# Patient Record
Sex: Female | Born: 1965 | Race: Black or African American | Hispanic: No | Marital: Married | Smoking: Never smoker
Health system: Southern US, Community
[De-identification: ages and names within clinical notes are randomized; demographics above are authoritative.]

## PROBLEM LIST (undated history)

## (undated) ENCOUNTER — Emergency Department: Admission: RE | Discharge status: Absent without leave | Source: Home / Self Care

## (undated) DIAGNOSIS — E119 Type 2 diabetes mellitus without complications: Secondary | ICD-10-CM

## (undated) DIAGNOSIS — C801 Malignant (primary) neoplasm, unspecified: Secondary | ICD-10-CM

## (undated) DIAGNOSIS — R42 Dizziness and giddiness: Secondary | ICD-10-CM

## (undated) DIAGNOSIS — F32A Depression, unspecified: Secondary | ICD-10-CM

## (undated) DIAGNOSIS — N289 Disorder of kidney and ureter, unspecified: Secondary | ICD-10-CM

## (undated) DIAGNOSIS — A6000 Herpesviral infection of urogenital system, unspecified: Secondary | ICD-10-CM

## (undated) DIAGNOSIS — G8929 Other chronic pain: Secondary | ICD-10-CM

## (undated) DIAGNOSIS — K649 Unspecified hemorrhoids: Secondary | ICD-10-CM

## (undated) DIAGNOSIS — M1990 Unspecified osteoarthritis, unspecified site: Secondary | ICD-10-CM

## (undated) DIAGNOSIS — I10 Essential (primary) hypertension: Secondary | ICD-10-CM

## (undated) DIAGNOSIS — J45909 Unspecified asthma, uncomplicated: Secondary | ICD-10-CM

## (undated) DIAGNOSIS — F419 Anxiety disorder, unspecified: Secondary | ICD-10-CM

## (undated) DIAGNOSIS — E039 Hypothyroidism, unspecified: Secondary | ICD-10-CM

## (undated) DIAGNOSIS — C9110 Chronic lymphocytic leukemia of B-cell type not having achieved remission: Secondary | ICD-10-CM

## (undated) DIAGNOSIS — F329 Major depressive disorder, single episode, unspecified: Secondary | ICD-10-CM

## (undated) DIAGNOSIS — M549 Dorsalgia, unspecified: Secondary | ICD-10-CM

## (undated) HISTORY — DX: Other chronic pain: G89.29

## (undated) HISTORY — DX: Dizziness and giddiness: R42

## (undated) HISTORY — PX: ABDOMINAL HYSTERECTOMY: SHX81

## (undated) HISTORY — DX: Dorsalgia, unspecified: M54.9

## (undated) HISTORY — DX: Herpesviral infection of urogenital system, unspecified: A60.00

## (undated) HISTORY — DX: Unspecified asthma, uncomplicated: J45.909

## (undated) HISTORY — DX: Unspecified hemorrhoids: K64.9

## (undated) HISTORY — PX: OOPHORECTOMY: SHX86

---

## 2007-04-27 HISTORY — PX: LAPAROSCOPIC SUPRACERVICAL HYSTERECTOMY: SUR797

## 2007-04-27 HISTORY — PX: BILATERAL SALPINGOOPHORECTOMY: SHX1223

## 2014-12-26 DIAGNOSIS — M1990 Unspecified osteoarthritis, unspecified site: Secondary | ICD-10-CM | POA: Insufficient documentation

## 2014-12-26 DIAGNOSIS — G5603 Carpal tunnel syndrome, bilateral upper limbs: Secondary | ICD-10-CM | POA: Insufficient documentation

## 2014-12-26 DIAGNOSIS — E119 Type 2 diabetes mellitus without complications: Secondary | ICD-10-CM | POA: Insufficient documentation

## 2014-12-26 DIAGNOSIS — E039 Hypothyroidism, unspecified: Secondary | ICD-10-CM | POA: Insufficient documentation

## 2014-12-26 DIAGNOSIS — E782 Mixed hyperlipidemia: Secondary | ICD-10-CM | POA: Insufficient documentation

## 2014-12-26 DIAGNOSIS — I10 Essential (primary) hypertension: Secondary | ICD-10-CM | POA: Insufficient documentation

## 2014-12-27 ENCOUNTER — Other Ambulatory Visit: Payer: Self-pay | Admitting: Adult Health

## 2014-12-27 DIAGNOSIS — Z1231 Encounter for screening mammogram for malignant neoplasm of breast: Secondary | ICD-10-CM

## 2014-12-31 ENCOUNTER — Inpatient Hospital Stay: Payer: Medicare Other

## 2014-12-31 ENCOUNTER — Inpatient Hospital Stay: Payer: Medicare Other | Attending: Oncology | Admitting: Oncology

## 2014-12-31 ENCOUNTER — Encounter: Payer: Self-pay | Admitting: Oncology

## 2014-12-31 VITALS — BP 155/97 | HR 80 | Temp 97.2°F | Wt 264.1 lb

## 2014-12-31 DIAGNOSIS — Z809 Family history of malignant neoplasm, unspecified: Secondary | ICD-10-CM | POA: Insufficient documentation

## 2014-12-31 DIAGNOSIS — R599 Enlarged lymph nodes, unspecified: Secondary | ICD-10-CM

## 2014-12-31 DIAGNOSIS — E1165 Type 2 diabetes mellitus with hyperglycemia: Secondary | ICD-10-CM | POA: Insufficient documentation

## 2014-12-31 DIAGNOSIS — R161 Splenomegaly, not elsewhere classified: Secondary | ICD-10-CM | POA: Insufficient documentation

## 2014-12-31 DIAGNOSIS — F419 Anxiety disorder, unspecified: Secondary | ICD-10-CM | POA: Diagnosis not present

## 2014-12-31 DIAGNOSIS — Z5111 Encounter for antineoplastic chemotherapy: Secondary | ICD-10-CM | POA: Diagnosis not present

## 2014-12-31 DIAGNOSIS — J45909 Unspecified asthma, uncomplicated: Secondary | ICD-10-CM | POA: Diagnosis not present

## 2014-12-31 DIAGNOSIS — D72829 Elevated white blood cell count, unspecified: Secondary | ICD-10-CM | POA: Diagnosis not present

## 2014-12-31 DIAGNOSIS — K429 Umbilical hernia without obstruction or gangrene: Secondary | ICD-10-CM | POA: Insufficient documentation

## 2014-12-31 DIAGNOSIS — G8929 Other chronic pain: Secondary | ICD-10-CM | POA: Diagnosis not present

## 2014-12-31 DIAGNOSIS — Z803 Family history of malignant neoplasm of breast: Secondary | ICD-10-CM | POA: Insufficient documentation

## 2014-12-31 DIAGNOSIS — J352 Hypertrophy of adenoids: Secondary | ICD-10-CM | POA: Diagnosis not present

## 2014-12-31 DIAGNOSIS — I10 Essential (primary) hypertension: Secondary | ICD-10-CM | POA: Diagnosis not present

## 2014-12-31 DIAGNOSIS — M549 Dorsalgia, unspecified: Secondary | ICD-10-CM | POA: Insufficient documentation

## 2014-12-31 DIAGNOSIS — C9110 Chronic lymphocytic leukemia of B-cell type not having achieved remission: Secondary | ICD-10-CM | POA: Insufficient documentation

## 2014-12-31 DIAGNOSIS — R918 Other nonspecific abnormal finding of lung field: Secondary | ICD-10-CM | POA: Insufficient documentation

## 2014-12-31 DIAGNOSIS — M5030 Other cervical disc degeneration, unspecified cervical region: Secondary | ICD-10-CM | POA: Diagnosis not present

## 2014-12-31 DIAGNOSIS — R42 Dizziness and giddiness: Secondary | ICD-10-CM | POA: Diagnosis not present

## 2014-12-31 DIAGNOSIS — Z79899 Other long term (current) drug therapy: Secondary | ICD-10-CM | POA: Diagnosis not present

## 2014-12-31 DIAGNOSIS — D649 Anemia, unspecified: Secondary | ICD-10-CM

## 2014-12-31 LAB — CBC WITH DIFFERENTIAL/PLATELET
Basophils Absolute: 0.1 10*3/uL (ref 0–0.1)
Basophils Relative: 0 %
EOS ABS: 0.3 10*3/uL (ref 0–0.7)
HCT: 35.1 % (ref 35.0–47.0)
Hemoglobin: 11.1 g/dL — ABNORMAL LOW (ref 12.0–16.0)
LYMPHS ABS: 59.8 10*3/uL — AB (ref 1.0–3.6)
MCH: 26.7 pg (ref 26.0–34.0)
MCHC: 31.7 g/dL — AB (ref 32.0–36.0)
MCV: 84.4 fL (ref 80.0–100.0)
MONO ABS: 1.5 10*3/uL — AB (ref 0.2–0.9)
Neutro Abs: 7.5 10*3/uL — ABNORMAL HIGH (ref 1.4–6.5)
Neutrophils Relative %: 11 %
PLATELETS: 267 10*3/uL (ref 150–440)
RBC: 4.16 MIL/uL (ref 3.80–5.20)
RDW: 14.7 % — AB (ref 11.5–14.5)
WBC: 69.1 10*3/uL — AB (ref 3.6–11.0)

## 2014-12-31 LAB — COMPREHENSIVE METABOLIC PANEL
ALT: 30 U/L (ref 14–54)
ANION GAP: 6 (ref 5–15)
AST: 32 U/L (ref 15–41)
Albumin: 4.1 g/dL (ref 3.5–5.0)
Alkaline Phosphatase: 76 U/L (ref 38–126)
BUN: 10 mg/dL (ref 6–20)
CHLORIDE: 104 mmol/L (ref 101–111)
CO2: 24 mmol/L (ref 22–32)
CREATININE: 0.72 mg/dL (ref 0.44–1.00)
Calcium: 9 mg/dL (ref 8.9–10.3)
Glucose, Bld: 249 mg/dL — ABNORMAL HIGH (ref 65–99)
POTASSIUM: 4 mmol/L (ref 3.5–5.1)
SODIUM: 134 mmol/L — AB (ref 135–145)
Total Bilirubin: 0.6 mg/dL (ref 0.3–1.2)
Total Protein: 7.3 g/dL (ref 6.5–8.1)

## 2014-12-31 MED ORDER — LORAZEPAM 1 MG PO TABS: 1.0000 mg | ORAL_TABLET | Freq: Once | ORAL | Status: DC

## 2015-01-01 NOTE — Progress Notes (Signed)
[facility]  [phone] [phone]  ID: Kandy Garrison OB: 1965-06-07  MR#: [id number]  [id number]  Patient Care Team: Minette Headland, NP as PCP - General (Hematology and Oncology)  CHIEF COMPLAINT:  Chief Complaint  Patient presents with  . New Evaluation  . Lymphadenopathy    INTERVAL HISTORY: Patient is a 49 year old female who recently moved to [place] from [place] and had baseline blood work while establishing care with primary care physician. She is found to have a significantly elevated white blood cell count. Patient states she remembers her white blood cell count being elevated several years ago. She also has new axillary and neck lymphadenopathy. She is anxious, but otherwise feels well. She has no neurologic complaints. She denies any fevers, night sweats, or weight loss. She has no chest pain or shortness of breath. She denies any nausea, vomiting, constipation, or diarrhea. She has no melanoma or hematochezia. She has no urinary complaints. Patient otherwise feels well and offers no further specific complaints.  REVIEW OF SYSTEMS:   Review of Systems  Constitutional: Negative for fever, weight loss and malaise/fatigue.  Respiratory: Negative.   Cardiovascular: Negative.   Gastrointestinal: Negative.   Musculoskeletal: Negative.   Neurological: Negative for weakness.  Psychiatric/Behavioral: The patient is nervous/anxious.     As per HPI. Otherwise, a complete review of systems is negatve.  PAST MEDICAL HISTORY: Past Medical History  Diagnosis Date  . Asthma   . Vertigo   . Chronic back pain     PAST SURGICAL HISTORY: Past Surgical History  Procedure Laterality Date  . Abdominal hysterectomy      FAMILY HISTORY Family History  Problem Relation Age of Onset  . Cancer Paternal Aunt        ADVANCED DIRECTIVES:    HEALTH MAINTENANCE: Social History  Substance Use Topics  . Smoking status: Never  Smoker   . Smokeless tobacco: Never Used  . Alcohol Use: 0.6 oz/week    1 Cans of beer per week     Colonoscopy:  PAP:  Bone density:  Lipid panel:  No Known Allergies  Current Outpatient Prescriptions  Medication Sig Dispense Refill  . cyclobenzaprine (FLEXERIL) 5 MG tablet Take by mouth.    . Diclofenac Sodium CR 100 MG 24 hr tablet Take by mouth.    . diltiazem (TIAZAC) 180 MG 24 hr capsule Take 180 mg by mouth daily.    Marland Kitchen ezetimibe (ZETIA) 10 MG tablet Take by mouth.    . fluticasone (FLONASE) 50 MCG/ACT nasal spray Place into the nose.    . furosemide (LASIX) 20 MG tablet Take by mouth.    . gabapentin (NEURONTIN) 600 MG tablet Take 600 mg by mouth at bedtime.    . irbesartan (AVAPRO) 300 MG tablet Take by mouth.    . levothyroxine (SYNTHROID, LEVOTHROID) 150 MCG tablet Take by mouth.    . lidocaine (LIDODERM) 5 % Place onto the skin.    . metFORMIN (GLUCOPHAGE) 1000 MG tablet Take by mouth.    . rosuvastatin (CRESTOR) 10 MG tablet Take by mouth.    . sitaGLIPtin (JANUVIA) 100 MG tablet Take by mouth.    Marland Kitchen LORazepam (ATIVAN) 1 MG tablet Take 1 tablet (1 mg total) by mouth once. Repeat x1 if necessary 30-60 mins prior to CT scan 2 tablet 0   No current facility-administered medications for this visit.    OBJECTIVE: Filed Vitals:   12/31/14 1100  BP: 155/97  Pulse: 80  Temp: 97.2  F (36.2 C)     There is no height on file to calculate BMI.    ECOG FS:0 - Asymptomatic  General: Well-developed, well-nourished, no acute distress. Eyes: Pink conjunctiva, anicteric sclera. HEENT: Normocephalic, moist mucous membranes, clear oropharnyx. Lungs: Clear to auscultation bilaterally. Heart: Regular rate and rhythm. No rubs, murmurs, or gallops. Abdomen: Soft, nontender, nondistended. No organomegaly noted, normoactive bowel sounds. Musculoskeletal: No edema, cyanosis, or clubbing. Neuro: Alert, answering all questions appropriately. Cranial nerves grossly intact. Skin: No  rashes or petechiae noted. Psych: Normal affect. Lymphatics: Minimally palpable cervical, calvicular, and axillary LAD.   LAB RESULTS:  Lab Results  Component Value Date   NA 134* 12/31/2014   K 4.0 12/31/2014   CL 104 12/31/2014   CO2 24 12/31/2014   GLUCOSE 249* 12/31/2014   BUN 10 12/31/2014   CREATININE 0.72 12/31/2014   CALCIUM 9.0 12/31/2014   PROT 7.3 12/31/2014   ALBUMIN 4.1 12/31/2014   AST 32 12/31/2014   ALT 30 12/31/2014   ALKPHOS 76 12/31/2014   BILITOT 0.6 12/31/2014   GFRNONAA >60 12/31/2014   GFRAA >60 12/31/2014    Lab Results  Component Value Date   WBC 69.1* 12/31/2014   NEUTROABS 7.5* 12/31/2014   HGB 11.1* 12/31/2014   HCT 35.1 12/31/2014   MCV 84.4 12/31/2014   PLT 267 12/31/2014     STUDIES: No results found.  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  PLAN:    1. CLL: Patient has a significantly elevated white blood cell count with lymphocyte predominance. Patient states less than one year ago in [place] she remembers her white count being approximately 20. She also has new lymphadenopathy. Will send peripheral blood flow cytometry to confirm the diagnosis. We will also get CT scans of the neck, chest, abdomen, and pelvis to further evaluate her lymphadenopathy. Patient will return to clinic in 1 week to discuss her results and any treatment plan if necessary. 2. Anxiety: Patient was given a prescription for Ativan to take prior to her CT scan. 3. 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  Patient expressed understanding and was in agreement with this plan. She also understands that She can call clinic at any time with any questions, concerns, or complaints.   No matching staging information was found for the patient.  Lloyd Huger, MD   01/01/2015 9:14 AM

## 2015-01-02 LAB — COMP PANEL: LEUKEMIA/LYMPHOMA: Immunophenotypic Profile: 81

## 2015-01-03 ENCOUNTER — Ambulatory Visit
Admission: RE | Admit: 2015-01-03 | Discharge: 2015-01-03 | Disposition: A | Discharge status: Home / Self Care | Payer: Medicare Other | Source: Ambulatory Visit | Attending: Oncology | Admitting: Oncology

## 2015-01-03 ENCOUNTER — Ambulatory Visit
Admission: RE | Admit: 2015-01-03 | Discharge: 2015-01-03 | Disposition: A | Discharge status: Home / Self Care | Payer: Medicare Other | Source: Ambulatory Visit | Attending: Family Medicine | Admitting: Family Medicine

## 2015-01-03 DIAGNOSIS — C9110 Chronic lymphocytic leukemia of B-cell type not having achieved remission: Secondary | ICD-10-CM

## 2015-01-03 DIAGNOSIS — M4802 Spinal stenosis, cervical region: Secondary | ICD-10-CM | POA: Insufficient documentation

## 2015-01-03 DIAGNOSIS — M2578 Osteophyte, vertebrae: Secondary | ICD-10-CM | POA: Insufficient documentation

## 2015-01-03 DIAGNOSIS — J352 Hypertrophy of adenoids: Secondary | ICD-10-CM | POA: Diagnosis not present

## 2015-01-03 DIAGNOSIS — R911 Solitary pulmonary nodule: Secondary | ICD-10-CM | POA: Insufficient documentation

## 2015-01-03 DIAGNOSIS — Z1231 Encounter for screening mammogram for malignant neoplasm of breast: Secondary | ICD-10-CM | POA: Insufficient documentation

## 2015-01-03 HISTORY — DX: Type 2 diabetes mellitus without complications: E11.9

## 2015-01-03 HISTORY — DX: Essential (primary) hypertension: I10

## 2015-01-03 IMAGING — CT CT NECK W/ CM
4 of 5 series · 14 of 33 positions shown, 16 images · IV contrast (omnipaque)
Comparison: No prior

CLINICAL DATA: 49-year-old female with suspected leukemia.
Bilateral neck masses for 3 months. Initial encounter.

EXAM:
CT NECK WITH CONTRAST
TECHNIQUE: Multidetector CT imaging of the neck was performed using the
standard protocol following the bolus administration of intravenous
contrast.
CONTRAST:  125mL OMNIPAQUE IOHEXOL 300 MG/ML SOLN in conjunction
with contrast enhanced imaging of the chest, abdomen, and pelvis
reported separately.

[Series 9: axial · axial · 0.61mm/px · z∈[-754,-590]mm · 4 of 138 slices shown, 5 images]
[im 28/138  soft-tissue]
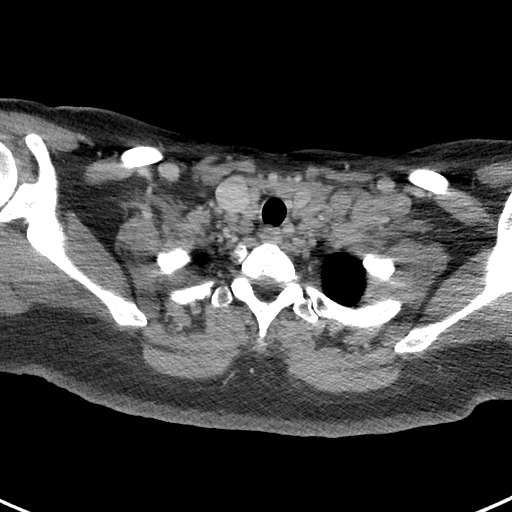
[im 28/138  bone]
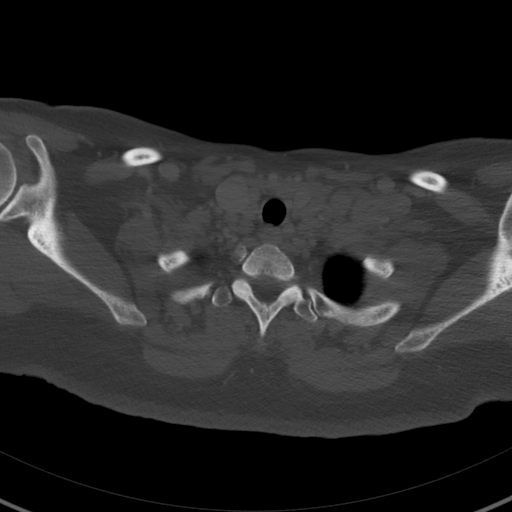
[im 55/138  bone]
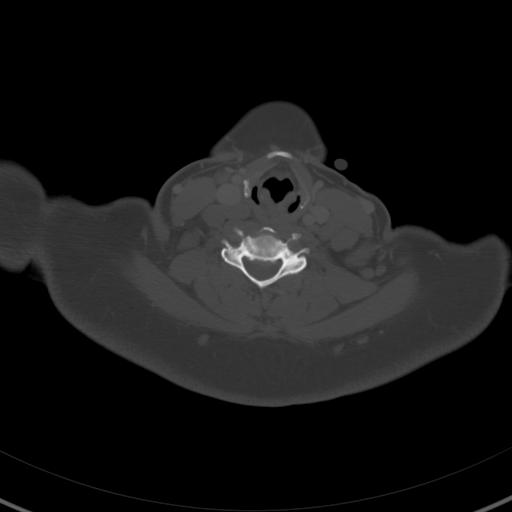
[im 83/138  bone]
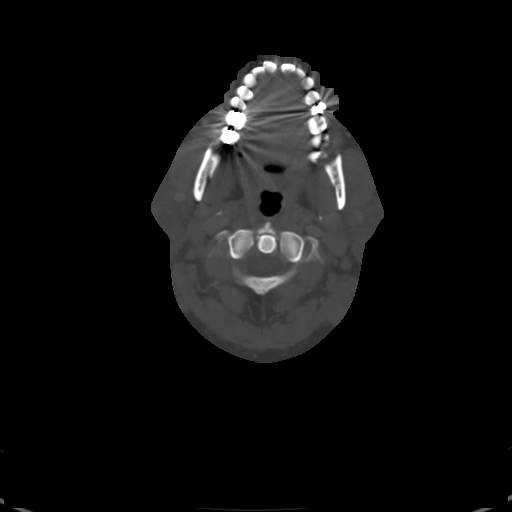
[im 110/138  bone]
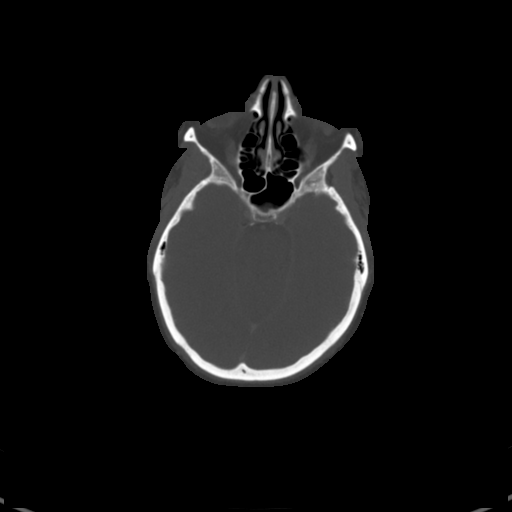

[Series 11: sag neck · sagittal · 0.46mm/px · 5 of 159 slices shown, 6 images]
[im 53/159  bone]
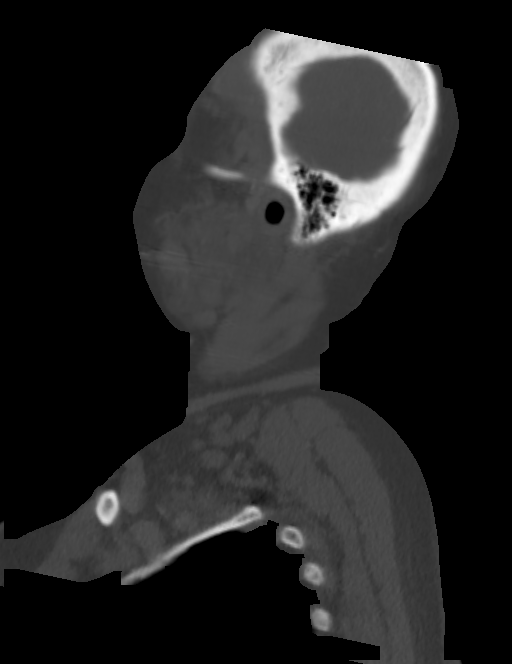
[im 66/159  bone]
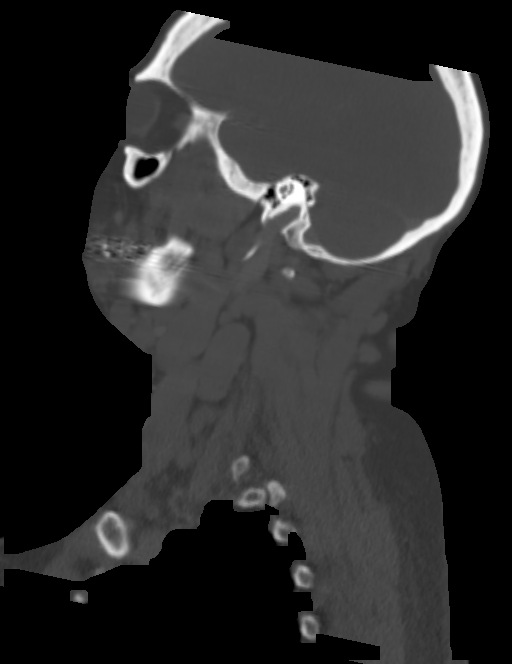
[im 80/159  soft-tissue]
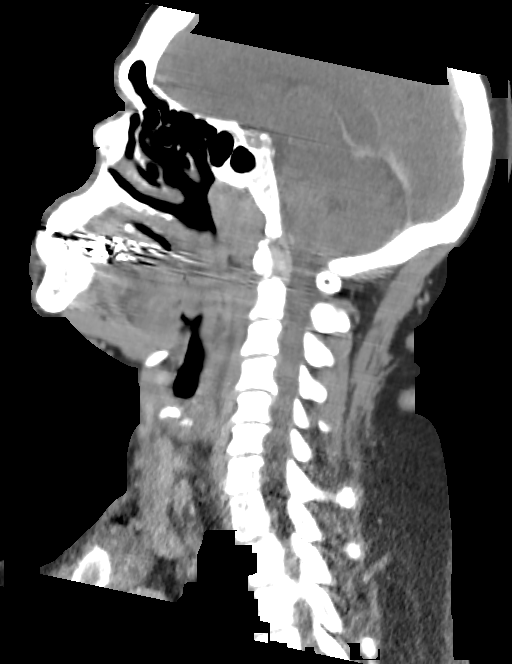
[im 80/159  bone]
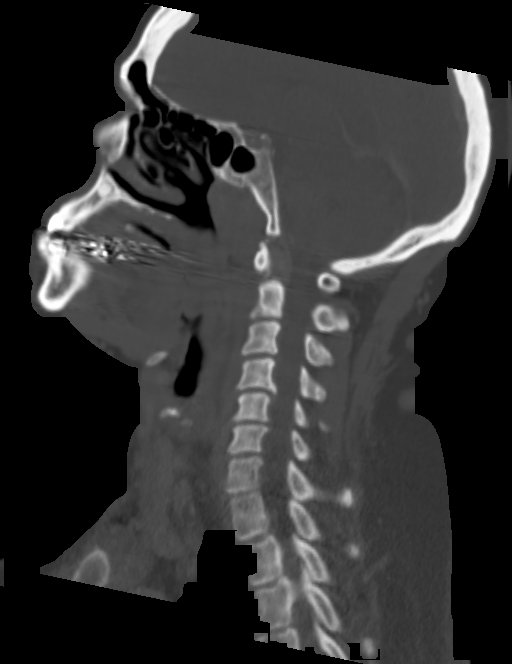
[im 93/159  bone]
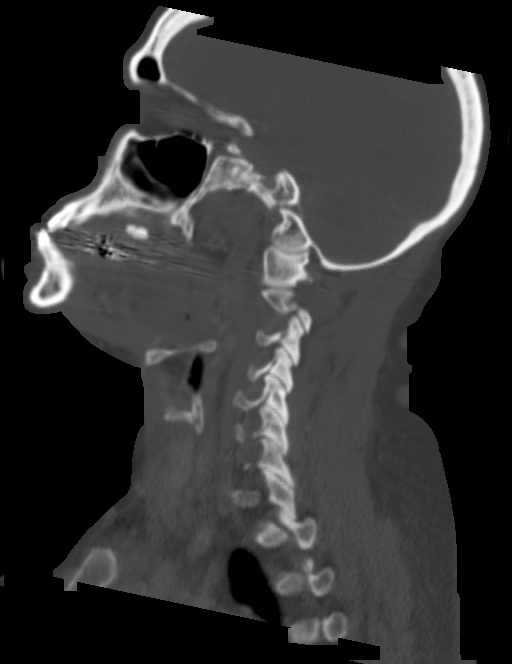
[im 106/159  bone]
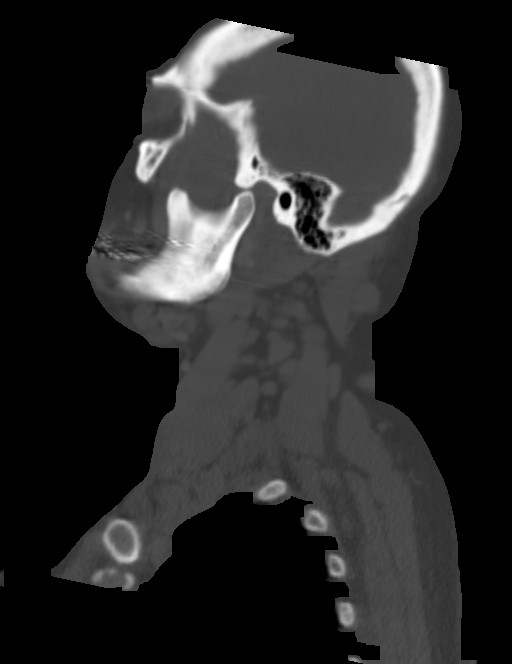

[Series 12: cor neck · coronal · 0.57mm/px · 3 of 116 slices shown]
[im 24/116  bone]
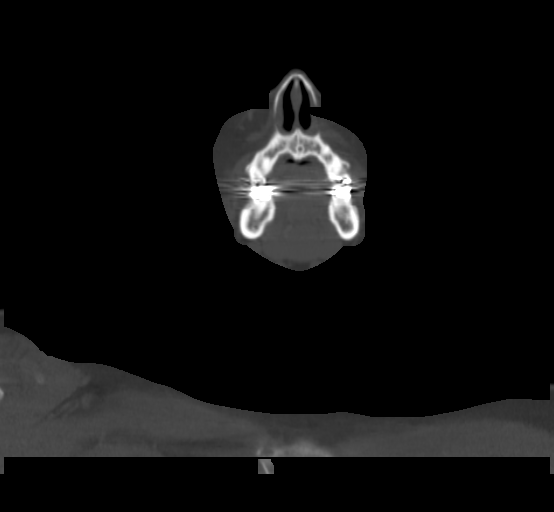
[im 47/116  bone]
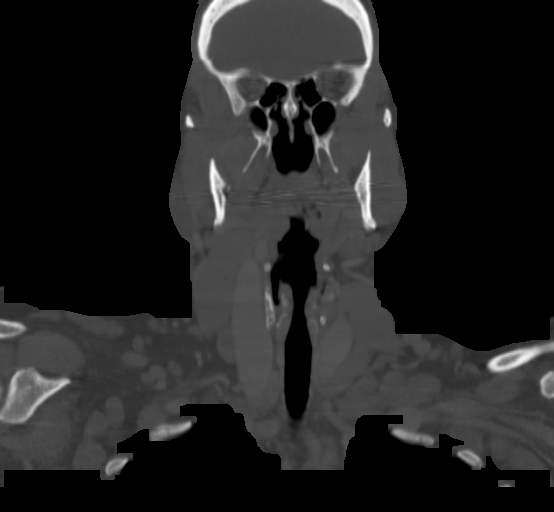
[im 70/116  bone]
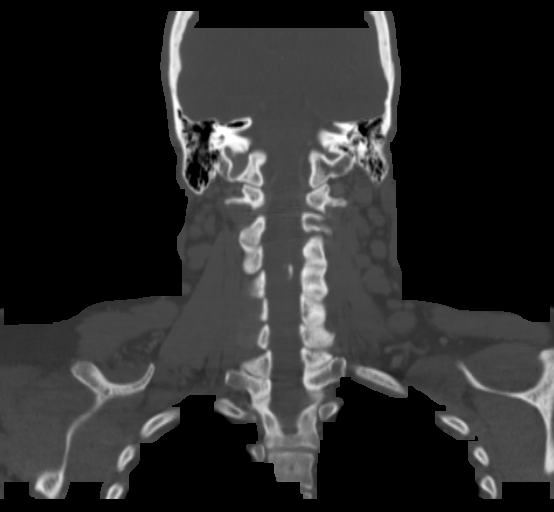

[Series 13: ax oropharynx · axial · 0.43mm/px · z∈[-785,-733]mm · 2 of 130 slices shown]
[im 26/130  bone]
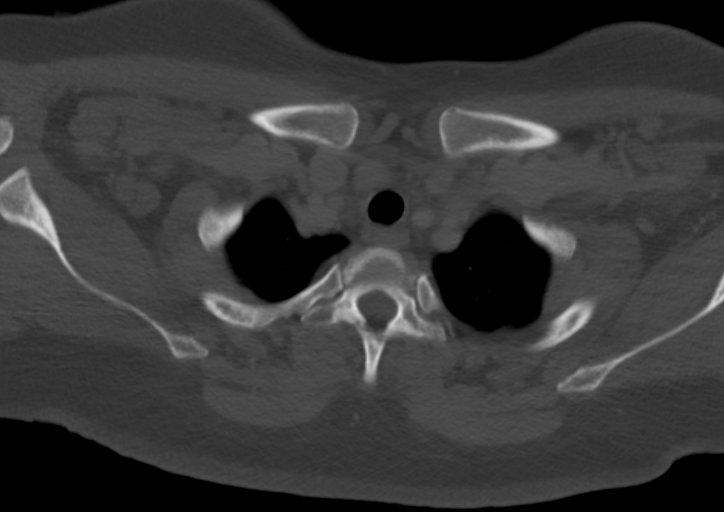
[im 52/130  bone]
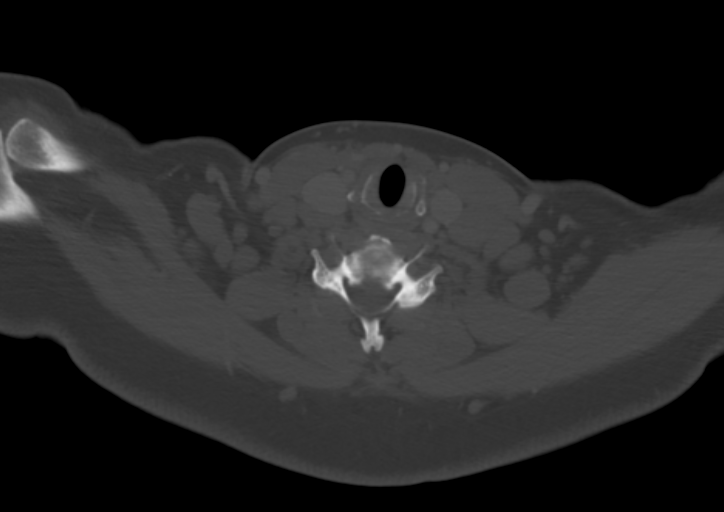

[14 of 33 positions shown; findings below may reference images not displayed]

FINDINGS: Pharynx and larynx: Negative larynx. Pharyngeal contours appear
symmetric. There is adenoid hypertrophy which seems advanced for
age. Palatine and lingual tonsils have a more normal appearance.
Negative parapharyngeal spaces. Negative retropharyngeal space.

Salivary glands: Submandibular glands, sublingual space, and parotid
glands are within normal limits. There are increased bilateral
parotid space lymph nodes, see details below.

Thyroid: Negative.

Lymph nodes: Diffuse bilateral lymphadenopathy. All nodal levels are
affected. Enlarged nodes in the right neck individually measure up
to 15 x 24 by 40 mm (AP by transverse by CC), while enlarged nodes
on the left individually measure up to 28 x 25 x 36 mm. Extensive
bilateral axillary and left greater than right thoracic inlet
lymphadenopathy also noted.

Vascular: Major vascular structures in the neck and at the skullbase
are patent.

Limited intracranial: Negative.

Visualized orbits: Negative.

Mastoids and visualized paranasal sinuses: Trace right maxillary
sinus mucosal thickening. Other paranasal sinuses are clear.

Skeleton: Degenerative changes in the cervical spine with bulky disc
osteophyte complex versus posterior longitudinal ligament
ossification (e.g. C3-C4 series 9, image 76). Subsequent multilevel
cervical spinal stenosis. No acute or suspicious osseous lesion
identified. Incidental torus palatini S.

Upper chest: Reported separately.
IMPRESSION: 1. Diffuse bilateral cervical lymphadenopathy in keeping with
lymphoma/leukemia. Adenoid hypertrophy greater than expected for age
might also reflect involvement by lymphoproliferative disease.
2. CT Chest, Abdomen and Pelvis from today reported separately.
3. Cervical spine degeneration with bulky disc osteophyte
degeneration and/or ossification of the posterior longitudinal
ligament (OPLL) resulting in multilevel cervical spinal stenosis.

## 2015-01-03 IMAGING — CT CT CHEST W/ CM
2 of 5 series · 14 of 46 positions shown, 16 images · IV contrast (omnipaque)
Comparison: Neck CT dictated same date, separate report. No prior
CT chest abdomen, or pelvis for comparison.

CLINICAL DATA: Weakness, bilateral neck masses for 2-3 months.
Patient had difficulty holding still for the examination.

EXAM:
CT CHEST, ABDOMEN, AND PELVIS WITH CONTRAST
TECHNIQUE: Multidetector CT imaging of the chest, abdomen and pelvis was
performed following the standard protocol during bolus
administration of intravenous contrast.
CONTRAST:  125mL OMNIPAQUE IOHEXOL 300 MG/ML  SOLN

[Series 3: cap with · axial · 0.81mm/px · z∈[-1312,-697]mm · 11 of 139 slices shown, 13 images]
[im 8/139  soft-tissue]
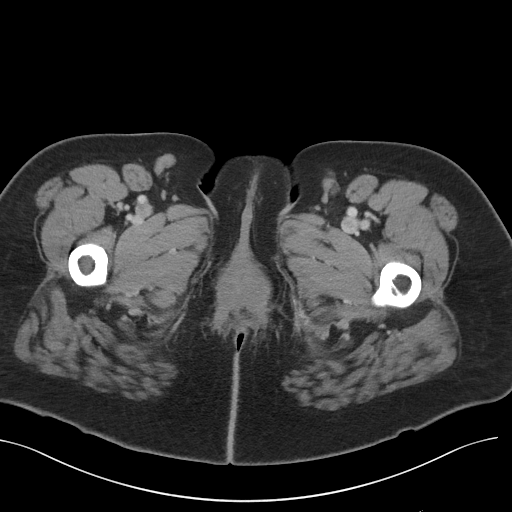
[im 8/139  bone]
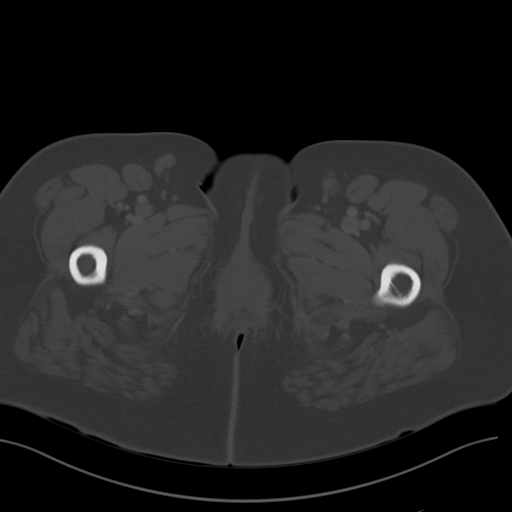
[im 24/139  soft-tissue]
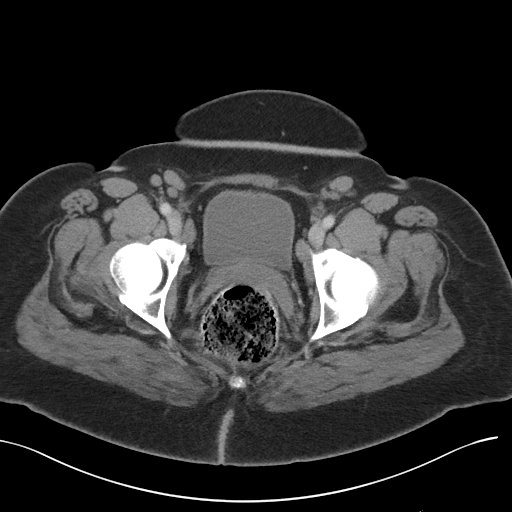
[im 31/139  soft-tissue]
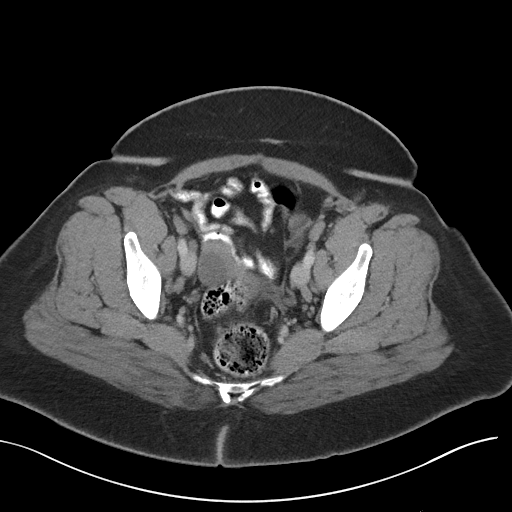
[im 47/139  soft-tissue]
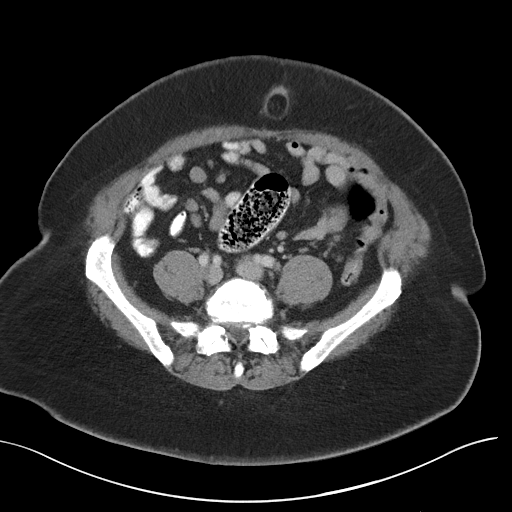
[im 54/139  soft-tissue]
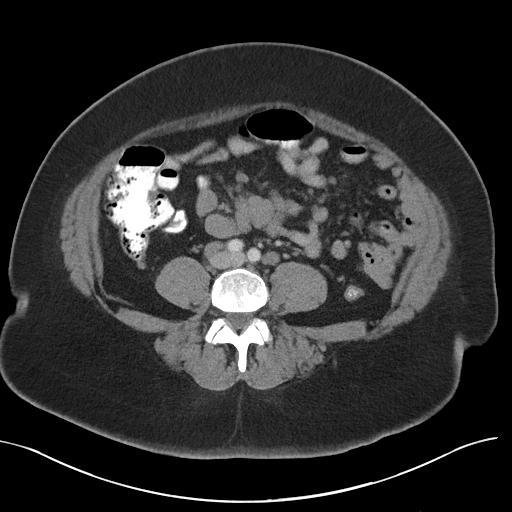
[im 70/139  soft-tissue]
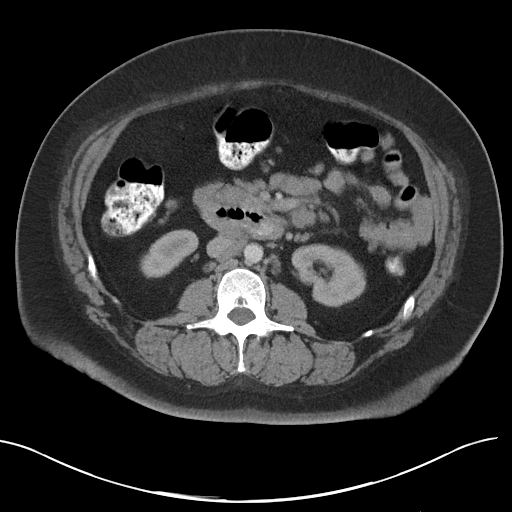
[im 85/139  soft-tissue]
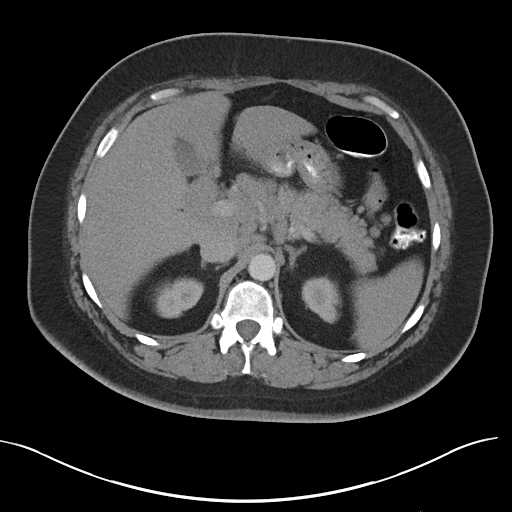
[im 93/139  soft-tissue]
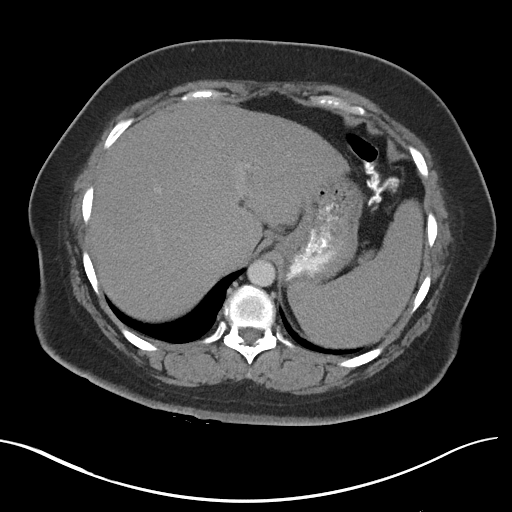
[im 108/139  soft-tissue]
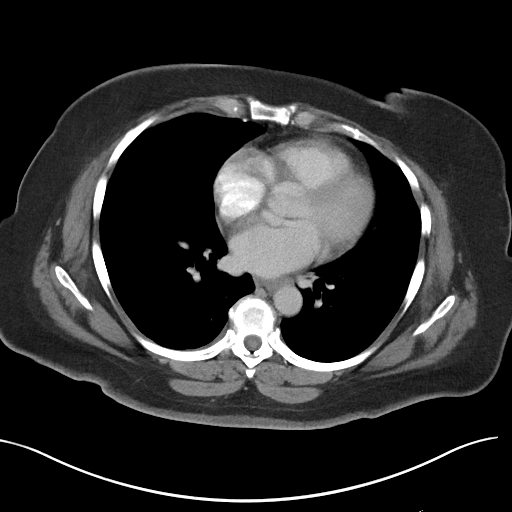
[im 108/139  bone]
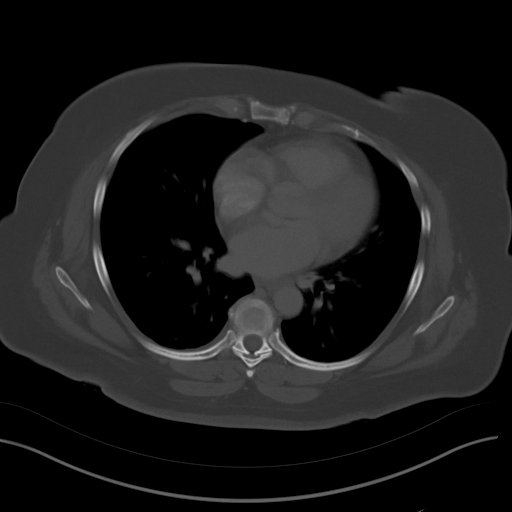
[im 116/139  soft-tissue]
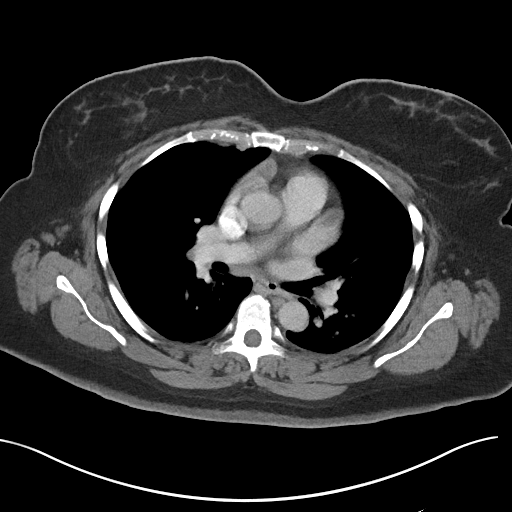
[im 131/139  soft-tissue]
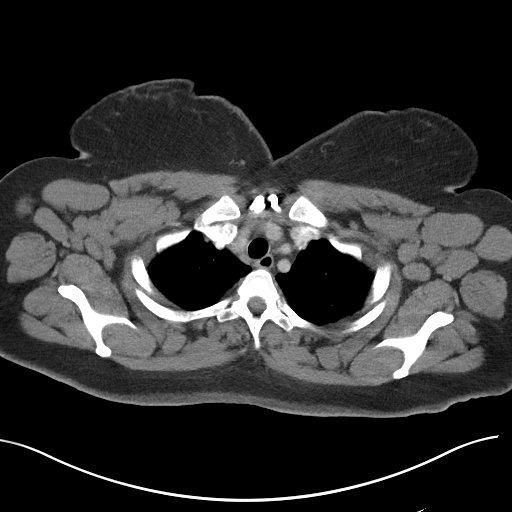

[Series 7: cor cap with · coronal · 0.85mm/px · 3 of 190 slices shown]
[im 64/190  soft-tissue]
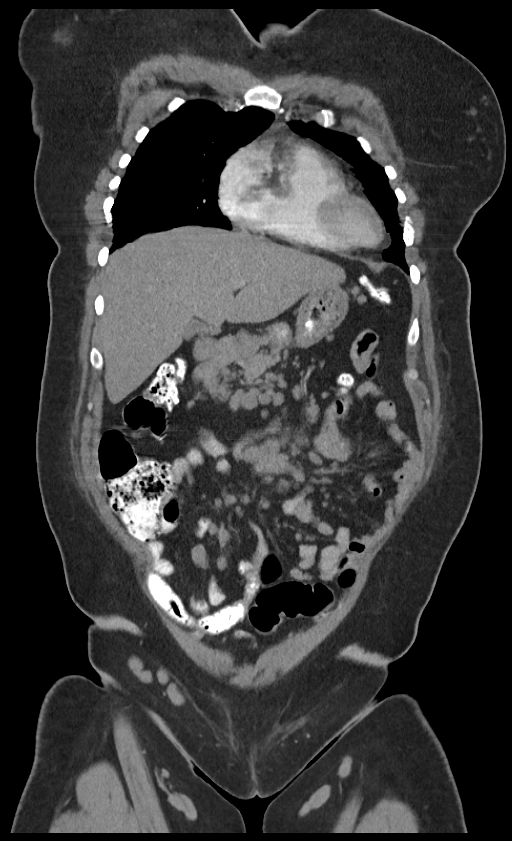
[im 85/190  soft-tissue]
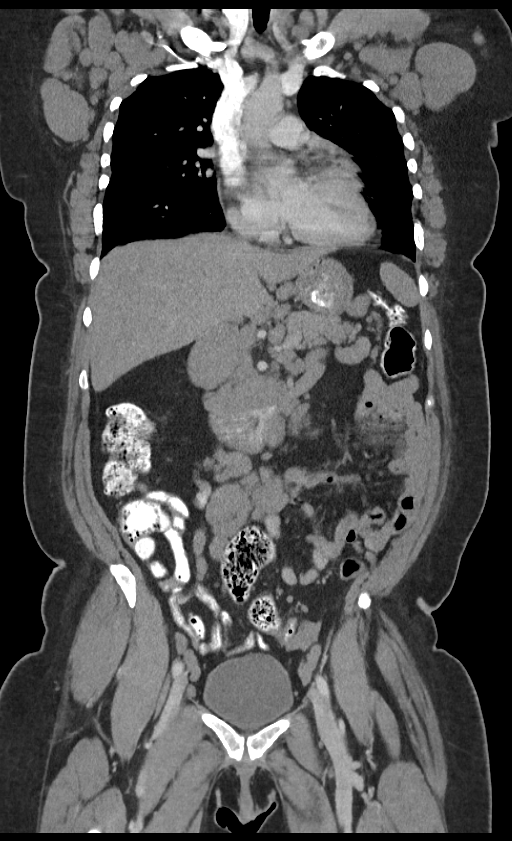
[im 106/190  soft-tissue]
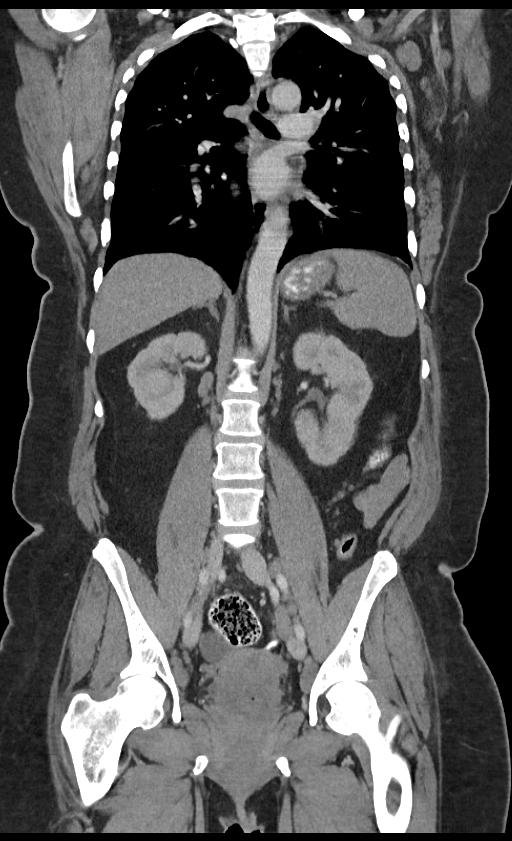

[14 of 46 positions shown; findings below may reference images not displayed]

FINDINGS: CT CHEST FINDINGS

Mediastinum/Nodes: Mild cardiac motion artifact. Heart size is
normal. Trace pericardial fluid.

Bulky bilateral axillary lymphadenopathy, representative right
axillary lymph node measuring 3.5 cm short axis diameter image 19
and representative left axillary lymph node short axis measuring
cm image 11.

Small AP window lymph nodes measure 4 mm and smaller. No hilar,
pretracheal, or subcarinal lymphadenopathy.

Great vessels are normal in caliber.

Lungs/Pleura: 6 mm pulmonary nodule in the right upper lobe image
20. No other measurable nodule, mass, or consolidation. Central
airways are patent.

Upper abdomen: Please see detailed report below.

Musculoskeletal: No acute osseous abnormality.

CT ABDOMEN AND PELVIS FINDINGS

Lower chest:  Please see dedicated report above.

Hepatobiliary: Liver and gallbladder appear unremarkable.

Pancreas: Normal

Spleen: Mild splenomegaly is identified, 13.4 cm image 48, without
focal abnormality.

Adrenals/Urinary Tract: Adrenal glands and kidneys are unremarkable.
No hydroureteronephrosis. Pelvic phleboliths are noted but no
radiopaque renal or ureteral calculus is identified. Bladder is
normal.

Stomach/Bowel: Normal appendix. No bowel wall thickening or focal
segmental dilatation is identified. A few colonic diverticuli are
identified without evidence for diverticulitis.

Vascular/Lymphatic: Multi station lymphadenopathy is noted as
follows:

Gastrohepatic node, 3.6 cm image 61.

Aortocaval node, 1.1 cm image 67.

Diffuse mesenteric lymphadenopathy, largest right lower quadrant
cm image 86.

Pelvic sidewall lymphadenopathy, largest 1.2 cm on the left image
113.

Inguinal lymphadenopathy, largest 2.1 cm on the left image 121.

No aortic aneurysm.

Other: Fat containing umbilical hernia.  No free air or fluid.

Evidence of hysterectomy.  Ovaries appear normal.

Musculoskeletal: No acute osseous abnormality or lytic or sclerotic
osseous lesion. L5-S1 disc degenerative change noted.
IMPRESSION: Multi station axillary, gastrohepatic, retroperitoneal, mesenteric,
pelvic, and inguinal lymphadenopathy as above. Findings are highly
suspicious for lymphoproliferative disorder such as lymphoma.

6 mm right upper lobe pulmonary parenchymal nodule. If the patient
is at high risk for bronchogenic carcinoma, follow-up chest CT at
6-12 months is recommended. If the patient is at low risk for
bronchogenic carcinoma, follow-up chest CT at 12 months is
recommended. This recommendation follows the consensus statement:
Guidelines for Management of Small Pulmonary Nodules Detected on CT
Scans: A Statement from the [HOSPITAL] as published in

## 2015-01-03 IMAGING — MG MM DIGITAL SCREENING BILAT W/ CAD
6 series · 6 of 6 positions shown · non-contrast
Comparison: No prior mammograms available for comparison.

CLINICAL DATA: Screening. Patient with known CLL.

EXAM:
DIGITAL SCREENING BILATERAL MAMMOGRAM WITH CAD

[R MLO]
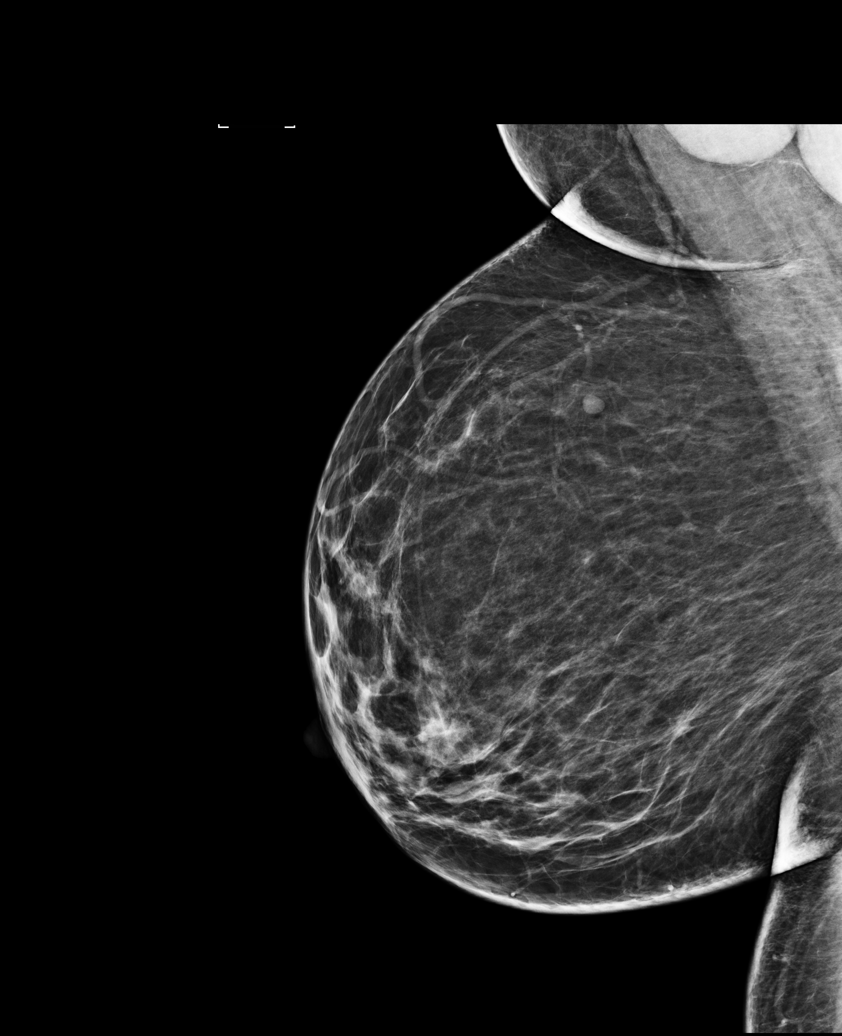

[R CC]
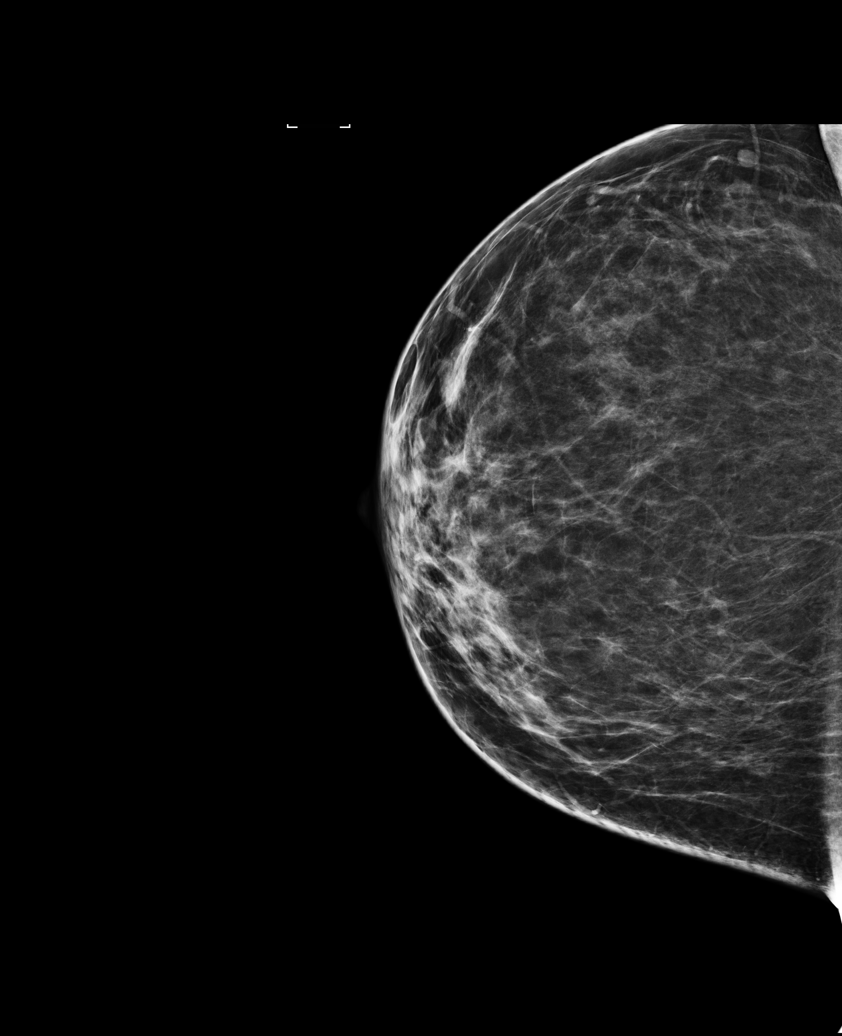

[L XCCL]
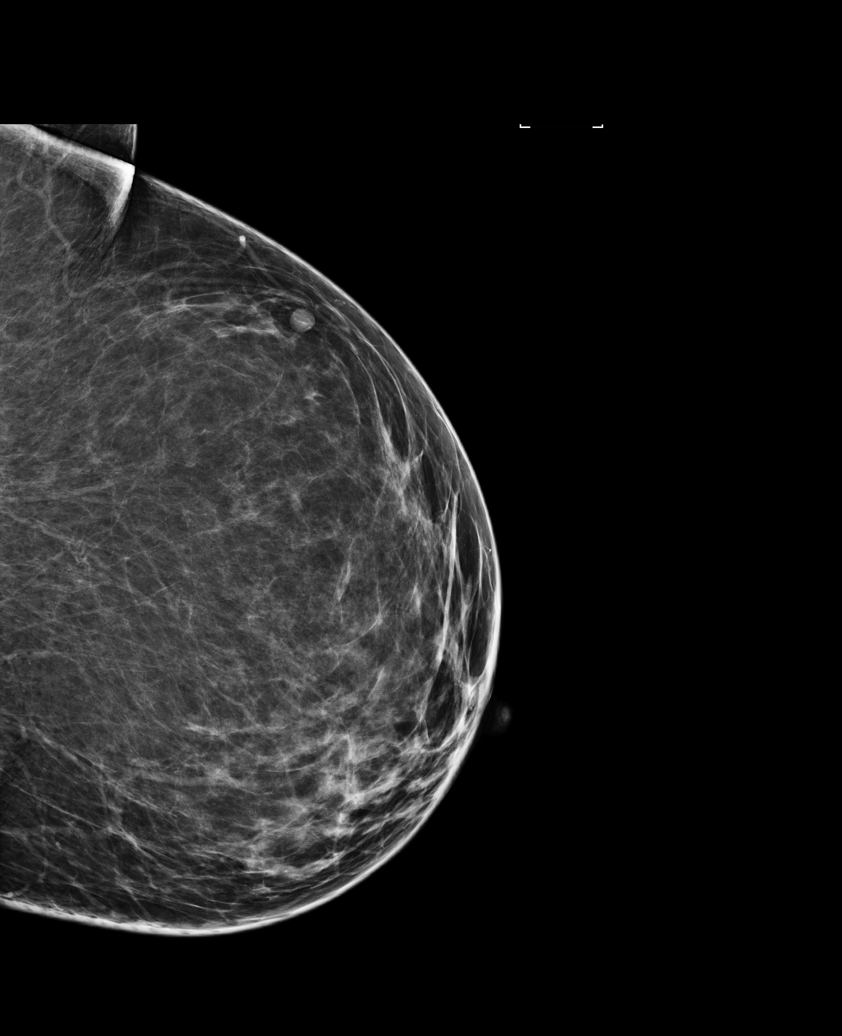

[L MLO]
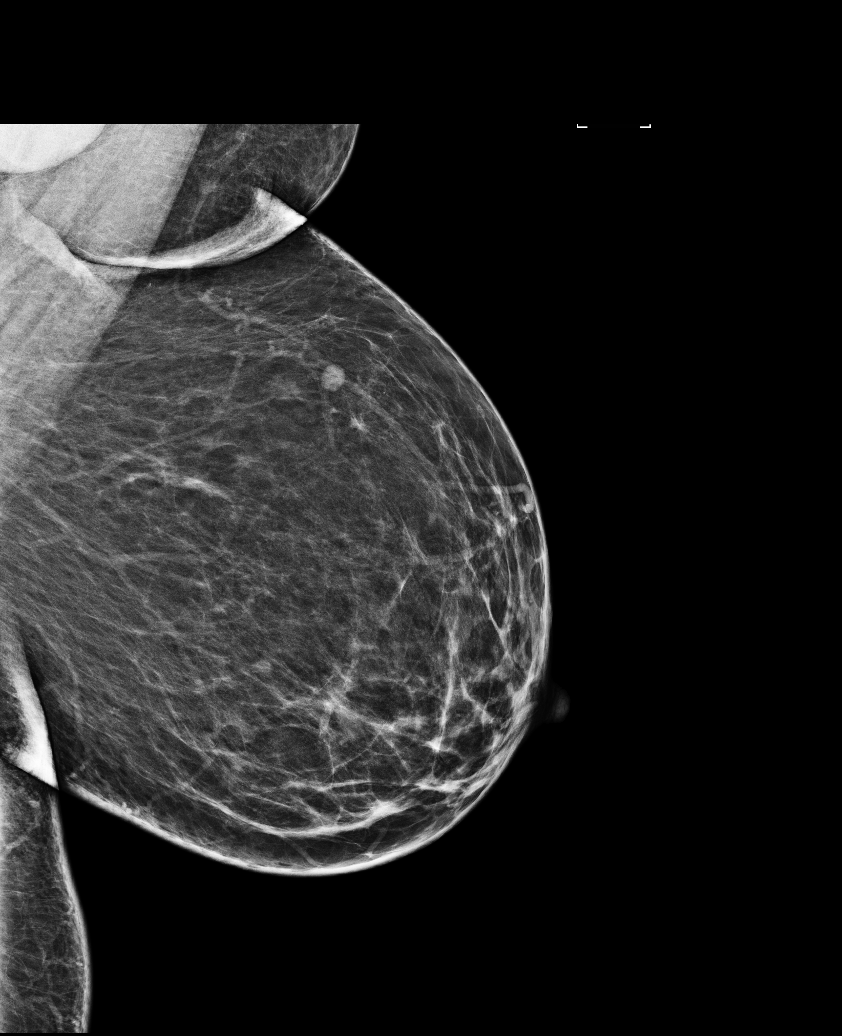

[R XCCL]
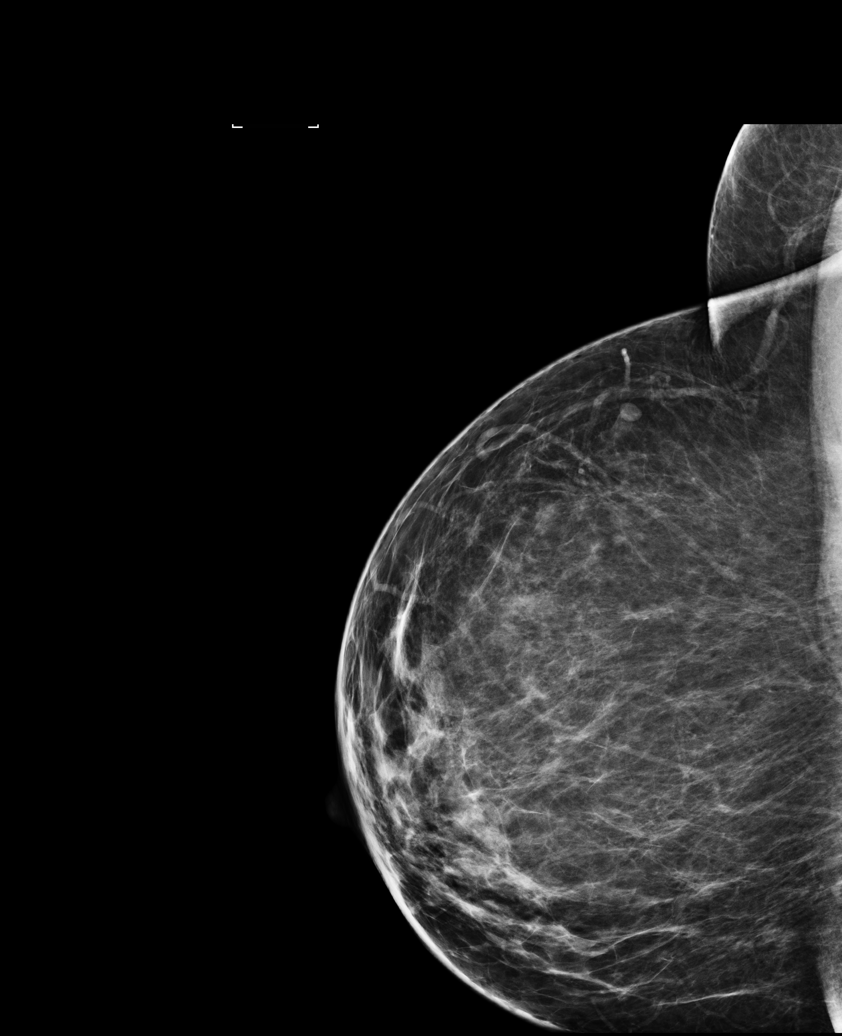

[L CC]
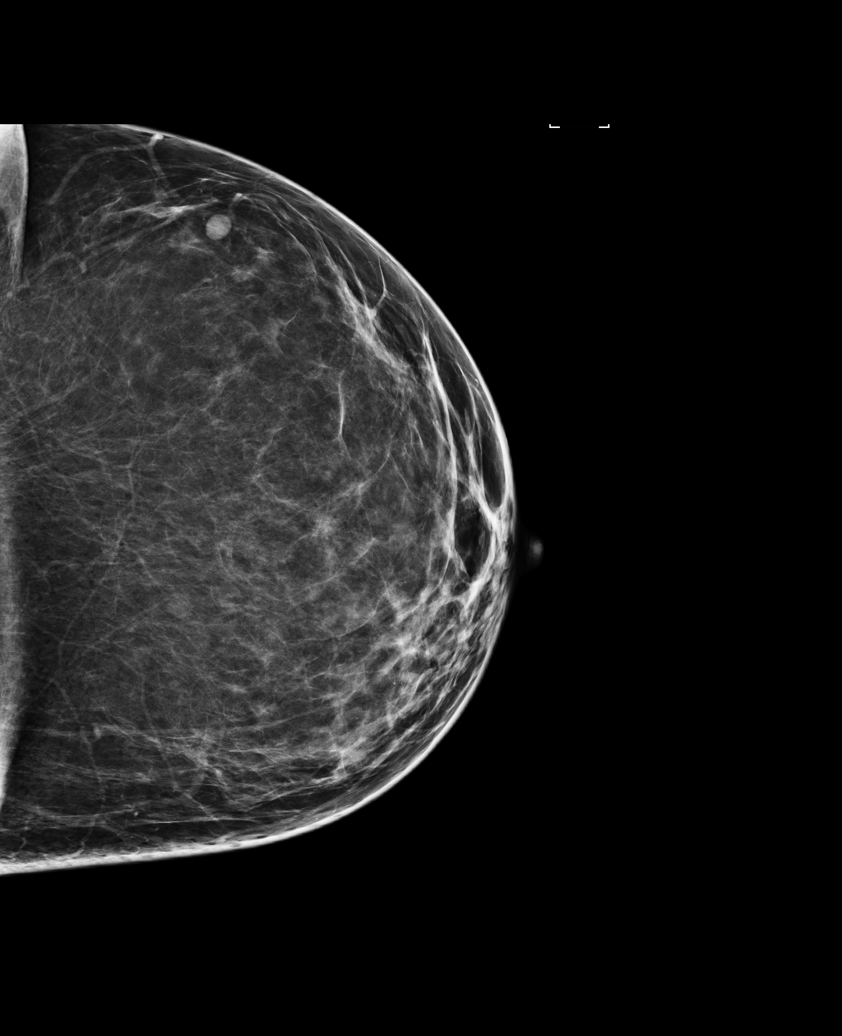

[6 of 6 positions shown; findings below may reference images not displayed]

[DATE] chest CT.

ACR Breast Density Category b: There are scattered areas of
fibroglandular density.
FINDINGS: Bulky bilateral axillary adenopathy again identified compatible with
this patient's known CLL.

There are no suspicious findings within either breast.

Images were processed with CAD.
IMPRESSION: No mammographic evidence of breast malignancy.

Bulky bilateral axillary lymphadenopathy again noted, compatible
with patient's known CLL.

A result letter of this screening mammogram will be mailed directly
to the patient.

RECOMMENDATION:
Screening mammogram in one year. (Code:[O1])

BI-RADS CATEGORY  6: Known malignancy.

## 2015-01-03 MED ORDER — IOHEXOL 300 MG/ML  SOLN
125.0000 mL | Freq: Once | INTRAMUSCULAR | Status: AC | PRN
  Administered 2015-01-03: 125 mL via INTRAVENOUS

## 2015-01-07 ENCOUNTER — Inpatient Hospital Stay (HOSPITAL_BASED_OUTPATIENT_CLINIC_OR_DEPARTMENT_OTHER): Payer: Medicare Other | Admitting: Oncology

## 2015-01-07 ENCOUNTER — Inpatient Hospital Stay: Payer: Medicare Other

## 2015-01-07 VITALS — BP 170/90 | HR 71 | Temp 98.0°F | Resp 20 | Wt 263.7 lb

## 2015-01-07 DIAGNOSIS — J352 Hypertrophy of adenoids: Secondary | ICD-10-CM

## 2015-01-07 DIAGNOSIS — R599 Enlarged lymph nodes, unspecified: Secondary | ICD-10-CM | POA: Diagnosis not present

## 2015-01-07 DIAGNOSIS — D72829 Elevated white blood cell count, unspecified: Secondary | ICD-10-CM

## 2015-01-07 DIAGNOSIS — E1165 Type 2 diabetes mellitus with hyperglycemia: Secondary | ICD-10-CM | POA: Diagnosis not present

## 2015-01-07 DIAGNOSIS — F419 Anxiety disorder, unspecified: Secondary | ICD-10-CM

## 2015-01-07 DIAGNOSIS — R42 Dizziness and giddiness: Secondary | ICD-10-CM

## 2015-01-07 DIAGNOSIS — Z79899 Other long term (current) drug therapy: Secondary | ICD-10-CM

## 2015-01-07 DIAGNOSIS — D649 Anemia, unspecified: Secondary | ICD-10-CM

## 2015-01-07 DIAGNOSIS — C9110 Chronic lymphocytic leukemia of B-cell type not having achieved remission: Secondary | ICD-10-CM

## 2015-01-07 DIAGNOSIS — Z803 Family history of malignant neoplasm of breast: Secondary | ICD-10-CM

## 2015-01-07 DIAGNOSIS — Z5111 Encounter for antineoplastic chemotherapy: Secondary | ICD-10-CM | POA: Diagnosis not present

## 2015-01-07 DIAGNOSIS — M549 Dorsalgia, unspecified: Secondary | ICD-10-CM

## 2015-01-07 DIAGNOSIS — Z809 Family history of malignant neoplasm, unspecified: Secondary | ICD-10-CM

## 2015-01-07 DIAGNOSIS — G8929 Other chronic pain: Secondary | ICD-10-CM

## 2015-01-07 DIAGNOSIS — R161 Splenomegaly, not elsewhere classified: Secondary | ICD-10-CM

## 2015-01-07 DIAGNOSIS — K429 Umbilical hernia without obstruction or gangrene: Secondary | ICD-10-CM

## 2015-01-07 DIAGNOSIS — M5030 Other cervical disc degeneration, unspecified cervical region: Secondary | ICD-10-CM

## 2015-01-07 DIAGNOSIS — J45909 Unspecified asthma, uncomplicated: Secondary | ICD-10-CM

## 2015-01-07 DIAGNOSIS — I10 Essential (primary) hypertension: Secondary | ICD-10-CM

## 2015-01-07 DIAGNOSIS — R918 Other nonspecific abnormal finding of lung field: Secondary | ICD-10-CM

## 2015-01-07 NOTE — Patient Instructions (Signed)
Rituximab injection What is this medicine? RITUXIMAB (ri TUX i mab) is a monoclonal antibody. This medicine changes the way the body's immune system works. It is used commonly to treat non-Hodgkin's lymphoma and other conditions. In cancer cells, this drug targets a specific protein within cancer cells and stops the cancer cells from growing. It is also used to treat rhuematoid arthritis (RA). In RA, this medicine slow the inflammatory process and help reduce joint pain and swelling. This medicine is often used with other cancer or arthritis medications. This medicine may be used for other purposes; ask your health care provider or pharmacist if you have questions. COMMON BRAND NAME(S): Rituxan What should I tell my health care provider before I take this medicine? They need to know if you have any of these conditions: -blood disorders -heart disease -history of hepatitis B -infection (especially a virus infection such as chickenpox, cold sores, or herpes) -irregular heartbeat -kidney disease -lung or breathing disease, like asthma -lupus -an unusual or allergic reaction to rituximab, mouse proteins, other medicines, foods, dyes, or preservatives -pregnant or trying to get pregnant -breast-feeding How should I use this medicine? This medicine is for infusion into a vein. It is administered in a hospital or clinic by a specially trained health care professional. A special MedGuide will be given to you by the pharmacist with each prescription and refill. Be sure to read this information carefully each time. Talk to your pediatrician regarding the use of this medicine in children. This medicine is not approved for use in children. Overdosage: If you think you have taken too much of this medicine contact a poison control center or emergency room at once. NOTE: This medicine is only for you. Do not share this medicine with others. What if I miss a dose? It is important not to miss a dose. Call  your doctor or health care professional if you are unable to keep an appointment. What may interact with this medicine? -cisplatin -medicines for blood pressure -some other medicines for arthritis -vaccines This list may not describe all possible interactions. Give your health care provider a list of all the medicines, herbs, non-prescription drugs, or dietary supplements you use. Also tell them if you smoke, drink alcohol, or use illegal drugs. Some items may interact with your medicine. What should I watch for while using this medicine? Report any side effects that you notice during your treatment right away, such as changes in your breathing, fever, chills, dizziness or lightheadedness. These effects are more common with the first dose. Visit your prescriber or health care professional for checks on your progress. You will need to have regular blood work. Report any other side effects. The side effects of this medicine can continue after you finish your treatment. Continue your course of treatment even though you feel ill unless your doctor tells you to stop. Call your doctor or health care professional for advice if you get a fever, chills or sore throat, or other symptoms of a cold or flu. Do not treat yourself. This drug decreases your body's ability to fight infections. Try to avoid being around people who are sick. This medicine may increase your risk to bruise or bleed. Call your doctor or health care professional if you notice any unusual bleeding. Be careful brushing and flossing your teeth or using a toothpick because you may get an infection or bleed more easily. If you have any dental work done, tell your dentist you are receiving this medicine. Avoid taking products   that contain aspirin, acetaminophen, ibuprofen, naproxen, or ketoprofen unless instructed by your doctor. These medicines may hide a fever. Do not become pregnant while taking this medicine. Women should inform their doctor  if they wish to become pregnant or think they might be pregnant. There is a potential for serious side effects to an unborn child. Talk to your health care professional or pharmacist for more information. Do not breast-feed an infant while taking this medicine. What side effects may I notice from receiving this medicine? Side effects that you should report to your doctor or health care professional as soon as possible: -allergic reactions like skin rash, itching or hives, swelling of the face, lips, or tongue -low blood counts - this medicine may decrease the number of white blood cells, red blood cells and platelets. You may be at increased risk for infections and bleeding. -signs of infection - fever or chills, cough, sore throat, pain or difficulty passing urine -signs of decreased platelets or bleeding - bruising, pinpoint red spots on the skin, black, tarry stools, blood in the urine -signs of decreased red blood cells - unusually weak or tired, fainting spells, lightheadedness -breathing problems -confused, not responsive -chest pain -fast, irregular heartbeat -feeling faint or lightheaded, falls -mouth sores -redness, blistering, peeling or loosening of the skin, including inside the mouth -stomach pain -swelling of the ankles, feet, or hands -trouble passing urine or change in the amount of urine Side effects that usually do not require medical attention (report to your doctor or other health care professional if they continue or are bothersome): -anxiety -headache -loss of appetite -muscle aches -nausea -night sweats This list may not describe all possible side effects. Call your doctor for medical advice about side effects. You may report side effects to FDA at 1-800-FDA-1088. Where should I keep my medicine? This drug is given in a hospital or clinic and will not be stored at home. NOTE: This sheet is a summary. It may not cover all possible information. If you have questions  about this medicine, talk to your doctor, pharmacist, or health care provider.  2015, [employer]. (2007-12-11 14:04:59) Bendamustine Injection What is this medicine? BENDAMUSTINE (BEN da MUS teen) is a chemotherapy drug. It is used to treat chronic lymphocytic leukemia and non-Hodgkin lymphoma. This medicine may be used for other purposes; ask your health care provider or pharmacist if you have questions. COMMON BRAND NAME(S): Treanda What should I tell my health care provider before I take this medicine? They need to know if you have any of these conditions: -kidney disease -liver disease -an unusual or allergic reaction to bendamustine, mannitol, other medicines, foods, dyes, or preservatives -pregnant or trying to get pregnant -breast-feeding How should I use this medicine? This medicine is for infusion into a vein. It is given by a health care professional in a hospital or clinic setting. Talk to your pediatrician regarding the use of this medicine in children. Special care may be needed. Overdosage: If you think you have taken too much of this medicine contact a poison control center or emergency room at once. NOTE: This medicine is only for you. Do not share this medicine with others. What if I miss a dose? It is important not to miss your dose. Call your doctor or health care professional if you are unable to keep an appointment. What may interact with this medicine? Do not take this medicine with any of the following medications: -clozapine This medicine may also interact with the following medications: -  atazanavir -cimetidine -ciprofloxacin -enoxacin -fluvoxamine -medicines for seizures like carbamazepine and phenobarbital -mexiletine -rifampin -tacrine -thiabendazole -zileuton This list may not describe all possible interactions. Give your health care provider a list of all the medicines, herbs, non-prescription drugs, or dietary supplements you use. Also  tell them if you smoke, drink alcohol, or use illegal drugs. Some items may interact with your medicine. What should I watch for while using this medicine? Your condition will be monitored carefully while you are receiving this medicine. This drug may make you feel generally unwell. This is not uncommon, as chemotherapy can affect healthy cells as well as cancer cells. Report any side effects. Continue your course of treatment even though you feel ill unless your doctor tells you to stop. Call your doctor or health care professional for advice if you get a fever, chills or sore throat, or other symptoms of a cold or flu. Do not treat yourself. This drug decreases your body's ability to fight infections. Try to avoid being around people who are sick. This medicine may increase your risk to bruise or bleed. Call your doctor or health care professional if you notice any unusual bleeding. Be careful brushing and flossing your teeth or using a toothpick because you may get an infection or bleed more easily. If you have any dental work done, tell your dentist you are receiving this medicine. Avoid taking products that contain aspirin, acetaminophen, ibuprofen, naproxen, or ketoprofen unless instructed by your doctor. These medicines may hide a fever. Do not become pregnant while taking this medicine. Women should inform their doctor if they wish to become pregnant or think they might be pregnant. There is a potential for serious side effects to an unborn child. Men should inform their doctors if they wish to father a child. This medicine may lower sperm counts. Talk to your health care professional or pharmacist for more information. Do not breast-feed an infant while taking this medicine. What side effects may I notice from receiving this medicine? Side effects that you should report to your doctor or health care professional as soon as possible: -allergic reactions like skin rash, itching or hives, swelling  of the face, lips, or tongue -low blood counts - this medicine may decrease the number of white blood cells, red blood cells and platelets. You may be at increased risk for infections and bleeding. -signs of infection - fever or chills, cough, sore throat, pain or difficulty passing urine -signs of decreased platelets or bleeding - bruising, pinpoint red spots on the skin, black, tarry stools, blood in the urine -signs of decreased red blood cells - unusually weak or tired, fainting spells, lightheadedness -trouble passing urine or change in the amount of urine Side effects that usually do not require medical attention (report to your doctor or health care professional if they continue or are bothersome): -diarrhea This list may not describe all possible side effects. Call your doctor for medical advice about side effects. You may report side effects to FDA at 1-800-FDA-1088. Where should I keep my medicine? This drug is given in a hospital or clinic and will not be stored at home. NOTE: This sheet is a summary. It may not cover all possible information. If you have questions about this medicine, talk to your doctor, pharmacist, or health care provider.  2015, [employer]. (2011-07-09 14:15:47)

## 2015-01-08 LAB — HEPATITIS B SURFACE ANTIGEN: Hepatitis B Surface Ag: NEGATIVE

## 2015-01-08 LAB — HEPATITIS B SURFACE ANTIBODY, QUANTITATIVE

## 2015-01-08 LAB — HEPATITIS B CORE ANTIBODY, TOTAL: Hep B Core Total Ab: NEGATIVE

## 2015-01-09 ENCOUNTER — Ambulatory Visit: Payer: Self-pay

## 2015-01-15 ENCOUNTER — Inpatient Hospital Stay: Payer: Medicare Other

## 2015-01-15 ENCOUNTER — Inpatient Hospital Stay (HOSPITAL_BASED_OUTPATIENT_CLINIC_OR_DEPARTMENT_OTHER): Payer: Medicare Other | Admitting: Oncology

## 2015-01-15 VITALS — BP 120/78 | HR 88 | Temp 97.8°F | Ht 72.01 in | Wt 259.5 lb

## 2015-01-15 VITALS — BP 122/78 | HR 70 | Temp 98.3°F | Resp 18

## 2015-01-15 DIAGNOSIS — R161 Splenomegaly, not elsewhere classified: Secondary | ICD-10-CM

## 2015-01-15 DIAGNOSIS — G8929 Other chronic pain: Secondary | ICD-10-CM

## 2015-01-15 DIAGNOSIS — D72829 Elevated white blood cell count, unspecified: Secondary | ICD-10-CM

## 2015-01-15 DIAGNOSIS — M549 Dorsalgia, unspecified: Secondary | ICD-10-CM

## 2015-01-15 DIAGNOSIS — Z809 Family history of malignant neoplasm, unspecified: Secondary | ICD-10-CM

## 2015-01-15 DIAGNOSIS — F419 Anxiety disorder, unspecified: Secondary | ICD-10-CM | POA: Diagnosis not present

## 2015-01-15 DIAGNOSIS — R918 Other nonspecific abnormal finding of lung field: Secondary | ICD-10-CM

## 2015-01-15 DIAGNOSIS — Z79899 Other long term (current) drug therapy: Secondary | ICD-10-CM

## 2015-01-15 DIAGNOSIS — E1165 Type 2 diabetes mellitus with hyperglycemia: Secondary | ICD-10-CM

## 2015-01-15 DIAGNOSIS — D649 Anemia, unspecified: Secondary | ICD-10-CM

## 2015-01-15 DIAGNOSIS — K429 Umbilical hernia without obstruction or gangrene: Secondary | ICD-10-CM

## 2015-01-15 DIAGNOSIS — C9110 Chronic lymphocytic leukemia of B-cell type not having achieved remission: Secondary | ICD-10-CM

## 2015-01-15 DIAGNOSIS — R42 Dizziness and giddiness: Secondary | ICD-10-CM

## 2015-01-15 DIAGNOSIS — J352 Hypertrophy of adenoids: Secondary | ICD-10-CM

## 2015-01-15 DIAGNOSIS — R599 Enlarged lymph nodes, unspecified: Secondary | ICD-10-CM

## 2015-01-15 DIAGNOSIS — Z803 Family history of malignant neoplasm of breast: Secondary | ICD-10-CM

## 2015-01-15 DIAGNOSIS — Z856 Personal history of leukemia: Secondary | ICD-10-CM | POA: Insufficient documentation

## 2015-01-15 DIAGNOSIS — Z5111 Encounter for antineoplastic chemotherapy: Secondary | ICD-10-CM | POA: Diagnosis not present

## 2015-01-15 DIAGNOSIS — J45909 Unspecified asthma, uncomplicated: Secondary | ICD-10-CM

## 2015-01-15 DIAGNOSIS — M5030 Other cervical disc degeneration, unspecified cervical region: Secondary | ICD-10-CM

## 2015-01-15 DIAGNOSIS — I10 Essential (primary) hypertension: Secondary | ICD-10-CM

## 2015-01-15 LAB — CBC WITH DIFFERENTIAL/PLATELET
BASOS ABS: 0.1 10*3/uL (ref 0–0.1)
Basophils Relative: 0 %
Eosinophils Absolute: 0.2 10*3/uL (ref 0–0.7)
HEMATOCRIT: 37.4 % (ref 35.0–47.0)
Hemoglobin: 11.7 g/dL — ABNORMAL LOW (ref 12.0–16.0)
LYMPHS ABS: 56.7 10*3/uL — AB (ref 1.0–3.6)
MCH: 26.4 pg (ref 26.0–34.0)
MCHC: 31.2 g/dL — ABNORMAL LOW (ref 32.0–36.0)
MCV: 84.6 fL (ref 80.0–100.0)
MONO ABS: 1.5 10*3/uL — AB (ref 0.2–0.9)
Monocytes Relative: 2 %
NEUTROS ABS: 7.7 10*3/uL — AB (ref 1.4–6.5)
Neutrophils Relative %: 12 %
Platelets: 262 10*3/uL (ref 150–440)
RBC: 4.41 MIL/uL (ref 3.80–5.20)
RDW: 14.8 % — AB (ref 11.5–14.5)
WBC: 66.2 10*3/uL (ref 3.6–11.0)

## 2015-01-15 LAB — COMPREHENSIVE METABOLIC PANEL
ALT: 30 U/L (ref 14–54)
AST: 35 U/L (ref 15–41)
Albumin: 4.5 g/dL (ref 3.5–5.0)
Alkaline Phosphatase: 90 U/L (ref 38–126)
Anion gap: 10 (ref 5–15)
BILIRUBIN TOTAL: 0.7 mg/dL (ref 0.3–1.2)
BUN: 13 mg/dL (ref 6–20)
CO2: 23 mmol/L (ref 22–32)
CREATININE: 0.86 mg/dL (ref 0.44–1.00)
Calcium: 9.2 mg/dL (ref 8.9–10.3)
Chloride: 103 mmol/L (ref 101–111)
Glucose, Bld: 266 mg/dL — ABNORMAL HIGH (ref 65–99)
POTASSIUM: 4.3 mmol/L (ref 3.5–5.1)
Sodium: 136 mmol/L (ref 135–145)
TOTAL PROTEIN: 7.8 g/dL (ref 6.5–8.1)

## 2015-01-15 MED ORDER — SODIUM CHLORIDE 0.9 % IV SOLN
375.0000 mg/m2 | Freq: Once | INTRAVENOUS | Status: AC
  Administered 2015-01-15: 900 mg via INTRAVENOUS
  Filled 2015-01-15: qty 20

## 2015-01-15 MED ORDER — SODIUM CHLORIDE 0.9 % IV SOLN
100.0000 mg/m2 | Freq: Once | INTRAVENOUS | Status: AC
  Administered 2015-01-15: 250 mg via INTRAVENOUS
  Filled 2015-01-15: qty 10

## 2015-01-15 MED ORDER — HYDROCORTISONE NA SUCCINATE PF 100 MG IJ SOLR
200.0000 mg | Freq: Once | INTRAMUSCULAR | Status: AC
Start: 2015-01-15 — End: 2015-01-15
  Administered 2015-01-15: 200 mg via INTRAVENOUS

## 2015-01-15 MED ORDER — DIPHENHYDRAMINE HCL 50 MG/ML IJ SOLN
25.0000 mg | Freq: Once | INTRAMUSCULAR | Status: AC
Start: 2015-01-15 — End: 2015-01-15
  Administered 2015-01-15: 25 mg via INTRAVENOUS

## 2015-01-15 MED ORDER — SODIUM CHLORIDE 0.9 % IV SOLN
Freq: Once | INTRAVENOUS | Status: AC
  Administered 2015-01-15: 12:00:00 via INTRAVENOUS
  Filled 2015-01-15: qty 1000

## 2015-01-15 MED ORDER — DIPHENHYDRAMINE HCL 25 MG PO CAPS
25.0000 mg | ORAL_CAPSULE | Freq: Once | ORAL | Status: AC
  Administered 2015-01-15: 25 mg via ORAL
  Filled 2015-01-15: qty 1

## 2015-01-15 MED ORDER — ACETAMINOPHEN 325 MG PO TABS
650.0000 mg | ORAL_TABLET | Freq: Once | ORAL | Status: AC
  Administered 2015-01-15: 650 mg via ORAL
  Filled 2015-01-15: qty 2

## 2015-01-15 MED ORDER — SODIUM CHLORIDE 0.9 % IV SOLN
Freq: Once | INTRAVENOUS | Status: AC
  Administered 2015-01-15: 12:00:00 via INTRAVENOUS
  Filled 2015-01-15: qty 4

## 2015-01-15 NOTE — Progress Notes (Signed)
[place]  [phone] [phone]  ID: Tonya Myers OB: Sep 24, 1965  MR#: [id number]  [id number]  Patient Care Team: Minette Headland, NP as PCP - General (Hematology and Oncology)  CHIEF COMPLAINT:  Chief Complaint  Patient presents with  . OTHER    CLL    INTERVAL HISTORY: Patient returns to clinic today to further discuss her diagnosis and treatment planning. She continues to be anxious, but otherwise feels well. She has no neurologic complaints. She denies any fevers, night sweats, or weight loss. She has no chest pain or shortness of breath. She denies any nausea, vomiting, constipation, or diarrhea. She has no melanoma or hematochezia. She has no urinary complaints. Patient offers no further specific complaints.  REVIEW OF SYSTEMS:   Review of Systems  Constitutional: Negative for fever, weight loss and malaise/fatigue.  Respiratory: Negative.   Cardiovascular: Negative.   Gastrointestinal: Negative.   Musculoskeletal: Negative.   Neurological: Negative for weakness.  Psychiatric/Behavioral: The patient is nervous/anxious.     As per HPI. Otherwise, a complete review of systems is negatve.  PAST MEDICAL HISTORY: Past Medical History  Diagnosis Date  . Asthma   . Vertigo   . Chronic back pain   . Hypertension   . Diabetes mellitus without complication     PAST SURGICAL HISTORY: Past Surgical History  Procedure Laterality Date  . Abdominal hysterectomy      FAMILY HISTORY Family History  Problem Relation Age of Onset  . Cancer Paternal Aunt   . Breast cancer Maternal Aunt 22       ADVANCED DIRECTIVES:    HEALTH MAINTENANCE: Social History  Substance Use Topics  . Smoking status: Never Smoker   . Smokeless tobacco: Never Used  . Alcohol Use: 0.6 oz/week    1 Cans of beer per week     Colonoscopy:  PAP:  Bone density:  Lipid panel:  No Known Allergies  Current Outpatient Prescriptions    Medication Sig Dispense Refill  . cyclobenzaprine (FLEXERIL) 5 MG tablet Take by mouth.    . Diclofenac Sodium CR 100 MG 24 hr tablet Take by mouth.    . diltiazem (TIAZAC) 180 MG 24 hr capsule Take 180 mg by mouth daily.    Marland Kitchen ezetimibe (ZETIA) 10 MG tablet Take by mouth.    . fluticasone (FLONASE) 50 MCG/ACT nasal spray Place into the nose.    . furosemide (LASIX) 20 MG tablet Take by mouth.    . gabapentin (NEURONTIN) 600 MG tablet Take 600 mg by mouth at bedtime.    . irbesartan (AVAPRO) 300 MG tablet Take by mouth.    . levothyroxine (SYNTHROID, LEVOTHROID) 150 MCG tablet Take by mouth.    . lidocaine (LIDODERM) 5 % Place onto the skin.    Marland Kitchen LORazepam (ATIVAN) 1 MG tablet Take 1 tablet (1 mg total) by mouth once. Repeat x1 if necessary 30-60 mins prior to CT scan 2 tablet 0  . metFORMIN (GLUCOPHAGE) 1000 MG tablet Take by mouth.    . rosuvastatin (CRESTOR) 10 MG tablet Take by mouth.    . sitaGLIPtin (JANUVIA) 100 MG tablet Take by mouth.     No current facility-administered medications for this visit.    OBJECTIVE: Filed Vitals:   01/07/15 1110  BP: 170/90  Pulse: 71  Temp: 98 F (36.7 C)  Resp: 20     There is no height on file to calculate BMI.    ECOG FS:0 - Asymptomatic  General: Well-developed, well-nourished,  no acute distress. Eyes: Pink conjunctiva, anicteric sclera. Lungs: Clear to auscultation bilaterally. Heart: Regular rate and rhythm. No rubs, murmurs, or gallops. Abdomen: Soft, nontender, nondistended. No organomegaly noted, normoactive bowel sounds. Musculoskeletal: No edema, cyanosis, or clubbing. Neuro: Alert, answering all questions appropriately. Cranial nerves grossly intact. Skin: No rashes or petechiae noted. Psych: Normal affect. Lymphatics: Minimally palpable cervical, calvicular, and axillary LAD.   LAB RESULTS:  Lab Results  Component Value Date   NA 134* 12/31/2014   K 4.0 12/31/2014   CL 104 12/31/2014   CO2 24 12/31/2014   GLUCOSE  249* 12/31/2014   BUN 10 12/31/2014   CREATININE 0.72 12/31/2014   CALCIUM 9.0 12/31/2014   PROT 7.3 12/31/2014   ALBUMIN 4.1 12/31/2014   AST 32 12/31/2014   ALT 30 12/31/2014   ALKPHOS 76 12/31/2014   BILITOT 0.6 12/31/2014   GFRNONAA >60 12/31/2014   GFRAA >60 12/31/2014    Lab Results  Component Value Date   WBC 69.1* 12/31/2014   NEUTROABS 7.5* 12/31/2014   HGB 11.1* 12/31/2014   HCT 35.1 12/31/2014   MCV 84.4 12/31/2014   PLT 267 12/31/2014     STUDIES: Ct Soft Tissue Neck W Contrast  01/03/2015   CLINICAL DATA:  49 year old female with suspected leukemia. Bilateral neck masses for 3 months. Initial encounter.  EXAM: CT NECK WITH CONTRAST  TECHNIQUE: Multidetector CT imaging of the neck was performed using the standard protocol following the bolus administration of intravenous contrast.  CONTRAST:  166mL OMNIPAQUE IOHEXOL 300 MG/ML SOLN in conjunction with contrast enhanced imaging of the chest, abdomen, and pelvis reported separately.  COMPARISON:  No prior  FINDINGS: Pharynx and larynx: Negative larynx. Pharyngeal contours appear symmetric. There is adenoid hypertrophy which seems advanced for age. Palatine and lingual tonsils have a more normal appearance. Negative parapharyngeal spaces. Negative retropharyngeal space.  Salivary glands: Submandibular glands, sublingual space, and parotid glands are within normal limits. There are increased bilateral parotid space lymph nodes, see details below.  Thyroid: Negative.  Lymph nodes: Diffuse bilateral lymphadenopathy. All nodal levels are affected. Enlarged nodes in the right neck individually measure up to 15 x 24 by 40 mm (AP by transverse by CC), while enlarged nodes on the left individually measure up to 28 x 25 x 36 mm. Extensive bilateral axillary and left greater than right thoracic inlet lymphadenopathy also noted.  Vascular: Major vascular structures in the neck and at the skullbase are patent.  Limited intracranial: Negative.   Visualized orbits: Negative.  Mastoids and visualized paranasal sinuses: Trace right maxillary sinus mucosal thickening. Other paranasal sinuses are clear.  Skeleton: Degenerative changes in the cervical spine with bulky disc osteophyte complex versus posterior longitudinal ligament ossification (e.g. C3-C4 series 9, image 76). Subsequent multilevel cervical spinal stenosis. No acute or suspicious osseous lesion identified. Incidental torus palatini S.  Upper chest: Reported separately.  IMPRESSION: 1. Diffuse bilateral cervical lymphadenopathy in keeping with lymphoma/leukemia. Adenoid hypertrophy greater than expected for age might also reflect involvement by lymphoproliferative disease. 2. CT Chest, Abdomen and Pelvis from today reported separately. 3. 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   Electronically Signed   By: Genevie Ann M.D.   On: 01/03/2015 15:23   Ct Chest W Contrast  01/03/2015   CLINICAL DATA:  Weakness, bilateral neck masses for 2-3 months. Patient had difficulty holding still for the examination.  EXAM: CT CHEST, ABDOMEN, AND  PELVIS WITH CONTRAST  TECHNIQUE: Multidetector CT imaging of the chest, abdomen and pelvis was performed following the standard protocol during bolus administration of intravenous contrast.  CONTRAST:  17mL OMNIPAQUE IOHEXOL 300 MG/ML  SOLN  COMPARISON:  Neck CT dictated same date, separate report. No prior CT chest abdomen, or pelvis for comparison.  FINDINGS: CT CHEST FINDINGS  Mediastinum/Nodes: Mild cardiac motion artifact. Heart size is normal. Trace pericardial fluid.  Bulky bilateral axillary lymphadenopathy, representative right axillary lymph node measuring 3.5 cm short axis diameter image 19 and representative left axillary lymph node short axis measuring 4.9 cm image 11.  Small AP window lymph nodes measure 4 mm and  smaller. No hilar, pretracheal, or subcarinal lymphadenopathy.  Great vessels are normal in caliber.  Lungs/Pleura: 6 mm pulmonary nodule in the right upper lobe image 20. No other measurable nodule, mass, or consolidation. Central airways are patent.  Upper abdomen: Please see detailed report below.  Musculoskeletal: No acute osseous abnormality.  CT ABDOMEN AND PELVIS FINDINGS  Lower chest:  Please see dedicated report above.  Hepatobiliary: Liver and gallbladder appear unremarkable.  Pancreas: Normal  Spleen: Mild splenomegaly is identified, 13.4 cm image 48, without focal abnormality.  Adrenals/Urinary Tract: Adrenal glands and kidneys are unremarkable. No hydroureteronephrosis. Pelvic phleboliths are noted but no radiopaque renal or ureteral calculus is identified. Bladder is normal.  Stomach/Bowel: Normal appendix. No bowel wall thickening or focal segmental dilatation is identified. A few colonic diverticuli are identified without evidence for diverticulitis.  Vascular/Lymphatic: Multi station lymphadenopathy is noted as follows:  Gastrohepatic node, 3.6 cm image 61.  Aortocaval node, 1.1 cm image 67.  Diffuse mesenteric lymphadenopathy, largest right lower quadrant 1.4 cm image 86.  Pelvic sidewall lymphadenopathy, largest 1.2 cm on the left image 113.  Inguinal lymphadenopathy, largest 2.1 cm on the left image 121.  No aortic aneurysm.  Other: Fat containing umbilical hernia.  No free air or fluid.  Evidence of hysterectomy.  Ovaries appear normal.  Musculoskeletal: No acute osseous abnormality or lytic or sclerotic osseous lesion. L5-S1 disc degenerative change noted.  IMPRESSION: Multi station axillary, gastrohepatic, retroperitoneal, mesenteric, pelvic, and inguinal lymphadenopathy as above. Findings are highly suspicious for lymphoproliferative disorder such as lymphoma.  6 mm right upper lobe pulmonary parenchymal nodule. If the patient is at high risk for bronchogenic carcinoma, follow-up chest CT at  6-12 months is recommended. If the patient is at low risk for bronchogenic carcinoma, follow-up chest CT at 12 months is recommended. This recommendation follows the consensus statement: Guidelines for Management of Small Pulmonary Nodules Detected on CT Scans: A Statement from the [place] as published in Radiology 2005;237:395-400.   Electronically Signed   By: Conchita Paris M.D.   On: 01/03/2015 16:55   Ct Abdomen Pelvis W Contrast  01/03/2015   CLINICAL DATA:  Weakness, bilateral neck masses for 2-3 months. Patient had difficulty holding still for the examination.  EXAM: CT CHEST, ABDOMEN, AND PELVIS WITH CONTRAST  TECHNIQUE: Multidetector CT imaging of the chest, abdomen and pelvis was performed following the standard protocol during bolus administration of intravenous contrast.  CONTRAST:  145mL OMNIPAQUE IOHEXOL 300 MG/ML  SOLN  COMPARISON:  Neck CT dictated same date, separate report. No prior CT chest abdomen, or pelvis for comparison.  FINDINGS: CT CHEST FINDINGS  Mediastinum/Nodes: Mild cardiac motion artifact. Heart size is normal. Trace pericardial fluid.  Bulky bilateral axillary lymphadenopathy, representative right axillary lymph node measuring 3.5 cm short axis diameter image 19 and representative left axillary lymph  node short axis measuring 4.9 cm image 11.  Small AP window lymph nodes measure 4 mm and smaller. No hilar, pretracheal, or subcarinal lymphadenopathy.  Great vessels are normal in caliber.  Lungs/Pleura: 6 mm pulmonary nodule in the right upper lobe image 20. No other measurable nodule, mass, or consolidation. Central airways are patent.  Upper abdomen: Please see detailed report below.  Musculoskeletal: No acute osseous abnormality.  CT ABDOMEN AND PELVIS FINDINGS  Lower chest:  Please see dedicated report above.  Hepatobiliary: Liver and gallbladder appear unremarkable.  Pancreas: Normal  Spleen: Mild splenomegaly is identified, 13.4 cm image 48, without focal  abnormality.  Adrenals/Urinary Tract: Adrenal glands and kidneys are unremarkable. No hydroureteronephrosis. Pelvic phleboliths are noted but no radiopaque renal or ureteral calculus is identified. Bladder is normal.  Stomach/Bowel: Normal appendix. No bowel wall thickening or focal segmental dilatation is identified. A few colonic diverticuli are identified without evidence for diverticulitis.  Vascular/Lymphatic: Multi station lymphadenopathy is noted as follows:  Gastrohepatic node, 3.6 cm image 61.  Aortocaval node, 1.1 cm image 67.  Diffuse mesenteric lymphadenopathy, largest right lower quadrant 1.4 cm image 86.  Pelvic sidewall lymphadenopathy, largest 1.2 cm on the left image 113.  Inguinal lymphadenopathy, largest 2.1 cm on the left image 121.  No aortic aneurysm.  Other: Fat containing umbilical hernia.  No free air or fluid.  Evidence of hysterectomy.  Ovaries appear normal.  Musculoskeletal: No acute osseous abnormality or lytic or sclerotic osseous lesion. L5-S1 disc degenerative change noted.  IMPRESSION: Multi station axillary, gastrohepatic, retroperitoneal, mesenteric, pelvic, and inguinal lymphadenopathy as above. Findings are highly suspicious for lymphoproliferative disorder such as lymphoma.  6 mm right upper lobe pulmonary parenchymal nodule. If the patient is at high risk for bronchogenic carcinoma, follow-up chest CT at 6-12 months is recommended. If the patient is at low risk for bronchogenic carcinoma, follow-up chest CT at 12 months is recommended. This recommendation follows the consensus statement: Guidelines for Management of Small Pulmonary Nodules Detected on CT Scans: A Statement from the [place] as published in Radiology 2005;237:395-400.   Electronically Signed   By: Conchita Paris M.D.   On: 01/03/2015 16:55    ASSESSMENT: CLL  PLAN:    1. CLL: CT scan results reviewed independently and reported as above with widespread lymphadenopathy and mild splenomegaly. CLL  confirmed by peripheral blood flow cytometry. Given her lymphadenopathy, patient will benefit from chemotherapy and will return to clinic in 1 week to initiate cycle 1 of 4 of Rituxan and Treanda. Patient will receive Rituxan and Treanda on day 1 and then Treanda only on day 2. This will be a 28 day cycle. Will reimage after 4 cycles.  2. Anxiety: Monitor, patient will require Ativan prior to her next CT scan. 3. Hyperglycemia: Continue current diabetic medications. Monitor closely with dexamethasone as premed.  Patient expressed understanding and was in agreement with this plan. She also understands that She can call clinic at any time with any questions, concerns, or complaints.   No matching staging information was found for the patient.  Lloyd Huger, MD   01/15/2015 9:15 AM

## 2015-01-15 NOTE — Progress Notes (Unsigned)
1248- Patient C/O SOB.  Rituxan stopped.    Pulse ox. 98% on RA.  Meds given. 1250- Pt vomited about 200 ml.  1255- Pt states feeling much better.  Denies any SOB.  Color good. Skin warm and dry.  Dr. Grayland Ormond at bedside.

## 2015-01-16 ENCOUNTER — Inpatient Hospital Stay: Payer: Medicare Other

## 2015-01-16 VITALS — BP 120/74 | HR 85 | Temp 98.0°F | Resp 18

## 2015-01-16 DIAGNOSIS — Z5111 Encounter for antineoplastic chemotherapy: Secondary | ICD-10-CM | POA: Diagnosis not present

## 2015-01-16 DIAGNOSIS — C9110 Chronic lymphocytic leukemia of B-cell type not having achieved remission: Secondary | ICD-10-CM

## 2015-01-16 MED ORDER — SODIUM CHLORIDE 0.9 % IV SOLN
Freq: Once | INTRAVENOUS | Status: AC
  Administered 2015-01-16: 09:00:00 via INTRAVENOUS
  Filled 2015-01-16: qty 1000

## 2015-01-16 MED ORDER — SODIUM CHLORIDE 0.9 % IV SOLN
Freq: Once | INTRAVENOUS | Status: AC
  Administered 2015-01-16: 10:00:00 via INTRAVENOUS
  Filled 2015-01-16: qty 4

## 2015-01-16 MED ORDER — SODIUM CHLORIDE 0.9 % IV SOLN
100.0000 mg/m2 | Freq: Once | INTRAVENOUS | Status: AC
  Administered 2015-01-16: 250 mg via INTRAVENOUS
  Filled 2015-01-16: qty 10

## 2015-01-20 LAB — MISC LABCORP TEST (SEND OUT): Labcorp test code: 489000

## 2015-01-22 ENCOUNTER — Inpatient Hospital Stay (HOSPITAL_BASED_OUTPATIENT_CLINIC_OR_DEPARTMENT_OTHER): Payer: Medicare Other | Admitting: Oncology

## 2015-01-22 ENCOUNTER — Inpatient Hospital Stay: Payer: Medicare Other

## 2015-01-22 DIAGNOSIS — G8929 Other chronic pain: Secondary | ICD-10-CM

## 2015-01-22 DIAGNOSIS — E1165 Type 2 diabetes mellitus with hyperglycemia: Secondary | ICD-10-CM | POA: Diagnosis not present

## 2015-01-22 DIAGNOSIS — Z79899 Other long term (current) drug therapy: Secondary | ICD-10-CM

## 2015-01-22 DIAGNOSIS — R161 Splenomegaly, not elsewhere classified: Secondary | ICD-10-CM

## 2015-01-22 DIAGNOSIS — I10 Essential (primary) hypertension: Secondary | ICD-10-CM

## 2015-01-22 DIAGNOSIS — D649 Anemia, unspecified: Secondary | ICD-10-CM

## 2015-01-22 DIAGNOSIS — R599 Enlarged lymph nodes, unspecified: Secondary | ICD-10-CM | POA: Diagnosis not present

## 2015-01-22 DIAGNOSIS — C9110 Chronic lymphocytic leukemia of B-cell type not having achieved remission: Secondary | ICD-10-CM | POA: Diagnosis not present

## 2015-01-22 DIAGNOSIS — M5030 Other cervical disc degeneration, unspecified cervical region: Secondary | ICD-10-CM

## 2015-01-22 DIAGNOSIS — D72829 Elevated white blood cell count, unspecified: Secondary | ICD-10-CM

## 2015-01-22 DIAGNOSIS — J352 Hypertrophy of adenoids: Secondary | ICD-10-CM

## 2015-01-22 DIAGNOSIS — J45909 Unspecified asthma, uncomplicated: Secondary | ICD-10-CM

## 2015-01-22 DIAGNOSIS — F419 Anxiety disorder, unspecified: Secondary | ICD-10-CM

## 2015-01-22 DIAGNOSIS — Z809 Family history of malignant neoplasm, unspecified: Secondary | ICD-10-CM

## 2015-01-22 DIAGNOSIS — K429 Umbilical hernia without obstruction or gangrene: Secondary | ICD-10-CM

## 2015-01-22 DIAGNOSIS — Z803 Family history of malignant neoplasm of breast: Secondary | ICD-10-CM

## 2015-01-22 DIAGNOSIS — Z5111 Encounter for antineoplastic chemotherapy: Secondary | ICD-10-CM | POA: Diagnosis not present

## 2015-01-22 DIAGNOSIS — R42 Dizziness and giddiness: Secondary | ICD-10-CM

## 2015-01-22 DIAGNOSIS — R918 Other nonspecific abnormal finding of lung field: Secondary | ICD-10-CM

## 2015-01-22 DIAGNOSIS — M549 Dorsalgia, unspecified: Secondary | ICD-10-CM

## 2015-01-22 LAB — CBC WITH DIFFERENTIAL/PLATELET
Basophils Absolute: 0 10*3/uL (ref 0–0.1)
Basophils Relative: 0 %
EOS ABS: 0.1 10*3/uL (ref 0–0.7)
EOS PCT: 1 %
HCT: 36.4 % (ref 35.0–47.0)
Hemoglobin: 12.1 g/dL (ref 12.0–16.0)
LYMPHS ABS: 4.5 10*3/uL — AB (ref 1.0–3.6)
LYMPHS PCT: 56 %
MCH: 27 pg (ref 26.0–34.0)
MCHC: 33.3 g/dL (ref 32.0–36.0)
MCV: 81.3 fL (ref 80.0–100.0)
MONO ABS: 0.6 10*3/uL (ref 0.2–0.9)
Monocytes Relative: 8 %
Neutro Abs: 2.7 10*3/uL (ref 1.4–6.5)
Neutrophils Relative %: 35 %
PLATELETS: 286 10*3/uL (ref 150–440)
RBC: 4.48 MIL/uL (ref 3.80–5.20)
RDW: 14.4 % (ref 11.5–14.5)
WBC: 8 10*3/uL (ref 3.6–11.0)

## 2015-01-22 LAB — COMPREHENSIVE METABOLIC PANEL
ALBUMIN: 3.9 g/dL (ref 3.5–5.0)
ALT: 32 U/L (ref 14–54)
AST: 26 U/L (ref 15–41)
Alkaline Phosphatase: 77 U/L (ref 38–126)
Anion gap: 8 (ref 5–15)
BILIRUBIN TOTAL: 0.8 mg/dL (ref 0.3–1.2)
BUN: 12 mg/dL (ref 6–20)
CHLORIDE: 102 mmol/L (ref 101–111)
CO2: 23 mmol/L (ref 22–32)
CREATININE: 0.86 mg/dL (ref 0.44–1.00)
Calcium: 8.5 mg/dL — ABNORMAL LOW (ref 8.9–10.3)
GFR calc Af Amer: >60 mL/min (ref >60)
GFR calc non Af Amer: >60 mL/min (ref >60)
GLUCOSE: 296 mg/dL — AB (ref 65–99)
POTASSIUM: 4 mmol/L (ref 3.5–5.1)
Sodium: 133 mmol/L — ABNORMAL LOW (ref 135–145)
Total Protein: 7 g/dL (ref 6.5–8.1)

## 2015-01-22 NOTE — Progress Notes (Signed)
Patient here for f/u after first treatment.  She felt great for the first 2 days after treatment but on the 3rd day she was weak to the point she did not feel like getting out of bed, no appetite, and excessive thirst that lasted 3 days.  Today is the first day she feels like she has energy.  Reports that her left side tooth pain is better and the hearing in her left ear has improved also.

## 2015-01-23 NOTE — Progress Notes (Signed)
[place]  Telephone:([phone]) [phone] Fax:(336) [phone]  ID: Tonya Myers OB: [birth year]  MR#: [id number]  [id number]  Patient Care Team: Minette Headland, NP as PCP - General (Hematology and Oncology)  CHIEF COMPLAINT:  Chief Complaint  Patient presents with  . OTHER    CLL    INTERVAL HISTORY: Patient returns to clinic today for further evaluation and initiation of cycle 1 of Rituxan plus Treanda. She continues to be anxious, but otherwise feels well. She has no neurologic complaints. She denies any fevers, night sweats, or weight loss. She has no chest pain or shortness of breath. She denies any nausea, vomiting, constipation, or diarrhea. She has no melanoma or hematochezia. She has no urinary complaints. Patient offers no specific complaints todayy.  REVIEW OF SYSTEMS:   Review of Systems  Constitutional: Negative for fever, weight loss and malaise/fatigue.  Respiratory: Negative.   Cardiovascular: Negative.   Gastrointestinal: Negative.   Musculoskeletal: Negative.   Neurological: Negative for weakness.  Psychiatric/Behavioral: The patient is nervous/anxious.     As per HPI. Otherwise, a complete review of systems is negatve.  PAST MEDICAL HISTORY: Past Medical History  Diagnosis Date  . Asthma   . Vertigo   . Chronic back pain   . Hypertension   . Diabetes mellitus without complication     PAST SURGICAL HISTORY: Past Surgical History  Procedure Laterality Date  . Abdominal hysterectomy      FAMILY HISTORY Family History  Problem Relation Age of Onset  . Cancer Paternal Aunt   . Breast cancer Maternal Aunt 47       ADVANCED DIRECTIVES:    HEALTH MAINTENANCE: Social History  Substance Use Topics  . Smoking status: Never Smoker   . Smokeless tobacco: Never Used  . Alcohol Use: 0.6 oz/week    1 Cans of beer per week     Colonoscopy:  PAP:  Bone density:  Lipid panel:  No Known Allergies  Current Outpatient  Prescriptions  Medication Sig Dispense Refill  . cyclobenzaprine (FLEXERIL) 5 MG tablet Take by mouth.    . Diclofenac Sodium CR 100 MG 24 hr tablet Take by mouth.    . diltiazem (TIAZAC) 180 MG 24 hr capsule Take 180 mg by mouth daily.    Marland Kitchen ezetimibe (ZETIA) 10 MG tablet Take by mouth.    . fluticasone (FLONASE) 50 MCG/ACT nasal spray Place into the nose.    . furosemide (LASIX) 20 MG tablet Take by mouth.    . gabapentin (NEURONTIN) 600 MG tablet Take 600 mg by mouth at bedtime.    . irbesartan (AVAPRO) 300 MG tablet Take by mouth.    . levothyroxine (SYNTHROID, LEVOTHROID) 150 MCG tablet Take by mouth.    . lidocaine (LIDODERM) 5 % Place onto the skin.    Marland Kitchen LORazepam (ATIVAN) 1 MG tablet Take 1 tablet (1 mg total) by mouth once. Repeat x1 if necessary 30-60 mins prior to CT scan 2 tablet 0  . metFORMIN (GLUCOPHAGE) 1000 MG tablet Take by mouth.    . rosuvastatin (CRESTOR) 10 MG tablet Take by mouth.    . sitaGLIPtin (JANUVIA) 100 MG tablet Take by mouth.    . dapagliflozin propanediol (FARXIGA) 5 MG TABS tablet Take by mouth.     No current facility-administered medications for this visit.    OBJECTIVE: Filed Vitals:   01/15/15 0936  BP: 120/78  Pulse: 88  Temp: 97.8 F (36.6 C)     Body mass index is 35.18 kg/(m^2).  ECOG FS:0 - Asymptomatic  General: Well-developed, well-nourished, no acute distress. Eyes: Pink conjunctiva, anicteric sclera. Lungs: Clear to auscultation bilaterally. Heart: Regular rate and rhythm. No rubs, murmurs, or gallops. Abdomen: Soft, nontender, nondistended. No organomegaly noted, normoactive bowel sounds. Musculoskeletal: No edema, cyanosis, or clubbing. Neuro: Alert, answering all questions appropriately. Cranial nerves grossly intact. Skin: No rashes or petechiae noted. Psych: Normal affect. Lymphatics: Palpable cervical, calvicular, and axillary LAD.   LAB RESULTS:  Lab Results  Component Value Date   NA 133* 01/22/2015   K 4.0  01/22/2015   CL 102 01/22/2015   CO2 23 01/22/2015   GLUCOSE 296* 01/22/2015   BUN 12 01/22/2015   CREATININE 0.86 01/22/2015   CALCIUM 8.5* 01/22/2015   PROT 7.0 01/22/2015   ALBUMIN 3.9 01/22/2015   AST 26 01/22/2015   ALT 32 01/22/2015   ALKPHOS 77 01/22/2015   BILITOT 0.8 01/22/2015   GFRNONAA >60 01/22/2015   GFRAA >60 01/22/2015    Lab Results  Component Value Date   WBC 8.0 01/22/2015   NEUTROABS 2.7 01/22/2015   HGB 12.1 01/22/2015   HCT 36.4 01/22/2015   MCV 81.3 01/22/2015   PLT 286 01/22/2015     STUDIES: Ct Soft Tissue Neck W Contrast  01/03/2015   CLINICAL DATA:  49 year old female with suspected leukemia. Bilateral neck masses for 3 months. Initial encounter.  EXAM: CT NECK WITH CONTRAST  TECHNIQUE: Multidetector CT imaging of the neck was performed using the standard protocol following the bolus administration of intravenous contrast.  CONTRAST:  11mL OMNIPAQUE IOHEXOL 300 MG/ML SOLN in conjunction with contrast enhanced imaging of the chest, abdomen, and pelvis reported separately.  COMPARISON:  No prior  FINDINGS: Pharynx and larynx: Negative larynx. Pharyngeal contours appear symmetric. There is adenoid hypertrophy which seems advanced for age. Palatine and lingual tonsils have a more normal appearance. Negative parapharyngeal spaces. Negative retropharyngeal space.  Salivary glands: Submandibular glands, sublingual space, and parotid glands are within normal limits. There are increased bilateral parotid space lymph nodes, see details below.  Thyroid: Negative.  Lymph nodes: Diffuse bilateral lymphadenopathy. All nodal levels are affected. Enlarged nodes in the right neck individually measure up to 15 x 24 by 40 mm (AP by transverse by CC), while enlarged nodes on the left individually measure up to 28 x 25 x 36 mm. Extensive bilateral axillary and left greater than right thoracic inlet lymphadenopathy also noted.  Vascular: Major vascular structures in the neck and at  the skullbase are patent.  Limited intracranial: Negative.  Visualized orbits: Negative.  Mastoids and visualized paranasal sinuses: Trace right maxillary sinus mucosal thickening. Other paranasal sinuses are clear.  Skeleton: Degenerative changes in the cervical spine with bulky disc osteophyte complex versus posterior longitudinal ligament ossification (e.g. C3-C4 series 9, image 76). Subsequent multilevel cervical spinal stenosis. No acute or suspicious osseous lesion identified. Incidental torus palatini S.  Upper chest: Reported separately.  IMPRESSION: 1. Diffuse bilateral cervical lymphadenopathy in keeping with lymphoma/leukemia. Adenoid hypertrophy greater than expected for age might also reflect involvement by lymphoproliferative disease. 2. CT Chest, Abdomen and Pelvis from today reported separately. 3. 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   Electronically Signed   By: Genevie Ann M.D.   On: 01/03/2015 15:23   Ct Chest W Contrast  01/03/2015   CLINICAL DATA:  Weakness, bilateral neck masses for 2-3 months. Patient had difficulty holding still for the  examination.  EXAM: CT CHEST, ABDOMEN, AND PELVIS WITH CONTRAST  TECHNIQUE: Multidetector CT imaging of the chest, abdomen and pelvis was performed following the standard protocol during bolus administration of intravenous contrast.  CONTRAST:  152mL OMNIPAQUE IOHEXOL 300 MG/ML  SOLN  COMPARISON:  Neck CT dictated same date, separate report. No prior CT chest abdomen, or pelvis for comparison.  FINDINGS: CT CHEST FINDINGS  Mediastinum/Nodes: Mild cardiac motion artifact. Heart size is normal. Trace pericardial fluid.  Bulky bilateral axillary lymphadenopathy, representative right axillary lymph node measuring 3.5 cm short axis diameter image 19 and representative left axillary lymph node short axis measuring 4.9 cm  image 11.  Small AP window lymph nodes measure 4 mm and smaller. No hilar, pretracheal, or subcarinal lymphadenopathy.  Great vessels are normal in caliber.  Lungs/Pleura: 6 mm pulmonary nodule in the right upper lobe image 20. No other measurable nodule, mass, or consolidation. Central airways are patent.  Upper abdomen: Please see detailed report below.  Musculoskeletal: No acute osseous abnormality.  CT ABDOMEN AND PELVIS FINDINGS  Lower chest:  Please see dedicated report above.  Hepatobiliary: Liver and gallbladder appear unremarkable.  Pancreas: Normal  Spleen: Mild splenomegaly is identified, 13.4 cm image 48, without focal abnormality.  Adrenals/Urinary Tract: Adrenal glands and kidneys are unremarkable. No hydroureteronephrosis. Pelvic phleboliths are noted but no radiopaque renal or ureteral calculus is identified. Bladder is normal.  Stomach/Bowel: Normal appendix. No bowel wall thickening or focal segmental dilatation is identified. A few colonic diverticuli are identified without evidence for diverticulitis.  Vascular/Lymphatic: Multi station lymphadenopathy is noted as follows:  Gastrohepatic node, 3.6 cm image 61.  Aortocaval node, 1.1 cm image 67.  Diffuse mesenteric lymphadenopathy, largest right lower quadrant 1.4 cm image 86.  Pelvic sidewall lymphadenopathy, largest 1.2 cm on the left image 113.  Inguinal lymphadenopathy, largest 2.1 cm on the left image 121.  No aortic aneurysm.  Other: Fat containing umbilical hernia.  No free air or fluid.  Evidence of hysterectomy.  Ovaries appear normal.  Musculoskeletal: No acute osseous abnormality or lytic or sclerotic osseous lesion. L5-S1 disc degenerative change noted.  IMPRESSION: Multi station axillary, gastrohepatic, retroperitoneal, mesenteric, pelvic, and inguinal lymphadenopathy as above. Findings are highly suspicious for lymphoproliferative disorder such as lymphoma.  6 mm right upper lobe pulmonary parenchymal nodule. If the patient is at high  risk for bronchogenic carcinoma, follow-up chest CT at 6-12 months is recommended. If the patient is at low risk for bronchogenic carcinoma, follow-up chest CT at 12 months is recommended. This recommendation follows the consensus statement: Guidelines for Management of Small Pulmonary Nodules Detected on CT Scans: A Statement from the [place] as published in Radiology 2005;237:395-400.   Electronically Signed   By: Conchita Paris M.D.   On: 01/03/2015 16:55   Ct Abdomen Pelvis W Contrast  01/03/2015   CLINICAL DATA:  Weakness, bilateral neck masses for 2-3 months. Patient had difficulty holding still for the examination.  EXAM: CT CHEST, ABDOMEN, AND PELVIS WITH CONTRAST  TECHNIQUE: Multidetector CT imaging of the chest, abdomen and pelvis was performed following the standard protocol during bolus administration of intravenous contrast.  CONTRAST:  169mL OMNIPAQUE IOHEXOL 300 MG/ML  SOLN  COMPARISON:  Neck CT dictated same date, separate report. No prior CT chest abdomen, or pelvis for comparison.  FINDINGS: CT CHEST FINDINGS  Mediastinum/Nodes: Mild cardiac motion artifact. Heart size is normal. Trace pericardial fluid.  Bulky bilateral axillary lymphadenopathy, representative right axillary lymph node measuring 3.5 cm short axis diameter  image 19 and representative left axillary lymph node short axis measuring 4.9 cm image 11.  Small AP window lymph nodes measure 4 mm and smaller. No hilar, pretracheal, or subcarinal lymphadenopathy.  Great vessels are normal in caliber.  Lungs/Pleura: 6 mm pulmonary nodule in the right upper lobe image 20. No other measurable nodule, mass, or consolidation. Central airways are patent.  Upper abdomen: Please see detailed report below.  Musculoskeletal: No acute osseous abnormality.  CT ABDOMEN AND PELVIS FINDINGS  Lower chest:  Please see dedicated report above.  Hepatobiliary: Liver and gallbladder appear unremarkable.  Pancreas: Normal  Spleen: Mild splenomegaly  is identified, 13.4 cm image 48, without focal abnormality.  Adrenals/Urinary Tract: Adrenal glands and kidneys are unremarkable. No hydroureteronephrosis. Pelvic phleboliths are noted but no radiopaque renal or ureteral calculus is identified. Bladder is normal.  Stomach/Bowel: Normal appendix. No bowel wall thickening or focal segmental dilatation is identified. A few colonic diverticuli are identified without evidence for diverticulitis.  Vascular/Lymphatic: Multi station lymphadenopathy is noted as follows:  Gastrohepatic node, 3.6 cm image 61.  Aortocaval node, 1.1 cm image 67.  Diffuse mesenteric lymphadenopathy, largest right lower quadrant 1.4 cm image 86.  Pelvic sidewall lymphadenopathy, largest 1.2 cm on the left image 113.  Inguinal lymphadenopathy, largest 2.1 cm on the left image 121.  No aortic aneurysm.  Other: Fat containing umbilical hernia.  No free air or fluid.  Evidence of hysterectomy.  Ovaries appear normal.  Musculoskeletal: No acute osseous abnormality or lytic or sclerotic osseous lesion. L5-S1 disc degenerative change noted.  IMPRESSION: Multi station axillary, gastrohepatic, retroperitoneal, mesenteric, pelvic, and inguinal lymphadenopathy as above. Findings are highly suspicious for lymphoproliferative disorder such as lymphoma.  6 mm right upper lobe pulmonary parenchymal nodule. If the patient is at high risk for bronchogenic carcinoma, follow-up chest CT at 6-12 months is recommended. If the patient is at low risk for bronchogenic carcinoma, follow-up chest CT at 12 months is recommended. This recommendation follows the consensus statement: Guidelines for Management of Small Pulmonary Nodules Detected on CT Scans: A Statement from the [place] as published in Radiology 2005;237:395-400.   Electronically Signed   By: Conchita Paris M.D.   On: 01/03/2015 16:55   Mm Digital Screening Bilateral  01/21/2015   CLINICAL DATA:  Screening. Patient with known CLL.  EXAM: DIGITAL  SCREENING BILATERAL MAMMOGRAM WITH CAD  COMPARISON:  No prior mammograms available for comparison. 01/03/2015 chest CT.  ACR Breast Density Category b: There are scattered areas of fibroglandular density.  FINDINGS: Bulky bilateral axillary adenopathy again identified compatible with this patient's known CLL.  There are no suspicious findings within either breast.  Images were processed with CAD.  IMPRESSION: No mammographic evidence of breast malignancy.  Bulky bilateral axillary lymphadenopathy again noted, compatible with patient's known CLL.  A result letter of this screening mammogram will be mailed directly to the patient.  RECOMMENDATION: Screening mammogram in one year. (Code:[id number]-B-01Y)  BI-RADS CATEGORY  6: Known malignancy.   Electronically Signed   By: Margarette Canada M.D.   On: 01/21/2015 14:42    ASSESSMENT: CLL  PLAN:    1. CLL: CT scan results reviewed independently and reported as above with widespread lymphadenopathy and mild splenomegaly. 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 Given her lymphadenopathy, patient will benefit from chemotherapy. Proceed with cycle 1 of 4 of Rituxan and Treanda today. Patient will receive Rituxan and Treanda on day 1 and then Treanda only on day 2. This will be a 28  day cycle. Return to clinic tomorrow for [place], in 1 week for laboratory work and further evaluation, and then in 4 weeks for consideration of cycle 2. Will reimage after 4 cycles.  2. Anxiety: Monitor, patient will require Ativan prior to her next CT scan. 3. Hyperglycemia: Continue current diabetic medications. Monitor closely with dexamethasone as premed.  Patient expressed understanding and was in agreement with this plan. She also understands that She can call clinic at any time with any questions, concerns, or complaints.    Lloyd Huger, MD   01/23/2015 1:21 PM

## 2015-01-23 NOTE — Progress Notes (Signed)
[place]  Telephone:(336) [phone] Fax:(336) [phone]  ID: Tonya Myers OB: 02/08/1966  MR#: [id number]  [id number]  Patient Care Team: Minette Headland, NP as PCP - General (Hematology and Oncology)  CHIEF COMPLAINT:  Chief Complaint  Patient presents with  . CLL    INTERVAL HISTORY: Patient returns to clinic today for further evaluation and to assess her toleration of cycle 1 of Rituxan plus Treanda. She had a reaction to Rituxan within the first 10-15 seconds of her infusion. Patient states she felt "great for approximately 2 days after her treatment but then felt very fatigued for days 3 through 5. She currently feels well and is back to her baseline. She has no neurologic complaints. She denies any fevers, night sweats, or weight loss. She has no chest pain or shortness of breath. She denies any nausea, vomiting, constipation, or diarrhea. She has no melanoma or hematochezia. She has no urinary complaints. Patient offers no further specific complaints todayy.  REVIEW OF SYSTEMS:   Review of Systems  Constitutional: Negative for fever, weight loss and malaise/fatigue.  Respiratory: Negative.   Cardiovascular: Negative.   Gastrointestinal: Negative.   Musculoskeletal: Negative.   Neurological: Negative for weakness.  Psychiatric/Behavioral: The patient is nervous/anxious.     As per HPI. Otherwise, a complete review of systems is negatve.  PAST MEDICAL HISTORY: Past Medical History  Diagnosis Date  . Asthma   . Vertigo   . Chronic back pain   . Hypertension   . Diabetes mellitus without complication     PAST SURGICAL HISTORY: Past Surgical History  Procedure Laterality Date  . Abdominal hysterectomy      FAMILY HISTORY Family History  Problem Relation Age of Onset  . Cancer Paternal Aunt   . Breast cancer Maternal Aunt 50       ADVANCED DIRECTIVES:    HEALTH MAINTENANCE: Social History  Substance Use Topics  . Smoking  status: Never Smoker   . Smokeless tobacco: Never Used  . Alcohol Use: 0.6 oz/week    1 Cans of beer per week     Colonoscopy:  PAP:  Bone density:  Lipid panel:  No Known Allergies  Current Outpatient Prescriptions  Medication Sig Dispense Refill  . cyclobenzaprine (FLEXERIL) 5 MG tablet Take by mouth.    . dapagliflozin propanediol (FARXIGA) 5 MG TABS tablet Take by mouth.    . Diclofenac Sodium CR 100 MG 24 hr tablet Take by mouth.    . diltiazem (TIAZAC) 180 MG 24 hr capsule Take 180 mg by mouth daily.    Marland Kitchen ezetimibe (ZETIA) 10 MG tablet Take by mouth.    . fluticasone (FLONASE) 50 MCG/ACT nasal spray Place into the nose.    . furosemide (LASIX) 20 MG tablet Take by mouth.    . gabapentin (NEURONTIN) 600 MG tablet Take 600 mg by mouth at bedtime.    . irbesartan (AVAPRO) 300 MG tablet Take by mouth.    . levothyroxine (SYNTHROID, LEVOTHROID) 150 MCG tablet Take by mouth.    . lidocaine (LIDODERM) 5 % Place onto the skin.    Marland Kitchen LORazepam (ATIVAN) 1 MG tablet Take 1 tablet (1 mg total) by mouth once. Repeat x1 if necessary 30-60 mins prior to CT scan 2 tablet 0  . metFORMIN (GLUCOPHAGE) 1000 MG tablet Take by mouth.    . rosuvastatin (CRESTOR) 10 MG tablet Take by mouth.    . sitaGLIPtin (JANUVIA) 100 MG tablet Take by mouth.     No current  facility-administered medications for this visit.    OBJECTIVE: There were no vitals filed for this visit.   There is no weight on file to calculate BMI.    ECOG FS:0 - Asymptomatic  General: Well-developed, well-nourished, no acute distress. Eyes: Pink conjunctiva, anicteric sclera. Lungs: Clear to auscultation bilaterally. Heart: Regular rate and rhythm. No rubs, murmurs, or gallops. Abdomen: Soft, nontender, nondistended. No organomegaly noted, normoactive bowel sounds. Musculoskeletal: No edema, cyanosis, or clubbing. Neuro: Alert, answering all questions appropriately. Cranial nerves grossly intact. Skin: No rashes or petechiae  noted. Psych: Normal affect. Lymphatics: No palpable cervical, calvicular, and axillary LAD.   LAB RESULTS:  Lab Results  Component Value Date   NA 133* 01/22/2015   K 4.0 01/22/2015   CL 102 01/22/2015   CO2 23 01/22/2015   GLUCOSE 296* 01/22/2015   BUN 12 01/22/2015   CREATININE 0.86 01/22/2015   CALCIUM 8.5* 01/22/2015   PROT 7.0 01/22/2015   ALBUMIN 3.9 01/22/2015   AST 26 01/22/2015   ALT 32 01/22/2015   ALKPHOS 77 01/22/2015   BILITOT 0.8 01/22/2015   GFRNONAA >60 01/22/2015   GFRAA >60 01/22/2015    Lab Results  Component Value Date   WBC 8.0 01/22/2015   NEUTROABS 2.7 01/22/2015   HGB 12.1 01/22/2015   HCT 36.4 01/22/2015   MCV 81.3 01/22/2015   PLT 286 01/22/2015     STUDIES: Ct Soft Tissue Neck W Contrast  01/03/2015   CLINICAL DATA:  49 year old female with suspected leukemia. Bilateral neck masses for 3 months. Initial encounter.  EXAM: CT NECK WITH CONTRAST  TECHNIQUE: Multidetector CT imaging of the neck was performed using the standard protocol following the bolus administration of intravenous contrast.  CONTRAST:  12mL OMNIPAQUE IOHEXOL 300 MG/ML SOLN in conjunction with contrast enhanced imaging of the chest, abdomen, and pelvis reported separately.  COMPARISON:  No prior  FINDINGS: Pharynx and larynx: Negative larynx. Pharyngeal contours appear symmetric. There is adenoid hypertrophy which seems advanced for age. Palatine and lingual tonsils have a more normal appearance. Negative parapharyngeal spaces. Negative retropharyngeal space.  Salivary glands: Submandibular glands, sublingual space, and parotid glands are within normal limits. There are increased bilateral parotid space lymph nodes, see details below.  Thyroid: Negative.  Lymph nodes: Diffuse bilateral lymphadenopathy. All nodal levels are affected. Enlarged nodes in the right neck individually measure up to 15 x 24 by 40 mm (AP by transverse by CC), while enlarged nodes on the left individually  measure up to 28 x 25 x 36 mm. Extensive bilateral axillary and left greater than right thoracic inlet lymphadenopathy also noted.  Vascular: Major vascular structures in the neck and at the skullbase are patent.  Limited intracranial: Negative.  Visualized orbits: Negative.  Mastoids and visualized paranasal sinuses: Trace right maxillary sinus mucosal thickening. Other paranasal sinuses are clear.  Skeleton: Degenerative changes in the cervical spine with bulky disc osteophyte complex versus posterior longitudinal ligament ossification (e.g. C3-C4 series 9, image 76). Subsequent multilevel cervical spinal stenosis. No acute or suspicious osseous lesion identified. Incidental torus palatini S.  Upper chest: Reported separately.  IMPRESSION: 1. Diffuse bilateral cervical lymphadenopathy in keeping with lymphoma/leukemia. Adenoid hypertrophy greater than expected for age might also reflect involvement by lymphoproliferative disease. 2. CT Chest, Abdomen and Pelvis from today reported separately. 3. 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   Electronically Signed   By: Herminio Heads.D.  On: 01/03/2015 15:23   Ct Chest W Contrast  01/03/2015   CLINICAL DATA:  Weakness, bilateral neck masses for 2-3 months. Patient had difficulty holding still for the examination.  EXAM: CT CHEST, ABDOMEN, AND PELVIS WITH CONTRAST  TECHNIQUE: Multidetector CT imaging of the chest, abdomen and pelvis was performed following the standard protocol during bolus administration of intravenous contrast.  CONTRAST:  175mL OMNIPAQUE IOHEXOL 300 MG/ML  SOLN  COMPARISON:  Neck CT dictated same date, separate report. No prior CT chest abdomen, or pelvis for comparison.  FINDINGS: CT CHEST FINDINGS  Mediastinum/Nodes: Mild cardiac motion artifact. Heart size is normal. Trace pericardial fluid.  Bulky bilateral  axillary lymphadenopathy, representative right axillary lymph node measuring 3.5 cm short axis diameter image 19 and representative left axillary lymph node short axis measuring 4.9 cm image 11.  Small AP window lymph nodes measure 4 mm and smaller. No hilar, pretracheal, or subcarinal lymphadenopathy.  Great vessels are normal in caliber.  Lungs/Pleura: 6 mm pulmonary nodule in the right upper lobe image 20. No other measurable nodule, mass, or consolidation. Central airways are patent.  Upper abdomen: Please see detailed report below.  Musculoskeletal: No acute osseous abnormality.  CT ABDOMEN AND PELVIS FINDINGS  Lower chest:  Please see dedicated report above.  Hepatobiliary: Liver and gallbladder appear unremarkable.  Pancreas: Normal  Spleen: Mild splenomegaly is identified, 13.4 cm image 48, without focal abnormality.  Adrenals/Urinary Tract: Adrenal glands and kidneys are unremarkable. No hydroureteronephrosis. Pelvic phleboliths are noted but no radiopaque renal or ureteral calculus is identified. Bladder is normal.  Stomach/Bowel: Normal appendix. No bowel wall thickening or focal segmental dilatation is identified. A few colonic diverticuli are identified without evidence for diverticulitis.  Vascular/Lymphatic: Multi station lymphadenopathy is noted as follows:  Gastrohepatic node, 3.6 cm image 61.  Aortocaval node, 1.1 cm image 67.  Diffuse mesenteric lymphadenopathy, largest right lower quadrant 1.4 cm image 86.  Pelvic sidewall lymphadenopathy, largest 1.2 cm on the left image 113.  Inguinal lymphadenopathy, largest 2.1 cm on the left image 121.  No aortic aneurysm.  Other: Fat containing umbilical hernia.  No free air or fluid.  Evidence of hysterectomy.  Ovaries appear normal.  Musculoskeletal: No acute osseous abnormality or lytic or sclerotic osseous lesion. L5-S1 disc degenerative change noted.  IMPRESSION: Multi station axillary, gastrohepatic, retroperitoneal, mesenteric, pelvic, and inguinal  lymphadenopathy as above. Findings are highly suspicious for lymphoproliferative disorder such as lymphoma.  6 mm right upper lobe pulmonary parenchymal nodule. If the patient is at high risk for bronchogenic carcinoma, follow-up chest CT at 6-12 months is recommended. If the patient is at low risk for bronchogenic carcinoma, follow-up chest CT at 12 months is recommended. This recommendation follows the consensus statement: Guidelines for Management of Small Pulmonary Nodules Detected on CT Scans: A Statement from the [place] as published in Radiology 2005;237:395-400.   Electronically Signed   By: Conchita Paris M.D.   On: 01/03/2015 16:55   Ct Abdomen Pelvis W Contrast  01/03/2015   CLINICAL DATA:  Weakness, bilateral neck masses for 2-3 months. Patient had difficulty holding still for the examination.  EXAM: CT CHEST, ABDOMEN, AND PELVIS WITH CONTRAST  TECHNIQUE: Multidetector CT imaging of the chest, abdomen and pelvis was performed following the standard protocol during bolus administration of intravenous contrast.  CONTRAST:  112mL OMNIPAQUE IOHEXOL 300 MG/ML  SOLN  COMPARISON:  Neck CT dictated same date, separate report. No prior CT chest abdomen, or pelvis for comparison.  FINDINGS: CT CHEST  FINDINGS  Mediastinum/Nodes: Mild cardiac motion artifact. Heart size is normal. Trace pericardial fluid.  Bulky bilateral axillary lymphadenopathy, representative right axillary lymph node measuring 3.5 cm short axis diameter image 19 and representative left axillary lymph node short axis measuring 4.9 cm image 11.  Small AP window lymph nodes measure 4 mm and smaller. No hilar, pretracheal, or subcarinal lymphadenopathy.  Great vessels are normal in caliber.  Lungs/Pleura: 6 mm pulmonary nodule in the right upper lobe image 20. No other measurable nodule, mass, or consolidation. Central airways are patent.  Upper abdomen: Please see detailed report below.  Musculoskeletal: No acute osseous abnormality.   CT ABDOMEN AND PELVIS FINDINGS  Lower chest:  Please see dedicated report above.  Hepatobiliary: Liver and gallbladder appear unremarkable.  Pancreas: Normal  Spleen: Mild splenomegaly is identified, 13.4 cm image 48, without focal abnormality.  Adrenals/Urinary Tract: Adrenal glands and kidneys are unremarkable. No hydroureteronephrosis. Pelvic phleboliths are noted but no radiopaque renal or ureteral calculus is identified. Bladder is normal.  Stomach/Bowel: Normal appendix. No bowel wall thickening or focal segmental dilatation is identified. A few colonic diverticuli are identified without evidence for diverticulitis.  Vascular/Lymphatic: Multi station lymphadenopathy is noted as follows:  Gastrohepatic node, 3.6 cm image 61.  Aortocaval node, 1.1 cm image 67.  Diffuse mesenteric lymphadenopathy, largest right lower quadrant 1.4 cm image 86.  Pelvic sidewall lymphadenopathy, largest 1.2 cm on the left image 113.  Inguinal lymphadenopathy, largest 2.1 cm on the left image 121.  No aortic aneurysm.  Other: Fat containing umbilical hernia.  No free air or fluid.  Evidence of hysterectomy.  Ovaries appear normal.  Musculoskeletal: No acute osseous abnormality or lytic or sclerotic osseous lesion. L5-S1 disc degenerative change noted.  IMPRESSION: Multi station axillary, gastrohepatic, retroperitoneal, mesenteric, pelvic, and inguinal lymphadenopathy as above. Findings are highly suspicious for lymphoproliferative disorder such as lymphoma.  6 mm right upper lobe pulmonary parenchymal nodule. If the patient is at high risk for bronchogenic carcinoma, follow-up chest CT at 6-12 months is recommended. If the patient is at low risk for bronchogenic carcinoma, follow-up chest CT at 12 months is recommended. This recommendation follows the consensus statement: Guidelines for Management of Small Pulmonary Nodules Detected on CT Scans: A Statement from the [place] as published in Radiology 2005;237:395-400.    Electronically Signed   By: Conchita Paris M.D.   On: 01/03/2015 16:55   Mm Digital Screening Bilateral  01/21/2015   CLINICAL DATA:  Screening. Patient with known CLL.  EXAM: DIGITAL SCREENING BILATERAL MAMMOGRAM WITH CAD  COMPARISON:  No prior mammograms available for comparison. 01/03/2015 chest CT.  ACR Breast Density Category b: There are scattered areas of fibroglandular density.  FINDINGS: Bulky bilateral axillary adenopathy again identified compatible with this patient's known CLL.  There are no suspicious findings within either breast.  Images were processed with CAD.  IMPRESSION: No mammographic evidence of breast malignancy.  Bulky bilateral axillary lymphadenopathy again noted, compatible with patient's known CLL.  A result letter of this screening mammogram will be mailed directly to the patient.  RECOMMENDATION: Screening mammogram in one year. (Code:[id number]-B-01Y)  BI-RADS CATEGORY  6: Known malignancy.   Electronically Signed   By: Margarette Canada M.D.   On: 01/21/2015 14:42    ASSESSMENT: CLL  PLAN:    1. CLL: CT scan results reviewed independently and reported as above with widespread lymphadenopathy and mild splenomegaly. 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 Given her lymphadenopathy, patient will benefit from chemotherapy. Patient tolerated  cycle 1 of 4 of Rituxan and Treanda relatively well last week with only a reaction to Rituxan. Therefore she only received a day 1 and 2 of Treanda cycle 1. We will attempt to give Rituxan again for cycle 2, but plan on giving patient increased premedications in the hopes of avoiding a reaction. Will likely give a test dose prior to giving a full treatment. Patient will receive Rituxan and Treanda on day 1 and then Treanda only on day 2. This will be a 28 day cycle. Return to clinic tomorrow in 3 weeks for consideration of cycle 2. Will reimage after 4 cycles.  2. Anxiety: Monitor, patient will require Ativan prior to her next CT scan. 3.  Hyperglycemia: Continue current diabetic medications. Monitor closely with dexamethasone as premed.  Approximately 30 minutes was spent in discussion and consultation.  Patient expressed understanding and was in agreement with this plan. She also understands that She can call clinic at any time with any questions, concerns, or complaints.    Lloyd Huger, MD   01/23/2015 1:28 PM

## 2015-01-28 LAB — MISC LABCORP TEST (SEND OUT)
LABCORP TEST NAME: 252489
Labcorp test code: 252489

## 2015-02-10 ENCOUNTER — Encounter (INDEPENDENT_AMBULATORY_CARE_PROVIDER_SITE_OTHER): Payer: Self-pay

## 2015-02-10 ENCOUNTER — Inpatient Hospital Stay: Payer: Medicare Other

## 2015-02-10 ENCOUNTER — Inpatient Hospital Stay: Payer: Medicare Other | Attending: Oncology

## 2015-02-10 ENCOUNTER — Inpatient Hospital Stay (HOSPITAL_BASED_OUTPATIENT_CLINIC_OR_DEPARTMENT_OTHER): Payer: Medicare Other | Admitting: Oncology

## 2015-02-10 VITALS — BP 118/76 | HR 82 | Temp 97.2°F | Resp 18 | Wt 252.9 lb

## 2015-02-10 VITALS — BP 125/75 | HR 79 | Resp 18

## 2015-02-10 DIAGNOSIS — R42 Dizziness and giddiness: Secondary | ICD-10-CM | POA: Insufficient documentation

## 2015-02-10 DIAGNOSIS — C9110 Chronic lymphocytic leukemia of B-cell type not having achieved remission: Secondary | ICD-10-CM

## 2015-02-10 DIAGNOSIS — E1165 Type 2 diabetes mellitus with hyperglycemia: Secondary | ICD-10-CM

## 2015-02-10 DIAGNOSIS — M549 Dorsalgia, unspecified: Secondary | ICD-10-CM | POA: Insufficient documentation

## 2015-02-10 DIAGNOSIS — D649 Anemia, unspecified: Secondary | ICD-10-CM | POA: Insufficient documentation

## 2015-02-10 DIAGNOSIS — G8929 Other chronic pain: Secondary | ICD-10-CM | POA: Insufficient documentation

## 2015-02-10 DIAGNOSIS — Z5111 Encounter for antineoplastic chemotherapy: Secondary | ICD-10-CM | POA: Diagnosis not present

## 2015-02-10 DIAGNOSIS — F419 Anxiety disorder, unspecified: Secondary | ICD-10-CM

## 2015-02-10 DIAGNOSIS — Z5112 Encounter for antineoplastic immunotherapy: Secondary | ICD-10-CM | POA: Diagnosis not present

## 2015-02-10 DIAGNOSIS — J45909 Unspecified asthma, uncomplicated: Secondary | ICD-10-CM | POA: Diagnosis not present

## 2015-02-10 DIAGNOSIS — Z803 Family history of malignant neoplasm of breast: Secondary | ICD-10-CM | POA: Diagnosis not present

## 2015-02-10 DIAGNOSIS — Z79899 Other long term (current) drug therapy: Secondary | ICD-10-CM | POA: Diagnosis not present

## 2015-02-10 LAB — CBC WITH DIFFERENTIAL/PLATELET
BASOS ABS: 0 10*3/uL (ref 0–0.1)
BASOS PCT: 1 %
EOS PCT: 3 %
Eosinophils Absolute: 0.1 10*3/uL (ref 0–0.7)
HCT: 33.1 % — ABNORMAL LOW (ref 35.0–47.0)
Hemoglobin: 11 g/dL — ABNORMAL LOW (ref 12.0–16.0)
LYMPHS PCT: 24 %
Lymphs Abs: 1 10*3/uL (ref 1.0–3.6)
MCH: 27.3 pg (ref 26.0–34.0)
MCHC: 33.3 g/dL (ref 32.0–36.0)
MCV: 82 fL (ref 80.0–100.0)
MONO ABS: 0.5 10*3/uL (ref 0.2–0.9)
Monocytes Relative: 12 %
Neutro Abs: 2.4 10*3/uL (ref 1.4–6.5)
Neutrophils Relative %: 60 %
PLATELETS: 208 10*3/uL (ref 150–440)
RBC: 4.04 MIL/uL (ref 3.80–5.20)
RDW: 15 % — AB (ref 11.5–14.5)
WBC: 4 10*3/uL (ref 3.6–11.0)

## 2015-02-10 LAB — COMPREHENSIVE METABOLIC PANEL
ALBUMIN: 4.1 g/dL (ref 3.5–5.0)
ALT: 33 U/L (ref 14–54)
AST: 34 U/L (ref 15–41)
Alkaline Phosphatase: 73 U/L (ref 38–126)
Anion gap: 6 (ref 5–15)
BUN: 11 mg/dL (ref 6–20)
CHLORIDE: 105 mmol/L (ref 101–111)
CO2: 25 mmol/L (ref 22–32)
CREATININE: 0.75 mg/dL (ref 0.44–1.00)
Calcium: 8.2 mg/dL — ABNORMAL LOW (ref 8.9–10.3)
GFR calc Af Amer: >60 mL/min (ref >60)
GFR calc non Af Amer: >60 mL/min (ref >60)
GLUCOSE: 233 mg/dL — AB (ref 65–99)
POTASSIUM: 3.7 mmol/L (ref 3.5–5.1)
SODIUM: 136 mmol/L (ref 135–145)
Total Bilirubin: 0.8 mg/dL (ref 0.3–1.2)
Total Protein: 6.6 g/dL (ref 6.5–8.1)

## 2015-02-10 MED ORDER — DIPHENHYDRAMINE HCL 50 MG/ML IJ SOLN
50.0000 mg | Freq: Once | INTRAMUSCULAR | Status: AC
  Administered 2015-02-10: 50 mg via INTRAVENOUS
  Filled 2015-02-10: qty 1

## 2015-02-10 MED ORDER — METHYLPREDNISOLONE SODIUM SUCC 125 MG IJ SOLR
125.0000 mg | Freq: Once | INTRAMUSCULAR | Status: AC
  Administered 2015-02-10: 125 mg via INTRAVENOUS
  Filled 2015-02-10: qty 2

## 2015-02-10 MED ORDER — SODIUM CHLORIDE 0.9 % IV SOLN
Freq: Once | INTRAVENOUS | Status: AC
  Administered 2015-02-10: 12:00:00 via INTRAVENOUS
  Filled 2015-02-10: qty 4

## 2015-02-10 MED ORDER — FAMOTIDINE IN NACL 20-0.9 MG/50ML-% IV SOLN
20.0000 mg | Freq: Two times a day (BID) | INTRAVENOUS | Status: DC
Start: 2015-02-10 — End: 2015-02-10
  Administered 2015-02-10: 20 mg via INTRAVENOUS
  Filled 2015-02-10: qty 50

## 2015-02-10 MED ORDER — ACETAMINOPHEN 325 MG PO TABS
650.0000 mg | ORAL_TABLET | Freq: Once | ORAL | Status: AC
  Administered 2015-02-10: 650 mg via ORAL
  Filled 2015-02-10: qty 2

## 2015-02-10 MED ORDER — SODIUM CHLORIDE 0.9 % IV SOLN
375.0000 mg/m2 | Freq: Once | INTRAVENOUS | Status: AC
  Administered 2015-02-10: 900 mg via INTRAVENOUS
  Filled 2015-02-10: qty 80

## 2015-02-10 MED ORDER — SODIUM CHLORIDE 0.9 % IV SOLN
Freq: Once | INTRAVENOUS | Status: AC
  Administered 2015-02-10: 11:00:00 via INTRAVENOUS
  Filled 2015-02-10: qty 1000

## 2015-02-10 MED ORDER — SODIUM CHLORIDE 0.9 % IV SOLN
100.0000 mg/m2 | Freq: Once | INTRAVENOUS | Status: AC
  Administered 2015-02-10: 250 mg via INTRAVENOUS
  Filled 2015-02-10: qty 10

## 2015-02-10 NOTE — Progress Notes (Signed)
About 1 week after last treatment patient started feeling an increase in fatigue with dizziness that lasted 2 weeks.  She did increase her water intake which increased her urine output and she believes the frequent urination has caused vaginal irritation.  The diarrhea is not new but has increased with treatments and is taking Immodium.

## 2015-02-11 ENCOUNTER — Inpatient Hospital Stay: Payer: Medicare Other

## 2015-02-11 VITALS — BP 116/77 | HR 76 | Temp 97.0°F | Resp 18

## 2015-02-11 DIAGNOSIS — C9110 Chronic lymphocytic leukemia of B-cell type not having achieved remission: Secondary | ICD-10-CM

## 2015-02-11 DIAGNOSIS — Z5112 Encounter for antineoplastic immunotherapy: Secondary | ICD-10-CM | POA: Diagnosis not present

## 2015-02-11 MED ORDER — SODIUM CHLORIDE 0.9 % IV SOLN
Freq: Once | INTRAVENOUS | Status: AC
  Administered 2015-02-11: 09:00:00 via INTRAVENOUS
  Filled 2015-02-11: qty 1000

## 2015-02-11 MED ORDER — SODIUM CHLORIDE 0.9 % IV SOLN
Freq: Once | INTRAVENOUS | Status: AC
  Administered 2015-02-11: 09:00:00 via INTRAVENOUS
  Filled 2015-02-11: qty 4

## 2015-02-11 MED ORDER — SODIUM CHLORIDE 0.9 % IV SOLN
100.0000 mg/m2 | Freq: Once | INTRAVENOUS | Status: AC
  Administered 2015-02-11: 250 mg via INTRAVENOUS
  Filled 2015-02-11: qty 10

## 2015-02-12 ENCOUNTER — Ambulatory Visit: Payer: Self-pay

## 2015-02-12 ENCOUNTER — Other Ambulatory Visit: Payer: Self-pay

## 2015-02-12 ENCOUNTER — Ambulatory Visit: Payer: Self-pay | Admitting: Oncology

## 2015-02-13 ENCOUNTER — Ambulatory Visit: Payer: Self-pay

## 2015-02-20 ENCOUNTER — Encounter: Payer: Self-pay | Admitting: Oncology

## 2015-02-22 NOTE — Progress Notes (Signed)
[place]  Telephone:(336) [phone] Fax:(336) [phone]  ID: Tonya Myers OB: [birth year]  MR#: [id number]  [id number]  Patient Care Team: Minette Headland, NP as PCP - General (Hematology and Oncology)  CHIEF COMPLAINT:  Chief Complaint  Patient presents with  . CLL    INTERVAL HISTORY: Patient returns to clinic today for further evaluation and consideration of cycle 2 of Rituxan plus Treanda. She had a reaction to Rituxan within the first 10-15 seconds of her infusion. She currently feels well and is asymptomatic. She has no neurologic complaints. She denies any fevers, night sweats, or weight loss. She has no chest pain or shortness of breath. She denies any nausea, vomiting, constipation, or diarrhea. She has no melena or hematochezia. She has no urinary complaints. Patient offers no specific complaints todayy.  REVIEW OF SYSTEMS:   Review of Systems  Constitutional: Negative for fever, weight loss and malaise/fatigue.  Respiratory: Negative.   Cardiovascular: Negative.   Gastrointestinal: Negative.   Musculoskeletal: Negative.   Neurological: Negative for weakness.  Psychiatric/Behavioral: The patient is nervous/anxious.     As per HPI. Otherwise, a complete review of systems is negatve.  PAST MEDICAL HISTORY: Past Medical History  Diagnosis Date  . Asthma   . Vertigo   . Chronic back pain   . Hypertension   . Diabetes mellitus without complication     PAST SURGICAL HISTORY: Past Surgical History  Procedure Laterality Date  . Abdominal hysterectomy      FAMILY HISTORY Family History  Problem Relation Age of Onset  . Cancer Paternal Aunt   . Breast cancer Maternal Aunt 40       ADVANCED DIRECTIVES:    HEALTH MAINTENANCE: Social History  Substance Use Topics  . Smoking status: Never Smoker   . Smokeless tobacco: Never Used  . Alcohol Use: 0.6 oz/week    1 Cans of beer per week     Colonoscopy:  PAP:  Bone  density:  Lipid panel:  No Known Allergies  Current Outpatient Prescriptions  Medication Sig Dispense Refill  . cyclobenzaprine (FLEXERIL) 5 MG tablet Take by mouth.    . dapagliflozin propanediol (FARXIGA) 5 MG TABS tablet Take by mouth.    . Diclofenac Sodium CR 100 MG 24 hr tablet Take by mouth.    . diltiazem (TIAZAC) 180 MG 24 hr capsule Take 180 mg by mouth daily.    Marland Kitchen ezetimibe (ZETIA) 10 MG tablet Take by mouth.    . fluticasone (FLONASE) 50 MCG/ACT nasal spray Place into the nose.    . furosemide (LASIX) 20 MG tablet Take by mouth.    . gabapentin (NEURONTIN) 600 MG tablet Take 600 mg by mouth at bedtime.    . irbesartan (AVAPRO) 300 MG tablet Take by mouth.    . levothyroxine (SYNTHROID, LEVOTHROID) 150 MCG tablet Take by mouth.    . lidocaine (LIDODERM) 5 % Place onto the skin.    Marland Kitchen LORazepam (ATIVAN) 1 MG tablet Take 1 tablet (1 mg total) by mouth once. Repeat x1 if necessary 30-60 mins prior to CT scan 2 tablet 0  . metFORMIN (GLUCOPHAGE) 1000 MG tablet Take by mouth.    . rosuvastatin (CRESTOR) 10 MG tablet Take by mouth.    . sitaGLIPtin (JANUVIA) 100 MG tablet Take by mouth.     No current facility-administered medications for this visit.    OBJECTIVE: Filed Vitals:   02/10/15 1248  BP: 118/76  Pulse: 82  Temp: 97.2 F (36.2 C)  Resp:  18     Body mass index is 34.29 kg/(m^2).    ECOG FS:0 - Asymptomatic  General: Well-developed, well-nourished, no acute distress. Eyes: Pink conjunctiva, anicteric sclera. Lungs: Clear to auscultation bilaterally. Heart: Regular rate and rhythm. No rubs, murmurs, or gallops. Abdomen: Soft, nontender, nondistended. No organomegaly noted, normoactive bowel sounds. Musculoskeletal: No edema, cyanosis, or clubbing. Neuro: Alert, answering all questions appropriately. Cranial nerves grossly intact. Skin: No rashes or petechiae noted. Psych: Normal affect. Lymphatics: No palpable cervical, calvicular, and axillary LAD.   LAB  RESULTS:  Lab Results  Component Value Date   NA 136 02/10/2015   K 3.7 02/10/2015   CL 105 02/10/2015   CO2 25 02/10/2015   GLUCOSE 233* 02/10/2015   BUN 11 02/10/2015   CREATININE 0.75 02/10/2015   CALCIUM 8.2* 02/10/2015   PROT 6.6 02/10/2015   ALBUMIN 4.1 02/10/2015   AST 34 02/10/2015   ALT 33 02/10/2015   ALKPHOS 73 02/10/2015   BILITOT 0.8 02/10/2015   GFRNONAA >60 02/10/2015   GFRAA >60 02/10/2015    Lab Results  Component Value Date   WBC 4.0 02/10/2015   NEUTROABS 2.4 02/10/2015   HGB 11.0* 02/10/2015   HCT 33.1* 02/10/2015   MCV 82.0 02/10/2015   PLT 208 02/10/2015     STUDIES: No results found.  ASSESSMENT: CLL  PLAN:    1. CLL: CT scan results reviewed independently with widespread lymphadenopathy and mild splenomegaly. 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 Proceed with cycle 2 of 4 of Rituxan and Treanda today. Because of patient's reaction to Rituxan during her first cycle, will premedicate her heavily prior to her infusion. Return to clinic tomorrow for [place] only and then in 4 weeks for consideration of cycle 3. Will reimage after 4 cycles.  2. Anxiety: Monitor, patient will require Ativan prior to her next CT scan. 3. Hyperglycemia: Continue current diabetic medications. Monitor closely with dexamethasone as premed. 4. Anemia: Mild, monitor.  Patient expressed understanding and was in agreement with this plan. She also understands that She can call clinic at any time with any questions, concerns, or complaints.    Lloyd Huger, MD   02/22/2015 7:51 PM

## 2015-03-10 ENCOUNTER — Ambulatory Visit: Payer: Self-pay | Admitting: Oncology

## 2015-03-10 ENCOUNTER — Ambulatory Visit: Payer: Self-pay

## 2015-03-10 ENCOUNTER — Other Ambulatory Visit: Payer: Self-pay

## 2015-03-11 ENCOUNTER — Inpatient Hospital Stay: Payer: Medicare Other

## 2015-03-11 ENCOUNTER — Encounter: Payer: Self-pay | Admitting: Family Medicine

## 2015-03-11 ENCOUNTER — Inpatient Hospital Stay: Payer: Medicare Other | Attending: Oncology | Admitting: Family Medicine

## 2015-03-11 ENCOUNTER — Ambulatory Visit: Payer: Self-pay

## 2015-03-11 VITALS — BP 120/76 | HR 68 | Resp 20

## 2015-03-11 VITALS — BP 115/75 | HR 80 | Temp 97.0°F | Resp 18 | Ht 72.0 in | Wt 247.4 lb

## 2015-03-11 DIAGNOSIS — C9110 Chronic lymphocytic leukemia of B-cell type not having achieved remission: Secondary | ICD-10-CM

## 2015-03-11 DIAGNOSIS — D649 Anemia, unspecified: Secondary | ICD-10-CM

## 2015-03-11 DIAGNOSIS — R630 Anorexia: Secondary | ICD-10-CM

## 2015-03-11 DIAGNOSIS — Z79899 Other long term (current) drug therapy: Secondary | ICD-10-CM

## 2015-03-11 DIAGNOSIS — F419 Anxiety disorder, unspecified: Secondary | ICD-10-CM | POA: Diagnosis not present

## 2015-03-11 DIAGNOSIS — I10 Essential (primary) hypertension: Secondary | ICD-10-CM | POA: Diagnosis not present

## 2015-03-11 DIAGNOSIS — E1165 Type 2 diabetes mellitus with hyperglycemia: Secondary | ICD-10-CM | POA: Insufficient documentation

## 2015-03-11 DIAGNOSIS — M549 Dorsalgia, unspecified: Secondary | ICD-10-CM | POA: Diagnosis not present

## 2015-03-11 DIAGNOSIS — K5900 Constipation, unspecified: Secondary | ICD-10-CM | POA: Insufficient documentation

## 2015-03-11 DIAGNOSIS — K649 Unspecified hemorrhoids: Secondary | ICD-10-CM | POA: Diagnosis not present

## 2015-03-11 DIAGNOSIS — J45909 Unspecified asthma, uncomplicated: Secondary | ICD-10-CM | POA: Diagnosis not present

## 2015-03-11 DIAGNOSIS — Z5111 Encounter for antineoplastic chemotherapy: Secondary | ICD-10-CM | POA: Insufficient documentation

## 2015-03-11 DIAGNOSIS — R42 Dizziness and giddiness: Secondary | ICD-10-CM | POA: Diagnosis not present

## 2015-03-11 HISTORY — DX: Unspecified hemorrhoids: K64.9

## 2015-03-11 LAB — COMPREHENSIVE METABOLIC PANEL
ALBUMIN: 3.8 g/dL (ref 3.5–5.0)
ALK PHOS: 86 U/L (ref 38–126)
ALT: 55 U/L — AB (ref 14–54)
ANION GAP: 8 (ref 5–15)
AST: 37 U/L (ref 15–41)
BUN: 7 mg/dL (ref 6–20)
CALCIUM: 9 mg/dL (ref 8.9–10.3)
CHLORIDE: 105 mmol/L (ref 101–111)
CO2: 23 mmol/L (ref 22–32)
Creatinine, Ser: 0.63 mg/dL (ref 0.44–1.00)
GFR calc non Af Amer: >60 mL/min (ref >60)
GLUCOSE: 228 mg/dL — AB (ref 65–99)
Potassium: 3.6 mmol/L (ref 3.5–5.1)
SODIUM: 136 mmol/L (ref 135–145)
Total Bilirubin: 0.8 mg/dL (ref 0.3–1.2)
Total Protein: 6.6 g/dL (ref 6.5–8.1)

## 2015-03-11 LAB — CBC WITH DIFFERENTIAL/PLATELET
BASOS PCT: 1 %
Basophils Absolute: 0 10*3/uL (ref 0–0.1)
EOS ABS: 0.4 10*3/uL (ref 0–0.7)
EOS PCT: 10 %
HCT: 32.2 % — ABNORMAL LOW (ref 35.0–47.0)
HEMOGLOBIN: 10.8 g/dL — AB (ref 12.0–16.0)
Lymphocytes Relative: 32 %
Lymphs Abs: 1.4 10*3/uL (ref 1.0–3.6)
MCH: 27 pg (ref 26.0–34.0)
MCHC: 33.4 g/dL (ref 32.0–36.0)
MCV: 80.8 fL (ref 80.0–100.0)
Monocytes Absolute: 0.4 10*3/uL (ref 0.2–0.9)
Monocytes Relative: 10 %
NEUTROS PCT: 47 %
Neutro Abs: 2.1 10*3/uL (ref 1.4–6.5)
PLATELETS: 310 10*3/uL (ref 150–440)
RBC: 3.99 MIL/uL (ref 3.80–5.20)
RDW: 16.3 % — ABNORMAL HIGH (ref 11.5–14.5)
WBC: 4.4 10*3/uL (ref 3.6–11.0)

## 2015-03-11 MED ORDER — DIPHENHYDRAMINE HCL 50 MG/ML IJ SOLN
50.0000 mg | Freq: Once | INTRAMUSCULAR | Status: AC
Start: 2015-03-11 — End: 2015-03-11
  Administered 2015-03-11: 50 mg via INTRAVENOUS
  Filled 2015-03-11: qty 1

## 2015-03-11 MED ORDER — FAMOTIDINE IN NACL 20-0.9 MG/50ML-% IV SOLN
20.0000 mg | Freq: Two times a day (BID) | INTRAVENOUS | Status: DC
  Administered 2015-03-11: 20 mg via INTRAVENOUS
  Filled 2015-03-11: qty 50

## 2015-03-11 MED ORDER — SODIUM CHLORIDE 0.9 % IV SOLN
Freq: Once | INTRAVENOUS | Status: AC
  Administered 2015-03-11: 11:00:00 via INTRAVENOUS
  Filled 2015-03-11: qty 4

## 2015-03-11 MED ORDER — SODIUM CHLORIDE 0.9 % IV SOLN
100.0000 mg/m2 | Freq: Once | INTRAVENOUS | Status: DC
  Filled 2015-03-11: qty 10

## 2015-03-11 MED ORDER — ACETAMINOPHEN 325 MG PO TABS
650.0000 mg | ORAL_TABLET | Freq: Once | ORAL | Status: AC
Start: 2015-03-11 — End: 2015-03-11
  Administered 2015-03-11: 650 mg via ORAL
  Filled 2015-03-11: qty 2

## 2015-03-11 MED ORDER — SODIUM CHLORIDE 0.9 % IV SOLN: 375.0000 mg/m2 | Freq: Once | INTRAVENOUS | Status: DC

## 2015-03-11 MED ORDER — SODIUM CHLORIDE 0.9 % IV SOLN
Freq: Once | INTRAVENOUS | Status: AC
  Administered 2015-03-11: 11:00:00 via INTRAVENOUS
  Filled 2015-03-11: qty 1000

## 2015-03-11 MED ORDER — HYDROCORTISONE ACETATE 25 MG RE SUPP: 25.0000 mg | Freq: Two times a day (BID) | RECTAL | Status: DC

## 2015-03-11 MED ORDER — METHYLPREDNISOLONE SODIUM SUCC 125 MG IJ SOLR
125.0000 mg | Freq: Once | INTRAMUSCULAR | Status: AC
Start: 2015-03-11 — End: 2015-03-11
  Administered 2015-03-11: 125 mg via INTRAVENOUS
  Filled 2015-03-11: qty 2

## 2015-03-11 MED ORDER — RITUXIMAB CHEMO INJECTION 500 MG/50ML
375.0000 mg/m2 | Freq: Once | INTRAVENOUS | Status: AC
  Administered 2015-03-11: 900 mg via INTRAVENOUS
  Filled 2015-03-11: qty 80

## 2015-03-11 NOTE — Progress Notes (Signed)
Patient is here for follow-up of CLL and rituxan, trenda treatment. She states that she has been having a lot of pain from hemorrhoids. She states that she has been dealing with them for about three weeks now. She rates her pain a 10+/10 from them. She has been trying to keep herself from having a bowel movement because it is so painful. Her appetite has been poor. She states that she takes a few bites of food and then she is done.

## 2015-03-11 NOTE — Progress Notes (Signed)
[place]  [phone]) [phone]  Fax:(336) [phone]     Tonya Myers DOB: 11/08/1965  MR#: [id number]  WLS#:[id number]  Patient Care Team: Minette Headland, NP as PCP - General (Hematology and Oncology)  CHIEF COMPLAINT:  Chief Complaint  Patient presents with  . CLL  . Chemotherapy    Rituxan, Gean Quint    INTERVAL HISTORY:  Patient returns to clinic today for further evaluation and consideration of cycle 3 of Rituxan plus Treanda. She had a reaction to Rituxan within the first 10-15 seconds of her cycle 1 infusion. She currently feels fairly well, some fatigue. She has no neurologic complaints. She reports having intermittent night sweats. She has significant complaint of severe hemorrhoids. She states pain is a 10+ especially with bowel movement. This has also effected her appetite as she is scared to eat. Constipation has began to be an issue. She has castor oil to use as needed. She denies any fevers or weight loss. She has no chest pain or shortness of breath. She denies any nausea, vomiting, or diarrhea. She has no urinary complaints.   REVIEW OF SYSTEMS:   Review of Systems  Constitutional: Positive for malaise/fatigue. Negative for fever, chills, weight loss and diaphoresis.       Night sweats  HENT: Negative for congestion, ear discharge, ear pain, hearing loss, nosebleeds, sore throat and tinnitus.   Eyes: Negative for blurred vision, double vision, photophobia, pain, discharge and redness.  Respiratory: Negative for cough, hemoptysis, sputum production, shortness of breath, wheezing and stridor.   Cardiovascular: Negative for chest pain, palpitations, orthopnea, claudication, leg swelling and PND.  Gastrointestinal: Positive for constipation and blood in stool. Negative for heartburn, nausea, vomiting, abdominal pain, diarrhea and melena.       Hemorrhoids  Genitourinary: Negative.   Musculoskeletal: Negative.   Skin: Negative.   Neurological:  Positive for weakness. Negative for dizziness, tingling, focal weakness, seizures and headaches.  Endo/Heme/Allergies: Does not bruise/bleed easily.  Psychiatric/Behavioral: Negative for depression. The patient is not nervous/anxious and does not have insomnia.     As per HPI. Otherwise, a complete review of systems is negatve.   PAST MEDICAL HISTORY: Past Medical History  Diagnosis Date  . Asthma   . Vertigo   . Chronic back pain   . Hypertension   . Diabetes mellitus without complication     PAST SURGICAL HISTORY: Past Surgical History  Procedure Laterality Date  . Abdominal hysterectomy      FAMILY HISTORY Family History  Problem Relation Age of Onset  . Cancer Paternal Aunt   . Breast cancer Maternal Aunt 36    GYNECOLOGIC HISTORY:  No LMP recorded. Patient has had a hysterectomy.     ADVANCED DIRECTIVES:    HEALTH MAINTENANCE: Social History  Substance Use Topics  . Smoking status: Never Smoker   . Smokeless tobacco: Never Used  . Alcohol Use: 0.6 oz/week    1 Cans of beer per week     Colonoscopy:  PAP:  Bone density:  Lipid panel:  No Known Allergies  Current Outpatient Prescriptions  Medication Sig Dispense Refill  . cyclobenzaprine (FLEXERIL) 5 MG tablet Take by mouth.    . dapagliflozin propanediol (FARXIGA) 5 MG TABS tablet Take by mouth.    . Diclofenac Sodium CR 100 MG 24 hr tablet Take by mouth.    . diltiazem (TIAZAC) 180 MG 24 hr capsule Take 180 mg by mouth daily.    Marland Kitchen ezetimibe (ZETIA) 10 MG tablet Take by mouth.    Marland Kitchen  fluticasone (FLONASE) 50 MCG/ACT nasal spray Place into the nose.    . furosemide (LASIX) 20 MG tablet Take by mouth.    . gabapentin (NEURONTIN) 600 MG tablet Take 600 mg by mouth at bedtime.    . irbesartan (AVAPRO) 300 MG tablet Take by mouth.    . levothyroxine (SYNTHROID, LEVOTHROID) 150 MCG tablet Take by mouth.    . lidocaine (LIDODERM) 5 % Place onto the skin.    Marland Kitchen LORazepam (ATIVAN) 1 MG tablet Take 1 tablet (1  mg total) by mouth once. Repeat x1 if necessary 30-60 mins prior to CT scan 2 tablet 0  . metFORMIN (GLUCOPHAGE) 1000 MG tablet Take by mouth.    . rosuvastatin (CRESTOR) 10 MG tablet Take by mouth.    . sitaGLIPtin (JANUVIA) 100 MG tablet Take by mouth.     No current facility-administered medications for this visit.    OBJECTIVE: BP 115/75 mmHg  Pulse 80  Temp(Src) 97 F (36.1 C) (Tympanic)  Resp 18  Ht 6' (1.829 m)  Wt 247 lb 5.7 oz (112.2 kg)  BMI 33.54 kg/m2   Body mass index is 33.54 kg/(m^2).    ECOG FS:1 - Symptomatic but completely ambulatory  General: Well-developed, well-nourished, no acute distress. Refused examination of hemorrhoids. Eyes: Pink conjunctiva, anicteric sclera. HEENT: Normocephalic, moist mucous membranes, clear oropharnyx. Lungs: Clear to auscultation bilaterally. Heart: Regular rate and rhythm. No rubs, murmurs, or gallops. Abdomen: Soft, nontender, nondistended. No organomegaly noted, normoactive bowel sounds. Musculoskeletal: No edema, cyanosis, or clubbing. Neuro: Alert, answering all questions appropriately. Cranial nerves grossly intact. Skin: No rashes or petechiae noted. Psych: Normal affect. Lymphatics: No cervical, clavicular, axillary LAD.   LAB RESULTS:  Appointment on 03/11/2015  Component Date Value Ref Range Status  . WBC 03/11/2015 4.4  3.6 - 11.0 K/uL Final  . RBC 03/11/2015 3.99  3.80 - 5.20 MIL/uL Final  . Hemoglobin 03/11/2015 10.8* 12.0 - 16.0 g/dL Final  . HCT 03/11/2015 32.2* 35.0 - 47.0 % Final  . MCV 03/11/2015 80.8  80.0 - 100.0 fL Final  . MCH 03/11/2015 27.0  26.0 - 34.0 pg Final  . MCHC 03/11/2015 33.4  32.0 - 36.0 g/dL Final  . RDW 03/11/2015 16.3* 11.5 - 14.5 % Final  . Platelets 03/11/2015 310  150 - 440 K/uL Final  . Neutrophils Relative % 03/11/2015 47   Final  . Neutro Abs 03/11/2015 2.1  1.4 - 6.5 K/uL Final  . Lymphocytes Relative 03/11/2015 32   Final  . Lymphs Abs 03/11/2015 1.4  1.0 - 3.6 K/uL Final  .  Monocytes Relative 03/11/2015 10   Final  . Monocytes Absolute 03/11/2015 0.4  0.2 - 0.9 K/uL Final  . Eosinophils Relative 03/11/2015 10   Final  . Eosinophils Absolute 03/11/2015 0.4  0 - 0.7 K/uL Final  . Basophils Relative 03/11/2015 1   Final  . Basophils Absolute 03/11/2015 0.0  0 - 0.1 K/uL Final  . Sodium 03/11/2015 136  135 - 145 mmol/L Final  . Potassium 03/11/2015 3.6  3.5 - 5.1 mmol/L Final  . Chloride 03/11/2015 105  101 - 111 mmol/L Final  . CO2 03/11/2015 23  22 - 32 mmol/L Final  . Glucose, Bld 03/11/2015 228* 65 - 99 mg/dL Final  . BUN 03/11/2015 7  6 - 20 mg/dL Final  . Creatinine, Ser 03/11/2015 0.63  0.44 - 1.00 mg/dL Final  . Calcium 03/11/2015 9.0  8.9 - 10.3 mg/dL Final  . Total Protein 03/11/2015 6.6  6.5 - 8.1 g/dL Final  . Albumin 03/11/2015 3.8  3.5 - 5.0 g/dL Final  . AST 03/11/2015 37  15 - 41 U/L Final  . ALT 03/11/2015 55* 14 - 54 U/L Final  . Alkaline Phosphatase 03/11/2015 86  38 - 126 U/L Final  . Total Bilirubin 03/11/2015 0.8  0.3 - 1.2 mg/dL Final  . GFR calc non Af Amer 03/11/2015 >60  >60 mL/min Final  . GFR calc Af Amer 03/11/2015 >60  >60 mL/min Final   Comment: (NOTE) The eGFR has been calculated using the CKD EPI equation. This calculation has not been validated in all clinical situations. eGFR's persistently <60 mL/min signify possible Chronic Kidney Disease.   . Anion gap 03/11/2015 8  5 - 15 Final    STUDIES: No results found.  ASSESSMENT:  CLL.  PLAN:  1. CLL: CT scan results with widespread lymphadenopathy and mild splenomegaly. 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 Proceed with cycle 3 of 4 of Rituxan and Treanda today. Because of patient's reaction to Rituxan during her first cycle, will premedicate her heavily prior to her infusion. Return to clinic tomorrow for [place] only and then in 4 weeks for consideration of cycle 4. Will reimage after 4 cycles.  2. Anxiety: Monitor, patient will require Ativan prior to her  next CT scan. 3. Hyperglycemia: Continue current diabetic medications. Monitor closely with dexamethasone as premed. 4. Anemia: Mild, monitor. 5. Hemorrhoids. Will send Anusol suppositories to pharmacy. Advised patient that if not improved after 1 week she will need referral to either surgeon or GI for further evaluation.  Patient expressed understanding and was in agreement with this plan. She also understands that She can call clinic at any time with any questions, concerns, or complaints.   Dr. Grayland Ormond was available for consultation and review of plan of care for this patient.  Tonya Kanner, NP   03/11/2015 10:12 AM

## 2015-03-12 ENCOUNTER — Inpatient Hospital Stay: Payer: Medicare Other

## 2015-03-12 ENCOUNTER — Telehealth: Payer: Self-pay

## 2015-03-12 ENCOUNTER — Other Ambulatory Visit: Payer: Self-pay | Admitting: Family Medicine

## 2015-03-12 ENCOUNTER — Ambulatory Visit: Payer: Self-pay

## 2015-03-12 DIAGNOSIS — C9110 Chronic lymphocytic leukemia of B-cell type not having achieved remission: Secondary | ICD-10-CM

## 2015-03-12 DIAGNOSIS — Z5111 Encounter for antineoplastic chemotherapy: Secondary | ICD-10-CM | POA: Diagnosis not present

## 2015-03-12 MED ORDER — SODIUM CHLORIDE 0.9 % IV SOLN
Freq: Once | INTRAVENOUS | Status: AC
  Administered 2015-03-12: 09:00:00 via INTRAVENOUS
  Filled 2015-03-12: qty 1000

## 2015-03-12 MED ORDER — DEXAMETHASONE SODIUM PHOSPHATE 100 MG/10ML IJ SOLN
Freq: Once | INTRAMUSCULAR | Status: AC
  Administered 2015-03-12: 09:00:00 via INTRAVENOUS
  Filled 2015-03-12: qty 4

## 2015-03-12 MED ORDER — SODIUM CHLORIDE 0.9 % IV SOLN
100.0000 mg/m2 | Freq: Once | INTRAVENOUS | Status: AC
  Administered 2015-03-12: 250 mg via INTRAVENOUS
  Filled 2015-03-12: qty 10

## 2015-04-02 NOTE — Telephone Encounter (Signed)
Erroneous encounter

## 2015-04-08 ENCOUNTER — Inpatient Hospital Stay: Payer: Medicare Other | Attending: Oncology | Admitting: Oncology

## 2015-04-08 ENCOUNTER — Inpatient Hospital Stay: Payer: Medicare Other

## 2015-04-08 ENCOUNTER — Encounter: Payer: Self-pay | Admitting: *Deleted

## 2015-04-08 VITALS — BP 123/85 | HR 76 | Temp 98.5°F | Resp 18 | Wt 238.3 lb

## 2015-04-08 VITALS — BP 125/80 | HR 70 | Resp 20

## 2015-04-08 DIAGNOSIS — Z5111 Encounter for antineoplastic chemotherapy: Secondary | ICD-10-CM | POA: Diagnosis not present

## 2015-04-08 DIAGNOSIS — D649 Anemia, unspecified: Secondary | ICD-10-CM | POA: Diagnosis not present

## 2015-04-08 DIAGNOSIS — F419 Anxiety disorder, unspecified: Secondary | ICD-10-CM | POA: Diagnosis not present

## 2015-04-08 DIAGNOSIS — C9110 Chronic lymphocytic leukemia of B-cell type not having achieved remission: Secondary | ICD-10-CM

## 2015-04-08 DIAGNOSIS — J45909 Unspecified asthma, uncomplicated: Secondary | ICD-10-CM | POA: Diagnosis not present

## 2015-04-08 DIAGNOSIS — E1165 Type 2 diabetes mellitus with hyperglycemia: Secondary | ICD-10-CM

## 2015-04-08 DIAGNOSIS — G549 Nerve root and plexus disorder, unspecified: Secondary | ICD-10-CM

## 2015-04-08 DIAGNOSIS — R531 Weakness: Secondary | ICD-10-CM | POA: Diagnosis not present

## 2015-04-08 DIAGNOSIS — R42 Dizziness and giddiness: Secondary | ICD-10-CM

## 2015-04-08 DIAGNOSIS — Z79899 Other long term (current) drug therapy: Secondary | ICD-10-CM | POA: Diagnosis not present

## 2015-04-08 DIAGNOSIS — Z803 Family history of malignant neoplasm of breast: Secondary | ICD-10-CM | POA: Diagnosis not present

## 2015-04-08 DIAGNOSIS — G8929 Other chronic pain: Secondary | ICD-10-CM

## 2015-04-08 DIAGNOSIS — Z7984 Long term (current) use of oral hypoglycemic drugs: Secondary | ICD-10-CM | POA: Insufficient documentation

## 2015-04-08 DIAGNOSIS — I10 Essential (primary) hypertension: Secondary | ICD-10-CM | POA: Diagnosis not present

## 2015-04-08 DIAGNOSIS — K649 Unspecified hemorrhoids: Secondary | ICD-10-CM | POA: Diagnosis not present

## 2015-04-08 DIAGNOSIS — R5383 Other fatigue: Secondary | ICD-10-CM | POA: Diagnosis not present

## 2015-04-08 DIAGNOSIS — Z5112 Encounter for antineoplastic immunotherapy: Secondary | ICD-10-CM | POA: Insufficient documentation

## 2015-04-08 LAB — CBC WITH DIFFERENTIAL/PLATELET
Basophils Absolute: 0.1 10*3/uL (ref 0–0.1)
Basophils Relative: 1 %
EOS ABS: 0.4 10*3/uL (ref 0–0.7)
EOS PCT: 7 %
HCT: 35.3 % (ref 35.0–47.0)
Hemoglobin: 11.7 g/dL — ABNORMAL LOW (ref 12.0–16.0)
LYMPHS ABS: 2.1 10*3/uL (ref 1.0–3.6)
Lymphocytes Relative: 40 %
MCH: 26.4 pg (ref 26.0–34.0)
MCHC: 33.3 g/dL (ref 32.0–36.0)
MCV: 79.3 fL — ABNORMAL LOW (ref 80.0–100.0)
MONO ABS: 0.4 10*3/uL (ref 0.2–0.9)
Monocytes Relative: 8 %
Neutro Abs: 2.3 10*3/uL (ref 1.4–6.5)
Neutrophils Relative %: 44 %
PLATELETS: 215 10*3/uL (ref 150–440)
RBC: 4.44 MIL/uL (ref 3.80–5.20)
RDW: 16.9 % — AB (ref 11.5–14.5)
WBC: 5.3 10*3/uL (ref 3.6–11.0)

## 2015-04-08 LAB — COMPREHENSIVE METABOLIC PANEL
ALT: 54 U/L (ref 14–54)
ANION GAP: 9 (ref 5–15)
AST: 50 U/L — ABNORMAL HIGH (ref 15–41)
Albumin: 4.1 g/dL (ref 3.5–5.0)
Alkaline Phosphatase: 71 U/L (ref 38–126)
BUN: 11 mg/dL (ref 6–20)
CHLORIDE: 105 mmol/L (ref 101–111)
CO2: 23 mmol/L (ref 22–32)
CREATININE: 0.69 mg/dL (ref 0.44–1.00)
Calcium: 9.6 mg/dL (ref 8.9–10.3)
GFR calc non Af Amer: >60 mL/min (ref >60)
Glucose, Bld: 236 mg/dL — ABNORMAL HIGH (ref 65–99)
POTASSIUM: 3.7 mmol/L (ref 3.5–5.1)
SODIUM: 137 mmol/L (ref 135–145)
Total Bilirubin: 0.7 mg/dL (ref 0.3–1.2)
Total Protein: 7 g/dL (ref 6.5–8.1)

## 2015-04-08 MED ORDER — HYDROCORTISONE ACETATE 25 MG RE SUPP: 25.0000 mg | Freq: Two times a day (BID) | RECTAL | Status: DC

## 2015-04-08 MED ORDER — SODIUM CHLORIDE 0.9 % IV SOLN
Freq: Once | INTRAVENOUS | Status: AC
  Administered 2015-04-08: 11:00:00 via INTRAVENOUS
  Filled 2015-04-08: qty 1000

## 2015-04-08 MED ORDER — SODIUM CHLORIDE 0.9 % IV SOLN
100.0000 mg/m2 | Freq: Once | INTRAVENOUS | Status: AC
  Administered 2015-04-08: 250 mg via INTRAVENOUS
  Filled 2015-04-08: qty 10

## 2015-04-08 MED ORDER — DIPHENHYDRAMINE HCL 50 MG/ML IJ SOLN
50.0000 mg | Freq: Once | INTRAMUSCULAR | Status: AC
  Administered 2015-04-08: 50 mg via INTRAVENOUS
  Filled 2015-04-08: qty 1

## 2015-04-08 MED ORDER — METHYLPREDNISOLONE SODIUM SUCC 125 MG IJ SOLR
125.0000 mg | Freq: Once | INTRAMUSCULAR | Status: AC
  Administered 2015-04-08: 125 mg via INTRAVENOUS
  Filled 2015-04-08: qty 2

## 2015-04-08 MED ORDER — SODIUM CHLORIDE 0.9 % IV SOLN
Freq: Once | INTRAVENOUS | Status: AC
  Administered 2015-04-08: 12:00:00 via INTRAVENOUS
  Filled 2015-04-08: qty 4

## 2015-04-08 MED ORDER — ACETAMINOPHEN 325 MG PO TABS
650.0000 mg | ORAL_TABLET | Freq: Once | ORAL | Status: AC
  Administered 2015-04-08: 650 mg via ORAL
  Filled 2015-04-08: qty 2

## 2015-04-08 MED ORDER — SODIUM CHLORIDE 0.9 % IV SOLN
375.0000 mg/m2 | Freq: Once | INTRAVENOUS | Status: AC
  Administered 2015-04-08: 900 mg via INTRAVENOUS
  Filled 2015-04-08: qty 80

## 2015-04-08 MED ORDER — FAMOTIDINE IN NACL 20-0.9 MG/50ML-% IV SOLN
20.0000 mg | Freq: Two times a day (BID) | INTRAVENOUS | Status: DC
  Administered 2015-04-08: 20 mg via INTRAVENOUS
  Filled 2015-04-08: qty 50

## 2015-04-08 MED ORDER — SODIUM CHLORIDE 0.9 % IV SOLN: 375.0000 mg/m2 | Freq: Once | INTRAVENOUS | Status: DC

## 2015-04-08 NOTE — Progress Notes (Signed)
Patient's hemorrhoids improved but they returned and she started using the Anusol cream that did help.  After her treatments she does not feel well so she plans on going to [place] and spend the Holidays with her family a few days after this treatment.

## 2015-04-09 ENCOUNTER — Inpatient Hospital Stay: Payer: Medicare Other

## 2015-04-09 VITALS — BP 156/93 | HR 100 | Temp 97.8°F | Resp 20

## 2015-04-09 DIAGNOSIS — Z5112 Encounter for antineoplastic immunotherapy: Secondary | ICD-10-CM | POA: Diagnosis not present

## 2015-04-09 DIAGNOSIS — C9110 Chronic lymphocytic leukemia of B-cell type not having achieved remission: Secondary | ICD-10-CM

## 2015-04-09 MED ORDER — SODIUM CHLORIDE 0.9 % IV SOLN
Freq: Once | INTRAVENOUS | Status: AC
  Administered 2015-04-09: 10:00:00 via INTRAVENOUS
  Filled 2015-04-09: qty 4

## 2015-04-09 MED ORDER — SODIUM CHLORIDE 0.9 % IV SOLN
Freq: Once | INTRAVENOUS | Status: AC
  Administered 2015-04-09: 10:00:00 via INTRAVENOUS
  Filled 2015-04-09: qty 1000

## 2015-04-09 MED ORDER — SODIUM CHLORIDE 0.9 % IV SOLN
100.0000 mg/m2 | Freq: Once | INTRAVENOUS | Status: AC
  Administered 2015-04-09: 250 mg via INTRAVENOUS
  Filled 2015-04-09: qty 10

## 2015-04-18 ENCOUNTER — Telehealth: Payer: Self-pay | Admitting: *Deleted

## 2015-04-18 MED ORDER — HYDROCORTISONE 2.5 % RE CREA: 1.0000 "application " | TOPICAL_CREAM | Freq: Two times a day (BID) | RECTAL | Status: DC

## 2015-04-18 NOTE — Telephone Encounter (Signed)
After discussing with Dr Grayland Ormond, Anusol [facility] Cream 2.5% e scribed to pharmacy in [place], Informed pt that she will have to contact the MD who ordered her Acyclovir for that refill.

## 2015-04-26 NOTE — Progress Notes (Signed)
[name]  Telephone:([phone]) [phone]  Fax:(336) [phone]     Tonya Myers DOB: [birth year]  MR#: [id number]  JSE#:[id number]  Patient Care Team: Minette Headland, NP as PCP - General (Hematology and Oncology)  CHIEF COMPLAINT:  Chief Complaint  Patient presents with  . CLL    INTERVAL HISTORY:  Patient returns to clinic today for further evaluation and consideration of cycle 4 of 4 of Rituxan plus Treanda. She had a reaction to Rituxan within the first 10-15 seconds of her cycle 1 infusion since then has been tolerating her treatments well.. She continues to have increased fatigue. She is also complaining of hemorrhoids, but admits they are improved as well. Her constipation has improved. She has no neurologic complaints.  She denies any fevers or weight loss. She has no chest pain or shortness of breath. She denies any nausea, vomiting, or diarrhea. She has no urinary complaints. Patient offers no further specific complaints today.   REVIEW OF SYSTEMS:   Review of Systems  Constitutional: Positive for malaise/fatigue. Negative for fever and weight loss.  Respiratory: Negative.   Cardiovascular: Negative.   Gastrointestinal: Positive for constipation. Negative for nausea and abdominal pain.  Genitourinary: Negative.   Musculoskeletal: Negative.   Neurological: Positive for weakness.    As per HPI. Otherwise, a complete review of systems is negatve.   PAST MEDICAL HISTORY: Past Medical History  Diagnosis Date  . Asthma   . Vertigo   . Chronic back pain   . Hypertension   . Diabetes mellitus without complication ([place])   . Acute hemorrhoid 03/11/2015    PAST SURGICAL HISTORY: Past Surgical History  Procedure Laterality Date  . Abdominal hysterectomy      FAMILY HISTORY Family History  Problem Relation Age of Onset  . Cancer Paternal Aunt   . Breast cancer Maternal Aunt 35    GYNECOLOGIC HISTORY:  No LMP recorded. Patient has had a hysterectomy.       ADVANCED DIRECTIVES:    HEALTH MAINTENANCE: Social History  Substance Use Topics  . Smoking status: Never Smoker   . Smokeless tobacco: Never Used  . Alcohol Use: 0.6 oz/week    1 Cans of beer per week     Colonoscopy:  PAP:  Bone density:  Lipid panel:  No Known Allergies  Current Outpatient Prescriptions  Medication Sig Dispense Refill  . acyclovir (ZOVIRAX) 400 MG tablet   0  . cyclobenzaprine (FLEXERIL) 5 MG tablet Take by mouth.    . dapagliflozin propanediol (FARXIGA) 5 MG TABS tablet Take by mouth.    . Diclofenac Sodium CR 100 MG 24 hr tablet Take by mouth.    . diltiazem (TIAZAC) 180 MG 24 hr capsule Take 180 mg by mouth daily.    Marland Kitchen ezetimibe (ZETIA) 10 MG tablet Take by mouth.    . fluticasone (FLONASE) 50 MCG/ACT nasal spray Place into the nose.    . furosemide (LASIX) 20 MG tablet Take by mouth.    . gabapentin (NEURONTIN) 600 MG tablet Take 600 mg by mouth at bedtime.    . irbesartan (AVAPRO) 300 MG tablet Take by mouth.    . levothyroxine (SYNTHROID, LEVOTHROID) 150 MCG tablet Take by mouth.    . lidocaine (LIDODERM) 5 % Place onto the skin.    Marland Kitchen LORazepam (ATIVAN) 1 MG tablet Take 1 tablet (1 mg total) by mouth once. Repeat x1 if necessary 30-60 mins prior to CT scan 2 tablet 0  . metFORMIN (GLUCOPHAGE) 1000 MG tablet  Take by mouth.    . rosuvastatin (CRESTOR) 10 MG tablet Take by mouth.    . sitaGLIPtin (JANUVIA) 100 MG tablet Take by mouth.    . hydrocortisone (ANUSOL-HC) 2.5 % rectal cream Place 1 application rectally 2 (two) times daily. 30 g 0   No current facility-administered medications for this visit.    OBJECTIVE: BP 123/85 mmHg  Pulse 76  Temp(Src) 98.5 F (36.9 C) (Tympanic)  Resp 18  Wt 238 lb 5.1 oz (108.1 kg)   Body mass index is 32.31 kg/(m^2).    ECOG FS:1 - Symptomatic but completely ambulatory  General: Well-developed, well-nourished, no acute distress. Refused examination of hemorrhoids. Eyes: Pink conjunctiva, anicteric  sclera. Lungs: Clear to auscultation bilaterally. Heart: Regular rate and rhythm. No rubs, murmurs, or gallops. Abdomen: Soft, nontender, nondistended. No organomegaly noted, normoactive bowel sounds. Musculoskeletal: No edema, cyanosis, or clubbing. Neuro: Alert, answering all questions appropriately. Cranial nerves grossly intact. Skin: No rashes or petechiae noted. Psych: Normal affect. Lymphatics: No cervical, clavicular, axillary LAD.   LAB RESULTS:  Appointment on 04/08/2015  Component Date Value Ref Range Status  . WBC 04/08/2015 5.3  3.6 - 11.0 K/uL Final  . RBC 04/08/2015 4.44  3.80 - 5.20 MIL/uL Final  . Hemoglobin 04/08/2015 11.7* 12.0 - 16.0 g/dL Final  . HCT 04/08/2015 35.3  35.0 - 47.0 % Final  . MCV 04/08/2015 79.3* 80.0 - 100.0 fL Final  . MCH 04/08/2015 26.4  26.0 - 34.0 pg Final  . MCHC 04/08/2015 33.3  32.0 - 36.0 g/dL Final  . RDW 04/08/2015 16.9* 11.5 - 14.5 % Final  . Platelets 04/08/2015 215  150 - 440 K/uL Final  . Neutrophils Relative % 04/08/2015 44   Final  . Neutro Abs 04/08/2015 2.3  1.4 - 6.5 K/uL Final  . Lymphocytes Relative 04/08/2015 40   Final  . Lymphs Abs 04/08/2015 2.1  1.0 - 3.6 K/uL Final  . Monocytes Relative 04/08/2015 8   Final  . Monocytes Absolute 04/08/2015 0.4  0.2 - 0.9 K/uL Final  . Eosinophils Relative 04/08/2015 7   Final  . Eosinophils Absolute 04/08/2015 0.4  0 - 0.7 K/uL Final  . Basophils Relative 04/08/2015 1   Final  . Basophils Absolute 04/08/2015 0.1  0 - 0.1 K/uL Final  . Sodium 04/08/2015 137  135 - 145 mmol/L Final  . Potassium 04/08/2015 3.7  3.5 - 5.1 mmol/L Final  . Chloride 04/08/2015 105  101 - 111 mmol/L Final  . CO2 04/08/2015 23  22 - 32 mmol/L Final  . Glucose, Bld 04/08/2015 236* 65 - 99 mg/dL Final  . BUN 04/08/2015 11  6 - 20 mg/dL Final  . Creatinine, Ser 04/08/2015 0.69  0.44 - 1.00 mg/dL Final  . Calcium 04/08/2015 9.6  8.9 - 10.3 mg/dL Final  . Total Protein 04/08/2015 7.0  6.5 - 8.1 g/dL Final  .  Albumin 04/08/2015 4.1  3.5 - 5.0 g/dL Final  . AST 04/08/2015 50* 15 - 41 U/L Final  . ALT 04/08/2015 54  14 - 54 U/L Final  . Alkaline Phosphatase 04/08/2015 71  38 - 126 U/L Final  . Total Bilirubin 04/08/2015 0.7  0.3 - 1.2 mg/dL Final  . GFR calc non Af Amer 04/08/2015 >60  >60 mL/min Final  . GFR calc Af Amer 04/08/2015 >60  >60 mL/min Final   Comment: (NOTE) The eGFR has been calculated using the CKD EPI equation. This calculation has not been validated in all clinical situations.  eGFR's persistently <60 mL/min signify possible Chronic Kidney Disease.   . Anion gap 04/08/2015 9  5 - 15 Final    STUDIES: No results found.  ASSESSMENT:  CLL.  PLAN:  1. CLL: CT scan results with widespread lymphadenopathy and mild splenomegaly. 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 Proceed with cycle 4 of 4 of Rituxan and Treanda today. Because of patient's reaction to Rituxan during her first cycle, will premedicate her heavily prior to her infusion. Return to clinic tomorrow for [place] only. Patient is going to [place] for an extended stay over the holidays. Therefore she will return to clinic in 6 weeks at which time we will reimage with CT scan.  2. Anxiety: Monitor, patient will require Ativan prior to her next CT scan. 3. Hyperglycemia: Continue current diabetic medications. Monitor closely with dexamethasone as premed. 4. Anemia: Mild, monitor. 5. Hemorrhoids. Monitor. Consider referral to either surgeon or GI for further evaluation.  Patient expressed understanding and was in agreement with this plan. She also understands that She can call clinic at any time with any questions, concerns, or complaints.   Lloyd Huger, MD   04/26/2015 8:18 AM

## 2015-05-19 ENCOUNTER — Other Ambulatory Visit: Payer: Self-pay

## 2015-05-20 ENCOUNTER — Ambulatory Visit: Payer: Medicare Other

## 2015-05-20 ENCOUNTER — Inpatient Hospital Stay: Payer: Medicare Other

## 2015-05-22 ENCOUNTER — Inpatient Hospital Stay: Payer: Medicare Other | Admitting: Oncology

## 2015-05-26 ENCOUNTER — Ambulatory Visit: Payer: Self-pay

## 2015-05-26 ENCOUNTER — Ambulatory Visit
Admission: RE | Admit: 2015-05-26 | Discharge: 2015-05-26 | Disposition: A | Discharge status: Home / Self Care | Payer: Medicare Other | Source: Ambulatory Visit | Attending: Oncology | Admitting: Oncology

## 2015-05-26 ENCOUNTER — Inpatient Hospital Stay: Payer: Medicare Other | Attending: Oncology

## 2015-05-26 DIAGNOSIS — C9110 Chronic lymphocytic leukemia of B-cell type not having achieved remission: Secondary | ICD-10-CM

## 2015-05-26 DIAGNOSIS — K760 Fatty (change of) liver, not elsewhere classified: Secondary | ICD-10-CM | POA: Diagnosis not present

## 2015-05-26 LAB — POCT I-STAT CREATININE: Creatinine, Ser: 0.7 mg/dL (ref 0.44–1.00)

## 2015-05-26 IMAGING — CT CT NECK W/ CM
2 of 3 series · 7 of 14 positions shown, 8 images · IV contrast (omnipaque)
Comparison: CT of the neck, chest, abdomen and pelvis on
[DATE].

CLINICAL DATA: 49-year-old female with history of chronic
lymphocytic leukemia (CLL) currently undergoing chemotherapy.

EXAM:
CT OF THE NECK WITH CONTRAST
CT OF THE CHEST WITH CONTRAST
CT OF THE ABDOMEN AND PELVIS WITH CONTRAST
TECHNIQUE: Multidetector CT imaging of the neck was performed with intravenous
contrast.; Multidetector CT imaging of the abdomen and pelvis was
performed following the standard protocol during bolus
administration of intravenous contrast.; Multidetector CT imaging of
the chest was performed following the standard protocol during bolus
administration of intravenous contrast.
CONTRAST:  100 mL of Omnipaque 350.

[Series 3: axial neck · axial · 0.61mm/px · z∈[-198,-82]mm · 3 of 117 slices shown]
[im 30/117  bone]
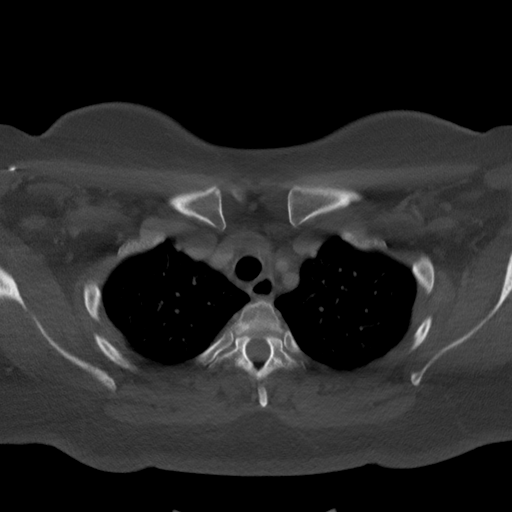
[im 59/117  bone]
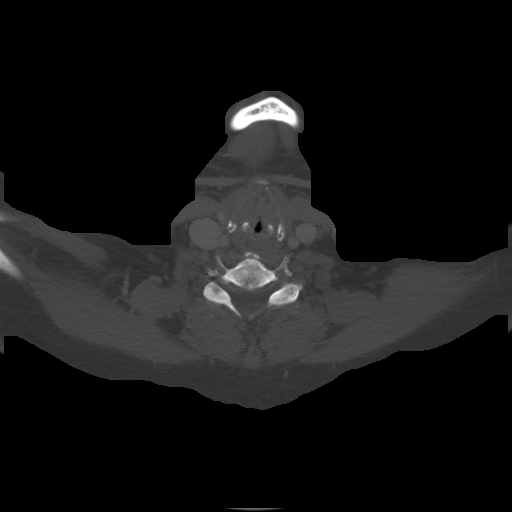
[im 88/117  bone]
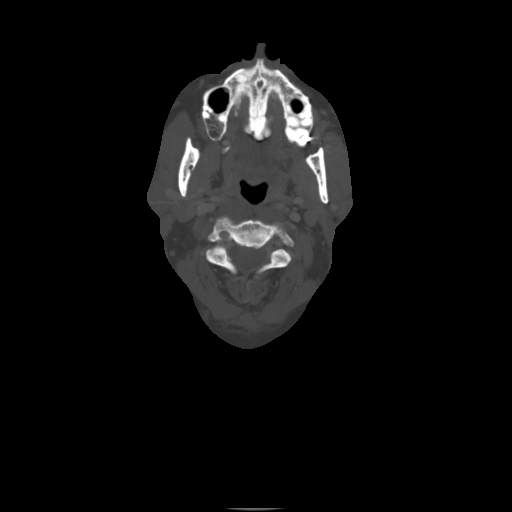

[Series 9: orthogonal ax · axial · 0.54mm/px · z∈[-265,-101]mm · 4 of 144 slices shown, 5 images]
[im 29/144  soft-tissue]
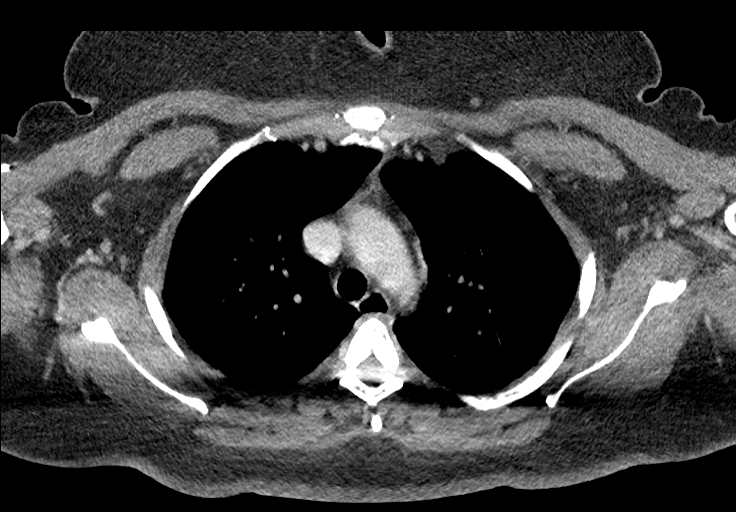
[im 29/144  bone]
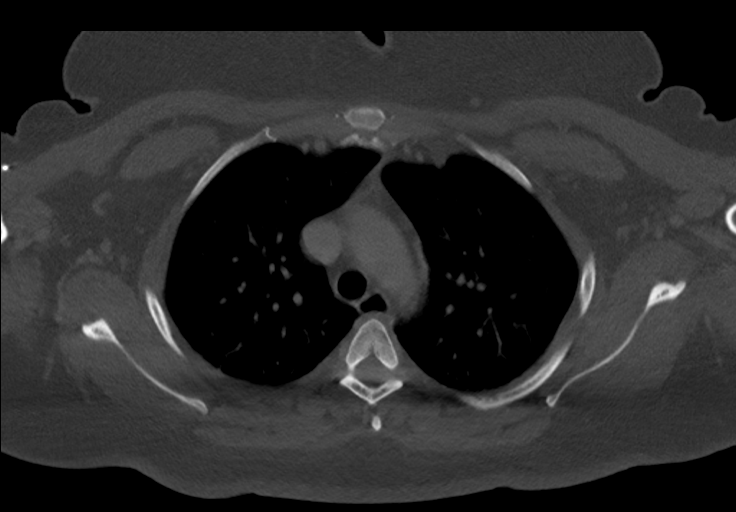
[im 58/144  bone]
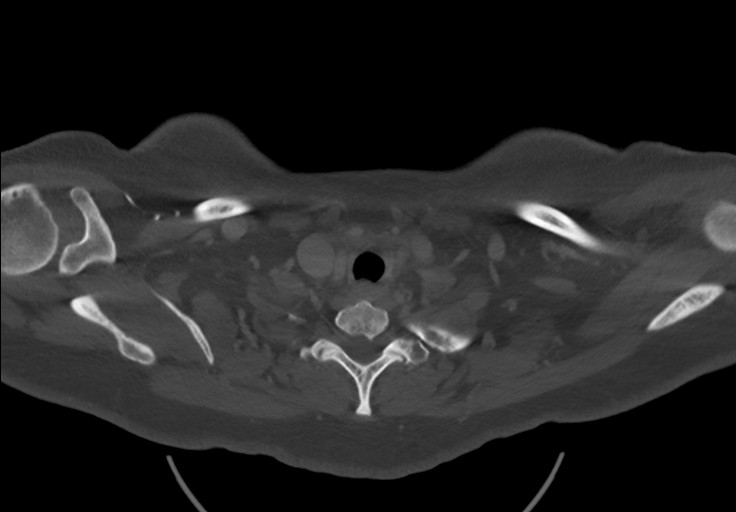
[im 86/144  bone]
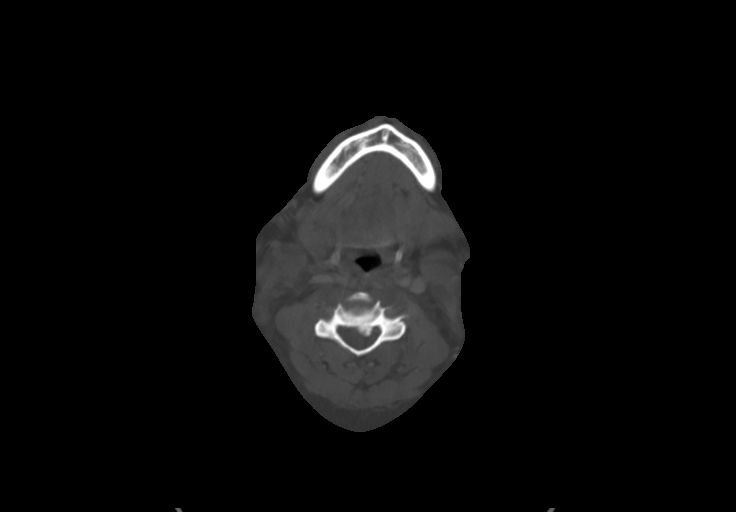
[im 115/144  bone]
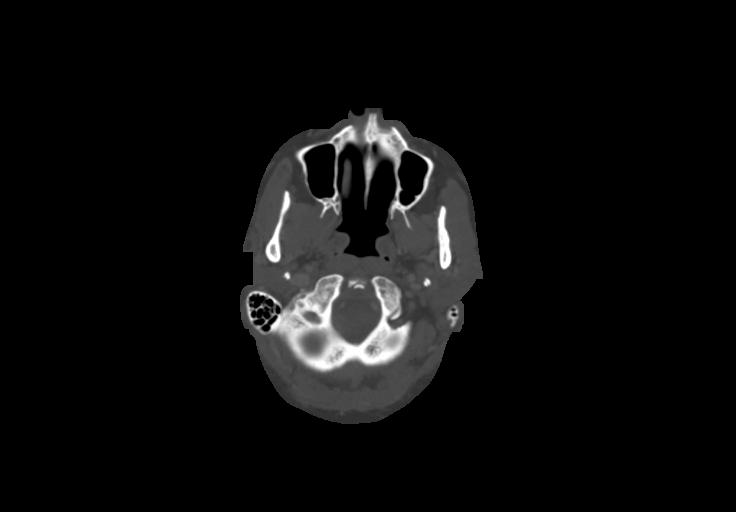

[7 of 14 positions shown; findings below may reference images not displayed]

FINDINGS: CT NECK FINDINGS

Previously noted cervical lymphadenopathy has completely resolved.
No new lymphadenopathy is noted. No abnormal soft tissue mass in the
neck. The pharynx, larynx, salivary glands and visualized
intracranial contents are all grossly normal in appearance.

CT CHEST FINDINGS

Mediastinum/Lymph Nodes: Heart size is normal. There is no
significant pericardial fluid, thickening or pericardial
calcification. No pathologically enlarged mediastinal in an or hilar
lymph nodes. Esophagus is unremarkable in appearance. Previously
noted bilateral axillary lymphadenopathy has nearly completely
resolved, with only some residual borderline enlarged and mildly
enlarged bilateral axillary lymph nodes measuring up to 11 mm in the
right axilla.

Lungs/Pleura: 6 mm right upper lobe pulmonary nodule (image 23 of
series 5) is unchanged. No other new suspicious appearing pulmonary
nodules or masses are otherwise noted. No acute consolidative
airspace disease. No pleural effusions.

Musculoskeletal/Soft Tissues: There are no aggressive appearing
lytic or blastic lesions noted in the visualized portions of the
skeleton.

CT ABDOMEN AND PELVIS FINDINGS

Hepatobiliary: Mild diffuse decreased attenuation throughout the
hepatic parenchyma, compatible with hepatic steatosis. No suspicious
cystic or solid hepatic lesions. No intra or extrahepatic biliary
ductal dilatation. Gallbladder is normal in appearance.

Pancreas: No pancreatic mass. No pancreatic ductal dilatation. No
pancreatic or peripancreatic fluid or inflammatory changes.

Spleen: Unremarkable. Specifically, spleen is normal in size
measuring up to 9.1 cm in length.

Adrenals/Urinary Tract: Bilateral kidneys and bilateral adrenal
glands are normal in appearance. No hydroureteronephrosis. Urinary
bladder is normal in appearance.

Stomach/Bowel: Normal appearance of the stomach. No pathologic
dilatation of small bowel or colon. Normal appendix.

Vascular/Lymphatic: Minimal atherosclerosis in the abdominal and
pelvic vasculature, without evidence of aneurysm or dissection.
Previously noted mesenteric lymphadenopathy has completely resolved,
as has the previously noted retroperitoneal lymphadenopathy.
Lymphadenopathy throughout the inguinal regions and along the pelvic
sidewall bilaterally has also resolved compared to the prior
examination.

Reproductive: Status post hysterectomy. Ovaries are unremarkable in
appearance.

Other: No significant volume of ascites.  No pneumoperitoneum.

Musculoskeletal: There are no aggressive appearing lytic or blastic
lesions noted in the visualized portions of the skeleton.
IMPRESSION: 1. Positive response to therapy demonstrated on today's examination
with near complete resolution of lymphadenopathy throughout the
neck, chest, abdomen and pelvis, with exception of some residual
borderline and mildly enlarged bilateral axillary lymph nodes
measuring up to 11 mm in the right axilla.
2. No new sites of lymphadenopathy noted. Spleen is also normal in
size.
3. Hepatic steatosis.
4. Additional incidental findings, as above.

## 2015-05-26 IMAGING — CT CT CHEST W/ CM
1 of 3 series · 13 of 31 positions shown, 17 images · IV contrast (APPLIED)
Comparison: CT of the neck, chest, abdomen and pelvis on
[DATE].

CLINICAL DATA: 49-year-old female with history of chronic
lymphocytic leukemia (CLL) currently undergoing chemotherapy.

EXAM:
CT OF THE NECK WITH CONTRAST
CT OF THE CHEST WITH CONTRAST
CT OF THE ABDOMEN AND PELVIS WITH CONTRAST
TECHNIQUE: Multidetector CT imaging of the neck was performed with intravenous
contrast.; Multidetector CT imaging of the abdomen and pelvis was
performed following the standard protocol during bolus
administration of intravenous contrast.; Multidetector CT imaging of
the chest was performed following the standard protocol during bolus
administration of intravenous contrast.
CONTRAST:  100 mL of Omnipaque 350.

[Series 3: cap with 2 · axial · 0.76mm/px · z∈[-764,-184]mm · 13 of 134 slices shown, 17 images]
[im 9/134  mediastinal]
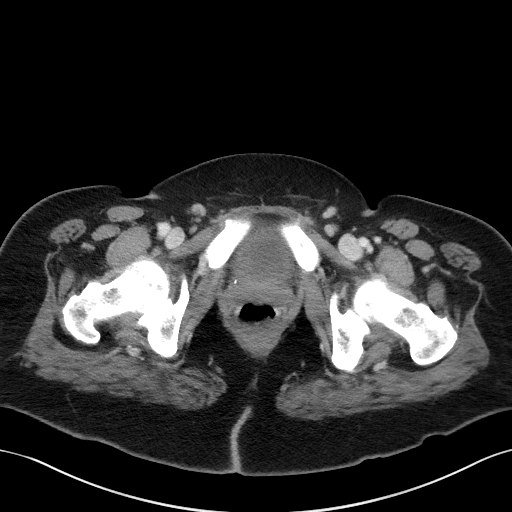
[im 9/134  lung]
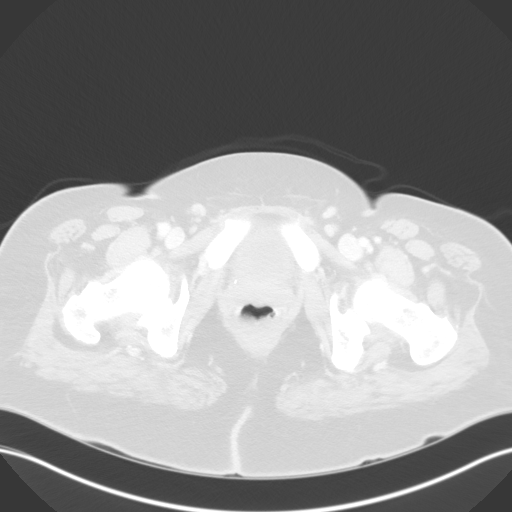
[im 18/134  lung]
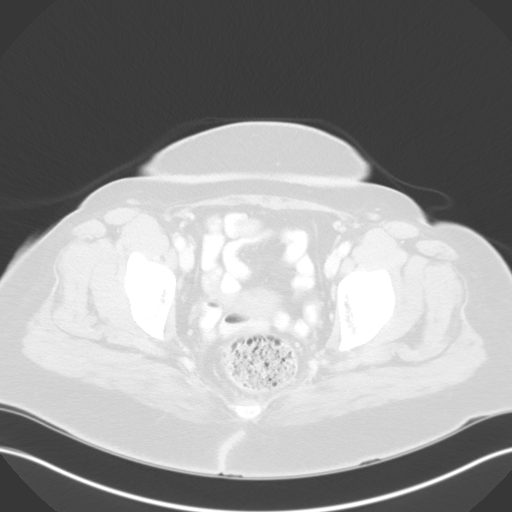
[im 36/134  lung]
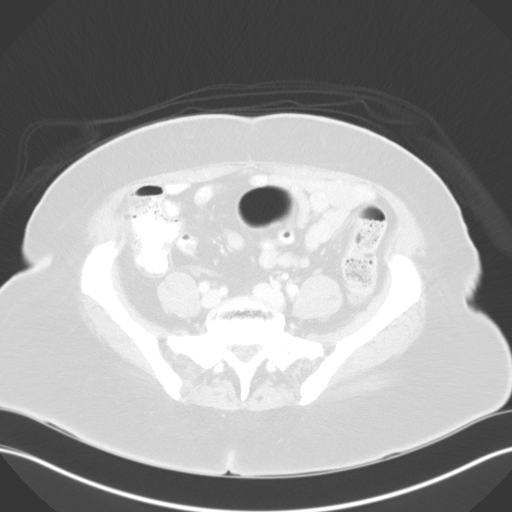
[im 45/134  lung]
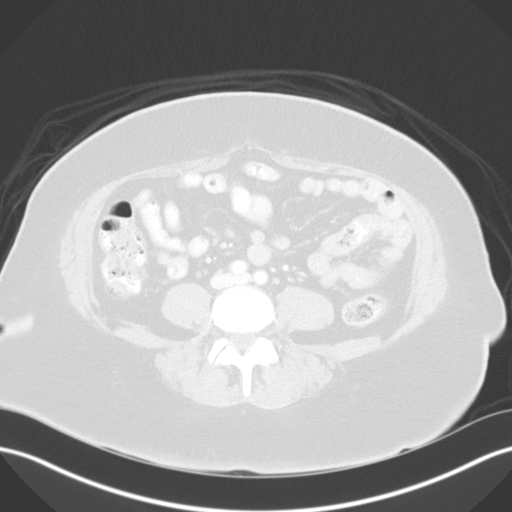
[im 54/134  mediastinal]
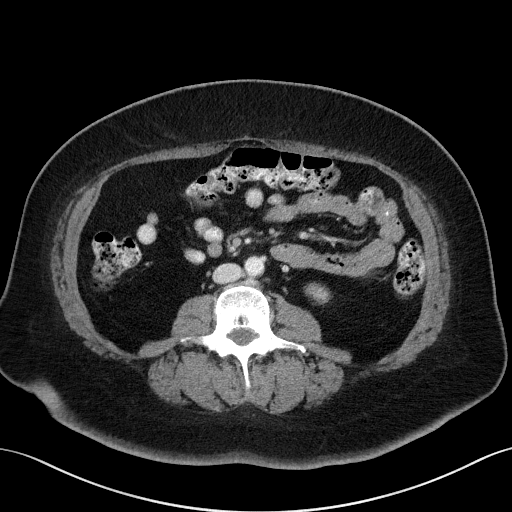
[im 54/134  lung]
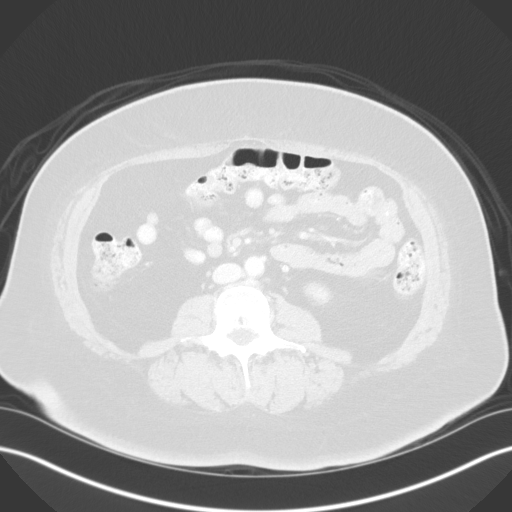
[im 63/134  lung]
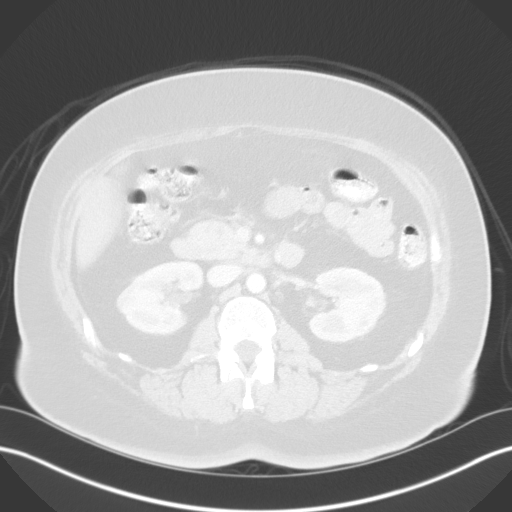
[im 67/134  lung]
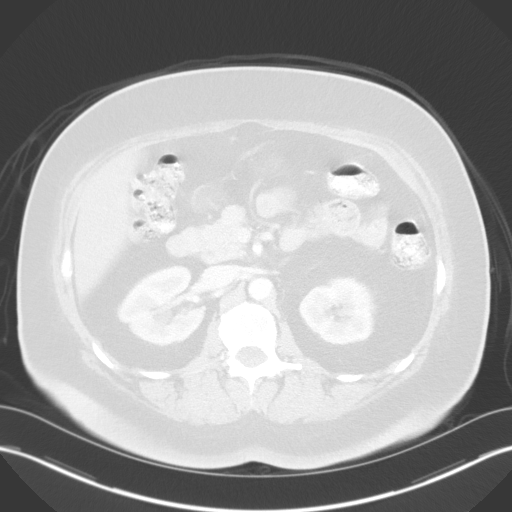
[im 71/134  lung]
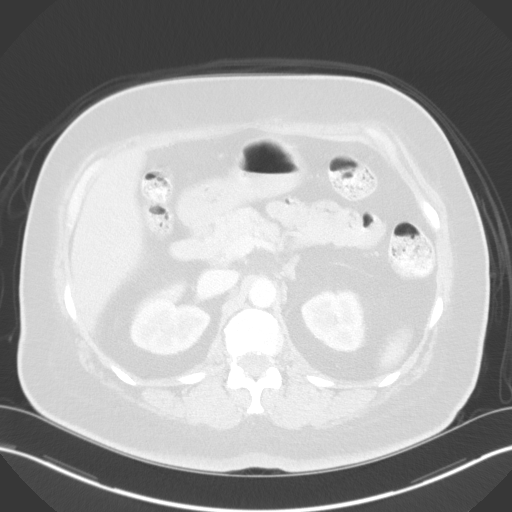
[im 80/134  mediastinal]
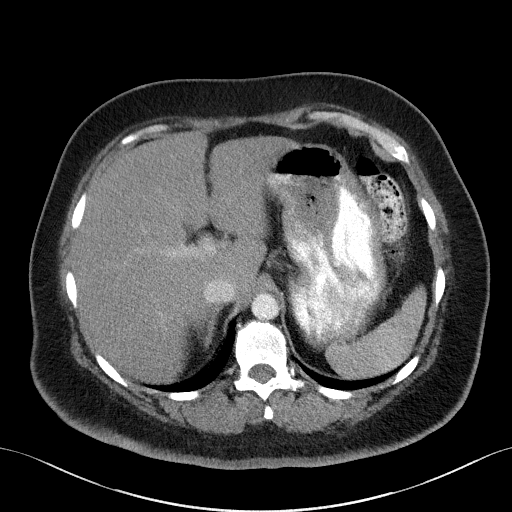
[im 80/134  lung]
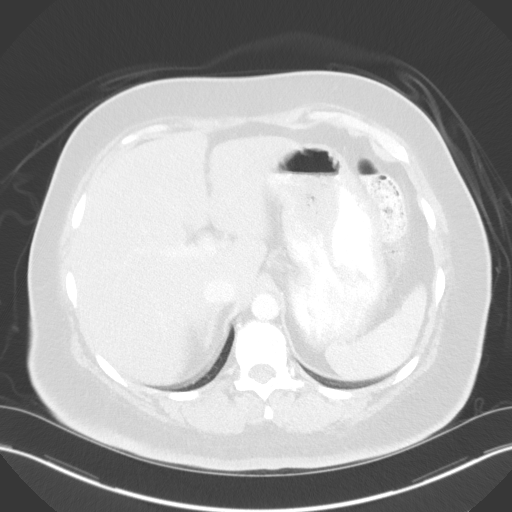
[im 89/134  lung]
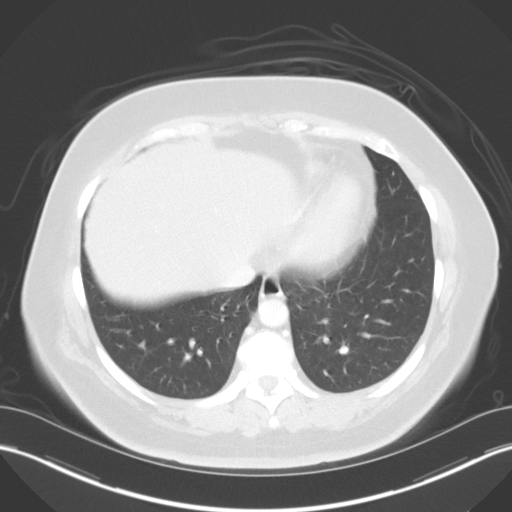
[im 98/134  lung]
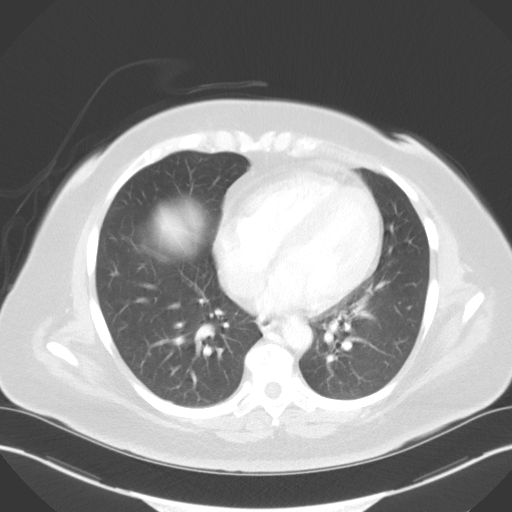
[im 116/134  lung]
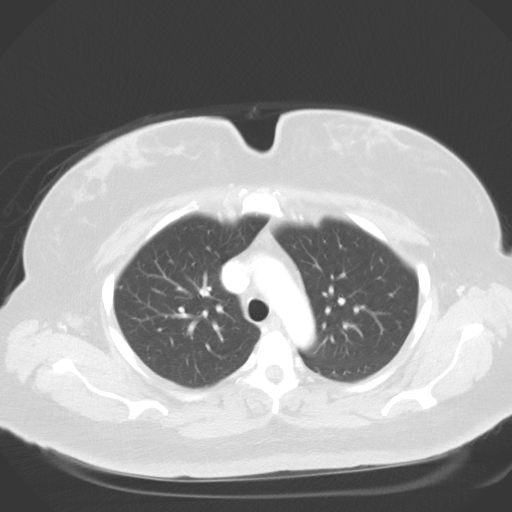
[im 125/134  mediastinal]
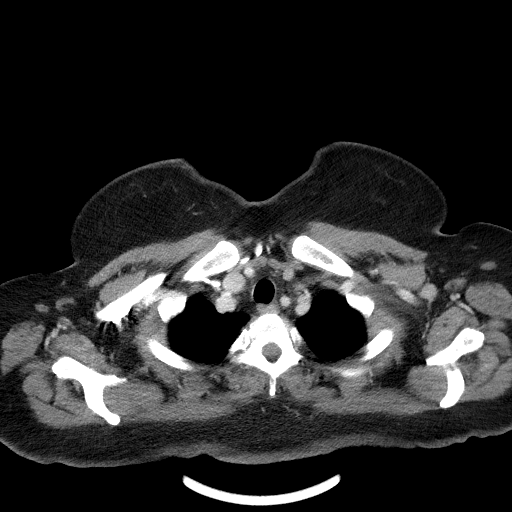
[im 125/134  lung]
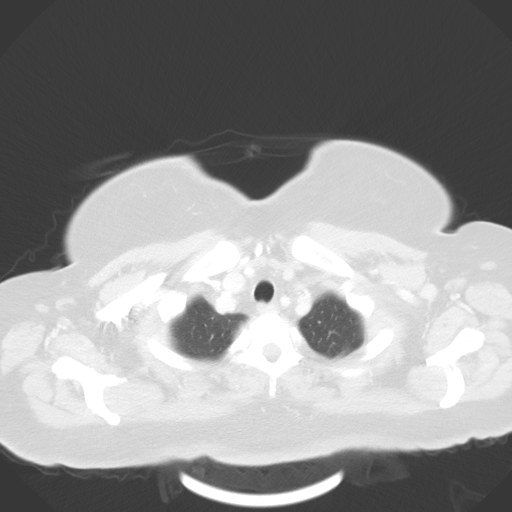

[13 of 31 positions shown; findings below may reference images not displayed]

FINDINGS: CT NECK FINDINGS

Previously noted cervical lymphadenopathy has completely resolved.
No new lymphadenopathy is noted. No abnormal soft tissue mass in the
neck. The pharynx, larynx, salivary glands and visualized
intracranial contents are all grossly normal in appearance.

CT CHEST FINDINGS

Mediastinum/Lymph Nodes: Heart size is normal. There is no
significant pericardial fluid, thickening or pericardial
calcification. No pathologically enlarged mediastinal in an or hilar
lymph nodes. Esophagus is unremarkable in appearance. Previously
noted bilateral axillary lymphadenopathy has nearly completely
resolved, with only some residual borderline enlarged and mildly
enlarged bilateral axillary lymph nodes measuring up to 11 mm in the
right axilla.

Lungs/Pleura: 6 mm right upper lobe pulmonary nodule (image 23 of
series 5) is unchanged. No other new suspicious appearing pulmonary
nodules or masses are otherwise noted. No acute consolidative
airspace disease. No pleural effusions.

Musculoskeletal/Soft Tissues: There are no aggressive appearing
lytic or blastic lesions noted in the visualized portions of the
skeleton.

CT ABDOMEN AND PELVIS FINDINGS

Hepatobiliary: Mild diffuse decreased attenuation throughout the
hepatic parenchyma, compatible with hepatic steatosis. No suspicious
cystic or solid hepatic lesions. No intra or extrahepatic biliary
ductal dilatation. Gallbladder is normal in appearance.

Pancreas: No pancreatic mass. No pancreatic ductal dilatation. No
pancreatic or peripancreatic fluid or inflammatory changes.

Spleen: Unremarkable. Specifically, spleen is normal in size
measuring up to 9.1 cm in length.

Adrenals/Urinary Tract: Bilateral kidneys and bilateral adrenal
glands are normal in appearance. No hydroureteronephrosis. Urinary
bladder is normal in appearance.

Stomach/Bowel: Normal appearance of the stomach. No pathologic
dilatation of small bowel or colon. Normal appendix.

Vascular/Lymphatic: Minimal atherosclerosis in the abdominal and
pelvic vasculature, without evidence of aneurysm or dissection.
Previously noted mesenteric lymphadenopathy has completely resolved,
as has the previously noted retroperitoneal lymphadenopathy.
Lymphadenopathy throughout the inguinal regions and along the pelvic
sidewall bilaterally has also resolved compared to the prior
examination.

Reproductive: Status post hysterectomy. Ovaries are unremarkable in
appearance.

Other: No significant volume of ascites.  No pneumoperitoneum.

Musculoskeletal: There are no aggressive appearing lytic or blastic
lesions noted in the visualized portions of the skeleton.
IMPRESSION: 1. Positive response to therapy demonstrated on today's examination
with near complete resolution of lymphadenopathy throughout the
neck, chest, abdomen and pelvis, with exception of some residual
borderline and mildly enlarged bilateral axillary lymph nodes
measuring up to 11 mm in the right axilla.
2. No new sites of lymphadenopathy noted. Spleen is also normal in
size.
3. Hepatic steatosis.
4. Additional incidental findings, as above.

## 2015-05-26 MED ORDER — IOHEXOL 350 MG/ML SOLN
100.0000 mL | Freq: Once | INTRAVENOUS | Status: AC | PRN
Start: 2015-05-26 — End: 2015-05-26
  Administered 2015-05-26: 100 mL via INTRAVENOUS

## 2015-05-28 ENCOUNTER — Inpatient Hospital Stay: Payer: Medicare Other | Attending: Oncology | Admitting: Oncology

## 2015-05-28 VITALS — BP 129/85 | HR 83 | Temp 99.1°F | Wt 229.9 lb

## 2015-05-28 DIAGNOSIS — F419 Anxiety disorder, unspecified: Secondary | ICD-10-CM | POA: Diagnosis not present

## 2015-05-28 DIAGNOSIS — E1165 Type 2 diabetes mellitus with hyperglycemia: Secondary | ICD-10-CM

## 2015-05-28 DIAGNOSIS — G8929 Other chronic pain: Secondary | ICD-10-CM

## 2015-05-28 DIAGNOSIS — R5383 Other fatigue: Secondary | ICD-10-CM | POA: Diagnosis not present

## 2015-05-28 DIAGNOSIS — K649 Unspecified hemorrhoids: Secondary | ICD-10-CM | POA: Diagnosis not present

## 2015-05-28 DIAGNOSIS — Z79899 Other long term (current) drug therapy: Secondary | ICD-10-CM | POA: Diagnosis not present

## 2015-05-28 DIAGNOSIS — M549 Dorsalgia, unspecified: Secondary | ICD-10-CM | POA: Insufficient documentation

## 2015-05-28 DIAGNOSIS — C9110 Chronic lymphocytic leukemia of B-cell type not having achieved remission: Secondary | ICD-10-CM | POA: Diagnosis present

## 2015-05-28 DIAGNOSIS — I10 Essential (primary) hypertension: Secondary | ICD-10-CM

## 2015-05-28 DIAGNOSIS — D649 Anemia, unspecified: Secondary | ICD-10-CM | POA: Diagnosis not present

## 2015-05-28 DIAGNOSIS — J45909 Unspecified asthma, uncomplicated: Secondary | ICD-10-CM | POA: Diagnosis not present

## 2015-05-28 DIAGNOSIS — K5900 Constipation, unspecified: Secondary | ICD-10-CM | POA: Diagnosis not present

## 2015-05-28 DIAGNOSIS — Z7984 Long term (current) use of oral hypoglycemic drugs: Secondary | ICD-10-CM

## 2015-05-28 DIAGNOSIS — R531 Weakness: Secondary | ICD-10-CM | POA: Diagnosis not present

## 2015-05-28 NOTE — Progress Notes (Signed)
Patient is still feeling weak, off balance, and no appetite.  Not feeling better since finishing treatment.

## 2015-05-29 DIAGNOSIS — A6004 Herpesviral vulvovaginitis: Secondary | ICD-10-CM | POA: Insufficient documentation

## 2015-06-01 NOTE — Progress Notes (Signed)
[facility]  [phone]  [phone]     Tonya Myers DOB: 1965-11-03  MR#: [id number]  [id number]  Patient Care Team: Minette Headland, NP as PCP - General (Hematology and Oncology)  CHIEF COMPLAINT:  Chief Complaint  Patient presents with  . CLL    INTERVAL HISTORY:  Patient returns to clinic today for further evaluation and discussion of her imaging results. She continues to have increased weakness and fatigue. She feels occasionally off balance. She continues to have a poor appetite. Her hemorrhoids seem to be getting worse and are unchanged. Patient states she does not feel any different since discontinuing treatment over 6 weeks ago.  She has no neurologic complaints.  She denies any fevers or night sweats. She has no chest pain or shortness of breath. She denies any nausea, vomiting, or diarrhea. She has no urinary complaints. Patient offers no further specific complaints today.   REVIEW OF SYSTEMS:   Review of Systems  Constitutional: Positive for malaise/fatigue. Negative for fever and weight loss.  Respiratory: Negative.   Cardiovascular: Negative.   Gastrointestinal: Positive for constipation. Negative for nausea and abdominal pain.  Genitourinary: Negative.   Musculoskeletal: Negative.   Neurological: Positive for weakness.    As per HPI. Otherwise, a complete review of systems is negatve.   PAST MEDICAL HISTORY: Past Medical History  Diagnosis Date  . Asthma   . Vertigo   . Chronic back pain   . Hypertension   . Diabetes mellitus without complication ([place])   . Acute hemorrhoid 03/11/2015    PAST SURGICAL HISTORY: Past Surgical History  Procedure Laterality Date  . Abdominal hysterectomy      FAMILY HISTORY Family History  Problem Relation Age of Onset  . Cancer Paternal Aunt   . Breast cancer Maternal Aunt 6    GYNECOLOGIC HISTORY:  No LMP recorded. Patient has had a hysterectomy.     ADVANCED  DIRECTIVES:    HEALTH MAINTENANCE: Social History  Substance Use Topics  . Smoking status: Never Smoker   . Smokeless tobacco: Never Used  . Alcohol Use: 0.6 oz/week    1 Cans of beer per week     Colonoscopy:  PAP:  Bone density:  Lipid panel:  No Known Allergies  Current Outpatient Prescriptions  Medication Sig Dispense Refill  . acyclovir (ZOVIRAX) 400 MG tablet   0  . cyclobenzaprine (FLEXERIL) 5 MG tablet Take by mouth.    . dapagliflozin propanediol (FARXIGA) 5 MG TABS tablet Take by mouth.    . Diclofenac Sodium CR 100 MG 24 hr tablet Take by mouth.    . diltiazem (TIAZAC) 180 MG 24 hr capsule Take 180 mg by mouth daily.    Marland Kitchen ezetimibe (ZETIA) 10 MG tablet Take by mouth.    . fluticasone (FLONASE) 50 MCG/ACT nasal spray Place into the nose.    . furosemide (LASIX) 20 MG tablet Take by mouth.    . gabapentin (NEURONTIN) 600 MG tablet Take 600 mg by mouth at bedtime.    . hydrocortisone (ANUSOL-HC) 2.5 % rectal cream Place 1 application rectally 2 (two) times daily. 30 g 0  . irbesartan (AVAPRO) 300 MG tablet Take by mouth.    . levothyroxine (SYNTHROID, LEVOTHROID) 150 MCG tablet Take by mouth.    . lidocaine (LIDODERM) 5 % Place onto the skin.    Marland Kitchen LORazepam (ATIVAN) 1 MG tablet Take 1 tablet (1 mg total) by mouth once. Repeat x1 if necessary 30-60 mins prior to  CT scan 2 tablet 0  . metFORMIN (GLUCOPHAGE) 1000 MG tablet Take by mouth.    . rosuvastatin (CRESTOR) 10 MG tablet Take by mouth.    . sitaGLIPtin (JANUVIA) 100 MG tablet Take by mouth.     No current facility-administered medications for this visit.    OBJECTIVE: BP 129/85 mmHg  Pulse 83  Temp(Src) 99.1 F (37.3 C) (Tympanic)  Wt 229 lb 15 oz (104.3 kg)   Body mass index is 31.18 kg/(m^2).    ECOG FS:1 - Symptomatic but completely ambulatory  General: Well-developed, well-nourished, no acute distress. Refused examination of hemorrhoids. Eyes: Pink conjunctiva, anicteric sclera. Lungs: Clear to  auscultation bilaterally. Heart: Regular rate and rhythm. No rubs, murmurs, or gallops. Abdomen: Soft, nontender, nondistended. No organomegaly noted, normoactive bowel sounds. Musculoskeletal: No edema, cyanosis, or clubbing. Neuro: Alert, answering all questions appropriately. Cranial nerves grossly intact. Skin: No rashes or petechiae noted. Psych: Normal affect. Lymphatics: No cervical, clavicular, axillary LAD.   LAB RESULTS:  Hospital Outpatient Visit on 05/26/2015  Component Date Value Ref Range Status  . Creatinine, Ser 05/26/2015 0.70  0.44 - 1.00 mg/dL Final    STUDIES: No results found.  ASSESSMENT:  CLL.  PLAN:  1. CLL: CT scan results from May 26, 2015 were reviewed independently with significant improvement of disease. Patient does not require additional treatment at this time. Patient has also requested no further CTs be done unless there is consideration of reinitiation of treatment. If patient in fact does need to be retreated, her she will need to be heavily pretreated since patient had a reaction to Rituxan during cycle 1. The remainder of her infusions proceeded without problem. Return to clinic in 3 months with repeat laboratory work and further evaluation. 2. Anxiety: Monitor, patient will require Ativan with any future CT scan. 3. Hyperglycemia: Continue current diabetic medications. Treatment per PCP. 4. Anemia: Mild, monitor. 5. Hemorrhoids. Monitor. Patient states she will discuss further with her primary care physician tomorrow. She likely will need a referral to either surgeon or GI for further evaluation.  Approximately 30 minutes was spent in discussion of which greater than 50% was consultation.  Patient expressed understanding and was in agreement with this plan. She also understands that She can call clinic at any time with any questions, concerns, or complaints.   Lloyd Huger, MD   06/01/2015 8:44 AM

## 2015-06-16 ENCOUNTER — Ambulatory Visit: Payer: Medicare Other | Admitting: Anesthesiology

## 2015-06-16 ENCOUNTER — Encounter
Admission: RE | Disposition: A | Discharge status: Home / Self Care | Payer: Self-pay | Source: Ambulatory Visit | Attending: Gastroenterology

## 2015-06-16 ENCOUNTER — Ambulatory Visit
Admission: RE | Admit: 2015-06-16 | Discharge: 2015-06-16 | Disposition: A | Discharge status: Home / Self Care | Payer: Medicare Other | Source: Ambulatory Visit | Attending: Gastroenterology | Admitting: Gastroenterology

## 2015-06-16 ENCOUNTER — Encounter: Payer: Self-pay | Admitting: *Deleted

## 2015-06-16 DIAGNOSIS — G8929 Other chronic pain: Secondary | ICD-10-CM | POA: Diagnosis not present

## 2015-06-16 DIAGNOSIS — E782 Mixed hyperlipidemia: Secondary | ICD-10-CM | POA: Diagnosis not present

## 2015-06-16 DIAGNOSIS — M1990 Unspecified osteoarthritis, unspecified site: Secondary | ICD-10-CM | POA: Insufficient documentation

## 2015-06-16 DIAGNOSIS — M549 Dorsalgia, unspecified: Secondary | ICD-10-CM | POA: Diagnosis not present

## 2015-06-16 DIAGNOSIS — E039 Hypothyroidism, unspecified: Secondary | ICD-10-CM | POA: Diagnosis not present

## 2015-06-16 DIAGNOSIS — F419 Anxiety disorder, unspecified: Secondary | ICD-10-CM | POA: Insufficient documentation

## 2015-06-16 DIAGNOSIS — Z794 Long term (current) use of insulin: Secondary | ICD-10-CM | POA: Diagnosis not present

## 2015-06-16 DIAGNOSIS — Z856 Personal history of leukemia: Secondary | ICD-10-CM | POA: Insufficient documentation

## 2015-06-16 DIAGNOSIS — K625 Hemorrhage of anus and rectum: Secondary | ICD-10-CM | POA: Diagnosis not present

## 2015-06-16 DIAGNOSIS — Z90710 Acquired absence of both cervix and uterus: Secondary | ICD-10-CM | POA: Insufficient documentation

## 2015-06-16 DIAGNOSIS — F329 Major depressive disorder, single episode, unspecified: Secondary | ICD-10-CM | POA: Insufficient documentation

## 2015-06-16 DIAGNOSIS — K640 First degree hemorrhoids: Secondary | ICD-10-CM | POA: Diagnosis not present

## 2015-06-16 DIAGNOSIS — K219 Gastro-esophageal reflux disease without esophagitis: Secondary | ICD-10-CM | POA: Diagnosis not present

## 2015-06-16 DIAGNOSIS — E7800 Pure hypercholesterolemia, unspecified: Secondary | ICD-10-CM | POA: Diagnosis not present

## 2015-06-16 DIAGNOSIS — K529 Noninfective gastroenteritis and colitis, unspecified: Secondary | ICD-10-CM | POA: Insufficient documentation

## 2015-06-16 DIAGNOSIS — I10 Essential (primary) hypertension: Secondary | ICD-10-CM | POA: Insufficient documentation

## 2015-06-16 DIAGNOSIS — J45909 Unspecified asthma, uncomplicated: Secondary | ICD-10-CM | POA: Insufficient documentation

## 2015-06-16 DIAGNOSIS — E1165 Type 2 diabetes mellitus with hyperglycemia: Secondary | ICD-10-CM | POA: Diagnosis not present

## 2015-06-16 DIAGNOSIS — Z79899 Other long term (current) drug therapy: Secondary | ICD-10-CM | POA: Diagnosis not present

## 2015-06-16 HISTORY — DX: Chronic lymphocytic leukemia of B-cell type not having achieved remission: C91.10

## 2015-06-16 HISTORY — DX: Depression, unspecified: F32.A

## 2015-06-16 HISTORY — PX: COLONOSCOPY WITH PROPOFOL: SHX5780

## 2015-06-16 HISTORY — DX: Hypothyroidism, unspecified: E03.9

## 2015-06-16 HISTORY — DX: Major depressive disorder, single episode, unspecified: F32.9

## 2015-06-16 HISTORY — DX: Anxiety disorder, unspecified: F41.9

## 2015-06-16 HISTORY — DX: Malignant (primary) neoplasm, unspecified: C80.1

## 2015-06-16 HISTORY — DX: Unspecified osteoarthritis, unspecified site: M19.90

## 2015-06-16 LAB — GLUCOSE, CAPILLARY: Glucose-Capillary: 245 mg/dL — ABNORMAL HIGH (ref 65–99)

## 2015-06-16 SURGERY — COLONOSCOPY WITH PROPOFOL
Anesthesia: General

## 2015-06-16 MED ORDER — FENTANYL CITRATE (PF) 100 MCG/2ML IJ SOLN
INTRAMUSCULAR | Status: DC | PRN
  Administered 2015-06-16: 50 ug via INTRAVENOUS

## 2015-06-16 MED ORDER — MIDAZOLAM HCL 2 MG/2ML IJ SOLN
INTRAMUSCULAR | Status: DC | PRN
  Administered 2015-06-16: 1 mg via INTRAVENOUS

## 2015-06-16 MED ORDER — SODIUM CHLORIDE 0.9 % IV SOLN
INTRAVENOUS | Status: DC
  Administered 2015-06-16: 1000 mL via INTRAVENOUS

## 2015-06-16 MED ORDER — LACTATED RINGERS IV SOLN
INTRAVENOUS | Status: DC | PRN
  Administered 2015-06-16: 11:00:00 via INTRAVENOUS

## 2015-06-16 MED ORDER — PROPOFOL 500 MG/50ML IV EMUL
INTRAVENOUS | Status: DC | PRN
  Administered 2015-06-16: 75 ug/kg/min via INTRAVENOUS

## 2015-06-16 NOTE — Op Note (Signed)
[facility] Patient Name: Tonya Myers Procedure Date: 06/16/2015 10:42 AM MRN: [id number] Account #: [id number] Date of Birth: May 22, 1965 Admit Type: Outpatient Age: 50 Room: [facility] ENDO ROOM 2 Gender: Female Note Status: Finalized Procedure:            Colonoscopy Indications:          Chronic diarrhea, Rectal bleeding Patient Profile:      This is a 50 year old female. Providers:            Gerrit Heck. Rayann Heman, MD Referring MD:         Minette Headland (Referring MD) Medicines:            Propofol per Anesthesia Complications:        No immediate complications. Procedure:            Pre-Anesthesia Assessment:                       - Prior to the procedure, a History and Physical was                        performed, and patient medications, allergies and                        sensitivities were reviewed. The patient's tolerance of                        previous anesthesia was reviewed.                       After obtaining informed consent, the colonoscope was                        passed under direct vision. Throughout the procedure,                        the patient's blood pressure, pulse, and oxygen                        saturations were monitored continuously. The Olympus                        PCF-H180AL colonoscope ( S#: [id number] ) was introduced                        through the anus and advanced to the the cecum,                        identified by appendiceal orifice and ileocecal valve.                        The colonoscopy was somewhat difficult due to                        significant looping. Successful completion of the                        procedure was aided by using manual pressure. The                        patient tolerated the procedure well. The quality of  the bowel preparation was excellent. Findings:      Internal hemorrhoids were found during retroflexion. The hemorrhoids       were  Grade I (internal hemorrhoids that do not prolapse).      The exam was otherwise without abnormality.      The perianal exam findings include non-thrombosed external hemorrhoids.      - Unable to intubate TI due to looping.      Biopsies for histology were taken with a cold forceps from the right       colon, left colon and rectum for evaluation of microscopic colitis. Impression:           - Internal hemorrhoids.                       - Non-thrombosed external hemorrhoids found on perianal                        exam.                       - Biopsies were taken with a cold forceps from the                        right colon, left colon and rectum for evaluation of                        microscopic colitis.                       - Bleeding likely hemorrhoidal. Recommendation:       - Observe patient in GI recovery unit.                       - High fiber diet.                       - Continue present medications.                       - Await pathology results.                       - Repeat colonoscopy in 10 years for screening purposes.                       - Return to GI clinic.                       - Try low fodmap diet, probiotic, as needed imodium.                       - The findings and recommendations were discussed with                        the patient.                       - The findings and recommendations were discussed with                        the patient's family. Procedure Code(s):    --- Professional ---  45380, Colonoscopy, flexible; with biopsy, single or                        multiple Diagnosis Code(s):    --- Professional ---                       K64.0, First degree hemorrhoids                       K64.4, Residual hemorrhoidal skin tags                       K52.9, Noninfective gastroenteritis and colitis,                        unspecified                       K62.5, Hemorrhage of anus and rectum CPT copyright 2016 American  Medical Association. All rights reserved. The codes documented in this report are preliminary and upon coder review may  be revised to meet current compliance requirements. Mellody Life, MD 06/16/2015 11:13:46 AM This report has been signed electronically. Number of Addenda: 0 Note Initiated On: 06/16/2015 10:42 AM Scope Withdrawal Time: 0 hours 8 minutes 55 seconds  Total Procedure Duration: 0 hours 20 minutes 1 second       [facility]

## 2015-06-16 NOTE — Anesthesia Postprocedure Evaluation (Signed)
Anesthesia Post Note  Patient: Tonya Myers  Procedure(s) Performed: Procedure(s) (LRB): COLONOSCOPY WITH PROPOFOL (N/A)  Patient location during evaluation: PACU Anesthesia Type: General Level of consciousness: awake and alert Pain management: pain level controlled Vital Signs Assessment: post-procedure vital signs reviewed and stable Respiratory status: spontaneous breathing, nonlabored ventilation, respiratory function stable and patient connected to nasal cannula oxygen Cardiovascular status: blood pressure returned to baseline and stable Postop Assessment: no signs of nausea or vomiting Anesthetic complications: no    Last Vitals:  Filed Vitals:   06/16/15 1135 06/16/15 1145  BP: 138/99 158/88  Pulse: 64 64  Temp:    Resp: 16 18    Last Pain: There were no vitals filed for this visit.               Martha Clan

## 2015-06-16 NOTE — Anesthesia Preprocedure Evaluation (Signed)
Anesthesia Evaluation  Patient identified by MRN, date of birth, ID band Patient awake    Reviewed: Allergy & Precautions, H&P , NPO status , Patient's Chart, lab work & pertinent test results, reviewed documented beta blocker date and time   Airway Mallampati: I  TM Distance: >3 FB Neck ROM: full    Dental no notable dental hx. (+) Teeth Intact   Pulmonary neg shortness of breath, asthma , neg sleep apnea, neg COPD, Recent URI , Residual Cough,    Pulmonary exam normal breath sounds clear to auscultation       Cardiovascular Exercise Tolerance: Good hypertension, (-) angina(-) CAD, (-) Past MI, (-) Cardiac Stents and (-) CABG Normal cardiovascular exam(-) dysrhythmias (-) Valvular Problems/Murmurs Rhythm:regular Rate:Normal     Neuro/Psych PSYCHIATRIC DISORDERS (Depression) negative neurological ROS     GI/Hepatic Neg liver ROS, GERD  ,  Endo/Other  diabetes, Poorly ControlledHypothyroidism   Renal/GU negative Renal ROS  negative genitourinary   Musculoskeletal   Abdominal   Peds  Hematology negative hematology ROS (+)   Anesthesia Other Findings Past Medical History:   Asthma                                                       Vertigo                                                      Chronic back pain                                            Hypertension                                                 Diabetes mellitus without complication ([facility])                 Acute hemorrhoid                                03/11/2015   Arthritis                                                    Hypothyroidism                                               Depression  Anxiety                                                      Cancer ([place])                                                 CLL (chronic lymphocytic leukemia) (H[facility])                      Reproductive/Obstetrics negative OB ROS                             Anesthesia Physical Anesthesia Plan  ASA: III  Anesthesia Plan: General   Post-op Pain Management:    Induction:   Airway Management Planned:   Additional Equipment:   Intra-op Plan:   Post-operative Plan:   Informed Consent: I have reviewed the patients History and Physical, chart, labs and discussed the procedure including the risks, benefits and alternatives for the proposed anesthesia with the patient or authorized representative who has indicated his/her understanding and acceptance.   Dental Advisory Given  Plan Discussed with: Anesthesiologist, CRNA and Surgeon  Anesthesia Plan Comments:         Anesthesia Quick Evaluation

## 2015-06-16 NOTE — Discharge Instructions (Signed)

## 2015-06-16 NOTE — H&P (Signed)
Primary Care Physician:  Minette Headland, NP  Pre-Procedure History & Physical: HPI:  Tonya Myers is a 50 y.o. female is here for an colonoscopy.   Past Medical History  Diagnosis Date  . Asthma   . Vertigo   . Chronic back pain   . Hypertension   . Diabetes mellitus without complication ([place])   . Acute hemorrhoid 03/11/2015  . Arthritis   . Hypothyroidism   . Depression   . Anxiety   . Cancer ([place])   . CLL (chronic lymphocytic leukemia) ([place])     Past Surgical History  Procedure Laterality Date  . Abdominal hysterectomy      Prior to Admission medications   Medication Sig Start Date End Date Taking? Authorizing Provider  acyclovir (ZOVIRAX) 400 MG tablet  04/03/15   Historical Provider, MD  cyclobenzaprine (FLEXERIL) 5 MG tablet Take by mouth.    Historical Provider, MD  dapagliflozin propanediol (FARXIGA) 5 MG TABS tablet Take by mouth. 01/14/15 01/14/16  Historical Provider, MD  Diclofenac Sodium CR 100 MG 24 hr tablet Take by mouth.    Historical Provider, MD  diltiazem (TIAZAC) 180 MG 24 hr capsule Take 180 mg by mouth daily.    Historical Provider, MD  ezetimibe (ZETIA) 10 MG tablet Take by mouth.    Historical Provider, MD  fluticasone (FLONASE) 50 MCG/ACT nasal spray Place into the nose. 12/26/14 12/26/15  Historical Provider, MD  furosemide (LASIX) 20 MG tablet Take by mouth.    Historical Provider, MD  hydrocortisone (ANUSOL-HC) 2.5 % rectal cream Place 1 application rectally 2 (two) times daily. 04/18/15   Lloyd Huger, MD  irbesartan (AVAPRO) 300 MG tablet Take by mouth.    Historical Provider, MD  levothyroxine (SYNTHROID, LEVOTHROID) 150 MCG tablet Take by mouth.    Historical Provider, MD  lidocaine (LIDODERM) 5 % Place onto the skin.    Historical Provider, MD  metFORMIN (GLUCOPHAGE) 1000 MG tablet Take by mouth.    Historical Provider, MD  rosuvastatin (CRESTOR) 10 MG tablet Take by mouth.    Historical Provider, MD  sitaGLIPtin (JANUVIA) 100 MG  tablet Take by mouth.    Historical Provider, MD    Allergies as of 06/11/2015  . (No Known Allergies)    Family History  Problem Relation Age of Onset  . Cancer Paternal Aunt   . Breast cancer Maternal Aunt 70    Social History   Social History  . Marital Status: Single    Spouse Name: N/A  . Number of Children: N/A  . Years of Education: N/A   Occupational History  . Not on file.   Social History Main Topics  . Smoking status: Never Smoker   . Smokeless tobacco: Never Used  . Alcohol Use: 0.6 oz/week    1 Cans of beer per week  . Drug Use: No  . Sexual Activity: Not on file   Other Topics Concern  . Not on file   Social History Narrative     Physical Exam: BP 157/100 mmHg  Pulse 77  Temp(Src) 98.5 F (36.9 C) (Oral)  Resp 20  Ht 6' (1.829 m)  SpO2 100% General:   Alert,  pleasant and cooperative in NAD Head:  Normocephalic and atraumatic. Neck:  Supple; no masses or thyromegaly. Lungs:  Clear throughout to auscultation.    Heart:  Regular rate and rhythm. Abdomen:  Soft, nontender and nondistended. Normal bowel sounds, without guarding, and without rebound.   Neurologic:  Alert and  oriented  x4;  grossly normal neurologically.  Impression/Plan: Tonya Myers is here for an colonoscopy to be performed for diarrhea, rectal bleeding  Risks, benefits, limitations, and alternatives regarding  colonoscopy have been reviewed with the patient.  Questions have been answered.  All parties agreeable.   [name], MD  06/16/2015, 10:35 AM

## 2015-06-16 NOTE — Transfer of Care (Signed)
Immediate Anesthesia Transfer of Care Note  Patient: Tonya Myers  Procedure(s) Performed: Procedure(s): COLONOSCOPY WITH PROPOFOL (N/A)  Patient Location: PACU  Anesthesia Type:General  Level of Consciousness: awake and alert   Airway & Oxygen Therapy: Patient Spontanous Breathing  Post-op Assessment: Report given to RN  Post vital signs: Reviewed and stable  Last Vitals:  Filed Vitals:   06/16/15 0957 06/16/15 1113  BP: 157/100 147/74  Pulse: 77 82  Temp: 36.9 C 37 C  Resp: 20 16    Complications: No apparent anesthesia complications

## 2015-06-17 ENCOUNTER — Encounter: Payer: Self-pay | Admitting: Gastroenterology

## 2015-06-17 LAB — SURGICAL PATHOLOGY

## 2015-07-29 ENCOUNTER — Ambulatory Visit: Payer: Medicare Other | Attending: Pain Medicine | Admitting: Pain Medicine

## 2015-07-29 ENCOUNTER — Encounter: Payer: Self-pay | Admitting: Pain Medicine

## 2015-07-29 VITALS — BP 153/98 | HR 66 | Temp 98.3°F | Resp 16 | Ht 72.0 in | Wt 222.0 lb

## 2015-07-29 DIAGNOSIS — K589 Irritable bowel syndrome without diarrhea: Secondary | ICD-10-CM | POA: Insufficient documentation

## 2015-07-29 DIAGNOSIS — M5136 Other intervertebral disc degeneration, lumbar region: Secondary | ICD-10-CM | POA: Insufficient documentation

## 2015-07-29 DIAGNOSIS — M5481 Occipital neuralgia: Secondary | ICD-10-CM

## 2015-07-29 DIAGNOSIS — M19011 Primary osteoarthritis, right shoulder: Secondary | ICD-10-CM

## 2015-07-29 DIAGNOSIS — M19012 Primary osteoarthritis, left shoulder: Secondary | ICD-10-CM | POA: Diagnosis not present

## 2015-07-29 DIAGNOSIS — M5030 Other cervical disc degeneration, unspecified cervical region: Secondary | ICD-10-CM | POA: Insufficient documentation

## 2015-07-29 DIAGNOSIS — C9110 Chronic lymphocytic leukemia of B-cell type not having achieved remission: Secondary | ICD-10-CM | POA: Diagnosis not present

## 2015-07-29 DIAGNOSIS — M5010 Cervical disc disorder with radiculopathy, unspecified cervical region: Secondary | ICD-10-CM

## 2015-07-29 DIAGNOSIS — M47812 Spondylosis without myelopathy or radiculopathy, cervical region: Secondary | ICD-10-CM | POA: Insufficient documentation

## 2015-07-29 DIAGNOSIS — R634 Abnormal weight loss: Secondary | ICD-10-CM | POA: Insufficient documentation

## 2015-07-29 DIAGNOSIS — M47816 Spondylosis without myelopathy or radiculopathy, lumbar region: Secondary | ICD-10-CM

## 2015-07-29 DIAGNOSIS — M79601 Pain in right arm: Secondary | ICD-10-CM | POA: Diagnosis present

## 2015-07-29 DIAGNOSIS — M51369 Other intervertebral disc degeneration, lumbar region without mention of lumbar back pain or lower extremity pain: Secondary | ICD-10-CM

## 2015-07-29 DIAGNOSIS — M533 Sacrococcygeal disorders, not elsewhere classified: Secondary | ICD-10-CM | POA: Diagnosis not present

## 2015-07-29 DIAGNOSIS — J45909 Unspecified asthma, uncomplicated: Secondary | ICD-10-CM | POA: Insufficient documentation

## 2015-07-29 DIAGNOSIS — M19019 Primary osteoarthritis, unspecified shoulder: Secondary | ICD-10-CM | POA: Insufficient documentation

## 2015-07-29 DIAGNOSIS — M542 Cervicalgia: Secondary | ICD-10-CM | POA: Diagnosis present

## 2015-07-29 NOTE — Patient Instructions (Addendum)
Continue present medication  F/U PCP Dr.Comett  for evaliation of  BP and general medical  condition  F/U surgical evaluation. May consider pending follow-up evaluations as discussed. We will obtain MRIs and nerve studies/electromyographic studies at this time  Ask the nurses and secretary the date of your cervical MRI and lumbar MRI and MRI of the right shoulder  F/U oncology as discussed  F/U neurological evaluation. Ask the nurses and secretary the date of nerve studies of the upper extremities and lower extremities  May consider radiofrequency rhizolysis or intraspinal procedures pending response to present treatment and F/U evaluation   Patient to call [facility] should patient have concerns prior to scheduled return appointment.

## 2015-07-29 NOTE — Progress Notes (Signed)
Safety precautions to be maintained throughout the outpatient stay will include: orient to surroundings, keep bed in low position, maintain call bell within reach at all times, provide assistance with transfer out of bed and ambulation.  

## 2015-07-29 NOTE — Progress Notes (Signed)
Subjective:    Patient ID: Tonya Myers, female    DOB: 1966-02-22, 50 y.o.   MRN: [id number]  HPI  The patient is a 50 year old female who comes to pain management for further evaluation and treatment at the request of Dr. Charlestine Massed. The patient states that her pain involves the neck and upper extremity especially the right upper extremity. The patient states that she has fallen twice and that she has also been involved in physical therapy which cause the pain to occur in the neck and right upper extremity. Patient stated that rehabilitation made her condition worse. The patient stated that she fell in November and that she was in physical therapy by the cancer center. The patient states that she has a diagnosis of chronic lymphocytic leukemia and is under the care of of Dr. Grayland Ormond. The patient stated that there was weakness of the right upper extremity as well as pain involving the right upper extremity and the region of the neck. The patient also admitted to pain of the lower back lower extremity region with weakness of the lower extremity as well. The patient described her pain as annoying intermittent nagging sharp shooting tingling sensation that was aggravated by any type of motion. The patient stated that the pain decreased with cold packs and hot packs and medications. We discussed the patient on today's visit and informed patient that we wish to proceed with further evaluation of patient and obtain cervical and lumbar MRIs as well as MRI of the right shoulder. We also informed patient we will obtain peripheral nerve conduction velocity/electromyographic studies of the upper extremities and lower extremity is to evaluate pain paresthesias and weakness of the upper extremities and lower extremities. The patient stated that she was without prior MRIs of these regions. The patient previously lived in [place] and is without evaluation and treatment by other physicians in the area. We will  obtain results of studies as mentioned and will consider patient for further evaluation and treatment pending results of studies. We may consider referring patient to tertiary center for further evaluation and treatment pending results of studies and follow-up evaluation of patient. All agreed with suggested treatment plan.    Review of Systems     Cardiovascular: Unremarkable   Pulmonary: Asthma  Neurological: Unremarkable  Psychological: Unremarkable  Gastrointestinal: Irritable bowel syndrome  Genitourinary: Unremarkable  Hematologic: Unremarkable  Endocrine: Unremarkable  Rheumatological: Unremarkable  Musculoskeletal: Unremarkable  Other significant: Weight loss Chronic lymphocytic leukemia      Objective:   Physical Exam  There was tenderness to palpation of the splenius capitis and occipitalis musculature regions palpation which reproduces moderate discomfort. Palpation over the cervical facet cervical paraspinal musculature region was attends to palpation of mild to moderate degree There was moderate tenderness of the trapezius levator scapula and rhomboid musculature regions. Palpation of the acromioclavicular and glenohumeral joint regions were attends to palpation right greater than the left with decreased range of motion of the right shoulder compared to the left shoulder with difficulty performed the drop test on the right compared to the left. No definite sensory deficit or dermatomal distribution was noted of the right upper extremity are of the left upper extremity Tinel and Phalen's maneuver were without increased pain of severe degree and there appeared to be slightly decreased grip strength right compared to the left. Palpation over the thoracic facet thoracic paraspinal musculature region was attends to palpation of moderate degree with no crepitus of the thoracic region noted. Palpation over  the lumbar paraspinal must reason lumbar facet region was  with tenderness to palpation of moderate degree with lateral bending rotation extension and palpation of the lumbar facets reproducing moderate discomfort. There was tenderness to palpation over the PSIS and PII S region of moderate degree with mild tenderness over the greater trochanteric region and iliotibial band region. Straight leg raise was tolerated to possibly 30 without increased pain with dorsiflexion noted. The knees were attends to palpation with negative anterior and posterior drawer signs without ballottement of the patella.. There was no increased warmth and erythema in the region of the knees noted. Patient was with mild difficulty attempted to stand on tiptoes and heels. No sensory deficit or dermatomal distribution detected. DTRs were difficult to elicit. There was negative clonus negative Homans. Abdomen was nontender with no costovertebral tenderness noted    Assessment & Plan:   Degenerative disc disease cervical spine  Cervical facet syndrome  Degenerative joint disease of shoulders  Bilateral occipital neuralgia  Degenerative disc disease lumbar spine  Lumbar facet syndrome  Sacroiliac joint dysfunction  Chronic lymphocytic leukemia      PLAN  Continue present medication  F/U PCP Dr.Cometto  for evaliation of  BP and general medical  condition  F/U surgical evaluation. May consider pending follow-up evaluations as discussed. We will obtain MRIs and nerve studies/electromyographic studies at this time  Ask the nurses and secretary the date of your cervical MRI and lumbar MRI and MRI of the right shoulder  F/U oncology as discussed  F/U neurological evaluation. Ask the nurses and secretary the date of nerve studies of the upper extremities and lower extremities  May consider radiofrequency rhizolysis or intraspinal procedures pending response to present treatment and F/U evaluation   Patient to call [facility] Center should patient have concerns  prior to scheduled return appointment.

## 2015-08-07 ENCOUNTER — Other Ambulatory Visit: Payer: Self-pay | Admitting: Pain Medicine

## 2015-08-27 ENCOUNTER — Inpatient Hospital Stay: Payer: Medicare Other

## 2015-08-27 ENCOUNTER — Inpatient Hospital Stay: Payer: Medicare Other | Admitting: Oncology

## 2015-08-28 ENCOUNTER — Ambulatory Visit: Payer: Medicare Other | Admitting: Pain Medicine

## 2015-09-19 ENCOUNTER — Ambulatory Visit: Payer: Medicare Other

## 2015-09-19 ENCOUNTER — Inpatient Hospital Stay: Admission: RE | Admit: 2015-09-19 | Payer: Medicare Other | Source: Ambulatory Visit

## 2015-09-24 ENCOUNTER — Inpatient Hospital Stay: Payer: Medicare Other

## 2015-09-24 ENCOUNTER — Inpatient Hospital Stay: Payer: Medicare Other | Attending: Oncology | Admitting: Oncology

## 2015-09-24 VITALS — BP 131/81 | HR 76 | Temp 98.2°F | Resp 20 | Wt 228.0 lb

## 2015-09-24 DIAGNOSIS — D649 Anemia, unspecified: Secondary | ICD-10-CM

## 2015-09-24 DIAGNOSIS — Z79899 Other long term (current) drug therapy: Secondary | ICD-10-CM

## 2015-09-24 DIAGNOSIS — R42 Dizziness and giddiness: Secondary | ICD-10-CM | POA: Diagnosis not present

## 2015-09-24 DIAGNOSIS — G8929 Other chronic pain: Secondary | ICD-10-CM

## 2015-09-24 DIAGNOSIS — E1165 Type 2 diabetes mellitus with hyperglycemia: Secondary | ICD-10-CM | POA: Diagnosis not present

## 2015-09-24 DIAGNOSIS — M129 Arthropathy, unspecified: Secondary | ICD-10-CM | POA: Diagnosis not present

## 2015-09-24 DIAGNOSIS — Z7984 Long term (current) use of oral hypoglycemic drugs: Secondary | ICD-10-CM | POA: Diagnosis not present

## 2015-09-24 DIAGNOSIS — R531 Weakness: Secondary | ICD-10-CM

## 2015-09-24 DIAGNOSIS — I10 Essential (primary) hypertension: Secondary | ICD-10-CM

## 2015-09-24 DIAGNOSIS — Z8719 Personal history of other diseases of the digestive system: Secondary | ICD-10-CM

## 2015-09-24 DIAGNOSIS — E039 Hypothyroidism, unspecified: Secondary | ICD-10-CM

## 2015-09-24 DIAGNOSIS — R5383 Other fatigue: Secondary | ICD-10-CM | POA: Diagnosis not present

## 2015-09-24 DIAGNOSIS — F418 Other specified anxiety disorders: Secondary | ICD-10-CM | POA: Diagnosis not present

## 2015-09-24 DIAGNOSIS — J45909 Unspecified asthma, uncomplicated: Secondary | ICD-10-CM | POA: Diagnosis not present

## 2015-09-24 DIAGNOSIS — M549 Dorsalgia, unspecified: Secondary | ICD-10-CM | POA: Diagnosis not present

## 2015-09-24 DIAGNOSIS — M25511 Pain in right shoulder: Secondary | ICD-10-CM | POA: Insufficient documentation

## 2015-09-24 DIAGNOSIS — Z809 Family history of malignant neoplasm, unspecified: Secondary | ICD-10-CM | POA: Diagnosis not present

## 2015-09-24 DIAGNOSIS — C9110 Chronic lymphocytic leukemia of B-cell type not having achieved remission: Secondary | ICD-10-CM

## 2015-09-24 DIAGNOSIS — Z803 Family history of malignant neoplasm of breast: Secondary | ICD-10-CM | POA: Diagnosis not present

## 2015-09-24 LAB — COMPREHENSIVE METABOLIC PANEL
ALBUMIN: 4.3 g/dL (ref 3.5–5.0)
ALT: 37 U/L (ref 14–54)
ANION GAP: 7 (ref 5–15)
AST: 35 U/L (ref 15–41)
Alkaline Phosphatase: 87 U/L (ref 38–126)
BILIRUBIN TOTAL: 0.6 mg/dL (ref 0.3–1.2)
BUN: 15 mg/dL (ref 6–20)
CALCIUM: 9.6 mg/dL (ref 8.9–10.3)
CO2: 26 mmol/L (ref 22–32)
Chloride: 105 mmol/L (ref 101–111)
Creatinine, Ser: 0.79 mg/dL (ref 0.44–1.00)
GLUCOSE: 256 mg/dL — AB (ref 65–99)
POTASSIUM: 3.8 mmol/L (ref 3.5–5.1)
Sodium: 138 mmol/L (ref 135–145)
TOTAL PROTEIN: 7.2 g/dL (ref 6.5–8.1)

## 2015-09-24 LAB — CBC WITH DIFFERENTIAL/PLATELET
BASOS PCT: 1 %
Basophils Absolute: 0 10*3/uL (ref 0–0.1)
Eosinophils Absolute: 0.2 10*3/uL (ref 0–0.7)
Eosinophils Relative: 3 %
HEMATOCRIT: 35.2 % (ref 35.0–47.0)
Hemoglobin: 11.9 g/dL — ABNORMAL LOW (ref 12.0–16.0)
LYMPHS ABS: 1.5 10*3/uL (ref 1.0–3.6)
Lymphocytes Relative: 21 %
MCH: 26.5 pg (ref 26.0–34.0)
MCHC: 33.8 g/dL (ref 32.0–36.0)
MCV: 78.5 fL — ABNORMAL LOW (ref 80.0–100.0)
MONO ABS: 0.5 10*3/uL (ref 0.2–0.9)
MONOS PCT: 7 %
NEUTROS ABS: 4.9 10*3/uL (ref 1.4–6.5)
Neutrophils Relative %: 68 %
Platelets: 335 10*3/uL (ref 150–440)
RBC: 4.48 MIL/uL (ref 3.80–5.20)
RDW: 15.7 % — AB (ref 11.5–14.5)
WBC: 7.2 10*3/uL (ref 3.6–11.0)

## 2015-09-24 LAB — LACTATE DEHYDROGENASE: LDH: 217 U/L — AB (ref 98–192)

## 2015-09-24 NOTE — Progress Notes (Signed)
[place]  [phone]  Fax:([phone]) [phone]     Tonya Myers DOB: 07-Sep-1965  MR#: [id number]  XEN#:[id number]  Patient Care Team: Minette Headland, NP as PCP - General (Hematology and Oncology)  CHIEF COMPLAINT:  Chief Complaint  Patient presents with  . CLL    Follow up    INTERVAL HISTORY:  Patient returns to clinic today for evaluation of lab work and further evaluation. She continues to have weakness and fatigue. She feels occasionally off balance. Her appetite has improved. Over the past 3 weeks she has been feeling better. She would like a referral to the CARE program. She has some right shoulder pain and 'a bladder problem' both unrelated to CLL and being followed by other physicians. She has no neurologic complaints.  She denies any fevers or night sweats. She has no chest pain or shortness of breath. She denies any nausea, vomiting, or diarrhea. Patient offers no further specific complaints today.   REVIEW OF SYSTEMS:   Review of Systems  Constitutional: Positive for malaise/fatigue. Negative for fever and weight loss.  Respiratory: Negative.   Cardiovascular: Negative.   Gastrointestinal: Negative for nausea, abdominal pain and constipation.  Genitourinary: Negative.   Musculoskeletal: Positive for joint pain.  Neurological: Positive for weakness.    As per HPI. Otherwise, a complete review of systems is negatve.   PAST MEDICAL HISTORY: Past Medical History  Diagnosis Date  . Asthma   . Vertigo   . Chronic back pain   . Hypertension   . Diabetes mellitus without complication ([place])   . Acute hemorrhoid 03/11/2015  . Arthritis   . Hypothyroidism   . Depression   . Anxiety   . Cancer ([place])   . CLL (chronic lymphocytic leukemia) ([place])     PAST SURGICAL HISTORY: Past Surgical History  Procedure Laterality Date  . Abdominal hysterectomy    . Colonoscopy with propofol N/A 06/16/2015    Procedure: COLONOSCOPY WITH PROPOFOL;   Surgeon: [name], MD;  Location: [facility] ENDOSCOPY;  Service: Endoscopy;  Laterality: N/A;    FAMILY HISTORY Family History  Problem Relation Age of Onset  . Cancer Paternal Aunt   . Breast cancer Maternal Aunt 73    GYNECOLOGIC HISTORY:  No LMP recorded. Patient has had a hysterectomy.     ADVANCED DIRECTIVES:    HEALTH MAINTENANCE: Social History  Substance Use Topics  . Smoking status: Never Smoker   . Smokeless tobacco: Never Used  . Alcohol Use: 0.6 oz/week    1 Cans of beer per week     No Known Allergies  Current Outpatient Prescriptions  Medication Sig Dispense Refill  . acyclovir (ZOVIRAX) 400 MG tablet Reported on 07/29/2015  0  . cyclobenzaprine (FLEXERIL) 5 MG tablet Take by mouth.    . dapagliflozin propanediol (FARXIGA) 5 MG TABS tablet Take 10 mg by mouth.     . Diclofenac Sodium CR 100 MG 24 hr tablet Take by mouth.    . diltiazem (TIAZAC) 180 MG 24 hr capsule Take 180 mg by mouth daily.    Marland Kitchen ezetimibe (ZETIA) 10 MG tablet Take by mouth.    . fluticasone (FLONASE) 50 MCG/ACT nasal spray Place into the nose. Reported on 07/29/2015    . furosemide (LASIX) 20 MG tablet Take by mouth. Reported on 07/29/2015    . hydrocortisone (ANUSOL-HC) 2.5 % rectal cream Place 1 application rectally 2 (two) times daily. 30 g 0  . irbesartan (AVAPRO) 300 MG tablet Take by  mouth.    . levothyroxine (SYNTHROID, LEVOTHROID) 150 MCG tablet Take by mouth.    . lidocaine (LIDODERM) 5 % Place onto the skin. Reported on 07/29/2015    . metFORMIN (GLUCOPHAGE) 1000 MG tablet Take by mouth 2 (two) times daily with a meal.     . rosuvastatin (CRESTOR) 10 MG tablet Take by mouth.    . sitaGLIPtin (JANUVIA) 100 MG tablet Take by mouth.     No current facility-administered medications for this visit.    OBJECTIVE: BP 131/81 mmHg  Pulse 76  Temp(Src) 98.2 F (36.8 C) (Tympanic)  Resp 20  Wt 227 lb 15.3 oz (103.4 kg)   Body mass index is 30.91 kg/(m^2).    ECOG FS:1 - Symptomatic  but completely ambulatory  General: Well-developed, well-nourished, no acute distress.  Eyes: Pink conjunctiva, anicteric sclera. Lungs: Clear to auscultation bilaterally. Heart: Regular rate and rhythm. No rubs, murmurs, or gallops. Abdomen: Soft, nontender, nondistended. No organomegaly noted, normoactive bowel sounds. Musculoskeletal: No edema, cyanosis, or clubbing. Neuro: Alert, answering all questions appropriately. Cranial nerves grossly intact. Skin: No rashes or petechiae noted. Psych: Normal affect. Lymphatics: No cervical, clavicular, axillary LAD.    LAB RESULTS:  Appointment on 09/24/2015  Component Date Value Ref Range Status  . WBC 09/24/2015 7.2  3.6 - 11.0 K/uL Final  . RBC 09/24/2015 4.48  3.80 - 5.20 MIL/uL Final  . Hemoglobin 09/24/2015 11.9* 12.0 - 16.0 g/dL Final  . HCT 09/24/2015 35.2  35.0 - 47.0 % Final  . MCV 09/24/2015 78.5* 80.0 - 100.0 fL Final  . MCH 09/24/2015 26.5  26.0 - 34.0 pg Final  . MCHC 09/24/2015 33.8  32.0 - 36.0 g/dL Final  . RDW 09/24/2015 15.7* 11.5 - 14.5 % Final  . Platelets 09/24/2015 335  150 - 440 K/uL Final  . Neutrophils Relative % 09/24/2015 68   Final  . Neutro Abs 09/24/2015 4.9  1.4 - 6.5 K/uL Final  . Lymphocytes Relative 09/24/2015 21   Final  . Lymphs Abs 09/24/2015 1.5  1.0 - 3.6 K/uL Final  . Monocytes Relative 09/24/2015 7   Final  . Monocytes Absolute 09/24/2015 0.5  0.2 - 0.9 K/uL Final  . Eosinophils Relative 09/24/2015 3   Final  . Eosinophils Absolute 09/24/2015 0.2  0 - 0.7 K/uL Final  . Basophils Relative 09/24/2015 1   Final  . Basophils Absolute 09/24/2015 0.0  0 - 0.1 K/uL Final  . Sodium 09/24/2015 138  135 - 145 mmol/L Final  . Potassium 09/24/2015 3.8  3.5 - 5.1 mmol/L Final  . Chloride 09/24/2015 105  101 - 111 mmol/L Final  . CO2 09/24/2015 26  22 - 32 mmol/L Final  . Glucose, Bld 09/24/2015 256* 65 - 99 mg/dL Final  . BUN 09/24/2015 15  6 - 20 mg/dL Final  . Creatinine, Ser 09/24/2015 0.79  0.44 -  1.00 mg/dL Final  . Calcium 09/24/2015 9.6  8.9 - 10.3 mg/dL Final  . Total Protein 09/24/2015 7.2  6.5 - 8.1 g/dL Final  . Albumin 09/24/2015 4.3  3.5 - 5.0 g/dL Final  . AST 09/24/2015 35  15 - 41 U/L Final  . ALT 09/24/2015 37  14 - 54 U/L Final  . Alkaline Phosphatase 09/24/2015 87  38 - 126 U/L Final  . Total Bilirubin 09/24/2015 0.6  0.3 - 1.2 mg/dL Final  . GFR calc non Af Amer 09/24/2015 >60  >60 mL/min Final  . GFR calc Af Amer 09/24/2015 >60  >60  mL/min Final   Comment: (NOTE) The eGFR has been calculated using the CKD EPI equation. This calculation has not been validated in all clinical situations. eGFR's persistently <60 mL/min signify possible Chronic Kidney Disease.   . Anion gap 09/24/2015 7  5 - 15 Final  . LDH 09/24/2015 217* 98 - 192 U/L Final    STUDIES: No results found.  ASSESSMENT:  CLL.  PLAN:  1. CLL: CT scan results from May 26, 2015 were reviewed independently with significant improvement of disease. Patient does not require additional treatment at this time. Patient has also requested no further CTs be done unless there is consideration of reinitiation of treatment. If patient in fact does need to be retreated, her she will need to be heavily pretreated since patient had a reaction to Rituxan during cycle 1. The remainder of her infusions proceeded without problem. Return to clinic in 3 months with repeat laboratory work and further evaluation. 2. Anxiety: Monitor, patient will require Ativan with any future CT scan. 3. Hyperglycemia: Continue current diabetic medications. Treatment per PCP. 4. Anemia: Mild, monitor.   Patient expressed understanding and was in agreement with this plan. She also understands that She can call clinic at any time with any questions, concerns, or complaints.   Mayra Reel, NP   09/24/2015 12:43 PM  Patient was seen and evaluated independently and I agree with the assessment and plan as dictated above.  Lloyd Huger, MD 09/29/2015 10:48 PM

## 2015-09-24 NOTE — Progress Notes (Signed)
Patient here today for follow up regarding CLL. Patient reports she is feeling better.

## 2015-10-27 DIAGNOSIS — E1165 Type 2 diabetes mellitus with hyperglycemia: Secondary | ICD-10-CM | POA: Insufficient documentation

## 2015-12-22 NOTE — Progress Notes (Signed)
[place]  [phone]) [phone]  Fax:(336) [phone]     Tonya Myers DOB: [birth year]  MR#: [id number]  JOI#:[id number]  Patient Care Team: Minette Headland, NP as PCP - General (Hematology and Oncology)  CHIEF COMPLAINT:  CLL  INTERVAL HISTORY:   Patient returns to clinic today for repeat laboratory work and further evaluation. She currently feels well and is asymptomatic. She does not complain of weakness or fatigue. She denies any fevers, night sweats, or weight loss. She continues to have shoulder pain which is unchanged.  She has no neurologic complaints. She has no chest pain or shortness of breath. She denies any nausea, vomiting, or diarrhea. She has no urinary complaints.  Patient offers no specific complaints today.   REVIEW OF SYSTEMS:   Review of Systems  Constitutional: Negative for fever, malaise/fatigue and weight loss.  Respiratory: Negative.  Negative for cough and shortness of breath.   Cardiovascular: Negative.  Negative for chest pain.  Gastrointestinal: Negative for abdominal pain, constipation and nausea.  Genitourinary: Negative.   Musculoskeletal: Positive for joint pain.  Neurological: Negative.  Negative for weakness.  Psychiatric/Behavioral: Negative.  Negative for depression. The patient is not nervous/anxious.     As per HPI. Otherwise, a complete review of systems is negative.   PAST MEDICAL HISTORY: Past Medical History:  Diagnosis Date  . Acute hemorrhoid 03/11/2015  . Anxiety   . Arthritis   . Asthma   . Cancer ([place])   . Chronic back pain   . CLL (chronic lymphocytic leukemia) ([place])   . Depression   . Diabetes mellitus without complication ([place])   . Hypertension   . Hypothyroidism   . Vertigo     PAST SURGICAL HISTORY: Past Surgical History:  Procedure Laterality Date  . ABDOMINAL HYSTERECTOMY    . COLONOSCOPY WITH PROPOFOL N/A 06/16/2015   Procedure: COLONOSCOPY WITH PROPOFOL;  Surgeon: [name],  MD;  Location: [facility] ENDOSCOPY;  Service: Endoscopy;  Laterality: N/A;    FAMILY HISTORY Family History  Problem Relation Age of Onset  . Cancer Paternal Aunt   . Breast cancer Maternal Aunt 67    GYNECOLOGIC HISTORY:  No LMP recorded. Patient has had a hysterectomy.     ADVANCED DIRECTIVES:    HEALTH MAINTENANCE: Social History  Substance Use Topics  . Smoking status: Never Smoker  . Smokeless tobacco: Never Used  . Alcohol use 0.6 oz/week    1 Cans of beer per week     No Known Allergies  Current Outpatient Prescriptions  Medication Sig Dispense Refill  . acyclovir (ZOVIRAX) 400 MG tablet Reported on 07/29/2015  0  . cyclobenzaprine (FLEXERIL) 5 MG tablet Take by mouth.    . dapagliflozin propanediol (FARXIGA) 5 MG TABS tablet Take 10 mg by mouth.     . Diclofenac Sodium CR 100 MG 24 hr tablet Take by mouth.    . diltiazem (TIAZAC) 180 MG 24 hr capsule Take 180 mg by mouth daily.    . Dulaglutide (TRULICITY [place]) Inject 1 Dose into the skin once a week.    . ezetimibe (ZETIA) 10 MG tablet Take by mouth.    . fluticasone (FLONASE) 50 MCG/ACT nasal spray Place into the nose. Reported on 07/29/2015    . furosemide (LASIX) 20 MG tablet Take by mouth. Reported on 07/29/2015    . hydrocortisone (ANUSOL-HC) 2.5 % rectal cream Place 1 application rectally 2 (two) times daily. 30 g 0  . irbesartan (AVAPRO) 300 MG tablet Take by  mouth.    . levothyroxine (SYNTHROID, LEVOTHROID) 150 MCG tablet Take by mouth.    . lidocaine (LIDODERM) 5 % Place onto the skin. Reported on 07/29/2015    . metFORMIN (GLUCOPHAGE) 1000 MG tablet Take by mouth 2 (two) times daily with a meal.     . rosuvastatin (CRESTOR) 10 MG tablet Take by mouth.     No current facility-administered medications for this visit.     OBJECTIVE: BP (!) 143/88 (BP Location: Left Arm, Patient Position: Sitting)   Pulse 80   Temp 97.2 F (36.2 C) (Tympanic)   Resp 18   Wt 226 lb 10.1 oz (102.8 kg)   BMI 30.74 kg/m    Body  mass index is 30.74 kg/m.    ECOG FS:1 - Symptomatic but completely ambulatory  General: Well-developed, well-nourished, no acute distress.  Eyes: Pink conjunctiva, anicteric sclera. Lungs: Clear to auscultation bilaterally. Heart: Regular rate and rhythm. No rubs, murmurs, or gallops. Abdomen: Soft, nontender, nondistended. No organomegaly noted, normoactive bowel sounds. Musculoskeletal: No edema, cyanosis, or clubbing. Neuro: Alert, answering all questions appropriately. Cranial nerves grossly intact. Skin: No rashes or petechiae noted. Psych: Normal affect. Lymphatics: No cervical, clavicular, axillary LAD.    LAB RESULTS:  Appointment on 12/24/2015  Component Date Value Ref Range Status  . WBC 12/24/2015 4.4  3.6 - 11.0 K/uL Final  . RBC 12/24/2015 4.42  3.80 - 5.20 MIL/uL Final  . Hemoglobin 12/24/2015 12.2  12.0 - 16.0 g/dL Final  . HCT 12/24/2015 35.5  35.0 - 47.0 % Final  . MCV 12/24/2015 80.3  80.0 - 100.0 fL Final  . MCH 12/24/2015 27.7  26.0 - 34.0 pg Final  . MCHC 12/24/2015 34.4  32.0 - 36.0 g/dL Final  . RDW 12/24/2015 15.5* 11.5 - 14.5 % Final  . Platelets 12/24/2015 306  150 - 440 K/uL Final  . Neutrophils Relative % 12/24/2015 65  % Final  . Neutro Abs 12/24/2015 2.9  1.4 - 6.5 K/uL Final  . Lymphocytes Relative 12/24/2015 31  % Final  . Lymphs Abs 12/24/2015 1.4  1.0 - 3.6 K/uL Final  . Monocytes Relative 12/24/2015 2  % Final  . Monocytes Absolute 12/24/2015 0.1* 0.2 - 0.9 K/uL Final  . Eosinophils Relative 12/24/2015 1  % Final  . Eosinophils Absolute 12/24/2015 0.0  0 - 0.7 K/uL Final  . Basophils Relative 12/24/2015 1  % Final  . Basophils Absolute 12/24/2015 0.1  0 - 0.1 K/uL Final  . Sodium 12/24/2015 138  135 - 145 mmol/L Final  . Potassium 12/24/2015 3.6  3.5 - 5.1 mmol/L Final  . Chloride 12/24/2015 109  101 - 111 mmol/L Final  . CO2 12/24/2015 23  22 - 32 mmol/L Final  . Glucose, Bld 12/24/2015 186* 65 - 99 mg/dL Final  . BUN 12/24/2015 16  6 -  20 mg/dL Final  . Creatinine, Ser 12/24/2015 0.70  0.44 - 1.00 mg/dL Final  . Calcium 12/24/2015 9.4  8.9 - 10.3 mg/dL Final  . Total Protein 12/24/2015 7.2  6.5 - 8.1 g/dL Final  . Albumin 12/24/2015 4.4  3.5 - 5.0 g/dL Final  . AST 12/24/2015 21  15 - 41 U/L Final  . ALT 12/24/2015 29  14 - 54 U/L Final  . Alkaline Phosphatase 12/24/2015 83  38 - 126 U/L Final  . Total Bilirubin 12/24/2015 0.6  0.3 - 1.2 mg/dL Final  . GFR calc non Af Amer 12/24/2015 >60  >60 mL/min Final  . GFR  calc Af Amer 12/24/2015 >60  >60 mL/min Final   Comment: (NOTE) The eGFR has been calculated using the CKD EPI equation. This calculation has not been validated in all clinical situations. eGFR's persistently <60 mL/min signify possible Chronic Kidney Disease.   . Anion gap 12/24/2015 6  5 - 15 Final    STUDIES: No results found.  ASSESSMENT: CLL  PLAN:   1. CLL: No evidence of disease. Patient's white blood cell count continues to be within normal limits.  CT scan results from May 26, 2015 were reviewed independently with significant improvement of disease. Patient does not require additional treatment at this time. Patient has also requested no further CTs be done unless there is consideration of reinitiation of treatment. If patient in fact does need to be retreated, her she will need to be heavily pretreated since patient had a reaction to Rituxan during cycle 1. The remainder of her infusions proceeded without problem. Return to clinic in 3 months with repeat laboratory work and then in 6 months for laboratory work and further evaluation. 2. Anxiety: Improved. Patient will require Ativan with any future CT scan. 3. Hyperglycemia: Continue current diabetic medications. Treatment per PCP. 4. Anemia: Resolved. Patient had a colonoscopy in February 2017 that was reported as normal.  Patient expressed understanding and was in agreement with this plan. She also understands that She can call clinic at any  time with any questions, concerns, or complaints.   Lloyd Huger, MD   12/27/2015 8:39 AM

## 2015-12-24 ENCOUNTER — Inpatient Hospital Stay: Payer: Medicare Other

## 2015-12-24 ENCOUNTER — Encounter (INDEPENDENT_AMBULATORY_CARE_PROVIDER_SITE_OTHER): Payer: Self-pay

## 2015-12-24 ENCOUNTER — Inpatient Hospital Stay: Payer: Medicare Other | Attending: Oncology | Admitting: Oncology

## 2015-12-24 VITALS — BP 143/88 | HR 80 | Temp 97.2°F | Resp 18 | Wt 226.6 lb

## 2015-12-24 DIAGNOSIS — F419 Anxiety disorder, unspecified: Secondary | ICD-10-CM | POA: Insufficient documentation

## 2015-12-24 DIAGNOSIS — C9111 Chronic lymphocytic leukemia of B-cell type in remission: Secondary | ICD-10-CM | POA: Diagnosis not present

## 2015-12-24 DIAGNOSIS — R42 Dizziness and giddiness: Secondary | ICD-10-CM

## 2015-12-24 DIAGNOSIS — Z7984 Long term (current) use of oral hypoglycemic drugs: Secondary | ICD-10-CM

## 2015-12-24 DIAGNOSIS — Z8719 Personal history of other diseases of the digestive system: Secondary | ICD-10-CM

## 2015-12-24 DIAGNOSIS — E1165 Type 2 diabetes mellitus with hyperglycemia: Secondary | ICD-10-CM | POA: Diagnosis not present

## 2015-12-24 DIAGNOSIS — E039 Hypothyroidism, unspecified: Secondary | ICD-10-CM | POA: Diagnosis not present

## 2015-12-24 DIAGNOSIS — Z79899 Other long term (current) drug therapy: Secondary | ICD-10-CM | POA: Insufficient documentation

## 2015-12-24 DIAGNOSIS — M2550 Pain in unspecified joint: Secondary | ICD-10-CM

## 2015-12-24 DIAGNOSIS — M549 Dorsalgia, unspecified: Secondary | ICD-10-CM | POA: Insufficient documentation

## 2015-12-24 DIAGNOSIS — J45909 Unspecified asthma, uncomplicated: Secondary | ICD-10-CM | POA: Diagnosis not present

## 2015-12-24 DIAGNOSIS — C9110 Chronic lymphocytic leukemia of B-cell type not having achieved remission: Secondary | ICD-10-CM

## 2015-12-24 DIAGNOSIS — I10 Essential (primary) hypertension: Secondary | ICD-10-CM | POA: Insufficient documentation

## 2015-12-24 DIAGNOSIS — F329 Major depressive disorder, single episode, unspecified: Secondary | ICD-10-CM | POA: Diagnosis not present

## 2015-12-24 LAB — CBC WITH DIFFERENTIAL/PLATELET
Basophils Absolute: 0.1 10*3/uL (ref 0–0.1)
Basophils Relative: 1 %
EOS ABS: 0 10*3/uL (ref 0–0.7)
EOS PCT: 1 %
HCT: 35.5 % (ref 35.0–47.0)
Hemoglobin: 12.2 g/dL (ref 12.0–16.0)
LYMPHS ABS: 1.4 10*3/uL (ref 1.0–3.6)
LYMPHS PCT: 31 %
MCH: 27.7 pg (ref 26.0–34.0)
MCHC: 34.4 g/dL (ref 32.0–36.0)
MCV: 80.3 fL (ref 80.0–100.0)
MONOS PCT: 2 %
Monocytes Absolute: 0.1 10*3/uL — ABNORMAL LOW (ref 0.2–0.9)
Neutro Abs: 2.9 10*3/uL (ref 1.4–6.5)
Neutrophils Relative %: 65 %
PLATELETS: 306 10*3/uL (ref 150–440)
RBC: 4.42 MIL/uL (ref 3.80–5.20)
RDW: 15.5 % — ABNORMAL HIGH (ref 11.5–14.5)
WBC: 4.4 10*3/uL (ref 3.6–11.0)

## 2015-12-24 LAB — COMPREHENSIVE METABOLIC PANEL
ALBUMIN: 4.4 g/dL (ref 3.5–5.0)
ALK PHOS: 83 U/L (ref 38–126)
ALT: 29 U/L (ref 14–54)
ANION GAP: 6 (ref 5–15)
AST: 21 U/L (ref 15–41)
BILIRUBIN TOTAL: 0.6 mg/dL (ref 0.3–1.2)
BUN: 16 mg/dL (ref 6–20)
CALCIUM: 9.4 mg/dL (ref 8.9–10.3)
CO2: 23 mmol/L (ref 22–32)
CREATININE: 0.7 mg/dL (ref 0.44–1.00)
Chloride: 109 mmol/L (ref 101–111)
GFR calc non Af Amer: >60 mL/min (ref >60)
GLUCOSE: 186 mg/dL — AB (ref 65–99)
Potassium: 3.6 mmol/L (ref 3.5–5.1)
SODIUM: 138 mmol/L (ref 135–145)
TOTAL PROTEIN: 7.2 g/dL (ref 6.5–8.1)

## 2015-12-24 NOTE — Patient Instructions (Signed)
Survivorship Care Plan visit completed.  Treatment summary reviewed and given to patient.  ASCO answers booklet reviewed and given to patient.  CARE program and Cancer Transitions discussed with patient along with other resources cancer center offers to patients and caregivers.  Patient verbalized understanding.    

## 2015-12-24 NOTE — Progress Notes (Signed)
States is feeling well. Offers no complaints. 

## 2016-01-22 ENCOUNTER — Ambulatory Visit: Payer: Medicare Other | Attending: Adult Health

## 2016-01-22 DIAGNOSIS — M542 Cervicalgia: Secondary | ICD-10-CM

## 2016-01-22 NOTE — Therapy (Signed)
[place] MAIN [employer] SERVICES [address] [place], [place], [address] Phone: [phone]   Fax:  [phone]  Physical Therapy Treatment  Patient Details  Name: Tonya Myers MRN: [id number] Date of Birth: Sep 26, 1965 No Data Recorded  Encounter Date: 01/22/2016    Past Medical History:  Diagnosis Date  . Acute hemorrhoid 03/11/2015  . Anxiety   . Arthritis   . Asthma   . Cancer ([place])   . Chronic back pain   . CLL (chronic lymphocytic leukemia) ([place])   . Depression   . Diabetes mellitus without complication ([place])   . Hypertension   . Hypothyroidism   . Vertigo     Past Surgical History:  Procedure Laterality Date  . ABDOMINAL HYSTERECTOMY    . COLONOSCOPY WITH PROPOFOL N/A 06/16/2015   Procedure: COLONOSCOPY WITH PROPOFOL;  Surgeon: [name], MD;  Location: [facility] ENDOSCOPY;  Service: Endoscopy;  Laterality: N/A;    There were no vitals filed for this visit.    PT/OT/SLP Screening Form   Time: in 1320    Time out 1343   Complaint Right sided neck and UE pain.  Past Medical Hx:  Diabetes, HTN, High cholesterol, hypothyroidism, chronic lymphatic leukemia Injury Date:01/15/16  Pain Scale: 10/10 Patient's phone number:   Hx (this occurrence):  During use of the UE/LE ergometer and lifting a box from the floor, patient experiences increased pain along lateral  R neck, UE and upper trap pain. Numbness, tingling and pain radiating into the lateral aspect of the R UE along the forearm into the 4th and 5th digit.    Assessment: Cervical radiculopathy and with possible brachial plexus involvement secondary to dermatomal pain patterns.   Recommendations:   Full PT evaluation  Comments:    []  Patient would benefit from an MD referral [x]  Patient would benefit from a full PT/OT/ SLP evaluation and treatment. []  No intervention recommended at this  time.                                       Patient will benefit from skilled therapeutic intervention in order to improve the following deficits and impairments:     Visit Diagnosis: No diagnosis found.     Problem List Patient Active Problem List   Diagnosis Date Noted  . DDD (degenerative disc disease), cervical 07/29/2015  . Cervical disc disorder with radiculopathy of cervical region 07/29/2015  . Cervical facet syndrome 07/29/2015  . DDD (degenerative disc disease), lumbar 07/29/2015  . Facet syndrome, lumbar 07/29/2015  . Bilateral occipital neuralgia 07/29/2015  . Sacroiliac joint dysfunction 07/29/2015  . DJD of shoulder 07/29/2015  . Acute hemorrhoid 03/11/2015  . CLL (chronic lymphocytic leukemia) ([place]) 01/15/2015    Blythe Stanford 01/22/2016, 1:19 PM  [place] MAIN [facility] SERVICES [address] [place], [place], [address] Phone: [phone]   Fax:  [phone]  Name: Tonya Myers MRN: [id number] Date of Birth: February 26, 1966

## 2016-02-02 ENCOUNTER — Ambulatory Visit: Payer: Medicare Other | Admitting: Physical Therapy

## 2016-02-03 ENCOUNTER — Ambulatory Visit: Payer: Medicare Other | Attending: Adult Health | Admitting: Physical Therapy

## 2016-02-03 ENCOUNTER — Ambulatory Visit: Payer: Medicare Other | Admitting: Physical Therapy

## 2016-02-03 ENCOUNTER — Encounter: Payer: Self-pay | Admitting: Physical Therapy

## 2016-02-03 DIAGNOSIS — G8929 Other chronic pain: Secondary | ICD-10-CM | POA: Diagnosis present

## 2016-02-03 DIAGNOSIS — M25511 Pain in right shoulder: Secondary | ICD-10-CM | POA: Insufficient documentation

## 2016-02-03 DIAGNOSIS — M542 Cervicalgia: Secondary | ICD-10-CM | POA: Diagnosis present

## 2016-02-03 NOTE — Patient Instructions (Signed)
[email or web address]  Supine R shoulder AAROM, 3x10, 1x/day

## 2016-02-03 NOTE — Therapy (Signed)
[place] [facility] [address], [place], [address] Phone: [phone]   Fax:  [phone]  Physical Therapy Evaluation  Patient Details  Name: Tonya Myers MRN: [id number] Date of Birth: 17-May-1965 Referring Provider: Charlestine Massed, NP  Encounter Date: 02/03/2016      PT End of Session - 02/03/16 1504    Visit Number 1   Number of Visits 13   Date for PT Re-Evaluation 03/02/16   PT Start Time 1350   PT Stop Time 1450   PT Time Calculation (min) 60 min   Activity Tolerance Patient limited by pain   Behavior During Therapy [facility] for tasks assessed/performed      Past Medical History:  Diagnosis Date  . Acute hemorrhoid 03/11/2015  . Anxiety   . Arthritis   . Asthma   . Cancer ([place])   . Chronic back pain   . CLL (chronic lymphocytic leukemia) ([place])   . Depression   . Diabetes mellitus without complication ([place])   . Hypertension   . Hypothyroidism   . Vertigo     Past Surgical History:  Procedure Laterality Date  . ABDOMINAL HYSTERECTOMY    . COLONOSCOPY WITH PROPOFOL N/A 06/16/2015   Procedure: COLONOSCOPY WITH PROPOFOL;  Surgeon: [name], MD;  Location: [facility] ENDOSCOPY;  Service: Endoscopy;  Laterality: N/A;    There were no vitals filed for this visit.       Subjective Assessment - 02/03/16 1446    Subjective R sided neck pain and RUE radicular pain   Pertinent History Pt reports having neck and RUE pain which has been going on for 5 weeks. She reports she was exercising on a bike and she "heard a snap" in her R shoulder and has had neck and shoulder pain ever since. She states she has pain down her entire RUE with tingling in thumb and index finger. 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 Her sleep has been affected by this, only sleeping about 2 hours/night due to the pain. She denies any medications that have helped her pain. She states that he pain is worse as the day goes on. She reports heat did help her pain and helps her fall asleep. She states she can pinpoint the pain in her neck and shoulder. She states any kind of movement, writing, resting arm down by her side, lifting garbage, cooking etc. all aggravate her pain. 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 She enjoys fishing and cooking and wants to get back to that. She reports that the pain is a constant 10 and it is never better than that. Pt is right handed and she reports that her cancer is "under control."   Limitations Lifting;Writing;House hold activities   Patient Stated Goals get back to fishing and cooking    Currently in Pain? Yes   Pain Score 10-Worst pain ever   Pain Location Neck   Pain Orientation Right   Pain Descriptors / Indicators Constant   Pain Type Chronic pain   Pain Onset More than a month ago   Pain Frequency Constant   Aggravating Factors  Any movement of UEs aggravate neck pain   Multiple Pain Sites Yes   Pain  Score 10   Pain Location Shoulder   Pain Orientation Right   Pain Descriptors / Indicators Constant   Pain Type Chronic pain   Pain Onset More than a month ago   Pain Frequency Constant   Aggravating Factors  movement of neck or RUE         SENSATION C2 diminished on R C3 diminished on R C8 increased tingling on R  WNL on L throughout  AROM  Left Right  Shoulder flexion  73, painful and felt weak  Shoulder extension    Shoulder abduction  49, painful and felt weak  Shoulder IR/ER    Elbow flexion    Elbow extension    Wrist flexion    Wrist extension            Cervical flexion 23, pain in shoulder  Cervical extension 21, pain in neck   Cervical rotation 50, pain in R neck 32, pain in R shoulder  Cervical lateral flexion 50% loss,  pain in hand 25% loss, pain in neck   WNL throughout rest of RUE and LUE  PROM: Cervical with OP: painful in all directions R shoulder flexion approximately 100 deg before pt had increased pain   STRENGTH (on scale of 0-5/5)  Left Right  Shoulder flexion 5 Not tested  Shoulder extension    Shoulder abduction 5 Not tested  Shoulder IR/ER 5/5 4/4  Elbow flexion 5 5  Elbow extension 5 5  Wrist flexion 5 5  Wrist extension 5 5  Grip 24# 1#  Pinch grip  8.5# 0.5#   Cervical isometrics: R rotation: pain in hand L rotation: pain on R neck Extension and flexion: pain in neck   PALPATION Increased soft tissue restrictions and TTP of cervical and thoracic erector spinae, periscapular musculature,  Attempted Grade 1 CPA mobs of thoracic spine but pt reported increased R hand pain with light pressure over T8-T6, with the reported symptoms increasing at proximal segments Did not assess cervical CPAs as pt had increased pain from light pressure in thoracic spine  POSTURE Increased forward head posture, thoracic kyphosis, decreased lumbar lordosis in sitting       PT Education - 02/03/16 1504    Education provided Yes   Education Details HEP, POC   Person(s) Educated Patient   Methods Explanation   Comprehension Verbalized understanding             PT Long Term Goals - 02/04/16 1012      PT LONG TERM GOAL #1   Title Pt will have improved QuickDASH score by >10% to demonstrate decreased pain and improved overall function.   Time 6   Period Weeks   Status New     PT LONG TERM GOAL #2   Title Pt will demonstrate improved R shoulder AROM to 120 deg flexion and abduction with 2/10 pain or less to improve function at home and demonstrate improved overall function.   Baseline 73 deg flexion, 49 deg abd   Time 6   Period Weeks   Status New     PT LONG TERM GOAL #3   Title Pt will be independent with HEP to promote recovery and decrease risk of reinjury.   Time 6    Period Weeks   Status New               Plan - 02/03/16 1505    Clinical Impression Statement Pt is 50 YO F who presents to therapy with c/o R  sided neck pain with RUE radicular symptoms. She has deficits in strength, cervical and shoulder ROM, cervical and thoracic soft tissue restrictions, and increased pain with all shoulder and neck movement actively and passively. Pt demonstrated hyperalgesia on R side of neck and R shoulder as light touch was causing significant pain to pt. Pt's presentation inconsistent with observed ROM and pain with palpation. Pt had increased pain following evaluation and required moist heat to help decrease pain. Pt needs continued skilled PT intervention to decrease pain and maximize overall function.   Rehab Potential Fair   Clinical Impairments Affecting Rehab Potential recurrence of injury; co-morbidities; hyperalgesia   PT Frequency 2x / week   PT Duration 6 weeks   PT Treatment/Interventions ADLs/Self Care Home Management;Electrical Stimulation;Moist Heat;Functional mobility training;Therapeutic activities;Therapeutic exercise;Neuromuscular re-education;Patient/family education;Manual techniques;Passive range of motion;Dry needling;Taping   PT Next Visit Plan manual, PROM, AAROM   PT Home Exercise Plan supine AAROM R shoulder flexion    Consulted and Agree with Plan of Care Patient      Patient will benefit from skilled therapeutic intervention in order to improve the following deficits and impairments:  Decreased activity tolerance, Decreased range of motion, Decreased strength, Hypomobility, Increased muscle spasms, Impaired sensation, Impaired UE functional use, Improper body mechanics, Postural dysfunction, Pain  Visit Diagnosis: Cervicalgia  Chronic right shoulder pain     Problem List Patient Active Problem List   Diagnosis Date Noted  . DDD (degenerative disc disease), cervical 07/29/2015  . Cervical disc disorder with radiculopathy of  cervical region 07/29/2015  . Cervical facet syndrome 07/29/2015  . DDD (degenerative disc disease), lumbar 07/29/2015  . Facet syndrome, lumbar 07/29/2015  . Bilateral occipital neuralgia 07/29/2015  . Sacroiliac joint dysfunction 07/29/2015  . DJD of shoulder 07/29/2015  . Acute hemorrhoid 03/11/2015  . CLL (chronic lymphocytic leukemia) ([place]) 01/15/2015   Geraldine Solar, SPT  Geraldine Solar 02/04/2016, 10:16 AM  [place] [facility] [address], [place], [address] Phone: [phone]   Fax:  [phone]  Name: Josette Shimabukuro MRN: [id number] Date of Birth: 04/28/1965

## 2016-02-05 ENCOUNTER — Encounter: Payer: Self-pay | Admitting: Physical Therapy

## 2016-02-05 ENCOUNTER — Ambulatory Visit: Payer: Medicare Other | Admitting: Physical Therapy

## 2016-02-05 DIAGNOSIS — M542 Cervicalgia: Secondary | ICD-10-CM | POA: Diagnosis not present

## 2016-02-05 DIAGNOSIS — G8929 Other chronic pain: Secondary | ICD-10-CM

## 2016-02-05 DIAGNOSIS — M25511 Pain in right shoulder: Secondary | ICD-10-CM

## 2016-02-05 NOTE — Therapy (Signed)
[place] [facility] [address] [place], [address] Phone: [phone]   Fax:  [phone]  Physical Therapy Treatment  Patient Details  Name: Tonya Myers MRN: [id number] Date of Birth: 12/12/1965 Referring Provider: Charlestine Massed, NP  Encounter Date: 02/05/2016      PT End of Session - 02/05/16 0944    Visit Number 2   Number of Visits 13   Date for PT Re-Evaluation 03/02/16   PT Start Time 0940   PT Stop Time 1025   PT Time Calculation (min) 45 min   Activity Tolerance Patient limited by pain   Behavior During Therapy [facility] for tasks assessed/performed      Past Medical History:  Diagnosis Date  . Acute hemorrhoid 03/11/2015  . Anxiety   . Arthritis   . Asthma   . Cancer ([place])   . Chronic back pain   . CLL (chronic lymphocytic leukemia) ([place])   . Depression   . Diabetes mellitus without complication ([place])   . Hypertension   . Hypothyroidism   . Vertigo     Past Surgical History:  Procedure Laterality Date  . ABDOMINAL HYSTERECTOMY    . COLONOSCOPY WITH PROPOFOL N/A 06/16/2015   Procedure: COLONOSCOPY WITH PROPOFOL;  Surgeon: [name], MD;  Location: [facility] ENDOSCOPY;  Service: Endoscopy;  Laterality: N/A;    There were no vitals filed for this visit.      Subjective Assessment - 02/05/16 0941    Subjective Pt reports she feels better this morning with less pain as she states she took medicine. She states she tried her HEP which hurt while she was doing it but did not exacerbate her pain afterwards.   Pertinent History Pt reports having neck and RUE pain which has been going on for 5 weeks. She reports she was exercising on a bike and she "heard a snap" in her R shoulder and has had neck and shoulder pain ever since. She states she has pain down her entire RUE with tingling in thumb and index finger. 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 Her sleep has been affected by this, only sleeping about 2 hours/night due to the pain. She denies any medications that have helped her pain. She states that he pain is worse as the day goes on. She reports heat did help her pain and helps her fall asleep. She states she can pinpoint the pain in her neck and shoulder. She states any kind of movement, writing, resting arm down by her side, lifting garbage, cooking etc. all aggravate her pain. 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 She enjoys fishing and cooking and wants to get back to that. She reports that the pain is a constant 10 and it is never better than that. Pt is right handed and she reports that her cancer is "under control."   Limitations Lifting;Writing;House hold activities   Patient Stated Goals get back to fishing and cooking    Currently in Pain? Yes   Pain Score --  moderate in neck and shoulder   Pain Onset More than a month ago   Pain Onset More than a month ago     MANUAL: Very gentle soft tissue  mobilization to R neck and R shoulder region x 23 mins total  R shoulder flexion PROM x 10 mins total; able to get pt further into shoulder flexion with decreased pain   PROM cervical rotation and lateral flexion in pain-free ROM x 7 mins  Repeated cervical retraction added to HEP, pt performed performed 2 reps and had slight increased pain with retraction so SPT educated to perform through pain-free ROM  Cervical AROM 52 deg R rotation 62 deg L rotation      PT Education - 02/05/16 1124    Education provided Yes   Education Details HEP, try to walk more as walking releases endorphins and can help decrease pain   Person(s) Educated Patient   Methods Explanation   Comprehension Verbalized understanding             PT Long Term Goals - 02/25/16  1012      PT LONG TERM GOAL #1   Title Pt will have improved QuickDASH score by >10% to demonstrate decreased pain and improved overall function.   Time 6   Period Weeks   Status New     PT LONG TERM GOAL #2   Title Pt will demonstrate improved R shoulder AROM to 120 deg flexion and abduction with 2/10 pain or less to improve function at home and demonstrate improved overall function.   Baseline 73 deg flexion, 49 deg abd   Time 6   Period Weeks   Status New     PT LONG TERM GOAL #3   Title Pt will be independent with HEP to promote recovery and decrease risk of reinjury.   Time 6   Period Weeks   Status New               Plan - 02/05/16 1123    Clinical Impression Statement Pt tolerated gentle soft tissue mobilization to R neck and R UT this date. She reported slight increased pain during but following she had decreased pain and had improved cervical rotation. Pt given repeated retractions as part of HEP and educated on benefits of walking program. Pt needs continued skilled PT intervention to maximize overall funtion.   Rehab Potential Fair   Clinical Impairments Affecting Rehab Potential recurrence of injury; co-morbidities; hyperalgesia   PT Frequency 2x / week   PT Duration 6 weeks   PT Treatment/Interventions ADLs/Self Care Home Management;Electrical Stimulation;Moist Heat;Functional mobility training;Therapeutic activities;Therapeutic exercise;Neuromuscular re-education;Patient/family education;Manual techniques;Passive range of motion;Dry needling;Taping   PT Next Visit Plan manual, PROM, AAROM   PT Home Exercise Plan supine AAROM R shoulder flexion    Consulted and Agree with Plan of Care Patient      Patient will benefit from skilled therapeutic intervention in order to improve the following deficits and impairments:  Decreased activity tolerance, Decreased range of motion, Decreased strength, Hypomobility, Increased muscle spasms, Impaired sensation, Impaired UE  functional use, Improper body mechanics, Postural dysfunction, Pain  Visit Diagnosis: Cervicalgia  Chronic right shoulder pain       G-Codes - 02/25/16 1121    Functional Assessment Tool Used Quick Dash, AROM, Grip Strength    Functional Limitation Carrying, moving and handling objects   Carrying, Moving and Handling Objects Current Status (V2536) At least 60 percent but less than 80 percent impaired, limited or restricted   Carrying, Moving and Handling Objects Goal Status (U4403) At least 20 percent but less than 40 percent impaired, limited or restricted      Problem List Patient Active Problem List  Diagnosis Date Noted  . DDD (degenerative disc disease), cervical 07/29/2015  . Cervical disc disorder with radiculopathy of cervical region 07/29/2015  . Cervical facet syndrome 07/29/2015  . DDD (degenerative disc disease), lumbar 07/29/2015  . Facet syndrome, lumbar 07/29/2015  . Bilateral occipital neuralgia 07/29/2015  . Sacroiliac joint dysfunction 07/29/2015  . DJD of shoulder 07/29/2015  . Acute hemorrhoid 03/11/2015  . CLL (chronic lymphocytic leukemia) ([place]) 01/15/2015   Geraldine Solar, SPT  Geraldine Solar 02/05/2016, 11:25 AM  [place] [facility] [address] [place], [address] Phone: [phone]   Fax:  [phone]  Name: Sequoyah Ramone MRN: [id number] Date of Birth: Jul 18, 1965

## 2016-02-10 ENCOUNTER — Ambulatory Visit: Payer: Medicare Other | Admitting: Physical Therapy

## 2016-02-17 ENCOUNTER — Encounter: Payer: Medicare Other | Admitting: Physical Therapy

## 2016-02-25 ENCOUNTER — Ambulatory Visit: Payer: Medicare Other | Attending: Adult Health | Admitting: Physical Therapy

## 2016-03-01 ENCOUNTER — Telehealth: Payer: Self-pay | Admitting: Physical Therapy

## 2016-03-01 ENCOUNTER — Ambulatory Visit: Payer: Medicare Other | Admitting: Physical Therapy

## 2016-03-01 NOTE — Telephone Encounter (Signed)
Lead PT called patient after not seeing her for morning appointment. Patient has been out of town and called to reschedule appointments initially upon leaving town, reports she did not realize she had more scheduled. She states she will call back next week when she returns home to [facility] for follow up appointments.   Kerman Passey, PT, DPT

## 2016-03-04 ENCOUNTER — Encounter: Payer: Medicare Other | Admitting: Physical Therapy

## 2016-03-08 ENCOUNTER — Encounter: Payer: Medicare Other | Admitting: Physical Therapy

## 2016-03-11 ENCOUNTER — Ambulatory Visit: Payer: Medicare Other | Admitting: Physical Therapy

## 2016-03-15 ENCOUNTER — Ambulatory Visit: Payer: Medicare Other | Admitting: Physical Therapy

## 2016-03-17 ENCOUNTER — Encounter: Payer: Medicare Other | Admitting: Physical Therapy

## 2016-03-23 DIAGNOSIS — R200 Anesthesia of skin: Secondary | ICD-10-CM | POA: Insufficient documentation

## 2016-03-23 DIAGNOSIS — R202 Paresthesia of skin: Secondary | ICD-10-CM | POA: Insufficient documentation

## 2016-03-23 DIAGNOSIS — M25511 Pain in right shoulder: Secondary | ICD-10-CM | POA: Insufficient documentation

## 2016-03-23 DIAGNOSIS — G8929 Other chronic pain: Secondary | ICD-10-CM | POA: Insufficient documentation

## 2016-03-24 ENCOUNTER — Other Ambulatory Visit: Payer: Self-pay | Admitting: Obstetrics and Gynecology

## 2016-03-24 DIAGNOSIS — N63 Unspecified lump in unspecified breast: Secondary | ICD-10-CM

## 2016-03-24 DIAGNOSIS — N644 Mastodynia: Secondary | ICD-10-CM

## 2016-03-25 ENCOUNTER — Inpatient Hospital Stay: Payer: Medicare Other | Attending: Oncology

## 2016-03-25 DIAGNOSIS — C9110 Chronic lymphocytic leukemia of B-cell type not having achieved remission: Secondary | ICD-10-CM | POA: Insufficient documentation

## 2016-03-25 LAB — CBC WITH DIFFERENTIAL/PLATELET
BASOS PCT: 1 %
Basophils Absolute: 0.1 10*3/uL (ref 0–0.1)
EOS ABS: 0.1 10*3/uL (ref 0–0.7)
Eosinophils Relative: 2 %
HCT: 38.5 % (ref 35.0–47.0)
HEMOGLOBIN: 12.7 g/dL (ref 12.0–16.0)
Lymphocytes Relative: 38 %
Lymphs Abs: 2.7 10*3/uL (ref 1.0–3.6)
MCH: 26.7 pg (ref 26.0–34.0)
MCHC: 33.1 g/dL (ref 32.0–36.0)
MCV: 80.6 fL (ref 80.0–100.0)
Monocytes Absolute: 0.4 10*3/uL (ref 0.2–0.9)
Monocytes Relative: 6 %
NEUTROS PCT: 53 %
Neutro Abs: 3.8 10*3/uL (ref 1.4–6.5)
Platelets: 325 10*3/uL (ref 150–440)
RBC: 4.77 MIL/uL (ref 3.80–5.20)
RDW: 14.7 % — ABNORMAL HIGH (ref 11.5–14.5)
WBC: 7.1 10*3/uL (ref 3.6–11.0)

## 2016-03-29 ENCOUNTER — Other Ambulatory Visit: Payer: Self-pay | Admitting: Oncology

## 2016-03-29 ENCOUNTER — Telehealth: Payer: Self-pay | Admitting: *Deleted

## 2016-03-29 DIAGNOSIS — C9110 Chronic lymphocytic leukemia of B-cell type not having achieved remission: Secondary | ICD-10-CM

## 2016-03-29 NOTE — Telephone Encounter (Signed)
Patient is in agreement with having CT Scan. Please enter orders

## 2016-03-29 NOTE — Telephone Encounter (Signed)
Unlikely related to underlying CLL. Given normal lab work, if patient wishes to repeat imaging with CT scans we can schedule that in the next several weeks. Previously, patient did not wish to undergo imaging unless absolutely necessary.

## 2016-03-29 NOTE — Telephone Encounter (Signed)
-----   Message from [name] sent at 03/29/2016 10:08 AM EST ----- Regarding: Weakness Contact: [phone] Patient states that she is concerned of ongoing weakness and feeling faint. Patient has several episodes yesterday while out shopping and at home in the shower. Patient was seen in our clinic on 11/30 for labs, per patient labs were normal. Patient would like for Dr. Grayland Ormond to be aware of these symptoms and to advise.  Thanks, [name]

## 2016-03-29 NOTE — Telephone Encounter (Signed)
CT orders placed

## 2016-04-08 ENCOUNTER — Ambulatory Visit
Admission: RE | Admit: 2016-04-08 | Discharge: 2016-04-08 | Disposition: A | Discharge status: Home / Self Care | Payer: Medicare Other | Source: Ambulatory Visit | Attending: Obstetrics and Gynecology | Admitting: Obstetrics and Gynecology

## 2016-04-08 DIAGNOSIS — N63 Unspecified lump in unspecified breast: Secondary | ICD-10-CM

## 2016-04-08 DIAGNOSIS — R921 Mammographic calcification found on diagnostic imaging of breast: Secondary | ICD-10-CM | POA: Insufficient documentation

## 2016-04-08 DIAGNOSIS — N644 Mastodynia: Secondary | ICD-10-CM | POA: Diagnosis present

## 2016-04-08 DIAGNOSIS — Z856 Personal history of leukemia: Secondary | ICD-10-CM | POA: Insufficient documentation

## 2016-04-08 IMAGING — MG MM DIGITAL DIAGNOSTIC BILAT W/ TOMO W/ CAD
8 of 18 series · 8 of 34 positions shown · non-contrast
Comparison: Previous exam(s).

CLINICAL DATA: 50-year-old female presenting for evaluation of a
palpable left breast mass which was identified about 2 or 3 weeks
ago. For the last few weeks the patient no longer feels the mass.Of
note the patient has been previously treated for CLL.

EXAM:
2D DIGITAL DIAGNOSTIC BILATERAL MAMMOGRAM WITH CAD AND ADJUNCT TOMO

[R CC (1 of 2)]
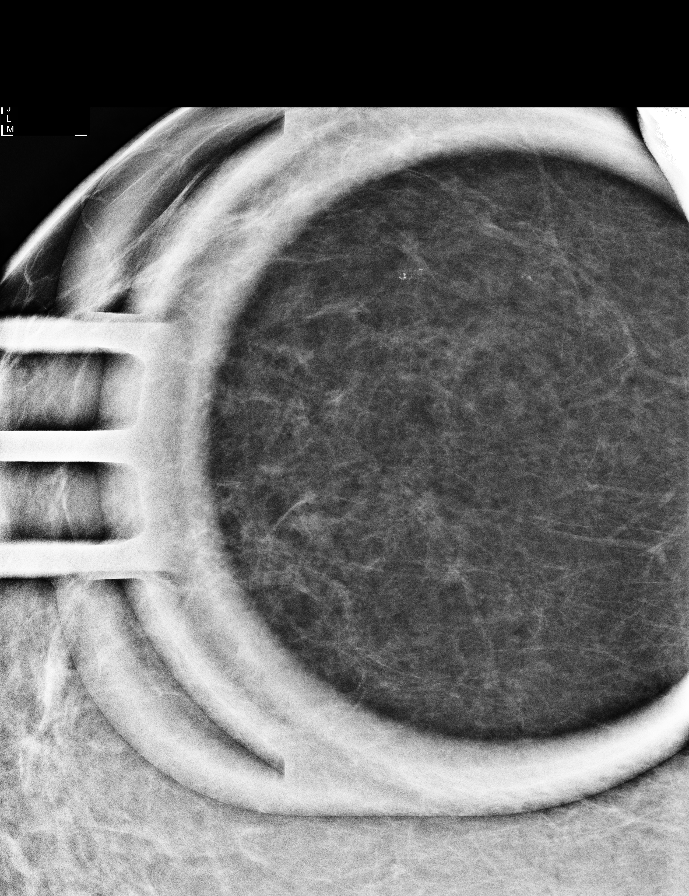

[R ML]
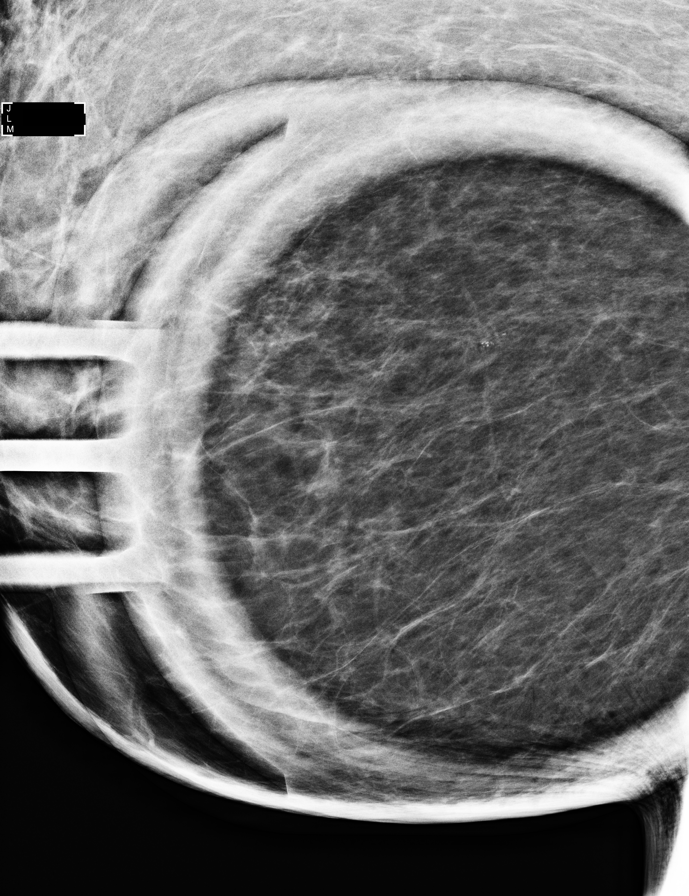

[L CC synth-2D]
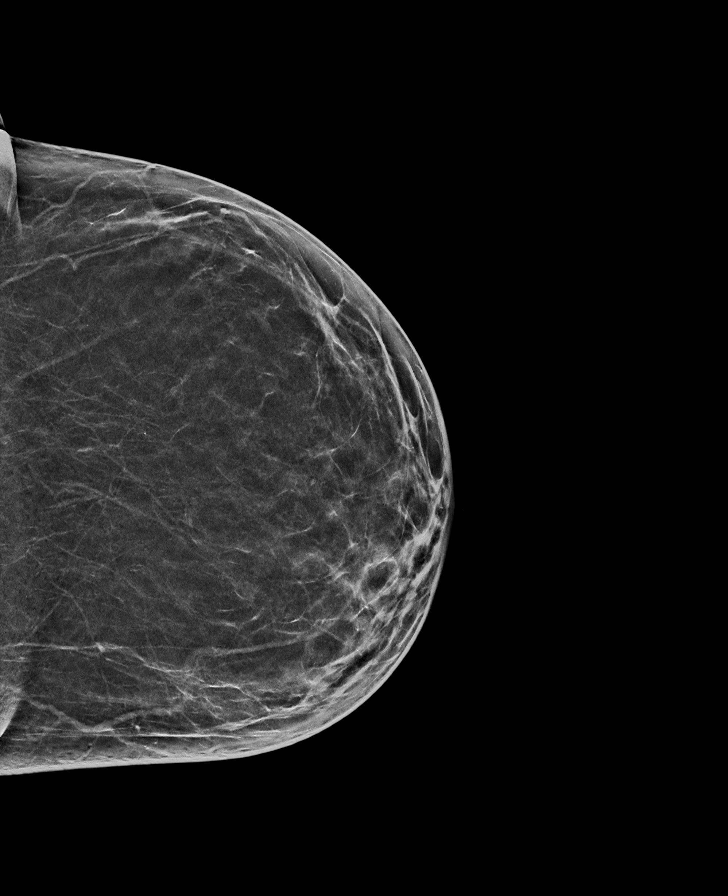

[R MLO]
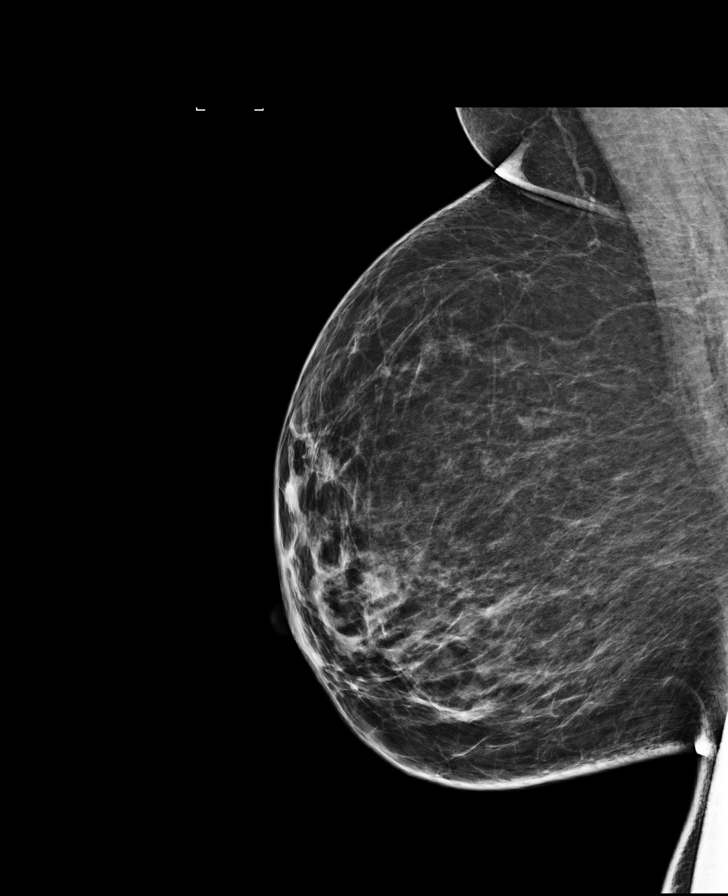

[L CC]
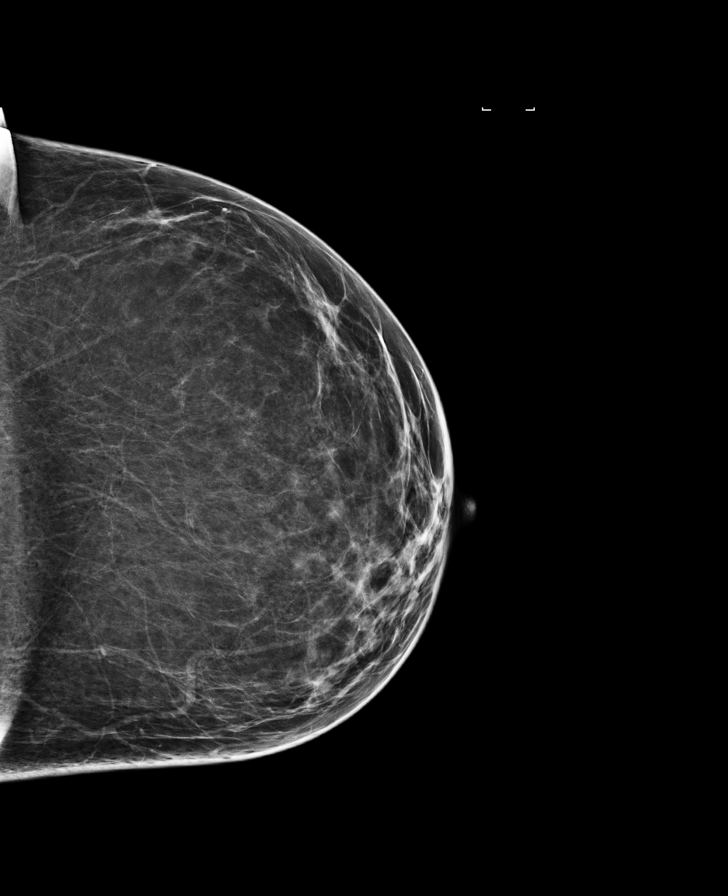

[L MLO synth-2D]
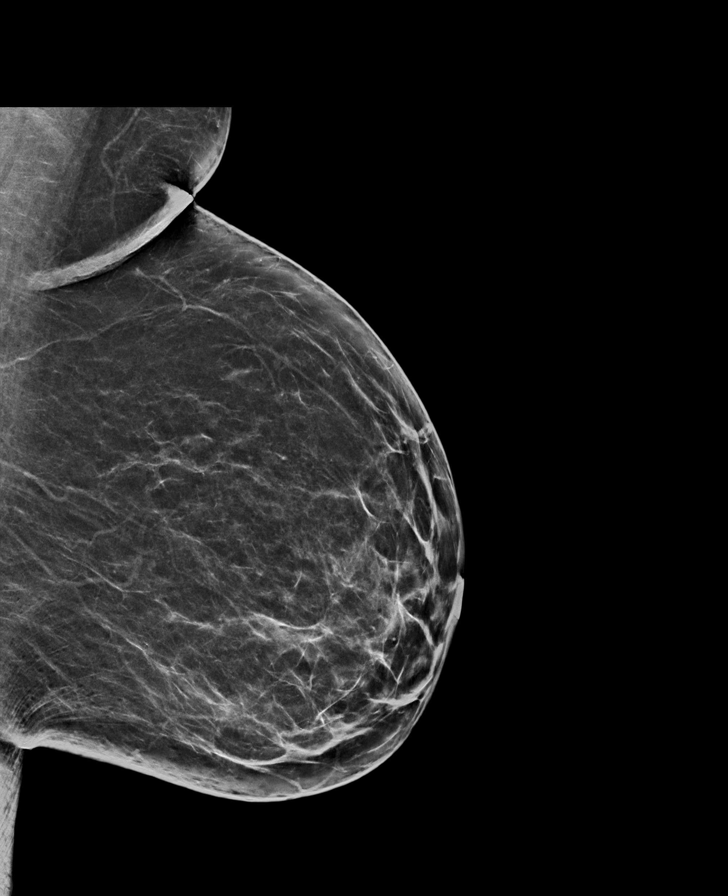

[R CC synth-2D]
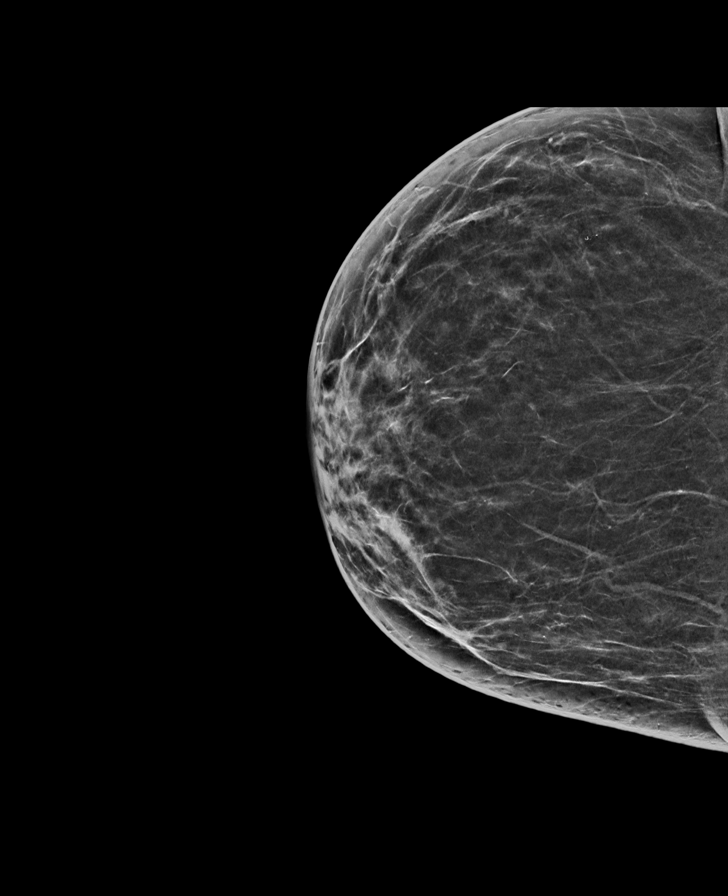

[R CC (2 of 2)]
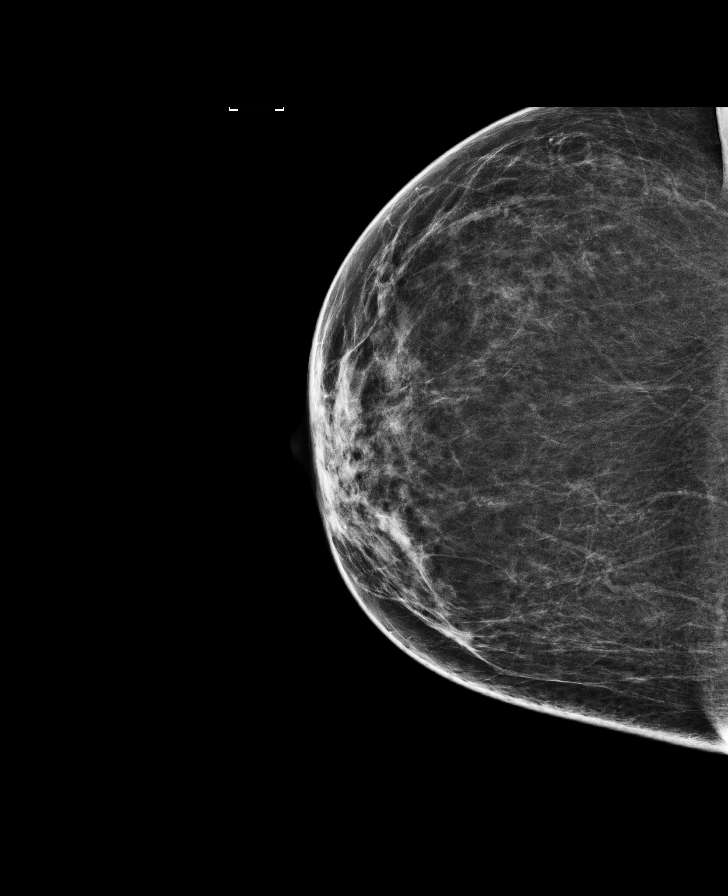

[8 of 34 positions shown; findings below may reference images not displayed]

ACR Breast Density Category b: There are scattered areas of
fibroglandular density.
FINDINGS: The markedly enlarged bilateral axillary lymph nodes seen on the
prior mammogram have resolved. There are no suspicious masses in the
left breast to account for the previously palpable mass of concern.
In the lateral aspect of the right breast, middle to posterior depth
there is a 4 mm group of pleomorphic calcifications. No other
suspicious calcifications, masses or areas of distortion are seen in
the bilateral breasts.

Mammographic images were processed with CAD.
IMPRESSION: 1. There is an indeterminate 4 mm group of calcifications in the
lateral right breast.

2. No mammographic abnormalities in the left breast to account for
the patient's previously palpable lump.

3. Resolution of the bilateral axillary lymphadenopathy consistent
with history of treated CLL.

RECOMMENDATION:
1. Stereotactic biopsy is recommended for the right breast
calcifications.

2. I did advise the patient that if she again identifies the
palpable site of concern, she may return for a targeted ultrasound.

I have discussed the findings and recommendations with the patient.
Results were also provided in writing at the conclusion of the
visit. If applicable, a reminder letter will be sent to the patient
regarding the next appointment.

BI-RADS CATEGORY  4: Suspicious.

## 2016-04-09 LAB — GLUCOSE, CAPILLARY: GLUCOSE-CAPILLARY: 206 mg/dL — AB (ref 65–99)

## 2016-04-12 ENCOUNTER — Other Ambulatory Visit: Payer: Self-pay | Admitting: Obstetrics and Gynecology

## 2016-04-12 DIAGNOSIS — R921 Mammographic calcification found on diagnostic imaging of breast: Secondary | ICD-10-CM

## 2016-04-13 ENCOUNTER — Ambulatory Visit
Admission: RE | Admit: 2016-04-13 | Discharge: 2016-04-13 | Disposition: A | Discharge status: Home / Self Care | Payer: Medicare Other | Source: Ambulatory Visit | Attending: Oncology | Admitting: Oncology

## 2016-04-13 ENCOUNTER — Other Ambulatory Visit: Payer: Medicare Other

## 2016-04-13 DIAGNOSIS — C9110 Chronic lymphocytic leukemia of B-cell type not having achieved remission: Secondary | ICD-10-CM

## 2016-04-13 DIAGNOSIS — R918 Other nonspecific abnormal finding of lung field: Secondary | ICD-10-CM | POA: Insufficient documentation

## 2016-04-13 IMAGING — CT CT NECK W/ CM
2 of 3 series · 7 of 14 positions shown, 8 images · IV contrast (iopamidol)
Comparison: [DATE]

CLINICAL DATA: Restaging CLL.

EXAM:
CT NECK WITH CONTRAST
TECHNIQUE: Multidetector CT imaging of the neck was performed using the
standard protocol following the bolus administration of intravenous
contrast.
CONTRAST:  125mL [X8] IOPAMIDOL ([X8]) INJECTION 61%

[Series 3: axial neck · axial · 0.62mm/px · z∈[-415,-295]mm · 3 of 121 slices shown]
[im 31/121  bone]
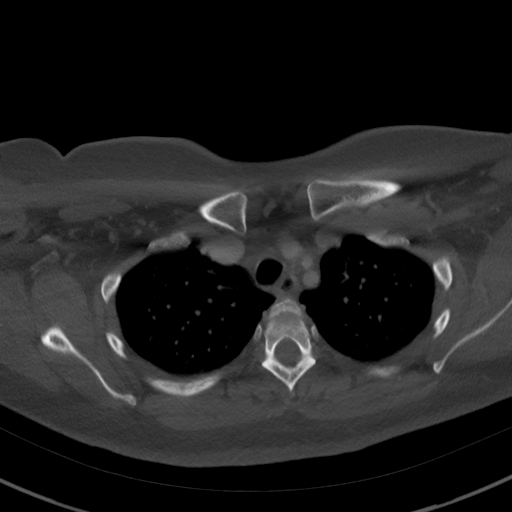
[im 61/121  bone]
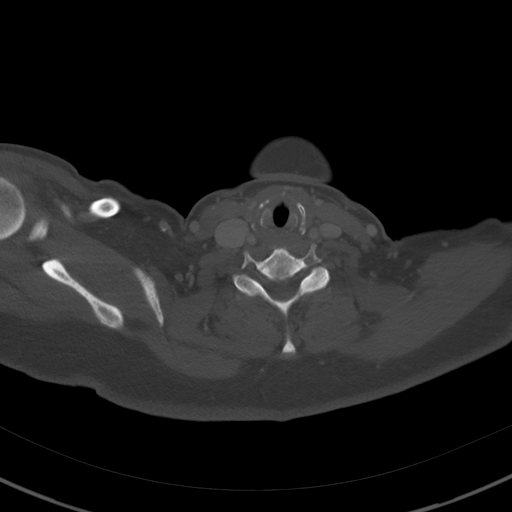
[im 91/121  bone]
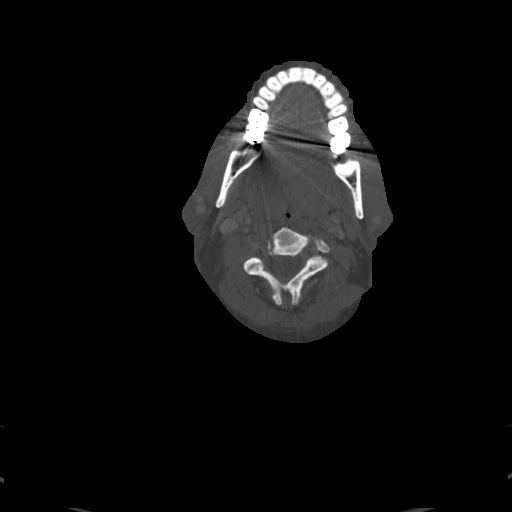

[Series 8: orthogonal ax · axial · 0.39mm/px · z∈[-429,-283]mm · 4 of 123 slices shown, 5 images]
[im 25/123  soft-tissue]
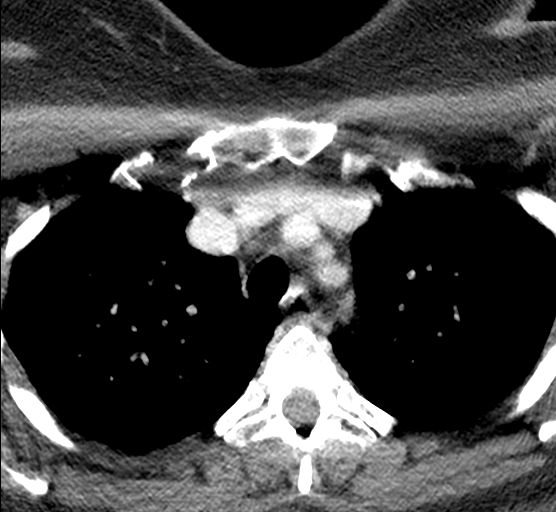
[im 25/123  bone]
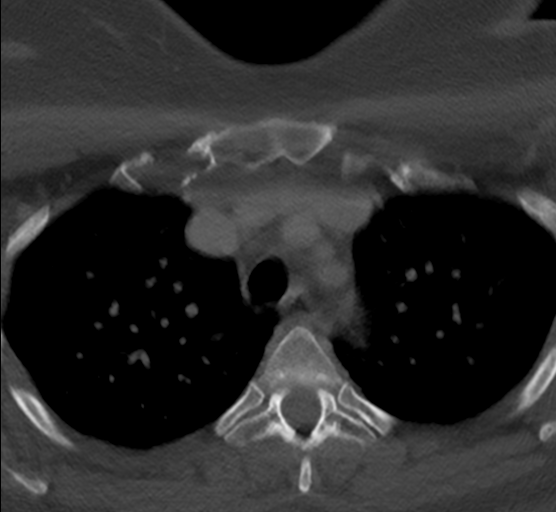
[im 49/123  bone]
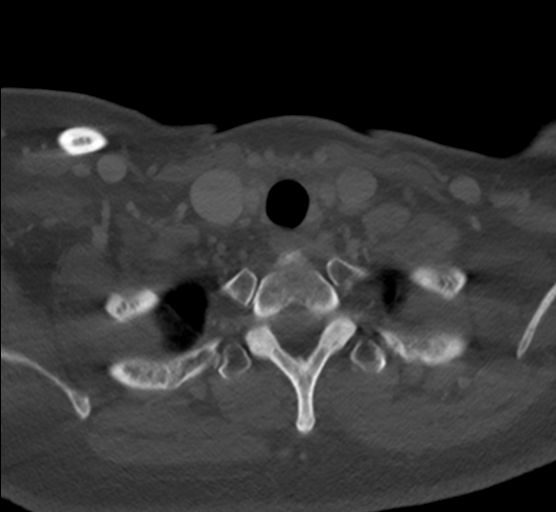
[im 74/123  bone]
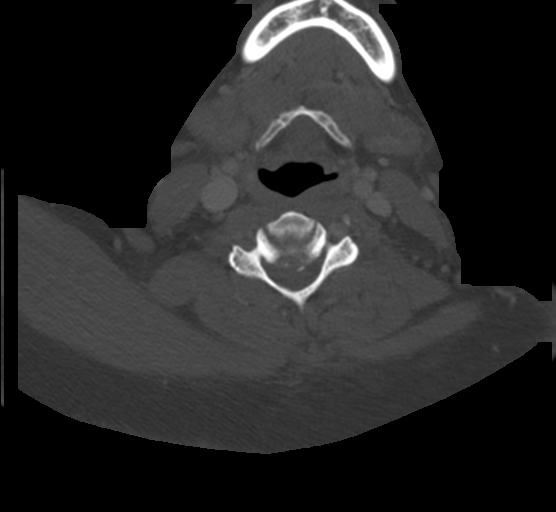
[im 98/123  bone]
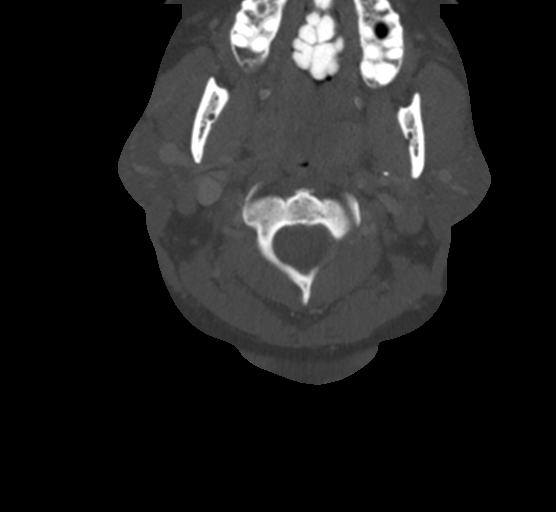

[7 of 14 positions shown; findings below may reference images not displayed]

FINDINGS: Pharynx and larynx: Waldeyer's ring is difficult to compare to prior
due to partial collapse of the pharynx. No definite masslike
thickening of the lymphatic tissue.

Salivary glands: No inflammation, mass, or stone.

Thyroid: Atrophic.

Lymph nodes: None enlarged or abnormal density.

Vascular: Negative.

Limited intracranial: Negative.

Visualized orbits: Negative.

Mastoids and visualized paranasal sinuses: Clear.

Skeleton: No acute or aggressive finding. Torus palatini and
maxillaris. Degenerative disc disease with diffuse bulging and
ridging. Mild to moderate canal stenosis from C2-3 to C5-6.

Upper chest: Reported separately.
IMPRESSION: 1. No recurrent lymphadenopathy in the neck.
2. Chest CT reported separately.

## 2016-04-13 IMAGING — CT CT ABD-PELV W/ CM
2 of 3 series · 16 of 30 positions shown, 19 images · IV contrast (iopamidol)
Comparison: [DATE]

CLINICAL DATA: Restaging of chronic lymphocytic leukemia.

EXAM:
CT CHEST, ABDOMEN, AND PELVIS WITH CONTRAST
TECHNIQUE: Multidetector CT imaging of the chest, abdomen and pelvis was
performed following the standard protocol during bolus
administration of intravenous contrast.
CONTRAST:  125mL [10] IOPAMIDOL ([10]) INJECTION 61%

[Series 3: cap with · axial · 0.76mm/px · z∈[-987,-422]mm · 11 of 139 slices shown, 14 images]
[im 13/139  mediastinal]
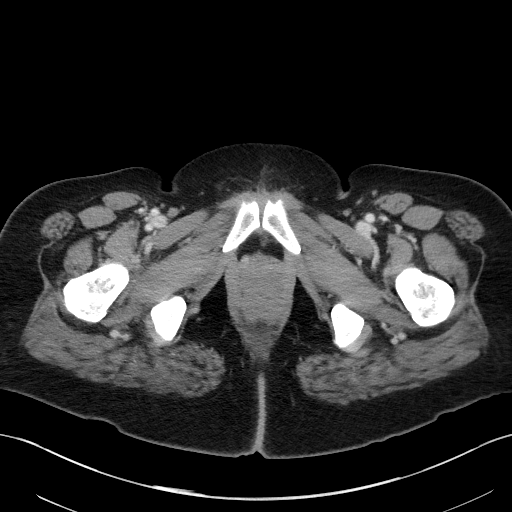
[im 13/139  lung]
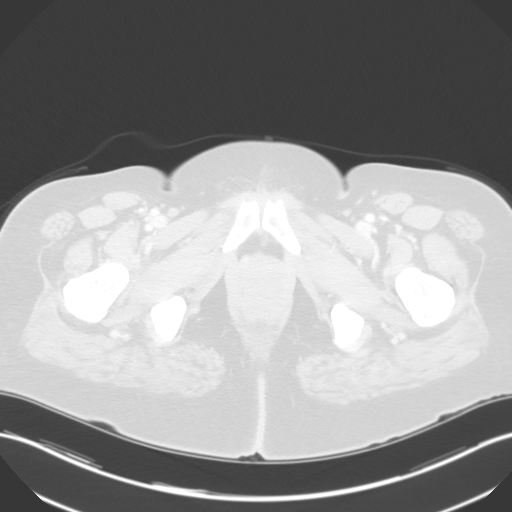
[im 26/139  lung]
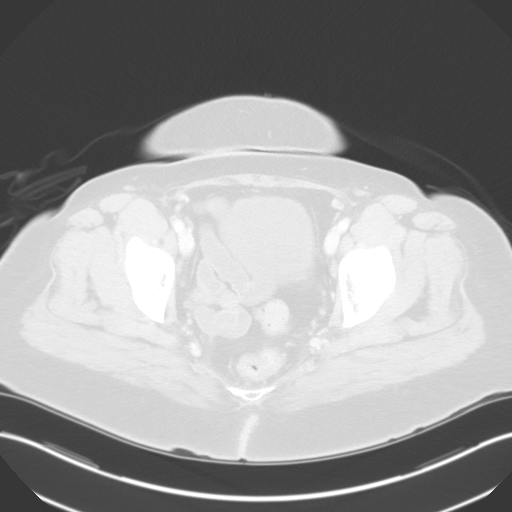
[im 38/139  lung]
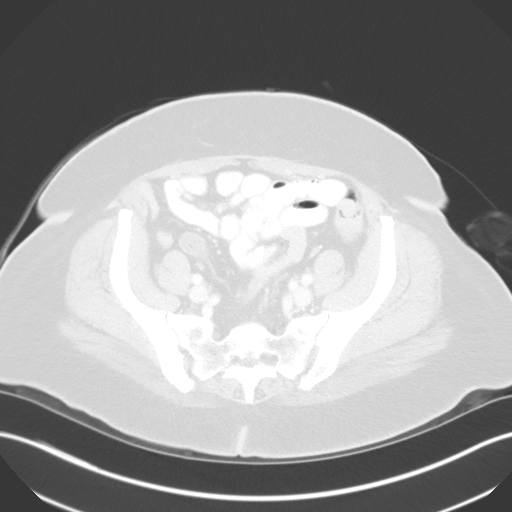
[im 51/139  lung]
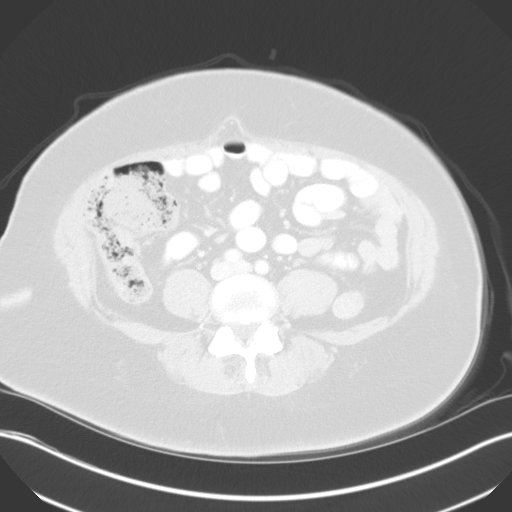
[im 63/139  mediastinal]
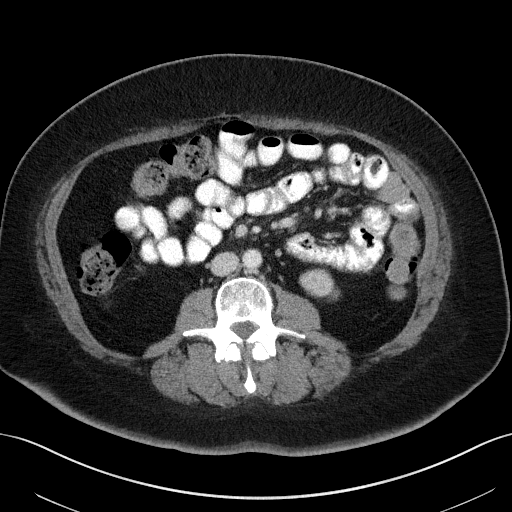
[im 63/139  lung]
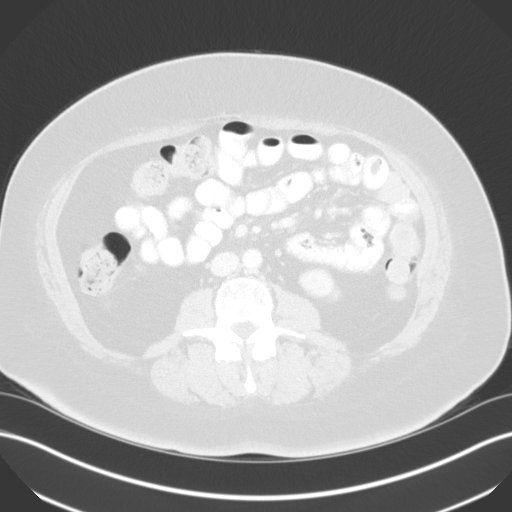
[im 68/139  lung]
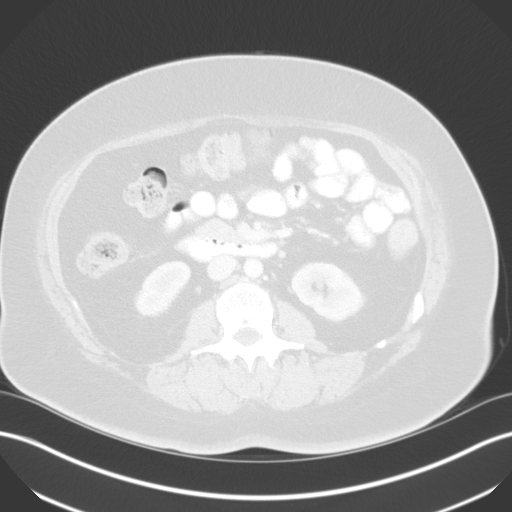
[im 76/139  lung]
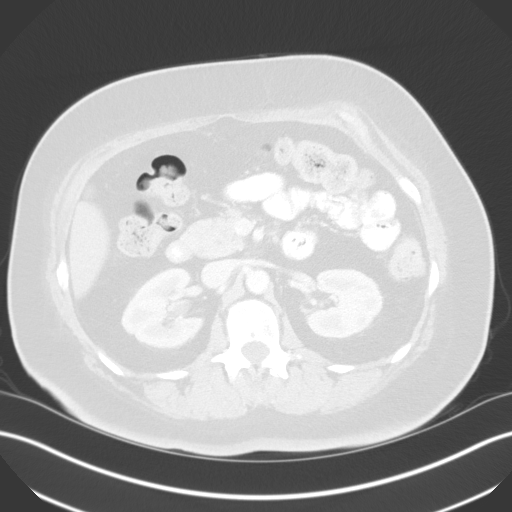
[im 88/139  lung]
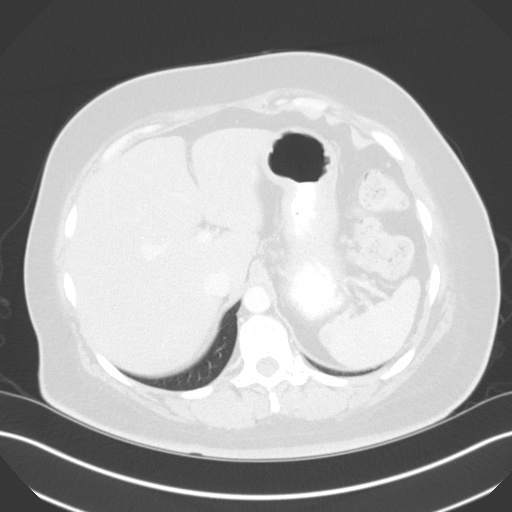
[im 101/139  mediastinal]
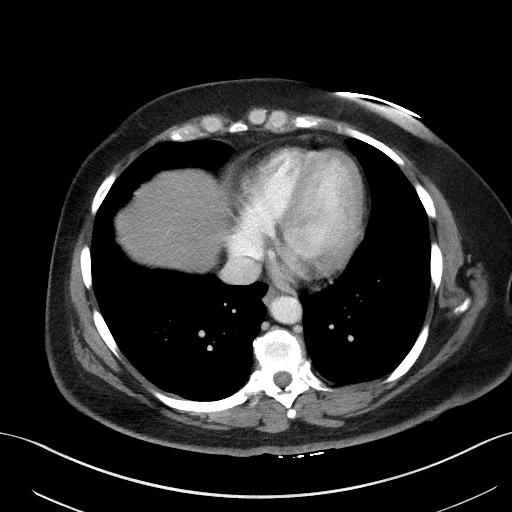
[im 101/139  lung]
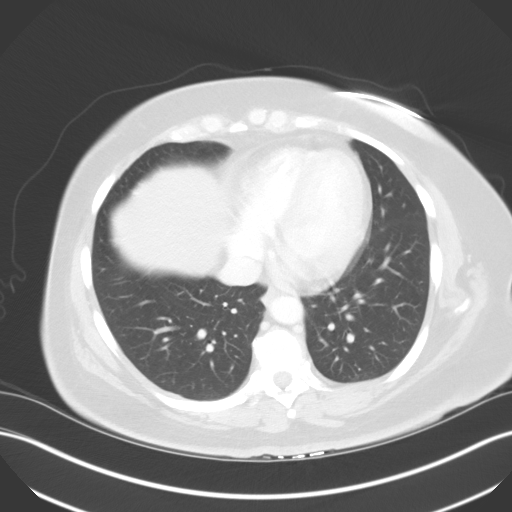
[im 113/139  lung]
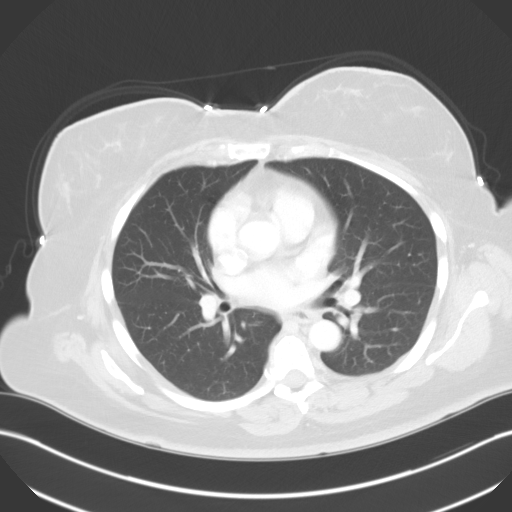
[im 126/139  lung]
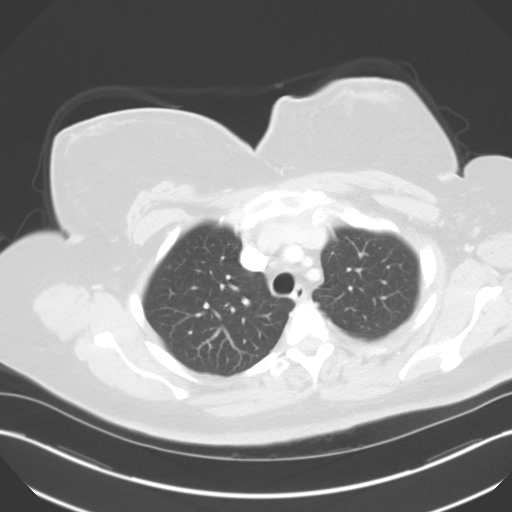

[Series 6: lung · axial · 0.76mm/px · z∈[-643,-487]mm · 5 of 156 slices shown]
[im 13/156  lung]
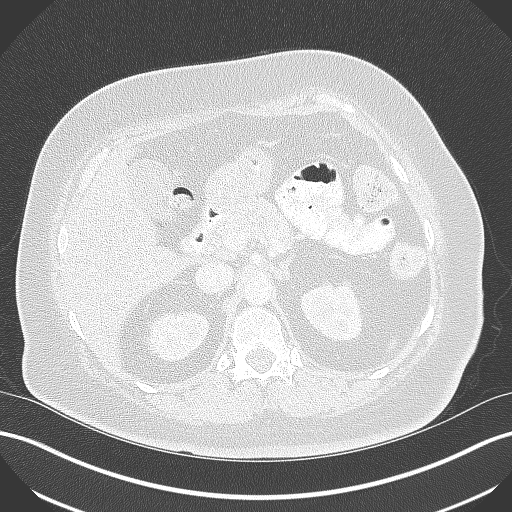
[im 39/156  lung]
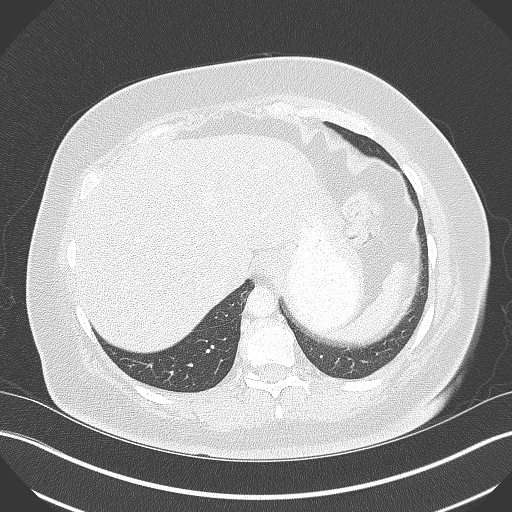
[im 52/156  lung]
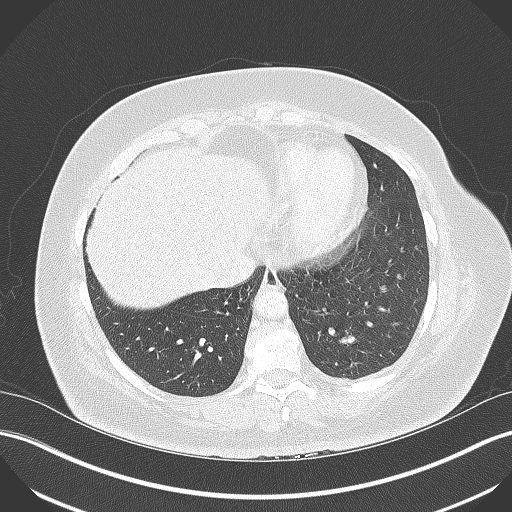
[im 65/156  lung]
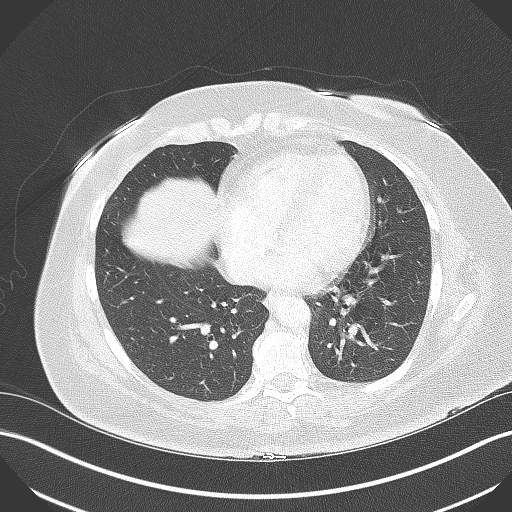
[im 91/156  lung]
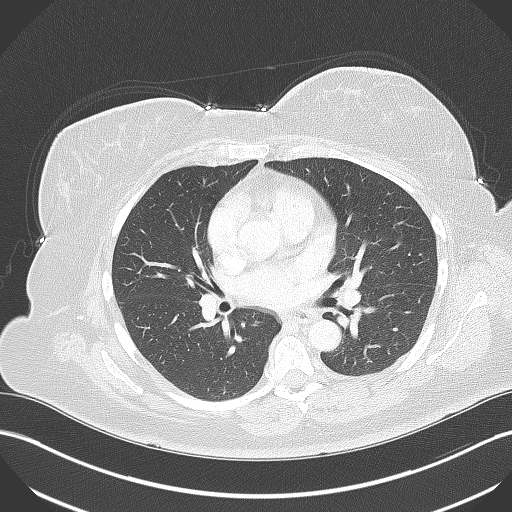

[16 of 30 positions shown; findings below may reference images not displayed]

FINDINGS: CT CHEST FINDINGS

Cardiovascular: Unremarkable

Mediastinum/Nodes: No recurrent thoracic adenopathy.

Lungs/Pleura: 2 by 3 mm right upper lobe nodule on image 35/6, and 7
by 5 mm right upper lobe sub solid nodule on image 45/6, not
appreciably changed.

Musculoskeletal: Unremarkable

CT ABDOMEN PELVIS FINDINGS

Hepatobiliary: Unremarkable

Pancreas: Unremarkable

Spleen: Unremarkable

Adrenals/Urinary Tract: Unremarkable

Stomach/Bowel: Unremarkable

Vascular/Lymphatic: No recurrent pathologic adenopathy.

Reproductive: Uterus absent.  Adnexa unremarkable.

Other: No supplemental non-categorized findings.

Musculoskeletal: Lumbar spondylosis and degenerative disc disease at
L5-S1 causing mild left foraminal stenosis due to facet spurring.

Small umbilical hernia contains adipose tissue.
IMPRESSION: 1. No recurrent adenopathy or active malignancy identified.
2. Small right upper lobe nodules are stable and include a 6 mm
average size sub solid nodule. This may merit surveillance.

## 2016-04-13 MED ORDER — IOPAMIDOL (ISOVUE-300) INJECTION 61%
125.0000 mL | Freq: Once | INTRAVENOUS | Status: AC | PRN
  Administered 2016-04-13: 125 mL via INTRAVENOUS

## 2016-04-23 ENCOUNTER — Other Ambulatory Visit: Payer: Self-pay | Admitting: *Deleted

## 2016-04-23 ENCOUNTER — Inpatient Hospital Stay
Admission: RE | Admit: 2016-04-23 | Discharge: 2016-04-23 | Disposition: A | Discharge status: Home / Self Care | Payer: Self-pay | Source: Ambulatory Visit | Attending: *Deleted | Admitting: *Deleted

## 2016-04-23 DIAGNOSIS — Z9289 Personal history of other medical treatment: Secondary | ICD-10-CM

## 2016-05-03 ENCOUNTER — Other Ambulatory Visit: Payer: Self-pay | Admitting: Nurse Practitioner

## 2016-05-17 ENCOUNTER — Ambulatory Visit
Admission: RE | Admit: 2016-05-17 | Discharge: 2016-05-17 | Disposition: A | Discharge status: Home / Self Care | Payer: Medicare Other | Source: Ambulatory Visit | Attending: Obstetrics and Gynecology | Admitting: Obstetrics and Gynecology

## 2016-05-17 DIAGNOSIS — R921 Mammographic calcification found on diagnostic imaging of breast: Secondary | ICD-10-CM

## 2016-05-17 DIAGNOSIS — N6021 Fibroadenosis of right breast: Secondary | ICD-10-CM | POA: Diagnosis not present

## 2016-05-17 HISTORY — PX: BREAST BIOPSY: SHX20

## 2016-05-17 IMAGING — MG MM BREAST LOCALIZATION CLIP
2 series · 2 of 2 positions shown · non-contrast
Comparison: Previous exam(s).

CLINICAL DATA: Post stereotactic core needle biopsy of right breast
lower outer quadrant calcifications.

EXAM:
DIAGNOSTIC RIGHT MAMMOGRAM POST STEREOTACTIC BIOPSY

[R CC]
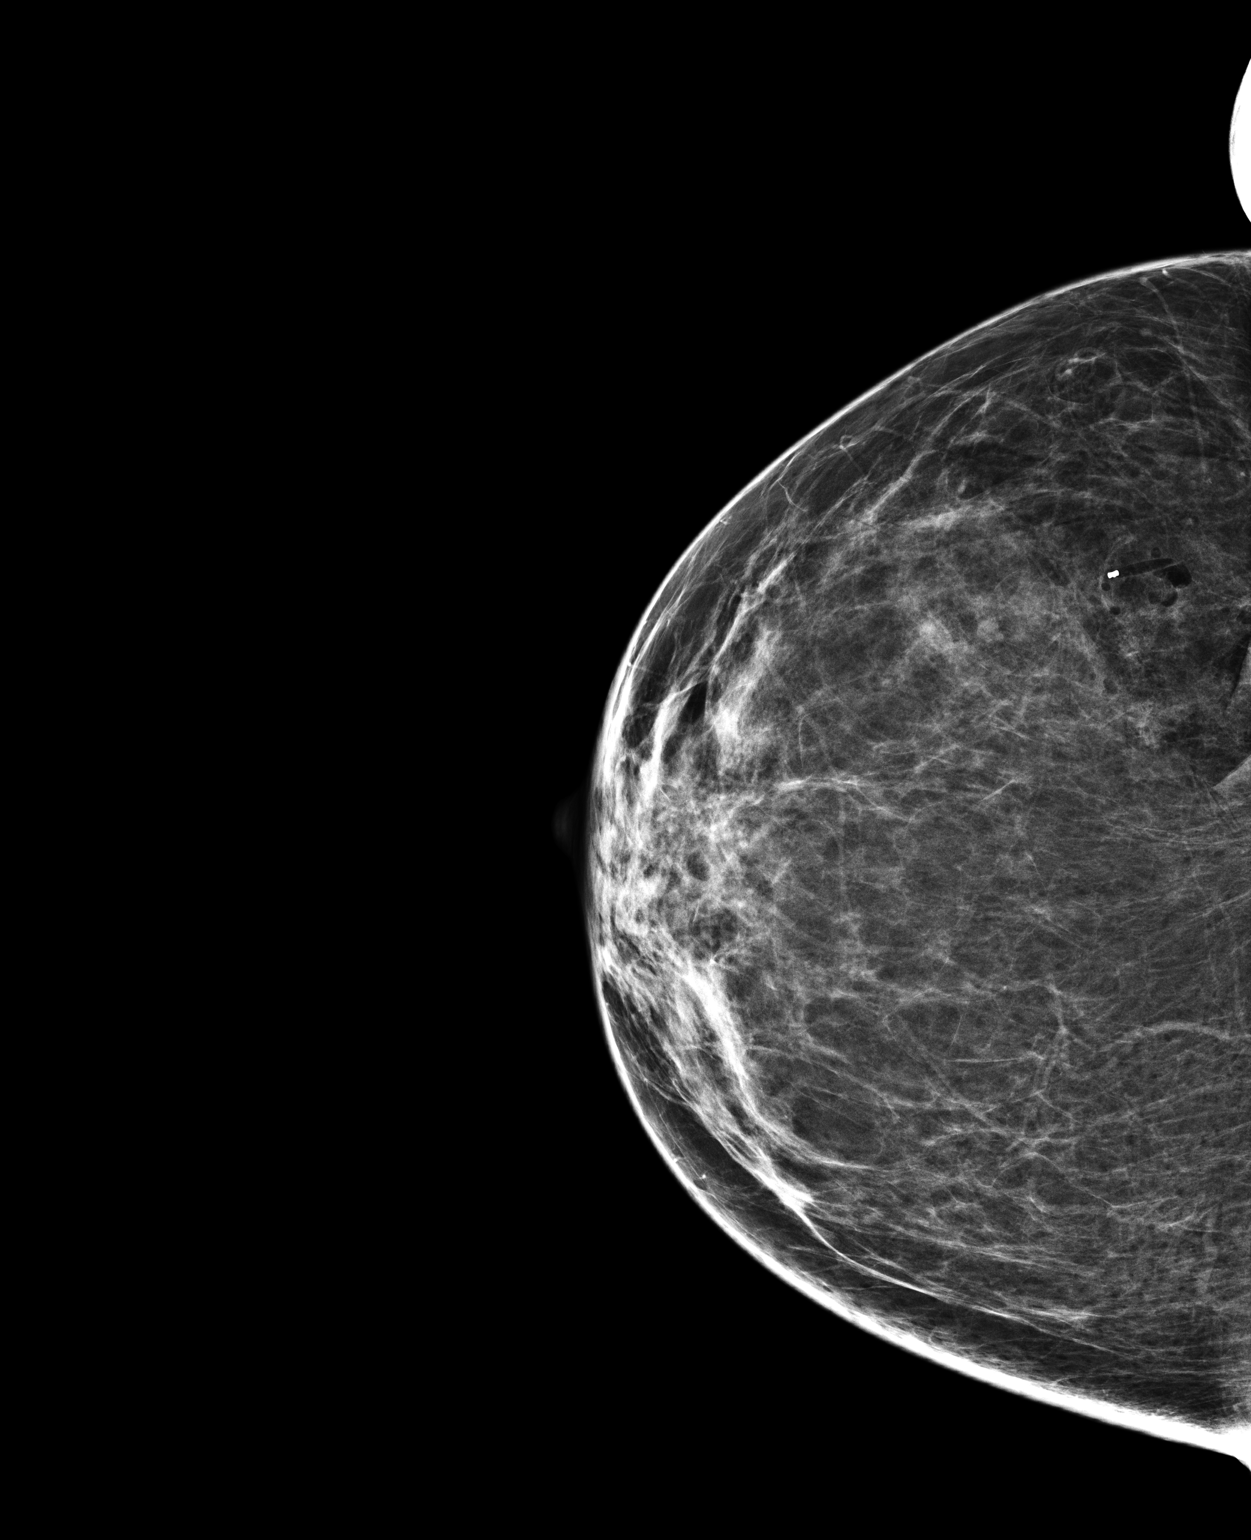

[R LM]
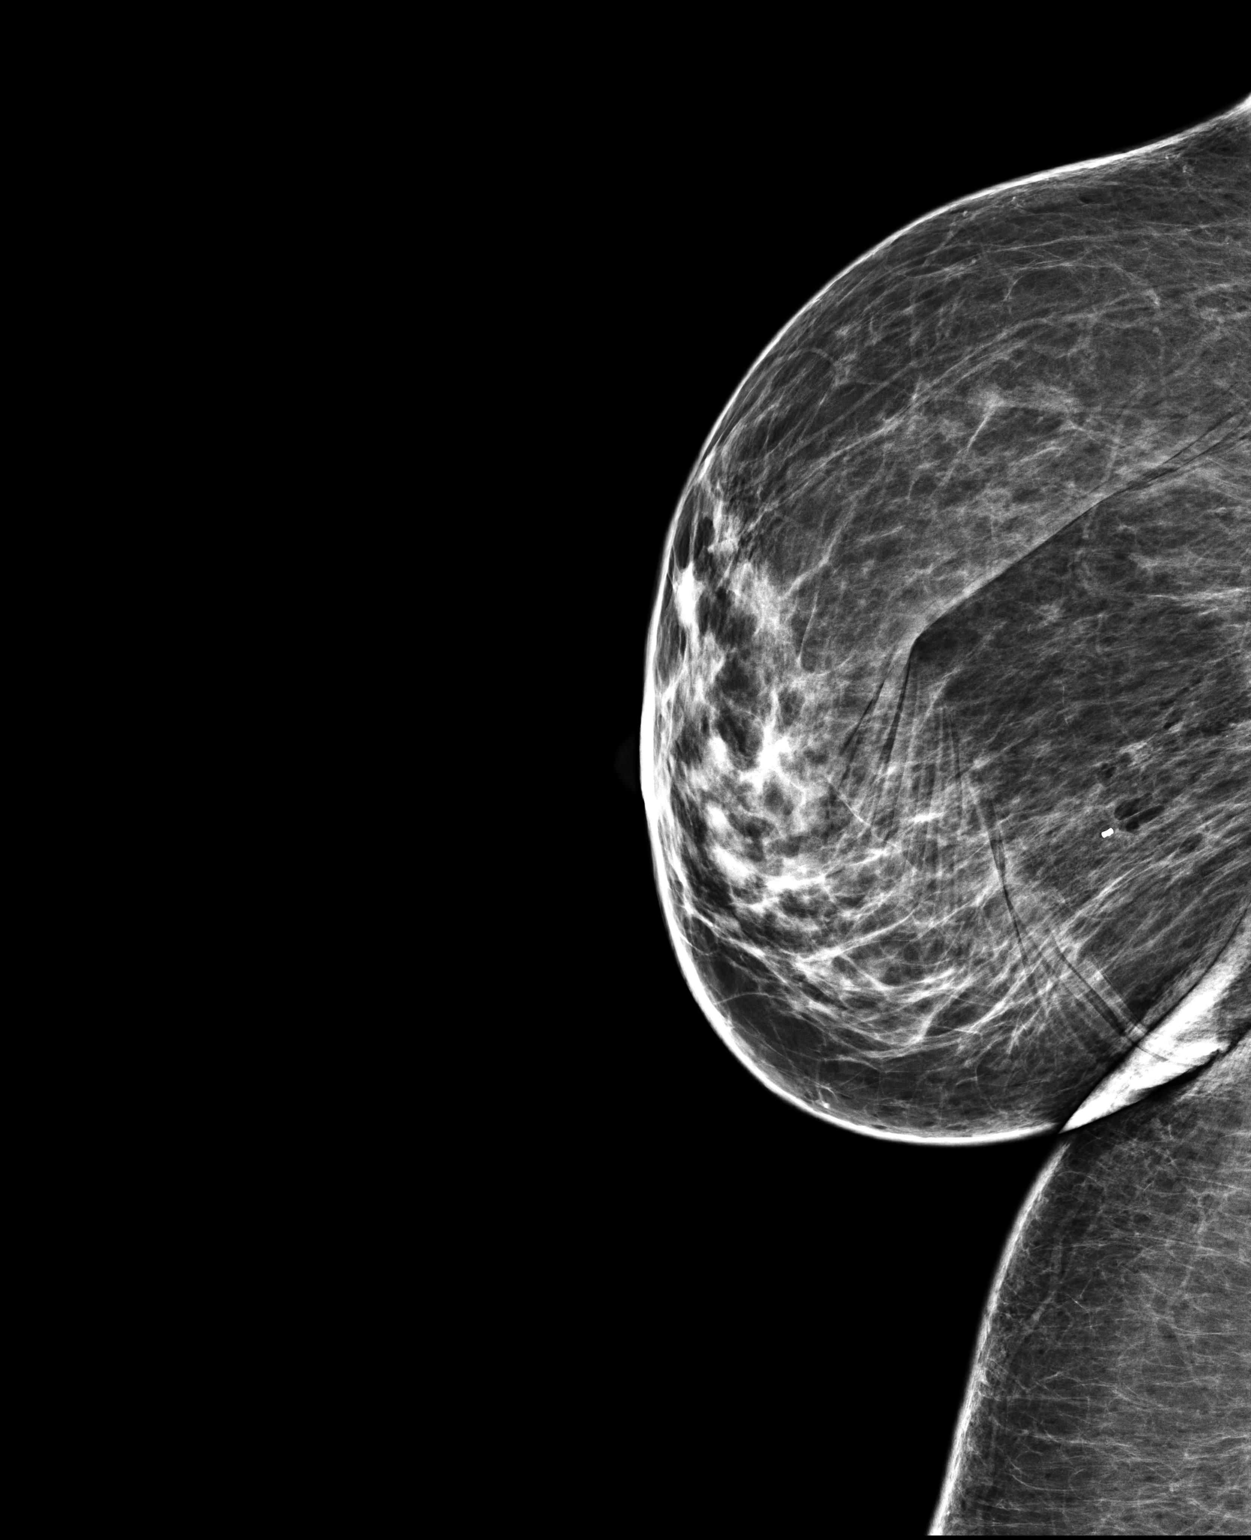

[2 of 2 positions shown; findings below may reference images not displayed]

FINDINGS: Mammographic images were obtained following stereotactic guided
biopsy of right breast lower outer quadrant calcifications. Two-view
mammography demonstrates presence of dumbbell-shaped tissue marker
within the biopsy site, in appropriate radiographic position.
Expected post biopsy changes are seen.
IMPRESSION: Successful placement of dumbbell-shaped tissue marker within the
right breast, post stereotactic core needle biopsy of lower outer
quadrant calcifications.

Final Assessment: Post Procedure Mammograms for Marker Placement

## 2016-05-17 IMAGING — MG MM BREAST BX W/ LOC DEV 1ST LESION IMAGE BX SPEC STEREO GUIDE*R*
8 of 10 series · 8 of 14 positions shown · non-contrast
Comparison: Previous exams.

ADDENDUM:
Pathology of the right breast biopsy revealed BREAST CALCIFICATIONS,
RIGHT LOWER OUTER QUADRANT; STEREOTACTIC

BIOPSY: FIBROADENOMATOUS CHANGE AND SCLEROSING ADENOSIS WITH
ASSOCIATED CALCIFICATIONS. NEGATIVE FOR ATYPIA AND MALIGNANCY. This
was found to be concordant by Dr. ADRAIN.
Recommendation:  Return to routine screening mammography.
At the patient's request, pathology and recommendations were relayed
to the patient by phone. She stated she has done well following the
biopsy with no bleeding, bruising, or hematoma. Post biopsy
instructions were reviewed with the patient. All of her questions
were answered. She was encouraged to call the [HOSPITAL]
with any further questions or concerns.
Results and recommendations relayed by ADRAIN on [DATE]
at [DATE].
CLINICAL DATA: Right breast slightly lower outer quadrant
calcifications.
EXAM:
RIGHT BREAST STEREOTACTIC CORE NEEDLE BIOPSY

[R LM (1 of 7)]
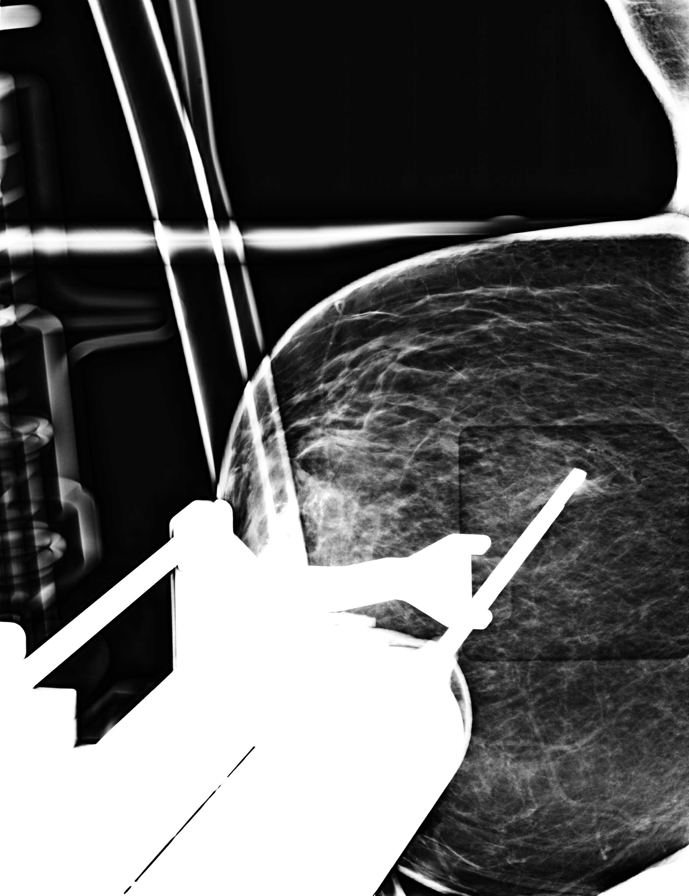

[R LM (2 of 7)]
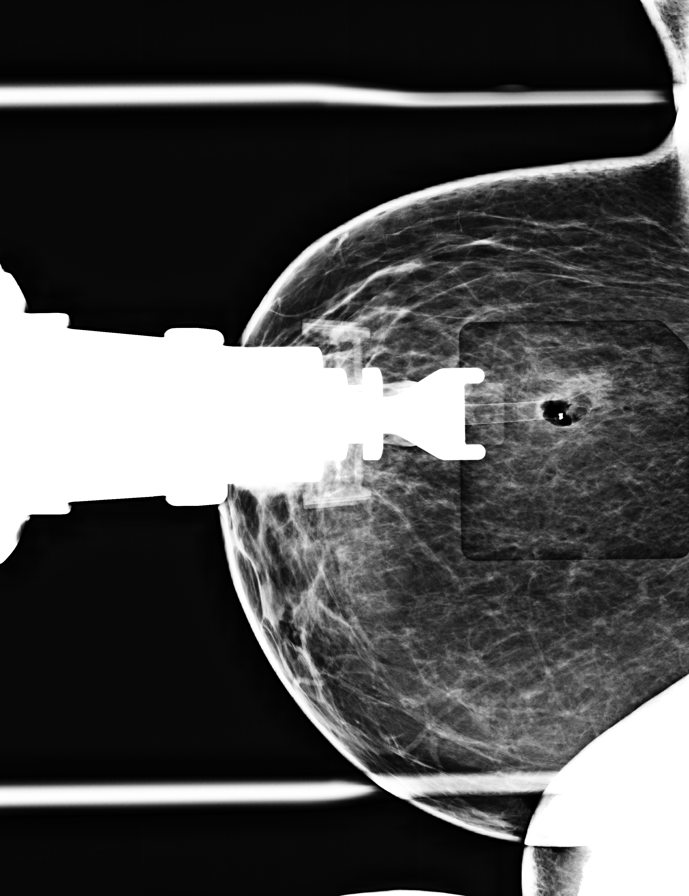

[R LM (3 of 7)]
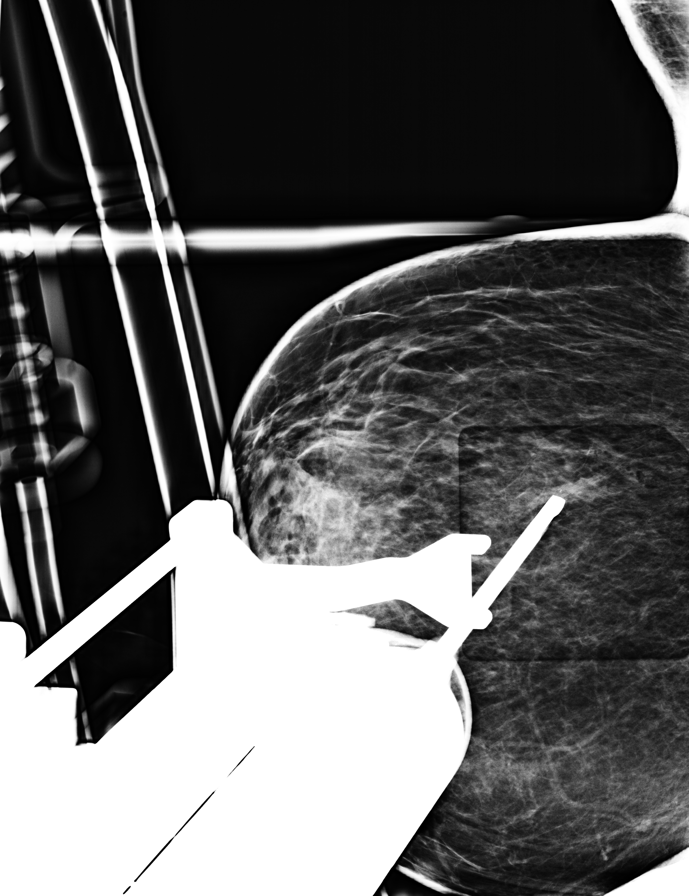

[R LM (4 of 7)]
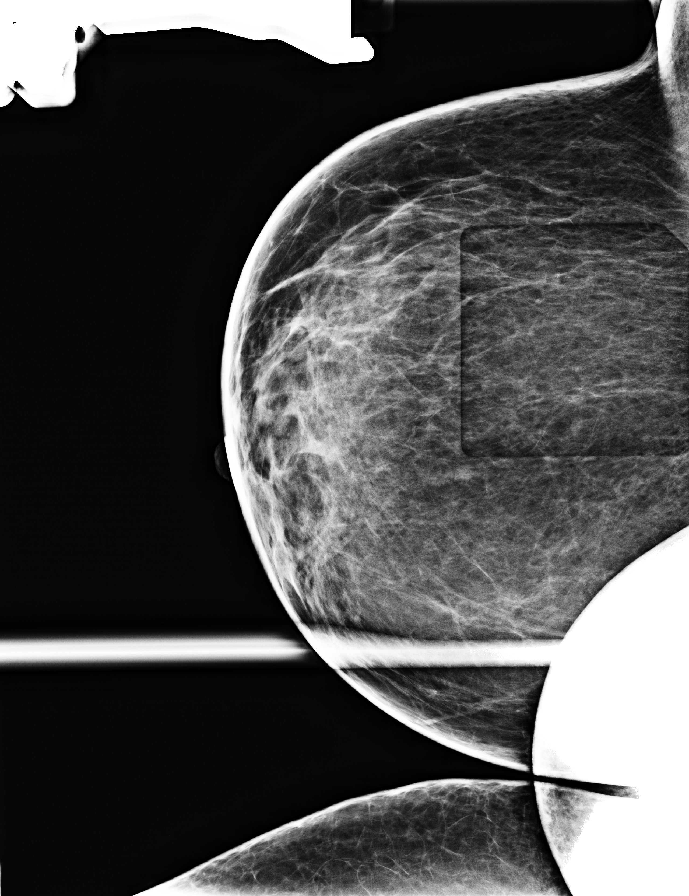

[R LM (5 of 7)]
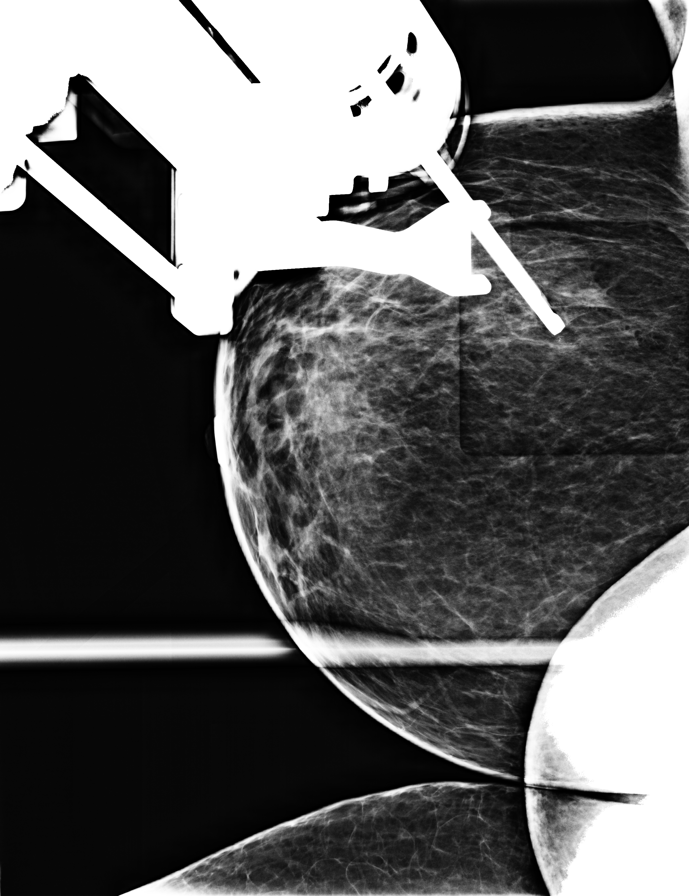

[R SPECIMEN]
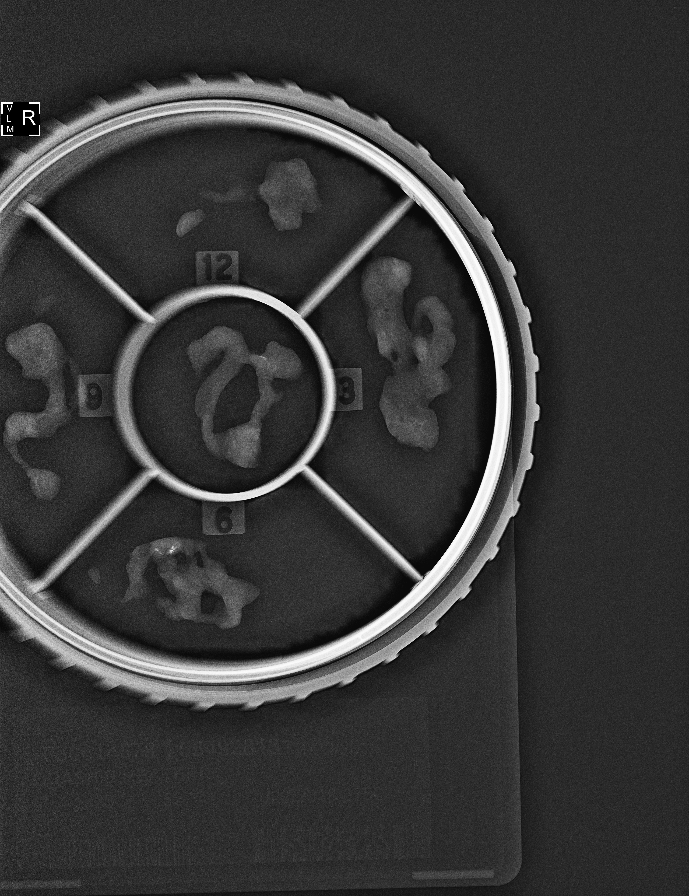

[R LM (6 of 7)]
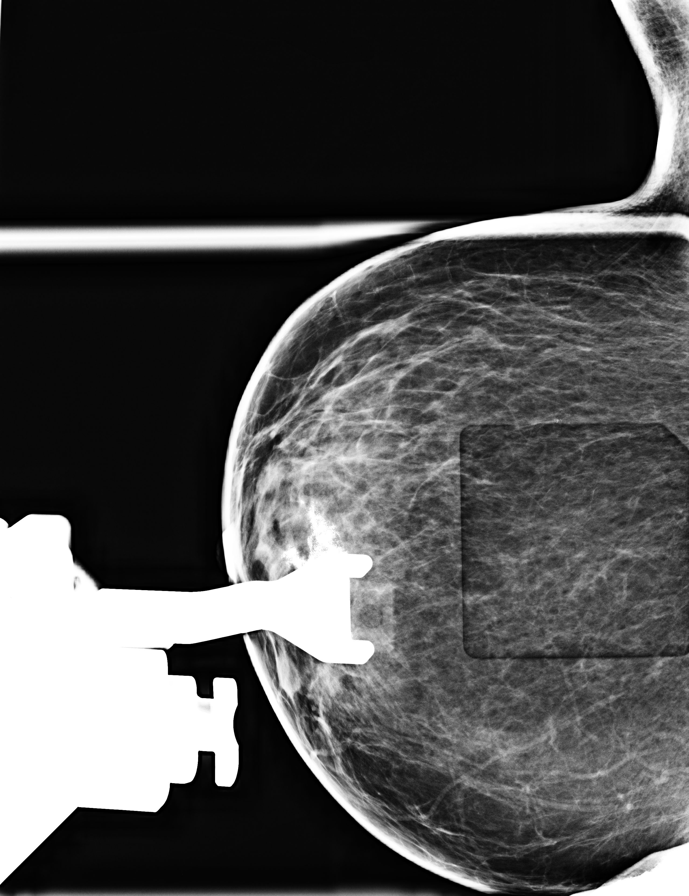

[R LM (7 of 7)]
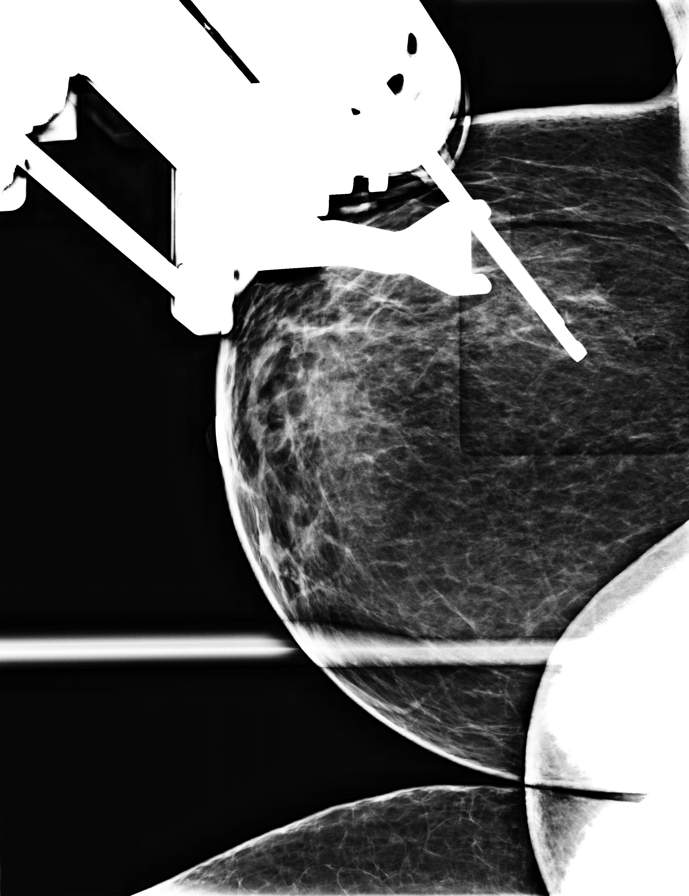

[8 of 14 positions shown; findings below may reference images not displayed]



Using sterile technique and 1% Lidocaine as local anesthetic, under
stereotactic guidance, a 9 gauge vacuum assisted device was used to
perform core needle biopsy of calcifications in the lower outer
quadrant of the right breast using a lateral approach. Specimen
radiograph was performed showing presence of calcifications.
Specimens with calcifications are identified for pathology.

At the conclusion of the procedure, a dumbbell-shaped tissue marker
clip was deployed into the biopsy cavity. Follow-up 2-view mammogram
was performed and dictated separately.
IMPRESSION: Stereotactic-guided biopsy of right breast. No apparent
complications.

## 2016-05-18 LAB — SURGICAL PATHOLOGY

## 2016-05-21 ENCOUNTER — Telehealth: Payer: Self-pay | Admitting: *Deleted

## 2016-05-21 NOTE — Telephone Encounter (Signed)
-----   Message from Lloyd Huger, MD sent at 05/21/2016  1:33 PM EST ----- Contact: [phone] i'll sign it on the condition she gets a PCP asap.  ----- Message ----- From: Telford Nab, RN Sent: 05/21/2016   1:26 PM To: Lloyd Huger, MD, [place] pt back to inform her to get handicap sticker from PCP. Pt states is in transition between providers and does not have PCP at this time. Please advise.  ----- Message ----- From: Lloyd Huger, MD Sent: 05/21/2016  11:04 AM To: Steward Ros, RN  She should try to obtain this from her PCP.   ----- Message ----- From: Cephus Richer Sent: 05/20/2016   3:00 PM To: Lloyd Huger, MD, [facility], RN  Pt would like to know if we could fill out a handicap sticker form for her.

## 2016-05-21 NOTE — Telephone Encounter (Signed)
Parking placard form signed and mailed to patient. Pt aware.

## 2016-06-22 NOTE — Progress Notes (Signed)
[place]  [phone]  [phone]     Tonya Myers DOB: 1965-10-11  MR#: [id number]  HWE#:[id number]  No care team member to display  CHIEF COMPLAINT:  CLL  INTERVAL HISTORY:   Patient returns to clinic today for repeat laboratory work and further evaluation. She continues to be highly anxious, but otherwise feels well and is asymptomatic. She does not complain of weakness or fatigue. She denies any fevers, night sweats, or weight loss. She continues to have shoulder pain which is unchanged.  She has no neurologic complaints. She has no chest pain or shortness of breath. She denies any nausea, vomiting, or diarrhea. She has no urinary complaints.  Patient offers no further specific complaints today.   REVIEW OF SYSTEMS:   Review of Systems  Constitutional: Negative for fever, malaise/fatigue and weight loss.  Respiratory: Negative.  Negative for cough and shortness of breath.   Cardiovascular: Negative.  Negative for chest pain.  Gastrointestinal: Negative for abdominal pain, constipation and nausea.  Genitourinary: Negative.   Musculoskeletal: Positive for joint pain.  Neurological: Negative.  Negative for weakness.  Psychiatric/Behavioral: Negative for depression. The patient is nervous/anxious.     As per HPI. Otherwise, a complete review of systems is negative.   PAST MEDICAL HISTORY: Past Medical History:  Diagnosis Date  . Acute hemorrhoid 03/11/2015  . Anxiety   . Arthritis   . Asthma   . Cancer ([place])   . Chronic back pain   . CLL (chronic lymphocytic leukemia) ([place])   . Depression   . Diabetes mellitus without complication ([place])   . Hypertension   . Hypothyroidism   . Vertigo     PAST SURGICAL HISTORY: Past Surgical History:  Procedure Laterality Date  . ABDOMINAL HYSTERECTOMY    . BREAST BIOPSY Right 05/17/2016   path pending  . COLONOSCOPY WITH PROPOFOL N/A 06/16/2015   Procedure: COLONOSCOPY WITH PROPOFOL;   Surgeon: [name], MD;  Location: [facility] ENDOSCOPY;  Service: Endoscopy;  Laterality: N/A;    FAMILY HISTORY Family History  Problem Relation Age of Onset  . Cancer Paternal Aunt   . Breast cancer Maternal Aunt 19    GYNECOLOGIC HISTORY:  No LMP recorded. Patient has had a hysterectomy.     ADVANCED DIRECTIVES:    HEALTH MAINTENANCE: Social History  Substance Use Topics  . Smoking status: Never Smoker  . Smokeless tobacco: Never Used  . Alcohol use 0.6 oz/week    1 Cans of beer per week     No Known Allergies  Current Outpatient Prescriptions  Medication Sig Dispense Refill  . Blood Glucose Monitoring Suppl (FIFTY50 GLUCOSE METER 2.0) w/Device KIT     . cyclobenzaprine (FLEXERIL) 5 MG tablet Take by mouth.    . Dapagliflozin-Metformin HCl ER 08-998 MG TB24 Take by mouth.    . Diclofenac Sodium CR 100 MG 24 hr tablet Take by mouth.    . diltiazem (TIAZAC) 180 MG 24 hr capsule Take 180 mg by mouth daily.    . Dulaglutide (TRULICITY [place]) Inject 1 Dose into the skin once a week.    . ezetimibe (ZETIA) 10 MG tablet Take by mouth.    Marland Kitchen FARXIGA 10 MG TABS tablet     . furosemide (LASIX) 20 MG tablet Take by mouth. Reported on 07/29/2015    . gabapentin (NEURONTIN) 100 MG capsule Take 100 mg twice a day for one week, then increase to 200 mg(2 tablets) twice a day and continue    .  glucose blood (ONE TOUCH ULTRA TEST) test strip USE THREE TIMES A DAY. USE AS INSTRUCTED    . Insulin Glargine (LANTUS) 100 UNIT/ML Solostar Pen Inject 20 Units into the skin at bedtime.    . Insulin Pen Needle (FIFTY50 PEN NEEDLES) 31G X 5 MM MISC Use once daily.    . irbesartan (AVAPRO) 300 MG tablet Take by mouth.    Elmore Guise Devices ([employer] LANCING DEVICE) MISC Use 1 each 3 (three) times daily. Use as instructed.    Marland Kitchen levothyroxine (SYNTHROID, LEVOTHROID) 150 MCG tablet Take by mouth.    . lidocaine (LIDODERM) 5 % Place onto the skin. Reported on 07/29/2015    . ONETOUCH DELICA LANCETS 81E MISC  USE THREE TIMES A DAY. USE AS INSTRUCTED    . oxybutynin (DITROPAN) 5 MG tablet Take by mouth.    . rosuvastatin (CRESTOR) 10 MG tablet Take by mouth.    . valGANciclovir (VALCYTE) 450 MG tablet Take by mouth.     No current facility-administered medications for this visit.     OBJECTIVE: BP 135/86 (BP Location: Right Arm, Patient Position: Sitting)   Pulse 81   Temp 98.9 F (37.2 C) (Tympanic)   Wt 229 lb 4.5 oz (104 kg)   BMI 31.10 kg/m    Body mass index is 31.1 kg/m.    ECOG FS:1 - Symptomatic but completely ambulatory  General: Well-developed, well-nourished, no acute distress.  Eyes: Pink conjunctiva, anicteric sclera. Lungs: Clear to auscultation bilaterally. Heart: Regular rate and rhythm. No rubs, murmurs, or gallops. Abdomen: Soft, nontender, nondistended. No organomegaly noted, normoactive bowel sounds. Musculoskeletal: No edema, cyanosis, or clubbing. Neuro: Alert, answering all questions appropriately. Cranial nerves grossly intact. Skin: No rashes or petechiae noted. Psych: Normal affect. Lymphatics: No cervical, clavicular, axillary LAD.    LAB RESULTS:  Appointment on 06/23/2016  Component Date Value Ref Range Status  . WBC 06/23/2016 6.0  3.6 - 11.0 K/uL Final  . RBC 06/23/2016 4.91  3.80 - 5.20 MIL/uL Final  . Hemoglobin 06/23/2016 12.9  12.0 - 16.0 g/dL Final  . HCT 06/23/2016 38.9  35.0 - 47.0 % Final  . MCV 06/23/2016 79.2* 80.0 - 100.0 fL Final  . MCH 06/23/2016 26.3  26.0 - 34.0 pg Final  . MCHC 06/23/2016 33.2  32.0 - 36.0 g/dL Final  . RDW 06/23/2016 14.4  11.5 - 14.5 % Final  . Platelets 06/23/2016 344  150 - 440 K/uL Final  . Neutrophils Relative % 06/23/2016 50  % Final  . Neutro Abs 06/23/2016 3.0  1.4 - 6.5 K/uL Final  . Lymphocytes Relative 06/23/2016 39  % Final  . Lymphs Abs 06/23/2016 2.3  1.0 - 3.6 K/uL Final  . Monocytes Relative 06/23/2016 7  % Final  . Monocytes Absolute 06/23/2016 0.4  0.2 - 0.9 K/uL Final  . Eosinophils Relative  06/23/2016 3  % Final  . Eosinophils Absolute 06/23/2016 0.2  0 - 0.7 K/uL Final  . Basophils Relative 06/23/2016 1  % Final  . Basophils Absolute 06/23/2016 0.1  0 - 0.1 K/uL Final  . Sodium 06/23/2016 138  135 - 145 mmol/L Final  . Potassium 06/23/2016 3.8  3.5 - 5.1 mmol/L Final  . Chloride 06/23/2016 103  101 - 111 mmol/L Final  . CO2 06/23/2016 26  22 - 32 mmol/L Final  . Glucose, Bld 06/23/2016 210* 65 - 99 mg/dL Final  . BUN 06/23/2016 13  6 - 20 mg/dL Final  . Creatinine, Ser 06/23/2016 0.77  0.44 - 1.00 mg/dL Final  . Calcium 06/23/2016 9.6  8.9 - 10.3 mg/dL Final  . Total Protein 06/23/2016 7.4  6.5 - 8.1 g/dL Final  . Albumin 06/23/2016 4.4  3.5 - 5.0 g/dL Final  . AST 06/23/2016 32  15 - 41 U/L Final  . ALT 06/23/2016 35  14 - 54 U/L Final  . Alkaline Phosphatase 06/23/2016 133* 38 - 126 U/L Final  . Total Bilirubin 06/23/2016 0.6  0.3 - 1.2 mg/dL Final  . GFR calc non Af Amer 06/23/2016 >60  >60 mL/min Final  . GFR calc Af Amer 06/23/2016 >60  >60 mL/min Final   Comment: (NOTE) The eGFR has been calculated using the CKD EPI equation. This calculation has not been validated in all clinical situations. eGFR's persistently <60 mL/min signify possible Chronic Kidney Disease.   . Anion gap 06/23/2016 9  5 - 15 Final  . LDH 06/23/2016 124  98 - 192 U/L Final    STUDIES: No results found.  ASSESSMENT: CLL  PLAN:   1. CLL: No evidence of disease. Patient's white blood cell count continues to be within normal limits.  CT scan results from April 13, 2016 were reviewed independently with no evidence of disease. Patient does not require additional treatment at this time. Patient has also requested no further CTs be done unless there is consideration of reinitiation of treatment, but she may agree to yearly scans. If patient in fact does need to be retreated, her she will need to be heavily pretreated since patient had a reaction to Rituxan during cycle 1. The remainder of her  infusions proceeded without problem. Return to clinic in 6 months with repeat laboratory work and further evaluation. 2. Anxiety: Improved. Patient will require Ativan with any future CT scan. 3. Hyperglycemia: Continue current diabetic medications. Treatment per PCP. 4. Anemia: Resolved. Patient had a colonoscopy in February 2017 that was reported as normal. 5. Abnormal mammogram: Biopsy negative for malignancy.  Patient expressed understanding and was in agreement with this plan. She also understands that She can call clinic at any time with any questions, concerns, or complaints.   Lloyd Huger, MD   06/26/2016 6:09 PM

## 2016-06-23 ENCOUNTER — Other Ambulatory Visit: Payer: Self-pay

## 2016-06-23 ENCOUNTER — Encounter: Payer: Self-pay | Admitting: Oncology

## 2016-06-23 ENCOUNTER — Inpatient Hospital Stay: Payer: Medicare Other | Attending: Oncology | Admitting: Oncology

## 2016-06-23 ENCOUNTER — Inpatient Hospital Stay: Payer: Medicare Other

## 2016-06-23 VITALS — BP 135/86 | HR 81 | Temp 98.9°F | Wt 229.3 lb

## 2016-06-23 DIAGNOSIS — E039 Hypothyroidism, unspecified: Secondary | ICD-10-CM | POA: Insufficient documentation

## 2016-06-23 DIAGNOSIS — Z803 Family history of malignant neoplasm of breast: Secondary | ICD-10-CM | POA: Diagnosis not present

## 2016-06-23 DIAGNOSIS — R42 Dizziness and giddiness: Secondary | ICD-10-CM | POA: Insufficient documentation

## 2016-06-23 DIAGNOSIS — G8929 Other chronic pain: Secondary | ICD-10-CM | POA: Diagnosis not present

## 2016-06-23 DIAGNOSIS — F329 Major depressive disorder, single episode, unspecified: Secondary | ICD-10-CM | POA: Diagnosis not present

## 2016-06-23 DIAGNOSIS — M25519 Pain in unspecified shoulder: Secondary | ICD-10-CM | POA: Insufficient documentation

## 2016-06-23 DIAGNOSIS — Z794 Long term (current) use of insulin: Secondary | ICD-10-CM | POA: Insufficient documentation

## 2016-06-23 DIAGNOSIS — K649 Unspecified hemorrhoids: Secondary | ICD-10-CM | POA: Diagnosis not present

## 2016-06-23 DIAGNOSIS — J45909 Unspecified asthma, uncomplicated: Secondary | ICD-10-CM | POA: Diagnosis not present

## 2016-06-23 DIAGNOSIS — M549 Dorsalgia, unspecified: Secondary | ICD-10-CM | POA: Diagnosis not present

## 2016-06-23 DIAGNOSIS — C9110 Chronic lymphocytic leukemia of B-cell type not having achieved remission: Secondary | ICD-10-CM

## 2016-06-23 DIAGNOSIS — Z800 Family history of malignant neoplasm of digestive organs: Secondary | ICD-10-CM | POA: Insufficient documentation

## 2016-06-23 DIAGNOSIS — I10 Essential (primary) hypertension: Secondary | ICD-10-CM | POA: Insufficient documentation

## 2016-06-23 DIAGNOSIS — Z79899 Other long term (current) drug therapy: Secondary | ICD-10-CM | POA: Diagnosis not present

## 2016-06-23 DIAGNOSIS — F419 Anxiety disorder, unspecified: Secondary | ICD-10-CM

## 2016-06-23 DIAGNOSIS — C9111 Chronic lymphocytic leukemia of B-cell type in remission: Secondary | ICD-10-CM

## 2016-06-23 DIAGNOSIS — M129 Arthropathy, unspecified: Secondary | ICD-10-CM | POA: Diagnosis not present

## 2016-06-23 DIAGNOSIS — E1165 Type 2 diabetes mellitus with hyperglycemia: Secondary | ICD-10-CM

## 2016-06-23 LAB — COMPREHENSIVE METABOLIC PANEL
ALBUMIN: 4.4 g/dL (ref 3.5–5.0)
ALT: 35 U/L (ref 14–54)
AST: 32 U/L (ref 15–41)
Alkaline Phosphatase: 133 U/L — ABNORMAL HIGH (ref 38–126)
Anion gap: 9 (ref 5–15)
BILIRUBIN TOTAL: 0.6 mg/dL (ref 0.3–1.2)
BUN: 13 mg/dL (ref 6–20)
CHLORIDE: 103 mmol/L (ref 101–111)
CO2: 26 mmol/L (ref 22–32)
CREATININE: 0.77 mg/dL (ref 0.44–1.00)
Calcium: 9.6 mg/dL (ref 8.9–10.3)
GFR calc Af Amer: >60 mL/min (ref >60)
GLUCOSE: 210 mg/dL — AB (ref 65–99)
Potassium: 3.8 mmol/L (ref 3.5–5.1)
Sodium: 138 mmol/L (ref 135–145)
Total Protein: 7.4 g/dL (ref 6.5–8.1)

## 2016-06-23 LAB — CBC WITH DIFFERENTIAL/PLATELET
BASOS ABS: 0.1 10*3/uL (ref 0–0.1)
Basophils Relative: 1 %
Eosinophils Absolute: 0.2 10*3/uL (ref 0–0.7)
Eosinophils Relative: 3 %
HEMATOCRIT: 38.9 % (ref 35.0–47.0)
Hemoglobin: 12.9 g/dL (ref 12.0–16.0)
LYMPHS PCT: 39 %
Lymphs Abs: 2.3 10*3/uL (ref 1.0–3.6)
MCH: 26.3 pg (ref 26.0–34.0)
MCHC: 33.2 g/dL (ref 32.0–36.0)
MCV: 79.2 fL — AB (ref 80.0–100.0)
Monocytes Absolute: 0.4 10*3/uL (ref 0.2–0.9)
Monocytes Relative: 7 %
NEUTROS ABS: 3 10*3/uL (ref 1.4–6.5)
Neutrophils Relative %: 50 %
PLATELETS: 344 10*3/uL (ref 150–440)
RBC: 4.91 MIL/uL (ref 3.80–5.20)
RDW: 14.4 % (ref 11.5–14.5)
WBC: 6 10*3/uL (ref 3.6–11.0)

## 2016-06-23 LAB — LACTATE DEHYDROGENASE: LDH: 124 U/L (ref 98–192)

## 2016-11-01 ENCOUNTER — Encounter: Payer: Self-pay | Admitting: Obstetrics and Gynecology

## 2016-11-01 ENCOUNTER — Ambulatory Visit (INDEPENDENT_AMBULATORY_CARE_PROVIDER_SITE_OTHER): Payer: Medicare Other | Admitting: Obstetrics and Gynecology

## 2016-11-01 VITALS — BP 122/80 | HR 82 | Ht 72.0 in | Wt 235.0 lb

## 2016-11-01 DIAGNOSIS — N898 Other specified noninflammatory disorders of vagina: Secondary | ICD-10-CM

## 2016-11-01 DIAGNOSIS — N760 Acute vaginitis: Secondary | ICD-10-CM | POA: Diagnosis not present

## 2016-11-01 DIAGNOSIS — B9689 Other specified bacterial agents as the cause of diseases classified elsewhere: Secondary | ICD-10-CM | POA: Insufficient documentation

## 2016-11-01 LAB — POCT WET PREP WITH KOH
CLUE CELLS WET PREP PER HPF POC: NEGATIVE
KOH Prep POC: NEGATIVE
Trichomonas, UA: NEGATIVE
YEAST WET PREP PER HPF POC: NEGATIVE

## 2016-11-01 MED ORDER — METRONIDAZOLE 500 MG PO TABS: ORAL_TABLET | ORAL | 0 refills | Status: DC

## 2016-11-01 NOTE — Progress Notes (Signed)
Chief Complaint  Patient presents with  . Vaginitis    Grennish/brownish discharge/sm. odor x 1.5 weeks    HPI:      [name] is a 51 y.o. No obstetric history on file. who LMP was No LMP recorded. Patient has had a hysterectomy. (supracx hyst with BSO), presents today for increased vag d/c with mild odor, no irritation for the past 1 1/2 wks. She has a hx of BV in the past. No urin sx, LBP, fevers. She has not been sex active in over 4 yrs and has had neg STD testing since then.  She is in remission from CLL.    Patient Active Problem List   Diagnosis Date Noted  . Bacterial vaginosis 11/01/2016  . Chronic pain in right shoulder 03/23/2016  . Numbness and tingling 03/23/2016  . Uncontrolled type 2 diabetes mellitus with hyperglycemia, without long-term current use of insulin ([place]) 10/27/2015  . DDD (degenerative disc disease), cervical 07/29/2015  . Cervical disc disorder with radiculopathy of cervical region 07/29/2015  . Cervical facet syndrome ([place]) 07/29/2015  . DDD (degenerative disc disease), lumbar 07/29/2015  . Facet syndrome, lumbar ([place]) 07/29/2015  . Bilateral occipital neuralgia 07/29/2015  . Sacroiliac joint dysfunction 07/29/2015  . DJD of shoulder 07/29/2015  . Herpes simplex vulvovaginitis 05/29/2015  . Acute hemorrhoid 03/11/2015  . CLL (chronic lymphocytic leukemia) ([place]) 01/15/2015  . Acquired hypothyroidism 12/26/2014  . Arthritis 12/26/2014  . Carpal tunnel syndrome, bilateral 12/26/2014  . Controlled type 2 diabetes mellitus without complication, without long-term current use of insulin ([place]) 12/26/2014  . Essential hypertension 12/26/2014  . Hyperlipidemia, mixed 12/26/2014    Family History  Problem Relation Age of Onset  . Cancer Paternal Aunt   . Breast cancer Maternal Aunt 70    Social History   Social History  . Marital status: Single    Spouse name: N/A  . Number of children: N/A  . Years of education: N/A   Occupational  History  . Not on file.   Social History Main Topics  . Smoking status: Never Smoker  . Smokeless tobacco: Never Used  . Alcohol use 0.6 oz/week    1 Cans of beer per week  . Drug use: No  . Sexual activity: Not Currently   Other Topics Concern  . Not on file   Social History Narrative  . No narrative on file     Current Outpatient Prescriptions:  .  Blood Glucose Monitoring Suppl (FIFTY50 GLUCOSE METER 2.0) w/Device KIT, , Disp: , Rfl:  .  cyclobenzaprine (FLEXERIL) 5 MG tablet, Take by mouth., Disp: , Rfl:  .  Dapagliflozin-Metformin HCl ER 08-998 MG TB24, Take by mouth., Disp: , Rfl:  .  Diclofenac Sodium CR 100 MG 24 hr tablet, Take by mouth., Disp: , Rfl:  .  diltiazem (TIAZAC) 180 MG 24 hr capsule, Take 180 mg by mouth daily., Disp: , Rfl:  .  Dulaglutide (TRULICITY [place]), Inject 1 Dose into the skin once a week., Disp: , Rfl:  .  ezetimibe (ZETIA) 10 MG tablet, Take by mouth., Disp: , Rfl:  .  FARXIGA 10 MG TABS tablet, , Disp: , Rfl:  .  furosemide (LASIX) 20 MG tablet, Take by mouth. Reported on 07/29/2015, Disp: , Rfl:  .  gabapentin (NEURONTIN) 100 MG capsule, Take 100 mg twice a day for one week, then increase to 200 mg(2 tablets) twice a day and continue, Disp: , Rfl:  .  glucose blood (ONE TOUCH ULTRA  TEST) test strip, USE THREE TIMES A DAY. USE AS INSTRUCTED, Disp: , Rfl:  .  Insulin Glargine (LANTUS) 100 UNIT/ML Solostar Pen, Inject 20 Units into the skin at bedtime., Disp: , Rfl:  .  Insulin Pen Needle (FIFTY50 PEN NEEDLES) 31G X 5 MM MISC, Use once daily., Disp: , Rfl:  .  irbesartan (AVAPRO) 300 MG tablet, Take by mouth., Disp: , Rfl:  .  levothyroxine (SYNTHROID, LEVOTHROID) 150 MCG tablet, Take by mouth., Disp: , Rfl:  .  lidocaine (LIDODERM) 5 %, Place onto the skin. Reported on 07/29/2015, Disp: , Rfl:  .  ONETOUCH DELICA LANCETS 16X MISC, USE THREE TIMES A DAY. USE AS INSTRUCTED, Disp: , Rfl:  .  rosuvastatin (CRESTOR) 10 MG tablet, Take by mouth., Disp: , Rfl:    .  valGANciclovir (VALCYTE) 450 MG tablet, Take by mouth., Disp: , Rfl:  .  metroNIDAZOLE (FLAGYL) 500 MG tablet, Take 2 tabs BID for 1 day, Disp: 4 tablet, Rfl: 0  Review of Systems  Constitutional: Negative for fever.  Gastrointestinal: Negative for blood in stool, constipation, diarrhea, nausea and vomiting.  Genitourinary: Positive for vaginal discharge. Negative for dyspareunia, dysuria, flank pain, frequency, hematuria, urgency, vaginal bleeding and vaginal pain.  Musculoskeletal: Negative for back pain.  Skin: Negative for rash.     OBJECTIVE:   Vitals:  BP 122/80 (BP Location: Left Arm, Patient Position: Sitting, Cuff Size: Large)   Pulse 82   Ht 6' (1.829 m)   Wt 235 lb (106.6 kg)   BMI 31.87 kg/m   Physical Exam  Constitutional: She is oriented to person, place, and time and well-developed, well-nourished, and in no distress. Vital signs are normal.  Genitourinary: Uterus normal, cervix normal, right adnexa normal, left adnexa normal and vulva normal. Uterus is not enlarged. Cervix exhibits no motion tenderness and no tenderness. Right adnexum displays no mass and no tenderness. Left adnexum displays no mass and no tenderness. Vulva exhibits no erythema, no exudate, no lesion, no rash and no tenderness. Vagina exhibits no lesion. Thin  odorless  clear and vaginal discharge found.  Neurological: She is oriented to person, place, and time.  Vitals reviewed.   Results: Results for orders placed or performed in visit on 11/01/16 (from the past 24 hour(s))  POCT Wet Prep with KOH     Status: Normal   Collection Time: 11/01/16  4:46 PM  Result Value Ref Range   Trichomonas, UA Negative    Clue Cells Wet Prep HPF POC neg    Epithelial Wet Prep HPF POC  Few, Moderate, Many, Too numerous to count   Yeast Wet Prep HPF POC neg    Bacteria Wet Prep HPF POC  Few   RBC Wet Prep HPF POC     WBC Wet Prep HPF POC     KOH Prep POC Negative Negative      Assessment/Plan: Bacterial vaginosis - Neg wet prep/pos sx hx. Treat empirically with flagyl. F/u prn. No EtOH. - Plan: POCT Wet Prep with KOH, metroNIDAZOLE (FLAGYL) 500 MG tablet  Vaginal discharge     Meds ordered this encounter  Medications  . metroNIDAZOLE (FLAGYL) 500 MG tablet    Sig: Take 2 tabs BID for 1 day    Dispense:  4 tablet    Refill:  0      Return if symptoms worsen or fail to improve.  Iantha Titsworth B. Yeraldin Litzenberger, PA-C 11/01/2016 4:46 PM

## 2016-11-02 ENCOUNTER — Emergency Department
Admission: EM | Admit: 2016-11-02 | Discharge: 2016-11-02 | Disposition: A | Discharge status: Home / Self Care | Payer: Medicare Other | Attending: Emergency Medicine | Admitting: Emergency Medicine

## 2016-11-02 ENCOUNTER — Emergency Department: Payer: Medicare Other

## 2016-11-02 ENCOUNTER — Encounter: Payer: Self-pay | Admitting: *Deleted

## 2016-11-02 DIAGNOSIS — Y999 Unspecified external cause status: Secondary | ICD-10-CM | POA: Insufficient documentation

## 2016-11-02 DIAGNOSIS — Z794 Long term (current) use of insulin: Secondary | ICD-10-CM | POA: Diagnosis not present

## 2016-11-02 DIAGNOSIS — J45909 Unspecified asthma, uncomplicated: Secondary | ICD-10-CM | POA: Diagnosis not present

## 2016-11-02 DIAGNOSIS — Z859 Personal history of malignant neoplasm, unspecified: Secondary | ICD-10-CM | POA: Insufficient documentation

## 2016-11-02 DIAGNOSIS — Y939 Activity, unspecified: Secondary | ICD-10-CM | POA: Insufficient documentation

## 2016-11-02 DIAGNOSIS — W010XXA Fall on same level from slipping, tripping and stumbling without subsequent striking against object, initial encounter: Secondary | ICD-10-CM | POA: Insufficient documentation

## 2016-11-02 DIAGNOSIS — Z79899 Other long term (current) drug therapy: Secondary | ICD-10-CM | POA: Diagnosis not present

## 2016-11-02 DIAGNOSIS — S8992XA Unspecified injury of left lower leg, initial encounter: Secondary | ICD-10-CM | POA: Diagnosis present

## 2016-11-02 DIAGNOSIS — E119 Type 2 diabetes mellitus without complications: Secondary | ICD-10-CM | POA: Insufficient documentation

## 2016-11-02 DIAGNOSIS — S8002XA Contusion of left knee, initial encounter: Secondary | ICD-10-CM | POA: Diagnosis not present

## 2016-11-02 DIAGNOSIS — Y929 Unspecified place or not applicable: Secondary | ICD-10-CM | POA: Diagnosis not present

## 2016-11-02 DIAGNOSIS — E039 Hypothyroidism, unspecified: Secondary | ICD-10-CM | POA: Insufficient documentation

## 2016-11-02 DIAGNOSIS — I10 Essential (primary) hypertension: Secondary | ICD-10-CM | POA: Diagnosis not present

## 2016-11-02 IMAGING — DX DG KNEE COMPLETE 4+V*L*
4 series · 4 of 4 positions shown · non-contrast
Comparison: None.

CLINICAL DATA: Fell down steps today. Patellar region and medial
knee pain.

EXAM:
LEFT KNEE - COMPLETE 4+ VIEW

[knee ap]
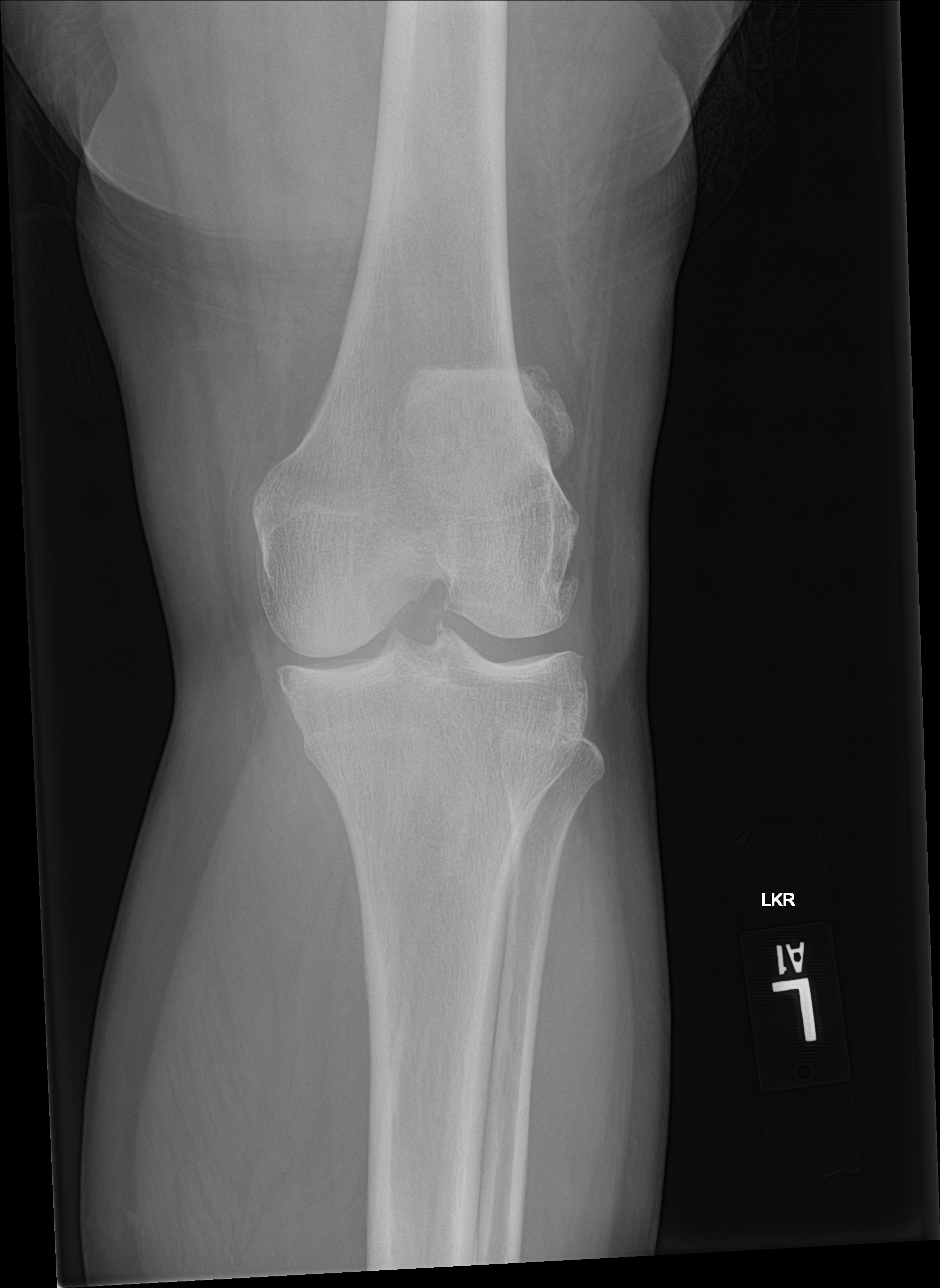

[knee lat]
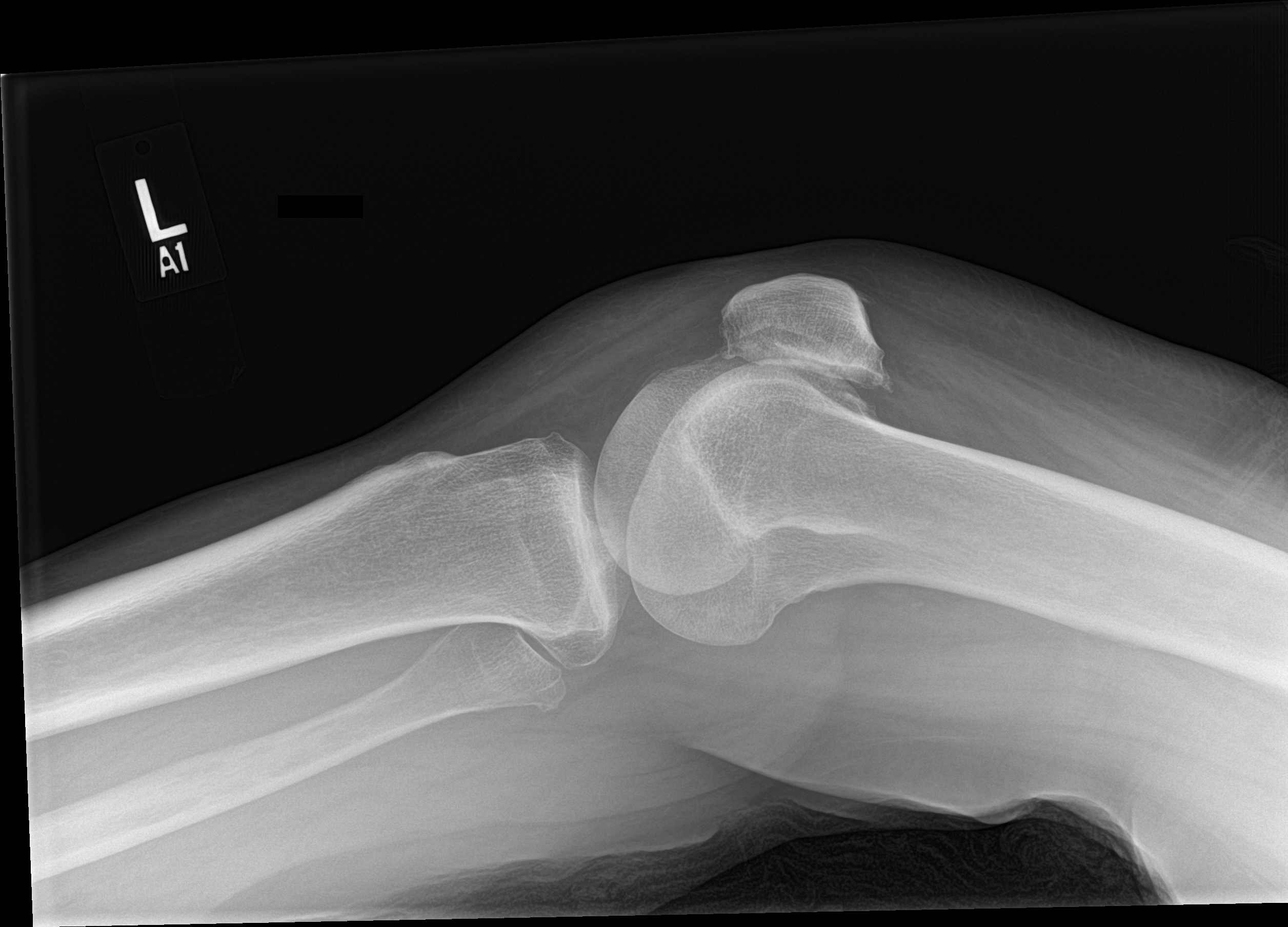

[knee obl (1 of 2)]
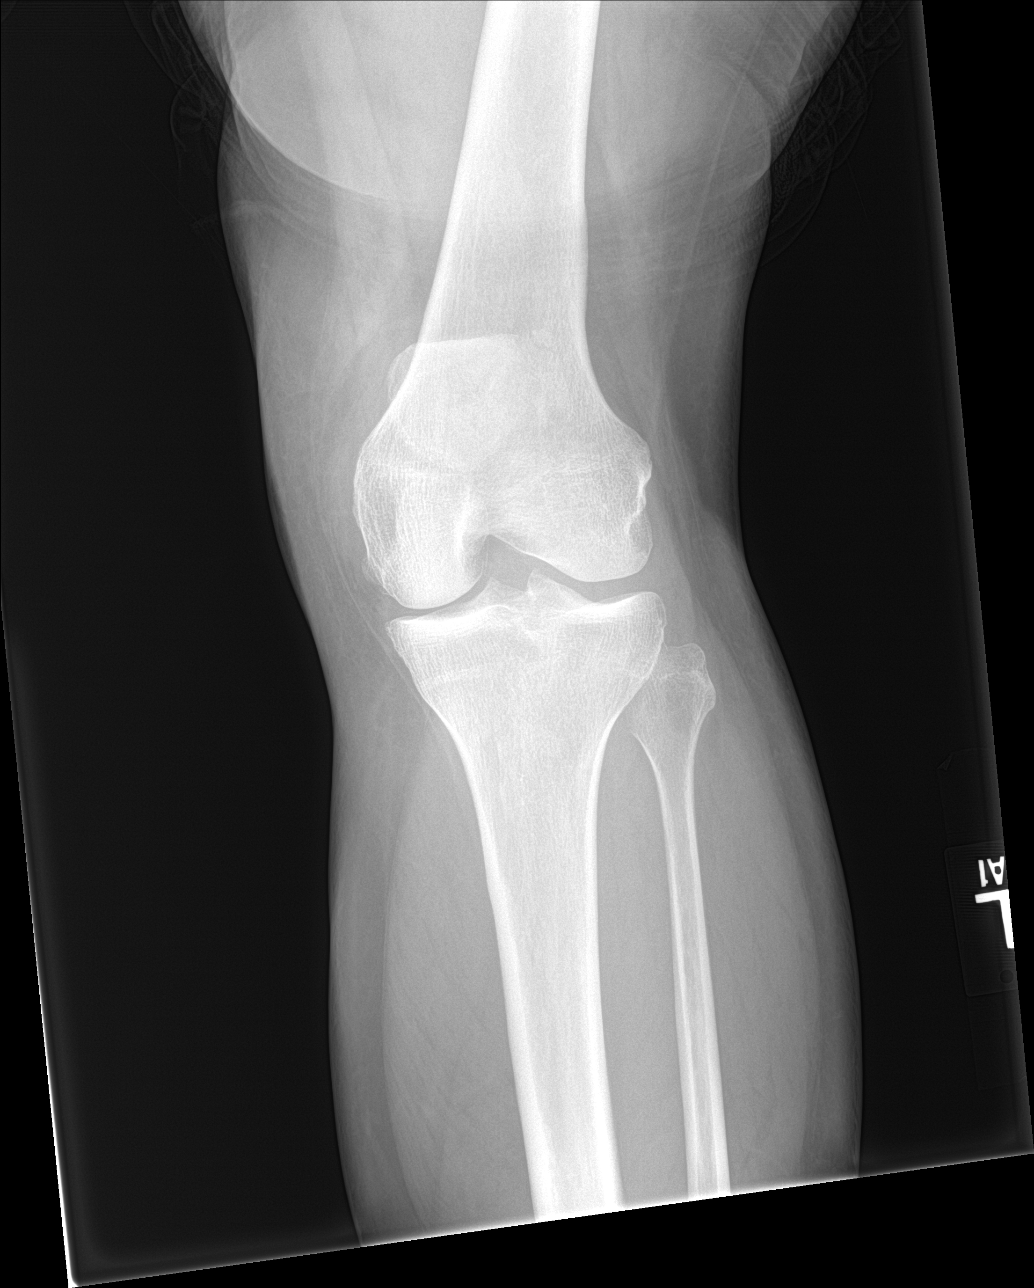

[knee obl (2 of 2)]
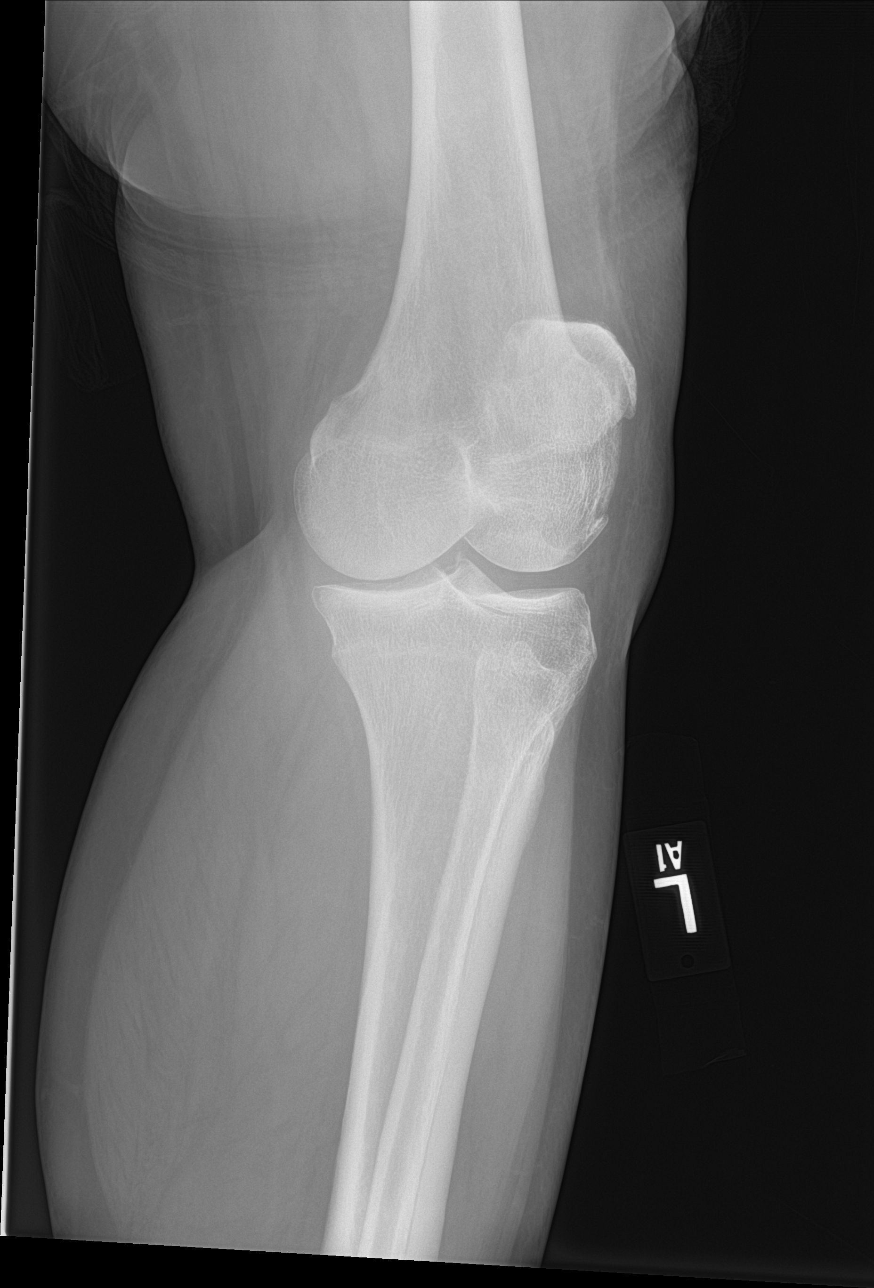

[4 of 4 positions shown; findings below may reference images not displayed]

FINDINGS: Mild joint space narrowing in the medial compartment. Small marginal
osteophytes in the lateral compartment. Patellofemoral joint space
narrowing and osteophytes. No evidence of fracture or joint
effusion.
IMPRESSION: No acute or traumatic finding.  Osteoarthritis as above.

## 2016-11-02 MED ORDER — HYDROCODONE-IBUPROFEN 7.5-200 MG PO TABS: 1.0000 | ORAL_TABLET | Freq: Four times a day (QID) | ORAL | 0 refills | Status: AC | PRN

## 2016-11-02 MED ORDER — KETOROLAC TROMETHAMINE 60 MG/2ML IM SOLN
60.0000 mg | Freq: Once | INTRAMUSCULAR | Status: AC
  Administered 2016-11-02: 60 mg via INTRAMUSCULAR
  Filled 2016-11-02: qty 2

## 2016-11-02 NOTE — ED Notes (Signed)
See triage note  States she fell down several steps  Got her left leg bent back behind her  Having pain to left knee ,back and hip

## 2016-11-02 NOTE — ED Triage Notes (Signed)
States left knee pain after she fell down multiple steps this AM, arrives with ACE wrap on knee, pt eating a burger in lobby

## 2016-11-02 NOTE — ED Provider Notes (Signed)
[facility] Emergency Department Provider Note   ____________________________________________   None    (approximate)  I have reviewed the triage vital signs and the nursing notes.   HISTORY  Chief Complaint Knee Pain    HPI Tonya Myers is a 51 y.o. female patient complaining of left knee pain secondary to a fall. Patient states she fell down multiple steps this morning.Patient rates the pain as a 10 over 10. Patient described a pain as "achy". No palliative measures to surrounding the triage which time she was given ice pack.   Past Medical History:  Diagnosis Date  . Acute hemorrhoid 03/11/2015  . Anxiety   . Arthritis   . Asthma   . Cancer ([place])   . Chronic back pain   . CLL (chronic lymphocytic leukemia) ([place])   . Depression   . Diabetes mellitus without complication ([place])   . Hypertension   . Hypothyroidism   . Vertigo     Patient Active Problem List   Diagnosis Date Noted  . Bacterial vaginosis 11/01/2016  . Chronic pain in right shoulder 03/23/2016  . Numbness and tingling 03/23/2016  . Uncontrolled type 2 diabetes mellitus with hyperglycemia, without long-term current use of insulin ([place]) 10/27/2015  . DDD (degenerative disc disease), cervical 07/29/2015  . Cervical disc disorder with radiculopathy of cervical region 07/29/2015  . Cervical facet syndrome ([place]) 07/29/2015  . DDD (degenerative disc disease), lumbar 07/29/2015  . Facet syndrome, lumbar ([place]) 07/29/2015  . Bilateral occipital neuralgia 07/29/2015  . Sacroiliac joint dysfunction 07/29/2015  . DJD of shoulder 07/29/2015  . Herpes simplex vulvovaginitis 05/29/2015  . Acute hemorrhoid 03/11/2015  . CLL (chronic lymphocytic leukemia) ([place]) 01/15/2015  . Acquired hypothyroidism 12/26/2014  . Arthritis 12/26/2014  . Carpal tunnel syndrome, bilateral 12/26/2014  . Controlled type 2 diabetes mellitus without complication, without long-term current use of insulin  ([place]) 12/26/2014  . Essential hypertension 12/26/2014  . Hyperlipidemia, mixed 12/26/2014    Past Surgical History:  Procedure Laterality Date  . ABDOMINAL HYSTERECTOMY    . BREAST BIOPSY Right 05/17/2016   path pending  . COLONOSCOPY WITH PROPOFOL N/A 06/16/2015   Procedure: COLONOSCOPY WITH PROPOFOL;  Surgeon: [name], MD;  Location: [facility] ENDOSCOPY;  Service: Endoscopy;  Laterality: N/A;    Prior to Admission medications   Medication Sig Start Date End Date Taking? Authorizing Provider  Blood Glucose Monitoring Suppl (FIFTY50 GLUCOSE METER 2.0) w/Device KIT  02/25/15   [provider]  cyclobenzaprine (FLEXERIL) 5 MG tablet Take by mouth.    [provider]  Dapagliflozin-Metformin HCl ER 08-998 MG TB24 Take by mouth. 06/23/16   [provider]  Diclofenac Sodium CR 100 MG 24 hr tablet Take by mouth.    [provider]  diltiazem (TIAZAC) 180 MG 24 hr capsule Take 180 mg by mouth daily.    [provider]  Dulaglutide (TRULICITY [place]) Inject 1 Dose into the skin once a week.    [provider]  ezetimibe (ZETIA) 10 MG tablet Take by mouth.    [provider]  FARXIGA 10 MG TABS tablet  04/21/16   [provider]  furosemide (LASIX) 20 MG tablet Take by mouth. Reported on 07/29/2015    [provider]  gabapentin (NEURONTIN) 100 MG capsule Take 100 mg twice a day for one week, then increase to 200 mg(2 tablets) twice a day and continue 05/18/16   [provider]  glucose blood (ONE TOUCH ULTRA TEST) test  strip USE THREE TIMES A DAY. USE AS INSTRUCTED 04/06/16   [provider]  HYDROcodone-ibuprofen (VICOPROFEN) 7.5-200 MG tablet Take 1 tablet by mouth every 6 (six) hours as needed for moderate pain. 11/02/16 11/02/17  Sable Feil, PA-C  Insulin Glargine (LANTUS) 100 UNIT/ML Solostar Pen Inject 20 Units into the skin at bedtime. 06/23/16   [provider]  Insulin Pen Needle  (FIFTY50 PEN NEEDLES) 31G X 5 MM MISC Use once daily. 04/06/16 04/06/17  [provider]  irbesartan (AVAPRO) 300 MG tablet Take by mouth.    [provider]  levothyroxine (SYNTHROID, LEVOTHROID) 150 MCG tablet Take by mouth.    [provider]  lidocaine (LIDODERM) 5 % Place onto the skin. Reported on 07/29/2015    [provider]  metroNIDAZOLE (FLAGYL) 500 MG tablet Take 2 tabs BID for 1 day 06/04/50   Copland, Alicia B, PA-C  ONETOUCH DELICA LANCETS 84X MISC USE THREE TIMES A DAY. USE AS INSTRUCTED 04/06/16   [provider]  rosuvastatin (CRESTOR) 10 MG tablet Take by mouth.    [provider]  valGANciclovir (VALCYTE) 450 MG tablet Take by mouth. 04/06/16   [provider]    Allergies Patient has no known allergies.  Family History  Problem Relation Age of Onset  . Cancer Paternal Aunt   . Breast cancer Maternal Aunt 70    Social History Social History  Substance Use Topics  . Smoking status: Never Smoker  . Smokeless tobacco: Never Used  . Alcohol use 0.6 oz/week    1 Cans of beer per week    Review of Systems  Constitutional: No fever/chills Eyes: No visual changes. ENT: No sore throat. Cardiovascular: Denies chest pain. Respiratory: Denies shortness of breath. Gastrointestinal: No abdominal pain.  No nausea, no vomiting.  No diarrhea.  No constipation. Genitourinary: Negative for dysuria. Musculoskeletal: Positive for chronic back pain. Skin: Negative for rash. Neurological: Negative for headaches, focal weakness or numbness. Psychiatric:Anxiety/depression Endocrine:Diabetes, hypertension, hypothyroidism.  ____________________________________________   PHYSICAL EXAM:  VITAL SIGNS: ED Triage Vitals  Enc Vitals Group     BP 11/02/16 1140 121/80     Pulse Rate 11/02/16 1140 85     Resp 11/02/16 1140 20     Temp 11/02/16 1140 99.8 F (37.7 C)     Temp Source 11/02/16 1140 Oral     SpO2  11/02/16 1140 98 %     Weight 11/02/16 1140 230 lb (104.3 kg)     Height 11/02/16 1140 6' (1.829 m)     Head Circumference --      Peak Flow --      Pain Score 11/02/16 1136 10     Pain Loc --      Pain Edu? --      Excl. in [place]? --     Constitutional: Alert and oriented. Well appearing and in no acute distress. Cardiovascular: Normal rate, regular rhythm. Grossly normal heart sounds.  Good peripheral circulation. Respiratory: Normal respiratory effort.  No retractions. Lungs CTAB. Musculoskeletal: No obvious deformity to the left knee. No obvious joint effusion. Patient's tender palpation anterior patella. Patient decreased range of motion with flexion of the knee.  Neurologic:  Normal speech and language. No gross focal neurologic deficits are appreciated. No gait instability. Skin:  Skin is warm, dry and intact. No rash noted. Psychiatric: Mood and affect are normal. Speech and behavior are normal.  ____________________________________________   LABS (all labs ordered are listed, but only abnormal results  are displayed)  Labs Reviewed - No data to display ____________________________________________  EKG   ____________________________________________  RADIOLOGY  Dg Knee Complete 4 Views Left  Result Date: 11/02/2016 CLINICAL DATA:  [place] down steps today. Patellar region and medial knee pain. EXAM: LEFT KNEE - COMPLETE 4+ VIEW COMPARISON:  None. FINDINGS: Mild joint space narrowing in the medial compartment. Small marginal osteophytes in the lateral compartment. Patellofemoral joint space narrowing and osteophytes. No evidence of fracture or joint effusion. IMPRESSION: No acute or traumatic finding.  Osteoarthritis as above. Electronically Signed   By: Nelson Chimes M.D.   On: 11/02/2016 12:53    ___No acute findings on x-ray of the left knee _________________________________________   PROCEDURES  Procedure(s) performed: None  Procedures  Critical Care performed:  No  ____________________________________________   INITIAL IMPRESSION / ASSESSMENT AND PLAN / ED COURSE  Pertinent labs & imaging results that were available during my care of the patient were reviewed by me and considered in my medical decision making (see chart for details).  Pain secondary to left knee contusion. Discussed x-ray findings with patient. Patient placed in a knee immobilizer given crutches to help ambulate. Patient advised to follow-up with PCP if no improvement in 3-5 days.      ____________________________________________   FINAL CLINICAL IMPRESSION(S) / ED DIAGNOSES  Final diagnoses:  Contusion of left knee, initial encounter      NEW MEDICATIONS STARTED DURING THIS VISIT:  New Prescriptions   HYDROCODONE-IBUPROFEN (VICOPROFEN) 7.5-200 MG TABLET    Take 1 tablet by mouth every 6 (six) hours as needed for moderate pain.     Note:  This document was prepared using Dragon voice recognition software and may include unintentional dictation errors.    Sable Feil, PA-C 11/02/16 1311    Lavonia Drafts, MD 11/02/16 1425

## 2016-11-02 NOTE — Discharge Instructions (Signed)
Knee immobilizer for 3-5 days as needed. Ambulate with crutches as directed.

## 2016-12-19 NOTE — Progress Notes (Deleted)
[place]  Telephone:(336) [phone]  Fax:(336) [phone]     Tonya Myers DOB: Sep 04, 1965  MR#: [id number]  VZC#:[id number]  Patient Care Team: Katheren Shams as PCP - General  CHIEF COMPLAINT:  CLL  INTERVAL HISTORY:   Patient returns to clinic today for repeat laboratory work and further evaluation. She continues to be highly anxious, but otherwise feels well and is asymptomatic. She does not complain of weakness or fatigue. She denies any fevers, night sweats, or weight loss. She continues to have shoulder pain which is unchanged.  She has no neurologic complaints. She has no chest pain or shortness of breath. She denies any nausea, vomiting, or diarrhea. She has no urinary complaints.  Patient offers no further specific complaints today.   REVIEW OF SYSTEMS:   Review of Systems  Constitutional: Negative for fever, malaise/fatigue and weight loss.  Respiratory: Negative.  Negative for cough and shortness of breath.   Cardiovascular: Negative.  Negative for chest pain.  Gastrointestinal: Negative for abdominal pain, constipation and nausea.  Genitourinary: Negative.   Musculoskeletal: Positive for joint pain.  Neurological: Negative.  Negative for weakness.  Psychiatric/Behavioral: Negative for depression. The patient is nervous/anxious.     As per HPI. Otherwise, a complete review of systems is negative.   PAST MEDICAL HISTORY: Past Medical History:  Diagnosis Date  . Acute hemorrhoid 03/11/2015  . Anxiety   . Arthritis   . Asthma   . Cancer ([place])   . Chronic back pain   . CLL (chronic lymphocytic leukemia) ([place])   . Depression   . Diabetes mellitus without complication ([place])   . Hypertension   . Hypothyroidism   . Vertigo     PAST SURGICAL HISTORY: Past Surgical History:  Procedure Laterality Date  . ABDOMINAL HYSTERECTOMY    . BREAST BIOPSY Right 05/17/2016   path pending  . COLONOSCOPY WITH PROPOFOL N/A 06/16/2015   Procedure:  COLONOSCOPY WITH PROPOFOL;  Surgeon: [name], MD;  Location: [facility] ENDOSCOPY;  Service: Endoscopy;  Laterality: N/A;    FAMILY HISTORY Family History  Problem Relation Age of Onset  . Cancer Paternal Aunt   . Breast cancer Maternal Aunt 60    GYNECOLOGIC HISTORY:  No LMP recorded. Patient has had a hysterectomy.     ADVANCED DIRECTIVES:    HEALTH MAINTENANCE: Social History  Substance Use Topics  . Smoking status: Never Smoker  . Smokeless tobacco: Never Used  . Alcohol use 0.6 oz/week    1 Cans of beer per week     No Known Allergies  Current Outpatient Prescriptions  Medication Sig Dispense Refill  . Blood Glucose Monitoring Suppl (FIFTY50 GLUCOSE METER 2.0) w/Device KIT     . cyclobenzaprine (FLEXERIL) 5 MG tablet Take by mouth.    . Dapagliflozin-Metformin HCl ER 08-998 MG TB24 Take by mouth.    . Diclofenac Sodium CR 100 MG 24 hr tablet Take by mouth.    . diltiazem (TIAZAC) 180 MG 24 hr capsule Take 180 mg by mouth daily.    . Dulaglutide (TRULICITY [place]) Inject 1 Dose into the skin once a week.    . ezetimibe (ZETIA) 10 MG tablet Take by mouth.    Marland Kitchen FARXIGA 10 MG TABS tablet     . furosemide (LASIX) 20 MG tablet Take by mouth. Reported on 07/29/2015    . gabapentin (NEURONTIN) 100 MG capsule Take 100 mg twice a day for one week, then increase to 200 mg(2 tablets) twice a day and continue    .  glucose blood (ONE TOUCH ULTRA TEST) test strip USE THREE TIMES A DAY. USE AS INSTRUCTED    . HYDROcodone-ibuprofen (VICOPROFEN) 7.5-200 MG tablet Take 1 tablet by mouth every 6 (six) hours as needed for moderate pain. 20 tablet 0  . Insulin Glargine (LANTUS) 100 UNIT/ML Solostar Pen Inject 20 Units into the skin at bedtime.    . Insulin Pen Needle (FIFTY50 PEN NEEDLES) 31G X 5 MM MISC Use once daily.    . irbesartan (AVAPRO) 300 MG tablet Take by mouth.    . levothyroxine (SYNTHROID, LEVOTHROID) 150 MCG tablet Take by mouth.    . lidocaine (LIDODERM) 5 % Place onto  the skin. Reported on 07/29/2015    . metroNIDAZOLE (FLAGYL) 500 MG tablet Take 2 tabs BID for 1 day 4 tablet 0  . ONETOUCH DELICA LANCETS 03O MISC USE THREE TIMES A DAY. USE AS INSTRUCTED    . rosuvastatin (CRESTOR) 10 MG tablet Take by mouth.    . valGANciclovir (VALCYTE) 450 MG tablet Take by mouth.     No current facility-administered medications for this visit.     OBJECTIVE: There were no vitals taken for this visit.   There is no height or weight on file to calculate BMI.    ECOG FS:1 - Symptomatic but completely ambulatory  General: Well-developed, well-nourished, no acute distress.  Eyes: Pink conjunctiva, anicteric sclera. Lungs: Clear to auscultation bilaterally. Heart: Regular rate and rhythm. No rubs, murmurs, or gallops. Abdomen: Soft, nontender, nondistended. No organomegaly noted, normoactive bowel sounds. Musculoskeletal: No edema, cyanosis, or clubbing. Neuro: Alert, answering all questions appropriately. Cranial nerves grossly intact. Skin: No rashes or petechiae noted. Psych: Normal affect. Lymphatics: No cervical, clavicular, axillary LAD.    LAB RESULTS:  No visits with results within 3 Day(s) from this visit.  Latest known visit with results is:  Office Visit on 11/01/2016  Component Date Value Ref Range Status  . Trichomonas, UA 11/01/2016 Negative   Final  . Clue Cells Wet Prep HPF POC 11/01/2016 neg   Final  . Yeast Wet Prep HPF POC 11/01/2016 neg   Final  . KOH Prep POC 11/01/2016 Negative  Negative Final    STUDIES: No results found.  ASSESSMENT: CLL  PLAN:   1. CLL: No evidence of disease. Patient's white blood cell count continues to be within normal limits.  CT scan results from April 13, 2016 were reviewed independently with no evidence of disease. Patient does not require additional treatment at this time. Patient has also requested no further CTs be done unless there is consideration of reinitiation of treatment, but she may agree to  yearly scans. If patient in fact does need to be retreated, her she will need to be heavily pretreated since patient had a reaction to Rituxan during cycle 1. The remainder of her infusions proceeded without problem. Return to clinic in 6 months with repeat laboratory work and further evaluation. 2. Anxiety: Improved. Patient will require Ativan with any future CT scan. 3. Hyperglycemia: Continue current diabetic medications. Treatment per PCP. 4. Anemia: Resolved. Patient had a colonoscopy in February 2017 that was reported as normal. 5. Abnormal mammogram: Biopsy negative for malignancy.  Patient expressed understanding and was in agreement with this plan. She also understands that She can call clinic at any time with any questions, concerns, or complaints.   Lloyd Huger, MD   12/19/2016 8:58 AM

## 2016-12-22 ENCOUNTER — Inpatient Hospital Stay: Payer: Medicare Other

## 2016-12-22 ENCOUNTER — Inpatient Hospital Stay: Payer: Medicare Other | Admitting: Oncology

## 2017-01-12 ENCOUNTER — Inpatient Hospital Stay: Payer: Medicare Other

## 2017-01-12 ENCOUNTER — Inpatient Hospital Stay: Payer: Medicare Other | Admitting: Oncology

## 2017-01-19 ENCOUNTER — Other Ambulatory Visit: Payer: Self-pay | Admitting: *Deleted

## 2017-01-19 DIAGNOSIS — C9110 Chronic lymphocytic leukemia of B-cell type not having achieved remission: Secondary | ICD-10-CM

## 2017-01-19 NOTE — Progress Notes (Signed)
[place]  Telephone:(336) [phone]  Fax:(336) [phone]     Tonya Myers DOB: [birth year]  MR#: [id number]  DJT#:[id number]  Patient Care Team: Katheren Shams as PCP - General  CHIEF COMPLAINT:  CLL  INTERVAL HISTORY:   Patient returns to clinic today for repeat laboratory work and further evaluation. She continues to be highly anxious, but otherwise feels well and is asymptomatic. She does not complain of weakness or fatigue. She denies any fevers, night sweats, or weight loss. She continues to have shoulder pain which is unchanged.  She has no neurologic complaints. She has no chest pain or shortness of breath. She denies any nausea, vomiting, or diarrhea. She has no urinary complaints.  Patient offers no further specific complaints today.   REVIEW OF SYSTEMS:   Review of Systems  Constitutional: Negative for fever, malaise/fatigue and weight loss.  Respiratory: Negative.  Negative for cough and shortness of breath.   Cardiovascular: Negative.  Negative for chest pain and leg swelling.  Gastrointestinal: Negative for abdominal pain, constipation and nausea.  Genitourinary: Negative.   Musculoskeletal: Positive for joint pain.  Skin: Negative.  Negative for rash.  Neurological: Negative.  Negative for sensory change and weakness.  Psychiatric/Behavioral: Negative for depression. The patient is nervous/anxious.     As per HPI. Otherwise, a complete review of systems is negative.   PAST MEDICAL HISTORY: Past Medical History:  Diagnosis Date  . Acute hemorrhoid 03/11/2015  . Anxiety   . Arthritis   . Asthma   . Cancer ([place])   . Chronic back pain   . CLL (chronic lymphocytic leukemia) ([place])   . Depression   . Diabetes mellitus without complication ([place])   . Hypertension   . Hypothyroidism   . Vertigo     PAST SURGICAL HISTORY: Past Surgical History:  Procedure Laterality Date  . ABDOMINAL HYSTERECTOMY    . BREAST BIOPSY Right 05/17/2016   path  pending  . COLONOSCOPY WITH PROPOFOL N/A 06/16/2015   Procedure: COLONOSCOPY WITH PROPOFOL;  Surgeon: [name], MD;  Location: [facility] ENDOSCOPY;  Service: Endoscopy;  Laterality: N/A;    FAMILY HISTORY Family History  Problem Relation Age of Onset  . Cancer Paternal Aunt   . Breast cancer Maternal Aunt 49    GYNECOLOGIC HISTORY:  No LMP recorded. Patient has had a hysterectomy.     ADVANCED DIRECTIVES:    HEALTH MAINTENANCE: Social History  Substance Use Topics  . Smoking status: Never Smoker  . Smokeless tobacco: Never Used  . Alcohol use 0.6 oz/week    1 Cans of beer per week     No Known Allergies  Current Outpatient Prescriptions  Medication Sig Dispense Refill  . albuterol (PROVENTIL HFA;VENTOLIN HFA) 108 (90 Base) MCG/ACT inhaler Inhale into the lungs.    . Blood Glucose Monitoring Suppl (FIFTY50 GLUCOSE METER 2.0) w/Device KIT     . cyclobenzaprine (FLEXERIL) 5 MG tablet Take by mouth.    . Dapagliflozin-Metformin HCl ER 08-998 MG TB24 Take by mouth.    . Diclofenac Sodium CR 100 MG 24 hr tablet Take by mouth.    . diltiazem (TIAZAC) 180 MG 24 hr capsule Take 180 mg by mouth daily.    . Dulaglutide (TRULICITY [place]) Inject 1 Dose into the skin once a week.    . ezetimibe (ZETIA) 10 MG tablet Take by mouth.    Marland Kitchen FARXIGA 10 MG TABS tablet     . furosemide (LASIX) 20 MG tablet Take by mouth. Reported on  07/29/2015    . gabapentin (NEURONTIN) 100 MG capsule Take 100 mg twice a day for one week, then increase to 200 mg(2 tablets) twice a day and continue    . glucose blood (ONE TOUCH ULTRA TEST) test strip USE THREE TIMES A DAY. USE AS INSTRUCTED    . HYDROcodone-ibuprofen (VICOPROFEN) 7.5-200 MG tablet Take 1 tablet by mouth every 6 (six) hours as needed for moderate pain. 20 tablet 0  . Insulin Glargine (LANTUS) 100 UNIT/ML Solostar Pen Inject 20 Units into the skin at bedtime.    . Insulin Pen Needle (FIFTY50 PEN NEEDLES) 31G X 5 MM MISC Use once daily.    .  irbesartan (AVAPRO) 300 MG tablet Take by mouth.    . levothyroxine (SYNTHROID, LEVOTHROID) 150 MCG tablet Take by mouth.    . lidocaine (LIDODERM) 5 % Place onto the skin. Reported on 07/29/2015    . meloxicam (MOBIC) 15 MG tablet Take by mouth.    . metroNIDAZOLE (FLAGYL) 500 MG tablet Take 2 tabs BID for 1 day 4 tablet 0  . ONETOUCH DELICA LANCETS 63K MISC USE THREE TIMES A DAY. USE AS INSTRUCTED    . rosuvastatin (CRESTOR) 10 MG tablet Take by mouth.    . valGANciclovir (VALCYTE) 450 MG tablet Take by mouth.     No current facility-administered medications for this visit.     OBJECTIVE: BP 113/74 (Patient Position: Sitting)   Pulse (!) 54   Temp 98 F (36.7 C) (Tympanic)   Wt 228 lb 11.2 oz (103.7 kg)   BMI 31.02 kg/m    Body mass index is 31.02 kg/m.    ECOG FS:1 - Symptomatic but completely ambulatory  General: Well-developed, well-nourished, no acute distress.  Eyes: Pink conjunctiva, anicteric sclera. Lungs: Clear to auscultation bilaterally. Heart: Regular rate and rhythm. No rubs, murmurs, or gallops. Abdomen: Soft, nontender, nondistended. No organomegaly noted, normoactive bowel sounds. Musculoskeletal: No edema, cyanosis, or clubbing. Neuro: Alert, answering all questions appropriately. Cranial nerves grossly intact. Skin: No rashes or petechiae noted. Psych: Normal affect. Lymphatics: No cervical, clavicular, axillary LAD.    LAB RESULTS:  Appointment on 01/20/2017  Component Date Value Ref Range Status  . LDH 01/20/2017 136  98 - 192 U/L Final  . Sodium 01/20/2017 140  135 - 145 mmol/L Final  . Potassium 01/20/2017 3.8  3.5 - 5.1 mmol/L Final  . Chloride 01/20/2017 104  101 - 111 mmol/L Final  . CO2 01/20/2017 27  22 - 32 mmol/L Final  . Glucose, Bld 01/20/2017 111* 65 - 99 mg/dL Final  . BUN 01/20/2017 16  6 - 20 mg/dL Final  . Creatinine, Ser 01/20/2017 0.84  0.44 - 1.00 mg/dL Final  . Calcium 01/20/2017 9.6  8.9 - 10.3 mg/dL Final  . Total Protein  01/20/2017 7.3  6.5 - 8.1 g/dL Final  . Albumin 01/20/2017 4.4  3.5 - 5.0 g/dL Final  . AST 01/20/2017 19  15 - 41 U/L Final  . ALT 01/20/2017 29  14 - 54 U/L Final  . Alkaline Phosphatase 01/20/2017 98  38 - 126 U/L Final  . Total Bilirubin 01/20/2017 0.6  0.3 - 1.2 mg/dL Final  . GFR calc non Af Amer 01/20/2017 >60  >60 mL/min Final  . GFR calc Af Amer 01/20/2017 >60  >60 mL/min Final   Comment: (NOTE) The eGFR has been calculated using the CKD EPI equation. This calculation has not been validated in all clinical situations. eGFR's persistently <60 mL/min signify possible Chronic  Kidney Disease.   . Anion gap 01/20/2017 9  5 - 15 Final  . WBC 01/20/2017 7.2  3.6 - 11.0 K/uL Final  . RBC 01/20/2017 5.19  3.80 - 5.20 MIL/uL Final  . Hemoglobin 01/20/2017 13.6  12.0 - 16.0 g/dL Final  . HCT 01/20/2017 41.0  35.0 - 47.0 % Final  . MCV 01/20/2017 78.9* 80.0 - 100.0 fL Final  . MCH 01/20/2017 26.2  26.0 - 34.0 pg Final  . MCHC 01/20/2017 33.2  32.0 - 36.0 g/dL Final  . RDW 01/20/2017 15.0* 11.5 - 14.5 % Final  . Platelets 01/20/2017 353  150 - 440 K/uL Final  . Neutrophils Relative % 01/20/2017 42  % Final  . Neutro Abs 01/20/2017 3.0  1.4 - 6.5 K/uL Final  . Lymphocytes Relative 01/20/2017 49  % Final  . Lymphs Abs 01/20/2017 3.6  1.0 - 3.6 K/uL Final  . Monocytes Relative 01/20/2017 6  % Final  . Monocytes Absolute 01/20/2017 0.4  0.2 - 0.9 K/uL Final  . Eosinophils Relative 01/20/2017 2  % Final  . Eosinophils Absolute 01/20/2017 0.1  0 - 0.7 K/uL Final  . Basophils Relative 01/20/2017 1  % Final  . Basophils Absolute 01/20/2017 0.0  0 - 0.1 K/uL Final    STUDIES: No results found.  ASSESSMENT: CLL  PLAN:   1. CLL: No evidence of disease. Patient's white blood cell count continues to be within normal limits.  CT scan results from April 13, 2016 were reviewed independently with no evidence of disease. Patient does not require additional treatment at this time. Patient has  also requested no further CTs be done unless there is consideration of reinitiation of treatment, but she may agree to yearly scans. If patient in fact does need to be retreated, her she will need to be heavily pretreated since patient had a previous reaction to Rituxan. Return to clinic in 6 months with repeat laboratory work and further evaluation. 2. Anxiety: Improved. Patient will require Ativan with any future CT scan. 3. Hyperglycemia: Continue current diabetic medications. Treatment per PCP. 4. Anemia: Resolved. Patient had a colonoscopy in February 2017 that was reported as normal. 5. Abnormal mammogram: Biopsy negative for malignancy.  Patient expressed understanding and was in agreement with this plan. She also understands that She can call clinic at any time with any questions, concerns, or complaints.   Lloyd Huger, MD   01/21/2017 4:07 PM

## 2017-01-20 ENCOUNTER — Inpatient Hospital Stay (HOSPITAL_BASED_OUTPATIENT_CLINIC_OR_DEPARTMENT_OTHER): Payer: Medicare Other | Admitting: Oncology

## 2017-01-20 ENCOUNTER — Inpatient Hospital Stay: Payer: Medicare Other | Attending: Oncology

## 2017-01-20 ENCOUNTER — Encounter: Payer: Self-pay | Admitting: Oncology

## 2017-01-20 VITALS — BP 113/74 | HR 54 | Temp 98.0°F | Wt 228.7 lb

## 2017-01-20 DIAGNOSIS — J45909 Unspecified asthma, uncomplicated: Secondary | ICD-10-CM | POA: Diagnosis not present

## 2017-01-20 DIAGNOSIS — F419 Anxiety disorder, unspecified: Secondary | ICD-10-CM | POA: Diagnosis not present

## 2017-01-20 DIAGNOSIS — G8929 Other chronic pain: Secondary | ICD-10-CM | POA: Diagnosis not present

## 2017-01-20 DIAGNOSIS — M129 Arthropathy, unspecified: Secondary | ICD-10-CM | POA: Diagnosis not present

## 2017-01-20 DIAGNOSIS — E039 Hypothyroidism, unspecified: Secondary | ICD-10-CM

## 2017-01-20 DIAGNOSIS — C9111 Chronic lymphocytic leukemia of B-cell type in remission: Secondary | ICD-10-CM | POA: Diagnosis not present

## 2017-01-20 DIAGNOSIS — Z794 Long term (current) use of insulin: Secondary | ICD-10-CM | POA: Diagnosis not present

## 2017-01-20 DIAGNOSIS — M549 Dorsalgia, unspecified: Secondary | ICD-10-CM | POA: Insufficient documentation

## 2017-01-20 DIAGNOSIS — I10 Essential (primary) hypertension: Secondary | ICD-10-CM | POA: Diagnosis not present

## 2017-01-20 DIAGNOSIS — E1165 Type 2 diabetes mellitus with hyperglycemia: Secondary | ICD-10-CM | POA: Insufficient documentation

## 2017-01-20 DIAGNOSIS — Z803 Family history of malignant neoplasm of breast: Secondary | ICD-10-CM | POA: Insufficient documentation

## 2017-01-20 DIAGNOSIS — F329 Major depressive disorder, single episode, unspecified: Secondary | ICD-10-CM

## 2017-01-20 DIAGNOSIS — Z79899 Other long term (current) drug therapy: Secondary | ICD-10-CM | POA: Diagnosis not present

## 2017-01-20 DIAGNOSIS — C9110 Chronic lymphocytic leukemia of B-cell type not having achieved remission: Secondary | ICD-10-CM

## 2017-01-20 LAB — CBC WITH DIFFERENTIAL/PLATELET
BASOS PCT: 1 %
Basophils Absolute: 0 10*3/uL (ref 0–0.1)
EOS ABS: 0.1 10*3/uL (ref 0–0.7)
Eosinophils Relative: 2 %
HEMATOCRIT: 41 % (ref 35.0–47.0)
HEMOGLOBIN: 13.6 g/dL (ref 12.0–16.0)
LYMPHS ABS: 3.6 10*3/uL (ref 1.0–3.6)
Lymphocytes Relative: 49 %
MCH: 26.2 pg (ref 26.0–34.0)
MCHC: 33.2 g/dL (ref 32.0–36.0)
MCV: 78.9 fL — ABNORMAL LOW (ref 80.0–100.0)
Monocytes Absolute: 0.4 10*3/uL (ref 0.2–0.9)
Monocytes Relative: 6 %
NEUTROS PCT: 42 %
Neutro Abs: 3 10*3/uL (ref 1.4–6.5)
Platelets: 353 10*3/uL (ref 150–440)
RBC: 5.19 MIL/uL (ref 3.80–5.20)
RDW: 15 % — ABNORMAL HIGH (ref 11.5–14.5)
WBC: 7.2 10*3/uL (ref 3.6–11.0)

## 2017-01-20 LAB — COMPREHENSIVE METABOLIC PANEL
ALT: 29 U/L (ref 14–54)
AST: 19 U/L (ref 15–41)
Albumin: 4.4 g/dL (ref 3.5–5.0)
Alkaline Phosphatase: 98 U/L (ref 38–126)
Anion gap: 9 (ref 5–15)
BUN: 16 mg/dL (ref 6–20)
CHLORIDE: 104 mmol/L (ref 101–111)
CO2: 27 mmol/L (ref 22–32)
CREATININE: 0.84 mg/dL (ref 0.44–1.00)
Calcium: 9.6 mg/dL (ref 8.9–10.3)
Glucose, Bld: 111 mg/dL — ABNORMAL HIGH (ref 65–99)
Potassium: 3.8 mmol/L (ref 3.5–5.1)
Sodium: 140 mmol/L (ref 135–145)
Total Bilirubin: 0.6 mg/dL (ref 0.3–1.2)
Total Protein: 7.3 g/dL (ref 6.5–8.1)

## 2017-01-20 LAB — LACTATE DEHYDROGENASE: LDH: 136 U/L (ref 98–192)

## 2017-03-02 ENCOUNTER — Ambulatory Visit: Payer: Self-pay

## 2017-03-02 ENCOUNTER — Other Ambulatory Visit: Payer: Self-pay | Admitting: Occupational Medicine

## 2017-03-02 DIAGNOSIS — Z0000 Encounter for general adult medical examination without abnormal findings: Secondary | ICD-10-CM

## 2017-03-02 IMAGING — DX DG CHEST 2V
2 series · 2 of 2 positions shown · non-contrast
Comparison: CT chest [DATE]

CLINICAL DATA: Physical exam.  1st responder WTC [DATE]

EXAM:
CHEST  2 VIEW

[chest pa]
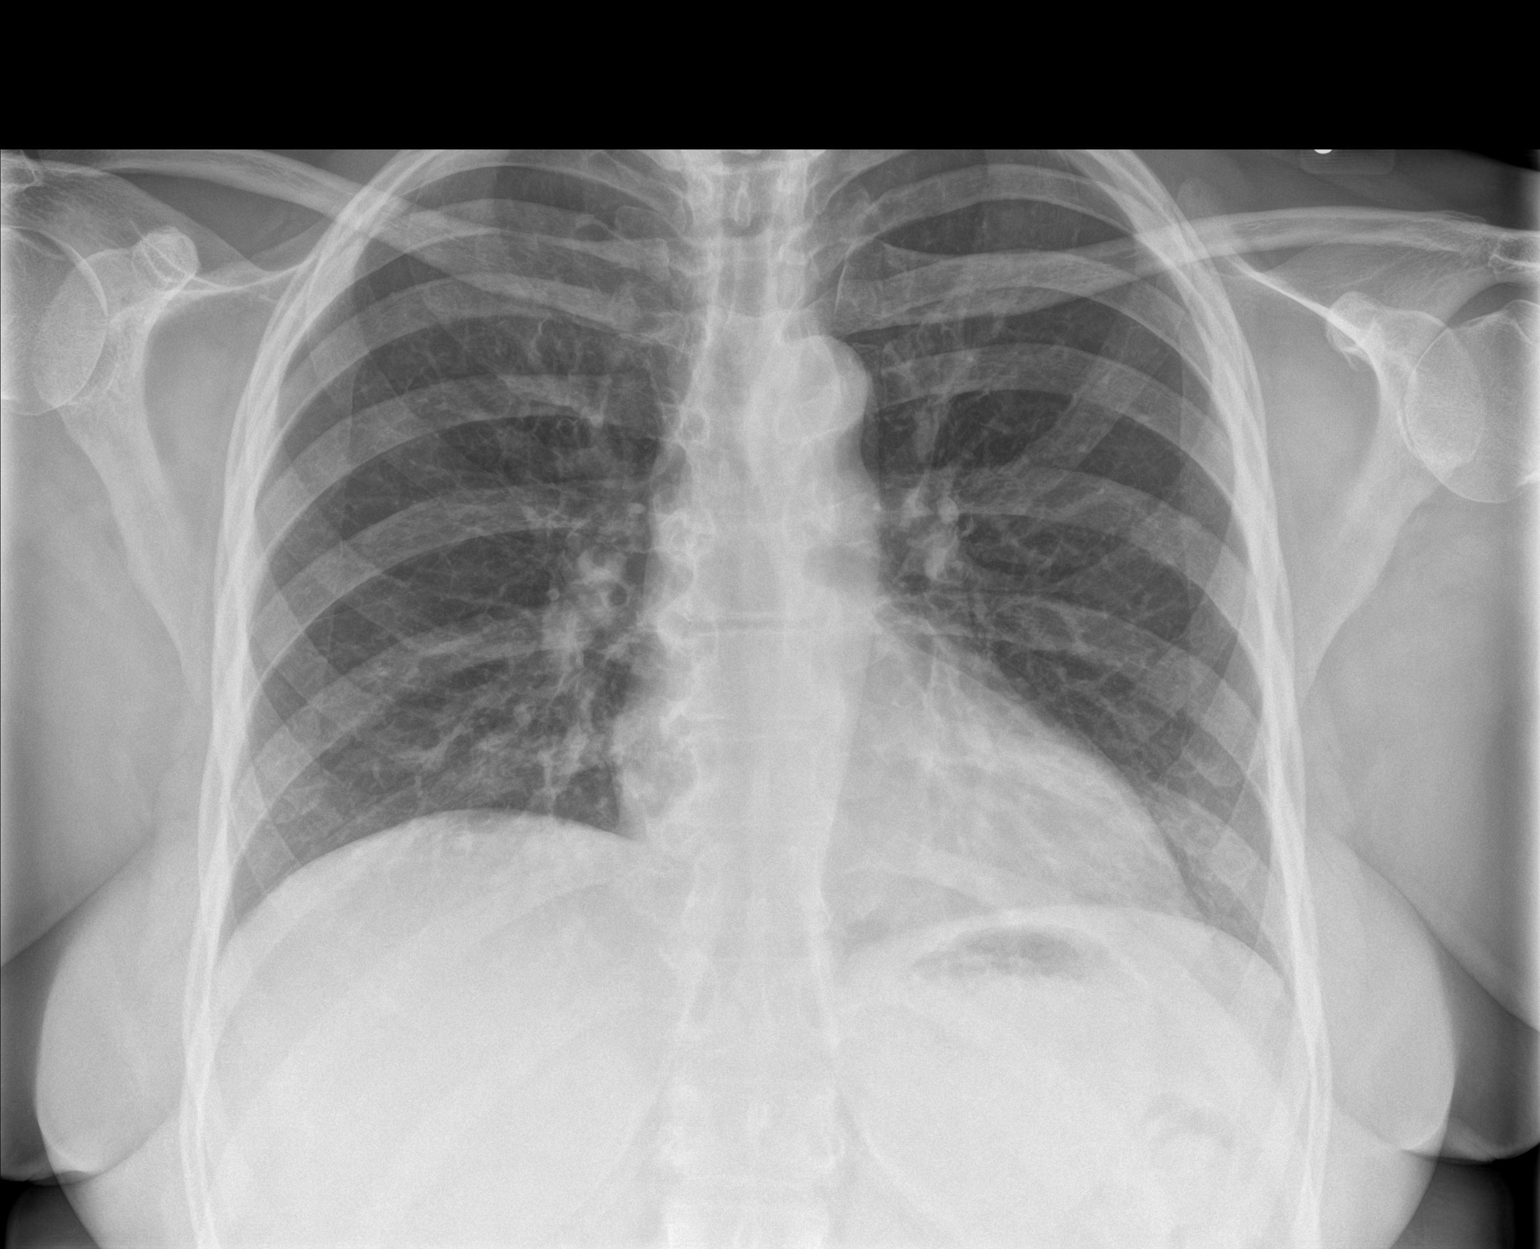

[chest lat]
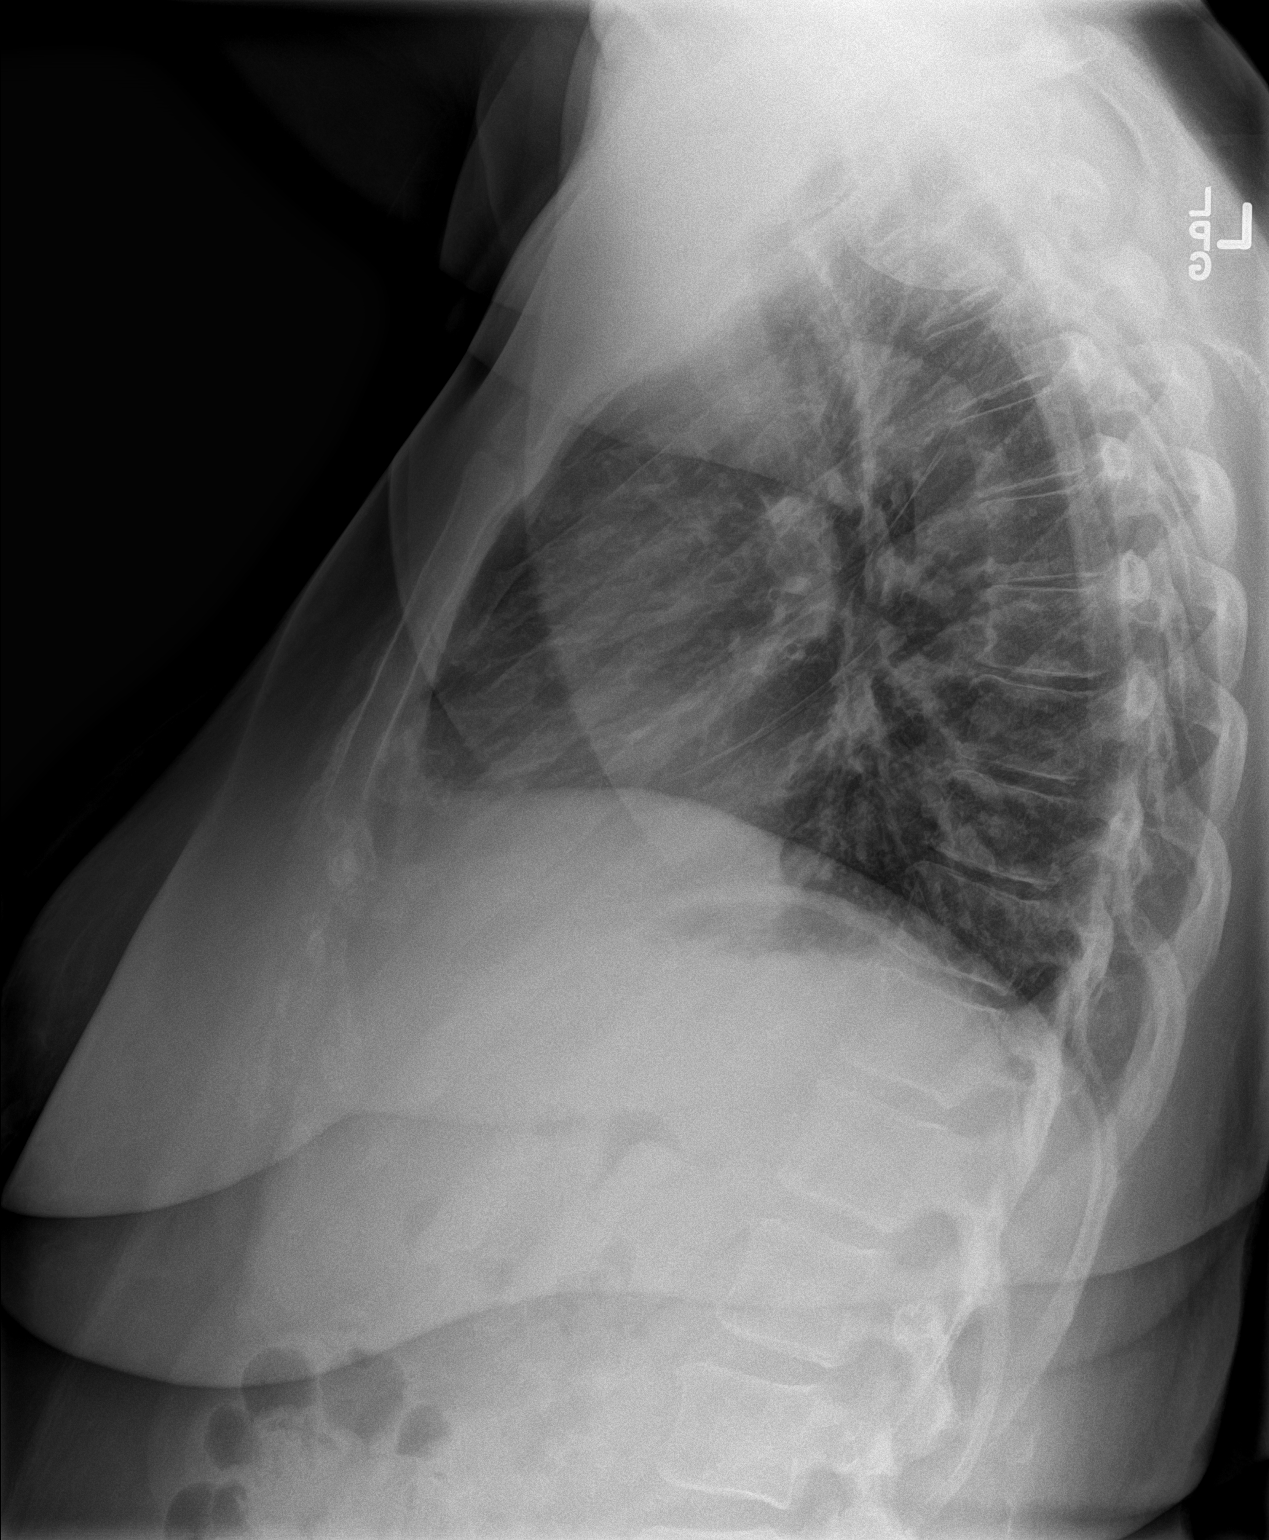

[2 of 2 positions shown; findings below may reference images not displayed]

FINDINGS: The heart size and mediastinal contours are within normal limits.
Both lungs are clear. The visualized skeletal structures are
unremarkable.
IMPRESSION: No active cardiopulmonary disease.

## 2017-06-02 ENCOUNTER — Other Ambulatory Visit: Payer: Self-pay | Admitting: Orthopedic Surgery

## 2017-06-02 DIAGNOSIS — M25619 Stiffness of unspecified shoulder, not elsewhere classified: Secondary | ICD-10-CM

## 2017-06-02 DIAGNOSIS — M1712 Unilateral primary osteoarthritis, left knee: Secondary | ICD-10-CM

## 2017-06-02 DIAGNOSIS — M25512 Pain in left shoulder: Secondary | ICD-10-CM

## 2017-07-17 NOTE — Progress Notes (Signed)
Tonya Myers  [phone]  [phone]     Tonya Myers DOB: May 08, 1965  MR#: [id number]  XKG#:[id number]  Patient Care Team: Katheren Shams as PCP - General  CHIEF COMPLAINT:  CLL  INTERVAL HISTORY:   Patient returns to clinic today for routine 71-monthevaluation. She continues to be highly anxious, but otherwise feels well and is asymptomatic. She does not complain of weakness or fatigue. She denies any fevers, night sweats, or weight loss. She has no neurologic complaints. She has no chest pain or shortness of breath. She denies any nausea, vomiting, or diarrhea. She has no urinary complaints.  Patient offers no specific complaints today.   REVIEW OF SYSTEMS:   Review of Systems  Constitutional: Negative for fever, malaise/fatigue and weight loss.  Respiratory: Negative.  Negative for cough and shortness of breath.   Cardiovascular: Negative.  Negative for chest pain and leg swelling.  Gastrointestinal: Negative for abdominal pain, constipation and nausea.  Genitourinary: Negative.   Musculoskeletal: Positive for joint pain.  Skin: Negative.  Negative for rash.  Neurological: Negative.  Negative for sensory change and weakness.  Psychiatric/Behavioral: Negative for depression. The patient is nervous/anxious.     As per HPI. Otherwise, a complete review of systems is negative.   PAST MEDICAL HISTORY: Past Medical History:  Diagnosis Date  . Acute hemorrhoid 03/11/2015  . Anxiety   . Arthritis   . Asthma   . Cancer ([place]   . Chronic back pain   . CLL (ch[place] lymphocytic leukemia) ([place]   . Depression   . Diabetes mellitus without complication ([place]   . Hypertension   . Hypothyroidism   . Vertigo     PAST SURGICAL HISTORY: Past Surgical History:  Procedure Laterality Date  . ABDOMINAL HYSTERECTOMY    . BREAST BIOPSY Right 05/17/2016   path pending  . COLONOSCOPY WITH PROPOFOL N/A 06/16/2015   Procedure: COLONOSCOPY WITH  PROPOFOL;  Surgeon: MJosefine Class MD;  [facility];  Service: Endoscopy;  Laterality: N/A;    FAMILY HISTORY Family History  Problem Relation Age of Onset  . Cancer Paternal Aunt   . Breast cancer Maternal Aunt 756   GYNECOLOGIC HISTORY:  No LMP recorded. Patient has had a hysterectomy.     ADVANCED DIRECTIVES:    HEALTH MAINTENANCE: Social History   Tobacco Use  . Smoking status: Never Smoker  . Smokeless tobacco: Never Used  Substance Use Topics  . Alcohol use: Yes    Alcohol/week: 0.6 oz    Types: 1 Cans of beer per week  . Drug use: No     No Known Allergies  Current Outpatient Medications  Medication Sig Dispense Refill  . albuterol (PROVENTIL HFA;VENTOLIN HFA) 108 (90 Base) MCG/ACT inhaler Inhale into the lungs.    . Blood Glucose Monitoring Suppl (FIFTY50 GLUCOSE METER 2.0) w/Device KIT     . cyclobenzaprine (FLEXERIL) 5 MG tablet Take by mouth.    . Dapagliflozin-Metformin HCl ER 08-998 MG TB24 Take by mouth.    . Diclofenac Sodium CR 100 MG 24 hr tablet Take by mouth.    . diltiazem (TIAZAC) 180 MG 24 hr capsule Take 180 mg by mouth daily.    . Dulaglutide (TRULICITY [place]) Inject 1 Dose into the skin once a week.    . ezetimibe (ZETIA) 10 MG tablet Take by mouth.    .Marland KitchenFARXIGA 10 MG TABS tablet     . furosemide (LASIX) 20 MG tablet Take by mouth. Reported  on 07/29/2015    . glucose blood (ONE TOUCH ULTRA TEST) test strip USE THREE TIMES A DAY. USE AS INSTRUCTED    . Insulin Glargine (LANTUS) 100 UNIT/ML Solostar Pen Inject 20 Units into the skin at bedtime.    Marland Kitchen levothyroxine (SYNTHROID, LEVOTHROID) 150 MCG tablet Take by mouth.    . montelukast (SINGULAIR) 10 MG tablet Take 10 mg by mouth at bedtime.    [name] DELICA LANCETS 07H MISC USE THREE TIMES A DAY. USE AS INSTRUCTED    . pregabalin (LYRICA) 75 MG capsule Take 75 mg by mouth 2 (two) times daily.    Marland Kitchen telmisartan (MICARDIS) 80 MG tablet Take 80 mg by mouth daily.    Marland Kitchen  HYDROcodone-ibuprofen (VICOPROFEN) 7.5-200 MG tablet Take 1 tablet by mouth every 6 (six) hours as needed for moderate pain. (Patient not taking: Reported on 07/21/2017) 20 tablet 0   No current facility-administered medications for this visit.     OBJECTIVE: BP 131/78   Pulse 70   Temp 97.6 F (36.4 C) (Tympanic)   Resp 18   Wt 236 lb 14.4 oz (107.5 kg)   BMI 32.13 kg/m    Body mass index is 32.13 kg/m.    ECOG FS:1 - Symptomatic but completely ambulatory  General: Well-developed, well-nourished, no acute distress.  Eyes: Pink conjunctiva, anicteric sclera. Lungs: Clear to auscultation bilaterally. Heart: Regular rate and rhythm. No rubs, murmurs, or gallops. Abdomen: Soft, nontender, nondistended. No organomegaly noted, normoactive bowel sounds. Musculoskeletal: No edema, cyanosis, or clubbing. Neuro: Alert, answering all questions appropriately. Cranial nerves grossly intact. Skin: No rashes or petechiae noted. Psych: Normal affect. Lymphatics: No cervical, clavicular, axillary LAD.    LAB RESULTS:  Appointment on 07/21/2017  Component Date Value Ref Range Status  . WBC 07/21/2017 6.6  3.6 - 11.0 K/uL Final  . RBC 07/21/2017 4.96  3.80 - 5.20 MIL/uL Final  . Hemoglobin 07/21/2017 13.0  12.0 - 16.0 g/dL Final  . HCT 07/21/2017 39.1  35.0 - 47.0 % Final  . MCV 07/21/2017 78.8* 80.0 - 100.0 fL Final  . MCH 07/21/2017 26.3  26.0 - 34.0 pg Final  . MCHC 07/21/2017 33.3  32.0 - 36.0 g/dL Final  . RDW 07/21/2017 16.0* 11.5 - 14.5 % Final  . Platelets 07/21/2017 290  150 - 440 K/uL Final  . Neutrophils Relative % 07/21/2017 45  % Final  . Neutro Abs 07/21/2017 3.0  1.4 - 6.5 K/uL Final  . Lymphocytes Relative 07/21/2017 47  % Final  . Lymphs Abs 07/21/2017 3.2  1.0 - 3.6 K/uL Final  . Monocytes Relative 07/21/2017 5  % Final  . Monocytes Absolute 07/21/2017 0.4  0.2 - 0.9 K/uL Final  . Eosinophils Relative 07/21/2017 2  % Final  . Eosinophils Absolute 07/21/2017 0.1  0 - 0.7  K/uL Final  . Basophils Relative 07/21/2017 1  % Final  . Basophils Absolute 07/21/2017 0.0  0 - 0.1 K/uL Final   Performed at [facility], [address], [place], [place] [address]  . LDH 07/21/2017 139  98 - 192 U/L Final   Performed at [facility], [address] [place], [place] [address]    STUDIES: No results found.  ASSESSMENT: CLL  PLAN:   1. CLL: No evidence of disease. Patient's white blood cell count continues to be within normal limits.  CT scan results from April 13, 2016 were reviewed independently with no evidence of disease. Patient does not require additional treatment at  this time. Patient has also requested no further CTs be done unless there is consideration of reinitiation of treatment. If patient in fact does need to be retreated, she will need to be heavily pretreated since patient had a previous reaction to Rituxan. Return to clinic in 6 months with repeat laboratory work and further evaluation. 2. Anxiety: Improved. Patient will require Ativan with any future CT scans. 3. Hyperglycemia: Continue current diabetic medications. Treatment per PCP. 4. Anemia: Resolved. Patient had a colonoscopy in February 2017 that was reported as normal. 5. Abnormal mammogram: Biopsy negative for malignancy.  Patient expressed understanding and was in agreement with this plan. She also understands that She can call clinic at any time with any questions, concerns, or complaints.   Lloyd Huger, MD   07/25/2017 10:31 AM

## 2017-07-21 ENCOUNTER — Inpatient Hospital Stay: Payer: Medicare Other | Attending: Oncology

## 2017-07-21 ENCOUNTER — Inpatient Hospital Stay (HOSPITAL_BASED_OUTPATIENT_CLINIC_OR_DEPARTMENT_OTHER): Payer: Medicare Other | Admitting: Oncology

## 2017-07-21 VITALS — BP 131/78 | HR 70 | Temp 97.6°F | Resp 18 | Wt 236.9 lb

## 2017-07-21 DIAGNOSIS — Z794 Long term (current) use of insulin: Secondary | ICD-10-CM | POA: Insufficient documentation

## 2017-07-21 DIAGNOSIS — Z7901 Long term (current) use of anticoagulants: Secondary | ICD-10-CM

## 2017-07-21 DIAGNOSIS — M549 Dorsalgia, unspecified: Secondary | ICD-10-CM | POA: Diagnosis not present

## 2017-07-21 DIAGNOSIS — F329 Major depressive disorder, single episode, unspecified: Secondary | ICD-10-CM | POA: Diagnosis not present

## 2017-07-21 DIAGNOSIS — Z79899 Other long term (current) drug therapy: Secondary | ICD-10-CM

## 2017-07-21 DIAGNOSIS — Z803 Family history of malignant neoplasm of breast: Secondary | ICD-10-CM | POA: Diagnosis not present

## 2017-07-21 DIAGNOSIS — E039 Hypothyroidism, unspecified: Secondary | ICD-10-CM | POA: Diagnosis not present

## 2017-07-21 DIAGNOSIS — M129 Arthropathy, unspecified: Secondary | ICD-10-CM | POA: Insufficient documentation

## 2017-07-21 DIAGNOSIS — C9190 Lymphoid leukemia, unspecified not having achieved remission: Secondary | ICD-10-CM

## 2017-07-21 DIAGNOSIS — J45909 Unspecified asthma, uncomplicated: Secondary | ICD-10-CM

## 2017-07-21 DIAGNOSIS — E1165 Type 2 diabetes mellitus with hyperglycemia: Secondary | ICD-10-CM

## 2017-07-21 DIAGNOSIS — F419 Anxiety disorder, unspecified: Secondary | ICD-10-CM

## 2017-07-21 DIAGNOSIS — I10 Essential (primary) hypertension: Secondary | ICD-10-CM | POA: Insufficient documentation

## 2017-07-21 DIAGNOSIS — C9110 Chronic lymphocytic leukemia of B-cell type not having achieved remission: Secondary | ICD-10-CM

## 2017-07-21 LAB — CBC WITH DIFFERENTIAL/PLATELET
BASOS ABS: 0 10*3/uL (ref 0–0.1)
Basophils Relative: 1 %
Eosinophils Absolute: 0.1 10*3/uL (ref 0–0.7)
Eosinophils Relative: 2 %
HEMATOCRIT: 39.1 % (ref 35.0–47.0)
HEMOGLOBIN: 13 g/dL (ref 12.0–16.0)
LYMPHS ABS: 3.2 10*3/uL (ref 1.0–3.6)
LYMPHS PCT: 47 %
MCH: 26.3 pg (ref 26.0–34.0)
MCHC: 33.3 g/dL (ref 32.0–36.0)
MCV: 78.8 fL — AB (ref 80.0–100.0)
Monocytes Absolute: 0.4 10*3/uL (ref 0.2–0.9)
Monocytes Relative: 5 %
NEUTROS ABS: 3 10*3/uL (ref 1.4–6.5)
NEUTROS PCT: 45 %
Platelets: 290 10*3/uL (ref 150–440)
RBC: 4.96 MIL/uL (ref 3.80–5.20)
RDW: 16 % — ABNORMAL HIGH (ref 11.5–14.5)
WBC: 6.6 10*3/uL (ref 3.6–11.0)

## 2017-07-21 LAB — LACTATE DEHYDROGENASE: LDH: 139 U/L (ref 98–192)

## 2017-07-21 NOTE — Progress Notes (Signed)
Patient denies any concerns today.  

## 2017-10-03 ENCOUNTER — Ambulatory Visit (INDEPENDENT_AMBULATORY_CARE_PROVIDER_SITE_OTHER): Payer: Medicare Other | Admitting: Obstetrics and Gynecology

## 2017-10-03 ENCOUNTER — Encounter: Payer: Self-pay | Admitting: Obstetrics and Gynecology

## 2017-10-03 VITALS — BP 144/88 | HR 84 | Ht 72.0 in | Wt 233.0 lb

## 2017-10-03 DIAGNOSIS — N898 Other specified noninflammatory disorders of vagina: Secondary | ICD-10-CM

## 2017-10-03 LAB — POCT WET PREP WITH KOH
Clue Cells Wet Prep HPF POC: NEGATIVE
KOH PREP POC: NEGATIVE
Trichomonas, UA: NEGATIVE
YEAST WET PREP PER HPF POC: NEGATIVE

## 2017-10-03 NOTE — Patient Instructions (Signed)
I value your feedback and entrusting us with your care. If you get a [facility] patient survey, I would appreciate you taking the time to let us know about your experience today. Thank you! 

## 2017-10-03 NOTE — Progress Notes (Signed)
[facility]  Patient presents with  . Vaginitis    Poss yeast, itching,burning, some blood on saturday x3 days. Previously took antibotic     HPI:      [name] is a 52 y.o. No obstetric history on file. who LMP was No LMP recorded. Patient has had a hysterectomy., presents today for vaginal itching/burning for the past 3 days. Vag tissue felt dry after using Dial soap while out of town. Pt wiped after urination and then started to have itching/burning. Noticed a small amt of blood with wiping. No increased d/c, odor. 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 Pt had pneumonia last wk and is on abx. Hx of BV last yr.    Past Medical History:  Diagnosis Date  . Acute hemorrhoid 03/11/2015  . Anxiety   . Arthritis   . Asthma   . Cancer ([place])   . Chronic back pain   . CLL (chronic lymphocytic leukemia) ([place])   . Depression   . Diabetes mellitus without complication ([place])   . Hypertension   . Hypothyroidism   . Vertigo     Past Surgical History:  Procedure Laterality Date  . ABDOMINAL HYSTERECTOMY    . BREAST BIOPSY Right 05/17/2016   path pending  . COLONOSCOPY WITH PROPOFOL N/A 06/16/2015   Procedure: COLONOSCOPY WITH PROPOFOL;  Surgeon: [name], MD;  [facility];  Service: Endoscopy;  Laterality: N/A;    Family History  Problem Relation Age of Onset  . Cancer Paternal Aunt   . Breast cancer Maternal Aunt 70    Social History   Socioeconomic History  . Marital status: Single    Spouse name: Not on file  . Number of children: Not on file  . Years of education: Not on file  . Highest education level: Not on file  Occupational History  . Not on file  Social Needs  . Financial resource strain: Not on file  . Food insecurity:    Worry: Not on file    Inability: Not on file  . Transportation needs:    Medical: Not on file    Non-medical: Not on file  Tobacco Use  .  Smoking status: Never Smoker  . Smokeless tobacco: Never Used  Substance and Sexual Activity  . Alcohol use: Yes    Alcohol/week: 0.6 oz    Types: 1 Cans of beer per week  . Drug use: No  . Sexual activity: Not Currently    Birth control/protection: None  Lifestyle  . Physical activity:    Days per week: Not on file    Minutes per session: Not on file  . Stress: Not on file  Relationships  . Social connections:    Talks on phone: Not on file    Gets together: Not on file    Attends religious service: Not on file    Active member of club or organization: Not on file    Attends meetings of clubs or organizations: Not on file    Relationship status: Not on file  . Intimate partner violence:    Fear of current or ex partner: Not on file    Emotionally abused: Not on file    Physically abused: Not on file    Forced sexual activity: Not on file  Other Topics Concern  . Not on file  Social History Narrative  . Not on file  Outpatient Medications Prior to Visit  Medication Sig Dispense Refill  . Blood Glucose Monitoring Suppl (FIFTY50 GLUCOSE METER 2.0) w/Device KIT     . Dapagliflozin-Metformin HCl ER 08-998 MG TB24 Take by mouth.    . Diclofenac Sodium CR 100 MG 24 hr tablet Take by mouth.    . diltiazem (DILACOR XR) 120 MG 24 hr capsule Take by mouth.    . diltiazem (TIAZAC) 180 MG 24 hr capsule Take 180 mg by mouth daily.    . Dulaglutide (TRULICITY [place]) Inject 1 Dose into the skin once a week.    . ezetimibe (ZETIA) 10 MG tablet Take by mouth.    . furosemide (LASIX) 20 MG tablet Take by mouth. Reported on 07/29/2015    . glucose blood (ONE TOUCH ULTRA TEST) test strip USE THREE TIMES A DAY. USE AS INSTRUCTED    . Insulin Glargine (LANTUS) 100 UNIT/ML Solostar Pen Inject 20 Units into the skin at bedtime.    . Lancets Misc. (UNISTIK 2 NORMAL) MISC 1 each.    . levothyroxine (SYNTHROID, LEVOTHROID) 150 MCG tablet Take by mouth.    . metFORMIN (GLUCOPHAGE) 1000 MG tablet Take  by mouth.    . montelukast (SINGULAIR) 10 MG tablet Take 10 mg by mouth at bedtime.    Tonya Myers DELICA LANCETS 09O MISC USE THREE TIMES A DAY. USE AS INSTRUCTED    . oxybutynin (DITROPAN) 5 MG tablet Take by mouth.    . pregabalin (LYRICA) 75 MG capsule Take 75 mg by mouth 2 (two) times daily.    . rosuvastatin (CRESTOR) 10 MG tablet TAKE 1 TABLET DAILY    . telmisartan (MICARDIS) 80 MG tablet Take 80 mg by mouth daily.    . valGANciclovir (VALCYTE) 450 MG tablet   0  . albuterol (PROVENTIL HFA;VENTOLIN HFA) 108 (90 Base) MCG/ACT inhaler Inhale into the lungs.    . cyclobenzaprine (FLEXERIL) 5 MG tablet Take by mouth.    Marland Kitchen FARXIGA 10 MG TABS tablet     . HYDROcodone-ibuprofen (VICOPROFEN) 7.5-200 MG tablet Take 1 tablet by mouth every 6 (six) hours as needed for moderate pain. (Patient not taking: Reported on 07/21/2017) 20 tablet 0   No facility-administered medications prior to visit.     ROS:  Review of Systems  Constitutional: Negative for fever.  Gastrointestinal: Negative for blood in stool, constipation, diarrhea, nausea and vomiting.  Genitourinary: Positive for vaginal pain. Negative for dyspareunia, dysuria, flank pain, frequency, hematuria, urgency, vaginal bleeding and vaginal discharge.  Musculoskeletal: Negative for back pain.  Skin: Negative for rash.   BREAST: No symptoms   OBJECTIVE:   Vitals:  BP (!) 144/88   Pulse 84   Ht 6' (1.829 m)   Wt 233 lb (105.7 kg)   BMI 31.60 kg/m   Physical Exam  Constitutional: She is oriented to person, place, and time. Vital signs are normal. She appears well-developed.  Pulmonary/Chest: Effort normal.  Genitourinary: Vagina normal and uterus normal.    There is tenderness and lesion on the right labia. There is no rash on the right labia. There is no rash, tenderness or lesion on the left labia. Uterus is not enlarged and not tender. Cervix exhibits no motion tenderness. Right adnexum displays no mass and no tenderness.  Left adnexum displays no mass and no tenderness. No erythema or tenderness in the vagina. No vaginal discharge found.  Genitourinary Comments: SMALL TEAR BETWEEN LABIA MAJORA/MINORA; NO ULCERATIVE LESIONS; FAINT SCAB PRESENT  Musculoskeletal: Normal range of motion.  Neurological: She is alert and oriented to person, place, and time.  Psychiatric: She has a normal mood and affect. Her behavior is normal. Thought content normal.  Vitals reviewed.   Results: Results for orders placed or performed in visit on 10/03/17 (from the past 24 hour(s))  POCT Wet Prep with KOH     Status: Normal   Collection Time: 10/03/17  2:34 PM  Result Value Ref Range   Trichomonas, UA Negative    Clue Cells Wet Prep HPF POC neg    Epithelial Wet Prep HPF POC  Few, Moderate, Many, Too numerous to count   Yeast Wet Prep HPF POC neg    Bacteria Wet Prep HPF POC  Few   RBC Wet Prep HPF POC     WBC Wet Prep HPF POC     KOH Prep POC Negative Negative     Assessment/Plan: Vaginal lesion - RT side. Looks like tear, probably from wiping too vigorously. Suggested she "blot" not wipe. Resume hypoallergenic soap/coconut oil/neosporin prn lesion.  Vaginal itching - Neg wet prep. Reassurance.  - Plan: POCT Wet Prep with KOH    Return if symptoms worsen or fail to improve.  Linda Biehn B. Shronda Boeh, PA-C 10/03/2017 2:36 PM

## 2017-10-11 ENCOUNTER — Telehealth: Payer: Self-pay

## 2017-10-11 DIAGNOSIS — N898 Other specified noninflammatory disorders of vagina: Secondary | ICD-10-CM

## 2017-10-11 NOTE — Telephone Encounter (Signed)
Pt would like to speak to [facility] regarding visit last week. [phone]

## 2017-10-11 NOTE — Telephone Encounter (Signed)
Pt wants to know if you can send in a diflucan, 3 days ago she used some otc yeast medication and she can not see any improvement, please advise

## 2017-10-11 NOTE — Telephone Encounter (Signed)
Area should have healed by now. Check labs for HSV since still persisting. Pt to RTO for labs. Try OTC hydrocortisone crm ext BID in meantime.

## 2017-10-12 NOTE — Telephone Encounter (Signed)
Pt aware.

## 2017-10-13 ENCOUNTER — Other Ambulatory Visit: Payer: Medicare Other

## 2017-10-13 ENCOUNTER — Encounter: Payer: Self-pay | Admitting: Obstetrics and Gynecology

## 2017-10-13 MED ORDER — FLUCONAZOLE 150 MG PO TABS: 150.0000 mg | ORAL_TABLET | Freq: Once | ORAL | 0 refills | Status: AC

## 2017-10-13 NOTE — Addendum Note (Signed)
Addended by: Ardeth Perfect B on: 9/84/7308 09:02 AM   Modules accepted: Orders

## 2017-10-13 NOTE — Telephone Encounter (Signed)
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 Pt states vag lesion resolved. She developed severe itching/increased d/c a few days after I saw her last wk. She had neg wet prep 10/03/17. Infectious disease MD told her it was yeast. Pt treated with OTC yeast meds but still sx. Pt wants diflucan for sx. Pt is s/p recent abx use. Rx diflucan eRxd. F/u prn.

## 2017-11-17 ENCOUNTER — Other Ambulatory Visit: Payer: Self-pay | Admitting: Family Medicine

## 2017-11-17 DIAGNOSIS — Z1231 Encounter for screening mammogram for malignant neoplasm of breast: Secondary | ICD-10-CM

## 2017-12-06 ENCOUNTER — Ambulatory Visit
Admission: RE | Admit: 2017-12-06 | Discharge: 2017-12-06 | Disposition: A | Discharge status: Home / Self Care | Payer: Medicare Other | Source: Ambulatory Visit | Attending: Family Medicine | Admitting: Family Medicine

## 2017-12-06 ENCOUNTER — Encounter: Payer: Self-pay | Admitting: Obstetrics and Gynecology

## 2017-12-06 DIAGNOSIS — Z1231 Encounter for screening mammogram for malignant neoplasm of breast: Secondary | ICD-10-CM | POA: Diagnosis not present

## 2017-12-06 IMAGING — MG MM DIGITAL SCREENING BILAT W/ TOMO W/ CAD
6 of 10 series · 6 of 30 positions shown · non-contrast
Comparison: Previous exam(s).

CLINICAL DATA: Screening.

EXAM:
DIGITAL SCREENING BILATERAL MAMMOGRAM WITH TOMO AND CAD

[L CC synth-2D]
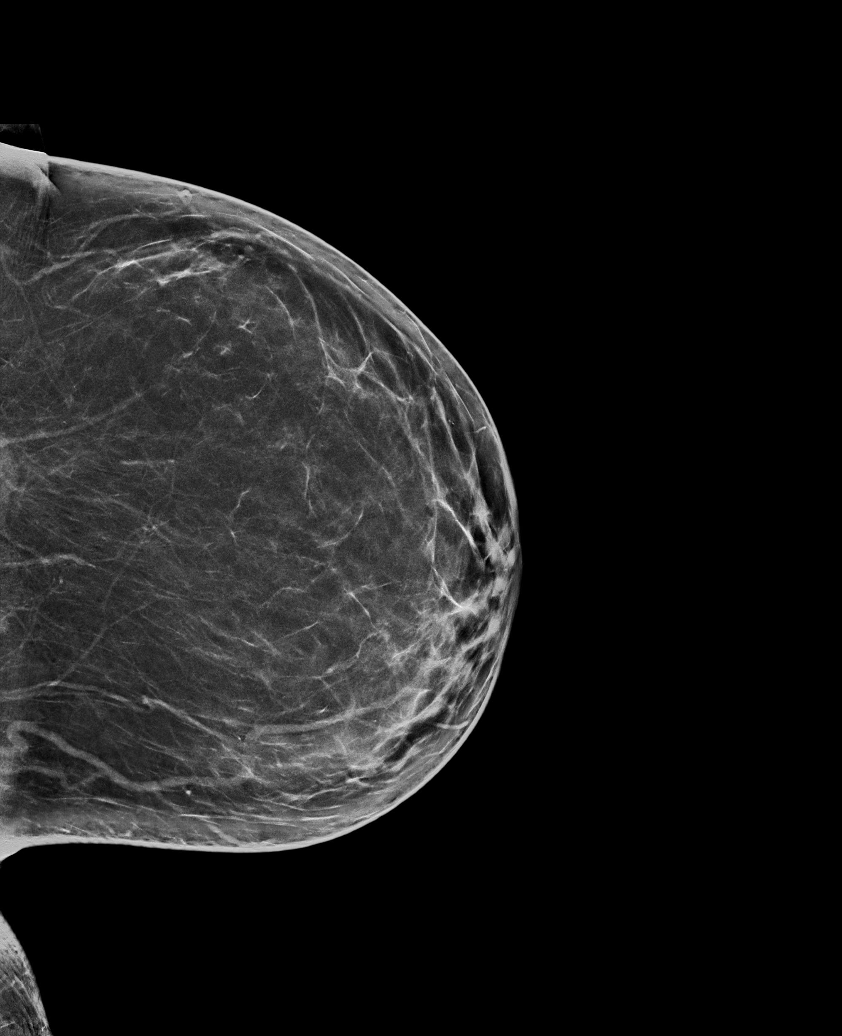

[R CC synth-2D]
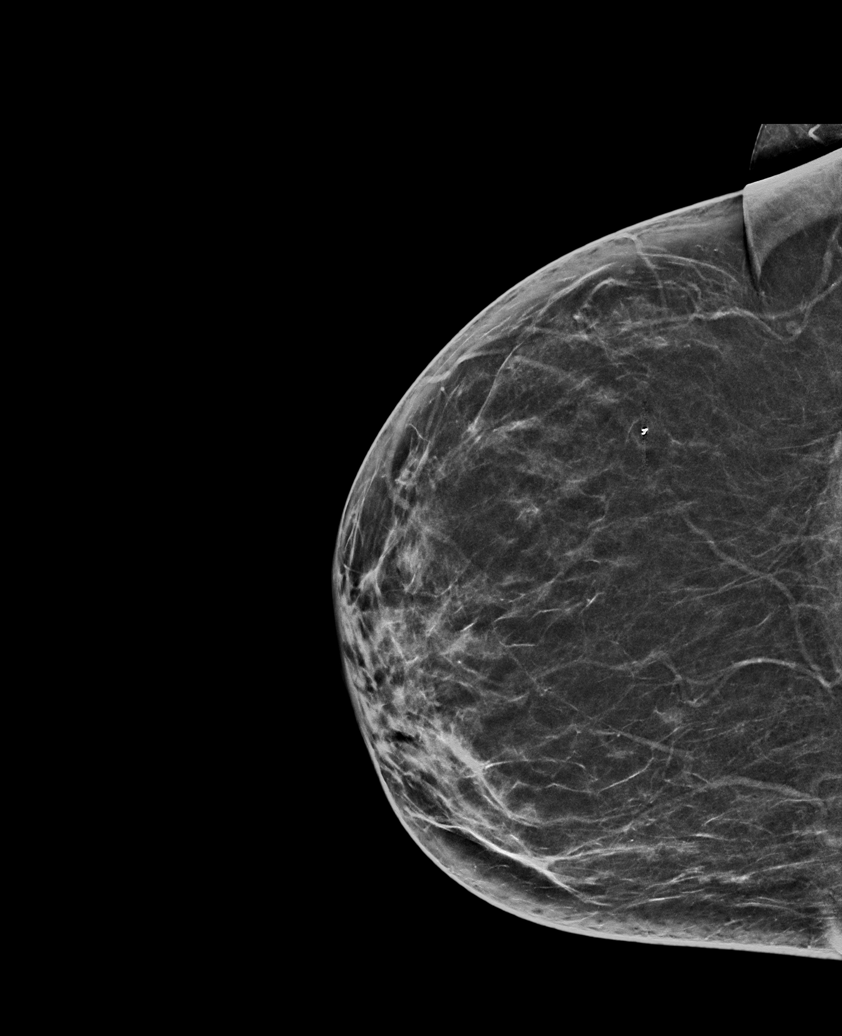

[L MLO synth-2D]
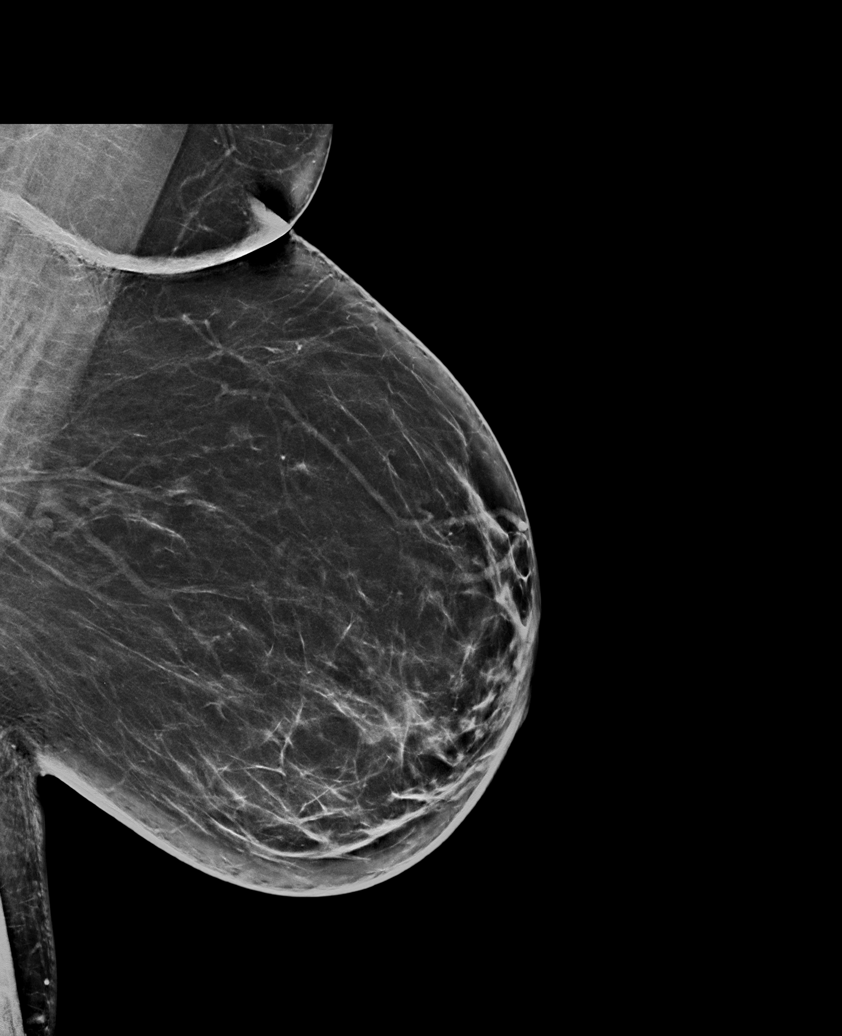

[R MLO synth-2D (1 of 2)]
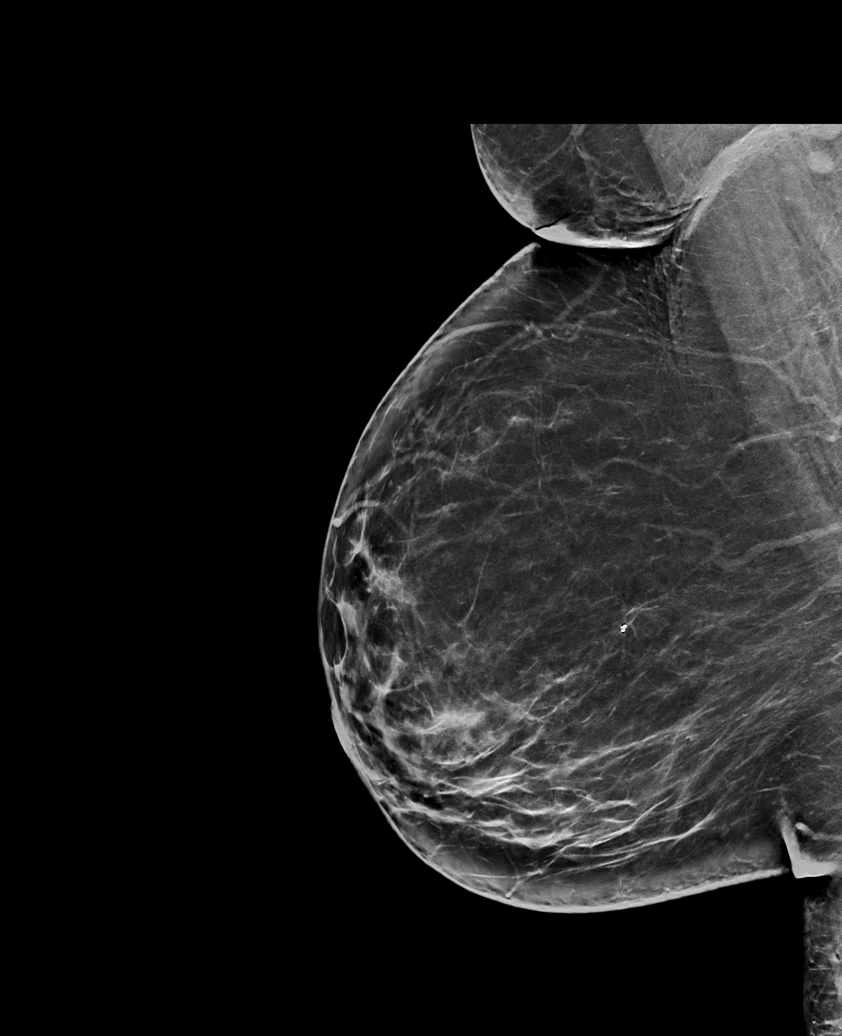

[R MLO synth-2D (2 of 2)]
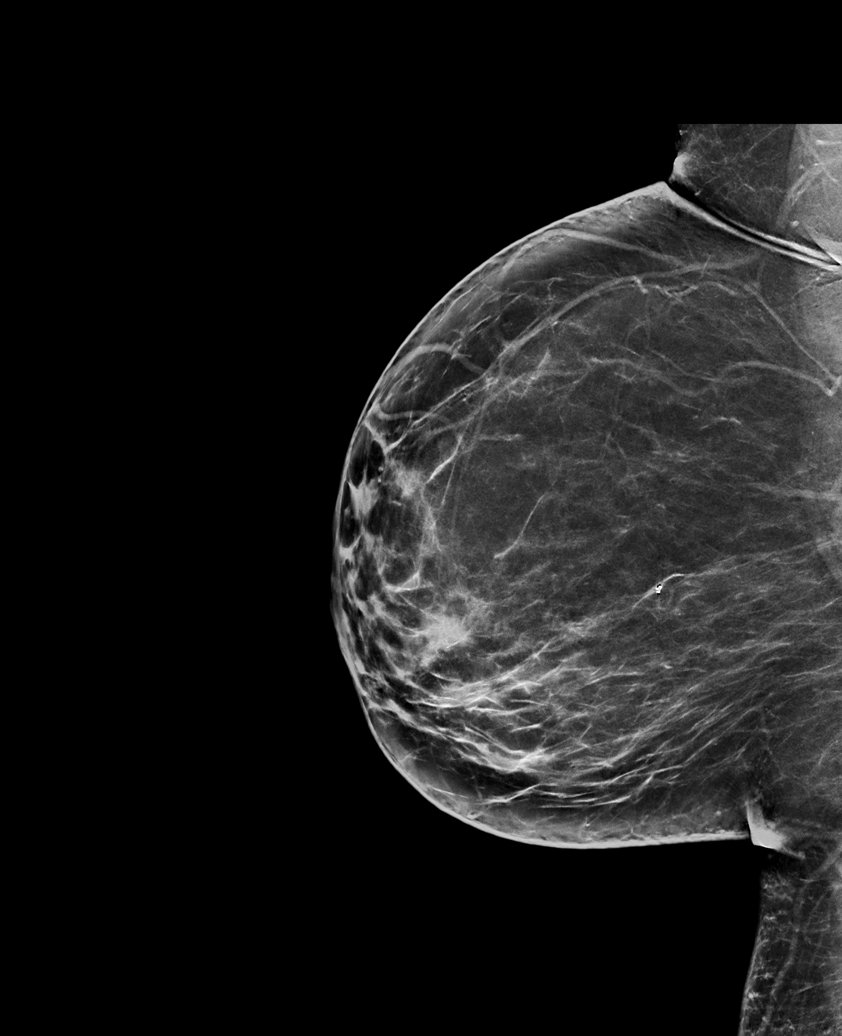

[R MLO tomo · tomo slice 40/79.0]
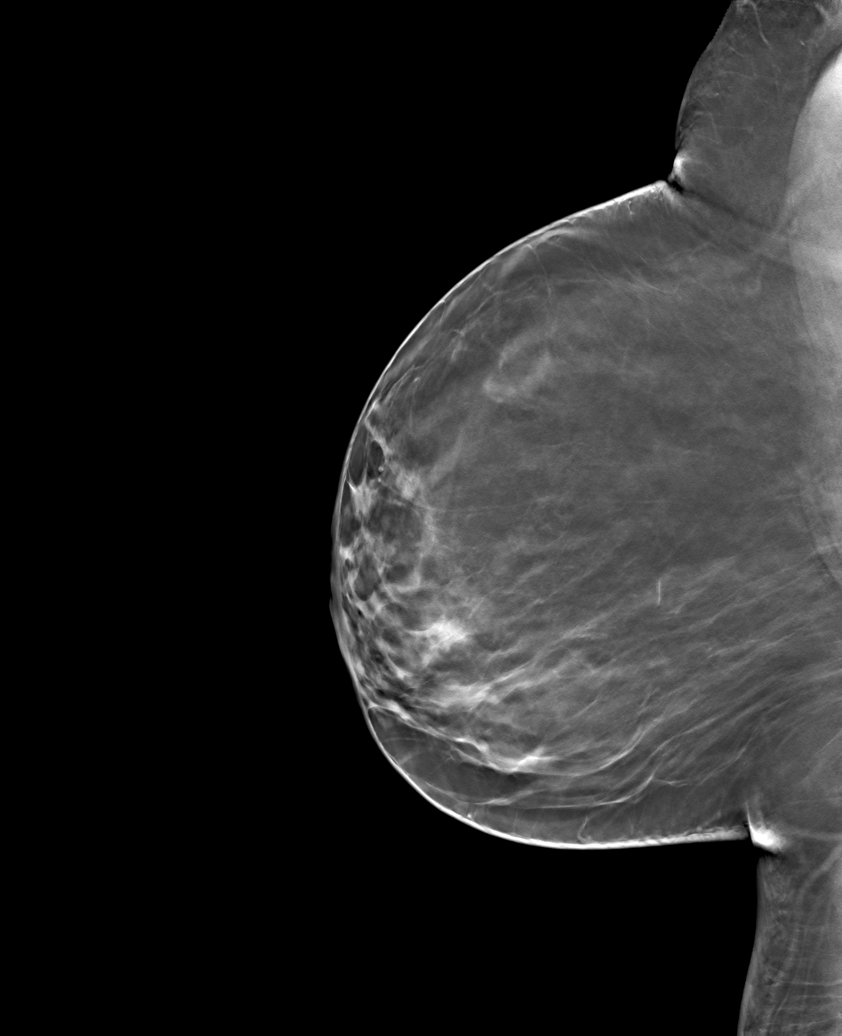

[6 of 30 positions shown; findings below may reference images not displayed]

ACR Breast Density Category b: There are scattered areas of
fibroglandular density.
FINDINGS: There are no findings suspicious for malignancy. Images were
processed with CAD.
IMPRESSION: No mammographic evidence of malignancy. A result letter of this
screening mammogram will be mailed directly to the patient.

RECOMMENDATION:
Screening mammogram in one year. (Code:[TQ])

BI-RADS CATEGORY  1: Negative.

## 2018-01-30 NOTE — Progress Notes (Signed)
Tonya Myers  [phone]  [phone]     Tonya Myers DOB: 04-Sep-1965  MR#: [id number]  YQM#:[id number]  Patient Care Team: Amalia Greenhouse as PCP - General (Obstetrics and Gynecology)  CHIEF COMPLAINT:  CLL  INTERVAL HISTORY:   Patient returns to clinic today for routine six-month evaluation.  She continues to be anxious, but otherwise feels well.  She does not complain of weakness or fatigue. She denies any fevers, night sweats, or weight loss. She has no neurologic complaints. She has no chest pain or shortness of breath. She denies any nausea, vomiting, or diarrhea. She has no urinary complaints.  Patient feels at her baseline offers no specific complaints today.  REVIEW OF SYSTEMS:   Review of Systems  Constitutional: Negative for fever, malaise/fatigue and weight loss.  Respiratory: Negative.  Negative for cough and shortness of breath.   Cardiovascular: Negative.  Negative for chest pain and leg swelling.  Gastrointestinal: Negative.  Negative for abdominal pain, constipation and nausea.  Genitourinary: Negative.  Negative for dysuria.  Musculoskeletal: Positive for joint pain.  Skin: Negative.  Negative for rash.  Neurological: Negative.  Negative for dizziness, sensory change, weakness and headaches.  Psychiatric/Behavioral: Negative for depression. The patient is nervous/anxious.     As per HPI. Otherwise, a complete review of systems is negative.   PAST MEDICAL HISTORY: Past Medical History:  Diagnosis Date  . Acute hemorrhoid 03/11/2015  . Anxiety   . Arthritis   . Asthma   . Cancer ([place])   . Chronic back pain   . CLL (chronic lymphocytic leukemia) ([place])   . Depression   . Diabetes mellitus without complication ([place])   . Genital herpes    type 2  . Hypertension   . Hypothyroidism   . Vertigo     PAST SURGICAL HISTORY: Past Surgical History:  Procedure Laterality Date  . ABDOMINAL HYSTERECTOMY    . BREAST  BIOPSY Right 05/17/2016   FIBROADENOMATOUS CHANGE AND SCLEROSING ADENOSIS WITH  . COLONOSCOPY WITH PROPOFOL N/A 06/16/2015   Procedure: COLONOSCOPY WITH PROPOFOL;  Surgeon: [name], MD;  Location[facility] ENDOSCOPY;  Service: Endoscopy;  Laterality: N/A;    FAMILY HISTORY Family History  Problem Relation Age of Onset  . Cancer Paternal Aunt   . Breast cancer Maternal Aunt 31    GYNECOLOGIC HISTORY:  No LMP recorded. Patient has had a hysterectomy.     ADVANCED DIRECTIVES:    HEALTH MAINTENANCE: Social History   Tobacco Use  . Smoking status: Never Smoker  . Smokeless tobacco: Never Used  Substance Use Topics  . Alcohol use: Yes    Alcohol/week: 1.0 standard drinks    Types: 1 Cans of beer per week  . Drug use: No     No Known Allergies  Current Outpatient Medications  Medication Sig Dispense Refill  . Dapagliflozin-Metformin HCl ER 08-998 MG TB24 Take by mouth.     . Diclofenac Sodium CR 100 MG 24 hr tablet Take by mouth.    . diltiazem (DILACOR XR) 120 MG 24 hr capsule Take by mouth.    . Dulaglutide (TRULICITY [place]) Inject 1 Dose into the skin once a week.    . ezetimibe (ZETIA) 10 MG tablet Take 10 mg by mouth daily.     . Fluticasone-Salmeterol (ADVAIR) 250-50 MCG/DOSE AEPB Inhale 1 puff into the lungs.     . furosemide (LASIX) 20 MG tablet Take by mouth. Reported on 07/29/2015    . glucose blood (  ONE TOUCH ULTRA TEST) test strip USE THREE TIMES A DAY. USE AS INSTRUCTED    . Insulin Glargine (LANTUS) 100 UNIT/ML Solostar Pen Inject 30 Units into the skin at bedtime.     . Lancets Misc. (UNISTIK 2 NORMAL) MISC 1 each.    . levothyroxine (SYNTHROID, LEVOTHROID) 150 MCG tablet Take 150 mcg by mouth daily before breakfast.     . montelukast (SINGULAIR) 10 MG tablet Take 10 mg by mouth at bedtime.    [name] DELICA LANCETS 19J MISC USE THREE TIMES A DAY. USE AS INSTRUCTED    . pregabalin (LYRICA) 75 MG capsule Take 75 mg by mouth 2 (two) times daily.    .  rosuvastatin (CRESTOR) 10 MG tablet TAKE 1 TABLET DAILY    . telmisartan (MICARDIS) 80 MG tablet Take 80 mg by mouth daily.    . valGANciclovir (VALCYTE) 450 MG tablet Take 450 mg by mouth daily.   0  . albuterol (PROVENTIL HFA;VENTOLIN HFA) 108 (90 Base) MCG/ACT inhaler Inhale into the lungs.    . Blood Glucose Monitoring Suppl (FIFTY50 GLUCOSE METER 2.0) w/Device KIT     . cyclobenzaprine (FLEXERIL) 5 MG tablet Take by mouth.    . diltiazem (TIAZAC) 180 MG 24 hr capsule Take 180 mg by mouth daily.     No current facility-administered medications for this visit.     OBJECTIVE: BP 129/84 (BP Location: Left Arm, Patient Position: Sitting)   Pulse 78   Temp 98.3 F (36.8 C) (Tympanic)   Resp 18   Wt 231 lb 9.6 oz (105.1 kg)   BMI 31.41 kg/m    Body mass index is 31.41 kg/m.    ECOG FS:1 - Symptomatic but completely ambulatory  General: Well-developed, well-nourished, no acute distress. Eyes: Pink conjunctiva, anicteric sclera. HEENT: Normocephalic, moist mucous membranes, clear oropharnyx. Lungs: Clear to auscultation bilaterally. Heart: Regular rate and rhythm. No rubs, murmurs, or gallops. Abdomen: Soft, nontender, nondistended. No organomegaly noted, normoactive bowel sounds. Musculoskeletal: No edema, cyanosis, or clubbing. Neuro: Alert, answering all questions appropriately. Cranial nerves grossly intact. Skin: No rashes or petechiae noted. Psych: Normal affect. Lymphatics: No cervical, calvicular, axillary or inguinal LAD.  LAB RESULTS:  Appointment on 02/02/2018  Component Date Value Ref Range Status  . LDH 02/02/2018 117  98 - 192 U/L Final   Performed at [facility], [address] [place], [place] [address]  . WBC 02/02/2018 6.7  4.0 - 10.5 K/uL Final  . RBC 02/02/2018 5.14* 3.87 - 5.11 MIL/uL Final  . Hemoglobin 02/02/2018 12.5  12.0 - 15.0 g/dL Final  . HCT 02/02/2018 40.5  36.0 - 46.0 % Final  . MCV 02/02/2018 78.8* 80.0 - 100.0 fL Final  . MCH  02/02/2018 24.3* 26.0 - 34.0 pg Final  . MCHC 02/02/2018 30.9  30.0 - 36.0 g/dL Final  . RDW 02/02/2018 14.2  11.5 - 15.5 % Final  . Platelets 02/02/2018 323  150 - 400 K/uL Final  . nRBC 02/02/2018 0.0  0.0 - 0.2 % Final  . Neutrophils Relative % 02/02/2018 57  % Final  . Neutro Abs 02/02/2018 3.8  1.7 - 7.7 K/uL Final  . Lymphocytes Relative 02/02/2018 33  % Final  . Lymphs Abs 02/02/2018 2.2  0.7 - 4.0 K/uL Final  . Monocytes Relative 02/02/2018 7  % Final  . Monocytes Absolute 02/02/2018 0.4  0.1 - 1.0 K/uL Final  . Eosinophils Relative 02/02/2018 2  % Final  . Eosinophils Absolute 02/02/2018 0.1  0.0 - 0.5 K/uL Final  . Basophils Relative 02/02/2018 1  % Final  . Basophils Absolute 02/02/2018 0.0  0.0 - 0.1 K/uL Final  . Immature Granulocytes 02/02/2018 0  % Final  . Abs Immature Granulocytes 02/02/2018 0.03  0.00 - 0.07 K/uL Final   Performed at [facility], [address], [place], [place] [address]  . Sodium 02/02/2018 141  135 - 145 mmol/L Final  . Potassium 02/02/2018 3.9  3.5 - 5.1 mmol/L Final  . Chloride 02/02/2018 105  98 - 111 mmol/L Final  . CO2 02/02/2018 25  22 - 32 mmol/L Final  . Glucose, Bld 02/02/2018 177* 70 - 99 mg/dL Final  . BUN 02/02/2018 12  6 - 20 mg/dL Final  . Creatinine, Ser 02/02/2018 0.84  0.44 - 1.00 mg/dL Final  . Calcium 02/02/2018 9.6  8.9 - 10.3 mg/dL Final  . Total Protein 02/02/2018 6.9  6.5 - 8.1 g/dL Final  . Albumin 02/02/2018 4.3  3.5 - 5.0 g/dL Final  . AST 02/02/2018 26  15 - 41 U/L Final  . ALT 02/02/2018 36  0 - 44 U/L Final  . Alkaline Phosphatase 02/02/2018 85  38 - 126 U/L Final  . Total Bilirubin 02/02/2018 0.6  0.3 - 1.2 mg/dL Final  . GFR calc non Af Amer 02/02/2018 >60  >60 mL/min Final  . GFR calc Af Amer 02/02/2018 >60  >60 mL/min Final   Comment: (NOTE) The eGFR has been calculated using the CKD EPI equation. This calculation has not been validated in all clinical situations. eGFR's persistently <60 mL/min signify  possible Chronic Kidney Disease.   Georgiann Hahn gap 02/02/2018 11  5 - 15 Final   Performed at [facility], [address] [place], [place] [address]    STUDIES: No results found.  ASSESSMENT: CLL  PLAN:   1. CLL: No evidence of disease. Patient's white blood cell count continues to be within normal limits.  CT scan results from April 13, 2016 were reviewed independently with no evidence of disease. Patient does not require additional treatment at this time. Patient has also requested no further CTs be done unless there is consideration of reinitiation of treatment. If patient in fact does need to be retreated, she will need to be heavily pretreated since patient had a previous reaction to Rituxan.  Return to clinic in 6 months with laboratory work only and then in 1 year for laboratory work and further evaluation.   2. Anxiety: Improved. Patient will require Ativan with any future CT scans. 3. Hyperglycemia: Chronic and unchanged.  Continue current diabetic medications. Treatment per PCP. 4. Anemia: Resolved. Patient had a colonoscopy in February 2017 that was reported as normal.    Patient expressed understanding and was in agreement with this plan. She also understands that She can call clinic at any time with any questions, concerns, or complaints.   Lloyd Huger, MD   02/04/2018 8:03 AM

## 2018-02-02 ENCOUNTER — Encounter (INDEPENDENT_AMBULATORY_CARE_PROVIDER_SITE_OTHER): Payer: Self-pay

## 2018-02-02 ENCOUNTER — Inpatient Hospital Stay: Payer: Medicare Other | Attending: Oncology

## 2018-02-02 ENCOUNTER — Inpatient Hospital Stay (HOSPITAL_BASED_OUTPATIENT_CLINIC_OR_DEPARTMENT_OTHER): Payer: Medicare Other | Admitting: Oncology

## 2018-02-02 ENCOUNTER — Other Ambulatory Visit: Payer: Self-pay

## 2018-02-02 VITALS — BP 129/84 | HR 78 | Temp 98.3°F | Resp 18 | Wt 231.6 lb

## 2018-02-02 DIAGNOSIS — F329 Major depressive disorder, single episode, unspecified: Secondary | ICD-10-CM | POA: Insufficient documentation

## 2018-02-02 DIAGNOSIS — E119 Type 2 diabetes mellitus without complications: Secondary | ICD-10-CM | POA: Insufficient documentation

## 2018-02-02 DIAGNOSIS — C9111 Chronic lymphocytic leukemia of B-cell type in remission: Secondary | ICD-10-CM

## 2018-02-02 DIAGNOSIS — Z803 Family history of malignant neoplasm of breast: Secondary | ICD-10-CM

## 2018-02-02 DIAGNOSIS — J45909 Unspecified asthma, uncomplicated: Secondary | ICD-10-CM | POA: Insufficient documentation

## 2018-02-02 DIAGNOSIS — M129 Arthropathy, unspecified: Secondary | ICD-10-CM

## 2018-02-02 DIAGNOSIS — I10 Essential (primary) hypertension: Secondary | ICD-10-CM | POA: Insufficient documentation

## 2018-02-02 DIAGNOSIS — E039 Hypothyroidism, unspecified: Secondary | ICD-10-CM | POA: Insufficient documentation

## 2018-02-02 DIAGNOSIS — E1165 Type 2 diabetes mellitus with hyperglycemia: Secondary | ICD-10-CM

## 2018-02-02 DIAGNOSIS — Z809 Family history of malignant neoplasm, unspecified: Secondary | ICD-10-CM | POA: Diagnosis not present

## 2018-02-02 DIAGNOSIS — C9110 Chronic lymphocytic leukemia of B-cell type not having achieved remission: Secondary | ICD-10-CM

## 2018-02-02 DIAGNOSIS — Z90710 Acquired absence of both cervix and uterus: Secondary | ICD-10-CM | POA: Insufficient documentation

## 2018-02-02 DIAGNOSIS — F419 Anxiety disorder, unspecified: Secondary | ICD-10-CM | POA: Insufficient documentation

## 2018-02-02 DIAGNOSIS — R42 Dizziness and giddiness: Secondary | ICD-10-CM

## 2018-02-02 DIAGNOSIS — Z79899 Other long term (current) drug therapy: Secondary | ICD-10-CM | POA: Insufficient documentation

## 2018-02-02 DIAGNOSIS — Z794 Long term (current) use of insulin: Secondary | ICD-10-CM

## 2018-02-02 LAB — CBC WITH DIFFERENTIAL/PLATELET
Abs Immature Granulocytes: 0.03 10*3/uL (ref 0.00–0.07)
BASOS PCT: 1 %
Basophils Absolute: 0 10*3/uL (ref 0.0–0.1)
EOS ABS: 0.1 10*3/uL (ref 0.0–0.5)
EOS PCT: 2 %
HEMATOCRIT: 40.5 % (ref 36.0–46.0)
Hemoglobin: 12.5 g/dL (ref 12.0–15.0)
IMMATURE GRANULOCYTES: 0 %
LYMPHS ABS: 2.2 10*3/uL (ref 0.7–4.0)
Lymphocytes Relative: 33 %
MCH: 24.3 pg — ABNORMAL LOW (ref 26.0–34.0)
MCHC: 30.9 g/dL (ref 30.0–36.0)
MCV: 78.8 fL — AB (ref 80.0–100.0)
Monocytes Absolute: 0.4 10*3/uL (ref 0.1–1.0)
Monocytes Relative: 7 %
NEUTROS PCT: 57 %
Neutro Abs: 3.8 10*3/uL (ref 1.7–7.7)
Platelets: 323 10*3/uL (ref 150–400)
RBC: 5.14 MIL/uL — ABNORMAL HIGH (ref 3.87–5.11)
RDW: 14.2 % (ref 11.5–15.5)
WBC: 6.7 10*3/uL (ref 4.0–10.5)
nRBC: 0 % (ref 0.0–0.2)

## 2018-02-02 LAB — COMPREHENSIVE METABOLIC PANEL
ALT: 36 U/L (ref 0–44)
AST: 26 U/L (ref 15–41)
Albumin: 4.3 g/dL (ref 3.5–5.0)
Alkaline Phosphatase: 85 U/L (ref 38–126)
Anion gap: 11 (ref 5–15)
BILIRUBIN TOTAL: 0.6 mg/dL (ref 0.3–1.2)
BUN: 12 mg/dL (ref 6–20)
CO2: 25 mmol/L (ref 22–32)
CREATININE: 0.84 mg/dL (ref 0.44–1.00)
Calcium: 9.6 mg/dL (ref 8.9–10.3)
Chloride: 105 mmol/L (ref 98–111)
Glucose, Bld: 177 mg/dL — ABNORMAL HIGH (ref 70–99)
POTASSIUM: 3.9 mmol/L (ref 3.5–5.1)
Sodium: 141 mmol/L (ref 135–145)
TOTAL PROTEIN: 6.9 g/dL (ref 6.5–8.1)

## 2018-02-02 LAB — LACTATE DEHYDROGENASE: LDH: 117 U/L (ref 98–192)

## 2018-02-02 NOTE — Progress Notes (Signed)
Here for follow up. Per pt has had headache,nausea and vomiting ( 2 x in 2 w) and diarrhea -last 2 d last week and 1 d this week. Has not seen PCP

## 2018-02-09 ENCOUNTER — Ambulatory Visit: Payer: Medicare Other | Attending: Neurology

## 2018-02-09 DIAGNOSIS — G4700 Insomnia, unspecified: Secondary | ICD-10-CM | POA: Diagnosis not present

## 2018-02-09 DIAGNOSIS — R0683 Snoring: Secondary | ICD-10-CM | POA: Diagnosis present

## 2018-02-09 DIAGNOSIS — R5383 Other fatigue: Secondary | ICD-10-CM | POA: Diagnosis not present

## 2018-02-16 ENCOUNTER — Encounter: Payer: Self-pay | Admitting: Infectious Diseases

## 2018-02-16 ENCOUNTER — Ambulatory Visit: Payer: Medicare Other | Attending: Infectious Diseases | Admitting: Infectious Diseases

## 2018-02-16 ENCOUNTER — Other Ambulatory Visit
Admission: RE | Admit: 2018-02-16 | Discharge: 2018-02-16 | Disposition: A | Discharge status: Home / Self Care | Payer: Medicare Other | Source: Ambulatory Visit | Attending: Infectious Diseases | Admitting: Infectious Diseases

## 2018-02-16 VITALS — BP 153/91 | HR 80 | Temp 97.7°F | Wt 233.8 lb

## 2018-02-16 DIAGNOSIS — Z9889 Other specified postprocedural states: Secondary | ICD-10-CM | POA: Diagnosis not present

## 2018-02-16 DIAGNOSIS — E7800 Pure hypercholesterolemia, unspecified: Secondary | ICD-10-CM | POA: Diagnosis not present

## 2018-02-16 DIAGNOSIS — R238 Other skin changes: Secondary | ICD-10-CM | POA: Diagnosis not present

## 2018-02-16 DIAGNOSIS — A6004 Herpesviral vulvovaginitis: Secondary | ICD-10-CM | POA: Insufficient documentation

## 2018-02-16 DIAGNOSIS — E039 Hypothyroidism, unspecified: Secondary | ICD-10-CM | POA: Diagnosis not present

## 2018-02-16 DIAGNOSIS — M1990 Unspecified osteoarthritis, unspecified site: Secondary | ICD-10-CM | POA: Insufficient documentation

## 2018-02-16 DIAGNOSIS — B009 Herpesviral infection, unspecified: Secondary | ICD-10-CM

## 2018-02-16 DIAGNOSIS — Z7984 Long term (current) use of oral hypoglycemic drugs: Secondary | ICD-10-CM

## 2018-02-16 DIAGNOSIS — I10 Essential (primary) hypertension: Secondary | ICD-10-CM | POA: Insufficient documentation

## 2018-02-16 DIAGNOSIS — Z9221 Personal history of antineoplastic chemotherapy: Secondary | ICD-10-CM

## 2018-02-16 DIAGNOSIS — M170 Bilateral primary osteoarthritis of knee: Secondary | ICD-10-CM

## 2018-02-16 DIAGNOSIS — Z79899 Other long term (current) drug therapy: Secondary | ICD-10-CM | POA: Insufficient documentation

## 2018-02-16 DIAGNOSIS — Z794 Long term (current) use of insulin: Secondary | ICD-10-CM | POA: Diagnosis not present

## 2018-02-16 DIAGNOSIS — C9111 Chronic lymphocytic leukemia of B-cell type in remission: Secondary | ICD-10-CM | POA: Diagnosis not present

## 2018-02-16 DIAGNOSIS — E119 Type 2 diabetes mellitus without complications: Secondary | ICD-10-CM | POA: Diagnosis not present

## 2018-02-16 DIAGNOSIS — Z90710 Acquired absence of both cervix and uterus: Secondary | ICD-10-CM | POA: Diagnosis not present

## 2018-02-16 DIAGNOSIS — Z79890 Hormone replacement therapy (postmenopausal): Secondary | ICD-10-CM | POA: Diagnosis not present

## 2018-02-16 DIAGNOSIS — A6000 Herpesviral infection of urogenital system, unspecified: Secondary | ICD-10-CM | POA: Insufficient documentation

## 2018-02-16 MED ORDER — VALACYCLOVIR HCL 1 G PO TABS: 1000.0000 mg | ORAL_TABLET | Freq: Two times a day (BID) | ORAL | 0 refills | Status: DC

## 2018-02-16 NOTE — Patient Instructions (Addendum)
You are here for recurrent and persistent herpes infection- you were followed by Dr.Fitzgerald before. You have a few reasons for the persistence  A)Steroid use ( injections you get in your knees every 3-4 months) b)CLL C) One of your DM medication cDapagliflozin causes glycosuria and puts you at risk for candida and you itch and then you could get a herpes breakout D)will  check to see whether you have reisstance to medication Meanwhile we can  change your prescription from valganciclovir to valcyclovir as today your lesions are very minimal and already healing

## 2018-02-16 NOTE — Progress Notes (Signed)
NAME: Tonya Myers  DOB: 09-07-1965  MRN: [id number]  Date/Time: 02/16/2018 10:47 AM Subjective:  REASON FOR CONSULT: Herpes genitalis infection Patient has a history of herpes simplex vulvovaginitis for the past 2 to 3 years ? Tonya Myers is a 52 y.o. 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 Ola Spurr for recurrent vulvovaginal herpes.  As he is now living in [place] patient is here to establish care.  History from patient and and chart reviewed. Patient was diagnosed with CLL in September 2016 and started Rituxan and Bendamustine for 4 cycles related to December 2016 . Patient started her first outbreak in October 2016 after she got chemo for CLL.  She was treated with acyclovir and then Valtrex as well as topical treatment but as the lesion continue to recur saw Dr. Ola Spurr in June 2017 because of persistent lesions.  On October 07, 2015 he swabbed 1 of the lesions and send it for PCR and told her to increase the Valtrex to 2 g 3 times daily for 1 week.  HSV-1 DNA negative but HSV-2 DNA was positive and she was started on valganciclovir because The regular antivirals were not working.  HIV was negative.  Been seeing fistula on a regular basis and last saw him on October 04, 2017.  That visit she was off valganciclovir for nearly 6 weeks as she had not had any vaginal lesions.  Meanwhile the patient has also been getting corticosteroid injection into her knee since 2018 on a regular basis.  She now has a new outbreak for the past few days in her vaginal area and hence she is here to see me. She states she noted 1 or 2 bumps and started taking the valganciclovir again.  She also had itching and burning before the bumps happened.  She gets recurrent vaginal yeast infection f  She is on SGTP 1 inhibitor for diabetes .  She got a cortisone shot to her knee on 02/15/2018   Medical History Arthritis  . Asthma without status asthmaticus, unspecified   . CLL (chronic lymphocytic leukemia) (CMS-HCC)  . Diabetes mellitus type 2, uncomplicated (CMS-HCC)  . High cholesterol  . Hypertension  . Hypothyroid, unspecified  . Internal hemorrhoids 06/16/2015  Past surgical history Hysterectomy 2009 Colonoscopy 06/16/2015 Breast biopsy right   Social history Patient lives by herself She is retired now She used to be a bus Insurance claims handler Occasional alcohol Has 2 dogs at home Family History  Problem Relation Age of Onset  . Cancer Paternal Aunt   . Breast cancer Maternal Aunt 70   No Known Allergies ? Current Outpatient Medications  Medication Sig Dispense Refill  . Blood Glucose Monitoring Suppl (FIFTY50 GLUCOSE METER 2.0) w/Device KIT     . cyclobenzaprine (FLEXERIL) 5 MG tablet Take by mouth.    . Dapagliflozin-Metformin HCl ER 08-998 MG TB24 Take by mouth.     . Diclofenac Sodium CR 100 MG 24 hr tablet Take by mouth.    . diltiazem (DILACOR XR) 120 MG 24 hr capsule Take by mouth.    . diltiazem (TIAZAC) 180 MG 24 hr capsule Take 180 mg by mouth daily.    . Dulaglutide (TRULICITY [place]) Inject 1 Dose into the skin once a week.    . ezetimibe (ZETIA) 10 MG tablet Take 10 mg by mouth daily.     . Fluticasone-Salmeterol (ADVAIR) 250-50 MCG/DOSE AEPB Inhale 1 puff into the lungs.     . furosemide (  LASIX) 20 MG tablet Take by mouth. Reported on 07/29/2015    . glucose blood (ONE TOUCH ULTRA TEST) test strip USE THREE TIMES A DAY. USE AS INSTRUCTED    . Insulin Glargine (LANTUS) 100 UNIT/ML Solostar Pen Inject 30 Units into the skin at bedtime.     . Lancets Misc. (UNISTIK 2 NORMAL) MISC 1 each.    . levothyroxine (SYNTHROID, LEVOTHROID) 150 MCG tablet Take 150 mcg by mouth daily before breakfast.     . montelukast (SINGULAIR) 10 MG tablet Take 10 mg by mouth at bedtime.    [name] DELICA LANCETS 81X MISC USE THREE TIMES A DAY. USE AS INSTRUCTED    . pregabalin (LYRICA) 75 MG capsule Take 75 mg by mouth 2 (two) times daily.    .  rosuvastatin (CRESTOR) 10 MG tablet TAKE 1 TABLET DAILY    . telmisartan (MICARDIS) 80 MG tablet Take 80 mg by mouth daily.    . valGANciclovir (VALCYTE) 450 MG tablet Take 450 mg by mouth daily.   0  . albuterol (PROVENTIL HFA;VENTOLIN HFA) 108 (90 Base) MCG/ACT inhaler Inhale into the lungs.     No current facility-administered medications for this visit.      Abtx:  Anti-infectives (From admission, onward)   None      REVIEW OF SYSTEMS:  Const: negative fever, negative chills, negative weight loss Eyes: negative diplopia or visual changes, negative eye pain ENT: negative coryza, negative sore throat Resp: negative cough, hemoptysis, dyspnea Cards: negative for chest pain, palpitations, lower extremity edema GU: negative for frequency, dysuria and hematuria GI: Negative for abdominal pain, diarrhea, bleeding, constipation Skin: negative for rash and pruritus Heme: negative for easy bruising and gum/nose bleeding MS: arthralgias, back pain and muscle weakness Neurolo:negative for headaches, dizziness, vertigo, memory problems  Psych: anxiety, depression  Endocrine: Polyuria, polydipsia Allergy/Immunology- negative for any medication or food allergies  Objective:  VITALS:  BP (!) 153/91 (BP Location: Left Arm, Patient Position: Sitting)   Pulse 80   Temp 97.7 F (36.5 C) (Oral)   Wt 233 lb 12.8 oz (106.1 kg)   BMI 31.71 kg/m  PHYSICAL EXAM:  General: Alert, cooperative, no distress, appears stated age.  Head: Normocephalic, without obvious abnormality, atraumatic. Eyes: Conjunctivae clear, anicteric sclerae. Pupils are equal ENT Nares normal. No drainage or sinus tenderness. Lips, mucosa, and tongue normal. No Thrush Neck: Supple, symmetrical, no adenopathy, thyroid: non tender no carotid bruit and no JVD. Back: No CVA tenderness. Lungs: Clear to auscultation bilaterally. No Wheezing or Rhonchi. No rales. Heart: Regular rate and rhythm, no murmur, rub or  gallop. Abdomen: Soft, non-tender,not distended. Bowel sounds normal. No masses Extremities: atraumatic, no cyanosis. No edema. No clubbing Skin: No rashes or lesions. Or bruising Lymph: Cervical, supraclavicular normal. Neurologic: Grossly non-focal  there was one small papule  at the fourchette skin Pertinent Labs Creatinine 0.84 from 02/02/2018 Hb 12.5 WBC 6.7 02/08/2018 HbA1c 7.6  ? Impression/Recommendation ?52 y.o. 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 Ola Spurr for recurrent vulvovaginal herpes.  As he is now living in [place] patient is here to establish care.  History from patient and and chart reviewed. Patient was diagnosed with CLL in September 2016 and started Rituxan and Bendamustine for 4 cycles related to December 2016 . Patient started her first outbreak in October 2016 after she got chemo for CLL.  She was treated with acyclovir and then Valtrex as well as topical treatment  but as the lesion continue to recur saw Dr. Ola Spurr in June 2017 because of persistent lesions.  On October 07, 2015 he swabbed 1 of the lesions and send it for PCR and told her to increase the Valtrex to 2 g 3 times daily for 1 week.  HSV-1 DNA negative but HSV-2 DNA was positive and she was started on valganciclovir because the regular antivirals were not working.  HIV was negative.  ? ?HSV infection of the vulval area.  She has today a small papule at the fourchette.   I swabbed that for culture and testing for acyclovir assistance. She is at risk for getting recurrent herpes infections for the following reason.   a) She gets frequent cortisone injections so immunecompromised b)She also has CLL which is currently under remission but the herpes outbreak started when she started chemotherapy for CLL in 2016. C) She also is on an SGTP 1 inhibitor which causes glycosuria putting her at risk for fungal infection and she scratches the skin  may be  triggering an herpes outbreak  as well. D) need to rule out acyclovir resistance Sent culture /resistance testing today  DC valganciclovir and start  valcyclovir  HTN on telmisartan DM on metformin, SGTP! Inhibitor, trulicity  OA knees - gets steroid intraarticular injections- to limit/avoid  CLL treated in remission   ___________________________________________________ Discussed with patient, Will follow up after the results

## 2018-02-27 LAB — MISC LABCORP TEST (SEND OUT): Labcorp test code: 138370

## 2018-03-20 ENCOUNTER — Ambulatory Visit (INDEPENDENT_AMBULATORY_CARE_PROVIDER_SITE_OTHER): Payer: Medicare Other | Admitting: Obstetrics and Gynecology

## 2018-03-20 ENCOUNTER — Encounter: Payer: Self-pay | Admitting: Obstetrics and Gynecology

## 2018-03-20 VITALS — BP 136/80 | Ht 72.0 in | Wt 235.0 lb

## 2018-03-20 DIAGNOSIS — N898 Other specified noninflammatory disorders of vagina: Secondary | ICD-10-CM

## 2018-03-20 NOTE — Progress Notes (Addendum)
Patient ID: Tonya Myers, female   DOB: 1966-02-03, 52 y.o.   MRN: [id number]  Reason for Consult: Vaginal Lump (Pea size lump on inside of vaginal wall noticed in shower this morning)   Referred by Copland, Deirdre Evener, PA-C  Subjective:     HPI:  Tonya Myers is a 52 y.o. female  She reports that this morning in the shower she felt a small lpea sized lump in her left labia. She is currently being treated for a yeast infection.  Past Medical History:  Diagnosis Date  . Acute hemorrhoid 03/11/2015  . Anxiety   . Arthritis   . Asthma   . Cancer ([place])   . Chronic back pain   . CLL (chronic lymphocytic leukemia) ([place])   . Depression   . Diabetes mellitus without complication ([place])   . Genital herpes    type 2  . Hypertension   . Hypothyroidism   . Vertigo    Family History  Problem Relation Age of Onset  . Cancer Paternal Aunt   . Breast cancer Maternal Aunt 70   Past Surgical History:  Procedure Laterality Date  . ABDOMINAL HYSTERECTOMY    . BREAST BIOPSY Right 05/17/2016   FIBROADENOMATOUS CHANGE AND SCLEROSING ADENOSIS WITH  . COLONOSCOPY WITH PROPOFOL N/A 06/16/2015   Procedure: COLONOSCOPY WITH PROPOFOL;  Surgeon: [name], MD;  Location: [facility] ENDOSCOPY;  Service: Endoscopy;  Laterality: N/A;    Short Social History:  Social History   Tobacco Use  . Smoking status: Never Smoker  . Smokeless tobacco: Never Used  Substance Use Topics  . Alcohol use: Yes    Alcohol/week: 1.0 standard drinks    Types: 1 Cans of beer per week    No Known Allergies  Current Outpatient Medications  Medication Sig Dispense Refill  . Blood Glucose Monitoring Suppl (FIFTY50 GLUCOSE METER 2.0) w/Device KIT     . cyclobenzaprine (FLEXERIL) 5 MG tablet Take by mouth.    . diclofenac (VOLTAREN) 75 MG EC tablet   1  . dicyclomine (BENTYL) 20 MG tablet Take by mouth.    . diltiazem (DILACOR XR) 120 MG 24 hr capsule Take by mouth.    . diltiazem (TIAZAC) 180 MG 24 hr  capsule Take 180 mg by mouth daily.    . Dulaglutide (TRULICITY [place]) Inject 1 Dose into the skin once a week.    . estradiol (ESTRACE) 0.5 MG tablet   5  . ezetimibe (ZETIA) 10 MG tablet Take 10 mg by mouth daily.     . fluconazole (DIFLUCAN) 150 MG tablet   0  . Fluticasone-Salmeterol (ADVAIR) 250-50 MCG/DOSE AEPB Inhale 1 puff into the lungs.     . furosemide (LASIX) 20 MG tablet Take by mouth. Reported on 07/29/2015    . glucose blood (ONE TOUCH ULTRA TEST) test strip USE THREE TIMES A DAY. USE AS INSTRUCTED    . Insulin Glargine (LANTUS) 100 UNIT/ML Solostar Pen Inject 30 Units into the skin at bedtime.     Marland Kitchen ketoconazole (NIZORAL) 2 % cream Wash scalp and face 2-3 times weekly. Let sit for 5 minutes then wash out.    . Lancets Misc. (UNISTIK 2 NORMAL) MISC 1 each.    Marland Kitchen LEVEMIR FLEXTOUCH 100 UNIT/ML Pen ADM 36 UNI [place] Q NIGHT  5  . levothyroxine (SYNTHROID, LEVOTHROID) 150 MCG tablet Take 150 mcg by mouth daily before breakfast.     . montelukast (SINGULAIR) 10 MG tablet Take 10 mg by mouth at bedtime.    Marland Kitchen  ONETOUCH DELICA LANCETS 09X MISC USE THREE TIMES A DAY. USE AS INSTRUCTED    . pregabalin (LYRICA) 75 MG capsule Take 75 mg by mouth 2 (two) times daily.    . rosuvastatin (CRESTOR) 10 MG tablet TAKE 1 TABLET DAILY    . telmisartan (MICARDIS) 80 MG tablet Take 80 mg by mouth daily.    . valACYclovir (VALTREX) 1000 MG tablet Take 1 tablet (1,000 mg total) by mouth 2 (two) times daily. 14 tablet 0  . valGANciclovir (VALCYTE) 450 MG tablet Take 450 mg by mouth daily.   0  . albuterol (PROVENTIL HFA;VENTOLIN HFA) 108 (90 Base) MCG/ACT inhaler Inhale into the lungs.     No current facility-administered medications for this visit.     Review of Systems  Constitutional: Negative for chills, fatigue, fever and unexpected weight change.  HENT: Negative for trouble swallowing.  Eyes: Negative for loss of vision.  Respiratory: Negative for cough, shortness of breath and wheezing.    Cardiovascular: Negative for chest pain, leg swelling, palpitations and syncope.  GI: Negative for abdominal pain, blood in stool, diarrhea, nausea and vomiting.  GU: Negative for difficulty urinating, dysuria, frequency and hematuria.  Musculoskeletal: Negative for back pain, leg pain and joint pain.  Skin: Negative for rash.  Neurological: Negative for dizziness, headaches, light-headedness, numbness and seizures.  Psychiatric: Negative for behavioral problem, confusion, depressed mood and sleep disturbance.        Objective:  Objective   Vitals:   03/20/18 1436  BP: 136/80  Weight: 235 lb (106.6 kg)  Height: 6' (1.829 m)   Body mass index is 31.87 kg/m.  Physical Exam  Constitutional: She is oriented to person, place, and time. She appears well-developed and well-nourished.  HENT:  Head: Normocephalic and atraumatic.  Eyes: Pupils are equal, round, and reactive to light. EOM are normal.  Cardiovascular: Normal rate and regular rhythm.  Pulmonary/Chest: Effort normal. No respiratory distress.  Genitourinary: Vagina normal.    There is no rash, tenderness, lesion or injury on the right labia. There is lesion on the left labia. There is no rash, tenderness or injury on the left labia. Cervix exhibits no motion tenderness. Right adnexum displays no mass, no tenderness and no fullness. Left adnexum displays no mass, no tenderness and no fullness. No bleeding in the vagina. No foreign body in the vagina.  Genitourinary Comments: Cervix visually normal Uterus and ovaries surgically absent per patient.  Neurological: She is alert and oriented to person, place, and time.  Skin: Skin is warm and dry.  Psychiatric: She has a normal mood and affect. Her behavior is normal. Judgment and thought content normal.  Nursing note and vitals reviewed.        Assessment/Plan:     52 yo with a vaginal lump  Small round lentil sized cyst palpated in labia- Discussed twice a day sitz  baths to help resolve, inclusion cyst, vs clogged duct, vs lymph node. Round and mobile. Asked her to follow up in 1 month to monitor the size.   Patient has chronic yeast infections. Was just treated for one on Friday- would be a good candidate for MDL yeast typing in the future.   More than 15 minutes were spent face to face with the patient in the room with more than 50% of the time spent providing counseling and discussing the plan of management.    Adrian Prows MD [facility], [facility] 03/20/18 3:01 PM

## 2018-05-30 ENCOUNTER — Encounter: Payer: Self-pay | Admitting: Infectious Diseases

## 2018-05-30 ENCOUNTER — Ambulatory Visit: Payer: Medicare Other | Attending: Infectious Diseases | Admitting: Infectious Diseases

## 2018-05-30 VITALS — BP 135/82 | HR 104 | Ht 72.0 in | Wt 235.1 lb

## 2018-05-30 DIAGNOSIS — I10 Essential (primary) hypertension: Secondary | ICD-10-CM

## 2018-05-30 DIAGNOSIS — C9111 Chronic lymphocytic leukemia of B-cell type in remission: Secondary | ICD-10-CM | POA: Diagnosis not present

## 2018-05-30 DIAGNOSIS — E119 Type 2 diabetes mellitus without complications: Secondary | ICD-10-CM

## 2018-05-30 DIAGNOSIS — M170 Bilateral primary osteoarthritis of knee: Secondary | ICD-10-CM

## 2018-05-30 DIAGNOSIS — Z9221 Personal history of antineoplastic chemotherapy: Secondary | ICD-10-CM

## 2018-05-30 DIAGNOSIS — Z79899 Other long term (current) drug therapy: Secondary | ICD-10-CM

## 2018-05-30 DIAGNOSIS — A6004 Herpesviral vulvovaginitis: Secondary | ICD-10-CM

## 2018-05-30 DIAGNOSIS — Z7984 Long term (current) use of oral hypoglycemic drugs: Secondary | ICD-10-CM

## 2018-05-30 DIAGNOSIS — A609 Anogenital herpesviral infection, unspecified: Secondary | ICD-10-CM

## 2018-05-30 MED ORDER — VALACYCLOVIR HCL 1 G PO TABS: 500.0000 mg | ORAL_TABLET | Freq: Two times a day (BID) | ORAL | 3 refills | Status: DC

## 2018-05-30 NOTE — Progress Notes (Signed)
Patient here to get medications for her herpes infection. Patient did not stay for the entire visit as she had to leave early and there was a delay in seeing her as well. 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 Tonya Myers is a 53 y.o. 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 Ola Spurr for recurrent vulvovaginal herpes.  As he is now living in [place] patient is here to establish care.  History from patient and and chart reviewed. Patient was diagnosed with CLL in September 2016 and started Rituxan and Bendamustine for 4 cycles related to December 2016 . Patient started her first outbreak in October 2016 after she got chemo for CLL.  She was treated with acyclovir and then Valtrex as well as topical treatment but as the lesion continue to recur saw Dr. Ola Spurr in June 2017 because of persistent lesions.  On October 07, 2015 he swabbed 1 of the lesions and send it for PCR and told her to increase the Valtrex to 2 g 3 times daily for 1 week.  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  I had seen her couple of months ago and sent her culture of the blister lesion to see whether we could do a culture and then sent for resistance testing.  But it was a negative culture and hence resistance to thymidine kinase could not be done.  Patient today wanted valganciclovir to be prescribed to her. Explained to her that valganciclovir is not needed unless there is improvement thymidine kinase resistance.  So gave a prescription for valacyclovir. If she has a new lesion she will come to the clinic then we can send cultures and also resistance testing.   a) She gets frequent cortisone injections so immunecompromised b)She also has CLL which is currently under remission but the herpes outbreak started when she started  chemotherapy for CLL in 2016. C) She also is on an SGTP 1 inhibitor which causes glycosuria putting her at risk for fungal infection and she scratches the skin  may be triggering an herpes outbreak  as well.  HTN on telmisartan DM on metformin, SGTP! Inhibitor, trulicity  OA knees - gets steroid intraarticular injections- to limit/avoid  CLL treated in remission   ___________________________________________________ Discussed with patient, Follow-up as needed

## 2018-07-18 ENCOUNTER — Ambulatory Visit: Payer: Self-pay | Admitting: Infectious Diseases

## 2018-07-19 ENCOUNTER — Telehealth: Payer: Self-pay | Admitting: Licensed Clinical Social Worker

## 2018-07-19 NOTE — Telephone Encounter (Signed)
Patient called stating that she has a feeling of burning and small bumps around vaginal and anal area. She states that the Valtrex she is on is not helping like her previous regimen. Patient states that she is currently taking 1 gram of Valtrex in the am, and 1 gram in the pm.

## 2018-07-19 NOTE — Telephone Encounter (Signed)
Please make an appt for her to see me on next week

## 2018-07-20 ENCOUNTER — Other Ambulatory Visit: Payer: Self-pay | Admitting: Licensed Clinical Social Worker

## 2018-07-20 DIAGNOSIS — A609 Anogenital herpesviral infection, unspecified: Secondary | ICD-10-CM

## 2018-07-20 MED ORDER — VALACYCLOVIR HCL 1 G PO TABS: 1000.0000 mg | ORAL_TABLET | Freq: Two times a day (BID) | ORAL | 1 refills | Status: DC

## 2018-07-20 NOTE — Telephone Encounter (Signed)
Yes I will send it in

## 2018-07-20 NOTE — Telephone Encounter (Signed)
Due to her compromised immune system with the leukemia she can't come in right now, but she wants to know can she keep taking 1 gram in the am and 1 gram in the pm? She said this is helping her right now and will be ok with this until she can come in.

## 2018-07-20 NOTE — Telephone Encounter (Signed)
She also states she will run out soon and wanted to know if she could get a refill with the increased dose?

## 2018-07-20 NOTE — Telephone Encounter (Signed)
She can continue to take 1 gram BID- can you call in for a month with 1 refill-thx

## 2018-08-03 ENCOUNTER — Inpatient Hospital Stay: Payer: Medicare Other

## 2018-10-10 ENCOUNTER — Other Ambulatory Visit: Payer: Self-pay | Admitting: Gastroenterology

## 2018-10-10 ENCOUNTER — Other Ambulatory Visit (HOSPITAL_COMMUNITY): Payer: Self-pay | Admitting: Gastroenterology

## 2018-10-10 DIAGNOSIS — R1013 Epigastric pain: Secondary | ICD-10-CM

## 2018-10-16 ENCOUNTER — Encounter
Admission: RE | Admit: 2018-10-16 | Discharge: 2018-10-16 | Disposition: A | Discharge status: Home / Self Care | Payer: Medicare Other | Source: Ambulatory Visit | Attending: Gastroenterology | Admitting: Gastroenterology

## 2018-10-16 ENCOUNTER — Other Ambulatory Visit: Payer: Self-pay | Admitting: Gastroenterology

## 2018-10-16 ENCOUNTER — Other Ambulatory Visit: Payer: Self-pay

## 2018-10-16 DIAGNOSIS — R1013 Epigastric pain: Secondary | ICD-10-CM | POA: Diagnosis not present

## 2018-10-16 IMAGING — RF UPPER GI SERIES W/HIGH DENSITY WITHOUT KUB
11 series · 11 of 11 positions shown · non-contrast
Comparison: CT [DATE].

CLINICAL DATA: Difficulty digesting food.

EXAM:
UPPER GI SERIES WITHOUT KUB
TECHNIQUE: Routine upper GI series was performed with high density and thin
barium.
FLUOROSCOPY TIME:  Fluoroscopy Time:  1 minutes 0 seconds
Radiation Exposure Index (if provided by the fluoroscopic device):
39.5 mGy
Number of Acquired Spot Images: 11

[Series 1: fluoro_barium 2fps_bw · 0.17mm/px · 1 of 1 slices shown (1 of 11)]
[im 1/1]
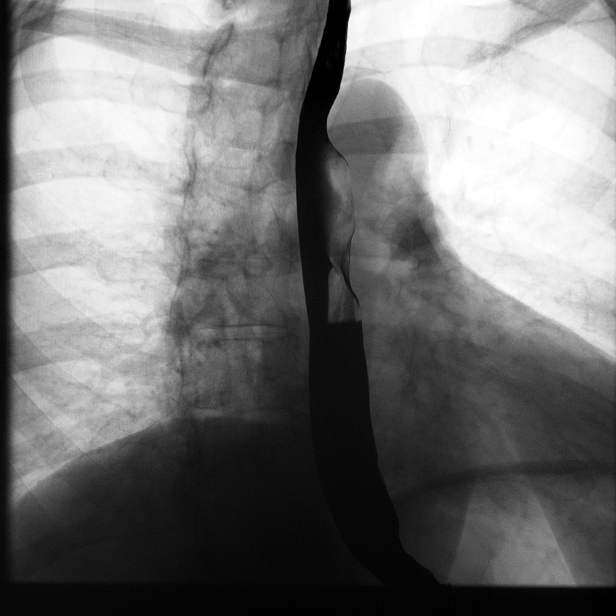

[Series 2: fluoro_barium 2fps_bw · 0.17mm/px · 1 of 1 slices shown (2 of 11)]
[im 1/1]
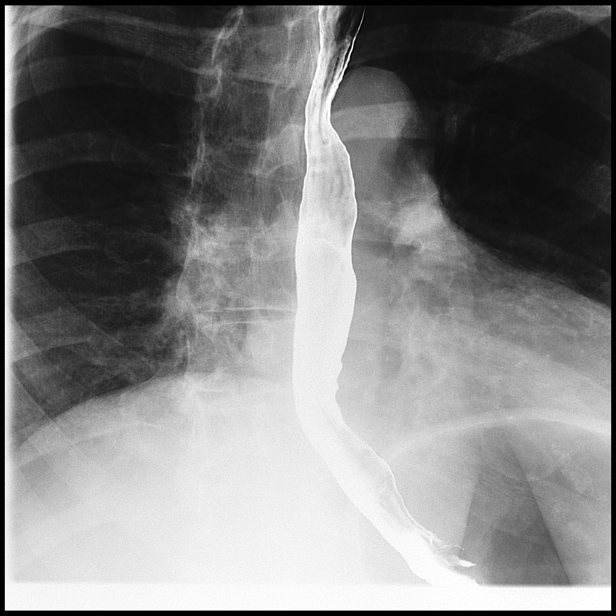

[Series 3: fluoro_barium 2fps_bw · 0.17mm/px · 1 of 1 slices shown (3 of 11)]
[im 1/1]
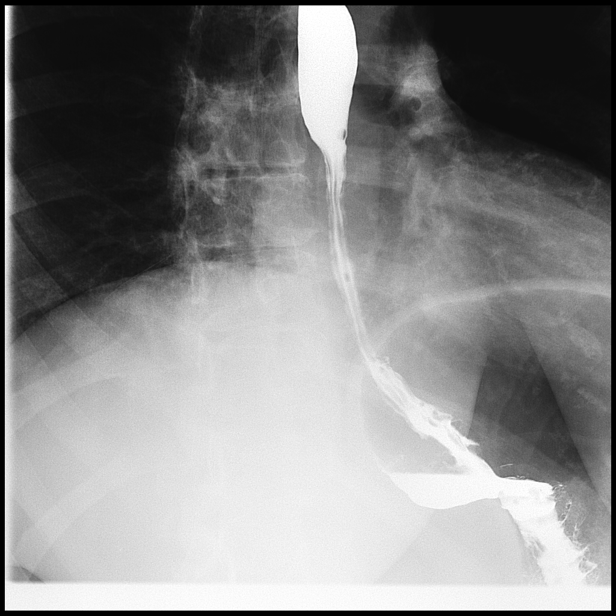

[Series 4: fluoro_barium 2fps_bw · 0.17mm/px · 1 of 1 slices shown (4 of 11)]
[im 1/1]
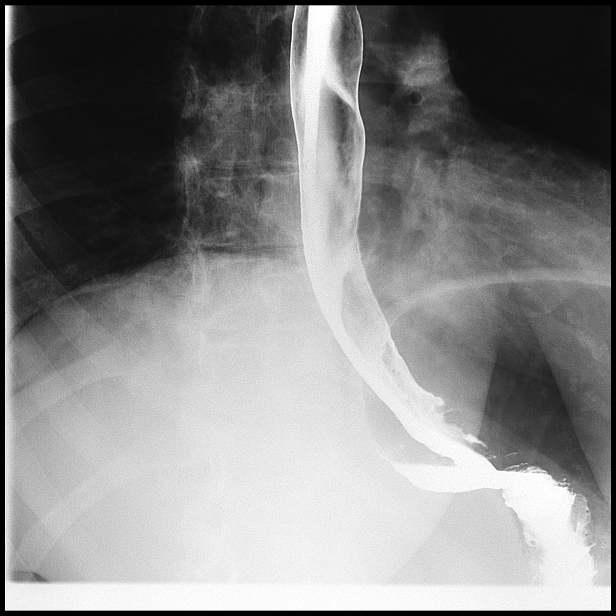

[Series 5: fluoro_barium 2fps_bw · 0.18mm/px · 1 of 1 slices shown (5 of 11)]
[im 1/1]
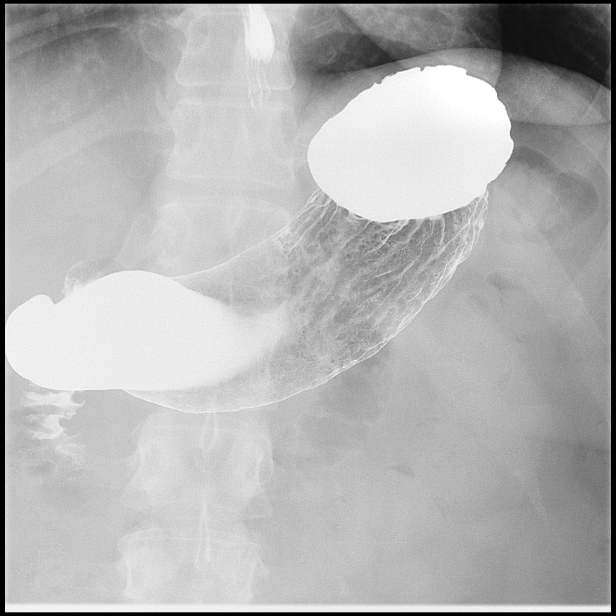

[Series 6: fluoro_barium 2fps_bw · 0.18mm/px · 1 of 1 slices shown (6 of 11)]
[im 1/1]
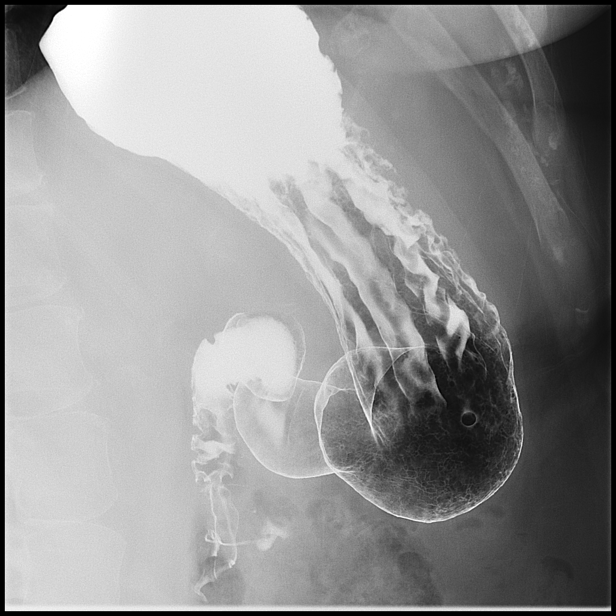

[Series 7: fluoro_barium 2fps_bw · 0.18mm/px · 1 of 1 slices shown (7 of 11)]
[im 1/1]
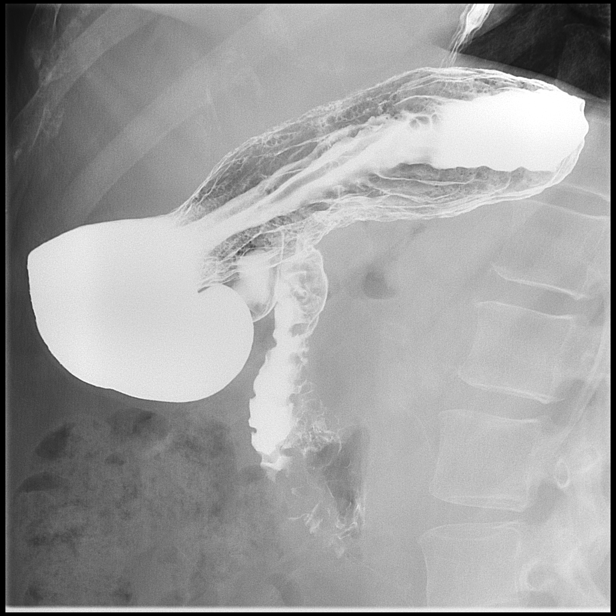

[Series 8: fluoro_barium 2fps_bw · 0.18mm/px · 1 of 1 slices shown (8 of 11)]
[im 1/1]
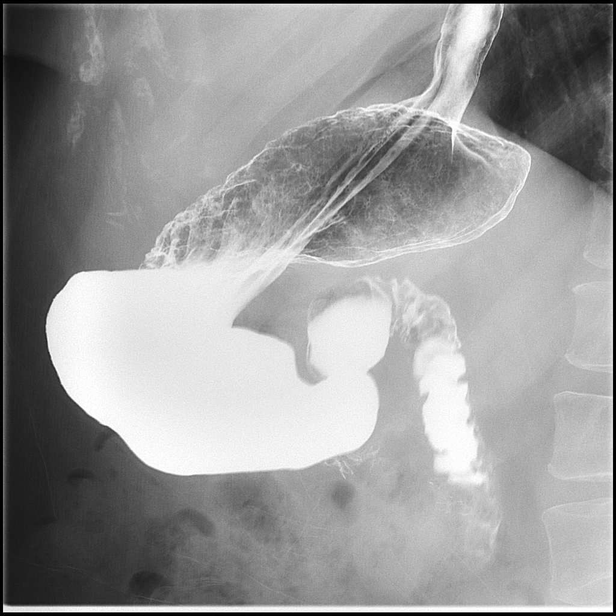

[Series 9: fluoro_barium 2fps_bw · 0.18mm/px · 1 of 1 slices shown (9 of 11)]
[im 1/1]
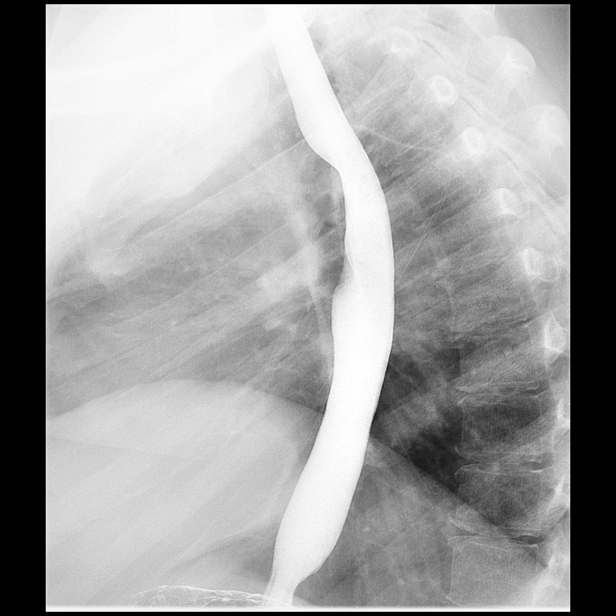

[Series 10: fluoro_barium 2fps_bw · 0.18mm/px · 1 of 1 slices shown (10 of 11)]
[im 1/1]
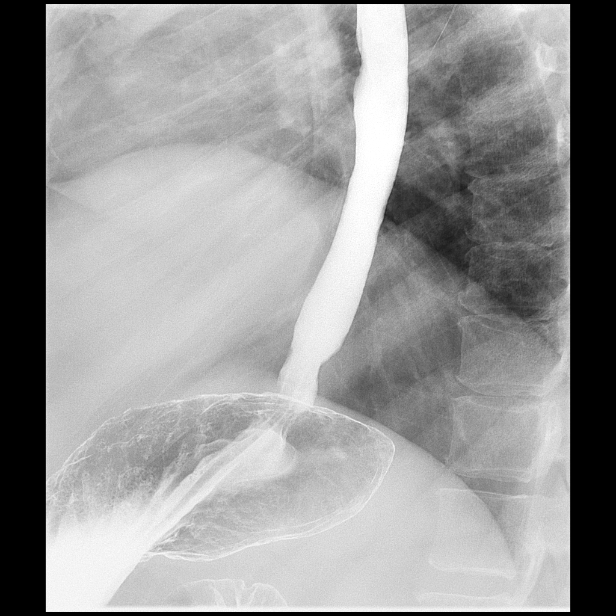

[Series 11: fluoro_barium 2fps_bw · 0.18mm/px · 1 of 1 slices shown (11 of 11)]
[im 1/1]
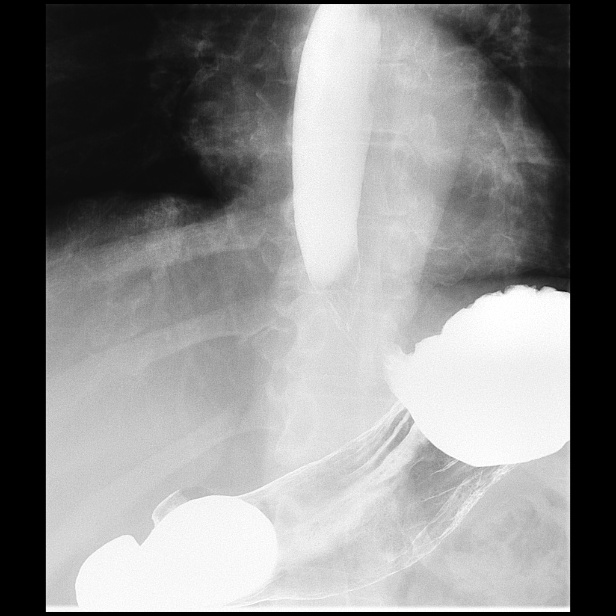

[11 of 11 positions shown; findings below may reference images not displayed]

FINDINGS: Esophagus is widely patent. No obstructing abnormality identified.
No significant reflux. Stomach is normal. Duodenum is normal. No
evidence of ulceration.
IMPRESSION: Negative exam.

## 2018-10-19 ENCOUNTER — Other Ambulatory Visit: Payer: Self-pay

## 2018-10-19 ENCOUNTER — Encounter
Admission: RE | Admit: 2018-10-19 | Discharge: 2018-10-19 | Disposition: A | Discharge status: Home / Self Care | Payer: Medicare Other | Source: Ambulatory Visit | Attending: Gastroenterology | Admitting: Gastroenterology

## 2018-10-19 DIAGNOSIS — R1013 Epigastric pain: Secondary | ICD-10-CM | POA: Diagnosis not present

## 2018-10-19 IMAGING — NM NUCLEAR MEDICINE GASTRIC EMPTYING STUDY
6 series · 20 of 20 positions shown · non-contrast
Comparison: None.

CLINICAL DATA: Nausea and vomiting

EXAM:
NUCLEAR MEDICINE GASTRIC EMPTYING SCAN
TECHNIQUE: After oral ingestion of radiolabeled meal, sequential abdominal
images were obtained for 4 hours. Percentage of activity emptying
the stomach was calculated at 1 hour, 2 hour, 3 hour, and 4 hours.
RADIOPHARMACEUTICALS:  2.67 mCi [FY] sulfur colloid in
standardized meal including egg

[Series 1000: gatric statics (results) · 3.90mm/px · 5 acquisitions, 10 frames shown]
[im 1/5]
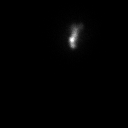
[im 1/5]
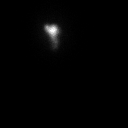
[im 2/5]
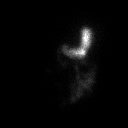
[im 2/5]
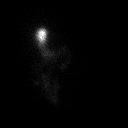
[im 3/5]
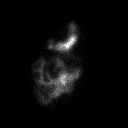
[im 3/5]
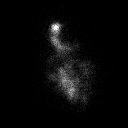
[im 4/5]
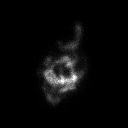
[im 4/5]
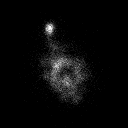
[im 5/5]
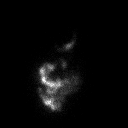
[im 5/5]
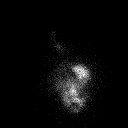

[Series 1000: gatric statics · 3.90mm/px · 2 of 2 frames shown (1 of 2)]
[frame 1/2]
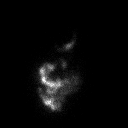
[frame 2/2]
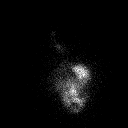

[Series 1000: gatric statics · 3.90mm/px · 2 of 2 frames shown (2 of 2)]
[frame 1/2]
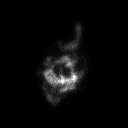
[frame 2/2]
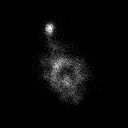

[Series 1000: gastric statics · 3.90mm/px · 2 of 2 frames shown (1 of 3)]
[frame 1/2]
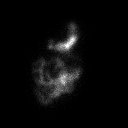
[frame 2/2]
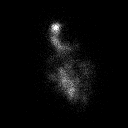

[Series 1000: gastric statics · 3.90mm/px · 2 of 2 frames shown (2 of 3)]
[frame 1/2]
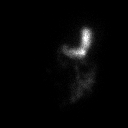
[frame 2/2]
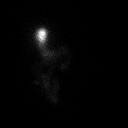

[Series 1000: gastric statics · 3.90mm/px · 2 of 2 frames shown (3 of 3)]
[frame 1/2]
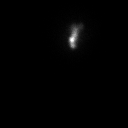
[frame 2/2]
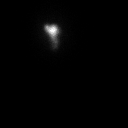

[20 of 20 positions shown; findings below may reference images not displayed]

FINDINGS: Expected location of the stomach in the left upper quadrant.
Ingested meal empties the stomach gradually over the course of the
study.

15% emptied at 1 hr ( normal >= 10%)

69% emptied at 2 hr ( normal >= 40%)

86% emptied at 3 hr ( normal >= 70%)

95% emptied at 4 hr ( normal >= 90%)
IMPRESSION: Normal gastric emptying study.

## 2018-10-19 MED ORDER — TECHNETIUM TC 99M SULFUR COLLOID
2.0000 | Freq: Once | INTRAVENOUS | Status: AC | PRN
  Administered 2018-10-19: 2.67 via ORAL

## 2019-01-12 NOTE — Progress Notes (Signed)
[place]  [phone]  [phone]     Tonya Myers DOB: October 24, 1965  MR#: [id number]  VVO#:[id number]  Patient Care Team: System, Provider Not In as PCP - General  CHIEF COMPLAINT:  CLL  INTERVAL HISTORY:   Patient returns to clinic today for routine yearly evaluation.  She currently feels well and is asymptomatic. She does not complain of weakness or fatigue. She denies any fevers, night sweats, or weight loss. She has no neurologic complaints.  She denies any chest pain, shortness of breath, cough, or hemoptysis.  She denies any nausea, vomiting, constipation, or diarrhea.  She has no urinary complaints.  Patient feels at her baseline offers no specific complaints today.   REVIEW OF SYSTEMS:   Review of Systems  Constitutional: Negative.  Negative for fever, malaise/fatigue and weight loss.  Respiratory: Negative.  Negative for cough and shortness of breath.   Cardiovascular: Negative.  Negative for chest pain and leg swelling.  Gastrointestinal: Negative.  Negative for abdominal pain, constipation and nausea.  Genitourinary: Negative.  Negative for dysuria.  Musculoskeletal: Negative.  Negative for joint pain.  Skin: Negative.  Negative for rash.  Neurological: Negative.  Negative for dizziness, sensory change, weakness and headaches.  Psychiatric/Behavioral: Negative.  Negative for depression. The patient is not nervous/anxious.     As per HPI. Otherwise, a complete review of systems is negative.   PAST MEDICAL HISTORY: Past Medical History:  Diagnosis Date  . Acute hemorrhoid 03/11/2015  . Anxiety   . Arthritis   . Asthma   . Cancer ([place])   . Chronic back pain   . CLL (chronic lymphocytic leukemia) ([place])   . Depression   . Diabetes mellitus without complication ([place])   . Genital herpes    type 2  . Hypertension   . Hypothyroidism   . Vertigo     PAST SURGICAL HISTORY: Past Surgical History:  Procedure Laterality Date  .  ABDOMINAL HYSTERECTOMY    . BREAST BIOPSY Right 05/17/2016   FIBROADENOMATOUS CHANGE AND SCLEROSING ADENOSIS WITH  . COLONOSCOPY WITH PROPOFOL N/A 06/16/2015   Procedure: COLONOSCOPY WITH PROPOFOL;  Surgeon: [name], MD;  Location: [facility] ENDOSCOPY;  Service: Endoscopy;  Laterality: N/A;    FAMILY HISTORY Family History  Problem Relation Age of Onset  . Cancer Paternal Aunt   . Breast cancer Maternal Aunt 19    GYNECOLOGIC HISTORY:  No LMP recorded. Patient has had a hysterectomy.     ADVANCED DIRECTIVES:    HEALTH MAINTENANCE: Social History   Tobacco Use  . Smoking status: Never Smoker  . Smokeless tobacco: Never Used  Substance Use Topics  . Alcohol use: Yes    Alcohol/week: 1.0 standard drinks    Types: 1 Cans of beer per week  . Drug use: No     No Known Allergies  Current Outpatient Medications  Medication Sig Dispense Refill  . Blood Glucose Monitoring Suppl (FIFTY50 GLUCOSE METER 2.0) w/Device KIT     . budesonide-formoterol (SYMBICORT) 160-4.5 MCG/ACT inhaler Inhale into the lungs.    . cetirizine (ZYRTEC) 10 MG tablet     . cyclobenzaprine (FLEXERIL) 5 MG tablet Take by mouth.    . diclofenac (VOLTAREN) 75 MG EC tablet   1  . dicyclomine (BENTYL) 20 MG tablet Take by mouth.    . diltiazem (DILACOR XR) 120 MG 24 hr capsule Take by mouth.    . estradiol (ESTRACE) 0.5 MG tablet   5  . ezetimibe (ZETIA) 10  MG tablet Take 10 mg by mouth daily.     . furosemide (LASIX) 20 MG tablet Take by mouth. Reported on 07/29/2015    . gabapentin (NEURONTIN) 400 MG capsule 400 mg.    . glucose blood (ONE TOUCH ULTRA TEST) test strip USE THREE TIMES A DAY. USE AS INSTRUCTED    . Lancets Misc. (UNISTIK 2 NORMAL) MISC 1 each.    Marland Kitchen LEVEMIR FLEXTOUCH 100 UNIT/ML Pen ADM 36 UNI [place] Q NIGHT  5  . levothyroxine (SYNTHROID, LEVOTHROID) 150 MCG tablet Take 150 mcg by mouth daily before breakfast.     . montelukast (SINGULAIR) 10 MG tablet Take 10 mg by mouth at bedtime.    Marland Kitchen  omeprazole (PRILOSEC) 20 MG capsule Take by mouth.    [name] DELICA LANCETS 01U MISC USE THREE TIMES A DAY. USE AS INSTRUCTED    . pregabalin (LYRICA) 75 MG capsule Take 75 mg by mouth 2 (two) times daily.    . rosuvastatin (CRESTOR) 10 MG tablet TAKE 1 TABLET DAILY    . Semaglutide, 1 MG/DOSE, 2 MG/1.5ML SOPN Inject into the skin.    Marland Kitchen telmisartan (MICARDIS) 80 MG tablet Take 80 mg by mouth daily.    . valACYclovir (VALTREX) 1000 MG tablet Take 1 tablet (1,000 mg total) by mouth 2 (two) times daily. 60 tablet 1  . albuterol (PROVENTIL HFA;VENTOLIN HFA) 108 (90 Base) MCG/ACT inhaler Inhale into the lungs.    . Fluticasone-Salmeterol (ADVAIR) 250-50 MCG/DOSE AEPB Inhale 1 puff into the lungs.      No current facility-administered medications for this visit.     OBJECTIVE: BP (!) 144/86 (BP Location: Left Arm, Patient Position: Sitting)   Pulse 86   Temp 97.8 F (36.6 C) (Tympanic)   Ht 6' (1.829 m)   Wt 228 lb (103.4 kg)   BMI 30.92 kg/m    Body mass index is 30.92 kg/m.    ECOG FS:1 - Symptomatic but completely ambulatory  General: Well-developed, well-nourished, no acute distress. Eyes: Pink conjunctiva, anicteric sclera. HEENT: Normocephalic, moist mucous membranes. Lungs: Clear to auscultation bilaterally. Heart: Regular rate and rhythm. No rubs, murmurs, or gallops. Abdomen: Soft, nontender, nondistended. No organomegaly noted, normoactive bowel sounds. Musculoskeletal: No edema, cyanosis, or clubbing. Neuro: Alert, answering all questions appropriately. Cranial nerves grossly intact. Skin: No rashes or petechiae noted. Psych: Normal affect. Lymphatics: No cervical, calvicular, axillary or inguinal LAD. LAB RESULTS:  Appointment on 01/19/2019  Component Date Value Ref Range Status  . WBC 01/19/2019 6.4  4.0 - 10.5 K/uL Final  . RBC 01/19/2019 4.48  3.87 - 5.11 MIL/uL Final  . Hemoglobin 01/19/2019 11.5* 12.0 - 15.0 g/dL Final  . HCT 01/19/2019 36.6  36.0 - 46.0 %  Final  . MCV 01/19/2019 81.7  80.0 - 100.0 fL Final  . MCH 01/19/2019 25.7* 26.0 - 34.0 pg Final  . MCHC 01/19/2019 31.4  30.0 - 36.0 g/dL Final  . RDW 01/19/2019 16.6* 11.5 - 15.5 % Final  . Platelets 01/19/2019 293  150 - 400 K/uL Final  . nRBC 01/19/2019 0.0  0.0 - 0.2 % Final  . Neutrophils Relative % 01/19/2019 63  % Final  . Neutro Abs 01/19/2019 4.1  1.7 - 7.7 K/uL Final  . Lymphocytes Relative 01/19/2019 27  % Final  . Lymphs Abs 01/19/2019 1.7  0.7 - 4.0 K/uL Final  . Monocytes Relative 01/19/2019 7  % Final  . Monocytes Absolute 01/19/2019 0.4  0.1 - 1.0 K/uL Final  . Eosinophils  Relative 01/19/2019 2  % Final  . Eosinophils Absolute 01/19/2019 0.1  0.0 - 0.5 K/uL Final  . Basophils Relative 01/19/2019 1  % Final  . Basophils Absolute 01/19/2019 0.1  0.0 - 0.1 K/uL Final  . Immature Granulocytes 01/19/2019 0  % Final  . Abs Immature Granulocytes 01/19/2019 0.02  0.00 - 0.07 K/uL Final   Performed at [facility], [address] [place], [place] [address]    STUDIES: No results found.  ASSESSMENT: CLL  PLAN:   1. CLL: No evidence of disease.  Patient's white blood cell count continues to be within normal limits with a normal differential. CT scan results from April 13, 2016 were reviewed independently with no evidence of disease. Patient does not require additional treatment at this time. Patient has also requested no further CTs be done unless there is consideration of reinitiation of treatment.  Patient last received treatment with Rituxan and Treanda on April 09, 2015.  If patient needs retreatment, would likely use the same regimen, but patient would need to be heavily pretreated secondary to her previous reaction to Rituxan.  Return to clinic in 6 months for laboratory work only and then in 1 year for laboratory work and routine evaluation. 2. Anxiety: Improved. Patient will require Ativan with any future CT scans. 3. Anemia: Resolved. Patient had a  colonoscopy in February 2017 that was reported as normal.  I spent a total of 20 minutes face-to-face with the patient of which greater than 50% of the visit was spent in counseling and coordination of care as detailed above.   Patient expressed understanding and was in agreement with this plan. She also understands that She can call clinic at any time with any questions, concerns, or complaints.   Lloyd Huger, MD   01/20/2019 8:24 AM

## 2019-01-18 ENCOUNTER — Encounter: Payer: Self-pay | Admitting: Oncology

## 2019-01-18 NOTE — Progress Notes (Signed)
Called patient for tomorrows  follow up.  Patient denies any nausea, vomiting, diarrhea, constipation, decrease in appetite or pain. No new concerns for visit

## 2019-01-19 ENCOUNTER — Inpatient Hospital Stay: Payer: Medicare Other | Attending: Oncology

## 2019-01-19 ENCOUNTER — Inpatient Hospital Stay (HOSPITAL_BASED_OUTPATIENT_CLINIC_OR_DEPARTMENT_OTHER): Payer: Medicare Other | Admitting: Oncology

## 2019-01-19 ENCOUNTER — Other Ambulatory Visit: Payer: Self-pay

## 2019-01-19 VITALS — BP 144/86 | HR 86 | Temp 97.8°F | Ht 72.0 in | Wt 228.0 lb

## 2019-01-19 DIAGNOSIS — Z79899 Other long term (current) drug therapy: Secondary | ICD-10-CM | POA: Insufficient documentation

## 2019-01-19 DIAGNOSIS — I10 Essential (primary) hypertension: Secondary | ICD-10-CM | POA: Diagnosis not present

## 2019-01-19 DIAGNOSIS — Z8719 Personal history of other diseases of the digestive system: Secondary | ICD-10-CM | POA: Diagnosis not present

## 2019-01-19 DIAGNOSIS — C9111 Chronic lymphocytic leukemia of B-cell type in remission: Secondary | ICD-10-CM | POA: Insufficient documentation

## 2019-01-19 DIAGNOSIS — M129 Arthropathy, unspecified: Secondary | ICD-10-CM | POA: Diagnosis not present

## 2019-01-19 DIAGNOSIS — C9110 Chronic lymphocytic leukemia of B-cell type not having achieved remission: Secondary | ICD-10-CM

## 2019-01-19 DIAGNOSIS — Z803 Family history of malignant neoplasm of breast: Secondary | ICD-10-CM | POA: Diagnosis not present

## 2019-01-19 DIAGNOSIS — F418 Other specified anxiety disorders: Secondary | ICD-10-CM | POA: Insufficient documentation

## 2019-01-19 DIAGNOSIS — E119 Type 2 diabetes mellitus without complications: Secondary | ICD-10-CM | POA: Insufficient documentation

## 2019-01-19 DIAGNOSIS — J45909 Unspecified asthma, uncomplicated: Secondary | ICD-10-CM | POA: Diagnosis not present

## 2019-01-19 DIAGNOSIS — E039 Hypothyroidism, unspecified: Secondary | ICD-10-CM | POA: Diagnosis not present

## 2019-01-19 LAB — CBC WITH DIFFERENTIAL/PLATELET
Abs Immature Granulocytes: 0.02 10*3/uL (ref 0.00–0.07)
Basophils Absolute: 0.1 10*3/uL (ref 0.0–0.1)
Basophils Relative: 1 %
Eosinophils Absolute: 0.1 10*3/uL (ref 0.0–0.5)
Eosinophils Relative: 2 %
HCT: 36.6 % (ref 36.0–46.0)
Hemoglobin: 11.5 g/dL — ABNORMAL LOW (ref 12.0–15.0)
Immature Granulocytes: 0 %
Lymphocytes Relative: 27 %
Lymphs Abs: 1.7 10*3/uL (ref 0.7–4.0)
MCH: 25.7 pg — ABNORMAL LOW (ref 26.0–34.0)
MCHC: 31.4 g/dL (ref 30.0–36.0)
MCV: 81.7 fL (ref 80.0–100.0)
Monocytes Absolute: 0.4 10*3/uL (ref 0.1–1.0)
Monocytes Relative: 7 %
Neutro Abs: 4.1 10*3/uL (ref 1.7–7.7)
Neutrophils Relative %: 63 %
Platelets: 293 10*3/uL (ref 150–400)
RBC: 4.48 MIL/uL (ref 3.87–5.11)
RDW: 16.6 % — ABNORMAL HIGH (ref 11.5–15.5)
WBC: 6.4 10*3/uL (ref 4.0–10.5)
nRBC: 0 % (ref 0.0–0.2)

## 2019-01-30 ENCOUNTER — Ambulatory Visit: Payer: Self-pay | Admitting: Oncology

## 2019-01-30 ENCOUNTER — Other Ambulatory Visit: Payer: Self-pay

## 2019-02-12 NOTE — Progress Notes (Signed)
PCP: System, Provider Not In   Chief Complaint  Patient presents with  . Gynecologic Exam  . Vaginal Burning    no discharge/odor x 3 days   MEDICARE PAP/BREAST EXAM  HPI:      [name] is a 53 y.o. No obstetric history on file. who LMP was No LMP recorded. Patient has had a hysterectomy., presents today for her annual examination.  Her menses are absent due to supracx hyst with BSO due to [place] in 2009. She does not have postmenopausal bleeding. She does have vasomotor sx, improved with estradiol, managed by endocrine.  Sex activity: not sexually active. She does not have vaginal dryness. Hx of BV in past. Has had vaginal burning for 3 days without d/c or odor. Sx started last wk with d/c and irritation and treated with diflucan with sx relief, except that's when burning started. No fishy odor. On DM meds that causes yeast vag sx for pt. No prior abx use.   Last Pap: January 03, 2015  Results were: no abnormalities   Last mammogram: December 06, 2017  Results were: normal--routine follow-up in 12 months There is no FH of breast cancer. There is no FH of ovarian cancer. The patient does do self-breast exams.  Colonoscopy: 2017  Repeat due after 5 or 10 years (pt unsure).   Tobacco use: The patient denies current or previous tobacco use. Alcohol use: none  No drug use. Exercise: moderately active  She does get adequate calcium and Vitamin D in her diet.  Labs with PCP.   Patient Active Problem List   Diagnosis Date Noted  . Bacterial vaginosis 11/01/2016  . Chronic pain in right shoulder 03/23/2016  . Numbness and tingling 03/23/2016  . Uncontrolled type 2 diabetes mellitus with hyperglycemia, without long-term current use of insulin ([place]) 10/27/2015  . DDD (degenerative disc disease), cervical 07/29/2015  . Cervical disc disorder with radiculopathy of cervical region 07/29/2015  . Cervical facet syndrome 07/29/2015  . DDD (degenerative disc disease), lumbar  07/29/2015  . Facet syndrome, lumbar 07/29/2015  . Bilateral occipital neuralgia 07/29/2015  . Sacroiliac joint dysfunction 07/29/2015  . DJD of shoulder 07/29/2015  . Herpes simplex vulvovaginitis 05/29/2015  . Acute hemorrhoid 03/11/2015  . CLL (chronic lymphocytic leukemia) ([place]) 01/15/2015  . Acquired hypothyroidism 12/26/2014  . Arthritis 12/26/2014  . Carpal tunnel syndrome, bilateral 12/26/2014  . Controlled type 2 diabetes mellitus without complication, without long-term current use of insulin ([place]) 12/26/2014  . Essential hypertension 12/26/2014  . Hyperlipidemia, mixed 12/26/2014    Past Surgical History:  Procedure Laterality Date  . BILATERAL SALPINGOOPHORECTOMY  2009  . BREAST BIOPSY Right 05/17/2016   FIBROADENOMATOUS CHANGE AND SCLEROSING ADENOSIS WITH  . COLONOSCOPY WITH PROPOFOL N/A 06/16/2015   Procedure: COLONOSCOPY WITH PROPOFOL;  Surgeon: [name], MD;  Location: [facility] ENDOSCOPY;  Service: Endoscopy;  Laterality: N/A;  . LAPAROSCOPIC SUPRACERVICAL HYSTERECTOMY  2009   due to [place]    Family History  Problem Relation Age of Onset  . Cancer Paternal Aunt   . Breast cancer Maternal Aunt 70    Social History   Socioeconomic History  . Marital status: Single    Spouse name: Not on file  . Number of children: Not on file  . Years of education: Not on file  . Highest education level: Not on file  Occupational History  . Not on file  Social Needs  . Financial resource strain: Not on file  . Food insecurity  Worry: Not on file    Inability: Not on file  . Transportation needs    Medical: Not on file    Non-medical: Not on file  Tobacco Use  . Smoking status: Never Smoker  . Smokeless tobacco: Never Used  Substance and Sexual Activity  . Alcohol use: Yes    Alcohol/week: 1.0 standard drinks    Types: 1 Cans of beer per week  . Drug use: No  . Sexual activity: Not Currently    Birth control/protection: None  Lifestyle  . Physical  activity    Days per week: Not on file    Minutes per session: Not on file  . Stress: Not on file  Relationships  . Social Herbalist on phone: Not on file    Gets together: Not on file    Attends religious service: Not on file    Active member of club or organization: Not on file    Attends meetings of clubs or organizations: Not on file    Relationship status: Not on file  . Intimate partner violence    Fear of current or ex partner: Not on file    Emotionally abused: Not on file    Physically abused: Not on file    Forced sexual activity: Not on file  Other Topics Concern  . Not on file  Social History Narrative  . Not on file     Current Outpatient Medications:  .  Blood Glucose Monitoring Suppl (FIFTY50 GLUCOSE METER 2.0) w/Device KIT, , Disp: , Rfl:  .  budesonide-formoterol (SYMBICORT) 160-4.5 MCG/ACT inhaler, Inhale into the lungs., Disp: , Rfl:  .  cetirizine (ZYRTEC) 10 MG tablet, , Disp: , Rfl:  .  diltiazem (DILACOR XR) 120 MG 24 hr capsule, Take by mouth., Disp: , Rfl:  .  estradiol (ESTRACE) 0.5 MG tablet, , Disp: , Rfl: 5 .  ezetimibe (ZETIA) 10 MG tablet, Take 10 mg by mouth daily. , Disp: , Rfl:  .  furosemide (LASIX) 20 MG tablet, Take by mouth. Reported on 07/29/2015, Disp: , Rfl:  .  gabapentin (NEURONTIN) 400 MG capsule, 400 mg., Disp: , Rfl:  .  glucose blood (ONE TOUCH ULTRA TEST) test strip, USE THREE TIMES A DAY. USE AS INSTRUCTED, Disp: , Rfl:  .  insulin aspart (NOVOLOG FLEXPEN) 100 UNIT/ML FlexPen, Inject 10 units before supper. Aim to take 10-15 minutes before eating., Disp: , Rfl:  .  Insulin Detemir (LEVEMIR FLEXTOUCH) 100 UNIT/ML Pen, Inject 42 units nightly. Take between 8 - 10 PM., Disp: , Rfl:  .  Insulin Pen Needle (FIFTY50 PEN NEEDLES) 32G X 4 MM MISC, Use two daily as directed, Disp: , Rfl:  .  Lancets Misc. (UNISTIK 2 NORMAL) MISC, 1 each., Disp: , Rfl:  .  levothyroxine (SYNTHROID, LEVOTHROID) 150 MCG tablet, Take 150 mcg by mouth  daily before breakfast. , Disp: , Rfl:  .  montelukast (SINGULAIR) 10 MG tablet, Take 10 mg by mouth at bedtime., Disp: , Rfl:  .  omeprazole (PRILOSEC) 20 MG capsule, Take by mouth., Disp: , Rfl:  .  ONETOUCH DELICA LANCETS 75O MISC, USE THREE TIMES A DAY. USE AS INSTRUCTED, Disp: , Rfl:  .  pregabalin (LYRICA) 75 MG capsule, Take 75 mg by mouth 2 (two) times daily., Disp: , Rfl:  .  rosuvastatin (CRESTOR) 10 MG tablet, TAKE 1 TABLET DAILY, Disp: , Rfl:  .  Semaglutide, 1 MG/DOSE, (OZEMPIC, 1 MG/DOSE,) 2 MG/1.5ML SOPN, INJECT 1MG SUBCUTANEOUS  EVERY 7 DAYS, Disp: , Rfl:  .  telmisartan (MICARDIS) 80 MG tablet, Take 80 mg by mouth daily., Disp: , Rfl:  .  valACYclovir (VALTREX) 1000 MG tablet, Take 1 tablet (1,000 mg total) by mouth 2 (two) times daily., Disp: 60 tablet, Rfl: 1 .  albuterol (PROVENTIL HFA;VENTOLIN HFA) 108 (90 Base) MCG/ACT inhaler, Inhale into the lungs., Disp: , Rfl:  .  fluconazole (DIFLUCAN) 150 MG tablet, Take 1 tablet (150 mg total) by mouth once for 1 dose., Disp: 1 tablet, Rfl: 0 .  Fluticasone-Salmeterol (ADVAIR) 250-50 MCG/DOSE AEPB, Inhale 1 puff into the lungs. , Disp: , Rfl:      ROS:  Review of Systems  Constitutional: Negative for fatigue, fever and unexpected weight change.  Respiratory: Negative for cough, shortness of breath and wheezing.   Cardiovascular: Negative for chest pain, palpitations and leg swelling.  Gastrointestinal: Negative for blood in stool, constipation, diarrhea, nausea and vomiting.  Endocrine: Negative for cold intolerance, heat intolerance and polyuria.  Genitourinary: Positive for vaginal pain. Negative for dyspareunia, dysuria, flank pain, frequency, genital sores, hematuria, menstrual problem, pelvic pain, urgency, vaginal bleeding and vaginal discharge.  Musculoskeletal: Negative for back pain, joint swelling and myalgias.  Skin: Negative for rash.  Neurological: Negative for dizziness, syncope, light-headedness, numbness and  headaches.  Hematological: Negative for adenopathy.  Psychiatric/Behavioral: Negative for agitation, confusion, sleep disturbance and suicidal ideas. The patient is not nervous/anxious.    BREAST: No symptoms    Objective: BP 100/70   Ht 6' (1.829 m)   Wt 227 lb (103 kg)   BMI 30.79 kg/m    Physical Exam Constitutional:      Appearance: She is well-developed.  Genitourinary:     Vulva and cervix normal.     Vaginal discharge present.     No vaginal erythema or tenderness.     Uterus is absent.     No right or left adnexal mass present.     Right adnexa not tender.     Left adnexa not tender.     Genitourinary Comments: UTERUS SURG REM  Neck:     Musculoskeletal: Normal range of motion.     Thyroid: No thyromegaly.  Cardiovascular:     Rate and Rhythm: Normal rate and regular rhythm.     Heart sounds: Normal heart sounds. No murmur.  Pulmonary:     Effort: Pulmonary effort is normal.     Breath sounds: Normal breath sounds.  Chest:     Breasts:        Right: No mass, nipple discharge, skin change or tenderness.        Left: No mass, nipple discharge, skin change or tenderness.  Abdominal:     Palpations: Abdomen is soft.     Tenderness: There is no abdominal tenderness. There is no guarding.  Musculoskeletal: Normal range of motion.  Neurological:     General: No focal deficit present.     Mental Status: She is alert and oriented to person, place, and time.     Cranial Nerves: No cranial nerve deficit.  Skin:    General: Skin is warm and dry.  Psychiatric:        Mood and Affect: Mood normal.        Behavior: Behavior normal.        Thought Content: Thought content normal.        Judgment: Judgment normal.  Vitals signs reviewed.     Results: Results for orders placed or performed in  visit on 02/13/19 (from the past 24 hour(s))  POCT Wet Prep with KOH     Status: Normal   Collection Time: 02/13/19  2:53 PM  Result Value Ref Range   Trichomonas, UA  Negative    Clue Cells Wet Prep HPF POC neg    Epithelial Wet Prep HPF POC     Yeast Wet Prep HPF POC neg    Bacteria Wet Prep HPF POC     RBC Wet Prep HPF POC     WBC Wet Prep HPF POC     KOH Prep POC Negative Negative    Assessment/Plan:  Encounter for annual routine gynecological examination  Cervical cancer screening - Plan: Cytology - PAP  Encounter for screening mammogram for malignant neoplasm of breast - Plan: MM 3D SCREEN BREAST BILATERAL; pt to sched mammo  Vaginal discharge - Plan: POCT Wet Prep with KOH, fluconazole (DIFLUCAN) 150 MG tablet; Neg wet prep/pos d/c on exam. Treat again with diflucan given yeast vag hx due to DM meds. F/u if sx persist.    Meds ordered this encounter  Medications  . fluconazole (DIFLUCAN) 150 MG tablet    Sig: Take 1 tablet (150 mg total) by mouth once for 1 dose.    Dispense:  1 tablet    Refill:  0    Order Specific Question:   Supervising Provider    Answer:   Gae Dry [[id number]]            GYN counsel breast self exam, mammography screening, menopause, adequate intake of calcium and vitamin D, diet and exercise    F/U  Return in about 2 years (around 60/16/5800) for annual.  Tasmia Blumer B. Shunna Mikaelian, PA-C 02/13/2019 2:53 PM

## 2019-02-13 ENCOUNTER — Other Ambulatory Visit: Payer: Self-pay

## 2019-02-13 ENCOUNTER — Other Ambulatory Visit (HOSPITAL_COMMUNITY)
Admission: RE | Admit: 2019-02-13 | Discharge: 2019-02-13 | Disposition: A | Discharge status: Home / Self Care | Payer: Medicare Other | Source: Ambulatory Visit | Attending: Obstetrics and Gynecology | Admitting: Obstetrics and Gynecology

## 2019-02-13 ENCOUNTER — Encounter: Payer: Self-pay | Admitting: Obstetrics and Gynecology

## 2019-02-13 ENCOUNTER — Ambulatory Visit (INDEPENDENT_AMBULATORY_CARE_PROVIDER_SITE_OTHER): Payer: Medicare Other | Admitting: Obstetrics and Gynecology

## 2019-02-13 VITALS — BP 100/70 | Ht 72.0 in | Wt 227.0 lb

## 2019-02-13 DIAGNOSIS — N898 Other specified noninflammatory disorders of vagina: Secondary | ICD-10-CM

## 2019-02-13 DIAGNOSIS — Z124 Encounter for screening for malignant neoplasm of cervix: Secondary | ICD-10-CM | POA: Insufficient documentation

## 2019-02-13 DIAGNOSIS — Z01419 Encounter for gynecological examination (general) (routine) without abnormal findings: Secondary | ICD-10-CM

## 2019-02-13 DIAGNOSIS — Z1231 Encounter for screening mammogram for malignant neoplasm of breast: Secondary | ICD-10-CM

## 2019-02-13 LAB — POCT WET PREP WITH KOH
Clue Cells Wet Prep HPF POC: NEGATIVE
KOH Prep POC: NEGATIVE
Trichomonas, UA: NEGATIVE
Yeast Wet Prep HPF POC: NEGATIVE

## 2019-02-13 MED ORDER — FLUCONAZOLE 150 MG PO TABS: 150.0000 mg | ORAL_TABLET | Freq: Once | ORAL | 0 refills | Status: AC

## 2019-02-13 NOTE — Patient Instructions (Signed)
I value your feedback and entrusting us with your care. If you get a [facility] patient survey, I would appreciate you taking the time to let us know about your experience today. Thank you! 

## 2019-02-15 LAB — CYTOLOGY - PAP: Diagnosis: NEGATIVE

## 2019-02-16 ENCOUNTER — Encounter: Payer: Self-pay | Admitting: Obstetrics and Gynecology

## 2019-02-16 ENCOUNTER — Other Ambulatory Visit: Payer: Self-pay

## 2019-02-16 ENCOUNTER — Ambulatory Visit
Admission: RE | Admit: 2019-02-16 | Discharge: 2019-02-16 | Disposition: A | Discharge status: Home / Self Care | Payer: Medicare Other | Source: Ambulatory Visit | Attending: Obstetrics and Gynecology | Admitting: Obstetrics and Gynecology

## 2019-02-16 DIAGNOSIS — Z20822 Contact with and (suspected) exposure to covid-19: Secondary | ICD-10-CM

## 2019-02-16 DIAGNOSIS — Z1231 Encounter for screening mammogram for malignant neoplasm of breast: Secondary | ICD-10-CM | POA: Diagnosis not present

## 2019-02-16 IMAGING — MG DIGITAL SCREENING BILAT W/ TOMO W/ CAD
8 series · 8 of 24 positions shown · non-contrast
Comparison: Previous exam(s).

CLINICAL DATA: Screening.

EXAM:
DIGITAL SCREENING BILATERAL MAMMOGRAM WITH TOMO AND CAD

[L MLO synth-2D]
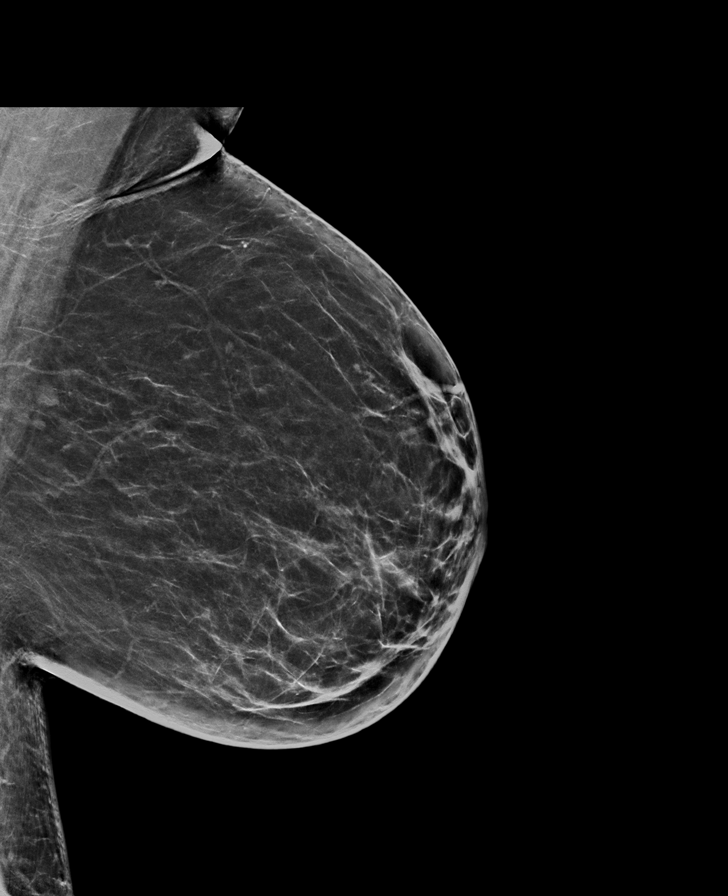

[R MLO synth-2D]
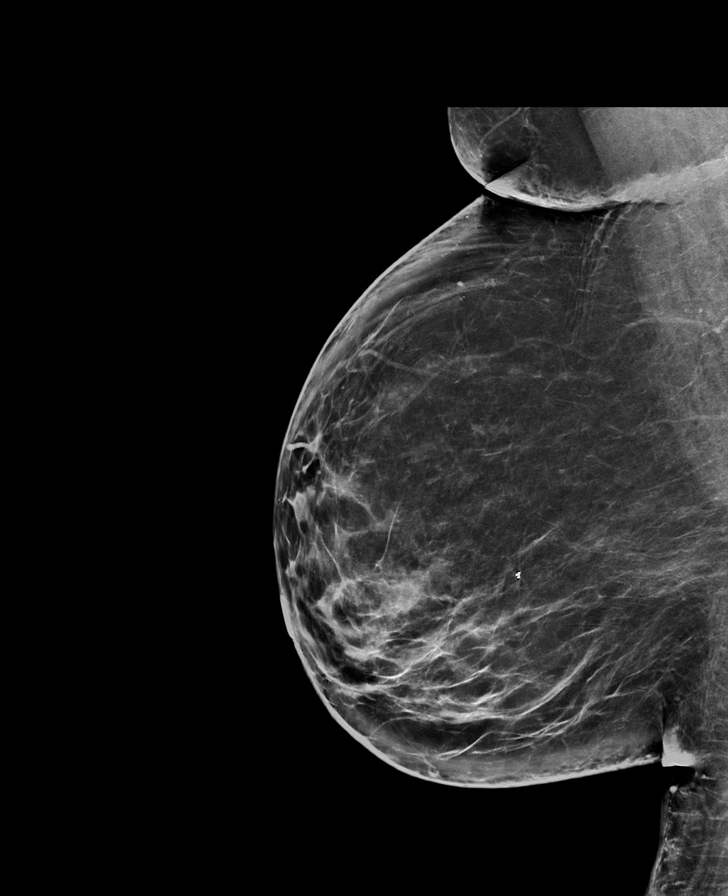

[L CC synth-2D]
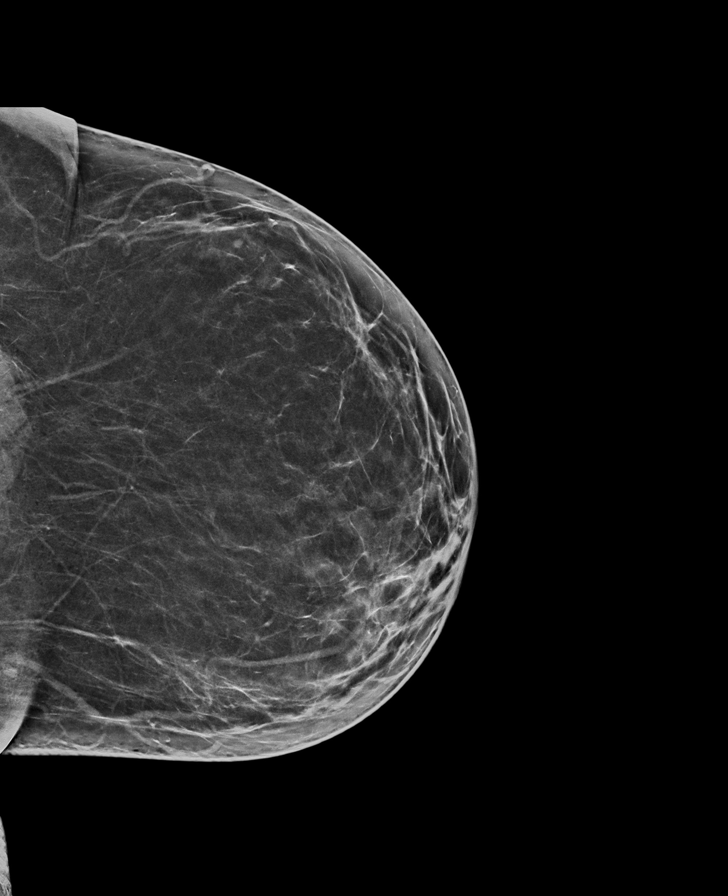

[R CC synth-2D]
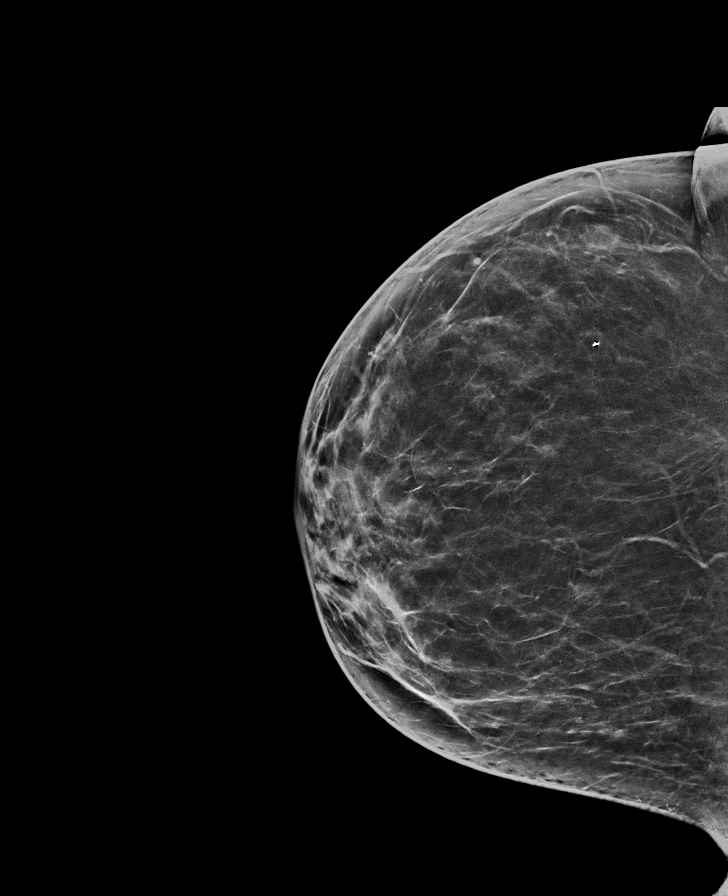

[R CC tomo · tomo slice 37/73.0]
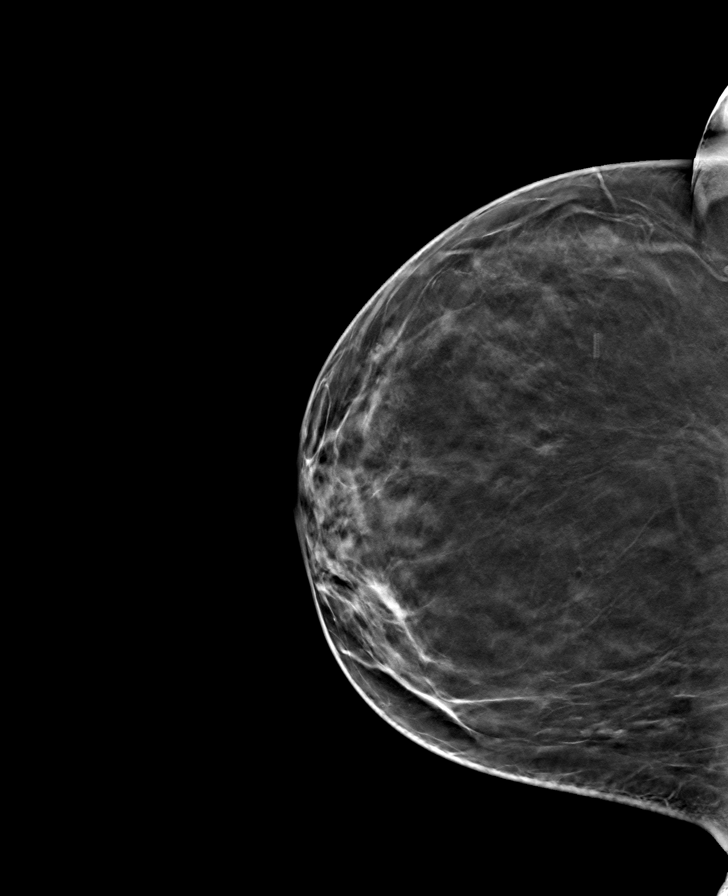

[R MLO tomo · tomo slice 43/86.0]
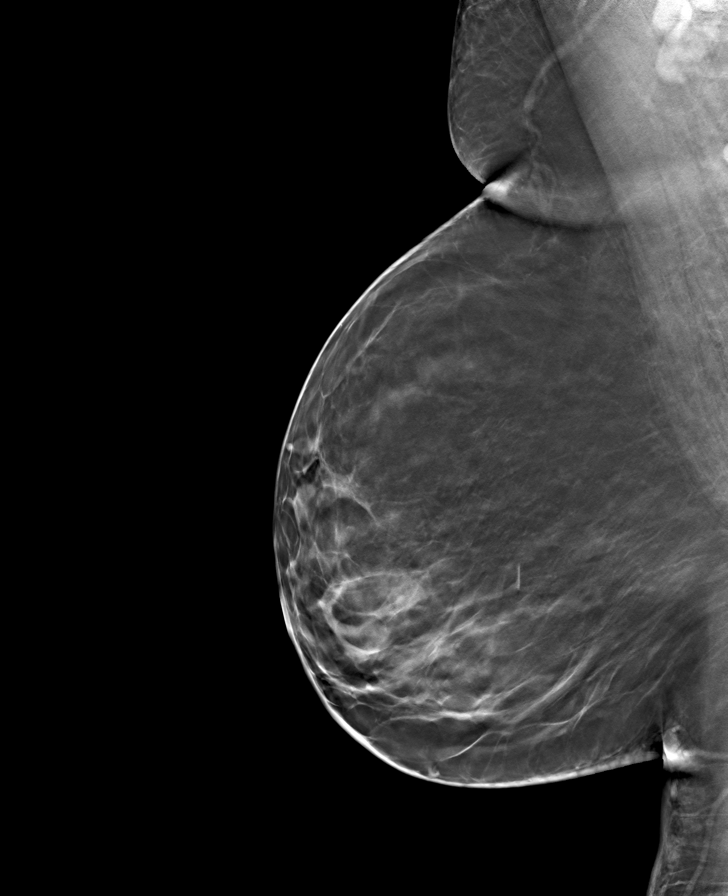

[L CC tomo · tomo slice 37/72.0]
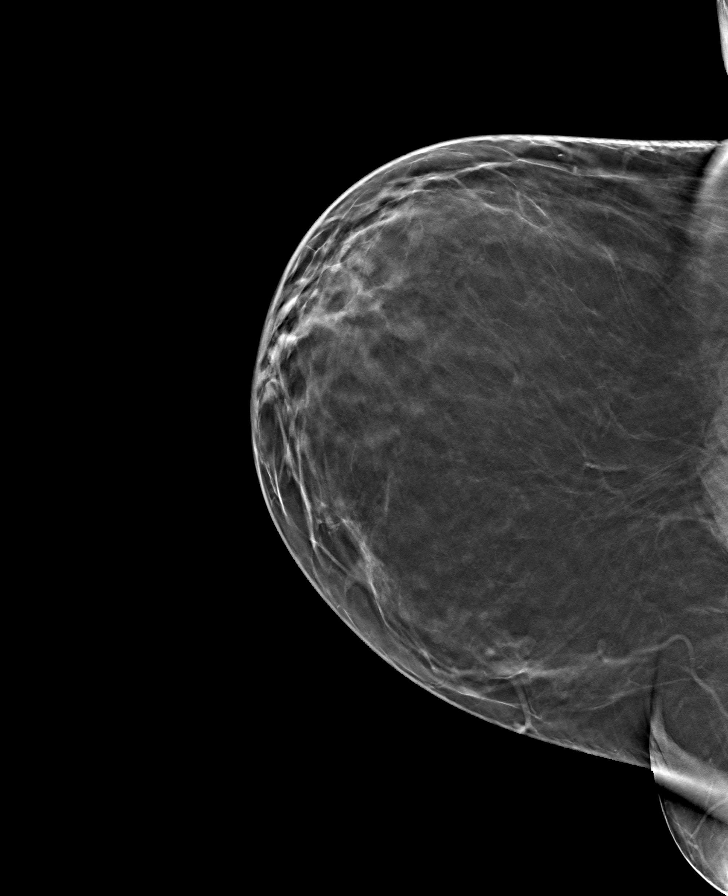

[L MLO tomo · tomo slice 39/76.0]
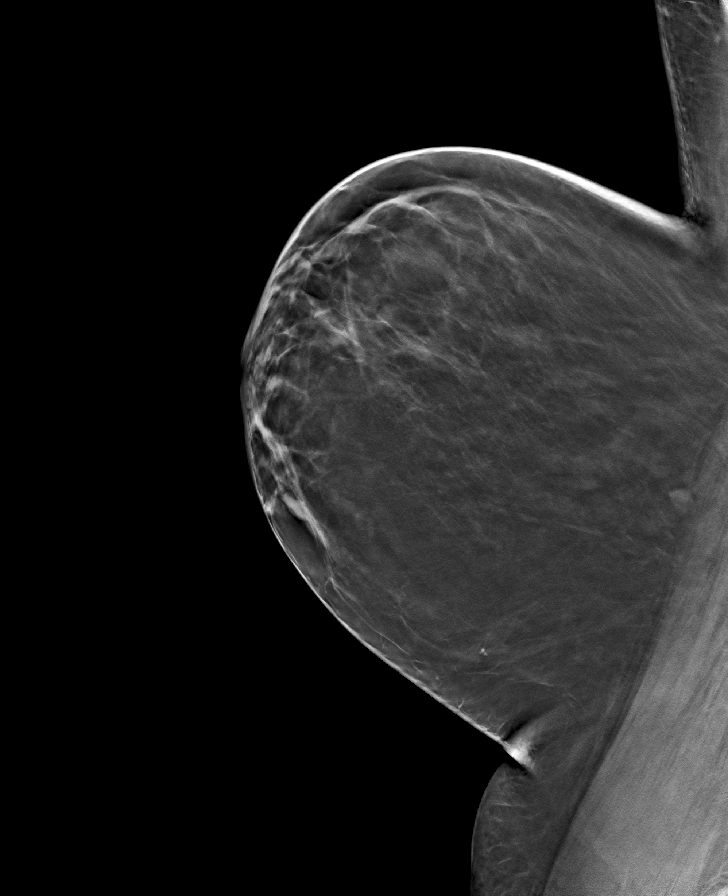

[8 of 24 positions shown; findings below may reference images not displayed]

ACR Breast Density Category b: There are scattered areas of
fibroglandular density.
FINDINGS: There are no findings suspicious for malignancy. Images were
processed with CAD.
IMPRESSION: No mammographic evidence of malignancy. A result letter of this
screening mammogram will be mailed directly to the patient.

RECOMMENDATION:
Screening mammogram in one year. (Code:[TQ])

BI-RADS CATEGORY  1: Negative.

## 2019-02-18 LAB — NOVEL CORONAVIRUS, NAA: SARS-CoV-2, NAA: NOT DETECTED

## 2019-03-15 ENCOUNTER — Ambulatory Visit: Payer: Medicare Other

## 2019-03-15 ENCOUNTER — Telehealth: Payer: Self-pay | Admitting: *Deleted

## 2019-03-15 ENCOUNTER — Ambulatory Visit
Admission: EM | Admit: 2019-03-15 | Discharge: 2019-03-15 | Disposition: A | Discharge status: Home / Self Care | Payer: Medicare Other | Attending: Family Medicine | Admitting: Family Medicine

## 2019-03-15 ENCOUNTER — Other Ambulatory Visit: Payer: Self-pay

## 2019-03-15 ENCOUNTER — Encounter: Payer: Self-pay | Admitting: Emergency Medicine

## 2019-03-15 DIAGNOSIS — R05 Cough: Secondary | ICD-10-CM | POA: Diagnosis not present

## 2019-03-15 DIAGNOSIS — R0602 Shortness of breath: Secondary | ICD-10-CM | POA: Diagnosis not present

## 2019-03-15 DIAGNOSIS — C9110 Chronic lymphocytic leukemia of B-cell type not having achieved remission: Secondary | ICD-10-CM | POA: Diagnosis not present

## 2019-03-15 DIAGNOSIS — R059 Cough, unspecified: Secondary | ICD-10-CM

## 2019-03-15 DIAGNOSIS — Z20828 Contact with and (suspected) exposure to other viral communicable diseases: Secondary | ICD-10-CM | POA: Diagnosis not present

## 2019-03-15 DIAGNOSIS — Z20822 Contact with and (suspected) exposure to covid-19: Secondary | ICD-10-CM

## 2019-03-15 LAB — SARS CORONAVIRUS 2 AG (30 MIN TAT): SARS Coronavirus 2 Ag: NEGATIVE

## 2019-03-15 IMAGING — CR DG CHEST 2V
2 series · 2 of 2 positions shown · non-contrast
Comparison: [DATE]

CLINICAL DATA: Cough and shortness of breath

EXAM:
CHEST - 2 VIEW

[chest pa]
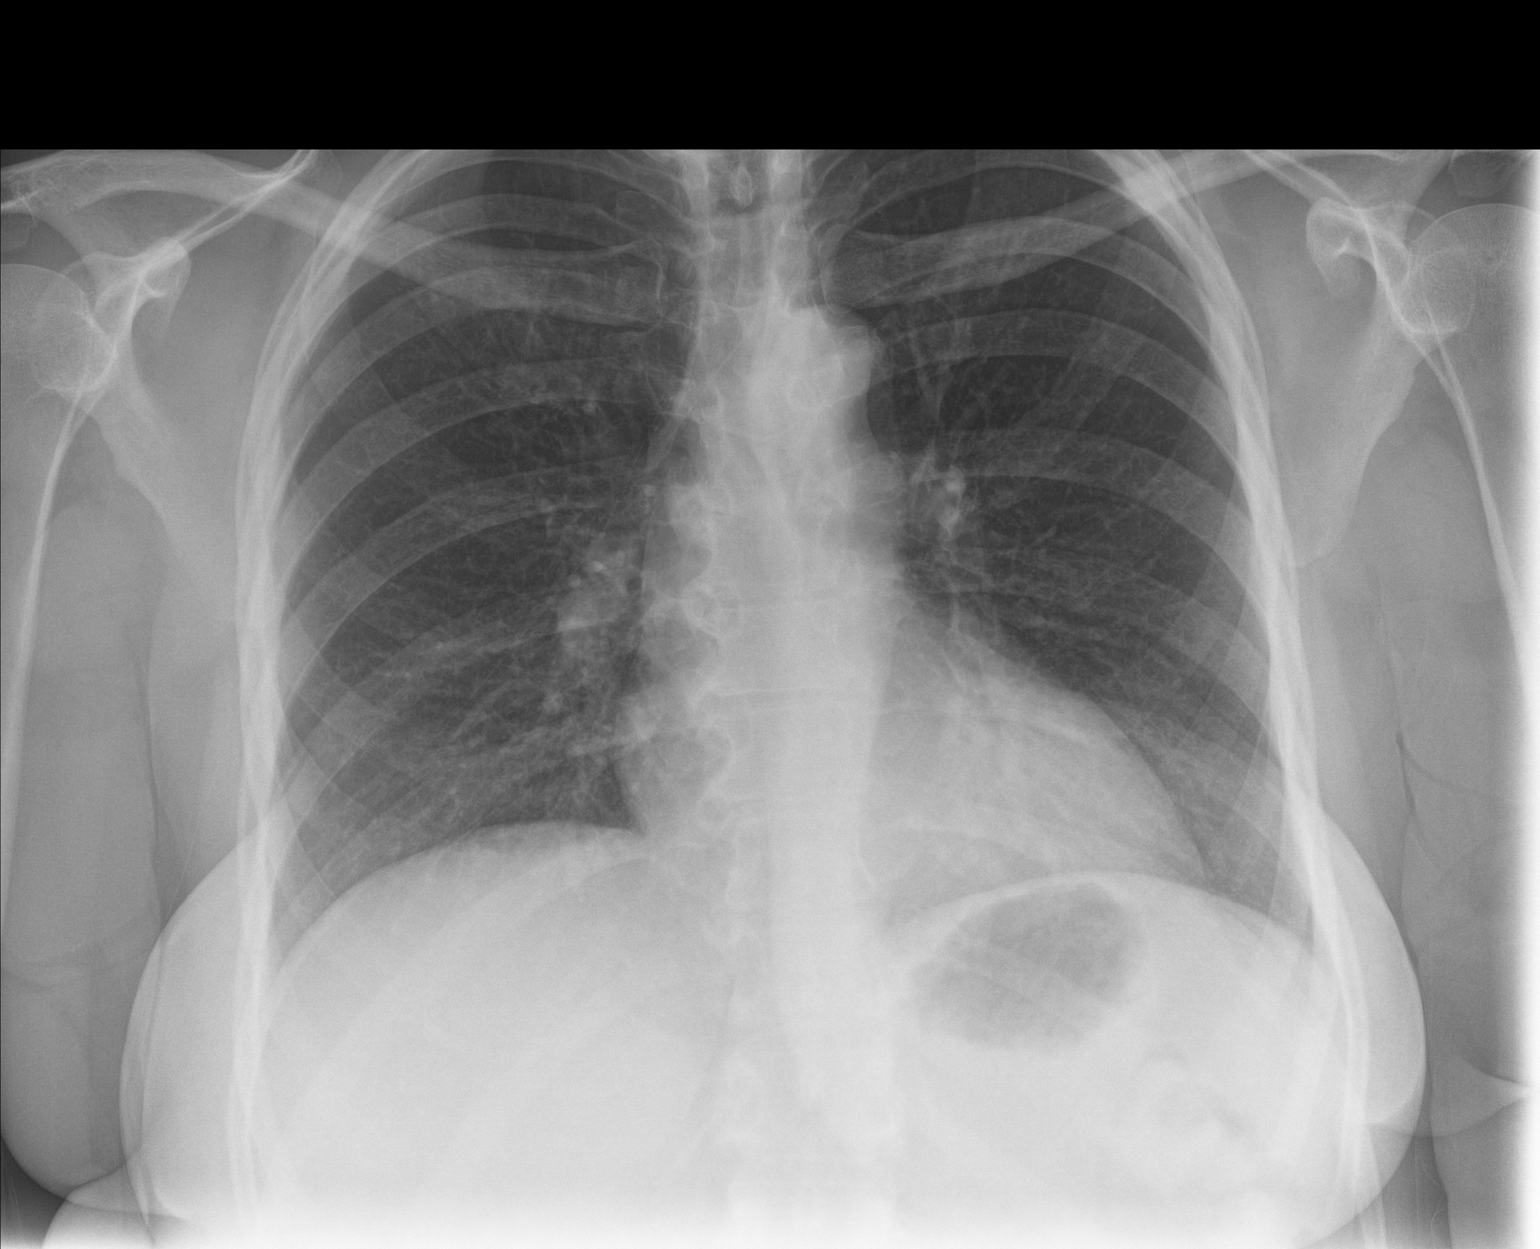

[chest lat]
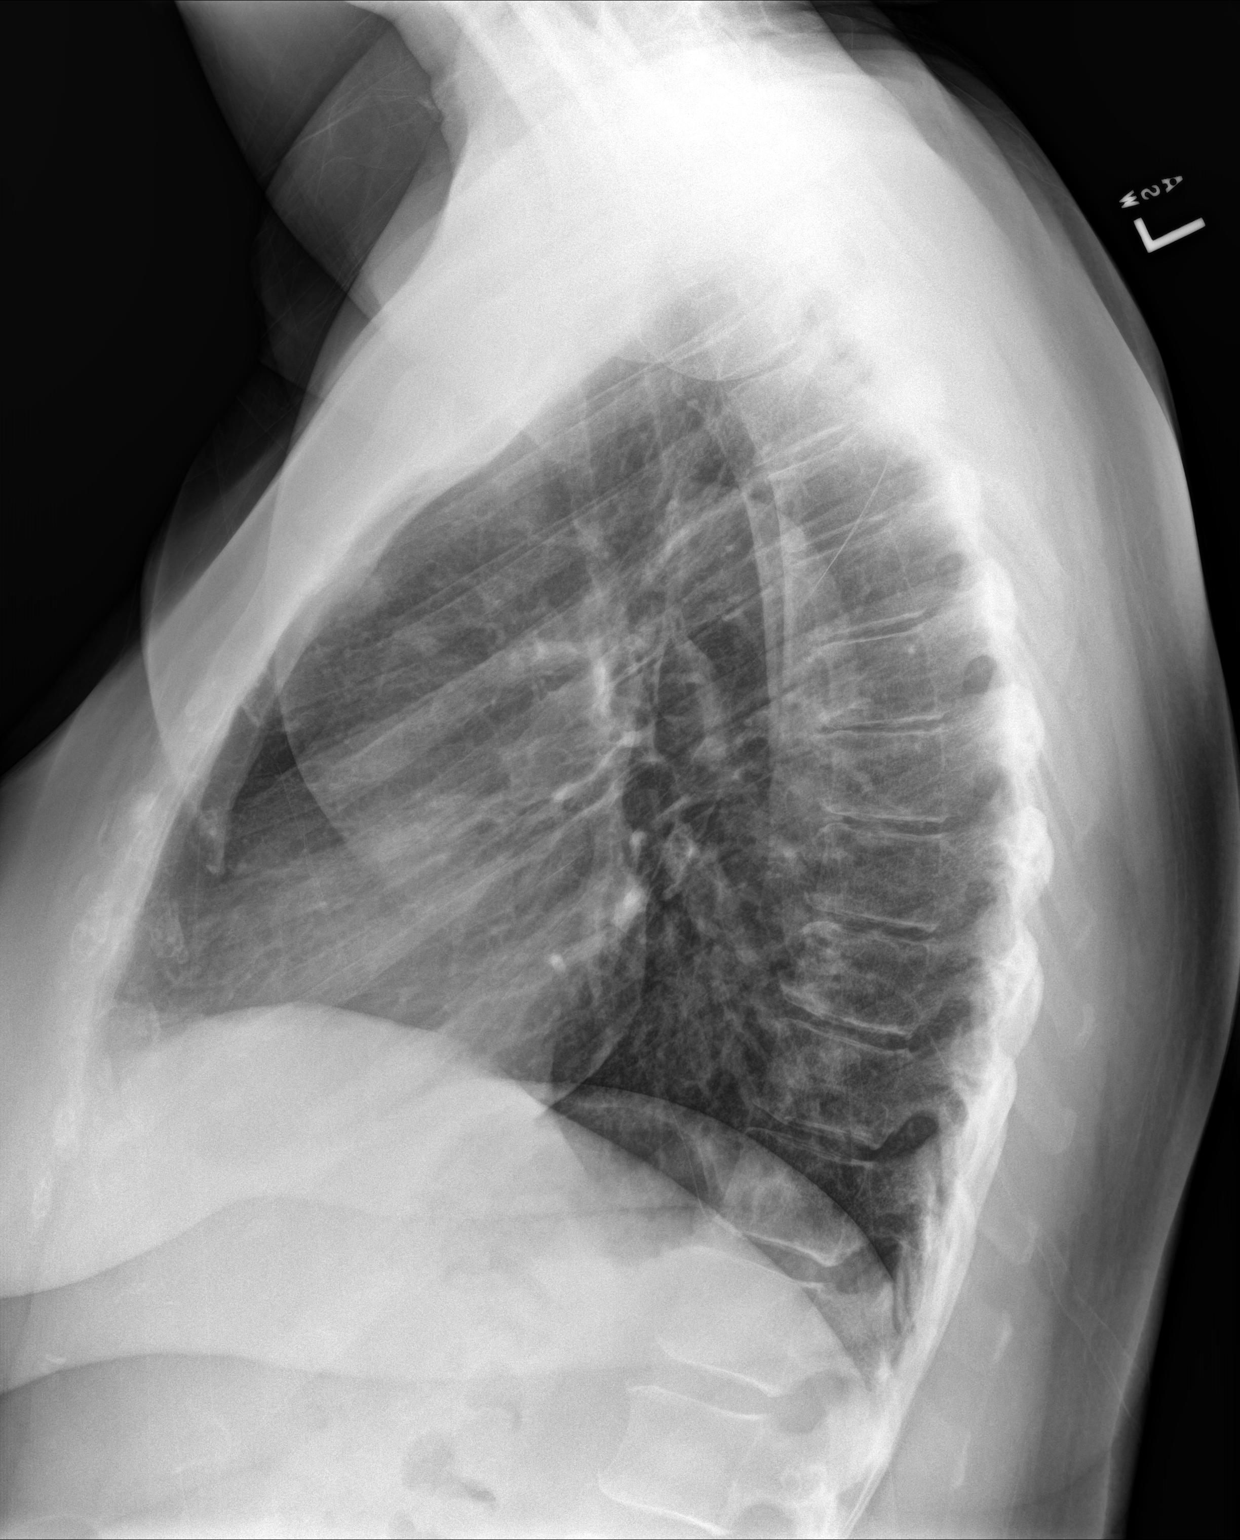

[2 of 2 positions shown; findings below may reference images not displayed]

FINDINGS: Lungs are clear. Heart size and pulmonary vascularity are normal. No
adenopathy. No pneumothorax. No bone lesions.
IMPRESSION: No edema or consolidation.

## 2019-03-15 MED ORDER — ALBUTEROL SULFATE HFA 108 (90 BASE) MCG/ACT IN AERS: 1.0000 | INHALATION_SPRAY | Freq: Four times a day (QID) | RESPIRATORY_TRACT | 1 refills | Status: DC | PRN

## 2019-03-15 MED ORDER — BENZONATATE 100 MG PO CAPS: 100.0000 mg | ORAL_CAPSULE | Freq: Three times a day (TID) | ORAL | 0 refills | Status: DC | PRN

## 2019-03-15 NOTE — Telephone Encounter (Signed)
Patient called asking for location of rapid testing site for COVID, States she is not feeling well and wants to be sure she doe snot have COVID. Please return her call [phone]

## 2019-03-15 NOTE — Telephone Encounter (Signed)
Patient is in ER and is being tested there

## 2019-03-15 NOTE — ED Provider Notes (Signed)
[facility]    CSN: [id number] Arrival date & time: 03/15/19  1256  History   Chief Complaint Chief Complaint  Patient presents with  . Cough   HPI  53 year old female presents with cough.  Patient reports that she has had congestion for the past 2 weeks.  She feels like there is mucus dripping down her throat.  Yesterday she developed cough and shortness of breath.  She got quite anxious as well.  Denies fever.  She is concerned about the possibility of COVID-19.  She has taken over-the-counter medication without resolution.  No known exacerbating factors.  No other associated symptoms.  No other complaints.  PMH, Surgical Hx, Family Hx, Social History reviewed and updated as below.  Past Medical History:  Diagnosis Date  . Acute hemorrhoid 03/11/2015  . Anxiety   . Arthritis   . Asthma   . Cancer ([place])   . Chronic back pain   . CLL (chronic lymphocytic leukemia) ([place])   . Depression   . Diabetes mellitus without complication ([place])   . Genital herpes    type 2  . Hypertension   . Hypothyroidism   . Vertigo     Patient Active Problem List   Diagnosis Date Noted  . Bacterial vaginosis 11/01/2016  . Chronic pain in right shoulder 03/23/2016  . Numbness and tingling 03/23/2016  . Uncontrolled type 2 diabetes mellitus with hyperglycemia, without long-term current use of insulin ([place]) 10/27/2015  . DDD (degenerative disc disease), cervical 07/29/2015  . Cervical disc disorder with radiculopathy of cervical region 07/29/2015  . Cervical facet syndrome 07/29/2015  . DDD (degenerative disc disease), lumbar 07/29/2015  . Facet syndrome, lumbar 07/29/2015  . Bilateral occipital neuralgia 07/29/2015  . Sacroiliac joint dysfunction 07/29/2015  . DJD of shoulder 07/29/2015  . Herpes simplex vulvovaginitis 05/29/2015  . Acute hemorrhoid 03/11/2015  . CLL (chronic lymphocytic leukemia) ([place]) 01/15/2015  . Acquired hypothyroidism 12/26/2014  . Arthritis 12/26/2014   . Carpal tunnel syndrome, bilateral 12/26/2014  . Controlled type 2 diabetes mellitus without complication, without long-term current use of insulin ([place]) 12/26/2014  . Essential hypertension 12/26/2014  . Hyperlipidemia, mixed 12/26/2014    Past Surgical History:  Procedure Laterality Date  . ABDOMINAL HYSTERECTOMY    . BILATERAL SALPINGOOPHORECTOMY  2009  . BREAST BIOPSY Right 05/17/2016   FIBROADENOMATOUS CHANGE AND SCLEROSING ADENOSIS WITH  . COLONOSCOPY WITH PROPOFOL N/A 06/16/2015   Procedure: COLONOSCOPY WITH PROPOFOL;  Surgeon: [name], MD;  Location: [facility] ENDOSCOPY;  Service: Endoscopy;  Laterality: N/A;  . LAPAROSCOPIC SUPRACERVICAL HYSTERECTOMY  2009   due to [place]  . OOPHORECTOMY      OB History   No obstetric history on file.      Home Medications    Prior to Admission medications   Medication Sig Start Date End Date Taking? Authorizing Provider  cetirizine (ZYRTEC) 10 MG tablet  09/27/18  Yes [provider]  diltiazem (DILACOR XR) 120 MG 24 hr capsule Take by mouth. 12/26/14  Yes [provider]  estradiol (ESTRACE) 0.5 MG tablet  03/14/18  Yes [provider]  ezetimibe (ZETIA) 10 MG tablet Take 10 mg by mouth daily.    Yes [provider]  furosemide (LASIX) 20 MG tablet Take by mouth. Reported on 07/29/2015   Yes [provider]  gabapentin (NEURONTIN) 400 MG capsule 400 mg. 08/28/18  Yes [provider]  glucose blood (ONE TOUCH ULTRA TEST) test strip USE THREE TIMES A DAY. USE AS  INSTRUCTED 04/06/16  Yes [provider]  insulin aspart (NOVOLOG FLEXPEN) 100 UNIT/ML FlexPen Inject 10 units before supper. Aim to take 10-15 minutes before eating. 01/26/19  Yes [provider]  Insulin Detemir (LEVEMIR FLEXTOUCH) 100 UNIT/ML Pen Inject 42 units nightly. Take between 8 - 10 PM. 01/26/19  Yes [provider]  levothyroxine (SYNTHROID, LEVOTHROID) 150 MCG tablet Take 150 mcg by mouth  daily before breakfast.    Yes [provider]  montelukast (SINGULAIR) 10 MG tablet Take 10 mg by mouth at bedtime.   Yes [provider]  omeprazole (PRILOSEC) 20 MG capsule Take by mouth. 08/17/18 08/17/19 Yes [provider]  pregabalin (LYRICA) 75 MG capsule Take 75 mg by mouth 2 (two) times daily.   Yes [provider]  rosuvastatin (CRESTOR) 10 MG tablet TAKE 1 TABLET DAILY 09/16/17  Yes [provider]  Semaglutide, 1 MG/DOSE, (OZEMPIC, 1 MG/DOSE,) 2 MG/1.5ML SOPN INJECT 1MG SUBCUTANEOUS EVERY 7 DAYS 01/16/19  Yes [provider]  telmisartan (MICARDIS) 80 MG tablet Take 80 mg by mouth daily.   Yes [provider]  valACYclovir (VALTREX) 1000 MG tablet Take 1 tablet (1,000 mg total) by mouth 2 (two) times daily. 07/20/18  Yes [name], MD  budesonide-formoterol (SYMBICORT) 160-4.5 MCG/ACT inhaler Inhale into the lungs. 07/14/18 03/15/19 Yes [provider]  albuterol (VENTOLIN HFA) 108 (90 Base) MCG/ACT inhaler Inhale 1 puff into the lungs every 6 (six) hours as needed for wheezing or shortness of breath. 03/15/19 03/14/20  [name], DO  benzonatate (TESSALON) 100 MG capsule Take 1 capsule (100 mg total) by mouth 3 (three) times daily as needed. 03/15/19   [name], DO  Blood Glucose Monitoring Suppl (FIFTY50 GLUCOSE METER 2.0) w/Device KIT  02/25/15   [provider]  Fluticasone-Salmeterol (ADVAIR) 250-50 MCG/DOSE AEPB Inhale 1 puff into the lungs.  11/03/17 11/03/18  [provider]  Insulin Pen Needle (FIFTY50 PEN NEEDLES) 32G X 4 MM MISC Use two daily as directed 01/26/19   [provider]  Lancets Misc. (UNISTIK 2 NORMAL) MISC 1 each. 02/25/15   [provider]  Jonetta Speak LANCETS 48G MISC USE THREE TIMES A DAY. USE AS INSTRUCTED 04/06/16   [provider]    Family History Family History  Problem Relation Age of Onset  . Cancer Paternal Aunt   .  Breast cancer Maternal Aunt 70    Social History Social History   Tobacco Use  . Smoking status: Never Smoker  . Smokeless tobacco: Never Used  Substance Use Topics  . Alcohol use: Yes    Alcohol/week: 1.0 standard drinks    Types: 1 Cans of beer per week  . Drug use: No     Allergies   Patient has no known allergies.   Review of Systems Review of Systems  Constitutional: Negative for fever.  HENT: Positive for congestion.   Respiratory: Positive for cough and shortness of breath.    Physical Exam Triage Vital Signs ED Triage Vitals  Enc Vitals Group     BP 03/15/19 1344 (!) 139/91     Pulse Rate 03/15/19 1344 85     Resp 03/15/19 1344 18     Temp 03/15/19 1344 99.1 F (37.3 C)     Temp Source 03/15/19 1344 Oral     SpO2 03/15/19 1344 98 %     Weight 03/15/19 1345 228 lb (103.4 kg)     Height 03/15/19 1345 6' (1.829 m)  Head Circumference --      Peak Flow --      Pain Score 03/15/19 1345 7     Pain Loc --      Pain Edu? --      Excl. in [place]? --    No data found.  Updated Vital Signs BP (!) 139/91 (BP Location: Right Arm)   Pulse 85   Temp 99.1 F (37.3 C) (Oral)   Resp 18   Ht 6' (1.829 m)   Wt 103.4 kg   SpO2 98%   BMI 30.92 kg/m   Visual Acuity Right Eye Distance:   Left Eye Distance:   Bilateral Distance:    Right Eye Near:   Left Eye Near:    Bilateral Near:     Physical Exam Vitals signs and nursing note reviewed.  Constitutional:      General: She is not in acute distress.    Appearance: Normal appearance. She is not ill-appearing.  HENT:     Head: Normocephalic and atraumatic.     Mouth/Throat:     Pharynx: Oropharynx is clear. No posterior oropharyngeal erythema.      Comments: Abnormal appearing mass noted at labelled location. Patient states that she has had this for years. Eyes:     General:        Right eye: No discharge.        Left eye: No discharge.     Conjunctiva/sclera: Conjunctivae normal.  Cardiovascular:      Rate and Rhythm: Normal rate and regular rhythm.     Heart sounds: No murmur.  Pulmonary:     Effort: Pulmonary effort is normal.     Breath sounds: Normal breath sounds. No wheezing, rhonchi or rales.  Neurological:     Mental Status: She is alert.  Psychiatric:        Mood and Affect: Mood normal.        Behavior: Behavior normal.    UC Treatments / Results  Labs (all labs ordered are listed, but only abnormal results are displayed) Labs Reviewed  SARS CORONAVIRUS 2 AG (30 MIN TAT)    EKG   Radiology Dg Chest 2 View  Result Date: 03/15/2019 CLINICAL DATA:  Cough and shortness of breath EXAM: CHEST - 2 VIEW COMPARISON:  March 02, 2017 FINDINGS: Lungs are clear. Heart size and pulmonary vascularity are normal. No adenopathy. No pneumothorax. No bone lesions. IMPRESSION: No edema or consolidation. Electronically Signed   By: Lowella Grip III M.D.   On: 03/15/2019 14:26    Procedures Procedures (including critical care time)  Medications Ordered in UC Medications - No data to display  Initial Impression / Assessment and Plan / UC Course  I have reviewed the triage vital signs and the nursing notes.  Pertinent labs & imaging results that were available during my care of the patient were reviewed by me and considered in my medical decision making (see chart for details).    53 year old female presents with cough and shortness of breath.  Rapid Covid test negative today.  Patient is well-appearing.  Advised albuterol and Tessalon Perles.  Supportive care.  Final Clinical Impressions(s) / UC Diagnoses   Final diagnoses:  Encounter for laboratory testing for COVID-19 virus  Cough     Discharge Instructions     Albuterol as needed.  Continue antihistamine.  Chest xray and COVID negative.  Take care  Dr. Lacinda Axon    ED Prescriptions    Medication Sig Dispense Auth. Provider  albuterol (VENTOLIN HFA) 108 (90 Base) MCG/ACT inhaler Inhale 1 puff into the  lungs every 6 (six) hours as needed for wheezing or shortness of breath. 18 g Luverta Korte G, DO   benzonatate (TESSALON) 100 MG capsule Take 1 capsule (100 mg total) by mouth 3 (three) times daily as needed. 30 capsule [name], DO     PDMP not reviewed this encounter.   [name], [place] 03/15/19 1614

## 2019-03-15 NOTE — ED Triage Notes (Signed)
Patient c/o mucous in the back of her throat x 2 weeks. She states yesterday the mucous was so bad that she had a panic attack. She reports cough that started 2 days ago. Denies fever.

## 2019-03-15 NOTE — Telephone Encounter (Signed)
If she has COVID like symptoms we can send her to the community testing site.

## 2019-03-15 NOTE — Discharge Instructions (Signed)
Albuterol as needed.  Continue antihistamine.  Chest xray and COVID negative.  Take care  Dr. Lacinda Axon

## 2019-03-15 NOTE — Telephone Encounter (Signed)
Could you please arrange this and order for her

## 2019-03-29 ENCOUNTER — Other Ambulatory Visit: Payer: Self-pay | Admitting: Specialist

## 2019-03-29 DIAGNOSIS — R053 Chronic cough: Secondary | ICD-10-CM

## 2019-03-29 DIAGNOSIS — R05 Cough: Secondary | ICD-10-CM

## 2019-03-29 DIAGNOSIS — K219 Gastro-esophageal reflux disease without esophagitis: Secondary | ICD-10-CM

## 2019-04-03 ENCOUNTER — Ambulatory Visit
Admission: RE | Admit: 2019-04-03 | Discharge: 2019-04-03 | Disposition: A | Discharge status: Home / Self Care | Payer: Medicare Other | Source: Ambulatory Visit | Attending: Specialist | Admitting: Specialist

## 2019-04-03 ENCOUNTER — Other Ambulatory Visit: Payer: Self-pay

## 2019-04-03 DIAGNOSIS — K219 Gastro-esophageal reflux disease without esophagitis: Secondary | ICD-10-CM

## 2019-04-03 DIAGNOSIS — R05 Cough: Secondary | ICD-10-CM | POA: Insufficient documentation

## 2019-04-03 DIAGNOSIS — R053 Chronic cough: Secondary | ICD-10-CM

## 2019-04-03 IMAGING — RF DG ESOPHAGUS
10 series · 15 of 24 positions shown · non-contrast
Comparison: None.

CLINICAL DATA: Difficulty swelling

EXAM:
ESOPHOGRAM / BARIUM SWALLOW / BARIUM TABLET STUDY
TECHNIQUE: Combined double contrast and single contrast examination performed
using effervescent crystals, thick barium liquid, and thin barium
liquid. The patient was observed with fluoroscopy swallowing a 13 mm
barium sulphate tablet.
FLUOROSCOPY TIME:  Fluoroscopy Time:  1 minutes 48 seconds
Radiation Exposure Index (if provided by the fluoroscopic device):
23.20 mGy
Number of Acquired Spot Images: 0

[Series 1: cp_standard · 0.25mm/px · 2 of 41 frames shown (1 of 10)]
[frame 7/41]
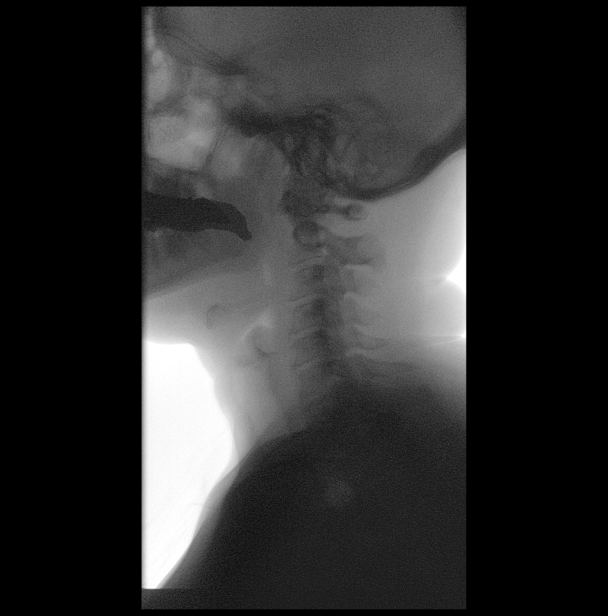
[frame 35/41]
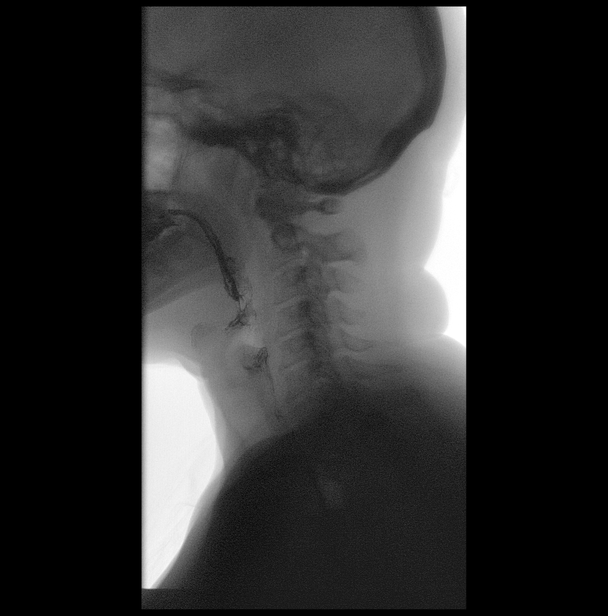

[Series 2: cp_standard · 0.25mm/px · 1 of 35 frames shown (2 of 10)]
[frame 30/35]
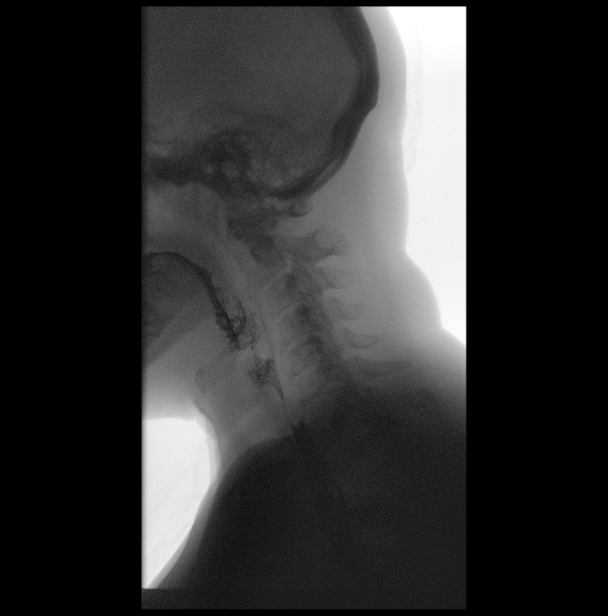

[Series 3: cp_standard · 0.25mm/px · 1 of 49 frames shown (3 of 10)]
[frame 7/49]
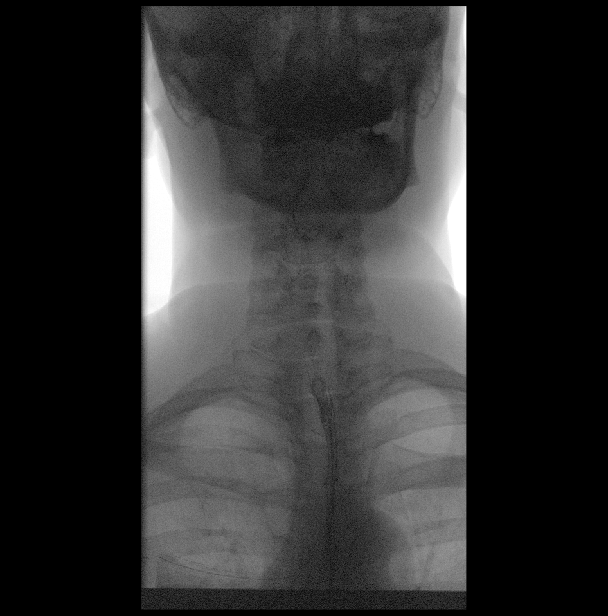

[Series 4: cp_standard · 0.26mm/px · 2 of 88 frames shown (4 of 10)]
[frame 14/88]
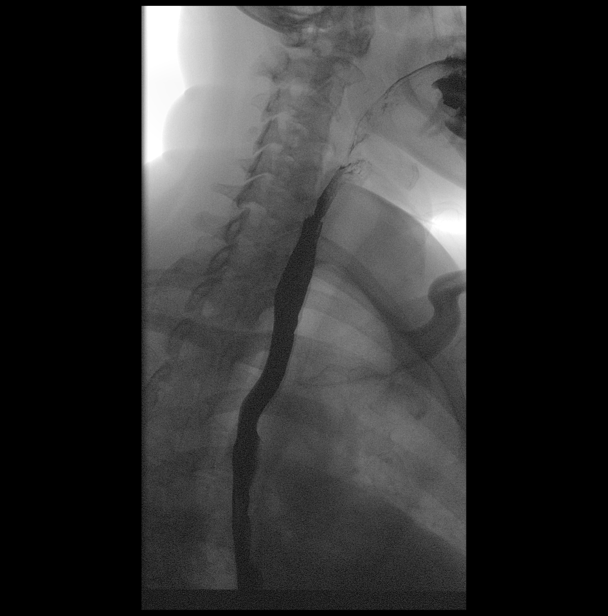
[frame 49/88]
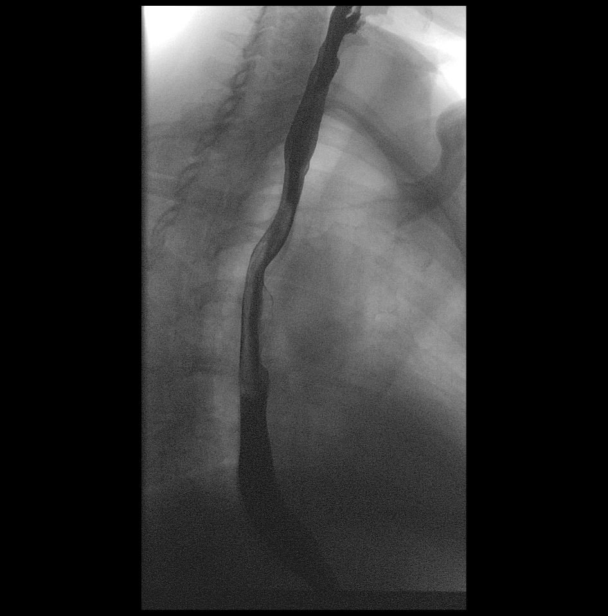

[Series 5: cp_standard · 0.26mm/px · 1 of 125 frames shown (5 of 10)]
[frame 19/125]
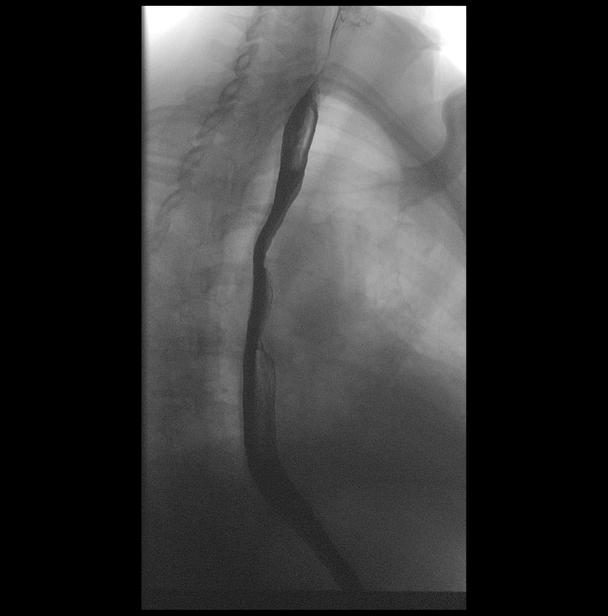

[Series 6: cp_standard · 0.26mm/px · 2 of 83 frames shown (6 of 10)]
[frame 7/83]
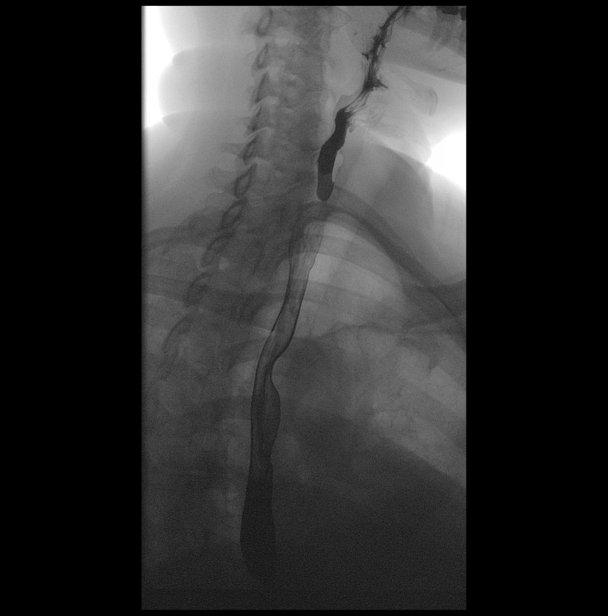
[frame 42/83]
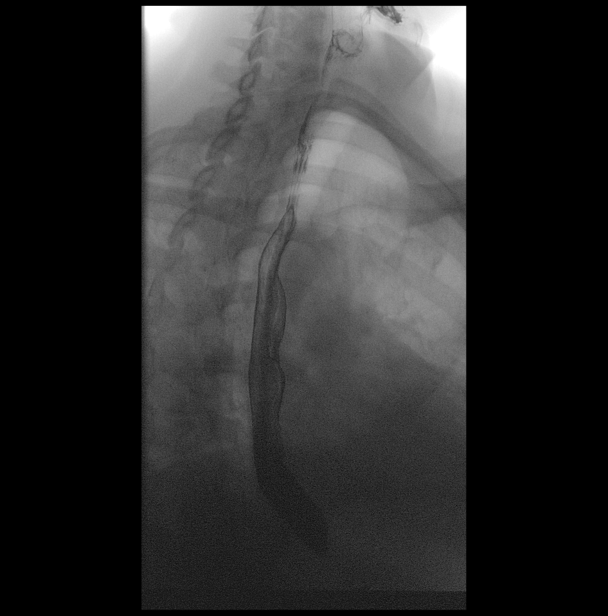

[Series 7: cp_standard · 0.26mm/px · 2 of 140 frames shown (7 of 10)]
[frame 43/140]
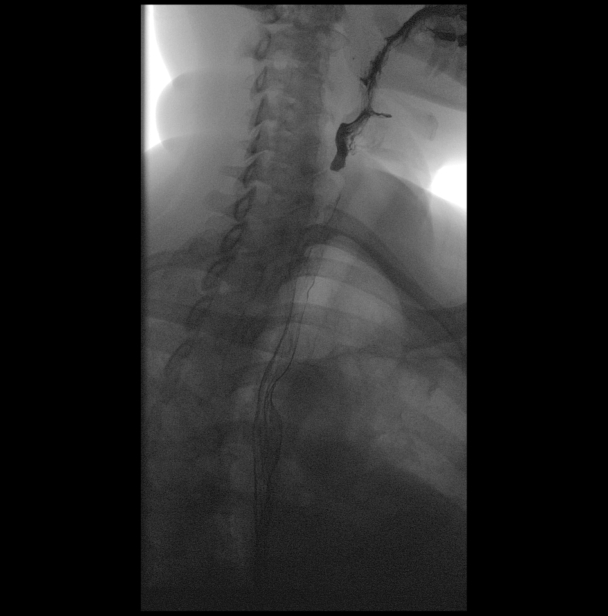
[frame 120/140]
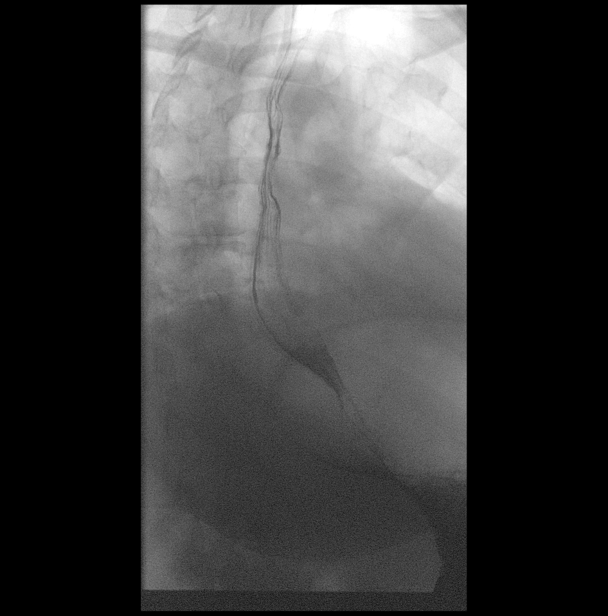

[Series 9: cp_standard · 0.26mm/px · 1 of 111 frames shown (8 of 10)]
[frame 95/111]
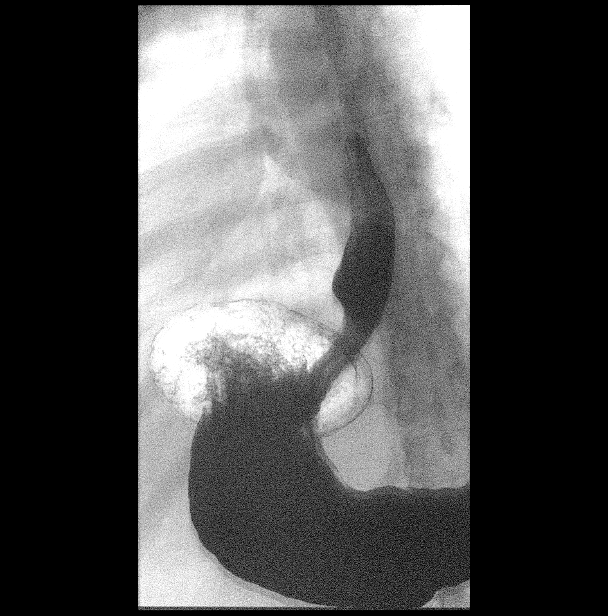

[Series 10: cp_standard · 0.26mm/px · 1 of 193 frames shown (9 of 10)]
[frame 165/193]
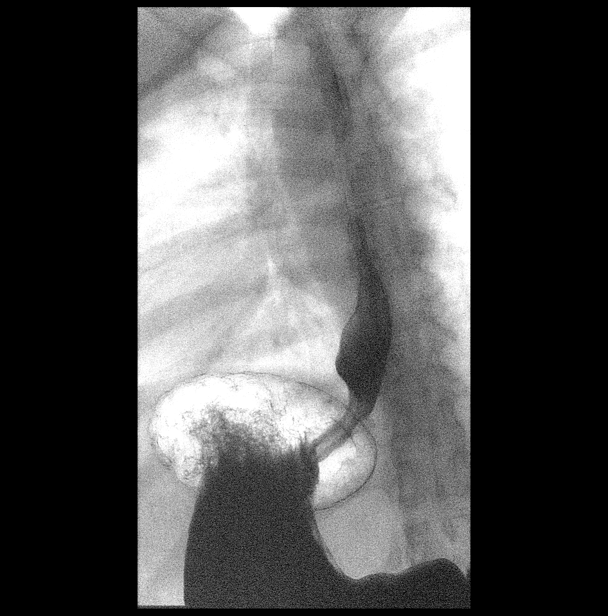

[Series 11: cp_standard · 0.26mm/px · 2 of 200 frames shown (10 of 10)]
[frame 17/200]
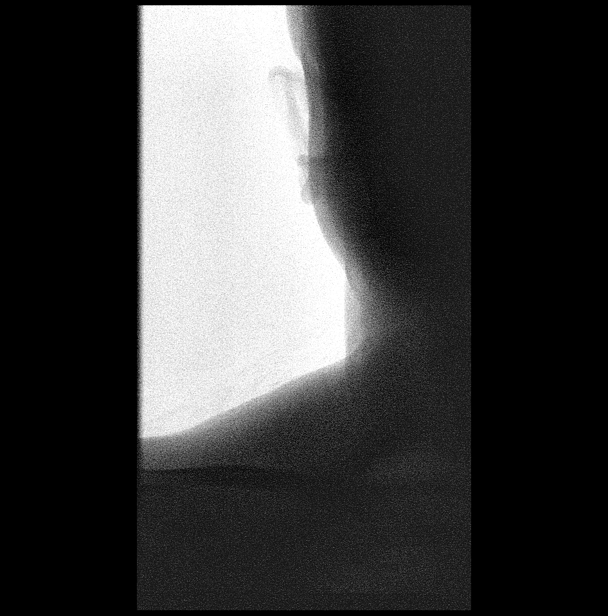
[frame 171/200]
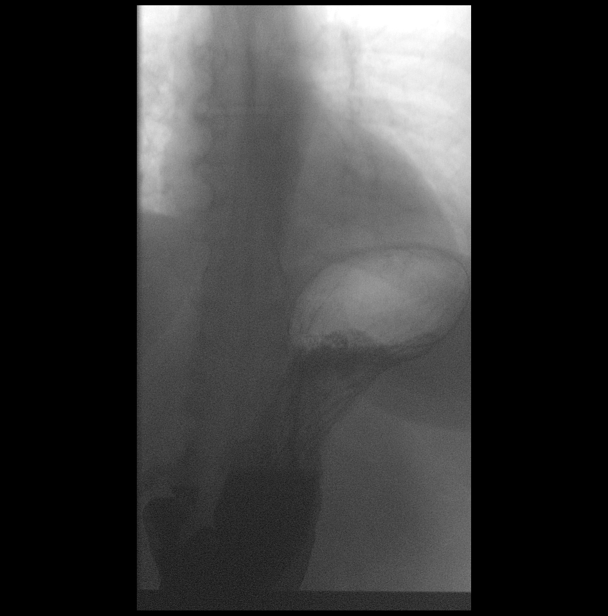

[15 of 24 positions shown; findings below may reference images not displayed]

FINDINGS: The pharynx, esophagus, and stomach are normal in this study. No
strictures or masses. No mucosal abnormalities. Normal peristalsis.
The barium tablet passed normally.
IMPRESSION: Normal study.  No cause for the patient's symptoms identified.

## 2019-04-05 ENCOUNTER — Other Ambulatory Visit: Payer: Self-pay

## 2019-04-05 ENCOUNTER — Encounter: Payer: Self-pay | Admitting: Intensive Care

## 2019-04-05 ENCOUNTER — Emergency Department
Admission: EM | Admit: 2019-04-05 | Discharge: 2019-04-05 | Disposition: A | Discharge status: Home / Self Care | Payer: Medicare Other | Attending: Emergency Medicine | Admitting: Emergency Medicine

## 2019-04-05 DIAGNOSIS — E119 Type 2 diabetes mellitus without complications: Secondary | ICD-10-CM | POA: Insufficient documentation

## 2019-04-05 DIAGNOSIS — R220 Localized swelling, mass and lump, head: Secondary | ICD-10-CM | POA: Diagnosis present

## 2019-04-05 DIAGNOSIS — I10 Essential (primary) hypertension: Secondary | ICD-10-CM | POA: Insufficient documentation

## 2019-04-05 DIAGNOSIS — J45909 Unspecified asthma, uncomplicated: Secondary | ICD-10-CM | POA: Insufficient documentation

## 2019-04-05 DIAGNOSIS — Z794 Long term (current) use of insulin: Secondary | ICD-10-CM | POA: Insufficient documentation

## 2019-04-05 DIAGNOSIS — E039 Hypothyroidism, unspecified: Secondary | ICD-10-CM | POA: Diagnosis not present

## 2019-04-05 DIAGNOSIS — C9110 Chronic lymphocytic leukemia of B-cell type not having achieved remission: Secondary | ICD-10-CM | POA: Diagnosis not present

## 2019-04-05 DIAGNOSIS — Z79899 Other long term (current) drug therapy: Secondary | ICD-10-CM | POA: Diagnosis not present

## 2019-04-05 DIAGNOSIS — M270 Developmental disorders of jaws: Secondary | ICD-10-CM | POA: Insufficient documentation

## 2019-04-05 LAB — COMPREHENSIVE METABOLIC PANEL
ALT: 73 U/L — ABNORMAL HIGH (ref 0–44)
AST: 63 U/L — ABNORMAL HIGH (ref 15–41)
Albumin: 3.7 g/dL (ref 3.5–5.0)
Alkaline Phosphatase: 94 U/L (ref 38–126)
Anion gap: 11 (ref 5–15)
BUN: 9 mg/dL (ref 6–20)
CO2: 23 mmol/L (ref 22–32)
Calcium: 8.9 mg/dL (ref 8.9–10.3)
Chloride: 107 mmol/L (ref 98–111)
Creatinine, Ser: 0.58 mg/dL (ref 0.44–1.00)
GFR calc Af Amer: >60 mL/min (ref >60)
GFR calc non Af Amer: >60 mL/min (ref >60)
Glucose, Bld: 184 mg/dL — ABNORMAL HIGH (ref 70–99)
Potassium: 3.5 mmol/L (ref 3.5–5.1)
Sodium: 141 mmol/L (ref 135–145)
Total Bilirubin: 0.9 mg/dL (ref 0.3–1.2)
Total Protein: 7 g/dL (ref 6.5–8.1)

## 2019-04-05 LAB — CBC WITH DIFFERENTIAL/PLATELET
Abs Immature Granulocytes: 0.02 10*3/uL (ref 0.00–0.07)
Basophils Absolute: 0 10*3/uL (ref 0.0–0.1)
Basophils Relative: 0 %
Eosinophils Absolute: 0.1 10*3/uL (ref 0.0–0.5)
Eosinophils Relative: 3 %
HCT: 37.6 % (ref 36.0–46.0)
Hemoglobin: 12.3 g/dL (ref 12.0–15.0)
Immature Granulocytes: 0 %
Lymphocytes Relative: 37 %
Lymphs Abs: 1.9 10*3/uL (ref 0.7–4.0)
MCH: 26.1 pg (ref 26.0–34.0)
MCHC: 32.7 g/dL (ref 30.0–36.0)
MCV: 79.7 fL — ABNORMAL LOW (ref 80.0–100.0)
Monocytes Absolute: 0.4 10*3/uL (ref 0.1–1.0)
Monocytes Relative: 7 %
Neutro Abs: 2.7 10*3/uL (ref 1.7–7.7)
Neutrophils Relative %: 53 %
Platelets: 284 10*3/uL (ref 150–400)
RBC: 4.72 MIL/uL (ref 3.87–5.11)
RDW: 14.5 % (ref 11.5–15.5)
WBC: 5.2 10*3/uL (ref 4.0–10.5)
nRBC: 0 % (ref 0.0–0.2)

## 2019-04-05 NOTE — Discharge Instructions (Addendum)
Please follow up with Dr. Pryor Ochoa to evaluate this further and discuss whether surgery may be an option

## 2019-04-05 NOTE — ED Triage Notes (Addendum)
Patient has growth present in back of mouth with discoloration to it that has been there for years. Growth has gotten bigger over the last three month and now causing pain in throat and head. Last saw a dentist 2 weeks ago and he told patient he could not do anything about the abscess. Patient has cough present but reports it is due to abscess

## 2019-04-05 NOTE — ED Notes (Signed)
Says growth in top of  mouth for a long time, getting worse.  It makes it hard to breath.  Says she has had tests for swollowing, but she feels it is the growth.

## 2019-04-06 ENCOUNTER — Other Ambulatory Visit: Payer: Self-pay | Admitting: Otolaryngology

## 2019-04-06 DIAGNOSIS — E041 Nontoxic single thyroid nodule: Secondary | ICD-10-CM

## 2019-04-06 NOTE — ED Provider Notes (Signed)
[facility] Emergency Department Provider Note   ____________________________________________    I have reviewed the triage vital signs and the nursing notes.   HISTORY  Chief Complaint Abscess     HPI Tonya Myers is a 53 y.o. female who presents for evaluation of a mass in the top of her mouth.  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  She notes that sometimes when she becomes anxious it feels like it is harder to breathe.  Denies difficulty swallowing.  No other complaints.  Has seen dentist who said that they cannot do anything about it   Past Medical History:  Diagnosis Date  . Acute hemorrhoid 03/11/2015  . Anxiety   . Arthritis   . Asthma   . Cancer ([place])   . Chronic back pain   . CLL (chronic lymphocytic leukemia) (Tonya Myers)   . Depression   . Diabetes mellitus without complication (Tonya Myers)   . Genital herpes    type 2  . Hypertension   . Hypothyroidism   . Vertigo     Patient Active Problem List   Diagnosis Date Noted  . Bacterial vaginosis 11/01/2016  . Chronic pain in right shoulder 03/23/2016  . Numbness and tingling 03/23/2016  . Uncontrolled type 2 diabetes mellitus with hyperglycemia, without long-term current use of insulin (Tonya Myers) 10/27/2015  . DDD (degenerative disc disease), cervical 07/29/2015  . Cervical disc disorder with radiculopathy of cervical region 07/29/2015  . Cervical facet syndrome 07/29/2015  . DDD (degenerative disc disease), lumbar 07/29/2015  . Facet syndrome, lumbar 07/29/2015  . Bilateral occipital neuralgia 07/29/2015  . Sacroiliac joint dysfunction 07/29/2015  . DJD of shoulder 07/29/2015  . Herpes simplex vulvovaginitis 05/29/2015  . Acute hemorrhoid 03/11/2015  . CLL (chronic lymphocytic leukemia) (Tonya Myers) 01/15/2015  . Acquired hypothyroidism 12/26/2014  . Arthritis 12/26/2014  . Carpal tunnel syndrome, bilateral 12/26/2014   . Controlled type 2 diabetes mellitus without complication, without long-term current use of insulin (Tonya Myers) 12/26/2014  . Essential hypertension 12/26/2014  . Hyperlipidemia, mixed 12/26/2014    Past Surgical History:  Procedure Laterality Date  . ABDOMINAL HYSTERECTOMY    . BILATERAL SALPINGOOPHORECTOMY  2009  . BREAST BIOPSY Right 05/17/2016   FIBROADENOMATOUS CHANGE AND SCLEROSING ADENOSIS WITH  . COLONOSCOPY WITH PROPOFOL N/A 06/16/2015   Procedure: COLONOSCOPY WITH PROPOFOL;  Surgeon: [name], MD;  Location: [facility] ENDOSCOPY;  Service: Endoscopy;  Laterality: N/A;  . LAPAROSCOPIC SUPRACERVICAL HYSTERECTOMY  2009   due to Tonya Myers  . OOPHORECTOMY      Prior to Admission medications   Medication Sig Start Date End Date Taking? Authorizing Provider  albuterol (VENTOLIN HFA) 108 (90 Base) MCG/ACT inhaler Inhale 1 puff into the lungs every 6 (six) hours as needed for wheezing or shortness of breath. 03/15/19 03/14/20  [name], DO  benzonatate (TESSALON) 100 MG capsule Take 1 capsule (100 mg total) by mouth 3 (three) times daily as needed. 03/15/19   [name], DO  Blood Glucose Monitoring Suppl (FIFTY50 GLUCOSE METER 2.0) w/Device KIT  02/25/15   [provider]  cetirizine (ZYRTEC) 10 MG tablet  09/27/18   [provider]  diltiazem (DILACOR XR) 120 MG 24 hr capsule Take by mouth. 12/26/14   [provider]  estradiol (ESTRACE) 0.5 MG tablet  03/14/18   [provider]  ezetimibe (ZETIA) 10 MG tablet Take 10 mg by mouth daily.  [provider]  Fluticasone-Salmeterol (ADVAIR) 250-50 MCG/DOSE AEPB Inhale 1 puff into the lungs.  11/03/17 11/03/18  [provider]  furosemide (LASIX) 20 MG tablet Take by mouth. Reported on 07/29/2015    [provider]  gabapentin (NEURONTIN) 400 MG capsule 400 mg. 08/28/18   [provider]  glucose blood (ONE TOUCH ULTRA TEST) test strip USE THREE TIMES A DAY. USE AS  INSTRUCTED 04/06/16   [provider]  insulin aspart (NOVOLOG FLEXPEN) 100 UNIT/ML FlexPen Inject 10 units before supper. Aim to take 10-15 minutes before eating. 01/26/19   [provider]  Insulin Detemir (LEVEMIR FLEXTOUCH) 100 UNIT/ML Pen Inject 42 units nightly. Take between 8 - 10 PM. 01/26/19   [provider]  Insulin Pen Needle (FIFTY50 PEN NEEDLES) 32G X 4 MM MISC Use two daily as directed 01/26/19   [provider]  Lancets Misc. (UNISTIK 2 NORMAL) MISC 1 each. 02/25/15   [provider]  levothyroxine (SYNTHROID, LEVOTHROID) 150 MCG tablet Take 150 mcg by mouth daily before breakfast.     [provider]  montelukast (SINGULAIR) 10 MG tablet Take 10 mg by mouth at bedtime.    [provider]  omeprazole (PRILOSEC) 20 MG capsule Take by mouth. 08/17/18 08/17/19  [provider]  Jonetta Speak LANCETS 43O MISC USE THREE TIMES A DAY. USE AS INSTRUCTED 04/06/16   [provider]  pregabalin (LYRICA) 75 MG capsule Take 75 mg by mouth 2 (two) times daily.    [provider]  rosuvastatin (CRESTOR) 10 MG tablet TAKE 1 TABLET DAILY 09/16/17   [provider]  Semaglutide, 1 MG/DOSE, (OZEMPIC, 1 MG/DOSE,) 2 MG/1.5ML SOPN INJECT 1MG SUBCUTANEOUS EVERY 7 DAYS 01/16/19   [provider]  telmisartan (MICARDIS) 80 MG tablet Take 80 mg by mouth daily.    [provider]  valACYclovir (VALTREX) 1000 MG tablet Take 1 tablet (1,000 mg total) by mouth 2 (two) times daily. 07/20/18   [name], MD  budesonide-formoterol (SYMBICORT) 160-4.5 MCG/ACT inhaler Inhale into the lungs. 07/14/18 03/15/19  [provider]     Allergies Patient has no known allergies.  Family History  Problem Relation Age of Onset  . Cancer Paternal Aunt   . Breast cancer Maternal Aunt 70    Social History Social History   Tobacco Use  . Smoking status: Never Smoker  . Smokeless tobacco:  Never Used  Substance Use Topics  . Alcohol use: Yes    Alcohol/week: 1.0 standard drinks    Types: 1 Cans of beer per week  . Drug use: No    Review of Systems  Constitutional: No fever/chills  ENT: As above     Skin: Negative for rash. Neurological: Negative for headaches     ____________________________________________   PHYSICAL EXAM:  VITAL SIGNS: ED Triage Vitals  Enc Vitals Group     BP 04/05/19 0836 (!) 158/108     Pulse Rate 04/05/19 0836 80     Resp 04/05/19 0836 18     Temp 04/05/19 0836 98.7 F (37.1 C)     Temp Source 04/05/19 0836 Oral     SpO2 04/05/19 0836 98 %     Weight 04/05/19 0837 99.8 kg (220 lb)     Height 04/05/19 0837 1.829 m (6')     Head Circumference --      Peak Flow --      Pain Score 04/05/19 0836 9     Pain Loc --  Pain Edu? --      Excl. in [place]? --      Constitutional: Alert and oriented.   Nose: No congestion/rhinnorhea. Mouth/Throat: Mucous membranes are moist.  Mass noted to the hard palate centrally located, no bleeding no interference with airflow, pharynx normal Cardiovascular: Normal rate, regular rhythm.  Respiratory: Normal respiratory effort.  No retractions. Genitourinary: deferred Musculoskeletal: No lower extremity tenderness nor edema.   Neurologic:  Normal speech and language. No gross focal neurologic deficits are appreciated.   Skin:  Skin is warm, dry and intact. No rash noted.   ____________________________________________   LABS (all labs ordered are listed, but only abnormal results are displayed)  Labs Reviewed  COMPREHENSIVE METABOLIC PANEL - Abnormal; Notable for the following components:      Result Value   Glucose, Bld 184 (*)    AST 63 (*)    ALT 73 (*)    All other components within normal limits  CBC WITH DIFFERENTIAL/PLATELET - Abnormal; Notable for the following components:   MCV 79.7 (*)    All other components within normal limits    ____________________________________________  EKG   ____________________________________________  RADIOLOGY  None ____________________________________________   PROCEDURES  Procedure(s) performed: No  Procedures   Critical Care performed: No ____________________________________________   INITIAL IMPRESSION / ASSESSMENT AND PLAN / ED COURSE  Pertinent labs & imaging results that were available during my care of the patient were reviewed by me and considered in my medical decision making (see chart for details).  Discussed with Dr. Pryor Ochoa of ENT, he notes patient can follow-up with him in his office to determine whether surgery may be necessary   ____________________________________________   FINAL CLINICAL IMPRESSION(S) / ED DIAGNOSES  Final diagnoses:  Torus palatinus      NEW MEDICATIONS STARTED DURING THIS VISIT:  Discharge Medication List as of 04/05/2019 10:48 AM       Note:  This document was prepared using Dragon voice recognition software and may include unintentional dictation errors.   Lavonia Drafts, MD 04/06/19 2146

## 2019-04-13 ENCOUNTER — Ambulatory Visit
Admission: RE | Admit: 2019-04-13 | Discharge: 2019-04-13 | Disposition: A | Discharge status: Home / Self Care | Payer: Medicare Other | Source: Ambulatory Visit | Attending: Otolaryngology | Admitting: Otolaryngology

## 2019-04-13 ENCOUNTER — Other Ambulatory Visit: Payer: Self-pay

## 2019-04-13 DIAGNOSIS — E041 Nontoxic single thyroid nodule: Secondary | ICD-10-CM

## 2019-06-18 ENCOUNTER — Other Ambulatory Visit: Payer: Self-pay | Admitting: Family Medicine

## 2019-06-18 DIAGNOSIS — C9110 Chronic lymphocytic leukemia of B-cell type not having achieved remission: Secondary | ICD-10-CM

## 2019-07-13 ENCOUNTER — Ambulatory Visit: Payer: Medicare Other | Attending: Internal Medicine

## 2019-07-13 DIAGNOSIS — Z23 Encounter for immunization: Secondary | ICD-10-CM

## 2019-07-13 NOTE — Progress Notes (Signed)
   Covid-19 Vaccination Clinic  Name:  Kellis Topete    MRN: [id number] DOB: [birth year]  07/13/2019  Ms. Hammes was observed post Covid-19 immunization for 30 minutes based on pre-vaccination screening without incident. She was provided with Vaccine Information Sheet and instruction to access the V-Safe system.   Ms. Michelle was instructed to call 911 with any severe reactions post vaccine: Marland Kitchen Difficulty breathing  . Swelling of face and throat  . A fast heartbeat  . A bad rash all over body  . Dizziness and weakness   Immunizations Administered    Name Date Dose VIS Date Route   Pfizer COVID-19 Vaccine 07/13/2019  1:42 PM 0.3 mL 04/06/2019 Intramuscular   Manufacturer: [place]   Lot: [id number]   [place]: [id number]

## 2019-07-18 ENCOUNTER — Other Ambulatory Visit: Payer: Self-pay | Admitting: Emergency Medicine

## 2019-07-18 DIAGNOSIS — C9110 Chronic lymphocytic leukemia of B-cell type not having achieved remission: Secondary | ICD-10-CM

## 2019-07-19 ENCOUNTER — Other Ambulatory Visit: Payer: Self-pay

## 2019-07-19 ENCOUNTER — Inpatient Hospital Stay: Payer: Medicare Other | Attending: Oncology

## 2019-07-19 DIAGNOSIS — C9190 Lymphoid leukemia, unspecified not having achieved remission: Secondary | ICD-10-CM | POA: Insufficient documentation

## 2019-07-19 DIAGNOSIS — C9110 Chronic lymphocytic leukemia of B-cell type not having achieved remission: Secondary | ICD-10-CM

## 2019-07-19 LAB — COMPREHENSIVE METABOLIC PANEL
ALT: 41 U/L (ref 0–44)
AST: 30 U/L (ref 15–41)
Albumin: 3.9 g/dL (ref 3.5–5.0)
Alkaline Phosphatase: 83 U/L (ref 38–126)
Anion gap: 7 (ref 5–15)
BUN: 19 mg/dL (ref 6–20)
CO2: 27 mmol/L (ref 22–32)
Calcium: 9 mg/dL (ref 8.9–10.3)
Chloride: 108 mmol/L (ref 98–111)
Creatinine, Ser: 0.91 mg/dL (ref 0.44–1.00)
GFR calc Af Amer: >60 mL/min (ref >60)
GFR calc non Af Amer: >60 mL/min (ref >60)
Glucose, Bld: 81 mg/dL (ref 70–99)
Potassium: 4 mmol/L (ref 3.5–5.1)
Sodium: 142 mmol/L (ref 135–145)
Total Bilirubin: 0.7 mg/dL (ref 0.3–1.2)
Total Protein: 6.7 g/dL (ref 6.5–8.1)

## 2019-07-19 LAB — CBC WITH DIFFERENTIAL/PLATELET
Abs Immature Granulocytes: 0.02 10*3/uL (ref 0.00–0.07)
Basophils Absolute: 0.1 10*3/uL (ref 0.0–0.1)
Basophils Relative: 1 %
Eosinophils Absolute: 0.3 10*3/uL (ref 0.0–0.5)
Eosinophils Relative: 4 %
HCT: 38.8 % (ref 36.0–46.0)
Hemoglobin: 12.3 g/dL (ref 12.0–15.0)
Immature Granulocytes: 0 %
Lymphocytes Relative: 47 %
Lymphs Abs: 3.5 10*3/uL (ref 0.7–4.0)
MCH: 26.2 pg (ref 26.0–34.0)
MCHC: 31.7 g/dL (ref 30.0–36.0)
MCV: 82.6 fL (ref 80.0–100.0)
Monocytes Absolute: 0.6 10*3/uL (ref 0.1–1.0)
Monocytes Relative: 8 %
Neutro Abs: 3 10*3/uL (ref 1.7–7.7)
Neutrophils Relative %: 40 %
Platelets: 296 10*3/uL (ref 150–400)
RBC: 4.7 MIL/uL (ref 3.87–5.11)
RDW: 14.9 % (ref 11.5–15.5)
WBC: 7.5 10*3/uL (ref 4.0–10.5)
nRBC: 0 % (ref 0.0–0.2)

## 2019-07-19 LAB — LACTATE DEHYDROGENASE: LDH: 140 U/L (ref 98–192)

## 2019-08-07 ENCOUNTER — Ambulatory Visit: Payer: Medicare Other | Attending: Internal Medicine

## 2019-08-07 DIAGNOSIS — Z23 Encounter for immunization: Secondary | ICD-10-CM

## 2019-08-07 NOTE — Progress Notes (Signed)
   Covid-19 Vaccination Clinic  Name:  Tonya Myers    MRN: [id number] DOB: 1966/02/20  08/07/2019  [name] was observed post Covid-19 immunization for 15 minutes without incident. She was provided with Vaccine Information Sheet and instruction to access the V-Safe system.   [name] was instructed to call 911 with any severe reactions post vaccine: Marland Kitchen Difficulty breathing  . Swelling of face and throat  . A fast heartbeat  . A bad rash all over body  . Dizziness and weakness   Immunizations Administered    Name Date Dose VIS Date Route   Pfizer COVID-19 Vaccine 08/07/2019  1:56 PM 0.3 mL 04/06/2019 Intramuscular   Manufacturer: [employer]   Lot: [id number]   [place]: [address]-7

## 2020-01-07 ENCOUNTER — Other Ambulatory Visit: Payer: Self-pay | Admitting: Internal Medicine

## 2020-01-07 DIAGNOSIS — R748 Abnormal levels of other serum enzymes: Secondary | ICD-10-CM

## 2020-01-13 NOTE — Progress Notes (Signed)
[place]  [phone]  [phone]     Tonya Myers DOB: 05/03/1965  MR#: [id number]  GYB#:[id number]  Patient Care Team: [name], MD as PCP - General (Family Medicine)  CHIEF COMPLAINT:  CLL  INTERVAL HISTORY: Patient returns to clinic today for routine yearly evaluation.  She continues to feel well and remains asymptomatic. She does not complain of weakness or fatigue. She denies any fevers, night sweats, or weight loss. She has no neurologic complaints.  She denies any chest pain, shortness of breath, cough, or hemoptysis.  She denies any nausea, vomiting, constipation, or diarrhea.  She has no urinary complaints.  Patient offers no specific complaints today.  REVIEW OF SYSTEMS:   Review of Systems  Constitutional: Negative.  Negative for fever, malaise/fatigue and weight loss.  Respiratory: Negative.  Negative for cough and shortness of breath.   Cardiovascular: Negative.  Negative for chest pain and leg swelling.  Gastrointestinal: Negative.  Negative for abdominal pain, constipation and nausea.  Genitourinary: Negative.  Negative for dysuria.  Musculoskeletal: Negative.  Negative for joint pain.  Skin: Negative.  Negative for rash.  Neurological: Negative.  Negative for dizziness, sensory change, weakness and headaches.  Psychiatric/Behavioral: Negative.  Negative for depression. The patient is not nervous/anxious.     As per HPI. Otherwise, a complete review of systems is negative.   PAST MEDICAL HISTORY: Past Medical History:  Diagnosis Date   Acute hemorrhoid 03/11/2015   Anxiety    Arthritis    Asthma    Cancer ([place])    Chronic back pain    CLL (chronic lymphocytic leukemia) ([place])    Depression    Diabetes mellitus without complication ([place])    Genital herpes    type 2   Hypertension    Hypothyroidism    Vertigo     PAST SURGICAL HISTORY: Past Surgical History:  Procedure Laterality Date    ABDOMINAL HYSTERECTOMY     BILATERAL SALPINGOOPHORECTOMY  2009   BREAST BIOPSY Right 05/17/2016   FIBROADENOMATOUS CHANGE AND SCLEROSING ADENOSIS WITH   COLONOSCOPY WITH PROPOFOL N/A 06/16/2015   Procedure: COLONOSCOPY WITH PROPOFOL;  Surgeon: [name], MD;  Location: [facility] ENDOSCOPY;  Service: Endoscopy;  Laterality: N/A;   LAPAROSCOPIC SUPRACERVICAL HYSTERECTOMY  2009   due to [place] HISTORY Family History  Problem Relation Age of Onset   Cancer Paternal Aunt    Breast cancer Maternal Aunt 7    GYNECOLOGIC HISTORY:  No LMP recorded. Patient has had a hysterectomy.     ADVANCED DIRECTIVES:    HEALTH MAINTENANCE: Social History   Tobacco Use   Smoking status: Never Smoker   Smokeless tobacco: Never Used  Vaping Use   Vaping Use: Never used  Substance Use Topics   Alcohol use: Yes    Alcohol/week: 1.0 standard drink    Types: 1 Cans of beer per week   Drug use: No     No Known Allergies  Current Outpatient Medications  Medication Sig Dispense Refill   albuterol (VENTOLIN HFA) 108 (90 Base) MCG/ACT inhaler Inhale 1 puff into the lungs every 6 (six) hours as needed for wheezing or shortness of breath. (Patient taking differently: Inhale 1 puff into the lungs in the morning and at bedtime. ) 18 g 1   Blood Glucose Monitoring Suppl (FIFTY50 GLUCOSE METER 2.0) w/Device KIT      diltiazem (DILACOR XR) 120 MG 24 hr capsule Take 120 mg by  mouth daily.      estradiol (ESTRACE) 0.5 MG tablet   5   ezetimibe (ZETIA) 10 MG tablet Take 10 mg by mouth daily.      fluticasone (FLONASE) 50 MCG/ACT nasal spray Place 1 spray into both nostrils in the morning and at bedtime.     furosemide (LASIX) 20 MG tablet Take 10 mg by mouth daily. Reported on 07/29/2015     gabapentin (NEURONTIN) 400 MG capsule 400 mg.     Insulin Glargine-Lixisenatide 100-33 UNT-MCG/ML SOPN Inject 24 Units into the skin daily before breakfast.     insulin  lispro (HUMALOG) 100 UNIT/ML injection Inject into the skin 3 (three) times daily before meals. Sliding scale average of 10 units     Lancets Misc. (UNISTIK 2 NORMAL) MISC 1 each.     levothyroxine (SYNTHROID, LEVOTHROID) 150 MCG tablet Take 150 mcg by mouth daily before breakfast.      montelukast (SINGULAIR) 10 MG tablet Take 10 mg by mouth at bedtime.     ONETOUCH DELICA LANCETS 28U MISC USE THREE TIMES A DAY. USE AS INSTRUCTED     telmisartan (MICARDIS) 80 MG tablet Take 80 mg by mouth daily.     valACYclovir (VALTREX) 1000 MG tablet Take 1 tablet (1,000 mg total) by mouth 2 (two) times daily. 60 tablet 1   No current facility-administered medications for this visit.    OBJECTIVE: BP 137/74 (BP Location: Left Arm, Patient Position: Sitting, Cuff Size: Large)    Pulse 78    Temp 98.2 F (36.8 C) (Tympanic)    Resp 20    Ht 6' (1.829 m)    Wt 246 lb (111.6 kg)    SpO2 100%    BMI 33.36 kg/m    Body mass index is 33.36 kg/m.    ECOG FS:1 - Symptomatic but completely ambulatory  General: Well-developed, well-nourished, no acute distress. Eyes: Pink conjunctiva, anicteric sclera. HEENT: Normocephalic, moist mucous membranes. Lungs: No audible wheezing or coughing. Heart: Regular rate and rhythm. Abdomen: Soft, nontender, no obvious distention. Musculoskeletal: No edema, cyanosis, or clubbing. Neuro: Alert, answering all questions appropriately. Cranial nerves grossly intact. Skin: No rashes or petechiae noted. Psych: Normal affect. Lymphatics: No obvious lymphadenopathy.   LAB RESULTS:  Appointment on 01/17/2020  Component Date Value Ref Range Status   LDH 01/17/2020 176  98 - 192 U/L Final   Performed at [facility], [address] [place], [place] [address]   Sodium 01/17/2020 140  135 - 145 mmol/L Final   Potassium 01/17/2020 3.3* 3.5 - 5.1 mmol/L Final   Chloride 01/17/2020 103  98 - 111 mmol/L Final   CO2 01/17/2020 27  22 - 32 mmol/L Final   Glucose,  Bld 01/17/2020 312* 70 - 99 mg/dL Final   Glucose reference range applies only to samples taken after fasting for at least 8 hours.   BUN 01/17/2020 10  6 - 20 mg/dL Final   Creatinine, Ser 01/17/2020 0.93  0.44 - 1.00 mg/dL Final   Calcium 01/17/2020 8.1* 8.9 - 10.3 mg/dL Final   Total Protein 01/17/2020 6.1* 6.5 - 8.1 g/dL Final   Albumin 01/17/2020 3.6  3.5 - 5.0 g/dL Final   AST 01/17/2020 47* 15 - 41 U/L Final   ALT 01/17/2020 69* 0 - 44 U/L Final   Alkaline Phosphatase 01/17/2020 82  38 - 126 U/L Final   Total Bilirubin 01/17/2020 0.7  0.3 - 1.2 mg/dL Final   GFR calc non Af Amer 01/17/2020 >60  >  60 mL/min Final   GFR calc Af Amer 01/17/2020 >60  >60 mL/min Final   Anion gap 01/17/2020 10  5 - 15 Final   Performed at [facility], [place], [place] [address]   WBC 01/17/2020 4.3  4.0 - 10.5 K/uL Final   RBC 01/17/2020 4.11  3.87 - 5.11 MIL/uL Final   Hemoglobin 01/17/2020 10.9* 12.0 - 15.0 g/dL Final   HCT 01/17/2020 32.9* 36 - 46 % Final   MCV 01/17/2020 80.0  80.0 - 100.0 fL Final   MCH 01/17/2020 26.5  26.0 - 34.0 pg Final   MCHC 01/17/2020 33.1  30.0 - 36.0 g/dL Final   RDW 01/17/2020 14.4  11.5 - 15.5 % Final   Platelets 01/17/2020 213  150 - 400 K/uL Final   nRBC 01/17/2020 0.0  0.0 - 0.2 % Final   Neutrophils Relative % 01/17/2020 50  % Final   Neutro Abs 01/17/2020 2.2  1.7 - 7.7 K/uL Final   Lymphocytes Relative 01/17/2020 37  % Final   Lymphs Abs 01/17/2020 1.6  0.7 - 4.0 K/uL Final   Monocytes Relative 01/17/2020 7  % Final   Monocytes Absolute 01/17/2020 0.3  0 - 1 K/uL Final   Eosinophils Relative 01/17/2020 4  % Final   Eosinophils Absolute 01/17/2020 0.2  0 - 0 K/uL Final   Basophils Relative 01/17/2020 1  % Final   Basophils Absolute 01/17/2020 0.0  0 - 0 K/uL Final   Immature Granulocytes 01/17/2020 1  % Final   Abs Immature Granulocytes 01/17/2020 0.02  0.00 - 0.07 K/uL Final   Performed at [facility], [address] [place], [place] [address]    STUDIES: US Abdomen Limited RUQ  Result Date: 01/15/2020 CLINICAL DATA:  Elevated LFTs, history of CLL EXAM: ULTRASOUND ABDOMEN LIMITED RIGHT UPPER QUADRANT COMPARISON:  04/13/2016 FINDINGS: Gallbladder: No gallstones or wall thickening visualized. No sonographic Murphy sign noted by sonographer. Common bile duct: Diameter: 3.8 mm Liver: Increased hepatic echogenicity compatible with hepatic steatosis. This limits evaluation of the liver. Within the limits of the study, there is no large focal abnormality or intrahepatic biliary dilatation. Portal vein is patent on color Doppler imaging with normal direction of blood flow towards the liver. Other: No free fluid or ascites IMPRESSION: Hepatic steatosis.  See above comment. No other acute finding by ultrasound. Electronically Signed   By: Jerilynn Mages.  Shick M.D.   On: 01/15/2020 09:42    ASSESSMENT: CLL  PLAN:   1. CLL:  Patient last received treatment with Rituxan and Treanda on April 09, 2015.  Her white blood cell continues to be within normal limits with a normal differential.  Her most recent imaging with CT scan on April 13, 2016 were reviewed independently with no evidence of disease. Patient does not require additional treatment at this time. Patient has also requested no further CTs be done unless there is consideration of reinitiation of treatment.  If patient needs retreatment, would likely use the same regimen, but patient would need to be heavily pretreated secondary to her previous reaction to Rituxan.  We also discussed the possibility of using Imbruvica if needed.  Return to clinic in 1 year for repeat laboratory work and routine evaluation.  2. Anxiety: Improved. Patient will require Ativan with any future CT scans. 3. Anemia: Resolved. Patient had a colonoscopy in February 2017 that was reported as normal.  I spent a total of 20 minutes reviewing chart data, face-to-face  evaluation  with the patient, counseling and coordination of care as detailed above.   Patient expressed understanding and was in agreement with this plan. She also understands that She can call clinic at any time with any questions, concerns, or complaints.   Lloyd Huger, MD   01/18/2020 10:33 AM

## 2020-01-15 ENCOUNTER — Other Ambulatory Visit: Payer: Self-pay

## 2020-01-15 ENCOUNTER — Ambulatory Visit
Admission: RE | Admit: 2020-01-15 | Discharge: 2020-01-15 | Disposition: A | Discharge status: Home / Self Care | Payer: Medicare Other | Source: Ambulatory Visit | Attending: Internal Medicine | Admitting: Internal Medicine

## 2020-01-15 DIAGNOSIS — R748 Abnormal levels of other serum enzymes: Secondary | ICD-10-CM

## 2020-01-15 IMAGING — US US ABDOMEN LIMITED
1 series · 14 of 25 positions shown · non-contrast
Comparison: [DATE]

CLINICAL DATA: Elevated LFTs, history of CLL

EXAM:
ULTRASOUND ABDOMEN LIMITED RIGHT UPPER QUADRANT

[Series 1: us abdomen limited · 0.23mm/px · 14 of 47 slices shown]
[im 1/47]
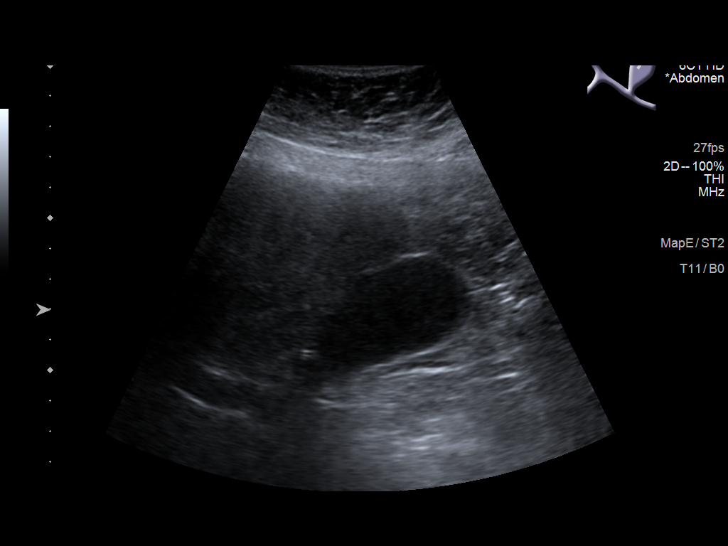
[im 4/47]
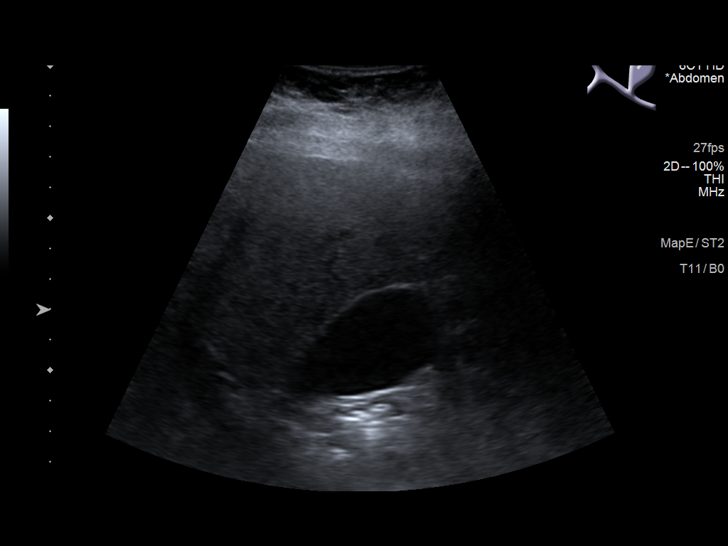
[im 8/47]
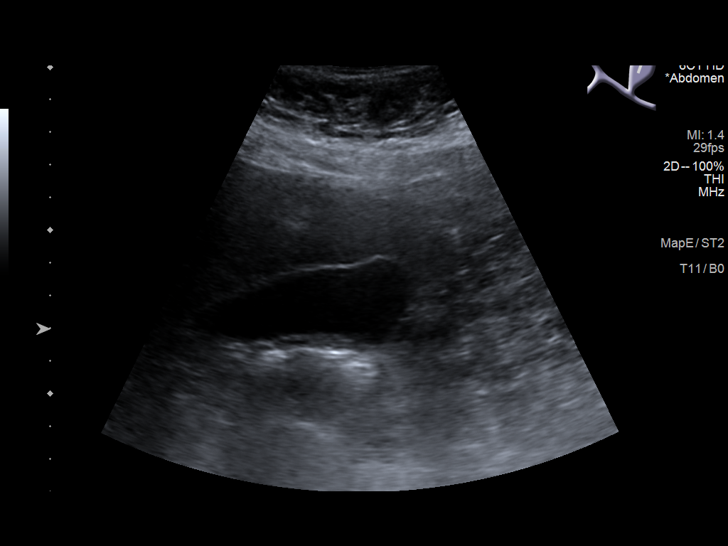
[im 12/47]
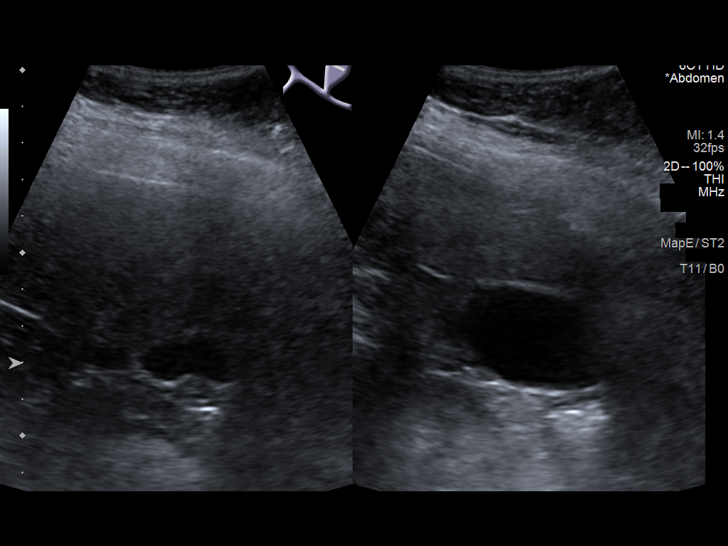
[im 16/47]
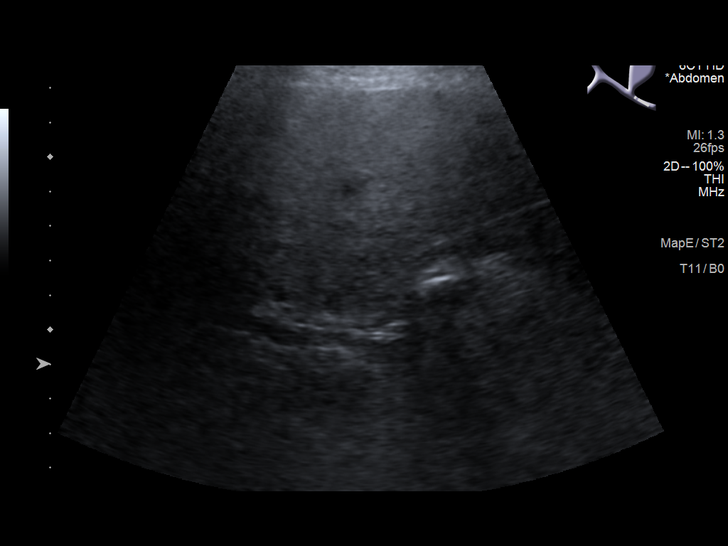
[im 18/47]
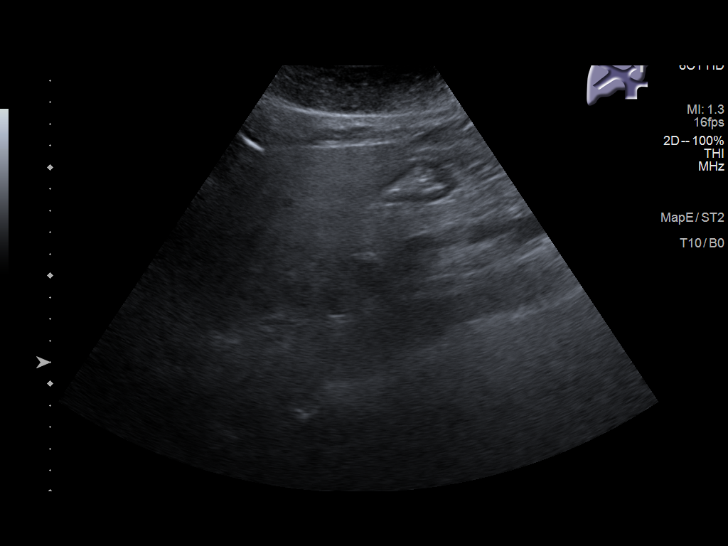
[im 22/47]
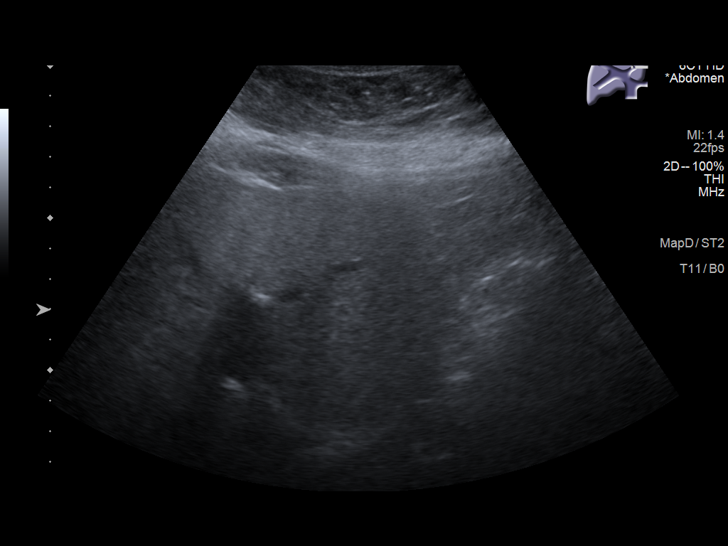
[im 25/47]
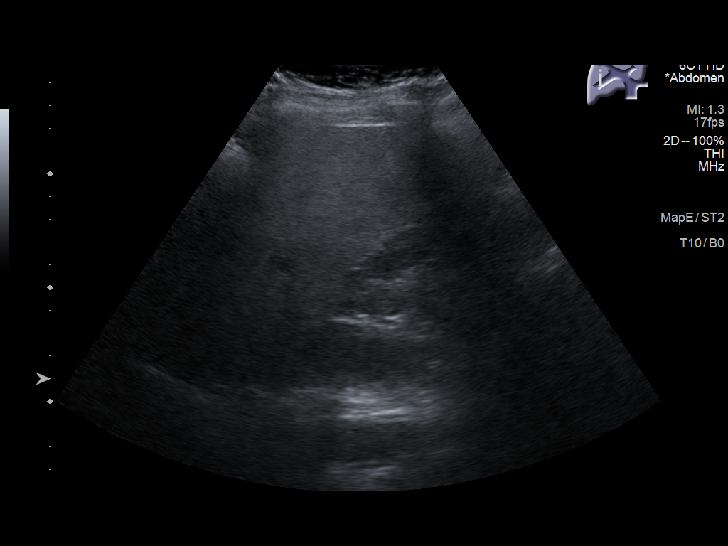
[im 29/47]
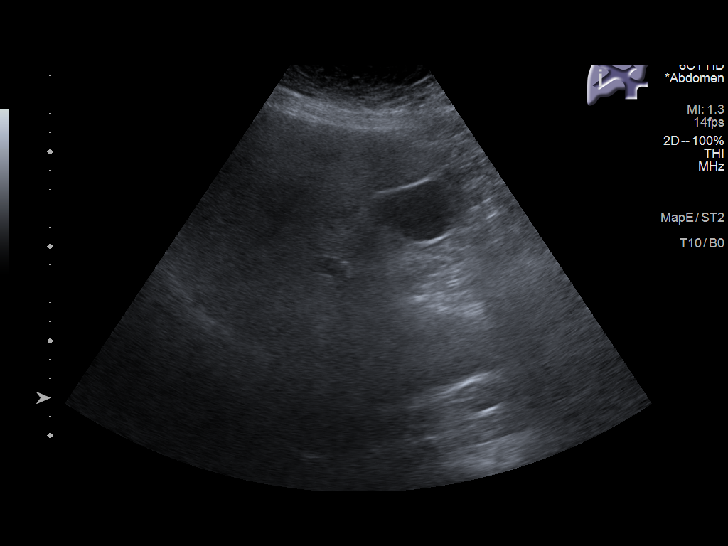
[im 31/47]
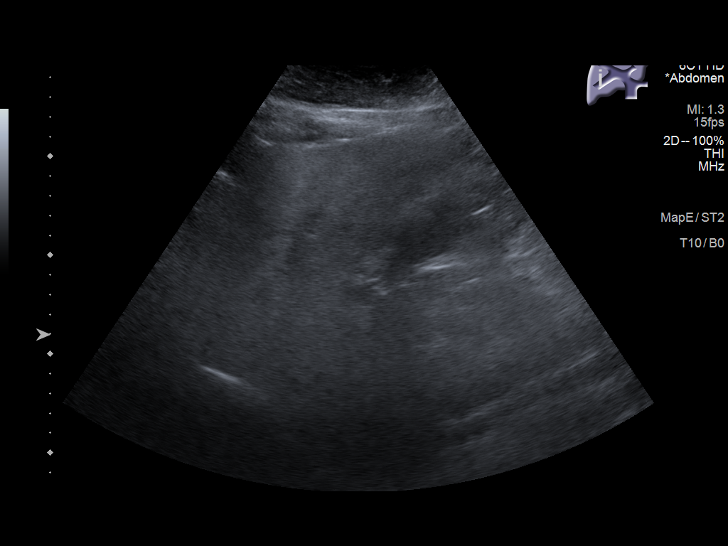
[im 35/47]
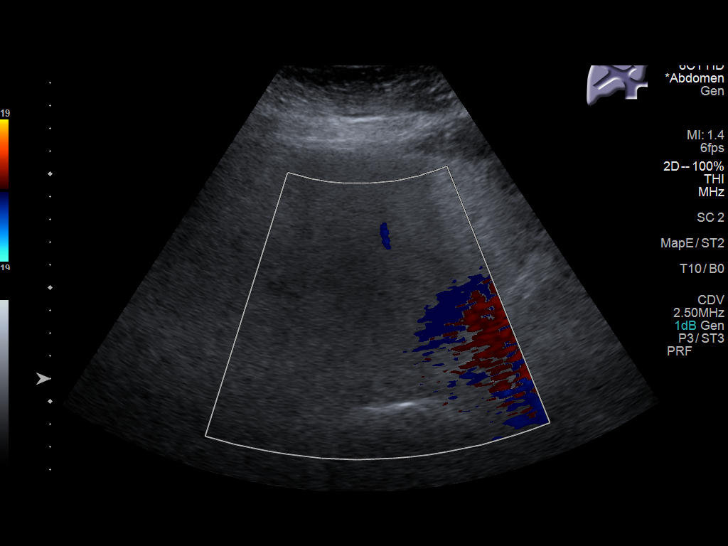
[im 39/47]
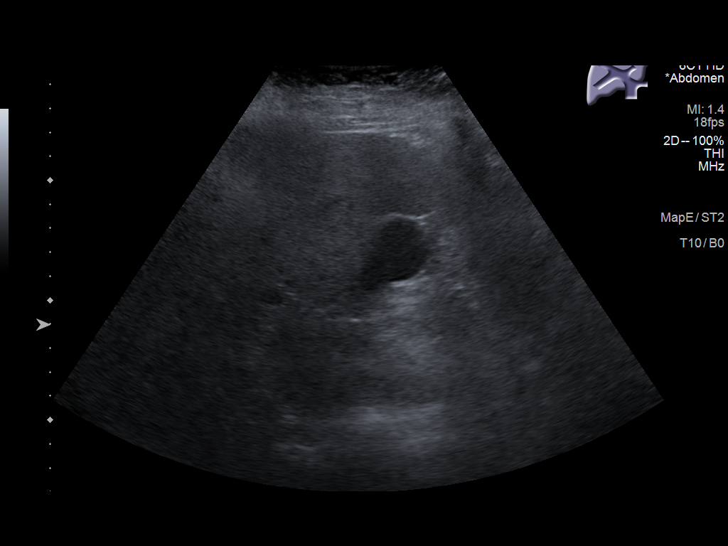
[im 43/47]
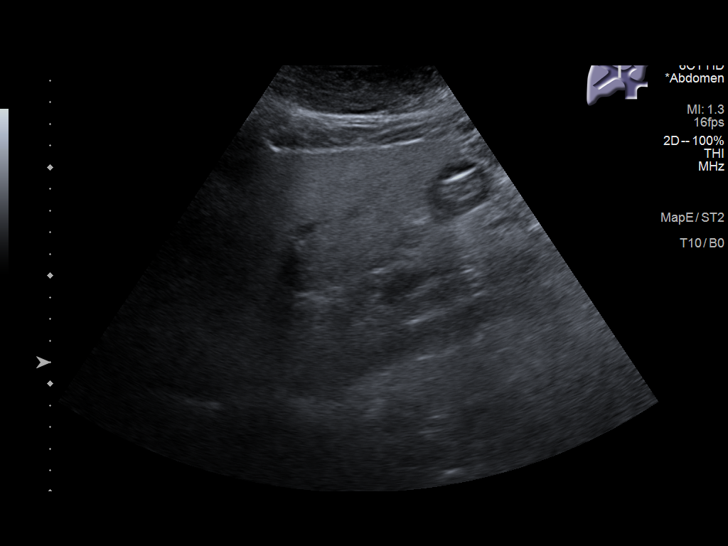
[im 47/47]
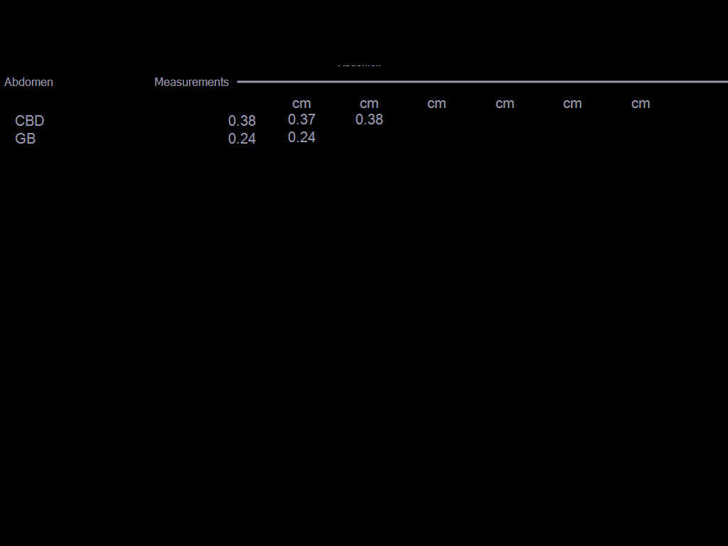

[14 of 25 positions shown; findings below may reference images not displayed]

FINDINGS: Gallbladder:

No gallstones or wall thickening visualized. No sonographic Murphy
sign noted by sonographer.

Common bile duct:

Diameter: 3.8 mm

Liver:

Increased hepatic echogenicity compatible with hepatic steatosis.
This limits evaluation of the liver. Within the limits of the study,
there is no large focal abnormality or intrahepatic biliary
dilatation. Portal vein is patent on color Doppler imaging with
normal direction of blood flow towards the liver.

Other: No free fluid or ascites
IMPRESSION: Hepatic steatosis.  See above comment.

No other acute finding by ultrasound.

## 2020-01-16 ENCOUNTER — Encounter: Payer: Self-pay | Admitting: Oncology

## 2020-01-17 ENCOUNTER — Inpatient Hospital Stay: Payer: Medicare Other | Attending: Oncology

## 2020-01-17 ENCOUNTER — Inpatient Hospital Stay (HOSPITAL_BASED_OUTPATIENT_CLINIC_OR_DEPARTMENT_OTHER): Payer: Medicare Other | Admitting: Oncology

## 2020-01-17 ENCOUNTER — Other Ambulatory Visit: Payer: Self-pay

## 2020-01-17 ENCOUNTER — Encounter: Payer: Self-pay | Admitting: Oncology

## 2020-01-17 VITALS — BP 137/74 | HR 78 | Temp 98.2°F | Resp 20 | Ht 72.0 in | Wt 246.0 lb

## 2020-01-17 DIAGNOSIS — C9110 Chronic lymphocytic leukemia of B-cell type not having achieved remission: Secondary | ICD-10-CM | POA: Insufficient documentation

## 2020-01-17 DIAGNOSIS — F418 Other specified anxiety disorders: Secondary | ICD-10-CM | POA: Diagnosis not present

## 2020-01-17 DIAGNOSIS — E039 Hypothyroidism, unspecified: Secondary | ICD-10-CM | POA: Diagnosis not present

## 2020-01-17 DIAGNOSIS — E119 Type 2 diabetes mellitus without complications: Secondary | ICD-10-CM | POA: Diagnosis not present

## 2020-01-17 DIAGNOSIS — Z803 Family history of malignant neoplasm of breast: Secondary | ICD-10-CM | POA: Insufficient documentation

## 2020-01-17 DIAGNOSIS — Z90722 Acquired absence of ovaries, bilateral: Secondary | ICD-10-CM | POA: Diagnosis not present

## 2020-01-17 DIAGNOSIS — I10 Essential (primary) hypertension: Secondary | ICD-10-CM | POA: Insufficient documentation

## 2020-01-17 DIAGNOSIS — Z90710 Acquired absence of both cervix and uterus: Secondary | ICD-10-CM | POA: Diagnosis not present

## 2020-01-17 DIAGNOSIS — J45909 Unspecified asthma, uncomplicated: Secondary | ICD-10-CM | POA: Insufficient documentation

## 2020-01-17 DIAGNOSIS — Z79899 Other long term (current) drug therapy: Secondary | ICD-10-CM | POA: Insufficient documentation

## 2020-01-17 DIAGNOSIS — Z794 Long term (current) use of insulin: Secondary | ICD-10-CM | POA: Diagnosis not present

## 2020-01-17 LAB — CBC WITH DIFFERENTIAL/PLATELET
Abs Immature Granulocytes: 0.02 10*3/uL (ref 0.00–0.07)
Basophils Absolute: 0 10*3/uL (ref 0.0–0.1)
Basophils Relative: 1 %
Eosinophils Absolute: 0.2 10*3/uL (ref 0.0–0.5)
Eosinophils Relative: 4 %
HCT: 32.9 % — ABNORMAL LOW (ref 36.0–46.0)
Hemoglobin: 10.9 g/dL — ABNORMAL LOW (ref 12.0–15.0)
Immature Granulocytes: 1 %
Lymphocytes Relative: 37 %
Lymphs Abs: 1.6 10*3/uL (ref 0.7–4.0)
MCH: 26.5 pg (ref 26.0–34.0)
MCHC: 33.1 g/dL (ref 30.0–36.0)
MCV: 80 fL (ref 80.0–100.0)
Monocytes Absolute: 0.3 10*3/uL (ref 0.1–1.0)
Monocytes Relative: 7 %
Neutro Abs: 2.2 10*3/uL (ref 1.7–7.7)
Neutrophils Relative %: 50 %
Platelets: 213 10*3/uL (ref 150–400)
RBC: 4.11 MIL/uL (ref 3.87–5.11)
RDW: 14.4 % (ref 11.5–15.5)
WBC: 4.3 10*3/uL (ref 4.0–10.5)
nRBC: 0 % (ref 0.0–0.2)

## 2020-01-17 LAB — COMPREHENSIVE METABOLIC PANEL
ALT: 69 U/L — ABNORMAL HIGH (ref 0–44)
AST: 47 U/L — ABNORMAL HIGH (ref 15–41)
Albumin: 3.6 g/dL (ref 3.5–5.0)
Alkaline Phosphatase: 82 U/L (ref 38–126)
Anion gap: 10 (ref 5–15)
BUN: 10 mg/dL (ref 6–20)
CO2: 27 mmol/L (ref 22–32)
Calcium: 8.1 mg/dL — ABNORMAL LOW (ref 8.9–10.3)
Chloride: 103 mmol/L (ref 98–111)
Creatinine, Ser: 0.93 mg/dL (ref 0.44–1.00)
GFR calc Af Amer: >60 mL/min (ref >60)
GFR calc non Af Amer: >60 mL/min (ref >60)
Glucose, Bld: 312 mg/dL — ABNORMAL HIGH (ref 70–99)
Potassium: 3.3 mmol/L — ABNORMAL LOW (ref 3.5–5.1)
Sodium: 140 mmol/L (ref 135–145)
Total Bilirubin: 0.7 mg/dL (ref 0.3–1.2)
Total Protein: 6.1 g/dL — ABNORMAL LOW (ref 6.5–8.1)

## 2020-01-17 LAB — LACTATE DEHYDROGENASE: LDH: 176 U/L (ref 98–192)

## 2020-01-18 ENCOUNTER — Other Ambulatory Visit: Payer: Self-pay | Admitting: Family Medicine

## 2020-01-18 DIAGNOSIS — Z1231 Encounter for screening mammogram for malignant neoplasm of breast: Secondary | ICD-10-CM

## 2020-02-27 ENCOUNTER — Other Ambulatory Visit: Payer: Self-pay

## 2020-02-27 ENCOUNTER — Ambulatory Visit
Admission: RE | Admit: 2020-02-27 | Discharge: 2020-02-27 | Disposition: A | Discharge status: Home / Self Care | Payer: Medicare Other | Source: Ambulatory Visit | Attending: Family Medicine | Admitting: Family Medicine

## 2020-02-27 DIAGNOSIS — Z1231 Encounter for screening mammogram for malignant neoplasm of breast: Secondary | ICD-10-CM | POA: Diagnosis not present

## 2020-02-27 IMAGING — MG DIGITAL SCREENING BILAT W/ TOMO W/ CAD
8 series · 8 of 24 positions shown · non-contrast
Comparison: Previous exam(s).

CLINICAL DATA: Screening.

EXAM:
DIGITAL SCREENING BILATERAL MAMMOGRAM WITH TOMO AND CAD

[L MLO synth-2D]
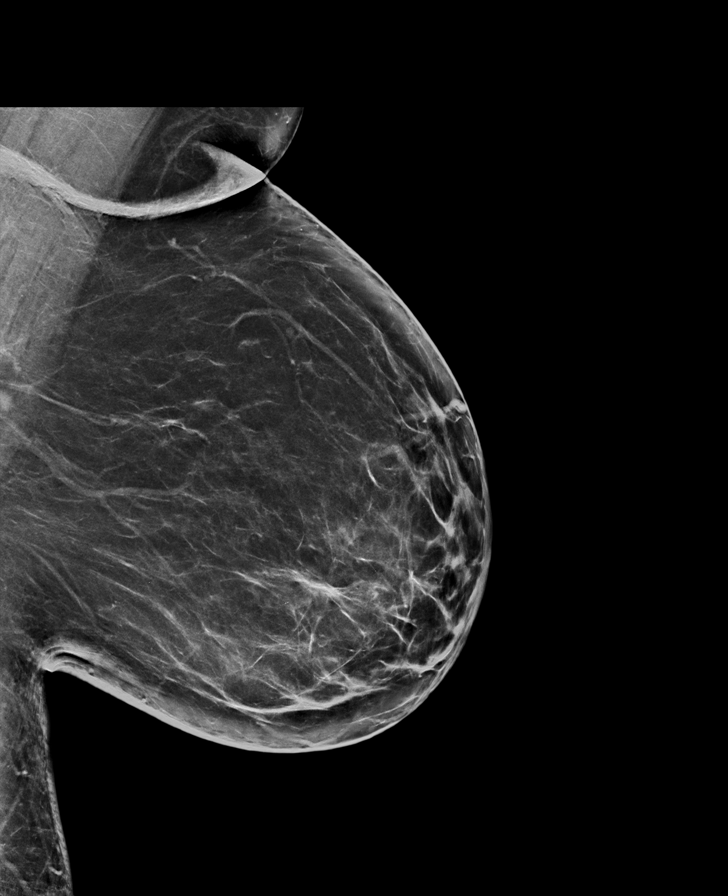

[L CC synth-2D]
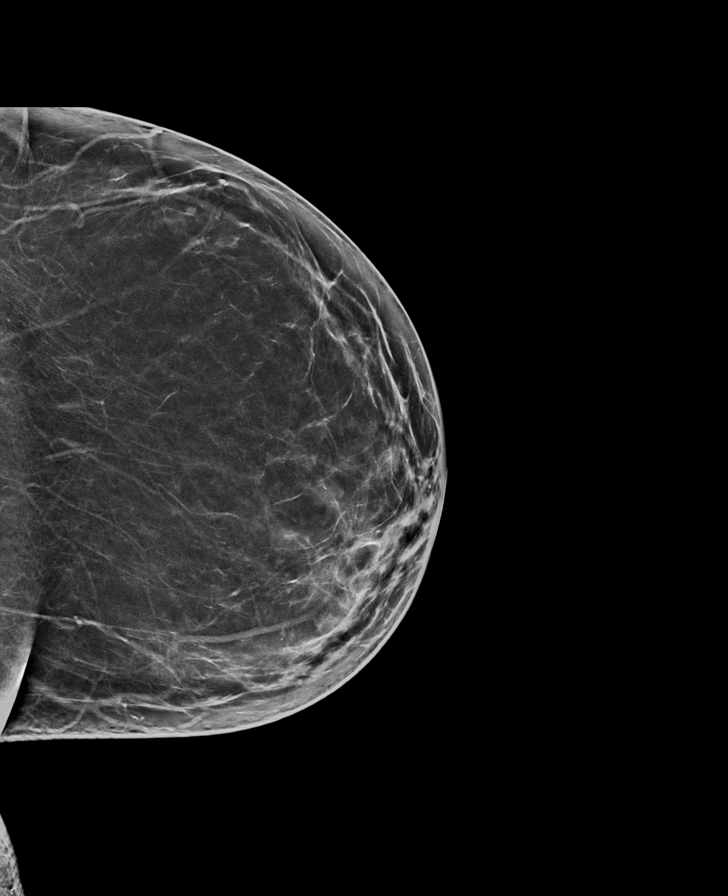

[R CC synth-2D]
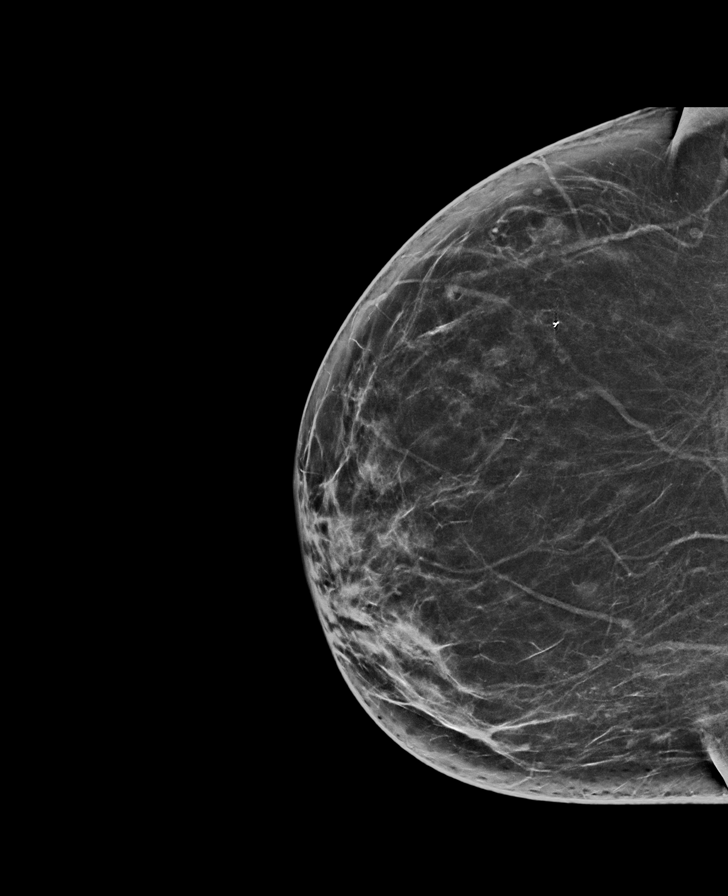

[R MLO synth-2D]
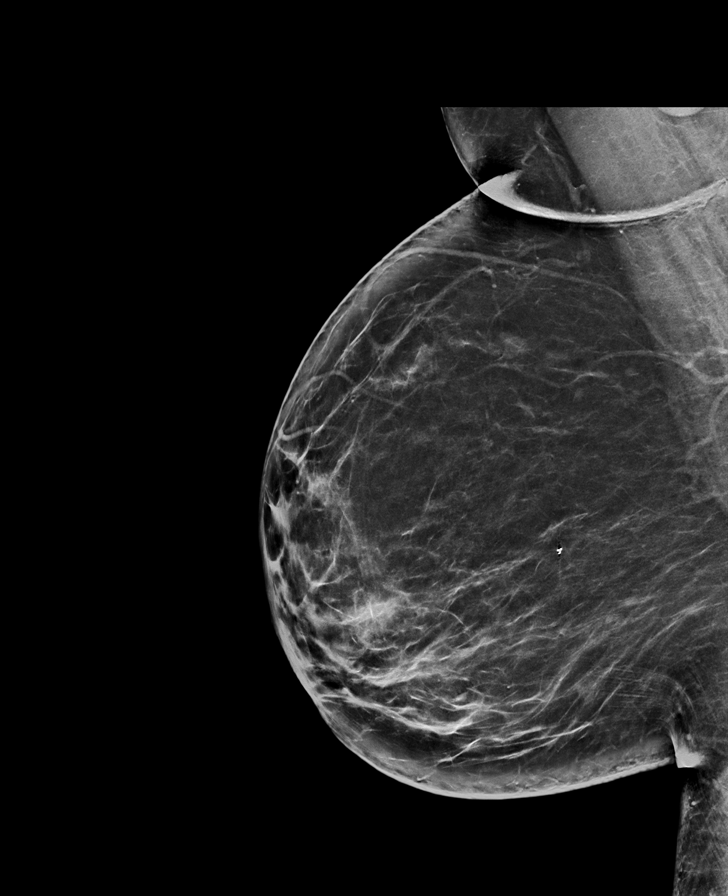

[L MLO tomo · tomo slice 43/84.0]
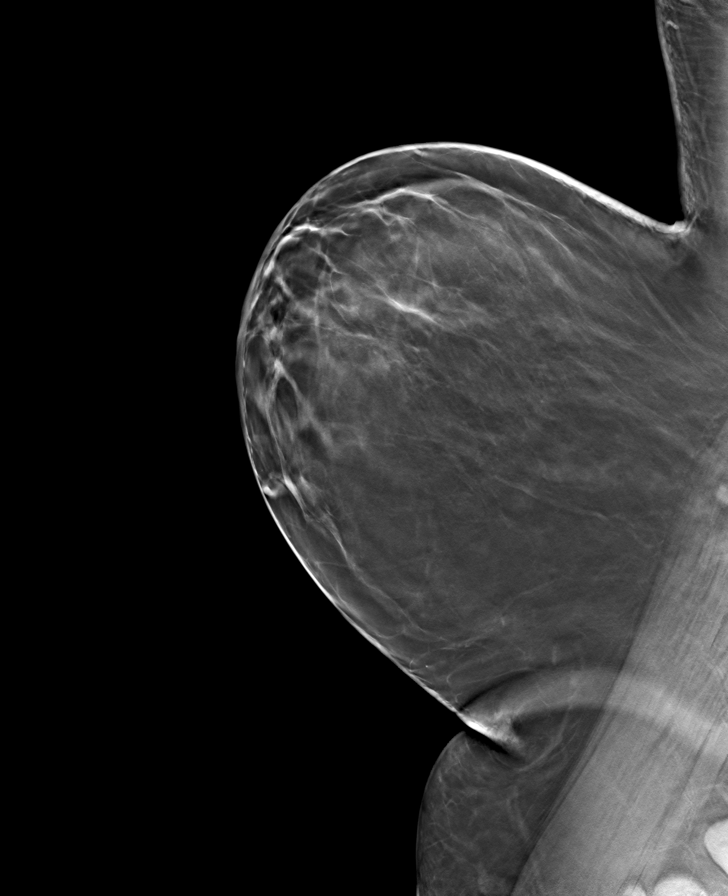

[L CC tomo · tomo slice 37/72.0]
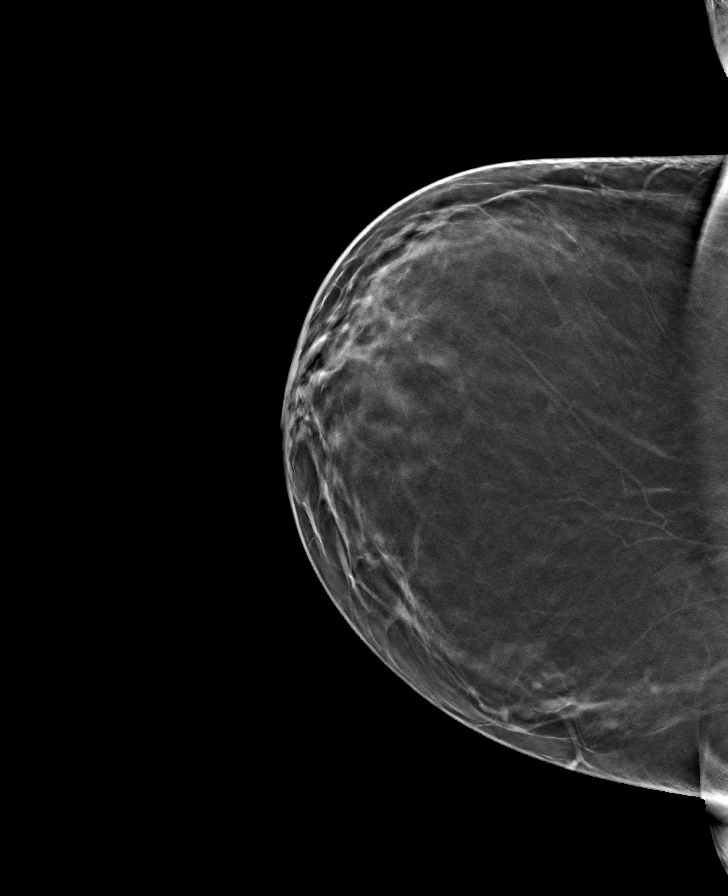

[R CC tomo · tomo slice 37/73.0]
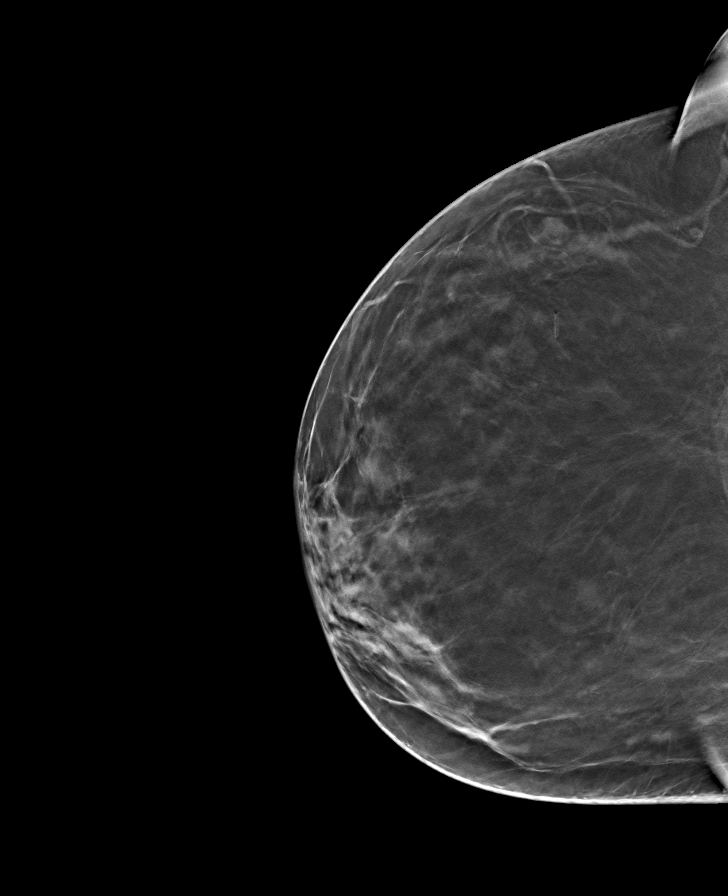

[R MLO tomo · tomo slice 41/82.0]
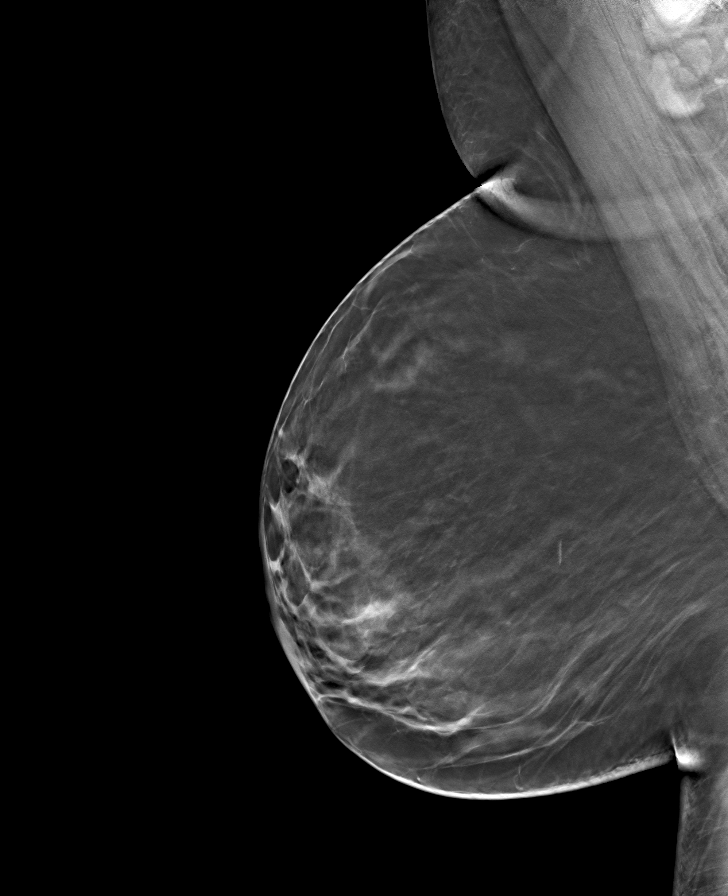

[8 of 24 positions shown; findings below may reference images not displayed]

ACR Breast Density Category b: There are scattered areas of
fibroglandular density.
FINDINGS: There are no findings suspicious for malignancy. Images were
processed with CAD.
IMPRESSION: No mammographic evidence of malignancy. A result letter of this
screening mammogram will be mailed directly to the patient.

RECOMMENDATION:
Screening mammogram in one year. (Code:[TQ])

BI-RADS CATEGORY  1: Negative.

## 2020-05-23 ENCOUNTER — Emergency Department
Admission: EM | Admit: 2020-05-23 | Discharge: 2020-05-23 | Disposition: A | Discharge status: Home / Self Care | Payer: Medicare Other | Attending: Emergency Medicine | Admitting: Emergency Medicine

## 2020-05-23 ENCOUNTER — Emergency Department: Payer: Medicare Other

## 2020-05-23 ENCOUNTER — Other Ambulatory Visit: Payer: Self-pay

## 2020-05-23 DIAGNOSIS — E1165 Type 2 diabetes mellitus with hyperglycemia: Secondary | ICD-10-CM | POA: Insufficient documentation

## 2020-05-23 DIAGNOSIS — I10 Essential (primary) hypertension: Secondary | ICD-10-CM | POA: Diagnosis not present

## 2020-05-23 DIAGNOSIS — Z20822 Contact with and (suspected) exposure to covid-19: Secondary | ICD-10-CM | POA: Diagnosis not present

## 2020-05-23 DIAGNOSIS — Z794 Long term (current) use of insulin: Secondary | ICD-10-CM | POA: Insufficient documentation

## 2020-05-23 DIAGNOSIS — R109 Unspecified abdominal pain: Secondary | ICD-10-CM | POA: Diagnosis not present

## 2020-05-23 DIAGNOSIS — Z7951 Long term (current) use of inhaled steroids: Secondary | ICD-10-CM | POA: Diagnosis not present

## 2020-05-23 DIAGNOSIS — Z79899 Other long term (current) drug therapy: Secondary | ICD-10-CM | POA: Diagnosis not present

## 2020-05-23 DIAGNOSIS — M5440 Lumbago with sciatica, unspecified side: Secondary | ICD-10-CM | POA: Diagnosis not present

## 2020-05-23 DIAGNOSIS — G8929 Other chronic pain: Secondary | ICD-10-CM | POA: Diagnosis not present

## 2020-05-23 DIAGNOSIS — J45909 Unspecified asthma, uncomplicated: Secondary | ICD-10-CM | POA: Diagnosis not present

## 2020-05-23 DIAGNOSIS — J168 Pneumonia due to other specified infectious organisms: Secondary | ICD-10-CM | POA: Diagnosis not present

## 2020-05-23 DIAGNOSIS — M545 Low back pain, unspecified: Secondary | ICD-10-CM | POA: Diagnosis present

## 2020-05-23 DIAGNOSIS — Z859 Personal history of malignant neoplasm, unspecified: Secondary | ICD-10-CM | POA: Diagnosis not present

## 2020-05-23 DIAGNOSIS — E039 Hypothyroidism, unspecified: Secondary | ICD-10-CM | POA: Diagnosis not present

## 2020-05-23 DIAGNOSIS — J189 Pneumonia, unspecified organism: Secondary | ICD-10-CM

## 2020-05-23 LAB — URINALYSIS, COMPLETE (UACMP) WITH MICROSCOPIC
Bilirubin Urine: NEGATIVE
Glucose, UA: NEGATIVE mg/dL
Hgb urine dipstick: NEGATIVE
Ketones, ur: NEGATIVE mg/dL
Leukocytes,Ua: NEGATIVE
Nitrite: NEGATIVE
Protein, ur: 30 mg/dL — AB
Specific Gravity, Urine: 1.018 (ref 1.005–1.030)
pH: 5 (ref 5.0–8.0)

## 2020-05-23 LAB — BASIC METABOLIC PANEL
Anion gap: 13 (ref 5–15)
BUN: 16 mg/dL (ref 6–20)
CO2: 23 mmol/L (ref 22–32)
Calcium: 9.3 mg/dL (ref 8.9–10.3)
Chloride: 102 mmol/L (ref 98–111)
Creatinine, Ser: 0.93 mg/dL (ref 0.44–1.00)
GFR, Estimated: >60 mL/min (ref >60)
Glucose, Bld: 186 mg/dL — ABNORMAL HIGH (ref 70–99)
Potassium: 3.7 mmol/L (ref 3.5–5.1)
Sodium: 138 mmol/L (ref 135–145)

## 2020-05-23 LAB — CBC
HCT: 34.5 % — ABNORMAL LOW (ref 36.0–46.0)
Hemoglobin: 11 g/dL — ABNORMAL LOW (ref 12.0–15.0)
MCH: 26.1 pg (ref 26.0–34.0)
MCHC: 31.9 g/dL (ref 30.0–36.0)
MCV: 81.8 fL (ref 80.0–100.0)
Platelets: 285 10*3/uL (ref 150–400)
RBC: 4.22 MIL/uL (ref 3.87–5.11)
RDW: 15.4 % (ref 11.5–15.5)
WBC: 10.3 10*3/uL (ref 4.0–10.5)
nRBC: 0 % (ref 0.0–0.2)

## 2020-05-23 LAB — SARS CORONAVIRUS 2 BY RT PCR (HOSPITAL ORDER, PERFORMED IN ~~LOC~~ HOSPITAL LAB): SARS Coronavirus 2: NEGATIVE

## 2020-05-23 IMAGING — CT CT ABD-PELV W/ CM
2 of 5 series · 15 of 46 positions shown, 17 images · IV contrast (APPLIED)
Comparison: Abdominal ultrasound [DATE] and CT abdomen from
[DATE]

CLINICAL DATA: Left flank pain radiating down both legs. Dizziness.

EXAM:
CT ABDOMEN AND PELVIS WITH CONTRAST
TECHNIQUE: Multidetector CT imaging of the abdomen and pelvis was performed
using the standard protocol following bolus administration of
intravenous contrast.
CONTRAST:  100mL OMNIPAQUE IOHEXOL 300 MG/ML  SOLN

[Series 2: routine abd/pel with · axial · 0.92mm/px · z∈[+200,+660]mm · 12 of 104 slices shown, 14 images]
[im 6/104  soft-tissue]
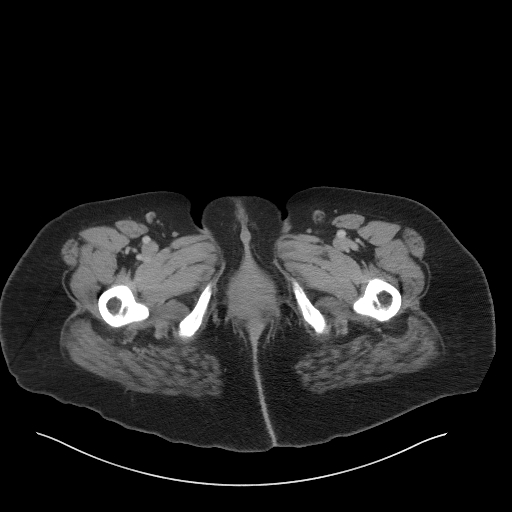
[im 6/104  bone]
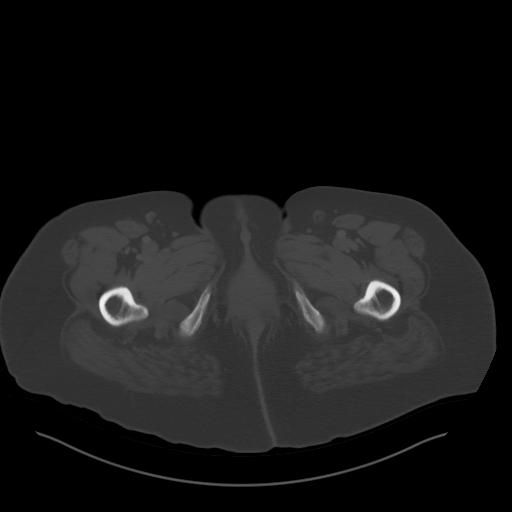
[im 17/104  soft-tissue]
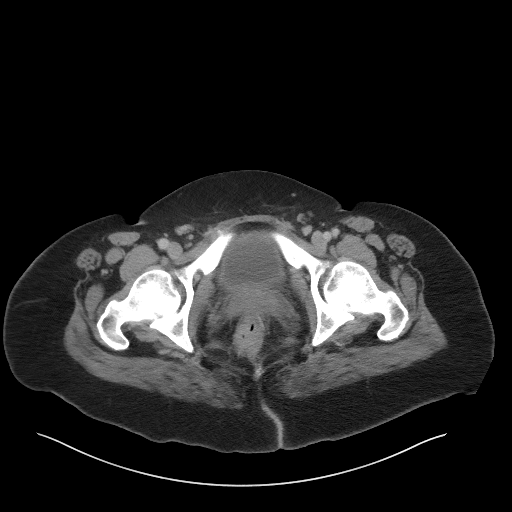
[im 22/104  soft-tissue]
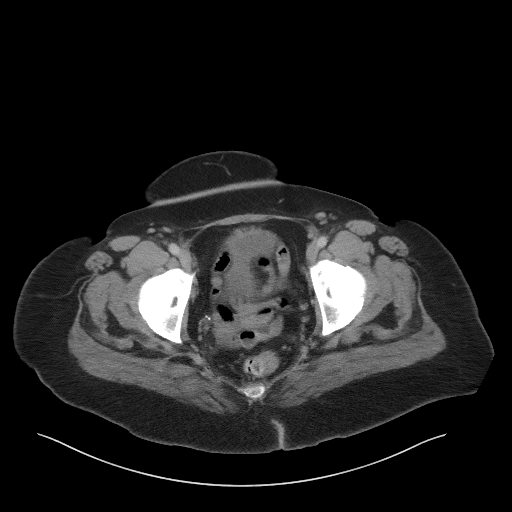
[im 33/104  soft-tissue]
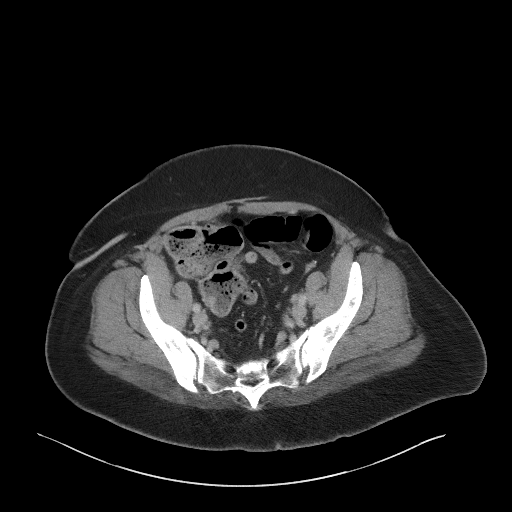
[im 38/104  soft-tissue]
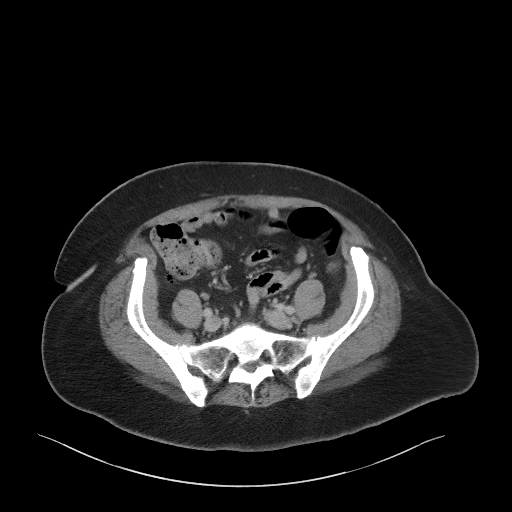
[im 49/104  soft-tissue]
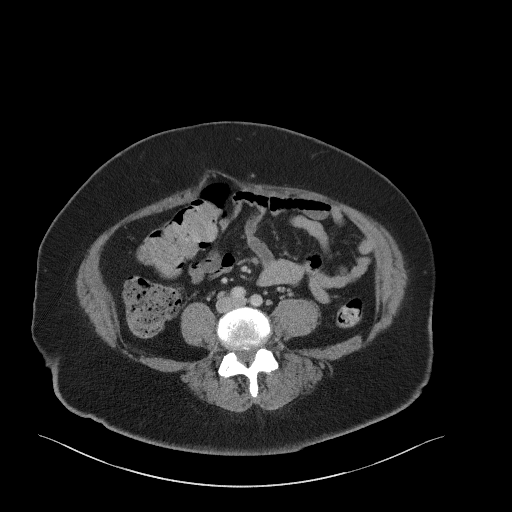
[im 55/104  soft-tissue]
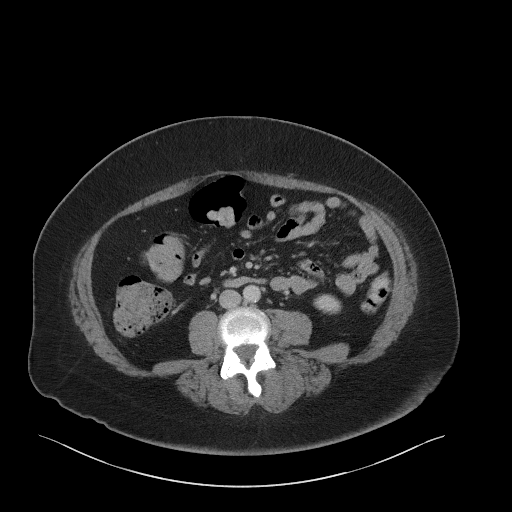
[im 66/104  soft-tissue]
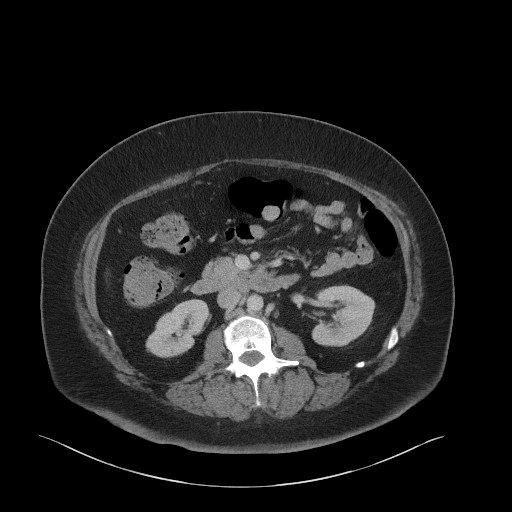
[im 71/104  soft-tissue]
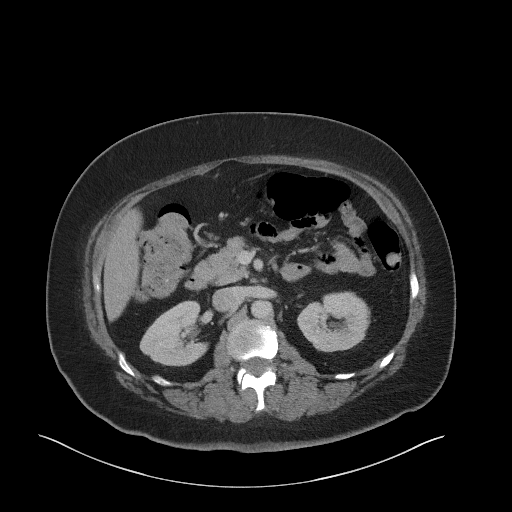
[im 71/104  bone]
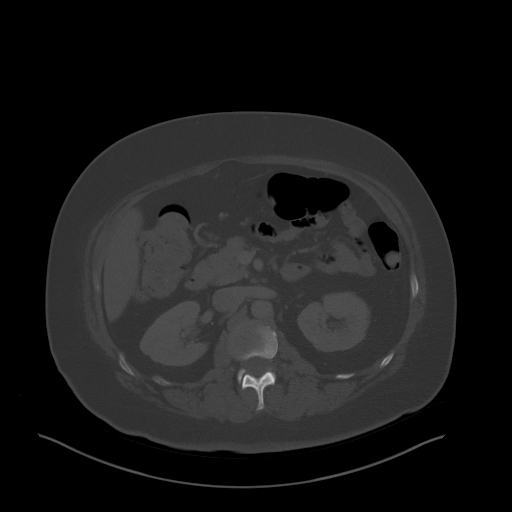
[im 82/104  soft-tissue]
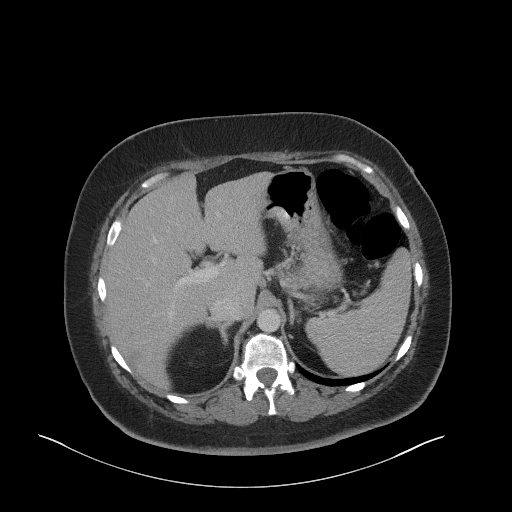
[im 87/104  soft-tissue]
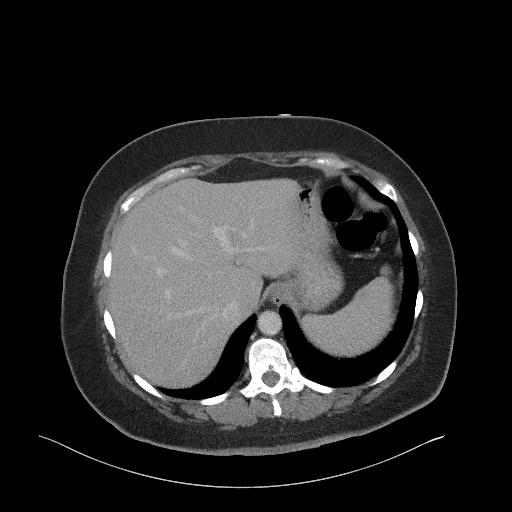
[im 98/104  soft-tissue]
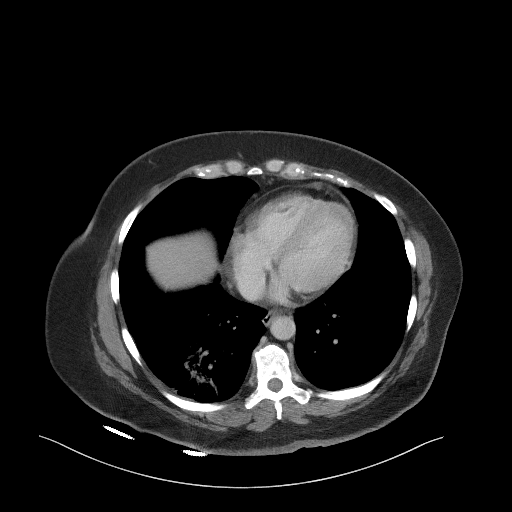

[Series 5: coronal st · coronal · 0.90mm/px · 3 of 101 slices shown]
[im 34/101  soft-tissue]
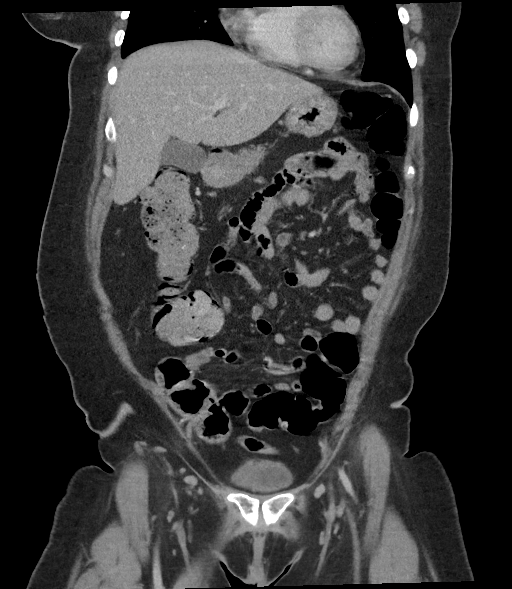
[im 45/101  soft-tissue]
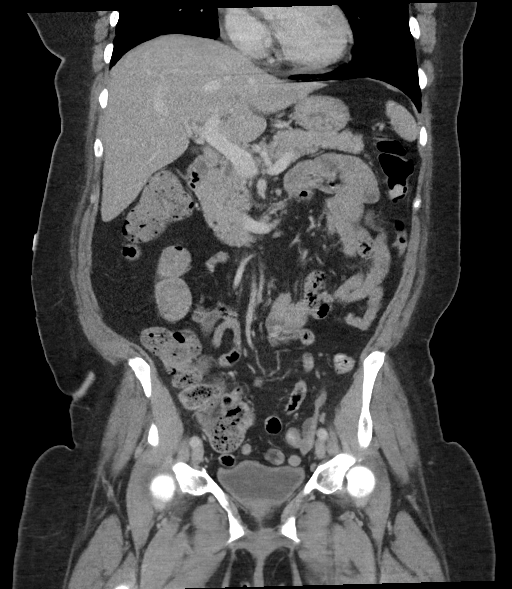
[im 56/101  soft-tissue]
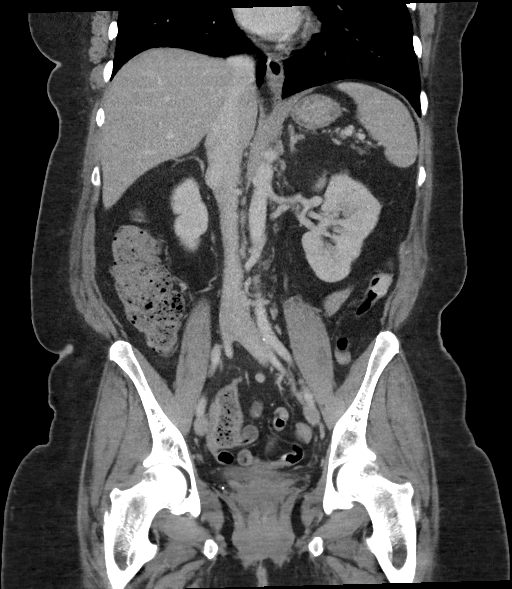

[15 of 46 positions shown; findings below may reference images not displayed]

FINDINGS: Lower chest: Irregular volume loss and some consolidation in an
approximately 6.5 by 3.4 by 5.8 cm region of the posterior basal
segment right lower lobe. Given the volume loss some of this is
probably due to atelectasis but a component of broncho pneumonia is
not excluded.

Hepatobiliary: A mildly hypodense lesion in the right hepatic lobe
measuring 1.7 by 1.6 cm is present on image 22 of series 2 was not
readily apparent on the prior exam of [DATE]. This lesion is not
readily apparent on the delayed images.

An adjacent mildly hypodense right hepatic lobe lesion measuring
by 1.1 cm is present on image 24 series 2. This lesion appears
mildly hyperdense to the liver on the delayed images such image 8 of
series 7, making it a little bit more likely to represent a
hemangioma. Overall, however, these lesions are technically
nonspecific.

The gallbladder appears unremarkable.  No biliary dilatation.

Pancreas: Unremarkable

Spleen: Questionable 2.4 cm hypodense lesion inferiorly in the
spleen on image 31 of series 2, admittedly this finding is very
subtle.

Adrenals/Urinary Tract: Nonspecific 1.0 by 0.7 cm hypodense lesion
in the left mid kidney on image 20 of series 7, previously 0.7 by
0.5 cm back on [DATE]. The kidneys appear otherwise
unremarkable. Adrenal glands normal.

Stomach/Bowel: Unremarkable

Vascular/Lymphatic: Mild aortoiliac atherosclerotic vascular
disease. Small retroperitoneal lymph nodes are not pathologically
enlarged by size criteria. Similarly, small pelvic lymph nodes are
not pathologically enlarged.

Reproductive: Prior supracervical hysterectomy. Adnexa unremarkable.

Other: No supplemental non-categorized findings.

Musculoskeletal: Lower lumbar spondylosis and degenerative disc
disease. Small umbilical hernia contains adipose tissue.
IMPRESSION: 1. Irregular volume loss and some consolidation in an approximately
6.5 by 3.4 by 5.8 cm region of the posterior basal segment right
lower lobe. Given the volume loss some of this is probably due to
atelectasis but a component of broncho pneumonia is not excluded.
2. Very subtle hypodense lesion in the inferior spleen. Given the
history of chronic lymphocytic leukemia, and also given the
indeterminate hypodense hepatic lesions which may be new compared to
the prior exam, I would recommend nonurgent hepatic protocol MRI
with and without contrast further investigate the hepatic and
splenic lesions.
3. Nonspecific 1.0 by 0.7 cm hypodense lesion in the left mid kidney
has mildly enlarged compared to [DATE]. This is probably a cyst
but could also be further assessed at MRI.
4. Lower lumbar spondylosis and degenerative disc disease.
5. Small umbilical hernia contains adipose tissue.
6. Aortic atherosclerosis.

Aortic Atherosclerosis ([MC]-[MC]).

## 2020-05-23 IMAGING — DX DG CHEST 1V PORT
1 series · 1 of 1 positions shown · non-contrast
Comparison: [DATE]

CLINICAL DATA: Cough

EXAM:
PORTABLE CHEST 1 VIEW

[chest ap]
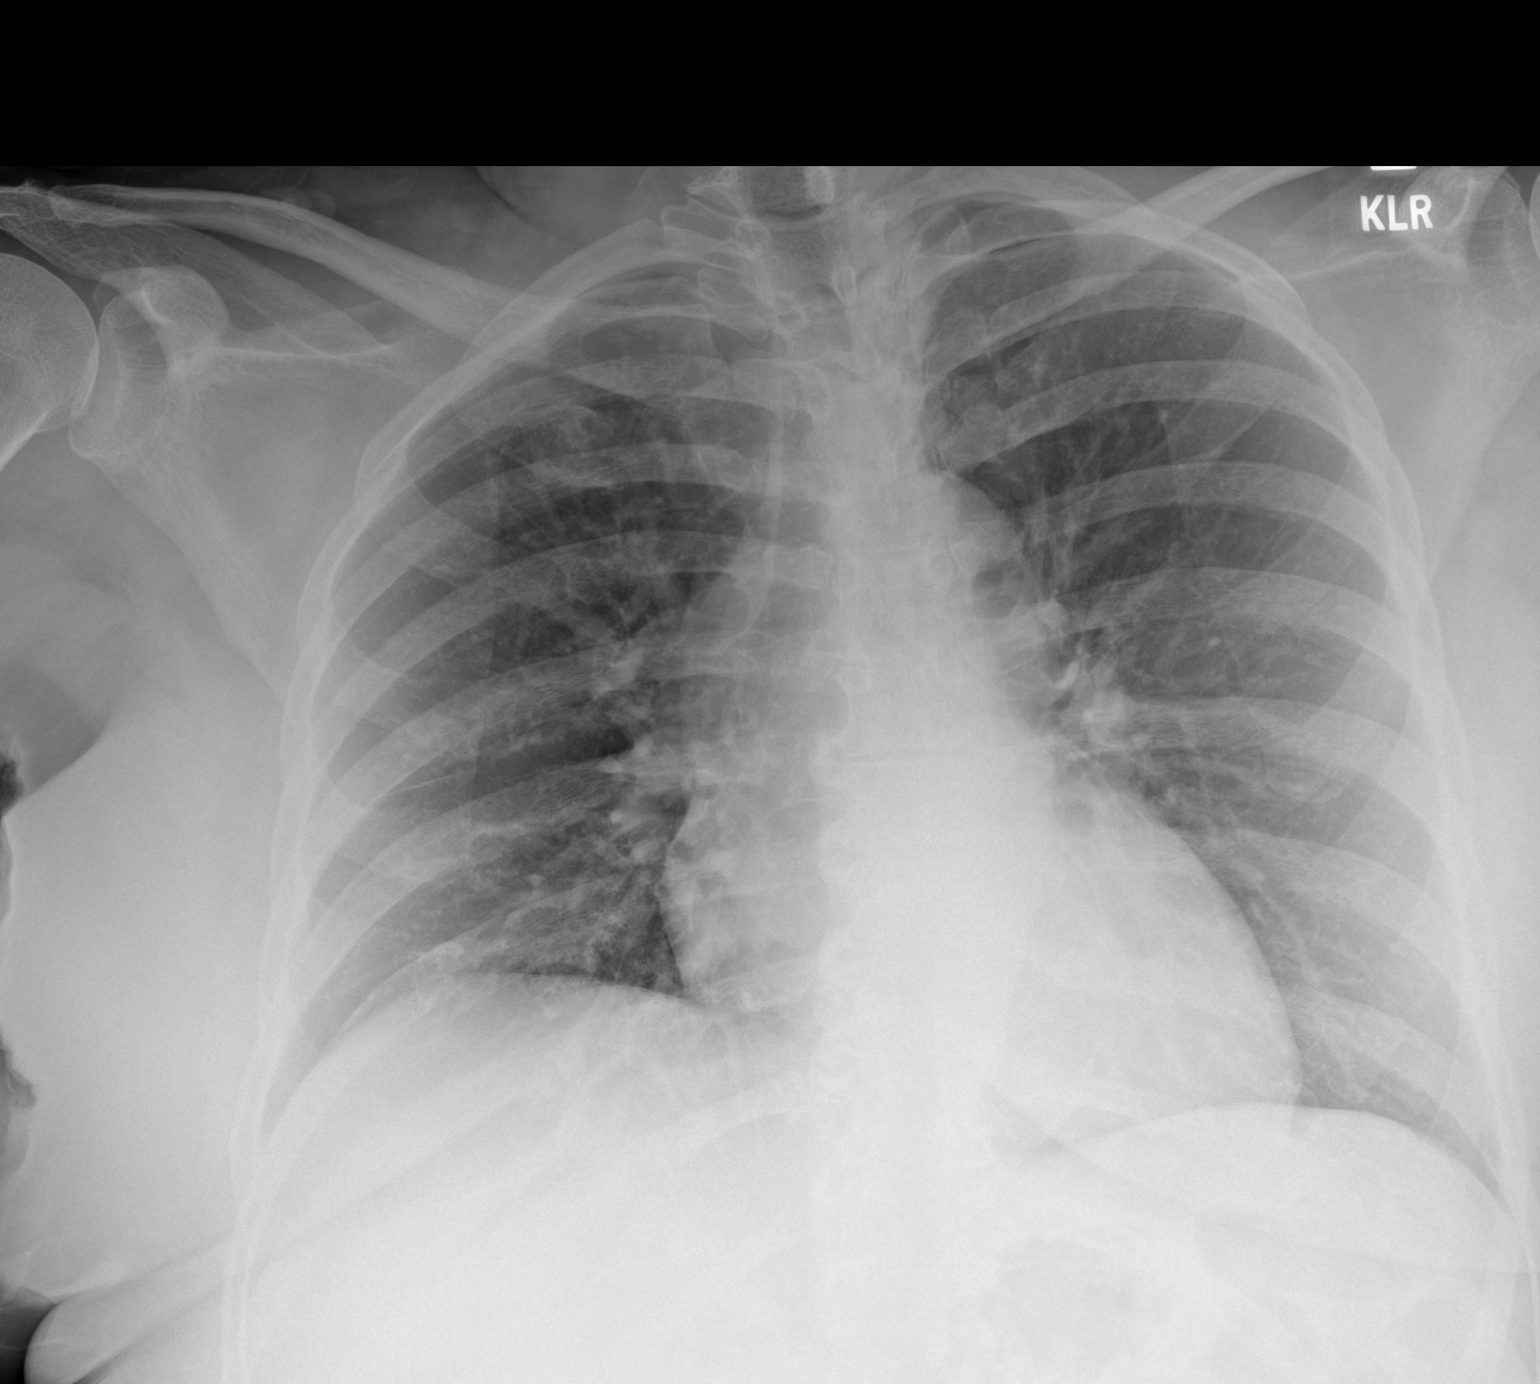

[1 of 1 positions shown; findings below may reference images not displayed]

FINDINGS: The heart size and mediastinal contours are within normal limits.
Both lungs are clear. The visualized skeletal structures are
unremarkable.
IMPRESSION: No active disease.

## 2020-05-23 MED ORDER — ACETAMINOPHEN 325 MG PO TABS
ORAL_TABLET | ORAL | Status: AC
  Filled 2020-05-23: qty 2

## 2020-05-23 MED ORDER — METHYLPREDNISOLONE SODIUM SUCC 125 MG IJ SOLR
125.0000 mg | Freq: Once | INTRAMUSCULAR | Status: AC
  Administered 2020-05-23: 125 mg via INTRAVENOUS
  Filled 2020-05-23: qty 2

## 2020-05-23 MED ORDER — IOHEXOL 300 MG/ML  SOLN
100.0000 mL | Freq: Once | INTRAMUSCULAR | Status: AC | PRN
  Administered 2020-05-23: 100 mL via INTRAVENOUS

## 2020-05-23 MED ORDER — AZITHROMYCIN 500 MG PO TABS
500.0000 mg | ORAL_TABLET | Freq: Once | ORAL | Status: AC
  Administered 2020-05-23: 500 mg via ORAL
  Filled 2020-05-23: qty 1

## 2020-05-23 MED ORDER — ACETAMINOPHEN 325 MG PO TABS
650.0000 mg | ORAL_TABLET | Freq: Once | ORAL | Status: AC | PRN
  Administered 2020-05-23: 650 mg via ORAL

## 2020-05-23 MED ORDER — GABAPENTIN 300 MG PO CAPS
300.0000 mg | ORAL_CAPSULE | Freq: Once | ORAL | Status: AC
  Administered 2020-05-23: 300 mg via ORAL
  Filled 2020-05-23: qty 1

## 2020-05-23 MED ORDER — LIDOCAINE 5 % EX PTCH
1.0000 | MEDICATED_PATCH | CUTANEOUS | Status: DC
  Administered 2020-05-23: 1 via TRANSDERMAL
  Filled 2020-05-23: qty 1

## 2020-05-23 MED ORDER — PREDNISONE 10 MG (21) PO TBPK: ORAL_TABLET | ORAL | 0 refills | Status: DC

## 2020-05-23 MED ORDER — HALOPERIDOL LACTATE 5 MG/ML IJ SOLN
5.0000 mg | Freq: Once | INTRAMUSCULAR | Status: AC
  Administered 2020-05-23: 5 mg via INTRAVENOUS
  Filled 2020-05-23: qty 1

## 2020-05-23 MED ORDER — AZITHROMYCIN 250 MG PO TABS: ORAL_TABLET | ORAL | 0 refills | Status: DC

## 2020-05-23 NOTE — ED Notes (Signed)
Pt provided applesauce and graham crackers with peanut butter and sched meds administered.  Denies any additional questions concerns needs at this time.

## 2020-05-23 NOTE — ED Notes (Signed)
Pt placed on 2L O2 via [place] with improvement to 100% due to  O2 sats on RA 88-91% while pt resting - when aroused pt reports Haldol provided mild relief for back pain however still rates pain 10/10.  Pt requesting to eat at this time as well; pt denies nausea - per Dr Archie Balboa pt clear to eat

## 2020-05-23 NOTE — Discharge Instructions (Addendum)
As we discussed it is very important that you check your blood sugars frequently when you are on steroids, they can raise your blood sugar significantly. Please talk to your doctor about obtaining an MRI of your spleen and kidney. Please return to the emergency department for any issues with urinary retention, inability to sense or control bowel movements, numbness to your legs or any other new or concerning symptoms.

## 2020-05-23 NOTE — ED Provider Notes (Signed)
[name] Emergency Department Provider Note   ____________________________________________   I have reviewed the triage vital signs and the nursing notes.   HISTORY  Chief Complaint Dizziness and Back Pain   History limited by: Not Limited   HPI Tonya Myers is a 55 y.o. female who presents to the emergency department today because of concern for left lower back pain. Patient states that she was instructed to come to the hospital by her primary care provider because of the pain but is unsure what their concern was for. She does state that this pain comes and goes and that she has had injection in the past and was scheduled to get another one. She says the pain is severe and makes ambulation difficult and that she has pain radiating down her left leg. In addition the patient has had some cough recently as well as nausea and decreased oral intake. When she discussed this with her doctor 2 days ago it was recommended she obtain a covid test, however this did not happen.   Records reviewed. Per medical record review patient has a history of chronic back pain.  Past Medical History:  Diagnosis Date  . Acute hemorrhoid 03/11/2015  . Anxiety   . Arthritis   . Asthma   . Cancer ([place])   . Chronic back pain   . CLL (chronic lymphocytic leukemia) ([place])   . Depression   . Diabetes mellitus without complication ([place])   . Genital herpes    type 2  . Hypertension   . Hypothyroidism   . Vertigo     Patient Active Problem List   Diagnosis Date Noted  . Bacterial vaginosis 11/01/2016  . Chronic pain in right shoulder 03/23/2016  . Numbness and tingling 03/23/2016  . Uncontrolled type 2 diabetes mellitus with hyperglycemia, without long-term current use of insulin ([place]) 10/27/2015  . DDD (degenerative disc disease), cervical 07/29/2015  . Cervical disc disorder with radiculopathy of cervical region 07/29/2015  . Cervical facet syndrome 07/29/2015  . DDD  (degenerative disc disease), lumbar 07/29/2015  . Facet syndrome, lumbar 07/29/2015  . Bilateral occipital neuralgia 07/29/2015  . Sacroiliac joint dysfunction 07/29/2015  . DJD of shoulder 07/29/2015  . Herpes simplex vulvovaginitis 05/29/2015  . Acute hemorrhoid 03/11/2015  . CLL (chronic lymphocytic leukemia) ([place]) 01/15/2015  . Acquired hypothyroidism 12/26/2014  . Arthritis 12/26/2014  . Carpal tunnel syndrome, bilateral 12/26/2014  . Controlled type 2 diabetes mellitus without complication, without long-term current use of insulin ([place]) 12/26/2014  . Essential hypertension 12/26/2014  . Hyperlipidemia, mixed 12/26/2014    Past Surgical History:  Procedure Laterality Date  . ABDOMINAL HYSTERECTOMY    . BILATERAL SALPINGOOPHORECTOMY  2009  . BREAST BIOPSY Right 05/17/2016   FIBROADENOMATOUS CHANGE AND SCLEROSING ADENOSIS WITH  . COLONOSCOPY WITH PROPOFOL N/A 06/16/2015   Procedure: COLONOSCOPY WITH PROPOFOL;  Surgeon: [name], MD;  Location: [facility] ENDOSCOPY;  Service: Endoscopy;  Laterality: N/A;  . LAPAROSCOPIC SUPRACERVICAL HYSTERECTOMY  2009   due to [place]  . OOPHORECTOMY      Prior to Admission medications   Medication Sig Start Date End Date Taking? Authorizing Provider  albuterol (VENTOLIN HFA) 108 (90 Base) MCG/ACT inhaler Inhale 1 puff into the lungs every 6 (six) hours as needed for wheezing or shortness of breath. Patient taking differently: Inhale 1 puff into the lungs in the morning and at bedtime.  03/15/19 03/14/20  [name], DO  Blood Glucose Monitoring Suppl (FIFTY50 GLUCOSE METER 2.0) w/Device KIT  02/25/15   [provider]  diltiazem (DILACOR XR) 120 MG 24 hr capsule Take 120 mg by mouth daily.  12/26/14   [provider]  estradiol (ESTRACE) 0.5 MG tablet  03/14/18   [provider]  ezetimibe (ZETIA) 10 MG tablet Take 10 mg by mouth daily.     [provider]  fluticasone (FLONASE) 50 MCG/ACT nasal spray  Place 1 spray into both nostrils in the morning and at bedtime.    [provider]  furosemide (LASIX) 20 MG tablet Take 10 mg by mouth daily. Reported on 07/29/2015    [provider]  gabapentin (NEURONTIN) 400 MG capsule 400 mg. 08/28/18   [provider]  Insulin Glargine-Lixisenatide 100-33 UNT-MCG/ML SOPN Inject 24 Units into the skin daily before breakfast. 01/07/20   [provider]  insulin lispro (HUMALOG) 100 UNIT/ML injection Inject into the skin 3 (three) times daily before meals. Sliding scale average of 10 units    [provider]  Lancets Misc. (UNISTIK 2 NORMAL) MISC 1 each. 02/25/15   [provider]  levothyroxine (SYNTHROID, LEVOTHROID) 150 MCG tablet Take 150 mcg by mouth daily before breakfast.     [provider]  montelukast (SINGULAIR) 10 MG tablet Take 10 mg by mouth at bedtime.    [provider]  Jonetta Speak LANCETS 01U MISC USE THREE TIMES A DAY. USE AS INSTRUCTED 04/06/16   [provider]  telmisartan (MICARDIS) 80 MG tablet Take 80 mg by mouth daily.    [provider]  valACYclovir (VALTREX) 1000 MG tablet Take 1 tablet (1,000 mg total) by mouth 2 (two) times daily. 07/20/18   [name], MD  budesonide-formoterol (SYMBICORT) 160-4.5 MCG/ACT inhaler Inhale into the lungs. 07/14/18 03/15/19  [provider]    Allergies Patient has no known allergies.  Family History  Problem Relation Age of Onset  . Cancer Paternal Aunt   . Breast cancer Maternal Aunt 70    Social History Social History   Tobacco Use  . Smoking status: Never Smoker  . Smokeless tobacco: Never Used  Vaping Use  . Vaping Use: Never used  Substance Use Topics  . Alcohol use: Yes    Alcohol/week: 1.0 standard drink    Types: 1 Cans of beer per week  . Drug use: No    Review of Systems Constitutional: Positive for fever. Eyes: No visual changes. ENT: No sore  throat. Cardiovascular: Denies chest pain. Respiratory: Denies shortness of breath. Gastrointestinal: No abdominal pain.  Positive for nausea.    Genitourinary: Negative for dysuria. Musculoskeletal: Positive for left lower back pain. Skin: Negative for rash. Neurological: Negative for headaches, focal weakness or numbness.  ____________________________________________   PHYSICAL EXAM:  VITAL SIGNS: ED Triage Vitals  Enc Vitals Group     BP 05/23/20 1521 107/69     Pulse Rate 05/23/20 1521 (!) 101     Resp 05/23/20 1521 18     Temp 05/23/20 1521 (!) 103 F (39.4 C)     Temp Source 05/23/20 1521 Oral     SpO2 05/23/20 1525 95 %     Weight 05/23/20 1522 240 lb (108.9 kg)     Height 05/23/20 1522 6' (1.829 m)     Head Circumference --      Peak Flow --      Pain Score 05/23/20 1522 10   Constitutional: Alert and oriented.  Eyes: Conjunctivae are normal.  ENT      Head: Normocephalic and  atraumatic.      Nose: No congestion/rhinnorhea.      Mouth/Throat: Mucous membranes are moist.      Neck: No stridor. Hematological/Lymphatic/Immunilogical: No cervical lymphadenopathy. Cardiovascular: Normal rate, regular rhythm.  No murmurs, rubs, or gallops.  Respiratory: Normal respiratory effort without tachypnea nor retractions. Breath sounds are clear and equal bilaterally. No wheezes/rales/rhonchi. Gastrointestinal: Soft and non tender. No rebound. No guarding.  Genitourinary: Deferred Musculoskeletal: Normal range of motion in all extremities. No lower extremity edema. Neurologic:  Normal speech and language. No gross focal neurologic deficits are appreciated.  Skin:  Skin is warm, dry and intact. No rash noted. Psychiatric: Mood and affect are normal. Speech and behavior are normal. Patient exhibits appropriate insight and judgment.  ____________________________________________    LABS (pertinent positives/negatives)  CBC wbc 10.3, hgb 11.0, plt 285 COVID negative UA hazy,  0-5 wbc, negative nitrite and leukocytes BMP wnl except glu 186  ____________________________________________   EKG  I, Nance Pear, attending physician, personally viewed and interpreted this EKG  EKG Time: 1522 Rate: 105 Rhythm: sinus tachycardia Axis: left axis deviation Intervals: qtc 483 QRS: narrow ST changes: no st elevation Impression: abnormal ekg  ____________________________________________    RADIOLOGY  CT abd/pel Right lower lobe consolidation and volume loss. Hypodense lesion in spleen. Lesion in left kidney. Degenerative disc disease  ____________________________________________   PROCEDURES  Procedures  ____________________________________________   INITIAL IMPRESSION / ASSESSMENT AND PLAN / ED COURSE  Pertinent labs & imaging results that were available during my care of the patient were reviewed by me and considered in my medical decision making (see chart for details).   Patient presented to the emergency department today because of concerns for left lower back pain.  She also had some complaints of cough.  Per chart review patient has been seen provider for her left lower back pain and had injection in the past.  Sounds like she is also having an injection scheduled for the near future.  Terms of the cough the patient discussed this with her primary care doctor and was recommended to get a Covid swab which she did not yet received.  Work-up here in the emergency department did show an initial fever however patient without leukocytosis.  Urine without concerning findings.  Patient did get a CT abdomen pelvis to make sure that we did not see kidney stone or possible diverticulitis or other intra-abdominal infection on the left side.  While this did not show any intra-abdominal infection I did show concerns for possible pneumonia.  This could explain the patient's fever and cough.  I discussed this with the patient.  In terms of the low back pain I do  think this is likely related to her disc disease.  At this time I have low suspicion for cauda equina.  We did discuss potentially obtaining an MRI however patient stated that she would defer at this time to follow-up with outside provider.  I do think this is reasonable again because my concern for cauda equina is quite low.  We did discuss treatment with steroids.  I do think this could help with inflammation from degenerative disc disease.  We did discuss concern given her diabetes that it could elevate her blood sugar and that she will have to take extremely good care with blood sugar control.  ____________________________________________   FINAL CLINICAL IMPRESSION(S) / ED DIAGNOSES  Final diagnoses:  Acute left-sided low back pain, unspecified whether sciatica present  Pneumonia due to infectious organism, unspecified laterality, unspecified  part of lung     Note: This dictation was prepared with Dragon dictation. Any transcriptional errors that result from this process are unintentional     Nance Pear, MD 05/23/20 2320

## 2020-05-23 NOTE — ED Notes (Signed)
Pt now returned from [place] via stretcher- facial grimacing and tearfulness noted; per CT tech "pt in a lot of pain" but was able to complete CT

## 2020-05-23 NOTE — ED Notes (Signed)
Pt agreeable with d/c plan as discussed by provider- this nurse has verbally reinforced d/c instructions and provided pt with written copy - pt acknowledges verbal understanding and denies any additional questions, concerns, needs.  Escorted to waiting room via w/c where pt will standby awaiting her rid e home-- triage staff nurse notified of pt status/location

## 2020-05-23 NOTE — ED Notes (Signed)
Pt awake, alert and oriented x4.  RR even and unlabored on RA with symmetrical rise and fall of chest.  Abdomen soft and round.  Pt continues to report ongoing back pain  -- states no relief with meds at home (Ibuprofen, Flexeril and Gabapentin) -- pain is 10/10 and worse with movement; described as spasmodic in nature.  Pt reports ongoing radiation to BLE with tingling to LLE however pt denies loss of sensation and moves BLE purposefully.  Pt also states intermittent urinary incontinence.  Pt now transported to CT via stretcher escorted by CT tech.

## 2020-05-23 NOTE — ED Triage Notes (Addendum)
Pt here from home via [facility].  Pt reports back pain that started suddenly and without injury three days ago. Pain radiates down both legs. Reports she has also been dizzy and light headed, most significantly with movement. Denies urinary symptoms. Denies any known covid contacts. No cough present or SHOB.  Ems VS- o2 sats 97% RA, bp 123/69, HR 97.

## 2020-05-25 ENCOUNTER — Telehealth: Payer: Self-pay | Admitting: Emergency Medicine

## 2020-05-25 MED ORDER — AZITHROMYCIN 250 MG PO TABS: ORAL_TABLET | ORAL | 0 refills | Status: AC

## 2020-05-25 MED ORDER — PREDNISONE 10 MG (21) PO TBPK
ORAL_TABLET | ORAL | 0 refills | Status: DC
Start: 2020-05-25 — End: 2020-06-10

## 2020-05-25 NOTE — Telephone Encounter (Signed)
Pt called stating that she was seen on Friday night and that the pharmacy that her prescriptions were sent to is not opened on the weekends. This RN spoke with Dr. Tamala Julian who gave [facility] to call RXs in to [facility] on [address] by [employer].   RX for Prednisone 10 mg taper with no Refills and Azithromycin (Z-pak) # 1 with no refill was called in by this RN and left on voice mail

## 2020-06-09 ENCOUNTER — Encounter: Payer: Self-pay | Admitting: Intensive Care

## 2020-06-09 ENCOUNTER — Emergency Department: Payer: Medicare Other

## 2020-06-09 ENCOUNTER — Inpatient Hospital Stay: Payer: Medicare Other

## 2020-06-09 ENCOUNTER — Other Ambulatory Visit: Payer: Self-pay

## 2020-06-09 ENCOUNTER — Inpatient Hospital Stay
Admission: EM | Admit: 2020-06-09 | Discharge: 2020-06-27 | DRG: 853 | Disposition: A | Discharge status: Rehabilitation Facility | Payer: Medicare Other | Attending: Internal Medicine | Admitting: Internal Medicine

## 2020-06-09 ENCOUNTER — Emergency Department
Admission: EM | Admit: 2020-06-09 | Discharge: 2020-06-09 | Disposition: A | Discharge status: Home / Self Care | Payer: Medicare Other | Source: Home / Self Care

## 2020-06-09 DIAGNOSIS — R52 Pain, unspecified: Secondary | ICD-10-CM | POA: Diagnosis not present

## 2020-06-09 DIAGNOSIS — Z5321 Procedure and treatment not carried out due to patient leaving prior to being seen by health care provider: Secondary | ICD-10-CM | POA: Insufficient documentation

## 2020-06-09 DIAGNOSIS — A419 Sepsis, unspecified organism: Secondary | ICD-10-CM | POA: Diagnosis not present

## 2020-06-09 DIAGNOSIS — G039 Meningitis, unspecified: Secondary | ICD-10-CM | POA: Diagnosis present

## 2020-06-09 DIAGNOSIS — E669 Obesity, unspecified: Secondary | ICD-10-CM | POA: Diagnosis present

## 2020-06-09 DIAGNOSIS — F419 Anxiety disorder, unspecified: Secondary | ICD-10-CM | POA: Diagnosis present

## 2020-06-09 DIAGNOSIS — Z0189 Encounter for other specified special examinations: Secondary | ICD-10-CM

## 2020-06-09 DIAGNOSIS — Z794 Long term (current) use of insulin: Secondary | ICD-10-CM

## 2020-06-09 DIAGNOSIS — M4620 Osteomyelitis of vertebra, site unspecified: Secondary | ICD-10-CM

## 2020-06-09 DIAGNOSIS — R6520 Severe sepsis without septic shock: Secondary | ICD-10-CM

## 2020-06-09 DIAGNOSIS — F329 Major depressive disorder, single episode, unspecified: Secondary | ICD-10-CM | POA: Diagnosis present

## 2020-06-09 DIAGNOSIS — G9340 Encephalopathy, unspecified: Secondary | ICD-10-CM

## 2020-06-09 DIAGNOSIS — A491 Streptococcal infection, unspecified site: Secondary | ICD-10-CM | POA: Diagnosis not present

## 2020-06-09 DIAGNOSIS — Z20822 Contact with and (suspected) exposure to covid-19: Secondary | ICD-10-CM | POA: Diagnosis present

## 2020-06-09 DIAGNOSIS — R7401 Elevation of levels of liver transaminase levels: Secondary | ICD-10-CM | POA: Diagnosis not present

## 2020-06-09 DIAGNOSIS — K5901 Slow transit constipation: Secondary | ICD-10-CM | POA: Diagnosis not present

## 2020-06-09 DIAGNOSIS — Z8616 Personal history of COVID-19: Secondary | ICD-10-CM

## 2020-06-09 DIAGNOSIS — I959 Hypotension, unspecified: Secondary | ICD-10-CM | POA: Diagnosis not present

## 2020-06-09 DIAGNOSIS — G062 Extradural and subdural abscess, unspecified: Secondary | ICD-10-CM

## 2020-06-09 DIAGNOSIS — D649 Anemia, unspecified: Secondary | ICD-10-CM | POA: Diagnosis not present

## 2020-06-09 DIAGNOSIS — M545 Low back pain, unspecified: Secondary | ICD-10-CM | POA: Insufficient documentation

## 2020-06-09 DIAGNOSIS — R441 Visual hallucinations: Secondary | ICD-10-CM | POA: Diagnosis not present

## 2020-06-09 DIAGNOSIS — M6008 Infective myositis, other site: Secondary | ICD-10-CM | POA: Diagnosis not present

## 2020-06-09 DIAGNOSIS — J45909 Unspecified asthma, uncomplicated: Secondary | ICD-10-CM | POA: Diagnosis present

## 2020-06-09 DIAGNOSIS — G061 Intraspinal abscess and granuloma: Secondary | ICD-10-CM | POA: Diagnosis not present

## 2020-06-09 DIAGNOSIS — E870 Hyperosmolality and hypernatremia: Secondary | ICD-10-CM | POA: Diagnosis not present

## 2020-06-09 DIAGNOSIS — M5030 Other cervical disc degeneration, unspecified cervical region: Secondary | ICD-10-CM | POA: Diagnosis present

## 2020-06-09 DIAGNOSIS — E039 Hypothyroidism, unspecified: Secondary | ICD-10-CM

## 2020-06-09 DIAGNOSIS — E876 Hypokalemia: Secondary | ICD-10-CM | POA: Diagnosis not present

## 2020-06-09 DIAGNOSIS — G9341 Metabolic encephalopathy: Secondary | ICD-10-CM | POA: Diagnosis present

## 2020-06-09 DIAGNOSIS — G959 Disease of spinal cord, unspecified: Secondary | ICD-10-CM | POA: Diagnosis not present

## 2020-06-09 DIAGNOSIS — R4781 Slurred speech: Secondary | ICD-10-CM | POA: Insufficient documentation

## 2020-06-09 DIAGNOSIS — E782 Mixed hyperlipidemia: Secondary | ICD-10-CM | POA: Diagnosis present

## 2020-06-09 DIAGNOSIS — E1165 Type 2 diabetes mellitus with hyperglycemia: Secondary | ICD-10-CM | POA: Diagnosis present

## 2020-06-09 DIAGNOSIS — M009 Pyogenic arthritis, unspecified: Secondary | ICD-10-CM | POA: Diagnosis present

## 2020-06-09 DIAGNOSIS — Z7951 Long term (current) use of inhaled steroids: Secondary | ICD-10-CM

## 2020-06-09 DIAGNOSIS — R262 Difficulty in walking, not elsewhere classified: Secondary | ICD-10-CM | POA: Insufficient documentation

## 2020-06-09 DIAGNOSIS — B9689 Other specified bacterial agents as the cause of diseases classified elsewhere: Secondary | ICD-10-CM | POA: Diagnosis not present

## 2020-06-09 DIAGNOSIS — R339 Retention of urine, unspecified: Secondary | ICD-10-CM | POA: Diagnosis not present

## 2020-06-09 DIAGNOSIS — G001 Pneumococcal meningitis: Secondary | ICD-10-CM

## 2020-06-09 DIAGNOSIS — M25561 Pain in right knee: Secondary | ICD-10-CM | POA: Diagnosis not present

## 2020-06-09 DIAGNOSIS — R7309 Other abnormal glucose: Secondary | ICD-10-CM | POA: Diagnosis not present

## 2020-06-09 DIAGNOSIS — E1110 Type 2 diabetes mellitus with ketoacidosis without coma: Secondary | ICD-10-CM | POA: Diagnosis present

## 2020-06-09 DIAGNOSIS — M5136 Other intervertebral disc degeneration, lumbar region: Secondary | ICD-10-CM | POA: Diagnosis present

## 2020-06-09 DIAGNOSIS — A6004 Herpesviral vulvovaginitis: Secondary | ICD-10-CM | POA: Diagnosis present

## 2020-06-09 DIAGNOSIS — C9110 Chronic lymphocytic leukemia of B-cell type not having achieved remission: Secondary | ICD-10-CM | POA: Diagnosis present

## 2020-06-09 DIAGNOSIS — R509 Fever, unspecified: Secondary | ICD-10-CM | POA: Diagnosis not present

## 2020-06-09 DIAGNOSIS — E1100 Type 2 diabetes mellitus with hyperosmolarity without nonketotic hyperglycemic-hyperosmolar coma (NKHHC): Secondary | ICD-10-CM | POA: Diagnosis not present

## 2020-06-09 DIAGNOSIS — E66811 Obesity, class 1: Secondary | ICD-10-CM

## 2020-06-09 DIAGNOSIS — M549 Dorsalgia, unspecified: Secondary | ICD-10-CM | POA: Diagnosis present

## 2020-06-09 DIAGNOSIS — M5430 Sciatica, unspecified side: Secondary | ICD-10-CM | POA: Diagnosis not present

## 2020-06-09 DIAGNOSIS — N179 Acute kidney failure, unspecified: Secondary | ICD-10-CM | POA: Diagnosis present

## 2020-06-09 DIAGNOSIS — G8929 Other chronic pain: Secondary | ICD-10-CM | POA: Diagnosis present

## 2020-06-09 DIAGNOSIS — D638 Anemia in other chronic diseases classified elsewhere: Secondary | ICD-10-CM | POA: Diagnosis not present

## 2020-06-09 DIAGNOSIS — B953 Streptococcus pneumoniae as the cause of diseases classified elsewhere: Secondary | ICD-10-CM | POA: Diagnosis not present

## 2020-06-09 DIAGNOSIS — G8220 Paraplegia, unspecified: Secondary | ICD-10-CM | POA: Diagnosis not present

## 2020-06-09 DIAGNOSIS — M5010 Cervical disc disorder with radiculopathy, unspecified cervical region: Secondary | ICD-10-CM | POA: Diagnosis not present

## 2020-06-09 DIAGNOSIS — G009 Bacterial meningitis, unspecified: Secondary | ICD-10-CM | POA: Diagnosis not present

## 2020-06-09 DIAGNOSIS — G479 Sleep disorder, unspecified: Secondary | ICD-10-CM | POA: Diagnosis not present

## 2020-06-09 DIAGNOSIS — E871 Hypo-osmolality and hyponatremia: Secondary | ICD-10-CM | POA: Diagnosis not present

## 2020-06-09 DIAGNOSIS — R4701 Aphasia: Secondary | ICD-10-CM

## 2020-06-09 DIAGNOSIS — Z856 Personal history of leukemia: Secondary | ICD-10-CM | POA: Diagnosis present

## 2020-06-09 DIAGNOSIS — D72829 Elevated white blood cell count, unspecified: Secondary | ICD-10-CM | POA: Diagnosis not present

## 2020-06-09 DIAGNOSIS — Z79899 Other long term (current) drug therapy: Secondary | ICD-10-CM

## 2020-06-09 DIAGNOSIS — A403 Sepsis due to Streptococcus pneumoniae: Principal | ICD-10-CM | POA: Diagnosis present

## 2020-06-09 DIAGNOSIS — I10 Essential (primary) hypertension: Secondary | ICD-10-CM | POA: Diagnosis present

## 2020-06-09 DIAGNOSIS — R7989 Other specified abnormal findings of blood chemistry: Secondary | ICD-10-CM | POA: Diagnosis not present

## 2020-06-09 DIAGNOSIS — E119 Type 2 diabetes mellitus without complications: Secondary | ICD-10-CM | POA: Diagnosis not present

## 2020-06-09 DIAGNOSIS — Z6833 Body mass index (BMI) 33.0-33.9, adult: Secondary | ICD-10-CM

## 2020-06-09 DIAGNOSIS — M1990 Unspecified osteoarthritis, unspecified site: Secondary | ICD-10-CM | POA: Diagnosis not present

## 2020-06-09 DIAGNOSIS — M62838 Other muscle spasm: Secondary | ICD-10-CM | POA: Diagnosis not present

## 2020-06-09 DIAGNOSIS — R7881 Bacteremia: Secondary | ICD-10-CM | POA: Diagnosis not present

## 2020-06-09 DIAGNOSIS — Z79890 Hormone replacement therapy (postmenopausal): Secondary | ICD-10-CM

## 2020-06-09 DIAGNOSIS — M47816 Spondylosis without myelopathy or radiculopathy, lumbar region: Secondary | ICD-10-CM | POA: Diagnosis not present

## 2020-06-09 DIAGNOSIS — Z419 Encounter for procedure for purposes other than remedying health state, unspecified: Secondary | ICD-10-CM

## 2020-06-09 DIAGNOSIS — Z1389 Encounter for screening for other disorder: Secondary | ICD-10-CM

## 2020-06-09 DIAGNOSIS — R1012 Left upper quadrant pain: Secondary | ICD-10-CM | POA: Diagnosis not present

## 2020-06-09 DIAGNOSIS — Z90710 Acquired absence of both cervix and uterus: Secondary | ICD-10-CM

## 2020-06-09 LAB — HEPATIC FUNCTION PANEL
ALT: 36 U/L (ref 0–44)
AST: 50 U/L — ABNORMAL HIGH (ref 15–41)
Albumin: 2.8 g/dL — ABNORMAL LOW (ref 3.5–5.0)
Alkaline Phosphatase: 200 U/L — ABNORMAL HIGH (ref 38–126)
Bilirubin, Direct: 1.2 mg/dL — ABNORMAL HIGH (ref 0.0–0.2)
Indirect Bilirubin: 0.9 mg/dL (ref 0.3–0.9)
Total Bilirubin: 2.1 mg/dL — ABNORMAL HIGH (ref 0.3–1.2)
Total Protein: 7.3 g/dL (ref 6.5–8.1)

## 2020-06-09 LAB — BASIC METABOLIC PANEL
Anion gap: 16 — ABNORMAL HIGH (ref 5–15)
BUN: 20 mg/dL (ref 6–20)
CO2: 17 mmol/L — ABNORMAL LOW (ref 22–32)
Calcium: 9.3 mg/dL (ref 8.9–10.3)
Chloride: 104 mmol/L (ref 98–111)
Creatinine, Ser: 1.35 mg/dL — ABNORMAL HIGH (ref 0.44–1.00)
GFR, Estimated: 46 mL/min — ABNORMAL LOW (ref >60)
Glucose, Bld: 274 mg/dL — ABNORMAL HIGH (ref 70–99)
Potassium: 4.1 mmol/L (ref 3.5–5.1)
Sodium: 137 mmol/L (ref 135–145)

## 2020-06-09 LAB — CBC
HCT: 31.4 % — ABNORMAL LOW (ref 36.0–46.0)
HCT: 31 % — ABNORMAL LOW (ref 36.0–46.0)
Hemoglobin: 10.1 g/dL — ABNORMAL LOW (ref 12.0–15.0)
Hemoglobin: 10.1 g/dL — ABNORMAL LOW (ref 12.0–15.0)
MCH: 25.8 pg — ABNORMAL LOW (ref 26.0–34.0)
MCH: 25.8 pg — ABNORMAL LOW (ref 26.0–34.0)
MCHC: 32.2 g/dL (ref 30.0–36.0)
MCHC: 32.6 g/dL (ref 30.0–36.0)
MCV: 80.1 fL (ref 80.0–100.0)
MCV: 79.3 fL — ABNORMAL LOW (ref 80.0–100.0)
Platelets: 330 10*3/uL (ref 150–400)
Platelets: 369 10*3/uL (ref 150–400)
RBC: 3.92 MIL/uL (ref 3.87–5.11)
RBC: 3.91 MIL/uL (ref 3.87–5.11)
RDW: 15.8 % — ABNORMAL HIGH (ref 11.5–15.5)
RDW: 15.9 % — ABNORMAL HIGH (ref 11.5–15.5)
WBC: 15.5 10*3/uL — ABNORMAL HIGH (ref 4.0–10.5)
WBC: 16 10*3/uL — ABNORMAL HIGH (ref 4.0–10.5)
nRBC: 0 % (ref 0.0–0.2)
nRBC: 0 % (ref 0.0–0.2)

## 2020-06-09 LAB — URINALYSIS, COMPLETE (UACMP) WITH MICROSCOPIC
Bacteria, UA: NONE SEEN
Bilirubin Urine: NEGATIVE
Glucose, UA: 150 mg/dL — AB
Hgb urine dipstick: NEGATIVE
Ketones, ur: NEGATIVE mg/dL
Leukocytes,Ua: NEGATIVE
Nitrite: NEGATIVE
Protein, ur: 30 mg/dL — AB
Specific Gravity, Urine: 1.023 (ref 1.005–1.030)
pH: 5 (ref 5.0–8.0)

## 2020-06-09 LAB — COMPREHENSIVE METABOLIC PANEL
ALT: 31 U/L (ref 0–44)
AST: 44 U/L — ABNORMAL HIGH (ref 15–41)
Albumin: 2.8 g/dL — ABNORMAL LOW (ref 3.5–5.0)
Alkaline Phosphatase: 211 U/L — ABNORMAL HIGH (ref 38–126)
Anion gap: 15 (ref 5–15)
BUN: 30 mg/dL — ABNORMAL HIGH (ref 6–20)
CO2: 18 mmol/L — ABNORMAL LOW (ref 22–32)
Calcium: 9.2 mg/dL (ref 8.9–10.3)
Chloride: 100 mmol/L (ref 98–111)
Creatinine, Ser: 1.56 mg/dL — ABNORMAL HIGH (ref 0.44–1.00)
GFR, Estimated: 39 mL/min — ABNORMAL LOW (ref >60)
Glucose, Bld: 455 mg/dL — ABNORMAL HIGH (ref 70–99)
Potassium: 4.7 mmol/L (ref 3.5–5.1)
Sodium: 133 mmol/L — ABNORMAL LOW (ref 135–145)
Total Bilirubin: 2.2 mg/dL — ABNORMAL HIGH (ref 0.3–1.2)
Total Protein: 7.6 g/dL (ref 6.5–8.1)

## 2020-06-09 LAB — TROPONIN I (HIGH SENSITIVITY)
Troponin I (High Sensitivity): 6 ng/L (ref <18)
Troponin I (High Sensitivity): 6 ng/L (ref <18)

## 2020-06-09 LAB — MAGNESIUM: Magnesium: 1.8 mg/dL (ref 1.7–2.4)

## 2020-06-09 LAB — URINE DRUG SCREEN, QUALITATIVE (ARMC ONLY)
Amphetamines, Ur Screen: NOT DETECTED
Barbiturates, Ur Screen: NOT DETECTED
Benzodiazepine, Ur Scrn: NOT DETECTED
Cannabinoid 50 Ng, Ur ~~LOC~~: NOT DETECTED
Cocaine Metabolite,Ur ~~LOC~~: NOT DETECTED
MDMA (Ecstasy)Ur Screen: NOT DETECTED
Methadone Scn, Ur: NOT DETECTED
Opiate, Ur Screen: NOT DETECTED
Phencyclidine (PCP) Ur S: NOT DETECTED
Tricyclic, Ur Screen: POSITIVE — AB

## 2020-06-09 LAB — RESP PANEL BY RT-PCR (FLU A&B, COVID) ARPGX2
Influenza A by PCR: NEGATIVE
Influenza B by PCR: NEGATIVE
SARS Coronavirus 2 by RT PCR: NEGATIVE

## 2020-06-09 LAB — BETA-HYDROXYBUTYRIC ACID: Beta-Hydroxybutyric Acid: 0.42 mmol/L — ABNORMAL HIGH (ref 0.05–0.27)

## 2020-06-09 LAB — PHOSPHORUS: Phosphorus: 3.8 mg/dL (ref 2.5–4.6)

## 2020-06-09 LAB — LIPASE, BLOOD: Lipase: 29 U/L (ref 11–51)

## 2020-06-09 LAB — CBG MONITORING, ED: Glucose-Capillary: 398 mg/dL — ABNORMAL HIGH (ref 70–99)

## 2020-06-09 LAB — TSH: TSH: 5.742 u[IU]/mL — ABNORMAL HIGH (ref 0.350–4.500)

## 2020-06-09 LAB — ETHANOL: Alcohol, Ethyl (B): <10 mg/dL (ref <10)

## 2020-06-09 LAB — AMMONIA: Ammonia: 14 umol/L (ref 9–35)

## 2020-06-09 IMAGING — CR DG CHEST 2V
1 series · 2 of 2 positions shown · non-contrast
Comparison: [DATE]

CLINICAL DATA: Shortness of breath

EXAM:
CHEST - 2 VIEW

[Series 1: dg chest 2 view · 0.14mm/px · 2 of 2 slices shown]
[im 1/2]
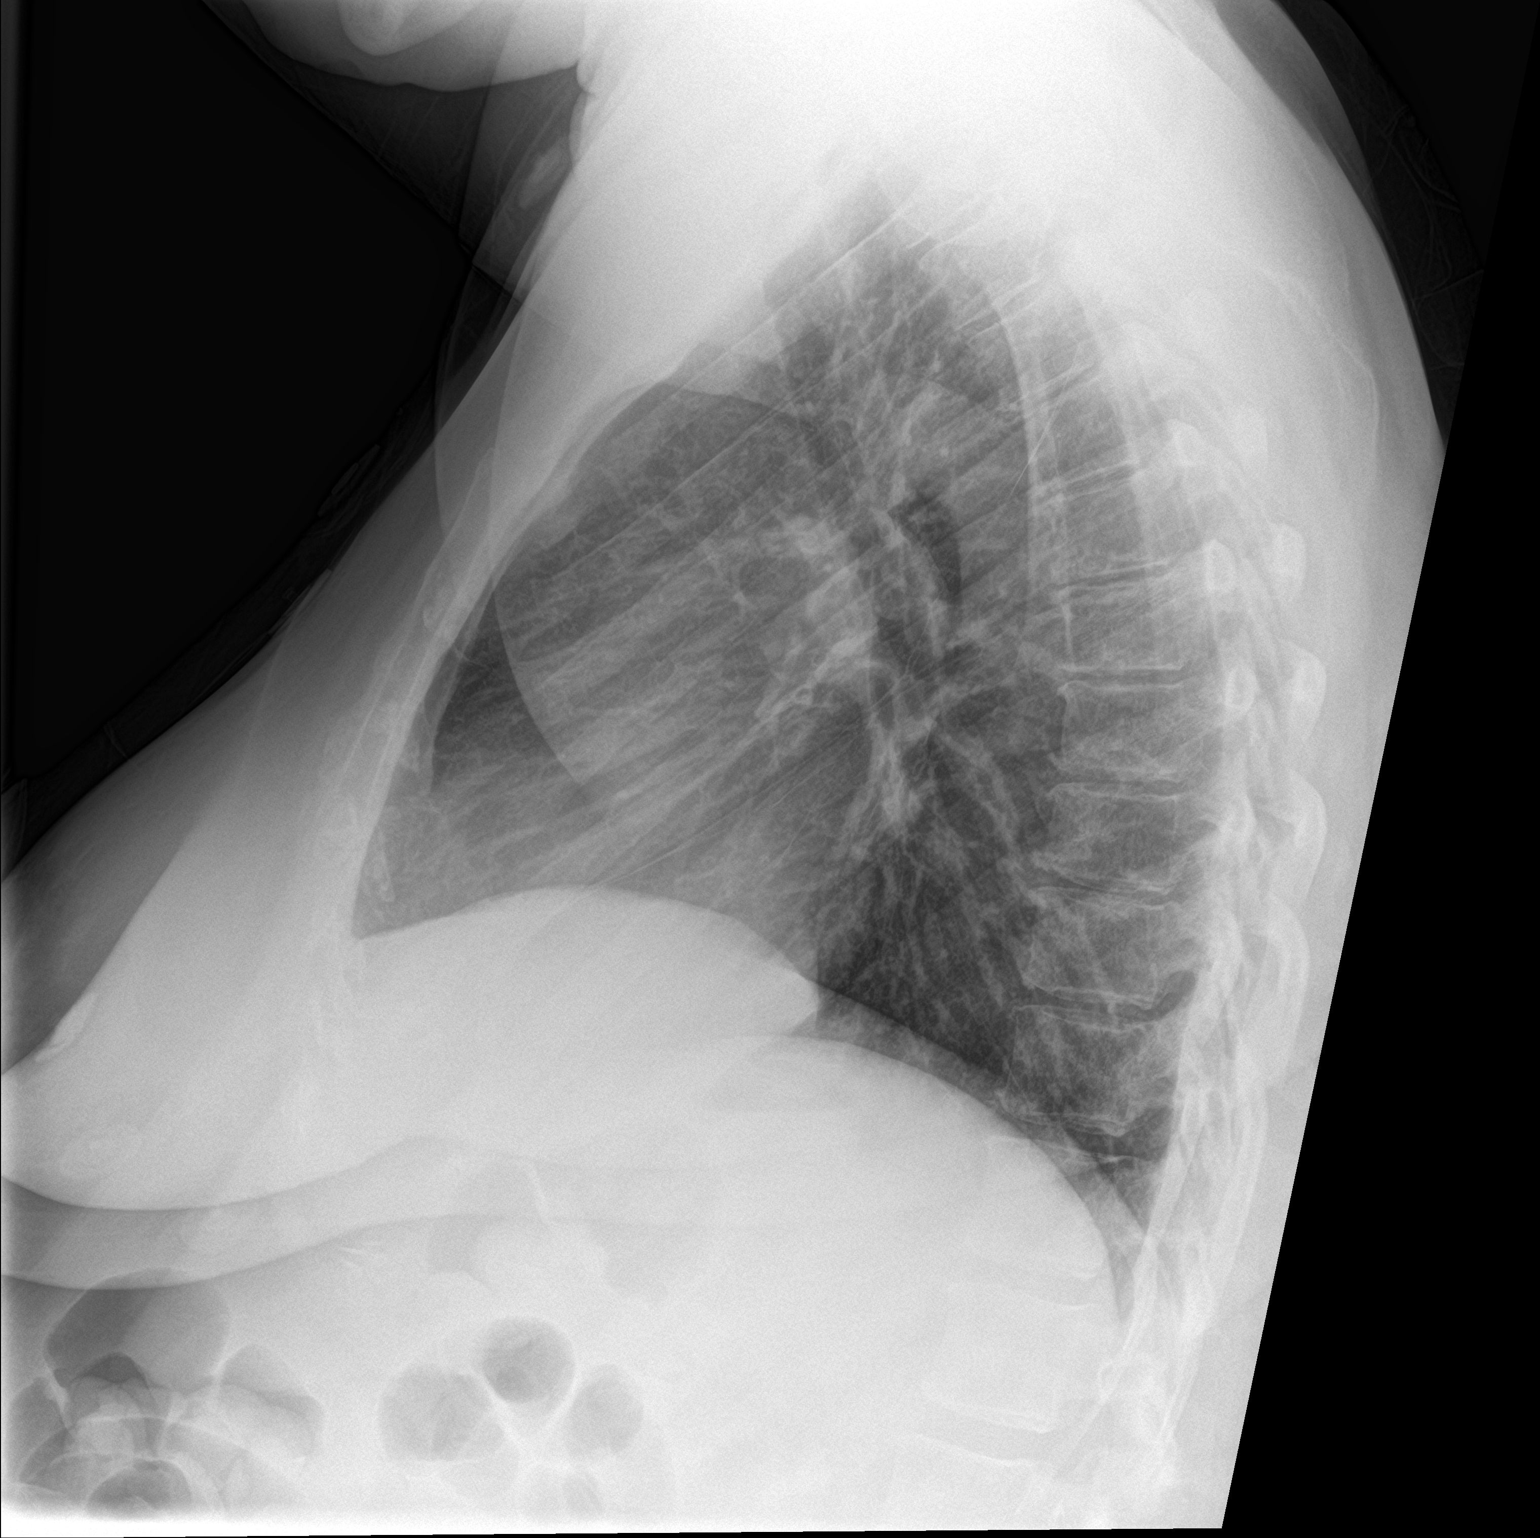
[im 2/2]
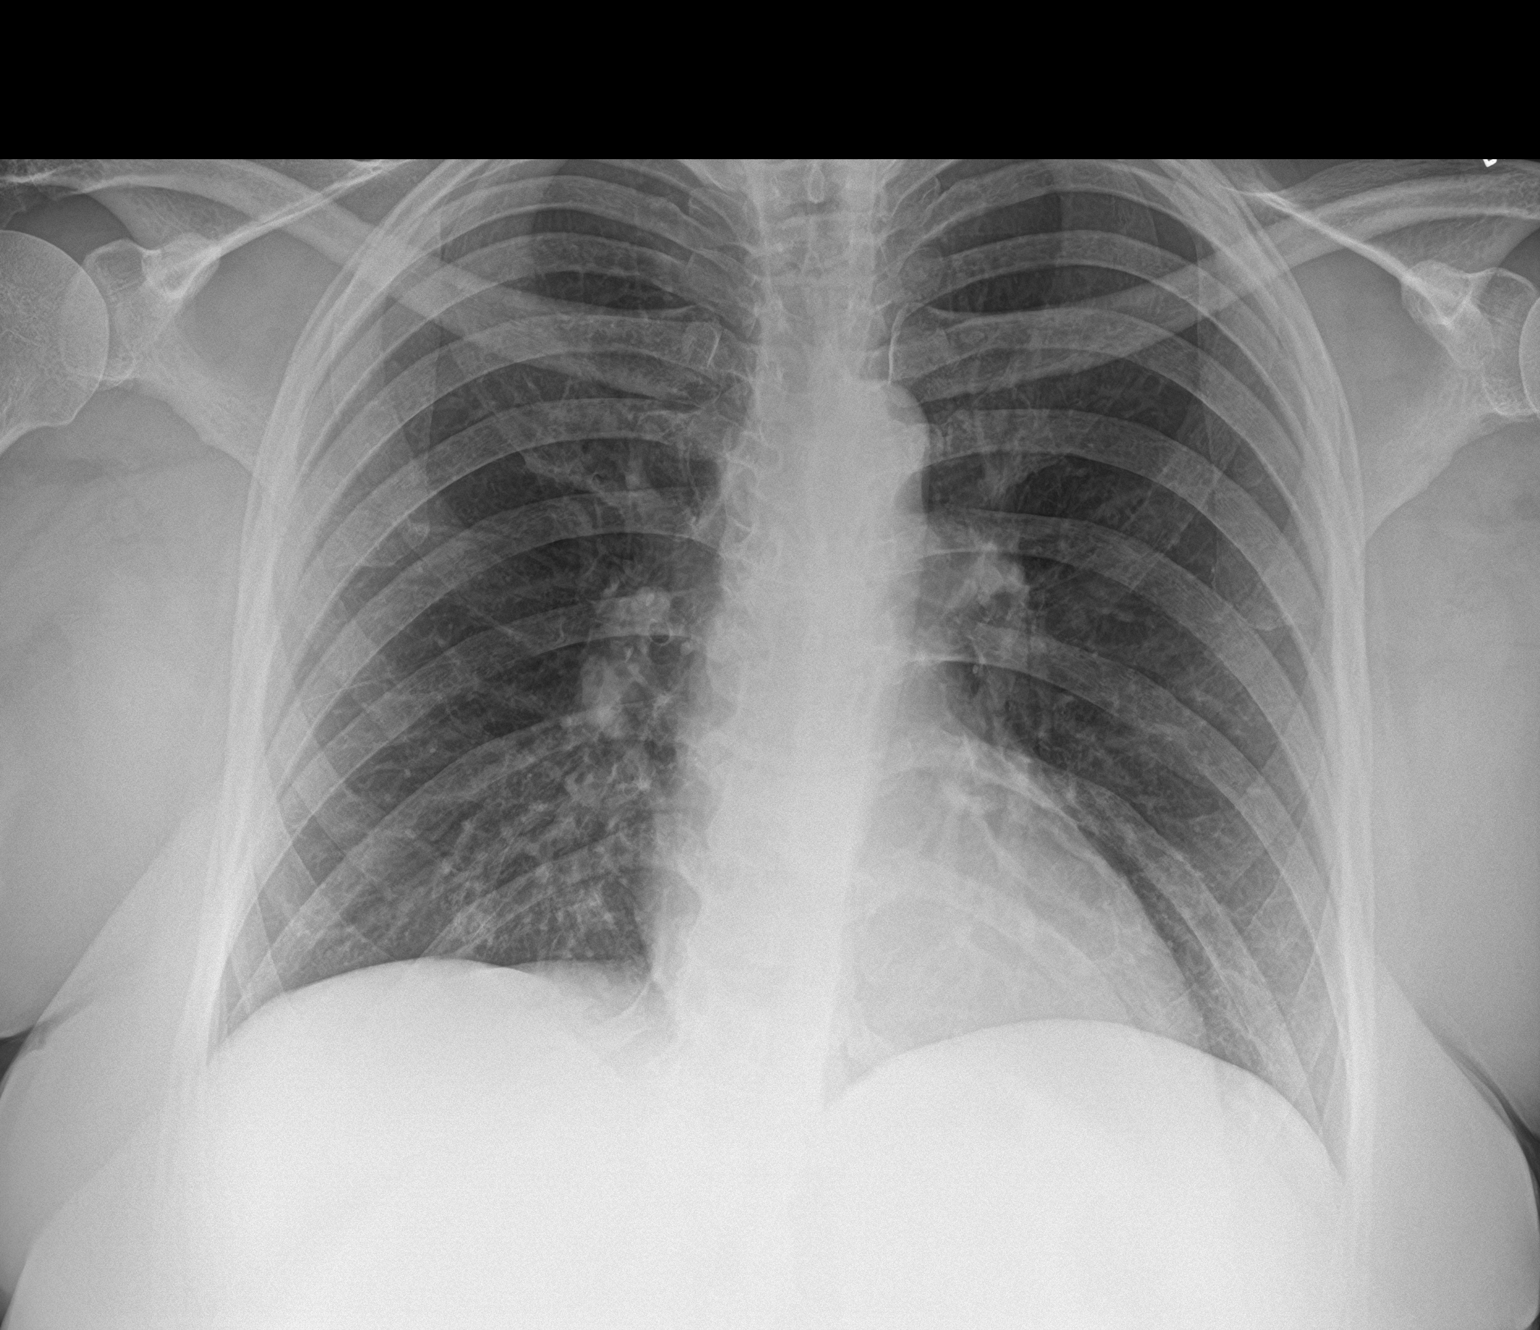

[2 of 2 positions shown; findings below may reference images not displayed]

FINDINGS: Heart and mediastinal contours are within normal limits. No focal
opacities or effusions. No acute bony abnormality.
IMPRESSION: No active cardiopulmonary disease.

## 2020-06-09 IMAGING — CT CT HEAD W/O CM
3 series · 16 of 47 positions shown, 19 images · non-contrast
Comparison: None.

CLINICAL DATA: Delirium

EXAM:
CT HEAD WITHOUT CONTRAST
TECHNIQUE: Contiguous axial images were obtained from the base of the skull
through the vertex without intravenous contrast.

[Series 2: head wo · axial · 0.44mm/px · z∈[-233,-98]mm · 10 of 33 slices shown, 13 images]
[im 3/33  brain]
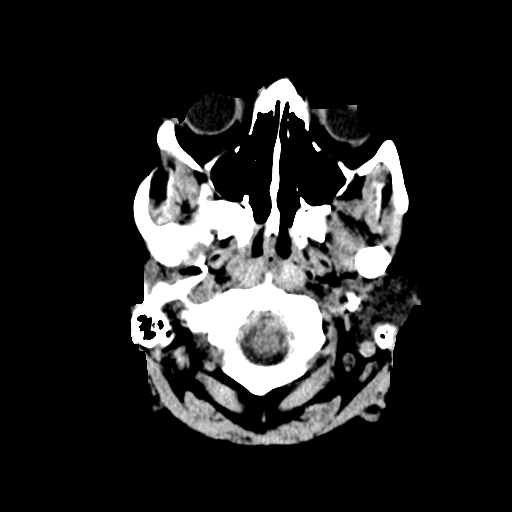
[im 3/33  bone]
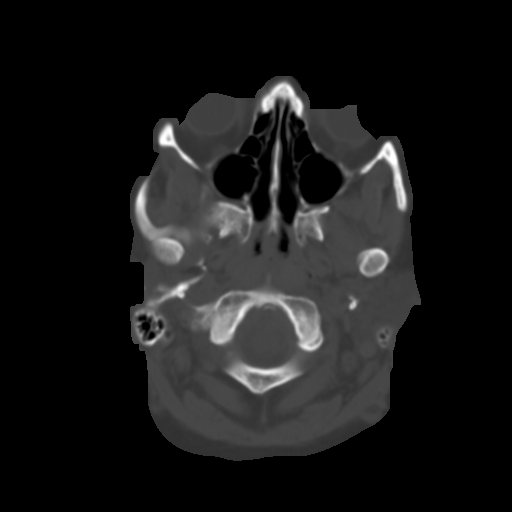
[im 6/33  brain]
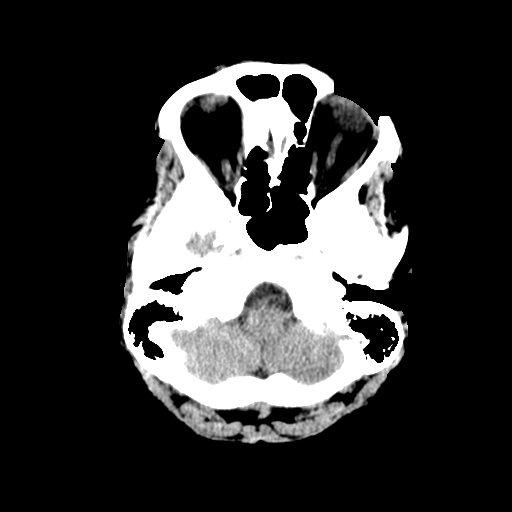
[im 9/33  brain]
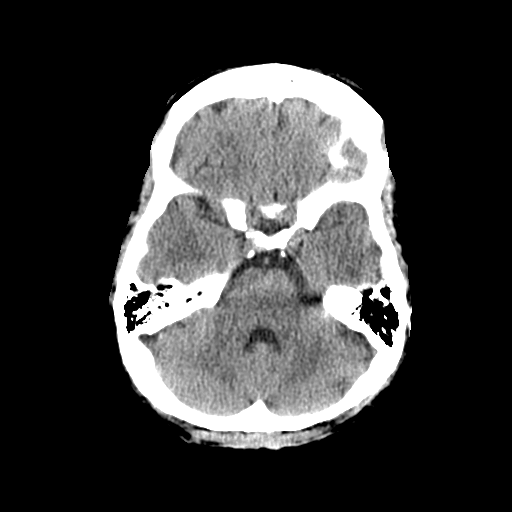
[im 12/33  brain]
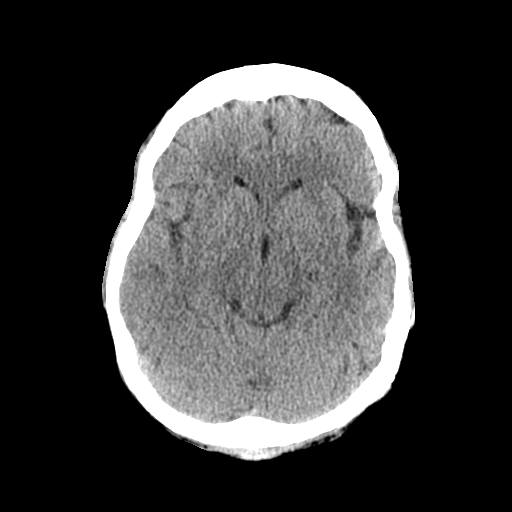
[im 15/33  brain]
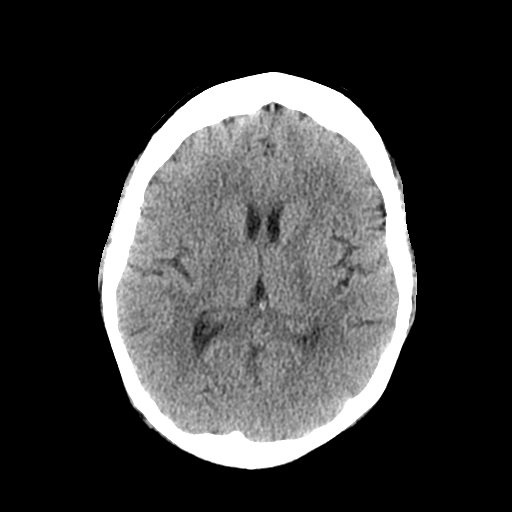
[im 15/33  bone]
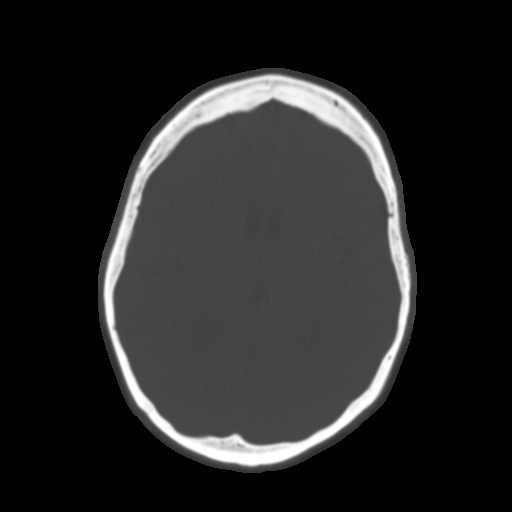
[im 18/33  brain]
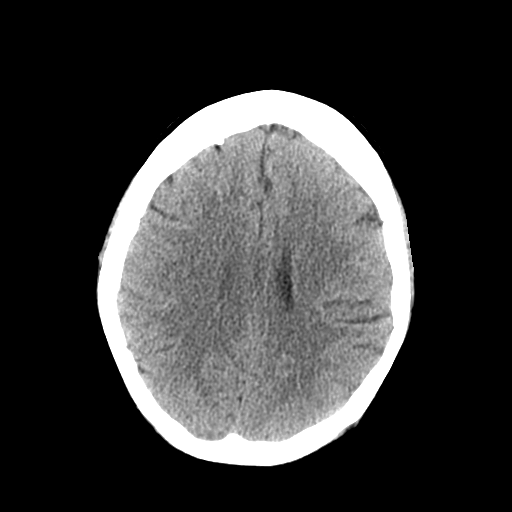
[im 21/33  brain]
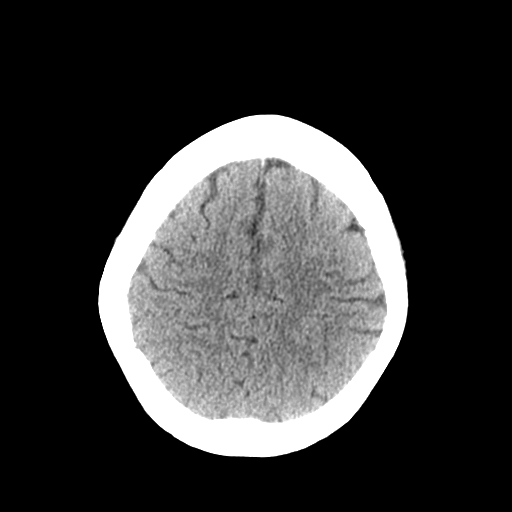
[im 25/33  brain]
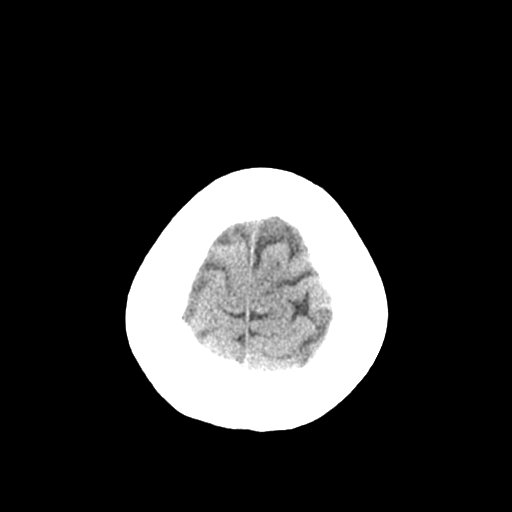
[im 27/33  brain]
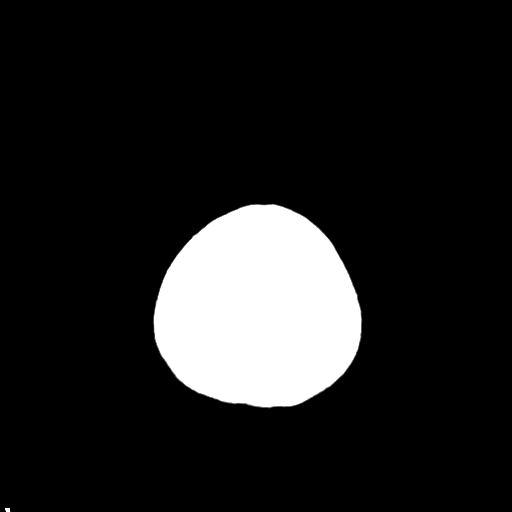
[im 27/33  bone]
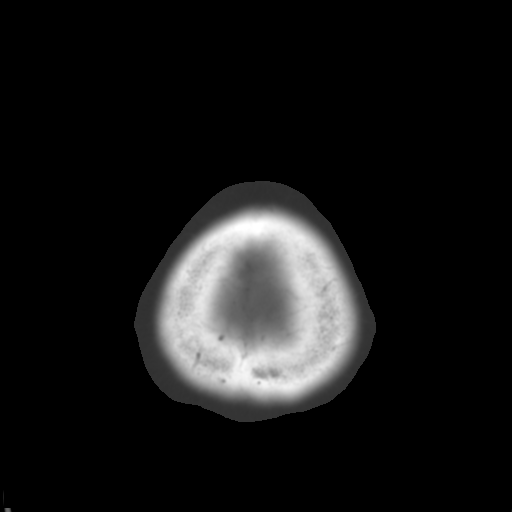
[im 30/33  brain]
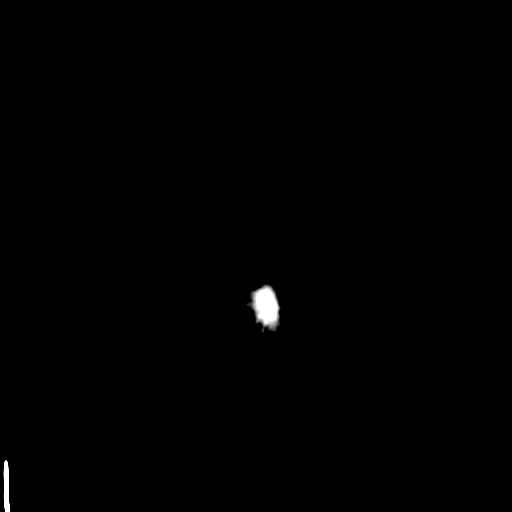

[Series 4: coronal soft tissue · coronal · 0.33mm/px · 3 of 71 slices shown]
[im 24/71  brain]
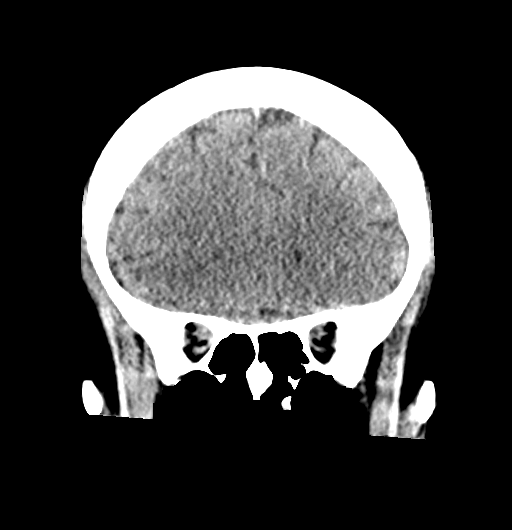
[im 32/71  brain]
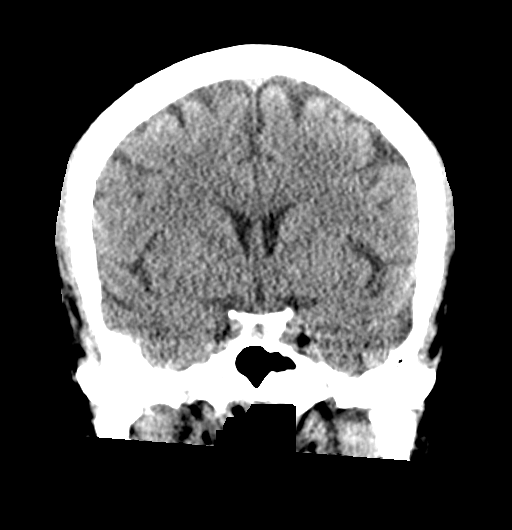
[im 39/71  brain]
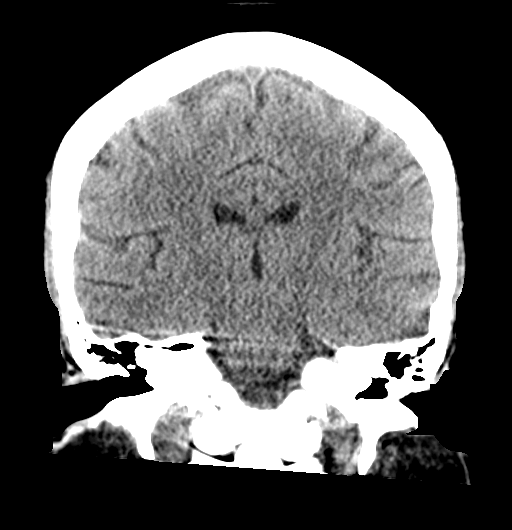

[Series 5: sagittal soft tissue · sagittal · 0.35mm/px · 3 of 55 slices shown]
[im 19/55  brain]
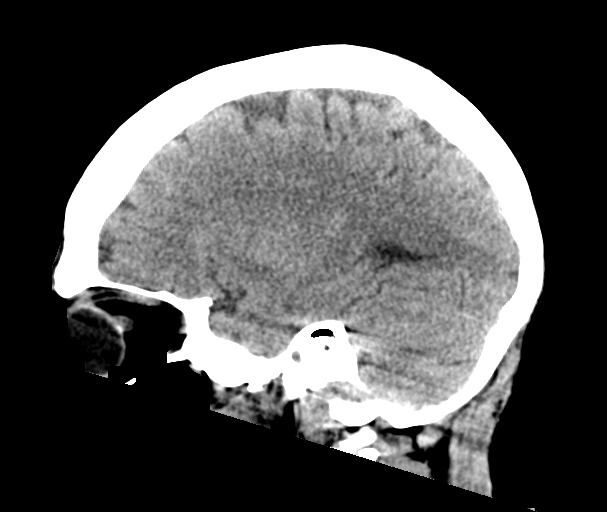
[im 28/55  brain]
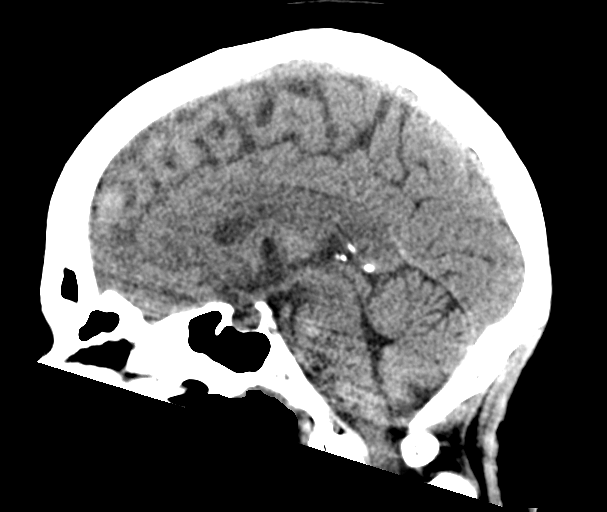
[im 37/55  brain]
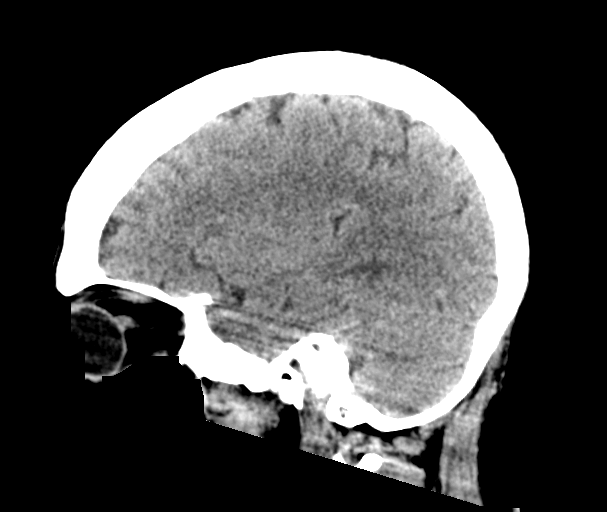

[16 of 47 positions shown; findings below may reference images not displayed]

FINDINGS: Brain:

No evidence of large-territorial acute infarction. No parenchymal
hemorrhage. No mass lesion. No extra-axial collection.

No mass effect or midline shift. No hydrocephalus. Basilar cisterns
are patent.

Vascular: No hyperdense vessel.

Skull: No acute fracture or focal lesion.

Sinuses/Orbits: Paranasal sinuses and mastoid air cells are clear.
The orbits are unremarkable.

Other: None.
IMPRESSION: No acute intracranial abnormality.

## 2020-06-09 IMAGING — DX DG PELVIS 1-2V
1 series · 1 of 1 positions shown · non-contrast
Comparison: None.

CLINICAL DATA: MRI clearance

EXAM:
PELVIS - 1-2 VIEW

[abdomen supine]
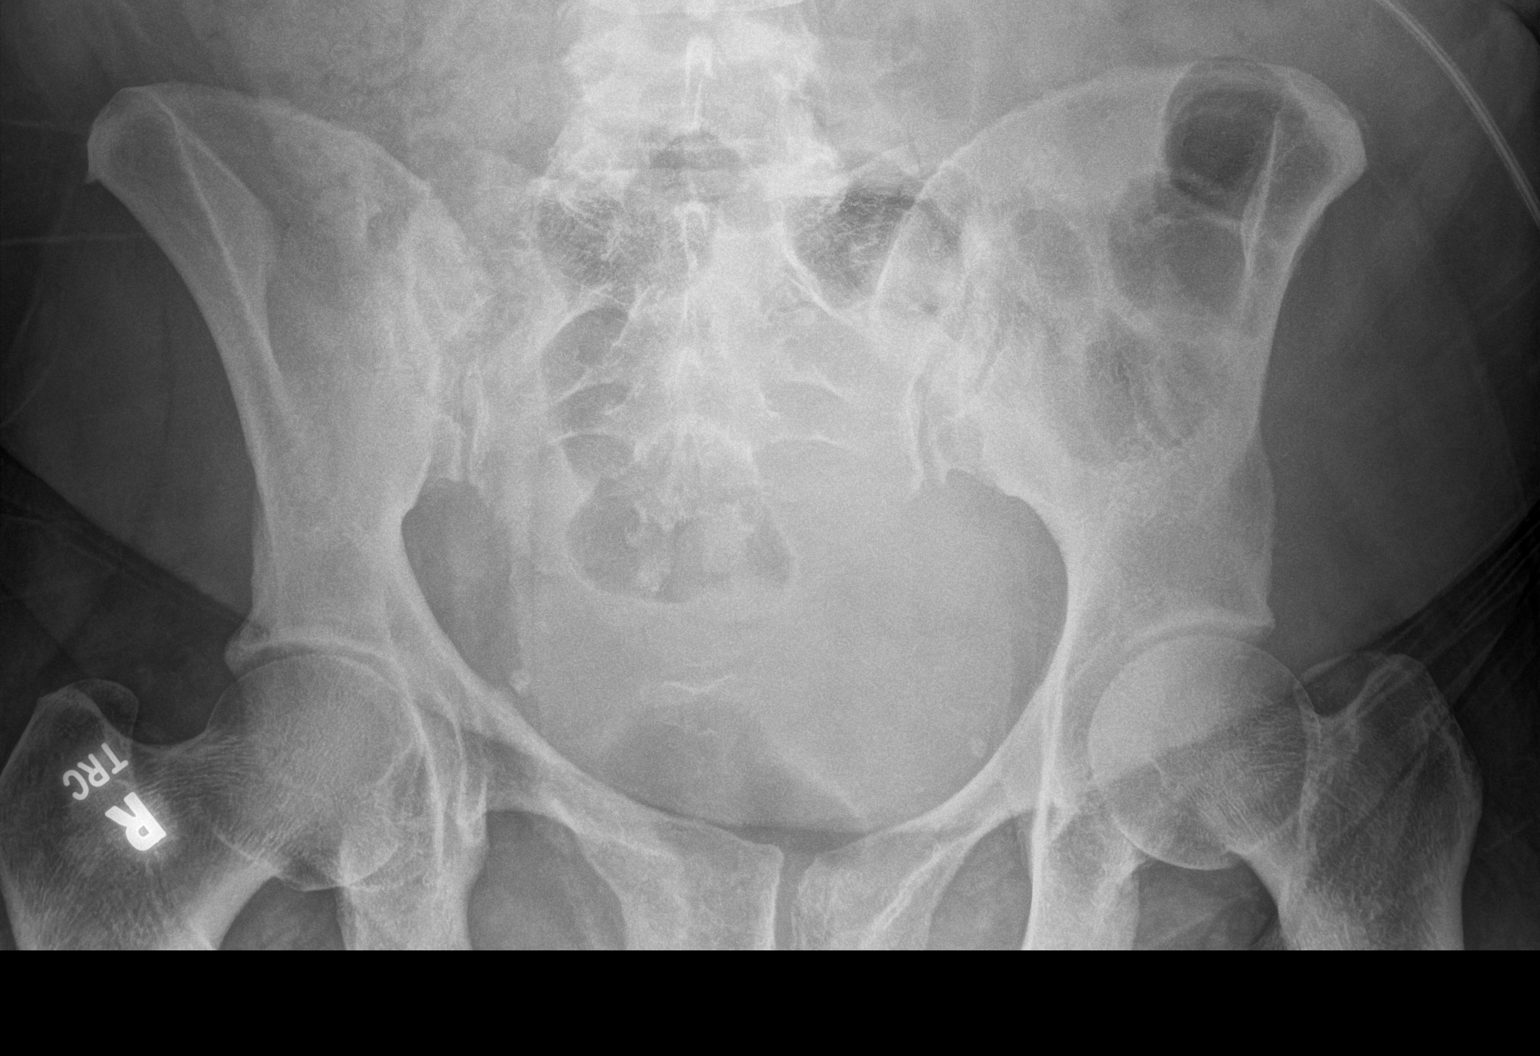

[1 of 1 positions shown; findings below may reference images not displayed]

FINDINGS: There is no evidence of pelvic fracture or diastasis. No pelvic bone
lesions are seen.
IMPRESSION: No metallic foreign body.

## 2020-06-09 IMAGING — DX DG ABDOMEN 1V
1 series · 1 of 1 positions shown · non-contrast
Comparison: None.

CLINICAL DATA: MRI clearance

EXAM:
ABDOMEN - 1 VIEW

[abdomen supine]
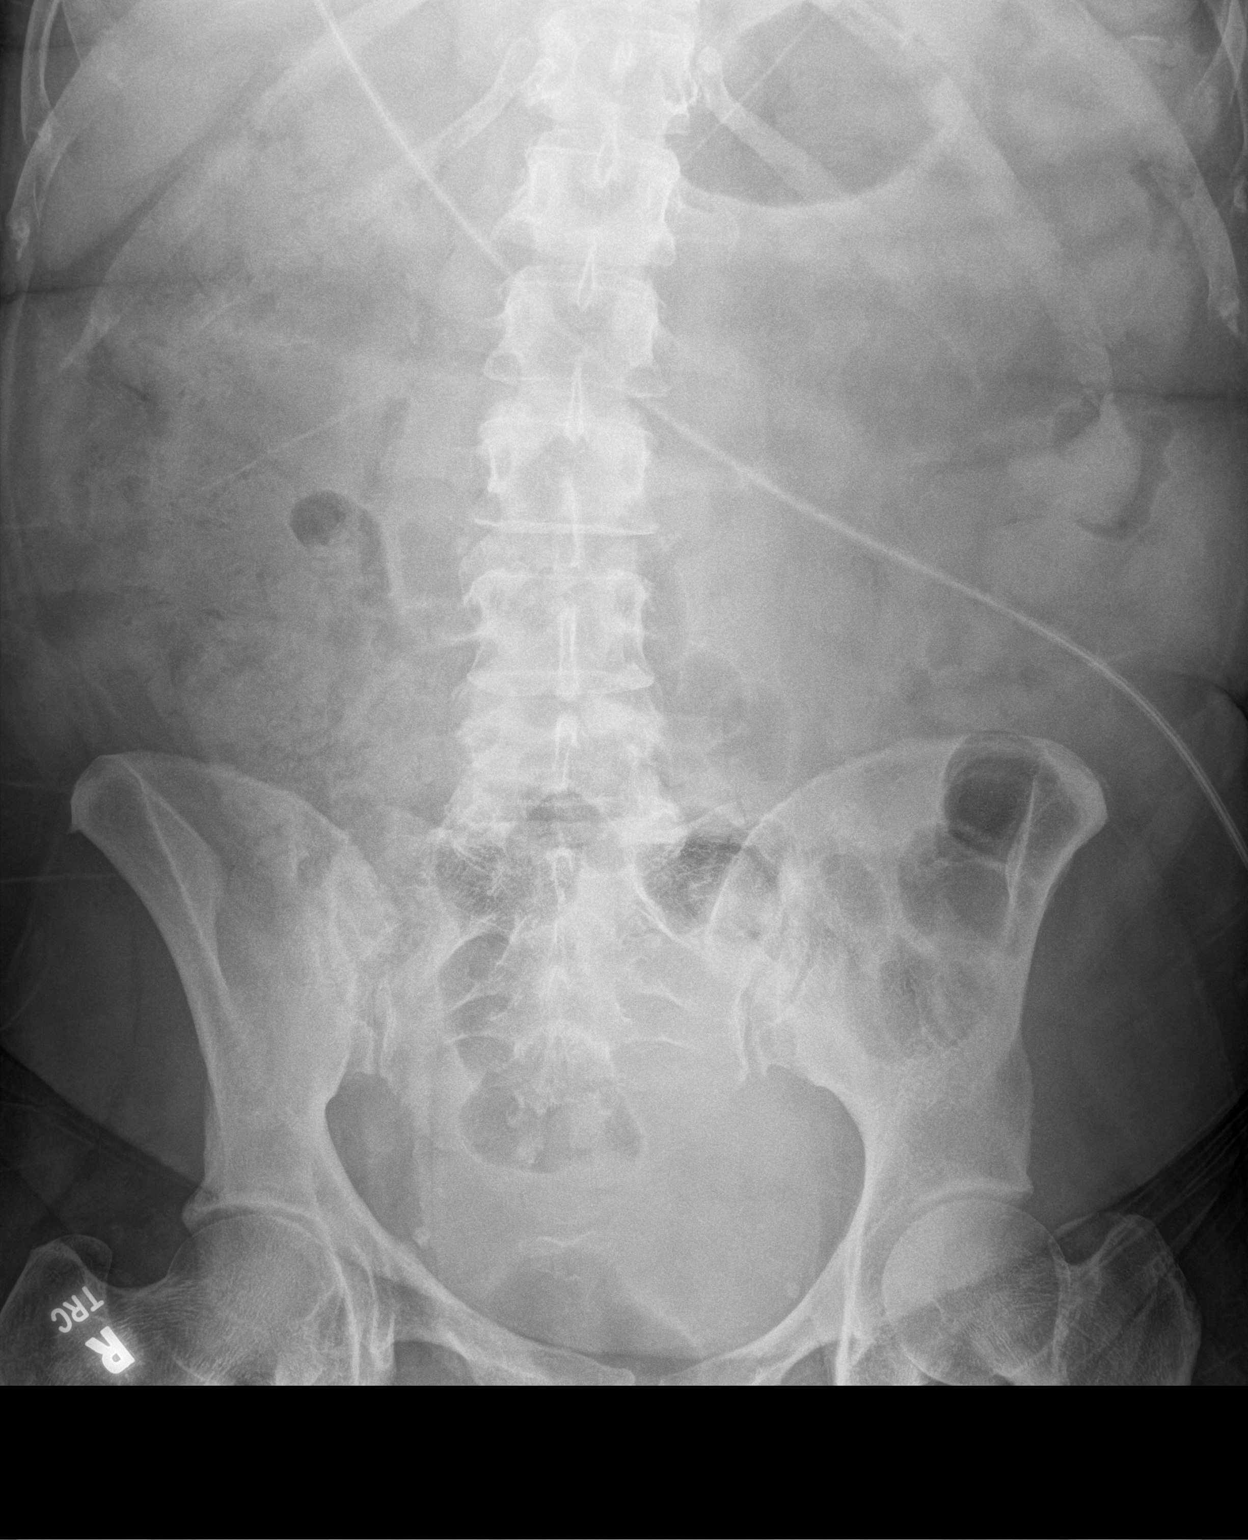

[1 of 1 positions shown; findings below may reference images not displayed]

FINDINGS: The bowel gas pattern is normal. No radio-opaque calculi or other
significant radiographic abnormality are seen.
IMPRESSION: No metallic foreign body.

## 2020-06-09 IMAGING — DX DG ORBITS FOR FOREIGN BODY
2 series · 2 of 2 positions shown · non-contrast
Comparison: None.

CLINICAL DATA: Metal working/exposure; clearance prior to MRI

EXAM:
ORBITS FOR FOREIGN BODY - 2 VIEW

[skull waters (1 of 2)]
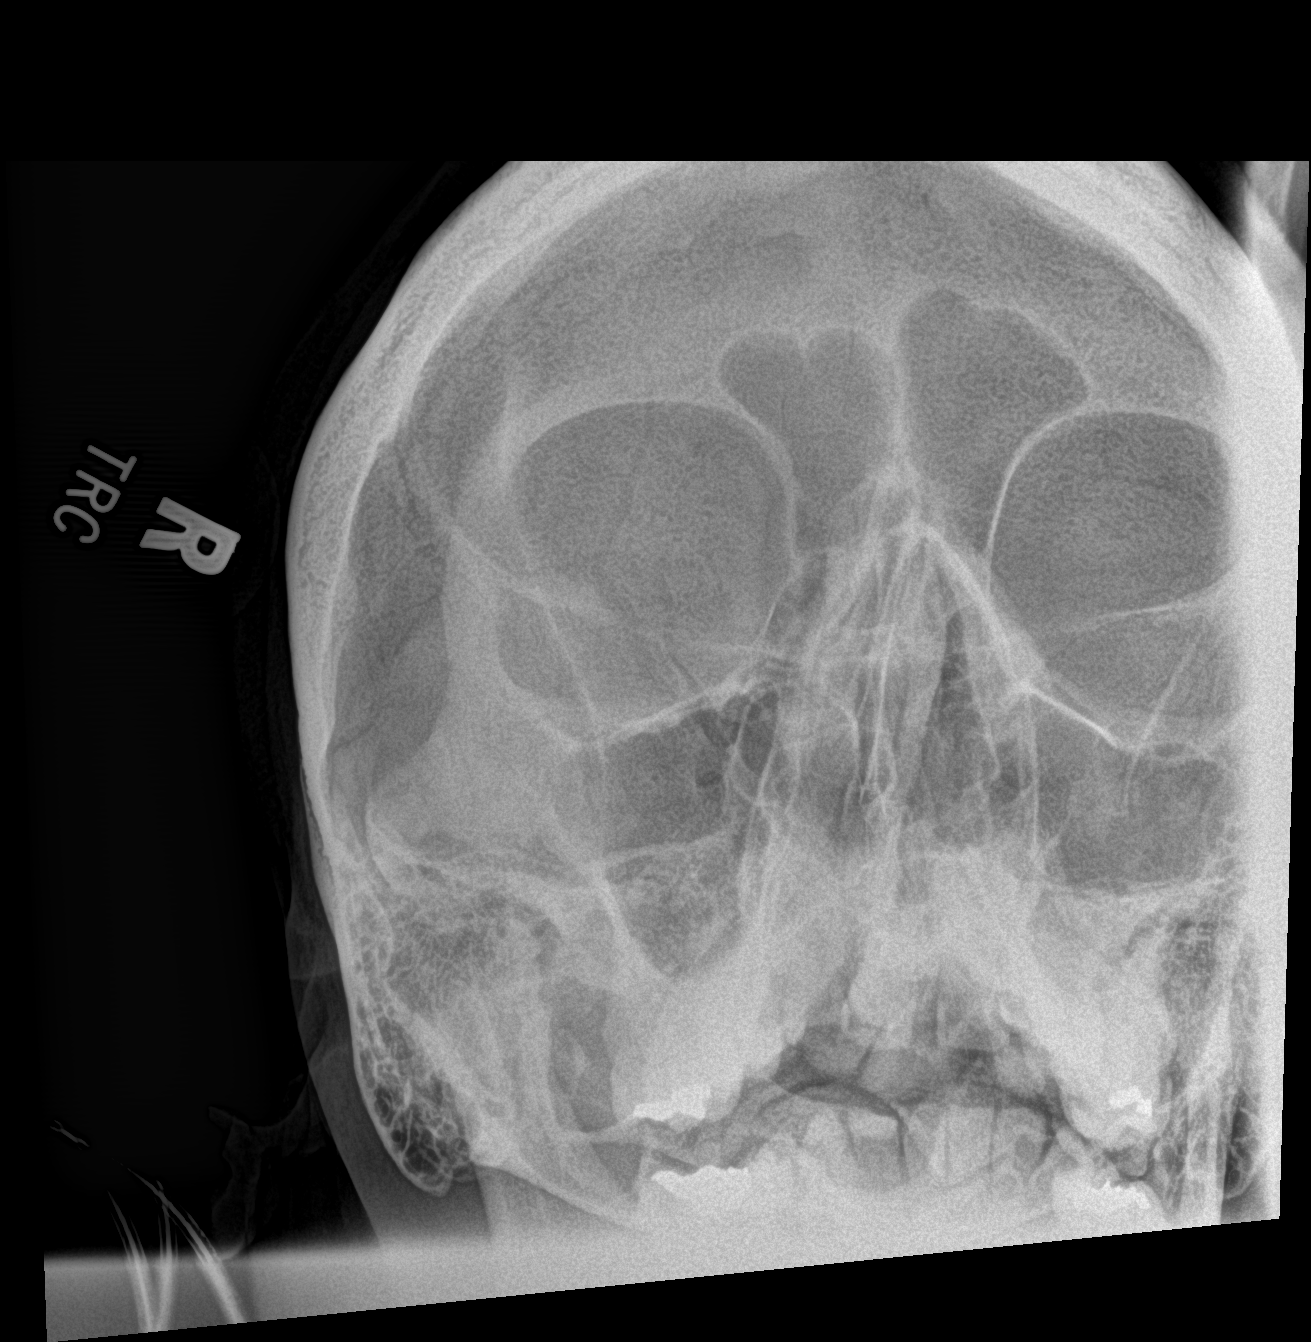

[skull waters (2 of 2)]
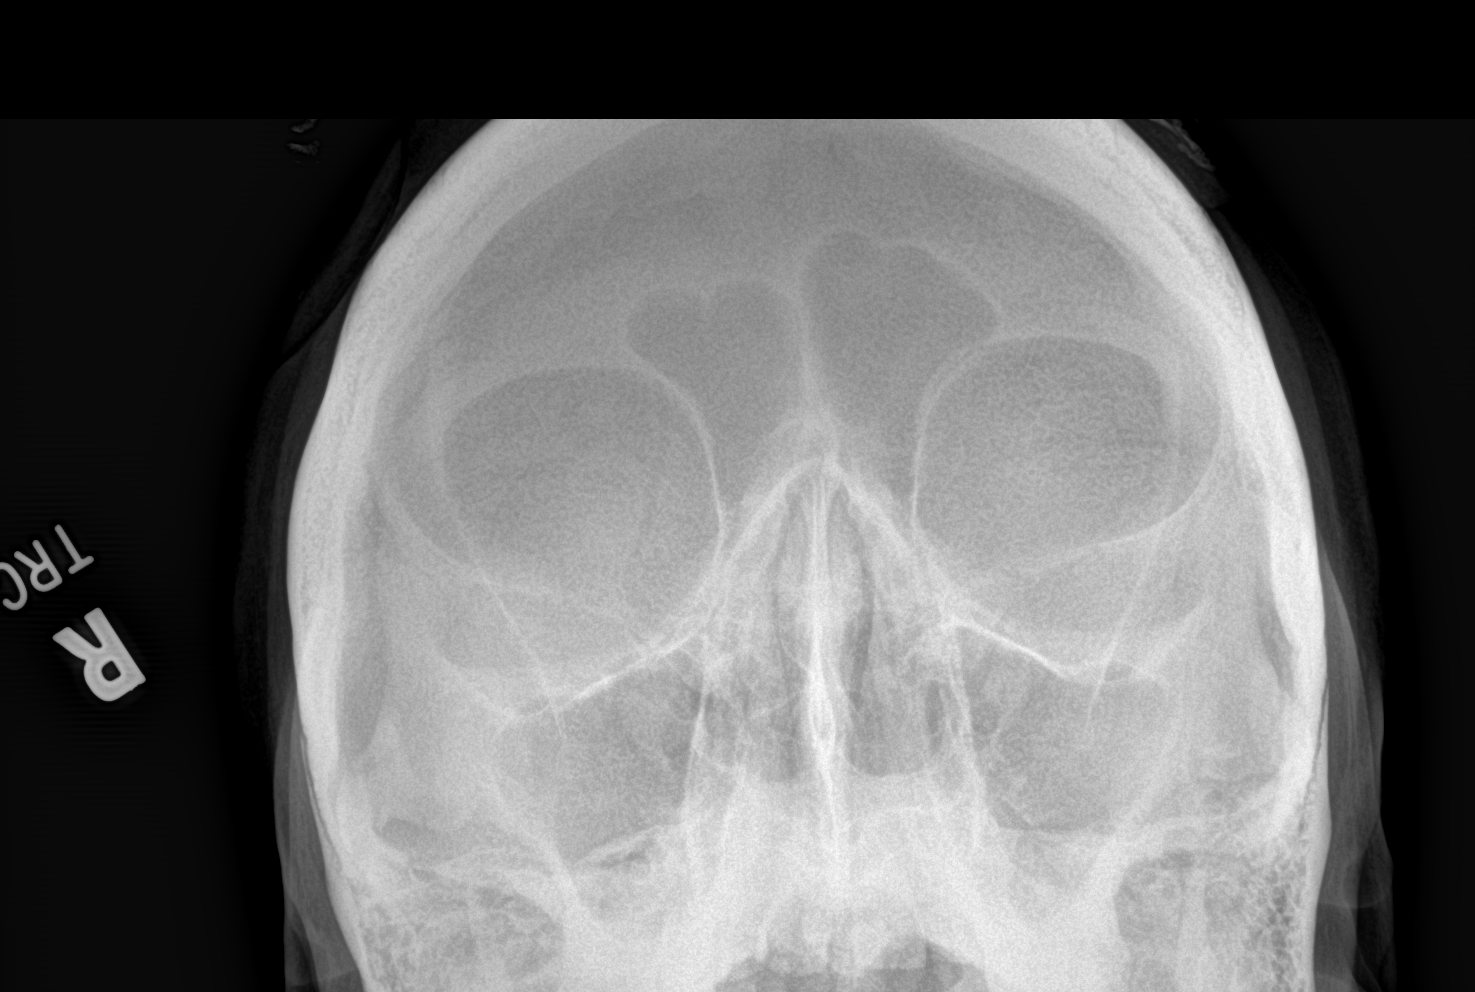

[2 of 2 positions shown; findings below may reference images not displayed]

FINDINGS: There is no evidence of metallic foreign body within the orbits. No
significant bone abnormality identified.
IMPRESSION: No evidence of metallic foreign body within the orbits.

## 2020-06-09 MED ORDER — LEVOTHYROXINE SODIUM 50 MCG PO TABS
150.0000 ug | ORAL_TABLET | Freq: Every day | ORAL | Status: DC
  Administered 2020-06-15 – 2020-06-27 (×12): 150 ug via ORAL
  Filled 2020-06-09 (×13): qty 1

## 2020-06-09 MED ORDER — DEXTROSE IN LACTATED RINGERS 5 % IV SOLN: INTRAVENOUS | Status: DC

## 2020-06-09 MED ORDER — POLYETHYLENE GLYCOL 3350 17 G PO PACK
17.0000 g | PACK | Freq: Every day | ORAL | Status: DC | PRN
  Administered 2020-06-18 – 2020-06-27 (×4): 17 g via ORAL
  Filled 2020-06-09 (×3): qty 1

## 2020-06-09 MED ORDER — ACETAMINOPHEN 650 MG RE SUPP
650.0000 mg | Freq: Four times a day (QID) | RECTAL | Status: DC | PRN
  Administered 2020-06-10 – 2020-06-14 (×4): 650 mg via RECTAL
  Filled 2020-06-09 (×4): qty 1

## 2020-06-09 MED ORDER — DEXTROSE 50 % IV SOLN: 0.0000 mL | INTRAVENOUS | Status: DC | PRN

## 2020-06-09 MED ORDER — ACETAMINOPHEN 325 MG PO TABS
650.0000 mg | ORAL_TABLET | Freq: Four times a day (QID) | ORAL | Status: DC | PRN
  Administered 2020-06-14 – 2020-06-21 (×4): 650 mg via ORAL
  Filled 2020-06-09 (×4): qty 2

## 2020-06-09 MED ORDER — INSULIN REGULAR(HUMAN) IN NACL 100-0.9 UT/100ML-% IV SOLN
INTRAVENOUS | Status: DC
  Administered 2020-06-10: 13 [IU]/h via INTRAVENOUS
  Filled 2020-06-09 (×2): qty 100

## 2020-06-09 MED ORDER — ALBUTEROL SULFATE HFA 108 (90 BASE) MCG/ACT IN AERS
1.0000 | INHALATION_SPRAY | Freq: Four times a day (QID) | RESPIRATORY_TRACT | Status: DC | PRN
  Filled 2020-06-09: qty 6.7

## 2020-06-09 MED ORDER — HALOPERIDOL LACTATE 5 MG/ML IJ SOLN
2.0000 mg | Freq: Once | INTRAMUSCULAR | Status: AC
  Administered 2020-06-09: 2 mg via INTRAVENOUS
  Filled 2020-06-09: qty 1

## 2020-06-09 MED ORDER — LACTATED RINGERS IV SOLN: INTRAVENOUS | Status: DC

## 2020-06-09 MED ORDER — SODIUM CHLORIDE 0.9 % IV BOLUS
1000.0000 mL | Freq: Once | INTRAVENOUS | Status: AC
  Administered 2020-06-09: 1000 mL via INTRAVENOUS

## 2020-06-09 MED ORDER — OXYCODONE-ACETAMINOPHEN 5-325 MG PO TABS
1.0000 | ORAL_TABLET | ORAL | Status: DC | PRN
  Administered 2020-06-09: 1 via ORAL
  Filled 2020-06-09: qty 1

## 2020-06-09 MED ORDER — SODIUM CHLORIDE 0.9% FLUSH
3.0000 mL | Freq: Two times a day (BID) | INTRAVENOUS | Status: DC
  Administered 2020-06-10 – 2020-06-15 (×11): 3 mL via INTRAVENOUS

## 2020-06-09 MED ORDER — HALOPERIDOL LACTATE 5 MG/ML IJ SOLN
2.0000 mg | Freq: Four times a day (QID) | INTRAMUSCULAR | Status: DC | PRN
  Administered 2020-06-10 – 2020-06-14 (×8): 2 mg via INTRAVENOUS
  Filled 2020-06-09 (×10): qty 1

## 2020-06-09 MED ORDER — LORAZEPAM 2 MG/ML IJ SOLN
1.0000 mg | Freq: Once | INTRAMUSCULAR | Status: AC
  Administered 2020-06-09: 1 mg via INTRAVENOUS
  Filled 2020-06-09: qty 1

## 2020-06-09 MED ORDER — LACTATED RINGERS IV BOLUS
1000.0000 mL | Freq: Once | INTRAVENOUS | Status: AC
  Administered 2020-06-10: 1000 mL via INTRAVENOUS

## 2020-06-09 MED ORDER — DROPERIDOL 2.5 MG/ML IJ SOLN
2.5000 mg | Freq: Once | INTRAMUSCULAR | Status: AC
  Administered 2020-06-09: 2.5 mg via INTRAVENOUS
  Filled 2020-06-09: qty 2

## 2020-06-09 MED ORDER — ENOXAPARIN SODIUM 40 MG/0.4ML ~~LOC~~ SOLN: 40.0000 mg | SUBCUTANEOUS | Status: DC

## 2020-06-09 MED ORDER — INSULIN ASPART 100 UNIT/ML ~~LOC~~ SOLN
6.0000 [IU] | Freq: Once | SUBCUTANEOUS | Status: DC
  Administered 2020-06-09: 6 [IU] via INTRAVENOUS
  Filled 2020-06-09: qty 1

## 2020-06-09 NOTE — ED Notes (Signed)
Patient transported to CT 

## 2020-06-09 NOTE — ED Triage Notes (Signed)
Patient triaged earlier at 11:32am and left being being put in room. Reports "I thought I was done." c/o all over back pain and difficulty speaking (mumbled off and on). Also c/o difficulty ambulating X1 month. Patient unable to sit still in wheelchair.

## 2020-06-09 NOTE — H&P (Addendum)
History and Physical   Tonya Myers WJX:[id number] DOB: 21-Oct-1965 DOA: 06/09/2020  PCP: [name], MD   Patient coming from: Home  Chief Complaint: Altered mental status  HPI: Tonya Myers is a 55 y.o. female with medical history significant of hypothyroidism, degenerative disc disease, cervical radiculopathy, CLL, diabetes, hypertension, hyperlipidemia presents with altered no status.  History obtained with assistance of chart review due to patient's altered mental status and likely expressive aphasia.  Patient had evidently presented earlier in the day and left without being seen after having labs done.  It was later discovered based on report from her significant other that she had thought she had seen a doctor when she had not.  When she returned to the ED she was unable to fully express while she was there and she was able to be understood at times however at other times she seemed nonsensical and had issues finding the right words to say.  On review of symptoms she did report that she has been feeling weak recently.  Has had some pain potentially in her neck recently per EDP based on his conversation with her significant other.   There was no one present with her for me to get collateral.  She does not have a contact number in her contact sheet.  She continues to have expressive aphasia.  She does seem to understand what I am saying to her and will at times nod in the affirmative.  She did follow commands as well.  ED Course: Vital signs in ED significant for respiratory rate in the teens to 20s, heart rate in the 100s to 110s.  Lab work-up showed BMP with creatinine of 1.3 and 1.5 on repeat from a baseline of 0.9.  Glucose initially in the 200s and then up to the 400s on repeat.  Bicarb 17, gap 16.  LFTs with protein 2.8, AST 50 which is stable, ALP 200, T bili 2.1.  CBC showed leukocytosis to 15, hemoglobin 10.1 which is stable from 10.9 recently.  Troponin negative x2.  Lipase  negative.  Respiratory panel for flu and Covid negative.  Ethanol negative.  Ammonia negative.  UDS and UA pending.  Initially on first evaluation but hydroxybutyric acid was elevated.  Chest x-ray was without acute abnormality, CT head was without acute abnormality.  MR brain ordered and is pending.  Patient given insulin and fluids in ED.  Review of Systems: Unable to be reliably performed due to altered mental status and aphasia.  Past Medical History:  Diagnosis Date  . Acute hemorrhoid 03/11/2015  . Anxiety   . Arthritis   . Asthma   . Cancer ([place])   . Chronic back pain   . CLL (chronic lymphocytic leukemia) ([place])   . Depression   . Diabetes mellitus without complication ([place])   . Genital herpes    type 2  . Hypertension   . Hypothyroidism   . Vertigo     Past Surgical History:  Procedure Laterality Date  . ABDOMINAL HYSTERECTOMY    . BILATERAL SALPINGOOPHORECTOMY  2009  . BREAST BIOPSY Right 05/17/2016   FIBROADENOMATOUS CHANGE AND SCLEROSING ADENOSIS WITH  . COLONOSCOPY WITH PROPOFOL N/A 06/16/2015   Procedure: COLONOSCOPY WITH PROPOFOL;  Surgeon: [name], MD;  Location: [facility] ENDOSCOPY;  Service: Endoscopy;  Laterality: N/A;  . LAPAROSCOPIC SUPRACERVICAL HYSTERECTOMY  2009   due to [place]  . OOPHORECTOMY      Social History  reports that she has never smoked. She has never used  smokeless tobacco. She reports current alcohol use of about 1.0 standard drink of alcohol per week. She reports that she does not use drugs.  No Known Allergies  Family History  Problem Relation Age of Onset  . Cancer Paternal Aunt   . Breast cancer Maternal Aunt 70  Reviewed on admission  Prior to Admission medications   Medication Sig Start Date End Date Taking? Authorizing Provider  albuterol (VENTOLIN HFA) 108 (90 Base) MCG/ACT inhaler Inhale 1 puff into the lungs every 6 (six) hours as needed for wheezing or shortness of breath. Patient taking differently: Inhale 1 puff  into the lungs in the morning and at bedtime.  03/15/19 03/14/20  [name], DO  Blood Glucose Monitoring Suppl (FIFTY50 GLUCOSE METER 2.0) w/Device KIT  02/25/15   [provider]  diltiazem (DILACOR XR) 120 MG 24 hr capsule Take 120 mg by mouth daily.  12/26/14   [provider]  estradiol (ESTRACE) 0.5 MG tablet  03/14/18   [provider]  ezetimibe (ZETIA) 10 MG tablet Take 10 mg by mouth daily.     [provider]  fluticasone (FLONASE) 50 MCG/ACT nasal spray Place 1 spray into both nostrils in the morning and at bedtime.    [provider]  furosemide (LASIX) 20 MG tablet Take 10 mg by mouth daily. Reported on 07/29/2015    [provider]  gabapentin (NEURONTIN) 400 MG capsule 400 mg. 08/28/18   [provider]  Insulin Glargine-Lixisenatide 100-33 UNT-MCG/ML SOPN Inject 24 Units into the skin daily before breakfast. 01/07/20   [provider]  insulin lispro (HUMALOG) 100 UNIT/ML injection Inject into the skin 3 (three) times daily before meals. Sliding scale average of 10 units    [provider]  Lancets Misc. (UNISTIK 2 NORMAL) MISC 1 each. 02/25/15   [provider]  levothyroxine (SYNTHROID, LEVOTHROID) 150 MCG tablet Take 150 mcg by mouth daily before breakfast.     [provider]  montelukast (SINGULAIR) 10 MG tablet Take 10 mg by mouth at bedtime.    [provider]  Jonetta Speak LANCETS 51W MISC USE THREE TIMES A DAY. USE AS INSTRUCTED 04/06/16   [provider]  predniSONE (STERAPRED UNI-PAK 21 TAB) 10 MG (21) TBPK tablet Per packaging instructions 05/25/20   Triplett, Cari B, FNP  telmisartan (MICARDIS) 80 MG tablet Take 80 mg by mouth daily.    [provider]  valACYclovir (VALTREX) 1000 MG tablet Take 1 tablet (1,000 mg total) by mouth 2 (two) times daily. 07/20/18   [name], MD  budesonide-formoterol (SYMBICORT) 160-4.5 MCG/ACT inhaler  Inhale into the lungs. 07/14/18 03/15/19  [provider]    Physical Exam: Vitals:   06/09/20 2100 06/09/20 2230 06/09/20 2245 06/09/20 2345  BP:      Pulse: (!) 110 (!) 104 99 93  Resp: (!) 28 (!) 21 (!) 30 (!) 23  Temp:      TempSrc:      SpO2: 100% 99% (!) 88% 100%  Weight:      Height:       Physical Exam Constitutional:      General: She is in acute distress.     Comments: Confused aphasic female who is alert and in mild distress  HENT:     Head: Normocephalic and atraumatic.     Mouth/Throat:     Mouth: Mucous membranes are moist.     Pharynx: Oropharynx is clear.  Eyes:     Extraocular  Movements: Extraocular movements intact.     Pupils: Pupils are equal, round, and reactive to light.  Cardiovascular:     Rate and Rhythm: Regular rhythm. Tachycardia present.     Pulses: Normal pulses.     Heart sounds: Normal heart sounds.  Pulmonary:     Effort: Pulmonary effort is normal. No respiratory distress.     Breath sounds: Normal breath sounds.  Abdominal:     General: Bowel sounds are normal. There is no distension.     Palpations: Abdomen is soft.     Tenderness: There is no abdominal tenderness.  Musculoskeletal:        General: No swelling or deformity.     Right lower leg: No edema.     Left lower leg: No edema.  Skin:    General: Skin is warm and dry.  Neurological:     General: No focal deficit present.     Mental Status: Mental status is at baseline.     Comments: Patient with expressive aphasia.  Does seem to understand me at times.  And will nod in the affirmative at times.  She does follow commands moving each extremity individually when asked.  Strength and sensation grossly intact.    Labs on Admission: I have personally reviewed following labs and imaging studies  CBC: Recent Labs  Lab 06/09/20 1157 06/09/20 2037  WBC 15.5* 16.0*  HGB 10.1* 10.1*  HCT 31.4* 31.0*  MCV 80.1 79.3*  PLT 330 544    Basic Metabolic Panel: Recent Labs   Lab 06/09/20 1157 06/09/20 2037  NA 137 133*  K 4.1 4.7  CL 104 100  CO2 17* 18*  GLUCOSE 274* 455*  BUN 20 30*  CREATININE 1.35* 1.56*  CALCIUM 9.3 9.2  MG 1.8  --   PHOS 3.8  --     GFR: Estimated Creatinine Clearance: 56.1 mL/min (A) (by C-G formula based on SCr of 1.56 mg/dL (H)).  Liver Function Tests: Recent Labs  Lab 06/09/20 1157 06/09/20 2037  AST 50* 44*  ALT 36 31  ALKPHOS 200* 211*  BILITOT 2.1* 2.2*  PROT 7.3 7.6  ALBUMIN 2.8* 2.8*    Urine analysis:    Component Value Date/Time   COLORURINE AMBER (A) 06/09/2020 2145   APPEARANCEUR CLOUDY (A) 06/09/2020 2145   LABSPEC 1.023 06/09/2020 2145   PHURINE 5.0 06/09/2020 2145   GLUCOSEU 150 (A) 06/09/2020 2145   HGBUR NEGATIVE 06/09/2020 2145   [place] NEGATIVE 06/09/2020 2145   [place] NEGATIVE 06/09/2020 2145   PROTEINUR 30 (A) 06/09/2020 2145   NITRITE NEGATIVE 06/09/2020 2145   LEUKOCYTESUR NEGATIVE 06/09/2020 2145    Radiological Exams on Admission: DG Chest 2 View  Result Date: 06/09/2020 CLINICAL DATA:  Shortness of breath EXAM: CHEST - 2 VIEW COMPARISON:  05/23/2020 FINDINGS: Heart and mediastinal contours are within normal limits. No focal opacities or effusions. No acute bony abnormality. IMPRESSION: No active cardiopulmonary disease. Electronically Signed   By: Rolm Baptise M.D.   On: 06/09/2020 12:25   CT Head Wo Contrast  Result Date: 06/09/2020 CLINICAL DATA:  [place] EXAM: CT HEAD WITHOUT CONTRAST TECHNIQUE: Contiguous axial images were obtained from the base of the skull through the vertex without intravenous contrast. COMPARISON:  None. FINDINGS: Brain: No evidence of large-territorial acute infarction. No parenchymal hemorrhage. No mass lesion. No extra-axial collection. No mass effect or midline shift. No hydrocephalus. Basilar cisterns are patent. Vascular: No hyperdense vessel. Skull: No acute fracture or focal lesion. Sinuses/Orbits: Paranasal sinuses  and mastoid air cells are  clear. The orbits are unremarkable. Other: None. IMPRESSION: No acute intracranial abnormality. Electronically Signed   By: Iven Finn M.D.   On: 06/09/2020 22:19   EKG: Independently reviewed.  Sinus tachycardia at 108 bpm.  Baseline artifact.  QTc 452.  Assessment/Plan Principal Problem:   Acute encephalopathy Active Problems:   CLL (chronic lymphocytic leukemia) ([facility])   Acquired hypothyroidism   Essential hypertension   Uncontrolled type 2 diabetes mellitus with hyperglycemia, without long-term current use of insulin ([facility])   Aphasia   Hyperglycemic crisis in diabetes mellitus ([place])  Acute encephalopathy > Patient presents with acute encephalopathy and has had some worsening symptoms last couple days per reports, though I have not been able to confirm this. > In the ED etiology remains unclear.  Does have leukocytosis but did also have a recent steroid taper.  Unclear if infectious etiology is contributing.  Chest x-ray is negative, urinalysis is pending, no apparent nuchal rigidity on my exam.  No fever. > I am suspicious for stroke given her apparent expressive aphasia.  She does seem to be able to understand me and is able to follow commands including all 4 extremities individually when asked. MRI is pending.  This may be difficult to obtain with her intermittent shaking/agitation.  CT head was without acute abnormality. > Possibility of steroid-induced psychosis remains however this is less likely given her steroids were given 12 days ago. > We will also check TSH given her history of hypothyroidism on Synthroid. > Has required Ativan and Haldol for agitation in the ED. Also requiring mittens due to pulling at lines. --- Will benefit from neurology consult in the morning regardless of MRI results - Monitor in progressive unit - Obtain MRI brain if able - Follow-up TSH and VBG - Follow-up urinalysis and UDS - Holding off on antibiotics for now, if patient spikes a fever will need  to start antibiotics and consider antivirals.  During history of HSV and this new onset multiple status.  Especially if we do not have MRI back. - Neurochecks - Allow for permissive hypertension to 488 systolic in the event that she is having a stroke - EEG  Diabetes Hyperglycemic crisis > Initially seen this morning she did have an anion gap and mild beta hydroxybutyric acid elevation. > On repeat her glucose is gone up but her gap has closed. > We will treat with insulin drip for hyperglycemic crisis and plan to transition to subcu insulin when glucose is consistently less than 250. > Home insulin dose per chart is 24 units daily and up to 10 units 3 times daily - Plan to transition to 15 units long-acting and then let drip run for additional hour - Start on insulin drip - LR at 125 mL/hr until CBG less than 250 - Switch to D5-LR when 1 CBG less than 250 - Nothing by mouth  - CBG Q1H or per Endo tool protocol  AKI > Creatinine elevated to 1.5 from a baseline of 0.9 in the setting of likely borderline DKA as above. > Received 1 L bolus in ED - Giving additional liter bolus and continuing on rate per hyperglycemia crisis protocol - Avoid nephrotoxic agents - Monitor renal function electrolytes  Hypothyroidism - Checking TSH as above - Continue home Synthroid   Hypertension - Permissive hypertension as above  History of CLL  DVT prophylaxis: SCDs for now, holding off as we await MRI for possible stroke depending on size of stroke may  not want to give anticoagulants. Code Status:   Will  Family Communication:  None on admission.  Known was present with her when I evaluated the patient and she does not have any contact numbers in her chart other than her cell phone. Disposition Plan:   Patient is from:  Home  Anticipated DC to:  Pending clinical course  Anticipated DC date:  Pending clinical course  Anticipated DC barriers: None  Consults called:  None, will benefit from  neurology consult in the morning. Admission status:  Inpatient, progressive  Severity of Illness: The appropriate patient status for this patient is INPATIENT. Inpatient status is judged to be reasonable and necessary in order to provide the required intensity of service to ensure the patient's safety. The patient's presenting symptoms, physical exam findings, and initial radiographic and laboratory data in the context of their chronic comorbidities is felt to place them at high risk for further clinical deterioration. Furthermore, it is not anticipated that the patient will be medically stable for discharge from the hospital within 2 midnights of admission. The following factors support the patient status of inpatient.   " The patient's presenting symptoms include altered mental status, neck pain. " The worrisome physical exam findings include altered mental status, confusion aphasia, tachycardia. " The initial radiographic and laboratory data are worrisome because of creatinine 1.5 from baseline 0.9, glucose 400s, hemoglobin 10.1, leukocytosis to. " The chronic co-morbidities include diabetes, hypertension, hyperlipidemia, CLL, cervical radiculopathy, degenerative disc disease, hypothyroidism.   * I certify that at the point of admission it is my clinical judgment that the patient will require inpatient hospital care spanning beyond 2 midnights from the point of admission due to high intensity of service, high risk for further deterioration and high frequency of surveillance required.Marcelyn Bruins MD [facility]  How to contact the [facility] Attending or Consulting provider [place] or covering provider during after hours [place], for this patient?   1. Check the care team in [facility] and look for a) attending/consulting [facility] provider listed and b) the [facility] team listed 2. Log into [email or web address] and use [place]'s universal password to access. If you do not have the password, please contact  the hospital operator. 3. Locate the [facility] provider you are looking for under [facility] and page to a number that you can be directly reached. 4. If you still have difficulty reaching the provider, please page the [facility] (Director on Call) for the Hospitalists listed on amion for assistance.  06/10/2020, 12:07 AM

## 2020-06-09 NOTE — ED Notes (Signed)
Monitoring and mittens replaced. Attempted to reorient patient without success. Pt alert, mumbling.

## 2020-06-09 NOTE — ED Notes (Signed)
Mittens applied due to patient attempting to pull off pulse ox with teeth. Pt repositioned for comfort. Bed alarm on and positioned under patient.

## 2020-06-09 NOTE — ED Triage Notes (Signed)
First Nurse Note:  Arrives from [facility] neurology for ED evaluation of "jumbled speech" x days, back pain, and difficulty walking.  Patient is AAOx3.  Skin warm and dry.  Speech clear, but words are jumbled intermittently.

## 2020-06-09 NOTE — ED Provider Notes (Signed)
[facility] Emergency Department Provider Note  Time seen: 9:10 PM  I have reviewed the triage vital signs and the nursing notes.   HISTORY  Chief Complaint Back Pain   HPI Tonya Myers is a 55 y.o. female with a past medical history of anxiety, arthritis, CLL, diabetes, depression, hypertension, presents to the emergency department found to be altered.  It appears that the patient was here earlier today and had lab work performed and left without being seen.  Patient is back but is not clear what complaint she has however she does appear to be quite confused.  She is having difficulty speaking will at times speak correctly and other times nonsensically.  Is having trouble answering simple questions such as the year.  Patient states she is weak but this is all on review of systems questioning.  Patient does not offer any information and appears to be confused and altered.  Patient does deny alcohol or drug use.  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  Past Medical History:  Diagnosis Date  . Acute hemorrhoid 03/11/2015  . Anxiety   . Arthritis   . Asthma   . Cancer ([place])   . Chronic back pain   . CLL (chronic lymphocytic leukemia) ([place])   . Depression   . Diabetes mellitus without complication ([place])   . Genital herpes    type 2  . Hypertension   . Hypothyroidism   . Vertigo     Patient Active Problem List   Diagnosis Date Noted  . Bacterial vaginosis 11/01/2016  . Chronic pain in right shoulder 03/23/2016  . Numbness and tingling 03/23/2016  . Uncontrolled type 2 diabetes mellitus with hyperglycemia, without long-term current use of insulin ([place]) 10/27/2015  . DDD (degenerative disc disease), cervical 07/29/2015  . Cervical disc disorder with radiculopathy of cervical region 07/29/2015  . Cervical facet syndrome 07/29/2015  . DDD (degenerative disc disease), lumbar 07/29/2015  . Facet syndrome, lumbar 07/29/2015  . Bilateral occipital neuralgia  07/29/2015  . Sacroiliac joint dysfunction 07/29/2015  . DJD of shoulder 07/29/2015  . Herpes simplex vulvovaginitis 05/29/2015  . Acute hemorrhoid 03/11/2015  . CLL (chronic lymphocytic leukemia) ([place]) 01/15/2015  . Acquired hypothyroidism 12/26/2014  . Arthritis 12/26/2014  . Carpal tunnel syndrome, bilateral 12/26/2014  . Controlled type 2 diabetes mellitus without complication, without long-term current use of insulin ([place]) 12/26/2014  . Essential hypertension 12/26/2014  . Hyperlipidemia, mixed 12/26/2014    Past Surgical History:  Procedure Laterality Date  . ABDOMINAL HYSTERECTOMY    . BILATERAL SALPINGOOPHORECTOMY  2009  . BREAST BIOPSY Right 05/17/2016   FIBROADENOMATOUS CHANGE AND SCLEROSING ADENOSIS WITH  . COLONOSCOPY WITH PROPOFOL N/A 06/16/2015   Procedure: COLONOSCOPY WITH PROPOFOL;  Surgeon: [name], MD;  Location: [facility] ENDOSCOPY;  Service: Endoscopy;  Laterality: N/A;  . LAPAROSCOPIC SUPRACERVICAL HYSTERECTOMY  2009   due to [place]  . OOPHORECTOMY      Prior to Admission medications   Medication Sig Start Date End Date Taking? Authorizing Provider  albuterol (VENTOLIN HFA) 108 (90 Base) MCG/ACT inhaler Inhale 1 puff into the lungs every 6 (six) hours as needed for wheezing or shortness of breath. Patient taking differently: Inhale 1 puff into the lungs in the morning and at bedtime.  03/15/19 03/14/20  [name], DO  Blood Glucose Monitoring Suppl (FIFTY50 GLUCOSE METER 2.0) w/Device KIT  02/25/15   [provider]  diltiazem (DILACOR XR) 120 MG 24 hr capsule Take 120 mg by  mouth daily.  12/26/14   [provider]  estradiol (ESTRACE) 0.5 MG tablet  03/14/18   [provider]  ezetimibe (ZETIA) 10 MG tablet Take 10 mg by mouth daily.     [provider]  fluticasone (FLONASE) 50 MCG/ACT nasal spray Place 1 spray into both nostrils in the morning and at bedtime.    [provider]  furosemide (LASIX) 20 MG  tablet Take 10 mg by mouth daily. Reported on 07/29/2015    [provider]  gabapentin (NEURONTIN) 400 MG capsule 400 mg. 08/28/18   [provider]  Insulin Glargine-Lixisenatide 100-33 UNT-MCG/ML SOPN Inject 24 Units into the skin daily before breakfast. 01/07/20   [provider]  insulin lispro (HUMALOG) 100 UNIT/ML injection Inject into the skin 3 (three) times daily before meals. Sliding scale average of 10 units    [provider]  Lancets Misc. (UNISTIK 2 NORMAL) MISC 1 each. 02/25/15   [provider]  levothyroxine (SYNTHROID, LEVOTHROID) 150 MCG tablet Take 150 mcg by mouth daily before breakfast.     [provider]  montelukast (SINGULAIR) 10 MG tablet Take 10 mg by mouth at bedtime.    [provider]  Jonetta Speak LANCETS 27O MISC USE THREE TIMES A DAY. USE AS INSTRUCTED 04/06/16   [provider]  predniSONE (STERAPRED UNI-PAK 21 TAB) 10 MG (21) TBPK tablet Per packaging instructions 05/25/20   Triplett, Cari B, FNP  telmisartan (MICARDIS) 80 MG tablet Take 80 mg by mouth daily.    [provider]  valACYclovir (VALTREX) 1000 MG tablet Take 1 tablet (1,000 mg total) by mouth 2 (two) times daily. 07/20/18   [name], MD  budesonide-formoterol (SYMBICORT) 160-4.5 MCG/ACT inhaler Inhale into the lungs. 07/14/18 03/15/19  [provider]    No Known Allergies  Family History  Problem Relation Age of Onset  . Cancer Paternal Aunt   . Breast cancer Maternal Aunt 70    Social History Social History   Tobacco Use  . Smoking status: Never Smoker  . Smokeless tobacco: Never Used  Vaping Use  . Vaping Use: Never used  Substance Use Topics  . Alcohol use: Yes    Alcohol/week: 1.0 standard drink    Types: 1 Cans of beer per week  . Drug use: No    Review of Systems Constitutional: Negative for fever. Cardiovascular: Negative for chest pain. Respiratory: Negative for shortness  of breath. Gastrointestinal: Negative for abdominal pain, vomiting Musculoskeletal: Negative for musculoskeletal complaints Neurological: Negative for headache All other ROS negative ___________________________________________   PHYSICAL EXAM:  VITAL SIGNS: ED Triage Vitals [06/09/20 1905]  Enc Vitals Group     BP 133/78     Pulse Rate (!) 114     Resp 20     Temp 98.3 F (36.8 C)     Temp Source Oral     SpO2 100 %     Weight 245 lb (111.1 kg)     Height 6' (1.829 m)     Head Circumference      Peak Flow      Pain Score 10     Pain Loc      Pain Edu?      Excl. in [place]?     Constitutional: Patient is awake and alert but appears quite confused has difficulty answering questions and often times will attempt answer question with nonsensical words. Eyes: Normal exam ENT      Head: Normocephalic and atraumatic.  Mouth/Throat: Dry mucous membranes. Cardiovascular: Normal rate, regular rhythm around 100 bpm..  Respiratory: Mild tachypnea but clear lung sounds bilaterally. Gastrointestinal: Soft and nontender. No distention.   Musculoskeletal: Nontender and atraumatic appearing extremities. Neurologic: Patient has trouble answering questions appears altered, occasionally answers with nonsensical words that the patient does not appear to realize.  Per triage note was complaining of difficulty ambulating x1 month.  On my examination patient has great grip strength bilaterally but has significant difficulty lifting her left leg off the bed with mild difficulty lifting her right leg.  Is not clear if this is due to confusion versus true neurologic deficit. Skin:  Skin is warm, dry and intact.  Psychiatric: Mood and affect are normal.  ____________________________________________  EKG viewed and interpreted by myself shows sinus tachycardia at 108 bpm with a narrow QRS, normal axis, normal intervals, no concerning ST changes.   RADIOLOGY  Chest x-ray performed earlier today is  negative. CT scan head is negative for acute abnormality.  ____________________________________________   INITIAL IMPRESSION / ASSESSMENT AND PLAN / ED COURSE  Pertinent labs & imaging results that were available during my care of the patient were reviewed by me and considered in my medical decision making (see chart for details).   Patient presents emergency department for altered mental status.  Given the patient's examination very high concern for possible CVA.  Unknown last normal.  We will check labs and proceed with CT imaging to further evaluate.  I did review the patient's lab work from earlier today, appear to be dehydrated with an anion gap of 16 and a mild glucose elevation.  Also had a white count of 15.  Patient's labs show dehydration, elevated blood glucose.  We will dose IV insulin, will dose IV fluids.  White blood cell count slightly elevated at 16,000.  Ammonia is negative.  Alcohol is negative.  CT scan shows no acute abnormality.  At this time is not entirely clear the patient's cause of altered mental status however she remains significantly altered.  Continues to pull off her cardiac monitoring leads and attempted to pull out her IV but cannot tell us why.  Patient acutely confused.  Patient given Ativan and Haldol via IV to help her relax while we perform a work-up.  Patient will need to be admitted to the hospitalist service for further work-up.  Per family member this is an acute and significant change from baseline.  Reviewing the patient's chart she was recently seen 2 weeks ago in the emergency department for back pain at that time the patient was prescribed a prednisone taper.  This could possibly be steroid-induced psychosis as well.  Is not entirely clear when the patient started the medication but it was called into a different pharmacy on 05/25/2020.  Daje Stark was evaluated in Emergency Department on 06/09/2020 for the symptoms described in the history of  present illness. She was evaluated in the context of the global COVID-19 pandemic, which necessitated consideration that the patient might be at risk for infection with the SARS-CoV-2 virus that causes COVID-19. Institutional protocols and algorithms that pertain to the evaluation of patients at risk for COVID-19 are in a state of rapid change based on information released by regulatory bodies including the CDC and federal and state organizations. These policies and algorithms were followed during the patient's care in the ED.  ____________________________________________   FINAL CLINICAL IMPRESSION(S) / ED DIAGNOSES  Altered mental status   Harvest Dark, MD 06/09/20 2243

## 2020-06-09 NOTE — ED Notes (Signed)
Called lab to verify tests are being run off sent tubes. RN to collect additional lab tubes at this time.

## 2020-06-09 NOTE — ED Notes (Signed)
cbg [id number]  

## 2020-06-09 NOTE — ED Notes (Signed)
Pt continues to be restless in bed, removed mittens, tele monitor, bp, and pulse ox. Pt speech is more nonsensical. Dr Kerman Passey at bedside and aware. Medication orders received.

## 2020-06-09 NOTE — ED Notes (Signed)
Dr. Kerman Passey notified of patient attempting to get out of bed, pulling off monitor, and pulling at IV. Pt requires frequent redirection and is progressively more restless. Pt is non-violent.  Medication orders received.

## 2020-06-09 NOTE — ED Notes (Addendum)
Pt c/o R flank pain x unknown amount of time. Pt appears disoriented on presentation. Able to state name, DOB, year, and some details of situation. Disoriented to month and location. Pt answers some questions inappropriately, e.g. saying "I don't have my paper" when asked about HPI. Per triage RN, pt's boyfriend stated that pt "wasn't acting right" tonight and was seen in the [facility] earlier today but left prior to d/c. Pt has mild-moderate tremor upon assessment. Pt also notes nausea. Restless in bed. Denies ETOH or drug use.

## 2020-06-09 NOTE — H&P (Incomplete)
History and Physical   Jeffie Widdowson WJX:[id number] DOB: 21-Oct-1965 DOA: 06/09/2020  PCP: [name], MD   Patient coming from: Home  Chief Complaint: Altered mental status  HPI: Tonya Myers is a 55 y.o. female with medical history significant of hypothyroidism, degenerative disc disease, cervical radiculopathy, CLL, diabetes, hypertension, hyperlipidemia presents with altered no status.  History obtained with assistance of chart review due to patient's altered mental status and likely expressive aphasia.  Patient had evidently presented earlier in the day and left without being seen after having labs done.  It was later discovered based on report from her significant other that she had thought she had seen a doctor when she had not.  When she returned to the ED she was unable to fully express while she was there and she was able to be understood at times however at other times she seemed nonsensical and had issues finding the right words to say.  On review of symptoms she did report that she has been feeling weak recently.  Has had some pain potentially in her neck recently per EDP based on his conversation with her significant other.   There was no one present with her for me to get collateral.  She does not have a contact number in her contact sheet.  She continues to have expressive aphasia.  She does seem to understand what I am saying to her and will at times nod in the affirmative.  She did follow commands as well.  ED Course: Vital signs in ED significant for respiratory rate in the teens to 20s, heart rate in the 100s to 110s.  Lab work-up showed BMP with creatinine of 1.3 and 1.5 on repeat from a baseline of 0.9.  Glucose initially in the 200s and then up to the 400s on repeat.  Bicarb 17, gap 16.  LFTs with protein 2.8, AST 50 which is stable, ALP 200, T bili 2.1.  CBC showed leukocytosis to 15, hemoglobin 10.1 which is stable from 10.9 recently.  Troponin negative x2.  Lipase  negative.  Respiratory panel for flu and Covid negative.  Ethanol negative.  Ammonia negative.  UDS and UA pending.  Initially on first evaluation but hydroxybutyric acid was elevated.  Chest x-ray was without acute abnormality, CT head was without acute abnormality.  MR brain ordered and is pending.  Patient given insulin and fluids in ED.  Review of Systems: Unable to be reliably performed due to altered mental status and aphasia.  Past Medical History:  Diagnosis Date  . Acute hemorrhoid 03/11/2015  . Anxiety   . Arthritis   . Asthma   . Cancer ([place])   . Chronic back pain   . CLL (chronic lymphocytic leukemia) ([place])   . Depression   . Diabetes mellitus without complication ([place])   . Genital herpes    type 2  . Hypertension   . Hypothyroidism   . Vertigo     Past Surgical History:  Procedure Laterality Date  . ABDOMINAL HYSTERECTOMY    . BILATERAL SALPINGOOPHORECTOMY  2009  . BREAST BIOPSY Right 05/17/2016   FIBROADENOMATOUS CHANGE AND SCLEROSING ADENOSIS WITH  . COLONOSCOPY WITH PROPOFOL N/A 06/16/2015   Procedure: COLONOSCOPY WITH PROPOFOL;  Surgeon: [name], MD;  Location: [facility] ENDOSCOPY;  Service: Endoscopy;  Laterality: N/A;  . LAPAROSCOPIC SUPRACERVICAL HYSTERECTOMY  2009   due to [place]  . OOPHORECTOMY      Social History  reports that she has never smoked. She has never used  smokeless tobacco. She reports current alcohol use of about 1.0 standard drink of alcohol per week. She reports that she does not use drugs.  No Known Allergies  Family History  Problem Relation Age of Onset  . Cancer Paternal Aunt   . Breast cancer Maternal Aunt 70  Reviewed on admission  Prior to Admission medications   Medication Sig Start Date End Date Taking? Authorizing Provider  albuterol (VENTOLIN HFA) 108 (90 Base) MCG/ACT inhaler Inhale 1 puff into the lungs every 6 (six) hours as needed for wheezing or shortness of breath. Patient taking differently: Inhale 1 puff  into the lungs in the morning and at bedtime.  03/15/19 03/14/20  [name], DO  Blood Glucose Monitoring Suppl (FIFTY50 GLUCOSE METER 2.0) w/Device KIT  02/25/15   [provider]  diltiazem (DILACOR XR) 120 MG 24 hr capsule Take 120 mg by mouth daily.  12/26/14   [provider]  estradiol (ESTRACE) 0.5 MG tablet  03/14/18   [provider]  ezetimibe (ZETIA) 10 MG tablet Take 10 mg by mouth daily.     [provider]  fluticasone (FLONASE) 50 MCG/ACT nasal spray Place 1 spray into both nostrils in the morning and at bedtime.    [provider]  furosemide (LASIX) 20 MG tablet Take 10 mg by mouth daily. Reported on 07/29/2015    [provider]  gabapentin (NEURONTIN) 400 MG capsule 400 mg. 08/28/18   [provider]  Insulin Glargine-Lixisenatide 100-33 UNT-MCG/ML SOPN Inject 24 Units into the skin daily before breakfast. 01/07/20   [provider]  insulin lispro (HUMALOG) 100 UNIT/ML injection Inject into the skin 3 (three) times daily before meals. Sliding scale average of 10 units    [provider]  Lancets Misc. (UNISTIK 2 NORMAL) MISC 1 each. 02/25/15   [provider]  levothyroxine (SYNTHROID, LEVOTHROID) 150 MCG tablet Take 150 mcg by mouth daily before breakfast.     [provider]  montelukast (SINGULAIR) 10 MG tablet Take 10 mg by mouth at bedtime.    [provider]  Jonetta Speak LANCETS 76L MISC USE THREE TIMES A DAY. USE AS INSTRUCTED 04/06/16   [provider]  predniSONE (STERAPRED UNI-PAK 21 TAB) 10 MG (21) TBPK tablet Per packaging instructions 05/25/20   Triplett, Cari B, FNP  telmisartan (MICARDIS) 80 MG tablet Take 80 mg by mouth daily.    [provider]  valACYclovir (VALTREX) 1000 MG tablet Take 1 tablet (1,000 mg total) by mouth 2 (two) times daily. 07/20/18   [name], MD  budesonide-formoterol (SYMBICORT) 160-4.5 MCG/ACT inhaler  Inhale into the lungs. 07/14/18 03/15/19  [provider]    Physical Exam: Vitals:   06/09/20 2019 06/09/20 2030 06/09/20 2100 06/09/20 2230  BP:  137/84    Pulse: (!) 113 (!) 108 (!) 110 (!) 104  Resp: (!) 24 (!) 26 (!) 28 (!) 21  Temp:      TempSrc:      SpO2:  100% 100% 99%  Weight:      Height:       Physical Exam Constitutional:      General: She is in acute distress.     Comments: Confused aphasic female who is alert and in mild distress  HENT:     Head: Normocephalic and atraumatic.     Mouth/Throat:     Mouth: Mucous membranes are moist.     Pharynx: Oropharynx is clear.  Eyes:  Extraocular Movements: Extraocular movements intact.     Pupils: Pupils are equal, round, and reactive to light.  Cardiovascular:     Rate and Rhythm: Regular rhythm. Tachycardia present.     Pulses: Normal pulses.     Heart sounds: Normal heart sounds.  Pulmonary:     Effort: Pulmonary effort is normal. No respiratory distress.     Breath sounds: Normal breath sounds.  Abdominal:     General: Bowel sounds are normal. There is no distension.     Palpations: Abdomen is soft.     Tenderness: There is no abdominal tenderness.  Musculoskeletal:        General: No swelling or deformity.     Right lower leg: No edema.     Left lower leg: No edema.  Skin:    General: Skin is warm and dry.  Neurological:     General: No focal deficit present.     Mental Status: Mental status is at baseline.     Comments: Patient with expressive aphasia.  Does seem to understand me at times.  And will nod in the affirmative at times.  She does follow commands moving each extremity individually when asked.  Strength and sensation grossly intact.    Labs on Admission: I have personally reviewed following labs and imaging studies  CBC: Recent Labs  Lab 06/09/20 1157 06/09/20 2037  WBC 15.5* 16.0*  HGB 10.1* 10.1*  HCT 31.4* 31.0*  MCV 80.1 79.3*  PLT 330 369    Basic Metabolic  Panel: Recent Labs  Lab 06/09/20 1157 06/09/20 2037  NA 137 133*  K 4.1 4.7  CL 104 100  CO2 17* 18*  GLUCOSE 274* 455*  BUN 20 30*  CREATININE 1.35* 1.56*  CALCIUM 9.3 9.2  MG 1.8  --   PHOS 3.8  --     GFR: Estimated Creatinine Clearance: 56.1 mL/min (A) (by C-G formula based on SCr of 1.56 mg/dL (H)).  Liver Function Tests: Recent Labs  Lab 06/09/20 1157 06/09/20 2037  AST 50* 44*  ALT 36 31  ALKPHOS 200* 211*  BILITOT 2.1* 2.2*  PROT 7.3 7.6  ALBUMIN 2.8* 2.8*    Urine analysis:    Component Value Date/Time   COLORURINE AMBER (A) 06/09/2020 2145   APPEARANCEUR CLOUDY (A) 06/09/2020 2145   LABSPEC 1.023 06/09/2020 2145   PHURINE 5.0 06/09/2020 2145   GLUCOSEU 150 (A) 06/09/2020 2145   HGBUR NEGATIVE 06/09/2020 2145   BILIRUBINUR NEGATIVE 06/09/2020 2145   KETONESUR NEGATIVE 06/09/2020 2145   PROTEINUR 30 (A) 06/09/2020 2145   NITRITE NEGATIVE 06/09/2020 2145   LEUKOCYTESUR NEGATIVE 06/09/2020 2145    Radiological Exams on Admission: DG Chest 2 View  Result Date: 06/09/2020 CLINICAL DATA:  Shortness of breath EXAM: CHEST - 2 VIEW COMPARISON:  05/23/2020 FINDINGS: Heart and mediastinal contours are within normal limits. No focal opacities or effusions. No acute bony abnormality. IMPRESSION: No active cardiopulmonary disease. Electronically Signed   By: Charlett Nose M.D.   On: 06/09/2020 12:25   CT Head Wo Contrast  Result Date: 06/09/2020 CLINICAL DATA:  Delirium EXAM: CT HEAD WITHOUT CONTRAST TECHNIQUE: Contiguous axial images were obtained from the base of the skull through the vertex without intravenous contrast. COMPARISON:  None. FINDINGS: Brain: No evidence of large-territorial acute infarction. No parenchymal hemorrhage. No mass lesion. No extra-axial collection. No mass effect or midline shift. No hydrocephalus. Basilar cisterns are patent. Vascular: No hyperdense vessel. Skull: No acute fracture or focal lesion. Sinuses/Orbits: Paranasal  sinuses and  mastoid air cells are clear. The orbits are unremarkable. Other: None. IMPRESSION: No acute intracranial abnormality. Electronically Signed   By: Tish Frederickson M.D.   On: 06/09/2020 22:19   EKG: Independently reviewed.  Sinus tachycardia at 108 bpm.  Baseline artifact.  QTc 452.  Assessment/Plan Principal Problem:   Acute encephalopathy Active Problems:   CLL (chronic lymphocytic leukemia) ([facility])   Acquired hypothyroidism   Essential hypertension   Uncontrolled type 2 diabetes mellitus with hyperglycemia, without long-term current use of insulin ([facility])  Acute encephalopathy > Patient presents with acute encephalopathy and has had some worsening symptoms last couple days per reports, though I have not been able to confirm this. > In the ED etiology remains unclear.  Does have leukocytosis but did also have a recent steroid taper.  Unclear if infectious etiology is contributing.  Chest x-ray is negative, urinalysis is pending, no apparent nuchal rigidity on my exam.  No fever. > I am suspicious for stroke given her apparent expressive aphasia.  She does seem to be able to understand me and is able to follow commands including all 4 extremities individually when asked. MRI is pending.  This may be difficult to obtain with her intermittent shaking/agitation.  CT head was without acute abnormality. > Possibility of steroid-induced psychosis remains however this is less likely given her steroids were given 12 days ago. > We will also check TSH given her history of hypothyroidism on Synthroid. > Has required Ativan and Haldol for agitation in the ED. Also requiring mittens due to pulling at lines. - Monitor in progressive unit - Obtain MRI brain if able - Follow-up TSH and VBG - Follow-up urinalysis and UDS - Holding off on antibiotics for now, if patient spikes a fever will need to start antibiotics and consider antivirals.  During history of HSV and this new onset multiple status.  Especially if  we do not have MRI back. - Neurochecks - Allow for permissive hypertension to 220 systolic in the event that she is having a stroke - EEG  Diabetes Hyperglycemic crisis > Initially seen this morning she did have an anion gap and mild beta hydroxybutyric acid elevation. > On repeat her glucose is gone up but her gap has closed. > We will treat with insulin drip for hyperglycemic crisis and plan to transition to subcu insulin when glucose is consistently less than 250. > Home insulin dose per chart is 24 units daily and up to 10 units 3 times daily - Plan to transition to 15 units long-acting and then let drip run for additional hour - Start on insulin drip - LR at 125 mL/hr until CBG less than 250 - Switch to D5-LR when 1 CBG less than 250 - Nothing by mouth  - CBG Q1H  AKI > Creatinine elevated to 1.5 from a baseline of 0.9 in the setting of likely borderline DKA as above. > Received 1 L bolus in ED - Giving additional liter bolus and continuing on rate per hyperglycemia crisis protocol - Avoid nephrotoxic agents - Monitor renal function electrolytes  Hypothyroidism - Checking TSH as above - Continue home Synthroid   Hypertension - Permissive hypertension as above  History of CLL  DVT prophylaxis: SCDs for now, holding off as we await MRI for possible stroke depending on size of stroke may not want to give anticoagulants. Code Status:   Will  Family Communication:  None on admission.  Known was present with her when I evaluated the patient  and she does not have any contact numbers in her chart other than her cell phone. Disposition Plan:   Patient is from:  Home  Anticipated DC to:  Pending clinical course  Anticipated DC date:  Pending clinical course  Anticipated DC barriers: None  Consults called:  None  Admission status:  Inpatient, progressive  Severity of Illness: The appropriate patient status for this patient is INPATIENT. Inpatient status is judged to be  reasonable and necessary in order to provide the required intensity of service to ensure the patient's safety. The patient's presenting symptoms, physical exam findings, and initial radiographic and laboratory data in the context of their chronic comorbidities is felt to place them at high risk for further clinical deterioration. Furthermore, it is not anticipated that the patient will be medically stable for discharge from the hospital within 2 midnights of admission. The following factors support the patient status of inpatient.   " The patient's presenting symptoms include altered mental status, neck pain. " The worrisome physical exam findings include altered mental status, confusion aphasia, tachycardia. " The initial radiographic and laboratory data are worrisome because of creatinine 1.5 from baseline 0.9, glucose 400s, hemoglobin 10.1, leukocytosis to. " The chronic co-morbidities include diabetes, hypertension, hyperlipidemia, CLL, cervical radiculopathy, degenerative disc disease, hypothyroidism.   * I certify that at the point of admission it is my clinical judgment that the patient will require inpatient hospital care spanning beyond 2 midnights from the point of admission due to high intensity of service, high risk for further deterioration and high frequency of surveillance required.Marcelyn Bruins MD [facility]  How to contact the [facility] Attending or Consulting provider [place] or covering provider during after hours [place], for this patient?   1. Check the care team in [facility] and look for a) attending/consulting [facility] provider listed and b) the [facility] team listed 2. Log into [email or web address] and use [place]'s universal password to access. If you do not have the password, please contact the hospital operator. 3. Locate the [facility] provider you are looking for under [facility] and page to a number that you can be directly reached. 4. If you still have difficulty reaching the  provider, please page the [facility] (Director on Call) for the Hospitalists listed on amion for assistance.  06/09/2020, 11:58 PM

## 2020-06-09 NOTE — ED Notes (Addendum)
This RN to triage to assess pt. Pt with tremors noted, states she is diabetic and takes insulin at home, CBG 398. Pt oriented to person , place, time and situation.  Appears shob, 97% on RA.   On the way tranferring pt to [facility], pt began having intermittent confusion with difficulty finding words. [facility] RN aware.  Verbal order for CT head Dr Kerman Passey placed

## 2020-06-09 NOTE — ED Triage Notes (Signed)
Patient c/o upper and lower back pain, difficulty speaking and difficulty ambulating X1 month. Reports it has worsened in the last couple of days.

## 2020-06-09 NOTE — ED Notes (Signed)
Full workup earlier reviewed by MD Paduchowski. MD reports no further tests at this time

## 2020-06-10 ENCOUNTER — Inpatient Hospital Stay: Payer: Self-pay

## 2020-06-10 ENCOUNTER — Other Ambulatory Visit: Payer: Self-pay

## 2020-06-10 ENCOUNTER — Inpatient Hospital Stay: Payer: Medicare Other

## 2020-06-10 DIAGNOSIS — A419 Sepsis, unspecified organism: Secondary | ICD-10-CM

## 2020-06-10 DIAGNOSIS — G9340 Encephalopathy, unspecified: Secondary | ICD-10-CM

## 2020-06-10 DIAGNOSIS — G009 Bacterial meningitis, unspecified: Secondary | ICD-10-CM

## 2020-06-10 DIAGNOSIS — R7881 Bacteremia: Secondary | ICD-10-CM

## 2020-06-10 DIAGNOSIS — E1165 Type 2 diabetes mellitus with hyperglycemia: Secondary | ICD-10-CM

## 2020-06-10 DIAGNOSIS — C9110 Chronic lymphocytic leukemia of B-cell type not having achieved remission: Secondary | ICD-10-CM

## 2020-06-10 DIAGNOSIS — R6520 Severe sepsis without septic shock: Secondary | ICD-10-CM

## 2020-06-10 DIAGNOSIS — B953 Streptococcus pneumoniae as the cause of diseases classified elsewhere: Secondary | ICD-10-CM | POA: Diagnosis not present

## 2020-06-10 DIAGNOSIS — R7989 Other specified abnormal findings of blood chemistry: Secondary | ICD-10-CM | POA: Insufficient documentation

## 2020-06-10 DIAGNOSIS — R4701 Aphasia: Secondary | ICD-10-CM

## 2020-06-10 DIAGNOSIS — E1110 Type 2 diabetes mellitus with ketoacidosis without coma: Secondary | ICD-10-CM | POA: Diagnosis not present

## 2020-06-10 DIAGNOSIS — M5430 Sciatica, unspecified side: Secondary | ICD-10-CM | POA: Diagnosis not present

## 2020-06-10 DIAGNOSIS — E119 Type 2 diabetes mellitus without complications: Secondary | ICD-10-CM

## 2020-06-10 DIAGNOSIS — G9341 Metabolic encephalopathy: Secondary | ICD-10-CM | POA: Diagnosis present

## 2020-06-10 DIAGNOSIS — N179 Acute kidney failure, unspecified: Secondary | ICD-10-CM | POA: Insufficient documentation

## 2020-06-10 LAB — CBG MONITORING, ED
Glucose-Capillary: 168 mg/dL — ABNORMAL HIGH (ref 70–99)
Glucose-Capillary: 168 mg/dL — ABNORMAL HIGH (ref 70–99)
Glucose-Capillary: 170 mg/dL — ABNORMAL HIGH (ref 70–99)
Glucose-Capillary: 179 mg/dL — ABNORMAL HIGH (ref 70–99)
Glucose-Capillary: 190 mg/dL — ABNORMAL HIGH (ref 70–99)
Glucose-Capillary: 200 mg/dL — ABNORMAL HIGH (ref 70–99)
Glucose-Capillary: 206 mg/dL — ABNORMAL HIGH (ref 70–99)
Glucose-Capillary: 250 mg/dL — ABNORMAL HIGH (ref 70–99)
Glucose-Capillary: 275 mg/dL — ABNORMAL HIGH (ref 70–99)
Glucose-Capillary: 304 mg/dL — ABNORMAL HIGH (ref 70–99)
Glucose-Capillary: 329 mg/dL — ABNORMAL HIGH (ref 70–99)
Glucose-Capillary: 350 mg/dL — ABNORMAL HIGH (ref 70–99)

## 2020-06-10 LAB — CSF CELL COUNT WITH DIFFERENTIAL
Eosinophils, CSF: 0 %
Lymphs, CSF: 0 %
Monocyte-Macrophage-Spinal Fluid: 1 %
Other Cells, CSF: 0
RBC Count, CSF: 20290 /mm3 — ABNORMAL HIGH (ref 0–3)
Segmented Neutrophils-CSF: 99 %
Tube #: 1
WBC, CSF: 63442 /mm3 (ref 0–5)

## 2020-06-10 LAB — CBC
HCT: 37.1 % (ref 36.0–46.0)
Hemoglobin: 12.3 g/dL (ref 12.0–15.0)
MCH: 25.9 pg — ABNORMAL LOW (ref 26.0–34.0)
MCHC: 33.2 g/dL (ref 30.0–36.0)
MCV: 78.1 fL — ABNORMAL LOW (ref 80.0–100.0)
Platelets: 286 10*3/uL (ref 150–400)
RBC: 4.75 MIL/uL (ref 3.87–5.11)
RDW: 15.9 % — ABNORMAL HIGH (ref 11.5–15.5)
WBC: 12.2 10*3/uL — ABNORMAL HIGH (ref 4.0–10.5)
nRBC: 0 % (ref 0.0–0.2)

## 2020-06-10 LAB — HSV DNA BY PCR (REFERENCE LAB)

## 2020-06-10 LAB — COMPREHENSIVE METABOLIC PANEL
ALT: 27 U/L (ref 0–44)
AST: 39 U/L (ref 15–41)
Albumin: 2.4 g/dL — ABNORMAL LOW (ref 3.5–5.0)
Alkaline Phosphatase: 204 U/L — ABNORMAL HIGH (ref 38–126)
Anion gap: 12 (ref 5–15)
BUN: 24 mg/dL — ABNORMAL HIGH (ref 6–20)
CO2: 21 mmol/L — ABNORMAL LOW (ref 22–32)
Calcium: 9.1 mg/dL (ref 8.9–10.3)
Chloride: 106 mmol/L (ref 98–111)
Creatinine, Ser: 1.17 mg/dL — ABNORMAL HIGH (ref 0.44–1.00)
GFR, Estimated: 55 mL/min — ABNORMAL LOW (ref >60)
Glucose, Bld: 191 mg/dL — ABNORMAL HIGH (ref 70–99)
Potassium: 3.7 mmol/L (ref 3.5–5.1)
Sodium: 139 mmol/L (ref 135–145)
Total Bilirubin: 2.4 mg/dL — ABNORMAL HIGH (ref 0.3–1.2)
Total Protein: 7.2 g/dL (ref 6.5–8.1)

## 2020-06-10 LAB — BLOOD CULTURE ID PANEL (REFLEXED) - BCID2

## 2020-06-10 LAB — BLOOD GAS, VENOUS
Acid-base deficit: 2.9 mmol/L — ABNORMAL HIGH (ref 0.0–2.0)
Bicarbonate: 21.1 mmol/L (ref 20.0–28.0)
O2 Saturation: 42.7 %
Patient temperature: 37
pCO2, Ven: 34 mmHg — ABNORMAL LOW (ref 44.0–60.0)
pH, Ven: 7.4 (ref 7.250–7.430)
pO2, Ven: <31 mmHg — CL (ref 32.0–45.0)

## 2020-06-10 LAB — PROTEIN AND GLUCOSE, CSF
Glucose, CSF: <20 mg/dL — CL (ref 40–70)
Total  Protein, CSF: >600 mg/dL — ABNORMAL HIGH (ref 15–45)

## 2020-06-10 LAB — HEMOGLOBIN A1C
Hgb A1c MFr Bld: 8 % — ABNORMAL HIGH (ref 4.8–5.6)
Mean Plasma Glucose: 183 mg/dL

## 2020-06-10 LAB — PROTIME-INR
INR: 1.3 — ABNORMAL HIGH (ref 0.8–1.2)
Prothrombin Time: 15.2 seconds (ref 11.4–15.2)

## 2020-06-10 LAB — PATHOLOGIST SMEAR REVIEW

## 2020-06-10 LAB — STREP PNEUMONIAE URINARY ANTIGEN: Strep Pneumo Urinary Antigen: POSITIVE — AB

## 2020-06-10 LAB — APTT: aPTT: 28 seconds (ref 24–36)

## 2020-06-10 IMAGING — MR MR HEAD W/O CM
12 of 14 series · 40 of 48 positions shown · non-contrast
Comparison: None.

CLINICAL DATA: Altered mental status. Possible drug overdose.
Seroquel.

EXAM:
MRI HEAD WITHOUT CONTRAST
TECHNIQUE: Multiplanar, multiecho pulse sequences of the brain and surrounding
structures were obtained without intravenous contrast.

[Series 5: ax dwi_tracew · axial · 3.0mm · 0.60mm/px · z∈[-109,+45]mm · 5 of 48 slices shown]
[im 1/48]
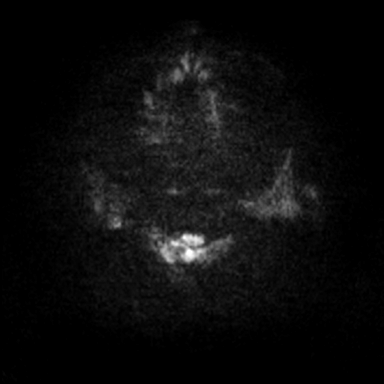
[im 12/48]
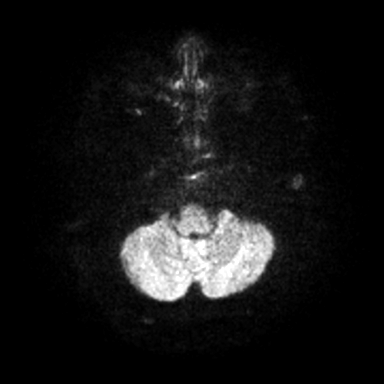
[im 24/48]
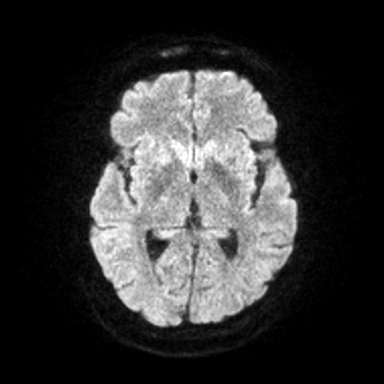
[im 36/48]
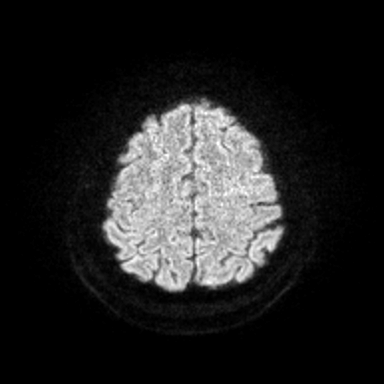
[im 48/48]
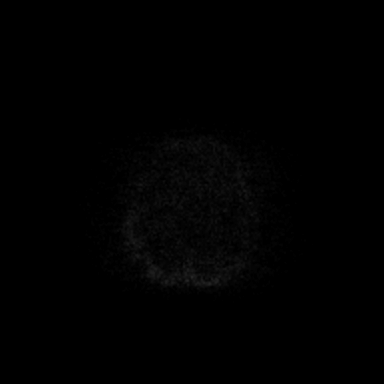

[Series 6: ax dwi_adc · axial · 3.0mm · 0.60mm/px · z∈[-109,+41]mm · 5 of 47 slices shown]
[im 1/47]
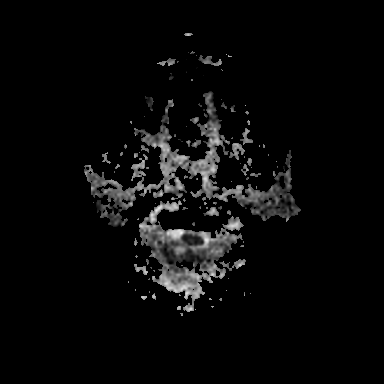
[im 12/47]
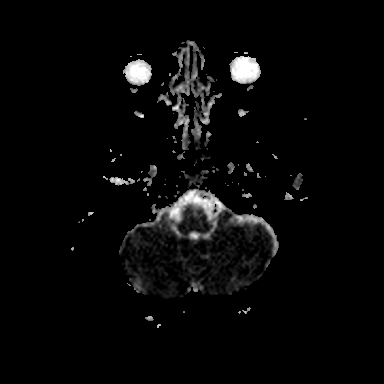
[im 24/47]
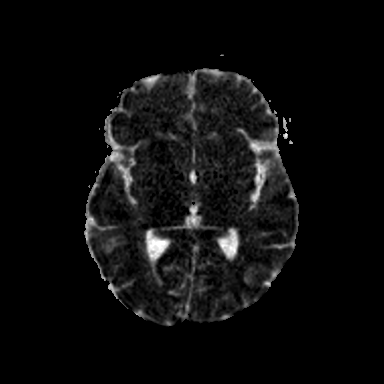
[im 35/47]
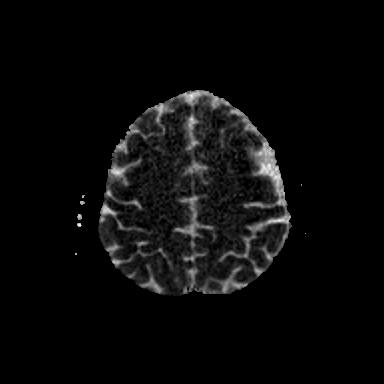
[im 47/47]
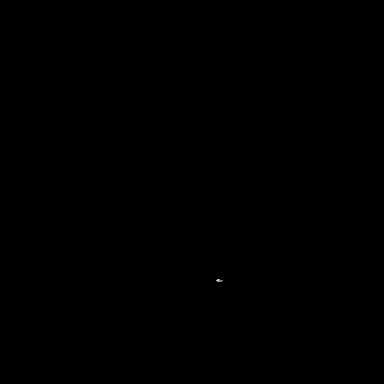

[Series 7: cor dwi_tracew · coronal · 5.0mm · 0.60mm/px · 5 of 40 slices shown]
[im 1/40]
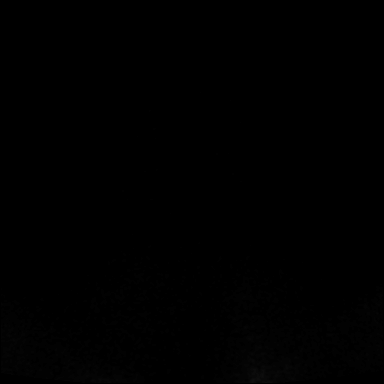
[im 10/40]
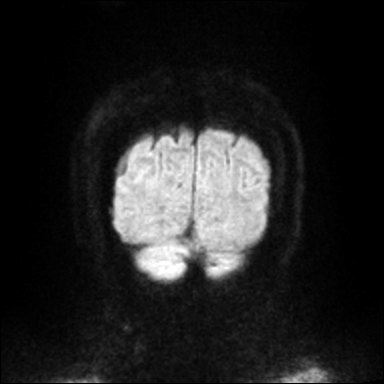
[im 20/40]
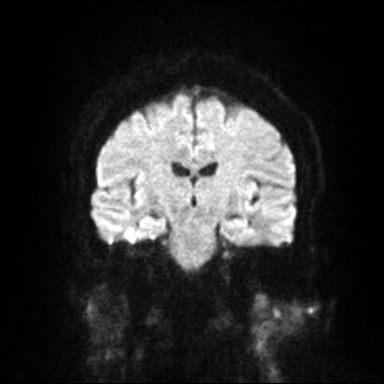
[im 30/40]
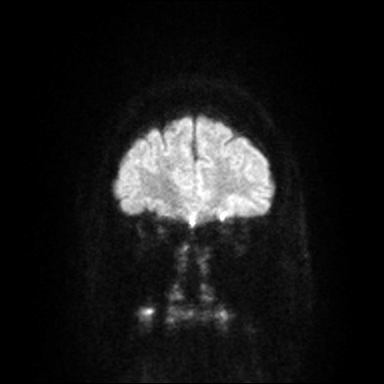
[im 40/40]
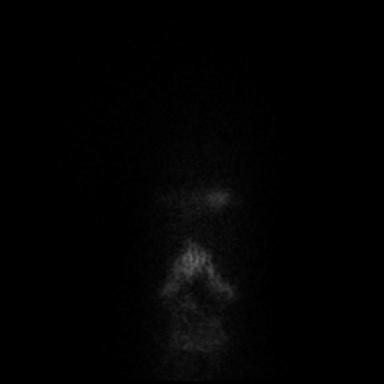

[Series 8: cor dwi_adc · coronal · 5.0mm · 0.60mm/px · 3 of 34 slices shown]
[im 1/34]
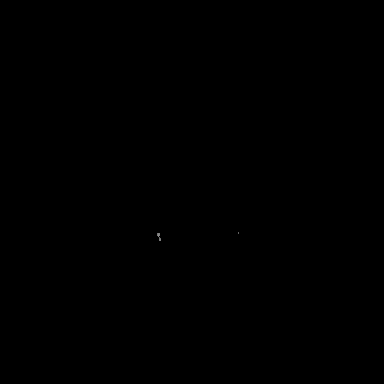
[im 17/34]
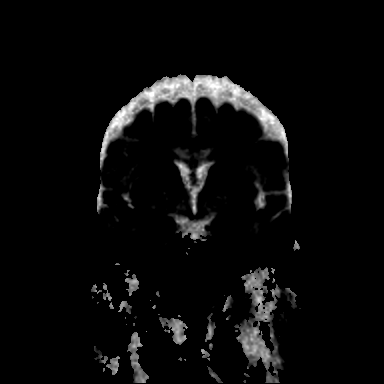
[im 34/34]
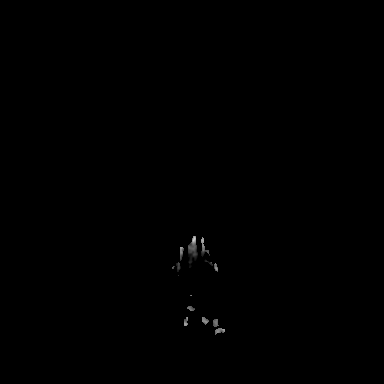

[Series 9: T1 · sagittal · 5.0mm · 0.94mm/px · 2 of 23 slices shown (1 of 2)]
[im 1/23]
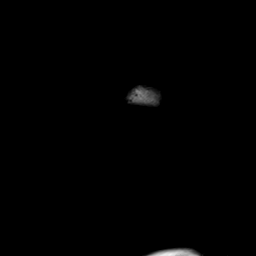
[im 23/23]
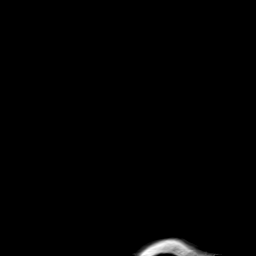

[Series 10: T2 · axial · 5.0mm · 0.53mm/px · z∈[-104,+38]mm · 2 of 25 slices shown (1 of 2)]
[im 1/25]
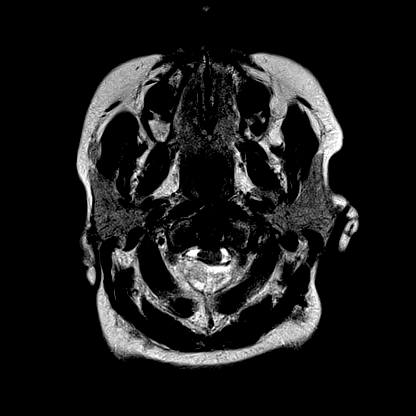
[im 25/25]
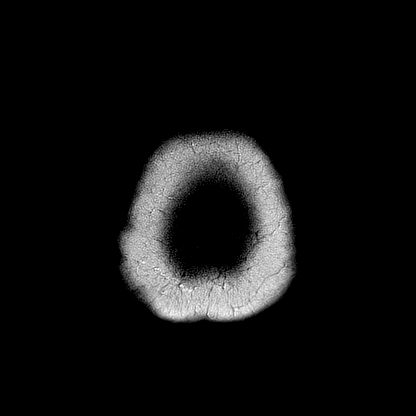

[Series 11: T2-star · axial · 5.0mm · 0.45mm/px · z∈[-109,+45]mm · 2 of 27 slices shown]
[im 1/27]
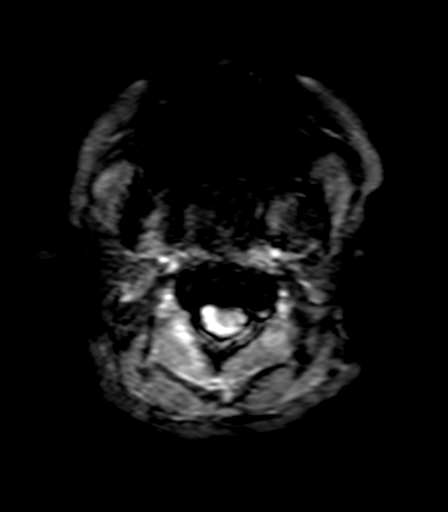
[im 27/27]
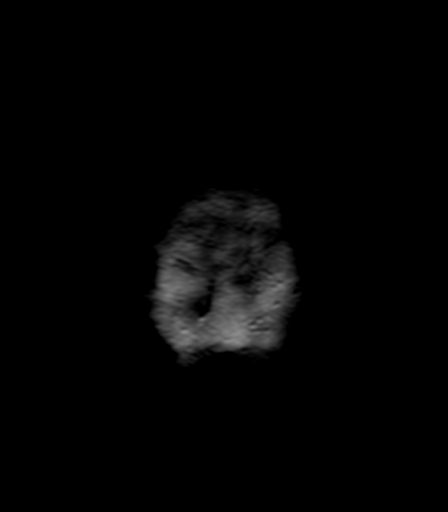

[Series 13: pha_images · axial · 3.0mm · 0.90mm/px · z∈[-113,+52]mm · 5 of 57 slices shown]
[im 1/57]
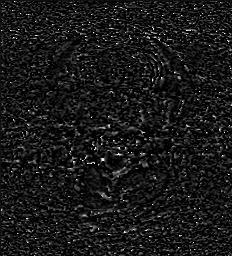
[im 15/57]
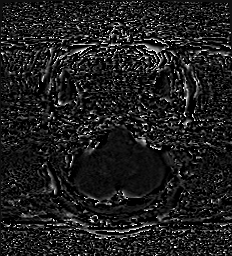
[im 29/57]
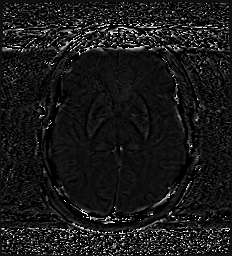
[im 43/57]
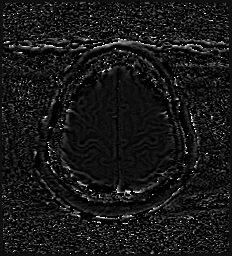
[im 57/57]
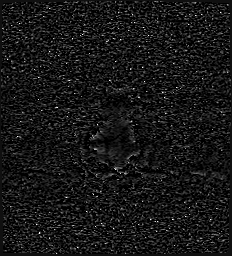

[Series 14: swi_images · axial · 3.0mm · 0.90mm/px · z∈[-119,-16]mm · 4 of 60 slices shown]
[im 1/60]
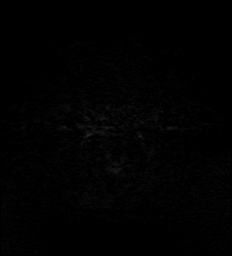
[im 12/60]
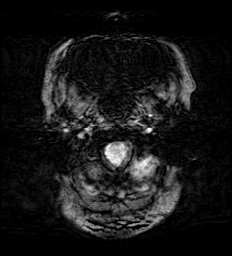
[im 24/60]
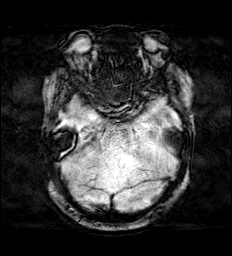
[im 36/60]
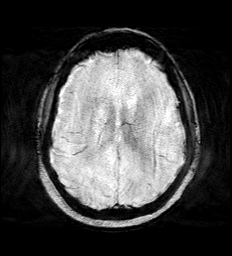

[Series 16: FLAIR · axial · 5.0mm · 1.20mm/px · z∈[-109,+45]mm · 2 of 27 slices shown]
[im 1/27]
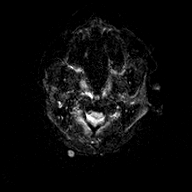
[im 27/27]
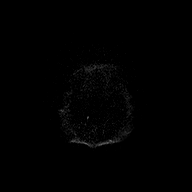

[Series 17: T1 · axial · 5.0mm · 0.90mm/px · z∈[-109,+45]mm · 2 of 27 slices shown (2 of 2)]
[im 1/27]
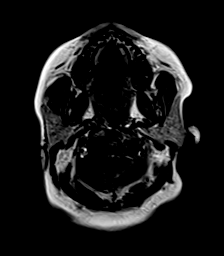
[im 27/27]
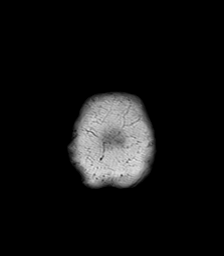

[Series 18: T2 · coronal · 5.0mm · 0.45mm/px · 3 of 31 slices shown (2 of 2)]
[im 1/31]
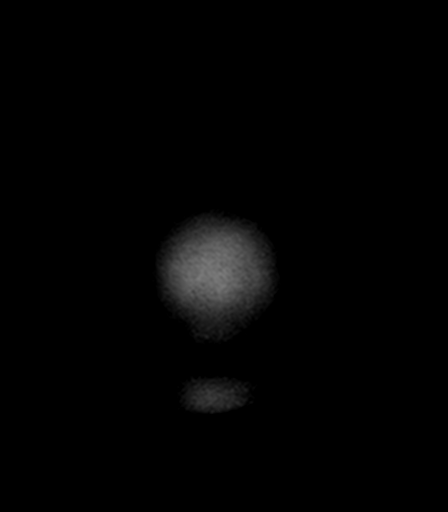
[im 16/31]
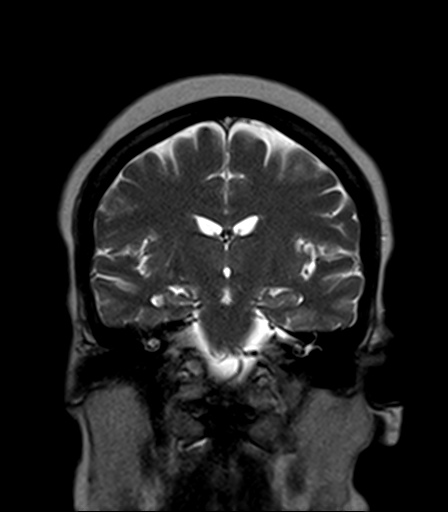
[im 31/31]
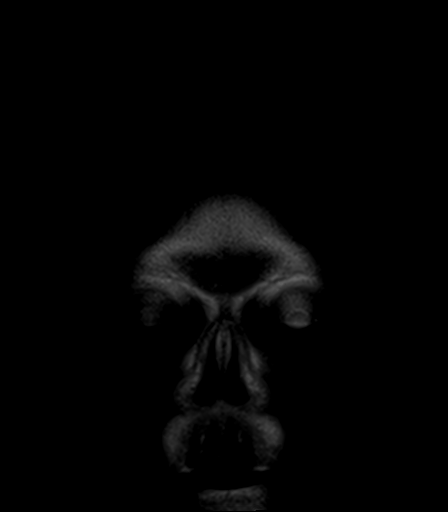

[40 of 48 positions shown; findings below may reference images not displayed]

FINDINGS: Brain: No acute infarct, mass effect or extra-axial collection. No
acute or chronic hemorrhage. Normal white matter signal, parenchymal
volume and CSF spaces. The midline structures are normal.

Vascular: Major flow voids are preserved.

Skull and upper cervical spine: Normal calvarium and skull base.
Visualized upper cervical spine and soft tissues are normal.

Sinuses/Orbits:No paranasal sinus fluid levels or advanced mucosal
thickening. No mastoid or middle ear effusion. Normal orbits.
IMPRESSION: Normal brain MRI.

## 2020-06-10 IMAGING — RF DG SPINAL PUNCT LUMBAR DIAG WITH FL CT GUIDANCE
1 series · 1 of 1 positions shown · non-contrast
Comparison: none

CLINICAL DATA: Concern for meningitis

[Series 1: cp_standard · 0.17mm/px · 1 of 1 slices shown]
[im 1/1]
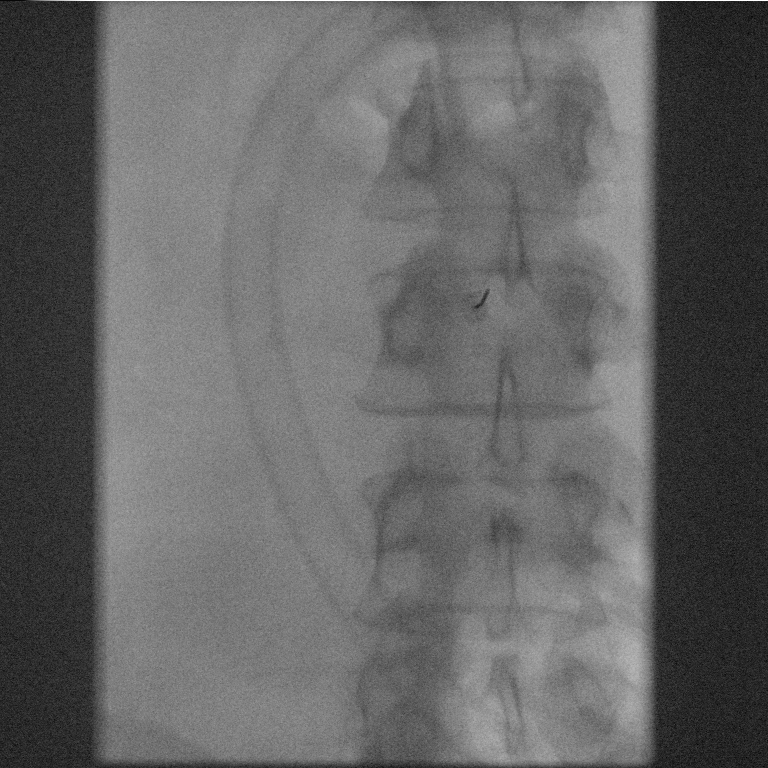

[1 of 1 positions shown; findings below may reference images not displayed]

EXAM:
DIAGNOSTIC LUMBAR PUNCTURE UNDER FLUOROSCOPIC GUIDANCE

FLUOROSCOPY TIME:  Fluoroscopy Time:  18 seconds

Radiation Exposure Index (if provided by the fluoroscopic device):
4.2 mGy

PROCEDURE:
Informed consent was obtained from the patient prior to the
procedure, including potential complications of headache, allergy,
and pain. With the patient prone, the lower back was prepped with
Betadine. 1% Lidocaine was used for local anesthesia. Lumbar
puncture was performed at the L2-L3 level using a 22 gauge needle
with return of turbid CSF. 1 ml of CSF obtained for laboratory
studies. The patient tolerated the procedure well and there were no
apparent complications.
IMPRESSION: Technically successful fluoroscopic guided lumbar puncture.

## 2020-06-10 MED ORDER — INSULIN GLARGINE 100 UNIT/ML ~~LOC~~ SOLN
25.0000 [IU] | Freq: Every day | SUBCUTANEOUS | Status: DC
  Administered 2020-06-10: 25 [IU] via SUBCUTANEOUS
  Filled 2020-06-10 (×2): qty 0.25

## 2020-06-10 MED ORDER — VANCOMYCIN HCL IN DEXTROSE 1-5 GM/200ML-% IV SOLN
1000.0000 mg | Freq: Two times a day (BID) | INTRAVENOUS | Status: DC
  Administered 2020-06-10 – 2020-06-12 (×4): 1000 mg via INTRAVENOUS
  Filled 2020-06-10 (×6): qty 200

## 2020-06-10 MED ORDER — DEXTROSE 5 % IV SOLN
10.0000 mg/kg | Freq: Three times a day (TID) | INTRAVENOUS | Status: DC
  Filled 2020-06-10 (×4): qty 17.7

## 2020-06-10 MED ORDER — DEXAMETHASONE SODIUM PHOSPHATE 10 MG/ML IJ SOLN: 6.0000 mg | Freq: Three times a day (TID) | INTRAMUSCULAR | Status: DC

## 2020-06-10 MED ORDER — VANCOMYCIN HCL 1500 MG/300ML IV SOLN
1500.0000 mg | INTRAVENOUS | Status: DC
  Filled 2020-06-10: qty 300

## 2020-06-10 MED ORDER — SODIUM CHLORIDE 0.9 % IV SOLN: 1.0000 g | Freq: Four times a day (QID) | INTRAVENOUS | Status: DC

## 2020-06-10 MED ORDER — LORAZEPAM 2 MG/ML IJ SOLN
0.5000 mg | Freq: Once | INTRAMUSCULAR | Status: AC
Start: 2020-06-10 — End: 2020-06-10
  Administered 2020-06-10: 0.5 mg via INTRAVENOUS

## 2020-06-10 MED ORDER — METOPROLOL TARTRATE 5 MG/5ML IV SOLN: 5.0000 mg | Freq: Once | INTRAVENOUS | Status: DC

## 2020-06-10 MED ORDER — SODIUM CHLORIDE 0.9 % IV SOLN
2.0000 g | Freq: Two times a day (BID) | INTRAVENOUS | Status: DC
  Administered 2020-06-10 – 2020-06-27 (×33): 2 g via INTRAVENOUS
  Filled 2020-06-10 (×3): qty 2
  Filled 2020-06-10 (×5): qty 20
  Filled 2020-06-10 (×4): qty 2
  Filled 2020-06-10 (×3): qty 20
  Filled 2020-06-10: qty 2
  Filled 2020-06-10 (×2): qty 20
  Filled 2020-06-10: qty 2
  Filled 2020-06-10: qty 20
  Filled 2020-06-10 (×4): qty 2
  Filled 2020-06-10: qty 20
  Filled 2020-06-10 (×3): qty 2
  Filled 2020-06-10 (×6): qty 20

## 2020-06-10 MED ORDER — DEXAMETHASONE SODIUM PHOSPHATE 10 MG/ML IJ SOLN: 10.0000 mg | Freq: Once | INTRAMUSCULAR | Status: DC

## 2020-06-10 MED ORDER — INSULIN ASPART 100 UNIT/ML ~~LOC~~ SOLN
0.0000 [IU] | Freq: Every day | SUBCUTANEOUS | Status: DC
  Administered 2020-06-10: 2 [IU] via SUBCUTANEOUS
  Filled 2020-06-10: qty 1

## 2020-06-10 MED ORDER — INSULIN ASPART 100 UNIT/ML ~~LOC~~ SOLN
0.0000 [IU] | Freq: Three times a day (TID) | SUBCUTANEOUS | Status: DC
  Administered 2020-06-10: 2 [IU] via SUBCUTANEOUS
  Filled 2020-06-10: qty 1

## 2020-06-10 MED ORDER — SODIUM CHLORIDE 0.9 % IV SOLN
2.0000 g | INTRAVENOUS | Status: DC
  Filled 2020-06-10 (×7): qty 2000

## 2020-06-10 MED ORDER — MIDAZOLAM HCL 2 MG/2ML IJ SOLN
1.0000 mg | Freq: Once | INTRAMUSCULAR | Status: AC
  Administered 2020-06-10: 1 mg via INTRAVENOUS
  Filled 2020-06-10: qty 2

## 2020-06-10 MED ORDER — SODIUM CHLORIDE 0.9 % IV SOLN
2.0000 g | Freq: Two times a day (BID) | INTRAVENOUS | Status: DC
  Administered 2020-06-10: 2 g via INTRAVENOUS
  Filled 2020-06-10: qty 20

## 2020-06-10 MED ORDER — LORAZEPAM 2 MG/ML IJ SOLN
INTRAMUSCULAR | Status: AC
  Filled 2020-06-10: qty 1

## 2020-06-10 MED ORDER — SODIUM CHLORIDE 0.9 % IV SOLN
2.0000 g | Freq: Three times a day (TID) | INTRAVENOUS | Status: DC
  Administered 2020-06-10: 2 g via INTRAVENOUS
  Filled 2020-06-10: qty 2

## 2020-06-10 MED ORDER — VANCOMYCIN HCL 2000 MG/400ML IV SOLN
2000.0000 mg | Freq: Once | INTRAVENOUS | Status: AC
  Administered 2020-06-10: 2000 mg via INTRAVENOUS
  Filled 2020-06-10: qty 400

## 2020-06-10 MED ORDER — MIDAZOLAM HCL 2 MG/2ML IJ SOLN
2.0000 mg | Freq: Once | INTRAMUSCULAR | Status: AC
  Administered 2020-06-10: 2 mg via INTRAVENOUS
  Filled 2020-06-10: qty 2

## 2020-06-10 NOTE — ED Notes (Signed)
Pt back from LP. Pt in flat position at this time. Family at bedside and informed of pt position and reasoning.

## 2020-06-10 NOTE — ED Notes (Addendum)
Pablo Ledger, NP notified of tachycardia per standing orders. No new orders given at this time.

## 2020-06-10 NOTE — Procedures (Signed)
Lumbar Puncture Procedure Note  Tonya Myers  [id number]  1965/12/06  Date:06/10/20  Time:4:05 PM   Provider Performing:Joban Colledge Posey Pronto   Procedure: Fluoroscopic Guided Lumbar Puncture    Indication(s) AMS  Consent Risks of the procedure as well as the alternatives and risks of each were explained to the patient and/or caregiver.  Consent for the procedure was obtained and is signed in the bedside chart  Anesthesia Topical only with 1% lidocaine    Time Out Verified patient identification, verified procedure, site/side was marked, verified correct patient position, special equipment/implants available, medications/allergies/relevant history reviewed, required imaging and test results available.   Sterile Technique Maximal sterile technique including sterile barrier drape, hand hygiene, sterile gown, sterile gloves, mask, hair covering.    Procedure Description Lidocaine used to anesthetize skin and subcutaneous tissue overlying this area.  A 22g spinal needle was then used to access the subarachnoid space at the L2-L3 level. 1 mL turbid CSF obtained. Very slow flow despite attempts at repositioning. See PACS for full dictation.   Complications/Tolerance None; patient tolerated the procedure well.   EBL Minimal   Specimen(s) CSF

## 2020-06-10 NOTE — ED Notes (Signed)
Pt transported to xray for LP 

## 2020-06-10 NOTE — ED Notes (Signed)
Unable to obtain serial Bps due to patient frequently removing BP cuff when staff not in room.

## 2020-06-10 NOTE — Progress Notes (Signed)
PHARMACY - PHYSICIAN COMMUNICATION CRITICAL VALUE ALERT - BLOOD CULTURE IDENTIFICATION (BCID)  Tonya Myers is an 55 y.o. female who presented to [facility] on 06/09/2020 with a chief complaint of acute encephalopathy  Assessment:  Initial assessment was questionable for meningitis.  Blood culture + for Strep pneumo in 1  (aerobic) of 3 bottles.  No resistance noted.  Name of physician (or Provider) Contacted: Dr Leslye Peer  Current antibiotics: Rocephin 2gms IV BID Vancomycin 1000mg  IV BID  Changes to prescribed antibiotics recommended:  Patient is on recommended antibiotics - No changes needed.  No changes until further evaluation.  Results for orders placed or performed during the hospital encounter of 06/09/20  Blood Culture ID Panel (Reflexed) (Collected: 06/10/2020  9:40 AM)  Result Value Ref Range   Enterococcus faecalis NOT DETECTED NOT DETECTED   Enterococcus Faecium NOT DETECTED NOT DETECTED   Listeria monocytogenes NOT DETECTED NOT DETECTED   Staphylococcus species NOT DETECTED NOT DETECTED   Staphylococcus aureus (BCID) NOT DETECTED NOT DETECTED   Staphylococcus epidermidis NOT DETECTED NOT DETECTED   Staphylococcus lugdunensis NOT DETECTED NOT DETECTED   Streptococcus species DETECTED (A) NOT DETECTED   Streptococcus agalactiae NOT DETECTED NOT DETECTED   Streptococcus pneumoniae DETECTED (A) NOT DETECTED   Streptococcus pyogenes NOT DETECTED NOT DETECTED   A.calcoaceticus-baumannii NOT DETECTED NOT DETECTED   Bacteroides fragilis NOT DETECTED NOT DETECTED   Enterobacterales NOT DETECTED NOT DETECTED   Enterobacter cloacae complex NOT DETECTED NOT DETECTED   Escherichia coli NOT DETECTED NOT DETECTED   Klebsiella aerogenes NOT DETECTED NOT DETECTED   Klebsiella oxytoca NOT DETECTED NOT DETECTED   Klebsiella pneumoniae NOT DETECTED NOT DETECTED   Proteus species NOT DETECTED NOT DETECTED   Salmonella species NOT DETECTED NOT DETECTED   Serratia marcescens NOT  DETECTED NOT DETECTED   Haemophilus influenzae NOT DETECTED NOT DETECTED   Neisseria meningitidis NOT DETECTED NOT DETECTED   Pseudomonas aeruginosa NOT DETECTED NOT DETECTED   Stenotrophomonas maltophilia NOT DETECTED NOT DETECTED   Candida albicans NOT DETECTED NOT DETECTED   Candida auris NOT DETECTED NOT DETECTED   Candida glabrata NOT DETECTED NOT DETECTED   Candida krusei NOT DETECTED NOT DETECTED   Candida parapsilosis NOT DETECTED NOT DETECTED   Candida tropicalis NOT DETECTED NOT DETECTED   Cryptococcus neoformans/gattii NOT DETECTED NOT DETECTED    Tonya Myers 06/10/2020  7:42 PM

## 2020-06-10 NOTE — Progress Notes (Signed)
Pharmacy - vancomycin addendum  Rule out meningitis  Adjust vancomycin to 1gm IV q12h for a goal trough of 15-20 mcg/mL - monitor renal function - Await CSF results  Doreene Eland, PharmD, BCPS.   Work Cell: [phone] 06/10/2020 3:34 PM

## 2020-06-10 NOTE — Procedures (Signed)
Attempted EEG. Pt was moving and raising up off bed.  Pt's partner agreed to try again tomorrow.

## 2020-06-10 NOTE — ED Notes (Signed)
Per MD DC insulin infusion

## 2020-06-10 NOTE — ED Notes (Signed)
X-ray called in regards to LP. Orders placed for APTT and Protime-INR per request

## 2020-06-10 NOTE — ED Notes (Signed)
Pt more calm at this time, although remains restless.

## 2020-06-10 NOTE — Consult Note (Signed)
NAME: Tonya Myers  DOB: 29-Mar-1966  MRN: [id number]  Date/Time: 06/10/2020 8:07 PM  REQUESTING PROVIDER: Dr. Leslye Peer Subjective:  REASON FOR CONSULT: bacterial menigitis No hisotry available from patient- chart reviewed- spoke to husband at bed side? Tonya Myers is a 55 y.o. female with a history of DM, CLL HTN Presented to the ED being sent from [facility] neurology for altered mental status on 06/09/20 Pt had gone to see neurology for trouble speaking and was referred to ED  On 04/22/20 she saw pain med for back pain  and got trigger point steroid injections .  05/21/20- cough and had televisit with her  PCP  On 05/23/20 she went to [facility] ED for the same and got CT abdomen  Which showed some rt lower lobe volume loss MRI was recommended. COVID test then was negative  On 05/26/20  She got left hip joint steroid injection.  On 06/03/20 went to PCP with left side sciatica- referred to ortho, given steroids On 2/10 she had trouble speaking and was referred to neurology who sent her to the ED She came to the ED and left after labs , not being seen but came back later in the evening confused Vitals in the ED 137/84, 98.3, HR 93, sats 100% Glucose was high, WBC 15 CT head was ordered as the diagnosis was thought to be stroke VS sterpid induced psychosis neuroconsult was requested- seen by neurology today and LP ordered and started on antibiotics for possible mennigits Csf examination showed gram positive diplococci And I am asked to see the patient    Past Medical History:  Diagnosis Date  . Acute hemorrhoid 03/11/2015  . Anxiety   . Arthritis   . Asthma   . Cancer ([place])   . Chronic back pain   . CLL (chronic lymphocytic leukemia) ([place])   . Depression   . Diabetes mellitus without complication ([place])   . Genital herpes    type 2  . Hypertension   . Hypothyroidism   . Vertigo     Past Surgical History:  Procedure Laterality Date  . ABDOMINAL HYSTERECTOMY    . BILATERAL  SALPINGOOPHORECTOMY  2009  . BREAST BIOPSY Right 05/17/2016   FIBROADENOMATOUS CHANGE AND SCLEROSING ADENOSIS WITH  . COLONOSCOPY WITH PROPOFOL N/A 06/16/2015   Procedure: COLONOSCOPY WITH PROPOFOL;  Surgeon: [name], MD;  Location: [facility] ENDOSCOPY;  Service: Endoscopy;  Laterality: N/A;  . LAPAROSCOPIC SUPRACERVICAL HYSTERECTOMY  2009   due to [place]  . OOPHORECTOMY      Social History   Socioeconomic History  . Marital status: Single    Spouse name: Not on file  . Number of children: Not on file  . Years of education: Not on file  . Highest education level: Not on file  Occupational History  . Not on file  Tobacco Use  . Smoking status: Never Smoker  . Smokeless tobacco: Never Used  Vaping Use  . Vaping Use: Never used  Substance and Sexual Activity  . Alcohol use: Yes    Alcohol/week: 1.0 standard drink    Types: 1 Cans of beer per week  . Drug use: No  . Sexual activity: Not Currently    Birth control/protection: None  Other Topics Concern  . Not on file  Social History Narrative  . Not on file   Social Determinants of Health   Financial Resource Strain: Not on file  Food Insecurity: Not on file  Transportation Needs: Not on file  Physical Activity: Not  on file  Stress: Not on file  Social Connections: Not on file  Intimate Partner Violence: Not on file    Family History  Problem Relation Age of Onset  . Cancer Paternal Aunt   . Breast cancer Maternal Aunt 70   No Known Allergies   ? Current Facility-Administered Medications  Medication Dose Route Frequency Provider Last Rate Last Admin  . acetaminophen (TYLENOL) tablet 650 mg  650 mg Oral Q6H PRN Marcelyn Bruins, MD       Or  . acetaminophen (TYLENOL) suppository 650 mg  650 mg Rectal Q6H PRN Marcelyn Bruins, MD   650 mg at 06/10/20 1355  . albuterol (VENTOLIN HFA) 108 (90 Base) MCG/ACT inhaler 1 puff  1 puff Inhalation Q6H PRN Marcelyn Bruins, MD      . cefTRIAXone (ROCEPHIN) 2 g  in sodium chloride 0.9 % 100 mL IVPB  2 g Intravenous Q12H Candace Begue, MD      . dextrose 50 % solution 0-50 mL  0-50 mL Intravenous PRN Marcelyn Bruins, MD      . haloperidol lactate (HALDOL) injection 2 mg  2 mg Intravenous Q6H PRN Marcelyn Bruins, MD   2 mg at 06/10/20 1803  . insulin aspart (novoLOG) injection 0-5 Units  0-5 Units Subcutaneous QHS Wieting, Richard, MD      . insulin aspart (novoLOG) injection 0-9 Units  0-9 Units Subcutaneous TID WC Loletha Grayer, MD   2 Units at 06/10/20 1723  . insulin glargine (LANTUS) injection 25 Units  25 Units Subcutaneous Daily Loletha Grayer, MD   25 Units at 06/10/20 1354  . lactated ringers infusion   Intravenous Continuous Loletha Grayer, MD   Stopped at 06/10/20 0459  . levothyroxine (SYNTHROID) tablet 150 mcg  150 mcg Oral Q0600 Marcelyn Bruins, MD      . polyethylene glycol (MIRALAX / GLYCOLAX) packet 17 g  17 g Oral Daily PRN Marcelyn Bruins, MD      . sodium chloride flush (NS) 0.9 % injection 3 mL  3 mL Intravenous Q12H Marcelyn Bruins, MD   3 mL at 06/10/20 0911  . vancomycin (VANCOCIN) IVPB 1000 mg/200 mL premix  1,000 mg Intravenous Q12H Berton [place], [facility]       Current Outpatient Medications  Medication Sig Dispense Refill  . cyclobenzaprine (FLEXERIL) 10 MG tablet Take 10 mg by mouth 3 (three) times daily.    Marland Kitchen diltiazem (DILACOR XR) 120 MG 24 hr capsule Take 120 mg by mouth daily.     Marland Kitchen estradiol (ESTRACE) 0.5 MG tablet Take 0.5 mg by mouth daily.  5  . ezetimibe (ZETIA) 10 MG tablet Take 10 mg by mouth daily.     . furosemide (LASIX) 20 MG tablet Take 20 mg by mouth daily.    . Insulin Glargine-Lixisenatide 100-33 UNT-MCG/ML SOPN Inject 48 Units into the skin daily before breakfast.    . insulin lispro (HUMALOG) 200 UNIT/ML KwikPen Inject 100 Units into the skin daily. Sliding scale average of 10 units    . levocetirizine (XYZAL) 5 MG tablet Take 5 mg by mouth at bedtime.    Marland Kitchen levothyroxine  (SYNTHROID, LEVOTHROID) 150 MCG tablet Take 150 mcg by mouth daily before breakfast.     . metFORMIN (GLUCOPHAGE-XR) 500 MG 24 hr tablet Take 500 mg by mouth 2 (two) times daily.    . montelukast (SINGULAIR) 10 MG tablet Take 10 mg by mouth at bedtime.    . naproxen (NAPROSYN)  500 MG tablet Take 500 mg by mouth 2 (two) times daily.    . pregabalin (LYRICA) 75 MG capsule Take 75 mg by mouth 2 (two) times daily.    . rosuvastatin (CRESTOR) 10 MG tablet Take 10 mg by mouth at bedtime.    Marland Kitchen telmisartan-hydrochlorothiazide (MICARDIS HCT) 80-25 MG tablet Take 1 tablet by mouth daily.    . valACYclovir (VALTREX) 1000 MG tablet Take 1 tablet (1,000 mg total) by mouth 2 (two) times daily. 60 tablet 1     Abtx:  Anti-infectives (From admission, onward)   Start     Dose/Rate Route Frequency Ordered Stop   06/11/20 1000  vancomycin (VANCOREADY) IVPB 1500 mg/300 mL  Status:  Discontinued        1,500 mg 150 mL/hr over 120 Minutes Intravenous Every 24 hours 06/10/20 0931 06/10/20 1503   06/10/20 2200  vancomycin (VANCOCIN) IVPB 1000 mg/200 mL premix        1,000 mg 200 mL/hr over 60 Minutes Intravenous Every 12 hours 06/10/20 1503     06/10/20 2200  cefTRIAXone (ROCEPHIN) 2 g in sodium chloride 0.9 % 100 mL IVPB        2 g 200 mL/hr over 30 Minutes Intravenous Every 12 hours 06/10/20 1649     06/10/20 1600  ampicillin (OMNIPEN) 2 g in sodium chloride 0.9 % 100 mL IVPB  Status:  Discontinued        2 g 300 mL/hr over 20 Minutes Intravenous Every 4 hours 06/10/20 1441 06/10/20 1648   06/10/20 1515  ceFEPIme (MAXIPIME) 2 g in sodium chloride 0.9 % 100 mL IVPB  Status:  Discontinued        2 g 200 mL/hr over 30 Minutes Intravenous Every 8 hours 06/10/20 1502 06/10/20 1648   06/10/20 1445  ampicillin (OMNIPEN) 1 g in sodium chloride 0.9 % 100 mL IVPB  Status:  Discontinued        1 g 300 mL/hr over 20 Minutes Intravenous Every 6 hours 06/10/20 1435 06/10/20 1441   06/10/20 1000  cefTRIAXone (ROCEPHIN) 2  g in sodium chloride 0.9 % 100 mL IVPB  Status:  Discontinued        2 g 200 mL/hr over 30 Minutes Intravenous Every 12 hours 06/10/20 0837 06/10/20 1435   06/10/20 1000  acyclovir (ZOVIRAX) 885 mg in dextrose 5 % 150 mL IVPB  Status:  Discontinued        10 mg/kg  88.3 kg (Adjusted) 167.7 mL/hr over 60 Minutes Intravenous Every 8 hours 06/10/20 0927 06/10/20 1648   06/10/20 0930  vancomycin (VANCOREADY) IVPB 2000 mg/400 mL        2,000 mg 200 mL/hr over 120 Minutes Intravenous  Once 06/10/20 0921 06/10/20 1322      REVIEW OF SYSTEMS:  NA Objective:  VITALS:  BP (!) 163/100   Pulse (!) 127   Temp 99 F (37.2 C) (Rectal)   Resp (!) 26   Ht 6' (1.829 m)   Wt 111.1 kg   SpO2 98%   BMI 33.23 kg/m  PHYSICAL EXAM:  General: agitated and confused Non verbal Does not follow commands Tossing in bed, lying in bed in prone position Head: Normocephalic, without obvious abnormality, atraumatic. Eyes: cannot examine ENT cannot examine Back: No rash Lungs: b/l air entry Heart: Tachycardia Abdomen: Soft, non-tender,not distended. Bowel sounds normal. No masses Extremities: atraumatic, no cyanosis. No edema. No clubbing Skin: No rashes or lesions. Or bruising Lymph: Cervical, supraclavicular normal. Neurologic: cannot assess Pertinent  Labs Lab Results CBC    Component Value Date/Time   WBC 12.2 (H) 06/10/2020 0600   RBC 4.75 06/10/2020 0600   HGB 12.3 06/10/2020 0600   HCT 37.1 06/10/2020 0600   PLT 286 06/10/2020 0600   MCV 78.1 (L) 06/10/2020 0600   MCH 25.9 (L) 06/10/2020 0600   MCHC 33.2 06/10/2020 0600   RDW 15.9 (H) 06/10/2020 0600   LYMPHSABS 1.6 01/17/2020 1053   MONOABS 0.3 01/17/2020 1053   EOSABS 0.2 01/17/2020 1053   BASOSABS 0.0 01/17/2020 1053    CMP Latest Ref Rng & Units 06/10/2020 06/09/2020 06/09/2020  Glucose 70 - 99 mg/dL 191(H) 455(H) 274(H)  BUN 6 - 20 mg/dL 24(H) 30(H) 20  Creatinine 0.44 - 1.00 mg/dL 1.17(H) 1.56(H) 1.35(H)  Sodium 135 - 145  mmol/L 139 133(L) 137  Potassium 3.5 - 5.1 mmol/L 3.7 4.7 4.1  Chloride 98 - 111 mmol/L 106 100 104  CO2 22 - 32 mmol/L 21(L) 18(L) 17(L)  Calcium 8.9 - 10.3 mg/dL 9.1 9.2 9.3  Total Protein 6.5 - 8.1 g/dL 7.2 7.6 7.3  Total Bilirubin 0.3 - 1.2 mg/dL 2.4(H) 2.2(H) 2.1(H)  Alkaline Phos 38 - 126 U/L 204(H) 211(H) 200(H)  AST 15 - 41 U/L 39 44(H) 50(H)  ALT 0 - 44 U/L 27 31 36   Csf RBC 20,290 WBC 63, 442 ( 99%) protein > 6000 Glucose < 20 Gram stain: gram positive diplococci      Microbiology: Recent Results (from the past 240 hour(s))  Resp Panel by RT-PCR (Flu A&B, Covid) Nasopharyngeal Swab     Status: None   Collection Time: 06/09/20  9:45 PM   Specimen: Nasopharyngeal Swab; Nasopharyngeal(NP) swabs in vial transport medium  Result Value Ref Range Status   SARS Coronavirus 2 by RT PCR NEGATIVE NEGATIVE Final    Comment: (NOTE) SARS-CoV-2 target nucleic acids are NOT DETECTED.  The SARS-CoV-2 RNA is generally detectable in upper respiratory specimens during the acute phase of infection. The lowest concentration of SARS-CoV-2 viral copies this assay can detect is 138 copies/mL. A negative result does not preclude SARS-Cov-2 infection and should not be used as the sole basis for treatment or other patient management decisions. A negative result may occur with  improper specimen collection/handling, submission of specimen other than nasopharyngeal swab, presence of viral mutation(s) within the areas targeted by this assay, and inadequate number of viral copies(<138 copies/mL). A negative result must be combined with clinical observations, patient history, and epidemiological information. The expected result is Negative.  Fact Sheet for Patients:  EntrepreneurPulse.com.au  Fact Sheet for Healthcare Providers:  IncredibleEmployment.be  This test is no t yet approved or cleared by the [place] FDA and  has been authorized for  detection and/or diagnosis of SARS-CoV-2 by FDA under an Emergency Use Authorization (EUA). This EUA will remain  in effect (meaning this test can be used) for the duration of the COVID-19 declaration under Section 564(b)(1) of the Act, 21 U.S.C.section 360bbb-3(b)(1), unless the authorization is terminated  or revoked sooner.       Influenza A by PCR NEGATIVE NEGATIVE Final   Influenza B by PCR NEGATIVE NEGATIVE Final    Comment: (NOTE) The Xpert Xpress SARS-CoV-2/FLU/RSV plus assay is intended as an aid in the diagnosis of influenza from Nasopharyngeal swab specimens and should not be used as a sole basis for treatment. Nasal washings and aspirates are unacceptable for Xpert Xpress SARS-CoV-2/FLU/RSV testing.  Fact Sheet for Patients: EntrepreneurPulse.com.au  Fact Sheet for Healthcare  Providers: IncredibleEmployment.be  This test is not yet approved or cleared by the [place] and has been authorized for detection and/or diagnosis of SARS-CoV-2 by FDA under an Emergency Use Authorization (EUA). This EUA will remain in effect (meaning this test can be used) for the duration of the COVID-19 declaration under Section 564(b)(1) of the Act, 21 U.S.C. section 360bbb-3(b)(1), unless the authorization is terminated or revoked.  Performed at [facility], [address] [place], [place] [address]   CULTURE, BLOOD (ROUTINE X 2) w Reflex to ID Panel     Status: None (Preliminary result)   Collection Time: 06/10/20  9:40 AM   Specimen: BLOOD  Result Value Ref Range Status   Specimen Description BLOOD BLOOD LEFT FOREARM  Final   Special Requests   Final    BOTTLES DRAWN AEROBIC AND ANAEROBIC Blood Culture adequate volume   Culture  Setup Time   Final    Organism ID to follow IN BOTH AEROBIC AND ANAEROBIC BOTTLES GRAM POSITIVE COCCI CRITICAL RESULT CALLED TO, READ BACK BY AND VERIFIED WITH: SUSAN WATSON @1941  ON 06/10/20  SKL Performed at [facility], [address], [place], [place] [address]    Culture GRAM POSITIVE COCCI  Final   Report Status PENDING  Incomplete  Blood Culture ID Panel (Reflexed)     Status: Abnormal   Collection Time: 06/10/20  9:40 AM  Result Value Ref Range Status   Enterococcus faecalis NOT DETECTED NOT DETECTED Final   Enterococcus Faecium NOT DETECTED NOT DETECTED Final   Listeria monocytogenes NOT DETECTED NOT DETECTED Final   Staphylococcus species NOT DETECTED NOT DETECTED Final   Staphylococcus aureus (BCID) NOT DETECTED NOT DETECTED Final   Staphylococcus epidermidis NOT DETECTED NOT DETECTED Final   Staphylococcus lugdunensis NOT DETECTED NOT DETECTED Final   Streptococcus species DETECTED (A) NOT DETECTED Final    Comment: CRITICAL RESULT CALLED TO, READ BACK BY AND VERIFIED WITH: SUSAN WATSON @1941  ON 06/10/20 SKL    Streptococcus agalactiae NOT DETECTED NOT DETECTED Final   Streptococcus pneumoniae DETECTED (A) NOT DETECTED Final    Comment: CRITICAL RESULT CALLED TO, READ BACK BY AND VERIFIED WITH: SUSAN WATSON @1941  ON 06/10/20 SKL    Streptococcus pyogenes NOT DETECTED NOT DETECTED Final   A.calcoaceticus-baumannii NOT DETECTED NOT DETECTED Final   Bacteroides fragilis NOT DETECTED NOT DETECTED Final   Enterobacterales NOT DETECTED NOT DETECTED Final   Enterobacter cloacae complex NOT DETECTED NOT DETECTED Final   Escherichia coli NOT DETECTED NOT DETECTED Final   Klebsiella aerogenes NOT DETECTED NOT DETECTED Final   Klebsiella oxytoca NOT DETECTED NOT DETECTED Final   Klebsiella pneumoniae NOT DETECTED NOT DETECTED Final   Proteus species NOT DETECTED NOT DETECTED Final   Salmonella species NOT DETECTED NOT DETECTED Final   Serratia marcescens NOT DETECTED NOT DETECTED Final   Haemophilus influenzae NOT DETECTED NOT DETECTED Final   Neisseria meningitidis NOT DETECTED NOT DETECTED Final   Pseudomonas aeruginosa NOT DETECTED NOT DETECTED Final    Stenotrophomonas maltophilia NOT DETECTED NOT DETECTED Final   Candida albicans NOT DETECTED NOT DETECTED Final   Candida auris NOT DETECTED NOT DETECTED Final   Candida glabrata NOT DETECTED NOT DETECTED Final   Candida krusei NOT DETECTED NOT DETECTED Final   Candida parapsilosis NOT DETECTED NOT DETECTED Final   Candida tropicalis NOT DETECTED NOT DETECTED Final   Cryptococcus neoformans/gattii NOT DETECTED NOT DETECTED Final    Comment: Performed at [facility], [address] [place],  [place] [address]  CSF culture w Stat Gram Stain     Status: None (Preliminary result)   Collection Time: 06/10/20  2:19 PM   Specimen: CSF; Cerebrospinal Fluid  Result Value Ref Range Status   Specimen Description CSF  Final   Special Requests NONE  Final   Gram Stain   Final    GRAM POSITIVE DIPLOCOCCI CRITICAL RESULT CALLED TO, READ BACK BY AND VERIFIED WITH: KORY ANDREWS @1627  06/10/20 MJU WBC SEEN RBC SEEN Performed at [facility], [address] [place], [place] [address]    Culture PENDING  Incomplete   Report Status PENDING  Incomplete    IMAGING RESULTS:  I have personally reviewed the films ?CXR no infiltrate  CT head- no abnormality  Impression/Recommendation ? Pneumococcal meningitis with pneumococcal bacteremia- currently on vanco and ceftriaxone DC acyclovir and ampicillin Will repeat blood culture Will get 2 d echo Too late to give Dexamethasone as she has received antibiotics 6 hrs ago. Steroids have to be given before or with 1 st dose of antibiotic for meninigitis with pneumococcus  Back pain with sciatica as per history- need MRI of the spine to look for infection. Recent steroid injection and oral steroid use puts her at risk for infection  DM- was in D[facility] Management as per primary team  CLL   ? ? ___________________________________________________ Discussed with her husband and requesting provider Note:  This document was prepared using  Dragon voice recognition software and may include unintentional dictation errors.

## 2020-06-10 NOTE — Progress Notes (Signed)
Pharmacy Antibiotic Note  Tonya Myers is a 55 y.o. female admitted on 06/09/2020 with sepsis and acute encephalopathy.  Pharmacy has been consulted for Vancomycin and Acylovir dosing.  Plan:  Vancomycin 2000mg  IV loading dose followed by 1500mg  IV q24h.       Goal AUC 400-550.       Expected AUC: 483.7       SCr used: 1.17       Expected Cmin: 10.4   Acyclovir 885 mg (10mg /kg using ABW) IV q8h   Maintain adequate hydration while on Acyclovir  Monitor SCr daily    Height: 6' (182.9 cm) Weight: 111.1 kg (245 lb) IBW/kg (Calculated) : 73.1  Temp (24hrs), Avg:99.1 F (37.3 C), Min:98 F (36.7 C), Max:100.9 F (38.3 C)  Recent Labs  Lab 06/09/20 1157 06/09/20 2037 06/10/20 0600  WBC 15.5* 16.0* 12.2*  CREATININE 1.35* 1.56* 1.17*    Estimated Creatinine Clearance: 74.8 mL/min (A) (by C-G formula based on SCr of 1.17 mg/dL (H)).    No Known Allergies  Antimicrobials this admission: Vancomycin 2/15 >>  Acyclovir 2/15 >>   Dose adjustments this admission:  Microbiology results:   Thank you for allowing pharmacy to be a part of this patient's care.  Paulina Fusi, PharmD, BCPS 06/10/2020 9:38 AM

## 2020-06-10 NOTE — Consult Note (Addendum)
NEUROLOGY CONSULTATION NOTE   Date of service: June 10, 2020 Patient Name: Tonya Myers MRN:  [id number] DOB:  [birth year] Reason for consult: "Fever and AMS" _ _ _   _ __   _ __ _ _  __ __   _ __   __ _  History of Present Illness  Tonya Myers is a 55 y.o. female with PMH significant for HTN, DM2, Hypothyroidism, hx of CLL, hx of Genital Herpes who presented on 06/09/2020 with altered mental status and concern for aphasia, tachycardia.  Work-up with CBC demonstrating leukocytosis, labs concerning for hyperglycemic crisis, AKI with creatinine of 1.5 from a baseline of 0.9.  She had MRI brain without contrast which was negative for a stroke.  Today in the morning, patient was noted to be persistently encephalopathic and she also spiked a fever with a T-max of 100.9.  We were asked to evaluate this patient to evaluate for a neurological cause of her altered mental status.  Vitals with persistent tachycardia, elevated blood pressure and satting in mid 90s on room air.  Her leukocytosis and her creatinine is trended down.  Glucose which was elevated to 400s is now down to 179.  Rest of her labs with elevated total bilirubin to 2.4 along with elevated alkaline phosphatase to 204.  On my evaluation patient is unable to provide any meaningful history.  She appears encephalopathic, she does seem to follow basic commands.  She did not produce any speech during my interaction with her.  Patient's significant other is at bedside and is able to provide history.  He reports the patient has been reporting back pain over the last few weeks.  She was seen here in the ED in late January for the same.  Work-up with CT abdomen pelvis was largely nonrevealing except for pneumonia and findings consistent with her history of CLL.  She was treated with a course of steroid taper and discharged.  She continues to report low back pain going down into her legs.  Over the last couple days, she was more confused and did  not seem to be talking right and has went downhill since.  He reports the patient was able to walk fine and do pretty much everything by herself until last night.  She had no bowel or bladder incontinence and she did not report any numbness around her groin.  ROS  Unable to obtain a detailed review of system due to encephalopathy. Past History   Past Medical History:  Diagnosis Date  . Acute hemorrhoid 03/11/2015  . Anxiety   . Arthritis   . Asthma   . Cancer ([place])   . Chronic back pain   . CLL (chronic lymphocytic leukemia) ([place])   . Depression   . Diabetes mellitus without complication ([place])   . Genital herpes    type 2  . Hypertension   . Hypothyroidism   . Vertigo    Past Surgical History:  Procedure Laterality Date  . ABDOMINAL HYSTERECTOMY    . BILATERAL SALPINGOOPHORECTOMY  2009  . BREAST BIOPSY Right 05/17/2016   FIBROADENOMATOUS CHANGE AND SCLEROSING ADENOSIS WITH  . COLONOSCOPY WITH PROPOFOL N/A 06/16/2015   Procedure: COLONOSCOPY WITH PROPOFOL;  Surgeon: [name], MD;  Location: [facility] ENDOSCOPY;  Service: Endoscopy;  Laterality: N/A;  . LAPAROSCOPIC SUPRACERVICAL HYSTERECTOMY  2009   due to [place]  . OOPHORECTOMY     Family History  Problem Relation Age of Onset  . Cancer Paternal Aunt   . Breast cancer  Maternal Aunt 70   Social History   Socioeconomic History  . Marital status: Single    Spouse name: Not on file  . Number of children: Not on file  . Years of education: Not on file  . Highest education level: Not on file  Occupational History  . Not on file  Tobacco Use  . Smoking status: Never Smoker  . Smokeless tobacco: Never Used  Vaping Use  . Vaping Use: Never used  Substance and Sexual Activity  . Alcohol use: Yes    Alcohol/week: 1.0 standard drink    Types: 1 Cans of beer per week  . Drug use: No  . Sexual activity: Not Currently    Birth control/protection: None  Other Topics Concern  . Not on file  Social History Narrative   . Not on file   Social Determinants of Health   Financial Resource Strain: Not on file  Food Insecurity: Not on file  Transportation Needs: Not on file  Physical Activity: Not on file  Stress: Not on file  Social Connections: Not on file   No Known Allergies  Medications  (Not in a hospital admission)    Vitals   Vitals:   06/10/20 0420 06/10/20 0500 06/10/20 0505 06/10/20 0546  BP: (!) 146/85 (!) 174/89  (!) 160/97  Pulse: (!) 121 (!) 129 (!) 108 (!) 133  Resp: (!) 33 (!) 26 (!) 23 (!) 22  Temp:    (!) 100.9 F (38.3 C)  TempSrc:    Rectal  SpO2: 96%  100% 95%  Weight:      Height:         Body mass index is 33.23 kg/m.  Physical Exam   General: Laying in bed, sweating, somewhat tachypneic and tachycardic. HENT: Normal oropharynx and mucosa. Normal external appearance of ears and nose. Neck: Supple, no pain or tenderness CV: No JVD. No peripheral edema. Pulmonary: Symmetric Chest rise. Tachypneic. Abdomen: Soft to touch, non-tender. Ext: No cyanosis, edema, or deformity Skin: No rash. Normal palpation of skin.  Musculoskeletal: Normal digits and nails by inspection. No clubbing.  Neurologic Examination  Mental status/Cognition: Somnolent but eyes open to voice,Briefly looks at my face. Follows basic commands with some encouragement. Did not talk at all for me. Unable to assess orientation. Speech/language: mute. Difficult to assess if this is just encephalopathy but some concern for expressive aphasia out of proportion to encephalopathy. Does seem to follow basic commands thou. Cranial nerves:   CN II Pupils equal and reactive to light, blinks to threat BL.   CN III,IV,VI EOM intact to dolls eyes, no gaze preference or deviation, no nystagmus   CN V normal sensation in V1, V2, and V3 segments bilaterally   CN VII no asymmetry, no nasolabial fold flattening   CN VIII normal hearing to speech   CN IX & X    CN XI    CN XII    Motor:  Muscle bulk: normal,  tone normal, tremor none.  Unable to do detailed strength testing but she moves all extremities spontaneously and does follow basic commands.  Reflexes:  Right Left Comments  Pectoralis      Biceps (C5/6) 2 2   Brachioradialis (C5/6) 2 2    Triceps (C6/7) 1 1    Patellar (L3/4) 2 2    Achilles (S1)      Hoffman      Plantar     Jaw jerk    Sensation:  Light touch Withdraws to  nailbed pressure in all extremities.   Pin prick    Temperature    Vibration   Proprioception    Coordination/Complex Motor:  Unable to assess but she does not have any obvious ataxia or tremors.  Labs   CBC:  Recent Labs  Lab 06/09/20 2037 06/10/20 0600  WBC 16.0* 12.2*  HGB 10.1* 12.3  HCT 31.0* 37.1  MCV 79.3* 78.1*  PLT 369 659    Basic Metabolic Panel:  Lab Results  Component Value Date   NA 139 06/10/2020   K 3.7 06/10/2020   CO2 21 (L) 06/10/2020   GLUCOSE 191 (H) 06/10/2020   BUN 24 (H) 06/10/2020   CREATININE 1.17 (H) 06/10/2020   CALCIUM 9.1 06/10/2020   GFRNONAA 55 (L) 06/10/2020   GFRAA >60 01/17/2020   Lipid Panel: No results found for: LDLCALC HgbA1c: No results found for: HGBA1C Urine Drug Screen:     Component Value Date/Time   LABOPIA NONE DETECTED 06/09/2020 2145   COCAINSCRNUR NONE DETECTED 06/09/2020 2145   LABBENZ NONE DETECTED 06/09/2020 2145   AMPHETMU NONE DETECTED 06/09/2020 2145   THCU NONE DETECTED 06/09/2020 2145   LABBARB NONE DETECTED 06/09/2020 2145    Alcohol Level     Component Value Date/Time   ETH <10 06/09/2020 2037    CT Head without contrast: CTH was negative for a large hypodensity concerning for a large territory infarct or hyperdensity concerning for an ICH  MRI Brain  Personally reviewed and negative for an acute infarct.  rEEG: pending  Impression   Tracie Dore is a 55 y.o. female with PMH significant for HTN, DM2, Hypothyroidism, hx of CLL, hx of Genital Herpes who presented on 06/09/2020 with altered mental status and  concern for aphasia, tachycardia and fever today with a Tmax of 100.9. Her neurologic examination is notable for somnolence, some concern for expressive aphasia out of proportion to encephalopathy along with nuchal rigidity. MRI Brain without contrast is negative. Even with the nexgative MRI, exam is highly concerning for a potential CNS infection. Unclear what to make of her back pain, she does not seem to have any clear findings based on limited history from her significant other and no clear upper motor neuron findings on exam currently to suggest cord compression from a potential abscess.   I also reached out to the primary team and will defer to them for workup for LFT derangement and the noted hepatic abnormalities on her CT A/P.  Recommendations  - Agree with broad spectrum coverage with Vanc, Cefepime, Ampicillin and Acyclovir - I ordered LP with CSF cell count, differential x 2 tubes, protein, glucose, gram stain and cultures. HSV PCR, Cryptococcus Ag, Strep Pneumo urinary antigen. - Blood cultures have been collected and pending. - rEEG has been ordered and pending. - Will continue to follow and expand workup based on above. ______________________________________________________________________   Thank you for the opportunity to take part in the care of this patient. If you have any further questions, please contact the neurology consultation attending.  Signed,  [place] Pager Number [phone] _ _ _   _ __   _ __ _ _  __ __   _ __   __ _

## 2020-06-10 NOTE — Progress Notes (Signed)
Inpatient Diabetes Program Recommendations  AACE/ADA: New Consensus Statement on Inpatient Glycemic Control (2015)  Target Ranges:  Prepandial:   less than 140 mg/dL      Peak postprandial:   less than 180 mg/dL (1-2 hours)      Critically ill patients:  140 - 180 mg/dL   Lab Results  Component Value Date   GLUCAP 179 (H) 06/10/2020    Review of Glycemic Control  Diabetes history: DM2 Outpatient Diabetes medications: Lantus/Lixienatide 24 units qd + Humalog 10 units tid meal coverage Current orders for Inpatient glycemic control: IV insulin  Inpatient Diabetes Program Recommendations:   When patient ready to transition: -Give Lantus 22 units (0.2 units/kg x 111.1 kg) 2 hrs prior to IV insulin discontinued then cover CBG @ time of IV insulin discontinued with Novolog correction -Add Novolog meal coverage when eating -Novolog 0-9 units q 4 hrs while NPO  Noted pending A1c and will follow during hospitalization.  Thank you, Nani Gasser. Mamta Rimmer, RN, MSN, CDE  Diabetes Coordinator Inpatient Glycemic Control Team Team Pager [phone] (8am-5pm) 06/10/2020 9:35 AM

## 2020-06-10 NOTE — ED Notes (Addendum)
Pt's boyfriend at bedside at this time, states pt has been having trouble "finding her words" for a few days and c/o excessive thirst.  Sts patient is normally A&Ox4.  Labs drawn by phlebotomist.

## 2020-06-10 NOTE — Progress Notes (Signed)
Patient ID: Tonya Myers, female   DOB: 06/26/1965, 55 y.o.   MRN: [id number]  Neurology wanted to add ampicillin for antibiotic coverage for possible Listeria.  Wanted to change ceftriaxone to cefepime just in case Pseudomonas.  Continue vancomycin and acyclovir.  Dr. Leslye Peer

## 2020-06-10 NOTE — ED Notes (Addendum)
Pt transported to and from MRI on monitor by this RN.

## 2020-06-10 NOTE — ED Notes (Signed)
Pt is pulling on IVs and infusion lines. Pt is also rolling around in bed unable to reorient or consol. MD notified

## 2020-06-10 NOTE — Progress Notes (Addendum)
Pharmacy Antibiotic Note  Tonya Myers is a 55 y.o. female admitted on 06/09/2020 with sepsis and acute encephalopathy (possible meningitis).    Pharmacy has been consulted for Cefepime dosing for possible pseudomonas coverage.  Plan:  Start Cefepime 2g q8h  Continue Vanc/Acyclovir as below   Vancomycin 2000mg  IV loading dose followed by 1500mg  IV q24h.       Goal AUC 400-550.       Expected AUC: 483.7       SCr used: 1.17       Expected Cmin: 10.4   Acyclovir 885 mg (10mg /kg using ABW) IV q8h   Maintain adequate hydration while on Acyclovir  Monitor SCr daily    Height: 6' (182.9 cm) Weight: 111.1 kg (245 lb) IBW/kg (Calculated) : 73.1  Temp (24hrs), Avg:100 F (37.8 C), Min:98.3 F (36.8 C), Max:100.9 F (38.3 C)  Recent Labs  Lab 06/09/20 1157 06/09/20 2037 06/10/20 0600  WBC 15.5* 16.0* 12.2*  CREATININE 1.35* 1.56* 1.17*    Estimated Creatinine Clearance: 74.8 mL/min (A) (by C-G formula based on SCr of 1.17 mg/dL (H)).    No Known Allergies  Antimicrobials this admission: Ceftriaxone 2g IV x 1 Vancomycin 2/15 >>  Acyclovir 2/15 >>  Ampicillin 2/15 >> Cefepime 2/15 >>   Dose adjustments this admission:  Microbiology results: BCx 2/15 pending CSF from LP 2/15 pending COVID/FLU NEG  Thank you for allowing pharmacy to be a part of this patient's care.  Lu Duffel, PharmD, BCPS Clinical Pharmacist 06/10/2020 3:00 PM

## 2020-06-10 NOTE — ED Notes (Signed)
Phlebotomist called for morning labs.

## 2020-06-10 NOTE — ED Notes (Signed)
Pablo Ledger NP at bedside for reeval.. Pt medicated for fever and repositioned in bed for comfort.

## 2020-06-10 NOTE — ED Notes (Signed)
Unsuccessful IV attempt x 2. VBG sent, RRT notified.

## 2020-06-10 NOTE — ED Notes (Signed)
Secure chat with Sharion Settler, NP for bed change to stepdown. Order given for versed for agitation prior to MRI.

## 2020-06-10 NOTE — ED Notes (Signed)
Md messaged to transition pt off of endo tool

## 2020-06-10 NOTE — ED Notes (Signed)
Message to pharmacy to verify medications.

## 2020-06-10 NOTE — Progress Notes (Signed)
Patient ID: Tonya Myers, female   DOB: 1965/08/20, 55 y.o.   MRN: [id number] [facility] PROGRESS NOTE  Tonya Myers FBP:[id number] DOB: [birth year] DOA: 06/09/2020 PCP: [name], MD  HPI/Subjective: Patient seen this morning.  Brought in with altered mental status.  Has not had a fever.  She has been having some low back pain going on for a while.  She was unable to speak very well today.  He was also found to be in diabetic ketoacidosis and was on insulin drip this morning.  Objective: Vitals:   06/10/20 0505 06/10/20 0546  BP:  (!) 160/97  Pulse: (!) 108 (!) 133  Resp: (!) 23 (!) 22  Temp:  (!) 100.9 F (38.3 C)  SpO2: 100% 95%    Intake/Output Summary (Last 24 hours) at 06/10/2020 0838 Last data filed at 06/10/2020 0459 Gross per 24 hour  Intake 1356.25 ml  Output 50 ml  Net 1306.25 ml   Filed Weights   06/09/20 1905  Weight: 111.1 kg    ROS: Review of Systems  Unable to perform ROS: Acuity of condition   Exam: Physical Exam HENT:     Head: Normocephalic.     Mouth/Throat:     Pharynx: No oropharyngeal exudate.  Eyes:     General: Lids are normal.     Conjunctiva/sclera: Conjunctivae normal.     Pupils: Pupils are equal, round, and reactive to light.  Cardiovascular:     Rate and Rhythm: Regular rhythm. Tachycardia present.     Heart sounds: Normal heart sounds, S1 normal and S2 normal.  Pulmonary:     Breath sounds: Examination of the right-lower field reveals decreased breath sounds. Examination of the left-lower field reveals decreased breath sounds. Decreased breath sounds present. No wheezing, rhonchi or rales.  Abdominal:     Palpations: Abdomen is soft.     Tenderness: There is no abdominal tenderness.  Musculoskeletal:     Cervical back: Rigidity present.     Right lower leg: Swelling present.     Left lower leg: Swelling present.  Skin:    General: Skin is warm.     Findings: No rash.  Neurological:     Mental Status: She is  lethargic.     Comments: Patient tries to talk but unable to understand her.  She was moving her arms on her own.  90 days when I lifted her legs with passive range of motion she did not have pain in her lower back.       Data Reviewed: Basic Metabolic Panel: Recent Labs  Lab 06/09/20 1157 06/09/20 2037 06/10/20 0600  NA 137 133* 139  K 4.1 4.7 3.7  CL 104 100 106  CO2 17* 18* 21*  GLUCOSE 274* 455* 191*  BUN 20 30* 24*  CREATININE 1.35* 1.56* 1.17*  CALCIUM 9.3 9.2 9.1  MG 1.8  --   --   PHOS 3.8  --   --    Liver Function Tests: Recent Labs  Lab 06/09/20 1157 06/09/20 2037 06/10/20 0600  AST 50* 44* 39  ALT 36 31 27  ALKPHOS 200* 211* 204*  BILITOT 2.1* 2.2* 2.4*  PROT 7.3 7.6 7.2  ALBUMIN 2.8* 2.8* 2.4*   Recent Labs  Lab 06/09/20 1157  LIPASE 29   Recent Labs  Lab 06/09/20 2145  AMMONIA 14   CBC: Recent Labs  Lab 06/09/20 1157 06/09/20 2037 06/10/20 0600  WBC 15.5* 16.0* 12.2*  HGB 10.1* 10.1* 12.3  HCT 31.4* 31.0* 37.1  MCV 80.1 79.3* 78.1*  PLT 330 369 286    CBG: Recent Labs  Lab 06/10/20 0207 06/10/20 0341 06/10/20 0457 06/10/20 0602 06/10/20 0722  GLUCAP 304* 275* 200* 168* 179*    Recent Results (from the past 240 hour(s))  Resp Panel by RT-PCR (Flu A&B, Covid) Nasopharyngeal Swab     Status: None   Collection Time: 06/09/20  9:45 PM   Specimen: Nasopharyngeal Swab; Nasopharyngeal(NP) swabs in vial transport medium  Result Value Ref Range Status   SARS Coronavirus 2 by RT PCR NEGATIVE NEGATIVE Final    Comment: (NOTE) SARS-CoV-2 target nucleic acids are NOT DETECTED.  The SARS-CoV-2 RNA is generally detectable in upper respiratory specimens during the acute phase of infection. The lowest concentration of SARS-CoV-2 viral copies this assay can detect is 138 copies/mL. A negative result does not preclude SARS-Cov-2 infection and should not be used as the sole basis for treatment or other patient management decisions. A  negative result may occur with  improper specimen collection/handling, submission of specimen other than nasopharyngeal swab, presence of viral mutation(s) within the areas targeted by this assay, and inadequate number of viral copies(<138 copies/mL). A negative result must be combined with clinical observations, patient history, and epidemiological information. The expected result is Negative.  Fact Sheet for Patients:  EntrepreneurPulse.com.au  Fact Sheet for Healthcare Providers:  IncredibleEmployment.be  This test is no t yet approved or cleared by the [place] FDA and  has been authorized for detection and/or diagnosis of SARS-CoV-2 by FDA under an Emergency Use Authorization (EUA). This EUA will remain  in effect (meaning this test can be used) for the duration of the COVID-19 declaration under Section 564(b)(1) of the Act, 21 U.S.C.section 360bbb-3(b)(1), unless the authorization is terminated  or revoked sooner.       Influenza A by PCR NEGATIVE NEGATIVE Final   Influenza B by PCR NEGATIVE NEGATIVE Final    Comment: (NOTE) The Xpert Xpress SARS-CoV-2/FLU/RSV plus assay is intended as an aid in the diagnosis of influenza from Nasopharyngeal swab specimens and should not be used as a sole basis for treatment. Nasal washings and aspirates are unacceptable for Xpert Xpress SARS-CoV-2/FLU/RSV testing.  Fact Sheet for Patients: EntrepreneurPulse.com.au  Fact Sheet for Healthcare Providers: IncredibleEmployment.be  This test is not yet approved or cleared by the [place] FDA and has been authorized for detection and/or diagnosis of SARS-CoV-2 by FDA under an Emergency Use Authorization (EUA). This EUA will remain in effect (meaning this test can be used) for the duration of the COVID-19 declaration under Section 564(b)(1) of the Act, 21 U.S.C. section 360bbb-3(b)(1), unless the authorization  is terminated or revoked.  Performed at [facility], [address] [place], [place] [address]      Studies: DG Eye Foreign Body  Result Date: 06/10/2020 CLINICAL DATA:  Metal working/exposure; clearance prior to MRI EXAM: ORBITS FOR FOREIGN BODY - 2 VIEW COMPARISON:  None. FINDINGS: There is no evidence of metallic foreign body within the orbits. No significant bone abnormality identified. IMPRESSION: No evidence of metallic foreign body within the orbits. Electronically Signed   By: Ulyses Jarred M.D.   On: 06/10/2020 00:26   DG Chest 2 View  Result Date: 06/09/2020 CLINICAL DATA:  Shortness of breath EXAM: CHEST - 2 VIEW COMPARISON:  05/23/2020 FINDINGS: Heart and mediastinal contours are within normal limits. No focal opacities or effusions. No acute bony abnormality. IMPRESSION: No active cardiopulmonary disease. Electronically Signed   By: Rolm Baptise M.D.  On: 06/09/2020 12:25   DG Pelvis 1-2 Views  Result Date: 06/10/2020 CLINICAL DATA:  MRI clearance EXAM: PELVIS - 1-2 VIEW COMPARISON:  None. FINDINGS: There is no evidence of pelvic fracture or diastasis. No pelvic bone lesions are seen. IMPRESSION: No metallic foreign body. Electronically Signed   By: Ulyses Jarred M.D.   On: 06/10/2020 00:26   DG Abd 1 View  Result Date: 06/10/2020 CLINICAL DATA:  MRI clearance EXAM: ABDOMEN - 1 VIEW COMPARISON:  None. FINDINGS: The bowel gas pattern is normal. No radio-opaque calculi or other significant radiographic abnormality are seen. IMPRESSION: No metallic foreign body. Electronically Signed   By: Ulyses Jarred M.D.   On: 06/10/2020 00:27   CT Head Wo Contrast  Result Date: 06/09/2020 CLINICAL DATA:  Delirium EXAM: CT HEAD WITHOUT CONTRAST TECHNIQUE: Contiguous axial images were obtained from the base of the skull through the vertex without intravenous contrast. COMPARISON:  None. FINDINGS: Brain: No evidence of large-territorial acute infarction. No parenchymal hemorrhage.  No mass lesion. No extra-axial collection. No mass effect or midline shift. No hydrocephalus. Basilar cisterns are patent. Vascular: No hyperdense vessel. Skull: No acute fracture or focal lesion. Sinuses/Orbits: Paranasal sinuses and mastoid air cells are clear. The orbits are unremarkable. Other: None. IMPRESSION: No acute intracranial abnormality. Electronically Signed   By: Iven Finn M.D.   On: 06/09/2020 22:19   MR BRAIN WO CONTRAST  Result Date: 06/10/2020 CLINICAL DATA:  Altered mental status. Possible drug overdose. Seroquel. EXAM: MRI HEAD WITHOUT CONTRAST TECHNIQUE: Multiplanar, multiecho pulse sequences of the brain and surrounding structures were obtained without intravenous contrast. COMPARISON:  None. FINDINGS: Brain: No acute infarct, mass effect or extra-axial collection. No acute or chronic hemorrhage. Normal white matter signal, parenchymal volume and CSF spaces. The midline structures are normal. Vascular: Major flow voids are preserved. Skull and upper cervical spine: Normal calvarium and skull base. Visualized upper cervical spine and soft tissues are normal. Sinuses/Orbits:No paranasal sinus fluid levels or advanced mucosal thickening. No mastoid or middle ear effusion. Normal orbits. IMPRESSION: Normal brain MRI. Electronically Signed   By: Ulyses Jarred M.D.   On: 06/10/2020 03:42    Scheduled Meds: . levothyroxine  150 mcg Oral Q0600  . sodium chloride flush  3 mL Intravenous Q12H   Continuous Infusions: . cefTRIAXone (ROCEPHIN)  IV    . dextrose 5% lactated ringers 125 mL/hr at 06/10/20 0500  . insulin 6 Units/hr (06/10/20 0724)  . lactated ringers Stopped (06/10/20 0459)    Assessment/Plan:  1. Suspected severe sepsis, present on admission, suspected meningitis with acute metabolic encephalopathy, unable to speak and fever of 100.9, tachycardia, tachypnea and leukocytosis.  Patient also has acute kidney injury.  Patient has neck rigidity.  Case discussed with  neurology and interventional radiology and the plan is to get a lumbar puncture.  Start empiric antibiotics with Rocephin 2 g IV every 12, vancomycin and acyclovir.  Put on airborne precautions for now.  Trying to hold off on steroids secondary to DKA. 2. Diabetic ketoacidosis was on insulin drip this morning we will try to convert over to long-acting insulin and short acting insulin. 3. Acute kidney injury with creatinine up at 1.56 and with IV fluids down to 1.17. 4. Elevated liver function tests with total bilirubin and alkaline phosphatase.  We will get lumbar puncture first then likely imaging of the right upper quadrant later on today or tomorrow. 5. Hypothyroidism unspecified on levothyroxine 6. Obesity with a BMI of 33.23 7. History of  CLL        Code Status:     Code Status Orders  (From admission, onward)         Start     Ordered   06/09/20 2313  Full code  Continuous        06/09/20 2314        Code Status History    This patient has a current code status but no historical code status.   Advance Care Planning Activity     Family Communication: Significant other at the bedside Disposition Plan: Status is: Inpatient  Dispo: The patient is from: Home              Anticipated d/c is to: Home              Anticipated d/c date is: Likely will need for 5 days here in the hospital              Patient currently being treated for suspected severe sepsis and suspected meningitis   Difficult to place patient.  Hopefully not.  Consultants:  Neurology  Antibiotics:  Rocephin  Vancomycin  Acyclovir  Time spent: 35 minutes  [place]

## 2020-06-10 NOTE — Progress Notes (Signed)
Patient ID: Tonya Myers, female   DOB: 1966/03/26, 55 y.o.   MRN: [id number]  Gram stain showing gm positive diplococci.  Case discussed with ID and switch abx back to rocephin and keep vancomycin, get rid of other abx.  Hold off on steroids at this point, as per ID benefit only if given prior to abx given.  Since Patient admitted with DKA and was on insulin drip this am hesitant on steroids.  Dr Leslye Peer

## 2020-06-10 NOTE — ED Notes (Signed)
This RN and Anda Kraft, RN at bedside. Pt cleaned and new chux, brief and linens in place. Rectal temp rechecked

## 2020-06-11 ENCOUNTER — Inpatient Hospital Stay: Payer: Medicare Other

## 2020-06-11 ENCOUNTER — Inpatient Hospital Stay (HOSPITAL_COMMUNITY)
Admit: 2020-06-11 | Discharge: 2020-06-11 | Disposition: A | Discharge status: Home / Self Care | Payer: Medicare Other | Attending: Infectious Diseases | Admitting: Infectious Diseases

## 2020-06-11 DIAGNOSIS — N179 Acute kidney failure, unspecified: Secondary | ICD-10-CM | POA: Diagnosis not present

## 2020-06-11 DIAGNOSIS — M4620 Osteomyelitis of vertebra, site unspecified: Secondary | ICD-10-CM | POA: Diagnosis not present

## 2020-06-11 DIAGNOSIS — B953 Streptococcus pneumoniae as the cause of diseases classified elsewhere: Secondary | ICD-10-CM | POA: Diagnosis not present

## 2020-06-11 DIAGNOSIS — G9341 Metabolic encephalopathy: Secondary | ICD-10-CM | POA: Diagnosis not present

## 2020-06-11 DIAGNOSIS — G001 Pneumococcal meningitis: Secondary | ICD-10-CM | POA: Diagnosis not present

## 2020-06-11 DIAGNOSIS — G009 Bacterial meningitis, unspecified: Secondary | ICD-10-CM | POA: Diagnosis not present

## 2020-06-11 DIAGNOSIS — A419 Sepsis, unspecified organism: Secondary | ICD-10-CM | POA: Diagnosis not present

## 2020-06-11 DIAGNOSIS — R7881 Bacteremia: Secondary | ICD-10-CM

## 2020-06-11 DIAGNOSIS — G9340 Encephalopathy, unspecified: Secondary | ICD-10-CM | POA: Diagnosis not present

## 2020-06-11 LAB — BASIC METABOLIC PANEL
Anion gap: 11 (ref 5–15)
BUN: 20 mg/dL (ref 6–20)
CO2: 21 mmol/L — ABNORMAL LOW (ref 22–32)
Calcium: 8.9 mg/dL (ref 8.9–10.3)
Chloride: 113 mmol/L — ABNORMAL HIGH (ref 98–111)
Creatinine, Ser: 1.01 mg/dL — ABNORMAL HIGH (ref 0.44–1.00)
GFR, Estimated: >60 mL/min (ref >60)
Glucose, Bld: 308 mg/dL — ABNORMAL HIGH (ref 70–99)
Potassium: 3.7 mmol/L (ref 3.5–5.1)
Sodium: 145 mmol/L (ref 135–145)

## 2020-06-11 LAB — CBC
HCT: 31.9 % — ABNORMAL LOW (ref 36.0–46.0)
Hemoglobin: 10.8 g/dL — ABNORMAL LOW (ref 12.0–15.0)
MCH: 26.1 pg (ref 26.0–34.0)
MCHC: 33.9 g/dL (ref 30.0–36.0)
MCV: 77.1 fL — ABNORMAL LOW (ref 80.0–100.0)
Platelets: 272 10*3/uL (ref 150–400)
RBC: 4.14 MIL/uL (ref 3.87–5.11)
RDW: 15.9 % — ABNORMAL HIGH (ref 11.5–15.5)
WBC: 12.5 10*3/uL — ABNORMAL HIGH (ref 4.0–10.5)
nRBC: 0 % (ref 0.0–0.2)

## 2020-06-11 LAB — GLUCOSE, CAPILLARY
Glucose-Capillary: 178 mg/dL — ABNORMAL HIGH (ref 70–99)
Glucose-Capillary: 186 mg/dL — ABNORMAL HIGH (ref 70–99)
Glucose-Capillary: 209 mg/dL — ABNORMAL HIGH (ref 70–99)
Glucose-Capillary: 241 mg/dL — ABNORMAL HIGH (ref 70–99)
Glucose-Capillary: 263 mg/dL — ABNORMAL HIGH (ref 70–99)
Glucose-Capillary: 267 mg/dL — ABNORMAL HIGH (ref 70–99)
Glucose-Capillary: 279 mg/dL — ABNORMAL HIGH (ref 70–99)

## 2020-06-11 LAB — LACTIC ACID, PLASMA
Lactic Acid, Venous: 2.4 mmol/L (ref 0.5–1.9)
Lactic Acid, Venous: 1.5 mmol/L (ref 0.5–1.9)

## 2020-06-11 LAB — STREP PNEUMONIAE URINARY ANTIGEN: Strep Pneumo Urinary Antigen: POSITIVE — AB

## 2020-06-11 LAB — BLOOD GAS, VENOUS
Acid-base deficit: 2 mmol/L (ref 0.0–2.0)
Bicarbonate: 22.2 mmol/L (ref 20.0–28.0)
O2 Saturation: 64.3 %
Patient temperature: 37
pCO2, Ven: 35 mmHg — ABNORMAL LOW (ref 44.0–60.0)
pH, Ven: 7.41 (ref 7.250–7.430)
pO2, Ven: 33 mmHg (ref 32.0–45.0)

## 2020-06-11 LAB — CBG MONITORING, ED: Glucose-Capillary: 271 mg/dL — ABNORMAL HIGH (ref 70–99)

## 2020-06-11 LAB — MRSA PCR SCREENING: MRSA by PCR: NEGATIVE

## 2020-06-11 LAB — RPR: RPR Ser Ql: NONREACTIVE

## 2020-06-11 LAB — HIV ANTIBODY (ROUTINE TESTING W REFLEX): HIV Screen 4th Generation wRfx: NONREACTIVE

## 2020-06-11 IMAGING — MR MR CERVICAL SPINE W/O CM
5 series · 34 of 48 positions shown · non-contrast
Comparison: None

CLINICAL DATA: Pneumococcal meningitis. Neck and back pain.
Evaluate for epidural abscess.

EXAM:
MRI CERVICAL, THORACIC AND LUMBAR SPINE WITHOUT CONTRAST
TECHNIQUE: Multiplanar and multiecho pulse sequences of the cervical spine, to
include the craniocervical junction and cervicothoracic junction,
and thoracic and lumbar spine, were obtained without intravenous
contrast.

[Series 1: T2 · sagittal · 3.0mm · 0.62mm/px · 6 of 15 slices shown (1 of 2)]
[im 1/15]
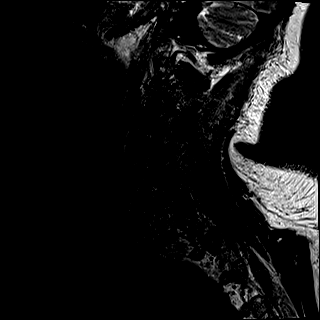
[im 3/15]
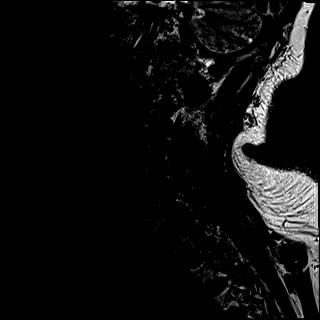
[im 6/15]
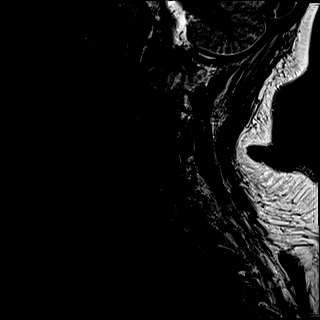
[im 9/15]
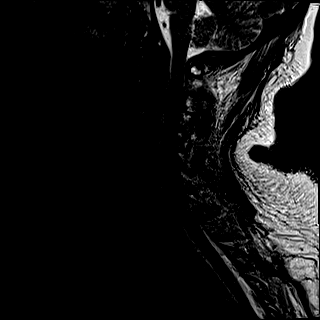
[im 12/15]
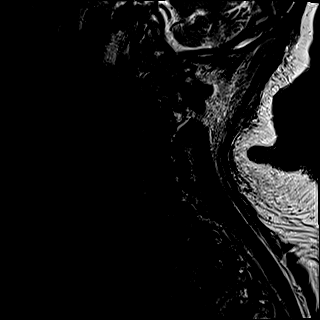
[im 15/15]
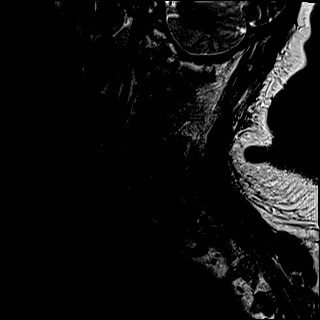

[Series 2: STIR · sagittal · 3.0mm · 0.62mm/px · 7 of 15 slices shown]
[im 1/15]
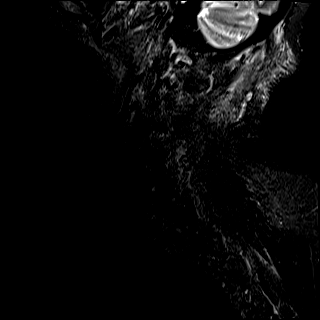
[im 3/15]
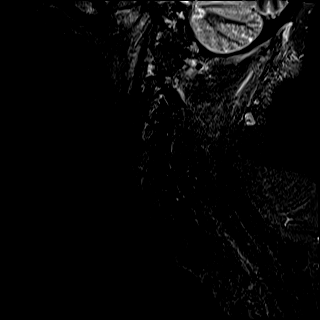
[im 5/15]
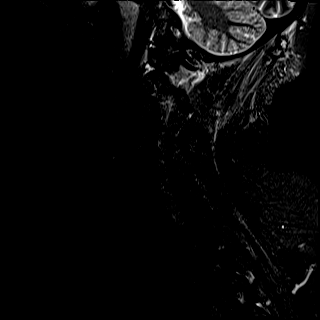
[im 8/15]
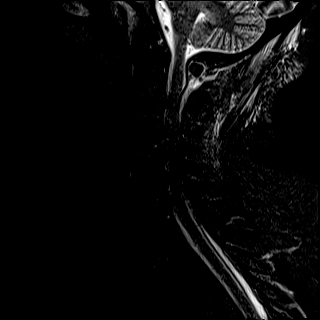
[im 10/15]
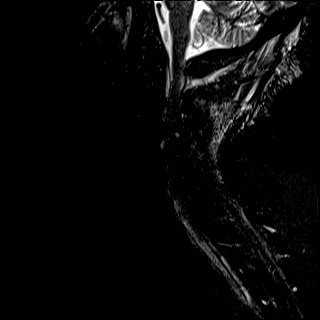
[im 12/15]
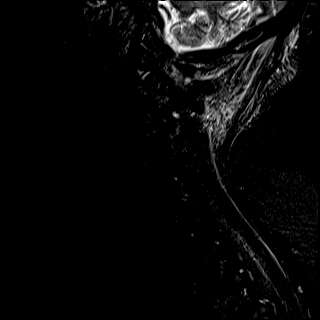
[im 15/15]
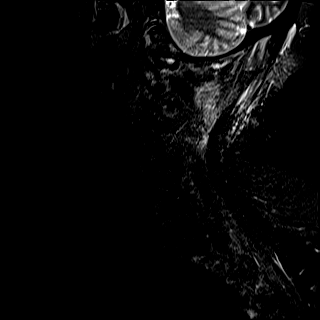

[Series 3: FLAIR · sagittal · 3.0mm · 0.78mm/px · 7 of 15 slices shown]
[im 1/15]
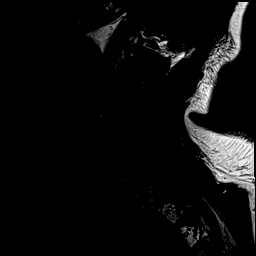
[im 3/15]
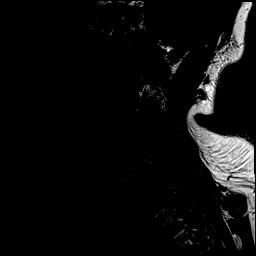
[im 5/15]
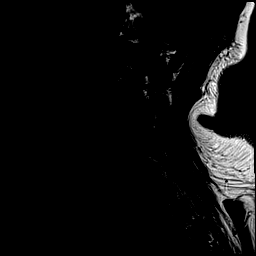
[im 8/15]
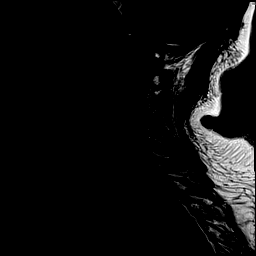
[im 10/15]
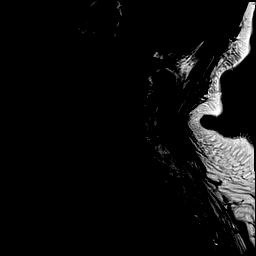
[im 12/15]
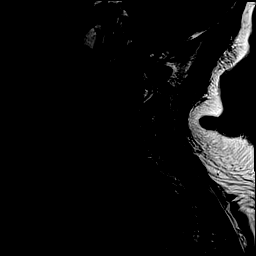
[im 15/15]
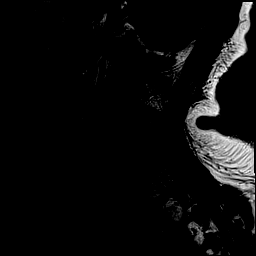

[Series 4: T2 · axial · 3.0mm · 0.70mm/px · z∈[-44,+53]mm · 8 of 29 slices shown (2 of 2)]
[im 1/29]
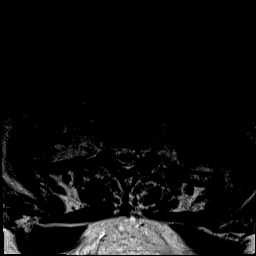
[im 5/29]
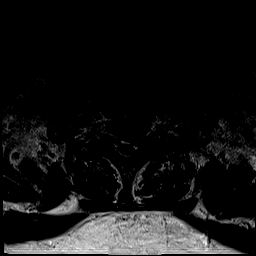
[im 9/29]
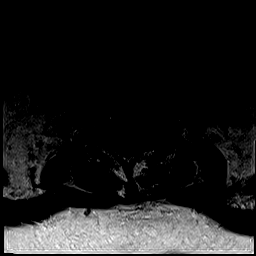
[im 13/29]
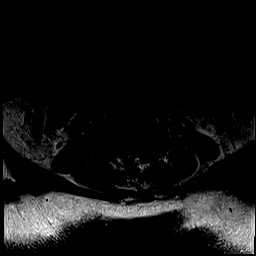
[im 16/29]
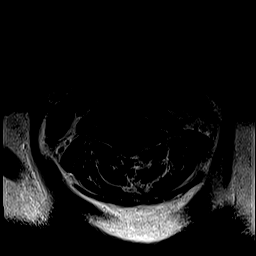
[im 20/29]
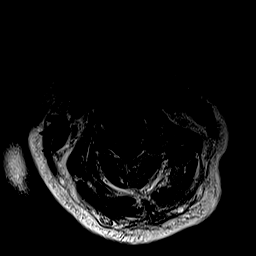
[im 24/29]
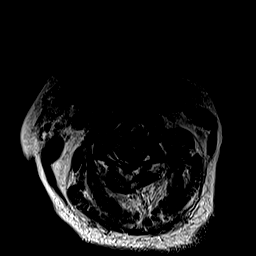
[im 29/29]
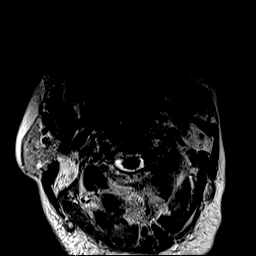

[Series 5: csp ax mpgr · axial · 3.0mm · 0.35mm/px · z∈[-44,+22]mm · 6 of 29 slices shown]
[im 1/29]
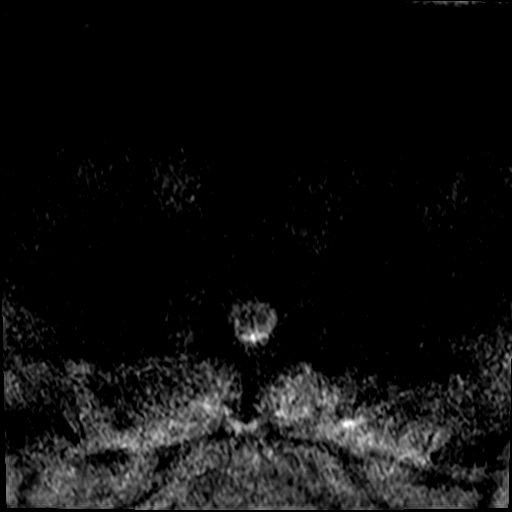
[im 5/29]
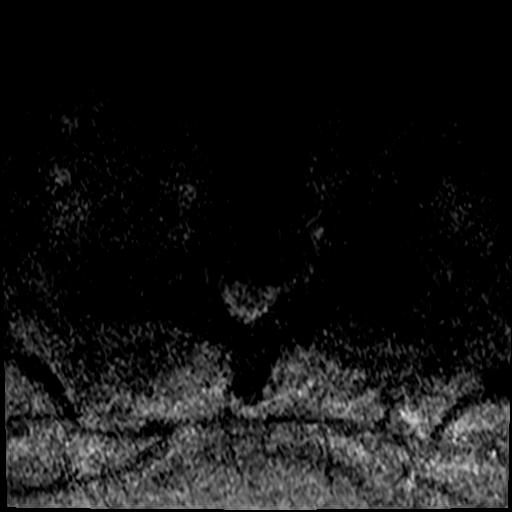
[im 9/29]
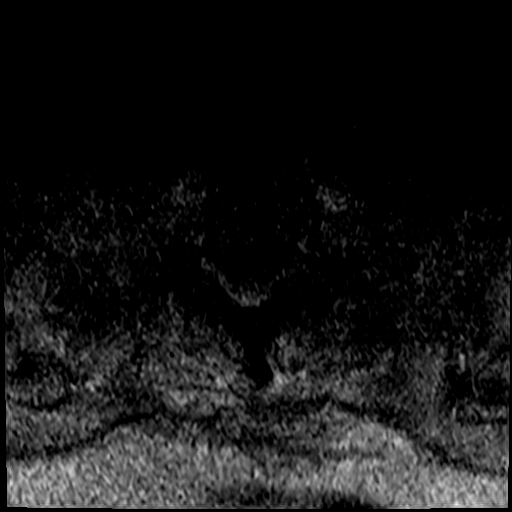
[im 13/29]
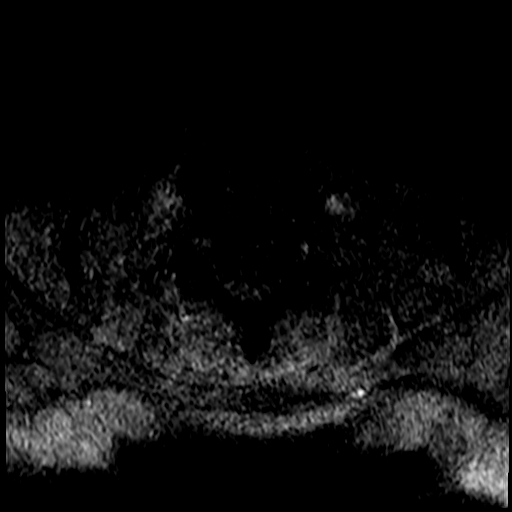
[im 16/29]
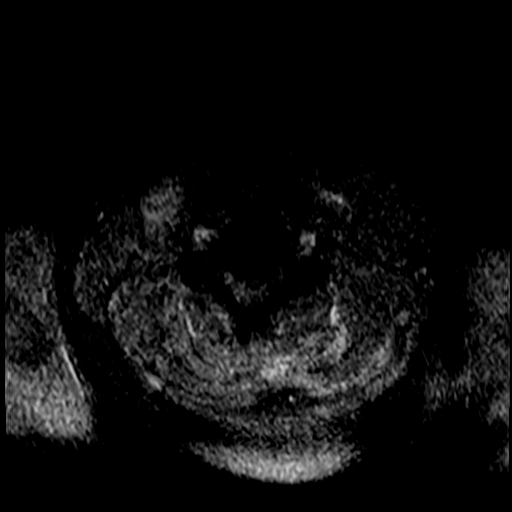
[im 20/29]
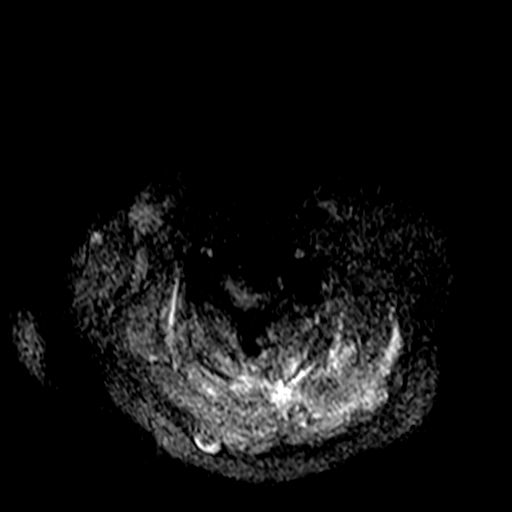

[34 of 48 positions shown; findings below may reference images not displayed]

FINDINGS: MRI CERVICAL SPINE FINDINGS

Limited examination due to significant patient motion.

Alignment: Normal alignment.

Vertebrae: Grossly normal marrow signal. No bone lesions or
fractures. No findings suspicious for discitis or osteomyelitis.

Cord: Grossly normal cord signal intensity. No cord lesion or
syrinx.

Posterior Fossa, vertebral arteries, paraspinal tissues: No
significant findings.

Disc levels:

C2-3: Shallow broad-based disc protrusion with mass effect on the
ventral thecal sac. No significant foraminal stenosis.

C3-4: Large central and left paracentral disc protrusion with mass
effect on the thecal sac and flattening of the cervical cord. Mild
foraminal stenosis bilaterally also.

C4-5: Bulging degenerated annulus, osteophytic ridging and left
paracentral disc protrusion with mass effect on the thecal sac.
There is also mild bilateral foraminal stenosis.

C5-6: Degenerated bulging annulus, central disc protrusion and facet
disease with mild bilateral foraminal stenosis.

C6-7: Bulging degenerated annulus and osteophytic ridging with
flattening of the ventral thecal sac asymmetric left. Mild left
foraminal stenosis.

C7-T1: Bulging degenerated annulus and left paracentral disc
protrusion with mild mass effect on the left side of thecal sac. No
significant foraminal stenosis.

MRI THORACIC SPINE FINDINGS

Alignment:  Normal alignment of the thoracic vertebral bodies.

Vertebrae: Normal marrow signal. Small scattered hemangiomas but no
worrisome bone lesions or fractures. No findings suspicious for
discitis or osteomyelitis. The facets are normally aligned. No facet
or laminar fractures. No evidence of facet septic arthritis.

Cord: Grossly normal thoracic spinal cord. No obvious cord lesions
or syrinx.

Paraspinal and other soft tissues: No significant paraspinal
findings.

Disc levels:

Small central disc protrusion at T6-7.

MRI LUMBAR SPINE FINDINGS

Segmentation: There are five lumbar type vertebral bodies. The last
full intervertebral disc space is labeled L5-S1.

Alignment:  Normal

Vertebrae: No bone lesions or fractures. No findings suspicious for
septic arthritis or osteomyelitis. Endplate reactive changes at
L5-S1.

Significant facet arthropathy noted on the left side at L2-3 and
L3-4 with findings consistent with septic arthritis. Associated
abscesses in the paraspinal muscles associated with both these
levels. There is also significant surrounding myositis.

Findings suspicious for a small epidural abscess on the left side at
C2-3 associated with the infected left facet joint. No significant
mass effect on the thecal sac.

Conus medullaris and cauda equina: Conus extends to the L1-2 level.
Conus and cauda equina appear normal.

Paraspinal and other soft tissues: Abscesses in the left paraspinal
muscles at L2, L3 and L4.

Disc levels:

L1-2: No significant findings.

L2-3: No findings for discitis or osteomyelitis. Small posterior
epidural abscess on the left side associated with the septic
arthritis in the left L2-3 facet joint. No disc protrusions, spinal
or foraminal stenosis.

L3-4: No disc protrusions, spinal or foraminal stenosis. Septic
arthritis involving the left L3-4 facet joint with an adjacent soft
tissue abscess measuring approximately 18 x 15 mm. Smaller adjacent
abscesses are also noted in the left paraspinal muscle.

L4-5: No evidence of discitis or osteomyelitis. There is a shallow
central disc protrusion with mass effect on the ventral thecal sac
and mild bilateral lateral recess stenosis. No foraminal stenosis.
The facet joints are intact at this level. There are some small
abscesses in the left paraspinal muscle extending down just below
the L4-5 disc space.

L5-S1: Degenerative disc disease with endplate reactive changes.
There is a shallow broad-based disc protrusion asymmetric left with
mass effect on the thecal sac and on the left S1 nerve root. No
foraminal stenosis. No septic arthritis involving the facet joints.
IMPRESSION: 1. MR findings consistent with septic arthritis involving the left
L2-3 and L3-4 facet joints with associated abscess is in the left
paraspinal muscles.
2. Suspect small epidural abscess posteriorly at L2-3 on the left
side.
3. No definite MR findings for discitis or osteomyelitis in the
cervical, thoracic or lumbar spines.
4. Degenerative cervical spondylosis with multilevel disc disease
and facet disease. Multilevel disc protrusions most significant at
C3-4.

## 2020-06-11 IMAGING — MR MR LUMBAR SPINE W/O CM
5 series · 31 of 48 positions shown · non-contrast
Comparison: None

CLINICAL DATA: Pneumococcal meningitis. Neck and back pain.
Evaluate for epidural abscess.

EXAM:
MRI CERVICAL, THORACIC AND LUMBAR SPINE WITHOUT CONTRAST
TECHNIQUE: Multiplanar and multiecho pulse sequences of the cervical spine, to
include the craniocervical junction and cervicothoracic junction,
and thoracic and lumbar spine, were obtained without intravenous
contrast.

[Series 1: T2 · sagittal · 4.0mm · 1.02mm/px · 6 of 16 slices shown (1 of 2)]
[im 1/16]
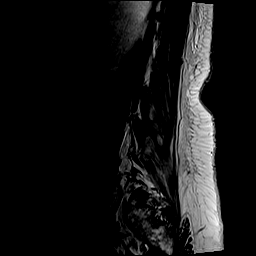
[im 4/16]
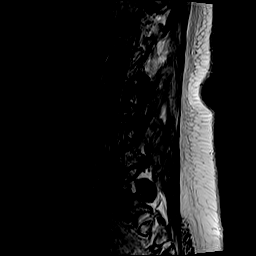
[im 7/16]
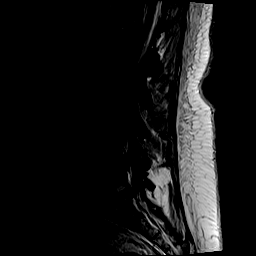
[im 10/16]
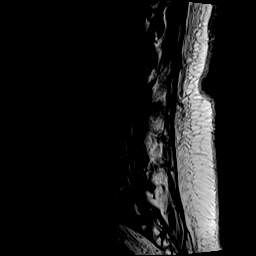
[im 13/16]
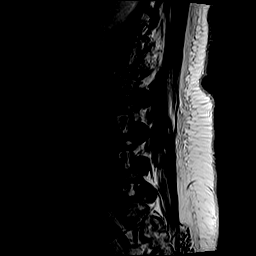
[im 16/16]
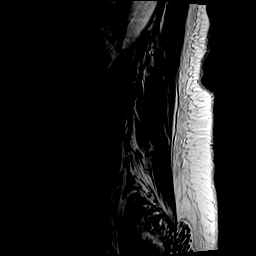

[Series 2: STIR · sagittal · 4.0mm · 0.51mm/px · 2 of 16 slices shown]
[im 1/16]
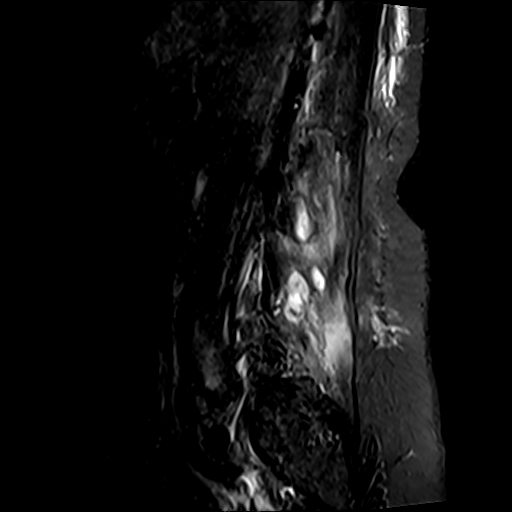
[im 3/16]
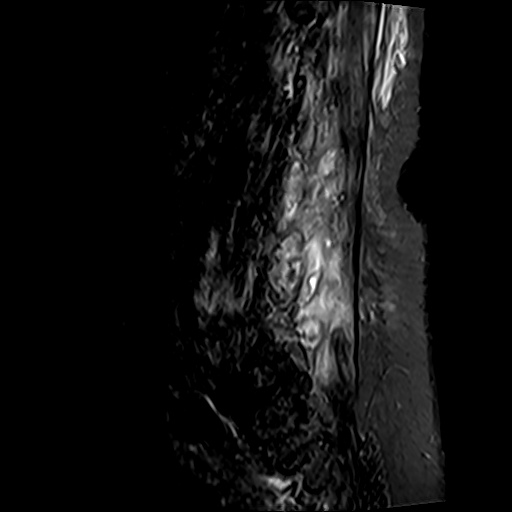

[Series 3: T1 · sagittal · 4.0mm · 1.02mm/px · 7 of 16 slices shown (1 of 2)]
[im 1/16]
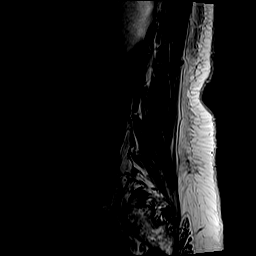
[im 3/16]
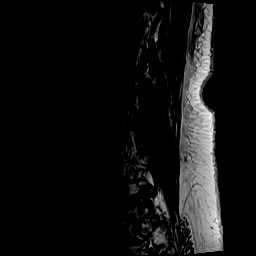
[im 6/16]
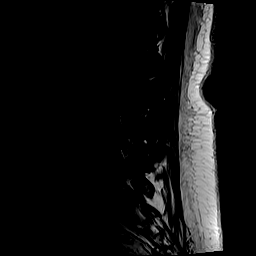
[im 8/16]
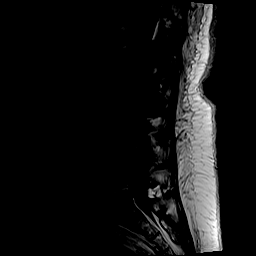
[im 11/16]
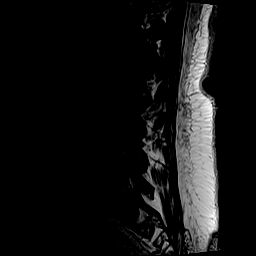
[im 13/16]
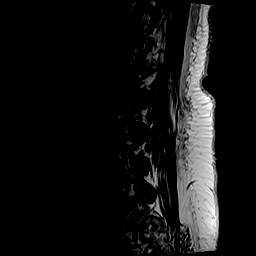
[im 16/16]
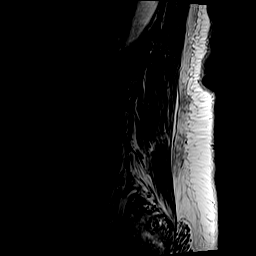

[Series 4: T2 · axial · 4.0mm · 0.78mm/px · z∈[-512,-336]mm · 8 of 32 slices shown (2 of 2)]
[im 1/32]
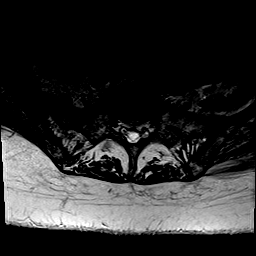
[im 5/32]
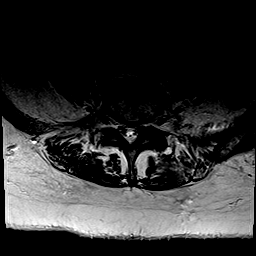
[im 10/32]
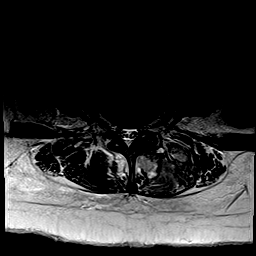
[im 15/32]
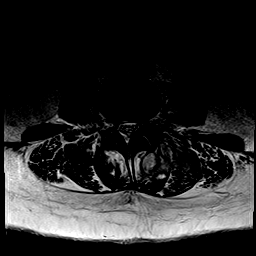
[im 17/32]
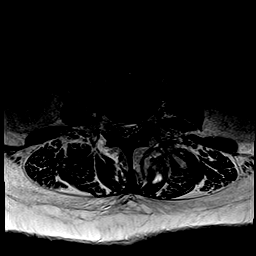
[im 22/32]
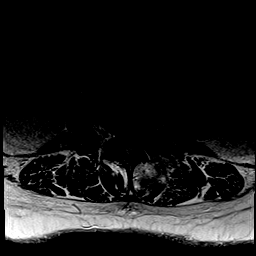
[im 27/32]
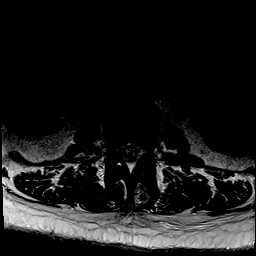
[im 32/32]
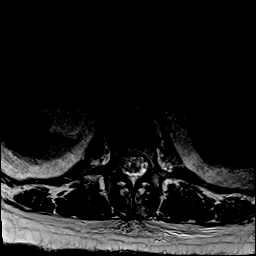

[Series 5: T1 · axial · 4.0mm · 0.39mm/px · z∈[-512,-336]mm · 8 of 32 slices shown (2 of 2)]
[im 1/32]
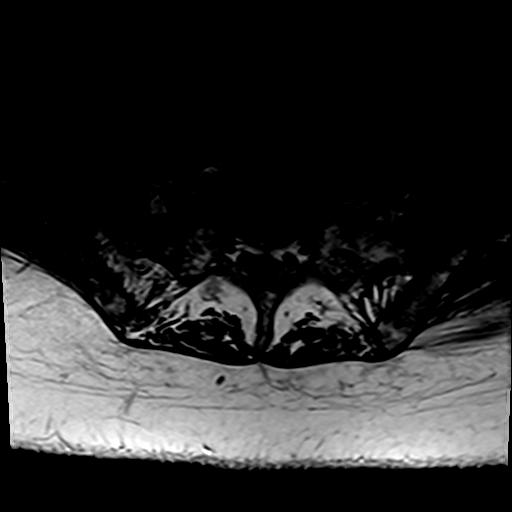
[im 5/32]
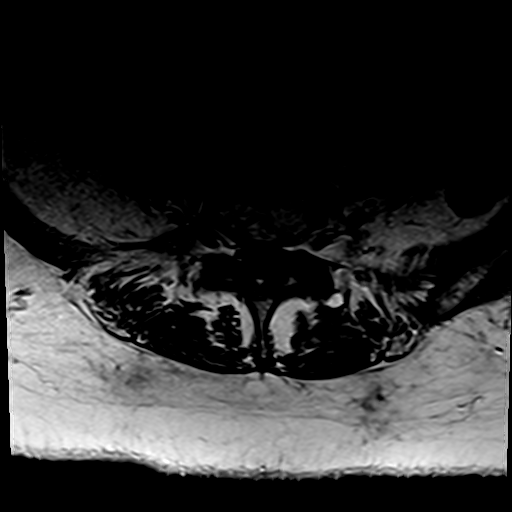
[im 10/32]
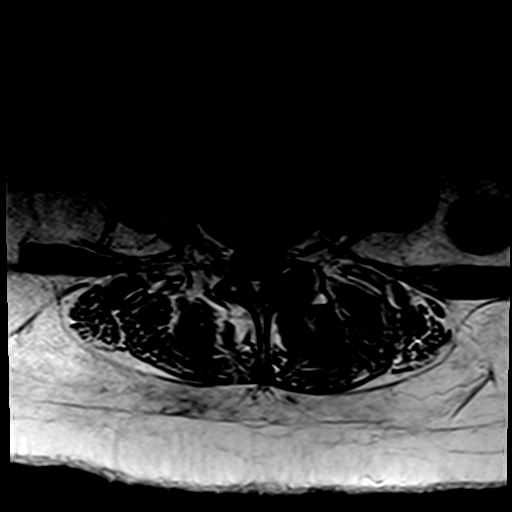
[im 15/32]
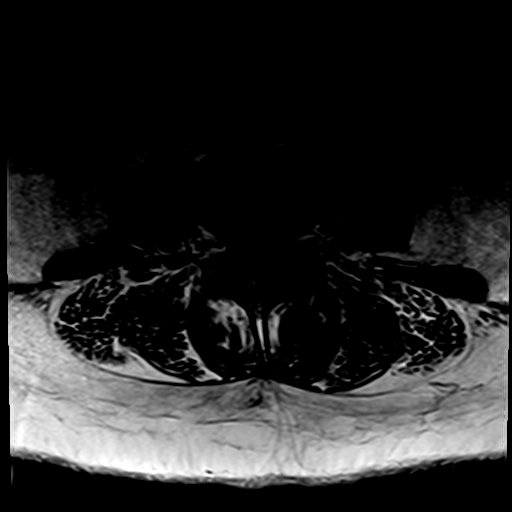
[im 17/32]
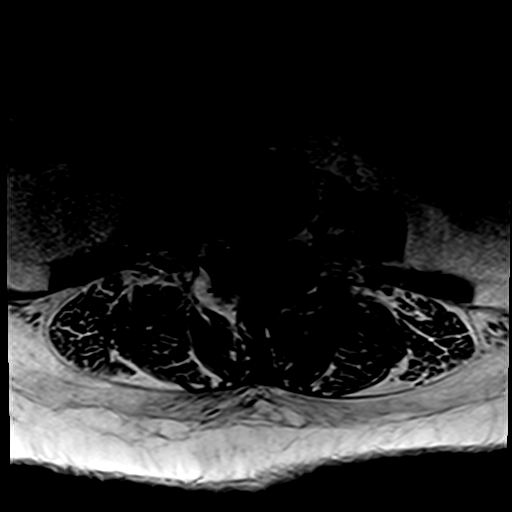
[im 22/32]
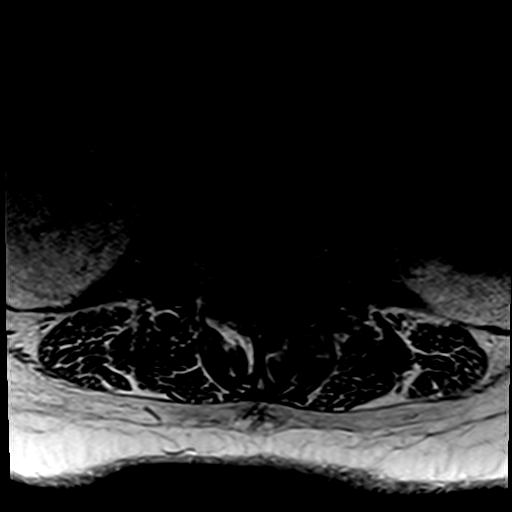
[im 27/32]
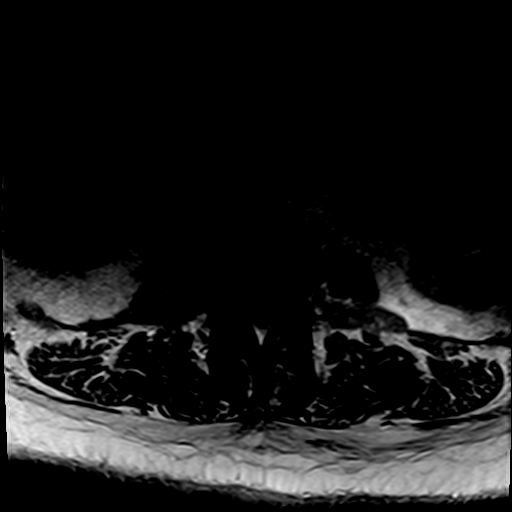
[im 32/32]
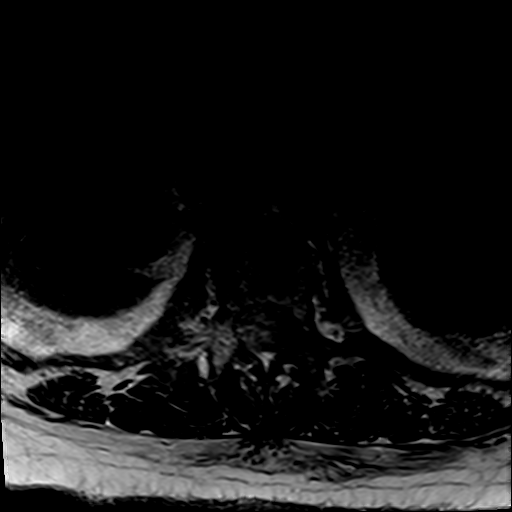

[31 of 48 positions shown; findings below may reference images not displayed]

FINDINGS: MRI CERVICAL SPINE FINDINGS

Limited examination due to significant patient motion.

Alignment: Normal alignment.

Vertebrae: Grossly normal marrow signal. No bone lesions or
fractures. No findings suspicious for discitis or osteomyelitis.

Cord: Grossly normal cord signal intensity. No cord lesion or
syrinx.

Posterior Fossa, vertebral arteries, paraspinal tissues: No
significant findings.

Disc levels:

C2-3: Shallow broad-based disc protrusion with mass effect on the
ventral thecal sac. No significant foraminal stenosis.

C3-4: Large central and left paracentral disc protrusion with mass
effect on the thecal sac and flattening of the cervical cord. Mild
foraminal stenosis bilaterally also.

C4-5: Bulging degenerated annulus, osteophytic ridging and left
paracentral disc protrusion with mass effect on the thecal sac.
There is also mild bilateral foraminal stenosis.

C5-6: Degenerated bulging annulus, central disc protrusion and facet
disease with mild bilateral foraminal stenosis.

C6-7: Bulging degenerated annulus and osteophytic ridging with
flattening of the ventral thecal sac asymmetric left. Mild left
foraminal stenosis.

C7-T1: Bulging degenerated annulus and left paracentral disc
protrusion with mild mass effect on the left side of thecal sac. No
significant foraminal stenosis.

MRI THORACIC SPINE FINDINGS

Alignment:  Normal alignment of the thoracic vertebral bodies.

Vertebrae: Normal marrow signal. Small scattered hemangiomas but no
worrisome bone lesions or fractures. No findings suspicious for
discitis or osteomyelitis. The facets are normally aligned. No facet
or laminar fractures. No evidence of facet septic arthritis.

Cord: Grossly normal thoracic spinal cord. No obvious cord lesions
or syrinx.

Paraspinal and other soft tissues: No significant paraspinal
findings.

Disc levels:

Small central disc protrusion at T6-7.

MRI LUMBAR SPINE FINDINGS

Segmentation: There are five lumbar type vertebral bodies. The last
full intervertebral disc space is labeled L5-S1.

Alignment:  Normal

Vertebrae: No bone lesions or fractures. No findings suspicious for
septic arthritis or osteomyelitis. Endplate reactive changes at
L5-S1.

Significant facet arthropathy noted on the left side at L2-3 and
L3-4 with findings consistent with septic arthritis. Associated
abscesses in the paraspinal muscles associated with both these
levels. There is also significant surrounding myositis.

Findings suspicious for a small epidural abscess on the left side at
C2-3 associated with the infected left facet joint. No significant
mass effect on the thecal sac.

Conus medullaris and cauda equina: Conus extends to the L1-2 level.
Conus and cauda equina appear normal.

Paraspinal and other soft tissues: Abscesses in the left paraspinal
muscles at L2, L3 and L4.

Disc levels:

L1-2: No significant findings.

L2-3: No findings for discitis or osteomyelitis. Small posterior
epidural abscess on the left side associated with the septic
arthritis in the left L2-3 facet joint. No disc protrusions, spinal
or foraminal stenosis.

L3-4: No disc protrusions, spinal or foraminal stenosis. Septic
arthritis involving the left L3-4 facet joint with an adjacent soft
tissue abscess measuring approximately 18 x 15 mm. Smaller adjacent
abscesses are also noted in the left paraspinal muscle.

L4-5: No evidence of discitis or osteomyelitis. There is a shallow
central disc protrusion with mass effect on the ventral thecal sac
and mild bilateral lateral recess stenosis. No foraminal stenosis.
The facet joints are intact at this level. There are some small
abscesses in the left paraspinal muscle extending down just below
the L4-5 disc space.

L5-S1: Degenerative disc disease with endplate reactive changes.
There is a shallow broad-based disc protrusion asymmetric left with
mass effect on the thecal sac and on the left S1 nerve root. No
foraminal stenosis. No septic arthritis involving the facet joints.
IMPRESSION: 1. MR findings consistent with septic arthritis involving the left
L2-3 and L3-4 facet joints with associated abscess is in the left
paraspinal muscles.
2. Suspect small epidural abscess posteriorly at L2-3 on the left
side.
3. No definite MR findings for discitis or osteomyelitis in the
cervical, thoracic or lumbar spines.
4. Degenerative cervical spondylosis with multilevel disc disease
and facet disease. Multilevel disc protrusions most significant at
C3-4.

## 2020-06-11 IMAGING — CT CT HEAD W/O CM
3 series · 16 of 47 positions shown, 19 images · non-contrast
Comparison: Head CT [DATE].  MRI [DATE].

CLINICAL DATA: Sepsis.  Meningitis hydrocephalus.

EXAM:
CT HEAD WITHOUT CONTRAST
TECHNIQUE: Contiguous axial images were obtained from the base of the skull
through the vertex without intravenous contrast.

[Series 3: head wo · axial · 0.41mm/px · z∈[-72,+53]mm · 10 of 31 slices shown, 13 images]
[im 3/31  brain]
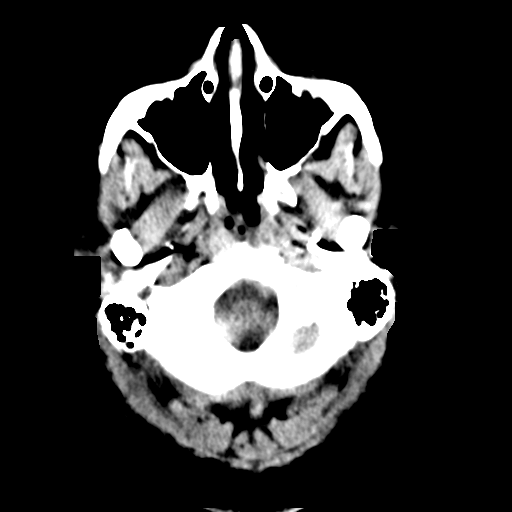
[im 3/31  bone]
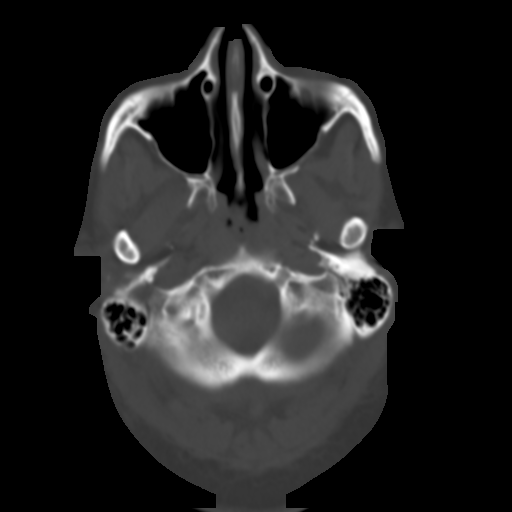
[im 6/31  brain]
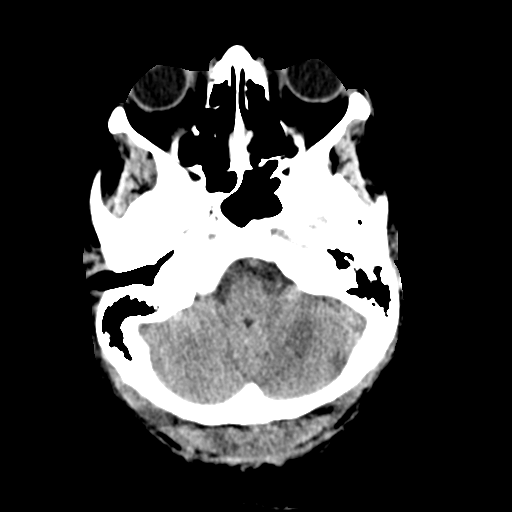
[im 9/31  brain]
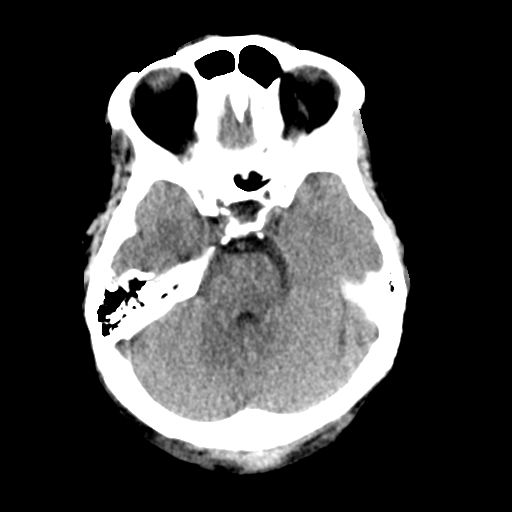
[im 11/31  brain]
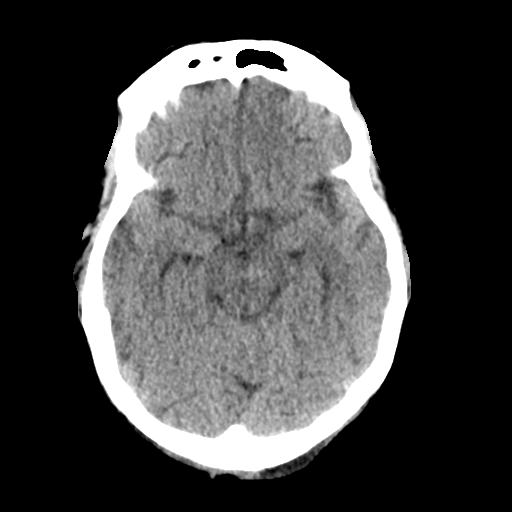
[im 14/31  brain]
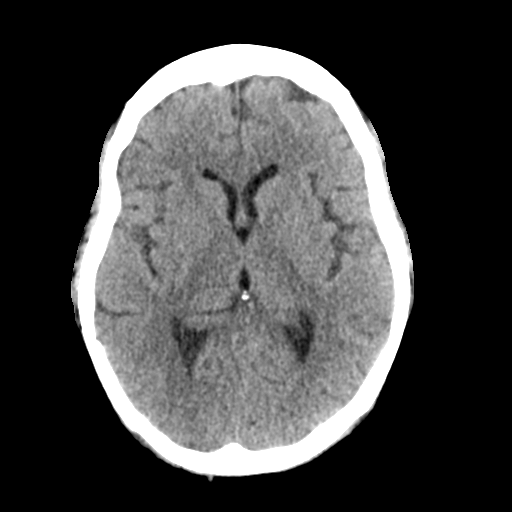
[im 14/31  bone]
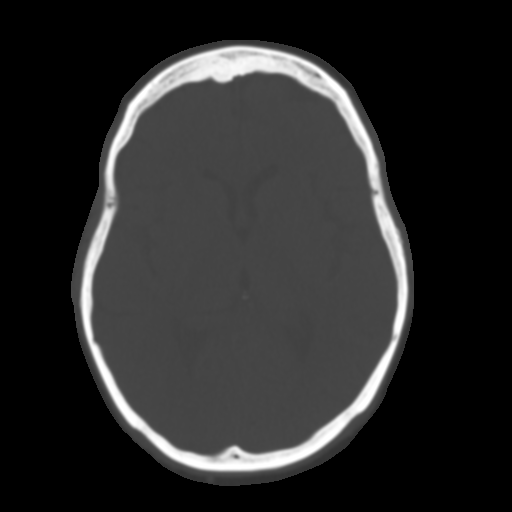
[im 17/31  brain]
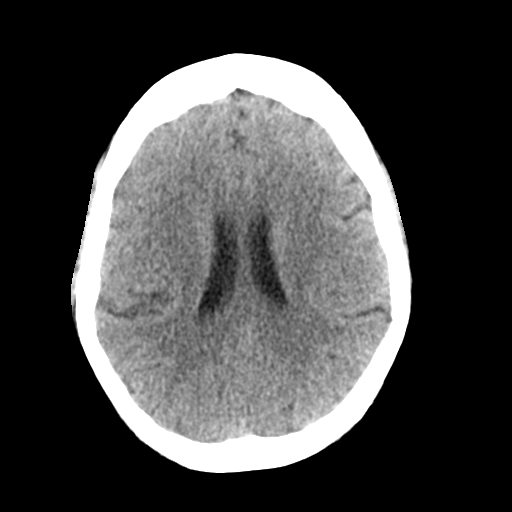
[im 20/31  brain]
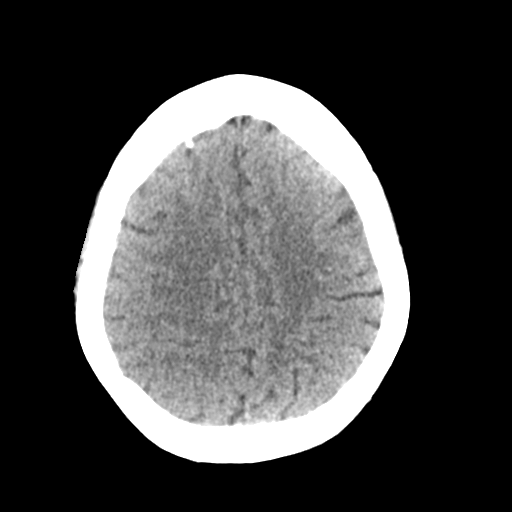
[im 23/31  brain]
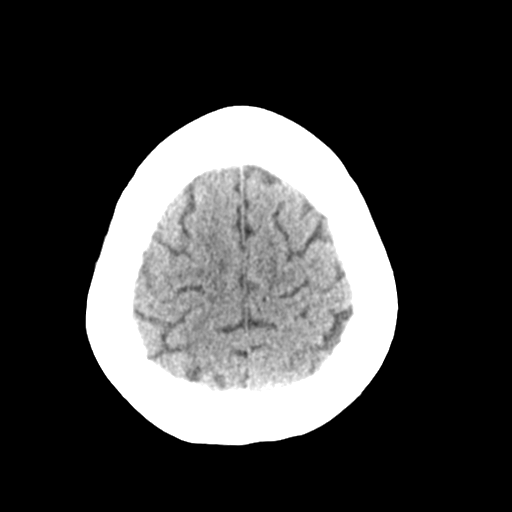
[im 25/31  brain]
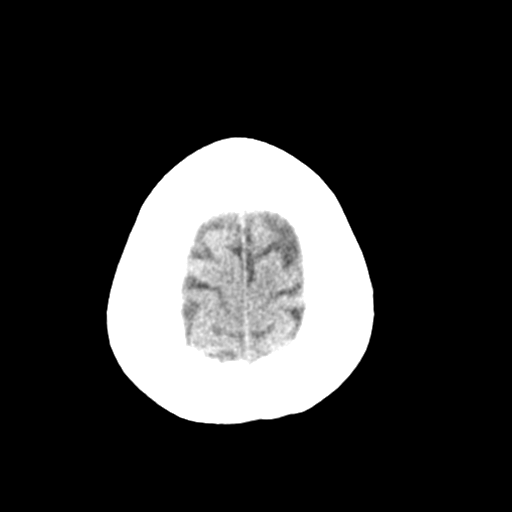
[im 25/31  bone]
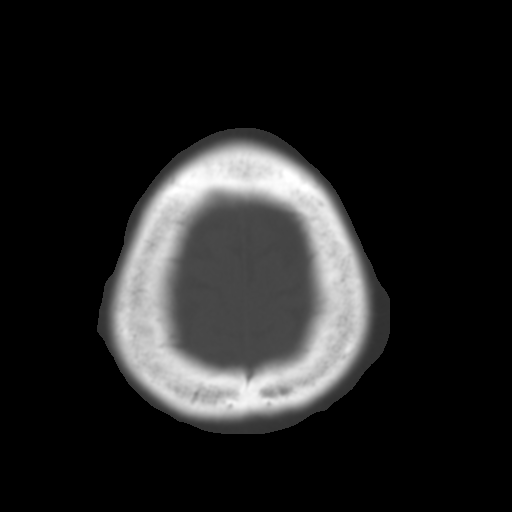
[im 28/31  brain]
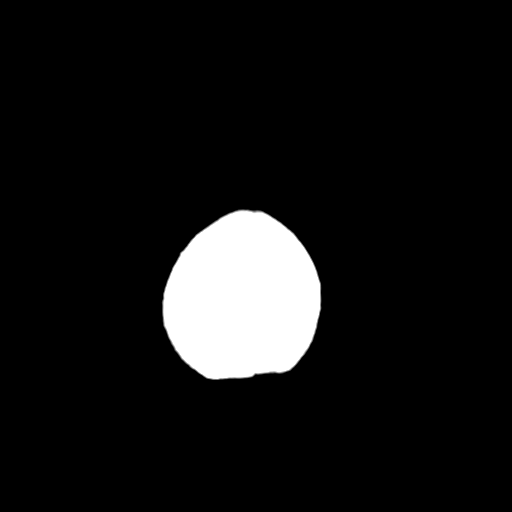

[Series 4: coronal soft tissue · coronal · 0.31mm/px · 3 of 69 slices shown]
[im 23/69  brain]
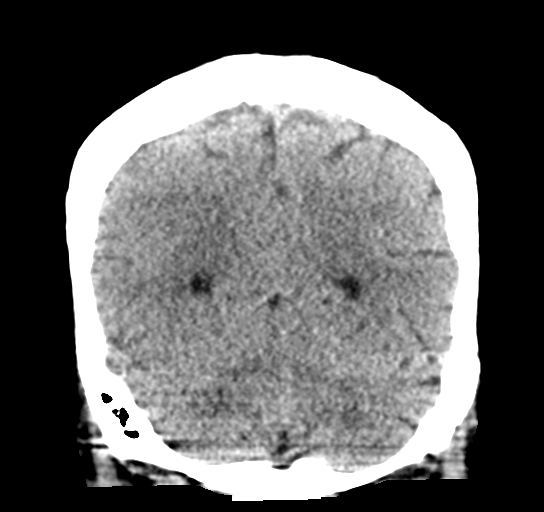
[im 31/69  brain]
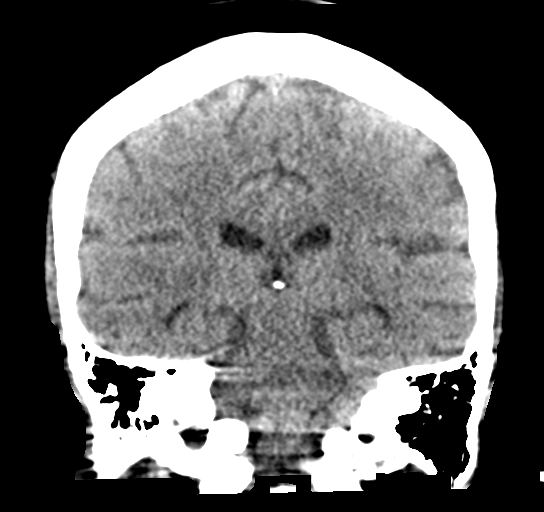
[im 38/69  brain]
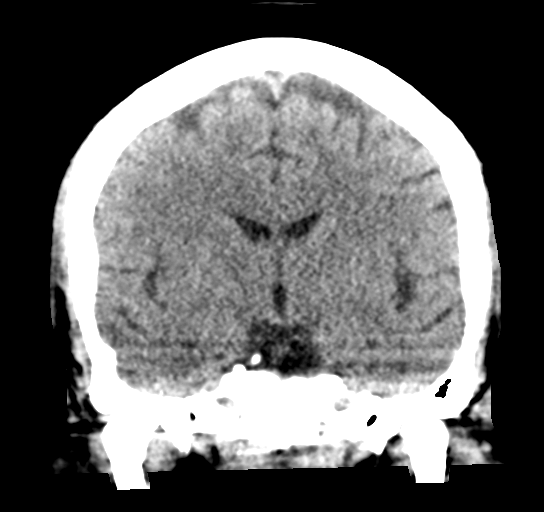

[Series 5: sagittal soft tissue · sagittal · 0.31mm/px · 3 of 56 slices shown]
[im 19/56  brain]
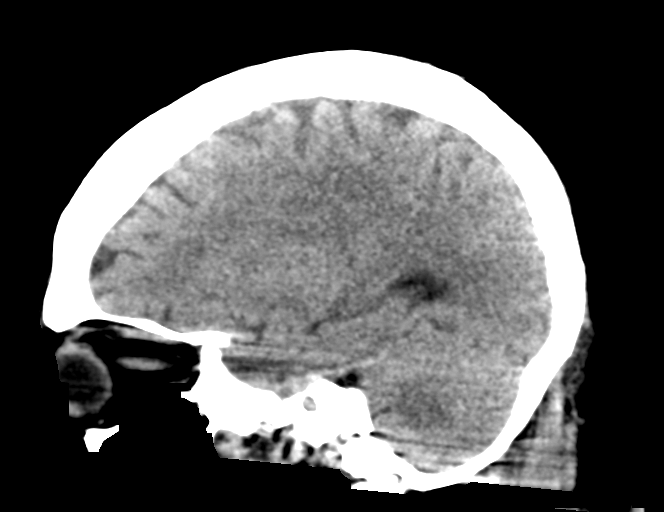
[im 28/56  brain]
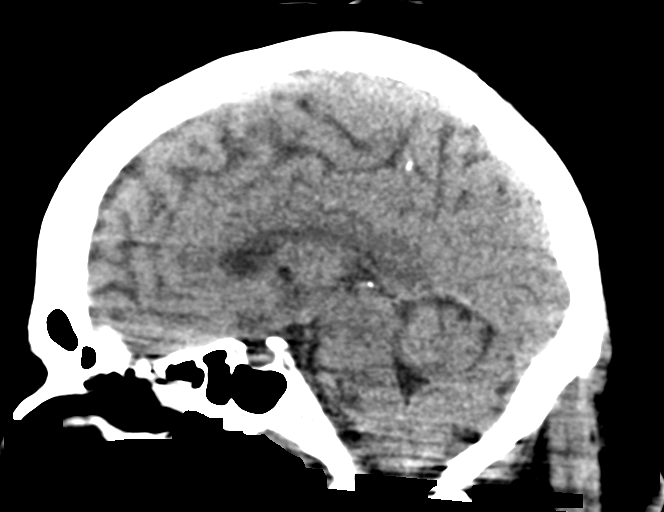
[im 37/56  brain]
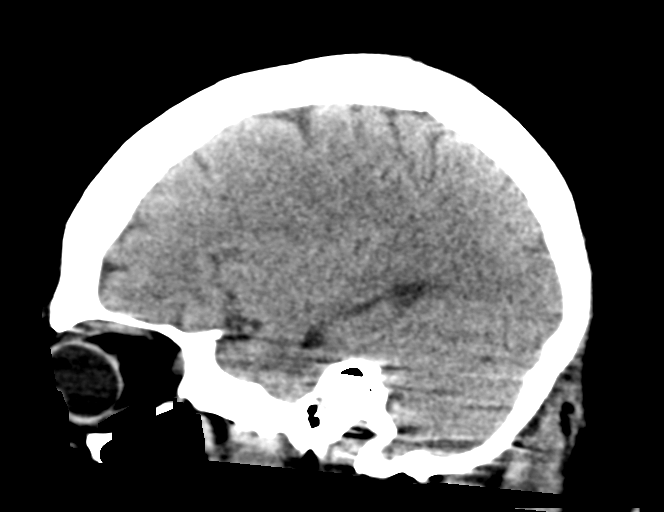

[16 of 47 positions shown; findings below may reference images not displayed]

FINDINGS: Brain: The brain shows a normal appearance without evidence of
malformation, atrophy, old or acute small or large vessel
infarction, mass lesion, hemorrhage, hydrocephalus or extra-axial
collection.

Vascular: No hyperdense vessel. No evidence of atherosclerotic
calcification.

Skull: Normal.  No traumatic finding.  No focal bone lesion.

Sinuses/Orbits: Sinuses are clear. Orbits appear normal. Mastoids
are clear.

Other: None significant
IMPRESSION: Normal head CT.

## 2020-06-11 IMAGING — MR MR THORACIC SPINE W/O CM
6 series · 32 of 48 positions shown · non-contrast
Comparison: None

CLINICAL DATA: Pneumococcal meningitis. Neck and back pain.
Evaluate for epidural abscess.

EXAM:
MRI CERVICAL, THORACIC AND LUMBAR SPINE WITHOUT CONTRAST
TECHNIQUE: Multiplanar and multiecho pulse sequences of the cervical spine, to
include the craniocervical junction and cervicothoracic junction,
and thoracic and lumbar spine, were obtained without intravenous
contrast.

[Series 18: T1 · sagittal · 6.0mm · 1.88mm/px · 4 of 9 slices shown (1 of 2)]
[im 1/9]
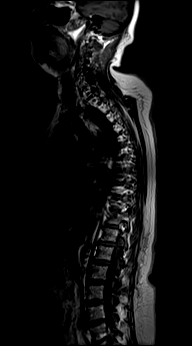
[im 3/9]
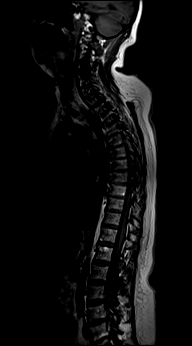
[im 6/9]
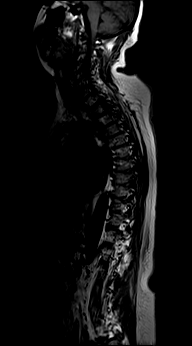
[im 9/9]
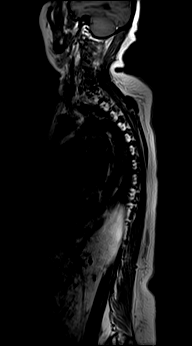

[Series 19: T2 · sagittal · 3.0mm · 1.33mm/px · 6 of 17 slices shown (1 of 2)]
[im 1/17]
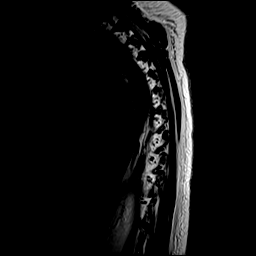
[im 4/17]
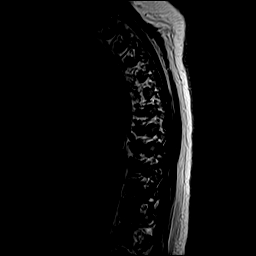
[im 7/17]
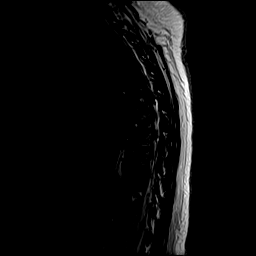
[im 10/17]
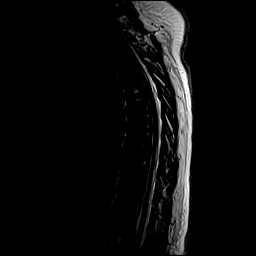
[im 13/17]
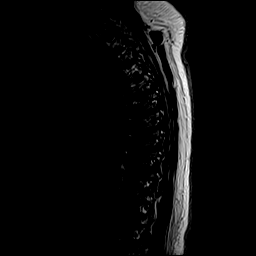
[im 17/17]
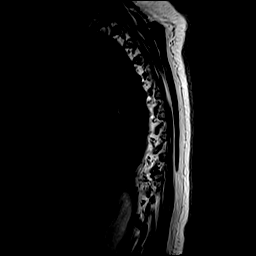

[Series 20: T1 · sagittal · 3.0mm · 1.33mm/px · 6 of 17 slices shown (2 of 2)]
[im 1/17]
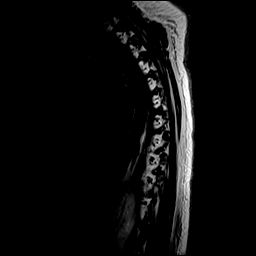
[im 4/17]
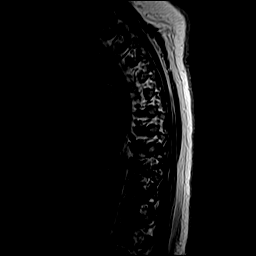
[im 7/17]
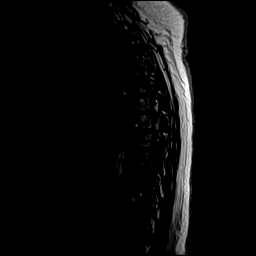
[im 10/17]
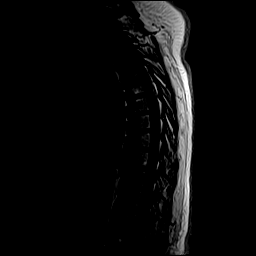
[im 13/17]
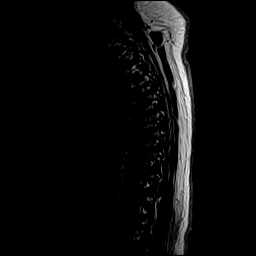
[im 17/17]
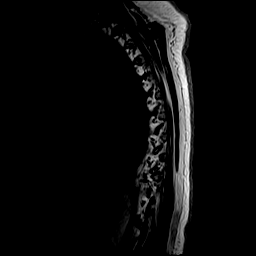

[Series 21: STIR · sagittal · 3.0mm · 0.66mm/px · 6 of 17 slices shown]
[im 1/17]
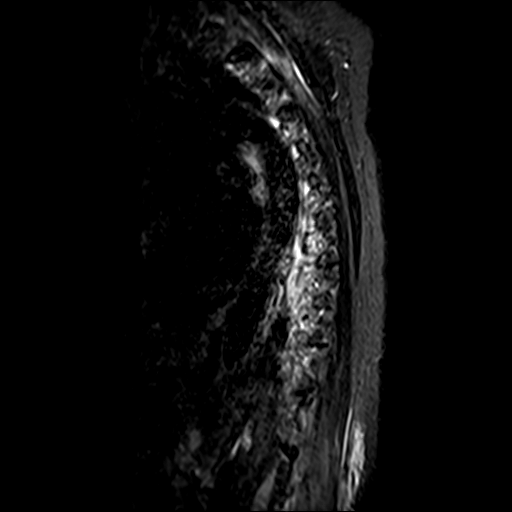
[im 4/17]
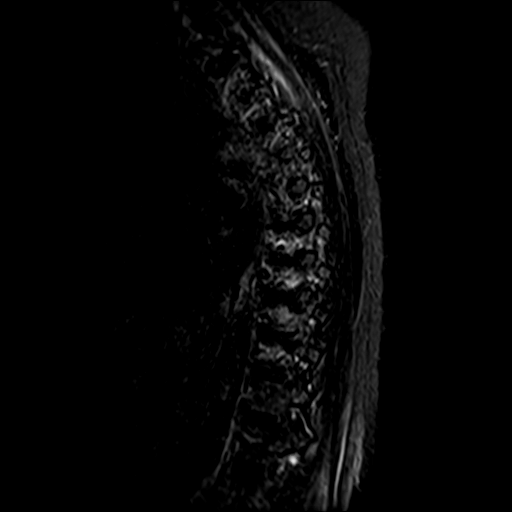
[im 7/17]
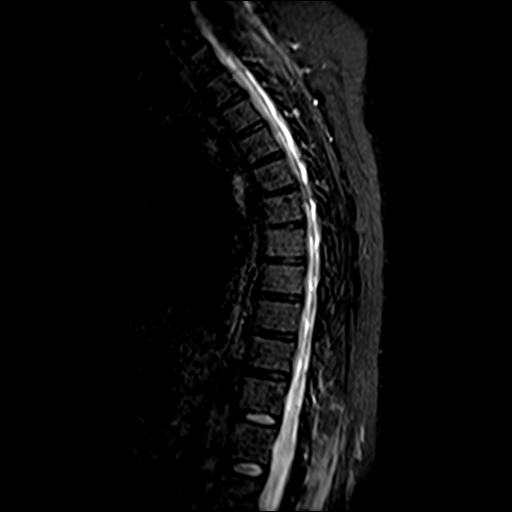
[im 10/17]
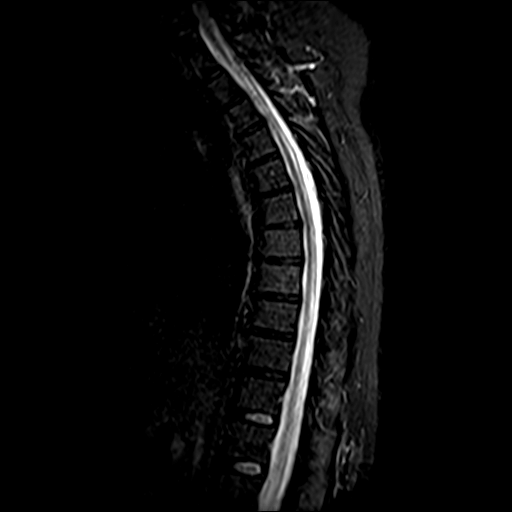
[im 13/17]
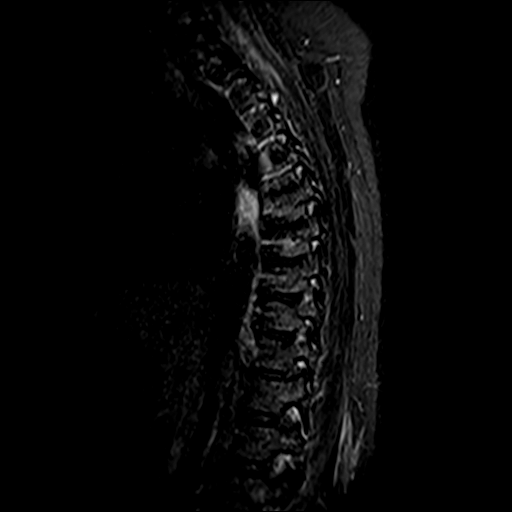
[im 17/17]
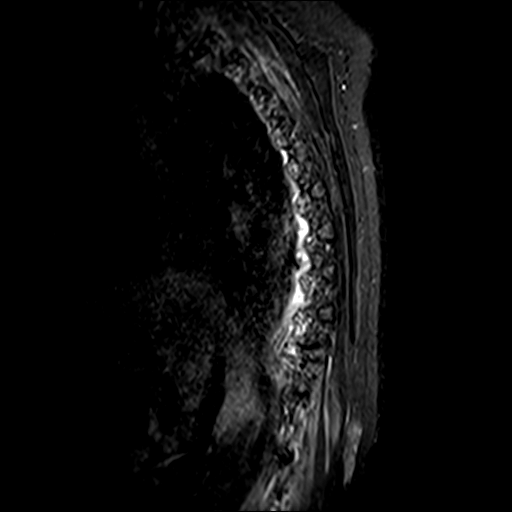

[Series 22: T2 · axial · 4.0mm · 0.59mm/px · z∈[-288,-80]mm · 9 of 36 slices shown (2 of 2)]
[im 1/36]
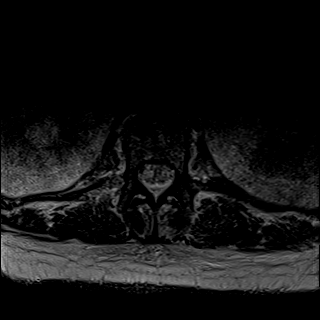
[im 6/36]
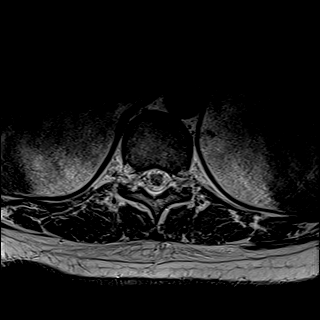
[im 12/36]
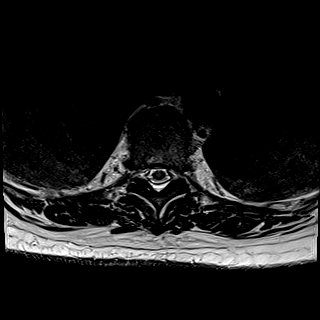
[im 15/36]
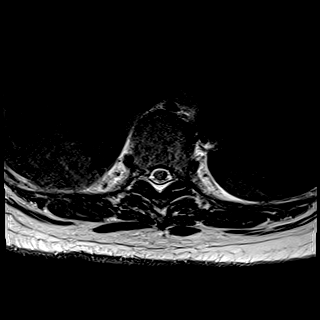
[im 18/36]
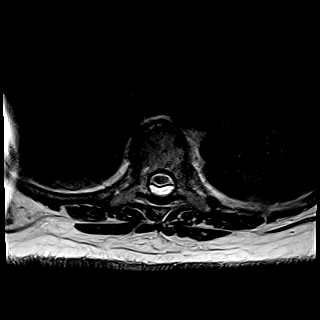
[im 21/36]
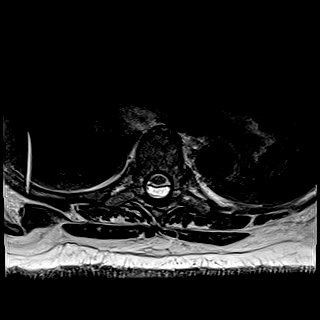
[im 24/36]
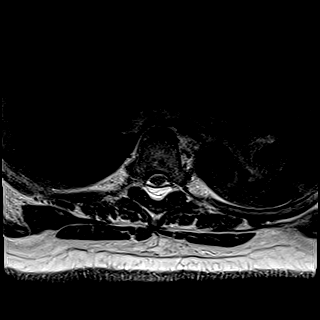
[im 30/36]
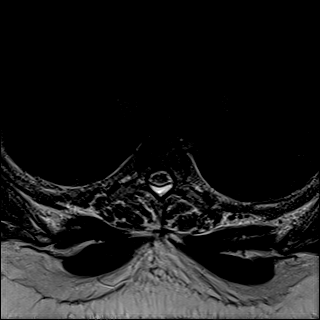
[im 36/36]
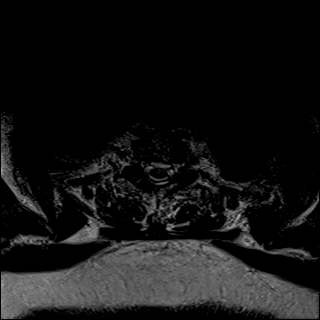

[Series 23: GRE · axial · 4.0mm · 0.37mm/px · 1 of 36 slices shown]
[im 1/36]
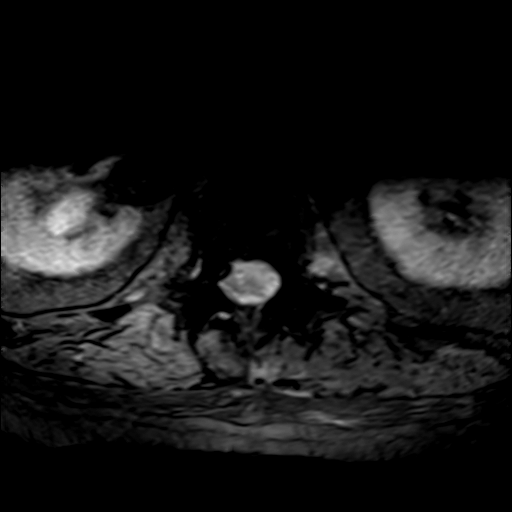

[32 of 48 positions shown; findings below may reference images not displayed]

FINDINGS: MRI CERVICAL SPINE FINDINGS

Limited examination due to significant patient motion.

Alignment: Normal alignment.

Vertebrae: Grossly normal marrow signal. No bone lesions or
fractures. No findings suspicious for discitis or osteomyelitis.

Cord: Grossly normal cord signal intensity. No cord lesion or
syrinx.

Posterior Fossa, vertebral arteries, paraspinal tissues: No
significant findings.

Disc levels:

C2-3: Shallow broad-based disc protrusion with mass effect on the
ventral thecal sac. No significant foraminal stenosis.

C3-4: Large central and left paracentral disc protrusion with mass
effect on the thecal sac and flattening of the cervical cord. Mild
foraminal stenosis bilaterally also.

C4-5: Bulging degenerated annulus, osteophytic ridging and left
paracentral disc protrusion with mass effect on the thecal sac.
There is also mild bilateral foraminal stenosis.

C5-6: Degenerated bulging annulus, central disc protrusion and facet
disease with mild bilateral foraminal stenosis.

C6-7: Bulging degenerated annulus and osteophytic ridging with
flattening of the ventral thecal sac asymmetric left. Mild left
foraminal stenosis.

C7-T1: Bulging degenerated annulus and left paracentral disc
protrusion with mild mass effect on the left side of thecal sac. No
significant foraminal stenosis.

MRI THORACIC SPINE FINDINGS

Alignment:  Normal alignment of the thoracic vertebral bodies.

Vertebrae: Normal marrow signal. Small scattered hemangiomas but no
worrisome bone lesions or fractures. No findings suspicious for
discitis or osteomyelitis. The facets are normally aligned. No facet
or laminar fractures. No evidence of facet septic arthritis.

Cord: Grossly normal thoracic spinal cord. No obvious cord lesions
or syrinx.

Paraspinal and other soft tissues: No significant paraspinal
findings.

Disc levels:

Small central disc protrusion at T6-7.

MRI LUMBAR SPINE FINDINGS

Segmentation: There are five lumbar type vertebral bodies. The last
full intervertebral disc space is labeled L5-S1.

Alignment:  Normal

Vertebrae: No bone lesions or fractures. No findings suspicious for
septic arthritis or osteomyelitis. Endplate reactive changes at
L5-S1.

Significant facet arthropathy noted on the left side at L2-3 and
L3-4 with findings consistent with septic arthritis. Associated
abscesses in the paraspinal muscles associated with both these
levels. There is also significant surrounding myositis.

Findings suspicious for a small epidural abscess on the left side at
C2-3 associated with the infected left facet joint. No significant
mass effect on the thecal sac.

Conus medullaris and cauda equina: Conus extends to the L1-2 level.
Conus and cauda equina appear normal.

Paraspinal and other soft tissues: Abscesses in the left paraspinal
muscles at L2, L3 and L4.

Disc levels:

L1-2: No significant findings.

L2-3: No findings for discitis or osteomyelitis. Small posterior
epidural abscess on the left side associated with the septic
arthritis in the left L2-3 facet joint. No disc protrusions, spinal
or foraminal stenosis.

L3-4: No disc protrusions, spinal or foraminal stenosis. Septic
arthritis involving the left L3-4 facet joint with an adjacent soft
tissue abscess measuring approximately 18 x 15 mm. Smaller adjacent
abscesses are also noted in the left paraspinal muscle.

L4-5: No evidence of discitis or osteomyelitis. There is a shallow
central disc protrusion with mass effect on the ventral thecal sac
and mild bilateral lateral recess stenosis. No foraminal stenosis.
The facet joints are intact at this level. There are some small
abscesses in the left paraspinal muscle extending down just below
the L4-5 disc space.

L5-S1: Degenerative disc disease with endplate reactive changes.
There is a shallow broad-based disc protrusion asymmetric left with
mass effect on the thecal sac and on the left S1 nerve root. No
foraminal stenosis. No septic arthritis involving the facet joints.
IMPRESSION: 1. MR findings consistent with septic arthritis involving the left
L2-3 and L3-4 facet joints with associated abscess is in the left
paraspinal muscles.
2. Suspect small epidural abscess posteriorly at L2-3 on the left
side.
3. No definite MR findings for discitis or osteomyelitis in the
cervical, thoracic or lumbar spines.
4. Degenerative cervical spondylosis with multilevel disc disease
and facet disease. Multilevel disc protrusions most significant at
C3-4.

## 2020-06-11 MED ORDER — KETOROLAC TROMETHAMINE 30 MG/ML IJ SOLN
30.0000 mg | Freq: Once | INTRAMUSCULAR | Status: AC
  Administered 2020-06-11: 30 mg via INTRAVENOUS
  Filled 2020-06-11: qty 1

## 2020-06-11 MED ORDER — CHLORHEXIDINE GLUCONATE CLOTH 2 % EX PADS
6.0000 | MEDICATED_PAD | Freq: Every day | CUTANEOUS | Status: DC
  Administered 2020-06-11 – 2020-06-27 (×17): 6 via TOPICAL

## 2020-06-11 MED ORDER — LACTATED RINGERS IV SOLN
INTRAVENOUS | Status: DC
  Administered 2020-06-12: 50 mL/h via INTRAVENOUS

## 2020-06-11 MED ORDER — FENTANYL CITRATE (PF) 100 MCG/2ML IJ SOLN
12.5000 ug | INTRAMUSCULAR | Status: DC | PRN
  Administered 2020-06-11 – 2020-06-13 (×11): 12.5 ug via INTRAVENOUS
  Filled 2020-06-11 (×12): qty 2

## 2020-06-11 MED ORDER — INSULIN ASPART 100 UNIT/ML ~~LOC~~ SOLN: 0.0000 [IU] | SUBCUTANEOUS | Status: DC

## 2020-06-11 MED ORDER — INSULIN ASPART 100 UNIT/ML ~~LOC~~ SOLN
0.0000 [IU] | SUBCUTANEOUS | Status: DC
  Administered 2020-06-11: 3 [IU] via SUBCUTANEOUS
  Administered 2020-06-11 (×2): 5 [IU] via SUBCUTANEOUS
  Administered 2020-06-11: 8 [IU] via SUBCUTANEOUS
  Administered 2020-06-11 – 2020-06-12 (×2): 3 [IU] via SUBCUTANEOUS
  Administered 2020-06-12: 2 [IU] via SUBCUTANEOUS
  Administered 2020-06-12 – 2020-06-13 (×3): 3 [IU] via SUBCUTANEOUS
  Administered 2020-06-13: 5 [IU] via SUBCUTANEOUS
  Administered 2020-06-13 (×3): 3 [IU] via SUBCUTANEOUS
  Administered 2020-06-14: 8 [IU] via SUBCUTANEOUS
  Filled 2020-06-11 (×15): qty 1

## 2020-06-11 MED ORDER — INSULIN ASPART 100 UNIT/ML ~~LOC~~ SOLN
0.0000 [IU] | SUBCUTANEOUS | Status: DC
  Administered 2020-06-11: 5 [IU] via SUBCUTANEOUS
  Filled 2020-06-11: qty 1

## 2020-06-11 MED ORDER — PANTOPRAZOLE SODIUM 40 MG IV SOLR
40.0000 mg | Freq: Once | INTRAVENOUS | Status: AC
  Administered 2020-06-11: 40 mg via INTRAVENOUS
  Filled 2020-06-11: qty 40

## 2020-06-11 MED ORDER — NICARDIPINE HCL IN NACL 20-0.86 MG/200ML-% IV SOLN: 3.0000 mg/h | INTRAVENOUS | Status: DC

## 2020-06-11 MED ORDER — CHLORHEXIDINE GLUCONATE 0.12 % MT SOLN
15.0000 mL | Freq: Two times a day (BID) | OROMUCOSAL | Status: DC
  Administered 2020-06-11 – 2020-06-27 (×24): 15 mL via OROMUCOSAL
  Filled 2020-06-11 (×25): qty 15

## 2020-06-11 MED ORDER — ENOXAPARIN SODIUM 40 MG/0.4ML ~~LOC~~ SOLN
40.0000 mg | SUBCUTANEOUS | Status: DC
  Administered 2020-06-11: 40 mg via SUBCUTANEOUS
  Filled 2020-06-11: qty 0.4

## 2020-06-11 MED ORDER — FENTANYL CITRATE (PF) 100 MCG/2ML IJ SOLN
12.5000 ug | Freq: Once | INTRAMUSCULAR | Status: AC
  Administered 2020-06-11: 12.5 ug via INTRAVENOUS

## 2020-06-11 MED ORDER — DEXMEDETOMIDINE HCL IN NACL 400 MCG/100ML IV SOLN
0.4000 ug/kg/h | INTRAVENOUS | Status: DC
  Administered 2020-06-11: 1 ug/kg/h via INTRAVENOUS
  Administered 2020-06-11: 1.2 ug/kg/h via INTRAVENOUS
  Administered 2020-06-11: 0.4 ug/kg/h via INTRAVENOUS
  Administered 2020-06-12: 0.6 ug/kg/h via INTRAVENOUS
  Administered 2020-06-12: 0.8 ug/kg/h via INTRAVENOUS
  Administered 2020-06-12: 0.4 ug/kg/h via INTRAVENOUS
  Administered 2020-06-12: 0.5 ug/kg/h via INTRAVENOUS
  Administered 2020-06-12 – 2020-06-13 (×2): 0.8 ug/kg/h via INTRAVENOUS
  Administered 2020-06-13 (×3): 1 ug/kg/h via INTRAVENOUS
  Administered 2020-06-14 (×2): 0.8 ug/kg/h via INTRAVENOUS
  Administered 2020-06-14: 1 ug/kg/h via INTRAVENOUS
  Filled 2020-06-11 (×17): qty 100

## 2020-06-11 MED ORDER — LABETALOL HCL 5 MG/ML IV SOLN
10.0000 mg | INTRAVENOUS | Status: DC | PRN
  Administered 2020-06-11 – 2020-06-16 (×4): 10 mg via INTRAVENOUS
  Filled 2020-06-11 (×4): qty 4

## 2020-06-11 MED ORDER — ORAL CARE MOUTH RINSE
15.0000 mL | Freq: Two times a day (BID) | OROMUCOSAL | Status: DC
  Administered 2020-06-11 – 2020-06-26 (×22): 15 mL via OROMUCOSAL

## 2020-06-11 MED ORDER — INSULIN GLARGINE 100 UNIT/ML ~~LOC~~ SOLN
25.0000 [IU] | Freq: Every day | SUBCUTANEOUS | Status: DC
  Administered 2020-06-11 – 2020-06-13 (×3): 25 [IU] via SUBCUTANEOUS
  Filled 2020-06-11 (×4): qty 0.25

## 2020-06-11 MED ORDER — INSULIN ASPART 100 UNIT/ML ~~LOC~~ SOLN
5.0000 [IU] | Freq: Once | SUBCUTANEOUS | Status: AC
  Administered 2020-06-11: 5 [IU] via SUBCUTANEOUS
  Filled 2020-06-11: qty 1

## 2020-06-11 NOTE — Progress Notes (Signed)
eeg done °

## 2020-06-11 NOTE — Progress Notes (Signed)
Per ID [name] MD to wait negative blood  culture result before inserting PICC. RN aware.

## 2020-06-11 NOTE — Progress Notes (Signed)
ID Pt sedated Gets agitated when stimulated Patient Vitals for the past 24 hrs:  BP Temp Temp src Pulse Resp SpO2  06/11/20 1000 96/83 97.88 F (36.6 C) -- 85 (!) 28 100 %  06/11/20 0930 139/90 98.06 F (36.7 C) -- 97 (!) 30 99 %  06/11/20 0900 (!) 141/103 98.24 F (36.8 C) -- (!) 107 19 100 %  06/11/20 0845 116/85 98.24 F (36.8 C) -- (!) 113 (!) 28 97 %  06/11/20 0830 (!) 129/94 98.24 F (36.8 C) -- (!) 111 (!) 26 100 %  06/11/20 0815 129/85 98.24 F (36.8 C) -- (!) 112 (!) 35 100 %  06/11/20 0800 -- 98.06 F (36.7 C) -- (!) 112 (!) 21 100 %  06/11/20 0745 (!) 107/50 98.24 F (36.8 C) -- (!) 104 (!) 28 100 %  06/11/20 0730 (!) 123/99 98.24 F (36.8 C) -- (!) 109 (!) 21 100 %  06/11/20 0715 103/65 98.42 F (36.9 C) -- (!) 109 (!) 24 96 %  06/11/20 0700 -- 98.42 F (36.9 C) -- (!) 117 (!) 37 96 %  06/11/20 0500 (!) 149/116 -- -- (!) 115 (!) 33 99 %  06/11/20 0447 135/83 -- -- (!) 111 (!) 33 100 %  06/11/20 0428 (!) 130/98 -- -- (!) 113 (!) 30 99 %  06/11/20 0302 140/90 99 F (37.2 C) Rectal (!) 102 (!) 28 100 %  06/11/20 0300 (!) 149/90 -- -- (!) 103 -- 100 %  06/11/20 0158 (!) 217/189 -- -- (!) 123 (!) 28 99 %  06/11/20 0127 (!) 171/101 -- -- (!) 133 (!) 26 100 %  06/10/20 2332 (!) 168/78 -- -- (!) 117 (!) 28 94 %  06/10/20 2115 (!) 158/63 -- -- (!) 122 (!) 28 94 %  06/10/20 1937 (!) 163/100 -- -- (!) 127 (!) 26 98 %  06/10/20 1816 (!) 148/107 -- -- (!) 128 (!) 28 94 %  06/10/20 1751 -- 99 F (37.2 C) Rectal -- -- --  06/10/20 1620 131/67 -- -- (!) 139 20 97 %  O/e calm Sedated Chest b/l air entry HS-tahcycardia Abd soft CNS cannot be assessed   CBC Latest Ref Rng & Units 06/11/2020 06/10/2020 06/09/2020  WBC 4.0 - 10.5 K/uL 12.5(H) 12.2(H) 16.0(H)  Hemoglobin 12.0 - 15.0 g/dL 10.8(L) 12.3 10.1(L)  Hematocrit 36.0 - 46.0 % 31.9(L) 37.1 31.0(L)  Platelets 150 - 400 K/uL 272 286 369    CMP Latest Ref Rng & Units 06/11/2020 06/10/2020 06/09/2020  Glucose 70 - 99  mg/dL 308(H) 191(H) 455(H)  BUN 6 - 20 mg/dL 20 24(H) 30(H)  Creatinine 0.44 - 1.00 mg/dL 1.01(H) 1.17(H) 1.56(H)  Sodium 135 - 145 mmol/L 145 139 133(L)  Potassium 3.5 - 5.1 mmol/L 3.7 3.7 4.7  Chloride 98 - 111 mmol/L 113(H) 106 100  CO2 22 - 32 mmol/L 21(L) 21(L) 18(L)  Calcium 8.9 - 10.3 mg/dL 8.9 9.1 9.2  Total Protein 6.5 - 8.1 g/dL - 7.2 7.6  Total Bilirubin 0.3 - 1.2 mg/dL - 2.4(H) 2.2(H)  Alkaline Phos 38 - 126 U/L - 204(H) 211(H)  AST 15 - 41 U/L - 39 44(H)  ALT 0 - 44 U/L - 27 31   Micro BC- strep pneumo CSF culture strep pneumo on gram stain  Impression/recommendation 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

## 2020-06-11 NOTE — Progress Notes (Signed)
Patient ID: Tonya Myers, female   DOB: [birth year], 55 y.o.   MRN: [id number] [facility] PROGRESS NOTE  Tonya Myers HER:[id number] DOB: 10-Mar-1966 DOA: 06/09/2020 PCP: [name], MD  HPI/Subjective: Patient seen this morning prior to Precedex drip.  She was still confused and unable to talk.  She was moving all of her extremities.  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  Objective: Vitals:   06/11/20 0447 06/11/20 0500  BP: 135/83 (!) 149/116  Pulse: (!) 111 (!) 115  Resp: (!) 33 (!) 33  Temp:    SpO2: 100% 99%    Intake/Output Summary (Last 24 hours) at 06/11/2020 0818 Last data filed at 06/11/2020 0600 Gross per 24 hour  Intake 381.92 ml  Output 740 ml  Net -358.08 ml   Filed Weights   06/09/20 1905  Weight: 111.1 kg    ROS: Review of Systems  Unable to perform ROS: Acuity of condition   Exam: Physical Exam HENT:     Head: Normocephalic.     Mouth/Throat:     Comments: Unable to look into mouth today. Eyes:     General: Lids are normal.     Conjunctiva/sclera: Conjunctivae normal.  Cardiovascular:     Rate and Rhythm: Normal rate and regular rhythm.     Heart sounds: Normal heart sounds, S1 normal and S2 normal.  Pulmonary:     Breath sounds: Normal breath sounds. No decreased breath sounds, wheezing, rhonchi or rales.  Abdominal:     Palpations: Abdomen is soft.     Tenderness: There is no abdominal tenderness.  Musculoskeletal:     Right ankle: No swelling.     Left ankle: No swelling.  Skin:    General: Skin is warm.     Findings: No rash.  Neurological:     Mental Status: She is disoriented.  Psychiatric:        Behavior: Behavior is agitated.       Data Reviewed: Basic Metabolic Panel: Recent Labs  Lab 06/09/20 1157 06/09/20 2037 06/10/20 0600 06/11/20 0316  NA 137 133* 139 145  K 4.1 4.7 3.7 3.7  CL 104 100 106 113*  CO2 17* 18* 21* 21*  GLUCOSE 274* 455* 191* 308*  BUN 20  30* 24* 20  CREATININE 1.35* 1.56* 1.17* 1.01*  CALCIUM 9.3 9.2 9.1 8.9  MG 1.8  --   --   --   PHOS 3.8  --   --   --    Liver Function Tests: Recent Labs  Lab 06/09/20 1157 06/09/20 2037 06/10/20 0600  AST 50* 44* 39  ALT 36 31 27  ALKPHOS 200* 211* 204*  BILITOT 2.1* 2.2* 2.4*  PROT 7.3 7.6 7.2  ALBUMIN 2.8* 2.8* 2.4*   Recent Labs  Lab 06/09/20 1157  LIPASE 29   Recent Labs  Lab 06/09/20 2145  AMMONIA 14   CBC: Recent Labs  Lab 06/09/20 1157 06/09/20 2037 06/10/20 0600 06/11/20 0316  WBC 15.5* 16.0* 12.2* 12.5*  HGB 10.1* 10.1* 12.3 10.8*  HCT 31.4* 31.0* 37.1 31.9*  MCV 80.1 79.3* 78.1* 77.1*  PLT 330 369 286 272     CBG: Recent Labs  Lab 06/10/20 1815 06/10/20 2250 06/11/20 0230 06/11/20 0409 06/11/20 0726  GLUCAP 190* 250* 271* 279* 241*    Recent Results (from the past 240 hour(s))  Resp Panel by RT-PCR (Flu A&B, Covid) Nasopharyngeal Swab     Status: None   Collection Time: 06/09/20  9:45 PM  Specimen: Nasopharyngeal Swab; Nasopharyngeal(NP) swabs in vial transport medium  Result Value Ref Range Status   SARS Coronavirus 2 by RT PCR NEGATIVE NEGATIVE Final    Comment: (NOTE) SARS-CoV-2 target nucleic acids are NOT DETECTED.  The SARS-CoV-2 RNA is generally detectable in upper respiratory specimens during the acute phase of infection. The lowest concentration of SARS-CoV-2 viral copies this assay can detect is 138 copies/mL. A negative result does not preclude SARS-Cov-2 infection and should not be used as the sole basis for treatment or other patient management decisions. A negative result may occur with  improper specimen collection/handling, submission of specimen other than nasopharyngeal swab, presence of viral mutation(s) within the areas targeted by this assay, and inadequate number of viral copies(<138 copies/mL). A negative result must be combined with clinical observations, patient history, and epidemiological information.  The expected result is Negative.  Fact Sheet for Patients:  EntrepreneurPulse.com.au  Fact Sheet for Healthcare Providers:  IncredibleEmployment.be  This test is no t yet approved or cleared by the [place] FDA and  has been authorized for detection and/or diagnosis of SARS-CoV-2 by FDA under an Emergency Use Authorization (EUA). This EUA will remain  in effect (meaning this test can be used) for the duration of the COVID-19 declaration under Section 564(b)(1) of the Act, 21 U.S.C.section 360bbb-3(b)(1), unless the authorization is terminated  or revoked sooner.       Influenza A by PCR NEGATIVE NEGATIVE Final   Influenza B by PCR NEGATIVE NEGATIVE Final    Comment: (NOTE) The Xpert Xpress SARS-CoV-2/FLU/RSV plus assay is intended as an aid in the diagnosis of influenza from Nasopharyngeal swab specimens and should not be used as a sole basis for treatment. Nasal washings and aspirates are unacceptable for Xpert Xpress SARS-CoV-2/FLU/RSV testing.  Fact Sheet for Patients: EntrepreneurPulse.com.au  Fact Sheet for Healthcare Providers: IncredibleEmployment.be  This test is not yet approved or cleared by the [place] FDA and has been authorized for detection and/or diagnosis of SARS-CoV-2 by FDA under an Emergency Use Authorization (EUA). This EUA will remain in effect (meaning this test can be used) for the duration of the COVID-19 declaration under Section 564(b)(1) of the Act, 21 U.S.C. section 360bbb-3(b)(1), unless the authorization is terminated or revoked.  Performed at [facility], [address] [place], [place] [address]   CULTURE, BLOOD (ROUTINE X 2) w Reflex to ID Panel     Status: None (Preliminary result)   Collection Time: 06/10/20  9:40 AM   Specimen: BLOOD  Result Value Ref Range Status   Specimen Description BLOOD BLOOD LEFT FOREARM  Final   Special Requests    Final    BOTTLES DRAWN AEROBIC AND ANAEROBIC Blood Culture adequate volume   Culture  Setup Time   Final    Organism ID to follow IN BOTH AEROBIC AND ANAEROBIC BOTTLES GRAM POSITIVE COCCI CRITICAL RESULT CALLED TO, READ BACK BY AND VERIFIED WITH: SUSAN WATSON @1941  ON 06/10/20 SKL Performed at [facility], [address], [place], [place] [address]    Culture GRAM POSITIVE COCCI  Final   Report Status PENDING  Incomplete  Blood Culture ID Panel (Reflexed)     Status: Abnormal   Collection Time: 06/10/20  9:40 AM  Result Value Ref Range Status   Enterococcus faecalis NOT DETECTED NOT DETECTED Final   Enterococcus Faecium NOT DETECTED NOT DETECTED Final   Listeria monocytogenes NOT DETECTED NOT DETECTED Final   Staphylococcus species NOT DETECTED NOT DETECTED Final   Staphylococcus  aureus (BCID) NOT DETECTED NOT DETECTED Final   Staphylococcus epidermidis NOT DETECTED NOT DETECTED Final   Staphylococcus lugdunensis NOT DETECTED NOT DETECTED Final   Streptococcus species DETECTED (A) NOT DETECTED Final    Comment: CRITICAL RESULT CALLED TO, READ BACK BY AND VERIFIED WITH: SUSAN WATSON @1941  ON 06/10/20 SKL    Streptococcus agalactiae NOT DETECTED NOT DETECTED Final   Streptococcus pneumoniae DETECTED (A) NOT DETECTED Final    Comment: CRITICAL RESULT CALLED TO, READ BACK BY AND VERIFIED WITH: SUSAN WATSON @1941  ON 06/10/20 SKL    Streptococcus pyogenes NOT DETECTED NOT DETECTED Final   A.calcoaceticus-baumannii NOT DETECTED NOT DETECTED Final   Bacteroides fragilis NOT DETECTED NOT DETECTED Final   Enterobacterales NOT DETECTED NOT DETECTED Final   Enterobacter cloacae complex NOT DETECTED NOT DETECTED Final   Escherichia coli NOT DETECTED NOT DETECTED Final   Klebsiella aerogenes NOT DETECTED NOT DETECTED Final   Klebsiella oxytoca NOT DETECTED NOT DETECTED Final   Klebsiella pneumoniae NOT DETECTED NOT DETECTED Final   Proteus species NOT DETECTED NOT DETECTED Final    Salmonella species NOT DETECTED NOT DETECTED Final   Serratia marcescens NOT DETECTED NOT DETECTED Final   Haemophilus influenzae NOT DETECTED NOT DETECTED Final   Neisseria meningitidis NOT DETECTED NOT DETECTED Final   Pseudomonas aeruginosa NOT DETECTED NOT DETECTED Final   Stenotrophomonas maltophilia NOT DETECTED NOT DETECTED Final   Candida albicans NOT DETECTED NOT DETECTED Final   Candida auris NOT DETECTED NOT DETECTED Final   Candida glabrata NOT DETECTED NOT DETECTED Final   Candida krusei NOT DETECTED NOT DETECTED Final   Candida parapsilosis NOT DETECTED NOT DETECTED Final   Candida tropicalis NOT DETECTED NOT DETECTED Final   Cryptococcus neoformans/gattii NOT DETECTED NOT DETECTED Final    Comment: Performed at [facility], [address] [place], [place] [address]  CULTURE, BLOOD (ROUTINE X 2) w Reflex to ID Panel     Status: None (Preliminary result)   Collection Time: 06/10/20 11:26 AM   Specimen: BLOOD  Result Value Ref Range Status   Specimen Description BLOOD LEFT ARM  Final   Special Requests   Final    BOTTLES DRAWN AEROBIC ONLY Blood Culture adequate volume   Culture   Final    NO GROWTH < 24 HOURS Performed at [facility], [address] [place], [name] [address]    Report Status PENDING  Incomplete  CSF culture w Stat Gram Stain     Status: None (Preliminary result)   Collection Time: 06/10/20  2:19 PM   Specimen: Cerebrospinal Fluid  Result Value Ref Range Status   Specimen Description CSF  Final   Special Requests NONE  Final   Gram Stain   Final    GRAM POSITIVE DIPLOCOCCI CRITICAL RESULT CALLED TO, READ BACK BY AND VERIFIED WITH: KORY ANDREWS @1627  06/10/20 MJU WBC SEEN RBC SEEN Performed at [facility], [address] [place], [place] [address]    Culture PENDING  Incomplete   Report Status PENDING  Incomplete  MRSA PCR Screening     Status: None   Collection Time: 06/11/20  4:56 AM   Specimen: Nasopharyngeal   Result Value Ref Range Status   MRSA by PCR NEGATIVE NEGATIVE Final    Comment:        The GeneXpert MRSA Assay (FDA approved for NASAL specimens only), is one component of a comprehensive MRSA colonization surveillance program. It is not intended to diagnose MRSA infection nor to guide or  monitor treatment for MRSA infections. Performed at [facility], [address] [place], [place] [address]      Studies: DG Eye Foreign Body  Result Date: 06/10/2020 CLINICAL DATA:  Metal working/exposure; clearance prior to MRI EXAM: ORBITS FOR FOREIGN BODY - 2 VIEW COMPARISON:  None. FINDINGS: There is no evidence of metallic foreign body within the orbits. No significant bone abnormality identified. IMPRESSION: No evidence of metallic foreign body within the orbits. Electronically Signed   By: Ulyses Jarred M.D.   On: 06/10/2020 00:26   DG Chest 2 View  Result Date: 06/09/2020 CLINICAL DATA:  Shortness of breath EXAM: CHEST - 2 VIEW COMPARISON:  05/23/2020 FINDINGS: Heart and mediastinal contours are within normal limits. No focal opacities or effusions. No acute bony abnormality. IMPRESSION: No active cardiopulmonary disease. Electronically Signed   By: Rolm Baptise M.D.   On: 06/09/2020 12:25   DG Pelvis 1-2 Views  Result Date: 06/10/2020 CLINICAL DATA:  MRI clearance EXAM: PELVIS - 1-2 VIEW COMPARISON:  None. FINDINGS: There is no evidence of pelvic fracture or diastasis. No pelvic bone lesions are seen. IMPRESSION: No metallic foreign body. Electronically Signed   By: Ulyses Jarred M.D.   On: 06/10/2020 00:26   DG Abd 1 View  Result Date: 06/10/2020 CLINICAL DATA:  MRI clearance EXAM: ABDOMEN - 1 VIEW COMPARISON:  None. FINDINGS: The bowel gas pattern is normal. No radio-opaque calculi or other significant radiographic abnormality are seen. IMPRESSION: No metallic foreign body. Electronically Signed   By: Ulyses Jarred M.D.   On: 06/10/2020 00:27   CT Head Wo Contrast  Result  Date: 06/09/2020 CLINICAL DATA:  Delirium EXAM: CT HEAD WITHOUT CONTRAST TECHNIQUE: Contiguous axial images were obtained from the base of the skull through the vertex without intravenous contrast. COMPARISON:  None. FINDINGS: Brain: No evidence of large-territorial acute infarction. No parenchymal hemorrhage. No mass lesion. No extra-axial collection. No mass effect or midline shift. No hydrocephalus. Basilar cisterns are patent. Vascular: No hyperdense vessel. Skull: No acute fracture or focal lesion. Sinuses/Orbits: Paranasal sinuses and mastoid air cells are clear. The orbits are unremarkable. Other: None. IMPRESSION: No acute intracranial abnormality. Electronically Signed   By: Iven Finn M.D.   On: 06/09/2020 22:19   MR BRAIN WO CONTRAST  Result Date: 06/10/2020 CLINICAL DATA:  Altered mental status. Possible drug overdose. Seroquel. EXAM: MRI HEAD WITHOUT CONTRAST TECHNIQUE: Multiplanar, multiecho pulse sequences of the brain and surrounding structures were obtained without intravenous contrast. COMPARISON:  None. FINDINGS: Brain: No acute infarct, mass effect or extra-axial collection. No acute or chronic hemorrhage. Normal white matter signal, parenchymal volume and CSF spaces. The midline structures are normal. Vascular: Major flow voids are preserved. Skull and upper cervical spine: Normal calvarium and skull base. Visualized upper cervical spine and soft tissues are normal. Sinuses/Orbits:No paranasal sinus fluid levels or advanced mucosal thickening. No mastoid or middle ear effusion. Normal orbits. IMPRESSION: Normal brain MRI. Electronically Signed   By: Ulyses Jarred M.D.   On: 06/10/2020 03:42   [place] EKG SITE RITE  Result Date: 06/10/2020 If Site Rite image not attached, placement could not be confirmed due to current cardiac rhythm.  DG FL GUIDED LUMBAR PUNCTURE  Result Date: 06/10/2020 CLINICAL DATA:  Concern for meningitis EXAM: DIAGNOSTIC LUMBAR PUNCTURE UNDER FLUOROSCOPIC  GUIDANCE FLUOROSCOPY TIME:  Fluoroscopy Time:  18 seconds Radiation Exposure Index (if provided by the fluoroscopic device): 4.2 mGy PROCEDURE: Informed consent was obtained from the patient prior to the procedure, including  potential complications of headache, allergy, and pain. With the patient prone, the lower back was prepped with Betadine. 1% Lidocaine was used for local anesthesia. Lumbar puncture was performed at the L2-L3 level using a 22 gauge needle with return of turbid CSF. 1 ml of CSF obtained for laboratory studies. The patient tolerated the procedure well and there were no apparent complications. IMPRESSION: Technically successful fluoroscopic guided lumbar puncture. Electronically Signed   By: Macy Mis M.D.   On: 06/10/2020 15:59    Scheduled Meds: . chlorhexidine  15 mL Mouth Rinse BID  . Chlorhexidine Gluconate Cloth  6 each Topical Q0600  . insulin aspart  0-15 Units Subcutaneous Q4H  . levothyroxine  150 mcg Oral Q0600  . mouth rinse  15 mL Mouth Rinse q12n4p  . sodium chloride flush  3 mL Intravenous Q12H   Continuous Infusions: . cefTRIAXone (ROCEPHIN)  IV Stopped (06/10/20 2343)  . dexmedetomidine (PRECEDEX) IV infusion    . lactated ringers Stopped (06/10/20 0459)  . vancomycin Stopped (06/11/20 0056)    Assessment/Plan:  1. 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  Acute metabolic encephalopathy and acute kidney injury..  Patient with blood cultures and also CSF positive.  Patient on Rocephin and vancomycin.  CSF drawn yesterday was brownish in color.  Neurology ordered MRI of the spine since the patient was having back pain and had steroid injections. 2. Acute metabolic encephalopathy.  Patient was agitated this morning and last night and needed to be started on Precedex drip.  With reevaluation late morning patient was sleeping comfortably on Precedex drip. 3. Diabetic ketoacidosis.  This has resolved.  On long-acting insulin  plus sliding scale. 4. Acute kidney injury.  Creatinine elevated at 1.56 and has come down to 1.01. 5. Elevated liver function test likely secondary to sepsis.  Recheck liver function test again tomorrow. 6. Hypothyroidism unspecified.  On levothyroxine but likely will not be able to take orally at this point. 7. Obesity with a BMI of 33.23 8. History of CLL     Code Status:     Code Status Orders  (From admission, onward)         Start     Ordered   06/09/20 2313  Full code  Continuous        06/09/20 2314        Code Status History    This patient has a current code status but no historical code status.   Advance Care Planning Activity     Family Communication: Spoke with significant other at the bedside Disposition Plan: Status is: Inpatient  Dispo: The patient is from: Home              Anticipated d/c is to: Home versus rehab              Anticipated d/c date is: Likely will need over a week here in the hospital              Patient currently being treated for severe sepsis and pneumococcal meningitis   Difficult to place patient.  Hopefully not  Consultants:  Neurology  Infectious disease  Procedures:  Lumbar puncture 04/09/2021 by interventional radiology  Antibiotics:  Rocephin  Vancomycin  Time spent: 29 minutes  [place]

## 2020-06-11 NOTE — Progress Notes (Signed)
NEUROLOGY CONSULTATION PROGRESS NOTE   Date of service: June 11, 2020 Patient Name: Tonya Myers MRN:  [id number] DOB:  1965/07/04  Brief HPI  Tonya Myers is a 55 y.o. female admitted with aphasia out of proportion to encephalopathy, fever and tachycardia x 2 days. 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   Interval Hx   Perhaps very minor improvement in mentation. No seizure activity noted by staff or by her significant other. Started on precedex for agitation and now in ICU.  Vitals   Vitals:   06/11/20 0845 06/11/20 0900 06/11/20 0930 06/11/20 1000  BP: 116/85 (!) 141/103 139/90 96/83  Pulse: (!) 113 (!) 107 97 85  Resp: (!) 28 19 (!) 30 (!) 28  Temp: 98.24 F (36.8 C) 98.24 F (36.8 C) 98.06 F (36.7 C) 97.88 F (36.6 C)  TempSrc:      SpO2: 97% 100% 99% 100%  Weight:      Height:         Body mass index is 33.23 kg/m.  Physical Exam   General: Laying in bed; in no acute distress. HENT: Normal oropharynx and mucosa. Normal external appearance of ears and nose. Neck: Supple, + pain and tenderness CV: No JVD. No peripheral edema. Pulmonary: Symmetric Chest rise. Tachypneic. Abdomen: Soft to touch, non-tender. Ext: No cyanosis, edema, or deformity Skin: No rash. Normal palpation of skin. Musculoskeletal: Normal digits and nails by inspection. No clubbing.  Neurologic Examination  Mental status/Cognition: Somnolent, opens eyes briefly, briefly looks at my face and at her significant other in the room. Does not maintain eye contact. Speech/language: She did say "yes" when I asked her name but no other speech. Cranial nerves:   CN II Pupils equal and reactive to light, blinks to threat BL.   CN III,IV,VI EOM intact to dolls eyes.   CN V    CN VII no asymmetry, no nasolabial fold flattening   CN VIII Turns head towards speech.   CN IX & X    CN XI    CN XII    Motor:  Muscle bulk: normal, tone normal  Spontaneous  movement in all extremities. Does wiggles toes to commands.  Reflexes:  Right Left Comments  Pectoralis      Biceps (C5/6) 2 2   Brachioradialis (C5/6) 2 2    Triceps (C6/7) 1 1    Patellar (L3/4) 2 2    Achilles (S1)      Hoffman      Plantar     Jaw jerk    Sensation:  Light touch Withdraws to nailbed pressure in all extremities.   Pin prick    Temperature    Vibration   Proprioception    Coordination/Complex Motor:  Unable to assess.  Labs   Basic Metabolic Panel:  Lab Results  Component Value Date   NA 145 06/11/2020   K 3.7 06/11/2020   CO2 21 (L) 06/11/2020   GLUCOSE 308 (H) 06/11/2020   BUN 20 06/11/2020   CREATININE 1.01 (H) 06/11/2020   CALCIUM 8.9 06/11/2020   GFRNONAA >60 06/11/2020   GFRAA >60 01/17/2020   HbA1c:  Lab Results  Component Value Date   HGBA1C 8.0 (H) 06/09/2020   LDL: No results found for: [facility] Urine Drug Screen:     Component Value Date/Time   LABOPIA NONE DETECTED 06/09/2020 2145   COCAINSCRNUR NONE DETECTED 06/09/2020 2145   LABBENZ NONE DETECTED 06/09/2020 2145   AMPHETMU NONE DETECTED 06/09/2020 2145  THCU NONE DETECTED 06/09/2020 2145   LABBARB NONE DETECTED 06/09/2020 2145    Alcohol Level     Component Value Date/Time   ETH <10 06/09/2020 2037   No results found for: PHENYTOIN, ZONISAMIDE, LAMOTRIGINE, LEVETIRACETA No results found for: PHENYTOIN, PHENOBARB, VALPROATE, CBMZ  Imaging and Diagnostic studies  CSF Studies: Turbid CSF WBC: 63,442. RBCs: 20,290 CSF Glucose: < 20 CSF Protein: > 600  MR BRAIN WO CONTRAST Normal brain MRI.  MRI C, T, L spine w/o contrast: Pending  Routine EEG: Pending.   Impression   Tonya Myers is a 55 y.o. female admitted with Pneumococcal Meningitis and Sepsis. Neuro exam is essentially unchanged compared to yesterday with persistent encephalopathy and nuchal rigidity. No focal deficit.  Recommendations  - I ordered MRI C, T, L spine without contrast to assess for  any abscess. No signs of cord compression on exam. - rEEG is pending, was attempted yesterday but could not be done as patient was moving her head. No clinical seizures noted by her significant other or staff. - I ordered [place] without contrast to assess for any developing Hydrocephalus. With the degree of pleocytosis and elevated protein, some concern that her CSF flow/drainage may not be adequate. - Antimicrobials per ID. _______________________________________________________________   Thank you for the opportunity to take part in the care of this patient. If you have any further questions, please contact the neurology consultation attending.  Signed,  [place] Pager Number [phone]

## 2020-06-12 DIAGNOSIS — M4620 Osteomyelitis of vertebra, site unspecified: Secondary | ICD-10-CM | POA: Diagnosis not present

## 2020-06-12 DIAGNOSIS — B953 Streptococcus pneumoniae as the cause of diseases classified elsewhere: Secondary | ICD-10-CM | POA: Diagnosis not present

## 2020-06-12 DIAGNOSIS — G062 Extradural and subdural abscess, unspecified: Secondary | ICD-10-CM

## 2020-06-12 DIAGNOSIS — A419 Sepsis, unspecified organism: Secondary | ICD-10-CM | POA: Diagnosis not present

## 2020-06-12 DIAGNOSIS — G9340 Encephalopathy, unspecified: Secondary | ICD-10-CM | POA: Diagnosis not present

## 2020-06-12 DIAGNOSIS — D72829 Elevated white blood cell count, unspecified: Secondary | ICD-10-CM

## 2020-06-12 DIAGNOSIS — G061 Intraspinal abscess and granuloma: Secondary | ICD-10-CM

## 2020-06-12 DIAGNOSIS — R7881 Bacteremia: Secondary | ICD-10-CM | POA: Diagnosis not present

## 2020-06-12 DIAGNOSIS — G009 Bacterial meningitis, unspecified: Secondary | ICD-10-CM | POA: Diagnosis not present

## 2020-06-12 DIAGNOSIS — G001 Pneumococcal meningitis: Secondary | ICD-10-CM | POA: Diagnosis not present

## 2020-06-12 LAB — ECHOCARDIOGRAM COMPLETE
Area-P 1/2: 4.21 cm2
Height: 72 in
S' Lateral: 3.09 cm
Weight: 3919.99 oz

## 2020-06-12 LAB — CSF CULTURE W GRAM STAIN

## 2020-06-12 LAB — COMPREHENSIVE METABOLIC PANEL
ALT: 14 U/L (ref 0–44)
AST: 29 U/L (ref 15–41)
Albumin: 1.7 g/dL — ABNORMAL LOW (ref 3.5–5.0)
Alkaline Phosphatase: 133 U/L — ABNORMAL HIGH (ref 38–126)
Anion gap: 12 (ref 5–15)
BUN: 39 mg/dL — ABNORMAL HIGH (ref 6–20)
CO2: 20 mmol/L — ABNORMAL LOW (ref 22–32)
Calcium: 9 mg/dL (ref 8.9–10.3)
Chloride: 117 mmol/L — ABNORMAL HIGH (ref 98–111)
Creatinine, Ser: 1.13 mg/dL — ABNORMAL HIGH (ref 0.44–1.00)
GFR, Estimated: 57 mL/min — ABNORMAL LOW (ref >60)
Glucose, Bld: 199 mg/dL — ABNORMAL HIGH (ref 70–99)
Potassium: 4.5 mmol/L (ref 3.5–5.1)
Sodium: 149 mmol/L — ABNORMAL HIGH (ref 135–145)
Total Bilirubin: 1.4 mg/dL — ABNORMAL HIGH (ref 0.3–1.2)
Total Protein: 5.9 g/dL — ABNORMAL LOW (ref 6.5–8.1)

## 2020-06-12 LAB — TSH: TSH: 2.232 u[IU]/mL (ref 0.350–4.500)

## 2020-06-12 LAB — BLOOD GAS, ARTERIAL
Acid-base deficit: 2.7 mmol/L — ABNORMAL HIGH (ref 0.0–2.0)
Allens test (pass/fail): POSITIVE — AB
Bicarbonate: 20.1 mmol/L (ref 20.0–28.0)
FIO2: 0.21
O2 Saturation: 97.8 %
Patient temperature: 37
pCO2 arterial: 27 mmHg — ABNORMAL LOW (ref 32.0–48.0)
pH, Arterial: 7.48 — ABNORMAL HIGH (ref 7.350–7.450)
pO2, Arterial: 94 mmHg (ref 83.0–108.0)

## 2020-06-12 LAB — GLUCOSE, CAPILLARY
Glucose-Capillary: 164 mg/dL — ABNORMAL HIGH (ref 70–99)
Glucose-Capillary: 168 mg/dL — ABNORMAL HIGH (ref 70–99)
Glucose-Capillary: 189 mg/dL — ABNORMAL HIGH (ref 70–99)
Glucose-Capillary: 191 mg/dL — ABNORMAL HIGH (ref 70–99)
Glucose-Capillary: 143 mg/dL — ABNORMAL HIGH (ref 70–99)
Glucose-Capillary: 98 mg/dL (ref 70–99)

## 2020-06-12 LAB — CBC
HCT: 26.3 % — ABNORMAL LOW (ref 36.0–46.0)
Hemoglobin: 8.6 g/dL — ABNORMAL LOW (ref 12.0–15.0)
MCH: 25.4 pg — ABNORMAL LOW (ref 26.0–34.0)
MCHC: 32.7 g/dL (ref 30.0–36.0)
MCV: 77.6 fL — ABNORMAL LOW (ref 80.0–100.0)
Platelets: 232 10*3/uL (ref 150–400)
RBC: 3.39 MIL/uL — ABNORMAL LOW (ref 3.87–5.11)
RDW: 16.2 % — ABNORMAL HIGH (ref 11.5–15.5)
WBC: 17 10*3/uL — ABNORMAL HIGH (ref 4.0–10.5)
nRBC: 0 % (ref 0.0–0.2)

## 2020-06-12 LAB — VANCOMYCIN, TROUGH: Vancomycin Tr: 16 ug/mL (ref 15–20)

## 2020-06-12 MED ORDER — PANTOPRAZOLE SODIUM 40 MG IV SOLR
40.0000 mg | INTRAVENOUS | Status: DC
  Administered 2020-06-12 – 2020-06-17 (×6): 40 mg via INTRAVENOUS
  Filled 2020-06-12 (×6): qty 40

## 2020-06-12 MED ORDER — KETOROLAC TROMETHAMINE 15 MG/ML IJ SOLN
15.0000 mg | Freq: Four times a day (QID) | INTRAMUSCULAR | Status: DC | PRN
  Administered 2020-06-12 – 2020-06-17 (×11): 15 mg via INTRAVENOUS
  Filled 2020-06-12 (×13): qty 1

## 2020-06-12 NOTE — Consult Note (Signed)
Neurosurgery-New Consultation Evaluation 06/12/2020 Tonya Myers [id number]  Identifying Statement: Tonya Myers is a 55 y.o. female from [place] [id number] with meningitis  Physician Requesting Consultation:  Dr Loletha Grayer  History of Present Illness: Tonya Myers is admitted with AMS and was diagnosed with sepsis and meningitis with pneumococcus. She has a history of steroid injections and imaging was obtained of spine with concern for epidural and muscle abscess. She is on antibiotics. She was reportedly moving all extremities spontaneously.   On my exam, she awakens but does not cooperate fully with exam. She will not respond to questions regarding symptoms.   Past Medical History:  Past Medical History:  Diagnosis Date  . Acute hemorrhoid 03/11/2015  . Anxiety   . Arthritis   . Asthma   . Cancer ([place])   . Chronic back pain   . CLL (chronic lymphocytic leukemia) ([place])   . Depression   . Diabetes mellitus without complication ([place])   . Genital herpes    type 2  . Hypertension   . Hypothyroidism   . Vertigo     Social History: Social History   Socioeconomic History  . Marital status: Single    Spouse name: Not on file  . Number of children: Not on file  . Years of education: Not on file  . Highest education level: Not on file  Occupational History  . Not on file  Tobacco Use  . Smoking status: Never Smoker  . Smokeless tobacco: Never Used  Vaping Use  . Vaping Use: Never used  Substance and Sexual Activity  . Alcohol use: Yes    Alcohol/week: 1.0 standard drink    Types: 1 Cans of beer per week  . Drug use: No  . Sexual activity: Not Currently    Birth control/protection: None  Other Topics Concern  . Not on file  Social History Narrative  . Not on file   Social Determinants of Health   Financial Resource Strain: Not on file  Food Insecurity: Not on file  Transportation Needs: Not on file  Physical Activity: Not on file  Stress: Not on  file  Social Connections: Not on file  Intimate Partner Violence: Not on file    Family History: Family History  Problem Relation Age of Onset  . Cancer Paternal Aunt   . Breast cancer Maternal Aunt 70    Review of Systems:  Review of Systems - General ROS: Negative Psychological ROS: Negative Ophthalmic ROS: Negative ENT ROS: Negative Hematological and Lymphatic ROS: Negative  Endocrine ROS: Negative Respiratory ROS: Negative Cardiovascular ROS: Negative Gastrointestinal ROS: Negative Genito-Urinary ROS: Negative Musculoskeletal ROS: Negative Neurological ROS: Positive for AMS Dermatological ROS: Negative  Physical Exam: BP (!) 102/42   Pulse 65   Temp 98.1 F (36.7 C) (Axillary)   Resp (!) 26   Ht 6' (1.829 m)   Wt 111.1 kg   SpO2 100%   BMI 33.23 kg/m  Body mass index is 33.23 kg/m. Body surface area is 2.38 meters squared. General appearance: Alert, not cooperative Head: Normocephalic, atraumatic Eyes: Normal Ext: No edema in LE bilaterally  Neurologic exam:  Mental status: confused, mumbles Motor:moves all extremities spontaneously, wiggles feet bilaterally to command Sensory: intact to painful stimuli in legs   Imaging: MRI Total Spine: MR findings consistent with septic arthritis involving the left L2-3 and L3-4 facet joints with associated abscess is in the left paraspinal muscles. 2. Suspect small epidural abscess posteriorly at L2-3 on the left Side. 3. No definite  MR findings for discitis or osteomyelitis in the cervical, thoracic or lumbar spines.4. Degenerative cervical spondylosis with multilevel disc disease and facet disease. Multilevel disc protrusions most significant at C3-4.   Impression/Plan:  Tonya Myers is here with sepsis and meningitis. Imaging of spine does show some areas of infection but no focal neurological deficits. Given diffuse nature, I do not see utility of draining the facet infection and the muscle is not focal either.  Recommend continued treatment with antibiotics and follow up MRI on 1-2 weeks for re-evaluation   1.  Diagnosis: Sepsis , facet infection  2.  Plan - No surgery at this time - Please re-consult if neurological changes or worsened infection on imaging despite antibiotics

## 2020-06-12 NOTE — Progress Notes (Addendum)
0800 Patient approached for assessment. Patient tolerated head to toe assessment without issues. Attempted to give mouth care. Patient tried to get out of bed. Screamed " You are trying to [facility] me!!" Held in bed by nurse tech. Wide eyed and very irrational behavior. Only knew her name. Given prn Fentanyl for pain and Haldol to calm patient. Patient calmed after 15 minutes of quiet observation and comfort from nurse tech. MAE without difficulties but would not follow commands even after she calmed down. Speech very delayed and difficult. 0830 Resting more. Noted to have events of tremors or slight jerking- not rhythmic like seizures. Breathing with mouth open-100% on room air.

## 2020-06-12 NOTE — Progress Notes (Signed)
Patient ID: Tonya Myers, female   DOB: 10-Mar-1966, 55 y.o.   MRN: [id number] [facility] PROGRESS NOTE  Tonya Myers TML:[id number] DOB: [birth year] DOA: 06/09/2020 PCP: [name], MD  HPI/Subjective: Patient seen this morning was yelling out and moving around in the bed.  She was moving all of her extremities.  She was yelling out help me.  We were right there and trying to help out.  Patient still agitated.  Objective: Vitals:   06/12/20 0500 06/12/20 0600  BP: 114/73 140/67  Pulse:    Resp: (!) 23 (!) 24  Temp:    SpO2:      Intake/Output Summary (Last 24 hours) at 06/12/2020 0823 Last data filed at 06/12/2020 0600 Gross per 24 hour  Intake 2086.83 ml  Output 895 ml  Net 1191.83 ml   Filed Weights   06/09/20 1905  Weight: 111.1 kg    ROS: Review of Systems  Unable to perform ROS: Acuity of condition   Exam: Physical Exam HENT:     Head: Normocephalic.  Eyes:     General: Lids are normal.     Conjunctiva/sclera: Conjunctivae normal.  Cardiovascular:     Rate and Rhythm: Normal rate and regular rhythm.     Heart sounds: Normal heart sounds, S1 normal and S2 normal.  Pulmonary:     Breath sounds: No decreased breath sounds, wheezing, rhonchi or rales.  Abdominal:     Palpations: Abdomen is soft.     Tenderness: There is no abdominal tenderness.  Musculoskeletal:     Right lower leg: Swelling present.     Left lower leg: Swelling present.  Skin:    General: Skin is warm.     Findings: No rash.  Neurological:     Mental Status: She is disoriented.     Comments: Patient was moving the lower extremities this morning and was very strong and grabbing my hands and pulling.       Data Reviewed: Basic Metabolic Panel: Recent Labs  Lab 06/09/20 1157 06/09/20 2037 06/10/20 0600 06/11/20 0316  NA 137 133* 139 145  K 4.1 4.7 3.7 3.7  CL 104 100 106 113*  CO2 17* 18* 21* 21*  GLUCOSE 274* 455* 191* 308*  BUN 20 30* 24* 20  CREATININE 1.35*  1.56* 1.17* 1.01*  CALCIUM 9.3 9.2 9.1 8.9  MG 1.8  --   --   --   PHOS 3.8  --   --   --    Liver Function Tests: Recent Labs  Lab 06/09/20 1157 06/09/20 2037 06/10/20 0600  AST 50* 44* 39  ALT 36 31 27  ALKPHOS 200* 211* 204*  BILITOT 2.1* 2.2* 2.4*  PROT 7.3 7.6 7.2  ALBUMIN 2.8* 2.8* 2.4*   Recent Labs  Lab 06/09/20 1157  LIPASE 29   Recent Labs  Lab 06/09/20 2145  AMMONIA 14   CBC: Recent Labs  Lab 06/09/20 1157 06/09/20 2037 06/10/20 0600 06/11/20 0316  WBC 15.5* 16.0* 12.2* 12.5*  HGB 10.1* 10.1* 12.3 10.8*  HCT 31.4* 31.0* 37.1 31.9*  MCV 80.1 79.3* 78.1* 77.1*  PLT 330 369 286 272    CBG: Recent Labs  Lab 06/11/20 1215 06/11/20 1541 06/11/20 2017 06/11/20 2336 06/12/20 0401  GLUCAP 263* 209* 186* 178* 164*    Recent Results (from the past 240 hour(s))  Resp Panel by RT-PCR (Flu A&B, Covid) Nasopharyngeal Swab     Status: None   Collection Time: 06/09/20  9:45 PM   Specimen: Nasopharyngeal  Swab; Nasopharyngeal(NP) swabs in vial transport medium  Result Value Ref Range Status   SARS Coronavirus 2 by RT PCR NEGATIVE NEGATIVE Final    Comment: (NOTE) SARS-CoV-2 target nucleic acids are NOT DETECTED.  The SARS-CoV-2 RNA is generally detectable in upper respiratory specimens during the acute phase of infection. The lowest concentration of SARS-CoV-2 viral copies this assay can detect is 138 copies/mL. A negative result does not preclude SARS-Cov-2 infection and should not be used as the sole basis for treatment or other patient management decisions. A negative result may occur with  improper specimen collection/handling, submission of specimen other than nasopharyngeal swab, presence of viral mutation(s) within the areas targeted by this assay, and inadequate number of viral copies(<138 copies/mL). A negative result must be combined with clinical observations, patient history, and epidemiological information. The expected result is  Negative.  Fact Sheet for Patients:  EntrepreneurPulse.com.au  Fact Sheet for Healthcare Providers:  IncredibleEmployment.be  This test is no t yet approved or cleared by the [place] FDA and  has been authorized for detection and/or diagnosis of SARS-CoV-2 by FDA under an Emergency Use Authorization (EUA). This EUA will remain  in effect (meaning this test can be used) for the duration of the COVID-19 declaration under Section 564(b)(1) of the Act, 21 U.S.C.section 360bbb-3(b)(1), unless the authorization is terminated  or revoked sooner.       Influenza A by PCR NEGATIVE NEGATIVE Final   Influenza B by PCR NEGATIVE NEGATIVE Final    Comment: (NOTE) The Xpert Xpress SARS-CoV-2/FLU/RSV plus assay is intended as an aid in the diagnosis of influenza from Nasopharyngeal swab specimens and should not be used as a sole basis for treatment. Nasal washings and aspirates are unacceptable for Xpert Xpress SARS-CoV-2/FLU/RSV testing.  Fact Sheet for Patients: EntrepreneurPulse.com.au  Fact Sheet for Healthcare Providers: IncredibleEmployment.be  This test is not yet approved or cleared by the [place] FDA and has been authorized for detection and/or diagnosis of SARS-CoV-2 by FDA under an Emergency Use Authorization (EUA). This EUA will remain in effect (meaning this test can be used) for the duration of the COVID-19 declaration under Section 564(b)(1) of the Act, 21 U.S.C. section 360bbb-3(b)(1), unless the authorization is terminated or revoked.  Performed at [facility], [address] [place], [place] [address]   CULTURE, BLOOD (ROUTINE X 2) w Reflex to ID Panel     Status: None (Preliminary result)   Collection Time: 06/10/20  9:40 AM   Specimen: BLOOD  Result Value Ref Range Status   Specimen Description BLOOD BLOOD LEFT FOREARM  Final   Special Requests   Final    BOTTLES DRAWN  AEROBIC AND ANAEROBIC Blood Culture adequate volume   Culture  Setup Time   Final    Organism ID to follow IN BOTH AEROBIC AND ANAEROBIC BOTTLES GRAM POSITIVE COCCI CRITICAL RESULT CALLED TO, READ BACK BY AND VERIFIED WITH: [place] @1941  ON 06/10/20 SKL Performed at [facility], [address], [place], [place] [address]    Culture GRAM POSITIVE COCCI  Final   Report Status PENDING  Incomplete  Blood Culture ID Panel (Reflexed)     Status: Abnormal   Collection Time: 06/10/20  9:40 AM  Result Value Ref Range Status   Enterococcus faecalis NOT DETECTED NOT DETECTED Final   Enterococcus Faecium NOT DETECTED NOT DETECTED Final   Listeria monocytogenes NOT DETECTED NOT DETECTED Final   Staphylococcus species NOT DETECTED NOT DETECTED Final   Staphylococcus aureus (BCID)  NOT DETECTED NOT DETECTED Final   Staphylococcus epidermidis NOT DETECTED NOT DETECTED Final   Staphylococcus lugdunensis NOT DETECTED NOT DETECTED Final   Streptococcus species DETECTED (A) NOT DETECTED Final    Comment: CRITICAL RESULT CALLED TO, READ BACK BY AND VERIFIED WITH: SUSAN WATSON @1941  ON 06/10/20 SKL    Streptococcus agalactiae NOT DETECTED NOT DETECTED Final   Streptococcus pneumoniae DETECTED (A) NOT DETECTED Final    Comment: CRITICAL RESULT CALLED TO, READ BACK BY AND VERIFIED WITH: SUSAN WATSON @1941  ON 06/10/20 SKL    Streptococcus pyogenes NOT DETECTED NOT DETECTED Final   A.calcoaceticus-baumannii NOT DETECTED NOT DETECTED Final   Bacteroides fragilis NOT DETECTED NOT DETECTED Final   Enterobacterales NOT DETECTED NOT DETECTED Final   Enterobacter cloacae complex NOT DETECTED NOT DETECTED Final   Escherichia coli NOT DETECTED NOT DETECTED Final   Klebsiella aerogenes NOT DETECTED NOT DETECTED Final   Klebsiella oxytoca NOT DETECTED NOT DETECTED Final   Klebsiella pneumoniae NOT DETECTED NOT DETECTED Final   Proteus species NOT DETECTED NOT DETECTED Final   Salmonella species NOT  DETECTED NOT DETECTED Final   Serratia marcescens NOT DETECTED NOT DETECTED Final   Haemophilus influenzae NOT DETECTED NOT DETECTED Final   Neisseria meningitidis NOT DETECTED NOT DETECTED Final   Pseudomonas aeruginosa NOT DETECTED NOT DETECTED Final   Stenotrophomonas maltophilia NOT DETECTED NOT DETECTED Final   Candida albicans NOT DETECTED NOT DETECTED Final   Candida auris NOT DETECTED NOT DETECTED Final   Candida glabrata NOT DETECTED NOT DETECTED Final   Candida krusei NOT DETECTED NOT DETECTED Final   Candida parapsilosis NOT DETECTED NOT DETECTED Final   Candida tropicalis NOT DETECTED NOT DETECTED Final   Cryptococcus neoformans/gattii NOT DETECTED NOT DETECTED Final    Comment: Performed at [facility], [address] [place], [place] [address]  CULTURE, BLOOD (ROUTINE X 2) w Reflex to ID Panel     Status: None (Preliminary result)   Collection Time: 06/10/20 11:26 AM   Specimen: BLOOD  Result Value Ref Range Status   Specimen Description BLOOD LEFT ARM  Final   Special Requests   Final    BOTTLES DRAWN AEROBIC ONLY Blood Culture adequate volume   Culture   Final    NO GROWTH 2 DAYS Performed at [facility], [address] [place], [place] [address]    Report Status PENDING  Incomplete  CSF culture w Stat Gram Stain     Status: None (Preliminary result)   Collection Time: 06/10/20  2:19 PM   Specimen: CSF; Cerebrospinal Fluid  Result Value Ref Range Status   Specimen Description   Final    CSF Performed at [facility], [address], [place], [place] [address]    Special Requests   Final    NONE Performed at [facility], [address] [place], [place] [address]    Gram Stain   Final    GRAM POSITIVE DIPLOCOCCI CRITICAL RESULT CALLED TO, READ BACK BY AND VERIFIED WITH: KORY ANDREWS @1627  06/10/20 MJU WBC SEEN RBC SEEN Performed at [facility], [address] [place], [place] [address]    Culture  MODERATE STREPTOCOCCUS PNEUMONIAE  Final   Report Status PENDING  Incomplete  MRSA PCR Screening     Status: None   Collection Time: 06/11/20  4:56 AM   Specimen: Nasopharyngeal  Result Value Ref Range Status   MRSA by PCR NEGATIVE NEGATIVE Final    Comment:  The GeneXpert MRSA Assay (FDA approved for NASAL specimens only), is one component of a comprehensive MRSA colonization surveillance program. It is not intended to diagnose MRSA infection nor to guide or monitor treatment for MRSA infections. Performed at [facility], [address] [place], [place] [address]   Culture, blood (Routine X 2) w Reflex to ID Panel     Status: None (Preliminary result)   Collection Time: 06/12/20 12:41 AM   Specimen: BLOOD  Result Value Ref Range Status   Specimen Description BLOOD BLOOD RIGHT HAND  Final   Special Requests   Final    BOTTLES DRAWN AEROBIC AND ANAEROBIC Blood Culture adequate volume   Culture   Final    NO GROWTH < 12 HOURS Performed at [facility], [address], [place], [place] [address]    Report Status PENDING  Incomplete  Culture, blood (Routine X 2) w Reflex to ID Panel     Status: None (Preliminary result)   Collection Time: 06/12/20 12:41 AM   Specimen: BLOOD  Result Value Ref Range Status   Specimen Description BLOOD BLOOD LEFT HAND  Final   Special Requests IN PEDIATRIC BOTTLE Blood Culture adequate volume  Final   Culture   Final    NO GROWTH < 12 HOURS Performed at [facility], [address], [place], [place] [address]    Report Status PENDING  Incomplete     Studies: CT HEAD WO CONTRAST  Result Date: 06/11/2020 CLINICAL DATA:  Sepsis.  Meningitis hydrocephalus. EXAM: CT HEAD WITHOUT CONTRAST TECHNIQUE: Contiguous axial images were obtained from the base of the skull through the vertex without intravenous contrast. COMPARISON:  Head CT 06/09/2020.  MRI 06/10/2020. FINDINGS: Brain: The brain shows a normal appearance  without evidence of malformation, atrophy, old or acute small or large vessel infarction, mass lesion, hemorrhage, hydrocephalus or extra-axial collection. Vascular: No hyperdense vessel. No evidence of atherosclerotic calcification. Skull: Normal.  No traumatic finding.  No focal bone lesion. Sinuses/Orbits: Sinuses are clear. Orbits appear normal. Mastoids are clear. Other: None significant IMPRESSION: Normal head CT. Electronically Signed   By: Nelson Chimes M.D.   On: 06/11/2020 15:47   MR CERVICAL SPINE WO CONTRAST  Result Date: 06/11/2020 CLINICAL DATA:  Pneumococcal meningitis. Neck and back pain. Evaluate for epidural abscess. EXAM: MRI CERVICAL, THORACIC AND LUMBAR SPINE WITHOUT CONTRAST TECHNIQUE: Multiplanar and multiecho pulse sequences of the cervical spine, to include the craniocervical junction and cervicothoracic junction, and thoracic and lumbar spine, were obtained without intravenous contrast. COMPARISON:  None FINDINGS: MRI CERVICAL SPINE FINDINGS Limited examination due to significant patient motion. Alignment: Normal alignment. Vertebrae: Grossly normal marrow signal. No bone lesions or fractures. No findings suspicious for discitis or osteomyelitis. Cord: Grossly normal cord signal intensity. No cord lesion or syrinx. Posterior Fossa, vertebral arteries, paraspinal tissues: No significant findings. Disc levels: C2-3: Shallow broad-based disc protrusion with mass effect on the ventral thecal sac. No significant foraminal stenosis. C3-4: Large central and left paracentral disc protrusion with mass effect on the thecal sac and flattening of the cervical cord. Mild foraminal stenosis bilaterally also. C4-5: Bulging degenerated annulus, osteophytic ridging and left paracentral disc protrusion with mass effect on the thecal sac. There is also mild bilateral foraminal stenosis. C5-6: Degenerated bulging annulus, central disc protrusion and facet disease with mild bilateral foraminal stenosis.  C6-7: Bulging degenerated annulus and osteophytic ridging with flattening of the ventral thecal sac asymmetric left. Mild left foraminal stenosis. C7-T1: Bulging degenerated annulus and  left paracentral disc protrusion with mild mass effect on the left side of thecal sac. No significant foraminal stenosis. MRI THORACIC SPINE FINDINGS Alignment:  Normal alignment of the thoracic vertebral bodies. Vertebrae: Normal marrow signal. Small scattered hemangiomas but no worrisome bone lesions or fractures. No findings suspicious for discitis or osteomyelitis. The facets are normally aligned. No facet or laminar fractures. No evidence of facet septic arthritis. Cord: Grossly normal thoracic spinal cord. No obvious cord lesions or syrinx. Paraspinal and other soft tissues: No significant paraspinal findings. Disc levels: Small central disc protrusion at T6-7. MRI LUMBAR SPINE FINDINGS Segmentation: There are five lumbar type vertebral bodies. The last full intervertebral disc space is labeled L5-S1. Alignment:  Normal Vertebrae: No bone lesions or fractures. No findings suspicious for septic arthritis or osteomyelitis. Endplate reactive changes at L5-S1. Significant facet arthropathy noted on the left side at L2-3 and L3-4 with findings consistent with septic arthritis. Associated abscesses in the paraspinal muscles associated with both these levels. There is also significant surrounding myositis. Findings suspicious for a small epidural abscess on the left side at C2-3 associated with the infected left facet joint. No significant mass effect on the thecal sac. Conus medullaris and cauda equina: Conus extends to the L1-2 level. Conus and cauda equina appear normal. Paraspinal and other soft tissues: Abscesses in the left paraspinal muscles at L2, L3 and L4. Disc levels: L1-2: No significant findings. L2-3: No findings for discitis or osteomyelitis. Small posterior epidural abscess on the left side associated with the septic  arthritis in the left L2-3 facet joint. No disc protrusions, spinal or foraminal stenosis. L3-4: No disc protrusions, spinal or foraminal stenosis. Septic arthritis involving the left L3-4 facet joint with an adjacent soft tissue abscess measuring approximately 18 x 15 mm. Smaller adjacent abscesses are also noted in the left paraspinal muscle. L4-5: No evidence of discitis or osteomyelitis. There is a shallow central disc protrusion with mass effect on the ventral thecal sac and mild bilateral lateral recess stenosis. No foraminal stenosis. The facet joints are intact at this level. There are some small abscesses in the left paraspinal muscle extending down just below the L4-5 disc space. L5-S1: Degenerative disc disease with endplate reactive changes. There is a shallow broad-based disc protrusion asymmetric left with mass effect on the thecal sac and on the left S1 nerve root. No foraminal stenosis. No septic arthritis involving the facet joints. IMPRESSION: 1. MR findings consistent with septic arthritis involving the left L2-3 and L3-4 facet joints with associated abscess is in the left paraspinal muscles. 2. Suspect small epidural abscess posteriorly at L2-3 on the left side. 3. No definite MR findings for discitis or osteomyelitis in the cervical, thoracic or lumbar spines. 4. Degenerative cervical spondylosis with multilevel disc disease and facet disease. Multilevel disc protrusions most significant at C3-4. Electronically Signed   By: Marijo Sanes M.D.   On: 06/11/2020 15:06   MR THORACIC SPINE WO CONTRAST  Result Date: 06/11/2020 CLINICAL DATA:  Pneumococcal meningitis. Neck and back pain. Evaluate for epidural abscess. EXAM: MRI CERVICAL, THORACIC AND LUMBAR SPINE WITHOUT CONTRAST TECHNIQUE: Multiplanar and multiecho pulse sequences of the cervical spine, to include the craniocervical junction and cervicothoracic junction, and thoracic and lumbar spine, were obtained without intravenous contrast.  COMPARISON:  None FINDINGS: MRI CERVICAL SPINE FINDINGS Limited examination due to significant patient motion. Alignment: Normal alignment. Vertebrae: Grossly normal marrow signal. No bone lesions or fractures. No findings suspicious for discitis or osteomyelitis. Cord: Grossly normal cord  signal intensity. No cord lesion or syrinx. Posterior Fossa, vertebral arteries, paraspinal tissues: No significant findings. Disc levels: C2-3: Shallow broad-based disc protrusion with mass effect on the ventral thecal sac. No significant foraminal stenosis. C3-4: Large central and left paracentral disc protrusion with mass effect on the thecal sac and flattening of the cervical cord. Mild foraminal stenosis bilaterally also. C4-5: Bulging degenerated annulus, osteophytic ridging and left paracentral disc protrusion with mass effect on the thecal sac. There is also mild bilateral foraminal stenosis. C5-6: Degenerated bulging annulus, central disc protrusion and facet disease with mild bilateral foraminal stenosis. C6-7: Bulging degenerated annulus and osteophytic ridging with flattening of the ventral thecal sac asymmetric left. Mild left foraminal stenosis. C7-T1: Bulging degenerated annulus and left paracentral disc protrusion with mild mass effect on the left side of thecal sac. No significant foraminal stenosis. MRI THORACIC SPINE FINDINGS Alignment:  Normal alignment of the thoracic vertebral bodies. Vertebrae: Normal marrow signal. Small scattered hemangiomas but no worrisome bone lesions or fractures. No findings suspicious for discitis or osteomyelitis. The facets are normally aligned. No facet or laminar fractures. No evidence of facet septic arthritis. Cord: Grossly normal thoracic spinal cord. No obvious cord lesions or syrinx. Paraspinal and other soft tissues: No significant paraspinal findings. Disc levels: Small central disc protrusion at T6-7. MRI LUMBAR SPINE FINDINGS Segmentation: There are five lumbar type  vertebral bodies. The last full intervertebral disc space is labeled L5-S1. Alignment:  Normal Vertebrae: No bone lesions or fractures. No findings suspicious for septic arthritis or osteomyelitis. Endplate reactive changes at L5-S1. Significant facet arthropathy noted on the left side at L2-3 and L3-4 with findings consistent with septic arthritis. Associated abscesses in the paraspinal muscles associated with both these levels. There is also significant surrounding myositis. Findings suspicious for a small epidural abscess on the left side at C2-3 associated with the infected left facet joint. No significant mass effect on the thecal sac. Conus medullaris and cauda equina: Conus extends to the L1-2 level. Conus and cauda equina appear normal. Paraspinal and other soft tissues: Abscesses in the left paraspinal muscles at L2, L3 and L4. Disc levels: L1-2: No significant findings. L2-3: No findings for discitis or osteomyelitis. Small posterior epidural abscess on the left side associated with the septic arthritis in the left L2-3 facet joint. No disc protrusions, spinal or foraminal stenosis. L3-4: No disc protrusions, spinal or foraminal stenosis. Septic arthritis involving the left L3-4 facet joint with an adjacent soft tissue abscess measuring approximately 18 x 15 mm. Smaller adjacent abscesses are also noted in the left paraspinal muscle. L4-5: No evidence of discitis or osteomyelitis. There is a shallow central disc protrusion with mass effect on the ventral thecal sac and mild bilateral lateral recess stenosis. No foraminal stenosis. The facet joints are intact at this level. There are some small abscesses in the left paraspinal muscle extending down just below the L4-5 disc space. L5-S1: Degenerative disc disease with endplate reactive changes. There is a shallow broad-based disc protrusion asymmetric left with mass effect on the thecal sac and on the left S1 nerve root. No foraminal stenosis. No septic  arthritis involving the facet joints. IMPRESSION: 1. MR findings consistent with septic arthritis involving the left L2-3 and L3-4 facet joints with associated abscess is in the left paraspinal muscles. 2. Suspect small epidural abscess posteriorly at L2-3 on the left side. 3. No definite MR findings for discitis or osteomyelitis in the cervical, thoracic or lumbar spines. 4. Degenerative cervical spondylosis with multilevel  disc disease and facet disease. Multilevel disc protrusions most significant at C3-4. Electronically Signed   By: Marijo Sanes M.D.   On: 06/11/2020 15:06   MR LUMBAR SPINE WO CONTRAST  Result Date: 06/11/2020 CLINICAL DATA:  Pneumococcal meningitis. Neck and back pain. Evaluate for epidural abscess. EXAM: MRI CERVICAL, THORACIC AND LUMBAR SPINE WITHOUT CONTRAST TECHNIQUE: Multiplanar and multiecho pulse sequences of the cervical spine, to include the craniocervical junction and cervicothoracic junction, and thoracic and lumbar spine, were obtained without intravenous contrast. COMPARISON:  None FINDINGS: MRI CERVICAL SPINE FINDINGS Limited examination due to significant patient motion. Alignment: Normal alignment. Vertebrae: Grossly normal marrow signal. No bone lesions or fractures. No findings suspicious for discitis or osteomyelitis. Cord: Grossly normal cord signal intensity. No cord lesion or syrinx. Posterior Fossa, vertebral arteries, paraspinal tissues: No significant findings. Disc levels: C2-3: Shallow broad-based disc protrusion with mass effect on the ventral thecal sac. No significant foraminal stenosis. C3-4: Large central and left paracentral disc protrusion with mass effect on the thecal sac and flattening of the cervical cord. Mild foraminal stenosis bilaterally also. C4-5: Bulging degenerated annulus, osteophytic ridging and left paracentral disc protrusion with mass effect on the thecal sac. There is also mild bilateral foraminal stenosis. C5-6: Degenerated bulging  annulus, central disc protrusion and facet disease with mild bilateral foraminal stenosis. C6-7: Bulging degenerated annulus and osteophytic ridging with flattening of the ventral thecal sac asymmetric left. Mild left foraminal stenosis. C7-T1: Bulging degenerated annulus and left paracentral disc protrusion with mild mass effect on the left side of thecal sac. No significant foraminal stenosis. MRI THORACIC SPINE FINDINGS Alignment:  Normal alignment of the thoracic vertebral bodies. Vertebrae: Normal marrow signal. Small scattered hemangiomas but no worrisome bone lesions or fractures. No findings suspicious for discitis or osteomyelitis. The facets are normally aligned. No facet or laminar fractures. No evidence of facet septic arthritis. Cord: Grossly normal thoracic spinal cord. No obvious cord lesions or syrinx. Paraspinal and other soft tissues: No significant paraspinal findings. Disc levels: Small central disc protrusion at T6-7. MRI LUMBAR SPINE FINDINGS Segmentation: There are five lumbar type vertebral bodies. The last full intervertebral disc space is labeled L5-S1. Alignment:  Normal Vertebrae: No bone lesions or fractures. No findings suspicious for septic arthritis or osteomyelitis. Endplate reactive changes at L5-S1. Significant facet arthropathy noted on the left side at L2-3 and L3-4 with findings consistent with septic arthritis. Associated abscesses in the paraspinal muscles associated with both these levels. There is also significant surrounding myositis. Findings suspicious for a small epidural abscess on the left side at C2-3 associated with the infected left facet joint. No significant mass effect on the thecal sac. Conus medullaris and cauda equina: Conus extends to the L1-2 level. Conus and cauda equina appear normal. Paraspinal and other soft tissues: Abscesses in the left paraspinal muscles at L2, L3 and L4. Disc levels: L1-2: No significant findings. L2-3: No findings for discitis or  osteomyelitis. Small posterior epidural abscess on the left side associated with the septic arthritis in the left L2-3 facet joint. No disc protrusions, spinal or foraminal stenosis. L3-4: No disc protrusions, spinal or foraminal stenosis. Septic arthritis involving the left L3-4 facet joint with an adjacent soft tissue abscess measuring approximately 18 x 15 mm. Smaller adjacent abscesses are also noted in the left paraspinal muscle. L4-5: No evidence of discitis or osteomyelitis. There is a shallow central disc protrusion with mass effect on the ventral thecal sac and mild bilateral lateral recess stenosis. No foraminal stenosis.  The facet joints are intact at this level. There are some small abscesses in the left paraspinal muscle extending down just below the L4-5 disc space. L5-S1: Degenerative disc disease with endplate reactive changes. There is a shallow broad-based disc protrusion asymmetric left with mass effect on the thecal sac and on the left S1 nerve root. No foraminal stenosis. No septic arthritis involving the facet joints. IMPRESSION: 1. MR findings consistent with septic arthritis involving the left L2-3 and L3-4 facet joints with associated abscess is in the left paraspinal muscles. 2. Suspect small epidural abscess posteriorly at L2-3 on the left side. 3. No definite MR findings for discitis or osteomyelitis in the cervical, thoracic or lumbar spines. 4. Degenerative cervical spondylosis with multilevel disc disease and facet disease. Multilevel disc protrusions most significant at C3-4. Electronically Signed   By: Marijo Sanes M.D.   On: 06/11/2020 15:06   ECHOCARDIOGRAM COMPLETE  Result Date: 06/12/2020    ECHOCARDIOGRAM REPORT   Patient Name:   Tonya Myers Date of Exam: 06/11/2020 Medical Rec #:  [id number]       Height:       72.0 in Accession #:    [id number]      Weight:       245.0 lb Date of Birth:  [birth year]       BSA:          2.322 m Patient Age:    33 years        BP:            126/73 mmHg Patient Gender: F               HR:           75 bpm. Exam Location:  [facility] Procedure: 2D Echo, Cardiac Doppler and Color Doppler Indications:     R78.81 Bacteremia  History:         Patient has no prior history of Echocardiogram examinations.                  Risk Factors:Hypertension and Diabetes. Hypothyroidism.  Sonographer:     Wilford Sports Rodgers-Jones Referring Phys:  [id number] [name] Diagnosing Phys: Ida Rogue MD IMPRESSIONS  1. Left ventricular ejection fraction, by estimation, is 50 to 55%. The left ventricle has low normal function. The left ventricle has no regional wall motion abnormalities. There is mild left ventricular hypertrophy. Left ventricular diastolic parameters were normal.  2. Right ventricular systolic function is normal. The right ventricular size is normal. There is normal pulmonary artery systolic pressure. The estimated right ventricular systolic pressure is 30.8 mmHg.  3. The mitral valve is normal in structure. Mild mitral valve regurgitation. No evidence of mitral stenosis.  4. The aortic valve is grossly normal in structure, though difficult to fully visualize. Aortic valve regurgitation is not visualized. No aortic stenosis is present.  5. Grossly, no valve vegetation noted. FINDINGS  Left Ventricle: Left ventricular ejection fraction, by estimation, is 50 to 55%. The left ventricle has low normal function. The left ventricle has no regional wall motion abnormalities. The left ventricular internal cavity size was normal in size. There is mild left ventricular hypertrophy. Left ventricular diastolic parameters were normal. Right Ventricle: The right ventricular size is normal. No increase in right ventricular wall thickness. Right ventricular systolic function is normal. There is normal pulmonary artery systolic pressure. The tricuspid regurgitant velocity is 2.13 m/s, and  with an assumed right atrial pressure of 5 mmHg, the estimated right ventricular  systolic pressure is 76.7 mmHg. Left Atrium: Left atrial size was normal in size. Right Atrium: Right atrial size was normal in size. Pericardium: There is no evidence of pericardial effusion. Mitral Valve: The mitral valve is normal in structure. Mild mitral valve regurgitation. No evidence of mitral valve stenosis. Tricuspid Valve: The tricuspid valve is normal in structure. Tricuspid valve regurgitation is mild . No evidence of tricuspid stenosis. Aortic Valve: The aortic valve is normal in structure. Aortic valve regurgitation is not visualized. No aortic stenosis is present. Pulmonic Valve: The pulmonic valve was normal in structure. Pulmonic valve regurgitation is not visualized. No evidence of pulmonic stenosis. Aorta: The aortic root is normal in size and structure. Venous: The inferior vena cava is normal in size with greater than 50% respiratory variability, suggesting right atrial pressure of 3 mmHg. IAS/Shunts: No atrial level shunt detected by color flow Doppler.  LEFT VENTRICLE PLAX 2D LVIDd:         4.39 cm  Diastology LVIDs:         3.09 cm  LV e' medial:    7.40 cm/s LV PW:         1.03 cm  LV E/e' medial:  11.7 LV IVS:        1.10 cm  LV e' lateral:   9.36 cm/s LVOT diam:     1.80 cm  LV E/e' lateral: 9.3 LV SV:         56 LV SV Index:   24 LVOT Area:     2.54 cm  RIGHT VENTRICLE             IVC RV Basal diam:  3.51 cm     IVC diam: 2.46 cm RV S prime:     10.10 cm/s LEFT ATRIUM             Index       RIGHT ATRIUM           Index LA diam:        4.10 cm 1.77 cm/m  RA Area:     12.50 cm LA Vol (A2C):   63.4 ml 27.30 ml/m RA Volume:   30.60 ml  13.18 ml/m LA Vol (A4C):   39.6 ml 17.05 ml/m LA Biplane Vol: 53.7 ml 23.12 ml/m  AORTIC VALVE LVOT Vmax:   108.00 cm/s LVOT Vmean:  71.100 cm/s LVOT VTI:    0.219 m  AORTA Ao Root diam: 3.10 cm MITRAL VALVE               TRICUSPID VALVE MV Area (PHT): 4.21 cm    TR Peak grad:   18.1 mmHg MV Decel Time: 180 msec    TR Vmax:        213.00 cm/s MV E  velocity: 86.80 cm/s MV A velocity: 57.70 cm/s  SHUNTS MV E/A ratio:  1.50        Systemic VTI:  0.22 m                            Systemic Diam: 1.80 cm Ida Rogue MD Electronically signed by Ida Rogue MD Signature Date/Time: 06/12/2020/7:33:57 AM    Final    [place] EKG SITE RITE  Result Date: 06/10/2020 If Site Rite image not attached, placement could not be confirmed due to current cardiac rhythm.  DG FL GUIDED LUMBAR PUNCTURE  Result Date: 06/10/2020 CLINICAL DATA:  Concern for meningitis EXAM: DIAGNOSTIC LUMBAR PUNCTURE UNDER  FLUOROSCOPIC GUIDANCE FLUOROSCOPY TIME:  Fluoroscopy Time:  18 seconds Radiation Exposure Index (if provided by the fluoroscopic device): 4.2 mGy PROCEDURE: Informed consent was obtained from the patient prior to the procedure, including potential complications of headache, allergy, and pain. With the patient prone, the lower back was prepped with Betadine. 1% Lidocaine was used for local anesthesia. Lumbar puncture was performed at the L2-L3 level using a 22 gauge needle with return of turbid CSF. 1 ml of CSF obtained for laboratory studies. The patient tolerated the procedure well and there were no apparent complications. IMPRESSION: Technically successful fluoroscopic guided lumbar puncture. Electronically Signed   By: Macy Mis M.D.   On: 06/10/2020 15:59    Scheduled Meds: . chlorhexidine  15 mL Mouth Rinse BID  . Chlorhexidine Gluconate Cloth  6 each Topical Q0600  . insulin aspart  0-15 Units Subcutaneous Q4H  . insulin glargine  25 Units Subcutaneous Daily  . levothyroxine  150 mcg Oral Q0600  . mouth rinse  15 mL Mouth Rinse q12n4p  . sodium chloride flush  3 mL Intravenous Q12H   Continuous Infusions: . cefTRIAXone (ROCEPHIN)  IV 200 mL/hr at 06/11/20 2329  . dexmedetomidine (PRECEDEX) IV infusion 0.6 mcg/kg/hr (06/12/20 0725)  . lactated ringers 50 mL/hr (06/12/20 0727)  . vancomycin Stopped (06/12/20 0034)    Assessment/Plan:   1. Severe  pneumococcal sepsis, present on admission.  Pneumococcal meningitis.  Patient also had acute metabolic encephalopathy and acute kidney injury.  Patient's blood cultures and CSF cultures positive for pneumococcus.  Continue Rocephin and vancomycin. 2. Paraspinal abscess and small epidural abscess.  Case discussed with neurosurgeon Dr. Lacinda Axon last night and he will evaluate today but recommended treating infection and serial imaging exams. 3. Acute metabolic encephalopathy.  Patient now speaking this morning and was agitated and had to be sedated with Precedex drip. 4. DKA.  Initially was on insulin drip this is not resolved on Lantus insulin and sliding scale. 5. Acute kidney injury.  Creatinine 1.56 on 04/08/2021, creatinine up a little bit on 1.13 today. 6. Elevated liver function test secondary to sepsis.  Liver function tests are trending in the right direction. 7. Hypothyroidism unspecified. 8. Obesity with BMI of 33.23 9. History of CLL. 10. Drop in hemoglobin.  Hemoglobin 8.6 today.  Teds and SCDs for DVT prophylaxis.  Empiric Protonix.  Recheck hemoglobin tomorrow.  Hopefully just dehydration dropping the hemoglobin with IV fluids     Code Status:     Code Status Orders  (From admission, onward)         Start     Ordered   06/09/20 2313  Full code  Continuous        06/09/20 2314        Code Status History    This patient has a current code status but no historical code status.   Advance Care Planning Activity     Family Communication: Spoke with significant other on the phone Disposition Plan: Status is: Inpatient  Dispo: The patient is from: Home              Anticipated d/c is to: Hopefully home              Anticipated d/c date is: Likely will need another week in the hospital              Patient currently being treated for severe sepsis with pneumococcal meningitis with IV antibiotics.   Difficult to place patient.  Hopefully not  Time spent: 28 minutes, discussed  care with nursing staff and critical care specialist and neurology.  Last night discussed case with Dr. Lacinda Axon neurosurgery.  [place]  [facility] [employer]

## 2020-06-12 NOTE — Progress Notes (Signed)
NEUROLOGY CONSULTATION PROGRESS NOTE   Date of service: June 12, 2020 Patient Name: Tonya Myers MRN:  [id number] DOB:  [birth year]  Brief HPI  Tonya Myers is a 55 y.o. female admitted with aphasia out of proportion to encephalopathy, fever and tachycardia x 2 days. 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   Interval Hx   Very agitated, needed fetanyl and haldol. Following a lot more commands since yesterday but mentation still not close to baseline. Imaging of spine completed. See details below.  Vitals   Vitals:   06/12/20 0300 06/12/20 0400 06/12/20 0500 06/12/20 0600  BP: 107/65 127/69 114/73 140/67  Pulse:      Resp: (!) 25 15 (!) 23 (!) 24  Temp:  98.2 F (36.8 C)    TempSrc:  Axillary    SpO2:      Weight:      Height:         Body mass index is 33.23 kg/m.  Physical Exam   General: Laying in bed; in no acute distress. HENT: Normal oropharynx and mucosa. Normal external appearance of ears and nose. Dry MM Neck: No gross stiffness but has pain and tenderness on moving head passively. CV: No JVD. No peripheral edema. Pulmonary: Symmetric Chest rise. Tachypneic. Abdomen: Soft to touch, non-tender. Ext: No cyanosis, edema, or deformity Skin: No rash. Normal palpation of skin. Musculoskeletal: Normal digits and nails by inspection. No clubbing.  Neurologic Examination  Mental status/Cognition: Somnolent, opens eyes briefly, follows simple commands after long coaching - was able to look at me on command on both sides, was able to give a thumbs up. Poor attention concentration and difficult to keep engaged in conversation-needs a lot of coaching Speech/language: Minimal speech output and very dysarthric and incomprehensible. Cranial nerves:   CN II Pupils equal and reactive to light, does not consistently blink to threat BL but is able to tend to both sides consistently   CN III,IV,VI EOM intact -followed commands to look both  sides   CN V    CN VII no asymmetry, no nasolabial fold flattening   CN VIII Turns head towards speech.            Motor:  Muscle bulk: normal, tone normal Spontaneous movement in all extremities. Does not   Reflexes:2+ all over  Sensation: intact - withdraws both UE and both LE symmetrically to nox stim Coordination/Complex Motor:  Unable to assess.  Labs   Basic Metabolic Panel:  Lab Results  Component Value Date   NA 145 06/11/2020   K 3.7 06/11/2020   CO2 21 (L) 06/11/2020   GLUCOSE 308 (H) 06/11/2020   BUN 20 06/11/2020   CREATININE 1.01 (H) 06/11/2020   CALCIUM 8.9 06/11/2020   GFRNONAA >60 06/11/2020   GFRAA >60 01/17/2020   HbA1c:  Lab Results  Component Value Date   HGBA1C 8.0 (H) 06/09/2020   Imaging and Diagnostic studies  CSF Studies: Turbid CSF WBC: 63,442. RBCs: 20,290 CSF Glucose: < 20 CSF Protein: > 600  MR BRAIN WO CONTRAST Normal brain MRI.  MRI C, T, L spine w/o contrast:  IMPRESSION: 1. MR findings consistent with septic arthritis involving the left L2-3 and L3-4 facet joints with associated abscess is in the left paraspinal muscles. 2. Suspect small epidural abscess posteriorly at L2-3 on the left side. 3. No definite MR findings for discitis or osteomyelitis in the cervical, thoracic or lumbar spines. 4. Degenerative cervical spondylosis with multilevel disc disease and facet  disease. Multilevel disc protrusions most significant at C3-4.  No cord compression seen on C, T or L-spine.  Repeat CTH - normal - no evidence of hydrocephalus  Routine EEG completed last night: IMPRESSION: This study is suggestive of moderate diffuse encephalopathy, nonspecific etiology. No seizures or epileptiform discharges were seen throughout the recording.  MEDICATIONS  Current Facility-Administered Medications:  .  acetaminophen (TYLENOL) tablet 650 mg, 650 mg, Oral, Q6H PRN **OR** acetaminophen (TYLENOL) suppository 650 mg, 650 mg, Rectal,  Q6H PRN, Tonya Bruins, MD, 650 mg at 06/11/20 0422 .  albuterol (VENTOLIN HFA) 108 (90 Base) MCG/ACT inhaler 1 puff, 1 puff, Inhalation, Q6H PRN, Tonya Bruins, MD .  cefTRIAXone (ROCEPHIN) 2 g in sodium chloride 0.9 % 100 mL IVPB, 2 g, Intravenous, Q12H, Ravishankar, Jayashree, MD, Last Rate: 200 mL/hr at 06/11/20 2329, Infusion Verify at 06/11/20 2329 .  chlorhexidine (PERIDEX) 0.12 % solution 15 mL, 15 mL, Mouth Rinse, BID, Tonya Settler, NP, 15 mL at 06/11/20 2254 .  Chlorhexidine Gluconate Cloth 2 % PADS 6 each, 6 each, Topical, Q0600, Tonya Settler, NP, 6 each at 06/12/20 [phone] .  dexmedetomidine (PRECEDEX) 400 MCG/100ML (4 mcg/mL) infusion, 0.4-1.2 mcg/kg/hr, Intravenous, Titrated, Tonya Settler, NP, Last Rate: 16.67 mL/hr at 06/12/20 0725, 0.6 mcg/kg/hr at 06/12/20 0725 .  dextrose 50 % solution 0-50 mL, 0-50 mL, Intravenous, PRN, Tonya Bruins, MD .  fentaNYL (SUBLIMAZE) injection 12.5 mcg, 12.5 mcg, Intravenous, Q2H PRN, Tonya Settler, NP, 12.5 mcg at 06/12/20 0749 .  haloperidol lactate (HALDOL) injection 2 mg, 2 mg, Intravenous, Q6H PRN, Tonya Bruins, MD, 2 mg at 06/12/20 0751 .  insulin aspart (novoLOG) injection 0-15 Units, 0-15 Units, Subcutaneous, Q4H, Tonya Settler, NP, 3 Units at 06/12/20 0418 .  insulin glargine (LANTUS) injection 25 Units, 25 Units, Subcutaneous, Daily, Tonya Grayer, MD, 25 Units at 06/11/20 1543 .  labetalol (NORMODYNE) injection 10 mg, 10 mg, Intravenous, Q2H PRN, Tonya Settler, NP, 10 mg at 06/11/20 0236 .  lactated ringers infusion, , Intravenous, Continuous, Wieting, Richard, MD, Last Rate: 50 mL/hr at 06/12/20 0727, 50 mL/hr at 06/12/20 0727 .  levothyroxine (SYNTHROID) tablet 150 mcg, 150 mcg, Oral, Q0600, Tonya Bruins, MD .  MEDLINE mouth rinse, 15 mL, Mouth Rinse, q12n4p, Tonya Settler, NP, 15 mL at 06/11/20 1544 .  polyethylene glycol (MIRALAX / GLYCOLAX) packet 17 g, 17 g, Oral, Daily PRN, Tonya Seat B, MD .  sodium chloride flush (NS) 0.9 % injection 3 mL, 3 mL, Intravenous, Q12H, Tonya Bruins, MD, 3 mL at 06/11/20 2254 .  vancomycin (VANCOCIN) IVPB 1000 mg/200 mL premix, 1,000 mg, Intravenous, Q12H, Tonya Myers, RPH, Stopped at 06/12/20 0034   Impression   Tonya Myers is a 55 y.o. female admitted with Pneumococcal Meningitis and Sepsis. Neuro exam with moderate to severe encephalopathy but somewhat improved from previous documented exams - follows some commands although continues to have poor attention/concentration and level of wakefulness. MR whole spine c/w septic arthritis of lumbar spine with associated paraspinal muscle abscess. Repeat head imaging not suggestive of hydrocephalus of increased ICP.  Overall picture consistent with meningitis and septic arthritis/paraspinal muscle abscess of the lumbar spine. Not floridly myelopathic on exam and no imaging evidence of cord compression.  No evidence of seizures on EEG  Recommendations  -Mainstay of treatment will be abx - ID on board. Duration TBD by ID in light of the spine findings. -NSGY consulted - no emergent intervention likely. Appreciate consultation. -Stat CTH if mentation  worsens or is more somnolent or has CN abnormalities pop up on exam -No need for AEDs  D/W Drs. Weiting and Aleskerov in the ICU.  Neurology will follow. _______________________________________________________________  -- Amie Portland, MD Neurologist [facility] Pager: [phone]  CRITICAL CARE ATTESTATION Performed by: Amie Portland, MD Total critical care time: 30 minutes Critical care time was exclusive of separately billable procedures and treating other patients and/or supervising APPs/Residents/Students Critical care was necessary to treat or prevent imminent or life-threatening deterioration due to meningitis, septic arthritis lumbar spine, paraspinal abscess lumbar spine. This patient is  critically ill and at significant risk for neurological worsening and/or death and care requires constant monitoring. Critical care was time spent personally by me on the following activities: development of treatment plan with patient and/or surrogate as well as nursing, discussions with consultants, evaluation of patient's response to treatment, examination of patient, obtaining history from patient or surrogate, ordering and performing treatments and interventions, ordering and review of laboratory studies, ordering and review of radiographic studies, pulse oximetry, re-evaluation of patient's condition, participation in multidisciplinary rounds and medical decision making of high complexity in the care of this patient.

## 2020-06-12 NOTE — Progress Notes (Signed)
Date of Admission:  06/09/2020     ID: Tonya Myers is a 55 y.o. female  Principal Problem:   Acute encephalopathy Active Problems:   CLL (chronic lymphocytic leukemia) (HCC)   Hypothyroidism   Essential hypertension   Uncontrolled type 2 diabetes mellitus with hyperglycemia, without long-term current use of insulin (HCC)   Aphasia   Hyperglycemic crisis in diabetes mellitus ([place])   Acute metabolic encephalopathy   Severe sepsis (HCC)   Pneumococcal meningitis   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HCC)   Epidural abscess    Subjective: Patient remains sedated As per the nurse she was thrashing around and was agitated and hence had to be sedated Medications:  . chlorhexidine  15 mL Mouth Rinse BID  . Chlorhexidine Gluconate Cloth  6 each Topical Q0600  . insulin aspart  0-15 Units Subcutaneous Q4H  . insulin glargine  25 Units Subcutaneous Daily  . levothyroxine  150 mcg Oral Q0600  . mouth rinse  15 mL Mouth Rinse q12n4p  . pantoprazole (PROTONIX) IV  40 mg Intravenous Q24H  . sodium chloride flush  3 mL Intravenous Q12H    Objective: Vital signs in last 24 hours: Temp:  [96.7 F (35.9 C)-98.2 F (36.8 C)] 96.7 F (35.9 C) (02/17 1600) Resp:  [15-34] 27 (02/17 1500) BP: (107-150)/(56-102) 130/56 (02/17 1500) SpO2:  [97 %-100 %] 100 % (02/17 1600)  PHYSICAL EXAM:  General: Sedated, tachypneic breathing through her mouth Lungs: Bilateral air entry Heart: Tachycardia abdomen: Soft, non-tender,not distended. Bowel sounds normal. No masses Extremities: atraumatic, no cyanosis. No edema. No clubbing Foley Skin: No rashes or lesions. Or bruising Lymph: Cervical, supraclavicular normal. Neurologic: Cannot be assessed l  Lab Results Recent Labs    06/11/20 0316 06/12/20 1027  WBC 12.5* 17.0*  HGB 10.8* 8.6*  HCT 31.9* 26.3*  NA 145 149*  K 3.7 4.5  CL 113* 117*  CO2 21* 20*  BUN 20 39*  CREATININE 1.01* 1.13*   Liver Panel Recent Labs    06/10/20 0600  06/12/20 1027  PROT 7.2 5.9*  ALBUMIN 2.4* 1.7*  AST 39 29  ALT 27 14  ALKPHOS 204* 133*  BILITOT 2.4* 1.4*   Sedimentation Rate No results for input(s): ESRSEDRATE in the last 72 hours. C-Reactive Protein No results for input(s): CRP in the last 72 hours.  Microbiology:  Studies/Results: EEG  Result Date: 06/12/2020 Lora Havens, MD     06/12/2020  8:56 AM Patient Name: Tonya Myers MRN: [id number] Epilepsy Attending: Lora Havens Referring Physician/Provider: Dr. Neva Seat Date: 06/11/2020 Duration: 29.31 mins Patient history: 55 year old female with encephalopathy and pneumococcal meningitis.  EEG to eval for seizures. Level of alertness: Awake, asleep AEDs during EEG study: None Technical aspects: This EEG study was done with scalp electrodes positioned according to the 10-20 International system of electrode placement. Electrical activity was acquired at a sampling rate of 500Hz  and reviewed with a high frequency filter of 70Hz  and a low frequency filter of 1Hz . EEG data were recorded continuously and digitally stored. Description: No clear posterior dominant rhythm was seen. Sleep was characterized by vertex waves, sleep spindles (12 to 14 Hz), maximal frontocentral region.  EEG showed continuous generalized 3 to 6 Hz theta-delta slowing. Hyperventilation and photic stimulation were not performed.   ABNORMALITY -Continuous slow, generalized IMPRESSION: This study is suggestive of moderate diffuse encephalopathy, nonspecific etiology. No seizures or epileptiform discharges were seen throughout the recording. Priyanka [name]   CT HEAD WO CONTRAST  Result Date:  06/11/2020 CLINICAL DATA:  Sepsis.  Meningitis hydrocephalus. EXAM: CT HEAD WITHOUT CONTRAST TECHNIQUE: Contiguous axial images were obtained from the base of the skull through the vertex without intravenous contrast. COMPARISON:  Head CT 06/09/2020.  MRI 06/10/2020. FINDINGS: Brain: The brain shows a normal  appearance without evidence of malformation, atrophy, old or acute small or large vessel infarction, mass lesion, hemorrhage, hydrocephalus or extra-axial collection. Vascular: No hyperdense vessel. No evidence of atherosclerotic calcification. Skull: Normal.  No traumatic finding.  No focal bone lesion. Sinuses/Orbits: Sinuses are clear. Orbits appear normal. Mastoids are clear. Other: None significant IMPRESSION: Normal head CT. Electronically Signed   By: Nelson Chimes M.D.   On: 06/11/2020 15:47   MR CERVICAL SPINE WO CONTRAST  Result Date: 06/11/2020 CLINICAL DATA:  Pneumococcal meningitis. Neck and back pain. Evaluate for epidural abscess. EXAM: MRI CERVICAL, THORACIC AND LUMBAR SPINE WITHOUT CONTRAST TECHNIQUE: Multiplanar and multiecho pulse sequences of the cervical spine, to include the craniocervical junction and cervicothoracic junction, and thoracic and lumbar spine, were obtained without intravenous contrast. COMPARISON:  None FINDINGS: MRI CERVICAL SPINE FINDINGS Limited examination due to significant patient motion. Alignment: Normal alignment. Vertebrae: Grossly normal marrow signal. No bone lesions or fractures. No findings suspicious for discitis or osteomyelitis. Cord: Grossly normal cord signal intensity. No cord lesion or syrinx. Posterior Fossa, vertebral arteries, paraspinal tissues: No significant findings. Disc levels: C2-3: Shallow broad-based disc protrusion with mass effect on the ventral thecal sac. No significant foraminal stenosis. C3-4: Large central and left paracentral disc protrusion with mass effect on the thecal sac and flattening of the cervical cord. Mild foraminal stenosis bilaterally also. C4-5: Bulging degenerated annulus, osteophytic ridging and left paracentral disc protrusion with mass effect on the thecal sac. There is also mild bilateral foraminal stenosis. C5-6: Degenerated bulging annulus, central disc protrusion and facet disease with mild bilateral foraminal  stenosis. C6-7: Bulging degenerated annulus and osteophytic ridging with flattening of the ventral thecal sac asymmetric left. Mild left foraminal stenosis. C7-T1: Bulging degenerated annulus and left paracentral disc protrusion with mild mass effect on the left side of thecal sac. No significant foraminal stenosis. MRI THORACIC SPINE FINDINGS Alignment:  Normal alignment of the thoracic vertebral bodies. Vertebrae: Normal marrow signal. Small scattered hemangiomas but no worrisome bone lesions or fractures. No findings suspicious for discitis or osteomyelitis. The facets are normally aligned. No facet or laminar fractures. No evidence of facet septic arthritis. Cord: Grossly normal thoracic spinal cord. No obvious cord lesions or syrinx. Paraspinal and other soft tissues: No significant paraspinal findings. Disc levels: Small central disc protrusion at T6-7. MRI LUMBAR SPINE FINDINGS Segmentation: There are five lumbar type vertebral bodies. The last full intervertebral disc space is labeled L5-S1. Alignment:  Normal Vertebrae: No bone lesions or fractures. No findings suspicious for septic arthritis or osteomyelitis. Endplate reactive changes at L5-S1. Significant facet arthropathy noted on the left side at L2-3 and L3-4 with findings consistent with septic arthritis. Associated abscesses in the paraspinal muscles associated with both these levels. There is also significant surrounding myositis. Findings suspicious for a small epidural abscess on the left side at C2-3 associated with the infected left facet joint. No significant mass effect on the thecal sac. Conus medullaris and cauda equina: Conus extends to the L1-2 level. Conus and cauda equina appear normal. Paraspinal and other soft tissues: Abscesses in the left paraspinal muscles at L2, L3 and L4. Disc levels: L1-2: No significant findings. L2-3: No findings for discitis or osteomyelitis. Small posterior epidural abscess  on the left side associated with  the septic arthritis in the left L2-3 facet joint. No disc protrusions, spinal or foraminal stenosis. L3-4: No disc protrusions, spinal or foraminal stenosis. Septic arthritis involving the left L3-4 facet joint with an adjacent soft tissue abscess measuring approximately 18 x 15 mm. Smaller adjacent abscesses are also noted in the left paraspinal muscle. L4-5: No evidence of discitis or osteomyelitis. There is a shallow central disc protrusion with mass effect on the ventral thecal sac and mild bilateral lateral recess stenosis. No foraminal stenosis. The facet joints are intact at this level. There are some small abscesses in the left paraspinal muscle extending down just below the L4-5 disc space. L5-S1: Degenerative disc disease with endplate reactive changes. There is a shallow broad-based disc protrusion asymmetric left with mass effect on the thecal sac and on the left S1 nerve root. No foraminal stenosis. No septic arthritis involving the facet joints. IMPRESSION: 1. MR findings consistent with septic arthritis involving the left L2-3 and L3-4 facet joints with associated abscess is in the left paraspinal muscles. 2. Suspect small epidural abscess posteriorly at L2-3 on the left side. 3. No definite MR findings for discitis or osteomyelitis in the cervical, thoracic or lumbar spines. 4. Degenerative cervical spondylosis with multilevel disc disease and facet disease. Multilevel disc protrusions most significant at C3-4. Electronically Signed   By: Marijo Sanes M.D.   On: 06/11/2020 15:06   MR THORACIC SPINE WO CONTRAST  Result Date: 06/11/2020 CLINICAL DATA:  Pneumococcal meningitis. Neck and back pain. Evaluate for epidural abscess. EXAM: MRI CERVICAL, THORACIC AND LUMBAR SPINE WITHOUT CONTRAST TECHNIQUE: Multiplanar and multiecho pulse sequences of the cervical spine, to include the craniocervical junction and cervicothoracic junction, and thoracic and lumbar spine, were obtained without intravenous  contrast. COMPARISON:  None FINDINGS: MRI CERVICAL SPINE FINDINGS Limited examination due to significant patient motion. Alignment: Normal alignment. Vertebrae: Grossly normal marrow signal. No bone lesions or fractures. No findings suspicious for discitis or osteomyelitis. Cord: Grossly normal cord signal intensity. No cord lesion or syrinx. Posterior Fossa, vertebral arteries, paraspinal tissues: No significant findings. Disc levels: C2-3: Shallow broad-based disc protrusion with mass effect on the ventral thecal sac. No significant foraminal stenosis. C3-4: Large central and left paracentral disc protrusion with mass effect on the thecal sac and flattening of the cervical cord. Mild foraminal stenosis bilaterally also. C4-5: Bulging degenerated annulus, osteophytic ridging and left paracentral disc protrusion with mass effect on the thecal sac. There is also mild bilateral foraminal stenosis. C5-6: Degenerated bulging annulus, central disc protrusion and facet disease with mild bilateral foraminal stenosis. C6-7: Bulging degenerated annulus and osteophytic ridging with flattening of the ventral thecal sac asymmetric left. Mild left foraminal stenosis. C7-T1: Bulging degenerated annulus and left paracentral disc protrusion with mild mass effect on the left side of thecal sac. No significant foraminal stenosis. MRI THORACIC SPINE FINDINGS Alignment:  Normal alignment of the thoracic vertebral bodies. Vertebrae: Normal marrow signal. Small scattered hemangiomas but no worrisome bone lesions or fractures. No findings suspicious for discitis or osteomyelitis. The facets are normally aligned. No facet or laminar fractures. No evidence of facet septic arthritis. Cord: Grossly normal thoracic spinal cord. No obvious cord lesions or syrinx. Paraspinal and other soft tissues: No significant paraspinal findings. Disc levels: Small central disc protrusion at T6-7. MRI LUMBAR SPINE FINDINGS Segmentation: There are five lumbar  type vertebral bodies. The last full intervertebral disc space is labeled L5-S1. Alignment:  Normal Vertebrae: No bone lesions or  fractures. No findings suspicious for septic arthritis or osteomyelitis. Endplate reactive changes at L5-S1. Significant facet arthropathy noted on the left side at L2-3 and L3-4 with findings consistent with septic arthritis. Associated abscesses in the paraspinal muscles associated with both these levels. There is also significant surrounding myositis. Findings suspicious for a small epidural abscess on the left side at C2-3 associated with the infected left facet joint. No significant mass effect on the thecal sac. Conus medullaris and cauda equina: Conus extends to the L1-2 level. Conus and cauda equina appear normal. Paraspinal and other soft tissues: Abscesses in the left paraspinal muscles at L2, L3 and L4. Disc levels: L1-2: No significant findings. L2-3: No findings for discitis or osteomyelitis. Small posterior epidural abscess on the left side associated with the septic arthritis in the left L2-3 facet joint. No disc protrusions, spinal or foraminal stenosis. L3-4: No disc protrusions, spinal or foraminal stenosis. Septic arthritis involving the left L3-4 facet joint with an adjacent soft tissue abscess measuring approximately 18 x 15 mm. Smaller adjacent abscesses are also noted in the left paraspinal muscle. L4-5: No evidence of discitis or osteomyelitis. There is a shallow central disc protrusion with mass effect on the ventral thecal sac and mild bilateral lateral recess stenosis. No foraminal stenosis. The facet joints are intact at this level. There are some small abscesses in the left paraspinal muscle extending down just below the L4-5 disc space. L5-S1: Degenerative disc disease with endplate reactive changes. There is a shallow broad-based disc protrusion asymmetric left with mass effect on the thecal sac and on the left S1 nerve root. No foraminal stenosis. No septic  arthritis involving the facet joints. IMPRESSION: 1. MR findings consistent with septic arthritis involving the left L2-3 and L3-4 facet joints with associated abscess is in the left paraspinal muscles. 2. Suspect small epidural abscess posteriorly at L2-3 on the left side. 3. No definite MR findings for discitis or osteomyelitis in the cervical, thoracic or lumbar spines. 4. Degenerative cervical spondylosis with multilevel disc disease and facet disease. Multilevel disc protrusions most significant at C3-4. Electronically Signed   By: Marijo Sanes M.D.   On: 06/11/2020 15:06   MR LUMBAR SPINE WO CONTRAST  Result Date: 06/11/2020 CLINICAL DATA:  Pneumococcal meningitis. Neck and back pain. Evaluate for epidural abscess. EXAM: MRI CERVICAL, THORACIC AND LUMBAR SPINE WITHOUT CONTRAST TECHNIQUE: Multiplanar and multiecho pulse sequences of the cervical spine, to include the craniocervical junction and cervicothoracic junction, and thoracic and lumbar spine, were obtained without intravenous contrast. COMPARISON:  None FINDINGS: MRI CERVICAL SPINE FINDINGS Limited examination due to significant patient motion. Alignment: Normal alignment. Vertebrae: Grossly normal marrow signal. No bone lesions or fractures. No findings suspicious for discitis or osteomyelitis. Cord: Grossly normal cord signal intensity. No cord lesion or syrinx. Posterior Fossa, vertebral arteries, paraspinal tissues: No significant findings. Disc levels: C2-3: Shallow broad-based disc protrusion with mass effect on the ventral thecal sac. No significant foraminal stenosis. C3-4: Large central and left paracentral disc protrusion with mass effect on the thecal sac and flattening of the cervical cord. Mild foraminal stenosis bilaterally also. C4-5: Bulging degenerated annulus, osteophytic ridging and left paracentral disc protrusion with mass effect on the thecal sac. There is also mild bilateral foraminal stenosis. C5-6: Degenerated bulging  annulus, central disc protrusion and facet disease with mild bilateral foraminal stenosis. C6-7: Bulging degenerated annulus and osteophytic ridging with flattening of the ventral thecal sac asymmetric left. Mild left foraminal stenosis. C7-T1: Bulging degenerated annulus and left  paracentral disc protrusion with mild mass effect on the left side of thecal sac. No significant foraminal stenosis. MRI THORACIC SPINE FINDINGS Alignment:  Normal alignment of the thoracic vertebral bodies. Vertebrae: Normal marrow signal. Small scattered hemangiomas but no worrisome bone lesions or fractures. No findings suspicious for discitis or osteomyelitis. The facets are normally aligned. No facet or laminar fractures. No evidence of facet septic arthritis. Cord: Grossly normal thoracic spinal cord. No obvious cord lesions or syrinx. Paraspinal and other soft tissues: No significant paraspinal findings. Disc levels: Small central disc protrusion at T6-7. MRI LUMBAR SPINE FINDINGS Segmentation: There are five lumbar type vertebral bodies. The last full intervertebral disc space is labeled L5-S1. Alignment:  Normal Vertebrae: No bone lesions or fractures. No findings suspicious for septic arthritis or osteomyelitis. Endplate reactive changes at L5-S1. Significant facet arthropathy noted on the left side at L2-3 and L3-4 with findings consistent with septic arthritis. Associated abscesses in the paraspinal muscles associated with both these levels. There is also significant surrounding myositis. Findings suspicious for a small epidural abscess on the left side at C2-3 associated with the infected left facet joint. No significant mass effect on the thecal sac. Conus medullaris and cauda equina: Conus extends to the L1-2 level. Conus and cauda equina appear normal. Paraspinal and other soft tissues: Abscesses in the left paraspinal muscles at L2, L3 and L4. Disc levels: L1-2: No significant findings. L2-3: No findings for discitis or  osteomyelitis. Small posterior epidural abscess on the left side associated with the septic arthritis in the left L2-3 facet joint. No disc protrusions, spinal or foraminal stenosis. L3-4: No disc protrusions, spinal or foraminal stenosis. Septic arthritis involving the left L3-4 facet joint with an adjacent soft tissue abscess measuring approximately 18 x 15 mm. Smaller adjacent abscesses are also noted in the left paraspinal muscle. L4-5: No evidence of discitis or osteomyelitis. There is a shallow central disc protrusion with mass effect on the ventral thecal sac and mild bilateral lateral recess stenosis. No foraminal stenosis. The facet joints are intact at this level. There are some small abscesses in the left paraspinal muscle extending down just below the L4-5 disc space. L5-S1: Degenerative disc disease with endplate reactive changes. There is a shallow broad-based disc protrusion asymmetric left with mass effect on the thecal sac and on the left S1 nerve root. No foraminal stenosis. No septic arthritis involving the facet joints. IMPRESSION: 1. MR findings consistent with septic arthritis involving the left L2-3 and L3-4 facet joints with associated abscess is in the left paraspinal muscles. 2. Suspect small epidural abscess posteriorly at L2-3 on the left side. 3. No definite MR findings for discitis or osteomyelitis in the cervical, thoracic or lumbar spines. 4. Degenerative cervical spondylosis with multilevel disc disease and facet disease. Multilevel disc protrusions most significant at C3-4. Electronically Signed   By: Marijo Sanes M.D.   On: 06/11/2020 15:06   ECHOCARDIOGRAM COMPLETE  Result Date: 06/12/2020    ECHOCARDIOGRAM REPORT   Patient Name:   ATAYA MURDY Date of Exam: 06/11/2020 Medical Rec #:  [id number]       Height:       72.0 in Accession #:    [id number]      Weight:       245.0 lb Date of Birth:  July 31, 1965       BSA:          2.322 m Patient Age:    28 years  BP:            126/73 mmHg Patient Gender: F               HR:           75 bpm. Exam Location:  [facility] Procedure: 2D Echo, Cardiac Doppler and Color Doppler Indications:     R78.81 Bacteremia  History:         Patient has no prior history of Echocardiogram examinations.                  Risk Factors:Hypertension and Diabetes. Hypothyroidism.  Sonographer:     Wilford Sports Rodgers-Jones Referring Phys:  [id number] [name] Diagnosing Phys: Ida Rogue MD IMPRESSIONS  1. Left ventricular ejection fraction, by estimation, is 50 to 55%. The left ventricle has low normal function. The left ventricle has no regional wall motion abnormalities. There is mild left ventricular hypertrophy. Left ventricular diastolic parameters were normal.  2. Right ventricular systolic function is normal. The right ventricular size is normal. There is normal pulmonary artery systolic pressure. The estimated right ventricular systolic pressure is 63.8 mmHg.  3. The mitral valve is normal in structure. Mild mitral valve regurgitation. No evidence of mitral stenosis.  4. The aortic valve is grossly normal in structure, though difficult to fully visualize. Aortic valve regurgitation is not visualized. No aortic stenosis is present.  5. Grossly, no valve vegetation noted. FINDINGS  Left Ventricle: Left ventricular ejection fraction, by estimation, is 50 to 55%. The left ventricle has low normal function. The left ventricle has no regional wall motion abnormalities. The left ventricular internal cavity size was normal in size. There is mild left ventricular hypertrophy. Left ventricular diastolic parameters were normal. Right Ventricle: The right ventricular size is normal. No increase in right ventricular wall thickness. Right ventricular systolic function is normal. There is normal pulmonary artery systolic pressure. The tricuspid regurgitant velocity is 2.13 m/s, and  with an assumed right atrial pressure of 5 mmHg, the estimated right ventricular  systolic pressure is 46.6 mmHg. Left Atrium: Left atrial size was normal in size. Right Atrium: Right atrial size was normal in size. Pericardium: There is no evidence of pericardial effusion. Mitral Valve: The mitral valve is normal in structure. Mild mitral valve regurgitation. No evidence of mitral valve stenosis. Tricuspid Valve: The tricuspid valve is normal in structure. Tricuspid valve regurgitation is mild . No evidence of tricuspid stenosis. Aortic Valve: The aortic valve is normal in structure. Aortic valve regurgitation is not visualized. No aortic stenosis is present. Pulmonic Valve: The pulmonic valve was normal in structure. Pulmonic valve regurgitation is not visualized. No evidence of pulmonic stenosis. Aorta: The aortic root is normal in size and structure. Venous: The inferior vena cava is normal in size with greater than 50% respiratory variability, suggesting right atrial pressure of 3 mmHg. IAS/Shunts: No atrial level shunt detected by color flow Doppler.  LEFT VENTRICLE PLAX 2D LVIDd:         4.39 cm  Diastology LVIDs:         3.09 cm  LV e' medial:    7.40 cm/s LV PW:         1.03 cm  LV E/e' medial:  11.7 LV IVS:        1.10 cm  LV e' lateral:   9.36 cm/s LVOT diam:     1.80 cm  LV E/e' lateral: 9.3 LV SV:         56 LV SV  Index:   24 LVOT Area:     2.54 cm  RIGHT VENTRICLE             IVC RV Basal diam:  3.51 cm     IVC diam: 2.46 cm RV S prime:     10.10 cm/s LEFT ATRIUM             Index       RIGHT ATRIUM           Index LA diam:        4.10 cm 1.77 cm/m  RA Area:     12.50 cm LA Vol (A2C):   63.4 ml 27.30 ml/m RA Volume:   30.60 ml  13.18 ml/m LA Vol (A4C):   39.6 ml 17.05 ml/m LA Biplane Vol: 53.7 ml 23.12 ml/m  AORTIC VALVE LVOT Vmax:   108.00 cm/s LVOT Vmean:  71.100 cm/s LVOT VTI:    0.219 m  AORTA Ao Root diam: 3.10 cm MITRAL VALVE               TRICUSPID VALVE MV Area (PHT): 4.21 cm    TR Peak grad:   18.1 mmHg MV Decel Time: 180 msec    TR Vmax:        213.00 cm/s MV E  velocity: 86.80 cm/s MV A velocity: 57.70 cm/s  SHUNTS MV E/A ratio:  1.50        Systemic VTI:  0.22 m                            Systemic Diam: 1.80 cm Ida Rogue MD Electronically signed by Ida Rogue MD Signature Date/Time: 06/12/2020/7:33:57 AM    Final    [place] EKG SITE RITE  Result Date: 06/10/2020 If Site Rite image not attached, placement could not be confirmed due to current cardiac rhythm.    Assessment/Plan:  Strep pneumo bacteremia  Strep pneumo meningitis.  Continue ceftriaxone 2 g IV every 12.  As is highly susceptible can stop vancomycin.  Epidural abscess in the left side of L2-L3 along with infected left facet joint  L2-L3 and L3-L4 septic arthritis of the facet joint Associated paraspinal abscess  Worsening leukocytosis: Keep a close eye   neurosurgery evaluated her and did not think patient needed any surgery I discussed with interventional radiologist and he said the paraspinal abscesses were too small and would not yield much on aspiration.  If the patient does not recover as expected then we may have to repeat the imaging and consider drainage if needed.

## 2020-06-12 NOTE — Consult Note (Signed)
NAME:  Tonya Myers, MRN:  [id number], DOB:  [birth year], LOS: 3 ADMISSION DATE:  06/09/2020, CONSULTATION DATE:  06/12/2020 REFERRING MD:  Dr. Leslye Peer, CHIEF COMPLAINT:  Altered Mental Status  Brief History:  55 y.o. 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  Requiring Precedex.  History of Present Illness:  Tonya Myers is a 55 y.o. Female with a past medical history as listed below who presented to [facility] ED on 06/09/2020 due to Altered Mental Status and expressive Aphasia.  She also reported weakness and back pain.     Workup revealed DKA, AKI, and elevated LFT's. CT Head and MR Head were both negative.  She exhibited nuchal rigidity concerning for potential CNS infection.  She was placed on broad spectrum coverage with Vancomycin, Cefepime, Ampicillin, and Acyclovir.  Neurology and Infectious Diseases were consulted. Lumbar Puncture was performed on 06/10/20.  Both blood cultures and CSF culture are positive for Pneumococcus.  EEG is negative for seizures.  MR of the Cervical/thoracic/lumbar spine is concerning for septic arthritis/paraspinal  Muscle abscess of the lumbar spine.  Neurosurgery surgery has been consulted.    While in the ICU she has required Precedex infusion due to severe agitation and altered mental status. 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   Past Medical History:  Hypothyroidism Hypertension Diabetes mellitus Depression Chronic for pleural ascitic leukemia Chronic back pain Asthma Anxiety General herpes  Significant Hospital Events:  2/14: To be admitted to [facility], however remain in ED due to bed availability issues 2/15: Lumbar puncture performed 2/16: Blood cultures and CSF cultures + for  Pneumococcus 2/17: PCCM consulted due to Precedex  Consults:  Hospitalist (primary service) Neurology Infectious Diseases Neurosurgery PCCM  Procedures:  2/15: Lumbar Puncture  Significant Diagnostic Tests:  2/14: CT Head w/o contrast>>No acute intracranial abnormality. 2/15: X-ray Eye>>No evidence of metallic foreign body within the orbits. 2/15: X-ray Pelvis>>There is no evidence of pelvic fracture or diastasis. No pelvic bone lesions are seen. 2/15: MR Brain w/o contrast>>Normal brain MRI. 2/16: MR Cervical/Thoracic/Lumbar Spine>>1. MR findings consistent with septic arthritis involving the left L2-3 and L3-4 facet joints with associated abscess is in the left paraspinal muscles. 2. Suspect small epidural abscess posteriorly at L2-3 on the left side. 3. No definite MR findings for discitis or osteomyelitis in the cervical, thoracic or lumbar spines. 4. Degenerative cervical spondylosis with multilevel disc disease and facet disease. Multilevel disc protrusions most significant at C3-4. 2/16: CT Head w/o contrast>>Normal head CT. 2/16: Echocardiogram>>1. Left ventricular ejection fraction, by estimation, is 50 to 55%. The  left ventricle has low normal function. The left ventricle has no regional  wall motion abnormalities. There is mild left ventricular hypertrophy.  Left ventricular diastolic  parameters were normal.  2. Right ventricular systolic function is normal. The right ventricular  size is normal. There is normal pulmonary artery systolic pressure. The  estimated right ventricular systolic pressure is 01.7 mmHg.  3. The mitral valve is normal in structure. Mild mitral valve  regurgitation. No evidence of mitral stenosis.  4. The aortic valve is grossly normal in structure, though difficult to  fully visualize. Aortic valve regurgitation is not visualized. No aortic  stenosis is present.  5. Grossly, no valve vegetation noted.  2/16: EEG>>This study is  suggestive of moderate diffuse encephalopathy, nonspecific etiology. No seizures  or epileptiform discharges were seen throughout the recording.  Micro Data:  2/14: SARS-CoV-2 PCR>>negative 2/14: Influenza A&B PCR>>negative 2/15: RPR>>non reactive 2/15: Blood culture>>Streptococcus pneumoniae 2/15: CSF culture>>STREPTOCOCCUS PNEUMONIAE  2/16: MRSA PCR>>negative 2/16: HIV screen>>non reactive 2/16: Strep Pneumo urinary antigen>> POSITIVE 2/17: Blood culture x2>>  Antimicrobials:  Cefepime 2/15 x1 dose Ceftriaxone 2/15>> Vancomycin 2/15>>  Interim History / Subjective:  -Pt currently calm on Precedex (has purposeful movements of all extremities but doesn't follow commands) -Hemodynamically stable, Afebrile, protecting her airway   Objective   Blood pressure 140/67, pulse 65, temperature 98.2 F (36.8 C), temperature source Axillary, resp. rate (!) 24, height 6' (1.829 m), weight 111.1 kg, SpO2 100 %.        Intake/Output Summary (Last 24 hours) at 06/12/2020 6222 Last data filed at 06/12/2020 0600 Gross per 24 hour  Intake 2086.83 ml  Output 895 ml  Net 1191.83 ml   Filed Weights   06/09/20 1905  Weight: 111.1 kg    Examination: General: Acutely ill-appearing female, laying in bed, sedated on Precedex, no acute distress HENT: Atraumatic, normocephalic, neck supple, no JVD Lungs: Clear to auscultation bilaterally, even, nonlabored, normal effort Cardiovascular: Regular rate and rhythm, S1-S2, no murmurs, rubs, gallops Abdomen: Obese, soft, nontender, nondistended, no guarding or rebound tenderness, bowel sounds positive x4 Extremities: Normal bulk and tone, no deformities, no clubbing, no edema Neuro: Lethargic, intermittently opens eyes and groans, purposeful movement all extremities, does not follow commands, pupils PERRLA GU: Deferred Skin: Warm and dry.  No obvious rashes, lesions, ulcerations  Resolved Hospital Problem list   DKA  Assessment & Plan:   Sepsis &  Pneumococcal BACTEREMIA in the setting of Pneumococcal Meningitis Septic Arthritis/Paraspinal Muscle Abscess of the Lumbar Spine (L2-L3 & L3-L4) -Monitor fever curve -Trend WBC's -Follow cultures as above -ID following, appreciate input -Antibiotics as per ID ~ currently on Ceftriaxone and Vancomycin -Neurology following, appreciate input -Neurosurgery consulted, appreciate input   Acute Metabolic Encephalopathy in the setting of Pneumococcal Meningitis -Provide supportive care -Precedex as needed for severe agitation -Currently protecting her airway -Avoid sedating meds as able -Continue Antibiotics as per ID -CT Head and MR Head negative -EEG negative for seizures -Urine drug screen positive for Tricyclics   AKI>>improved -Monitor I&O's / urinary output -Follow BMP -Ensure adequate renal perfusion -Avoid nephrotoxic agents as able -Replace electrolytes as indicated -IV Fluids   DKA>>resolved Diabetes Mellitus -CBG's -SSI and Lantus -Follow ICU Hypo/hyperglycemia protocol   Hypothyroidism -Continue home synthroid  Best practice (evaluated daily)  Diet: NPO Pain/Anxiety/Delirium protocol (if indicated): Precedex, prn Fentanyl VAP protocol (if indicated): N/A DVT prophylaxis: SCD's GI prophylaxis: N/A Glucose control: SSI, Lantus Mobility: Bedrest Disposition:ICU  Goals of Care:  Last date of multidisciplinary goals of care discussion: 06/12/20 Family and staff present: Rn at bedside; no family available for update during NP rounds Summary of discussion: Continue ABX as per ID, Neurosurgery consulted, wean precedex as able Follow up goals of care discussion due: 06/13/20 Code Status: Full Code  Labs   CBC: Recent Labs  Lab 06/09/20 1157 06/09/20 2037 06/10/20 0600 06/11/20 0316  WBC 15.5* 16.0* 12.2* 12.5*  HGB 10.1* 10.1* 12.3 10.8*  HCT 31.4* 31.0* 37.1 31.9*  MCV 80.1 79.3* 78.1* 77.1*  PLT 330 369 286 979    Basic Metabolic Panel: Recent Labs   Lab 06/09/20 1157 06/09/20 2037 06/10/20 0600 06/11/20 0316  NA 137 133* 139 145  K 4.1 4.7 3.7 3.7  CL 104 100 106 113*  CO2 17*  18* 21* 21*  GLUCOSE 274* 455* 191* 308*  BUN 20 30* 24* 20  CREATININE 1.35* 1.56* 1.17* 1.01*  CALCIUM 9.3 9.2 9.1 8.9  MG 1.8  --   --   --   PHOS 3.8  --   --   --    GFR: Estimated Creatinine Clearance: 86.7 mL/min (A) (by C-G formula based on SCr of 1.01 mg/dL (H)). Recent Labs  Lab 06/09/20 1157 06/09/20 2037 06/10/20 0600 06/11/20 0316 06/11/20 1007  WBC 15.5* 16.0* 12.2* 12.5*  --   LATICACIDVEN  --   --   --  2.4* 1.5    Liver Function Tests: Recent Labs  Lab 06/09/20 1157 06/09/20 2037 06/10/20 0600  AST 50* 44* 39  ALT 36 31 27  ALKPHOS 200* 211* 204*  BILITOT 2.1* 2.2* 2.4*  PROT 7.3 7.6 7.2  ALBUMIN 2.8* 2.8* 2.4*   Recent Labs  Lab 06/09/20 1157  LIPASE 29   Recent Labs  Lab 06/09/20 2145  AMMONIA 14    ABG    Component Value Date/Time   HCO3 22.2 06/11/2020 0316   ACIDBASEDEF 2.0 06/11/2020 0316   O2SAT 64.3 06/11/2020 0316     Coagulation Profile: Recent Labs  Lab 06/10/20 1126  INR 1.3*    Cardiac Enzymes: No results for input(s): CKTOTAL, CKMB, CKMBINDEX, TROPONINI in the last 168 hours.  HbA1C: Hgb A1c MFr Bld  Date/Time Value Ref Range Status  06/09/2020 08:37 PM 8.0 (H) 4.8 - 5.6 % Final    Comment:    (NOTE)         Prediabetes: 5.7 - 6.4         Diabetes: >6.4         Glycemic control for adults with diabetes: <7.0     CBG: Recent Labs  Lab 06/11/20 1215 06/11/20 1541 06/11/20 2017 06/11/20 2336 06/12/20 0401  GLUCAP 263* 209* 186* 178* 164*    Review of Systems:   Unable to assess due to AMS  Past Medical History:  She,  has a past medical history of Acute hemorrhoid (03/11/2015), Anxiety, Arthritis, Asthma, Cancer ([place]), Chronic back pain, CLL (chronic lymphocytic leukemia) ([place]), Depression, Diabetes mellitus without complication ([place]), Genital herpes,  Hypertension, Hypothyroidism, and Vertigo.   Surgical History:   Past Surgical History:  Procedure Laterality Date  . ABDOMINAL HYSTERECTOMY    . BILATERAL SALPINGOOPHORECTOMY  2009  . BREAST BIOPSY Right 05/17/2016   FIBROADENOMATOUS CHANGE AND SCLEROSING ADENOSIS WITH  . COLONOSCOPY WITH PROPOFOL N/A 06/16/2015   Procedure: COLONOSCOPY WITH PROPOFOL;  Surgeon: [name], MD;  Location: [facility] ENDOSCOPY;  Service: Endoscopy;  Laterality: N/A;  . LAPAROSCOPIC SUPRACERVICAL HYSTERECTOMY  2009   due to [place]  . OOPHORECTOMY       Social History:   reports that she has never smoked. She has never used smokeless tobacco. She reports current alcohol use of about 1.0 standard drink of alcohol per week. She reports that she does not use drugs.   Family History:  Her family history includes Breast cancer (age of onset: 79) in her maternal aunt; Cancer in her paternal aunt.   Allergies No Known Allergies   Home Medications  Prior to Admission medications   Medication Sig Start Date End Date Taking? Authorizing Provider  cyclobenzaprine (FLEXERIL) 10 MG tablet Take 10 mg by mouth 3 (three) times daily. 06/04/20  Yes [provider]  diltiazem (DILACOR XR) 120 MG 24 hr capsule Take 120 mg by mouth daily.  12/26/14  Yes [provider]  estradiol (ESTRACE) 0.5 MG tablet Take 0.5 mg by mouth daily. 03/14/18  Yes [provider]  ezetimibe (ZETIA) 10 MG tablet Take 10 mg by mouth daily.    Yes [provider]  furosemide (LASIX) 20 MG tablet Take 20 mg by mouth daily.   Yes [provider]  Insulin Glargine-Lixisenatide 100-33 UNT-MCG/ML SOPN Inject 48 Units into the skin daily before breakfast. 01/07/20  Yes [provider]  insulin lispro (HUMALOG) 200 UNIT/ML KwikPen Inject 100 Units into the skin daily. Sliding scale average of 10 units   Yes [provider]  levocetirizine (XYZAL) 5 MG tablet Take 5 mg by mouth at bedtime.  04/02/20  Yes [provider]  levothyroxine (SYNTHROID, LEVOTHROID) 150 MCG tablet Take 150 mcg by mouth daily before breakfast.    Yes [provider]  metFORMIN (GLUCOPHAGE-XR) 500 MG 24 hr tablet Take 500 mg by mouth 2 (two) times daily. 04/02/20  Yes [provider]  montelukast (SINGULAIR) 10 MG tablet Take 10 mg by mouth at bedtime.   Yes [provider]  naproxen (NAPROSYN) 500 MG tablet Take 500 mg by mouth 2 (two) times daily. 06/03/20  Yes [provider]  pregabalin (LYRICA) 75 MG capsule Take 75 mg by mouth 2 (two) times daily. 06/05/20  Yes [provider]  rosuvastatin (CRESTOR) 10 MG tablet Take 10 mg by mouth at bedtime. 04/01/20  Yes [provider]  telmisartan-hydrochlorothiazide (MICARDIS HCT) 80-25 MG tablet Take 1 tablet by mouth daily. 06/09/20  Yes [provider]  valACYclovir (VALTREX) 1000 MG tablet Take 1 tablet (1,000 mg total) by mouth 2 (two) times daily. 07/20/18  Yes [name], MD  budesonide-formoterol (SYMBICORT) 160-4.5 MCG/ACT inhaler Inhale into the lungs. 07/14/18 03/15/19  [provider]     Critical care time: 50 minutes    Darel Hong, AGACNP-BC [facility] Pager: [phone]

## 2020-06-12 NOTE — Progress Notes (Addendum)
Medicated for pain at [phone] Medicated for agitation per Precedex drip& given Haldol at [phone]&            Patient moaned and thrashed and constantly moved all day. She mouth breathed until her mouth was extremely dry,refused mouth care and attempted out of bed at least every 1 hour. Bed rails and floors padded with mats to prevent patient injury.Patients arms and legs placed back in bed constantly.

## 2020-06-12 NOTE — Progress Notes (Signed)
Per secure chat with ID ,Dr. Delaine Lame, PICC can be placed 06/13/20 as long as blood cultures remain negative.

## 2020-06-12 NOTE — Progress Notes (Signed)
Breathing pattern of  Almost alneic respirations to tachypnea rate of 44 continues. Some words are very clear others are mumbling. Mouth breathing all day with extreme dryness and paleness of of mucous membranes.  Precedex increased to .8 mcg. ABG drawn per RT. No values are drastically abnormal. Will discuss with Dr. Lanney Gins.  Agitation increases and decreases and does not correlate with sedation.

## 2020-06-12 NOTE — Procedures (Signed)
Patient Name: Shira Bobst  MRN: [id number]  Epilepsy Attending: Lora Havens  Referring Physician/Provider: Dr. Neva Seat Date: 06/11/2020  Duration: 29.31 mins  Patient history: 55 year old female with encephalopathy and pneumococcal meningitis.  EEG to eval for seizures.  Level of alertness: Awake, asleep  AEDs during EEG study: None  Technical aspects: This EEG study was done with scalp electrodes positioned according to the 10-20 International system of electrode placement. Electrical activity was acquired at a sampling rate of 500Hz  and reviewed with a high frequency filter of 70Hz  and a low frequency filter of 1Hz . EEG data were recorded continuously and digitally stored.   Description: No clear posterior dominant rhythm was seen. Sleep was characterized by vertex waves, sleep spindles (12 to 14 Hz), maximal frontocentral region.  EEG showed continuous generalized 3 to 6 Hz theta-delta slowing. Hyperventilation and photic stimulation were not performed.     ABNORMALITY -Continuous slow, generalized  IMPRESSION: This study is suggestive of moderate diffuse encephalopathy, nonspecific etiology. No seizures or epileptiform discharges were seen throughout the recording.  Darivs Lunden [name]

## 2020-06-12 NOTE — Progress Notes (Signed)
Neuro: intermittently stable on precedex, required titrating for bradycardia, Pain control was an issue, required multiple Fentanyl doses as well as a toradol dose which seemed to help greatly; toward the end of the night she began having moments of clarity, asking what was going on, where she was, and what happened to her; beginning to follow simple commands Resp: stable on room air, becomes tachypneic with shallow breaths when in pain CV: afebrile, warm and well perfused, generalized edema GIGU: very difficult to perform oral care, patient bites yaunker; smear BM, foley in place Skin: cleadn dry and intact Social: no contact with family for update  Events: no significant events

## 2020-06-13 ENCOUNTER — Inpatient Hospital Stay: Payer: Self-pay

## 2020-06-13 DIAGNOSIS — B953 Streptococcus pneumoniae as the cause of diseases classified elsewhere: Secondary | ICD-10-CM | POA: Diagnosis not present

## 2020-06-13 DIAGNOSIS — G9341 Metabolic encephalopathy: Secondary | ICD-10-CM | POA: Diagnosis not present

## 2020-06-13 DIAGNOSIS — G9340 Encephalopathy, unspecified: Secondary | ICD-10-CM | POA: Diagnosis not present

## 2020-06-13 DIAGNOSIS — E870 Hyperosmolality and hypernatremia: Secondary | ICD-10-CM

## 2020-06-13 DIAGNOSIS — R7881 Bacteremia: Secondary | ICD-10-CM | POA: Diagnosis not present

## 2020-06-13 DIAGNOSIS — G039 Meningitis, unspecified: Secondary | ICD-10-CM

## 2020-06-13 DIAGNOSIS — G009 Bacterial meningitis, unspecified: Secondary | ICD-10-CM | POA: Diagnosis not present

## 2020-06-13 DIAGNOSIS — G001 Pneumococcal meningitis: Secondary | ICD-10-CM | POA: Diagnosis not present

## 2020-06-13 DIAGNOSIS — G062 Extradural and subdural abscess, unspecified: Secondary | ICD-10-CM | POA: Diagnosis not present

## 2020-06-13 DIAGNOSIS — M4620 Osteomyelitis of vertebra, site unspecified: Secondary | ICD-10-CM | POA: Diagnosis not present

## 2020-06-13 DIAGNOSIS — A419 Sepsis, unspecified organism: Secondary | ICD-10-CM | POA: Diagnosis not present

## 2020-06-13 LAB — CBC WITH DIFFERENTIAL/PLATELET
Abs Immature Granulocytes: 0.57 10*3/uL — ABNORMAL HIGH (ref 0.00–0.07)
Basophils Absolute: 0 10*3/uL (ref 0.0–0.1)
Basophils Relative: 0 %
Eosinophils Absolute: 0.1 10*3/uL (ref 0.0–0.5)
Eosinophils Relative: 1 %
HCT: 21.7 % — ABNORMAL LOW (ref 36.0–46.0)
Hemoglobin: 7.1 g/dL — ABNORMAL LOW (ref 12.0–15.0)
Immature Granulocytes: 5 %
Lymphocytes Relative: 16 %
Lymphs Abs: 1.8 10*3/uL (ref 0.7–4.0)
MCH: 26.3 pg (ref 26.0–34.0)
MCHC: 32.7 g/dL (ref 30.0–36.0)
MCV: 80.4 fL (ref 80.0–100.0)
Monocytes Absolute: 0.8 10*3/uL (ref 0.1–1.0)
Monocytes Relative: 7 %
Neutro Abs: 8.2 10*3/uL — ABNORMAL HIGH (ref 1.7–7.7)
Neutrophils Relative %: 71 %
Platelets: 181 10*3/uL (ref 150–400)
RBC: 2.7 MIL/uL — ABNORMAL LOW (ref 3.87–5.11)
RDW: 16.6 % — ABNORMAL HIGH (ref 11.5–15.5)
WBC: 11.4 10*3/uL — ABNORMAL HIGH (ref 4.0–10.5)
nRBC: 0 % (ref 0.0–0.2)

## 2020-06-13 LAB — BASIC METABOLIC PANEL
Anion gap: 14 (ref 5–15)
BUN: 44 mg/dL — ABNORMAL HIGH (ref 6–20)
CO2: 18 mmol/L — ABNORMAL LOW (ref 22–32)
Calcium: 8.8 mg/dL — ABNORMAL LOW (ref 8.9–10.3)
Chloride: 119 mmol/L — ABNORMAL HIGH (ref 98–111)
Creatinine, Ser: 1.21 mg/dL — ABNORMAL HIGH (ref 0.44–1.00)
GFR, Estimated: 53 mL/min — ABNORMAL LOW (ref >60)
Glucose, Bld: 174 mg/dL — ABNORMAL HIGH (ref 70–99)
Potassium: 4.6 mmol/L (ref 3.5–5.1)
Sodium: 151 mmol/L — ABNORMAL HIGH (ref 135–145)

## 2020-06-13 LAB — CULTURE, BLOOD (ROUTINE X 2): Special Requests: ADEQUATE

## 2020-06-13 LAB — GLUCOSE, CAPILLARY
Glucose-Capillary: 164 mg/dL — ABNORMAL HIGH (ref 70–99)
Glucose-Capillary: 162 mg/dL — ABNORMAL HIGH (ref 70–99)
Glucose-Capillary: 160 mg/dL — ABNORMAL HIGH (ref 70–99)
Glucose-Capillary: 179 mg/dL — ABNORMAL HIGH (ref 70–99)
Glucose-Capillary: 234 mg/dL — ABNORMAL HIGH (ref 70–99)
Glucose-Capillary: 256 mg/dL — ABNORMAL HIGH (ref 70–99)

## 2020-06-13 LAB — PHOSPHORUS: Phosphorus: 5.5 mg/dL — ABNORMAL HIGH (ref 2.5–4.6)

## 2020-06-13 LAB — MAGNESIUM: Magnesium: 2.4 mg/dL (ref 1.7–2.4)

## 2020-06-13 MED ORDER — THIAMINE HCL 100 MG/ML IJ SOLN: 100.0000 mg | Freq: Every day | INTRAMUSCULAR | Status: DC

## 2020-06-13 MED ORDER — FENTANYL CITRATE (PF) 100 MCG/2ML IJ SOLN
25.0000 ug | Freq: Once | INTRAMUSCULAR | Status: AC
  Administered 2020-06-13: 25 ug via INTRAVENOUS

## 2020-06-13 MED ORDER — FENTANYL CITRATE (PF) 100 MCG/2ML IJ SOLN
25.0000 ug | INTRAMUSCULAR | Status: DC | PRN
  Administered 2020-06-13 – 2020-06-14 (×7): 25 ug via INTRAVENOUS
  Filled 2020-06-13 (×8): qty 2

## 2020-06-13 MED ORDER — FENTANYL CITRATE (PF) 100 MCG/2ML IJ SOLN
25.0000 ug | Freq: Once | INTRAMUSCULAR | Status: AC
  Administered 2020-06-13: 25 ug via INTRAVENOUS
  Filled 2020-06-13: qty 2

## 2020-06-13 MED ORDER — SODIUM CHLORIDE 0.9% FLUSH
10.0000 mL | Freq: Two times a day (BID) | INTRAVENOUS | Status: DC
  Administered 2020-06-13 – 2020-06-18 (×9): 10 mL
  Administered 2020-06-18: 09:00:00 20 mL
  Administered 2020-06-19: 10 mL
  Administered 2020-06-19: 09:00:00 20 mL
  Administered 2020-06-20 – 2020-06-22 (×6): 10 mL
  Administered 2020-06-23: 20 mL
  Administered 2020-06-23: 10 mL
  Administered 2020-06-24: 20 mL
  Administered 2020-06-24: 23:00:00 10 mL
  Administered 2020-06-25: 30 mL
  Administered 2020-06-25: 10 mL
  Administered 2020-06-26: 30 mL
  Administered 2020-06-26: 21:00:00 10 mL
  Administered 2020-06-27: 09:00:00 40 mL

## 2020-06-13 MED ORDER — ATROPINE SULFATE 1 MG/10ML IJ SOSY
PREFILLED_SYRINGE | INTRAMUSCULAR | Status: AC
  Filled 2020-06-13: qty 10

## 2020-06-13 MED ORDER — THIAMINE HCL 100 MG/ML IJ SOLN
Freq: Three times a day (TID) | INTRAVENOUS | Status: DC
  Filled 2020-06-13 (×3): qty 50

## 2020-06-13 MED ORDER — THIAMINE HCL 100 MG/ML IJ SOLN
100.0000 mg | Freq: Every day | INTRAMUSCULAR | Status: DC
  Administered 2020-06-19: 13:00:00 100 mg via INTRAVENOUS
  Filled 2020-06-13 (×2): qty 2

## 2020-06-13 MED ORDER — THIAMINE HCL 100 MG/ML IJ SOLN
Freq: Every day | INTRAVENOUS | Status: AC
  Filled 2020-06-13 (×3): qty 25

## 2020-06-13 MED ORDER — DEXTROSE IN LACTATED RINGERS 5 % IV SOLN
INTRAVENOUS | Status: DC
  Administered 2020-06-13: 75 mL/h via INTRAVENOUS

## 2020-06-13 MED ORDER — ENOXAPARIN SODIUM 40 MG/0.4ML ~~LOC~~ SOLN
40.0000 mg | SUBCUTANEOUS | Status: DC
  Administered 2020-06-13 – 2020-06-16 (×4): 40 mg via SUBCUTANEOUS
  Filled 2020-06-13 (×4): qty 0.4

## 2020-06-13 MED ORDER — THIAMINE HCL 100 MG/ML IJ SOLN
Freq: Three times a day (TID) | INTRAVENOUS | Status: AC
  Filled 2020-06-13 (×6): qty 50

## 2020-06-13 MED ORDER — SODIUM CHLORIDE 0.9% FLUSH
10.0000 mL | INTRAVENOUS | Status: DC | PRN
  Administered 2020-06-13: 10 mL

## 2020-06-13 MED ORDER — THIAMINE HCL 100 MG/ML IJ SOLN: INTRAVENOUS | Status: DC

## 2020-06-13 MED ORDER — SODIUM CHLORIDE 0.45 % IV SOLN: INTRAVENOUS | Status: DC

## 2020-06-13 NOTE — Progress Notes (Signed)
Peripherally Inserted Central Catheter Placement  The IV Nurse has discussed with the patient and/or persons authorized to consent for the patient, the purpose of this procedure and the potential benefits and risks involved with this procedure.  The benefits include less needle sticks, lab draws from the catheter, and the patient may be discharged home with the catheter. Risks include, but not limited to, infection, bleeding, blood clot (thrombus formation), and puncture of an artery; nerve damage and irregular heartbeat and possibility to perform a PICC exchange if needed/ordered by physician.  Alternatives to this procedure were also discussed.  Bard Power PICC patient education guide, fact sheet on infection prevention and patient information card has been provided to patient /or left at bedside.    PICC Placement Documentation  PICC Triple Lumen 06/13/20 PICC Right Brachial 40 cm 0 cm (Active)  Exposed Catheter (cm) 0 cm 06/13/20 1629  Site Assessment Clean;Dry;Intact 06/13/20 1629  Lumen #1 Status Flushed;Blood return noted;Saline locked 06/13/20 1629  Lumen #2 Status Flushed;Blood return noted;Saline locked 06/13/20 1629  Lumen #3 Status Flushed;Blood return noted;Saline locked 06/13/20 1629  Dressing Type Transparent;Securing device 06/13/20 1629  Dressing Status Clean;Dry 06/13/20 1629  Antimicrobial disc in place? Yes 06/13/20 1629  Safety Lock Not Applicable 96/29/52 8413  Dressing Change Due 06/20/20 06/13/20 1629       Frances Maywood 06/13/2020, 4:31 PM

## 2020-06-13 NOTE — Plan of Care (Signed)
Pt is progressing slowly

## 2020-06-13 NOTE — Progress Notes (Signed)
Rough day. Lost all IV sites. Waited all day for PICC line insertion. Needed comfort and another RN to insert IV line. Patient verbally stated"Get this off of me!!" when sterile drape put over her face. Patient is still restless and difficult to calm afterm procedure.

## 2020-06-13 NOTE — Progress Notes (Signed)
Date of Admission:  06/09/2020      ID: Tonya Myers is a 55 y.o. female  Principal Problem:   Acute encephalopathy Active Problems:   CLL (chronic lymphocytic leukemia) (HCC)   Hypothyroidism   Essential hypertension   Uncontrolled type 2 diabetes mellitus with hyperglycemia, without long-term current use of insulin (HCC)   Aphasia   Hyperglycemic crisis in diabetes mellitus ([place])   Acute metabolic encephalopathy   Severe sepsis (HCC)   Pneumococcal meningitis   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HCC)   Epidural abscess    Subjective: As per the nurse she was more alert today.  Now she remains sedated because of agitation She had a PICC line placement   Medications:  . chlorhexidine  15 mL Mouth Rinse BID  . Chlorhexidine Gluconate Cloth  6 each Topical Q0600  . enoxaparin (LOVENOX) injection  40 mg Subcutaneous Q24H  . insulin aspart  0-15 Units Subcutaneous Q4H  . insulin glargine  25 Units Subcutaneous Daily  . levothyroxine  150 mcg Oral Q0600  . mouth rinse  15 mL Mouth Rinse q12n4p  . pantoprazole (PROTONIX) IV  40 mg Intravenous Q24H  . sodium chloride flush  3 mL Intravenous Q12H    Objective: Vital signs in last 24 hours: Temp:  [96.7 F (35.9 C)-98.9 F (37.2 C)] 98.9 F (37.2 C) (02/18 0748) Resp:  [19-27] 19 (02/18 0800) BP: (102-143)/(42-90) 134/63 (02/18 0800) SpO2:  [100 %] 100 % (02/17 1600) Weight:  [111.1 kg] 111.1 kg (02/18 0442)  PHYSICAL EXAM:  General: Sedated and sleeping Lungs: Bilateral air entry. Heart: Tachycardia Abdomen: Soft Foley Extremities: Right arm triple-lumen PICC atraumatic, no cyanosis. No edema. No clubbing Skin: No rashes or lesions. Or bruising Lymph: Cervical, supraclavicular normal. Neurologic: Cannot be assessed  Lab Results Recent Labs    06/11/20 0316 06/12/20 1027 06/13/20 0708  WBC 12.5* 17.0*  --   HGB 10.8* 8.6*  --   HCT 31.9* 26.3*  --   NA 145 149* 151*  K 3.7 4.5 4.6  CL 113* 117* 119*  CO2 21* 20*  18*  BUN 20 39* 44*  CREATININE 1.01* 1.13* 1.21*   Liver Panel Recent Labs    06/12/20 1027  PROT 5.9*  ALBUMIN 1.7*  AST 29  ALT 14  ALKPHOS 133*  BILITOT 1.4*   Sedimentation Rate No results for input(s): ESRSEDRATE in the last 72 hours. C-Reactive Protein No results for input(s): CRP in the last 72 hours.  Microbiology: 06/10/2020 blood culture strep pneumo 06/10/2020 CSF culture strep pneumo 06/12/2020 blood culture no growth Studies/Results: EEG  Result Date: 06/12/2020 Lora Havens, MD     06/12/2020  8:56 AM Patient Name: Tonya Myers MRN: [id number] Epilepsy Attending: Lora Havens Referring Physician/Provider: Dr. Neva Seat Date: 06/11/2020 Duration: 29.31 mins Patient history: 55 year old female with encephalopathy and pneumococcal meningitis.  EEG to eval for seizures. Level of alertness: Awake, asleep AEDs during EEG study: None Technical aspects: This EEG study was done with scalp electrodes positioned according to the 10-20 International system of electrode placement. Electrical activity was acquired at a sampling rate of 500Hz  and reviewed with a high frequency filter of 70Hz  and a low frequency filter of 1Hz . EEG data were recorded continuously and digitally stored. Description: No clear posterior dominant rhythm was seen. Sleep was characterized by vertex waves, sleep spindles (12 to 14 Hz), maximal frontocentral region.  EEG showed continuous generalized 3 to 6 Hz theta-delta slowing. Hyperventilation and photic stimulation were not performed.  ABNORMALITY -Continuous slow, generalized IMPRESSION: This study is suggestive of moderate diffuse encephalopathy, nonspecific etiology. No seizures or epileptiform discharges were seen throughout the recording. Lora Havens   CT HEAD WO CONTRAST  Result Date: 06/11/2020 CLINICAL DATA:  Sepsis.  Meningitis hydrocephalus. EXAM: CT HEAD WITHOUT CONTRAST TECHNIQUE: Contiguous axial images were obtained from the  base of the skull through the vertex without intravenous contrast. COMPARISON:  Head CT 06/09/2020.  MRI 06/10/2020. FINDINGS: Brain: The brain shows a normal appearance without evidence of malformation, atrophy, old or acute small or large vessel infarction, mass lesion, hemorrhage, hydrocephalus or extra-axial collection. Vascular: No hyperdense vessel. No evidence of atherosclerotic calcification. Skull: Normal.  No traumatic finding.  No focal bone lesion. Sinuses/Orbits: Sinuses are clear. Orbits appear normal. Mastoids are clear. Other: None significant IMPRESSION: Normal head CT. Electronically Signed   By: Nelson Chimes M.D.   On: 06/11/2020 15:47   MR CERVICAL SPINE WO CONTRAST  Result Date: 06/11/2020 CLINICAL DATA:  Pneumococcal meningitis. Neck and back pain. Evaluate for epidural abscess. EXAM: MRI CERVICAL, THORACIC AND LUMBAR SPINE WITHOUT CONTRAST TECHNIQUE: Multiplanar and multiecho pulse sequences of the cervical spine, to include the craniocervical junction and cervicothoracic junction, and thoracic and lumbar spine, were obtained without intravenous contrast. COMPARISON:  None FINDINGS: MRI CERVICAL SPINE FINDINGS Limited examination due to significant patient motion. Alignment: Normal alignment. Vertebrae: Grossly normal marrow signal. No bone lesions or fractures. No findings suspicious for discitis or osteomyelitis. Cord: Grossly normal cord signal intensity. No cord lesion or syrinx. Posterior Fossa, vertebral arteries, paraspinal tissues: No significant findings. Disc levels: C2-3: Shallow broad-based disc protrusion with mass effect on the ventral thecal sac. No significant foraminal stenosis. C3-4: Large central and left paracentral disc protrusion with mass effect on the thecal sac and flattening of the cervical cord. Mild foraminal stenosis bilaterally also. C4-5: Bulging degenerated annulus, osteophytic ridging and left paracentral disc protrusion with mass effect on the thecal sac.  There is also mild bilateral foraminal stenosis. C5-6: Degenerated bulging annulus, central disc protrusion and facet disease with mild bilateral foraminal stenosis. C6-7: Bulging degenerated annulus and osteophytic ridging with flattening of the ventral thecal sac asymmetric left. Mild left foraminal stenosis. C7-T1: Bulging degenerated annulus and left paracentral disc protrusion with mild mass effect on the left side of thecal sac. No significant foraminal stenosis. MRI THORACIC SPINE FINDINGS Alignment:  Normal alignment of the thoracic vertebral bodies. Vertebrae: Normal marrow signal. Small scattered hemangiomas but no worrisome bone lesions or fractures. No findings suspicious for discitis or osteomyelitis. The facets are normally aligned. No facet or laminar fractures. No evidence of facet septic arthritis. Cord: Grossly normal thoracic spinal cord. No obvious cord lesions or syrinx. Paraspinal and other soft tissues: No significant paraspinal findings. Disc levels: Small central disc protrusion at T6-7. MRI LUMBAR SPINE FINDINGS Segmentation: There are five lumbar type vertebral bodies. The last full intervertebral disc space is labeled L5-S1. Alignment:  Normal Vertebrae: No bone lesions or fractures. No findings suspicious for septic arthritis or osteomyelitis. Endplate reactive changes at L5-S1. Significant facet arthropathy noted on the left side at L2-3 and L3-4 with findings consistent with septic arthritis. Associated abscesses in the paraspinal muscles associated with both these levels. There is also significant surrounding myositis. Findings suspicious for a small epidural abscess on the left side at C2-3 associated with the infected left facet joint. No significant mass effect on the thecal sac. Conus medullaris and cauda equina: Conus extends to the L1-2 level. Conus and  cauda equina appear normal. Paraspinal and other soft tissues: Abscesses in the left paraspinal muscles at L2, L3 and L4. Disc  levels: L1-2: No significant findings. L2-3: No findings for discitis or osteomyelitis. Small posterior epidural abscess on the left side associated with the septic arthritis in the left L2-3 facet joint. No disc protrusions, spinal or foraminal stenosis. L3-4: No disc protrusions, spinal or foraminal stenosis. Septic arthritis involving the left L3-4 facet joint with an adjacent soft tissue abscess measuring approximately 18 x 15 mm. Smaller adjacent abscesses are also noted in the left paraspinal muscle. L4-5: No evidence of discitis or osteomyelitis. There is a shallow central disc protrusion with mass effect on the ventral thecal sac and mild bilateral lateral recess stenosis. No foraminal stenosis. The facet joints are intact at this level. There are some small abscesses in the left paraspinal muscle extending down just below the L4-5 disc space. L5-S1: Degenerative disc disease with endplate reactive changes. There is a shallow broad-based disc protrusion asymmetric left with mass effect on the thecal sac and on the left S1 nerve root. No foraminal stenosis. No septic arthritis involving the facet joints. IMPRESSION: 1. MR findings consistent with septic arthritis involving the left L2-3 and L3-4 facet joints with associated abscess is in the left paraspinal muscles. 2. Suspect small epidural abscess posteriorly at L2-3 on the left side. 3. No definite MR findings for discitis or osteomyelitis in the cervical, thoracic or lumbar spines. 4. Degenerative cervical spondylosis with multilevel disc disease and facet disease. Multilevel disc protrusions most significant at C3-4. Electronically Signed   By: Marijo Sanes M.D.   On: 06/11/2020 15:06   MR THORACIC SPINE WO CONTRAST  Result Date: 06/11/2020 CLINICAL DATA:  Pneumococcal meningitis. Neck and back pain. Evaluate for epidural abscess. EXAM: MRI CERVICAL, THORACIC AND LUMBAR SPINE WITHOUT CONTRAST TECHNIQUE: Multiplanar and multiecho pulse sequences of  the cervical spine, to include the craniocervical junction and cervicothoracic junction, and thoracic and lumbar spine, were obtained without intravenous contrast. COMPARISON:  None FINDINGS: MRI CERVICAL SPINE FINDINGS Limited examination due to significant patient motion. Alignment: Normal alignment. Vertebrae: Grossly normal marrow signal. No bone lesions or fractures. No findings suspicious for discitis or osteomyelitis. Cord: Grossly normal cord signal intensity. No cord lesion or syrinx. Posterior Fossa, vertebral arteries, paraspinal tissues: No significant findings. Disc levels: C2-3: Shallow broad-based disc protrusion with mass effect on the ventral thecal sac. No significant foraminal stenosis. C3-4: Large central and left paracentral disc protrusion with mass effect on the thecal sac and flattening of the cervical cord. Mild foraminal stenosis bilaterally also. C4-5: Bulging degenerated annulus, osteophytic ridging and left paracentral disc protrusion with mass effect on the thecal sac. There is also mild bilateral foraminal stenosis. C5-6: Degenerated bulging annulus, central disc protrusion and facet disease with mild bilateral foraminal stenosis. C6-7: Bulging degenerated annulus and osteophytic ridging with flattening of the ventral thecal sac asymmetric left. Mild left foraminal stenosis. C7-T1: Bulging degenerated annulus and left paracentral disc protrusion with mild mass effect on the left side of thecal sac. No significant foraminal stenosis. MRI THORACIC SPINE FINDINGS Alignment:  Normal alignment of the thoracic vertebral bodies. Vertebrae: Normal marrow signal. Small scattered hemangiomas but no worrisome bone lesions or fractures. No findings suspicious for discitis or osteomyelitis. The facets are normally aligned. No facet or laminar fractures. No evidence of facet septic arthritis. Cord: Grossly normal thoracic spinal cord. No obvious cord lesions or syrinx. Paraspinal and other soft  tissues: No significant paraspinal findings.  Disc levels: Small central disc protrusion at T6-7. MRI LUMBAR SPINE FINDINGS Segmentation: There are five lumbar type vertebral bodies. The last full intervertebral disc space is labeled L5-S1. Alignment:  Normal Vertebrae: No bone lesions or fractures. No findings suspicious for septic arthritis or osteomyelitis. Endplate reactive changes at L5-S1. Significant facet arthropathy noted on the left side at L2-3 and L3-4 with findings consistent with septic arthritis. Associated abscesses in the paraspinal muscles associated with both these levels. There is also significant surrounding myositis. Findings suspicious for a small epidural abscess on the left side at C2-3 associated with the infected left facet joint. No significant mass effect on the thecal sac. Conus medullaris and cauda equina: Conus extends to the L1-2 level. Conus and cauda equina appear normal. Paraspinal and other soft tissues: Abscesses in the left paraspinal muscles at L2, L3 and L4. Disc levels: L1-2: No significant findings. L2-3: No findings for discitis or osteomyelitis. Small posterior epidural abscess on the left side associated with the septic arthritis in the left L2-3 facet joint. No disc protrusions, spinal or foraminal stenosis. L3-4: No disc protrusions, spinal or foraminal stenosis. Septic arthritis involving the left L3-4 facet joint with an adjacent soft tissue abscess measuring approximately 18 x 15 mm. Smaller adjacent abscesses are also noted in the left paraspinal muscle. L4-5: No evidence of discitis or osteomyelitis. There is a shallow central disc protrusion with mass effect on the ventral thecal sac and mild bilateral lateral recess stenosis. No foraminal stenosis. The facet joints are intact at this level. There are some small abscesses in the left paraspinal muscle extending down just below the L4-5 disc space. L5-S1: Degenerative disc disease with endplate reactive changes.  There is a shallow broad-based disc protrusion asymmetric left with mass effect on the thecal sac and on the left S1 nerve root. No foraminal stenosis. No septic arthritis involving the facet joints. IMPRESSION: 1. MR findings consistent with septic arthritis involving the left L2-3 and L3-4 facet joints with associated abscess is in the left paraspinal muscles. 2. Suspect small epidural abscess posteriorly at L2-3 on the left side. 3. No definite MR findings for discitis or osteomyelitis in the cervical, thoracic or lumbar spines. 4. Degenerative cervical spondylosis with multilevel disc disease and facet disease. Multilevel disc protrusions most significant at C3-4. Electronically Signed   By: Marijo Sanes M.D.   On: 06/11/2020 15:06   MR LUMBAR SPINE WO CONTRAST  Result Date: 06/11/2020 CLINICAL DATA:  Pneumococcal meningitis. Neck and back pain. Evaluate for epidural abscess. EXAM: MRI CERVICAL, THORACIC AND LUMBAR SPINE WITHOUT CONTRAST TECHNIQUE: Multiplanar and multiecho pulse sequences of the cervical spine, to include the craniocervical junction and cervicothoracic junction, and thoracic and lumbar spine, were obtained without intravenous contrast. COMPARISON:  None FINDINGS: MRI CERVICAL SPINE FINDINGS Limited examination due to significant patient motion. Alignment: Normal alignment. Vertebrae: Grossly normal marrow signal. No bone lesions or fractures. No findings suspicious for discitis or osteomyelitis. Cord: Grossly normal cord signal intensity. No cord lesion or syrinx. Posterior Fossa, vertebral arteries, paraspinal tissues: No significant findings. Disc levels: C2-3: Shallow broad-based disc protrusion with mass effect on the ventral thecal sac. No significant foraminal stenosis. C3-4: Large central and left paracentral disc protrusion with mass effect on the thecal sac and flattening of the cervical cord. Mild foraminal stenosis bilaterally also. C4-5: Bulging degenerated annulus, osteophytic  ridging and left paracentral disc protrusion with mass effect on the thecal sac. There is also mild bilateral foraminal stenosis. C5-6: Degenerated bulging annulus,  central disc protrusion and facet disease with mild bilateral foraminal stenosis. C6-7: Bulging degenerated annulus and osteophytic ridging with flattening of the ventral thecal sac asymmetric left. Mild left foraminal stenosis. C7-T1: Bulging degenerated annulus and left paracentral disc protrusion with mild mass effect on the left side of thecal sac. No significant foraminal stenosis. MRI THORACIC SPINE FINDINGS Alignment:  Normal alignment of the thoracic vertebral bodies. Vertebrae: Normal marrow signal. Small scattered hemangiomas but no worrisome bone lesions or fractures. No findings suspicious for discitis or osteomyelitis. The facets are normally aligned. No facet or laminar fractures. No evidence of facet septic arthritis. Cord: Grossly normal thoracic spinal cord. No obvious cord lesions or syrinx. Paraspinal and other soft tissues: No significant paraspinal findings. Disc levels: Small central disc protrusion at T6-7. MRI LUMBAR SPINE FINDINGS Segmentation: There are five lumbar type vertebral bodies. The last full intervertebral disc space is labeled L5-S1. Alignment:  Normal Vertebrae: No bone lesions or fractures. No findings suspicious for septic arthritis or osteomyelitis. Endplate reactive changes at L5-S1. Significant facet arthropathy noted on the left side at L2-3 and L3-4 with findings consistent with septic arthritis. Associated abscesses in the paraspinal muscles associated with both these levels. There is also significant surrounding myositis. Findings suspicious for a small epidural abscess on the left side at C2-3 associated with the infected left facet joint. No significant mass effect on the thecal sac. Conus medullaris and cauda equina: Conus extends to the L1-2 level. Conus and cauda equina appear normal. Paraspinal and  other soft tissues: Abscesses in the left paraspinal muscles at L2, L3 and L4. Disc levels: L1-2: No significant findings. L2-3: No findings for discitis or osteomyelitis. Small posterior epidural abscess on the left side associated with the septic arthritis in the left L2-3 facet joint. No disc protrusions, spinal or foraminal stenosis. L3-4: No disc protrusions, spinal or foraminal stenosis. Septic arthritis involving the left L3-4 facet joint with an adjacent soft tissue abscess measuring approximately 18 x 15 mm. Smaller adjacent abscesses are also noted in the left paraspinal muscle. L4-5: No evidence of discitis or osteomyelitis. There is a shallow central disc protrusion with mass effect on the ventral thecal sac and mild bilateral lateral recess stenosis. No foraminal stenosis. The facet joints are intact at this level. There are some small abscesses in the left paraspinal muscle extending down just below the L4-5 disc space. L5-S1: Degenerative disc disease with endplate reactive changes. There is a shallow broad-based disc protrusion asymmetric left with mass effect on the thecal sac and on the left S1 nerve root. No foraminal stenosis. No septic arthritis involving the facet joints. IMPRESSION: 1. MR findings consistent with septic arthritis involving the left L2-3 and L3-4 facet joints with associated abscess is in the left paraspinal muscles. 2. Suspect small epidural abscess posteriorly at L2-3 on the left side. 3. No definite MR findings for discitis or osteomyelitis in the cervical, thoracic or lumbar spines. 4. Degenerative cervical spondylosis with multilevel disc disease and facet disease. Multilevel disc protrusions most significant at C3-4. Electronically Signed   By: Marijo Sanes M.D.   On: 06/11/2020 15:06   ECHOCARDIOGRAM COMPLETE  Result Date: 06/12/2020    ECHOCARDIOGRAM REPORT   Patient Name:   CHELSA STOUT Date of Exam: 06/11/2020 Medical Rec #:  [id number]       Height:       72.0  in Accession #:    [id number]      Weight:       245.0  lb Date of Birth:  July 12, 1965       BSA:          2.322 m Patient Age:    61 years        BP:           126/73 mmHg Patient Gender: F               HR:           75 bpm. Exam Location:  [facility] Procedure: 2D Echo, Cardiac Doppler and Color Doppler Indications:     R78.81 Bacteremia  History:         Patient has no prior history of Echocardiogram examinations.                  Risk Factors:Hypertension and Diabetes. Hypothyroidism.  Sonographer:     Wilford Sports Rodgers-Jones Referring Phys:  [id number] [name] Diagnosing Phys: Ida Rogue MD IMPRESSIONS  1. Left ventricular ejection fraction, by estimation, is 50 to 55%. The left ventricle has low normal function. The left ventricle has no regional wall motion abnormalities. There is mild left ventricular hypertrophy. Left ventricular diastolic parameters were normal.  2. Right ventricular systolic function is normal. The right ventricular size is normal. There is normal pulmonary artery systolic pressure. The estimated right ventricular systolic pressure is 21.1 mmHg.  3. The mitral valve is normal in structure. Mild mitral valve regurgitation. No evidence of mitral stenosis.  4. The aortic valve is grossly normal in structure, though difficult to fully visualize. Aortic valve regurgitation is not visualized. No aortic stenosis is present.  5. Grossly, no valve vegetation noted. FINDINGS  Left Ventricle: Left ventricular ejection fraction, by estimation, is 50 to 55%. The left ventricle has low normal function. The left ventricle has no regional wall motion abnormalities. The left ventricular internal cavity size was normal in size. There is mild left ventricular hypertrophy. Left ventricular diastolic parameters were normal. Right Ventricle: The right ventricular size is normal. No increase in right ventricular wall thickness. Right ventricular systolic function is normal. There is normal pulmonary  artery systolic pressure. The tricuspid regurgitant velocity is 2.13 m/s, and  with an assumed right atrial pressure of 5 mmHg, the estimated right ventricular systolic pressure is 94.1 mmHg. Left Atrium: Left atrial size was normal in size. Right Atrium: Right atrial size was normal in size. Pericardium: There is no evidence of pericardial effusion. Mitral Valve: The mitral valve is normal in structure. Mild mitral valve regurgitation. No evidence of mitral valve stenosis. Tricuspid Valve: The tricuspid valve is normal in structure. Tricuspid valve regurgitation is mild . No evidence of tricuspid stenosis. Aortic Valve: The aortic valve is normal in structure. Aortic valve regurgitation is not visualized. No aortic stenosis is present. Pulmonic Valve: The pulmonic valve was normal in structure. Pulmonic valve regurgitation is not visualized. No evidence of pulmonic stenosis. Aorta: The aortic root is normal in size and structure. Venous: The inferior vena cava is normal in size with greater than 50% respiratory variability, suggesting right atrial pressure of 3 mmHg. IAS/Shunts: No atrial level shunt detected by color flow Doppler.  LEFT VENTRICLE PLAX 2D LVIDd:         4.39 cm  Diastology LVIDs:         3.09 cm  LV e' medial:    7.40 cm/s LV PW:         1.03 cm  LV E/e' medial:  11.7 LV IVS:  1.10 cm  LV e' lateral:   9.36 cm/s LVOT diam:     1.80 cm  LV E/e' lateral: 9.3 LV SV:         56 LV SV Index:   24 LVOT Area:     2.54 cm  RIGHT VENTRICLE             IVC RV Basal diam:  3.51 cm     IVC diam: 2.46 cm RV S prime:     10.10 cm/s LEFT ATRIUM             Index       RIGHT ATRIUM           Index LA diam:        4.10 cm 1.77 cm/m  RA Area:     12.50 cm LA Vol (A2C):   63.4 ml 27.30 ml/m RA Volume:   30.60 ml  13.18 ml/m LA Vol (A4C):   39.6 ml 17.05 ml/m LA Biplane Vol: 53.7 ml 23.12 ml/m  AORTIC VALVE LVOT Vmax:   108.00 cm/s LVOT Vmean:  71.100 cm/s LVOT VTI:    0.219 m  AORTA Ao Root diam: 3.10 cm  MITRAL VALVE               TRICUSPID VALVE MV Area (PHT): 4.21 cm    TR Peak grad:   18.1 mmHg MV Decel Time: 180 msec    TR Vmax:        213.00 cm/s MV E velocity: 86.80 cm/s MV A velocity: 57.70 cm/s  SHUNTS MV E/A ratio:  1.50        Systemic VTI:  0.22 m                            Systemic Diam: 1.80 cm Ida Rogue MD Electronically signed by Ida Rogue MD Signature Date/Time: 06/12/2020/7:33:57 AM    Final    [place] EKG SITE RITE  Result Date: 06/13/2020 If Site Rite image not attached, placement could not be confirmed due to current cardiac rhythm.  [place] EKG SITE RITE  Result Date: 06/13/2020 If Site Rite image not attached, placement could not be confirmed due to current cardiac rhythm.    Assessment/Plan:  Streptococcus pneumonia bacteremia Streptococcus pneumonia meningitis On ceftriaxone 2 g IV every 12.  May switch to IV penicillin continuous infusion in 48 hours.  Epidural abscess L2-L3 along with infected left facet joint  L2-L3 and L3-L4 septic arthritis of the facet joint.  Associated paraspinal abscess Patient is going to need minimum of 6 weeks of IV antibiotics  Neurosurgery evaluated the patient and did not think she needed any surgery. Discussed with interventional radiologist.  As the paraspinal abscesses are very small it will not yield much on aspiration.  With the patient conditions does not improve then we may repeat imaging and consider drainage if needed.  AKI secondary to the infection  Hypernatremia needs free fluid.  Leukocytosis improving  Discussed the management with her nurse. ID will follow her peripherally this weekend.  Call if needed.

## 2020-06-13 NOTE — Progress Notes (Signed)
Patient ID: Tonya Myers, female   DOB: [birth year], 55 y.o.   MRN: [id number] [facility] PROGRESS NOTE  Tonya Myers LOV:[id number] DOB: [birth year] DOA: 06/09/2020 PCP: [name], MD  HPI/Subjective: Patient this morning was able to answer some questions and follow some simple commands.  She was able to squeeze my hands and straight leg raise.  She was able to talk.  Still on sedative medications.  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  Objective: Vitals:   06/13/20 0748 06/13/20 0800  BP:  134/63  Pulse:    Resp:  19  Temp: 98.9 F (37.2 C)   SpO2:      Intake/Output Summary (Last 24 hours) at 06/13/2020 1440 Last data filed at 06/13/2020 0500 Gross per 24 hour  Intake --  Output 995 ml  Net -995 ml   Filed Weights   06/09/20 1905 06/13/20 0442  Weight: 111.1 kg 111.1 kg    ROS: Review of Systems  Respiratory: Negative for shortness of breath.   Cardiovascular: Negative for chest pain.  Gastrointestinal: Negative for abdominal pain.  Musculoskeletal: Negative for joint pain.   Exam: Physical Exam HENT:     Head: Normocephalic.     Mouth/Throat:     Pharynx: No oropharyngeal exudate.  Eyes:     General: Lids are normal.     Conjunctiva/sclera: Conjunctivae normal.  Cardiovascular:     Rate and Rhythm: Normal rate and regular rhythm.     Heart sounds: Normal heart sounds, S1 normal and S2 normal.  Pulmonary:     Breath sounds: No decreased breath sounds, wheezing, rhonchi or rales.  Abdominal:     Palpations: Abdomen is soft.     Tenderness: There is no abdominal tenderness.  Musculoskeletal:     Right lower leg: No swelling.     Left lower leg: No swelling.  Skin:    General: Skin is warm.     Findings: No rash.  Neurological:     Mental Status: She is lethargic.     Comments: Able to answer few questions and follow some simple commands today.       Data Reviewed: Basic Metabolic Panel: Recent Labs   Lab 06/09/20 1157 06/09/20 2037 06/10/20 0600 06/11/20 0316 06/12/20 1027 06/13/20 0708  NA 137 133* 139 145 149* 151*  K 4.1 4.7 3.7 3.7 4.5 4.6  CL 104 100 106 113* 117* 119*  CO2 17* 18* 21* 21* 20* 18*  GLUCOSE 274* 455* 191* 308* 199* 174*  BUN 20 30* 24* 20 39* 44*  CREATININE 1.35* 1.56* 1.17* 1.01* 1.13* 1.21*  CALCIUM 9.3 9.2 9.1 8.9 9.0 8.8*  MG 1.8  --   --   --   --  2.4  PHOS 3.8  --   --   --   --  5.5*   Liver Function Tests: Recent Labs  Lab 06/09/20 1157 06/09/20 2037 06/10/20 0600 06/12/20 1027  AST 50* 44* 39 29  ALT 36 31 27 14   ALKPHOS 200* 211* 204* 133*  BILITOT 2.1* 2.2* 2.4* 1.4*  PROT 7.3 7.6 7.2 5.9*  ALBUMIN 2.8* 2.8* 2.4* 1.7*   Recent Labs  Lab 06/09/20 1157  LIPASE 29   Recent Labs  Lab 06/09/20 2145  AMMONIA 14   CBC: Recent Labs  Lab 06/09/20 1157 06/09/20 2037 06/10/20 0600 06/11/20 0316 06/12/20 1027  WBC 15.5* 16.0* 12.2* 12.5* 17.0*  HGB 10.1* 10.1* 12.3 10.8* 8.6*  HCT 31.4* 31.0* 37.1 31.9*  26.3*  MCV 80.1 79.3* 78.1* 77.1* 77.6*  PLT 330 369 286 272 232    CBG: Recent Labs  Lab 06/12/20 1921 06/12/20 2306 06/13/20 0337 06/13/20 0733 06/13/20 1116  GLUCAP 143* 98 164* 162* 160*    Recent Results (from the past 240 hour(s))  Resp Panel by RT-PCR (Flu A&B, Covid) Nasopharyngeal Swab     Status: None   Collection Time: 06/09/20  9:45 PM   Specimen: Nasopharyngeal Swab; Nasopharyngeal(NP) swabs in vial transport medium  Result Value Ref Range Status   SARS Coronavirus 2 by RT PCR NEGATIVE NEGATIVE Final    Comment: (NOTE) SARS-CoV-2 target nucleic acids are NOT DETECTED.  The SARS-CoV-2 RNA is generally detectable in upper respiratory specimens during the acute phase of infection. The lowest concentration of SARS-CoV-2 viral copies this assay can detect is 138 copies/mL. A negative result does not preclude SARS-Cov-2 infection and should not be used as the sole basis for treatment or other patient  management decisions. A negative result may occur with  improper specimen collection/handling, submission of specimen other than nasopharyngeal swab, presence of viral mutation(s) within the areas targeted by this assay, and inadequate number of viral copies(<138 copies/mL). A negative result must be combined with clinical observations, patient history, and epidemiological information. The expected result is Negative.  Fact Sheet for Patients:  EntrepreneurPulse.com.au  Fact Sheet for Healthcare Providers:  IncredibleEmployment.be  This test is no t yet approved or cleared by the [place] FDA and  has been authorized for detection and/or diagnosis of SARS-CoV-2 by FDA under an Emergency Use Authorization (EUA). This EUA will remain  in effect (meaning this test can be used) for the duration of the COVID-19 declaration under Section 564(b)(1) of the Act, 21 U.S.C.section 360bbb-3(b)(1), unless the authorization is terminated  or revoked sooner.       Influenza A by PCR NEGATIVE NEGATIVE Final   Influenza B by PCR NEGATIVE NEGATIVE Final    Comment: (NOTE) The Xpert Xpress SARS-CoV-2/FLU/RSV plus assay is intended as an aid in the diagnosis of influenza from Nasopharyngeal swab specimens and should not be used as a sole basis for treatment. Nasal washings and aspirates are unacceptable for Xpert Xpress SARS-CoV-2/FLU/RSV testing.  Fact Sheet for Patients: EntrepreneurPulse.com.au  Fact Sheet for Healthcare Providers: IncredibleEmployment.be  This test is not yet approved or cleared by the [place] FDA and has been authorized for detection and/or diagnosis of SARS-CoV-2 by FDA under an Emergency Use Authorization (EUA). This EUA will remain in effect (meaning this test can be used) for the duration of the COVID-19 declaration under Section 564(b)(1) of the Act, 21 U.S.C. section 360bbb-3(b)(1),  unless the authorization is terminated or revoked.  Performed at [facility], [address] [place], [place] [address]   CULTURE, BLOOD (ROUTINE X 2) w Reflex to ID Panel     Status: Abnormal   Collection Time: 06/10/20  9:40 AM   Specimen: BLOOD  Result Value Ref Range Status   Specimen Description   Final    BLOOD BLOOD LEFT FOREARM Performed at [facility], [address], [place], [place] [address]    Special Requests   Final    BOTTLES DRAWN AEROBIC AND ANAEROBIC Blood Culture adequate volume Performed at [facility], [address] [place],  [address]    Culture  Setup Time   Final    Organism ID to follow IN BOTH AEROBIC AND ANAEROBIC BOTTLES GRAM POSITIVE COCCI CRITICAL RESULT CALLED TO, READ  BACK BY AND VERIFIED WITH: SUSAN WATSON @1941  ON 06/10/20 SKL Performed at [facility], [address] [place], [place] [address]    Culture STREPTOCOCCUS PNEUMONIAE (A)  Final   Report Status 06/13/2020 FINAL  Final   Organism ID, Bacteria STREPTOCOCCUS PNEUMONIAE  Final      Susceptibility   Streptococcus pneumoniae - MIC*    ERYTHROMYCIN >=8 RESISTANT Resistant     LEVOFLOXACIN 0.5 SENSITIVE Sensitive     VANCOMYCIN 0.5 SENSITIVE Sensitive     PENICILLIN (meningitis) <=0.06 SENSITIVE Sensitive     PENO - penicillin <=0.06      PENICILLIN (non-meningitis) <=0.06 SENSITIVE Sensitive     PENICILLIN (oral) <=0.06 SENSITIVE Sensitive     CEFTRIAXONE (non-meningitis) <=0.12 SENSITIVE Sensitive     CEFTRIAXONE (meningitis) <=0.12 SENSITIVE Sensitive     * STREPTOCOCCUS PNEUMONIAE  Blood Culture ID Panel (Reflexed)     Status: Abnormal   Collection Time: 06/10/20  9:40 AM  Result Value Ref Range Status   Enterococcus faecalis NOT DETECTED NOT DETECTED Final   Enterococcus Faecium NOT DETECTED NOT DETECTED Final   Listeria monocytogenes NOT DETECTED NOT DETECTED Final   Staphylococcus species NOT DETECTED NOT DETECTED Final    Staphylococcus aureus (BCID) NOT DETECTED NOT DETECTED Final   Staphylococcus epidermidis NOT DETECTED NOT DETECTED Final   Staphylococcus lugdunensis NOT DETECTED NOT DETECTED Final   Streptococcus species DETECTED (A) NOT DETECTED Final    Comment: CRITICAL RESULT CALLED TO, READ BACK BY AND VERIFIED WITH: SUSAN WATSON @1941  ON 06/10/20 SKL    Streptococcus agalactiae NOT DETECTED NOT DETECTED Final   Streptococcus pneumoniae DETECTED (A) NOT DETECTED Final    Comment: CRITICAL RESULT CALLED TO, READ BACK BY AND VERIFIED WITH: SUSAN WATSON @1941  ON 06/10/20 SKL    Streptococcus pyogenes NOT DETECTED NOT DETECTED Final   A.calcoaceticus-baumannii NOT DETECTED NOT DETECTED Final   Bacteroides fragilis NOT DETECTED NOT DETECTED Final   Enterobacterales NOT DETECTED NOT DETECTED Final   Enterobacter cloacae complex NOT DETECTED NOT DETECTED Final   Escherichia coli NOT DETECTED NOT DETECTED Final   Klebsiella aerogenes NOT DETECTED NOT DETECTED Final   Klebsiella oxytoca NOT DETECTED NOT DETECTED Final   Klebsiella pneumoniae NOT DETECTED NOT DETECTED Final   Proteus species NOT DETECTED NOT DETECTED Final   Salmonella species NOT DETECTED NOT DETECTED Final   Serratia marcescens NOT DETECTED NOT DETECTED Final   Haemophilus influenzae NOT DETECTED NOT DETECTED Final   Neisseria meningitidis NOT DETECTED NOT DETECTED Final   Pseudomonas aeruginosa NOT DETECTED NOT DETECTED Final   Stenotrophomonas maltophilia NOT DETECTED NOT DETECTED Final   Candida albicans NOT DETECTED NOT DETECTED Final   Candida auris NOT DETECTED NOT DETECTED Final   Candida glabrata NOT DETECTED NOT DETECTED Final   Candida krusei NOT DETECTED NOT DETECTED Final   Candida parapsilosis NOT DETECTED NOT DETECTED Final   Candida tropicalis NOT DETECTED NOT DETECTED Final   Cryptococcus neoformans/gattii NOT DETECTED NOT DETECTED Final    Comment: Performed at [facility], [address]  [place], [place] [address]  CULTURE, BLOOD (ROUTINE X 2) w Reflex to ID Panel     Status: None (Preliminary result)   Collection Time: 06/10/20 11:26 AM   Specimen: BLOOD  Result Value Ref Range Status   Specimen Description BLOOD LEFT ARM  Final   Special Requests   Final    BOTTLES DRAWN AEROBIC ONLY Blood Culture adequate volume   Culture   Final  NO GROWTH 3 DAYS Performed at [facility], [address] [place], [place] [address]    Report Status PENDING  Incomplete  CSF culture w Stat Gram Stain     Status: None   Collection Time: 06/10/20  2:19 PM   Specimen: CSF; Cerebrospinal Fluid  Result Value Ref Range Status   Specimen Description   Final    CSF Performed at [facility], [address], [place], [place] [address]    Special Requests   Final    NONE Performed at [facility], [place], [place] [address]    Gram Stain   Final    GRAM POSITIVE DIPLOCOCCI CRITICAL RESULT CALLED TO, READ BACK BY AND VERIFIED WITH: KORY ANDREWS @1627  06/10/20 MJU WBC SEEN RBC SEEN Performed at [facility], [address] [place], [place] [address]    Culture MODERATE STREPTOCOCCUS PNEUMONIAE  Final   Report Status 06/12/2020 FINAL  Final   Organism ID, Bacteria STREPTOCOCCUS PNEUMONIAE  Final      Susceptibility   Streptococcus pneumoniae - MIC*    ERYTHROMYCIN >=8 RESISTANT Resistant     LEVOFLOXACIN 1 SENSITIVE Sensitive     VANCOMYCIN 0.25 SENSITIVE Sensitive     PENICILLIN (meningitis) <=0.06 SENSITIVE Sensitive     PENO - penicillin <=0.06      PENICILLIN (non-meningitis) <=0.06 SENSITIVE Sensitive     PENICILLIN (oral) <=0.06 SENSITIVE Sensitive     CEFTRIAXONE (non-meningitis) <=0.12 SENSITIVE Sensitive     CEFTRIAXONE (meningitis) <=0.12 SENSITIVE Sensitive     * MODERATE STREPTOCOCCUS PNEUMONIAE  MRSA PCR Screening     Status: None   Collection Time: 06/11/20  4:56 AM   Specimen: Nasopharyngeal  Result Value Ref  Range Status   MRSA by PCR NEGATIVE NEGATIVE Final    Comment:        The GeneXpert MRSA Assay (FDA approved for NASAL specimens only), is one component of a comprehensive MRSA colonization surveillance program. It is not intended to diagnose MRSA infection nor to guide or monitor treatment for MRSA infections. Performed at [facility], [address] [place], [place] [address]   Culture, blood (Routine X 2) w Reflex to ID Panel     Status: None (Preliminary result)   Collection Time: 06/12/20 12:41 AM   Specimen: BLOOD  Result Value Ref Range Status   Specimen Description BLOOD BLOOD RIGHT HAND  Final   Special Requests   Final    BOTTLES DRAWN AEROBIC AND ANAEROBIC Blood Culture adequate volume   Culture   Final    NO GROWTH 1 DAY Performed at [facility], [address], [place], [place] [address]    Report Status PENDING  Incomplete  Culture, blood (Routine X 2) w Reflex to ID Panel     Status: None (Preliminary result)   Collection Time: 06/12/20 12:41 AM   Specimen: BLOOD  Result Value Ref Range Status   Specimen Description BLOOD BLOOD LEFT HAND  Final   Special Requests IN PEDIATRIC BOTTLE Blood Culture adequate volume  Final   Culture   Final    NO GROWTH 1 DAY Performed at [facility], [address] [place],  [address]    Report Status PENDING  Incomplete     Studies: EEG  Result Date: 06/12/2020 Lora Havens, MD     06/12/2020  8:56 AM Patient Name: Lavelle Berland MRN: [id number] Epilepsy Attending: Lora Havens Referring Physician/Provider: Dr. Neva Seat Date: 06/11/2020  Duration: 29.31 mins Patient history: 55 year old female with encephalopathy and pneumococcal meningitis.  EEG to eval for seizures. Level of alertness: Awake, asleep AEDs during EEG study: None Technical aspects: This EEG study was done with scalp electrodes positioned according to the 10-20 International system of electrode placement.  Electrical activity was acquired at a sampling rate of 500Hz  and reviewed with a high frequency filter of 70Hz  and a low frequency filter of 1Hz . EEG data were recorded continuously and digitally stored. Description: No clear posterior dominant rhythm was seen. Sleep was characterized by vertex waves, sleep spindles (12 to 14 Hz), maximal frontocentral region.  EEG showed continuous generalized 3 to 6 Hz theta-delta slowing. Hyperventilation and photic stimulation were not performed.   ABNORMALITY -Continuous slow, generalized IMPRESSION: This study is suggestive of moderate diffuse encephalopathy, nonspecific etiology. No seizures or epileptiform discharges were seen throughout the recording. Lora Havens   CT HEAD WO CONTRAST  Result Date: 06/11/2020 CLINICAL DATA:  Sepsis.  Meningitis hydrocephalus. EXAM: CT HEAD WITHOUT CONTRAST TECHNIQUE: Contiguous axial images were obtained from the base of the skull through the vertex without intravenous contrast. COMPARISON:  Head CT 06/09/2020.  MRI 06/10/2020. FINDINGS: Brain: The brain shows a normal appearance without evidence of malformation, atrophy, old or acute small or large vessel infarction, mass lesion, hemorrhage, hydrocephalus or extra-axial collection. Vascular: No hyperdense vessel. No evidence of atherosclerotic calcification. Skull: Normal.  No traumatic finding.  No focal bone lesion. Sinuses/Orbits: Sinuses are clear. Orbits appear normal. Mastoids are clear. Other: None significant IMPRESSION: Normal head CT. Electronically Signed   By: Nelson Chimes M.D.   On: 06/11/2020 15:47   ECHOCARDIOGRAM COMPLETE  Result Date: 06/12/2020    ECHOCARDIOGRAM REPORT   Patient Name:   DONAE KUEKER Date of Exam: 06/11/2020 Medical Rec #:  [id number]       Height:       72.0 in Accession #:    [id number]      Weight:       245.0 lb Date of Birth:  07/04/1965       BSA:          2.322 m Patient Age:    37 years        BP:           126/73 mmHg Patient Gender:  F               HR:           75 bpm. Exam Location:  [facility] Procedure: 2D Echo, Cardiac Doppler and Color Doppler Indications:     R78.81 Bacteremia  History:         Patient has no prior history of Echocardiogram examinations.                  Risk Factors:Hypertension and Diabetes. Hypothyroidism.  Sonographer:     Wilford Sports Rodgers-Jones Referring Phys:  [id number] [name] Diagnosing Phys: Ida Rogue MD IMPRESSIONS  1. Left ventricular ejection fraction, by estimation, is 50 to 55%. The left ventricle has low normal function. The left ventricle has no regional wall motion abnormalities. There is mild left ventricular hypertrophy. Left ventricular diastolic parameters were normal.  2. Right ventricular systolic function is normal. The right ventricular size is normal. There is normal pulmonary artery systolic pressure. The estimated right ventricular systolic pressure is 02.5 mmHg.  3. The mitral valve is normal in structure. Mild mitral valve regurgitation. No evidence of mitral stenosis.  4. The aortic valve is  grossly normal in structure, though difficult to fully visualize. Aortic valve regurgitation is not visualized. No aortic stenosis is present.  5. Grossly, no valve vegetation noted. FINDINGS  Left Ventricle: Left ventricular ejection fraction, by estimation, is 50 to 55%. The left ventricle has low normal function. The left ventricle has no regional wall motion abnormalities. The left ventricular internal cavity size was normal in size. There is mild left ventricular hypertrophy. Left ventricular diastolic parameters were normal. Right Ventricle: The right ventricular size is normal. No increase in right ventricular wall thickness. Right ventricular systolic function is normal. There is normal pulmonary artery systolic pressure. The tricuspid regurgitant velocity is 2.13 m/s, and  with an assumed right atrial pressure of 5 mmHg, the estimated right ventricular systolic pressure is 98.3 mmHg.  Left Atrium: Left atrial size was normal in size. Right Atrium: Right atrial size was normal in size. Pericardium: There is no evidence of pericardial effusion. Mitral Valve: The mitral valve is normal in structure. Mild mitral valve regurgitation. No evidence of mitral valve stenosis. Tricuspid Valve: The tricuspid valve is normal in structure. Tricuspid valve regurgitation is mild . No evidence of tricuspid stenosis. Aortic Valve: The aortic valve is normal in structure. Aortic valve regurgitation is not visualized. No aortic stenosis is present. Pulmonic Valve: The pulmonic valve was normal in structure. Pulmonic valve regurgitation is not visualized. No evidence of pulmonic stenosis. Aorta: The aortic root is normal in size and structure. Venous: The inferior vena cava is normal in size with greater than 50% respiratory variability, suggesting right atrial pressure of 3 mmHg. IAS/Shunts: No atrial level shunt detected by color flow Doppler.  LEFT VENTRICLE PLAX 2D LVIDd:         4.39 cm  Diastology LVIDs:         3.09 cm  LV e' medial:    7.40 cm/s LV PW:         1.03 cm  LV E/e' medial:  11.7 LV IVS:        1.10 cm  LV e' lateral:   9.36 cm/s LVOT diam:     1.80 cm  LV E/e' lateral: 9.3 LV SV:         56 LV SV Index:   24 LVOT Area:     2.54 cm  RIGHT VENTRICLE             IVC RV Basal diam:  3.51 cm     IVC diam: 2.46 cm RV S prime:     10.10 cm/s LEFT ATRIUM             Index       RIGHT ATRIUM           Index LA diam:        4.10 cm 1.77 cm/m  RA Area:     12.50 cm LA Vol (A2C):   63.4 ml 27.30 ml/m RA Volume:   30.60 ml  13.18 ml/m LA Vol (A4C):   39.6 ml 17.05 ml/m LA Biplane Vol: 53.7 ml 23.12 ml/m  AORTIC VALVE LVOT Vmax:   108.00 cm/s LVOT Vmean:  71.100 cm/s LVOT VTI:    0.219 m  AORTA Ao Root diam: 3.10 cm MITRAL VALVE               TRICUSPID VALVE MV Area (PHT): 4.21 cm    TR Peak grad:   18.1 mmHg MV Decel Time: 180 msec    TR Vmax:  213.00 cm/s MV E velocity: 86.80 cm/s MV A velocity:  57.70 cm/s  SHUNTS MV E/A ratio:  1.50        Systemic VTI:  0.22 m                            Systemic Diam: 1.80 cm Ida Rogue MD Electronically signed by Ida Rogue MD Signature Date/Time: 06/12/2020/7:33:57 AM    Final    [place] EKG SITE RITE  Result Date: 06/13/2020 If Site Rite image not attached, placement could not be confirmed due to current cardiac rhythm.  [place] EKG SITE RITE  Result Date: 06/13/2020 If Site Rite image not attached, placement could not be confirmed due to current cardiac rhythm.   Scheduled Meds: . chlorhexidine  15 mL Mouth Rinse BID  . Chlorhexidine Gluconate Cloth  6 each Topical Q0600  . enoxaparin (LOVENOX) injection  40 mg Subcutaneous Q24H  . insulin aspart  0-15 Units Subcutaneous Q4H  . insulin glargine  25 Units Subcutaneous Daily  . levothyroxine  150 mcg Oral Q0600  . mouth rinse  15 mL Mouth Rinse q12n4p  . pantoprazole (PROTONIX) IV  40 mg Intravenous Q24H  . sodium chloride flush  3 mL Intravenous Q12H   Continuous Infusions: . sodium chloride    . cefTRIAXone (ROCEPHIN)  IV 2 g (06/12/20 2240)  . dexmedetomidine (PRECEDEX) IV infusion 0.8 mcg/kg/hr (06/13/20 1114)    Assessment/Plan:  1. 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  Patient has acute metabolic encephalopathy and acute kidney injury.  Patient's blood cultures and CSF cultures were positive for pneumococcus.  Continue high-dose Rocephin as per ID.  Repeat blood cultures so far negative.  PICC line ordered for today. 2. Paraspinal abscess and small epidural abscess, septic arthritis L2-L3 and L3-L4.  Neurosurgery and IR recommends serial imaging.  Patient on Rocephin. 3. Acute metabolic encephalopathy.  The patient this morning was able to follow some simple commands and answer few questions.  She was still on sedation with low-dose Precedex and as needed fentanyl. 4. DKA on presentation.  Off insulin drip and now on Lantus insulin sliding  scale 5. Acute kidney injury.  Creatinine 1.56 on 03/29/2021 and went as low as 1.01 on 04/10/2021.  Today's creatinine 1.21. 6. Hypernatremia switch IV fluids over to half-normal saline with sodium of 151 today. 7. Drop in hemoglobin.  Still awaiting today's hemoglobin.  Yesterday's was 8.6.  Teds and SCDs for DVT prophylaxis, Lovenox for DVT prophylaxis and watch hemoglobin.  Empiric Protonix. 8. Obesity with a BMI of 33.22 9. History of hypothyroidism on levothyroxine if able to take 10. Elevated liver function test secondary to sepsis 11. History of CLL     Code Status:     Code Status Orders  (From admission, onward)         Start     Ordered   06/09/20 2313  Full code  Continuous        06/09/20 2314        Code Status History    This patient has a current code status but no historical code status.   Advance Care Planning Activity     Family Communication: Spoke with [name] on the phone Disposition Plan: Status is: Inpatient  Dispo: The patient is from: Home              Anticipated d/c is to: Potentially rehab  Anticipated d/c date is: May end up needing another week here in the hospital              Patient currently not ready for discharge yet with impaired mental status and receiving IV antibiotics.   Difficult to place patient.  Hopefully not.  Consultants:  Infectious disease  Neurology  Seen by neurosurgery  Procedures:  Lumbar puncture on 04/09/2021  Antibiotics:  IV Rocephin  Time spent: 27 minutes  [place]

## 2020-06-13 NOTE — Progress Notes (Signed)
NEUROLOGY CONSULTATION PROGRESS NOTE   Date of service: June 13, 2020 Patient Name: Tonya Myers MRN:  [id number] DOB:  [birth year]  Brief HPI  Tonya Myers is a 55 y.o. female admitted with aphasia out of proportion to encephalopathy, fever and tachycardia x 2 days. 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   Interval Hx   More awake, more interactive but slowed. Appears to have neck pain on passive movment of her head.  Vitals   Vitals:   06/13/20 0442 06/13/20 0500 06/13/20 0748 06/13/20 0800  BP:  (!) 143/90  134/63  Pulse:      Resp:  (!) 23  19  Temp:   98.9 F (37.2 C)   TempSrc:   Axillary   SpO2:      Weight: 111.1 kg     Height:         Body mass index is 33.22 kg/m.  Physical Exam   General: Laying in bed; in no acute distress. HENT: Normal oropharynx and mucosa. Normal external appearance of ears and nose. Dry MM Neck: No gross stiffness but has pain and tenderness on moving head passively. CV: No JVD. No peripheral edema. Pulmonary: Symmetric Chest rise. Tachypneic. Abdomen: Soft to touch, non-tender. Ext: No cyanosis, edema, or deformity Skin: No rash. Normal palpation of skin. Musculoskeletal: Normal digits and nails by inspection. No clubbing.  Neurologic Examination  Mental status/Cognition: Awake, eyes open, slow responses,follows simple commands with encouragement- was able to give a thumbs up and wiggles toes on command. Poor attention concentration and difficult to keep engaged in conversation-needs a lot of coaching Speech/language: Can name thumb. Significantly dysarthric speech and difficult to comprehend. Cranial nerves:   CN II Pupils equal and reactive to light, blinks to threat BL.   CN III,IV,VI EOM intact -followed commands to look both sides   CN V    CN VII no asymmetry, no nasolabial fold flattening   CN VIII Turns head towards speech.            Motor:  Muscle bulk: normal, tone  normal Moves all extremities spontaneously and gives a thumbs up on commands wiggles toes bilaterally to command. Her  Reflexes:2+ all over  Sensation: intact - responds with a yes to touch in all extremities.  Coordination/Complex Motor:  Does appear somewhat incoordinated.  Labs   Basic Metabolic Panel:  Lab Results  Component Value Date   NA 151 (H) 06/13/2020   K 4.6 06/13/2020   CO2 18 (L) 06/13/2020   GLUCOSE 174 (H) 06/13/2020   BUN 44 (H) 06/13/2020   CREATININE 1.21 (H) 06/13/2020   CALCIUM 8.8 (L) 06/13/2020   GFRNONAA 53 (L) 06/13/2020   GFRAA >60 01/17/2020   HbA1c:  Lab Results  Component Value Date   HGBA1C 8.0 (H) 06/09/2020   Imaging and Diagnostic studies  CSF Studies: Turbid CSF WBC: 63,442. RBCs: 20,290 CSF Glucose: < 20 CSF Protein: > 600  MR BRAIN WO CONTRAST Normal brain MRI.  MRI C, T, L spine w/o contrast:  IMPRESSION: 1. MR findings consistent with septic arthritis involving the left L2-3 and L3-4 facet joints with associated abscess is in the left paraspinal muscles. 2. Suspect small epidural abscess posteriorly at L2-3 on the left side. 3. No definite MR findings for discitis or osteomyelitis in the cervical, thoracic or lumbar spines. 4. Degenerative cervical spondylosis with multilevel disc disease and facet disease. Multilevel disc protrusions most significant at C3-4.  No cord compression seen on  C, T or L-spine.  Repeat CTH - normal - no evidence of hydrocephalus  Routine EEG completed last night: IMPRESSION: This study is suggestive of moderate diffuse encephalopathy, nonspecific etiology. No seizures or epileptiform discharges were seen throughout the recording.   Impression   Tonya Myers is a 55 y.o. female admitted with Pneumococcal Meningitis and Sepsis. Neuro exam with moderate to severe encephalopathy but somewhat improved from previous documented exams - follows some commands with improvement in wakefullness  but continues to have poor attention/concentration. MR whole spine c/w septic arthritis of lumbar spine with associated paraspinal muscle abscess. Repeat head imaging not suggestive of hydrocephalus of increased ICP.  Overall picture consistent with meningitis and septic arthritis/paraspinal muscle abscess of the lumbar spine. Not floridly myelopathic on exam and no imaging evidence of cord compression.  No evidence of seizures on EEG  Recommendations  -Mainstay of treatment will be abx - ID on board. Duration TBD by ID in light of the spine findings. -NSGY consulted - no emergent intervention. -Stat CTH if mentation worsens or is more somnolent or has CN abnormalities pop up on exam -No need for AEDs  Neurology will follow. _______________________________________________________________  Donnetta Simpers [facility] Pager Number [phone]

## 2020-06-13 NOTE — Progress Notes (Signed)
SLP Cancellation Note  Patient Details Name: Tonya Myers MRN: [id number] DOB: 06/20/1965   Cancelled treatment:       Reason Eval/Treat Not Completed: Medical issues which prohibited therapy.  Chart reviewed and pt's nurse stated pt is unable to follow any directions and her status is slow to improve. She advised against PO trials and said it would take "days" before she would be ready. As such, please re-consult ST when pt becomes appropriate for PO trials.    Aelyn Stanaland B. Rutherford Nail M.S., CCC-SLP, [place] Office [phone]  Stormy Fabian 06/13/2020, 1:49 PM

## 2020-06-13 NOTE — Progress Notes (Signed)
NAME:  Tonya Myers, MRN:  [id number], DOB:  02-Feb-1966, LOS: 4 ADMISSION DATE:  06/09/2020, INITIAL CONSULTATION DATE:  06/12/2020 REFERRING MD:  Dr. Leslye Peer, CHIEF COMPLAINT:  Altered Mental Status  Brief History:  55 y.o. 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  Requiring Precedex.  History of Present Illness:  Tonya Myers is a 55 y.o. Female with a past medical history as listed below who presented to [facility] ED on 06/09/2020 due to Altered Mental Status and expressive Aphasia.  She also reported weakness and back pain.     Workup revealed DKA, AKI, and elevated LFT's. CT Head and MR Head were both negative.  She exhibited nuchal rigidity concerning for potential CNS infection.  She was placed on broad spectrum coverage with Vancomycin, Cefepime, Ampicillin, and Acyclovir.  Neurology and Infectious Diseases were consulted. Lumbar Puncture was performed on 06/10/20.  Both blood cultures and CSF culture are positive for Pneumococcus.  EEG is negative for seizures.  MR of the Cervical/thoracic/lumbar spine is concerning for septic arthritis/paraspinal  Muscle abscess of the lumbar spine.  Neurosurgery has been consulted.    While in the ICU she has required Precedex infusion due to severe agitation and altered mental status. 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   Past Medical History:  Hypothyroidism Hypertension Diabetes mellitus Depression Chronic lymphocytic leukemia Chronic back pain Asthma Anxiety General herpes COVID-19 03/2020  Significant Hospital Events:  2/14: To be admitted to [facility], however remain in ED due to bed availability issues 2/15: Lumbar puncture performed 2/16: Blood cultures and CSF cultures +  for Pneumococcus 2/17: PCCM consulted due to Precedex 2/18: Mental status greatly improved, Precedex almost off  Consults:  Hospitalist (primary service) Neurology Infectious Diseases Neurosurgery PCCM  Procedures:  2/15: Lumbar Puncture  Significant Diagnostic Tests:  2/14: CT Head w/o contrast>>No acute intracranial abnormality. 2/15: X-ray Eye>>No evidence of metallic foreign body within the orbits. 2/15: X-ray Pelvis>>There is no evidence of pelvic fracture or diastasis. No pelvic bone lesions are seen. 2/15: MR Brain w/o contrast>>Normal brain MRI. 2/16: MR Cervical/Thoracic/Lumbar Spine>>1. MR findings consistent with septic arthritis involving the left L2-3 and L3-4 facet joints with associated abscess is in the left paraspinal muscles. 2. Suspect small epidural abscess posteriorly at L2-3 on the left side. 3. No definite MR findings for discitis or osteomyelitis in the cervical, thoracic or lumbar spines. 4. Degenerative cervical spondylosis with multilevel disc disease and facet disease. Multilevel disc protrusions most significant at C3-4. 2/16: CT Head w/o contrast>>Normal head CT. 2/16: Echocardiogram>>1. Left ventricular ejection fraction, by estimation, is 50 to 55%. The  left ventricle has low normal function. The left ventricle has no regional  wall motion abnormalities. There is mild left ventricular hypertrophy.  Left ventricular diastolic  parameters were normal.  2. Right ventricular systolic function is normal. The right ventricular  size is normal. There is normal pulmonary artery systolic pressure. The  estimated right ventricular systolic pressure is 06.3 mmHg.  3. The mitral valve is normal in structure. Mild mitral valve  regurgitation. No evidence of mitral stenosis.  4. The aortic valve is grossly normal in structure, though difficult to  fully visualize. Aortic valve regurgitation is not visualized. No aortic  stenosis is present.  5.  Grossly, no valve vegetation noted.  2/16: EEG>>This study is suggestive  of moderate diffuse encephalopathy, nonspecific etiology. No seizures or epileptiform discharges were seen throughout the recording.  Micro Data:  2/14: SARS-CoV-2 PCR>>negative 2/14: Influenza A&B PCR>>negative 2/15: RPR>>non reactive 2/15: Blood culture>>Streptococcus pneumoniae 2/15: CSF culture>>STREPTOCOCCUS PNEUMONIAE  2/16: MRSA PCR>>negative 2/16: HIV screen>>non reactive 2/16: Strep Pneumo urinary antigen>> POSITIVE 2/17: Blood culture x2>>  Antimicrobials:  Cefepime 2/15 x1 dose Ceftriaxone 2/15>> Vancomycin 2/15>>  Interim History / Subjective:  -Pt currently calm on Precedex, following commands, interactive, Precedex being weaned off -Hemodynamically stable, Afebrile, protecting her airway   Objective   Blood pressure 134/63, pulse 65, temperature 98.9 F (37.2 C), temperature source Axillary, resp. rate 19, height 6' (1.829 m), weight 111.1 kg, SpO2 100 %.        Intake/Output Summary (Last 24 hours) at 06/13/2020 0945 Last data filed at 06/13/2020 0500 Gross per 24 hour  Intake 3 ml  Output 1095 ml  Net -1092 ml   Filed Weights   06/09/20 1905 06/13/20 0442  Weight: 111.1 kg 111.1 kg    Examination: General: Acutely ill-appearing female, laying in bed, calm on Precedex, interacts HENT: Atraumatic, normocephalic, neck supple, no JVD Lungs: Clear to auscultation bilaterally, even, nonlabored, normal effort Cardiovascular: Regular rate and rhythm, S1-S2, no murmurs, rubs, gallops Abdomen: Obese, soft, nontender, nondistended, no guarding or rebound tenderness, bowel sounds positive x4 Extremities: Normal bulk and tone, no deformities, no clubbing, no edema Neuro: Lethargic, intermittently opens eyes and groans, purposeful movement all extremities, does not follow commands, pupils PERRLA GU: Deferred Skin: Warm and dry.  No obvious rashes, lesions, ulcerations  Resolved Hospital  Problem list   DKA  Assessment & Plan:   Sepsis & Pneumococcal BACTEREMIA in the setting of Pneumococcal Meningitis Septic Arthritis/Paraspinal Muscle Abscess of the Lumbar Spine (L2-L3 & L3-L4) -Monitor fever curve -Trend WBC's -Follow cultures as above -ID following, appreciate input -Antibiotics as per ID ~ currently on Ceftriaxone and Vancomycin -Neurology following, appreciate input -Neurosurgery consulted, appreciate input -Facet infection, continue antibiotics, no surgery -Follow-up MRI in 1 to 2 weeks per neurosurgery recommendations   Acute Metabolic Encephalopathy in the setting of Pneumococcal Meningitis -Provide supportive care -Precedex being weaned off -Markedly improved -Currently protecting her airway -Avoid sedating meds as able -Continue Antibiotics as per ID -CT Head and MR Head negative -EEG negative for seizures -Urine drug screen positive for Tricyclics   AKI>>improved -Monitor I&O's / urinary output -Follow BMP -Ensure adequate renal perfusion -Avoid nephrotoxic agents as able -Replace electrolytes as indicated -IV Fluids   DKA>>resolved Diabetes Mellitus -CBG's -SSI and Lantus -Follow ICU Hypo/hyperglycemia protocol   Hypothyroidism -Continue home synthroid  Best practice (evaluated daily)  Diet: NPO Pain/Anxiety/Delirium protocol (if indicated): Precedex, prn Fentanyl VAP protocol (if indicated): N/A DVT prophylaxis: SCD's GI prophylaxis: N/A Glucose control: SSI, Lantus Mobility: Bedrest Disposition:SDU  Goals of Care:  Last date of multidisciplinary goals of care discussion: 06/12/20 Family and staff present: Rn at bedside; no family available for update during NP rounds Summary of discussion: Continue ABX as per ID, Neurosurgery consulted, wean precedex as able Follow up goals of care discussion due: 06/13/20 Code Status: Full Code  Labs   CBC: Recent Labs  Lab 06/09/20 1157 06/09/20 2037 06/10/20 0600 06/11/20 0316  06/12/20 1027  WBC 15.5* 16.0* 12.2* 12.5* 17.0*  HGB 10.1* 10.1* 12.3 10.8* 8.6*  HCT 31.4* 31.0* 37.1 31.9* 26.3*  MCV 80.1 79.3* 78.1* 77.1* 77.6*  PLT 330 369 286 272 419    Basic Metabolic Panel: Recent Labs  Lab  06/09/20 1157 06/09/20 2037 06/10/20 0600 06/11/20 0316 06/12/20 1027 06/13/20 0708  NA 137 133* 139 145 149* 151*  K 4.1 4.7 3.7 3.7 4.5 4.6  CL 104 100 106 113* 117* 119*  CO2 17* 18* 21* 21* 20* 18*  GLUCOSE 274* 455* 191* 308* 199* 174*  BUN 20 30* 24* 20 39* 44*  CREATININE 1.35* 1.56* 1.17* 1.01* 1.13* 1.21*  CALCIUM 9.3 9.2 9.1 8.9 9.0 8.8*  MG 1.8  --   --   --   --  2.4  PHOS 3.8  --   --   --   --  5.5*   GFR: Estimated Creatinine Clearance: 72.4 mL/min (A) (by C-G formula based on SCr of 1.21 mg/dL (H)). Recent Labs  Lab 06/09/20 2037 06/10/20 0600 06/11/20 0316 06/11/20 1007 06/12/20 1027  WBC 16.0* 12.2* 12.5*  --  17.0*  LATICACIDVEN  --   --  2.4* 1.5  --     Liver Function Tests: Recent Labs  Lab 06/09/20 1157 06/09/20 2037 06/10/20 0600 06/12/20 1027  AST 50* 44* 39 29  ALT 36 31 27 14   ALKPHOS 200* 211* 204* 133*  BILITOT 2.1* 2.2* 2.4* 1.4*  PROT 7.3 7.6 7.2 5.9*  ALBUMIN 2.8* 2.8* 2.4* 1.7*   Recent Labs  Lab 06/09/20 1157  LIPASE 29   Recent Labs  Lab 06/09/20 2145  AMMONIA 14    ABG    Component Value Date/Time   PHART 7.48 (H) 06/12/2020 1451   PCO2ART 27 (L) 06/12/2020 1451   PO2ART 94 06/12/2020 1451   HCO3 20.1 06/12/2020 1451   ACIDBASEDEF 2.7 (H) 06/12/2020 1451   O2SAT 97.8 06/12/2020 1451     Coagulation Profile: Recent Labs  Lab 06/10/20 1126  INR 1.3*    Cardiac Enzymes: No results for input(s): CKTOTAL, CKMB, CKMBINDEX, TROPONINI in the last 168 hours.  HbA1C: Hgb A1c MFr Bld  Date/Time Value Ref Range Status  06/09/2020 08:37 PM 8.0 (H) 4.8 - 5.6 % Final    Comment:    (NOTE)         Prediabetes: 5.7 - 6.4         Diabetes: >6.4         Glycemic control for adults with  diabetes: <7.0     CBG: Recent Labs  Lab 06/12/20 1518 06/12/20 1921 06/12/20 2306 06/13/20 0337 06/13/20 0733  GLUCAP 191* 143* 98 164* 162*    Review of Systems:   Unable to assess due to AMS  Past Medical History:  She,  has a past medical history of Acute hemorrhoid (03/11/2015), Anxiety, Arthritis, Asthma, Cancer ([place]), Chronic back pain, CLL (chronic lymphocytic leukemia) ([place]), Depression, Diabetes mellitus without complication ([place]), Genital herpes, Hypertension, Hypothyroidism, and Vertigo.   Surgical History:   Past Surgical History:  Procedure Laterality Date  . ABDOMINAL HYSTERECTOMY    . BILATERAL SALPINGOOPHORECTOMY  2009  . BREAST BIOPSY Right 05/17/2016   FIBROADENOMATOUS CHANGE AND SCLEROSING ADENOSIS WITH  . COLONOSCOPY WITH PROPOFOL N/A 06/16/2015   Procedure: COLONOSCOPY WITH PROPOFOL;  Surgeon: [name], MD;  Location: [facility] ENDOSCOPY;  Service: Endoscopy;  Laterality: N/A;  . LAPAROSCOPIC SUPRACERVICAL HYSTERECTOMY  2009   due to [place]  . OOPHORECTOMY       Social History:   reports that she has never smoked. She has never used smokeless tobacco. She reports current alcohol use of about 1.0 standard drink of alcohol per week. She reports that she does not use drugs.  Family History:  Her family history includes Breast cancer (age of onset: 20) in her maternal aunt; Cancer in her paternal aunt.   Allergies No Known Allergies   Home Medications  Prior to Admission medications   Medication Sig Start Date End Date Taking? Authorizing Provider  cyclobenzaprine (FLEXERIL) 10 MG tablet Take 10 mg by mouth 3 (three) times daily. 06/04/20  Yes [provider]  diltiazem (DILACOR XR) 120 MG 24 hr capsule Take 120 mg by mouth daily.  12/26/14  Yes [provider]  estradiol (ESTRACE) 0.5 MG tablet Take 0.5 mg by mouth daily. 03/14/18  Yes [provider]  ezetimibe (ZETIA) 10 MG tablet Take 10 mg by mouth daily.    Yes  [provider]  furosemide (LASIX) 20 MG tablet Take 20 mg by mouth daily.   Yes [provider]  Insulin Glargine-Lixisenatide 100-33 UNT-MCG/ML SOPN Inject 48 Units into the skin daily before breakfast. 01/07/20  Yes [provider]  insulin lispro (HUMALOG) 200 UNIT/ML KwikPen Inject 100 Units into the skin daily. Sliding scale average of 10 units   Yes [provider]  levocetirizine (XYZAL) 5 MG tablet Take 5 mg by mouth at bedtime. 04/02/20  Yes [provider]  levothyroxine (SYNTHROID, LEVOTHROID) 150 MCG tablet Take 150 mcg by mouth daily before breakfast.    Yes [provider]  metFORMIN (GLUCOPHAGE-XR) 500 MG 24 hr tablet Take 500 mg by mouth 2 (two) times daily. 04/02/20  Yes [provider]  montelukast (SINGULAIR) 10 MG tablet Take 10 mg by mouth at bedtime.   Yes [provider]  naproxen (NAPROSYN) 500 MG tablet Take 500 mg by mouth 2 (two) times daily. 06/03/20  Yes [provider]  pregabalin (LYRICA) 75 MG capsule Take 75 mg by mouth 2 (two) times daily. 06/05/20  Yes [provider]  rosuvastatin (CRESTOR) 10 MG tablet Take 10 mg by mouth at bedtime. 04/01/20  Yes [provider]  telmisartan-hydrochlorothiazide (MICARDIS HCT) 80-25 MG tablet Take 1 tablet by mouth daily. 06/09/20  Yes [provider]  valACYclovir (VALTREX) 1000 MG tablet Take 1 tablet (1,000 mg total) by mouth 2 (two) times daily. 07/20/18  Yes [name], MD  budesonide-formoterol (SYMBICORT) 160-4.5 MCG/ACT inhaler Inhale into the lungs. 07/14/18 03/15/19  [provider]     Level 3 follow-up    Multidisciplinary rounds were performed with ICU team.  Care coordination was discussed with the bedside nurse.   Patient is markedly improved.  Encephalopathy clearing.  Continue antibiotics.  Precedex should be off by end of day.  PCCM sign off please reconsult as needed.    Renold Don, MD [facility]   *This note was dictated using voice recognition software/Dragon.  Despite best efforts to proofread, errors can occur which can change the meaning.  Any change was purely unintentional.

## 2020-06-14 DIAGNOSIS — G001 Pneumococcal meningitis: Secondary | ICD-10-CM | POA: Diagnosis not present

## 2020-06-14 DIAGNOSIS — M4620 Osteomyelitis of vertebra, site unspecified: Secondary | ICD-10-CM | POA: Diagnosis not present

## 2020-06-14 DIAGNOSIS — A419 Sepsis, unspecified organism: Secondary | ICD-10-CM | POA: Diagnosis not present

## 2020-06-14 DIAGNOSIS — G062 Extradural and subdural abscess, unspecified: Secondary | ICD-10-CM | POA: Diagnosis not present

## 2020-06-14 LAB — FERRITIN: Ferritin: 824 ng/mL — ABNORMAL HIGH (ref 11–307)

## 2020-06-14 LAB — CBC
HCT: 25.4 % — ABNORMAL LOW (ref 36.0–46.0)
Hemoglobin: 8.2 g/dL — ABNORMAL LOW (ref 12.0–15.0)
MCH: 26 pg (ref 26.0–34.0)
MCHC: 32.3 g/dL (ref 30.0–36.0)
MCV: 80.6 fL (ref 80.0–100.0)
Platelets: 230 10*3/uL (ref 150–400)
RBC: 3.15 MIL/uL — ABNORMAL LOW (ref 3.87–5.11)
RDW: 16.8 % — ABNORMAL HIGH (ref 11.5–15.5)
WBC: 12.5 10*3/uL — ABNORMAL HIGH (ref 4.0–10.5)
nRBC: 0 % (ref 0.0–0.2)

## 2020-06-14 LAB — BASIC METABOLIC PANEL
Anion gap: 8 (ref 5–15)
BUN: 41 mg/dL — ABNORMAL HIGH (ref 6–20)
CO2: 23 mmol/L (ref 22–32)
Calcium: 8.6 mg/dL — ABNORMAL LOW (ref 8.9–10.3)
Chloride: 122 mmol/L — ABNORMAL HIGH (ref 98–111)
Creatinine, Ser: 1.12 mg/dL — ABNORMAL HIGH (ref 0.44–1.00)
GFR, Estimated: 58 mL/min — ABNORMAL LOW (ref >60)
Glucose, Bld: 303 mg/dL — ABNORMAL HIGH (ref 70–99)
Potassium: 4 mmol/L (ref 3.5–5.1)
Sodium: 153 mmol/L — ABNORMAL HIGH (ref 135–145)

## 2020-06-14 LAB — IRON AND TIBC
Iron: 46 ug/dL (ref 28–170)
Saturation Ratios: 36 % — ABNORMAL HIGH (ref 10.4–31.8)
TIBC: 127 ug/dL — ABNORMAL LOW (ref 250–450)
UIBC: 81 ug/dL

## 2020-06-14 LAB — VITAMIN B12: Vitamin B-12: 1901 pg/mL — ABNORMAL HIGH (ref 180–914)

## 2020-06-14 LAB — GLUCOSE, CAPILLARY
Glucose-Capillary: 265 mg/dL — ABNORMAL HIGH (ref 70–99)
Glucose-Capillary: 225 mg/dL — ABNORMAL HIGH (ref 70–99)
Glucose-Capillary: 180 mg/dL — ABNORMAL HIGH (ref 70–99)
Glucose-Capillary: 229 mg/dL — ABNORMAL HIGH (ref 70–99)
Glucose-Capillary: 335 mg/dL — ABNORMAL HIGH (ref 70–99)
Glucose-Capillary: 303 mg/dL — ABNORMAL HIGH (ref 70–99)

## 2020-06-14 LAB — PHOSPHORUS: Phosphorus: 3.7 mg/dL (ref 2.5–4.6)

## 2020-06-14 LAB — MAGNESIUM: Magnesium: 2.9 mg/dL — ABNORMAL HIGH (ref 1.7–2.4)

## 2020-06-14 LAB — LACTATE DEHYDROGENASE: LDH: 126 U/L (ref 98–192)

## 2020-06-14 MED ORDER — OXYCODONE HCL 5 MG PO TABS
5.0000 mg | ORAL_TABLET | ORAL | Status: DC | PRN
  Administered 2020-06-14 – 2020-06-27 (×30): 5 mg via ORAL
  Filled 2020-06-14 (×32): qty 1

## 2020-06-14 MED ORDER — OXYCODONE HCL 5 MG PO TABS: 5.0000 mg | ORAL_TABLET | ORAL | Status: DC | PRN

## 2020-06-14 MED ORDER — INSULIN GLARGINE 100 UNIT/ML ~~LOC~~ SOLN
15.0000 [IU] | Freq: Two times a day (BID) | SUBCUTANEOUS | Status: DC
  Administered 2020-06-14 (×2): 15 [IU] via SUBCUTANEOUS
  Filled 2020-06-14 (×4): qty 0.15

## 2020-06-14 MED ORDER — INSULIN ASPART 100 UNIT/ML ~~LOC~~ SOLN
0.0000 [IU] | SUBCUTANEOUS | Status: DC
  Administered 2020-06-14 (×2): 7 [IU] via SUBCUTANEOUS
  Administered 2020-06-14: 15 [IU] via SUBCUTANEOUS
  Administered 2020-06-14: 11 [IU] via SUBCUTANEOUS
  Administered 2020-06-14: 4 [IU] via SUBCUTANEOUS
  Administered 2020-06-15 (×2): 11 [IU] via SUBCUTANEOUS
  Filled 2020-06-14 (×7): qty 1

## 2020-06-14 NOTE — Progress Notes (Signed)
Patient ID: Tonya Myers, female   DOB: 07-23-1965, 55 y.o.   MRN: [id number] [facility] PROGRESS NOTE  Raha Tennison NLZ:[id number] DOB: 1965/12/12 DOA: 06/09/2020 PCP: [name], MD  HPI/Subjective: Patient is a little more responsive today.  Answering questions and following some simple commands.  Speech therapy was able to put on a dysphagia diet.  Slow improvement with severe sepsis and pneumococcal meningitis.  Objective: Vitals:   06/14/20 1500 06/14/20 1600  BP: (!) 145/75 (!) 151/83  Pulse:    Resp: (!) 22 (!) 26  Temp:  98.6 F (37 C)  SpO2: 100% 100%    Intake/Output Summary (Last 24 hours) at 06/14/2020 1705 Last data filed at 06/14/2020 1631 Gross per 24 hour  Intake 4599.59 ml  Output 1400 ml  Net 3199.59 ml   Filed Weights   06/09/20 1905 06/13/20 0442 06/14/20 0447  Weight: 111.1 kg 111.1 kg 111.1 kg    ROS: Review of Systems  Respiratory: Negative for cough and shortness of breath.   Cardiovascular: Negative for chest pain.  Gastrointestinal: Negative for abdominal pain, nausea and vomiting.   Exam: Physical Exam HENT:     Head: Normocephalic.     Mouth/Throat:     Pharynx: No oropharyngeal exudate.  Eyes:     General: Lids are normal.     Extraocular Movements: Extraocular movements intact.     Conjunctiva/sclera: Conjunctivae normal.     Pupils: Pupils are equal, round, and reactive to light.  Cardiovascular:     Rate and Rhythm: Normal rate and regular rhythm.     Heart sounds: Normal heart sounds, S1 normal and S2 normal.  Pulmonary:     Breath sounds: No decreased breath sounds, wheezing, rhonchi or rales.  Abdominal:     Palpations: Abdomen is soft.     Tenderness: There is no abdominal tenderness.  Musculoskeletal:     Right lower leg: Swelling present.     Left lower leg: Swelling present.  Skin:    General: Skin is warm.     Findings: No rash.  Neurological:     Mental Status: She is confused.     Comments: Answers  some simple yes or no questions.  Able to move all of her extremities.  Still very weak.       Data Reviewed: Basic Metabolic Panel: Recent Labs  Lab 06/09/20 1157 06/09/20 2037 06/10/20 0600 06/11/20 0316 06/12/20 1027 06/13/20 0708 06/14/20 0446  NA 137   < > 139 145 149* 151* 153*  K 4.1   < > 3.7 3.7 4.5 4.6 4.0  CL 104   < > 106 113* 117* 119* 122*  CO2 17*   < > 21* 21* 20* 18* 23  GLUCOSE 274*   < > 191* 308* 199* 174* 303*  BUN 20   < > 24* 20 39* 44* 41*  CREATININE 1.35*   < > 1.17* 1.01* 1.13* 1.21* 1.12*  CALCIUM 9.3   < > 9.1 8.9 9.0 8.8* 8.6*  MG 1.8  --   --   --   --  2.4 2.9*  PHOS 3.8  --   --   --   --  5.5* 3.7   < > = values in this interval not displayed.   Liver Function Tests: Recent Labs  Lab 06/09/20 1157 06/09/20 2037 06/10/20 0600 06/12/20 1027  AST 50* 44* 39 29  ALT 36 31 27 14   ALKPHOS 200* 211* 204* 133*  BILITOT 2.1* 2.2* 2.4* 1.4*  PROT 7.3 7.6 7.2 5.9*  ALBUMIN 2.8* 2.8* 2.4* 1.7*   Recent Labs  Lab 06/09/20 1157  LIPASE 29   Recent Labs  Lab 06/09/20 2145  AMMONIA 14   CBC: Recent Labs  Lab 06/10/20 0600 06/11/20 0316 06/12/20 1027 06/13/20 1702 06/14/20 0446  WBC 12.2* 12.5* 17.0* 11.4* 12.5*  NEUTROABS  --   --   --  8.2*  --   HGB 12.3 10.8* 8.6* 7.1* 8.2*  HCT 37.1 31.9* 26.3* 21.7* 25.4*  MCV 78.1* 77.1* 77.6* 80.4 80.6  PLT 286 272 232 181 230    CBG: Recent Labs  Lab 06/13/20 2313 06/14/20 0314 06/14/20 0715 06/14/20 1121 06/14/20 1615  GLUCAP 256* 265* 225* 180* 229*    Recent Results (from the past 240 hour(s))  Resp Panel by RT-PCR (Flu A&B, Covid) Nasopharyngeal Swab     Status: None   Collection Time: 06/09/20  9:45 PM   Specimen: Nasopharyngeal Swab; Nasopharyngeal(NP) swabs in vial transport medium  Result Value Ref Range Status   SARS Coronavirus 2 by RT PCR NEGATIVE NEGATIVE Final    Comment: (NOTE) SARS-CoV-2 target nucleic acids are NOT DETECTED.  The SARS-CoV-2 RNA is  generally detectable in upper respiratory specimens during the acute phase of infection. The lowest concentration of SARS-CoV-2 viral copies this assay can detect is 138 copies/mL. A negative result does not preclude SARS-Cov-2 infection and should not be used as the sole basis for treatment or other patient management decisions. A negative result may occur with  improper specimen collection/handling, submission of specimen other than nasopharyngeal swab, presence of viral mutation(s) within the areas targeted by this assay, and inadequate number of viral copies(<138 copies/mL). A negative result must be combined with clinical observations, patient history, and epidemiological information. The expected result is Negative.  Fact Sheet for Patients:  EntrepreneurPulse.com.au  Fact Sheet for Healthcare Providers:  IncredibleEmployment.be  This test is no t yet approved or cleared by the [place] FDA and  has been authorized for detection and/or diagnosis of SARS-CoV-2 by FDA under an Emergency Use Authorization (EUA). This EUA will remain  in effect (meaning this test can be used) for the duration of the COVID-19 declaration under Section 564(b)(1) of the Act, 21 U.S.C.section 360bbb-3(b)(1), unless the authorization is terminated  or revoked sooner.       Influenza A by PCR NEGATIVE NEGATIVE Final   Influenza B by PCR NEGATIVE NEGATIVE Final    Comment: (NOTE) The Xpert Xpress SARS-CoV-2/FLU/RSV plus assay is intended as an aid in the diagnosis of influenza from Nasopharyngeal swab specimens and should not be used as a sole basis for treatment. Nasal washings and aspirates are unacceptable for Xpert Xpress SARS-CoV-2/FLU/RSV testing.  Fact Sheet for Patients: EntrepreneurPulse.com.au  Fact Sheet for Healthcare Providers: IncredibleEmployment.be  This test is not yet approved or cleared by the [place] FDA and has been authorized for detection and/or diagnosis of SARS-CoV-2 by FDA under an Emergency Use Authorization (EUA). This EUA will remain in effect (meaning this test can be used) for the duration of the COVID-19 declaration under Section 564(b)(1) of the Act, 21 U.S.C. section 360bbb-3(b)(1), unless the authorization is terminated or revoked.  Performed at [facility], [address] [place], [place] [address]   CULTURE, BLOOD (ROUTINE X 2) w Reflex to ID Panel     Status: Abnormal   Collection Time: 06/10/20  9:40 AM   Specimen: BLOOD  Result Value Ref Range Status   Specimen Description  Final    BLOOD BLOOD LEFT FOREARM Performed at [facility], [address] [place], [place] [address]    Special Requests   Final    BOTTLES DRAWN AEROBIC AND ANAEROBIC Blood Culture adequate volume Performed at [facility], [address] [place], [place] [address]    Culture  Setup Time   Final    Organism ID to follow IN BOTH AEROBIC AND ANAEROBIC BOTTLES GRAM POSITIVE COCCI CRITICAL RESULT CALLED TO, READ BACK BY AND VERIFIED WITH: SUSAN WATSON @1941  ON 06/10/20 SKL Performed at [facility], [address] [place], [place] [address]    Culture STREPTOCOCCUS PNEUMONIAE (A)  Final   Report Status 06/13/2020 FINAL  Final   Organism ID, Bacteria STREPTOCOCCUS PNEUMONIAE  Final      Susceptibility   Streptococcus pneumoniae - MIC*    ERYTHROMYCIN >=8 RESISTANT Resistant     LEVOFLOXACIN 0.5 SENSITIVE Sensitive     VANCOMYCIN 0.5 SENSITIVE Sensitive     PENICILLIN (meningitis) <=0.06 SENSITIVE Sensitive     PENO - penicillin <=0.06      PENICILLIN (non-meningitis) <=0.06 SENSITIVE Sensitive     PENICILLIN (oral) <=0.06 SENSITIVE Sensitive     CEFTRIAXONE (non-meningitis) <=0.12 SENSITIVE Sensitive     CEFTRIAXONE (meningitis) <=0.12 SENSITIVE Sensitive     * STREPTOCOCCUS PNEUMONIAE  Blood Culture ID Panel (Reflexed)      Status: Abnormal   Collection Time: 06/10/20  9:40 AM  Result Value Ref Range Status   Enterococcus faecalis NOT DETECTED NOT DETECTED Final   Enterococcus Faecium NOT DETECTED NOT DETECTED Final   Listeria monocytogenes NOT DETECTED NOT DETECTED Final   Staphylococcus species NOT DETECTED NOT DETECTED Final   Staphylococcus aureus (BCID) NOT DETECTED NOT DETECTED Final   Staphylococcus epidermidis NOT DETECTED NOT DETECTED Final   Staphylococcus lugdunensis NOT DETECTED NOT DETECTED Final   Streptococcus species DETECTED (A) NOT DETECTED Final    Comment: CRITICAL RESULT CALLED TO, READ BACK BY AND VERIFIED WITH: SUSAN WATSON @1941  ON 06/10/20 SKL    Streptococcus agalactiae NOT DETECTED NOT DETECTED Final   Streptococcus pneumoniae DETECTED (A) NOT DETECTED Final    Comment: CRITICAL RESULT CALLED TO, READ BACK BY AND VERIFIED WITH: SUSAN WATSON @1941  ON 06/10/20 SKL    Streptococcus pyogenes NOT DETECTED NOT DETECTED Final   A.calcoaceticus-baumannii NOT DETECTED NOT DETECTED Final   Bacteroides fragilis NOT DETECTED NOT DETECTED Final   Enterobacterales NOT DETECTED NOT DETECTED Final   Enterobacter cloacae complex NOT DETECTED NOT DETECTED Final   Escherichia coli NOT DETECTED NOT DETECTED Final   Klebsiella aerogenes NOT DETECTED NOT DETECTED Final   Klebsiella oxytoca NOT DETECTED NOT DETECTED Final   Klebsiella pneumoniae NOT DETECTED NOT DETECTED Final   Proteus species NOT DETECTED NOT DETECTED Final   Salmonella species NOT DETECTED NOT DETECTED Final   Serratia marcescens NOT DETECTED NOT DETECTED Final   Haemophilus influenzae NOT DETECTED NOT DETECTED Final   Neisseria meningitidis NOT DETECTED NOT DETECTED Final   Pseudomonas aeruginosa NOT DETECTED NOT DETECTED Final   Stenotrophomonas maltophilia NOT DETECTED NOT DETECTED Final   Candida albicans NOT DETECTED NOT DETECTED Final   Candida auris NOT DETECTED NOT DETECTED Final   Candida glabrata NOT DETECTED NOT  DETECTED Final   Candida krusei NOT DETECTED NOT DETECTED Final   Candida parapsilosis NOT DETECTED NOT DETECTED Final   Candida tropicalis NOT DETECTED NOT DETECTED Final   Cryptococcus neoformans/gattii NOT DETECTED NOT DETECTED Final    Comment: Performed  at [facility], [address], [place], [place] [address]  CULTURE, BLOOD (ROUTINE X 2) w Reflex to ID Panel     Status: None (Preliminary result)   Collection Time: 06/10/20 11:26 AM   Specimen: BLOOD  Result Value Ref Range Status   Specimen Description BLOOD LEFT ARM  Final   Special Requests   Final    BOTTLES DRAWN AEROBIC ONLY Blood Culture adequate volume   Culture   Final    NO GROWTH 4 DAYS Performed at [facility], [address], [place], [place] [address]    Report Status PENDING  Incomplete  CSF culture w Stat Gram Stain     Status: None   Collection Time: 06/10/20  2:19 PM   Specimen: CSF; Cerebrospinal Fluid  Result Value Ref Range Status   Specimen Description   Final    CSF Performed at [facility], [address] [place], [place] [address]    Special Requests   Final    NONE Performed at [facility], [address] [place], [place] [address]    Gram Stain   Final    GRAM POSITIVE DIPLOCOCCI CRITICAL RESULT CALLED TO, READ BACK BY AND VERIFIED WITH: KORY ANDREWS @1627  06/10/20 MJU WBC SEEN RBC SEEN Performed at [facility], [address] [place], [place] [address]    Culture MODERATE STREPTOCOCCUS PNEUMONIAE  Final   Report Status 06/12/2020 FINAL  Final   Organism ID, Bacteria STREPTOCOCCUS PNEUMONIAE  Final      Susceptibility   Streptococcus pneumoniae - MIC*    ERYTHROMYCIN >=8 RESISTANT Resistant     LEVOFLOXACIN 1 SENSITIVE Sensitive     VANCOMYCIN 0.25 SENSITIVE Sensitive     PENICILLIN (meningitis) <=0.06 SENSITIVE Sensitive     PENO - penicillin <=0.06      PENICILLIN (non-meningitis) <=0.06 SENSITIVE Sensitive     PENICILLIN (oral)  <=0.06 SENSITIVE Sensitive     CEFTRIAXONE (non-meningitis) <=0.12 SENSITIVE Sensitive     CEFTRIAXONE (meningitis) <=0.12 SENSITIVE Sensitive     * MODERATE STREPTOCOCCUS PNEUMONIAE  MRSA PCR Screening     Status: None   Collection Time: 06/11/20  4:56 AM   Specimen: Nasopharyngeal  Result Value Ref Range Status   MRSA by PCR NEGATIVE NEGATIVE Final    Comment:        The GeneXpert MRSA Assay (FDA approved for NASAL specimens only), is one component of a comprehensive MRSA colonization surveillance program. It is not intended to diagnose MRSA infection nor to guide or monitor treatment for MRSA infections. Performed at [facility], [address] [place], [place] [address]   Culture, blood (Routine X 2) w Reflex to ID Panel     Status: None (Preliminary result)   Collection Time: 06/12/20 12:41 AM   Specimen: BLOOD  Result Value Ref Range Status   Specimen Description BLOOD BLOOD RIGHT HAND  Final   Special Requests   Final    BOTTLES DRAWN AEROBIC AND ANAEROBIC Blood Culture adequate volume   Culture   Final    NO GROWTH 2 DAYS Performed at [facility], [address], [place],  [address]    Report Status PENDING  Incomplete  Culture, blood (Routine X 2) w Reflex to ID Panel     Status: None (Preliminary result)   Collection Time: 06/12/20 12:41 AM   Specimen: BLOOD  Result Value Ref Range Status   Specimen Description BLOOD BLOOD LEFT HAND  Final   Special  Requests IN PEDIATRIC BOTTLE Blood Culture adequate volume  Final   Culture   Final    NO GROWTH 2 DAYS Performed at [facility], [address] [place], [place] [address]    Report Status PENDING  Incomplete     Studies: [place] EKG SITE RITE  Result Date: 06/13/2020 If Site Rite image not attached, placement could not be confirmed due to current cardiac rhythm.  [place] EKG SITE RITE  Result Date: 06/13/2020 If Site Rite image not attached, placement could not be confirmed  due to current cardiac rhythm.   Scheduled Meds: . chlorhexidine  15 mL Mouth Rinse BID  . Chlorhexidine Gluconate Cloth  6 each Topical Q0600  . enoxaparin (LOVENOX) injection  40 mg Subcutaneous Q24H  . insulin aspart  0-20 Units Subcutaneous Q4H  . insulin glargine  15 Units Subcutaneous BID  . levothyroxine  150 mcg Oral Q0600  . mouth rinse  15 mL Mouth Rinse q12n4p  . pantoprazole (PROTONIX) IV  40 mg Intravenous Q24H  . sodium chloride flush  10-40 mL Intracatheter Q12H  . sodium chloride flush  3 mL Intravenous Q12H  . [START ON 06/19/2020] thiamine injection  100 mg Intravenous Daily   Continuous Infusions: . cefTRIAXone (ROCEPHIN)  IV 2 g (06/14/20 1631)  . dextrose 5% lactated ringers 75 mL/hr at 06/14/20 1631  . [START ON 06/16/2020] sodium chloride 0.9 % 25 mL with thiamine (B-1) 250 mg infusion     Followed by  . sodium chloride 0.9 % 50 mL with thiamine (B-1) 500 mg infusion Stopped (06/14/20 1516)    Assessment/Plan:  1. 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  Patient had acute metabolic encephalopathy and acute kidney injury.  Initial blood cultures and CFS cultures positive for pneumococcus.  Continue high-dose Rocephin.  Repeat blood cultures so far negative.  Patient has PICC line. 2. Paraspinal abscess, small epidural abscess, septic arthritis L2-L3 and L3-L4.  Neurosurgery and IR recommended serial imaging based on clinical exam.  Continue Rocephin. 3. Acute metabolic encephalopathy.  Every day the patient's mental status has improved a little bit.  This morning able to follow some more commands and was able to be put on dysphagia diet by speech therapy.  Nursing staff states the patient is off Precedex drip at this time. 4. DKA on presentation.  On Lantus twice daily with short acting insulin. 5. Acute kidney injury.  Creatinine 1.56 on 03/29/2021.  Today's creatinine 1.12. 6. Anemia of chronic disease.  Hemoglobin  8.2 7. Elevated liver function test secondary to sepsis 8. Hypothyroidism unspecified on levothyroxine 9. Obesity with a BMI of 33.22 10. History of CLL 11. Hypernatremia.Today sodium is 153.  Hopefully will get better as she starts eating more.        Code Status:     Code Status Orders  (From admission, onward)         Start     Ordered   06/09/20 2313  Full code  Continuous        06/09/20 2314        Code Status History    This patient has a current code status but no historical code status.   Advance Care Planning Activity     Family Communication: Spoke with aunt at the bedside Disposition Plan: Status is: Inpatient  Dispo: The patient is from: Home              Anticipated d/c is to: Home  Anticipated d/c date is: Likely will need another week in the hospital              Patient currently currently on IV Rocephin   Difficult to place patient.  Hopefully not  Antibiotics:  Rocephin  Time spent: 28 minutes, case discussed with neurology  Loletha Grayer  [facility]

## 2020-06-14 NOTE — Progress Notes (Signed)
Pt more alert as shift went on. Able to make short sentences this evening. Pain medication required frequently. Pt repositioned for pain as well. Tolerating diet. HR dipped to 30s at 1215 and precedex stopped. HR now NSR.

## 2020-06-14 NOTE — Progress Notes (Signed)
Clinical/Bedside Swallow Evaluation Patient Details  Name: Tonya Myers MRN: [id number] Date of Birth: 1965-11-20  Today's Date: 06/14/2020 Time: SLP Start Time (ACUTE ONLY): 1250 SLP Stop Time (ACUTE ONLY): 1320 SLP Time Calculation (min) (ACUTE ONLY): 30 min  Past Medical History:  Past Medical History:  Diagnosis Date   Acute hemorrhoid 03/11/2015   Anxiety    Arthritis    Asthma    Cancer ([place])    Chronic back pain    CLL (chronic lymphocytic leukemia) ([place])    Depression    Diabetes mellitus without complication ([place])    Genital herpes    type 2   Hypertension    Hypothyroidism    Vertigo    Past Surgical History:  Past Surgical History:  Procedure Laterality Date   ABDOMINAL HYSTERECTOMY     BILATERAL SALPINGOOPHORECTOMY  2009   BREAST BIOPSY Right 05/17/2016   FIBROADENOMATOUS CHANGE AND SCLEROSING ADENOSIS WITH   COLONOSCOPY WITH PROPOFOL N/A 06/16/2015   Procedure: COLONOSCOPY WITH PROPOFOL;  Surgeon: [name], MD;  [facility] ENDOSCOPY;  Service: Endoscopy;  Laterality: N/A;   LAPAROSCOPIC SUPRACERVICAL HYSTERECTOMY  2009   due to leio   OOPHORECTOMY     HPI:  Tonya Myers is a 55 y.o. female admitted with aphasia out of proportion to encephalopathy, fever and tachycardia x 2 days. 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   Assessment / Plan / Recommendation Clinical Impression   Pt seen for ST evaluation for dysphagia; per RN improvements in mentation with pt alert and able to follow commands this morning. Pt's aunt present for assessment. Patient stated name and birthday, allows and even assists in oral care, although fatigues easily. Removed thick white secretions from tongue, teeth, palate. She is requesting water. Consumed ice chips, thin water via spoon with appearance of good oral control, timely swallow initiation and adequate airway protection. Needs assistance to hold cup to lips, remains somewhat  confused and has difficulty retrieving bolus from cup, with cough 1/3 trials. With straw, pt able to follow commands for single sips, but did not exhibit overt signs of aspiration even with multiple sips. Pt requested aunt's assistance for feeding; consumed 4oz puree with good oral clearance and no overt signs of aspiration. Educated on aspiration precautions such as feeding only when alert, and when positioned upright, after oral care. Recommend initiating dysphagia 1, thin liquids, straws OK, meds crushed, with full supervision/assistance. SLP to follow for tolerance and advancement as mentation improves.      SLP Visit Diagnosis: Dysphagia, oropharyngeal phase (R13.12)    Aspiration Risk  Mild aspiration risk    Diet Recommendation Dysphagia 1 (Puree);Thin liquid   Liquid Administration via: Straw;Cup Medication Administration: Crushed with puree Supervision: Full supervision/cueing for compensatory strategies;Staff to assist with self feeding Compensations: Minimize environmental distractions;Slow rate;Small sips/bites Postural Changes: Seated upright at 90 degrees    Other  Recommendations Oral Care Recommendations: Oral care QID;Oral care prior to ice chip/H20   Follow up Recommendations None      Frequency and Duration min 2x/week  2 weeks       Prognosis Prognosis for Safe Diet Advancement: Good      Swallow Study   General Date of Onset: 06/09/20 HPI: Tonya Myers is a 55 y.o. female admitted with aphasia out of proportion to encephalopathy, fever and tachycardia x 2 days. 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 Type of Study: Bedside Swallow Evaluation Previous Swallow Assessment: none on file  Diet Prior to this Study: NPO Temperature Spikes Noted: No Respiratory Status: Room air History of Recent Intubation: No Behavior/Cognition: Alert;Cooperative;Confused Oral Cavity Assessment: Dry;Dried secretions Oral Care Completed by SLP:  Yes Oral Cavity - Dentition: Adequate natural dentition Vision: Functional for self-feeding Self-Feeding Abilities: Needs assist Patient Positioning: Upright in bed Baseline Vocal Quality: Normal Volitional Cough: Strong Volitional Swallow: Unable to elicit (too dry)    Oral/Motor/Sensory Function Overall Oral Motor/Sensory Function: Within functional limits   Ice Chips Ice chips: Within functional limits Presentation: Spoon   Thin Liquid Thin Liquid: Within functional limits Presentation: Spoon;Cup;Straw;Self Fed Pharyngeal  Phase Impairments: Other (comments) (cough x1 when assisted with cup; better awareness when retrieving from spoon or straw)    Nectar Thick Nectar Thick Liquid: Not tested   Honey Thick Honey Thick Liquid: Not tested   Puree Puree: Within functional limits Presentation: [place], [place], CCC-SLP Speech-Language Pathologist  Solid: Not tested      Aliene Altes 06/14/2020,1:24 PM

## 2020-06-15 DIAGNOSIS — G001 Pneumococcal meningitis: Secondary | ICD-10-CM | POA: Diagnosis not present

## 2020-06-15 DIAGNOSIS — M4620 Osteomyelitis of vertebra, site unspecified: Secondary | ICD-10-CM | POA: Diagnosis not present

## 2020-06-15 DIAGNOSIS — G062 Extradural and subdural abscess, unspecified: Secondary | ICD-10-CM | POA: Diagnosis not present

## 2020-06-15 DIAGNOSIS — G9340 Encephalopathy, unspecified: Secondary | ICD-10-CM | POA: Diagnosis not present

## 2020-06-15 DIAGNOSIS — A419 Sepsis, unspecified organism: Secondary | ICD-10-CM | POA: Diagnosis not present

## 2020-06-15 LAB — GLUCOSE, CAPILLARY
Glucose-Capillary: 294 mg/dL — ABNORMAL HIGH (ref 70–99)
Glucose-Capillary: 281 mg/dL — ABNORMAL HIGH (ref 70–99)
Glucose-Capillary: 188 mg/dL — ABNORMAL HIGH (ref 70–99)
Glucose-Capillary: 232 mg/dL — ABNORMAL HIGH (ref 70–99)
Glucose-Capillary: 256 mg/dL — ABNORMAL HIGH (ref 70–99)
Glucose-Capillary: 300 mg/dL — ABNORMAL HIGH (ref 70–99)

## 2020-06-15 LAB — CULTURE, BLOOD (ROUTINE X 2)
Culture: NO GROWTH
Special Requests: ADEQUATE

## 2020-06-15 LAB — MAGNESIUM: Magnesium: 1.6 mg/dL — ABNORMAL LOW (ref 1.7–2.4)

## 2020-06-15 LAB — SODIUM: Sodium: 152 mmol/L — ABNORMAL HIGH (ref 135–145)

## 2020-06-15 LAB — PHOSPHORUS: Phosphorus: 2.3 mg/dL — ABNORMAL LOW (ref 2.5–4.6)

## 2020-06-15 MED ORDER — MAGNESIUM SULFATE 2 GM/50ML IV SOLN
2.0000 g | Freq: Once | INTRAVENOUS | Status: AC
  Administered 2020-06-15: 2 g via INTRAVENOUS
  Filled 2020-06-15: qty 50

## 2020-06-15 MED ORDER — POTASSIUM & SODIUM PHOSPHATES 280-160-250 MG PO PACK
1.0000 | PACK | Freq: Three times a day (TID) | ORAL | Status: AC
  Administered 2020-06-15 (×2): 1 via ORAL
  Filled 2020-06-15 (×3): qty 1

## 2020-06-15 MED ORDER — DEXTROSE IN LACTATED RINGERS 5 % IV SOLN: INTRAVENOUS | Status: DC

## 2020-06-15 MED ORDER — INSULIN GLARGINE 100 UNIT/ML ~~LOC~~ SOLN
15.0000 [IU] | Freq: Every day | SUBCUTANEOUS | Status: DC
  Administered 2020-06-15 – 2020-06-16 (×2): 15 [IU] via SUBCUTANEOUS
  Filled 2020-06-15 (×2): qty 0.15

## 2020-06-15 MED ORDER — INSULIN ASPART 100 UNIT/ML ~~LOC~~ SOLN
0.0000 [IU] | Freq: Three times a day (TID) | SUBCUTANEOUS | Status: DC
  Administered 2020-06-15 – 2020-06-16 (×2): 5 [IU] via SUBCUTANEOUS
  Administered 2020-06-16: 9 [IU] via SUBCUTANEOUS
  Administered 2020-06-16 – 2020-06-17 (×2): 5 [IU] via SUBCUTANEOUS
  Administered 2020-06-17 – 2020-06-18 (×2): 3 [IU] via SUBCUTANEOUS
  Filled 2020-06-15 (×8): qty 1

## 2020-06-15 MED ORDER — TRAZODONE HCL 50 MG PO TABS
50.0000 mg | ORAL_TABLET | Freq: Every day | ORAL | Status: DC
  Administered 2020-06-15 – 2020-06-20 (×5): 50 mg via ORAL
  Filled 2020-06-15 (×6): qty 1

## 2020-06-15 MED ORDER — INSULIN ASPART 100 UNIT/ML ~~LOC~~ SOLN
0.0000 [IU] | Freq: Every day | SUBCUTANEOUS | Status: DC
  Administered 2020-06-15 – 2020-06-16 (×2): 3 [IU] via SUBCUTANEOUS
  Administered 2020-06-17: 2 [IU] via SUBCUTANEOUS
  Filled 2020-06-15 (×3): qty 1

## 2020-06-15 MED ORDER — LOSARTAN POTASSIUM 25 MG PO TABS
25.0000 mg | ORAL_TABLET | Freq: Every day | ORAL | Status: DC
  Administered 2020-06-15: 25 mg via ORAL
  Filled 2020-06-15: qty 1

## 2020-06-15 MED ORDER — GABAPENTIN 400 MG PO CAPS
400.0000 mg | ORAL_CAPSULE | Freq: Two times a day (BID) | ORAL | Status: DC
  Administered 2020-06-15 – 2020-06-16 (×3): 400 mg via ORAL
  Filled 2020-06-15: qty 4
  Filled 2020-06-15 (×3): qty 1
  Filled 2020-06-15: qty 4
  Filled 2020-06-15: qty 1

## 2020-06-15 NOTE — Progress Notes (Signed)
NEUROLOGY CONSULTATION PROGRESS NOTE   Date of service: June 15, 2020 Patient Name: Tonya Myers MRN:  [id number] DOB:  [birth year]  Brief HPI  Tonya Myers is a 55 y.o. female admitted with aphasia out of proportion to encephalopathy, fever and tachycardia x 2 days. 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 She has shown consistent improvement on Ceftriaxone and is now off Precedex. Wake, alert and engages in a conversation.   Interval Hx   She is up in the bed today. Feels weak but reports that this is getting better. Able to engage in a conversation. Knows why she is in the hospital and answers all questions.  Vitals   Vitals:   06/15/20 0800 06/15/20 0900 06/15/20 1000 06/15/20 1100  BP: (!) 175/75 (!) 172/87    Pulse:      Resp: 16 (!) 21 (!) 23 (!) 25  Temp: 99.1 F (37.3 C)     TempSrc: Axillary     SpO2: 98%     Weight:      Height:         Body mass index is 33.04 kg/m.  Physical Exam   General: Laying in bed; in no acute distress. HENT: Normal oropharynx and mucosa. Normal external appearance of ears and nose. Dry MM Neck: No gross stiffness but has pain and tenderness on moving head passively. CV: No JVD. No peripheral edema. Pulmonary: Symmetric Chest rise. Tachypneic. Abdomen: Soft to touch, non-tender. Ext: No cyanosis, edema, or deformity Skin: No rash. Normal palpation of skin. Musculoskeletal: Normal digits and nails by inspection. No clubbing.  Neurologic Examination  Mental status/Cognition: Alert, oriented to self, place, month and year, good attention. Speech/language: Fluent, comprehension intact, object naming intact, repetition intact. Cranial nerves:   CN II Pupils equal and reactive to light, no VF deficits   CN III,IV,VI EOM intact, no gaze preference or deviation, no nystagmus   CN V normal sensation in V1, V2, and V3 segments bilaterally   CN VII no asymmetry, no nasolabial fold flattening   CN VIII  normal hearing to speech   CN IX & X normal palatal elevation, no uvular deviation   CN XI 5/5 head turn and 5/5 shoulder shrug bilaterally   CN XII midline tongue protrusion   Motor:  Muscle bulk: normal, tone normal, tremulous movements. Mvmt Root Nerve  Muscle Right Left Comments  SA C5/6 Ax Deltoid     EF C5/6 Mc Biceps 4+ 4+   EE C6/7/8 Rad Triceps 4+ 4+   WF C6/7 Med FCR     WE C7/8 PIN ECU     F Ab C8/T1 U ADM/FDI 4+ 4+   HF L1/2/3 Fem Illopsoas 4 4   KE L2/3/4 Fem Quad     DF L4/5 D Peron Tib Ant 5 5   PF S1/2 Tibial Grc/Sol 5 5    Reflexes:  Right Left Comments  Pectoralis      Biceps (C5/6) 2 2   Brachioradialis (C5/6) 2 2    Triceps (C6/7) 2 2    Patellar (L3/4) 1 1    Achilles (S1) 2 2    Hoffman neg neg    Plantar withdraws withdraws   Jaw jerk    Sensation:  Light touch Intact throughout   Pin prick    Temperature    Vibration   Proprioception    Coordination/Complex Motor:  - Finger to Nose with ataxia BL - Heel to shin with ataxia BL - Rapid alternating  movement are slowed and incoordinated. - Gait: Deferred.  Labs   Basic Metabolic Panel:  Lab Results  Component Value Date   NA 152 (H) 06/15/2020   K 4.0 06/14/2020   CO2 23 06/14/2020   GLUCOSE 303 (H) 06/14/2020   BUN 41 (H) 06/14/2020   CREATININE 1.12 (H) 06/14/2020   CALCIUM 8.6 (L) 06/14/2020   GFRNONAA 58 (L) 06/14/2020   GFRAA >60 01/17/2020   HbA1c:  Lab Results  Component Value Date   HGBA1C 8.0 (H) 06/09/2020   Imaging and Diagnostic studies  CSF Studies: Turbid CSF WBC: 63,442. RBCs: 20,290 CSF Glucose: < 20 CSF Protein: > 600  MR BRAIN WO CONTRAST  Normal brain MRI.   MRI C, T, L spine w/o contrast:  IMPRESSION: 1. MR findings consistent with septic arthritis involving the left L2-3 and L3-4 facet joints with associated abscess is in the left paraspinal muscles. 2. Suspect small epidural abscess posteriorly at L2-3 on the left side. 3. No definite MR  findings for discitis or osteomyelitis in the cervical, thoracic or lumbar spines. 4. Degenerative cervical spondylosis with multilevel disc disease and facet disease. Multilevel disc protrusions most significant at C3-4.  No cord compression seen on C, T or L-spine.  Repeat CTH - normal - no evidence of hydrocephalus  Routine EEG completed last night: IMPRESSION: This study is suggestive of moderate diffuse encephalopathy, nonspecific etiology. No seizures or epileptiform discharges were seen throughout the recording.   Impression   Tonya Myers is a 55 y.o. female admitted with Pneumococcal Meningitis, paraspinal abscesses and Pneumococcal Sepsis. Neuro exam with significant improvement in encephalopathy. Still reports difficulty concentrating but able to engage in a conversation and follow commands. MR Brain with no acute abnormality. MR whole spine c/w septic arthritis of lumbar spine with associated paraspinal muscle abscess, albeit small. Repeat head imaging not suggestive of hydrocephalus of increased ICP.  Overall picture consistent with meningitis and septic arthritis/paraspinal muscle abscess of the lumbar spine. Not floridly myelopathic on exam and no imaging evidence of cord compression.  No evidence of seizures on EEG  Recommendations  -Mainstay of treatment will be abx - ID on board. Duration TBD by ID in light of the spine findings. -Stat CTH if mentation worsens or is more somnolent or has CN abnormalities pop up on exam -No need for AEDs -Neurology inpatient team will signoff. Please feel free to contact us with any questions or concerns. _______________________________________________________________  Donnetta Simpers [facility] Pager Number [phone]

## 2020-06-15 NOTE — Progress Notes (Signed)
Report called to [facility] on 1C.

## 2020-06-15 NOTE — Progress Notes (Signed)
Inpatient Rehab Admissions Coordinator Note:   Per therapy recommendations, pt was screened for [name] candidacy by Clemens Catholic, [place] CCC-SLP. At this time, Pt. Is only attempting only bed level exercises, so I have concern she may not be able to tolerate [name] level therapies. If Pt. Progresses beyond bed-level activities, she will likely be a good candidate for [name]. I will follow and monitor for progress and place consult order if Pt. Appears to be an appropriate candidate.    Clemens Catholic, [place], [place] Admissions Coordinator  [phone] ([place]) [phone] (office)

## 2020-06-15 NOTE — Evaluation (Signed)
Occupational Therapy Evaluation Patient Details Name: Tonya Myers MRN: [id number] DOB: Oct 24, 1965 Today's Date: 06/15/2020    History of Present Illness 55 y/o F admitted from home on 06/09/20 after presenting with AMS. Pt being treated for acute encephalopathy. 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 Pt also found to have paraspinal abscess, small epidural abscess, septic arthritis L2-L3 and L3-L4 with Neurosurgery & IR recommended serial imaging. PMH: hypothyroidism, DDD, cervical radiculopathy, CLL, DM, HTN, HLD, anxiety, arthritis, CA, chronic back pain, asthma, depression, vertigo   Clinical Impression   Pt seen for OT evaluation this date. Upon arrival to room, pt awake and seated in bed via chair-position. Pt was agreeable to OT evaluation and to participating in functional mobility. Prior to admission, pt was independent with ADLs/IADLs and functional mobility, living in a 2-story home with her significant other. Pt is looking forward to returning home, and resuming her hobbies (i.e., fishing and caring for her 4 dogs). Pt presents with decreased strength, balance, and activity tolerance. This date, pt was able to perform bed-level grooming with SET-UP assistance, requiring increase time/effort in setting of decreased strength. Pt was motivated to sit EOB, and with HOB elevated and MIN A for trunk support, pt was able to perform supine>sit transfer. Pt was able to sit EOB for ~20 sec with intermittent MOD A for static sitting balance. Pt would benefit from additional skilled OT services to improve strength, balance, and activity tolerance, and maximize return to PLOF. Given pt's motivation and high level of functioning prior to admission, anticipate pt able to tolerate higher frequency of OT following continued medical management. Upon discharge, recommend [name] services.      Follow Up Recommendations  [name]    Equipment Recommendations  Other (comment) (defer to next  venue of care)       Precautions / Restrictions Precautions Precautions: Fall Restrictions Weight Bearing Restrictions: No      Mobility Bed Mobility Overal bed mobility: Needs Assistance Bed Mobility: Supine to Sit     Supine to sit: Min assist     General bed mobility comments: With HOB elevated, pt able to bring b/l LE off bed. Required MIN A for elevating trunk    Transfers                 General transfer comment: Unsafe to perform OOB mobility this session d/t poor static sitting balance    Balance Overall balance assessment: Needs assistance Sitting-balance support: No upper extremity supported;Feet supported Sitting balance-Leahy Scale: Poor Sitting balance - Comments: Pt able to sit EOB for ~20 sec with intermittent MOD A in setting of right and left lateral lean                                   ADL either performed or assessed with clinical judgement   ADL Overall ADL's : Needs assistance/impaired     Grooming: Wash/dry face;Set up;Bed level Grooming Details (indicate cue type and reason): Following set-up assistance, pt able to bring washcloth to face with increased time and effort             Lower Body Dressing: Maximal assistance;Bed level Lower Body Dressing Details (indicate cue type and reason): to don socks  Pertinent Vitals/Pain Pain Assessment: 0-10 Pain Score: 6  Pain Location: Neck Pain Intervention(s): Limited activity within patient's tolerance;Monitored during session;Repositioned        Extremity/Trunk Assessment Upper Extremity Assessment Upper Extremity Assessment: Generalized weakness (grossly 3/5 for should flexion and elbow extension)   Lower Extremity Assessment Lower Extremity Assessment: Defer to PT evaluation       Communication Communication Communication: No difficulties   Cognition Arousal/Alertness: Awake/alert Behavior During Therapy: WFL  for tasks assessed/performed Overall Cognitive Status: Impaired/Different from baseline                                 General Comments: Pt oriented to self and situation. Required increased processing time and MIN cues for re-direction during PLOF questions   General Comments               Home Living Family/patient expects to be discharged to:: Private residence Living Arrangements: Spouse/significant other Available Help at Discharge: Family;Available 24 hours/day;Other (Comment) (Pt's significant other is home 24/7. Daughter is visiting from [place]) Type of Home: [place]: Two level;Able to live on main level with bedroom/bathroom Alternate Level Stairs-Number of Steps: 16 Alternate Level Stairs-Rails:  (Pt unable to recall) Bathroom Shower/Tub: Occupational psychologist: Standard     Home Equipment: Shower seat;Grab bars - toilet          Prior Functioning/Environment Level of Independence: Independent        Comments: Prior to admission, pt was independent with ADLS/IADLs and functional mobility. D/t chronic back pain, pt retired from her work as a Recruitment consultant. Pt enjoys fishing and taking care of her 4 dogs. Pt's significant other cooks for her.        OT Problem List: Decreased strength;Decreased activity tolerance;Impaired balance (sitting and/or standing);Decreased safety awareness;Decreased knowledge of precautions      OT Treatment/Interventions: Self-care/ADL training;Therapeutic exercise;Energy conservation;DME and/or AE instruction;Therapeutic activities;Patient/family education;Balance training    OT Goals(Current goals can be found in the care plan section) Acute Rehab OT Goals Patient Stated Goal: to go home and take care of dogs OT Goal Formulation: With patient Time For Goal Achievement: 06/29/20 Potential to Achieve Goals: Fair ADL Goals Pt Will Perform Grooming: with min assist;standing Pt Will Transfer to  Toilet: with min guard assist;ambulating;bedside commode Pt Will Perform Toileting - Clothing Manipulation and hygiene: with min guard assist;sit to/from stand  OT Frequency: Min 3X/week    AM-PAC OT "6 Clicks" Daily Activity     Outcome Measure Help from another person eating meals?: A Little Help from another person taking care of personal grooming?: A Lot Help from another person toileting, which includes using toliet, bedpan, or urinal?: Total Help from another person bathing (including washing, rinsing, drying)?: A Lot Help from another person to put on and taking off regular upper body clothing?: A Lot Help from another person to put on and taking off regular lower body clothing?: A Lot 6 Click Score: 12   End of Session Nurse Communication: Mobility status  Activity Tolerance: Patient tolerated treatment well Patient left: in bed;with call bell/phone within reach;with bed alarm set  OT Visit Diagnosis: Unsteadiness on feet (R26.81);Muscle weakness (generalized) (M62.81)                Time: 9702-6378 OT Time Calculation (min): 33 min Charges:  OT General Charges $OT Visit: 1 Visit OT Evaluation $OT  Eval Moderate Complexity: 1 Mod OT Treatments $Therapeutic Activity: 8-22 mins  Fredirick Maudlin, OTR/L [place]

## 2020-06-15 NOTE — Progress Notes (Signed)
Patient ID: Tonya Myers, female   DOB: [birth year], 55 y.o.   MRN: [id number] [facility] PROGRESS NOTE  Tonya Myers YIF:[id number] DOB: 10-07-1965 DOA: 06/09/2020 PCP: Tonya Pink, MD  HPI/Subjective: Today the patient was talking better than previously.  She verbalized that she wanted to go home.  She states that she does have a little neck discomfort.  She told me that she takes gabapentin at home but does not take any pain medications.  Placed on a diet yesterday.  Admitted with severe sepsis with pneumococcus and also has pneumococcal meningitis.  Objective: Vitals:   06/15/20 1000 06/15/20 1100  BP:    Pulse:    Resp: (!) 23 (!) 25  Temp:    SpO2:      Intake/Output Summary (Last 24 hours) at 06/15/2020 1243 Last data filed at 06/15/2020 1200 Gross per 24 hour  Intake 3732.69 ml  Output 3240 ml  Net 492.69 ml   Filed Weights   06/13/20 0442 06/14/20 0447 06/15/20 0420  Weight: 111.1 kg 111.1 kg 110.5 kg    ROS: Review of Systems  Respiratory: Negative for shortness of breath.   Cardiovascular: Negative for chest pain.  Gastrointestinal: Negative for abdominal pain, nausea and vomiting.   Exam: Physical Exam HENT:     Head: Normocephalic.     Mouth/Throat:     Pharynx: No oropharyngeal exudate.  Eyes:     General: Lids are normal.     Conjunctiva/sclera: Conjunctivae normal.     Pupils: Pupils are equal, round, and reactive to light.  Cardiovascular:     Rate and Rhythm: Normal rate and regular rhythm.     Heart sounds: Normal heart sounds, S1 normal and S2 normal.  Pulmonary:     Breath sounds: Normal breath sounds. No decreased breath sounds, wheezing, rhonchi or rales.  Abdominal:     Palpations: Abdomen is soft.     Tenderness: There is no abdominal tenderness.  Musculoskeletal:     Right lower leg: No swelling.     Left lower leg: No swelling.  Skin:    General: Skin is warm.     Findings: No rash.  Neurological:     Mental Status: She  is alert.     Comments: Able to verbalize better today and previously.  Asking more questions.       Data Reviewed: Basic Metabolic Panel: Recent Labs  Lab 06/09/20 1157 06/09/20 2037 06/10/20 0600 06/11/20 0316 06/12/20 1027 06/13/20 0708 06/14/20 0446 06/15/20 0425  NA 137   < > 139 145 149* 151* 153* 152*  K 4.1   < > 3.7 3.7 4.5 4.6 4.0  --   CL 104   < > 106 113* 117* 119* 122*  --   CO2 17*   < > 21* 21* 20* 18* 23  --   GLUCOSE 274*   < > 191* 308* 199* 174* 303*  --   BUN 20   < > 24* 20 39* 44* 41*  --   CREATININE 1.35*   < > 1.17* 1.01* 1.13* 1.21* 1.12*  --   CALCIUM 9.3   < > 9.1 8.9 9.0 8.8* 8.6*  --   MG 1.8  --   --   --   --  2.4 2.9* 1.6*  PHOS 3.8  --   --   --   --  5.5* 3.7 2.3*   < > = values in this interval not displayed.   Liver Function Tests: Recent Labs  Lab 06/09/20 1157 06/09/20 2037 06/10/20 0600 06/12/20 1027  AST 50* 44* 39 29  ALT 36 31 27 14   ALKPHOS 200* 211* 204* 133*  BILITOT 2.1* 2.2* 2.4* 1.4*  PROT 7.3 7.6 7.2 5.9*  ALBUMIN 2.8* 2.8* 2.4* 1.7*   Recent Labs  Lab 06/09/20 1157  LIPASE 29   Recent Labs  Lab 06/09/20 2145  AMMONIA 14   CBC: Recent Labs  Lab 06/10/20 0600 06/11/20 0316 06/12/20 1027 06/13/20 1702 06/14/20 0446  WBC 12.2* 12.5* 17.0* 11.4* 12.5*  NEUTROABS  --   --   --  8.2*  --   HGB 12.3 10.8* 8.6* 7.1* 8.2*  HCT 37.1 31.9* 26.3* 21.7* 25.4*  MCV 78.1* 77.1* 77.6* 80.4 80.6  PLT 286 272 232 181 230    CBG: Recent Labs  Lab 06/14/20 2308 06/15/20 0102 06/15/20 0315 06/15/20 0755 06/15/20 1128  GLUCAP 303* 294* 281* 188* 232*    Recent Results (from the past 240 hour(s))  Resp Panel by RT-PCR (Flu A&B, Covid) Nasopharyngeal Swab     Status: None   Collection Time: 06/09/20  9:45 PM   Specimen: Nasopharyngeal Swab; Nasopharyngeal(NP) swabs in vial transport medium  Result Value Ref Range Status   SARS Coronavirus 2 by RT PCR NEGATIVE NEGATIVE Final    Comment:  (NOTE) SARS-CoV-2 target nucleic acids are NOT DETECTED.  The SARS-CoV-2 RNA is generally detectable in upper respiratory specimens during the acute phase of infection. The lowest concentration of SARS-CoV-2 viral copies this assay can detect is 138 copies/mL. A negative result does not preclude SARS-Cov-2 infection and should not be used as the sole basis for treatment or other patient management decisions. A negative result may occur with  improper specimen collection/handling, submission of specimen other than nasopharyngeal swab, presence of viral mutation(s) within the areas targeted by this assay, and inadequate number of viral copies(<138 copies/mL). A negative result must be combined with clinical observations, patient history, and epidemiological information. The expected result is Negative.  Fact Sheet for Patients:  EntrepreneurPulse.com.au  Fact Sheet for Healthcare Providers:  IncredibleEmployment.be  This test is no t yet approved or cleared by the [place] FDA and  has been authorized for detection and/or diagnosis of SARS-CoV-2 by FDA under an Emergency Use Authorization (EUA). This EUA will remain  in effect (meaning this test can be used) for the duration of the COVID-19 declaration under Section 564(b)(1) of the Act, 21 U.S.C.section 360bbb-3(b)(1), unless the authorization is terminated  or revoked sooner.       Influenza A by PCR NEGATIVE NEGATIVE Final   Influenza B by PCR NEGATIVE NEGATIVE Final    Comment: (NOTE) The Xpert Xpress SARS-CoV-2/FLU/RSV plus assay is intended as an aid in the diagnosis of influenza from Nasopharyngeal swab specimens and should not be used as a sole basis for treatment. Nasal washings and aspirates are unacceptable for Xpert Xpress SARS-CoV-2/FLU/RSV testing.  Fact Sheet for Patients: EntrepreneurPulse.com.au  Fact Sheet for Healthcare  Providers: IncredibleEmployment.be  This test is not yet approved or cleared by the [place] FDA and has been authorized for detection and/or diagnosis of SARS-CoV-2 by FDA under an Emergency Use Authorization (EUA). This EUA will remain in effect (meaning this test can be used) for the duration of the COVID-19 declaration under Section 564(b)(1) of the Act, 21 U.S.C. section 360bbb-3(b)(1), unless the authorization is terminated or revoked.  Performed at [facility], [address], [place], [place] [address]   CULTURE, BLOOD (ROUTINE X  2) w Reflex to ID Panel     Status: Abnormal   Collection Time: 06/10/20  9:40 AM   Specimen: BLOOD  Result Value Ref Range Status   Specimen Description   Final    BLOOD BLOOD LEFT FOREARM Performed at [facility], [address] [place], [place] [address]    Special Requests   Final    BOTTLES DRAWN AEROBIC AND ANAEROBIC Blood Culture adequate volume Performed at [facility], [address] [place], [place] [address]    Culture  Setup Time   Final    Organism ID to follow IN BOTH AEROBIC AND ANAEROBIC BOTTLES GRAM POSITIVE COCCI CRITICAL RESULT CALLED TO, READ BACK BY AND VERIFIED WITH: SUSAN WATSON @1941  ON 06/10/20 SKL Performed at [facility], [address] [place], [place] [address]    Culture STREPTOCOCCUS PNEUMONIAE (A)  Final   Report Status 06/13/2020 FINAL  Final   Organism ID, Bacteria STREPTOCOCCUS PNEUMONIAE  Final      Susceptibility   Streptococcus pneumoniae - MIC*    ERYTHROMYCIN >=8 RESISTANT Resistant     LEVOFLOXACIN 0.5 SENSITIVE Sensitive     VANCOMYCIN 0.5 SENSITIVE Sensitive     PENICILLIN (meningitis) <=0.06 SENSITIVE Sensitive     PENO - penicillin <=0.06      PENICILLIN (non-meningitis) <=0.06 SENSITIVE Sensitive     PENICILLIN (oral) <=0.06 SENSITIVE Sensitive     CEFTRIAXONE (non-meningitis) <=0.12 SENSITIVE Sensitive     CEFTRIAXONE  (meningitis) <=0.12 SENSITIVE Sensitive     * STREPTOCOCCUS PNEUMONIAE  Blood Culture ID Panel (Reflexed)     Status: Abnormal   Collection Time: 06/10/20  9:40 AM  Result Value Ref Range Status   Enterococcus faecalis NOT DETECTED NOT DETECTED Final   Enterococcus Faecium NOT DETECTED NOT DETECTED Final   Listeria monocytogenes NOT DETECTED NOT DETECTED Final   Staphylococcus species NOT DETECTED NOT DETECTED Final   Staphylococcus aureus (BCID) NOT DETECTED NOT DETECTED Final   Staphylococcus epidermidis NOT DETECTED NOT DETECTED Final   Staphylococcus lugdunensis NOT DETECTED NOT DETECTED Final   Streptococcus species DETECTED (A) NOT DETECTED Final    Comment: CRITICAL RESULT CALLED TO, READ BACK BY AND VERIFIED WITH: SUSAN WATSON @1941  ON 06/10/20 SKL    Streptococcus agalactiae NOT DETECTED NOT DETECTED Final   Streptococcus pneumoniae DETECTED (A) NOT DETECTED Final    Comment: CRITICAL RESULT CALLED TO, READ BACK BY AND VERIFIED WITH: SUSAN WATSON @1941  ON 06/10/20 SKL    Streptococcus pyogenes NOT DETECTED NOT DETECTED Final   A.calcoaceticus-baumannii NOT DETECTED NOT DETECTED Final   Bacteroides fragilis NOT DETECTED NOT DETECTED Final   Enterobacterales NOT DETECTED NOT DETECTED Final   Enterobacter cloacae complex NOT DETECTED NOT DETECTED Final   Escherichia coli NOT DETECTED NOT DETECTED Final   Klebsiella aerogenes NOT DETECTED NOT DETECTED Final   Klebsiella oxytoca NOT DETECTED NOT DETECTED Final   Klebsiella pneumoniae NOT DETECTED NOT DETECTED Final   Proteus species NOT DETECTED NOT DETECTED Final   Salmonella species NOT DETECTED NOT DETECTED Final   Serratia marcescens NOT DETECTED NOT DETECTED Final   Haemophilus influenzae NOT DETECTED NOT DETECTED Final   Neisseria meningitidis NOT DETECTED NOT DETECTED Final   Pseudomonas aeruginosa NOT DETECTED NOT DETECTED Final   Stenotrophomonas maltophilia NOT DETECTED NOT DETECTED Final   Candida albicans NOT  DETECTED NOT DETECTED Final   Candida auris NOT DETECTED NOT DETECTED Final   Candida glabrata NOT DETECTED NOT DETECTED Final   Candida krusei NOT  DETECTED NOT DETECTED Final   Candida parapsilosis NOT DETECTED NOT DETECTED Final   Candida tropicalis NOT DETECTED NOT DETECTED Final   Cryptococcus neoformans/gattii NOT DETECTED NOT DETECTED Final    Comment: Performed at [facility], [address] [place], [place] [address]  CULTURE, BLOOD (ROUTINE X 2) w Reflex to ID Panel     Status: None   Collection Time: 06/10/20 11:26 AM   Specimen: BLOOD  Result Value Ref Range Status   Specimen Description BLOOD LEFT ARM  Final   Special Requests   Final    BOTTLES DRAWN AEROBIC ONLY Blood Culture adequate volume   Culture   Final    NO GROWTH 5 DAYS Performed at [facility], [address] [place], [place] [address]    Report Status 06/15/2020 FINAL  Final  CSF culture w Stat Gram Stain     Status: None   Collection Time: 06/10/20  2:19 PM   Specimen: CSF; Cerebrospinal Fluid  Result Value Ref Range Status   Specimen Description   Final    CSF Performed at [facility], [address], [place], [place] [address]    Special Requests   Final    NONE Performed at [facility], [address] [place], [place] [address]    Gram Stain   Final    GRAM POSITIVE DIPLOCOCCI CRITICAL RESULT CALLED TO, READ BACK BY AND VERIFIED WITH: KORY ANDREWS @1627  06/10/20 MJU WBC SEEN RBC SEEN Performed at [facility], [address] [place],  [address]    Culture MODERATE STREPTOCOCCUS PNEUMONIAE  Final   Report Status 06/12/2020 FINAL  Final   Organism ID, Bacteria STREPTOCOCCUS PNEUMONIAE  Final      Susceptibility   Streptococcus pneumoniae - MIC*    ERYTHROMYCIN >=8 RESISTANT Resistant     LEVOFLOXACIN 1 SENSITIVE Sensitive     VANCOMYCIN 0.25 SENSITIVE Sensitive     PENICILLIN (meningitis) <=0.06 SENSITIVE Sensitive     PENO -  penicillin <=0.06      PENICILLIN (non-meningitis) <=0.06 SENSITIVE Sensitive     PENICILLIN (oral) <=0.06 SENSITIVE Sensitive     CEFTRIAXONE (non-meningitis) <=0.12 SENSITIVE Sensitive     CEFTRIAXONE (meningitis) <=0.12 SENSITIVE Sensitive     * MODERATE STREPTOCOCCUS PNEUMONIAE  MRSA PCR Screening     Status: None   Collection Time: 06/11/20  4:56 AM   Specimen: Nasopharyngeal  Result Value Ref Range Status   MRSA by PCR NEGATIVE NEGATIVE Final    Comment:        The GeneXpert MRSA Assay (FDA approved for NASAL specimens only), is one component of a comprehensive MRSA colonization surveillance program. It is not intended to diagnose MRSA infection nor to guide or monitor treatment for MRSA infections. Performed at [facility], [address] [place],  [address]   Culture, blood (Routine X 2) w Reflex to ID Panel     Status: None (Preliminary result)   Collection Time: 06/12/20 12:41 AM   Specimen: BLOOD  Result Value Ref Range Status   Specimen Description BLOOD BLOOD RIGHT HAND  Final   Special Requests   Final    BOTTLES DRAWN AEROBIC AND ANAEROBIC Blood Culture adequate volume   Culture   Final    NO GROWTH 3 DAYS Performed at [facility], [address], [place],  [address]    Report Status PENDING  Incomplete  Culture, blood (Routine X 2) w Reflex to ID Panel  Status: None (Preliminary result)   Collection Time: 06/12/20 12:41 AM   Specimen: BLOOD  Result Value Ref Range Status   Specimen Description BLOOD BLOOD LEFT HAND  Final   Special Requests IN PEDIATRIC BOTTLE Blood Culture adequate volume  Final   Culture   Final    NO GROWTH 3 DAYS Performed at [facility], [address] [place], [place] [address]    Report Status PENDING  Incomplete     Scheduled Meds: . chlorhexidine  15 mL Mouth Rinse BID  . Chlorhexidine Gluconate Cloth  6 each Topical Q0600  . enoxaparin (LOVENOX) injection  40 mg  Subcutaneous Q24H  . gabapentin  400 mg Oral BID  . insulin aspart  0-5 Units Subcutaneous QHS  . insulin aspart  0-9 Units Subcutaneous TID WC  . insulin glargine  15 Units Subcutaneous Daily  . levothyroxine  150 mcg Oral Q0600  . losartan  25 mg Oral Daily  . mouth rinse  15 mL Mouth Rinse q12n4p  . pantoprazole (PROTONIX) IV  40 mg Intravenous Q24H  . potassium & sodium phosphates  1 packet Oral TID WC & HS  . sodium chloride flush  10-40 mL Intracatheter Q12H  . sodium chloride flush  3 mL Intravenous Q12H  . [START ON 06/19/2020] thiamine injection  100 mg Intravenous Daily  . traZODone  50 mg Oral QHS   Continuous Infusions: . cefTRIAXone (ROCEPHIN)  IV Stopped (06/15/20 0443)  . [START ON 06/16/2020] sodium chloride 0.9 % 25 mL with thiamine (B-1) 250 mg infusion     Followed by  . sodium chloride 0.9 % 50 mL with thiamine (B-1) 500 mg infusion 50 mL/hr at 06/15/20 0538    Assessment/Plan:  1. Severe pneumococcal sepsis, pneumococcal meningitis, present on admission.  Also had acute metabolic encephalopathy and acute kidney injury.  Initial blood cultures and CFS cultures positive for pneumococcus.  Continue high-dose Rocephin.  Repeat blood cultures so far negative.  Has PICC line. 2. Paraspinal abscess, small epidural abscess and septic arthritis L2-L3 and L3-L4.  Continue Rocephin.  Likely will need 6 weeks of IV antibiotics. 3. Acute metabolic encephalopathy.  Every day some slow improvement.  Today was able to verbalize that she wanted to go home.  She was able to verbalize that she takes gabapentin at home.  Patient was able to be put on dysphagia diet yesterday. 4. DKA on presentation.  And discontinuing IV fluids will change Lantus back to once a day with short acting insulin. 5. Acute kidney injury on presentation.  Creatinine 1.56 on 03/29/2021.  Yesterday's creatinine 1.12.  Recheck again tomorrow. 6. Anemia of chronic disease.  Last hemoglobin 8.2.  Recheck again  tomorrow. 7. Elevated liver function test secondary to sepsis 8. Hypothyroidism unspecified on levothyroxine 9. Obesity with a BMI of 33.04 10. History of CLL 11. Hypernatremia.  Today sodium 152.  Patient on diet hopefully can hydrate well enough to get sodium better if not we will have to restart fluids. 12. Hypomagnesemia replace magnesium IV today on magnesium level 1.6 13. Hypophosphatemia phosphorus 2.3 will replace phosphorus today     Code Status:     Code Status Orders  (From admission, onward)         Start     Ordered   06/09/20 2313  Full code  Continuous        06/09/20 2314        Code Status History    This patient has a current  code status but no historical code status.   Advance Care Planning Activity     Family Communication: Spoke with [name] on the phone Disposition Plan: Status is: Inpatient  Dispo: The patient is from: Home              Anticipated d/c is to: Likely will need rehab              Anticipated d/c date is: May need another 3 or 4 days here in the hospital              Patient currently on IV Rocephin for severe pneumococcal sepsis and meningitis.   Difficult to place patient.  Hopefully not.  Time spent: 27 minutes, case discussed with nursing staff.    [facility] [employer]

## 2020-06-15 NOTE — Evaluation (Signed)
Physical Therapy Evaluation Patient Details Name: Tonya Myers MRN: [id number] DOB: 06/02/1965 Today's Date: 06/15/2020   History of Present Illness  Pt is a 55 y/o female admitted from home on 06/09/20 after presenting with AMS. Pt being treated for acute encephalopathy. 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 Pt also found to have paraspinal abscess, small epidural abscess, septic arthritis L2-L3 and L3-L4 with Neurosurgery & IR recommended serial imaging. PMH: hypothyroidism, DDD, cervical radiculopathy, CLL, DM, HTN, HLD, anxiety, arthritis, CA, chronic back pain, asthma, depression, vertigo.    Clinical Impression  Pt was pleasant and motivated to participate during the session but ultimately presented with significant functional weakness.  Pt put forth very good effort with below therex with min extra time and cuing for proper technique.  Pt presented with significant BLE weakness however and was unable to perform an independent SLR on either LE. Pt required min-mod assist for BLE and trunk control with bed mobility tasks and presented with poor static sitting balance at the EOB.  Pt required frequent min to mod A to maintain neutral sitting position at the EOB with transfer attempt deferred for pt safety.  Based on patient's age, prior level of function, and motivation pt is expected to make good progress towards goals.  That being said pt would not be safe to return to her prior living situation at this time secondary to significant deficits in functional mobility and high fall risk.  Pt will benefit from PT services in an IR setting upon discharge to safely address deficits listed in patient problem list for decreased caregiver assistance and eventual return to PLOF.      Follow Up Recommendations [name]    Equipment Recommendations  Rolling walker with 5" wheels;3in1 (PT)    Recommendations for Other Services       Precautions / Restrictions  Precautions Precautions: Fall Restrictions Weight Bearing Restrictions: No      Mobility  Bed Mobility Overal bed mobility: Needs Assistance Bed Mobility: Supine to Sit;Sit to Supine     Supine to sit: Min assist;Mod assist;HOB elevated Sit to supine: Min assist;Mod assist   General bed mobility comments: Assist for BLE and trunk control    Transfers                 General transfer comment: Unable/unsafe to attempt secondary to poor static sitting balance  Ambulation/Gait                Stairs            Wheelchair Mobility    Modified Rankin (Stroke Patients Only)       Balance Overall balance assessment: Needs assistance Sitting-balance support: Feet supported;Bilateral upper extremity supported Sitting balance-Leahy Scale: Poor Sitting balance - Comments: Pt required min to mod A for static sitting stability with LOB in random directions                                     Pertinent Vitals/Pain Pain Assessment: 0-10 Pain Score: 7  Pain Location: Neck Pain Descriptors / Indicators: Sore Pain Intervention(s): Premedicated before session;Monitored during session    Home Living Family/patient expects to be discharged to:: Private residence Living Arrangements: Spouse/significant other Available Help at Discharge: Family;Available 24 hours/day Type of Home: House Home Access: Level entry     Home Layout: Two level;Able to live on main level with bedroom/bathroom Home Equipment: Shower seat;Grab  bars - toilet      Prior Function Level of Independence: Independent         Comments: Ind amb community distances without an AD, Ind with ADLs, two falls in the last year both from falling out of a poorly designed office chair that tipped when she would lean forward     Hand Dominance        Extremity/Trunk Assessment   Upper Extremity Assessment Upper Extremity Assessment: Generalized weakness    Lower Extremity  Assessment Lower Extremity Assessment: Generalized weakness       Communication   Communication: No difficulties  Cognition Arousal/Alertness: Awake/alert Behavior During Therapy: WFL for tasks assessed/performed Overall Cognitive Status: Impaired/Different from baseline                                 General Comments: Pt oriented to self and situation. Required increased processing time and cues for commands and with history      General Comments      Exercises Total Joint Exercises Ankle Circles/Pumps: Strengthening;Both;5 reps;10 reps (with resistance) Quad Sets: Strengthening;Both;5 reps;10 reps Gluteal Sets: Strengthening;Both;5 reps;10 reps Heel Slides: AAROM;Both;10 reps;Strengthening Hip ABduction/ADduction: AAROM;Strengthening;Both;10 reps Straight Leg Raises: AAROM;Strengthening;Both;5 reps Other Exercises Other Exercises: HEP education with pt and partner for BLE APs, QS, and GS every 1-2 hours daily   Assessment/Plan    PT Assessment Patient needs continued PT services  PT Problem List Decreased strength;Decreased activity tolerance;Decreased balance;Decreased mobility;Decreased coordination;Decreased knowledge of use of DME       PT Treatment Interventions DME instruction;Gait training;Functional mobility training;Therapeutic activities;Therapeutic exercise;Balance training;Patient/family education    PT Goals (Current goals can be found in the Care Plan section)  Acute Rehab PT Goals Patient Stated Goal: To get stronger and return home PT Goal Formulation: With patient Time For Goal Achievement: 06/27/20 Potential to Achieve Goals: Good    Frequency Min 2X/week   Barriers to discharge Inaccessible home environment      Co-evaluation               AM-PAC PT "6 Clicks" Mobility  Outcome Measure Help needed turning from your back to your side while in a flat bed without using bedrails?: A Lot Help needed moving from lying on your  back to sitting on the side of a flat bed without using bedrails?: A Lot Help needed moving to and from a bed to a chair (including a wheelchair)?: Total Help needed standing up from a chair using your arms (e.g., wheelchair or bedside chair)?: Total Help needed to walk in hospital room?: Total Help needed climbing 3-5 steps with a railing? : Total 6 Click Score: 8    End of Session   Activity Tolerance: Patient tolerated treatment well Patient left: in bed;with call bell/phone within reach;with nursing/sitter in room;with family/visitor present;with bed alarm set Nurse Communication: Mobility status PT Visit Diagnosis: Unsteadiness on feet (R26.81);Difficulty in walking, not elsewhere classified (R26.2);Muscle weakness (generalized) (M62.81)    Time: 1245-8099 PT Time Calculation (min) (ACUTE ONLY): 31 min   Charges:   PT Evaluation $PT Eval Moderate Complexity: 1 Mod PT Treatments $Therapeutic Exercise: 8-22 mins        D. Scott Cayci Mcnabb PT, DPT 06/15/20, 1:11 PM

## 2020-06-15 NOTE — Progress Notes (Signed)
Patient awake most of the night. Awake and alert able to answer orientation question accurately and following commands. Tolerating po mediations, and requesting plenty of water. Reports pain is more controlled but still there. Patient is asking for her family members. HOB elevated to patient's comfort. Will endorse to oncoming RN.

## 2020-06-15 NOTE — Progress Notes (Addendum)
Encouraged to try to eat breakfast. Patient states she is lactose intolerant and refused her milk. Would not eat eggs even though she is not alert to eggs. Poor effort when ask to try to feed herself.  Tremors noted in hands and she spills her thin liquids.  Speech still delayed and I question her cognitive abilities. She has to be reminded not to neglect her left side even though  she has better left sided strength

## 2020-06-16 ENCOUNTER — Ambulatory Visit: Payer: Medicare Other | Admitting: Student in an Organized Health Care Education/Training Program

## 2020-06-16 ENCOUNTER — Inpatient Hospital Stay: Payer: Medicare Other

## 2020-06-16 DIAGNOSIS — G001 Pneumococcal meningitis: Secondary | ICD-10-CM | POA: Diagnosis not present

## 2020-06-16 DIAGNOSIS — E669 Obesity, unspecified: Secondary | ICD-10-CM

## 2020-06-16 DIAGNOSIS — G9340 Encephalopathy, unspecified: Secondary | ICD-10-CM | POA: Diagnosis not present

## 2020-06-16 DIAGNOSIS — B953 Streptococcus pneumoniae as the cause of diseases classified elsewhere: Secondary | ICD-10-CM | POA: Diagnosis not present

## 2020-06-16 DIAGNOSIS — A419 Sepsis, unspecified organism: Secondary | ICD-10-CM | POA: Diagnosis not present

## 2020-06-16 DIAGNOSIS — G959 Disease of spinal cord, unspecified: Secondary | ICD-10-CM | POA: Diagnosis not present

## 2020-06-16 DIAGNOSIS — R509 Fever, unspecified: Secondary | ICD-10-CM

## 2020-06-16 DIAGNOSIS — R7881 Bacteremia: Secondary | ICD-10-CM | POA: Diagnosis not present

## 2020-06-16 DIAGNOSIS — G062 Extradural and subdural abscess, unspecified: Secondary | ICD-10-CM | POA: Diagnosis not present

## 2020-06-16 DIAGNOSIS — N179 Acute kidney failure, unspecified: Secondary | ICD-10-CM

## 2020-06-16 DIAGNOSIS — E66811 Obesity, class 1: Secondary | ICD-10-CM

## 2020-06-16 DIAGNOSIS — M4620 Osteomyelitis of vertebra, site unspecified: Secondary | ICD-10-CM | POA: Diagnosis not present

## 2020-06-16 LAB — BASIC METABOLIC PANEL
Anion gap: 7 (ref 5–15)
BUN: 19 mg/dL (ref 6–20)
CO2: 23 mmol/L (ref 22–32)
Calcium: 8.3 mg/dL — ABNORMAL LOW (ref 8.9–10.3)
Chloride: 109 mmol/L (ref 98–111)
Creatinine, Ser: 0.86 mg/dL (ref 0.44–1.00)
GFR, Estimated: >60 mL/min (ref >60)
Glucose, Bld: 305 mg/dL — ABNORMAL HIGH (ref 70–99)
Potassium: 3.6 mmol/L (ref 3.5–5.1)
Sodium: 139 mmol/L (ref 135–145)

## 2020-06-16 LAB — CBC
HCT: 28.1 % — ABNORMAL LOW (ref 36.0–46.0)
Hemoglobin: 8.7 g/dL — ABNORMAL LOW (ref 12.0–15.0)
MCH: 25.4 pg — ABNORMAL LOW (ref 26.0–34.0)
MCHC: 31 g/dL (ref 30.0–36.0)
MCV: 81.9 fL (ref 80.0–100.0)
Platelets: 255 10*3/uL (ref 150–400)
RBC: 3.43 MIL/uL — ABNORMAL LOW (ref 3.87–5.11)
RDW: 16.9 % — ABNORMAL HIGH (ref 11.5–15.5)
WBC: 11.7 10*3/uL — ABNORMAL HIGH (ref 4.0–10.5)
nRBC: 0 % (ref 0.0–0.2)

## 2020-06-16 LAB — GLUCOSE, CAPILLARY
Glucose-Capillary: 258 mg/dL — ABNORMAL HIGH (ref 70–99)
Glucose-Capillary: 287 mg/dL — ABNORMAL HIGH (ref 70–99)
Glucose-Capillary: 350 mg/dL — ABNORMAL HIGH (ref 70–99)
Glucose-Capillary: 279 mg/dL — ABNORMAL HIGH (ref 70–99)

## 2020-06-16 LAB — MAGNESIUM: Magnesium: 1.8 mg/dL (ref 1.7–2.4)

## 2020-06-16 LAB — PHOSPHORUS: Phosphorus: 2.8 mg/dL (ref 2.5–4.6)

## 2020-06-16 IMAGING — DX DG CHEST 1V PORT
1 series · 1 of 1 positions shown · non-contrast
Comparison: [DATE]

CLINICAL DATA: Fever.

EXAM:
PORTABLE CHEST 1 VIEW

[chest ap]
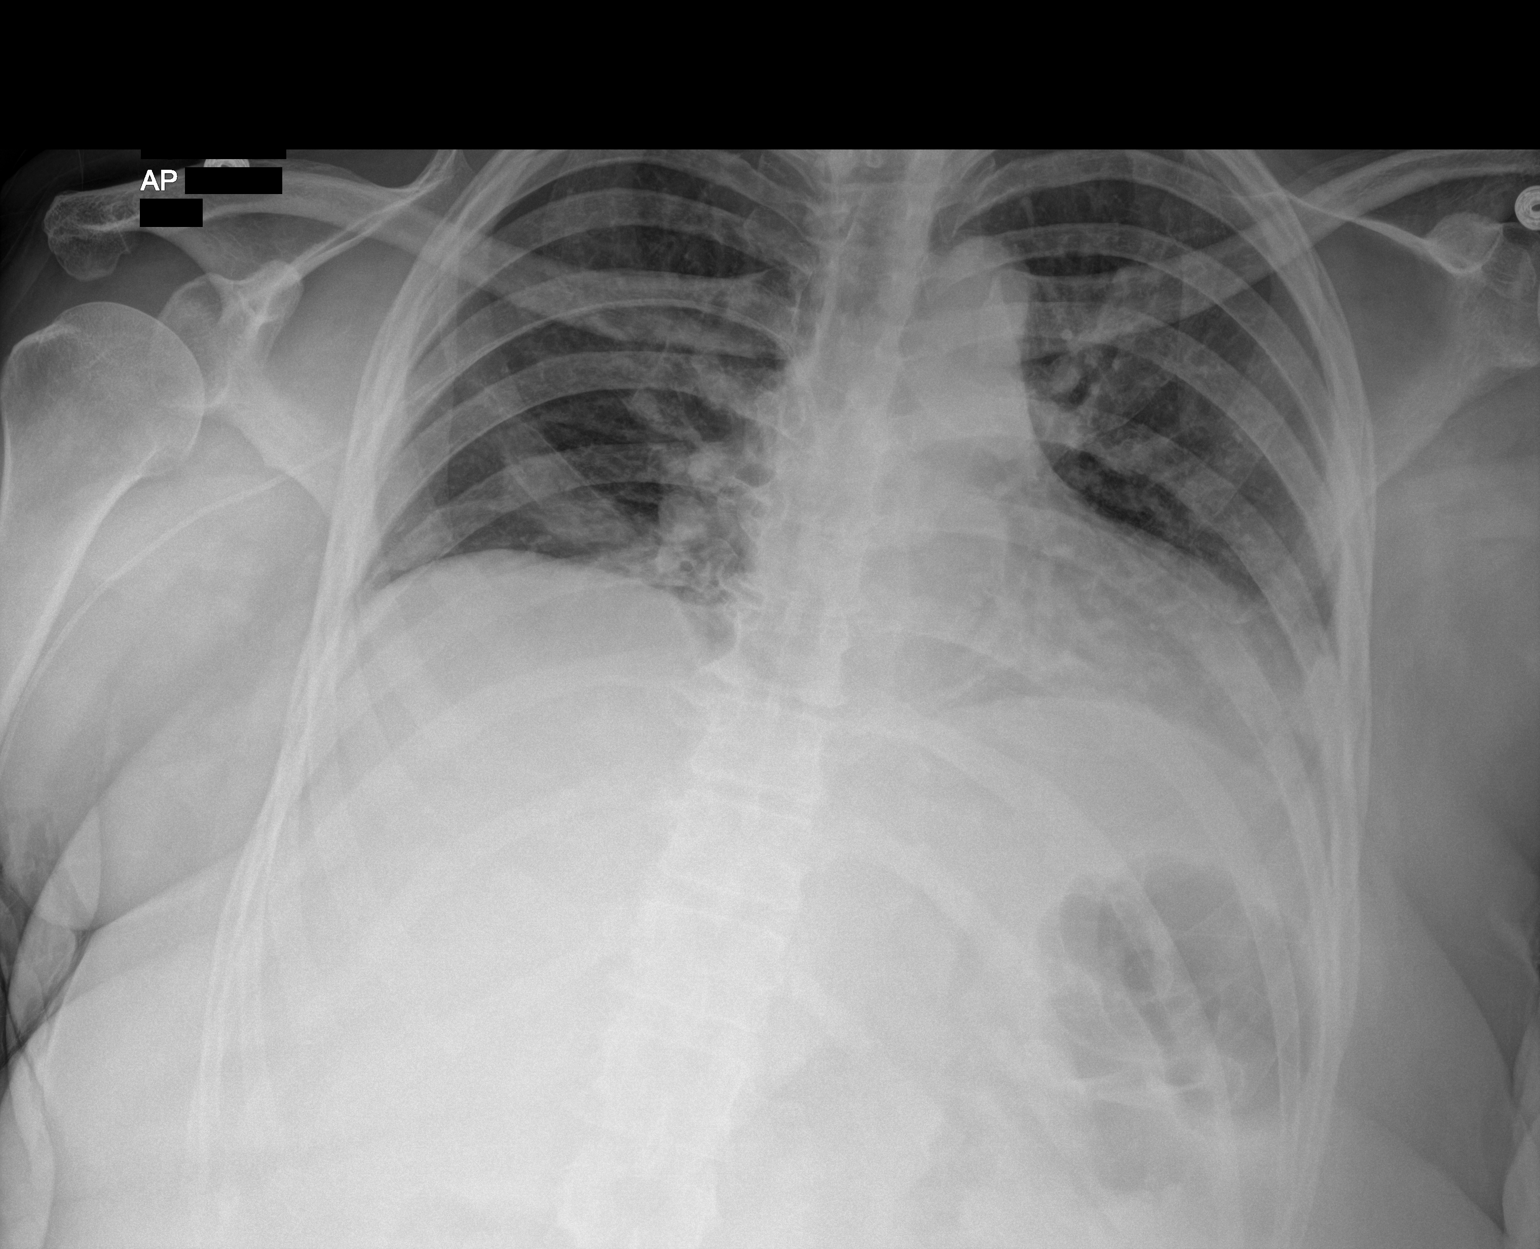

[1 of 1 positions shown; findings below may reference images not displayed]

FINDINGS: The cardiomediastinal silhouette is within normal limits for
portable AP technique. Lung volumes are low, and there are new mild
opacities in both lung bases. No sizable pleural effusion or
pneumothorax is identified. A new right PICC terminates over the
SVC.
IMPRESSION: Low lung volumes with mild bibasilar opacities favoring atelectasis
although pneumonia is not excluded.

## 2020-06-16 IMAGING — MR MR LUMBAR SPINE W/O CM
5 series · 34 of 48 positions shown · non-contrast
Comparison: Lumbar MRI [DATE]

CLINICAL DATA: Pneumococcal meningitis. Follow-up lumbar spine
infection. New fever today.

EXAM:
MRI LUMBAR SPINE WITHOUT CONTRAST
TECHNIQUE: Multiplanar, multisequence MR imaging of the lumbar spine was
performed. No intravenous contrast was administered.

[Series 5: T2 · sagittal · 4.0mm · 1.02mm/px · 7 of 16 slices shown (1 of 2)]
[im 1/16]
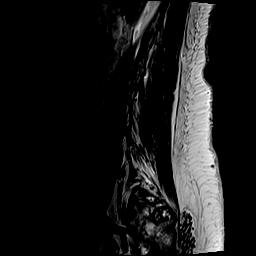
[im 3/16]
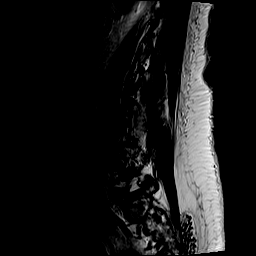
[im 6/16]
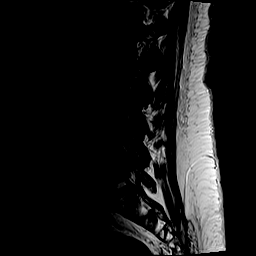
[im 8/16]
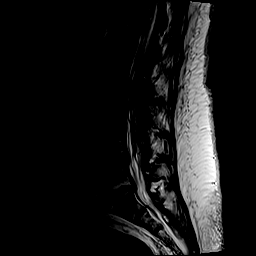
[im 11/16]
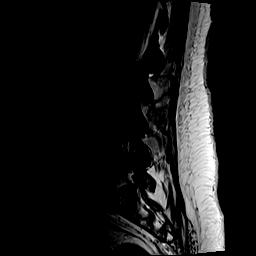
[im 13/16]
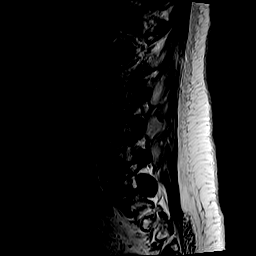
[im 16/16]
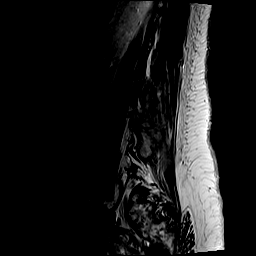

[Series 6: T1 · sagittal · 4.0mm · 1.02mm/px · 7 of 16 slices shown (1 of 2)]
[im 1/16]
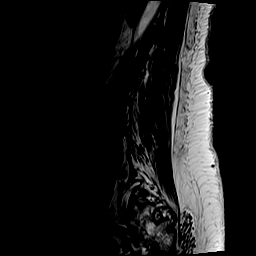
[im 3/16]
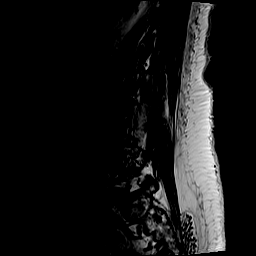
[im 6/16]
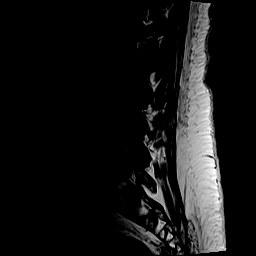
[im 8/16]
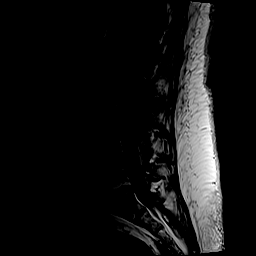
[im 11/16]
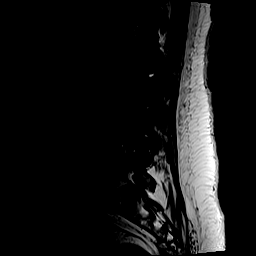
[im 13/16]
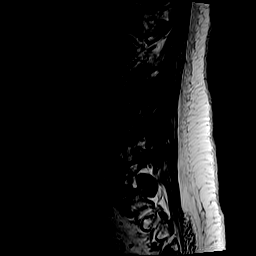
[im 16/16]
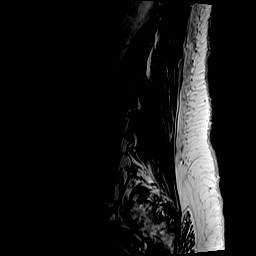

[Series 7: STIR · sagittal · 4.0mm · 0.51mm/px · 4 of 16 slices shown]
[im 1/16]
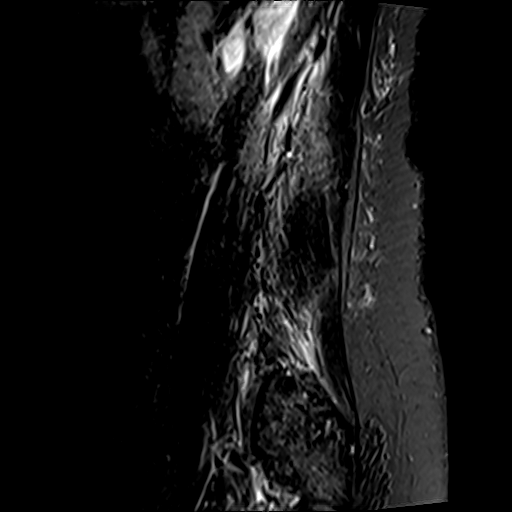
[im 4/16]
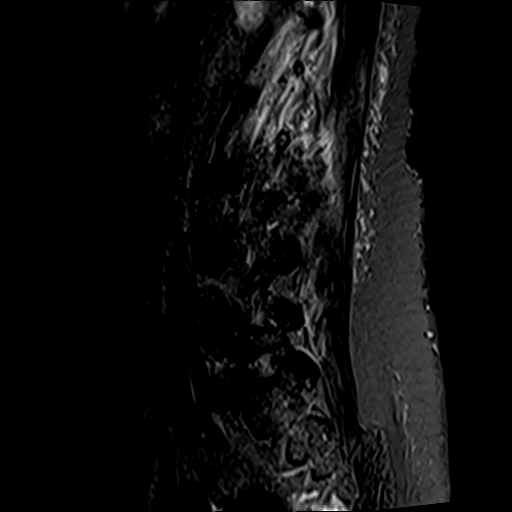
[im 7/16]
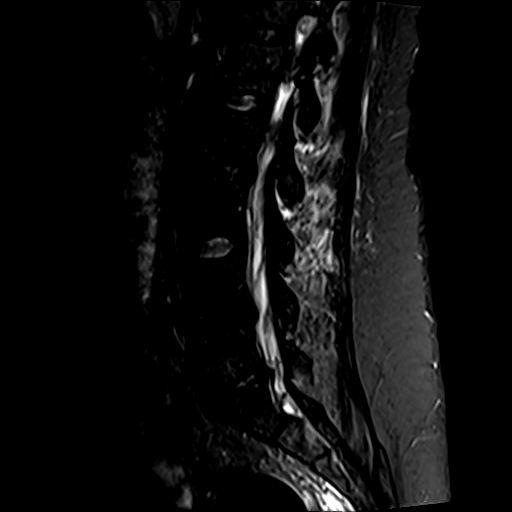
[im 10/16]
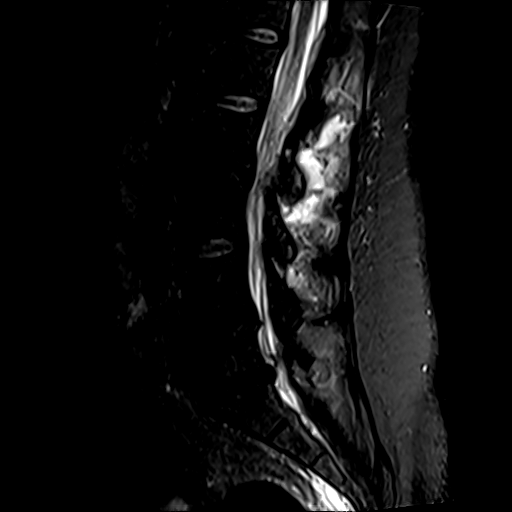

[Series 8: T2 · axial · 4.0mm · 0.78mm/px · z∈[+6,+197]mm · 8 of 36 slices shown (2 of 2)]
[im 1/36]
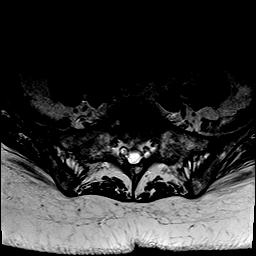
[im 6/36]
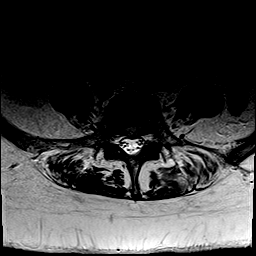
[im 11/36]
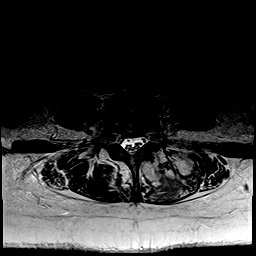
[im 17/36]
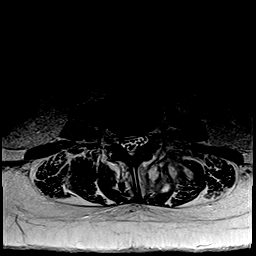
[im 19/36]
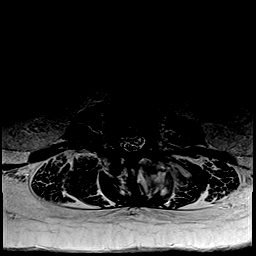
[im 25/36]
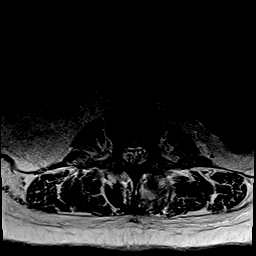
[im 30/36]
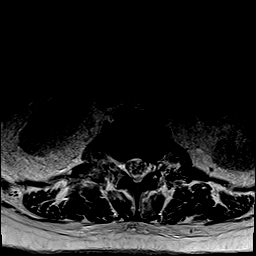
[im 36/36]
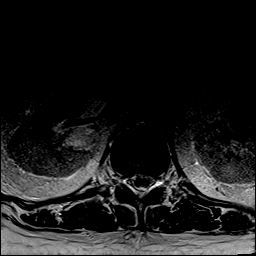

[Series 9: T1 · axial · 4.0mm · 0.39mm/px · z∈[+6,+197]mm · 8 of 36 slices shown (2 of 2)]
[im 1/36]
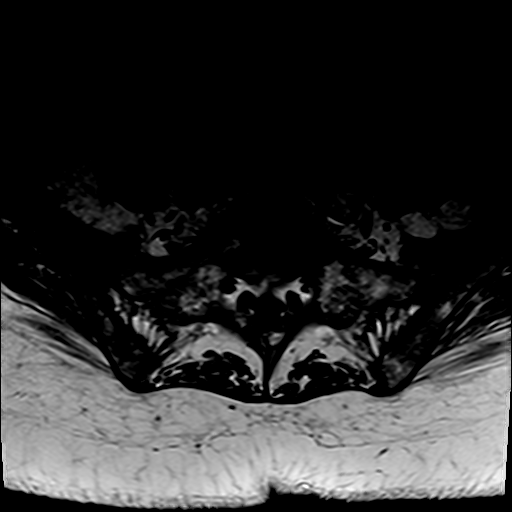
[im 6/36]
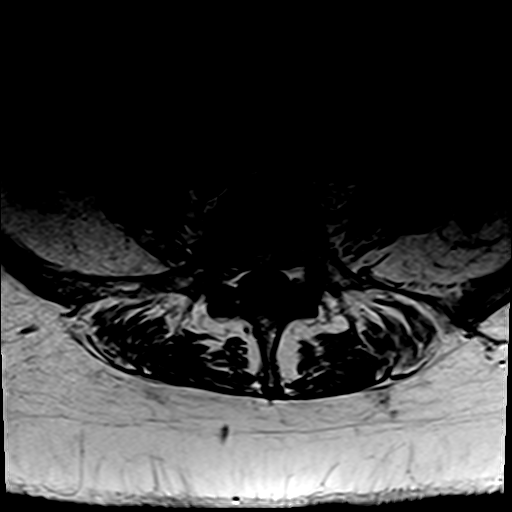
[im 11/36]
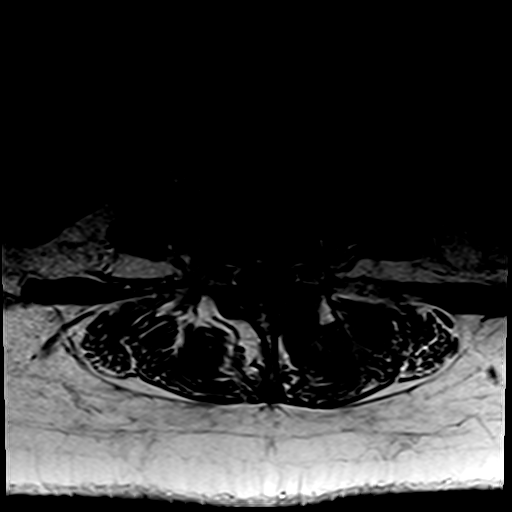
[im 17/36]
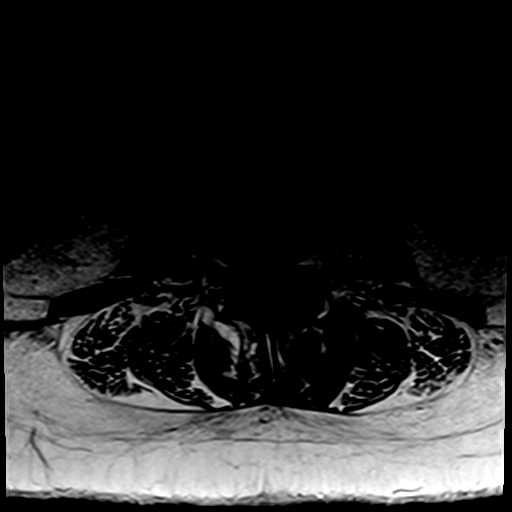
[im 19/36]
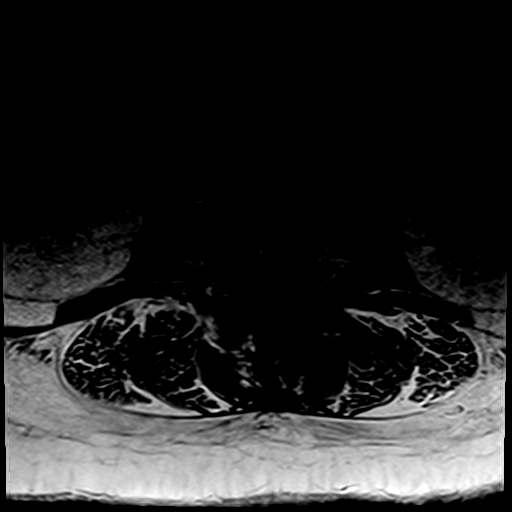
[im 25/36]
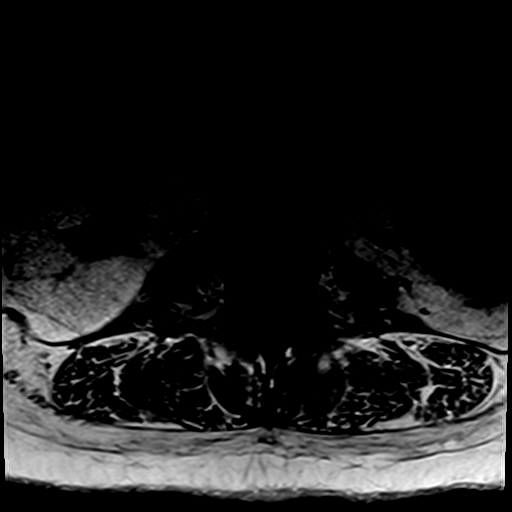
[im 30/36]
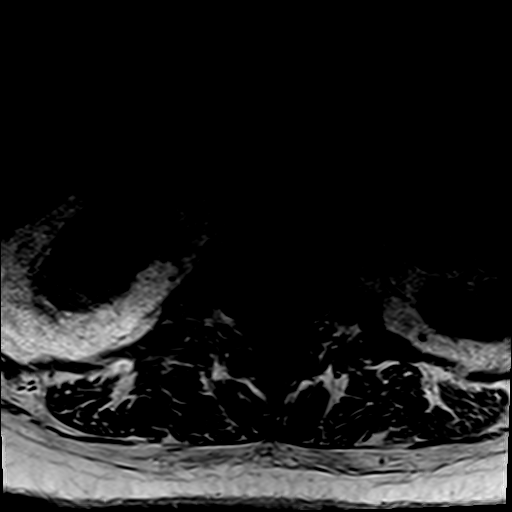
[im 36/36]
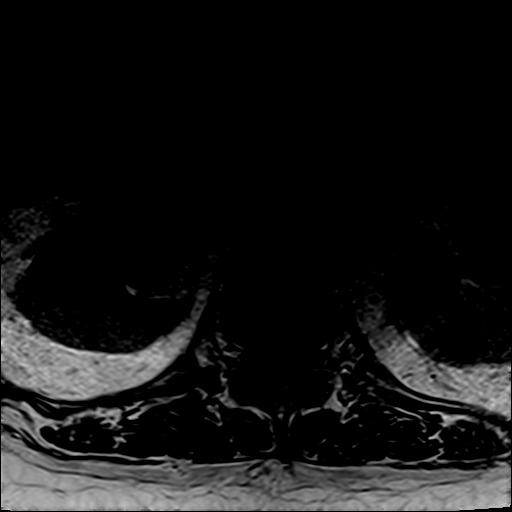

[34 of 48 positions shown; findings below may reference images not displayed]

FINDINGS: Segmentation:  Normal

Alignment:  Normal

Vertebrae: No evidence of discitis. No fracture or bone marrow
edema.

Conus medullaris and cauda equina: Conus extends to the L1-2 level.
Conus and cauda equina appear normal.

Paraspinal and other soft tissues: Posterior paraspinous muscle
edema and multiple abscesses have progressed in the interval. Fluid
collections are present in the muscles which appear to be associated
with the facet joints on the left at L2-3 and L3-4. Abscesses
appears slightly larger with increased surrounding edema. No
right-sided abscess identified. Small epidural abscess posterior on
the left at L2-3 is unchanged.

Disc levels:

L1-2: Negative

L2-3: Shallow central disc protrusion with mild spinal stenosis.
Fluid in the left facet joint which is contiguous with a posterior
paraspinous muscle abscess. Small posterior epidural abscess on the
left unchanged.

L3-4: Shallow ventral epidural fluid collection has progressed
compatible with abscess. Bilateral facet degeneration. Fluid in the
left facet joint appears contiguous with the posterior paraspinous
muscle abscess. No significant spinal stenosis

L4-5: Shallow central disc protrusion. Bilateral facet degeneration.
Mild subarticular stenosis bilaterally

L5-S1: Moderate disc degeneration with disc space narrowing. Shallow
central and left-sided disc protrusion unchanged. Possible left S1
nerve root impingement. Bilateral mild facet degeneration.
IMPRESSION: Findings compatible with lumbar spinal infection. There appears to
be septic arthritis in the left L2-3 and L3-4 facets with adjacent
paraspinous abscesses. Paraspinous abscesses and muscle edema appear
mildly progressive.

Ventral epidural abscess at L3-4 is small but appears mildly
progressive. Small posterior epidural abscess on the left at L2-3 is
unchanged.

## 2020-06-16 MED ORDER — LOSARTAN POTASSIUM 50 MG PO TABS
100.0000 mg | ORAL_TABLET | Freq: Every day | ORAL | Status: DC
  Administered 2020-06-16 – 2020-06-21 (×6): 100 mg via ORAL
  Filled 2020-06-16 (×6): qty 2

## 2020-06-16 MED ORDER — GABAPENTIN 100 MG PO CAPS
200.0000 mg | ORAL_CAPSULE | Freq: Two times a day (BID) | ORAL | Status: DC
Start: 2020-06-16 — End: 2020-06-21
  Administered 2020-06-16 – 2020-06-21 (×9): 200 mg via ORAL
  Filled 2020-06-16 (×10): qty 2

## 2020-06-16 MED ORDER — HYDROCHLOROTHIAZIDE 12.5 MG PO CAPS
12.5000 mg | ORAL_CAPSULE | Freq: Every day | ORAL | Status: DC
  Administered 2020-06-16 – 2020-06-19 (×4): 12.5 mg via ORAL
  Filled 2020-06-16 (×4): qty 1

## 2020-06-16 MED ORDER — ALTEPLASE 2 MG IJ SOLR
2.0000 mg | Freq: Once | INTRAMUSCULAR | Status: AC
  Administered 2020-06-16: 12:00:00 2 mg
  Filled 2020-06-16: qty 2

## 2020-06-16 MED ORDER — LORAZEPAM 2 MG/ML IJ SOLN
0.5000 mg | Freq: Once | INTRAMUSCULAR | Status: AC | PRN
  Administered 2020-06-16: 0.5 mg via INTRAVENOUS
  Filled 2020-06-16: qty 1

## 2020-06-16 MED ORDER — MAGNESIUM OXIDE 400 (241.3 MG) MG PO TABS
400.0000 mg | ORAL_TABLET | Freq: Every day | ORAL | Status: DC
  Administered 2020-06-16 – 2020-06-21 (×6): 400 mg via ORAL
  Filled 2020-06-16 (×6): qty 1

## 2020-06-16 MED ORDER — POTASSIUM CHLORIDE 20 MEQ PO PACK
20.0000 meq | PACK | Freq: Every day | ORAL | Status: DC
  Administered 2020-06-16 – 2020-06-21 (×6): 20 meq via ORAL
  Filled 2020-06-16 (×6): qty 1

## 2020-06-16 MED ORDER — INSULIN GLARGINE 100 UNIT/ML ~~LOC~~ SOLN
24.0000 [IU] | Freq: Every day | SUBCUTANEOUS | Status: DC
  Administered 2020-06-17 – 2020-06-18 (×2): 24 [IU] via SUBCUTANEOUS
  Filled 2020-06-16 (×2): qty 0.24

## 2020-06-16 MED ORDER — AMLODIPINE BESYLATE 5 MG PO TABS
5.0000 mg | ORAL_TABLET | Freq: Every day | ORAL | Status: DC
  Administered 2020-06-16 – 2020-06-17 (×2): 5 mg via ORAL
  Filled 2020-06-16 (×2): qty 1

## 2020-06-16 NOTE — Progress Notes (Signed)
Inpatient Diabetes Program Recommendations  AACE/ADA: New Consensus Statement on Inpatient Glycemic Control (2015)  Target Ranges:  Prepandial:   less than 140 mg/dL      Peak postprandial:   less than 180 mg/dL (1-2 hours)      Critically ill patients:  140 - 180 mg/dL   Results for KEYLY, BALDONADO (MRN [id number]) as of 06/16/2020 07:40  Ref. Range 06/15/2020 01:02 06/15/2020 03:15 06/15/2020 07:55 06/15/2020 11:28 06/15/2020 17:17 06/15/2020 21:22  Glucose-Capillary Latest Ref Range: 70 - 99 mg/dL 294 (H)  11 units NOVOLOG  281 (H)  11 units NOVOLOG  188 (H) 232 (H)   15 units LANTUS 256 (H)  5 units NOVOLOG  300 (H)  3 units NOVOLOG    Results for LUSINE, CORLETT (MRN [id number]) as of 06/16/2020 09:26  Ref. Range 06/16/2020 07:51  Glucose-Capillary Latest Ref Range: 70 - 99 mg/dL 258 (H)     Home DM Meds: Lantus/Lixienatide 48 units Daily       Humalog 10 units tid meal coverage       Metformin 500 mg BID  Current Orders: Lantus 15 units Daily      Novolog Sensitive Correction Scale/ SSI (0-9 units) TID AC + HS    MD- Note CBG 258 this AM.  Afternoon CBGs elevated yesterday as well.  Please consider:  1. Increase Lantus to 20 units Daily (since 15 unit dose already given this AM please consider ordering Lantus 5 units X 1 dose to be given this AM as well)  2. Increase Novolog SSi to the Moderate 0-15 unit scale    --Will follow patient during hospitalization--  Wyn Quaker RN, MSN, CDE Diabetes Coordinator Inpatient Glycemic Control Team Team Pager: [phone] (8a-5p)

## 2020-06-16 NOTE — Progress Notes (Signed)
  Speech Language Pathology Treatment: Dysphagia  Patient Details Name: Tonya Myers MRN: [id number] DOB: 20-Dec-1965 Today's Date: 06/16/2020 Time: 0076-2263 SLP Time Calculation (min) (ACUTE ONLY): 39.8 min  Assessment / Plan / Recommendation Clinical Impression  Tx today at lunch to further assess swallowing abilities and consider diet upgrade. Pt has reported to staff that she doesn't like the diet. Pt seated upright and fed Dysphagia 1 diet. Took about 20 bites and sips. Reported the food was "OK". Did not like the warm apple juice but liked cold apple juice. No overt s/s of aspiration with any tested consistency. Also tolerated trials of thin by straw, single sips. Pt was able to chew and swallow graham cracker pieces but needed extended time and fatigued easily. Discussed with daughter. Pt and daughter agreed to continue Dys 1 diet for now with hopes of upgrading to Dys 2 in 1-3 days. ST to provide trials of Dys 2 as part of treatment session. Vocal quality remained clear throughout assessment.St to follow up 1-2 days.   HPI HPI: Tonya Myers is a 55 y.o. female admitted with aphasia out of proportion to encephalopathy, fever and tachycardia x 2 days. 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      SLP Plan  Continue with current plan of care   St to follow    Recommendations  Diet recommendations: Dysphagia 1 (puree);Thin liquid Liquids provided via: Cup;Straw (SINGLE sips by cup ok) Medication Administration: Crushed with puree Supervision: Staff to assist with self feeding Compensations: Minimize environmental distractions;Slow rate;Small sips/bites Postural Changes and/or Swallow Maneuvers: Seated upright 90 degrees;Upright 30-60 min after meal                Oral Care Recommendations: Oral care before and after PO Follow up Recommendations: Inpatient Rehab;Skilled Nursing facility SLP Visit Diagnosis: Dysphagia, oropharyngeal phase  (R13.12) Plan: Continue with current plan of care       GO                Lucila Maine 06/16/2020, 2:20 PM

## 2020-06-16 NOTE — TOC Initial Note (Signed)
Transition of Care [facility]) - Initial/Assessment Note    Patient Details  Name: Tonya Myers MRN: [id number] Date of Birth: 26-Feb-1966  Transition of Care [facility] [name] Contact:    Shelbie Hutching, RN Phone Number: 06/16/2020, 1:56 PM  Clinical Narrative:                 Patient admitted to the hospital with pneumococcal meningitis and spinal abscess.  ID is following and patient is on IV antibiotics through PICC line. RNCM met with patient and patient's daughter, Basilia Jumbo, at the bedside.  Patient is very lethargic and just sleeps through encounter.  Basilia Jumbo just arrived from [place].  Daughter reports that patient was independent at home before getting sick.  Daughter is tearful.  RNCM discussed discharge planning that inpatient rehab is recommended or the other option maybe SNF.   Currently [name] wants patient to participate in more out of bed therapy before considering for rehab.  RNCM did explain that there are other IP rehab facilities that we could try if [name] does not work out.   TOC will cont to follow and assist with discharge planning.    Expected Discharge Plan: IP Rehab Facility Barriers to Discharge: Continued Medical Work up   Patient Goals and CMS Choice Patient states their goals for this hospitalization and ongoing recovery are:: Patient is unable to voice goals- daughter is at the bedside and just wants the patient to get better CMS Medicare.gov Compare Post Acute Care list provided to:: Patient Represenative (must comment) Choice offered to / list presented to : Adult Children  Expected Discharge Plan and Services Expected Discharge Plan: [place]   Discharge Planning Services: CM Consult Post Acute Care Choice: IP Rehab                                        Prior Living Arrangements/Services   Lives with:: Significant Other Patient language and need for interpreter reviewed:: Yes Do you feel safe going back to the place where you live?: Yes       Need for Family Participation in Patient Care: Yes (Comment) (meningitis) Care giver support system in place?: Yes (comment) (family supports)   Criminal Activity/Legal Involvement Pertinent to Current Situation/Hospitalization: No - Comment as needed  Activities of Daily Living      Permission Sought/Granted Permission sought to share information with : Case Manager,Family Chief Financial Officer Permission granted to share information with : Yes, Verbal Permission Granted  Share Information with NAME: Mittie Bodo, [place]  Permission granted to share info w AGENCY: rehab or SNF  Permission granted to share info w Relationship: boyfriend, sister,daughter     Emotional Assessment Appearance:: Appears stated age Attitude/Demeanor/Rapport: Lethargic   Orientation: : Oriented to Self,Oriented to Place Alcohol / Substance Use: Not Applicable Psych Involvement: No (comment)  Admission diagnosis:  Meningitis [G03.9] Acute encephalopathy [G93.40] Encounter for imaging to screen for metal prior to magnetic resonance imaging (MRI) [B76.28] Acute metabolic encephalopathy [B15.17] Patient Active Problem List   Diagnosis Date Noted  . Obesity (BMI 30.0-34.9)   . Hypomagnesemia   . Hypernatremia   . 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 Epidural abscess   . Pneumococcal meningitis   . Aphasia 06/10/2020  . Hyperglycemic crisis in diabetes mellitus ([place]) 06/10/2020  . Acute metabolic encephalopathy 61/60/7371  . Severe sepsis ([place])   . Diabetic ketoacidosis without coma associated with type 2  diabetes mellitus ([place])   . AKI (acute kidney injury) ([place])   . Elevated LFTs   . Acute encephalopathy 06/09/2020  . Bacterial vaginosis 11/01/2016  . Chronic pain in right shoulder 03/23/2016  . Numbness and tingling 03/23/2016  . Uncontrolled type 2 diabetes mellitus with hyperglycemia, without long-term current use of insulin ([place]) 10/27/2015  . DDD (degenerative disc  disease), cervical 07/29/2015  . Cervical disc disorder with radiculopathy of cervical region 07/29/2015  . Cervical facet syndrome 07/29/2015  . DDD (degenerative disc disease), lumbar 07/29/2015  . Facet syndrome, lumbar 07/29/2015  . Bilateral occipital neuralgia 07/29/2015  . Sacroiliac joint dysfunction 07/29/2015  . DJD of shoulder 07/29/2015  . Herpes simplex vulvovaginitis 05/29/2015  . Acute hemorrhoid 03/11/2015  . CLL (chronic lymphocytic leukemia) ([place]) 01/15/2015  . Hypothyroidism 12/26/2014  . Arthritis 12/26/2014  . Carpal tunnel syndrome, bilateral 12/26/2014  . Controlled type 2 diabetes mellitus without complication, without long-term current use of insulin ([place]) 12/26/2014  . Essential hypertension 12/26/2014  . Hyperlipidemia, mixed 12/26/2014   PCP:  [name], MD Pharmacy:   [facility] [address], [place] - [place] AT [facility] [address] [place] [address] Phone: [phone] Fax: [phone]  [place] [address] Phone: [phone] Fax: [phone]  [id number] [place], [place] - [place] AT [place] [place] [address] Phone: [phone] Fax: [phone]     Social Determinants of Health (SDOH) Interventions    Readmission Risk Interventions No flowsheet data found.

## 2020-06-16 NOTE — Progress Notes (Signed)
Patient ID: Tonya Myers, female   DOB: 1965/07/11, 55 y.o.   MRN: [id number] [facility] PROGRESS NOTE  Anea Fodera WIO:[id number] DOB: 25-Nov-1965 DOA: 06/09/2020 PCP: [name], MD  HPI/Subjective: Patient's blood pressure more elevated today than previous.  Nursing staff states that she is not happy with the dysphagia diet.  Patient able to answer some questions and elaborate today.  Still mental status waxes and wanes a little bit.  Objective: Vitals:   06/16/20 1054 06/16/20 1240  BP: (!) 189/95 (!) 174/96  Pulse: 75 81  Resp: 16 16  Temp: 99 F (37.2 C) (!) 101.2 F (38.4 C)  SpO2: 97% 95%    Intake/Output Summary (Last 24 hours) at 06/16/2020 1255 Last data filed at 06/16/2020 1011 Gross per 24 hour  Intake 320 ml  Output 1900 ml  Net -1580 ml   Filed Weights   06/14/20 0447 06/15/20 0420 06/16/20 0425  Weight: 111.1 kg 110.5 kg (P) 110.1 kg    ROS: Review of Systems  Unable to perform ROS: Acuity of condition  Respiratory: Negative for shortness of breath.   Cardiovascular: Negative for chest pain.  Gastrointestinal: Negative for abdominal pain.   Exam: Physical Exam HENT:     Head: Normocephalic.     Mouth/Throat:     Pharynx: No oropharyngeal exudate.  Eyes:     General: Lids are normal.     Conjunctiva/sclera: Conjunctivae normal.     Pupils: Pupils are equal, round, and reactive to light.  Cardiovascular:     Rate and Rhythm: Normal rate and regular rhythm.     Heart sounds: Normal heart sounds, S1 normal and S2 normal.  Pulmonary:     Breath sounds: Normal breath sounds. No decreased breath sounds, wheezing, rhonchi or rales.  Abdominal:     Palpations: Abdomen is soft.     Tenderness: There is no abdominal tenderness.  Musculoskeletal:     Right lower leg: Swelling present.     Left lower leg: Swelling present.  Skin:    General: Skin is warm.     Findings: No rash.  Neurological:     Mental Status: She is confused.      Comments: Patient answers a few simple yes or no questions.  Unable to straight leg raise today but able to flex and extend the toes.  Left hand grip strength good but weak on the left arm.  Right grip strength good in right arm strength better than the left.       Data Reviewed: Basic Metabolic Panel: Recent Labs  Lab 06/11/20 0316 06/12/20 1027 06/13/20 0708 06/14/20 0446 06/15/20 0425 06/16/20 0502  NA 145 149* 151* 153* 152* 139  K 3.7 4.5 4.6 4.0  --  3.6  CL 113* 117* 119* 122*  --  109  CO2 21* 20* 18* 23  --  23  GLUCOSE 308* 199* 174* 303*  --  305*  BUN 20 39* 44* 41*  --  19  CREATININE 1.01* 1.13* 1.21* 1.12*  --  0.86  CALCIUM 8.9 9.0 8.8* 8.6*  --  8.3*  MG  --   --  2.4 2.9* 1.6* 1.8  PHOS  --   --  5.5* 3.7 2.3* 2.8   Liver Function Tests: Recent Labs  Lab 06/09/20 2037 06/10/20 0600 06/12/20 1027  AST 44* 39 29  ALT 31 27 14   ALKPHOS 211* 204* 133*  BILITOT 2.2* 2.4* 1.4*  PROT 7.6 7.2 5.9*  ALBUMIN 2.8* 2.4* 1.7*  Recent Labs  Lab 06/09/20 2145  AMMONIA 14   CBC: Recent Labs  Lab 06/11/20 0316 06/12/20 1027 06/13/20 1702 06/14/20 0446 06/16/20 0502  WBC 12.5* 17.0* 11.4* 12.5* 11.7*  NEUTROABS  --   --  8.2*  --   --   HGB 10.8* 8.6* 7.1* 8.2* 8.7*  HCT 31.9* 26.3* 21.7* 25.4* 28.1*  MCV 77.1* 77.6* 80.4 80.6 81.9  PLT 272 232 181 230 255    CBG: Recent Labs  Lab 06/15/20 1128 06/15/20 1717 06/15/20 2122 06/16/20 0751 06/16/20 1234  GLUCAP 232* 256* 300* 258* 287*    Recent Results (from the past 240 hour(s))  Resp Panel by RT-PCR (Flu A&B, Covid) Nasopharyngeal Swab     Status: None   Collection Time: 06/09/20  9:45 PM   Specimen: Nasopharyngeal Swab; Nasopharyngeal(NP) swabs in vial transport medium  Result Value Ref Range Status   SARS Coronavirus 2 by RT PCR NEGATIVE NEGATIVE Final    Comment: (NOTE) SARS-CoV-2 target nucleic acids are NOT DETECTED.  The SARS-CoV-2 RNA is generally detectable in upper  respiratory specimens during the acute phase of infection. The lowest concentration of SARS-CoV-2 viral copies this assay can detect is 138 copies/mL. A negative result does not preclude SARS-Cov-2 infection and should not be used as the sole basis for treatment or other patient management decisions. A negative result may occur with  improper specimen collection/handling, submission of specimen other than nasopharyngeal swab, presence of viral mutation(s) within the areas targeted by this assay, and inadequate number of viral copies(<138 copies/mL). A negative result must be combined with clinical observations, patient history, and epidemiological information. The expected result is Negative.  Fact Sheet for Patients:  EntrepreneurPulse.com.au  Fact Sheet for Healthcare Providers:  IncredibleEmployment.be  This test is no t yet approved or cleared by the [place] FDA and  has been authorized for detection and/or diagnosis of SARS-CoV-2 by FDA under an Emergency Use Authorization (EUA). This EUA will remain  in effect (meaning this test can be used) for the duration of the COVID-19 declaration under Section 564(b)(1) of the Act, 21 U.S.C.section 360bbb-3(b)(1), unless the authorization is terminated  or revoked sooner.       Influenza A by PCR NEGATIVE NEGATIVE Final   Influenza B by PCR NEGATIVE NEGATIVE Final    Comment: (NOTE) The Xpert Xpress SARS-CoV-2/FLU/RSV plus assay is intended as an aid in the diagnosis of influenza from Nasopharyngeal swab specimens and should not be used as a sole basis for treatment. Nasal washings and aspirates are unacceptable for Xpert Xpress SARS-CoV-2/FLU/RSV testing.  Fact Sheet for Patients: EntrepreneurPulse.com.au  Fact Sheet for Healthcare Providers: IncredibleEmployment.be  This test is not yet approved or cleared by the [place] FDA and has been  authorized for detection and/or diagnosis of SARS-CoV-2 by FDA under an Emergency Use Authorization (EUA). This EUA will remain in effect (meaning this test can be used) for the duration of the COVID-19 declaration under Section 564(b)(1) of the Act, 21 U.S.C. section 360bbb-3(b)(1), unless the authorization is terminated or revoked.  Performed at [facility], [address] [place], [place] [address]   CULTURE, BLOOD (ROUTINE X 2) w Reflex to ID Panel     Status: Abnormal   Collection Time: 06/10/20  9:40 AM   Specimen: BLOOD  Result Value Ref Range Status   Specimen Description   Final    BLOOD BLOOD LEFT FOREARM Performed at [facility], [address], [place], [place] [address]  Special Requests   Final    BOTTLES DRAWN AEROBIC AND ANAEROBIC Blood Culture adequate volume Performed at [facility], [address] [place], [place] [address]    Culture  Setup Time   Final    Organism ID to follow IN BOTH AEROBIC AND ANAEROBIC BOTTLES GRAM POSITIVE COCCI CRITICAL RESULT CALLED TO, READ BACK BY AND VERIFIED WITH: SUSAN WATSON @1941  ON 06/10/20 SKL Performed at [facility], [address] [place], [place] [address]    Culture STREPTOCOCCUS PNEUMONIAE (A)  Final   Report Status 06/13/2020 FINAL  Final   Organism ID, Bacteria STREPTOCOCCUS PNEUMONIAE  Final      Susceptibility   Streptococcus pneumoniae - MIC*    ERYTHROMYCIN >=8 RESISTANT Resistant     LEVOFLOXACIN 0.5 SENSITIVE Sensitive     VANCOMYCIN 0.5 SENSITIVE Sensitive     PENICILLIN (meningitis) <=0.06 SENSITIVE Sensitive     PENO - penicillin <=0.06      PENICILLIN (non-meningitis) <=0.06 SENSITIVE Sensitive     PENICILLIN (oral) <=0.06 SENSITIVE Sensitive     CEFTRIAXONE (non-meningitis) <=0.12 SENSITIVE Sensitive     CEFTRIAXONE (meningitis) <=0.12 SENSITIVE Sensitive     * STREPTOCOCCUS PNEUMONIAE  Blood Culture ID Panel (Reflexed)     Status: Abnormal   Collection  Time: 06/10/20  9:40 AM  Result Value Ref Range Status   Enterococcus faecalis NOT DETECTED NOT DETECTED Final   Enterococcus Faecium NOT DETECTED NOT DETECTED Final   Listeria monocytogenes NOT DETECTED NOT DETECTED Final   Staphylococcus species NOT DETECTED NOT DETECTED Final   Staphylococcus aureus (BCID) NOT DETECTED NOT DETECTED Final   Staphylococcus epidermidis NOT DETECTED NOT DETECTED Final   Staphylococcus lugdunensis NOT DETECTED NOT DETECTED Final   Streptococcus species DETECTED (A) NOT DETECTED Final    Comment: CRITICAL RESULT CALLED TO, READ BACK BY AND VERIFIED WITH: SUSAN WATSON @1941  ON 06/10/20 SKL    Streptococcus agalactiae NOT DETECTED NOT DETECTED Final   Streptococcus pneumoniae DETECTED (A) NOT DETECTED Final    Comment: CRITICAL RESULT CALLED TO, READ BACK BY AND VERIFIED WITH: SUSAN WATSON @1941  ON 06/10/20 SKL    Streptococcus pyogenes NOT DETECTED NOT DETECTED Final   A.calcoaceticus-baumannii NOT DETECTED NOT DETECTED Final   Bacteroides fragilis NOT DETECTED NOT DETECTED Final   Enterobacterales NOT DETECTED NOT DETECTED Final   Enterobacter cloacae complex NOT DETECTED NOT DETECTED Final   Escherichia coli NOT DETECTED NOT DETECTED Final   Klebsiella aerogenes NOT DETECTED NOT DETECTED Final   Klebsiella oxytoca NOT DETECTED NOT DETECTED Final   Klebsiella pneumoniae NOT DETECTED NOT DETECTED Final   Proteus species NOT DETECTED NOT DETECTED Final   Salmonella species NOT DETECTED NOT DETECTED Final   Serratia marcescens NOT DETECTED NOT DETECTED Final   Haemophilus influenzae NOT DETECTED NOT DETECTED Final   Neisseria meningitidis NOT DETECTED NOT DETECTED Final   Pseudomonas aeruginosa NOT DETECTED NOT DETECTED Final   Stenotrophomonas maltophilia NOT DETECTED NOT DETECTED Final   Candida albicans NOT DETECTED NOT DETECTED Final   Candida auris NOT DETECTED NOT DETECTED Final   Candida glabrata NOT DETECTED NOT DETECTED Final   Candida krusei  NOT DETECTED NOT DETECTED Final   Candida parapsilosis NOT DETECTED NOT DETECTED Final   Candida tropicalis NOT DETECTED NOT DETECTED Final   Cryptococcus neoformans/gattii NOT DETECTED NOT DETECTED Final    Comment: Performed at [facility], [address] [place], [place] [address]  CULTURE, BLOOD (ROUTINE X 2) w Reflex to ID Panel  Status: None   Collection Time: 06/10/20 11:26 AM   Specimen: BLOOD  Result Value Ref Range Status   Specimen Description BLOOD LEFT ARM  Final   Special Requests   Final    BOTTLES DRAWN AEROBIC ONLY Blood Culture adequate volume   Culture   Final    NO GROWTH 5 DAYS Performed at [facility], [address] [place], [place] [address]    Report Status 06/15/2020 FINAL  Final  CSF culture w Stat Gram Stain     Status: None   Collection Time: 06/10/20  2:19 PM   Specimen: CSF; Cerebrospinal Fluid  Result Value Ref Range Status   Specimen Description   Final    CSF Performed at [facility], [address], [place], [place] [address]    Special Requests   Final    NONE Performed at [facility], [address] [place], [place] [address]    Gram Stain   Final    GRAM POSITIVE DIPLOCOCCI CRITICAL RESULT CALLED TO, READ BACK BY AND VERIFIED WITH: KORY ANDREWS @1627  06/10/20 MJU WBC SEEN RBC SEEN Performed at [facility], [address] [place], [place] [address]    Culture MODERATE STREPTOCOCCUS PNEUMONIAE  Final   Report Status 06/12/2020 FINAL  Final   Organism ID, Bacteria STREPTOCOCCUS PNEUMONIAE  Final      Susceptibility   Streptococcus pneumoniae - MIC*    ERYTHROMYCIN >=8 RESISTANT Resistant     LEVOFLOXACIN 1 SENSITIVE Sensitive     VANCOMYCIN 0.25 SENSITIVE Sensitive     PENICILLIN (meningitis) <=0.06 SENSITIVE Sensitive     PENO - penicillin <=0.06      PENICILLIN (non-meningitis) <=0.06 SENSITIVE Sensitive     PENICILLIN (oral) <=0.06 SENSITIVE Sensitive     CEFTRIAXONE  (non-meningitis) <=0.12 SENSITIVE Sensitive     CEFTRIAXONE (meningitis) <=0.12 SENSITIVE Sensitive     * MODERATE STREPTOCOCCUS PNEUMONIAE  MRSA PCR Screening     Status: None   Collection Time: 06/11/20  4:56 AM   Specimen: Nasopharyngeal  Result Value Ref Range Status   MRSA by PCR NEGATIVE NEGATIVE Final    Comment:        The GeneXpert MRSA Assay (FDA approved for NASAL specimens only), is one component of a comprehensive MRSA colonization surveillance program. It is not intended to diagnose MRSA infection nor to guide or monitor treatment for MRSA infections. Performed at [facility], [address] [place], [place] [address]   Culture, blood (Routine X 2) w Reflex to ID Panel     Status: None (Preliminary result)   Collection Time: 06/12/20 12:41 AM   Specimen: BLOOD  Result Value Ref Range Status   Specimen Description BLOOD BLOOD RIGHT HAND  Final   Special Requests   Final    BOTTLES DRAWN AEROBIC AND ANAEROBIC Blood Culture adequate volume   Culture   Final    NO GROWTH 4 DAYS Performed at [facility], [address], [place], [place] [address]    Report Status PENDING  Incomplete  Culture, blood (Routine X 2) w Reflex to ID Panel     Status: None (Preliminary result)   Collection Time: 06/12/20 12:41 AM   Specimen: BLOOD  Result Value Ref Range Status   Specimen Description BLOOD BLOOD LEFT HAND  Final   Special Requests IN PEDIATRIC BOTTLE Blood Culture adequate volume  Final   Culture   Final    NO GROWTH 4 DAYS Performed at [facility], [place], [place] [address]    Report Status PENDING  Incomplete      Scheduled Meds: . amLODipine  5 mg Oral Daily  . chlorhexidine  15 mL Mouth Rinse BID  . Chlorhexidine Gluconate Cloth  6 each Topical Q0600  . enoxaparin (LOVENOX) injection  40 mg Subcutaneous Q24H  . gabapentin  200 mg Oral BID  . hydrochlorothiazide  12.5 mg Oral Daily  . insulin aspart  0-5  Units Subcutaneous QHS  . insulin aspart  0-9 Units Subcutaneous TID WC  . [START ON 06/17/2020] insulin glargine  24 Units Subcutaneous Daily  . levothyroxine  150 mcg Oral Q0600  . losartan  100 mg Oral Daily  . magnesium oxide  400 mg Oral Daily  . mouth rinse  15 mL Mouth Rinse q12n4p  . pantoprazole (PROTONIX) IV  40 mg Intravenous Q24H  . potassium chloride  20 mEq Oral Daily  . sodium chloride flush  10-40 mL Intracatheter Q12H  . [START ON 06/19/2020] thiamine injection  100 mg Intravenous Daily  . traZODone  50 mg Oral QHS   Continuous Infusions: . cefTRIAXone (ROCEPHIN)  IV Stopped (06/16/20 0606)  . sodium chloride 0.9 % 25 mL with thiamine (B-1) 250 mg infusion 50 mL/hr at 06/16/20 1035    Assessment/Plan:  1. 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  Patient also had acute metabolic encephalopathy and acute kidney injury.  Initial blood cultures and CSF cultures positive for pneumococcus.  Continue high-dose Rocephin.  Patient has PICC line.  Patient had a new fever today of 101.  Infectious disease recommended repeat MRI of the spine 2. Paraspinal abscess, small epidural abscess and septic arthritis L2-L3 and L3-L4.  Continue IV Rocephin.  Likely will need 6 weeks of IV antibiotics.  With new fever today we will repeat MRI of the lumbar spine.  Reconsulted neurology 3. Acute metabolic encephalopathy currently on dysphagia diet 4. DKA on presentation.  Increase Lantus insulin to 24 units daily with sliding scale 5. Anemia of chronic disease.  Hemoglobin today 8.7 6. Acute kidney injury on presentation with creatinine of 1.56.  Today's and creatinine improved to 0.86. 7. Essential hypertension needed to add Norvasc today and already gave hydrochlorothiazide and losartan.  Continue to monitor blood pressure. 8. Obesity with a BMI of 32.92 9. Elevated liver function test secondary to sepsis 10. History of CLL 11. Hypernatremia has  resolved 12. Hypomagnesemia and hypophosphatemia.  Replaced yesterday        Code Status:     Code Status Orders  (From admission, onward)         Start     Ordered   06/09/20 2313  Full code  Continuous        06/09/20 2314        Code Status History    This patient has a current code status but no historical code status.   Advance Care Planning Activity     Family Communication: Daughter at the bedside Disposition Plan: Status is: Inpatient  Dispo: The patient is from: Home              Anticipated d/c is to: [name] versus subacute rehab              Anticipated d/c date is: With fever today likely will need a few more days here in the hospital              Patient currently with fever  today we will have to repeat MRI of the lumbar spine   Difficult to place patient.  Hopefully not.  Time spent: 28 minutes  [place]

## 2020-06-16 NOTE — Progress Notes (Signed)
   06/16/20 0758  Assess: MEWS Score  Temp 97.8 F (36.6 C)  BP (!) 217/86  Pulse Rate 72  Resp 16  SpO2 94 %  O2 Device Room Air  Assess: MEWS Score  MEWS Temp 0  MEWS Systolic 2  MEWS Pulse 0  MEWS RR 0  MEWS LOC 0  MEWS Score 2  MEWS Score Color Yellow  Assess: if the MEWS score is Yellow or Red  Were vital signs taken at a resting state? Yes  Focused Assessment No change from prior assessment  Early Detection of Sepsis Score *See Row Information* Low  MEWS guidelines implemented *See Row Information* Yes  Treat  MEWS Interventions Administered prn meds/treatments  Take Vital Signs  Increase Vital Sign Frequency  Yellow: Q 2hr X 2 then Q 4hr X 2, if remains yellow, continue Q 4hrs  Notify: Charge Nurse/RN  Name of Charge Nurse/RN Notified Caryl Pina  Date Charge Nurse/RN Notified 06/16/20  Time Charge Nurse/RN Notified 1100  Notify: Provider  Provider Name/Title Loletha Grayer  Date Provider Notified 06/16/20  Time Provider Notified 1055  Notification Type Page  Notification Reason Other (Comment) (elevated BP/ HR)  Provider response See new orders  Date of Provider Response 06/16/20  Time of Provider Response 1055

## 2020-06-16 NOTE — Progress Notes (Signed)
Subjective: Brief review of HPI: Tonya Myers is a 55 y.o. female admitted with aphasia out of proportion to encephalopathy, fever and tachycardia x 2 days. 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 She had shown consistent improvement on Ceftriaxone. She was awake, alert and able to engage in a conversation on Sunday's Neurology follow up visit.   Subjective for this AM: Per primary team, was unable to perform straight-leg raise this AM, which she was able to do yesterday. Was able to flex and extend her toes. RUE strength was found to be weak, but LUE was weaker, except for grip strength. Her mentation was still with waxing and wanign this AM. Received sedating medication for MRI. Now with decreased level of alertness and decreased effort on exam.   Objective: Current vital signs: BP (!) 177/93 (BP Location: Left Arm)   Pulse 98   Temp 98.5 F (36.9 C)   Resp 20   Ht 6' (1.829 m)   Wt (P) 110.1 kg   SpO2 96%   BMI (P) 32.92 kg/m  Vital signs in last 24 hours: Temp:  [97.8 F (36.6 C)-101.2 F (38.4 C)] 98.5 F (36.9 C) (02/21 2213) Pulse Rate:  [64-114] 98 (02/21 2213) Resp:  [16-22] 20 (02/21 2213) BP: (139-217)/(73-100) 177/93 (02/21 2213) SpO2:  [94 %-99 %] 96 % (02/21 2213) Weight:  [110.1 kg] (P) 110.1 kg (02/21 0425)  Intake/Output from previous day: 02/20 0701 - 02/21 0700 In: 7858 [P.O.:1050; I.V.:43; IV Piggyback:400] Out: 2825 [Urine:2825] Intake/Output this shift: No intake/output data recorded. Nutritional status:  Diet Order            DIET - DYS 1 Room service appropriate? Yes; Fluid consistency: Thin  Diet effective now                HEENT: [place]/AT Lungs: Respirations unlabored Ext: Edema noted.   Neurologic Exam: Ment: Sedated affect. Increased latencies of verbal and motor responses. Decreased effort with exam. Poor eye contact. Able to count fingers, follow commands and answer simple questions.  CN: PERRL. EOMI. Temp  sensation equal. Face symmetric. Tongue protrudes midline.  Motor: Poor effort which is worse when testing BLE. Inconsistent responses to motor commands, but max strength in BUE is 4-/5 proximally and distally with rapid fatiguability.  BLE with max strength of 4/5 to ADF and APF. Responses to commands for knee extension, knee flexion and hip flexion are inconsistent but overall trend is for LLE with greater weakness than on the right. RLE max strength elicited is 8-5/0 to these modalities and on the left it is 2/5.  Sensory: Subjectively intact to FT BUE, bilateral feet, legs below her knees and thighs. Temp sensation testing with variable/inconsistent responses Reflexes: 12 bilateral brachioradialis. 3+ bilateral patellae. 2+ right achilles, 1+ left achilles.  Cerebellar: Did not participate.   Lab Results: Results for orders placed or performed during the hospital encounter of 06/09/20 (from the past 48 hour(s))  Glucose, capillary     Status: Abnormal   Collection Time: 06/14/20 11:08 PM  Result Value Ref Range   Glucose-Capillary 303 (H) 70 - 99 mg/dL    Comment: Glucose reference range applies only to samples taken after fasting for at least 8 hours.  Glucose, capillary     Status: Abnormal   Collection Time: 06/15/20  1:02 AM  Result Value Ref Range   Glucose-Capillary 294 (H) 70 - 99 mg/dL    Comment: Glucose reference range applies only to samples taken after fasting  for at least 8 hours.  Glucose, capillary     Status: Abnormal   Collection Time: 06/15/20  3:15 AM  Result Value Ref Range   Glucose-Capillary 281 (H) 70 - 99 mg/dL    Comment: Glucose reference range applies only to samples taken after fasting for at least 8 hours.  Magnesium     Status: Abnormal   Collection Time: 06/15/20  4:25 AM  Result Value Ref Range   Magnesium 1.6 (L) 1.7 - 2.4 mg/dL    Comment: Performed at [facility], [address] [place], [place] [address]  Phosphorus     Status: Abnormal    Collection Time: 06/15/20  4:25 AM  Result Value Ref Range   Phosphorus 2.3 (L) 2.5 - 4.6 mg/dL    Comment: Performed at [facility], [address] [place], [place] [address]  Sodium     Status: Abnormal   Collection Time: 06/15/20  4:25 AM  Result Value Ref Range   Sodium 152 (H) 135 - 145 mmol/L    Comment: Performed at [facility], [address] [place], [place] [address]  Glucose, capillary     Status: Abnormal   Collection Time: 06/15/20  7:55 AM  Result Value Ref Range   Glucose-Capillary 188 (H) 70 - 99 mg/dL    Comment: Glucose reference range applies only to samples taken after fasting for at least 8 hours.  Glucose, capillary     Status: Abnormal   Collection Time: 06/15/20 11:28 AM  Result Value Ref Range   Glucose-Capillary 232 (H) 70 - 99 mg/dL    Comment: Glucose reference range applies only to samples taken after fasting for at least 8 hours.  Glucose, capillary     Status: Abnormal   Collection Time: 06/15/20  5:17 PM  Result Value Ref Range   Glucose-Capillary 256 (H) 70 - 99 mg/dL    Comment: Glucose reference range applies only to samples taken after fasting for at least 8 hours.   Comment 1 Notify RN   Glucose, capillary     Status: Abnormal   Collection Time: 06/15/20  9:22 PM  Result Value Ref Range   Glucose-Capillary 300 (H) 70 - 99 mg/dL    Comment: Glucose reference range applies only to samples taken after fasting for at least 8 hours.  Magnesium     Status: None   Collection Time: 06/16/20  5:02 AM  Result Value Ref Range   Magnesium 1.8 1.7 - 2.4 mg/dL    Comment: Performed at [facility], [address] [place], [place] [address]  Phosphorus     Status: None   Collection Time: 06/16/20  5:02 AM  Result Value Ref Range   Phosphorus 2.8 2.5 - 4.6 mg/dL    Comment: Performed at [facility], [address] [place], [place] [address]  CBC     Status: Abnormal   Collection Time: 06/16/20  5:02 AM   Result Value Ref Range   WBC 11.7 (H) 4.0 - 10.5 K/uL   RBC 3.43 (L) 3.87 - 5.11 MIL/uL   Hemoglobin 8.7 (L) 12.0 - 15.0 g/dL   HCT 28.1 (L) 36.0 - 46.0 %   MCV 81.9 80.0 - 100.0 fL   MCH 25.4 (L) 26.0 - 34.0 pg   MCHC 31.0 30.0 - 36.0 g/dL   RDW 16.9 (H) 11.5 - 15.5 %   Platelets 255 150 - 400 K/uL   nRBC 0.0 0.0 - 0.2 %  Comment: Performed at [facility], [address] [place], [place] [address]  Basic metabolic panel     Status: Abnormal   Collection Time: 06/16/20  5:02 AM  Result Value Ref Range   Sodium 139 135 - 145 mmol/L   Potassium 3.6 3.5 - 5.1 mmol/L   Chloride 109 98 - 111 mmol/L   CO2 23 22 - 32 mmol/L   Glucose, Bld 305 (H) 70 - 99 mg/dL    Comment: Glucose reference range applies only to samples taken after fasting for at least 8 hours.   BUN 19 6 - 20 mg/dL   Creatinine, Ser 0.86 0.44 - 1.00 mg/dL   Calcium 8.3 (L) 8.9 - 10.3 mg/dL   GFR, Estimated >60 >60 mL/min    Comment: (NOTE) Calculated using the CKD-EPI Creatinine Equation (2021)    Anion gap 7 5 - 15    Comment: Performed at [facility], [address] [place], [place] [address]  Glucose, capillary     Status: Abnormal   Collection Time: 06/16/20  7:51 AM  Result Value Ref Range   Glucose-Capillary 258 (H) 70 - 99 mg/dL    Comment: Glucose reference range applies only to samples taken after fasting for at least 8 hours.  Glucose, capillary     Status: Abnormal   Collection Time: 06/16/20 12:34 PM  Result Value Ref Range   Glucose-Capillary 287 (H) 70 - 99 mg/dL    Comment: Glucose reference range applies only to samples taken after fasting for at least 8 hours.  Glucose, capillary     Status: Abnormal   Collection Time: 06/16/20  4:20 PM  Result Value Ref Range   Glucose-Capillary 350 (H) 70 - 99 mg/dL    Comment: Glucose reference range applies only to samples taken after fasting for at least 8 hours.  Glucose, capillary     Status: Abnormal   Collection Time:  06/16/20  8:35 PM  Result Value Ref Range   Glucose-Capillary 279 (H) 70 - 99 mg/dL    Comment: Glucose reference range applies only to samples taken after fasting for at least 8 hours.    Recent Results (from the past 240 hour(s))  Resp Panel by RT-PCR (Flu A&B, Covid) Nasopharyngeal Swab     Status: None   Collection Time: 06/09/20  9:45 PM   Specimen: Nasopharyngeal Swab; Nasopharyngeal(NP) swabs in vial transport medium  Result Value Ref Range Status   SARS Coronavirus 2 by RT PCR NEGATIVE NEGATIVE Final    Comment: (NOTE) SARS-CoV-2 target nucleic acids are NOT DETECTED.  The SARS-CoV-2 RNA is generally detectable in upper respiratory specimens during the acute phase of infection. The lowest concentration of SARS-CoV-2 viral copies this assay can detect is 138 copies/mL. A negative result does not preclude SARS-Cov-2 infection and should not be used as the sole basis for treatment or other patient management decisions. A negative result may occur with  improper specimen collection/handling, submission of specimen other than nasopharyngeal swab, presence of viral mutation(s) within the areas targeted by this assay, and inadequate number of viral copies(<138 copies/mL). A negative result must be combined with clinical observations, patient history, and epidemiological information. The expected result is Negative.  Fact Sheet for Patients:  EntrepreneurPulse.com.au  Fact Sheet for Healthcare Providers:  IncredibleEmployment.be  This test is no t yet approved or cleared by the [place] FDA and  has been authorized for detection and/or diagnosis of SARS-CoV-2 by FDA under an Emergency Use Authorization (EUA). This  EUA will remain  in effect (meaning this test can be used) for the duration of the COVID-19 declaration under Section 564(b)(1) of the Act, 21 U.S.C.section 360bbb-3(b)(1), unless the authorization is terminated  or revoked  sooner.       Influenza A by PCR NEGATIVE NEGATIVE Final   Influenza B by PCR NEGATIVE NEGATIVE Final    Comment: (NOTE) The Xpert Xpress SARS-CoV-2/FLU/RSV plus assay is intended as an aid in the diagnosis of influenza from Nasopharyngeal swab specimens and should not be used as a sole basis for treatment. Nasal washings and aspirates are unacceptable for Xpert Xpress SARS-CoV-2/FLU/RSV testing.  Fact Sheet for Patients: EntrepreneurPulse.com.au  Fact Sheet for Healthcare Providers: IncredibleEmployment.be  This test is not yet approved or cleared by the [place] FDA and has been authorized for detection and/or diagnosis of SARS-CoV-2 by FDA under an Emergency Use Authorization (EUA). This EUA will remain in effect (meaning this test can be used) for the duration of the COVID-19 declaration under Section 564(b)(1) of the Act, 21 U.S.C. section 360bbb-3(b)(1), unless the authorization is terminated or revoked.  Performed at [facility], [address] [place], [place] [address]   CULTURE, BLOOD (ROUTINE X 2) w Reflex to ID Panel     Status: Abnormal   Collection Time: 06/10/20  9:40 AM   Specimen: BLOOD  Result Value Ref Range Status   Specimen Description   Final    BLOOD BLOOD LEFT FOREARM Performed at [facility], [address] [place], [place] [address]    Special Requests   Final    BOTTLES DRAWN AEROBIC AND ANAEROBIC Blood Culture adequate volume Performed at [facility], [address] [place], [place] [address]    Culture  Setup Time   Final    Organism ID to follow IN BOTH AEROBIC AND ANAEROBIC BOTTLES GRAM POSITIVE COCCI CRITICAL RESULT CALLED TO, READ BACK BY AND VERIFIED WITH: Chimayo @1941  ON 06/10/20 SKL Performed at [facility], [address] [place], [place] [address]    Culture STREPTOCOCCUS PNEUMONIAE (A)  Final   Report Status 06/13/2020 FINAL  Final    Organism ID, Bacteria STREPTOCOCCUS PNEUMONIAE  Final      Susceptibility   Streptococcus pneumoniae - MIC*    ERYTHROMYCIN >=8 RESISTANT Resistant     LEVOFLOXACIN 0.5 SENSITIVE Sensitive     VANCOMYCIN 0.5 SENSITIVE Sensitive     PENICILLIN (meningitis) <=0.06 SENSITIVE Sensitive     PENO - penicillin <=0.06      PENICILLIN (non-meningitis) <=0.06 SENSITIVE Sensitive     PENICILLIN (oral) <=0.06 SENSITIVE Sensitive     CEFTRIAXONE (non-meningitis) <=0.12 SENSITIVE Sensitive     CEFTRIAXONE (meningitis) <=0.12 SENSITIVE Sensitive     * STREPTOCOCCUS PNEUMONIAE  Blood Culture ID Panel (Reflexed)     Status: Abnormal   Collection Time: 06/10/20  9:40 AM  Result Value Ref Range Status   Enterococcus faecalis NOT DETECTED NOT DETECTED Final   Enterococcus Faecium NOT DETECTED NOT DETECTED Final   Listeria monocytogenes NOT DETECTED NOT DETECTED Final   Staphylococcus species NOT DETECTED NOT DETECTED Final   Staphylococcus aureus (BCID) NOT DETECTED NOT DETECTED Final   Staphylococcus epidermidis NOT DETECTED NOT DETECTED Final   Staphylococcus lugdunensis NOT DETECTED NOT DETECTED Final   Streptococcus species DETECTED (A) NOT DETECTED Final    Comment: CRITICAL RESULT CALLED TO, READ BACK BY AND VERIFIED WITH: SUSAN WATSON @1941  ON 06/10/20 SKL    Streptococcus agalactiae NOT DETECTED NOT DETECTED  Final   Streptococcus pneumoniae DETECTED (A) NOT DETECTED Final    Comment: CRITICAL RESULT CALLED TO, READ BACK BY AND VERIFIED WITH: SUSAN WATSON @1941  ON 06/10/20 SKL    Streptococcus pyogenes NOT DETECTED NOT DETECTED Final   A.calcoaceticus-baumannii NOT DETECTED NOT DETECTED Final   Bacteroides fragilis NOT DETECTED NOT DETECTED Final   Enterobacterales NOT DETECTED NOT DETECTED Final   Enterobacter cloacae complex NOT DETECTED NOT DETECTED Final   Escherichia coli NOT DETECTED NOT DETECTED Final   Klebsiella aerogenes NOT DETECTED NOT DETECTED Final   Klebsiella oxytoca NOT  DETECTED NOT DETECTED Final   Klebsiella pneumoniae NOT DETECTED NOT DETECTED Final   Proteus species NOT DETECTED NOT DETECTED Final   Salmonella species NOT DETECTED NOT DETECTED Final   Serratia marcescens NOT DETECTED NOT DETECTED Final   Haemophilus influenzae NOT DETECTED NOT DETECTED Final   Neisseria meningitidis NOT DETECTED NOT DETECTED Final   Pseudomonas aeruginosa NOT DETECTED NOT DETECTED Final   Stenotrophomonas maltophilia NOT DETECTED NOT DETECTED Final   Candida albicans NOT DETECTED NOT DETECTED Final   Candida auris NOT DETECTED NOT DETECTED Final   Candida glabrata NOT DETECTED NOT DETECTED Final   Candida krusei NOT DETECTED NOT DETECTED Final   Candida parapsilosis NOT DETECTED NOT DETECTED Final   Candida tropicalis NOT DETECTED NOT DETECTED Final   Cryptococcus neoformans/gattii NOT DETECTED NOT DETECTED Final    Comment: Performed at [facility], [address] [place], [place] [address]  CULTURE, BLOOD (ROUTINE X 2) w Reflex to ID Panel     Status: None   Collection Time: 06/10/20 11:26 AM   Specimen: BLOOD  Result Value Ref Range Status   Specimen Description BLOOD LEFT ARM  Final   Special Requests   Final    BOTTLES DRAWN AEROBIC ONLY Blood Culture adequate volume   Culture   Final    NO GROWTH 5 DAYS Performed at [facility], [address] [place], [place] [address]    Report Status 06/15/2020 FINAL  Final  CSF culture w Stat Gram Stain     Status: None   Collection Time: 06/10/20  2:19 PM   Specimen: CSF; Cerebrospinal Fluid  Result Value Ref Range Status   Specimen Description   Final    CSF Performed at [facility], [address] [place], [place] [address]    Special Requests   Final    NONE Performed at [facility], [address] [place], [place] [address]    Gram Stain   Final    GRAM POSITIVE DIPLOCOCCI CRITICAL RESULT CALLED TO, READ BACK BY AND VERIFIED WITH: KORY ANDREWS @1627  06/10/20  MJU WBC SEEN RBC SEEN Performed at [facility], [address] [place],  [address]    Culture MODERATE STREPTOCOCCUS PNEUMONIAE  Final   Report Status 06/12/2020 FINAL  Final   Organism ID, Bacteria STREPTOCOCCUS PNEUMONIAE  Final      Susceptibility   Streptococcus pneumoniae - MIC*    ERYTHROMYCIN >=8 RESISTANT Resistant     LEVOFLOXACIN 1 SENSITIVE Sensitive     VANCOMYCIN 0.25 SENSITIVE Sensitive     PENICILLIN (meningitis) <=0.06 SENSITIVE Sensitive     PENO - penicillin <=0.06      PENICILLIN (non-meningitis) <=0.06 SENSITIVE Sensitive     PENICILLIN (oral) <=0.06 SENSITIVE Sensitive     CEFTRIAXONE (non-meningitis) <=0.12 SENSITIVE Sensitive     CEFTRIAXONE (meningitis) <=0.12 SENSITIVE Sensitive     * MODERATE STREPTOCOCCUS PNEUMONIAE  MRSA  PCR Screening     Status: None   Collection Time: 06/11/20  4:56 AM   Specimen: Nasopharyngeal  Result Value Ref Range Status   MRSA by PCR NEGATIVE NEGATIVE Final    Comment:        The GeneXpert MRSA Assay (FDA approved for NASAL specimens only), is one component of a comprehensive MRSA colonization surveillance program. It is not intended to diagnose MRSA infection nor to guide or monitor treatment for MRSA infections. Performed at [facility], [address] [place], [place] [address]   Culture, blood (Routine X 2) w Reflex to ID Panel     Status: None (Preliminary result)   Collection Time: 06/12/20 12:41 AM   Specimen: BLOOD  Result Value Ref Range Status   Specimen Description BLOOD BLOOD RIGHT HAND  Final   Special Requests   Final    BOTTLES DRAWN AEROBIC AND ANAEROBIC Blood Culture adequate volume   Culture   Final    NO GROWTH 4 DAYS Performed at [facility], [address], [place], [place] [address]    Report Status PENDING  Incomplete  Culture, blood (Routine X 2) w Reflex to ID Panel     Status: None (Preliminary result)   Collection Time: 06/12/20 12:41 AM    Specimen: BLOOD  Result Value Ref Range Status   Specimen Description BLOOD BLOOD LEFT HAND  Final   Special Requests IN PEDIATRIC BOTTLE Blood Culture adequate volume  Final   Culture   Final    NO GROWTH 4 DAYS Performed at [facility], [address], [place], [place] [address]    Report Status PENDING  Incomplete    Lipid Panel No results for input(s): CHOL, TRIG, HDL, CHOLHDL, VLDL, LDLCALC in the last 72 hours.  Studies/Results: MR LUMBAR SPINE WO CONTRAST  Result Date: 06/16/2020 CLINICAL DATA:  Pneumococcal meningitis. Follow-up lumbar spine infection. New fever today. EXAM: MRI LUMBAR SPINE WITHOUT CONTRAST TECHNIQUE: Multiplanar, multisequence MR imaging of the lumbar spine was performed. No intravenous contrast was administered. COMPARISON:  Lumbar MRI 06/11/2020 FINDINGS: Segmentation:  Normal Alignment:  Normal Vertebrae: No evidence of discitis. No fracture or bone marrow edema. Conus medullaris and cauda equina: Conus extends to the L1-2 level. Conus and cauda equina appear normal. Paraspinal and other soft tissues: Posterior paraspinous muscle edema and multiple abscesses have progressed in the interval. Fluid collections are present in the muscles which appear to be associated with the facet joints on the left at L2-3 and L3-4. Abscesses appears slightly larger with increased surrounding edema. No right-sided abscess identified. Small epidural abscess posterior on the left at L2-3 is unchanged. Disc levels: L1-2: Negative L2-3: Shallow central disc protrusion with mild spinal stenosis. Fluid in the left facet joint which is contiguous with a posterior paraspinous muscle abscess. Small posterior epidural abscess on the left unchanged. L3-4: Shallow ventral epidural fluid collection has progressed compatible with abscess. Bilateral facet degeneration. Fluid in the left facet joint appears contiguous with the posterior paraspinous muscle abscess. No significant spinal  stenosis L4-5: Shallow central disc protrusion. Bilateral facet degeneration. Mild subarticular stenosis bilaterally L5-S1: Moderate disc degeneration with disc space narrowing. Shallow central and left-sided disc protrusion unchanged. Possible left S1 nerve root impingement. Bilateral mild facet degeneration. IMPRESSION: Findings compatible with lumbar spinal infection. There appears to be septic arthritis in the left L2-3 and L3-4 facets with adjacent paraspinous abscesses. Paraspinous abscesses and muscle edema appear mildly progressive. Ventral epidural abscess at L3-4 is small but  appears mildly progressive. Small posterior epidural abscess on the left at L2-3 is unchanged. Electronically Signed   By: Franchot Gallo M.D.   On: 06/16/2020 16:06   DG Chest Port 1 View  Result Date: 06/16/2020 CLINICAL DATA:  Fever. EXAM: PORTABLE CHEST 1 VIEW COMPARISON:  06/09/2020 FINDINGS: The cardiomediastinal silhouette is within normal limits for portable AP technique. Lung volumes are low, and there are new mild opacities in both lung bases. No sizable pleural effusion or pneumothorax is identified. A new right PICC terminates over the SVC. IMPRESSION: Low lung volumes with mild bibasilar opacities favoring atelectasis although pneumonia is not excluded. Electronically Signed   By: Logan Bores M.D.   On: 06/16/2020 13:43    Medications:  Scheduled: . amLODipine  5 mg Oral Daily  . chlorhexidine  15 mL Mouth Rinse BID  . Chlorhexidine Gluconate Cloth  6 each Topical Q0600  . enoxaparin (LOVENOX) injection  40 mg Subcutaneous Q24H  . gabapentin  200 mg Oral BID  . hydrochlorothiazide  12.5 mg Oral Daily  . insulin aspart  0-5 Units Subcutaneous QHS  . insulin aspart  0-9 Units Subcutaneous TID WC  . [START ON 06/17/2020] insulin glargine  24 Units Subcutaneous Daily  . levothyroxine  150 mcg Oral Q0600  . losartan  100 mg Oral Daily  . magnesium oxide  400 mg Oral Daily  . mouth rinse  15 mL Mouth Rinse  [id number]  . pantoprazole (PROTONIX) IV  40 mg Intravenous Q24H  . potassium chloride  20 mEq Oral Daily  . sodium chloride flush  10-40 mL Intracatheter Q12H  . [START ON 06/19/2020] thiamine injection  100 mg Intravenous Daily  . traZODone  50 mg Oral QHS   Continuous: . cefTRIAXone (ROCEPHIN)  IV 2 g (06/16/20 1802)  . sodium chloride 0.9 % 25 mL with thiamine (B-1) 250 mg infusion 50 mL/hr at 06/16/20 1035   Assessment: Maanasa Aderhold is a 55 y.o. 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 LP performed on 2/15 confirmed meningitis. EEG on 2/16 showed findings suggestive of moderate diffuse encephalopathy, with no seizures or epileptiform discharges seen. On Sunday (yesterday), Neuro exam was with significant improvement in her encephalopathy; at that time she still reported difficulty concentrating but was able to engage in a conversation and follow commands. AM exam per Hospitalist today (Monday) revealed new inability to straight-leg raise. Follow up Neurological exam this evening is worsened but received sedation for repeat MRI.  - MR Brain from 2/16 with no acute abnormality.  - MRI of C-T-L spine was consistent with septic arthritis of lumbar spine with associated paraspinal muscle abscess, albeit small.  - Repeat MRI of lumbar spine performed today (2/21) reveals findings compatible with lumbar spinal infection. There appears to be septic arthritis in the left L2-3 and L3-4 facets with adjacent paraspinous abscesses. Paraspinous abscesses and muscle edema appear mildly progressive. Ventral epidural abscess at L3-4 is small but also appears mildly progressive. Small posterior epidural abscess on the left at L2-3 is unchanged. - Overall picture consistent with meningitis and septic arthritis/paraspinal muscle abscess of the lumbar spine. Not floridly myelopathic on exam and there continues to be no  imaging evidence of cord compression.  - CSF culture (sample obtained 2/15): STREPTOCOCCUS PNEUMONIAE  - Her apparent worsened BLE weakness on AM exam today may be effort dependent as no compressive lesion is seen on spine MRI, but also could be due  to a process more proximally in her cervical or thoracic spine. She does endorse pain in her lower extremities as being a limiting factor, but is unable to further elaborate.  - Exam shows retained sensation and only mild weakness of ADF and APF, therefore a compressive cord lesion is unlikely. A cauda equina syndrome is not apparent on repeat imaging. Difficult to localize findings given inconsistencies on exam, therefore further imaging will be necessary.  - Per Neurosurgery, there was no utility of draining the facet infection. Recommendation was to continue ABX at that time (2/17)  Recommendations: - Given repeat L-spine MRI with evidence for mild progression of paraspinous abscesses and muscle edema will need ID to provide further recommendations regarding ABX.  - Also given that the L-spine MRI does not show any new compressive lesion of the conus medullaris or cauda equina, will need to obtain repeat imaging of cervical and thoracic spine (ordered) - Frequent neuro checks.  - Limit sedating meds - Neurology will continue to follow.    LOS: 7 days   @Electronically  signed: Dr. Kerney Elbe 06/16/2020  10:31 PM

## 2020-06-16 NOTE — Progress Notes (Signed)
Date of Admission:  06/09/2020       ID: Tonya Myers is a 55 y.o. female  Principal Problem:   Acute encephalopathy Active Problems:   CLL (chronic lymphocytic leukemia) (HCC)   Hypothyroidism   Essential hypertension   Uncontrolled type 2 diabetes mellitus with hyperglycemia, without long-term current use of insulin (HCC)   Aphasia   Hyperglycemic crisis in diabetes mellitus ([place])   Acute metabolic encephalopathy   Severe sepsis (HCC)   Pneumococcal meningitis   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HCC)   Epidural abscess   Hypernatremia   Hypomagnesemia    Subjective: Patient had fever today Went for MRI Sedated with Ativan As per daughter at bedside she was talking to her this morning.  Medications:  . amLODipine  5 mg Oral Daily  . chlorhexidine  15 mL Mouth Rinse BID  . Chlorhexidine Gluconate Cloth  6 each Topical Q0600  . enoxaparin (LOVENOX) injection  40 mg Subcutaneous Q24H  . gabapentin  200 mg Oral BID  . hydrochlorothiazide  12.5 mg Oral Daily  . insulin aspart  0-5 Units Subcutaneous QHS  . insulin aspart  0-9 Units Subcutaneous TID WC  . [START ON 06/17/2020] insulin glargine  24 Units Subcutaneous Daily  . levothyroxine  150 mcg Oral Q0600  . losartan  100 mg Oral Daily  . magnesium oxide  400 mg Oral Daily  . mouth rinse  15 mL Mouth Rinse q12n4p  . pantoprazole (PROTONIX) IV  40 mg Intravenous Q24H  . potassium chloride  20 mEq Oral Daily  . sodium chloride flush  10-40 mL Intracatheter Q12H  . [START ON 06/19/2020] thiamine injection  100 mg Intravenous Daily  . traZODone  50 mg Oral QHS    Objective: Vital signs in last 24 hours: Patient Vitals for the past 24 hrs:  BP Temp Temp src Pulse Resp SpO2 Weight  06/16/20 1240 (!) 174/96 (!) 101.2 F (38.4 C) -- 81 16 95 % --  06/16/20 1054 (!) 189/95 99 F (37.2 C) -- 75 16 97 % --  06/16/20 0949 (!) 189/100 97.8 F (36.6 C) -- (!) 106 20 -- --  06/16/20 0758 (!) 217/86 97.8 F (36.6 C) -- 72 16 94 % --   06/16/20 0642 (!) 185/87 -- -- 67 -- -- --  06/16/20 0425 (!) (P) 174/96 98.3 F (36.8 C) -- 64 18 94 % (P) 110.1 kg  06/16/20 0003 (!) 171/85 98.1 F (36.7 C) -- 80 18 99 % --  06/15/20 1925 (!) 176/101 98.2 F (36.8 C) Oral 99 20 92 % --  06/15/20 1540 (!) 173/105 99 F (37.2 C) Oral 96 15 100 % --   Temp:  [97.8 F (36.6 C)-101.2 F (38.4 C)] 101.2 F (38.4 C) (02/21 1240) Pulse Rate:  [64-106] 81 (02/21 1240) Resp:  [15-20] 16 (02/21 1240) BP: (171-217)/(85-105) 174/96 (02/21 1240) SpO2:  [92 %-100 %] 95 % (02/21 1240) Weight:  [110.1 kg] (P) 110.1 kg (02/21 0425)  PHYSICAL EXAM:  General: Sedated and sleeping  Lungs: Bilateral air entry Heart: S1-S2 abdomen: Soft, non-tender,not distended. Bowel sounds normal. No masses Foley catheter Extremities: Edematous Right PICC Skin: No rashes or lesions. Or bruising Lymph: Cervical, supraclavicular normal. Neurologic: Cannot be assessed Lab Results Recent Labs    06/14/20 0446 06/15/20 0425 06/16/20 0502  WBC 12.5*  --  11.7*  HGB 8.2*  --  8.7*  HCT 25.4*  --  28.1*  NA 153* 152* 139  K 4.0  --  3.6  CL 122*  --  109  CO2 23  --  23  BUN 41*  --  19  CREATININE 1.12*  --  0.86    Microbiology: 06/10/2020 CSF culture Streptococcus pneumonia 05/29/2020 blood culture Streptococcus pneumoniae 06/12/2020 blood culture no growth Studies/Results: MRI Assessment/Plan:  Streptococcus pneumonia bacteremia Streptococcus pneumonia meningitis On ceftriaxone 2 g IV every 12  Small epidural abscess L2-L3 and L3-L4 septic arthritis of the facet joint associated with paraspinal abscess. Patient has a new fever today.  We will repeat the MRI.  To see whether the paraspinal abscess has increased in size.  Will need drainage.  AKI secondary to the infection better  Diabetes mellitus as per management of the primary team  Chronic lymphocytic leukemia  Discussed the management with the care team and the daughter.

## 2020-06-17 ENCOUNTER — Inpatient Hospital Stay: Payer: Medicare Other | Admitting: Certified Registered Nurse Anesthetist

## 2020-06-17 ENCOUNTER — Inpatient Hospital Stay: Payer: Medicare Other

## 2020-06-17 ENCOUNTER — Encounter: Payer: Self-pay | Admitting: Internal Medicine

## 2020-06-17 ENCOUNTER — Encounter
Admission: EM | Disposition: A | Discharge status: Rehabilitation Facility | Payer: Self-pay | Source: Home / Self Care | Attending: Internal Medicine

## 2020-06-17 DIAGNOSIS — G8220 Paraplegia, unspecified: Secondary | ICD-10-CM | POA: Diagnosis not present

## 2020-06-17 DIAGNOSIS — A419 Sepsis, unspecified organism: Secondary | ICD-10-CM | POA: Diagnosis not present

## 2020-06-17 DIAGNOSIS — G062 Extradural and subdural abscess, unspecified: Secondary | ICD-10-CM | POA: Diagnosis not present

## 2020-06-17 DIAGNOSIS — G9340 Encephalopathy, unspecified: Secondary | ICD-10-CM | POA: Diagnosis not present

## 2020-06-17 DIAGNOSIS — G001 Pneumococcal meningitis: Secondary | ICD-10-CM | POA: Diagnosis not present

## 2020-06-17 DIAGNOSIS — M4620 Osteomyelitis of vertebra, site unspecified: Secondary | ICD-10-CM | POA: Diagnosis not present

## 2020-06-17 HISTORY — PX: THORACIC LAMINECTOMY FOR EPIDURAL ABSCESS: SHX6115

## 2020-06-17 LAB — BASIC METABOLIC PANEL
Anion gap: 7 (ref 5–15)
BUN: 15 mg/dL (ref 6–20)
CO2: 26 mmol/L (ref 22–32)
Calcium: 8.3 mg/dL — ABNORMAL LOW (ref 8.9–10.3)
Chloride: 104 mmol/L (ref 98–111)
Creatinine, Ser: 0.93 mg/dL (ref 0.44–1.00)
GFR, Estimated: >60 mL/min (ref >60)
Glucose, Bld: 276 mg/dL — ABNORMAL HIGH (ref 70–99)
Potassium: 3.4 mmol/L — ABNORMAL LOW (ref 3.5–5.1)
Sodium: 137 mmol/L (ref 135–145)

## 2020-06-17 LAB — CBC
HCT: 27.1 % — ABNORMAL LOW (ref 36.0–46.0)
Hemoglobin: 8.6 g/dL — ABNORMAL LOW (ref 12.0–15.0)
MCH: 25.7 pg — ABNORMAL LOW (ref 26.0–34.0)
MCHC: 31.7 g/dL (ref 30.0–36.0)
MCV: 81.1 fL (ref 80.0–100.0)
Platelets: 257 10*3/uL (ref 150–400)
RBC: 3.34 MIL/uL — ABNORMAL LOW (ref 3.87–5.11)
RDW: 17.2 % — ABNORMAL HIGH (ref 11.5–15.5)
WBC: 11 10*3/uL — ABNORMAL HIGH (ref 4.0–10.5)
nRBC: 0 % (ref 0.0–0.2)

## 2020-06-17 LAB — PHOSPHORUS: Phosphorus: 2.7 mg/dL (ref 2.5–4.6)

## 2020-06-17 LAB — CULTURE, BLOOD (ROUTINE X 2)
Culture: NO GROWTH
Culture: NO GROWTH
Special Requests: ADEQUATE
Special Requests: ADEQUATE

## 2020-06-17 LAB — GLUCOSE, CAPILLARY
Glucose-Capillary: 240 mg/dL — ABNORMAL HIGH (ref 70–99)
Glucose-Capillary: 293 mg/dL — ABNORMAL HIGH (ref 70–99)
Glucose-Capillary: 269 mg/dL — ABNORMAL HIGH (ref 70–99)
Glucose-Capillary: 247 mg/dL — ABNORMAL HIGH (ref 70–99)

## 2020-06-17 LAB — MAGNESIUM: Magnesium: 1.7 mg/dL (ref 1.7–2.4)

## 2020-06-17 IMAGING — MR MR THORACIC SPINE W/O CM
6 of 7 series · 27 of 48 positions shown · non-contrast
Comparison: MRI [DATE].

CLINICAL DATA: Septic arthritis

EXAM:
MRI CERVICAL AND THORACIC SPINE WITHOUT CONTRAST
TECHNIQUE: Multiplanar and multiecho pulse sequences of the cervical spine, to
include the craniocervical junction and cervicothoracic junction,
and the thoracic spine, were obtained without intravenous contrast.

[Series 16: T1 · sagittal · 5.0mm · 1.88mm/px · 3 of 9 slices shown (1 of 2)]
[im 1/9]
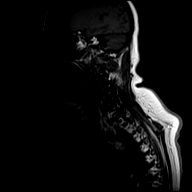
[im 5/9]
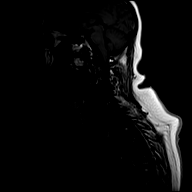
[im 9/9]
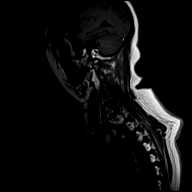

[Series 18: T2 · sagittal · 3.0mm · 1.06mm/px · 5 of 19 slices shown (1 of 2)]
[im 1/19]
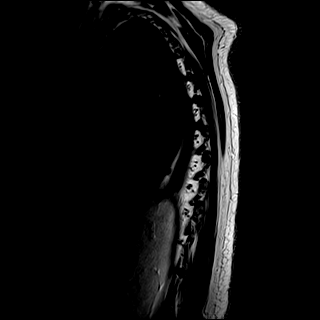
[im 5/19]
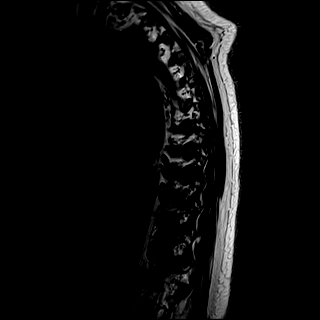
[im 10/19]
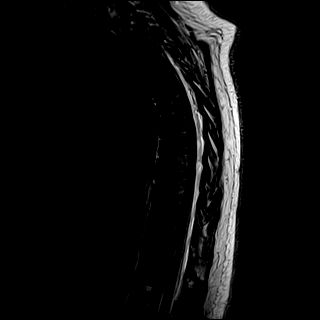
[im 14/19]
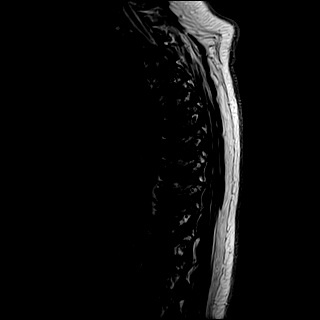
[im 19/19]
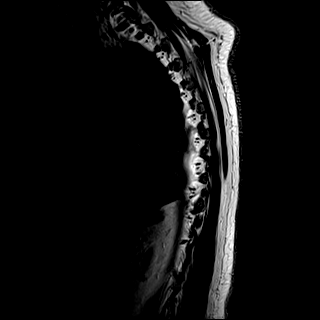

[Series 19: T1 · sagittal · 3.0mm · 1.06mm/px · 5 of 19 slices shown (2 of 2)]
[im 1/19]
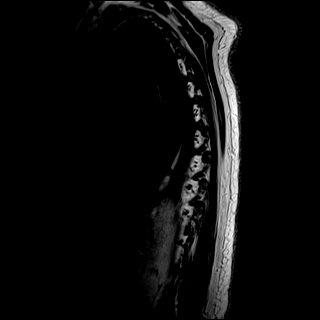
[im 5/19]
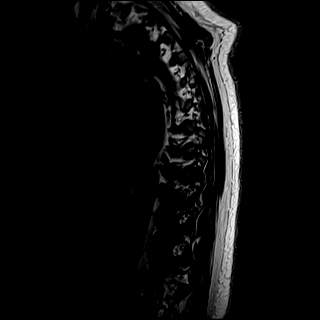
[im 10/19]
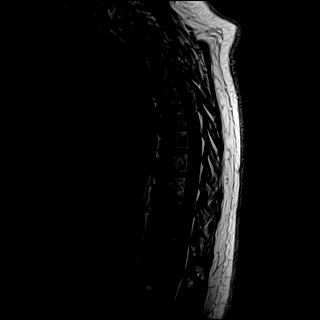
[im 14/19]
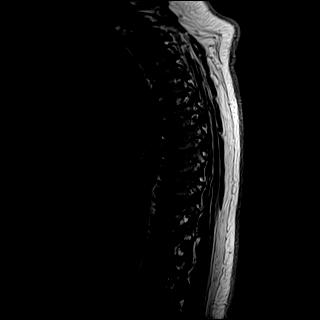
[im 19/19]
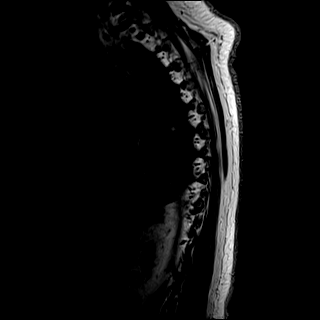

[Series 20: STIR · sagittal · 3.0mm · 0.53mm/px · 5 of 19 slices shown]
[im 1/19]
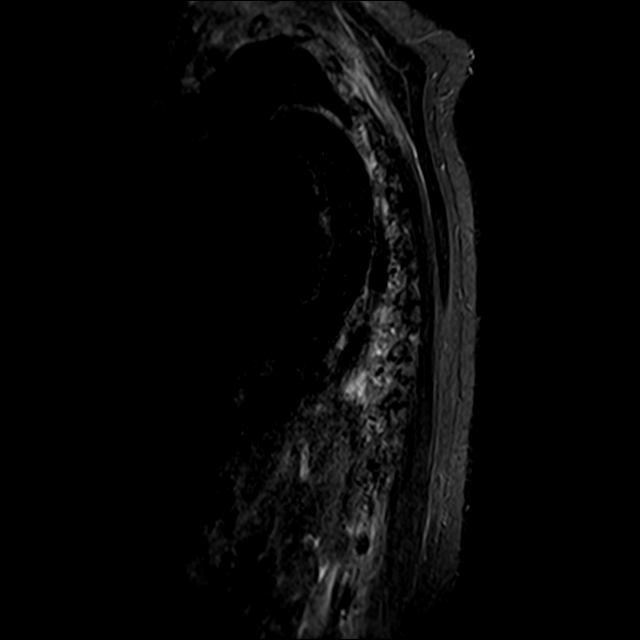
[im 5/19]
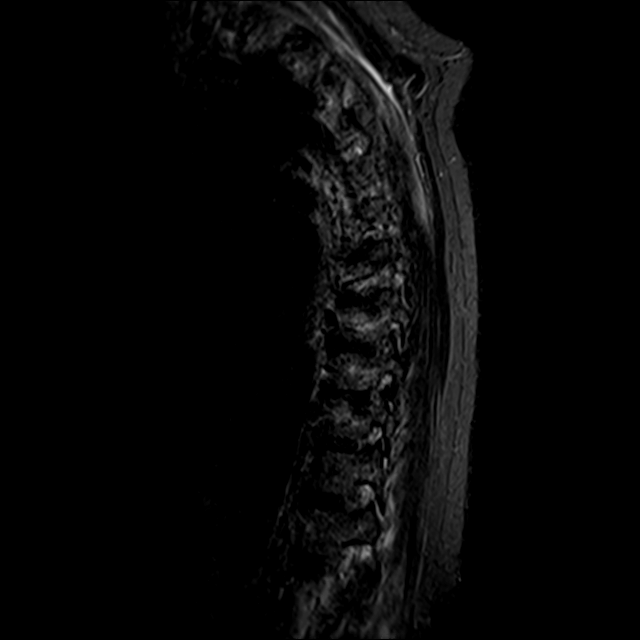
[im 10/19]
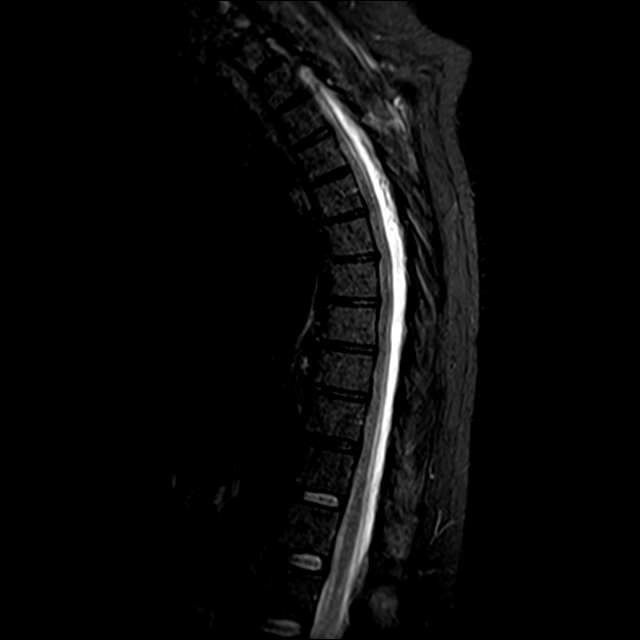
[im 14/19]
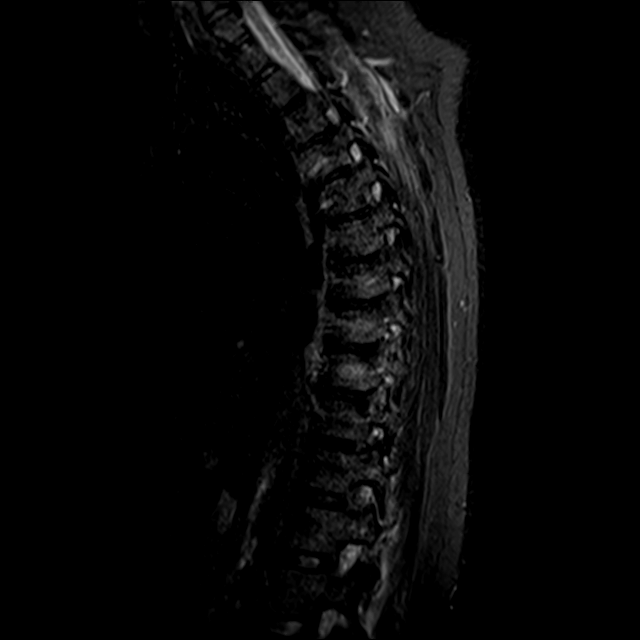
[im 19/19]
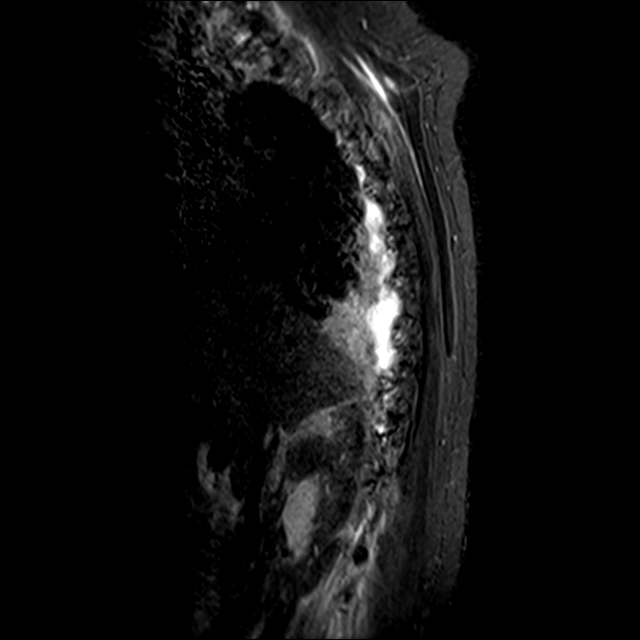

[Series 21: T2 · axial · 4.0mm · 0.59mm/px · z∈[-317,-101]mm · 8 of 39 slices shown (2 of 2)]
[im 1/39]
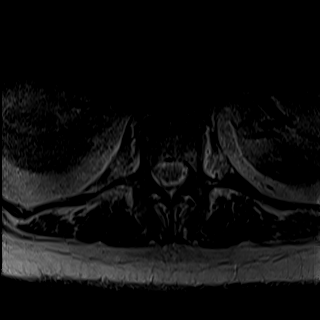
[im 5/39]
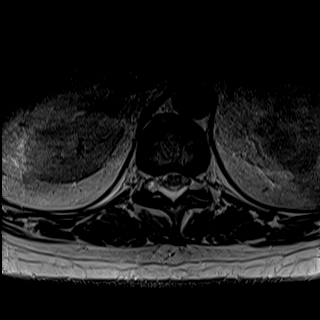
[im 13/39]
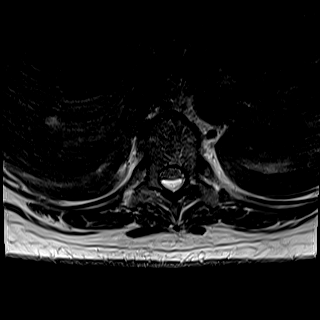
[im 17/39]
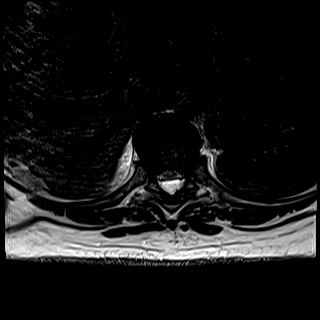
[im 22/39]
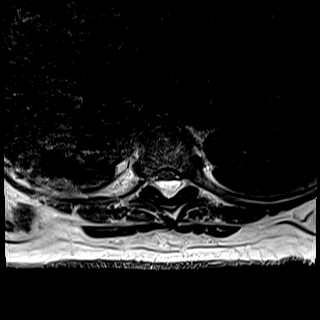
[im 26/39]
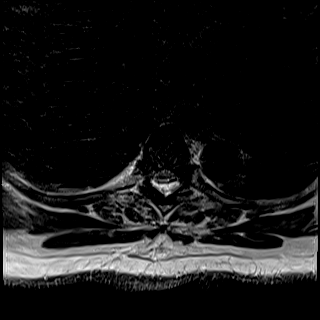
[im 34/39]
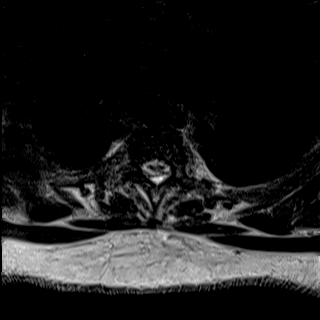
[im 39/39]
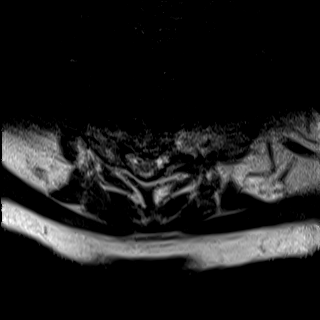

[Series 23: T1 post-contrast · axial · non-contrast · 4.0mm · 0.37mm/px · 1 of 39 slices shown]
[im 1/39]
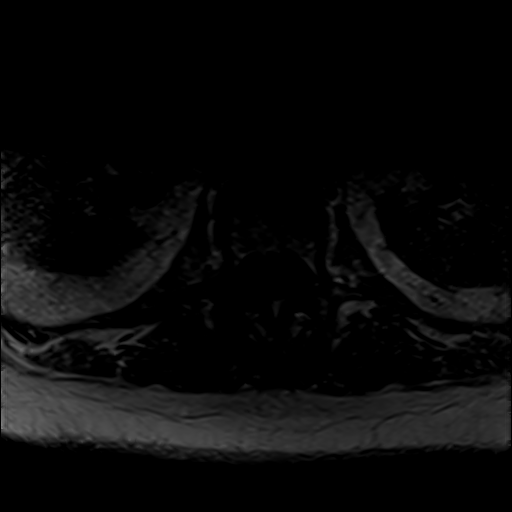

[27 of 48 positions shown; findings below may reference images not displayed]

FINDINGS: MRI CERVICAL SPINE FINDINGS

Motion limited examination.

Alignment: Normal.

Vertebrae: Vertebral body heights are maintained. No focal marrow
signal or disc signal abnormality to suggest discitis/osteomyelitis.

Canal/Cord: Significantly limited evaluation given motion without
definite cord signal abnormality or canal fluid collection.

Posterior Fossa, vertebral arteries, paraspinal tissues: There is
edema in the posterior paraspinal soft tissues in the upper cervical
spine. No evidence of discrete fluid collection.

Disc levels:

C2-C3: Posterior disc osteophyte complex with mass effect on the
ventral thecal sac. No significant foraminal stenosis.

C3-C4: Left eccentric posterior disc osteophyte complex with mass
effect on the thecal sac and deformity of the ventral cord with at
least moderate canal stenosis. Bilateral facet and uncovertebral
hypertrophy. Evaluation of the foramina is limited with possibly
moderate bilateral foraminal stenosis

C4-C5: Posterior disc osteophyte complex with mass effect on the
thecal sac and suspected moderate canal stenosis. Bilateral facet
and uncovertebral hypertrophy. Evaluation of foramina is limited
with suspected moderate bilateral foraminal stenosis.

C5-C6: Posterior disc osteophyte complex with disc contacting the
ventral cord and suspected moderate canal stenosis. Evaluation of
the foramina is limited with possibly severe bilateral foraminal
stenosis

C6-C7: Posterior disc osteophyte complex with bilateral facet and
uncovertebral hypertrophy.

C7-T1: No significant disc protrusion, foraminal stenosis, or canal
stenosis.

MRI THORACIC SPINE FINDINGS

Alignment:  Normal.

Vertebrae: Vertebral body heights are maintained. No focal marrow
edema to suggest osteomyelitis.

Canal/Cord: There is a large fluid collection within the dorsal
epidural space spanning the entire thoracic spine (C7-T1 to T12-L1),
measuring up to 1.0 x 1.5 cm at T7-T8. The collection appears
increased in size to the prior with increased effacement of CSF
around the cord. The epidural fluid collection along with a
posterior disc osteophyte at T6-T7 results in severe canal stenosis
at this level with near complete effacement of CSF. There is mild
canal stenosis at T2-T3, T3-T4, T10-T11 and T11-T12 and moderate
canal stenosis at T4-T5, T5-T6, T7-T8, T8-T9, T9-T10. No evidence of
abnormal cord signal.

Paraspinal and other soft tissues: Edema in the posterior paraspinal
soft tissues in the upper thoracic spine and lower thoracic spine.
Nonspecific T2 hyperintense hepatic lesion, not well characterized
on this study. Bibasilar lung opacities, better characterized on
recent chest radiograph.

Disc levels:

Canal stenosis is detailed above. Suspected moderate left foraminal
stenosis at T8-T9.
IMPRESSION: Motion limited study.

1. Large dorsal epidural fluid collection spanning the entirety of
the thoracic spine, increased in size relative to [DATE] MRI.
The collection along with degenerative change results in severe
canal stenosis at T6-T7 with near complete effacement of CSF and
multilevel moderate canal stenosis in the thoracic spine, progressed
since the prior and detailed above. While nonspecific by imaging,
findings are concerning for epidural abscess given the patient's
clinical history. Postcontrast imaging could provide more complete
evaluation if clinically indicated.
2. Edema within the paraspinal soft tissues in the upper cervical
and thoracic spine and lower thoracic spine, concerning for
infection/cellulitis given the clinical history. No discrete
drainable fluid collection identified in the paraspinal soft tissues
of the cervical or thoracic spine identified, although evaluation is
limited by motion and absence of IV contrast. Please see separately
dictated lumbar spine for infectious findings in the lumbar spine.
3. Evaluation of degenerative change in the cervical spine is
significantly limited given motion. Multilevel suspected at least
moderate canal stenosis, worst on the left at C3-C4 where posterior
disc osteophyte contacts and deforms the left cord with effacement
of the left root entry zone. Evaluation of the foramina is
particularly limited, but there is suspected at least moderate
multilevel foraminal stenosis in the cervical spine and on the left
at T8-T9. Repeat MRI (possibly with sedation) could further
characterize if clinically indicated.

Critical findings discussed with Dr. ILLIMANI via telephone at [DATE].

## 2020-06-17 IMAGING — RF DG C-ARM 1-60 MIN-NO REPORT
1 series · 3 of 3 positions shown · non-contrast
Comparison: MR [DATE]

CLINICAL DATA: Bilateral thoracic laminectomy for epidural abscess

EXAM:
THORACIC SPINE 2 VIEWS; DG C-ARM 1-60 MIN-NO REPORT

[Series 1: dg no report - auto finalize · 0.20mm/px · 3 of 3 slices shown]
[im 1/3]
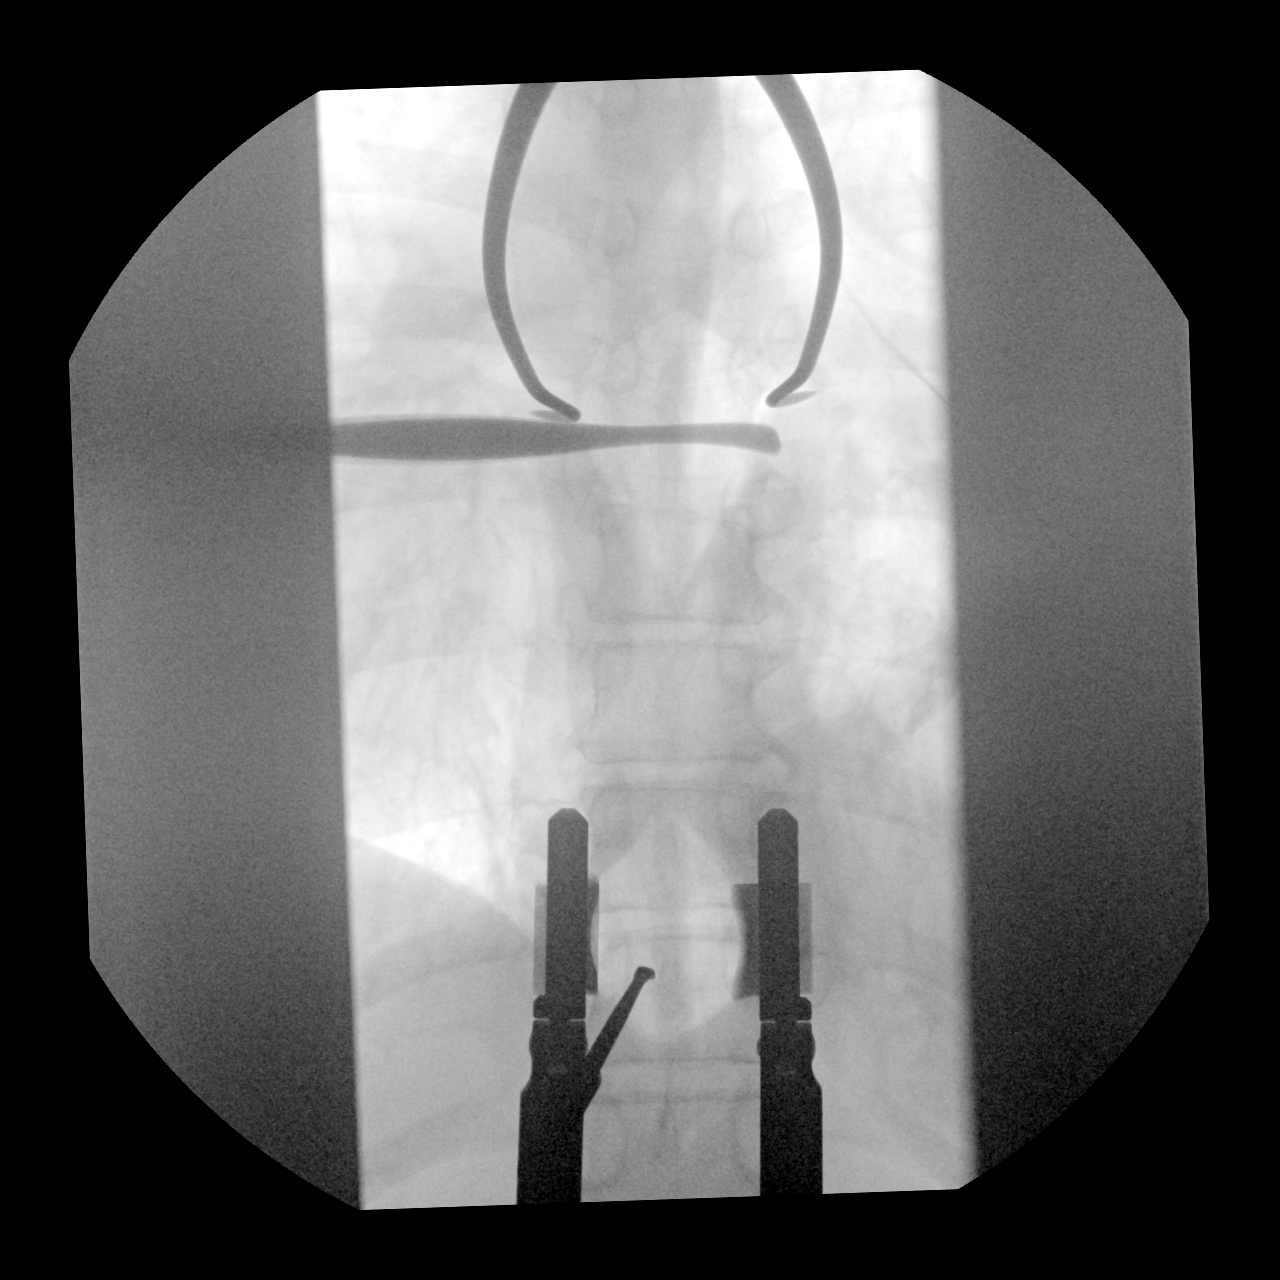
[im 2/3]
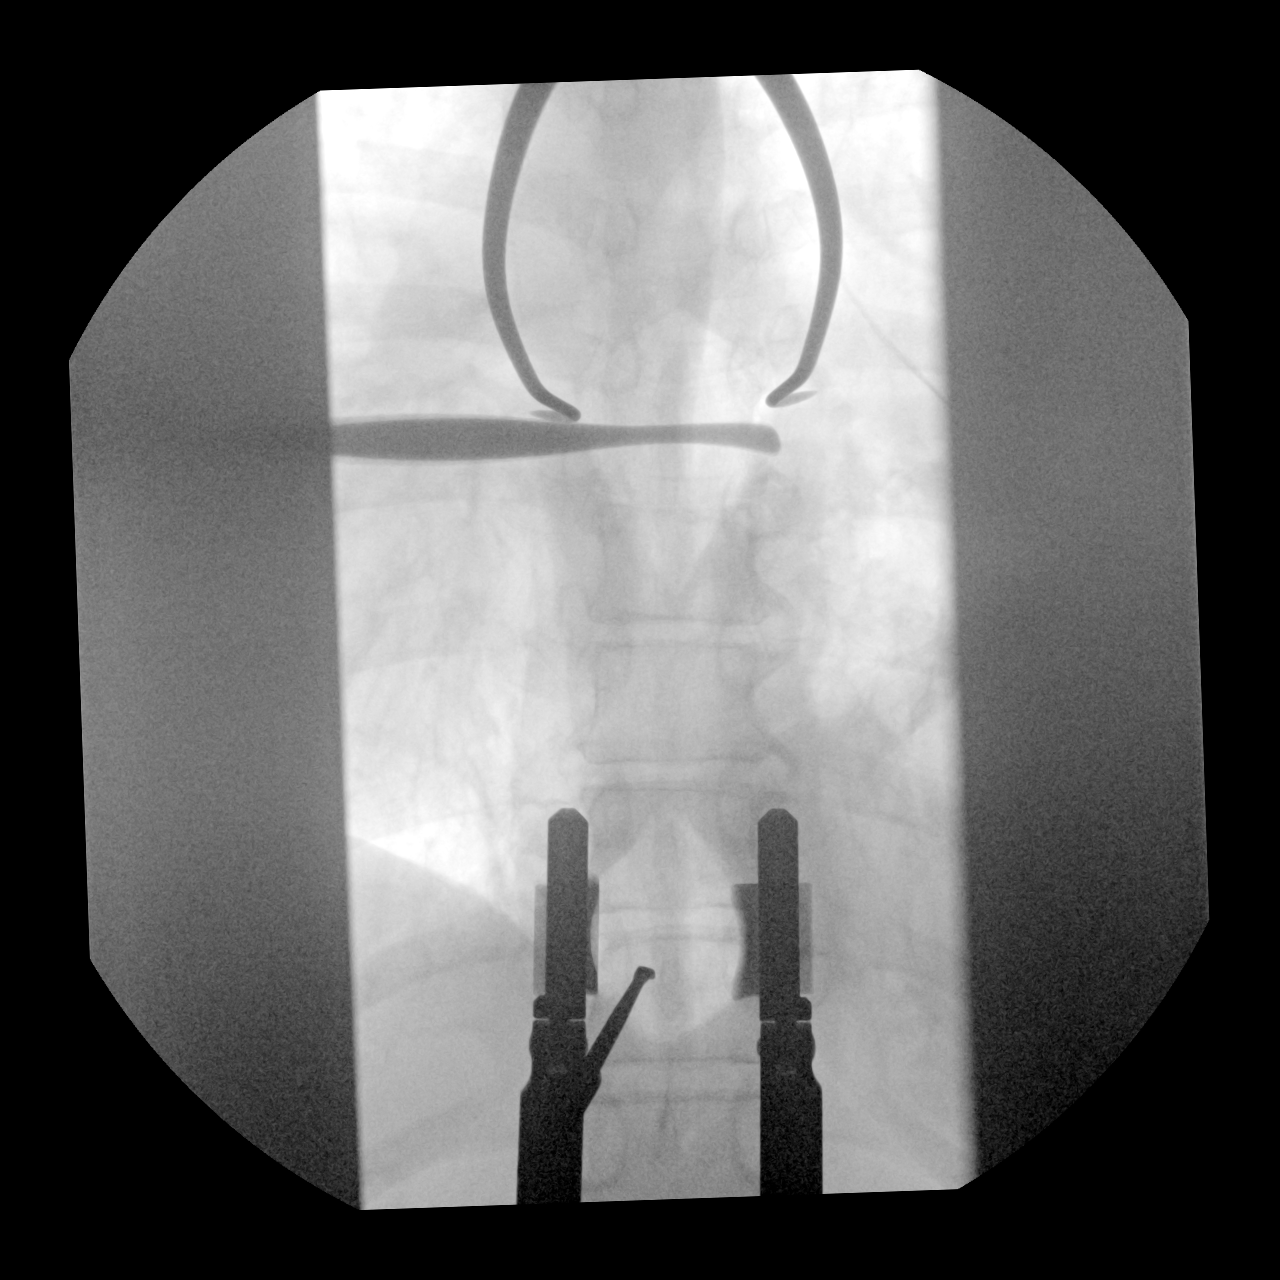
[im 3/3]
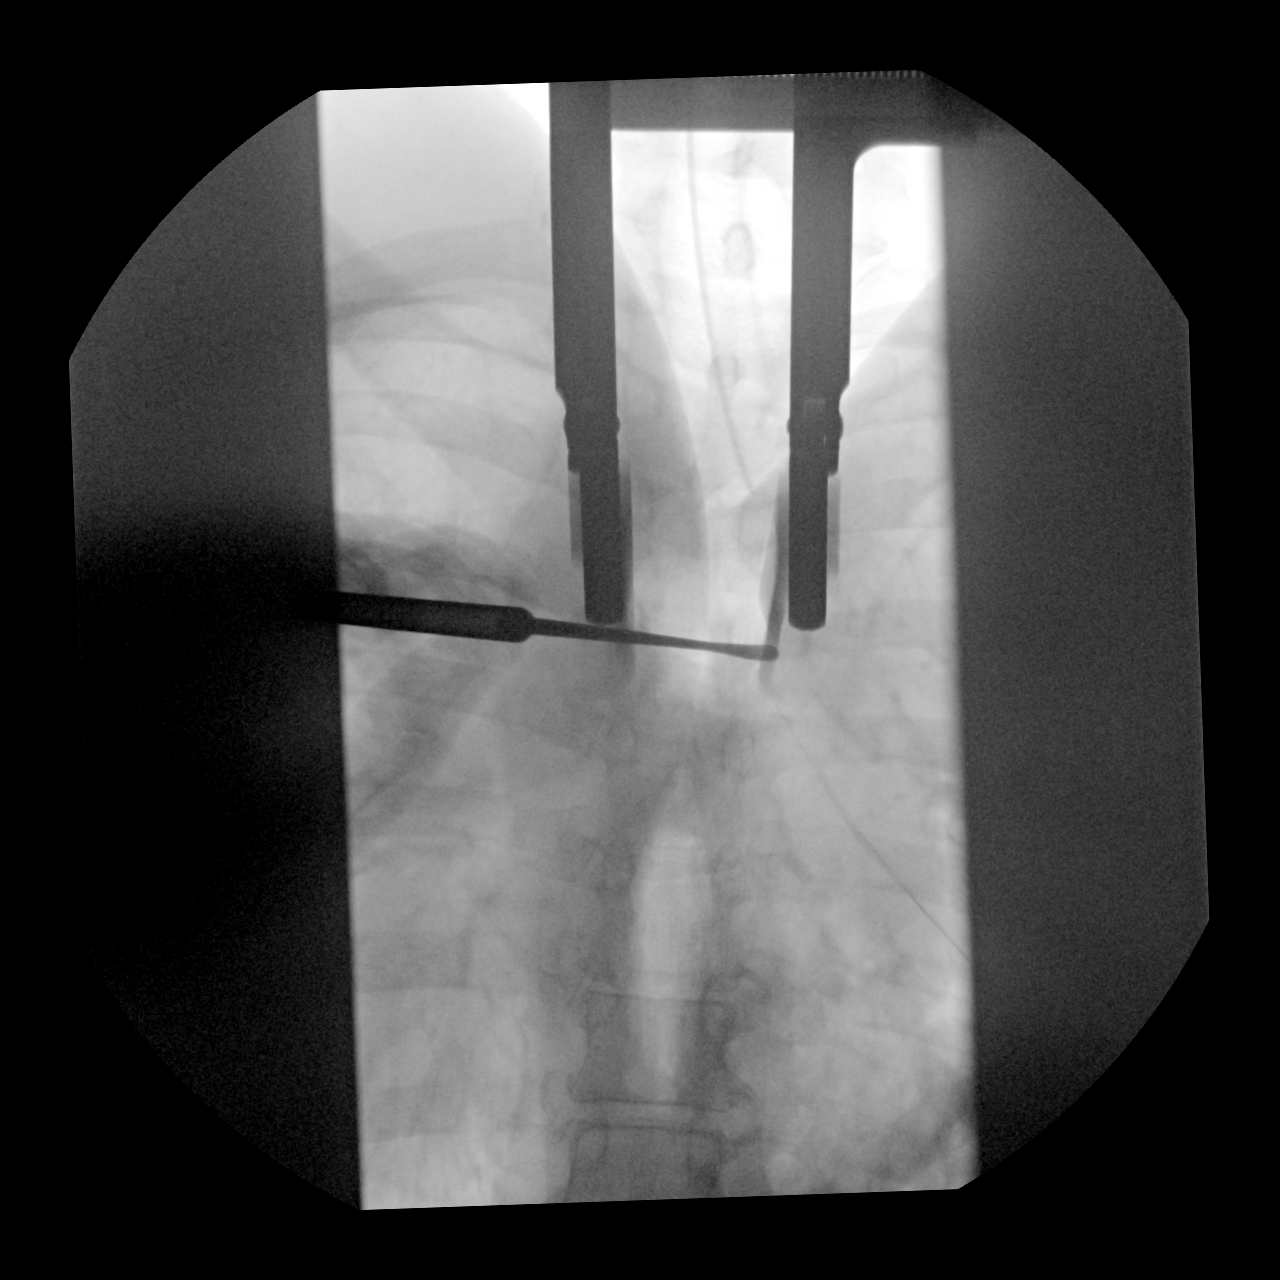

[3 of 3 positions shown; findings below may reference images not displayed]

FINDINGS: Intraoperative fluoroscopy was obtained during the process of a
thoracic laminectomy for evacuation of an epidural abscess. Image #5
demonstrates a blunt surgical implement projects over the T4
vertebral level on the frontal radiograph. Surgical retractors seen
posteriorly.

Images labeled as #3,4 are more difficult to localize given the
absence of localizing landmarks. Recommend correlation with
operative report.
IMPRESSION: Intraoperative fluoroscopy obtained during the process of a thoracic
laminectomy for evacuation of an epidural abscess.

## 2020-06-17 IMAGING — RF DG THORACIC SPINE 2V
1 series · 3 of 3 positions shown · non-contrast
Comparison: MR [DATE]

CLINICAL DATA: Bilateral thoracic laminectomy for epidural abscess

EXAM:
THORACIC SPINE 2 VIEWS; DG C-ARM 1-60 MIN-NO REPORT

[Series 1: dg no report - auto finalize · 0.20mm/px · 3 of 3 slices shown]
[im 1/3]
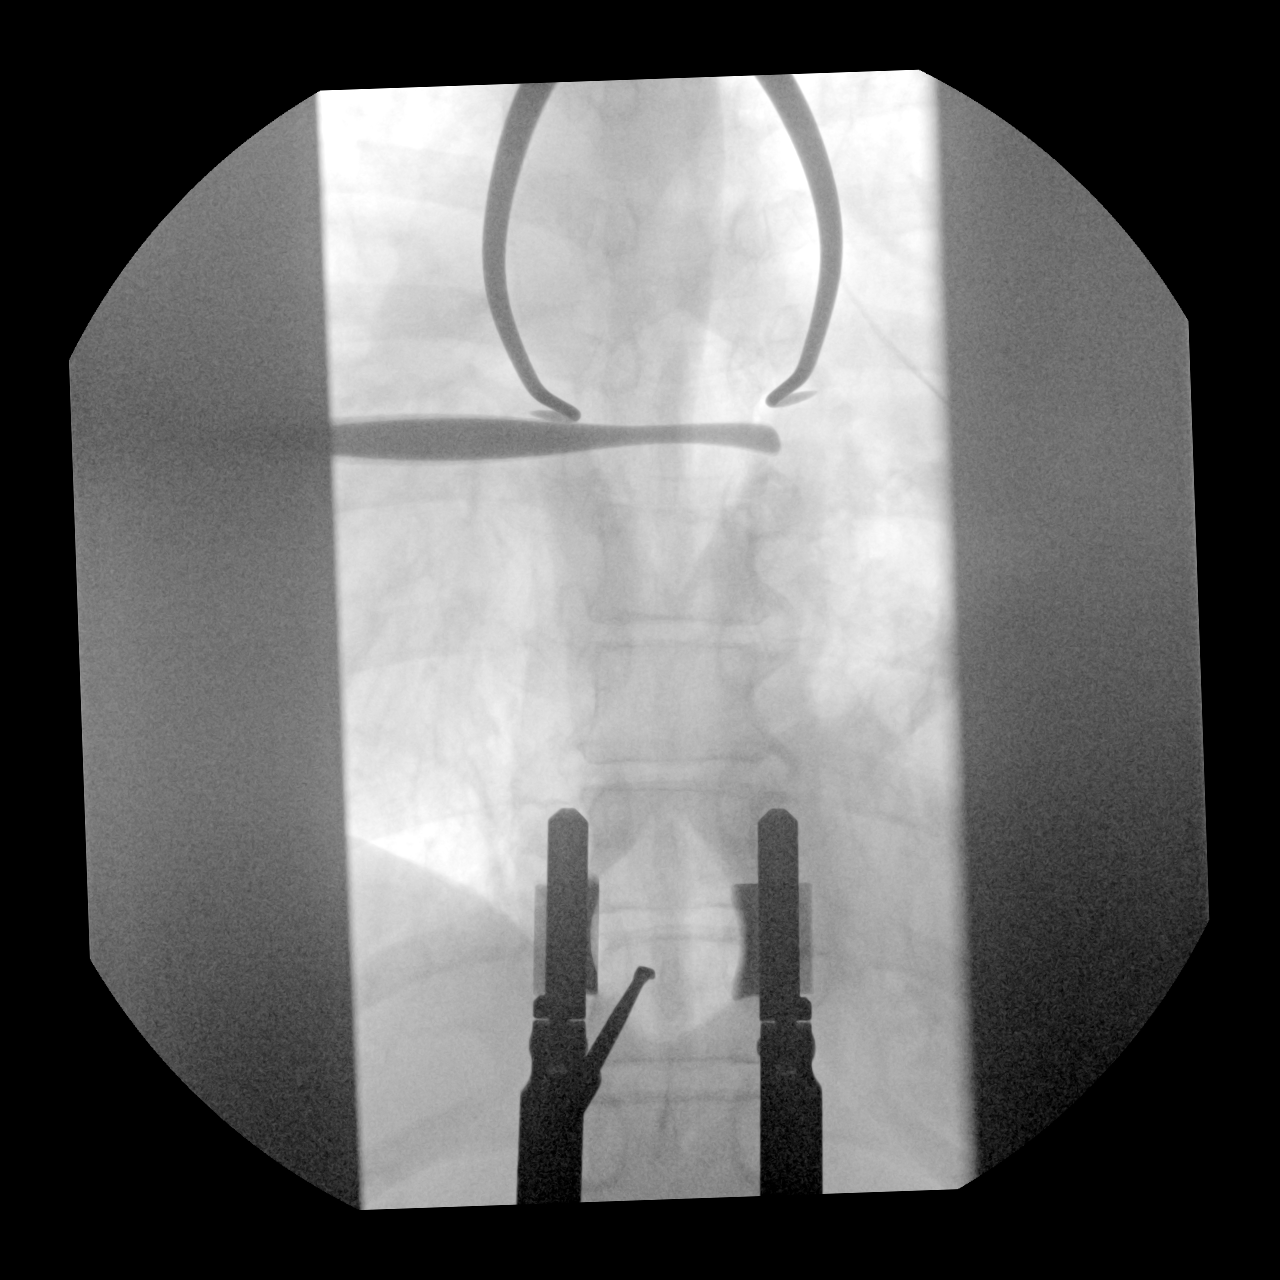
[im 2/3]
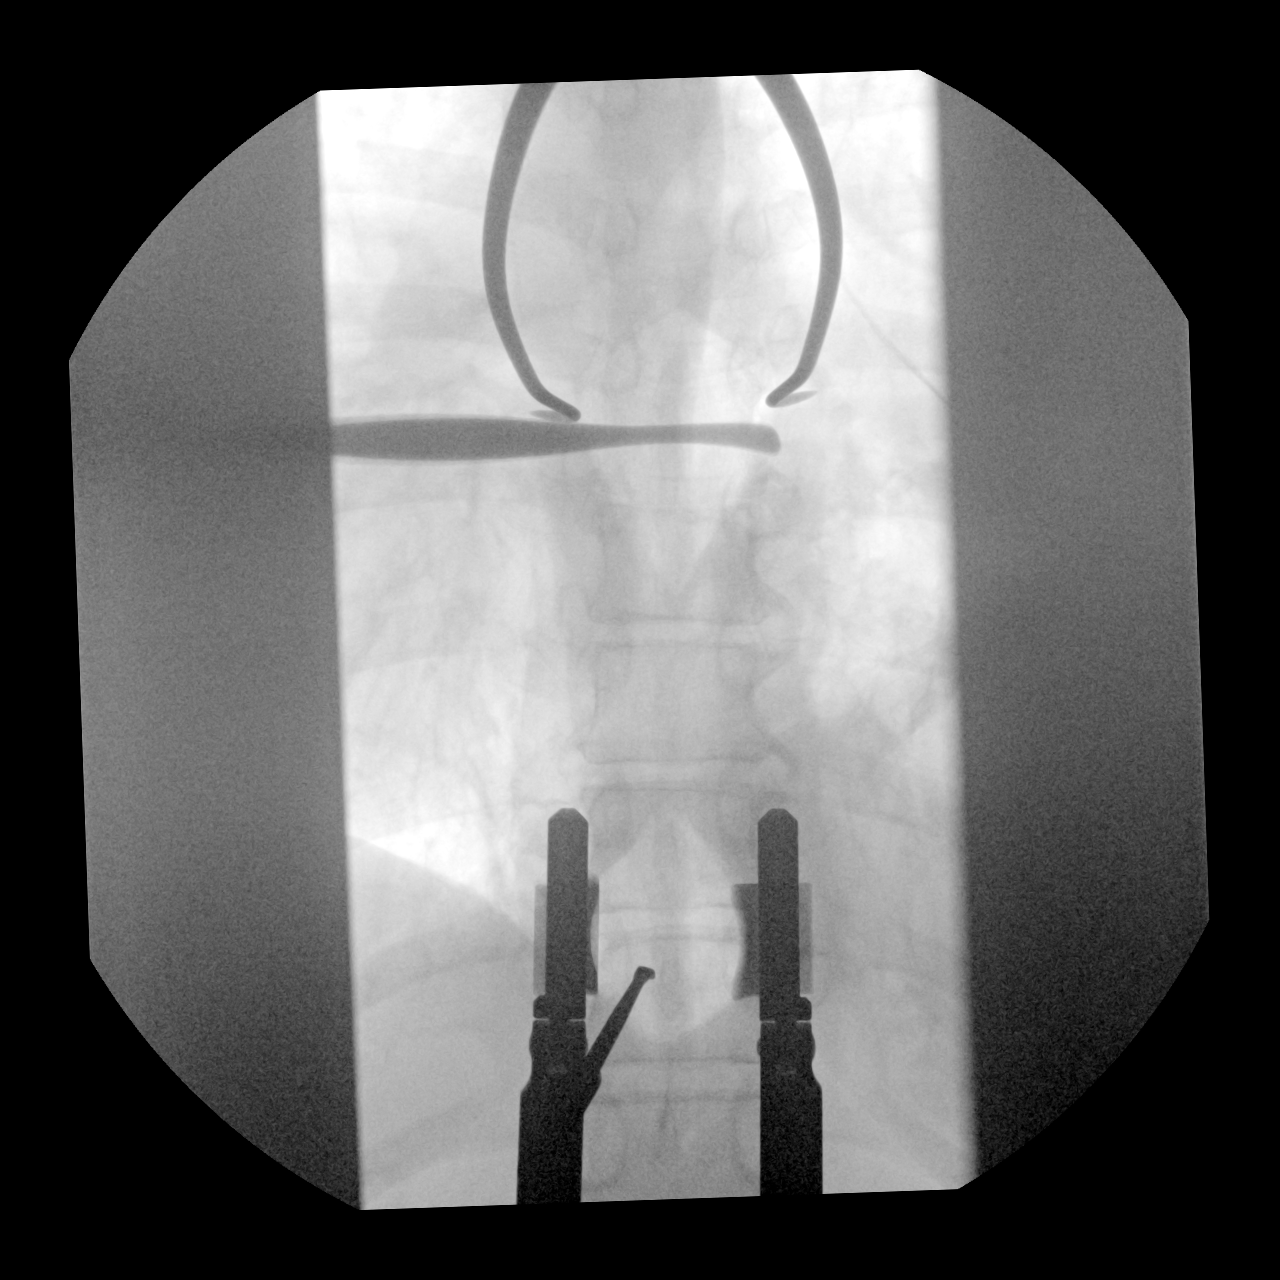
[im 3/3]
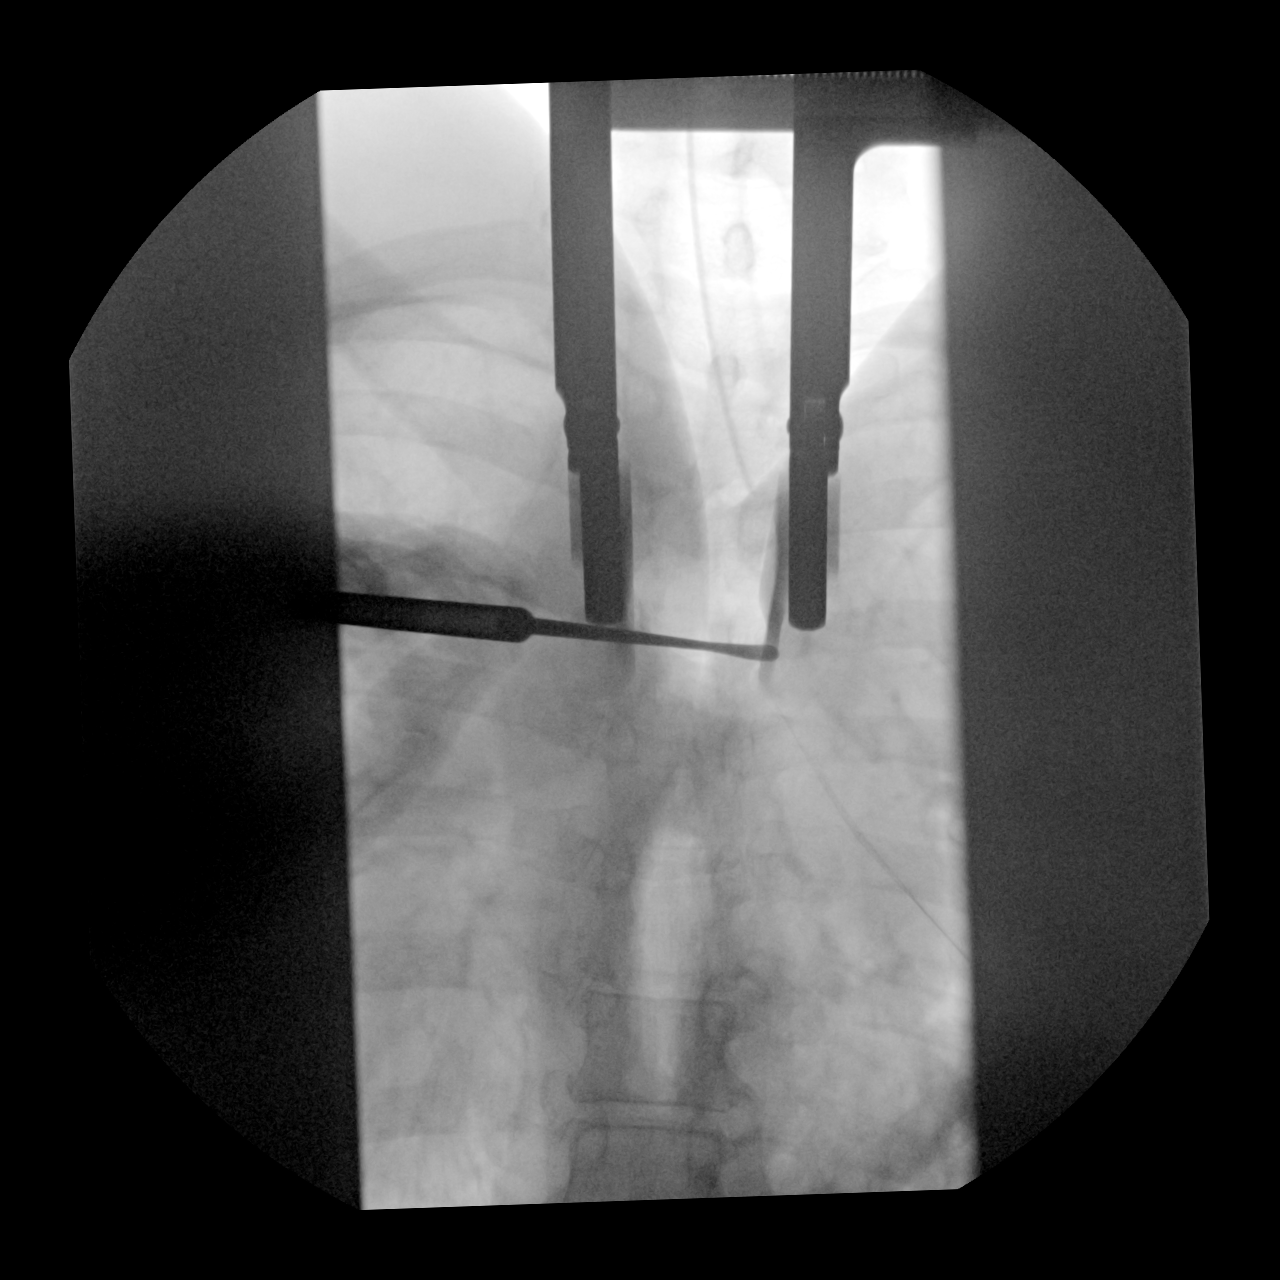

[3 of 3 positions shown; findings below may reference images not displayed]

FINDINGS: Intraoperative fluoroscopy was obtained during the process of a
thoracic laminectomy for evacuation of an epidural abscess. Image #5
demonstrates a blunt surgical implement projects over the T4
vertebral level on the frontal radiograph. Surgical retractors seen
posteriorly.

Images labeled as #3,4 are more difficult to localize given the
absence of localizing landmarks. Recommend correlation with
operative report.
IMPRESSION: Intraoperative fluoroscopy obtained during the process of a thoracic
laminectomy for evacuation of an epidural abscess.

## 2020-06-17 IMAGING — MR MR CERVICAL SPINE W/O CM
4 of 6 series · 28 of 48 positions shown · non-contrast
Comparison: MRI [DATE].

CLINICAL DATA: Septic arthritis

EXAM:
MRI CERVICAL AND THORACIC SPINE WITHOUT CONTRAST
TECHNIQUE: Multiplanar and multiecho pulse sequences of the cervical spine, to
include the craniocervical junction and cervicothoracic junction,
and the thoracic spine, were obtained without intravenous contrast.

[Series 5: T2 · sagittal · 3.0mm · 0.62mm/px · 5 of 15 slices shown (1 of 2)]
[im 1/15]
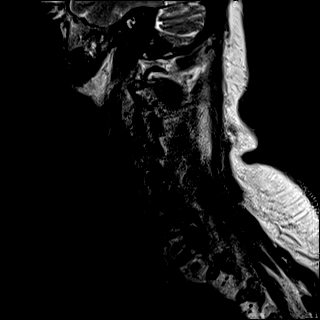
[im 4/15]
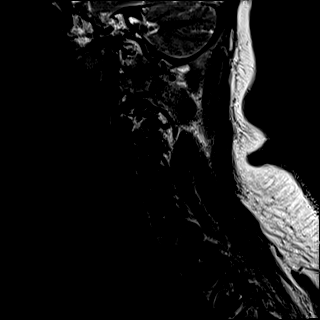
[im 8/15]
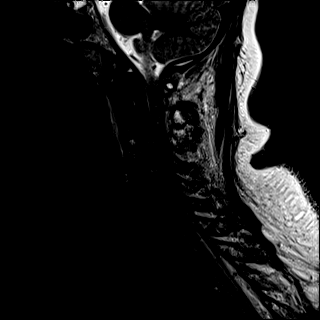
[im 11/15]
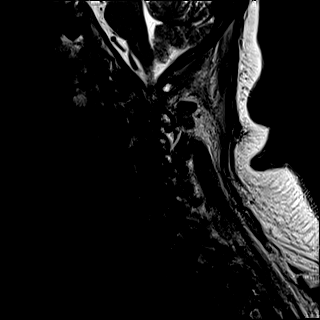
[im 15/15]
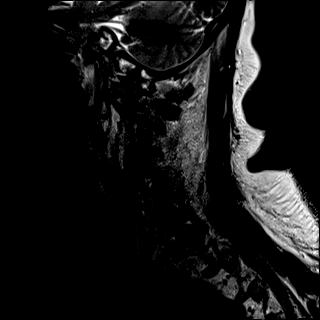

[Series 7: STIR · sagittal · 3.0mm · 0.62mm/px · 3 of 15 slices shown]
[im 1/15]
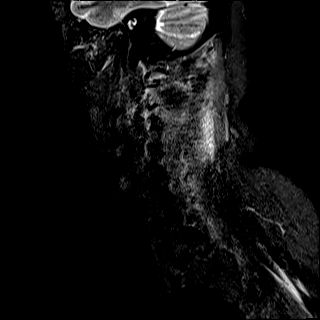
[im 8/15]
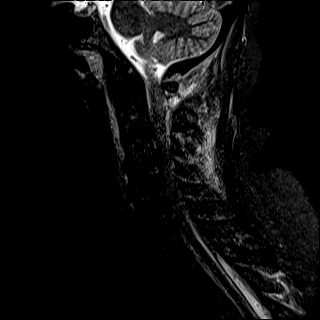
[im 15/15]
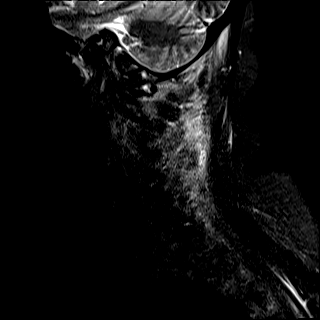

[Series 8: T2 · axial · 3.0mm · 0.70mm/px · z∈[-88,+5]mm · 11 of 29 slices shown (2 of 2)]
[im 1/29]
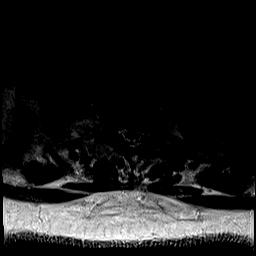
[im 3/29]
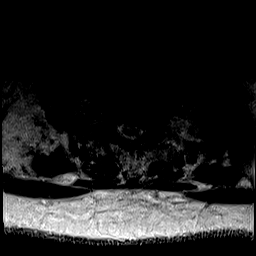
[im 6/29]
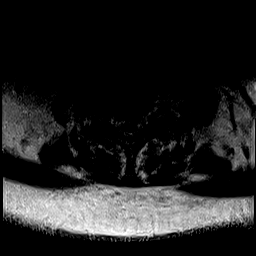
[im 9/29]
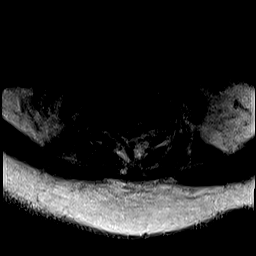
[im 12/29]
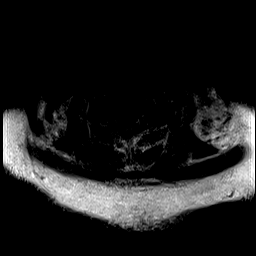
[im 15/29]
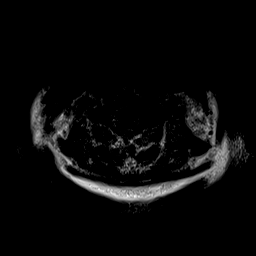
[im 17/29]
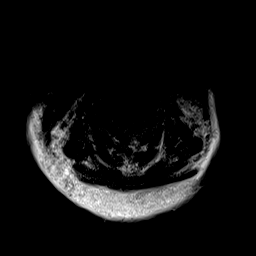
[im 20/29]
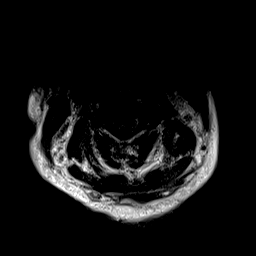
[im 23/29]
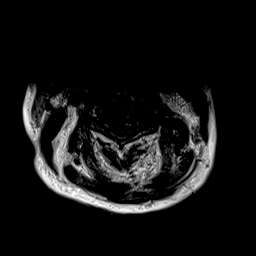
[im 26/29]
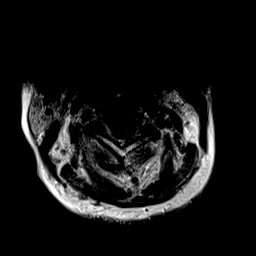
[im 29/29]
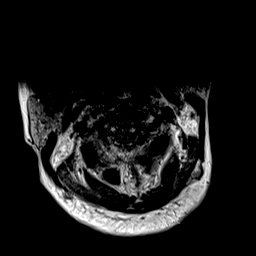

[Series 10: T1 · axial · non-contrast · 3.0mm · 0.35mm/px · z∈[-88,-5]mm · 9 of 29 slices shown]
[im 1/29]
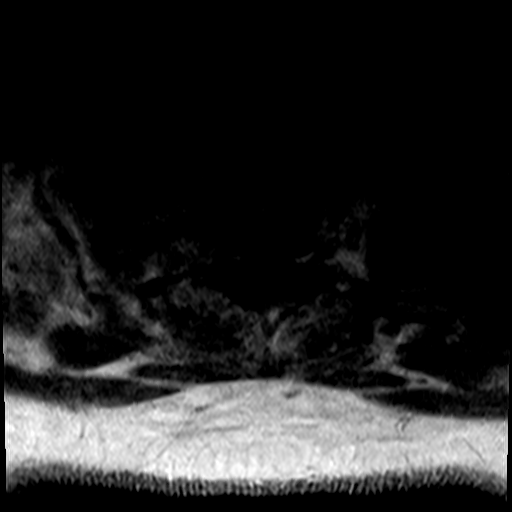
[im 3/29]
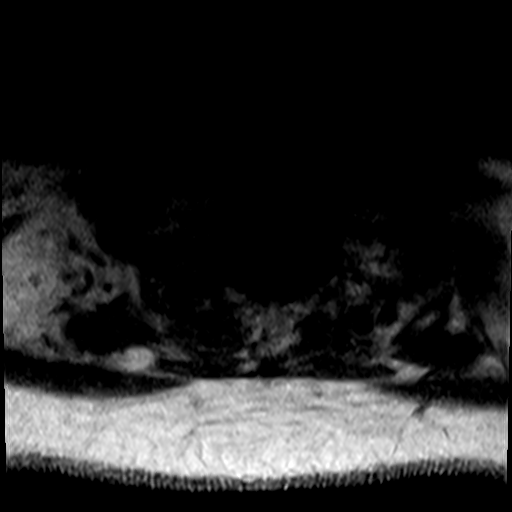
[im 6/29]
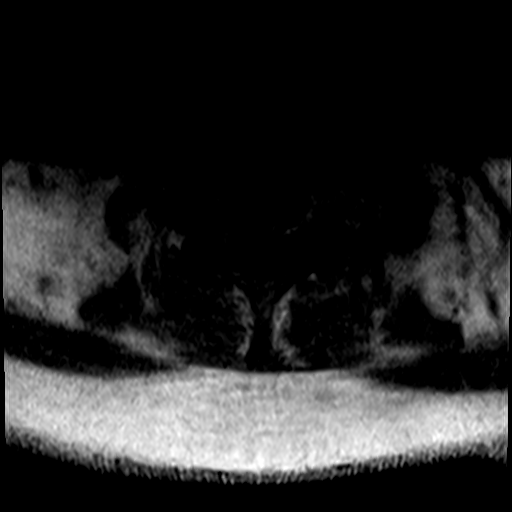
[im 9/29]
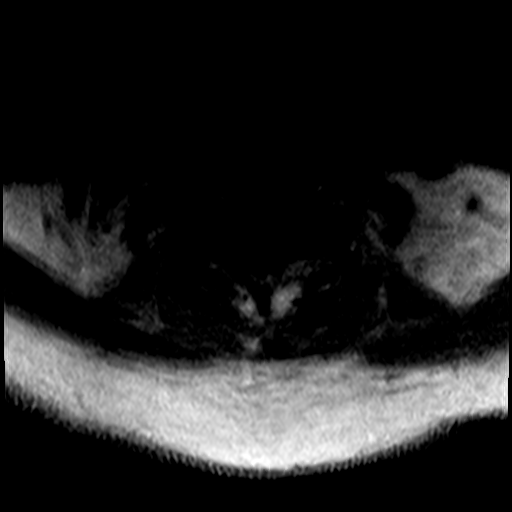
[im 12/29]
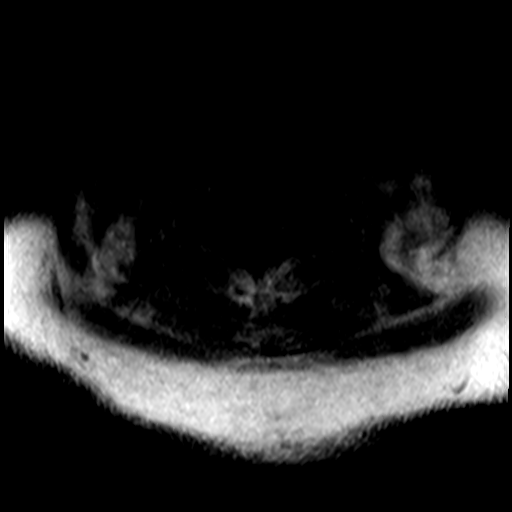
[im 15/29]
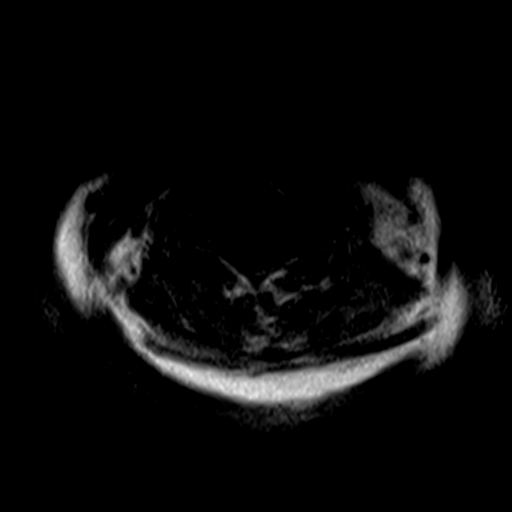
[im 17/29]
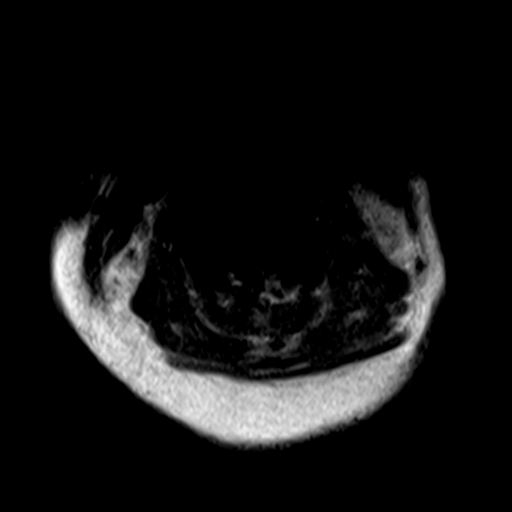
[im 20/29]
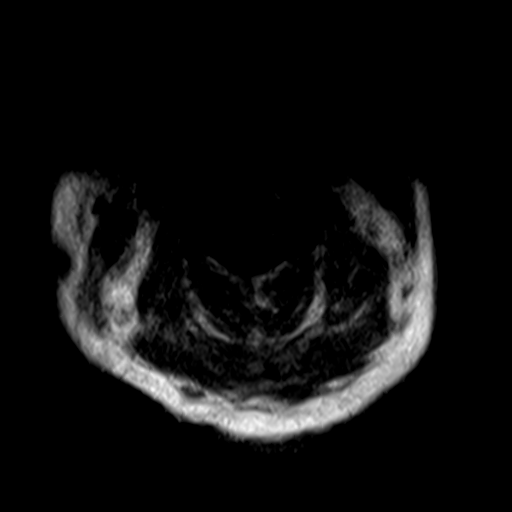
[im 26/29]
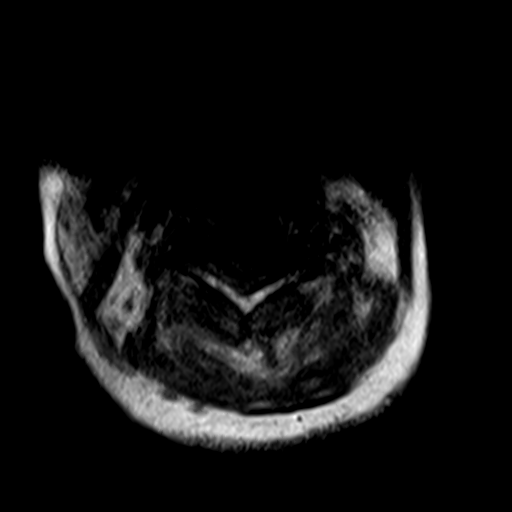

[28 of 48 positions shown; findings below may reference images not displayed]

FINDINGS: MRI CERVICAL SPINE FINDINGS

Motion limited examination.

Alignment: Normal.

Vertebrae: Vertebral body heights are maintained. No focal marrow
signal or disc signal abnormality to suggest discitis/osteomyelitis.

Canal/Cord: Significantly limited evaluation given motion without
definite cord signal abnormality or canal fluid collection.

Posterior Fossa, vertebral arteries, paraspinal tissues: There is
edema in the posterior paraspinal soft tissues in the upper cervical
spine. No evidence of discrete fluid collection.

Disc levels:

C2-C3: Posterior disc osteophyte complex with mass effect on the
ventral thecal sac. No significant foraminal stenosis.

C3-C4: Left eccentric posterior disc osteophyte complex with mass
effect on the thecal sac and deformity of the ventral cord with at
least moderate canal stenosis. Bilateral facet and uncovertebral
hypertrophy. Evaluation of the foramina is limited with possibly
moderate bilateral foraminal stenosis

C4-C5: Posterior disc osteophyte complex with mass effect on the
thecal sac and suspected moderate canal stenosis. Bilateral facet
and uncovertebral hypertrophy. Evaluation of foramina is limited
with suspected moderate bilateral foraminal stenosis.

C5-C6: Posterior disc osteophyte complex with disc contacting the
ventral cord and suspected moderate canal stenosis. Evaluation of
the foramina is limited with possibly severe bilateral foraminal
stenosis

C6-C7: Posterior disc osteophyte complex with bilateral facet and
uncovertebral hypertrophy.

C7-T1: No significant disc protrusion, foraminal stenosis, or canal
stenosis.

MRI THORACIC SPINE FINDINGS

Alignment:  Normal.

Vertebrae: Vertebral body heights are maintained. No focal marrow
edema to suggest osteomyelitis.

Canal/Cord: There is a large fluid collection within the dorsal
epidural space spanning the entire thoracic spine (C7-T1 to T12-L1),
measuring up to 1.0 x 1.5 cm at T7-T8. The collection appears
increased in size to the prior with increased effacement of CSF
around the cord. The epidural fluid collection along with a
posterior disc osteophyte at T6-T7 results in severe canal stenosis
at this level with near complete effacement of CSF. There is mild
canal stenosis at T2-T3, T3-T4, T10-T11 and T11-T12 and moderate
canal stenosis at T4-T5, T5-T6, T7-T8, T8-T9, T9-T10. No evidence of
abnormal cord signal.

Paraspinal and other soft tissues: Edema in the posterior paraspinal
soft tissues in the upper thoracic spine and lower thoracic spine.
Nonspecific T2 hyperintense hepatic lesion, not well characterized
on this study. Bibasilar lung opacities, better characterized on
recent chest radiograph.

Disc levels:

Canal stenosis is detailed above. Suspected moderate left foraminal
stenosis at T8-T9.
IMPRESSION: Motion limited study.

1. Large dorsal epidural fluid collection spanning the entirety of
the thoracic spine, increased in size relative to [DATE] MRI.
The collection along with degenerative change results in severe
canal stenosis at T6-T7 with near complete effacement of CSF and
multilevel moderate canal stenosis in the thoracic spine, progressed
since the prior and detailed above. While nonspecific by imaging,
findings are concerning for epidural abscess given the patient's
clinical history. Postcontrast imaging could provide more complete
evaluation if clinically indicated.
2. Edema within the paraspinal soft tissues in the upper cervical
and thoracic spine and lower thoracic spine, concerning for
infection/cellulitis given the clinical history. No discrete
drainable fluid collection identified in the paraspinal soft tissues
of the cervical or thoracic spine identified, although evaluation is
limited by motion and absence of IV contrast. Please see separately
dictated lumbar spine for infectious findings in the lumbar spine.
3. Evaluation of degenerative change in the cervical spine is
significantly limited given motion. Multilevel suspected at least
moderate canal stenosis, worst on the left at C3-C4 where posterior
disc osteophyte contacts and deforms the left cord with effacement
of the left root entry zone. Evaluation of the foramina is
particularly limited, but there is suspected at least moderate
multilevel foraminal stenosis in the cervical spine and on the left
at T8-T9. Repeat MRI (possibly with sedation) could further
characterize if clinically indicated.

Critical findings discussed with Dr. ILLIMANI via telephone at [DATE].

## 2020-06-17 SURGERY — THORACIC LAMINECTOMY FOR EPIDURAL ABSCESS
Anesthesia: General | Laterality: Bilateral

## 2020-06-17 MED ORDER — AMLODIPINE BESYLATE 10 MG PO TABS
10.0000 mg | ORAL_TABLET | Freq: Every day | ORAL | Status: DC
  Administered 2020-06-18: 09:00:00 10 mg via ORAL
  Filled 2020-06-17: qty 1

## 2020-06-17 MED ORDER — REMIFENTANIL HCL 1 MG IV SOLR
INTRAVENOUS | Status: DC | PRN
  Administered 2020-06-17: .1 ug/kg/min via INTRAVENOUS

## 2020-06-17 MED ORDER — SEVOFLURANE IN SOLN
RESPIRATORY_TRACT | Status: AC
  Filled 2020-06-17: qty 250

## 2020-06-17 MED ORDER — PROPOFOL 10 MG/ML IV BOLUS
INTRAVENOUS | Status: DC | PRN
  Administered 2020-06-17: 120 mg via INTRAVENOUS

## 2020-06-17 MED ORDER — ONDANSETRON HCL 4 MG/2ML IJ SOLN
INTRAMUSCULAR | Status: DC | PRN
  Administered 2020-06-17: 4 mg via INTRAVENOUS

## 2020-06-17 MED ORDER — DEXAMETHASONE SODIUM PHOSPHATE 10 MG/ML IJ SOLN
INTRAMUSCULAR | Status: DC | PRN
  Administered 2020-06-17: 5 mg via INTRAVENOUS

## 2020-06-17 MED ORDER — THROMBIN (RECOMBINANT) 5000 UNITS EX SOLR
CUTANEOUS | Status: AC
  Filled 2020-06-17: qty 5000

## 2020-06-17 MED ORDER — OXYCODONE HCL 5 MG PO TABS: 5.0000 mg | ORAL_TABLET | Freq: Once | ORAL | Status: DC | PRN

## 2020-06-17 MED ORDER — FENTANYL CITRATE (PF) 100 MCG/2ML IJ SOLN
INTRAMUSCULAR | Status: DC | PRN
  Administered 2020-06-17 (×2): 50 ug via INTRAVENOUS

## 2020-06-17 MED ORDER — SODIUM CHLORIDE FLUSH 0.9 % IV SOLN
INTRAVENOUS | Status: AC
  Filled 2020-06-17: qty 10

## 2020-06-17 MED ORDER — BUPIVACAINE-EPINEPHRINE (PF) 0.5% -1:200000 IJ SOLN
INTRAMUSCULAR | Status: DC | PRN
  Administered 2020-06-17: 9 mL via PERINEURAL

## 2020-06-17 MED ORDER — PHENYLEPHRINE HCL (PRESSORS) 10 MG/ML IV SOLN
INTRAVENOUS | Status: DC | PRN
  Administered 2020-06-17 (×3): 100 ug via INTRAVENOUS
  Administered 2020-06-17: 200 ug via INTRAVENOUS
  Administered 2020-06-17 (×2): 100 ug via INTRAVENOUS
  Administered 2020-06-17: 200 ug via INTRAVENOUS

## 2020-06-17 MED ORDER — LIDOCAINE HCL (CARDIAC) PF 100 MG/5ML IV SOSY
PREFILLED_SYRINGE | INTRAVENOUS | Status: DC | PRN
  Administered 2020-06-17: 100 mg via INTRAVENOUS

## 2020-06-17 MED ORDER — MIDAZOLAM HCL 2 MG/2ML IJ SOLN
INTRAMUSCULAR | Status: DC | PRN
  Administered 2020-06-17: 2 mg via INTRAVENOUS

## 2020-06-17 MED ORDER — PROPOFOL 10 MG/ML IV BOLUS
INTRAVENOUS | Status: AC
  Filled 2020-06-17: qty 20

## 2020-06-17 MED ORDER — LIDOCAINE HCL (PF) 2 % IJ SOLN
INTRAMUSCULAR | Status: AC
  Filled 2020-06-17: qty 5

## 2020-06-17 MED ORDER — FENTANYL CITRATE (PF) 100 MCG/2ML IJ SOLN
INTRAMUSCULAR | Status: AC
  Filled 2020-06-17: qty 2

## 2020-06-17 MED ORDER — AMLODIPINE BESYLATE 5 MG PO TABS
5.0000 mg | ORAL_TABLET | Freq: Once | ORAL | Status: AC
  Administered 2020-06-17: 16:00:00 5 mg via ORAL
  Filled 2020-06-17: qty 1

## 2020-06-17 MED ORDER — ACETAMINOPHEN 10 MG/ML IV SOLN
INTRAVENOUS | Status: DC | PRN
  Administered 2020-06-17: 1000 mg via INTRAVENOUS

## 2020-06-17 MED ORDER — MIDAZOLAM HCL 2 MG/2ML IJ SOLN
INTRAMUSCULAR | Status: AC
  Filled 2020-06-17: qty 2

## 2020-06-17 MED ORDER — VASOPRESSIN 20 UNIT/ML IV SOLN
INTRAVENOUS | Status: DC | PRN
  Administered 2020-06-17: 1 [IU] via INTRAVENOUS
  Administered 2020-06-17 (×2): .5 [IU] via INTRAVENOUS
  Administered 2020-06-17: 1 [IU] via INTRAVENOUS

## 2020-06-17 MED ORDER — SUCCINYLCHOLINE CHLORIDE 20 MG/ML IJ SOLN
INTRAMUSCULAR | Status: DC | PRN
  Administered 2020-06-17: 140 mg via INTRAVENOUS

## 2020-06-17 MED ORDER — ACETAMINOPHEN 10 MG/ML IV SOLN
INTRAVENOUS | Status: AC
  Filled 2020-06-17: qty 100

## 2020-06-17 MED ORDER — FENTANYL CITRATE (PF) 100 MCG/2ML IJ SOLN: 25.0000 ug | INTRAMUSCULAR | Status: DC | PRN

## 2020-06-17 MED ORDER — SODIUM CHLORIDE 0.9 % IV SOLN
INTRAVENOUS | Status: DC | PRN
  Administered 2020-06-17: 60 ug/min via INTRAVENOUS
  Administered 2020-06-17: 30 ug/min via INTRAVENOUS

## 2020-06-17 MED ORDER — GADOBUTROL 1 MMOL/ML IV SOLN: 10.0000 mL | Freq: Once | INTRAVENOUS | Status: DC | PRN

## 2020-06-17 MED ORDER — SODIUM CHLORIDE 0.9 % IV SOLN: INTRAVENOUS | Status: DC | PRN

## 2020-06-17 MED ORDER — REMIFENTANIL HCL 1 MG IV SOLR
INTRAVENOUS | Status: AC
  Filled 2020-06-17: qty 2000

## 2020-06-17 MED ORDER — SUCCINYLCHOLINE CHLORIDE 200 MG/10ML IV SOSY
PREFILLED_SYRINGE | INTRAVENOUS | Status: AC
  Filled 2020-06-17: qty 10

## 2020-06-17 MED ORDER — OXYCODONE HCL 5 MG/5ML PO SOLN: 5.0000 mg | Freq: Once | ORAL | Status: DC | PRN

## 2020-06-17 MED ORDER — LORAZEPAM 1 MG PO TABS
1.0000 mg | ORAL_TABLET | Freq: Once | ORAL | Status: AC | PRN
  Administered 2020-06-17: 02:00:00 1 mg via ORAL
  Filled 2020-06-17: qty 1

## 2020-06-17 MED ORDER — THROMBIN 5000 UNITS EX SOLR
CUTANEOUS | Status: DC | PRN
  Administered 2020-06-17 (×2): 5000 [IU] via TOPICAL

## 2020-06-17 SURGICAL SUPPLY — 69 items
ADH SKN CLS APL DERMABOND .7 (GAUZE/BANDAGES/DRESSINGS)
AGENT HMST MTR 8 SURGIFLO (HEMOSTASIS)
APL PRP STRL LF DISP 70% ISPRP (MISCELLANEOUS) ×1
APL SRG 60D 8 XTD TIP BNDBL (TIP)
BLADE BOVIE TIP EXT 4 (BLADE) ×2 IMPLANT
BUR NEURO DRILL SOFT 3.0X3.8M (BURR) IMPLANT
CATH ROBINSON RED A/P 8FR (CATHETERS) ×2 IMPLANT
CHLORAPREP W/TINT 26 (MISCELLANEOUS) ×2 IMPLANT
CNTNR SPEC 2.5X3XGRAD LEK (MISCELLANEOUS) ×1
CONT SPEC 4OZ STER OR WHT (MISCELLANEOUS) ×1
CONT SPEC 4OZ STRL OR WHT (MISCELLANEOUS) ×1
CONTAINER SPEC 2.5X3XGRAD LEK (MISCELLANEOUS) ×1 IMPLANT
COUNTER NEEDLE 20/40 LG (NEEDLE) ×4 IMPLANT
COVER LIGHT HANDLE STERIS (MISCELLANEOUS) ×4 IMPLANT
COVER WAND RF STERILE (DRAPES) ×2 IMPLANT
CUP MEDICINE 2OZ PLAST GRAD ST (MISCELLANEOUS) ×2 IMPLANT
DERMABOND ADVANCED (GAUZE/BANDAGES/DRESSINGS)
DERMABOND ADVANCED .7 DNX12 (GAUZE/BANDAGES/DRESSINGS) IMPLANT
DRAPE C-ARM 42X72 X-RAY (DRAPES) ×2 IMPLANT
DRAPE C-ARMOR (DRAPES) IMPLANT
DRAPE LAPAROTOMY 100X77 ABD (DRAPES) ×2 IMPLANT
DRAPE SURG 17X11 SM STRL (DRAPES) ×2 IMPLANT
DRSG TEGADERM 2-3/8X2-3/4 SM (GAUZE/BANDAGES/DRESSINGS) ×6 IMPLANT
DURASEAL APPLICATOR TIP (TIP) IMPLANT
DURASEAL SPINE SEALANT 3ML (MISCELLANEOUS) IMPLANT
ELECT CAUTERY BLADE TIP 2.5 (TIP) ×2
ELECT EZSTD 165MM 6.5IN (MISCELLANEOUS) ×2
ELECT REM PT RETURN 9FT ADLT (ELECTROSURGICAL) ×2
ELECTRODE CAUTERY BLDE TIP 2.5 (TIP) ×1 IMPLANT
ELECTRODE EZSTD 165MM 6.5IN (MISCELLANEOUS) ×1 IMPLANT
ELECTRODE REM PT RTRN 9FT ADLT (ELECTROSURGICAL) ×1 IMPLANT
GAUZE SPONGE 4X4 12PLY STRL (GAUZE/BANDAGES/DRESSINGS) IMPLANT
GLOVE SRG 8 PF TXTR STRL LF DI (GLOVE) ×1 IMPLANT
GLOVE SURG SYN 7.0 (GLOVE) ×4 IMPLANT
GLOVE SURG SYN 8.0 (GLOVE) ×2 IMPLANT
GLOVE SURG UNDER POLY LF SZ7 (GLOVE) ×2 IMPLANT
GLOVE SURG UNDER POLY LF SZ8 (GLOVE) ×2
GOWN STRL REUS W/ TWL LRG LVL3 (GOWN DISPOSABLE) ×1 IMPLANT
GOWN STRL REUS W/ TWL XL LVL3 (GOWN DISPOSABLE) ×2 IMPLANT
GOWN STRL REUS W/TWL LRG LVL3 (GOWN DISPOSABLE) ×2
GOWN STRL REUS W/TWL XL LVL3 (GOWN DISPOSABLE) ×4
GRADUATE 1200CC STRL 31836 (MISCELLANEOUS) ×2 IMPLANT
KIT TURNOVER KIT A (KITS) ×2 IMPLANT
MANIFOLD NEPTUNE II (INSTRUMENTS) ×2 IMPLANT
MARKER SKIN DUAL TIP RULER LAB (MISCELLANEOUS) IMPLANT
NDL SAFETY ECLIPSE 18X1.5 (NEEDLE) IMPLANT
NEEDLE HYPO 18GX1.5 SHARP (NEEDLE)
NEEDLE HYPO 22GX1.5 SAFETY (NEEDLE) IMPLANT
NS IRRIG 1000ML POUR BTL (IV SOLUTION) ×2 IMPLANT
PACK LAMINECTOMY NEURO (CUSTOM PROCEDURE TRAY) ×2 IMPLANT
PAD ARMBOARD 7.5X6 YLW CONV (MISCELLANEOUS) ×4 IMPLANT
SPOGE SURGIFLO 8M (HEMOSTASIS)
SPONGE GAUZE 2X2 8PLY STRL LF (GAUZE/BANDAGES/DRESSINGS) ×6 IMPLANT
SPONGE SURGIFLO 8M (HEMOSTASIS) IMPLANT
STAPLER SKIN PROX 35W (STAPLE) IMPLANT
SUT ETHILON 3-0 FS-10 30 BLK (SUTURE) ×12
SUT POLYSORB 2-0 5X18 GS-10 (SUTURE) ×16 IMPLANT
SUT SILK 2 0 SH (SUTURE) ×4 IMPLANT
SUT VIC AB 0 CT1 18XCR BRD 8 (SUTURE) ×4 IMPLANT
SUT VIC AB 0 CT1 27 (SUTURE) ×2
SUT VIC AB 0 CT1 27XCR 8 STRN (SUTURE) ×1 IMPLANT
SUT VIC AB 0 CT1 8-18 (SUTURE) ×8
SUTURE EHLN 3-0 FS-10 30 BLK (SUTURE) ×6 IMPLANT
SYR 10ML LL (SYRINGE) ×2 IMPLANT
SYR 20ML LL LF (SYRINGE) ×2 IMPLANT
SYR 30ML LL (SYRINGE) ×2 IMPLANT
SYR 3ML LL SCALE MARK (SYRINGE) ×2 IMPLANT
TOWEL OR 17X26 4PK STRL BLUE (TOWEL DISPOSABLE) ×4 IMPLANT
TUBING CONNECTING 10 (TUBING) ×2 IMPLANT

## 2020-06-17 NOTE — Anesthesia Preprocedure Evaluation (Signed)
Anesthesia Evaluation  Patient identified by MRN, date of birth, ID band Patient awake and Patient confused    Reviewed: Allergy & Precautions, H&P , NPO status , Patient's Chart, lab work & pertinent test results  History of Anesthesia Complications Negative for: history of anesthetic complications  Airway Mallampati: III  TM Distance: >3 FB Neck ROM: full    Dental  (+) Chipped, Poor Dentition, Missing   Pulmonary asthma ,    Pulmonary exam normal        Cardiovascular hypertension, Normal cardiovascular exam+ dysrhythmias      Neuro/Psych PSYCHIATRIC DISORDERS  Neuromuscular disease negative psych ROS   GI/Hepatic negative GI ROS, Neg liver ROS,   Endo/Other  diabetes, Type 2, Insulin DependentHypothyroidism   Renal/GU Renal disease     Musculoskeletal   Abdominal   Peds  Hematology negative hematology ROS (+)   Anesthesia Other Findings Septic  Past Medical History: 03/11/2015: Acute hemorrhoid No date: Anxiety No date: Arthritis No date: Asthma No date: Cancer ([place]) No date: Chronic back pain No date: CLL (chronic lymphocytic leukemia) (HCC) No date: Depression No date: Diabetes mellitus without complication (HCC) No date: Genital herpes     Comment:  type 2 No date: Hypertension No date: Hypothyroidism No date: Vertigo  Past Surgical History: No date: ABDOMINAL HYSTERECTOMY 2009: BILATERAL SALPINGOOPHORECTOMY 05/17/2016: BREAST BIOPSY; Right     Comment:  FIBROADENOMATOUS CHANGE AND SCLEROSING ADENOSIS WITH 06/16/2015: COLONOSCOPY WITH PROPOFOL; N/A     Comment:  Procedure: COLONOSCOPY WITH PROPOFOL;  Surgeon: [name], MD;  Location: [facility] ENDOSCOPY;  Service:               Endoscopy;  Laterality: N/A; 2009: LAPAROSCOPIC SUPRACERVICAL HYSTERECTOMY     Comment:  due to leio No date: OOPHORECTOMY  BMI    Body Mass Index: 32.62 kg/m       Reproductive/Obstetrics negative OB ROS                             Anesthesia Physical Anesthesia Plan  ASA: IV and emergent  Anesthesia Plan: General ETT   Post-op Pain Management:    Induction: Intravenous  PONV Risk Score and Plan: Ondansetron, Dexamethasone, Midazolam and Treatment may vary due to age or medical condition  Airway Management Planned: Oral ETT  Additional Equipment:   Intra-op Plan:   Post-operative Plan: Extubation in OR and Possible Post-op intubation/ventilation  Informed Consent: I have reviewed the patients History and Physical, chart, labs and discussed the procedure including the risks, benefits and alternatives for the proposed anesthesia with the patient or authorized representative who has indicated his/her understanding and acceptance.     Dental Advisory Given  Plan Discussed with: Anesthesiologist, CRNA and Surgeon  Anesthesia Plan Comments: (Daughter consented for risks of anesthesia including but not limited to:  - adverse reactions to medications - damage to eyes, teeth, lips or other oral mucosa - nerve damage due to positioning  - sore throat or hoarseness - Damage to heart, brain, nerves, lungs, other parts of body or loss of life  She voiced understanding.)        Anesthesia Quick Evaluation

## 2020-06-17 NOTE — Progress Notes (Signed)
Physical Therapy Treatment Patient Details Name: Cherlyn Syring MRN: [id number] DOB: [birth year] Today's Date: 06/17/2020    History of Present Illness Pt is a 55 y/o female admitted from home on 06/09/20 after presenting with AMS. Pt being treated for acute encephalopathy. 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 Pt also found to have paraspinal abscess, small epidural abscess, septic arthritis L2-L3 and L3-L4 with Neurosurgery & IR recommended serial imaging. PMH: hypothyroidism, DDD, cervical radiculopathy, CLL, DM, HTN, HLD, anxiety, arthritis, CA, chronic back pain, asthma, depression, vertigo.    PT Comments    Patient is lethargic today and has difficulty following single step commands. Minimal active participation with bed level exercise despite facilitation and cues for technique. Total assistance required for rolling in bed with activity tolerance limited by pain and patient reports her heart is racing when turning to left side (heart rate 118bpm, Sp02 99% on room air). Attempted to assist patient to edge of bed, however she did not tolerate the head of bed being past 45 degrees due to reported back pain. No progress made this session, limited by pain, difficulty following commands, limited active participation. Recommend to continue PT to maximize independence and address functional limitations listed below. Hopefully patient will have continued improvements and participation with physical therapy with ongoing management of acute medical issues. [name] vs SNF depending on progress.    Follow Up Recommendations  [name] (depending on progress and particiation with therapy)     Equipment Recommendations  Rolling walker with 5" wheels;3in1 (PT)    Recommendations for Other Services       Precautions / Restrictions Precautions Precautions: Fall Restrictions Weight Bearing Restrictions: No    Mobility  Bed Mobility Overal bed mobility: Needs Assistance Bed Mobility:  Rolling;Supine to Sit Rolling: Total assist   Supine to sit: Max assist;+2 for physical assistance Sit to supine: Max assist;+2 for safety/equipment   General bed mobility comments: attempted to assist patient to edge of bed. patient tolerated LE movement towards edge of bed with total assistance from therapist. patient does not tolerate head of bed being past 45 degrees. patient requested to stop further mobility due to pain reported in back (unable to state specifically where pain is). total assistance for rolling to left side with minimal to no participation with bed mobility efforts. patient does state she feels that her heart is racing with turning on her side. heart rate 118bpm and Sp02 99% on room air    Transfers                 General transfer comment: Unable/unsafe to attempt secondary d/t dizziness while sitting EOB  Ambulation/Gait                 Stairs             Wheelchair Mobility    Modified Rankin (Stroke Patients Only)       Balance Overall balance assessment: Needs assistance Sitting-balance support: Feet supported;Bilateral upper extremity supported Sitting balance-Leahy Scale: Poor                                      Cognition Arousal/Alertness: Lethargic Behavior During Therapy: Flat affect Overall Cognitive Status: Impaired/Different from baseline  General Comments: patient is slow to respond verbally and with activity. minimal verbalizations this session. patient has difficulty following single step commands evern with repetition and extra time      Exercises General Exercises - Lower Extremity Ankle Circles/Pumps: AAROM;PROM;Strengthening;Both;10 reps;Supine Heel Slides: AAROM;PROM;Strengthening;Both;10 reps;Supine Hip ABduction/ADduction: PROM;AAROM;Strengthening;Both;10 reps;Supine Other Exercises Other Exercises: AAROM-PROM with verbal cues for task initiation  and particiaption with LE strenghtening exercises    General Comments General comments (skin integrity, edema, etc.): Pt c/o of dizziness while sitting EOB and was returned to supine. However pt motivated to attempt again 2x trials, c/o dizziness with each trial; SpO2 81% HR 102 via pulse ox reading on finger, however questionable reading d/t pt's gel nail polish on fingers; pt returned to supine with SpO2 86% on finger, but 93% on toe. Recommend taking orthostatics next session      Pertinent Vitals/Pain Pain Assessment: Faces Faces Pain Scale: Hurts even more Pain Location: "back" Pain Descriptors / Indicators: Moaning Pain Intervention(s): Monitored during session    Home Living                      Prior Function            PT Goals (current goals can now be found in the care plan section) Acute Rehab PT Goals Patient Stated Goal: none stated PT Goal Formulation: With patient Time For Goal Achievement: 06/27/20 Potential to Achieve Goals: Fair Progress towards PT goals: Not progressing toward goals - comment    Frequency    Min 2X/week      PT Plan Current plan remains appropriate    Co-evaluation              AM-PAC PT "6 Clicks" Mobility   Outcome Measure  Help needed turning from your back to your side while in a flat bed without using bedrails?: Total Help needed moving from lying on your back to sitting on the side of a flat bed without using bedrails?: Total Help needed moving to and from a bed to a chair (including a wheelchair)?: Total Help needed standing up from a chair using your arms (e.g., wheelchair or bedside chair)?: Total Help needed to walk in hospital room?: Total Help needed climbing 3-5 steps with a railing? : Total 6 Click Score: 6    End of Session   Activity Tolerance: Patient limited by fatigue;Patient limited by lethargy Patient left: in bed;with call bell/phone within reach;with family/visitor present   PT Visit  Diagnosis: Unsteadiness on feet (R26.81);Difficulty in walking, not elsewhere classified (R26.2);Muscle weakness (generalized) (M62.81)     Time: 1740-8144 PT Time Calculation (min) (ACUTE ONLY): 29 min  Charges:  $Therapeutic Exercise: 8-22 mins $Therapeutic Activity: 8-22 mins                     Minna Merritts, PT, MPT   Percell Locus 06/17/2020, 3:16 PM

## 2020-06-17 NOTE — Progress Notes (Signed)
ID Pt has extensive thoracic space infection and just underwent laminectomy of T3/T4, T5/T7, T9/T10 and evacuation of abscess Continue ceftriaxone 2 grams IV q12 Will need draining of the paraspinal abscess by IR in a day or so Discussed the management with care team

## 2020-06-17 NOTE — Anesthesia Procedure Notes (Signed)
Procedure Name: Intubation Date/Time: 06/17/2020 4:37 PM Performed by: Lily Peer, [place], CRNA Pre-anesthesia Checklist: Patient identified, Emergency Drugs available, Suction available and Patient being monitored Patient Re-evaluated:Patient Re-evaluated prior to induction Oxygen Delivery Method: Circle system utilized Preoxygenation: Pre-oxygenation with 100% oxygen Induction Type: IV induction Laryngoscope Size: McGraph and 3 Grade View: Grade I Tube type: Oral Tube size: 7.0 mm Number of attempts: 1 Airway Equipment and Method: Stylet Placement Confirmation: ETT inserted through vocal cords under direct vision,  positive ETCO2 and breath sounds checked- equal and bilateral Secured at: 20 cm Tube secured with: Tape Dental Injury: Teeth and Oropharynx as per pre-operative assessment

## 2020-06-17 NOTE — Op Note (Signed)
Operative Note  SURGERY DATE:06/17/2020  PRE-OP DIAGNOSIS: Epidural abscess  POST-OP DIAGNOSIS:Post-Op Diagnosis Codes: Epidural abscess  Procedure(s) with comments: T3/4 Laminectomy, T5-7 laminectomy, T9/10 Laminectomy and evacuation of abscess   SURGEON:  * Malen Gauze, MD   ANESTHESIA:General  OPERATIVE FINDINGS:Abscess at T3-T12  OPERATION Indication: Ms Duet was admitted to the hospital and found to have sepsis and meningitis with pneumococcus. She ws seen to have small epidural collections on MRI. She was being treated with antibiotics and found on repeat imaging to have enlarging epidural infection. Given this, surgery for decompression and evacuation was discussed. The daughter agreed to proceed.   Procedure The patient was brought to the OR after informed consent was obtained. She was given general anesthesia and intubated by the anesthesia service. Vascular access lines and foley were placed.The patient was placed prone on Wilson frame ensuring all pressure points were padded. A time-out was performed per protocol.   The patient was sterilely prepped and draped. Fluoroscopy was used to confirm placement of the thoracic incision over the T3/4 lamina, the T5-7 lamina and T9/10 lamina. The skin was opened sharply after installation of anesthetic. The cautery was used to expose the subperiosteal plane of the spinous process and lamina of at each level. Fluoroscopy again confirmed the levels. Next, the drill was used to removed the entire lamina at each incision. Beneath this, the ligament was removed and underneath this a thick abscess was encountered. There was minimal purulent fluid which was cultured. The abscess rind was resected until the dura was seen to be free at each laminectomy defect. Next, suction and irrigation was used to remove infection between each incision, using a small diameter catheter to assist. The thecal sac appeared decompressed  at that time. The lateral gutters were also inspected to remove further abscess. The area was thoroughly irrigated and hemostasis was obtained. A drain was placed through the muscle to exit the skin inferiorly at each incision. Next, 0 Vicryls were used to close the fascia followed by 2-0 Vicryls on the subcutaneous layer. A 3-0 nylon suture was used on the skin.   Dressings were applied. The patient was returned to supine position and extubated.     ESTIMATED BLOOD LOSS: 400cc  SPECIMENS Cultures taken in thoracic wounds  I performed the case in its entirety  Deetta Perla, [place]

## 2020-06-17 NOTE — Transfer of Care (Signed)
Immediate Anesthesia Transfer of Care Note  Patient: Tonya Myers  Procedure(s) Performed: THORACIC LAMINECTOMY FOR EPIDURAL ABSCESS (Bilateral )  Patient Location: PACU  Anesthesia Type:General  Level of Consciousness: drowsy  Airway & Oxygen Therapy: Patient Spontanous Breathing and Patient connected to face mask oxygen  Post-op Assessment: Report given to RN and Post -op Vital signs reviewed and stable  Post vital signs: Reviewed and stable  Last Vitals:  Vitals Value Taken Time  BP 151/89 06/17/20 2018  Temp    Pulse 90 06/17/20 2019  Resp 18 06/17/20 2019  SpO2 99 % 06/17/20 2019  Vitals shown include unvalidated device data.  Last Pain:  Vitals:   06/17/20 1146  TempSrc: Oral  PainSc:       Patients Stated Pain Goal: 2 (16/55/37 4827)  Complications: No complications documented.

## 2020-06-17 NOTE — Progress Notes (Signed)
Inpatient Diabetes Program Recommendations  AACE/ADA: New Consensus Statement on Inpatient Glycemic Control (2015)  Target Ranges:  Prepandial:   less than 140 mg/dL      Peak postprandial:   less than 180 mg/dL (1-2 hours)      Critically ill patients:  140 - 180 mg/dL   Results for JAINE, ESTABROOKS (MRN [id number]) as of 06/17/2020 13:48  Ref. Range 06/16/2020 07:51 06/16/2020 12:34 06/16/2020 16:20 06/16/2020 20:35  Glucose-Capillary Latest Ref Range: 70 - 99 mg/dL 258 (H) 287 (H) 350 (H) 279 (H)   Results for AHLIA, LEMANSKI (MRN [id number]) as of 06/17/2020 13:48  Ref. Range 06/17/2020 08:10 06/17/2020 11:32  Glucose-Capillary Latest Ref Range: 70 - 99 mg/dL 240 (H) 293 (H)    Home DM Meds: Lantus/Lixienatide 48 units Daily                             Humalog 10 units tid meal coverage                             Metformin 500 mg BID  Current Orders: Lantus 24 units Daily                            Novolog Sensitive Correction Scale/ SSI (0-9 units) TID AC + HS     MD- Note CBG 240 this AM.  Afternoon CBGs elevated yesterday as well.  Please consider:  1. Increase Lantus to 30 units Daily  2. Increase Novolog SSi to the Moderate 0-15 unit scale    --Will follow patient during hospitalization--  Wyn Quaker RN, MSN, CDE Diabetes Coordinator Inpatient Glycemic Control Team Team Pager: [phone] (8a-5p)

## 2020-06-17 NOTE — Progress Notes (Signed)
Chief Complaint: Patient was seen in consultation today for paraspinous abscess  Referring Physician(s): Dr. Earleen Newport  Supervising Physician: Mir, Sharen Heck  Patient Status: [facility] - In-pt  History of Present Illness: Tonya Myers is a 55 y.o. 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 She also continues to have back pain and has had progressive development of lumbar paraspinous abscess. IR is asked to perform image guided aspiration/drainage. PMHx, meds, labs, imaging, allergies reviewed. Had small amount of breakfast but has been NPO since about 0900. Family at bedside.   Past Medical History:  Diagnosis Date  . Acute hemorrhoid 03/11/2015  . Anxiety   . Arthritis   . Asthma   . Cancer ([place])   . Chronic back pain   . CLL (chronic lymphocytic leukemia) ([place])   . Depression   . Diabetes mellitus without complication ([place])   . Genital herpes    type 2  . Hypertension   . Hypothyroidism   . Vertigo     Past Surgical History:  Procedure Laterality Date  . ABDOMINAL HYSTERECTOMY    . BILATERAL SALPINGOOPHORECTOMY  2009  . BREAST BIOPSY Right 05/17/2016   FIBROADENOMATOUS CHANGE AND SCLEROSING ADENOSIS WITH  . COLONOSCOPY WITH PROPOFOL N/A 06/16/2015   Procedure: COLONOSCOPY WITH PROPOFOL;  Surgeon: [name], MD;  Location: [facility] ENDOSCOPY;  Service: Endoscopy;  Laterality: N/A;  . LAPAROSCOPIC SUPRACERVICAL HYSTERECTOMY  2009   due to [place]  . OOPHORECTOMY      Allergies: Patient has no known allergies.  Medications:  Current Facility-Administered Medications:  .  acetaminophen (TYLENOL) tablet 650 mg, 650 mg, Oral, Q6H PRN, 650 mg at 06/16/20 1255 **OR** acetaminophen (TYLENOL) suppository 650 mg, 650 mg, Rectal, Q6H PRN, Marcelyn Bruins, MD, 650 mg at 06/14/20 0931 .  albuterol (VENTOLIN HFA) 108 (90 Base) MCG/ACT inhaler 1 puff, 1 puff, Inhalation, Q6H PRN, Marcelyn Bruins, MD .  amLODipine (NORVASC) tablet 5 mg, 5  mg, Oral, Daily, Leslye Peer, Richard, MD, 5 mg at 06/17/20 0836 .  cefTRIAXone (ROCEPHIN) 2 g in sodium chloride 0.9 % 100 mL IVPB, 2 g, Intravenous, Q12H, Ravishankar, Jayashree, MD, Last Rate: 200 mL/hr at 06/17/20 0647, 2 g at 06/17/20 0647 .  chlorhexidine (PERIDEX) 0.12 % solution 15 mL, 15 mL, Mouth Rinse, BID, Sharion Settler, NP, 15 mL at 06/17/20 0837 .  Chlorhexidine Gluconate Cloth 2 % PADS 6 each, 6 each, Topical, Q0600, Sharion Settler, NP, 6 each at 06/17/20 [phone] .  dextrose 50 % solution 0-50 mL, 0-50 mL, Intravenous, PRN, Marcelyn Bruins, MD .  gabapentin (NEURONTIN) capsule 200 mg, 200 mg, Oral, BID, Loletha Grayer, MD, 200 mg at 06/17/20 0836 .  gadobutrol (GADAVIST) 1 MMOL/ML injection 10 mL, 10 mL, Intravenous, Once PRN, Kerney Elbe, MD .  hydrochlorothiazide (MICROZIDE) capsule 12.5 mg, 12.5 mg, Oral, Daily, Leslye Peer, Richard, MD, 12.5 mg at 06/17/20 0836 .  insulin aspart (novoLOG) injection 0-5 Units, 0-5 Units, Subcutaneous, QHS, Loletha Grayer, MD, 3 Units at 06/16/20 2242 .  insulin aspart (novoLOG) injection 0-9 Units, 0-9 Units, Subcutaneous, TID WC, Loletha Grayer, MD, 3 Units at 06/17/20 [phone] .  insulin glargine (LANTUS) injection 24 Units, 24 Units, Subcutaneous, Daily, Loletha Grayer, MD, 24 Units at 06/17/20 [phone] .  ketorolac (TORADOL) 15 MG/ML injection 15 mg, 15 mg, Intravenous, Q6H PRN, Ottie Glazier, MD, 15 mg at 06/17/20 0140 .  labetalol (NORMODYNE) injection 10 mg, 10 mg, Intravenous, Q2H PRN, Sharion Settler, NP, 10 mg at 06/16/20 0954 .  levothyroxine (SYNTHROID) tablet  150 mcg, 150 mcg, Oral, Q0600, Marcelyn Bruins, MD, 150 mcg at 06/17/20 [phone] .  losartan (COZAAR) tablet 100 mg, 100 mg, Oral, Daily, Leslye Peer, Richard, MD, 100 mg at 06/17/20 0836 .  magnesium oxide (MAG-OX) tablet 400 mg, 400 mg, Oral, Daily, Leslye Peer, Richard, MD, 400 mg at 06/17/20 0837 .  MEDLINE mouth rinse, 15 mL, Mouth Rinse, q12n4p, Sharion Settler, NP, 15 mL at  06/16/20 1713 .  oxyCODONE (Oxy IR/ROXICODONE) immediate release tablet 5 mg, 5 mg, Oral, Q4H PRN, Loletha Grayer, MD, 5 mg at 06/16/20 1123 .  pantoprazole (PROTONIX) injection 40 mg, 40 mg, Intravenous, Q24H, Wieting, Richard, MD, 40 mg at 06/16/20 1303 .  polyethylene glycol (MIRALAX / GLYCOLAX) packet 17 g, 17 g, Oral, Daily PRN, Marcelyn Bruins, MD .  potassium chloride (KLOR-CON) packet 20 mEq, 20 mEq, Oral, Daily, Loletha Grayer, MD, 20 mEq at 06/17/20 0844 .  [COMPLETED] sodium chloride 0.9 % 50 mL with thiamine (B-1) 500 mg infusion, , Intravenous, Q8H, Stopped at 06/16/20 1803 **FOLLOWED BY** sodium chloride 0.9 % 25 mL with thiamine (B-1) 250 mg infusion, , Intravenous, Daily, Last Rate: 50 mL/hr at 06/17/20 0852, New Bag at 06/17/20 0852 **FOLLOWED BY** [START ON 06/19/2020] thiamine (B-1) injection 100 mg, 100 mg, Intravenous, Daily, Chappell, Alex B, RPH .  sodium chloride flush (NS) 0.9 % injection 10-40 mL, 10-40 mL, Intracatheter, Q12H, Wieting, Richard, MD, 10 mL at 06/17/20 0843 .  sodium chloride flush (NS) 0.9 % injection 10-40 mL, 10-40 mL, Intracatheter, PRN, Loletha Grayer, MD, 10 mL at 06/13/20 2210 .  traZODone (DESYREL) tablet 50 mg, 50 mg, Oral, QHS, Wieting, Richard, MD, 50 mg at 06/16/20 2242    Family History  Problem Relation Age of Onset  . Cancer Paternal Aunt   . Breast cancer Maternal Aunt 70    Social History   Socioeconomic History  . Marital status: Single    Spouse name: Not on file  . Number of children: Not on file  . Years of education: Not on file  . Highest education level: Not on file  Occupational History  . Not on file  Tobacco Use  . Smoking status: Never Smoker  . Smokeless tobacco: Never Used  Vaping Use  . Vaping Use: Never used  Substance and Sexual Activity  . Alcohol use: Yes    Alcohol/week: 1.0 standard drink    Types: 1 Cans of beer per week  . Drug use: No  . Sexual activity: Not Currently    Birth  control/protection: None  Other Topics Concern  . Not on file  Social History Narrative  . Not on file   Social Determinants of Health   Financial Resource Strain: Not on file  Food Insecurity: Not on file  Transportation Needs: Not on file  Physical Activity: Not on file  Stress: Not on file  Social Connections: Not on file    Review of Systems: A 12 point ROS discussed and pertinent positives are indicated in the HPI above.  All other systems are negative.  Review of Systems  Vital Signs: BP (!) 190/96 (BP Location: Left Wrist)   Pulse 94   Temp 98.7 F (37.1 C) (Oral)   Resp 17   Ht 6' (1.829 m)   Wt 109.1 kg   SpO2 96%   BMI 32.62 kg/m   Physical Exam Constitutional:      Appearance: She is obese. She is not ill-appearing or toxic-appearing.  HENT:     Mouth/Throat:  Mouth: Mucous membranes are moist.     Pharynx: Oropharynx is clear.  Cardiovascular:     Rate and Rhythm: Normal rate and regular rhythm.     Heart sounds: Normal heart sounds.  Pulmonary:     Effort: Pulmonary effort is normal. No respiratory distress.     Breath sounds: Normal breath sounds.  Skin:    General: Skin is warm and dry.  Neurological:     Mental Status: She is disoriented.     Comments: Awake and conversant but not fully engaged       Imaging: EEG  Result Date: 06/12/2020 Lora Havens, MD     06/12/2020  8:56 AM Patient Name: Lakitha Gordy MRN: [id number] Epilepsy Attending: Lora Havens Referring Physician/Provider: Dr. Neva Seat Date: 06/11/2020 Duration: 29.31 mins Patient history: 55 year old female with encephalopathy and pneumococcal meningitis.  EEG to eval for seizures. Level of alertness: Awake, asleep AEDs during EEG study: None Technical aspects: This EEG study was done with scalp electrodes positioned according to the 10-20 International system of electrode placement. Electrical activity was acquired at a sampling rate of 500Hz  and reviewed with a  high frequency filter of 70Hz  and a low frequency filter of 1Hz . EEG data were recorded continuously and digitally stored. Description: No clear posterior dominant rhythm was seen. Sleep was characterized by vertex waves, sleep spindles (12 to 14 Hz), maximal frontocentral region.  EEG showed continuous generalized 3 to 6 Hz theta-delta slowing. Hyperventilation and photic stimulation were not performed.   ABNORMALITY -Continuous slow, generalized IMPRESSION: This study is suggestive of moderate diffuse encephalopathy, nonspecific etiology. No seizures or epileptiform discharges were seen throughout the recording. [place] Foreign Body  Result Date: 06/10/2020 CLINICAL DATA:  Metal working/exposure; clearance prior to MRI EXAM: ORBITS FOR FOREIGN BODY - 2 VIEW COMPARISON:  None. FINDINGS: There is no evidence of metallic foreign body within the orbits. No significant bone abnormality identified. IMPRESSION: No evidence of metallic foreign body within the orbits. Electronically Signed   By: Ulyses Jarred M.D.   On: 06/10/2020 00:26   DG Chest 2 View  Result Date: 06/09/2020 CLINICAL DATA:  Shortness of breath EXAM: CHEST - 2 VIEW COMPARISON:  05/23/2020 FINDINGS: Heart and mediastinal contours are within normal limits. No focal opacities or effusions. No acute bony abnormality. IMPRESSION: No active cardiopulmonary disease. Electronically Signed   By: Rolm Baptise M.D.   On: 06/09/2020 12:25   DG Pelvis 1-2 Views  Result Date: 06/10/2020 CLINICAL DATA:  MRI clearance EXAM: PELVIS - 1-2 VIEW COMPARISON:  None. FINDINGS: There is no evidence of pelvic fracture or diastasis. No pelvic bone lesions are seen. IMPRESSION: No metallic foreign body. Electronically Signed   By: Ulyses Jarred M.D.   On: 06/10/2020 00:26   DG Abd 1 View  Result Date: 06/10/2020 CLINICAL DATA:  MRI clearance EXAM: ABDOMEN - 1 VIEW COMPARISON:  None. FINDINGS: The bowel gas pattern is normal. No radio-opaque  calculi or other significant radiographic abnormality are seen. IMPRESSION: No metallic foreign body. Electronically Signed   By: Ulyses Jarred M.D.   On: 06/10/2020 00:27   CT HEAD WO CONTRAST  Result Date: 06/11/2020 CLINICAL DATA:  Sepsis.  Meningitis hydrocephalus. EXAM: CT HEAD WITHOUT CONTRAST TECHNIQUE: Contiguous axial images were obtained from the base of the skull through the vertex without intravenous contrast. COMPARISON:  Head CT 06/09/2020.  MRI 06/10/2020. FINDINGS: Brain: The brain shows a normal appearance without evidence of malformation, atrophy,  old or acute small or large vessel infarction, mass lesion, hemorrhage, hydrocephalus or extra-axial collection. Vascular: No hyperdense vessel. No evidence of atherosclerotic calcification. Skull: Normal.  No traumatic finding.  No focal bone lesion. Sinuses/Orbits: Sinuses are clear. Orbits appear normal. Mastoids are clear. Other: None significant IMPRESSION: Normal head CT. Electronically Signed   By: Nelson Chimes M.D.   On: 06/11/2020 15:47   CT Head Wo Contrast  Result Date: 06/09/2020 CLINICAL DATA:  Delirium EXAM: CT HEAD WITHOUT CONTRAST TECHNIQUE: Contiguous axial images were obtained from the base of the skull through the vertex without intravenous contrast. COMPARISON:  None. FINDINGS: Brain: No evidence of large-territorial acute infarction. No parenchymal hemorrhage. No mass lesion. No extra-axial collection. No mass effect or midline shift. No hydrocephalus. Basilar cisterns are patent. Vascular: No hyperdense vessel. Skull: No acute fracture or focal lesion. Sinuses/Orbits: Paranasal sinuses and mastoid air cells are clear. The orbits are unremarkable. Other: None. IMPRESSION: No acute intracranial abnormality. Electronically Signed   By: Iven Finn M.D.   On: 06/09/2020 22:19   MR BRAIN WO CONTRAST  Result Date: 06/10/2020 CLINICAL DATA:  Altered mental status. Possible drug overdose. Seroquel. EXAM: MRI HEAD WITHOUT  CONTRAST TECHNIQUE: Multiplanar, multiecho pulse sequences of the brain and surrounding structures were obtained without intravenous contrast. COMPARISON:  None. FINDINGS: Brain: No acute infarct, mass effect or extra-axial collection. No acute or chronic hemorrhage. Normal white matter signal, parenchymal volume and CSF spaces. The midline structures are normal. Vascular: Major flow voids are preserved. Skull and upper cervical spine: Normal calvarium and skull base. Visualized upper cervical spine and soft tissues are normal. Sinuses/Orbits:No paranasal sinus fluid levels or advanced mucosal thickening. No mastoid or middle ear effusion. Normal orbits. IMPRESSION: Normal brain MRI. Electronically Signed   By: Ulyses Jarred M.D.   On: 06/10/2020 03:42   MR CERVICAL SPINE WO CONTRAST  Result Date: 06/11/2020 CLINICAL DATA:  Pneumococcal meningitis. Neck and back pain. Evaluate for epidural abscess. EXAM: MRI CERVICAL, THORACIC AND LUMBAR SPINE WITHOUT CONTRAST TECHNIQUE: Multiplanar and multiecho pulse sequences of the cervical spine, to include the craniocervical junction and cervicothoracic junction, and thoracic and lumbar spine, were obtained without intravenous contrast. COMPARISON:  None FINDINGS: MRI CERVICAL SPINE FINDINGS Limited examination due to significant patient motion. Alignment: Normal alignment. Vertebrae: Grossly normal marrow signal. No bone lesions or fractures. No findings suspicious for discitis or osteomyelitis. Cord: Grossly normal cord signal intensity. No cord lesion or syrinx. Posterior Fossa, vertebral arteries, paraspinal tissues: No significant findings. Disc levels: C2-3: Shallow broad-based disc protrusion with mass effect on the ventral thecal sac. No significant foraminal stenosis. C3-4: Large central and left paracentral disc protrusion with mass effect on the thecal sac and flattening of the cervical cord. Mild foraminal stenosis bilaterally also. C4-5: Bulging degenerated  annulus, osteophytic ridging and left paracentral disc protrusion with mass effect on the thecal sac. There is also mild bilateral foraminal stenosis. C5-6: Degenerated bulging annulus, central disc protrusion and facet disease with mild bilateral foraminal stenosis. C6-7: Bulging degenerated annulus and osteophytic ridging with flattening of the ventral thecal sac asymmetric left. Mild left foraminal stenosis. C7-T1: Bulging degenerated annulus and left paracentral disc protrusion with mild mass effect on the left side of thecal sac. No significant foraminal stenosis. MRI THORACIC SPINE FINDINGS Alignment:  Normal alignment of the thoracic vertebral bodies. Vertebrae: Normal marrow signal. Small scattered hemangiomas but no worrisome bone lesions or fractures. No findings suspicious for discitis or osteomyelitis. The facets are normally aligned. No  facet or laminar fractures. No evidence of facet septic arthritis. Cord: Grossly normal thoracic spinal cord. No obvious cord lesions or syrinx. Paraspinal and other soft tissues: No significant paraspinal findings. Disc levels: Small central disc protrusion at T6-7. MRI LUMBAR SPINE FINDINGS Segmentation: There are five lumbar type vertebral bodies. The last full intervertebral disc space is labeled L5-S1. Alignment:  Normal Vertebrae: No bone lesions or fractures. No findings suspicious for septic arthritis or osteomyelitis. Endplate reactive changes at L5-S1. Significant facet arthropathy noted on the left side at L2-3 and L3-4 with findings consistent with septic arthritis. Associated abscesses in the paraspinal muscles associated with both these levels. There is also significant surrounding myositis. Findings suspicious for a small epidural abscess on the left side at C2-3 associated with the infected left facet joint. No significant mass effect on the thecal sac. Conus medullaris and cauda equina: Conus extends to the L1-2 level. Conus and cauda equina appear  normal. Paraspinal and other soft tissues: Abscesses in the left paraspinal muscles at L2, L3 and L4. Disc levels: L1-2: No significant findings. L2-3: No findings for discitis or osteomyelitis. Small posterior epidural abscess on the left side associated with the septic arthritis in the left L2-3 facet joint. No disc protrusions, spinal or foraminal stenosis. L3-4: No disc protrusions, spinal or foraminal stenosis. Septic arthritis involving the left L3-4 facet joint with an adjacent soft tissue abscess measuring approximately 18 x 15 mm. Smaller adjacent abscesses are also noted in the left paraspinal muscle. L4-5: No evidence of discitis or osteomyelitis. There is a shallow central disc protrusion with mass effect on the ventral thecal sac and mild bilateral lateral recess stenosis. No foraminal stenosis. The facet joints are intact at this level. There are some small abscesses in the left paraspinal muscle extending down just below the L4-5 disc space. L5-S1: Degenerative disc disease with endplate reactive changes. There is a shallow broad-based disc protrusion asymmetric left with mass effect on the thecal sac and on the left S1 nerve root. No foraminal stenosis. No septic arthritis involving the facet joints. IMPRESSION: 1. MR findings consistent with septic arthritis involving the left L2-3 and L3-4 facet joints with associated abscess is in the left paraspinal muscles. 2. Suspect small epidural abscess posteriorly at L2-3 on the left side. 3. No definite MR findings for discitis or osteomyelitis in the cervical, thoracic or lumbar spines. 4. Degenerative cervical spondylosis with multilevel disc disease and facet disease. Multilevel disc protrusions most significant at C3-4. Electronically Signed   By: Marijo Sanes M.D.   On: 06/11/2020 15:06   MR THORACIC SPINE WO CONTRAST  Result Date: 06/11/2020 CLINICAL DATA:  Pneumococcal meningitis. Neck and back pain. Evaluate for epidural abscess. EXAM: MRI  CERVICAL, THORACIC AND LUMBAR SPINE WITHOUT CONTRAST TECHNIQUE: Multiplanar and multiecho pulse sequences of the cervical spine, to include the craniocervical junction and cervicothoracic junction, and thoracic and lumbar spine, were obtained without intravenous contrast. COMPARISON:  None FINDINGS: MRI CERVICAL SPINE FINDINGS Limited examination due to significant patient motion. Alignment: Normal alignment. Vertebrae: Grossly normal marrow signal. No bone lesions or fractures. No findings suspicious for discitis or osteomyelitis. Cord: Grossly normal cord signal intensity. No cord lesion or syrinx. Posterior Fossa, vertebral arteries, paraspinal tissues: No significant findings. Disc levels: C2-3: Shallow broad-based disc protrusion with mass effect on the ventral thecal sac. No significant foraminal stenosis. C3-4: Large central and left paracentral disc protrusion with mass effect on the thecal sac and flattening of the cervical cord. Mild foraminal stenosis  bilaterally also. C4-5: Bulging degenerated annulus, osteophytic ridging and left paracentral disc protrusion with mass effect on the thecal sac. There is also mild bilateral foraminal stenosis. C5-6: Degenerated bulging annulus, central disc protrusion and facet disease with mild bilateral foraminal stenosis. C6-7: Bulging degenerated annulus and osteophytic ridging with flattening of the ventral thecal sac asymmetric left. Mild left foraminal stenosis. C7-T1: Bulging degenerated annulus and left paracentral disc protrusion with mild mass effect on the left side of thecal sac. No significant foraminal stenosis. MRI THORACIC SPINE FINDINGS Alignment:  Normal alignment of the thoracic vertebral bodies. Vertebrae: Normal marrow signal. Small scattered hemangiomas but no worrisome bone lesions or fractures. No findings suspicious for discitis or osteomyelitis. The facets are normally aligned. No facet or laminar fractures. No evidence of facet septic arthritis.  Cord: Grossly normal thoracic spinal cord. No obvious cord lesions or syrinx. Paraspinal and other soft tissues: No significant paraspinal findings. Disc levels: Small central disc protrusion at T6-7. MRI LUMBAR SPINE FINDINGS Segmentation: There are five lumbar type vertebral bodies. The last full intervertebral disc space is labeled L5-S1. Alignment:  Normal Vertebrae: No bone lesions or fractures. No findings suspicious for septic arthritis or osteomyelitis. Endplate reactive changes at L5-S1. Significant facet arthropathy noted on the left side at L2-3 and L3-4 with findings consistent with septic arthritis. Associated abscesses in the paraspinal muscles associated with both these levels. There is also significant surrounding myositis. Findings suspicious for a small epidural abscess on the left side at C2-3 associated with the infected left facet joint. No significant mass effect on the thecal sac. Conus medullaris and cauda equina: Conus extends to the L1-2 level. Conus and cauda equina appear normal. Paraspinal and other soft tissues: Abscesses in the left paraspinal muscles at L2, L3 and L4. Disc levels: L1-2: No significant findings. L2-3: No findings for discitis or osteomyelitis. Small posterior epidural abscess on the left side associated with the septic arthritis in the left L2-3 facet joint. No disc protrusions, spinal or foraminal stenosis. L3-4: No disc protrusions, spinal or foraminal stenosis. Septic arthritis involving the left L3-4 facet joint with an adjacent soft tissue abscess measuring approximately 18 x 15 mm. Smaller adjacent abscesses are also noted in the left paraspinal muscle. L4-5: No evidence of discitis or osteomyelitis. There is a shallow central disc protrusion with mass effect on the ventral thecal sac and mild bilateral lateral recess stenosis. No foraminal stenosis. The facet joints are intact at this level. There are some small abscesses in the left paraspinal muscle extending  down just below the L4-5 disc space. L5-S1: Degenerative disc disease with endplate reactive changes. There is a shallow broad-based disc protrusion asymmetric left with mass effect on the thecal sac and on the left S1 nerve root. No foraminal stenosis. No septic arthritis involving the facet joints. IMPRESSION: 1. MR findings consistent with septic arthritis involving the left L2-3 and L3-4 facet joints with associated abscess is in the left paraspinal muscles. 2. Suspect small epidural abscess posteriorly at L2-3 on the left side. 3. No definite MR findings for discitis or osteomyelitis in the cervical, thoracic or lumbar spines. 4. Degenerative cervical spondylosis with multilevel disc disease and facet disease. Multilevel disc protrusions most significant at C3-4. Electronically Signed   By: Marijo Sanes M.D.   On: 06/11/2020 15:06   MR LUMBAR SPINE WO CONTRAST  Result Date: 06/16/2020 CLINICAL DATA:  Pneumococcal meningitis. Follow-up lumbar spine infection. New fever today. EXAM: MRI LUMBAR SPINE WITHOUT CONTRAST TECHNIQUE: Multiplanar, multisequence MR  imaging of the lumbar spine was performed. No intravenous contrast was administered. COMPARISON:  Lumbar MRI 06/11/2020 FINDINGS: Segmentation:  Normal Alignment:  Normal Vertebrae: No evidence of discitis. No fracture or bone marrow edema. Conus medullaris and cauda equina: Conus extends to the L1-2 level. Conus and cauda equina appear normal. Paraspinal and other soft tissues: Posterior paraspinous muscle edema and multiple abscesses have progressed in the interval. Fluid collections are present in the muscles which appear to be associated with the facet joints on the left at L2-3 and L3-4. Abscesses appears slightly larger with increased surrounding edema. No right-sided abscess identified. Small epidural abscess posterior on the left at L2-3 is unchanged. Disc levels: L1-2: Negative L2-3: Shallow central disc protrusion with mild spinal stenosis. Fluid  in the left facet joint which is contiguous with a posterior paraspinous muscle abscess. Small posterior epidural abscess on the left unchanged. L3-4: Shallow ventral epidural fluid collection has progressed compatible with abscess. Bilateral facet degeneration. Fluid in the left facet joint appears contiguous with the posterior paraspinous muscle abscess. No significant spinal stenosis L4-5: Shallow central disc protrusion. Bilateral facet degeneration. Mild subarticular stenosis bilaterally L5-S1: Moderate disc degeneration with disc space narrowing. Shallow central and left-sided disc protrusion unchanged. Possible left S1 nerve root impingement. Bilateral mild facet degeneration. IMPRESSION: Findings compatible with lumbar spinal infection. There appears to be septic arthritis in the left L2-3 and L3-4 facets with adjacent paraspinous abscesses. Paraspinous abscesses and muscle edema appear mildly progressive. Ventral epidural abscess at L3-4 is small but appears mildly progressive. Small posterior epidural abscess on the left at L2-3 is unchanged. Electronically Signed   By: Franchot Gallo M.D.   On: 06/16/2020 16:06   MR LUMBAR SPINE WO CONTRAST  Result Date: 06/11/2020 CLINICAL DATA:  Pneumococcal meningitis. Neck and back pain. Evaluate for epidural abscess. EXAM: MRI CERVICAL, THORACIC AND LUMBAR SPINE WITHOUT CONTRAST TECHNIQUE: Multiplanar and multiecho pulse sequences of the cervical spine, to include the craniocervical junction and cervicothoracic junction, and thoracic and lumbar spine, were obtained without intravenous contrast. COMPARISON:  None FINDINGS: MRI CERVICAL SPINE FINDINGS Limited examination due to significant patient motion. Alignment: Normal alignment. Vertebrae: Grossly normal marrow signal. No bone lesions or fractures. No findings suspicious for discitis or osteomyelitis. Cord: Grossly normal cord signal intensity. No cord lesion or syrinx. Posterior Fossa, vertebral arteries,  paraspinal tissues: No significant findings. Disc levels: C2-3: Shallow broad-based disc protrusion with mass effect on the ventral thecal sac. No significant foraminal stenosis. C3-4: Large central and left paracentral disc protrusion with mass effect on the thecal sac and flattening of the cervical cord. Mild foraminal stenosis bilaterally also. C4-5: Bulging degenerated annulus, osteophytic ridging and left paracentral disc protrusion with mass effect on the thecal sac. There is also mild bilateral foraminal stenosis. C5-6: Degenerated bulging annulus, central disc protrusion and facet disease with mild bilateral foraminal stenosis. C6-7: Bulging degenerated annulus and osteophytic ridging with flattening of the ventral thecal sac asymmetric left. Mild left foraminal stenosis. C7-T1: Bulging degenerated annulus and left paracentral disc protrusion with mild mass effect on the left side of thecal sac. No significant foraminal stenosis. MRI THORACIC SPINE FINDINGS Alignment:  Normal alignment of the thoracic vertebral bodies. Vertebrae: Normal marrow signal. Small scattered hemangiomas but no worrisome bone lesions or fractures. No findings suspicious for discitis or osteomyelitis. The facets are normally aligned. No facet or laminar fractures. No evidence of facet septic arthritis. Cord: Grossly normal thoracic spinal cord. No obvious cord lesions or syrinx. Paraspinal and other  soft tissues: No significant paraspinal findings. Disc levels: Small central disc protrusion at T6-7. MRI LUMBAR SPINE FINDINGS Segmentation: There are five lumbar type vertebral bodies. The last full intervertebral disc space is labeled L5-S1. Alignment:  Normal Vertebrae: No bone lesions or fractures. No findings suspicious for septic arthritis or osteomyelitis. Endplate reactive changes at L5-S1. Significant facet arthropathy noted on the left side at L2-3 and L3-4 with findings consistent with septic arthritis. Associated abscesses in  the paraspinal muscles associated with both these levels. There is also significant surrounding myositis. Findings suspicious for a small epidural abscess on the left side at C2-3 associated with the infected left facet joint. No significant mass effect on the thecal sac. Conus medullaris and cauda equina: Conus extends to the L1-2 level. Conus and cauda equina appear normal. Paraspinal and other soft tissues: Abscesses in the left paraspinal muscles at L2, L3 and L4. Disc levels: L1-2: No significant findings. L2-3: No findings for discitis or osteomyelitis. Small posterior epidural abscess on the left side associated with the septic arthritis in the left L2-3 facet joint. No disc protrusions, spinal or foraminal stenosis. L3-4: No disc protrusions, spinal or foraminal stenosis. Septic arthritis involving the left L3-4 facet joint with an adjacent soft tissue abscess measuring approximately 18 x 15 mm. Smaller adjacent abscesses are also noted in the left paraspinal muscle. L4-5: No evidence of discitis or osteomyelitis. There is a shallow central disc protrusion with mass effect on the ventral thecal sac and mild bilateral lateral recess stenosis. No foraminal stenosis. The facet joints are intact at this level. There are some small abscesses in the left paraspinal muscle extending down just below the L4-5 disc space. L5-S1: Degenerative disc disease with endplate reactive changes. There is a shallow broad-based disc protrusion asymmetric left with mass effect on the thecal sac and on the left S1 nerve root. No foraminal stenosis. No septic arthritis involving the facet joints. IMPRESSION: 1. MR findings consistent with septic arthritis involving the left L2-3 and L3-4 facet joints with associated abscess is in the left paraspinal muscles. 2. Suspect small epidural abscess posteriorly at L2-3 on the left side. 3. No definite MR findings for discitis or osteomyelitis in the cervical, thoracic or lumbar spines. 4.  Degenerative cervical spondylosis with multilevel disc disease and facet disease. Multilevel disc protrusions most significant at C3-4. Electronically Signed   By: Marijo Sanes M.D.   On: 06/11/2020 15:06   CT ABDOMEN PELVIS W CONTRAST  Result Date: 05/23/2020 CLINICAL DATA:  Left flank pain radiating down both legs. Dizziness. EXAM: CT ABDOMEN AND PELVIS WITH CONTRAST TECHNIQUE: Multidetector CT imaging of the abdomen and pelvis was performed using the standard protocol following bolus administration of intravenous contrast. CONTRAST:  175mL OMNIPAQUE IOHEXOL 300 MG/ML  SOLN COMPARISON:  Abdominal ultrasound 01/15/2020 and CT abdomen from 04/13/2016 FINDINGS: Lower chest: Irregular volume loss and some consolidation in an approximately 6.5 by 3.4 by 5.8 cm region of the posterior basal segment right lower lobe. Given the volume loss some of this is probably due to atelectasis but a component of broncho pneumonia is not excluded. Hepatobiliary: A mildly hypodense lesion in the right hepatic lobe measuring 1.7 by 1.6 cm is present on image 22 of series 2 was not readily apparent on the prior exam of 04/13/2016. This lesion is not readily apparent on the delayed images. An adjacent mildly hypodense right hepatic lobe lesion measuring 1.5 by 1.1 cm is present on image 24 series 2. This lesion appears mildly  hyperdense to the liver on the delayed images such image 8 of series 7, making it a little bit more likely to represent a hemangioma. Overall, however, these lesions are technically nonspecific. The gallbladder appears unremarkable.  No biliary dilatation. Pancreas: Unremarkable Spleen: Questionable 2.4 cm hypodense lesion inferiorly in the spleen on image 31 of series 2, admittedly this finding is very subtle. Adrenals/Urinary Tract: Nonspecific 1.0 by 0.7 cm hypodense lesion in the left mid kidney on image 20 of series 7, previously 0.7 by 0.5 cm back on 04/13/2016. The kidneys appear otherwise unremarkable.  Adrenal glands normal. Stomach/Bowel: Unremarkable Vascular/Lymphatic: Mild aortoiliac atherosclerotic vascular disease. Small retroperitoneal lymph nodes are not pathologically enlarged by size criteria. Similarly, small pelvic lymph nodes are not pathologically enlarged. Reproductive: Prior supracervical hysterectomy. Adnexa unremarkable. Other: No supplemental non-categorized findings. Musculoskeletal: Lower lumbar spondylosis and degenerative disc disease. Small umbilical hernia contains adipose tissue. IMPRESSION: 1. Irregular volume loss and some consolidation in an approximately 6.5 by 3.4 by 5.8 cm region of the posterior basal segment right lower lobe. Given the volume loss some of this is probably due to atelectasis but a component of broncho pneumonia is not excluded. 2. Very subtle hypodense lesion in the inferior spleen. Given the history of chronic lymphocytic leukemia, and also given the indeterminate hypodense hepatic lesions which may be new compared to the prior exam, I would recommend nonurgent hepatic protocol MRI with and without contrast further investigate the hepatic and splenic lesions. 3. Nonspecific 1.0 by 0.7 cm hypodense lesion in the left mid kidney has mildly enlarged compared to 04/13/2016. This is probably a cyst but could also be further assessed at MRI. 4. Lower lumbar spondylosis and degenerative disc disease. 5. Small umbilical hernia contains adipose tissue. 6. Aortic atherosclerosis. Aortic Atherosclerosis (ICD10-I70.0). Electronically Signed   By: Van Clines M.D.   On: 05/23/2020 20:18   DG Chest Port 1 View  Result Date: 06/16/2020 CLINICAL DATA:  Fever. EXAM: PORTABLE CHEST 1 VIEW COMPARISON:  06/09/2020 FINDINGS: The cardiomediastinal silhouette is within normal limits for portable AP technique. Lung volumes are low, and there are new mild opacities in both lung bases. No sizable pleural effusion or pneumothorax is identified. A new right PICC terminates over  the SVC. IMPRESSION: Low lung volumes with mild bibasilar opacities favoring atelectasis although pneumonia is not excluded. Electronically Signed   By: Logan Bores M.D.   On: 06/16/2020 13:43   DG Chest Portable 1 View  Result Date: 05/23/2020 CLINICAL DATA:  Cough EXAM: PORTABLE CHEST 1 VIEW COMPARISON:  03/15/2019 FINDINGS: The heart size and mediastinal contours are within normal limits. Both lungs are clear. The visualized skeletal structures are unremarkable. IMPRESSION: No active disease. Electronically Signed   By: Donavan Foil M.D.   On: 05/23/2020 17:31   ECHOCARDIOGRAM COMPLETE  Result Date: 06/12/2020    ECHOCARDIOGRAM REPORT   Patient Name:   KEHLANI VANCAMP Date of Exam: 06/11/2020 Medical Rec #:  [id number]       Height:       72.0 in Accession #:    [id number]      Weight:       245.0 lb Date of Birth:  August 21, 1965       BSA:          2.322 m Patient Age:    73 years        BP:           126/73 mmHg Patient Gender: F  HR:           75 bpm. Exam Location:  [facility] Procedure: 2D Echo, Cardiac Doppler and Color Doppler Indications:     R78.81 Bacteremia  History:         Patient has no prior history of Echocardiogram examinations.                  Risk Factors:Hypertension and Diabetes. Hypothyroidism.  Sonographer:     Wilford Sports Rodgers-Jones Referring Phys:  [id number] [name] Diagnosing Phys: Ida Rogue MD IMPRESSIONS  1. Left ventricular ejection fraction, by estimation, is 50 to 55%. The left ventricle has low normal function. The left ventricle has no regional wall motion abnormalities. There is mild left ventricular hypertrophy. Left ventricular diastolic parameters were normal.  2. Right ventricular systolic function is normal. The right ventricular size is normal. There is normal pulmonary artery systolic pressure. The estimated right ventricular systolic pressure is 37.1 mmHg.  3. The mitral valve is normal in structure. Mild mitral valve regurgitation. No  evidence of mitral stenosis.  4. The aortic valve is grossly normal in structure, though difficult to fully visualize. Aortic valve regurgitation is not visualized. No aortic stenosis is present.  5. Grossly, no valve vegetation noted. FINDINGS  Left Ventricle: Left ventricular ejection fraction, by estimation, is 50 to 55%. The left ventricle has low normal function. The left ventricle has no regional wall motion abnormalities. The left ventricular internal cavity size was normal in size. There is mild left ventricular hypertrophy. Left ventricular diastolic parameters were normal. Right Ventricle: The right ventricular size is normal. No increase in right ventricular wall thickness. Right ventricular systolic function is normal. There is normal pulmonary artery systolic pressure. The tricuspid regurgitant velocity is 2.13 m/s, and  with an assumed right atrial pressure of 5 mmHg, the estimated right ventricular systolic pressure is 69.6 mmHg. Left Atrium: Left atrial size was normal in size. Right Atrium: Right atrial size was normal in size. Pericardium: There is no evidence of pericardial effusion. Mitral Valve: The mitral valve is normal in structure. Mild mitral valve regurgitation. No evidence of mitral valve stenosis. Tricuspid Valve: The tricuspid valve is normal in structure. Tricuspid valve regurgitation is mild . No evidence of tricuspid stenosis. Aortic Valve: The aortic valve is normal in structure. Aortic valve regurgitation is not visualized. No aortic stenosis is present. Pulmonic Valve: The pulmonic valve was normal in structure. Pulmonic valve regurgitation is not visualized. No evidence of pulmonic stenosis. Aorta: The aortic root is normal in size and structure. Venous: The inferior vena cava is normal in size with greater than 50% respiratory variability, suggesting right atrial pressure of 3 mmHg. IAS/Shunts: No atrial level shunt detected by color flow Doppler.  LEFT VENTRICLE PLAX 2D LVIDd:          4.39 cm  Diastology LVIDs:         3.09 cm  LV e' medial:    7.40 cm/s LV PW:         1.03 cm  LV E/e' medial:  11.7 LV IVS:        1.10 cm  LV e' lateral:   9.36 cm/s LVOT diam:     1.80 cm  LV E/e' lateral: 9.3 LV SV:         56 LV SV Index:   24 LVOT Area:     2.54 cm  RIGHT VENTRICLE             IVC RV Basal  diam:  3.51 cm     IVC diam: 2.46 cm RV S prime:     10.10 cm/s LEFT ATRIUM             Index       RIGHT ATRIUM           Index LA diam:        4.10 cm 1.77 cm/m  RA Area:     12.50 cm LA Vol (A2C):   63.4 ml 27.30 ml/m RA Volume:   30.60 ml  13.18 ml/m LA Vol (A4C):   39.6 ml 17.05 ml/m LA Biplane Vol: 53.7 ml 23.12 ml/m  AORTIC VALVE LVOT Vmax:   108.00 cm/s LVOT Vmean:  71.100 cm/s LVOT VTI:    0.219 m  AORTA Ao Root diam: 3.10 cm MITRAL VALVE               TRICUSPID VALVE MV Area (PHT): 4.21 cm    TR Peak grad:   18.1 mmHg MV Decel Time: 180 msec    TR Vmax:        213.00 cm/s MV E velocity: 86.80 cm/s MV A velocity: 57.70 cm/s  SHUNTS MV E/A ratio:  1.50        Systemic VTI:  0.22 m                            Systemic Diam: 1.80 cm Ida Rogue MD Electronically signed by Ida Rogue MD Signature Date/Time: 06/12/2020/7:33:57 AM    Final    [place] EKG SITE RITE  Result Date: 06/13/2020 If Site Rite image not attached, placement could not be confirmed due to current cardiac rhythm.  [place] EKG SITE RITE  Result Date: 06/13/2020 If Site Rite image not attached, placement could not be confirmed due to current cardiac rhythm.  [place] EKG SITE RITE  Result Date: 06/10/2020 If Site Rite image not attached, placement could not be confirmed due to current cardiac rhythm.  DG FL GUIDED LUMBAR PUNCTURE  Result Date: 06/10/2020 CLINICAL DATA:  Concern for meningitis EXAM: DIAGNOSTIC LUMBAR PUNCTURE UNDER FLUOROSCOPIC GUIDANCE FLUOROSCOPY TIME:  Fluoroscopy Time:  18 seconds Radiation Exposure Index (if provided by the fluoroscopic device): 4.2 mGy PROCEDURE: Informed consent was obtained  from the patient prior to the procedure, including potential complications of headache, allergy, and pain. With the patient prone, the lower back was prepped with Betadine. 1% Lidocaine was used for local anesthesia. Lumbar puncture was performed at the L2-L3 level using a 22 gauge needle with return of turbid CSF. 1 ml of CSF obtained for laboratory studies. The patient tolerated the procedure well and there were no apparent complications. IMPRESSION: Technically successful fluoroscopic guided lumbar puncture. Electronically Signed   By: Macy Mis M.D.   On: 06/10/2020 15:59    Labs:  CBC: Recent Labs    06/13/20 1702 06/14/20 0446 06/16/20 0502 06/17/20 0640  WBC 11.4* 12.5* 11.7* 11.0*  HGB 7.1* 8.2* 8.7* 8.6*  HCT 21.7* 25.4* 28.1* 27.1*  PLT 181 230 255 257    COAGS: Recent Labs    06/10/20 1126  INR 1.3*  APTT 28    BMP: Recent Labs    07/19/19 1432 01/17/20 1053 05/23/20 1526 06/13/20 0708 06/14/20 0446 06/15/20 0425 06/16/20 0502 06/17/20 0640  NA 142 140   < > 151* 153* 152* 139 137  K 4.0 3.3*   < > 4.6 4.0  --  3.6 3.4*  CL  108 103   < > 119* 122*  --  109 104  CO2 27 27   < > 18* 23  --  23 26  GLUCOSE 81 312*   < > 174* 303*  --  305* 276*  BUN 19 10   < > 44* 41*  --  19 15  CALCIUM 9.0 8.1*   < > 8.8* 8.6*  --  8.3* 8.3*  CREATININE 0.91 0.93   < > 1.21* 1.12*  --  0.86 0.93  GFRNONAA >60 >60   < > 53* 58*  --  >60 >60  GFRAA >60 >60  --   --   --   --   --   --    < > = values in this interval not displayed.    LIVER FUNCTION TESTS: Recent Labs    06/09/20 1157 06/09/20 2037 06/10/20 0600 06/12/20 1027  BILITOT 2.1* 2.2* 2.4* 1.4*  AST 50* 44* 39 29  ALT 36 31 27 14   ALKPHOS 200* 211* 204* 133*  PROT 7.3 7.6 7.2 5.9*  ALBUMIN 2.8* 2.8* 2.4* 1.7*    TUMOR MARKERS: No results for input(s): AFPTM, CEA, CA199, CHROMGRNA in the last 8760 hours.  Assessment and Plan: Encephalopathy from meningitis Progressive lumbar paraspinous  abscess Imaging reviewed, plan for image guided aspiration. Risks and benefits discussed with the patient and her daughter including bleeding, infection, damage to adjacent structures, and sepsis.  All questions were answered, patient and daughter are agreeable to proceed. Consent signed and in chart.    Thank you for this interesting consult.  I greatly enjoyed meeting Tonya Myers and look forward to participating in their care.  A copy of this report was sent to the requesting provider on this date.  Electronically Signed: Ascencion Dike, PA-C 06/17/2020, 9:51 AM   I spent a total of 20 minutes in face to face in clinical consultation, greater than 50% of which was counseling/coordinating care for paraspinal abscess

## 2020-06-17 NOTE — Progress Notes (Signed)
Patient ID: Tonya Myers, female   DOB: Apr 15, 1966, 55 y.o.   MRN: [id number] [facility] PROGRESS NOTE  Takisha Pelle SLH:[id number] DOB: 1966/02/03 DOA: 06/09/2020 PCP: [name], MD  HPI/Subjective: Patient seen this morning.  She still had difficulty moving her arms.  She was able to straight leg raise a little bit today but still has some incoordination.  She was initially admitted with DKA and on 06/11/2019 was diagnosed with pneumococcal sepsis and meningitis.  Objective: Vitals:   06/17/20 0743 06/17/20 1146  BP: (!) 190/96 (!) 183/109  Pulse: 94 (!) 109  Resp: 17 15  Temp: 98.7 F (37.1 C) 98.3 F (36.8 C)  SpO2:  93%    Intake/Output Summary (Last 24 hours) at 06/17/2020 1407 Last data filed at 06/17/2020 0630 Gross per 24 hour  Intake --  Output 1250 ml  Net -1250 ml   Filed Weights   06/15/20 0420 06/16/20 0425 06/17/20 0351  Weight: 110.5 kg 110.1 kg 109.1 kg    ROS: Review of Systems  Respiratory: Negative for shortness of breath.   Cardiovascular: Negative for chest pain.  Gastrointestinal: Negative for abdominal pain.   Exam: Physical Exam HENT:     Head: Normocephalic.     Mouth/Throat:     Pharynx: No oropharyngeal exudate.  Eyes:     General: Lids are normal.     Conjunctiva/sclera: Conjunctivae normal.  Cardiovascular:     Rate and Rhythm: Normal rate and regular rhythm.     Heart sounds: Normal heart sounds, S1 normal and S2 normal.  Pulmonary:     Breath sounds: Normal breath sounds. No decreased breath sounds, wheezing, rhonchi or rales.  Abdominal:     Palpations: Abdomen is soft.     Tenderness: There is no abdominal tenderness.  Musculoskeletal:     Right ankle: Swelling present.     Left ankle: Swelling present.  Skin:    General: Skin is warm.     Findings: No rash.  Neurological:     Mental Status: She is alert.     Comments: Slightly able to straight leg raise today.  Able to flex and extend at the toes.  Unable to  lift that left arm very well       Data Reviewed: Basic Metabolic Panel: Recent Labs  Lab 06/12/20 1027 06/13/20 0708 06/14/20 0446 06/15/20 0425 06/16/20 0502 06/17/20 0640  NA 149* 151* 153* 152* 139 137  K 4.5 4.6 4.0  --  3.6 3.4*  CL 117* 119* 122*  --  109 104  CO2 20* 18* 23  --  23 26  GLUCOSE 199* 174* 303*  --  305* 276*  BUN 39* 44* 41*  --  19 15  CREATININE 1.13* 1.21* 1.12*  --  0.86 0.93  CALCIUM 9.0 8.8* 8.6*  --  8.3* 8.3*  MG  --  2.4 2.9* 1.6* 1.8 1.7  PHOS  --  5.5* 3.7 2.3* 2.8 2.7   Liver Function Tests: Recent Labs  Lab 06/12/20 1027  AST 29  ALT 14  ALKPHOS 133*  BILITOT 1.4*  PROT 5.9*  ALBUMIN 1.7*   CBC: Recent Labs  Lab 06/12/20 1027 06/13/20 1702 06/14/20 0446 06/16/20 0502 06/17/20 0640  WBC 17.0* 11.4* 12.5* 11.7* 11.0*  NEUTROABS  --  8.2*  --   --   --   HGB 8.6* 7.1* 8.2* 8.7* 8.6*  HCT 26.3* 21.7* 25.4* 28.1* 27.1*  MCV 77.6* 80.4 80.6 81.9 81.1  PLT 232 181 230  255 257    CBG: Recent Labs  Lab 06/16/20 1234 06/16/20 1620 06/16/20 2035 06/17/20 0810 06/17/20 1132  GLUCAP 287* 350* 279* 240* 293*    Recent Results (from the past 240 hour(s))  Resp Panel by RT-PCR (Flu A&B, Covid) Nasopharyngeal Swab     Status: None   Collection Time: 06/09/20  9:45 PM   Specimen: Nasopharyngeal Swab; Nasopharyngeal(NP) swabs in vial transport medium  Result Value Ref Range Status   SARS Coronavirus 2 by RT PCR NEGATIVE NEGATIVE Final    Comment: (NOTE) SARS-CoV-2 target nucleic acids are NOT DETECTED.  The SARS-CoV-2 RNA is generally detectable in upper respiratory specimens during the acute phase of infection. The lowest concentration of SARS-CoV-2 viral copies this assay can detect is 138 copies/mL. A negative result does not preclude SARS-Cov-2 infection and should not be used as the sole basis for treatment or other patient management decisions. A negative result may occur with  improper specimen  collection/handling, submission of specimen other than nasopharyngeal swab, presence of viral mutation(s) within the areas targeted by this assay, and inadequate number of viral copies(<138 copies/mL). A negative result must be combined with clinical observations, patient history, and epidemiological information. The expected result is Negative.  Fact Sheet for Patients:  EntrepreneurPulse.com.au  Fact Sheet for Healthcare Providers:  IncredibleEmployment.be  This test is no t yet approved or cleared by the [place] FDA and  has been authorized for detection and/or diagnosis of SARS-CoV-2 by FDA under an Emergency Use Authorization (EUA). This EUA will remain  in effect (meaning this test can be used) for the duration of the COVID-19 declaration under Section 564(b)(1) of the Act, 21 U.S.C.section 360bbb-3(b)(1), unless the authorization is terminated  or revoked sooner.       Influenza A by PCR NEGATIVE NEGATIVE Final   Influenza B by PCR NEGATIVE NEGATIVE Final    Comment: (NOTE) The Xpert Xpress SARS-CoV-2/FLU/RSV plus assay is intended as an aid in the diagnosis of influenza from Nasopharyngeal swab specimens and should not be used as a sole basis for treatment. Nasal washings and aspirates are unacceptable for Xpert Xpress SARS-CoV-2/FLU/RSV testing.  Fact Sheet for Patients: EntrepreneurPulse.com.au  Fact Sheet for Healthcare Providers: IncredibleEmployment.be  This test is not yet approved or cleared by the [place] FDA and has been authorized for detection and/or diagnosis of SARS-CoV-2 by FDA under an Emergency Use Authorization (EUA). This EUA will remain in effect (meaning this test can be used) for the duration of the COVID-19 declaration under Section 564(b)(1) of the Act, 21 U.S.C. section 360bbb-3(b)(1), unless the authorization is terminated or revoked.  Performed at [facility], [address] [place], [place] [address]   CULTURE, BLOOD (ROUTINE X 2) w Reflex to ID Panel     Status: Abnormal   Collection Time: 06/10/20  9:40 AM   Specimen: BLOOD  Result Value Ref Range Status   Specimen Description   Final    BLOOD BLOOD LEFT FOREARM Performed at [name], [address], [place], [place] [address]    Special Requests   Final    BOTTLES DRAWN AEROBIC AND ANAEROBIC Blood Culture adequate volume Performed at [facility], [address] [place], [place] [address]    Culture  Setup Time   Final    Organism ID to follow IN BOTH AEROBIC AND ANAEROBIC BOTTLES GRAM POSITIVE COCCI CRITICAL RESULT CALLED TO, READ BACK BY AND VERIFIED WITH: [place] @1941  ON 06/10/20 SKL Performed at  [facility], [address] [place], [place] [address]    Culture STREPTOCOCCUS PNEUMONIAE (A)  Final   Report Status 06/13/2020 FINAL  Final   Organism ID, Bacteria STREPTOCOCCUS PNEUMONIAE  Final      Susceptibility   Streptococcus pneumoniae - MIC*    ERYTHROMYCIN >=8 RESISTANT Resistant     LEVOFLOXACIN 0.5 SENSITIVE Sensitive     VANCOMYCIN 0.5 SENSITIVE Sensitive     PENICILLIN (meningitis) <=0.06 SENSITIVE Sensitive     PENO - penicillin <=0.06      PENICILLIN (non-meningitis) <=0.06 SENSITIVE Sensitive     PENICILLIN (oral) <=0.06 SENSITIVE Sensitive     CEFTRIAXONE (non-meningitis) <=0.12 SENSITIVE Sensitive     CEFTRIAXONE (meningitis) <=0.12 SENSITIVE Sensitive     * STREPTOCOCCUS PNEUMONIAE  Blood Culture ID Panel (Reflexed)     Status: Abnormal   Collection Time: 06/10/20  9:40 AM  Result Value Ref Range Status   Enterococcus faecalis NOT DETECTED NOT DETECTED Final   Enterococcus Faecium NOT DETECTED NOT DETECTED Final   Listeria monocytogenes NOT DETECTED NOT DETECTED Final   Staphylococcus species NOT DETECTED NOT DETECTED Final   Staphylococcus aureus (BCID) NOT DETECTED NOT DETECTED Final    Staphylococcus epidermidis NOT DETECTED NOT DETECTED Final   Staphylococcus lugdunensis NOT DETECTED NOT DETECTED Final   Streptococcus species DETECTED (A) NOT DETECTED Final    Comment: CRITICAL RESULT CALLED TO, READ BACK BY AND VERIFIED WITH: SUSAN WATSON @1941  ON 06/10/20 SKL    Streptococcus agalactiae NOT DETECTED NOT DETECTED Final   Streptococcus pneumoniae DETECTED (A) NOT DETECTED Final    Comment: CRITICAL RESULT CALLED TO, READ BACK BY AND VERIFIED WITH: SUSAN WATSON @1941  ON 06/10/20 SKL    Streptococcus pyogenes NOT DETECTED NOT DETECTED Final   A.calcoaceticus-baumannii NOT DETECTED NOT DETECTED Final   Bacteroides fragilis NOT DETECTED NOT DETECTED Final   Enterobacterales NOT DETECTED NOT DETECTED Final   Enterobacter cloacae complex NOT DETECTED NOT DETECTED Final   Escherichia coli NOT DETECTED NOT DETECTED Final   Klebsiella aerogenes NOT DETECTED NOT DETECTED Final   Klebsiella oxytoca NOT DETECTED NOT DETECTED Final   Klebsiella pneumoniae NOT DETECTED NOT DETECTED Final   Proteus species NOT DETECTED NOT DETECTED Final   Salmonella species NOT DETECTED NOT DETECTED Final   Serratia marcescens NOT DETECTED NOT DETECTED Final   Haemophilus influenzae NOT DETECTED NOT DETECTED Final   Neisseria meningitidis NOT DETECTED NOT DETECTED Final   Pseudomonas aeruginosa NOT DETECTED NOT DETECTED Final   Stenotrophomonas maltophilia NOT DETECTED NOT DETECTED Final   Candida albicans NOT DETECTED NOT DETECTED Final   Candida auris NOT DETECTED NOT DETECTED Final   Candida glabrata NOT DETECTED NOT DETECTED Final   Candida krusei NOT DETECTED NOT DETECTED Final   Candida parapsilosis NOT DETECTED NOT DETECTED Final   Candida tropicalis NOT DETECTED NOT DETECTED Final   Cryptococcus neoformans/gattii NOT DETECTED NOT DETECTED Final    Comment: Performed at [facility], [address] [place], [place] [address]  CULTURE, BLOOD (ROUTINE X 2) w Reflex to ID Panel      Status: None   Collection Time: 06/10/20 11:26 AM   Specimen: BLOOD  Result Value Ref Range Status   Specimen Description BLOOD LEFT ARM  Final   Special Requests   Final    BOTTLES DRAWN AEROBIC ONLY Blood Culture adequate volume   Culture   Final    NO GROWTH 5 DAYS Performed at [facility], [place], [place]  [address]    Report Status 06/15/2020 FINAL  Final  CSF culture w Stat Gram Stain     Status: None   Collection Time: 06/10/20  2:19 PM   Specimen: CSF; Cerebrospinal Fluid  Result Value Ref Range Status   Specimen Description   Final    CSF Performed at [facility], [address], [place], [place] [address]    Special Requests   Final    NONE Performed at [facility], [place], [place] [address]    Gram Stain   Final    GRAM POSITIVE DIPLOCOCCI CRITICAL RESULT CALLED TO, READ BACK BY AND VERIFIED WITH: KORY ANDREWS @1627  06/10/20 MJU WBC SEEN RBC SEEN Performed at [facility], [address] [place], [place] [address]    Culture MODERATE STREPTOCOCCUS PNEUMONIAE  Final   Report Status 06/12/2020 FINAL  Final   Organism ID, Bacteria STREPTOCOCCUS PNEUMONIAE  Final      Susceptibility   Streptococcus pneumoniae - MIC*    ERYTHROMYCIN >=8 RESISTANT Resistant     LEVOFLOXACIN 1 SENSITIVE Sensitive     VANCOMYCIN 0.25 SENSITIVE Sensitive     PENICILLIN (meningitis) <=0.06 SENSITIVE Sensitive     PENO - penicillin <=0.06      PENICILLIN (non-meningitis) <=0.06 SENSITIVE Sensitive     PENICILLIN (oral) <=0.06 SENSITIVE Sensitive     CEFTRIAXONE (non-meningitis) <=0.12 SENSITIVE Sensitive     CEFTRIAXONE (meningitis) <=0.12 SENSITIVE Sensitive     * MODERATE STREPTOCOCCUS PNEUMONIAE  MRSA PCR Screening     Status: None   Collection Time: 06/11/20  4:56 AM   Specimen: Nasopharyngeal  Result Value Ref Range Status   MRSA by PCR NEGATIVE NEGATIVE Final    Comment:        The GeneXpert MRSA  Assay (FDA approved for NASAL specimens only), is one component of a comprehensive MRSA colonization surveillance program. It is not intended to diagnose MRSA infection nor to guide or monitor treatment for MRSA infections. Performed at [name], [address] [place], [place] [address]   Culture, blood (Routine X 2) w Reflex to ID Panel     Status: None   Collection Time: 06/12/20 12:41 AM   Specimen: BLOOD  Result Value Ref Range Status   Specimen Description BLOOD BLOOD RIGHT HAND  Final   Special Requests   Final    BOTTLES DRAWN AEROBIC AND ANAEROBIC Blood Culture adequate volume   Culture   Final    NO GROWTH 5 DAYS Performed at [facility], [address], [place], [place] [address]    Report Status 06/17/2020 FINAL  Final  Culture, blood (Routine X 2) w Reflex to ID Panel     Status: None   Collection Time: 06/12/20 12:41 AM   Specimen: BLOOD  Result Value Ref Range Status   Specimen Description BLOOD BLOOD LEFT HAND  Final   Special Requests IN PEDIATRIC BOTTLE Blood Culture adequate volume  Final   Culture   Final    NO GROWTH 5 DAYS Performed at [facility], [address] [place], [place] [address]    Report Status 06/17/2020 FINAL  Final     Studies: MR CERVICAL SPINE WO CONTRAST  Result Date: 06/17/2020 CLINICAL DATA:  Septic arthritis EXAM: MRI CERVICAL AND THORACIC SPINE WITHOUT CONTRAST TECHNIQUE: Multiplanar and multiecho pulse sequences of the cervical spine, to include the craniocervical junction and cervicothoracic junction, and the thoracic spine, were obtained without intravenous contrast. COMPARISON:  MRI June 11, 2020. FINDINGS: MRI CERVICAL SPINE FINDINGS Motion limited examination. Alignment: Normal. Vertebrae: Vertebral body heights are maintained. No focal marrow signal or disc signal abnormality to suggest discitis/osteomyelitis. Canal/Cord: Significantly limited evaluation given motion without definite  cord signal abnormality or canal fluid collection. Posterior Fossa, vertebral arteries, paraspinal tissues: There is edema in the posterior paraspinal soft tissues in the upper cervical spine. No evidence of discrete fluid collection. Disc levels: C2-C3: Posterior disc osteophyte complex with mass effect on the ventral thecal sac. No significant foraminal stenosis. C3-C4: Left eccentric posterior disc osteophyte complex with mass effect on the thecal sac and deformity of the ventral cord with at least moderate canal stenosis. Bilateral facet and uncovertebral hypertrophy. Evaluation of the foramina is limited with possibly moderate bilateral foraminal stenosis C4-C5: Posterior disc osteophyte complex with mass effect on the thecal sac and suspected moderate canal stenosis. Bilateral facet and uncovertebral hypertrophy. Evaluation of foramina is limited with suspected moderate bilateral foraminal stenosis. C5-C6: Posterior disc osteophyte complex with disc contacting the ventral cord and suspected moderate canal stenosis. Evaluation of the foramina is limited with possibly severe bilateral foraminal stenosis C6-C7: Posterior disc osteophyte complex with bilateral facet and uncovertebral hypertrophy. C7-T1: No significant disc protrusion, foraminal stenosis, or canal stenosis. MRI THORACIC SPINE FINDINGS Alignment:  Normal. Vertebrae: Vertebral body heights are maintained. No focal marrow edema to suggest osteomyelitis. Canal/Cord: There is a large fluid collection within the dorsal epidural space spanning the entire thoracic spine (C7-T1 to T12-L1), measuring up to 1.0 x 1.5 cm at T7-T8. The collection appears increased in size to the prior with increased effacement of CSF around the cord. The epidural fluid collection along with a posterior disc osteophyte at T6-T7 results in severe canal stenosis at this level with near complete effacement of CSF. There is mild canal stenosis at T2-T3, T3-T4, T10-T11 and T11-T12  and moderate canal stenosis at T4-T5, T5-T6, T7-T8, T8-T9, T9-T10. No evidence of abnormal cord signal. Paraspinal and other soft tissues: Edema in the posterior paraspinal soft tissues in the upper thoracic spine and lower thoracic spine. Nonspecific T2 hyperintense hepatic lesion, not well characterized on this study. Bibasilar lung opacities, better characterized on recent chest radiograph. Disc levels: Canal stenosis is detailed above. Suspected moderate left foraminal stenosis at T8-T9. IMPRESSION: Motion limited study. 1. Large dorsal epidural fluid collection spanning the entirety of the thoracic spine, increased in size relative to February 16 MRI. The collection along with degenerative change results in severe canal stenosis at T6-T7 with near complete effacement of CSF and multilevel moderate canal stenosis in the thoracic spine, progressed since the prior and detailed above. While nonspecific by imaging, findings are concerning for epidural abscess given the patient's clinical history. Postcontrast imaging could provide more complete evaluation if clinically indicated. 2. Edema within the paraspinal soft tissues in the upper cervical and thoracic spine and lower thoracic spine, concerning for infection/cellulitis given the clinical history. No discrete drainable fluid collection identified in the paraspinal soft tissues of the cervical or thoracic spine identified, although evaluation is limited by motion and absence of IV contrast. Please see separately dictated lumbar spine for infectious findings in the lumbar spine. 3. Evaluation of degenerative change in the cervical spine is significantly limited given motion. Multilevel suspected at least moderate canal stenosis, worst on the left at C3-C4 where posterior disc osteophyte contacts and deforms the left cord with effacement of the left root entry zone. Evaluation of the foramina is particularly limited, but there is suspected  at least moderate  multilevel foraminal stenosis in the cervical spine and on the left at T8-T9. Repeat MRI (possibly with sedation) could further characterize if clinically indicated. Critical findings discussed with Dr. Cheral Marker via telephone at 1:09 PM. Electronically Signed   By: Margaretha Sheffield MD   On: 06/17/2020 13:22   MR THORACIC SPINE WO CONTRAST  Result Date: 06/17/2020 CLINICAL DATA:  Septic arthritis EXAM: MRI CERVICAL AND THORACIC SPINE WITHOUT CONTRAST TECHNIQUE: Multiplanar and multiecho pulse sequences of the cervical spine, to include the craniocervical junction and cervicothoracic junction, and the thoracic spine, were obtained without intravenous contrast. COMPARISON:  MRI June 11, 2020. FINDINGS: MRI CERVICAL SPINE FINDINGS Motion limited examination. Alignment: Normal. Vertebrae: Vertebral body heights are maintained. No focal marrow signal or disc signal abnormality to suggest discitis/osteomyelitis. Canal/Cord: Significantly limited evaluation given motion without definite cord signal abnormality or canal fluid collection. Posterior Fossa, vertebral arteries, paraspinal tissues: There is edema in the posterior paraspinal soft tissues in the upper cervical spine. No evidence of discrete fluid collection. Disc levels: C2-C3: Posterior disc osteophyte complex with mass effect on the ventral thecal sac. No significant foraminal stenosis. C3-C4: Left eccentric posterior disc osteophyte complex with mass effect on the thecal sac and deformity of the ventral cord with at least moderate canal stenosis. Bilateral facet and uncovertebral hypertrophy. Evaluation of the foramina is limited with possibly moderate bilateral foraminal stenosis C4-C5: Posterior disc osteophyte complex with mass effect on the thecal sac and suspected moderate canal stenosis. Bilateral facet and uncovertebral hypertrophy. Evaluation of foramina is limited with suspected moderate bilateral foraminal stenosis. C5-C6: Posterior disc  osteophyte complex with disc contacting the ventral cord and suspected moderate canal stenosis. Evaluation of the foramina is limited with possibly severe bilateral foraminal stenosis C6-C7: Posterior disc osteophyte complex with bilateral facet and uncovertebral hypertrophy. C7-T1: No significant disc protrusion, foraminal stenosis, or canal stenosis. MRI THORACIC SPINE FINDINGS Alignment:  Normal. Vertebrae: Vertebral body heights are maintained. No focal marrow edema to suggest osteomyelitis. Canal/Cord: There is a large fluid collection within the dorsal epidural space spanning the entire thoracic spine (C7-T1 to T12-L1), measuring up to 1.0 x 1.5 cm at T7-T8. The collection appears increased in size to the prior with increased effacement of CSF around the cord. The epidural fluid collection along with a posterior disc osteophyte at T6-T7 results in severe canal stenosis at this level with near complete effacement of CSF. There is mild canal stenosis at T2-T3, T3-T4, T10-T11 and T11-T12 and moderate canal stenosis at T4-T5, T5-T6, T7-T8, T8-T9, T9-T10. No evidence of abnormal cord signal. Paraspinal and other soft tissues: Edema in the posterior paraspinal soft tissues in the upper thoracic spine and lower thoracic spine. Nonspecific T2 hyperintense hepatic lesion, not well characterized on this study. Bibasilar lung opacities, better characterized on recent chest radiograph. Disc levels: Canal stenosis is detailed above. Suspected moderate left foraminal stenosis at T8-T9. IMPRESSION: Motion limited study. 1. Large dorsal epidural fluid collection spanning the entirety of the thoracic spine, increased in size relative to February 16 MRI. The collection along with degenerative change results in severe canal stenosis at T6-T7 with near complete effacement of CSF and multilevel moderate canal stenosis in the thoracic spine, progressed since the prior and detailed above. While nonspecific by imaging, findings are  concerning for epidural abscess given the patient's clinical history. Postcontrast imaging could provide more complete evaluation if clinically indicated. 2. Edema within the paraspinal soft tissues in the upper cervical and thoracic spine and lower thoracic  spine, concerning for infection/cellulitis given the clinical history. No discrete drainable fluid collection identified in the paraspinal soft tissues of the cervical or thoracic spine identified, although evaluation is limited by motion and absence of IV contrast. Please see separately dictated lumbar spine for infectious findings in the lumbar spine. 3. Evaluation of degenerative change in the cervical spine is significantly limited given motion. Multilevel suspected at least moderate canal stenosis, worst on the left at C3-C4 where posterior disc osteophyte contacts and deforms the left cord with effacement of the left root entry zone. Evaluation of the foramina is particularly limited, but there is suspected at least moderate multilevel foraminal stenosis in the cervical spine and on the left at T8-T9. Repeat MRI (possibly with sedation) could further characterize if clinically indicated. Critical findings discussed with Dr. Cheral Marker via telephone at 1:09 PM. Electronically Signed   By: Margaretha Sheffield MD   On: 06/17/2020 13:22   MR LUMBAR SPINE WO CONTRAST  Result Date: 06/16/2020 CLINICAL DATA:  Pneumococcal meningitis. Follow-up lumbar spine infection. New fever today. EXAM: MRI LUMBAR SPINE WITHOUT CONTRAST TECHNIQUE: Multiplanar, multisequence MR imaging of the lumbar spine was performed. No intravenous contrast was administered. COMPARISON:  Lumbar MRI 06/11/2020 FINDINGS: Segmentation:  Normal Alignment:  Normal Vertebrae: No evidence of discitis. No fracture or bone marrow edema. Conus medullaris and cauda equina: Conus extends to the L1-2 level. Conus and cauda equina appear normal. Paraspinal and other soft tissues: Posterior paraspinous  muscle edema and multiple abscesses have progressed in the interval. Fluid collections are present in the muscles which appear to be associated with the facet joints on the left at L2-3 and L3-4. Abscesses appears slightly larger with increased surrounding edema. No right-sided abscess identified. Small epidural abscess posterior on the left at L2-3 is unchanged. Disc levels: L1-2: Negative L2-3: Shallow central disc protrusion with mild spinal stenosis. Fluid in the left facet joint which is contiguous with a posterior paraspinous muscle abscess. Small posterior epidural abscess on the left unchanged. L3-4: Shallow ventral epidural fluid collection has progressed compatible with abscess. Bilateral facet degeneration. Fluid in the left facet joint appears contiguous with the posterior paraspinous muscle abscess. No significant spinal stenosis L4-5: Shallow central disc protrusion. Bilateral facet degeneration. Mild subarticular stenosis bilaterally L5-S1: Moderate disc degeneration with disc space narrowing. Shallow central and left-sided disc protrusion unchanged. Possible left S1 nerve root impingement. Bilateral mild facet degeneration. IMPRESSION: Findings compatible with lumbar spinal infection. There appears to be septic arthritis in the left L2-3 and L3-4 facets with adjacent paraspinous abscesses. Paraspinous abscesses and muscle edema appear mildly progressive. Ventral epidural abscess at L3-4 is small but appears mildly progressive. Small posterior epidural abscess on the left at L2-3 is unchanged. Electronically Signed   By: Franchot Gallo M.D.   On: 06/16/2020 16:06   DG Chest Port 1 View  Result Date: 06/16/2020 CLINICAL DATA:  Fever. EXAM: PORTABLE CHEST 1 VIEW COMPARISON:  06/09/2020 FINDINGS: The cardiomediastinal silhouette is within normal limits for portable AP technique. Lung volumes are low, and there are new mild opacities in both lung bases. No sizable pleural effusion or pneumothorax is  identified. A new right PICC terminates over the SVC. IMPRESSION: Low lung volumes with mild bibasilar opacities favoring atelectasis although pneumonia is not excluded. Electronically Signed   By: Logan Bores M.D.   On: 06/16/2020 13:43    Scheduled Meds: . amLODipine  5 mg Oral Daily  . chlorhexidine  15 mL Mouth Rinse BID  . Chlorhexidine Gluconate  Cloth  6 each Topical Q0600  . gabapentin  200 mg Oral BID  . hydrochlorothiazide  12.5 mg Oral Daily  . insulin aspart  0-5 Units Subcutaneous QHS  . insulin aspart  0-9 Units Subcutaneous TID WC  . insulin glargine  24 Units Subcutaneous Daily  . levothyroxine  150 mcg Oral Q0600  . losartan  100 mg Oral Daily  . magnesium oxide  400 mg Oral Daily  . mouth rinse  15 mL Mouth Rinse q12n4p  . pantoprazole (PROTONIX) IV  40 mg Intravenous Q24H  . potassium chloride  20 mEq Oral Daily  . sodium chloride flush  10-40 mL Intracatheter Q12H  . [START ON 06/19/2020] thiamine injection  100 mg Intravenous Daily  . traZODone  50 mg Oral QHS   Continuous Infusions: . cefTRIAXone (ROCEPHIN)  IV 2 g (06/17/20 0647)  . sodium chloride 0.9 % 25 mL with thiamine (B-1) 250 mg infusion 50 mL/hr at 06/17/20 2202   Brief history.    Patient history of diabetes, CLL and chronic back pain presented to the hospital with altered mental status and inability to talk.  MRI of the brain was negative for stroke.  She was started on insulin drip for DKA.  She also had acute kidney injury.  She spiked a fever and still had altered mental status and the patient was diagnosed with severe pneumococcal sepsis and pneumococcal meningitis by lumbar puncture and blood cultures being positive.  The patient was started on empirically on antibiotics and now just on IV Rocephin.  The patient had an MRI of the entire spine that showed a paraspinal abscess and small epidural abscess.  Seen initially by neurosurgery and IR and at that time did not have anything that they could  drain.  The patient spiked a temperature of 101 on 04/15/2021.  I reconsulted neurology because the patient had more difficulty with straight leg raise.  MRI of the lumbar spine showed increasing paraspinal abscess.  04/16/2021 MRI of the cervical and thoracic spine shows a large dorsal epidural fluid collection spanning the entirety of the thoracic spine.  Edema in the paraspinal soft tissues of her cervical and thoracic spine and lower thoracic spine confirming for infection.  Dr. Lacinda Axon will take to the operating room on 06/17/2020.  Assessment/Plan:  1. 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  Reconsulted Dr. Lacinda Axon neurosurgery who will plan on taking to the operating room on 06/17/2020.  I held the patient's Lovenox today. 2. Severe pneumococcal sepsis and pneumococcal meningitis, present on admission.  Patient had acute metabolic encephalopathy and acute kidney injury.  Initial blood cultures and CFS cultures positive for pneumococcus.  Patient on high-dose Rocephin.  Patient has a PICC line. 3. Acute metabolic encephalopathy.  Patient is on dysphagia diet.  Mental status does wax and wane during the hospital course but overall better than when she first came in.  For the first 2 days she could not even talk.  For the next 2 days she was sedated on Precedex drip secondary to agitation.  Now she is able to talk and move her extremities. 4. Acute kidney injury on presentation with a creatinine of 1.56.  Today's creatinine good at 0.93 5. Diabetic ketoacidosis on presentation.  Patient on Lantus 24 units daily with sliding scale.  Since the patient is going to be n.p.o. for surgery today I will hold off on increasing Lantus further today. 6. Anemia of chronic disease.  Hemoglobin 8.6 today. 7. Essential  hypertension.  Blood pressure very elevated last 2 days.  Norvasc added to hydrochlorothiazide and losartan. 8. Obesity with a BMI of 32.62 9. Elevated liver  function test secondary to sepsis 10. History of CLL 11. Hypernatremia secondary to not eating well initially.  This has improved with IV fluids. 12. Hypomagnesemia and hypophosphatemia.  These have been replaced during the hospital course. 13. Weakness.  Physical therapy recommending rehab  Code Status:     Code Status Orders  (From admission, onward)         Start     Ordered   06/09/20 2313  Full code  Continuous        06/09/20 2314        Code Status History    This patient has a current code status but no historical code status.   Advance Care Planning Activity     Family Communication: Spoke with daughter at the bedside this morning Disposition Plan: Status is: Inpatient  Dispo: The patient is from: Home              Anticipated d/c is to: Rehab              Anticipated d/c date is: Likely will need another week in the hospital at least              Patient currently will potentially go to the operating room this evening for increasing paraspinal abscess and epidural abscess   Difficult to place patient.  Hopefully not.  Consultants:  Infectious disease  Neurology  Neurosurgery  Procedures:  Lumbar puncture  Antibiotics:  Rocephin  Time spent: 32 minutes, case discussed with Dr. Lacinda Axon neurosurgery.  Case also discussed with Dr. Dwaine Gale interventional radiology earlier in the day before I had thoracic and cervical MRI results.  Canceled IR procedure since neurosurgery taken to the operating room tonight.  [place]  [employer]

## 2020-06-17 NOTE — Progress Notes (Signed)
Occupational Therapy Treatment Patient Details Name: Tonya Myers MRN: [id number] DOB: [birth year] Today's Date: 06/17/2020    History of present illness Pt is a 55 y/o female admitted from home on 06/09/20 after presenting with AMS. Pt being treated for acute encephalopathy. 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 Pt also found to have paraspinal abscess, small epidural abscess, septic arthritis L2-L3 and L3-L4 with Neurosurgery & IR recommended serial imaging. PMH: hypothyroidism, DDD, cervical radiculopathy, CLL, DM, HTN, HLD, anxiety, arthritis, CA, chronic back pain, asthma, depression, vertigo.   OT comments  Pt seen for OT treatment on this date. Upon arrival to room awake, seated upright in bed on room air, with daughter present and extremely encouraging throughout. Pt motivated to participate in session by "getting washed up". Pt participated in bed-level toileting with MAX A and supine<>sit transfer with MAX A+2, however pt c/o of dizziness and "seeing blue dots" while sitting EOB following transfer and was returned to supine. Pt very motivated to attempt again 2x trials, however dizziness continuing with each trial; SpO2 81% HR 102 via pulse ox reading on finger, however questionable reading d/t pt's gel nail polish on fingers; pt returned to supine with SpO2 86% on finger, but 93% on toe. Plan to take orthostatics next session. Discharge plan remains appropriate, however therapy at this time remains limited by ongoing medical needs and back pain; lumbar paraspinous abscess to be drained this PM. Anticipate improved pain and participation next session following lumbar paraspinous abscess drain. Pt continues to benefit from skilled OT services to maximize return to PLOF and minimize risk of future falls, injury, caregiver burden, and readmission. Will continue to follow POC.   Follow Up Recommendations  [name]    Equipment Recommendations  Other (comment) (defer to next  venue of care)       Precautions / Restrictions Precautions Precautions: Fall Restrictions Weight Bearing Restrictions: No       Mobility Bed Mobility Overal bed mobility: Needs Assistance Bed Mobility: Supine to Sit;Sit to Supine     Supine to sit: Max assist;+2 for physical assistance Sit to supine: Max assist;+2 for safety/equipment   General bed mobility comments: Assist for BLE and trunk control    Transfers                 General transfer comment: Unable/unsafe to attempt secondary d/t dizziness while sitting EOB    Balance Overall balance assessment: Needs assistance Sitting-balance support: Feet supported;Bilateral upper extremity supported Sitting balance-Leahy Scale: Poor                                     ADL either performed or assessed with clinical judgement   ADL Overall ADL's : Needs assistance/impaired                             Toileting- Clothing Manipulation and Hygiene: Maximal assistance;Bed level       Functional mobility during ADLs: Maximal assistance;+2 for physical assistance                 Cognition Arousal/Alertness: Awake/alert Behavior During Therapy: WFL for tasks assessed/performed Overall Cognitive Status: Impaired/Different from baseline                                 General Comments: Pt  oriented to self and situation. Motivated to participate in therapy this date. Required increased processing time        Exercises General Exercises - Lower Extremity Heel Slides: Both;5 reps;Supine      General Comments Pt c/o of dizziness while sitting EOB and was returned to supine. However pt motivated to attempt again 2x trials, c/o dizziness with each trial; SpO2 81% HR 102 via pulse ox reading on finger, however questionable reading d/t pt's gel nail polish on fingers; pt returned to supine with SpO2 86% on finger, but 93% on toe. Recommend taking orthostatics next session     Pertinent Vitals/ Pain       Pain Assessment: Faces Faces Pain Scale: Hurts even more Pain Location: Neck Pain Descriptors / Indicators: Sore Pain Intervention(s): Monitored during session;Limited activity within patient's tolerance;Repositioned         Frequency  Min 3X/week        Progress Toward Goals  OT Goals(current goals can now be found in the care plan section)  Progress towards OT goals: Progressing toward goals  Acute Rehab OT Goals Patient Stated Goal: To travel OT Goal Formulation: With patient Time For Goal Achievement: 06/29/20 Potential to Achieve Goals: [place] Discharge plan remains appropriate;Frequency remains appropriate       AM-PAC OT "6 Clicks" Daily Activity     Outcome Measure   Help from another person eating meals?: A Little Help from another person taking care of personal grooming?: A Lot Help from another person toileting, which includes using toliet, bedpan, or urinal?: A Lot Help from another person bathing (including washing, rinsing, drying)?: A Lot Help from another person to put on and taking off regular upper body clothing?: A Lot Help from another person to put on and taking off regular lower body clothing?: A Lot 6 Click Score: 13       Activity Tolerance Treatment limited secondary to medical complications (Comment)   Patient Left in bed;with call bell/phone within reach;with bed alarm set   Nurse Communication Mobility status;Other (comment) (SpO2 readings)        Time: 3154-0086 OT Time Calculation (min): 52 min  Charges: OT General Charges $OT Visit: 1 Visit OT Treatments $Therapeutic Activity: 38-52 mins  Fredirick Maudlin, OTR/L [place]

## 2020-06-17 NOTE — Progress Notes (Signed)
I was contacted by neurology today regarding the patient. She had updated imaging of total spine and there is concern for enlarging epidural fluid collection throughout the thoracic spine. She has stable collection in the lumbar spine. She has been on antibiotics and has had clearance of blood cultures from pneumococcus. She has been transferred out of the ICU. Daughter is with her in the room.  Exam: Weak voice with dysarthria She gives 4-/5 grip bilaterally and in bicep but has difficulty with commands She has 5/5 in dorsiflexion and plantarflexion and 3/5 in hip flexion but with difficulty of commands   Plan: I discussed with patient and daughter that given the increased fluid, drainage for culture and decompression is recommended. They understand they will need continued antibiotics and that she is at risk of further infection, spinal cord injury, bleeding, and need for further surgery. They would like to proceed and we will start operative planning

## 2020-06-17 NOTE — Progress Notes (Signed)
Subjective: Brief review of HPI: Tonya Myers a 55 y.o.femaleadmitted with aphasia out of proportion to encephalopathy, fever and tachycardia x 2 days. 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 She had shown consistent improvement on Ceftriaxone. She was awake, alert and able to engage in a conversation on Sunday's Neurology follow up visit. On Monday morning, per primary team, the patient was unable to perform straight-leg raise, which she had been able to do Sunday. She was only able to flex and extend her toes. RUE strength was found to be weak, but LUE was weaker, except for grip strength, also a change from her Sunday exam. Her mentation was still with waxing and waning Monday, which worsened after receiving sedating medication for MRI.   Overnight, MRI C-spine and T-spine was obtained, revealing enlargement of large epidural fluid collection posterior to the thoracic spinal cord, running longitudinally essentially along all thoracic segments. Signal characteristics of the epidural fluid collection are most consistent with an abscess. The MRI reveals worsening relative to the 2/16 scan, the fluid collection having expanded and now effacing the CSF signal anterior and posterior to the cord at multiple contiguous thoracic levels; there is flattening of the cord consistent with compression by the epidural abscess.   Objective: Current vital signs: BP (!) 183/109 (BP Location: Left Wrist)   Pulse (!) 109   Temp 98.3 F (36.8 C) (Oral)   Resp 15   Ht 6' (1.829 m)   Wt 109.1 kg   SpO2 93%   BMI 32.62 kg/m  Vital signs in last 24 hours: Temp:  [98.1 F (36.7 C)-101.1 F (38.4 C)] 98.3 F (36.8 C) (02/22 1146) Pulse Rate:  [94-114] 109 (02/22 1146) Resp:  [15-20] 15 (02/22 1146) BP: (139-190)/(73-109) 183/109 (02/22 1146) SpO2:  [93 %-96 %] 93 % (02/22 1146) Weight:  [109.1 kg] 109.1 kg (02/22 0351)  Intake/Output from previous day: 02/21 0701 - 02/22  0700 In: 120 [P.O.:120] Out: 2350 [Urine:2350] Intake/Output this shift: No intake/output data recorded. Nutritional status:  Diet Order            Diet NPO time specified Except for: Sips with Meds  Diet effective now                 HEENT: [place]/AT. Head is rotated to the right. When attempting to passively rotate to the left, the patient experiences pain.  Lungs: Respirations unlabored Ext: Edema noted.   Neurologic Exam: Ment: Confused and disoriented with increased latencies of verbal and motor responses. Able to follow simple commands. Replies to some questions are short and with decreased information content. She is not able to answer other questions in the context of her confusion.   CN: PERRL. EOMI. Face symmetric. Tremulous movements of face and jaw are noted. Speech is hypophonic.  Motor: Decreased effort but overall exam on several trials is consistent with BLE worse than BUE weakness.  Sensory: Intact to FT bilateral upper and lower extremities proximally and distally.    Lab Results: Results for orders placed or performed during the hospital encounter of 06/09/20 (from the past 48 hour(s))  Glucose, capillary     Status: Abnormal   Collection Time: 06/15/20  5:17 PM  Result Value Ref Range   Glucose-Capillary 256 (H) 70 - 99 mg/dL    Comment: Glucose reference range applies only to samples taken after fasting for at least 8 hours.   Comment 1 Notify RN   Glucose, capillary     Status: Abnormal  Collection Time: 06/15/20  9:22 PM  Result Value Ref Range   Glucose-Capillary 300 (H) 70 - 99 mg/dL    Comment: Glucose reference range applies only to samples taken after fasting for at least 8 hours.  Magnesium     Status: None   Collection Time: 06/16/20  5:02 AM  Result Value Ref Range   Magnesium 1.8 1.7 - 2.4 mg/dL    Comment: Performed at [facility], [address] [place], [place] [address]  Phosphorus     Status: None   Collection Time: 06/16/20   5:02 AM  Result Value Ref Range   Phosphorus 2.8 2.5 - 4.6 mg/dL    Comment: Performed at [facility], [address] [place], [place] [address]  CBC     Status: Abnormal   Collection Time: 06/16/20  5:02 AM  Result Value Ref Range   WBC 11.7 (H) 4.0 - 10.5 K/uL   RBC 3.43 (L) 3.87 - 5.11 MIL/uL   Hemoglobin 8.7 (L) 12.0 - 15.0 g/dL   HCT 28.1 (L) 36.0 - 46.0 %   MCV 81.9 80.0 - 100.0 fL   MCH 25.4 (L) 26.0 - 34.0 pg   MCHC 31.0 30.0 - 36.0 g/dL   RDW 16.9 (H) 11.5 - 15.5 %   Platelets 255 150 - 400 K/uL   nRBC 0.0 0.0 - 0.2 %    Comment: Performed at [facility], [address], [place], [place] [address]  Basic metabolic panel     Status: Abnormal   Collection Time: 06/16/20  5:02 AM  Result Value Ref Range   Sodium 139 135 - 145 mmol/L   Potassium 3.6 3.5 - 5.1 mmol/L   Chloride 109 98 - 111 mmol/L   CO2 23 22 - 32 mmol/L   Glucose, Bld 305 (H) 70 - 99 mg/dL    Comment: Glucose reference range applies only to samples taken after fasting for at least 8 hours.   BUN 19 6 - 20 mg/dL   Creatinine, Ser 0.86 0.44 - 1.00 mg/dL   Calcium 8.3 (L) 8.9 - 10.3 mg/dL   GFR, Estimated >60 >60 mL/min    Comment: (NOTE) Calculated using the CKD-EPI Creatinine Equation (2021)    Anion gap 7 5 - 15    Comment: Performed at [facility], [address] [place], [place] [address]  Glucose, capillary     Status: Abnormal   Collection Time: 06/16/20  7:51 AM  Result Value Ref Range   Glucose-Capillary 258 (H) 70 - 99 mg/dL    Comment: Glucose reference range applies only to samples taken after fasting for at least 8 hours.  Glucose, capillary     Status: Abnormal   Collection Time: 06/16/20 12:34 PM  Result Value Ref Range   Glucose-Capillary 287 (H) 70 - 99 mg/dL    Comment: Glucose reference range applies only to samples taken after fasting for at least 8 hours.  Glucose, capillary     Status: Abnormal   Collection Time: 06/16/20  4:20 PM  Result Value  Ref Range   Glucose-Capillary 350 (H) 70 - 99 mg/dL    Comment: Glucose reference range applies only to samples taken after fasting for at least 8 hours.  Glucose, capillary     Status: Abnormal   Collection Time: 06/16/20  8:35 PM  Result Value Ref Range   Glucose-Capillary 279 (H) 70 - 99 mg/dL    Comment: Glucose reference range applies only to samples taken  after fasting for at least 8 hours.  Magnesium     Status: None   Collection Time: 06/17/20  6:40 AM  Result Value Ref Range   Magnesium 1.7 1.7 - 2.4 mg/dL    Comment: Performed at [facility], [address] [place], [place] [address]  Phosphorus     Status: None   Collection Time: 06/17/20  6:40 AM  Result Value Ref Range   Phosphorus 2.7 2.5 - 4.6 mg/dL    Comment: Performed at [facility], [address] [place], [place] [address]  CBC     Status: Abnormal   Collection Time: 06/17/20  6:40 AM  Result Value Ref Range   WBC 11.0 (H) 4.0 - 10.5 K/uL   RBC 3.34 (L) 3.87 - 5.11 MIL/uL   Hemoglobin 8.6 (L) 12.0 - 15.0 g/dL   HCT 27.1 (L) 36.0 - 46.0 %   MCV 81.1 80.0 - 100.0 fL   MCH 25.7 (L) 26.0 - 34.0 pg   MCHC 31.7 30.0 - 36.0 g/dL   RDW 17.2 (H) 11.5 - 15.5 %   Platelets 257 150 - 400 K/uL   nRBC 0.0 0.0 - 0.2 %    Comment: Performed at [facility], [address], [place], [place] [address]  Basic metabolic panel     Status: Abnormal   Collection Time: 06/17/20  6:40 AM  Result Value Ref Range   Sodium 137 135 - 145 mmol/L   Potassium 3.4 (L) 3.5 - 5.1 mmol/L   Chloride 104 98 - 111 mmol/L   CO2 26 22 - 32 mmol/L   Glucose, Bld 276 (H) 70 - 99 mg/dL    Comment: Glucose reference range applies only to samples taken after fasting for at least 8 hours.   BUN 15 6 - 20 mg/dL   Creatinine, Ser 0.93 0.44 - 1.00 mg/dL   Calcium 8.3 (L) 8.9 - 10.3 mg/dL   GFR, Estimated >60 >60 mL/min    Comment: (NOTE) Calculated using the CKD-EPI Creatinine Equation (2021)    Anion gap 7 5 - 15     Comment: Performed at [facility], [address] [place], [place] [address]  Glucose, capillary     Status: Abnormal   Collection Time: 06/17/20  8:10 AM  Result Value Ref Range   Glucose-Capillary 240 (H) 70 - 99 mg/dL    Comment: Glucose reference range applies only to samples taken after fasting for at least 8 hours.  Glucose, capillary     Status: Abnormal   Collection Time: 06/17/20 11:32 AM  Result Value Ref Range   Glucose-Capillary 293 (H) 70 - 99 mg/dL    Comment: Glucose reference range applies only to samples taken after fasting for at least 8 hours.    Recent Results (from the past 240 hour(s))  Resp Panel by RT-PCR (Flu A&B, Covid) Nasopharyngeal Swab     Status: None   Collection Time: 06/09/20  9:45 PM   Specimen: Nasopharyngeal Swab; Nasopharyngeal(NP) swabs in vial transport medium  Result Value Ref Range Status   SARS Coronavirus 2 by RT PCR NEGATIVE NEGATIVE Final    Comment: (NOTE) SARS-CoV-2 target nucleic acids are NOT DETECTED.  The SARS-CoV-2 RNA is generally detectable in upper respiratory specimens during the acute phase of infection. The lowest concentration of SARS-CoV-2 viral copies this assay can detect is 138 copies/mL. A negative result does not preclude SARS-Cov-2 infection and should not be used as the sole basis for treatment or  other patient management decisions. A negative result may occur with  improper specimen collection/handling, submission of specimen other than nasopharyngeal swab, presence of viral mutation(s) within the areas targeted by this assay, and inadequate number of viral copies(<138 copies/mL). A negative result must be combined with clinical observations, patient history, and epidemiological information. The expected result is Negative.  Fact Sheet for Patients:  EntrepreneurPulse.com.au  Fact Sheet for Healthcare Providers:  IncredibleEmployment.be  This test is no t  yet approved or cleared by the [place] FDA and  has been authorized for detection and/or diagnosis of SARS-CoV-2 by FDA under an Emergency Use Authorization (EUA). This EUA will remain  in effect (meaning this test can be used) for the duration of the COVID-19 declaration under Section 564(b)(1) of the Act, 21 U.S.C.section 360bbb-3(b)(1), unless the authorization is terminated  or revoked sooner.       Influenza A by PCR NEGATIVE NEGATIVE Final   Influenza B by PCR NEGATIVE NEGATIVE Final    Comment: (NOTE) The Xpert Xpress SARS-CoV-2/FLU/RSV plus assay is intended as an aid in the diagnosis of influenza from Nasopharyngeal swab specimens and should not be used as a sole basis for treatment. Nasal washings and aspirates are unacceptable for Xpert Xpress SARS-CoV-2/FLU/RSV testing.  Fact Sheet for Patients: EntrepreneurPulse.com.au  Fact Sheet for Healthcare Providers: IncredibleEmployment.be  This test is not yet approved or cleared by the [place] FDA and has been authorized for detection and/or diagnosis of SARS-CoV-2 by FDA under an Emergency Use Authorization (EUA). This EUA will remain in effect (meaning this test can be used) for the duration of the COVID-19 declaration under Section 564(b)(1) of the Act, 21 U.S.C. section 360bbb-3(b)(1), unless the authorization is terminated or revoked.  Performed at [facility], [address] [place], [place] [address]   CULTURE, BLOOD (ROUTINE X 2) w Reflex to ID Panel     Status: Abnormal   Collection Time: 06/10/20  9:40 AM   Specimen: BLOOD  Result Value Ref Range Status   Specimen Description   Final    BLOOD BLOOD LEFT FOREARM Performed at [facility], [address] [place], [place] [address]    Special Requests   Final    BOTTLES DRAWN AEROBIC AND ANAEROBIC Blood Culture adequate volume Performed at [facility], [address],  [place], [place] [address]    Culture  Setup Time   Final    Organism ID to follow IN BOTH AEROBIC AND ANAEROBIC BOTTLES GRAM POSITIVE COCCI CRITICAL RESULT CALLED TO, READ BACK BY AND VERIFIED WITH: [place] @1941  ON 06/10/20 SKL Performed at [facility], [address] [place], [place] [address]    Culture STREPTOCOCCUS PNEUMONIAE (A)  Final   Report Status 06/13/2020 FINAL  Final   Organism ID, Bacteria STREPTOCOCCUS PNEUMONIAE  Final      Susceptibility   Streptococcus pneumoniae - MIC*    ERYTHROMYCIN >=8 RESISTANT Resistant     LEVOFLOXACIN 0.5 SENSITIVE Sensitive     VANCOMYCIN 0.5 SENSITIVE Sensitive     PENICILLIN (meningitis) <=0.06 SENSITIVE Sensitive     PENO - penicillin <=0.06      PENICILLIN (non-meningitis) <=0.06 SENSITIVE Sensitive     PENICILLIN (oral) <=0.06 SENSITIVE Sensitive     CEFTRIAXONE (non-meningitis) <=0.12 SENSITIVE Sensitive     CEFTRIAXONE (meningitis) <=0.12 SENSITIVE Sensitive     * STREPTOCOCCUS PNEUMONIAE  Blood Culture ID Panel (Reflexed)     Status: Abnormal   Collection Time: 06/10/20  9:40 AM  Result Value Ref Range Status   Enterococcus faecalis NOT DETECTED NOT DETECTED Final   Enterococcus Faecium NOT DETECTED NOT DETECTED Final   Listeria monocytogenes NOT DETECTED NOT DETECTED Final   Staphylococcus species NOT DETECTED NOT DETECTED Final   Staphylococcus aureus (BCID) NOT DETECTED NOT DETECTED Final   Staphylococcus epidermidis NOT DETECTED NOT DETECTED Final   Staphylococcus lugdunensis NOT DETECTED NOT DETECTED Final   Streptococcus species DETECTED (A) NOT DETECTED Final    Comment: CRITICAL RESULT CALLED TO, READ BACK BY AND VERIFIED WITH: SUSAN WATSON @1941  ON 06/10/20 SKL    Streptococcus agalactiae NOT DETECTED NOT DETECTED Final   Streptococcus pneumoniae DETECTED (A) NOT DETECTED Final    Comment: CRITICAL RESULT CALLED TO, READ BACK BY AND VERIFIED WITH: SUSAN WATSON @1941  ON 06/10/20 SKL    Streptococcus  pyogenes NOT DETECTED NOT DETECTED Final   A.calcoaceticus-baumannii NOT DETECTED NOT DETECTED Final   Bacteroides fragilis NOT DETECTED NOT DETECTED Final   Enterobacterales NOT DETECTED NOT DETECTED Final   Enterobacter cloacae complex NOT DETECTED NOT DETECTED Final   Escherichia coli NOT DETECTED NOT DETECTED Final   Klebsiella aerogenes NOT DETECTED NOT DETECTED Final   Klebsiella oxytoca NOT DETECTED NOT DETECTED Final   Klebsiella pneumoniae NOT DETECTED NOT DETECTED Final   Proteus species NOT DETECTED NOT DETECTED Final   Salmonella species NOT DETECTED NOT DETECTED Final   Serratia marcescens NOT DETECTED NOT DETECTED Final   Haemophilus influenzae NOT DETECTED NOT DETECTED Final   Neisseria meningitidis NOT DETECTED NOT DETECTED Final   Pseudomonas aeruginosa NOT DETECTED NOT DETECTED Final   Stenotrophomonas maltophilia NOT DETECTED NOT DETECTED Final   Candida albicans NOT DETECTED NOT DETECTED Final   Candida auris NOT DETECTED NOT DETECTED Final   Candida glabrata NOT DETECTED NOT DETECTED Final   Candida krusei NOT DETECTED NOT DETECTED Final   Candida parapsilosis NOT DETECTED NOT DETECTED Final   Candida tropicalis NOT DETECTED NOT DETECTED Final   Cryptococcus neoformans/gattii NOT DETECTED NOT DETECTED Final    Comment: Performed at [facility], [address] [place], [place] [address]  CULTURE, BLOOD (ROUTINE X 2) w Reflex to ID Panel     Status: None   Collection Time: 06/10/20 11:26 AM   Specimen: BLOOD  Result Value Ref Range Status   Specimen Description BLOOD LEFT ARM  Final   Special Requests   Final    BOTTLES DRAWN AEROBIC ONLY Blood Culture adequate volume   Culture   Final    NO GROWTH 5 DAYS Performed at [facility], [address] [place], [place] [address]    Report Status 06/15/2020 FINAL  Final  CSF culture w Stat Gram Stain     Status: None   Collection Time: 06/10/20  2:19 PM   Specimen: CSF; Cerebrospinal Fluid   Result Value Ref Range Status   Specimen Description   Final    CSF Performed at [facility], [address], [place], [place] [address]    Special Requests   Final    NONE Performed at [facility], [address] [place], [place] [address]    Gram Stain   Final    GRAM POSITIVE DIPLOCOCCI CRITICAL RESULT CALLED TO, READ BACK BY AND VERIFIED WITH: KORY ANDREWS @1627  06/10/20 MJU WBC SEEN RBC SEEN Performed at [facility], [address] [place], [place] [address]    Culture MODERATE STREPTOCOCCUS PNEUMONIAE  Final   Report Status 06/12/2020 FINAL  Final  Organism ID, Bacteria STREPTOCOCCUS PNEUMONIAE  Final      Susceptibility   Streptococcus pneumoniae - MIC*    ERYTHROMYCIN >=8 RESISTANT Resistant     LEVOFLOXACIN 1 SENSITIVE Sensitive     VANCOMYCIN 0.25 SENSITIVE Sensitive     PENICILLIN (meningitis) <=0.06 SENSITIVE Sensitive     PENO - penicillin <=0.06      PENICILLIN (non-meningitis) <=0.06 SENSITIVE Sensitive     PENICILLIN (oral) <=0.06 SENSITIVE Sensitive     CEFTRIAXONE (non-meningitis) <=0.12 SENSITIVE Sensitive     CEFTRIAXONE (meningitis) <=0.12 SENSITIVE Sensitive     * MODERATE STREPTOCOCCUS PNEUMONIAE  MRSA PCR Screening     Status: None   Collection Time: 06/11/20  4:56 AM   Specimen: Nasopharyngeal  Result Value Ref Range Status   MRSA by PCR NEGATIVE NEGATIVE Final    Comment:        The GeneXpert MRSA Assay (FDA approved for NASAL specimens only), is one component of a comprehensive MRSA colonization surveillance program. It is not intended to diagnose MRSA infection nor to guide or monitor treatment for MRSA infections. Performed at [facility], [address] [place], [place] [address]   Culture, blood (Routine X 2) w Reflex to ID Panel     Status: None   Collection Time: 06/12/20 12:41 AM   Specimen: BLOOD  Result Value Ref Range Status   Specimen Description BLOOD BLOOD RIGHT HAND  Final    Special Requests   Final    BOTTLES DRAWN AEROBIC AND ANAEROBIC Blood Culture adequate volume   Culture   Final    NO GROWTH 5 DAYS Performed at [facility], [address], [place], [place] [address]    Report Status 06/17/2020 FINAL  Final  Culture, blood (Routine X 2) w Reflex to ID Panel     Status: None   Collection Time: 06/12/20 12:41 AM   Specimen: BLOOD  Result Value Ref Range Status   Specimen Description BLOOD BLOOD LEFT HAND  Final   Special Requests IN PEDIATRIC BOTTLE Blood Culture adequate volume  Final   Culture   Final    NO GROWTH 5 DAYS Performed at [facility], [address] [place], [place] [address]    Report Status 06/17/2020 FINAL  Final    Lipid Panel No results for input(s): CHOL, TRIG, HDL, CHOLHDL, VLDL, LDLCALC in the last 72 hours.  Studies/Results: MR LUMBAR SPINE WO CONTRAST  Result Date: 06/16/2020 CLINICAL DATA:  Pneumococcal meningitis. Follow-up lumbar spine infection. New fever today. EXAM: MRI LUMBAR SPINE WITHOUT CONTRAST TECHNIQUE: Multiplanar, multisequence MR imaging of the lumbar spine was performed. No intravenous contrast was administered. COMPARISON:  Lumbar MRI 06/11/2020 FINDINGS: Segmentation:  Normal Alignment:  Normal Vertebrae: No evidence of discitis. No fracture or bone marrow edema. Conus medullaris and cauda equina: Conus extends to the L1-2 level. Conus and cauda equina appear normal. Paraspinal and other soft tissues: Posterior paraspinous muscle edema and multiple abscesses have progressed in the interval. Fluid collections are present in the muscles which appear to be associated with the facet joints on the left at L2-3 and L3-4. Abscesses appears slightly larger with increased surrounding edema. No right-sided abscess identified. Small epidural abscess posterior on the left at L2-3 is unchanged. Disc levels: L1-2: Negative L2-3: Shallow central disc protrusion with mild spinal stenosis. Fluid in the left  facet joint which is contiguous with a posterior paraspinous muscle abscess. Small posterior epidural abscess on the left unchanged. L3-4: Shallow ventral epidural fluid  collection has progressed compatible with abscess. Bilateral facet degeneration. Fluid in the left facet joint appears contiguous with the posterior paraspinous muscle abscess. No significant spinal stenosis L4-5: Shallow central disc protrusion. Bilateral facet degeneration. Mild subarticular stenosis bilaterally L5-S1: Moderate disc degeneration with disc space narrowing. Shallow central and left-sided disc protrusion unchanged. Possible left S1 nerve root impingement. Bilateral mild facet degeneration. IMPRESSION: Findings compatible with lumbar spinal infection. There appears to be septic arthritis in the left L2-3 and L3-4 facets with adjacent paraspinous abscesses. Paraspinous abscesses and muscle edema appear mildly progressive. Ventral epidural abscess at L3-4 is small but appears mildly progressive. Small posterior epidural abscess on the left at L2-3 is unchanged. Electronically Signed   By: Franchot Gallo M.D.   On: 06/16/2020 16:06   DG Chest Port 1 View  Result Date: 06/16/2020 CLINICAL DATA:  Fever. EXAM: PORTABLE CHEST 1 VIEW COMPARISON:  06/09/2020 FINDINGS: The cardiomediastinal silhouette is within normal limits for portable AP technique. Lung volumes are low, and there are new mild opacities in both lung bases. No sizable pleural effusion or pneumothorax is identified. A new right PICC terminates over the SVC. IMPRESSION: Low lung volumes with mild bibasilar opacities favoring atelectasis although pneumonia is not excluded. Electronically Signed   By: Logan Bores M.D.   On: 06/16/2020 13:43    Medications:  Scheduled: . amLODipine  5 mg Oral Daily  . chlorhexidine  15 mL Mouth Rinse BID  . Chlorhexidine Gluconate Cloth  6 each Topical Q0600  . gabapentin  200 mg Oral BID  . hydrochlorothiazide  12.5 mg Oral Daily  .  insulin aspart  0-5 Units Subcutaneous QHS  . insulin aspart  0-9 Units Subcutaneous TID WC  . insulin glargine  24 Units Subcutaneous Daily  . levothyroxine  150 mcg Oral Q0600  . losartan  100 mg Oral Daily  . magnesium oxide  400 mg Oral Daily  . mouth rinse  15 mL Mouth Rinse q12n4p  . pantoprazole (PROTONIX) IV  40 mg Intravenous Q24H  . potassium chloride  20 mEq Oral Daily  . sodium chloride flush  10-40 mL Intracatheter Q12H  . [START ON 06/19/2020] thiamine injection  100 mg Intravenous Daily  . traZODone  50 mg Oral QHS   Continuous: . cefTRIAXone (ROCEPHIN)  IV 2 g (06/17/20 0647)  . sodium chloride 0.9 % 25 mL with thiamine (B-1) 250 mg infusion 50 mL/hr at 06/17/20 0814   Assessment: 55 y.o.femalewith CLL and uncontrolled DM2 who was admitted with strep pneumoniae bacteremia, Pneumococcal Meningitis and paraspinal abscesses. LP performed on 2/15 confirmed meningitis for which she was started on ABX. Her MRI of C-T-L spine on 2/16 showed paraspinal abscesses and a poorly defined longitudinally-extensive fluid collection posterior to the cord at the thoracic levels. Repeat MRI of thoracic spine overnight reveals the fluid collection to have enlarged, with associated diffuse thoracic spinal cord compression; the signal characteristics of the fluid collection on the current scan are most consistent with pus.   - Findings from MRI T-spine obtained overnight: There is enlargement of the large epidural fluid collection posterior to the thoracic spinal cord, running longitudinally essentially along all thoracic segments. Signal characteristics of the epidural fluid collection are most consistent with an abscess. The MRI reveals worsening relative to the 2/16 scan, the fluid collection having expanded and now effacing the CSF signal anterior and posterior to the cord at multiple contiguous thoracic levels; there is flattening of the cord consistent with compression by the epidural  abscess.  Discussed with Radiology and Neurosurgery.  - Repeat MRI of lumbar spine performed Monday (2/21) revealed findings compatible with lumbar spinal infection. There appears to be septic arthritis in the left L2-3 and L3-4 facets with adjacent paraspinous abscesses. Paraspinous abscesses and muscle edema appear mildly progressive. Ventral epidural abscess at L3-4 is small but also appears mildly progressive. Small posterior epidural abscess on the left at L2-3 is unchanged. The lumbar findings do not explain her worsening.  - CSF culture (sample obtained 2/15): STREPTOCOCCUS - Blood culture on 2/15 was positive for strep pneumoniae. Repeat blood culture 2/17 with no growth.  - Overnight, MRI C-spine and T-spine was obtained, revealing enlargement of large epidural fluid collection posterior to the thoracic spinal cord, running longitudinally essentially along all thoracic segments. Signal characteristics of the epidural fluid collection are most consistent with an abscess. The MRI reveals worsening relative to the 2/16 scan, the fluid collection having expanded and now effacing the CSF signal anterior and posterior to the cord at multiple contiguous thoracic levels; there is flattening of the cord consistent with compression by the epidural abscess.   Recommendations: - Discussed new MRI findings with Neurosurgery, Radiology and ID - Neurosurgery planning operative procedure to drain the epidural abscess.  - ABX per ID - Neurology will continue to follow    LOS: 8 days   @Electronically  signed: Dr. Kerney Elbe 06/17/2020  1:24 PM

## 2020-06-18 ENCOUNTER — Encounter: Payer: Self-pay | Admitting: Neurosurgery

## 2020-06-18 DIAGNOSIS — R7881 Bacteremia: Secondary | ICD-10-CM | POA: Diagnosis not present

## 2020-06-18 DIAGNOSIS — G062 Extradural and subdural abscess, unspecified: Secondary | ICD-10-CM | POA: Diagnosis not present

## 2020-06-18 DIAGNOSIS — M4620 Osteomyelitis of vertebra, site unspecified: Secondary | ICD-10-CM | POA: Diagnosis not present

## 2020-06-18 DIAGNOSIS — D649 Anemia, unspecified: Secondary | ICD-10-CM

## 2020-06-18 DIAGNOSIS — G009 Bacterial meningitis, unspecified: Secondary | ICD-10-CM | POA: Diagnosis not present

## 2020-06-18 LAB — GLUCOSE, CAPILLARY
Glucose-Capillary: 352 mg/dL — ABNORMAL HIGH (ref 70–99)
Glucose-Capillary: 326 mg/dL — ABNORMAL HIGH (ref 70–99)
Glucose-Capillary: 321 mg/dL — ABNORMAL HIGH (ref 70–99)
Glucose-Capillary: 312 mg/dL — ABNORMAL HIGH (ref 70–99)

## 2020-06-18 LAB — PHOSPHORUS: Phosphorus: 3.3 mg/dL (ref 2.5–4.6)

## 2020-06-18 LAB — MAGNESIUM: Magnesium: 1.8 mg/dL (ref 1.7–2.4)

## 2020-06-18 MED ORDER — INSULIN ASPART 100 UNIT/ML ~~LOC~~ SOLN
0.0000 [IU] | Freq: Every day | SUBCUTANEOUS | Status: DC
  Administered 2020-06-18: 22:00:00 4 [IU] via SUBCUTANEOUS
  Administered 2020-06-19 – 2020-06-22 (×2): 2 [IU] via SUBCUTANEOUS
  Administered 2020-06-23 – 2020-06-25 (×3): 3 [IU] via SUBCUTANEOUS
  Filled 2020-06-18 (×6): qty 1

## 2020-06-18 MED ORDER — SODIUM CHLORIDE 0.9 % IV SOLN: INTRAVENOUS | Status: DC | PRN

## 2020-06-18 MED ORDER — INSULIN ASPART 100 UNIT/ML ~~LOC~~ SOLN
0.0000 [IU] | Freq: Three times a day (TID) | SUBCUTANEOUS | Status: DC
  Administered 2020-06-18 – 2020-06-19 (×3): 11 [IU] via SUBCUTANEOUS
  Administered 2020-06-19: 17:00:00 5 [IU] via SUBCUTANEOUS
  Administered 2020-06-19: 8 [IU] via SUBCUTANEOUS
  Administered 2020-06-20: 11 [IU] via SUBCUTANEOUS
  Administered 2020-06-20: 17:00:00 5 [IU] via SUBCUTANEOUS
  Administered 2020-06-20: 10:00:00 2 [IU] via SUBCUTANEOUS
  Administered 2020-06-21: 3 [IU] via SUBCUTANEOUS
  Administered 2020-06-21: 12:00:00 5 [IU] via SUBCUTANEOUS
  Administered 2020-06-21: 09:00:00 3 [IU] via SUBCUTANEOUS
  Administered 2020-06-22: 8 [IU] via SUBCUTANEOUS
  Administered 2020-06-22 (×2): 5 [IU] via SUBCUTANEOUS
  Administered 2020-06-23: 12:00:00 8 [IU] via SUBCUTANEOUS
  Administered 2020-06-23: 16:00:00 3 [IU] via SUBCUTANEOUS
  Administered 2020-06-24 (×3): 5 [IU] via SUBCUTANEOUS
  Administered 2020-06-25: 3 [IU] via SUBCUTANEOUS
  Administered 2020-06-25: 5 [IU] via SUBCUTANEOUS
  Administered 2020-06-25: 13:00:00 3 [IU] via SUBCUTANEOUS
  Administered 2020-06-26: 2 [IU] via SUBCUTANEOUS
  Administered 2020-06-26: 13:00:00 5 [IU] via SUBCUTANEOUS
  Administered 2020-06-26: 3 [IU] via SUBCUTANEOUS
  Administered 2020-06-27: 10 [IU] via SUBCUTANEOUS
  Filled 2020-06-18 (×24): qty 1

## 2020-06-18 MED ORDER — DILTIAZEM HCL ER COATED BEADS 120 MG PO CP24
120.0000 mg | ORAL_CAPSULE | Freq: Every day | ORAL | Status: DC
  Administered 2020-06-18 – 2020-06-21 (×4): 120 mg via ORAL
  Filled 2020-06-18 (×4): qty 1

## 2020-06-18 MED ORDER — CYCLOBENZAPRINE HCL 10 MG PO TABS
10.0000 mg | ORAL_TABLET | Freq: Three times a day (TID) | ORAL | Status: DC | PRN
  Administered 2020-06-19 (×2): 10 mg via ORAL
  Filled 2020-06-18 (×2): qty 1

## 2020-06-18 MED ORDER — PANTOPRAZOLE SODIUM 40 MG PO TBEC
40.0000 mg | DELAYED_RELEASE_TABLET | Freq: Every day | ORAL | Status: DC
  Administered 2020-06-18 – 2020-06-27 (×10): 40 mg via ORAL
  Filled 2020-06-18 (×10): qty 1

## 2020-06-18 MED ORDER — VALACYCLOVIR HCL 500 MG PO TABS
1000.0000 mg | ORAL_TABLET | Freq: Two times a day (BID) | ORAL | Status: DC
  Administered 2020-06-18 – 2020-06-27 (×18): 1000 mg via ORAL
  Filled 2020-06-18 (×20): qty 2

## 2020-06-18 MED ORDER — INSULIN GLARGINE 100 UNIT/ML ~~LOC~~ SOLN
30.0000 [IU] | Freq: Every day | SUBCUTANEOUS | Status: DC
  Filled 2020-06-18: qty 0.3

## 2020-06-18 NOTE — Plan of Care (Signed)
  Problem: Safety: Goal: Ability to remain free from injury will improve Outcome: Progressing   Problem: Education: Goal: Knowledge of General Education information will improve Description: Including pain rating scale, medication(s)/side effects and non-pharmacologic comfort measures Outcome: Not Progressing   Problem: Clinical Measurements: Goal: Will remain free from infection Outcome: Not Progressing   Problem: Activity: Goal: Risk for activity intolerance will decrease Outcome: Not Progressing   Problem: Coping: Goal: Level of anxiety will decrease Outcome: Not Progressing   Problem: Pain Managment: Goal: General experience of comfort will improve Outcome: Not Progressing Note: Refused pain management after

## 2020-06-18 NOTE — Progress Notes (Signed)
ID Pt underwent T3/4 , T5-7 laminectomy, T9/10 Laminectomies and evacuation of epidural abscess Pt is more alert, confused a bit, as per daughter she is having visual hallucinations   Patient Vitals for the past 24 hrs:  BP Temp Temp src Pulse Resp SpO2 Height Weight  06/18/20 1106 (!) 142/87 98.9 F (37.2 C) Oral 99 17 95 % - -  06/18/20 0710 (!) 176/86 98.9 F (37.2 C) Oral 98 18 96 % - -  06/18/20 0500 - - - - - - - 104.6 kg  06/18/20 0405 (!) 162/94 97.9 F (36.6 C) Oral 99 20 99 % - -  06/18/20 0032 (!) 152/97 98 F (36.7 C) - (!) 103 20 90 % - -  06/17/20 2130 (!) 156/88 98 F (36.7 C) - 83 18 95 % - -  06/17/20 2115 (!) 145/88 - - 97 20 95 % - -  06/17/20 2100 (!) 149/79 - - 96 20 95 % - -  06/17/20 2045 (!) 141/78 - - 94 20 99 % - -  06/17/20 2030 (!) 142/77 - - 95 20 100 % - -  06/17/20 2019 (!) 151/89 97.9 F (36.6 C) - 90 18 100 % - -  06/17/20 1622 (!) 180/99 - - - (!) 32 - - -  06/17/20 1621 - (!) 101.7 F (38.7 C) - - - - 6' (1.829 m) 109.1 kg  06/17/20 1540 - - - (!) 109 - - - -  06/17/20 1537 (!) 183/96 99 F (37.2 C) - (!) 113 16 93 % - -    O/E Pt more awake, talking , some confusion Chest b/l air entry [id number] Abd soft Has 3 drains in the back Moves all her extremities  Labs CBC Latest Ref Rng & Units 06/17/2020 06/16/2020 06/14/2020  WBC 4.0 - 10.5 K/uL 11.0(H) 11.7(H) 12.5(H)  Hemoglobin 12.0 - 15.0 g/dL 8.6(L) 8.7(L) 8.2(L)  Hematocrit 36.0 - 46.0 % 27.1(L) 28.1(L) 25.4(L)  Platelets 150 - 400 K/uL 257 255 230    CMP Latest Ref Rng & Units 06/17/2020 06/16/2020 06/15/2020  Glucose 70 - 99 mg/dL 276(H) 305(H) -  BUN 6 - 20 mg/dL 15 19 -  Creatinine 0.44 - 1.00 mg/dL 0.93 0.86 -  Sodium 135 - 145 mmol/L 137 139 152(H)  Potassium 3.5 - 5.1 mmol/L 3.4(L) 3.6 -  Chloride 98 - 111 mmol/L 104 109 -  CO2 22 - 32 mmol/L 26 23 -  Calcium 8.9 - 10.3 mg/dL 8.3(L) 8.3(L) -  Total Protein 6.5 - 8.1 g/dL - - -  Total Bilirubin 0.3 - 1.2 mg/dL - - -  Alkaline  Phos 38 - 126 U/L - - -  AST 15 - 41 U/L - - -  ALT 0 - 44 U/L - - -   Microbiology: 06/10/2020 CSF culture Streptococcus pneumonia 05/29/2020 blood culture Streptococcus pneumoniae 06/12/2020 blood culture no growth 06/17/20 Epidural abscess culture   Assessment/Plan  Strep pneumo meningitis Strep pneumo bacteremia  Extensive thoracic epidural abscess- underwent laminectomies and evacuation of abscess on 06/17/20  Lumbar facet joint septic arthritis and paraspinal abscess- these abscesses needs to be drained by IR as it is slighly larger than last week. Will discuss with them tomorrow  Pt on ceftriaxone 2 grams IV every 12 hours - will need for atleast 6 weeks.may need more  AKI- resolved  DM- was in DKA on presentation  Anemia  Discussed the management with the daughter at bedside

## 2020-06-18 NOTE — Progress Notes (Signed)
Inpatient Diabetes Program Recommendations  AACE/ADA: New Consensus Statement on Inpatient Glycemic Control (2015)  Target Ranges:  Prepandial:   less than 140 mg/dL      Peak postprandial:   less than 180 mg/dL (1-2 hours)      Critically ill patients:  140 - 180 mg/dL   Lab Results  Component Value Date   GLUCAP 352 (H) 06/18/2020   HGBA1C 8.0 (H) 06/09/2020    Review of Glycemic Control Results for CYANA, SHOOK (MRN [id number]) as of 06/18/2020 10:16  Ref. Range 06/17/2020 08:10 06/17/2020 11:32 06/17/2020 16:16 06/17/2020 20:26 06/18/2020 07:39  Glucose-Capillary Latest Ref Range: 70 - 99 mg/dL 240 (H) 293 (H) 269 (H) 247 (H) 352 (H)   Diabetes history: DM 2 Outpatient Diabetes medications:  Glargine-Lixisenatide 100-33--- 48 units before breakfast Metformin 500 mg bid Current orders for Inpatient glycemic control:  Novolog moderate tid with meals and HS Lantus 30 units daily (increased today) Inpatient Diabetes Program Recommendations:   Please add Novolog meal coverage 4 units tid with meals (hold if patient eats less than 50% or NPO).   Thanks,  Adah Perl, RN, BC-ADM Inpatient Diabetes Coordinator Pager [phone] (8a-5p)

## 2020-06-18 NOTE — Progress Notes (Addendum)
Tonya Myers  YNW:[id number] DOB: [birth year] DOA: 06/09/2020 PCP: [name], MD    Brief Narrative:  55 year old with a history of DM, CLL, and chronic back pain who presented to [facility] with acute onset of AMS and the inability to talk.  MRI of the brain was negative for stroke.  She was found to be in DKA and also suffering with acute kidney injury.  During the course of her hospital stay she became febrile.  A full evaluation ultimately revealed the diagnosis of pneumococcal sepsis and pneumococcal meningitis as confirmed with blood cultures and lumbar puncture.  MRI of the spine revealed a paraspinal abscess as well as an epidural abscess.  Neurosurgery and IR have been consulted and are participating in her care.  Worsening of the patient's peripheral neurologic symptoms led to a repeat MRI of the lumbar spine 2/21 which confirmed an enlarging paraspinal abscess.  Repeat MRI of the cervical and thoracic spine 2/22 noted an enlarging dorsal epidural fluid collection.  Given these findings the patient was taken to the OR by neurosurgery 2/22.  Antimicrobials:  Ceftriaxone 2/15 > Cefepime 2/15 Vancomycin 2/15 > 2/17  DVT prophylaxis: Lovenox to begin after lumbar aspiration - SCDs for now  Subjective: Afebrile.  Blood pressure trending up with systolics in the 657 range.  Oxygen saturations stable on 2 L support.  Mental status appears to be slowly improving each day.  Patient reports significant improvement in strength and sensation of bilateral lower extremities postoperatively.  Denies chest pain or shortness of breath.  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 Patient failed conservative antibiotic therapy - Neurosurgery took the patient to the OR 2/22 for a T3/4 Laminectomy, T5-7 laminectomy, T9/10 Laminectomy and evacuation of abscess - still requires drainage of lumbar paraspinal abscess to be accomplished by IR, which is  planned for 8/46  Acute metabolic encephalopathy Due to above - slowly improving   Acute kidney injury Creatinine 1.56 at presentation - resolved w/ supportive care  Recent Labs  Lab 06/12/20 1027 06/13/20 0708 06/14/20 0446 06/16/20 0502 06/17/20 0640  CREATININE 1.13* 1.21* 1.12* 0.86 0.93    Severely uncontrolled DM2 with DKA at presentation CBG poorly controlled - adjust treatment and follow  Anemia Likely due primarily to severe smoldering infection -follow trend  Essential HTN Blood pressure trending upward -adjust treatment and follow  Obesity - Body mass index is 31.28 kg/m.  CLL  Hyponatremia Recheck in a.m.  Hypomagnesemia Recheck in a.m.   Code Status: FULL CODE Family Communication: spoke w/ daughter at bedside  Status is: Inpatient  Remains inpatient appropriate because:Inpatient level of care appropriate due to severity of illness   Dispo: The patient is from: Home              Anticipated d/c is to: unclear              Anticipated d/c date is: > 3 days              Patient currently is not medically stable to d/c.   Difficult to place patient No   Consultants:  Neurosurgery ID Neurology IR  Objective: Blood pressure (!) 176/86, pulse 98, temperature 98.9 F (37.2 C), temperature source Oral, resp. rate 18, height 6' (1.829 m), weight 104.6 kg, SpO2 96 %.  Intake/Output Summary (Last 24 hours) at 06/18/2020 0945 Last data filed at 06/18/2020 9629 Gross per 24 hour  Intake 1341.68 ml  Output 900 ml  Net  441.68 ml   Filed Weights   06/17/20 0351 06/17/20 1621 06/18/20 0500  Weight: 109.1 kg 109.1 kg 104.6 kg    Examination: General: No acute respiratory distress Lungs: Clear to auscultation bilaterally without wheezes or crackles Cardiovascular: Regular rate and rhythm without murmur gallop or rub normal S1 and S2 Abdomen: Nontender, nondistended, soft, bowel sounds positive, no rebound, no ascites, no appreciable  mass Extremities: No significant cyanosis, clubbing, or edema bilateral lower extremities  CBC: Recent Labs  Lab 06/13/20 1702 06/14/20 0446 06/16/20 0502 06/17/20 0640  WBC 11.4* 12.5* 11.7* 11.0*  NEUTROABS 8.2*  --   --   --   HGB 7.1* 8.2* 8.7* 8.6*  HCT 21.7* 25.4* 28.1* 27.1*  MCV 80.4 80.6 81.9 81.1  PLT 181 230 255 992   Basic Metabolic Panel: Recent Labs  Lab 06/14/20 0446 06/15/20 0425 06/16/20 0502 06/17/20 0640 06/18/20 0615  NA 153* 152* 139 137  --   K 4.0  --  3.6 3.4*  --   CL 122*  --  109 104  --   CO2 23  --  23 26  --   GLUCOSE 303*  --  305* 276*  --   BUN 41*  --  19 15  --   CREATININE 1.12*  --  0.86 0.93  --   CALCIUM 8.6*  --  8.3* 8.3*  --   MG 2.9* 1.6* 1.8 1.7 1.8  PHOS 3.7 2.3* 2.8 2.7 3.3   GFR: Estimated Creatinine Clearance: 91.4 mL/min (by C-G formula based on SCr of 0.93 mg/dL).  Liver Function Tests: Recent Labs  Lab 06/12/20 1027  AST 29  ALT 14  ALKPHOS 133*  BILITOT 1.4*  PROT 5.9*  ALBUMIN 1.7*    HbA1C: Hgb A1c MFr Bld  Date/Time Value Ref Range Status  06/09/2020 08:37 PM 8.0 (H) 4.8 - 5.6 % Final    Comment:    (NOTE)         Prediabetes: 5.7 - 6.4         Diabetes: >6.4         Glycemic control for adults with diabetes: <7.0     CBG: Recent Labs  Lab 06/17/20 0810 06/17/20 1132 06/17/20 1616 06/17/20 2026 06/18/20 0739  GLUCAP 240* 293* 269* 247* 352*    Recent Results (from the past 240 hour(s))  Resp Panel by RT-PCR (Flu A&B, Covid) Nasopharyngeal Swab     Status: None   Collection Time: 06/09/20  9:45 PM   Specimen: Nasopharyngeal Swab; Nasopharyngeal(NP) swabs in vial transport medium  Result Value Ref Range Status   SARS Coronavirus 2 by RT PCR NEGATIVE NEGATIVE Final    Comment: (NOTE) SARS-CoV-2 target nucleic acids are NOT DETECTED.  The SARS-CoV-2 RNA is generally detectable in upper respiratory specimens during the acute phase of infection. The lowest concentration of SARS-CoV-2  viral copies this assay can detect is 138 copies/mL. A negative result does not preclude SARS-Cov-2 infection and should not be used as the sole basis for treatment or other patient management decisions. A negative result may occur with  improper specimen collection/handling, submission of specimen other than nasopharyngeal swab, presence of viral mutation(s) within the areas targeted by this assay, and inadequate number of viral copies(<138 copies/mL). A negative result must be combined with clinical observations, patient history, and epidemiological information. The expected result is Negative.  Fact Sheet for Patients:  EntrepreneurPulse.com.au  Fact Sheet for Healthcare Providers:  IncredibleEmployment.be  This test is no t  yet approved or cleared by the [place] and  has been authorized for detection and/or diagnosis of SARS-CoV-2 by FDA under an Emergency Use Authorization (EUA). This EUA will remain  in effect (meaning this test can be used) for the duration of the COVID-19 declaration under Section 564(b)(1) of the Act, 21 U.S.C.section 360bbb-3(b)(1), unless the authorization is terminated  or revoked sooner.       Influenza A by PCR NEGATIVE NEGATIVE Final   Influenza B by PCR NEGATIVE NEGATIVE Final    Comment: (NOTE) The Xpert Xpress SARS-CoV-2/FLU/RSV plus assay is intended as an aid in the diagnosis of influenza from Nasopharyngeal swab specimens and should not be used as a sole basis for treatment. Nasal washings and aspirates are unacceptable for Xpert Xpress SARS-CoV-2/FLU/RSV testing.  Fact Sheet for Patients: EntrepreneurPulse.com.au  Fact Sheet for Healthcare Providers: IncredibleEmployment.be  This test is not yet approved or cleared by the [place] FDA and has been authorized for detection and/or diagnosis of SARS-CoV-2 by FDA under an Emergency Use Authorization  (EUA). This EUA will remain in effect (meaning this test can be used) for the duration of the COVID-19 declaration under Section 564(b)(1) of the Act, 21 U.S.C. section 360bbb-3(b)(1), unless the authorization is terminated or revoked.  Performed at [facility], [address] [place], [place] [address]   CULTURE, BLOOD (ROUTINE X 2) w Reflex to ID Panel     Status: Abnormal   Collection Time: 06/10/20  9:40 AM   Specimen: BLOOD  Result Value Ref Range Status   Specimen Description   Final    BLOOD BLOOD LEFT FOREARM Performed at [facility], [address] [place], [place] [address]    Special Requests   Final    BOTTLES DRAWN AEROBIC AND ANAEROBIC Blood Culture adequate volume Performed at [facility], [address] [place], [place] [address]    Culture  Setup Time   Final    Organism ID to follow IN BOTH AEROBIC AND ANAEROBIC BOTTLES GRAM POSITIVE COCCI CRITICAL RESULT CALLED TO, READ BACK BY AND VERIFIED WITH: Blanchard @1941  ON 06/10/20 SKL Performed at [facility], [address] [place], [place] [address]    Culture STREPTOCOCCUS PNEUMONIAE (A)  Final   Report Status 06/13/2020 FINAL  Final   Organism ID, Bacteria STREPTOCOCCUS PNEUMONIAE  Final      Susceptibility   Streptococcus pneumoniae - MIC*    ERYTHROMYCIN >=8 RESISTANT Resistant     LEVOFLOXACIN 0.5 SENSITIVE Sensitive     VANCOMYCIN 0.5 SENSITIVE Sensitive     PENICILLIN (meningitis) <=0.06 SENSITIVE Sensitive     PENO - penicillin <=0.06      PENICILLIN (non-meningitis) <=0.06 SENSITIVE Sensitive     PENICILLIN (oral) <=0.06 SENSITIVE Sensitive     CEFTRIAXONE (non-meningitis) <=0.12 SENSITIVE Sensitive     CEFTRIAXONE (meningitis) <=0.12 SENSITIVE Sensitive     * STREPTOCOCCUS PNEUMONIAE  Blood Culture ID Panel (Reflexed)     Status: Abnormal   Collection Time: 06/10/20  9:40 AM  Result Value Ref Range Status   Enterococcus faecalis NOT DETECTED NOT  DETECTED Final   Enterococcus Faecium NOT DETECTED NOT DETECTED Final   Listeria monocytogenes NOT DETECTED NOT DETECTED Final   Staphylococcus species NOT DETECTED NOT DETECTED Final   Staphylococcus aureus (BCID) NOT DETECTED NOT DETECTED Final   Staphylococcus epidermidis NOT DETECTED NOT DETECTED Final   Staphylococcus lugdunensis NOT DETECTED NOT DETECTED Final   Streptococcus species DETECTED (A) NOT DETECTED Final  Comment: CRITICAL RESULT CALLED TO, READ BACK BY AND VERIFIED WITH: SUSAN WATSON @1941  ON 06/10/20 SKL    Streptococcus agalactiae NOT DETECTED NOT DETECTED Final   Streptococcus pneumoniae DETECTED (A) NOT DETECTED Final    Comment: CRITICAL RESULT CALLED TO, READ BACK BY AND VERIFIED WITH: SUSAN WATSON @1941  ON 06/10/20 SKL    Streptococcus pyogenes NOT DETECTED NOT DETECTED Final   A.calcoaceticus-baumannii NOT DETECTED NOT DETECTED Final   Bacteroides fragilis NOT DETECTED NOT DETECTED Final   Enterobacterales NOT DETECTED NOT DETECTED Final   Enterobacter cloacae complex NOT DETECTED NOT DETECTED Final   Escherichia coli NOT DETECTED NOT DETECTED Final   Klebsiella aerogenes NOT DETECTED NOT DETECTED Final   Klebsiella oxytoca NOT DETECTED NOT DETECTED Final   Klebsiella pneumoniae NOT DETECTED NOT DETECTED Final   Proteus species NOT DETECTED NOT DETECTED Final   Salmonella species NOT DETECTED NOT DETECTED Final   Serratia marcescens NOT DETECTED NOT DETECTED Final   Haemophilus influenzae NOT DETECTED NOT DETECTED Final   Neisseria meningitidis NOT DETECTED NOT DETECTED Final   Pseudomonas aeruginosa NOT DETECTED NOT DETECTED Final   Stenotrophomonas maltophilia NOT DETECTED NOT DETECTED Final   Candida albicans NOT DETECTED NOT DETECTED Final   Candida auris NOT DETECTED NOT DETECTED Final   Candida glabrata NOT DETECTED NOT DETECTED Final   Candida krusei NOT DETECTED NOT DETECTED Final   Candida parapsilosis NOT DETECTED NOT DETECTED Final   Candida  tropicalis NOT DETECTED NOT DETECTED Final   Cryptococcus neoformans/gattii NOT DETECTED NOT DETECTED Final    Comment: Performed at [facility], [address] [place], [place] [address]  CULTURE, BLOOD (ROUTINE X 2) w Reflex to ID Panel     Status: None   Collection Time: 06/10/20 11:26 AM   Specimen: BLOOD  Result Value Ref Range Status   Specimen Description BLOOD LEFT ARM  Final   Special Requests   Final    BOTTLES DRAWN AEROBIC ONLY Blood Culture adequate volume   Culture   Final    NO GROWTH 5 DAYS Performed at [facility], [address] [place], [place] [address]    Report Status 06/15/2020 FINAL  Final  CSF culture w Stat Gram Stain     Status: None   Collection Time: 06/10/20  2:19 PM   Specimen: CSF; Cerebrospinal Fluid  Result Value Ref Range Status   Specimen Description   Final    CSF Performed at [facility], [address] [place], [place] [address]    Special Requests   Final    NONE Performed at [facility], [address] [place], [place] [address]    Gram Stain   Final    GRAM POSITIVE DIPLOCOCCI CRITICAL RESULT CALLED TO, READ BACK BY AND VERIFIED WITH: KORY ANDREWS @1627  06/10/20 MJU WBC SEEN RBC SEEN Performed at [facility], [address] [place], [place] [address]    Culture MODERATE STREPTOCOCCUS PNEUMONIAE  Final   Report Status 06/12/2020 FINAL  Final   Organism ID, Bacteria STREPTOCOCCUS PNEUMONIAE  Final      Susceptibility   Streptococcus pneumoniae - MIC*    ERYTHROMYCIN >=8 RESISTANT Resistant     LEVOFLOXACIN 1 SENSITIVE Sensitive     VANCOMYCIN 0.25 SENSITIVE Sensitive     PENICILLIN (meningitis) <=0.06 SENSITIVE Sensitive     PENO - penicillin <=0.06      PENICILLIN (non-meningitis) <=0.06 SENSITIVE Sensitive     PENICILLIN (oral) <=0.06 SENSITIVE Sensitive  CEFTRIAXONE (non-meningitis) <=0.12 SENSITIVE Sensitive     CEFTRIAXONE (meningitis) <=0.12 SENSITIVE Sensitive      * MODERATE STREPTOCOCCUS PNEUMONIAE  MRSA PCR Screening     Status: None   Collection Time: 06/11/20  4:56 AM   Specimen: Nasopharyngeal  Result Value Ref Range Status   MRSA by PCR NEGATIVE NEGATIVE Final    Comment:        The GeneXpert MRSA Assay (FDA approved for NASAL specimens only), is one component of a comprehensive MRSA colonization surveillance program. It is not intended to diagnose MRSA infection nor to guide or monitor treatment for MRSA infections. Performed at [facility], [address] [place], [place] [address]   Culture, blood (Routine X 2) w Reflex to ID Panel     Status: None   Collection Time: 06/12/20 12:41 AM   Specimen: BLOOD  Result Value Ref Range Status   Specimen Description BLOOD BLOOD RIGHT HAND  Final   Special Requests   Final    BOTTLES DRAWN AEROBIC AND ANAEROBIC Blood Culture adequate volume   Culture   Final    NO GROWTH 5 DAYS Performed at [facility], [address], [place], [place] [address]    Report Status 06/17/2020 FINAL  Final  Culture, blood (Routine X 2) w Reflex to ID Panel     Status: None   Collection Time: 06/12/20 12:41 AM   Specimen: BLOOD  Result Value Ref Range Status   Specimen Description BLOOD BLOOD LEFT HAND  Final   Special Requests IN PEDIATRIC BOTTLE Blood Culture adequate volume  Final   Culture   Final    NO GROWTH 5 DAYS Performed at [facility], [address] [place], [place] [address]    Report Status 06/17/2020 FINAL  Final  Aerobic/Anaerobic Culture w Gram Stain (surgical/deep wound)     Status: None (Preliminary result)   Collection Time: 06/17/20  5:52 PM   Specimen: PATH Other; Body Fluid  Result Value Ref Range Status   Specimen Description   Final    WOUND Performed at [facility], [address] [place], [place] [address]    Special Requests   Final    EPIDURAL Performed at [facility], [address] [place], [place] [address]     Gram Stain   Final    ABUNDANT WBC PRESENT, PREDOMINANTLY PMN NO ORGANISMS SEEN Performed at [facility], [address], [place], [place] [address]    Culture PENDING  Incomplete   Report Status PENDING  Incomplete  Aerobic/Anaerobic Culture w Gram Stain (surgical/deep wound)     Status: None (Preliminary result)   Collection Time: 06/17/20  5:55 PM   Specimen: PATH Other; Body Fluid  Result Value Ref Range Status   Specimen Description   Final    WOUND Performed at [facility], [address], [place], [place] [address]    Special Requests   Final    THORACIC Performed at [facility], [address] [place], [place] [address]    Gram Stain   Final    RARE WBC PRESENT, PREDOMINANTLY PMN NO ORGANISMS SEEN Performed at [facility], [place] [address] [place], [place] [address]    Culture PENDING  Incomplete   Report Status PENDING  Incomplete     Scheduled Meds: . amLODipine  10 mg Oral Daily  . chlorhexidine  15 mL Mouth Rinse BID  . Chlorhexidine Gluconate Cloth  6 each Topical  [id number]  . gabapentin  200 mg Oral BID  . hydrochlorothiazide  12.5 mg Oral Daily  . insulin aspart  0-5 Units Subcutaneous QHS  . insulin aspart  0-9 Units Subcutaneous TID WC  . insulin glargine  24 Units Subcutaneous Daily  . levothyroxine  150 mcg Oral Q0600  . losartan  100 mg Oral Daily  . magnesium oxide  400 mg Oral Daily  . mouth rinse  15 mL Mouth Rinse q12n4p  . pantoprazole (PROTONIX) IV  40 mg Intravenous Q24H  . potassium chloride  20 mEq Oral Daily  . sodium chloride flush  10-40 mL Intracatheter Q12H  . [START ON 06/19/2020] thiamine injection  100 mg Intravenous Daily  . traZODone  50 mg Oral QHS   Continuous Infusions: . sodium chloride 10 mL/hr at 06/18/20 0607  . cefTRIAXone (ROCEPHIN)  IV Stopped (06/18/20 0558)     LOS: 9 days   Cherene Altes, MD [facility] Office  [phone] Pager - Text Page per Amion  If  7PM-7AM, please contact night-coverage per Amion 06/18/2020, 9:45 AM

## 2020-06-18 NOTE — Progress Notes (Signed)
    Attending Progress Note  History: Shaterrica Territo is here for epidural abscess.  POD1: Ms. Tercero did well overnight.  She has increased movement and feeling in her legs.    Physical Exam: Vitals:   06/18/20 0710 06/18/20 1106  BP: (!) 176/86 (!) 142/87  Pulse: 98 99  Resp: 18 17  Temp: 98.9 F (37.2 C) 98.9 F (37.2 C)  SpO2: 96% 95%    AA Ox3 CNI  Strength:  UE diffusely 4-4+/5 except grip, which is 4- 5/5 throughout BLE except IP which is 4/5 bilaterally  Data:  Recent Labs  Lab 06/14/20 0446 06/15/20 0425 06/16/20 0502 06/17/20 0640  NA 153*   < > 139 137  K 4.0  --  3.6 3.4*  CL 122*  --  109 104  CO2 23  --  23 26  BUN 41*  --  19 15  CREATININE 1.12*  --  0.86 0.93  GLUCOSE 303*  --  305* 276*  CALCIUM 8.6*  --  8.3* 8.3*   < > = values in this interval not displayed.   Recent Labs  Lab 06/12/20 1027  AST 29  ALT 14  ALKPHOS 133*     Recent Labs  Lab 06/14/20 0446 06/16/20 0502 06/17/20 0640  WBC 12.5* 11.7* 11.0*  HGB 8.2* 8.7* 8.6*  HCT 25.4* 28.1* 27.1*  PLT 230 255 257   No results for input(s): APTT, INR in the last 168 hours.       Other tests/results: cultures pending  Drains - not yet recorded  Assessment/Plan:  Modestine Scherzinger is doing well after multilevel laminectomy for washout of epidural abscesses.  She also has cervical stenosis and myelopathy, but this is clinically stable.  - continue drains - Abx per ID - pain control - DVT prophylaxis ok - PTOT   Meade Maw MD, [facility] Department of Neurosurgery

## 2020-06-18 NOTE — Anesthesia Postprocedure Evaluation (Signed)
Anesthesia Post Note  Patient: Tonya Myers  Procedure(s) Performed: THORACIC LAMINECTOMY FOR EPIDURAL ABSCESS (Bilateral )  Patient location during evaluation: PACU Anesthesia Type: General Level of consciousness: awake and alert and confused (preOp baseline) Pain management: pain level controlled Vital Signs Assessment: post-procedure vital signs reviewed and stable Respiratory status: spontaneous breathing, nonlabored ventilation, respiratory function stable and patient connected to nasal cannula oxygen Cardiovascular status: blood pressure returned to baseline and stable Postop Assessment: no apparent nausea or vomiting Anesthetic complications: no   No complications documented.   Last Vitals:  Vitals:   06/17/20 2130 06/18/20 0032  BP: (!) 156/88 (!) 152/97  Pulse: 83 (!) 103  Resp: 18 20  Temp: 36.7 C 36.7 C  SpO2: 95% 90%    Last Pain:  Vitals:   06/18/20 0000  TempSrc:   PainSc: 0-No pain                 Precious Haws [name]

## 2020-06-19 ENCOUNTER — Inpatient Hospital Stay: Payer: Medicare Other

## 2020-06-19 DIAGNOSIS — M4620 Osteomyelitis of vertebra, site unspecified: Secondary | ICD-10-CM | POA: Diagnosis not present

## 2020-06-19 DIAGNOSIS — G062 Extradural and subdural abscess, unspecified: Secondary | ICD-10-CM | POA: Diagnosis not present

## 2020-06-19 DIAGNOSIS — A491 Streptococcal infection, unspecified site: Secondary | ICD-10-CM

## 2020-06-19 DIAGNOSIS — E039 Hypothyroidism, unspecified: Secondary | ICD-10-CM

## 2020-06-19 DIAGNOSIS — G009 Bacterial meningitis, unspecified: Secondary | ICD-10-CM | POA: Diagnosis not present

## 2020-06-19 DIAGNOSIS — R7881 Bacteremia: Secondary | ICD-10-CM | POA: Diagnosis not present

## 2020-06-19 LAB — COMPREHENSIVE METABOLIC PANEL
ALT: 37 U/L (ref 0–44)
AST: 42 U/L — ABNORMAL HIGH (ref 15–41)
Albumin: 1.9 g/dL — ABNORMAL LOW (ref 3.5–5.0)
Alkaline Phosphatase: 207 U/L — ABNORMAL HIGH (ref 38–126)
Anion gap: 10 (ref 5–15)
BUN: 18 mg/dL (ref 6–20)
CO2: 24 mmol/L (ref 22–32)
Calcium: 7.5 mg/dL — ABNORMAL LOW (ref 8.9–10.3)
Chloride: 104 mmol/L (ref 98–111)
Creatinine, Ser: 0.83 mg/dL (ref 0.44–1.00)
GFR, Estimated: >60 mL/min (ref >60)
Glucose, Bld: 343 mg/dL — ABNORMAL HIGH (ref 70–99)
Potassium: 3.6 mmol/L (ref 3.5–5.1)
Sodium: 138 mmol/L (ref 135–145)
Total Bilirubin: 0.6 mg/dL (ref 0.3–1.2)
Total Protein: 5.7 g/dL — ABNORMAL LOW (ref 6.5–8.1)

## 2020-06-19 LAB — CBC
HCT: 25.5 % — ABNORMAL LOW (ref 36.0–46.0)
Hemoglobin: 8 g/dL — ABNORMAL LOW (ref 12.0–15.0)
MCH: 25.7 pg — ABNORMAL LOW (ref 26.0–34.0)
MCHC: 31.4 g/dL (ref 30.0–36.0)
MCV: 82 fL (ref 80.0–100.0)
Platelets: 385 10*3/uL (ref 150–400)
RBC: 3.11 MIL/uL — ABNORMAL LOW (ref 3.87–5.11)
RDW: 17.1 % — ABNORMAL HIGH (ref 11.5–15.5)
WBC: 11.7 10*3/uL — ABNORMAL HIGH (ref 4.0–10.5)
nRBC: 0 % (ref 0.0–0.2)

## 2020-06-19 LAB — GLUCOSE, CAPILLARY
Glucose-Capillary: 345 mg/dL — ABNORMAL HIGH (ref 70–99)
Glucose-Capillary: 293 mg/dL — ABNORMAL HIGH (ref 70–99)
Glucose-Capillary: 248 mg/dL — ABNORMAL HIGH (ref 70–99)
Glucose-Capillary: 222 mg/dL — ABNORMAL HIGH (ref 70–99)

## 2020-06-19 LAB — MAGNESIUM: Magnesium: 1.7 mg/dL (ref 1.7–2.4)

## 2020-06-19 IMAGING — US US FINE NEEDLE ASP 1ST LESION
1 series · 4 of 4 positions shown · non-contrast
Comparison: none

INDICATION: 54-year-old female referred for aspiration of paraspinal fluid
lumbar region

[Series 1: us fine needle asp 1st lesion · 4 acquisitions, 4 frames shown]
[im 1/4]
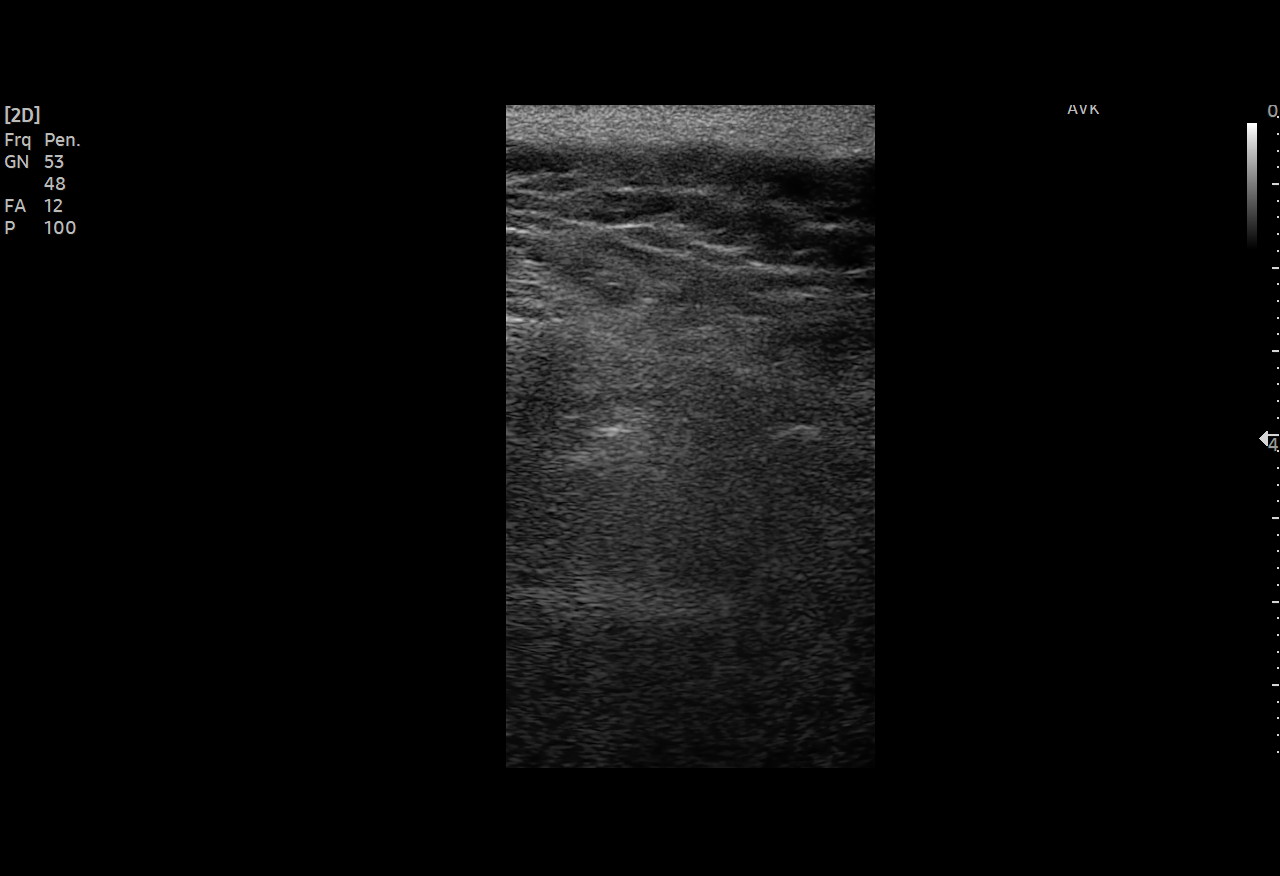
[im 2/4]
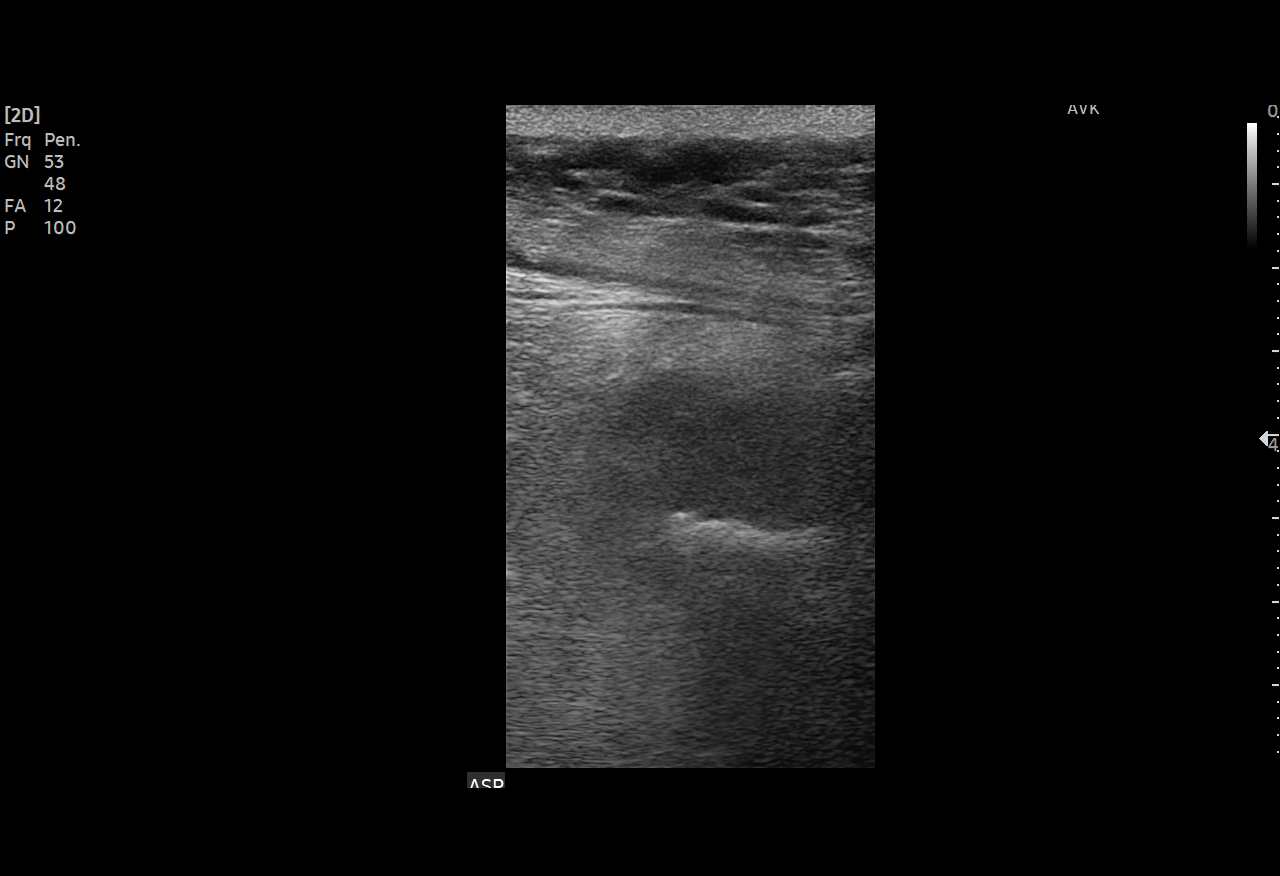
[im 3/4]
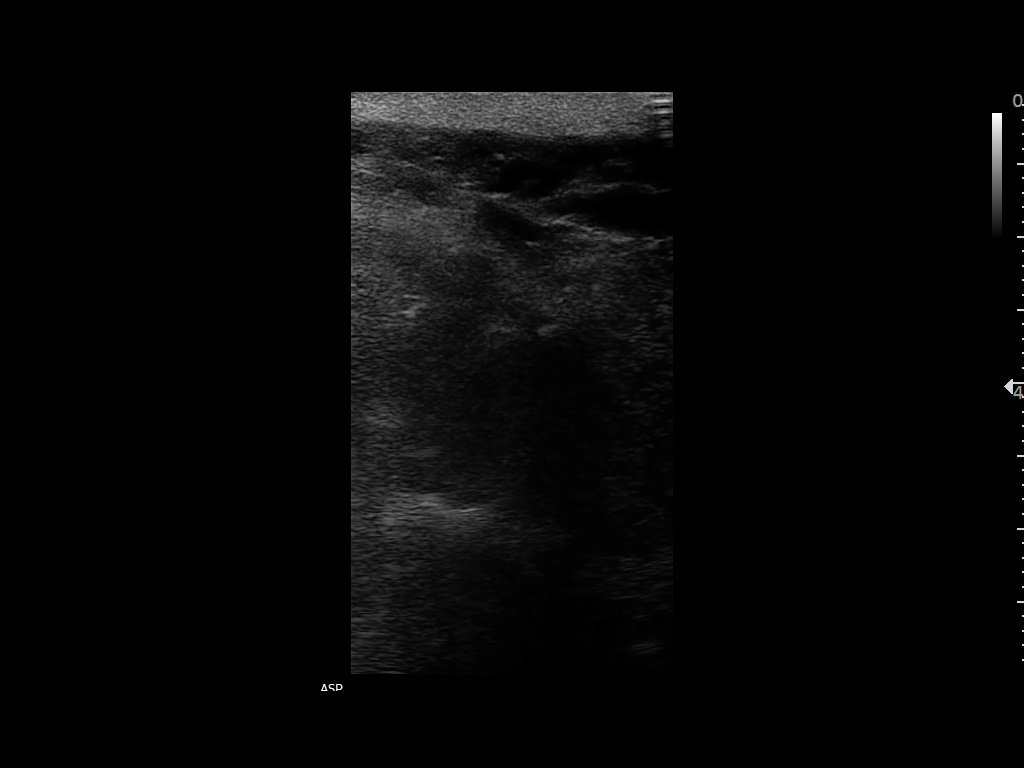
[im 4/4]
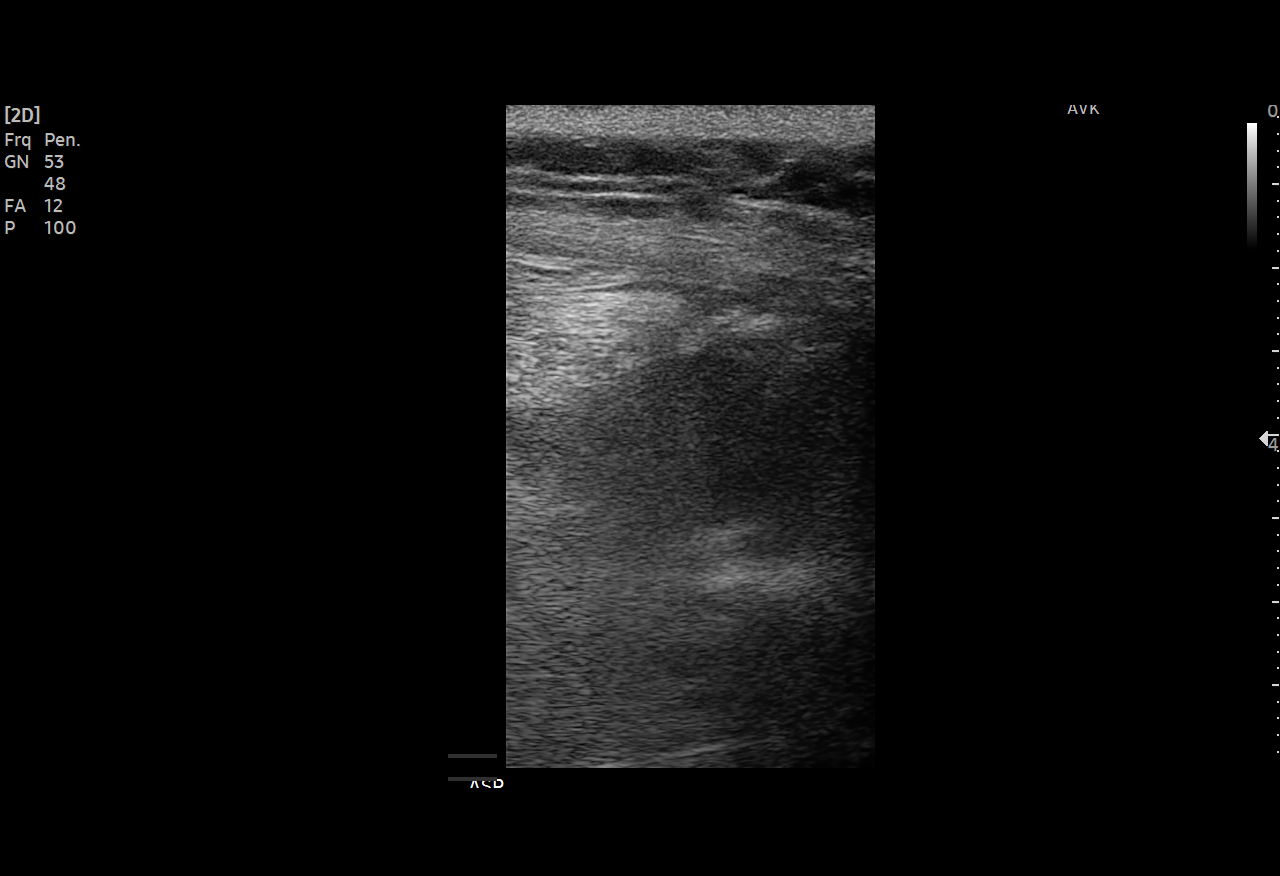

[4 of 4 positions shown; findings below may reference images not displayed]

EXAM:
ULTRASOUND-GUIDED ASPIRATION

MEDICATIONS:
The patient is currently admitted to the hospital and receiving
intravenous antibiotics. The antibiotics were administered within an
appropriate time frame prior to the initiation of the procedure.

ANESTHESIA/SEDATION:
Fentanyl 25 mcg IV; Versed 0.5 mg IV

Moderate Sedation Time:  12 minutes

The patient was continuously monitored during the procedure by the
interventional radiology nurse under my direct supervision.

COMPLICATIONS:
None

PROCEDURE:
Informed written consent was obtained from the patient after a
thorough discussion of the procedural risks, benefits and
alternatives. All questions were addressed. Maximal Sterile Barrier
Technique was utilized including caps, mask, sterile gowns, sterile
gloves, sterile drape, hand hygiene and skin antiseptic. A timeout
was performed prior to the initiation of the procedure.

Patient position prone position with ultrasound images stored sent
to PACs.

Once the patient is prepped and draped in the usual sterile fashion,
1% lidocaine was used for local anesthesia. Small stab incision was
made with 11 blade scalpel. Yueh needle was advanced with ultrasound
guidance into the hypoechoic region within the paraspinal
musculature. Two separate needle position was required for the
initial aspiration of approximately 1 cc-2 cc of purulent fluid from
the left-sided lumbar para muscular GIORGI. Sample was sent for
culture.

A third needle position was achieved with another Yueh needle under
ultrasound guidance, with some scant complex bloody fluid aspirated.

Final image was stored.

Patient tolerated the procedure well and remained hemodynamically
stable throughout.

No complications were encountered and no significant blood loss.
IMPRESSION: Status post ultrasound-guided aspiration of paraspinal muscular
fluid collection, yielding approximately 3 cc of purulent material.
Sample was sent for culture.

Overall ultrasound appearance is compatible with edema without a
focal formed abscess.

## 2020-06-19 MED ORDER — FENTANYL CITRATE (PF) 100 MCG/2ML IJ SOLN
INTRAMUSCULAR | Status: AC
  Filled 2020-06-19: qty 2

## 2020-06-19 MED ORDER — INSULIN GLARGINE 100 UNIT/ML ~~LOC~~ SOLN
36.0000 [IU] | Freq: Every day | SUBCUTANEOUS | Status: DC
  Administered 2020-06-19 – 2020-06-24 (×6): 36 [IU] via SUBCUTANEOUS
  Filled 2020-06-19 (×7): qty 0.36

## 2020-06-19 MED ORDER — INSULIN ASPART 100 UNIT/ML ~~LOC~~ SOLN
6.0000 [IU] | Freq: Three times a day (TID) | SUBCUTANEOUS | Status: DC
  Administered 2020-06-19 – 2020-06-21 (×5): 6 [IU] via SUBCUTANEOUS
  Filled 2020-06-19 (×6): qty 1

## 2020-06-19 MED ORDER — MIDAZOLAM HCL 5 MG/5ML IJ SOLN
INTRAMUSCULAR | Status: AC
  Filled 2020-06-19: qty 5

## 2020-06-19 MED ORDER — FENTANYL CITRATE (PF) 100 MCG/2ML IJ SOLN
INTRAMUSCULAR | Status: AC | PRN
  Administered 2020-06-19: 25 ug via INTRAVENOUS

## 2020-06-19 MED ORDER — MIDAZOLAM HCL 5 MG/5ML IJ SOLN
INTRAMUSCULAR | Status: AC | PRN
  Administered 2020-06-19: 0.5 mg via INTRAVENOUS

## 2020-06-19 NOTE — Progress Notes (Signed)
SLP Cancellation Note  Patient Details Name: Tonya Myers MRN: [id number] DOB: Jun 28, 1965   Cancelled treatment:        Patient NPO for IR procedure, SLP to reattempt this afternoon.  Deneise Lever, [place], CCC-SLP Speech-Language Pathologist                                                                                                 Aliene Altes 06/19/2020, 9:29 AM

## 2020-06-19 NOTE — Progress Notes (Signed)
Tonya Myers  EUM:[id number] DOB: 03-05-1966 DOA: 06/09/2020 PCP: [name], MD    Brief Narrative:  55 year old with a history of DM, CLL, and chronic back pain who presented to [facility] with acute onset of AMS and the inability to talk.  MRI of the brain was negative for stroke.  She was found to be in DKA and also suffering with acute kidney injury.  During the course of her hospital stay she became febrile.  A full evaluation ultimately revealed the diagnosis of pneumococcal sepsis and pneumococcal meningitis as confirmed with blood cultures and lumbar puncture.  MRI of the spine revealed a paraspinal abscess as well as an epidural abscess.  Neurosurgery and IR have been consulted and are participating in her care.  Worsening of the patient's peripheral neurologic symptoms led to a repeat MRI of the lumbar spine 2/21 which confirmed an enlarging paraspinal abscess.  Repeat MRI of the cervical and thoracic spine 2/22 noted an enlarging dorsal epidural fluid collection.  Given these findings the patient was taken to the OR by neurosurgery 2/22.  Antimicrobials:  Ceftriaxone 2/15 > Cefepime 2/15 Vancomycin 2/15 > 2/17  DVT prophylaxis: Lovenox to begin after lumbar aspiration - SCDs for now  Subjective: Afebrile.  Vital signs stable.  Resting comfortably in bed post procedure.  Denies new complaints.  Remains mildly confused.  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 Patient failed conservative antibiotic therapy - Neurosurgery took the patient to the OR 2/22 for a T3/4 laminectomy, T5-7 laminectomy, T9/10 laminectomy and evacuation of abscess - drainage of lumbar paraspinal abscess completed by IR today  Acute metabolic encephalopathy Due to above - slowly improving   Acute kidney injury Creatinine 1.56 at presentation - resolved w/ supportive care  Severely uncontrolled DM2 with DKA at presentation CBG remains poorly  controlled - adjust treatment again today and follow  Anemia Likely due primarily to severe smoldering infection - follow trend -no indication for transfusion at this time  Essential HTN Blood pressure trending upward -adjust treatment and follow  Obesity - Body mass index is 31.19 kg/m.  CLL  Hyponatremia Resolved  Hypomagnesemia Resolved   Code Status: FULL CODE Family Communication:  Status is: Inpatient  Remains inpatient appropriate because:Inpatient level of care appropriate due to severity of illness   Dispo: The patient is from: Home              Anticipated d/c is to: unclear              Anticipated d/c date is: > 3 days              Patient currently is not medically stable to d/c.   Difficult to place patient No   Consultants:  Neurosurgery ID Neurology IR  Objective: Blood pressure 138/81, pulse 98, temperature 99.1 F (37.3 C), temperature source Oral, resp. rate 18, height 6' (1.829 m), weight 104.3 kg, SpO2 93 %.  Intake/Output Summary (Last 24 hours) at 06/19/2020 0855 Last data filed at 06/19/2020 0230 Gross per 24 hour  Intake 150 ml  Output 1650 ml  Net -1500 ml   Filed Weights   06/17/20 1621 06/18/20 0500 06/19/20 0500  Weight: 109.1 kg 104.6 kg 104.3 kg    Examination: General: No acute respiratory distress Lungs: Clear to auscultation bilaterally -no wheezing Cardiovascular: RRR without murmur or rub Abdomen: NT/ND, soft, BS positive, no rebound Extremities: No C/C/E bilateral lower extremities  CBC: Recent Labs  Lab  06/13/20 1702 06/14/20 0446 06/16/20 0502 06/17/20 0640 06/19/20 0605  WBC 11.4*   < > 11.7* 11.0* 11.7*  NEUTROABS 8.2*  --   --   --   --   HGB 7.1*   < > 8.7* 8.6* 8.0*  HCT 21.7*   < > 28.1* 27.1* 25.5*  MCV 80.4   < > 81.9 81.1 82.0  PLT 181   < > 255 257 385   < > = values in this interval not displayed.   Basic Metabolic Panel: Recent Labs  Lab 06/16/20 0502 06/17/20 0640 06/18/20 0615  06/19/20 0605  NA 139 137  --  138  K 3.6 3.4*  --  3.6  CL 109 104  --  104  CO2 23 26  --  24  GLUCOSE 305* 276*  --  343*  BUN 19 15  --  18  CREATININE 0.86 0.93  --  0.83  CALCIUM 8.3* 8.3*  --  7.5*  MG 1.8 1.7 1.8 1.7  PHOS 2.8 2.7 3.3  --    GFR: Estimated Creatinine Clearance: 102.3 mL/min (by C-G formula based on SCr of 0.83 mg/dL).  Liver Function Tests: Recent Labs  Lab 06/12/20 1027 06/19/20 0605  AST 29 42*  ALT 14 37  ALKPHOS 133* 207*  BILITOT 1.4* 0.6  PROT 5.9* 5.7*  ALBUMIN 1.7* 1.9*    HbA1C: Hgb A1c MFr Bld  Date/Time Value Ref Range Status  06/09/2020 08:37 PM 8.0 (H) 4.8 - 5.6 % Final    Comment:    (NOTE)         Prediabetes: 5.7 - 6.4         Diabetes: >6.4         Glycemic control for adults with diabetes: <7.0     CBG: Recent Labs  Lab 06/18/20 0739 06/18/20 1223 06/18/20 1559 06/18/20 2142 06/19/20 0747  GLUCAP 352* 326* 321* 312* 345*    Recent Results (from the past 240 hour(s))  Resp Panel by RT-PCR (Flu A&B, Covid) Nasopharyngeal Swab     Status: None   Collection Time: 06/09/20  9:45 PM   Specimen: Nasopharyngeal Swab; Nasopharyngeal(NP) swabs in vial transport medium  Result Value Ref Range Status   SARS Coronavirus 2 by RT PCR NEGATIVE NEGATIVE Final    Comment: (NOTE) SARS-CoV-2 target nucleic acids are NOT DETECTED.  The SARS-CoV-2 RNA is generally detectable in upper respiratory specimens during the acute phase of infection. The lowest concentration of SARS-CoV-2 viral copies this assay can detect is 138 copies/mL. A negative result does not preclude SARS-Cov-2 infection and should not be used as the sole basis for treatment or other patient management decisions. A negative result may occur with  improper specimen collection/handling, submission of specimen other than nasopharyngeal swab, presence of viral mutation(s) within the areas targeted by this assay, and inadequate number of viral copies(<138  copies/mL). A negative result must be combined with clinical observations, patient history, and epidemiological information. The expected result is Negative.  Fact Sheet for Patients:  EntrepreneurPulse.com.au  Fact Sheet for Healthcare Providers:  IncredibleEmployment.be  This test is no t yet approved or cleared by the [place] FDA and  has been authorized for detection and/or diagnosis of SARS-CoV-2 by FDA under an Emergency Use Authorization (EUA). This EUA will remain  in effect (meaning this test can be used) for the duration of the COVID-19 declaration under Section 564(b)(1) of the Act, 21 U.S.C.section 360bbb-3(b)(1), unless the authorization is terminated  or  revoked sooner.       Influenza A by PCR NEGATIVE NEGATIVE Final   Influenza B by PCR NEGATIVE NEGATIVE Final    Comment: (NOTE) The Xpert Xpress SARS-CoV-2/FLU/RSV plus assay is intended as an aid in the diagnosis of influenza from Nasopharyngeal swab specimens and should not be used as a sole basis for treatment. Nasal washings and aspirates are unacceptable for Xpert Xpress SARS-CoV-2/FLU/RSV testing.  Fact Sheet for Patients: EntrepreneurPulse.com.au  Fact Sheet for Healthcare Providers: IncredibleEmployment.be  This test is not yet approved or cleared by the [place] FDA and has been authorized for detection and/or diagnosis of SARS-CoV-2 by FDA under an Emergency Use Authorization (EUA). This EUA will remain in effect (meaning this test can be used) for the duration of the COVID-19 declaration under Section 564(b)(1) of the Act, 21 U.S.C. section 360bbb-3(b)(1), unless the authorization is terminated or revoked.  Performed at [facility], [address] [place], [place] [address]   CULTURE, BLOOD (ROUTINE X 2) w Reflex to ID Panel     Status: Abnormal   Collection Time: 06/10/20  9:40 AM   Specimen: BLOOD   Result Value Ref Range Status   Specimen Description   Final    BLOOD BLOOD LEFT FOREARM Performed at [facility], [address] [place], [place] [address]    Special Requests   Final    BOTTLES DRAWN AEROBIC AND ANAEROBIC Blood Culture adequate volume Performed at [facility], [address] [place], [place] [address]    Culture  Setup Time   Final    Organism ID to follow IN BOTH AEROBIC AND ANAEROBIC BOTTLES GRAM POSITIVE COCCI CRITICAL RESULT CALLED TO, READ BACK BY AND VERIFIED WITH: Hughes @1941  ON 06/10/20 SKL Performed at [facility], [address] [place], [place] [address]    Culture STREPTOCOCCUS PNEUMONIAE (A)  Final   Report Status 06/13/2020 FINAL  Final   Organism ID, Bacteria STREPTOCOCCUS PNEUMONIAE  Final      Susceptibility   Streptococcus pneumoniae - MIC*    ERYTHROMYCIN >=8 RESISTANT Resistant     LEVOFLOXACIN 0.5 SENSITIVE Sensitive     VANCOMYCIN 0.5 SENSITIVE Sensitive     PENICILLIN (meningitis) <=0.06 SENSITIVE Sensitive     PENO - penicillin <=0.06      PENICILLIN (non-meningitis) <=0.06 SENSITIVE Sensitive     PENICILLIN (oral) <=0.06 SENSITIVE Sensitive     CEFTRIAXONE (non-meningitis) <=0.12 SENSITIVE Sensitive     CEFTRIAXONE (meningitis) <=0.12 SENSITIVE Sensitive     * STREPTOCOCCUS PNEUMONIAE  Blood Culture ID Panel (Reflexed)     Status: Abnormal   Collection Time: 06/10/20  9:40 AM  Result Value Ref Range Status   Enterococcus faecalis NOT DETECTED NOT DETECTED Final   Enterococcus Faecium NOT DETECTED NOT DETECTED Final   Listeria monocytogenes NOT DETECTED NOT DETECTED Final   Staphylococcus species NOT DETECTED NOT DETECTED Final   Staphylococcus aureus (BCID) NOT DETECTED NOT DETECTED Final   Staphylococcus epidermidis NOT DETECTED NOT DETECTED Final   Staphylococcus lugdunensis NOT DETECTED NOT DETECTED Final   Streptococcus species DETECTED (A) NOT DETECTED Final    Comment: CRITICAL  RESULT CALLED TO, READ BACK BY AND VERIFIED WITH: SUSAN WATSON @1941  ON 06/10/20 SKL    Streptococcus agalactiae NOT DETECTED NOT DETECTED Final   Streptococcus pneumoniae DETECTED (A) NOT DETECTED Final    Comment: CRITICAL RESULT CALLED TO, READ BACK BY AND VERIFIED WITH: SUSAN WATSON @1941  ON 06/10/20 SKL    Streptococcus pyogenes  NOT DETECTED NOT DETECTED Final   A.calcoaceticus-baumannii NOT DETECTED NOT DETECTED Final   Bacteroides fragilis NOT DETECTED NOT DETECTED Final   Enterobacterales NOT DETECTED NOT DETECTED Final   Enterobacter cloacae complex NOT DETECTED NOT DETECTED Final   Escherichia coli NOT DETECTED NOT DETECTED Final   Klebsiella aerogenes NOT DETECTED NOT DETECTED Final   Klebsiella oxytoca NOT DETECTED NOT DETECTED Final   Klebsiella pneumoniae NOT DETECTED NOT DETECTED Final   Proteus species NOT DETECTED NOT DETECTED Final   Salmonella species NOT DETECTED NOT DETECTED Final   Serratia marcescens NOT DETECTED NOT DETECTED Final   Haemophilus influenzae NOT DETECTED NOT DETECTED Final   Neisseria meningitidis NOT DETECTED NOT DETECTED Final   Pseudomonas aeruginosa NOT DETECTED NOT DETECTED Final   Stenotrophomonas maltophilia NOT DETECTED NOT DETECTED Final   Candida albicans NOT DETECTED NOT DETECTED Final   Candida auris NOT DETECTED NOT DETECTED Final   Candida glabrata NOT DETECTED NOT DETECTED Final   Candida krusei NOT DETECTED NOT DETECTED Final   Candida parapsilosis NOT DETECTED NOT DETECTED Final   Candida tropicalis NOT DETECTED NOT DETECTED Final   Cryptococcus neoformans/gattii NOT DETECTED NOT DETECTED Final    Comment: Performed at [facility], [address] [place], [place] [address]  CULTURE, BLOOD (ROUTINE X 2) w Reflex to ID Panel     Status: None   Collection Time: 06/10/20 11:26 AM   Specimen: BLOOD  Result Value Ref Range Status   Specimen Description BLOOD LEFT ARM  Final   Special Requests   Final    BOTTLES DRAWN  AEROBIC ONLY Blood Culture adequate volume   Culture   Final    NO GROWTH 5 DAYS Performed at [facility], [address] [place], [place] [address]    Report Status 06/15/2020 FINAL  Final  CSF culture w Stat Gram Stain     Status: None   Collection Time: 06/10/20  2:19 PM   Specimen: CSF; Cerebrospinal Fluid  Result Value Ref Range Status   Specimen Description   Final    CSF Performed at [facility], [address] [place], [place] [address]    Special Requests   Final    NONE Performed at [facility], [address] [place], [place] [address]    Gram Stain   Final    GRAM POSITIVE DIPLOCOCCI CRITICAL RESULT CALLED TO, READ BACK BY AND VERIFIED WITH: KORY ANDREWS @1627  06/10/20 MJU WBC SEEN RBC SEEN Performed at [facility], [address] [place], [place] [address]    Culture MODERATE STREPTOCOCCUS PNEUMONIAE  Final   Report Status 06/12/2020 FINAL  Final   Organism ID, Bacteria STREPTOCOCCUS PNEUMONIAE  Final      Susceptibility   Streptococcus pneumoniae - MIC*    ERYTHROMYCIN >=8 RESISTANT Resistant     LEVOFLOXACIN 1 SENSITIVE Sensitive     VANCOMYCIN 0.25 SENSITIVE Sensitive     PENICILLIN (meningitis) <=0.06 SENSITIVE Sensitive     PENO - penicillin <=0.06      PENICILLIN (non-meningitis) <=0.06 SENSITIVE Sensitive     PENICILLIN (oral) <=0.06 SENSITIVE Sensitive     CEFTRIAXONE (non-meningitis) <=0.12 SENSITIVE Sensitive     CEFTRIAXONE (meningitis) <=0.12 SENSITIVE Sensitive     * MODERATE STREPTOCOCCUS PNEUMONIAE  MRSA PCR Screening     Status: None   Collection Time: 06/11/20  4:56 AM   Specimen: Nasopharyngeal  Result Value Ref Range Status   MRSA by PCR NEGATIVE NEGATIVE Final  Comment:        The GeneXpert MRSA Assay (FDA approved for NASAL specimens only), is one component of a comprehensive MRSA colonization surveillance program. It is not intended to diagnose MRSA infection nor to guide or monitor  treatment for MRSA infections. Performed at [facility], [address] [place], [place] [address]   Culture, blood (Routine X 2) w Reflex to ID Panel     Status: None   Collection Time: 06/12/20 12:41 AM   Specimen: BLOOD  Result Value Ref Range Status   Specimen Description BLOOD BLOOD RIGHT HAND  Final   Special Requests   Final    BOTTLES DRAWN AEROBIC AND ANAEROBIC Blood Culture adequate volume   Culture   Final    NO GROWTH 5 DAYS Performed at [facility], [address] [place], [place] [address]    Report Status 06/17/2020 FINAL  Final  Culture, blood (Routine X 2) w Reflex to ID Panel     Status: None   Collection Time: 06/12/20 12:41 AM   Specimen: BLOOD  Result Value Ref Range Status   Specimen Description BLOOD BLOOD LEFT HAND  Final   Special Requests IN PEDIATRIC BOTTLE Blood Culture adequate volume  Final   Culture   Final    NO GROWTH 5 DAYS Performed at [facility], [address], [place], [place] [address]    Report Status 06/17/2020 FINAL  Final  Aerobic/Anaerobic Culture w Gram Stain (surgical/deep wound)     Status: None (Preliminary result)   Collection Time: 06/17/20  5:52 PM   Specimen: PATH Other; Body Fluid  Result Value Ref Range Status   Specimen Description   Final    WOUND Performed at [facility], [address] [place], [place] [address]    Special Requests   Final    EPIDURAL Performed at [facility], [address] [place], [place] [address]    Gram Stain   Final    ABUNDANT WBC PRESENT, PREDOMINANTLY PMN NO ORGANISMS SEEN    Culture   Final    NO GROWTH < 12 HOURS Performed at [facility] [address], [place], [place] [address]    Report Status PENDING  Incomplete  Aerobic/Anaerobic Culture w Gram Stain (surgical/deep wound)     Status: None (Preliminary result)   Collection Time: 06/17/20  5:55 PM   Specimen: PATH Other; Body Fluid  Result Value Ref Range  Status   Specimen Description   Final    WOUND Performed at [facility], [address] [place], [place] [address]    Special Requests   Final    THORACIC Performed at [facility], [address] [place], [place] [address]    Gram Stain   Final    RARE WBC PRESENT, PREDOMINANTLY PMN NO ORGANISMS SEEN    Culture   Final    NO GROWTH < 12 HOURS Performed at [facility] [address], [place], [place] [address]    Report Status PENDING  Incomplete     Scheduled Meds: . chlorhexidine  15 mL Mouth Rinse BID  . Chlorhexidine Gluconate Cloth  6 each Topical Q0600  . diltiazem  120 mg Oral Daily  . gabapentin  200 mg Oral BID  . hydrochlorothiazide  12.5 mg Oral Daily  . insulin aspart  0-15 Units Subcutaneous TID WC  . insulin aspart  0-5 Units Subcutaneous QHS  . insulin glargine  30 Units  Subcutaneous Daily  . levothyroxine  150 mcg Oral Q0600  . losartan  100 mg Oral Daily  . magnesium oxide  400 mg Oral Daily  . mouth rinse  15 mL Mouth Rinse q12n4p  . pantoprazole  40 mg Oral Daily  . potassium chloride  20 mEq Oral Daily  . sodium chloride flush  10-40 mL Intracatheter Q12H  . thiamine injection  100 mg Intravenous Daily  . traZODone  50 mg Oral QHS  . valACYclovir  1,000 mg Oral BID   Continuous Infusions: . sodium chloride Stopped (06/18/20 1437)  . cefTRIAXone (ROCEPHIN)  IV 2 g (06/19/20 0548)     LOS: 10 days   Cherene Altes, MD [facility] Office  [phone] Pager - Text Page per Amion  If 7PM-7AM, please contact night-coverage per Amion 06/19/2020, 8:55 AM

## 2020-06-19 NOTE — Progress Notes (Signed)
  Speech Language Pathology Treatment: Dysphagia  Patient Details Name: Tonya Myers MRN: [id number] DOB: 11/20/1965 Today's Date: 06/19/2020 Time: 3419-6222 SLP Time Calculation (min) (ACUTE ONLY): 30 min  Assessment / Plan / Recommendation Clinical Impression  Pt seen for ST treatment for dysphagia, for assessment of diet tolerance and readiness for advancement. S/p IR drainage of paraspinal abscess this morning, was placed on regular diet post-procedure. Patient lethargic but awakens to voice; responds to questions although with delayed processing, some confusion/confabulation. Positioned upright and assisted with items from regular tray, including thin liquids, puree, and solids which SLP modified to dysphagia 2 texture. Patient with intermittent loss of attention to bolus with solids, requiring moderate cues/redirection. Prolonged mastication, and mild residue with dysphagia 2 solids. Facilitated oral clearance with liquid wash. No overt signs of aspiration with any consistency, however due to fatigue, attention deficits, recommend dysphagia 1 (puree) with thin liquids, meds crushed, and full supervision/assist for feeding. SLP will continue to follow, with potential for advancement as arousal, attention improve.      HPI HPI: Tonya Myers is a 55 y.o. female admitted with aphasia out of proportion to encephalopathy, fever and tachycardia x 2 days. 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      SLP Plan  Continue with current plan of care       Recommendations  Diet recommendations: Dysphagia 1 (puree);Thin liquid Liquids provided via: Cup;Straw (single sips) Medication Administration: Crushed with puree Supervision: Staff to assist with self feeding Compensations: Minimize environmental distractions;Slow rate;Small sips/bites Postural Changes and/or Swallow Maneuvers: Seated upright 90 degrees;Upright 30-60 min after meal                Oral  Care Recommendations: Oral care before and after PO Follow up Recommendations: Inpatient Rehab;Skilled Nursing facility SLP Visit Diagnosis: Dysphagia, oropharyngeal phase (R13.12) Plan: Continue with current plan of care       [place], [place], CCC-SLP Speech-Language Pathologist  Aliene Altes 06/19/2020, 2:49 PM

## 2020-06-19 NOTE — Progress Notes (Signed)
    Attending Progress Note  History: Tonya Myers is here for epidural abscess.  POD2: No events overnight. Her legs continue to feel better than preop  POD1: Tonya Myers did well overnight.  She has increased movement and feeling in her legs.    Physical Exam: Vitals:   06/19/20 0351 06/19/20 0736  BP: 133/71 138/81  Pulse: 98 98  Resp: 18 18  Temp: 98.2 F (36.8 C) 99.1 F (37.3 C)  SpO2: (!) 89% 93%    AA Ox3 CNI  Strength:  UE diffusely 4-4+/5 except grip, which is 4- 5/5 throughout BLE except IP which is 4/5 bilaterally  Data:  Recent Labs  Lab 06/16/20 0502 06/17/20 0640 06/19/20 0605  NA 139 137 138  K 3.6 3.4* 3.6  CL 109 104 104  CO2 23 26 24   BUN 19 15 18   CREATININE 0.86 0.93 0.83  GLUCOSE 305* 276* 343*  CALCIUM 8.3* 8.3* 7.5*   Recent Labs  Lab 06/19/20 0605  AST 42*  ALT 37  ALKPHOS 207*     Recent Labs  Lab 06/16/20 0502 06/17/20 0640 06/19/20 0605  WBC 11.7* 11.0* 11.7*  HGB 8.7* 8.6* 8.0*  HCT 28.1* 27.1* 25.5*  PLT 255 257 385   No results for input(s): APTT, INR in the last 168 hours.       Other tests/results: cultures pending  Drains - 50 ml  Assessment/Plan:  Tonya Myers is doing well after multilevel laminectomy for washout of epidural abscesses.  She also has cervical stenosis and myelopathy, but this is clinically stable.  - continue drains. Will remove 1 drain later today. - Abx per ID. - pain control - DVT prophylaxis ok - PTOT - getting IR drainage today   Meade Maw MD, [facility] Department of Neurosurgery

## 2020-06-19 NOTE — Sedation Documentation (Signed)
Vital signs stable.Report called to Anda Kraft, RN of rm. 116. Pt. Stable for transport. Lower lumbar sites x2 clean, dry, intact with bandaids. No active drainage. Pt. Tolerated procedure well.

## 2020-06-19 NOTE — Procedures (Signed)
Interventional Radiology Procedure Note  Procedure: Image guided aspiration of left lumbar paraspinal fluid.  ~3-4 cc of material aspirated.  Culture sent.    Findings: Mostly myositis, with no large discrete pocket on [place].  Complications: None  EBL: None Sample: Culture sent  Recommendations: - Routine wound care.  Dry dressing changes as needed - follow up Cx - 4 hours to restart any blood thinners  Signed,  Dulcy Fanny. Earleen Newport, DO

## 2020-06-19 NOTE — Evaluation (Signed)
Physical Therapy Evaluation Patient Details Name: Tonya Myers MRN: [id number] DOB: [birth year] Today's Date: 06/19/2020   History of Present Illness  Pt is a 55 y/o female admitted from home on 06/09/20 after presenting with AMS. Pt being treated for acute encephalopathy. 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 Pt also found to have paraspinal abscess, small epidural abscess, septic arthritis L2-L3 and L3-L4 with Neurosurgery & IR recommended serial imaging. Now s/p T3-4, T5-7, T9-10 laminectomy and evacuation of abscess (06/17/20); pending IR drainage of paraspinal abscess later this date (06/19/20).  PMH: hypothyroidism, DDD, cervical radiculopathy, CLL, DM, HTN, HLD, anxiety, arthritis, CA, chronic back pain, asthma, depression, vertigo.  Clinical Impression  Patient sleeping upon arrival to room; daughter at bedside, very supportive and encouraging to patient.  Easily awakens to voice/light touch for participation with session.  Oriented to self, general situation; follows simple commands, but noted delay in processing, task initiation and intermittently motor perseverative throughout session.  Generalized weakness throughout all extremities; R UE/LE grossly 3 to 3+/5, L UE/LE grossly 2 to 2+/5.  Difficulty with isolated initiation/termination and isometric control of all extremities at times.  Does accurately detect light touch to all extremities and appropriately localizes light touch between R/L (with no extinction noted).  Currently requiring max assist for rolling (hand-over-hand assist to position UE/LE for active assist); max/total assist for supine/sit; min progressing to close sup for unsupported sitting balance once oriented to midline in both M/L, A/P planes.  Maintains forward head, rounded shoulder posturing; does demonstrate ability to withstand mild external resistance and perturbation (though easily distracted and balance deteriorates with divided attention).  Very  minimal/no functional reach outside immediate BOS.  Additional transfer attempts deferred at this time, as transport arriving for transfer to procedure.  Will continue to assess/progress as appropriate in subsequent sessions. Would benefit from skilled PT to address above deficits and promote optimal return to PLOF.; recommend transition to acute inpatient rehab upon discharge for high-intensity, post-acute rehab services.      Follow Up Recommendations [name]    Equipment Recommendations       Recommendations for Other Services       Precautions / Restrictions Precautions Precautions: Fall;Back Precaution Comments: thoracic hemovacs Restrictions Weight Bearing Restrictions: No      Mobility  Bed Mobility Overal bed mobility: Needs Assistance Bed Mobility: Rolling;Supine to Sit;Sit to Supine Rolling: Max assist   Supine to sit: Max assist Sit to supine: Max assist;Total assist   General bed mobility comments: hand-over-hand assist for position of UE/LEs with bed mobility efforts; does actively participate, but requires constant verbal cuing for attention to/redirection to task at hand.  Increased ability to assist with with R UE/LE vs L UE/LE    Transfers                 General transfer comment: deferred due to upcoming procedure, transport on the way  Ambulation/Gait             General Gait Details: unsafe/unable  Stairs            Wheelchair Mobility    Modified Rankin (Stroke Patients Only)       Balance Overall balance assessment: Needs assistance Sitting-balance support: No upper extremity supported;Feet supported Sitting balance-Leahy Scale: Fair Sitting balance - Comments: able to maintain static sitting with close sup once oriented to midline in both M/L, A/P planes; maintains forward head, rounded shoulder posturing; does demonstrate ability to withstand mild external resistance and perturbation (though  easily distracted and balance  deteriorates with divided attention).  Very minimal/no functional reach outside immediate BOS       Standing balance comment: unsafe/unable                             Pertinent Vitals/Pain Pain Assessment: Faces Faces Pain Scale: Hurts little more Pain Location: back Pain Descriptors / Indicators: Grimacing Pain Intervention(s): Limited activity within patient's tolerance;Monitored during session;Repositioned    Home Living Family/patient expects to be discharged to:: Private residence Living Arrangements: Spouse/significant other Available Help at Discharge: Family;Available 24 hours/day Type of Home: House Home Access: Level entry     Home Layout: Two level;Able to live on main level with bedroom/bathroom Home Equipment: Shower seat;Grab bars - toilet      Prior Function Level of Independence: Independent         Comments: Ind amb community distances without an AD, Ind with ADLs, two falls in the last year both from falling out of a poorly designed office chair that tipped when she would lean forward     Hand Dominance   Dominant Hand: Right    Extremity/Trunk Assessment   Upper Extremity Assessment Upper Extremity Assessment: RUE deficits/detail;LUE deficits/detail RUE Deficits / Details: grossly 3-/5 throughout shoulder, elbow, wrist and hand; limited ability to sustain activation; able to detect light touch and localize R/L LUE Deficits / Details: grossly 2+ to 3-/5 throughout shoulder, elbow, wrist and hand; limited ability to sustain activation; able to detect light touch and localize R/L    Lower Extremity Assessment Lower Extremity Assessment: RLE deficits/detail;LLE deficits/detail RLE Deficits / Details: grossly 3- to 3 throughout, able to activate against manual resistance for gross hip/knee extension at times; intermittent difficulty with initiation/termination of isolated movement (slightly motor peseverative at times); able to detect light  touch and accurately localize R/L LLE Deficits / Details: grossly 2+ to 3-/5 throughout, increased difficulty with motor planning and initiation/termination of isolated musculature; able to detect light touch and accurately localize R/L    Cervical / Trunk Assessment Cervical / Trunk Assessment:  (forward head, rounded shoulder posturing; limited ability to initiate extension)  Communication   Communication:  (slightly garbled at times)  Cognition Arousal/Alertness: Lethargic Behavior During Therapy: WFL for tasks assessed/performed Overall Cognitive Status: Impaired/Different from baseline                                 General Comments: Oriented to self, general situation; follows approx 50% simple commands, perseverative in motor planning at times.      General Comments      Exercises Other Exercises Other Exercises: Unsupported sitting balance, worked to facilitate postural extension, core activation/stabilization and balance reactions in M/L, A/P planes.  Close sup/min assist throughout.  Does tolerate mild perturbation/manual resistance with unsupported sitting, but performance deteriorates with divided attention (easily distracted).  Will plan to incorporate dynamic reaching, functional tasks next session and progress towards transfer efforts as appropriate.   Assessment/Plan    PT Assessment Patient needs continued PT services  PT Problem List Decreased strength;Decreased activity tolerance;Decreased balance;Decreased mobility;Decreased coordination;Decreased knowledge of use of DME;Decreased range of motion;Decreased cognition;Decreased safety awareness;Decreased knowledge of precautions;Cardiopulmonary status limiting activity;Pain       PT Treatment Interventions DME instruction;Gait training;Functional mobility training;Therapeutic activities;Therapeutic exercise;Balance training;Patient/family education    PT Goals (Current goals can be found in the Care  Plan section)  Acute Rehab PT Goals Patient Stated Goal: to get better PT Goal Formulation: With patient/family Time For Goal Achievement: 07/03/20 Potential to Achieve Goals: Good Additional Goals Additional Goal #1: Assess and establish goals for standing/gait as appropriate.    Frequency Min 2X/week   Barriers to discharge        Co-evaluation               AM-PAC PT "6 Clicks" Mobility  Outcome Measure Help needed turning from your back to your side while in a flat bed without using bedrails?: A Lot Help needed moving from lying on your back to sitting on the side of a flat bed without using bedrails?: Total Help needed moving to and from a bed to a chair (including a wheelchair)?: Total Help needed standing up from a chair using your arms (e.g., wheelchair or bedside chair)?: Total Help needed to walk in hospital room?: Total Help needed climbing 3-5 steps with a railing? : Total 6 Click Score: 7    End of Session   Activity Tolerance: Patient tolerated treatment well Patient left: in bed;with call bell/phone within reach;with bed alarm set Nurse Communication: Mobility status PT Visit Diagnosis: Unsteadiness on feet (R26.81);Difficulty in walking, not elsewhere classified (R26.2);Muscle weakness (generalized) (M62.81);Other symptoms and signs involving the nervous system (R29.898)    Time: 3912-2583 PT Time Calculation (min) (ACUTE ONLY): 27 min   Charges:   PT Evaluation $PT Re-evaluation: 1 Re-eval PT Treatments $Neuromuscular Re-education: 8-22 mins       Fiana Gladu H. Owens Shark, PT, DPT, NCS 06/19/20, 10:13 AM [phone]

## 2020-06-19 NOTE — Progress Notes (Signed)
OT Cancellation Note  Patient Details Name: Tonya Myers MRN: [id number] DOB: 03/25/1966   Cancelled Treatment:    Reason Eval/Treat Not Completed: Patient at procedure or test/ unavailable. Order received, chart reviewed. Pt currently at Interventional Radiology for procedure. Will see pt at a later time/date, as her availability and condition allows.   Josiah Lobo, PhD, [place], OTR/L ascom [phone] 06/19/20, 11:23 AM

## 2020-06-19 NOTE — Evaluation (Signed)
Occupational Therapy Evaluation Patient Details Name: Tonya Myers MRN: [id number] DOB: 03/11/1966 Today's Date: 06/19/2020    History of Present Illness Pt is a 55 y/o female admitted from home on 06/09/20 after presenting with AMS. Pt being treated for acute encephalopathy. 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 Pt also found to have paraspinal abscess, small epidural abscess, septic arthritis L2-L3 and L3-L4 with Neurosurgery & IR recommended serial imaging. Now s/p T3-4, T5-7, T9-10 laminectomy and evacuation of abscess (06/17/20); pending IR drainage of paraspinal abscess later this date (06/19/20).  PMH: hypothyroidism, DDD, cervical radiculopathy, CLL, DM, HTN, HLD, anxiety, arthritis, CA, chronic back pain, asthma, depression, vertigo.   Clinical Impression   Tonya Myers presents today with extreme fatigue, intermittently able to respond to questions and simple, one-step requests, interspersed with moments during which she closes her eyes and disengages. She exhibits significant weakness in all 4 extremities; inability to track visually; expressive difficulties, with delayed and occasionally incomprehensible speech; and overall delayed processing. Pt underwent interventional radiology procedure (aspiration of lumbar paraspinal fluid) ~ 4 hours prior to OT session; hence, pt's lethargy and low level of responsiveness likely in part secondary to fatigue from this procedure. Pt requires Max A for all bed-level mobility and was unable to safely come into seated position. Pt was able to communicate, however, that she hoped to be able to participate to a greater extent in therapy tomorrow. Given her previous IND status and supportive home environment, Tonya Myers will benefit from frequent OT services in this acute care setting, followed by [name] care post D/C, to support return to PLOF.    Follow Up Recommendations  [name]    Equipment Recommendations       Recommendations for  Other Services       Precautions / Restrictions Precautions Precautions: Fall;Back Precaution Comments: thoracic hemovacs Restrictions Weight Bearing Restrictions: No      Mobility Bed Mobility Overal bed mobility: Needs Assistance Bed Mobility: Rolling Rolling: Max assist         General bed mobility comments: Max A for rolling in bed; pt unable to engage in further bed mobility, 2/2 to fatigue, inattention    Transfers                 General transfer comment: did not attempt at this time    Balance       Sitting balance - Comments: Pt unable to come into sitting posture                                   ADL either performed or assessed with clinical judgement   ADL Overall ADL's : Needs assistance/impaired                                     Functional mobility during ADLs: Maximal assistance       Vision Patient Visual Report:  (unable to track with eyes, particularly pronounced on L side)       Perception     Praxis      Pertinent Vitals/Pain Pain Assessment: Faces Faces Pain Scale: Hurts little more Pain Location: back and neck. pt unable to respond to questioning re: pain scale Pain Descriptors / Indicators: Moaning;Grimacing;Guarding Pain Intervention(s): Limited activity within patient's tolerance     Hand Dominance Right   Extremity/Trunk Assessment Upper  Extremity Assessment Upper Extremity Assessment: Generalized weakness   Lower Extremity Assessment Lower Extremity Assessment: Generalized weakness       Communication Communication Communication: Expressive difficulties   Cognition Arousal/Alertness: Lethargic Behavior During Therapy: Flat affect Overall Cognitive Status: Impaired/Different from baseline                                 General Comments: Oriented to self, place, situation, unable to recall month or year   General Comments  Pt endorses dizziness with bed  mobility    Exercises     Shoulder Instructions      Home Living Family/patient expects to be discharged to:: Private residence Living Arrangements: Spouse/significant other Available Help at Discharge: Family;Available 24 hours/day Type of Home: House Home Access: Level entry     Home Layout: Two level;Able to live on main level with bedroom/bathroom Alternate Level Stairs-Number of Steps: 16   Bathroom Shower/Tub: Occupational psychologist: Standard     Home Equipment: Shower seat;Grab bars - toilet          Prior Functioning/Environment Level of Independence: Independent        Comments: Data on living situation and PLOF taken from chart review. Pt unable at this time to communicate this information.        OT Problem List: Decreased strength;Decreased activity tolerance;Impaired balance (sitting and/or standing);Decreased safety awareness;Decreased knowledge of precautions      OT Treatment/Interventions: Self-care/ADL training;Therapeutic exercise;Energy conservation;DME and/or AE instruction;Therapeutic activities;Patient/family education;Balance training    OT Goals(Current goals can be found in the care plan section) Acute Rehab OT Goals Patient Stated Goal: to get better OT Goal Formulation: With patient Time For Goal Achievement: 06/29/20 Potential to Achieve Goals: Good ADL Goals Pt Will Perform Grooming: with min guard assist;sitting Pt Will Perform Upper Body Bathing: with min guard assist;sitting Pt Will Perform Lower Body Dressing: with min assist;with adaptive equipment;sitting/lateral leans Pt Will Transfer to Toilet: with mod assist;stand pivot transfer (using LRAD) Pt Will Perform Toileting - Clothing Manipulation and hygiene: with min assist;sit to/from stand  OT Frequency: Min 3X/week   Barriers to D/C:            Co-evaluation              AM-PAC OT "6 Clicks" Daily Activity     Outcome Measure Help from another person  eating meals?: A Little Help from another person taking care of personal grooming?: A Lot Help from another person toileting, which includes using toliet, bedpan, or urinal?: A Lot Help from another person bathing (including washing, rinsing, drying)?: A Lot Help from another person to put on and taking off regular upper body clothing?: A Lot Help from another person to put on and taking off regular lower body clothing?: A Lot 6 Click Score: 13   End of Session Nurse Communication: Mobility status  Activity Tolerance: Treatment limited secondary to medical complications (Comment);Other (comment) (fatigue, inattention, deconditioning) Patient left: in bed;with call bell/phone within reach;with bed alarm set  OT Visit Diagnosis: Unsteadiness on feet (R26.81);Muscle weakness (generalized) (M62.81)                Time: 0938-1829 OT Time Calculation (min): 13 min Charges:  OT General Charges $OT Visit: 1 Visit OT Evaluation $OT Eval Moderate Complexity: 1 Mod  Josiah Lobo, PhD, MS, OTR/L ascom [phone] 06/19/20, 4:00 PM

## 2020-06-19 NOTE — Sedation Documentation (Signed)
Dr. Earleen Newport spoke with pt. Daughter extensively re: lumbar abcess extraction; Consent signed by daugter Domingo Sep for pt. Secondary to mild pt. Confusion.

## 2020-06-19 NOTE — Progress Notes (Signed)
Date of Admission:  06/09/2020     ID: Tonya Myers is a 55 y.o. female Principal Problem:   Acute encephalopathy Active Problems:   CLL (chronic lymphocytic leukemia) (HCC)   Hypothyroidism   Essential hypertension   Uncontrolled type 2 diabetes mellitus with hyperglycemia, without long-term current use of insulin (HCC)   Aphasia   Hyperglycemic crisis in diabetes mellitus ([place])   Acute metabolic encephalopathy   Severe sepsis (HCC)   Pneumococcal meningitis   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HCC)   Epidural abscess   Hypernatremia   Hypomagnesemia   Obesity (BMI 30.0-34.9)    Subjective: Patient underwent paraspinal abscess aspiration today about 3 cc of purulent fluid was taken.  She received fentanyl and 0.5 mg of Versed. She is little lethargic.  Medications:  . chlorhexidine  15 mL Mouth Rinse BID  . Chlorhexidine Gluconate Cloth  6 each Topical Q0600  . diltiazem  120 mg Oral Daily  . fentaNYL      . gabapentin  200 mg Oral BID  . hydrochlorothiazide  12.5 mg Oral Daily  . insulin aspart  0-15 Units Subcutaneous TID WC  . insulin aspart  0-5 Units Subcutaneous QHS  . insulin aspart  6 Units Subcutaneous TID WC  . insulin glargine  36 Units Subcutaneous Daily  . levothyroxine  150 mcg Oral Q0600  . losartan  100 mg Oral Daily  . magnesium oxide  400 mg Oral Daily  . mouth rinse  15 mL Mouth Rinse q12n4p  . midazolam      . pantoprazole  40 mg Oral Daily  . potassium chloride  20 mEq Oral Daily  . sodium chloride flush  10-40 mL Intracatheter Q12H  . thiamine injection  100 mg Intravenous Daily  . traZODone  50 mg Oral QHS  . valACYclovir  1,000 mg Oral BID    Objective: Vital signs in last 24 hours: Temp:  [97.6 F (36.4 C)-99.1 F (37.3 C)] 99.1 F (37.3 C) (02/24 0736) Pulse Rate:  [80-107] 80 (02/24 1125) Resp:  [16-20] 20 (02/24 1125) BP: (121-138)/(67-94) 132/86 (02/24 1125) SpO2:  [88 %-100 %] 100 % (02/24 1125) Weight:  [104.3 kg] 104.3 kg (02/24  0500)  PHYSICAL EXAM:  General: Awake alert less alert little lethargic Responds to questions slowly. Speech appears to be little slurred head: Normocephalic, without obvious abnormality, atraumatic. Eyes: Conjunctivae clear, anicteric sclerae. Pupils are equal  Lungs: Bilateral air entry Heart: S1-S2 Abdomen: Soft, non-tender,not distended. Bowel sounds normal. No masses Extremities: Edema of the extremities skin: No rashes or lesions. Or bruising Lymph: Cervical, supraclavicular normal. Neurologic: Moves all limbs spontaneously.  I did not assess the power.   2 drains present. Lab Results Recent Labs    06/17/20 0640 06/19/20 0605  WBC 11.0* 11.7*  HGB 8.6* 8.0*  HCT 27.1* 25.5*  NA 137 138  K 3.4* 3.6  CL 104 104  CO2 26 24  BUN 15 18  CREATININE 0.93 0.83   Liver Panel Recent Labs    06/19/20 0605  PROT 5.7*  ALBUMIN 1.9*  AST 42*  ALT 37  ALKPHOS 207*  BILITOT 0.6   Sedimentation Rate No results for input(s): ESRSEDRATE in the last 72 hours. C-Reactive Protein No results for input(s): CRP in the last 72 hours.  Microbiology:  Studies/Results: DG Thoracic Spine 2 View  Result Date: 06/18/2020 CLINICAL DATA:  Bilateral thoracic laminectomy for epidural abscess EXAM: THORACIC SPINE 2 VIEWS; DG C-ARM 1-60 MIN-NO REPORT COMPARISON:  MR 06/17/2020 FINDINGS:  Intraoperative fluoroscopy was obtained during the process of a thoracic laminectomy for evacuation of an epidural abscess. Image #5 demonstrates a blunt surgical implement projects over the T4 vertebral level on the frontal radiograph. Surgical retractors seen posteriorly. Images labeled as #3,4 are more difficult to localize given the absence of localizing landmarks. Recommend correlation with operative report. IMPRESSION: Intraoperative fluoroscopy obtained during the process of a thoracic laminectomy for evacuation of an epidural abscess. Electronically Signed   By: Lovena Le M.D.   On: 06/18/2020 01:01    MR CERVICAL SPINE WO CONTRAST  Result Date: 06/17/2020 CLINICAL DATA:  Septic arthritis EXAM: MRI CERVICAL AND THORACIC SPINE WITHOUT CONTRAST TECHNIQUE: Multiplanar and multiecho pulse sequences of the cervical spine, to include the craniocervical junction and cervicothoracic junction, and the thoracic spine, were obtained without intravenous contrast. COMPARISON:  MRI June 11, 2020. FINDINGS: MRI CERVICAL SPINE FINDINGS Motion limited examination. Alignment: Normal. Vertebrae: Vertebral body heights are maintained. No focal marrow signal or disc signal abnormality to suggest discitis/osteomyelitis. Canal/Cord: Significantly limited evaluation given motion without definite cord signal abnormality or canal fluid collection. Posterior Fossa, vertebral arteries, paraspinal tissues: There is edema in the posterior paraspinal soft tissues in the upper cervical spine. No evidence of discrete fluid collection. Disc levels: C2-C3: Posterior disc osteophyte complex with mass effect on the ventral thecal sac. No significant foraminal stenosis. C3-C4: Left eccentric posterior disc osteophyte complex with mass effect on the thecal sac and deformity of the ventral cord with at least moderate canal stenosis. Bilateral facet and uncovertebral hypertrophy. Evaluation of the foramina is limited with possibly moderate bilateral foraminal stenosis C4-C5: Posterior disc osteophyte complex with mass effect on the thecal sac and suspected moderate canal stenosis. Bilateral facet and uncovertebral hypertrophy. Evaluation of foramina is limited with suspected moderate bilateral foraminal stenosis. C5-C6: Posterior disc osteophyte complex with disc contacting the ventral cord and suspected moderate canal stenosis. Evaluation of the foramina is limited with possibly severe bilateral foraminal stenosis C6-C7: Posterior disc osteophyte complex with bilateral facet and uncovertebral hypertrophy. C7-T1: No significant disc protrusion,  foraminal stenosis, or canal stenosis. MRI THORACIC SPINE FINDINGS Alignment:  Normal. Vertebrae: Vertebral body heights are maintained. No focal marrow edema to suggest osteomyelitis. Canal/Cord: There is a large fluid collection within the dorsal epidural space spanning the entire thoracic spine (C7-T1 to T12-L1), measuring up to 1.0 x 1.5 cm at T7-T8. The collection appears increased in size to the prior with increased effacement of CSF around the cord. The epidural fluid collection along with a posterior disc osteophyte at T6-T7 results in severe canal stenosis at this level with near complete effacement of CSF. There is mild canal stenosis at T2-T3, T3-T4, T10-T11 and T11-T12 and moderate canal stenosis at T4-T5, T5-T6, T7-T8, T8-T9, T9-T10. No evidence of abnormal cord signal. Paraspinal and other soft tissues: Edema in the posterior paraspinal soft tissues in the upper thoracic spine and lower thoracic spine. Nonspecific T2 hyperintense hepatic lesion, not well characterized on this study. Bibasilar lung opacities, better characterized on recent chest radiograph. Disc levels: Canal stenosis is detailed above. Suspected moderate left foraminal stenosis at T8-T9. IMPRESSION: Motion limited study. 1. Large dorsal epidural fluid collection spanning the entirety of the thoracic spine, increased in size relative to February 16 MRI. The collection along with degenerative change results in severe canal stenosis at T6-T7 with near complete effacement of CSF and multilevel moderate canal stenosis in the thoracic spine, progressed since the prior and detailed above. While nonspecific by imaging, findings are  concerning for epidural abscess given the patient's clinical history. Postcontrast imaging could provide more complete evaluation if clinically indicated. 2. Edema within the paraspinal soft tissues in the upper cervical and thoracic spine and lower thoracic spine, concerning for infection/cellulitis given the  clinical history. No discrete drainable fluid collection identified in the paraspinal soft tissues of the cervical or thoracic spine identified, although evaluation is limited by motion and absence of IV contrast. Please see separately dictated lumbar spine for infectious findings in the lumbar spine. 3. Evaluation of degenerative change in the cervical spine is significantly limited given motion. Multilevel suspected at least moderate canal stenosis, worst on the left at C3-C4 where posterior disc osteophyte contacts and deforms the left cord with effacement of the left root entry zone. Evaluation of the foramina is particularly limited, but there is suspected at least moderate multilevel foraminal stenosis in the cervical spine and on the left at T8-T9. Repeat MRI (possibly with sedation) could further characterize if clinically indicated. Critical findings discussed with Dr. Cheral Marker via telephone at 1:09 PM. Electronically Signed   By: Margaretha Sheffield MD   On: 06/17/2020 13:22   MR THORACIC SPINE WO CONTRAST  Result Date: 06/17/2020 CLINICAL DATA:  Septic arthritis EXAM: MRI CERVICAL AND THORACIC SPINE WITHOUT CONTRAST TECHNIQUE: Multiplanar and multiecho pulse sequences of the cervical spine, to include the craniocervical junction and cervicothoracic junction, and the thoracic spine, were obtained without intravenous contrast. COMPARISON:  MRI June 11, 2020. FINDINGS: MRI CERVICAL SPINE FINDINGS Motion limited examination. Alignment: Normal. Vertebrae: Vertebral body heights are maintained. No focal marrow signal or disc signal abnormality to suggest discitis/osteomyelitis. Canal/Cord: Significantly limited evaluation given motion without definite cord signal abnormality or canal fluid collection. Posterior Fossa, vertebral arteries, paraspinal tissues: There is edema in the posterior paraspinal soft tissues in the upper cervical spine. No evidence of discrete fluid collection. Disc levels: C2-C3:  Posterior disc osteophyte complex with mass effect on the ventral thecal sac. No significant foraminal stenosis. C3-C4: Left eccentric posterior disc osteophyte complex with mass effect on the thecal sac and deformity of the ventral cord with at least moderate canal stenosis. Bilateral facet and uncovertebral hypertrophy. Evaluation of the foramina is limited with possibly moderate bilateral foraminal stenosis C4-C5: Posterior disc osteophyte complex with mass effect on the thecal sac and suspected moderate canal stenosis. Bilateral facet and uncovertebral hypertrophy. Evaluation of foramina is limited with suspected moderate bilateral foraminal stenosis. C5-C6: Posterior disc osteophyte complex with disc contacting the ventral cord and suspected moderate canal stenosis. Evaluation of the foramina is limited with possibly severe bilateral foraminal stenosis C6-C7: Posterior disc osteophyte complex with bilateral facet and uncovertebral hypertrophy. C7-T1: No significant disc protrusion, foraminal stenosis, or canal stenosis. MRI THORACIC SPINE FINDINGS Alignment:  Normal. Vertebrae: Vertebral body heights are maintained. No focal marrow edema to suggest osteomyelitis. Canal/Cord: There is a large fluid collection within the dorsal epidural space spanning the entire thoracic spine (C7-T1 to T12-L1), measuring up to 1.0 x 1.5 cm at T7-T8. The collection appears increased in size to the prior with increased effacement of CSF around the cord. The epidural fluid collection along with a posterior disc osteophyte at T6-T7 results in severe canal stenosis at this level with near complete effacement of CSF. There is mild canal stenosis at T2-T3, T3-T4, T10-T11 and T11-T12 and moderate canal stenosis at T4-T5, T5-T6, T7-T8, T8-T9, T9-T10. No evidence of abnormal cord signal. Paraspinal and other soft tissues: Edema in the posterior paraspinal soft tissues in the upper thoracic  spine and lower thoracic spine. Nonspecific T2  hyperintense hepatic lesion, not well characterized on this study. Bibasilar lung opacities, better characterized on recent chest radiograph. Disc levels: Canal stenosis is detailed above. Suspected moderate left foraminal stenosis at T8-T9. IMPRESSION: Motion limited study. 1. Large dorsal epidural fluid collection spanning the entirety of the thoracic spine, increased in size relative to February 16 MRI. The collection along with degenerative change results in severe canal stenosis at T6-T7 with near complete effacement of CSF and multilevel moderate canal stenosis in the thoracic spine, progressed since the prior and detailed above. While nonspecific by imaging, findings are concerning for epidural abscess given the patient's clinical history. Postcontrast imaging could provide more complete evaluation if clinically indicated. 2. Edema within the paraspinal soft tissues in the upper cervical and thoracic spine and lower thoracic spine, concerning for infection/cellulitis given the clinical history. No discrete drainable fluid collection identified in the paraspinal soft tissues of the cervical or thoracic spine identified, although evaluation is limited by motion and absence of IV contrast. Please see separately dictated lumbar spine for infectious findings in the lumbar spine. 3. Evaluation of degenerative change in the cervical spine is significantly limited given motion. Multilevel suspected at least moderate canal stenosis, worst on the left at C3-C4 where posterior disc osteophyte contacts and deforms the left cord with effacement of the left root entry zone. Evaluation of the foramina is particularly limited, but there is suspected at least moderate multilevel foraminal stenosis in the cervical spine and on the left at T8-T9. Repeat MRI (possibly with sedation) could further characterize if clinically indicated. Critical findings discussed with Dr. Cheral Marker via telephone at 1:09 PM. Electronically Signed    By: Margaretha Sheffield MD   On: 06/17/2020 13:22   DG C-Arm 1-60 Min-No Report  Result Date: 06/18/2020 CLINICAL DATA:  Bilateral thoracic laminectomy for epidural abscess EXAM: THORACIC SPINE 2 VIEWS; DG C-ARM 1-60 MIN-NO REPORT COMPARISON:  MR 06/17/2020 FINDINGS: Intraoperative fluoroscopy was obtained during the process of a thoracic laminectomy for evacuation of an epidural abscess. Image #5 demonstrates a blunt surgical implement projects over the T4 vertebral level on the frontal radiograph. Surgical retractors seen posteriorly. Images labeled as #3,4 are more difficult to localize given the absence of localizing landmarks. Recommend correlation with operative report. IMPRESSION: Intraoperative fluoroscopy obtained during the process of a thoracic laminectomy for evacuation of an epidural abscess. Electronically Signed   By: Lovena Le M.D.   On: 06/18/2020 01:01     Assessment/Plan:  Strep pneumo meningitis Strep pneumo bacteremia Extensive thoracic epidural abscess.  Underwent laminectomies of T3-T4, T5 -T7, T9-T10 and evacuation of epidural abscess.  Had 3 drains placed.  One drain has been removed today  Paraspinal abscess left underwent aspiration by IR 3cc drained today Patient currently on ceftriaxone 2 g IV every 12.  Will need for at least 6 weeks.  AKI resolved  Diabetes mellitus.  Was in DKA on presentation.  Now on Lantus  Hypothyroidism on Synthroid  CLL in remission Discussed the management with the care team.

## 2020-06-19 NOTE — TOC Progression Note (Signed)
Transition of Care [facility]) - Progression Note    Patient Details  Name: Jaycey Gens MRN: [id number] Date of Birth: 08-04-1965  Transition of Care [facility]) [name] Contact  Shelbie Hutching, RN Phone Number: 06/19/2020, 1:42 PM  Clinical Narrative:    Patient went for lumbar perispinal abscess drainage today in IR.  Patient has not been able to work with PT and OT due to procedures and alertness of patient.  TOC will cont to follow.     Expected Discharge Plan: IP Rehab Facility Barriers to Discharge: Continued Medical Work up  Expected Discharge Plan and Services Expected Discharge Plan: [place]   Discharge Planning Services: CM Consult Post Acute Care Choice: IP Rehab                                         Social Determinants of Health (SDOH) Interventions    Readmission Risk Interventions No flowsheet data found.

## 2020-06-20 DIAGNOSIS — R7881 Bacteremia: Secondary | ICD-10-CM | POA: Diagnosis not present

## 2020-06-20 DIAGNOSIS — G062 Extradural and subdural abscess, unspecified: Secondary | ICD-10-CM | POA: Diagnosis not present

## 2020-06-20 DIAGNOSIS — B953 Streptococcus pneumoniae as the cause of diseases classified elsewhere: Secondary | ICD-10-CM | POA: Diagnosis not present

## 2020-06-20 DIAGNOSIS — G009 Bacterial meningitis, unspecified: Secondary | ICD-10-CM | POA: Diagnosis not present

## 2020-06-20 DIAGNOSIS — M4620 Osteomyelitis of vertebra, site unspecified: Secondary | ICD-10-CM | POA: Diagnosis not present

## 2020-06-20 DIAGNOSIS — Z794 Long term (current) use of insulin: Secondary | ICD-10-CM

## 2020-06-20 LAB — IRON AND TIBC
Iron: 15 ug/dL — ABNORMAL LOW (ref 28–170)
Saturation Ratios: 8 % — ABNORMAL LOW (ref 10.4–31.8)
TIBC: 188 ug/dL — ABNORMAL LOW (ref 250–450)
UIBC: 173 ug/dL

## 2020-06-20 LAB — GLUCOSE, CAPILLARY
Glucose-Capillary: 137 mg/dL — ABNORMAL HIGH (ref 70–99)
Glucose-Capillary: 349 mg/dL — ABNORMAL HIGH (ref 70–99)
Glucose-Capillary: 206 mg/dL — ABNORMAL HIGH (ref 70–99)
Glucose-Capillary: 103 mg/dL — ABNORMAL HIGH (ref 70–99)

## 2020-06-20 LAB — COMPREHENSIVE METABOLIC PANEL
ALT: 57 U/L — ABNORMAL HIGH (ref 0–44)
AST: 91 U/L — ABNORMAL HIGH (ref 15–41)
Albumin: 2 g/dL — ABNORMAL LOW (ref 3.5–5.0)
Alkaline Phosphatase: 215 U/L — ABNORMAL HIGH (ref 38–126)
Anion gap: 7 (ref 5–15)
BUN: 18 mg/dL (ref 6–20)
CO2: 27 mmol/L (ref 22–32)
Calcium: 8.2 mg/dL — ABNORMAL LOW (ref 8.9–10.3)
Chloride: 107 mmol/L (ref 98–111)
Creatinine, Ser: 0.91 mg/dL (ref 0.44–1.00)
GFR, Estimated: >60 mL/min (ref >60)
Glucose, Bld: 153 mg/dL — ABNORMAL HIGH (ref 70–99)
Potassium: 3.6 mmol/L (ref 3.5–5.1)
Sodium: 141 mmol/L (ref 135–145)
Total Bilirubin: 0.7 mg/dL (ref 0.3–1.2)
Total Protein: 5.9 g/dL — ABNORMAL LOW (ref 6.5–8.1)

## 2020-06-20 LAB — FOLATE: Folate: 14.3 ng/mL (ref >5.9)

## 2020-06-20 LAB — CBC
HCT: 27.1 % — ABNORMAL LOW (ref 36.0–46.0)
Hemoglobin: 8.3 g/dL — ABNORMAL LOW (ref 12.0–15.0)
MCH: 25.4 pg — ABNORMAL LOW (ref 26.0–34.0)
MCHC: 30.6 g/dL (ref 30.0–36.0)
MCV: 82.9 fL (ref 80.0–100.0)
Platelets: 436 10*3/uL — ABNORMAL HIGH (ref 150–400)
RBC: 3.27 MIL/uL — ABNORMAL LOW (ref 3.87–5.11)
RDW: 17.1 % — ABNORMAL HIGH (ref 11.5–15.5)
WBC: 12.1 10*3/uL — ABNORMAL HIGH (ref 4.0–10.5)
nRBC: 0 % (ref 0.0–0.2)

## 2020-06-20 LAB — AMMONIA: Ammonia: <9 umol/L — ABNORMAL LOW (ref 9–35)

## 2020-06-20 LAB — RETICULOCYTES
Immature Retic Fract: 21.7 % — ABNORMAL HIGH (ref 2.3–15.9)
RBC.: 3.31 MIL/uL — ABNORMAL LOW (ref 3.87–5.11)
Retic Count, Absolute: 66.2 10*3/uL (ref 19.0–186.0)
Retic Ct Pct: 2 % (ref 0.4–3.1)

## 2020-06-20 LAB — FERRITIN: Ferritin: 803 ng/mL — ABNORMAL HIGH (ref 11–307)

## 2020-06-20 LAB — VITAMIN B12: Vitamin B-12: 1396 pg/mL — ABNORMAL HIGH (ref 180–914)

## 2020-06-20 MED ORDER — THIAMINE HCL 100 MG PO TABS
100.0000 mg | ORAL_TABLET | Freq: Every day | ORAL | Status: DC
  Administered 2020-06-20 – 2020-06-27 (×8): 100 mg via ORAL
  Filled 2020-06-20 (×8): qty 1

## 2020-06-20 MED ORDER — ENOXAPARIN SODIUM 60 MG/0.6ML ~~LOC~~ SOLN
0.5000 mg/kg | SUBCUTANEOUS | Status: DC
  Administered 2020-06-20 – 2020-06-27 (×8): 52.5 mg via SUBCUTANEOUS
  Filled 2020-06-20 (×8): qty 0.6

## 2020-06-20 NOTE — Progress Notes (Signed)
Tonya Myers  UTM:[id number] DOB: April 02, 1966 DOA: 06/09/2020 PCP: [name], MD    Brief Narrative:  55 year old with a history of DM, CLL, and chronic back pain who presented to [facility] with acute onset of AMS and the inability to talk.  MRI of the brain was negative for stroke.  She was found to be in DKA and also suffering with acute kidney injury.  During the course of her hospital stay she became febrile.  A full evaluation ultimately revealed the diagnosis of pneumococcal sepsis and pneumococcal meningitis as confirmed with blood cultures and lumbar puncture.  MRI of the spine revealed a paraspinal abscess as well as an epidural abscess.  Neurosurgery and IR have been consulted and are participating in her care.  Worsening of the patient's peripheral neurologic symptoms led to a repeat MRI of the lumbar spine 2/21 which confirmed an enlarging paraspinal abscess.  Repeat MRI of the cervical and thoracic spine 2/22 noted an enlarging dorsal epidural fluid collection.  Given these findings the patient was taken to the OR by neurosurgery 2/22.  Antimicrobials:  Ceftriaxone 2/15 > Cefepime 2/15 Vancomycin 2/15 > 2/17  DVT prophylaxis: Lovenox   Subjective: Afebrile.  Vital signs stable.  Underwent aspiration of her paraspinal lumbar abscess in IR yesterday. Much more sedate today w/ waxing/waning mental status.   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 Patient failed conservative antibiotic therapy - Neurosurgery took the patient to the OR 2/22 for a T3/4 laminectomy, T5-7 laminectomy, T9/10 laminectomy and evacuation of abscess - aspiration of lumbar paraspinal abscess completed by IR 2/24 - abx per ID continue, w/ plan for 6 weeks total tx   Fluctuating mental status - Acute metabolic encephalopathy Neurologically improving otherwise - no other sx to suggest decompensation - follow closely - avoid sedating meds as able    Acute kidney injury Creatinine 1.56 at presentation - resolved w/ supportive care  Severely uncontrolled DM2 with DKA at presentation CBG improved today - follow trend w/o change   Anemia Likely due primarily to severe smoldering infection (anemia panel c/w this) - follow trend - no indication for transfusion at this time  Mild transaminitis Unclear etiology -follow trend  Essential HTN Blood pressure controlled at this time  Obesity - Body mass index is 31.19 kg/m.  CLL  Hyponatremia Resolved  Hypomagnesemia Resolved  Disposition  [name] eval for possible admission    Code Status: FULL CODE Family Communication:  Status is: Inpatient  Remains inpatient appropriate because:Inpatient level of care appropriate due to severity of illness   Dispo: The patient is from: Home              Anticipated d/c is to: unclear              Anticipated d/c date is: > 3 days              Patient currently is not medically stable to d/c.   Difficult to place patient No   Consultants:  Neurosurgery ID Neurology IR  Objective: Blood pressure 128/71, pulse 99, temperature 98.6 F (37 C), temperature source Oral, resp. rate 16, height 6' (1.829 m), weight 104.3 kg, SpO2 (!) 88 %.  Intake/Output Summary (Last 24 hours) at 06/20/2020 0911 Last data filed at 06/19/2020 2329 Gross per 24 hour  Intake --  Output 600 ml  Net -600 ml   Filed Weights   06/17/20 1621 06/18/20 0500 06/19/20 0500  Weight: 109.1 kg 104.6  kg 104.3 kg    Examination: General: No acute respiratory distress - more sedate today  Lungs: Clear to auscultation bilaterally w/o wheeze Cardiovascular: RRR  Abdomen: NT/ND, soft, BS positive, no rebound Extremities: No C/C/E bilateral LE  CBC: Recent Labs  Lab 06/13/20 1702 06/14/20 0446 06/17/20 0640 06/19/20 0605 06/20/20 0600  WBC 11.4*   < > 11.0* 11.7* 12.1*  NEUTROABS 8.2*  --   --   --   --   HGB 7.1*   < > 8.6* 8.0* 8.3*  HCT 21.7*   < >  27.1* 25.5* 27.1*  MCV 80.4   < > 81.1 82.0 82.9  PLT 181   < > 257 385 436*   < > = values in this interval not displayed.   Basic Metabolic Panel: Recent Labs  Lab 06/16/20 0502 06/17/20 0640 06/18/20 0615 06/19/20 0605 06/20/20 0600  NA 139 137  --  138 141  K 3.6 3.4*  --  3.6 3.6  CL 109 104  --  104 107  CO2 23 26  --  24 27  GLUCOSE 305* 276*  --  343* 153*  BUN 19 15  --  18 18  CREATININE 0.86 0.93  --  0.83 0.91  CALCIUM 8.3* 8.3*  --  7.5* 8.2*  MG 1.8 1.7 1.8 1.7  --   PHOS 2.8 2.7 3.3  --   --    GFR: Estimated Creatinine Clearance: 93.3 mL/min (by C-G formula based on SCr of 0.91 mg/dL).  Liver Function Tests: Recent Labs  Lab 06/19/20 0605 06/20/20 0600  AST 42* 91*  ALT 37 57*  ALKPHOS 207* 215*  BILITOT 0.6 0.7  PROT 5.7* 5.9*  ALBUMIN 1.9* 2.0*    HbA1C: Hgb A1c MFr Bld  Date/Time Value Ref Range Status  06/09/2020 08:37 PM 8.0 (H) 4.8 - 5.6 % Final    Comment:    (NOTE)         Prediabetes: 5.7 - 6.4         Diabetes: >6.4         Glycemic control for adults with diabetes: <7.0     CBG: Recent Labs  Lab 06/19/20 0747 06/19/20 1145 06/19/20 1709 06/19/20 2008 06/20/20 0731  GLUCAP 345* 293* 248* 222* 137*    Recent Results (from the past 240 hour(s))  CULTURE, BLOOD (ROUTINE X 2) w Reflex to ID Panel     Status: Abnormal   Collection Time: 06/10/20  9:40 AM   Specimen: BLOOD  Result Value Ref Range Status   Specimen Description   Final    BLOOD BLOOD LEFT FOREARM Performed at [facility], [address], [place], [place] [address]    Special Requests   Final    BOTTLES DRAWN AEROBIC AND ANAEROBIC Blood Culture adequate volume Performed at [facility], [address] [place], [place] [address]    Culture  Setup Time   Final    Organism ID to follow IN [place] TO, READ BACK BY AND VERIFIED WITH: [place] @1941  ON 06/10/20  SKL Performed at [facility], [address] [place], [place] [address]    Culture STREPTOCOCCUS PNEUMONIAE (A)  Final   Report Status 06/13/2020 FINAL  Final   Organism ID, Bacteria STREPTOCOCCUS PNEUMONIAE  Final      Susceptibility   Streptococcus pneumoniae - MIC*    ERYTHROMYCIN >=8 RESISTANT Resistant     LEVOFLOXACIN  0.5 SENSITIVE Sensitive     VANCOMYCIN 0.5 SENSITIVE Sensitive     PENICILLIN (meningitis) <=0.06 SENSITIVE Sensitive     PENO - penicillin <=0.06      PENICILLIN (non-meningitis) <=0.06 SENSITIVE Sensitive     PENICILLIN (oral) <=0.06 SENSITIVE Sensitive     CEFTRIAXONE (non-meningitis) <=0.12 SENSITIVE Sensitive     CEFTRIAXONE (meningitis) <=0.12 SENSITIVE Sensitive     * STREPTOCOCCUS PNEUMONIAE  Blood Culture ID Panel (Reflexed)     Status: Abnormal   Collection Time: 06/10/20  9:40 AM  Result Value Ref Range Status   Enterococcus faecalis NOT DETECTED NOT DETECTED Final   Enterococcus Faecium NOT DETECTED NOT DETECTED Final   Listeria monocytogenes NOT DETECTED NOT DETECTED Final   Staphylococcus species NOT DETECTED NOT DETECTED Final   Staphylococcus aureus (BCID) NOT DETECTED NOT DETECTED Final   Staphylococcus epidermidis NOT DETECTED NOT DETECTED Final   Staphylococcus lugdunensis NOT DETECTED NOT DETECTED Final   Streptococcus species DETECTED (A) NOT DETECTED Final    Comment: CRITICAL RESULT CALLED TO, READ BACK BY AND VERIFIED WITH: SUSAN WATSON @1941  ON 06/10/20 SKL    Streptococcus agalactiae NOT DETECTED NOT DETECTED Final   Streptococcus pneumoniae DETECTED (A) NOT DETECTED Final    Comment: CRITICAL RESULT CALLED TO, READ BACK BY AND VERIFIED WITH: SUSAN WATSON @1941  ON 06/10/20 SKL    Streptococcus pyogenes NOT DETECTED NOT DETECTED Final   A.calcoaceticus-baumannii NOT DETECTED NOT DETECTED Final   Bacteroides fragilis NOT DETECTED NOT DETECTED Final   Enterobacterales NOT DETECTED NOT DETECTED Final   Enterobacter cloacae  complex NOT DETECTED NOT DETECTED Final   Escherichia coli NOT DETECTED NOT DETECTED Final   Klebsiella aerogenes NOT DETECTED NOT DETECTED Final   Klebsiella oxytoca NOT DETECTED NOT DETECTED Final   Klebsiella pneumoniae NOT DETECTED NOT DETECTED Final   Proteus species NOT DETECTED NOT DETECTED Final   Salmonella species NOT DETECTED NOT DETECTED Final   Serratia marcescens NOT DETECTED NOT DETECTED Final   Haemophilus influenzae NOT DETECTED NOT DETECTED Final   Neisseria meningitidis NOT DETECTED NOT DETECTED Final   Pseudomonas aeruginosa NOT DETECTED NOT DETECTED Final   Stenotrophomonas maltophilia NOT DETECTED NOT DETECTED Final   Candida albicans NOT DETECTED NOT DETECTED Final   Candida auris NOT DETECTED NOT DETECTED Final   Candida glabrata NOT DETECTED NOT DETECTED Final   Candida krusei NOT DETECTED NOT DETECTED Final   Candida parapsilosis NOT DETECTED NOT DETECTED Final   Candida tropicalis NOT DETECTED NOT DETECTED Final   Cryptococcus neoformans/gattii NOT DETECTED NOT DETECTED Final    Comment: Performed at [facility], [address] [place], [place] [address]  CULTURE, BLOOD (ROUTINE X 2) w Reflex to ID Panel     Status: None   Collection Time: 06/10/20 11:26 AM   Specimen: BLOOD  Result Value Ref Range Status   Specimen Description BLOOD LEFT ARM  Final   Special Requests   Final    BOTTLES DRAWN AEROBIC ONLY Blood Culture adequate volume   Culture   Final    NO GROWTH 5 DAYS Performed at [facility], [address] [place], [place] [address]    Report Status 06/15/2020 FINAL  Final  CSF culture w Stat Gram Stain     Status: None   Collection Time: 06/10/20  2:19 PM   Specimen: CSF; Cerebrospinal Fluid  Result Value Ref Range Status   Specimen Description   Final    CSF Performed at [facility], [address] [place], [place] [address]    Special Requests   Final    NONE Performed at [facility], [place], [place] [address]    Gram Stain   Final    GRAM POSITIVE DIPLOCOCCI CRITICAL RESULT CALLED TO, READ BACK BY AND VERIFIED WITH: KORY ANDREWS @1627  06/10/20 MJU WBC SEEN RBC SEEN Performed at [facility], [address] [place], [place] [address]    Culture MODERATE STREPTOCOCCUS PNEUMONIAE  Final   Report Status 06/12/2020 FINAL  Final   Organism ID, Bacteria STREPTOCOCCUS PNEUMONIAE  Final      Susceptibility   Streptococcus pneumoniae - MIC*    ERYTHROMYCIN >=8 RESISTANT Resistant     LEVOFLOXACIN 1 SENSITIVE Sensitive     VANCOMYCIN 0.25 SENSITIVE Sensitive     PENICILLIN (meningitis) <=0.06 SENSITIVE Sensitive     PENO - penicillin <=0.06      PENICILLIN (non-meningitis) <=0.06 SENSITIVE Sensitive     PENICILLIN (oral) <=0.06 SENSITIVE Sensitive     CEFTRIAXONE (non-meningitis) <=0.12 SENSITIVE Sensitive     CEFTRIAXONE (meningitis) <=0.12 SENSITIVE Sensitive     * MODERATE STREPTOCOCCUS PNEUMONIAE  MRSA PCR Screening     Status: None   Collection Time: 06/11/20  4:56 AM   Specimen: Nasopharyngeal  Result Value Ref Range Status   MRSA by PCR NEGATIVE NEGATIVE Final    Comment:        The GeneXpert MRSA Assay (FDA approved for NASAL specimens only), is one component of a comprehensive MRSA colonization surveillance program. It is not intended to diagnose MRSA infection nor to guide or monitor treatment for MRSA infections. Performed at [facility], [address] [place], [place] [address]   Culture, blood (Routine X 2) w Reflex to ID Panel     Status: None   Collection Time: 06/12/20 12:41 AM   Specimen: BLOOD  Result Value Ref Range Status   Specimen Description BLOOD BLOOD RIGHT HAND  Final   Special Requests   Final    BOTTLES DRAWN AEROBIC AND ANAEROBIC Blood Culture adequate volume   Culture   Final    NO GROWTH 5 DAYS Performed at [facility], [address], [place], [place] [address]    Report Status  06/17/2020 FINAL  Final  Culture, blood (Routine X 2) w Reflex to ID Panel     Status: None   Collection Time: 06/12/20 12:41 AM   Specimen: BLOOD  Result Value Ref Range Status   Specimen Description BLOOD BLOOD LEFT HAND  Final   Special Requests IN PEDIATRIC BOTTLE Blood Culture adequate volume  Final   Culture   Final    NO GROWTH 5 DAYS Performed at [facility], [address] [place], [place] [address]    Report Status 06/17/2020 FINAL  Final  Aerobic/Anaerobic Culture w Gram Stain (surgical/deep wound)     Status: None (Preliminary result)   Collection Time: 06/17/20  5:52 PM   Specimen: PATH Other; Body Fluid  Result Value Ref Range Status   Specimen Description   Final    WOUND Performed at [facility], [address], [place], [place] [address]    Special Requests   Final    EPIDURAL Performed at [facility], [address] [place], [place] [address]    Gram Stain   Final    ABUNDANT WBC PRESENT, PREDOMINANTLY PMN NO ORGANISMS SEEN    Culture   Final    NO GROWTH 2  DAYS NO ANAEROBES ISOLATED; CULTURE IN PROGRESS FOR 5 DAYS Performed at [facility], [place] [address], [place], [place] [address]    Report Status PENDING  Incomplete  Aerobic/Anaerobic Culture w Gram Stain (surgical/deep wound)     Status: None (Preliminary result)   Collection Time: 06/17/20  5:55 PM   Specimen: PATH Other; Body Fluid  Result Value Ref Range Status   Specimen Description   Final    WOUND Performed at [facility], [address], [place], [place] [address]    Special Requests   Final    THORACIC Performed at [facility], [address] [place], [address] [address]    Gram Stain   Final    RARE WBC PRESENT, PREDOMINANTLY PMN NO ORGANISMS SEEN    Culture   Final    NO GROWTH 2 DAYS NO ANAEROBES ISOLATED; CULTURE IN PROGRESS FOR 5 DAYS Performed at [place] [address], [place], [place] [address]     Report Status PENDING  Incomplete  Aerobic/Anaerobic Culture (surgical/deep wound)     Status: None (Preliminary result)   Collection Time: 06/19/20 11:10 AM   Specimen: Abscess  Result Value Ref Range Status   Specimen Description   Final    ABSCESS LEFT LUMBAR FLUID/MYOSITIS Performed at [facility], [address], [place], [place] [address]    Special Requests   Final    NONE Performed at [facility], [address] [place], [place] [address]    Gram Stain   Final    MODERATE WBC PRESENT,BOTH PMN AND MONONUCLEAR NO ORGANISMS SEEN    Culture   Final    NO GROWTH < 24 HOURS Performed at [facility], [place] [address] [place], [place] [address]    Report Status PENDING  Incomplete     Scheduled Meds: . chlorhexidine  15 mL Mouth Rinse BID  . Chlorhexidine Gluconate Cloth  6 each Topical Q0600  . diltiazem  120 mg Oral Daily  . gabapentin  200 mg Oral BID  . insulin aspart  0-15 Units Subcutaneous TID WC  . insulin aspart  0-5 Units Subcutaneous QHS  . insulin aspart  6 Units Subcutaneous TID WC  . insulin glargine  36 Units Subcutaneous Daily  . levothyroxine  150 mcg Oral Q0600  . losartan  100 mg Oral Daily  . magnesium oxide  400 mg Oral Daily  . mouth rinse  15 mL Mouth Rinse q12n4p  . pantoprazole  40 mg Oral Daily  . potassium chloride  20 mEq Oral Daily  . sodium chloride flush  10-40 mL Intracatheter Q12H  . thiamine injection  100 mg Intravenous Daily  . traZODone  50 mg Oral QHS  . valACYclovir  1,000 mg Oral BID   Continuous Infusions: . sodium chloride Stopped (06/18/20 1437)  . cefTRIAXone (ROCEPHIN)  IV 2 g (06/20/20 0527)     LOS: 11 days   Cherene Altes, MD [facility] Office  [phone] Pager - Text Page per Amion  If 7PM-7AM, please contact night-coverage per Amion 06/20/2020, 9:11 AM

## 2020-06-20 NOTE — Progress Notes (Addendum)
Date of Admission:  06/09/2020 appropriate antibiotics since admission.    ID: Tonya Myers is a 55 y.o. female  Principal Problem:   Acute encephalopathy Active Problems:   CLL (chronic lymphocytic leukemia) (HCC)   Hypothyroidism   Essential hypertension   Uncontrolled type 2 diabetes mellitus with hyperglycemia, without long-term current use of insulin (HCC)   Aphasia   Hyperglycemic crisis in diabetes mellitus (HCC)   Acute metabolic encephalopathy   Severe sepsis (HCC)   Pneumococcal meningitis   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HCC)   Epidural abscess   Hypernatremia   Hypomagnesemia   Obesity (BMI 30.0-34.9)    Subjective: Patient was more interactive this morning when  her daughter was at bedside. Seen by speech.  Assessed for swallow She was eating, trying to feed herself. She got oxycodone for pain and she is sleeping now   Medications:  . chlorhexidine  15 mL Mouth Rinse BID  . Chlorhexidine Gluconate Cloth  6 each Topical Q0600  . diltiazem  120 mg Oral Daily  . enoxaparin (LOVENOX) injection  0.5 mg/kg Subcutaneous Q24H  . gabapentin  200 mg Oral BID  . insulin aspart  0-15 Units Subcutaneous TID WC  . insulin aspart  0-5 Units Subcutaneous QHS  . insulin aspart  6 Units Subcutaneous TID WC  . insulin glargine  36 Units Subcutaneous Daily  . levothyroxine  150 mcg Oral Q0600  . losartan  100 mg Oral Daily  . magnesium oxide  400 mg Oral Daily  . mouth rinse  15 mL Mouth Rinse q12n4p  . pantoprazole  40 mg Oral Daily  . potassium chloride  20 mEq Oral Daily  . sodium chloride flush  10-40 mL Intracatheter Q12H  . thiamine injection  100 mg Intravenous Daily  . traZODone  50 mg Oral QHS  . valACYclovir  1,000 mg Oral BID    Objective: Vital signs in last 24 hours: Temp:  [98.3 F (36.8 C)-99.9 F (37.7 C)] 98.6 F (37 C) (02/25 0719) Pulse Rate:  [80-109] 99 (02/25 0719) Resp:  [16-20] 16 (02/25 0719) BP: (120-138)/(69-94) 128/71 (02/25 0719) SpO2:   [88 %-100 %] 89 % (02/25 0719)  PHYSICAL EXAM:  General: Somnolent, on calling her name she wakes up and says a few words and goes back to sleep   head: Normocephalic, without obvious abnormality, atraumatic. Eyes: Conjunctivae clear, anicteric sclerae. Pupils are equal Back: Did not examine Lungs: Bilateral air entry Heart: Tachycardic Abdomen: Soft, non-tender,not distended. Bowel sounds normal. No masses Extremities: Edema feet and ankles Skin: No rashes or lesions. Or bruising Lymph: Cervical, supraclavicular normal. Neurologic: Cannot be assessed  Lab Results Recent Labs    06/19/20 0605 06/20/20 0600  WBC 11.7* 12.1*  HGB 8.0* 8.3*  HCT 25.5* 27.1*  NA 138 141  K 3.6 3.6  CL 104 107  CO2 24 27  BUN 18 18  CREATININE 0.83 0.91   Liver Panel Recent Labs    06/19/20 0605 06/20/20 0600  PROT 5.7* 5.9*  ALBUMIN 1.9* 2.0*  AST 42* 91*  ALT 37 57*  ALKPHOS 207* 215*  BILITOT 0.6 0.7   Sedimentation Rate No results for input(s): ESRSEDRATE in the last 72 hours. C-Reactive Protein No results for input(s): CRP in the last 72 hours.  Microbiology:  Studies/Results: [place] FINE NEEDLE ASP 1ST LESION  Result Date: 06/19/2020 INDICATION: 55 year old female referred for aspiration of paraspinal fluid lumbar region EXAM: ULTRASOUND-GUIDED ASPIRATION MEDICATIONS: The patient is currently admitted to the hospital and receiving  intravenous antibiotics. The antibiotics were administered within an appropriate time frame prior to the initiation of the procedure. ANESTHESIA/SEDATION: Fentanyl 25 mcg IV; Versed 0.5 mg IV Moderate Sedation Time:  12 minutes The patient was continuously monitored during the procedure by the interventional radiology nurse under my direct supervision. COMPLICATIONS: None PROCEDURE: Informed written consent was obtained from the patient after a thorough discussion of the procedural risks, benefits and alternatives. All questions were addressed. Maximal  Sterile Barrier Technique was utilized including caps, mask, sterile gowns, sterile gloves, sterile drape, hand hygiene and skin antiseptic. A timeout was performed prior to the initiation of the procedure. Patient position prone position with ultrasound images stored sent to PACs. Once the patient is prepped and draped in the usual sterile fashion, 1% lidocaine was used for local anesthesia. Small stab incision was made with 11 blade scalpel. Yueh needle was advanced with ultrasound guidance into the hypoechoic region within the paraspinal musculature. Two separate needle position was required for the initial aspiration of approximately 1 cc-2 cc of purulent fluid from the left-sided lumbar para muscular spina trip. Sample was sent for culture. A third needle position was achieved with another Yueh needle under ultrasound guidance, with some scant complex bloody fluid aspirated. Final image was stored. Patient tolerated the procedure well and remained hemodynamically stable throughout. No complications were encountered and no significant blood loss. IMPRESSION: Status post ultrasound-guided aspiration of paraspinal muscular fluid collection, yielding approximately 3 cc of purulent material. Sample was sent for culture. Overall ultrasound appearance is compatible with edema without a focal formed abscess. Signed, Dulcy Fanny. Dellia Nims, RPVI [facility] Radiology Electronically Signed   By: Corrie Mckusick D.O.   On: 06/19/2020 15:13     Assessment/Plan:  Strep pneumo meningitis Strep pneumo bacteremia Extensive thoracic epidural abscess status post multilevel laminectomies.  Drainage of abscess.  She had 3 drains to have been removed Patient is on ceftriaxone 2 g IV every 12.  Will need at least for 6 weeks  Lumbar paraspinal abscess drained by IR Lumbar facet joint infection on the left side  AKI resolved  Diabetes mellitus: On Lantus Hypothyroidism on  Synthroid  CLL in remission  Anemia  Patient appears sedated with even 5 mg of oxycodone ? NSAID  Discussed the management with her nurse and care team.. ID will follow her peripherally this weekend.  Call if needed.

## 2020-06-20 NOTE — Progress Notes (Addendum)
Speech Language Pathology Treatment: Dysphagia  Patient Details Name: Tonya Myers MRN: [id number] DOB: March 31, 1966 Today's Date: 06/20/2020 Time: [id number] SLP Time Calculation (min) (ACUTE ONLY): 40 min  Assessment / Plan / Recommendation Clinical Impression  Pt seen for ongoing assessment/treatment for dysphagia -- assessment of readiness for advancement diet consistency. Pt is s/p IR drainage and tx of paraspinal abscess; ongoing encephalopathy. Pt is much more awake/alert and responds to questions though with min delayed processing/responses intermittently == much improved State vs yesterday. She was able to state recent procedure and discomfort in neck to abscess/drainage. Noted head turn to her Left but able to bring to midline. Positioned upright and assisted head support, pillows.  Pt exhibited min apraxic-like confusion in using UEs for self-feeding of finger foods provided by SLP. Food prep provided but pt encouraged to self-feed as much as possible. Pt consumed trials of thin liquids via Straw, chopped foods, and solids moistened w/ no overt gross oral phase deficits noted. She demonstrated appropriate attention to boluses for adequate bolus management, mastication, then oral clearing. Timely A-P transfer occurred; No prolonged mastication or residue noted. No overt s/s of aspiration noted w/ trials of solids nor thin liquids via straw.  With pt's improved State and attention to task, recommend Regular diet(to encourage oral intake also w/ options) with thin liquids, Pills Whole in Puree as able vs crushed, monitoring and supervision at meals to assist w/ feeding as needed BUT encourage pt to self-feed using finger foods and Holding Cup. Give po's only when pt is fully awake/alert to safely participate in po tasks. SLP will continue to follow for Education on general precautions and toleration of upgraded diet.   Pt would benefit from f/u w/ formal assessment of Cognitive-linguistic  functioning sec. to encephalopathy once medical status and infection improve to determine if any needs in order to maximize verbal communication in ADLs. Team updated.      HPI HPI: Tonya Myers is a 55 y.o. female admitted with aphasia out of proportion to encephalopathy, fever and tachycardia x 2 days. 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   Pt admitted from home, independent w/ ADLs prior w/ no communication deficits, on 06/09/20 after presenting with AMS. Pt being treated for acute encephalopathy. 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 Pt also found to have paraspinal abscess, small epidural abscess, septic arthritis L2-L3 and L3-L4 with Neurosurgery & IR recommended serial imaging. Now s/p T3-4, T5-7, T9-10 laminectomy and evacuation of abscess (06/17/20); pending IR drainage of paraspinal abscess later this date (06/19/20).  PMH: hypothyroidism, DDD, cervical radiculopathy, CLL, DM, HTN, HLD, anxiety, arthritis, CA, chronic back pain, asthma, depression, vertigo.      SLP Plan  Continue with current plan of care (educatoin on precautions)       Recommendations  Diet recommendations: Regular;Thin liquid Liquids provided via: Cup;Straw (monitor) Medication Administration: Whole meds with puree (vs need to Crush for ease of swallow) Supervision: Staff to assist with self feeding;Full supervision/cueing for compensatory strategies (encourage pt to self-feed finger foods) Compensations: Minimize environmental distractions;Slow rate;Small sips/bites;Lingual sweep for clearance of pocketing;Follow solids with liquid Postural Changes and/or Swallow Maneuvers: Seated upright 90 degrees;Out of bed for meals;Upright 30-60 min after meal                General recommendations:  (Dietician f/u) Oral Care Recommendations: Oral care BID;Oral care before and after PO;Staff/trained caregiver to provide oral care Follow up Recommendations:   ([name] -- Cognitive-linguistic eval/tx)  SLP Visit Diagnosis: Dysphagia, unspecified (R13.10) Plan: Continue with current plan of care (educatoin on precautions)       GO                 Orinda Kenner, [place], CCC-SLP Speech Language Pathologist Rehab Services [phone] [facility] 06/20/2020, 10:19 AM

## 2020-06-20 NOTE — Progress Notes (Signed)
    Attending Progress Note  History: Tonya Myers is here for epidural abscess.  POD3: No events overnight. She is stable and well.  POD2: No events overnight. Her legs continue to feel better than preop  POD1: Tonya Myers did well overnight.  She has increased movement and feeling in her legs.    Physical Exam: Vitals:   06/19/20 2325 06/20/20 0719  BP: 134/69 128/71  Pulse: (!) 104 99  Resp: 16 16  Temp: 98.3 F (36.8 C) 98.6 F (37 C)  SpO2: (!) 88%     AA Ox3 CNI  Strength:  UE diffusely 4-4+/5 except grip, which is 4- 5/5 throughout BLE except IP which is 4/5 bilaterally  Data:  Recent Labs  Lab 06/17/20 0640 06/19/20 0605 06/20/20 0600  NA 137 138 141  K 3.4* 3.6 3.6  CL 104 104 107  CO2 26 24 27   BUN 15 18 18   CREATININE 0.93 0.83 0.91  GLUCOSE 276* 343* 153*  CALCIUM 8.3* 7.5* 8.2*   Recent Labs  Lab 06/20/20 0600  AST 91*  ALT 57*  ALKPHOS 215*     Recent Labs  Lab 06/17/20 0640 06/19/20 0605 06/20/20 0600  WBC 11.0* 11.7* 12.1*  HGB 8.6* 8.0* 8.3*  HCT 27.1* 25.5* 27.1*  PLT 257 385 436*   No results for input(s): APTT, INR in the last 168 hours.       Other tests/results: cultures pending  Drains - slow drainage overnight  Assessment/Plan:  Tonya Myers is doing well after multilevel laminectomy for washout of epidural abscesses.  She also has cervical stenosis and myelopathy, but this is clinically stable.  - continue drains. Will remove 1 drain today. - Abx per ID. - pain control - DVT prophylaxis ok - PTOT    Meade Maw MD, [facility] Department of Neurosurgery

## 2020-06-20 NOTE — Progress Notes (Signed)
   06/20/20   To Whom it may concern,  Tonya Myers was admitted to [facility] on 06/09/2020 and remains under my care as an inpatient. She will require ongoing hospitalization for the foreseeable future. Due to her medical condition she is not able to make decisions for herself or care for herself, and therefore the presence of her family is required to provide for her care properly. This is expected to be the case until such time that she is able to be discharged, or her mental status improves. At this time I can not make a reasonable prediction as to when either may occur.   Sincerely,  Cherene Altes, MD [facility] Office  [phone]

## 2020-06-21 DIAGNOSIS — G9340 Encephalopathy, unspecified: Secondary | ICD-10-CM | POA: Diagnosis not present

## 2020-06-21 DIAGNOSIS — G062 Extradural and subdural abscess, unspecified: Secondary | ICD-10-CM | POA: Diagnosis not present

## 2020-06-21 LAB — COMPREHENSIVE METABOLIC PANEL
ALT: 116 U/L — ABNORMAL HIGH (ref 0–44)
AST: 198 U/L — ABNORMAL HIGH (ref 15–41)
Albumin: 2 g/dL — ABNORMAL LOW (ref 3.5–5.0)
Alkaline Phosphatase: 245 U/L — ABNORMAL HIGH (ref 38–126)
Anion gap: 7 (ref 5–15)
BUN: 18 mg/dL (ref 6–20)
CO2: 26 mmol/L (ref 22–32)
Calcium: 8.1 mg/dL — ABNORMAL LOW (ref 8.9–10.3)
Chloride: 107 mmol/L (ref 98–111)
Creatinine, Ser: 0.89 mg/dL (ref 0.44–1.00)
GFR, Estimated: >60 mL/min (ref >60)
Glucose, Bld: 140 mg/dL — ABNORMAL HIGH (ref 70–99)
Potassium: 4 mmol/L (ref 3.5–5.1)
Sodium: 140 mmol/L (ref 135–145)
Total Bilirubin: 0.9 mg/dL (ref 0.3–1.2)
Total Protein: 6.1 g/dL — ABNORMAL LOW (ref 6.5–8.1)

## 2020-06-21 LAB — AMMONIA: Ammonia: 10 umol/L (ref 9–35)

## 2020-06-21 LAB — CBC
HCT: 26.6 % — ABNORMAL LOW (ref 36.0–46.0)
Hemoglobin: 8.1 g/dL — ABNORMAL LOW (ref 12.0–15.0)
MCH: 25.3 pg — ABNORMAL LOW (ref 26.0–34.0)
MCHC: 30.5 g/dL (ref 30.0–36.0)
MCV: 83.1 fL (ref 80.0–100.0)
Platelets: 468 10*3/uL — ABNORMAL HIGH (ref 150–400)
RBC: 3.2 MIL/uL — ABNORMAL LOW (ref 3.87–5.11)
RDW: 17.3 % — ABNORMAL HIGH (ref 11.5–15.5)
WBC: 10.1 10*3/uL (ref 4.0–10.5)
nRBC: 0 % (ref 0.0–0.2)

## 2020-06-21 LAB — GLUCOSE, CAPILLARY
Glucose-Capillary: 185 mg/dL — ABNORMAL HIGH (ref 70–99)
Glucose-Capillary: 241 mg/dL — ABNORMAL HIGH (ref 70–99)
Glucose-Capillary: 194 mg/dL — ABNORMAL HIGH (ref 70–99)
Glucose-Capillary: 134 mg/dL — ABNORMAL HIGH (ref 70–99)

## 2020-06-21 LAB — MAGNESIUM: Magnesium: 1.7 mg/dL (ref 1.7–2.4)

## 2020-06-21 MED ORDER — ACETAMINOPHEN 325 MG PO TABS
325.0000 mg | ORAL_TABLET | Freq: Four times a day (QID) | ORAL | Status: DC | PRN
  Administered 2020-06-22: 325 mg via ORAL
  Filled 2020-06-21: qty 1

## 2020-06-21 MED ORDER — SODIUM CHLORIDE 0.9 % IV BOLUS
500.0000 mL | Freq: Once | INTRAVENOUS | Status: AC
  Administered 2020-06-21: 500 mL via INTRAVENOUS

## 2020-06-21 MED ORDER — SODIUM CHLORIDE 0.9 % IV SOLN: INTRAVENOUS | Status: DC

## 2020-06-21 MED ORDER — INSULIN ASPART 100 UNIT/ML ~~LOC~~ SOLN
3.0000 [IU] | Freq: Three times a day (TID) | SUBCUTANEOUS | Status: DC
  Administered 2020-06-21 – 2020-06-24 (×9): 3 [IU] via SUBCUTANEOUS
  Filled 2020-06-21 (×7): qty 1

## 2020-06-21 NOTE — Progress Notes (Signed)
  Speech Language Pathology Treatment: Dysphagia  Patient Details Name: Tonya Myers MRN: [id number] DOB: [birth year] Today's Date: 06/21/2020 Time: 7824-2353 SLP Time Calculation (min) (ACUTE ONLY): 25 min  Assessment / Plan / Recommendation Clinical Impression  Pt visited at meal and with med pass. Pt tolerated regular omlet with no s/s of aspiration. Able to feed herself for a few minutes but then said she was too tired. Oral transit delay with solids, mild to moderate oral residue but cleared with cues to alternate with liquids. Pt ate all of applesauce but only a few bites of solid foods. She tolerated thjem well but fatigued quickly. She reports she likes the regualar solids much better. Took all of liquids without difficulty. Rec continue with current diet. Notify ST of any difficulty chewing or swallowing.   HPI HPI: Tonya Myers is a 55 y.o. female admitted with aphasia out of proportion to encephalopathy, fever and tachycardia x 2 days. 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      SLP Plan   F/u through chart 2-3 days or if Nsg reports difficulty swallowing. Needs intermittent assistance and supervision.       Recommendations  Diet recommendations: Regular Liquids provided via: Cup;Straw Medication Administration: Whole meds with puree Supervision: Staff to assist with self feeding;Intermittent supervision to cue for compensatory strategies Compensations: Minimize environmental distractions;Slow rate;Small sips/bites;Lingual sweep for clearance of pocketing;Follow solids with liquid Postural Changes and/or Swallow Maneuvers: Seated upright 90 degrees;Out of bed for meals;Upright 30-60 min after meal                Follow up Recommendations: Inpatient Rehab;Skilled Nursing facility SLP Visit Diagnosis: Dysphagia, unspecified (R13.10)       GO                Lucila Maine 06/21/2020, 10:31 AM

## 2020-06-21 NOTE — Progress Notes (Signed)
Subjective: Subjectively improving in terms of confusion and limb weakness since her multilevel laminectomy for washout of epidural abscesses with drain placements. She is POD #4.  Objective: Current vital signs: BP 133/77 (BP Location: Left Arm)   Pulse 77   Temp 98 F (36.7 C) (Axillary)   Resp 16   Ht 6' (1.829 m)   Wt 104.3 kg   SpO2 98%   BMI 31.19 kg/m  Vital signs in last 24 hours: Temp:  [97.3 F (36.3 C)-100.2 F (37.9 C)] 98 F (36.7 C) (02/26 1629) Pulse Rate:  [77-108] 77 (02/26 1629) Resp:  [16-20] 16 (02/26 1629) BP: (88-133)/(49-77) 133/77 (02/26 1629) SpO2:  [95 %-99 %] 98 % (02/26 1629)  Intake/Output from previous day: 02/25 0701 - 02/26 0700 In: -  Out: 0354 [Urine:1200; Drains:5] Intake/Output this shift: Total I/O In: 725 [I.V.:125; IV Piggyback:600] Out: -  Nutritional status:  Diet Order            Diet regular Room service appropriate? Yes with Assist; Fluid consistency: Thin  Diet effective now                HEENT: Nondiaphoretic. Forehead temperature is normal to touch. [place]/AT. Has right sternocleidomastoid muscle spasm.  Lungs: Respirations unlabored Ext: Warm and well perfused.   Neurologic Exam: Ment: Awake with mildly to moderately decreased level of alertness as well as confusion. No agitation noted. Speech output is limited but fluent, without dysarthria. Bradyphrenic, with slow replies to questions and slowed responses commands. Oriented to self, circumstance and hospital, but does not know the name of the hospital. She is oriented to city and state after a significant delay in responding. Not oriented to the day of the week, but knows the year and month.  CN: EOMI. Pupils equal. Tracks and fixates normally. Face symmetric. Tongue midline. Head is rotated to the left in the context of right SCM spasm.  Motor:  Upper extremities: 4/5 BUE strength proximally and distally.  Lower extremities: 2/5 iliopsoas bilaterally, 3/5 right  quadriceps, 4-/5 left quadriceps and 4/5 APF/ADF bilaterally.  Sensory: Temp and FT sensation is intact to upper and lower extremities proximally and distally. Reflexes: 2+ bilateral brachioradialis, patellar and achilles reflexes. Toes downgoing.  Cerebellar: No ataxia with FNF bilaterally, but with slow movement.  Gait: Unable to assess  Lab Results: Results for orders placed or performed during the hospital encounter of 06/09/20 (from the past 48 hour(s))  Glucose, capillary     Status: Abnormal   Collection Time: 06/19/20  8:08 PM  Result Value Ref Range   Glucose-Capillary 222 (H) 70 - 99 mg/dL    Comment: Glucose reference range applies only to samples taken after fasting for at least 8 hours.  Ammonia     Status: Abnormal   Collection Time: 06/20/20  5:45 AM  Result Value Ref Range   Ammonia <9 (L) 9 - 35 umol/L    Comment: Performed at [facility], [address] [place], [place] [address]  CBC     Status: Abnormal   Collection Time: 06/20/20  6:00 AM  Result Value Ref Range   WBC 12.1 (H) 4.0 - 10.5 K/uL   RBC 3.27 (L) 3.87 - 5.11 MIL/uL   Hemoglobin 8.3 (L) 12.0 - 15.0 g/dL   HCT 27.1 (L) 36.0 - 46.0 %   MCV 82.9 80.0 - 100.0 fL   MCH 25.4 (L) 26.0 - 34.0 pg   MCHC 30.6 30.0 - 36.0 g/dL   RDW 17.1 (  H) 11.5 - 15.5 %   Platelets 436 (H) 150 - 400 K/uL   nRBC 0.0 0.0 - 0.2 %    Comment: Performed at [facility], [address] [place], [place] [address]  Folate     Status: None   Collection Time: 06/20/20  6:00 AM  Result Value Ref Range   Folate 14.3 >5.9 ng/mL    Comment: Performed at [facility], [address] [place], [place] [address]  Iron and TIBC     Status: Abnormal   Collection Time: 06/20/20  6:00 AM  Result Value Ref Range   Iron 15 (L) 28 - 170 ug/dL   TIBC 188 (L) 250 - 450 ug/dL   Saturation Ratios 8 (L) 10.4 - 31.8 %   UIBC 173 ug/dL    Comment: Performed at [facility], [address]  [place], [place] [address]  Ferritin     Status: Abnormal   Collection Time: 06/20/20  6:00 AM  Result Value Ref Range   Ferritin 803 (H) 11 - 307 ng/mL    Comment: Performed at [facility], [address] [place], [place] [address]  Reticulocytes     Status: Abnormal   Collection Time: 06/20/20  6:00 AM  Result Value Ref Range   Retic Ct Pct 2.0 0.4 - 3.1 %   RBC. 3.31 (L) 3.87 - 5.11 MIL/uL   Retic Count, Absolute 66.2 19.0 - 186.0 K/uL   Immature Retic Fract 21.7 (H) 2.3 - 15.9 %    Comment: Performed at [facility], [address] [place], [place] [address]  Comprehensive metabolic panel     Status: Abnormal   Collection Time: 06/20/20  6:00 AM  Result Value Ref Range   Sodium 141 135 - 145 mmol/L   Potassium 3.6 3.5 - 5.1 mmol/L   Chloride 107 98 - 111 mmol/L   CO2 27 22 - 32 mmol/L   Glucose, Bld 153 (H) 70 - 99 mg/dL    Comment: Glucose reference range applies only to samples taken after fasting for at least 8 hours.   BUN 18 6 - 20 mg/dL   Creatinine, Ser 0.91 0.44 - 1.00 mg/dL   Calcium 8.2 (L) 8.9 - 10.3 mg/dL   Total Protein 5.9 (L) 6.5 - 8.1 g/dL   Albumin 2.0 (L) 3.5 - 5.0 g/dL   AST 91 (H) 15 - 41 U/L   ALT 57 (H) 0 - 44 U/L   Alkaline Phosphatase 215 (H) 38 - 126 U/L   Total Bilirubin 0.7 0.3 - 1.2 mg/dL   GFR, Estimated >60 >60 mL/min    Comment: (NOTE) Calculated using the CKD-EPI Creatinine Equation (2021)    Anion gap 7 5 - 15    Comment: Performed at [facility], [address] [place], [place] [address]  Vitamin B12     Status: Abnormal   Collection Time: 06/20/20  6:01 AM  Result Value Ref Range   Vitamin B-12 1,396 (H) 180 - 914 pg/mL    Comment: (NOTE) This assay is not validated for testing neonatal or myeloproliferative syndrome specimens for Vitamin B12 levels. Performed at [facility], [place] [address], [place], [place] [address]   Glucose, capillary     Status: Abnormal   Collection Time: 06/20/20  7:31  AM  Result Value Ref Range   Glucose-Capillary 137 (H) 70 - 99 mg/dL    Comment: Glucose reference range applies only to samples taken after fasting  for at least 8 hours.   Comment 1 Notify RN   Glucose, capillary     Status: Abnormal   Collection Time: 06/20/20 12:56 PM  Result Value Ref Range   Glucose-Capillary 349 (H) 70 - 99 mg/dL    Comment: Glucose reference range applies only to samples taken after fasting for at least 8 hours.  Glucose, capillary     Status: Abnormal   Collection Time: 06/20/20  4:15 PM  Result Value Ref Range   Glucose-Capillary 206 (H) 70 - 99 mg/dL    Comment: Glucose reference range applies only to samples taken after fasting for at least 8 hours.  Glucose, capillary     Status: Abnormal   Collection Time: 06/20/20  8:15 PM  Result Value Ref Range   Glucose-Capillary 103 (H) 70 - 99 mg/dL    Comment: Glucose reference range applies only to samples taken after fasting for at least 8 hours.  Comprehensive metabolic panel     Status: Abnormal   Collection Time: 06/21/20  5:32 AM  Result Value Ref Range   Sodium 140 135 - 145 mmol/L   Potassium 4.0 3.5 - 5.1 mmol/L   Chloride 107 98 - 111 mmol/L   CO2 26 22 - 32 mmol/L   Glucose, Bld 140 (H) 70 - 99 mg/dL    Comment: Glucose reference range applies only to samples taken after fasting for at least 8 hours.   BUN 18 6 - 20 mg/dL   Creatinine, Ser 0.89 0.44 - 1.00 mg/dL   Calcium 8.1 (L) 8.9 - 10.3 mg/dL   Total Protein 6.1 (L) 6.5 - 8.1 g/dL   Albumin 2.0 (L) 3.5 - 5.0 g/dL   AST 198 (H) 15 - 41 U/L   ALT 116 (H) 0 - 44 U/L   Alkaline Phosphatase 245 (H) 38 - 126 U/L   Total Bilirubin 0.9 0.3 - 1.2 mg/dL   GFR, Estimated >60 >60 mL/min    Comment: (NOTE) Calculated using the CKD-EPI Creatinine Equation (2021)    Anion gap 7 5 - 15    Comment: Performed at [facility], [address] [place], [place] [address]  Ammonia     Status: None   Collection Time: 06/21/20  5:32 AM  Result Value  Ref Range   Ammonia 10 9 - 35 umol/L    Comment: Performed at [facility], [address] [place], [place] [address]  CBC     Status: Abnormal   Collection Time: 06/21/20  5:32 AM  Result Value Ref Range   WBC 10.1 4.0 - 10.5 K/uL   RBC 3.20 (L) 3.87 - 5.11 MIL/uL   Hemoglobin 8.1 (L) 12.0 - 15.0 g/dL   HCT 26.6 (L) 36.0 - 46.0 %   MCV 83.1 80.0 - 100.0 fL   MCH 25.3 (L) 26.0 - 34.0 pg   MCHC 30.5 30.0 - 36.0 g/dL   RDW 17.3 (H) 11.5 - 15.5 %   Platelets 468 (H) 150 - 400 K/uL   nRBC 0.0 0.0 - 0.2 %    Comment: Performed at [facility], [address] [place], [place] [address]  Magnesium     Status: None   Collection Time: 06/21/20  5:32 AM  Result Value Ref Range   Magnesium 1.7 1.7 - 2.4 mg/dL    Comment: Performed at [facility], [address] [place], [place] [address]  Glucose, capillary     Status: Abnormal   Collection Time: 06/21/20  8:31 AM  Result Value Ref Range   Glucose-Capillary 185 (H) 70 - 99 mg/dL    Comment: Glucose reference range applies only to samples taken after fasting for at least 8 hours.  Glucose, capillary     Status: Abnormal   Collection Time: 06/21/20 11:50 AM  Result Value Ref Range   Glucose-Capillary 241 (H) 70 - 99 mg/dL    Comment: Glucose reference range applies only to samples taken after fasting for at least 8 hours.  Glucose, capillary     Status: Abnormal   Collection Time: 06/21/20  4:28 PM  Result Value Ref Range   Glucose-Capillary 194 (H) 70 - 99 mg/dL    Comment: Glucose reference range applies only to samples taken after fasting for at least 8 hours.    Recent Results (from the past 240 hour(s))  Culture, blood (Routine X 2) w Reflex to ID Panel     Status: None   Collection Time: 06/12/20 12:41 AM   Specimen: BLOOD  Result Value Ref Range Status   Specimen Description BLOOD BLOOD RIGHT HAND  Final   Special Requests   Final    BOTTLES DRAWN AEROBIC AND ANAEROBIC Blood Culture adequate  volume   Culture   Final    NO GROWTH 5 DAYS Performed at [facility], [address], [place], [place] [address]    Report Status 06/17/2020 FINAL  Final  Culture, blood (Routine X 2) w Reflex to ID Panel     Status: None   Collection Time: 06/12/20 12:41 AM   Specimen: BLOOD  Result Value Ref Range Status   Specimen Description BLOOD BLOOD LEFT HAND  Final   Special Requests IN PEDIATRIC BOTTLE Blood Culture adequate volume  Final   Culture   Final    NO GROWTH 5 DAYS Performed at [facility], [address] [place], [place] [address]    Report Status 06/17/2020 FINAL  Final  Aerobic/Anaerobic Culture w Gram Stain (surgical/deep wound)     Status: None (Preliminary result)   Collection Time: 06/17/20  5:52 PM   Specimen: PATH Other; Body Fluid  Result Value Ref Range Status   Specimen Description   Final    WOUND Performed at [facility], [address], [place], [place] [address]    Special Requests   Final    EPIDURAL Performed at [facility], [address] [place], [place] [address]    Gram Stain   Final    ABUNDANT WBC PRESENT, PREDOMINANTLY PMN NO ORGANISMS SEEN    Culture   Final    NO GROWTH 4 DAYS NO ANAEROBES ISOLATED; CULTURE IN PROGRESS FOR 5 DAYS Performed at [place] [address] [place], [place] [address]    Report Status PENDING  Incomplete  Aerobic/Anaerobic Culture w Gram Stain (surgical/deep wound)     Status: None (Preliminary result)   Collection Time: 06/17/20  5:55 PM   Specimen: PATH Other; Body Fluid  Result Value Ref Range Status   Specimen Description   Final    WOUND Performed at [facility], [address] [place], [place] [address]    Special Requests   Final    THORACIC Performed at [facility], [address] [place], [place] [address]    Gram Stain   Final    RARE WBC PRESENT, PREDOMINANTLY PMN NO ORGANISMS SEEN    Culture   Final    NO GROWTH 4  DAYS NO ANAEROBES  ISOLATED; CULTURE IN PROGRESS FOR 5 DAYS Performed at [facility], [place] [address] [place], [place] [address]    Report Status PENDING  Incomplete  Aerobic/Anaerobic Culture (surgical/deep wound)     Status: None (Preliminary result)   Collection Time: 06/19/20 11:10 AM   Specimen: Abscess  Result Value Ref Range Status   Specimen Description   Final    ABSCESS LEFT LUMBAR FLUID/MYOSITIS Performed at [facility], [address] [place], [place] [address]    Special Requests   Final    NONE Performed at [facility], [address] [place], [place] [address]    Gram Stain   Final    MODERATE WBC PRESENT,BOTH PMN AND MONONUCLEAR NO ORGANISMS SEEN    Culture   Final    NO GROWTH 2 DAYS NO ANAEROBES ISOLATED; CULTURE IN PROGRESS FOR 5 DAYS Performed at [facility], [place] [address] [place],  [address]    Report Status PENDING  Incomplete    Lipid Panel No results for input(s): CHOL, TRIG, HDL, CHOLHDL, VLDL, LDLCALC in the last 72 hours.  Studies/Results: No results found.  Medications:  Scheduled: . chlorhexidine  15 mL Mouth Rinse BID  . Chlorhexidine Gluconate Cloth  6 each Topical Q0600  . enoxaparin (LOVENOX) injection  0.5 mg/kg Subcutaneous Q24H  . insulin aspart  0-15 Units Subcutaneous TID WC  . insulin aspart  0-5 Units Subcutaneous QHS  . insulin aspart  3 Units Subcutaneous TID WC  . insulin glargine  36 Units Subcutaneous Daily  . levothyroxine  150 mcg Oral Q0600  . mouth rinse  15 mL Mouth Rinse q12n4p  . pantoprazole  40 mg Oral Daily  . potassium chloride  20 mEq Oral Daily  . sodium chloride flush  10-40 mL Intracatheter Q12H  . thiamine  100 mg Oral Daily  . valACYclovir  1,000 mg Oral BID   Continuous: . sodium chloride 125 mL/hr at 06/21/20 1317  . cefTRIAXone (ROCEPHIN)  IV 2 g (06/21/20 1701)    Assessment: 55 year old.femalewith CLL (in remission) and uncontrolled DM2 who was  admitted with strep pneumoniae bacteremia, pneumococcal meningitis and paraspinal abscesses. LP performed on 2/15 confirmed meningitis. She was initially improving on ABX, butBLE strength was noted to have worsened on Monday. Repeat MRI of thoracic spine revealed prominent longitudinally extensive epidural abscess posterior to the thoracic spinal cord, with diffuse thoracic spinal cord compression. She was taken to the OR by Neurosurgery on 2/22 for T3/4, T5-7 and T9/10 laminectomies and evacuation of abscess. Subsequently, IR has drained her paraspinal abscesses. She is followed by ID and is on IV ceftriaxone. Since the epidural abscess drainage, she has been improving neurologically both in terms of limb strength and mentation.   - On exam today, she has 4/5 BUE strength, 2/5 iliopsoas bilaterally, 3/5 right quadriceps, 4-/5 left quadriceps and 4/5 APF/ADF bilaterally. Temp and FT sensation is intact to upper and lower extremities proximally and distally. Her confusion is improving but still present in the context of severe fatigue.  - Also noted on exam is continued right sternocleidomastoid muscle spasm.  - CSF culture (sample obtained 2/15): STREPTOCOCCUS PNEUMONIAE  Recommendations: - Continue ceftriaxone per ID recommendations for meningitis, as well as residual paraspinal abscesses and epidural abscess.  - Neurosurgery is following her postoperatively. She is POD #4 - Frequent neuro checks.  - Limit sedating meds - PT/OT - For her continued right sternocleidomastoid muscle spasm. Should have support of pillow  to LEFT side of head to keep head positioned at the midline for relief of spasm. Also should benefit from OT treatment of the spasm and massage of right SCM muscle.    LOS: 12 days   @Electronically  signed: Dr. Kerney Elbe 06/21/2020  6:31 PM

## 2020-06-21 NOTE — Progress Notes (Signed)
    Attending Progress Note  History: Tonya Myers is here for epidural abscess.  POD4: No events overnight. She is stable and well. No significant change.  POD3: No events overnight. She is stable and well.  POD2: No events overnight. Her legs continue to feel better than preop  POD1: Tonya Myers did well overnight.  She has increased movement and feeling in her legs.    Physical Exam: Vitals:   06/21/20 0505 06/21/20 0831  BP: 125/75 117/65  Pulse: (!) 102 90  Resp: 16 20  Temp: 100.2 F (37.9 C) 98.8 F (37.1 C)  SpO2: 98% 99%    AA Ox3 CNI  Strength:  UE diffusely 4-4+/5 except grip, which is 4- 5/5 throughout BLE except IP which is 4+/5 bilaterally  Incisions c/d/i  Data:  Recent Labs  Lab 06/19/20 0605 06/20/20 0600 06/21/20 0532  NA 138 141 140  K 3.6 3.6 4.0  CL 104 107 107  CO2 24 27 26   BUN 18 18 18   CREATININE 0.83 0.91 0.89  GLUCOSE 343* 153* 140*  CALCIUM 7.5* 8.2* 8.1*   Recent Labs  Lab 06/21/20 0532  AST 198*  ALT 116*  ALKPHOS 245*     Recent Labs  Lab 06/19/20 0605 06/20/20 0600 06/21/20 0532  WBC 11.7* 12.1* 10.1  HGB 8.0* 8.3* 8.1*  HCT 25.5* 27.1* 26.6*  PLT 385 436* 468*   No results for input(s): APTT, INR in the last 168 hours.       Other tests/results: cultures pending  Drains - slow drainage overnight  Assessment/Plan:  Tonya Myers is doing well after multilevel laminectomy for washout of epidural abscesses.  She also has cervical stenosis and myelopathy, but this is clinically stable.  - Drain removed today.  Dressing taken down.  - Abx per ID. - pain control - DVT prophylaxis ok - PTOT    Meade Maw MD, [facility] Department of Neurosurgery

## 2020-06-21 NOTE — Progress Notes (Signed)
Tonya Myers  KYH:[id number] DOB: 1965/07/22 DOA: 06/09/2020 PCP: [name], MD    Brief Narrative:  55 year old with a history of DM, CLL, and chronic back pain who presented to [facility] with acute onset of AMS and the inability to talk.  MRI of the brain was negative for stroke.  She was found to be in DKA and also suffering with acute kidney injury.  During the course of her hospital stay she became febrile.  A full evaluation ultimately revealed the diagnosis of pneumococcal sepsis and pneumococcal meningitis as confirmed with blood cultures and lumbar puncture.  MRI of the spine revealed a paraspinal abscess as well as an epidural abscess.  Neurosurgery and IR have been consulted and are participating in her care.  Worsening of the patient's peripheral neurologic symptoms led to a repeat MRI of the lumbar spine 2/21 which confirmed an enlarging paraspinal abscess.  Repeat MRI of the cervical and thoracic spine 2/22 noted an enlarging dorsal epidural fluid collection.  Given these findings the patient was taken to the OR by neurosurgery 2/22.  Antimicrobials:  Ceftriaxone 2/15 > Cefepime 2/15 Vancomycin 2/15 > 2/17  DVT prophylaxis: Lovenox   Subjective: Remains afebrile.  Worsening transaminitis noted. The patient is somnolent at the time of my visit. She is mildly hypotensive. This is being treated with a volume challenge. There are no focal neurologic deficits. Her respiratory status is stable. Her CBG is 241.  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 Patient failed conservative antibiotic therapy - Neurosurgery took the patient to the OR 2/22 for a T3/4 laminectomy, T5-7 laminectomy, T9/10 laminectomy and evacuation of abscess - aspiration of lumbar paraspinal abscess completed by IR 2/24 - abx per ID continue, w/ plan for 6 weeks total tx   Fluctuating mental status - Acute metabolic encephalopathy Neurologically  improving otherwise - no other sx to suggest decompensation - follow closely -discontinue all potentially sedating meds - ammonia level normal  Mild transaminitis Unclear etiology -trending upward -if persists imaging of liver to rule out abscess will be necessary -hydrate and recheck in a.m.  Acute kidney injury Creatinine 1.56 at presentation - resolved w/ supportive care  Severely uncontrolled DM2 with DKA at presentation CBG stable within reasonable range -avoid hypoglycemia in setting of intermittent poor intake  Anemia Likely due primarily to severe smoldering infection (anemia panel c/w this) - follow trend - no indication for transfusion at this time  Essential HTN Blood pressure controlled at this time  Obesity - Body mass index is 31.19 kg/m.  CLL  Hyponatremia Resolved  Hypomagnesemia Resolved  Disposition  [name] eval for possible admission    Code Status: FULL CODE Family Communication:  Status is: Inpatient  Remains inpatient appropriate because:Inpatient level of care appropriate due to severity of illness   Dispo: The patient is from: Home              Anticipated d/c is to: unclear              Anticipated d/c date is: > 3 days              Patient currently is not medically stable to d/c.   Difficult to place patient No   Consultants:  Neurosurgery ID Neurology IR  Objective: Blood pressure 117/65, pulse 90, temperature 98.8 F (37.1 C), temperature source Oral, resp. rate 20, height 6' (1.829 m), weight 104.3 kg, SpO2 99 %.  Intake/Output Summary (Last 24 hours) at  06/21/2020 0937 Last data filed at 06/21/2020 0556 Gross per 24 hour  Intake --  Output 905 ml  Net -905 ml   Filed Weights   06/17/20 1621 06/18/20 0500 06/19/20 0500  Weight: 109.1 kg 104.6 kg 104.3 kg    Examination: General: No acute respiratory distress -somnolent, confused Lungs: Clear to auscultation bilaterally -no wheezing or crackles Cardiovascular: RRR   Abdomen: NT/ND, soft, BS positive, no rebound -no apparent tenderness on deep palpation right upper quadrant Extremities: No C/C/E B LE   CBC: Recent Labs  Lab 06/19/20 0605 06/20/20 0600 06/21/20 0532  WBC 11.7* 12.1* 10.1  HGB 8.0* 8.3* 8.1*  HCT 25.5* 27.1* 26.6*  MCV 82.0 82.9 83.1  PLT 385 436* 650*   Basic Metabolic Panel: Recent Labs  Lab 06/16/20 0502 06/17/20 0640 06/18/20 0615 06/19/20 0605 06/20/20 0600 06/21/20 0532  NA 139 137  --  138 141 140  K 3.6 3.4*  --  3.6 3.6 4.0  CL 109 104  --  104 107 107  CO2 23 26  --  24 27 26   GLUCOSE 305* 276*  --  343* 153* 140*  BUN 19 15  --  18 18 18   CREATININE 0.86 0.93  --  0.83 0.91 0.89  CALCIUM 8.3* 8.3*  --  7.5* 8.2* 8.1*  MG 1.8 1.7 1.8 1.7  --  1.7  PHOS 2.8 2.7 3.3  --   --   --    GFR: Estimated Creatinine Clearance: 95.4 mL/min (by C-G formula based on SCr of 0.89 mg/dL).  Liver Function Tests: Recent Labs  Lab 06/19/20 0605 06/20/20 0600 06/21/20 0532  AST 42* 91* 198*  ALT 37 57* 116*  ALKPHOS 207* 215* 245*  BILITOT 0.6 0.7 0.9  PROT 5.7* 5.9* 6.1*  ALBUMIN 1.9* 2.0* 2.0*    HbA1C: Hgb A1c MFr Bld  Date/Time Value Ref Range Status  06/09/2020 08:37 PM 8.0 (H) 4.8 - 5.6 % Final    Comment:    (NOTE)         Prediabetes: 5.7 - 6.4         Diabetes: >6.4         Glycemic control for adults with diabetes: <7.0     CBG: Recent Labs  Lab 06/20/20 0731 06/20/20 1256 06/20/20 1615 06/20/20 2015 06/21/20 0831  GLUCAP 137* 349* 206* 103* 185*    Recent Results (from the past 240 hour(s))  Culture, blood (Routine X 2) w Reflex to ID Panel     Status: None   Collection Time: 06/12/20 12:41 AM   Specimen: BLOOD  Result Value Ref Range Status   Specimen Description BLOOD BLOOD RIGHT HAND  Final   Special Requests   Final    BOTTLES DRAWN AEROBIC AND ANAEROBIC Blood Culture adequate volume   Culture   Final    NO GROWTH 5 DAYS Performed at [facility], [address] [place], [place] [address]    Report Status 06/17/2020 FINAL  Final  Culture, blood (Routine X 2) w Reflex to ID Panel     Status: None   Collection Time: 06/12/20 12:41 AM   Specimen: BLOOD  Result Value Ref Range Status   Specimen Description BLOOD BLOOD LEFT HAND  Final   Special Requests IN PEDIATRIC BOTTLE Blood Culture adequate volume  Final   Culture   Final    NO GROWTH 5 DAYS Performed at [facility], [address], [place], [place] [address]  Report Status 06/17/2020 FINAL  Final  Aerobic/Anaerobic Culture w Gram Stain (surgical/deep wound)     Status: None (Preliminary result)   Collection Time: 06/17/20  5:52 PM   Specimen: PATH Other; Body Fluid  Result Value Ref Range Status   Specimen Description   Final    WOUND Performed at [facility], [address] [place], [place] [address]    Special Requests   Final    EPIDURAL Performed at [facility], [address] [place], [place] [address]    Gram Stain   Final    ABUNDANT WBC PRESENT, PREDOMINANTLY PMN NO ORGANISMS SEEN    Culture   Final    NO GROWTH 3 DAYS NO ANAEROBES ISOLATED; CULTURE IN PROGRESS FOR 5 DAYS Performed at [facility] [address], [place], [place] [address]    Report Status PENDING  Incomplete  Aerobic/Anaerobic Culture w Gram Stain (surgical/deep wound)     Status: None (Preliminary result)   Collection Time: 06/17/20  5:55 PM   Specimen: PATH Other; Body Fluid  Result Value Ref Range Status   Specimen Description   Final    WOUND Performed at [facility], [address], [place], [place] [address]    Special Requests   Final    THORACIC Performed at [facility], [address] [place], [place] [address]    Gram Stain   Final    RARE WBC PRESENT, PREDOMINANTLY PMN NO ORGANISMS SEEN    Culture   Final    NO GROWTH 3 DAYS NO ANAEROBES ISOLATED; CULTURE IN PROGRESS FOR 5 DAYS Performed at [facility] [address], [place], [place] [address]    Report Status PENDING  Incomplete  Aerobic/Anaerobic Culture (surgical/deep wound)     Status: None (Preliminary result)   Collection Time: 06/19/20 11:10 AM   Specimen: Abscess  Result Value Ref Range Status   Specimen Description   Final    ABSCESS LEFT LUMBAR FLUID/MYOSITIS Performed at [facility], [address], [place], [place] [address]    Special Requests   Final    NONE Performed at [facility], [address] [place], [place] [address]    Gram Stain   Final    MODERATE WBC PRESENT,BOTH PMN AND MONONUCLEAR NO ORGANISMS SEEN    Culture   Final    NO GROWTH < 24 HOURS Performed at [facility], [place] [address], [place], [place] [address]    Report Status PENDING  Incomplete     Scheduled Meds: . chlorhexidine  15 mL Mouth Rinse BID  . Chlorhexidine Gluconate Cloth  6 each Topical Q0600  . diltiazem  120 mg Oral Daily  . enoxaparin (LOVENOX) injection  0.5 mg/kg Subcutaneous Q24H  . gabapentin  200 mg Oral BID  . insulin aspart  0-15 Units Subcutaneous TID WC  . insulin aspart  0-5 Units Subcutaneous QHS  . insulin aspart  6 Units Subcutaneous TID WC  . insulin glargine  36 Units Subcutaneous Daily  . levothyroxine  150 mcg Oral Q0600  . losartan  100 mg Oral Daily  . magnesium oxide  400 mg Oral Daily  . mouth rinse  15 mL Mouth Rinse q12n4p  . pantoprazole  40 mg Oral Daily  . potassium chloride  20 mEq Oral Daily  . sodium chloride flush  10-40 mL Intracatheter Q12H  . thiamine  100 mg Oral Daily  . traZODone  50  mg Oral QHS  . valACYclovir  1,000 mg Oral BID   Continuous Infusions: . sodium chloride Stopped (06/18/20 1437)  . cefTRIAXone (ROCEPHIN)  IV 2 g (06/21/20 0501)     LOS: 12 days   Cherene Altes, MD [facility] Office  [phone] Pager - Text Page per Amion  If 7PM-7AM, please contact night-coverage per Amion 06/21/2020, 9:37 AM

## 2020-06-21 NOTE — Plan of Care (Signed)
  Problem: Education: Goal: Knowledge of General Education information will improve Description: Including pain rating scale, medication(s)/side effects and non-pharmacologic comfort measures 06/21/2020 1622 by Cristela Blue, RN Outcome: Progressing 06/21/2020 1622 by Cristela Blue, RN Outcome: Progressing   Problem: Health Behavior/Discharge Planning: Goal: Ability to manage health-related needs will improve 06/21/2020 1622 by Cristela Blue, RN Outcome: Progressing 06/21/2020 1622 by Cristela Blue, RN Outcome: Progressing   Problem: Clinical Measurements: Goal: Ability to maintain clinical measurements within normal limits will improve 06/21/2020 1622 by Cristela Blue, RN Outcome: Progressing 06/21/2020 1622 by Cristela Blue, RN Outcome: Progressing Goal: Will remain free from infection 06/21/2020 1622 by Cristela Blue, RN Outcome: Progressing 06/21/2020 1622 by Cristela Blue, RN Outcome: Progressing Goal: Diagnostic test results will improve 06/21/2020 1622 by Cristela Blue, RN Outcome: Progressing 06/21/2020 1622 by Cristela Blue, RN Outcome: Progressing Goal: Respiratory complications will improve 06/21/2020 1622 by Cristela Blue, RN Outcome: Progressing 06/21/2020 1622 by Cristela Blue, RN Outcome: Progressing Goal: Cardiovascular complication will be avoided 06/21/2020 1622 by Cristela Blue, RN Outcome: Progressing 06/21/2020 1622 by Cristela Blue, RN Outcome: Progressing   Problem: Activity: Goal: Risk for activity intolerance will decrease 06/21/2020 1622 by Cristela Blue, RN Outcome: Progressing 06/21/2020 1622 by Cristela Blue, RN Outcome: Progressing   Problem: Nutrition: Goal: Adequate nutrition will be maintained 06/21/2020 1622 by Cristela Blue, RN Outcome: Progressing 06/21/2020 1622 by Cristela Blue, RN Outcome: Progressing   Problem: Coping: Goal: Level of anxiety will decrease 06/21/2020 1622 by Cristela Blue, RN Outcome:  Progressing 06/21/2020 1622 by Cristela Blue, RN Outcome: Progressing   Problem: Elimination: Goal: Will not experience complications related to bowel motility 06/21/2020 1622 by Cristela Blue, RN Outcome: Progressing 06/21/2020 1622 by Cristela Blue, RN Outcome: Progressing Goal: Will not experience complications related to urinary retention 06/21/2020 1622 by Cristela Blue, RN Outcome: Progressing 06/21/2020 1622 by Cristela Blue, RN Outcome: Progressing   Problem: Pain Managment: Goal: General experience of comfort will improve 06/21/2020 1622 by Cristela Blue, RN Outcome: Progressing 06/21/2020 1622 by Cristela Blue, RN Outcome: Progressing   Problem: Safety: Goal: Ability to remain free from injury will improve 06/21/2020 1622 by Cristela Blue, RN Outcome: Progressing 06/21/2020 1622 by Cristela Blue, RN Outcome: Progressing   Problem: Skin Integrity: Goal: Risk for impaired skin integrity will decrease 06/21/2020 1622 by Cristela Blue, RN Outcome: Progressing 06/21/2020 1622 by Cristela Blue, RN Outcome: Progressing

## 2020-06-22 DIAGNOSIS — G062 Extradural and subdural abscess, unspecified: Secondary | ICD-10-CM | POA: Diagnosis not present

## 2020-06-22 DIAGNOSIS — G009 Bacterial meningitis, unspecified: Secondary | ICD-10-CM | POA: Diagnosis not present

## 2020-06-22 DIAGNOSIS — M4620 Osteomyelitis of vertebra, site unspecified: Secondary | ICD-10-CM | POA: Diagnosis not present

## 2020-06-22 DIAGNOSIS — R7881 Bacteremia: Secondary | ICD-10-CM | POA: Diagnosis not present

## 2020-06-22 LAB — AEROBIC/ANAEROBIC CULTURE W GRAM STAIN (SURGICAL/DEEP WOUND)
Culture: NO GROWTH
Culture: NO GROWTH

## 2020-06-22 LAB — COMPREHENSIVE METABOLIC PANEL
ALT: 109 U/L — ABNORMAL HIGH (ref 0–44)
AST: 131 U/L — ABNORMAL HIGH (ref 15–41)
Albumin: 1.9 g/dL — ABNORMAL LOW (ref 3.5–5.0)
Alkaline Phosphatase: 237 U/L — ABNORMAL HIGH (ref 38–126)
Anion gap: 7 (ref 5–15)
BUN: 17 mg/dL (ref 6–20)
CO2: 22 mmol/L (ref 22–32)
Calcium: 7.6 mg/dL — ABNORMAL LOW (ref 8.9–10.3)
Chloride: 107 mmol/L (ref 98–111)
Creatinine, Ser: 0.81 mg/dL (ref 0.44–1.00)
GFR, Estimated: >60 mL/min (ref >60)
Glucose, Bld: 215 mg/dL — ABNORMAL HIGH (ref 70–99)
Potassium: 3.4 mmol/L — ABNORMAL LOW (ref 3.5–5.1)
Sodium: 136 mmol/L (ref 135–145)
Total Bilirubin: 0.5 mg/dL (ref 0.3–1.2)
Total Protein: 5.8 g/dL — ABNORMAL LOW (ref 6.5–8.1)

## 2020-06-22 LAB — CBC
HCT: 24.8 % — ABNORMAL LOW (ref 36.0–46.0)
Hemoglobin: 7.7 g/dL — ABNORMAL LOW (ref 12.0–15.0)
MCH: 25.5 pg — ABNORMAL LOW (ref 26.0–34.0)
MCHC: 31 g/dL (ref 30.0–36.0)
MCV: 82.1 fL (ref 80.0–100.0)
Platelets: 466 10*3/uL — ABNORMAL HIGH (ref 150–400)
RBC: 3.02 MIL/uL — ABNORMAL LOW (ref 3.87–5.11)
RDW: 17 % — ABNORMAL HIGH (ref 11.5–15.5)
WBC: 8 10*3/uL (ref 4.0–10.5)
nRBC: 0 % (ref 0.0–0.2)

## 2020-06-22 LAB — GLUCOSE, CAPILLARY
Glucose-Capillary: 212 mg/dL — ABNORMAL HIGH (ref 70–99)
Glucose-Capillary: 215 mg/dL — ABNORMAL HIGH (ref 70–99)
Glucose-Capillary: 257 mg/dL — ABNORMAL HIGH (ref 70–99)
Glucose-Capillary: 249 mg/dL — ABNORMAL HIGH (ref 70–99)

## 2020-06-22 MED ORDER — POTASSIUM CHLORIDE CRYS ER 20 MEQ PO TBCR: 40.0000 meq | EXTENDED_RELEASE_TABLET | Freq: Once | ORAL | Status: DC

## 2020-06-22 MED ORDER — POTASSIUM CHLORIDE 20 MEQ PO PACK
40.0000 meq | PACK | Freq: Every day | ORAL | Status: DC
  Administered 2020-06-22: 10:00:00 40 meq via ORAL
  Filled 2020-06-22: qty 2

## 2020-06-22 NOTE — Progress Notes (Signed)
Tonya Myers  WER:[id number] DOB: 06-22-1965 DOA: 06/09/2020 PCP: [name], MD    Brief Narrative:  55 year old with a history of DM, CLL, and chronic back pain who presented to [facility] with acute onset of AMS and the inability to talk.  MRI of the brain was negative for stroke.  She was found to be in DKA and also suffering with acute kidney injury.  During the course of her hospital stay she became febrile.  A full evaluation ultimately revealed the diagnosis of pneumococcal sepsis and pneumococcal meningitis as confirmed with blood cultures and lumbar puncture.  MRI of the spine revealed a paraspinal abscess as well as an epidural abscess.  Neurosurgery and IR have been consulted and are participating in her care.  Worsening of the patient's peripheral neurologic symptoms led to a repeat MRI of the lumbar spine 2/21 which confirmed an enlarging paraspinal abscess.  Repeat MRI of the cervical and thoracic spine 2/22 noted an enlarging dorsal epidural fluid collection.  Given these findings the patient was taken to the OR by neurosurgery 2/22.  Antimicrobials:  Ceftriaxone 2/15 > Cefepime 2/15 Vancomycin 2/15 > 2/17  DVT prophylaxis: Lovenox   Subjective: Blood pressure has stabilized and at times is even elevated at 195 systolic.  Vitals otherwise stable.  Afebrile.  LFTs improved following volume challenge. Much more alert today. Denies new complaints. Oral intake improved.   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 Patient failed conservative antibiotic therapy - Neurosurgery took the patient to the OR 2/22 for a T3/4 laminectomy, T5-7 laminectomy, T9/10 laminectomy and evacuation of abscess - aspiration of lumbar paraspinal abscess completed by IR 2/24 - abx per ID continue, w/ plan for 6 weeks total tx   Fluctuating mental status - Acute metabolic encephalopathy Neurologically improving otherwise - no other sx to suggest  decompensation - follow closely - discontinued all potentially sedating meds - ammonia level normal  Mild transaminitis Unclear etiology - improved following modest fluid challenge - cont IVF support until intake more consistent   Acute kidney injury Creatinine 1.56 at presentation - resolved w/ supportive care  Severely uncontrolled DM2 with DKA at presentation CBG remains stable within reasonable range - avoid hypoglycemia in setting of intermittent poor intake   Anemia Likely due primarily to severe smoldering infection (anemia panel c/w this) - follow trend - no indication for transfusion at this time  Essential HTN Blood pressure controlled at this time  Obesity - Body mass index is 31.19 kg/m.  CLL  Hyponatremia Resolved  Hypomagnesemia Resolved  Disposition  [name] eval for possible admission    Code Status: FULL CODE Family Communication:  Status is: Inpatient  Remains inpatient appropriate because:Inpatient level of care appropriate due to severity of illness   Dispo: The patient is from: Home              Anticipated d/c is to: unclear              Anticipated d/c date is: > 3 days              Patient currently is not medically stable to d/c.   Difficult to place patient No   Consultants:  Neurosurgery ID Neurology IR  Objective: Blood pressure 128/63, pulse 84, temperature 98.5 F (36.9 C), resp. rate 18, height 6' (1.829 m), weight 104.3 kg, SpO2 90 %.  Intake/Output Summary (Last 24 hours) at 06/22/2020 0904 Last data filed at 06/21/2020 1600 Gross per  24 hour  Intake 725 ml  Output --  Net 725 ml   Filed Weights   06/17/20 1621 06/18/20 0500 06/19/20 0500  Weight: 109.1 kg 104.6 kg 104.3 kg    Examination: General: No acute respiratory distress - much more alert - confused  Lungs: Clear to auscultation bilaterally  Cardiovascular: RRR w/o M  Abdomen: NT/ND, soft, BS positive, no rebound  Extremities: No C/C/E B lower extremities     CBC: Recent Labs  Lab 06/20/20 0600 06/21/20 0532 06/22/20 0601  WBC 12.1* 10.1 8.0  HGB 8.3* 8.1* 7.7*  HCT 27.1* 26.6* 24.8*  MCV 82.9 83.1 82.1  PLT 436* 468* 099*   Basic Metabolic Panel: Recent Labs  Lab 06/16/20 0502 06/17/20 0640 06/18/20 0615 06/19/20 0605 06/20/20 0600 06/21/20 0532 06/22/20 0601  NA 139 137  --  138 141 140 136  K 3.6 3.4*  --  3.6 3.6 4.0 3.4*  CL 109 104  --  104 107 107 107  CO2 23 26  --  24 27 26 22   GLUCOSE 305* 276*  --  343* 153* 140* 215*  BUN 19 15  --  18 18 18 17   CREATININE 0.86 0.93  --  0.83 0.91 0.89 0.81  CALCIUM 8.3* 8.3*  --  7.5* 8.2* 8.1* 7.6*  MG 1.8 1.7 1.8 1.7  --  1.7  --   PHOS 2.8 2.7 3.3  --   --   --   --    GFR: Estimated Creatinine Clearance: 104.8 mL/min (by C-G formula based on SCr of 0.81 mg/dL).  Liver Function Tests: Recent Labs  Lab 06/19/20 0605 06/20/20 0600 06/21/20 0532 06/22/20 0601  AST 42* 91* 198* 131*  ALT 37 57* 116* 109*  ALKPHOS 207* 215* 245* 237*  BILITOT 0.6 0.7 0.9 0.5  PROT 5.7* 5.9* 6.1* 5.8*  ALBUMIN 1.9* 2.0* 2.0* 1.9*    HbA1C: Hgb A1c MFr Bld  Date/Time Value Ref Range Status  06/09/2020 08:37 PM 8.0 (H) 4.8 - 5.6 % Final    Comment:    (NOTE)         Prediabetes: 5.7 - 6.4         Diabetes: >6.4         Glycemic control for adults with diabetes: <7.0     CBG: Recent Labs  Lab 06/20/20 2015 06/21/20 0831 06/21/20 1150 06/21/20 1628 06/21/20 2043  GLUCAP 103* 185* 241* 194* 134*    Recent Results (from the past 240 hour(s))  Aerobic/Anaerobic Culture w Gram Stain (surgical/deep wound)     Status: None (Preliminary result)   Collection Time: 06/17/20  5:52 PM   Specimen: PATH Other; Body Fluid  Result Value Ref Range Status   Specimen Description   Final    WOUND Performed at [facility], [address] [place], [place] [address]    Special Requests   Final    EPIDURAL Performed at [facility], [address]  [place], [place] [address]    Gram Stain   Final    ABUNDANT WBC PRESENT, PREDOMINANTLY PMN NO ORGANISMS SEEN    Culture   Final    NO GROWTH 4 DAYS NO ANAEROBES ISOLATED; CULTURE IN PROGRESS FOR 5 DAYS Performed at [facility] [address] [place], [place] [address]    Report Status PENDING  Incomplete  Aerobic/Anaerobic Culture w Gram Stain (surgical/deep wound)     Status: None (Preliminary result)   Collection Time: 06/17/20  5:55 PM   Specimen: PATH Other; Body Fluid  Result Value Ref Range Status   Specimen Description   Final    WOUND Performed at [facility], [address], [place], [place] [address]    Special Requests   Final    THORACIC Performed at [facility], [address] [place], [place] [address]    Gram Stain   Final    RARE WBC PRESENT, PREDOMINANTLY PMN NO ORGANISMS SEEN    Culture   Final    NO GROWTH 4 DAYS NO ANAEROBES ISOLATED; CULTURE IN PROGRESS FOR 5 DAYS Performed at [facility] [address] [place], [place] [address]    Report Status PENDING  Incomplete  Aerobic/Anaerobic Culture (surgical/deep wound)     Status: None (Preliminary result)   Collection Time: 06/19/20 11:10 AM   Specimen: Abscess  Result Value Ref Range Status   Specimen Description   Final    ABSCESS LEFT LUMBAR FLUID/MYOSITIS Performed at [facility], [address] [place], [place] [address]    Special Requests   Final    NONE Performed at [facility], [address] [place], [place] [address]    Gram Stain   Final    MODERATE WBC PRESENT,BOTH PMN AND MONONUCLEAR NO ORGANISMS SEEN    Culture   Final    NO GROWTH 2 DAYS NO ANAEROBES ISOLATED; CULTURE IN PROGRESS FOR 5 DAYS Performed at [facility], [place] [address], [place], [place] [address]    Report Status PENDING  Incomplete     Scheduled Meds: . chlorhexidine  15 mL Mouth Rinse BID  . Chlorhexidine Gluconate Cloth  6 each Topical Q0600  .  enoxaparin (LOVENOX) injection  0.5 mg/kg Subcutaneous Q24H  . insulin aspart  0-15 Units Subcutaneous TID WC  . insulin aspart  0-5 Units Subcutaneous QHS  . insulin aspart  3 Units Subcutaneous TID WC  . insulin glargine  36 Units Subcutaneous Daily  . levothyroxine  150 mcg Oral Q0600  . mouth rinse  15 mL Mouth Rinse q12n4p  . pantoprazole  40 mg Oral Daily  . potassium chloride  20 mEq Oral Daily  . sodium chloride flush  10-40 mL Intracatheter Q12H  . thiamine  100 mg Oral Daily  . valACYclovir  1,000 mg Oral BID   Continuous Infusions: . sodium chloride 125 mL/hr at 06/21/20 1317  . cefTRIAXone (ROCEPHIN)  IV 2 g (06/22/20 0549)     LOS: 13 days   Cherene Altes, MD [facility] Office  [phone] Pager - Text Page per Amion  If 7PM-7AM, please contact night-coverage per Amion 06/22/2020, 9:04 AM

## 2020-06-22 NOTE — Progress Notes (Signed)
    Attending Progress Note  History: Tara Rud is here for epidural abscess.  POD5: No events overnight. Continues to slowly improve.   POD4: No events overnight. She is stable and well. No significant change.  POD3: No events overnight. She is stable and well.  POD2: No events overnight. Her legs continue to feel better than preop  POD1: Ms. Scronce did well overnight.  She has increased movement and feeling in her legs.    Physical Exam: Vitals:   06/22/20 0616 06/22/20 0855  BP: (!) 162/80 128/63  Pulse: 93 84  Resp: 16 18  Temp: 99.7 F (37.6 C) 98.5 F (36.9 C)  SpO2: 100% 90%    AA Ox3 CNI  Strength:  UE diffusely 4-4+/5 except grip, which is 4- 5/5 throughout BLE except IP which is 4+/5 bilaterally  Incisions c/d/i  Data:  Recent Labs  Lab 06/20/20 0600 06/21/20 0532 06/22/20 0601  NA 141 140 136  K 3.6 4.0 3.4*  CL 107 107 107  CO2 27 26 22   BUN 18 18 17   CREATININE 0.91 0.89 0.81  GLUCOSE 153* 140* 215*  CALCIUM 8.2* 8.1* 7.6*   Recent Labs  Lab 06/22/20 0601  AST 131*  ALT 109*  ALKPHOS 237*     Recent Labs  Lab 06/20/20 0600 06/21/20 0532 06/22/20 0601  WBC 12.1* 10.1 8.0  HGB 8.3* 8.1* 7.7*  HCT 27.1* 26.6* 24.8*  PLT 436* 468* 466*   No results for input(s): APTT, INR in the last 168 hours.       Other tests/results: cultures pending  Drains - dc'd  Assessment/Plan:  Delcia Spitzley is doing well after multilevel laminectomy for washout of epidural abscesses.  She also has cervical stenosis and myelopathy, but this is clinically stable.  - Drains removed.  - Abx per ID. - pain control - DVT prophylaxis ok - PTOT    Meade Maw MD, [facility] Department of Neurosurgery

## 2020-06-22 NOTE — Progress Notes (Signed)
Inpatient Rehab Admissions Coordinator:  Consult received. Would appreciate updated therapy notes s/p IR procedure on 06/19/20.  Will continue to follow.   Gayland Curry, [place], [place] Admissions Coordinator [phone]

## 2020-06-23 DIAGNOSIS — M6008 Infective myositis, other site: Secondary | ICD-10-CM | POA: Diagnosis not present

## 2020-06-23 DIAGNOSIS — G9340 Encephalopathy, unspecified: Secondary | ICD-10-CM | POA: Diagnosis not present

## 2020-06-23 DIAGNOSIS — G062 Extradural and subdural abscess, unspecified: Secondary | ICD-10-CM | POA: Diagnosis not present

## 2020-06-23 DIAGNOSIS — G009 Bacterial meningitis, unspecified: Secondary | ICD-10-CM | POA: Diagnosis not present

## 2020-06-23 DIAGNOSIS — R7881 Bacteremia: Secondary | ICD-10-CM | POA: Diagnosis not present

## 2020-06-23 DIAGNOSIS — M4620 Osteomyelitis of vertebra, site unspecified: Secondary | ICD-10-CM | POA: Diagnosis not present

## 2020-06-23 DIAGNOSIS — G061 Intraspinal abscess and granuloma: Secondary | ICD-10-CM | POA: Diagnosis not present

## 2020-06-23 LAB — COMPREHENSIVE METABOLIC PANEL
ALT: 95 U/L — ABNORMAL HIGH (ref 0–44)
AST: 105 U/L — ABNORMAL HIGH (ref 15–41)
Albumin: 1.6 g/dL — ABNORMAL LOW (ref 3.5–5.0)
Alkaline Phosphatase: 203 U/L — ABNORMAL HIGH (ref 38–126)
Anion gap: 8 (ref 5–15)
BUN: 12 mg/dL (ref 6–20)
CO2: 21 mmol/L — ABNORMAL LOW (ref 22–32)
Calcium: 6.6 mg/dL — ABNORMAL LOW (ref 8.9–10.3)
Chloride: 111 mmol/L (ref 98–111)
Creatinine, Ser: 0.61 mg/dL (ref 0.44–1.00)
GFR, Estimated: >60 mL/min (ref >60)
Glucose, Bld: 98 mg/dL (ref 70–99)
Potassium: 3 mmol/L — ABNORMAL LOW (ref 3.5–5.1)
Sodium: 140 mmol/L (ref 135–145)
Total Bilirubin: 0.5 mg/dL (ref 0.3–1.2)
Total Protein: 4.9 g/dL — ABNORMAL LOW (ref 6.5–8.1)

## 2020-06-23 LAB — MAGNESIUM: Magnesium: 1.3 mg/dL — ABNORMAL LOW (ref 1.7–2.4)

## 2020-06-23 LAB — GLUCOSE, CAPILLARY
Glucose-Capillary: 118 mg/dL — ABNORMAL HIGH (ref 70–99)
Glucose-Capillary: 295 mg/dL — ABNORMAL HIGH (ref 70–99)
Glucose-Capillary: 181 mg/dL — ABNORMAL HIGH (ref 70–99)
Glucose-Capillary: 161 mg/dL — ABNORMAL HIGH (ref 70–99)

## 2020-06-23 MED ORDER — MELATONIN 5 MG PO TABS
5.0000 mg | ORAL_TABLET | Freq: Every day | ORAL | Status: DC
  Administered 2020-06-23 – 2020-06-26 (×4): 5 mg via ORAL
  Filled 2020-06-23 (×5): qty 1

## 2020-06-23 MED ORDER — MAGNESIUM SULFATE 4 GM/100ML IV SOLN
4.0000 g | Freq: Once | INTRAVENOUS | Status: AC
  Administered 2020-06-23: 4 g via INTRAVENOUS
  Filled 2020-06-23: qty 100

## 2020-06-23 MED ORDER — POTASSIUM CHLORIDE 20 MEQ PO PACK
40.0000 meq | PACK | Freq: Two times a day (BID) | ORAL | Status: DC
  Filled 2020-06-23: qty 2

## 2020-06-23 MED ORDER — POTASSIUM CHLORIDE CRYS ER 20 MEQ PO TBCR
40.0000 meq | EXTENDED_RELEASE_TABLET | Freq: Two times a day (BID) | ORAL | Status: DC
  Administered 2020-06-23 – 2020-06-25 (×5): 40 meq via ORAL
  Filled 2020-06-23 (×5): qty 2

## 2020-06-23 NOTE — Progress Notes (Signed)
NEUROLOGY CONSULTATION PROGRESS NOTE   Date of service: June 23, 2020 Patient Name: Tonya Myers MRN:  [id number] DOB:  09-09-1965  Brief HPI  Tonya Myers is a 55 year old.femalewith CLL (in remission) and uncontrolled DM2 who wasadmitted withstrep pneumoniae bacteremia, pneumococcal meningitis andparaspinal abscesses. LP performed on 2/15 confirmed meningitis. Initial MRI of spine with discitis, spetic arthritis and paraspinal abscesses with no cord compression. She was initially improving on ABX, butBLE strength was noted to have worsened on . Repeat MRI of thoracic spine revealed prominent longitudinally extensive epidural abscess posterior to the thoracic spinal cord, with diffuse thoracic spinal cord compression. She was taken to the OR by Neurosurgery on 2/22 for T3/4, T5-7 and T9/10 laminectomiesand evacuation of abscess. Subsequently, IR has drained her paraspinal abscesses. She is followed by ID and is on IV ceftriaxone. Since the epidural abscess drainage, she has been improving neurologically both in terms of limb strength and mentation.    Interval Hx   Awake,alert, conversant. Able to do simple calculations. Feels foggy in her head, no pain.  Vitals   Vitals:   06/22/20 1527 06/22/20 2000 06/23/20 0034 06/23/20 0841  BP: 139/78 135/66 120/67 (!) 155/80  Pulse: (!) 104 100 94 86  Resp: 18 (!) 24 20 18   Temp: 99.3 F (37.4 C) 99.9 F (37.7 C) 98.3 F (36.8 C) 98.6 F (37 C)  TempSrc:      SpO2: 99%   100%  Weight:      Height:         Body mass index is 31.19 kg/m.  Physical Exam   Neurologic Examination  Mental status/Cognition: Alert, oriented to self, place, good attention. Able to do simple calculations. Speech/language: Fluent, comprehension intact, object naming intact. Cranial nerves:   CN II    CN III,IV,VI    CN V normal sensation in V1, V2, and V3 segments bilaterally   CN VII no asymmetry, no nasolabial fold flattening   CN VIII  normal hearing to speech   CN IX & X    CN XI    CN XII midline tongue protrusion   Motor:  Muscle bulk: normal, tone normal, BL upper extremity drift. BL tremors and mild incoordination. Mvmt Root Nerve  Muscle Right Left Comments  SA C5/6 Ax Deltoid     EF C5/6 Mc Biceps 4+ 4+   EE C6/7/8 Rad Triceps 4+ 4+   WF C6/7 Med FCR     WE C7/8 PIN ECU     F Ab C8/T1 U ADM/FDI 4+ 4+   HF L1/2/3 Fem Illopsoas 4+ 4+   KE L2/3/4 Fem Quad     DF L4/5 D Peron Tib Ant     PF S1/2 Tibial Grc/Sol      Reflexes:  Right Left Comments  Pectoralis      Biceps (C5/6) 2 2   Brachioradialis (C5/6) 2 2    Triceps (C6/7) 2 2    Patellar (L3/4) 2 2    Achilles (S1)      Hoffman      Plantar     Jaw jerk    Sensation:  Light touch Mildly decreased in BL feet compared to BL hands.   Pin prick    Temperature    Vibration   Proprioception    Coordination/Complex Motor:  - Finger to Nose with mild ataxia BL. - Heel to shin unable to do. - Rapid alternating movement are slowed. - Gait: Deferred.  Labs   Basic Metabolic Panel:  Lab Results  Component Value Date   NA 140 06/23/2020   K 3.0 (L) 06/23/2020   CO2 21 (L) 06/23/2020   GLUCOSE 98 06/23/2020   BUN 12 06/23/2020   CREATININE 0.61 06/23/2020   CALCIUM 6.6 (L) 06/23/2020   GFRNONAA >60 06/23/2020   GFRAA >60 01/17/2020   HbA1c:  Lab Results  Component Value Date   HGBA1C 8.0 (H) 06/09/2020   LDL: No results found for: [facility] Urine Drug Screen:     Component Value Date/Time   LABOPIA NONE DETECTED 06/09/2020 2145   COCAINSCRNUR NONE DETECTED 06/09/2020 2145   LABBENZ NONE DETECTED 06/09/2020 2145   AMPHETMU NONE DETECTED 06/09/2020 2145   THCU NONE DETECTED 06/09/2020 2145   LABBARB NONE DETECTED 06/09/2020 2145    Alcohol Level     Component Value Date/Time   ETH <10 06/09/2020 2037   No results found for: PHENYTOIN, ZONISAMIDE, LAMOTRIGINE, LEVETIRACETA No results found for: PHENYTOIN, PHENOBARB, VALPROATE,  CBMZ  Imaging and Diagnostic studies   MRI C, T, L spine without contrast:  1. Large dorsal epidural fluid collection spanning the entirety of the thoracic spine, increased in size relative to February 16 MRI. The collection along with degenerative change results in severe canal stenosis at T6-T7 with near complete effacement of CSF and multilevel moderate canal stenosis in the thoracic spine, progressed since the prior and detailed above. While nonspecific by imaging, findings are concerning for epidural abscess given the patient's clinical history. Postcontrast imaging could provide more complete evaluation if clinically indicated. 2. Edema within the paraspinal soft tissues in the upper cervical and thoracic spine and lower thoracic spine, concerning for infection/cellulitis given the clinical history. No discrete drainable fluid collection identified in the paraspinal soft tissues of the cervical or thoracic spine identified, although evaluation is limited by motion and absence of IV contrast. Please see separately dictated lumbar spine for infectious findings in the lumbar spine. 3. Evaluation of degenerative change in the cervical spine is significantly limited given motion. Multilevel suspected at least moderate canal stenosis, worst on the left at C3-C4 where posterior disc osteophyte contacts and deforms the left cord with effacement of the left root entry zone. Evaluation of the foramina is particularly limited, but there is suspected at least moderate multilevel foraminal stenosis in the cervical spine and on the left at T8-T9. Repeat MRI (possibly with sedation) could further characterize if clinically indicated.  Impression   Tonya Myers is a 55 year old.femalewith CLL (in remission) and uncontrolled DM2 who wasadmitted withstrep pneumoniae bacteremia, pneumococcal meningitis andparaspinal abscesses. LP performed on 2/15 confirmed meningitis. Initial MRI of  spine with discitis, septic arthritis and paraspinal abscesses with no cord compression. She was initially improving on ABX, butBLE strength was noted to have worsened on 06/16/20. Repeat MRI of thoracic spine revealed prominent longitudinally extensive epidural abscess posterior to the thoracic spinal cord, with diffuse thoracic spinal cord compression. She was taken to the OR by Neurosurgery on 2/22 for T3/4, T5-7 and T9/10 laminectomiesand evacuation of abscess. Subsequently, IR has drained her paraspinal abscesses. She is followed by ID and is on IV ceftriaxone. Since the epidural abscess drainage, she has been improving neurologically both in terms of limb strength and mentation. Myelopathy appears sable on exam today compared to prior neuro exams.  Recommendations  - Antibiotics dose and duration per ID - Frequent Neuro checks. - She will be an excellent candidate for inpatient rehab. - Neurosurg following post op after multilevel laminectomy. Drains removed per NSGY  note. -  ______________________________________________________________________   Thank you for the opportunity to take part in the care of this patient. If you have any further questions, please contact the neurology consultation attending.  Signed,  [place] Pager Number [phone]

## 2020-06-23 NOTE — Progress Notes (Signed)
Tonya Myers  CXK:[id number] DOB: [birth year] DOA: 06/09/2020 PCP: [name], MD    Brief Narrative:  55 year old with a history of DM, CLL, and chronic back pain who presented to [facility] with acute onset of AMS and the inability to talk.  MRI of the brain was negative for stroke.  She was found to be in DKA and also suffering with acute kidney injury.  During the course of her hospital stay she became febrile.  A full evaluation ultimately revealed the diagnosis of pneumococcal sepsis and pneumococcal meningitis as confirmed with blood cultures and lumbar puncture.  MRI of the spine revealed a paraspinal abscess as well as an epidural abscess.  Neurosurgery and IR have been consulted and are participating in her care.  Worsening of the patient's peripheral neurologic symptoms led to a repeat MRI of the lumbar spine 2/21 which confirmed an enlarging paraspinal abscess.  Repeat MRI of the cervical and thoracic spine 2/22 noted an enlarging dorsal epidural fluid collection.  Given these findings the patient was taken to the OR by neurosurgery 2/22.  Antimicrobials:  Ceftriaxone 2/15 > Cefepime 2/15 Vancomycin 2/15 > 2/17  DVT prophylaxis: Lovenox   Subjective: Afebrile.  Vitals otherwise stable.  More alert today though not fully oriented.  Appears to be making slow but steady progress.  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 Patient failed conservative antibiotic therapy - Neurosurgery took the patient to the OR 2/22 for a T3/4 laminectomy, T5-7 laminectomy, T9/10 laminectomy and evacuation of abscess - aspiration of lumbar paraspinal abscess completed by IR 2/24 - abx per ID continue, w/ plan for 6 weeks total tx   Fluctuating mental status - Acute metabolic encephalopathy Neurologically improving otherwise - no other sx to suggest decompensation - follow closely - discontinued all potentially sedating meds - ammonia level  normal - more alert, but remains confused - monitor   Mild transaminitis Unclear etiology - cont to improve following modest fluid challenge - cont IVF support until intake more consistent   Acute kidney injury Creatinine 1.56 at presentation - resolved w/ supportive care  Severely uncontrolled DM2 with DKA at presentation CBG remains stable within reasonable range - avoid hypoglycemia in setting of intermittent poor intake   Anemia Likely due primarily to severe smoldering infection (anemia panel c/w this) - follow trend - no indication for transfusion at this time  Essential HTN Blood pressure controlled at this time  Obesity - Body mass index is 31.19 kg/m.  CLL  Hyponatremia Resolved  Hypomagnesemia Recurring reflective of total body deficit -supplement further and follow  Hypokalemia Likely due to poor oral intake -supplement and follow  Disposition  [name] eval for possible admission -approaching medical stability and aggressive rehab of critical importance   Code Status: FULL CODE Family Communication:  Status is: Inpatient  Remains inpatient appropriate because:Inpatient level of care appropriate due to severity of illness   Dispo: The patient is from: Home              Anticipated d/c is to: unclear              Anticipated d/c date is: > 3 days              Patient currently is not medically stable to d/c.   Difficult to place patient No   Consultants:  Neurosurgery ID Neurology IR  Objective: Blood pressure 135/72, pulse 98, temperature 97.8 F (36.6 C), resp. rate 18, height 6' (  1.829 m), weight 104.3 kg, SpO2 98 %.  Intake/Output Summary (Last 24 hours) at 06/23/2020 1539 Last data filed at 06/23/2020 1500 Gross per 24 hour  Intake 1412 ml  Output 4350 ml  Net -2938 ml   Filed Weights   06/17/20 1621 06/18/20 0500 06/19/20 0500  Weight: 109.1 kg 104.6 kg 104.3 kg    Examination: General: No acute respiratory distress -alert and  conversant but not fully oriented Lungs: Clear to auscultation bilaterally without wheezing Cardiovascular: RRR w/o M or rub Abdomen: NT/ND, soft, BS positive, no rebound  Extremities: No C/C/E B LE  CBC: Recent Labs  Lab 06/20/20 0600 06/21/20 0532 06/22/20 0601  WBC 12.1* 10.1 8.0  HGB 8.3* 8.1* 7.7*  HCT 27.1* 26.6* 24.8*  MCV 82.9 83.1 82.1  PLT 436* 468* 829*   Basic Metabolic Panel: Recent Labs  Lab 06/17/20 0640 06/18/20 0615 06/19/20 0605 06/20/20 0600 06/21/20 0532 06/22/20 0601 06/23/20 0500 06/23/20 0630  NA 137  --  138   < > 140 136  --  140  K 3.4*  --  3.6   < > 4.0 3.4*  --  3.0*  CL 104  --  104   < > 107 107  --  111  CO2 26  --  24   < > 26 22  --  21*  GLUCOSE 276*  --  343*   < > 140* 215*  --  98  BUN 15  --  18   < > 18 17  --  12  CREATININE 0.93  --  0.83   < > 0.89 0.81  --  0.61  CALCIUM 8.3*  --  7.5*   < > 8.1* 7.6*  --  6.6*  MG 1.7 1.8 1.7  --  1.7  --  1.3*  --   PHOS 2.7 3.3  --   --   --   --   --   --    < > = values in this interval not displayed.   GFR: Estimated Creatinine Clearance: 106.1 mL/min (by C-G formula based on SCr of 0.61 mg/dL).  Liver Function Tests: Recent Labs  Lab 06/20/20 0600 06/21/20 0532 06/22/20 0601 06/23/20 0630  AST 91* 198* 131* 105*  ALT 57* 116* 109* 95*  ALKPHOS 215* 245* 237* 203*  BILITOT 0.7 0.9 0.5 0.5  PROT 5.9* 6.1* 5.8* 4.9*  ALBUMIN 2.0* 2.0* 1.9* 1.6*    HbA1C: Hgb A1c MFr Bld  Date/Time Value Ref Range Status  06/09/2020 08:37 PM 8.0 (H) 4.8 - 5.6 % Final    Comment:    (NOTE)         Prediabetes: 5.7 - 6.4         Diabetes: >6.4         Glycemic control for adults with diabetes: <7.0     CBG: Recent Labs  Lab 06/22/20 1128 06/22/20 1619 06/22/20 2149 06/23/20 0846 06/23/20 1209  GLUCAP 215* 257* 249* 118* 295*    Recent Results (from the past 240 hour(s))  Aerobic/Anaerobic Culture w Gram Stain (surgical/deep wound)     Status: None   Collection Time:  06/17/20  5:52 PM   Specimen: PATH Other; Body Fluid  Result Value Ref Range Status   Specimen Description   Final    WOUND Performed at [facility], [address], [place], [place] [address]    Special Requests   Final    EPIDURAL Performed at [facility],  [place], [place] [address]    Gram Stain   Final    ABUNDANT WBC PRESENT, PREDOMINANTLY PMN NO ORGANISMS SEEN    Culture   Final    No growth aerobically or anaerobically. Performed at [facility], [place] [address], [place], [place] [address]    Report Status 06/22/2020 FINAL  Final  Aerobic/Anaerobic Culture w Gram Stain (surgical/deep wound)     Status: None   Collection Time: 06/17/20  5:55 PM   Specimen: PATH Other; Body Fluid  Result Value Ref Range Status   Specimen Description   Final    WOUND Performed at [facility], [address] [place], [place] [address]    Special Requests   Final    THORACIC Performed at [facility], [address] [place], [place] [address]    Gram Stain   Final    RARE WBC PRESENT, PREDOMINANTLY PMN NO ORGANISMS SEEN    Culture   Final    No growth aerobically or anaerobically. Performed at [facility], [place] [address], [place], [place] [address]    Report Status 06/22/2020 FINAL  Final  Aerobic/Anaerobic Culture (surgical/deep wound)     Status: None (Preliminary result)   Collection Time: 06/19/20 11:10 AM   Specimen: Abscess  Result Value Ref Range Status   Specimen Description   Final    ABSCESS LEFT LUMBAR FLUID/MYOSITIS Performed at [facility], [address] [place], [place] [address]    Special Requests   Final    NONE Performed at [facility], [address] [place], [place] [address]    Gram Stain   Final    MODERATE WBC PRESENT,BOTH PMN AND MONONUCLEAR NO ORGANISMS SEEN    Culture   Final    NO GROWTH 4 DAYS NO ANAEROBES ISOLATED; CULTURE IN PROGRESS FOR 5  DAYS Performed at [place] [address] [place], [place] [address]    Report Status PENDING  Incomplete     Scheduled Meds: . chlorhexidine  15 mL Mouth Rinse BID  . Chlorhexidine Gluconate Cloth  6 each Topical Q0600  . enoxaparin (LOVENOX) injection  0.5 mg/kg Subcutaneous Q24H  . insulin aspart  0-15 Units Subcutaneous TID WC  . insulin aspart  0-5 Units Subcutaneous QHS  . insulin aspart  3 Units Subcutaneous TID WC  . insulin glargine  36 Units Subcutaneous Daily  . levothyroxine  150 mcg Oral Q0600  . mouth rinse  15 mL Mouth Rinse q12n4p  . pantoprazole  40 mg Oral Daily  . potassium chloride  40 mEq Oral BID  . sodium chloride flush  10-40 mL Intracatheter Q12H  . thiamine  100 mg Oral Daily  . valACYclovir  1,000 mg Oral BID   Continuous Infusions: . sodium chloride 10 mL/hr at 06/23/20 1132  . cefTRIAXone (ROCEPHIN)  IV 2 g (06/23/20 0600)     LOS: 14 days   Cherene Altes, MD [facility] Office  [phone] Pager - Text Page per Amion  If 7PM-7AM, please contact night-coverage per Amion 06/23/2020, 3:39 PM

## 2020-06-23 NOTE — Progress Notes (Signed)
Occupational Therapy Treatment Patient Details Name: Tonya Myers MRN: [id number] DOB: 21-Apr-1966 Today's Date: 06/23/2020    History of present illness Pt is a 55 y/o female admitted from home on 06/09/20 after presenting with AMS. Pt being treated for acute encephalopathy. 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 Pt also found to have paraspinal abscess, small epidural abscess, septic arthritis L2-L3 and L3-L4 with Neurosurgery & IR recommended serial imaging. Now s/p T3-4, T5-7, T9-10 laminectomy and evacuation of abscess (06/17/20); pending IR drainage of paraspinal abscess later this date (06/19/20).  PMH: hypothyroidism, DDD, cervical radiculopathy, CLL, DM, HTN, HLD, anxiety, arthritis, CA, chronic back pain, asthma, depression, vertigo.   OT comments  Co-tx for session, 2 units for PT, 1 unit for OT. Tonya Myers presents today with generalized weakness and reduced endurance, however, with much more alertness and ability to participate in rehab session than she displayed last week. Required Mod A +2 to move supine<sit while maintaining back precautions. Pt able to sit EOB with Mod A to maintain upright balance for just under 2 minutes, before stating that "I feel like the bottom is dropping out under me," and requested to lie back in bed, for which she required Max A+ 2, with total A to raise LE back into bed. Pt is able to log roll in bed for linen change, with Mod A from therapists. Endorses 9/10 pain in neck/back with movement. Pt expresses her eagerness to engage with rehab process and to be able to return home ASAP, stating that she is missing her 4 dogs very much. Recommend [name] post D/C.    Follow Up Recommendations  [name]    Equipment Recommendations       Recommendations for Other Services      Precautions / Restrictions Precautions Precautions: Fall;Back Restrictions Weight Bearing Restrictions: No       Mobility Bed Mobility Overal bed mobility: Needs  Assistance Bed Mobility: Rolling Rolling: Mod assist   Supine to sit: Mod assist;+2 for physical assistance Sit to supine: Max assist;Mod assist;+2 for physical assistance   General bed mobility comments: able to reach cross body, grab opposite-side bed rails, to assist with rolling in bed    Transfers                 General transfer comment: did not attempt OOB activity    Balance Overall balance assessment: Needs assistance Sitting-balance support: No upper extremity supported;Feet supported Sitting balance-Leahy Scale: Fair Sitting balance - Comments: able to maintain sitting balance with supervision today.  unsafe to be left unattended.       Standing balance comment: unsafe/unable                           ADL either performed or assessed with clinical judgement   ADL Overall ADL's : Needs assistance/impaired                 Upper Body Dressing : Moderate assistance;Bed level;Cueing for sequencing Upper Body Dressing Details (indicate cue type and reason): Pt able to assist with threading arms through sleeves Lower Body Dressing: Maximal assistance;Bed level Lower Body Dressing Details (indicate cue type and reason): donning socks     Toileting- Clothing Manipulation and Hygiene: Maximal assistance;Bed level;+2 for physical assistance               Vision   Additional Comments: better eye contact, tracking today, relative to last week   Perception  Praxis      Cognition Arousal/Alertness: Lethargic Behavior During Therapy: Flat affect Overall Cognitive Status: Impaired/Different from baseline                                 General Comments: much more engaged/aware than last week        Exercises Total Joint Exercises Ankle Circles/Pumps: Strengthening;Both;5 reps;10 reps (with resistance) Quad Sets: Strengthening;Both;5 reps;10 reps Gluteal Sets: Strengthening;Both;5 reps;10 reps Heel Slides:  AAROM;Both;10 reps;Strengthening Hip ABduction/ADduction: AAROM;Strengthening;Both;10 reps Straight Leg Raises: AAROM;Strengthening;Both;10 reps   Shoulder Instructions       General Comments      Pertinent Vitals/ Pain       Pain Assessment: Faces Pain Score: 9  Faces Pain Scale: Hurts little more Pain Location: back, neck Pain Descriptors / Indicators: Grimacing Pain Intervention(s): Repositioned;Limited activity within patient's tolerance;Monitored during session  Home Living                                          Prior Functioning/Environment              Frequency  Min 3X/week        Progress Toward Goals  OT Goals(current goals can now be found in the care plan section)  Progress towards OT goals: Progressing toward goals  Acute Rehab OT Goals Patient Stated Goal: to get better Time For Goal Achievement: 06/29/20 Potential to Achieve Goals: Good  Plan Discharge plan remains appropriate;Frequency remains appropriate    Co-evaluation    PT/OT/SLP Co-Evaluation/Treatment: Yes Reason for Co-Treatment: For patient/therapist safety;Complexity of the patient's impairments (multi-system involvement);To address functional/ADL transfers PT goals addressed during session: Mobility/safety with mobility;Balance;Strengthening/ROM OT goals addressed during session: ADL's and self-care;Strengthening/ROM      AM-PAC OT "6 Clicks" Daily Activity     Outcome Measure   Help from another person eating meals?: A Little Help from another person taking care of personal grooming?: A Lot Help from another person toileting, which includes using toliet, bedpan, or urinal?: A Lot Help from another person bathing (including washing, rinsing, drying)?: A Lot Help from another person to put on and taking off regular upper body clothing?: A Lot Help from another person to put on and taking off regular lower body clothing?: A Lot 6 Click Score: 13    End of  Session    OT Visit Diagnosis: Unsteadiness on feet (R26.81);Muscle weakness (generalized) (M62.81)   Activity Tolerance Treatment limited secondary to medical complications (Comment) (pt became dizzy, asked to return to supine, after sitting EOB ~ 90 seconds)   Patient Left in bed;with call bell/phone within reach;with bed alarm set   Nurse Communication          Time: [id number] OT Time Calculation (min): 29 min  Charges: OT General Charges $OT Visit: 1 Visit OT Treatments $Self Care/Home Management : 8-22 mins  Josiah Lobo, PhD, [place], OTR/L ascom [phone] 06/23/20, 2:30 PM

## 2020-06-23 NOTE — Progress Notes (Signed)
Physical Therapy Treatment Patient Details Name: Tonya Myers MRN: [id number] DOB: [birth year] Today's Date: 06/23/2020    History of Present Illness Pt is a 55 y/o female admitted from home on 06/09/20 after presenting with AMS. Pt being treated for acute encephalopathy. 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 Pt also found to have paraspinal abscess, small epidural abscess, septic arthritis L2-L3 and L3-L4 with Neurosurgery & IR recommended serial imaging. Now s/p T3-4, T5-7, T9-10 laminectomy and evacuation of abscess (06/17/20); pending IR drainage of paraspinal abscess later this date (06/19/20).  PMH: hypothyroidism, DDD, cervical radiculopathy, CLL, DM, HTN, HLD, anxiety, arthritis, CA, chronic back pain, asthma, depression, vertigo.    PT Comments    Co -tx for part of session.  11:07-11:16  PT only, OT in 11:16-11:45.  2 units billed PT, 1 unit OT per protocol.  Pt ready for session.  Participated in exercises as described below.  Pt remains with global weakness needing assist for all exercises.  OT in for mobility.  Rolling to right with mod a x 1.  HOB used to assist pt to sitting position and protect back.  She requires mod a x 2 to get completely upright.  Once sitting, she is able to maintain her balance today with close supervision.  She is unsafe to be left unattended.  After sitting a few minutes she asks quickly to lay back down.  She initially denied dizziness but then stated "The bottom is getting ready to fall out." She is assisted back to supine with max a x 2 and after obtaining Dynamap BP 140/85 P 103.  She would be well served next session to take orthostatic vitals during mobility.  After several minutes and visit from Neurologist she feels better.  Purewick leaked and full bed change is necessary.  She does assist with rolling left/right reaching for rails but needs mod a x 1 to complete roll left and right for linen change.  Positioned for comfort at end of  session.  [name] remains appropriate for discharge.  She remains motivated for sessions and to return home to her family which she stated can provide support.     Follow Up Recommendations  [name]     Equipment Recommendations       Recommendations for Other Services       Precautions / Restrictions Precautions Precautions: Fall;Back Restrictions Weight Bearing Restrictions: No    Mobility  Bed Mobility   Bed Mobility: Rolling Rolling: Mod assist   Supine to sit: Mod assist;+2 for physical assistance Sit to supine: Max assist;Mod assist;+2 for physical assistance   General bed mobility comments: good effort rolling today    Transfers                    Ambulation/Gait                 Stairs             Wheelchair Mobility    Modified Rankin (Stroke Patients Only)       Balance Overall balance assessment: Needs assistance Sitting-balance support: No upper extremity supported;Feet supported Sitting balance-Leahy Scale: Fair Sitting balance - Comments: able to maintain sitting balance with supervision today.  unsafe to be left unattended.       Standing balance comment: unsafe/unable                            Cognition Arousal/Alertness: Lethargic Behavior  During Therapy: Flat affect Overall Cognitive Status: Impaired/Different from baseline                                        Exercises Total Joint Exercises Ankle Circles/Pumps: Strengthening;Both;5 reps;10 reps (with resistance) Quad Sets: Strengthening;Both;5 reps;10 reps Gluteal Sets: Strengthening;Both;5 reps;10 reps Heel Slides: AAROM;Both;10 reps;Strengthening Hip ABduction/ADduction: AAROM;Strengthening;Both;10 reps Straight Leg Raises: AAROM;Strengthening;Both;10 reps    General Comments        Pertinent Vitals/Pain Pain Assessment: Faces Faces Pain Scale: Hurts little more Pain Location: back, neck Pain Descriptors / Indicators:  Discomfort Pain Intervention(s): Repositioned    Home Living                      Prior Function            PT Goals (current goals can now be found in the care plan section) Progress towards PT goals: Progressing toward goals    Frequency    Min 2X/week      PT Plan      Co-evaluation PT/OT/SLP Co-Evaluation/Treatment: Yes Reason for Co-Treatment: For patient/therapist safety;Complexity of the patient's impairments (multi-system involvement);To address functional/ADL transfers PT goals addressed during session: Mobility/safety with mobility;Balance;Strengthening/ROM OT goals addressed during session: ADL's and self-care;Strengthening/ROM      AM-PAC PT "6 Clicks" Mobility   Outcome Measure  Help needed turning from your back to your side while in a flat bed without using bedrails?: A Lot Help needed moving from lying on your back to sitting on the side of a flat bed without using bedrails?: A Lot Help needed moving to and from a bed to a chair (including a wheelchair)?: Total Help needed standing up from a chair using your arms (e.g., wheelchair or bedside chair)?: Total Help needed to walk in hospital room?: Total Help needed climbing 3-5 steps with a railing? : Total 6 Click Score: 8    End of Session   Activity Tolerance: Patient tolerated treatment well Patient left: in bed;with call bell/phone within reach;with bed alarm set Nurse Communication: Mobility status PT Visit Diagnosis: Unsteadiness on feet (R26.81);Difficulty in walking, not elsewhere classified (R26.2);Muscle weakness (generalized) (M62.81);Other symptoms and signs involving the nervous system (R29.898)     Time: 0263-7858 PT Time Calculation (min) (ACUTE ONLY): 38 min  Charges:  $Therapeutic Exercise: 23-37 mins $Therapeutic Activity: 8-22 mins                    Chesley Noon, PTA 06/23/20, 12:45 PM

## 2020-06-23 NOTE — Progress Notes (Signed)
Date of Admission:  06/09/2020     ID: Tonya Myers is a 55 y.o. female  Principal Problem:   Acute encephalopathy Active Problems:   CLL (chronic lymphocytic leukemia) (HCC)   Hypothyroidism   Essential hypertension   Uncontrolled type 2 diabetes mellitus with hyperglycemia, without long-term current use of insulin (HCC)   Aphasia   Hyperglycemic crisis in diabetes mellitus ([place])   Acute metabolic encephalopathy   Severe sepsis (HCC)   Pneumococcal meningitis   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HCC)   Epidural abscess   Hypernatremia   Hypomagnesemia   Obesity (BMI 30.0-34.9)    Subjective: Patient is awake and alert Says she wants to go home rather than to SNF She worked with PT today she said Still not ambulant  Medications:  . chlorhexidine  15 mL Mouth Rinse BID  . Chlorhexidine Gluconate Cloth  6 each Topical Q0600  . enoxaparin (LOVENOX) injection  0.5 mg/kg Subcutaneous Q24H  . insulin aspart  0-15 Units Subcutaneous TID WC  . insulin aspart  0-5 Units Subcutaneous QHS  . insulin aspart  3 Units Subcutaneous TID WC  . insulin glargine  36 Units Subcutaneous Daily  . levothyroxine  150 mcg Oral Q0600  . mouth rinse  15 mL Mouth Rinse q12n4p  . pantoprazole  40 mg Oral Daily  . potassium chloride  40 mEq Oral BID  . sodium chloride flush  10-40 mL Intracatheter Q12H  . thiamine  100 mg Oral Daily  . valACYclovir  1,000 mg Oral BID    Objective: Vital signs in last 24 hours: Temp:  [98.3 F (36.8 C)-99.9 F (37.7 C)] 98.6 F (37 C) (02/28 0841) Pulse Rate:  [86-104] 86 (02/28 0841) Resp:  [18-24] 18 (02/28 0841) BP: (120-155)/(66-80) 155/80 (02/28 0841) SpO2:  [99 %-100 %] 100 % (02/28 0841)  PHYSICAL EXAM:  General: Alert, cooperative, no distress, appears stated age.  Head: Normocephalic, without obvious abnormality, atraumatic.  Lungs: Clear to auscultation bilaterally. No Wheezing or Rhonchi. No rales. Heart: Regular rate and rhythm, no murmur, rub or  gallop. Abdomen: Soft, non-tender,not distended. Bowel sounds normal. No masses Extremities: atraumatic, no cyanosis. No edema. No clubbing Neurologic: Moves all 4 extremities feet.  Weakness persist all over  Lab Results Recent Labs    06/21/20 0532 06/22/20 0601 06/23/20 0630  WBC 10.1 8.0  --   HGB 8.1* 7.7*  --   HCT 26.6* 24.8*  --   NA 140 136 140  K 4.0 3.4* 3.0*  CL 107 107 111  CO2 26 22 21*  BUN 18 17 12   CREATININE 0.89 0.81 0.61   Liver Panel Recent Labs    06/22/20 0601 06/23/20 0630  PROT 5.8* 4.9*  ALBUMIN 1.9* 1.6*  AST 131* 105*  ALT 109* 95*  ALKPHOS 237* 203*  BILITOT 0.5 0.5   Sedimentation Rate No results for input(s): ESRSEDRATE in the last 72 hours. C-Reactive Protein No results for input(s): CRP in the last 72 hours.  Microbiology:  Studies/Results: No results found.   Assessment/Plan: Strep pneumo meningiits Strep pneumo bacteremia Extensive thoracic epidural abscess-underwent multilevel laminectomies and drainage of abscess.  She had 3 drains which have all been removed.   Lumbar paraspinal abscess which has been drained as well. She is currently on ceftriaxone 2 g IV every 12 hours.  Will need for another 4 weeks.  07/24/2020  Abnormal lfts- AST/ALT/alkphos  AKI resolved  Diabetes mellitus on Lantus  Hypothyroidism on Synthroid  CLL in remission  Anemia  Discussed the management with the patient and her partner at bedside.

## 2020-06-23 NOTE — Progress Notes (Signed)
Inpatient Rehabilitation Admissions Coordinator  I met at bedside with patient for rehab assessment. Patient falling asleep mid sentence with me. Noted patient EOB today with therapy. Not yet at a level to tolerate the intensity required of an inpt rehab admit. I will discus with Dr. Ranell Patrick and follow up with team tomorrow with our rehab recommendations.   Danne Baxter, RN, MSN Rehab Admissions Coordinator [phone] 06/23/2020 3:50 PM

## 2020-06-24 DIAGNOSIS — G9340 Encephalopathy, unspecified: Secondary | ICD-10-CM | POA: Diagnosis not present

## 2020-06-24 DIAGNOSIS — M4620 Osteomyelitis of vertebra, site unspecified: Secondary | ICD-10-CM | POA: Diagnosis not present

## 2020-06-24 DIAGNOSIS — G062 Extradural and subdural abscess, unspecified: Secondary | ICD-10-CM | POA: Diagnosis not present

## 2020-06-24 DIAGNOSIS — R7881 Bacteremia: Secondary | ICD-10-CM | POA: Diagnosis not present

## 2020-06-24 DIAGNOSIS — B953 Streptococcus pneumoniae as the cause of diseases classified elsewhere: Secondary | ICD-10-CM | POA: Diagnosis not present

## 2020-06-24 DIAGNOSIS — G009 Bacterial meningitis, unspecified: Secondary | ICD-10-CM | POA: Diagnosis not present

## 2020-06-24 LAB — CBC
HCT: 25 % — ABNORMAL LOW (ref 36.0–46.0)
Hemoglobin: 8 g/dL — ABNORMAL LOW (ref 12.0–15.0)
MCH: 25.9 pg — ABNORMAL LOW (ref 26.0–34.0)
MCHC: 32 g/dL (ref 30.0–36.0)
MCV: 80.9 fL (ref 80.0–100.0)
Platelets: 498 10*3/uL — ABNORMAL HIGH (ref 150–400)
RBC: 3.09 MIL/uL — ABNORMAL LOW (ref 3.87–5.11)
RDW: 16.8 % — ABNORMAL HIGH (ref 11.5–15.5)
WBC: 7 10*3/uL (ref 4.0–10.5)
nRBC: 0 % (ref 0.0–0.2)

## 2020-06-24 LAB — BASIC METABOLIC PANEL
Anion gap: 7 (ref 5–15)
BUN: 10 mg/dL (ref 6–20)
CO2: 24 mmol/L (ref 22–32)
Calcium: 7.8 mg/dL — ABNORMAL LOW (ref 8.9–10.3)
Chloride: 105 mmol/L (ref 98–111)
Creatinine, Ser: 0.57 mg/dL (ref 0.44–1.00)
GFR, Estimated: >60 mL/min (ref >60)
Glucose, Bld: 194 mg/dL — ABNORMAL HIGH (ref 70–99)
Potassium: 4 mmol/L (ref 3.5–5.1)
Sodium: 136 mmol/L (ref 135–145)

## 2020-06-24 LAB — AEROBIC/ANAEROBIC CULTURE W GRAM STAIN (SURGICAL/DEEP WOUND): Culture: NO GROWTH

## 2020-06-24 LAB — GLUCOSE, CAPILLARY
Glucose-Capillary: 205 mg/dL — ABNORMAL HIGH (ref 70–99)
Glucose-Capillary: 228 mg/dL — ABNORMAL HIGH (ref 70–99)
Glucose-Capillary: 210 mg/dL — ABNORMAL HIGH (ref 70–99)
Glucose-Capillary: 185 mg/dL — ABNORMAL HIGH (ref 70–99)

## 2020-06-24 LAB — PHOSPHORUS: Phosphorus: 2.2 mg/dL — ABNORMAL LOW (ref 2.5–4.6)

## 2020-06-24 LAB — MAGNESIUM: Magnesium: 1.8 mg/dL (ref 1.7–2.4)

## 2020-06-24 MED ORDER — INSULIN ASPART 100 UNIT/ML ~~LOC~~ SOLN
5.0000 [IU] | Freq: Three times a day (TID) | SUBCUTANEOUS | Status: DC
  Administered 2020-06-24 – 2020-06-27 (×8): 5 [IU] via SUBCUTANEOUS
  Filled 2020-06-24 (×7): qty 1

## 2020-06-24 NOTE — Progress Notes (Signed)
Physical Therapy Treatment Patient Details Name: Tonya Myers MRN: [id number] DOB: 05/07/1965 Today's Date: 06/24/2020    History of Present Illness Pt is a 55 y/o female admitted from home on 06/09/20 after presenting with AMS. Pt being treated for acute encephalopathy. 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 Pt also found to have paraspinal abscess, small epidural abscess, septic arthritis L2-L3 and L3-L4 with Neurosurgery & IR recommended serial imaging. Now s/p T3-4, T5-7, T9-10 laminectomy and evacuation of abscess (06/17/20); s/p IR drainage of paraspinal abscess later this date (06/19/20).  PMH: hypothyroidism, DDD, cervical radiculopathy, CLL, DM, HTN, HLD, anxiety, arthritis, CA, chronic back pain, asthma, depression, vertigo.    PT Comments    Just finishing breakfast upon arrival to room; staff at bedside for assist and clean up as needed. Agreeable to and eager for session, but does require frequent verbal cuing for task initiation and sequencing throughout session.  Generally distracted by both internal and external environment this date. Able to complete OOB to chair, requiring max assist +2 over level surfaces.  Extensive cuing for UE placement, trunk lean and dynamic balance, lift off and lateral movement. Patient with good intentions to actively assist with transfer, but unable to functionally problem solve without hand-over-hand assist.  Poor motor planning bilat LEs; limited active co-contraction palpable in closed-chain position despite active movement noted.  Nursing at bedside to complete ADL with patient up in chair; recommend use of hoyer lift for return to bed with nursing when ready.  Sling placed in room; lift plugged in and available on the unit.    Follow Up Recommendations  [name]     Equipment Recommendations  Rolling walker with 5" wheels;3in1 (PT)    Recommendations for Other Services       Precautions / Restrictions  Precautions Precautions: Fall;Back Restrictions Weight Bearing Restrictions: No    Mobility  Bed Mobility Overal bed mobility: Needs Assistance Bed Mobility: Supine to Sit;Sit to Supine Rolling: Min assist   Supine to sit: Max assist;+2 for physical assistance Sit to supine: Max assist;+2 for physical assistance        Transfers Overall transfer level: Needs assistance   Transfers: Lateral/Scoot Transfers          Lateral/Scoot Transfers: Max assist;+2 physical assistance General transfer comment: level surface transfer, max assist for hand placement, forward trunk lean and dynamic sitting balance, lift off and lateral movement.  Constant, step-by-step cuing for active effort with task.  Ambulation/Gait             General Gait Details: unsafe/unable   Stairs             Wheelchair Mobility    Modified Rankin (Stroke Patients Only)       Balance Overall balance assessment: Needs assistance Sitting-balance support: No upper extremity supported;Feet supported Sitting balance-Leahy Scale: Fair Sitting balance - Comments: maintains static sitting with close sup once upright today; very forward head, rounded shoulder posturing.  Limited functional reach, limited tolerance for movement outside immediate BOS       Standing balance comment: unsafe/unable                            Cognition Arousal/Alertness: Awake/alert Behavior During Therapy: Flat affect                                   General Comments: difficulty  attending to task, highly distractible by both internal and external environments; difficulty with task initiation and sequencing      Exercises Other Exercises Other Exercises: Supine/sit x2, max assist +2 for each attempt.  Generally rigid and guarded throughout neck and trunk. Wants to attempt to "do it myself", but significant difficulty attending to and initiating task. Other Exercises: Rolling bilat, min  assist, actively assisting with UE/LEs as needed; step by step cuing and encouragement throughout.    General Comments        Pertinent Vitals/Pain Pain Assessment: Faces Faces Pain Scale: Hurts little more Pain Location: back, neck Pain Descriptors / Indicators: Grimacing Pain Intervention(s): Limited activity within patient's tolerance;Monitored during session;Repositioned    Home Living                      Prior Function            PT Goals (current goals can now be found in the care plan section) Acute Rehab PT Goals Patient Stated Goal: to get better PT Goal Formulation: With patient/family Time For Goal Achievement: 07/03/20 Potential to Achieve Goals: Good Progress towards PT goals: Progressing toward goals    Frequency    Min 2X/week      PT Plan Current plan remains appropriate    Co-evaluation              AM-PAC PT "6 Clicks" Mobility   Outcome Measure  Help needed turning from your back to your side while in a flat bed without using bedrails?: A Little Help needed moving from lying on your back to sitting on the side of a flat bed without using bedrails?: A Lot Help needed moving to and from a bed to a chair (including a wheelchair)?: Total Help needed standing up from a chair using your arms (e.g., wheelchair or bedside chair)?: Total Help needed to walk in hospital room?: Total Help needed climbing 3-5 steps with a railing? : Total 6 Click Score: 9    End of Session Equipment Utilized During Treatment: Gait belt Activity Tolerance: Patient tolerated treatment well Patient left: in chair;with call bell/phone within reach;with chair alarm set Nurse Communication: Mobility status PT Visit Diagnosis: Unsteadiness on feet (R26.81);Difficulty in walking, not elsewhere classified (R26.2);Muscle weakness (generalized) (M62.81);Other symptoms and signs involving the nervous system (R29.898)     Time: 1022-1050 PT Time Calculation (min)  (ACUTE ONLY): 28 min  Charges:  $Therapeutic Activity: 23-37 mins                     Kristen H. Owens Shark, PT, DPT, NCS 06/24/20, 1:47 PM [phone]

## 2020-06-24 NOTE — Plan of Care (Signed)
End of Shift Summary:  Alert and oriented x3, reorientation required at times. Pt with some delusions this morning - believes IV pump "ticking" is "a detonator that [we] have implanted inside her mind." Remained on room air overnight, sats >94%. Pain managed with prn medications. Pt verbalizes lack of sleep despite adding prn Melatonin. Endorses numbness in her left foot, repositioned and elevated. Refused several Q2T overnight. UOP adequate via Purewick. PICC dressing changed. Remained free from falls or injury. Bed low and in locked position, bed alarm on. Call bell within reach and encouraged to use.    Problem: Education: Goal: Knowledge of General Education information will improve Description: Including pain rating scale, medication(s)/side effects and non-pharmacologic comfort measures Outcome: Progressing   Problem: Health Behavior/Discharge Planning: Goal: Ability to manage health-related needs will improve Outcome: Progressing   Problem: Clinical Measurements: Goal: Ability to maintain clinical measurements within normal limits will improve Outcome: Progressing Goal: Will remain free from infection Outcome: Progressing Goal: Diagnostic test results will improve Outcome: Progressing Goal: Respiratory complications will improve Outcome: Progressing Goal: Cardiovascular complication will be avoided Outcome: Progressing   Problem: Activity: Goal: Risk for activity intolerance will decrease Outcome: Progressing   Problem: Nutrition: Goal: Adequate nutrition will be maintained Outcome: Progressing   Problem: Coping: Goal: Level of anxiety will decrease Outcome: Progressing   Problem: Elimination: Goal: Will not experience complications related to bowel motility Outcome: Progressing Goal: Will not experience complications related to urinary retention Outcome: Progressing   Problem: Skin Integrity: Goal: Risk for impaired skin integrity will decrease Outcome:  Progressing

## 2020-06-24 NOTE — TOC Progression Note (Addendum)
Transition of Care [facility]) - Progression Note    Patient Details  Name: Tonya Myers MRN: [id number] Date of Birth: Dec 23, 1965  Transition [facility]) [name] Contact  Beverly Sessions, RN Phone Number: 06/24/2020, 3:13 PM  Clinical Narrative:    Wandra Feinstein inpatient rehab at [facility].  Left message a [phone] requesting call back in order to fax referral for review.  Received return call from [facility].  Clinical faxed to [phone]   -Message left for Barnetta Chapel at [place] center [phone] requesting call back in order to fax referral for review.  Received return call Barnetta Chapel.  She request I reach out to [facility] [phone]. Clinical faxed to [phone]  - Message left for Estill Bamberg at [facility] in patient rehab equesting call back in order to fax referral for review [phone].  Received return call.  Faxed to [phone]  - Clinical faxed to [facility] at [phone]  Expected Discharge Plan: [place] Barriers to Discharge: Continued Medical Work up  Expected Discharge Plan and Services Expected Discharge Plan: [place]   Discharge Planning Services: CM Consult Post Acute Care Choice: IP Rehab                                         Social Determinants of Health (SDOH) Interventions    Readmission Risk Interventions No flowsheet data found.

## 2020-06-24 NOTE — Progress Notes (Signed)
    Attending Progress Note  History: Tonya Myers is here for epidural abscess.  POD#7 Thoracic Laminectomy for Epidural Abscess  Tonya Myers continues to do well with lower extremities. Working with PT.     POD5: No events overnight. Continues to slowly improve.  POD4: No events overnight. She is stable and well. No significant change. POD3: No events overnight. She is stable and well. POD2: No events overnight. Her legs continue to feel better than preop POD1: Tonya. Myers did well overnight.  She has increased movement and feeling in her legs.    Physical Exam: Vitals:   06/24/20 0759 06/24/20 1207  BP: 134/88 132/76  Pulse: 97 98  Resp: 17 18  Temp: 98.3 F (36.8 C) 98.4 F (36.9 C)  SpO2:  100%     Strength: UE diffusely 4-4+/5 except grip, which is 4- 5/5 throughout BLE except IP which is 4+/5 bilaterally  Incisions c/d/i, all drains removed  Data:  Recent Labs  Lab 06/22/20 0601 06/23/20 0630 06/24/20 0613  NA 136 140 136  K 3.4* 3.0* 4.0  CL 107 111 105  CO2 22 21* 24  BUN 17 12 10   CREATININE 0.81 0.61 0.57  GLUCOSE 215* 98 194*  CALCIUM 7.6* 6.6* 7.8*   Recent Labs  Lab 06/23/20 0630  AST 105*  ALT 95*  ALKPHOS 203*     Recent Labs  Lab 06/21/20 0532 06/22/20 0601 06/24/20 0613  WBC 10.1 8.0 7.0  HGB 8.1* 7.7* 8.0*  HCT 26.6* 24.8* 25.0*  PLT 468* 466* 498*   No results for input(s): APTT, INR in the last 168 hours.       Other tests/results: cultures from thoracic spine with WBCs, no organisms  Drains - dc'd  Assessment/Plan:  Tonya Myers is doing well after multilevel laminectomy for washout of epidural abscesses.  She also has cervical stenosis and myelopathy, but this is clinically stable.  - Drains removed.  - Abx per ID. - pain control - DVT prophylaxis ok - PTOT  - Suture removal needed 07/01/2020    Tonya Perla, MD Department of Neurosurgery

## 2020-06-24 NOTE — Progress Notes (Signed)
Date of Admission:  06/09/2020  day 15 of antibiotic    ID: Tonya Myers is a 55 y.o. female  Principal Problem:   Acute encephalopathy Active Problems:   CLL (chronic lymphocytic leukemia) (HCC)   Hypothyroidism   Essential hypertension   Uncontrolled type 2 diabetes mellitus with hyperglycemia, without long-term current use of insulin (HCC)   Aphasia   Hyperglycemic crisis in diabetes mellitus ([place])   Acute metabolic encephalopathy   Severe sepsis (HCC)   Pneumococcal meningitis   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HCC)   Epidural abscess   Hypernatremia   Hypomagnesemia   Obesity (BMI 30.0-34.9)    Subjective: Patient feeling better No headache No fever   Medications:  . chlorhexidine  15 mL Mouth Rinse BID  . Chlorhexidine Gluconate Cloth  6 each Topical Q0600  . enoxaparin (LOVENOX) injection  0.5 mg/kg Subcutaneous Q24H  . insulin aspart  0-15 Units Subcutaneous TID WC  . insulin aspart  0-5 Units Subcutaneous QHS  . insulin aspart  3 Units Subcutaneous TID WC  . insulin glargine  36 Units Subcutaneous Daily  . levothyroxine  150 mcg Oral Q0600  . mouth rinse  15 mL Mouth Rinse q12n4p  . melatonin  5 mg Oral QHS  . pantoprazole  40 mg Oral Daily  . potassium chloride  40 mEq Oral BID  . sodium chloride flush  10-40 mL Intracatheter Q12H  . thiamine  100 mg Oral Daily  . valACYclovir  1,000 mg Oral BID    Objective: Vital signs in last 24 hours: Patient Vitals for the past 24 hrs:  BP Temp Temp src Pulse Resp SpO2  06/24/20 1207 132/76 98.4 F (36.9 C) Oral 98 18 100 %  06/24/20 0759 134/88 98.3 F (36.8 C) Oral 97 17 -  06/24/20 0530 130/84 99.1 F (37.3 C) Oral 97 - -  06/23/20 2328 138/79 98.3 F (36.8 C) - 93 18 97 %  06/23/20 1937 (!) 144/85 98.1 F (36.7 C) - 99 17 97 %  06/23/20 1559 (!) 146/66 97.8 F (36.6 C) - 97 18 93 %   PHYSICAL EXAM:  General: Alert, cooperative, no distress, mild confusion at times Head: Normocephalic, without obvious  abnormality, atraumatic. Eyes: Conjunctivae clear, anicteric sclerae. Pupils are equal ENT Nares normal. No drainage or sinus tenderness. Lips, mucosa, and tongue normal. No Thrush  Lungs: Bilateral air entry Heart: Regular rate and rhythm, no murmur, rub or gallop. Abdomen: Soft, non-tender,not distended. Bowel sounds normal. No masses Extremities: atraumatic, no cyanosis. No edema. No clubbing Skin: No rashes or lesions. Or bruising Lymph: Cervical, supraclavicular normal. Neurologic: Moves all extremities.  But weak overall  Lab Results Recent Labs    06/22/20 0601 06/23/20 0630 06/24/20 0613  WBC 8.0  --  7.0  HGB 7.7*  --  8.0*  HCT 24.8*  --  25.0*  NA 136 140 136  K 3.4* 3.0* 4.0  CL 107 111 105  CO2 22 21* 24  BUN 17 12 10   CREATININE 0.81 0.61 0.57   Liver Panel Recent Labs    06/22/20 0601 06/23/20 0630  PROT 5.8* 4.9*  ALBUMIN 1.9* 1.6*  AST 131* 105*  ALT 109* 95*  ALKPHOS 237* 203*  BILITOT 0.5 0.5     Microbiology: 06/10/2020 CSF culture Streptococcus pneumonia 05/29/2020 blood culture Streptococcus pneumoniae 06/12/2020 blood culture no growth 06/17/20 Epidural abscess culture-no growth 06/19/20 paraspinal abscess culture negative   Assessment/Plan: Strep pneumo meningitis Strep pneumo bacteremia Extensive thoracic epidural abscess.  Underwent multilevel laminectomies and drainage of abscess.  She had 3 drains which have all been removed.  Lumbar paraspinal abscess has been drained as well.  She is currently on ceftriaxone 2 g IV every 12 hours.  Will need antibiotics until 07/23/2020.  Diabetes mellitus which on presentation was uncontrolled and she had DKA. Improved  Abnormal LFTs improving  AKI resolved  Anemia  CLL in remission  Hypothyroidism on Synthroid.   Discussed the management with the care team.

## 2020-06-24 NOTE — Consult Note (Signed)
Physical Medicine and Rehabilitation Consult Reason for Consult: Acute encephalopathy Referring Physician: Joette Catching, MD   HPI: Tonya Myers is a 55 y.o. female with a history of DM, CLL, and chronic back pain who presented to [facility] with acute onset of AMS and inability. MRI of the brain was negative for stroke. She has found to be in DKA and have AKI. Her hospital course has been complicated by pneumococcal sepsis and meningitis confirmed by blood cultures and lumbar puncture. MRI of the spine revealed paraspinal and epidural abscesses. She is being followed by Neurosurgery, IR, and ID. Repeat lumbar spine MRI on 2/21 confirmed enlarging paraspinal abscess. Repeat MRI cervical and thoracic spine 2/22 noted enlarging epidural fluid collection for which she underwent surgery on 2/22. Physical Medicine & Rehabilitation was consulted to assess candidacy for [name].    Review of Systems  Constitutional: Positive for malaise/fatigue.  HENT: Negative.   Eyes: Negative.   Respiratory: Negative.   Cardiovascular: Negative.   Gastrointestinal: Negative.   Genitourinary: Negative.   Musculoskeletal: Negative.   Skin: Negative.   Neurological: Positive for weakness.  Endo/Heme/Allergies: Negative.   Psychiatric/Behavioral: Negative.    Past Medical History:  Diagnosis Date  . Acute hemorrhoid 03/11/2015  . Anxiety   . Arthritis   . Asthma   . Cancer ([place])   . Chronic back pain   . CLL (chronic lymphocytic leukemia) ([place])   . Depression   . Diabetes mellitus without complication ([place])   . Genital herpes    type 2  . Hypertension   . Hypothyroidism   . Vertigo    Past Surgical History:  Procedure Laterality Date  . ABDOMINAL HYSTERECTOMY    . BILATERAL SALPINGOOPHORECTOMY  2009  . BREAST BIOPSY Right 05/17/2016   FIBROADENOMATOUS CHANGE AND SCLEROSING ADENOSIS WITH  . COLONOSCOPY WITH PROPOFOL N/A 06/16/2015   Procedure: COLONOSCOPY WITH PROPOFOL;  Surgeon: [name], MD;  Location: [facility] ENDOSCOPY;  Service: Endoscopy;  Laterality: N/A;  . LAPAROSCOPIC SUPRACERVICAL HYSTERECTOMY  2009   due to [employer]  . OOPHORECTOMY    . THORACIC LAMINECTOMY FOR EPIDURAL ABSCESS Bilateral 06/17/2020   Procedure: THORACIC LAMINECTOMY FOR EPIDURAL ABSCESS;  Surgeon: Deetta Perla, MD;  Location: [facility] ORS;  Service: Neurosurgery;  Laterality: Bilateral;   Family History  Problem Relation Age of Onset  . Cancer Paternal Aunt   . Breast cancer Maternal Aunt 70   Social History:  reports that she has never smoked. She has never used smokeless tobacco. She reports current alcohol use of about 1.0 standard drink of alcohol per week. She reports that she does not use drugs. Allergies: No Known Allergies Medications Prior to Admission  Medication Sig Dispense Refill  . cyclobenzaprine (FLEXERIL) 10 MG tablet Take 10 mg by mouth 3 (three) times daily.    Marland Kitchen diltiazem (DILACOR XR) 120 MG 24 hr capsule Take 120 mg by mouth daily.     Marland Kitchen estradiol (ESTRACE) 0.5 MG tablet Take 0.5 mg by mouth daily.  5  . ezetimibe (ZETIA) 10 MG tablet Take 10 mg by mouth daily.     . furosemide (LASIX) 20 MG tablet Take 20 mg by mouth daily.    . Insulin Glargine-Lixisenatide 100-33 UNT-MCG/ML SOPN Inject 48 Units into the skin daily before breakfast.    . insulin lispro (HUMALOG) 200 UNIT/ML KwikPen Inject 100 Units into the skin daily. Sliding scale average of 10 units    . levocetirizine (XYZAL) 5 MG tablet Take 5 mg  by mouth at bedtime.    Marland Kitchen levothyroxine (SYNTHROID, LEVOTHROID) 150 MCG tablet Take 150 mcg by mouth daily before breakfast.     . metFORMIN (GLUCOPHAGE-XR) 500 MG 24 hr tablet Take 500 mg by mouth 2 (two) times daily.    . montelukast (SINGULAIR) 10 MG tablet Take 10 mg by mouth at bedtime.    . naproxen (NAPROSYN) 500 MG tablet Take 500 mg by mouth 2 (two) times daily.    . pregabalin (LYRICA) 75 MG capsule Take 75 mg by mouth 2 (two) times daily.    . rosuvastatin  (CRESTOR) 10 MG tablet Take 10 mg by mouth at bedtime.    Marland Kitchen telmisartan-hydrochlorothiazide (MICARDIS HCT) 80-25 MG tablet Take 1 tablet by mouth daily.    . valACYclovir (VALTREX) 1000 MG tablet Take 1 tablet (1,000 mg total) by mouth 2 (two) times daily. 60 tablet 1    Home: Home Living Family/patient expects to be discharged to:: Private residence Living Arrangements: Spouse/significant other Available Help at Discharge: Family,Available 24 hours/day Type of Home: House Home Access: Level entry Home Layout: Two level,Able to live on main level with bedroom/bathroom Alternate Level Stairs-Number of Steps: 16 Alternate Level Stairs-Rails:  (Pt unable to recall) Bathroom Shower/Tub: Multimedia programmer: Standard Home Equipment: Shower seat,Grab bars - toilet  Functional History: Prior Function Level of Independence: Independent Comments: Data on living situation and PLOF taken from chart review. Pt unable at this time to communicate this information. Functional Status:  Mobility: Bed Mobility Overal bed mobility: Needs Assistance Bed Mobility: Rolling Rolling: Mod assist Supine to sit: Mod assist,+2 for physical assistance Sit to supine: Max assist,Mod assist,+2 for physical assistance General bed mobility comments: able to reach cross body, grab opposite-side bed rails, to assist with rolling in bed Transfers General transfer comment: did not attempt OOB activity Ambulation/Gait General Gait Details: unsafe/unable    ADL: ADL Overall ADL's : Needs assistance/impaired Grooming: Wash/dry face,Set up,Bed level Grooming Details (indicate cue type and reason): Following set-up assistance, pt able to bring washcloth to face with increased time and effort Upper Body Dressing : Moderate assistance,Bed level,Cueing for sequencing Upper Body Dressing Details (indicate cue type and reason): Pt able to assist with threading arms through sleeves Lower Body Dressing: Maximal  assistance,Bed level Lower Body Dressing Details (indicate cue type and reason): donning socks Toileting- Clothing Manipulation and Hygiene: Maximal assistance,Bed level,+2 for physical assistance Functional mobility during ADLs: Maximal assistance  Cognition: Cognition Overall Cognitive Status: Impaired/Different from baseline Orientation Level: Oriented to person,Oriented to place,Oriented to situation Cognition Arousal/Alertness: Lethargic Behavior During Therapy: Flat affect Overall Cognitive Status: Impaired/Different from baseline General Comments: much more engaged/aware than last week  Blood pressure 134/88, pulse 97, temperature 98.3 F (36.8 C), temperature source Oral, resp. rate 17, height 6' (1.829 m), weight 104.3 kg, SpO2 97 %. Physical Exam  Gen: no distress, normal appearing HEENT: oral mucosa pink and moist, NCAT Cardio: Reg rate Chest: normal effort, normal rate of breathing Abd: soft, non-distended Ext: no edema Psych: pleasant, normal affect Skin: intact Neuro: Alert and oriented x3 Musculoskeletal: 4/5 strength throughout  Results for orders placed or performed during the hospital encounter of 06/09/20 (from the past 24 hour(s))  Glucose, capillary     Status: Abnormal   Collection Time: 06/23/20 12:09 PM  Result Value Ref Range   Glucose-Capillary 295 (H) 70 - 99 mg/dL  Glucose, capillary     Status: Abnormal   Collection Time: 06/23/20  3:47 PM  Result Value  Ref Range   Glucose-Capillary 181 (H) 70 - 99 mg/dL  Glucose, capillary     Status: Abnormal   Collection Time: 06/23/20  8:32 PM  Result Value Ref Range   Glucose-Capillary 161 (H) 70 - 99 mg/dL  Magnesium     Status: None   Collection Time: 06/24/20  6:13 AM  Result Value Ref Range   Magnesium 1.8 1.7 - 2.4 mg/dL  Basic metabolic panel     Status: Abnormal   Collection Time: 06/24/20  6:13 AM  Result Value Ref Range   Sodium 136 135 - 145 mmol/L   Potassium 4.0 3.5 - 5.1 mmol/L    Chloride 105 98 - 111 mmol/L   CO2 24 22 - 32 mmol/L   Glucose, Bld 194 (H) 70 - 99 mg/dL   BUN 10 6 - 20 mg/dL   Creatinine, Ser 0.57 0.44 - 1.00 mg/dL   Calcium 7.8 (L) 8.9 - 10.3 mg/dL   GFR, Estimated >60 >60 mL/min   Anion gap 7 5 - 15  CBC     Status: Abnormal   Collection Time: 06/24/20  6:13 AM  Result Value Ref Range   WBC 7.0 4.0 - 10.5 K/uL   RBC 3.09 (L) 3.87 - 5.11 MIL/uL   Hemoglobin 8.0 (L) 12.0 - 15.0 g/dL   HCT 25.0 (L) 36.0 - 46.0 %   MCV 80.9 80.0 - 100.0 fL   MCH 25.9 (L) 26.0 - 34.0 pg   MCHC 32.0 30.0 - 36.0 g/dL   RDW 16.8 (H) 11.5 - 15.5 %   Platelets 498 (H) 150 - 400 K/uL   nRBC 0.0 0.0 - 0.2 %  Phosphorus     Status: Abnormal   Collection Time: 06/24/20  6:13 AM  Result Value Ref Range   Phosphorus 2.2 (L) 2.5 - 4.6 mg/dL  Glucose, capillary     Status: Abnormal   Collection Time: 06/24/20  7:53 AM  Result Value Ref Range   Glucose-Capillary 205 (H) 70 - 99 mg/dL   No results found.   Assessment/Plan: Diagnosis: Spinal epidural abscess s/p surgery 1. Does the need for close, 24 hr/day medical supervision in concert with the patient's rehab needs make it unreasonable for this patient to be served in a less intensive setting? Yes 2. Co-Morbidities requiring supervision/potential complications:  1. Encephalopathy: she will require intensive SLP 2. DKA- she will require close monitoring of CBGs 3. AKI: she will require close monitoring of kidney function 4. DM2 5. CLL 6. Chronic back pain 3. Due to bladder management, bowel management, safety, skin/wound care, disease management, medication administration, pain management and patient education, does the patient require 24 hr/day rehab nursing? Yes 4. Does the patient require coordinated care of a physician, rehab nurse, therapy disciplines of PT, OT, SLP to address physical and functional deficits in the context of the above medical diagnosis(es)? Yes Addressing deficits in the following areas:  balance, endurance, locomotion, strength, transferring, bowel/bladder control, bathing, dressing, feeding, grooming, toileting, cognition and psychosocial support 5. Can the patient actively participate in an intensive therapy program of at least 3 hrs of therapy per day at least 5 days per week? Potentially if she continues to make gains with therapy.  6. The potential for patient to make measurable gains while on inpatient rehab is good 7. Anticipated functional outcomes upon discharge from inpatient rehab are min assist  with PT, min assist with OT, min assist with SLP. We would need to confirm this caregiver support.  8.  Estimated rehab length of stay to reach the above functional goals is: 3-4 weeks 9. Anticipated discharge destination: Home 10. Overall Rehab/Functional Prognosis: good  RECOMMENDATIONS: This patient's condition is appropriate for continued rehabilitative care in the following setting: [name] Patient has agreed to participate in recommended program. Yes Note that insurance prior authorization may be required for reimbursement for recommended care.  Comment: Thank you for this consult. Admission coordinator to follow.   I have personally performed a face to face diagnostic evaluation, including, but not limited to relevant history and physical exam findings, of this patient and developed relevant assessment and plan.  Additionally, I have reviewed and concur with the physician assistant's documentation above.  Leeroy Cha, MD  Izora Ribas, MD 06/24/2020

## 2020-06-24 NOTE — TOC Progression Note (Signed)
Transition of Care [facility]) - Progression Note    Patient Details  Name: Tonya Myers MRN: [id number] Date of Birth: Oct 25, 1965  [facility] [name] Contact  Shelbie Hutching, RN Phone Number: 06/24/2020, 4:05 PM  Clinical Narrative:    The [facility] center is unable to offer a bed at this time but they suggested to check back next week if patient is still in the hospital.     Expected Discharge Plan: IP Rehab Facility Barriers to Discharge: Continued Medical Work up  Expected Discharge Plan and Services Expected Discharge Plan: [place]   Discharge Planning Services: CM Consult Post Acute Care Choice: IP Rehab                                         Social Determinants of Health (SDOH) Interventions    Readmission Risk Interventions No flowsheet data found.

## 2020-06-24 NOTE — Progress Notes (Signed)
Tonya Myers  TKZ:[id number] DOB: [birth year] DOA: 06/09/2020 PCP: [name], MD    Brief Narrative:  55 year old with a history of DM, CLL, and chronic back pain who presented to [facility] with acute onset of AMS and the inability to talk.  MRI of the brain was negative for stroke.  She was found to be in DKA and also suffering with acute kidney injury.  During the course of her hospital stay she became febrile.  A full evaluation ultimately revealed the diagnosis of pneumococcal sepsis and pneumococcal meningitis as confirmed with blood cultures and lumbar puncture.  MRI of the spine revealed a paraspinal abscess as well as an epidural abscess.  Neurosurgery and IR have been consulted and are participating in her care.  Worsening of the patient's peripheral neurologic symptoms led to a repeat MRI of the lumbar spine 2/21 which confirmed an enlarging paraspinal abscess.  Repeat MRI of the cervical and thoracic spine 2/22 noted an enlarging dorsal epidural fluid collection.  Given these findings the patient was taken to the OR by neurosurgery 2/22.  Antimicrobials:  Ceftriaxone 2/15 > Cefepime 2/15 Vancomycin 2/15 > 2/17  DVT prophylaxis: Lovenox   Subjective: Vital signs are stable.  The patient is afebrile.  CBG control improving.  Potassium normalized.  Magnesium approaching normal.  Sitting up in a bedside chair.  Remains alert but is confused again today as per her recent exam.  Worked with therapy to a much more significant extent today.  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 Patient failed conservative antibiotic therapy - Neurosurgery took the patient to the OR 2/22 for a T3/4 laminectomy, T5-7 laminectomy, T9/10 laminectomy and evacuation of abscess - aspiration of lumbar paraspinal abscess completed by IR 2/24 - abx per ID continue, w/ plan for 6 weeks total tx   Fluctuating mental status - Acute metabolic  encephalopathy Neurologically improving otherwise - no other sx to suggest decompensation - follow closely - discontinued all potentially sedating meds - ammonia level normal - more alert, but remains confused - monitor -expect it will simply take many more days for her mental status to slowly continue to improve  Mild transaminitis Unclear etiology - cont to improve following modest fluid challenge -recheck in a.m.  Acute kidney injury Creatinine 1.56 at presentation - resolved w/ supportive care  Severely uncontrolled DM2 with DKA at presentation CBG trending upward with increased intake so will gently adjust treatment - avoid hypoglycemia in setting of intermittent poor intake   Anemia Likely due primarily to severe smoldering infection (anemia panel c/w this) - follow trend - no indication for transfusion at this time  Essential HTN Blood pressure controlled at this time  Obesity - Body mass index is 31.19 kg/m.  CLL  Hyponatremia Resolved  Hypomagnesemia Supplement intermittently as needed -due to limited oral intake intermittently  Hypokalemia Likely due to poor oral intake -corrected with supplementation  Disposition  [name] eval for possible admission -approaching medical stability and aggressive rehab of critical importance   Code Status: FULL CODE Family Communication:  Status is: Inpatient  Remains inpatient appropriate because:Inpatient level of care appropriate due to severity of illness   Dispo: The patient is from: Home              Anticipated d/c is to: unclear              Anticipated d/c date is: > 3 days  Patient currently is not medically stable to d/c.   Difficult to place patient No   Consultants:  Neurosurgery ID Neurology IR  Objective: Blood pressure 132/76, pulse 98, temperature 98.4 F (36.9 C), temperature source Oral, resp. rate 18, height 6' (1.829 m), weight 104.3 kg, SpO2 100 %.  Intake/Output Summary (Last 24  hours) at 06/24/2020 1558 Last data filed at 06/24/2020 0615 Gross per 24 hour  Intake 590 ml  Output 2450 ml  Net -1860 ml   Filed Weights   06/17/20 1621 06/18/20 0500 06/19/20 0500  Weight: 109.1 kg 104.6 kg 104.3 kg    Examination: General: No acute respiratory distress -alert but mildly confused Lungs: Clear to auscultation bilaterally -no wheezing Cardiovascular: RRR w/o M  Abdomen: NT/ND, soft, BS positive, no rebound  Extremities: No C/C/E B lower extremities  CBC: Recent Labs  Lab 06/21/20 0532 06/22/20 0601 06/24/20 0613  WBC 10.1 8.0 7.0  HGB 8.1* 7.7* 8.0*  HCT 26.6* 24.8* 25.0*  MCV 83.1 82.1 80.9  PLT 468* 466* 768*   Basic Metabolic Panel: Recent Labs  Lab 06/18/20 0615 06/19/20 0605 06/21/20 0532 06/22/20 0601 06/23/20 0500 06/23/20 0630 06/24/20 0613  NA  --    < > 140 136  --  140 136  K  --    < > 4.0 3.4*  --  3.0* 4.0  CL  --    < > 107 107  --  111 105  CO2  --    < > 26 22  --  21* 24  GLUCOSE  --    < > 140* 215*  --  98 194*  BUN  --    < > 18 17  --  12 10  CREATININE  --    < > 0.89 0.81  --  0.61 0.57  CALCIUM  --    < > 8.1* 7.6*  --  6.6* 7.8*  MG 1.8   < > 1.7  --  1.3*  --  1.8  PHOS 3.3  --   --   --   --   --  2.2*   < > = values in this interval not displayed.   GFR: Estimated Creatinine Clearance: 106.1 mL/min (by C-G formula based on SCr of 0.57 mg/dL).  Liver Function Tests: Recent Labs  Lab 06/20/20 0600 06/21/20 0532 06/22/20 0601 06/23/20 0630  AST 91* 198* 131* 105*  ALT 57* 116* 109* 95*  ALKPHOS 215* 245* 237* 203*  BILITOT 0.7 0.9 0.5 0.5  PROT 5.9* 6.1* 5.8* 4.9*  ALBUMIN 2.0* 2.0* 1.9* 1.6*    HbA1C: Hgb A1c MFr Bld  Date/Time Value Ref Range Status  06/09/2020 08:37 PM 8.0 (H) 4.8 - 5.6 % Final    Comment:    (NOTE)         Prediabetes: 5.7 - 6.4         Diabetes: >6.4         Glycemic control for adults with diabetes: <7.0     CBG: Recent Labs  Lab 06/23/20 1209 06/23/20 1547  06/23/20 2032 06/24/20 0753 06/24/20 1202  GLUCAP 295* 181* 161* 205* 228*    Recent Results (from the past 240 hour(s))  Aerobic/Anaerobic Culture w Gram Stain (surgical/deep wound)     Status: None   Collection Time: 06/17/20  5:52 PM   Specimen: PATH Other; Body Fluid  Result Value Ref Range Status   Specimen Description   Final    WOUND Performed at  [facility], [address], [place], [place] [address]    Special Requests   Final    EPIDURAL Performed at [facility], [address] [place], [place] [address]    Gram Stain   Final    ABUNDANT WBC PRESENT, PREDOMINANTLY PMN NO ORGANISMS SEEN    Culture   Final    No growth aerobically or anaerobically. Performed at [facility], [place] [address], [place], [place] [address]    Report Status 06/22/2020 FINAL  Final  Aerobic/Anaerobic Culture w Gram Stain (surgical/deep wound)     Status: None   Collection Time: 06/17/20  5:55 PM   Specimen: PATH Other; Body Fluid  Result Value Ref Range Status   Specimen Description   Final    WOUND Performed at [facility], [address] [place], [place] [address]    Special Requests   Final    THORACIC Performed at [facility], [address] [place], [place] [address]    Gram Stain   Final    RARE WBC PRESENT, PREDOMINANTLY PMN NO ORGANISMS SEEN    Culture   Final    No growth aerobically or anaerobically. Performed at [facility], [place] [address] [place], [place] [address]    Report Status 06/22/2020 FINAL  Final  Aerobic/Anaerobic Culture (surgical/deep wound)     Status: None   Collection Time: 06/19/20 11:10 AM   Specimen: Abscess  Result Value Ref Range Status   Specimen Description   Final    ABSCESS LEFT LUMBAR FLUID/MYOSITIS Performed at [facility], [address], [place], [place] [address]    Special Requests   Final    NONE Performed at [facility], [address]  [place], [place] [address]    Gram Stain   Final    MODERATE WBC PRESENT,BOTH PMN AND MONONUCLEAR NO ORGANISMS SEEN    Culture   Final    No growth aerobically or anaerobically. Performed at [facility], [place] [address] [place], [place] [address]    Report Status 06/24/2020 FINAL  Final     Scheduled Meds: . chlorhexidine  15 mL Mouth Rinse BID  . Chlorhexidine Gluconate Cloth  6 each Topical Q0600  . enoxaparin (LOVENOX) injection  0.5 mg/kg Subcutaneous Q24H  . insulin aspart  0-15 Units Subcutaneous TID WC  . insulin aspart  0-5 Units Subcutaneous QHS  . insulin aspart  3 Units Subcutaneous TID WC  . insulin glargine  36 Units Subcutaneous Daily  . levothyroxine  150 mcg Oral Q0600  . mouth rinse  15 mL Mouth Rinse q12n4p  . melatonin  5 mg Oral QHS  . pantoprazole  40 mg Oral Daily  . potassium chloride  40 mEq Oral BID  . sodium chloride flush  10-40 mL Intracatheter Q12H  . thiamine  100 mg Oral Daily  . valACYclovir  1,000 mg Oral BID   Continuous Infusions: . sodium chloride 10 mL/hr at 06/24/20 0647  . cefTRIAXone (ROCEPHIN)  IV Stopped (06/24/20 0615)     LOS: 15 days   Cherene Altes, MD [facility] Office  [phone] Pager - Text Page per Amion  If 7PM-7AM, please contact night-coverage per Amion 06/24/2020, 3:58 PM

## 2020-06-25 DIAGNOSIS — R7881 Bacteremia: Secondary | ICD-10-CM | POA: Diagnosis not present

## 2020-06-25 LAB — MAGNESIUM: Magnesium: 1.6 mg/dL — ABNORMAL LOW (ref 1.7–2.4)

## 2020-06-25 LAB — CBC
HCT: 26.6 % — ABNORMAL LOW (ref 36.0–46.0)
Hemoglobin: 8.2 g/dL — ABNORMAL LOW (ref 12.0–15.0)
MCH: 25.1 pg — ABNORMAL LOW (ref 26.0–34.0)
MCHC: 30.8 g/dL (ref 30.0–36.0)
MCV: 81.3 fL (ref 80.0–100.0)
Platelets: 495 10*3/uL — ABNORMAL HIGH (ref 150–400)
RBC: 3.27 MIL/uL — ABNORMAL LOW (ref 3.87–5.11)
RDW: 16.6 % — ABNORMAL HIGH (ref 11.5–15.5)
WBC: 7.6 10*3/uL (ref 4.0–10.5)
nRBC: 0 % (ref 0.0–0.2)

## 2020-06-25 LAB — COMPREHENSIVE METABOLIC PANEL
ALT: 92 U/L — ABNORMAL HIGH (ref 0–44)
AST: 62 U/L — ABNORMAL HIGH (ref 15–41)
Albumin: 2.2 g/dL — ABNORMAL LOW (ref 3.5–5.0)
Alkaline Phosphatase: 231 U/L — ABNORMAL HIGH (ref 38–126)
Anion gap: 9 (ref 5–15)
BUN: 10 mg/dL (ref 6–20)
CO2: 22 mmol/L (ref 22–32)
Calcium: 8.1 mg/dL — ABNORMAL LOW (ref 8.9–10.3)
Chloride: 106 mmol/L (ref 98–111)
Creatinine, Ser: 0.59 mg/dL (ref 0.44–1.00)
GFR, Estimated: >60 mL/min (ref >60)
Glucose, Bld: 130 mg/dL — ABNORMAL HIGH (ref 70–99)
Potassium: 4 mmol/L (ref 3.5–5.1)
Sodium: 137 mmol/L (ref 135–145)
Total Bilirubin: 0.7 mg/dL (ref 0.3–1.2)
Total Protein: 6.2 g/dL — ABNORMAL LOW (ref 6.5–8.1)

## 2020-06-25 LAB — C-REACTIVE PROTEIN: CRP: 11.7 mg/dL — ABNORMAL HIGH (ref <1.0)

## 2020-06-25 LAB — SEDIMENTATION RATE: Sed Rate: 128 mm/hr — ABNORMAL HIGH (ref 0–30)

## 2020-06-25 LAB — GLUCOSE, CAPILLARY
Glucose-Capillary: 174 mg/dL — ABNORMAL HIGH (ref 70–99)
Glucose-Capillary: 271 mg/dL — ABNORMAL HIGH (ref 70–99)
Glucose-Capillary: 220 mg/dL — ABNORMAL HIGH (ref 70–99)
Glucose-Capillary: 293 mg/dL — ABNORMAL HIGH (ref 70–99)

## 2020-06-25 MED ORDER — INSULIN GLARGINE 100 UNIT/ML ~~LOC~~ SOLN
40.0000 [IU] | Freq: Every day | SUBCUTANEOUS | Status: DC
  Administered 2020-06-25 – 2020-06-26 (×2): 40 [IU] via SUBCUTANEOUS
  Filled 2020-06-25 (×3): qty 0.4

## 2020-06-25 MED ORDER — MAGNESIUM OXIDE 400 (241.3 MG) MG PO TABS
400.0000 mg | ORAL_TABLET | Freq: Two times a day (BID) | ORAL | Status: DC
  Administered 2020-06-25 – 2020-06-27 (×5): 400 mg via ORAL
  Filled 2020-06-25 (×5): qty 1

## 2020-06-25 NOTE — Plan of Care (Signed)
End of Shift Summary:   Alert and oriented x3, reorientation required at times. Remained on room air overnight, sats >94%. Pain managed with prn medications. Q2T as allowed, UOP adequate via Purewick. (+) BM. Remained free from falls or injury. Bed low and in locked position, bed alarm on. Call bell within reach and encouraged to use.    Problem: Education: Goal: Knowledge of General Education information will improve Description: Including pain rating scale, medication(s)/side effects and non-pharmacologic comfort measures Outcome: Progressing   Problem: Health Behavior/Discharge Planning: Goal: Ability to manage health-related needs will improve Outcome: Progressing   Problem: Clinical Measurements: Goal: Ability to maintain clinical measurements within normal limits will improve Outcome: Progressing Goal: Will remain free from infection Outcome: Progressing Goal: Diagnostic test results will improve Outcome: Progressing Goal: Respiratory complications will improve Outcome: Progressing Goal: Cardiovascular complication will be avoided Outcome: Progressing   Problem: Activity: Goal: Risk for activity intolerance will decrease Outcome: Progressing   Problem: Nutrition: Goal: Adequate nutrition will be maintained Outcome: Progressing   Problem: Coping: Goal: Level of anxiety will decrease Outcome: Progressing   Problem: Elimination: Goal: Will not experience complications related to bowel motility Outcome: Progressing Goal: Will not experience complications related to urinary retention Outcome: Progressing   Problem: Safety: Goal: Ability to remain free from injury will improve Outcome: Progressing   Problem: Pain Managment: Goal: General experience of comfort will improve Outcome: Progressing   Problem: Skin Integrity: Goal: Risk for impaired skin integrity will decrease Outcome: Progressing

## 2020-06-25 NOTE — Progress Notes (Signed)
Occupational Therapy Treatment Patient Details Name: Tonya Myers MRN: [id number] DOB: 02-27-1966 Today's Date: 06/25/2020    History of present illness Pt is a 55 y/o female admitted from home on 06/09/20 after presenting with AMS. Pt being treated for acute encephalopathy. 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 Pt also found to have paraspinal abscess, small epidural abscess, septic arthritis L2-L3 and L3-L4 with Neurosurgery & IR recommended serial imaging. Now s/p T3-4, T5-7, T9-10 laminectomy and evacuation of abscess (06/17/20); s/p IR drainage of paraspinal abscess later this date (06/19/20).  PMH: hypothyroidism, DDD, cervical radiculopathy, CLL, DM, HTN, HLD, anxiety, arthritis, CA, chronic back pain, asthma, depression, vertigo.   OT comments  Ms. Bezold demonstrated improved engagement and activity tolerance today, able to participate in bed mobility with fewer cues and less physical assistance than in previous sessions, as well as tolerating sitting EOB for > 5 minutes (compared to ~ 90 seconds in prior attempt), while reporting mild dizziness. Pt's partner present throughout session and requested education re: how he can best assist Ms. Bonawitz in her rehabilitation. Pt still displayed divided attention, distractibility, and confusion, but was more aware than in past days, and also exhibited some awareness of her deficits, at one point saying, "it's hard for me to listen to other things when the TV is on." Recommend ongoing OT while pt is hospitalized. Given pt's previous high level of function, young age, supportive living situation, and steady improvement, recommend transfer to inpatient rehab post D/C from [facility].  Follow Up Recommendations  [name]    Equipment Recommendations       Recommendations for Other Services      Precautions / Restrictions Precautions Precautions: Fall;Back Restrictions Weight Bearing Restrictions: No       Mobility Bed  Mobility Overal bed mobility: Needs Assistance Bed Mobility: Supine to Sit;Sit to Supine Rolling: Min guard   Supine to sit: Mod assist Sit to supine: Mod assist   General bed mobility comments: Improved effort and performance in bed mobility    Transfers                 General transfer comment: did not attempt    Balance Overall balance assessment: Needs assistance Sitting-balance support: No upper extremity supported;Feet supported Sitting balance-Leahy Scale: Fair Sitting balance - Comments: improved sitting balance                                   ADL either performed or assessed with clinical judgement   ADL Overall ADL's : Needs assistance/impaired                 Upper Body Dressing : Minimal assistance;Sitting                           Vision   Additional Comments: improvement in visual tracking   Perception     Praxis      Cognition Arousal/Alertness: Awake/alert Behavior During Therapy: Flat affect Overall Cognitive Status: Impaired/Different from baseline                                 General Comments: difficulty maintaining focus and attention; some word finding/expressive difficulties        Exercises Other Exercises Other Exercises: Rolling bilat, Min A +1 for linen change, fewer cues and physical  assist required than during earlier sessions Other Exercises: Supine/sit x2, mod assist +1 for truncal elevation and LE management. Other Exercises: Toilet transfer, dep assist +2 via hoyer lift to/from [facility] (due to urgency of toileting needs, inability to attend to alternative transfer methods due to bowel urgency).  Able to maintain supported sitting posture on BSC with close sup; decreased restlessness, increased focus once bowels eliminated (continently once on [facility]).  Dep for hygiene. Other Exercises: Family education   Shoulder Instructions       General Comments      Pertinent Vitals/  Pain       Pain Assessment: Faces Faces Pain Scale: Hurts little more Pain Location: back, neck Pain Descriptors / Indicators: Grimacing Pain Intervention(s): Limited activity within patient's tolerance;Repositioned;Monitored during session  Home Living                                          Prior Functioning/Environment              Frequency  Min 3X/week        Progress Toward Goals  OT Goals(current goals can now be found in the care plan section)  Progress towards OT goals: Progressing toward goals  Acute Rehab OT Goals Patient Stated Goal: to get better OT Goal Formulation: With patient Time For Goal Achievement: 06/29/20 Potential to Achieve Goals: Good  Plan Discharge plan remains appropriate;Frequency remains appropriate    Co-evaluation                 AM-PAC OT "6 Clicks" Daily Activity     Outcome Measure   Help from another person eating meals?: A Little Help from another person taking care of personal grooming?: A Lot Help from another person toileting, which includes using toliet, bedpan, or urinal?: A Lot Help from another person bathing (including washing, rinsing, drying)?: A Lot Help from another person to put on and taking off regular upper body clothing?: A Lot Help from another person to put on and taking off regular lower body clothing?: A Lot 6 Click Score: 13    End of Session    OT Visit Diagnosis: Unsteadiness on feet (R26.81);Muscle weakness (generalized) (M62.81)   Activity Tolerance Patient tolerated treatment well   Patient Left in bed;with call bell/phone within reach;with bed alarm set;with nursing/sitter in room;with family/visitor present   Nurse Communication          Time: 6440-3474 OT Time Calculation (min): 48 min  Charges: OT General Charges $OT Visit: 1 Visit OT Treatments $Self Care/Home Management : 38-52 mins  Josiah Lobo, PhD, [place], OTR/L ascom [phone] 06/25/20, 3:04  PM

## 2020-06-25 NOTE — TOC Progression Note (Signed)
Transition of Care [facility]) - Progression Note    Patient Details  Name: Tonya Myers MRN: [id number] Date of Birth: 14-Nov-1965  [facility] [name] Contact  Shelbie Hutching, RN Phone Number: 06/25/2020, 2:13 PM  Clinical Narrative:    [facility] inpatient rehab unable to offer a bed.  [name] is following patient and has accepted patient but no bed available today.    Expected Discharge Plan: IP Rehab Facility Barriers to Discharge: Continued Medical Work up  Expected Discharge Plan and Services Expected Discharge Plan: [place]   Discharge Planning Services: CM Consult Post Acute Care Choice: IP Rehab                                         Social Determinants of Health (SDOH) Interventions    Readmission Risk Interventions No flowsheet data found.

## 2020-06-25 NOTE — Progress Notes (Signed)
Inpatient Rehabilitation Admissions Coordinator  I spoke with patient's daughter, Basilia Jumbo, and significant other, [name] by phone. We discussed goals and expectations of a possible [name] admit. [name] can provide 24/7 care giver support at discharge and prefers [name] rather than SNF. I do not have a bed today at [name]. I await bed availability for possible admit.  Danne Baxter, RN, MSN Rehab Admissions Coordinator [phone] 06/25/2020 8:29 AM

## 2020-06-25 NOTE — Progress Notes (Signed)
PROGRESS NOTE    Tonya Myers  WJX:[id number] DOB: 10-09-1965 DOA: 06/09/2020 PCP: [name], MD    Brief Narrative:  55 year old lady with history of type 2 diabetes on insulin, CLL, chronic back pain presented to hospital on 2/14 with altered mental status and unable to to talk.  MRI brain was negative for acute a stroke.  She was found to be in DKA and also suffering from acute kidney injury. Hospital course complicated as she subsequently became febrile.  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  She was also found to have paraspinal abscesses that were treated with surgical incision.  Remains in the hospital and waiting for rehab.   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 Neurosurgery 2 OR for decompression on 06/17/20 where patient underwent T3/4 laminectomy, T5/7 laminectomy, T9/10 laminectomy and evacuation of abscess.  Repeat lumbar paraspinal abscess aspiration by IR on 2/24. Followed by infectious disease, currently on ceftriaxone with total plan for 6 weeks until 3/30. Followed by neurosurgery for postoperative plans. Mobility.  Weightbearing as tolerated.  Continue aggressive rehab and referred to [name].  Has a PICC line on right arm for antibiotic.  Remove when she finishes antibiotics.  Acute infective metabolic encephalopathy: As above.  Neurologically  improving.  Acute kidney injury: On presentation.  Improved and resolved.  Anemia of chronic disease: Multifactorial.  Stable.  No indication for transfusion.  Uncontrolled type 2 diabetes with DKA on presentation: Hemoglobin A1c 8.  She had hypoglycemic episodes with poor appetite.  Now her blood sugars are more than 200 consistently.  Increase long-acting insulin today.  Replace electrolytes.  Replace magnesium.  Hypertension: Blood pressures are fairly stable.   DVT prophylaxis: Place and maintain sequential compression device Start: 06/18/20 1650   Code Status: Full code Family Communication: Significant other at the bedside Disposition Plan: Status is: Inpatient  Remains inpatient appropriate because:Unsafe d/c plan   Dispo: The patient is from: Home              Anticipated d/c is to: [name]              Patient currently is medically stable to d/c.   Difficult to place patient No         Consultants:   Neurosurgery  Infectious disease    Procedures:   Multiple procedures as above  Antimicrobials:  Antibiotics Given (last 72 hours)    Date/Time Action Medication Dose Rate   06/22/20 1657 New Bag/Given   cefTRIAXone (ROCEPHIN) 2 g in sodium chloride 0.9 % 100 mL IVPB 2 g 200 mL/hr   06/22/20 2201 Given   valACYclovir (VALTREX) tablet 1,000 mg 1,000 mg    06/23/20 0600 New Bag/Given   cefTRIAXone (ROCEPHIN) 2 g in sodium chloride 0.9 % 100 mL IVPB 2 g 200 mL/hr   06/23/20 0958 Given   valACYclovir (VALTREX) tablet 1,000 mg 1,000 mg    06/23/20 1631 New Bag/Given   cefTRIAXone (ROCEPHIN)  2 g in sodium chloride 0.9 % 100 mL IVPB 2 g 200 mL/hr   06/23/20 2114 Given   valACYclovir (VALTREX) tablet 1,000 mg 1,000 mg    06/24/20 0545 New Bag/Given   cefTRIAXone (ROCEPHIN) 2 g in sodium chloride 0.9 % 100 mL IVPB 2 g 200 mL/hr   06/24/20 8341 Given   valACYclovir (VALTREX) tablet 1,000 mg 1,000 mg    06/24/20 1801 New Bag/Given   cefTRIAXone (ROCEPHIN) 2 g in  sodium chloride 0.9 % 100 mL IVPB 2 g 200 mL/hr   06/24/20 2232 Given   valACYclovir (VALTREX) tablet 1,000 mg 1,000 mg    06/25/20 0518 New Bag/Given   cefTRIAXone (ROCEPHIN) 2 g in sodium chloride 0.9 % 100 mL IVPB 2 g 200 mL/hr   06/25/20 1026 Given   valACYclovir (VALTREX) tablet 1,000 mg 1,000 mg          Subjective: Patient was seen and examined.  Her significant other was at the bedside.  Patient denied any pain.  She was excited to work with therapy and was able to get out of the bed.  Had some back pain but controlled with oxycodone.  Remains afebrile.  Waiting for rehab.  Objective: Vitals:   06/25/20 0023 06/25/20 0405 06/25/20 0816 06/25/20 1205  BP: 134/90 (!) 146/86 (!) 154/98 137/73  Pulse: 98 96 94 99  Resp: 20 18 17 17   Temp: 99.1 F (37.3 C) 99.1 F (37.3 C) 97.6 F (36.4 C) 98.4 F (36.9 C)  TempSrc: Oral Oral  Oral  SpO2: 97% 99%  99%  Weight:      Height:        Intake/Output Summary (Last 24 hours) at 06/25/2020 1450 Last data filed at 06/25/2020 0600 Gross per 24 hour  Intake 309.57 ml  Output --  Net 309.57 ml   Filed Weights   06/17/20 1621 06/18/20 0500 06/19/20 0500  Weight: 109.1 kg 104.6 kg 104.3 kg    Examination:  General exam: Chronically sick looking.  Not in any distress.  Looks fairly comfortable on room air. Respiratory system: Bilateral clear. Cardiovascular system: S1 & S2 heard, no added sounds.   Gastrointestinal system: Soft and nontender.  Bowel sounds present.. Central nervous system: Alert and oriented. No focal neurological deficits. Extremities: Patient has generalized weakness both lower extremities.    Data Reviewed: I have personally reviewed following labs and imaging studies  CBC: Recent Labs  Lab 06/20/20 0600 06/21/20 0532 06/22/20 0601 06/24/20 0613 06/25/20 0616  WBC 12.1* 10.1 8.0 7.0 7.6  HGB 8.3* 8.1* 7.7* 8.0* 8.2*  HCT 27.1* 26.6* 24.8* 25.0* 26.6*  MCV 82.9 83.1 82.1 80.9 81.3  PLT 436* 468*  466* 498* 962*   Basic Metabolic Panel: Recent Labs  Lab 06/19/20 0605 06/20/20 0600 06/21/20 0532 06/22/20 0601 06/23/20 0500 06/23/20 0630 06/24/20 0613 06/25/20 0616  NA 138   < > 140 136  --  140 136 137  K 3.6   < > 4.0 3.4*  --  3.0* 4.0 4.0  CL 104   < > 107 107  --  111 105 106  CO2 24   < > 26 22  --  21* 24 22  GLUCOSE 343*   < > 140* 215*  --  98 194* 130*  BUN 18   < > 18 17  --  12 10 10   CREATININE 0.83   < > 0.89 0.81  --  0.61 0.57 0.59  CALCIUM 7.5*   < >  8.1* 7.6*  --  6.6* 7.8* 8.1*  MG 1.7  --  1.7  --  1.3*  --  1.8 1.6*  PHOS  --   --   --   --   --   --  2.2*  --    < > = values in this interval not displayed.   GFR: Estimated Creatinine Clearance: 106.1 mL/min (by C-G formula based on SCr of 0.59 mg/dL). Liver Function Tests: Recent Labs  Lab 06/20/20 0600 06/21/20 0532 06/22/20 0601 06/23/20 0630 06/25/20 0616  AST 91* 198* 131* 105* 62*  ALT 57* 116* 109* 95* 92*  ALKPHOS 215* 245* 237* 203* 231*  BILITOT 0.7 0.9 0.5 0.5 0.7  PROT 5.9* 6.1* 5.8* 4.9* 6.2*  ALBUMIN 2.0* 2.0* 1.9* 1.6* 2.2*   No results for input(s): LIPASE, AMYLASE in the last 168 hours. Recent Labs  Lab 06/20/20 0545 06/21/20 0532  AMMONIA <9* 10   Coagulation Profile: No results for input(s): INR, PROTIME in the last 168 hours. Cardiac Enzymes: No results for input(s): CKTOTAL, CKMB, CKMBINDEX, TROPONINI in the last 168 hours. BNP (last 3 results) No results for input(s): PROBNP in the last 8760 hours. HbA1C: No results for input(s): HGBA1C in the last 72 hours. CBG: Recent Labs  Lab 06/24/20 1202 06/24/20 1707 06/24/20 2111 06/25/20 0814 06/25/20 1211  GLUCAP 228* 210* 185* 174* 271*   Lipid Profile: No results for input(s): CHOL, HDL, LDLCALC, TRIG, CHOLHDL, LDLDIRECT in the last 72 hours. Thyroid Function Tests: No results for input(s): TSH, T4TOTAL, FREET4, T3FREE, THYROIDAB in the last 72 hours. Anemia Panel: No results for input(s): VITAMINB12,  FOLATE, FERRITIN, TIBC, IRON, RETICCTPCT in the last 72 hours. Sepsis Labs: No results for input(s): PROCALCITON, LATICACIDVEN in the last 168 hours.  Recent Results (from the past 240 hour(s))  Aerobic/Anaerobic Culture w Gram Stain (surgical/deep wound)     Status: None   Collection Time: 06/17/20  5:52 PM   Specimen: PATH Other; Body Fluid  Result Value Ref Range Status   Specimen Description   Final    WOUND Performed at [facility], [address] [place], [place] [address]    Special Requests   Final    EPIDURAL Performed at [facility], [address] [place], [place] [address]    Gram Stain   Final    ABUNDANT WBC PRESENT, PREDOMINANTLY PMN NO ORGANISMS SEEN    Culture   Final    No growth aerobically or anaerobically. Performed at [facility], [place] [address], [place], [place] [address]    Report Status 06/22/2020 FINAL  Final  Aerobic/Anaerobic Culture w Gram Stain (surgical/deep wound)     Status: None   Collection Time: 06/17/20  5:55 PM   Specimen: PATH Other; Body Fluid  Result Value Ref Range Status   Specimen Description   Final    WOUND Performed at [facility], [address] [place], [place] [address]    Special Requests   Final    THORACIC Performed at [facility], [address] [place], [place] [address]    Gram Stain   Final    RARE WBC PRESENT, PREDOMINANTLY PMN NO ORGANISMS SEEN    Culture   Final    No growth aerobically or anaerobically. Performed at [facility], [place] [address] [place], [place] [address]    Report Status 06/22/2020 FINAL  Final  Aerobic/Anaerobic Culture (surgical/deep wound)     Status: None  Collection Time: 06/19/20 11:10 AM   Specimen: Abscess  Result Value Ref Range Status   Specimen Description   Final    ABSCESS LEFT LUMBAR FLUID/MYOSITIS Performed at [facility], [address], [place], [place] [address]    Special Requests   Final     NONE Performed at [facility], [address] [place], [place] [address]    Gram Stain   Final    MODERATE WBC PRESENT,BOTH PMN AND MONONUCLEAR NO ORGANISMS SEEN    Culture   Final    No growth aerobically or anaerobically. Performed at [facility], [place] [address] [place], [place] [address]    Report Status 06/24/2020 FINAL  Final         Radiology Studies: No results found.      Scheduled Meds: . chlorhexidine  15 mL Mouth Rinse BID  . Chlorhexidine Gluconate Cloth  6 each Topical Q0600  . enoxaparin (LOVENOX) injection  0.5 mg/kg Subcutaneous Q24H  . insulin aspart  0-15 Units Subcutaneous TID WC  . insulin aspart  0-5 Units Subcutaneous QHS  . insulin aspart  5 Units Subcutaneous TID WC  . insulin glargine  40 Units Subcutaneous Daily  . levothyroxine  150 mcg Oral Q0600  . magnesium oxide  400 mg Oral BID  . mouth rinse  15 mL Mouth Rinse q12n4p  . melatonin  5 mg Oral QHS  . pantoprazole  40 mg Oral Daily  . sodium chloride flush  10-40 mL Intracatheter Q12H  . thiamine  100 mg Oral Daily  . valACYclovir  1,000 mg Oral BID   Continuous Infusions: . sodium chloride 10 mL/hr at 06/25/20 0606  . cefTRIAXone (ROCEPHIN)  IV Stopped (06/25/20 0548)     LOS: 16 days    Time spent: 34 minutes    Barb Merino, MD [facility] Pager [phone]

## 2020-06-25 NOTE — Progress Notes (Addendum)
Physical Therapy Treatment Patient Details Name: Tonya Myers MRN: [id number] DOB: February 02, 1966 Today's Date: 06/25/2020    History of Present Illness Pt is a 55 y/o female admitted from home on 06/09/20 after presenting with AMS. Pt being treated for acute encephalopathy. 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 Pt also found to have paraspinal abscess, small epidural abscess, septic arthritis L2-L3 and L3-L4 with Neurosurgery & IR recommended serial imaging. Now s/p T3-4, T5-7, T9-10 laminectomy and evacuation of abscess (06/17/20); s/p IR drainage of paraspinal abscess later this date (06/19/20).  PMH: hypothyroidism, DDD, cervical radiculopathy, CLL, DM, HTN, HLD, anxiety, arthritis, CA, chronic back pain, asthma, depression, vertigo.    PT Comments    Planned and set up for attempts at lift off/standing this date; however, patient voices need to move bowels.  Unable to attend to anything else until addressed.  Discussed transfer techniques for transfer to toilet; unsafe to attempt physical transfer at this time due to restlessness, high distractibility at this time.  Will plan to continue efforts for standing and OOB transfers next date as appropriate.  Left in chair position end of session; boyfriend at bedside.  Encouraged upright positioning/chair position through lunch.  Patient/boyfriend voiced understanding.    Follow Up Recommendations  [name]     Equipment Recommendations  Rolling walker with 5" wheels;3in1 (PT)    Recommendations for Other Services       Precautions / Restrictions Precautions Precautions: Fall;Back Restrictions Weight Bearing Restrictions: No    Mobility  Bed Mobility Overal bed mobility: Needs Assistance Bed Mobility: Supine to Sit;Sit to Supine Rolling: Min guard   Supine to sit: Mod assist Sit to supine: Mod assist   General bed mobility comments: step by step cuing for log rolling, but improved active effort and performance this  date    Transfers                 General transfer comment: planned and set up for sit/stand attempts this date; unable to complete due to toileting needs  Ambulation/Gait             General Gait Details: unsafe/unable   Stairs             Wheelchair Mobility    Modified Rankin (Stroke Patients Only)       Balance Overall balance assessment: Needs assistance Sitting-balance support: No upper extremity supported;Feet supported Sitting balance-Leahy Scale: Fair Sitting balance - Comments: very restless in upright, sitting position; frequently shifting position, weight voicing need for BM.  Unable to redirect attention until toileting needs addressed                                    Cognition Arousal/Alertness: Awake/alert Behavior During Therapy: Flat affect Overall Cognitive Status: Impaired/Different from baseline                                 General Comments: difficulty attending to task, highly distractible by both internal and external environments; difficulty with task initiation and sequencing; generally perseverative on toileting needs      Exercises Other Exercises Other Exercises: Rolling bilat, min assist +1 for hygiene, peri-care and sling placement.  Fair carry-over of technique, UE/LE placement; less physical assist required than on previous attempts. Other Exercises: Supine/sit x2, mod assist +1 for truncal elevation and LE management. Other Exercises:  Toilet transfer, dep assist +2 via hoyer lift to/from [facility] (due to urgency of toileting needs, inability to attend to alternative transfer methods due to bowel urgency).  Able to maintain supported sitting posture on BSC with close sup; decreased restlessness, increased focus once bowels eliminated (continently once on [facility]).  Dep for hygiene.    General Comments        Pertinent Vitals/Pain Pain Assessment: Faces Faces Pain Scale: Hurts little more Pain  Location: back, neck Pain Descriptors / Indicators: Grimacing Pain Intervention(s): Limited activity within patient's tolerance;Repositioned;Monitored during session    Home Living                      Prior Function            PT Goals (current goals can now be found in the care plan section) Acute Rehab PT Goals Patient Stated Goal: to get better PT Goal Formulation: With patient/family Time For Goal Achievement: 07/03/20 Potential to Achieve Goals: Good Progress towards PT goals: Progressing toward goals    Frequency    Min 2X/week      PT Plan Current plan remains appropriate    Co-evaluation              AM-PAC PT "6 Clicks" Mobility   Outcome Measure  Help needed turning from your back to your side while in a flat bed without using bedrails?: A Little Help needed moving from lying on your back to sitting on the side of a flat bed without using bedrails?: A Lot Help needed moving to and from a bed to a chair (including a wheelchair)?: Total Help needed standing up from a chair using your arms (e.g., wheelchair or bedside chair)?: Total Help needed to walk in hospital room?: Total Help needed climbing 3-5 steps with a railing? : Total 6 Click Score: 9    End of Session Equipment Utilized During Treatment: Gait belt Activity Tolerance: Patient tolerated treatment well Patient left: in chair;with call bell/phone within reach;with chair alarm set Nurse Communication: Mobility status PT Visit Diagnosis: Unsteadiness on feet (R26.81);Difficulty in walking, not elsewhere classified (R26.2);Muscle weakness (generalized) (M62.81);Other symptoms and signs involving the nervous system (P53.748)     Time: 2707-8675 PT Time Calculation (min) (ACUTE ONLY): 45 min  Charges:  $Therapeutic Activity: 38-52 mins                     Maddax Palinkas H. Owens Shark, PT, DPT, NCS 06/25/20, 1:01 PM [phone]

## 2020-06-26 DIAGNOSIS — R7881 Bacteremia: Secondary | ICD-10-CM | POA: Diagnosis not present

## 2020-06-26 LAB — GLUCOSE, CAPILLARY
Glucose-Capillary: 180 mg/dL — ABNORMAL HIGH (ref 70–99)
Glucose-Capillary: 236 mg/dL — ABNORMAL HIGH (ref 70–99)
Glucose-Capillary: 150 mg/dL — ABNORMAL HIGH (ref 70–99)

## 2020-06-26 NOTE — Plan of Care (Signed)
End of Shift Summary:  Alert and oriented x3, reorientation required at times. Remained on room air overnight, sats >94%. Pain managed with prn medications. Q2T as allowed, UOP adequate via [place]. Remained free from falls or injury. Bed low and in locked position, bed alarm on. Call bell within reach and encouraged to use.     Problem: Education: Goal: Knowledge of General Education information will improve Description: Including pain rating scale, medication(s)/side effects and non-pharmacologic comfort measures Outcome: Progressing   Problem: Health Behavior/Discharge Planning: Goal: Ability to manage health-related needs will improve Outcome: Progressing   Problem: Clinical Measurements: Goal: Ability to maintain clinical measurements within normal limits will improve Outcome: Progressing Goal: Will remain free from infection Outcome: Progressing Goal: Diagnostic test results will improve Outcome: Progressing Goal: Respiratory complications will improve Outcome: Progressing Goal: Cardiovascular complication will be avoided Outcome: Progressing   Problem: Activity: Goal: Risk for activity intolerance will decrease Outcome: Progressing   Problem: Nutrition: Goal: Adequate nutrition will be maintained Outcome: Progressing   Problem: Coping: Goal: Level of anxiety will decrease Outcome: Progressing   Problem: Elimination: Goal: Will not experience complications related to bowel motility Outcome: Progressing Goal: Will not experience complications related to urinary retention Outcome: Progressing   Problem: Pain Managment: Goal: General experience of comfort will improve Outcome: Progressing   Problem: Safety: Goal: Ability to remain free from injury will improve Outcome: Progressing   Problem: Skin Integrity: Goal: Risk for impaired skin integrity will decrease Outcome: Progressing

## 2020-06-26 NOTE — PMR Pre-admission (Signed)
PMR Admission Coordinator Pre-Admission Assessment  Patient: Tonya Myers is an 55 y.o., female MRN: [id number] DOB: [birth year] Height: 6' (182.9 cm) Weight: 104.3 kg              Insurance Information HMO:     PPO:      PCP:      IPA:      80/20:      OTHER:  PRIMARY: Medicare a and b      Policy#: [id number]      Subscriber: pt Benefits:  Phone #: online     Name: 2/28 Eff. Date: 12/25/2009     Deduct: $1556      Out of Pocket Max: none      Life Max: none  [name]: 100%      SNF: 20 full days Outpatient: 80%     Co-Pay: 20% Home Health: 100%      Co-Pay: none DME: 80%     Co-Pay: 20% Providers: pt choice  SECONDARY: [employer] commercial      Policy#: [id number]  Financial Counselor:       Phone#:   The Data Collection Information Summary for patients in Inpatient Rehabilitation Facilities with attached Privacy Act [place] Records was provided and verbally reviewed with: Family  Emergency Contact Information Contact Information    Name Relation Home Work Mobile   Gita Kudo Significant other [phone]  [phone]   Heron Nay   [phone]   Shakeitha, [place] Daughter   [phone]     Current Medical History  Patient Admitting Diagnosis: spinal epidural abscess s/p surgery  History of Present Illness:  55 year old right-handed female with history of diabetes mellitus, CLL, chronic back pain, hypertension.  Presented 06/09/2020 with altered mental status and aphasia.  Patient had evidently presented earlier that day and left without being seen after having labs done.  She returned to the ED was unable to fully express why she was there with altered mental status noted.  Complaints of neck pain.  Cranial CT scan negative.  MRI normal brain.  EEG negative for seizure.  Chest x-ray no active disease.  Echocardiogram with ejection fraction of 50 to 55% no wall motion abnormalities.  Admission chemistries unremarkable aside glucose 274 creatinine 1.35,  WBC 15,500 hemoglobin 10.1, troponin negative, hemoglobin A1c 8.1.  TSH 5.742, alkaline phosphatase 200 total bilirubin 2.1 direct bilirubin 1.2 SARS coronavirus negative, ammonia negative.  Patient subsequently spiked a fever as well as maintain on insulin therapy suspect DKA presentation.  She was placed on broad-spectrum antibiotics for fever and concern of meningitis   She underwent LP.  CSF examination showed gram-positive diplococci.  MRI cervical lumbar thoracic spine showed findings compatible lumbar spinal infection.  There appeared to be septic arthritis in the left L2-3 and 3-4 facets with adjacent Paraspinous abscesses.  Ventral epidural abscess at L3-4 small but appears mildly progressive.  She was placed on broad-spectrum antibiotics for suspected meningitis.  Patient underwent T3-4 laminectomy, T5-7 laminectomy, 9-10 laminectomy evacuation of abscess 06/17/2020 per Dr. Lysle Morales with repeat lumbar paraspinal abscess aspiration by interventional radiology 06/19/2020.  Recommendations to continue ceftriaxone total of 6 weeks through 07/23/2020.Marland Kitchen  Patient was cleared to begin Lovenox for DVT prophylaxis.  Close monitoring of liver functions as they remained elevated but stable.    Past Medical History  Past Medical History:  Diagnosis Date   Acute hemorrhoid 03/11/2015   Anxiety    Arthritis    Asthma    Cancer (HCC)    Chronic  back pain    CLL (chronic lymphocytic leukemia) (HCC)    Depression    Diabetes mellitus without complication (HCC)    Genital herpes    type 2   Hypertension    Hypothyroidism    Vertigo     Family History  family history includes Breast cancer (age of onset: 64) in her maternal aunt; Cancer in her paternal aunt.  Prior Rehab/Hospitalizations:  Has the patient had prior rehab or hospitalizations prior to admission? Yes  Has the patient had major surgery during 100 days prior to admission? Yes  Current Medications   Current  Facility-Administered Medications:    0.9 %  sodium chloride infusion, , Intravenous, Continuous, Cherene Altes, MD, Last Rate: 10 mL/hr at 06/25/20 0606, New Bag at 06/25/20 0606   acetaminophen (TYLENOL) tablet 325 mg, 325 mg, Oral, Q6H PRN, 325 mg at 06/22/20 2201 **OR** [DISCONTINUED] acetaminophen (TYLENOL) suppository 650 mg, 650 mg, Rectal, Q6H PRN, Marcelyn Bruins, MD, 650 mg at 06/14/20 0931   cefTRIAXone (ROCEPHIN) 2 g in sodium chloride 0.9 % 100 mL IVPB, 2 g, Intravenous, Q12H, Ravishankar, Jayashree, MD, Last Rate: 200 mL/hr at 06/27/20 0528, 2 g at 06/27/20 0528   chlorhexidine (PERIDEX) 0.12 % solution 15 mL, 15 mL, Mouth Rinse, BID, Sharion Settler, NP, 15 mL at 06/27/20 0915   Chlorhexidine Gluconate Cloth 2 % PADS 6 each, 6 each, Topical, Q0600, Sharion Settler, NP, 6 each at 06/27/20 0537   dextrose 50 % solution 0-50 mL, 0-50 mL, Intravenous, PRN, Marcelyn Bruins, MD   enoxaparin (LOVENOX) injection 52.5 mg, 0.5 mg/kg, Subcutaneous, Q24H, Joette Catching T, MD, 52.5 mg at 06/27/20 0914   insulin aspart (novoLOG) injection 0-15 Units, 0-15 Units, Subcutaneous, TID WC, Cherene Altes, MD, 10 Units at 06/27/20 0900   insulin aspart (novoLOG) injection 0-5 Units, 0-5 Units, Subcutaneous, QHS, Cherene Altes, MD, 3 Units at 06/25/20 2133   insulin aspart (novoLOG) injection 5 Units, 5 Units, Subcutaneous, TID WC, Cherene Altes, MD, 5 Units at 06/27/20 0900   insulin glargine (LANTUS) injection 45 Units, 45 Units, Subcutaneous, Daily, Ghimire, Kuber, MD   labetalol (NORMODYNE) injection 10 mg, 10 mg, Intravenous, Q2H PRN, Sharion Settler, NP, 10 mg at 06/16/20 0954   levothyroxine (SYNTHROID) tablet 150 mcg, 150 mcg, Oral, Q0600, Marcelyn Bruins, MD, 150 mcg at 06/27/20 9470   magnesium oxide (MAG-OX) tablet 400 mg, 400 mg, Oral, BID, Barb Merino, MD, 400 mg at 06/27/20 0908   MEDLINE mouth rinse, 15 mL, Mouth Rinse, q12n4p, Sharion Settler, NP, 15 mL at 06/26/20 1626   melatonin tablet 5 mg, 5 mg, Oral, QHS, Sharion Settler, NP, 5 mg at 06/26/20 2038   oxyCODONE (Oxy IR/ROXICODONE) immediate release tablet 5 mg, 5 mg, Oral, Q4H PRN, Loletha Grayer, MD, 5 mg at 06/27/20 0908   pantoprazole (PROTONIX) EC tablet 40 mg, 40 mg, Oral, Daily, Joette Catching T, MD, 40 mg at 06/27/20 0914   polyethylene glycol (MIRALAX / GLYCOLAX) packet 17 g, 17 g, Oral, Daily PRN, Marcelyn Bruins, MD, 17 g at 06/27/20 9628   sodium chloride flush (NS) 0.9 % injection 10-40 mL, 10-40 mL, Intracatheter, Q12H, Wieting, Richard, MD, 10 mL at 06/26/20 2038   sodium chloride flush (NS) 0.9 % injection 10-40 mL, 10-40 mL, Intracatheter, PRN, Loletha Grayer, MD, 10 mL at 06/13/20 2210   thiamine tablet 100 mg, 100 mg, Oral, Daily, Cherene Altes, MD, 100 mg at 06/27/20 0908   valACYclovir (VALTREX)  tablet 1,000 mg, 1,000 mg, Oral, BID, Cherene Altes, MD, 1,000 mg at 06/27/20 0909  Patients Current Diet:  Diet Order            Diet - low sodium heart healthy           Diet Carb Modified           Diet regular Room service appropriate? Yes with Assist; Fluid consistency: Thin  Diet effective now                 Precautions / Restrictions Precautions Precautions: Fall,Back Precaution Comments: thoracic hemovacs Restrictions Weight Bearing Restrictions: No   Has the patient had 2 or more falls or a fall with injury in the past year?No  Prior Activity Level Community (5-7x/wk): independent, active, loves to fish, retired from [employer] Level of Independence: Independent Comments: clarified by [place]: Did the patient need help bathing, dressing, using the toilet or eating?  Independent  Indoor Mobility: Did the patient need assistance with walking from room to room (with or without device)? Independent  Stairs: Did the patient need assistance with internal or  external stairs (with or without device)? Independent  Functional Cognition: Did the patient need help planning regular tasks such as shopping or remembering to take medications? Independent  Home Assistive Devices / Equipment Home Assistive Devices/Equipment: None Home Equipment: Shower seat,Grab bars - toilet  Prior Device Use: Indicate devices/aids used by the patient prior to current illness, exacerbation or injury? None of the above  Current Functional Level Cognition  Overall Cognitive Status: Impaired/Different from baseline Orientation Level: Oriented to person General Comments: difficulty maintaining focus and attention; some word finding/expressive difficulties    Extremity Assessment (includes Sensation/Coordination)  Upper Extremity Assessment: Generalized weakness RUE Deficits / Details: grossly 3-/5 throughout shoulder, elbow, wrist and hand; limited ability to sustain activation; able to detect light touch and localize R/L LUE Deficits / Details: grossly 2+ to 3-/5 throughout shoulder, elbow, wrist and hand; limited ability to sustain activation; able to detect light touch and localize R/L  Lower Extremity Assessment: Generalized weakness RLE Deficits / Details: grossly 3- to 3 throughout, able to activate against manual resistance for gross hip/knee extension at times; intermittent difficulty with initiation/termination of isolated movement (slightly motor peseverative at times); able to detect light touch and accurately localize R/L LLE Deficits / Details: grossly 2+ to 3-/5 throughout, increased difficulty with motor planning and initiation/termination of isolated musculature; able to detect light touch and accurately localize R/L    ADLs  Overall ADL's : Needs assistance/impaired Grooming: Wash/dry face,Set up,Bed level Grooming Details (indicate cue type and reason): Following set-up assistance, pt able to bring washcloth to face with increased time and effort Upper  Body Dressing : Minimal assistance,Sitting Upper Body Dressing Details (indicate cue type and reason): Pt able to assist with threading arms through sleeves Lower Body Dressing: Maximal assistance,Bed level Lower Body Dressing Details (indicate cue type and reason): donning socks Toileting- Clothing Manipulation and Hygiene: Maximal assistance,Bed level,+2 for physical assistance Functional mobility during ADLs: Maximal assistance    Mobility  Overal bed mobility: Needs Assistance Bed Mobility: Supine to Sit,Sit to Supine Rolling: Min guard Supine to sit: Mod assist Sit to supine: Mod assist General bed mobility comments: Improved effort and performance in bed mobility    Transfers  Overall transfer level: Needs assistance Transfers: Lateral/Scoot Transfers  Lateral/Scoot Transfers: Max assist,+2 physical assistance General transfer comment: did not attempt  Ambulation / Gait / Stairs / Wheelchair Mobility  Ambulation/Gait General Gait Details: unsafe/unable    Posture / Balance Dynamic Sitting Balance Sitting balance - Comments: improved sitting balance Balance Overall balance assessment: Needs assistance Sitting-balance support: No upper extremity supported,Feet supported Sitting balance-Leahy Scale: Fair Sitting balance - Comments: improved sitting balance Standing balance comment: unsafe/unable    Special needs/care consideration Hgb A1c 8 Ceftriaxone I with LOT until 07/23/20 with PICC line   Previous Home Environment  Living Arrangements:  (Lives with [name], significant other)  Lives With: Significant other Available Help at Discharge: Family,Available 24 hours/day Type of Home: House Home Layout: Two level,Able to live on main level with bedroom/bathroom Alternate Level Stairs-Rails:  (Pt unable to recall) Alternate Level Stairs-Number of Steps: 16 Home Access: Level entry Bathroom Shower/Tub: Multimedia programmer: Standard Bathroom Accessibility:  Yes How Accessible: Accessible via walker Home Care Services: No  Discharge Living Setting Plans for Discharge Living Setting: Patient's home,Lives with (comment) ([name]) Type of Home at Discharge: House Discharge Home Layout: Two level,Able to live on main level with bedroom/bathroom Alternate Level Stairs-Number of Steps: 16 Discharge Home Access: Level entry Discharge Bathroom Shower/Tub: Walk-in shower Discharge Bathroom Toilet: Standard Discharge Bathroom Accessibility: Yes How Accessible: Accessible via walker Does the patient have any problems obtaining your medications?: No  Social/Family/Support Systems Contact Information: [name], boyfriend, 3 daughter in [place] Anticipated Caregiver: [place] Anticipated Ambulance person Information: see above Ability/Limitations of Caregiver: none Caregiver Availability: 24/7 Discharge Plan Discussed with Primary Caregiver: Yes Is Caregiver In Agreement with Plan?: Yes Does Caregiver/Family have Issues with Lodging/Transportation while Pt is in Rehab?: No  Goals Patient/Family Goal for Rehab: min assist with PT, OT, and SLP Expected length of stay: [facility] 3 to 4 weeks Pt/Family Agrees to Admission and willing to participate: Yes Program Orientation Provided & Reviewed with Pt/Caregiver Including Roles  & Responsibilities: Yes  Decrease burden of Care through IP rehab admission: n/a  Possible need for SNF placement upon discharge:not anticipated  Patient Condition: This patient's medical and functional status has changed since the consult dated: 06/24/2020 in which the Rehabilitation Physician determined and documented that the patient's condition is appropriate for intensive rehabilitative care in an inpatient rehabilitation facility. See "History of Present Illness" (above) for medical update. Functional changes are: pt max +2 for level transfers bed>chair. Patient's medical and functional status update has been discussed with the  Rehabilitation physician and patient remains appropriate for inpatient rehabilitation. Will admit to inpatient rehab today.  Preadmission Screen Completed By: Danne Baxter RN MSN with updates by Michel Santee, PT, 06/27/2020 10:24 AM ______________________________________________________________________   Discussed status with Dr. Ranell Patrick on 06/27/20 at 10:24 AM  and received approval for admission today.  Admission Coordinator: Danne Baxter RN MSN with updates by Michel Santee, PT, DPT time 10:24 AM Sudie Grumbling 06/27/20

## 2020-06-26 NOTE — Progress Notes (Addendum)
   Date of Admission:  06/09/2020     ID: Tonya Myers is a 55 y.o. female  Principal Problem:   Acute encephalopathy Active Problems:   CLL (chronic lymphocytic leukemia) (HCC)   Hypothyroidism   Essential hypertension   Uncontrolled type 2 diabetes mellitus with hyperglycemia, without long-term current use of insulin (HCC)   Aphasia   Hyperglycemic crisis in diabetes mellitus ([place])   Acute metabolic encephalopathy   Severe sepsis (HCC)   Pneumococcal meningitis   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HCC)   Epidural abscess   Hypernatremia   Hypomagnesemia   Obesity (BMI 30.0-34.9)     Medications:  . chlorhexidine  15 mL Mouth Rinse BID  . Chlorhexidine Gluconate Cloth  6 each Topical Q0600  . enoxaparin (LOVENOX) injection  0.5 mg/kg Subcutaneous Q24H  . insulin aspart  0-15 Units Subcutaneous TID WC  . insulin aspart  0-5 Units Subcutaneous QHS  . insulin aspart  5 Units Subcutaneous TID WC  . insulin glargine  40 Units Subcutaneous Daily  . levothyroxine  150 mcg Oral Q0600  . magnesium oxide  400 mg Oral BID  . mouth rinse  15 mL Mouth Rinse q12n4p  . melatonin  5 mg Oral QHS  . pantoprazole  40 mg Oral Daily  . sodium chloride flush  10-40 mL Intracatheter Q12H  . thiamine  100 mg Oral Daily  . valACYclovir  1,000 mg Oral BID    Objective: Vital signs in last 24 hours: Patient Vitals for the past 24 hrs:  BP Temp Temp src Pulse Resp SpO2  06/26/20 1138 137/81 99.9 F (37.7 C) Oral (!) 110 15 100 %  06/26/20 0759 (!) 153/84 98 F (36.7 C) Oral 91 15 -  06/26/20 0530 (!) 143/75 98.8 F (37.1 C) - 90 18 98 %  06/25/20 2309 140/82 98.3 F (36.8 C) - 98 18 100 %  06/25/20 2011 (!) 144/78 98.9 F (37.2 C) Oral 90 18 99 %  06/25/20 1541 (!) 142/86 97.6 F (36.4 C) - 97 17 -    Lab Results Recent Labs    06/24/20 0613 06/25/20 0616  WBC 7.0 7.6  HGB 8.0* 8.2*  HCT 25.0* 26.6*  NA 136 137  K 4.0 4.0  CL 105 106  CO2 24 22  BUN 10 10  CREATININE 0.57 0.59    Liver Panel Recent Labs    06/25/20 0616  PROT 6.2*  ALBUMIN 2.2*  AST 62*  ALT 92*  ALKPHOS 231*  BILITOT 0.7   Sedimentation Rate Recent Labs    06/25/20 0616  ESRSEDRATE 128*   C-Reactive Protein Recent Labs    06/25/20 0616  CRP 11.7*    Microbiology: 06/10/2020 CSF culture Streptococcus pneumonia 05/29/2020 blood culture Streptococcus pneumoniae 06/12/2020 blood culture no growth 06/17/20 Epidural abscess culture-no growth 06/19/20 paraspinal abscess culture negative    Assessment/Plan: Strep pneumo meningitis Strep pneumo bacteremia Extensive thoracic epidural abscess.  Underwent multilevel laminectomies and drainage of abscess.  She had 3 drains which have all been removed.  Lumbar paraspinal abscess has been drained as well.  She is currently on ceftriaxone 2 g IV every 12 hours.  Will need antibiotics until 07/23/2020.  Diabetes mellitus which on presentation was uncontrolled and she had DKA. Improved  Abnormal LFTs improving  AKI resolved  Anemia  CLL in remission  Hypothyroidism on Synthroid.   Discussed the management with the care team.

## 2020-06-26 NOTE — Progress Notes (Signed)
PROGRESS NOTE    Tonya Myers  OFB:[id number] DOB: 1965/06/25 DOA: 06/09/2020 PCP: [name], MD    Brief Narrative:  55 year old lady with history of type 2 diabetes on insulin, CLL, chronic back pain presented to hospital on 2/14 with altered mental status and unable to to talk.  MRI brain was negative for acute a stroke.  She was found to be in DKA and also suffering from acute kidney injury. Hospital course complicated as she subsequently became febrile.  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  She was also found to have paraspinal abscesses that were treated with surgical incision.  Remains in the hospital and waiting for rehab.   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 Neurosurgery to OR for decompression on 06/17/20 where patient underwent T3/4 laminectomy, T5/7 laminectomy, T9/10 laminectomy and evacuation of abscess.  Repeat lumbar paraspinal abscess aspiration by IR on 2/24. Followed by infectious disease, currently on ceftriaxone with total plan for 6 weeks until 3/30. Followed by neurosurgery for postoperative plans. Mobility.  Weightbearing as tolerated.  Continue aggressive rehab and referred to [name].  Has a PICC line on right arm for antibiotic.  Remove when she finishes antibiotics.  Acute infective metabolic encephalopathy: As above.  Neurologically  improving.  Acute kidney injury: On presentation.  Improved and resolved.  Anemia of chronic disease: Multifactorial.  Stable.  No indication for transfusion.  Uncontrolled type 2 diabetes with DKA on presentation: Hemoglobin A1c 8.  She had hypoglycemic episodes with poor appetite.  Now her blood sugars are more than 200 consistently.  We will gradually increase the dose of insulin.  On magnesium replacement.   Hypertension: Blood pressures are fairly stable.   DVT prophylaxis: Place and maintain sequential compression device Start: 06/18/20 1650   Code Status: Full code Family Communication: Significant other at the bedside Disposition Plan: Status is: Inpatient  Remains inpatient appropriate because:Unsafe d/c plan   Dispo: The patient is from: Home              Anticipated d/c is to: [name]              Patient currently is medically stable to d/c.   Difficult to place patient No         Consultants:   Neurosurgery  Infectious disease    Procedures:   Multiple procedures as above  Antimicrobials:  Antibiotics Given (last 72 hours)    Date/Time Action Medication Dose Rate   06/23/20 1631 New Bag/Given   cefTRIAXone (ROCEPHIN) 2 g in sodium chloride 0.9 % 100 mL IVPB 2 g 200 mL/hr   06/23/20 2114 Given   valACYclovir (VALTREX) tablet 1,000 mg 1,000 mg    06/24/20 0545 New Bag/Given   cefTRIAXone (ROCEPHIN) 2 g in sodium chloride 0.9 % 100 mL IVPB 2 g 200 mL/hr   06/24/20 0823 Given   valACYclovir (VALTREX) tablet 1,000 mg 1,000 mg    06/24/20 1801 New Bag/Given  cefTRIAXone (ROCEPHIN) 2 g in sodium chloride 0.9 % 100 mL IVPB 2 g 200 mL/hr   06/24/20 2232 Given   valACYclovir (VALTREX) tablet 1,000 mg 1,000 mg    06/25/20 0518 New Bag/Given   cefTRIAXone (ROCEPHIN) 2 g in sodium chloride 0.9 % 100 mL IVPB 2 g 200 mL/hr   06/25/20 1026 Given   valACYclovir (VALTREX) tablet 1,000 mg 1,000 mg    06/25/20 1751 New Bag/Given   cefTRIAXone (ROCEPHIN) 2 g in  sodium chloride 0.9 % 100 mL IVPB 2 g 200 mL/hr   06/25/20 2121 Given   valACYclovir (VALTREX) tablet 1,000 mg 1,000 mg    06/26/20 0446 New Bag/Given   cefTRIAXone (ROCEPHIN) 2 g in sodium chloride 0.9 % 100 mL IVPB 2 g 200 mL/hr   06/26/20 0957 Given   valACYclovir (VALTREX) tablet 1,000 mg 1,000 mg          Subjective: Patient seen and examined.  No overnight events.  Pain is controlled with oral pain medication.  Significant other at the bedside.  Objective: Vitals:   06/25/20 2309 06/26/20 0530 06/26/20 0759 06/26/20 1138  BP: 140/82 (!) 143/75 (!) 153/84 137/81  Pulse: 98 90 91 (!) 110  Resp: 18 18 15 15   Temp: 98.3 F (36.8 C) 98.8 F (37.1 C) 98 F (36.7 C) 99.9 F (37.7 C)  TempSrc:   Oral Oral  SpO2: 100% 98%  100%  Weight:      Height:        Intake/Output Summary (Last 24 hours) at 06/26/2020 1349 Last data filed at 06/26/2020 1050 Gross per 24 hour  Intake 1024 ml  Output 650 ml  Net 374 ml   Filed Weights   06/17/20 1621 06/18/20 0500 06/19/20 0500  Weight: 109.1 kg 104.6 kg 104.3 kg    Examination:  General exam: Chronically sick looking.  Not in any distress.  Looks fairly comfortable on room air. Respiratory system: Bilateral clear. Cardiovascular system: S1 & S2 heard, no added sounds.   Gastrointestinal system: Soft and nontender.  Bowel sounds present.. Central nervous system: Alert and oriented. No focal neurological deficits. Extremities: Patient has generalized weakness both lower extremities. Patient has multiple incisions with intact sutures on her back.    Data Reviewed: I have personally reviewed following labs and imaging studies  CBC: Recent Labs  Lab 06/20/20 0600 06/21/20 0532 06/22/20 0601 06/24/20 0613 06/25/20 0616  WBC 12.1* 10.1 8.0 7.0 7.6  HGB 8.3* 8.1* 7.7* 8.0* 8.2*  HCT 27.1* 26.6* 24.8* 25.0* 26.6*  MCV 82.9 83.1 82.1 80.9 81.3  PLT 436* 468* 466* 498* 188*   Basic Metabolic Panel: Recent Labs  Lab  06/21/20 0532 06/22/20 0601 06/23/20 0500 06/23/20 0630 06/24/20 0613 06/25/20 0616  NA 140 136  --  140 136 137  K 4.0 3.4*  --  3.0* 4.0 4.0  CL 107 107  --  111 105 106  CO2 26 22  --  21* 24 22  GLUCOSE 140* 215*  --  98 194* 130*  BUN 18 17  --  12 10 10   CREATININE 0.89 0.81  --  0.61 0.57 0.59  CALCIUM 8.1* 7.6*  --  6.6* 7.8* 8.1*  MG 1.7  --  1.3*  --  1.8 1.6*  PHOS  --   --   --   --  2.2*  --    GFR: Estimated Creatinine Clearance: 106.1 mL/min (by C-G formula based on SCr of 0.59 mg/dL). Liver Function Tests:  Recent Labs  Lab 06/20/20 0600 06/21/20 0532 06/22/20 0601 06/23/20 0630 06/25/20 0616  AST 91* 198* 131* 105* 62*  ALT 57* 116* 109* 95* 92*  ALKPHOS 215* 245* 237* 203* 231*  BILITOT 0.7 0.9 0.5 0.5 0.7  PROT 5.9* 6.1* 5.8* 4.9* 6.2*  ALBUMIN 2.0* 2.0* 1.9* 1.6* 2.2*   No results for input(s): LIPASE, AMYLASE in the last 168 hours. Recent Labs  Lab 06/20/20 0545 06/21/20 0532  AMMONIA <9* 10   Coagulation Profile: No results for input(s): INR, PROTIME in the last 168 hours. Cardiac Enzymes: No results for input(s): CKTOTAL, CKMB, CKMBINDEX, TROPONINI in the last 168 hours. BNP (last 3 results) No results for input(s): PROBNP in the last 8760 hours. HbA1C: No results for input(s): HGBA1C in the last 72 hours. CBG: Recent Labs  Lab 06/25/20 1211 06/25/20 1538 06/25/20 2129 06/26/20 0913 06/26/20 1140  GLUCAP 271* 220* 293* 180* 236*   Lipid Profile: No results for input(s): CHOL, HDL, LDLCALC, TRIG, CHOLHDL, LDLDIRECT in the last 72 hours. Thyroid Function Tests: No results for input(s): TSH, T4TOTAL, FREET4, T3FREE, THYROIDAB in the last 72 hours. Anemia Panel: No results for input(s): VITAMINB12, FOLATE, FERRITIN, TIBC, IRON, RETICCTPCT in the last 72 hours. Sepsis Labs: No results for input(s): PROCALCITON, LATICACIDVEN in the last 168 hours.  Recent Results (from the past 240 hour(s))  Aerobic/Anaerobic Culture w Gram Stain  (surgical/deep wound)     Status: None   Collection Time: 06/17/20  5:52 PM   Specimen: PATH Other; Body Fluid  Result Value Ref Range Status   Specimen Description   Final    WOUND Performed at [facility], [address] [place], [place] [address]    Special Requests   Final    EPIDURAL Performed at [facility], [address] [place], [place] [address]    Gram Stain   Final    ABUNDANT WBC PRESENT, PREDOMINANTLY PMN NO ORGANISMS SEEN    Culture   Final    No growth aerobically or anaerobically. Performed at [facility], [place] [address] [place], [place] [address]    Report Status 06/22/2020 FINAL  Final  Aerobic/Anaerobic Culture w Gram Stain (surgical/deep wound)     Status: None   Collection Time: 06/17/20  5:55 PM   Specimen: PATH Other; Body Fluid  Result Value Ref Range Status   Specimen Description   Final    WOUND Performed at [facility], [address] [place], [place] [address]    Special Requests   Final    THORACIC Performed at [facility], [address] [place], [place] [address]    Gram Stain   Final    RARE WBC PRESENT, PREDOMINANTLY PMN NO ORGANISMS SEEN    Culture   Final    No growth aerobically or anaerobically. Performed at [facility], [place] [address], [place], [place] [address]    Report Status 06/22/2020 FINAL  Final  Aerobic/Anaerobic Culture (surgical/deep wound)     Status: None   Collection Time: 06/19/20 11:10 AM   Specimen: Abscess  Result Value Ref Range Status   Specimen Description   Final    ABSCESS LEFT LUMBAR FLUID/MYOSITIS Performed at [facility], [address] [place], [place] [address]    Special Requests   Final    NONE Performed at [facility], [address], [place], [place] [address]    Gram Stain   Final    MODERATE WBC  PRESENT,BOTH PMN AND MONONUCLEAR NO ORGANISMS SEEN    Culture   Final    No growth aerobically or  anaerobically. Performed at [facility], [place] [address], [place], [place] [address]    Report Status 06/24/2020 FINAL  Final         Radiology Studies: No results found.      Scheduled Meds: . chlorhexidine  15 mL Mouth Rinse BID  . Chlorhexidine Gluconate Cloth  6 each Topical Q0600  . enoxaparin (LOVENOX) injection  0.5 mg/kg Subcutaneous Q24H  . insulin aspart  0-15 Units Subcutaneous TID WC  . insulin aspart  0-5 Units Subcutaneous QHS  . insulin aspart  5 Units Subcutaneous TID WC  . insulin glargine  40 Units Subcutaneous Daily  . levothyroxine  150 mcg Oral Q0600  . magnesium oxide  400 mg Oral BID  . mouth rinse  15 mL Mouth Rinse q12n4p  . melatonin  5 mg Oral QHS  . pantoprazole  40 mg Oral Daily  . sodium chloride flush  10-40 mL Intracatheter Q12H  . thiamine  100 mg Oral Daily  . valACYclovir  1,000 mg Oral BID   Continuous Infusions: . sodium chloride 10 mL/hr at 06/25/20 0606  . cefTRIAXone (ROCEPHIN)  IV Stopped (06/26/20 0517)     LOS: 17 days    Time spent: 30 minutes    Barb Merino, MD [facility] Pager [phone]

## 2020-06-26 NOTE — Care Management Important Message (Signed)
Important Message  Patient Details  Name: Tonya Myers MRN: [id number] Date of Birth: 07/16/1965   Medicare Important Message Given:  Yes     Juliann Pulse A Priyah Schmuck 06/26/2020, 12:25 PM

## 2020-06-26 NOTE — Progress Notes (Signed)
Inpatient Rehabilitation Admissions Coordinator  No [name] bed is available for her admit today. I have alerted acute team and TOC.  Danne Baxter, RN, MSN Rehab Admissions Coordinator [phone] 06/26/2020 2:31 PM

## 2020-06-26 NOTE — H&P (Incomplete)
Physical Medicine and Rehabilitation Admission H&P    Chief Complaint  Patient presents with  . Back Pain  : HPI: Tonya Myers is a 55 year old right-handed female with history of diabetes mellitus, CLL, chronic back pain, hypertension.  Per chart review patient lives with spouse.  Two-level home bed and bath main level.  Independent prior to admission.  Presented 06/09/2020 with altered mental status and aphasia.  Patient had evidently presented earlier that day and left without being seen after having labs done.  She returned to the ED was unable to fully express why she was there with altered mental status noted.  Complaints of neck pain.  Cranial CT scan negative.  MRI normal brain.  EEG negative for seizure.  Chest x-ray no active disease.  Echocardiogram with ejection fraction of 50 to 55% no wall motion abnormalities.  Admission chemistries unremarkable aside glucose 274 creatinine 1.35, WBC 15,500 hemoglobin 10.1, troponin negative, hemoglobin A1c 8.1.  TSH 5.742, alkaline phosphatase 200 total bilirubin 2.1 direct bilirubin 1.2 SARS coronavirus negative, ammonia negative.  Patient subsequently spiked a fever as well as maintain on insulin therapy suspect DKA presentation.  She was placed on broad-spectrum antibiotics for fever and concern of meningitis   She underwent LP.  CSF examination showed gram-positive diplococci.  MRI cervical lumbar thoracic spine showed findings compatible lumbar spinal infection.  There appeared to be septic arthritis in the left L2-3 and 3-4 facets with adjacent paraspinous abscesses.  Ventral epidural abscess at L3-4 small but appears mildly progressive.  She was placed on broad-spectrum antibiotics for suspected meningitis.  Patient underwent T3-4 laminectomy, T5-7 laminectomy, 9-10 laminectomy evacuation of abscess 06/17/2020 per Dr. Lysle Morales with repeat lumbar paraspinal abscess aspiration by interventional radiology 06/19/2020.  Recommendations to continue  ceftriaxone total of 6 weeks through 07/23/2020.Marland Kitchen  Patient was cleared to begin Lovenox for DVT prophylaxis.  Close monitoring of liver functions as they remained elevated but stable.  Due to patient's overall decrease in functional ability and need for physical assistance she was admitted for a comprehensive rehab program.  Review of Systems  Constitutional: Positive for fever and malaise/fatigue. Negative for chills.  HENT: Negative for hearing loss.   Eyes: Negative for blurred vision and double vision.  Respiratory: Negative for cough and shortness of breath.   Cardiovascular: Positive for leg swelling. Negative for chest pain and palpitations.  Gastrointestinal: Positive for constipation. Negative for heartburn, nausea and vomiting.  Genitourinary: Negative for dysuria, flank pain and hematuria.  Musculoskeletal: Positive for back pain, joint pain and myalgias.  Skin: Negative for rash.  Neurological: Positive for weakness and headaches.  Psychiatric/Behavioral: Positive for depression. The patient has insomnia.        Anxiety  All other systems reviewed and are negative.  Past Medical History:  Diagnosis Date  . Acute hemorrhoid 03/11/2015  . Anxiety   . Arthritis   . Asthma   . Cancer ([place])   . Chronic back pain   . CLL (chronic lymphocytic leukemia) ([place])   . Depression   . Diabetes mellitus without complication ([place])   . Genital herpes    type 2  . Hypertension   . Hypothyroidism   . Vertigo    Past Surgical History:  Procedure Laterality Date  . ABDOMINAL HYSTERECTOMY    . BILATERAL SALPINGOOPHORECTOMY  2009  . BREAST BIOPSY Right 05/17/2016   FIBROADENOMATOUS CHANGE AND SCLEROSING ADENOSIS WITH  . COLONOSCOPY WITH PROPOFOL N/A 06/16/2015   Procedure: COLONOSCOPY WITH PROPOFOL;  Surgeon:  [name], MD;  Location: [facility] ENDOSCOPY;  Service: Endoscopy;  Laterality: N/A;  . LAPAROSCOPIC SUPRACERVICAL HYSTERECTOMY  2009   due to [place]  . OOPHORECTOMY    .  THORACIC LAMINECTOMY FOR EPIDURAL ABSCESS Bilateral 06/17/2020   Procedure: THORACIC LAMINECTOMY FOR EPIDURAL ABSCESS;  Surgeon: Deetta Perla, MD;  Location: [facility] ORS;  Service: Neurosurgery;  Laterality: Bilateral;   Family History  Problem Relation Age of Onset  . Cancer Paternal Aunt   . Breast cancer Maternal Aunt 70   Social History:  reports that she has never smoked. She has never used smokeless tobacco. She reports current alcohol use of about 1.0 standard drink of alcohol per week. She reports that she does not use drugs. Allergies: No Known Allergies Medications Prior to Admission  Medication Sig Dispense Refill  . cyclobenzaprine (FLEXERIL) 10 MG tablet Take 10 mg by mouth 3 (three) times daily.    Marland Kitchen diltiazem (DILACOR XR) 120 MG 24 hr capsule Take 120 mg by mouth daily.     Marland Kitchen estradiol (ESTRACE) 0.5 MG tablet Take 0.5 mg by mouth daily.  5  . ezetimibe (ZETIA) 10 MG tablet Take 10 mg by mouth daily.     . furosemide (LASIX) 20 MG tablet Take 20 mg by mouth daily.    . Insulin Glargine-Lixisenatide 100-33 UNT-MCG/ML SOPN Inject 48 Units into the skin daily before breakfast.    . insulin lispro (HUMALOG) 200 UNIT/ML KwikPen Inject 100 Units into the skin daily. Sliding scale average of 10 units    . levocetirizine (XYZAL) 5 MG tablet Take 5 mg by mouth at bedtime.    Marland Kitchen levothyroxine (SYNTHROID, LEVOTHROID) 150 MCG tablet Take 150 mcg by mouth daily before breakfast.     . metFORMIN (GLUCOPHAGE-XR) 500 MG 24 hr tablet Take 500 mg by mouth 2 (two) times daily.    . montelukast (SINGULAIR) 10 MG tablet Take 10 mg by mouth at bedtime.    . naproxen (NAPROSYN) 500 MG tablet Take 500 mg by mouth 2 (two) times daily.    . pregabalin (LYRICA) 75 MG capsule Take 75 mg by mouth 2 (two) times daily.    . rosuvastatin (CRESTOR) 10 MG tablet Take 10 mg by mouth at bedtime.    Marland Kitchen telmisartan-hydrochlorothiazide (MICARDIS HCT) 80-25 MG tablet Take 1 tablet by mouth daily.    . valACYclovir  (VALTREX) 1000 MG tablet Take 1 tablet (1,000 mg total) by mouth 2 (two) times daily. 60 tablet 1    Drug Regimen Review { DRUG REGIMEN [id number]}  Home: Home Living Family/patient expects to be discharged to:: Private residence Living Arrangements: Spouse/significant other Available Help at Discharge: Family,Available 24 hours/day Type of Home: House Home Access: Level entry Home Layout: Two level,Able to live on main level with bedroom/bathroom Alternate Level Stairs-Number of Steps: 16 Alternate Level Stairs-Rails:  (Pt unable to recall) Bathroom Shower/Tub: Multimedia programmer: Standard Home Equipment: Shower seat,Grab bars - toilet   Functional History: Prior Function Level of Independence: Independent Comments: Data on living situation and PLOF taken from chart review. Pt unable at this time to communicate this information.  Functional Status:  Mobility: Bed Mobility Overal bed mobility: Needs Assistance Bed Mobility: Supine to Sit,Sit to Supine Rolling: Min guard Supine to sit: Mod assist Sit to supine: Mod assist General bed mobility comments: Improved effort and performance in bed mobility Transfers Overall transfer level: Needs assistance Transfers: Lateral/Scoot Transfers  Lateral/Scoot Transfers: Max assist,+2 physical assistance General transfer comment: did not attempt  Ambulation/Gait General Gait Details: unsafe/unable    ADL: ADL Overall ADL's : Needs assistance/impaired Grooming: Wash/dry face,Set up,Bed level Grooming Details (indicate cue type and reason): Following set-up assistance, pt able to bring washcloth to face with increased time and effort Upper Body Dressing : Minimal assistance,Sitting Upper Body Dressing Details (indicate cue type and reason): Pt able to assist with threading arms through sleeves Lower Body Dressing: Maximal assistance,Bed level Lower Body Dressing Details (indicate cue type and reason): donning  socks Toileting- Clothing Manipulation and Hygiene: Maximal assistance,Bed level,+2 for physical assistance Functional mobility during ADLs: Maximal assistance  Cognition: Cognition Overall Cognitive Status: Impaired/Different from baseline Orientation Level: Oriented to person,Oriented to place,Disoriented to situation,Oriented to time Cognition Arousal/Alertness: Awake/alert Behavior During Therapy: Flat affect Overall Cognitive Status: Impaired/Different from baseline General Comments: difficulty maintaining focus and attention; some word finding/expressive difficulties  Physical Exam: Blood pressure 137/81, pulse (!) 110, temperature 99.9 F (37.7 C), temperature source Oral, resp. rate 15, height 6' (1.829 m), weight 104.3 kg, SpO2 100 %. Physical Exam Skin:    Comments: Back incision dressing intact.  Neurological:     Comments: Patient is alert.  No acute distress.  Mood is a bit flat but appropriate.  Provides name and age.  Follows simple commands.     Results for orders placed or performed during the hospital encounter of 06/09/20 (from the past 48 hour(s))  Glucose, capillary     Status: Abnormal   Collection Time: 06/24/20  5:07 PM  Result Value Ref Range   Glucose-Capillary 210 (H) 70 - 99 mg/dL    Comment: Glucose reference range applies only to samples taken after fasting for at least 8 hours.  Glucose, capillary     Status: Abnormal   Collection Time: 06/24/20  9:11 PM  Result Value Ref Range   Glucose-Capillary 185 (H) 70 - 99 mg/dL    Comment: Glucose reference range applies only to samples taken after fasting for at least 8 hours.  Comprehensive metabolic panel     Status: Abnormal   Collection Time: 06/25/20  6:16 AM  Result Value Ref Range   Sodium 137 135 - 145 mmol/L   Potassium 4.0 3.5 - 5.1 mmol/L   Chloride 106 98 - 111 mmol/L   CO2 22 22 - 32 mmol/L   Glucose, Bld 130 (H) 70 - 99 mg/dL    Comment: Glucose reference range applies only to samples  taken after fasting for at least 8 hours.   BUN 10 6 - 20 mg/dL   Creatinine, Ser 0.59 0.44 - 1.00 mg/dL   Calcium 8.1 (L) 8.9 - 10.3 mg/dL   Total Protein 6.2 (L) 6.5 - 8.1 g/dL   Albumin 2.2 (L) 3.5 - 5.0 g/dL   AST 62 (H) 15 - 41 U/L   ALT 92 (H) 0 - 44 U/L   Alkaline Phosphatase 231 (H) 38 - 126 U/L   Total Bilirubin 0.7 0.3 - 1.2 mg/dL   GFR, Estimated >60 >60 mL/min    Comment: (NOTE) Calculated using the CKD-EPI Creatinine Equation (2021)    Anion gap 9 5 - 15    Comment: Performed at [facility], [address] [place], [place] [address]  Magnesium     Status: Abnormal   Collection Time: 06/25/20  6:16 AM  Result Value Ref Range   Magnesium 1.6 (L) 1.7 - 2.4 mg/dL    Comment: Performed at [facility], [address], [place], [place] [address]  CBC  Status: Abnormal   Collection Time: 06/25/20  6:16 AM  Result Value Ref Range   WBC 7.6 4.0 - 10.5 K/uL   RBC 3.27 (L) 3.87 - 5.11 MIL/uL   Hemoglobin 8.2 (L) 12.0 - 15.0 g/dL   HCT 26.6 (L) 36.0 - 46.0 %   MCV 81.3 80.0 - 100.0 fL   MCH 25.1 (L) 26.0 - 34.0 pg   MCHC 30.8 30.0 - 36.0 g/dL   RDW 16.6 (H) 11.5 - 15.5 %   Platelets 495 (H) 150 - 400 K/uL   nRBC 0.0 0.0 - 0.2 %    Comment: Performed at [facility], [address] [place], [place] [address]  Sedimentation rate     Status: Abnormal   Collection Time: 06/25/20  6:16 AM  Result Value Ref Range   Sed Rate 128 (H) 0 - 30 mm/hr    Comment: Performed at [facility], [address] [place], [place] [address]  C-reactive protein     Status: Abnormal   Collection Time: 06/25/20  6:16 AM  Result Value Ref Range   CRP 11.7 (H) <1.0 mg/dL    Comment: Performed at [facility], [place] [address] [place], [place] [address]  Glucose, capillary     Status: Abnormal   Collection Time: 06/25/20  8:14 AM  Result Value Ref Range   Glucose-Capillary 174 (H) 70 - 99 mg/dL    Comment: Glucose reference range applies only  to samples taken after fasting for at least 8 hours.  Glucose, capillary     Status: Abnormal   Collection Time: 06/25/20 12:11 PM  Result Value Ref Range   Glucose-Capillary 271 (H) 70 - 99 mg/dL    Comment: Glucose reference range applies only to samples taken after fasting for at least 8 hours.  Glucose, capillary     Status: Abnormal   Collection Time: 06/25/20  3:38 PM  Result Value Ref Range   Glucose-Capillary 220 (H) 70 - 99 mg/dL    Comment: Glucose reference range applies only to samples taken after fasting for at least 8 hours.  Glucose, capillary     Status: Abnormal   Collection Time: 06/25/20  9:29 PM  Result Value Ref Range   Glucose-Capillary 293 (H) 70 - 99 mg/dL    Comment: Glucose reference range applies only to samples taken after fasting for at least 8 hours.  Glucose, capillary     Status: Abnormal   Collection Time: 06/26/20  9:13 AM  Result Value Ref Range   Glucose-Capillary 180 (H) 70 - 99 mg/dL    Comment: Glucose reference range applies only to samples taken after fasting for at least 8 hours.  Glucose, capillary     Status: Abnormal   Collection Time: 06/26/20 11:40 AM  Result Value Ref Range   Glucose-Capillary 236 (H) 70 - 99 mg/dL    Comment: Glucose reference range applies only to samples taken after fasting for at least 8 hours.   No results found.     Medical Problem List and Plan: 1.  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  Status post T3-4, T5-7, T9-10 laminectomy with evacuation of abscess with repeat lumbar paraspinal abscess aspiration by interventional radiology 06/19/2020.  Orders to remove sutures 07/01/2020.  -patient may *** shower  -ELOS/Goals: *** 2.  Antithrombotics: -DVT/anticoagulation: Lovenox 52.5 mg daily.  Check vascular studies  -antiplatelet therapy: N/A 3. Pain Management: Oxycodone as needed 4. Mood: Melatonin  5 mg  nightly  -antipsychotic agents: N/A 5. Neuropsych: This patient is capable of making decisions on her own behalf. 6. Skin/Wound Care: Routine skin checks 7. Fluids/Electrolytes/Nutrition: Routine in and outs with follow-up chemistries 8.  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  Continue Rocephin through 07/23/2020 and follow-up infectious disease. 9.  Diabetes mellitus/DKA.  Hemoglobin A1c 8.0.  NovoLog 5 units 3 times daily, Lantus insulin 40 units daily.  Check blood sugars before meals and at bedtime 10.  CLL.  Follow-up outpatient Dr. Delight Hoh 11.  Hypothyroidism.  Synthroid 12.  Anemia of chronic disease.  Multifactorial.  Follow-up CBC 13.  Hypertension.  Patient on Micardis prior to admission.  20-25 mg, Dilacor 120 mg daily.  Resume as needed 14.  Transaminitis.  Follow-up chemistries 15.  Obesity.  BMI 31.19.  Dietary follow-up 16.  History of asthma.  Patient on Singulair 10 mg nightly as well as Symbicort 160-4.5 mcg.  Resume as needed 17.  History of genital herpes.  Continue Valtrex ***  Cathlyn Parsons, PA-C 06/26/2020

## 2020-06-27 ENCOUNTER — Inpatient Hospital Stay (HOSPITAL_COMMUNITY)
Admission: RE | Admit: 2020-06-27 | Discharge: 2020-07-31 | DRG: 945 | Disposition: A | Discharge status: Home Health | Payer: Medicare Other | Source: Other Acute Inpatient Hospital | Attending: Physical Medicine & Rehabilitation | Admitting: Physical Medicine & Rehabilitation

## 2020-06-27 ENCOUNTER — Encounter (HOSPITAL_COMMUNITY): Payer: Self-pay | Admitting: Physical Medicine & Rehabilitation

## 2020-06-27 ENCOUNTER — Other Ambulatory Visit: Payer: Self-pay

## 2020-06-27 DIAGNOSIS — M10061 Idiopathic gout, right knee: Secondary | ICD-10-CM | POA: Diagnosis present

## 2020-06-27 DIAGNOSIS — E1100 Type 2 diabetes mellitus with hyperosmolarity without nonketotic hyperglycemic-hyperosmolar coma (NKHHC): Secondary | ICD-10-CM

## 2020-06-27 DIAGNOSIS — E669 Obesity, unspecified: Secondary | ICD-10-CM | POA: Diagnosis present

## 2020-06-27 DIAGNOSIS — M5136 Other intervertebral disc degeneration, lumbar region: Secondary | ICD-10-CM | POA: Diagnosis present

## 2020-06-27 DIAGNOSIS — G4700 Insomnia, unspecified: Secondary | ICD-10-CM | POA: Diagnosis present

## 2020-06-27 DIAGNOSIS — G479 Sleep disorder, unspecified: Secondary | ICD-10-CM | POA: Diagnosis not present

## 2020-06-27 DIAGNOSIS — R7401 Elevation of levels of liver transaminase levels: Secondary | ICD-10-CM

## 2020-06-27 DIAGNOSIS — M19019 Primary osteoarthritis, unspecified shoulder: Secondary | ICD-10-CM | POA: Diagnosis present

## 2020-06-27 DIAGNOSIS — B0089 Other herpesviral infection: Secondary | ICD-10-CM | POA: Diagnosis present

## 2020-06-27 DIAGNOSIS — D638 Anemia in other chronic diseases classified elsewhere: Secondary | ICD-10-CM | POA: Diagnosis present

## 2020-06-27 DIAGNOSIS — E039 Hypothyroidism, unspecified: Secondary | ICD-10-CM | POA: Diagnosis present

## 2020-06-27 DIAGNOSIS — Z79899 Other long term (current) drug therapy: Secondary | ICD-10-CM | POA: Diagnosis not present

## 2020-06-27 DIAGNOSIS — B9689 Other specified bacterial agents as the cause of diseases classified elsewhere: Secondary | ICD-10-CM | POA: Diagnosis not present

## 2020-06-27 DIAGNOSIS — M25511 Pain in right shoulder: Secondary | ICD-10-CM | POA: Diagnosis present

## 2020-06-27 DIAGNOSIS — G001 Pneumococcal meningitis: Secondary | ICD-10-CM | POA: Diagnosis present

## 2020-06-27 DIAGNOSIS — Z806 Family history of leukemia: Secondary | ICD-10-CM

## 2020-06-27 DIAGNOSIS — Z90710 Acquired absence of both cervix and uterus: Secondary | ICD-10-CM

## 2020-06-27 DIAGNOSIS — E119 Type 2 diabetes mellitus without complications: Secondary | ICD-10-CM | POA: Diagnosis present

## 2020-06-27 DIAGNOSIS — R7309 Other abnormal glucose: Secondary | ICD-10-CM | POA: Diagnosis not present

## 2020-06-27 DIAGNOSIS — R339 Retention of urine, unspecified: Secondary | ICD-10-CM

## 2020-06-27 DIAGNOSIS — F32A Depression, unspecified: Secondary | ICD-10-CM | POA: Diagnosis present

## 2020-06-27 DIAGNOSIS — E782 Mixed hyperlipidemia: Secondary | ICD-10-CM | POA: Diagnosis present

## 2020-06-27 DIAGNOSIS — J45909 Unspecified asthma, uncomplicated: Secondary | ICD-10-CM | POA: Diagnosis present

## 2020-06-27 DIAGNOSIS — G061 Intraspinal abscess and granuloma: Secondary | ICD-10-CM

## 2020-06-27 DIAGNOSIS — Z7951 Long term (current) use of inhaled steroids: Secondary | ICD-10-CM

## 2020-06-27 DIAGNOSIS — Z794 Long term (current) use of insulin: Secondary | ICD-10-CM | POA: Diagnosis not present

## 2020-06-27 DIAGNOSIS — Z856 Personal history of leukemia: Secondary | ICD-10-CM | POA: Diagnosis present

## 2020-06-27 DIAGNOSIS — K649 Unspecified hemorrhoids: Secondary | ICD-10-CM | POA: Diagnosis present

## 2020-06-27 DIAGNOSIS — G9340 Encephalopathy, unspecified: Secondary | ICD-10-CM | POA: Diagnosis present

## 2020-06-27 DIAGNOSIS — A6004 Herpesviral vulvovaginitis: Secondary | ICD-10-CM | POA: Diagnosis present

## 2020-06-27 DIAGNOSIS — R7881 Bacteremia: Secondary | ICD-10-CM | POA: Diagnosis not present

## 2020-06-27 DIAGNOSIS — G8929 Other chronic pain: Secondary | ICD-10-CM | POA: Diagnosis present

## 2020-06-27 DIAGNOSIS — M25561 Pain in right knee: Secondary | ICD-10-CM

## 2020-06-27 DIAGNOSIS — R000 Tachycardia, unspecified: Secondary | ICD-10-CM | POA: Diagnosis not present

## 2020-06-27 DIAGNOSIS — I10 Essential (primary) hypertension: Secondary | ICD-10-CM | POA: Diagnosis present

## 2020-06-27 DIAGNOSIS — Z8661 Personal history of infections of the central nervous system: Secondary | ICD-10-CM

## 2020-06-27 DIAGNOSIS — C9110 Chronic lymphocytic leukemia of B-cell type not having achieved remission: Secondary | ICD-10-CM | POA: Diagnosis present

## 2020-06-27 DIAGNOSIS — Z79890 Hormone replacement therapy (postmenopausal): Secondary | ICD-10-CM

## 2020-06-27 DIAGNOSIS — G039 Meningitis, unspecified: Secondary | ICD-10-CM

## 2020-06-27 DIAGNOSIS — K5901 Slow transit constipation: Secondary | ICD-10-CM | POA: Diagnosis present

## 2020-06-27 DIAGNOSIS — M5481 Occipital neuralgia: Secondary | ICD-10-CM | POA: Diagnosis present

## 2020-06-27 DIAGNOSIS — M5030 Other cervical disc degeneration, unspecified cervical region: Secondary | ICD-10-CM | POA: Diagnosis present

## 2020-06-27 DIAGNOSIS — R5381 Other malaise: Secondary | ICD-10-CM | POA: Diagnosis present

## 2020-06-27 DIAGNOSIS — R52 Pain, unspecified: Secondary | ICD-10-CM

## 2020-06-27 DIAGNOSIS — E876 Hypokalemia: Secondary | ICD-10-CM

## 2020-06-27 DIAGNOSIS — M5010 Cervical disc disorder with radiculopathy, unspecified cervical region: Secondary | ICD-10-CM | POA: Diagnosis present

## 2020-06-27 DIAGNOSIS — Z6831 Body mass index (BMI) 31.0-31.9, adult: Secondary | ICD-10-CM

## 2020-06-27 DIAGNOSIS — Z833 Family history of diabetes mellitus: Secondary | ICD-10-CM

## 2020-06-27 DIAGNOSIS — M19011 Primary osteoarthritis, right shoulder: Secondary | ICD-10-CM | POA: Diagnosis present

## 2020-06-27 DIAGNOSIS — M47812 Spondylosis without myelopathy or radiculopathy, cervical region: Secondary | ICD-10-CM | POA: Diagnosis present

## 2020-06-27 DIAGNOSIS — M47816 Spondylosis without myelopathy or radiculopathy, lumbar region: Secondary | ICD-10-CM | POA: Diagnosis present

## 2020-06-27 DIAGNOSIS — M51369 Other intervertebral disc degeneration, lumbar region without mention of lumbar back pain or lower extremity pain: Secondary | ICD-10-CM | POA: Diagnosis present

## 2020-06-27 DIAGNOSIS — R1012 Left upper quadrant pain: Secondary | ICD-10-CM

## 2020-06-27 DIAGNOSIS — E11649 Type 2 diabetes mellitus with hypoglycemia without coma: Secondary | ICD-10-CM | POA: Diagnosis not present

## 2020-06-27 DIAGNOSIS — M1990 Unspecified osteoarthritis, unspecified site: Secondary | ICD-10-CM | POA: Diagnosis present

## 2020-06-27 DIAGNOSIS — T1490XA Injury, unspecified, initial encounter: Secondary | ICD-10-CM

## 2020-06-27 DIAGNOSIS — A609 Anogenital herpesviral infection, unspecified: Secondary | ICD-10-CM

## 2020-06-27 DIAGNOSIS — G5603 Carpal tunnel syndrome, bilateral upper limbs: Secondary | ICD-10-CM | POA: Diagnosis present

## 2020-06-27 DIAGNOSIS — M533 Sacrococcygeal disorders, not elsewhere classified: Secondary | ICD-10-CM | POA: Diagnosis present

## 2020-06-27 DIAGNOSIS — Z7984 Long term (current) use of oral hypoglycemic drugs: Secondary | ICD-10-CM

## 2020-06-27 LAB — GLUCOSE, CAPILLARY: Glucose-Capillary: 205 mg/dL — ABNORMAL HIGH (ref 70–99)

## 2020-06-27 MED ORDER — INSULIN GLARGINE 100 UNIT/ML ~~LOC~~ SOLN: 45.0000 [IU] | Freq: Every day | SUBCUTANEOUS | 11 refills | Status: DC

## 2020-06-27 MED ORDER — MELATONIN 5 MG PO TABS: 5.0000 mg | ORAL_TABLET | Freq: Every day | ORAL | 0 refills | Status: DC

## 2020-06-27 MED ORDER — MAGNESIUM OXIDE 400 (241.3 MG) MG PO TABS
400.0000 mg | ORAL_TABLET | Freq: Two times a day (BID) | ORAL | Status: DC
  Administered 2020-06-28 – 2020-07-31 (×66): 400 mg via ORAL
  Filled 2020-06-27 (×67): qty 1

## 2020-06-27 MED ORDER — DILTIAZEM HCL ER COATED BEADS 120 MG PO CP24
120.0000 mg | ORAL_CAPSULE | Freq: Every day | ORAL | Status: DC
  Administered 2020-06-28 – 2020-07-31 (×34): 120 mg via ORAL
  Filled 2020-06-27 (×35): qty 1

## 2020-06-27 MED ORDER — IRBESARTAN 75 MG PO TABS
75.0000 mg | ORAL_TABLET | Freq: Every day | ORAL | Status: DC
  Administered 2020-06-28 – 2020-07-31 (×34): 75 mg via ORAL
  Filled 2020-06-27 (×36): qty 1

## 2020-06-27 MED ORDER — SODIUM CHLORIDE 0.9 % IV SOLN
2.0000 g | Freq: Two times a day (BID) | INTRAVENOUS | Status: AC
  Administered 2020-06-28 – 2020-07-23 (×50): 2 g via INTRAVENOUS
  Filled 2020-06-27: qty 20
  Filled 2020-06-27 (×11): qty 2
  Filled 2020-06-27: qty 20
  Filled 2020-06-27: qty 2
  Filled 2020-06-27 (×3): qty 20
  Filled 2020-06-27 (×4): qty 2
  Filled 2020-06-27 (×2): qty 20
  Filled 2020-06-27 (×5): qty 2
  Filled 2020-06-27 (×2): qty 20
  Filled 2020-06-27 (×11): qty 2
  Filled 2020-06-27 (×2): qty 20
  Filled 2020-06-27 (×2): qty 2
  Filled 2020-06-27: qty 20
  Filled 2020-06-27 (×4): qty 2
  Filled 2020-06-27 (×3): qty 20
  Filled 2020-06-27 (×2): qty 2

## 2020-06-27 MED ORDER — THIAMINE HCL 100 MG PO TABS: 100.0000 mg | ORAL_TABLET | Freq: Every day | ORAL | Status: DC

## 2020-06-27 MED ORDER — OXYCODONE HCL 5 MG PO TABS: 5.0000 mg | ORAL_TABLET | ORAL | 0 refills | Status: DC | PRN

## 2020-06-27 MED ORDER — VALACYCLOVIR HCL 500 MG PO TABS
1000.0000 mg | ORAL_TABLET | Freq: Two times a day (BID) | ORAL | Status: DC
  Administered 2020-06-28 – 2020-07-31 (×67): 1000 mg via ORAL
  Filled 2020-06-27 (×68): qty 2

## 2020-06-27 MED ORDER — POLYETHYLENE GLYCOL 3350 17 G PO PACK
17.0000 g | PACK | Freq: Every day | ORAL | Status: DC | PRN
  Administered 2020-06-27 – 2020-06-30 (×2): 17 g via ORAL
  Filled 2020-06-27 (×2): qty 1

## 2020-06-27 MED ORDER — IRBESARTAN 150 MG PO TABS: 75.0000 mg | ORAL_TABLET | Freq: Every day | ORAL | Status: DC

## 2020-06-27 MED ORDER — PANTOPRAZOLE SODIUM 40 MG PO TBEC: 40.0000 mg | DELAYED_RELEASE_TABLET | Freq: Every day | ORAL | Status: DC

## 2020-06-27 MED ORDER — ENOXAPARIN SODIUM 60 MG/0.6ML ~~LOC~~ SOLN: 52.5000 mg | SUBCUTANEOUS | Status: DC

## 2020-06-27 MED ORDER — ENOXAPARIN SODIUM 60 MG/0.6ML ~~LOC~~ SOLN
50.0000 mg | SUBCUTANEOUS | Status: DC
  Administered 2020-06-28 – 2020-07-31 (×34): 50 mg via SUBCUTANEOUS
  Filled 2020-06-27 (×35): qty 0.6

## 2020-06-27 MED ORDER — DILTIAZEM HCL ER COATED BEADS 120 MG PO CP24: 120.0000 mg | ORAL_CAPSULE | Freq: Every day | ORAL | Status: DC

## 2020-06-27 MED ORDER — OXYCODONE HCL 5 MG PO TABS
5.0000 mg | ORAL_TABLET | ORAL | Status: DC | PRN
  Administered 2020-06-28 – 2020-07-05 (×7): 5 mg via ORAL
  Filled 2020-06-27 (×9): qty 1

## 2020-06-27 MED ORDER — INSULIN GLARGINE 100 UNIT/ML ~~LOC~~ SOLN
45.0000 [IU] | Freq: Every day | SUBCUTANEOUS | Status: DC
  Administered 2020-06-28 – 2020-07-09 (×12): 45 [IU] via SUBCUTANEOUS
  Filled 2020-06-27 (×12): qty 0.45

## 2020-06-27 MED ORDER — LORAZEPAM 2 MG/ML IJ SOLN: 1.0000 mg | Freq: Once | INTRAMUSCULAR | Status: DC

## 2020-06-27 MED ORDER — INSULIN ASPART 100 UNIT/ML ~~LOC~~ SOLN
0.0000 [IU] | Freq: Three times a day (TID) | SUBCUTANEOUS | Status: DC
  Administered 2020-06-28: 11 [IU] via SUBCUTANEOUS
  Administered 2020-06-28: 8 [IU] via SUBCUTANEOUS
  Administered 2020-06-29: 3 [IU] via SUBCUTANEOUS
  Administered 2020-06-29: 5 [IU] via SUBCUTANEOUS
  Administered 2020-06-30: 11 [IU] via SUBCUTANEOUS
  Administered 2020-06-30: 2 [IU] via SUBCUTANEOUS
  Administered 2020-07-01: 3 [IU] via SUBCUTANEOUS
  Administered 2020-07-01 – 2020-07-02 (×3): 5 [IU] via SUBCUTANEOUS
  Administered 2020-07-03: 8 [IU] via SUBCUTANEOUS
  Administered 2020-07-03 – 2020-07-07 (×5): 3 [IU] via SUBCUTANEOUS
  Administered 2020-07-07 (×2): 2 [IU] via SUBCUTANEOUS
  Administered 2020-07-08: 5 [IU] via SUBCUTANEOUS
  Administered 2020-07-08 – 2020-07-17 (×8): 2 [IU] via SUBCUTANEOUS
  Administered 2020-07-17: 3 [IU] via SUBCUTANEOUS
  Administered 2020-07-18 – 2020-07-19 (×3): 2 [IU] via SUBCUTANEOUS
  Administered 2020-07-20 – 2020-07-21 (×2): 3 [IU] via SUBCUTANEOUS
  Administered 2020-07-21 – 2020-07-22 (×2): 2 [IU] via SUBCUTANEOUS
  Administered 2020-07-22: 3 [IU] via SUBCUTANEOUS
  Administered 2020-07-23 (×2): 2 [IU] via SUBCUTANEOUS
  Administered 2020-07-23: 3 [IU] via SUBCUTANEOUS
  Administered 2020-07-24 – 2020-07-25 (×3): 2 [IU] via SUBCUTANEOUS
  Administered 2020-07-26 – 2020-07-27 (×3): 3 [IU] via SUBCUTANEOUS
  Administered 2020-07-28: 2 [IU] via SUBCUTANEOUS
  Administered 2020-07-28: 3 [IU] via SUBCUTANEOUS
  Administered 2020-07-29 (×2): 2 [IU] via SUBCUTANEOUS
  Administered 2020-07-30 (×2): 3 [IU] via SUBCUTANEOUS

## 2020-06-27 MED ORDER — INSULIN ASPART 100 UNIT/ML ~~LOC~~ SOLN: 5.0000 [IU] | Freq: Three times a day (TID) | SUBCUTANEOUS | 11 refills | Status: DC

## 2020-06-27 MED ORDER — ENOXAPARIN SODIUM 60 MG/0.6ML ~~LOC~~ SOLN: 0.5000 mg/kg | SUBCUTANEOUS | Status: DC

## 2020-06-27 MED ORDER — LEVOTHYROXINE SODIUM 75 MCG PO TABS
150.0000 ug | ORAL_TABLET | Freq: Every day | ORAL | Status: DC
  Administered 2020-06-29 – 2020-07-31 (×33): 150 ug via ORAL
  Filled 2020-06-27 (×34): qty 2

## 2020-06-27 MED ORDER — MAGNESIUM OXIDE 400 (241.3 MG) MG PO TABS: 400.0000 mg | ORAL_TABLET | Freq: Two times a day (BID) | ORAL | Status: DC

## 2020-06-27 MED ORDER — INSULIN GLARGINE 100 UNIT/ML ~~LOC~~ SOLN
45.0000 [IU] | Freq: Every day | SUBCUTANEOUS | Status: DC
  Filled 2020-06-27: qty 0.45

## 2020-06-27 MED ORDER — PANTOPRAZOLE SODIUM 40 MG PO TBEC
40.0000 mg | DELAYED_RELEASE_TABLET | Freq: Every day | ORAL | Status: DC
  Administered 2020-06-28 – 2020-07-31 (×34): 40 mg via ORAL
  Filled 2020-06-27 (×34): qty 1

## 2020-06-27 MED ORDER — LORAZEPAM 2 MG/ML IJ SOLN
INTRAMUSCULAR | Status: AC
  Filled 2020-06-27: qty 1

## 2020-06-27 MED ORDER — MELATONIN 5 MG PO TABS
5.0000 mg | ORAL_TABLET | Freq: Every day | ORAL | Status: DC
  Administered 2020-06-28 – 2020-07-30 (×33): 5 mg via ORAL
  Filled 2020-06-27 (×33): qty 1

## 2020-06-27 MED ORDER — IRBESARTAN 75 MG PO TABS: 75.0000 mg | ORAL_TABLET | Freq: Every day | ORAL | Status: DC

## 2020-06-27 MED ORDER — LORAZEPAM 2 MG/ML IJ SOLN: 1.0000 mg | Freq: Once | INTRAMUSCULAR | Status: DC | PRN

## 2020-06-27 MED ORDER — SODIUM CHLORIDE 0.9 % IV SOLN: 2.0000 g | Freq: Two times a day (BID) | INTRAVENOUS | Status: DC

## 2020-06-27 MED ORDER — THIAMINE HCL 100 MG PO TABS
100.0000 mg | ORAL_TABLET | Freq: Every day | ORAL | Status: DC
  Administered 2020-06-28 – 2020-07-31 (×34): 100 mg via ORAL
  Filled 2020-06-27 (×36): qty 1

## 2020-06-27 MED ORDER — ACETAMINOPHEN 325 MG PO TABS
325.0000 mg | ORAL_TABLET | Freq: Four times a day (QID) | ORAL | Status: DC | PRN
  Administered 2020-06-28 – 2020-06-29 (×3): 325 mg via ORAL
  Filled 2020-06-27 (×3): qty 1

## 2020-06-27 MED ORDER — INSULIN ASPART 100 UNIT/ML ~~LOC~~ SOLN
5.0000 [IU] | Freq: Three times a day (TID) | SUBCUTANEOUS | Status: DC
  Administered 2020-06-28 – 2020-07-10 (×27): 5 [IU] via SUBCUTANEOUS

## 2020-06-27 NOTE — H&P (Signed)
Physical Medicine and Rehabilitation Admission H&P  CC: Epidural spinal abscess  HPI: Tonya Myers is a 55 year old right-handed female with history of diabetes mellitus, CLL, chronic back pain, hypertension.  Per chart review patient lives with spouse.  Two-level home bed and bath main level.  Independent prior to admission.  Presented 06/09/2020 with altered mental status and aphasia.  Patient had evidently presented earlier that day and left without being seen after having labs done.  She returned to the ED was unable to fully express why she was there with altered mental status noted.  Complaints of neck pain.  Cranial CT scan negative.  MRI normal brain.  EEG negative for seizure.  Chest x-ray no active disease.  Echocardiogram with ejection fraction of 50 to 55% no wall motion abnormalities.  Admission chemistries unremarkable aside glucose 274 creatinine 1.35, WBC 15,500 hemoglobin 10.1, troponin negative, hemoglobin A1c 8.1.  TSH 5.742, alkaline phosphatase 200 total bilirubin 2.1 direct bilirubin 1.2 SARS coronavirus negative, ammonia negative.  Patient subsequently spiked a fever as well as maintain on insulin therapy suspect DKA presentation.  She was placed on broad-spectrum antibiotics for fever and concern of meningitis   She underwent LP.  CSF examination showed gram-positive diplococci.  MRI cervical lumbar thoracic spine showed findings compatible lumbar spinal infection.  There appeared to be septic arthritis in the left L2-3 and 3-4 facets with adjacent paraspinous abscesses.  Ventral epidural abscess at L3-4 small but appears mildly progressive.  She was placed on broad-spectrum antibiotics for suspected meningitis.  Patient underwent T3-4 laminectomy, T5-7 laminectomy, 9-10 laminectomy evacuation of abscess 06/17/2020 per Dr. Lysle Morales with repeat lumbar paraspinal abscess aspiration by interventional radiology 06/19/2020.  Recommendations to continue ceftriaxone total of 6 weeks  through 07/23/2020.Marland Kitchen  Patient was cleared to begin Lovenox for DVT prophylaxis.  Close monitoring of liver functions as they remained elevated but stable.  Due to patient's overall decrease in functional ability and need for physical assistance she was admitted for a comprehensive rehab program.  Review of Systems  Constitutional: Positive for fever and malaise/fatigue. Negative for chills.  HENT: Negative for hearing loss.   Eyes: Negative for blurred vision and double vision.  Respiratory: Negative for cough and shortness of breath.   Cardiovascular: Positive for leg swelling. Negative for chest pain and palpitations.  Gastrointestinal: Positive for constipation. Negative for heartburn, nausea and vomiting.  Genitourinary: Negative for dysuria, flank pain and hematuria.  Musculoskeletal: Positive for back pain, joint pain and myalgias.  Skin: Negative for rash.  Neurological: Positive for weakness and headaches.  Psychiatric/Behavioral: Positive for depression. The patient has insomnia.        Anxiety  All other systems reviewed and are negative.  Past Medical History:  Diagnosis Date  . Acute hemorrhoid 03/11/2015  . Anxiety   . Arthritis   . Asthma   . Cancer ([place])   . Chronic back pain   . CLL (chronic lymphocytic leukemia) ([place])   . Depression   . Diabetes mellitus without complication ([place])   . Genital herpes    type 2  . Hypertension   . Hypothyroidism   . Vertigo    Past Surgical History:  Procedure Laterality Date  . ABDOMINAL HYSTERECTOMY    . BILATERAL SALPINGOOPHORECTOMY  2009  . BREAST BIOPSY Right 05/17/2016   FIBROADENOMATOUS CHANGE AND SCLEROSING ADENOSIS WITH  . COLONOSCOPY WITH PROPOFOL N/A 06/16/2015   Procedure: COLONOSCOPY WITH PROPOFOL;  Surgeon: [name], MD;  Location: [facility] ENDOSCOPY;  Service: Endoscopy;  Laterality: N/A;  . LAPAROSCOPIC SUPRACERVICAL HYSTERECTOMY  2009   due to [place]  . OOPHORECTOMY    . THORACIC LAMINECTOMY FOR EPIDURAL  ABSCESS Bilateral 06/17/2020   Procedure: THORACIC LAMINECTOMY FOR EPIDURAL ABSCESS;  Surgeon: Deetta Perla, MD;  Location: [facility] ORS;  Service: Neurosurgery;  Laterality: Bilateral;   Family History  Problem Relation Age of Onset  . Cancer Paternal Aunt   . Breast cancer Maternal Aunt 70   Social History:  reports that she has never smoked. She has never used smokeless tobacco. She reports current alcohol use of about 1.0 standard drink of alcohol per week. She reports that she does not use drugs. Allergies: No Known Allergies Medications Prior to Admission  Medication Sig Dispense Refill  . cefTRIAXone 2 g in sodium chloride 0.9 % 100 mL Inject 2 g into the vein every 12 (twelve) hours for 26 days.    . cyclobenzaprine (FLEXERIL) 10 MG tablet Take 10 mg by mouth 3 (three) times daily.    Marland Kitchen diltiazem (DILACOR XR) 120 MG 24 hr capsule Take 120 mg by mouth daily.     Derrill Memo ON 06/28/2020] enoxaparin (LOVENOX) 60 MG/0.6ML injection Inject 0.525 mLs (52.5 mg total) into the skin daily. 0 mL   . ezetimibe (ZETIA) 10 MG tablet Take 10 mg by mouth daily.     . furosemide (LASIX) 20 MG tablet Take 20 mg by mouth daily.    . insulin aspart (NOVOLOG) 100 UNIT/ML injection Inject 5 Units into the skin 3 (three) times daily with meals. 10 mL 11  . insulin glargine (LANTUS) 100 UNIT/ML injection Inject 0.45 mLs (45 Units total) into the skin daily. 10 mL 11  . levothyroxine (SYNTHROID, LEVOTHROID) 150 MCG tablet Take 150 mcg by mouth daily before breakfast.     . magnesium oxide (MAG-OX) 400 (241.3 Mg) MG tablet Take 1 tablet (400 mg total) by mouth 2 (two) times daily.    . melatonin 5 MG TABS Take 1 tablet (5 mg total) by mouth at bedtime.  0  . metFORMIN (GLUCOPHAGE-XR) 500 MG 24 hr tablet Take 500 mg by mouth 2 (two) times daily.    . montelukast (SINGULAIR) 10 MG tablet Take 10 mg by mouth at bedtime.    . naproxen (NAPROSYN) 500 MG tablet Take 500 mg by mouth 2 (two) times daily.    Marland Kitchen oxyCODONE  (OXY IR/ROXICODONE) 5 MG immediate release tablet Take 1 tablet (5 mg total) by mouth every 4 (four) hours as needed for severe pain. 30 tablet 0  . [START ON 06/28/2020] pantoprazole (PROTONIX) 40 MG tablet Take 1 tablet (40 mg total) by mouth daily.    . pregabalin (LYRICA) 75 MG capsule Take 75 mg by mouth 2 (two) times daily.    . rosuvastatin (CRESTOR) 10 MG tablet Take 10 mg by mouth at bedtime.    Marland Kitchen telmisartan-hydrochlorothiazide (MICARDIS HCT) 80-25 MG tablet Take 1 tablet by mouth daily.    Derrill Memo ON 06/28/2020] thiamine 100 MG tablet Take 1 tablet (100 mg total) by mouth daily.    . valACYclovir (VALTREX) 1000 MG tablet Take 1 tablet (1,000 mg total) by mouth 2 (two) times daily. 60 tablet 1    Drug Regimen Review  Drug regimen was reviewed and remains appropriate with no significant issues identified  Home: Home Living Family/patient expects to be discharged to:: Private residence Living Arrangements: Spouse/significant other Available Help at Discharge: Family,Available 24 hours/day Type of Home: House Home Access: Level  entry Home Layout: Two level,Able to live on main level with bedroom/bathroom Alternate Level Stairs-Number of Steps: 16 Alternate Level Stairs-Rails:  (Pt unable to recall) Bathroom Shower/Tub: Multimedia programmer: Standard Home Equipment: Shower seat,Grab bars - toilet   Functional History: Prior Function Level of Independence: Independent Comments: Data on living situation and PLOF taken from chart review. Pt unable at this time to communicate this information.  Functional Status:  Mobility: Bed Mobility Overal bed mobility: Needs Assistance Bed Mobility: Supine to Sit,Sit to Supine Rolling: Min guard Supine to sit: Mod assist Sit to supine: Mod assist General bed mobility comments: Improved effort and performance in bed mobility Transfers Overall transfer level: Needs assistance Transfers: Lateral/Scoot Transfers  Lateral/Scoot  Transfers: Max assist,+2 physical assistance General transfer comment: did not attempt Ambulation/Gait General Gait Details: unsafe/unable  ADL: ADL Overall ADL's : Needs assistance/impaired Grooming: Wash/dry face,Set up,Bed level Grooming Details (indicate cue type and reason): Following set-up assistance, pt able to bring washcloth to face with increased time and effort Upper Body Dressing : Minimal assistance,Sitting Upper Body Dressing Details (indicate cue type and reason): Pt able to assist with threading arms through sleeves Lower Body Dressing: Maximal assistance,Bed level Lower Body Dressing Details (indicate cue type and reason): donning socks Toileting- Clothing Manipulation and Hygiene: Maximal assistance,Bed level,+2 for physical assistance Functional mobility during ADLs: Maximal assistance  Cognition: Cognition Overall Cognitive Status: Impaired/Different from baseline Orientation Level: Oriented to person,Oriented to place,Disoriented to situation,Oriented to time Cognition Arousal/Alertness: Awake/alert Behavior During Therapy: Flat affect Overall Cognitive Status: Impaired/Different from baseline General Comments: difficulty maintaining focus and attention; some word finding/expressive difficulties   Physical Exam: There were no vitals taken for this visit. Physical Exam Gen: no distress, normal appearing HEENT: oral mucosa pink and moist, NCAT Cardio: Reg rate Chest: normal effort, normal rate of breathing Abd: soft, non-distended Ext: no edema Psych: pleasant, normal affect Skin:     Comments: Back incision dressing intact. IV in place in right arm Neurological:     Comments: Patient is alert and oriented x3.  No acute distress.  Mood is a bit flat but appropriate.  Provides name and age.  Follows simple commands. 4-/5 strength throughout- limited by fatigue    Results for orders placed or performed during the hospital encounter of 06/09/20 (from  the past 48 hour(s))  Glucose, capillary     Status: Abnormal   Collection Time: 06/25/20  3:38 PM  Result Value Ref Range   Glucose-Capillary 220 (H) 70 - 99 mg/dL    Comment: Glucose reference range applies only to samples taken after fasting for at least 8 hours.  Glucose, capillary     Status: Abnormal   Collection Time: 06/25/20  9:29 PM  Result Value Ref Range   Glucose-Capillary 293 (H) 70 - 99 mg/dL    Comment: Glucose reference range applies only to samples taken after fasting for at least 8 hours.  Glucose, capillary     Status: Abnormal   Collection Time: 06/26/20  9:13 AM  Result Value Ref Range   Glucose-Capillary 180 (H) 70 - 99 mg/dL    Comment: Glucose reference range applies only to samples taken after fasting for at least 8 hours.  Glucose, capillary     Status: Abnormal   Collection Time: 06/26/20 11:40 AM  Result Value Ref Range   Glucose-Capillary 236 (H) 70 - 99 mg/dL    Comment: Glucose reference range applies only to samples taken after fasting for at least 8 hours.  Glucose, capillary     Status: Abnormal   Collection Time: 06/26/20  3:53 PM  Result Value Ref Range   Glucose-Capillary 150 (H) 70 - 99 mg/dL    Comment: Glucose reference range applies only to samples taken after fasting for at least 8 hours.  Glucose, capillary     Status: Abnormal   Collection Time: 06/27/20  7:50 AM  Result Value Ref Range   Glucose-Capillary 205 (H) 70 - 99 mg/dL    Comment: Glucose reference range applies only to samples taken after fasting for at least 8 hours.   No results found.     Medical Problem List and Plan: 1.  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  Status post T3-4, T5-7, T9-10 laminectomy with evacuation of abscess with repeat lumbar paraspinal abscess aspiration by interventional radiology 06/19/2020.  Orders to remove sutures 07/01/2020.  -patient may shower but  incision must be covered  -ELOS/Goals: MinA 3-4 weeks 2.  Antithrombotics: -DVT/anticoagulation: Continue Lovenox 52.5 mg daily.   -antiplatelet therapy: N/A 3. Pain Management: Oxycodone as needed 4. Mood: Melatonin 5 mg nightly  -antipsychotic agents: N/A 5. Neuropsych: This patient is capable of making decisions on her own behalf. 6. Skin/Wound Care: Routine skin checks 7. Fluids/Electrolytes/Nutrition: Routine in and outs with follow-up chemistries 8.  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  Continue Rocephin through 07/23/2020 and follow-up infectious disease. 9.  Diabetes mellitus/DKA.  Hemoglobin A1c 8.0.  NovoLog 5 units 3 times daily, Lantus insulin 45 units daily.  Check blood sugars before meals and at bedtime 10.  CLL.  Follow-up outpatient Dr. Delight Hoh 11.  Hypothyroidism.  Synthroid 12.  Anemia of chronic disease.  Multifactorial.  Follow-up CBC 13.  Hypertension.  Dilacor120 mg daily,Micardis 80-25 mg daily  3/4: BP well controlled 14.  Transaminitis.  Follow-up chemistries 15.  Obesity.  BMI 31.19.  Dietary follow-up 16.  History of asthma.  Continue Singulair 10 mg nightly as well as Symbicort 160-4.5 mcg.  Resume as needed 17.  History of genital herpes.  Conitnue Valtrex  Lavon Paganini Angiulli, PA-C  I have personally performed a face to face diagnostic evaluation, including, but not limited to relevant history and physical exam findings, of this patient and developed relevant assessment and plan.  Additionally, I have reviewed and concur with the physician assistant's documentation above.  Izora Ribas, MD 06/27/2020

## 2020-06-27 NOTE — Progress Notes (Signed)
At bedside to start new IV per consult.  Baxter Kail, RN measuring PICC that was removed by patient.  Verified that PICC is intact 40cm.  Baxter Kail states that dressing has been applied to site.  Once patient was alerted that new IV needed to be started patient states "I have the right to service and I refuse to have IV started".  Thanked patient and left.  Baxter Kail, RN to follow up with MD.

## 2020-06-27 NOTE — Progress Notes (Signed)
   06/26/20 2156  Assess: MEWS Score  Temp 99.9 F (37.7 C)  BP (!) 154/85  Pulse Rate (!) 119  Resp 20  SpO2 97 %  Assess: MEWS Score  MEWS Temp 0  MEWS Systolic 0  MEWS Pulse 2  MEWS RR 0  MEWS LOC 0  MEWS Score 2  MEWS Score Color Yellow  Assess: if the MEWS score is Yellow or Red  Were vital signs taken at a resting state? Yes  Focused Assessment Change from prior assessment (see assessment flowsheet)  Early Detection of Sepsis Score *See Row Information* Low  MEWS guidelines implemented *See Row Information* Yes  Treat  MEWS Interventions Administered scheduled meds/treatments  Pain Scale 0-10  Pain Score 0  Faces Pain Scale 0  Take Vital Signs  Increase Vital Sign Frequency  Yellow: Q 2hr X 2 then Q 4hr X 2, if remains yellow, continue Q 4hrs  Escalate  MEWS: Escalate Yellow: discuss with charge nurse/RN and consider discussing with provider and RRT  Notify: Charge Nurse/RN  Name of Charge Nurse/RN Notified Phyllis  Date Charge Nurse/RN Notified 06/26/20  Time Charge Nurse/RN Notified 2210  Notify: Provider  Provider Name/Title Ouma NP  Date Provider Notified 06/27/20  Time Provider Notified [phone]  Notification Type Page  Notification Reason Other (Comment) (HR)  Document  Patient Outcome Stabilized after interventions

## 2020-06-27 NOTE — Progress Notes (Signed)
Staff walked into patients room and found that she had pulled her PICC line out. RN was called, pressure dressing was applied. Patient stated she didn't need the PICC line anymore because she wasn't going to take any more medicine. Patient seemed agitated with staff and stated "why are you making me take drugs I already took". IV consult was ordered. Will attempt to place new IV access for scheduled antibiotics.

## 2020-06-27 NOTE — Progress Notes (Signed)
Inpatient Rehabilitation Medication Review by a Pharmacist  A complete drug regimen review was completed for this patient to identify any potential clinically significant medication issues.  Clinically significant medication issues were identified:  yes   Type of Medication Issue Identified Description of Issue Urgent (address now) Non-Urgent (address on AM team rounds) Plan Plan Accepted by Provider? (Yes / No / Pending AM Rounds)  Drug Interaction(s) (clinically significant)       Duplicate Therapy       Allergy       No Medication Administration End Date       Incorrect Dose       Additional Drug Therapy Needed  Meds not ordered; Singulair, Symbicort, HCTZ, Crestor, Lyrica, Flexeril, Zetia, Lasix, Metformin     Other         Provider Method of Notification: Secure chat  For non-urgent medication issues to be resolved on team rounds tomorrow morning a [facility] Secure Chat Handoff was sent to: Ankit Lorie Phenix, MD and Lauraine Rinne, PA-C   Pharmacist comments: These are meds that were to be restarted after inpatient discharge per the discharge note and rehab admit note.  Time spent performing this drug regimen review (minutes):  [place], PharmD 06/27/2020 3:56 PM  Please check [email or web address] for unit-specific pharmacy phone numbers.

## 2020-06-27 NOTE — Progress Notes (Signed)
Staff attempted to administer Ativan 1 mg. Patient refused medicine and stated she wouldn't take anything until her husband, Tonya Myers, came to see her. Patient requested to be left alone in room. Staff will continue to monitor patient and report to MD if necessary.

## 2020-06-27 NOTE — Progress Notes (Signed)
Occupational Therapy Treatment Patient Details Name: Meghen Akopyan MRN: [id number] DOB: Oct 18, 1965 Today's Date: 06/27/2020    History of present illness Pt is a 55 y/o female admitted from home on 06/09/20 after presenting with AMS. Pt being treated for acute encephalopathy. 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 Pt also found to have paraspinal abscess, small epidural abscess, septic arthritis L2-L3 and L3-L4 with Neurosurgery & IR recommended serial imaging. Now s/p T3-4, T5-7, T9-10 laminectomy and evacuation of abscess (06/17/20); s/p IR drainage of paraspinal abscess later this date (06/19/20).  PMH: hypothyroidism, DDD, cervical radiculopathy, CLL, DM, HTN, HLD, anxiety, arthritis, CA, chronic back pain, asthma, depression, vertigo.   OT comments  Ms. Northington presents today with generalized weakness and reduced endurance, limiting her ability to engage in functional mobility tasks. She reports 9/10 pain in back, neck, and legs. She displayed slight confusion and expressive challenges today, making statements such as "when I press here on my leg, I can see the air come out. Can you see it, too?" She was able to participate today in donning a sock on her R LE, and showed improvement in her ability to transition supine<>sit. Her sitting tolerance was also improved; she was able to sit EOB for > 5 minutes and to raise her head into upright position while experiencing only mild dizziness. Therapist encouraged pt to attempt sit<>stand today, but pt declined, stating "I'm not ready for that." Partner present and supportive throughout session, requesting additional education into how he can best assist in Ms. Heagle's recovery. Transfer to [name] remains appropriate DC plan for this pt.    Follow Up Recommendations  [name]    Equipment Recommendations       Recommendations for Other Services      Precautions / Restrictions Precautions Precautions: Fall;Back Restrictions Weight  Bearing Restrictions: No       Mobility Bed Mobility Overal bed mobility: Needs Assistance Bed Mobility: Supine to Sit;Sit to Supine Rolling: Min guard   Supine to sit: Mod assist Sit to supine: Mod assist   General bed mobility comments: Improved effort and performance in bed mobility    Transfers                 General transfer comment: Pt declined standing    Balance Overall balance assessment: Needs assistance Sitting-balance support: Single extremity supported;Feet supported Sitting balance-Leahy Scale: Fair Sitting balance - Comments: improved sitting balance and increased tolerance today       Standing balance comment: unsafe/unable                           ADL either performed or assessed with clinical judgement   ADL Overall ADL's : Needs assistance/impaired Eating/Feeding: Supervision/ safety       Upper Body Bathing: Min guard;Set up Upper Body Bathing Details (indicate cue type and reason): VCs required for initiation and continuation         Lower Body Dressing: Maximal assistance;Moderate assistance Lower Body Dressing Details (indicate cue type and reason): donning socks, Mod A for RLE, Max A for LLE             Functional mobility during ADLs: Maximal assistance       Vision Baseline Vision/History: Wears glasses Wears Glasses: At all times Additional Comments: pt's glasses not available here at hospital   Perception     Praxis      Cognition Arousal/Alertness: Awake/alert Behavior During Therapy: Flat affect Overall  Cognitive Status: Impaired/Different from baseline                                 General Comments: word-finding/expressive difficulties today        Exercises Other Exercises Other Exercises: Rolling bilat, Min A +1, supine<>sit Mod A, fewer cues and physical assist required than during earlier sessions Other Exercises: Family education   Shoulder Instructions        General Comments      Pertinent Vitals/ Pain       Pain Assessment: 0-10 Pain Score: 9  Pain Location: back, neck, legs Pain Descriptors / Indicators: Grimacing Pain Intervention(s): Limited activity within patient's tolerance;Monitored during session;Repositioned  Home Living                                          Prior Functioning/Environment              Frequency  Min 3X/week        Progress Toward Goals  OT Goals(current goals can now be found in the care plan section)  Progress towards OT goals: Progressing toward goals  Acute Rehab OT Goals Patient Stated Goal: to get better OT Goal Formulation: With patient Time For Goal Achievement: 07/11/20 Potential to Achieve Goals: Good  Plan Discharge plan remains appropriate;Frequency remains appropriate    Co-evaluation                 AM-PAC OT "6 Clicks" Daily Activity     Outcome Measure   Help from another person eating meals?: A Little Help from another person taking care of personal grooming?: A Lot Help from another person toileting, which includes using toliet, bedpan, or urinal?: A Lot Help from another person bathing (including washing, rinsing, drying)?: A Lot Help from another person to put on and taking off regular upper body clothing?: A Lot Help from another person to put on and taking off regular lower body clothing?: A Lot 6 Click Score: 13    End of Session    OT Visit Diagnosis: Unsteadiness on feet (R26.81);Muscle weakness (generalized) (M62.81)   Activity Tolerance Patient tolerated treatment well   Patient Left in bed;with call bell/phone within reach;with bed alarm set;with family/visitor present   Nurse Communication          Time: 3267-1245 OT Time Calculation (min): 23 min  Charges: OT General Charges $OT Visit: 1 Visit OT Treatments $Self Care/Home Management : 23-37 mins  Josiah Lobo, PhD, [place], OTR/L ascom [phone] 06/27/20,  11:06 AM

## 2020-06-27 NOTE — Progress Notes (Signed)
Patient refused to have new IV line placed.

## 2020-06-27 NOTE — Discharge Summary (Signed)
Physician Discharge Summary  Sheyanne Munley NID:[id number] DOB: [birth year] DOA: 06/09/2020  PCP: [name], MD  Admit date: 06/09/2020 Discharge date: 06/27/2020  Admitted From: Home Disposition: Acute inpatient rehab  Recommendations for Outpatient Follow-up:  1. Follow up with PCP in 1-2 weeks after discharge from the hospital 2. Please obtain BMP/CBC/magnesium/phosphorus in one week 3. Follow-up with neurosurgery while in rehab for recommendations to remove stitches.  Home Health: Going to rehab Equipment/Devices: Going to rehab  Discharge Condition: Fair CODE STATUS: Full code Diet recommendation: Low-salt, low-carb diet  Discharge summary:  55 year old lady with history of type 2 diabetes on insulin, CLL, chronic back pain presented to hospital on 2/14 with altered mental status and unable to to talk.  MRI brain was negative for acute stroke.  She was found to be in DKA and also suffering from acute kidney injury. Hospital course complicated as she subsequently became febrile.  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  She was also found to have paraspinal abscesses that were treated with surgical incision.  Patient had a complicated hospitalization.  Going to rehab today.   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 Neurosurgery to OR for decompression on 06/17/20 where patient underwent T3/4 laminectomy, T5/7 laminectomy, T9/10 laminectomy and evacuation of abscess.  Repeat lumbar paraspinal abscess aspiration by IR on 2/24. Followed by infectious disease, currently on ceftriaxone with total plan for 6 weeks until 3/30. Followed by neurosurgery for postoperative plans.  Need suture removal as per surgical plan. Mobility.  Weightbearing as tolerated.  Continue aggressive rehab and referred to [name].  Has a PICC line on right arm for antibiotic.  Remove  when she finishes antibiotics.  Acute infective metabolic encephalopathy: As above.  Neurologically improving.  Normalizing.  Minimum opiate use for pain relief and mobility.  Laxatives.  Acute kidney injury: On presentation.  Improved and resolved.  Normalized.  Anemia of chronic disease: Multifactorial.  Stable.  No indication for transfusion.  Recheck in 1 week.  Uncontrolled type 2 diabetes with DKA on presentation: Hemoglobin A1c 8.  She had hypoglycemic episodes with poor appetite.  Now her blood sugars are more than 200 consistently.  Insulin doses adjusted today, keep on long-acting insulin, sliding scale insulin as well and titrate as her appetite improves.  Hypokalemia/hypomagnesemia: Improving.  We will keep on replacement.  Hypertension: Blood pressures are fairly stable.  Resume Micardis.  Patient is medically stable to transfer to acute inpatient rehab level of care.     Discharge Instructions  Discharge Instructions    Diet - low sodium heart healthy   Complete by: As directed    Diet Carb Modified   Complete by: As directed    Increase activity slowly   Complete by: As directed    No wound care   Complete by: As directed      Allergies as of 06/27/2020   No Known Allergies     Medication List    STOP taking these medications   estradiol 0.5 MG tablet Commonly known as: ESTRACE   Insulin Glargine-Lixisenatide 100-33 UNT-MCG/ML Sopn   insulin lispro 200 UNIT/ML KwikPen Commonly known as: HUMALOG   levocetirizine 5 MG tablet Commonly known as: XYZAL     TAKE these medications   cefTRIAXone 2 g in sodium chloride 0.9 % 100 mL Inject 2 g into the vein every 12 (twelve) hours for 26 days.   cyclobenzaprine 10 MG tablet Commonly known  as: FLEXERIL Take 10 mg by mouth 3 (three) times daily.   diltiazem 120 MG 24 hr capsule Commonly known as: DILACOR XR Take 120 mg by mouth daily.   enoxaparin 60 MG/0.6ML injection Commonly known as:  LOVENOX Inject 0.525 mLs (52.5 mg total) into the skin daily. Start taking on: June 28, 2020   ezetimibe 10 MG tablet Commonly known as: ZETIA Take 10 mg by mouth daily.   furosemide 20 MG tablet Commonly known as: LASIX Take 20 mg by mouth daily.   insulin aspart 100 UNIT/ML injection Commonly known as: novoLOG Inject 5 Units into the skin 3 (three) times daily with meals.   insulin glargine 100 UNIT/ML injection Commonly known as: LANTUS Inject 0.45 mLs (45 Units total) into the skin daily.   levothyroxine 150 MCG tablet Commonly known as: SYNTHROID Take 150 mcg by mouth daily before breakfast.   magnesium oxide 400 (241.3 Mg) MG tablet Commonly known as: MAG-OX Take 1 tablet (400 mg total) by mouth 2 (two) times daily.   melatonin 5 MG Tabs Take 1 tablet (5 mg total) by mouth at bedtime.   metFORMIN 500 MG 24 hr tablet Commonly known as: GLUCOPHAGE-XR Take 500 mg by mouth 2 (two) times daily.   montelukast 10 MG tablet Commonly known as: SINGULAIR Take 10 mg by mouth at bedtime.   naproxen 500 MG tablet Commonly known as: NAPROSYN Take 500 mg by mouth 2 (two) times daily.   oxyCODONE 5 MG immediate release tablet Commonly known as: Oxy IR/ROXICODONE Take 1 tablet (5 mg total) by mouth every 4 (four) hours as needed for severe pain.   pantoprazole 40 MG tablet Commonly known as: PROTONIX Take 1 tablet (40 mg total) by mouth daily. Start taking on: June 28, 2020   pregabalin 75 MG capsule Commonly known as: LYRICA Take 75 mg by mouth 2 (two) times daily.   rosuvastatin 10 MG tablet Commonly known as: CRESTOR Take 10 mg by mouth at bedtime.   telmisartan-hydrochlorothiazide 80-25 MG tablet Commonly known as: MICARDIS HCT Take 1 tablet by mouth daily.   thiamine 100 MG tablet Take 1 tablet (100 mg total) by mouth daily. Start taking on: June 28, 2020   valACYclovir 1000 MG tablet Commonly known as: Valtrex Take 1 tablet (1,000 mg total) by mouth 2  (two) times daily.       No Known Allergies  Consultations:  Infectious disease  Neurosurgery     Procedures/Studies: EEG  Result Date: 06/12/2020 Lora Havens, MD     06/12/2020  8:56 AM Patient Name: Deondrea Aguado MRN: [id number] Epilepsy Attending: Lora Havens Referring Physician/Provider: Dr. Neva Seat Date: 06/11/2020 Duration: 29.31 mins Patient history: 55 year old female with encephalopathy and pneumococcal meningitis.  EEG to eval for seizures. Level of alertness: Awake, asleep AEDs during EEG study: None Technical aspects: This EEG study was done with scalp electrodes positioned according to the 10-20 International system of electrode placement. Electrical activity was acquired at a sampling rate of 500Hz  and reviewed with a high frequency filter of 70Hz  and a low frequency filter of 1Hz . EEG data were recorded continuously and digitally stored. Description: No clear posterior dominant rhythm was seen. Sleep was characterized by vertex waves, sleep spindles (12 to 14 Hz), maximal frontocentral region.  EEG showed continuous generalized 3 to 6 Hz theta-delta slowing. Hyperventilation and photic stimulation were not performed.   ABNORMALITY -Continuous slow, generalized IMPRESSION: This study is suggestive of moderate diffuse encephalopathy, nonspecific etiology. No seizures or  epileptiform discharges were seen throughout the recording. [place] Foreign Body  Result Date: 06/10/2020 CLINICAL DATA:  Metal working/exposure; clearance prior to MRI EXAM: ORBITS FOR FOREIGN BODY - 2 VIEW COMPARISON:  None. FINDINGS: There is no evidence of metallic foreign body within the orbits. No significant bone abnormality identified. IMPRESSION: No evidence of metallic foreign body within the orbits. Electronically Signed   By: Ulyses Jarred M.D.   On: 06/10/2020 00:26   DG Chest 2 View  Result Date: 06/09/2020 CLINICAL DATA:  Shortness of breath EXAM: CHEST - 2  VIEW COMPARISON:  05/23/2020 FINDINGS: Heart and mediastinal contours are within normal limits. No focal opacities or effusions. No acute bony abnormality. IMPRESSION: No active cardiopulmonary disease. Electronically Signed   By: Rolm Baptise M.D.   On: 06/09/2020 12:25   DG Thoracic Spine 2 View  Result Date: 06/18/2020 CLINICAL DATA:  Bilateral thoracic laminectomy for epidural abscess EXAM: THORACIC SPINE 2 VIEWS; DG C-ARM 1-60 MIN-NO REPORT COMPARISON:  MR 06/17/2020 FINDINGS: Intraoperative fluoroscopy was obtained during the process of a thoracic laminectomy for evacuation of an epidural abscess. Image #5 demonstrates a blunt surgical implement projects over the T4 vertebral level on the frontal radiograph. Surgical retractors seen posteriorly. Images labeled as #3,4 are more difficult to localize given the absence of localizing landmarks. Recommend correlation with operative report. IMPRESSION: Intraoperative fluoroscopy obtained during the process of a thoracic laminectomy for evacuation of an epidural abscess. Electronically Signed   By: Lovena Le M.D.   On: 06/18/2020 01:01   DG Pelvis 1-2 Views  Result Date: 06/10/2020 CLINICAL DATA:  MRI clearance EXAM: PELVIS - 1-2 VIEW COMPARISON:  None. FINDINGS: There is no evidence of pelvic fracture or diastasis. No pelvic bone lesions are seen. IMPRESSION: No metallic foreign body. Electronically Signed   By: Ulyses Jarred M.D.   On: 06/10/2020 00:26   DG Abd 1 View  Result Date: 06/10/2020 CLINICAL DATA:  MRI clearance EXAM: ABDOMEN - 1 VIEW COMPARISON:  None. FINDINGS: The bowel gas pattern is normal. No radio-opaque calculi or other significant radiographic abnormality are seen. IMPRESSION: No metallic foreign body. Electronically Signed   By: Ulyses Jarred M.D.   On: 06/10/2020 00:27   CT HEAD WO CONTRAST  Result Date: 06/11/2020 CLINICAL DATA:  Sepsis.  Meningitis hydrocephalus. EXAM: CT HEAD WITHOUT CONTRAST TECHNIQUE: Contiguous axial  images were obtained from the base of the skull through the vertex without intravenous contrast. COMPARISON:  Head CT 06/09/2020.  MRI 06/10/2020. FINDINGS: Brain: The brain shows a normal appearance without evidence of malformation, atrophy, old or acute small or large vessel infarction, mass lesion, hemorrhage, hydrocephalus or extra-axial collection. Vascular: No hyperdense vessel. No evidence of atherosclerotic calcification. Skull: Normal.  No traumatic finding.  No focal bone lesion. Sinuses/Orbits: Sinuses are clear. Orbits appear normal. Mastoids are clear. Other: None significant IMPRESSION: Normal head CT. Electronically Signed   By: Nelson Chimes M.D.   On: 06/11/2020 15:47   CT Head Wo Contrast  Result Date: 06/09/2020 CLINICAL DATA:  Delirium EXAM: CT HEAD WITHOUT CONTRAST TECHNIQUE: Contiguous axial images were obtained from the base of the skull through the vertex without intravenous contrast. COMPARISON:  None. FINDINGS: Brain: No evidence of large-territorial acute infarction. No parenchymal hemorrhage. No mass lesion. No extra-axial collection. No mass effect or midline shift. No hydrocephalus. Basilar cisterns are patent. Vascular: No hyperdense vessel. Skull: No acute fracture or focal lesion. Sinuses/Orbits: Paranasal sinuses and mastoid air cells are  clear. The orbits are unremarkable. Other: None. IMPRESSION: No acute intracranial abnormality. Electronically Signed   By: Iven Finn M.D.   On: 06/09/2020 22:19   MR BRAIN WO CONTRAST  Result Date: 06/10/2020 CLINICAL DATA:  Altered mental status. Possible drug overdose. Seroquel. EXAM: MRI HEAD WITHOUT CONTRAST TECHNIQUE: Multiplanar, multiecho pulse sequences of the brain and surrounding structures were obtained without intravenous contrast. COMPARISON:  None. FINDINGS: Brain: No acute infarct, mass effect or extra-axial collection. No acute or chronic hemorrhage. Normal white matter signal, parenchymal volume and CSF spaces. The  midline structures are normal. Vascular: Major flow voids are preserved. Skull and upper cervical spine: Normal calvarium and skull base. Visualized upper cervical spine and soft tissues are normal. Sinuses/Orbits:No paranasal sinus fluid levels or advanced mucosal thickening. No mastoid or middle ear effusion. Normal orbits. IMPRESSION: Normal brain MRI. Electronically Signed   By: Ulyses Jarred M.D.   On: 06/10/2020 03:42   MR CERVICAL SPINE WO CONTRAST  Result Date: 06/17/2020 CLINICAL DATA:  Septic arthritis EXAM: MRI CERVICAL AND THORACIC SPINE WITHOUT CONTRAST TECHNIQUE: Multiplanar and multiecho pulse sequences of the cervical spine, to include the craniocervical junction and cervicothoracic junction, and the thoracic spine, were obtained without intravenous contrast. COMPARISON:  MRI June 11, 2020. FINDINGS: MRI CERVICAL SPINE FINDINGS Motion limited examination. Alignment: Normal. Vertebrae: Vertebral body heights are maintained. No focal marrow signal or disc signal abnormality to suggest discitis/osteomyelitis. Canal/Cord: Significantly limited evaluation given motion without definite cord signal abnormality or canal fluid collection. Posterior Fossa, vertebral arteries, paraspinal tissues: There is edema in the posterior paraspinal soft tissues in the upper cervical spine. No evidence of discrete fluid collection. Disc levels: C2-C3: Posterior disc osteophyte complex with mass effect on the ventral thecal sac. No significant foraminal stenosis. C3-C4: Left eccentric posterior disc osteophyte complex with mass effect on the thecal sac and deformity of the ventral cord with at least moderate canal stenosis. Bilateral facet and uncovertebral hypertrophy. Evaluation of the foramina is limited with possibly moderate bilateral foraminal stenosis C4-C5: Posterior disc osteophyte complex with mass effect on the thecal sac and suspected moderate canal stenosis. Bilateral facet and uncovertebral  hypertrophy. Evaluation of foramina is limited with suspected moderate bilateral foraminal stenosis. C5-C6: Posterior disc osteophyte complex with disc contacting the ventral cord and suspected moderate canal stenosis. Evaluation of the foramina is limited with possibly severe bilateral foraminal stenosis C6-C7: Posterior disc osteophyte complex with bilateral facet and uncovertebral hypertrophy. C7-T1: No significant disc protrusion, foraminal stenosis, or canal stenosis. MRI THORACIC SPINE FINDINGS Alignment:  Normal. Vertebrae: Vertebral body heights are maintained. No focal marrow edema to suggest osteomyelitis. Canal/Cord: There is a large fluid collection within the dorsal epidural space spanning the entire thoracic spine (C7-T1 to T12-L1), measuring up to 1.0 x 1.5 cm at T7-T8. The collection appears increased in size to the prior with increased effacement of CSF around the cord. The epidural fluid collection along with a posterior disc osteophyte at T6-T7 results in severe canal stenosis at this level with near complete effacement of CSF. There is mild canal stenosis at T2-T3, T3-T4, T10-T11 and T11-T12 and moderate canal stenosis at T4-T5, T5-T6, T7-T8, T8-T9, T9-T10. No evidence of abnormal cord signal. Paraspinal and other soft tissues: Edema in the posterior paraspinal soft tissues in the upper thoracic spine and lower thoracic spine. Nonspecific T2 hyperintense hepatic lesion, not well characterized on this study. Bibasilar lung opacities, better characterized on recent chest radiograph. Disc levels: Canal stenosis is detailed above. Suspected moderate left  foraminal stenosis at T8-T9. IMPRESSION: Motion limited study. 1. Large dorsal epidural fluid collection spanning the entirety of the thoracic spine, increased in size relative to February 16 MRI. The collection along with degenerative change results in severe canal stenosis at T6-T7 with near complete effacement of CSF and multilevel moderate canal  stenosis in the thoracic spine, progressed since the prior and detailed above. While nonspecific by imaging, findings are concerning for epidural abscess given the patient's clinical history. Postcontrast imaging could provide more complete evaluation if clinically indicated. 2. Edema within the paraspinal soft tissues in the upper cervical and thoracic spine and lower thoracic spine, concerning for infection/cellulitis given the clinical history. No discrete drainable fluid collection identified in the paraspinal soft tissues of the cervical or thoracic spine identified, although evaluation is limited by motion and absence of IV contrast. Please see separately dictated lumbar spine for infectious findings in the lumbar spine. 3. Evaluation of degenerative change in the cervical spine is significantly limited given motion. Multilevel suspected at least moderate canal stenosis, worst on the left at C3-C4 where posterior disc osteophyte contacts and deforms the left cord with effacement of the left root entry zone. Evaluation of the foramina is particularly limited, but there is suspected at least moderate multilevel foraminal stenosis in the cervical spine and on the left at T8-T9. Repeat MRI (possibly with sedation) could further characterize if clinically indicated. Critical findings discussed with Dr. Cheral Marker via telephone at 1:09 PM. Electronically Signed   By: Margaretha Sheffield MD   On: 06/17/2020 13:22   MR CERVICAL SPINE WO CONTRAST  Result Date: 06/11/2020 CLINICAL DATA:  Pneumococcal meningitis. Neck and back pain. Evaluate for epidural abscess. EXAM: MRI CERVICAL, THORACIC AND LUMBAR SPINE WITHOUT CONTRAST TECHNIQUE: Multiplanar and multiecho pulse sequences of the cervical spine, to include the craniocervical junction and cervicothoracic junction, and thoracic and lumbar spine, were obtained without intravenous contrast. COMPARISON:  None FINDINGS: MRI CERVICAL SPINE FINDINGS Limited examination due to  significant patient motion. Alignment: Normal alignment. Vertebrae: Grossly normal marrow signal. No bone lesions or fractures. No findings suspicious for discitis or osteomyelitis. Cord: Grossly normal cord signal intensity. No cord lesion or syrinx. Posterior Fossa, vertebral arteries, paraspinal tissues: No significant findings. Disc levels: C2-3: Shallow broad-based disc protrusion with mass effect on the ventral thecal sac. No significant foraminal stenosis. C3-4: Large central and left paracentral disc protrusion with mass effect on the thecal sac and flattening of the cervical cord. Mild foraminal stenosis bilaterally also. C4-5: Bulging degenerated annulus, osteophytic ridging and left paracentral disc protrusion with mass effect on the thecal sac. There is also mild bilateral foraminal stenosis. C5-6: Degenerated bulging annulus, central disc protrusion and facet disease with mild bilateral foraminal stenosis. C6-7: Bulging degenerated annulus and osteophytic ridging with flattening of the ventral thecal sac asymmetric left. Mild left foraminal stenosis. C7-T1: Bulging degenerated annulus and left paracentral disc protrusion with mild mass effect on the left side of thecal sac. No significant foraminal stenosis. MRI THORACIC SPINE FINDINGS Alignment:  Normal alignment of the thoracic vertebral bodies. Vertebrae: Normal marrow signal. Small scattered hemangiomas but no worrisome bone lesions or fractures. No findings suspicious for discitis or osteomyelitis. The facets are normally aligned. No facet or laminar fractures. No evidence of facet septic arthritis. Cord: Grossly normal thoracic spinal cord. No obvious cord lesions or syrinx. Paraspinal and other soft tissues: No significant paraspinal findings. Disc levels: Small central disc protrusion at T6-7. MRI LUMBAR SPINE FINDINGS Segmentation: There are five lumbar type  vertebral bodies. The last full intervertebral disc space is labeled L5-S1. Alignment:   Normal Vertebrae: No bone lesions or fractures. No findings suspicious for septic arthritis or osteomyelitis. Endplate reactive changes at L5-S1. Significant facet arthropathy noted on the left side at L2-3 and L3-4 with findings consistent with septic arthritis. Associated abscesses in the paraspinal muscles associated with both these levels. There is also significant surrounding myositis. Findings suspicious for a small epidural abscess on the left side at C2-3 associated with the infected left facet joint. No significant mass effect on the thecal sac. Conus medullaris and cauda equina: Conus extends to the L1-2 level. Conus and cauda equina appear normal. Paraspinal and other soft tissues: Abscesses in the left paraspinal muscles at L2, L3 and L4. Disc levels: L1-2: No significant findings. L2-3: No findings for discitis or osteomyelitis. Small posterior epidural abscess on the left side associated with the septic arthritis in the left L2-3 facet joint. No disc protrusions, spinal or foraminal stenosis. L3-4: No disc protrusions, spinal or foraminal stenosis. Septic arthritis involving the left L3-4 facet joint with an adjacent soft tissue abscess measuring approximately 18 x 15 mm. Smaller adjacent abscesses are also noted in the left paraspinal muscle. L4-5: No evidence of discitis or osteomyelitis. There is a shallow central disc protrusion with mass effect on the ventral thecal sac and mild bilateral lateral recess stenosis. No foraminal stenosis. The facet joints are intact at this level. There are some small abscesses in the left paraspinal muscle extending down just below the L4-5 disc space. L5-S1: Degenerative disc disease with endplate reactive changes. There is a shallow broad-based disc protrusion asymmetric left with mass effect on the thecal sac and on the left S1 nerve root. No foraminal stenosis. No septic arthritis involving the facet joints. IMPRESSION: 1. MR findings consistent with septic  arthritis involving the left L2-3 and L3-4 facet joints with associated abscess is in the left paraspinal muscles. 2. Suspect small epidural abscess posteriorly at L2-3 on the left side. 3. No definite MR findings for discitis or osteomyelitis in the cervical, thoracic or lumbar spines. 4. Degenerative cervical spondylosis with multilevel disc disease and facet disease. Multilevel disc protrusions most significant at C3-4. Electronically Signed   By: Marijo Sanes M.D.   On: 06/11/2020 15:06   MR THORACIC SPINE WO CONTRAST  Result Date: 06/17/2020 CLINICAL DATA:  Septic arthritis EXAM: MRI CERVICAL AND THORACIC SPINE WITHOUT CONTRAST TECHNIQUE: Multiplanar and multiecho pulse sequences of the cervical spine, to include the craniocervical junction and cervicothoracic junction, and the thoracic spine, were obtained without intravenous contrast. COMPARISON:  MRI June 11, 2020. FINDINGS: MRI CERVICAL SPINE FINDINGS Motion limited examination. Alignment: Normal. Vertebrae: Vertebral body heights are maintained. No focal marrow signal or disc signal abnormality to suggest discitis/osteomyelitis. Canal/Cord: Significantly limited evaluation given motion without definite cord signal abnormality or canal fluid collection. Posterior Fossa, vertebral arteries, paraspinal tissues: There is edema in the posterior paraspinal soft tissues in the upper cervical spine. No evidence of discrete fluid collection. Disc levels: C2-C3: Posterior disc osteophyte complex with mass effect on the ventral thecal sac. No significant foraminal stenosis. C3-C4: Left eccentric posterior disc osteophyte complex with mass effect on the thecal sac and deformity of the ventral cord with at least moderate canal stenosis. Bilateral facet and uncovertebral hypertrophy. Evaluation of the foramina is limited with possibly moderate bilateral foraminal stenosis C4-C5: Posterior disc osteophyte complex with mass effect on the thecal sac and suspected  moderate canal stenosis. Bilateral facet and  uncovertebral hypertrophy. Evaluation of foramina is limited with suspected moderate bilateral foraminal stenosis. C5-C6: Posterior disc osteophyte complex with disc contacting the ventral cord and suspected moderate canal stenosis. Evaluation of the foramina is limited with possibly severe bilateral foraminal stenosis C6-C7: Posterior disc osteophyte complex with bilateral facet and uncovertebral hypertrophy. C7-T1: No significant disc protrusion, foraminal stenosis, or canal stenosis. MRI THORACIC SPINE FINDINGS Alignment:  Normal. Vertebrae: Vertebral body heights are maintained. No focal marrow edema to suggest osteomyelitis. Canal/Cord: There is a large fluid collection within the dorsal epidural space spanning the entire thoracic spine (C7-T1 to T12-L1), measuring up to 1.0 x 1.5 cm at T7-T8. The collection appears increased in size to the prior with increased effacement of CSF around the cord. The epidural fluid collection along with a posterior disc osteophyte at T6-T7 results in severe canal stenosis at this level with near complete effacement of CSF. There is mild canal stenosis at T2-T3, T3-T4, T10-T11 and T11-T12 and moderate canal stenosis at T4-T5, T5-T6, T7-T8, T8-T9, T9-T10. No evidence of abnormal cord signal. Paraspinal and other soft tissues: Edema in the posterior paraspinal soft tissues in the upper thoracic spine and lower thoracic spine. Nonspecific T2 hyperintense hepatic lesion, not well characterized on this study. Bibasilar lung opacities, better characterized on recent chest radiograph. Disc levels: Canal stenosis is detailed above. Suspected moderate left foraminal stenosis at T8-T9. IMPRESSION: Motion limited study. 1. Large dorsal epidural fluid collection spanning the entirety of the thoracic spine, increased in size relative to February 16 MRI. The collection along with degenerative change results in severe canal stenosis at T6-T7 with  near complete effacement of CSF and multilevel moderate canal stenosis in the thoracic spine, progressed since the prior and detailed above. While nonspecific by imaging, findings are concerning for epidural abscess given the patient's clinical history. Postcontrast imaging could provide more complete evaluation if clinically indicated. 2. Edema within the paraspinal soft tissues in the upper cervical and thoracic spine and lower thoracic spine, concerning for infection/cellulitis given the clinical history. No discrete drainable fluid collection identified in the paraspinal soft tissues of the cervical or thoracic spine identified, although evaluation is limited by motion and absence of IV contrast. Please see separately dictated lumbar spine for infectious findings in the lumbar spine. 3. Evaluation of degenerative change in the cervical spine is significantly limited given motion. Multilevel suspected at least moderate canal stenosis, worst on the left at C3-C4 where posterior disc osteophyte contacts and deforms the left cord with effacement of the left root entry zone. Evaluation of the foramina is particularly limited, but there is suspected at least moderate multilevel foraminal stenosis in the cervical spine and on the left at T8-T9. Repeat MRI (possibly with sedation) could further characterize if clinically indicated. Critical findings discussed with Dr. Cheral Marker via telephone at 1:09 PM. Electronically Signed   By: Margaretha Sheffield MD   On: 06/17/2020 13:22   MR THORACIC SPINE WO CONTRAST  Result Date: 06/11/2020 CLINICAL DATA:  Pneumococcal meningitis. Neck and back pain. Evaluate for epidural abscess. EXAM: MRI CERVICAL, THORACIC AND LUMBAR SPINE WITHOUT CONTRAST TECHNIQUE: Multiplanar and multiecho pulse sequences of the cervical spine, to include the craniocervical junction and cervicothoracic junction, and thoracic and lumbar spine, were obtained without intravenous contrast. COMPARISON:  None  FINDINGS: MRI CERVICAL SPINE FINDINGS Limited examination due to significant patient motion. Alignment: Normal alignment. Vertebrae: Grossly normal marrow signal. No bone lesions or fractures. No findings suspicious for discitis or osteomyelitis. Cord: Grossly normal cord signal intensity.  No cord lesion or syrinx. Posterior Fossa, vertebral arteries, paraspinal tissues: No significant findings. Disc levels: C2-3: Shallow broad-based disc protrusion with mass effect on the ventral thecal sac. No significant foraminal stenosis. C3-4: Large central and left paracentral disc protrusion with mass effect on the thecal sac and flattening of the cervical cord. Mild foraminal stenosis bilaterally also. C4-5: Bulging degenerated annulus, osteophytic ridging and left paracentral disc protrusion with mass effect on the thecal sac. There is also mild bilateral foraminal stenosis. C5-6: Degenerated bulging annulus, central disc protrusion and facet disease with mild bilateral foraminal stenosis. C6-7: Bulging degenerated annulus and osteophytic ridging with flattening of the ventral thecal sac asymmetric left. Mild left foraminal stenosis. C7-T1: Bulging degenerated annulus and left paracentral disc protrusion with mild mass effect on the left side of thecal sac. No significant foraminal stenosis. MRI THORACIC SPINE FINDINGS Alignment:  Normal alignment of the thoracic vertebral bodies. Vertebrae: Normal marrow signal. Small scattered hemangiomas but no worrisome bone lesions or fractures. No findings suspicious for discitis or osteomyelitis. The facets are normally aligned. No facet or laminar fractures. No evidence of facet septic arthritis. Cord: Grossly normal thoracic spinal cord. No obvious cord lesions or syrinx. Paraspinal and other soft tissues: No significant paraspinal findings. Disc levels: Small central disc protrusion at T6-7. MRI LUMBAR SPINE FINDINGS Segmentation: There are five lumbar type vertebral bodies. The  last full intervertebral disc space is labeled L5-S1. Alignment:  Normal Vertebrae: No bone lesions or fractures. No findings suspicious for septic arthritis or osteomyelitis. Endplate reactive changes at L5-S1. Significant facet arthropathy noted on the left side at L2-3 and L3-4 with findings consistent with septic arthritis. Associated abscesses in the paraspinal muscles associated with both these levels. There is also significant surrounding myositis. Findings suspicious for a small epidural abscess on the left side at C2-3 associated with the infected left facet joint. No significant mass effect on the thecal sac. Conus medullaris and cauda equina: Conus extends to the L1-2 level. Conus and cauda equina appear normal. Paraspinal and other soft tissues: Abscesses in the left paraspinal muscles at L2, L3 and L4. Disc levels: L1-2: No significant findings. L2-3: No findings for discitis or osteomyelitis. Small posterior epidural abscess on the left side associated with the septic arthritis in the left L2-3 facet joint. No disc protrusions, spinal or foraminal stenosis. L3-4: No disc protrusions, spinal or foraminal stenosis. Septic arthritis involving the left L3-4 facet joint with an adjacent soft tissue abscess measuring approximately 18 x 15 mm. Smaller adjacent abscesses are also noted in the left paraspinal muscle. L4-5: No evidence of discitis or osteomyelitis. There is a shallow central disc protrusion with mass effect on the ventral thecal sac and mild bilateral lateral recess stenosis. No foraminal stenosis. The facet joints are intact at this level. There are some small abscesses in the left paraspinal muscle extending down just below the L4-5 disc space. L5-S1: Degenerative disc disease with endplate reactive changes. There is a shallow broad-based disc protrusion asymmetric left with mass effect on the thecal sac and on the left S1 nerve root. No foraminal stenosis. No septic arthritis involving the  facet joints. IMPRESSION: 1. MR findings consistent with septic arthritis involving the left L2-3 and L3-4 facet joints with associated abscess is in the left paraspinal muscles. 2. Suspect small epidural abscess posteriorly at L2-3 on the left side. 3. No definite MR findings for discitis or osteomyelitis in the cervical, thoracic or lumbar spines. 4. Degenerative cervical spondylosis with multilevel disc disease  and facet disease. Multilevel disc protrusions most significant at C3-4. Electronically Signed   By: Marijo Sanes M.D.   On: 06/11/2020 15:06   MR LUMBAR SPINE WO CONTRAST  Result Date: 06/16/2020 CLINICAL DATA:  Pneumococcal meningitis. Follow-up lumbar spine infection. New fever today. EXAM: MRI LUMBAR SPINE WITHOUT CONTRAST TECHNIQUE: Multiplanar, multisequence MR imaging of the lumbar spine was performed. No intravenous contrast was administered. COMPARISON:  Lumbar MRI 06/11/2020 FINDINGS: Segmentation:  Normal Alignment:  Normal Vertebrae: No evidence of discitis. No fracture or bone marrow edema. Conus medullaris and cauda equina: Conus extends to the L1-2 level. Conus and cauda equina appear normal. Paraspinal and other soft tissues: Posterior paraspinous muscle edema and multiple abscesses have progressed in the interval. Fluid collections are present in the muscles which appear to be associated with the facet joints on the left at L2-3 and L3-4. Abscesses appears slightly larger with increased surrounding edema. No right-sided abscess identified. Small epidural abscess posterior on the left at L2-3 is unchanged. Disc levels: L1-2: Negative L2-3: Shallow central disc protrusion with mild spinal stenosis. Fluid in the left facet joint which is contiguous with a posterior paraspinous muscle abscess. Small posterior epidural abscess on the left unchanged. L3-4: Shallow ventral epidural fluid collection has progressed compatible with abscess. Bilateral facet degeneration. Fluid in the left facet  joint appears contiguous with the posterior paraspinous muscle abscess. No significant spinal stenosis L4-5: Shallow central disc protrusion. Bilateral facet degeneration. Mild subarticular stenosis bilaterally L5-S1: Moderate disc degeneration with disc space narrowing. Shallow central and left-sided disc protrusion unchanged. Possible left S1 nerve root impingement. Bilateral mild facet degeneration. IMPRESSION: Findings compatible with lumbar spinal infection. There appears to be septic arthritis in the left L2-3 and L3-4 facets with adjacent paraspinous abscesses. Paraspinous abscesses and muscle edema appear mildly progressive. Ventral epidural abscess at L3-4 is small but appears mildly progressive. Small posterior epidural abscess on the left at L2-3 is unchanged. Electronically Signed   By: Franchot Gallo M.D.   On: 06/16/2020 16:06   MR LUMBAR SPINE WO CONTRAST  Result Date: 06/11/2020 CLINICAL DATA:  Pneumococcal meningitis. Neck and back pain. Evaluate for epidural abscess. EXAM: MRI CERVICAL, THORACIC AND LUMBAR SPINE WITHOUT CONTRAST TECHNIQUE: Multiplanar and multiecho pulse sequences of the cervical spine, to include the craniocervical junction and cervicothoracic junction, and thoracic and lumbar spine, were obtained without intravenous contrast. COMPARISON:  None FINDINGS: MRI CERVICAL SPINE FINDINGS Limited examination due to significant patient motion. Alignment: Normal alignment. Vertebrae: Grossly normal marrow signal. No bone lesions or fractures. No findings suspicious for discitis or osteomyelitis. Cord: Grossly normal cord signal intensity. No cord lesion or syrinx. Posterior Fossa, vertebral arteries, paraspinal tissues: No significant findings. Disc levels: C2-3: Shallow broad-based disc protrusion with mass effect on the ventral thecal sac. No significant foraminal stenosis. C3-4: Large central and left paracentral disc protrusion with mass effect on the thecal sac and flattening of  the cervical cord. Mild foraminal stenosis bilaterally also. C4-5: Bulging degenerated annulus, osteophytic ridging and left paracentral disc protrusion with mass effect on the thecal sac. There is also mild bilateral foraminal stenosis. C5-6: Degenerated bulging annulus, central disc protrusion and facet disease with mild bilateral foraminal stenosis. C6-7: Bulging degenerated annulus and osteophytic ridging with flattening of the ventral thecal sac asymmetric left. Mild left foraminal stenosis. C7-T1: Bulging degenerated annulus and left paracentral disc protrusion with mild mass effect on the left side of thecal sac. No significant foraminal stenosis. MRI THORACIC SPINE FINDINGS Alignment:  Normal alignment  of the thoracic vertebral bodies. Vertebrae: Normal marrow signal. Small scattered hemangiomas but no worrisome bone lesions or fractures. No findings suspicious for discitis or osteomyelitis. The facets are normally aligned. No facet or laminar fractures. No evidence of facet septic arthritis. Cord: Grossly normal thoracic spinal cord. No obvious cord lesions or syrinx. Paraspinal and other soft tissues: No significant paraspinal findings. Disc levels: Small central disc protrusion at T6-7. MRI LUMBAR SPINE FINDINGS Segmentation: There are five lumbar type vertebral bodies. The last full intervertebral disc space is labeled L5-S1. Alignment:  Normal Vertebrae: No bone lesions or fractures. No findings suspicious for septic arthritis or osteomyelitis. Endplate reactive changes at L5-S1. Significant facet arthropathy noted on the left side at L2-3 and L3-4 with findings consistent with septic arthritis. Associated abscesses in the paraspinal muscles associated with both these levels. There is also significant surrounding myositis. Findings suspicious for a small epidural abscess on the left side at C2-3 associated with the infected left facet joint. No significant mass effect on the thecal sac. Conus medullaris  and cauda equina: Conus extends to the L1-2 level. Conus and cauda equina appear normal. Paraspinal and other soft tissues: Abscesses in the left paraspinal muscles at L2, L3 and L4. Disc levels: L1-2: No significant findings. L2-3: No findings for discitis or osteomyelitis. Small posterior epidural abscess on the left side associated with the septic arthritis in the left L2-3 facet joint. No disc protrusions, spinal or foraminal stenosis. L3-4: No disc protrusions, spinal or foraminal stenosis. Septic arthritis involving the left L3-4 facet joint with an adjacent soft tissue abscess measuring approximately 18 x 15 mm. Smaller adjacent abscesses are also noted in the left paraspinal muscle. L4-5: No evidence of discitis or osteomyelitis. There is a shallow central disc protrusion with mass effect on the ventral thecal sac and mild bilateral lateral recess stenosis. No foraminal stenosis. The facet joints are intact at this level. There are some small abscesses in the left paraspinal muscle extending down just below the L4-5 disc space. L5-S1: Degenerative disc disease with endplate reactive changes. There is a shallow broad-based disc protrusion asymmetric left with mass effect on the thecal sac and on the left S1 nerve root. No foraminal stenosis. No septic arthritis involving the facet joints. IMPRESSION: 1. MR findings consistent with septic arthritis involving the left L2-3 and L3-4 facet joints with associated abscess is in the left paraspinal muscles. 2. Suspect small epidural abscess posteriorly at L2-3 on the left side. 3. No definite MR findings for discitis or osteomyelitis in the cervical, thoracic or lumbar spines. 4. Degenerative cervical spondylosis with multilevel disc disease and facet disease. Multilevel disc protrusions most significant at C3-4. Electronically Signed   By: Marijo Sanes M.D.   On: 06/11/2020 15:06   DG Chest Port 1 View  Result Date: 06/16/2020 CLINICAL DATA:  Fever. EXAM:  PORTABLE CHEST 1 VIEW COMPARISON:  06/09/2020 FINDINGS: The cardiomediastinal silhouette is within normal limits for portable AP technique. Lung volumes are low, and there are new mild opacities in both lung bases. No sizable pleural effusion or pneumothorax is identified. A new right PICC terminates over the SVC. IMPRESSION: Low lung volumes with mild bibasilar opacities favoring atelectasis although pneumonia is not excluded. Electronically Signed   By: Logan Bores M.D.   On: 06/16/2020 13:43   DG C-Arm 1-60 Min-No Report  Result Date: 06/18/2020 CLINICAL DATA:  Bilateral thoracic laminectomy for epidural abscess EXAM: THORACIC SPINE 2 VIEWS; DG C-ARM 1-60 MIN-NO REPORT COMPARISON:  MR  06/17/2020 FINDINGS: Intraoperative fluoroscopy was obtained during the process of a thoracic laminectomy for evacuation of an epidural abscess. Image #5 demonstrates a blunt surgical implement projects over the T4 vertebral level on the frontal radiograph. Surgical retractors seen posteriorly. Images labeled as #3,4 are more difficult to localize given the absence of localizing landmarks. Recommend correlation with operative report. IMPRESSION: Intraoperative fluoroscopy obtained during the process of a thoracic laminectomy for evacuation of an epidural abscess. Electronically Signed   By: Lovena Le M.D.   On: 06/18/2020 01:01   ECHOCARDIOGRAM COMPLETE  Result Date: 06/12/2020    ECHOCARDIOGRAM REPORT   Patient Name:   VARVARA LEGAULT Date of Exam: 06/11/2020 Medical Rec #:  [id number]       Height:       72.0 in Accession #:    [id number]      Weight:       245.0 lb Date of Birth:  30-Mar-1966       BSA:          2.322 m Patient Age:    55 years        BP:           126/73 mmHg Patient Gender: F               HR:           75 bpm. Exam Location:  [facility] Procedure: 2D Echo, Cardiac Doppler and Color Doppler Indications:     R78.81 Bacteremia  History:         Patient has no prior history of Echocardiogram examinations.                   Risk Factors:Hypertension and Diabetes. Hypothyroidism.  Sonographer:     Wilford Sports Rodgers-Jones Referring Phys:  [id number] [name] Diagnosing Phys: Ida Rogue MD IMPRESSIONS  1. Left ventricular ejection fraction, by estimation, is 50 to 55%. The left ventricle has low normal function. The left ventricle has no regional wall motion abnormalities. There is mild left ventricular hypertrophy. Left ventricular diastolic parameters were normal.  2. Right ventricular systolic function is normal. The right ventricular size is normal. There is normal pulmonary artery systolic pressure. The estimated right ventricular systolic pressure is 30.8 mmHg.  3. The mitral valve is normal in structure. Mild mitral valve regurgitation. No evidence of mitral stenosis.  4. The aortic valve is grossly normal in structure, though difficult to fully visualize. Aortic valve regurgitation is not visualized. No aortic stenosis is present.  5. Grossly, no valve vegetation noted. FINDINGS  Left Ventricle: Left ventricular ejection fraction, by estimation, is 50 to 55%. The left ventricle has low normal function. The left ventricle has no regional wall motion abnormalities. The left ventricular internal cavity size was normal in size. There is mild left ventricular hypertrophy. Left ventricular diastolic parameters were normal. Right Ventricle: The right ventricular size is normal. No increase in right ventricular wall thickness. Right ventricular systolic function is normal. There is normal pulmonary artery systolic pressure. The tricuspid regurgitant velocity is 2.13 m/s, and  with an assumed right atrial pressure of 5 mmHg, the estimated right ventricular systolic pressure is 65.7 mmHg. Left Atrium: Left atrial size was normal in size. Right Atrium: Right atrial size was normal in size. Pericardium: There is no evidence of pericardial effusion. Mitral Valve: The mitral valve is normal in structure. Mild mitral  valve regurgitation. No evidence of mitral valve stenosis. Tricuspid Valve: The tricuspid valve is normal in structure. Tricuspid  valve regurgitation is mild . No evidence of tricuspid stenosis. Aortic Valve: The aortic valve is normal in structure. Aortic valve regurgitation is not visualized. No aortic stenosis is present. Pulmonic Valve: The pulmonic valve was normal in structure. Pulmonic valve regurgitation is not visualized. No evidence of pulmonic stenosis. Aorta: The aortic root is normal in size and structure. Venous: The inferior vena cava is normal in size with greater than 50% respiratory variability, suggesting right atrial pressure of 3 mmHg. IAS/Shunts: No atrial level shunt detected by color flow Doppler.  LEFT VENTRICLE PLAX 2D LVIDd:         4.39 cm  Diastology LVIDs:         3.09 cm  LV e' medial:    7.40 cm/s LV PW:         1.03 cm  LV E/e' medial:  11.7 LV IVS:        1.10 cm  LV e' lateral:   9.36 cm/s LVOT diam:     1.80 cm  LV E/e' lateral: 9.3 LV SV:         56 LV SV Index:   24 LVOT Area:     2.54 cm  RIGHT VENTRICLE             IVC RV Basal diam:  3.51 cm     IVC diam: 2.46 cm RV S prime:     10.10 cm/s LEFT ATRIUM             Index       RIGHT ATRIUM           Index LA diam:        4.10 cm 1.77 cm/m  RA Area:     12.50 cm LA Vol (A2C):   63.4 ml 27.30 ml/m RA Volume:   30.60 ml  13.18 ml/m LA Vol (A4C):   39.6 ml 17.05 ml/m LA Biplane Vol: 53.7 ml 23.12 ml/m  AORTIC VALVE LVOT Vmax:   108.00 cm/s LVOT Vmean:  71.100 cm/s LVOT VTI:    0.219 m  AORTA Ao Root diam: 3.10 cm MITRAL VALVE               TRICUSPID VALVE MV Area (PHT): 4.21 cm    TR Peak grad:   18.1 mmHg MV Decel Time: 180 msec    TR Vmax:        213.00 cm/s MV E velocity: 86.80 cm/s MV A velocity: 57.70 cm/s  SHUNTS MV E/A ratio:  1.50        Systemic VTI:  0.22 m                            Systemic Diam: 1.80 cm Ida Rogue MD Electronically signed by Ida Rogue MD Signature Date/Time: 06/12/2020/7:33:57 AM     Final    [place] EKG SITE RITE  Result Date: 06/13/2020 If Site Rite image not attached, placement could not be confirmed due to current cardiac rhythm.  [place] EKG SITE RITE  Result Date: 06/13/2020 If Site Rite image not attached, placement could not be confirmed due to current cardiac rhythm.  [place] EKG SITE RITE  Result Date: 06/10/2020 If Site Rite image not attached, placement could not be confirmed due to current cardiac rhythm.  DG FL GUIDED LUMBAR PUNCTURE  Result Date: 06/10/2020 CLINICAL DATA:  Concern for meningitis EXAM: DIAGNOSTIC LUMBAR PUNCTURE UNDER FLUOROSCOPIC GUIDANCE FLUOROSCOPY TIME:  Fluoroscopy Time:  18 seconds  Radiation Exposure Index (if provided by the fluoroscopic device): 4.2 mGy PROCEDURE: Informed consent was obtained from the patient prior to the procedure, including potential complications of headache, allergy, and pain. With the patient prone, the lower back was prepped with Betadine. 1% Lidocaine was used for local anesthesia. Lumbar puncture was performed at the L2-L3 level using a 22 gauge needle with return of turbid CSF. 1 ml of CSF obtained for laboratory studies. The patient tolerated the procedure well and there were no apparent complications. IMPRESSION: Technically successful fluoroscopic guided lumbar puncture. Electronically Signed   By: Macy Mis M.D.   On: 06/10/2020 15:59   [place] FINE NEEDLE ASP 1ST LESION  Result Date: 06/19/2020 INDICATION: 55 year old female referred for aspiration of paraspinal fluid lumbar region EXAM: ULTRASOUND-GUIDED ASPIRATION MEDICATIONS: The patient is currently admitted to the hospital and receiving intravenous antibiotics. The antibiotics were administered within an appropriate time frame prior to the initiation of the procedure. ANESTHESIA/SEDATION: Fentanyl 25 mcg IV; Versed 0.5 mg IV Moderate Sedation Time:  12 minutes The patient was continuously monitored during the procedure by the interventional radiology nurse under my  direct supervision. COMPLICATIONS: None PROCEDURE: Informed written consent was obtained from the patient after a thorough discussion of the procedural risks, benefits and alternatives. All questions were addressed. Maximal Sterile Barrier Technique was utilized including caps, mask, sterile gowns, sterile gloves, sterile drape, hand hygiene and skin antiseptic. A timeout was performed prior to the initiation of the procedure. Patient position prone position with ultrasound images stored sent to PACs. Once the patient is prepped and draped in the usual sterile fashion, 1% lidocaine was used for local anesthesia. Small stab incision was made with 11 blade scalpel. Yueh needle was advanced with ultrasound guidance into the hypoechoic region within the paraspinal musculature. Two separate needle position was required for the initial aspiration of approximately 1 cc-2 cc of purulent fluid from the left-sided lumbar para muscular spina trip. Sample was sent for culture. A third needle position was achieved with another Yueh needle under ultrasound guidance, with some scant complex bloody fluid aspirated. Final image was stored. Patient tolerated the procedure well and remained hemodynamically stable throughout. No complications were encountered and no significant blood loss. IMPRESSION: Status post ultrasound-guided aspiration of paraspinal muscular fluid collection, yielding approximately 3 cc of purulent material. Sample was sent for culture. Overall ultrasound appearance is compatible with edema without a focal formed abscess. Signed, Dulcy Fanny. Dellia Nims, RPVI [facility] Electronically Signed   By: Corrie Mckusick D.O.   On: 06/19/2020 15:13    (Echo, Carotid, EGD, Colonoscopy, ERCP)    Subjective: Patient seen and examined.  No overnight events.  Significant other at the bedside.  Patient is frustrated.  Her cousin is a doctor and she wants to go home  and her cousin will give her physical therapy. Patient did agree ultimately to go to rehab.   Discharge Exam: Vitals:   06/27/20 0537 06/27/20 0805  BP: 132/84 (!) 131/93  Pulse: (!) 107 98  Resp: 16 16  Temp: 98.5 F (36.9 C) 97.9 F (36.6 C)  SpO2: (!) 87%    Vitals:   06/26/20 2156 06/26/20 2357 06/27/20 0537 06/27/20 0805  BP: (!) 154/85 (!) 126/51 132/84 (!) 131/93  Pulse: (!) 119 (!) 115 (!) 107 98  Resp: 20 16 16 16   Temp: 99.9 F (37.7 C) 98.3 F (36.8 C) 98.5 F (36.9 C) 97.9 F (36.6 C)  TempSrc: Oral Oral  Oral Oral  SpO2: 97% 91% (!) 87%   Weight:      Height:        General: Pt is alert, awake, not in acute distress Slightly anxious.  Flat affect. PICC line right arm. Cardiovascular: RRR, S1/S2 +, no rubs, no gallops Respiratory: CTA bilaterally, no wheezing, no rhonchi Abdominal: Soft, NT, ND, bowel sounds + Extremities: no edema, no cyanosis Patient has generalized weakness lower extremity.  Upper extremities normal. Patient has clean surgical incisions on her back with sutures intact and dry.    The results of significant diagnostics from this hospitalization (including imaging, microbiology, ancillary and laboratory) are listed below for reference.     Microbiology: Recent Results (from the past 240 hour(s))  Aerobic/Anaerobic Culture w Gram Stain (surgical/deep wound)     Status: None   Collection Time: 06/17/20  5:52 PM   Specimen: PATH Other; Body Fluid  Result Value Ref Range Status   Specimen Description   Final    WOUND Performed at [facility] [address] [place], [place] [address]    Special Requests   Final    EPIDURAL Performed at [facility], [address] [place], [place] [address]    Gram Stain   Final    ABUNDANT WBC PRESENT, PREDOMINANTLY PMN NO ORGANISMS SEEN    Culture   Final    No growth aerobically or anaerobically. Performed at [facility], [place] [address] [place], [place]  [address]    Report Status 06/22/2020 FINAL  Final  Aerobic/Anaerobic Culture w Gram Stain (surgical/deep wound)     Status: None   Collection Time: 06/17/20  5:55 PM   Specimen: PATH Other; Body Fluid  Result Value Ref Range Status   Specimen Description   Final    WOUND Performed at [facility], [address] [place], [place] [address]    Special Requests   Final    THORACIC Performed at [facility], [address] [place], [place] [address]    Gram Stain   Final    RARE WBC PRESENT, PREDOMINANTLY PMN NO ORGANISMS SEEN    Culture   Final    No growth aerobically or anaerobically. Performed at [facility], [place] [address] [place], [place] [address]    Report Status 06/22/2020 FINAL  Final  Aerobic/Anaerobic Culture (surgical/deep wound)     Status: None   Collection Time: 06/19/20 11:10 AM   Specimen: Abscess  Result Value Ref Range Status   Specimen Description   Final    ABSCESS LEFT LUMBAR FLUID/MYOSITIS Performed at [facility], [address] [place], [place] [address]    Special Requests   Final    NONE Performed at [facility], [address] [place], [place] [address]    Gram Stain   Final    MODERATE WBC PRESENT,BOTH PMN AND MONONUCLEAR NO ORGANISMS SEEN    Culture   Final    No growth aerobically or anaerobically. Performed at [facility], [place] [address] [place], [place] [address]    Report Status 06/24/2020 FINAL  Final     Labs: BNP (last 3 results) No results for input(s): BNP in the last 8760 hours. Basic Metabolic Panel: Recent Labs  Lab 06/21/20 0532 06/22/20 0601 06/23/20 0500 06/23/20 0630 06/24/20 0613 06/25/20 0616  NA 140 136  --  140 136 137  K 4.0 3.4*  --  3.0* 4.0 4.0  CL 107 107  --  111 105 106  CO2 26 22  --  21* 24 22  GLUCOSE 140* 215*  --  98 194* 130*  BUN 18 17  --  12 10 10   CREATININE 0.89 0.81  --  0.61 0.57 0.59  CALCIUM 8.1* 7.6*  --  6.6* 7.8* 8.1*  MG  1.7  --  1.3*  --  1.8 1.6*  PHOS  --   --   --   --  2.2*  --    Liver Function Tests: Recent Labs  Lab 06/21/20 0532 06/22/20 0601 06/23/20 0630 06/25/20 0616  AST 198* 131* 105* 62*  ALT 116* 109* 95* 92*  ALKPHOS 245* 237* 203* 231*  BILITOT 0.9 0.5 0.5 0.7  PROT 6.1* 5.8* 4.9* 6.2*  ALBUMIN 2.0* 1.9* 1.6* 2.2*   No results for input(s): LIPASE, AMYLASE in the last 168 hours. Recent Labs  Lab 06/21/20 0532  AMMONIA 10   CBC: Recent Labs  Lab 06/21/20 0532 06/22/20 0601 06/24/20 0613 06/25/20 0616  WBC 10.1 8.0 7.0 7.6  HGB 8.1* 7.7* 8.0* 8.2*  HCT 26.6* 24.8* 25.0* 26.6*  MCV 83.1 82.1 80.9 81.3  PLT 468* 466* 498* 495*   Cardiac Enzymes: No results for input(s): CKTOTAL, CKMB, CKMBINDEX, TROPONINI in the last 168 hours. BNP: Invalid input(s): POCBNP CBG: Recent Labs  Lab 06/25/20 2129 06/26/20 0913 06/26/20 1140 06/26/20 1553 06/27/20 0750  GLUCAP 293* 180* 236* 150* 205*   D-Dimer No results for input(s): DDIMER in the last 72 hours. Hgb A1c No results for input(s): HGBA1C in the last 72 hours. Lipid Profile No results for input(s): CHOL, HDL, LDLCALC, TRIG, CHOLHDL, LDLDIRECT in the last 72 hours. Thyroid function studies No results for input(s): TSH, T4TOTAL, T3FREE, THYROIDAB in the last 72 hours.  Invalid input(s): FREET3 Anemia work up No results for input(s): VITAMINB12, FOLATE, FERRITIN, TIBC, IRON, RETICCTPCT in the last 72 hours. Urinalysis    Component Value Date/Time   COLORURINE AMBER (A) 06/09/2020 2145   APPEARANCEUR CLOUDY (A) 06/09/2020 2145   LABSPEC 1.023 06/09/2020 2145   PHURINE 5.0 06/09/2020 2145   GLUCOSEU 150 (A) 06/09/2020 2145   HGBUR NEGATIVE 06/09/2020 2145   BILIRUBINUR NEGATIVE 06/09/2020 2145   [place] NEGATIVE 06/09/2020 2145   PROTEINUR 30 (A) 06/09/2020 2145   NITRITE NEGATIVE 06/09/2020 2145   LEUKOCYTESUR NEGATIVE 06/09/2020 2145   Sepsis Labs Invalid input(s): PROCALCITONIN,  WBC,   LACTICIDVEN Microbiology Recent Results (from the past 240 hour(s))  Aerobic/Anaerobic Culture w Gram Stain (surgical/deep wound)     Status: None   Collection Time: 06/17/20  5:52 PM   Specimen: PATH Other; Body Fluid  Result Value Ref Range Status   Specimen Description   Final    WOUND Performed at [facility], [address] [name], [place] [address]    Special Requests   Final    EPIDURAL Performed at [facility], [address] [place], [place] [address]    Gram Stain   Final    ABUNDANT WBC PRESENT, PREDOMINANTLY PMN NO ORGANISMS SEEN    Culture   Final    No growth aerobically or anaerobically. Performed at [facility], [place] [address], [place], [place] [address]    Report Status 06/22/2020 FINAL  Final  Aerobic/Anaerobic Culture w Gram Stain (surgical/deep wound)     Status: None   Collection Time: 06/17/20  5:55 PM   Specimen: PATH Other; Body Fluid  Result Value Ref Range Status   Specimen  Description   Final    WOUND Performed at [facility], [address] [place], [place] [address]    Special Requests   Final    THORACIC Performed at [facility], [address] [place], [place] [address]    Gram Stain   Final    RARE WBC PRESENT, PREDOMINANTLY PMN NO ORGANISMS SEEN    Culture   Final    No growth aerobically or anaerobically. Performed at [facility], [place] [address] [place], [place] [address]    Report Status 06/22/2020 FINAL  Final  Aerobic/Anaerobic Culture (surgical/deep wound)     Status: None   Collection Time: 06/19/20 11:10 AM   Specimen: Abscess  Result Value Ref Range Status   Specimen Description   Final    ABSCESS LEFT LUMBAR FLUID/MYOSITIS Performed at [facility], [address], [place], [place] [address]    Special Requests   Final    NONE Performed at [facility], [address] [place], [place] [address]    Gram Stain   Final    MODERATE WBC  PRESENT,BOTH PMN AND MONONUCLEAR NO ORGANISMS SEEN    Culture   Final    No growth aerobically or anaerobically. Performed at [facility], [place] [address] [place], [place] [address]    Report Status 06/24/2020 FINAL  Final     Time coordinating discharge:  40 minutes  SIGNED:   Barb Merino, MD  [facility] 06/27/2020, 10:04 AM

## 2020-06-27 NOTE — Progress Notes (Signed)
PMR Admission Coordinator Pre-Admission Assessment   Patient: Tonya Myers is an 55 y.o., female MRN: [id number] DOB: Mar 15, 1966 Height: 6' (182.9 cm) Weight: 104.3 kg                                                                                                                                                  Insurance Information HMO:     PPO:      PCP:      IPA:      80/20:      OTHER:  PRIMARY: Medicare a and b      Policy#: [id number]      Subscriber: pt Benefits:  Phone #: online     Name: 2/28 Eff. Date: 12/25/2009     Deduct: $1556      Out of Pocket Max: none      Life Max: none  [name]: 100%      SNF: 20 full days Outpatient: 80%     Co-Pay: 20% Home Health: 100%      Co-Pay: none DME: 80%     Co-Pay: 20% Providers: pt choice  SECONDARY: Comptroller      Policy#: [id number]   Financial Counselor:       Phone#:    The "Data Collection Information Summary" for patients in Inpatient Rehabilitation Facilities with attached "Privacy Act [place] Records" was provided and verbally reviewed with: Family   Emergency Contact Information         Contact Information     Name Relation Home Work Mobile    Gita Kudo Significant other [phone]   [phone]    Heron Nay     [phone]    Wrenley, Sayed Daughter     [phone]       Current Medical History  Patient Admitting Diagnosis: spinal epidural abscess s/p surgery   History of Present Illness:  55 year old right-handed female with history of diabetes mellitus, CLL, chronic back pain, hypertension.  Presented 06/09/2020 with altered mental status and aphasia.  Patient had evidently presented earlier that day and left without being seen after having labs done.  She returned to the ED was unable to fully express why she was there with altered mental status noted.  Complaints of neck pain.  Cranial CT scan negative.  MRI normal brain.  EEG negative for seizure.  Chest x-ray no active  disease.  Echocardiogram with ejection fraction of 50 to 55% no wall motion abnormalities.  Admission chemistries unremarkable aside glucose 274 creatinine 1.35, WBC 15,500 hemoglobin 10.1, troponin negative, hemoglobin A1c 8.1.  TSH 5.742, alkaline phosphatase 200 total bilirubin 2.1 direct bilirubin 1.2 SARS coronavirus negative, ammonia negative.  Patient subsequently spiked a fever as well as maintain on insulin therapy suspect DKA presentation.  She was placed on broad-spectrum antibiotics for fever and concern of meningitis   She underwent  LP.  CSF examination showed gram-positive diplococci.  MRI cervical lumbar thoracic spine showed findings compatible lumbar spinal infection.  There appeared to be septic arthritis in the left L2-3 and 3-4 facets with adjacent Paraspinous abscesses.  Ventral epidural abscess at L3-4 small but appears mildly progressive.  She was placed on broad-spectrum antibiotics for suspected meningitis.  Patient underwent T3-4 laminectomy, T5-7 laminectomy, 9-10 laminectomy evacuation of abscess 06/17/2020 per Dr. Lysle Morales with repeat lumbar paraspinal abscess aspiration by interventional radiology 06/19/2020.  Recommendations to continue ceftriaxone total of 6 weeks through 07/23/2020.Marland Kitchen  Patient was cleared to begin Lovenox for DVT prophylaxis.  Close monitoring of liver functions as they remained elevated but stable.  Past Medical History      Past Medical History:  Diagnosis Date  . Acute hemorrhoid 03/11/2015  . Anxiety    . Arthritis    . Asthma    . Cancer ([place])    . Chronic back pain    . CLL (chronic lymphocytic leukemia) ([place])    . Depression    . Diabetes mellitus without complication ([place])    . Genital herpes      type 2  . Hypertension    . Hypothyroidism    . Vertigo        Family History  family history includes Breast cancer (age of onset: 66) in her maternal aunt; Cancer in her paternal aunt.   Prior Rehab/Hospitalizations:  Has the patient had  prior rehab or hospitalizations prior to admission? Yes   Has the patient had major surgery during 100 days prior to admission? Yes   Current Medications    Current Facility-Administered Medications:  .  0.9 %  sodium chloride infusion, , Intravenous, Continuous, Cherene Altes, MD, Last Rate: 10 mL/hr at 06/25/20 0606, New Bag at 06/25/20 0606 .  acetaminophen (TYLENOL) tablet 325 mg, 325 mg, Oral, Q6H PRN, 325 mg at 06/22/20 2201 **OR** [DISCONTINUED] acetaminophen (TYLENOL) suppository 650 mg, 650 mg, Rectal, Q6H PRN, Marcelyn Bruins, MD, 650 mg at 06/14/20 0931 .  cefTRIAXone (ROCEPHIN) 2 g in sodium chloride 0.9 % 100 mL IVPB, 2 g, Intravenous, Q12H, Ravishankar, Jayashree, MD, Last Rate: 200 mL/hr at 06/27/20 0528, 2 g at 06/27/20 0528 .  chlorhexidine (PERIDEX) 0.12 % solution 15 mL, 15 mL, Mouth Rinse, BID, Sharion Settler, NP, 15 mL at 06/27/20 0915 .  Chlorhexidine Gluconate Cloth 2 % PADS 6 each, 6 each, Topical, Q0600, Sharion Settler, NP, 6 each at 06/27/20 0537 .  dextrose 50 % solution 0-50 mL, 0-50 mL, Intravenous, PRN, Marcelyn Bruins, MD .  enoxaparin (LOVENOX) injection 52.5 mg, 0.5 mg/kg, Subcutaneous, Q24H, Cherene Altes, MD, 52.5 mg at 06/27/20 0914 .  insulin aspart (novoLOG) injection 0-15 Units, 0-15 Units, Subcutaneous, TID WC, Cherene Altes, MD, 10 Units at 06/27/20 0900 .  insulin aspart (novoLOG) injection 0-5 Units, 0-5 Units, Subcutaneous, QHS, Cherene Altes, MD, 3 Units at 06/25/20 2133 .  insulin aspart (novoLOG) injection 5 Units, 5 Units, Subcutaneous, TID WC, Cherene Altes, MD, 5 Units at 06/27/20 0900 .  insulin glargine (LANTUS) injection 45 Units, 45 Units, Subcutaneous, Daily, Ghimire, Kuber, MD .  labetalol (NORMODYNE) injection 10 mg, 10 mg, Intravenous, Q2H PRN, Sharion Settler, NP, 10 mg at 06/16/20 0954 .  levothyroxine (SYNTHROID) tablet 150 mcg, 150 mcg, Oral, Q0600, Marcelyn Bruins, MD, 150 mcg at 06/27/20  0527 .  magnesium oxide (MAG-OX) tablet 400 mg, 400 mg, Oral, BID, Barb Merino, MD, 400  mg at 06/27/20 0908 .  MEDLINE mouth rinse, 15 mL, Mouth Rinse, q12n4p, Sharion Settler, NP, 15 mL at 06/26/20 1626 .  melatonin tablet 5 mg, 5 mg, Oral, QHS, Sharion Settler, NP, 5 mg at 06/26/20 2038 .  oxyCODONE (Oxy IR/ROXICODONE) immediate release tablet 5 mg, 5 mg, Oral, Q4H PRN, Loletha Grayer, MD, 5 mg at 06/27/20 0908 .  pantoprazole (PROTONIX) EC tablet 40 mg, 40 mg, Oral, Daily, Cherene Altes, MD, 40 mg at 06/27/20 0914 .  polyethylene glycol (MIRALAX / GLYCOLAX) packet 17 g, 17 g, Oral, Daily PRN, Marcelyn Bruins, MD, 17 g at 06/27/20 0909 .  sodium chloride flush (NS) 0.9 % injection 10-40 mL, 10-40 mL, Intracatheter, Q12H, Wieting, Richard, MD, 10 mL at 06/26/20 2038 .  sodium chloride flush (NS) 0.9 % injection 10-40 mL, 10-40 mL, Intracatheter, PRN, Loletha Grayer, MD, 10 mL at 06/13/20 2210 .  thiamine tablet 100 mg, 100 mg, Oral, Daily, Cherene Altes, MD, 100 mg at 06/27/20 0908 .  valACYclovir (VALTREX) tablet 1,000 mg, 1,000 mg, Oral, BID, Cherene Altes, MD, 1,000 mg at 06/27/20 0909   Patients Current Diet:     Diet Order                      Diet - low sodium heart healthy              Diet Carb Modified              Diet regular Room service appropriate? Yes with Assist; Fluid consistency: Thin  Diet effective now                      Precautions / Restrictions Precautions Precautions: Fall,Back Precaution Comments: thoracic hemovacs Restrictions Weight Bearing Restrictions: No    Has the patient had 2 or more falls or a fall with injury in the past year?No   Prior Activity Level Community (5-7x/wk): independent, active, loves to fish, retired from [employer] Level of Independence: Independent Comments: clarified by [place]: Did the patient need help bathing, dressing, using the toilet or  eating?  Independent   Indoor Mobility: Did the patient need assistance with walking from room to room (with or without device)? Independent   Stairs: Did the patient need assistance with internal or external stairs (with or without device)? Independent   Functional Cognition: Did the patient need help planning regular tasks such as shopping or remembering to take medications? Independent   Home Assistive Devices / Equipment Home Assistive Devices/Equipment: None Home Equipment: Shower seat,Grab bars - toilet   Prior Device Use: Indicate devices/aids used by the patient prior to current illness, exacerbation or injury? None of the above   Current Functional Level Cognition   Overall Cognitive Status: Impaired/Different from baseline Orientation Level: Oriented to person General Comments: difficulty maintaining focus and attention; some word finding/expressive difficulties    Extremity Assessment (includes Sensation/Coordination)   Upper Extremity Assessment: Generalized weakness RUE Deficits / Details: grossly 3-/5 throughout shoulder, elbow, wrist and hand; limited ability to sustain activation; able to detect light touch and localize R/L LUE Deficits / Details: grossly 2+ to 3-/5 throughout shoulder, elbow, wrist and hand; limited ability to sustain activation; able to detect light touch and localize R/L  Lower Extremity Assessment: Generalized weakness RLE Deficits / Details: grossly 3- to 3 throughout, able to activate against manual resistance for gross hip/knee extension  at times; intermittent difficulty with initiation/termination of isolated movement (slightly motor peseverative at times); able to detect light touch and accurately localize R/L LLE Deficits / Details: grossly 2+ to 3-/5 throughout, increased difficulty with motor planning and initiation/termination of isolated musculature; able to detect light touch and accurately localize R/L     ADLs   Overall ADL's : Needs  assistance/impaired Grooming: Wash/dry face,Set up,Bed level Grooming Details (indicate cue type and reason): Following set-up assistance, pt able to bring washcloth to face with increased time and effort Upper Body Dressing : Minimal assistance,Sitting Upper Body Dressing Details (indicate cue type and reason): Pt able to assist with threading arms through sleeves Lower Body Dressing: Maximal assistance,Bed level Lower Body Dressing Details (indicate cue type and reason): donning socks Toileting- Clothing Manipulation and Hygiene: Maximal assistance,Bed level,+2 for physical assistance Functional mobility during ADLs: Maximal assistance     Mobility   Overal bed mobility: Needs Assistance Bed Mobility: Supine to Sit,Sit to Supine Rolling: Min guard Supine to sit: Mod assist Sit to supine: Mod assist General bed mobility comments: Improved effort and performance in bed mobility     Transfers   Overall transfer level: Needs assistance Transfers: Lateral/Scoot Transfers  Lateral/Scoot Transfers: Max assist,+2 physical assistance General transfer comment: did not attempt     Ambulation / Gait / Stairs / Emergency planning/management officer   Ambulation/Gait General Gait Details: unsafe/unable     Posture / Balance Dynamic Sitting Balance Sitting balance - Comments: improved sitting balance Balance Overall balance assessment: Needs assistance Sitting-balance support: No upper extremity supported,Feet supported Sitting balance-Leahy Scale: Fair Sitting balance - Comments: improved sitting balance Standing balance comment: unsafe/unable     Special needs/care consideration Hgb A1c 8 Ceftriaxone I with LOT until 07/23/20 with PICC line    Previous Home Environment  Living Arrangements:  (Lives with [name], significant other)  Lives With: Significant other Available Help at Discharge: Family,Available 24 hours/day Type of Home: House Home Layout: Two level,Able to live on main level with  bedroom/bathroom Alternate Level Stairs-Rails:  (Pt unable to recall) Alternate Level Stairs-Number of Steps: 16 Home Access: Level entry Bathroom Shower/Tub: Multimedia programmer: Standard Bathroom Accessibility: Yes How Accessible: Accessible via walker Home Care Services: No   Discharge Living Setting Plans for Discharge Living Setting: Patient's home,Lives with (comment) ([name]) Type of Home at Discharge: House Discharge Home Layout: Two level,Able to live on main level with bedroom/bathroom Alternate Level Stairs-Number of Steps: 16 Discharge Home Access: Level entry Discharge Bathroom Shower/Tub: Walk-in shower Discharge Bathroom Toilet: Standard Discharge Bathroom Accessibility: Yes How Accessible: Accessible via walker Does the patient have any problems obtaining your medications?: No   Social/Family/Support Systems Contact Information: [name], boyfriend, 3 daughter in [place] Anticipated Caregiver: [place] Anticipated Ambulance person Information: see above Ability/Limitations of Caregiver: none Caregiver Availability: 24/7 Discharge Plan Discussed with Primary Caregiver: Yes Is Caregiver In Agreement with Plan?: Yes Does Caregiver/Family have Issues with Lodging/Transportation while Pt is in Rehab?: No   Goals Patient/Family Goal for Rehab: min assist with PT, OT, and SLP Expected length of stay: [facility]OS 3 to 4 weeks Pt/Family Agrees to Admission and willing to participate: Yes Program Orientation Provided & Reviewed with Pt/Caregiver Including Roles  & Responsibilities: Yes   Decrease burden of Care through IP rehab admission: n/a   Possible need for SNF placement upon discharge:not anticipated   Patient Condition: This patient's medical and functional status has changed since the consult dated: 06/24/2020 in which the Rehabilitation Physician determined and documented  that the patient's condition is appropriate for intensive rehabilitative care in an  inpatient rehabilitation facility. See "History of Present Illness" (above) for medical update. Functional changes are: pt max +2 for level transfers bed>chair. Patient's medical and functional status update has been discussed with the Rehabilitation physician and patient remains appropriate for inpatient rehabilitation. Will admit to inpatient rehab today.   Preadmission Screen Completed By: Danne Baxter RN MSN with updates by Michel Santee, PT, 06/27/2020 10:24 AM ______________________________________________________________________   Discussed status with Dr. Ranell Patrick on 06/27/20 at 10:24 AM  and received approval for admission today.   Admission Coordinator: Danne Baxter RN MSN with updates by Michel Santee, PT, DPT time 10:24 AM Sudie Grumbling 06/27/20

## 2020-06-27 NOTE — TOC Transition Note (Signed)
Transition of Care [facility]) - [name] Discharge Note   Patient Details  Name: Tonya Myers MRN: [id number] Date of Birth: [birth year]  Transition of Care [facility]) [name] Contact:  Shelbie Hutching, RN Phone Number: 06/27/2020, 11:06 AM   Clinical Narrative:    Patient is medically cleared for discharge and [name] has a bed available today.  Patient will transport to [name] via [facility].  [facility] transport arranged by [name], paperwork is ready for discharge.    Final next level of care: IP Rehab Facility Barriers to Discharge: Barriers Resolved   Patient Goals and CMS Choice Patient states their goals for this hospitalization and ongoing recovery are:: Patient is unable to voice goals- daughter is at the bedside and just wants the patient to get better CMS Medicare.gov Compare Post Acute Care list provided to:: Patient Represenative (must comment) Choice offered to / list presented to : Adult Children  Discharge Placement              Patient chooses bed at:  ([name]) Patient to be transferred to facility by: [place] Name of family member notified: Valentino Patient and family notified of of transfer: 06/27/20  Discharge Plan and Services   Discharge Planning Services: CM Consult Post Acute Care Choice: IP Rehab          DME Arranged: N/A DME Agency: NA       HH Arranged: NA          Social Determinants of Health (SDOH) Interventions     Readmission Risk Interventions No flowsheet data found.

## 2020-06-27 NOTE — Progress Notes (Signed)
Inpatient Rehab Admissions Coordinator:    I have a bed available for pt to admit to [name] today. Dr. Sloan Leiter in agreement.  Will let pt/family and TOC team know.   Shann Medal, PT, DPT Admissions Coordinator [phone] 06/27/20  10:16 AM

## 2020-06-28 DIAGNOSIS — G061 Intraspinal abscess and granuloma: Secondary | ICD-10-CM | POA: Diagnosis not present

## 2020-06-28 DIAGNOSIS — B9689 Other specified bacterial agents as the cause of diseases classified elsewhere: Secondary | ICD-10-CM | POA: Diagnosis not present

## 2020-06-28 LAB — GLUCOSE, CAPILLARY
Glucose-Capillary: 121 mg/dL — ABNORMAL HIGH (ref 70–99)
Glucose-Capillary: 266 mg/dL — ABNORMAL HIGH (ref 70–99)
Glucose-Capillary: 301 mg/dL — ABNORMAL HIGH (ref 70–99)
Glucose-Capillary: 106 mg/dL — ABNORMAL HIGH (ref 70–99)

## 2020-06-28 MED ORDER — LORAZEPAM 2 MG/ML IJ SOLN: 1.0000 mg | Freq: Four times a day (QID) | INTRAMUSCULAR | Status: DC | PRN

## 2020-06-28 MED ORDER — QUETIAPINE FUMARATE 25 MG PO TABS
25.0000 mg | ORAL_TABLET | Freq: Two times a day (BID) | ORAL | Status: DC
  Administered 2020-06-28 – 2020-07-31 (×67): 25 mg via ORAL
  Filled 2020-06-28 (×67): qty 1

## 2020-06-28 NOTE — Evaluation (Signed)
Occupational Therapy Assessment and Plan  Patient Details  Name: Tonya Myers MRN: [id number] Date of Birth: [birth year]  OT Diagnosis: altered mental status and cognitive deficits Rehab Potential: Rehab Potential (ACUTE ONLY): Poor ELOS: 3.5-4 weeks   Today's Date: 06/28/2020 OT Individual Time: 8295-6213 OT Individual Time Calculation (min): 54 min     Hospital Problem: Active Problems:   CLL (chronic lymphocytic leukemia) (HCC)   Acute hemorrhoid   DDD (degenerative disc disease), cervical   Cervical disc disorder with radiculopathy of cervical region   Cervical facet syndrome   DDD (degenerative disc disease), lumbar   Facet syndrome, lumbar   Bilateral occipital neuralgia   Sacroiliac joint dysfunction   DJD of shoulder   Hypothyroidism   Arthritis   Carpal tunnel syndrome, bilateral   Chronic pain in right shoulder   Controlled type 2 diabetes mellitus without complication, without long-term current use of insulin (HCC)   Essential hypertension   Herpes simplex vulvovaginitis   Pneumococcal meningitis   Bacterial spinal epidural abscess   Past Medical History:  Past Medical History:  Diagnosis Date  . Acute hemorrhoid 03/11/2015  . Anxiety   . Arthritis   . Asthma   . Cancer ([place])   . Chronic back pain   . CLL (chronic lymphocytic leukemia) ([place])   . Depression   . Diabetes mellitus without complication ([place])   . Genital herpes    type 2  . Hypertension   . Hypothyroidism   . Vertigo    Past Surgical History:  Past Surgical History:  Procedure Laterality Date  . ABDOMINAL HYSTERECTOMY    . BILATERAL SALPINGOOPHORECTOMY  2009  . BREAST BIOPSY Right 05/17/2016   FIBROADENOMATOUS CHANGE AND SCLEROSING ADENOSIS WITH  . COLONOSCOPY WITH PROPOFOL N/A 06/16/2015   Procedure: COLONOSCOPY WITH PROPOFOL;  Surgeon: [name], MD;  Location: [facility] ENDOSCOPY;  Service: Endoscopy;  Laterality: N/A;  . LAPAROSCOPIC SUPRACERVICAL HYSTERECTOMY  2009   due  to [place]  . OOPHORECTOMY    . THORACIC LAMINECTOMY FOR EPIDURAL ABSCESS Bilateral 06/17/2020   Procedure: THORACIC LAMINECTOMY FOR EPIDURAL ABSCESS;  Surgeon: Deetta Perla, MD;  Location: [facility] ORS;  Service: Neurosurgery;  Laterality: Bilateral;    Assessment & Plan Clinical Impression: Tonya Myers is a 55 year old right-handed female with history of diabetes mellitus, CLL, chronic back pain, hypertension.  Per chart review patient lives with spouse.  Two-level home bed and bath main level.  Independent prior to admission.  Presented 06/09/2020 with altered mental status and aphasia.  Patient had evidently presented earlier that day and left without being seen after having labs done.  She returned to the ED was unable to fully express why she was there with altered mental status noted.  Complaints of neck pain.  Cranial CT scan negative.  MRI normal brain.  EEG negative for seizure.  Chest x-ray no active disease.  Echocardiogram with ejection fraction of 50 to 55% no wall motion abnormalities.  Admission chemistries unremarkable aside glucose 274 creatinine 1.35, WBC 15,500 hemoglobin 10.1, troponin negative, hemoglobin A1c 8.1.  TSH 5.742, alkaline phosphatase 200 total bilirubin 2.1 direct bilirubin 1.2 SARS coronavirus negative, ammonia negative.  Patient subsequently spiked a fever as well as maintain on insulin therapy suspect DKA presentation.  She was placed on broad-spectrum antibiotics for fever and concern of meningitis   She underwent LP.  CSF examination showed gram-positive diplococci.  MRI cervical lumbar thoracic spine showed findings compatible lumbar spinal infection.  There appeared to be septic arthritis in the  left L2-3 and 3-4 facets with adjacent paraspinous abscesses.  Ventral epidural abscess at L3-4 small but appears mildly progressive.  She was placed on broad-spectrum antibiotics for suspected meningitis.  Patient underwent T3-4 laminectomy, T5-7 laminectomy, 9-10 laminectomy  evacuation of abscess 06/17/2020 per Dr. Lysle Morales with repeat lumbar paraspinal abscess aspiration by interventional radiology 06/19/2020.  Recommendations to continue ceftriaxone total of 6 weeks through 07/23/2020.Marland Kitchen  Patient was cleared to begin Lovenox for DVT prophylaxis.  Close monitoring of liver functions as they remained elevated but stable.  Due to patient's overall decrease in functional ability and need for physical assistance she was admitted for a comprehensive rehab program.  Patient was unwilling to participate during evaluation to assess ADL levels despite max encouragement and education.  Per chart, prior to hospitalization, patient could complete BADLs with independent .  Patient will benefit from skilled intervention to increase independence with basic self-care skills prior to discharge home with family support.  Anticipate patient will require 24 hour supervision and minimal physical assistance and follow up home health.  OT Assessment Rehab Potential (ACUTE ONLY): Poor OT Barriers to Discharge: Incontinence;Insurance for SNF coverage;Behavior OT Barriers to Discharge Comments: anticipate that pts biggest barrier will be her willingness to comply with interprofessional POC OT Patient demonstrates impairments in the following area(s): Behavior;Safety OT Basic ADL's Functional Problem(s): Eating;Grooming;Bathing;Dressing;Toileting OT Advanced ADL's Functional Problem(s): Simple Meal Preparation OT Transfers Functional Problem(s): Toilet;Tub/Shower OT Additional Impairment(s): Other (comment) (not thoroughly assessed) OT Plan OT Intensity: Minimum of 1-2 x/day, 45 to 90 minutes OT Frequency: 5 out of 7 days OT Duration/Estimated Length of Stay: 3.5-4 weeks OT Treatment/Interventions: Balance/vestibular training;Discharge planning;Pain management;Self Care/advanced ADL retraining;Therapeutic Activities;UE/LE Coordination activities;Therapeutic Exercise;Patient/family  education;Functional mobility training;Disease mangement/prevention;Cognitive remediation/compensation;Community reintegration;DME/adaptive equipment instruction;Psychosocial support;UE/LE Strength taining/ROM OT Self Feeding Anticipated Outcome(s): Min A OT Basic Self-Care Anticipated Outcome(s): Min A OT Toileting Anticipated Outcome(s): Min A OT Bathroom Transfers Anticipated Outcome(s): Min A OT Recommendation Recommendations for Other Services: Neuropsych consult Patient destination: Home (or SNF if pt cannot provide Min A + 24/7 support) Follow Up Recommendations: 24 hour supervision/assistance;Home health OT Equipment Recommended: To be determined   OT Evaluation Precautions/Restrictions  Precautions Precautions: Fall;Back Restrictions Weight Bearing Restrictions: No General OT Amount of Missed Time: 11 Minutes Pain Pain Assessment Pain Scale: Faces Faces Pain Scale: Hurts a little bit Pain Location: Leg Pain Orientation: Right;Left Pain Descriptors / Indicators: Discomfort Pain Onset: Gradual Pain Intervention(s): Medication (See eMAR) Home Living/Prior [place] expects to be discharged to:: Private residence Living Arrangements: Spouse/significant other Available Help at Discharge: Family,Available 24 hours/day Type of Home: House Home Access: Level entry Home Layout: Two level,Able to live on main level with bedroom/bathroom Alternate Level Stairs-Number of Steps: 16 (per chart) Bathroom Shower/Tub: Multimedia programmer: Standard  Lives With: Significant other (per chart, living with spouse Journalist, newspaper) IADL History Homemaking Responsibilities:  (unable to obtain PLOF information from pt due to unwillingness to participate/agitation) Prior Function Level of Independence: Independent with basic ADLs (per chart) Perception  Perception:  (unable to assess) Praxis Praxis:  (unable to assess) Cognition Arousal/Alertness:  Awake/alert Orientation Level:  (pt agitated when asked orientation information and also when provided with information, pt stating "do you think I'm stupid?") Immediate Memory Recall:  (BIMs not assessed due to pts unwillingness to participate/cooperate with events of eval) Sensation Sensation Light Touch: Not tested Coordination Gross Motor Movements are Fluid and Coordinated:  (unable to assess) Fine Motor Movements are Fluid and Coordinated:  (unable to  assess) Motor  Motor Motor:  (unable to thoroughly assess due to pts unwillingness to participate during eval)  Trunk/Postural Assessment     Balance Balance Balance Assessed: No Extremity/Trunk Assessment RUE Assessment RUE Assessment: Not tested LUE Assessment LUE Assessment: Not tested  Care Tool Care Tool Self Care Eating  refused      Oral Care  Oral care, brush teeth, clean dentures activity did not occur: Refused      Bathing Bathing activity did not occur: Refused            Upper Body Dressing(including orthotics) Upper body dressing/undressing activity did not occur (including orthotics): Refused          Lower Body Dressing (excluding footwear) Lower body dressing activity did not occur: Refused        Putting on/Taking off footwear Putting on/taking off footwear activity did not occur: Refused           Care Tool Toileting Toileting activity Toileting Activity did not occur Landscape architect and hygiene only): Museum/gallery conservator transfer activity did not occur: Refused         Refer to Care Plan for Long Term Goals  SHORT TERM GOAL WEEK 1 OT Short Term Goal 1 (Week 1): Pt will maintain sitting balance EOB with Mod A for ~5 minutes during self care activity OT Short Term Goal 2 (Week 1): Pt will complete 1 grooming task w/c level at the sink with no more than Mod A and mod cues OT Short Term Goal 3 (Week 1): Pt will remain OOB for ~45 minutes while engaging  in ADL activity  Recommendations for other services: Neuropsych   Skilled Therapeutic Intervention Skilled OT session completed with focus on initial evaluation, education on OT role/POC, and establishment of patient-centered goals.  Pt greeted in the room, nursing finishing up with perihygiene/brief change with 3 staff members assisting. Note that pt was calling out "Help!" and "[name]!" and "I'm calling the police!" OT and staff reassuring pt that she was safe and in the hospital. Pt responding "Do you think I'm stupid?" and "Don't say that." OT assisted nursing staff with obtaining vitals, pt with aggressive behaviors, flailing limbs, grabbing at staff and pulling at lines to BP cuff and pulse ox. Pt continued to shout "I'm calling the police" also said to staff "you're devils" and "you won't get away with this." Once vitals were obtained with great effort, OT encouraged pt to engage in eating her breakfast or any bedlevel/OOB self care activity. Pt repeatedly told OT to "get out." Tried to calmly establish rapport with pt and orient her to situation with pt stating"I know what you're doing and it won't work." She wasn't able to tell OT why she was hospitalized. When OT suggested we call her husband, pt was quiet and turned her head away. Nursing assisted OT with finding husband [name]' cell phone number and we had the call transferred to her room. OT assisted pt with answering the phone in order to talk to him. Spouse reported that he encouraged pt to participate in therapies and comply during nursing care while on the phone with OT afterwards. Pt still with guarded demeanor after phone call with spouse [name]. She would not answer any more questions from OT but did state "no" when asked if she was willing to participate. Time missed due to pt refusal. Pt remained in bed with call bell within reach and bed  alarm set.  ADL ADL ADL Comments: Unable to assess any ADL areas due to pts unwillingness to  participate/agitation and aggressive behaviors     Discharge Criteria: Patient will be discharged from OT if patient refuses treatment 3 consecutive times without medical reason, if treatment goals not met, if there is a change in medical status, if patient makes no progress towards goals or if patient is discharged from hospital.  The above assessment, treatment plan, treatment alternatives and goals were discussed and mutually agreed upon: by patient and by family  Skeet Simmer 06/28/2020, 12:34 PM

## 2020-06-28 NOTE — Plan of Care (Signed)
  Problem: Consults Goal: RH GENERAL PATIENT EDUCATION Description: See Patient Education module for education specifics. Outcome: Progressing   Problem: RH BOWEL ELIMINATION Goal: RH STG MANAGE BOWEL WITH ASSISTANCE Description: STG Manage Bowel with min Assistance. Outcome: Progressing   Problem: RH SKIN INTEGRITY Goal: RH STG SKIN FREE OF INFECTION/BREAKDOWN Description: No new skin breakdown this admission  Outcome: Progressing Goal: RH STG MAINTAIN SKIN INTEGRITY WITH ASSISTANCE Description: STG Maintain Skin Integrity With min Assistance. Outcome: Progressing Goal: RH STG ABLE TO PERFORM INCISION/WOUND CARE W/ASSISTANCE Description: STG Able To Perform Incision/Wound Care With max Assistance. Outcome: Progressing   Problem: RH PAIN MANAGEMENT Goal: RH STG PAIN MANAGED AT OR BELOW PT'S PAIN GOAL Description: Less than 3 out of 10 Outcome: Progressing   Problem: RH KNOWLEDGE DEFICIT GENERAL Goal: RH STG INCREASE KNOWLEDGE OF SELF CARE AFTER HOSPITALIZATION Description: Min assist Outcome: Progressing   Problem: RH BLADDER ELIMINATION Goal: RH STG MANAGE BLADDER WITH ASSISTANCE Description: STG Manage Bladder With min Assistance Outcome: Not Progressing   Problem: RH SAFETY Goal: RH STG ADHERE TO SAFETY PRECAUTIONS W/ASSISTANCE/DEVICE Description: STG Adhere to Safety Precautions With min Assistance/Device. Outcome: Not Progressing

## 2020-06-28 NOTE — Progress Notes (Signed)
PROGRESS NOTE   Subjective/Complaints: Agitated per RN last noc, per husband no prior hx of taking anxiety meds, substance abuse, psych hx Husband felt oxycodone caused some confusion  Pt pulled out PICC line last noc, states she doesn't need it.  States that "other doctors" said she could go home and not require hospital based rehab.  Husband states that no other doctors said this an dall have recommended IV antibiotics Discussed that current orders reflect the recommendations of ID MD as well as surgeon Objective:   No results found. No results for input(s): WBC, HGB, HCT, PLT in the last 72 hours. No results for input(s): NA, K, CL, CO2, GLUCOSE, BUN, CREATININE, CALCIUM in the last 72 hours.  Intake/Output Summary (Last 24 hours) at 06/28/2020 1129 Last data filed at 06/28/2020 0900 Gross per 24 hour  Intake 0 ml  Output 625 ml  Net -625 ml        Physical Exam: Vital Signs Blood pressure (!) 152/110, pulse (!) 110, temperature 97.6 F (36.4 C), temperature source Oral, resp. rate 18, height 6' (1.829 m), weight 99.7 kg, SpO2 100 %.  General: No acute distress Mood and affect are appropriate Heart: Regular rate and rhythm no rubs murmurs or extra sounds Lungs: Clear to auscultation, breathing unlabored, no rales or wheezes Abdomen: Positive bowel sounds, soft nontender to palpation, nondistended Extremities: No clubbing, cyanosis, or edema Skin: No evidence of breakdown, no evidence of rash Neurologic: Cranial nerves II through XII intact, motor strength is 4/5 in bilateral deltoid, bicep, tricep, grip, hip flexor, knee extensors, ankle dorsiflexor and plantar flexor Sensory exam normal sensation to light touch and proprioception in bilateral upper and lower extremities Cerebellar exam limb ataxia x 4 ext    Musculoskeletal: No pain with ROM . No joint swelling Poor insight and judgement, +  confabulation   Assessment/Plan: 1. Functional deficits which require 3+ hours per day of interdisciplinary therapy in a comprehensive inpatient rehab setting.  Physiatrist is providing close team supervision and 24 hour management of active medical problems listed below.  Physiatrist and rehab team continue to assess barriers to discharge/monitor patient progress toward functional and medical goals  Care Tool:  Bathing              Bathing assist       Upper Body Dressing/Undressing Upper body dressing   What is the patient wearing?: Hospital gown only    Upper body assist Assist Level: 2 Helpers    Lower Body Dressing/Undressing Lower body dressing      What is the patient wearing?: Incontinence brief     Lower body assist Assist for lower body dressing: 2 Helpers     Toileting Toileting    Toileting assist Assist for toileting: 2 Helpers     Transfers Chair/bed transfer  Transfers assist           Locomotion Ambulation   Ambulation assist              Walk 10 feet activity   Assist           Walk 50 feet activity   Assist  Walk 150 feet activity   Assist           Walk 10 feet on uneven surface  activity   Assist           Wheelchair     Assist               Wheelchair 50 feet with 2 turns activity    Assist            Wheelchair 150 feet activity     Assist          Blood pressure (!) 152/110, pulse (!) 110, temperature 97.6 F (36.4 C), temperature source Oral, resp. rate 18, height 6' (1.829 m), weight 99.7 kg, SpO2 100 %.      Medical Problem List and Plan: 1.  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  Status post T3-4, T5-7, T9-10 laminectomy with evacuation of abscess with repeat lumbar paraspinal abscess aspiration by interventional radiology  06/19/2020.  Orders to remove sutures 07/01/2020.             -patient may shower but incision must be covered             -ELOS/Goals: MinA 3-4 weeks 2.  Antithrombotics: -DVT/anticoagulation: Continue Lovenox 52.5 mg daily.              -antiplatelet therapy: N/A 3. Pain Management: Oxycodone as needed 4. Mood: Melatonin 5 mg nightly             -antipsychotic agents: N/A 5. Neuropsych: This patient is not capable of making decisions on her own behalf. 6. Skin/Wound Care: Routine skin checks 7. Fluids/Electrolytes/Nutrition: Routine in and outs with follow-up chemistries 8.  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  Continue Rocephin through 07/23/2020 and follow-up infectious disease. 9.  Diabetes mellitus/DKA.  Hemoglobin A1c 8.0.  NovoLog 5 units 3 times daily, Lantus insulin 45 units daily.  Check blood sugars before meals and at bedtime CBG (last 3)  Recent Labs    06/26/20 1553 06/27/20 0750 06/28/20 0754  GLUCAP 150* 205* 121*    10.  CLL.  Follow-up outpatient Dr. Delight Hoh 11.  Hypothyroidism.  Synthroid 12.  Anemia of chronic disease.  Multifactorial.  Follow-up CBC 13.  Hypertension.  Dilacor120 mg daily,Micardis 80-25 mg daily             3/4: BP well controlled 14.  Transaminitis.  Follow-up chemistries 15.  Obesity.  BMI 31.19.  Dietary follow-up 16.  History of asthma.  Continue Singulair 10 mg nightly as well as Symbicort 160-4.5 mcg.  Resume as needed 17.  History of genital herpes.  Conitnue Valtrex  LOS: 1 days A FACE TO FACE EVALUATION WAS PERFORMED  Charlett Blake 06/28/2020, 11:29 AM

## 2020-06-28 NOTE — Progress Notes (Signed)
Pt overall noncompliant with plan of care and disoriented to situation and place. Pt refused all measures of care through the night including vitals, medication and CBG checks. Pt states they will only comply to plane of care  with the Physician and Husband, [name]. Hourly Rounding and reassessments completed to address pt needs. Will continue to monitor.

## 2020-06-28 NOTE — Progress Notes (Signed)
Significant other at bedside today. Patient much more cooperative and compliant with care while he is present. All medications administered. IV placed to Lt arm. Area wrapped with kerlix for protection. Patient and family instructed on importance of  IV antibiotics at this time. Patient incontinent of bowel and bladder this shift-changed x3.

## 2020-06-28 NOTE — Progress Notes (Signed)
SLP Cancellation Note  Patient Details Name: Tonya Myers MRN: [id number] DOB: 04/22/1966   Cancelled treatment:        Attempted to see pt for scheduled evaluation.  Pt sleeping soundly upon therapist's arrival with nursing at bedside, administering IV antibiotics.  Pt would awaken briefly, request to be allowed to rest, and then fall asleep after ~1-3 minute intervals. SLP received reports from OT and nursing that pt was agitated and combative all morning following poor sleep last night.  Spoke briefly with pt's significant other, Tonya Myers about pt's baseline functioning. (Retired, loves fishing and spending time with her grandkids who live nearby).   Will defer evaluation at this time until pt is more alert and able to fully participate.                                                                                                  Alexei Ey, Selinda Orion 06/28/2020, 1:34 PM

## 2020-06-28 NOTE — Evaluation (Signed)
Physical Therapy Assessment and Plan  Patient Details  Name: Tonya Myers MRN: [id number] Date of Birth: 08-Feb-1966  PT Diagnosis: Abnormal posture, Abnormality of gait, Impaired cognition and Muscle weakness Rehab Potential: Fair ELOS: 3.5 weeks   Today's Date: 06/28/2020 PT Individual Time: 1045-1200 PT Individual Time Calculation (min): 75 min    Hospital Problem: Active Problems:   CLL (chronic lymphocytic leukemia) (HCC)   Acute hemorrhoid   DDD (degenerative disc disease), cervical   Cervical disc disorder with radiculopathy of cervical region   Cervical facet syndrome   DDD (degenerative disc disease), lumbar   Facet syndrome, lumbar   Bilateral occipital neuralgia   Sacroiliac joint dysfunction   DJD of shoulder   Hypothyroidism   Arthritis   Carpal tunnel syndrome, bilateral   Chronic pain in right shoulder   Controlled type 2 diabetes mellitus without complication, without long-term current use of insulin (HCC)   Essential hypertension   Herpes simplex vulvovaginitis   Pneumococcal meningitis   Bacterial spinal epidural abscess   Past Medical History:  Past Medical History:  Diagnosis Date  . Acute hemorrhoid 03/11/2015  . Anxiety   . Arthritis   . Asthma   . Cancer ([place])   . Chronic back pain   . CLL (chronic lymphocytic leukemia) ([place])   . Depression   . Diabetes mellitus without complication ([place])   . Genital herpes    type 2  . Hypertension   . Hypothyroidism   . Vertigo    Past Surgical History:  Past Surgical History:  Procedure Laterality Date  . ABDOMINAL HYSTERECTOMY    . BILATERAL SALPINGOOPHORECTOMY  2009  . BREAST BIOPSY Right 05/17/2016   FIBROADENOMATOUS CHANGE AND SCLEROSING ADENOSIS WITH  . COLONOSCOPY WITH PROPOFOL N/A 06/16/2015   Procedure: COLONOSCOPY WITH PROPOFOL;  Surgeon: [name], MD;  Location: [facility] ENDOSCOPY;  Service: Endoscopy;  Laterality: N/A;  . LAPAROSCOPIC SUPRACERVICAL HYSTERECTOMY  2009   due to  [place]  . OOPHORECTOMY    . THORACIC LAMINECTOMY FOR EPIDURAL ABSCESS Bilateral 06/17/2020   Procedure: THORACIC LAMINECTOMY FOR EPIDURAL ABSCESS;  Surgeon: Deetta Perla, MD;  Location: [facility] ORS;  Service: Neurosurgery;  Laterality: Bilateral;    Assessment & Plan Clinical Impression: Tonya Myers is a 55 year old right-handed female with history of diabetes mellitus, CLL, chronic back pain, hypertension.  Per chart review patient lives with spouse.  Two-level home bed and bath main level.  Independent prior to admission.  Presented 06/09/2020 with altered mental status and aphasia.  Patient had evidently presented earlier that day and left without being seen after having labs done.  She returned to the ED was unable to fully express why she was there with altered mental status noted.  Complaints of neck pain.  Cranial CT scan negative.  MRI normal brain.  EEG negative for seizure.  Chest x-ray no active disease.  Echocardiogram with ejection fraction of 50 to 55% no wall motion abnormalities.  Admission chemistries unremarkable aside glucose 274 creatinine 1.35, WBC 15,500 hemoglobin 10.1, troponin negative, hemoglobin A1c 8.1.  TSH 5.742, alkaline phosphatase 200 total bilirubin 2.1 direct bilirubin 1.2 SARS coronavirus negative, ammonia negative.  Patient subsequently spiked a fever as well as maintain on insulin therapy suspect DKA presentation.  She was placed on broad-spectrum antibiotics for fever and concern of meningitis   She underwent LP.  CSF examination showed gram-positive diplococci.  MRI cervical lumbar thoracic spine showed findings compatible lumbar spinal infection.  There appeared to be septic arthritis in the left  L2-3 and 3-4 facets with adjacent paraspinous abscesses.  Ventral epidural abscess at L3-4 small but appears mildly progressive.  She was placed on broad-spectrum antibiotics for suspected meningitis.  Patient underwent T3-4 laminectomy, T5-7 laminectomy, 9-10 laminectomy  evacuation of abscess 06/17/2020 per Dr. Lysle Morales with repeat lumbar paraspinal abscess aspiration by interventional radiology 06/19/2020.  Recommendations to continue ceftriaxone total of 6 weeks through 07/23/2020.Marland Kitchen  Patient was cleared to begin Lovenox for DVT prophylaxis.  Close monitoring of liver functions as they remained elevated but stable.  Due to patient's overall decrease in functional ability and need for physical assistance she was admitted for a comprehensive rehab program..  Patient transferred to [name] on 06/27/2020 .   Patient currently requires max with mobility secondary to muscle weakness, decreased cardiorespiratoy endurance, unbalanced muscle activation, ataxia, decreased coordination and decreased motor planning, decreased visual perceptual skills, decreased motor planning and decreased initiation, decreased attention, decreased awareness, decreased problem solving, decreased safety awareness, decreased memory and delayed processing.  Prior to hospitalization, patient was independent  with mobility and lived with Significant other in a House home.  Home access is  Level entry.  Patient will benefit from skilled PT intervention to maximize safe functional mobility, minimize fall risk and decrease caregiver burden for planned discharge home with 24 hour assist.  Anticipate patient will benefit from follow up [facility] at discharge.  PT - End of Session Activity Tolerance: Tolerates 30+ min activity with multiple rests Endurance Deficit: Yes Endurance Deficit Description: Pt w/widly varying functional performance during session due to fatigue as well as cognition.  Severely deconditioned PT Assessment Rehab Potential (ACUTE/IP ONLY): Fair PT Barriers to Discharge: Inaccessible home environment;Weight;Behavior PT Barriers to Discharge Comments: Pt is naturaly large in stature/55f 1in, confusion and agitation limit participation overall w/other therapiies today, PT Patient demonstrates  impairments in the following area(s): Balance;Safety;Behavior;Endurance;Motor;Pain;Perception;Other (comment) (cognition,) PT Transfers Functional Problem(s): Bed Mobility;Bed to Chair;Car;Furniture PT Locomotion Functional Problem(s): Ambulation;Wheelchair Mobility;Stairs PT Plan PT Intensity: Minimum of 1-2 x/day ,45 to 90 minutes PT Frequency: 5 out of 7 days PT Duration Estimated Length of Stay: 3.5 weeks PT Treatment/Interventions: Ambulation/gait training;Community reintegration;DME/adaptive equipment instruction;Neuromuscular re-education;Psychosocial support;Stair training;UE/LE Strength taining/ROM;Wheelchair propulsion/positioning;Balance/vestibular training;Discharge planning;Pain management;Therapeutic Activities;UE/LE Coordination activities;Cognitive remediation/compensation;Functional mobility training;Patient/family education;Therapeutic Exercise;Visual/perceptual remediation/compensation PT Transfers Anticipated Outcome(s): min assist PT Locomotion Anticipated Outcome(s): min assist PT Recommendation Recommendations for Other Services: Neuropsych consult Follow Up Recommendations: Home health PT Patient destination: Home Equipment Recommended: To be determined   PT Evaluation Precautions/Restrictions Precautions Precautions: Fall;Back Restrictions Weight Bearing Restrictions: No Other Position/Activity Restrictions: soinal General   Vital SignsTherapy Vitals Pulse Rate: (!) 103 Resp: 18 BP: (!) 105/55 Patient Position (if appropriate): Lying Oxygen Therapy SpO2: 100 % O2 Device: Room Air Pain Pain Assessment Pain Scale: Faces Faces Pain Scale: Hurts little more Pain Type: Acute pain (with activity) Pain Location: Back Home Living/Prior Functioning Home Living Available Help at Discharge: Family;Available 24 hours/day Type of Home: House Home Access: Level entry Home Layout: Two level;Able to live on main level with bedroom/bathroom Alternate Level  Stairs-Number of Steps: 16 (per chart) Bathroom Shower/Tub: WMultimedia programmer Standard Bathroom Accessibility: Yes  Lives With: Significant other Prior Function Level of Independence: Independent with basic ADLs  Able to Take Stairs?: Yes Vision/Perception     Cognition Overall Cognitive Status: Impaired/Different from baseline Arousal/Alertness: Awake/alert Orientation Level: Oriented to person;Oriented to place Attention: Focused;Sustained Focused Attention: Impaired Sustained Attention: Impaired Sensation Sensation Light Touch: Appears Intact Proprioception:  (unable to assess due to cognition)  Motor  Motor Motor: Abnormal postural alignment and control;Ataxia Motor - Skilled Clinical Observations: severe deconditioning w/core and extremity weakness x 4, proximal deficits > distal   Trunk/Postural Assessment  Cervical Assessment Cervical Assessment: Exceptions to [facility] (upright when supported sitting in wc, downward gaze when unsupported, will lean to elbows w/head supported in hands at times) Thoracic Assessment Thoracic Assessment: Exceptions to [facility] (rounded, will lean posteriorly w/fatigue) Lumbar Assessment Lumbar Assessment: Exceptions to [facility] (post rotation of pelvis) Postural Control Postural Control: Deficits on evaluation Trunk Control: core weakness, can sit w/min assist at times, max assist w/fatigue Righting Reactions: very delayed throughout Protective Responses: very imapaired throughout  Balance Balance Balance Assessed: Yes Static Sitting Balance Static Sitting - Balance Support: Right upper extremity supported;Left upper extremity supported;Feet supported Static Sitting - Level of Assistance: 2: Max assist Static Sitting - Comment/# of Minutes: can sit w/min at times, max w/fatigue, moves impulsively when seated at edge of bed, poor safety awareness Dynamic Sitting Balance Dynamic Sitting - Balance Support: Right upper extremity  supported;Left upper extremity supported;Feet supported Dynamic Sitting - Level of Assistance: 2: Max assist Dynamic Sitting Balance - Compensations: can sit w/min at times, max w/fatigue, moves impulsively when seated at edge of bed, poor safety awareness, will lean forward w/elbows on thights Dynamic Sitting - Balance Activities: Forward lean/weight shifting;Lateral lean/weight shifting Sitting balance - Comments: varies from min to max Static Standing Balance Static Standing - Balance Support: Right upper extremity supported;Left upper extremity supported;During functional activity Static Standing - Level of Assistance: 1: +2 Total assist Static Standing - Comment/# of Minutes: with eva walker and max of 2 for safety Dynamic Standing Balance Dynamic Standing - Balance Support: Right upper extremity supported;Left upper extremity supported;During functional activity Dynamic Standing - Level of Assistance: 2: Max assist Extremity Assessment  RUE Assessment RUE Assessment: Exceptions to [facility] Passive Range of Motion (PROM) Comments: wfl Active Range of Motion (AROM) Comments: limited by proximal weakness General Strength Comments: grossly 2/5 shoulder, at least 3/5 elbow and wrist LUE Assessment LUE Assessment: Exceptions to [facility] Passive Range of Motion (PROM) Comments: WFL Active Range of Motion (AROM) Comments: limited by proximal weakness General Strength Comments: grossly 2/5 shoulder, aprrox  3/5 elbow and wrist limited cooperation w testing but obvious significant deficits bilat RLE Assessment RLE Assessment: Exceptions to [facility] Passive Range of Motion (PROM) Comments: wfl Active Range of Motion (AROM) Comments: limited by weakness General Strength Comments: Hip grossly 2/5 moving in bed, unable to raise antigravity in any direction, quads 3/5, DF 3/5 LLE Assessment Passive Range of Motion (PROM) Comments: WFL Active Range of Motion (AROM) Comments: limited by proximal  weakness General Strength Comments: Hip grossly 2/5 moving in bed, unable to raise antigravity in any direction, quads 3/5, DF 3/5  Care Tool Care Tool Bed Mobility Roll left and right activity   Roll left and right assist level: 2 Helpers    Sit to lying activity   Sit to lying assist level: Maximal Assistance - Patient 25 - 49%    Lying to sitting edge of bed activity   Lying to sitting edge of bed assist level: 2 Helpers     Care Tool Transfers Sit to stand transfer   Sit to stand assist level: 2 Helpers    Chair/bed transfer   Chair/bed transfer assist level: 2 Armed forces training and education officer transfer activity did not occur: Safety/medical Personal assistant transfer activity did not occur:  Safety/medical concerns        Care Tool Locomotion Ambulation   Assist level: 2 helpers Assistive device: Ethelene Hal Max distance: 55f  Walk 10 feet activity Walk 10 feet activity did not occur: Safety/medical concerns       Walk 50 feet with 2 turns activity Walk 50 feet with 2 turns activity did not occur: Safety/medical concerns      Walk 150 feet activity Walk 150 feet activity did not occur: Safety/medical concerns      Walk 10 feet on uneven surfaces activity Walk 10 feet on uneven surfaces activity did not occur: Safety/medical concerns      Stairs Stair activity did not occur: Safety/medical concerns        Walk up/down 1 step activity Walk up/down 1 step or curb (drop down) activity did not occur: Safety/medical concerns     Walk up/down 4 steps activity did not occuR: Safety/medical concerns  Walk up/down 4 steps activity      Walk up/down 12 steps activity Walk up/down 12 steps activity did not occur: Safety/medical concerns      Pick up small objects from floor Pick up small object from the floor (from standing position) activity did not occur: Safety/medical concerns      Wheelchair Will patient use wheelchair at discharge?: Yes Type  of Wheelchair: Manual Wheelchair activity did not occur: Safety/medical concerns      Wheel 50 feet with 2 turns activity Wheelchair 50 feet with 2 turns activity did not occur: Safety/medical concerns    Wheel 150 feet activity Wheelchair 150 feet activity did not occur: Safety/medical concerns      Refer to Care Plan for Long Term Goals  SHORT TERM GOAL WEEK 1 PT Short Term Goal 1 (Week 1): Pt will perform supine to sit w/mod assist of 1 PT Short Term Goal 2 (Week 1): Pt will perform sit to stand w/mod assist of 2 consistently PT Short Term Goal 3 (Week 1): Pt will transfer bed to/from wc w/mod assist of 2 PT Short Term Goal 4 (Week 1): Pt will tolerate OOB in wc for at least 1 hr at a time  Recommendations for other services: Neuropsych  Skilled Therapeutic Intervention  Evaluation completed (see details above and below) with education on PT POC and goals and individual treatment initiated with focus on functional mobility/transfers, LE strength, dynamic standing balance/coordination, ambulation, and improved endurance with activity Pt and husband oriented to rehab unit.  Discussed process of daily scheduling and receiving schedule, purpose  And team conference schedule and D/c planning.  Pt provided w/ TIS wheelchair and 3in foam cushion and adjustments made to promote optimal seating posture and pressure distribution.  Husband instructed w/operation of brakes and assisted during session by providing wc follow.  Also sat w/pt at window during rest break per pt request.  Pt also provided w/ eva walker used during session and therapist adjusted to proper height for patient.  Discussed w/husband/nursing that pt would need to use mechanical lift for safety w/mobility at this time.  Pain:  Pt reports no pain initially, but at end of session c/o low back pain w/attemted Sit to stand for transfer to bed.  Treatment to tolerance.  Rest breaks and repositioning as needed.  TIS wc as above.  Pt  initially supine and agreeable to treatment session w/focus on completion of intial evaluation, transfers, sitting tolerance and comfort, initialtion of gait training, pt/family ed.  Pt required mod assist for threading UEs thru armholes of  gown due to visual perceptual vs cognitive vs motor planning issues. Pt supine to side to sit w/mod assist.  Sat at edge of bed several min w/min assist but moves impulsively, shifts in sitting, high risk of falls.  Later in session due to fatigue pt required max assist w/sitting. stand pivot transfer bed to wc w/max assist of 2, therapists blocking knees, max cues for initiation and sequencing and safety. Pt insistent on having husband w/her for session.  Becomes very agitated regarding this issue.  Husband attended entire session, assisted w/wc during gait assessmen Once in wc, pt transported to gym for continued session.  See eval for gait assessment.   wc legrests and headrest adjusted for improved comfort.   At end of session, pt transported to room by husband.  wc to bed max assist of 2 squat pivot, max assist for sitting balance, max assist +2 sit to supine, max assist to scoot in bed.   Pt left supine w/rails up x 4, alarm set, bed in lowest position, and needs in reach.  Husband at bedside.       Mobility Bed Mobility Bed Mobility: Rolling Right;Rolling Left;Right Sidelying to Sit;Left Sidelying to Sit;Scooting to [facility] Rolling Right: Moderate Assistance - Patient 50-74% Rolling Left: Moderate Assistance - Patient 50-74% Right Sidelying to Sit: Maximal Assistance - Patient 25-49% Left Sidelying to Sit: Maximal Assistance - Patient 25-49% Scooting to HOB: 2 Helpers Transfers Transfers: Sit to Stand;Stand to Sit Sit to Stand: 2 Helpers Stand to Sit: 2 Press photographer (Assistive device): Theatre stage manager Ambulation: Yes Gait Assistance: 2 Holiday representative (Feet): 5 Feet Assistive device: Network engineer Assistance Details:  max assist of 2 and very close wc follow Gait Gait: Yes Gait Pattern: Impaired (flexed trunk w/severe core instability, wide base, flexed posture, slides feet to advance, relies heavily on Eva walker, very close wc follow) Stairs / Additional Locomotion Stairs: No Wheelchair Mobility Wheelchair Mobility: No (Pt in TIS wc for safety, postural support)   Discharge Criteria: Patient will be discharged from PT if patient refuses treatment 3 consecutive times without medical reason, if treatment goals not met, if there is a change in medical status, if patient makes no progress towards goals or if patient is discharged from hospital.  The above assessment, treatment plan, treatment alternatives and goals were discussed and mutually agreed upon: by patient  Jerrilyn Cairo 06/28/2020, 4:57 PM

## 2020-06-28 NOTE — Progress Notes (Signed)
Patient bladder scanned for 304 ml this evening. This nurse attempted in/out cath but was unsuccessful. Staff used maxi lift transfer to bedside commode. Patient unable to void on commode. Patient transferred back to bed and second nurse attempted to in/out cath patient. Unsuccessful/. Patient refusing any more cath attempt at this time. Will report to oncoming shift.

## 2020-06-28 NOTE — Plan of Care (Signed)
  Problem: RH Eating Goal: LTG Patient will perform eating w/assist, cues/equip (OT) Description: LTG: Patient will perform eating with assist, with/without cues using equipment (OT) Flowsheets (Taken 06/28/2020 1239) LTG: Pt will perform eating with assistance level of: Minimal Assistance - Patient > 75%   Problem: RH Grooming Goal: LTG Patient will perform grooming w/assist,cues/equip (OT) Description: LTG: Patient will perform grooming with assist, with/without cues using equipment (OT) Flowsheets (Taken 06/28/2020 1239) LTG: Pt will perform grooming with assistance level of: Minimal Assistance - Patient > 75%   Problem: RH Bathing Goal: LTG Patient will bathe all body parts with assist levels (OT) Description: LTG: Patient will bathe all body parts with assist levels (OT) Flowsheets (Taken 06/28/2020 1239) LTG: Pt will perform bathing with assistance level/cueing: Minimal Assistance - Patient > 75%   Problem: RH Dressing Goal: LTG Patient will perform upper body dressing (OT) Description: LTG Patient will perform upper body dressing with assist, with/without cues (OT). Flowsheets (Taken 06/28/2020 1239) LTG: Pt will perform upper body dressing with assistance level of: Minimal Assistance - Patient > 75% Goal: LTG Patient will perform lower body dressing w/assist (OT) Description: LTG: Patient will perform lower body dressing with assist, with/without cues in positioning using equipment (OT) Flowsheets (Taken 06/28/2020 1239) LTG: Pt will perform lower body dressing with assistance level of: (excluding Teds) Minimal Assistance - Patient > 75%   Problem: RH Toileting Goal: LTG Patient will perform toileting task (3/3 steps) with assistance level (OT) Description: LTG: Patient will perform toileting task (3/3 steps) with assistance level (OT)  Flowsheets (Taken 06/28/2020 1239) LTG: Pt will perform toileting task (3/3 steps) with assistance level: Minimal Assistance - Patient > 75%   Problem:  RH Toilet Transfers Goal: LTG Patient will perform toilet transfers w/assist (OT) Description: LTG: Patient will perform toilet transfers with assist, with/without cues using equipment (OT) Flowsheets (Taken 06/28/2020 1239) LTG: Pt will perform toilet transfers with assistance level of: Minimal Assistance - Patient > 75%

## 2020-06-28 NOTE — Progress Notes (Signed)
Inpatient Rehabilitation  Patient information reviewed and entered into eRehab system by Harmonie Verrastro M. Marylin Lathon, M.A., CCC/SLP, PPS Coordinator.  Information including medical coding, functional ability and quality indicators will be reviewed and updated through discharge.    

## 2020-06-29 DIAGNOSIS — G061 Intraspinal abscess and granuloma: Secondary | ICD-10-CM | POA: Diagnosis not present

## 2020-06-29 DIAGNOSIS — B9689 Other specified bacterial agents as the cause of diseases classified elsewhere: Secondary | ICD-10-CM | POA: Diagnosis not present

## 2020-06-29 LAB — GLUCOSE, CAPILLARY
Glucose-Capillary: 84 mg/dL (ref 70–99)
Glucose-Capillary: 248 mg/dL — ABNORMAL HIGH (ref 70–99)
Glucose-Capillary: 187 mg/dL — ABNORMAL HIGH (ref 70–99)
Glucose-Capillary: 101 mg/dL — ABNORMAL HIGH (ref 70–99)

## 2020-06-29 MED ORDER — ACETAMINOPHEN 325 MG PO TABS
650.0000 mg | ORAL_TABLET | Freq: Four times a day (QID) | ORAL | Status: DC | PRN
  Administered 2020-06-29 – 2020-07-30 (×62): 650 mg via ORAL
  Filled 2020-06-29 (×70): qty 2

## 2020-06-29 MED ORDER — TAMSULOSIN HCL 0.4 MG PO CAPS
0.4000 mg | ORAL_CAPSULE | Freq: Every day | ORAL | Status: DC
  Administered 2020-06-29 – 2020-07-04 (×6): 0.4 mg via ORAL
  Filled 2020-06-29 (×6): qty 1

## 2020-06-29 NOTE — Plan of Care (Signed)
  Problem: RH BLADDER ELIMINATION Goal: RH STG MANAGE BLADDER WITH ASSISTANCE Description: STG Manage Bladder With min Assistance Outcome: Not Progressing

## 2020-06-29 NOTE — Progress Notes (Signed)
PROGRESS NOTE   Subjective/Complaints: No agitation today , no void x 8 h required cath x 1 yesterday , had 100% breakfast but ate little yesterday, slept well , no therapy today , was tired from PT, OT yesterday  ROS- occ back /neck pain, no SOB, N/V/D Objective:   No results found. No results for input(s): WBC, HGB, HCT, PLT in the last 72 hours. No results for input(s): NA, K, CL, CO2, GLUCOSE, BUN, CREATININE, CALCIUM in the last 72 hours.  Intake/Output Summary (Last 24 hours) at 06/29/2020 0908 Last data filed at 06/29/2020 0903 Gross per 24 hour  Intake 600 ml  Output 800 ml  Net -200 ml        Physical Exam: Vital Signs Blood pressure 133/74, pulse (!) 101, temperature (!) 97.4 F (36.3 C), resp. rate 18, height 6' (1.829 m), weight 99.7 kg, SpO2 96 %.  General: No acute distress Mood and affect are appropriate Heart: Regular rate and rhythm no rubs murmurs or extra sounds Lungs: Clear to auscultation, breathing unlabored, no rales or wheezes Abdomen: Positive bowel sounds, soft nontender to palpation, nondistended Extremities: No clubbing, cyanosis, or edema Skin: No evidence of breakdown, no evidence of rash thoracis incision with stutures intact Neurologic: Cranial nerves II through XII intact, motor strength is 4/5 in bilateral deltoid, bicep, tricep, grip, hip flexor, knee extensors, ankle dorsiflexor and plantar flexor Sensory exam normal sensation to light touch and proprioception in bilateral upper and lower extremities Cerebellar exam limb ataxia x 4 ext,    Musculoskeletal: No pain with ROM . No joint swelling Poor insight and judgement, + confabulation   Assessment/Plan: 1. Functional deficits which require 3+ hours per day of interdisciplinary therapy in a comprehensive inpatient rehab setting.  Physiatrist is providing close team supervision and 24 hour management of active medical problems listed  below.  Physiatrist and rehab team continue to assess barriers to discharge/monitor patient progress toward functional and medical goals  Care Tool:  Bathing  Bathing activity did not occur: Refused           Bathing assist       Upper Body Dressing/Undressing Upper body dressing Upper body dressing/undressing activity did not occur (including orthotics): Refused What is the patient wearing?: Pull over shirt    Upper body assist Assist Level: 2 Helpers    Lower Body Dressing/Undressing Lower body dressing    Lower body dressing activity did not occur: Refused What is the patient wearing?: Incontinence brief,Pants     Lower body assist Assist for lower body dressing: Maximal Assistance - Patient 25 - 49%     Toileting Toileting Toileting Activity did not occur (Clothing management and hygiene only): Refused  Toileting assist Assist for toileting: Dependent - Patient 0% (incontinent - total care to change)     Transfers Chair/bed transfer  Transfers assist     Chair/bed transfer assist level: 2 Helpers     Locomotion Ambulation   Ambulation assist      Assist level: 2 helpers Assistive device: Walker-Eva Max distance: 67ft   Walk 10 feet activity   Assist  Walk 10 feet activity did not occur: Safety/medical concerns  Walk 50 feet activity   Assist Walk 50 feet with 2 turns activity did not occur: Safety/medical concerns         Walk 150 feet activity   Assist Walk 150 feet activity did not occur: Safety/medical concerns         Walk 10 feet on uneven surface  activity   Assist Walk 10 feet on uneven surfaces activity did not occur: Safety/medical concerns         Wheelchair     Assist Will patient use wheelchair at discharge?: Yes Type of Wheelchair: Manual Wheelchair activity did not occur: Safety/medical concerns         Wheelchair 50 feet with 2 turns activity    Assist    Wheelchair 50 feet with  2 turns activity did not occur: Safety/medical concerns       Wheelchair 150 feet activity     Assist  Wheelchair 150 feet activity did not occur: Safety/medical concerns       Blood pressure 133/74, pulse (!) 101, temperature (!) 97.4 F (36.3 C), resp. rate 18, height 6' (1.829 m), weight 99.7 kg, SpO2 96 %.      Medical Problem List and Plan: 1.  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  Status post T3-4, T5-7, T9-10 laminectomy with evacuation of abscess with repeat lumbar paraspinal abscess aspiration by interventional radiology 06/19/2020.  Orders to remove sutures 07/01/2020.             -patient may shower but incision must be covered             -ELOS/Goals: MinA 3-4 weeks 2.  Antithrombotics: -DVT/anticoagulation: Continue Lovenox 52.5 mg daily.              -antiplatelet therapy: N/A 3. Pain Management: Oxycodone as needed 4. Mood: Melatonin 5 mg nightly             -antipsychotic agents: N/A 5. Neuropsych: This patient is not capable of making decisions on her own behalf. 6. Skin/Wound Care: Routine skin checks 7. Fluids/Electrolytes/Nutrition: Routine in and outs with follow-up chemistries 8.  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  Continue Rocephin through 07/23/2020 and follow-up infectious disease. 9.  Diabetes mellitus/DKA.  Hemoglobin A1c 8.0.  NovoLog 5 units 3 times daily, Lantus insulin 45 units daily.  Check blood sugars before meals and at bedtime CBG (last 3)  Recent Labs    06/28/20 1629 06/28/20 2111 06/29/20 0607  GLUCAP 301* 106* 84  some lability , cont to monitor prior to med changes   10.  CLL.  Follow-up outpatient Dr. Delight Hoh 11.  Hypothyroidism.  Synthroid 12.  Anemia of chronic disease.  Multifactorial.  Follow-up CBC 13.  Hypertension.  Dilacor120 mg daily,Micardis 80-25 mg daily            Vitals:   06/28/20 1921 06/29/20 0441  BP: 129/69 133/74  Pulse: (!) 101 (!) 101  Resp: 18 18  Temp: 98.2 F (36.8 C) (!) 97.4 F (36.3 C)  SpO2: 99% 96%  mild tachy BP ok 14.  Transaminitis.  Follow-up chemistries 15.  Obesity.  BMI 31.19.  Dietary follow-up 16.  History of asthma.  Continue Singulair 10 mg nightly as well as Symbicort 160-4.5 mcg.  Resume as needed 17.  History of genital herpes.  Conitnue Valtrex  LOS: 2 days A FACE TO FACE EVALUATION WAS PERFORMED  Charlett Blake 06/29/2020, 9:08 AM

## 2020-06-30 DIAGNOSIS — I10 Essential (primary) hypertension: Secondary | ICD-10-CM | POA: Diagnosis not present

## 2020-06-30 DIAGNOSIS — R7309 Other abnormal glucose: Secondary | ICD-10-CM | POA: Diagnosis not present

## 2020-06-30 DIAGNOSIS — G479 Sleep disorder, unspecified: Secondary | ICD-10-CM

## 2020-06-30 DIAGNOSIS — E1100 Type 2 diabetes mellitus with hyperosmolarity without nonketotic hyperglycemic-hyperosmolar coma (NKHHC): Secondary | ICD-10-CM

## 2020-06-30 DIAGNOSIS — G039 Meningitis, unspecified: Secondary | ICD-10-CM

## 2020-06-30 DIAGNOSIS — G9340 Encephalopathy, unspecified: Secondary | ICD-10-CM | POA: Diagnosis not present

## 2020-06-30 DIAGNOSIS — D638 Anemia in other chronic diseases classified elsewhere: Secondary | ICD-10-CM

## 2020-06-30 DIAGNOSIS — R7401 Elevation of levels of liver transaminase levels: Secondary | ICD-10-CM

## 2020-06-30 LAB — GLUCOSE, CAPILLARY
Glucose-Capillary: 99 mg/dL (ref 70–99)
Glucose-Capillary: 306 mg/dL — ABNORMAL HIGH (ref 70–99)
Glucose-Capillary: 129 mg/dL — ABNORMAL HIGH (ref 70–99)
Glucose-Capillary: 78 mg/dL (ref 70–99)

## 2020-06-30 LAB — CBC WITH DIFFERENTIAL/PLATELET
Abs Immature Granulocytes: 0.07 10*3/uL (ref 0.00–0.07)
Basophils Absolute: 0.1 10*3/uL (ref 0.0–0.1)
Basophils Relative: 1 %
Eosinophils Absolute: 0.1 10*3/uL (ref 0.0–0.5)
Eosinophils Relative: 1 %
HCT: 24.5 % — ABNORMAL LOW (ref 36.0–46.0)
Hemoglobin: 7.9 g/dL — ABNORMAL LOW (ref 12.0–15.0)
Immature Granulocytes: 1 %
Lymphocytes Relative: 20 %
Lymphs Abs: 1.5 10*3/uL (ref 0.7–4.0)
MCH: 26.2 pg (ref 26.0–34.0)
MCHC: 32.2 g/dL (ref 30.0–36.0)
MCV: 81.1 fL (ref 80.0–100.0)
Monocytes Absolute: 0.6 10*3/uL (ref 0.1–1.0)
Monocytes Relative: 8 %
Neutro Abs: 5.6 10*3/uL (ref 1.7–7.7)
Neutrophils Relative %: 69 %
Platelets: 454 10*3/uL — ABNORMAL HIGH (ref 150–400)
RBC: 3.02 MIL/uL — ABNORMAL LOW (ref 3.87–5.11)
RDW: 16.5 % — ABNORMAL HIGH (ref 11.5–15.5)
WBC: 7.9 10*3/uL (ref 4.0–10.5)
nRBC: 0 % (ref 0.0–0.2)

## 2020-06-30 LAB — COMPREHENSIVE METABOLIC PANEL
ALT: 57 U/L — ABNORMAL HIGH (ref 0–44)
AST: 47 U/L — ABNORMAL HIGH (ref 15–41)
Albumin: 2.1 g/dL — ABNORMAL LOW (ref 3.5–5.0)
Alkaline Phosphatase: 197 U/L — ABNORMAL HIGH (ref 38–126)
Anion gap: 12 (ref 5–15)
BUN: 11 mg/dL (ref 6–20)
CO2: 19 mmol/L — ABNORMAL LOW (ref 22–32)
Calcium: 8.3 mg/dL — ABNORMAL LOW (ref 8.9–10.3)
Chloride: 108 mmol/L (ref 98–111)
Creatinine, Ser: 0.71 mg/dL (ref 0.44–1.00)
GFR, Estimated: >60 mL/min (ref >60)
Glucose, Bld: 89 mg/dL (ref 70–99)
Potassium: 4.2 mmol/L (ref 3.5–5.1)
Sodium: 139 mmol/L (ref 135–145)
Total Bilirubin: 0.9 mg/dL (ref 0.3–1.2)
Total Protein: 5.5 g/dL — ABNORMAL LOW (ref 6.5–8.1)

## 2020-06-30 MED ORDER — MONTELUKAST SODIUM 10 MG PO TABS
10.0000 mg | ORAL_TABLET | Freq: Every day | ORAL | Status: DC
  Administered 2020-06-30 – 2020-07-30 (×31): 10 mg via ORAL
  Filled 2020-06-30 (×32): qty 1

## 2020-06-30 NOTE — IPOC Note (Signed)
Individualized overall Plan of Care [facility]) Patient Details Name: Tonya Myers MRN: [id number] DOB: 1965/10/13  Admitting Diagnosis: Acute encephalopathy  Hospital Problems: Principal Problem:   Acute encephalopathy Active Problems:   CLL (chronic lymphocytic leukemia) ([place])   Acute hemorrhoid   DDD (degenerative disc disease), cervical   Cervical disc disorder with radiculopathy of cervical region   Cervical facet syndrome   DDD (degenerative disc disease), lumbar   Facet syndrome, lumbar   Bilateral occipital neuralgia   Sacroiliac joint dysfunction   DJD of shoulder   Hypothyroidism   Arthritis   Carpal tunnel syndrome, bilateral   Chronic pain in right shoulder   Controlled type 2 diabetes mellitus without complication, without long-term current use of insulin (HCC)   Essential hypertension   Herpes simplex vulvovaginitis   Pneumococcal meningitis   Bacterial spinal epidural abscess   Meningitis   Labile blood glucose   Uncontrolled type 2 diabetes mellitus with hyperosmolar nonketotic hyperglycemia (HCC)   Transaminitis   Sleep disturbance   Anemia of chronic disease     Functional Problem List: Nursing Bladder,Bowel,Behavior,Safety,Endurance,Medication Management,Motor,Skin Integrity  PT Balance,Safety,Behavior,Endurance,Motor,Pain,Perception,Other (comment) (cognition,)  OT Behavior,Safety  SLP Cognition  TR         Basic ADL's: OT Eating,Grooming,Bathing,Dressing,Toileting     Advanced  ADL's: OT Simple Meal Preparation     Transfers: PT Bed Mobility,Bed to Chair,Car,Furniture  OT Toilet,Tub/Shower     Locomotion: PT Ambulation,Wheelchair Mobility,Stairs     Additional Impairments: OT Other (comment) (not thoroughly assessed)  SLP Communication,Social Cognition expression Memory,Problem Solving,Awareness,Attention  TR      Anticipated Outcomes Item Anticipated Outcome  Self Feeding Min A  Swallowing      Basic self-care  Min A   Toileting  Min A   Bathroom Transfers Min A  Bowel/Bladder  To be continent of bowel/bladder and also to have regular bowel/bladder function with Mod i  Transfers  min assist  Locomotion  min assist  Communication  Mod I  Cognition  mod I, Min A  Pain  <2  Safety/Judgment  Able to call for help and express needs   Therapy Plan: PT Intensity: Minimum of 1-2 x/day ,45 to 90 minutes PT Frequency: 5 out of 7 days PT Duration Estimated Length of Stay: 3.5 weeks OT Intensity: Minimum of 1-2 x/day, 45 to 90 minutes OT Frequency: 5 out of 7 days OT Duration/Estimated Length of Stay: 3.5-4 weeks SLP Intensity: Minumum of 1-2 x/day, 30 to 90 minutes SLP Frequency: 3 to 5 out of 7 days SLP Duration/Estimated Length of Stay: 3 weeks    Team Interventions: Nursing Interventions Patient/Family Education,Pain Management,Bladder Management,Medication Management,Discharge Planning,Bowel Management,Skin Care/Wound Management  PT interventions Ambulation/gait training,Community Corporate treasurer re-education,Psychosocial support,Stair training,UE/LE Strength taining/ROM,Wheelchair propulsion/positioning,Balance/vestibular training,Discharge planning,Pain management,Therapeutic Activities,UE/LE Coordination activities,Cognitive remediation/compensation,Functional mobility training,Patient/family education,Therapeutic Exercise,Visual/perceptual remediation/compensation  OT Interventions Balance/vestibular training,Discharge planning,Pain management,Self Care/advanced ADL retraining,Therapeutic Activities,UE/LE Coordination activities,Therapeutic Exercise,Patient/family education,Functional mobility training,Disease mangement/prevention,Cognitive remediation/compensation,Community reintegration,DME/adaptive equipment instruction,Psychosocial support,UE/LE Strength taining/ROM  SLP Interventions Cognitive remediation/compensation,Cueing hierarchy,Functional  tasks,Medication managment,Internal/external aids,Patient/family education,Speech/Language facilitation  TR Interventions    SW/CM Interventions Discharge Planning,Psychosocial Support,Patient/Family Education   Barriers to Discharge MD  Medical stability, Weight and Behavior  Nursing      PT Inaccessible home environment,Weight,Behavior Pt is naturaly large in stature/17ft 1in, confusion and agitation limit participation overall w/other therapiies today,  OT Incontinence,Insurance for SNF coverage,Behavior anticipate that pts biggest barrier will be her willingness to comply with interprofessional POC  SLP      SW  Team Discharge Planning: Destination: PT-Home ,OT- Home (or SNF if pt cannot provide Min A + 24/7 support) , SLP-Home Projected Follow-up: PT-Home health PT, OT-  24 hour supervision/assistance,Home health OT, SLP-Home Health SLP,Outpatient SLP,24 hour supervision/assistance Projected Equipment Needs: PT-To be determined, OT- To be determined, SLP-None recommended by SLP Equipment Details: PT- , OT-  Patient/family involved in discharge planning: PT- Patient,Family member/caregiver,  OT-Patient,Family member/caregiver, SLP-Patient,Family member/caregiver  MD ELOS: 15-18 days. Medical Rehab Prognosis:  Good Assessment: 55 year old right-handed female with history of diabetes mellitus, CLL, chronic back pain, hypertension.  Presented on 06/09/2020 with altered mental status and aphasia.  Patient had evidently presented earlier that day and left without being seen after having labs done.  She returned to the ED was unable to fully express why she was there with altered mental status noted.  Complaints of neck pain.  Cranial CT scan negative.  MRI normal brain.  EEG negative for seizure.  Chest x-ray no active disease.  Echocardiogram with ejection fraction of 50 to 55% no wall motion abnormalities.  Admission chemistries unremarkable aside glucose 274 creatinine 1.35, WBC 15,500  hemoglobin 10.1, troponin negative, hemoglobin A1c 8.1.  TSH 5.742, alkaline phosphatase 200 total bilirubin 2.1 direct bilirubin 1.2 SARS coronavirus negative, ammonia negative.  Patient subsequently spiked a fever as well as maintain on insulin therapy suspect DKA presentation.  She was placed on broad-spectrum antibiotics for fever and concern of meningitis   She underwent LP.  CSF examination showed gram-positive diplococci.  MRI cervical lumbar thoracic spine showed findings compatible lumbar spinal infection.  There appeared to be septic arthritis in the left L2-3 and 3-4 facets with adjacent paraspinous abscesses.  Ventral epidural abscess at L3-4 small but appears mildly progressive.  She was placed on broad-spectrum antibiotics for suspected meningitis.  Patient underwent T3-4 laminectomy, T5-7 laminectomy, 9-10 laminectomy evacuation of abscess 06/17/2020 per Dr. Lysle Morales with repeat lumbar paraspinal abscess aspiration by interventional radiology 06/19/2020.  Recommendations to continue ceftriaxone total of 6 weeks through 07/23/2020. Close monitoring of liver functions as they remained elevated but stable. Patient with resulting functional deficits with mobility, transfers, cognition, self-care.  Will set goals for Min A with PT/OT and Mod I with SLP.   Due to the current state of emergency, patients may not be receiving their 3-hours of Medicare-mandated therapy.  See Team Conference Notes for weekly updates to the plan of care

## 2020-06-30 NOTE — Evaluation (Signed)
Speech Language Pathology Assessment and Plan  Patient Details  Name: Tonya Myers MRN: [id number] Date of Birth: 1965/08/29  SLP Diagnosis: Cognitive Impairments;Speech and Language deficits  Rehab Potential: Good ELOS: 3 weeks    Today's Date: 06/30/2020 SLP Individual Time: 1100-1159 SLP Individual Time Calculation (min): 50 min   Hospital Problem: Active Problems:   CLL (chronic lymphocytic leukemia) (HCC)   Acute hemorrhoid   DDD (degenerative disc disease), cervical   Cervical disc disorder with radiculopathy of cervical region   Cervical facet syndrome   DDD (degenerative disc disease), lumbar   Facet syndrome, lumbar   Bilateral occipital neuralgia   Sacroiliac joint dysfunction   DJD of shoulder   Hypothyroidism   Arthritis   Carpal tunnel syndrome, bilateral   Chronic pain in right shoulder   Controlled type 2 diabetes mellitus without complication, without long-term current use of insulin (HCC)   Essential hypertension   Herpes simplex vulvovaginitis   Pneumococcal meningitis   Bacterial spinal epidural abscess   Meningitis   Labile blood glucose   Uncontrolled type 2 diabetes mellitus with hyperosmolar nonketotic hyperglycemia (HCC)   Transaminitis   Sleep disturbance   Anemia of chronic disease  Past Medical History:  Past Medical History:  Diagnosis Date  . Acute hemorrhoid 03/11/2015  . Anxiety   . Arthritis   . Asthma   . Cancer ([place])   . Chronic back pain   . CLL (chronic lymphocytic leukemia) ([place])   . Depression   . Diabetes mellitus without complication ([place])   . Genital herpes    type 2  . Hypertension   . Hypothyroidism   . Vertigo    Past Surgical History:  Past Surgical History:  Procedure Laterality Date  . ABDOMINAL HYSTERECTOMY    . BILATERAL SALPINGOOPHORECTOMY  2009  . BREAST BIOPSY Right 05/17/2016   FIBROADENOMATOUS CHANGE AND SCLEROSING ADENOSIS WITH  . COLONOSCOPY WITH PROPOFOL N/A 06/16/2015   Procedure: COLONOSCOPY  WITH PROPOFOL;  Surgeon: [name], MD;  Location: [facility] ENDOSCOPY;  Service: Endoscopy;  Laterality: N/A;  . LAPAROSCOPIC SUPRACERVICAL HYSTERECTOMY  2009   due to [place]  . OOPHORECTOMY    . THORACIC LAMINECTOMY FOR EPIDURAL ABSCESS Bilateral 06/17/2020   Procedure: THORACIC LAMINECTOMY FOR EPIDURAL ABSCESS;  Surgeon: Deetta Perla, MD;  Location: [facility] ORS;  Service: Neurosurgery;  Laterality: Bilateral;    Assessment / Plan / Recommendation Clinical Impression Tonya Myers is a 55 year old right-handed female with history of diabetes mellitus, CLL, chronic back pain, hypertension.  Per chart review patient lives with spouse.  Two-level home bed and bath main level.  Independent prior to admission.  Presented 06/09/2020 with altered mental status and aphasia.  Patient had evidently presented earlier that day and left without being seen after having labs done.  She returned to the ED was unable to fully express why she was there with altered mental status noted.  Complaints of neck pain.  Cranial CT scan negative.  MRI normal brain.  EEG negative for seizure.  Chest x-ray no active disease.  Echocardiogram with ejection fraction of 50 to 55% no wall motion abnormalities.  Admission chemistries unremarkable aside glucose 274 creatinine 1.35, WBC 15,500 hemoglobin 10.1, troponin negative, hemoglobin A1c 8.1.  TSH 5.742, alkaline phosphatase 200 total bilirubin 2.1 direct bilirubin 1.2 SARS coronavirus negative, ammonia negative.  Patient subsequently spiked a fever as well as maintain on insulin therapy suspect DKA presentation.  She was placed on broad-spectrum antibiotics for fever and concern of meningitis  She underwent LP.  CSF examination showed gram-positive diplococci.  MRI cervical lumbar thoracic spine showed findings compatible lumbar spinal infection.  There appeared to be septic arthritis in the left L2-3 and 3-4 facets with adjacent paraspinous abscesses.  Ventral epidural abscess at  L3-4 small but appears mildly progressive.  She was placed on broad-spectrum antibiotics for suspected meningitis.  Patient underwent T3-4 laminectomy, T5-7 laminectomy, 9-10 laminectomy evacuation of abscess 06/17/2020 per Dr. Lysle Morales with repeat lumbar paraspinal abscess aspiration by interventional radiology 06/19/2020.  Recommendations to continue ceftriaxone total of 6 weeks through 07/23/2020.Marland Kitchen  Patient was cleared to begin Lovenox for DVT prophylaxis.  Close monitoring of liver functions as they remained elevated but stable.  Due to patient's overall decrease in functional ability and need for physical assistance she was admitted for a comprehensive rehab program.  Pt presents with mild-moderate cognitive linguistic impairments, deficits include mildly complex problem solving, error awareness, selective attention, higher level word finding and severe deficit in short term recall. Given formal cognitive linguistic assessment utilizing Cognistat, pt scored severe deficit in memory, moderate deficits in calculation, mild deficits in construction task and remainder subsections WFL. Pt denies no acute changes in speech nor swallowing. Pt consumed thin liquid in consecutive sips via straw with no overt s/s aspiration and refused solid trial. Pt would benefit from skilled ST services in order to maximize functional independence and reduce burden of care, likely requiring supervision at discharge with continued skilled ST services for cognition    Skilled Therapeutic Interventions          Skilled ST services focused on language skills. SLP facilitated administration of cognitive linguistic formal assessment and provided education of results. SLP and pt collaborated to set goals for cognitive linguistic needs during length of stay. SLP facilitated divergent naming in which pt named 10 animals in 1 minute (n=> 15-18). SLP provided mod A fading to min A semantic cues for word finding in conversation. All  questions answered to satisfaction.  Pt was left in room with call bell within reach and chair alarm set. SLP recommends to continue skilled services.  SLP Assessment  Patient will need skilled Speech Lanaguage Pathology Services during [name] admission    Recommendations  SLP Diet Recommendations: Age appropriate regular solids;Thin Liquid Administration via: Cup;Straw Medication Administration: Whole meds with puree Supervision: Staff to assist with self feeding;Intermittent supervision to cue for compensatory strategies Compensations: Minimize environmental distractions;Slow rate;Small sips/bites;Lingual sweep for clearance of pocketing;Follow solids with liquid Postural Changes and/or Swallow Maneuvers: Seated upright 90 degrees Oral Care Recommendations: Oral care BID Patient destination: Home Follow up Recommendations: Home Health SLP;Outpatient SLP;24 hour supervision/assistance Equipment Recommended: None recommended by SLP    SLP Frequency 3 to 5 out of 7 days   SLP Duration  SLP Intensity  SLP Treatment/Interventions 3 weeks  Minumum of 1-2 x/day, 30 to 90 minutes  Cognitive remediation/compensation;Cueing hierarchy;Functional tasks;Medication managment;Internal/external aids;Patient/family education;Speech/Language facilitation    Pain Pain Assessment Faces Pain Scale: Hurts little more Pain Type: Surgical pain Pain Location: Back Pain Intervention(s): RN made aware  Prior Functioning Cognitive/Linguistic Baseline: Within functional limits Type of Home: House  Lives With: Significant other Available Help at Discharge: Family;Available 24 hours/day Vocation: Retired  Programmer, systems Overall Cognitive Status: Impaired/Different from baseline Arousal/Alertness: Awake/alert Orientation Level: Oriented to person;Disoriented to time;Oriented to place Attention: Selective;Sustained Sustained Attention: Appears intact Selective Attention: Impaired Selective  Attention Impairment: Verbal basic;Functional basic Memory: Impaired Memory Impairment: Storage deficit;Retrieval deficit;Decreased recall of new information Awareness:  Impaired Awareness Impairment: Emergent impairment Problem Solving: Impaired Problem Solving Impairment: Verbal complex;Functional complex Safety/Judgment: Appears intact  Comprehension Auditory Comprehension Overall Auditory Comprehension: Impaired Yes/No Questions: Within Functional Limits Commands: Impaired One Step Basic Commands: 75-100% accurate Two Step Basic Commands: 75-100% accurate Conversation: Complex Interfering Components: Attention;Working Curator: Within [employer] Reading Comprehension Reading Status: Within funtional limits Expression Expression Primary Mode of Expression: Verbal Verbal Expression Overall Verbal Expression: Impaired Initiation: No impairment Level of Generative/Spontaneous Verbalization: Conversation Repetition: No impairment Naming: Impairment Confrontation: Within functional limits Divergent: 50-74% accurate Verbal Errors: Semantic paraphasias;Not aware of errors;Aware of errors Pragmatics: No impairment Interfering Components: Attention Written Expression Dominant Hand: Right Written Expression: Not tested Oral Motor Oral Motor/Sensory Function Overall Oral Motor/Sensory Function: Within functional limits  Care Tool Care Tool Cognition Expression of Ideas and Wants Expression of Ideas and Wants: Some difficulty - exhibits some difficulty with expressing needs and ideas (e.g, some words or finishing thoughts) or speech is not clear   Understanding Verbal and Non-Verbal Content Understanding Verbal and Non-Verbal Content: Usually understands - understands most conversations, but misses some part/intent of message. Requires cues at times to understand   Memory/Recall Ability *first 3 days only      Short Term  Goals: Week 1: SLP Short Term Goal 1 (Week 1): Pt will demonstrate selective attention in 15 minute intervals with min A verbal cues for redirection. SLP Short Term Goal 2 (Week 1): Pt will demonstrate mildly complex problem solving skills with supervision A verbal cues. SLP Short Term Goal 3 (Week 1): Pt will self-monitor and self-correct functional and verbal errors with supervision A verbal cues. SLP Short Term Goal 4 (Week 1): Pt will demonstrate recall of daily and novel information with aids given max A verbal cues. SLP Short Term Goal 5 (Week 1): Pt will demonstrate higher level word finding skills in structured and unstructured tasks with min A semantic cues.  Refer to Care Plan for Long Term Goals  Recommendations for other services: None   Discharge Criteria: Patient will be discharged from SLP if patient refuses treatment 3 consecutive times without medical reason, if treatment goals not met, if there is a change in medical status, if patient makes no progress towards goals or if patient is discharged from hospital.  The above assessment, treatment plan, treatment alternatives and goals were discussed and mutually agreed upon: by patient and by family  Glennys Schorsch  [facility] 06/30/2020, 1:03 PM

## 2020-06-30 NOTE — Progress Notes (Addendum)
PROGRESS NOTE   Subjective/Complaints: Patient seen sitting up in bed this morning.  She states she slept well overnight.  No significant issues reported overnight.  She is slowed, but has questions regarding therapies today, family participation, plans for discharge.  ROS: Denies CP, shortness of breath, nausea, vomiting, diarrhea.  Objective:   No results found. Recent Labs    06/30/20 0832  WBC 7.9  HGB 7.9*  HCT 24.5*  PLT 454*   Recent Labs    06/30/20 0503  NA 139  K 4.2  CL 108  CO2 19*  GLUCOSE 89  BUN 11  CREATININE 0.71  CALCIUM 8.3*    Intake/Output Summary (Last 24 hours) at 06/30/2020 1224 Last data filed at 06/30/2020 0500 Gross per 24 hour  Intake --  Output 780 ml  Net -780 ml        Physical Exam: Vital Signs Blood pressure 126/76, pulse 91, temperature 98.7 F (37.1 C), temperature source Oral, resp. rate 16, height 6' (1.829 m), weight 99.7 kg, SpO2 96 %. Constitutional: No distress . Vital signs reviewed. HENT: Normocephalic.  Atraumatic. Eyes: EOMI. No discharge. Cardiovascular: No JVD.  RRR. Respiratory: Normal effort.  No stridor.  Bilateral clear to auscultation. GI: Non-distended.  BS +. Skin: Warm and dry.  Intact. Psych: Flat.  Slowed. Musc: No edema in extremities.  No tenderness in extremities. Neurologic: Alert and oriented x2 Motor: 4/5 throughout   Assessment/Plan: 1. Functional deficits which require 3+ hours per day of interdisciplinary therapy in a comprehensive inpatient rehab setting.  Physiatrist is providing close team supervision and 24 hour management of active medical problems listed below.  Physiatrist and rehab team continue to assess barriers to discharge/monitor patient progress toward functional and medical goals  Care Tool:  Bathing  Bathing activity did not occur: Refused           Bathing assist       Upper Body Dressing/Undressing Upper  body dressing Upper body dressing/undressing activity did not occur (including orthotics): Refused What is the patient wearing?: Pull over shirt    Upper body assist Assist Level: Total Assistance - Patient < 25%    Lower Body Dressing/Undressing Lower body dressing    Lower body dressing activity did not occur: Refused What is the patient wearing?: Incontinence brief,Pants     Lower body assist Assist for lower body dressing: Total Assistance - Patient < 25%     Toileting Toileting Toileting Activity did not occur Landscape architect and hygiene only): Refused  Toileting assist Assist for toileting: Dependent - Patient 0%     Transfers Chair/bed transfer  Transfers assist     Chair/bed transfer assist level: 2 Helpers     Locomotion Ambulation   Ambulation assist      Assist level: 2 helpers Assistive device: [place] Max distance: 46ft   Walk 10 feet activity   Assist  Walk 10 feet activity did not occur: Safety/medical concerns        Walk 50 feet activity   Assist Walk 50 feet with 2 turns activity did not occur: Safety/medical concerns         Walk 150 feet activity   Assist  Walk 150 feet activity did not occur: Safety/medical concerns         Walk 10 feet on uneven surface  activity   Assist Walk 10 feet on uneven surfaces activity did not occur: Safety/medical concerns         Wheelchair     Assist Will patient use wheelchair at discharge?: Yes Type of Wheelchair: Manual Wheelchair activity did not occur: Safety/medical concerns         Wheelchair 50 feet with 2 turns activity    Assist    Wheelchair 50 feet with 2 turns activity did not occur: Safety/medical concerns       Wheelchair 150 feet activity     Assist  Wheelchair 150 feet activity did not occur: Safety/medical concerns       Medical Problem List and Plan: 1.  Decreased functional ability secondary to acute encephalopathy.  She had  pneumococcal meningitis/paraspinal abscess and epidural abscess as well as encephalitis and is status post T3-4, T5-7, T9-10 laminectomy with evacuation of abscess with repeat lumbar paraspinal abscess aspiration by interventional radiology 06/19/2020.    Continue [name]   Plan to DC sutures tomorrow 2.  Antithrombotics: -DVT/anticoagulation: Continue Lovenox 52.5 mg daily.              -antiplatelet therapy: N/A 3. Pain Management: Oxycodone as needed  Appears controlled on 2/7 4. Mood: Team support             -antipsychotic agents: N/A 5. Neuropsych: This patient is not capable of making decisions on her own behalf. 6. Skin/Wound Care: Routine skin checks 7. Fluids/Electrolytes/Nutrition: Routine in and outs 8.  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  Continue IV Rocephin through 07/23/2020 and follow-up infectious disease. 9.  Diabetes mellitus with hyperglycemia/DKA.  Hemoglobin A1c 8.0.  NovoLog 5 units 3 times daily, Lantus insulin 45 units daily.  Check blood sugars before meals and at bedtime CBG (last 3)  Recent Labs    06/29/20 2141 06/30/20 0551 06/30/20 1207  GLUCAP 101* 99 306*   Very labile on 3/7, Monitor for trend 10.  CLL.  Follow-up outpatient Dr. Delight Hoh 11.  Hypothyroidism.  Synthroid 12.  Anemia of chronic disease.  Multifactorial.   Hemoglobin 7.9 on 3/7  Continue to monitor 13.  Hypertension.  Dilacor120 mg daily,Micardis 80-25 mg daily           Vitals:   06/29/20 1927 06/30/20 0357  BP: 124/67 126/76  Pulse: 95 91  Resp:  16  Temp: 98.9 F (37.2 C) 98.7 F (37.1 C)  SpO2: 97% 96%   Controlled on 3/7 14.  Transaminitis.    LFTs elevated on 3/7, continue to monitor 15.  Obesity.  BMI 31.19.  Dietary follow-up 16.  History of asthma.  Continue Singulair 10 mg nightly.   17.  History of genital herpes.  Conitnue Valtrex 18.  Sleep disturbance  Melatonin   LOS: 3 days A FACE TO FACE EVALUATION WAS  PERFORMED  Sterling Mondo Lorie Phenix 06/30/2020, 12:24 PM

## 2020-06-30 NOTE — Progress Notes (Signed)
[place] Individual Statement of Services  Patient Name:  Tonya Myers  Date:  06/30/2020  Welcome to the [facility].  Our goal is to provide you with an individualized program based on your diagnosis and situation, designed to meet your specific needs.  With this comprehensive rehabilitation program, you will be expected to participate in at least 3 hours of rehabilitation therapies Monday-Friday, with modified therapy programming on the weekends.  Your rehabilitation program will include the following services:  Physical Therapy (PT), Occupational Therapy (OT), Speech Therapy (ST), 24 hour per day rehabilitation nursing, Neuropsychology, Care Coordinator, Rehabilitation Medicine, Nutrition Services and Pharmacy Services  Weekly team conferences will be held on wednesday to discuss your progress.  Your Inpatient Rehabilitation Care Coordinator will talk with you frequently to get your input and to update you on team discussions.  Team conferences with you and your family in attendance may also be held.  Expected length of stay: 3.5-4 weeks  Overall anticipated outcome: overall min assist level  Depending on your progress and recovery, your program may change. Your Inpatient Rehabilitation Care Coordinator will coordinate services and will keep you informed of any changes. Your Inpatient Rehabilitation Care Coordinator's name and contact numbers are listed  below.  The following services may also be recommended but are not provided by the [place] will be made to provide these services after discharge if needed.  Arrangements include referral to agencies that provide these services.  Your insurance has been verified to be:  Medicare & Aetna Your primary doctor is:  [name]  Pertinent information will be  shared with your doctor and your insurance company.  Inpatient Rehabilitation Care Coordinator:  Ovidio Kin, [place] or Emilia Beck  Information discussed with and copy given to patient by: Elease Hashimoto, 06/30/2020, 9:52 AM

## 2020-06-30 NOTE — Progress Notes (Signed)
Pt. IV dislodged due to pt's irritability. Initiated IV consult team.

## 2020-06-30 NOTE — Progress Notes (Signed)
Occupational Therapy Session Note  Patient Details  Name: Tonya Myers MRN: [id number] Date of Birth: August 07, 1965  Today's Date: 06/30/2020 OT Individual Time: 2836-6294 OT Individual Time Calculation (min): 57 min    Short Term Goals: Week 1:  OT Short Term Goal 1 (Week 1): Pt will maintain sitting balance EOB with Mod A for ~5 minutes during self care activity OT Short Term Goal 2 (Week 1): Pt will complete 1 grooming task w/c level at the sink with no more than Mod A and mod cues OT Short Term Goal 3 (Week 1): Pt will remain OOB for ~45 minutes while engaging in ADL activity  Skilled Therapeutic Interventions/Progress Updates:    Pt greeted at time of session supine in bed with husband present, who remained throughout session. Pt agreeable to OT session. Wanting to change shirt, supine > sit Max A with log roll, education on back precautions. Doffed/donned new shirt Max A-total, noted to have flexed posture throughout and tried to have pt have upright posture but unable. C/o lightheadedness, returned to supine Max A. BP checked and 107/64 w/ symptoms resolved. Supine > sit again once symptoms resolved and 90/58 w/ HR in 120s. Quickly returned to supine Max A and symptoms improved. Switched to bed level there ex w/ 1# dowel for bicep curls, chest press, overhead raise, FWD, and backward circles initially able to perform in reps of 7-10 but decreasing to 5 w/ fatigue. Therapist facilitation for BUE coordination d/t decreased [place] in [place]. Attempted BLE ROM as well at bed level for SAQs 1x10 each and foot pumps same manner. Pt requiring multiple rest breaks throughout session, frequent redirecting, cues for sequencing and problem solving. Reaching for small items like cup of water with gross grasp, noted decreased coordination as well. Pt in bed with alarm on call bell in reach, husband present.   Therapy Documentation Precautions:  Precautions Precautions: Fall,Back Restrictions Weight Bearing  Restrictions: No Other Position/Activity Restrictions: soinal     Therapy/Group: Individual Therapy  Viona Gilmore 06/30/2020, 7:30 AM

## 2020-06-30 NOTE — Progress Notes (Signed)
Inpatient Rehabilitation Care Coordinator Assessment and Plan Patient Details  Name: Tonya Myers MRN: [id number] Date of Birth: 30-Jun-1965  Today's Date: 06/30/2020  Hospital Problems: Active Problems:   CLL (chronic lymphocytic leukemia) ([place])   Acute hemorrhoid   DDD (degenerative disc disease), cervical   Cervical disc disorder with radiculopathy of cervical region   Cervical facet syndrome   DDD (degenerative disc disease), lumbar   Facet syndrome, lumbar   Bilateral occipital neuralgia   Sacroiliac joint dysfunction   DJD of shoulder   Hypothyroidism   Arthritis   Carpal tunnel syndrome, bilateral   Chronic pain in right shoulder   Controlled type 2 diabetes mellitus without complication, without long-term current use of insulin ([place])   Essential hypertension   Herpes simplex vulvovaginitis   Pneumococcal meningitis   Bacterial spinal epidural abscess  Past Medical History:  Past Medical History:  Diagnosis Date  . Acute hemorrhoid 03/11/2015  . Anxiety   . Arthritis   . Asthma   . Cancer ([place])   . Chronic back pain   . CLL (chronic lymphocytic leukemia) ([place])   . Depression   . Diabetes mellitus without complication ([place])   . Genital herpes    type 2  . Hypertension   . Hypothyroidism   . Vertigo    Past Surgical History:  Past Surgical History:  Procedure Laterality Date  . ABDOMINAL HYSTERECTOMY    . BILATERAL SALPINGOOPHORECTOMY  2009  . BREAST BIOPSY Right 05/17/2016   FIBROADENOMATOUS CHANGE AND SCLEROSING ADENOSIS WITH  . COLONOSCOPY WITH PROPOFOL N/A 06/16/2015   Procedure: COLONOSCOPY WITH PROPOFOL;  Surgeon: [name], MD;  Location: [facility] ENDOSCOPY;  Service: Endoscopy;  Laterality: N/A;  . LAPAROSCOPIC SUPRACERVICAL HYSTERECTOMY  2009   due to [place]  . OOPHORECTOMY    . THORACIC LAMINECTOMY FOR EPIDURAL ABSCESS Bilateral 06/17/2020   Procedure: THORACIC LAMINECTOMY FOR EPIDURAL ABSCESS;  Surgeon: Deetta Perla, MD;  Location: [facility]  ORS;  Service: Neurosurgery;  Laterality: Bilateral;   Social History:  reports that she has never smoked. She has never used smokeless tobacco. She reports current alcohol use of about 1.0 standard drink of alcohol per week. She reports that she does not use drugs.  Family / Support Systems Marital Status: Married Patient Roles: Spouse,Parent Spouse/Significant Other: [name] [phone] Children: Three grwon children-Valenitino-daughter [phone] Other Supports: [name]-aunt [phone] Anticipated Caregiver: [name] Ability/Limitations of Caregiver: none-retired Caregiver Availability: 24/7 Family Dynamics: Close knot family all three children are out of state-[place] and [place]  Social History Preferred language: English Religion: Non-Denominational Cultural Background: No issues Education: HS Read: Yes Write: Yes Employment Status: Retired Public relations account executive Issues: No issues Guardian/Conservator: None-according to MD pt is capable of making her own decisions while here. Husband to be here daily and privide support   Abuse/Neglect Abuse/Neglect Assessment Can Be Completed: Yes Physical Abuse: Denies Verbal Abuse: Denies Sexual Abuse: Denies Exploitation of patient/patient's resources: Denies Self-Neglect: Denies  Emotional Status Pt's affect, behavior and adjustment status: Pt is somewhat cranky and not sure she wants to be here. Her husband is supportive and caters to pt. Pt has been independent up until this illness and she is somewhat angry about it. She wants to get better and feels she has long way to go. Will ask neuro-psych to see while here for coping Recent Psychosocial Issues: other health issues-leukemia and chronic back pain Psychiatric History: History of anxiety takes medications for this and feels she may need more now with this to deal with. Will ask  neuro-psych to see and follow while here Substance Abuse History: No  issues  Patient / Family Perceptions, Expectations & Goals Pt/Family understanding of illness & functional limitations: Pt and husband can explain her condition and hope it will go up from here and she will progress. Both talk with the MD and feel they have a  good understanding of her treatment plan going forward. Pt is not one to not ask questions and get the answers,may not be the answers she wants but will ask Premorbid pt/family roles/activities: Wife, mother, retiree, etc Anticipated changes in roles/activities/participation: resume Pt/family expectations/goals: Pt states: " I don't want to be here long."  Husband states: " I hope she does well and makes a good recovery she hasa been through a lot already."  [employer]: None Premorbid Home Care/DME Agencies: Other (Comment) (has a tub seat) Transportation available at discharge: Husband Resource referrals recommended: Neuropsychology  Discharge Planning Living Arrangements: Spouse/significant other Support Systems: Spouse/significant other,Children,Other relatives Type of Residence: Private residence Insurance Resources: [employer] (specify) Scientist, clinical (histocompatibility and immunogenetics)) Financial Resources: SSD,Family Support Financial Screen Referred: No Living Expenses: Own Money Management: Spouse,Patient Does the patient have any problems obtaining your medications?: No Home Management: Husband, pt was prior to her illness Patient/Family Preliminary Plans: Return home with husband who is also retired and available to assist her at discharge. Pt voiced: " Not sure if I want him too."  Husband takes it in stride and ignores her. Discussed ELOS 3.5-4 weeks so will be with [place] a lenght of time. Husband aware will require 24/7 physical care at discharge and is prepared to provide this. Will work on discharge needs and safe plan. Care Coordinator Anticipated Follow Up Needs: HH/OP  Clinical Impression Somewhat cranky patient who  is curt in her answers and directs her husband to get what she is needing at the moment. Husband is involved and supportive and will be her caregiver at discharge. Will place on neuro-psych list for coping and work on safe discharge plan.  Elease Hashimoto 06/30/2020, 9:50 AM

## 2020-06-30 NOTE — Progress Notes (Signed)
Physical Therapy Session Note  Patient Details  Name: Tonya Myers MRN: [id number] Date of Birth: 05/07/1965  Today's Date: 06/30/2020 PT Amount of Missed Time (min): 49 Minutes PT Missed Treatment Reason: Patient fatigue;Pain;Patient unwilling to participate    Short Term Goals: Week 1:  PT Short Term Goal 1 (Week 1): Pt will perform supine to sit w/mod assist of 1 PT Short Term Goal 2 (Week 1): Pt will perform sit to stand w/mod assist of 2 consistently PT Short Term Goal 3 (Week 1): Pt will transfer bed to/from wc w/mod assist of 2 PT Short Term Goal 4 (Week 1): Pt will tolerate OOB in wc for at least 1 hr at a time  Skilled Therapeutic Interventions/Progress Updates:    Attempted to see patient for scheduled therapy session. Pt received supine in bed, declines participation due to fatigue and not wanting to do therapy this late in the day. Pt also reports pain in her neck and her side, not rated. Pt declines intervention for pain and per her husband is not due for pain medication for a few hours. Encouraged pt to sit up to EOB, pt declines. Encouraged pt to perform therex at bed level, pt declines. Pt missed 60 min of scheduled therapy session due to refusal.  Therapy Documentation Precautions:  Precautions Precautions: Fall,Back Restrictions Weight Bearing Restrictions: No Other Position/Activity Restrictions: soinal   Therapy/Group: Individual Therapy   Excell Seltzer, PT, DPT  06/30/2020, 4:25 PM

## 2020-07-01 DIAGNOSIS — G9340 Encephalopathy, unspecified: Secondary | ICD-10-CM | POA: Diagnosis not present

## 2020-07-01 DIAGNOSIS — I10 Essential (primary) hypertension: Secondary | ICD-10-CM | POA: Diagnosis not present

## 2020-07-01 DIAGNOSIS — D638 Anemia in other chronic diseases classified elsewhere: Secondary | ICD-10-CM | POA: Diagnosis not present

## 2020-07-01 DIAGNOSIS — G061 Intraspinal abscess and granuloma: Secondary | ICD-10-CM | POA: Diagnosis not present

## 2020-07-01 LAB — GLUCOSE, CAPILLARY
Glucose-Capillary: 120 mg/dL — ABNORMAL HIGH (ref 70–99)
Glucose-Capillary: 201 mg/dL — ABNORMAL HIGH (ref 70–99)
Glucose-Capillary: 179 mg/dL — ABNORMAL HIGH (ref 70–99)
Glucose-Capillary: 68 mg/dL — ABNORMAL LOW (ref 70–99)
Glucose-Capillary: 120 mg/dL — ABNORMAL HIGH (ref 70–99)

## 2020-07-01 MED ORDER — BISACODYL 10 MG RE SUPP
10.0000 mg | Freq: Every day | RECTAL | Status: DC | PRN
  Administered 2020-07-01 – 2020-07-20 (×4): 10 mg via RECTAL
  Filled 2020-07-01 (×4): qty 1

## 2020-07-01 NOTE — Progress Notes (Signed)
Physical Therapy Session Note  Patient Details  Name: Tonya Myers MRN: [id number] Date of Birth: 07-30-1965  Today's Date: 07/01/2020 PT Individual Time: 1110-1200 PT Individual Time Calculation (min): 50 min   Short Term Goals: Week 1:  PT Short Term Goal 1 (Week 1): Pt will perform supine to sit w/mod assist of 1 PT Short Term Goal 2 (Week 1): Pt will perform sit to stand w/mod assist of 2 consistently PT Short Term Goal 3 (Week 1): Pt will transfer bed to/from wc w/mod assist of 2 PT Short Term Goal 4 (Week 1): Pt will tolerate OOB in wc for at least 1 hr at a time  Skilled Therapeutic Interventions/Progress Updates:    Patient with nursing in the room initially.  Assisted NT for changing brief and pt rolling with mod cues and mod A to flex L knee and for extra turn to allow for hygiene.  Patient attempting bridging with mod cues for pulling up pants in bed, but unable to clear bed with hips.  Placed knee hi TED stockings as L foot with edema, but pt did not tolerate well.    Performed supine to sit from R sidelying with step by step cues and mod A for trunk.  Patient performed slide board transfer with +1 mod A (spouse close for safety holding chair).  Patient positioned back in w/c with tilt in space chair tilted and legrests on with min A.  Patient pushed in w/c to therapy gym.  She was already complaining of more stool coming out, but agreed to work while up and out of bed.  Patient in parallel bars to perform anterior weight shifts onto legs and pulling up on rails for "butt lifts" x 4 with mod A.    Patient assisted back to room in w/c and performed slide board transfer with mod to max A of 1 and another to assist with board placement, chair stabilization.  Patient sit to supine max A for legs and positioning trunk.  Performed more rolling in bed with mod A for hygiene.  Removed TEDs per pt preference.  Patient positioned on R side for comfort and left with call bell in reach and  spouse in the room, bed alarm active.   Therapy Documentation Precautions:  Precautions Precautions: Fall,Back Restrictions Weight Bearing Restrictions: No Other Position/Activity Restrictions: soinal General: PT Amount of Missed Time (min): 10 Minutes PT Missed Treatment Reason: Nursing care Pain: Pain Assessment Pain Scale: 0-10 Pain Score: 7  Pain Type: Neuropathic pain Pain Location: Foot Pain Orientation: Left Pain Descriptors / Indicators: Aching;Tightness;Tingling;Squeezing Pain Onset: Progressive Pain Intervention(s): Repositioned;Distraction;Other (Comment) (removed TED stocking)   Therapy/Group: Individual Therapy  Reginia Naas  Magda Kiel, PT 07/01/2020, 12:25 PM

## 2020-07-01 NOTE — Progress Notes (Signed)
Sutures removed, no complications. Dry dressing applied with tegaderm for comfort. Patient tolerated it well.

## 2020-07-01 NOTE — Progress Notes (Signed)
Occupational Therapy Session Note  Patient Details  Name: Tonya Myers MRN: [id number] Date of Birth: 07/29/1965  Today's Date: 07/01/2020 OT Individual Time: 1502-1530 OT Individual Time Calculation (min): 28 min    Short Term Goals: Week 1:  OT Short Term Goal 1 (Week 1): Pt will maintain sitting balance EOB with Mod A for ~5 minutes during self care activity OT Short Term Goal 2 (Week 1): Pt will complete 1 grooming task w/c level at the sink with no more than Mod A and mod cues OT Short Term Goal 3 (Week 1): Pt will remain OOB for ~45 minutes while engaging in ADL activity  Skilled Therapeutic Interventions/Progress Updates:    Pt in bed to start session with spouse present.  She needed mod instructional cueing for agreement to participate in therapy.  Initially, she was unable to state any back precautions, thus requiring max instructional cueing to recall.  Transitioned from supine to sit EOB following precautions with total assist +2 (pt 30%) to help keep pain to a minimum.  Once sitting, she maintained flexed trunk and head requiring max assist to correct.  When therapist would try to correct her head posture she would mostly resist movement and would perseverate on telling her husband to help her.  Decreased sustained attention throughout treatment requiring max instructional cueing to participate.  Completed sit to stand on the EOB X 3 during session with total assist +2 (pt less than 10%).  First attempt was with three muskateers technique and pt maintaining flexed trunk with decreased hip extension.  She was not assisting much with the sit to stand component and her knees buckled requiring total +2 to sit her back on the bed safely.  Pt perseverated on laying down throughout attempts yelling out "[name] help me lay down...Marland KitchenMarland KitchenCharles I can't".  She was difficulty to redirect.  Next two attempts to stand where with therapist in front to block her right knee and therapy tech on the left  side.  She was only able to sustain standing for 30 seconds or less with total assist +2 (pt 10%) again.  Flexed posture still present throughout with inability to achieve full upright trunk or cervical extension to neurtral in standing.  In sitting, pt would try to throw herself in all directions with therapist having to keep her from trying to lay back or to the sides.  Eventually, she was assisted with transition back to supine following precautions with total assist +2 (pt 30%).  She was left with her call button and phone in reach and spouse present.    Therapy Documentation Precautions:  Precautions Precautions: Fall,Back Restrictions Weight Bearing Restrictions: No Other Position/Activity Restrictions: soinal  Pain: Pain Assessment Pain Scale: Faces Pain Score: 0-No pain Faces Pain Scale: Hurts a little bit Pain Type: Acute pain Pain Location: Neck Pain Orientation: Posterior Pain Descriptors / Indicators: Discomfort Pain Onset: With Activity Pain Intervention(s): Repositioned;Distraction ADL: See Care Tool Section for some details of mobility and selfcare  Therapy/Group: Individual Therapy  MCGUIRE,JAMES OTR/L 07/01/2020, 4:20 PM

## 2020-07-01 NOTE — Progress Notes (Signed)
Speech Language Pathology Daily Session Note  Patient Details  Name: Tonya Myers MRN: [id number] Date of Birth: 1965/09/12  Today's Date: 07/01/2020 SLP Individual Time: 2683-4196 SLP Individual Time Calculation (min): 42 min  Short Term Goals: Week 1: SLP Short Term Goal 1 (Week 1): Pt will demonstrate selective attention in 15 minute intervals with min A verbal cues for redirection. SLP Short Term Goal 2 (Week 1): Pt will demonstrate mildly complex problem solving skills with supervision A verbal cues. SLP Short Term Goal 3 (Week 1): Pt will self-monitor and self-correct functional and verbal errors with supervision A verbal cues. SLP Short Term Goal 4 (Week 1): Pt will demonstrate recall of daily and novel information with aids given max A verbal cues. SLP Short Term Goal 5 (Week 1): Pt will demonstrate higher level word finding skills in structured and unstructured tasks with min A semantic cues.  Skilled Therapeutic Interventions:Skilled ST services focused on cognitive skills. SLP facilitated basic problem solving, selective attention and recall with task in [place] and medication management. Pt required max A verbal cues to count change, mod I to display change under a dollar and mod A verbal cues to display requested change more than a dollar. Pt required mod A verbal cues for error awareness in simple addition and substraction with written visual aid provided by ST. Pt required min A verbal cues for mildly complex problem solving in reading medication labels and pointing to items on pill organizer with difficulty with x3 a day medication. Pt was left in room with call bell within reach and bed alarm set. SLP recommends to continue skilled services.     Pain Pain Assessment Pain Scale: 0-10 Pain Score: 0-No pain Pain Type: Acute pain Pain Location: Foot Pain Orientation: Left Pain Descriptors / Indicators: Aching Pain Onset: Progressive Pain Intervention(s): Medication (See  eMAR)  Therapy/Group: Individual Therapy  Daneesha Quinteros  [facility] 07/01/2020, 4:09 PM

## 2020-07-01 NOTE — Progress Notes (Signed)
Occupational Therapy Session Note  Patient Details  Name: Tonya Myers MRN: [id number] Date of Birth: [birth year]  Today's Date: 07/01/2020 OT Individual Time: 0815-0900 OT Individual Time Calculation (min): 45 min    Short Term Goals: Week 1:  OT Short Term Goal 1 (Week 1): Pt will maintain sitting balance EOB with Mod A for ~5 minutes during self care activity OT Short Term Goal 2 (Week 1): Pt will complete 1 grooming task w/c level at the sink with no more than Mod A and mod cues OT Short Term Goal 3 (Week 1): Pt will remain OOB for ~45 minutes while engaging in ADL activity  Skilled Therapeutic Interventions/Progress Updates:    Pt received supine with nursing staff providing medication. Pt agreeable to OT session. Pt transferred to EOB with mod A, requiring cueing for LLE management and trunk management. Pt sat EOB with forward flexed posture, unable/unwiling to bring trunk up. Pt completed UB bathing with min A, mod A to don shirt. Pt very resistant to participate stating "this was done last night" despite frequent edu provided re purpose of OT. Pt with very flat affect and frustrated throughout session. Pt transferred back to supine with mod A for rest break and to don incontinence brief and pants with max A. Pt returned to EOB and completed slideboard transfer to the TIS w/c with max +2 A for safety. Pt had difficulty sequencing directions, managing trunk, and following commands. Pt was left sitting up in the TIS with chair alarm belt fastened.   Therapy Documentation Precautions:  Precautions Precautions: Fall,Back Restrictions Weight Bearing Restrictions: No Other Position/Activity Restrictions: soinal  Therapy/Group: Individual Therapy  Curtis Sites 07/01/2020, 6:38 AM

## 2020-07-01 NOTE — Progress Notes (Signed)
PROGRESS NOTE   Subjective/Complaints: Patient seen sitting up in bed this morning.  She states she slept fairly overnight due to people coming in and out of her room.  She states she felt she was improving, but this morning notes that she feels paralyzed.  When asked to explain, patient states that she cannot feed herself, but then observed feeding herself with both upper extremities without significant issue.  ROS: Unreliable due to cognition.  Objective:   No results found. Recent Labs    06/30/20 0832  WBC 7.9  HGB 7.9*  HCT 24.5*  PLT 454*   Recent Labs    06/30/20 0503  NA 139  K 4.2  CL 108  CO2 19*  GLUCOSE 89  BUN 11  CREATININE 0.71  CALCIUM 8.3*    Intake/Output Summary (Last 24 hours) at 07/01/2020 1004 Last data filed at 07/01/2020 0440 Gross per 24 hour  Intake 120 ml  Output 1100 ml  Net -980 ml        Physical Exam: Vital Signs Blood pressure (!) 150/80, pulse 100, temperature 98.1 F (36.7 C), temperature source Oral, resp. rate 18, height 6' (1.829 m), weight 99.7 kg, SpO2 98 %. Constitutional: No distress . Vital signs reviewed. HENT: Normocephalic.  Atraumatic. Eyes: EOMI. No discharge. Cardiovascular: No JVD.  Tachycardia.  Regular rhythm. Respiratory: Normal effort.  No stridor.  Bilateral clear to auscultation. GI: Non-distended.  BS +. Skin: Warm and dry.  Intact. Psych: Flat.  Slowed.  Confused. Musc: No edema in extremities.  No tenderness in extremities. Neurologic: Alert and oriented x2 Motor: Bilateral upper extremities: 4/5 proximal distal Bilateral lower extremities: Hip flexion 3/5, distally 4/5   Assessment/Plan: 1. Functional deficits which require 3+ hours per day of interdisciplinary therapy in a comprehensive inpatient rehab setting.  Physiatrist is providing close team supervision and 24 hour management of active medical problems listed below.  Physiatrist and  rehab team continue to assess barriers to discharge/monitor patient progress toward functional and medical goals  Care Tool:  Bathing  Bathing activity did not occur: Refused           Bathing assist       Upper Body Dressing/Undressing Upper body dressing Upper body dressing/undressing activity did not occur (including orthotics): Refused What is the patient wearing?: Pull over shirt    Upper body assist Assist Level: Maximal Assistance - Patient 25 - 49%    Lower Body Dressing/Undressing Lower body dressing    Lower body dressing activity did not occur: Refused What is the patient wearing?: Incontinence brief,Pants     Lower body assist Assist for lower body dressing: Total Assistance - Patient < 25%     Toileting Toileting Toileting Activity did not occur Landscape architect and hygiene only): Refused  Toileting assist Assist for toileting: Dependent - Patient 0%     Transfers Chair/bed transfer  Transfers assist     Chair/bed transfer assist level: 2 Helpers     Locomotion Ambulation   Ambulation assist      Assist level: 2 helpers Assistive device: [place] Max distance: 8ft   Walk 10 feet activity   Assist  Walk 10 feet activity did  not occur: Safety/medical concerns        Walk 50 feet activity   Assist Walk 50 feet with 2 turns activity did not occur: Safety/medical concerns         Walk 150 feet activity   Assist Walk 150 feet activity did not occur: Safety/medical concerns         Walk 10 feet on uneven surface  activity   Assist Walk 10 feet on uneven surfaces activity did not occur: Safety/medical concerns         Wheelchair     Assist Will patient use wheelchair at discharge?: Yes Type of Wheelchair: Manual Wheelchair activity did not occur: Safety/medical concerns         Wheelchair 50 feet with 2 turns activity    Assist    Wheelchair 50 feet with 2 turns activity did not occur:  Safety/medical concerns       Wheelchair 150 feet activity     Assist  Wheelchair 150 feet activity did not occur: Safety/medical concerns       Medical Problem List and Plan: 1.  Decreased functional ability secondary to acute encephalopathy.  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    Continue [name]   Plan to DC sutures today 2.  Antithrombotics: -DVT/anticoagulation: Continue Lovenox 52.5 mg daily.              -antiplatelet therapy: N/A 3. Pain Management: Oxycodone as needed  Appears controlled on 3/8 4. Mood: Team support             -antipsychotic agents: N/A 5. Neuropsych: This patient is not capable of making decisions on her own behalf. 6. Skin/Wound Care: Routine skin checks 7. Fluids/Electrolytes/Nutrition: Routine in and outs 8.  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  Continue IV Rocephin through 07/23/2020 and follow-up infectious disease. 9.  Diabetes mellitus with hyperglycemia/DKA.  Hemoglobin A1c 8.0.  NovoLog 5 units 3 times daily, Lantus insulin 45 units daily.  Check blood sugars before meals and at bedtime CBG (last 3)  Recent Labs    06/30/20 1649 06/30/20 2150 07/01/20 0600  GLUCAP 129* 78 120*   ?  Improving control on 3/8 10.  CLL.  Follow-up outpatient Dr. Delight Hoh 11.  Hypothyroidism.  Synthroid 12.  Anemia of chronic disease.  Multifactorial.   Hemoglobin 7.9 on 3/7  Continue to monitor 13.  Hypertension.  Dilacor120 mg daily,Micardis 80-25 mg daily           Vitals:   07/01/20 0351 07/01/20 0806  BP: (!) 143/78 (!) 150/80  Pulse: 100   Resp: 18   Temp: 98.1 F (36.7 C)   SpO2: 98%    Labile on 3/8, monitor for trend 14.  Transaminitis.    LFTs elevated on 3/7, continue to  monitor 15.  Obesity.  BMI 31.19.  Dietary follow-up 16.  History of asthma.  Continue Singulair 10 mg nightly.   17.  History of genital herpes.  Conitnue Valtrex 18.  Sleep disturbance  Melatonin  Appears to be improving overall  LOS: 4 days A FACE TO FACE EVALUATION WAS PERFORMED  Derold Dorsch Lorie Phenix 07/01/2020, 10:04 AM

## 2020-07-02 DIAGNOSIS — D638 Anemia in other chronic diseases classified elsewhere: Secondary | ICD-10-CM | POA: Diagnosis not present

## 2020-07-02 DIAGNOSIS — G9340 Encephalopathy, unspecified: Secondary | ICD-10-CM | POA: Diagnosis not present

## 2020-07-02 DIAGNOSIS — I10 Essential (primary) hypertension: Secondary | ICD-10-CM | POA: Diagnosis not present

## 2020-07-02 DIAGNOSIS — E119 Type 2 diabetes mellitus without complications: Secondary | ICD-10-CM | POA: Diagnosis not present

## 2020-07-02 DIAGNOSIS — K5901 Slow transit constipation: Secondary | ICD-10-CM

## 2020-07-02 LAB — GLUCOSE, CAPILLARY
Glucose-Capillary: 83 mg/dL (ref 70–99)
Glucose-Capillary: 220 mg/dL — ABNORMAL HIGH (ref 70–99)
Glucose-Capillary: 208 mg/dL — ABNORMAL HIGH (ref 70–99)
Glucose-Capillary: 144 mg/dL — ABNORMAL HIGH (ref 70–99)

## 2020-07-02 MED ORDER — POLYETHYLENE GLYCOL 3350 17 G PO PACK: 17.0000 g | PACK | Freq: Every day | ORAL | Status: DC

## 2020-07-02 MED ORDER — POLYETHYLENE GLYCOL 3350 17 G PO PACK
17.0000 g | PACK | Freq: Two times a day (BID) | ORAL | Status: DC
  Administered 2020-07-02 – 2020-07-13 (×18): 17 g via ORAL
  Filled 2020-07-02 (×25): qty 1

## 2020-07-02 NOTE — Patient Care Conference (Signed)
Inpatient RehabilitationTeam Conference and Plan of Care Update Date: 07/02/2020   Time: 11:53 AM    Patient Name: Tonya Myers      Medical Record Number: [id number]  Date of Birth: Apr 11, 1966 Sex: Female         Room/Bed: 4W20C/4W20C-01 Payor Info: Payor: MEDICARE / Plan: MEDICARE PART A AND B / Product Type: *No Product type* /    Admit Date/Time:  06/27/2020 12:46 PM  Primary Diagnosis:  Acute encephalopathy  Hospital Problems: Principal Problem:   Acute encephalopathy Active Problems:   CLL (chronic lymphocytic leukemia) (HCC)   Acute hemorrhoid   DDD (degenerative disc disease), cervical   Cervical disc disorder with radiculopathy of cervical region   Cervical facet syndrome   DDD (degenerative disc disease), lumbar   Facet syndrome, lumbar   Bilateral occipital neuralgia   Sacroiliac joint dysfunction   DJD of shoulder   Hypothyroidism   Arthritis   Carpal tunnel syndrome, bilateral   Chronic pain in right shoulder   Controlled type 2 diabetes mellitus without complication, without long-term current use of insulin (HCC)   Essential hypertension   Herpes simplex vulvovaginitis   Pneumococcal meningitis   Bacterial spinal epidural abscess   Meningitis   Labile blood glucose   Uncontrolled type 2 diabetes mellitus with hyperosmolar nonketotic hyperglycemia (HCC)   Transaminitis   Sleep disturbance   Anemia of chronic disease   Slow transit constipation    Expected Discharge Date: Expected Discharge Date: 07/24/20  Team Members Present: Physician leading conference: Dr. Delice Lesch Care Coodinator Present: [name], RN, BSN, CRRN;Becky Dupree, [name] Nurse Present: Other (comment) Pietro Cassis, RN) PT Present: Magda Kiel, PT OT Present: Elisabeth Most, OT SLP Present: Charolett Bumpers, SLP PPS Coordinator present : Gunnar Fusi, Novella Olive, PT     Current Status/Progress Goal Weekly Team Focus  Bowel/Bladder   Incontinent of bowel and bladder.  Difficulty voiding, high PVR's, I&O cath q8 for no void.  fewer incidence of cathing  Assess toileting needs qshift and PRN   Swallow/Nutrition/ Hydration             ADL's   max-total with ADLs, BP support/behaviors impacting participation, +2 for transfers  min A overall  sitting/standing balance, behavior management, upright tolerance, activity tolernace   Mobility   mod to max A bed mobility, slide board transfers mod A +2 for safety, sit to stand +2, [place] walker +2 A 3' ambulation  min A 50' ambulation, w/c mobility S  upright tolerance, balance sitting, transfers (slide board), cogition, general strengthening, some caregiver ed   Communication   Min A higher level word finding  Mod I  higher level word finding in conversation   Safety/Cognition/ Behavioral Observations  Max-Mod A  Min A, Mod I selective attention 15 intervals  mildly complex problem solving, error awarenes, selective attention and recall   Pain   No c/o pain  pain less than 3  assess pain qshift and PRN   Skin   Surgical incision to back dry dressing in place.  Promate proper wound healing, keep free of infection  assess skin qshift and PRN     Discharge Planning:  Home with husband who can assist with her care, pt has a strong will and stubborn and this limits her at times.   Team Discussion: Pain controlled, CBGs variable with anemia being monitored. Requiring I+O catheterizations for urinary retention. Confusion has improved with significant other at bedside.   Patient on target to meet rehab goals:  Currently +2 assist for transfers due to poor standing balance. Able to ambulate 3' with [place] walker and sitting in tilt in space wheelchair. Max - total assist for ADLs. Mod-Max assist for problem solving and memory with interal distractions. Mod I goals set for discharge.  *See Care Plan and progress notes for long and short-term goals.   Revisions to Treatment Plan:   Teaching Needs: Safety, medications,  transfers, toileting, etc.   Current Barriers to Discharge: Decreased caregiver support, Behavior and required I+O catheterizations for urinary elimination  Possible Resolutions to Barriers:  Family education     Medical Summary Current Status: Decreased functional ability secondary to acute encephalopathy  Barriers to Discharge: Medical stability;Behavior;IV antibiotics   Possible Resolutions to [employer] Focus: Optimize DM/HTN meds, follow labs - Hb, LFTs, optimize bowel meds   Continued Need for Acute Rehabilitation Level of Care: The patient requires daily medical management by a physician with specialized training in physical medicine and rehabilitation for the following reasons: Direction of a multidisciplinary physical rehabilitation program to maximize functional independence : Yes Medical management of patient stability for increased activity during participation in an intensive rehabilitation regime.: Yes Analysis of laboratory values and/or radiology reports with any subsequent need for medication adjustment and/or medical intervention. : Yes   I attest that I was present, lead the team conference, and concur with the assessment and plan of the team.   [name] B 07/02/2020, 3:37 PM

## 2020-07-02 NOTE — Progress Notes (Signed)
Physical Therapy Session Note  Patient Details  Name: Tonya Myers MRN: [id number] Date of Birth: Apr 14, 1966  Today's Date: 07/02/2020 PT Individual Time: 1045-1130 PT Individual Time Calculation (min): 45 min   Short Term Goals: Week 1:  PT Short Term Goal 1 (Week 1): Pt will perform supine to sit w/mod assist of 1 PT Short Term Goal 2 (Week 1): Pt will perform sit to stand w/mod assist of 2 consistently PT Short Term Goal 3 (Week 1): Pt will transfer bed to/from wc w/mod assist of 2 PT Short Term Goal 4 (Week 1): Pt will tolerate OOB in wc for at least 1 hr at a time  Skilled Therapeutic Interventions/Progress Updates:    Patient in supine and reports ongoing issues with her bowels and offered to get on [facility].  Spouse in the room and reports they just recently got her on the bedpan.  Patient rolled to R and side to sit with cues and mod A.  Patient squat pivot to padded drop arm BSC with +2 mod to max A.  Patient seated with close S for safety on BSC able to have BM.  Assisted squat pivot to bed with +2 A . Sit to supine max A and rolling with mod A for hygiene and donning clean brief.  In supine donned pants with total A, but pt able to bridge x 2 for getting pants pulled up.  Rolled and side to sit mod A.  Patient transferred to w/c via slide board with +2 A for safety as sliding forward on board.  Patient in tilt in space pushed to dayroom and performed 2 minutes at 30 cm/sec on Kinetron for LE strengthening.  Patient assisted to room and left tilted with seat belt and alarm in place, spouse in the room.   Therapy Documentation Precautions:  Precautions Precautions: Fall,Back Restrictions Weight Bearing Restrictions: No Other Position/Activity Restrictions: soinal Pain: Pain Assessment Faces Pain Scale: Hurts little more Pain Type: Acute pain Pain Location: Leg Pain Orientation: Right;Left Pain Descriptors / Indicators: Guarding Pain Onset: With Activity Pain Intervention(s):  Cutaneous stimulation   Therapy/Group: Individual Therapy  Reginia Naas  [place], PT 07/02/2020, 4:20 PM

## 2020-07-02 NOTE — Progress Notes (Signed)
Patient ID: Tonya Myers, female   DOB: 1966-04-10, 55 y.o.   MRN: [id number]  Met with pt and husband who is in her room to discuss team conference goals -min level of assist and target discharge date 3/31. Pt is very pleased having a discharge date and something to look forward to it. She is concerned about having to be cathed and has used flomax in the past. MD aware and working on this. Husband will be providing assist at discharge and is a strong support of her. Pt voiced she is doing her best and knows she has a ways to go from here. Will continue to work on discharge needs. On neuro-psych list to see while here.

## 2020-07-02 NOTE — Progress Notes (Signed)
PROGRESS NOTE   Subjective/Complaints: Patient seen sitting up in bed this morning.  She states she slept well overnight.  She states that she is constipated.  Discussed inconsistent bowel pattern with nursing.  ROS: + Constipation.  Appears to deny CP, shortness of breath, nausea, vomiting, diarrhea.  Objective:   No results found. Recent Labs    06/30/20 0832  WBC 7.9  HGB 7.9*  HCT 24.5*  PLT 454*   Recent Labs    06/30/20 0503  NA 139  K 4.2  CL 108  CO2 19*  GLUCOSE 89  BUN 11  CREATININE 0.71  CALCIUM 8.3*    Intake/Output Summary (Last 24 hours) at 07/02/2020 1036 Last data filed at 07/02/2020 1016 Gross per 24 hour  Intake 370 ml  Output 1675 ml  Net -1305 ml        Physical Exam: Vital Signs Blood pressure 132/67, pulse 97, temperature 99 F (37.2 C), resp. rate 18, height 6' (1.829 m), weight 99.7 kg, SpO2 99 %. Constitutional: No distress . Vital signs reviewed. HENT: Normocephalic.  Atraumatic. Eyes: EOMI. No discharge. Cardiovascular: No JVD.  Tachycardia.  Regular rhythm. Respiratory: Normal effort.  No stridor.  Bilateral clear to auscultation. GI: Non-distended.  Hypoactive bowel sounds.  Skin: Warm and dry.   Back incision CDI Psych: Flat.  Slowed.?  Some improvement Musc: No edema in extremities.  No tenderness in extremities. Neurologic: Alert and oriented x2 Motor: Bilateral upper extremities: 4/5 proximal distal Bilateral lower extremities: Hip flexion 3/5, distally 4/5, stable   Assessment/Plan: 1. Functional deficits which require 3+ hours per day of interdisciplinary therapy in a comprehensive inpatient rehab setting.  Physiatrist is providing close team supervision and 24 hour management of active medical problems listed below.  Physiatrist and rehab team continue to assess barriers to discharge/monitor patient progress toward functional and medical goals  Care  Tool:  Bathing  Bathing activity did not occur: Refused           Bathing assist       Upper Body Dressing/Undressing Upper body dressing Upper body dressing/undressing activity did not occur (including orthotics): Refused What is the patient wearing?: Pull over shirt    Upper body assist Assist Level: Maximal Assistance - Patient 25 - 49%    Lower Body Dressing/Undressing Lower body dressing    Lower body dressing activity did not occur: Refused What is the patient wearing?: Incontinence brief,Pants     Lower body assist Assist for lower body dressing: Total Assistance - Patient < 25%     Toileting Toileting Toileting Activity did not occur Landscape architect and hygiene only): Refused  Toileting assist Assist for toileting: Dependent - Patient 0%     Transfers Chair/bed transfer  Transfers assist     Chair/bed transfer assist level: 2 Helpers     Locomotion Ambulation   Ambulation assist      Assist level: 2 helpers Assistive device: [place] Max distance: 86ft   Walk 10 feet activity   Assist  Walk 10 feet activity did not occur: Safety/medical concerns        Walk 50 feet activity   Assist Walk 50 feet with 2 turns  activity did not occur: Safety/medical concerns         Walk 150 feet activity   Assist Walk 150 feet activity did not occur: Safety/medical concerns         Walk 10 feet on uneven surface  activity   Assist Walk 10 feet on uneven surfaces activity did not occur: Safety/medical concerns         Wheelchair     Assist Will patient use wheelchair at discharge?: Yes Type of Wheelchair: Manual Wheelchair activity did not occur: Safety/medical concerns         Wheelchair 50 feet with 2 turns activity    Assist    Wheelchair 50 feet with 2 turns activity did not occur: Safety/medical concerns       Wheelchair 150 feet activity     Assist  Wheelchair 150 feet activity did not occur:  Safety/medical concerns       Medical Problem List and Plan: 1.  Decreased functional ability secondary to acute encephalopathy.  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    Continue [name]   Team conference today to discuss current and goals and coordination of care, home and environmental barriers, and discharge planning with nursing, case manager, and therapies. Please see conference note from today as well.  2.  Antithrombotics: -DVT/anticoagulation: Continue Lovenox 52.5 mg daily.              -antiplatelet therapy: N/A 3. Pain Management: Oxycodone as needed  Appears controlled on 3/9 4. Mood: Team support             -antipsychotic agents: N/A 5. Neuropsych: This patient is not capable of making decisions on her own behalf. 6. Skin/Wound Care: Routine skin checks 7. Fluids/Electrolytes/Nutrition: Routine in and outs 8.  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  Continue IV Rocephin through 07/23/2020 and follow-up infectious disease. 9.  Diabetes mellitus with hyperglycemia/DKA.  Hemoglobin A1c 8.0.  NovoLog 5 units 3 times daily, Lantus insulin 45 units daily.  Check blood sugars before meals and at bedtime CBG (last 3)  Recent Labs    07/01/20 2122 07/01/20 2159 07/02/20 0631  GLUCAP 68* 120* 83   Labile on 3/9, monitor for trend 10.  CLL.  Follow-up outpatient Dr. Delight Hoh 11.  Hypothyroidism.  Synthroid 12.  Anemia of chronic disease.  Multifactorial.   Hemoglobin 7.9 on 3/7  Continue to monitor 13.  Hypertension.  Dilacor120 mg daily,Micardis 80-25 mg daily           Vitals:   07/02/20 0531 07/02/20 0645  BP: (!) 85/75 132/67  Pulse: 90 97  Resp: 18   Temp: 99 F (37.2 C)   SpO2: 99%    Labile on 3/9, monitor for  trend 14.  Transaminitis.    LFTs elevated on 3/7, continue to monitor 15.  Obesity.  BMI 31.19.  Dietary follow-up 16.  History of asthma.  Continue Singulair 10 mg nightly.   17.  History of genital herpes.  Conitnue Valtrex 18.  Sleep disturbance  Melatonin  Appears to be improving overall 19.  Slow transit constipation  KUB ordered  Bowel meds increased on 3/9  LOS: 5 days A FACE TO FACE EVALUATION WAS PERFORMED  Tonya Myers 07/02/2020, 10:36 AM

## 2020-07-02 NOTE — Progress Notes (Signed)
Physical Therapy Session Note  Patient Details  Name: Tonya Myers MRN: [id number] Date of Birth: 10-17-1965  Today's Date: 07/02/2020 PT Individual Time: 1435-1500 PT Individual Time Calculation (min): 25 min   Short Term Goals: Week 1:  PT Short Term Goal 1 (Week 1): Pt will perform supine to sit w/mod assist of 1 PT Short Term Goal 2 (Week 1): Pt will perform sit to stand w/mod assist of 2 consistently PT Short Term Goal 3 (Week 1): Pt will transfer bed to/from wc w/mod assist of 2 PT Short Term Goal 4 (Week 1): Pt will tolerate OOB in wc for at least 1 hr at a time Week 2:    Week 3:     Skilled Therapeutic Interventions/Progress Updates:    Pain:  Pt denies pain.  Treatment to tolerance.  Rest breaks and repositioning as needed.  Pt initially supine, awake, alert and agreeable to treatment session w/focus on mobility. Pt rolls to R w/cues, side to sit w/mod assist.    cga for sitting balance on edge of bed, scoots w/tactile cues for sequencing. Sit to stand from edge of bed w/bed elevated, feet blocked, knees blocked, + 2 max assist.   Repeated x 2, stands approx 10sec before returning to sitting, c/o dizzyness. Sit to supine w/max assist for LE management, verbal and tactile cues for sequencing. bilat knee hi ted hose applied by therapist. Pt supine to side to sit w/mod assist.   Repeated Sit to stand x 2 as described above, tactile cues for trunk and hip extension, cues for upward gaze.   Pt incontinent of bowels, returned sit, sit to supine as above.  teds removed by therapist/pt c/o discomfort wearing.  Pt rolled L w/rail and cues only.  NT assting w/clothing/brief.  Pt handed off to NT to complete as session timed out.  Husband in room and supports wife during session.  Therapy Documentation Precautions:  Precautions Precautions: Fall,Back Restrictions Weight Bearing Restrictions: No Other Position/Activity Restrictions: soinal   Therapy/Group: Individual Therapy   Callie Fielding, [place] 07/02/2020, 3:48 PM

## 2020-07-02 NOTE — Progress Notes (Signed)
Occupational Therapy Session Note  Patient Details  Name: Tonya Myers MRN: [id number] Date of Birth: 11-20-1965  Today's Date: 07/02/2020 OT Individual Time: 4098-1191 OT Individual Time Calculation (min): 10 min  and Today's Date: 07/02/2020 OT Missed Time: 50 Minutes Missed Time Reason: Patient fatigue   Short Term Goals: Week 1:  OT Short Term Goal 1 (Week 1): Pt will maintain sitting balance EOB with Mod A for ~5 minutes during self care activity OT Short Term Goal 2 (Week 1): Pt will complete 1 grooming task w/c level at the sink with no more than Mod A and mod cues OT Short Term Goal 3 (Week 1): Pt will remain OOB for ~45 minutes while engaging in ADL activity  Skilled Therapeutic Interventions/Progress Updates:    Limited treatment session due to pt refusal secondary to fatigue. Pt received supine in bed with husband present.  Pt reports fatigue and requesting to reschedule for tomorrow.  Educated pt on purpose of therapy and encouraging pt to engage in activity seated at EOB.  Pt reports working with therapy earlier today and remaining upright "longer than planned".  Pt reports "I will do double tomorrow to make up for today".  Pt continues to report too fatigued due to not getting good sleep at night, husband things it may be anxiety contributing.  Notified Silvestre Mesi, PA, about pt and husband concerns with lack of sleep.    Therapy Documentation Precautions:  Precautions Precautions: Fall,Back Restrictions Weight Bearing Restrictions: No Other Position/Activity Restrictions: soinal General: General OT Amount of Missed Time: 50 Minutes Vital Signs: Therapy Vitals Temp: 98.5 F (36.9 C) Temp Source: Oral Pulse Rate: (!) 101 Resp: 18 BP: 119/64 Patient Position (if appropriate): Lying Oxygen Therapy SpO2: 91 % O2 Device: Room Air Pain:  Pt with no c/o pain   Therapy/Group: Individual Therapy  Simonne Come 07/02/2020, 2:41 PM

## 2020-07-02 NOTE — Progress Notes (Signed)
Speech Language Pathology Daily Session Note  Patient Details  Name: Braylie Badami MRN: [id number] Date of Birth: 1965/05/11  Today's Date: 07/02/2020 SLP Individual Time: 0321-2248 SLP Individual Time Calculation (min): 41 min  Short Term Goals: Week 1: SLP Short Term Goal 1 (Week 1): Pt will demonstrate selective attention in 15 minute intervals with min A verbal cues for redirection. SLP Short Term Goal 2 (Week 1): Pt will demonstrate mildly complex problem solving skills with supervision A verbal cues. SLP Short Term Goal 3 (Week 1): Pt will self-monitor and self-correct functional and verbal errors with supervision A verbal cues. SLP Short Term Goal 4 (Week 1): Pt will demonstrate recall of daily and novel information with aids given max A verbal cues. SLP Short Term Goal 5 (Week 1): Pt will demonstrate higher level word finding skills in structured and unstructured tasks with min A semantic cues.  Skilled Therapeutic Interventions:Skilled ST services focused on cognitive skills. SLP facilitated basic problem solving and directing care during self-feeding of breakfast tray. Pt was able to self-feed once SLP placed food on fork or handed cup fading to pt's ability to place items on fork with supervision A. Pt was able to direct care with supervision A verbal cues. Pt demonstrated selective attention with door open during meal in 15 minute interval with min A verbal cues for redirection. SLP also facilitated problem solving and error awareness skills in 4-5 card sequencing task. Pt demonstrated ability to sequence 4 cards with supervision A verbal cues only due to attention when a staff member entered the room. Pt required mod A fade to min A verbal cues in 5 step card sequence task for error awareness and problem solving. Pt was left in room with call bell within reach and bed alarm set. SLP recommends to continue skilled services.     Pain Pain Assessment Pain Scale: 0-10 Pain Score: 0-No  pain Pain Type: Acute pain Pain Location: Neck  Therapy/Group: Individual Therapy  [place]  [facility] 07/02/2020, 9:30 AM

## 2020-07-02 NOTE — Progress Notes (Signed)
Hypoglycemic Event  CBG: 68  Treatment: 4 oz juice/soda  Symptoms: None  Follow-up CBG: Time:2159 CBG Result:120  Possible Reasons for Event: Unknown  Comments/MD notified: Marlowe Shores PA    Kathreen Cosier

## 2020-07-03 DIAGNOSIS — D638 Anemia in other chronic diseases classified elsewhere: Secondary | ICD-10-CM | POA: Diagnosis not present

## 2020-07-03 DIAGNOSIS — G9340 Encephalopathy, unspecified: Secondary | ICD-10-CM | POA: Diagnosis not present

## 2020-07-03 DIAGNOSIS — G061 Intraspinal abscess and granuloma: Secondary | ICD-10-CM | POA: Diagnosis not present

## 2020-07-03 DIAGNOSIS — I10 Essential (primary) hypertension: Secondary | ICD-10-CM | POA: Diagnosis not present

## 2020-07-03 LAB — GLUCOSE, CAPILLARY
Glucose-Capillary: 162 mg/dL — ABNORMAL HIGH (ref 70–99)
Glucose-Capillary: 264 mg/dL — ABNORMAL HIGH (ref 70–99)
Glucose-Capillary: 89 mg/dL (ref 70–99)
Glucose-Capillary: 73 mg/dL (ref 70–99)

## 2020-07-03 NOTE — Progress Notes (Addendum)
Occupational Therapy Session Note  Patient Details  Name: Tonya Myers MRN: [id number] Date of Birth: 1965/12/01  Today's Date: 07/03/2020 OT Individual Time: 1100-1153 OT Individual Time Calculation (min): 53 min    Short Term Goals: Week 1:  OT Short Term Goal 1 (Week 1): Pt will maintain sitting balance EOB with Mod A for ~5 minutes during self care activity OT Short Term Goal 2 (Week 1): Pt will complete 1 grooming task w/c level at the sink with no more than Mod A and mod cues OT Short Term Goal 3 (Week 1): Pt will remain OOB for ~45 minutes while engaging in ADL activity  Skilled Therapeutic Interventions/Progress Updates:    Pt received semi-reclined in bed with RN and husband present, agreeable to therapy. Session focus on functional mobility/ transfers, LBD, and BLE strengthening in prep for improved ADL performance. Side roll > L with min A and mod A to come into sitting, VCs throughout for log roll technique. Pt c/o of dizziness, req to lay down before  BP read. Req to use bed pan, mod A to roll R/L to apply bed pan, no void, but total A for pericare. Came back into sitting with mod A. C/o of minimal dizziness, BP read at 137/112 (122). Sx resolved with extended break. Donned pants with max A, donned B socks total A bed level. STS x2 via three musketeer technique with min A + 2, pt able to maintain standing for ~ 7 seconds then ~4 seconds before initiating seated rest break. SB transfer to TIS with mod A + 2, pt demonstrating better initiation and ability to push up through [place]. Pt c/o of constipation, willing to try to use BSC. SB transfer back into bed with max A + 2 as pt with decreased initiation. Attempted SB transfer then to [facility], but pt c/o of fatigue and requesting to lay back down. Mod A to progress BLE onto bed. Total A + 2 to scoot back up into bed. Pt completed 1x10 bridges with max A + 2 to assist clearing buttocks off bed and BLE raises.   Pt left semi-reclined in bed with  bed alarm engaged, call bell in reach, husband present, and all immediate needs met.    Therapy Documentation Precautions:  Precautions Precautions: Fall,Back Restrictions Weight Bearing Restrictions: No Other Position/Activity Restrictions: soinal Pain: Pain Assessment Pain Scale: 0-10 Pain Score: 6  Pain Type: Acute pain Pain Location: Back Pain Intervention(s): Medication (See eMAR) ADL: See Care Tool for more details.   Therapy/Group: Individual Therapy  Volanda Napoleon MS, OTR/L  07/03/2020, 7:00 AM

## 2020-07-03 NOTE — Progress Notes (Signed)
Physical Therapy Session Note  Patient Details  Name: Tonya Myers MRN: [id number] Date of Birth: 16-Nov-1965  Today's Date: 07/03/2020 PT Individual Time: 1400-1425 PT Individual Time Calculation (min): 25 min   Short Term Goals: Week 1:  PT Short Term Goal 1 (Week 1): Pt will perform supine to sit w/mod assist of 1 PT Short Term Goal 2 (Week 1): Pt will perform sit to stand w/mod assist of 2 consistently PT Short Term Goal 3 (Week 1): Pt will transfer bed to/from wc w/mod assist of 2 PT Short Term Goal 4 (Week 1): Pt will tolerate OOB in wc for at least 1 hr at a time  Skilled Therapeutic Interventions/Progress Updates:     Patient received from [place], PT in main therapy gym at beginning of session. Patient alert and agreeable to session, however reporting increased bladder pressure and cramping in bowls and fatigue. Focused session on toileting and returning patient to bed due to fatigue. Patients husband present and provided emotional support for patient throughout session.   Patient transported back to her room with dependent assist for time/energy conservation. Patient performed slide board transfers w/c>BSC and BSC>bed with max-total A +2 and total A for board placement. Provided cues for board placement, hand placement, sequencing, and head-hips relationship with facilitation. On BSC patient performed lateral leans with max A +2 to attempt to remove pants and brief. Patient unable to successfully clear her hips with assistance due to increased fatigue and worsening back pain. Returned patient to the bed, as above, for toileting due to patients fatigue and pain levels. Patient performed sit to supine and scooting up in the bed with total A +2 due to fatigue.   Patient cried out in pain during scooting up in the bed, severe pain lasted >3 min. PT provided cues for diaphragmatic breathing, emotional support, and guided imagery to assist with pain management. Patient reported 15/10 upper  back and neck pain at end of session and 10/10 bladder pain, NT and RN made aware. Patient handed off to nursing staff for toileting at end of session.    Therapy Documentation Precautions:  Precautions Precautions: Fall,Back Restrictions Weight Bearing Restrictions: No Other Position/Activity Restrictions: soinal   Therapy/Group: Individual Therapy  Cresta Riden L Rowena Moilanen PT, DPT  07/03/2020, 5:10 PM

## 2020-07-03 NOTE — Progress Notes (Signed)
PROGRESS NOTE   Subjective/Complaints: Patient seen sitting up in bed this morning being fed breakfast.  She states she slept well overnight.  Asked patient why she was being fed breakfast as she was able to feed herself yesterday.  Patient notes she had a good bowel movement yesterday and feels better.  ROS: Denies CP, shortness of breath, nausea, vomiting, diarrhea.  Objective:   No results found. No results for input(s): WBC, HGB, HCT, PLT in the last 72 hours. No results for input(s): NA, K, CL, CO2, GLUCOSE, BUN, CREATININE, CALCIUM in the last 72 hours.  Intake/Output Summary (Last 24 hours) at 07/03/2020 1107 Last data filed at 07/03/2020 1033 Gross per 24 hour  Intake 840 ml  Output 1400 ml  Net -560 ml        Physical Exam: Vital Signs Blood pressure 130/66, pulse 89, temperature 98.6 F (37 C), resp. rate 18, height 6' (1.829 m), weight 99.7 kg, SpO2 96 %. Constitutional: No distress . Vital signs reviewed. HENT: Normocephalic.  Atraumatic. Eyes: EOMI. No discharge. Cardiovascular: No JVD.  Tachycardia.  Regular rhythm. Respiratory: Normal effort.  No stridor.  Bilateral clear to auscultation. GI: Non-distended.  BS +. Skin: Warm and dry.  Intact. Psych: Flat.  Slowed. Musc: No edema in extremities.  No tenderness in extremities. Neurologic: Alert  Motor: Bilateral upper extremities: 4/5 proximal distal Bilateral lower extremities: Hip flexion 3/5, distally 4/5, unchanged Bilateral upper extremity ataxia  Assessment/Plan: 1. Functional deficits which require 3+ hours per day of interdisciplinary therapy in a comprehensive inpatient rehab setting.  Physiatrist is providing close team supervision and 24 hour management of active medical problems listed below.  Physiatrist and rehab team continue to assess barriers to discharge/monitor patient progress toward functional and medical goals  Care  Tool:  Bathing  Bathing activity did not occur: Refused           Bathing assist       Upper Body Dressing/Undressing Upper body dressing Upper body dressing/undressing activity did not occur (including orthotics): Refused What is the patient wearing?: Pull over shirt    Upper body assist Assist Level: Maximal Assistance - Patient 25 - 49%    Lower Body Dressing/Undressing Lower body dressing    Lower body dressing activity did not occur: Refused What is the patient wearing?: Incontinence brief,Pants     Lower body assist Assist for lower body dressing: Total Assistance - Patient < 25%     Toileting Toileting Toileting Activity did not occur Landscape architect and hygiene only): Refused  Toileting assist Assist for toileting: Dependent - Patient 0%     Transfers Chair/bed transfer  Transfers assist     Chair/bed transfer assist level: 2 Helpers     Locomotion Ambulation   Ambulation assist      Assist level: 2 helpers Assistive device: [place] Max distance: 32ft   Walk 10 feet activity   Assist  Walk 10 feet activity did not occur: Safety/medical concerns        Walk 50 feet activity   Assist Walk 50 feet with 2 turns activity did not occur: Safety/medical concerns         Walk 150 feet  activity   Assist Walk 150 feet activity did not occur: Safety/medical concerns         Walk 10 feet on uneven surface  activity   Assist Walk 10 feet on uneven surfaces activity did not occur: Safety/medical concerns         Wheelchair     Assist Will patient use wheelchair at discharge?: Yes Type of Wheelchair: Manual Wheelchair activity did not occur: Safety/medical concerns         Wheelchair 50 feet with 2 turns activity    Assist    Wheelchair 50 feet with 2 turns activity did not occur: Safety/medical concerns       Wheelchair 150 feet activity     Assist  Wheelchair 150 feet activity did not occur:  Safety/medical concerns       Medical Problem List and Plan: 1.  Decreased functional ability secondary to acute encephalopathy.  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    Continue [name]  2.  Antithrombotics: -DVT/anticoagulation: Continue Lovenox 52.5 mg daily.              -antiplatelet therapy: N/A 3. Pain Management: Oxycodone as needed  Appears controlled on 3/10 4. Mood: Team support             -antipsychotic agents: N/A 5. Neuropsych: This patient is not capable of making decisions on her own behalf. 6. Skin/Wound Care: Routine skin checks 7. Fluids/Electrolytes/Nutrition: Routine in and outs 8.  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  Continue IV Rocephin through 07/23/2020 and follow-up infectious disease. 9.  Diabetes mellitus with hyperglycemia/DKA.  Hemoglobin A1c 8.0.  NovoLog 5 units 3 times daily, Lantus insulin 45 units daily.  Check blood sugars before meals and at bedtime CBG (last 3)  Recent Labs    07/02/20 1650 07/02/20 2103 07/03/20 0555  GLUCAP 208* 144* 162*   Elevated on 3/10, but stabilizing, will increase medications tomorrow if persistent 10.  CLL.  Follow-up outpatient Dr. Delight Hoh 11.  Hypothyroidism.  Synthroid 12.  Anemia of chronic disease.  Multifactorial.   Hemoglobin 7.9 on 3/7, labs ordered for tomorrow  Continue to monitor 13.  Hypertension.  Dilacor120 mg daily,Micardis 80-25 mg daily           Vitals:   07/03/20 0428 07/03/20 1059  BP: 127/65 130/66  Pulse: 89   Resp: 18   Temp: 98.6 F (37 C)   SpO2: 96%    Controlled on 3/10 14.  Transaminitis.    LFTs elevated on 3/7, continue to monitor 15.  Obesity.  BMI 31.19.  Dietary follow-up 16.  History of asthma.  Continue Singulair  10 mg nightly.   17.  History of genital herpes.  Conitnue Valtrex 18.  Sleep disturbance  Melatonin  Appears to be improving overall 19.  Slow transit constipation  Bowel meds increased on 3/9  Improving  LOS: 6 days A FACE TO FACE EVALUATION WAS PERFORMED  Ankit Lorie Phenix 07/03/2020, 11:07 AM

## 2020-07-03 NOTE — Progress Notes (Signed)
Physical Therapy Session Note  Patient Details  Name: Traniya Prichett MRN: [id number] Date of Birth: [birth year]  Today's Date: 07/03/2020 PT Individual Time: 1302-1358 PT Individual Time Calculation (min): 56 min   Short Term Goals: Week 1:  PT Short Term Goal 1 (Week 1): Pt will perform supine to sit w/mod assist of 1 PT Short Term Goal 2 (Week 1): Pt will perform sit to stand w/mod assist of 2 consistently PT Short Term Goal 3 (Week 1): Pt will transfer bed to/from wc w/mod assist of 2 PT Short Term Goal 4 (Week 1): Pt will tolerate OOB in wc for at least 1 hr at a time  Skilled Therapeutic Interventions/Progress Updates: Pt presents supine in bed and reluctantly agreeable to therapy  Spouse present and able to assist as needed.  Pt required mod A for log roll to right, although cueing for safety and speed and sequencing so rolls before attempting to transfer to sitting.  Pt sat EOB w/o UE support and then w/ UE support, but no LOB forward or back.  Pt performed [place] transfer sit to stand w/ assist of 2 mod A and blocking knees and then step-pivot to TIS.  Pt performed C/spine ROM and maintaining position away from chair back for RN to change dressing to incision.  Pt able to lift LEs for placing leg rests.  Pt wheeled to gym.  Head rest re-positioned/adjusted for head support when tilted.  Pt performed same transfer w/ assist of 2 to mat table.  Pt sitting w/ increased agitation and decreased trunk control.  Pt states " I'm starting to get bad now."  Attempted same assist but unable to complete sit to stand w/o maintaining blocking of knees.  Pt performed SB transfer w/ max A of 2 and then re-positioning.  Pt performed reaching forward w/ BUEs, pulling forward on PT.  Pt starting to c/o pain especially of groin.  Pt handed off to PT for next session of rx.     Therapy Documentation Precautions:  Precautions Precautions: Fall,Back Restrictions Weight Bearing Restrictions: No Other  Position/Activity Restrictions: soinal General:   Vital Signs: Therapy Vitals Temp: 99.4 F (37.4 C) Temp Source: Oral Pulse Rate: (!) 102 Resp: 18 BP: (!) 104/54 Patient Position (if appropriate): Lying Oxygen Therapy SpO2: 97 % O2 Device: Room Air Pain: at initiation, c/o B hand pain, but unable to quantify. Pain Assessment Pain Scale: Faces Faces Pain Scale: Hurts little more Pain Type: Acute pain Pain Location: Back Mobility:   :      Therapy/Group: Individual Therapy  Ladoris Gene 07/03/2020, 2:03 PM

## 2020-07-03 NOTE — Progress Notes (Signed)
Pt refusing tray stating "I can not feed myself"

## 2020-07-04 DIAGNOSIS — D638 Anemia in other chronic diseases classified elsewhere: Secondary | ICD-10-CM | POA: Diagnosis not present

## 2020-07-04 DIAGNOSIS — G9340 Encephalopathy, unspecified: Secondary | ICD-10-CM | POA: Diagnosis not present

## 2020-07-04 DIAGNOSIS — G061 Intraspinal abscess and granuloma: Secondary | ICD-10-CM | POA: Diagnosis not present

## 2020-07-04 DIAGNOSIS — G001 Pneumococcal meningitis: Secondary | ICD-10-CM

## 2020-07-04 DIAGNOSIS — R339 Retention of urine, unspecified: Secondary | ICD-10-CM

## 2020-07-04 DIAGNOSIS — I10 Essential (primary) hypertension: Secondary | ICD-10-CM | POA: Diagnosis not present

## 2020-07-04 LAB — GLUCOSE, CAPILLARY
Glucose-Capillary: 74 mg/dL (ref 70–99)
Glucose-Capillary: 162 mg/dL — ABNORMAL HIGH (ref 70–99)
Glucose-Capillary: 89 mg/dL (ref 70–99)
Glucose-Capillary: 165 mg/dL — ABNORMAL HIGH (ref 70–99)

## 2020-07-04 LAB — BASIC METABOLIC PANEL
Anion gap: 7 (ref 5–15)
BUN: 6 mg/dL (ref 6–20)
CO2: 26 mmol/L (ref 22–32)
Calcium: 8 mg/dL — ABNORMAL LOW (ref 8.9–10.3)
Chloride: 105 mmol/L (ref 98–111)
Creatinine, Ser: 0.59 mg/dL (ref 0.44–1.00)
GFR, Estimated: >60 mL/min (ref >60)
Glucose, Bld: 78 mg/dL (ref 70–99)
Potassium: 3.3 mmol/L — ABNORMAL LOW (ref 3.5–5.1)
Sodium: 138 mmol/L (ref 135–145)

## 2020-07-04 MED ORDER — LIDOCAINE HCL URETHRAL/MUCOSAL 2 % EX GEL
1.0000 "application " | Freq: Four times a day (QID) | CUTANEOUS | Status: AC | PRN
  Administered 2020-07-04: 1 via URETHRAL
  Filled 2020-07-04: qty 5

## 2020-07-04 MED ORDER — SENNOSIDES-DOCUSATE SODIUM 8.6-50 MG PO TABS
2.0000 | ORAL_TABLET | Freq: Every day | ORAL | Status: DC
  Administered 2020-07-04 – 2020-07-12 (×8): 2 via ORAL
  Filled 2020-07-04 (×10): qty 2

## 2020-07-04 MED ORDER — POTASSIUM CHLORIDE CRYS ER 20 MEQ PO TBCR
30.0000 meq | EXTENDED_RELEASE_TABLET | Freq: Two times a day (BID) | ORAL | Status: AC
  Administered 2020-07-04 – 2020-07-05 (×3): 30 meq via ORAL
  Filled 2020-07-04 (×3): qty 1

## 2020-07-04 MED ORDER — TAMSULOSIN HCL 0.4 MG PO CAPS
0.8000 mg | ORAL_CAPSULE | Freq: Every day | ORAL | Status: DC
  Administered 2020-07-05 – 2020-07-31 (×27): 0.8 mg via ORAL
  Filled 2020-07-04 (×28): qty 2

## 2020-07-04 NOTE — Progress Notes (Signed)
Occupational Therapy Session Note  Patient Details  Name: Tonya Myers MRN: [id number] Date of Birth: 05-29-1965  Today's Date: 07/04/2020 OT Individual Time: 0700-0800 OT Individual Time Calculation (min): 60 min    Short Term Goals: Week 1:  OT Short Term Goal 1 (Week 1): Pt will maintain sitting balance EOB with Mod A for ~5 minutes during self care activity OT Short Term Goal 2 (Week 1): Pt will complete 1 grooming task w/c level at the sink with no more than Mod A and mod cues OT Short Term Goal 3 (Week 1): Pt will remain OOB for ~45 minutes while engaging in ADL activity  Skilled Therapeutic Interventions/Progress Updates:    1;1. Pt received in bed requiring mod-max encouragement to participate to day as pt believes wholeheartedly she is "going to go today" and "I just need to hold on till [name] gets here." Pt eats breakfast with encouragement to self feed with cuing fo rpacing and A to cut up food. Pt completes dressing with total A for LB dressing and MIN A to roll in B directions for CM. Pt dons shirt with A to pull down back rolling in B directions as pt initially declines sitting EOB. After break and encouragement pt sits EOB with TC for LE management, VC for log rolling and A toelevate trunk over 2 trial with Sup rest break. Pt completes 1 STS at EOB with MOD A of 2 and tactile cues at L knee for extension susing 3 musketeers technique. Pt grooms with VC for sequencing. Exited session with pt seated in bed, exit alarm on and call light in reach   Therapy Documentation Precautions:  Precautions Precautions: Fall,Back Restrictions Weight Bearing Restrictions: No Other Position/Activity Restrictions: soinal General:   Vital Signs: Therapy Vitals Temp: 98.4 F (36.9 C) Pulse Rate: 89 Resp: 17 BP: (!) 147/79 Patient Position (if appropriate): Lying Oxygen Therapy SpO2: 100 % Pain: Pain Assessment Pain Scale: 0-10 Pain Score: Asleep Pain Type: Acute pain Pain  Location: Head Pain Intervention(s): Medication (See eMAR) ADL: ADL ADL Comments: Unable to assess any ADL areas due to pts unwillingness to participate/agitation and aggressive behaviors Vision   Perception    Praxis   Exercises:   Other Treatments:     Therapy/Group: Individual Therapy  [name] 07/04/2020, 6:41 AM

## 2020-07-04 NOTE — Progress Notes (Signed)
PROGRESS NOTE   Subjective/Complaints: Patient seen sitting up in bed this morning, and later sitting up in her chair working with therapies.  She states she slept well overnight.  Patient perseverative on feeling like she is going to imminently pass away, attempted to reassure patient, able to be redirected at times.  Patient confused.  Later seen with husband and discussed similar issues-husband understanding.  ROS:?  Reliability, however appears to deny CP, shortness of breath, nausea, vomiting, diarrhea.  Objective:   No results found. No results for input(s): WBC, HGB, HCT, PLT in the last 72 hours. Recent Labs    07/04/20 0526  NA 138  K 3.3*  CL 105  CO2 26  GLUCOSE 78  BUN 6  CREATININE 0.59  CALCIUM 8.0*    Intake/Output Summary (Last 24 hours) at 07/04/2020 1037 Last data filed at 07/04/2020 0316 Gross per 24 hour  Intake 480 ml  Output 1450 ml  Net -970 ml        Physical Exam: Vital Signs Blood pressure (!) 147/79, pulse 89, temperature 98.4 F (36.9 C), resp. rate 17, height 6' (1.829 m), weight 99.7 kg, SpO2 100 %. Constitutional: No distress . Vital signs reviewed. HENT: Normocephalic.  Atraumatic. Eyes: EOMI. No discharge. Cardiovascular: No JVD.  Tachycardia.  Regular rhythm. Respiratory: Normal effort.  No stridor.  Bilateral clear to auscultation. GI: Non-distended.  BS +. Skin: Warm and dry.  Intact. Psych: Flat.  Slowed.  Perseverative. Musc: No edema in extremities.  No tenderness in extremities. Neurologic: Alert and oriented x3 Motor: Bilateral upper extremities: 4/5 proximal distal Bilateral lower extremities: Hip flexion 3/5, distally 4/5, stable Bilateral upper extremity ataxia  Assessment/Plan: 1. Functional deficits which require 3+ hours per day of interdisciplinary therapy in a comprehensive inpatient rehab setting.  Physiatrist is providing close team supervision and 24 hour  management of active medical problems listed below.  Physiatrist and rehab team continue to assess barriers to discharge/monitor patient progress toward functional and medical goals  Care Tool:  Bathing  Bathing activity did not occur: Refused           Bathing assist       Upper Body Dressing/Undressing Upper body dressing Upper body dressing/undressing activity did not occur (including orthotics): Refused What is the patient wearing?: Pull over shirt    Upper body assist Assist Level: Maximal Assistance - Patient 25 - 49%    Lower Body Dressing/Undressing Lower body dressing    Lower body dressing activity did not occur: Refused What is the patient wearing?: Incontinence brief,Pants     Lower body assist Assist for lower body dressing: Maximal Assistance - Patient 25 - 49%     Toileting Toileting Toileting Activity did not occur (Clothing management and hygiene only): Refused  Toileting assist Assist for toileting: Maximal Assistance - Patient 25 - 49%     Transfers Chair/bed transfer  Transfers assist     Chair/bed transfer assist level: 2 Helpers     Locomotion Ambulation   Ambulation assist      Assist level: 2 helpers Assistive device: Walker-[place]va Max distance: 62ft   Walk 10 feet activity   Assist  Walk 10 feet  activity did not occur: Safety/medical concerns        Walk 50 feet activity   Assist Walk 50 feet with 2 turns activity did not occur: Safety/medical concerns         Walk 150 feet activity   Assist Walk 150 feet activity did not occur: Safety/medical concerns         Walk 10 feet on uneven surface  activity   Assist Walk 10 feet on uneven surfaces activity did not occur: Safety/medical concerns         Wheelchair     Assist Will patient use wheelchair at discharge?: Yes Type of Wheelchair: Manual Wheelchair activity did not occur: Safety/medical concerns         Wheelchair 50 feet with 2 turns  activity    Assist    Wheelchair 50 feet with 2 turns activity did not occur: Safety/medical concerns       Wheelchair 150 feet activity     Assist  Wheelchair 150 feet activity did not occur: Safety/medical concerns       Medical Problem List and Plan: 1.  Decreased functional ability secondary to acute encephalopathy.  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    Continue [name]   Prolonged discussion with patient and husband regarding functional status, progress, interventions, medications. 2.  Antithrombotics: -DVT/anticoagulation: Continue Lovenox 52.5 mg daily.              -antiplatelet therapy: N/A 3. Pain Management: Oxycodone as needed  Appears controlled on 3/11 4. Mood: Team support             -antipsychotic agents: N/A 5. Neuropsych: This patient is not capable of making decisions on her own behalf. 6. Skin/Wound Care: Routine skin checks 7. Fluids/Electrolytes/Nutrition: Routine in and outs 8.  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  Continue IV Rocephin through 07/23/2020 and follow-up infectious disease. 9.  Diabetes mellitus with hyperglycemia/DKA.  Hemoglobin A1c 8.0.  NovoLog 5 units 3 times daily, Lantus insulin 45 units daily.  Check blood sugars before meals and at bedtime CBG (last 3)  Recent Labs    07/03/20 1632 07/03/20 2103 07/04/20 0607  GLUCAP 89 73 74   Stabilizing on 3/11 10.  CLL.  Follow-up outpatient Dr. Delight Hoh 11.  Hypothyroidism.  Synthroid 12.  Anemia of chronic disease.  Multifactorial.   Hemoglobin 7.9 on 3/7, labs ordered for Monday  Continue to monitor 13.  Hypertension.  Dilacor120 mg daily,Micardis 80-25 mg daily           Vitals:   07/03/20 1947 07/04/20 0310  BP:  126/77 (!) 147/79  Pulse: 93 89  Resp: 18 17  Temp: 98.3 F (36.8 C) 98.4 F (36.9 C)  SpO2: 100% 100%   Relatively controlled on 3/11 14.  Transaminitis.    LFTs elevated on 3/7, labs ordered for Monday 15.  Obesity.  BMI 31.19.  Dietary follow-up 16.  History of asthma.  Continue Singulair 10 mg nightly.   17.  History of genital herpes.  Conitnue Valtrex 18.  Sleep disturbance  Melatonin  Appears to be improving overall 19.  Slow transit constipation  Bowel meds increased on 3/9, increased again on 3/11 20.  Hypokalemia  Potassium 3.3 on 3/11, labs ordered for Monday  Supplemented x2 days on 3/11 21.  Urinary retention  Flomax increased on 3/11  Continue I/O caths as necessary  > 35 minutes spent in total with patient and husband discussing and attempting to alleviate concerns regarding imminent death, infection, function, progress, electrolytes, etc.  LOS: 7 days A FACE TO FACE EVALUATION WAS PERFORMED  Ankit Lorie Phenix 07/04/2020, 10:37 AM

## 2020-07-04 NOTE — Progress Notes (Signed)
Speech Language Pathology Daily Session Note  Patient Details  Name: Tonya Myers MRN: [id number] Date of Birth: 07/19/1965  Today's Date: 07/04/2020 SLP Individual Time: 1300-1345 SLP Individual Time Calculation (min): 45 min  Short Term Goals: Week 1: SLP Short Term Goal 1 (Week 1): Pt will demonstrate selective attention in 15 minute intervals with min A verbal cues for redirection. SLP Short Term Goal 2 (Week 1): Pt will demonstrate mildly complex problem solving skills with supervision A verbal cues. SLP Short Term Goal 3 (Week 1): Pt will self-monitor and self-correct functional and verbal errors with supervision A verbal cues. SLP Short Term Goal 4 (Week 1): Pt will demonstrate recall of daily and novel information with aids given max A verbal cues. SLP Short Term Goal 5 (Week 1): Pt will demonstrate higher level word finding skills in structured and unstructured tasks with min A semantic cues.  Skilled Therapeutic Interventions:   Patient seen with husband in room for skilled ST session focusing on cognitive-linguistic goals. Patient completed object drawing naming task and was correct without cues for 12/15 and 15/15 with 4 choice word cue. She exhibited intermittent frequency of phonemic errors but demonstrated awareness. She was 60% accurate on reading instructions and answering questions with 3-choices without cues but improved to 90% with modA cues. She answered multiple choice comprehension questions after reading short story (paragraph level) with 90% accuracy but when questions were open-ended, she was 70% accurate. Throughout session, patient would let SLP know when she was feeling anxious, etc and was able to effectively verbalize her needs. Her husband [name] was pleased that today she was able to accurately describe her language deficits as he said she was not able to do that previously. Patient continues to benefit from skilled SLP intervention to maximize  cognitive-linguistic functioning prior to discharge.  Pain Pain Assessment Pain Scale: 0-10 Pain Score: 0-No pain Faces Pain Scale: Hurts little more Pain Type: Acute pain;Neuropathic pain Pain Location: Hand Pain Orientation: Right;Left Pain Descriptors / Indicators: Discomfort Pain Onset: On-going Pain Intervention(s): Distraction  Therapy/Group: Individual Therapy  [name], MA, CCC-SLP Speech Therapy

## 2020-07-04 NOTE — Progress Notes (Signed)
Physical Therapy Session Note  Patient Details  Name: Tonya Myers MRN: [id number] Date of Birth: Aug 31, 1965  Today's Date: 07/04/2020 PT Individual Time: 1007-1110 PT Individual Time Calculation (min): 63 min   Short Term Goals: Week 1:  PT Short Term Goal 1 (Week 1): Pt will perform supine to sit w/mod assist of 1 PT Short Term Goal 2 (Week 1): Pt will perform sit to stand w/mod assist of 2 consistently PT Short Term Goal 3 (Week 1): Pt will transfer bed to/from wc w/mod assist of 2 PT Short Term Goal 4 (Week 1): Pt will tolerate OOB in wc for at least 1 hr at a time  Skilled Therapeutic Interventions/Progress Updates:    Patient in supine and reports multiple complaints of hand pain, frequent very small BM's and having been overworked in therapy.  Spouse in the room.  Performed rolling to L with mod A for L LE flexion and mod to max cues for technique and side to sit with mod A to lift trunk.  Patient recalled back precautions with min cues.  Patient sit to stand +2 A from EOB max A and step to w/c.  Repositioned in w/c and MD in room to address some of pt's concerns.  Patient pushed in w/c to therapy gym.  Performed sit to stand to EVA walker x 3 each time upon attempting to straighten hips knees giving away and lowering to chair.  Prior to third attempt had donned walking sling for maxisky and hooked to lift, but when pt buckled c/o vest choking her.  Discussed issues related to her buckling and agreed to work on LE strengthening.  Patient squat pivot to mat with +2 max A.  Patient sit to supine max A for positioning trunk and legs.  Performed 2 x 10 reps bridging with A for LE stability, second set with ball between knees and cues for at least 3 second hold.  Performed SAQ x 10 each leg with 3 sec hold.  Patient supine to sit through L sidelying with mod A +2 for safety.  Transferred to w/c max A +2 squat pivot.  Assisted to room and pt requesting back to bed.  Spouse assisted with slide  board transfer +2 mod A.  Sit to supine max A with increased time for trunk positioning.  Left with spouse in the room, bed alarm on and staff aware pt requesting help for clothing.    Therapy Documentation Precautions:  Precautions Precautions: Fall,Back Restrictions Weight Bearing Restrictions: No Other Position/Activity Restrictions: soinal Pain: Pain Assessment Faces Pain Scale: Hurts little more Pain Type: Acute pain;Neuropathic pain Pain Location: Hand Pain Orientation: Right;Left Pain Descriptors / Indicators: Discomfort Pain Onset: On-going Pain Intervention(s): Distraction     Therapy/Group: Individual Therapy  Reginia Naas  Magda Kiel, PT 07/04/2020, 11:14 AM

## 2020-07-05 DIAGNOSIS — G061 Intraspinal abscess and granuloma: Secondary | ICD-10-CM | POA: Diagnosis not present

## 2020-07-05 DIAGNOSIS — D638 Anemia in other chronic diseases classified elsewhere: Secondary | ICD-10-CM | POA: Diagnosis not present

## 2020-07-05 DIAGNOSIS — G9340 Encephalopathy, unspecified: Secondary | ICD-10-CM | POA: Diagnosis not present

## 2020-07-05 DIAGNOSIS — G039 Meningitis, unspecified: Secondary | ICD-10-CM | POA: Diagnosis not present

## 2020-07-05 LAB — CBC WITH DIFFERENTIAL/PLATELET
Abs Immature Granulocytes: 0.06 10*3/uL (ref 0.00–0.07)
Basophils Absolute: 0.1 10*3/uL (ref 0.0–0.1)
Basophils Relative: 1 %
Eosinophils Absolute: 0 10*3/uL (ref 0.0–0.5)
Eosinophils Relative: 0 %
HCT: 26.4 % — ABNORMAL LOW (ref 36.0–46.0)
Hemoglobin: 7.8 g/dL — ABNORMAL LOW (ref 12.0–15.0)
Immature Granulocytes: 1 %
Lymphocytes Relative: 25 %
Lymphs Abs: 2 10*3/uL (ref 0.7–4.0)
MCH: 24.8 pg — ABNORMAL LOW (ref 26.0–34.0)
MCHC: 29.5 g/dL — ABNORMAL LOW (ref 30.0–36.0)
MCV: 83.8 fL (ref 80.0–100.0)
Monocytes Absolute: 0.6 10*3/uL (ref 0.1–1.0)
Monocytes Relative: 7 %
Neutro Abs: 5.4 10*3/uL (ref 1.7–7.7)
Neutrophils Relative %: 66 %
Platelets: 436 10*3/uL — ABNORMAL HIGH (ref 150–400)
RBC: 3.15 MIL/uL — ABNORMAL LOW (ref 3.87–5.11)
RDW: 17.6 % — ABNORMAL HIGH (ref 11.5–15.5)
WBC: 8.1 10*3/uL (ref 4.0–10.5)
nRBC: 0 % (ref 0.0–0.2)

## 2020-07-05 LAB — GLUCOSE, CAPILLARY
Glucose-Capillary: 113 mg/dL — ABNORMAL HIGH (ref 70–99)
Glucose-Capillary: 190 mg/dL — ABNORMAL HIGH (ref 70–99)
Glucose-Capillary: 116 mg/dL — ABNORMAL HIGH (ref 70–99)
Glucose-Capillary: 144 mg/dL — ABNORMAL HIGH (ref 70–99)

## 2020-07-05 MED ORDER — METHOCARBAMOL 500 MG PO TABS
500.0000 mg | ORAL_TABLET | Freq: Three times a day (TID) | ORAL | Status: DC
  Administered 2020-07-05 – 2020-07-31 (×74): 500 mg via ORAL
  Filled 2020-07-05 (×77): qty 1

## 2020-07-05 MED ORDER — TRAZODONE HCL 50 MG PO TABS
25.0000 mg | ORAL_TABLET | Freq: Every day | ORAL | Status: DC
  Administered 2020-07-06 – 2020-07-25 (×20): 25 mg via ORAL
  Filled 2020-07-05 (×22): qty 1

## 2020-07-05 NOTE — Progress Notes (Signed)
Speech Language Pathology Daily Session Note  Patient Details  Name: Virtie Bungert MRN: [id number] Date of Birth: 1965-11-24  Today's Date: 07/05/2020 SLP Individual Time: 1505-1530 SLP Individual Time Calculation (min): 25 min  Short Term Goals: Week 1: SLP Short Term Goal 1 (Week 1): Pt will demonstrate selective attention in 15 minute intervals with min A verbal cues for redirection. SLP Short Term Goal 2 (Week 1): Pt will demonstrate mildly complex problem solving skills with supervision A verbal cues. SLP Short Term Goal 3 (Week 1): Pt will self-monitor and self-correct functional and verbal errors with supervision A verbal cues. SLP Short Term Goal 4 (Week 1): Pt will demonstrate recall of daily and novel information with aids given max A verbal cues. SLP Short Term Goal 5 (Week 1): Pt will demonstrate higher level word finding skills in structured and unstructured tasks with min A semantic cues.  Skilled Therapeutic Interventions:   Patient seen to address cognitive-linguistic goals. She was pleasant, very receptive to working and actually seemed more calm, smiling and joking around a little. She participated in mild complex attention task of locating items in calorie counting booklet. She was not able to independently find items using category headers but when SLP turned to correct pages, she was then able to locate items. She required modA cues for redirection such as when starting to look for another item before locating the one she had been searching for. Patient would frequently comment that the task was difficult, but she did not give up or appear to get frustrated. She was able to complete task fully with SLP cues/assist. Patient continues to benefit from skilled SLP intervention to maximize cognitive-linguistic goals prior to discharge.  Pain Pain Assessment Pain Scale: 0-10 Pain Score: 0-No pain  Therapy/Group: Individual Therapy  [name], MA, CCC-SLP Speech  Therapy

## 2020-07-05 NOTE — Progress Notes (Signed)
Bladder scan showing only 91 mls of urine, will continue to monitor.

## 2020-07-05 NOTE — Progress Notes (Signed)
Occupational Therapy Session Note  Patient Details  Name: Tonya Myers MRN: [id number] Date of Birth: 12/13/1965  Today's Date: 07/05/2020 OT Individual Time: 0845-0954 OT Individual Time Calculation (min): 69 min    Short Term Goals: Week 1:  OT Short Term Goal 1 (Week 1): Pt will maintain sitting balance EOB with Mod A for ~5 minutes during self care activity OT Short Term Goal 2 (Week 1): Pt will complete 1 grooming task w/c level at the sink with no more than Mod A and mod cues OT Short Term Goal 3 (Week 1): Pt will remain OOB for ~45 minutes while engaging in ADL activity  Skilled Therapeutic Interventions/Progress Updates:     Pt received in bed agreeable to OT, still confused reporting "im circling the drain" but able ot be redirected to ADL tasks.  ADL:  Pt completes bathing with seated EOB with S for sitting blance and HOH A to wash BUE to grasp wash cloth. Pt washes chest/stomach and arpits with VC for sequencing as well as B thighs. OT washes BLE. Pt delines peri/buttock hygiene Pt completes UB dressing with A to thread head and pull down back Pt completes LB dressing with A to thread over feet and pt able to pull from knees to thighs. MOD A +2 3 musketeers to stand and sdvance pants past hips Pt completes footwear with total A to don non skid socks Pt completes grooming with S for sequencing and VC for slowing FMC open/close of toothpaste Extended rest after ADLs but pt agreeable to work on FMC   Therapeutic activity Pt provided medications by RN. Pt unable to manage picking up pills 1 at a time. Pt given applesauce and scoops applesauce with RUE and OT places meds onto applesauce (RN in room while hooking up IV). Pt provided with foam cubes and able to pick up with either UE and place in a cup. Pt unable to complete box and blocks with RUE, but able to get 12 with LUE.  Pt left at end of session in bed with exit alarm on, call light in reach and all needs  met   Therapy Documentation Precautions:  Precautions Precautions: Fall,Back Restrictions Weight Bearing Restrictions: No Other Position/Activity Restrictions: soinal General:   Vital Signs: Therapy Vitals Temp: 98.4 F (36.9 C) Pulse Rate: (!) 101 Resp: 20 BP: 126/81 Patient Position (if appropriate): Lying Oxygen Therapy SpO2: 98 % O2 Device: Room Air Pain: Pain Assessment Pain Scale: 0-10 Pain Score: 7  Pain Type: Acute pain Pain Location: Back Pain Orientation: Mid Pain Descriptors / Indicators: Aching Pain Intervention(s): Medication (See eMAR) ADL: ADL ADL Comments: Unable to assess any ADL areas due to pts unwillingness to participate/agitation and aggressive behaviors Vision   Perception    Praxis   Exercises:   Other Treatments:     Therapy/Group: Individual Therapy   M  07/05/2020, 6:50 AM 

## 2020-07-05 NOTE — Progress Notes (Signed)
Bladder scan showing >350 mls urine, straight cathed per order and drained 600 mls clear yellow urine. Tolerated well.

## 2020-07-05 NOTE — Progress Notes (Signed)
Patient incontinent of bowel and bladder. Bladder scanned at 6am for 10cc. Will monitor.

## 2020-07-05 NOTE — Progress Notes (Signed)
PROGRESS NOTE   Subjective/Complaints: Patient seen laying in bed this morning.  She states she slept well overnight, however per nursing, patient did not sleep at all overnight.  She states that she was able to have a bowel movement and that she voided on her own.  She is more calm this morning.  ROS:?  Reliability, however appears to deny CP, shortness of breath, nausea, vomiting, diarrhea.  Objective:   No results found. No results for input(s): WBC, HGB, HCT, PLT in the last 72 hours. Recent Labs    07/04/20 0526  NA 138  K 3.3*  CL 105  CO2 26  GLUCOSE 78  BUN 6  CREATININE 0.59  CALCIUM 8.0*    Intake/Output Summary (Last 24 hours) at 07/05/2020 1253 Last data filed at 07/05/2020 0000 Gross per 24 hour  Intake --  Output 1200 ml  Net -1200 ml        Physical Exam: Vital Signs Blood pressure 126/81, pulse (!) 101, temperature 98.4 F (36.9 C), resp. rate 20, height 6' (1.829 m), weight 99.7 kg, SpO2 98 %. Constitutional: No distress . Vital signs reviewed. HENT: Normocephalic.  Atraumatic. Eyes: EOMI. No discharge. Cardiovascular: No JVD.  RRR. Respiratory: Normal effort.  No stridor.  Bilateral clear to auscultation. GI: Non-distended.  BS +. Skin: Warm and dry.  Intact. Psych: Flat.  Slowed. Musc: No edema in extremities.  No tenderness in extremities. Neurologic: Alert Motor: Bilateral upper extremities: 4/5 proximal distal, stable Bilateral lower extremities: Hip flexion 3/5, distally 4/5, stable Bilateral upper extremity ataxia, stable  Assessment/Plan: 1. Functional deficits which require 3+ hours per day of interdisciplinary therapy in a comprehensive inpatient rehab setting.  Physiatrist is providing close team supervision and 24 hour management of active medical problems listed below.  Physiatrist and rehab team continue to assess barriers to discharge/monitor patient progress toward  functional and medical goals  Care Tool:  Bathing  Bathing activity did not occur: Refused           Bathing assist       Upper Body Dressing/Undressing Upper body dressing Upper body dressing/undressing activity did not occur (including orthotics): Refused What is the patient wearing?: Pull over shirt    Upper body assist Assist Level: Maximal Assistance - Patient 25 - 49%    Lower Body Dressing/Undressing Lower body dressing    Lower body dressing activity did not occur: Refused What is the patient wearing?: Incontinence brief,Pants     Lower body assist Assist for lower body dressing: Maximal Assistance - Patient 25 - 49%     Toileting Toileting Toileting Activity did not occur Landscape architect and hygiene only): Refused  Toileting assist Assist for toileting: Maximal Assistance - Patient 25 - 49%     Transfers Chair/bed transfer  Transfers assist     Chair/bed transfer assist level: 2 Helpers     Locomotion Ambulation   Ambulation assist      Assist level: 2 helpers Assistive device: Walker-Eva Max distance: 24ft   Walk 10 feet activity   Assist  Walk 10 feet activity did not occur: Safety/medical concerns        Walk 50 feet activity  Assist Walk 50 feet with 2 turns activity did not occur: Safety/medical concerns         Walk 150 feet activity   Assist Walk 150 feet activity did not occur: Safety/medical concerns         Walk 10 feet on uneven surface  activity   Assist Walk 10 feet on uneven surfaces activity did not occur: Safety/medical concerns         Wheelchair     Assist Will patient use wheelchair at discharge?: Yes Type of Wheelchair: Manual Wheelchair activity did not occur: Safety/medical concerns         Wheelchair 50 feet with 2 turns activity    Assist    Wheelchair 50 feet with 2 turns activity did not occur: Safety/medical concerns       Wheelchair 150 feet activity      Assist  Wheelchair 150 feet activity did not occur: Safety/medical concerns       Medical Problem List and Plan: 1.  Decreased functional ability secondary to acute encephalopathy.  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    Continue [name]  2.  Antithrombotics: -DVT/anticoagulation: Continue Lovenox 52.5 mg daily.              -antiplatelet therapy: N/A 3. Pain Management: Oxycodone as needed  Robaxin 500 3 times daily scheduled on 3/12 4. Mood: Team support             -antipsychotic agents: N/A 5. Neuropsych: This patient is not capable of making decisions on her own behalf. 6. Skin/Wound Care: Routine skin checks 7. Fluids/Electrolytes/Nutrition: Routine in and outs 8.  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  Continue IV Rocephin through 07/23/2020 and follow-up infectious disease. 9.  Diabetes mellitus with hyperglycemia/DKA.  Hemoglobin A1c 8.0.  NovoLog 5 units 3 times daily, Lantus insulin 45 units daily.  Check blood sugars before meals and at bedtime CBG (last 3)  Recent Labs    07/04/20 2108 07/05/20 0628 07/05/20 1136  GLUCAP 165* 113* 190*   Slightly labile on 3/12, monitor trend 10.  CLL.  Follow-up outpatient Dr. Delight Hoh 11.  Hypothyroidism.  Synthroid 12.  Anemia of chronic disease.  Multifactorial.   Hemoglobin 7.9 on 3/7, labs ordered for Monday  Continue to monitor 13.  Hypertension.  Dilacor120 mg daily,Micardis 80-25 mg daily           Vitals:   07/04/20 2008 07/05/20 0358  BP: 126/71 126/81  Pulse: 95 (!) 101  Resp: 20 20  Temp: 98 F (36.7 C) 98.4 F (36.9 C)  SpO2: 97% 98%   Relatively controlled on 3/12 14.  Transaminitis.    LFTs elevated on 3/7, labs ordered for Monday 15.  Obesity.   BMI 31.19.  Dietary follow-up 16.  History of asthma.  Continue Singulair 10 mg nightly.   17.  History of genital herpes.  Conitnue Valtrex 18.  Sleep disturbance  Melatonin  ECG reviewed, trazodone 25 nightly started on 3/12 19.  Slow transit constipation  Bowel meds increased on 3/9, increased again on 3/11  Improving 20.  Hypokalemia  Potassium 3.3 on 3/11, labs ordered for Monday  Supplemented x2 days on 3/11 21.  Urinary retention  Flomax increased on 3/11  Continue I/O caths as necessary  Improving  LOS: 8 days A FACE TO FACE  EVALUATION WAS PERFORMED  Aleene Swanner Lorie Phenix 07/05/2020, 12:53 PM

## 2020-07-06 LAB — GLUCOSE, CAPILLARY
Glucose-Capillary: 84 mg/dL (ref 70–99)
Glucose-Capillary: 196 mg/dL — ABNORMAL HIGH (ref 70–99)
Glucose-Capillary: 108 mg/dL — ABNORMAL HIGH (ref 70–99)
Glucose-Capillary: 148 mg/dL — ABNORMAL HIGH (ref 70–99)

## 2020-07-06 NOTE — Progress Notes (Signed)
Pts IV infiltrated. IV consulted per pt request. Pt did not want this staff member to start a line.

## 2020-07-07 DIAGNOSIS — G9340 Encephalopathy, unspecified: Secondary | ICD-10-CM | POA: Diagnosis not present

## 2020-07-07 LAB — CBC WITH DIFFERENTIAL/PLATELET
Abs Immature Granulocytes: 0.03 10*3/uL (ref 0.00–0.07)
Basophils Absolute: 0.1 10*3/uL (ref 0.0–0.1)
Basophils Relative: 1 %
Eosinophils Absolute: 0 10*3/uL (ref 0.0–0.5)
Eosinophils Relative: 0 %
HCT: 25.4 % — ABNORMAL LOW (ref 36.0–46.0)
Hemoglobin: 7.6 g/dL — ABNORMAL LOW (ref 12.0–15.0)
Immature Granulocytes: 0 %
Lymphocytes Relative: 25 %
Lymphs Abs: 1.8 10*3/uL (ref 0.7–4.0)
MCH: 25 pg — ABNORMAL LOW (ref 26.0–34.0)
MCHC: 29.9 g/dL — ABNORMAL LOW (ref 30.0–36.0)
MCV: 83.6 fL (ref 80.0–100.0)
Monocytes Absolute: 0.5 10*3/uL (ref 0.1–1.0)
Monocytes Relative: 7 %
Neutro Abs: 4.9 10*3/uL (ref 1.7–7.7)
Neutrophils Relative %: 67 %
Platelets: 418 10*3/uL — ABNORMAL HIGH (ref 150–400)
RBC: 3.04 MIL/uL — ABNORMAL LOW (ref 3.87–5.11)
RDW: 17.7 % — ABNORMAL HIGH (ref 11.5–15.5)
WBC: 7.4 10*3/uL (ref 4.0–10.5)
nRBC: 0 % (ref 0.0–0.2)

## 2020-07-07 LAB — COMPREHENSIVE METABOLIC PANEL
ALT: 51 U/L — ABNORMAL HIGH (ref 0–44)
AST: 35 U/L (ref 15–41)
Albumin: 2.1 g/dL — ABNORMAL LOW (ref 3.5–5.0)
Alkaline Phosphatase: 168 U/L — ABNORMAL HIGH (ref 38–126)
Anion gap: 8 (ref 5–15)
BUN: 7 mg/dL (ref 6–20)
CO2: 24 mmol/L (ref 22–32)
Calcium: 8.3 mg/dL — ABNORMAL LOW (ref 8.9–10.3)
Chloride: 107 mmol/L (ref 98–111)
Creatinine, Ser: 0.75 mg/dL (ref 0.44–1.00)
GFR, Estimated: >60 mL/min (ref >60)
Glucose, Bld: 166 mg/dL — ABNORMAL HIGH (ref 70–99)
Potassium: 3.6 mmol/L (ref 3.5–5.1)
Sodium: 139 mmol/L (ref 135–145)
Total Bilirubin: 0.6 mg/dL (ref 0.3–1.2)
Total Protein: 5.4 g/dL — ABNORMAL LOW (ref 6.5–8.1)

## 2020-07-07 LAB — GLUCOSE, CAPILLARY
Glucose-Capillary: 143 mg/dL — ABNORMAL HIGH (ref 70–99)
Glucose-Capillary: 187 mg/dL — ABNORMAL HIGH (ref 70–99)
Glucose-Capillary: 122 mg/dL — ABNORMAL HIGH (ref 70–99)
Glucose-Capillary: 94 mg/dL (ref 70–99)

## 2020-07-07 MED ORDER — BLOOD PRESSURE CONTROL BOOK
Freq: Once | Status: AC
  Filled 2020-07-07: qty 1

## 2020-07-07 MED ORDER — TRAMADOL HCL 50 MG PO TABS
50.0000 mg | ORAL_TABLET | Freq: Four times a day (QID) | ORAL | Status: DC | PRN
  Administered 2020-07-07 – 2020-07-30 (×33): 50 mg via ORAL
  Filled 2020-07-07 (×38): qty 1

## 2020-07-07 MED ORDER — LIVING WELL WITH DIABETES BOOK
Freq: Once | Status: AC
  Filled 2020-07-07: qty 1

## 2020-07-07 NOTE — Progress Notes (Signed)
Physical Therapy Weekly Progress Note  Patient Details  Name: Tonya Myers MRN: [id number] Date of Birth: [birth year]  Beginning of progress report period: June 28, 2020 End of progress report period: July 07, 2020  Today's Date: 07/07/2020 PT Individual Time: 1330-1445 PT Individual Time Calculation (min): 75 min   Patient has met 4 of 4 short term goals.  Patient has improved over the past week able to perform slide board transfers today with +1 mod A including w/c set up and board placement.  She has been performing supine to side to sit with mod A and today was able to complete sit to supine with mod A of 1 as well.  And she was able to stand to EVA walker today from elevated height with mod A of 2 x 3 trials.  She continues to be distracted by toileting issues which can limit therapy and she fatigues quickly, but has significantly improved sitting balance and postural awareness as well.    Patient continues to demonstrate the following deficits muscle weakness, impaired timing and sequencing, ataxia, decreased coordination and decreased motor planning and decreased attention, decreased safety awareness and decreased memory and therefore will continue to benefit from skilled PT intervention to increase functional independence with mobility.  Patient progressing toward long term goals..  Continue plan of care.  PT Short Term Goals Week 1:  PT Short Term Goal 1 (Week 1): Pt will perform supine to sit w/mod assist of 1 PT Short Term Goal 1 - Progress (Week 1): Met PT Short Term Goal 2 (Week 1): Pt will perform sit to stand w/mod assist of 2 consistently PT Short Term Goal 2 - Progress (Week 1): Met PT Short Term Goal 3 (Week 1): Pt will transfer bed to/from wc w/mod assist of 2 PT Short Term Goal 3 - Progress (Week 1): Met PT Short Term Goal 4 (Week 1): Pt will tolerate OOB in wc for at least 1 hr at a time PT Short Term Goal 4 - Progress (Week 1): Met Week 2:  PT Short Term Goal 1  (Week 2): Patient to perform supine to sit min A. PT Short Term Goal 2 (Week 2): Patient to perform transfers bed<>chair min A. PT Short Term Goal 3 (Week 2): Patient to perform sit to stand with mod to max A of 1. PT Short Term Goal 4 (Week 2): Patient to ambulate with mod A +2 10'. PT Short Term Goal 5 (Week 2): Patient to perform w/c mobility 47' with S.  Skilled Therapeutic Interventions/Progress Updates:  Patient seen with spouse present.  She performed in bed therex consisting of ankle pumps, heel slides and hip abduction x 10 each.  She reported incontinent of bladder so performed rolling mod A and participated to initiate doffing pants, but completed by PT and assist for hygiene and brief change and donning new pants, pt performing bridging for completing donning pants in supine.  Rolled to R with min A and side to sit mod A to EOB.  Patient transferred to tilt in space w/c with mod A via slide board transfer.  She repositioned in chair with mod cues and mod A.  Pushed in w/c to therapy gym and spouse accompanied.  Transfer to mat via slide board with mod A.  Sit to stand from elevated height to EVA walker x 3 reps with +2 mod A with max cues for maintaining knee and hip extension in standing each trial for up to 30 seconds.  Seated rest and  leaning back on therapist in between trials.  Patient c/o fecal incontinence.  Slide board back to w/c and then to bed in room with mod A.  Sit to supine mod A for LE's.  Completed doffing pants, hygiene and brief change in supine rolling again with cues and mod A.  Patient scooted up to [facility] with bed in trendelenberg and cues for rail use mod A.  Patient left in supine with spouse in the room and call bell in reach, bed alarm activated.   Ambulation/gait training;Community reintegration;DME/adaptive equipment instruction;Neuromuscular re-education;Psychosocial support;Stair training;UE/LE Strength taining/ROM;Wheelchair propulsion/positioning;Balance/vestibular  training;Discharge planning;Pain management;Therapeutic Activities;UE/LE Coordination activities;Cognitive remediation/compensation;Functional mobility training;Patient/family education;Therapeutic Exercise;Visual/perceptual remediation/compensation   Therapy Documentation Precautions:  Precautions Precautions: Fall,Back Restrictions Weight Bearing Restrictions: No Other Position/Activity Restrictions: soinal Pain: Pain Assessment Pain Scale: 0-10 Pain Score: 3  Faces Pain Scale: Hurts little more Pain Type: Neuropathic pain Pain Location: Hand Pain Orientation: Left Pain Descriptors / Indicators: Tingling Pain Onset: On-going Pain Intervention(s): Distraction  Therapy/Group: Individual Therapy  Reginia Naas  [place], [place] 07/07/2020, 6:23 PM

## 2020-07-07 NOTE — Progress Notes (Addendum)
Speech Language Pathology Daily Session Note  Patient Details  Name: Mikyla Schachter MRN: [id number] Date of Birth: Jul 05, 1965  Today's Date: 07/07/2020 SLP Individual Time: 1450-1505, 8938 - 63  SLP Individual Time Calculation (min): 15 min and 28 min   Short Term Goals: Week 1: SLP Short Term Goal 1 (Week 1): Pt will demonstrate selective attention in 15 minute intervals with min A verbal cues for redirection. SLP Short Term Goal 2 (Week 1): Pt will demonstrate mildly complex problem solving skills with supervision A verbal cues. SLP Short Term Goal 3 (Week 1): Pt will self-monitor and self-correct functional and verbal errors with supervision A verbal cues. SLP Short Term Goal 4 (Week 1): Pt will demonstrate recall of daily and novel information with aids given max A verbal cues. SLP Short Term Goal 5 (Week 1): Pt will demonstrate higher level word finding skills in structured and unstructured tasks with min A semantic cues.  Skilled Therapeutic Interventions: #1 Skilled ST services focused on cogntive skills. Pt expressed lethargy due to medication but was agreeable to participate in ST services. SLP facilitated mildly complex problem solving tasks in organizing a closet with indirect and inferential information, pt initially required max A verbal cues fading to min A verbal cues. Pt requested to end service due to lethargy and need to rest. Pt missed 15 minutes of skilled ST service and ST will attempt to make up this time later in the day if able. Pt was left in room with family, call bell within reach and chair alarm set. SLP recommends to continue skilled services.  #2 Skilled ST services focused on cognitive skills. SLP facilitated mildly complex problem solving, selective attention and recall skills with similar organization task from AM session, pt required mod A verbal cues, with greater error awareness noted. Pt was left in room with call bell within reach and bed alarm set. SLP  recommends to continue skilled services.     Pain Pain Assessment Pain Score: 0-No pain  Therapy/Group: Individual Therapy  Shaquita Fort  [facility] 07/07/2020, 3:12 PM

## 2020-07-07 NOTE — Progress Notes (Signed)
Occupational Therapy Session Note  Patient Details  Name: Tonya Myers MRN: [id number] Date of Birth: 1966/01/31  Today's Date: 07/07/2020 OT Individual Time: 0900-1000 OT Individual Time Calculation (min): 60 min    Short Term Goals: Week 1:  OT Short Term Goal 1 (Week 1): Pt will maintain sitting balance EOB with Mod A for ~5 minutes during self care activity OT Short Term Goal 2 (Week 1): Pt will complete 1 grooming task w/c level at the sink with no more than Mod A and mod cues OT Short Term Goal 3 (Week 1): Pt will remain OOB for ~45 minutes while engaging in ADL activity  Skilled Therapeutic Interventions/Progress Updates:    Patient in bed, alert and cooperative this session.  Husband present for session.   Patient with pain in back relieved by change of position.  Able to roll in bed with min A.  Side lying to sitting edge of bed with mod A.  She is able to maintain unsupported sitting for 10 minutes at a time with adl tasks and trunk/posture activities - no LOB but CGA to maintain seated balance.  Doff/donn [place] shirt with min/mod A, donn pants max A, shoes max/dep.  Sit pivot transfer to/from drop arm commode and bed with mod A of one and stand by of 2nd, continent of bowel, max A for hygiene, placing clean brief and clothing management at bed level.  Sit to side lying to supine max A.  Returned to sitting with mod A.  Sit pivot to recliner with mod A of one and stand by of 2nd.  Ongoing cues for strategy and body position.  She remained seated in recliner at close of session, seat belt alarm set and call bell in reach.    Therapy Documentation Precautions:  Precautions Precautions: Fall,Back Restrictions Weight Bearing Restrictions: No Other Position/Activity Restrictions: soinal   Therapy/Group: Individual Therapy  Carlos Levering 07/07/2020, 7:50 AM

## 2020-07-07 NOTE — Progress Notes (Signed)
PROGRESS NOTE   Subjective/Complaints: Patient's chart reviewed- IV infiltrated and IV team has been consulted. No other issues reported over night Vitals signs stable  ROS:?  Reliability, however appears to deny CP, shortness of breath, nausea, vomiting, diarrhea.  Objective:   No results found. Recent Labs    07/05/20 1817 07/07/20 0509  WBC 8.1 7.4  HGB 7.8* 7.6*  HCT 26.4* 25.4*  PLT 436* 418*   Recent Labs    07/07/20 0509  NA 139  K 3.6  CL 107  CO2 24  GLUCOSE 166*  BUN 7  CREATININE 0.75  CALCIUM 8.3*    Intake/Output Summary (Last 24 hours) at 07/07/2020 1206 Last data filed at 07/07/2020 0809 Gross per 24 hour  Intake 960 ml  Output --  Net 960 ml        Physical Exam: Vital Signs Blood pressure 133/78, pulse 97, temperature 99.7 F (37.6 C), resp. rate 16, height 6' (1.829 m), weight 99.7 kg, SpO2 95 %. Gen: no distress, normal appearing HEENT: oral mucosa pink and moist, NCAT Cardio: Reg rate Chest: normal effort, normal rate of breathing Abd: soft, non-distended Ext: no edema Psych: Flat.  Slowed. Musc: No edema in extremities.  No tenderness in extremities. Neurologic: Alert Motor: Bilateral upper extremities: 4/5 proximal distal, stable Bilateral lower extremities: Hip flexion 3/5, distally 4/5, stable Bilateral upper extremity ataxia, stable  Assessment/Plan: 1. Functional deficits which require 3+ hours per day of interdisciplinary therapy in a comprehensive inpatient rehab setting.  Physiatrist is providing close team supervision and 24 hour management of active medical problems listed below.  Physiatrist and rehab team continue to assess barriers to discharge/monitor patient progress toward functional and medical goals  Care Tool:  Bathing  Bathing activity did not occur: Refused           Bathing assist       Upper Body Dressing/Undressing Upper body dressing  Upper body dressing/undressing activity did not occur (including orthotics): Refused What is the patient wearing?: Pull over shirt    Upper body assist Assist Level: Maximal Assistance - Patient 25 - 49%    Lower Body Dressing/Undressing Lower body dressing    Lower body dressing activity did not occur: Refused What is the patient wearing?: Incontinence brief,Pants     Lower body assist Assist for lower body dressing: Maximal Assistance - Patient 25 - 49%     Toileting Toileting Toileting Activity did not occur Landscape architect and hygiene only): Refused  Toileting assist Assist for toileting: Maximal Assistance - Patient 25 - 49%     Transfers Chair/bed transfer  Transfers assist     Chair/bed transfer assist level: 2 Helpers     Locomotion Ambulation   Ambulation assist      Assist level: 2 helpers Assistive device: Walker-Eva Max distance: 67ft   Walk 10 feet activity   Assist  Walk 10 feet activity did not occur: Safety/medical concerns        Walk 50 feet activity   Assist Walk 50 feet with 2 turns activity did not occur: Safety/medical concerns         Walk 150 feet activity   Assist Walk 150  feet activity did not occur: Safety/medical concerns         Walk 10 feet on uneven surface  activity   Assist Walk 10 feet on uneven surfaces activity did not occur: Safety/medical concerns         Wheelchair     Assist Will patient use wheelchair at discharge?: Yes Type of Wheelchair: Manual Wheelchair activity did not occur: Safety/medical concerns         Wheelchair 50 feet with 2 turns activity    Assist    Wheelchair 50 feet with 2 turns activity did not occur: Safety/medical concerns       Wheelchair 150 feet activity     Assist  Wheelchair 150 feet activity did not occur: Safety/medical concerns       Medical Problem List and Plan: 1.  Decreased functional ability secondary to acute  encephalopathy.  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    Continue [name]  2.  Antithrombotics: -DVT/anticoagulation: Continue Lovenox 52.5 mg daily.              -antiplatelet therapy: N/A 3. Pain Management: Oxycodone as needed  Robaxin 500 3 times daily scheduled on 3/12, continue 4. Mood: Team support             -antipsychotic agents: N/A 5. Neuropsych: This patient is not capable of making decisions on her own behalf. 6. Skin/Wound Care: Routine skin checks 7. Fluids/Electrolytes/Nutrition: Routine in and outs 8.  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  Continue IV Rocephin through 07/23/2020 and follow-up infectious disease. 9.  Diabetes mellitus with hyperglycemia/DKA.  Hemoglobin A1c 8.0.  NovoLog 5 units 3 times daily, Lantus insulin 45 units daily.  Check blood sugars before meals and at bedtime CBG (last 3)  Recent Labs    07/06/20 2119 07/07/20 0628 07/07/20 1134  GLUCAP 148* 143* 187*   Slightly labile on 3/14, monitor trend 10.  CLL.  Follow-up outpatient Dr. Delight Hoh 11.  Hypothyroidism.  Synthroid 12.  Anemia of chronic disease.  Multifactorial.   Hemoglobin 7.9 on 3/7, labs ordered for Monday  Continue to monitor 13.  Hypertension.  Dilacor120 mg daily,Micardis 80-25 mg daily           Vitals:   07/06/20 1926 07/07/20 0355  BP: (!) 132/97 133/78  Pulse: 95 97  Resp: 18 16  Temp: 98.3 F (36.8 C) 99.7 F (37.6 C)  SpO2: 96% 95%   Relatively controlled on 3/14 14.  Transaminitis.    LFTs elevated on 3/7, downtrending 3/14, repeat in 1 week. 15.  Obesity.  BMI 31.19.  Dietary follow-up 16.  History of asthma.  Continue Singulair 10 mg nightly.   17.  History of genital herpes.  Conitnue Valtrex 18.  Sleep  disturbance  Melatonin  ECG reviewed, trazodone 25 nightly started on 3/12 19.  Slow transit constipation  Bowel meds increased on 3/9, increased again on 3/11  Improving 20.  Hypokalemia  Potassium 3.6 on 3/14, repeat in 1 week. 21.  Urinary retention  Flomax increased on 3/11  Continue I/O caths as necessary  Improving 22. Disposition: she requests that she be d/ced next week. She will have her roommate with her indefinitely and aunt for 1 week. She also requests that her therapy be in 3 hour slots rather than 5 different sessions so she  can rest in between sessions.  LOS: 10 days A FACE TO FACE EVALUATION WAS PERFORMED  Tonya Deutscher Tonya Myers 07/07/2020, 12:06 PM

## 2020-07-07 NOTE — Progress Notes (Signed)
Follow up with patient and significant other Tonya Myers) to review role of the nurse CM and address educational needs. Reviewed collaboration with the SW to facilitate preparation for discharge. Patient is anxious to go home and would like the date moved forward. [name] discussed rationale for ELOS/date and reviewed currently functional status and plan for discharge. Patient noted her aunt is prepared to assist at discharge as well. Discussed medical issues to monitor including HTN, DM with A1C of 8.0 and HLD. Discussed dietary modifications to address dyslipidemia and transaminitis noted on admission and being monitored per MD. Ongoing training for insulin meal coverage, and Lantus administration for discharge along with blood glucose monitoring. Reviewed CMM diet and low fat/cholesterol diet. Given handouts on diabetes and blood pressure management. Continue to follow along to discharge and address questions/education for discharge. No concerns noted at present. Aware of projected IV abx through 07/24/19.[name]

## 2020-07-07 NOTE — Progress Notes (Signed)
Occupational Therapy Weekly Progress Note  Patient Details  Name: Tonya Myers MRN: [id number] Date of Birth: 09/14/1965  Beginning of progress report period: June 28, 2020 End of progress report period: July 07, 2020     Patient has met 3 of 3 short term goals.  Patient with improved tolerance for change of position and participation in therapeutic activities  Patient continues to demonstrate the following deficits: muscle weakness, decreased cardiorespiratoy endurance, motor apraxia, decreased coordination and decreased motor planning, decreased attention, decreased problem solving, decreased safety awareness and decreased memory and decreased sitting balance, decreased standing balance and decreased postural control and therefore will continue to benefit from skilled OT intervention to enhance overall performance with BADL, iADL and Reduce care partner burden.  Patient progressing toward long term goals..  Continue plan of care.  OT Short Term Goals Week 1:  OT Short Term Goal 1 (Week 1): Pt will maintain sitting balance EOB with Mod A for ~5 minutes during self care activity OT Short Term Goal 1 - Progress (Week 1): Met OT Short Term Goal 2 (Week 1): Pt will complete 1 grooming task w/c level at the sink with no more than Mod A and mod cues OT Short Term Goal 2 - Progress (Week 1): Met OT Short Term Goal 3 (Week 1): Pt will remain OOB for ~45 minutes while engaging in ADL activity OT Short Term Goal 3 - Progress (Week 1): Met Week 2:  OT Short Term Goal 1 (Week 2): patient will complete sit pivot or SB transfers with mod A of one OT Short Term Goal 2 (Week 2): patient will complete side lying to/from sitting with min/mod A of one OT Short Term Goal 3 (Week 2): patient will complete UB bathing/dressing with set up OT Short Term Goal 4 (Week 2): patient will complete LB bathing and dressing with mod A using AD's     Tonya Myers 07/07/2020, 3:34 PM

## 2020-07-08 DIAGNOSIS — G9340 Encephalopathy, unspecified: Secondary | ICD-10-CM | POA: Diagnosis not present

## 2020-07-08 DIAGNOSIS — I10 Essential (primary) hypertension: Secondary | ICD-10-CM | POA: Diagnosis not present

## 2020-07-08 DIAGNOSIS — R7309 Other abnormal glucose: Secondary | ICD-10-CM | POA: Diagnosis not present

## 2020-07-08 DIAGNOSIS — D638 Anemia in other chronic diseases classified elsewhere: Secondary | ICD-10-CM | POA: Diagnosis not present

## 2020-07-08 LAB — GLUCOSE, CAPILLARY
Glucose-Capillary: 124 mg/dL — ABNORMAL HIGH (ref 70–99)
Glucose-Capillary: 225 mg/dL — ABNORMAL HIGH (ref 70–99)
Glucose-Capillary: 60 mg/dL — ABNORMAL LOW (ref 70–99)
Glucose-Capillary: 180 mg/dL — ABNORMAL HIGH (ref 70–99)

## 2020-07-08 NOTE — Progress Notes (Signed)
Speech Language Pathology Daily Session Note  Patient Details  Name: Tonya Myers MRN: [id number] Date of Birth: May 23, 1965  Today's Date: 07/08/2020 SLP Individual Time: 0902-0930 SLP Individual Time Calculation (min): 28 min  Short Term Goals: Week 1: SLP Short Term Goal 1 (Week 1): Pt will demonstrate selective attention in 15 minute intervals with min A verbal cues for redirection. SLP Short Term Goal 2 (Week 1): Pt will demonstrate mildly complex problem solving skills with supervision A verbal cues. SLP Short Term Goal 3 (Week 1): Pt will self-monitor and self-correct functional and verbal errors with supervision A verbal cues. SLP Short Term Goal 4 (Week 1): Pt will demonstrate recall of daily and novel information with aids given max A verbal cues. SLP Short Term Goal 5 (Week 1): Pt will demonstrate higher level word finding skills in structured and unstructured tasks with min A semantic cues.  Skilled Therapeutic Interventions: Skilled ST services focused on cognitive skills. Pt demonstrated improved cognition compared to yesterday's session. Pt demonstrated recall of yesterday's session with supervision A verbal cues. SLP facilitated mildly complex problem solving skills in calendar task, pt dictated what to put on calendar and where. Pt required supervision A verbal cues to include all important information. Pt demonstrated divergent naming of words with initial letter /m/, naming 6 (n=>11) and animals naming 13 (n=>) with min A verbal cues for categorization due to attention. Pt was left in room with call bell within reach and chair alarm set. SLP recommends to continue skilled services.  Pain Pain Assessment Pain Scale: 0-10 Pain Score: 0-No pain Pain Intervention(s): Medication (See eMAR)  Therapy/Group: Individual Therapy  Tonya Myers  [facility] 07/08/2020, 9:31 AM

## 2020-07-08 NOTE — Progress Notes (Signed)
PROGRESS NOTE   Subjective/Complaints: Patient seen sitting up in bed this morning.  She states she slept very well overnight.  Husband at bedside.  Patient states she wants to go home and is perseverative.  Asks about duration of IV antibiotics on multiple occasions.  ROS: Appears to deny CP, shortness of breath, nausea, vomiting, diarrhea.  Objective:   No results found. Recent Labs    07/05/20 1817 07/07/20 0509  WBC 8.1 7.4  HGB 7.8* 7.6*  HCT 26.4* 25.4*  PLT 436* 418*   Recent Labs    07/07/20 0509  NA 139  K 3.6  CL 107  CO2 24  GLUCOSE 166*  BUN 7  CREATININE 0.75  CALCIUM 8.3*    Intake/Output Summary (Last 24 hours) at 07/08/2020 3762 Last data filed at 07/08/2020 0855 Gross per 24 hour  Intake 600 ml  Output -  Net 600 ml        Physical Exam: Vital Signs Blood pressure 139/72, pulse 87, temperature 98.4 F (36.9 C), resp. rate 17, height 6' (1.829 m), weight 99.7 kg, SpO2 97 %. Constitutional: No distress . Vital signs reviewed. HENT: Normocephalic.  Atraumatic. Eyes: EOMI. No discharge. Cardiovascular: No JVD.  RRR. Respiratory: Normal effort.  No stridor.  Bilateral clear to auscultation. GI: Non-distended.  BS +. Skin: Warm and dry.  Intact. Psych: Flat.  Slowed. Musc: No edema in extremities.  No tenderness in extremities. Neurologic: Alert Motor: Bilateral upper extremities: 4/5 proximal distal, stable Bilateral lower extremities: Hip flexion 4/5, distally 4+/5 Bilateral upper extremity ataxia, stable  Assessment/Plan: 1. Functional deficits which require 3+ hours per day of interdisciplinary therapy in a comprehensive inpatient rehab setting.  Physiatrist is providing close team supervision and 24 hour management of active medical problems listed below.  Physiatrist and rehab team continue to assess barriers to discharge/monitor patient progress toward functional and medical  goals  Care Tool:  Bathing  Bathing activity did not occur: Refused           Bathing assist       Upper Body Dressing/Undressing Upper body dressing Upper body dressing/undressing activity did not occur (including orthotics): Refused What is the patient wearing?: Pull over shirt    Upper body assist Assist Level: Maximal Assistance - Patient 25 - 49%    Lower Body Dressing/Undressing Lower body dressing    Lower body dressing activity did not occur: Refused What is the patient wearing?: Incontinence brief,Pants     Lower body assist Assist for lower body dressing: Maximal Assistance - Patient 25 - 49%     Toileting Toileting Toileting Activity did not occur (Clothing management and hygiene only): Refused  Toileting assist Assist for toileting: Total Assistance - Patient < 25%     Transfers Chair/bed transfer  Transfers assist     Chair/bed transfer assist level: Moderate Assistance - Patient 50 - 74%     Locomotion Ambulation   Ambulation assist      Assist level: 2 helpers Assistive device: Ethelene Hal Max distance: 15ft   Walk 10 feet activity   Assist  Walk 10 feet activity did not occur: Safety/medical concerns        Walk  50 feet activity   Assist Walk 50 feet with 2 turns activity did not occur: Safety/medical concerns         Walk 150 feet activity   Assist Walk 150 feet activity did not occur: Safety/medical concerns         Walk 10 feet on uneven surface  activity   Assist Walk 10 feet on uneven surfaces activity did not occur: Safety/medical concerns         Wheelchair     Assist Will patient use wheelchair at discharge?: Yes Type of Wheelchair: Manual Wheelchair activity did not occur: Safety/medical concerns         Wheelchair 50 feet with 2 turns activity    Assist    Wheelchair 50 feet with 2 turns activity did not occur: Safety/medical concerns       Wheelchair 150 feet activity      Assist  Wheelchair 150 feet activity did not occur: Safety/medical concerns       Medical Problem List and Plan: 1.  Decreased functional ability secondary to acute encephalopathy.  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    Continue [name]  2.  Antithrombotics: -DVT/anticoagulation: Continue Lovenox 52.5 mg daily.              -antiplatelet therapy: N/A 3. Pain Management: Oxycodone as needed  Robaxin 500 3 times daily scheduled on 3/12  Controlled with meds on 3/15 4. Mood: Team support             -antipsychotic agents: N/A 5. Neuropsych: This patient is not capable of making decisions on her own behalf. 6. Skin/Wound Care: Routine skin checks 7. Fluids/Electrolytes/Nutrition: Routine in and outs 8.  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  Continue IV Rocephin through 07/23/2020 and follow-up infectious disease. 9.  Diabetes mellitus with hyperglycemia/DKA.  Hemoglobin A1c 8.0.  NovoLog 5 units 3 times daily, Lantus insulin 45 units daily.  Check blood sugars before meals and at bedtime CBG (last 3)  Recent Labs    07/07/20 1644 07/07/20 2102 07/08/20 0624  GLUCAP 122* 94 124*   Slightly labile on 3/15, appears to be stabilizing 10.  CLL.  Follow-up outpatient Dr. Delight Hoh 11.  Hypothyroidism.  Synthroid 12.  Anemia of chronic disease.  Multifactorial.   Hemoglobin 7.6 on 3/14  Continue to monitor 13.  Hypertension.  Dilacor120 mg daily,Micardis 80-25 mg daily           Vitals:   07/07/20 1943 07/08/20 0424  BP: 131/80 139/72  Pulse: 88 87  Resp: 17 17  Temp: 98.4 F (36.9 C) 98.4 F (36.9 C)  SpO2: 99% 97%   Controlled on 3/15 14.  Transaminitis.    ALT elevated, trending down 3/14. 15.  Obesity.  BMI  31.19.  Dietary follow-up 16.  History of asthma.  Continue Singulair 10 mg nightly.   17.  History of genital herpes.  Conitnue Valtrex 18.  Sleep disturbance  Melatonin  ECG reviewed, trazodone 25 nightly started on 3/12  Improving 19.  Slow transit constipation  Bowel meds increased on 3/9, increased again on 3/11  Improving 20.  Hypokalemia  Potassium 3.6 on 3/14 21.  Urinary retention  Flomax increased on 3/11  Continue I/O caths as necessary  Improving  LOS: 11 days A FACE TO FACE EVALUATION WAS PERFORMED  Ankit Lorie Phenix 07/08/2020, 9:22 AM

## 2020-07-08 NOTE — Progress Notes (Signed)
Physical Therapy Session Note  Patient Details  Name: Camala Talwar MRN: [id number] Date of Birth: 01/21/1966  Today's Date: 07/08/2020 PT Individual Time: 1400-1500 PT Individual Time Calculation (min): 60 min   Short Term Goals: Week 2:  PT Short Term Goal 1 (Week 2): Patient to perform supine to sit min A. PT Short Term Goal 2 (Week 2): Patient to perform transfers bed<>chair min A. PT Short Term Goal 3 (Week 2): Patient to perform sit to stand with mod to max A of 1. PT Short Term Goal 4 (Week 2): Patient to ambulate with mod A +2 10'. PT Short Term Goal 5 (Week 2): Patient to perform w/c mobility 11' with S.  Skilled Therapeutic Interventions/Progress Updates:    Patient in supine and reports fatigued from all the therapies today, but agreeable to PT.  Performed supine to R sidelying after min to mod A for scooting with min A, side to sit mod A with cues for technique.  Performed slide board transfer with mod A and max cues +2 for safety with w/c.  Patient in [place] w/c transported to therapy gym.  Obtained standard 20x18 w/c for patient and patient sit to stand in parallel bars x 1 with +2 A.  Standing while w/c changed out.  Patient positioned in w/c with legrests and propelled w/c x 40' in gym with S and cues for technique.  Patient slide board transfer to mat from [facility] with board and mod A.  Sit to side with mod A and cues.  On mat for sidelying clamshell hip abduction x 15 each side, supine bridge x 10 with 5 sec hold and lateral trunk rolls x 10.  Attempted to fix break issue on standard w/c but unable so assisted via slide board uphill to tilt in space with +2 A.  Assisted back to room and to bed via slide board with +1 mod A on level surface.  Sit to supine mod A for LE's and repositioning with +2 A.  Left with call bell in reach and bed alarm active with spouse in the room.   Therapy Documentation Precautions:  Precautions Precautions: Fall,Back Restrictions Weight Bearing  Restrictions: No Other Position/Activity Restrictions: soinal Pain: Pain Assessment Faces Pain Scale: Hurts little more Pain Type: Neuropathic pain Pain Location: Hand Pain Orientation: Right;Left Pain Descriptors / Indicators: Tightness;Tingling Pain Onset: On-going Pain Intervention(s): Rest   Therapy/Group: Individual Therapy  Reginia Naas  Magda Kiel, PT 07/08/2020, 5:11 PM

## 2020-07-08 NOTE — Progress Notes (Signed)
Physical Therapy Session Note  Patient Details  Name: Tonya Myers MRN: [id number] Date of Birth: August 18, 1965  Today's Date: 07/08/2020 PT Individual Time: 0800-0856 PT Individual Time Calculation (min): 56 min   Short Term Goals: Week 2:  PT Short Term Goal 1 (Week 2): Patient to perform supine to sit min A. PT Short Term Goal 2 (Week 2): Patient to perform transfers bed<>chair min A. PT Short Term Goal 3 (Week 2): Patient to perform sit to stand with mod to max A of 1. PT Short Term Goal 4 (Week 2): Patient to ambulate with mod A +2 10'. PT Short Term Goal 5 (Week 2): Patient to perform w/c mobility 42' with S.  Skilled Therapeutic Interventions/Progress Updates:     Pt received supine in bed, husband at bedside, pt agreeable to therapy. MD also at bedside for morning rounds - patient expressing eagerness to return home as soon as possible, questioning her DC date. Educated on barriers to DC and safety concerns, pt ultimately redirectable. Pt requesting to toilet prior to going to rehab gym. Donned hospital socks with totalA and sweatpants with maxA via bridging (required her to roll to pull over hips, requiring modA for rolling with bed features). Supine<>sit with modA with HOB elevated, use of bed rail, completed via log rolling technique. Able to sit unsupported at EOB with close supervision. TotalA for slide board placement and completed slide board transfer with min/modA towards her L side (bed elevated) to drop arm BSC. Once seated on BSC, performed sit<>stand (1/2 stand) with modA and B knee block, using BUE to push from [facility] arm rests. Required totalA for managing clothes and brief. Pt continent of B + B, charted in flowsheets. She requested female only for pericare. Performed multiple 1/2 stands from [facility] with similar fashion listed above with female therapy tech providing totalA for posterior pericare - performed a total of 6 1/2 stands with brief seated rests b/w bouts. Slide board  transfer back to bed with modA with cues for head/hip and sequencing. Performed additional slide board transfer with modA to her TIS w/c, minA for repositioning. Due to time constraints unable to go to rehab gym today but pt requesting to end session in recliner - performed a 4th slide board transfer with min/modA from [place] w/c to droparm recliner. Provided pillows for lumbar support and BLE elevated. Safety belt alarm on and needs within reach with husband at bedside at end of session. Pt thankful for therapist interventions, cooperative during session.  Therapy Documentation Precautions:  Precautions Precautions: Fall,Back Restrictions Weight Bearing Restrictions: No Other Position/Activity Restrictions: soinal General:    Therapy/Group: Individual Therapy  Runa Whittingham P Gerene Nedd PT 07/08/2020, 7:33 AM

## 2020-07-08 NOTE — Progress Notes (Signed)
Occupational Therapy Session Note  Patient Details  Name: Tonya Myers MRN: [id number] Date of Birth: 1965-10-26  Today's Date: 07/08/2020 OT Individual Time: 1100-1200 OT Individual Time Calculation (min): 60 min    Short Term Goals: Week 2:  OT Short Term Goal 1 (Week 2): patient will complete sit pivot or SB transfers with mod A of one OT Short Term Goal 2 (Week 2): patient will complete side lying to/from sitting with min/mod A of one OT Short Term Goal 3 (Week 2): patient will complete UB bathing/dressing with set up OT Short Term Goal 4 (Week 2): patient will complete LB bathing and dressing with mod A using AD's  Skilled Therapeutic Interventions/Progress Updates:    Patient in bed, alert and ready for therapy session.  Attention to task improving, cooperative behavior.  Husband present for session.   She denies pain.  Rolling right CS, side lying to sitting edge of bed with mod A.  Improved upright posture, seated balance with CS.  SB transfer bed to/from TIS w/c with mod A of one and stand by of 2nd.  Note improved ability to sequence and follow cues for transfer technique.  Changed OH shirt with min/mod A, oral care with set up and min A.   Completed trunk mobility and posture activities with focus on trunk extension and scapular mobility.  Completed hand dexterity activities, provided and practiced with theraputty, handwriting with felt tip pens/markers.  Shoulder and scapular therapeutic exercises.  She returned to bed at close of session, bed alarm set and call bell in hand.    Therapy Documentation Precautions:  Precautions Precautions: Fall,Back Restrictions Weight Bearing Restrictions: No Other Position/Activity Restrictions: soinal  Therapy/Group: Individual Therapy  Carlos Levering 07/08/2020, 7:36 AM

## 2020-07-09 DIAGNOSIS — B9689 Other specified bacterial agents as the cause of diseases classified elsewhere: Secondary | ICD-10-CM | POA: Diagnosis not present

## 2020-07-09 DIAGNOSIS — R7309 Other abnormal glucose: Secondary | ICD-10-CM | POA: Diagnosis not present

## 2020-07-09 DIAGNOSIS — D638 Anemia in other chronic diseases classified elsewhere: Secondary | ICD-10-CM | POA: Diagnosis not present

## 2020-07-09 DIAGNOSIS — G061 Intraspinal abscess and granuloma: Secondary | ICD-10-CM | POA: Diagnosis not present

## 2020-07-09 DIAGNOSIS — I10 Essential (primary) hypertension: Secondary | ICD-10-CM | POA: Diagnosis not present

## 2020-07-09 DIAGNOSIS — G039 Meningitis, unspecified: Secondary | ICD-10-CM | POA: Diagnosis not present

## 2020-07-09 DIAGNOSIS — G9340 Encephalopathy, unspecified: Secondary | ICD-10-CM | POA: Diagnosis not present

## 2020-07-09 LAB — GLUCOSE, CAPILLARY
Glucose-Capillary: 89 mg/dL (ref 70–99)
Glucose-Capillary: 109 mg/dL — ABNORMAL HIGH (ref 70–99)
Glucose-Capillary: 88 mg/dL (ref 70–99)
Glucose-Capillary: 78 mg/dL (ref 70–99)

## 2020-07-09 MED ORDER — INSULIN GLARGINE 100 UNIT/ML ~~LOC~~ SOLN
40.0000 [IU] | Freq: Every day | SUBCUTANEOUS | Status: DC
  Administered 2020-07-10 – 2020-07-14 (×5): 40 [IU] via SUBCUTANEOUS
  Filled 2020-07-09 (×5): qty 0.4

## 2020-07-09 NOTE — Progress Notes (Signed)
Speech Language Pathology Daily Session Note  Patient Details  Name: Tonya Myers MRN: [id number] Date of Birth: 03-15-1966  Today's Date: 07/09/2020 SLP Individual Time: 0905-1000 SLP Individual Time Calculation (min): 55 min  Short Term Goals: Week 1: SLP Short Term Goal 1 (Week 1): Pt will demonstrate selective attention in 15 minute intervals with min A verbal cues for redirection. SLP Short Term Goal 2 (Week 1): Pt will demonstrate mildly complex problem solving skills with supervision A verbal cues. SLP Short Term Goal 3 (Week 1): Pt will self-monitor and self-correct functional and verbal errors with supervision A verbal cues. SLP Short Term Goal 4 (Week 1): Pt will demonstrate recall of daily and novel information with aids given max A verbal cues. SLP Short Term Goal 5 (Week 1): Pt will demonstrate higher level word finding skills in structured and unstructured tasks with min A semantic cues.  Skilled Therapeutic Interventions:   Patient reported that she was able to sleep better last night and night before and slept "3 hours". She was more animated, smiling and laughing some today. SLP arrived for skilled ST session focusing on cognitive-linguistic goals. Patient requested to be able to lay in bed semi reclined. She was able to recall therapy tasks completed with SLP previous date and required very minimal cues for this. She performed novel language task of responsive naming without difficulty and without significant cues. When performing another novel language task (matching adjectives to nouns), she did require minA cues to perform. SLP asked patient what she would like to work on and she mentioned medication/pill box task that previous SLP had initiated. SLP made list of patient's current medications with plan to have patient demonstrate knowledge of each medication's use/purpose next date. Patient continues to benefit from skilled SLP intervention to maximize cognitive-linguistic  abilities prior to discharge.  Pain Pain Assessment Pain Scale: 0-10 Pain Score: 0-No pain Faces Pain Scale: Hurts little more Pain Type: Acute pain Pain Location: Neck Pain Orientation: Right;Left Pain Descriptors / Indicators: Aching;Sharp Pain Frequency: Constant Pain Onset: On-going Pain Intervention(s): Medication (See eMAR);Heat applied  Therapy/Group: Individual Therapy  [name], MA, CCC-SLP Speech Therapy

## 2020-07-09 NOTE — Progress Notes (Signed)
Physical Therapy Session Note  Patient Details  Name: Tonya Myers MRN: [id number] Date of Birth: 1965-10-03  Today's Date: 07/09/2020 PT Individual Time: 1300-1410 PT Individual Time Calculation (min): 70 min   Short Term Goals: Week 2:  PT Short Term Goal 1 (Week 2): Patient to perform supine to sit min A. PT Short Term Goal 2 (Week 2): Patient to perform transfers bed<>chair min A. PT Short Term Goal 3 (Week 2): Patient to perform sit to stand with mod to max A of 1. PT Short Term Goal 4 (Week 2): Patient to ambulate with mod A +2 10'. PT Short Term Goal 5 (Week 2): Patient to perform w/c mobility 75' with S.  Skilled Therapeutic Interventions/Progress Updates:    Patient in supine and reports slept well overnight.  Having neck pain, but RN reports not time for Tylenol, but can have Tramadol, pt wanting to wait for after therapy to allow for less sleepiness.  Patient rolled to R with min A and cues, side to sit mod A.  Seated for donning shoes with total A.  Patient performed slide board transfer with mod A +2 for safety.  Pushed in tilt in space w/c to therapy gym.  Sit to stand x2 max A from w/c in parallel bars and replaced tilt in space for standard w/c.  Initially flexed posture, second time able to stand upright more with hip extension.  Patient in standard w/c propelled x 60' with cues and close S to min A and cues for technique to keep straight.  Pushed in w/c to ortho gym.  Slide board transfer to mat with mod A.  Sit to supine mod A and repositioned with cues and mod A.  Patient performed therex including bridging w/ A & cues 3 sec hold x 10, hooklying marching x 10, rolled to sit min A for clamshells 2 x 15 reps.   Side to sit mod A and cues.  Slide board transfer to standard w/c with mod A slightly higher to lower.  Patient in w/c pushed by spouse to nursing station and propelled rest of distance to her room x 50'.  Patient transfer to bed via slide board with mod A and +1 for  keeping board from moving and mod cues for increased lifting with legs to prevent skin breakdown as voiced concern with getting sore on bottom from scooting.  Patient sit to supine mod A for LE's and repositioned with A.  Left with call bell in reach, bed alarm activated, spouse in the room and needs in reach.  Therapy Documentation Precautions:  Precautions Precautions: Fall,Back Restrictions Weight Bearing Restrictions: No Other Position/Activity Restrictions: soinal Pain: Pain Assessment Pain Scale: 0-10 Pain Score: 8  Faces Pain Scale: Hurts little more Pain Type: Acute pain Pain Location: Neck Pain Descriptors / Indicators: Aching Pain Onset: On-going Pain Intervention(s): Repositioned;Heat applied   Therapy/Group: Individual Therapy  Reginia Naas  John Day, [place] 07/09/2020, 2:13 PM

## 2020-07-09 NOTE — Consult Note (Signed)
Neuropsychological Consultation   Patient:   Tonya Myers   DOB:   1965/12/14  MR Number:  [id number]  Location:  [facility] [id number] [place] [address] Dept: [place]: [phone]           Date of Service:   07/10/2015  Start Time:   3 PM End Time:   4 PM  Provider/Observer:  Ilean Skill, Psy.D.       Clinical Neuropsychologist       Billing Code/Service: [phone]  Chief Complaint:    Tonya Myers is a 55 year old female with history of diabetes, CLL, chronic back pain, hypertension.  Patient presented on 06/09/2020 with altered mental status and aphasia.  Patient was unable to fully express herself as to why she was presenting to the ED and it was clear indications of altered mental status.  Cranial CT scan and MRI of brain were normal and EEG status was negative for seizure.  Patient underwent LP with CSF examination showing gram-positive infection.  MRI cervical lumbar thoracic spine showed findings compatible with lumbar spinal infection.  There appeared to be septic arthritis in the left L2-3 and 3-4 facets with adjacent paraspinous abscesses noted.  Significant infection/abscess was noted throughout lumbar spine.  Patient underwent laminectomy evacuation of abscesses 06/17/2020.  Patient is to continue broad-spectrum antibiotics through 07/23/2020.  Patient has continued to struggle with extended hospital stay during her [name] admission.  Patient is struggled with fatigue and pain during therapies and motivation at times has been an issue.  She is continued to need reassurance and encouragement from her husband and therapeutic staff.  Reason for Service:  Patient was referred for neuropsychological consultation due to coping and adjustment issues.  Below is the HPI for the current admission.  HPI: Tonya Myers is a 55 year old right-handed female with history of diabetes  mellitus, CLL, chronic back pain, hypertension.  Per chart review patient lives with spouse.  Two-level home bed and bath main level.  Independent prior to admission.  Presented 06/09/2020 with altered mental status and aphasia.  Patient had evidently presented earlier that day and left without being seen after having labs done.  She returned to the ED was unable to fully express why she was there with altered mental status noted.  Complaints of neck pain.  Cranial CT scan negative.  MRI normal brain.  EEG negative for seizure.  Chest x-ray no active disease.  Echocardiogram with ejection fraction of 50 to 55% no wall motion abnormalities.  Admission chemistries unremarkable aside glucose 274 creatinine 1.35, WBC 15,500 hemoglobin 10.1, troponin negative, hemoglobin A1c 8.1.  TSH 5.742, alkaline phosphatase 200 total bilirubin 2.1 direct bilirubin 1.2 SARS coronavirus negative, ammonia negative.  Patient subsequently spiked a fever as well as maintain on insulin therapy suspect DKA presentation.  She was placed on broad-spectrum antibiotics for fever and concern of meningitis   She underwent LP.  CSF examination showed gram-positive diplococci.  MRI cervical lumbar thoracic spine showed findings compatible lumbar spinal infection.  There appeared to be septic arthritis in the left L2-3 and 3-4 facets with adjacent paraspinous abscesses.  Ventral epidural abscess at L3-4 small but appears mildly progressive.  She was placed on broad-spectrum antibiotics for suspected meningitis.  Patient underwent T3-4 laminectomy, T5-7 laminectomy, 9-10 laminectomy evacuation of abscess 06/17/2020 per Dr. Lysle Morales with repeat lumbar paraspinal abscess aspiration by interventional radiology 06/19/2020.  Recommendations to continue ceftriaxone total of 6  weeks through 07/23/2020.Marland Kitchen  Patient was cleared to begin Lovenox for DVT prophylaxis.  Close monitoring of liver functions as they remained elevated but stable.  Due to patient's  overall decrease in functional ability and need for physical assistance she was admitted for a comprehensive rehab program.  Current Status:  Patient was alert and oriented with good mental status noted.  She acknowledged continued mild impairments cognitively but that there have been significant progress and improvement which was reinforced by her significant other who was also present today.  The patient reported being somewhat embarrassed by what she is told she did and said initially in her hospitalization and had some concerns that people were judging her on those actions.  Patient described how challenging her therapies have been and that she had already had significant chronic pain before this infectious process developed.  The patient denied significant mood disturbance but clearly was frustrated and wanted to go home but ultimately agreed and understood the reasons for her being here and her need for continued therapeutic interventions to get maximal recovery.  The patient had one very specific complain about an event that occurred over the weekend related to efforts to get her to stand while she was in the bathroom and the patient reports that this caused her a great deal of fear and apprehension and clearly has significant fears about pain experiences and something happening that will worsen her status.  Behavioral Observation: Tonya Myers  presents as a 55 y.o.-year-old Right African American Female who appeared her stated age. her dress was Appropriate and she was Well Groomed and her manners were Appropriate to the situation.  her participation was indicative of Appropriate and Redirectable behaviors.  There were physical disabilities noted.  she displayed an appropriate level of cooperation and motivation.     Interactions:    Active Redirectable and Resistant  Attention:   abnormal and attention span appeared shorter than expected for age  Memory:   within normal limits; recent and  remote memory intact  Visuo-spatial:  not examined  Speech (Volume):  normal  Speech:   normal; normal  Thought Process:  Coherent and Relevant  Though Content:  Rumination; not suicidal and not homicidal  Orientation:   person, place, time/date and situation  Judgment:   Fair  Planning:   Fair  Affect:    Anxious and Irritable  Mood:    Dysphoric  Insight:   Fair  Intelligence:   normal  Medical History:   Past Medical History:  Diagnosis Date  . Acute hemorrhoid 03/11/2015  . Anxiety   . Arthritis   . Asthma   . Cancer ([facility])   . Chronic back pain   . CLL (chronic lymphocytic leukemia) ([facility])   . Depression   . Diabetes mellitus without complication ([facility])   . Genital herpes    type 2  . Hypertension   . Hypothyroidism   . Vertigo          Patient Active Problem List   Diagnosis Date Noted  . Urinary retention   . Slow transit constipation   . Meningitis   . Labile blood glucose   . Uncontrolled type 2 diabetes mellitus with hyperosmolar nonketotic hyperglycemia ([facility])   . Transaminitis   . Sleep disturbance   . Anemia of chronic disease   . Bacterial spinal epidural abscess 06/27/2020  . Obesity (BMI 30.0-34.9)   . Hypomagnesemia   . Hypernatremia   . 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[facility])   . Epidural abscess   .  Pneumococcal meningitis   . Aphasia 06/10/2020  . Hyperglycemic crisis in diabetes mellitus ([facility]) 06/10/2020  . Acute metabolic encephalopathy [id number]/2878  . Severe sepsis ([facility])   . Diabetic ketoacidosis without coma associated with type 2 diabetes mellitus ([facility])   . AKI (acute kidney injury) ([facility])   . Elevated LFTs   . Acute encephalopathy 06/09/2020  . Bacterial vaginosis 11/01/2016  . Chronic pain in right shoulder 03/23/2016  . Numbness and tingling 03/23/2016  . Uncontrolled type 2 diabetes mellitus with hyperglycemia, without long-term current use of insulin ([facility]) 10/27/2015  . DDD (degenerative disc disease), cervical 07/29/2015  . Cervical  disc disorder with radiculopathy of cervical region 07/29/2015  . Cervical facet syndrome 07/29/2015  . DDD (degenerative disc disease), lumbar 07/29/2015  . Facet syndrome, lumbar 07/29/2015  . Bilateral occipital neuralgia 07/29/2015  . Sacroiliac joint dysfunction 07/29/2015  . DJD of shoulder 07/29/2015  . Herpes simplex vulvovaginitis 05/29/2015  . Acute hemorrhoid 03/11/2015  . CLL (chronic lymphocytic leukemia) ([facility]) 01/15/2015  . Hypothyroidism 12/26/2014  . Arthritis 12/26/2014  . Carpal tunnel syndrome, bilateral 12/26/2014  . Controlled type 2 diabetes mellitus without complication, without long-term current use of insulin ([facility]) 12/26/2014  . Essential hypertension 12/26/2014  . Hyperlipidemia, mixed 12/26/2014    Psychiatric History:  Patient does have a prior history of anxiety as well as significant chronic pain history.  Family Med/Psych History:  Family History  Problem Relation Age of Onset  . Cancer Paternal Aunt   . Breast cancer Maternal Aunt 32   Impression/DX:  Tonya Myers is a 55 year old female with history of diabetes, CLL, chronic back pain, hypertension.  Patient presented on 06/09/2020 with altered mental status and aphasia.  Patient was unable to fully express herself as to why she was presenting to the ED and it was clear indications of altered mental status.  Cranial CT scan and MRI of brain were normal and EEG status was negative for seizure.  Patient underwent LP with CSF examination showing gram-positive infection.  MRI cervical lumbar thoracic spine showed findings compatible with lumbar spinal infection.  There appeared to be septic arthritis in the left L2-3 and 3-4 facets with adjacent paraspinous abscesses noted.  Significant infection/abscess was noted throughout lumbar spine.  Patient underwent laminectomy evacuation of abscesses 06/17/2020.  Patient is to continue broad-spectrum antibiotics through 07/23/2020.  Patient has continued to struggle  with extended hospital stay during her [name] admission.  Patient is struggled with fatigue and pain during therapies and motivation at times has been an issue.  She is continued to need reassurance and encouragement from her husband and therapeutic staff.  Patient was alert and oriented with good mental status noted.  She acknowledged continued mild impairments cognitively but that there have been significant progress and improvement which was reinforced by her significant other who was also present today.  The patient reported being somewhat embarrassed by what she is told she did and said initially in her hospitalization and had some concerns that people were judging her on those actions.  Patient described how challenging her therapies have been and that she had already had significant chronic pain before this infectious process developed.  The patient denied significant mood disturbance but clearly was frustrated and wanted to go home but ultimately agreed and understood the reasons for her being here and her need for continued therapeutic interventions to get maximal recovery.  The patient had one very specific complain about an event that occurred over the weekend related to  efforts to get her to stand while she was in the bathroom and the patient reports that this caused her a great deal of fear and apprehension and clearly has significant fears about pain experiences and something happening that will worsen her status.  Disposition/Plan:  Today we worked on coping and adjustment issues and reinforced the need for continued rehabilitative efforts.  The patient feels like she is being pushed beyond her capacity although each day she is able to complete the therapies asked of her.  She would likely continue to need ongoing encouragement throughout as there are some underlying anxiety about being harmed are injuring herself further.          Electronically Signed   _______________________ Ilean Skill, Psy.D. Clinical Neuropsychologist

## 2020-07-09 NOTE — Patient Care Conference (Signed)
Inpatient RehabilitationTeam Conference and Plan of Care Update Date: 07/09/2020   Time: 11:44 AM    Patient Name: Tonya Myers      Medical Record Number: [id number]  Date of Birth: 02/20/1966 Sex: Female         Room/Bed: 4W20C/4W20C-01 Payor Info: Payor: MEDICARE / Plan: MEDICARE PART A AND B / Product Type: *No Product type* /    Admit Date/Time:  06/27/2020 12:46 PM  Primary Diagnosis:  Acute encephalopathy  Hospital Problems: Principal Problem:   Acute encephalopathy Active Problems:   CLL (chronic lymphocytic leukemia) (HCC)   Acute hemorrhoid   DDD (degenerative disc disease), cervical   Cervical disc disorder with radiculopathy of cervical region   Cervical facet syndrome   DDD (degenerative disc disease), lumbar   Facet syndrome, lumbar   Bilateral occipital neuralgia   Sacroiliac joint dysfunction   DJD of shoulder   Hypothyroidism   Arthritis   Carpal tunnel syndrome, bilateral   Chronic pain in right shoulder   Controlled type 2 diabetes mellitus without complication, without long-term current use of insulin (HCC)   Essential hypertension   Herpes simplex vulvovaginitis   Pneumococcal meningitis   Bacterial spinal epidural abscess   Meningitis   Labile blood glucose   Uncontrolled type 2 diabetes mellitus with hyperosmolar nonketotic hyperglycemia (HCC)   Transaminitis   Sleep disturbance   Anemia of chronic disease   Slow transit constipation   Urinary retention    Expected Discharge Date: Expected Discharge Date: 07/24/20  Team Members Present: Care Coodinator Present: Becky Dupree, [name];Deborah Hervey Ard, RN, BSN, CRRN Nurse Present: Isla Pence, RN PT Present: Magda Kiel, PT OT Present: Simonne Come, OT SLP Present: Nadara Mode, SLP PPS Coordinator present : Gunnar Fusi, Novella Olive, PT     Current Status/Progress Goal Weekly Team Focus  Bowel/Bladder   Pt continent of urine with intermittent incontinence. Pt LBM 07/08/2020  Pt to be  fully continent.  Assess B/B every shift and PRN   Swallow/Nutrition/ Hydration             ADL's   cooperative, attention improving, LB bathing/dressing mod/max A bed level, UB bathing/dressing min/mod A, grooming min A, SB transfers mod A of one with standby of 2nd  min A overall  trunk control/posture, adl and transfer training, patient/family education, DME assessment   Mobility   mod A bed mobility, mod A level slide board transfers, +2 low to high and sit to stand, initiating w/c mobility in standard chair S x 35'  min A 50' ambulation, w/c mobility S  LE/core strength, endurance, standing tolerance, conditioning, caregiver ed   Communication             Safety/Cognition/ Behavioral Observations            Pain   Pt with intermittent pain resolved with tylenol PRN  Pain level <3/10  Assess pain every shift and PRN   Skin   Healing incision to back  no new breakdown  Assess skin every shift and PRN     Discharge Planning:  Husband here daily to see in therapies and provide support. Pt makking progress in therapies and very pleased with herself.   Team Discussion: IV Abx through 07/24/20. Patient is anxious to go home. Bowel and bladder management improved as well as attention and increased participation in therapy. Patient has been more receptive to therapy recommendations and motivated to participate in therapy sessions.  Patient on target to meet rehab goals: yes, currently mod  assist for lower body care and min assist for upper body care . Mod assist for slide-board transfers and requires +2 assist for low - high transfers with slide-board.   *See Care Plan and progress notes for long and short-term goals.   Revisions to Treatment Plan:  Working on core strength and endurance Tilt in space wheelchair  Teaching Needs: Medications, transfers, toileting, etc.   Current Barriers to Discharge: Decreased caregiver support, Home enviroment access/layout and IV  antibiotics  Possible Resolutions to Barriers: Family education with spouse and aunt     Medical Summary Current Status: Decreased functional ability secondary to acute encephalopathy  Barriers to Discharge: Medical stability;Behavior;IV antibiotics   Possible Resolutions to [employer] Focus: Therapies, Optimize DM/HTN meds, follow labs - Hb, LFTs, optimize bowel meds, bladder meds   Continued Need for Acute Rehabilitation Level of Care: The patient requires daily medical management by a physician with specialized training in physical medicine and rehabilitation for the following reasons: Direction of a multidisciplinary physical rehabilitation program to maximize functional independence : Yes Medical management of patient stability for increased activity during participation in an intensive rehabilitation regime.: Yes Analysis of laboratory values and/or radiology reports with any subsequent need for medication adjustment and/or medical intervention. : Yes   I attest that I was present, lead the team conference, and concur with the assessment and plan of the team.   [name] B 07/09/2020, 2:48 PM

## 2020-07-09 NOTE — Progress Notes (Signed)
Patient ID: Tonya Myers, female   DOB: [birth year], 55 y.o.   MRN: [id number] Met with pt and husband to inform of team conference progress toward her goals of min assist level. Addressed pt wanting to discharge sooner than 3/31. Made aware IV antibiotics are done on 3/30 and pt is not at a level she can be managed by one person. She is ok with staying until the 54 [address]. She can see her progress but wants to have rest breaks in her schedule. She reports: " I'm not 55 years old, I can't do back to back therapies."  Have let schedulers know about her preference. Will continue to work toward discharge. Husband voiced he can see progress daily and tries to encourage her as much as he can. Pt states: " He's my cheerleader."

## 2020-07-09 NOTE — Progress Notes (Signed)
Occupational Therapy Session Note  Patient Details  Name: Tonya Myers MRN: [id number] Date of Birth: 28-Jun-1965  Today's Date: 07/09/2020 OT Individual Time: 1100-1157 OT Individual Time Calculation (min): 57 min    Short Term Goals: Week 1:  OT Short Term Goal 1 (Week 1): Pt will maintain sitting balance EOB with Mod A for ~5 minutes during self care activity OT Short Term Goal 1 - Progress (Week 1): Met OT Short Term Goal 2 (Week 1): Pt will complete 1 grooming task w/c level at the sink with no more than Mod A and mod cues OT Short Term Goal 2 - Progress (Week 1): Met OT Short Term Goal 3 (Week 1): Pt will remain OOB for ~45 minutes while engaging in ADL activity OT Short Term Goal 3 - Progress (Week 1): Met  Skilled Therapeutic Interventions/Progress Updates:    1:1. Pt received in bed recalling this OT and tech by name and reporting pain in back, however RN already administered medicaiton. Pt completes sup>sit to doff shirt with MOD A and don new shirt with MIN A for dynamic sitting balance and A to pull down back. Pt reporting need to toilet and completes +2 SB transfer to/from [facility] on level surface with +2 assisting steady ing quipment and removing board. Pt with improved power up to push on transfer to toilet v back to bed for hygeine. Pt requires total A for hygeine at bed level after BM and bladder. Pt able to thread BLE into pants in supported long sitting/circle sitting and requires MIN A to roll and MOD HOH A to pull pants up back in supine. Pt unable ot don socks without breaking precautions in supported long sitting. Pt sits EOB for UE assessment as follows:   Box and blocks assessment seated EOB with S for sittng balance and encouragement to complete the full minute  RUE: 23 LUE: 18 (6 block improvement)  Exited session with pt seated in bed, exit alarm on and call light in reach   Therapy Documentation Precautions:  Precautions Precautions:  Fall,Back Restrictions Weight Bearing Restrictions: No Other Position/Activity Restrictions: soinal General:   Vital Signs: Therapy Vitals Temp: 98.4 F (36.9 C) Pulse Rate: 85 Resp: 18 BP: (!) 148/78 Patient Position (if appropriate): Sitting Oxygen Therapy SpO2: 99 % O2 Device: Room Air Pain: Pain Assessment Pain Scale: 0-10 Pain Score: 3  Pain Type: Acute pain Pain Location: Neck Pain Orientation: Posterior Pain Intervention(s): Medication (See eMAR) ADL: ADL ADL Comments: Unable to assess any ADL areas due to pts unwillingness to participate/agitation and aggressive behaviors Vision   Perception    Praxis   Exercises:   Other Treatments:     Therapy/Group: Individual Therapy  [name] 07/09/2020, 6:56 AM

## 2020-07-09 NOTE — Progress Notes (Signed)
PROGRESS NOTE   Subjective/Complaints: Patient seen laying in bed this morning.  She states she slept very well overnight.  Significant other at bedside.  She is requesting for therapies not be back to back.  She again reiterates that she wants to go home ASAP.  ROS: Denies CP, shortness of breath, nausea, vomiting, diarrhea.  Objective:   No results found. Recent Labs    07/07/20 0509  WBC 7.4  HGB 7.6*  HCT 25.4*  PLT 418*   Recent Labs    07/07/20 0509  NA 139  K 3.6  CL 107  CO2 24  GLUCOSE 166*  BUN 7  CREATININE 0.75  CALCIUM 8.3*    Intake/Output Summary (Last 24 hours) at 07/09/2020 1007 Last data filed at 07/09/2020 0700 Gross per 24 hour  Intake 240 ml  Output --  Net 240 ml        Physical Exam: Vital Signs Blood pressure (!) 148/78, pulse 85, temperature 98.4 F (36.9 C), resp. rate 18, height 6' (1.829 m), weight 99.7 kg, SpO2 99 %. Constitutional: No distress . Vital signs reviewed. HENT: Normocephalic.  Atraumatic. Eyes: EOMI. No discharge. Cardiovascular: No JVD.  RRR. Respiratory: Normal effort.  No stridor.  Bilateral clear to auscultation. GI: Non-distended.  BS +. Skin: Warm and dry.  Intact. Psych: Flat.  Slowed. Musc: No edema in extremities.  No tenderness in extremities. Neurologic: Alert Motor: Bilateral upper extremities: 4/5 proximal distal, stable Bilateral lower extremities: Hip flexion 4/5, distally 4+/5 Bilateral upper extremity ataxia, stable  Assessment/Plan: 1. Functional deficits which require 3+ hours per day of interdisciplinary therapy in a comprehensive inpatient rehab setting.  Physiatrist is providing close team supervision and 24 hour management of active medical problems listed below.  Physiatrist and rehab team continue to assess barriers to discharge/monitor patient progress toward functional and medical goals  Care Tool:  Bathing  Bathing activity  did not occur: Refused           Bathing assist       Upper Body Dressing/Undressing Upper body dressing Upper body dressing/undressing activity did not occur (including orthotics): Refused What is the patient wearing?: Pull over shirt    Upper body assist Assist Level: Moderate Assistance - Patient 50 - 74%    Lower Body Dressing/Undressing Lower body dressing    Lower body dressing activity did not occur: Refused What is the patient wearing?: Incontinence brief,Pants     Lower body assist Assist for lower body dressing: Maximal Assistance - Patient 25 - 49%     Toileting Toileting Toileting Activity did not occur (Clothing management and hygiene only): Refused  Toileting assist Assist for toileting: Total Assistance - Patient < 25%     Transfers Chair/bed transfer  Transfers assist     Chair/bed transfer assist level: Moderate Assistance - Patient 50 - 74%     Locomotion Ambulation   Ambulation assist      Assist level: 2 helpers Assistive device: Ethelene Hal Max distance: 67ft   Walk 10 feet activity   Assist  Walk 10 feet activity did not occur: Safety/medical concerns        Walk 50 feet activity   Assist  Walk 50 feet with 2 turns activity did not occur: Safety/medical concerns         Walk 150 feet activity   Assist Walk 150 feet activity did not occur: Safety/medical concerns         Walk 10 feet on uneven surface  activity   Assist Walk 10 feet on uneven surfaces activity did not occur: Safety/medical concerns         Wheelchair     Assist Will patient use wheelchair at discharge?: Yes Type of Wheelchair: Manual Wheelchair activity did not occur: Safety/medical concerns  Wheelchair assist level: Supervision/Verbal cueing Max wheelchair distance: 42'    Wheelchair 50 feet with 2 turns activity    Assist    Wheelchair 50 feet with 2 turns activity did not occur: Safety/medical concerns        Wheelchair 150 feet activity     Assist  Wheelchair 150 feet activity did not occur: Safety/medical concerns       Medical Problem List and Plan: 1.  Decreased functional ability secondary to acute encephalopathy.  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    Continue [name]   Team conference today to discuss current and goals and coordination of care, home and environmental barriers, and discharge planning with nursing, case manager, and therapies. Please see conference note from today as well.  2.  Antithrombotics: -DVT/anticoagulation: Continue Lovenox 52.5 mg daily.              -antiplatelet therapy: N/A 3. Pain Management: Oxycodone as needed  Robaxin 500 3 times daily scheduled on 3/12  Controlled with meds on 3/16 4. Mood: Team support             -antipsychotic agents: N/A 5. Neuropsych: This patient is not capable of making decisions on her own behalf. 6. Skin/Wound Care: Routine skin checks 7. Fluids/Electrolytes/Nutrition: Routine in and outs 8.  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  Continue IV Rocephin through 07/23/2020 and follow-up infectious disease. 9.  Diabetes mellitus with hyperglycemia/DKA.  Hemoglobin A1c 8.0.  Check blood sugars before meals and at bedtime  CBG (last 3)  Recent Labs    07/08/20 1654 07/08/20 2103 07/09/20 0606  GLUCAP 60* 180* 89   Lantus insulin 45 units daily, decreased to 40 on 3/17  NovoLog 5 units 3 times daily, will likely need to increase.  10.  CLL.  Follow-up outpatient Dr. Delight Hoh 11.  Hypothyroidism.  Synthroid 12.  Anemia of chronic disease.  Multifactorial.   Hemoglobin 7.6 on 3/14  Continue to monitor 13.  Hypertension.  Dilacor120 mg daily,Micardis 80-25 mg daily            Vitals:   07/08/20 1925 07/09/20 0419  BP: (!) 141/77 (!) 148/78  Pulse: (!) 102 85  Resp: 18 18  Temp: 98.3 F (36.8 C) 98.4 F (36.9 C)  SpO2: 100% 99%   Mild elevated on 3/16 14.  Transaminitis.    ALT elevated, trending down 3/14. 15.  Obesity.  BMI 31.19.  Dietary follow-up 16.  History of asthma.  Continue Singulair 10 mg nightly.   17.  History of genital herpes.  Conitnue Valtrex 18.  Sleep disturbance  Melatonin  ECG reviewed, trazodone 25 nightly started on 3/12  Improving 19.  Slow transit constipation  Bowel meds increased on 3/9, increased  again on 3/11  Improving 20.  Hypokalemia  Potassium 3.6 on 3/14 21.  Urinary retention  Flomax increased on 3/11  Continue I/O caths as necessary  Improving  LOS: 12 days A FACE TO FACE EVALUATION WAS PERFORMED  Tonya Myers Lorie Phenix 07/09/2020, 10:07 AM

## 2020-07-10 DIAGNOSIS — G9340 Encephalopathy, unspecified: Secondary | ICD-10-CM | POA: Diagnosis not present

## 2020-07-10 LAB — GLUCOSE, CAPILLARY
Glucose-Capillary: 76 mg/dL (ref 70–99)
Glucose-Capillary: 80 mg/dL (ref 70–99)
Glucose-Capillary: 135 mg/dL — ABNORMAL HIGH (ref 70–99)
Glucose-Capillary: 127 mg/dL — ABNORMAL HIGH (ref 70–99)

## 2020-07-10 MED ORDER — INSULIN ASPART 100 UNIT/ML ~~LOC~~ SOLN
4.0000 [IU] | Freq: Three times a day (TID) | SUBCUTANEOUS | Status: DC
  Administered 2020-07-10 – 2020-07-11 (×2): 4 [IU] via SUBCUTANEOUS

## 2020-07-10 MED ORDER — SODIUM CHLORIDE 0.9% FLUSH
10.0000 mL | INTRAVENOUS | Status: DC | PRN
  Administered 2020-07-23 – 2020-07-26 (×2): 10 mL

## 2020-07-10 MED ORDER — METFORMIN HCL 500 MG PO TABS
500.0000 mg | ORAL_TABLET | Freq: Every day | ORAL | Status: DC
  Administered 2020-07-11: 500 mg via ORAL
  Filled 2020-07-10: qty 1

## 2020-07-10 MED ORDER — SODIUM CHLORIDE 0.9% FLUSH
10.0000 mL | Freq: Two times a day (BID) | INTRAVENOUS | Status: DC
  Administered 2020-07-10 – 2020-07-28 (×28): 10 mL

## 2020-07-10 NOTE — Progress Notes (Signed)
Occupational Therapy Session Note  Patient Details  Name: Tonya Myers MRN: [id number] Date of Birth: [birth year]  Today's Date: 07/10/2020 OT Individual Time: 1300-1357 OT Individual Time Calculation (min): 57 min    Short Term Goals: Week 2:  OT Short Term Goal 1 (Week 2): patient will complete sit pivot or SB transfers with mod A of one OT Short Term Goal 2 (Week 2): patient will complete side lying to/from sitting with min/mod A of one OT Short Term Goal 3 (Week 2): patient will complete UB bathing/dressing with set up OT Short Term Goal 4 (Week 2): patient will complete LB bathing and dressing with mod A using AD's  Skilled Therapeutic Interventions/Progress Updates:    Patient seated in w/c, aunt and friend present for session.   Patient notes pain in lower back - repositioned t/o session with some relief.  She is able to scoop and spear food, bring to mouth with built up handled fork - min cues for attention to pocketing, clearing mouth prior to taking another bite.  Some loss of food but improving.  Hand hygiene with set up.  She demonstrates improved awareness but continues with limited attention to task.  SB transfer w/c to bed mod A of one with stand by of 2nd.  Completed unsupported sitting with focus on upright posture, scapular mobility, lateral and forward reach - good carryover of back precautions.  Max A of 2 for sit to supine due to fatigue.   Completed bilateral UE NMRE in supine position shoulder/scapula to hands with excellent effort and improving mobility noted.  Utilized theraputty and completed bilateral LB AROM and positioning in bed.  She remained in bed at close of session, bed alarm set and call bell in hand.    Therapy Documentation Precautions:  Precautions Precautions: Fall,Back Restrictions Weight Bearing Restrictions: No Other Position/Activity Restrictions: soinal  Therapy/Group: Individual Therapy  Carlos Levering 07/10/2020, 9:05 AM

## 2020-07-10 NOTE — Progress Notes (Signed)
IV team assessed pt for new access. Pt doesn't have anything to continue peripheral IV. They are going to attempt to place a midline but pt has to be in bed for procedure. Will issue order for iv team once pt returns to bed.

## 2020-07-10 NOTE — Progress Notes (Signed)
PROGRESS NOTE   Subjective/Complaints: No complaints this morning Aunt is at bedside Discussed her d/c date of 3/21 and she is fine with this Her aunt asks about her right arm recovery  ROS: Denies CP, shortness of breath, nausea, vomiting, diarrhea.  Objective:   No results found. No results for input(s): WBC, HGB, HCT, PLT in the last 72 hours. No results for input(s): NA, K, CL, CO2, GLUCOSE, BUN, CREATININE, CALCIUM in the last 72 hours.  Intake/Output Summary (Last 24 hours) at 07/10/2020 1243 Last data filed at 07/10/2020 0801 Gross per 24 hour  Intake 360 ml  Output --  Net 360 ml        Physical Exam: Vital Signs Blood pressure (!) 153/90, pulse 88, temperature 97.8 F (36.6 C), temperature source Oral, resp. rate 18, height 6' (1.829 m), weight 99.7 kg, SpO2 100 %. Gen: no distress, normal appearing HEENT: oral mucosa pink and moist, NCAT Cardio: Reg rate Chest: normal effort, normal rate of breathing Abd: soft, non-distended Ext: no edema Psych: Flat.  Slowed. Musc: No edema in extremities.  No tenderness in extremities. Neurologic: Alert Motor: Bilateral upper extremities: 4/5 proximal distal, stable Bilateral lower extremities: Hip flexion 4/5, distally 4+/5 Bilateral upper extremity ataxia, stable  Assessment/Plan: 1. Functional deficits which require 3+ hours per day of interdisciplinary therapy in a comprehensive inpatient rehab setting.  Physiatrist is providing close team supervision and 24 hour management of active medical problems listed below.  Physiatrist and rehab team continue to assess barriers to discharge/monitor patient progress toward functional and medical goals  Care Tool:  Bathing  Bathing activity did not occur: Refused Body parts bathed by patient: Right arm,Left arm,Chest,Abdomen,Front perineal area,Right upper leg,Left upper leg,Face   Body parts bathed by helper:  Buttocks,Right lower leg,Left lower leg     Bathing assist Assist Level: Moderate Assistance - Patient 50 - 74%     Upper Body Dressing/Undressing Upper body dressing Upper body dressing/undressing activity did not occur (including orthotics): Refused What is the patient wearing?: Pull over shirt    Upper body assist Assist Level: Moderate Assistance - Patient 50 - 74%    Lower Body Dressing/Undressing Lower body dressing    Lower body dressing activity did not occur: Refused What is the patient wearing?: Incontinence brief,Pants     Lower body assist Assist for lower body dressing: Maximal Assistance - Patient 25 - 49%     Toileting Toileting Toileting Activity did not occur (Clothing management and hygiene only): Refused  Toileting assist Assist for toileting: Total Assistance - Patient < 25%     Transfers Chair/bed transfer  Transfers assist     Chair/bed transfer assist level: Moderate Assistance - Patient 50 - 74%     Locomotion Ambulation   Ambulation assist      Assist level: 2 helpers Assistive device: Ethelene Hal Max distance: 88ft   Walk 10 feet activity   Assist  Walk 10 feet activity did not occur: Safety/medical concerns        Walk 50 feet activity   Assist Walk 50 feet with 2 turns activity did not occur: Safety/medical concerns         Walk 150  feet activity   Assist Walk 150 feet activity did not occur: Safety/medical concerns         Walk 10 feet on uneven surface  activity   Assist Walk 10 feet on uneven surfaces activity did not occur: Safety/medical concerns         Wheelchair     Assist Will patient use wheelchair at discharge?: Yes Type of Wheelchair: Manual Wheelchair activity did not occur: Safety/medical concerns  Wheelchair assist level: Contact Guard/Touching assist Max wheelchair distance: 36'    Wheelchair 50 feet with 2 turns activity    Assist    Wheelchair 50 feet with 2 turns  activity did not occur: Safety/medical concerns   Assist Level: Contact Guard/Touching assist   Wheelchair 150 feet activity     Assist  Wheelchair 150 feet activity did not occur: Safety/medical concerns       Medical Problem List and Plan: 1.  Decreased functional ability secondary to acute encephalopathy.  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    Continue [name]  2.  Antithrombotics: -DVT/anticoagulation: Continue Lovenox 52.5 mg daily.              -antiplatelet therapy: N/A 3. Pain Management: Oxycodone as needed  Robaxin 500 3 times daily scheduled on 3/12, continue  Controlled with meds on 3/17 4. Mood: Team support             -antipsychotic agents: N/A 5. Neuropsych: This patient is not capable of making decisions on her own behalf. 6. Skin/Wound Care: Routine skin checks 7. Fluids/Electrolytes/Nutrition: Routine in and outs 8.  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  Continue IV Rocephin through 07/23/2020 and follow-up infectious disease. 9.  Diabetes mellitus with hyperglycemia/DKA.  Hemoglobin A1c 8.0.  Check blood sugars before meals and at bedtime  CBG (last 3)  Recent Labs    07/09/20 2116 07/10/20 0606 07/10/20 1202  GLUCAP 78 76 80   Lantus insulin 45 units daily, decreased to 40 on 3/17  3/17: excellent control!- decrease Novolog to 4U TID, start metformin 500mg  daily 10.  CLL.  Follow-up outpatient Dr. Delight Hoh 11.  Hypothyroidism.  Synthroid 12.  Anemia of chronic disease.  Multifactorial.   Hemoglobin 7.6 on 3/14  Continue to monitor 13.  Hypertension.  Dilacor120 mg daily,Micardis 80-25 mg daily           Vitals:   07/09/20 1951 07/10/20 0520  BP: (!) 163/83 (!) 153/90  Pulse: 92 88  Resp:  18 18  Temp: 99.9 F (37.7 C) 97.8 F (36.6 C)  SpO2: 97% 100%   Mild elevated on 3/16 14.  Transaminitis.    ALT elevated, trending down 3/14. 15.  Obesity.  BMI 31.19.  Dietary follow-up 16.  History of asthma.  Continue Singulair 10 mg nightly.   17.  History of genital herpes.  Conitnue Valtrex 18.  Sleep disturbance  Melatonin  ECG reviewed, trazodone 25 nightly started on 3/12  Improving 19.  Slow transit constipation  Bowel meds increased on 3/9, increased again on 3/11  Improving 20.  Hypokalemia  Potassium 3.6 on 3/14 21.  Urinary retention  Flomax increased on 3/11  Continue I/O caths as necessary  Improving  LOS: 13 days A FACE TO FACE EVALUATION WAS PERFORMED  Clide Deutscher Romaine Maciolek 07/10/2020, 12:43 PM

## 2020-07-10 NOTE — Progress Notes (Signed)
SLP Cancellation Note  Patient Details Name: Tonya Myers MRN: [id number] DOB: 1966/02/08   Cancelled treatment:        Patient had just finished OT session and when SLP walked into room, she was mostly asleep and kindly requested to cancel session today.                                                                                             [name], MA, CCC-SLP Speech Therapy

## 2020-07-10 NOTE — Progress Notes (Signed)
Physical Therapy Session Note  Patient Details  Name: Tonya Myers MRN: [id number] Date of Birth: 06-15-1965  Today's Date: 07/10/2020 PT Individual Time: 1045-1200 PT Individual Time Calculation (min): 75 min   Short Term Goals: Week 2:  PT Short Term Goal 1 (Week 2): Patient to perform supine to sit min A. PT Short Term Goal 2 (Week 2): Patient to perform transfers bed<>chair min A. PT Short Term Goal 3 (Week 2): Patient to perform sit to stand with mod to max A of 1. PT Short Term Goal 4 (Week 2): Patient to ambulate with mod A +2 10'. PT Short Term Goal 5 (Week 2): Patient to perform w/c mobility 71' with S.  Skilled Therapeutic Interventions/Progress Updates:    Patient in supine with aunt in the room.  Performed rolling to R min A and side to sit mod A.  Patient transferred to standard w/c with slide board and mod A with cues for forward weight shift and assist for foot placement.  Donned shoes dependent once in chair.  Patient propelled w/c x 60' with CGA and cues for technique on turns.  Pushed in chair to gym and SBT to mat with mod A.  Sit to stand x 3 to EVA walker from elevated mat with +2 assist standing for up to 20 seconds at a time with cues for posture and mirror for improved self monitoring, assist for R knee stabilization.  Patient to supine with mod A.  Supine for therex consisting of bridging w/ 3 sec hold and mod cues for breathing, SAQ x10, hooklying marching x 10, hooklying hip abduction with yellow t-band, bilateral shoulder ER with yellow t-band and A, bilateral horizontal shoulder abduction with yellow t-band and assist x 10.  Patient rolled to R with mod A and side to sit mod A.  Silde board transfer to tilt in space w/c with mod A.  Patient assisted to dayroom and performed 2 minutes on Kinetron with LE's at 40 cm/sec.  Patient pushed to room and left tilted with aunt in the room and tray/call bell in reach.  Educated to return to bed after OT session right after  lunch.   Therapy Documentation Precautions:  Precautions Precautions: Fall,Back Restrictions Weight Bearing Restrictions: No Other Position/Activity Restrictions: soinal Pain: Pain Assessment Faces Pain Scale: Hurts whole lot Pain Type: Acute pain Pain Location: Hip Pain Orientation: Right;Left Pain Descriptors / Indicators: Spasm Pain Onset: With Activity Pain Intervention(s): Repositioned   Therapy/Group: Individual Therapy  Reginia Naas  Magda Kiel, PT 07/10/2020, 11:40 AM

## 2020-07-10 NOTE — Progress Notes (Signed)
Speech Language Pathology Weekly ProgressNote  Patient Details  Name: Tonya Myers MRN: [id number] Date of Birth: [birth year]  Beginning of progress report period: 06/30/2020 End of progress report period: 07/10/2020  Today's Date: 07/10/2020    New Short Term Goals: Week 2: SLP Short Term Goal 1 (Week 2): Patient will demonstrate alternating attention during mild complex level tasks with minA cues. SLP Short Term Goal 2 (Week 2): Patient will demonstrate awareness to errors in task performance with minA cues. SLP Short Term Goal 3 (Week 2): Patient will complete mildly complex information organization task with minA cues. SLP Short Term Goal 4 (Week 2): Patient will demonstrate recall of therapeutic and medical interventions with minA cues.  Weekly Progress Updates:  Patient has demonstrated significant improvement in her overall participation and attention during tasks. She is able to maintain attention and is engaged in cognitive tasks for majority of session duration. She continues with some anxiety but is managing her frustration better, such as instead of refusing or giving up, saying "this is hard!" but continuing to try. She has met 2/6 STG's and has started showing progress towards the goals she did not meet. She continues to benefit from skilled SLP intervention to maximize her cognitive-linguistic abilities prior to discharge.   Intensity: Minumum of 1-2 x/day, 30 to 90 minutes Frequency: 3 to 5 out of 7 days Duration/Length of Stay: 3/31 Treatment/Interventions: Cognitive remediation/compensation;Cueing hierarchy;Functional tasks;Medication managment;Internal/external aids;Patient/family education;Speech/Language facilitation  [name], MA, CCC-SLP Speech Therapy

## 2020-07-10 NOTE — Progress Notes (Signed)
Occupational Therapy Session Note  Patient Details  Name: Tonya Myers MRN: [id number] Date of Birth: Feb 17, 1966  Today's Date: 07/10/2020 OT Individual Time: 0930-1005 OT Individual Time Calculation (min): 35 min    Short Term Goals: Week 2:  OT Short Term Goal 1 (Week 2): patient will complete sit pivot or SB transfers with mod A of one OT Short Term Goal 2 (Week 2): patient will complete side lying to/from sitting with min/mod A of one OT Short Term Goal 3 (Week 2): patient will complete UB bathing/dressing with set up OT Short Term Goal 4 (Week 2): patient will complete LB bathing and dressing with mod A using AD's  Skilled Therapeutic Interventions/Progress Updates:    Pt received in bed with her aunt in the room with her.Pt agreeable to therapy but needed to toilet first. Pt rolled onto side and then pushed up to sit with mod A.   With +2 support, pt used slide board to drop arm BSC with only min A as pt put in excellent effort.  She was able to push up on arms of BSC to enable removal of her brief.   While sitting on BSC pt engaged in UB b/d with min for bthing and mod to don shirt due to weakness in shoulders.  Pt needed increased encouragement to use her arms as she felt they were too weak to use, but once she engaged in tasks she did fairly well.  Used lateral lean for therapist to cleanse bottom, pt did try to cleanse herself and was able to do it partially.   Pt tried to do another push up to have briefs pulled up but unable to lift fully.  Completed board back to bed (used pad on top of board to protect skin). Due to session time being over, NT helped pt complete LB dressing.   Therapy Documentation Precautions:  Precautions Precautions: Fall,Back Restrictions Weight Bearing Restrictions: No Other Position/Activity Restrictions: soinal  Pain: Pain Assessment Faces Pain Scale: Hurts whole lot Pain Type: Acute pain Pain Location: Hip Pain Orientation: Right;Left Pain  Descriptors / Indicators: Spasm Pain Onset: With Activity Pain Intervention(s): Repositioned ADL: ADL Eating: Set up Grooming: Setup Upper Body Bathing: Minimal assistance Lower Body Bathing: Maximal assistance Upper Body Dressing: Moderate assistance Lower Body Dressing: Dependent Toileting: Dependent Where Assessed-Toileting: Bedside Commode Toilet Transfer: Moderate assistance Toilet Transfer Method: Theatre manager: Drop arm bedside commode ADL Comments: Unable to assess any ADL areas due to pts unwillingness to participate/agitation and aggressive behaviors   Therapy/Group: Individual Therapy  [place] 07/10/2020, 12:25 PM

## 2020-07-11 DIAGNOSIS — G9340 Encephalopathy, unspecified: Secondary | ICD-10-CM | POA: Diagnosis not present

## 2020-07-11 LAB — GLUCOSE, CAPILLARY
Glucose-Capillary: 76 mg/dL (ref 70–99)
Glucose-Capillary: 133 mg/dL — ABNORMAL HIGH (ref 70–99)
Glucose-Capillary: 126 mg/dL — ABNORMAL HIGH (ref 70–99)
Glucose-Capillary: 109 mg/dL — ABNORMAL HIGH (ref 70–99)
Glucose-Capillary: 149 mg/dL — ABNORMAL HIGH (ref 70–99)

## 2020-07-11 MED ORDER — METFORMIN HCL 500 MG PO TABS
500.0000 mg | ORAL_TABLET | Freq: Two times a day (BID) | ORAL | Status: DC
Start: 2020-07-11 — End: 2020-07-31
  Administered 2020-07-11 – 2020-07-31 (×40): 500 mg via ORAL
  Filled 2020-07-11 (×40): qty 1

## 2020-07-11 MED ORDER — INSULIN ASPART 100 UNIT/ML ~~LOC~~ SOLN
3.0000 [IU] | Freq: Three times a day (TID) | SUBCUTANEOUS | Status: DC
  Administered 2020-07-11 – 2020-07-22 (×28): 3 [IU] via SUBCUTANEOUS

## 2020-07-11 NOTE — Progress Notes (Signed)
Pt reported not sleeping this am and concerned about burden of care. While with therapy pt reported feeling funny, no c/o chest pain, VS WNL, CBG normal, no difficulty breathing. Encouraged pt to continue with therapy and to move slowly and focus on one object when getting up. Pt very anxious about going home. Encourage to continue moving forward that has improved greatly since admission

## 2020-07-11 NOTE — Progress Notes (Signed)
Occupational Therapy Session Note  Patient Details  Name: Tonya Myers MRN: [id number] Date of Birth: [birth year]  Today's Date: 07/11/2020 OT Individual Time: 1300-1330 OT Individual Time Calculation (min): 30 min    Short Term Goals: Week 2:  OT Short Term Goal 1 (Week 2): patient will complete sit pivot or SB transfers with mod A of one OT Short Term Goal 2 (Week 2): patient will complete side lying to/from sitting with min/mod A of one OT Short Term Goal 3 (Week 2): patient will complete UB bathing/dressing with set up OT Short Term Goal 4 (Week 2): patient will complete LB bathing and dressing with mod A using AD's  Skilled Therapeutic Interventions/Progress Updates:    Pt greeted semi-reclined in bed with spouse present. Pt wanted to work on her hands this afternoon. OT placed small beads in theraputty and worked on finger strength, fine motor pinch, and sensation with theraputty activities. Pt was able to pinch small beads, but had more difficulty with R hand, dropping beads frequently. Worked on coming into long sitting in bed with max A to boost from the back. OT assisted with repositioning pt in bed, then pt left semi-reclined with spouse present and needs met.   Therapy Documentation Precautions:  Precautions Precautions: Fall,Back Restrictions Weight Bearing Restrictions: No Other Position/Activity Restrictions: spinal Pain: Pain Assessment Pain Scale: 0-10 Pain Score: 7  Pain Type: Acute pain Pain Location: Neck Pain Orientation: Posterior Pain Radiating Towards: back Pain Descriptors / Indicators: Spasm;Sharp;Throbbing Pain Frequency: Constant Pain Onset: On-going Pain Intervention(s): Repositioned  Therapy/Group: Individual Therapy  Valma Cava 07/11/2020, 1:38 PM

## 2020-07-11 NOTE — Progress Notes (Signed)
Speech Language Pathology Daily Session Note  Patient Details  Name: Tonya Myers MRN: [id number] Date of Birth: 1965-08-21  Today's Date: 07/11/2020 SLP Individual Time: 1045-1130 SLP Individual Time Calculation (min): 45 min  Short Term Goals: Week 2: SLP Short Term Goal 1 (Week 2): Patient will demonstrate alternating attention during mild complex level tasks with minA cues. SLP Short Term Goal 2 (Week 2): Patient will demonstrate awareness to errors in task performance with minA cues. SLP Short Term Goal 3 (Week 2): Patient will complete mildly complex information organization task with minA cues. SLP Short Term Goal 4 (Week 2): Patient will demonstrate recall of therapeutic and medical interventions with minA cues.  Skilled Therapeutic Interventions:   Patient seen for skilled ST session focusing on cognitive function goals. Patient was able to use SLP's smart phone after SLP setup, to look up medications on drugs.com site to determine purpose of medications she is taking currently. Patient did require min-modA cues to demonstrate ability to summarize or describe purpose of medication in general terms as she would start reading the paragraph describing the medication without demonstrating that she understood. After completing this task, patient did state "now I have a lot of questions" for her nurse. SLP encouraged patient to talk to her nurse about medications. Patient continues to benefit from skilled SLP intervention to maximize cognitive-linguistic functioning prior to discharge.  Pain Pain Assessment Pain Scale: 0-10 Pain Score: 3  Pain Type: Acute pain Pain Location: Neck Pain Descriptors / Indicators: Aching Pain Onset: On-going Pain Intervention(s): Repositioned  Therapy/Group: Individual Therapy  [name], MA, CCC-SLP Speech Therapy

## 2020-07-11 NOTE — Progress Notes (Signed)
Occupational Therapy Session Note  Patient Details  Name: Tonya Myers MRN: [id number] Date of Birth: [birth year]  Today's Date: 07/11/2020 OT Individual Time: 1500-1600 OT Individual Time Calculation (min): 60 min    Short Term Goals: Week 2:  OT Short Term Goal 1 (Week 2): patient will complete sit pivot or SB transfers with mod A of one OT Short Term Goal 2 (Week 2): patient will complete side lying to/from sitting with min/mod A of one OT Short Term Goal 3 (Week 2): patient will complete UB bathing/dressing with set up OT Short Term Goal 4 (Week 2): patient will complete LB bathing and dressing with mod A using AD's  Skilled Therapeutic Interventions/Progress Updates:    1:1. Pt received in bed agreeable to Ot. Pt requesting to work on rolling and agreeable to go to gym. Pt requires MOD A into/OOB using SB and VC for hand placement/headhips relationship with 1 LOB laterally. Pt transfers onto/off of mat with SB with CGA!!! Pt very proud of self and transfers. Pt requires A for LE up onto mat. Pt practices rolling from hooklying and with SL bent. Able to complete with S in B directions using either technique. Pt returns to chair and completes 2x1 min beach ball volley with 1# dowel rod for BUE strengthening coordination and endurance. Exited session with pt seated in bed, exit alarm on and call light in reach   Therapy Documentation Precautions:  Precautions Precautions: Fall,Back Restrictions Weight Bearing Restrictions: No Other Position/Activity Restrictions: spinal General:   Vital Signs: Therapy Vitals Temp: 97.8 F (36.6 C) Pulse Rate: 87 Resp: 18 BP: (!) 145/75 Patient Position (if appropriate): Lying Oxygen Therapy SpO2: 97 % O2 Device: Room Air Pain: Pain Assessment Pain Score: Asleep ADL: ADL Eating: Set up Grooming: Setup Upper Body Bathing: Minimal assistance Lower Body Bathing: Maximal assistance Upper Body Dressing: Moderate assistance Lower Body  Dressing: Dependent Toileting: Dependent Where Assessed-Toileting: Bedside Commode Toilet Transfer: Moderate assistance Toilet Transfer Method: Theatre manager: Drop arm bedside commode ADL Comments: Unable to assess any ADL areas due to pts unwillingness to participate/agitation and aggressive behaviors Vision   Perception    Praxis   Exercises:   Other Treatments:     Therapy/Group: Individual Therapy  [name] 07/11/2020, 6:39 AM

## 2020-07-11 NOTE — Progress Notes (Signed)
Physical Therapy Session Note  Patient Details  Name: Tonya Myers MRN: [id number] Date of Birth: Oct 03, 1965  Today's Date: 07/11/2020 PT Individual Time: 0800-0900 PT Individual Time Calculation (min): 60 min   Short Term Goals: Week 1:  PT Short Term Goal 1 (Week 1): Pt will perform supine to sit w/mod assist of 1 PT Short Term Goal 1 - Progress (Week 1): Met PT Short Term Goal 2 (Week 1): Pt will perform sit to stand w/mod assist of 2 consistently PT Short Term Goal 2 - Progress (Week 1): Met PT Short Term Goal 3 (Week 1): Pt will transfer bed to/from wc w/mod assist of 2 PT Short Term Goal 3 - Progress (Week 1): Met PT Short Term Goal 4 (Week 1): Pt will tolerate OOB in wc for at least 1 hr at a time PT Short Term Goal 4 - Progress (Week 1): Met Week 2:  PT Short Term Goal 1 (Week 2): Patient to perform supine to sit min A. PT Short Term Goal 2 (Week 2): Patient to perform transfers bed<>chair min A. PT Short Term Goal 3 (Week 2): Patient to perform sit to stand with mod to max A of 1. PT Short Term Goal 4 (Week 2): Patient to ambulate with mod A +2 10'. PT Short Term Goal 5 (Week 2): Patient to perform w/c mobility 38' with S. Week 3:     Skilled Therapeutic Interventions/Progress Updates:    pt received in bed and agreeable to therapy. Pt denied pain, nursing present for medications. Pt directed in donning pants at bed level max A to complete. Pt then directed in supine>sit at max A x2 with poor trunk control noted. Pt then required max A initially for balance at sitting, improved to min A with noted trunk sway in all directions. Pt then directed in doffing sleep shirt and donning clean shirt at min A. Pt attempting to rush getting ready to then transfer OOB, pt then reported she felt like she was dizzy and that PT and therapist voices, "feel far away". PT asked tech to retreive BP monitor, BP taken in sitting to be 139/80 and HR 101. Pt directed in sit>supine mod A for BLE  management and max A for upright scooting for improved position. Pt reported feeling better shortly after this. NCT present to take pt's blood sugar, 133. With rest break in supine, pt reported feeling much better and requested to return to sitting EOB, mod A of one to complete. Pt required min A-max A for sitting balance for 15 mins at EOB and had many questions about attempting therapy in sitting and got emotional reporting, "I don't want to miss a day. I want to do therapy." PT provided emotional support and educated pt on ability to participate in therapy without leaving room. Pt agreed and directed in seated balance training with reaching for items on tray table and placing them at various trageted areas with pt able to complete with min A trunk support and mulitple rests breaks. Pt requred x2 rest breaks leaning against HOB. Nursing present to assess pt and cleared to continue to participate in therapy. Post activity in sitting, Pt then directed in sit>supine mod A. Pt left in supine, All needs in reach and in good condition. Call light in hand.  And alarm set, visitor present.   Therapy Documentation Precautions:  Precautions Precautions: Fall,Back Restrictions Weight Bearing Restrictions: No Other Position/Activity Restrictions: spinal General:   Vital Signs:  Pain: Mobility:   Locomotion :  Trunk/Postural Assessment :    Balance:   Exercises:   Other Treatments:      Therapy/Group: Individual Therapy  Junie Panning 07/11/2020, 12:16 PM

## 2020-07-11 NOTE — Progress Notes (Signed)
PROGRESS NOTE   Subjective/Complaints: No complaints this morning.  Feels therapy is progressing well. BP elevated and tachycardic  ROS: Denies CP, shortness of breath, nausea, vomiting, diarrhea.  Objective:   No results found. No results for input(s): WBC, HGB, HCT, PLT in the last 72 hours. No results for input(s): NA, K, CL, CO2, GLUCOSE, BUN, CREATININE, CALCIUM in the last 72 hours.  Intake/Output Summary (Last 24 hours) at 07/11/2020 1346 Last data filed at 07/11/2020 1337 Gross per 24 hour  Intake 720 ml  Output --  Net 720 ml        Physical Exam: Vital Signs Blood pressure (!) 147/82, pulse (!) 102, temperature 99.6 F (37.6 C), resp. rate 15, height 6' (1.829 m), weight 99.7 kg, SpO2 99 %. Gen: no distress, normal appearing HEENT: oral mucosa pink and moist, NCAT Cardio: Tachycardic Chest: normal effort, normal rate of breathing Abd: soft, non-distended Ext: no edema Psych: pleasant, normal affect Musc: No edema in extremities.  No tenderness in extremities. Neurologic: Alert Motor: Bilateral upper extremities: 4/5 proximal distal, stable Bilateral lower extremities: Hip flexion 4/5, distally 4+/5 Bilateral upper extremity ataxia, stable  Assessment/Plan: 1. Functional deficits which require 3+ hours per day of interdisciplinary therapy in a comprehensive inpatient rehab setting.  Physiatrist is providing close team supervision and 24 hour management of active medical problems listed below.  Physiatrist and rehab team continue to assess barriers to discharge/monitor patient progress toward functional and medical goals  Care Tool:  Bathing  Bathing activity did not occur: Refused Body parts bathed by patient: Right arm,Left arm,Chest,Abdomen,Front perineal area,Right upper leg,Left upper leg,Face   Body parts bathed by helper: Buttocks,Right lower leg,Left lower leg     Bathing assist Assist  Level: Moderate Assistance - Patient 50 - 74%     Upper Body Dressing/Undressing Upper body dressing Upper body dressing/undressing activity did not occur (including orthotics): Refused What is the patient wearing?: Pull over shirt    Upper body assist Assist Level: Moderate Assistance - Patient 50 - 74%    Lower Body Dressing/Undressing Lower body dressing    Lower body dressing activity did not occur: Refused What is the patient wearing?: Incontinence brief,Pants     Lower body assist Assist for lower body dressing: Maximal Assistance - Patient 25 - 49%     Toileting Toileting Toileting Activity did not occur (Clothing management and hygiene only): Refused  Toileting assist Assist for toileting: Total Assistance - Patient < 25%     Transfers Chair/bed transfer  Transfers assist     Chair/bed transfer assist level: Moderate Assistance - Patient 50 - 74%     Locomotion Ambulation   Ambulation assist      Assist level: 2 helpers Assistive device: Ethelene Hal Max distance: 22ft   Walk 10 feet activity   Assist  Walk 10 feet activity did not occur: Safety/medical concerns        Walk 50 feet activity   Assist Walk 50 feet with 2 turns activity did not occur: Safety/medical concerns         Walk 150 feet activity   Assist Walk 150 feet activity did not occur: Safety/medical concerns  Walk 10 feet on uneven surface  activity   Assist Walk 10 feet on uneven surfaces activity did not occur: Safety/medical concerns         Wheelchair     Assist Will patient use wheelchair at discharge?: Yes Type of Wheelchair: Manual Wheelchair activity did not occur: Safety/medical concerns  Wheelchair assist level: Contact Guard/Touching assist Max wheelchair distance: 86'    Wheelchair 50 feet with 2 turns activity    Assist    Wheelchair 50 feet with 2 turns activity did not occur: Safety/medical concerns   Assist Level:  Contact Guard/Touching assist   Wheelchair 150 feet activity     Assist  Wheelchair 150 feet activity did not occur: Safety/medical concerns       Medical Problem List and Plan: 1.  Decreased functional ability secondary to acute encephalopathy.  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    Continue [name]  2.  Antithrombotics: -DVT/anticoagulation: Continue Lovenox 52.5 mg daily.              -antiplatelet therapy: N/A 3. Pain Management: Oxycodone as needed  Robaxin 500 3 times daily scheduled on 3/12, continue  Controlled with meds on 3/18 4. Mood: Team support             -antipsychotic agents: N/A 5. Neuropsych: This patient is not capable of making decisions on her own behalf. 6. Skin/Wound Care: Routine skin checks 7. Fluids/Electrolytes/Nutrition: Routine in and outs 8.  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  Continue IV Rocephin through 07/23/2020 and follow-up infectious disease. 9.  Diabetes mellitus with hyperglycemia/DKA.  Hemoglobin A1c 8.0.  Check blood sugars before meals and at bedtime  CBG (last 3)  Recent Labs    07/11/20 0615 07/11/20 0829 07/11/20 1137  GLUCAP 76 133* 126*   Lantus insulin 45 units daily, decreased to 40 on 3/16  3/17: excellent control!- decrease Novolog to 4U TID, Cr normal 3/14, start metformin 500mg  daily  3/18: CBGs 76-135: decrease Novolog to 3U TID, and increase Metformin to 500mg  BID. Repeat creatinine Monday. 10.  CLL.  Follow-up outpatient Dr. Delight Hoh 11.  Hypothyroidism.  Synthroid 12.  Anemia of chronic disease.  Multifactorial.   Hemoglobin 7.6 on 3/14  Continue to monitor 13.  Hypertension.  Dilacor120 mg daily,Micardis 80-25 mg daily           Vitals:   07/11/20 0411  07/11/20 1335  BP: (!) 145/75 (!) 147/82  Pulse: 87 (!) 102  Resp: 18 15  Temp: 97.8 F (36.6 C) 99.6 F (37.6 C)  SpO2: 97% 29%   5/62: systolic mildly elevated, diastolic labile- continue current regimen. 14.  Transaminitis.    ALT elevated, trending down 3/14. 15.  Obesity.  BMI 31.19.  Dietary follow-up 16.  History of asthma.  Continue Singulair 10 mg nightly.   17.  History of genital herpes.  Conitnue Valtrex 18.  Sleep disturbance  Melatonin  ECG reviewed, trazodone 25 nightly started on 3/12  Improving 19.  Slow transit constipation  Bowel meds increased on 3/9, increased again on 3/11  Improving 20.  Hypokalemia  Potassium 3.6 on 3/14 21.  Urinary retention  Flomax increased on 3/11  Continue I/O caths as necessary  Improving 22. Tachycardic: transient- continue to monitor HR TID.  LOS: 14 days A FACE TO FACE EVALUATION WAS  PERFORMED  Izora Ribas 07/11/2020, 1:46 PM

## 2020-07-12 DIAGNOSIS — G9340 Encephalopathy, unspecified: Secondary | ICD-10-CM | POA: Diagnosis not present

## 2020-07-12 DIAGNOSIS — I10 Essential (primary) hypertension: Secondary | ICD-10-CM | POA: Diagnosis not present

## 2020-07-12 DIAGNOSIS — E119 Type 2 diabetes mellitus without complications: Secondary | ICD-10-CM | POA: Diagnosis not present

## 2020-07-12 LAB — GLUCOSE, CAPILLARY
Glucose-Capillary: 78 mg/dL (ref 70–99)
Glucose-Capillary: 122 mg/dL — ABNORMAL HIGH (ref 70–99)
Glucose-Capillary: 124 mg/dL — ABNORMAL HIGH (ref 70–99)
Glucose-Capillary: 166 mg/dL — ABNORMAL HIGH (ref 70–99)

## 2020-07-12 NOTE — Progress Notes (Signed)
Spoke with [facility] staff and relayed message to the RN that she or he can discontinue fluids through the midline/which is treated the same as a PIV.

## 2020-07-12 NOTE — Progress Notes (Signed)
Occupational Therapy Session Note  Patient Details  Name: Tonya Myers MRN: [id number] Date of Birth: 1965/05/03  Today's Date: 07/12/2020 OT Individual Time: 1415-1445 OT Individual Time Calculation (min): 30 min    Short Term Goals: Week 2:  OT Short Term Goal 1 (Week 2): patient will complete sit pivot or SB transfers with mod A of one OT Short Term Goal 2 (Week 2): patient will complete side lying to/from sitting with min/mod A of one OT Short Term Goal 3 (Week 2): patient will complete UB bathing/dressing with set up OT Short Term Goal 4 (Week 2): patient will complete LB bathing and dressing with mod A using AD's  Skilled Therapeutic Interventions/Progress Updates:    Treatment session with focus on trunk control, sitting balance, and BUE endurance.  Pt received supine in bed having just completed toileting with nursing staff.  Pt reports fatigued from all the rolling to utilize bed pan and reports pain in L hip from transfers yesterday - RN aware.  Pt requested to remain in room for therapy session.  Completed bed mobility with mod assist to sit to EOB.  Engaged in ball toss/catch with rehab tech while therapist provided min-mod assist for sitting balance due to heavy posterior lean.  Pt required cues for trunk control to improve sitting balance.  Pt requested to return to sidelying.  Engaged in rolling to improve positioning.  Engaged in LUE fine motor control in sidelying with picking up small foam blocks and placing in cup at various planes to challenge motor control with reaching and release.  Educated on various activities and exercises she can complete at bed level when fatigued and on days with no therapy scheduled.  Pt requested to end session early, remained in sidelying with all needs in reach.    Therapy Documentation Precautions:  Precautions Precautions: Fall,Back Restrictions Weight Bearing Restrictions: No Other Position/Activity Restrictions:  spinal General: General OT Amount of Missed Time: 15 Minutes Vital Signs: Therapy Vitals Temp: 98.7 F (37.1 C) Temp Source: Oral Pulse Rate: 99 Resp: 16 BP: 137/76 Patient Position (if appropriate): Lying Oxygen Therapy SpO2: 98 % O2 Device: Room Air Pain:   Pt with c/o pain in L hip.  Repositioned.  Therapy/Group: Individual Therapy  Simonne Come 07/12/2020, 3:06 PM

## 2020-07-12 NOTE — Progress Notes (Signed)
PROGRESS NOTE   Subjective/Complaints: No major issues. Feels that her hands are weaker this morning. Denies pain.   ROS: Patient denies fever, rash, sore throat, blurred vision, nausea, vomiting, diarrhea, cough, shortness of breath or chest pain, joint or back pain, headache, or mood change.   .  Objective:   No results found. No results for input(s): WBC, HGB, HCT, PLT in the last 72 hours. No results for input(s): NA, K, CL, CO2, GLUCOSE, BUN, CREATININE, CALCIUM in the last 72 hours.  Intake/Output Summary (Last 24 hours) at 07/12/2020 1428 Last data filed at 07/12/2020 0809 Gross per 24 hour  Intake 240 ml  Output --  Net 240 ml        Physical Exam: Vital Signs Blood pressure 137/76, pulse 99, temperature 98.7 F (37.1 C), temperature source Oral, resp. rate 16, height 6' (1.829 m), weight 99.7 kg, SpO2 98 %. Constitutional: No distress . Vital signs reviewed. HEENT: EOMI, oral membranes moist Neck: supple Cardiovascular: RRR without murmur. No JVD    Respiratory/Chest: CTA Bilaterally without wheezes or rales. Normal effort    GI/Abdomen: BS +, non-tender, non-distended Ext: no clubbing, cyanosis, or edema Psych: pleasant and cooperative Musc: No edema in extremities.  No tenderness in extremities. Neurologic: Alert Motor: Bilateral upper extremities: 4/5 proximal distal, grip 4-  Bilateral lower extremities: Hip flexion 4/5, distally 4+/5 Bilateral upper extremity ataxia ongoing  Assessment/Plan: 1. Functional deficits which require 3+ hours per day of interdisciplinary therapy in a comprehensive inpatient rehab setting.  Physiatrist is providing close team supervision and 24 hour management of active medical problems listed below.  Physiatrist and rehab team continue to assess barriers to discharge/monitor patient progress toward functional and medical goals  Care Tool:  Bathing  Bathing activity  did not occur: Refused Body parts bathed by patient: Right arm,Left arm,Chest,Abdomen,Front perineal area,Right upper leg,Left upper leg,Face   Body parts bathed by helper: Buttocks,Right lower leg,Left lower leg     Bathing assist Assist Level: Moderate Assistance - Patient 50 - 74%     Upper Body Dressing/Undressing Upper body dressing Upper body dressing/undressing activity did not occur (including orthotics): Refused What is the patient wearing?: Pull over shirt    Upper body assist Assist Level: Moderate Assistance - Patient 50 - 74%    Lower Body Dressing/Undressing Lower body dressing    Lower body dressing activity did not occur: Refused What is the patient wearing?: Incontinence brief,Pants     Lower body assist Assist for lower body dressing: Maximal Assistance - Patient 25 - 49%     Toileting Toileting Toileting Activity did not occur (Clothing management and hygiene only): Refused  Toileting assist Assist for toileting: Total Assistance - Patient < 25%     Transfers Chair/bed transfer  Transfers assist     Chair/bed transfer assist level: Moderate Assistance - Patient 50 - 74%     Locomotion Ambulation   Ambulation assist      Assist level: 2 helpers Assistive device: Ethelene Hal Max distance: 60ft   Walk 10 feet activity   Assist  Walk 10 feet activity did not occur: Safety/medical concerns        Walk 50 feet  activity   Assist Walk 50 feet with 2 turns activity did not occur: Safety/medical concerns         Walk 150 feet activity   Assist Walk 150 feet activity did not occur: Safety/medical concerns         Walk 10 feet on uneven surface  activity   Assist Walk 10 feet on uneven surfaces activity did not occur: Safety/medical concerns         Wheelchair     Assist Will patient use wheelchair at discharge?: Yes Type of Wheelchair: Manual Wheelchair activity did not occur: Safety/medical concerns  Wheelchair  assist level: Contact Guard/Touching assist Max wheelchair distance: 30'    Wheelchair 50 feet with 2 turns activity    Assist    Wheelchair 50 feet with 2 turns activity did not occur: Safety/medical concerns   Assist Level: Contact Guard/Touching assist   Wheelchair 150 feet activity     Assist  Wheelchair 150 feet activity did not occur: Safety/medical concerns       Medical Problem List and Plan: 1.  Decreased functional ability secondary to acute encephalopathy.  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    -Continue [name] therapies including PT, OT, and SLP   -making steady gains in strength and stamina 2.  Antithrombotics: -DVT/anticoagulation: Continue Lovenox 52.5 mg daily.              -antiplatelet therapy: N/A 3. Pain Management: Oxycodone as needed  Robaxin 500 3 times daily scheduled on 3/12, continue  Controlled with meds on 3/18 4. Mood: Team support             -antipsychotic agents: N/A 5. Neuropsych: This patient is not capable of making decisions on her own behalf. 6. Skin/Wound Care: Routine skin checks 7. Fluids/Electrolytes/Nutrition: Routine in and outs 8.  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  Continue IV Rocephin through 07/23/2020 and follow-up infectious disease. 9.  Diabetes mellitus with hyperglycemia/DKA.  Hemoglobin A1c 8.0.  Check blood sugars before meals and at bedtime  CBG (last 3)  Recent Labs    07/11/20 2114 07/12/20 0551 07/12/20 1159  GLUCAP 149* 78 122*   Lantus insulin 45 units daily, decreased to 40 on 3/16  3/17: excellent control!- decrease Novolog to 4U TID, Cr normal 3/14, start metformin 500mg  daily  3/18: CBGs 76-135: decrease Novolog to 3U TID, and increase Metformin to  500mg  BID.   3/19 AM CBG 78, otherwise improving. Observe today. 10.  CLL.  Follow-up outpatient Dr. Delight Hoh 11.  Hypothyroidism.  Synthroid 12.  Anemia of chronic disease.  Multifactorial.   Hemoglobin 7.6 on 3/14  Continue to monitor 13.  Hypertension.  Dilacor120 mg daily,Micardis 80-25 mg daily           Vitals:   07/12/20 0417 07/12/20 1355  BP: (!) 129/109 137/76  Pulse: 85 99  Resp: 19 16  Temp: 98.9 F (37.2 C) 98.7 F (37.1 C)  SpO2: 96% 98%   3/19 DBP elevated this morning. Otherwise with improvement.    -observe for consistent pattern  14.  Transaminitis.    ALT elevated, trending down 3/14. 15.  Obesity.  BMI 31.19.  Dietary follow-up 16.  History of asthma.  Continue Singulair 10 mg nightly.   17.  History of genital herpes.  Conitnue Valtrex 18.  Sleep  disturbance  Melatonin  ECG reviewed, trazodone 25 nightly started on 3/12  Improving 19.  Slow transit constipation  Bowel meds increased on 3/9, increased again on 3/11  Large bm this morning 20.  Hypokalemia  Potassium 3.6 on 3/14 21.  Urinary retention  Flomax increased on 3/11  Continue I/O caths as necessary  Emptying now without significant residual 22. Tachycardia: improving  -cardizem  LOS: 15 days A FACE TO FACE EVALUATION WAS PERFORMED  Meredith Staggers 07/12/2020, 2:28 PM

## 2020-07-12 NOTE — Progress Notes (Signed)
Physical Therapy Session Note  Patient Details  Name: Tonya Myers MRN: [id number] Date of Birth: Apr 02, 1966  Today's Date: 07/12/2020 PT Individual Time: 9295-7473 PT Individual Time Calculation (min): 45 min   Short Term Goals: Week 1:  PT Short Term Goal 1 (Week 1): Pt will perform supine to sit w/mod assist of 1 PT Short Term Goal 1 - Progress (Week 1): Met PT Short Term Goal 2 (Week 1): Pt will perform sit to stand w/mod assist of 2 consistently PT Short Term Goal 2 - Progress (Week 1): Met PT Short Term Goal 3 (Week 1): Pt will transfer bed to/from wc w/mod assist of 2 PT Short Term Goal 3 - Progress (Week 1): Met PT Short Term Goal 4 (Week 1): Pt will tolerate OOB in wc for at least 1 hr at a time PT Short Term Goal 4 - Progress (Week 1): Met  Skilled Therapeutic Interventions/Progress Updates:   Pt received supine in bed and agreeable to PT. Rolling R and L to don pants with min assist for roll and max assist for clothing management. Supine>sit transfer with mod assist overall from Pt for BLE positioning and stability of trunk once in sitting. Sitting balance EOB x 2 minutes with CGA from PT. SB transfer to [facility] with mod assist and moderate cues for improved UE placement to stabilize trunk and prevent lateral LOB. WC mobility in WC x 34f and 364fwith supervision assist and cues for improved turning technique through doorway. Sit>stand from Mat table with mod-max assist and BUE support EvHarmon Pieralker. Pt able to stand ~ 1 minutes prior to Bil knee buckling requiring max assist to return to sitting EOB. SB transfer to and from mat table with mod assist from PT for LE positioning and improved anterior weight shift to improve slide.  UBE 2 min forward/reverse with low level random resistance. Pt returned to room and performed SB transfer to bed with Mod assist. Sit>supine completed with max assist for BLE and trunk control. Pt  left supine in bed with call bell in reach and all needs met.        Therapy Documentation Precautions:  Precautions Precautions: Fall,Back Restrictions Weight Bearing Restrictions: No Other Position/Activity Restrictions: spinal GPain:   7/10 Neck. Aching. Pt repositioned.    Therapy/Group: Individual Therapy  AuLorie Phenix/19/2022, 10:06 AM

## 2020-07-13 DIAGNOSIS — G9340 Encephalopathy, unspecified: Secondary | ICD-10-CM | POA: Diagnosis not present

## 2020-07-13 DIAGNOSIS — E119 Type 2 diabetes mellitus without complications: Secondary | ICD-10-CM | POA: Diagnosis not present

## 2020-07-13 DIAGNOSIS — I10 Essential (primary) hypertension: Secondary | ICD-10-CM | POA: Diagnosis not present

## 2020-07-13 LAB — GLUCOSE, CAPILLARY
Glucose-Capillary: 114 mg/dL — ABNORMAL HIGH (ref 70–99)
Glucose-Capillary: 142 mg/dL — ABNORMAL HIGH (ref 70–99)
Glucose-Capillary: 108 mg/dL — ABNORMAL HIGH (ref 70–99)
Glucose-Capillary: 89 mg/dL (ref 70–99)

## 2020-07-13 NOTE — Progress Notes (Signed)
PROGRESS NOTE   Subjective/Complaints: No new complaints. Happy to get a day of rest today!  ROS: Patient denies fever, rash, sore throat, blurred vision, nausea, vomiting, diarrhea, cough, shortness of breath or chest pain, joint or back pain, headache, or mood change.  .  Objective:   No results found. No results for input(s): WBC, HGB, HCT, PLT in the last 72 hours. No results for input(s): NA, K, CL, CO2, GLUCOSE, BUN, CREATININE, CALCIUM in the last 72 hours.  Intake/Output Summary (Last 24 hours) at 07/13/2020 1029 Last data filed at 07/13/2020 0834 Gross per 24 hour  Intake 580 ml  Output --  Net 580 ml        Physical Exam: Vital Signs Blood pressure (!) 146/83, pulse 85, temperature 98.8 F (37.1 C), resp. rate 18, height 6' (1.829 m), weight 99.7 kg, SpO2 97 %. Constitutional: No distress . Vital signs reviewed. HEENT: EOMI, oral membranes moist Neck: supple Cardiovascular: RRR without murmur. No JVD    Respiratory/Chest: CTA Bilaterally without wheezes or rales. Normal effort    GI/Abdomen: BS +, non-tender, non-distended Ext: no clubbing, cyanosis, or edema Psych: pleasant and cooperative Musc: No edema in extremities.  No tenderness in extremities. Neurologic: Alert Motor: Bilateral upper extremities: 4/5 proximal distal, grip 4-  Bilateral lower extremities: Hip flexion 4/5, distally 4+/5 Bilateral upper extremity ataxia ongoing  Assessment/Plan: 1. Functional deficits which require 3+ hours per day of interdisciplinary therapy in a comprehensive inpatient rehab setting.  Physiatrist is providing close team supervision and 24 hour management of active medical problems listed below.  Physiatrist and rehab team continue to assess barriers to discharge/monitor patient progress toward functional and medical goals  Care Tool:  Bathing  Bathing activity did not occur: Refused Body parts bathed by  patient: Right arm,Left arm,Chest,Abdomen,Front perineal area,Right upper leg,Left upper leg,Face   Body parts bathed by helper: Buttocks,Right lower leg,Left lower leg     Bathing assist Assist Level: Moderate Assistance - Patient 50 - 74%     Upper Body Dressing/Undressing Upper body dressing Upper body dressing/undressing activity did not occur (including orthotics): Refused What is the patient wearing?: Pull over shirt    Upper body assist Assist Level: Moderate Assistance - Patient 50 - 74%    Lower Body Dressing/Undressing Lower body dressing    Lower body dressing activity did not occur: Refused What is the patient wearing?: Incontinence brief,Pants     Lower body assist Assist for lower body dressing: Maximal Assistance - Patient 25 - 49%     Toileting Toileting Toileting Activity did not occur (Clothing management and hygiene only): Refused  Toileting assist Assist for toileting: Total Assistance - Patient < 25%     Transfers Chair/bed transfer  Transfers assist     Chair/bed transfer assist level: Moderate Assistance - Patient 50 - 74%     Locomotion Ambulation   Ambulation assist      Assist level: 2 helpers Assistive device: Ethelene Hal Max distance: 31ft   Walk 10 feet activity   Assist  Walk 10 feet activity did not occur: Safety/medical concerns        Walk 50 feet activity   Assist Walk 50  feet with 2 turns activity did not occur: Safety/medical concerns         Walk 150 feet activity   Assist Walk 150 feet activity did not occur: Safety/medical concerns         Walk 10 feet on uneven surface  activity   Assist Walk 10 feet on uneven surfaces activity did not occur: Safety/medical concerns         Wheelchair     Assist Will patient use wheelchair at discharge?: Yes Type of Wheelchair: Manual Wheelchair activity did not occur: Safety/medical concerns  Wheelchair assist level: Contact Guard/Touching  assist Max wheelchair distance: 62'    Wheelchair 50 feet with 2 turns activity    Assist    Wheelchair 50 feet with 2 turns activity did not occur: Safety/medical concerns   Assist Level: Contact Guard/Touching assist   Wheelchair 150 feet activity     Assist  Wheelchair 150 feet activity did not occur: Safety/medical concerns       Medical Problem List and Plan: 1.  Decreased functional ability secondary to acute encephalopathy.  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    -Continue [name] therapies including PT, OT, and SLP   -making steady gains in strength and stamina 2.  Antithrombotics: -DVT/anticoagulation: Continue Lovenox 52.5 mg daily.              -antiplatelet therapy: N/A 3. Pain Management: Oxycodone as needed  Robaxin 500 3 times daily scheduled on 3/12, continue  Controlled with meds on 3/20 4. Mood: Team support             -antipsychotic agents: N/A 5. Neuropsych: This patient is not capable of making decisions on her own behalf. 6. Skin/Wound Care: Routine skin checks 7. Fluids/Electrolytes/Nutrition: Routine in and outs 8.  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  Continue IV Rocephin through 07/23/2020 and follow-up infectious disease.  -low grade temp still 9.  Diabetes mellitus with hyperglycemia/DKA.  Hemoglobin A1c 8.0.  Check blood sugars before meals and at bedtime  CBG (last 3)  Recent Labs    07/12/20 1646 07/12/20 2124 07/13/20 0611  GLUCAP 124* 166* 114*   Lantus insulin 45 units daily, decreased to 40 on 3/16  3/17: excellent control!- decrease Novolog to 4U TID, Cr normal 3/14, start metformin 500mg  daily  3/18: CBGs 76-135: decrease Novolog to 3U TID, and increase Metformin to 500mg  BID.    3/19-20 fair control of cbg's--no changes 10.  CLL.  Follow-up outpatient Dr. Delight Hoh 11.  Hypothyroidism.  Synthroid 12.  Anemia of chronic disease.  Multifactorial.   Hemoglobin 7.6 on 3/14  Continue to monitor 13.  Hypertension.  Dilacor120 mg daily,Micardis 80-25 mg daily           Vitals:   07/12/20 1916 07/13/20 0352  BP: (!) 162/74 (!) 146/83  Pulse: 92 85  Resp: 18 18  Temp: 99.8 F (37.7 C) 98.8 F (37.1 C)  SpO2: 96% 97%   3/20 DBP with improvement. No chagnes today 14.  Transaminitis.    ALT elevated, trending down 3/14. 15.  Obesity.  BMI 31.19.  Dietary follow-up 16.  History of asthma.  Continue Singulair 10 mg nightly.   17.  History of genital herpes.  Conitnue Valtrex 18.  Sleep disturbance  Melatonin  ECG reviewed, trazodone 25 nightly started on  3/12  Improved 19.  Slow transit constipation  Bowel meds increased on 3/9, increased again on 3/11  Large bm this morning 20.  Hypokalemia  Potassium 3.6 on 3/14 21.  Urinary retention  Flomax increased on 3/11  Continue I/O caths as necessary  Emptying now without significant residual 22. Tachycardia: improving  -cardizem  LOS: 16 days A FACE TO FACE EVALUATION WAS PERFORMED  Meredith Staggers 07/13/2020, 10:29 AM

## 2020-07-14 DIAGNOSIS — I10 Essential (primary) hypertension: Secondary | ICD-10-CM | POA: Diagnosis not present

## 2020-07-14 DIAGNOSIS — G061 Intraspinal abscess and granuloma: Secondary | ICD-10-CM | POA: Diagnosis not present

## 2020-07-14 DIAGNOSIS — G9340 Encephalopathy, unspecified: Secondary | ICD-10-CM | POA: Diagnosis not present

## 2020-07-14 DIAGNOSIS — D638 Anemia in other chronic diseases classified elsewhere: Secondary | ICD-10-CM | POA: Diagnosis not present

## 2020-07-14 LAB — BASIC METABOLIC PANEL
Anion gap: 9 (ref 5–15)
BUN: <5 mg/dL — ABNORMAL LOW (ref 6–20)
CO2: 25 mmol/L (ref 22–32)
Calcium: 8.6 mg/dL — ABNORMAL LOW (ref 8.9–10.3)
Chloride: 104 mmol/L (ref 98–111)
Creatinine, Ser: 0.55 mg/dL (ref 0.44–1.00)
GFR, Estimated: >60 mL/min (ref >60)
Glucose, Bld: 80 mg/dL (ref 70–99)
Potassium: 3.3 mmol/L — ABNORMAL LOW (ref 3.5–5.1)
Sodium: 138 mmol/L (ref 135–145)

## 2020-07-14 LAB — CBC
HCT: 27.1 % — ABNORMAL LOW (ref 36.0–46.0)
Hemoglobin: 8 g/dL — ABNORMAL LOW (ref 12.0–15.0)
MCH: 25.2 pg — ABNORMAL LOW (ref 26.0–34.0)
MCHC: 29.5 g/dL — ABNORMAL LOW (ref 30.0–36.0)
MCV: 85.2 fL (ref 80.0–100.0)
Platelets: 357 10*3/uL (ref 150–400)
RBC: 3.18 MIL/uL — ABNORMAL LOW (ref 3.87–5.11)
RDW: 18 % — ABNORMAL HIGH (ref 11.5–15.5)
WBC: 6.3 10*3/uL (ref 4.0–10.5)
nRBC: 0 % (ref 0.0–0.2)

## 2020-07-14 LAB — GLUCOSE, CAPILLARY
Glucose-Capillary: 89 mg/dL (ref 70–99)
Glucose-Capillary: 91 mg/dL (ref 70–99)
Glucose-Capillary: 97 mg/dL (ref 70–99)
Glucose-Capillary: 125 mg/dL — ABNORMAL HIGH (ref 70–99)

## 2020-07-14 MED ORDER — POTASSIUM CHLORIDE CRYS ER 20 MEQ PO TBCR
30.0000 meq | EXTENDED_RELEASE_TABLET | Freq: Two times a day (BID) | ORAL | Status: AC
  Administered 2020-07-14 – 2020-07-15 (×4): 30 meq via ORAL
  Filled 2020-07-14 (×4): qty 1

## 2020-07-14 MED ORDER — SENNOSIDES-DOCUSATE SODIUM 8.6-50 MG PO TABS
1.0000 | ORAL_TABLET | Freq: Every day | ORAL | Status: DC
  Administered 2020-07-14 – 2020-07-30 (×16): 1 via ORAL
  Filled 2020-07-14 (×16): qty 1

## 2020-07-14 MED ORDER — POLYETHYLENE GLYCOL 3350 17 G PO PACK
17.0000 g | PACK | Freq: Every day | ORAL | Status: DC
  Administered 2020-07-17 – 2020-07-31 (×14): 17 g via ORAL
  Filled 2020-07-14 (×16): qty 1

## 2020-07-14 MED ORDER — INSULIN GLARGINE 100 UNIT/ML ~~LOC~~ SOLN
35.0000 [IU] | Freq: Every day | SUBCUTANEOUS | Status: DC
  Administered 2020-07-15 – 2020-07-31 (×17): 35 [IU] via SUBCUTANEOUS
  Filled 2020-07-14 (×17): qty 0.35

## 2020-07-14 NOTE — Progress Notes (Signed)
Physical Therapy Weekly Progress Note  Patient Details  Name: Tonya Myers MRN: [id number] Date of Birth: Aug 31, 1965  Beginning of progress report period: July 07, 2020 End of progress report period: July 14, 2020  Today's Date: 07/14/2020 PT Individual Time: 2637-8588 PT Individual Time Calculation (min): 64 min   Patient has met 2 of 5 short term goals.  Patient has improved her transfers to mod A of 1, and min A for bed mobility.  She has been able to stand with +2 A from an elevated surface, but has limited tolerance in standing due to UE fatigue needing lot of UE support.  She has not been able to take steps at all due to numbness in hands and feet and limited core strength, balance, etc.  She will continue to progress, however, with transfers and strength and w/c mobility with continued skilled PT.  Patient continues to demonstrate the following deficits muscle weakness, muscle joint tightness and muscle paralysis and impaired timing and sequencing, ataxia and decreased coordination and therefore will continue to benefit from skilled PT intervention to increase functional independence with mobility.  Patient progressing toward long term goals..  Continue plan of care.  PT Short Term Goals Week 2:  PT Short Term Goal 1 (Week 2): Patient to perform supine to sit min A. PT Short Term Goal 1 - Progress (Week 2): Met PT Short Term Goal 2 (Week 2): Patient to perform transfers bed<>chair min A. PT Short Term Goal 2 - Progress (Week 2): Not met PT Short Term Goal 3 (Week 2): Patient to perform sit to stand with mod to max A of 1. PT Short Term Goal 3 - Progress (Week 2): Not met PT Short Term Goal 4 (Week 2): Patient to ambulate with mod A +2 10'. PT Short Term Goal 4 - Progress (Week 2): Not met PT Short Term Goal 5 (Week 2): Patient to perform w/c mobility 58' with S. PT Short Term Goal 5 - Progress (Week 2): Met Week 3:  PT Short Term Goal 1 (Week 3): Patient to perform squat pivot  transfers consistently with +1 A. PT Short Term Goal 2 (Week 3): Patient to perform sit to stand from elevated mat with +2 mod A PT Short Term Goal 3 (Week 3): Patient to propel w/c x 100' with S. PT Short Term Goal 4 (Week 3): Patient able to participate in w/c set up for transfer with min cues. PT Short Term Goal 5 (Week 3): Patient to tolerate standing in device x 75 seconds to allow for standing ADL's.  Skilled Therapeutic Interventions/Progress Updates:  Patient in supine reports needing to toilet.  Had pants partway down already.  Rolled with min A with rail and side to sit min A.  Patient performed scoot-pivot without board to drop arm BSC with +2 A for safety.  Patient toileted on BSC.  Attempted sit to stand to [place], but pt unable to stand with +2 A c/o numbness in hands and feet and seat too low to provide advantage to stand.  Patient performed scoot pivot onto bed with mod A +2 for safety.  To side lying with A for LE's mod A to perform hygiene and place clean brief.  In supine to pull up pants with mod A.  Patient rolled to R with rail and min A, side to sit min A with increased time.  Patient bed to w/c with mod A Via slide board.  Propelled in w/c x 90' with cues and increased  time for turns, etc.  Patient in gym sit to stand with [name] with +2 A for initiating up from low w/c.  Transferred to mat with device.  Seated on mat performed anterior weight shift with hands on w/c in front for attempted sit to squat with +1 mod A x about 8 reps.  Patient sit to stand to [place] able to stand 30 sec prior to needing to sit due to c/o UE fatigue.  Attempted again and used for transfer to w/c.  Patient assisted to room in w/c.  To drop arm recliner with slide board and +2 A for safety.  Left in recliner with seat cushion and spouse in the room and call bell in reach.   Ambulation/gait training;Community reintegration;DME/adaptive equipment instruction;Neuromuscular re-education;Psychosocial  support;Stair training;UE/LE Strength taining/ROM;Wheelchair propulsion/positioning;Balance/vestibular training;Discharge planning;Pain management;Therapeutic Activities;UE/LE Coordination activities;Cognitive remediation/compensation;Functional mobility training;Patient/family education;Therapeutic Exercise;Visual/perceptual remediation/compensation   Therapy Documentation Precautions:  Precautions Precautions: Fall,Back Restrictions Weight Bearing Restrictions: (P) No Other Position/Activity Restrictions: spinal Pain: Pain Assessment Pain Scale: 0-10 Pain Score: 10-Worst pain ever Faces Pain Scale: Hurts little more Pain Type: Acute pain Pain Location: Neck Pain Descriptors / Indicators: Spasm Pain Onset: On-going Pain Intervention(s): Distraction;Ambulation/increased activity;Relaxation  Therapy/Group: Individual Therapy  Reginia Naas  [place], PT 07/14/2020, 11:12 AM

## 2020-07-14 NOTE — Progress Notes (Signed)
PROGRESS NOTE   Subjective/Complaints: Patient seen laying in bed this morning.  Significant other at bedside.  She states she slept fairly well overnight.  She has questions regarding her sister visitation, antibiotics, recovery process.  ROS: Denies CP, SOB, N/V/D  Objective:   No results found. Recent Labs    07/14/20 0625  WBC 6.3  HGB 8.0*  HCT 27.1*  PLT 357   Recent Labs    07/14/20 0625  NA 138  K 3.3*  CL 104  CO2 25  GLUCOSE 80  BUN <5*  CREATININE 0.55  CALCIUM 8.6*    Intake/Output Summary (Last 24 hours) at 07/14/2020 1023 Last data filed at 07/14/2020 9563 Gross per 24 hour  Intake 600 ml  Output -  Net 600 ml        Physical Exam: Vital Signs Blood pressure 129/78, pulse 85, temperature 98.4 F (36.9 C), resp. rate 16, height 6' (1.829 m), weight 99.7 kg, SpO2 100 %. Constitutional: No distress . Vital signs reviewed. HENT: Normocephalic.  Atraumatic. Eyes: EOMI. No discharge. Cardiovascular: No JVD.  RRR. Respiratory: Normal effort.  No stridor.  Bilateral clear to auscultation. GI: Non-distended.  BS +. Skin: Warm and dry.  Intact. Psych: Normal mood.  Normal behavior. Musc: No edema in extremities.  No tenderness in extremities. Neurologic: Alert Motor: Bilateral upper extremities: 4/5 proximal distal, grip 4-/5  Bilateral lower extremities: Hip flexion 4/5, distally 4+/5 Bilateral upper extremity ataxia, unchanged  Assessment/Plan: 1. Functional deficits which require 3+ hours per day of interdisciplinary therapy in a comprehensive inpatient rehab setting.  Physiatrist is providing close team supervision and 24 hour management of active medical problems listed below.  Physiatrist and rehab team continue to assess barriers to discharge/monitor patient progress toward functional and medical goals  Care Tool:  Bathing  Bathing activity did not occur: Refused Body parts bathed  by patient: Right arm,Left arm,Chest,Abdomen,Front perineal area,Right upper leg,Left upper leg,Face   Body parts bathed by helper: Buttocks,Right lower leg,Left lower leg     Bathing assist Assist Level: Moderate Assistance - Patient 50 - 74%     Upper Body Dressing/Undressing Upper body dressing Upper body dressing/undressing activity did not occur (including orthotics): Refused What is the patient wearing?: Pull over shirt    Upper body assist Assist Level: Moderate Assistance - Patient 50 - 74%    Lower Body Dressing/Undressing Lower body dressing    Lower body dressing activity did not occur: Refused What is the patient wearing?: Incontinence brief,Pants     Lower body assist Assist for lower body dressing: Maximal Assistance - Patient 25 - 49%     Toileting Toileting Toileting Activity did not occur (Clothing management and hygiene only): Refused  Toileting assist Assist for toileting: Total Assistance - Patient < 25%     Transfers Chair/bed transfer  Transfers assist     Chair/bed transfer assist level: Moderate Assistance - Patient 50 - 74%     Locomotion Ambulation   Ambulation assist      Assist level: 2 helpers Assistive device: Ethelene Hal Max distance: 100ft   Walk 10 feet activity   Assist  Walk 10 feet activity did not occur: Safety/medical  concerns        Walk 50 feet activity   Assist Walk 50 feet with 2 turns activity did not occur: Safety/medical concerns         Walk 150 feet activity   Assist Walk 150 feet activity did not occur: Safety/medical concerns         Walk 10 feet on uneven surface  activity   Assist Walk 10 feet on uneven surfaces activity did not occur: Safety/medical concerns         Wheelchair     Assist Will patient use wheelchair at discharge?: Yes Type of Wheelchair: Manual Wheelchair activity did not occur: Safety/medical concerns  Wheelchair assist level: Contact Guard/Touching  assist Max wheelchair distance: 58'    Wheelchair 50 feet with 2 turns activity    Assist    Wheelchair 50 feet with 2 turns activity did not occur: Safety/medical concerns   Assist Level: Contact Guard/Touching assist   Wheelchair 150 feet activity     Assist  Wheelchair 150 feet activity did not occur: Safety/medical concerns       Medical Problem List and Plan: 1.  Decreased functional ability secondary to acute encephalopathy.  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    Continue [name] 2.  Antithrombotics: -DVT/anticoagulation: Continue Lovenox 52.5 mg daily.              -antiplatelet therapy: N/A 3. Pain Management: Oxycodone as needed  Robaxin 500 3 times daily scheduled on 3/12, continue  Controlled with meds on 3/21 4. Mood: Team support             -antipsychotic agents: N/A 5. Neuropsych: This patient is not capable of making decisions on her own behalf. 6. Skin/Wound Care: Routine skin checks 7. Fluids/Electrolytes/Nutrition: Routine in and outs 8.  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  Continue IV Rocephin through 07/23/2020 and follow-up infectious disease. 9.  Diabetes mellitus with hyperglycemia/DKA.  Hemoglobin A1c 8.0.  Check blood sugars before meals and at bedtime  CBG (last 3)  Recent Labs    07/13/20 1716 07/13/20 2127 07/14/20 0615  GLUCAP 108* 89 89   Lantus insulin 45 units daily, decreased to 40 on 3/16, decreased to 35 on 3/22  Decreased Novolog to 3U TID  Increased Metformin to 500mg  BID.   Relatively controlled on 3/21 10.  CLL.  Follow-up outpatient Dr. Delight Hoh 11.  Hypothyroidism.  Synthroid 12.  Anemia of chronic disease.  Multifactorial.   Hemoglobin 8.0 on 3/21  Continue to  monitor 13.  Hypertension.  Dilacor120 mg daily,Micardis 80-25 mg daily           Vitals:   07/13/20 2003 07/14/20 0523  BP: 104/74 129/78  Pulse: 94 85  Resp: 18 16  Temp: 98.9 F (37.2 C) 98.4 F (36.9 C)  SpO2: 97% 100%   Controlled on 3/21 14.  Transaminitis.    ALT elevated, trending down 3/14. 15.  Obesity.  BMI 31.19.  Dietary follow-up 16.  History of asthma.  Continue Singulair 10 mg nightly.   17.  History of genital herpes.  Conitnue Valtrex 18.  Sleep disturbance  Melatonin  ECG reviewed, trazodone 25 nightly started on 3/12  Overall improved 19.  Slow transit constipation  Bowel meds increased on 3/9, increased again on 3/11, decreased on 3/21 to optimize bowel  function 20.  Hypokalemia  Potassium 3.3 on 3/21  Supplemented x2 days on 3/21 21.  Urinary retention  Flomax increased on 3/11  Continue I/O caths as necessary  Emptying now without significant residual 22. Tachycardia:   -cardizem  Controlled on 3/21  LOS: 17 days A FACE TO FACE EVALUATION WAS PERFORMED  Ankit Lorie Phenix 07/14/2020, 10:23 AM

## 2020-07-14 NOTE — Progress Notes (Signed)
Occupational Therapy Weekly Progress Note  Patient Details  Name: Tonya Myers MRN: [id number] Date of Birth: 1966-03-21  Beginning of progress report period: July 07, 2020 End of progress report period: July 14, 2020     Patient has met 2 of 4 short term goals.  Patient with ongoing progress in all areas continues to struggle with leg weakness and difficulty moving to squat or stand position limiting her ability to perform toileting and functional transfers/bed mobility.  Upper body strength and coordination improving, adl performance improving  Patient continues to demonstrate the following deficits: muscle weakness, decreased cardiorespiratoy endurance, unbalanced muscle activation, decreased coordination and decreased motor planning, decreased problem solving and decreased standing balance and decreased postural control and therefore will continue to benefit from skilled OT intervention to enhance overall performance with BADL, iADL and Reduce care partner burden.  Patient progressing toward long term goals..  Continue plan of care.  OT Short Term Goals Week 2:  OT Short Term Goal 1 (Week 2): patient will complete sit pivot or SB transfers with mod A of one OT Short Term Goal 1 - Progress (Week 2): Met OT Short Term Goal 2 (Week 2): patient will complete side lying to/from sitting with min/mod A of one OT Short Term Goal 2 - Progress (Week 2): Progressing toward goal OT Short Term Goal 3 (Week 2): patient will complete UB bathing/dressing with set up OT Short Term Goal 3 - Progress (Week 2): Met OT Short Term Goal 4 (Week 2): patient will complete LB bathing and dressing with mod A using AD's OT Short Term Goal 4 - Progress (Week 2): Progressing toward goal Week 3:  OT Short Term Goal 1 (Week 3): patient will complete sit pivot transfers min A of one OT Short Term Goal 2 (Week 3): patient will complete side lying to/from sitting with min A of one OT Short Term Goal 3 (Week 3):  patient will complete toileting max A of one OT Short Term Goal 4 (Week 3): patient will complete LB bathing and dressing mod A with ADs   Carlos Levering 07/14/2020, 3:50 PM

## 2020-07-14 NOTE — Progress Notes (Signed)
Pt/husband educated on diabetes and insulin use, diet, and s/s of hypo/hyperglycemia. Pt/family in agreement and able to answer questions appropriately. Handout left with husband. Sheela Stack, LPN

## 2020-07-14 NOTE — Progress Notes (Signed)
Occupational Therapy Session Note  Patient Details  Name: Tonya Myers MRN: [id number] Date of Birth: 07/19/1965  Today's Date: 07/14/2020 OT Individual Time: 1410-1525 OT Individual Time Calculation (min): 75 min    Short Term Goals: Week 2:  OT Short Term Goal 1 (Week 2): patient will complete sit pivot or SB transfers with mod A of one OT Short Term Goal 2 (Week 2): patient will complete side lying to/from sitting with min/mod A of one OT Short Term Goal 3 (Week 2): patient will complete UB bathing/dressing with set up OT Short Term Goal 4 (Week 2): patient will complete LB bathing and dressing with mod A using AD's  Skilled Therapeutic Interventions/Progress Updates:    Patient in bed, husband present for session.  She notes numbness and tingling in bilateral hands and feet and questions improvement.  Reviewed progress to date and improvement specifically in bilateral hands with ability to manipulate objects and complete functional tasks.  She is able to roll in bed with CS.  Side lying to sitting edge of bed with mod A.  Sit pivot transfer bed to drop arm commode with mod A of one and stand by of 2nd.  Max A to manage clothing - continent of bowel.   Max A / dependent for hygiene and to donn clean brief at bed level.  SB transfer commode to bed mod A and cues for technique.  She is able to donn pants over bilateral feet in long sit position with min A, clothing management via rolling in bed with max A.  Returned to sitting edge of bed, sit pivot to w/c mod A.  Hand hygiene w/c level with set up.  Completed reassessment of Box and Blocks:  R = 32 (prior 23), L = 35 (prior 18).  Completed hand dexterity activities with moderate difficulty.  Returned to bed mod A of one with stand by of 2nd.  Sit to supine mod A of 2.  Reviewed goals for therapy and focus for upcoming week.   She remained in bed at close of session, bed alarm set and call bell in hand.    Therapy Documentation Precautions:   Precautions Precautions: Fall,Back Restrictions Weight Bearing Restrictions: (P) No Other Position/Activity Restrictions: spinal   Therapy/Group: Individual Therapy  Carlos Levering 07/14/2020, 7:38 AM

## 2020-07-14 NOTE — Progress Notes (Signed)
Speech Language Pathology Daily Session Note  Patient Details  Name: Tonya Myers MRN: [id number] Date of Birth: [birth year]  Today's Date: 07/14/2020 SLP Individual Time: 8756-4332 SLP Individual Time Calculation (min): 43 min  Short Term Goals: Week 2: SLP Short Term Goal 1 (Week 2): Patient will demonstrate alternating attention during mild complex level tasks with minA cues. SLP Short Term Goal 2 (Week 2): Patient will demonstrate awareness to errors in task performance with minA cues. SLP Short Term Goal 3 (Week 2): Patient will complete mildly complex information organization task with minA cues. SLP Short Term Goal 4 (Week 2): Patient will demonstrate recall of therapeutic and medical interventions with minA cues.  Skilled Therapeutic Interventions:Skilled ST services focused on cognitive skills. Pt continues to demonstrate great improvements in cognitive skills. SLP facilitated mildly complex and complex problem solving skills in garden organization task ans scheduling task. Pt required min A quickly fading to supervision A verbal cues for error awareness, further impacted by deficits in working memory. SLP continues to record information as pt dictates due to UE weakness. Pt required supervision A verbal cues for alternating attention between conversation and tasks. SLP recommends reducing times per week to x3 a week if progress continues. Pt was left in room with call bell within reach and bed alarm set. SLP recommends to continue skilled services.     Pain Pain Assessment Pain Scale: 0-10 Pain Score: 8  Faces Pain Scale: Hurts little more Pain Type: Acute pain Pain Location: Neck Pain Descriptors / Indicators: Spasm Pain Onset: On-going Pain Intervention(s): Medication (See eMAR)  Therapy/Group: Individual Therapy  [place]  [facility] 07/14/2020, 12:57 PM

## 2020-07-15 DIAGNOSIS — I10 Essential (primary) hypertension: Secondary | ICD-10-CM | POA: Diagnosis not present

## 2020-07-15 DIAGNOSIS — G9340 Encephalopathy, unspecified: Secondary | ICD-10-CM | POA: Diagnosis not present

## 2020-07-15 DIAGNOSIS — D638 Anemia in other chronic diseases classified elsewhere: Secondary | ICD-10-CM | POA: Diagnosis not present

## 2020-07-15 DIAGNOSIS — G061 Intraspinal abscess and granuloma: Secondary | ICD-10-CM | POA: Diagnosis not present

## 2020-07-15 LAB — GLUCOSE, CAPILLARY
Glucose-Capillary: 113 mg/dL — ABNORMAL HIGH (ref 70–99)
Glucose-Capillary: 111 mg/dL — ABNORMAL HIGH (ref 70–99)
Glucose-Capillary: 103 mg/dL — ABNORMAL HIGH (ref 70–99)
Glucose-Capillary: 88 mg/dL (ref 70–99)

## 2020-07-15 NOTE — Plan of Care (Signed)
  Problem: RH Expression Communication Goal: LTG Patient will increase word finding of common (SLP) Description: LTG:  Patient will increase word finding of common objects/daily info/abstract thoughts with cues using compensatory strategies (SLP). Outcome: Completed/Met Flowsheets (Taken 06/30/2020 1245) LTG: Patient will increase word finding of common (SLP): Modified Independent   Problem: RH Problem Solving Goal: LTG Patient will demonstrate problem solving for (SLP) Description: LTG:  Patient will demonstrate problem solving for basic/complex daily situations with cues  (SLP) Flowsheets (Taken 07/15/2020 1230) LTG Patient will demonstrate problem solving for: Supervision Note: Upgraded due to progress   Problem: RH Memory Goal: LTG Patient will use memory compensatory aids to (SLP) Description: LTG:  Patient will use memory compensatory aids to recall biographical/new, daily complex information with cues (SLP) Flowsheets (Taken 07/15/2020 1230) LTG: Patient will use memory compensatory aids to (SLP): Supervision Note: Upgraded due to progress   Problem: RH Attention Goal: LTG Patient will demonstrate this level of attention during functional activites (SLP) Description: LTG:  Patient will will demonstrate this level of attention during functional activites (SLP) Flowsheets (Taken 07/15/2020 1230) Patient will demonstrate during cognitive/linguistic activities the attention type of: Alternating LTG: Patient will demonstrate this level of attention during cognitive/linguistic activities with assistance of (SLP): Supervision Note: Upgraded due to progress    Problem: RH Awareness Goal: LTG: Patient will demonstrate awareness during functional activites type of (SLP) Description: LTG: Patient will demonstrate awareness during functional activites type of (SLP) Flowsheets Taken 07/15/2020 1230 LTG: Patient will demonstrate awareness during cognitive/linguistic activities with assistance of  (SLP): Supervision Taken 06/30/2020 1245 Patient will demonstrate during cognitive/linguistic activities awareness type of: Anticipatory Note: Upgraded due to progress

## 2020-07-15 NOTE — Progress Notes (Signed)
Speech Language Pathology Daily Session Note  Patient Details  Name: Tonya Myers MRN: [id number] Date of Birth: 1966-02-11  Today's Date: 07/15/2020 SLP Individual Time: 1445-1530 SLP Individual Time Calculation (min): 45 min  Short Term Goals: Week 2: SLP Short Term Goal 1 (Week 2): Patient will demonstrate alternating attention during mild complex level tasks with minA cues. SLP Short Term Goal 2 (Week 2): Patient will demonstrate awareness to errors in task performance with minA cues. SLP Short Term Goal 3 (Week 2): Patient will complete mildly complex information organization task with minA cues. SLP Short Term Goal 4 (Week 2): Patient will demonstrate recall of therapeutic and medical interventions with minA cues.  Skilled Therapeutic Interventions:Skilled ST services focused on cognitive skills. SLP facilitated complex problem solving in familiar task of scheduling required min A verbal cues for error awareness that pt was able to immediate fix. Pt demonstrated complex problem solving in novel simple deductive reasoning tasks pt required mod-min A verbal cues, further impacted by reduced attention due to anxiety with door less open than desired. SLP upgraded goals due to continued progress. Pt was left in room with call bell within reach and bed alarm set. SLP recommends to continue skilled services.     Pain Pain Assessment Pain Scale: 0-10 Pain Score: 7  Pain Location: Neck Pain Orientation: Left;Right Pain Intervention(s): Medication (See eMAR);Pain med given for lower pain score than stated, per patient request  Therapy/Group: Individual Therapy  [place]  [facility] 07/15/2020, 11:53 AM

## 2020-07-15 NOTE — Progress Notes (Signed)
Physical Therapy Session Note  Patient Details  Name: Tonya Myers MRN: [id number] Date of Birth: 08-30-1965  Today's Date: 07/15/2020 PT Individual Time: 1015-1115 PT Individual Time Calculation (min): 60 min   Short Term Goals: Week 3:  PT Short Term Goal 1 (Week 3): Patient to perform squat pivot transfers consistently with +1 A. PT Short Term Goal 2 (Week 3): Patient to perform sit to stand from elevated mat with +2 mod A PT Short Term Goal 3 (Week 3): Patient to propel w/c x 100' with S. PT Short Term Goal 4 (Week 3): Patient able to participate in w/c set up for transfer with min cues. PT Short Term Goal 5 (Week 3): Patient to tolerate standing in device x 75 seconds to allow for standing ADL's.  Skilled Therapeutic Interventions/Progress Updates:    Patient in w/c and requesting a larger w/c.  Propelled w/c about 18' with S and reports UE's tired after using arm bike earlier today.  Patient transferred to mat with slide board and mod A.  Sit to side with mod A for LE's.  Performed sidelying clamshell hip abduction x 15 with A for positioning on side.  Rolled to supine with min A.  Performed bridging with ball between legs x 2 x 10 w/ 3 sec hold, lateral trunk rotation, hooklying marches x 10 with A and rolled to L for clamshell hip abduction on other leg x 15.  While pt in supine changed out w/c for larger w/c and discussed planning a w/c consult for custom chair for home. Patient supine to sit with min A from sidelying.  Performed lateral lean to place board with CGA and slide board to 20" w/c with min A.  Propelled 10' with S in w/c.  Assisted to room in w/c and performed slide board transfer to bed per pt preference and for IV infusion.  Patient min to mod A back to bed and sit to supine with mod A for LE's.  Positioned for comfort and left with call bell in reach and bed alarm on.   Therapy Documentation Precautions:  Precautions Precautions: Fall,Back Restrictions Weight Bearing  Restrictions: (P) No Other Position/Activity Restrictions: spinal Pain: Pain Assessment Pain Scale: 0-10 Pain Score: 10-Worst pain ever Faces Pain Scale: Hurts little more Pain Location: Neck Pain Orientation: Left;Right Pain Descriptors / Indicators: Aching Pain Onset: On-going Pain Intervention(s): Distraction   Therapy/Group: Individual Therapy  Reginia Naas  Magda Kiel, PT 07/15/2020, 12:51 PM

## 2020-07-15 NOTE — Progress Notes (Signed)
PROGRESS NOTE   Subjective/Complaints: Patient seen laying in bed this AM.  She states she slept well overnight.  She has questions again, regarding recovery again.  ROS: Denies CP, SOB, N/V/D  Objective:   No results found. Recent Labs    07/14/20 0625  WBC 6.3  HGB 8.0*  HCT 27.1*  PLT 357   Recent Labs    07/14/20 0625  NA 138  K 3.3*  CL 104  CO2 25  GLUCOSE 80  BUN <5*  CREATININE 0.55  CALCIUM 8.6*    Intake/Output Summary (Last 24 hours) at 07/15/2020 1038 Last data filed at 07/15/2020 7106 Gross per 24 hour  Intake 600 ml  Output -  Net 600 ml        Physical Exam: Vital Signs Blood pressure 136/69, pulse 86, temperature 98.9 F (37.2 C), resp. rate 17, height 6' (1.829 m), weight 99.7 kg, SpO2 100 %. Constitutional: No distress . Vital signs reviewed. HENT: Normocephalic.  Atraumatic. Eyes: EOMI. No discharge. Cardiovascular: No JVD.  RRR. Respiratory: Normal effort.  No stridor.  Bilateral clear to auscultation. GI: Non-distended.  BS +. Skin: Warm and dry.  Intact. Psych: Normal mood.  Normal behavior. Musc: No edema in extremities.  No tenderness in extremities. Neurologic: Alert Motor: Bilateral upper extremities: 4/5 proximal distal, grip 4-/5, unchanged Bilateral lower extremities: Hip flexion 4/5, distally 4+/5 Bilateral upper extremity ataxia, unchanged  Assessment/Plan: 1. Functional deficits which require 3+ hours per day of interdisciplinary therapy in a comprehensive inpatient rehab setting.  Physiatrist is providing close team supervision and 24 hour management of active medical problems listed below.  Physiatrist and rehab team continue to assess barriers to discharge/monitor patient progress toward functional and medical goals  Care Tool:  Bathing  Bathing activity did not occur: Refused Body parts bathed by patient: Right arm,Left arm,Chest,Abdomen,Front perineal  area,Right upper leg,Left upper leg,Face   Body parts bathed by helper: Buttocks,Right lower leg,Left lower leg     Bathing assist Assist Level: Moderate Assistance - Patient 50 - 74%     Upper Body Dressing/Undressing Upper body dressing Upper body dressing/undressing activity did not occur (including orthotics): Refused What is the patient wearing?: Pull over shirt    Upper body assist Assist Level: Moderate Assistance - Patient 50 - 74%    Lower Body Dressing/Undressing Lower body dressing    Lower body dressing activity did not occur: Refused What is the patient wearing?: Incontinence brief,Pants     Lower body assist Assist for lower body dressing: Maximal Assistance - Patient 25 - 49%     Toileting Toileting Toileting Activity did not occur (Clothing management and hygiene only): Refused  Toileting assist Assist for toileting: Total Assistance - Patient < 25%     Transfers Chair/bed transfer  Transfers assist     Chair/bed transfer assist level: Dependent - mechanical lift     Locomotion Ambulation   Ambulation assist      Assist level: 2 helpers Assistive device: Ethelene Hal Max distance: 5ft   Walk 10 feet activity   Assist  Walk 10 feet activity did not occur: Safety/medical concerns        Walk 50 feet activity  Assist Walk 50 feet with 2 turns activity did not occur: Safety/medical concerns         Walk 150 feet activity   Assist Walk 150 feet activity did not occur: Safety/medical concerns         Walk 10 feet on uneven surface  activity   Assist Walk 10 feet on uneven surfaces activity did not occur: Safety/medical concerns         Wheelchair     Assist Will patient use wheelchair at discharge?: Yes Type of Wheelchair: Manual Wheelchair activity did not occur: Safety/medical concerns  Wheelchair assist level: Supervision/Verbal cueing Max wheelchair distance: 54'    Wheelchair 50 feet with 2 turns  activity    Assist    Wheelchair 50 feet with 2 turns activity did not occur: Safety/medical concerns   Assist Level: Supervision/Verbal cueing   Wheelchair 150 feet activity     Assist  Wheelchair 150 feet activity did not occur: Safety/medical concerns       Medical Problem List and Plan: 1.  Decreased functional ability secondary to acute encephalopathy.  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    Continue [name] 2.  Antithrombotics: -DVT/anticoagulation: Continue Lovenox 52.5 mg daily.              -antiplatelet therapy: N/A 3. Pain Management: Oxycodone as needed  Robaxin 500 3 times daily scheduled on 3/12, continue  Controlled with meds on 3/22 4. Mood: Team support             -antipsychotic agents: N/A 5. Neuropsych: This patient is not capable of making decisions on her own behalf.  Required positive reinforcement 6. Skin/Wound Care: Routine skin checks 7. Fluids/Electrolytes/Nutrition: Routine in and outs 8.  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  Continue IV Rocephin through 07/23/2020 and follow-up infectious disease. 9.  Diabetes mellitus with hyperglycemia/DKA.  Hemoglobin A1c 8.0.  Check blood sugars before meals and at bedtime  CBG (last 3)  Recent Labs    07/14/20 1718 07/14/20 2118 07/15/20 0629  GLUCAP 97 125* 113*   Lantus insulin 45 units daily, decreased to 40 on 3/16, decreased to 35 on 3/22  Decreased Novolog to 3U TID  Increased Metformin to 500mg  BID.   Mildly elevated on 3/22 10.  CLL.  Follow-up outpatient Dr. Delight Hoh 11.  Hypothyroidism.  Synthroid 12.  Anemia of chronic disease.  Multifactorial.   Hemoglobin 8.0 on 3/21  Continue to monitor 13.  Hypertension.  Dilacor120 mg  daily,Micardis 80-25 mg daily           Vitals:   07/14/20 2012 07/15/20 0501  BP: 128/76 136/69  Pulse: 89 86  Resp: 18 17  Temp: 98.6 F (37 C) 98.9 F (37.2 C)  SpO2: 100% 100%   Controlled on 3/22 14.  Transaminitis.    ALT elevated, trending down 3/14. 15.  Obesity.  BMI 31.19.  Dietary follow-up 16.  History of asthma.  Continue Singulair 10 mg nightly.   17.  History of genital herpes.  Conitnue Valtrex 18.  Sleep disturbance  Melatonin  ECG reviewed, trazodone 25 nightly started on 3/12  Improving 19.  Slow transit constipation  Bowel meds increased on 3/9, increased again on 3/11, decreased on 3/21 to optimize bowel function  Improving 20.  Hypokalemia  Potassium 3.3 on 3/21  Supplemented  x2 days on 3/21 21.  Urinary retention  Flomax increased on 3/11  Continue I/O caths as necessary  Emptying now without significant residual 22. Tachycardia:   -cardizem  Controlled on 3/22  LOS: 18 days A FACE TO FACE EVALUATION WAS PERFORMED  Ankit Lorie Phenix 07/15/2020, 10:38 AM

## 2020-07-15 NOTE — Progress Notes (Signed)
Occupational Therapy Session Note  Patient Details  Name: Tonya Myers MRN: [id number] Date of Birth: Mar 11, 1966  Today's Date: 07/15/2020 OT Individual Time: 0900-1000   &   1400-1430 OT Individual Time Calculation (min): 60 min    &   30 min   Short Term Goals: Week 3:  OT Short Term Goal 1 (Week 3): patient will complete sit pivot transfers min A of one OT Short Term Goal 2 (Week 3): patient will complete side lying to/from sitting with min A of one OT Short Term Goal 3 (Week 3): patient will complete toileting max A of one OT Short Term Goal 4 (Week 3): patient will complete LB bathing and dressing mod A with ADs  Skilled Therapeutic Interventions/Progress Updates:    AM session:   Patient in bed, alert and ready for therapy session.  Rolling in bed with CS, side lying to sitting edge of bed with min A of one.  Sit pivot transfer bed to/from drop arm commode mod A of one with stand by of 2nd.  Using right grab bar able to squat for removal of brief and hygiene tasks this session with max A and max cues.  Sit pivot back to bed with mod A of one, min A of 2nd.  Completed LB dressing at bed level - able to thread both legs into pants, requires max A for CM via rolling side to side.   donns sneakers seated edge of bed with max A.  Sit pivot to w/c mod A of one, stand by of 2nd.  Completed UB bathing/dressing and grooming tasks w/c level with min a.  Completed upper body ergometer 2 x 3 minutes .  She remained seated in w/c at close of session, seat belt alarm set and call bell in hand.     PM session:   Patient in bed, ready for therapy session.  She is able to donn slipper socks at bed level with min A to start left.  Side lying to sitting edge of bed with min A.  She tolerated 15 minutes of trunk control/posture and scapular activities with encouragement.  Returned to supine position with min/mod A of one.  Completed shoulder and elbow conditioning activities without assistance - fatigues  quickly.  She remained in bed at close of session, bed alarm set and call bell in reach.     Therapy Documentation Precautions:  Precautions Precautions: Fall,Back Restrictions Weight Bearing Restrictions: (P) No Other Position/Activity Restrictions: spinal   Therapy/Group: Individual Therapy  Carlos Levering 07/15/2020, 7:36 AM

## 2020-07-16 DIAGNOSIS — E876 Hypokalemia: Secondary | ICD-10-CM

## 2020-07-16 DIAGNOSIS — G9340 Encephalopathy, unspecified: Secondary | ICD-10-CM | POA: Diagnosis not present

## 2020-07-16 DIAGNOSIS — G479 Sleep disorder, unspecified: Secondary | ICD-10-CM | POA: Diagnosis not present

## 2020-07-16 DIAGNOSIS — E1100 Type 2 diabetes mellitus with hyperosmolarity without nonketotic hyperglycemic-hyperosmolar coma (NKHHC): Secondary | ICD-10-CM | POA: Diagnosis not present

## 2020-07-16 DIAGNOSIS — G061 Intraspinal abscess and granuloma: Secondary | ICD-10-CM | POA: Diagnosis not present

## 2020-07-16 LAB — GLUCOSE, CAPILLARY
Glucose-Capillary: 76 mg/dL (ref 70–99)
Glucose-Capillary: 110 mg/dL — ABNORMAL HIGH (ref 70–99)
Glucose-Capillary: 147 mg/dL — ABNORMAL HIGH (ref 70–99)
Glucose-Capillary: 97 mg/dL (ref 70–99)

## 2020-07-16 NOTE — Plan of Care (Signed)
PT long term goals updated as noted below.  Problem: RH Balance Goal: LTG Patient will maintain dynamic standing balance (PT) Description: LTG:  Patient will maintain dynamic standing balance with assistance during mobility activities (PT) Flowsheets (Taken 07/16/2020 1734) LTG: Pt will maintain dynamic standing balance during mobility activities with:: (Goal downgraded due to lack of progression) Maximal Assistance - Patient 25 - 49%   Problem: Sit to Stand Goal: LTG:  Patient will perform sit to stand with assistance level (PT) Description: LTG:  Patient will perform sit to stand with assistance level (PT) Flowsheets (Taken 07/16/2020 1734) LTG: PT will perform sit to stand in preparation for functional mobility with assistance level: (goal downgraded due to lack of progress) Maximal Assistance - Patient 25 - 49%   Problem: RH Bed Mobility Goal: LTG Patient will perform bed mobility with assist (PT) Description: LTG: Patient will perform bed mobility with assistance, with/without cues (PT). Flowsheets (Taken 07/16/2020 1734) LTG: Pt will perform bed mobility with assistance level of: (goal downgraded due to slow progress) Contact Guard/Touching assist   Problem: RH Car Transfers Goal: LTG Patient will perform car transfers with assist (PT) Description: LTG: Patient will perform car transfers with assistance (PT). Flowsheets (Taken 07/16/2020 1734) LTG: Pt will perform car transfers with assist:: (goal downgraded due to slow progress) Moderate Assistance - Patient 50 - 74%   Problem: RH Ambulation Goal: LTG Patient will ambulate in controlled environment (PT) Description: LTG: Patient will ambulate in a controlled environment, # of feet with assistance (PT). Outcome: Not Applicable Flowsheets (Taken 07/16/2020 1734) LTG: Pt will ambulate in controlled environ  assist needed:: (goal discontinued due to lack of progress) --   Problem: RH Ambulation Goal: LTG Patient will ambulate in home  environment (PT) Description: LTG: Patient will ambulate in home environment, # of feet with assistance (PT). Outcome: Not Applicable Flowsheets (Taken 07/16/2020 1734) LTG: Pt will ambulate in home environ  assist needed:: (goal discontinued due to lack of progress) --   Problem: RH Wheelchair Mobility Goal: LTG Patient will propel w/c in controlled environment (PT) Description: LTG: Patient will propel wheelchair in controlled environment, # of feet with assist (PT) Flowsheets (Taken 07/16/2020 1734) LTG: Pt will propel w/c in controlled environ  assist needed:: (goal upgraded due to progress) Supervision/Verbal cueing LTG: Propel w/c distance in controlled environment: 100'   Problem: RH Wheelchair Mobility Goal: LTG Patient will propel w/c in home environment (PT) Description: LTG: Patient will propel wheelchair in home environment, # of feet with assistance (PT). Flowsheets (Taken 07/16/2020 1734) LTG: Pt will propel w/c in home environ  assist needed:: (goal upgraded due to progress) Supervision/Verbal cueing   Problem: RH Stairs Goal: LTG Patient will ambulate up and down stairs w/assist (PT) Description: LTG: Patient will ambulate up and down # of stairs with assistance (PT) Outcome: Not Applicable Flowsheets (Taken 07/16/2020 1734) LTG: Pt will ambulate up/down stairs assist needed:: (goal discontinued due to lack of progress) --  Magda Kiel, PT

## 2020-07-16 NOTE — Progress Notes (Signed)
Physical Therapy Session Note  Patient Details  Name: Tonya Myers MRN: [id number] Date of Birth: [birth year]  Today's Date: 07/16/2020 PT Individual Time:Session1: 3662-9476; Session2: 1500-1600 PT Individual Time Calculation (min): 30 min & 60 min  Short Term Goals: Week 3:  PT Short Term Goal 1 (Week 3): Patient to perform squat pivot transfers consistently with +1 A. PT Short Term Goal 2 (Week 3): Patient to perform sit to stand from elevated mat with +2 mod A PT Short Term Goal 3 (Week 3): Patient to propel w/c x 100' with S. PT Short Term Goal 4 (Week 3): Patient able to participate in w/c set up for transfer with min cues. PT Short Term Goal 5 (Week 3): Patient to tolerate standing in device x 75 seconds to allow for standing ADL's.  Skilled Therapeutic Interventions/Progress Updates:    Session1:  Patient seen with sister in the room.  Patient requesting to attempt wide 3:1 to toilet, but not enough time so agreed to bedpan.  Pt assisted to doff pants with mod A, rolled with min A using rail and placed bed pan, after toileting pt rolled min A and total A for hygiene and donning brief, pt assisted to don pants with mod A.  Side to sit with min A for lifting trunk.  Patient attempting to place slide board completed placement with mod A.  Slide to w/c mod A and assisted with w/c parts.  Patient propelled w/c x 110' with S to CGA and one rest break.  Patient assisted back to room and performed slide board transfer back to bed with mod A.  Left seated EOB with NT in the room.   Session2:  Patient in supine with son in the room.  States he brought her some shoes, but they are too narrow.  Donned tennis shoes total A in supine.  Rolled to R with CGA, side to sit min A.  Patient performed slide board transfer with min to mod A.  Pushed in chair for energy conservation to therapy gym.  Slide board transfer to mat with min A.  Sit to stand x 2 with [name] from elevated mat (pt preference due to  knees buckling), and able to stand 50 sec then 60 sec with cues.  Patient requesting to toilet.  Transfer to w/c with slide board attempting for pt to place board and educated this is a way for her to also perform toilet hygiene.  Min A for transfer to w/c from higher mat.  Propelled x 30' with S then fatigued so pushed to room.  Patient initially wanting to try wide drop arm BSC, then noted it was higher so changed her mind and decided to use bed pan.  Patient rolled with min A and mod a to doff pants, placed pan mod A.  Performed hygiene dependent.  Assist to don clean brief with pt rolling with rails and CGA.  Patient scooting up in bed +2 A.   Left with call bell in reach, sister in the room and bed alarm active.   Therapy Documentation Precautions:  Precautions Precautions: Fall,Back Restrictions Weight Bearing Restrictions: No Other Position/Activity Restrictions: spinal Pain: Pain Assessment Pain Scale: 0-10 Pain Score: 10-Worst pain ever Faces Pain Scale: Hurts little more Pain Type: Acute pain Pain Location: Back Pain Descriptors / Indicators: Sore Pain Onset: With Activity Pain Intervention(s): Rest;Distraction   Therapy/Group: Individual Therapy  Reginia Naas  [place], PT 07/16/2020, 12:05 PM

## 2020-07-16 NOTE — Progress Notes (Addendum)
PROGRESS NOTE   Subjective/Complaints: Patient seen laying in bed this AM.  She states states she slept fairly overnight due staff coming in/out of room.  She has questions regarding home set up for discharge.  Sister is at bedside.    ROS: Denies CP, SOB, N/V/D  Objective:   No results found. Recent Labs    07/14/20 0625  WBC 6.3  HGB 8.0*  HCT 27.1*  PLT 357   Recent Labs    07/14/20 0625  NA 138  K 3.3*  CL 104  CO2 25  GLUCOSE 80  BUN <5*  CREATININE 0.55  CALCIUM 8.6*    Intake/Output Summary (Last 24 hours) at 07/16/2020 1019 Last data filed at 07/16/2020 0818 Gross per 24 hour  Intake 600 ml  Output --  Net 600 ml        Physical Exam: Vital Signs Blood pressure 128/77, pulse 97, temperature 99.9 F (37.7 C), resp. rate 16, height 6' (1.829 m), weight 99.7 kg, SpO2 96 %. Constitutional: No distress . Vital signs reviewed. HENT: Normocephalic.  Atraumatic. Eyes: EOMI. No discharge. Cardiovascular: No JVD.  RRR. Respiratory: Normal effort.  No stridor.  Bilateral clear to auscultation. GI: Non-distended.  BS +. Skin: Warm and dry.  Intact. Psych: Normal mood.  Normal behavior. Musc: No edema in extremities.  No tenderness in extremities. Neurologic: Alert Motor: Bilateral upper extremities: 4/5 proximal distal, grip 4-/5, stable Bilateral lower extremities: Hip flexion 4/5, distally 4+/5, stable Bilateral upper extremity ataxia, unchanged  Assessment/Plan: 1. Functional deficits which require 3+ hours per day of interdisciplinary therapy in a comprehensive inpatient rehab setting.  Physiatrist is providing close team supervision and 24 hour management of active medical problems listed below.  Physiatrist and rehab team continue to assess barriers to discharge/monitor patient progress toward functional and medical goals  Care Tool:  Bathing  Bathing activity did not occur: Refused Body  parts bathed by patient: Right arm,Left arm,Chest,Abdomen,Front perineal area,Right upper leg,Left upper leg,Face   Body parts bathed by helper: Buttocks,Right lower leg,Left lower leg     Bathing assist Assist Level: Moderate Assistance - Patient 50 - 74%     Upper Body Dressing/Undressing Upper body dressing Upper body dressing/undressing activity did not occur (including orthotics): Refused What is the patient wearing?: Pull over shirt    Upper body assist Assist Level: Moderate Assistance - Patient 50 - 74%    Lower Body Dressing/Undressing Lower body dressing    Lower body dressing activity did not occur: Refused What is the patient wearing?: Pants,Incontinence brief     Lower body assist Assist for lower body dressing: Moderate Assistance - Patient 50 - 74%     Toileting Toileting Toileting Activity did not occur Landscape architect and hygiene only): Refused  Toileting assist Assist for toileting: 2 Helpers     Transfers Chair/bed transfer  Transfers assist     Chair/bed transfer assist level: Dependent - mechanical lift     Locomotion Ambulation   Ambulation assist      Assist level: 2 helpers Assistive device: Walker-Eva Max distance: 78ft   Walk 10 feet activity   Assist  Walk 10 feet activity did not occur:  Safety/medical concerns        Walk 50 feet activity   Assist Walk 50 feet with 2 turns activity did not occur: Safety/medical concerns         Walk 150 feet activity   Assist Walk 150 feet activity did not occur: Safety/medical concerns         Walk 10 feet on uneven surface  activity   Assist Walk 10 feet on uneven surfaces activity did not occur: Safety/medical concerns         Wheelchair     Assist Will patient use wheelchair at discharge?: Yes Type of Wheelchair: Manual Wheelchair activity did not occur: Safety/medical concerns  Wheelchair assist level: Supervision/Verbal cueing Max wheelchair  distance: 7'    Wheelchair 50 feet with 2 turns activity    Assist    Wheelchair 50 feet with 2 turns activity did not occur: Safety/medical concerns   Assist Level: Supervision/Verbal cueing   Wheelchair 150 feet activity     Assist  Wheelchair 150 feet activity did not occur: Safety/medical concerns       Medical Problem List and Plan: 1.  Decreased functional ability secondary to acute encephalopathy.  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    Continue [name]  Team conference today to discuss current and goals and coordination of care, home and environmental barriers, and discharge planning with nursing, case manager, and therapies. Please see conference note from today as well.  2.  Antithrombotics: -DVT/anticoagulation: Continue Lovenox 52.5 mg daily.              -antiplatelet therapy: N/A 3. Pain Management: Oxycodone as needed  Robaxin 500 3 times daily scheduled on 3/12, continue  Controlled with meds on 3/23 4. Mood: Team support             -antipsychotic agents: N/A 5. Neuropsych: This patient is not capable of making decisions on her own behalf.  Requires consistent positive reinforcement 6. Skin/Wound Care: Routine skin checks 7. Fluids/Electrolytes/Nutrition: Routine in and outs 8.  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  Continue IV Rocephin through 07/23/2020 and follow-up infectious disease. 9.  Diabetes mellitus with hyperglycemia/DKA.  Hemoglobin A1c 8.0.  Check blood sugars before meals and at bedtime  CBG (last 3)  Recent Labs    07/15/20 1721 07/15/20 2109 07/16/20 0608  GLUCAP 103* 88 76   Lantus insulin 45 units daily, decreased to 40 on 3/16, decreased to 35 on 3/22  Decreased Novolog to 3U  TID  Increased Metformin to 500mg  BID.   Controlled on 3/23 10.  CLL.  Follow-up outpatient Dr. Delight Hoh 11.  Hypothyroidism.  Synthroid 12.  Anemia of chronic disease.  Multifactorial.   Hemoglobin 8.0 on 3/21  Continue to monitor 13.  Hypertension.  Dilacor120 mg daily,Micardis 80-25 mg daily           Vitals:   07/15/20 2027 07/16/20 0349  BP: (!) 144/70 128/77  Pulse: 88 97  Resp: 16 16  Temp: 98.3 F (36.8 C) 99.9 F (37.7 C)  SpO2: 97% 96%   Relatively controlled on 3/22 14.  Transaminitis.    ALT elevated, trending down 3/14. 15.  Obesity.  BMI 31.19.  Dietary follow-up 16.  History of asthma.  Continue Singulair 10 mg nightly.   17.  History of genital herpes.  Conitnue  Valtrex 18.  Sleep disturbance  Melatonin  ECG reviewed, trazodone 25 nightly started on 3/12  Improving overall  19.  Slow transit constipation  Bowel meds increased on 3/9, increased again on 3/11, decreased on 3/21 to optimize bowel function  Improving 20.  Hypokalemia  Potassium 3.3 on 3/21, labs ordered for tomorrow  Supplemented x2 days on 3/21 21.  Urinary retention  Flomax increased on 3/11  Continue I/O caths as necessary  Emptying now without significant residual 22. Tachycardia:   -cardizem  Controlled on 3/23  LOS: 19 days A FACE TO FACE EVALUATION WAS PERFORMED  Robertlee Rogacki Lorie Phenix 07/16/2020, 10:19 AM

## 2020-07-16 NOTE — Progress Notes (Signed)
Occupational Therapy Session Note  Patient Details  Name: Tonya Myers MRN: [id number] Date of Birth: 1965/08/22  Today's Date: 07/16/2020 OT Individual Time: 0900-1000 OT Individual Time Calculation (min): 60 min    Short Term Goals: Week 3:  OT Short Term Goal 1 (Week 3): patient will complete sit pivot transfers min A of one OT Short Term Goal 2 (Week 3): patient will complete side lying to/from sitting with min A of one OT Short Term Goal 3 (Week 3): patient will complete toileting max A of one OT Short Term Goal 4 (Week 3): patient will complete LB bathing and dressing mod A with ADs  Skilled Therapeutic Interventions/Progress Updates:    Patient in bed, alert and ready for therapy.  She notes mild pain in back/neck at times during session - relieved with light massage and repositioning.  She is able to donn pants bed level to thighs with set up, mod A over hips via rolling and attempt to bridge.  Provided and reviewed strategies for hygiene with bariatric commode - will practice in follow up session.  Side lying to sitting min A.  Attempted weight shift and power up with stedy from elevated bed surface, unable to stand fully to utilize functionality of stedy.  Completed SB transfer to/from bed, w/c, mat table min A - cues for weight shift and positioning.  Completed oral care at sink with CS/set up.  Completed trunk control, posture, pelvic mobility, core strengthening, UB stretch and strengthening seated unsupported on mat table - note improved upright posture but fatigues quickly.  SB transfer back to bed min/mod A.  Sit to supine mod A.  She remained in bed at close of session, bed alarm set and call bell in hand.    Therapy Documentation Precautions:  Precautions Precautions: Fall,Back Restrictions Weight Bearing Restrictions: (P) No Other Position/Activity Restrictions: spinal   Therapy/Group: Individual Therapy  Carlos Levering 07/16/2020, 7:33 AM

## 2020-07-16 NOTE — Patient Care Conference (Signed)
Inpatient RehabilitationTeam Conference and Plan of Care Update Date: 07/16/2020   Time: 11:41 AM    Patient Name: Tonya Myers      Medical Record Number: [id number]  Date of Birth: 1965-07-28 Sex: Female         Room/Bed: 4W20C/4W20C-01 Payor Info: Payor: MEDICARE / Plan: MEDICARE PART A AND B / Product Type: *No Product type* /    Admit Date/Time:  06/27/2020 12:46 PM  Primary Diagnosis:  Acute encephalopathy  Hospital Problems: Principal Problem:   Acute encephalopathy Active Problems:   CLL (chronic lymphocytic leukemia) (HCC)   Acute hemorrhoid   DDD (degenerative disc disease), cervical   Cervical disc disorder with radiculopathy of cervical region   Cervical facet syndrome   DDD (degenerative disc disease), lumbar   Facet syndrome, lumbar   Bilateral occipital neuralgia   Sacroiliac joint dysfunction   DJD of shoulder   Hypothyroidism   Arthritis   Carpal tunnel syndrome, bilateral   Chronic pain in right shoulder   Controlled type 2 diabetes mellitus without complication, without long-term current use of insulin (HCC)   Essential hypertension   Herpes simplex vulvovaginitis   Pneumococcal meningitis   Bacterial spinal epidural abscess   Meningitis   Labile blood glucose   Uncontrolled type 2 diabetes mellitus with hyperosmolar nonketotic hyperglycemia (HCC)   Transaminitis   Sleep disturbance   Anemia of chronic disease   Slow transit constipation   Urinary retention   Hypokalemia    Expected Discharge Date: Expected Discharge Date: 07/24/20  Team Members Present: Physician leading conference: Dr. Delice Lesch Care Coodinator Present: Ovidio Kin, [name];Deborah Hervey Ard, RN, BSN, CRRN Nurse Present: [name], RN PT Present: Magda Kiel, PT OT Present: Elisabeth Most, OT SLP Present: Charolett Bumpers, SLP PPS Coordinator present : Ileana Ladd, PT     Current Status/Progress Goal Weekly Team Focus  Bowel/Bladder   Continent with episodes of  incontinence. LBM 07/15/2020  Pt to be fully continent.  Assess b/b Qshift and PRN   Swallow/Nutrition/ Hydration             ADL's   LB bathing/dressing bed level min/mod A, UB bathing/dressing w/c level min A, sit pivot transfers min/mod A, toileting max/dep  min A overall  trunk control/core strength, adl and transfer training with focus on toileting, patient/family education, DME   Mobility   min A bed mobility, min A to mod A slide board transfers, +2 sit to stand, w/c mobility S up to 70'  min A w/c level, no ambulation  transfers, w/c mobility, standing tolerance/balance, conditioning   Communication   Mod I  Mod I - goal met      Safety/Cognition/ Behavioral Observations  Min-Supervision A  Mod I, Supervision A - upgraded 3/22  complex problem solving, error/anticipatory awareness, alternating/divided attention and recall   Pain   Pt c/o of pain on neck radiating towards back.  Pain level <3/10  Assess Qshift and PRN   Skin   Closed incicison to back  no new breakdown  Assess Qshift and PRN     Discharge Planning:  Husband will need to begin family training-here daily. Pt wants to be mod/i but pleased with the progress she is making here.   Team Discussion: Patient requires cues to stay on tasks.   Patient on target to meet rehab goals: yes, min assist for lower body care and min- mod assist to don/doff shoes. Min assist for upper body care at a seated position. +2 assist needed for  toileting and transfers with a lift and min assist for slide-board transfers.  *See Care Plan and progress notes for long and short-term goals.   Revisions to Treatment Plan:  Updated SLP goals  Working on complex problem solving, anticipatory awareness and alternating attention Teaching Needs: Transfers, toileting, medications, etc.   Current Barriers to Discharge: Decreased caregiver support, Home enviroment access/layout and IV antibiotics  Possible Resolutions to Barriers: Assess need  for power chair vs custom wheelchair Family education; recommending + 2 assist for safety     Medical Summary Current Status: Decreased functional ability secondary to acute encephalopathy  Barriers to Discharge: Medical stability;Behavior;IV antibiotics;Incontinence   Possible Resolutions to [employer] Focus: Therapies, Optimize DM/HTN meds, follow labs - Hb, continue bowel/bladder meds   Continued Need for Acute Rehabilitation Level of Care: The patient requires daily medical management by a physician with specialized training in physical medicine and rehabilitation for the following reasons: Direction of a multidisciplinary physical rehabilitation program to maximize functional independence : Yes Medical management of patient stability for increased activity during participation in an intensive rehabilitation regime.: Yes Analysis of laboratory values and/or radiology reports with any subsequent need for medication adjustment and/or medical intervention. : Yes   I attest that I was present, lead the team conference, and concur with the assessment and plan of the team.   [name] 07/16/2020, 4:28 PM

## 2020-07-16 NOTE — Progress Notes (Signed)
Patient ID: Tonya Myers, female   DOB: 04-15-1966, 55 y.o.   MRN: [id number]  Met with pt and her sister who is here in her room to inform team conference progress toward her goals. The discharge is still 3/31. Pt does not want this moved therapists have mentioned extending it. She is pleased with progress this week and aware of the wheelchair evaluation tomorrow. Will begin having husband start hands on care/education in preparation of discharge 3/31. He will be here tomorrow according to pt.

## 2020-07-16 NOTE — Progress Notes (Signed)
Speech Language Pathology Daily Session Note  Patient Details  Name: Parissa Chiao MRN: [id number] Date of Birth: May 12, 1965  Today's Date: 07/16/2020 SLP Individual Time: 1415-1456 SLP Individual Time Calculation (min): 41 min  Short Term Goals: Week 2: SLP Short Term Goal 1 (Week 2): Patient will demonstrate alternating attention during mild complex level tasks with minA cues. SLP Short Term Goal 2 (Week 2): Patient will demonstrate awareness to errors in task performance with minA cues. SLP Short Term Goal 3 (Week 2): Patient will complete mildly complex information organization task with minA cues. SLP Short Term Goal 4 (Week 2): Patient will demonstrate recall of therapeutic and medical interventions with minA cues.  Skilled Therapeutic Interventions:Skilled ST services focused on cognitive skills. SLP facilitated complex problem solving skills in now familiar deductive reasoning and scheduling tasks, in which pt dictated information as SLP recorded due to continue numbness in hands. Pt required Min A fading to supervision A verbal cues for problem solving and error awareness with ability to correct errors. Pt demonstrated alternating attention in conversation and complex tasks with supervision A verbal cues. Pt was left in room with call bell within reach and bed alarm set. SLP recommends to continue skilled services.     Pain Pain Assessment Pain Score: Asleep  Therapy/Group: Individual Therapy  [place]  [facility] 07/16/2020, 7:33 AM

## 2020-07-17 DIAGNOSIS — I10 Essential (primary) hypertension: Secondary | ICD-10-CM | POA: Diagnosis not present

## 2020-07-17 DIAGNOSIS — G9340 Encephalopathy, unspecified: Secondary | ICD-10-CM | POA: Diagnosis not present

## 2020-07-17 DIAGNOSIS — A6004 Herpesviral vulvovaginitis: Secondary | ICD-10-CM | POA: Diagnosis not present

## 2020-07-17 DIAGNOSIS — G061 Intraspinal abscess and granuloma: Secondary | ICD-10-CM | POA: Diagnosis not present

## 2020-07-17 LAB — BASIC METABOLIC PANEL
Anion gap: 8 (ref 5–15)
BUN: 5 mg/dL — ABNORMAL LOW (ref 6–20)
CO2: 27 mmol/L (ref 22–32)
Calcium: 8.8 mg/dL — ABNORMAL LOW (ref 8.9–10.3)
Chloride: 105 mmol/L (ref 98–111)
Creatinine, Ser: 0.65 mg/dL (ref 0.44–1.00)
GFR, Estimated: >60 mL/min (ref >60)
Glucose, Bld: 86 mg/dL (ref 70–99)
Potassium: 3.9 mmol/L (ref 3.5–5.1)
Sodium: 140 mmol/L (ref 135–145)

## 2020-07-17 LAB — GLUCOSE, CAPILLARY
Glucose-Capillary: 88 mg/dL (ref 70–99)
Glucose-Capillary: 182 mg/dL — ABNORMAL HIGH (ref 70–99)
Glucose-Capillary: 106 mg/dL — ABNORMAL HIGH (ref 70–99)
Glucose-Capillary: 144 mg/dL — ABNORMAL HIGH (ref 70–99)
Glucose-Capillary: 88 mg/dL (ref 70–99)

## 2020-07-17 MED ORDER — EZETIMIBE 10 MG PO TABS
10.0000 mg | ORAL_TABLET | Freq: Every day | ORAL | Status: DC
  Administered 2020-07-17 – 2020-07-31 (×15): 10 mg via ORAL
  Filled 2020-07-17 (×15): qty 1

## 2020-07-17 MED ORDER — ROSUVASTATIN CALCIUM 5 MG PO TABS
10.0000 mg | ORAL_TABLET | Freq: Every day | ORAL | Status: DC
  Administered 2020-07-17 – 2020-07-31 (×15): 10 mg via ORAL
  Filled 2020-07-17 (×15): qty 2

## 2020-07-17 NOTE — Progress Notes (Signed)
Patient ID: Tonya Myers, female   DOB: 1966-03-07, 55 y.o.   MRN: [id number] Spoke with [place] via phone regarding preparation for discharge. Concerned about Hh aide to assist family with care and transportation to/from MD visits and hospital bed/BSC, DME for home/delivery to home and what the family needs to do to prepare for the discharge 07/24/20. She will be in [place] on Tuesday to discuss. Forwarded concerns and questions to SW; [place] for follow up coordination. [name]

## 2020-07-17 NOTE — Progress Notes (Signed)
Speech Language Pathology Weekly Progress and Session Note  Patient Details  Name: Tonya Myers MRN: [id number] Date of Birth: 08-03-1965  Beginning of progress report period: 07/17/2020 End of progress report period: 07/24/2020  Today's Date: 07/17/2020 SLP Individual Time: 1005-1055 SLP Individual Time Calculation (min): 50 min  Short Term Goals: Week 2: SLP Short Term Goal 1 (Week 2): Patient will demonstrate alternating attention during mild complex level tasks with minA cues. SLP Short Term Goal 1 - Progress (Week 2): Met SLP Short Term Goal 2 (Week 2): Patient will demonstrate awareness to errors in task performance with minA cues. SLP Short Term Goal 2 - Progress (Week 2): Met SLP Short Term Goal 3 (Week 2): Patient will complete mildly complex information organization task with minA cues. SLP Short Term Goal 3 - Progress (Week 2): Met SLP Short Term Goal 4 (Week 2): Patient will demonstrate recall of therapeutic and medical interventions with minA cues. SLP Short Term Goal 4 - Progress (Week 2): Met    New Short Term Goals: Week 3: SLP Short Term Goal 1 (Week 3): STG=LTG's (ELOS: 3/31)  Weekly Progress Updates:  Patient has made excellent progress and met all 4 STG's. Her LTG's were able to be upgraded from minA to supervision but has potential for mod I.  Patient has specifically made improvements in areas of awareness, problem solving and alternating attention.   Intensity: Minumum of 1-2 x/day, 30 to 90 minutes Frequency: 3 to 5 out of 7 days Duration/Length of Stay: 3/31 Treatment/Interventions: Cognitive remediation/compensation;Cueing hierarchy;Functional tasks;Medication managment;Internal/external aids;Patient/family education;Speech/Language facilitation   Daily Session  Skilled Therapeutic Interventions: Patient seen for skilled ST session with husband present in room. Portion of session spent discussing discharge plan with patient and husband. Patient  demonstrated good anticipatory awareness, telling SLP that she didn't want to burden her husband with her physical impairments, asking about home health aide, etc. Patient also participated in completing complex level problem solving and attention task of simulated insurance comparison and required minA verbal cues to start and then supervision to minA cues to complete. Patient continues to benefit from skilled SLP intervention to maximize cognitive-linguistic goals prior to discharge.     General    Pain Pain Assessment Pain Scale: 0-10 Pain Score: 4  Pain Type: Acute pain Pain Location: Leg Pain Orientation: Left Pain Descriptors / Indicators: Aching;Cramping;Shooting Pain Intervention(s): Other (Comment) (patient able to reposition self)  Therapy/Group: Individual Therapy  [name], MA, CCC-SLP Speech Therapy

## 2020-07-17 NOTE — Progress Notes (Signed)
Physical Therapy Session Note  Patient Details  Name: Tonya Myers MRN: [id number] Date of Birth: 25-Sep-1965  Today's Date: 07/17/2020 PT Individual Time: 1300-1421 PT Individual Time Calculation (min): 81 min   Short Term Goals: Week 3:  PT Short Term Goal 1 (Week 3): Patient to perform squat pivot transfers consistently with +1 A. PT Short Term Goal 2 (Week 3): Patient to perform sit to stand from elevated mat with +2 mod A PT Short Term Goal 3 (Week 3): Patient to propel w/c x 100' with S. PT Short Term Goal 4 (Week 3): Patient able to participate in w/c set up for transfer with min cues. PT Short Term Goal 5 (Week 3): Patient to tolerate standing in device x 75 seconds to allow for standing ADL's.  Skilled Therapeutic Interventions/Progress Updates:    Patient in supine with spouse present.  Seen in conjunction with seating specialist from [facility], Deatra Ina for wheelchair assessment.  Discussed issues with [name] regarding energy levels, level of recovery so far with arms and legs and head/trunk control, ongoing tingling and numbness in hands and options considered for seating.  Patient in supine rolled with rail and S and side to sit min A.  Patient transferred to w/c with min A using slide board.  Propelled w/c x 40' prior to fatigue and assisted to gym with spouse and seating specialist present.  Discussed issues related to power versus manual custom chair and with transporting chair.  [name] offered option for low cost lift for pt's vehicle [employer] truck).  Patient and spouse agreeable to power seating and [name] discussed process.  Patient transferred to mat via slide board with min A.  Seated EOM for measurements and sitting balance.  [name] completed discussion and measurements and exited. Patient sit to side with min A and performed sidelying clamshell hip abduction x 15, rolled to back with S and performed hooklying marches with A x 15, bridging x 15 with LE's on ball,  then lateral trunk rotation with A x 15.  Used powder board for sidelying hip flexion/ext and knee flex/ext x 15 each side.  Patient performed side to sit with min A and educated spouse in bed mobility assist and he performed assisting to sidelying and back up to sit x 2 with good technique after demonstration.   Transferred via slide board to w/c while educating spouse about board placement, positioning w/c and body position to assist with transfer. Patient requesting to practice car transfer.  Patient transported to gym with car simulator and educated about transfers to truck from current chair with higher surface and pt attempted with mod A but did not continue stating unable at this time.  Did discuss power chair may be higher and can try again, but did complete transfer to simulated sedan height as pt's spouse has sedan as well and pt performed with max A for scooting and for managing legs.  Patient returned to w/c and transported to her room, left up in chair missing 9 minutes skilled PT to allow pt time to eat lunch prior to OT session since she rested through lunch today.  Spouse and sister in the room.   Therapy Documentation Precautions:  Precautions Precautions: Fall,Back Restrictions Weight Bearing Restrictions: No Other Position/Activity Restrictions: spinal General: PT Amount of Missed Time (min): 9 Minutes PT Missed Treatment Reason: Other (Comment) (wanting to eat lunch prior to OT) Pain: Pain Assessment Pain Scale: 0-10 Pain Score: 4  Pain Type: Acute pain Pain Location: Leg  Pain Orientation: Left Pain Descriptors / Indicators: Aching;Cramping;Shooting Pain Intervention(s): Other (Comment) (patient able to reposition self)    Therapy/Group: Individual Therapy  Reginia Naas  Magda Kiel, PT 07/17/2020, 5:45 PM

## 2020-07-17 NOTE — Progress Notes (Signed)
PROGRESS NOTE   Subjective/Complaints: Patient seen laying in bed this AM.  Significant other bedside.  She states she slept well overnight.  She states she was told overnight that her PICC was no longer functional, discussed with nursing, will need to follow-up.  ROS: Denies CP, SOB, N/V/D  Objective:   No results found. No results for input(s): WBC, HGB, HCT, PLT in the last 72 hours. Recent Labs    07/17/20 0452  NA 140  K 3.9  CL 105  CO2 27  GLUCOSE 86  BUN 5*  CREATININE 0.65  CALCIUM 8.8*    Intake/Output Summary (Last 24 hours) at 07/17/2020 1248 Last data filed at 07/17/2020 0854 Gross per 24 hour  Intake 240 ml  Output -  Net 240 ml        Physical Exam: Vital Signs Blood pressure 135/87, pulse 89, temperature 98.8 F (37.1 C), temperature source Oral, resp. rate 18, height 6' (1.829 m), weight 99.7 kg, SpO2 100 %. Constitutional: No distress . Vital signs reviewed. HENT: Normocephalic.  Atraumatic. Eyes: EOMI. No discharge. Cardiovascular: No JVD.  RRR. Respiratory: Normal effort.  No stridor.  Bilateral clear to auscultation. GI: Non-distended.  BS +. Skin: Warm and dry.  Intact. Psych: Normal mood.  Normal behavior. Musc: No edema in extremities.  No tenderness in extremities. Neurologic: Alert Motor: Bilateral upper extremities: 4/5 proximal distal, grip 4-/5, unchanged Bilateral lower extremities: Hip flexion 4/5, distally 4+/5, stable Bilateral upper extremity ataxia, some improvement  Assessment/Plan: 1. Functional deficits which require 3+ hours per day of interdisciplinary therapy in a comprehensive inpatient rehab setting.  Physiatrist is providing close team supervision and 24 hour management of active medical problems listed below.  Physiatrist and rehab team continue to assess barriers to discharge/monitor patient progress toward functional and medical goals  Care  Tool:  Bathing  Bathing activity did not occur: Refused Body parts bathed by patient: Right arm,Left arm,Chest,Abdomen,Front perineal area,Right upper leg,Left upper leg,Face   Body parts bathed by helper: Buttocks,Right lower leg,Left lower leg     Bathing assist Assist Level: Moderate Assistance - Patient 50 - 74%     Upper Body Dressing/Undressing Upper body dressing Upper body dressing/undressing activity did not occur (including orthotics): Refused What is the patient wearing?: Pull over shirt    Upper body assist Assist Level: Moderate Assistance - Patient 50 - 74%    Lower Body Dressing/Undressing Lower body dressing    Lower body dressing activity did not occur: Refused What is the patient wearing?: Pants,Incontinence brief     Lower body assist Assist for lower body dressing: Moderate Assistance - Patient 50 - 74%     Toileting Toileting Toileting Activity did not occur Landscape architect and hygiene only): Refused  Toileting assist Assist for toileting: 2 Helpers     Transfers Chair/bed transfer  Transfers assist     Chair/bed transfer assist level: Moderate Assistance - Patient 50 - 74%     Locomotion Ambulation   Ambulation assist      Assist level: 2 helpers Assistive device: Ethelene Hal Max distance: 15ft   Walk 10 feet activity   Assist  Walk 10 feet activity did not occur:  Safety/medical concerns        Walk 50 feet activity   Assist Walk 50 feet with 2 turns activity did not occur: Safety/medical concerns         Walk 150 feet activity   Assist Walk 150 feet activity did not occur: Safety/medical concerns         Walk 10 feet on uneven surface  activity   Assist Walk 10 feet on uneven surfaces activity did not occur: Safety/medical concerns         Wheelchair     Assist Will patient use wheelchair at discharge?: Yes Type of Wheelchair: Manual Wheelchair activity did not occur: Safety/medical  concerns  Wheelchair assist level: Supervision/Verbal cueing Max wheelchair distance: 39'    Wheelchair 50 feet with 2 turns activity    Assist    Wheelchair 50 feet with 2 turns activity did not occur: Safety/medical concerns   Assist Level: Supervision/Verbal cueing   Wheelchair 150 feet activity     Assist  Wheelchair 150 feet activity did not occur: Safety/medical concerns       Medical Problem List and Plan: 1.  Decreased functional ability secondary to acute encephalopathy.  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    Continue [name] 2.  Antithrombotics: -DVT/anticoagulation: Continue Lovenox 52.5 mg daily.              -antiplatelet therapy: N/A 3. Pain Management: Oxycodone as needed  Robaxin 500 3 times daily scheduled on 3/12, continue  Controlled with meds on 3/24 4. Mood: Team support             -antipsychotic agents: N/A 5. Neuropsych: This patient is not capable of making decisions on her own behalf.  Requires consistent positive reinforcement 6. Skin/Wound Care: Routine skin checks 7. Fluids/Electrolytes/Nutrition: Routine in and outs 8.  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  Continue IV Rocephin through 07/23/2020 and follow-up infectious disease. 9.  Diabetes mellitus with hyperglycemia/DKA.  Hemoglobin A1c 8.0.  Check blood sugars before meals and at bedtime  CBG (last 3)  Recent Labs    07/17/20 0614 07/17/20 0809 07/17/20 1145  GLUCAP 88 182* 106*   Lantus insulin 45 units daily, decreased to 40 on 3/16, decreased to 35 on 3/22  Decreased Novolog to 3U TID  Increased Metformin to 500mg  BID.   Slightly labile on 3/24 10.  CLL.  Follow-up outpatient Dr. Delight Hoh 11.  Hypothyroidism.   Synthroid 12.  Anemia of chronic disease.  Multifactorial.   Hemoglobin 8.0 on 3/21  Continue to monitor 13.  Hypertension.  Dilacor120 mg daily,Micardis 80-25 mg daily           Vitals:   07/16/20 2000 07/17/20 0429  BP: (!) 149/73 135/87  Pulse: 89 89  Resp: 18 18  Temp: 98.4 F (36.9 C) 98.8 F (37.1 C)  SpO2: 100% 100%   Relatively controlled on 3/24 14.  Transaminitis.    ALT elevated, trending down 3/14. 15.  Obesity.  BMI 31.19.  Dietary follow-up 16.  History of asthma.  Continue Singulair 10 mg nightly.   17.  History of genital herpes.  Conitnue  Valtrex 18.  Sleep disturbance  Melatonin  ECG reviewed, trazodone 25 nightly started on 3/12  Improving overall  19.  Slow transit constipation  Bowel meds increased on 3/9, increased again  on 3/11, decreased on 3/21 to optimize bowel function  Improving 20.  Hypokalemia  Potassium 3.9 on 3/24  Supplemented x2 days on 3/21 21.  Urinary retention  Flomax increased on 3/11  Continue I/O caths as necessary  Emptying now without significant residual 22. Tachycardia:   Continue Cardizem  Controlled on 3/24  LOS: 20 days A FACE TO FACE EVALUATION WAS PERFORMED  Ankit Lorie Phenix 07/17/2020, 12:48 PM

## 2020-07-17 NOTE — Progress Notes (Incomplete)
Pt said midline felt like it was leaking when midline was being flushed, called IV team for consult. Per IV team midline may not last through next week, and it would be a better for pt to have a PICC line. There is no blood return from midline, and pt is a hard stick.   [name]

## 2020-07-17 NOTE — Progress Notes (Signed)
Occupational Therapy Session Note  Patient Details  Name: Tonya Myers MRN: [id number] Date of Birth: [birth year]  Today's Date: 07/17/2020 OT Individual Time: 1445-1530 OT Individual Time Calculation (min): 45 min    Short Term Goals: Week 3:  OT Short Term Goal 1 (Week 3): patient will complete sit pivot transfers min A of one OT Short Term Goal 2 (Week 3): patient will complete side lying to/from sitting with min A of one OT Short Term Goal 3 (Week 3): patient will complete toileting max A of one OT Short Term Goal 4 (Week 3): patient will complete LB bathing and dressing mod A with ADs  Skilled Therapeutic Interventions/Progress Updates:    Pt received in room in wc with spouse present and sister present for first half of session. Discussed that pt has 1 week left of rehab and what her current concerns are. She said she is still worried about toileting skills and transfers. She has 2 BSC in her room - one she feels pinches her and the other could not be adjusted to a lower position.  Found another drop arm wide bariatric BSC with a flat surface that works well with a board. Brought that to her room.  Got pt prepared for transfer, set up board with spouse helping and then pt said "I am totally wiped out and my muscle strength is shot. I cant do this now".   Instead used demonstration with lateral leans onto adjacent surfaces to show spouse and pt techniques she can try to use tomorrow to allow for easier cleansing and clothing management.    Pt wanted to get back to bed.  Used board to bed with mod A and then mod to supine. Pt adjusted in bed with all needs met.    Therapy Documentation Precautions:  Precautions Precautions: Fall,Back Restrictions Weight Bearing Restrictions: No Other Position/Activity Restrictions: spinal    Pain: Pain Assessment Pain Scale: 0-10 Pain Score: 8  Faces Pain Scale: Hurts even more Pain Type: Acute pain Pain Location: Neck Pain Orientation:  Right;Left Pain Radiating Towards: back Pain Intervention(s): Repositioned    Therapy/Group: Individual Therapy  [place] 07/17/2020, 8:53 AM

## 2020-07-18 DIAGNOSIS — G9340 Encephalopathy, unspecified: Secondary | ICD-10-CM | POA: Diagnosis not present

## 2020-07-18 LAB — GLUCOSE, CAPILLARY
Glucose-Capillary: 84 mg/dL (ref 70–99)
Glucose-Capillary: 123 mg/dL — ABNORMAL HIGH (ref 70–99)
Glucose-Capillary: 140 mg/dL — ABNORMAL HIGH (ref 70–99)
Glucose-Capillary: 141 mg/dL — ABNORMAL HIGH (ref 70–99)

## 2020-07-18 NOTE — Progress Notes (Addendum)
PROGRESS NOTE   Subjective/Complaints: Patient seen laying in bed this morning.  She states she slept well overnight.  Significant other at bedside.  She did state that she had a headache overnight, but it resolved with medication.  ROS: Denies CP, SOB, N/V/D  Objective:   No results found. No results for input(s): WBC, HGB, HCT, PLT in the last 72 hours. Recent Labs    07/17/20 0452  NA 140  K 3.9  CL 105  CO2 27  GLUCOSE 86  BUN 5*  CREATININE 0.65  CALCIUM 8.8*    Intake/Output Summary (Last 24 hours) at 07/18/2020 1122 Last data filed at 07/18/2020 0720 Gross per 24 hour  Intake 537 ml  Output --  Net 537 ml        Physical Exam: Vital Signs Blood pressure (!) 119/59, pulse 87, temperature 98.8 F (37.1 C), temperature source Oral, resp. rate 18, height 6' (1.829 m), weight 99.7 kg, SpO2 98 %. Constitutional: No distress . Vital signs reviewed. HENT: Normocephalic.  Atraumatic. Eyes: EOMI. No discharge. Cardiovascular: No JVD.  RRR. Respiratory: Normal effort.  No stridor.  Bilateral clear to auscultation. GI: Non-distended.  BS +. Skin: Warm and dry.  Intact. Psych: Normal mood.  Normal behavior. Musc: No edema in extremities.  No tenderness in extremities. Neurologic: Alert Motor: Bilateral upper extremities: 4/5 proximal distal, grip 4-/5, stable Bilateral lower extremities: Hip flexion 4/5, distally 4+/5, unchanged Bilateral upper extremity ataxia, some improvement  Assessment/Plan: 1. Functional deficits which require 3+ hours per day of interdisciplinary therapy in a comprehensive inpatient rehab setting.  Physiatrist is providing close team supervision and 24 hour management of active medical problems listed below.  Physiatrist and rehab team continue to assess barriers to discharge/monitor patient progress toward functional and medical goals  Care Tool:  Bathing  Bathing activity did not  occur: Refused Body parts bathed by patient: Right arm,Left arm,Chest,Abdomen,Front perineal area,Right upper leg,Left upper leg,Face   Body parts bathed by helper: Buttocks,Right lower leg,Left lower leg     Bathing assist Assist Level: Moderate Assistance - Patient 50 - 74%     Upper Body Dressing/Undressing Upper body dressing Upper body dressing/undressing activity did not occur (including orthotics): Refused What is the patient wearing?: Pull over shirt    Upper body assist Assist Level: Set up assist    Lower Body Dressing/Undressing Lower body dressing    Lower body dressing activity did not occur: Refused What is the patient wearing?: Pants     Lower body assist Assist for lower body dressing: Maximal Assistance - Patient 25 - 49%     Toileting Toileting Toileting Activity did not occur Landscape architect and hygiene only): Refused  Toileting assist Assist for toileting: 2 Helpers     Transfers Chair/bed transfer  Transfers assist     Chair/bed transfer assist level: Moderate Assistance - Patient 50 - 74%     Locomotion Ambulation   Ambulation assist      Assist level: 2 helpers Assistive device: Ethelene Hal Max distance: 71ft   Walk 10 feet activity   Assist  Walk 10 feet activity did not occur: Safety/medical concerns  Walk 50 feet activity   Assist Walk 50 feet with 2 turns activity did not occur: Safety/medical concerns         Walk 150 feet activity   Assist Walk 150 feet activity did not occur: Safety/medical concerns         Walk 10 feet on uneven surface  activity   Assist Walk 10 feet on uneven surfaces activity did not occur: Safety/medical concerns         Wheelchair     Assist Will patient use wheelchair at discharge?: Yes Type of Wheelchair: Power Wheelchair activity did not occur: Safety/medical concerns  Wheelchair assist level: Supervision/Verbal cueing Max wheelchair distance: 40'     Wheelchair 50 feet with 2 turns activity    Assist    Wheelchair 50 feet with 2 turns activity did not occur: Safety/medical concerns   Assist Level: Supervision/Verbal cueing   Wheelchair 150 feet activity     Assist  Wheelchair 150 feet activity did not occur: Safety/medical concerns       Medical Problem List and Plan: 1.  Decreased functional ability secondary to acute encephalopathy.  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    Continue [name]  The patient is seen for a wheelchair evaluation. I have reviewed and concur with physical therapy's wheelchair evaluation.  Due to significant functional debility with acute encephalopathy secondary to meningitis with spinal abscesses this patient is unable to use a cane or walker. A manual wheelchair would provide increased functional mobility and allow the patient to be more independent with basic self-care tasks and mobility within the home and the community. The patient displays adequate cognitive and motor skills to safely operate this wheelchair. The patient is certainly motivated to utilize a manual wheelchair for the purpose of maintaining independence at home and in the community.     2.  Antithrombotics: -DVT/anticoagulation: Continue Lovenox 52.5 mg daily.              -antiplatelet therapy: N/A 3. Pain Management: Oxycodone as needed  Robaxin 500 3 times daily scheduled on 3/12, continue  Controlled with meds on 3/25 4. Mood: Team support             -antipsychotic agents: N/A 5. Neuropsych: This patient is not capable of making decisions on her own behalf.  Requires consistent positive reinforcement 6. Skin/Wound Care: Routine skin checks 7. Fluids/Electrolytes/Nutrition: Routine in and outs 8.   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  Continue IV Rocephin through 07/23/2020 and follow-up infectious disease. 9.  Diabetes mellitus with hyperglycemia/DKA.  Hemoglobin A1c 8.0.  Check blood sugars before meals and at bedtime  CBG (last 3)  Recent Labs    07/17/20 1646 07/17/20 2146 07/18/20 0623  GLUCAP 144* 88 84   Lantus insulin 45 units daily, decreased to 40 on 3/16, decreased to 35 on 3/22  Decreased Novolog to 3U TID  Increased Metformin to 500mg  BID.   Relatively controlled on 3/25 10.  CLL.  Follow-up outpatient Dr. Delight Hoh 11.  Hypothyroidism.  Synthroid 12.  Anemia of chronic disease.  Multifactorial.   Hemoglobin 8.0 on 3/21, labs ordered for Monday  Continue to monitor 13.  Hypertension.  Dilacor 120 mg daily,Micardis 80-25 mg daily           Vitals:   07/17/20 2003 07/18/20 0534  BP: 116/78 (!) 119/59  Pulse: 98 87  Resp: 18 18  Temp: 98.9 F (37.2 C) 98.8 F (37.1 C)  SpO2: 99% 98%   Controlled on 3/25 14.  Transaminitis.    ALT elevated, trending down 3/14, labs ordered for Monday. 15.  Obesity.  BMI 31.19.  Dietary follow-up 16.  History of asthma.  Continue Singulair 10 mg nightly.   17.  History of genital herpes.  Conitnue  Valtrex 18.  Sleep disturbance  Melatonin  ECG reviewed, trazodone 25 nightly started on 3/12  Improving overall  19.  Slow transit constipation  Bowel meds increased on 3/9, increased again on 3/11, decreased on 3/21 to optimize bowel function  Improving 20.  Hypokalemia  Potassium 3.9 on 3/24, labs ordered for Monday  Supplemented x2 days on 3/21 21.  Urinary retention  Flomax increased on 3/11  Continue I/O caths as necessary  Emptying now without significant residual 22. Tachycardia:   Continue Cardizem  Controlled on 3/25  LOS: 21 days A FACE TO FACE EVALUATION WAS PERFORMED  Ethelle Ola Lorie Phenix 07/18/2020, 11:22 AM

## 2020-07-18 NOTE — Progress Notes (Signed)
Occupational Therapy Session Note  Patient Details  Name: Tonya Myers MRN: [id number] Date of Birth: Mar 20, 1966  Today's Date: 07/18/2020 OT Individual Time: [phone] OT Individual Time Calculation (min): 59 min   Skilled Therapeutic Interventions/Progress Updates:    Pt greeted in bed, c/o having a lot of back pain last night and having trouble sleeping. Still very motivated to participate in tx. Her husband Tonya Myers set her up with a fruit cup and pt was able to spear food herself while holding cup and limiting degrees of freedom as needed, noted ataxic UE movements while eating her fruit which pt was aware of. Per pt and Tonya Myers, they will have a hospital bed at home. Worked on caregiver education while OT instructed spouse how to set up the bed to help pt don pants bedlevel while optimizing caregiver body mechanics. Worked on logrolling Lt>Rt while spouse assisted with elevating pants over hips, dressed the weaker Lt leg first beforehand. Next, Tonya Myers assisted pt with logrolling towards the Rt side before completing supine<sit. Vcs for having feet on the floor before they set up slideboard transfer together given min cuing. Discussed importance of head/hip relationship and anterior weight shift. Then Tonya Myers assisted pt with transferring to the w/c with OT stabilizing the board and w/c. Pt needed vcs to increase attention to LE placement during transfer for safety. While seated at the sink, pt completed oral care with setup assistance. She doffed shirt and donned clean shirt with setup assist and increased time! We celebrated! RN also in to provide pain medicine per pt request. OT then set up slideboard transfer back to bed and Tonya Myers assisted pt with transfer once again. Note that pt had 1 small posterior LOB because she did not weight shift forward enough during 1 scoot, Min A from OT to correct LOB. Pt able to direct her caregiver in regards to sit<supine. +2 for boosting up in bed. Recommended  to pt and Tonya Myers getting a chuck pad for their hospital bed at home to help with bed mobility/optimizing caregiver body mechanics. Both very appreciative of OT education and opportunity for hands on family training. Pt remained in bed at close of session, instant hot packs provided for additional pain relief per pt request. 4 bedrails up per pt request, all needs within reach and bed alarm set.   Due to heightened pain today, pt opted to practice [facility] transfer during later therapy session   Therapy Documentation Precautions:  Precautions Precautions: Fall,Back Restrictions Weight Bearing Restrictions: No Other Position/Activity Restrictions: spinal ADL: ADL Eating: Set up Grooming: Setup Upper Body Bathing: Minimal assistance Lower Body Bathing: Maximal assistance Upper Body Dressing: Moderate assistance Lower Body Dressing: Dependent Toileting: Dependent Where Assessed-Toileting: Bedside Commode Toilet Transfer: Moderate assistance Toilet Transfer Method: Theatre manager: Drop arm bedside commode ADL Comments: Unable to assess any ADL areas due to pts unwillingness to participate/agitation and aggressive behaviors      Therapy/Group: Individual Therapy  Tonya Myers 07/18/2020, 11:13 AM

## 2020-07-18 NOTE — Progress Notes (Signed)
Patient ID: Tonya Myers, female   DOB: [birth year], 55 y.o.   MRN: [id number]  Met with pt and [name] to answer their questions regarding equipment. She will need a hospital bed, transfer board and wheelchair coming from [place]. Made aware wide drop-arm bedside commode is not covered due to she is not over 300 lbs. They will get this on their own. Informed of on-line resources for this. Gave transportation information on [place], [name] to follow up on. Therapists recommend home health and to transition to OP when moving better and less fatigued. Both in agreement with this. Will go ahead and order the equipment so can be delivered prior to discharge next week. Aware if further questions come up to contact me.

## 2020-07-18 NOTE — Progress Notes (Signed)
Speech Language Pathology Daily Session Note  Patient Details  Name: Tonya Myers MRN: [id number] Date of Birth: [birth year]  Today's Date: 07/18/2020 SLP Individual Time: 1015-1110 SLP Individual Time Calculation (min): 55 min  Short Term Goals: Week 3: SLP Short Term Goal 1 (Week 3): STG=LTG's (ELOS: 3/31)  Skilled Therapeutic Interventions:   Patient seen for skilled ST session with focus on cognitive testing via CLQT. She received scores in "Mild" range for domains: Attention (176), Memory (169). She continues to demonstrate steady progress with all areas of her cognitive functioning and is on track to meet upgraded LTG's at time of discharge next week.  Pain Pain Assessment Pain Scale: 0-10 Pain Score: 0-No pain Pain Type: Acute pain Pain Location: Hip Pain Orientation: Left Pain Descriptors / Indicators: Sore Pain Onset: With Activity Pain Intervention(s): Repositioned  Therapy/Group: Individual Therapy  [name], MA, CCC-SLP Speech Therapy

## 2020-07-18 NOTE — Progress Notes (Signed)
Physical Therapy Session Note  Patient Details  Name: Tonya Myers MRN: [id number] Date of Birth: 1965-07-01  Today's Date: 07/18/2020 PT Individual Time:Session1: 0459-9774; Session2: 1530-1600 PT Individual Time Calculation (min): 45 min & 30 min  Short Term Goals: Week 3:  PT Short Term Goal 1 (Week 3): Patient to perform squat pivot transfers consistently with +1 A. PT Short Term Goal 2 (Week 3): Patient to perform sit to stand from elevated mat with +2 mod A PT Short Term Goal 3 (Week 3): Patient to propel w/c x 100' with S. PT Short Term Goal 4 (Week 3): Patient able to participate in w/c set up for transfer with min cues. PT Short Term Goal 5 (Week 3): Patient to tolerate standing in device x 75 seconds to allow for standing ADL's.  Skilled Therapeutic Interventions/Progress Updates:    Session1:  Patient in supine and reports Tonya Myers helped with transfer earlier and due to board placement L butt cheek is sore.  Asked if we can try standing later and agreeable to try Nu Step this session.  Patient rolled and side to sit with Tonya Myers helping min A.  Performed slide board transfer to w/c on R side with Tonya Myers helping min to mod A and mod cues for Tonya Myers positioning more squated so pt can lean forward. Patient in w/c propelled about 66' then asked for assistance so pushed in w/c to dayroom.  Performed slide board transfer to Nu Step with +2 A for safety and pt performed UE/LE on Nu Step x 8 minutes with one rest break at level 1.  SBT back to w/c with +2 A for safety and pt pushed in w/c to room.  Left up in w/c for lunch and let nursing staff know she will need back to bed after eating to prep for 2 pm OT session.  Pt's spouse in the room and encouraged to try self feeding with b/s table close and elbows supported.  Tonya Myers:  Patient in bed and performed supine to sit with min a and increased time.  Sister reports spouse left to rest at pt's bidding even though he wanted to practice  more.  Patient SBT to w/c with mod A.  Assisted in w/c to general gym and transferred to mat with mod A.  Performed sit to stand x 2 with lift Tonya Myers) and able to maintain standing for 1 minute, then 1 minute 10 seconds prior to sitting.  Noted increased upright posture through trunk, shoulders and neck throughout standing.  Patient transferred to w/c mod A and pushed in w/c to room with pt requesting back to bed.  SBT to bed mod A and sit to supine mod A.  Left with call bell in reach, sister in the room and bed alarm active.    Therapy Documentation Precautions:  Precautions Precautions: Fall,Back Restrictions Weight Bearing Restrictions: No Other Position/Activity Restrictions: spinal Pain: Pain Assessment Pain Score: 7  Pain Type: Acute pain Pain Location: Hip Pain Orientation: Left Pain Descriptors / Indicators: Sore Pain Onset: With Activity Pain Intervention(s): Repositioned    Therapy/Group: Individual Therapy  Tonya Myers  Tonya Myers, PT 07/18/2020, 12:56 PM

## 2020-07-19 LAB — GLUCOSE, CAPILLARY
Glucose-Capillary: 85 mg/dL (ref 70–99)
Glucose-Capillary: 108 mg/dL — ABNORMAL HIGH (ref 70–99)
Glucose-Capillary: 145 mg/dL — ABNORMAL HIGH (ref 70–99)
Glucose-Capillary: 126 mg/dL — ABNORMAL HIGH (ref 70–99)

## 2020-07-20 DIAGNOSIS — I10 Essential (primary) hypertension: Secondary | ICD-10-CM | POA: Diagnosis not present

## 2020-07-20 DIAGNOSIS — D638 Anemia in other chronic diseases classified elsewhere: Secondary | ICD-10-CM | POA: Diagnosis not present

## 2020-07-20 DIAGNOSIS — G061 Intraspinal abscess and granuloma: Secondary | ICD-10-CM | POA: Diagnosis not present

## 2020-07-20 DIAGNOSIS — G9340 Encephalopathy, unspecified: Secondary | ICD-10-CM | POA: Diagnosis not present

## 2020-07-20 LAB — GLUCOSE, CAPILLARY
Glucose-Capillary: 82 mg/dL (ref 70–99)
Glucose-Capillary: 109 mg/dL — ABNORMAL HIGH (ref 70–99)
Glucose-Capillary: 165 mg/dL — ABNORMAL HIGH (ref 70–99)
Glucose-Capillary: 166 mg/dL — ABNORMAL HIGH (ref 70–99)

## 2020-07-20 NOTE — Progress Notes (Signed)
PROGRESS NOTE   Subjective/Complaints: Patient seen laying in bed this morning.  She states she slept well overnight.  Significant other at bedside.  She did state that she had a headache overnight, but it resolved with medication.  Patient seen laying in bed this morning.  She states she slept well overnight.  She states she would like to consider extending her length of stay.  ROS: Denies CP, SOB, N/V/D  Objective:   No results found. No results for input(s): WBC, HGB, HCT, PLT in the last 72 hours. No results for input(s): NA, K, CL, CO2, GLUCOSE, BUN, CREATININE, CALCIUM in the last 72 hours.  Intake/Output Summary (Last 24 hours) at 07/20/2020 1838 Last data filed at 07/20/2020 1300 Gross per 24 hour  Intake 240 ml  Output -  Net 240 ml        Physical Exam: Vital Signs Blood pressure 130/69, pulse 98, temperature 98.6 F (37 C), resp. rate 17, height 6' (1.829 m), weight 99.7 kg, SpO2 97 %. Constitutional: No distress . Vital signs reviewed. HENT: Normocephalic.  Atraumatic. Eyes: EOMI. No discharge. Cardiovascular: No JVD.  RRR. Respiratory: Normal effort.  No stridor.  Bilateral clear to auscultation. GI: Non-distended.  BS +. Skin: Warm and dry.  Intact. Psych: Normal mood.  Normal behavior. Musc: No edema in extremities.  No tenderness in extremities. Neurologic: Alert Motor: Bilateral upper extremities: 4/5 proximal distal, grip 4-/5, stable Bilateral lower extremities: Hip flexion 4/5, distally 4+/5, stable Bilateral upper extremity ataxia, some improvement  Assessment/Plan: 1. Functional deficits which require 3+ hours per day of interdisciplinary therapy in a comprehensive inpatient rehab setting.  Physiatrist is providing close team supervision and 24 hour management of active medical problems listed below.  Physiatrist and rehab team continue to assess barriers to discharge/monitor patient progress  toward functional and medical goals  Care Tool:  Bathing  Bathing activity did not occur: Refused Body parts bathed by patient: Right arm,Left arm,Chest,Abdomen,Front perineal area,Right upper leg,Left upper leg,Face   Body parts bathed by helper: Buttocks,Right lower leg,Left lower leg     Bathing assist Assist Level: Moderate Assistance - Patient 50 - 74%     Upper Body Dressing/Undressing Upper body dressing Upper body dressing/undressing activity did not occur (including orthotics): Refused What is the patient wearing?: Pull over shirt    Upper body assist Assist Level: Set up assist    Lower Body Dressing/Undressing Lower body dressing    Lower body dressing activity did not occur: Refused What is the patient wearing?: Pants     Lower body assist Assist for lower body dressing: Maximal Assistance - Patient 25 - 49%     Toileting Toileting Toileting Activity did not occur Landscape architect and hygiene only): Refused  Toileting assist Assist for toileting: 2 Helpers     Transfers Chair/bed transfer  Transfers assist     Chair/bed transfer assist level: Moderate Assistance - Patient 50 - 74%     Locomotion Ambulation   Ambulation assist      Assist level: 2 helpers Assistive device: Ethelene Hal Max distance: 55ft   Walk 10 feet activity   Assist  Walk 10 feet activity did not occur: Safety/medical  concerns        Walk 50 feet activity   Assist Walk 50 feet with 2 turns activity did not occur: Safety/medical concerns         Walk 150 feet activity   Assist Walk 150 feet activity did not occur: Safety/medical concerns         Walk 10 feet on uneven surface  activity   Assist Walk 10 feet on uneven surfaces activity did not occur: Safety/medical concerns         Wheelchair     Assist Will patient use wheelchair at discharge?: Yes Type of Wheelchair: Power Wheelchair activity did not occur: Safety/medical  concerns  Wheelchair assist level: Supervision/Verbal cueing Max wheelchair distance: 30'    Wheelchair 50 feet with 2 turns activity    Assist    Wheelchair 50 feet with 2 turns activity did not occur: Safety/medical concerns   Assist Level: Supervision/Verbal cueing   Wheelchair 150 feet activity     Assist  Wheelchair 150 feet activity did not occur: Safety/medical concerns       Medical Problem List and Plan: 1.  Decreased functional ability secondary to acute encephalopathy.  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    Continue [name] 2.  Antithrombotics: -DVT/anticoagulation: Continue Lovenox 52.5 mg daily.              -antiplatelet therapy: N/A 3. Pain Management: Oxycodone as needed  Robaxin 500 3 times daily scheduled on 3/12, continue  Controlled with meds on 3/27 4. Mood: Team support             -antipsychotic agents: N/A 5. Neuropsych: This patient is not capable of making decisions on her own behalf.  Requires consistent positive reinforcement 6. Skin/Wound Care: Routine skin checks 7. Fluids/Electrolytes/Nutrition: Routine in and outs 8.  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  Continue IV Rocephin through 07/23/2020 and follow-up infectious disease. 9.  Diabetes mellitus with hyperglycemia/DKA.  Hemoglobin A1c 8.0.  Check blood sugars before meals and at bedtime  CBG (last 3)  Recent Labs    07/20/20 0617 07/20/20 1132 07/20/20 1636  GLUCAP 82 109* 165*   Lantus insulin 45 units daily, decreased to 40 on 3/16, decreased to 35 on 3/22  Decreased Novolog to 3U TID  Increased Metformin to 500mg  BID.   Labile on 3/27 10.  CLL.  Follow-up outpatient Dr. Delight Hoh 11.  Hypothyroidism.  Synthroid 12.   Anemia of chronic disease.  Multifactorial.   Hemoglobin 8.0 on 3/21, labs ordered for tomorrow  Continue to monitor 13.  Hypertension.  Dilacor 120 mg daily,Micardis 80-25 mg daily           Vitals:   07/20/20 0432 07/20/20 1443  BP: 135/82 130/69  Pulse: 87 98  Resp: 17 17  Temp: 98.3 F (36.8 C) 98.6 F (37 C)  SpO2: 100% 97%   Controlled on 3/27 14.  Transaminitis.    ALT elevated, trending down 3/14, labs ordered for tomorrow. 15.  Obesity.  BMI 31.19.  Dietary follow-up 16.  History of asthma.  Continue Singulair 10 mg nightly.   17.  History of genital herpes.  Conitnue Valtrex 18.  Sleep disturbance  Melatonin  ECG reviewed, trazodone 25 nightly started on 3/12  Improving overall  19.  Slow transit constipation  Bowel meds increased  on 3/9, increased again on 3/11, decreased on 3/21 to optimize bowel function  Improving 20.  Hypokalemia  Potassium 3.9 on 3/24, labs ordered for tomorrow  Supplemented x2 days on 3/21 21.  Urinary retention  Flomax increased on 3/11  Continue I/O caths as necessary  Emptying now without significant residual 22. Tachycardia:   Continue Cardizem  Controlled on 3/25  LOS: 23 days A FACE TO FACE EVALUATION WAS PERFORMED  Ankit Lorie Phenix 07/20/2020, 6:38 PM

## 2020-07-20 NOTE — Progress Notes (Signed)
Occupational Therapy Session Note  Patient Details  Name: Tonya Myers MRN: [id number] Date of Birth: 10-30-1965  Today's Date: 07/20/2020 OT Individual Time:  - 60 minutes missed     Skilled Therapeutic Interventions/Progress Updates:    Pt greeted in bed, c/o severe abdominal discomfort due to constipation. She wanted to have RN give her a suppository. Suggested transferring to the [facility] before suppository was inserted with pt reporting that she was in too much pain to transfer, wanting to use the bedpan instead for this. Treatment time missed due to nursing care, pt left in care of nursing staff.  Note that OT returned later in the day to check on pt who was sleeping. Per relative at bedside, [name], pt was very tired and would prefer if her makeup therapy session was tomorrow.  Therapy Documentation Precautions:  Precautions Precautions: Fall,Back Restrictions Weight Bearing Restrictions: No Other Position/Activity Restrictions: spinal Vital Signs: Therapy Vitals Temp: 98.3 F (36.8 C) Pulse Rate: 87 Resp: 17 BP: 135/82 Patient Position (if appropriate): Lying Oxygen Therapy SpO2: 100 % O2 Device: Room Air ADL: ADL Eating: Set up Grooming: Setup Upper Body Bathing: Minimal assistance Lower Body Bathing: Maximal assistance Upper Body Dressing: Moderate assistance Lower Body Dressing: Dependent Toileting: Dependent Where Assessed-Toileting: Bedside Commode Toilet Transfer: Moderate assistance Toilet Transfer Method: Theatre manager: Drop arm bedside commode ADL Comments: Unable to assess any ADL areas due to pts unwillingness to participate/agitation and aggressive behaviors      Therapy/Group: Individual Therapy  Ahmadou Bolz A [place] Jakubiak 07/20/2020, 7:32 AM

## 2020-07-21 ENCOUNTER — Inpatient Hospital Stay (HOSPITAL_COMMUNITY): Payer: Medicare Other

## 2020-07-21 LAB — COMPREHENSIVE METABOLIC PANEL
ALT: 29 U/L (ref 0–44)
AST: 19 U/L (ref 15–41)
Albumin: 2.5 g/dL — ABNORMAL LOW (ref 3.5–5.0)
Alkaline Phosphatase: 120 U/L (ref 38–126)
Anion gap: 8 (ref 5–15)
BUN: 5 mg/dL — ABNORMAL LOW (ref 6–20)
CO2: 25 mmol/L (ref 22–32)
Calcium: 8.8 mg/dL — ABNORMAL LOW (ref 8.9–10.3)
Chloride: 105 mmol/L (ref 98–111)
Creatinine, Ser: 0.71 mg/dL (ref 0.44–1.00)
GFR, Estimated: >60 mL/min (ref >60)
Glucose, Bld: 151 mg/dL — ABNORMAL HIGH (ref 70–99)
Potassium: 3.7 mmol/L (ref 3.5–5.1)
Sodium: 138 mmol/L (ref 135–145)
Total Bilirubin: 0.5 mg/dL (ref 0.3–1.2)
Total Protein: 5.7 g/dL — ABNORMAL LOW (ref 6.5–8.1)

## 2020-07-21 LAB — CBC WITH DIFFERENTIAL/PLATELET
Abs Immature Granulocytes: 0.05 10*3/uL (ref 0.00–0.07)
Basophils Absolute: 0.1 10*3/uL (ref 0.0–0.1)
Basophils Relative: 1 %
Eosinophils Absolute: 0.1 10*3/uL (ref 0.0–0.5)
Eosinophils Relative: 1 %
HCT: 29 % — ABNORMAL LOW (ref 36.0–46.0)
Hemoglobin: 8.7 g/dL — ABNORMAL LOW (ref 12.0–15.0)
Immature Granulocytes: 1 %
Lymphocytes Relative: 26 %
Lymphs Abs: 1.8 10*3/uL (ref 0.7–4.0)
MCH: 25.4 pg — ABNORMAL LOW (ref 26.0–34.0)
MCHC: 30 g/dL (ref 30.0–36.0)
MCV: 84.5 fL (ref 80.0–100.0)
Monocytes Absolute: 0.5 10*3/uL (ref 0.1–1.0)
Monocytes Relative: 7 %
Neutro Abs: 4.6 10*3/uL (ref 1.7–7.7)
Neutrophils Relative %: 64 %
Platelets: 418 10*3/uL — ABNORMAL HIGH (ref 150–400)
RBC: 3.43 MIL/uL — ABNORMAL LOW (ref 3.87–5.11)
RDW: 17.5 % — ABNORMAL HIGH (ref 11.5–15.5)
WBC: 7.1 10*3/uL (ref 4.0–10.5)
nRBC: 0 % (ref 0.0–0.2)

## 2020-07-21 LAB — GLUCOSE, CAPILLARY
Glucose-Capillary: 115 mg/dL — ABNORMAL HIGH (ref 70–99)
Glucose-Capillary: 150 mg/dL — ABNORMAL HIGH (ref 70–99)
Glucose-Capillary: 156 mg/dL — ABNORMAL HIGH (ref 70–99)
Glucose-Capillary: 56 mg/dL — ABNORMAL LOW (ref 70–99)
Glucose-Capillary: 149 mg/dL — ABNORMAL HIGH (ref 70–99)

## 2020-07-21 IMAGING — DX DG KNEE COMPLETE 4+V*R*
5 series · 6 of 6 positions shown · non-contrast
Comparison: None

CLINICAL DATA: RIGHT knee swelling

EXAM:
RIGHT KNEE - COMPLETE 4+ VIEW

[knee ap]
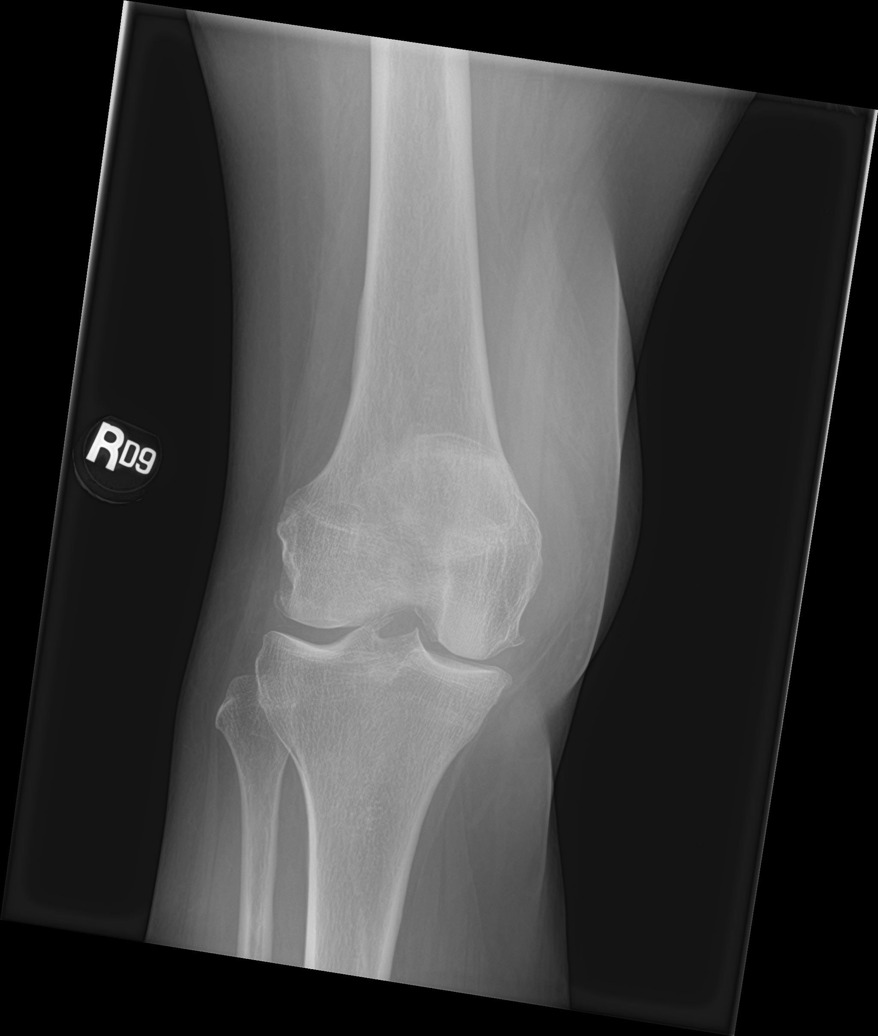

[knee lat]
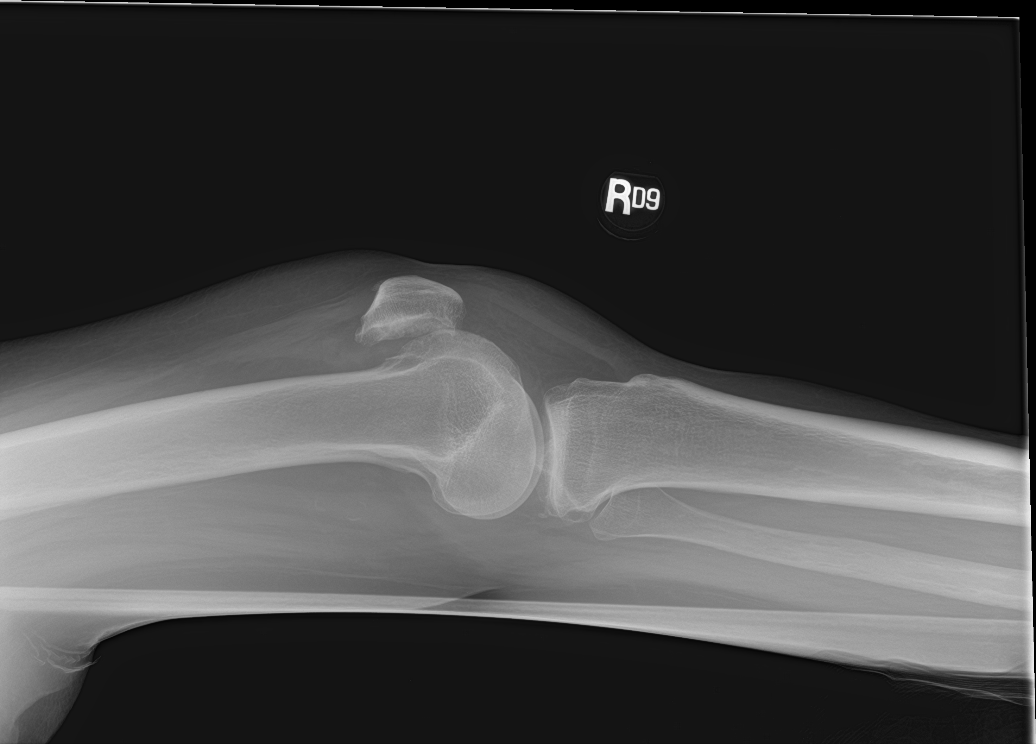

[Series 4: knee sunrise · 0.14mm/px · 2 of 2 slices shown]
[im 1/2]
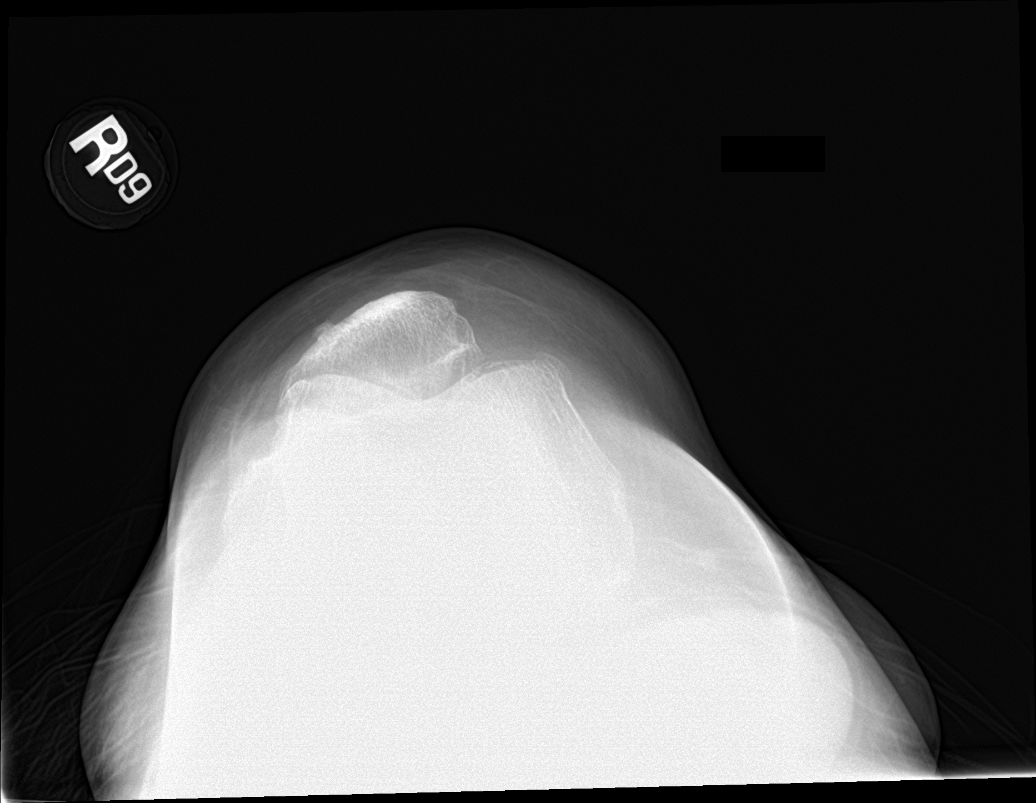
[im 2/2]
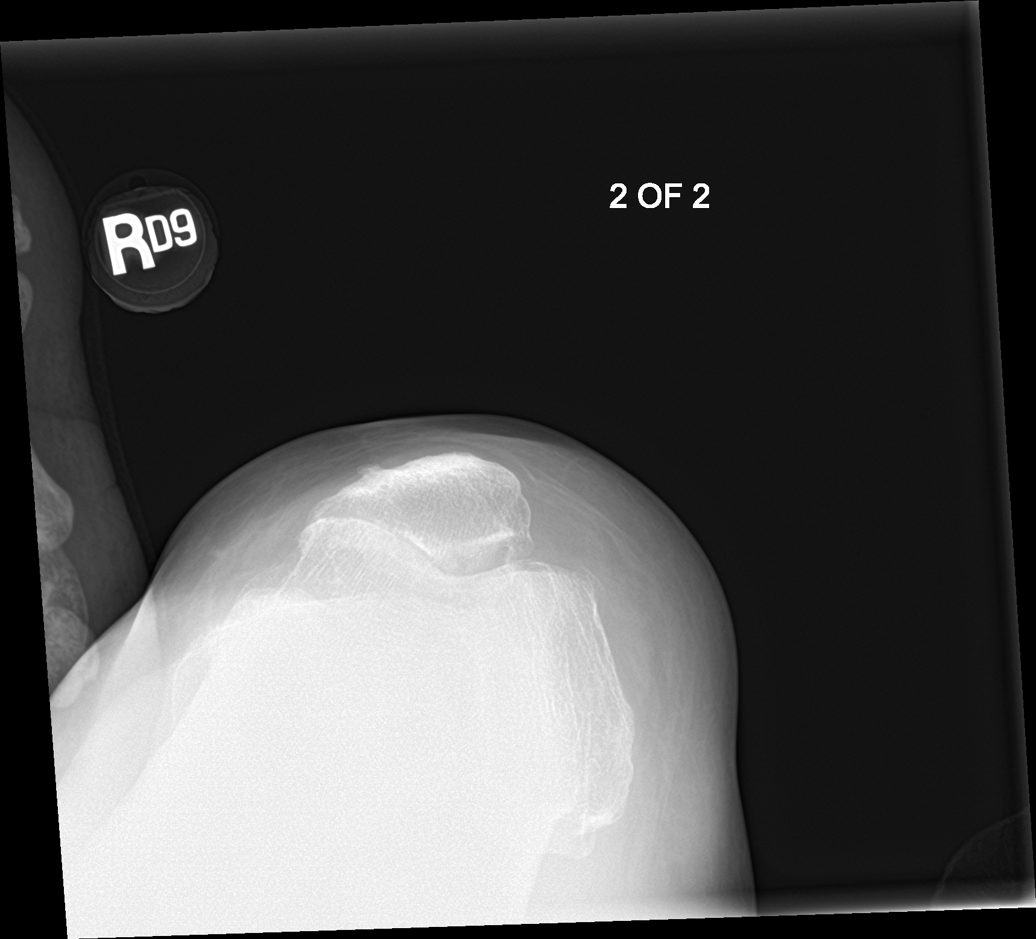

[knee obl (1 of 2)]
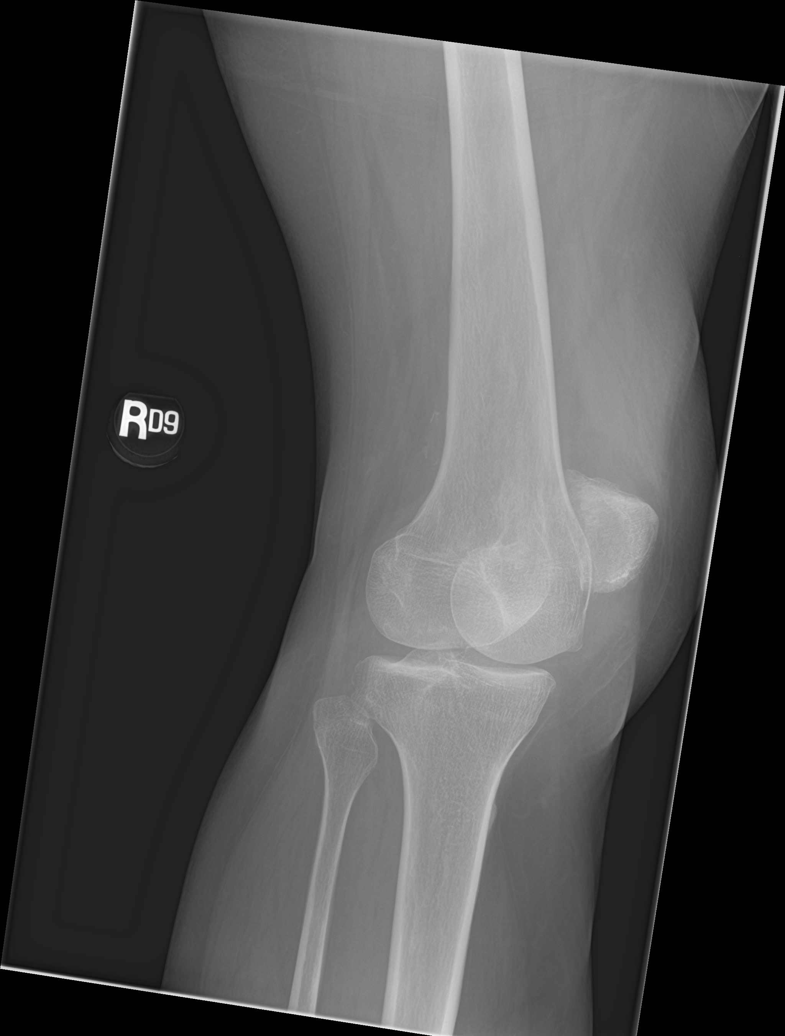

[knee obl (2 of 2)]
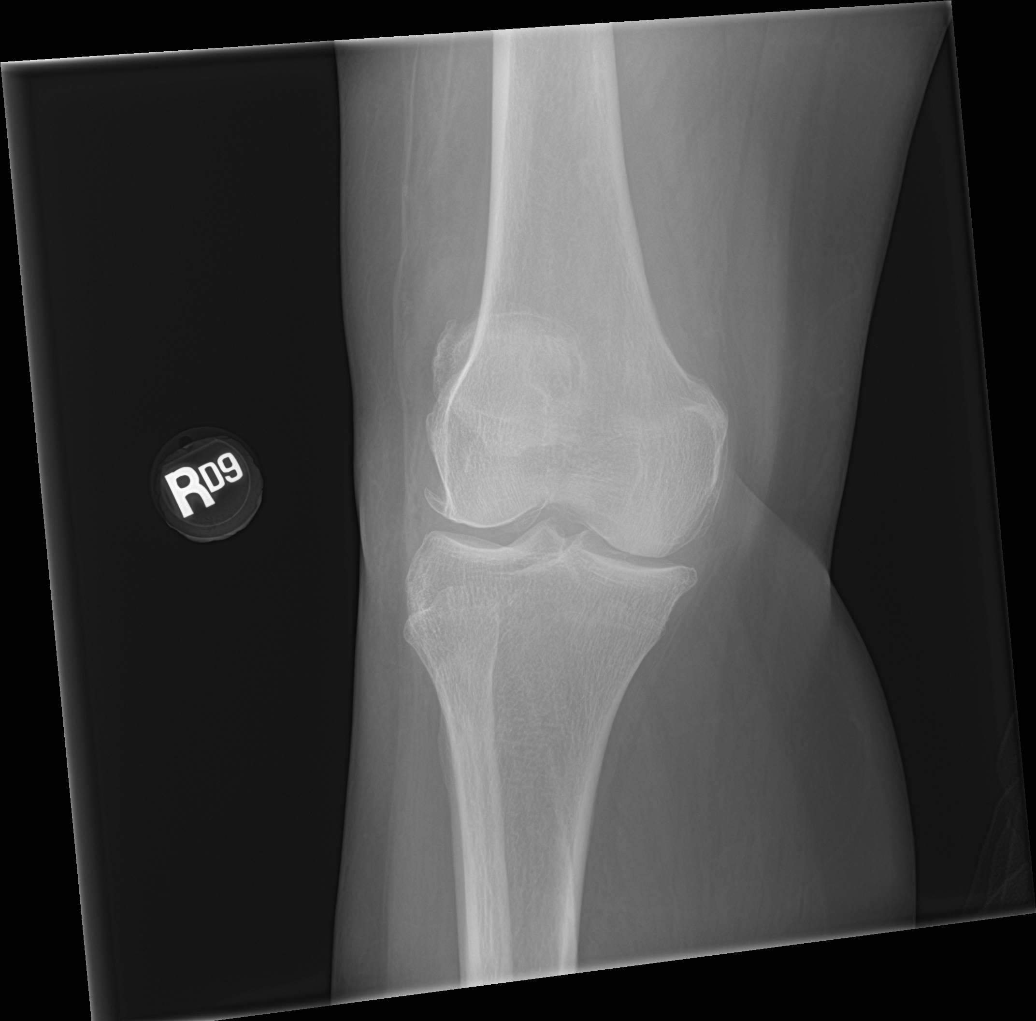

[6 of 6 positions shown; findings below may reference images not displayed]

FINDINGS: Osseous mineralization low normal.

Scattered joint space narrowing and spur formation in all 3
compartments.

No acute fracture, dislocation, or bone destruction.

Minimal joint effusion.
IMPRESSION: Degenerative changes and minimal joint effusion RIGHT knee.

No acute abnormalities.

## 2020-07-21 MED ORDER — DICLOFENAC SODIUM 1 % EX GEL
2.0000 g | Freq: Four times a day (QID) | CUTANEOUS | Status: DC
  Administered 2020-07-21 – 2020-07-31 (×32): 2 g via TOPICAL
  Filled 2020-07-21: qty 100

## 2020-07-21 NOTE — Significant Event (Signed)
Hypoglycemic Event  CBG: 56  Treatment: Pineapple juice (per patient request)   Symptoms: None noted   Follow-up CBG: Time:2156 CBG Result:149  Possible Reasons for Event: none   Comments/MD notified:     Celesta Aver

## 2020-07-21 NOTE — Progress Notes (Signed)
PROGRESS NOTE   Subjective/Complaints: Patient seen laying in bed this morning.  She states she did not sleep well overnight because she bumped her knees together and has had excruciating pain in her right knee since that time.  ROS: + Right knee pain.  Denies CP, SOB, N/V/D  Objective:   No results found. Recent Labs    07/21/20 0845  WBC 7.1  HGB 8.7*  HCT 29.0*  PLT 418*   Recent Labs    07/21/20 0845  NA 138  K 3.7  CL 105  CO2 25  GLUCOSE 151*  BUN 5*  CREATININE 0.71  CALCIUM 8.8*    Intake/Output Summary (Last 24 hours) at 07/21/2020 1222 Last data filed at 07/20/2020 1903 Gross per 24 hour  Intake 240 ml  Output --  Net 240 ml        Physical Exam: Vital Signs Blood pressure 133/75, pulse 88, temperature 98.8 F (37.1 C), temperature source Oral, resp. rate 18, height 6' (1.829 m), weight 99.7 kg, SpO2 100 %. Constitutional: No distress . Vital signs reviewed. HENT: Normocephalic.  Atraumatic. Eyes: EOMI. No discharge. Cardiovascular: No JVD.  RRR. Respiratory: Normal effort.  No stridor.  Bilateral clear to auscultation. GI: Non-distended.  BS +. Skin: Warm and dry.  Intact. Psych: Normal mood.  Normal behavior. Musc: Right knee with edema and extremely tender > medial and lateral joint lines. Neurologic: Alert Motor: Bilateral upper extremities: 4/5 proximal distal, grip 4-/5, unchanged Bilateral lower extremities: Hip flexion 4/5, distally 4+/5, right lower extremity limited due to pain. Bilateral upper extremity ataxia, some improvement  Assessment/Plan: 1. Functional deficits which require 3+ hours per day of interdisciplinary therapy in a comprehensive inpatient rehab setting.  Physiatrist is providing close team supervision and 24 hour management of active medical problems listed below.  Physiatrist and rehab team continue to assess barriers to discharge/monitor patient progress  toward functional and medical goals  Care Tool:  Bathing  Bathing activity did not occur: Refused Body parts bathed by patient: Right arm,Left arm,Chest,Abdomen,Front perineal area,Right upper leg,Left upper leg,Face   Body parts bathed by helper: Buttocks,Right lower leg,Left lower leg     Bathing assist Assist Level: Moderate Assistance - Patient 50 - 74%     Upper Body Dressing/Undressing Upper body dressing Upper body dressing/undressing activity did not occur (including orthotics): Refused What is the patient wearing?: Pull over shirt    Upper body assist Assist Level: Set up assist    Lower Body Dressing/Undressing Lower body dressing    Lower body dressing activity did not occur: Refused What is the patient wearing?: Pants     Lower body assist Assist for lower body dressing: Moderate Assistance - Patient 50 - 74%     Toileting Toileting Toileting Activity did not occur Landscape architect and hygiene only): Refused  Toileting assist Assist for toileting: 2 Helpers     Transfers Chair/bed transfer  Transfers assist     Chair/bed transfer assist level: Moderate Assistance - Patient 50 - 74%     Locomotion Ambulation   Ambulation assist      Assist level: 2 helpers Assistive device: Ethelene Hal Max distance: 22ft   Walk 10  feet activity   Assist  Walk 10 feet activity did not occur: Safety/medical concerns        Walk 50 feet activity   Assist Walk 50 feet with 2 turns activity did not occur: Safety/medical concerns         Walk 150 feet activity   Assist Walk 150 feet activity did not occur: Safety/medical concerns         Walk 10 feet on uneven surface  activity   Assist Walk 10 feet on uneven surfaces activity did not occur: Safety/medical concerns         Wheelchair     Assist Will patient use wheelchair at discharge?: Yes Type of Wheelchair: Power Wheelchair activity did not occur: Safety/medical  concerns  Wheelchair assist level: Supervision/Verbal cueing Max wheelchair distance: 30'    Wheelchair 50 feet with 2 turns activity    Assist    Wheelchair 50 feet with 2 turns activity did not occur: Safety/medical concerns   Assist Level: Supervision/Verbal cueing   Wheelchair 150 feet activity     Assist  Wheelchair 150 feet activity did not occur: Safety/medical concerns       Medical Problem List and Plan: 1.  Decreased functional ability secondary to acute encephalopathy.  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    Continue [name] 2.  Antithrombotics: -DVT/anticoagulation: Continue Lovenox 52.5 mg daily.              -antiplatelet therapy: N/A 3. Pain Management: Oxycodone as needed  Robaxin 500 3 times daily scheduled on 3/12, continue  Right knee extremely tender to touch on 3/28  See #23 4. Mood: Team support             -antipsychotic agents: N/A 5. Neuropsych: This patient is not capable of making decisions on her own behalf.  Requires consistent positive reinforcement 6. Skin/Wound Care: Routine skin checks 7. Fluids/Electrolytes/Nutrition: Routine in and outs 8.  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  Continue IV Rocephin through 07/23/2020 and follow-up infectious disease. 9.  Diabetes mellitus with hyperglycemia/DKA.  Hemoglobin A1c 8.0.  Check blood sugars before meals and at bedtime  CBG (last 3)  Recent Labs    07/20/20 2106 07/21/20 0629 07/21/20 1203  GLUCAP 166* 115* 150*   Lantus insulin 45 units daily, decreased to 40 on 3/16, decreased to 35 on 3/22  Decreased Novolog to 3U TID  Increased Metformin to 500mg  BID.   Slightly elevated on 3/28 10.  CLL.  Follow-up outpatient Dr. Delight Hoh 11.  Hypothyroidism.  Synthroid 12.  Anemia of chronic disease.  Multifactorial.   Hemoglobin 8.7 on 3/28  Continue to monitor 13.  Hypertension.  Dilacor 120 mg daily,Micardis 80-25 mg daily           Vitals:   07/20/20 2200 07/21/20 0400  BP:  133/75  Pulse:  88  Resp:  18  Temp: 99.6 F (37.6 C) 98.8 F (37.1 C)  SpO2:  100%   Controlled on 3/28 14.  Transaminitis.    Resolved on 3/28. 15.  Obesity.  BMI 31.19.  Dietary follow-up 16.  History of asthma.  Continue Singulair 10 mg nightly.   17.  History of genital herpes.  Conitnue Valtrex 18.  Sleep disturbance  Melatonin  ECG reviewed, trazodone 25 nightly started on 3/12  Improving  overall 19.  Slow transit constipation  Bowel meds increased on 3/9, increased again on 3/11, decreased on 3/21 to optimize bowel function  Improving 20.  Hypokalemia  Potassium 3.7 on 3/28  Supplemented x2 days on 3/21 21.  Urinary retention  Flomax increased on 3/11  Continue I/O caths as necessary  Emptying now without significant residual 22. Tachycardia:   Continue Cardizem  Controlled on 3/28 23.  Right knee pain  Extremely tender to touch > along medial lateral joint lines  X-ray ordered  Voltaren gel ordered  LOS: 24 days A FACE TO FACE EVALUATION WAS PERFORMED  Tonya Myers Lorie Phenix 07/21/2020, 12:22 PM

## 2020-07-21 NOTE — Progress Notes (Signed)
Speech Language Pathology Daily Session Note  Patient Details  Name: Tonya Myers MRN: [id number] Date of Birth: 1965-12-26  Today's Date: 07/21/2020 SLP Individual Time: 1105-1200 SLP Individual Time Calculation (min): 55 min  Short Term Goals: Week 3: SLP Short Term Goal 1 (Week 3): STG=LTG's (ELOS: 3/31)  Skilled Therapeutic Interventions:Skilled ST services focused on cognitive skills. SLP facilitated complex problem solving, error awareness and recall within task with scheduling and novel deductive reasoning task. Pt required supervision A fade to mod I. SLP instrcuted pt in recall strategies of associations and categorizations, in novel card task, pt required min A verbal cues to recall 5-10 items in a category and mod I to recall associations.Pt was left in room with call bell within reach and bed alarm set. SLP recommends to continue skilled services.     Pain Pain Assessment Pain Scale: 0-10 Pain Score: 0-No pain Pain Location: Knee Pain Orientation: Right  Therapy/Group: Individual Therapy  [name]  [facility] 07/21/2020, 12:15 PM

## 2020-07-21 NOTE — Progress Notes (Signed)
Patient not sure exactly what she did but is having pain in her right knee. Will alert oncoming shift. Patient resting in bed with call bell within reach.

## 2020-07-21 NOTE — Progress Notes (Signed)
Occupational Therapy Session Note  Patient Details  Name: Marla Pouliot MRN: [id number] Date of Birth: April 04, 1966  Today's Date: 07/21/2020 OT Individual Time: 0900-0930 OT Individual Time Calculation (min): 30 min    Short Term Goals: Week 3:  OT Short Term Goal 1 (Week 3): patient will complete sit pivot transfers min A of one OT Short Term Goal 2 (Week 3): patient will complete side lying to/from sitting with min A of one OT Short Term Goal 3 (Week 3): patient will complete toileting max A of one OT Short Term Goal 4 (Week 3): patient will complete LB bathing and dressing mod A with ADs  Skilled Therapeutic Interventions/Progress Updates:    Patient in bed, right knee tender to touch and limited AROM/PROM - she states that she is awaiting xray.  Ice pack provided and tolerance for movement improved during session.  She is able to donn pants bed level with mod/max A due to right knee.  Clothing management via rolling in bed mod A.  Completed trunk mobility/posture and core exercises bed level with good carryover of back precautions, UB conditioning and strengthening activities - she is able to lift both arms OH against gravity this session without back support.  Reviewed DME and goals with patient and husband.  She remained in bed at close of session, bed alarm set and call bell in hand.    Therapy Documentation Precautions:  Precautions Precautions: Fall,Back Restrictions Weight Bearing Restrictions: No Other Position/Activity Restrictions: spinal  Therapy/Group: Individual Therapy  Carlos Levering 07/21/2020, 7:31 AM

## 2020-07-21 NOTE — Progress Notes (Signed)
Physical Therapy Weekly Progress Note  Patient Details  Name: Tonya Myers MRN: [id number] Date of Birth: 07/04/1965  Beginning of progress report period: July 14, 2020 End of progress report period: July 21, 2020  Today's Date: 07/21/2020 PT Individual Time: 1500-1600 PT Individual Time Calculation (min): 60 min   Patient has met 2 of 5 short term goals.  Patient partly met goal for +1 for transfer, but not squat pivot due to continued LE weakness, but for slide board transfers and her spouse initiating training to work on transfers with her as well this week.  She was not able to propel 100' with w/c but 51' due to UE fatigue and after discussion with seating specialist have decided to use a power chair at d/c.  She was able to stand with +2 mod A from elevated mat and to stand for 1 minute 10 seconds max.  She has been participating some verbally for w/c set up for transfers, but not able to perform physically.  She will continue to progress and have started discussion about potentially extending stay on rehab due to continued improvements in UE strength this date and for more min A level prior to d/c home.   Patient continues to demonstrate the following deficits muscle weakness and impaired timing and sequencing, ataxia and decreased coordination and therefore will continue to benefit from skilled PT intervention to increase functional independence with mobility.  Patient progressing toward long term goals..  Continue plan of care.  Long term goals were downgraded last week due to lack of progress with LE strength.   PT Short Term Goals Week 3:  PT Short Term Goal 1 (Week 3): Patient to perform squat pivot transfers consistently with +1 A. PT Short Term Goal 1 - Progress (Week 3): Partly met PT Short Term Goal 2 (Week 3): Patient to perform sit to stand from elevated mat with +2 mod A PT Short Term Goal 2 - Progress (Week 3): Met PT Short Term Goal 3 (Week 3): Patient to propel w/c x  100' with S. PT Short Term Goal 3 - Progress (Week 3): Progressing toward goal PT Short Term Goal 4 (Week 3): Patient able to participate in w/c set up for transfer with min cues. PT Short Term Goal 4 - Progress (Week 3): Progressing toward goal PT Short Term Goal 5 (Week 3): Patient to tolerate standing in device x 75 seconds to allow for standing ADL's. PT Short Term Goal 5 - Progress (Week 3): Met Week 4:  PT Short Term Goal 1 (Week 4): STG=LTG due to ELOS  Skilled Therapeutic Interventions/Progress Updates:  Patient in supine just returning from x-ray due to new R knee pain.  Reports rolled over and hit it with the other knee, then used footboard when trying to push up to reposition in bed and felt pain.  Noted some edema and wrapped with ace bandage, but educated to have it removed prior to end of day.  Patient noted to be soiled with urine in the bed following using bed pan.  Performed multiple rolls with mod A for changing brief and pants at bed level with max A.  Patient side to sit with mod A from flat bed.  Performed slide board transfer to w/c with mod A.  Patient in w/c propelled x 50' with S and increased time prior to fatigue.  Pushed in w/c to ortho gym.  Performed 6' forward and 6' in reverse on UBE.  Patient assisted in w/c to room.  Performed slide board transfer to L back to bed with mod A.  Sit to supine mod A for both LE's due to pain.  Left with call bell in reach, spouse and aunt in the room and bed alarm set.   Ambulation/gait training;Community reintegration;DME/adaptive equipment instruction;Neuromuscular re-education;Psychosocial support;Stair training;UE/LE Strength taining/ROM;Wheelchair propulsion/positioning;Balance/vestibular training;Discharge planning;Pain management;Therapeutic Activities;UE/LE Coordination activities;Cognitive remediation/compensation;Functional mobility training;Patient/family education;Therapeutic Exercise;Visual/perceptual remediation/compensation    Therapy Documentation Precautions:  Precautions Precautions: Fall,Back Restrictions Weight Bearing Restrictions: No Other Position/Activity Restrictions: spinal Pain: Pain Assessment Faces Pain Scale: Hurts whole lot Pain Type: Acute pain Pain Location: Knee Pain Orientation: Right Pain Descriptors / Indicators: Sore;Tender Pain Onset: On-going Pain Intervention(s): Ambulation/increased activity;Repositioned;Other (Comment) (ace wrap)   Therapy/Group: Individual Therapy  Reginia Naas  [place], [place] 07/21/2020, 5:42 PM

## 2020-07-22 ENCOUNTER — Inpatient Hospital Stay (HOSPITAL_COMMUNITY): Payer: Medicare Other

## 2020-07-22 DIAGNOSIS — G061 Intraspinal abscess and granuloma: Secondary | ICD-10-CM | POA: Diagnosis not present

## 2020-07-22 DIAGNOSIS — G9340 Encephalopathy, unspecified: Secondary | ICD-10-CM | POA: Diagnosis not present

## 2020-07-22 DIAGNOSIS — G8929 Other chronic pain: Secondary | ICD-10-CM

## 2020-07-22 DIAGNOSIS — D638 Anemia in other chronic diseases classified elsewhere: Secondary | ICD-10-CM | POA: Diagnosis not present

## 2020-07-22 DIAGNOSIS — M25561 Pain in right knee: Secondary | ICD-10-CM

## 2020-07-22 DIAGNOSIS — I10 Essential (primary) hypertension: Secondary | ICD-10-CM | POA: Diagnosis not present

## 2020-07-22 LAB — CBC
HCT: 31.6 % — ABNORMAL LOW (ref 36.0–46.0)
Hemoglobin: 9.3 g/dL — ABNORMAL LOW (ref 12.0–15.0)
MCH: 24.9 pg — ABNORMAL LOW (ref 26.0–34.0)
MCHC: 29.4 g/dL — ABNORMAL LOW (ref 30.0–36.0)
MCV: 84.7 fL (ref 80.0–100.0)
Platelets: 456 10*3/uL — ABNORMAL HIGH (ref 150–400)
RBC: 3.73 MIL/uL — ABNORMAL LOW (ref 3.87–5.11)
RDW: 17.5 % — ABNORMAL HIGH (ref 11.5–15.5)
WBC: 8.1 10*3/uL (ref 4.0–10.5)
nRBC: 0 % (ref 0.0–0.2)

## 2020-07-22 LAB — GLUCOSE, CAPILLARY
Glucose-Capillary: 130 mg/dL — ABNORMAL HIGH (ref 70–99)
Glucose-Capillary: 116 mg/dL — ABNORMAL HIGH (ref 70–99)
Glucose-Capillary: 183 mg/dL — ABNORMAL HIGH (ref 70–99)
Glucose-Capillary: 163 mg/dL — ABNORMAL HIGH (ref 70–99)

## 2020-07-22 LAB — URIC ACID: Uric Acid, Serum: 2.5 mg/dL (ref 2.5–7.1)

## 2020-07-22 IMAGING — DX DG KNEE 1-2V*R*
2 series · 2 of 2 positions shown · non-contrast
Comparison: [DATE]

CLINICAL DATA: Right knee pain and swelling, initial encounter

EXAM:
RIGHT KNEE - 2 VIEW

[knee ap]
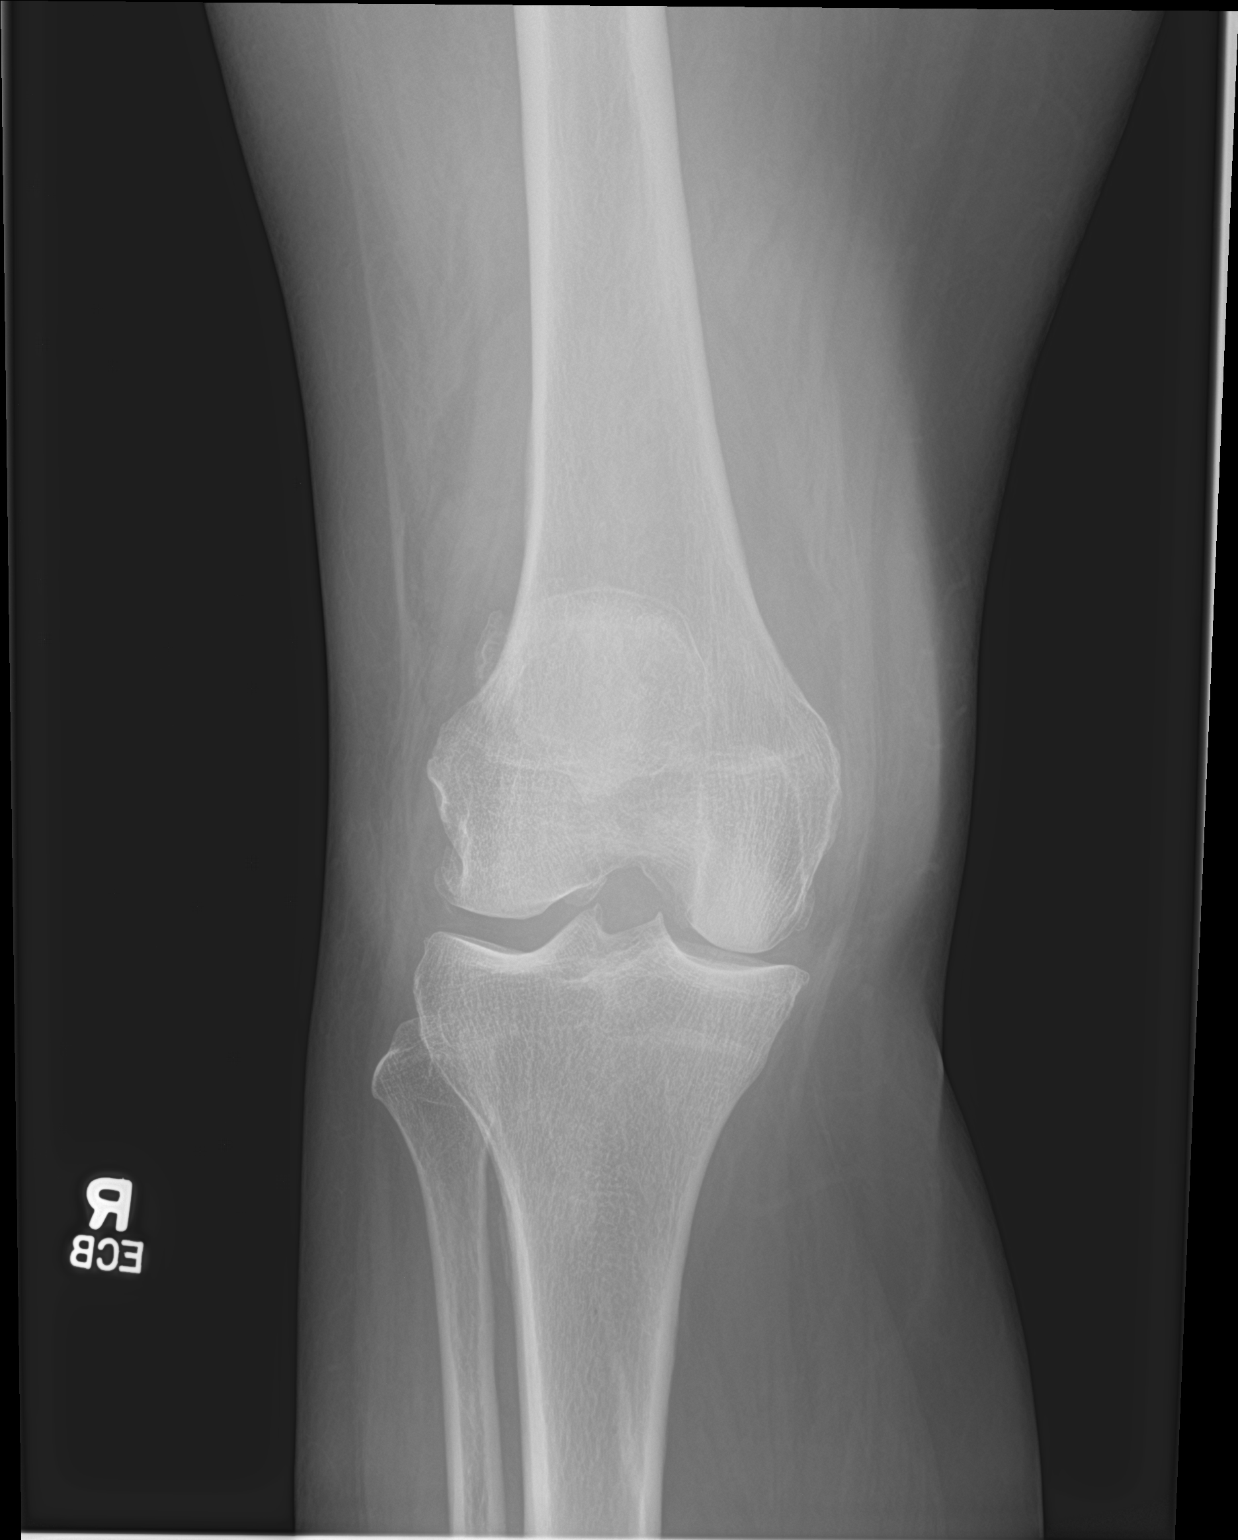

[knee lat]
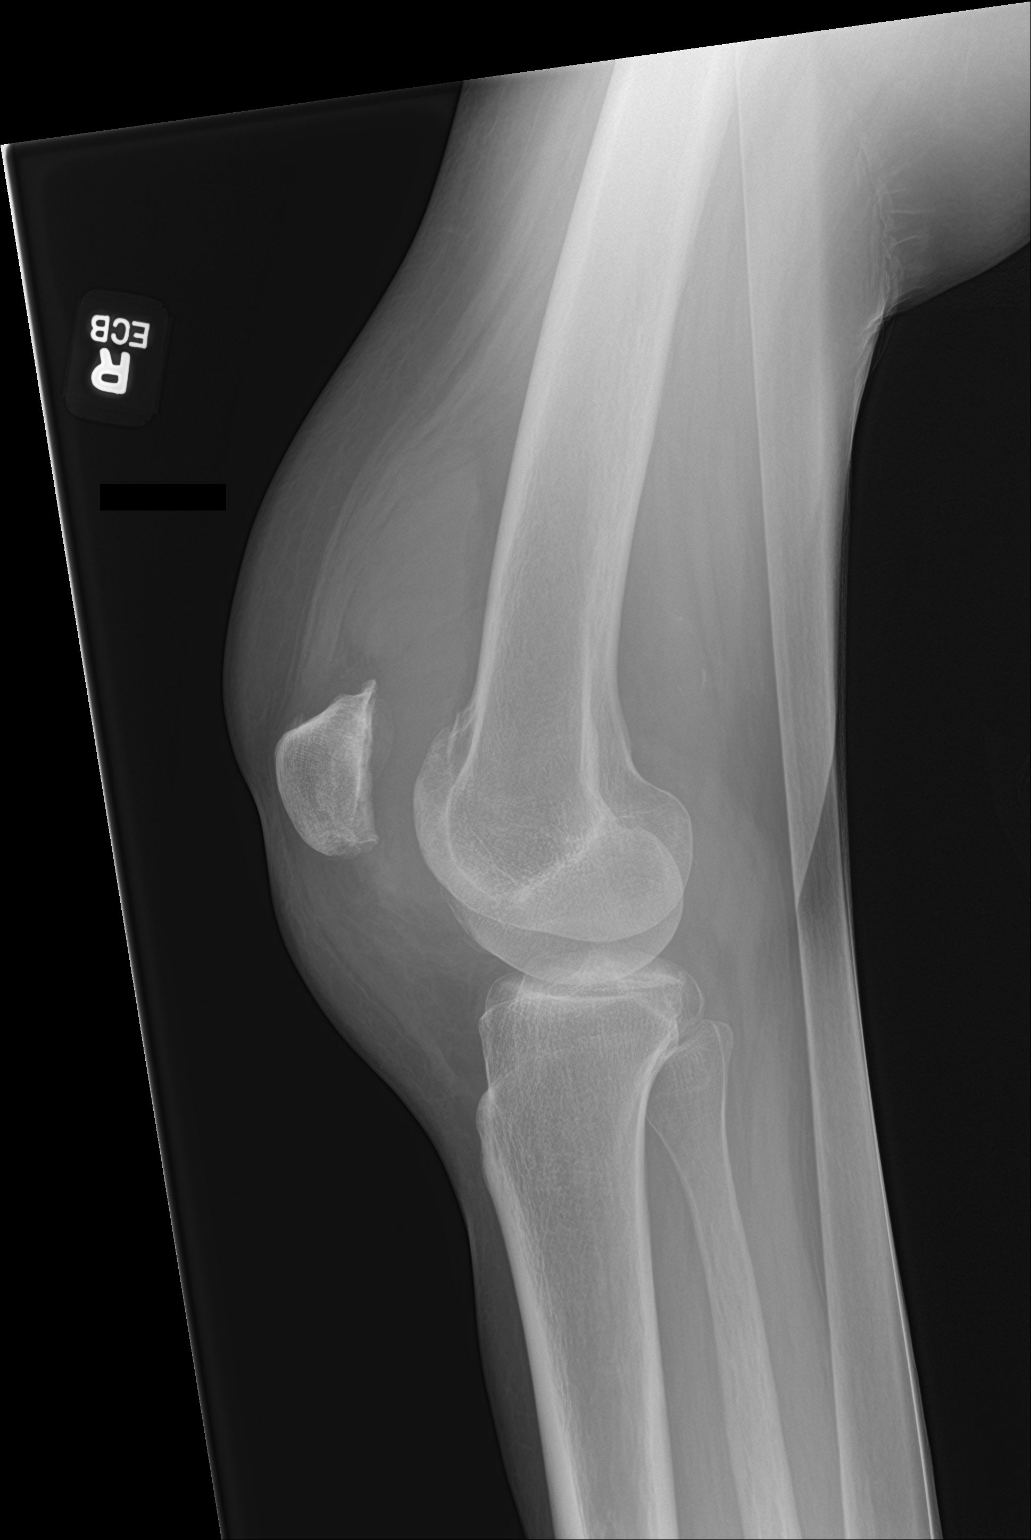

[2 of 2 positions shown; findings below may reference images not displayed]

FINDINGS: Degenerative changes are noted in the medial joint space and
patellofemoral space. Significant increase in joint effusion is
noted. No definitive fracture is seen.
IMPRESSION: Degenerative change with large joint effusion.

## 2020-07-22 MED ORDER — COLCHICINE 0.6 MG PO TABS
0.6000 mg | ORAL_TABLET | Freq: Every day | ORAL | Status: DC
Start: 2020-07-22 — End: 2020-07-31
  Administered 2020-07-22 – 2020-07-31 (×10): 0.6 mg via ORAL
  Filled 2020-07-22 (×10): qty 1

## 2020-07-22 MED ORDER — COLCHICINE 0.6 MG PO TABS
1.2000 mg | ORAL_TABLET | Freq: Once | ORAL | Status: AC
  Administered 2020-07-22: 1.2 mg via ORAL
  Filled 2020-07-22: qty 2

## 2020-07-22 NOTE — Plan of Care (Signed)
  Problem: Clinical Measurements: Goal: Will remain free from infection Outcome: Progressing   Problem: Activity: Goal: Risk for activity intolerance will decrease Outcome: Progressing   Problem: Coping: Goal: Level of anxiety will decrease Outcome: Progressing   Problem: Elimination: Goal: Will not experience complications related to bowel motility Outcome: Progressing Goal: Will not experience complications related to urinary retention Outcome: Progressing   Problem: Pain Managment: Goal: General experience of comfort will improve Outcome: Progressing   Problem: Safety: Goal: Ability to remain free from injury will improve Outcome: Progressing   Problem: Skin Integrity: Goal: Risk for impaired skin integrity will decrease Outcome: Progressing   Problem: Consults Goal: RH GENERAL PATIENT EDUCATION Description: See Patient Education module for education specifics. Outcome: Progressing   Problem: RH BOWEL ELIMINATION Goal: RH STG MANAGE BOWEL WITH ASSISTANCE Description: STG Manage Bowel with min Assistance. Outcome: Progressing   Problem: RH BLADDER ELIMINATION Goal: RH STG MANAGE BLADDER WITH ASSISTANCE Description: STG Manage Bladder With min Assistance Outcome: Progressing   Problem: RH SKIN INTEGRITY Goal: RH STG SKIN FREE OF INFECTION/BREAKDOWN Description: No new skin breakdown this admission  Outcome: Progressing Goal: RH STG MAINTAIN SKIN INTEGRITY WITH ASSISTANCE Description: STG Maintain Skin Integrity With min Assistance. Outcome: Progressing Goal: RH STG ABLE TO PERFORM INCISION/WOUND CARE W/ASSISTANCE Description: STG Able To Perform Incision/Wound Care With max Assistance. Outcome: Progressing   Problem: RH SAFETY Goal: RH STG ADHERE TO SAFETY PRECAUTIONS W/ASSISTANCE/DEVICE Description: STG Adhere to Safety Precautions With min Assistance/Device. Outcome: Progressing   Problem: RH PAIN MANAGEMENT Goal: RH STG PAIN MANAGED AT OR BELOW PT'S  PAIN GOAL Description: Less than 3 out of 10 Outcome: Progressing   Problem: RH KNOWLEDGE DEFICIT GENERAL Goal: RH STG INCREASE KNOWLEDGE OF SELF CARE AFTER HOSPITALIZATION Description: Min assist Outcome: Progressing

## 2020-07-22 NOTE — Progress Notes (Signed)
PROGRESS NOTE   Subjective/Complaints: Patient seen sitting up in bed this morning.  Significant other and aunt at bedside.  Patient states she slept fairly overnight.  She continues to note exquisite right knee pain.  Upon further questioning, patient states that she has a history of right knee pain and decided to retire as a result.  She notes that she had a "jelly" injection previously.  Aunt with request for me to call family member/physician in [place] to review care.  Imaging reviewed with patient.  ROS: + Right knee pain, unchanged.  Denies CP, SOB, N/V/D  Objective:   DG Knee 4 Views W/Patella Right  Result Date: 07/21/2020 CLINICAL DATA:  RIGHT knee swelling EXAM: RIGHT KNEE - COMPLETE 4+ VIEW COMPARISON:  None FINDINGS: Osseous mineralization low normal. Scattered joint space narrowing and spur formation in all 3 compartments. No acute fracture, dislocation, or bone destruction. Minimal joint effusion. IMPRESSION: Degenerative changes and minimal joint effusion RIGHT knee. No acute abnormalities. Electronically Signed   By: Lavonia Dana M.D.   On: 07/21/2020 16:46   Recent Labs    07/21/20 0845  WBC 7.1  HGB 8.7*  HCT 29.0*  PLT 418*   Recent Labs    07/21/20 0845  NA 138  K 3.7  CL 105  CO2 25  GLUCOSE 151*  BUN 5*  CREATININE 0.71  CALCIUM 8.8*    Intake/Output Summary (Last 24 hours) at 07/22/2020 0910 Last data filed at 07/22/2020 0841 Gross per 24 hour  Intake 240 ml  Output --  Net 240 ml        Physical Exam: Vital Signs Blood pressure 126/89, pulse (!) 101, temperature 99.4 F (37.4 C), temperature source Oral, resp. rate 18, height 6' (1.829 m), weight 99.7 kg, SpO2 100 %. Constitutional: No distress . Vital signs reviewed. HENT: Normocephalic.  Atraumatic. Eyes: EOMI. No discharge. Cardiovascular: No JVD.  RRR. Respiratory: Normal effort.  No stridor.  Bilateral clear to  auscultation. GI: Non-distended.  BS +. Skin: Warm and dry.  Intact. Psych: Normal mood.  Normal behavior. Musc: Right knee with edema and extreme tenderness and warmth. Neurologic: Alert Motor: Bilateral upper extremities: 4/5 proximal distal, grip 4-/5, unchanged Bilateral lower extremities: Hip flexion 4/5, distally 4+/5, right lower extremity limited due to pain, unchanged. Bilateral upper extremity ataxia, some improvement  Assessment/Plan: 1. Functional deficits which require 3+ hours per day of interdisciplinary therapy in a comprehensive inpatient rehab setting.  Physiatrist is providing close team supervision and 24 hour management of active medical problems listed below.  Physiatrist and rehab team continue to assess barriers to discharge/monitor patient progress toward functional and medical goals  Care Tool:  Bathing  Bathing activity did not occur: Refused Body parts bathed by patient: Right arm,Left arm,Chest,Abdomen,Front perineal area,Right upper leg,Left upper leg,Face   Body parts bathed by helper: Buttocks,Right lower leg,Left lower leg     Bathing assist Assist Level: Moderate Assistance - Patient 50 - 74%     Upper Body Dressing/Undressing Upper body dressing Upper body dressing/undressing activity did not occur (including orthotics): Refused What is the patient wearing?: Pull over shirt    Upper body assist Assist Level: Set up  assist    Lower Body Dressing/Undressing Lower body dressing    Lower body dressing activity did not occur: Refused What is the patient wearing?: Pants     Lower body assist Assist for lower body dressing: Moderate Assistance - Patient 50 - 74%     Toileting Toileting Toileting Activity did not occur Landscape architect and hygiene only): Refused  Toileting assist Assist for toileting: 2 Helpers     Transfers Chair/bed transfer  Transfers assist     Chair/bed transfer assist level: Moderate Assistance - Patient 50  - 74%     Locomotion Ambulation   Ambulation assist      Assist level: 2 helpers Assistive device: Ethelene Hal Max distance: 66ft   Walk 10 feet activity   Assist  Walk 10 feet activity did not occur: Safety/medical concerns        Walk 50 feet activity   Assist Walk 50 feet with 2 turns activity did not occur: Safety/medical concerns         Walk 150 feet activity   Assist Walk 150 feet activity did not occur: Safety/medical concerns         Walk 10 feet on uneven surface  activity   Assist Walk 10 feet on uneven surfaces activity did not occur: Safety/medical concerns         Wheelchair     Assist Will patient use wheelchair at discharge?: Yes Type of Wheelchair: Power Wheelchair activity did not occur: Safety/medical concerns  Wheelchair assist level: Supervision/Verbal cueing Max wheelchair distance: 50'    Wheelchair 50 feet with 2 turns activity    Assist    Wheelchair 50 feet with 2 turns activity did not occur: Safety/medical concerns   Assist Level: Supervision/Verbal cueing   Wheelchair 150 feet activity     Assist  Wheelchair 150 feet activity did not occur: Safety/medical concerns       Medical Problem List and Plan: 1.  Decreased functional ability secondary to acute encephalopathy.  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    Continue [name] 2.  Antithrombotics: -DVT/anticoagulation: Continue Lovenox 52.5 mg daily.              -antiplatelet therapy: N/A 3. Pain Management: Oxycodone as needed  Robaxin 500 3 times daily scheduled on 3/12, continue  Right knee extremely tender to touch on 3/28  See #23 4. Mood: Team support             -antipsychotic agents: N/A 5. Neuropsych: This patient is  not capable of making decisions on her own behalf.  Requires consistent positive reinforcement 6. Skin/Wound Care: Routine skin checks 7. Fluids/Electrolytes/Nutrition: Routine in and outs 8.  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  Continue IV Rocephin through 07/23/2020 and follow-up infectious disease.  Repeat blood cultures ordered 9.  Diabetes mellitus with hyperglycemia/DKA.  Hemoglobin A1c 8.0.  Check blood sugars before meals and at bedtime  CBG (last 3)  Recent Labs    07/21/20 2118 07/21/20 2156 07/22/20 0625  GLUCAP 56* 149* 130*   Lantus insulin 45 units daily, decreased to 40 on 3/16, decreased to 35 on 3/22  Decreased Novolog to 3U TID, DC'd on 3/29  Increased Metformin to 500mg  BID.   Labile with hypoglycemia on 3/29 10.  CLL.  Follow-up outpatient Dr. Delight Hoh 11.  Hypothyroidism.  Synthroid  12.  Anemia of chronic disease.  Multifactorial.   Hemoglobin 8.7 on 3/28, repeat labs ordered  Continue to monitor 13.  Hypertension.  Dilacor 120 mg daily,Micardis 80-25 mg daily           Vitals:   07/21/20 2020 07/22/20 0536  BP: (!) 157/93 126/89  Pulse: (!) 103 (!) 101  Resp: 18 18  Temp: 99.6 F (37.6 C) 99.4 F (37.4 C)  SpO2: 100% 100%   Relatively controlled on 3/29 14.  Transaminitis: Resolved.    Resolved on 3/28. 15.  Obesity.  BMI 31.19.  Dietary follow-up 16.  History of asthma.  Continue Singulair 10 mg nightly.   17.  History of genital herpes.  Conitnue Valtrex 18.  Sleep disturbance  Melatonin  ECG reviewed, trazodone 25 nightly started on 3/12  Improving overall 19.  Slow transit constipation  Bowel meds increased on 3/9, increased again on 3/11, decreased on 3/21 to optimize bowel function  Improving 20.  Hypokalemia  Potassium 3.7 on 3/28  Supplemented x2 days on 3/21 21.  Urinary retention  Flomax increased on 3/11  Continue I/O caths as necessary  Emptying now without significant residual 22.  Tachycardia:   Continue Cardizem  Borderline on 3/29 23.  Right knee pain-gout versus septic arthritis with history of chronic knee pain  Extremely tender to touch > along medial lateral joint lines  X-ray personally reviewed, and with patient, degenerative changes with some effusion  Voltaren gel ordered with some benefit  Uric acid ordered  Blood cultures ordered  May consider joint aspiration with fluid analysis if no improvement by tomorrow  Colchicine started on 3/29, will avoid steroids at present unless clear diagnosis of gout  > 35 minutes spent in total with patient, family, and remote family member in reviewing chronic history, knee pain, and treatment plans    LOS: 25 days A FACE TO FACE EVALUATION WAS PERFORMED  Tonya Myers 07/22/2020, 9:10 AM

## 2020-07-22 NOTE — Progress Notes (Signed)
Physical Therapy Session Note  Patient Details  Name: Tonya Myers MRN: [id number] Date of Birth: Aug 26, 1965  Today's Date: 07/22/2020 PT Individual Time: 1500-1600 PT Individual Time Calculation (min): 60 min   Short Term Goals: Week 4:  PT Short Term Goal 1 (Week 4): STG=LTG due to ELOS  Skilled Therapeutic Interventions/Progress Updates:    Patient in supine and reports knee is still sore.  Wrapped with ace bandage for support and edema control.  Patient supine to side with cues and S, Side to sit with min to mod A for trunk and R knee support.  Patient transferred to L to power w/c with mod A using sliding board.  Patient in w/c unable to tolerate R foot on foot plate due to pain in knee so elevated on several pillows and strapped with gait belt.  Assisted to hallway and pt able to propel with S in wide open hallway practicing with increasing to second level speed while controlling direction, etc.  Patient in dayroom able to perform full turn with S.  Attempted around table in small space, but pt still in second speed mode and ran into equipment cart hitting R knee as well.  C/o severe pain and noted swelling as visualized removing ace wrap.  Replaced wrap to control swelling and pt eager to attempt more in wide open spaces.  Patient propelled x 250' and then returned to room turning into room on her own.  PT propelled to set up to transfer to bed.  Patient assisted with slide board to bed with +2 A for safety due to R knee pain.  Sit to supine +2 A and elevated leg and applied ice, but pt wanting to try Voltaren.  Applied and RN made aware and left open with elevation.  Aunt in the room, bed alarm active and call bell in reach.   Therapy Documentation Precautions:  Precautions Precautions: Fall,Back Restrictions Weight Bearing Restrictions: No Other Position/Activity Restrictions: spinal Pain: Pain Assessment Pain Scale: 0-10 Pain Score: 10-Worst pain ever Faces Pain Scale: Hurts  whole lot Pain Type: Acute pain Pain Location: Knee Pain Orientation: Right Pain Descriptors / Indicators: Sharp;Sore Pain Frequency: Constant Pain Onset: On-going Pain Intervention(s): Repositioned;Cold applied;RN made aware;Cutaneous stimulation   Therapy/Group: Individual Therapy  Reginia Naas  Magda Kiel, PT 07/22/2020, 4:33 PM

## 2020-07-22 NOTE — Progress Notes (Signed)
Occupational Therapy Weekly Progress Note  Patient Details  Name: Tonya Myers MRN: [id number] Date of Birth: 1966-02-14  Beginning of progress report period: July 14, 2020 End of progress report period: July 22, 2020     Patient has met 0 of 4 short term goals.  Patient had reached these short term goals at the end of last week but with recent set back due to right knee pain limiting adl and mobility in the past 2 days - anticipate that she will resume positive course as edema and pain resolve.  She continues to improve in areas of trunk control, seated balance and upper body strength and coordination.    Patient continues to demonstrate the following deficits: muscle weakness, decreased cardiorespiratoy endurance, impaired timing and sequencing, unbalanced muscle activation, decreased coordination and decreased motor planning and decreased sitting balance, decreased standing balance and decreased postural control and therefore will continue to benefit from skilled OT intervention to enhance overall performance with BADL, iADL and Reduce care partner burden.  Patient progressing toward long term goals..  Continue plan of care.  OT Short Term Goals Week 3:  OT Short Term Goal 1 (Week 3): patient will complete sit pivot transfers min A of one OT Short Term Goal 1 - Progress (Week 3): Partly met OT Short Term Goal 2 (Week 3): patient will complete side lying to/from sitting with min A of one OT Short Term Goal 2 - Progress (Week 3): Partly met OT Short Term Goal 3 (Week 3): patient will complete toileting max A of one OT Short Term Goal 3 - Progress (Week 3): Partly met OT Short Term Goal 4 (Week 3): patient will complete LB bathing and dressing mod A with ADs OT Short Term Goal 4 - Progress (Week 3): Partly met Week 4:  OT Short Term Goal 1 (Week 4): patient will complete SB transfers min A OT Short Term Goal 2 (Week 4): patient will complete LB bathing / dressing min A OT Short Term  Goal 3 (Week 4): patient will complete grooming and UB bathing/dressing set up level OT Short Term Goal 4 (Week 4): patient will complete toileting mod A   Alenah Sarria A Noach Calvillo 07/22/2020, 4:24 PM

## 2020-07-22 NOTE — Progress Notes (Signed)
Per therapy patient bump into something. Patient complain of R.leg pain. Attending PA notified. New order place for xray.

## 2020-07-22 NOTE — Discharge Summary (Addendum)
Physician Discharge Summary  Patient ID: Tonya Myers MRN: [id number] DOB/AGE: [birth year] 57 y.o.  Admit date: 06/27/2020 Discharge date: 07/31/2020  Discharge Diagnoses:  Principal Problem:   Acute encephalopathy Active Problems:   CLL (chronic lymphocytic leukemia) (HCC)   Acute hemorrhoid   DDD (degenerative disc disease), cervical   Cervical disc disorder with radiculopathy of cervical region   Cervical facet syndrome   DDD (degenerative disc disease), lumbar   Facet syndrome, lumbar   Bilateral occipital neuralgia   Sacroiliac joint dysfunction   DJD of shoulder   Hypothyroidism   Arthritis   Carpal tunnel syndrome, bilateral   Chronic pain in right shoulder   Controlled type 2 diabetes mellitus without complication, without long-term current use of insulin (HCC)   Essential hypertension   Herpes simplex vulvovaginitis   Pneumococcal meningitis   Bacterial spinal epidural abscess   Meningitis   Labile blood glucose   Uncontrolled type 2 diabetes mellitus with hyperosmolar nonketotic hyperglycemia (HCC)   Transaminitis   Sleep disturbance   Anemia of chronic disease   Slow transit constipation   Urinary retention   Hypokalemia   Acute pain of right knee   Pain   Left upper quadrant abdominal pain   Discharged Condition: Stable  Significant Diagnostic Studies: DG Knee 1-2 Views Right  Result Date: 07/22/2020 CLINICAL DATA:  Right knee pain and swelling, initial encounter EXAM: RIGHT KNEE - 2 VIEW COMPARISON:  07/21/2020 FINDINGS: Degenerative changes are noted in the medial joint space and patellofemoral space. Significant increase in joint effusion is noted. No definitive fracture is seen. IMPRESSION: Degenerative change with large joint effusion. Electronically Signed   By: Inez Catalina M.D.   On: 07/22/2020 19:21   DG Knee 4 Views W/Patella Right  Result Date: 07/21/2020 CLINICAL DATA:  RIGHT knee swelling EXAM: RIGHT KNEE - COMPLETE 4+ VIEW COMPARISON:  None  FINDINGS: Osseous mineralization low normal. Scattered joint space narrowing and spur formation in all 3 compartments. No acute fracture, dislocation, or bone destruction. Minimal joint effusion. IMPRESSION: Degenerative changes and minimal joint effusion RIGHT knee. No acute abnormalities. Electronically Signed   By: Lavonia Dana M.D.   On: 07/21/2020 16:46   DG Abd 2 Views  Result Date: 07/25/2020 CLINICAL DATA:  Diffuse abdominal pain. EXAM: ABDOMEN - 2 VIEW COMPARISON:  CT 05/26/2015 FINDINGS: Elevation of the right hemidiaphragm. No evidence for free air. Nonobstructive bowel gas pattern with gas in the rectum. No large abdominal calcifications but limited evaluation for renal calculi. Lung bases are clear. No acute bone abnormality. IMPRESSION: No acute abnormality. Electronically Signed   By: Markus Daft M.D.   On: 07/25/2020 16:11    Labs:  Basic Metabolic Panel: Recent Labs  Lab 07/28/20 1535  NA 140  K 3.7  CL 103  CO2 28  GLUCOSE 181*  BUN 9  CREATININE 0.71  CALCIUM 9.0    CBC: No results for input(s): WBC, NEUTROABS, HGB, HCT, MCV, PLT in the last 168 hours.  CBG: Recent Labs  Lab 07/29/20 2119 07/30/20 0631 07/30/20 1125 07/30/20 1648 07/30/20 2111  GLUCAP 134* 100* 174* 167* 151*   Family history.  Maternal aunt with breast cancer.  Denies any colon cancer esophageal cancer or rectal cancer  Brief HPI:   Tonya Myers is a 55 y.o. right-handed female with history of diabetes mellitus, CLL, chronic back pain, hypertension.  Per chart review lives with spouse.  Two-level home bed and bath main level independent prior to admission.  Presented 06/09/2020 with  altered mental status and aphasia.  Patient had evidently presented earlier that day left without being seen after having labs done.  She returned to the ED was unable to fully express why she was there with altered mental status noted.  Complaints of neck pain.  Cranial CT scan negative.  MRI normal brain.  EEG  negative for seizure.  Chest x-ray no active disease.  Echocardiogram with ejection fraction of 50 to 55% no wall motion abnormalities.  Admission chemistries unremarkable aside from glucose 274 creatinine 1.35 WBC 15,500 hemoglobin 10.1, troponin negative, hemoglobin A1c 8.1, TSH 5.742, alkaline phosphatase 200 total bilirubin 2.1 direct bilirubin 1.2 SARS coronavirus negative, ammonia negative.  Patient subsequently spiked a fever as well as maintain on insulin therapy suspect DKA presentation.  She was placed on broad-spectrum antibiotics for fever concern for meningitis.  She underwent lumbar puncture, CSF examination showed gram-positive diplococci.  MRI cervical lumbar thoracic spine showed findings compatible with lumbar spinal infection.  There appeared to be septic arthritis in the left L2-3 and 3-4 facets with adjacent paraspinous abscesses.  Ventral epidural abscess L3-4 small but appears mildly progressive.  She was placed on broad-spectrum antibiotics for suspected meningitis.  Patient underwent T3-4 laminectomy T5-7 laminotomy 9-10 laminectomy evacuation of abscess 06/17/2020 per Dr. Lysle Morales with repeat lumbar paraspinous abscess aspiration by interventional radiology 06/19/2020.  Recommendations were to continue ceftriaxone through 07/23/2020.  Patient was cleared to begin Lovenox for DVT prophylaxis.  Close monitoring of renal function.  Due to patient's overall decreased functional ability and need for physical assistance was admitted for a comprehensive rehab program.   Hospital Course: Tonya Myers was admitted to rehab 06/27/2020 for inpatient therapies to consist of PT, ST and OT at least three hours five days a week. Past admission physiatrist, therapy team and rehab RN have worked together to provide customized collaborative inpatient rehab.  Pertaining to patient's acute encephalopathy pneumococcal meningitis paraspinal abscess epidural abscess as well as encephalitis.  Status post T3-4  5-7, T9-10 laminectomy evacuation of abscess repeat lumbar paraspinal abscess aspiration interventional radiology 06/19/2020.  She would follow-up neurosurgery.  Patient did have some initial agitation and restlessness doing well with the use of Seroquel as well as melatonin to help aid in sleep.  Maintained on Lovenox during her hospital stay for DVT prophylaxis.  Pain managed with use of scheduled Robaxin as well as tramadol as needed for breakthrough pain.  In regards to patient's infectious disease pneumococcal meningitis paraspinal abscess she completed her Rocephin 07/23/2020 and would follow-up infectious disease she remained afebrile.  Blood sugars overall controlled hemoglobin A1c 8.0 insulin therapy as well with Glucophage as directed with full diabetic teaching.  She did have a history of CLL follow-up outpatient by Dr. Delight Hoh.  Synthroid ongoing for hypothyroidism.  Anemia chronic disease 8.7 no bleeding episodes.  Blood pressure controlled on Dilacor as well as micardis with no orthostasis and would follow-up outpatient.  Noted obesity BMI 31.  1 9 with dietary follow-up.  She did have a history of genital herpes she remained on Valtrex.  Bouts of urinary retention maintained on Flomax she did initially require in and out catheterizations now emptying without significant residuals.  Patient did have complaints of some right knee pain extremely tender to touch x-rays showed degenerative changes no acute abnormalities Suspect acute gout on chronic degenerative joint disease and was placed on colchicine with benefit.   Blood pressures were monitored on TID basis and controlled  Diabetes has been monitored with  ac/hs CBG checks and SSI was use prn for tighter BS control.    Rehab course: During patient's stay in rehab weekly team conferences were held to monitor patient's progress, set goals and discuss barriers to discharge. At admission, patient required moderate assist supine to sit mod  assist sit to supine minimal assist upper body dressing max is lower body dressing  Physical exam.  Blood pressure 118/70 pulse 80 temperature 98 respirations 18 oxygen saturations 92% room air Constitutional.  No acute distress HEENT Head.  Normocephalic and atraumatic Eyes.  Pupils round and reactive to light no discharge without nystagmus Neck.  Supple nontender no JVD without thyromegaly Cardiac regular rate rhythm without extra sounds or murmur heard Abdomen.  Soft nontender positive bowel sounds without rebound Respiratory effort normal no respiratory distress without wheeze Skin.  Intact IV in place right arm back incision clean and dry Neurologic.  Alert oriented no acute distress mood is a bit flat but appropriate follows commands.  Strength grossly graded 4 -/5 throughout limited by fatigue  He/She  has had improvement in activity tolerance, balance, postural control as well as ability to compensate for deficits. He/She has had improvement in functional use RUE/LUE  and RLE/LLE as well as improvement in awareness.  Patient had propel her wheelchair supervision.  She was able to stand with plus to moderate assist for elevated mat.  She did initially need some increased encouragement for participation but much improved.  She continues demonstrate deficits in muscle weakness impaired timing and sequencing.  Perform multiple rolls with moderate assist for changing her briefs.  Perform slide board transfers to left back to bed with moderate assist.  She is able to don pants bed level with mod max assist.  Clothing management via rolling in bed moderate assist.  Completed trunk mobility posture and core exercises bed level with good carryover of back precautions, upper body conditioning and strengthening exercises.  Speech therapy focus on cognitive testing via CLQT she received scores in mild range for domains, attention 176 memory 169.  She continues demonstrate steady progress with all areas of  her cognitive functioning and was on track to meet upgraded long-term goals at time of discharge.  Full family teaching completed and discharged to home       Disposition: Discharged to home    Diet: Carb modified diet  Special Instructions: No driving smoking or alcohol  Medications at discharge 1.  Tylenol as needed 2.  Voltaren 2 g 4 times daily to affected area 3.  Cardizem 120 mg p.o. daily 4.  Zetia 10 mg p.o. daily 5.  Lantus insulin 35 units daily 6.  Avapro 75 mg p.o. daily 7.  Synthroid 150 mcg p.o. daily 8.  Magnesium oxide 400 mg p.o. twice daily 9.  Melatonin 5 mg nightly 10.  Glucophage 500 mg p.o. twice daily 11.  Robaxin 500 mg p.o. 3 times daily 12.  Singulair 10 mg p.o. nightly 13.  Protonix 40 mg p.o. daily 14.  MiraLAX daily 15.  Seroquel 25 mg p.o. twice daily 16.  Crestor 10 mg p.o. daily 17.  Flomax 0.8 mg daily after breakfast 18.  Tramadol 50 mg every 6 hours as needed pain 19.  Valtrex 1000 mg p.o. twice daily 20.  Neurontin 100 mg at a.m. 200 mg nightly 21.  Colchicine 0.6 mg daily  30-35 minutes were spent completing discharge summary and discharge planning  Discharge Instructions     Ambulatory referral to Physical Medicine Rehab  Complete by: As directed    Moderate complexity follow-up 1 to 2 weeks pneumococcal meningitis        Follow-up Information     Jamse Arn, MD Follow up.   Specialty: Physical Medicine and Rehabilitation Why: Office to call for appointment Contact information: [address] [place] [address] [phone]         Deetta Perla, MD Follow up.   Specialty: Neurosurgery Why: Call for appointment Contact information: [place] [address] [phone]         [name], MD Follow up.   Specialty: Infectious Diseases Why: Call for appointment Contact information: [place] [address] [phone]                  Signed: Cathlyn Parsons 07/31/2020, 6:29 AM Patient was seen, face-face, and physical exam performed by me on day of discharge, greater than 30 minutes of total time spent.. Please see progress note from day of discharge as well.  Delice Lesch, MD, ABPMR

## 2020-07-22 NOTE — Progress Notes (Signed)
Speech Language Pathology Daily Session Note  Patient Details  Name: Anneke Cundy MRN: [id number] Date of Birth: 1966-04-24  Today's Date: 07/22/2020 SLP Individual Time: 1003-1059 SLP Individual Time Calculation (min): 56 min  Short Term Goals: Week 3: SLP Short Term Goal 1 (Week 3): STG=LTG's (ELOS: 3/31)  Skilled Therapeutic Interventions:Skilled ST services focused on cognitive skills. SLP facilitated complex problem solving and working memory in high level deductive reasoning task, pt required supervision A verbal cues. Pt also completed short term recall tasks follow short paragraphs utilizing association and visualization strategies, pt required min-supervision A verbal cues. Pt expressed desire to extend say due to continued physical deficits. Pt agreed to reduced ST services x1-3 a week to allow increasing participation in PT/OT services. Pt was left in room with call bell within reach and bed alarm set. SLP recommends to continue skilled services.     Pain Pain Assessment Pain Score: 0-No pain  Therapy/Group: Individual Therapy  [place]  [facility] 07/22/2020, 11:22 AM

## 2020-07-22 NOTE — Progress Notes (Addendum)
Patient ID: Tonya Myers, female   DOB: June 18, 1965, 56 y.o.   MRN: [id number] Attempted to see pt's [name] when went into room at 12:00 pt reported she went to the store but will be back this afternoon. Attempted to call her and left a message. Pt asked if was going to be able to stay longer and informed her up to MD and team. She made MD aware of her wanting to stay another week to continue to make progress and be more consistent in her levels. Will try to see Aunt when she comes back to visit this afternoon to answer her questions.  2:30 Pm Met with pt, [name] and Aunt-[name] to answer questions regarding insurance coverage, home health and OP and pt's levels. Pt seems to feel if she can not go to the bathroom on her own then she needs to stay here, which is not the case. She wants another week but then reported two weeks. Discussed will need to have a medical need to stay here and the fact she can not go to the bathroom on her own does not justify her staying here. [name] asked if MD cold make discharge and informed them of the Medicare appeal process but if medically stable can be managed in another venue-ie SNF or home setting. Will await team conference tomorrow. Aware MD is the one who makes the decision regarding extension

## 2020-07-22 NOTE — Progress Notes (Signed)
Occupational Therapy Session Note  Patient Details  Name: Tonya Myers MRN: [id number] Date of Birth: Apr 18, 1966  Today's Date: 07/22/2020 OT Individual Time: 1100-1200 OT Individual Time Calculation (min): 60 min    Short Term Goals: Week 3:  OT Short Term Goal 1 (Week 3): patient will complete sit pivot transfers min A of one OT Short Term Goal 2 (Week 3): patient will complete side lying to/from sitting with min A of one OT Short Term Goal 3 (Week 3): patient will complete toileting max A of one OT Short Term Goal 4 (Week 3): patient will complete LB bathing and dressing mod A with ADs  Skilled Therapeutic Interventions/Progress Updates:    Patient in bed, alert, notes ongoing pain in right knee - + inflammation.  Patient and husband report probable gout.  Pain limiting her ability to complete bed mobility, transfers and LB bathing/dressing.  She was able to participate in all mobility and adl activities this session but not performing as well as prior sessions.  Overall mod/max A for LB dressing at bed level, rolling with assistance to support right leg, side lying to sitting edge of bed with mod A.  SB transfer to w/c mod A of one.  Completed oral care and grooming tasks at sink with set up and increased time.  She is able to propel w/c approx 30 feet.  Completed reassessment of box and blocks: L = 45 (prior 18,35)  R = 41 (prior 23,32) SB transfer back to bed max A due to knee pain.  Sit to supine mod A of 2.  She remained in bed at close of session, bed alarm set and callbell in hand.  Husband, Tonya Myers, present.    Therapy Documentation Precautions:  Precautions Precautions: Fall,Back Restrictions Weight Bearing Restrictions: No Other Position/Activity Restrictions: spinal  Therapy/Group: Individual Therapy  Carlos Levering 07/22/2020, 7:37 AM

## 2020-07-23 DIAGNOSIS — G9340 Encephalopathy, unspecified: Secondary | ICD-10-CM | POA: Diagnosis not present

## 2020-07-23 DIAGNOSIS — M25561 Pain in right knee: Secondary | ICD-10-CM | POA: Diagnosis not present

## 2020-07-23 DIAGNOSIS — R52 Pain, unspecified: Secondary | ICD-10-CM

## 2020-07-23 DIAGNOSIS — D638 Anemia in other chronic diseases classified elsewhere: Secondary | ICD-10-CM | POA: Diagnosis not present

## 2020-07-23 DIAGNOSIS — G039 Meningitis, unspecified: Secondary | ICD-10-CM | POA: Diagnosis not present

## 2020-07-23 DIAGNOSIS — M1990 Unspecified osteoarthritis, unspecified site: Secondary | ICD-10-CM

## 2020-07-23 LAB — GLUCOSE, CAPILLARY
Glucose-Capillary: 121 mg/dL — ABNORMAL HIGH (ref 70–99)
Glucose-Capillary: 192 mg/dL — ABNORMAL HIGH (ref 70–99)
Glucose-Capillary: 147 mg/dL — ABNORMAL HIGH (ref 70–99)
Glucose-Capillary: 157 mg/dL — ABNORMAL HIGH (ref 70–99)

## 2020-07-23 MED ORDER — SODIUM CHLORIDE 0.9 % IV SOLN
INTRAVENOUS | Status: DC | PRN
  Administered 2020-07-23: 250 mL via INTRAVENOUS

## 2020-07-23 NOTE — Progress Notes (Signed)
Speech Language Pathology Weekly Progress and Session Note  Patient Details  Name: Tonya Myers MRN: [id number] Date of Birth: 08/12/1965  Beginning of progress report period: 07/17/2020  End of progress report period: 07/23/2020  Today's Date: 07/23/2020 SLP Individual Time: 4401-0272 SLP Individual Time Calculation (min): 45 min  Short Term Goals: Week 3: SLP Short Term Goal 1 (Week 3): STG=LTG's SLP Short Term Goal 1 - Progress (Week 3): Progressing toward goal    New Short Term Goals: Week 4: SLP Short Term Goal 1 (Week 4): STG=LTG  Weekly Progress Updates:  Patient continues to demonstrate steady progress with cognitive function and ST has decreased her frequency to 3 out of 7 days. Team meeting on 3/30 discussed and decided upon seeking extension of one week due to patient still progressing. Patient herself was also requesting extension so she could improve more.    Intensity: Minumum of 1-2 x/day, 30 to 90 minutes Frequency: 1 to 3 out of 7 days Duration/Length of Stay: 4/7 Treatment/Interventions: Cognitive remediation/compensation;Cueing hierarchy;Functional tasks;Medication managment;Internal/external aids;Patient/family education;Speech/Language facilitation   Daily Session  Skilled Therapeutic Interventions: Patient seen for skilled ST to address cognitive goals. She was sitting in wheelchair when SLP entered room (typically she has been in bed) and was a little frustrated with new onset right knee pain from hitting her knee while operating power chair during therapy session previous date. She demonstrated good anticipatory awareness during guided discussion. SLP presented variety of pill boxes and patient chose the one that she felt would be most beneficial. She placed simulated pills in pill box without needing assistance. Plan to continue working on medication management next visit.  General    Pain Pain Assessment Pain Scale: 0-10 Pain Score: 4  Pain Type:  Acute pain Pain Location: Knee Pain Orientation: Right Pain Descriptors / Indicators: Aching Pain Onset: Gradual Pain Intervention(s): MD notified (Comment) (MD in room during end of session and discussed pain with patient) Multiple Pain Sites: No  Therapy/Group: Individual Therapy  Tonya Baller, MA, CCC-SLP Speech Therapy

## 2020-07-23 NOTE — Progress Notes (Signed)
Physical Therapy Session Note  Patient Details  Name: Tonya Myers MRN: [id number] Date of Birth: May 06, 1965  Today's Date: 07/23/2020 PT Individual Time: 7829-5621 PT Individual Time Calculation (min): 30 min   Short Term Goals: Week 4:  PT Short Term Goal 1 (Week 4): STG=LTG due to ELOS  Skilled Therapeutic Interventions/Progress Updates:    Patient in supine and reports hurting all over and tired from OT so does not want to try OOB, but agreeable to in bed therex.  Patient performed ankle circles x 20, heel slides with A x 10, hip adductor squeezes w/ 5 sec hold x 10, glut sets w/ 5 sec hold x 10, quad sets w/5 sec hold x 10, hip abduction x 10 w/ assist.  Declined UE exercise as had already done with OT earlier.  Adjusted up in bed with +2 A and assist to don jacket with pt able to lift up to long sitting using rails.  Patient left in supine with Aunt in the room and call bell in reach. Patient missed 30 minutes of skilled PT due to pain/ fatigue.  Therapy Documentation Precautions:  Precautions Precautions: Fall,Back Restrictions Weight Bearing Restrictions: No Other Position/Activity Restrictions: spinal General: PT Amount of Missed Time (min): 30 Minutes PT Missed Treatment Reason: Pain;Patient fatigue Pain: Pain Assessment Faces Pain Scale: Hurts even more Pain Type: Acute pain Pain Location: Generalized Pain Descriptors / Indicators: Aching;Sore Pain Onset: On-going Pain Intervention(s): Rest;Repositioned   Therapy/Group: Individual Therapy  Reginia Naas  Magda Kiel, PT 07/23/2020, 4:41 PM

## 2020-07-23 NOTE — Progress Notes (Signed)
Occupational Therapy Session Note  Patient Details  Name: Tonya Myers MRN: [id number] Date of Birth: 02-Jan-1966  Today's Date: 07/23/2020 OT Individual Time: 0800-0900 OT Individual Time Calculation (min): 60 min    Short Term Goals: Week 4:  OT Short Term Goal 1 (Week 4): patient will complete SB transfers min A OT Short Term Goal 2 (Week 4): patient will complete LB bathing / dressing min A OT Short Term Goal 3 (Week 4): patient will complete grooming and UB bathing/dressing set up level OT Short Term Goal 4 (Week 4): patient will complete toileting mod A  Skilled Therapeutic Interventions/Progress Updates:    Patient in bed, alert and ready for therapy session.  She notes pain in right knee at "7".  Husband present for session.  LB bathing and dressing completed bed level - she is able to wash all with exception of right lower leg with set up.  Max A to use bed pan - did not transfer to commode at this time due to pain.  Able to complete hygiene with set up.  Max A to place clean incontinence brief.  Mod a for pants over feet and hips.  Rolling in bed with min A in both directions today.  Side lying to sitting edge of bed mod a.  donns sneakers in sitting max A.  SB transfer to w/c min A with stand by of 2nd - cues to lift vs slide.  UB bathing/dressing w/c level min A, oral care set up.   Assisted to therapy gym via w/c - completed UB ergometer 2 x 3 minutes - able to maintain grasp without assist.  She remained seated in w/c at close of session, seat belt alarm set and call bell in reach.      Therapy Documentation Precautions:  Precautions Precautions: Fall,Back Restrictions Weight Bearing Restrictions: No Other Position/Activity Restrictions: spinal  Therapy/Group: Individual Therapy  Carlos Levering 07/23/2020, 7:35 AM

## 2020-07-23 NOTE — Progress Notes (Signed)
PROGRESS NOTE   Subjective/Complaints: Patient seen sitting up in her chair this morning, working with therapies.  She states she did not sleep well overnight due to knee pain.  She notes yesterday that she sustained trauma to her knee, twisting it, while trying to navigate her power chair through a small doorway.  Repeat films ordered.  However, she does note improvement with colchicine prior to that as well as thereafter.  ROS: + Right knee pain, improving.  Denies CP, SOB, N/V/D  Objective:   DG Knee 1-2 Views Right  Result Date: 07/22/2020 CLINICAL DATA:  Right knee pain and swelling, initial encounter EXAM: RIGHT KNEE - 2 VIEW COMPARISON:  07/21/2020 FINDINGS: Degenerative changes are noted in the medial joint space and patellofemoral space. Significant increase in joint effusion is noted. No definitive fracture is seen. IMPRESSION: Degenerative change with large joint effusion. Electronically Signed   By: Inez Catalina M.D.   On: 07/22/2020 19:21   DG Knee 4 Views W/Patella Right  Result Date: 07/21/2020 CLINICAL DATA:  RIGHT knee swelling EXAM: RIGHT KNEE - COMPLETE 4+ VIEW COMPARISON:  None FINDINGS: Osseous mineralization low normal. Scattered joint space narrowing and spur formation in all 3 compartments. No acute fracture, dislocation, or bone destruction. Minimal joint effusion. IMPRESSION: Degenerative changes and minimal joint effusion RIGHT knee. No acute abnormalities. Electronically Signed   By: Lavonia Dana M.D.   On: 07/21/2020 16:46   Recent Labs    07/21/20 0845 07/22/20 1244  WBC 7.1 8.1  HGB 8.7* 9.3*  HCT 29.0* 31.6*  PLT 418* 456*   Recent Labs    07/21/20 0845  NA 138  K 3.7  CL 105  CO2 25  GLUCOSE 151*  BUN 5*  CREATININE 0.71  CALCIUM 8.8*    Intake/Output Summary (Last 24 hours) at 07/23/2020 1050 Last data filed at 07/23/2020 0700 Gross per 24 hour  Intake 480 ml  Output --  Net 480 ml         Physical Exam: Vital Signs Blood pressure (!) 123/91, pulse (!) 109, temperature 98.4 F (36.9 C), temperature source Oral, resp. rate 18, height 6' (1.829 m), weight 99.7 kg, SpO2 100 %. Constitutional: No distress . Vital signs reviewed. HENT: Normocephalic.  Atraumatic. Eyes: EOMI. No discharge. Cardiovascular: No JVD.  RRR. Respiratory: Normal effort.  No stridor.  Bilateral clear to auscultation. GI: Non-distended.  BS +. Skin: Warm and dry.  Intact. Psych: Normal mood.  Normal behavior. Musc: Right knee with edema and tenderness, improving. Neurologic: Alert Motor: Bilateral upper extremities: 4/5 proximal distal, grip 4-/5, unchanged Bilateral lower extremities: Hip flexion 2+/5, distally 4+/5, (pain inhibition) Bilateral upper extremity ataxia, some improvement  Assessment/Plan: 1. Functional deficits which require 3+ hours per day of interdisciplinary therapy in a comprehensive inpatient rehab setting.  Physiatrist is providing close team supervision and 24 hour management of active medical problems listed below.  Physiatrist and rehab team continue to assess barriers to discharge/monitor patient progress toward functional and medical goals  Care Tool:  Bathing  Bathing activity did not occur: Refused Body parts bathed by patient: Right arm,Left arm,Chest,Abdomen,Front perineal area,Right upper leg,Left upper leg,Face   Body parts bathed  by helper: Buttocks,Right lower leg,Left lower leg     Bathing assist Assist Level: Moderate Assistance - Patient 50 - 74%     Upper Body Dressing/Undressing Upper body dressing Upper body dressing/undressing activity did not occur (including orthotics): Refused What is the patient wearing?: Pull over shirt    Upper body assist Assist Level: Minimal Assistance - Patient > 75%    Lower Body Dressing/Undressing Lower body dressing    Lower body dressing activity did not occur: Refused What is the patient wearing?:  Pants     Lower body assist Assist for lower body dressing: Moderate Assistance - Patient 50 - 74%     Toileting Toileting Toileting Activity did not occur Landscape architect and hygiene only): Refused  Toileting assist Assist for toileting: 2 Helpers     Transfers Chair/bed transfer  Transfers assist     Chair/bed transfer assist level: 2 Helpers     Locomotion Ambulation   Ambulation assist      Assist level: 2 helpers Assistive device: Tour manager Max distance: 71ft   Walk 10 feet activity   Assist  Walk 10 feet activity did not occur: Safety/medical concerns        Walk 50 feet activity   Assist Walk 50 feet with 2 turns activity did not occur: Safety/medical concerns         Walk 150 feet activity   Assist Walk 150 feet activity did not occur: Safety/medical concerns         Walk 10 feet on uneven surface  activity   Assist Walk 10 feet on uneven surfaces activity did not occur: Safety/medical concerns         Wheelchair     Assist Will patient use wheelchair at discharge?: Yes Type of Wheelchair: Power Wheelchair activity did not occur: Safety/medical concerns  Wheelchair assist level: Minimal Assistance - Patient > 75% Max wheelchair distance: 200    Wheelchair 50 feet with 2 turns activity    Assist    Wheelchair 50 feet with 2 turns activity did not occur: Safety/medical concerns   Assist Level: Minimal Assistance - Patient > 75%   Wheelchair 150 feet activity     Assist  Wheelchair 150 feet activity did not occur: Safety/medical concerns   Assist Level: Minimal Assistance - Patient > 75%   Medical Problem List and Plan: 1.  Decreased functional ability secondary to acute encephalopathy.  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    Continue [name]  Team conference today to discuss current and goals and coordination of care, home and environmental barriers, and discharge planning with nursing, case manager, and therapies. Please see conference note from today as well.  2.  Antithrombotics: -DVT/anticoagulation: Continue Lovenox 52.5 mg daily.              -antiplatelet therapy: N/A 3. Pain Management: Oxycodone as needed  Robaxin 500 3 times daily scheduled on 3/12, continue  Right knee extremely tender to touch on 3/28  See #23  Improving 4. Mood: Team support             -antipsychotic agents: N/A 5. Neuropsych: This patient is not capable of making decisions on her own behalf.  Requires consistent positive reinforcement 6. Skin/Wound Care: Routine skin checks 7. Fluids/Electrolytes/Nutrition: Routine in and outs 8.  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  Continue IV Rocephin through 07/23/2020 and follow-up infectious disease.  Repeat blood cultures pending 9.  Diabetes mellitus with hyperglycemia/DKA.  Hemoglobin A1c 8.0.  Check blood sugars before meals and at bedtime  CBG (last 3)  Recent Labs    07/22/20 1637 07/22/20 2132 07/23/20 0610  GLUCAP 183* 163* 121*   Lantus insulin 45 units daily, decreased to 40 on 3/16, decreased to 35 on 3/22  Decreased Novolog to 3U TID, DC'd on 3/29  Increased Metformin to 500mg  BID.   Slightly labile on 3/30 10.  CLL.  Follow-up outpatient Dr. Delight Hoh 11.  Hypothyroidism.  Synthroid 12.  Anemia of chronic disease.  Multifactorial.   Hemoglobin 9.3 on 3/29  Continue to monitor 13.  Hypertension.  Dilacor 120 mg daily,Micardis 80-25 mg daily           Vitals:   07/22/20 1951 07/23/20 0537  BP: (!) 149/93 (!) 123/91  Pulse: (!) 103 (!) 109  Resp: 17 18  Temp: 98.8 F (37.1 C) 98.4 F (36.9 C)  SpO2: 100% 150%   Some diastolic elevation on 5/69 14.  Transaminitis: Resolved.     Resolved on 3/28. 15.  Obesity.  BMI 31.19.  Dietary follow-up 16.  History of asthma.  Continue Singulair 10 mg nightly.   17.  History of genital herpes.  Conitnue Valtrex 18.  Sleep disturbance  Melatonin  ECG reviewed, trazodone 25 nightly started on 3/12  Improving overall 19.  Slow transit constipation  Bowel meds increased on 3/9, increased again on 3/11, decreased on 3/21 to optimize bowel function  Improving overall 20.  Hypokalemia  Potassium 3.7 on 3/28  Supplemented x2 days on 3/21 21.  Urinary retention  Flomax increased on 3/11  Continue I/O caths as necessary  Emptying now without significant residual 22. Tachycardia:   Continue Cardizem  Borderline on 3/30 23.  Right knee pain-gout versus septic arthritis with history of chronic knee pain on acute trauma  Extremely tender to touch > along medial lateral joint lines  X-ray personally reviewed, and with patient, degenerative changes with some effusion  Voltaren gel ordered with benefit  Uric acid ordered  Blood cultures ordered  May consider joint aspiration with fluid analysis if no improvement by tomorrow  Colchicine started on 3/29, will avoid steroids at present unless clear diagnosis of gout  Improving    LOS: 26 days A FACE TO FACE EVALUATION WAS PERFORMED  Tonya Myers 07/23/2020, 10:50 AM

## 2020-07-23 NOTE — Progress Notes (Signed)
Patient ID: Tonya Myers, female   DOB: [birth year], 55 y.o.   MRN: [id number]  Met with pt and [name] to update on team conference progress, pt has had a set back due to hitting her knee on a cart and having gout now. Both happy MD agreed to extension and pt will work on getting to more min assist level. New discharge date 4/7. Made pr aware this will not be extended again unless a medical issues arises. She needs to take this week and do as much as she can to improve and work through the pain. Will update home health regarding change in discharge date and hospital bed being delivered to home today.

## 2020-07-23 NOTE — Patient Care Conference (Signed)
Inpatient RehabilitationTeam Conference and Plan of Care Update Date: 07/23/2020   Time: 11:48 AM    Patient Name: Tonya Myers      Medical Record Number: [id number]  Date of Birth: May 09, 1965 Sex: Female         Room/Bed: 4W20C/4W20C-01 Payor Info: Payor: MEDICARE / Plan: MEDICARE PART A AND B / Product Type: *No Product type* /    Admit Date/Time:  06/27/2020 12:46 PM  Primary Diagnosis:  Acute encephalopathy  Hospital Problems: Principal Problem:   Acute encephalopathy Active Problems:   CLL (chronic lymphocytic leukemia) (HCC)   Acute hemorrhoid   DDD (degenerative disc disease), cervical   Cervical disc disorder with radiculopathy of cervical region   Cervical facet syndrome   DDD (degenerative disc disease), lumbar   Facet syndrome, lumbar   Bilateral occipital neuralgia   Sacroiliac joint dysfunction   DJD of shoulder   Hypothyroidism   Arthritis   Carpal tunnel syndrome, bilateral   Chronic pain in right shoulder   Controlled type 2 diabetes mellitus without complication, without long-term current use of insulin (HCC)   Essential hypertension   Herpes simplex vulvovaginitis   Pneumococcal meningitis   Bacterial spinal epidural abscess   Meningitis   Labile blood glucose   Uncontrolled type 2 diabetes mellitus with hyperosmolar nonketotic hyperglycemia (HCC)   Transaminitis   Sleep disturbance   Anemia of chronic disease   Slow transit constipation   Urinary retention   Hypokalemia   Acute pain of right knee   Pain    Expected Discharge Date: Expected Discharge Date: 07/31/20  Team Members Present: Physician leading conference: Dr. Delice Lesch Care Coodinator Present: [name], RN, BSN, CRRN;Becky Dupree, [name] Nurse Present: [name], RN PT Present: Magda Kiel, PT OT Present: Elisabeth Most, OT SLP Present: Nadara Mode, SLP PPS Coordinator present : Gunnar Fusi, Novella Olive, PT     Current Status/Progress Goal Weekly Team Focus   Bowel/Bladder   Continent b/b with occassional bladder incontinence LBM 3/27  Pt to be fully continent.  Assess q shift, offer timed toileting   Swallow/Nutrition/ Hydration             ADL's   LB bathing/dressing bed level mod/max A, UB bathing/dressing w/c level min A, SB transfers mod A (can be min A), toileting max A - limited Mon/Tues due to new right knee pain - was doing better at end of last week  min A  trunk/core strength, UB NMRE, adl/transfer training, toileting, patient/family education, DME   Mobility   min to mod A bed mobility due to R knee pain, mod A slide board transfers to +2 if in pain in knee.  w/c mobility in power loaner chair with min A.  min A w/c level  power chair maneuvering, transfers, family ed, strengthening, conditioning.   Communication             Safety/Cognition/ Behavioral Observations  Supervision A, reduced to x3 a week asking fo 30 minutes  Mod I, Supervision A  higher level memory, alertnating/divided attention and complex problem solving   Pain   Rt knee pain 6-7 out of 10 PRN tylenol, tramadol and scheduled voltaren- limited relief  Pain level <3/10  Assess PRN   Skin   Back incision closed  no new breakdown  Assess q shift and PRN     Discharge Planning:  Pt and husband asking for extension due to new knee pain and limiting her. Aunt here from [facility] and assisting with  preparing for DC. Await team's decision regarding extension   Team Discussion: Patient was improving up until 2 nights ago when c/o knee pain and swelling. Hx of arthritic pain complicated by gout flair and acute trauma 07/22/20 when trial power chair; MD added medications.   Patient on target to meet rehab goals: yes, patient is making progress. Currently nub-mod assit for slide-board transfers. Toileting is difficult at times due to pain; using bedpan prn.   *See Care Plan and progress notes for long and short-term goals.   Revisions to Treatment Plan:  SLP reduced  therapy to 3x/week.  Teaching Needs: Transfer, toileting, medications, etc.   Current Barriers to Discharge: Decreased caregiver support and Home enviroment access/layout Power chair trials  Possible Resolutions to Barriers: Family education with significant other and aunt     Medical Summary Current Status: Decreased functional ability secondary to acute encephalopathy  Barriers to Discharge: Medical stability;Behavior;IV antibiotics;Incontinence;Decreased family/caregiver support   Possible Resolutions to [employer]: Therapies, cont abx, Optimize DM/HTN meds, follow labs - Hb, continue bowel/bladder meds, optimize pain meds for right knee pain   Continued Need for Acute Rehabilitation Level of Care: The patient requires daily medical management by a physician with specialized training in physical medicine and rehabilitation for the following reasons: Direction of a multidisciplinary physical rehabilitation program to maximize functional independence : Yes Medical management of patient stability for increased activity during participation in an intensive rehabilitation regime.: Yes Analysis of laboratory values and/or radiology reports with any subsequent need for medication adjustment and/or medical intervention. : Yes   I attest that I was present, lead the team conference, and concur with the assessment and plan of the team.   [name] 07/23/2020, 4:23 PM

## 2020-07-24 DIAGNOSIS — G9340 Encephalopathy, unspecified: Secondary | ICD-10-CM | POA: Diagnosis not present

## 2020-07-24 LAB — GLUCOSE, CAPILLARY
Glucose-Capillary: 110 mg/dL — ABNORMAL HIGH (ref 70–99)
Glucose-Capillary: 140 mg/dL — ABNORMAL HIGH (ref 70–99)
Glucose-Capillary: 118 mg/dL — ABNORMAL HIGH (ref 70–99)
Glucose-Capillary: 129 mg/dL — ABNORMAL HIGH (ref 70–99)

## 2020-07-24 NOTE — Progress Notes (Signed)
PROGRESS NOTE   Subjective/Complaints: Patient seen sitting up in bed this morning.  Significant other bedside.  She states she slept well overnight.  She notes improvement in pain in her knee.  ROS: + Right knee pain, improving.  Denies CP, SOB, N/V/D  Objective:   DG Knee 1-2 Views Right  Result Date: 07/22/2020 CLINICAL DATA:  Right knee pain and swelling, initial encounter EXAM: RIGHT KNEE - 2 VIEW COMPARISON:  07/21/2020 FINDINGS: Degenerative changes are noted in the medial joint space and patellofemoral space. Significant increase in joint effusion is noted. No definitive fracture is seen. IMPRESSION: Degenerative change with large joint effusion. Electronically Signed   By: Inez Catalina M.D.   On: 07/22/2020 19:21   Recent Labs    07/22/20 1244  WBC 8.1  HGB 9.3*  HCT 31.6*  PLT 456*   No results for input(s): NA, K, CL, CO2, GLUCOSE, BUN, CREATININE, CALCIUM in the last 72 hours.  Intake/Output Summary (Last 24 hours) at 07/24/2020 1052 Last data filed at 07/24/2020 0851 Gross per 24 hour  Intake 750 ml  Output --  Net 750 ml        Physical Exam: Vital Signs Blood pressure (!) 125/52, pulse 94, temperature 98.4 F (36.9 C), resp. rate 18, height 6' (1.829 m), weight 99.7 kg, SpO2 100 %. Constitutional: No distress . Vital signs reviewed. HENT: Normocephalic.  Atraumatic. Eyes: EOMI. No discharge. Cardiovascular: No JVD.  RRR. Respiratory: Normal effort.  No stridor.  Bilateral clear to auscultation. GI: Non-distended.  BS +. Skin: Warm and dry.  Intact. Psych: Normal mood.  Normal behavior. Musc: Right knee with edema and tenderness, stable. Neurologic: Alert Motor: Bilateral upper extremities: 4/5 proximal distal, grip 4-/5, unchanged Bilateral lower extremities: Hip flexion 2+/5, distally 4+/5, (left stronger than right, some pain inhibition) Bilateral upper extremity ataxia, some  improvement  Assessment/Plan: 1. Functional deficits which require 3+ hours per day of interdisciplinary therapy in a comprehensive inpatient rehab setting.  Physiatrist is providing close team supervision and 24 hour management of active medical problems listed below.  Physiatrist and rehab team continue to assess barriers to discharge/monitor patient progress toward functional and medical goals  Care Tool:  Bathing  Bathing activity did not occur: Refused Body parts bathed by patient: Right arm,Left arm,Chest,Abdomen,Front perineal area,Right upper leg,Left upper leg,Face,Buttocks,Left lower leg   Body parts bathed by helper: Right lower leg,Buttocks     Bathing assist Assist Level: Minimal Assistance - Patient > 75%     Upper Body Dressing/Undressing Upper body dressing Upper body dressing/undressing activity did not occur (including orthotics): Refused What is the patient wearing?: Pull over shirt    Upper body assist Assist Level: Minimal Assistance - Patient > 75%    Lower Body Dressing/Undressing Lower body dressing    Lower body dressing activity did not occur: Refused What is the patient wearing?: Pants,Incontinence brief     Lower body assist Assist for lower body dressing: Moderate Assistance - Patient 50 - 74%     Toileting Toileting Toileting Activity did not occur Landscape architect and hygiene only): Refused  Toileting assist Assist for toileting: Maximal Assistance - Patient 25 - 49%  Transfers Chair/bed transfer  Transfers assist     Chair/bed transfer assist level: 2 Helpers     Locomotion Ambulation   Ambulation assist      Assist level: 2 helpers Assistive device: Ethelene Hal Max distance: 78ft   Walk 10 feet activity   Assist  Walk 10 feet activity did not occur: Safety/medical concerns        Walk 50 feet activity   Assist Walk 50 feet with 2 turns activity did not occur: Safety/medical concerns         Walk  150 feet activity   Assist Walk 150 feet activity did not occur: Safety/medical concerns         Walk 10 feet on uneven surface  activity   Assist Walk 10 feet on uneven surfaces activity did not occur: Safety/medical concerns         Wheelchair     Assist Will patient use wheelchair at discharge?: Yes Type of Wheelchair: Power Wheelchair activity did not occur: Safety/medical concerns  Wheelchair assist level: Minimal Assistance - Patient > 75% Max wheelchair distance: 200    Wheelchair 50 feet with 2 turns activity    Assist    Wheelchair 50 feet with 2 turns activity did not occur: Safety/medical concerns   Assist Level: Minimal Assistance - Patient > 75%   Wheelchair 150 feet activity     Assist  Wheelchair 150 feet activity did not occur: Safety/medical concerns   Assist Level: Minimal Assistance - Patient > 75%   Medical Problem List and Plan: 1.  Decreased functional ability secondary to acute encephalopathy.  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    Continue [name] 2.  Antithrombotics: -DVT/anticoagulation: Continue Lovenox 52.5 mg daily.              -antiplatelet therapy: N/A 3. Pain Management: Oxycodone as needed  Robaxin 500 3 times daily scheduled on 3/12, continue  Right knee extremely tender to touch on 3/28  See #23  Improving 4. Mood: Team support             -antipsychotic agents: N/A 5. Neuropsych: This patient is not capable of making decisions on her own behalf.  Requires consistent positive reinforcement 6. Skin/Wound Care: Routine skin checks 7. Fluids/Electrolytes/Nutrition: Routine in and outs 8.  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    Completed course of Rocephin on 3/30  and follow-up infectious disease.  Repeat blood cultures remain pending 9.  Diabetes mellitus with hyperglycemia/DKA.  Hemoglobin A1c 8.0.  Check blood sugars before meals and at bedtime  CBG (last 3)  Recent Labs    07/23/20 1636 07/23/20 2128 07/24/20 0618  GLUCAP 147* 157* 110*   Lantus insulin 45 units daily, decreased to 40 on 3/16, decreased to 35 on 3/22  Decreased Novolog to 3U TID, DC'd on 3/29  Increased Metformin to 500mg  BID.   Slightly labile on 3/31 10.  CLL.  Follow-up outpatient Dr. Delight Hoh 11.  Hypothyroidism.  Synthroid 12.  Anemia of chronic disease.  Multifactorial.   Hemoglobin 9.3 on 3/29  Continue to monitor 13.  Hypertension.  Dilacor 120 mg daily,Micardis 80-25 mg daily           Vitals:   07/23/20 2009 07/24/20 0522  BP: 124/79 (!) 125/52  Pulse: 96 94  Resp: 18 18  Temp:  98.7 F (37.1 C) 98.4 F (36.9 C)  SpO2: 100% 100%   Relatively controlled on 3/31 14.  Transaminitis: Resolved.    Resolved on 3/28. 15.  Obesity.  BMI 31.19.  Dietary follow-up 16.  History of asthma.  Continue Singulair 10 mg nightly.   17.  History of genital herpes.  Conitnue Valtrex 18.  Sleep disturbance  Melatonin  ECG reviewed, trazodone 25 nightly started on 3/12  Improving overall 19.  Slow transit constipation  Bowel meds increased on 3/9, increased again on 3/11, decreased on 3/21 to optimize bowel function  Improving overall 20.  Hypokalemia  Potassium 3.7 on 3/28  Supplemented x2 days on 3/21 21.  Urinary retention  Flomax increased on 3/11  Continue I/O caths as necessary  Emptying now without significant residual 22. Tachycardia:   Continue Cardizem  Borderline on 3/30 23.  Right knee pain-clinically appears to be gout on chronic degenerative joint disease  X-ray personally reviewed, and with patient, degenerative changes with some effusion  Voltaren gel ordered with benefit  Blood cultures ordered  May consider joint aspiration with fluid  analysis if no improvement  Colchicine started on 3/29, will avoid steroids at present unless clear diagnosis of gout  Improving    LOS: 27 days A FACE TO FACE EVALUATION WAS PERFORMED  Ivyrose Hashman Lorie Phenix 07/24/2020, 10:52 AM

## 2020-07-24 NOTE — Progress Notes (Signed)
Physical Therapy Note  Patient Details  Name: Tonya Myers MRN: [id number] Date of Birth: 10-30-1965 Today's Date: 07/24/2020    Attempted to see patient to make up missed therapy time. Patient resting comfortably in the bed with her husband at bedside upon PT arrival. Patient declined PT at this time due to patient reporting that she "finally felt comfortable," and stated she would like to continue to rest at this time. Offered bed level exercises, toileting, sitting balance, and change of positioning to which patient declined all offers of skilled interventions.    Maddelyn Rocca L Naina Sleeper PT, DPT  07/24/2020, 2:33 PM

## 2020-07-25 ENCOUNTER — Inpatient Hospital Stay (HOSPITAL_COMMUNITY): Payer: Medicare Other

## 2020-07-25 DIAGNOSIS — D638 Anemia in other chronic diseases classified elsewhere: Secondary | ICD-10-CM | POA: Diagnosis not present

## 2020-07-25 DIAGNOSIS — G9340 Encephalopathy, unspecified: Secondary | ICD-10-CM | POA: Diagnosis not present

## 2020-07-25 DIAGNOSIS — R1012 Left upper quadrant pain: Secondary | ICD-10-CM

## 2020-07-25 DIAGNOSIS — I10 Essential (primary) hypertension: Secondary | ICD-10-CM | POA: Diagnosis not present

## 2020-07-25 DIAGNOSIS — E876 Hypokalemia: Secondary | ICD-10-CM | POA: Diagnosis not present

## 2020-07-25 LAB — GLUCOSE, CAPILLARY
Glucose-Capillary: 95 mg/dL (ref 70–99)
Glucose-Capillary: 130 mg/dL — ABNORMAL HIGH (ref 70–99)
Glucose-Capillary: 144 mg/dL — ABNORMAL HIGH (ref 70–99)
Glucose-Capillary: 200 mg/dL — ABNORMAL HIGH (ref 70–99)

## 2020-07-25 IMAGING — DX DG ABDOMEN 2V
3 series · 3 of 3 positions shown · non-contrast
Comparison: CT [DATE]

CLINICAL DATA: Diffuse abdominal pain.

EXAM:
ABDOMEN - 2 VIEW

[x abdomen decub]
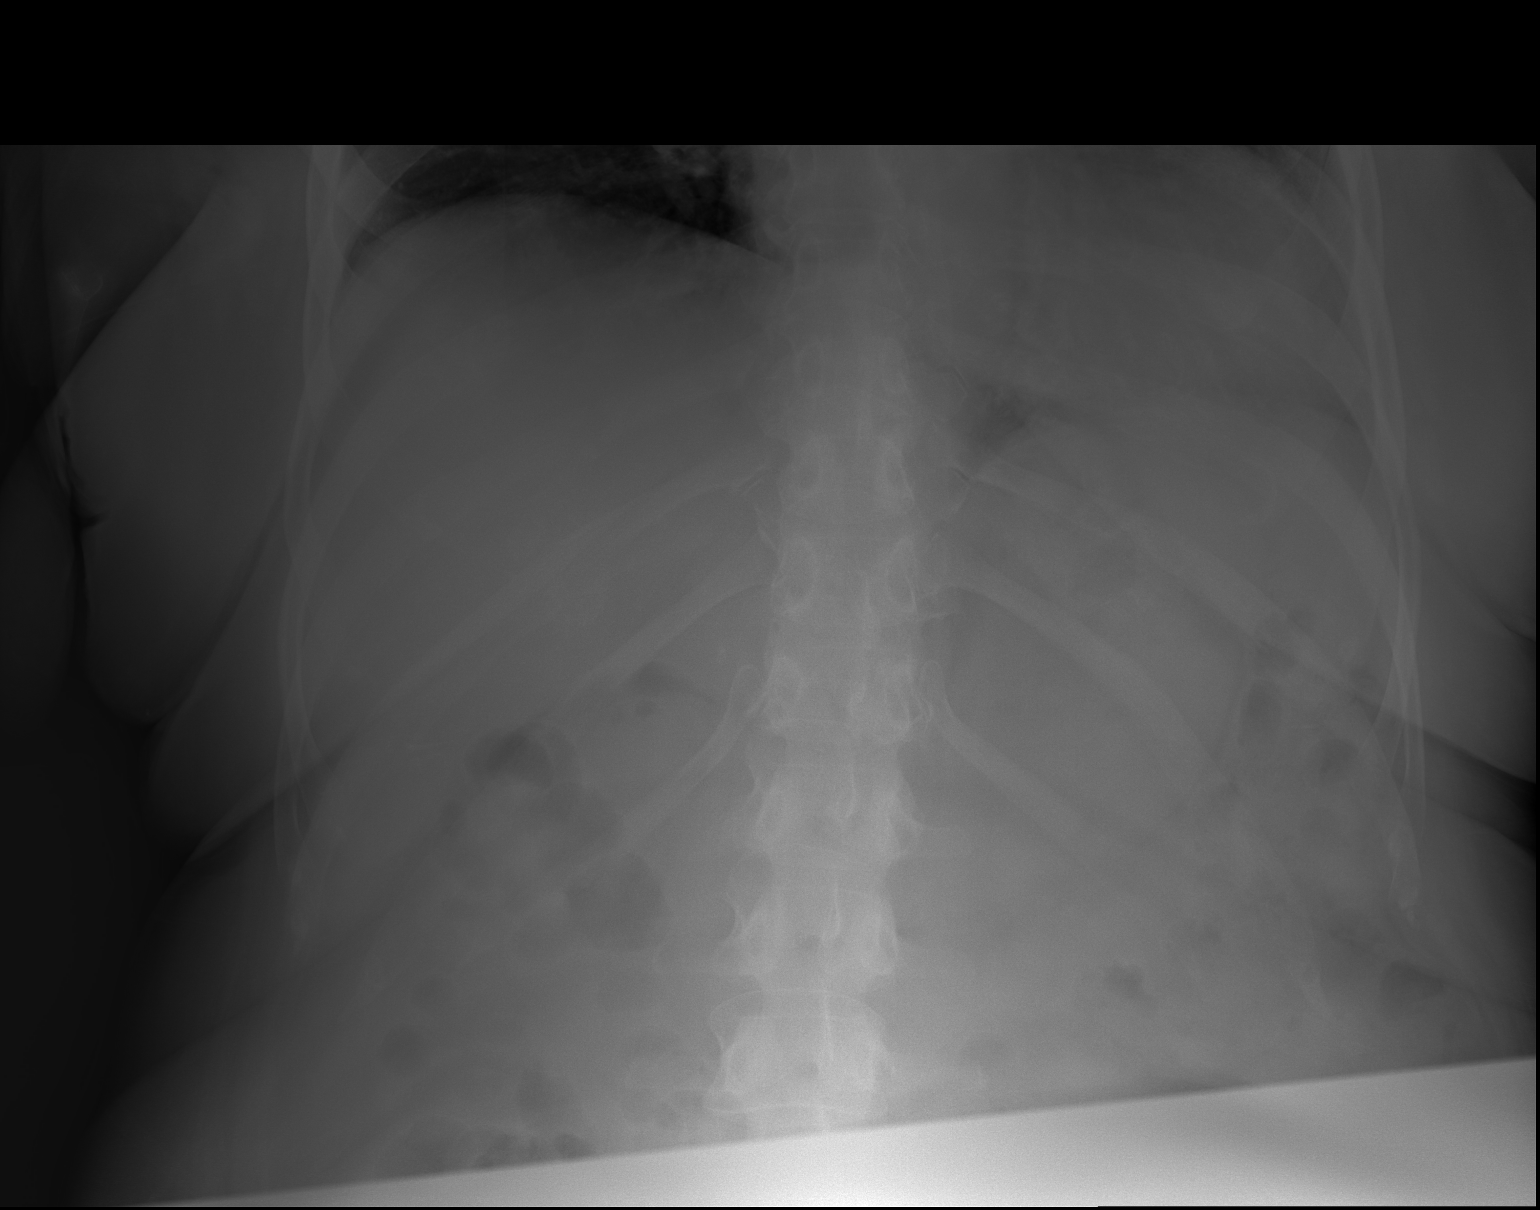

[t abdomen supine (1 of 2)]
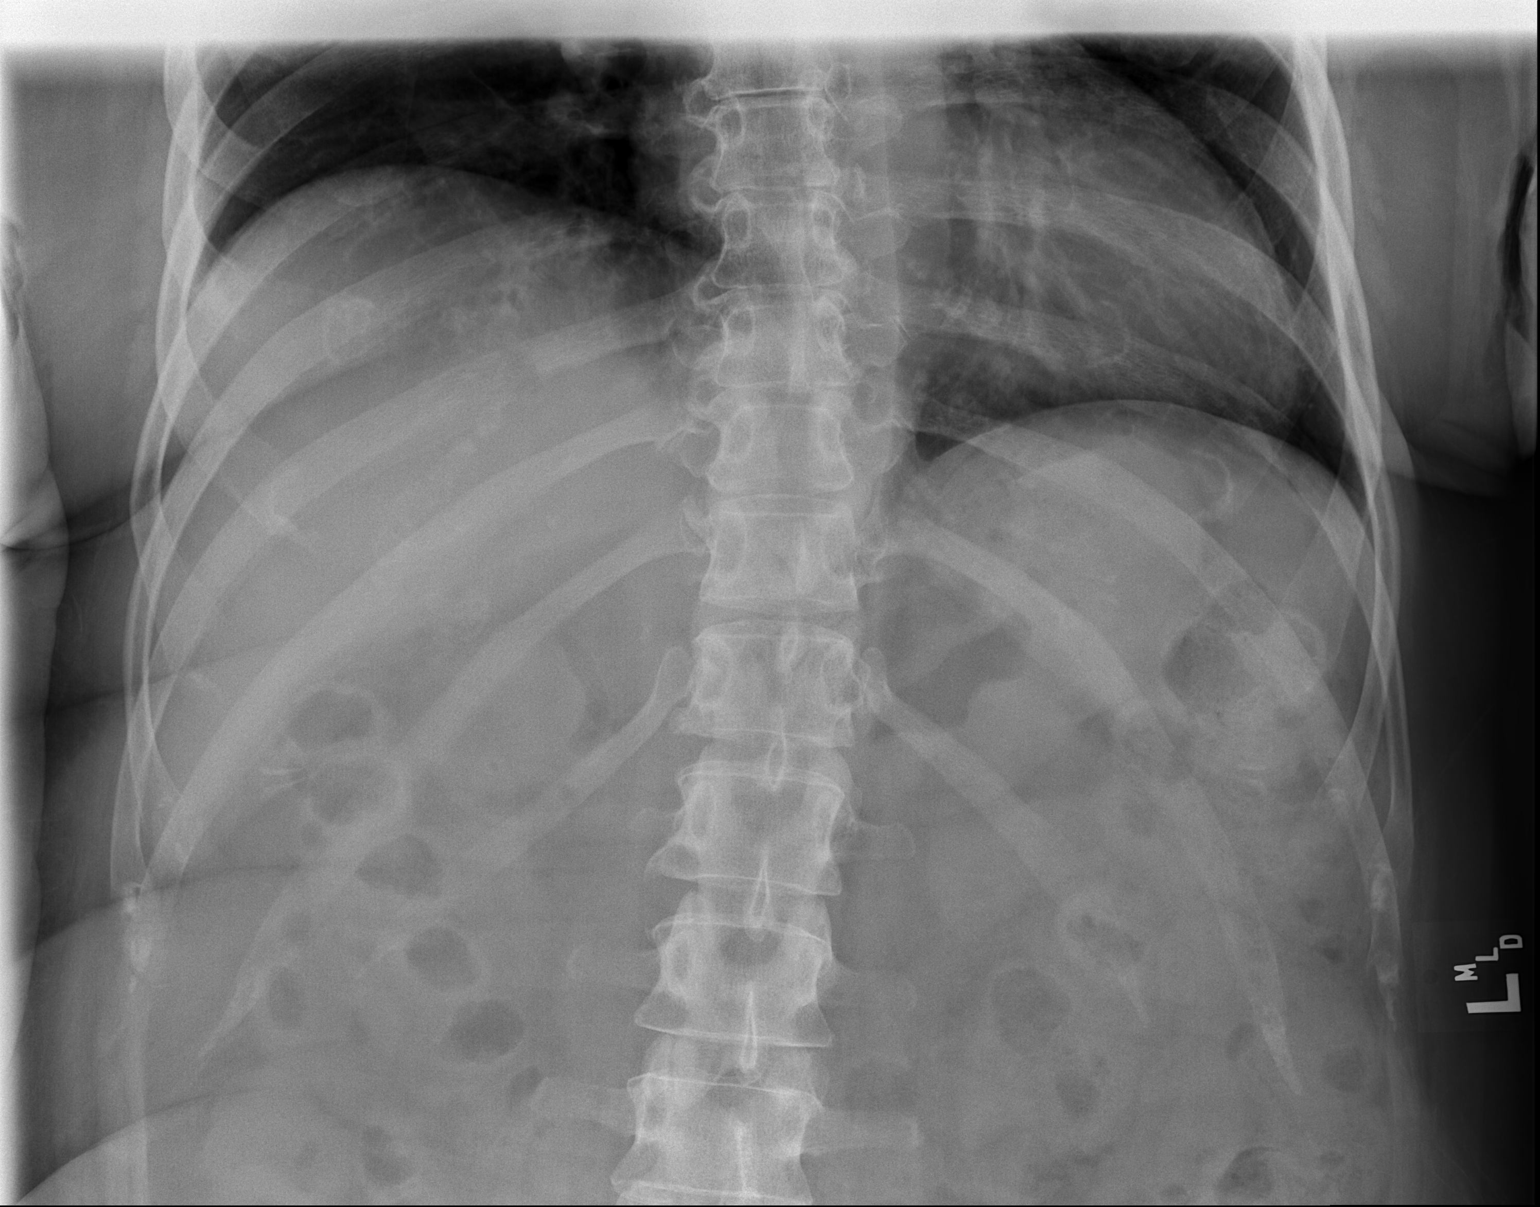

[t abdomen supine (2 of 2)]
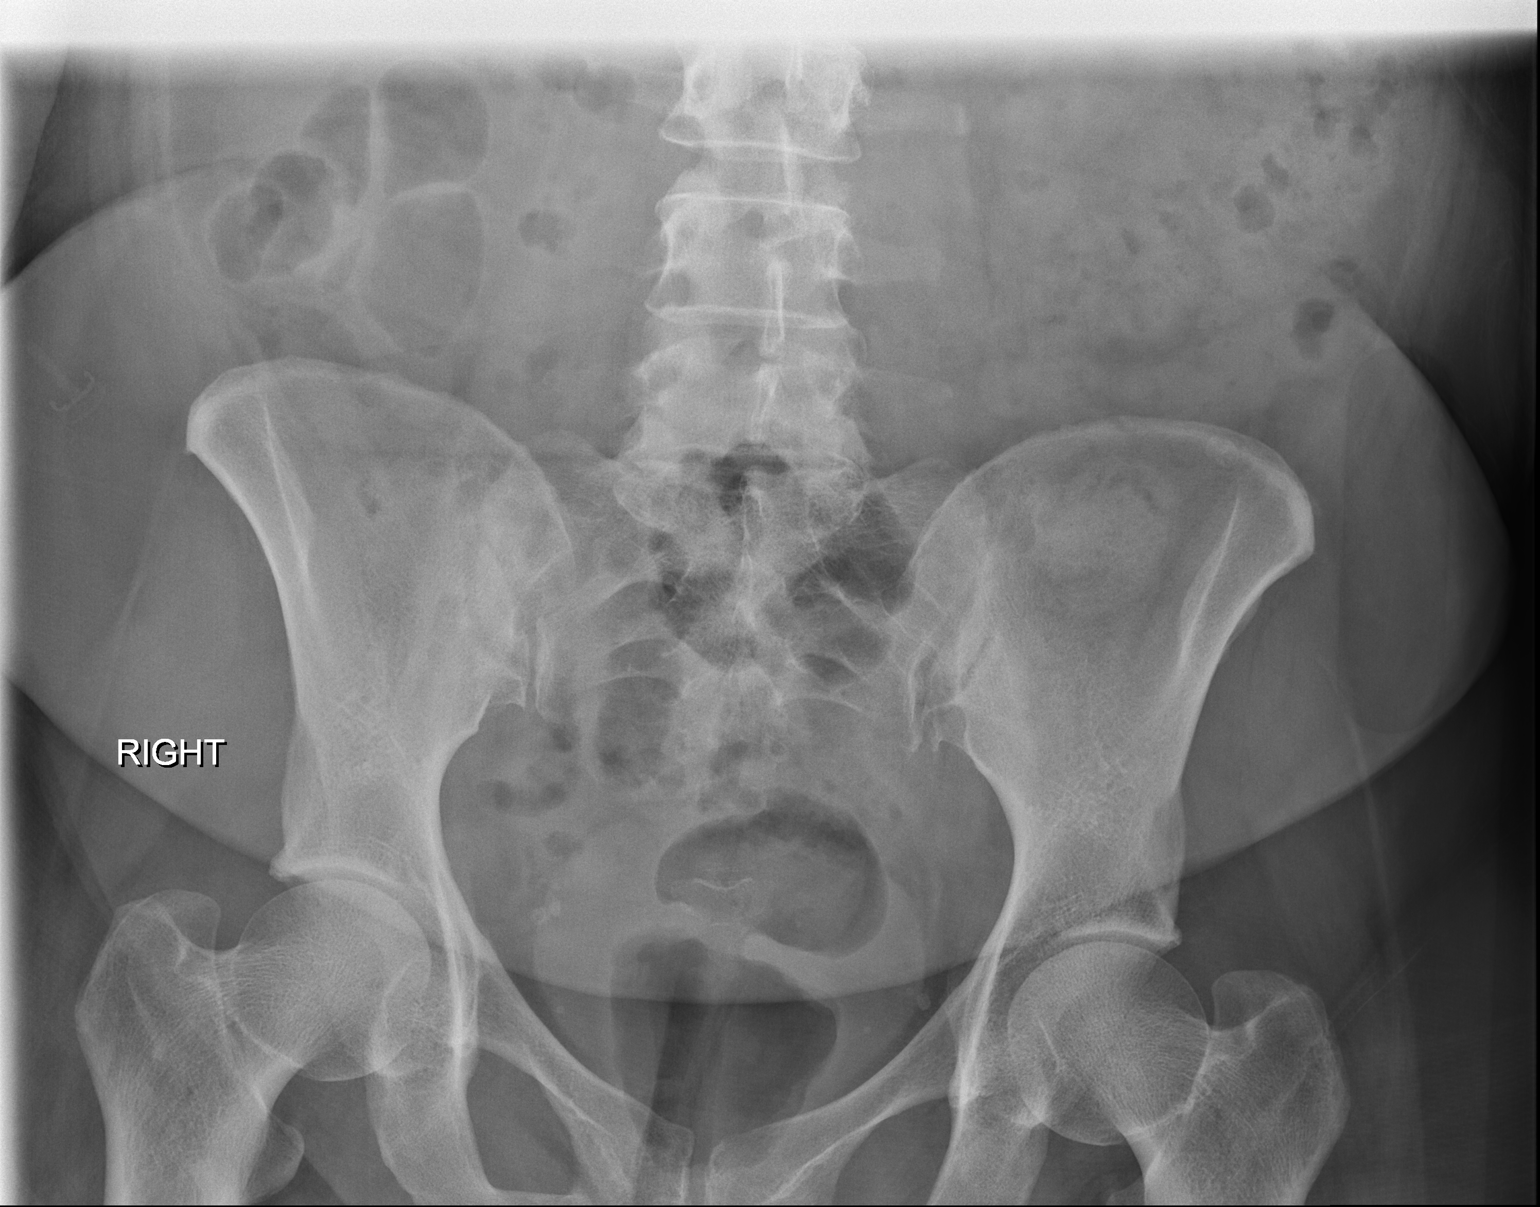

[3 of 3 positions shown; findings below may reference images not displayed]

FINDINGS: Elevation of the right hemidiaphragm. No evidence for free air.
Nonobstructive bowel gas pattern with gas in the rectum. No large
abdominal calcifications but limited evaluation for renal calculi.
Lung bases are clear. No acute bone abnormality.
IMPRESSION: No acute abnormality.

## 2020-07-25 NOTE — Progress Notes (Signed)
Pt is very concerned about her left hip, she states "its in a lot of pain", and would like to have an xray done on her hip. Repositioning in bed, and medication have been given to help reduce pain.  [name]

## 2020-07-25 NOTE — Progress Notes (Signed)
Physical Therapy Session Note  Patient Details  Name: Tonya Myers MRN: [id number] Date of Birth: [birth year]  Today's Date: 07/25/2020 PT Missed Time: 47 Minutes Missed Time Reason: Patient fatigue (Woken early morning and unable to return to sleep all day.)  Short Term Goals: Week 3:  PT Short Term Goal 1 (Week 3): Patient to perform squat pivot transfers consistently with +1 A. PT Short Term Goal 1 - Progress (Week 3): Partly met PT Short Term Goal 2 (Week 3): Patient to perform sit to stand from elevated mat with +2 mod A PT Short Term Goal 2 - Progress (Week 3): Met PT Short Term Goal 3 (Week 3): Patient to propel w/c x 100' with S. PT Short Term Goal 3 - Progress (Week 3): Progressing toward goal PT Short Term Goal 4 (Week 3): Patient able to participate in w/c set up for transfer with min cues. PT Short Term Goal 4 - Progress (Week 3): Progressing toward goal PT Short Term Goal 5 (Week 3): Patient to tolerate standing in device x 75 seconds to allow for standing ADL's. PT Short Term Goal 5 - Progress (Week 3): Met Week 4:  PT Short Term Goal 1 (Week 4): STG=LTG due to ELOS  Skilled Therapeutic Interventions/Progress Updates:    Patient supine in bed upon PT arrival. Patient fatigued and unaware of who PT is. On indicating to pt that therapist is here for PT, pt states that she was not aware that she had another session this day and thought she was done for the day after her PT session at 11am and is not prepared for another therapy session. Pt's schedule also does not indicate this session with this PT. Pt relates being woken prior to 4:30am for vitals and then woken again for blood sugar readings. She never did fall asleep prior to breakfast arriving. Is fatigued and is not agreeable to any therapy at this time despite offers for bed level mobility and exercises.   Therapy Documentation Precautions:  Precautions Precautions: Fall,Back Restrictions Weight Bearing Restrictions:  No Other Position/Activity Restrictions: spinal General: PT Amount of Missed Time (min): 30 Minutes PT Missed Treatment Reason: Patient fatigue (Woken early morning and unable to return to sleep all day.)   Therapy/Group: Individual Therapy  Alger Simons 07/25/2020, 2:25 PM

## 2020-07-25 NOTE — Progress Notes (Signed)
Occupational Therapy Session Note  Patient Details  Name: Tonya Myers MRN: [id number] Date of Birth: [birth year]  Today's Date: 07/25/2020 OT Individual Time: 8921-1941 OT Individual Time Calculation (min): 60 min    Short Term Goals: Week 4:  OT Short Term Goal 1 (Week 4): patient will complete SB transfers min A OT Short Term Goal 2 (Week 4): patient will complete LB bathing / dressing min A OT Short Term Goal 3 (Week 4): patient will complete grooming and UB bathing/dressing set up level OT Short Term Goal 4 (Week 4): patient will complete toileting mod A  Skilled Therapeutic Interventions/Progress Updates:    Treatment session with focus on self-care retraining, functional transfers, and BUE strengthening/ROM. Pt received semi-reclined in bed agreeable to self-care tasks.  Pt declined washing buttocks, reporting recently cleaned by nursing staff.  Engaged in [place] with pt able to recall hemi-dressing technique, still requiring max-total assist to thread pant legs.  Pt completed rolling R and L to allow therapist to pull pants over hips.  Pt required mod assist for sidelying to sitting, pt providing therapist directions regarding typical routine for bed mobility.  Completed slide board transfer min-mod assist with +2 to stabilize w/c.  Pt demonstrating increased weight shifting during transfer.  Engaged in [place] bathing/dressing and oral care seated at sink.  Pt propelling w/c 30' x2 with supervision, noted some veering towards R.  Engaged in 3 mins forward and 3 mins backward on UBE for BUE strengthening and endurance.  Pt returned to room and transferred back to bed mod assist slide board.  Pt remained upright in chair position in w/c with all needs in reach.  Therapy Documentation Precautions:  Precautions Precautions: Fall,Back Restrictions Weight Bearing Restrictions: No Other Position/Activity Restrictions: spinal Pain:  Pt with c/o pain in R knee, rated 7/10.  Pt also with  c/o pain in L ribs/abdomen. RN notified.   Therapy/Group: Individual Therapy  Kaelene Elliston, [place] 07/25/2020, 8:16 AM

## 2020-07-25 NOTE — Progress Notes (Signed)
Physical Therapy Session Note  Patient Details  Name: Tonya Myers MRN: [id number] Date of Birth: 09/02/1965  Today's Date: 07/25/2020 PT Individual Time: 1100-1200 PT Individual Time Calculation (min): 60 min   Short Term Goals: Week 4:  PT Short Term Goal 1 (Week 4): STG=LTG due to ELOS  Skilled Therapeutic Interventions/Progress Updates:    Patient in supine and requesting to toilet on BSC.  Patient supine to sit with S using rail and increased time.  Donned shoes max A in sitting.  Performed slide board transfer to drop arm bariatric BSC with mod A.  Leaning for doffing pants and brief using armrests with max A.  Patient toileted and performed perineal hygiene to front with S and max A for hygiene posterior.  Patient max A for donning brief and pants with lateral leans on BSC.  Slide board back to bed with +2 A due to slide board sliding off BSC on the way back to bed.  Performed transfer to power sit & go w/c with mod A (+2 for safety).  Performed w/c mobility with cues and close S in open controlled environment with S and cues and placed cones for pt to move around again with cues and S.  Patient performed turns with cues to take down to slowest setting then looped around area with other patients slowing appropriately in doorways and when passing somene.  Patient propelled in to room and overall approximately 500' with S.  She needed help to set up safely for transfer and performed slide board transfer to R with spouse assist and min A from therapist for board stability and cues for spouse's positioning and Deyra's forward leaning.  Sit to supine with spouse assist and S.  Repositioned for comfort and left with spouse in the room and needs in reach.  Therapy Documentation Precautions:  Precautions Precautions: Fall,Back Restrictions Weight Bearing Restrictions: No Other Position/Activity Restrictions: spinal Pain: Pain Assessment Faces Pain Scale: Hurts little more Pain Type: Acute  pain Pain Location: Generalized Pain Descriptors / Indicators: Aching Pain Onset: On-going Pain Intervention(s): Ambulation/increased activity;Rest    Therapy/Group: Individual Therapy  Reginia Naas  [place], [place] 07/25/2020, 3:24 PM

## 2020-07-25 NOTE — Progress Notes (Signed)
PROGRESS NOTE   Subjective/Complaints: Patient seen sitting up in her chair this morning working with therapies.  She states she slept fairly well overnight.  She states her knee pain is tolerable but she has significant abdominal pain which she states has been present for 2 weeks.  This is a first-time patient has mentioned this to me.  Discussed with therapies, patient complained of abdominal pain with therapies as well.  ROS: + Left upper quadrant abdominal pain.  Denies CP, SOB, N/V/D  Objective:   No results found. Recent Labs    07/22/20 1244  WBC 8.1  HGB 9.3*  HCT 31.6*  PLT 456*   No results for input(s): NA, K, CL, CO2, GLUCOSE, BUN, CREATININE, CALCIUM in the last 72 hours. No intake or output data in the 24 hours ending 07/25/20 1223      Physical Exam: Vital Signs Blood pressure (!) 148/84, pulse 84, temperature 98.8 F (37.1 C), temperature source Oral, resp. rate 14, height 6' (1.829 m), weight 99.7 kg, SpO2 100 %. Constitutional: No distress . Vital signs reviewed. HENT: Normocephalic.  Atraumatic. Eyes: EOMI. No discharge. Cardiovascular: No JVD.  RRR. Respiratory: Normal effort.  No stridor.  Bilateral clear to auscultation. GI: Non-distended.  BS +. Skin: Warm and dry.  Intact. Psych: Normal mood.  Normal behavior. Musc: Right knee with edema and tenderness, unchanged. Neurologic: Alert Motor: Bilateral upper extremities: 4/5 proximal distal, grip 4-/5, stable Bilateral lower extremities: Hip flexion 2+/5, distally 4+/5, (left stronger than right, some pain inhibition) Bilateral upper extremity ataxia, some improvement  Assessment/Plan: 1. Functional deficits which require 3+ hours per day of interdisciplinary therapy in a comprehensive inpatient rehab setting.  Physiatrist is providing close team supervision and 24 hour management of active medical problems listed below.  Physiatrist and rehab  team continue to assess barriers to discharge/monitor patient progress toward functional and medical goals  Care Tool:  Bathing  Bathing activity did not occur: Refused Body parts bathed by patient: Right arm,Left arm,Chest,Abdomen   Body parts bathed by helper: Right lower leg,Buttocks     Bathing assist Assist Level: Set up assist     Upper Body Dressing/Undressing Upper body dressing Upper body dressing/undressing activity did not occur (including orthotics): Refused What is the patient wearing?: Pull over shirt    Upper body assist Assist Level: Minimal Assistance - Patient > 75%    Lower Body Dressing/Undressing Lower body dressing    Lower body dressing activity did not occur: Refused What is the patient wearing?: Pants,Incontinence brief     Lower body assist Assist for lower body dressing: Maximal Assistance - Patient 25 - 49%     Toileting Toileting Toileting Activity did not occur Landscape architect and hygiene only): Refused  Toileting assist Assist for toileting: Maximal Assistance - Patient 25 - 49%     Transfers Chair/bed transfer  Transfers assist     Chair/bed transfer assist level: 2 Helpers     Locomotion Ambulation   Ambulation assist      Assist level: 2 helpers Assistive device: Walker-Eva Max distance: 49ft   Walk 10 feet activity   Assist  Walk 10 feet activity did not occur: Safety/medical  concerns        Walk 50 feet activity   Assist Walk 50 feet with 2 turns activity did not occur: Safety/medical concerns         Walk 150 feet activity   Assist Walk 150 feet activity did not occur: Safety/medical concerns         Walk 10 feet on uneven surface  activity   Assist Walk 10 feet on uneven surfaces activity did not occur: Safety/medical concerns         Wheelchair     Assist Will patient use wheelchair at discharge?: Yes Type of Wheelchair: Power Wheelchair activity did not occur:  Safety/medical concerns  Wheelchair assist level: Minimal Assistance - Patient > 75% Max wheelchair distance: 200    Wheelchair 50 feet with 2 turns activity    Assist    Wheelchair 50 feet with 2 turns activity did not occur: Safety/medical concerns   Assist Level: Minimal Assistance - Patient > 75%   Wheelchair 150 feet activity     Assist  Wheelchair 150 feet activity did not occur: Safety/medical concerns   Assist Level: Minimal Assistance - Patient > 75%   Medical Problem List and Plan: 1.  Decreased functional ability secondary to acute encephalopathy.  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    Continue [name] 2.  Antithrombotics: -DVT/anticoagulation: Continue Lovenox 52.5 mg daily.              -antiplatelet therapy: N/A 3. Pain Management: Oxycodone as needed  Robaxin 500 3 times daily scheduled on 3/12, continue  Right knee extremely tender to touch on 3/28  See #23  Lidoderm patch added to left upper quadrant on 4/1 4. Mood: Team support             -antipsychotic agents: N/A 5. Neuropsych: This patient is not capable of making decisions on her own behalf.  Requires consistent positive reinforcement 6. Skin/Wound Care: Routine skin checks 7. Fluids/Electrolytes/Nutrition: Routine in and outs 8.  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    Completed course of Rocephin on 3/30 and follow-up infectious disease.  Repeat blood cultures remain pending 9.  Diabetes mellitus with hyperglycemia/DKA.  Hemoglobin A1c 8.0.  Check blood sugars before meals and at bedtime  CBG (last 3)  Recent Labs    07/24/20 2056 07/25/20 0521 07/25/20 1214  GLUCAP 129* 95 130*   Lantus insulin 45 units daily, decreased to 40 on 3/16,  decreased to 35 on 3/22  Decreased Novolog to 3U TID, DC'd on 3/29  Increased Metformin to 500mg  BID.   Relatively controlled on 4/1 10.  CLL.  Follow-up outpatient Dr. Delight Hoh 11.  Hypothyroidism.  Synthroid 12.  Anemia of chronic disease.  Multifactorial.   Hemoglobin 9.3 on 3/29  Continue to monitor 13.  Hypertension.  Dilacor 120 mg daily,Micardis 80-25 mg daily           Vitals:   07/24/20 1336 07/24/20 2046  BP: 121/62 (!) 148/84  Pulse: 96 84  Resp: 20 14  Temp: 99.6 F (37.6 C) 98.8 F (37.1 C)  SpO2: 100% 100%   Relatively controlled on 4/1 14.  Transaminitis: Resolved.    Resolved on 3/28. 15.  Obesity.  BMI 31.19.  Dietary follow-up 16.  History of asthma.  Continue Singulair 10 mg nightly.   17.  History of genital herpes.  Conitnue Valtrex 18.  Sleep disturbance  Melatonin  ECG reviewed, trazodone 25 nightly started on 3/12  Improving overall 19.  Slow transit constipation  Bowel meds increased on 3/9, increased again on 3/11, decreased on 3/21 to optimize bowel function  Improving overall 20.  Hypokalemia  Potassium 3.7 on 3/28, labs ordered for Monday  Supplemented x2 days on 3/21 21.  Urinary retention  Flomax increased on 3/11  Continue I/O caths as necessary  Emptying now without significant residual 22. Tachycardia:   Continue Cardizem  Borderline on 3/30 23.  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  Left upper quadrant abdominal pain-appears to be muscular  See #3  X-ray ordered    LOS: 28 days A FACE TO FACE EVALUATION WAS PERFORMED  Nishi Neiswonger Lorie Phenix 07/25/2020, 12:23 PM

## 2020-07-25 NOTE — Progress Notes (Signed)
Patient ID: Tonya Myers, female   DOB: [birth year], 55 y.o.   MRN: [id number] Follow up with patient and significant other [name]). Patient and [name] deny questions regarding management of HTN, HLD and DM. Reviewed low salt-CMM diet and both noted review of handouts given and feel comfortable managing meals. Reviewed gout and dietary modifications she can make to decrease flairs as well. Reports main focus is on toilet transfers for the next week before discharge. Aunt participating in therapy training as well. Continue to follow along to discharge to address any educational needs that arise. [name]

## 2020-07-25 NOTE — Progress Notes (Signed)
Speech Language Pathology Daily Session Note  Patient Details  Name: Tonya Myers MRN: [id number] Date of Birth: January 09, 1966  Today's Date: 07/25/2020 SLP Individual Time: 0905-1000 SLP Individual Time Calculation (min): 55 min  Short Term Goals: Week 4: SLP Short Term Goal 1 (Week 4): STG=LTG  Skilled Therapeutic Interventions:   Patient seen for skilled ST session focusing on cognitive function goals with her Aunt and later her husband present. She was able to complete simulated pill box task using list of her 10+medications with supervision fading to mod I. Patient also demonstrating good awareness, telling SLP, "this is good for dexterity too" when taking small beads out of pill bottles. Pill box was without any errors as per SLP inspection. Patient commented, "that took just about the full hour" and she and SLP discussed importance of her having extra time when at home to work on tasks such as this. Her husband stated he could help her but patient said she wanted to be able to do the things she could do by herself. Patient continues to benefit from skilled SLP intervention but appears to be approaching mod I level for cognition.  Pain Pain Assessment Pain Scale: 0-10 Pain Score: 0-No pain Faces Pain Scale: Hurts little more Pain Type: Acute pain Pain Location: Generalized Pain Descriptors / Indicators: Aching Pain Onset: On-going Pain Intervention(s): Ambulation/increased activity;Rest  Therapy/Group: Individual Therapy  [name], MA, CCC-SLP Speech Therapy

## 2020-07-26 DIAGNOSIS — M5010 Cervical disc disorder with radiculopathy, unspecified cervical region: Secondary | ICD-10-CM

## 2020-07-26 DIAGNOSIS — M47816 Spondylosis without myelopathy or radiculopathy, lumbar region: Secondary | ICD-10-CM

## 2020-07-26 LAB — GLUCOSE, CAPILLARY
Glucose-Capillary: 101 mg/dL — ABNORMAL HIGH (ref 70–99)
Glucose-Capillary: 111 mg/dL — ABNORMAL HIGH (ref 70–99)
Glucose-Capillary: 165 mg/dL — ABNORMAL HIGH (ref 70–99)
Glucose-Capillary: 151 mg/dL — ABNORMAL HIGH (ref 70–99)

## 2020-07-26 MED ORDER — TRAZODONE HCL 50 MG PO TABS
50.0000 mg | ORAL_TABLET | Freq: Every day | ORAL | Status: DC
  Administered 2020-07-26 – 2020-07-30 (×5): 50 mg via ORAL
  Filled 2020-07-26 (×5): qty 1

## 2020-07-26 MED ORDER — GABAPENTIN 600 MG PO TABS
300.0000 mg | ORAL_TABLET | Freq: Two times a day (BID) | ORAL | Status: DC
  Administered 2020-07-26 – 2020-07-27 (×3): 300 mg via ORAL
  Filled 2020-07-26 (×3): qty 1

## 2020-07-26 NOTE — Progress Notes (Signed)
Speech Language Pathology Daily Session Note  Patient Details  Name: Tonya Myers MRN: [id number] Date of Birth: [birth year]  Today's Date: 07/26/2020 SLP Individual Time: 1045-1130 SLP Individual Time Calculation (min): 45 min  Short Term Goals: Week 4: SLP Short Term Goal 1 (Week 4): STG=LTG  Skilled Therapeutic Interventions: Pt seen for skilled ST with focus on cognitive goals, husband present throughout. SLP facilitating money counting/functional mathematics task by providing extra time and Supervision level verbal cues. At times, patient answering questions impulsively, able to self-correct with 100% accuracy. Pt states she has moments of apprehension regarding upcoming discharge home because she has been hospitalized for extended time. Pt demonstrates good insight into current medical condition/precautions. SLP, pt and husband discussing strategies to increase patient independence and safety with daily living tasks at home while reducing anxiety and apprehension. Pt left in bed with alarm set and husband present. Cont ST POC.   Pain Pain Assessment Pain Scale: 0-10 Pain Score: 4  Pain Location: Back Pain Intervention(s): Repositioned  Therapy/Group: Individual Therapy  Dewaine Conger 07/26/2020, 12:38 PM

## 2020-07-26 NOTE — Progress Notes (Signed)
Physical Therapy Session Note  Patient Details  Name: Tonya Myers MRN: [id number] Date of Birth: [birth year]  Today's Date: 07/26/2020 PT Individual Time: 0805-0917 PT Individual Time Calculation (min): 72 min   Short Term Goals: Week 3:  PT Short Term Goal 1 (Week 3): Patient to perform squat pivot transfers consistently with +1 A. PT Short Term Goal 1 - Progress (Week 3): Partly met PT Short Term Goal 2 (Week 3): Patient to perform sit to stand from elevated mat with +2 mod A PT Short Term Goal 2 - Progress (Week 3): Met PT Short Term Goal 3 (Week 3): Patient to propel w/c x 100' with S. PT Short Term Goal 3 - Progress (Week 3): Progressing toward goal PT Short Term Goal 4 (Week 3): Patient able to participate in w/c set up for transfer with min cues. PT Short Term Goal 4 - Progress (Week 3): Progressing toward goal PT Short Term Goal 5 (Week 3): Patient to tolerate standing in device x 75 seconds to allow for standing ADL's. PT Short Term Goal 5 - Progress (Week 3): Met Week 4:  PT Short Term Goal 1 (Week 4): STG=LTG due to ELOS  Skilled Therapeutic Interventions/Progress Updates:  Patient supine in bed upon PT arrival. RN present and providing pt with morning medications including pain medications. Patient alert and initially agreeable to bed level PT session. Patient c/o pain throughout L side and in back/ neck at start of session and is not sure of how much she will be able to do. As RN leaves room, pt's husband arrives and pt's outlook on therapy changes and she is willing to attempt more OOB.   Therapeutic Activity: Bed Mobility: Pt is very specific re: position of bed prior to bed mobility and transfers. Patient performed supine to/from sit with Min A provided from husband and CGA from therapist to reach upright position on EOB. Provided verbal cues for increased effort from pt.  Transfers: Husband encouraged to setup slide board and requires Mod A and 2 attempts for proper  placement under pt's ischial tuberosity. Pt is able to relate proper placement. Patient performed slide board transfer bed --> pwc with Mod A from husband and CGA provided from rehab tech. Supervision from therapist. No vc required as husband provides correct vc to pt re: direction to lean and effort.  Wheelchair Mobility:  Patient propelled wheelchair with slowest speed setting to day room. Pt states she is nervous to go faster because she ran into the shelf in the day room on a faster setting and does not have the confidence for a faster speed. Pt set up with objects placed about day room requiring ability to maneuver into/ out of tight spaces, have good safety awareness in making safe choices for approach to items and needing to turn in place and/ or back out safely. Pt is able to retreive all 7 items placed from knee to near shoulder height while in seated position and all items outside of BOS. Pt has difficulty with 3# soft weighted ball. Given cues for abdominal/ trunk bracing prior to reach for ball with greatly improved technique.   Pt guided in return to room and instructed to maneuver all items in hallway with speed increased to next level. With increased confidence from maneuvering day room with no accidents, pt is able to maneuver all turns and clear obstacles with no issues. Is able to park next to bed, position self, and power down chair.   Neuromuscular Re-ed: NMR facilitated  during session with focus on sitting balance. Pt guided in static/ dynamic sitting activity using bean bags and cornhole board. Initially pt attempts to throw bean bags from front chest. L  armrest raised out of way and instructed pt in arm movement with paired forward lean in order to improve toss and aim. Pt is able to reach to grab bean bags to R side with R hand and toss bags with L hand. Aim and mobility improve with larger trunk and arm movements throughout practice. Pt tolerated activity well. NMR performed for  improvements in motor control and coordination, balance, sequencing, judgement, and self confidence/ efficacy in performing all aspects of mobility at highest level of independence.   Patient seated upright in pwc at end of session with power off, husband present, and all needs within reach at bedside or on tray table placed to her R side.    Therapy Documentation Precautions:  Precautions Precautions: Fall,Back Restrictions Weight Bearing Restrictions: No Other Position/Activity Restrictions: spinal   Therapy/Group: Individual Therapy  Alger Simons PT, DPT 07/26/2020, 2:26 PM

## 2020-07-26 NOTE — Progress Notes (Signed)
Occupational Therapy Session Note  Patient Details  Name: Tonya Myers MRN: [id number] Date of Birth: 12/16/1965  Today's Date: 07/26/2020 OT Individual Time: 0944-1038 OT Individual Time Calculation (min): 54 min    Short Term Goals: Week 4:  OT Short Term Goal 1 (Week 4): patient will complete SB transfers min A OT Short Term Goal 2 (Week 4): patient will complete LB bathing / dressing min A OT Short Term Goal 3 (Week 4): patient will complete grooming and UB bathing/dressing set up level OT Short Term Goal 4 (Week 4): patient will complete toileting mod A  Skilled Therapeutic Interventions/Progress Updates:    Pt received in power w/c with husband present, agreeable to therapy. C/o LLE/hip pain, reports being premedicated prior to session. Session focus on functional mobility and BUE/core strengthening in prep for improved ADL performance. Pt drove her w/c to and from gym with close S, only req min A and increased time to park next to mat table/bed. SB transfer x2 w/c <> table/bed with mod A + 2 to lift and pivot hips. Pt able to recall majority of steps to complete transfer with min VCs. Completed 3x10 of the following with unweighted ball : overhead shoulder press, chest press, core twists, and volleyball punches with 1 lb dowel rod. Req seated rest breaks in between and freq VCs to maintain upright posture. Additionally, req significant A to facilitate B shoulder flexion, only able to achieve to about 90 degrees. Back in room, returned to supine with mod A to progress BLE onto bed. Rolled R and L with min A to pull pants over B hips. Total A to doff B shoes.   Pt left semi-reclined in bed with bed alarm engaged, call bell in reach, husband and MD present, and all immediate needs met.    Therapy Documentation Precautions:  Precautions Precautions: Fall,Back Restrictions Weight Bearing Restrictions: No Other Position/Activity Restrictions: spinal Pain: See session note ADL: See  Care Tool for more details.  Therapy/Group: Individual Therapy   A  MS, OTR/L  07/26/2020, 10:44 AM  

## 2020-07-26 NOTE — Progress Notes (Signed)
PROGRESS NOTE   Subjective/Complaints:  Patient reports she still having a lot of pain on her left lower extremity-she says it "hurts like the devil"-she says it starts in her left low back almost the buttock and radiates down the left leg to the knee.  She also mention she was on gabapentin 400 mg twice a day at home prior to the admission-she would like to restart that gabapentin.  Her last bowel movement was yesterday and she is doing well with that. She says she has poor sleep and has more problems staying asleep than going to sleep.  ROS:  Pt denies SOB, abd pain, CP, N/V/C/D, and vision changes   Objective:   DG Abd 2 Views  Result Date: 07/25/2020 CLINICAL DATA:  Diffuse abdominal pain. EXAM: ABDOMEN - 2 VIEW COMPARISON:  CT 05/26/2015 FINDINGS: Elevation of the right hemidiaphragm. No evidence for free air. Nonobstructive bowel gas pattern with gas in the rectum. No large abdominal calcifications but limited evaluation for renal calculi. Lung bases are clear. No acute bone abnormality. IMPRESSION: No acute abnormality. Electronically Signed   By: Markus Daft M.D.   On: 07/25/2020 16:11   No results for input(s): WBC, HGB, HCT, PLT in the last 72 hours. No results for input(s): NA, K, CL, CO2, GLUCOSE, BUN, CREATININE, CALCIUM in the last 72 hours.  Intake/Output Summary (Last 24 hours) at 07/26/2020 1921 Last data filed at 07/26/2020 1845 Gross per 24 hour  Intake 360 ml  Output --  Net 360 ml        Physical Exam: Vital Signs Blood pressure 107/68, pulse 94, temperature 99.1 F (37.3 C), resp. rate 16, height 6' (1.829 m), weight 99.7 kg, SpO2 100 %.    General: awake, alert, appropriate, husband at bedside; supine in bed;  NAD HENT: conjugate gaze; oropharynx moist CV: regular rate; no JVD Pulmonary: CTA B/L; no W/R/R- good air movement GI: soft, NT, ND, (+)BS Psychiatric: appropriate; interactive;   Neurological: Ox2-3- impulsive Musc: Right knee with edema and tenderness, and is tender to palpation from the left lumbar paraspinals/left upper buttock to the left knee-is consistent with nerve pain Motor: Bilateral upper extremities: 4/5 proximal distal, grip 4-/5, stable Bilateral lower extremities: Hip flexion 2+/5, distally 4+/5, (left stronger than right, some pain inhibition) Bilateral upper extremity ataxia, some improvement  Assessment/Plan: 1. Functional deficits which require 3+ hours per day of interdisciplinary therapy in a comprehensive inpatient rehab setting.  Physiatrist is providing close team supervision and 24 hour management of active medical problems listed below.  Physiatrist and rehab team continue to assess barriers to discharge/monitor patient progress toward functional and medical goals  Care Tool:  Bathing  Bathing activity did not occur: Refused Body parts bathed by patient: Right arm,Left arm,Chest,Abdomen   Body parts bathed by helper: Right lower leg,Buttocks     Bathing assist Assist Level: Set up assist     Upper Body Dressing/Undressing Upper body dressing Upper body dressing/undressing activity did not occur (including orthotics): Refused What is the patient wearing?: Pull over shirt    Upper body assist Assist Level: Minimal Assistance - Patient > 75%    Lower Body Dressing/Undressing Lower body dressing  Lower body dressing activity did not occur: Refused What is the patient wearing?: Pants,Incontinence brief     Lower body assist Assist for lower body dressing: Maximal Assistance - Patient 25 - 49%     Toileting Toileting Toileting Activity did not occur Landscape architect and hygiene only): Refused  Toileting assist Assist for toileting: Maximal Assistance - Patient 25 - 49%     Transfers Chair/bed transfer  Transfers assist     Chair/bed transfer assist level: Moderate Assistance - Patient 50 - 74%      Locomotion Ambulation   Ambulation assist      Assist level: 2 helpers Assistive device: Ethelene Hal Max distance: 36ft   Walk 10 feet activity   Assist  Walk 10 feet activity did not occur: Safety/medical concerns        Walk 50 feet activity   Assist Walk 50 feet with 2 turns activity did not occur: Safety/medical concerns         Walk 150 feet activity   Assist Walk 150 feet activity did not occur: Safety/medical concerns         Walk 10 feet on uneven surface  activity   Assist Walk 10 feet on uneven surfaces activity did not occur: Safety/medical concerns         Wheelchair     Assist Will patient use wheelchair at discharge?: Yes Type of Wheelchair: Manual Wheelchair activity did not occur: Safety/medical concerns  Wheelchair assist level: Supervision/Verbal cueing Max wheelchair distance: 500    Wheelchair 50 feet with 2 turns activity    Assist    Wheelchair 50 feet with 2 turns activity did not occur: Safety/medical concerns   Assist Level: Supervision/Verbal cueing   Wheelchair 150 feet activity     Assist  Wheelchair 150 feet activity did not occur: Safety/medical concerns   Assist Level: Supervision/Verbal cueing   Medical Problem List and Plan: 1.  Decreased functional ability secondary to acute encephalopathy.  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    Con't PT, OT and SLP 2.  Antithrombotics: -DVT/anticoagulation: Continue Lovenox 52.5 mg daily.              -antiplatelet therapy: N/A 3. Pain Management: Oxycodone as needed  Robaxin 500 3 times daily scheduled on 3/12, continue  Right knee extremely tender to touch on 3/28  See #23  Lidoderm patch added to left upper quadrant on  4/1  4/2-added gabapentin 300 mg twice daily-of note her home dose was 400 mg twice a day-  4. Mood: Team support             -antipsychotic agents: N/A 5. Neuropsych: This patient is not capable of making decisions on her own behalf.  Requires consistent positive reinforcement 6. Skin/Wound Care: Routine skin checks 7. Fluids/Electrolytes/Nutrition: Routine in and outs 8.  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    Completed course of Rocephin on 3/30 and follow-up infectious disease.  Repeat blood cultures remain pending 9.  Diabetes mellitus with hyperglycemia/DKA.  Hemoglobin A1c 8.0.  Check blood sugars before meals and at bedtime  CBG (last 3)  Recent Labs    07/26/20 0648 07/26/20 1140 07/26/20 1629  GLUCAP 101* 111* 165*   Lantus insulin 45 units daily, decreased to 40 on 3/16, decreased to 35 on 3/22  Decreased Novolog to 3U TID,  DC'd on 3/29  Increased Metformin to 500mg  BID.   4/2-blood sugars relatively controlled; continue regimen 10.  CLL.  Follow-up outpatient Dr. Delight Hoh 11.  Hypothyroidism.  Synthroid 12.  Anemia of chronic disease.  Multifactorial.   Hemoglobin 9.3 on 3/29  Continue to monitor 13.  Hypertension.  Dilacor 120 mg daily,Micardis 80-25 mg daily           Vitals:   07/26/20 0403 07/26/20 1522  BP: 125/75 107/68  Pulse: 81 94  Resp: 18 16  Temp: 98.8 F (37.1 C) 99.1 F (37.3 C)  SpO2: 100% 100%   4/2-blood pressure controlled; continue regimen 14.  Transaminitis: Resolved.    Resolved on 3/28. 15.  Obesity.  BMI 31.19.  Dietary follow-up 16.  History of asthma.  Continue Singulair 10 mg nightly.   17.  History of genital herpes.  Conitnue Valtrex 18.  Sleep disturbance  Melatonin  ECG reviewed, trazodone 25 nightly started on 3/12  Improving overall 19.  Slow transit constipation  Bowel meds increased on 3/9, increased again on 3/11, decreased on 3/21 to optimize bowel function  Improving  overall  4/2-last bowel movement yesterday; con't regimen 20.  Hypokalemia  Potassium 3.7 on 3/28, labs ordered for Monday  Supplemented x2 days on 3/21 21.  Urinary retention  Flomax increased on 3/11  Continue I/O caths as necessary  Emptying now without significant residual 22. Tachycardia:   Continue Cardizem  Borderline on 3/30 23.  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  Left upper quadrant abdominal pain-appears to be muscular  See #3  X-ray ordered 25. Insomnia  4/2-we will try trazodone for sleep she is on 25 mg; but will increase to 50 mg nightly    LOS: 29 days A FACE TO FACE EVALUATION WAS PERFORMED  Aydien Majette 07/26/2020, 7:21 PM

## 2020-07-27 LAB — CULTURE, BLOOD (ROUTINE X 2)
Culture: NO GROWTH
Culture: NO GROWTH

## 2020-07-27 LAB — GLUCOSE, CAPILLARY
Glucose-Capillary: 101 mg/dL — ABNORMAL HIGH (ref 70–99)
Glucose-Capillary: 170 mg/dL — ABNORMAL HIGH (ref 70–99)
Glucose-Capillary: 152 mg/dL — ABNORMAL HIGH (ref 70–99)
Glucose-Capillary: 130 mg/dL — ABNORMAL HIGH (ref 70–99)

## 2020-07-27 MED ORDER — GABAPENTIN 100 MG PO CAPS
100.0000 mg | ORAL_CAPSULE | Freq: Every morning | ORAL | Status: DC
  Administered 2020-07-28 – 2020-07-31 (×4): 100 mg via ORAL
  Filled 2020-07-27 (×5): qty 1

## 2020-07-27 MED ORDER — GABAPENTIN 100 MG PO CAPS
200.0000 mg | ORAL_CAPSULE | Freq: Every day | ORAL | Status: DC
  Administered 2020-07-27 – 2020-07-30 (×4): 200 mg via ORAL
  Filled 2020-07-27 (×4): qty 2

## 2020-07-27 NOTE — Progress Notes (Signed)
PROGRESS NOTE   Subjective/Complaints:  Patient reports she had the best sleep that she has had since she came to the hospital last night.  She is a little sleepy this morning she feels that is due to the gabapentin that was started. She has asked [place] to reduce the gabapentin due to sedation.  We discussed that and decided to reduce gabapentin to 100 mg in the morning and 200 mg nightly.  Her pain has reduced somewhat from 10 out of 10 to a 7 out of 10.  ROS:   Pt denies SOB, abd pain, CP, N/V/C/D, and vision changes   Objective:   No results found. No results for input(s): WBC, HGB, HCT, PLT in the last 72 hours. No results for input(s): NA, K, CL, CO2, GLUCOSE, BUN, CREATININE, CALCIUM in the last 72 hours.  Intake/Output Summary (Last 24 hours) at 07/27/2020 1816 Last data filed at 07/27/2020 1300 Gross per 24 hour  Intake 840 ml  Output --  Net 840 ml        Physical Exam: Vital Signs Blood pressure (!) 142/80, pulse 90, temperature 98.3 F (36.8 C), temperature source Oral, resp. rate 18, height 6' (1.829 m), weight 99.7 kg, SpO2 99 %.     General: awake, alert, appropriate, husband at bedside; patient a little sleepy;  NAD HENT: conjugate gaze; oropharynx moist CV: regular rate; no JVD Pulmonary: CTA B/L; no W/R/R- good air movement GI: soft, NT, ND, (+)BS Psychiatric: appropriate; interactive Neurological: Ox2-somewhat impulsive  Musc: Right knee with edema and tenderness, and is tender to palpation from the left lumbar paraspinals/left upper buttock to the left knee-is consistent with nerve pain Motor: Bilateral upper extremities: 4/5 proximal distal, grip 4-/5, stable Bilateral lower extremities: Hip flexion 2+/5, distally 4+/5, (left stronger than right, some pain inhibition) Bilateral upper extremity ataxia, some improvement  Assessment/Plan: 1. Functional deficits which require 3+ hours per day of  interdisciplinary therapy in a comprehensive inpatient rehab setting.  Physiatrist is providing close team supervision and 24 hour management of active medical problems listed below.  Physiatrist and rehab team continue to assess barriers to discharge/monitor patient progress toward functional and medical goals  Care Tool:  Bathing  Bathing activity did not occur: Refused Body parts bathed by patient: Right arm,Left arm,Chest,Abdomen   Body parts bathed by helper: Right lower leg,Buttocks     Bathing assist Assist Level: Set up assist     Upper Body Dressing/Undressing Upper body dressing Upper body dressing/undressing activity did not occur (including orthotics): Refused What is the patient wearing?: Pull over shirt    Upper body assist Assist Level: Minimal Assistance - Patient > 75%    Lower Body Dressing/Undressing Lower body dressing    Lower body dressing activity did not occur: Refused What is the patient wearing?: Pants,Incontinence brief     Lower body assist Assist for lower body dressing: Maximal Assistance - Patient 25 - 49%     Toileting Toileting Toileting Activity did not occur Landscape architect and hygiene only): Refused  Toileting assist Assist for toileting: Maximal Assistance - Patient 25 - 49%     Transfers Chair/bed transfer  Transfers assist  Chair/bed transfer assist level: Moderate Assistance - Patient 50 - 74%     Locomotion Ambulation   Ambulation assist      Assist level: 2 helpers Assistive device: Ethelene Hal Max distance: 87ft   Walk 10 feet activity   Assist  Walk 10 feet activity did not occur: Safety/medical concerns        Walk 50 feet activity   Assist Walk 50 feet with 2 turns activity did not occur: Safety/medical concerns         Walk 150 feet activity   Assist Walk 150 feet activity did not occur: Safety/medical concerns         Walk 10 feet on uneven surface  activity   Assist Walk  10 feet on uneven surfaces activity did not occur: Safety/medical concerns         Wheelchair     Assist Will patient use wheelchair at discharge?: Yes Type of Wheelchair: Power Wheelchair activity did not occur: Safety/medical concerns  Wheelchair assist level: Supervision/Verbal cueing Max wheelchair distance: 500    Wheelchair 50 feet with 2 turns activity    Assist    Wheelchair 50 feet with 2 turns activity did not occur: Safety/medical concerns   Assist Level: Supervision/Verbal cueing   Wheelchair 150 feet activity     Assist  Wheelchair 150 feet activity did not occur: Safety/medical concerns   Assist Level: Supervision/Verbal cueing   Medical Problem List and Plan: 1.  Decreased functional ability secondary to acute encephalopathy.  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    Continue PT OT and SLP 2.  Antithrombotics: -DVT/anticoagulation: Continue Lovenox 52.5 mg daily.              -antiplatelet therapy: N/A 3. Pain Management: Oxycodone as needed  Robaxin 500 3 times daily scheduled on 3/12, continue  Right knee extremely tender to touch on 3/28  See #23  Lidoderm patch added to left upper quadrant on 4/1  4/2-added gabapentin 300 mg twice daily-of note her home dose was 400 mg twice a day-   4/3-decrease gabapentin to 100 mg in the morning and 200 mg at night due to her sedation 4. Mood: Team support             -antipsychotic agents: N/A 5. Neuropsych: This patient is not capable of making decisions on her own behalf.  Requires consistent positive reinforcement 6. Skin/Wound Care: Routine skin checks 7. Fluids/Electrolytes/Nutrition: Routine in and outs 8.  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     Completed course of Rocephin on 3/30 and follow-up infectious disease.  Repeat blood cultures remain pending  4/3-repeat blood cultures are negative-that is the final result 9.  Diabetes mellitus with hyperglycemia/DKA.  Hemoglobin A1c 8.0.  Check blood sugars before meals and at bedtime  CBG (last 3)  Recent Labs    07/27/20 0613 07/27/20 1156 07/27/20 1641  GLUCAP 101* 170* 152*   Lantus insulin 45 units daily, decreased to 40 on 3/16, decreased to 35 on 3/22  Decreased Novolog to 3U TID, DC'd on 3/29  Increased Metformin to 500mg  BID.   4/3-blood sugars a little bit more labile today; running from 100-170; continue regimen for now 10.  CLL.  Follow-up outpatient Dr. Delight Hoh 11.  Hypothyroidism.  Synthroid 12.  Anemia of chronic disease.  Multifactorial.  Hemoglobin 9.3 on 3/29  Continue to monitor 13.  Hypertension.  Dilacor 120 mg daily,Micardis 80-25 mg daily           Vitals:   07/27/20 0616 07/27/20 1430  BP: (!) 149/79 (!) 142/80  Pulse: 74 90  Resp: 16 18  Temp: 98.7 F (37.1 C) 98.3 F (36.8 C)  SpO2: 97% 99%   4/3-blood pressure slightly elevated today; however had been running well controlled; we will continue regimen for now 14.  Transaminitis: Resolved.    Resolved on 3/28. 15.  Obesity.  BMI 31.19.  Dietary follow-up 16.  History of asthma.  Continue Singulair 10 mg nightly.   17.  History of genital herpes.  Conitnue Valtrex 18.  Sleep disturbance  Melatonin  ECG reviewed, trazodone 25 nightly started on 3/12  Improving overall 19.  Slow transit constipation  Bowel meds increased on 3/9, increased again on 3/11, decreased on 3/21 to optimize bowel function  Improving overall  4/2-last bowel movement yesterday; con't regimen 20.  Hypokalemia  Potassium 3.7 on 3/28, labs ordered for Monday  Supplemented x2 days on 3/21 21.  Urinary retention  Flomax increased on 3/11  Continue I/O caths as necessary  Emptying now without significant  residual 22. Tachycardia:   Continue Cardizem  Borderline on 3/30 23.  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  Left upper quadrant abdominal pain-appears to be muscular  See #3  X-ray ordered 25. Insomnia  4/2-we will try trazodone for sleep she is on 25 mg; but will increase to 50 mg nightly  4/3-patient says she had the best night of her sleep since she was hospitalized-continue regimen  LOS: 30 days A FACE TO FACE EVALUATION WAS PERFORMED  Tonya Myers 07/27/2020, 6:16 PM

## 2020-07-28 LAB — BASIC METABOLIC PANEL
Anion gap: 9 (ref 5–15)
BUN: 9 mg/dL (ref 6–20)
CO2: 28 mmol/L (ref 22–32)
Calcium: 9 mg/dL (ref 8.9–10.3)
Chloride: 103 mmol/L (ref 98–111)
Creatinine, Ser: 0.71 mg/dL (ref 0.44–1.00)
GFR, Estimated: >60 mL/min (ref >60)
Glucose, Bld: 181 mg/dL — ABNORMAL HIGH (ref 70–99)
Potassium: 3.7 mmol/L (ref 3.5–5.1)
Sodium: 140 mmol/L (ref 135–145)

## 2020-07-28 LAB — GLUCOSE, CAPILLARY
Glucose-Capillary: 92 mg/dL (ref 70–99)
Glucose-Capillary: 166 mg/dL — ABNORMAL HIGH (ref 70–99)
Glucose-Capillary: 141 mg/dL — ABNORMAL HIGH (ref 70–99)
Glucose-Capillary: 161 mg/dL — ABNORMAL HIGH (ref 70–99)

## 2020-07-28 NOTE — Progress Notes (Signed)
Physical Therapy Weekly Progress Note  Patient Details  Name: Tonya Myers MRN: [id number] Date of Birth: 09/27/1965  Beginning of progress report period: July 21, 2020 End of progress report period: July 28, 2020  Today's Date: 07/28/2020 PT Individual Time: 1105-1205 PT Individual Time Calculation (min): 60 min   Patient has met 0 of 1 short term goals.  Patient progressing towards all LTG's, today needing mechanical lift to stand, but CGA in lift for balance up to 2 minutes.  Downgraded standing dynamic balance goal to +2 A, but pt today able to transfer to w/c from mat with CGA after board placement.  Spouse also progressing with education for transfers, and w/c set up.  Patient and spouse need more practice on car transfers.  Hopeful for last adjustments to w/c on tomorrow.  Patient on target for d/c later this week.   Patient continues to demonstrate the following deficits muscle weakness and muscle paralysis and impaired timing and sequencing and decreased coordination and therefore will continue to benefit from skilled PT intervention to increase functional independence with mobility.  Patient progressing toward long term goals except standing balance.  See goal revision..  Continue plan of care.  PT Short Term Goals Week 4:  PT Short Term Goal 1 (Week 4): STG=LTG due to ELOS PT Short Term Goal 1 - Progress (Week 4): Progressing toward goal Week 5:  PT Short Term Goal 1 (Week 5): STG=LTG due to ELOS  Skilled Therapeutic Interventions/Progress Updates:  Patient in supine and reports feeling well.  Assisted to don jacket.  Performed supine to side with S with rail, side to sit with CGA.  Assist to don shoes in sitting max A.  Performed slide board transfer to power w/c with spouse A and cues with CGA.  Patient in w/c propelled with S and cues for maneuvering away from EOB and for maneuvering close to mat in gym x 150'.  Patient performed slide board transfer to mat with min A.  Sit to  stand practice attempted x 2 to EVA walker with +2 A and higher mat height, but pt unable so used [name] for sit to stand and pt stood x 2 minutes with CGA in lift.  Patient sit to supine on mat min A for LE positioning.  Patient attempted clamshell hip abduction on L but unable to lift much so performed hooklying with orange band around knees x 2 x 10 reps, performed bridging x 10, lateral trunk rolls x 10 and hooklying hip flexion with A x 10.  Patient supine to side to sit min A and cues for hand positioning.  Performed lateral leans x 5 to each side with S.  Then seated to reach to retrieve and replace cones on aerobic step with 2 risers in front of her with S to [place] for balance/safety.  Patient transferred mat to w/c with slide board slight downward incline with CGA.  Propelled power chair back to room with S and left up for lunch in chair with spouse in the room and call bell in reach.   Ambulation/gait training;Community reintegration;DME/adaptive equipment instruction;Neuromuscular re-education;Psychosocial support;Stair training;UE/LE Strength taining/ROM;Wheelchair propulsion/positioning;Balance/vestibular training;Discharge planning;Pain management;Therapeutic Activities;UE/LE Coordination activities;Cognitive remediation/compensation;Functional mobility training;Patient/family education;Therapeutic Exercise;Visual/perceptual remediation/compensation   Therapy Documentation Precautions:  Precautions Precautions: Fall,Back Restrictions Weight Bearing Restrictions: No Other Position/Activity Restrictions: spinal Pain: Pain Assessment Pain Scale: 0-10 Pain Score: 9  Faces Pain Scale: Hurts little more Pain Type: Acute pain Pain Location: Generalized Pain Descriptors / Indicators: Discomfort;Spasm Pain Frequency: Constant  Pain Onset: With Activity Patients Stated Pain Goal: 4 Pain Intervention(s): Rest;Repositioned   Therapy/Group: Individual Therapy  Reginia Naas  Magda Kiel,  PT 07/28/2020, 12:38 PM

## 2020-07-28 NOTE — Plan of Care (Signed)
  Problem: Sit to Stand Goal: LTG:  Patient will perform sit to stand with assistance level (PT) Description: LTG:  Patient will perform sit to stand with assistance level (PT) Flowsheets (Taken 07/28/2020 1245) LTG: PT will perform sit to stand in preparation for functional mobility with assistance level: (goal downgraded due to limited progress with R knee pain for a week) 2 Helpers  Goal downgraded due to limited standing with R knee pain. Magda Kiel, PT

## 2020-07-28 NOTE — Progress Notes (Signed)
Speech Language Pathology Discharge Summary  Patient Details  Name: Tonya Myers MRN: [id number] Date of Birth: 07/29/1965  Today's Date: 07/28/2020 SLP Individual Time: 1400-1459 SLP Individual Time Calculation (min): 59 min   Skilled Therapeutic Interventions: Skilled ST services focused on education and cognitive skills. Pt practiced signing name and recorded partial events in complex checkbook balancing task. Pt required x1 cue for error awareness fading to mod I for account balancing utilizing a calculator. SLP provided education pertaining to great progress in higher level cognitive skills including problem solving, alternating attention, anticipatory awareness and recall. Pt had met goals during [name] and is agreeable to discharge. SLP and NT assisted pt into bed via maximove. Pt was left with call bell within reach and bed alarm set.     Patient has met 5 of 5 long term goals.  Patient to discharge at overall Supervision;Modified Independent level.  Reasons goals not met:     Clinical Impression/Discharge Summary:   Pt made great progress meeting 5 out 5 goals, discharging at supervision A to mod I. Pt's goals were upgraded during [name] length of stay due to steady progress in attention, memory, complex problem solving, anticipatory awareness and word finding. Pt completed functional task of medication/money/time management. Pt benefited from skilled ST services in order to maximize functional independence and reduce burden of care, requiring supervision from a physical standpoint at discharge with continued OT ST services if desired, but likely not needed.   Care Partner:  Caregiver Able to Provide Assistance: Yes  Type of Caregiver Assistance: Physical;Cognitive  Recommendation:  Outpatient SLP;24 hour supervision/assistance  Rationale for SLP Follow Up: Maximize cognitive function and independence;Reduce caregiver burden   Equipment: N/A   Reasons for discharge: Treatment goals  met   Patient/Family Agrees with Progress Made and Goals Achieved: Yes    [name] 07/28/2020, 2:47 PM

## 2020-07-28 NOTE — Progress Notes (Signed)
Occupational Therapy Session Note  Patient Details  Name: Tonya Myers MRN: [id number] Date of Birth: 1965-10-01  Today's Date: 07/28/2020 OT Individual Time: 6701-4103 OT Individual Time Calculation (min): 62 min    Short Term Goals: Week 4:  OT Short Term Goal 1 (Week 4): patient will complete SB transfers min A OT Short Term Goal 2 (Week 4): patient will complete LB bathing / dressing min A OT Short Term Goal 3 (Week 4): patient will complete grooming and UB bathing/dressing set up level OT Short Term Goal 4 (Week 4): patient will complete toileting mod A  Skilled Therapeutic Interventions/Progress Updates:    Patient in bed, alert and ready for therapy session, she notes that pain is under control at this time.  Husband, [name], is present and participating in therapy session today.  Reviewed recommendation for LB dressing at bed level due to recent issues with pain and LB weakness.  Patient able to donn pants with min A over hips via rolling.  Rolling in bed with CS.  Side lying to sitting edge of bed with min A.  Husband able to position w/c and SB, completed SB transfer bed to power chair with min cues for back safety and positioning.  Patient able to complete UB bathing/dressing w/c level with min A.  Oral care mod I.  Patient able to propel power chair in room, through doorways and on unit with supervision - good safety and ability to adjust speed appropriately.  Completed UB AROM and AAROM, reviewed HEP - patient and husband demonstrate good understanding.  SB transfer w/c to bed with min A and cues for weight shift.  Sit to supine mod A.  She remained in bed at close of session, bed alarm set and call bell in reach.    Therapy Documentation Precautions:  Precautions Precautions: Fall,Back Restrictions Weight Bearing Restrictions: No Other Position/Activity Restrictions: spinal   Therapy/Group: Individual Therapy  Carlos Levering 07/28/2020, 7:34 AM

## 2020-07-28 NOTE — Progress Notes (Signed)
PROGRESS NOTE   Subjective/Complaints: Patient seen sitting up in bed this AM.  She states she slept well overnight.  She states she had excruciating hip pain over the weekend, which she states has been present for weeks (has never mentioned this before), but it resolved with medications.  She has questions regarding the results of her x-ray.  ROS: Denies CP, SOB, N/V/D  Objective:   No results found. No results for input(s): WBC, HGB, HCT, PLT in the last 72 hours. No results for input(s): NA, K, CL, CO2, GLUCOSE, BUN, CREATININE, CALCIUM in the last 72 hours.  Intake/Output Summary (Last 24 hours) at 07/28/2020 1114 Last data filed at 07/28/2020 0700 Gross per 24 hour  Intake 1410 ml  Output --  Net 1410 ml        Physical Exam: Vital Signs Blood pressure (!) 153/75, pulse 71, temperature 98.5 F (36.9 C), temperature source Oral, resp. rate 18, height 6' (1.829 m), weight 99.7 kg, SpO2 98 %. Constitutional: No distress . Vital signs reviewed. HENT: Normocephalic.  Atraumatic. Eyes: EOMI. No discharge. Cardiovascular: No JVD.  RRR. Respiratory: Normal effort.  No stridor.  Bilateral clear to auscultation. GI: Non-distended.  BS +. Skin: Warm and dry.  Intact. Psych: Normal mood.  Normal behavior. Musc: Right knee with edema and tenderness, significantly improved Neuro: Alert Motor: Bilateral upper extremities: 4/5 proximal distal, grip 4-/5, stable Left lower extremities: Hip flexion 3-/5, knee extension 3 -/5, distally 4+/5, (some pain inhibition) Right lower extremities: Hip flexion 2+/5, knee extension 2/5, distally 4+/5 Bilateral upper extremity ataxia, improving  Assessment/Plan: 1. Functional deficits which require 3+ hours per day of interdisciplinary therapy in a comprehensive inpatient rehab setting.  Physiatrist is providing close team supervision and 24 hour management of active medical problems listed  below.  Physiatrist and rehab team continue to assess barriers to discharge/monitor patient progress toward functional and medical goals  Care Tool:  Bathing  Bathing activity did not occur: Refused Body parts bathed by patient: Right arm,Left arm,Chest,Abdomen   Body parts bathed by helper: Right lower leg,Buttocks     Bathing assist Assist Level: Set up assist     Upper Body Dressing/Undressing Upper body dressing Upper body dressing/undressing activity did not occur (including orthotics): Refused What is the patient wearing?: Pull over shirt    Upper body assist Assist Level: Minimal Assistance - Patient > 75%    Lower Body Dressing/Undressing Lower body dressing    Lower body dressing activity did not occur: Refused What is the patient wearing?: Pants,Incontinence brief     Lower body assist Assist for lower body dressing: Maximal Assistance - Patient 25 - 49%     Toileting Toileting Toileting Activity did not occur Landscape architect and hygiene only): Refused  Toileting assist Assist for toileting: Maximal Assistance - Patient 25 - 49%     Transfers Chair/bed transfer  Transfers assist     Chair/bed transfer assist level: Moderate Assistance - Patient 50 - 74%     Locomotion Ambulation   Ambulation assist      Assist level: 2 helpers Assistive device: Ethelene Hal Max distance: 42ft   Walk 10 feet activity   Assist  Walk 10 feet activity did not occur: Safety/medical concerns        Walk 50 feet activity   Assist Walk 50 feet with 2 turns activity did not occur: Safety/medical concerns         Walk 150 feet activity   Assist Walk 150 feet activity did not occur: Safety/medical concerns         Walk 10 feet on uneven surface  activity   Assist Walk 10 feet on uneven surfaces activity did not occur: Safety/medical concerns         Wheelchair     Assist Will patient use wheelchair at discharge?: Yes Type of  Wheelchair: Power Wheelchair activity did not occur: Safety/medical concerns  Wheelchair assist level: Supervision/Verbal cueing Max wheelchair distance: 500    Wheelchair 50 feet with 2 turns activity    Assist    Wheelchair 50 feet with 2 turns activity did not occur: Safety/medical concerns   Assist Level: Supervision/Verbal cueing   Wheelchair 150 feet activity     Assist  Wheelchair 150 feet activity did not occur: Safety/medical concerns   Assist Level: Supervision/Verbal cueing   Medical Problem List and Plan: 1.  Decreased functional ability secondary to acute encephalopathy.  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    Continue [name] 2.  Antithrombotics: -DVT/anticoagulation: Continue Lovenox 52.5 mg daily.              -antiplatelet therapy: N/A 3. Pain Management: Oxycodone as needed  Robaxin 500 3 times daily scheduled on 3/12, continue  Right knee extremely tender to touch on 3/28  See #23  Lidoderm patch added to left upper quadrant on 4/1  Gabapentin 300 mg twice daily, decreased to 100AM and 200 nightly due to sedation (400 mg twice a day PTA) 4. Mood: Team support             -antipsychotic agents: N/A 5. Neuropsych: This patient is not capable of making decisions on her own behalf.  Requires consistent positive reinforcement 6. Skin/Wound Care: Routine skin checks 7. Fluids/Electrolytes/Nutrition: Routine in and outs 8.  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    Completed course of Rocephin on 3/30 and follow-up infectious disease.  Repeat blood cultures remain pending  4/3-repeat blood cultures are negative. 9.  Diabetes mellitus with hyperglycemia/DKA.  Hemoglobin A1c 8.0.  Check blood sugars before meals  and at bedtime  CBG (last 3)  Recent Labs    07/27/20 1641 07/27/20 2142 07/28/20 0607  GLUCAP 152* 130* 92   Lantus insulin 45 units daily, decreased to 40 on 3/16, decreased to 35 on 3/22  Decreased Novolog to 3U TID, DC'd on 3/29  Increased Metformin to 500mg  BID.   Slightly labile on 4/4 10.  CLL.  Follow-up outpatient Dr. Delight Hoh 11.  Hypothyroidism.  Synthroid 12.  Anemia of chronic disease.  Multifactorial.   Hemoglobin 9.3 on 3/29  Continue to monitor 13.  Hypertension.  Dilacor 120 mg daily,Micardis 80-25 mg daily           Vitals:   07/27/20 2049 07/28/20 0417  BP:  (!) 153/75  Pulse:  71  Resp:  18  Temp: 99.3 F (37.4 C) 98.5 F (36.9 C)  SpO2:  98%   Relatively controlled on 4/4 14.  Transaminitis: Resolved.    Resolved on 3/28.  15.  Obesity.  BMI 31.19.  Dietary follow-up 16.  History of asthma.  Continue Singulair 10 mg nightly.   17.  History of genital herpes.  Conitnue Valtrex 18.  Sleep disturbance  Melatonin  ECG reviewed, trazodone 25 nightly started on 3/12  Improving overall 19.  Slow transit constipation  Bowel meds increased on 3/9, increased again on 3/11, decreased on 3/21 to optimize bowel function  Improving overall 20.  Hypokalemia  Potassium 3.7 on 3/28, labs pending  Supplemented x2 days on 3/21 21.  Urinary retention  Flomax increased on 3/11  Continue I/O caths as necessary  Emptying now without significant residual 22. Tachycardia:   Continue Cardizem  Controlled on 4/4 23.  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  Left upper quadrant abdominal pain  See #3  X-ray unremarkable  Improved 25.  Sleep disturbance  ECG reviewed, trazodone  50 nightly  LOS: 31 days A FACE TO FACE EVALUATION WAS PERFORMED  Tonya Myers 07/28/2020, 11:14 AM

## 2020-07-29 LAB — GLUCOSE, CAPILLARY
Glucose-Capillary: 95 mg/dL (ref 70–99)
Glucose-Capillary: 134 mg/dL — ABNORMAL HIGH (ref 70–99)
Glucose-Capillary: 146 mg/dL — ABNORMAL HIGH (ref 70–99)
Glucose-Capillary: 134 mg/dL — ABNORMAL HIGH (ref 70–99)

## 2020-07-29 MED ORDER — SIMETHICONE 80 MG PO CHEW: 80.0000 mg | CHEWABLE_TABLET | Freq: Four times a day (QID) | ORAL | Status: DC | PRN

## 2020-07-29 NOTE — Progress Notes (Signed)
PROGRESS NOTE   Subjective/Complaints: Patient seen sitting up, working with therapy this morning.  She states she slept well overnight.  Mild left upper quadrant discomfort noted.  Significantly improved knee pain per therapies and patient.  Patient notes constipation.  Family present for teaching.  ROS: + Constipation.  Denies CP, SOB, N/V/D  Objective:   No results found. No results for input(s): WBC, HGB, HCT, PLT in the last 72 hours. Recent Labs    07/28/20 1535  NA 140  K 3.7  CL 103  CO2 28  GLUCOSE 181*  BUN 9  CREATININE 0.71  CALCIUM 9.0    Intake/Output Summary (Last 24 hours) at 07/29/2020 1115 Last data filed at 07/29/2020 0830 Gross per 24 hour  Intake 720 ml  Output --  Net 720 ml        Physical Exam: Vital Signs Blood pressure (!) 148/90, pulse 90, temperature 99.4 F (37.4 C), temperature source Oral, resp. rate 18, height 6' (1.829 m), weight 99.7 kg, SpO2 99 %. Constitutional: No distress . Vital signs reviewed. HENT: Normocephalic.  Atraumatic. Eyes: EOMI. No discharge. Cardiovascular: No JVD.  RRR. Respiratory: Normal effort.  No stridor.  Bilateral clear to auscultation. GI: Non-distended.  BS +. Skin: Warm and dry.  Intact. Psych: Normal mood.  Normal behavior. Musc: Right knee with edema and tenderness, significantly continues to improve Neuro: Alert Motor: Bilateral upper extremities: 4/5 proximal distal, grip 4-/5, stable Left lower extremities: Hip flexion 3-/5, knee extension 3 -/5, distally 4+/5, (some pain inhibition), unchanged Right lower extremities: Hip flexion 2+/5, knee extension 2/5, distally 4+/5, unchanged Bilateral upper extremity ataxia, improving  Assessment/Plan: 1. Functional deficits which require 3+ hours per day of interdisciplinary therapy in a comprehensive inpatient rehab setting.  Physiatrist is providing close team supervision and 24 hour management of  active medical problems listed below.  Physiatrist and rehab team continue to assess barriers to discharge/monitor patient progress toward functional and medical goals  Care Tool:  Bathing  Bathing activity did not occur: Refused Body parts bathed by patient: Right arm,Left arm,Chest,Abdomen   Body parts bathed by helper: Right lower leg,Buttocks     Bathing assist Assist Level: Set up assist     Upper Body Dressing/Undressing Upper body dressing Upper body dressing/undressing activity did not occur (including orthotics): Refused What is the patient wearing?: Pull over shirt    Upper body assist Assist Level: Minimal Assistance - Patient > 75%    Lower Body Dressing/Undressing Lower body dressing    Lower body dressing activity did not occur: Refused What is the patient wearing?: Pants     Lower body assist Assist for lower body dressing: Minimal Assistance - Patient > 75%     Toileting Toileting Toileting Activity did not occur Landscape architect and hygiene only): Refused  Toileting assist Assist for toileting: Maximal Assistance - Patient 25 - 49%     Transfers Chair/bed transfer  Transfers assist     Chair/bed transfer assist level: Minimal Assistance - Patient > 75%     Locomotion Ambulation   Ambulation assist      Assist level: 2 helpers Assistive device: Ethelene Hal Max distance: 45ft   Walk  10 feet activity   Assist  Walk 10 feet activity did not occur: Safety/medical concerns        Walk 50 feet activity   Assist Walk 50 feet with 2 turns activity did not occur: Safety/medical concerns         Walk 150 feet activity   Assist Walk 150 feet activity did not occur: Safety/medical concerns         Walk 10 feet on uneven surface  activity   Assist Walk 10 feet on uneven surfaces activity did not occur: Safety/medical concerns         Wheelchair     Assist Will patient use wheelchair at discharge?: Yes Type of  Wheelchair: Power Wheelchair activity did not occur: Safety/medical concerns  Wheelchair assist level: Supervision/Verbal cueing Max wheelchair distance: 140    Wheelchair 50 feet with 2 turns activity    Assist    Wheelchair 50 feet with 2 turns activity did not occur: Safety/medical concerns   Assist Level: Supervision/Verbal cueing   Wheelchair 150 feet activity     Assist  Wheelchair 150 feet activity did not occur: Safety/medical concerns   Assist Level: Supervision/Verbal cueing   Medical Problem List and Plan: 1.  Decreased functional ability secondary to acute encephalopathy.  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    Continue [name], patient and family education. 2.  Antithrombotics: -DVT/anticoagulation: Continue Lovenox 52.5 mg daily.              -antiplatelet therapy: N/A 3. Pain Management: Oxycodone as needed  Robaxin 500 3 times daily scheduled on 3/12, continue  Right knee extremely tender to touch on 3/28  See #23  Lidoderm patch added to left upper quadrant on 4/1  Gabapentin 300 mg twice daily, decreased to 100AM and 200 nightly due to sedation (400 mg twice a day PTA)  Controlled on 4/5 4. Mood: Team support             -antipsychotic agents: N/A 5. Neuropsych: This patient is not capable of making decisions on her own behalf.  Requires consistent positive reinforcement 6. Skin/Wound Care: Routine skin checks 7. Fluids/Electrolytes/Nutrition: Routine in and outs 8.  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    Completed course of Rocephin on 3/30 and follow-up infectious disease.  Repeat blood cultures no growth. 9.  Diabetes mellitus with hyperglycemia/DKA.  Hemoglobin A1c 8.0.  Check blood sugars before  meals and at bedtime  CBG (last 3)  Recent Labs    07/28/20 1642 07/28/20 2042 07/29/20 0601  GLUCAP 141* 161* 95   Lantus insulin 45 units daily, decreased to 40 on 3/16, decreased to 35 on 3/22  Decreased Novolog to 3U TID, DC'd on 3/29  Increased Metformin to 500mg  BID.   Slightly labile on 4/5 10.  CLL.  Follow-up outpatient Dr. Delight Hoh 11.  Hypothyroidism.  Synthroid 12.  Anemia of chronic disease.  Multifactorial.   Hemoglobin 9.3 on 3/29  Continue to monitor 13.  Hypertension.  Dilacor 120 mg daily,Micardis 80-25 mg daily           Vitals:   07/28/20 2039 07/29/20 0446  BP: 132/77 (!) 148/90  Pulse: 86 90  Resp: 18 18  Temp: 99.4 F (37.4 C) 99.4 F (37.4 C)  SpO2: 100% 99%   Slightly labile on 4/5 14.  Transaminitis: Resolved.    Resolved on 3/28. 15.  Obesity.  BMI 31.19.  Dietary follow-up 16.  History of asthma.  Continue Singulair 10 mg nightly.   17.  History of genital herpes.  Conitnue Valtrex 18.  Sleep disturbance  Melatonin  ECG reviewed, trazodone 25 nightly started on 3/12  Improving 19.  Slow transit constipation  Bowel meds increased on 3/9, increased again on 3/11, decreased on 3/21 to optimize bowel function, suppository as needed 20.  Hypokalemia  Potassium 3.7 on 3/28, labs pending  Supplemented x2 days on 3/21 21.  Urinary retention  Flomax increased on 3/11  Continue I/O caths as necessary  Emptying now without significant residual 22. Tachycardia:   Continue Cardizem  Controlled on 4/4 23.  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  Left upper quadrant abdominal pain  See #3  X-ray unremarkable  Simethicone as needed ordered on 4/5 25.  Sleep  disturbance  ECG reviewed, trazodone 50 nightly  LOS: 32 days A FACE TO FACE EVALUATION WAS PERFORMED  Dura Mccormack Lorie Phenix 07/29/2020, 11:15 AM

## 2020-07-29 NOTE — Plan of Care (Signed)
  Problem: Clinical Measurements: Goal: Will remain free from infection Outcome: Progressing   Problem: Activity: Goal: Risk for activity intolerance will decrease Outcome: Progressing   Problem: Coping: Goal: Level of anxiety will decrease Outcome: Progressing   Problem: Elimination: Goal: Will not experience complications related to bowel motility Outcome: Progressing Goal: Will not experience complications related to urinary retention Outcome: Progressing   Problem: Pain Managment: Goal: General experience of comfort will improve Outcome: Progressing   Problem: Safety: Goal: Ability to remain free from injury will improve Outcome: Progressing   Problem: Skin Integrity: Goal: Risk for impaired skin integrity will decrease Outcome: Progressing   Problem: Consults Goal: RH GENERAL PATIENT EDUCATION Description: See Patient Education module for education specifics. Outcome: Progressing   Problem: RH BOWEL ELIMINATION Goal: RH STG MANAGE BOWEL WITH ASSISTANCE Description: STG Manage Bowel with min Assistance. Outcome: Progressing   Problem: RH BLADDER ELIMINATION Goal: RH STG MANAGE BLADDER WITH ASSISTANCE Description: STG Manage Bladder With min Assistance Outcome: Progressing   Problem: RH SKIN INTEGRITY Goal: RH STG SKIN FREE OF INFECTION/BREAKDOWN Description: No new skin breakdown this admission  Outcome: Progressing Goal: RH STG MAINTAIN SKIN INTEGRITY WITH ASSISTANCE Description: STG Maintain Skin Integrity With min Assistance. Outcome: Progressing Goal: RH STG ABLE TO PERFORM INCISION/WOUND CARE W/ASSISTANCE Description: STG Able To Perform Incision/Wound Care With max Assistance. Outcome: Progressing   Problem: RH SAFETY Goal: RH STG ADHERE TO SAFETY PRECAUTIONS W/ASSISTANCE/DEVICE Description: STG Adhere to Safety Precautions With min Assistance/Device. Outcome: Progressing   Problem: RH PAIN MANAGEMENT Goal: RH STG PAIN MANAGED AT OR BELOW PT'S  PAIN GOAL Description: Less than 3 out of 10 Outcome: Progressing   Problem: RH KNOWLEDGE DEFICIT GENERAL Goal: RH STG INCREASE KNOWLEDGE OF SELF CARE AFTER HOSPITALIZATION Description: Min assist Outcome: Progressing

## 2020-07-29 NOTE — Progress Notes (Signed)
Occupational Therapy Session Note  Patient Details  Name: Tonya Myers MRN: [id number] Date of Birth: 01/07/1966  Today's Date: 07/29/2020 OT Individual Time: 0800-0930 OT Individual Time Calculation (min): 90 min    Short Term Goals: Week 4:  OT Short Term Goal 1 (Week 4): patient will complete SB transfers min A OT Short Term Goal 2 (Week 4): patient will complete LB bathing / dressing min A OT Short Term Goal 3 (Week 4): patient will complete grooming and UB bathing/dressing set up level OT Short Term Goal 4 (Week 4): patient will complete toileting mod A  Skilled Therapeutic Interventions/Progress Updates:    Patient in bed, alert and ready for therapy - she denies pain at this time.  Cousin present for therapy session.  Supine to sitting edge of bed with CS.  SB transfer bed to shower commode chair min A.  Able to remove brief with lateral lean min A.  Continent of bowel.   Wheeled to shower - she was able to complete shower w/c level with min A for bilateral lower legs in this position and buttocks.  donns [place] shirt seated on shower seat with set up.  SB transfer back to bed min/mod A due to wet surface.  Sit to supine mod A.  Able to roll in bed to wipe buttocks, mod A to place clean brief.   She is able to donn pants with min A to pull over hips.   Returned to sitting edge of bed for oral care with set up.  Completed box and blocks in unsupported sitting R = 39, L = 40.  Reviewed discharge adl, toileting and transfer recommendations with patient and husband - they both verbalize understanding.  Reviewed energy conservation and safety.  Patient returned to lying in bed at close of session.  Bed alarm set and call bell in hand.    Therapy Documentation Precautions:  Precautions Precautions: Fall,Back Restrictions Weight Bearing Restrictions: No Other Position/Activity Restrictions: spinal   Therapy/Group: Individual Therapy  Carlos Levering 07/29/2020, 7:31 AM

## 2020-07-29 NOTE — Progress Notes (Signed)
Patient ID: Tonya Myers, female   DOB: October 13, 1965, 55 y.o.   MRN: [id number]  Met with pt to answer her questions regarding hospital bed at home. She feels ready for discharge Thursday but somewhat anxious to get a routine at home going. Will have all equipment and home health agency aware of discharge date. Feels better about her transfers and her progress has a whole she feels the extra week made a huge difference and her knee is so much better now.

## 2020-07-29 NOTE — Progress Notes (Signed)
Occupational Therapy Discharge Summary  Patient Details  Name: Tonya Myers MRN: [id number] Date of Birth: 1965/10/22     Patient has met 8 of 8 long term goals due to improved activity tolerance, improved balance, postural control, ability to compensate for deficits, functional use of  RIGHT upper, RIGHT lower, LEFT upper and LEFT lower extremity, improved attention, improved awareness and improved coordination.  Patient to discharge at [facility] Assist level.  Patient's care partner is independent to provide the necessary physical assistance at discharge.    Reasons goals not met: na  Recommendation:  Patient will benefit from ongoing skilled OT services in home health setting to continue to advance functional skills in the area of BADL, iADL and Reduce care partner burden.  Equipment: hospital bed, transfer board             family purchased bariatric drop arm commode, plans to sponge bathe at home as full bath on second floor  Reasons for discharge: treatment goals met  Patient/family agrees with progress made and goals achieved: Yes  OT Discharge Precautions/Restrictions  Precautions Precautions: Fall;Back ADL ADL Eating: Set up Where Assessed-Eating: Wheelchair Grooming: Setup Where Assessed-Grooming: Sitting at sink Upper Body Bathing: Setup Where Assessed-Upper Body Bathing: Sitting at sink Lower Body Bathing: Setup Where Assessed-Lower Body Bathing: Bed level Upper Body Dressing: Supervision/safety Where Assessed-Upper Body Dressing: Sitting at sink Lower Body Dressing: Minimal assistance Where Assessed-Lower Body Dressing: Bed level Toileting: Minimal assistance Where Assessed-Toileting: Bed level (reviewed and practiced strategies for toileting on commode - needs assistance for hygiene while seated on commode and completes clothing management once back at bed level as lateral leans continue to be difficult to manage.) Toilet Transfer: Minimal  assistance Toilet Transfer Method: Transfer board Toilet Transfer Equipment: Extra wide drop arm bedside commode ADL Comments: Unable to assess any ADL areas due to pts unwillingness to participate/agitation and aggressive behaviors Vision Baseline Vision/History: Wears glasses Patient Visual Report: No change from baseline Vision Assessment?: No apparent visual deficits Perception  Perception: Within Functional Limits Praxis Praxis: Intact Cognition Overall Cognitive Status: Within Functional Limits for tasks assessed Arousal/Alertness: Awake/alert Orientation Level: Oriented X4 Safety/Judgment: Appears intact Sensation Coordination Gross Motor Movements are Fluid and Coordinated: No Fine Motor Movements are Fluid and Coordinated: No Coordination and Movement Description: proximal weakness, bilateral hand impairment Finger Nose Finger Test: mild dysmetria 9 Hole Peg Test: box and blocks:  R = 39, L = 40 Motor  Motor Motor - Discharge Observations: improving posture and strength t/o Mobility  Bed Mobility Bed Mobility: Sit to Sidelying Right Rolling Right: Independent with assistive device Rolling Left: Independent with assistive device Right Sidelying to Sit: Supervision/Verbal cueing Sit to Sidelying Right: Minimal Assistance - Patient > 75%  Trunk/Postural Assessment  Postural Control Trunk Control: able to achieve upright posture - continues to fatigue with activity  Balance Static Sitting Balance Static Sitting - Level of Assistance: 5: Stand by assistance Dynamic Sitting Balance Dynamic Sitting - Level of Assistance: 5: Stand by assistance Extremity/Trunk Assessment RUE Assessment Passive Range of Motion (PROM) Comments: WFL Active Range of Motion (AROM) Comments: shoulder 3/4 to full, distal full t/o General Strength Comments: shoulder 3/5, distal 4/5 LUE Assessment Passive Range of Motion (PROM) Comments: WFL Active Range of Motion (AROM) Comments: shoulder  3/4 to full, distal [facility] General Strength Comments: shoulder 3/5, distal 4/5   Stacey A Kinter 07/29/2020, 4:28 PM

## 2020-07-29 NOTE — Progress Notes (Signed)
Physical Therapy Session Note  Patient Details  Name: Tonya Myers MRN: [id number] Date of Birth: 1966-03-06  Today's Date: 07/29/2020 PT Individual Time: 1500-1600 PT Individual Time Calculation (min): 60 min   Short Term Goals: Week 5:  PT Short Term Goal 1 (Week 5): STG=LTG due to ELOS  Skilled Therapeutic Interventions/Progress Updates:    Patient in supine with aunt in the room. Patient performed rolling to R with rail and A for L knee flexion, side to sit min A with rail and increased time.  Patient performed slide board transfer to power w/c with min A after board placement.  Patient able to back up in room and manage in smaller space with S.  Patient propelled x 200' with S and performed slide board transfer to sedan height car simulator with mod A to enter car and +2 A to return to power chair due to elevated height.  Performed seated balance activity at BITS with feet on floor on edge of seat with min A to tap numbers and letters over screen pt reaching with R vs. L UE and occasional min A for balance or elevating one arm.  Performed 2 minutes, then 1 minute.  Patient propelled to room with S and transferred to bed with CGA with slide board.  Sit to supine mod A for LE's.  Left in supine with family present and needs in reach.  Therapy Documentation Precautions:  Precautions Precautions: Fall,Back Restrictions Weight Bearing Restrictions: Yes Other Position/Activity Restrictions: spinal Pain: Pain Assessment Faces Pain Scale: Hurts little more Pain Type: Acute pain Pain Location: Generalized Pain Descriptors / Indicators: Aching Pain Onset: On-going Pain Intervention(s): Repositioned   Therapy/Group: Individual Therapy  Reginia Naas  [place], [place] 07/29/2020, 6:09 PM

## 2020-07-30 LAB — GLUCOSE, CAPILLARY
Glucose-Capillary: 100 mg/dL — ABNORMAL HIGH (ref 70–99)
Glucose-Capillary: 174 mg/dL — ABNORMAL HIGH (ref 70–99)
Glucose-Capillary: 167 mg/dL — ABNORMAL HIGH (ref 70–99)
Glucose-Capillary: 151 mg/dL — ABNORMAL HIGH (ref 70–99)

## 2020-07-30 MED ORDER — METFORMIN HCL ER 500 MG PO TB24: 500.0000 mg | ORAL_TABLET | Freq: Two times a day (BID) | ORAL | 0 refills | Status: DC

## 2020-07-30 MED ORDER — MAGNESIUM OXIDE 400 (241.3 MG) MG PO TABS: 400.0000 mg | ORAL_TABLET | Freq: Two times a day (BID) | ORAL | 0 refills | Status: DC

## 2020-07-30 MED ORDER — VALACYCLOVIR HCL 1 G PO TABS: 1000.0000 mg | ORAL_TABLET | Freq: Two times a day (BID) | ORAL | 1 refills | Status: DC

## 2020-07-30 MED ORDER — MONTELUKAST SODIUM 10 MG PO TABS: 10.0000 mg | ORAL_TABLET | Freq: Every day | ORAL | 0 refills | Status: DC

## 2020-07-30 MED ORDER — QUETIAPINE FUMARATE 25 MG PO TABS: 25.0000 mg | ORAL_TABLET | Freq: Two times a day (BID) | ORAL | 0 refills | Status: DC

## 2020-07-30 MED ORDER — GABAPENTIN 100 MG PO CAPS: ORAL_CAPSULE | ORAL | 1 refills | Status: DC

## 2020-07-30 MED ORDER — LEVOTHYROXINE SODIUM 150 MCG PO TABS: 150.0000 ug | ORAL_TABLET | Freq: Every day | ORAL | 0 refills | Status: DC

## 2020-07-30 MED ORDER — IRBESARTAN 75 MG PO TABS: 75.0000 mg | ORAL_TABLET | Freq: Every day | ORAL | 0 refills | Status: DC

## 2020-07-30 MED ORDER — TRAMADOL HCL 50 MG PO TABS: 50.0000 mg | ORAL_TABLET | Freq: Four times a day (QID) | ORAL | 0 refills | Status: DC | PRN

## 2020-07-30 MED ORDER — DICLOFENAC SODIUM 1 % EX GEL: 2.0000 g | Freq: Four times a day (QID) | CUTANEOUS | 0 refills | Status: DC

## 2020-07-30 MED ORDER — POLYETHYLENE GLYCOL 3350 17 G PO PACK: 17.0000 g | PACK | Freq: Every day | ORAL | 0 refills | Status: DC

## 2020-07-30 MED ORDER — DILTIAZEM HCL ER 120 MG PO CP24: 120.0000 mg | ORAL_CAPSULE | Freq: Every day | ORAL | 0 refills | Status: DC

## 2020-07-30 MED ORDER — METHOCARBAMOL 500 MG PO TABS: 500.0000 mg | ORAL_TABLET | Freq: Three times a day (TID) | ORAL | 0 refills | Status: DC

## 2020-07-30 MED ORDER — ACETAMINOPHEN 325 MG PO TABS: 650.0000 mg | ORAL_TABLET | Freq: Four times a day (QID) | ORAL | Status: AC | PRN

## 2020-07-30 MED ORDER — TAMSULOSIN HCL 0.4 MG PO CAPS: 0.8000 mg | ORAL_CAPSULE | Freq: Every day | ORAL | 0 refills | Status: DC

## 2020-07-30 MED ORDER — INSULIN GLARGINE 100 UNIT/ML SOLOSTAR PEN: 35.0000 [IU] | PEN_INJECTOR | Freq: Every day | SUBCUTANEOUS | 11 refills | Status: DC

## 2020-07-30 MED ORDER — ROSUVASTATIN CALCIUM 10 MG PO TABS: 10.0000 mg | ORAL_TABLET | Freq: Every day | ORAL | 0 refills | Status: DC

## 2020-07-30 MED ORDER — COLCHICINE 0.6 MG PO TABS: 0.6000 mg | ORAL_TABLET | Freq: Every day | ORAL | 0 refills | Status: DC

## 2020-07-30 MED ORDER — MELATONIN 5 MG PO TABS: 5.0000 mg | ORAL_TABLET | Freq: Every day | ORAL | 0 refills | Status: DC

## 2020-07-30 MED ORDER — TRAZODONE HCL 50 MG PO TABS: 50.0000 mg | ORAL_TABLET | Freq: Every day | ORAL | 0 refills | Status: DC

## 2020-07-30 MED ORDER — PANTOPRAZOLE SODIUM 40 MG PO TBEC: 40.0000 mg | DELAYED_RELEASE_TABLET | Freq: Every day | ORAL | 0 refills | Status: DC

## 2020-07-30 MED ORDER — EZETIMIBE 10 MG PO TABS: 10.0000 mg | ORAL_TABLET | Freq: Every day | ORAL | 0 refills | Status: DC

## 2020-07-30 NOTE — Progress Notes (Signed)
Patient ID: Tonya Myers, female   DOB: November 15, 1965, 55 y.o.   MRN: [id number]  Met with pt and [name] to inform of team conference both feel prepared for discharge. Pt would like to leave around 10:00 tomorrow will schedule ambulance. [facility] aware of discharge date.

## 2020-07-30 NOTE — Discharge Instructions (Signed)
Inpatient Rehab Discharge Instructions  Tonya Myers Discharge date and time: No discharge date for patient encounter.   Activities/Precautions/ Functional Status: Activity: activity as tolerated Diet: diabetic diet Wound Care: Routine skin checks Functional status:  ___ No restrictions     ___ Walk up steps independently ___ 24/7 supervision/assistance   ___ Walk up steps with assistance ___ Intermittent supervision/assistance  ___ Bathe/dress independently ___ Walk with walker     _x__ Bathe/dress with assistance ___ Walk Independently    ___ Shower independently ___ Walk with assistance    ___ Shower with assistance ___ No alcohol     ___ Return to work/school ________  Special Instructions: No driving smoking or alcohol      COMMUNITY REFERRALS UPON DISCHARGE:    Home Health:   PT, OT, SP, [place] Equipment/Items Ordered:WHEELCHAIR AND 30 TRANSFER BOARD-STALL'S  [place]  [phone]  TRANSPORTATION: [place]  [phone]                                                 My questions have been answered and I understand these instructions. I will adhere to these goals and the provided educational materials after my discharge from the hospital.  Patient/Caregiver Signature _______________________________ Date __________  Clinician Signature _______________________________________ Date __________  Please bring this form and your medication list with you to all your follow-up doctor's appointments.

## 2020-07-30 NOTE — Progress Notes (Signed)
Physical Therapy Discharge Summary  Patient Details  Name: Tonya Myers MRN: [id number] Date of Birth: 1965-07-11  Today's Date: 07/30/2020  Patient has met 6 of 8 long term goals due to improved activity tolerance, improved balance, improved postural control, increased strength and ability to compensate for deficits.  Patient to discharge at a wheelchair level [place].   Patient's care partner is independent to provide the necessary physical assistance at discharge.  Reasons goals not met: Patient able to perform bed mobility at CGA level at times, but not consistently due to increased assist with fatigue.  Also not able to perform car transfers with mod A unless level surface and her vehicles are not level with her w/c, therefore have arranged ambulance transport home.   Recommendation:  Patient will benefit from ongoing skilled PT services in home health setting to continue to advance safe functional mobility, address ongoing impairments in strength, balance, endurance, and minimize fall risk.  Equipment: power w/c, sliding board  Reasons for discharge: discharge from hospital  Patient/family agrees with progress made and goals achieved: Yes  PT Discharge Precautions/Restrictions Precautions Precautions: Fall;Back Vital Signs  Pain Pain Assessment Pain Scale: 0-10 Pain Score: 6  Pain Type: Chronic pain Pain Location: Hip Pain Orientation: Left Pain Descriptors / Indicators: Aching Pain Frequency: Constant Pain Onset: On-going Patients Stated Pain Goal: 2 Pain Intervention(s): Medication (See eMAR);Repositioned;Heat applied Vision/Perception  Perception Perception: Within Functional Limits Praxis Praxis: Intact  Cognition Overall Cognitive Status: Within Functional Limits for tasks assessed Arousal/Alertness: Awake/alert Orientation Level: Oriented X4 Safety/Judgment: Appears intact Sensation Sensation Light Touch: Impaired Detail Peripheral sensation  comments: decreased to light touch in feet more compared to lower legs and decreased L thigh compared to R thigh, but can feel touch, just decreased. Light Touch Impaired Details: Impaired RLE;Impaired LLE Coordination Gross Motor Movements are Fluid and Coordinated: No Fine Motor Movements are Fluid and Coordinated: No Coordination and Movement Description: proximal weakness, bilateral hand impairment Finger Nose Finger Test: mild dysmetria Heel Shin Test: unable due to weakness Motor  Motor Motor: Abnormal postural alignment and control Motor - Discharge Observations: still with forward head, rounded shoulders and weak trunk, but improving  Mobility Bed Mobility Bed Mobility: Rolling Right;Right Sidelying to Sit Rolling Right: Minimal Assistance - Patient > 75% Right Sidelying to Sit: Minimal Assistance - Patient > 75% Sit to Sidelying Right: Minimal Assistance - Patient > 75% Transfers Transfers: Lateral/Scoot Transfers Sit to Stand: Dependent - mechanical lift Sit to Stand Comment: [name] Lateral/Scoot Transfers: Minimal Assistance - Patient > 75% Transfer (Assistive device): Other (Comment) (transfer board) Locomotion  Gait Ambulation: No Gait Gait: No Wheelchair Mobility Wheelchair Mobility: Yes Wheelchair Assistance: Chartered loss adjuster: Power Wheelchair Parts Management: Needs assistance Distance: 200'  Trunk/Postural Assessment  Cervical Assessment Cervical Assessment: Exceptions to [name] (forward head) Thoracic Assessment Thoracic Assessment: Exceptions to [facility] (rounded shoulders) Lumbar Assessment Lumbar Assessment: Exceptions to [facility] (posterior pelvic tilt) Postural Control Postural Control: Deficits on evaluation Trunk Control: able to achieve upright posture - continues to fatigue with activity  Balance Balance Balance Assessed: Yes Static Sitting Balance Static Sitting - Level of Assistance: 5: Stand by assistance Dynamic  Sitting Balance Dynamic Sitting - Level of Assistance: 5: Stand by assistance Static Standing Balance Static Standing - Balance Support: Right upper extremity supported;Left upper extremity supported Static Standing - Level of Assistance: 1: +1 Total assist Extremity Assessment      RLE Assessment RLE Assessment: Exceptions to [facility] Passive Range of Motion (PROM) Comments:  wfl Active Range of Motion (AROM) Comments: limited by weakness General Strength Comments: hip flexion 2/5, abduction 1+/5, adduction 2/5, knee flexion 3-/5, extension 3-/5, ankle DF 4-/5 LLE Assessment LLE Assessment: Exceptions to [facility] Passive Range of Motion (PROM) Comments: WFL Active Range of Motion (AROM) Comments: limited by weakness General Strength Comments: hip flexion 2/5, abduction 1+/5, adduction 2/5, knee flexion 3-/5, extension 3-/5, ankle DF 4-/5    Reginia Naas  Magda Kiel, PT 07/30/2020, 12:15 PM

## 2020-07-30 NOTE — Progress Notes (Signed)
PROGRESS NOTE   Subjective/Complaints: Patient seen sitting up in bed this morning.  She states she slept well overnight.  Family at bedside.  She has questions regarding neuropathy  ROS: Denies CP, SOB, N/V/D  Objective:   No results found. No results for input(s): WBC, HGB, HCT, PLT in the last 72 hours. Recent Labs    07/28/20 1535  NA 140  K 3.7  CL 103  CO2 28  GLUCOSE 181*  BUN 9  CREATININE 0.71  CALCIUM 9.0    Intake/Output Summary (Last 24 hours) at 07/30/2020 1024 Last data filed at 07/30/2020 0910 Gross per 24 hour  Intake 600 ml  Output --  Net 600 ml        Physical Exam: Vital Signs Blood pressure (!) 152/83, pulse 81, temperature 99.3 F (37.4 C), temperature source Oral, resp. rate 16, height 6' (1.829 m), weight 99.7 kg, SpO2 99 %. Constitutional: No distress . Vital signs reviewed. HENT: Normocephalic.  Atraumatic. Eyes: EOMI. No discharge. Cardiovascular: No JVD.  RRR. Respiratory: Normal effort.  No stridor.  Bilateral clear to auscultation. GI: Non-distended.  BS +. Skin: Warm and dry.  Intact. Psych: Normal mood.  Normal behavior. Musc: Right knee with edema and tenderness, significantly continues to improve Neuro: Alert Motor: Bilateral upper extremities: 4/5 proximal distal, grip 4-/5, stable Left lower extremities: Hip flexion 3 +/5, knee extension 4-/5, distally 4+/5 Right lower extremities: Hip flexion 2+/5, knee extension 2/5, distally 4+/5, unchanged Bilateral upper extremity ataxia, improving  Assessment/Plan: 1. Functional deficits which require 3+ hours per day of interdisciplinary therapy in a comprehensive inpatient rehab setting.  Physiatrist is providing close team supervision and 24 hour management of active medical problems listed below.  Physiatrist and rehab team continue to assess barriers to discharge/monitor patient progress toward functional and medical  goals  Care Tool:  Bathing  Bathing activity did not occur: Refused Body parts bathed by patient: Right arm,Left arm,Chest,Abdomen,Front perineal area,Buttocks,Right upper leg,Left upper leg,Right lower leg,Left lower leg,Face   Body parts bathed by helper: Buttocks     Bathing assist Assist Level: Minimal Assistance - Patient > 75%     Upper Body Dressing/Undressing Upper body dressing Upper body dressing/undressing activity did not occur (including orthotics): Refused What is the patient wearing?: Pull over shirt    Upper body assist Assist Level: Supervision/Verbal cueing    Lower Body Dressing/Undressing Lower body dressing    Lower body dressing activity did not occur: Refused What is the patient wearing?: Pants,Incontinence brief     Lower body assist Assist for lower body dressing: Minimal Assistance - Patient > 75%     Toileting Toileting Toileting Activity did not occur Landscape architect and hygiene only): Refused  Toileting assist Assist for toileting: Moderate Assistance - Patient 50 - 74%     Transfers Chair/bed transfer  Transfers assist     Chair/bed transfer assist level: Minimal Assistance - Patient > 75%     Locomotion Ambulation   Ambulation assist      Assist level: 2 helpers Assistive device: Ethelene Hal Max distance: 28ft   Walk 10 feet activity   Assist  Walk 10 feet activity did not occur: Safety/medical  concerns        Walk 50 feet activity   Assist Walk 50 feet with 2 turns activity did not occur: Safety/medical concerns         Walk 150 feet activity   Assist Walk 150 feet activity did not occur: Safety/medical concerns         Walk 10 feet on uneven surface  activity   Assist Walk 10 feet on uneven surfaces activity did not occur: Safety/medical concerns         Wheelchair     Assist Will patient use wheelchair at discharge?: Yes Type of Wheelchair: Power Wheelchair activity did not occur:  Safety/medical concerns  Wheelchair assist level: Supervision/Verbal cueing Max wheelchair distance: 140    Wheelchair 50 feet with 2 turns activity    Assist    Wheelchair 50 feet with 2 turns activity did not occur: Safety/medical concerns   Assist Level: Supervision/Verbal cueing   Wheelchair 150 feet activity     Assist  Wheelchair 150 feet activity did not occur: Safety/medical concerns   Assist Level: Supervision/Verbal cueing   Medical Problem List and Plan: 1.  Decreased functional ability secondary to acute encephalopathy.  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    Continue [name], patient and family education  Team conference today to discuss current and goals and coordination of care, home and environmental barriers, and discharge planning with nursing, case manager, and therapies. Please see conference note from today as well.  2.  Antithrombotics: -DVT/anticoagulation: Continue Lovenox 52.5 mg daily.              -antiplatelet therapy: N/A 3. Pain Management: Oxycodone as needed  Robaxin 500 3 times daily scheduled on 3/12, continue  Right knee extremely tender to touch on 3/28  See #23  Lidoderm patch added to left upper quadrant on 4/1  Gabapentin 300 mg twice daily, decreased to 100AM and 200 nightly due to sedation (400 mg twice a day PTA)  Controlled with meds on 4/6 4. Mood: Team support             -antipsychotic agents: N/A 5. Neuropsych: This patient is not capable of making decisions on her own behalf.  Requires consistent positive reinforcement 6. Skin/Wound Care: Routine skin checks 7. Fluids/Electrolytes/Nutrition: Routine in and outs 8.  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    Completed course of  Rocephin on 3/30 and follow-up infectious disease.  Repeat blood cultures no growth. 9.  Diabetes mellitus with hyperglycemia/DKA.  Hemoglobin A1c 8.0.  Check blood sugars before meals and at bedtime  CBG (last 3)  Recent Labs    07/29/20 1657 07/29/20 2119 07/30/20 0631  GLUCAP 146* 134* 100*   Lantus insulin 45 units daily, decreased to 40 on 3/16, decreased to 35 on 3/22  Decreased Novolog to 3U TID, DC'd on 3/29  Increased Metformin to 500mg  BID.   Relatively controlled on 4/6 10.  CLL.  Follow-up outpatient Dr. Delight Hoh 11.  Hypothyroidism.  Synthroid 12.  Anemia of chronic disease.  Multifactorial.   Hemoglobin 9.3 on 3/29  Continue to monitor 13.  Hypertension.  Dilacor 120 mg daily,Micardis 80-25 mg daily           Vitals:   07/29/20 2058 07/30/20 0421  BP: (!) 134/57 (!) 152/83  Pulse: 86 81  Resp:  14 16  Temp: 99.6 F (37.6 C) 99.3 F (37.4 C)  SpO2: 100% 99%   Relatively controlled on 4/6 14.  Transaminitis: Resolved.    Resolved on 3/28. 15.  Obesity.  BMI 31.19.  Dietary follow-up 16.  History of asthma.  Continue Singulair 10 mg nightly.   17.  History of genital herpes.  Conitnue  Valtrex 18.  Sleep disturbance  Melatonin  ECG reviewed, trazodone 25 nightly started on 3/12, increased to 50  Improved 19.  Slow transit constipation  Bowel meds increased on 3/9, increased again on 3/11, decreased on 3/21 to optimize bowel function, suppository as needed 20.  Hypokalemia  Potassium 3.7 on 3/28, labs pending  Supplemented x2 days on 3/21 21.  Urinary retention  Flomax increased on 3/11  Continue I/O caths as necessary  Emptying now without significant residual 22. Tachycardia:   Continue Cardizem  Controlled on 4/6 23.  Acute gout on chronic degenerative joint disease  X-ray personally reviewed, and with patient, degenerative changes with some effusion  Voltaren gel ordered with benefit  Blood cultures ordered  May consider joint aspiration  with fluid analysis if no improvement  Colchicine started on 3/29, with benefit  Improving 24.  Left upper quadrant abdominal pain  See #3  X-ray unremarkable  Simethicone as needed ordered on 4/5  Improved  LOS: 33 days A FACE TO FACE EVALUATION WAS PERFORMED  Gem Conkle Lorie Phenix 07/30/2020, 10:24 AM

## 2020-07-30 NOTE — Progress Notes (Signed)
Occupational Therapy Session Note  Patient Details  Name: Tonya Myers MRN: [id number] Date of Birth: 01/19/1966  Today's Date: 07/30/2020 OT Individual Time: 1130-1200 and 1300-1400 OT Individual Time Calculation (min): 30 min and 60 min   Short Term Goals: Week 4:  OT Short Term Goal 1 (Week 4): patient will complete SB transfers min A OT Short Term Goal 2 (Week 4): patient will complete LB bathing / dressing min A OT Short Term Goal 3 (Week 4): patient will complete grooming and UB bathing/dressing set up level OT Short Term Goal 4 (Week 4): patient will complete toileting mod A  Skilled Therapeutic Interventions/Progress Updates:    1) Treatment session with focus on activity tolerance and dynamic sitting balance.  Pt received supine in bed agreeable to therapy session. Pt completed bed mobility with supervision to bring legs to EOB, then requiring min assist for sidelying to sitting at trunk.  Engaged in ball toss in sitting with CGA from therapist while pt's cousin tossed the ball to pt.  Pt demonstrating increased motor control with BUE, still with intermittent "dropping" of arms due to decreased sensation and proprioception.  Pt reports pain in L side during activity.  RN notified, applied heat back to L hip.  Pt returned to supine with min assist to lift RLE in to bed.  Pt remained semi-reclined in bed with all needs in reach.  2) Treatment session with focus on functional mobility, transfers, and activity tolerance.  Pt received semi-reclined in bed agreeable to therapy session.  Pt completed bed mobility with min assist for trunk control.  Pt changed shirt with setup assist and increased time.  Pt completed slide board transfer to w/c with min assist and therapist assist for placement.  Pt transported outside for change in scenery.  Engaged in w/c propulsion 30' x2 with cues over uneven surfaces and incline/decline.  Engaged in conversation about d/c readiness and progress towards  goals.  Pt pleased with progress and ability to propel w/c in outdoor setting for improved mood/emotion.  Pt transported back to room and transferred back to bed min assist via slide board with min facilitation initially for weight shifting.  Pt returned to supine with improved ability to lift BLE in to bed this session.  Pt remained semi-reclined in bed with all needs in reach.  Therapy Documentation Precautions:  Precautions Precautions: Fall,Back Restrictions Weight Bearing Restrictions: No Other Position/Activity Restrictions: spinal General:   Vital Signs: Therapy Vitals Temp: 98.4 F (36.9 C) Pulse Rate: (!) 101 Resp: 18 BP: 116/77 Patient Position (if appropriate): Lying Oxygen Therapy SpO2: 100 % O2 Device: Room Air Pain: Pain Assessment Pain Scale: 0-10 Pain Score: 0-No pain Pain Type: Chronic pain Pain Location: Hip Pain Orientation: Left Pain Descriptors / Indicators: Aching Pain Frequency: Constant Pain Onset: On-going Patients Stated Pain Goal: 2 Pain Intervention(s): Medication (See eMAR);Repositioned;Heat applied   Therapy/Group: Individual Therapy  Simonne Come 07/30/2020, 3:13 PM

## 2020-07-30 NOTE — Progress Notes (Signed)
Physical Therapy Session Note  Patient Details  Name: Tonya Myers MRN: [id number] Date of Birth: 1966-02-23  Today's Date: 07/30/2020 PT Individual Time:Session1: 7622-6333; SEssion2: 1100-1130 PT Individual Time Calculation (min): 60 min & 30 min  Short Term Goals: Week 5:  PT Short Term Goal 1 (Week 5): STG=LTG due to ELOS  Skilled Therapeutic Interventions/Progress Updates:   Session1: Patient in supine with family present.  Discussed plan and performed in bed lower body dressing with pants with set up.  Performed rolling to R with A for L knee flexion and side to sit with min A.  Patient transferred to power w/c with min A using transfer board.  Propelled in w/c to ortho gym with S.  Transferred to level height car simulated approximately small SUV height with mod A.  Patient propelled to general gym and performed transfer to mat with slide board min A.  Sit to stand from elevated height in [place] with dependent and stood for about 1 minute.  Patient transferred to power w/c with CGA.  Patient propelled to room and performed slide board transfer back to bed with min A.  Left in supine with family in the room and call bell/needs in reach.  Ross:  Patient in supine and agreeable to work on getting HEP for home.  Supine ankle pumps x 10, heel slides with strap for assistance x 10 each leg, hooklying hip abduction with orange band around knees x 10, hooklying bridge x 10 and lateral trunk rotation x 10.  Supine to sit from R sidelying min A and performed side bending leaning down on pillow on either side x 10, seated anterior weight shift onto feet with hands on armchair in front with A for safety x 5 reps.  Patient educated in all and issued to HEP.  Sit to supine with min A and scooting to [facility] with rails and S bed in trendelenburg.  Patient left with call bell in reach, RN and family  in the room.  Therapy Documentation Precautions:  Precautions Precautions:  Fall,Back Restrictions Weight Bearing Restrictions: No Other Position/Activity Restrictions: spinal Pain: Pain Assessment Pain Scale: 0-10 Pain Score: 6  Pain Type: Chronic pain Pain Location: Hip Pain Orientation: Left Pain Descriptors / Indicators: Aching Pain Frequency: Constant Pain Onset: On-going Patients Stated Pain Goal: 2 Pain Intervention(s): Medication (See eMAR);Repositioned;Heat applied   Therapy/Group: Individual Therapy  Reginia Naas  Magda Kiel, PT 07/30/2020, 12:16 PM

## 2020-07-30 NOTE — Patient Care Conference (Signed)
Inpatient RehabilitationTeam Conference and Plan of Care Update Date: 07/30/2020   Time: 11:40 AM    Patient Name: Tonya Myers      Medical Record Number: [id number]  Date of Birth: August 23, 1965 Sex: Female         Room/Bed: 4W20C/4W20C-01 Payor Info: Payor: MEDICARE / Plan: MEDICARE PART A AND B / Product Type: *No Product type* /    Admit Date/Time:  06/27/2020 12:46 PM  Primary Diagnosis:  Acute encephalopathy  Hospital Problems: Principal Problem:   Acute encephalopathy Active Problems:   CLL (chronic lymphocytic leukemia) (HCC)   Acute hemorrhoid   DDD (degenerative disc disease), cervical   Cervical disc disorder with radiculopathy of cervical region   Cervical facet syndrome   DDD (degenerative disc disease), lumbar   Facet syndrome, lumbar   Bilateral occipital neuralgia   Sacroiliac joint dysfunction   DJD of shoulder   Hypothyroidism   Arthritis   Carpal tunnel syndrome, bilateral   Chronic pain in right shoulder   Controlled type 2 diabetes mellitus without complication, without long-term current use of insulin (HCC)   Essential hypertension   Herpes simplex vulvovaginitis   Pneumococcal meningitis   Bacterial spinal epidural abscess   Meningitis   Labile blood glucose   Uncontrolled type 2 diabetes mellitus with hyperosmolar nonketotic hyperglycemia (HCC)   Transaminitis   Sleep disturbance   Anemia of chronic disease   Slow transit constipation   Urinary retention   Hypokalemia   Acute pain of right knee   Pain   Left upper quadrant abdominal pain    Expected Discharge Date: Expected Discharge Date: 07/31/20  Team Members Present: Physician leading conference: Dr. Delice Lesch Care Coodinator Present: Tonya Chihuahua, RN, BSN, CRRN;Tonya Dupree, [name] Nurse Present: Tonya Chihuahua, RN PT Present: Tonya Myers, PT OT Present: Tonya Myers, OT PPS Coordinator present : Tonya Myers, SLP     Current Status/Progress Goal Weekly Team Focus   Bowel/Bladder   Continent b/b with occassional bladder incontinence-improving LBM 4/5 small  Pt to be fully continent.  Assess q shift and PRN- offer timed toileting as needed   Swallow/Nutrition/ Hydration             ADL's   LB bathing/dressing bed level min A, shower bathing min A, UB bathing/dressing CS/set up, SB transfers min A, toileting bed level mod A - encourage commode mod A  min A  adl and transfer training, patient/family education and discharge prep   Mobility   min A overall except later in the day  min A w/c level  HEP, power chair, transfers   Communication             Safety/Cognition/ Behavioral Observations            Pain   Denies pain at present. Voltaren PRN  Pain level <3/10  Assess and treat PRN   Skin   Back incision closed- pink/scar tissue  no new breakdown  Assess q shift and PRN     Discharge Planning:  Family education completed and equipment delivered and both pt and Tonya Myers feels ready for DC tomorrow.   Team Discussion: MD adjusting medications.  Non-specific pain left hip-ribs, etc. On target for discharge. Continent of bowel and bladder.   Patient on target to meet rehab goals: yes, Min assist for lower body bathing and dressing and close supervision for upper body ADLs.  *See Care Plan and progress notes for long and short-term goals.   Revisions to Treatment Plan:  Teaching Needs: Family education completed  Current Barriers to Discharge: Decreased caregiver support, Home enviroment access/layout and Weight bearing restrictions  Possible Resolutions to Barriers: Loaner power chair delivered to home 07/30/20. Difficult car transfers noted; recommended non-emergent ambulance transport to home.      Medical Summary Current Status: Decreased functional ability secondary to acute encephalopathy.  Barriers to Discharge: Medical stability;Behavior;IV antibiotics;Incontinence;Decreased family/caregiver support   Possible Resolutions to  Tonya Myers: Therapies, patient and family edu, gout meds, optimize DM/HTN meds, follow labs - Hb   Continued Need for Acute Rehabilitation Level of Care: The patient requires daily medical management by a physician with specialized training in physical medicine and rehabilitation for the following reasons: Direction of a multidisciplinary physical rehabilitation program to maximize functional independence : Yes Medical management of patient stability for increased activity during participation in an intensive rehabilitation regime.: Yes Analysis of laboratory values and/or radiology reports with any subsequent need for medication adjustment and/or medical intervention. : Yes   I attest that I was present, lead the team conference, and concur with the assessment and plan of the team.   Tonya Myers B 07/30/2020, 2:55 PM

## 2020-07-30 NOTE — Plan of Care (Signed)
  Problem: RH Balance Goal: LTG Patient will maintain dynamic sitting balance (PT) Description: LTG:  Patient will maintain dynamic sitting balance with assistance during mobility activities (PT) Outcome: Completed/Met Goal: LTG Patient will maintain dynamic standing balance (PT) Description: LTG:  Patient will maintain dynamic standing balance with assistance during mobility activities (PT) Outcome: Completed/Met   Problem: Sit to Stand Goal: LTG:  Patient will perform sit to stand with assistance level (PT) Description: LTG:  Patient will perform sit to stand with assistance level (PT) Outcome: Completed/Met   Problem: RH Bed Mobility Goal: LTG Patient will perform bed mobility with assist (PT) Description: LTG: Patient will perform bed mobility with assistance, with/without cues (PT). Outcome: Not Met (add Reason) Note: Not consistently CGA for bed mobility, needing min A at times   Problem: RH Bed to Chair Transfers Goal: LTG Patient will perform bed/chair transfers w/assist (PT) Description: LTG: Patient will perform bed to chair transfers with assistance (PT). Outcome: Completed/Met   Problem: RH Car Transfers Goal: LTG Patient will perform car transfers with assist (PT) Description: LTG: Patient will perform car transfers with assistance (PT). Outcome: Not Met (add Reason) Note: Able to perform with mod A to car simulated at small SUV height, but not to sedan or truck height she has at home.    Problem: RH Wheelchair Mobility Goal: LTG Patient will propel w/c in controlled environment (PT) Description: LTG: Patient will propel wheelchair in controlled environment, # of feet with assist (PT) Outcome: Completed/Met Goal: LTG Patient will propel w/c in home environment (PT) Description: LTG: Patient will propel wheelchair in home environment, # of feet with assistance (PT). Outcome: Completed/Met  Magda Kiel, PT

## 2020-07-30 NOTE — Progress Notes (Signed)
Inpatient Rehabilitation Care Coordinator Discharge Note  The overall goal for the admission was met for:   Discharge location: Yes-HOME WITH [name] PROVIDING 24/7 CARE  Length of Stay: Yes-34 DAYS  Discharge activity level: Yes-MIN WHEELCHAIR LEVEL  Home/community participation: Yes  Services provided included: MD, RD, PT, OT, SLP, RN, CM, Pharmacy, Neuropsych and SW  Financial Services: Medicare and Private Insurance: [employer]  Choices offered to/list presented to:YES  Follow-up services arranged: Home Health: [facility]-PT,OT,SP CNA, DME: [name]-POWER WHEELCHAIR AND TRASNFER BOARD and Patient/Family has no preference for HH/DME agencies [name] GOT WIDE DROP-ARM BEDSIDE COMMODE ON OWN. GAVE [name] IN [place]  Comments (or additional information):[name] WAS HERE DAILY AND PARTICIPATED IN THERAPIES AND BOTH FEEL COMFORTABLE WITH CARE FOR DC. PLAN TOBEGIN WITH HH AND THEN TRANSITION TO OP ONCE APPROPRIATE. PTAR SET UP FOR 10:30 TRANSPORT HOME  Patient/Family verbalized understanding of follow-up arrangements: Yes  Individual responsible for coordination of the follow-up plan: [name]-HUSBAND [phone]  Confirmed correct DME delivered: ,  G 07/30/2020    ,  G 

## 2020-07-31 LAB — GLUCOSE, CAPILLARY: Glucose-Capillary: 105 mg/dL — ABNORMAL HIGH (ref 70–99)

## 2020-07-31 NOTE — Progress Notes (Signed)
Patient discharged home with spouse, EMT transport via stretcher.  Patient alert and oriented and verbalized understanding to discharge instructions and transport.

## 2020-07-31 NOTE — Progress Notes (Signed)
PROGRESS NOTE   Subjective/Complaints: Patient seen sitting up in bed this morning.  Significant other bedside.  She states she slept well overnight.  She states she is ready for discharge.  She is questions regarding follow-up appointments.  ROS: Denies CP, SOB, N/V/D  Objective:   No results found. No results for input(s): WBC, HGB, HCT, PLT in the last 72 hours. Recent Labs    07/28/20 1535  NA 140  K 3.7  CL 103  CO2 28  GLUCOSE 181*  BUN 9  CREATININE 0.71  CALCIUM 9.0    Intake/Output Summary (Last 24 hours) at 07/31/2020 1312 Last data filed at 07/31/2020 0746 Gross per 24 hour  Intake 360 ml  Output --  Net 360 ml        Physical Exam: Vital Signs Blood pressure (!) 149/96, pulse 81, temperature 99.9 F (37.7 C), temperature source Oral, resp. rate 16, height 6' (1.829 m), weight 99.7 kg, SpO2 99 %. Constitutional: No distress . Vital signs reviewed. HENT: Normocephalic.  Atraumatic. Eyes: EOMI. No discharge. Cardiovascular: No JVD.  RRR. Respiratory: Normal effort.  No stridor.  Bilateral clear to auscultation. GI: Non-distended.  BS +. Skin: Warm and dry.  Intact. Psych: Normal mood.  Normal behavior. Musc: Right knee with edema and tenderness, improved Neuro: Alert Motor: Bilateral upper extremities: 4/5 proximal distal, grip 4-/5, stable Left lower extremities: Hip flexion 3 +/5, knee extension 4-/5, distally 4+/5 Right lower extremities: Hip flexion 2+/5, knee extension 2/5, distally 4+/5, stable Bilateral upper extremity ataxia, improving  Assessment/Plan: 1. Functional deficits which require 3+ hours per day of interdisciplinary therapy in a comprehensive inpatient rehab setting.  Physiatrist is providing close team supervision and 24 hour management of active medical problems listed below.  Physiatrist and rehab team continue to assess barriers to discharge/monitor patient progress toward  functional and medical goals  Care Tool:  Bathing  Bathing activity did not occur: Refused Body parts bathed by patient: Right arm,Left arm,Chest,Abdomen,Front perineal area,Buttocks,Right upper leg,Left upper leg,Right lower leg,Left lower leg,Face   Body parts bathed by helper: Buttocks     Bathing assist Assist Level: Minimal Assistance - Patient > 75%     Upper Body Dressing/Undressing Upper body dressing Upper body dressing/undressing activity did not occur (including orthotics): Refused What is the patient wearing?: Pull over shirt    Upper body assist Assist Level: Supervision/Verbal cueing    Lower Body Dressing/Undressing Lower body dressing    Lower body dressing activity did not occur: Refused What is the patient wearing?: Pants,Incontinence brief     Lower body assist Assist for lower body dressing: Minimal Assistance - Patient > 75%     Toileting Toileting Toileting Activity did not occur Landscape architect and hygiene only): Refused  Toileting assist Assist for toileting: Moderate Assistance - Patient 50 - 74%     Transfers Chair/bed transfer  Transfers assist     Chair/bed transfer assist level: Minimal Assistance - Patient > 75%     Locomotion Ambulation   Ambulation assist      Assist level: 2 helpers Assistive device: Ethelene Hal Max distance: 5ft   Walk 10 feet activity   Assist  Walk 10  feet activity did not occur: Safety/medical concerns        Walk 50 feet activity   Assist Walk 50 feet with 2 turns activity did not occur: Safety/medical concerns         Walk 150 feet activity   Assist Walk 150 feet activity did not occur: Safety/medical concerns         Walk 10 feet on uneven surface  activity   Assist Walk 10 feet on uneven surfaces activity did not occur: Safety/medical concerns         Wheelchair     Assist Will patient use wheelchair at discharge?: Yes Type of Wheelchair: Manual Wheelchair  activity did not occur: Safety/medical concerns  Wheelchair assist level: Supervision/Verbal cueing Max wheelchair distance: 200'    Wheelchair 50 feet with 2 turns activity    Assist    Wheelchair 50 feet with 2 turns activity did not occur: Safety/medical concerns   Assist Level: Supervision/Verbal cueing   Wheelchair 150 feet activity     Assist  Wheelchair 150 feet activity did not occur: Safety/medical concerns   Assist Level: Supervision/Verbal cueing   Medical Problem List and Plan: 1.  Decreased functional ability secondary to acute encephalopathy.  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    DC today  Will see patient for hospital follow-up in 1 month post-discharge 2.  Antithrombotics: -DVT/anticoagulation: Continue Lovenox 52.5 mg daily.              -antiplatelet therapy: N/A 3. Pain Management: Oxycodone as needed  Robaxin 500 3 times daily scheduled on 3/12, continue  Right knee extremely tender to touch on 3/28  See #23  Lidoderm patch added to left upper quadrant on 4/1  Gabapentin 300 mg twice daily, decreased to 100AM and 200 nightly due to sedation (400 mg twice a day PTA)  Controlled with meds on 4/7 4. Mood: Team support             -antipsychotic agents: N/A 5. Neuropsych: This patient is not capable of making decisions on her own behalf.  Requires consistent positive reinforcement 6. Skin/Wound Care: Routine skin checks 7. Fluids/Electrolytes/Nutrition: Routine in and outs 8.  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    Completed course of Rocephin on 3/30 and follow-up infectious disease.  Repeat blood cultures no growth. 9.  Diabetes mellitus with hyperglycemia/DKA.  Hemoglobin A1c 8.0.  Check blood  sugars before meals and at bedtime  CBG (last 3)  Recent Labs    07/30/20 1648 07/30/20 2111 07/31/20 0640  GLUCAP 167* 151* 105*   Lantus insulin 45 units daily, decreased to 40 on 3/16, decreased to 35 on 3/22  Decreased Novolog to 3U TID, DC'd on 3/29  Increased Metformin to 500mg  BID.   Slightly elevated on 4/7, continue monitor ambulatory setting with potential further adjustments as necessary 10.  CLL.  Follow-up outpatient Dr. Delight Hoh 11.  Hypothyroidism.  Synthroid 12.  Anemia of chronic disease.  Multifactorial.   Hemoglobin 9.3 on 3/29  Continue to monitor 13.  Hypertension.  Dilacor 120 mg daily,Micardis 80-25 mg daily           Vitals:   07/30/20 1916 07/31/20 0500  BP: (!) 152/67 (!) 149/96  Pulse: 91 81  Resp: 20 16  Temp: (!) 97.4 F (36.3 C) 99.9 F (37.7  C)  SpO2: 100% 99%   Has been relatively controlled, slightly elevated this a.m., continue to monitor in ambulatory setting 14.  Transaminitis: Resolved.    Resolved on 3/28. 15.  Obesity.  BMI 31.19.  Dietary follow-up 16.  History of asthma.  Continue Singulair 10 mg nightly.   17.  History of genital herpes.  Conitnue  Valtrex 18.  Sleep disturbance  Melatonin  ECG reviewed, trazodone 25 nightly started on 3/12, increased to 50  Improved 19.  Slow transit constipation  Bowel meds increased on 3/9, increased again on 3/11, decreased on 3/21 to optimize bowel function, suppository as needed 20.  Hypokalemia  Potassium 3.7 on 4/4  Supplemented x2 days on 3/21 21.  Urinary retention  Flomax increased on 3/11  Continue I/O caths as necessary  Emptying now without significant residual 22. Tachycardia:   Continue Cardizem  Controlled on 4/7 23.  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  Left upper quadrant abdominal pain  See #3  X-ray unremarkable  Simethicone as needed ordered on 4/5  Improved  > 30 minutes spent in total in discharge planning between myself and PA regarding aforementioned, as well discussion regarding DME equipment, follow-up appointments, follow-up therapies, discharge medications, discharge recommendations, answering questions.  Please see discharge summary as well.  LOS: 34 days A FACE TO FACE EVALUATION WAS PERFORMED  Jorey Dollard Lorie Phenix 07/31/2020, 1:12 PM

## 2020-08-01 ENCOUNTER — Telehealth: Payer: Self-pay

## 2020-08-01 NOTE — Telephone Encounter (Signed)
Per protocol hospital note reviewed: Verbal okay given for initiation of care to [facility]. To start 08/03/2020.  ([place] [phone]).

## 2020-08-04 ENCOUNTER — Telehealth: Payer: Self-pay

## 2020-08-04 ENCOUNTER — Telehealth: Payer: Self-pay | Admitting: *Deleted

## 2020-08-04 NOTE — Telephone Encounter (Signed)
Transitional Care call--I spoke with Markelle    1. Are you/is patient experiencing any problems since coming home? Are there any questions regarding any aspect of care? NO, had a rough couple of days transferring from bed to wheelchair 2. Are there any questions regarding medications administration/dosing? Are meds being taken as prescribed? Patient should review meds with caller to confirm yes she has all medication 3. Have there been any falls? NO 4. Has Home Health been to the house and/or have they contacted you? If not, have you tried to contact them? Can we help you contact them? YES PT AND OT HAVE BEEN OUT 5. Are bowels and bladder emptying properly? Are there any unexpected incontinence issues? If applicable, is patient following bowel/bladder programs? NO PROBLEM 6. Any fevers, problems with breathing, unexpected pain? NO 7. Are there any skin problems or new areas of breakdown? NO 8. Has the patient/family member arranged specialty MD follow up (ie cardiology/neurology/renal/surgical/etc)?  Can we help arrange? SHE HAS REACHED OUT AND HAD TELEVISIT WITH HER PCP AND WILL SEE HIM IN THE OFFICE 4/25. SHE HAS DISCUSSED ALL HER MEDICATIONS. THE NEUROSURGEON HAS CALLED HER AND WILL BE SCHEDULING WITH HIM BUT SHE IS UNABLE TO MAKE IT IN TO AN APPT AS OF YET--NOT READY TO DO TRANSFERS WELL ENOUGH TO FEEL COMFORTABLE. SHE HAS APPT WITH ENDOCRINOLOGY ON 09/16/20. SHE HAS NOT SPOKEN WITH INFECTIOUS DISEASE, I HAVE ENCOURAGED HER TO CALL AND DISCUSS APPT WITH THEM.  HER APPT WITH OUR OFFICE WILL NEED TO BE RESCHEDULED. SHE WILL NOT BE READY TO GO OUT TO APPTS BY THEN.  WE WILL CALL AND RESCHEDULE. 9. Does the patient need any other services or support that we can help arrange? NO 10. Are caregivers following through as expected in assisting the patient? YES 11. Has the patient quit smoking, drinking alcohol, or using drugs as recommended? YES  Appointment time, arrive time and who it is with here   TBD  AS SCHEDULED TIME AND DATE NOT AGREEABLE WITH PATIENT..  [place]

## 2020-08-04 NOTE — Telephone Encounter (Signed)
Cristie Hem, PT from [facility] called requesting verbal orders for HHPT 1wk1.2wk6, 1wk2. HHAide 2wk4, 1wk4. HHMSW 1wk1 eval. Skilled nurse 1wk1 for medication management. Approved orders.

## 2020-08-05 ENCOUNTER — Telehealth: Payer: Self-pay

## 2020-08-05 NOTE — Telephone Encounter (Signed)
Hospital discharge summary reviewed per protocol: Verbal Okay given to Ester RN, Occupation Therapist with [facility] for in-home services. Occupational Therapy once a week for one week, then twice a week for 8 weeks.  Call back phone [phone].

## 2020-08-05 NOTE — Telephone Encounter (Signed)
Chart reviewed per protocol: Verbal okay given for in-home skilled nursing care. Granted for twice a week for two weeks, once a week for three weeks, then twice a week PRN.   Call back phone number [phone].

## 2020-08-11 ENCOUNTER — Telehealth: Payer: Self-pay | Admitting: *Deleted

## 2020-08-11 ENCOUNTER — Encounter: Payer: Medicare Other | Admitting: Registered Nurse

## 2020-08-11 NOTE — Telephone Encounter (Signed)
Tiffany from [facility] called and says that Charnette is requesting a new hospital bed and hoyer lift. Apparently the bed and hoyer lift are not satisfactory. I spoke with Sharee Pimple with [name] and their social worker was going to try and reach out to the person from the hospital (Rebeca Dupree?) and see if they can get the matter resolved.

## 2020-08-12 ENCOUNTER — Encounter: Payer: Medicare Other | Admitting: Registered Nurse

## 2020-08-13 ENCOUNTER — Telehealth: Payer: Self-pay | Admitting: Physical Medicine & Rehabilitation

## 2020-08-13 NOTE — Telephone Encounter (Signed)
Patient is requesting a Hospital Bed electric a Gel overlay and Hoyer lift for her home her current bed is not suitable, patient keeps sliding out.

## 2020-08-13 NOTE — Telephone Encounter (Signed)
We can order a hospital bed.  Thanks.

## 2020-08-15 NOTE — Telephone Encounter (Signed)
Usually if the therapists request one, they send [place] DME forms.  If the family is requesting one, who is going to teach him how to use it?

## 2020-08-15 NOTE — Telephone Encounter (Signed)
Is it ok to order the hoyer lift as well?

## 2020-08-18 NOTE — Telephone Encounter (Signed)
I called [facility] and spoke with Tiffany who first brought up the request for the hospital bed and hoyer lift. (I had spoke with Randel Books and she had not been contacted about these supplies)  Jonelle Sidle says she faxed over the request to [name] for the bed and does not know if the exchange has taken place. She gave me Aldona Bar PTA 's number who will be seeing Tonya Myers tomorrow.  I left a message for Aldona Bar about the hoyer lift and to assess the situation and let us know if it is needed and if they will be instructing her on the use of the lift if ordered.

## 2020-08-21 ENCOUNTER — Encounter: Payer: Medicare Other | Attending: Registered Nurse | Admitting: Registered Nurse

## 2020-08-21 ENCOUNTER — Encounter: Payer: Self-pay | Admitting: Registered Nurse

## 2020-08-21 ENCOUNTER — Other Ambulatory Visit: Payer: Self-pay

## 2020-08-21 VITALS — BP 120/60 | Temp 97.6°F

## 2020-08-21 DIAGNOSIS — E1100 Type 2 diabetes mellitus with hyperosmolarity without nonketotic hyperglycemic-hyperosmolar coma (NKHHC): Secondary | ICD-10-CM

## 2020-08-21 DIAGNOSIS — G9340 Encephalopathy, unspecified: Secondary | ICD-10-CM | POA: Diagnosis not present

## 2020-08-21 DIAGNOSIS — G062 Extradural and subdural abscess, unspecified: Secondary | ICD-10-CM | POA: Diagnosis not present

## 2020-08-21 NOTE — Progress Notes (Addendum)
Subjective:    Patient ID: Tonya Myers, female    DOB: Oct 15, 1965, 55 y.o.   MRN: [id number]  HPI: Tonya Myers is a 55 y.o. female whose Myers was changed to a telephone visit, due to lack of transportation. Tonya Myers, agrees with telephone visit and verbalizes understanding. Today visit is Tonya Myers following up of her Acute Encephalopathy, Uncontrolled Type 2 DM with hyperglycemia and bacterial spinal epidural abscess. She presented to [facility] on 06/09/2020 with altered menta status and expressive aphasia. Neurology was consulted.   CT Head WO Contrast:  IMPRESSION: No acute intracranial abnormality.  MR Brain WO Contrast:  IMPRESSION: Normal brain MRI.  IMPRESSION: 1. MR findings consistent with septic arthritis involving the left L2-3 and L3-4 facet joints with associated abscess is in the left paraspinal muscles. 2. Suspect small epidural abscess posteriorly at L2-3 on the left side. 3. No definite MR findings for discitis or osteomyelitis in the cervical, thoracic or lumbar spines. 4. Degenerative cervical spondylosis with multilevel disc disease and facet disease. Multilevel disc protrusions most significant at C3-4.MR: Cervical/Thoracic and Lumbar:  Tonya Myers underwent: on 06/17/20 by Dr Lacinda Axon THORACIC LAMINECTOMY FOR EPIDURAL ABSCESS  Tonya Myers was admitted to inpatient rehabilitation on 06/27/2020 and discharged home on 07/31/2020. She is receiving Home Health Therapy with [facility]. She stated she has pain in her mid- back. She rates her pain 6. Also reports she has a good appetite.    Pain Inventory Average Pain 7 Pain Right Now 6 My pain is intermittent and aching  LOCATION OF PAIN  Right shoulder blade  BOWEL Number of stools per week: 3 Oral laxative use Yes  Type of laxative Ducolax Enema or suppository use No  History of colostomy No  Incontinent No   BLADDER Normal In and out cath, frequency N/A Able to self cath No   Bladder incontinence No  Frequent urination No  Leakage with coughing No  Difficulty starting stream No  Incomplete bladder emptying No    Mobility use a wheelchair transfers alone Do you have any goals in this area?  yes  Function retired I need assistance with the following:  feeding, dressing, bathing, toileting, meal prep, household duties and shopping Do you have any goals in this area?  yes  Neuro/Psych weakness numbness tremor tingling trouble walking spasms  Prior Studies Any changes since last visit?  no New Patient  Physicians involved in your care Any changes since last visit?  no New Patient   Family History  Problem Relation Age of Onset  . Cancer Paternal Aunt   . Breast cancer Maternal Aunt 70   Social History   Socioeconomic History  . Marital status: Single    Spouse name: Not on file  . Number of children: Not on file  . Years of education: Not on file  . Highest education level: Not on file  Occupational History  . Not on file  Tobacco Use  . Smoking status: Never Smoker  . Smokeless tobacco: Never Used  Vaping Use  . Vaping Use: Never used  Substance and Sexual Activity  . Alcohol use: Yes    Alcohol/week: 1.0 standard drink    Types: 1 Cans of beer per week  . Drug use: No  . Sexual activity: Not Currently    Birth control/protection: None  Other Topics Concern  . Not on file  Social History Narrative  . Not on file   Social Determinants of Health   Financial Resource Strain:  Not on file  Food Insecurity: Not on file  Transportation Needs: Not on file  Physical Activity: Not on file  Stress: Not on file  Social Connections: Not on file   Past Surgical History:  Procedure Laterality Date  . ABDOMINAL HYSTERECTOMY    . BILATERAL SALPINGOOPHORECTOMY  2009  . BREAST BIOPSY Right 05/17/2016   FIBROADENOMATOUS CHANGE AND SCLEROSING ADENOSIS WITH  . COLONOSCOPY WITH PROPOFOL N/A 06/16/2015   Procedure: COLONOSCOPY WITH  PROPOFOL;  Surgeon: [name], MD;  Location: [facility];  Service: Endoscopy;  Laterality: N/A;  . LAPAROSCOPIC SUPRACERVICAL HYSTERECTOMY  2009   due to [place]  . OOPHORECTOMY    . THORACIC LAMINECTOMY FOR EPIDURAL ABSCESS Bilateral 06/17/2020   Procedure: THORACIC LAMINECTOMY FOR EPIDURAL ABSCESS;  Surgeon: Deetta Perla, MD;  Location: [facility] ORS;  Service: Neurosurgery;  Laterality: Bilateral;   Past Medical History:  Diagnosis Date  . Acute hemorrhoid 03/11/2015  . Anxiety   . Arthritis   . Asthma   . Cancer ([place])   . Chronic back pain   . CLL (chronic lymphocytic leukemia) ([place])   . Depression   . Diabetes mellitus without complication ([place])   . Genital herpes    type 2  . Hypertension   . Hypothyroidism   . Vertigo    BP 120/60 Comment: Per patient- Todays visit is a virtual visit.  Temp 97.6 F (36.4 C)   Opioid Risk Score:   Fall Risk Score:  `1  Depression screen PHQ 2/9  Depression screen PHQ 2/9 07/29/2015  Decreased Interest 0  Down, Depressed, Hopeless 0  PHQ - 2 Score 0   Review of Systems  Musculoskeletal: Positive for gait problem and joint swelling.  Neurological: Positive for weakness and numbness.  All other systems reviewed and are negative.      Objective:   Physical Exam Vitals and nursing note reviewed.  Musculoskeletal:     Comments: No Physical Exam Performed : Telephone Visit           Assessment & Plan:  1. Acute Encephalopathy: Continue Home Health Therapy with [facility].  2.  Uncontrolled Type 2 DM with hyperglycemia: Continue current medication regimen. PCP following.  3.Bacterial spinal epidural abscess.ID Following. Continue to Monitor.   Telephone Visit Established Patient Location of Patient: In her Home Location of Provider: In the Office Total Time Spent: 20 Minutes  F/U with Dr Dagoberto Ligas per Dr Posey Pronto

## 2020-08-25 ENCOUNTER — Telehealth: Payer: Self-pay | Admitting: Registered Nurse

## 2020-08-25 NOTE — Telephone Encounter (Signed)
This morning this provider called [place], Ms. Pennywell had reported her current bed was too short. Her legs are hanging off the bed. New prescription sent to [facility]. RX: Fully Electric Bed, with bed extension. Placed a call to Ms. Ponciano regarding the above. She verbalizes understanding.

## 2020-09-03 DIAGNOSIS — A6004 Herpesviral vulvovaginitis: Secondary | ICD-10-CM

## 2020-09-03 DIAGNOSIS — C9110 Chronic lymphocytic leukemia of B-cell type not having achieved remission: Secondary | ICD-10-CM | POA: Diagnosis not present

## 2020-09-03 DIAGNOSIS — M5481 Occipital neuralgia: Secondary | ICD-10-CM

## 2020-09-03 DIAGNOSIS — M47895 Other spondylosis, thoracolumbar region: Secondary | ICD-10-CM | POA: Diagnosis not present

## 2020-09-03 DIAGNOSIS — E039 Hypothyroidism, unspecified: Secondary | ICD-10-CM

## 2020-09-03 DIAGNOSIS — E669 Obesity, unspecified: Secondary | ICD-10-CM

## 2020-09-03 DIAGNOSIS — J45909 Unspecified asthma, uncomplicated: Secondary | ICD-10-CM

## 2020-09-03 DIAGNOSIS — E782 Mixed hyperlipidemia: Secondary | ICD-10-CM

## 2020-09-03 DIAGNOSIS — M5010 Cervical disc disorder with radiculopathy, unspecified cervical region: Secondary | ICD-10-CM | POA: Diagnosis not present

## 2020-09-03 DIAGNOSIS — R339 Retention of urine, unspecified: Secondary | ICD-10-CM

## 2020-09-03 DIAGNOSIS — G8929 Other chronic pain: Secondary | ICD-10-CM

## 2020-09-03 DIAGNOSIS — M10061 Idiopathic gout, right knee: Secondary | ICD-10-CM

## 2020-09-03 DIAGNOSIS — K5901 Slow transit constipation: Secondary | ICD-10-CM

## 2020-09-03 DIAGNOSIS — M150 Primary generalized (osteo)arthritis: Secondary | ICD-10-CM

## 2020-09-03 DIAGNOSIS — M5388 Other specified dorsopathies, sacral and sacrococcygeal region: Secondary | ICD-10-CM

## 2020-09-03 DIAGNOSIS — Z6831 Body mass index (BMI) 31.0-31.9, adult: Secondary | ICD-10-CM

## 2020-09-03 DIAGNOSIS — E119 Type 2 diabetes mellitus without complications: Secondary | ICD-10-CM

## 2020-09-03 DIAGNOSIS — G5603 Carpal tunnel syndrome, bilateral upper limbs: Secondary | ICD-10-CM

## 2020-09-03 DIAGNOSIS — Z4789 Encounter for other orthopedic aftercare: Secondary | ICD-10-CM | POA: Diagnosis not present

## 2020-09-03 DIAGNOSIS — I10 Essential (primary) hypertension: Secondary | ICD-10-CM

## 2020-09-03 DIAGNOSIS — G479 Sleep disorder, unspecified: Secondary | ICD-10-CM

## 2020-09-03 DIAGNOSIS — Z90710 Acquired absence of both cervix and uterus: Secondary | ICD-10-CM

## 2020-09-03 DIAGNOSIS — D630 Anemia in neoplastic disease: Secondary | ICD-10-CM

## 2020-10-01 ENCOUNTER — Encounter: Payer: Self-pay | Admitting: Emergency Medicine

## 2020-10-01 ENCOUNTER — Emergency Department: Payer: Medicare Other

## 2020-10-01 ENCOUNTER — Observation Stay
Admission: EM | Admit: 2020-10-01 | Discharge: 2020-10-02 | Disposition: A | Discharge status: Home / Self Care | Payer: Medicare Other | Attending: Internal Medicine | Admitting: Internal Medicine

## 2020-10-01 ENCOUNTER — Other Ambulatory Visit: Payer: Self-pay

## 2020-10-01 DIAGNOSIS — I10 Essential (primary) hypertension: Secondary | ICD-10-CM | POA: Diagnosis not present

## 2020-10-01 DIAGNOSIS — E119 Type 2 diabetes mellitus without complications: Secondary | ICD-10-CM | POA: Diagnosis not present

## 2020-10-01 DIAGNOSIS — C9110 Chronic lymphocytic leukemia of B-cell type not having achieved remission: Secondary | ICD-10-CM | POA: Diagnosis present

## 2020-10-01 DIAGNOSIS — Z856 Personal history of leukemia: Secondary | ICD-10-CM | POA: Diagnosis present

## 2020-10-01 DIAGNOSIS — Z7984 Long term (current) use of oral hypoglycemic drugs: Secondary | ICD-10-CM | POA: Diagnosis not present

## 2020-10-01 DIAGNOSIS — E039 Hypothyroidism, unspecified: Secondary | ICD-10-CM | POA: Diagnosis present

## 2020-10-01 DIAGNOSIS — R4781 Slurred speech: Secondary | ICD-10-CM | POA: Diagnosis present

## 2020-10-01 DIAGNOSIS — Z79899 Other long term (current) drug therapy: Secondary | ICD-10-CM | POA: Diagnosis not present

## 2020-10-01 DIAGNOSIS — Z20822 Contact with and (suspected) exposure to covid-19: Secondary | ICD-10-CM | POA: Diagnosis not present

## 2020-10-01 DIAGNOSIS — J45909 Unspecified asthma, uncomplicated: Secondary | ICD-10-CM | POA: Diagnosis not present

## 2020-10-01 DIAGNOSIS — R29818 Other symptoms and signs involving the nervous system: Secondary | ICD-10-CM | POA: Diagnosis not present

## 2020-10-01 DIAGNOSIS — Z794 Long term (current) use of insulin: Secondary | ICD-10-CM | POA: Diagnosis not present

## 2020-10-01 DIAGNOSIS — R479 Unspecified speech disturbances: Secondary | ICD-10-CM

## 2020-10-01 DIAGNOSIS — R531 Weakness: Secondary | ICD-10-CM

## 2020-10-01 DIAGNOSIS — G459 Transient cerebral ischemic attack, unspecified: Secondary | ICD-10-CM

## 2020-10-01 DIAGNOSIS — F444 Conversion disorder with motor symptom or deficit: Secondary | ICD-10-CM

## 2020-10-01 LAB — BASIC METABOLIC PANEL
Anion gap: 8 (ref 5–15)
BUN: 23 mg/dL — ABNORMAL HIGH (ref 6–20)
CO2: 22 mmol/L (ref 22–32)
Calcium: 9.1 mg/dL (ref 8.9–10.3)
Chloride: 111 mmol/L (ref 98–111)
Creatinine, Ser: 1.1 mg/dL — ABNORMAL HIGH (ref 0.44–1.00)
GFR, Estimated: 59 mL/min — ABNORMAL LOW (ref >60)
Glucose, Bld: 92 mg/dL (ref 70–99)
Potassium: 4.2 mmol/L (ref 3.5–5.1)
Sodium: 141 mmol/L (ref 135–145)

## 2020-10-01 LAB — CBC
HCT: 36.4 % (ref 36.0–46.0)
Hemoglobin: 11.3 g/dL — ABNORMAL LOW (ref 12.0–15.0)
MCH: 25.2 pg — ABNORMAL LOW (ref 26.0–34.0)
MCHC: 31 g/dL (ref 30.0–36.0)
MCV: 81.1 fL (ref 80.0–100.0)
Platelets: 302 10*3/uL (ref 150–400)
RBC: 4.49 MIL/uL (ref 3.87–5.11)
RDW: 16.7 % — ABNORMAL HIGH (ref 11.5–15.5)
WBC: 5.8 10*3/uL (ref 4.0–10.5)
nRBC: 0 % (ref 0.0–0.2)

## 2020-10-01 IMAGING — DX DG CHEST 1V PORT
1 series · 1 of 1 positions shown · non-contrast
Comparison: [DATE].

CLINICAL DATA: Fever and abnormal speech.

EXAM:
PORTABLE CHEST 1 VIEW

[chest ap]
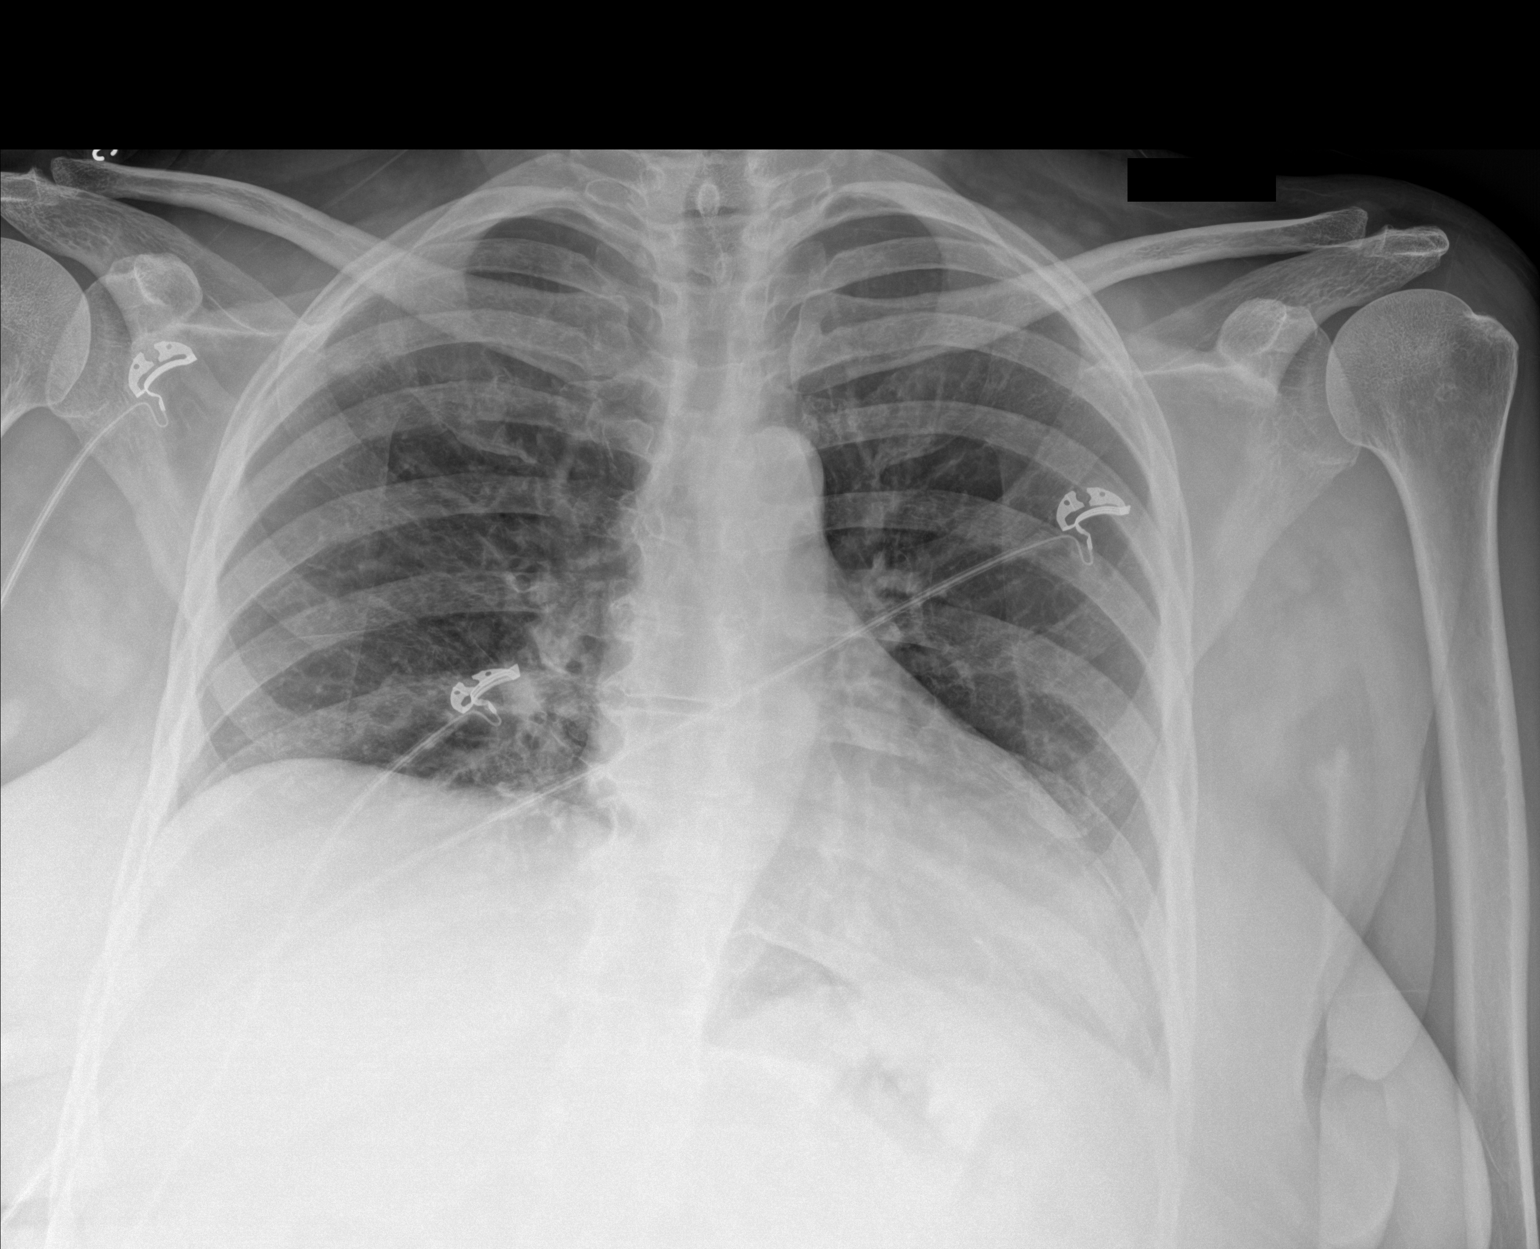

[1 of 1 positions shown; findings below may reference images not displayed]

FINDINGS: Low lung volumes are seen with mild elevation of the right
hemidiaphragm. There is no evidence of acute infiltrate, pleural
effusion or pneumothorax. The heart size and mediastinal contours
are within normal limits. Degenerative changes seen within the
thoracic spine.
IMPRESSION: No active cardiopulmonary disease.

## 2020-10-01 NOTE — ED Triage Notes (Addendum)
Pt comes into the ED via POV c/o hypotension and change in speech that started Sunday. Pt was diagnosed with meningitis 2 months ago.  PT has noticed a new stutter in her speech and her they noticed hypotension at her physical therapy appt today.  Pt does not present with hypotension at this time and is able to answer all questions appropriately. Pt states she cant check her balance because she hasnt been able to stand since being diagnosed with meningitis.

## 2020-10-02 ENCOUNTER — Observation Stay: Payer: Medicare Other

## 2020-10-02 DIAGNOSIS — I10 Essential (primary) hypertension: Secondary | ICD-10-CM | POA: Diagnosis not present

## 2020-10-02 DIAGNOSIS — F444 Conversion disorder with motor symptom or deficit: Secondary | ICD-10-CM

## 2020-10-02 DIAGNOSIS — R29818 Other symptoms and signs involving the nervous system: Secondary | ICD-10-CM | POA: Diagnosis not present

## 2020-10-02 DIAGNOSIS — C9110 Chronic lymphocytic leukemia of B-cell type not having achieved remission: Secondary | ICD-10-CM

## 2020-10-02 DIAGNOSIS — E119 Type 2 diabetes mellitus without complications: Secondary | ICD-10-CM

## 2020-10-02 LAB — CBC WITH DIFFERENTIAL/PLATELET
Abs Immature Granulocytes: 0.02 10*3/uL (ref 0.00–0.07)
Basophils Absolute: 0 10*3/uL (ref 0.0–0.1)
Basophils Relative: 1 %
Eosinophils Absolute: 0 10*3/uL (ref 0.0–0.5)
Eosinophils Relative: 1 %
HCT: 35.9 % — ABNORMAL LOW (ref 36.0–46.0)
Hemoglobin: 11 g/dL — ABNORMAL LOW (ref 12.0–15.0)
Immature Granulocytes: 0 %
Lymphocytes Relative: 28 %
Lymphs Abs: 1.6 10*3/uL (ref 0.7–4.0)
MCH: 25.3 pg — ABNORMAL LOW (ref 26.0–34.0)
MCHC: 30.6 g/dL (ref 30.0–36.0)
MCV: 82.7 fL (ref 80.0–100.0)
Monocytes Absolute: 0.3 10*3/uL (ref 0.1–1.0)
Monocytes Relative: 5 %
Neutro Abs: 3.7 10*3/uL (ref 1.7–7.7)
Neutrophils Relative %: 65 %
Platelets: 306 10*3/uL (ref 150–400)
RBC: 4.34 MIL/uL (ref 3.87–5.11)
RDW: 16.7 % — ABNORMAL HIGH (ref 11.5–15.5)
WBC: 5.7 10*3/uL (ref 4.0–10.5)
nRBC: 0 % (ref 0.0–0.2)

## 2020-10-02 LAB — URINALYSIS, COMPLETE (UACMP) WITH MICROSCOPIC
Bacteria, UA: NONE SEEN
Bilirubin Urine: NEGATIVE
Glucose, UA: NEGATIVE mg/dL
Hgb urine dipstick: NEGATIVE
Ketones, ur: NEGATIVE mg/dL
Nitrite: NEGATIVE
Protein, ur: NEGATIVE mg/dL
Specific Gravity, Urine: 1.016 (ref 1.005–1.030)
pH: 5 (ref 5.0–8.0)

## 2020-10-02 LAB — HEPATIC FUNCTION PANEL
ALT: 10 U/L (ref 0–44)
AST: 32 U/L (ref 15–41)
Albumin: 4 g/dL (ref 3.5–5.0)
Alkaline Phosphatase: 77 U/L (ref 38–126)
Bilirubin, Direct: 0.1 mg/dL (ref 0.0–0.2)
Indirect Bilirubin: 0.5 mg/dL (ref 0.3–0.9)
Total Bilirubin: 0.6 mg/dL (ref 0.3–1.2)
Total Protein: 7.2 g/dL (ref 6.5–8.1)

## 2020-10-02 LAB — LIPID PANEL
Cholesterol: 91 mg/dL (ref 0–200)
HDL: 25 mg/dL — ABNORMAL LOW (ref >40)
LDL Cholesterol: 47 mg/dL (ref 0–99)
Total CHOL/HDL Ratio: 3.6 RATIO
Triglycerides: 96 mg/dL (ref <150)
VLDL: 19 mg/dL (ref 0–40)

## 2020-10-02 LAB — CREATININE, SERUM
Creatinine, Ser: 0.9 mg/dL (ref 0.44–1.00)
GFR, Estimated: >60 mL/min (ref >60)

## 2020-10-02 LAB — LACTIC ACID, PLASMA: Lactic Acid, Venous: 1.6 mmol/L (ref 0.5–1.9)

## 2020-10-02 LAB — LIPASE, BLOOD: Lipase: 77 U/L — ABNORMAL HIGH (ref 11–51)

## 2020-10-02 LAB — GLUCOSE, CAPILLARY
Glucose-Capillary: 85 mg/dL (ref 70–99)
Glucose-Capillary: 132 mg/dL — ABNORMAL HIGH (ref 70–99)

## 2020-10-02 LAB — TROPONIN I (HIGH SENSITIVITY)
Troponin I (High Sensitivity): 4 ng/L (ref <18)
Troponin I (High Sensitivity): 3 ng/L (ref <18)

## 2020-10-02 LAB — RESP PANEL BY RT-PCR (FLU A&B, COVID) ARPGX2
Influenza A by PCR: NEGATIVE
Influenza B by PCR: NEGATIVE
SARS Coronavirus 2 by RT PCR: NEGATIVE

## 2020-10-02 LAB — POC URINE PREG, ED: Preg Test, Ur: NEGATIVE

## 2020-10-02 IMAGING — US US CAROTID DUPLEX BILAT
1 series · 13 of 24 positions shown · non-contrast
Comparison: None.

CLINICAL DATA: Symptoms of transient ischemic attack

EXAM:
BILATERAL CAROTID DUPLEX ULTRASOUND
TECHNIQUE: Gray scale imaging, color Doppler and duplex ultrasound were
performed of bilateral carotid and vertebral arteries in the neck.

[Series 1: us carotid bilateral · 13 of 63 slices shown]
[im 1/63]
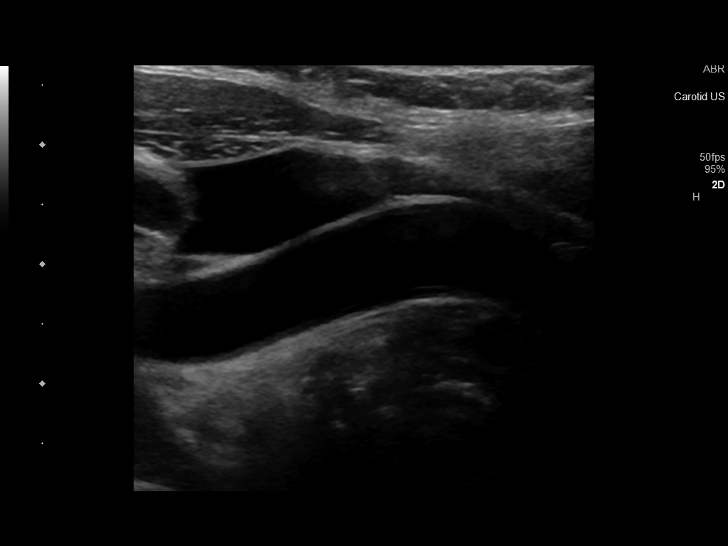
[im 6/63]
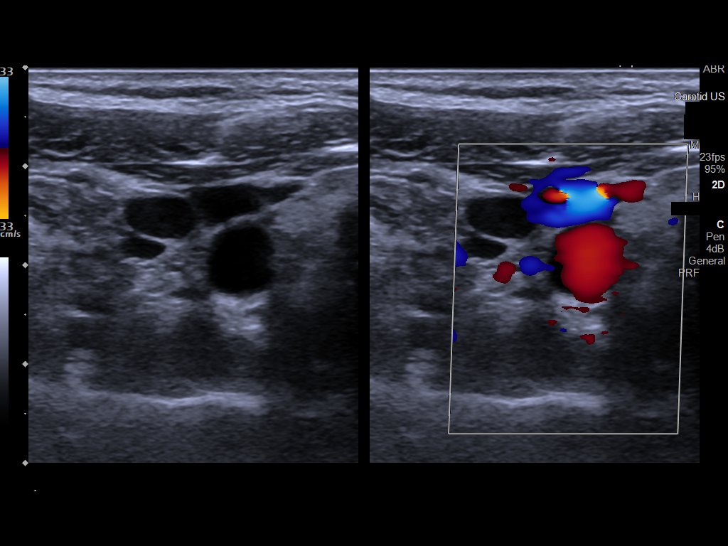
[im 11/63]
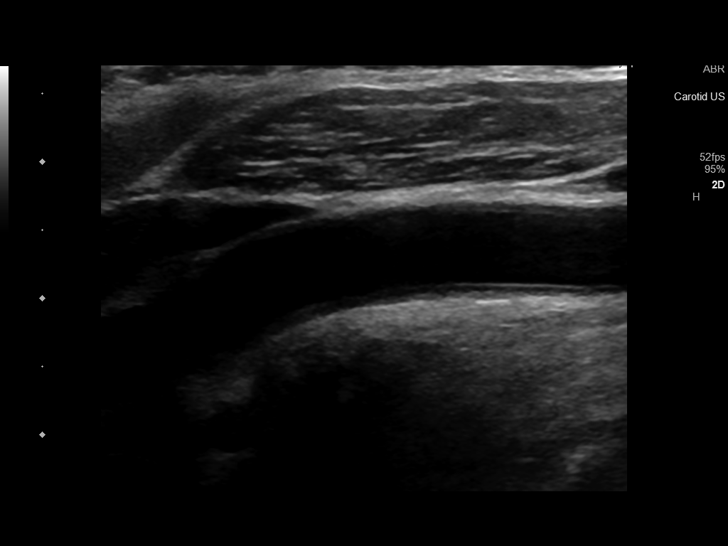
[im 17/63]
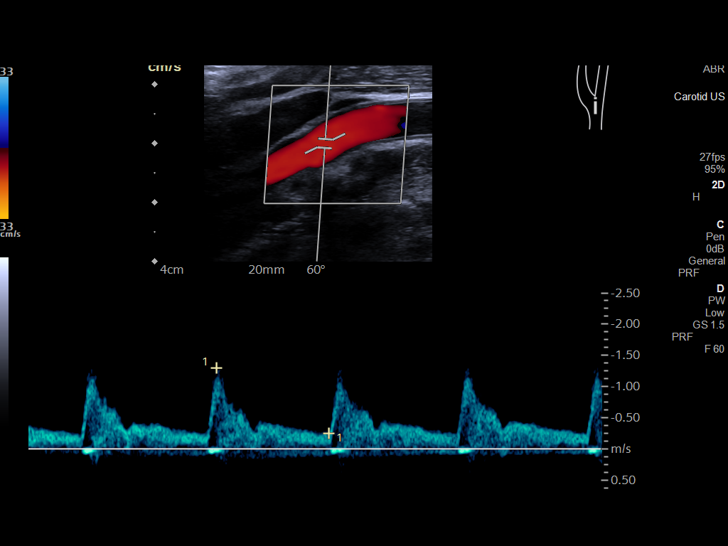
[im 22/63]
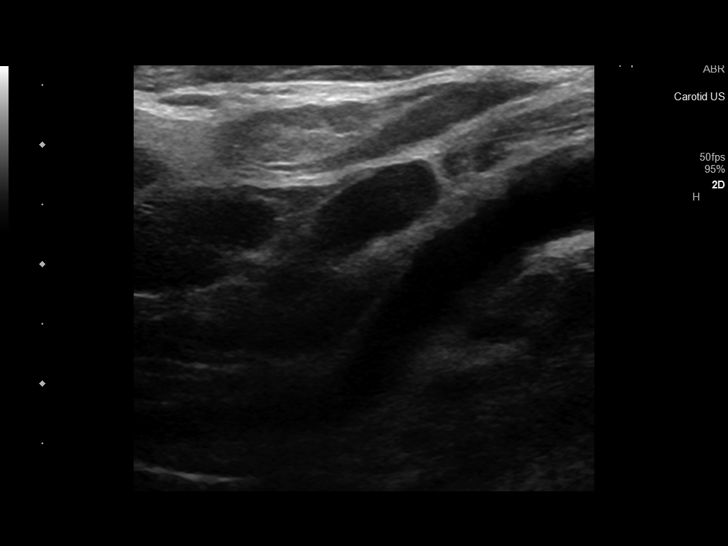
[im 27/63]
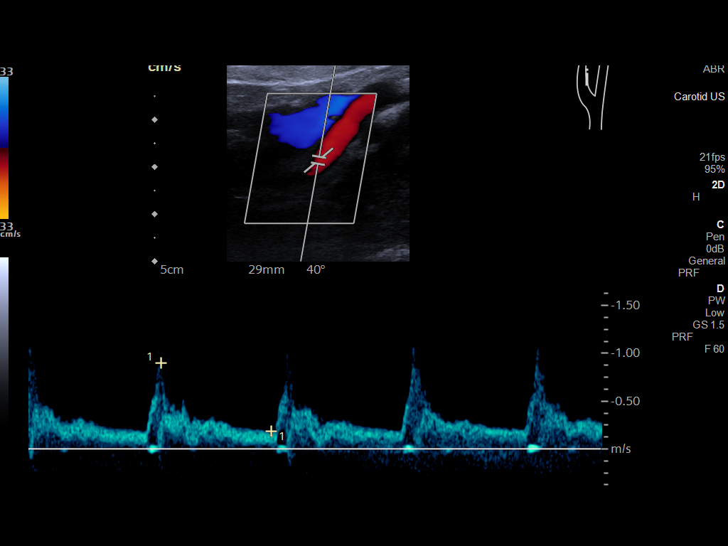
[im 33/63]
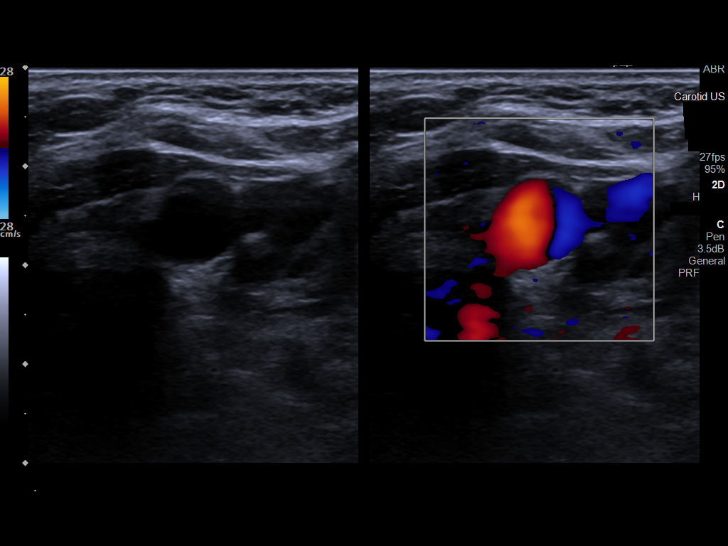
[im 36/63]
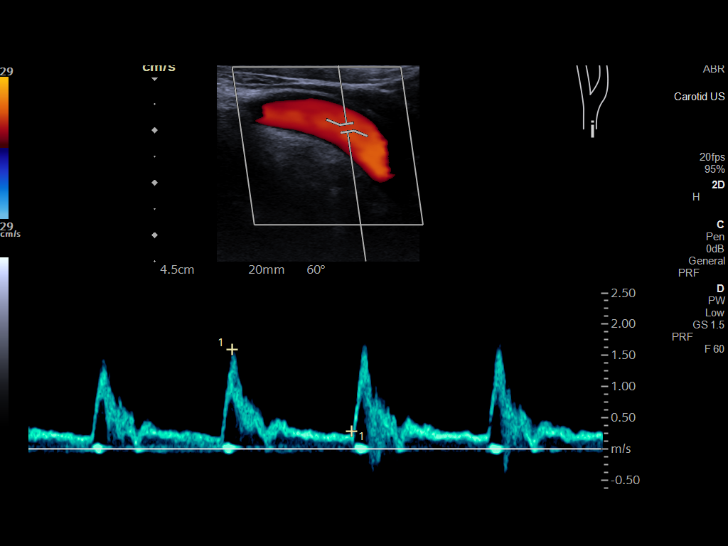
[im 41/63]
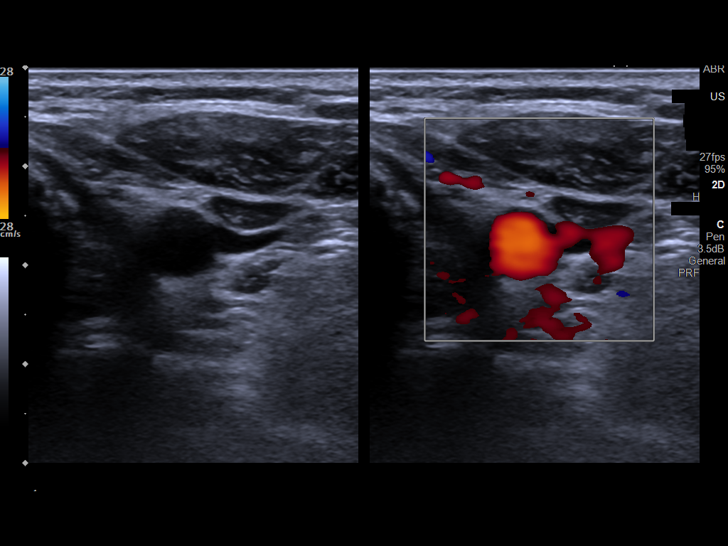
[im 46/63]
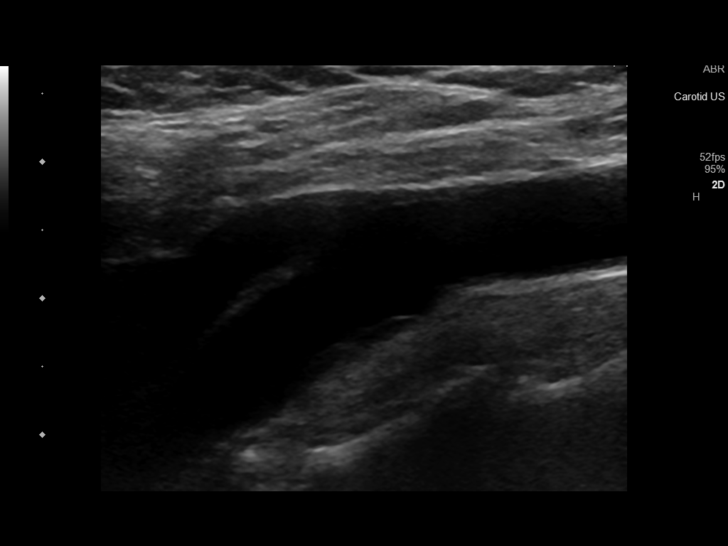
[im 52/63]
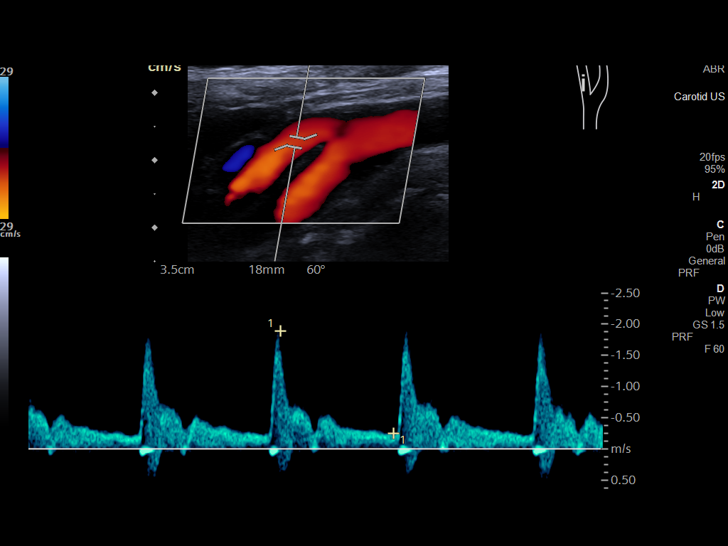
[im 57/63]
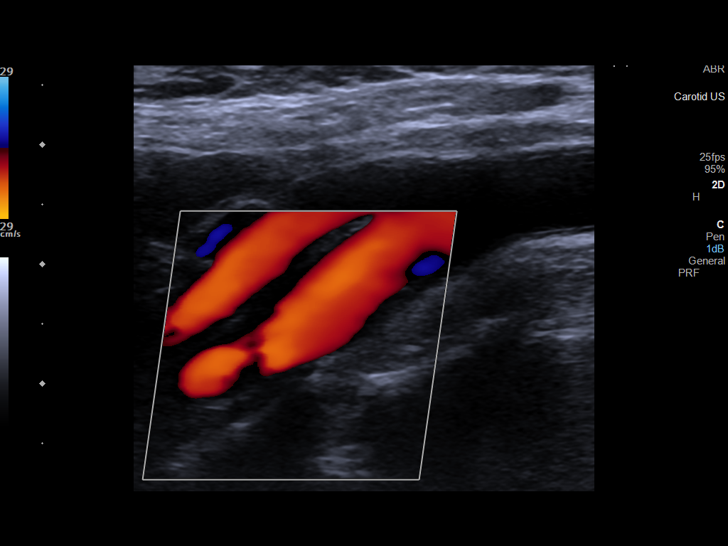
[im 63/63]
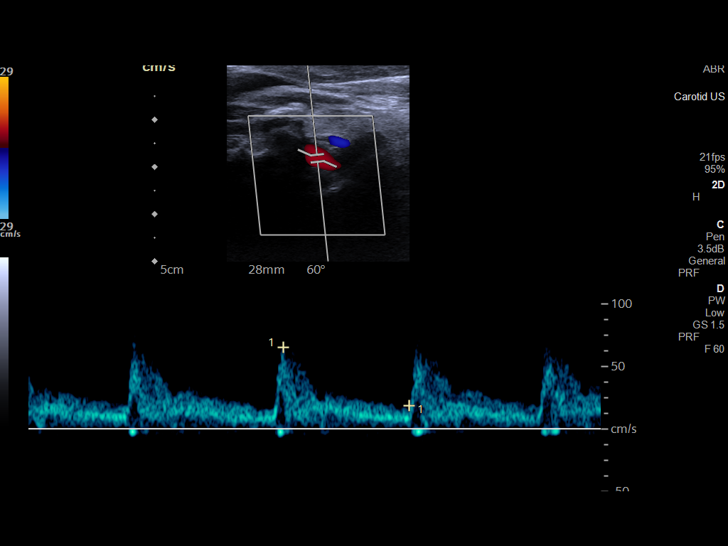

[13 of 24 positions shown; findings below may reference images not displayed]

FINDINGS: Criteria: Quantification of carotid stenosis is based on velocity
parameters that correlate the residual internal carotid diameter
with NASCET-based stenosis levels, using the diameter of the distal
internal carotid lumen as the denominator for stenosis measurement.

The following velocity measurements were obtained:

RIGHT
ICA: 90/19 cm/sec
CCA: 121/28 cm/sec

SYSTOLIC ICA/CCA RATIO:

ECA:  154 cm/sec

LEFT

ICA: 121/34 cm/sec

CCA: 108/27 cm/sec

SYSTOLIC ICA/CCA RATIO:

ECA:  189 cm/sec

RIGHT CAROTID ARTERY: No significant atherosclerotic plaque or
evidence of stenosis in the internal carotid artery.

RIGHT VERTEBRAL ARTERY:  Patent with normal antegrade flow.

LEFT CAROTID ARTERY: Mild smooth heterogeneous atherosclerotic
plaque in the proximal internal carotid artery. By peak systolic
velocity criteria, the estimated stenosis is less than 50%.

LEFT VERTEBRAL ARTERY:  Patent with antegrade flow.
IMPRESSION: 1. Mild (1-49%) stenosis proximal left internal carotid artery
secondary to smooth heterogeneous atherosclerotic plaque.
2. No significant atherosclerotic plaque or evidence of stenosis in
the right internal carotid artery.
3. Vertebral arteries are patent with normal antegrade flow.

## 2020-10-02 IMAGING — CT CT HEAD W/O CM
4 series · 17 of 47 positions shown, 19 images · non-contrast
Comparison: None.

CLINICAL DATA: Altered mental status, dysarthria

EXAM:
CT HEAD WITHOUT CONTRAST
TECHNIQUE: Contiguous axial images were obtained from the base of the skull
through the vertex without intravenous contrast.

[Series 2: head wo · axial · 0.41mm/px · z∈[-142,-37]mm · 7 of 29 slices shown, 9 images]
[im 4/29  brain]
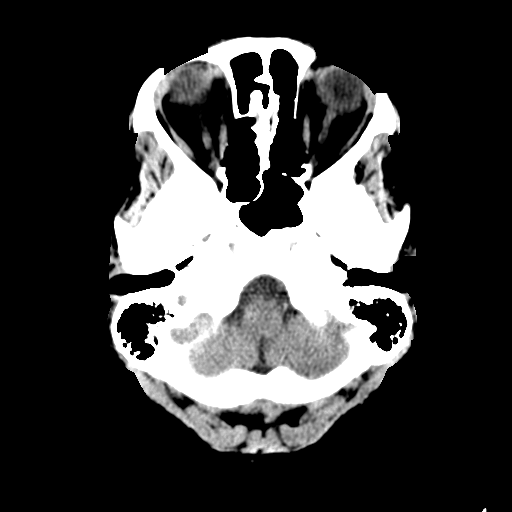
[im 4/29  bone]
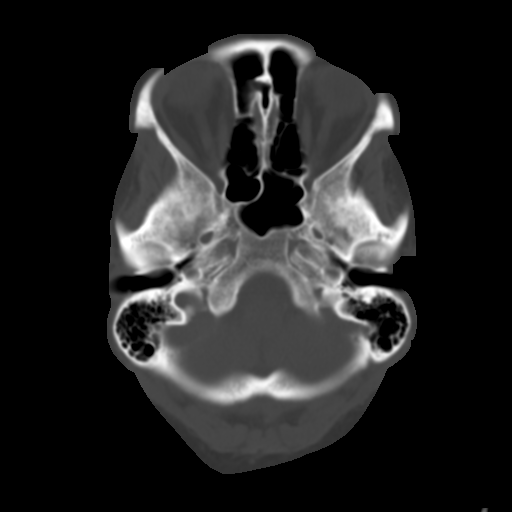
[im 8/29  brain]
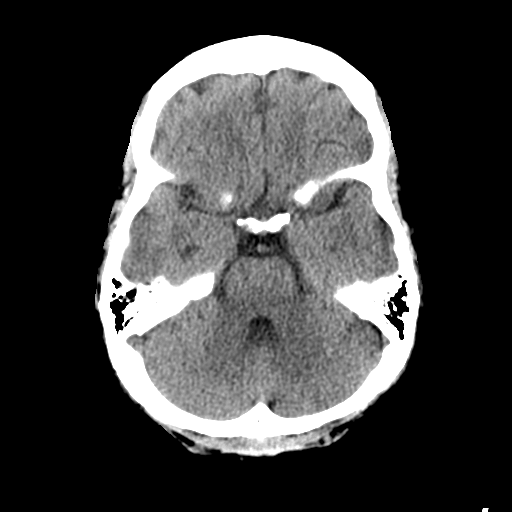
[im 11/29  brain]
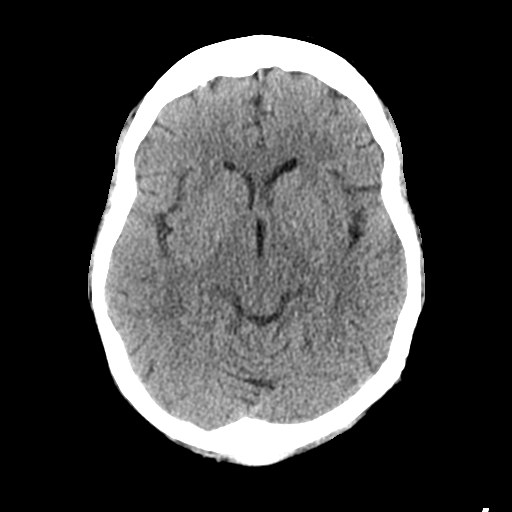
[im 15/29  brain]
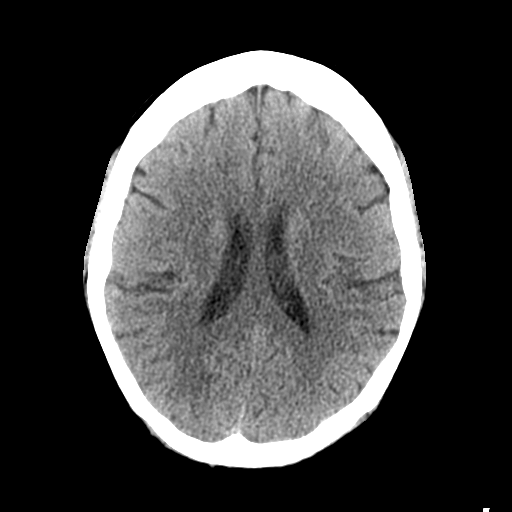
[im 18/29  brain]
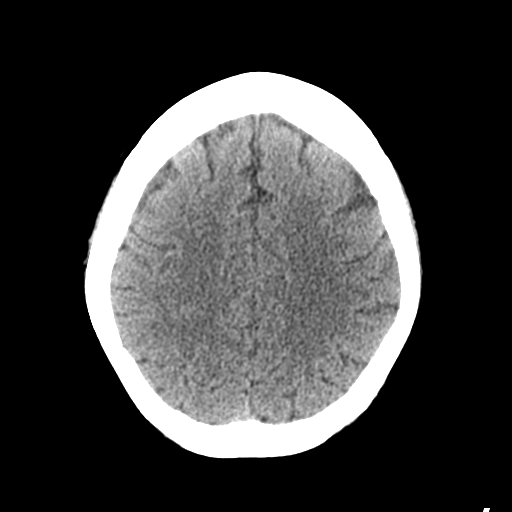
[im 18/29  bone]
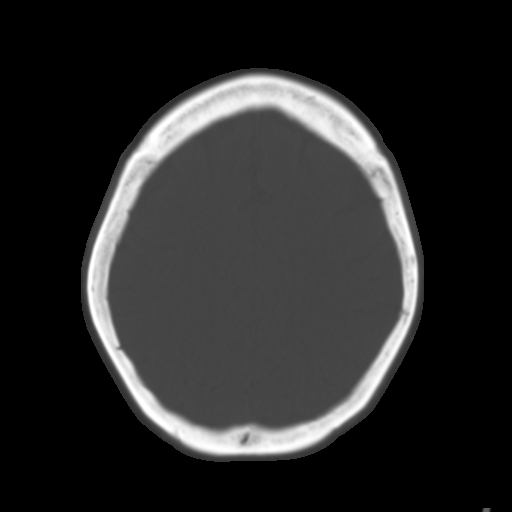
[im 22/29  brain]
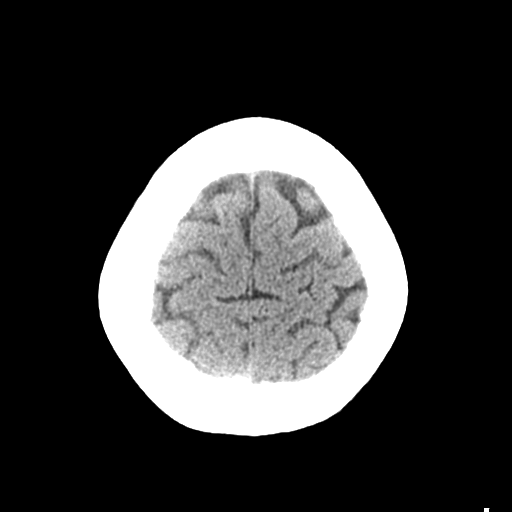
[im 25/29  brain]
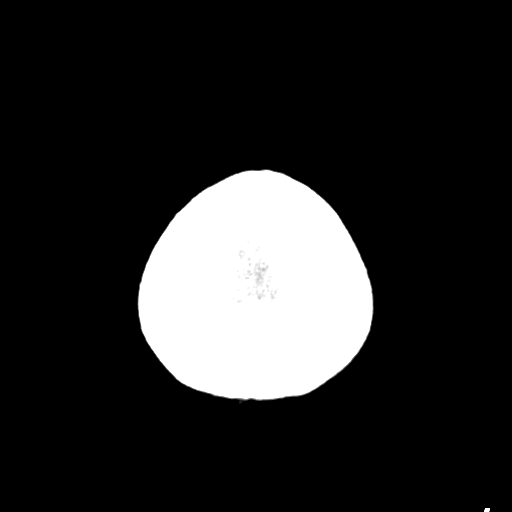

[Series 3: head bone · axial · 0.41mm/px · z∈[-143,-93]mm · 4 of 73 slices shown]
[im 8/73  bone]
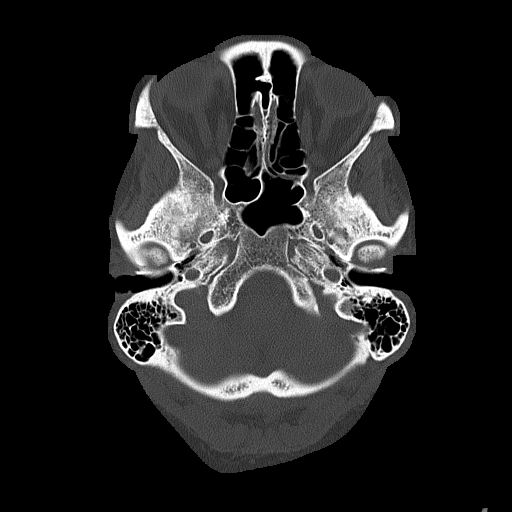
[im 15/73  bone]
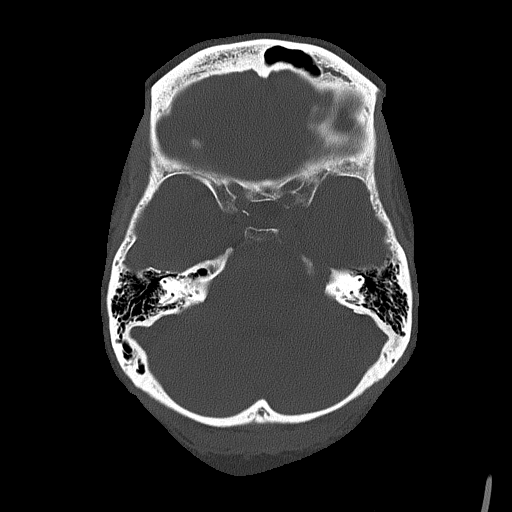
[im 22/73  bone]
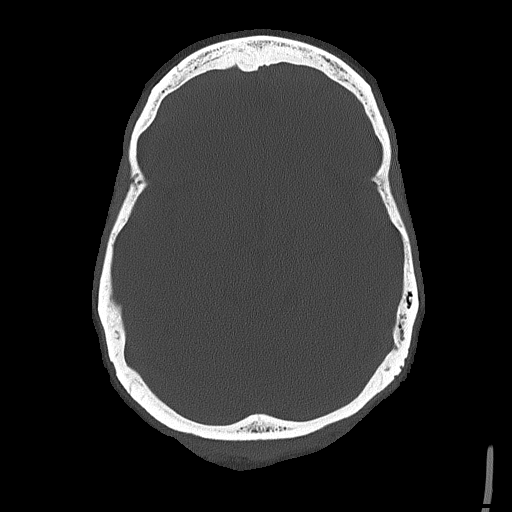
[im 33/73  bone]
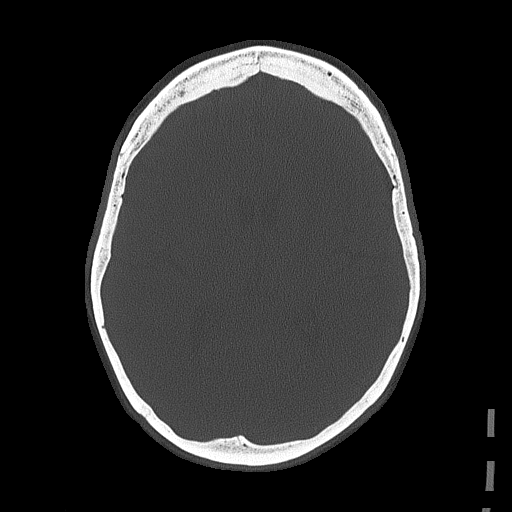

[Series 4: coronal soft tissue · coronal · 0.30mm/px · 3 of 62 slices shown]
[im 21/62  brain]
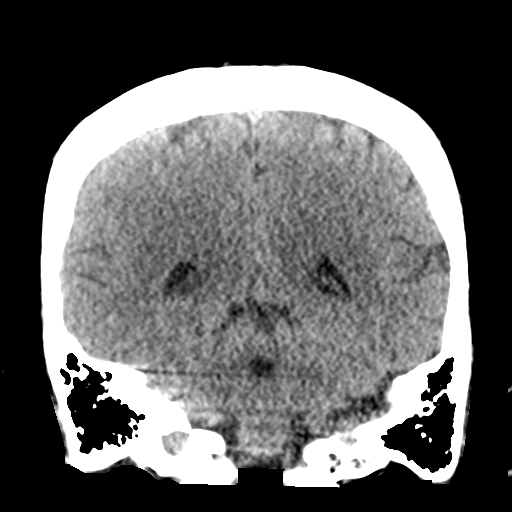
[im 28/62  brain]
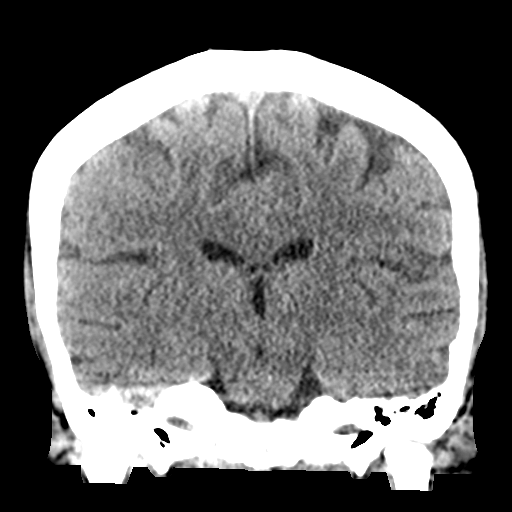
[im 34/62  brain]
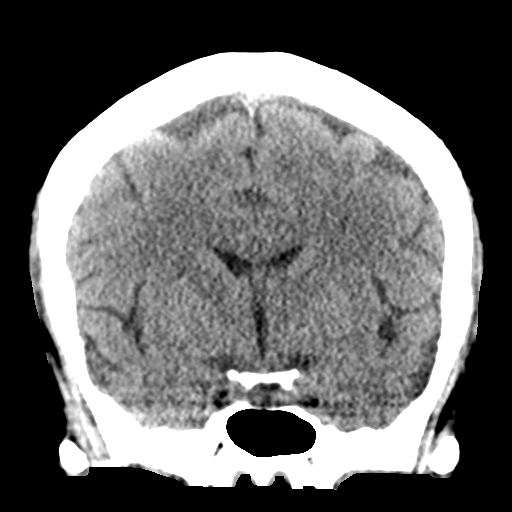

[Series 5: sagittal soft tissue · sagittal · 0.30mm/px · 3 of 51 slices shown]
[im 17/51  brain]
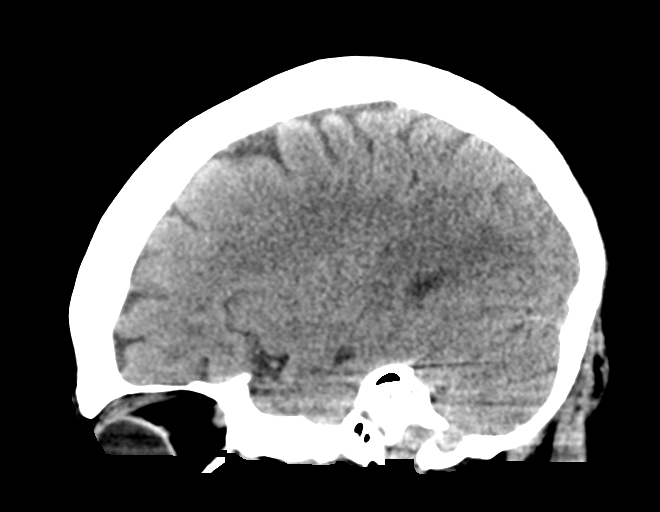
[im 26/51  brain]
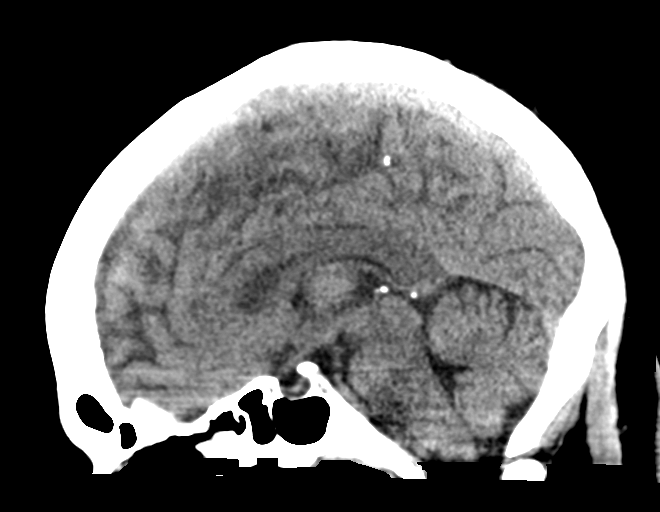
[im 34/51  brain]
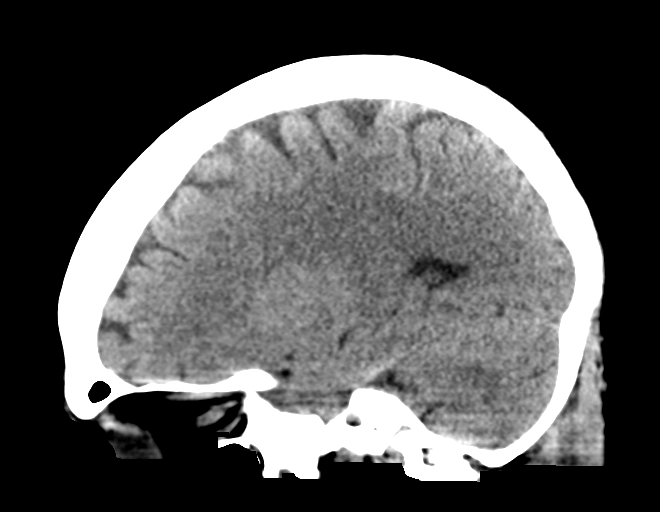

[17 of 47 positions shown; findings below may reference images not displayed]

FINDINGS: Brain: Normal anatomic configuration. No abnormal intra or
extra-axial mass lesion or fluid collection. No abnormal mass effect
or midline shift. No evidence of acute intracranial hemorrhage or
infarct. Ventricular size is normal. Cerebellum unremarkable.

Vascular: Unremarkable

Skull: Intact

Sinuses/Orbits: Paranasal sinuses are clear. Orbits are
unremarkable.

Other: Mastoid air cells and middle ear cavities are clear.
IMPRESSION: No acute intracranial abnormality.  Normal examination.

## 2020-10-02 MED ORDER — PANTOPRAZOLE SODIUM 40 MG PO TBEC
40.0000 mg | DELAYED_RELEASE_TABLET | Freq: Every day | ORAL | Status: DC
  Administered 2020-10-02: 40 mg via ORAL
  Filled 2020-10-02: qty 1

## 2020-10-02 MED ORDER — ACETAMINOPHEN 325 MG PO TABS
650.0000 mg | ORAL_TABLET | ORAL | Status: DC | PRN
  Administered 2020-10-02: 650 mg via ORAL
  Filled 2020-10-02: qty 2

## 2020-10-02 MED ORDER — ACETAMINOPHEN 160 MG/5ML PO SOLN
650.0000 mg | ORAL | Status: DC | PRN
  Filled 2020-10-02: qty 20.3

## 2020-10-02 MED ORDER — ROSUVASTATIN CALCIUM 10 MG PO TABS: 10.0000 mg | ORAL_TABLET | Freq: Every day | ORAL | Status: DC

## 2020-10-02 MED ORDER — ASPIRIN EC 81 MG PO TBEC: 81.0000 mg | DELAYED_RELEASE_TABLET | Freq: Every day | ORAL | 11 refills | Status: DC

## 2020-10-02 MED ORDER — LEVOTHYROXINE SODIUM 50 MCG PO TABS
150.0000 ug | ORAL_TABLET | Freq: Every day | ORAL | Status: DC
  Administered 2020-10-02: 150 ug via ORAL
  Filled 2020-10-02: qty 1

## 2020-10-02 MED ORDER — GUAIFENESIN-DM 100-10 MG/5ML PO SYRP
5.0000 mL | ORAL_SOLUTION | ORAL | Status: DC | PRN
  Administered 2020-10-02: 5 mL via ORAL
  Filled 2020-10-02: qty 5

## 2020-10-02 MED ORDER — MAGNESIUM OXIDE -MG SUPPLEMENT 400 (240 MG) MG PO TABS
400.0000 mg | ORAL_TABLET | Freq: Every day | ORAL | Status: DC
  Administered 2020-10-02: 400 mg via ORAL
  Filled 2020-10-02: qty 1

## 2020-10-02 MED ORDER — INSULIN ASPART 100 UNIT/ML IJ SOLN: 0.0000 [IU] | Freq: Every day | INTRAMUSCULAR | Status: DC

## 2020-10-02 MED ORDER — PREGABALIN 75 MG PO CAPS: 75.0000 mg | ORAL_CAPSULE | Freq: Every day | ORAL | Status: DC

## 2020-10-02 MED ORDER — METHOCARBAMOL 500 MG PO TABS
500.0000 mg | ORAL_TABLET | Freq: Three times a day (TID) | ORAL | Status: DC
  Administered 2020-10-02 (×2): 500 mg via ORAL
  Filled 2020-10-02 (×3): qty 1

## 2020-10-02 MED ORDER — ACETAMINOPHEN 650 MG RE SUPP: 650.0000 mg | RECTAL | Status: DC | PRN

## 2020-10-02 MED ORDER — STROKE: EARLY STAGES OF RECOVERY BOOK: Freq: Once | Status: DC

## 2020-10-02 MED ORDER — VALACYCLOVIR HCL 500 MG PO TABS
1000.0000 mg | ORAL_TABLET | Freq: Two times a day (BID) | ORAL | Status: DC
  Administered 2020-10-02: 1000 mg via ORAL
  Filled 2020-10-02 (×2): qty 2

## 2020-10-02 MED ORDER — INSULIN ASPART 100 UNIT/ML IJ SOLN
0.0000 [IU] | Freq: Three times a day (TID) | INTRAMUSCULAR | Status: DC
  Administered 2020-10-02: 2 [IU] via SUBCUTANEOUS
  Filled 2020-10-02: qty 1

## 2020-10-02 MED ORDER — MONTELUKAST SODIUM 10 MG PO TABS: 10.0000 mg | ORAL_TABLET | Freq: Every day | ORAL | Status: DC

## 2020-10-02 MED ORDER — EZETIMIBE 10 MG PO TABS
10.0000 mg | ORAL_TABLET | Freq: Every day | ORAL | Status: DC
  Administered 2020-10-02: 10 mg via ORAL
  Filled 2020-10-02: qty 1

## 2020-10-02 MED ORDER — SODIUM CHLORIDE 0.9 % IV BOLUS
1000.0000 mL | Freq: Once | INTRAVENOUS | Status: AC
  Administered 2020-10-02: 1000 mL via INTRAVENOUS

## 2020-10-02 MED ORDER — DICLOFENAC SODIUM 1 % EX GEL
2.0000 g | Freq: Four times a day (QID) | CUTANEOUS | Status: DC
  Filled 2020-10-02: qty 100

## 2020-10-02 MED ORDER — ENOXAPARIN SODIUM 60 MG/0.6ML IJ SOSY
0.5000 mg/kg | PREFILLED_SYRINGE | INTRAMUSCULAR | Status: DC
  Administered 2020-10-02: 52.5 mg via SUBCUTANEOUS
  Filled 2020-10-02 (×2): qty 0.53

## 2020-10-02 NOTE — Evaluation (Signed)
Physical Therapy Evaluation Patient Details Name: Tonya Myers MRN: [id number] DOB: 1965/12/12 Today's Date: 10/02/2020   History of Present Illness  presented to ER secondary to acute onset of stuttering speech, hypotension; admitted for management of acute focal neurological deficit, TIA/CVA work up.  CT head negative for acute change; MRI, dopplers pending.  Clinical Impression  Patient resting in bed, husband at bedside, upon arrival to room.  Alert and oriented to all information; eager to participate and progress with therapy session.  Denies pain at this time.  Generalized weakness noted throughout all extremities (R UE > L UE, bilat LEs), with noted compensatory strategies R shoulder/scapulae.  Does endorse generalized sensory deficits (R UE > L UE, bilat LEs) with accurate awareness of deep pressure, but inconsistent awareness of light touch (often relying on visual input to optimize awareness).  Currently able to complete bed mobility with mod indep (using bilat UEs to self-assist as needed); scooting along edge of bed, sup/mod indep; sit/stand and forward/backward stepping with RW, min assist +2 for safety.  Demonstrates slightly ataxic foot placement with inconsistent step height/length; mild recurvatum in LE loading to maximize stabilization; mod reliance on RW for support. Would benefit from skilled PT to address above deficits and promote optimal return to PLOF.; Recommend transition to HHPT upon discharge from acute hospitalization.  Would benefit from further progression to outpatient as patient comfortable/ready (prefers to remain in [facility] services at this time).     Follow Up Recommendations Home health PT (recommend transition to outpatient as able (patient preferring to remain [place] at this time))    Equipment Recommendations       Recommendations for Other Services       Precautions / Restrictions Precautions Precautions: Fall Restrictions Weight Bearing Restrictions: No       Mobility  Bed Mobility Overal bed mobility: Modified Independent             General bed mobility comments: self-assists LEs in/out of bed using bilat UEs as needed; scoots self up in bed using bedrails    Transfers Overall transfer level: Needs assistance Equipment used: Rolling walker (2 wheeled) Transfers: Sit to/from Stand Sit to Stand: Min assist;+2 safety/equipment;From elevated surface         General transfer comment: very heavy use of UEs to assist with lift off and anterior weight translation; fair/good stabilization of bilat hips/kness once upright.  Able to maintain static stance without UE support for brief periods (though noted deficits in balance reactions)  Ambulation/Gait Ambulation/Gait assistance: Min assist;+2 safety/equipment Gait Distance (Feet):  (5' x2) Assistive device: Rolling walker (2 wheeled)       General Gait Details: slightly ataxic foot placement with inconsistent step height/length; mild recurvatum in LE loading to maximize stabilization; mod reliance on RW for support.  Stairs            Wheelchair Mobility    Modified Rankin (Stroke Patients Only)       Balance Overall balance assessment: Needs assistance Sitting-balance support: No upper extremity supported;Feet supported Sitting balance-Leahy Scale: Good     Standing balance support: Bilateral upper extremity supported Standing balance-Leahy Scale: Fair                               Pertinent Vitals/Pain Pain Assessment: No/denies pain    Home Living Family/patient expects to be discharged to:: Private residence Living Arrangements: Spouse/significant other Available Help at Discharge: Family;Available 24 hours/day Type of  Home: House Home Access: Level entry     Home Layout: Two level;Able to live on main level with bedroom/bathroom Home Equipment: Shower seat;Grab bars - toilet;Walker - 2 wheels;Wheelchair - Education officer, community -  power;Bedside commode;Hospital bed (sliding board) Additional Comments: Living on main level of home with hospital bed; no essential needs on upper level of home    Prior Function Level of Independence: Independent with assistive device(s)         Comments: At recent baseline, patient WC level as primary mobility, performing SB/squat pivot transfers between surfaces; no physical assist required.  Able to complete toileting, hygiene mod indep; does require some/min assist for bathing at times.  Has been active with HHPT/OT 2x/week, working on standing/stepping as appropriate.  Denies fall history.     Hand Dominance   Dominant Hand: Right    Extremity/Trunk Assessment   Upper Extremity Assessment Upper Extremity Assessment: Generalized weakness (R UE grossly 3-/5, significant compensatory strategies R shoulder (with noted forward scapular protraction); L UE grossly 4-/5 throughout.  Inconsistent awareness of light touch bilat; fair/good awareness of deep pressure)    Lower Extremity Assessment Lower Extremity Assessment: Generalized weakness (bilat hips grossly 2+ to 3-/5; knees and ankles grossly 3/5)       Communication   Communication: No difficulties  Cognition Arousal/Alertness: Awake/alert Behavior During Therapy: WFL for tasks assessed/performed Overall Cognitive Status: Within Functional Limits for tasks assessed                                 General Comments: very eager, motivatd to participate and progress with therapy; very proud of her progress thus far      General Comments      Exercises     Assessment/Plan    PT Assessment Patient needs continued PT services  PT Problem List Decreased strength;Decreased range of motion;Decreased activity tolerance;Decreased balance;Decreased mobility;Decreased coordination;Decreased knowledge of use of DME;Decreased safety awareness;Decreased knowledge of precautions;Impaired sensation       PT Treatment  Interventions DME instruction;Gait training;Stair training;Functional mobility training;Therapeutic activities;Therapeutic exercise;Balance training;Patient/family education;Neuromuscular re-education    PT Goals (Current goals can be found in the Care Plan section)  Acute Rehab PT Goals Patient Stated Goal: to keep working and make this my past PT Goal Formulation: With patient/family Time For Goal Achievement: 10/16/20 Potential to Achieve Goals: Good    Frequency Min 2X/week   Barriers to discharge        Co-evaluation PT/OT/SLP Co-Evaluation/Treatment: Yes Reason for Co-Treatment: Complexity of the patient's impairments (multi-system involvement);For patient/therapist safety;To address functional/ADL transfers PT goals addressed during session: Mobility/safety with mobility OT goals addressed during session: ADL's and self-care       AM-PAC PT "6 Clicks" Mobility  Outcome Measure Help needed turning from your back to your side while in a flat bed without using bedrails?: None Help needed moving from lying on your back to sitting on the side of a flat bed without using bedrails?: None Help needed moving to and from a bed to a chair (including a wheelchair)?: A Little Help needed standing up from a chair using your arms (e.g., wheelchair or bedside chair)?: A Little Help needed to walk in hospital room?: A Little Help needed climbing 3-5 steps with a railing? : A Lot 6 Click Score: 19    End of Session Equipment Utilized During Treatment: Gait belt Activity Tolerance: Patient tolerated treatment well Patient left: in bed;with call bell/phone within  reach;with bed alarm set Nurse Communication: Mobility status PT Visit Diagnosis: Muscle weakness (generalized) (M62.81);Difficulty in walking, not elsewhere classified (R26.2);Other symptoms and signs involving the nervous system (R29.898)    Time: 9470-9628 PT Time Calculation (min) (ACUTE ONLY): 27 min   Charges:   PT  Evaluation $PT Eval Moderate Complexity: 1 Mod PT Treatments $Therapeutic Activity: 8-22 mins        Jandy Brackens H. Owens Shark, PT, DPT, NCS 10/02/20, 10:36 AM [phone]

## 2020-10-02 NOTE — TOC Initial Note (Signed)
Transition of Care [facility]) - Initial/Assessment Note    Patient Details  Name: Tonya Myers MRN: [id number] Date of Birth: 1965/10/09  [facility] [name] Contact:    Alberteen Sam, [name] Phone Number: 10/02/2020, 11:43 AM  Clinical Narrative:                  CSW spoke with patient regarding PT recommendation of Home Health services, patient reports being active with [facility] and would like to resume services at discharge. CSW has informed [name] with [facility] of patient's hospital admission and will keep up to date regarding discharge time frame.   Patient reports she has a rolling walker at home and reports no DME needs at discharge at this time.    Expected Discharge Plan: [place] Barriers to Discharge: Continued Medical Work up   Patient Goals and CMS Choice Patient states their goals for this hospitalization and ongoing recovery are:: to go home CMS Medicare.gov Compare Post Acute Care list provided to:: Patient Choice offered to / list presented to : Patient  Expected Discharge Plan and Services Expected Discharge Plan: [place] Agency: [facility] ([place]) Date [facility] Agency Contacted: 10/02/20 Time HH Agency Contacted: 20 Representative spoke with at [facility]: [name]  Prior Living Arrangements/Services   Lives with:: Spouse   Do you feel safe going back to the place where you live?: Yes      Need for Family Participation in Patient Care: Yes (Comment) Care giver support system in place?: Yes (comment)      Activities of Daily Living   ADL Screening (condition at time of admission) Is the patient deaf or have difficulty hearing?: No Does the patient have difficulty seeing, even when wearing glasses/contacts?: No Does the patient have difficulty concentrating, remembering, or making decisions?: No Does the patient have difficulty dressing or  bathing?: No Does the patient have difficulty walking or climbing stairs?: Yes  Permission Sought/Granted Permission sought to share information with : Case Manager, Investment banker, corporate granted to share info w AGENCY: [facility]        Emotional Assessment   Attitude/Demeanor/Rapport: Gracious Affect (typically observed): Calm Orientation: : Oriented to Self, Oriented to Place, Oriented to  Time, Oriented to Situation Alcohol / Substance Use: Not Applicable Psych Involvement: No (comment)  Admission diagnosis:  TIA (transient ischemic attack) [G45.9] Weakness [R53.1] Acute focal neurological deficit [R29.818] Speech disturbance, unspecified type [R47.9] Patient Active Problem List   Diagnosis Date Noted   Acute focal neurological deficit 10/02/2020   Functional paraparesis 10/02/2020   Left upper quadrant abdominal pain    Pain    Acute pain of right knee    Hypokalemia    Urinary retention    Slow transit constipation    Meningitis    Labile blood glucose    Uncontrolled type 2 diabetes mellitus with hyperosmolar nonketotic hyperglycemia (HCC)    Transaminitis    Sleep disturbance    Anemia of chronic disease    Bacterial spinal epidural abscess 06/27/2020   Obesity (BMI 30.0-34.9)    Hypomagnesemia    Hypernatremia    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HCC)    Epidural abscess    Pneumococcal meningitis  Aphasia 06/10/2020   Hyperglycemic crisis in diabetes mellitus ([place]) 00/51/1021   Acute metabolic encephalopathy 11/73/5670   Severe sepsis ([place])    Diabetic ketoacidosis without coma associated with type 2 diabetes mellitus ([place])    AKI (acute kidney injury) ([place])    Elevated LFTs    Acute encephalopathy 06/09/2020   Bacterial vaginosis 11/01/2016   Chronic pain in right shoulder 03/23/2016   Numbness and tingling 03/23/2016   Uncontrolled type 2 diabetes mellitus with hyperglycemia, without long-term current use of insulin ([name]) 10/27/2015    DDD (degenerative disc disease), cervical 07/29/2015   Cervical disc disorder with radiculopathy of cervical region 07/29/2015   Cervical facet syndrome 07/29/2015   DDD (degenerative disc disease), lumbar 07/29/2015   Facet syndrome, lumbar 07/29/2015   Bilateral occipital neuralgia 07/29/2015   Sacroiliac joint dysfunction 07/29/2015   DJD of shoulder 07/29/2015   Herpes simplex vulvovaginitis 05/29/2015   Acute hemorrhoid 03/11/2015   CLL (chronic lymphocytic leukemia) ([place]) 01/15/2015   Hypothyroidism 12/26/2014   Arthritis 12/26/2014   Carpal tunnel syndrome, bilateral 12/26/2014   Controlled type 2 diabetes mellitus without complication, without long-term current use of insulin ([name]) 12/26/2014   Essential hypertension 12/26/2014   Hyperlipidemia, mixed 12/26/2014   PCP:  [name], MD Pharmacy:   [id number] - [place], [place] - [name] [place] [address] Phone: [phone] Fax: [phone]     Social Determinants of Health (SDOH) Interventions    Readmission Risk Interventions No flowsheet data found.

## 2020-10-02 NOTE — ED Notes (Signed)
Patient is resting comfortably. 

## 2020-10-02 NOTE — Evaluation (Signed)
Occupational Therapy Evaluation Patient Details Name: Tonya Myers MRN: [id number] DOB: Jun 26, 1965 Today's Date: 10/02/2020    History of Present Illness presented to ER secondary to acute onset of stuttering speech, hypotension; admitted for management of acute focal neurological deficit, TIA/CVA work up.  CT head negative for acute change; MRI, dopplers pending.   Clinical Impression   Pt was seen for OT evaluation and co-tx with PT to address ADL transfers/mobility this date. Pt eager to participate in order to continue to progress towards independence. Pt reports having OT and PT at home prior to this admission. Currently pt demonstrates impairments as described below (See OT problem list) which functionally limit her ability to perform ADL/self-care tasks. Pt currently performs bed mobility with modified independence, donned LB clothing with modified independence in sitting, MIN A +2 for safety for ADL transfers EOB with RW and to take a couple steps forward/backward/laterally. Pt demo's generalized weakness through BUE (RUE>LUE) and BLE with noted compensatory strategies for R Shoulder/scapulae. Impaired light touch noted (RUE>LUE, BLE). Pt would benefit from skilled OT services to address noted impairments and functional limitations (see below for any additional details) in order to maximize safety and independence while minimizing falls risk and caregiver burden. Upon hospital discharge, recommend HHOT to maximize pt safety and return to functional independence. Would benefit from further progression to outpatient as patient comfortable/ready (prefers to remain in [facility] services at this time).    Follow Up Recommendations  Home health OT    Equipment Recommendations  None recommended by OT    Recommendations for Other Services       Precautions / Restrictions Precautions Precautions: Fall Restrictions Weight Bearing Restrictions: No      Mobility Bed Mobility Overal bed mobility:  Modified Independent             General bed mobility comments: self-assists LEs in/out of bed using bilat UEs as needed; scoots self up in bed using bedrails    Transfers Overall transfer level: Needs assistance Equipment used: Rolling walker (2 wheeled) Transfers: Sit to/from Stand Sit to Stand: Min assist;+2 safety/equipment;From elevated surface         General transfer comment: very heavy use of UEs to assist with lift off and anterior weight translation; fair/good stabilization of bilat hips/kness once upright.  Able to maintain static stance without UE support for brief periods (though noted deficits in balance reactions)    Balance Overall balance assessment: Needs assistance Sitting-balance support: No upper extremity supported;Feet supported Sitting balance-Leahy Scale: Good     Standing balance support: Bilateral upper extremity supported Standing balance-Leahy Scale: Fair                             ADL either performed or assessed with clinical judgement   ADL Overall ADL's : Needs assistance/impaired                     Lower Body Dressing: Sitting/lateral leans;Supervision/safety;Set up               Functional mobility during ADLs: Minimal assistance;+2 for safety/equipment;Rolling walker       Vision Patient Visual Report: No change from baseline       Perception     Praxis      Pertinent Vitals/Pain Pain Assessment: No/denies pain     Hand Dominance Right   Extremity/Trunk Assessment Upper Extremity Assessment Upper Extremity Assessment: Generalized weakness (R UE grossly 3-/5, significant  compensatory strategies R shoulder (with noted forward scapular protraction); L UE grossly 4-/5 throughout. Inconsistent awareness of light touch bilat; fair/good awareness of deep pressure)   Lower Extremity Assessment Lower Extremity Assessment: Generalized weakness (bilat hips grossly 2+ to 3-/5; knees and ankles grossly  3/5)       Communication Communication Communication: No difficulties   Cognition Arousal/Alertness: Awake/alert Behavior During Therapy: WFL for tasks assessed/performed Overall Cognitive Status: Within Functional Limits for tasks assessed                                 General Comments: very eager, motivatd to participate and progress with therapy; very proud of her progress thus far   General Comments       Exercises     Shoulder Instructions      Home Living Family/patient expects to be discharged to:: Private residence Living Arrangements: Spouse/significant other Available Help at Discharge: Family;Available 24 hours/day Type of Home: House Home Access: Level entry     Home Layout: Two level;Able to live on main level with bedroom/bathroom Alternate Level Stairs-Number of Steps: 16   Bathroom Shower/Tub: Occupational psychologist: Standard Bathroom Accessibility: Yes How Accessible: Accessible via walker Home Equipment: Shower seat;Grab bars - toilet;Walker - 2 wheels;Wheelchair - Education officer, community - power;Bedside commode;Hospital bed   Additional Comments: Living on main level of home with hospital bed; no essential needs on upper level of home      Prior Functioning/Environment Level of Independence: Independent with assistive device(s)        Comments: At recent baseline, patient WC level as primary mobility, performing SB/squat pivot transfers between surfaces; no physical assist required.  Able to complete toileting, hygiene mod indep; does require some/min assist for bathing at times.  Has been active with HHPT/OT 2x/week, working on standing/stepping as appropriate.  Denies fall history.        OT Problem List: Decreased strength;Decreased coordination;Impaired sensation;Decreased activity tolerance;Impaired balance (sitting and/or standing);Decreased knowledge of use of DME or AE;Impaired UE functional use      OT  Treatment/Interventions: Self-care/ADL training;Therapeutic exercise;Therapeutic activities;Neuromuscular education;DME and/or AE instruction;Patient/family education;Balance training    OT Goals(Current goals can be found in the care plan section) Acute Rehab OT Goals Patient Stated Goal: to keep working and make this my past OT Goal Formulation: With patient/family Time For Goal Achievement: 10/16/20 Potential to Achieve Goals: Good ADL Goals Pt Will Transfer to Toilet: with supervision;ambulating (BSC over toilet, LRAD for amb) Pt/caregiver will Perform Home Exercise Program: Increased strength;Both right and left upper extremity;With theraband;With written HEP provided Additional ADL Goal #1: Pt will perform seated bath with set up and remote supervision.  OT Frequency: Min 2X/week   Barriers to D/C:            Co-evaluation PT/OT/SLP Co-Evaluation/Treatment: Yes Reason for Co-Treatment: Complexity of the patient's impairments (multi-system involvement);For patient/therapist safety;To address functional/ADL transfers PT goals addressed during session: Mobility/safety with mobility OT goals addressed during session: ADL's and self-care      AM-PAC OT "6 Clicks" Daily Activity     Outcome Measure Help from another person eating meals?: None Help from another person taking care of personal grooming?: None Help from another person toileting, which includes using toliet, bedpan, or urinal?: A Little Help from another person bathing (including washing, rinsing, drying)?: A Little Help from another person to put on and taking off regular upper body clothing?: None Help  from another person to put on and taking off regular lower body clothing?: A Little 6 Click Score: 21   End of Session Equipment Utilized During Treatment: Gait belt;Rolling walker  Activity Tolerance: Patient tolerated treatment well Patient left: in bed;with call bell/phone within reach;with family/visitor  present;with bed alarm set  OT Visit Diagnosis: Other abnormalities of gait and mobility (R26.89)                Time: 5364-6803 OT Time Calculation (min): 23 min Charges:  OT General Charges $OT Visit: 1 Visit OT Evaluation $OT Eval Moderate Complexity: 1 Mod  Hanley Hays, MPH, MS, OTR/L ascom [phone] 10/02/20, 2:42 PM

## 2020-10-02 NOTE — ED Provider Notes (Signed)
[facility] Emergency Department Provider Note   ____________________________________________   Event Date/Time   First MD Initiated Contact with Patient 10/01/20 2329     (approximate)  I have reviewed the triage vital signs and the nursing notes.   HISTORY  Chief Complaint Hypotension and Speech Problem    HPI Tonya Myers is a 55 y.o. female who presents to the ED from home with a chief complaint of hypotension, stuttering speech, difficulty getting her words out.  Patient with a past medical history of CLL, diabetes with DKA, hypertension who first called into her PCP on 06/04/2020 for concerns of struggling to speak and difficulty putting words together.  Subsequently she complained of severe back pain.  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  She was then admitted to inpatient rehab due to decreased functional ability.  Has not been able to walk since that time and is currently doing physical therapy.  While she was in physical therapy today her blood pressure was noted to be in the 16X systolic.  Patient states she began to have stuttering speech and difficulty getting her words out 4 days ago.  Also endorses right lower back pain.  Last took ibuprofen at 6 PM for back pain.  Denies fever, chills, cough, chest pain, shortness of breath, abdominal pain, nausea, vomiting or diarrhea.     Past Medical History:  Diagnosis Date   Acute hemorrhoid 03/11/2015   Anxiety    Arthritis    Asthma    Cancer (HCC)    Chronic back pain    CLL (chronic lymphocytic leukemia) ([place])    Depression    Diabetes mellitus without complication ([place])    Genital herpes    type 2   Hypertension    Hypothyroidism    Vertigo     Patient Active Problem List   Diagnosis Date Noted   Acute focal  neurological deficit 10/02/2020   Functional paraparesis 10/02/2020   Left upper quadrant abdominal pain    Pain    Acute pain of right knee    Hypokalemia    Urinary retention    Slow transit constipation    Meningitis    Labile blood glucose    Uncontrolled type 2 diabetes mellitus with hyperosmolar nonketotic hyperglycemia (HCC)    Transaminitis    Sleep disturbance    Anemia of chronic disease    Bacterial spinal epidural abscess 06/27/2020   Obesity (BMI 30.0-34.9)    Hypomagnesemia    Hypernatremia    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HCC)    Epidural abscess    Pneumococcal meningitis    Aphasia 06/10/2020   Hyperglycemic crisis in diabetes mellitus ([place]) 09/60/4540   Acute metabolic encephalopathy 98/02/9146   Severe sepsis ([place])    Diabetic ketoacidosis without coma associated with type 2 diabetes mellitus ([place])    AKI (acute kidney injury) ([place])    Elevated LFTs    Acute encephalopathy 06/09/2020   Bacterial vaginosis 11/01/2016   Chronic pain in right shoulder 03/23/2016   Numbness and tingling 03/23/2016   Uncontrolled type 2 diabetes mellitus with hyperglycemia, without long-term current use of insulin ([place]) 10/27/2015   DDD (degenerative disc disease), cervical 07/29/2015   Cervical disc disorder with radiculopathy of cervical region 07/29/2015   Cervical facet syndrome 07/29/2015   DDD (degenerative disc disease),  lumbar 07/29/2015   Facet syndrome, lumbar 07/29/2015   Bilateral occipital neuralgia 07/29/2015   Sacroiliac joint dysfunction 07/29/2015   DJD of shoulder 07/29/2015   Herpes simplex vulvovaginitis 05/29/2015   Acute hemorrhoid 03/11/2015   CLL (chronic lymphocytic leukemia) ([place]) 01/15/2015   Hypothyroidism 12/26/2014   Arthritis 12/26/2014   Carpal tunnel syndrome, bilateral 12/26/2014   Controlled type 2 diabetes mellitus without complication, without long-term current use of insulin ([place]) 12/26/2014   Essential hypertension 12/26/2014    Hyperlipidemia, mixed 12/26/2014    Past Surgical History:  Procedure Laterality Date   ABDOMINAL HYSTERECTOMY     BILATERAL SALPINGOOPHORECTOMY  2009   BREAST BIOPSY Right 05/17/2016   FIBROADENOMATOUS CHANGE AND SCLEROSING ADENOSIS WITH   COLONOSCOPY WITH PROPOFOL N/A 06/16/2015   Procedure: COLONOSCOPY WITH PROPOFOL;  Surgeon: [name], MD;  Location: [facility] ENDOSCOPY;  Service: Endoscopy;  Laterality: N/A;   LAPAROSCOPIC SUPRACERVICAL HYSTERECTOMY  2009   due to [place] Bilateral 06/17/2020   Procedure: THORACIC LAMINECTOMY FOR EPIDURAL ABSCESS;  Surgeon: Deetta Perla, MD;  Location: [facility] ORS;  Service: Neurosurgery;  Laterality: Bilateral;    Prior to Admission medications   Medication Sig Start Date End Date Taking? Authorizing Provider  levalbuterol (XOPENEX) 1.25 MG/3ML nebulizer solution Take 1.25 mg by nebulization every 6 (six) hours as needed. 03/07/20  Yes [provider]  levothyroxine (SYNTHROID) 150 MCG tablet Take 1 tablet (150 mcg total) by mouth daily before breakfast. 07/30/20  Yes Angiulli, Lavon Paganini, PA-C  montelukast (SINGULAIR) 10 MG tablet Take 1 tablet (10 mg total) by mouth at bedtime. 07/30/20  Yes Angiulli, Lavon Paganini, PA-C  pregabalin (LYRICA) 75 MG capsule Take 75 mg by mouth in the morning and at bedtime. 09/05/20  Yes [provider]  acetaminophen (TYLENOL) 325 MG tablet Take 2 tablets (650 mg total) by mouth every 6 (six) hours as needed for mild pain (or Fever >/= 101). 07/30/20   Angiulli, Lavon Paganini, PA-C  albuterol (VENTOLIN HFA) 108 (90 Base) MCG/ACT inhaler Inhale into the lungs. 05/06/20 05/06/21  [provider]  benzonatate (TESSALON) 100 MG capsule Take by mouth. Patient not taking: No sig reported 05/06/20   [provider]  colchicine 0.6 MG tablet Take 1 tablet (0.6 mg total) by mouth daily. Patient not taking: No sig reported 07/30/20   Angiulli, Lavon Paganini,  PA-C  Continuous Blood Gluc Sensor (DEXCOM G6 SENSOR) MISC SMARTSIG:1 Each Topical Every 10 Days 08/12/20   [provider]  diclofenac Sodium (VOLTAREN) 1 % GEL Apply 2 g topically 4 (four) times daily. 07/30/20   Angiulli, Lavon Paganini, PA-C  diltiazem (DILACOR XR) 120 MG 24 hr capsule Take 1 capsule (120 mg total) by mouth daily. 07/30/20   Angiulli, Lavon Paganini, PA-C  ezetimibe (ZETIA) 10 MG tablet Take 1 tablet (10 mg total) by mouth daily. 07/30/20   Angiulli, Lavon Paganini, PA-C  fexofenadine (ALLEGRA) 180 MG tablet Take by mouth. Patient not taking: No sig reported 01/28/20   [provider]  gabapentin (NEURONTIN) 100 MG capsule 1 tablet every a.m. and 2 tablets nightly Patient not taking: No sig reported 07/30/20   Angiulli, Lavon Paganini, PA-C  hydrochlorothiazide (HYDRODIURIL) 25 MG tablet Take 1 tablet by mouth daily. 08/11/20 08/11/21  [provider]  HYDROcodone-acetaminophen (NORCO/VICODIN) 5-325 MG tablet SMARTSIG:1 tablet oral Twice Daily PRN Patient not taking: No sig reported 05/26/20   [provider]  insulin glargine (LANTUS)  100 UNIT/ML Solostar Pen Inject 35 Units into the skin daily. Patient not taking: No sig reported 07/30/20   Angiulli, Lavon Paganini, PA-C  Insulin Glargine-Lixisenatide 100-33 UNT-MCG/ML SOPN Inject into the skin. 02/19/20   [provider]  irbesartan (AVAPRO) 75 MG tablet Take 1 tablet (75 mg total) by mouth daily. Patient not taking: No sig reported 07/30/20   Angiulli, Lavon Paganini, PA-C  magnesium oxide (MAG-OX) 400 MG tablet Take by mouth. 07/30/20   [provider]  melatonin 5 MG TABS Take 1 tablet (5 mg total) by mouth at bedtime. Patient not taking: No sig reported 07/30/20   Angiulli, Lavon Paganini, PA-C  metFORMIN (GLUCOPHAGE-XR) 500 MG 24 hr tablet Take 1 tablet (500 mg total) by mouth 2 (two) times daily. 07/30/20   Angiulli, Lavon Paganini, PA-C  methocarbamol (ROBAXIN) 500 MG tablet Take 1 tablet (500 mg total) by mouth 3 (three) times  daily. 07/30/20   Angiulli, Lavon Paganini, PA-C  pantoprazole (PROTONIX) 40 MG tablet Take 1 tablet (40 mg total) by mouth daily. 07/30/20   Angiulli, Lavon Paganini, PA-C  QUEtiapine (SEROQUEL) 25 MG tablet Take 1 tablet (25 mg total) by mouth 2 (two) times daily. Patient not taking: No sig reported 07/30/20   Angiulli, Lavon Paganini, PA-C  rosuvastatin (CRESTOR) 10 MG tablet Take 1 tablet (10 mg total) by mouth at bedtime. 07/30/20   Angiulli, Lavon Paganini, PA-C  tamsulosin (FLOMAX) 0.4 MG CAPS capsule Take 2 capsules (0.8 mg total) by mouth daily after breakfast. Patient not taking: No sig reported 07/30/20   Angiulli, Lavon Paganini, PA-C  telmisartan (MICARDIS) 80 MG tablet Take by mouth.    [provider]  traMADol (ULTRAM) 50 MG tablet Take 1 tablet (50 mg total) by mouth every 6 (six) hours as needed for moderate pain. 07/30/20   Angiulli, Lavon Paganini, PA-C  traZODone (DESYREL) 50 MG tablet Take 1 tablet (50 mg total) by mouth at bedtime. Patient not taking: No sig reported 07/30/20   Angiulli, Lavon Paganini, PA-C  valACYclovir (VALTREX) 1000 MG tablet Take 1 tablet (1,000 mg total) by mouth 2 (two) times daily. 07/30/20   Angiulli, Lavon Paganini, PA-C  budesonide-formoterol (SYMBICORT) 160-4.5 MCG/ACT inhaler Inhale into the lungs. 07/14/18 03/15/19  [provider]    Allergies Patient has no known allergies.  Family History  Problem Relation Age of Onset   Cancer Paternal Aunt    Breast cancer Maternal Aunt 70    Social History Social History   Tobacco Use   Smoking status: Never   Smokeless tobacco: Never  Vaping Use   Vaping Use: Never used  Substance Use Topics   Alcohol use: Yes    Alcohol/week: 1.0 standard drink    Types: 1 Cans of beer per week   Drug use: No    Review of Systems  Constitutional: No fever/chills Eyes: No visual changes. ENT: No sore throat. Cardiovascular: Denies chest pain. Respiratory: Denies shortness of breath. Gastrointestinal: No abdominal pain.  No nausea, no  vomiting.  No diarrhea.  No constipation. Genitourinary: Negative for dysuria. Musculoskeletal: Positive for back pain. Skin: Negative for rash. Neurological: Positive for speech difficulty.  Negative for headaches, focal weakness or numbness.   ____________________________________________   PHYSICAL EXAM:  VITAL SIGNS: ED Triage Vitals  Enc Vitals Group     BP 10/01/20 1849 130/78     Pulse Rate 10/01/20 1849 (!) 105     Resp 10/01/20 1849 18     Temp 10/01/20 1849 100.2 F (  37.9 C)     Temp Source 10/01/20 1849 Oral     SpO2 10/01/20 1849 97 %     Weight 10/01/20 1849 232 lb (105.2 kg)     Height 10/01/20 1849 6' (1.829 m)     Head Circumference --      Peak Flow --      Pain Score 10/01/20 1854 8     Pain Loc --      Pain Edu? --      Excl. in [place]? --     Constitutional: Alert and oriented.  Chronically ill appearing and in no acute distress. Eyes: Conjunctivae are normal. PERRL. EOMI. Head: Atraumatic. Nose: No congestion/rhinnorhea. Mouth/Throat: Mucous membranes are mildly dry. Neck: No stridor.   Cardiovascular: Normal rate, regular rhythm. Grossly normal heart sounds.  Good peripheral circulation. Respiratory: Normal respiratory effort.  No retractions. Lungs CTAB. Gastrointestinal: Soft and nontender to light or deep palpation. No distention. No abdominal bruits. No CVA tenderness. Musculoskeletal: No spinal tenderness to palpation.  No warmth, erythema or fluctuance.  No lower extremity tenderness nor edema.  No joint effusions. Neurologic: Alert and oriented x3.  CN II to XII grossly intact.  Normal speech and language.  No stuttering.  No difficulty forming words.  Limited mobility and weakness BLE, right greater than left which is baseline per patient.   Skin:  Skin is warm, dry and intact. No rash noted.  No petechiae. Psychiatric: Mood and affect are normal. Speech and behavior are normal.  ____________________________________________   LABS (all labs  ordered are listed, but only abnormal results are displayed)  Labs Reviewed  BASIC METABOLIC PANEL - Abnormal; Notable for the following components:      Result Value   BUN 23 (*)    Creatinine, Ser 1.10 (*)    GFR, Estimated 59 (*)    All other components within normal limits  CBC - Abnormal; Notable for the following components:   Hemoglobin 11.3 (*)    MCH 25.2 (*)    RDW 16.7 (*)    All other components within normal limits  URINALYSIS, COMPLETE (UACMP) WITH MICROSCOPIC - Abnormal; Notable for the following components:   Color, Urine YELLOW (*)    APPearance HAZY (*)    Leukocytes,Ua TRACE (*)    All other components within normal limits  LIPASE, BLOOD - Abnormal; Notable for the following components:   Lipase 77 (*)    All other components within normal limits  CBC WITH DIFFERENTIAL/PLATELET - Abnormal; Notable for the following components:   Hemoglobin 11.0 (*)    HCT 35.9 (*)    MCH 25.3 (*)    RDW 16.7 (*)    All other components within normal limits  RESP PANEL BY RT-PCR (FLU A&B, COVID) ARPGX2  CULTURE, BLOOD (ROUTINE X 2)  URINE CULTURE  CULTURE, BLOOD (ROUTINE X 2) W REFLEX TO ID PANEL  LACTIC ACID, PLASMA  HEPATIC FUNCTION PANEL  HEMOGLOBIN A1C  LIPID PANEL  CREATININE, SERUM  POC URINE PREG, ED  TROPONIN I (HIGH SENSITIVITY)  TROPONIN I (HIGH SENSITIVITY)   ____________________________________________  EKG  ED ECG REPORT I, Tynia Wiers J, the attending physician, personally viewed and interpreted this ECG.   Date: 10/02/2020  EKG Time: 1900  Rate: 109  Rhythm: sinus tachycardia  Axis: Normal  Intervals:none  ST&T Change: Nonspecific  ____________________________________________  RADIOLOGY I, Jasson Siegmann J, personally viewed and evaluated these images (plain radiographs) as part of my medical decision making, as well as reviewing the written  report by the radiologist.  ED MD interpretation: No acute cardiopulmonary process; no ICH  Official  radiology report(s): CT Head Wo Contrast  Result Date: 10/02/2020 CLINICAL DATA:  Altered mental status, dysarthria EXAM: CT HEAD WITHOUT CONTRAST TECHNIQUE: Contiguous axial images were obtained from the base of the skull through the vertex without intravenous contrast. COMPARISON:  None. FINDINGS: Brain: Normal anatomic configuration. No abnormal intra or extra-axial mass lesion or fluid collection. No abnormal mass effect or midline shift. No evidence of acute intracranial hemorrhage or infarct. Ventricular size is normal. Cerebellum unremarkable. Vascular: Unremarkable Skull: Intact Sinuses/Orbits: Paranasal sinuses are clear. Orbits are unremarkable. Other: Mastoid air cells and middle ear cavities are clear. IMPRESSION: No acute intracranial abnormality.  Normal examination. Electronically Signed   By: Fidela Salisbury MD   On: 10/02/2020 00:43   DG Chest Port 1 View  Result Date: 10/01/2020 CLINICAL DATA:  Fever and abnormal speech. EXAM: PORTABLE CHEST 1 VIEW COMPARISON:  June 16, 2020. FINDINGS: Low lung volumes are seen with mild elevation of the right hemidiaphragm. There is no evidence of acute infiltrate, pleural effusion or pneumothorax. The heart size and mediastinal contours are within normal limits. Degenerative changes seen within the thoracic spine. IMPRESSION: No active cardiopulmonary disease. Electronically Signed   By: Virgina Norfolk M.D.   On: 10/01/2020 23:52     ____________________________________________   PROCEDURES  Procedure(s) performed (including Critical Care):  .1-3 Lead EKG Interpretation Performed by: Paulette Blanch, MD Authorized by: Paulette Blanch, MD     Interpretation: abnormal     ECG rate:  105   ECG rate assessment: tachycardic     Rhythm: sinus tachycardia     Ectopy: none     Conduction: normal   Comments:     Placed on cardiac monitor to evaluate for arrhythmias   ____________________________________________   INITIAL IMPRESSION /  ASSESSMENT AND PLAN / ED COURSE  As part of my medical decision making, I reviewed the following data within the electronic MEDICAL RECORD NUMBER History obtained from family, Nursing notes reviewed and incorporated, Labs reviewed, EKG interpreted, Old chart reviewed, Radiograph reviewed, Discussed with admitting physician and Notes from prior ED visits     56 year old female presenting with hypotension, stuttering speech and difficulty forming words.  Differential diagnosis includes but is not limited to sepsis, CVA/TIA, meningitis, UTI, metabolic etiologies, etc.  Rechecked oral temperature which is 98.9 F; Ibuprofen last taken at 6PM.  WBC unremarkable, mild AKI noted.  Will obtain sepsis work-up, COVID swab, chest x-ray, UA.  Obtain CT head.  Will reassess.  Clinical Course as of 10/02/20 0524  Thu Oct 02, 2020  0059 No ICH; will discuss with hospitalist services for admission. [JS]  0230 Discussed with hospitalist and admitted during epic computer downtime.  Lactic acid, differential add-on negative.  No evidence of infectious source. [JS]    Clinical Course User Index [JS] Paulette Blanch, MD     ____________________________________________   FINAL CLINICAL IMPRESSION(S) / ED DIAGNOSES  Final diagnoses:  Weakness  Speech disturbance, unspecified type     ED Discharge Orders     None        Note:  This document was prepared using Dragon voice recognition software and may include unintentional dictation errors.   Paulette Blanch, MD 10/02/20 [phone]

## 2020-10-02 NOTE — Consult Note (Signed)
Neurology Consultation Reason for Consult:  Stuttering Speech Referring Physician: Max Sane  CC: Stuttering Speech  History is obtained from:   HPI: Tonya Myers is a 55 y.o. female with a history of hypertension, diabetes and previous epidural hematoma causing paraparesis and right arm weakness who presents with waxing and waning stuttering speech.  She states that it seems to happen mostly when she is anxious and the episodes are fairly transient.  She also notices some tingling of her right arm which comes and goes ever since her epidural hematoma, but may be slightly worse when she is also stuttering.  Given that this has been going on for 3 to 4 days, there was concern for stroke but unfortunately she states that she is quite claustrophobic and despite being recommended to undergo MRI she is refusing it.   LKW: 4 days ago tpa given?: no, outside of window   ROS: A 14 point ROS was performed and is negative except as noted in the HPI.  Past Medical History:  Diagnosis Date   Acute hemorrhoid 03/11/2015   Anxiety    Arthritis    Asthma    Cancer (HCC)    Chronic back pain    CLL (chronic lymphocytic leukemia) (HCC)    Depression    Diabetes mellitus without complication (HCC)    Genital herpes    type 2   Hypertension    Hypothyroidism    Vertigo      Family History  Problem Relation Age of Onset   Cancer Paternal Aunt    Breast cancer Maternal Aunt 70     Social History:  reports that she has never smoked. She has never used smokeless tobacco. She reports current alcohol use of about 1.0 standard drink of alcohol per week. She reports that she does not use drugs.   Exam: Current vital signs: BP 99/64 (BP Location: Right Arm)   Pulse 75   Temp 98.2 F (36.8 C) (Oral)   Resp 18   Ht 6' (1.829 m)   Wt 105.2 kg   SpO2 100%   BMI 31.46 kg/m  Vital signs in last 24 hours: Temp:  [98 F (36.7 C)-100.2 F (37.9 C)] 98.2 F (36.8 C) (06/09 1147) Pulse  Rate:  [66-105] 75 (06/09 1147) Resp:  [18-20] 18 (06/09 1147) BP: (99-130)/(57-78) 99/64 (06/09 1147) SpO2:  [95 %-100 %] 100 % (06/09 1147) Weight:  [105.2 kg] 105.2 kg (06/08 1849)   Physical Exam  Constitutional: Appears well-developed and well-nourished.  Psych: Affect appropriate to situation Eyes: No scleral injection HENT: No OP obstruction MSK: no joint deformities.  Cardiovascular: Normal rate and regular rhythm.  Respiratory: Effort normal, non-labored breathing GI: Soft.  No distension. There is no tenderness.  Skin: WDI  Neuro: Mental Status: Patient is awake, alert, oriented to person, place, month, year, and situation. Patient is able to give a clear and coherent history. No signs of aphasia or neglect Cranial Nerves: II: Visual Fields are full. Pupils are equal, round, and reactive to light.   III,IV, VI: EOMI without ptosis or diploplia.  V: Facial sensation is diminished on the right VII: Facial movement is symmetric.  VIII: hearing is intact to voice X: Uvula elevates symmetrically XI: Shoulder shrug is symmetric. XII: tongue is midline without atrophy or fasciculations.  Motor: Tone is normal. Bulk is normal. 5/5 strength was present in the left arm, and the right arm she has relatively preserved flexion with significant extension weakness 2/5(she states this is longstanding).  She also has weakness of the right lower extremity >left lower extremity at least 4/5 Sensory: Sensation is diminished on the right arm compared to the left arm, symmetric on the legs Cerebellar: FNF intact on the left, consistent with weakness on the right.    I have reviewed labs in epic and the results pertinent to this consultation are: LDL 47 A1 C pedngin  I have reviewed the images obtained:CT head- negative.   Impression: 55 year old female with intermittent stuttering in the setting of previous spinal epidural abscess.  An MRI would be helpful, but I am not sure I would  pursue it to the point of general anesthesia.  Her LDL is already well controlled, and she had a recent echo during her previous hospitalization.  Dopplers do not reveal any hemodynamically significant stenosis.  At this point, even if this was stroke the only significant change I would make would be the addition of antiplatelet therapy and I think that this is low risk and could be pursued.  I discussed with the patient my recommendations for obtaining an MRI, and offered the possibility of doing this under sedation but she does not think she would be able to attempt this.  Recommendations: 1) aspirin 81 mg daily 2) continue home statin 3) PT, OT, ST 4) no further recommendations at this time beyond this.   Roland Rack, MD [facility] [phone]  If 7pm- 7am, please page neurology on call as listed in [place].

## 2020-10-02 NOTE — Progress Notes (Signed)
Anticoagulation monitoring(Lovenox):  55 yo female ordered Lovenox 40 mg Q24h    Filed Weights   10/01/20 1849  Weight: 105.2 kg (232 lb)   BMI 31.46    Lab Results  Component Value Date   CREATININE 1.10 (H) 10/01/2020   CREATININE 0.71 07/28/2020   CREATININE 0.71 07/21/2020   Estimated Creatinine Clearance: 77.4 mL/min (A) (by C-G formula based on SCr of 1.1 mg/dL (H)). Hemoglobin & Hematocrit     Component Value Date/Time   HGB 11.3 (L) 10/01/2020 1922   HGB 11.0 (L) 10/01/2020 1922   HCT 36.4 10/01/2020 1922   HCT 35.9 (L) 10/01/2020 1922     Per Protocol for Patient with estCrcl > 30 ml/min and BMI > 30, will transition to Lovenox 52.5 mg Q24h.

## 2020-10-02 NOTE — ED Notes (Signed)
RN unsuccessful attempt at IV.  Charge to attempt.

## 2020-10-02 NOTE — Evaluation (Signed)
Speech Language Pathology Evaluation Patient Details Name: Tonya Myers MRN: [id number] DOB: 1965/09/17 Today's Date: 10/02/2020 Time: 1520-1600 SLP Time Calculation (min) (ACUTE ONLY): 40 min  Problem List:  Patient Active Problem List   Diagnosis Date Noted   Acute focal neurological deficit 10/02/2020   Functional paraparesis 10/02/2020   Left upper quadrant abdominal pain    Pain    Acute pain of right knee    Hypokalemia    Urinary retention    Slow transit constipation    Meningitis    Labile blood glucose    Uncontrolled type 2 diabetes mellitus with hyperosmolar nonketotic hyperglycemia (HCC)    Transaminitis    Sleep disturbance    Anemia of chronic disease    Bacterial spinal epidural abscess 06/27/2020   Obesity (BMI 30.0-34.9)    Hypomagnesemia    Hypernatremia    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HCC)    Epidural abscess    Pneumococcal meningitis    Aphasia 06/10/2020   Hyperglycemic crisis in diabetes mellitus ([place]) 94/49/6759   Acute metabolic encephalopathy 16/38/4665   Severe sepsis (HCC)    Diabetic ketoacidosis without coma associated with type 2 diabetes mellitus ([place])    AKI (acute kidney injury) ([place])    Elevated LFTs    Acute encephalopathy 06/09/2020   Bacterial vaginosis 11/01/2016   Chronic pain in right shoulder 03/23/2016   Numbness and tingling 03/23/2016   Uncontrolled type 2 diabetes mellitus with hyperglycemia, without long-term current use of insulin ([place]) 10/27/2015   DDD (degenerative disc disease), cervical 07/29/2015   Cervical disc disorder with radiculopathy of cervical region 07/29/2015   Cervical facet syndrome 07/29/2015   DDD (degenerative disc disease), lumbar 07/29/2015   Facet syndrome, lumbar 07/29/2015   Bilateral occipital neuralgia 07/29/2015   Sacroiliac joint dysfunction 07/29/2015   DJD of shoulder 07/29/2015   Herpes simplex vulvovaginitis 05/29/2015   Acute hemorrhoid 03/11/2015   CLL (chronic lymphocytic leukemia) ([place])  01/15/2015   Hypothyroidism 12/26/2014   Arthritis 12/26/2014   Carpal tunnel syndrome, bilateral 12/26/2014   Controlled type 2 diabetes mellitus without complication, without long-term current use of insulin ([place]) 12/26/2014   Essential hypertension 12/26/2014   Hyperlipidemia, mixed 12/26/2014   Past Medical History:  Past Medical History:  Diagnosis Date   Acute hemorrhoid 03/11/2015   Anxiety    Arthritis    Asthma    Cancer (HCC)    Chronic back pain    CLL (chronic lymphocytic leukemia) ([place])    Depression    Diabetes mellitus without complication ([place])    Genital herpes    type 2   Hypertension    Hypothyroidism    Vertigo    Past Surgical History:  Past Surgical History:  Procedure Laterality Date   ABDOMINAL HYSTERECTOMY     BILATERAL SALPINGOOPHORECTOMY  2009   BREAST BIOPSY Right 05/17/2016   FIBROADENOMATOUS CHANGE AND SCLEROSING ADENOSIS WITH   COLONOSCOPY WITH PROPOFOL N/A 06/16/2015   Procedure: COLONOSCOPY WITH PROPOFOL;  Surgeon: [name], MD;  Location: [facility] ENDOSCOPY;  Service: Endoscopy;  Laterality: N/A;   LAPAROSCOPIC SUPRACERVICAL HYSTERECTOMY  2009   due to [place] Bilateral 06/17/2020   Procedure: THORACIC LAMINECTOMY FOR EPIDURAL ABSCESS;  Surgeon: Deetta Perla, MD;  Location: [facility] ORS;  Service: Neurosurgery;  Laterality: Bilateral;   HPI:  Pt presented to ER secondary to acute onset of stuttering speech, hypotension; admitted for management of acute focal neurological deficit, TIA/CVA work up.  CT head negative for acute change; CXR negative.   Assessment / Plan / Recommendation Clinical Impression  Pt appears to present w/ min Motor Speech deficits c/b Dysfluency w/ word-fining deficits moreso when under stress of the moment w/ Mild Dysarthria. This impacts her verbal communication at the conversation level w/ others. Pt exhibited a slightly deliberate pattern in her speech  presentation. When pt utilized strategies of slowing rate of speech, pausing when needed, and emphasizing speech sounds/words, pt's Dysfluency was minimal and intelligibility increased. Practiced using these stragtegies w/ pt and encouraged her to take a deep breath to calm herself during conversation w/ others. Pt was able to Highlight Anxiety as something that she felt w/ the increased conversations w/ others: she felt mild anxiety to "speak well" and "say the right words" in front of others. Discussed the importance of managing Environmental stressors around her; participating in single conversations vs conversing w/ multiple listeners at one time. Also encourged her to recognize Fatigue in herself during the day and how this might impact her verbal communication w/ others as verbal communication is a Cognitive-linguistic task. No overt expressive or receptive aphasia noted during this informal assessment at bedside. However, pt just completed Inpatient Rehab([name]) in 07/2020 after 1 month of admission for ST/PT/OT. ST D/C Summary revealed: "steady progress in attention, memory, complex problem solving, anticipatory awareness and word finding. Pt completed functional tasks of medication/money/time management, and higher level cognitive skills including alternating attention, anticipatory awareness and recall.".  Discussed that pt's healing was still ongoing and verbal communciation could also be impacted by any acute illness or medical changes(pt's BP was low for several days prior to this admit, per pt/husband).  OM exam was wfl for lingual/labial strength/ROM/tone. Discussed Fluency Strategies to aid verbal communcation and recommendations as above. Recommended pt f/u w/ Home Health skilled ST servcies to continue working on the [place] and her communication skills through strategies to improve communication in ADLs; education on reducing anxiety of the Dysfluency. CM/NSG/MD updated and order given for [facility] ST  services.    SLP Assessment  SLP Recommendation/Assessment: All further Speech Lanaguage Pathology  needs can be addressed in the next venue of care SLP Visit Diagnosis: Cognitive communication deficit (R41.841)    Follow Up Recommendations  Home health SLP    Frequency and Duration   N/a        SLP Evaluation Cognition  Overall Cognitive Status: History of cognitive impairments - at baseline Arousal/Alertness: Awake/alert Orientation Level: Oriented X4 Attention: Focused Awareness: Appears intact Behaviors:  (min anxious re: her speech) [employer] Scales of Cognitive Functioning: Purposeful/appropriate       Comprehension  Auditory Comprehension Overall Auditory Comprehension: Impaired at baseline (mild) Visual Recognition/Discrimination Discrimination: Not tested Reading Comprehension Reading Status: Not tested    Expression Expression Primary Mode of Expression: Verbal Verbal Expression Overall Verbal Expression: Impaired at baseline (mild) Non-Verbal Means of Communication: Not applicable Written Expression Dominant Hand: Right Written Expression: Not tested   Oral / Motor  Oral Motor/Sensory Function Overall Oral Motor/Sensory Function: Within functional limits Motor Speech Overall Motor Speech: Impaired at baseline (mild w/ possibly some new from baseline) Respiration: Within functional limits Phonation: Normal Resonance: Within functional limits Articulation: Within functional limitis (grossly) Intelligibility: Intelligible Motor Planning: Impaired Level of Impairment: Insurance risk surveyor Errors: Aware Effective Techniques: Slow rate;Increased vocal intensity;Over-articulate;Pause   GO                      Orinda Kenner,  MS, [employer] operator Rehab Services [phone] [facility] 10/02/2020, 4:52 PM

## 2020-10-02 NOTE — H&P (Signed)
History and Physical    Tonya Myers CWC:[id number] DOB: 09/28/1965 DOA: 10/01/2020  PCP: [name], MD   Patient coming from: Home  I have personally briefly reviewed patient's old medical records in [place]  Chief Complaint: Stuttering speech, low blood pressure  HPI: Tonya Myers is a 55 y.o. 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  Patient also reported that for the past 4 days she has been stuttering at times and has difficulty getting her words out.  She denies any weakness of the upper extremities or face or of the lower extremities beyond her baseline.  Denies drooling or difficulty swallowing.  Denies visual disturbance.  Denies feeling numbness or tingling.  Denies fever or chills.  Has chronic left-sided back pain which is at baseline.  She denied headache or neck pain or stiffness ED course: On arrival she was mildly tachycardic at 105 with temperature 100.2 and otherwise normal vitals.  Without intervention, vitals normalized while in the ED.  Blood work showed normal WBC of 5.7 and lactic acid 1.6.  Hemoglobin 11.3, creatinine 1.1, LFTs WNL, lipase 77, urinalysis unremarkable.  COVID and flu negative EKG, personally viewed and interpreted: Sinus tachycardia at 109 with no acute ST-T wave changes Imaging: Chest x-ray with no active cardiopulmonary disease CT head with no acute intracranial abnormality  Hospitalist consulted for admission  Review of Systems: As per HPI otherwise all other systems on review of systems negative.    Past Medical History:  Diagnosis Date   Acute hemorrhoid 03/11/2015   Anxiety    Arthritis     Asthma    Cancer ([place])    Chronic back pain    CLL (chronic lymphocytic leukemia) ([place])    Depression    Diabetes mellitus without complication ([place])    Genital herpes    type 2   Hypertension    Hypothyroidism    Vertigo     Past Surgical History:  Procedure Laterality Date   ABDOMINAL HYSTERECTOMY     BILATERAL SALPINGOOPHORECTOMY  2009   BREAST BIOPSY Right 05/17/2016   FIBROADENOMATOUS CHANGE AND SCLEROSING ADENOSIS WITH   COLONOSCOPY WITH PROPOFOL N/A 06/16/2015   Procedure: COLONOSCOPY WITH PROPOFOL;  Surgeon: [name], MD;  Location: [facility] ENDOSCOPY;  Service: Endoscopy;  Laterality: N/A;   LAPAROSCOPIC SUPRACERVICAL HYSTERECTOMY  2009   due to [place] Bilateral 06/17/2020   Procedure: THORACIC LAMINECTOMY FOR EPIDURAL ABSCESS;  Surgeon: Deetta Perla, MD;  Location: [facility] ORS;  Service: Neurosurgery;  Laterality: Bilateral;     reports that she has never smoked. She has never used smokeless tobacco. She reports current alcohol use of about 1.0 standard drink of alcohol per week. She reports that she does not use drugs.  No Known Allergies  Family History  Problem Relation Age of Onset   Cancer Paternal Aunt    Breast cancer Maternal Aunt 70      Prior to Admission medications   Medication Sig Start Date End Date Taking? Authorizing Provider  levalbuterol (XOPENEX) 1.25 MG/3ML nebulizer solution Take 1.25 mg by nebulization every 6 (six) hours as needed. 03/07/20  Yes [provider]  levothyroxine (SYNTHROID) 150 MCG tablet  Take 1 tablet (150 mcg total) by mouth daily before breakfast. 07/30/20  Yes Angiulli, Lavon Paganini, PA-C  montelukast (SINGULAIR) 10 MG tablet Take 1 tablet (10 mg total) by mouth at bedtime. 07/30/20  Yes Angiulli, Lavon Paganini, PA-C  pregabalin (LYRICA) 75 MG capsule Take 75 mg by mouth in the morning and at bedtime. 09/05/20  Yes [provider]  acetaminophen (TYLENOL)  325 MG tablet Take 2 tablets (650 mg total) by mouth every 6 (six) hours as needed for mild pain (or Fever >/= 101). 07/30/20   Angiulli, Lavon Paganini, PA-C  albuterol (VENTOLIN HFA) 108 (90 Base) MCG/ACT inhaler Inhale into the lungs. 05/06/20 05/06/21  [provider]  benzonatate (TESSALON) 100 MG capsule Take by mouth. Patient not taking: No sig reported 05/06/20   [provider]  colchicine 0.6 MG tablet Take 1 tablet (0.6 mg total) by mouth daily. Patient not taking: No sig reported 07/30/20   Angiulli, Lavon Paganini, PA-C  Continuous Blood Gluc Sensor (DEXCOM G6 SENSOR) MISC SMARTSIG:1 Each Topical Every 10 Days 08/12/20   [provider]  diclofenac Sodium (VOLTAREN) 1 % GEL Apply 2 g topically 4 (four) times daily. 07/30/20   Angiulli, Lavon Paganini, PA-C  diltiazem (DILACOR XR) 120 MG 24 hr capsule Take 1 capsule (120 mg total) by mouth daily. 07/30/20   Angiulli, Lavon Paganini, PA-C  ezetimibe (ZETIA) 10 MG tablet Take 1 tablet (10 mg total) by mouth daily. 07/30/20   Angiulli, Lavon Paganini, PA-C  fexofenadine (ALLEGRA) 180 MG tablet Take by mouth. Patient not taking: No sig reported 01/28/20   [provider]  gabapentin (NEURONTIN) 100 MG capsule 1 tablet every a.m. and 2 tablets nightly Patient not taking: No sig reported 07/30/20   Angiulli, Lavon Paganini, PA-C  hydrochlorothiazide (HYDRODIURIL) 25 MG tablet Take 1 tablet by mouth daily. 08/11/20 08/11/21  [provider]  HYDROcodone-acetaminophen (NORCO/VICODIN) 5-325 MG tablet SMARTSIG:1 tablet oral Twice Daily PRN Patient not taking: No sig reported 05/26/20   [provider]  insulin glargine (LANTUS) 100 UNIT/ML Solostar Pen Inject 35 Units into the skin daily. Patient not taking: No sig reported 07/30/20   Angiulli, Lavon Paganini, PA-C  Insulin Glargine-Lixisenatide 100-33 UNT-MCG/ML SOPN Inject into the skin. 02/19/20   [provider]  irbesartan (AVAPRO) 75 MG tablet Take 1 tablet (75 mg total) by mouth  daily. Patient not taking: No sig reported 07/30/20   Angiulli, Lavon Paganini, PA-C  magnesium oxide (MAG-OX) 400 MG tablet Take by mouth. 07/30/20   [provider]  melatonin 5 MG TABS Take 1 tablet (5 mg total) by mouth at bedtime. Patient not taking: No sig reported 07/30/20   Angiulli, Lavon Paganini, PA-C  metFORMIN (GLUCOPHAGE-XR) 500 MG 24 hr tablet Take 1 tablet (500 mg total) by mouth 2 (two) times daily. 07/30/20   Angiulli, Lavon Paganini, PA-C  methocarbamol (ROBAXIN) 500 MG tablet Take 1 tablet (500 mg total) by mouth 3 (three) times daily. 07/30/20   Angiulli, Lavon Paganini, PA-C  pantoprazole (PROTONIX) 40 MG tablet Take 1 tablet (40 mg total) by mouth daily. 07/30/20   Angiulli, Lavon Paganini, PA-C  QUEtiapine (SEROQUEL) 25 MG tablet Take 1 tablet (25 mg total) by mouth 2 (two) times daily. Patient not taking: No sig reported 07/30/20   Angiulli, Lavon Paganini, PA-C  rosuvastatin (CRESTOR) 10 MG tablet Take 1 tablet (10 mg total) by mouth at bedtime. 07/30/20   Angiulli, Lavon Paganini, PA-C  tamsulosin (FLOMAX) 0.4 MG CAPS capsule Take 2  capsules (0.8 mg total) by mouth daily after breakfast. Patient not taking: No sig reported 07/30/20   Angiulli, Lavon Paganini, PA-C  telmisartan (MICARDIS) 80 MG tablet Take by mouth.    [provider]  traMADol (ULTRAM) 50 MG tablet Take 1 tablet (50 mg total) by mouth every 6 (six) hours as needed for moderate pain. 07/30/20   Angiulli, Lavon Paganini, PA-C  traZODone (DESYREL) 50 MG tablet Take 1 tablet (50 mg total) by mouth at bedtime. Patient not taking: No sig reported 07/30/20   Angiulli, Lavon Paganini, PA-C  valACYclovir (VALTREX) 1000 MG tablet Take 1 tablet (1,000 mg total) by mouth 2 (two) times daily. 07/30/20   Angiulli, Lavon Paganini, PA-C  budesonide-formoterol (SYMBICORT) 160-4.5 MCG/ACT inhaler Inhale into the lungs. 07/14/18 03/15/19  [provider]    Physical Exam: Vitals:   10/01/20 2346 10/02/20 0041 10/02/20 0100 10/02/20 0200  BP:  109/63 110/67 (!) 115/57  Pulse:  81  80 79  Resp:  18    Temp: 98.9 F (37.2 C)     TempSrc: Oral     SpO2:  96% 100% 97%  Weight:      Height:         Vitals:   10/01/20 2346 10/02/20 0041 10/02/20 0100 10/02/20 0200  BP:  109/63 110/67 (!) 115/57  Pulse:  81 80 79  Resp:  18    Temp: 98.9 F (37.2 C)     TempSrc: Oral     SpO2:  96% 100% 97%  Weight:      Height:          Constitutional: Alert and oriented x 3 . Not in any apparent distress HEENT:      Head: Normocephalic and atraumatic.         Eyes: PERLA, EOMI, Conjunctivae are normal. Sclera is non-icteric.       Mouth/Throat: Mucous membranes are moist.       Neck: Supple with no signs of meningismus. Cardiovascular: Regular rate and rhythm. No murmurs, gallops, or rubs. 2+ symmetrical distal pulses are present . No JVD. No LE edema Respiratory: Respiratory effort normal .Lungs sounds clear bilaterally. No wheezes, crackles, or rhonchi.  Gastrointestinal: Soft, non tender, and non distended with positive bowel sounds.  Genitourinary: No CVA tenderness. Musculoskeletal: Nontender with normal range of motion in all extremities. No cyanosis, or erythema of extremities. Neurologic:  Face is symmetric. Moving all extremities. Weakness lower extremities. Speech is normal but will occasionally pause/delay in getting a word out Skin: Skin is warm, dry.  No rash or ulcers Psychiatric: Mood and affect are normal    Labs on Admission: I have personally reviewed following labs and imaging studies  CBC: Recent Labs  Lab 10/01/20 1922  WBC 5.7  5.8  NEUTROABS 3.7  HGB 11.0*  11.3*  HCT 35.9*  36.4  MCV 82.7  81.1  PLT 306  811   Basic Metabolic Panel: Recent Labs  Lab 10/01/20 1922  NA 141  K 4.2  CL 111  CO2 22  GLUCOSE 92  BUN 23*  CREATININE 1.10*  CALCIUM 9.1   GFR: Estimated Creatinine Clearance: 77.4 mL/min (A) (by C-G formula based on SCr of 1.1 mg/dL (H)). Liver Function Tests: Recent Labs  Lab 10/01/20 1922  AST 32  ALT  10  ALKPHOS 77  BILITOT 0.6  PROT 7.2  ALBUMIN 4.0   Recent Labs  Lab 10/01/20 1922  LIPASE 77*   No results for input(s): AMMONIA in  the last 168 hours. Coagulation Profile: No results for input(s): INR, PROTIME in the last 168 hours. Cardiac Enzymes: No results for input(s): CKTOTAL, CKMB, CKMBINDEX, TROPONINI in the last 168 hours. BNP (last 3 results) No results for input(s): PROBNP in the last 8760 hours. HbA1C: No results for input(s): HGBA1C in the last 72 hours. CBG: No results for input(s): GLUCAP in the last 168 hours. Lipid Profile: No results for input(s): CHOL, HDL, LDLCALC, TRIG, CHOLHDL, LDLDIRECT in the last 72 hours. Thyroid Function Tests: No results for input(s): TSH, T4TOTAL, FREET4, T3FREE, THYROIDAB in the last 72 hours. Anemia Panel: No results for input(s): VITAMINB12, FOLATE, FERRITIN, TIBC, IRON, RETICCTPCT in the last 72 hours. Urine analysis:    Component Value Date/Time   COLORURINE YELLOW (A) 10/01/2020 2351   APPEARANCEUR HAZY (A) 10/01/2020 2351   LABSPEC 1.016 10/01/2020 2351   PHURINE 5.0 10/01/2020 2351   GLUCOSEU NEGATIVE 10/01/2020 2351   HGBUR NEGATIVE 10/01/2020 2351   BILIRUBINUR NEGATIVE 10/01/2020 2351   KETONESUR NEGATIVE 10/01/2020 2351   PROTEINUR NEGATIVE 10/01/2020 2351   NITRITE NEGATIVE 10/01/2020 2351   LEUKOCYTESUR TRACE (A) 10/01/2020 2351    Radiological Exams on Admission: CT Head Wo Contrast  Result Date: 10/02/2020 CLINICAL DATA:  Altered mental status, dysarthria EXAM: CT HEAD WITHOUT CONTRAST TECHNIQUE: Contiguous axial images were obtained from the base of the skull through the vertex without intravenous contrast. COMPARISON:  None. FINDINGS: Brain: Normal anatomic configuration. No abnormal intra or extra-axial mass lesion or fluid collection. No abnormal mass effect or midline shift. No evidence of acute intracranial hemorrhage or infarct. Ventricular size is normal. Cerebellum unremarkable. Vascular:  Unremarkable Skull: Intact Sinuses/Orbits: Paranasal sinuses are clear. Orbits are unremarkable. Other: Mastoid air cells and middle ear cavities are clear. IMPRESSION: No acute intracranial abnormality.  Normal examination. Electronically Signed   By: Fidela Salisbury MD   On: 10/02/2020 00:43   DG Chest Port 1 View  Result Date: 10/01/2020 CLINICAL DATA:  Fever and abnormal speech. EXAM: PORTABLE CHEST 1 VIEW COMPARISON:  June 16, 2020. FINDINGS: Low lung volumes are seen with mild elevation of the right hemidiaphragm. There is no evidence of acute infiltrate, pleural effusion or pneumothorax. The heart size and mediastinal contours are within normal limits. Degenerative changes seen within the thoracic spine. IMPRESSION: No active cardiopulmonary disease. Electronically Signed   By: Virgina Norfolk M.D.   On: 10/01/2020 23:52     Assessment/Plan 55 year old female with history of DM, HTN, hypothyroidism, CLL, functional paraparesis and nonambulant presenting with stuttering speech x4 days and low BP during physical therapy on the day of arrival .     Acute focal neurological deficit affecting speech -Patient with 4-day history of intermittent stuttering and problems with word finding - CT head with no acute intracranial abnormality - Patient declines MRI and declines sedation to tolerate MRI - Stroke work-up to include continuous cardiac monitoring, carotid Doppler - Had unremarkable echocardiogram February 2022 so will not repeat - Neurology consult - PT OT speech evals  SIRS -Patient had a low-grade temperature of 100.2 with mild tachycardia of 105 on arrival but with normal WBC and lactic acid and no stigmata of infection - Follow blood and urine cultures - Continue to monitor for infection given history of epidural abscess - No antibiotics started at this time    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 08/6312 - Acute complication not  suspected at this time - We will continue to monitor  CLL (chronic lymphocytic leukemia) (HCC) - No acute concerns at this time    Hypothyroidism - Continue levothyroxine    Controlled type 2 diabetes mellitus - Sliding scale insulin coverage  Essential hypertension -Hold antihypertensives given report of systolics in the 51V and to allow for permissive hypertension until stroke ruled in    DVT prophylaxis: Lovenox  Code Status: full code  Family Communication:  none  Disposition Plan: Back to previous home environment Consults called: neurology  Status:observation     Athena Masse MD [facility]     10/02/2020, 4:17 AM

## 2020-10-02 NOTE — ED Provider Notes (Signed)
[facility] Emergency Department Provider Note   ____________________________________________   Event Date/Time   First MD Initiated Contact with Patient 10/01/20 2329     (approximate)  I have reviewed the triage vital signs and the nursing notes.   HISTORY  Chief Complaint Hypotension and Speech Problem    HPI Tonya Myers is a 55 y.o. female who presents to the ED from home with a chief complaint of hypotension, stuttering speech, difficulty getting her words out.  Patient with a past medical history of CLL, diabetes with DKA, hypertension who first called into her PCP on 06/04/2020 for concerns of struggling to speak and difficulty putting words together.  Subsequently she complained of severe back pain.  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  She was then admitted to inpatient rehab due to decreased functional ability.  Has not been able to walk since that time and is currently doing physical therapy.  While she was in physical therapy today her blood pressure was noted to be in the 92J systolic.  Patient states she began to have stuttering speech and difficulty getting her words out 4 days ago.  Also endorses right lower back pain.  Last took ibuprofen at 6 PM for back pain.  Denies fever, chills, cough, chest pain, shortness of breath, abdominal pain, nausea, vomiting or diarrhea.     Past Medical History:  Diagnosis Date   Acute hemorrhoid 03/11/2015   Anxiety    Arthritis    Asthma    Cancer (HCC)    Chronic back pain    CLL (chronic lymphocytic leukemia) ([place])    Depression    Diabetes mellitus without complication ([place])    Genital herpes    type 2   Hypertension    Hypothyroidism    Vertigo     Patient Active Problem List   Diagnosis Date Noted   Acute focal  neurological deficit 10/02/2020   Left upper quadrant abdominal pain    Pain    Acute pain of right knee    Hypokalemia    Urinary retention    Slow transit constipation    Meningitis    Labile blood glucose    Uncontrolled type 2 diabetes mellitus with hyperosmolar nonketotic hyperglycemia (HCC)    Transaminitis    Sleep disturbance    Anemia of chronic disease    Bacterial spinal epidural abscess 06/27/2020   Obesity (BMI 30.0-34.9)    Hypomagnesemia    Hypernatremia    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HCC)    Epidural abscess    Pneumococcal meningitis    Aphasia 06/10/2020   Hyperglycemic crisis in diabetes mellitus ([place]) 19/41/7408   Acute metabolic encephalopathy 14/48/1856   Severe sepsis ([place])    Diabetic ketoacidosis without coma associated with type 2 diabetes mellitus ([place])    AKI (acute kidney injury) ([place])    Elevated LFTs    Acute encephalopathy 06/09/2020   Bacterial vaginosis 11/01/2016   Chronic pain in right shoulder 03/23/2016   Numbness and tingling 03/23/2016   Uncontrolled type 2 diabetes mellitus with hyperglycemia, without long-term current use of insulin ([place]) 10/27/2015   DDD (degenerative disc disease), cervical 07/29/2015   Cervical disc disorder with radiculopathy of cervical region 07/29/2015   Cervical facet syndrome 07/29/2015   DDD (degenerative disc disease), lumbar 07/29/2015   Facet  syndrome, lumbar 07/29/2015   Bilateral occipital neuralgia 07/29/2015   Sacroiliac joint dysfunction 07/29/2015   DJD of shoulder 07/29/2015   Herpes simplex vulvovaginitis 05/29/2015   Acute hemorrhoid 03/11/2015   CLL (chronic lymphocytic leukemia) ([place]) 01/15/2015   Hypothyroidism 12/26/2014   Arthritis 12/26/2014   Carpal tunnel syndrome, bilateral 12/26/2014   Controlled type 2 diabetes mellitus without complication, without long-term current use of insulin ([place]) 12/26/2014   Essential hypertension 12/26/2014   Hyperlipidemia, mixed 12/26/2014    Past  Surgical History:  Procedure Laterality Date   ABDOMINAL HYSTERECTOMY     BILATERAL SALPINGOOPHORECTOMY  2009   BREAST BIOPSY Right 05/17/2016   FIBROADENOMATOUS CHANGE AND SCLEROSING ADENOSIS WITH   COLONOSCOPY WITH PROPOFOL N/A 06/16/2015   Procedure: COLONOSCOPY WITH PROPOFOL;  Surgeon: [name], MD;  Location[facility] END[facility]COPY;  Service: Endoscopy;  Laterality: N/A;   LAPAROSCOPIC SUPRACERVICAL HYSTERECTOMY  2009   due to [place] Bilateral 06/17/2020   Procedure: THORACIC LAMINECTOMY FOR EPIDURAL ABSCESS;  Surgeon: Deetta Perla, MD;  Location: [facility] ORS;  Service: Neurosurgery;  Laterality: Bilateral;    Prior to Admission medications   Medication Sig Start Date End Date Taking? Authorizing Provider  acetaminophen (TYLENOL) 325 MG tablet Take 2 tablets (650 mg total) by mouth every 6 (six) hours as needed for mild pain (or Fever >/= 101). 07/30/20   Angiulli, Lavon Paganini, PA-C  albuterol (VENTOLIN HFA) 108 (90 Base) MCG/ACT inhaler Inhale into the lungs. 05/06/20 05/06/21  [provider]  benzonatate (TESSALON) 100 MG capsule Take by mouth. 05/06/20   [provider]  colchicine 0.6 MG tablet Take 1 tablet (0.6 mg total) by mouth daily. Patient not taking: Reported on 08/21/2020 07/30/20   Angiulli, Lavon Paganini, PA-C  Continuous Blood Gluc Sensor (DEXCOM G6 SENSOR) MISC SMARTSIG:1 Each Topical Every 10 Days 08/12/20   [provider]  diclofenac Sodium (VOLTAREN) 1 % GEL Apply 2 g topically 4 (four) times daily. 07/30/20   Angiulli, Lavon Paganini, PA-C  diltiazem (DILACOR XR) 120 MG 24 hr capsule Take 1 capsule (120 mg total) by mouth daily. 07/30/20   Angiulli, Lavon Paganini, PA-C  ezetimibe (ZETIA) 10 MG tablet Take 1 tablet (10 mg total) by mouth daily. 07/30/20   Angiulli, Lavon Paganini, PA-C  fexofenadine (ALLEGRA) 180 MG tablet Take by mouth. 01/28/20   [provider]  gabapentin (NEURONTIN) 100 MG capsule 1 tablet  every a.m. and 2 tablets nightly 07/30/20   Angiulli, Lavon Paganini, PA-C  hydrochlorothiazide (HYDRODIURIL) 25 MG tablet Take 1 tablet by mouth daily. 08/11/20 08/11/21  [provider]  HYDROcodone-acetaminophen (NORCO/VICODIN) 5-325 MG tablet SMARTSIG:1 tablet oral Twice Daily PRN Patient not taking: Reported on 08/21/2020 05/26/20   [provider]  insulin glargine (LANTUS) 100 UNIT/ML Solostar Pen Inject 35 Units into the skin daily. 07/30/20   Angiulli, Lavon Paganini, PA-C  Insulin Glargine-Lixisenatide 100-33 UNT-MCG/ML SOPN Inject into the skin. 02/19/20   [provider]  irbesartan (AVAPRO) 75 MG tablet Take 1 tablet (75 mg total) by mouth daily. 07/30/20   Angiulli, Lavon Paganini, PA-C  levalbuterol (XOPENEX) 1.25 MG/3ML nebulizer solution Take 1.25 mg by nebulization every 6 (six) hours as needed. 03/07/20   [provider]  levothyroxine (SYNTHROID) 150 MCG tablet Take 1 tablet (150 mcg total) by mouth daily before breakfast. 07/30/20   Angiulli, Lavon Paganini, PA-C  magnesium oxide (MAG-OX) 400 MG tablet Take by mouth. 07/30/20  [provider]  melatonin 5 MG TABS Take 1 tablet (5 mg total) by mouth at bedtime. 07/30/20   Angiulli, Lavon Paganini, PA-C  metFORMIN (GLUCOPHAGE-XR) 500 MG 24 hr tablet Take 1 tablet (500 mg total) by mouth 2 (two) times daily. 07/30/20   Angiulli, Lavon Paganini, PA-C  methocarbamol (ROBAXIN) 500 MG tablet Take 1 tablet (500 mg total) by mouth 3 (three) times daily. 07/30/20   Angiulli, Lavon Paganini, PA-C  montelukast (SINGULAIR) 10 MG tablet Take 1 tablet (10 mg total) by mouth at bedtime. 07/30/20   Angiulli, Lavon Paganini, PA-C  pantoprazole (PROTONIX) 40 MG tablet Take 1 tablet (40 mg total) by mouth daily. 07/30/20   Angiulli, Lavon Paganini, PA-C  QUEtiapine (SEROQUEL) 25 MG tablet Take 1 tablet (25 mg total) by mouth 2 (two) times daily. 07/30/20   Angiulli, Lavon Paganini, PA-C  rosuvastatin (CRESTOR) 10 MG tablet Take 1 tablet (10 mg total) by mouth at bedtime. 07/30/20    Angiulli, Lavon Paganini, PA-C  tamsulosin (FLOMAX) 0.4 MG CAPS capsule Take 2 capsules (0.8 mg total) by mouth daily after breakfast. 07/30/20   Angiulli, Lavon Paganini, PA-C  telmisartan (MICARDIS) 80 MG tablet Take by mouth.    [provider]  traMADol (ULTRAM) 50 MG tablet Take 1 tablet (50 mg total) by mouth every 6 (six) hours as needed for moderate pain. 07/30/20   Angiulli, Lavon Paganini, PA-C  traZODone (DESYREL) 50 MG tablet Take 1 tablet (50 mg total) by mouth at bedtime. 07/30/20   Angiulli, Lavon Paganini, PA-C  valACYclovir (VALTREX) 1000 MG tablet Take 1 tablet (1,000 mg total) by mouth 2 (two) times daily. 07/30/20   Angiulli, Lavon Paganini, PA-C  budesonide-formoterol (SYMBICORT) 160-4.5 MCG/ACT inhaler Inhale into the lungs. 07/14/18 03/15/19  [provider]    Allergies Patient has no known allergies.  Family History  Problem Relation Age of Onset   Cancer Paternal Aunt    Breast cancer Maternal Aunt 70    Social History Social History   Tobacco Use   Smoking status: Never   Smokeless tobacco: Never  Vaping Use   Vaping Use: Never used  Substance Use Topics   Alcohol use: Yes    Alcohol/week: 1.0 standard drink    Types: 1 Cans of beer per week   Drug use: No    Review of Systems  Constitutional: No fever/chills Eyes: No visual changes. ENT: No sore throat. Cardiovascular: Denies chest pain. Respiratory: Denies shortness of breath. Gastrointestinal: No abdominal pain.  No nausea, no vomiting.  No diarrhea.  No constipation. Genitourinary: Negative for dysuria. Musculoskeletal: Positive for back pain. Skin: Negative for rash. Neurological: Positive for speech difficulty.  Negative for headaches, focal weakness or numbness.   ____________________________________________   PHYSICAL EXAM:  VITAL SIGNS: ED Triage Vitals  Enc Vitals Group     BP 10/01/20 1849 130/78     Pulse Rate 10/01/20 1849 (!) 105     Resp 10/01/20 1849 18     Temp 10/01/20 1849 100.2 F  (37.9 C)     Temp Source 10/01/20 1849 Oral     SpO2 10/01/20 1849 97 %     Weight 10/01/20 1849 232 lb (105.2 kg)     Height 10/01/20 1849 6' (1.829 m)     Head Circumference --      Peak Flow --      Pain Score 10/01/20 1854 8     Pain Loc --      Pain Edu? --  Excl. in [place]? --     Constitutional: Alert and oriented.  Chronically ill appearing and in no acute distress. Eyes: Conjunctivae are normal. PERRL. EOMI. Head: Atraumatic. Nose: No congestion/rhinnorhea. Mouth/Throat: Mucous membranes are mildly dry. Neck: No stridor.   Cardiovascular: Normal rate, regular rhythm. Grossly normal heart sounds.  Good peripheral circulation. Respiratory: Normal respiratory effort.  No retractions. Lungs CTAB. Gastrointestinal: Soft and nontender to light or deep palpation. No distention. No abdominal bruits. No CVA tenderness. Musculoskeletal: No spinal tenderness to palpation.  No warmth, erythema or fluctuance.  No lower extremity tenderness nor edema.  No joint effusions. Neurologic: Alert and oriented x3.  CN II to XII grossly intact.  Normal speech and language.  No stuttering.  No difficulty forming words.  Limited mobility and weakness BLE, right greater than left which is baseline per patient.   Skin:  Skin is warm, dry and intact. No rash noted.  No petechiae. Psychiatric: Mood and affect are normal. Speech and behavior are normal.  ____________________________________________   LABS (all labs ordered are listed, but only abnormal results are displayed)  Labs Reviewed  BASIC METABOLIC PANEL - Abnormal; Notable for the following components:      Result Value   BUN 23 (*)    Creatinine, Ser 1.10 (*)    GFR, Estimated 59 (*)    All other components within normal limits  CBC - Abnormal; Notable for the following components:   Hemoglobin 11.3 (*)    MCH 25.2 (*)    RDW 16.7 (*)    All other components within normal limits  CBC WITH DIFFERENTIAL/PLATELET - Abnormal; Notable  for the following components:   Hemoglobin 11.0 (*)    HCT 35.9 (*)    MCH 25.3 (*)    RDW 16.7 (*)    All other components within normal limits  CULTURE, BLOOD (ROUTINE X 2)  CULTURE, BLOOD (ROUTINE X 2)  URINE CULTURE  RESP PANEL BY RT-PCR (FLU A&B, COVID) ARPGX2  LACTIC ACID, PLASMA  URINALYSIS, COMPLETE (UACMP) WITH MICROSCOPIC  HEPATIC FUNCTION PANEL  LIPASE, BLOOD  POC URINE PREG, ED  TROPONIN I (HIGH SENSITIVITY)  TROPONIN I (HIGH SENSITIVITY)   ____________________________________________  EKG  ED ECG REPORT I, Levora Werden J, the attending physician, personally viewed and interpreted this ECG.   Date: 10/02/2020  EKG Time: 1900  Rate: 109  Rhythm: sinus tachycardia  Axis: Normal  Intervals:none  ST&T Change: Nonspecific  ____________________________________________  RADIOLOGY I, Sanaia Jasso J, personally viewed and evaluated these images (plain radiographs) as part of my medical decision making, as well as reviewing the written report by the radiologist.  ED MD interpretation: No acute cardiopulmonary process; no ICH  Official radiology report(s): CT Head Wo Contrast  Result Date: 10/02/2020 CLINICAL DATA:  Altered mental status, dysarthria EXAM: CT HEAD WITHOUT CONTRAST TECHNIQUE: Contiguous axial images were obtained from the base of the skull through the vertex without intravenous contrast. COMPARISON:  None. FINDINGS: Brain: Normal anatomic configuration. No abnormal intra or extra-axial mass lesion or fluid collection. No abnormal mass effect or midline shift. No evidence of acute intracranial hemorrhage or infarct. Ventricular size is normal. Cerebellum unremarkable. Vascular: Unremarkable Skull: Intact Sinuses/Orbits: Paranasal sinuses are clear. Orbits are unremarkable. Other: Mastoid air cells and middle ear cavities are clear. IMPRESSION: No acute intracranial abnormality.  Normal examination. Electronically Signed   By: Fidela Salisbury MD   On: 10/02/2020 00:43    DG Chest Port 1 View  Result Date: 10/01/2020 CLINICAL DATA:  Fever  and abnormal speech. EXAM: PORTABLE CHEST 1 VIEW COMPARISON:  June 16, 2020. FINDINGS: Low lung volumes are seen with mild elevation of the right hemidiaphragm. There is no evidence of acute infiltrate, pleural effusion or pneumothorax. The heart size and mediastinal contours are within normal limits. Degenerative changes seen within the thoracic spine. IMPRESSION: No active cardiopulmonary disease. Electronically Signed   By: Virgina Norfolk M.D.   On: 10/01/2020 23:52     ____________________________________________   PROCEDURES  Procedure(s) performed (including Critical Care):  .1-3 Lead EKG Interpretation Performed by: Paulette Blanch, MD Authorized by: Paulette Blanch, MD     Interpretation: abnormal     ECG rate:  105   ECG rate assessment: tachycardic     Rhythm: sinus tachycardia     Ectopy: none     Conduction: normal   Comments:     Placed on cardiac monitor to evaluate for arrhythmias     ____________________________________________   INITIAL IMPRESSION / ASSESSMENT AND PLAN / ED COURSE  As part of my medical decision making, I reviewed the following data within the electronic MEDICAL RECORD NUMBER History obtained from family, Nursing notes reviewed and incorporated, Labs reviewed, EKG interpreted, Old chart reviewed, Radiograph reviewed, Discussed with admitting physician and Notes from prior ED visits     55 year old female presenting with hypotension, stuttering speech and difficulty forming words.  Differential diagnosis includes but is not limited to sepsis, CVA/TIA, meningitis, UTI, metabolic etiologies, etc.  Rechecked oral temperature which is 98.9 F; Ibuprofen last taken at 6PM.  WBC unremarkable, mild AKI noted.  Will obtain sepsis work-up, COVID swab, chest x-ray, UA.  Obtain CT head.  Will reassess.  Clinical Course as of 10/02/20 0232  Thu Oct 02, 2020  0059 No ICH; will discuss with  hospitalist services for admission. [JS]  0230 Discussed with hospitalist and admitted during epic computer downtime.  Lactic acid, differential add-on negative.  No evidence of infectious source. [JS]    Clinical Course User Index [JS] Paulette Blanch, MD     ____________________________________________   FINAL CLINICAL IMPRESSION(S) / ED DIAGNOSES  Final diagnoses:  Weakness  Speech disturbance, unspecified type     ED Discharge Orders     None        Note:  This document was prepared using Dragon voice recognition software and may include unintentional dictation errors.   Paulette Blanch, MD 10/02/20 [phone]

## 2020-10-02 NOTE — ED Notes (Signed)
Secure chat to receiving nurse.

## 2020-10-03 LAB — HEMOGLOBIN A1C
Hgb A1c MFr Bld: 6.5 % — ABNORMAL HIGH (ref 4.8–5.6)
Mean Plasma Glucose: 140 mg/dL

## 2020-10-03 LAB — URINE CULTURE

## 2020-10-05 NOTE — Discharge Summary (Signed)
[facility] NAME: Jo Booze    MR#:  [id number]  DATE OF BIRTH:  Apr 20, 1966  DATE OF ADMISSION:  10/01/2020   ADMITTING PHYSICIAN: Athena Masse, MD  DATE OF DISCHARGE: 10/02/2020  6:50 PM  PRIMARY CARE PHYSICIAN: [name], MD   ADMISSION DIAGNOSIS:  TIA (transient ischemic attack) [G45.9] Weakness [R53.1] Acute focal neurological deficit [R29.818] Speech disturbance, unspecified type [R47.9] DISCHARGE DIAGNOSIS:  Principal Problem:   Acute focal neurological deficit Active Problems:   CLL (chronic lymphocytic leukemia) ([place])   Hypothyroidism   Controlled type 2 diabetes mellitus without complication, without long-term current use of insulin (HCC)   Essential hypertension   Functional paraparesis  SECONDARY DIAGNOSIS:   Past Medical History:  Diagnosis Date  . Acute hemorrhoid 03/11/2015  . Anxiety   . Arthritis   . Asthma   . Cancer ([place])   . Chronic back pain   . CLL (chronic lymphocytic leukemia) ([place])   . Depression   . Diabetes mellitus without complication ([place])   . Genital herpes    type 2  . Hypertension   . Hypothyroidism   . Vertigo    HOSPITAL COURSE:  55 year old female with history of DM, HTN, hypothyroidism, CLL, functional paraparesis and nonambulant admitted for stuttering speech x4 days and low BP during physical therapy on the day of arrival .     Acute focal neurological deficit affecting speech -Present on admission and resolved now - CT head with no acute intracranial abnormality - Patient declined MRI  - Carotid Doppler did not reveal any hemodynamically significant stenosis - Had unremarkable echocardiogram February 2022 so no need to repeat -Neurology seen and recommended to add antiplatelet therapy/aspirin 81 mg at discharge. 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  At baseline    CLL (chronic lymphocytic leukemia) (HCC) - No acute concerns at this time     Hypothyroidism - Continue levothyroxine   Controlled type 2 diabetes mellitus Essential hypertension   DISCHARGE CONDITIONS:  Stable CONSULTS OBTAINED:   DRUG ALLERGIES:  No Known Allergies DISCHARGE MEDICATIONS:   Allergies as of 10/02/2020   No Known Allergies      Medication List     STOP taking these medications    benzonatate 100 MG capsule Commonly known as: TESSALON   colchicine 0.6 MG tablet   fexofenadine 180 MG tablet Commonly known as: ALLEGRA   gabapentin 100 MG capsule Commonly known as: NEURONTIN   hydrochlorothiazide 25 MG tablet Commonly known as: HYDRODIURIL   HYDROcodone-acetaminophen 5-325 MG tablet Commonly known as: NORCO/VICODIN   insulin glargine 100 UNIT/ML Solostar Pen Commonly known as: LANTUS   irbesartan 75 MG tablet Commonly known as: AVAPRO   melatonin 5 MG Tabs   QUEtiapine 25 MG tablet Commonly known as: SEROQUEL   tamsulosin 0.4 MG Caps capsule Commonly known as: FLOMAX   telmisartan 80 MG tablet Commonly known as: MICARDIS   traZODone 50 MG tablet Commonly known as: DESYREL       TAKE these medications    acetaminophen 325 MG tablet Commonly known  as: TYLENOL Take 2 tablets (650 mg total) by mouth every 6 (six) hours as needed for mild pain (or Fever >/= 101).   albuterol 108 (90 Base) MCG/ACT inhaler Commonly known as: VENTOLIN HFA Inhale into the lungs.   aspirin EC 81 MG tablet Take 1 tablet (81 mg total) by mouth daily. Swallow whole.   Dexcom G6 Sensor Misc SMARTSIG:1 Each Topical Every 10 Days   diclofenac Sodium 1 % Gel Commonly known as: VOLTAREN Apply  2 g topically 4 (four) times daily.   diltiazem 120 MG 24 hr capsule Commonly known as: DILACOR XR Take 1 capsule (120 mg total) by mouth daily.   ezetimibe 10 MG tablet Commonly known as: ZETIA Take 1 tablet (10 mg total) by mouth daily.   Insulin Glargine-Lixisenatide 100-33 UNT-MCG/ML Sopn Inject into the skin.   levalbuterol 1.25 MG/3ML nebulizer solution Commonly known as: XOPENEX Take 1.25 mg by nebulization every 6 (six) hours as needed.   levothyroxine 150 MCG tablet Commonly known as: SYNTHROID Take 1 tablet (150 mcg total) by mouth daily before breakfast.   magnesium oxide 400 MG tablet Commonly known as: MAG-OX Take by mouth.   metFORMIN 500 MG 24 hr tablet Commonly known as: GLUCOPHAGE-XR Take 1 tablet (500 mg total) by mouth 2 (two) times daily.   methocarbamol 500 MG tablet Commonly known as: ROBAXIN Take 1 tablet (500 mg total) by mouth 3 (three) times daily.   montelukast 10 MG tablet Commonly known as: SINGULAIR Take 1 tablet (10 mg total) by mouth at bedtime.   pantoprazole 40 MG tablet Commonly known as: PROTONIX Take 1 tablet (40 mg total) by mouth daily.   pregabalin 75 MG capsule Commonly known as: LYRICA Take 75 mg by mouth in the morning and at bedtime.   rosuvastatin 10 MG tablet Commonly known as: CRESTOR Take 1 tablet (10 mg total) by mouth at bedtime.   traMADol 50 MG tablet Commonly known as: ULTRAM Take 1 tablet (50 mg total) by mouth every 6 (six) hours as needed for moderate pain.   valACYclovir 1000 MG tablet Commonly known as: Valtrex Take 1 tablet (1,000 mg total) by mouth 2 (two) times daily.       DISCHARGE INSTRUCTIONS:   DIET:  Cardiac diet DISCHARGE CONDITION:  Stable ACTIVITY:  Activity as tolerated OXYGEN:  Home Oxygen: No.  Oxygen Delivery: room air DISCHARGE LOCATION:  home with home health PT, OT, ST  If you experience worsening of your admission symptoms, develop shortness of breath, life  threatening emergency, suicidal or homicidal thoughts you must seek medical attention immediately by calling 911 or calling your MD immediately  if symptoms less severe.  You Must read complete instructions/literature along with all the possible adverse reactions/side effects for all the Medicines you take and that have been prescribed to you. Take any new Medicines after you have completely understood and accpet all the possible adverse reactions/side effects.   Please note  You were cared for by a hospitalist during your hospital stay. If you have any questions about your discharge medications or the care you received while you were in the hospital after you are discharged, you can call the unit and asked to speak with the hospitalist on call if the hospitalist that took care of you is not available. Once you are discharged, your primary care physician will handle any further medical issues. Please note that NO REFILLS for any discharge medications will be authorized once you are discharged, as it is imperative  that you return to your primary care physician (or establish a relationship with a primary care physician if you do not have one) for your aftercare needs so that they can reassess your need for medications and monitor your lab values.    On the day of Discharge:  VITAL SIGNS:  Blood pressure 99/64, pulse 75, temperature 98.2 F (36.8 C), temperature source Oral, resp. rate 18, height 6' (1.829 m), weight 105.2 kg, SpO2 100 %. PHYSICAL EXAMINATION:  GENERAL:  55 y.o.-year-old patient lying in the bed with no acute distress.  EYES: Pupils equal, round, reactive to light and accommodation. No scleral icterus. Extraocular muscles intact.  HEENT: Head atraumatic, normocephalic. Oropharynx and nasopharynx clear.  NECK:  Supple, no jugular venous distention. No thyroid enlargement, no tenderness.  LUNGS: Normal breath sounds bilaterally, no wheezing, rales,rhonchi or crepitation. No use of  accessory muscles of respiration.  CARDIOVASCULAR: S1, S2 normal. No murmurs, rubs, or gallops.  ABDOMEN: Soft, non-tender, non-distended. Bowel sounds present. No organomegaly or mass.  EXTREMITIES: No pedal edema, cyanosis, or clubbing.  NEUROLOGIC: Cranial nerves II through XII are intact. Muscle strength 5/5 in all extremities. Sensation intact. Gait not checked.  PSYCHIATRIC: The patient is alert and oriented x 3.  SKIN: No obvious rash, lesion, or ulcer.  DATA REVIEW:   CBC Recent Labs  Lab 10/01/20 1922  WBC 5.7  5.8  HGB 11.0*  11.3*  HCT 35.9*  36.4  PLT 306  302    Chemistries  Recent Labs  Lab 10/01/20 1922 10/02/20 0817  NA 141  --   K 4.2  --   CL 111  --   CO2 22  --   GLUCOSE 92  --   BUN 23*  --   CREATININE 1.10* 0.90  CALCIUM 9.1  --   AST 32  --   ALT 10  --   ALKPHOS 77  --   BILITOT 0.6  --      Outpatient follow-up  Follow-up Information     [name], MD. Schedule an appointment as soon as possible for a visit in 1 week(s).   Specialty: Family Medicine Why: [facility] Discharge F/UP.Marland Kitchen The doctor's office will call you with a follow appointment. Contact information: [place] [address] [phone]         Anabel Bene, MD. Schedule an appointment as soon as possible for a visit in 2 week(s).   Specialty: Neurology Why: [facility] Discharge F/UP.Marland Kitchen @ Contact information: [place] [facility] [place] [address] [phone]                    Management plans discussed with the patient, family and they are in agreement.  CODE STATUS: Prior   TOTAL TIME TAKING CARE OF THIS PATIENT: 45 minutes.    Max Sane M.D on 10/05/2020 at 1:32 PM  [facility]   CC: Primary care physician; [name], MD   Note: This dictation was prepared with Dragon dictation along with smaller phrase technology. Any transcriptional  errors that result from this process are unintentional.

## 2020-10-07 LAB — CULTURE, BLOOD (ROUTINE X 2)
Culture: NO GROWTH
Culture: NO GROWTH
Special Requests: ADEQUATE
Special Requests: ADEQUATE

## 2020-10-10 ENCOUNTER — Encounter: Payer: Medicare Other | Attending: Registered Nurse | Admitting: Physical Medicine and Rehabilitation

## 2020-10-10 ENCOUNTER — Encounter: Payer: Self-pay | Admitting: Physical Medicine and Rehabilitation

## 2020-10-10 ENCOUNTER — Other Ambulatory Visit: Payer: Self-pay

## 2020-10-10 VITALS — BP 99/67 | HR 90 | Temp 99.3°F | Ht 72.0 in | Wt 182.0 lb

## 2020-10-10 DIAGNOSIS — G0490 Encephalitis and encephalomyelitis, unspecified: Secondary | ICD-10-CM | POA: Diagnosis not present

## 2020-10-10 DIAGNOSIS — Z993 Dependence on wheelchair: Secondary | ICD-10-CM | POA: Diagnosis not present

## 2020-10-10 DIAGNOSIS — G8250 Quadriplegia, unspecified: Secondary | ICD-10-CM | POA: Insufficient documentation

## 2020-10-10 DIAGNOSIS — M4620 Osteomyelitis of vertebra, site unspecified: Secondary | ICD-10-CM | POA: Insufficient documentation

## 2020-10-10 MED ORDER — DULOXETINE HCL 30 MG PO CPEP: 30.0000 mg | ORAL_CAPSULE | Freq: Every day | ORAL | 3 refills | Status: DC

## 2020-10-10 MED ORDER — TRAMADOL HCL 50 MG PO TABS: 50.0000 mg | ORAL_TABLET | Freq: Four times a day (QID) | ORAL | 5 refills | Status: DC | PRN

## 2020-10-10 NOTE — Patient Instructions (Signed)
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  For nerve pain-Duloxetine /Cymbalta 30 mg nightly x 1 week  Then 60 mg nightly- for nerve pain  1% of patients can have nausea with Duloxetine- call me if needs an anti-nausea medicine. Only lasts 7 days-  Can also cause mild dry mouth/dry eyes and mild constipation. 3. Con't Lyrica- will not interfere with it-. But might wean/reduce Lyrica if does real well on Cymbalta. 4.  Will restart Tramadol- cannot do NSAIDs due to  kidney function- 50 mg 2x/day as needed- (q6 hours prn) 5. If works well, will do Armed forces operational officer and UDS at next visit.  6. When H/H about done with therapy, call me and can switch to outpt.  7. Trying to recover from massive injury to your body- it takes time AND energy.  8. F/U in 2 months.

## 2020-10-10 NOTE — Progress Notes (Signed)
Subjective:    Patient ID: Tonya Myers, female    DOB: [birth year], 55 y.o.   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  Got head CT and afraid to take MRI-  Thinks gets excited and anxiety and then has issue.  Working on this- hoping will "clear self up" - oldest son used to stutter and grandmother.   RUE still weak- and tingling/neuropathy Sx's.  L hand starts to tingle and feel funny when rubs R hand.   On Lyrica now- 100 mg BID- was changed Monday.  Feels like helps a little, not a lot- still tingling.   Has never tried Cymbalta/Duloxetine.    Using [employer]- never got final w/c.  [facility]-  Has been 5 months.  "Still working on it".   Is 6 ft tall and foot rests don't extend at all/further than current and is very uncomfortable-   Was struggling with low BP 2 weeks ago- stopped BP meds.   Is walking some- with RW- 200 ft vs steps-  Needs A to get sit to stand- Couple times this week, let go of RW and walked 15-20 ft.  Keeps gait belt on and family there when does this.   Still working with H/H PT and OT- 2x/week for both.   Uses voltaren on knees -doesn't help They hurt "all the time.  Aching pain - keeps her from lifting the leg.   Doesn't have tramadol anymore. Was helpful for knees when had it.   Pain Inventory Average Pain 7 Pain Right Now 7 My pain is dull and tingling  LOCATION OF PAIN  shoulder fingers  BOWEL Number of stools per week: 3 Oral laxative use No  Type of laxative n/a  Enema or suppository use No  History of colostomy No  Incontinent No   BLADDER Normal In and out cath, frequency n/a Able to self cath  n/a Bladder incontinence No  Frequent urination No   Leakage with coughing No  Difficulty starting stream No  Incomplete bladder emptying No    Mobility walk with assistance use a walker ability to climb steps?  no do you drive?  no use a wheelchair needs help with transfers Do you have any goals in this area?  yes  Function retired I need assistance with the following:  bathing, meal prep, household duties, and shopping  Neuro/Psych weakness numbness tremor tingling trouble walking spasms dizziness  Prior Studies Any changes since last visit?  no  Physicians involved in your care Any changes since last visit?  no   Family History  Problem Relation Age of Onset   Cancer Paternal Aunt    Breast cancer Maternal Aunt 70   Social History   Socioeconomic History   Marital status: Single    Spouse name: Not on file   Number of children: Not on file   Years of education: Not on file   Highest education level: Not on file  Occupational History   Not on file  Tobacco Use   Smoking status: Never   Smokeless tobacco: Never  Vaping Use   Vaping Use: Never used  Substance and Sexual Activity   Alcohol use: Yes    Alcohol/week: 1.0 standard drink  Types: 1 Cans of beer per week   Drug use: No   Sexual activity: Not Currently    Birth control/protection: None  Other Topics Concern   Not on file  Social History Narrative   Not on file   Social Determinants of Health   Financial Resource Strain: Not on file  Food Insecurity: Not on file  Transportation Needs: Not on file  Physical Activity: Not on file  Stress: Not on file  Social Connections: Not on file   Past Surgical History:  Procedure Laterality Date   ABDOMINAL HYSTERECTOMY     BILATERAL SALPINGOOPHORECTOMY  2009   BREAST BIOPSY Right 05/17/2016   FIBROADENOMATOUS CHANGE AND SCLEROSING ADENOSIS WITH   COLONOSCOPY WITH PROPOFOL N/A 06/16/2015   Procedure: COLONOSCOPY WITH PROPOFOL;  Surgeon: [name], MD;  [facility] END[facility]COPY;   Service: Endoscopy;  Laterality: N/A;   LAPAROSCOPIC SUPRACERVICAL HYSTERECTOMY  2009   due to [place] Bilateral 06/17/2020   Procedure: THORACIC LAMINECTOMY FOR EPIDURAL ABSCESS;  Surgeon: Deetta Perla, MD;  Location: [facility] ORS;  Service: Neurosurgery;  Laterality: Bilateral;   Past Medical History:  Diagnosis Date   Acute hemorrhoid 03/11/2015   Anxiety    Arthritis    Asthma    Cancer (HCC)    Chronic back pain    CLL (chronic lymphocytic leukemia) (HCC)    Depression    Diabetes mellitus without complication (HCC)    Genital herpes    type 2   Hypertension    Hypothyroidism    Vertigo    BP 99/67   Pulse 90   Temp 99.3 F (37.4 C)   Ht 6' (1.829 m)   Wt 182 lb (82.6 kg)   SpO2 96%   BMI 24.68 kg/m   Opioid Risk Score:   Fall Risk Score:  `1  Depression screen PHQ 2/9  Depression screen [facility] 2/9 08/21/2020 07/29/2015  Decreased Interest 0 0  Down, Depressed, Hopeless 0 0  PHQ - 2 Score 0 0  Altered sleeping 0 -  Tired, decreased energy 2 -  Change in appetite 1 -  Feeling bad or failure about yourself  0 -  Trouble concentrating 0 -  Moving slowly or fidgety/restless 0 -  Suicidal thoughts 0 -  PHQ-9 Score 3 -    Review of Systems  Constitutional:  Positive for appetite change and unexpected weight change.  HENT: Negative.    Eyes: Negative.   Respiratory: Negative.    Cardiovascular: Negative.   Gastrointestinal: Negative.   Endocrine: Negative.   Genitourinary: Negative.   Musculoskeletal:  Positive for arthralgias and gait problem.       Spasms  Skin: Negative.   Allergic/Immunologic: Negative.   Neurological:  Positive for tremors, weakness and numbness.       Tingling  Hematological: Negative.   Psychiatric/Behavioral: Negative.    All other systems reviewed and are negative.     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  For nerve pain-Duloxetine /Cymbalta 30 mg nightly x 1 week  Then 60 mg nightly- for nerve pain  1% of patients can have nausea with Duloxetine- call me if needs an anti-nausea medicine. Only lasts 7 days-  Can also cause mild dry mouth/dry eyes and mild constipation. 3. Con't Lyrica- will not interfere with it-. But might wean/reduce Lyrica if does real well on Cymbalta. 4.  Will restart Tramadol- cannot do NSAIDs due to  kidney function- 50 mg 2x/day as needed- (q6 hours prn) 5. If works well, will do Armed forces operational officer and UDS at next visit.  6. When H/H about done with therapy, call me and can switch to outpt.  7. Trying to recover from massive injury to your body- it takes time AND energy.  8. F/U in 2 months. Double appt.  9. Can do knee injections- at next visit if need be.   I spent a total of 34 minutes on appointment- discussing pain control for neuropathy as well as knee pain.

## 2020-10-17 ENCOUNTER — Other Ambulatory Visit: Payer: Self-pay | Admitting: Neurology

## 2020-10-17 DIAGNOSIS — R209 Unspecified disturbances of skin sensation: Secondary | ICD-10-CM

## 2020-10-21 ENCOUNTER — Ambulatory Visit
Payer: Medicare Other | Attending: Student in an Organized Health Care Education/Training Program | Admitting: Student in an Organized Health Care Education/Training Program

## 2020-10-21 ENCOUNTER — Encounter: Payer: Self-pay | Admitting: Student in an Organized Health Care Education/Training Program

## 2020-10-21 ENCOUNTER — Other Ambulatory Visit: Payer: Self-pay

## 2020-10-21 VITALS — BP 105/70 | HR 92 | Temp 97.0°F | Resp 15 | Ht 72.0 in | Wt 182.0 lb

## 2020-10-21 DIAGNOSIS — G894 Chronic pain syndrome: Secondary | ICD-10-CM

## 2020-10-21 DIAGNOSIS — M170 Bilateral primary osteoarthritis of knee: Secondary | ICD-10-CM

## 2020-10-21 DIAGNOSIS — M47812 Spondylosis without myelopathy or radiculopathy, cervical region: Secondary | ICD-10-CM

## 2020-10-21 DIAGNOSIS — M25511 Pain in right shoulder: Secondary | ICD-10-CM | POA: Insufficient documentation

## 2020-10-21 DIAGNOSIS — R202 Paresthesia of skin: Secondary | ICD-10-CM | POA: Insufficient documentation

## 2020-10-21 DIAGNOSIS — R29898 Other symptoms and signs involving the musculoskeletal system: Secondary | ICD-10-CM | POA: Insufficient documentation

## 2020-10-21 DIAGNOSIS — M5030 Other cervical disc degeneration, unspecified cervical region: Secondary | ICD-10-CM | POA: Insufficient documentation

## 2020-10-21 DIAGNOSIS — M5136 Other intervertebral disc degeneration, lumbar region: Secondary | ICD-10-CM | POA: Insufficient documentation

## 2020-10-21 DIAGNOSIS — M47816 Spondylosis without myelopathy or radiculopathy, lumbar region: Secondary | ICD-10-CM | POA: Diagnosis present

## 2020-10-21 DIAGNOSIS — G039 Meningitis, unspecified: Secondary | ICD-10-CM | POA: Insufficient documentation

## 2020-10-21 DIAGNOSIS — M25561 Pain in right knee: Secondary | ICD-10-CM | POA: Diagnosis present

## 2020-10-21 DIAGNOSIS — R200 Anesthesia of skin: Secondary | ICD-10-CM | POA: Insufficient documentation

## 2020-10-21 DIAGNOSIS — M25562 Pain in left knee: Secondary | ICD-10-CM

## 2020-10-21 DIAGNOSIS — G061 Intraspinal abscess and granuloma: Secondary | ICD-10-CM

## 2020-10-21 DIAGNOSIS — G8250 Quadriplegia, unspecified: Secondary | ICD-10-CM | POA: Insufficient documentation

## 2020-10-21 DIAGNOSIS — M5481 Occipital neuralgia: Secondary | ICD-10-CM | POA: Insufficient documentation

## 2020-10-21 DIAGNOSIS — G8929 Other chronic pain: Secondary | ICD-10-CM | POA: Insufficient documentation

## 2020-10-21 DIAGNOSIS — C9110 Chronic lymphocytic leukemia of B-cell type not having achieved remission: Secondary | ICD-10-CM | POA: Diagnosis present

## 2020-10-21 DIAGNOSIS — G5603 Carpal tunnel syndrome, bilateral upper limbs: Secondary | ICD-10-CM | POA: Insufficient documentation

## 2020-10-21 DIAGNOSIS — M19011 Primary osteoarthritis, right shoulder: Secondary | ICD-10-CM | POA: Diagnosis present

## 2020-10-21 DIAGNOSIS — M5010 Cervical disc disorder with radiculopathy, unspecified cervical region: Secondary | ICD-10-CM | POA: Diagnosis present

## 2020-10-21 DIAGNOSIS — B9689 Other specified bacterial agents as the cause of diseases classified elsewhere: Secondary | ICD-10-CM | POA: Insufficient documentation

## 2020-10-21 DIAGNOSIS — M19012 Primary osteoarthritis, left shoulder: Secondary | ICD-10-CM | POA: Diagnosis present

## 2020-10-21 NOTE — Patient Instructions (Signed)
Knee Injection A knee injection is a procedure to get medicine into your knee joint to relieve the pain, swelling, and stiffness of arthritis. Your health care provider uses a needle to inject medicine, which may also help to lubricate and cushion yourknee joint. You may need more than one injection. Tell a health care provider about: Any allergies you have. All medicines you are taking, including vitamins, herbs, eye drops, creams, and over-the-counter medicines. Any problems you or family members have had with anesthetic medicines. Any blood disorders you have. Any surgeries you have had. Any medical conditions you have. Whether you are pregnant or may be pregnant. What are the risks? Generally, this is a safe procedure. However, problems may occur, including: Infection. Bleeding. Symptoms that get worse. Damage to the area around your knee. Allergic reaction to any of the medicines. Skin reactions from repeated injections. What happens before the procedure? Ask your health care provider about: Changing or stopping your regular medicines. This is especially important if you are taking diabetes medicines or blood thinners. Taking medicines such as aspirin and ibuprofen. These medicines can thin your blood. Do not take these medicines unless your health care provider tells you to take them. Taking over-the-counter medicines, vitamins, herbs, and supplements. Plan to have a responsible adult take you home from the hospital or clinic. What happens during the procedure?  You will sit or lie down in a position for your knee to be treated. The skin over your kneecap will be cleaned with a germ-killing soap. You will be given a medicine that numbs the area (local anesthetic). You may feel some stinging. The medicine will be injected into your knee. The needle is carefully placed between your kneecap and your knee. The medicine is injected into the joint space. The needle will be removed at  the end of the procedure. A bandage (dressing) may be placed over the injection site. The procedure may vary among health care providers and hospitals. What can I expect after the procedure? Your blood pressure, heart rate, breathing rate, and blood oxygen level will be monitored until you leave the hospital or clinic. You may have to move your knee through its full range of motion. This helps to get all the medicine into your joint space. You will be watched to make sure that you do not have a reaction to the injected medicine. You may feel more pain, swelling, and warmth than you did before the injection. This reaction may last about 1-2 days. Follow these instructions at home: Medicines Take over-the-counter and prescription medicines only as told by your health care provider. Ask your health care provider if the medicine prescribed to you requires you to avoid driving or using machinery. Do not take medicines such as aspirin and ibuprofen unless your health care provider tells you to take them. Injection site care Follow instructions from your health care provider about: How to take care of your puncture site. When and how you should change your dressing. When you should remove your dressing. Check your injection area every day for signs of infection. Check for: More redness, swelling, or pain after 2 days. Fluid or blood. Pus or a bad smell. Warmth. Managing pain, stiffness, and swelling  If directed, put ice on the injection area. To do this: Put ice in a plastic bag. Place a towel between your skin and the bag. Leave the ice on for 20 minutes, 2-3 times per day. Remove the ice if your skin turns bright red. This  is very important. If you cannot feel pain, heat, or cold, you have a greater risk of damage to the area. Do not apply heat to your knee. Raise (elevate) the injection area above the level of your heart while you are sitting or lying down.  General instructions If you  were given a dressing, keep it dry until your health care provider says it can be removed. Ask your health care provider when you can start showering or bathing. Avoid strenuous activities for as long as directed by your health care provider. Ask your health care provider when you can return to your normal activities. Keep all follow-up visits. This is important. You may need more injections. Contact a health care provider if you have: A fever. Warmth in your injection area. Fluid, blood, or pus coming from your injection site. Symptoms at your injection site that last longer than 2 days after your procedure. Get help right away if: Your knee turns very red. Your knee becomes very swollen. Your knee is in severe pain. Summary A knee injection is a procedure to get medicine into your knee joint to relieve the pain, swelling, and stiffness of arthritis. A needle is carefully placed between your kneecap and your knee to inject medicine into the joint space. Before the procedure, ask your health care provider about changing or stopping your regular medicines, especially if you are taking diabetes medicines or blood thinners. Contact your health care provider if you have any problems or questions after your procedure. This information is not intended to replace advice given to you by your health care provider. Make sure you discuss any questions you have with your healthcare provider. Document Revised: 09/26/2019 Document Reviewed: 09/26/2019 Elsevier Patient Education  2022 [employer].

## 2020-10-21 NOTE — Progress Notes (Signed)
Patient: Tonya Myers  Service Category: E/M  Provider: Gillis Santa, MD  DOB: 01/09/1966  DOS: 10/21/2020  Referring Provider: [name], MD  MRN: [id number]  Setting: Ambulatory outpatient  PCP: [name], MD  Type: New Patient  Specialty: Interventional Pain Management    Location: Office  Delivery: Face-to-face     Primary Reason(s) for Visit: Encounter for initial evaluation of one or more chronic problems (new to examiner) potentially causing chronic pain, and posing a threat to normal musculoskeletal function. (Level of risk: High) CC: Knee Pain (bilat) and Shoulder Pain (RIGHT)  HPI  Tonya Myers is a 55 y.o. year old, female patient, who comes for the first time to our practice referred by [name], MD for our initial evaluation of her chronic pain. She has CLL (chronic lymphocytic leukemia) ([place]); Acute hemorrhoid; DDD (degenerative disc disease), cervical; Cervical disc disorder with radiculopathy of cervical region; Cervical facet syndrome; DDD (degenerative disc disease), lumbar; Facet syndrome, lumbar; Bilateral occipital neuralgia; Sacroiliac joint dysfunction; DJD of shoulder; Hypothyroidism; Arthritis; Carpal tunnel syndrome, bilateral; Chronic right shoulder pain; Controlled type 2 diabetes mellitus without complication, without long-term current use of insulin ([place]); Essential hypertension; Herpes simplex vulvovaginitis; Hyperlipidemia, mixed; Numbness and tingling; Uncontrolled type 2 diabetes mellitus with hyperglycemia, without long-term current use of insulin ([place]); Bacterial vaginosis; Acute encephalopathy; Aphasia; Hyperglycemic crisis in diabetes mellitus ([place]); Acute metabolic encephalopathy; Severe sepsis ([place]); Diabetic ketoacidosis without coma associated with type 2 diabetes mellitus ([place]); AKI (acute kidney injury) ([place]); Elevated LFTs; Pneumococcal meningitis; 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[place]); Epidural abscess; Hypernatremia; Hypomagnesemia; Obesity (BMI 30.0-34.9);  Bacterial spinal epidural abscess; Meningitis; Labile blood glucose; Uncontrolled type 2 diabetes mellitus with hyperosmolar nonketotic hyperglycemia ([place]); Transaminitis; Sleep disturbance; Anemia of chronic disease; Slow transit constipation; Urinary retention; Hypokalemia; Chronic pain of both knees; Pain; Left upper quadrant abdominal pain; Acute focal neurological deficit; Functional paraparesis; Quadriplegia ([place]); Encephalitis; Wheelchair dependence; and Right arm weakness on their problem list. Today she comes in for evaluation of her Knee Pain (bilat) and Shoulder Pain (RIGHT)  Pain Assessment: Location: Right, Left Knee Radiating: denies Onset: More than a month ago Duration: Chronic pain Quality: Sharp, Other (Comment) ("buckling") Severity: 10-Worst pain ever/10 (subjective, self-reported pain score)  Effect on ADL: "takes longer to get where I need to go, to the bathroom, etc."; "I am bedridden" Timing: Constant Modifying factors: meds "take edge off" BP: 105/70  HR: 92  Onset and Duration: Date of onset: 2-3 months ago Cause of pain: Unknown Severity: Getting worse, NAS-11 at its worse: 10/10, NAS-11 at its best: 09/10, NAS-11 now: 10/10, and NAS-11 on the average: 10/10 Timing: Not influenced by the time of the day, During activity or exercise, and After activity or exercise Aggravating Factors: Motion and Prolonged standing Alleviating Factors:  none noted Associated Problems: Tingling and Pain that wakes patient up Quality of Pain: Aching, Agonizing, Annoying, and Constant Previous Examinations or Tests: Bone scan, CT scan, Epidurogram, MRI scan, Spinal tap, X-rays, Neurological evaluation, Neurosurgical evaluation, and Orthopedic evaluation Previous Treatments: Physical Therapy  Tonya Myers is a pleasant 55 year old female who presents with chief complaint of bilateral knee pain related to knee osteoarthritis.  She is primarily interested in gel injections in her knee  which she had over 5 years ago that provided significant pain relief.  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  She  has significant weakness of lower extremities and can ambulate with a walker.  She also has speech impediment and word finding difficulty.  She is working with neurology for that.  She is unable to ambulate by herself.  She also has right upper extremity pain, weakness, tingling.  She is seeing physical medicine and rehab for medication management.  She is working with home physical therapy as well.  She is currently on Lyrica, tramadol as well.  Cannot tolerate NSAIDs due to kidney function.  She is also on Cymbalta.   Meds   Current Outpatient Medications:    acetaminophen (TYLENOL) 325 MG tablet, Take 2 tablets (650 mg total) by mouth every 6 (six) hours as needed for mild pain (or Fever >/= 101)., Disp: , Rfl:    albuterol (VENTOLIN HFA) 108 (90 Base) MCG/ACT inhaler, Inhale into the lungs., Disp: , Rfl:    aspirin EC 81 MG tablet, Take 1 tablet (81 mg total) by mouth daily. Swallow whole., Disp: 30 tablet, Rfl: 11   Continuous Blood Gluc Sensor (DEXCOM G6 SENSOR) MISC, SMARTSIG:1 Each Topical Every 10 Days, Disp: , Rfl:    diclofenac Sodium (VOLTAREN) 1 % GEL, Apply 2 g topically 4 (four) times daily., Disp: 2 g, Rfl: 0   diltiazem (DILACOR XR) 120 MG 24 hr capsule, Take 1 capsule (120 mg total) by mouth daily., Disp: 30 capsule, Rfl: 0   DULoxetine (CYMBALTA) 30 MG capsule, Take 1 capsule (30 mg total) by mouth at bedtime. X 1 week- then 2 capsule/60 mg nightly- for nerve pain-, Disp: 60 capsule, Rfl: 3   ezetimibe (ZETIA) 10 MG tablet, Take 1 tablet (10 mg total) by mouth daily., Disp: 30 tablet, Rfl: 0   Insulin Glargine-Lixisenatide 100-33 UNT-MCG/ML SOPN, Inject into the  skin., Disp: , Rfl:    levalbuterol (XOPENEX) 1.25 MG/3ML nebulizer solution, Take 1.25 mg by nebulization every 6 (six) hours as needed., Disp: , Rfl:    levothyroxine (SYNTHROID) 150 MCG tablet, Take 1 tablet (150 mcg total) by mouth daily before breakfast., Disp: 30 tablet, Rfl: 0   magnesium oxide (MAG-OX) 400 MG tablet, Take by mouth., Disp: , Rfl:    metFORMIN (GLUCOPHAGE-XR) 500 MG 24 hr tablet, Take 1 tablet (500 mg total) by mouth 2 (two) times daily., Disp: 60 tablet, Rfl: 0   methocarbamol (ROBAXIN) 500 MG tablet, Take 1 tablet (500 mg total) by mouth 3 (three) times daily., Disp: 90 tablet, Rfl: 0   montelukast (SINGULAIR) 10 MG tablet, Take 1 tablet (10 mg total) by mouth at bedtime., Disp: 30 tablet, Rfl: 0   pantoprazole (PROTONIX) 40 MG tablet, Take 1 tablet (40 mg total) by mouth daily., Disp: 30 tablet, Rfl: 0   pregabalin (LYRICA) 100 MG capsule, Take by mouth 2 (two) times daily., Disp: , Rfl:    rosuvastatin (CRESTOR) 10 MG tablet, Take 1 tablet (10 mg total) by mouth at bedtime., Disp: 30 tablet, Rfl: 0   traMADol (ULTRAM) 50 MG tablet, Take 1 tablet (50 mg total) by mouth every 6 (six) hours as needed for moderate pain., Disp: 60 tablet, Rfl: 5   valACYclovir (VALTREX) 1000 MG tablet, Take 1 tablet (1,000 mg total) by mouth 2 (two) times daily., Disp: 60 tablet, Rfl: 1  Imaging Review  Cervical Imaging: Cervical MR wo contrast: Results for orders placed during the hospital encounter of 06/09/20  MR CERVICAL SPINE WO CONTRAST  Narrative CLINICAL DATA:  Septic arthritis  EXAM: MRI CERVICAL AND THORACIC SPINE WITHOUT CONTRAST  TECHNIQUE:  Multiplanar and multiecho pulse sequences of the cervical spine, to include the craniocervical junction and cervicothoracic junction, and the thoracic spine, were obtained without intravenous contrast.  COMPARISON:  MRI June 11, 2020.  FINDINGS: MRI CERVICAL SPINE FINDINGS  Motion limited examination.  Alignment:  Normal.  Vertebrae: Vertebral body heights are maintained. No focal marrow signal or disc signal abnormality to suggest discitis/osteomyelitis.  Canal/Cord: Significantly limited evaluation given motion without definite cord signal abnormality or canal fluid collection.  Posterior Fossa, vertebral arteries, paraspinal tissues: There is edema in the posterior paraspinal soft tissues in the upper cervical spine. No evidence of discrete fluid collection.  Disc levels:  C2-C3: Posterior disc osteophyte complex with mass effect on the ventral thecal sac. No significant foraminal stenosis.  C3-C4: Left eccentric posterior disc osteophyte complex with mass effect on the thecal sac and deformity of the ventral cord with at least moderate canal stenosis. Bilateral facet and uncovertebral hypertrophy. Evaluation of the foramina is limited with possibly moderate bilateral foraminal stenosis  C4-C5: Posterior disc osteophyte complex with mass effect on the thecal sac and suspected moderate canal stenosis. Bilateral facet and uncovertebral hypertrophy. Evaluation of foramina is limited with suspected moderate bilateral foraminal stenosis.  C5-C6: Posterior disc osteophyte complex with disc contacting the ventral cord and suspected moderate canal stenosis. Evaluation of the foramina is limited with possibly severe bilateral foraminal stenosis  C6-C7: Posterior disc osteophyte complex with bilateral facet and uncovertebral hypertrophy.  C7-T1: No significant disc protrusion, foraminal stenosis, or canal stenosis.  MRI THORACIC SPINE FINDINGS  Alignment:  Normal.  Vertebrae: Vertebral body heights are maintained. No focal marrow edema to suggest osteomyelitis.  Canal/Cord: There is a large fluid collection within the dorsal epidural space spanning the entire thoracic spine (C7-T1 to T12-L1), measuring up to 1.0 x 1.5 cm at T7-T8. The collection appears increased in size to the prior  with increased effacement of CSF around the cord. The epidural fluid collection along with a posterior disc osteophyte at T6-T7 results in severe canal stenosis at this level with near complete effacement of CSF. There is mild canal stenosis at T2-T3, T3-T4, T10-T11 and T11-T12 and moderate canal stenosis at T4-T5, T5-T6, T7-T8, T8-T9, T9-T10. No evidence of abnormal cord signal.  Paraspinal and other soft tissues: Edema in the posterior paraspinal soft tissues in the upper thoracic spine and lower thoracic spine. Nonspecific T2 hyperintense hepatic lesion, not well characterized on this study. Bibasilar lung opacities, better characterized on recent chest radiograph.  Disc levels:  Canal stenosis is detailed above. Suspected moderate left foraminal stenosis at T8-T9.  IMPRESSION: Motion limited study.  1. Large dorsal epidural fluid collection spanning the entirety of the thoracic spine, increased in size relative to February 16 MRI. The collection along with degenerative change results in severe canal stenosis at T6-T7 with near complete effacement of CSF and multilevel moderate canal stenosis in the thoracic spine, progressed since the prior and detailed above. While nonspecific by imaging, findings are concerning for epidural abscess given the patient's clinical history. Postcontrast imaging could provide more complete evaluation if clinically indicated. 2. Edema within the paraspinal soft tissues in the upper cervical and thoracic spine and lower thoracic spine, concerning for infection/cellulitis given the clinical history. No discrete drainable fluid collection identified in the paraspinal soft tissues of the cervical or thoracic spine identified, although evaluation is limited by motion and absence of IV contrast. Please see separately dictated lumbar spine for infectious findings in the lumbar spine. 3. Evaluation of degenerative change  in the cervical spine  is significantly limited given motion. Multilevel suspected at least moderate canal stenosis, worst on the left at C3-C4 where posterior disc osteophyte contacts and deforms the left cord with effacement of the left root entry zone. Evaluation of the foramina is particularly limited, but there is suspected at least moderate multilevel foraminal stenosis in the cervical spine and on the left at T8-T9. Repeat MRI (possibly with sedation) could further characterize if clinically indicated.  Critical findings discussed with Dr. Cheral Marker via telephone at 1:09 PM.   Electronically Signed By: Margaretha Sheffield MD On: 06/17/2020 13:22    Narrative CLINICAL DATA:  Septic arthritis  EXAM: MRI CERVICAL AND THORACIC SPINE WITHOUT CONTRAST  TECHNIQUE: Multiplanar and multiecho pulse sequences of the cervical spine, to include the craniocervical junction and cervicothoracic junction, and the thoracic spine, were obtained without intravenous contrast.  COMPARISON:  MRI June 11, 2020.  FINDINGS: MRI CERVICAL SPINE FINDINGS  Motion limited examination.  Alignment: Normal.  Vertebrae: Vertebral body heights are maintained. No focal marrow signal or disc signal abnormality to suggest discitis/osteomyelitis.  Canal/Cord: Significantly limited evaluation given motion without definite cord signal abnormality or canal fluid collection.  Posterior Fossa, vertebral arteries, paraspinal tissues: There is edema in the posterior paraspinal soft tissues in the upper cervical spine. No evidence of discrete fluid collection.  Disc levels:  C2-C3: Posterior disc osteophyte complex with mass effect on the ventral thecal sac. No significant foraminal stenosis.  C3-C4: Left eccentric posterior disc osteophyte complex with mass effect on the thecal sac and deformity of the ventral cord with at least moderate canal stenosis. Bilateral facet and uncovertebral hypertrophy. Evaluation of the  foramina is limited with possibly moderate bilateral foraminal stenosis  C4-C5: Posterior disc osteophyte complex with mass effect on the thecal sac and suspected moderate canal stenosis. Bilateral facet and uncovertebral hypertrophy. Evaluation of foramina is limited with suspected moderate bilateral foraminal stenosis.  C5-C6: Posterior disc osteophyte complex with disc contacting the ventral cord and suspected moderate canal stenosis. Evaluation of the foramina is limited with possibly severe bilateral foraminal stenosis  C6-C7: Posterior disc osteophyte complex with bilateral facet and uncovertebral hypertrophy.  C7-T1: No significant disc protrusion, foraminal stenosis, or canal stenosis.  MRI THORACIC SPINE FINDINGS  Alignment:  Normal.  Vertebrae: Vertebral body heights are maintained. No focal marrow edema to suggest osteomyelitis.  Canal/Cord: There is a large fluid collection within the dorsal epidural space spanning the entire thoracic spine (C7-T1 to T12-L1), measuring up to 1.0 x 1.5 cm at T7-T8. The collection appears increased in size to the prior with increased effacement of CSF around the cord. The epidural fluid collection along with a posterior disc osteophyte at T6-T7 results in severe canal stenosis at this level with near complete effacement of CSF. There is mild canal stenosis at T2-T3, T3-T4, T10-T11 and T11-T12 and moderate canal stenosis at T4-T5, T5-T6, T7-T8, T8-T9, T9-T10. No evidence of abnormal cord signal.  Paraspinal and other soft tissues: Edema in the posterior paraspinal soft tissues in the upper thoracic spine and lower thoracic spine. Nonspecific T2 hyperintense hepatic lesion, not well characterized on this study. Bibasilar lung opacities, better characterized on recent chest radiograph.  Disc levels:  Canal stenosis is detailed above. Suspected moderate left foraminal stenosis at T8-T9.  IMPRESSION: Motion limited study.  1.  Large dorsal epidural fluid collection spanning the entirety of the thoracic spine, increased in size relative to February 16 MRI. The collection along with degenerative change results in severe  canal stenosis at T6-T7 with near complete effacement of CSF and multilevel moderate canal stenosis in the thoracic spine, progressed since the prior and detailed above. While nonspecific by imaging, findings are concerning for epidural abscess given the patient's clinical history. Postcontrast imaging could provide more complete evaluation if clinically indicated. 2. Edema within the paraspinal soft tissues in the upper cervical and thoracic spine and lower thoracic spine, concerning for infection/cellulitis given the clinical history. No discrete drainable fluid collection identified in the paraspinal soft tissues of the cervical or thoracic spine identified, although evaluation is limited by motion and absence of IV contrast. Please see separately dictated lumbar spine for infectious findings in the lumbar spine. 3. Evaluation of degenerative change in the cervical spine is significantly limited given motion. Multilevel suspected at least moderate canal stenosis, worst on the left at C3-C4 where posterior disc osteophyte contacts and deforms the left cord with effacement of the left root entry zone. Evaluation of the foramina is particularly limited, but there is suspected at least moderate multilevel foraminal stenosis in the cervical spine and on the left at T8-T9. Repeat MRI (possibly with sedation) could further characterize if clinically indicated.  Critical findings discussed with Dr. Cheral Marker via telephone at 1:09 PM.   Electronically Signed By: Margaretha Sheffield MD On: 06/17/2020 13:22 DG Thoracic Spine 2 View  Narrative CLINICAL DATA:  Bilateral thoracic laminectomy for epidural abscess  EXAM: THORACIC SPINE 2 VIEWS; DG C-ARM 1-60 MIN-NO REPORT  COMPARISON:  MR  06/17/2020  FINDINGS: Intraoperative fluoroscopy was obtained during the process of a thoracic laminectomy for evacuation of an epidural abscess. Image #5 demonstrates a blunt surgical implement projects over the T4 vertebral level on the frontal radiograph. Surgical retractors seen posteriorly.  Images labeled as #3,4 are more difficult to localize given the absence of localizing landmarks. Recommend correlation with operative report.  IMPRESSION: Intraoperative fluoroscopy obtained during the process of a thoracic laminectomy for evacuation of an epidural abscess.   Electronically Signed By: Lovena Le M.D. On: 06/18/2020 01:01   Narrative CLINICAL DATA:  Pneumococcal meningitis. Follow-up lumbar spine infection. New fever today.  EXAM: MRI LUMBAR SPINE WITHOUT CONTRAST  TECHNIQUE: Multiplanar, multisequence MR imaging of the lumbar spine was performed. No intravenous contrast was administered.  COMPARISON:  Lumbar MRI 06/11/2020  FINDINGS: Segmentation:  Normal  Alignment:  Normal  Vertebrae: No evidence of discitis. No fracture or bone marrow edema.  Conus medullaris and cauda equina: Conus extends to the L1-2 level. Conus and cauda equina appear normal.  Paraspinal and other soft tissues: Posterior paraspinous muscle edema and multiple abscesses have progressed in the interval. Fluid collections are present in the muscles which appear to be associated with the facet joints on the left at L2-3 and L3-4. Abscesses appears slightly larger with increased surrounding edema. No right-sided abscess identified. Small epidural abscess posterior on the left at L2-3 is unchanged.  Disc levels:  L1-2: Negative  L2-3: Shallow central disc protrusion with mild spinal stenosis. Fluid in the left facet joint which is contiguous with a posterior paraspinous muscle abscess. Small posterior epidural abscess on the left unchanged.  L3-4: Shallow ventral epidural  fluid collection has progressed compatible with abscess. Bilateral facet degeneration. Fluid in the left facet joint appears contiguous with the posterior paraspinous muscle abscess. No significant spinal stenosis  L4-5: Shallow central disc protrusion. Bilateral facet degeneration. Mild subarticular stenosis bilaterally  L5-S1: Moderate disc degeneration with disc space narrowing. Shallow central and left-sided disc protrusion unchanged. Possible  left S1 nerve root impingement. Bilateral mild facet degeneration.  IMPRESSION: Findings compatible with lumbar spinal infection. There appears to be septic arthritis in the left L2-3 and L3-4 facets with adjacent paraspinous abscesses. Paraspinous abscesses and muscle edema appear mildly progressive.  Ventral epidural abscess at L3-4 is small but appears mildly progressive. Small posterior epidural abscess on the left at L2-3 is unchanged.   Electronically Signed By: Franchot Gallo M.D. On: 06/16/2020 16:06   DG Knee 1-2 Views Right  Narrative CLINICAL DATA:  Right knee pain and swelling, initial encounter  EXAM: RIGHT KNEE - 2 VIEW  COMPARISON:  07/21/2020  FINDINGS: Degenerative changes are noted in the medial joint space and patellofemoral space. Significant increase in joint effusion is noted. No definitive fracture is seen.  IMPRESSION: Degenerative change with large joint effusion.   Electronically Signed By: Inez Catalina M.D. On: 07/22/2020 19:21    DG Knee Complete 4 Views Left  Narrative CLINICAL DATA:  [place] down steps today. Patellar region and medial knee pain.  EXAM: LEFT KNEE - COMPLETE 4+ VIEW  COMPARISON:  None.  FINDINGS: Mild joint space narrowing in the medial compartment. Small marginal osteophytes in the lateral compartment. Patellofemoral joint space narrowing and osteophytes. No evidence of fracture or joint effusion.  IMPRESSION: No acute or traumatic finding.  Osteoarthritis as  above.   Electronically Signed By: Nelson Chimes M.D. On: 11/02/2016 12:53  Complexity Note: Imaging results reviewed. Results shared with Tonya Myers, using Layman's terms.                         ROS  Cardiovascular: High blood pressure Pulmonary or Respiratory: Wheezing and difficulty taking a deep full breath ([place]hma) Neurological: No reported neurological signs or symptoms such as seizures, abnormal skin sensations, urinary and/or fecal incontinence, being born with an abnormal open spine and/or a tethered spinal cord Psychological-Psychiatric: No reported psychological or psychiatric signs or symptoms such as difficulty sleeping, anxiety, depression, delusions or hallucinations (schizophrenial), mood swings (bipolar disorders) or suicidal ideations or attempts Gastrointestinal: No reported gastrointestinal signs or symptoms such as vomiting or evacuating blood, reflux, heartburn, alternating episodes of diarrhea and constipation, inflamed or scarred liver, or pancreas or irrregular and/or infrequent bowel movements Genitourinary: No reported renal or genitourinary signs or symptoms such as difficulty voiding or producing urine, peeing blood, non-functioning kidney, kidney stones, difficulty emptying the bladder, difficulty controlling the flow of urine, or chronic kidney disease Hematological: Weakness due to low blood hemoglobin or red blood cell count (Anemia) Endocrine:  diabetic, thyroid disease Rheumatologic: No reported rheumatological signs and symptoms such as fatigue, joint pain, tenderness, swelling, redness, heat, stiffness, decreased range of motion, with or without associated rash Musculoskeletal: Negative for myasthenia gravis, muscular dystrophy, multiple sclerosis or malignant hyperthermia Work History: Retired and Disabled  Allergies  Tonya Myers has No Known Allergies.  Laboratory Chemistry Profile   Renal Lab Results  Component Value Date   BUN 23 (H) 10/01/2020    CREATININE 0.90 10/02/2020   GFRAA >60 01/17/2020   GFRNONAA >60 10/02/2020   PROTEINUR NEGATIVE 10/01/2020     Electrolytes Lab Results  Component Value Date   NA 141 10/01/2020   K 4.2 10/01/2020   CL 111 10/01/2020   CALCIUM 9.1 10/01/2020   MG 1.6 (L) 06/25/2020   PHOS 2.2 (L) 06/24/2020     Hepatic Lab Results  Component Value Date   AST 32 10/01/2020   ALT 10 10/01/2020  ALBUMIN 4.0 10/01/2020   ALKPHOS 77 10/01/2020   LIPASE 77 (H) 10/01/2020   AMMONIA 10 06/21/2020     ID Lab Results  Component Value Date   HIV Non Reactive 06/11/2020   [place] NEGATIVE 10/01/2020   MRSAPCR NEGATIVE 06/11/2020   PREGTESTUR NEGATIVE 10/02/2020     Bone No results found for: VD25OH, [id number], [id number], [id number], 25OHVITD1, 25OHVITD2, 25OHVITD3, TESTOFREE, TESTOSTERONE   Endocrine Lab Results  Component Value Date   GLUCOSE 92 10/01/2020   GLUCOSEU NEGATIVE 10/01/2020   HGBA1C 6.5 (H) 10/02/2020   TSH 2.232 06/12/2020     Neuropathy Lab Results  Component Value Date   VITAMINB12 1,396 (H) 06/20/2020   FOLATE 14.3 06/20/2020   HGBA1C 6.5 (H) 10/02/2020   HIV Non Reactive 06/11/2020     CNS Lab Results  Component Value Date   COLORCSF MILKY (A) 06/10/2020   APPEARCSF TURBID (A) 06/10/2020   RBCCOUNTCSF 20,290 (H) 06/10/2020   WBCCSF 63,442 (HH) 06/10/2020   POLYSCSF 99 06/10/2020   LYMPHSCSF 0 06/10/2020   EOSCSF 0 06/10/2020   PROTEINCSF >600 (H) 06/10/2020   GLUCCSF <20 (LL) 06/10/2020     Inflammation (CRP: Acute  ESR: Chronic) Lab Results  Component Value Date   CRP 11.7 (H) 06/25/2020   ESRSEDRATE 128 (H) 06/25/2020   LATICACIDVEN 1.6 10/01/2020     Rheumatology Lab Results  Component Value Date   LABURIC 2.5 07/22/2020     Coagulation Lab Results  Component Value Date   INR 1.3 (H) 06/10/2020   LABPROT 15.2 06/10/2020   APTT 28 06/10/2020   PLT 302 10/01/2020   PLT 306 10/01/2020     Cardiovascular Lab Results   Component Value Date   HGB 11.3 (L) 10/01/2020   HGB 11.0 (L) 10/01/2020   HCT 36.4 10/01/2020   HCT 35.9 (L) 10/01/2020     Screening Lab Results  Component Value Date   SARSCOV2NAA NEGATIVE 10/01/2020   MRSAPCR NEGATIVE 06/11/2020   HIV Non Reactive 06/11/2020   PREGTESTUR NEGATIVE 10/02/2020     Cancer No results found for: CEA, CA125, LABCA2   Allergens No results found for: ALMOND, APPLE, ASPARAGUS, AVOCADO, BANANA, BARLEY, BASIL, BAYLEAF, GREENBEAN, LIMABEAN, WHITEBEAN, BEEFIGE, REDBEET, BLUEBERRY, BROCCOLI, CABBAGE, MELON, CARROT, CASEIN, CASHEWNUT, CAULIFLOWER, CELERY     Note: Lab results reviewed.  [place]  Drug: Tonya Myers  reports no history of drug use. Alcohol:  reports current alcohol use of about 1.0 standard drink of alcohol per week. Tobacco:  reports that she has never smoked. She has never used smokeless tobacco. Medical:  has a past medical history of Acute hemorrhoid (03/11/2015), Anxiety, Arthritis, Asthma, Cancer ([place]), Chronic back pain, CLL (chronic lymphocytic leukemia) ([place]), Depression, Diabetes mellitus without complication ([place]), Genital herpes, Hypertension, Hypothyroidism, and Vertigo. Family: family history includes Breast cancer (age of onset: 36) in her maternal aunt; Cancer in her paternal aunt.  Past Surgical History:  Procedure Laterality Date   ABDOMINAL HYSTERECTOMY     BILATERAL SALPINGOOPHORECTOMY  2009   BREAST BIOPSY Right 05/17/2016   FIBROADENOMATOUS CHANGE AND SCLEROSING ADENOSIS WITH   COLONOSCOPY WITH PROPOFOL N/A 06/16/2015   Procedure: COLONOSCOPY WITH PROPOFOL;  Surgeon: [name], MD;  Location: [facility] ENDOSCOPY;  Service: Endoscopy;  Laterality: N/A;   LAPAROSCOPIC SUPRACERVICAL HYSTERECTOMY  2009   due to [place] Bilateral 06/17/2020   Procedure: THORACIC LAMINECTOMY FOR EPIDURAL ABSCESS;  Surgeon: Deetta Perla, MD;  Location: [facility] ORS;  Service:  Neurosurgery;  Laterality: Bilateral;   Active Ambulatory Problems    Diagnosis Date Noted   CLL (chronic lymphocytic leukemia) ([place]) 01/15/2015   Acute hemorrhoid 03/11/2015   DDD (degenerative disc disease), cervical 07/29/2015   Cervical disc disorder with radiculopathy of cervical region 07/29/2015   Cervical facet syndrome 07/29/2015   DDD (degenerative disc disease), lumbar 07/29/2015   Facet syndrome, lumbar 07/29/2015   Bilateral occipital neuralgia 07/29/2015   Sacroiliac joint dysfunction 07/29/2015   DJD of shoulder 07/29/2015   Hypothyroidism 12/26/2014   Arthritis 12/26/2014   Carpal tunnel syndrome, bilateral 12/26/2014   Chronic right shoulder pain 03/23/2016   Controlled type 2 diabetes mellitus without complication, without long-term current use of insulin ([place]) 12/26/2014   Essential hypertension 12/26/2014   Herpes simplex vulvovaginitis 05/29/2015   Hyperlipidemia, mixed 12/26/2014   Numbness and tingling 03/23/2016   Uncontrolled type 2 diabetes mellitus with hyperglycemia, without long-term current use of insulin ([place]) 10/27/2015   Bacterial vaginosis 11/01/2016   Acute encephalopathy 06/09/2020   Aphasia 06/10/2020   Hyperglycemic crisis in diabetes mellitus ([place]) 12/75/1700   Acute metabolic encephalopathy 17/49/4496   Severe sepsis (HCC)    Diabetic ketoacidosis without coma associated with type 2 diabetes mellitus (HCC)    AKI (acute kidney injury) ([place])    Elevated LFTs    Pneumococcal meningitis    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HCC)    Epidural abscess    Hypernatremia    Hypomagnesemia    Obesity (BMI 30.0-34.9)    Bacterial spinal epidural abscess 06/27/2020   Meningitis    Labile blood glucose    Uncontrolled type 2 diabetes mellitus with hyperosmolar nonketotic hyperglycemia (HCC)    Transaminitis    Sleep disturbance    Anemia of chronic disease    Slow transit constipation    Urinary retention    Hypokalemia    Chronic pain of both knees     Pain    Left upper quadrant abdominal pain    Acute focal neurological deficit 10/02/2020   Functional paraparesis 10/02/2020   Quadriplegia ([place]) 10/10/2020   Encephalitis 10/10/2020   Wheelchair dependence 10/10/2020   Right arm weakness 10/21/2020   Resolved Ambulatory Problems    Diagnosis Date Noted   No Resolved Ambulatory Problems   Past Medical History:  Diagnosis Date   Anxiety    Asthma    Cancer ([place])    Chronic back pain    Depression    Diabetes mellitus without complication ([place])    Genital herpes    Hypertension    Vertigo    Constitutional Exam  General appearance: Well nourished, well developed, and well hydrated. In no apparent acute distress Vitals:   10/21/20 1301  BP: 105/70  Pulse: 92  Resp: 15  Temp: (!) 97 F (36.1 C)  SpO2: 97%  Weight: 182 lb (82.6 kg)  Height: 6' (1.829 m)   BMI Assessment: Estimated body mass index is 24.68 kg/m as calculated from the following:   Height as of this encounter: 6' (1.829 m).   Weight as of this encounter: 182 lb (82.6 kg).  BMI interpretation table: BMI level Category Range association with higher incidence of chronic pain  <18 kg/m2 Underweight   18.5-24.9 kg/m2 Ideal body weight   25-29.9 kg/m2 Overweight Increased incidence by 20%  30-34.9 kg/m2 Obese (Class I) Increased incidence by 68%  35-39.9 kg/m2 Severe obesity (Class II) Increased incidence by 136%  >40 kg/m2 Extreme obesity (Class III) Increased incidence by 254%  Patient's current BMI Ideal Body weight  Body mass index is 24.68 kg/m. Ideal body weight: 73.1 kg (161 lb 2.5 oz) Adjusted ideal body weight: 76.9 kg (169 lb 7.9 oz)   BMI Readings from Last 4 Encounters:  10/21/20 24.68 kg/m  10/10/20 24.68 kg/m  10/01/20 31.46 kg/m  06/27/20 29.81 kg/m   Wt Readings from Last 4 Encounters:  10/21/20 182 lb (82.6 kg)  10/10/20 182 lb (82.6 kg)  10/01/20 232 lb (105.2 kg)  06/27/20 219 lb 12.8 oz (99.7 kg)    Psych/Mental  status: Alert, oriented x 3 (person, place, & time)       Eyes: PERLA Respiratory: No evidence of acute respiratory distress  Assessment  Primary Diagnosis & Pertinent Problem List: The primary encounter diagnosis was Chronic pain of both knees. Diagnoses of Chronic right shoulder pain, Right arm weakness, Bacterial spinal epidural abscess, Bilateral occipital neuralgia, Carpal tunnel syndrome, bilateral, Cervical disc disorder with radiculopathy of cervical region, Cervical facet syndrome, Chronic pain in right shoulder, CLL (chronic lymphocytic leukemia) (HCC), DDD (degenerative disc disease), cervical, DDD (degenerative disc disease), lumbar, Primary osteoarthritis of both shoulders, Facet syndrome, lumbar, Meningitis, Numbness and tingling, Quadriplegia ([place]), Primary osteoarthritis of both knees, and Chronic pain syndrome were also pertinent to this visit.  Visit Diagnosis (New problems to examiner): 1. Chronic pain of both knees   2. Chronic right shoulder pain   3. Right arm weakness   4. Bacterial spinal epidural abscess   5. Bilateral occipital neuralgia   6. Carpal tunnel syndrome, bilateral   7. Cervical disc disorder with radiculopathy of cervical region   8. Cervical facet syndrome   9. Chronic pain in right shoulder   10. CLL (chronic lymphocytic leukemia) ([place])   11. DDD (degenerative disc disease), cervical   12. DDD (degenerative disc disease), lumbar   13. Primary osteoarthritis of both shoulders   14. Facet syndrome, lumbar   15. Meningitis   16. Numbness and tingling   17. Quadriplegia ([place])   18. Primary osteoarthritis of both knees   19. Chronic pain syndrome    Plan of Care (Initial workup plan)  Continue with Cymbalta, Lyrica and tramadol as prescribed. Plan for bilateral knee intra-articular Monovisc Also discussed bilateral genicular nerve block and possible RFA.  Procedure Orders  KNEE INJECTION   Interventional management options: Tonya Myers was  informed that there is no guarantee that she would be a candidate for interventional therapies. The decision will be based on the results of diagnostic studies, as well as Tonya Myers's risk profile.  Procedure(s) under consideration:  Bilateral Monovisc supplementation Bilateral genicular nerve block      Return in about 8 days (around 10/29/2020) for B/L knee monovisc.Return in about 8 days (around 10/29/2020) for B/L knee monovisc.  Future Appointments  Date Time Provider [place]  11/05/2020  1:00 PM [facility]-US1 [facility]  12/19/2020  1:00 PM Courtney Heys, MD [facility] CPR  01/19/2021 10:15 AM [facility]-MO LAB [facility] None  01/19/2021 10:45 AM Grayland Ormond, Kathlene November, MD [facility] None   Future Appointments  Date Time Provider [place]  10/29/2020 10:20 AM Gillis Santa, MD [facility] None  11/05/2020  1:00 PM M[facility]-US1 [facility]US [facility]  12/19/2020  1:00 PM Courtney Heys, MD [facility] CPR  01/19/2021 10:15 AM [facility] LAB [facility] None  01/19/2021 10:45 AM [place], Kathlene November, MD [facility] None  Gillis Santa, MDBilal Holley Raring, MD6/28/20226/28/20221:33 PM

## 2020-10-21 NOTE — Progress Notes (Signed)
Safety precautions to be maintained throughout the outpatient stay will include: orient to surroundings, keep bed in low position, maintain call bell within reach at all times, provide assistance with transfer out of bed and ambulation.  

## 2020-10-29 ENCOUNTER — Encounter: Payer: Self-pay | Admitting: Student in an Organized Health Care Education/Training Program

## 2020-10-29 ENCOUNTER — Other Ambulatory Visit: Payer: Self-pay

## 2020-10-29 ENCOUNTER — Ambulatory Visit
Payer: Medicare Other | Attending: Student in an Organized Health Care Education/Training Program | Admitting: Student in an Organized Health Care Education/Training Program

## 2020-10-29 VITALS — BP 130/83 | HR 90 | Temp 97.7°F | Resp 16 | Ht 72.0 in | Wt 180.0 lb

## 2020-10-29 DIAGNOSIS — M25562 Pain in left knee: Secondary | ICD-10-CM | POA: Diagnosis present

## 2020-10-29 DIAGNOSIS — M25561 Pain in right knee: Secondary | ICD-10-CM

## 2020-10-29 DIAGNOSIS — G8929 Other chronic pain: Secondary | ICD-10-CM | POA: Diagnosis present

## 2020-10-29 DIAGNOSIS — G894 Chronic pain syndrome: Secondary | ICD-10-CM | POA: Diagnosis present

## 2020-10-29 DIAGNOSIS — M170 Bilateral primary osteoarthritis of knee: Secondary | ICD-10-CM | POA: Diagnosis present

## 2020-10-29 MED ORDER — LIDOCAINE HCL (PF) 2 % IJ SOLN
INTRAMUSCULAR | Status: AC
  Filled 2020-10-29: qty 5

## 2020-10-29 MED ORDER — LIDOCAINE HCL 2 % IJ SOLN
20.0000 mL | Freq: Once | INTRAMUSCULAR | Status: AC
  Administered 2020-10-29: 200 mg

## 2020-10-29 MED ORDER — HYALURONAN 88 MG/4ML IX SOSY
4.0000 mL | PREFILLED_SYRINGE | Freq: Once | INTRA_ARTICULAR | Status: AC
  Administered 2020-10-29: 88 mg via INTRA_ARTICULAR
  Filled 2020-10-29: qty 4

## 2020-10-29 NOTE — Progress Notes (Signed)
Safety precautions to be maintained throughout the outpatient stay will include: orient to surroundings, keep bed in low position, maintain call bell within reach at all times, provide assistance with transfer out of bed and ambulation.  

## 2020-10-29 NOTE — Progress Notes (Signed)
PROVIDER NOTE: Information contained herein reflects review and annotations entered in association with encounter. Interpretation of such information and data should be left to medically-trained personnel. Information provided to patient can be located elsewhere in the medical record under "Patient Instructions". Document created using STT-dictation technology, any transcriptional errors that may result from process are unintentional.    Patient: Tonya Myers  Service Category: Procedure  Provider: Gillis Santa, MD  DOB: February 25, 1966  DOS: 10/29/2020  Location: [facility]  MRN: [id number]  Setting: Ambulatory - outpatient  Referring Provider: [name], MD  Type: Established Patient  Specialty: Interventional Pain Management  PCP: [name], MD   Primary Reason for Visit: Interventional Pain Management Treatment. CC: Knee Pain  Procedure:          Anesthesia, Analgesia, Anxiolysis:  Type: Diagnostic Intra-Articular Monovisc Knee Injection #1  Region: Medial infrapatellar Knee Region Level: Knee Joint Laterality: Bilateral  Type: Local Anesthesia Indication(s): Analgesia         Local Anesthetic: Lidocaine 1-2% Route: Infiltration ([place]/IM) IV Access: Declined Sedation: Declined   Position: Sitting   Indications: 1. Chronic pain of both knees   2. Primary osteoarthritis of both knees   3. Chronic pain syndrome    Pain Score: Pre-procedure: 9 /10 Post-procedure: 0-No pain/10   Pre-op H&P Assessment:  Tonya Myers is a 55 y.o. (year old), female patient, seen today for interventional treatment. She  has a past surgical history that includes Colonoscopy with propofol (N/A, 06/16/2015); Breast biopsy (Right, 05/17/2016); Laparoscopic supracervical hysterectomy (2009); Bilateral salpingoophorectomy (2009); Abdominal hysterectomy; Oophorectomy; and Thoracic laminectomy for epidural abscess (Bilateral, 06/17/2020). Tonya Myers has a current medication list which includes  the following prescription(s): acetaminophen, albuterol, aspirin ec, dexcom g6 sensor, diclofenac sodium, diltiazem, duloxetine, ezetimibe, insulin glargine-lixisenatide, levalbuterol, levothyroxine, magnesium oxide, metformin, methocarbamol, montelukast, pantoprazole, pregabalin, rosuvastatin, tramadol, valacyclovir, amoxicillin-clavulanate, and [DISCONTINUED] budesonide-formoterol. Her primarily concern today is the Knee Pain  Initial Vital Signs:  Pulse/HCG Rate: 85  Temp: 97.7 F (36.5 C) Resp: 18 BP: 129/76 SpO2: 100 %  BMI: Estimated body mass index is 24.41 kg/m as calculated from the following:   Height as of this encounter: 6' (1.829 m).   Weight as of this encounter: 180 lb (81.6 kg).  Risk Assessment: Allergies: Reviewed. She has No Known Allergies.  Allergy Precautions: None required Coagulopathies: Reviewed. None identified.  Blood-thinner therapy: None at this time Active Infection(s): Reviewed. None identified. Tonya Myers is afebrile  Site Confirmation: Tonya Myers was asked to confirm the procedure and laterality before marking the site Procedure checklist: Completed Consent: Before the procedure and under the influence of no sedative(s), amnesic(s), or anxiolytics, the patient was informed of the treatment options, risks and possible complications. To fulfill our ethical and legal obligations, as recommended by the American Medical Association's Code of Ethics, I have informed the patient of my clinical impression; the nature and purpose of the treatment or procedure; the risks, benefits, and possible complications of the intervention; the alternatives, including doing nothing; the risk(s) and benefit(s) of the alternative treatment(s) or procedure(s); and the risk(s) and benefit(s) of doing nothing. The patient was provided information about the general risks and possible complications associated with the procedure. These may include, but are not limited to: failure to  achieve desired goals, infection, bleeding, organ or nerve damage, allergic reactions, paralysis, and death. In addition, the patient was informed of those risks and complications associated to the procedure, such as failure to decrease pain; infection; bleeding; organ or nerve  damage with subsequent damage to sensory, motor, and/or autonomic systems, resulting in permanent pain, numbness, and/or weakness of one or several areas of the body; allergic reactions; (i.e.: anaphylactic reaction); and/or death. Furthermore, the patient was informed of those risks and complications associated with the medications. These include, but are not limited to: allergic reactions (i.e.: anaphylactic or anaphylactoid reaction(s)); adrenal axis suppression; blood sugar elevation that in diabetics may result in ketoacidosis or comma; water retention that in patients with history of congestive heart failure may result in shortness of breath, pulmonary edema, and decompensation with resultant heart failure; weight gain; swelling or edema; medication-induced neural toxicity; particulate matter embolism and blood vessel occlusion with resultant organ, and/or nervous system infarction; and/or aseptic necrosis of one or more joints. Finally, the patient was informed that Medicine is not an exact science; therefore, there is also the possibility of unforeseen or unpredictable risks and/or possible complications that may result in a catastrophic outcome. The patient indicated having understood very clearly. We have given the patient no guarantees and we have made no promises. Enough time was given to the patient to ask questions, all of which were answered to the patient's satisfaction. Tonya Myers has indicated that she wanted to continue with the procedure. Attestation: I, the ordering provider, attest that I have discussed with the patient the benefits, risks, side-effects, alternatives, likelihood of achieving goals, and potential  problems during recovery for the procedure that I have provided informed consent. Date  Time: 10/29/2020 10:15 AM  Pre-Procedure Preparation:  Monitoring: As per clinic protocol. Respiration, ETCO2, SpO2, BP, heart rate and rhythm monitor placed and checked for adequate function Safety Precautions: Patient was assessed for positional comfort and pressure points before starting the procedure. Time-out: I initiated and conducted the "Time-out" before starting the procedure, as per protocol. The patient was asked to participate by confirming the accuracy of the "Time Out" information. Verification of the correct person, site, and procedure were performed and confirmed by me, the nursing staff, and the patient. "Time-out" conducted as per Joint Commission's Universal Protocol (UP.01.01.01). Time: 1038  Description of Procedure:          Target Area: Knee Joint Approach: Just above the Medial tibial plateau, lateral to the infrapatellar tendon. Area Prepped: Entire knee area, from the mid-thigh to the mid-shin. DuraPrep (Iodine Povacrylex [0.7% available iodine] and Isopropyl Alcohol, 74% w/w) Safety Precautions: Aspiration looking for blood return was conducted prior to all injections. At no point did we inject any substances, as a needle was being advanced. No attempts were made at seeking any paresthesias. Safe injection practices and needle disposal techniques used. Medications properly checked for expiration dates. SDV (single dose vial) medications used. Description of the Procedure: Protocol guidelines were followed. The patient was placed in position over the fluoroscopy table. The target area was identified and the area prepped in the usual manner. Skin & deeper tissues infiltrated with local anesthetic. Appropriate amount of time allowed to pass for local anesthetics to take effect. The procedure needles were then advanced to the target area. Proper needle placement secured. Negative aspiration  confirmed. Solution injected in intermittent fashion, asking for systemic symptoms every 0.5cc of injectate. The needles were then removed and the area cleansed, making sure to leave some of the prepping solution back to take advantage of its long term bactericidal properties. Vitals:   10/29/20 1015 10/29/20 1045  BP: 129/76 130/83  Pulse: 85 90  Resp: 18 16  Temp: 97.7 F (36.5 C)  TempSrc: Temporal   SpO2: 100% 100%  Weight: 180 lb (81.6 kg)   Height: 6' (1.829 m)     Start Time: 1038 hrs. End Time: 1044 hrs. Materials:  Needle(s) Type: Regular needle Gauge: 25G Length: 1.5-in Medication(s): Please see orders for medications and dosing details. 4 cc of Monovisc solution injected into each knee. Post-operative Assessment:  Post-procedure Vital Signs:  Pulse/HCG Rate: 90  Temp: 97.7 F (36.5 C) Resp: 16 BP: 130/83 SpO2: 100 %  EBL: None  Complications: No immediate post-treatment complications observed by team, or reported by patient.  Note: The patient tolerated the entire procedure well. A repeat set of vitals were taken after the procedure and the patient was kept under observation following institutional policy, for this type of procedure. Post-procedural neurological assessment was performed, showing return to baseline, prior to discharge. The patient was provided with post-procedure discharge instructions, including a section on how to identify potential problems. Should any problems arise concerning this procedure, the patient was given instructions to immediately contact us, at any time, without hesitation. In any case, we plan to contact the patient by telephone for a follow-up status report regarding this interventional procedure.  Comments:  No additional relevant information.  Plan of Care    Medications ordered for procedure: Meds ordered this encounter  Medications   Hyaluronan SOSY 88 mg   Hyaluronan SOSY 88 mg   lidocaine (XYLOCAINE) 2 % (with pres)  injection 400 mg   Medications administered: We administered Hyaluronan, Hyaluronan, and lidocaine.  See the medical record for exact dosing, route, and time of administration.  Follow-up plan:   Return in about 4 weeks (around 11/26/2020) for PP  VV or in person.      B/L knee monovisc #1 10/29/20    Recent Visits Date Type Provider Dept  10/21/20 Office Visit Gillis Santa, MD [facility]  Showing recent visits within past 90 days and meeting all other requirements Today's Visits Date Type Provider Dept  10/29/20 Procedure visit Gillis Santa, MD [facility]  Showing today's visits and meeting all other requirements Future Appointments Date Type Provider Dept  11/26/20 Appointment Gillis Santa, MD [facility]  Showing future appointments within next 90 days and meeting all other requirements Disposition: Discharge home  Discharge (Date  Time): 10/29/2020; 1050 hrs.   Primary Care Physician: [name], MD Location: [facility] Note by: Gillis Santa, MD Date: 10/29/2020; Time: 10:53 AM  Disclaimer:  Medicine is not an exact science. The only guarantee in medicine is that nothing is guaranteed. It is important to note that the decision to proceed with this intervention was based on the information collected from the patient. The Data and conclusions were drawn from the patient's questionnaire, the interview, and the physical examination. Because the information was provided in large part by the patient, it cannot be guaranteed that it has not been purposely or unconsciously manipulated. Every effort has been made to obtain as much relevant data as possible for this evaluation. It is important to note that the conclusions that lead to this procedure are derived in large part from the available data. Always take into account that the treatment will also be dependent on availability of resources and existing treatment guidelines,  considered by other Pain Management Practitioners as being common knowledge and practice, at the time of the intervention. For Medico-Legal purposes, it is also important to point out that variation in procedural techniques and pharmacological choices are the acceptable  norm. The indications, contraindications, technique, and results of the above procedure should only be interpreted and judged by a Board-Certified Interventional Pain Specialist with extensive familiarity and expertise in the same exact procedure and technique.

## 2020-10-29 NOTE — Patient Instructions (Signed)

## 2020-10-30 ENCOUNTER — Telehealth: Payer: Self-pay

## 2020-10-30 NOTE — Telephone Encounter (Signed)
Post procedure phone call.  Patient states she is doing ok.  

## 2020-11-04 ENCOUNTER — Ambulatory Visit (HOSPITAL_BASED_OUTPATIENT_CLINIC_OR_DEPARTMENT_OTHER): Payer: Medicare Other | Admitting: Infectious Diseases

## 2020-11-04 ENCOUNTER — Other Ambulatory Visit: Payer: Self-pay

## 2020-11-04 DIAGNOSIS — G001 Pneumococcal meningitis: Secondary | ICD-10-CM | POA: Diagnosis not present

## 2020-11-04 NOTE — Progress Notes (Signed)
The purpose of this virtual visit is to provide medical care while limiting exposure to the novel coronavirus (COVID19) for both patient and office staff.   Consent was obtained for phone visit:  Yes.   Answered questions that patient had about telehealth interaction:  Yes.   I discussed the limitations, risks, security and privacy concerns of performing an evaluation and management service by telephone. I also discussed with the patient that there may be a patient responsible charge related to this service. The patient expressed understanding and agreed to proceed.   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 She was treated wih 6 weeks of IV antibiotics until 07/23/20. She pent a month in rehab 3/4-07/31/20 She was in [facility] 2/14-06/27/20 prior to that. Since being home she is working with PT , has been steadily improving She wanted to know whether it was herpes simplex she has that was the cause of pneumococcal infection. I told her that her underlying DM, receipt of steroids for left hip and left sciatic n jan/early feb, underlying CLL would all have put her at risk for infection. Herpes per se was not the reason. To prevent future infection- she should take pneumococcal vaccines 13/22  Keep her blood glucose under good control and avoid steroid injections to the joints and back.  On reviewing medical records she got Pneumovac ( 23) in Oct 2018. She will go to her PCP to get prevnar 13 .  Spent 11 min talking to patients and answering her questions to her satisfaction. Follow up as needed Asked her to avoid steroid injections Follow up as needed

## 2020-11-05 ENCOUNTER — Other Ambulatory Visit: Payer: Self-pay

## 2020-11-05 ENCOUNTER — Ambulatory Visit
Admission: RE | Admit: 2020-11-05 | Discharge: 2020-11-05 | Disposition: A | Discharge status: Home / Self Care | Payer: Medicare Other | Source: Ambulatory Visit | Attending: Neurology | Admitting: Neurology

## 2020-11-05 DIAGNOSIS — R209 Unspecified disturbances of skin sensation: Secondary | ICD-10-CM | POA: Diagnosis present

## 2020-11-05 DIAGNOSIS — G001 Pneumococcal meningitis: Secondary | ICD-10-CM | POA: Diagnosis present

## 2020-11-05 IMAGING — US US EXTREM UP DUPLEX ARTERIAL BILAT
1 series · 13 of 25 positions shown · non-contrast
Comparison: None.

CLINICAL DATA: Numbness, tingling and coldness in the upper
extremities

EXAM:
BILATERAL UPPER EXTREMITY ARTERIAL DUPLEX SCAN
TECHNIQUE: Gray-scale sonography as well as color Doppler and duplex ultrasound
was performed to evaluate the arteries of both upper extremities.

[Series 1: us extrem up duplex arterial bilat · 0.06mm/px · 13 of 58 slices shown]
[im 1/58]
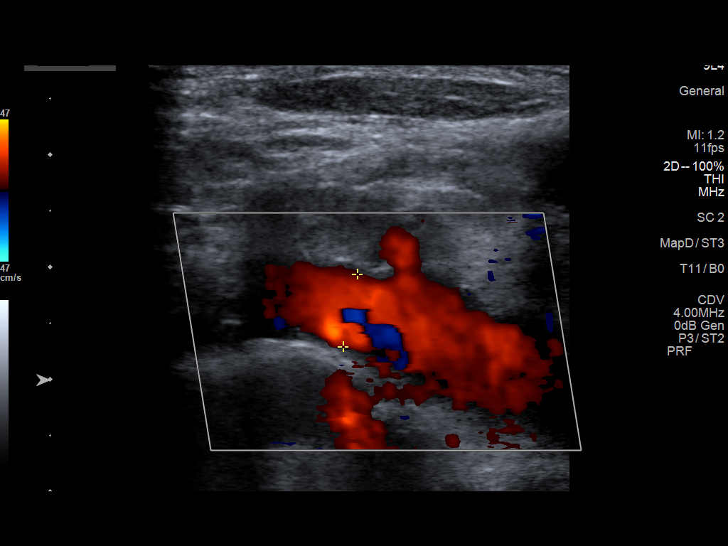
[im 5/58]
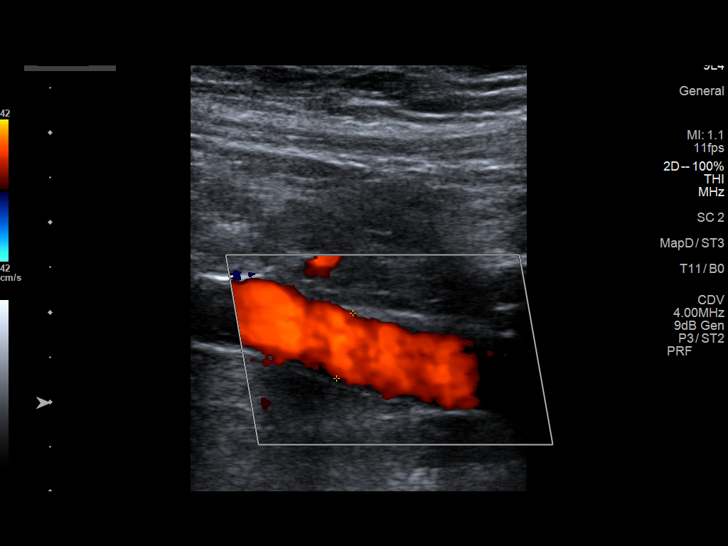
[im 10/58]
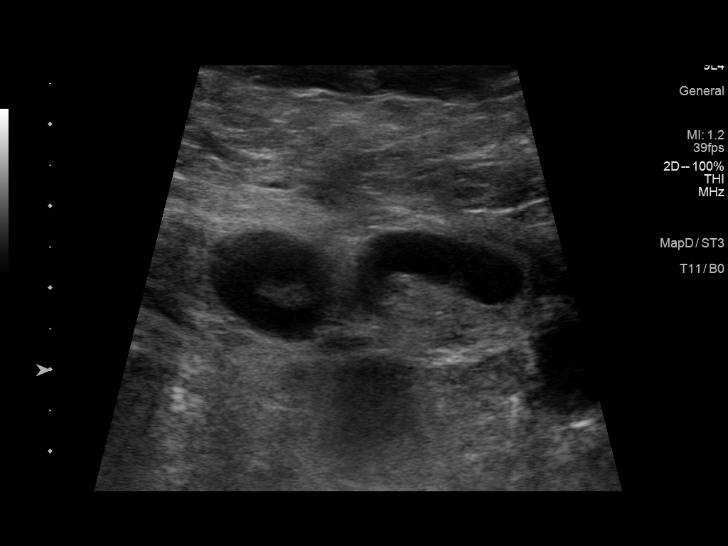
[im 15/58]
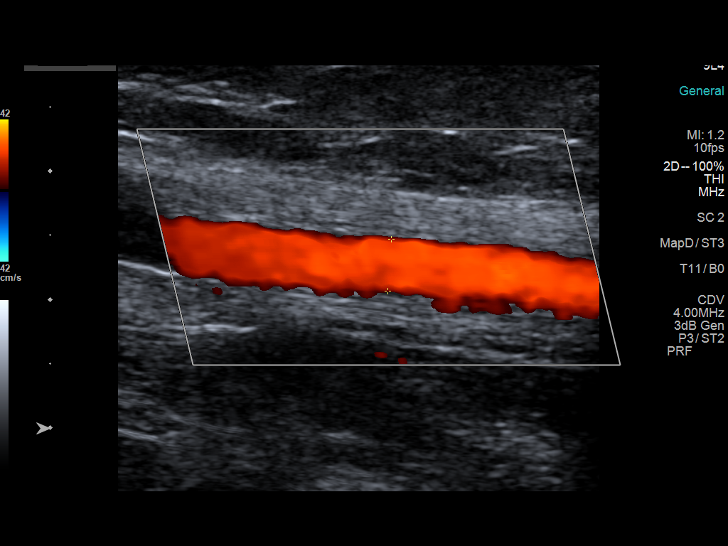
[im 20/58]
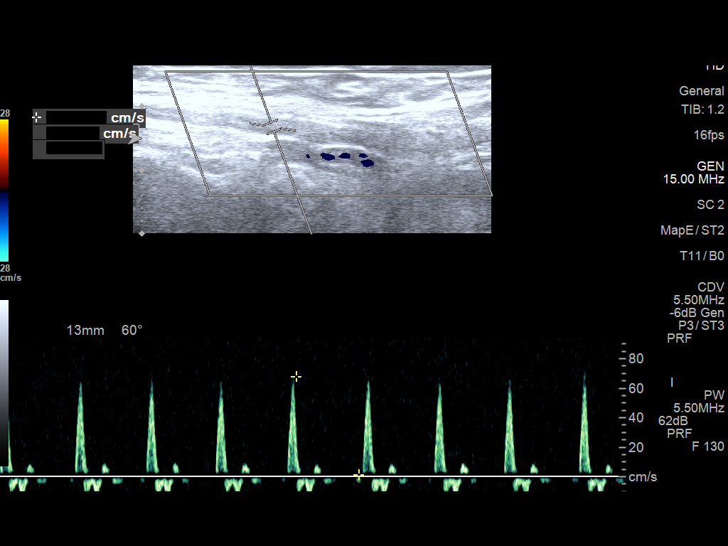
[im 24/58]
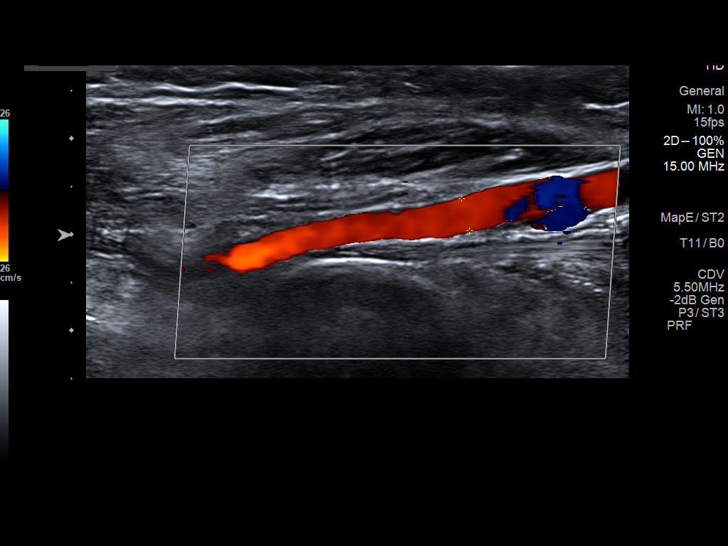
[im 29/58]
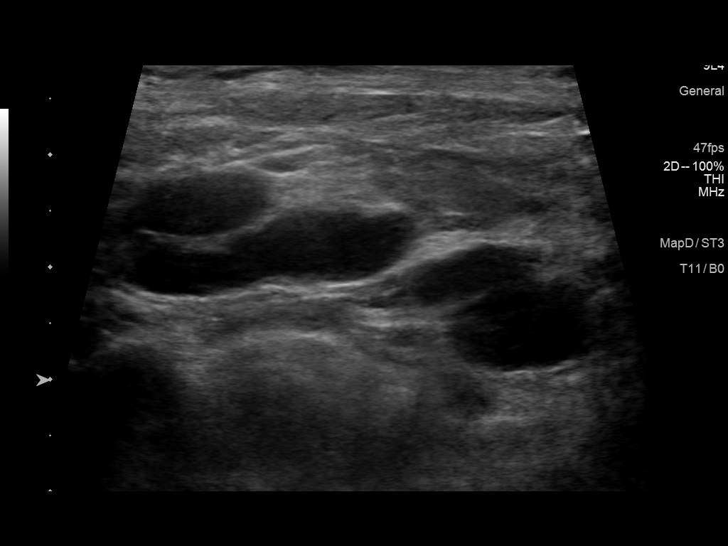
[im 34/58]
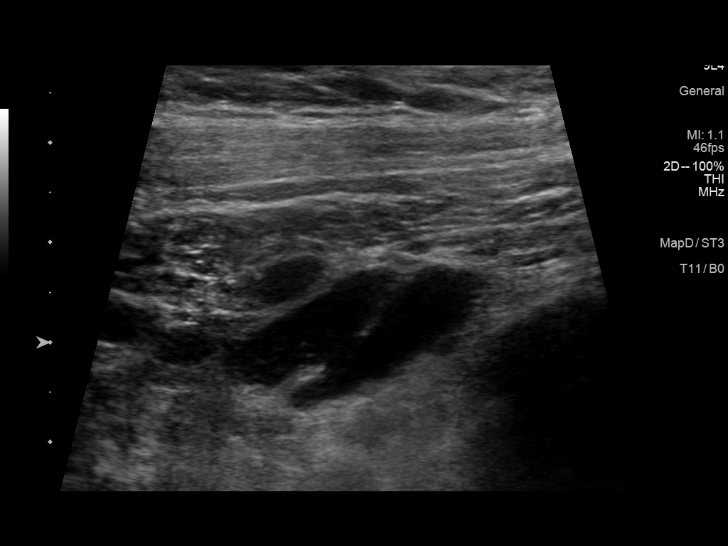
[im 39/58]
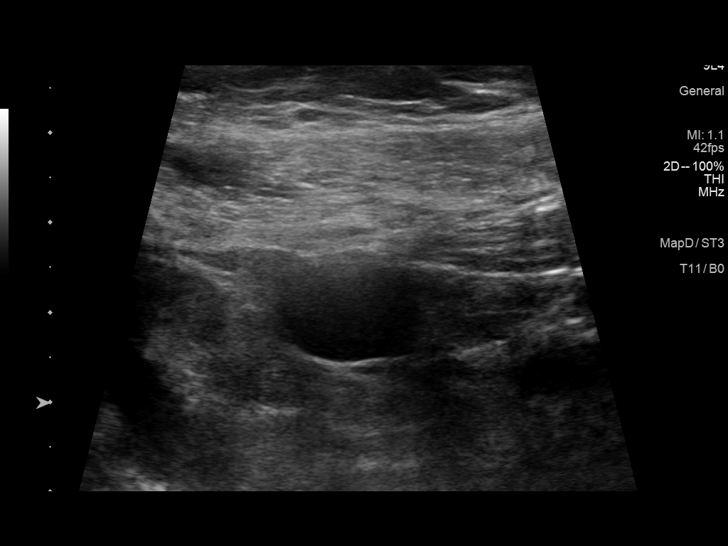
[im 43/58]
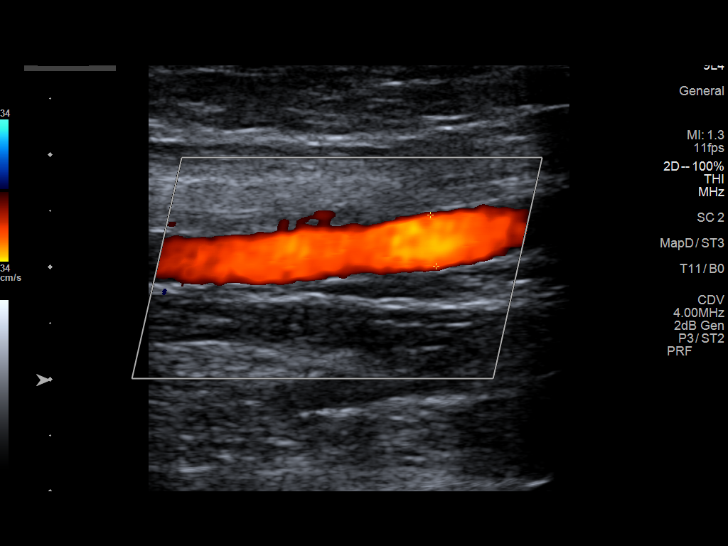
[im 48/58]
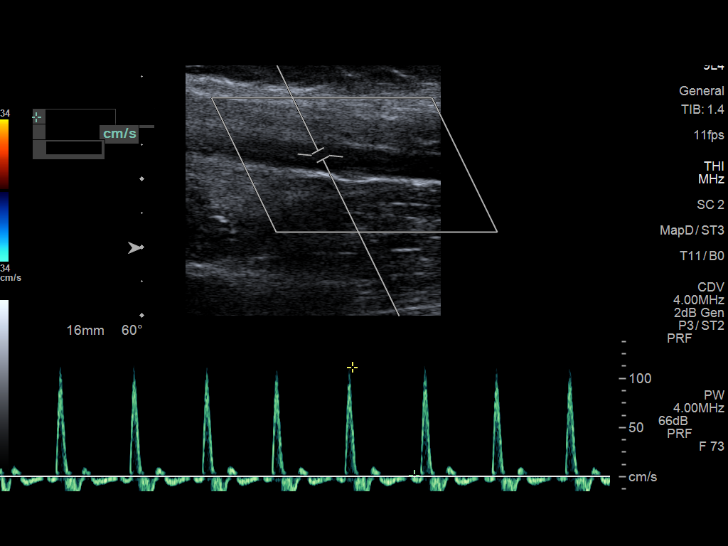
[im 53/58]
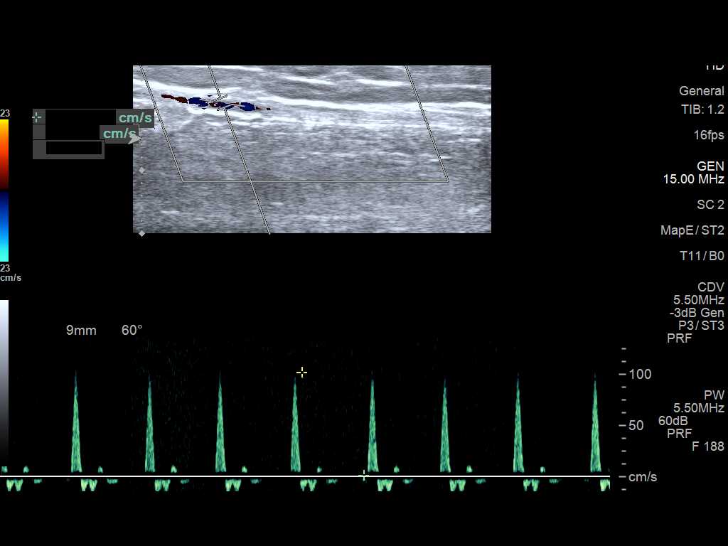
[im 58/58]
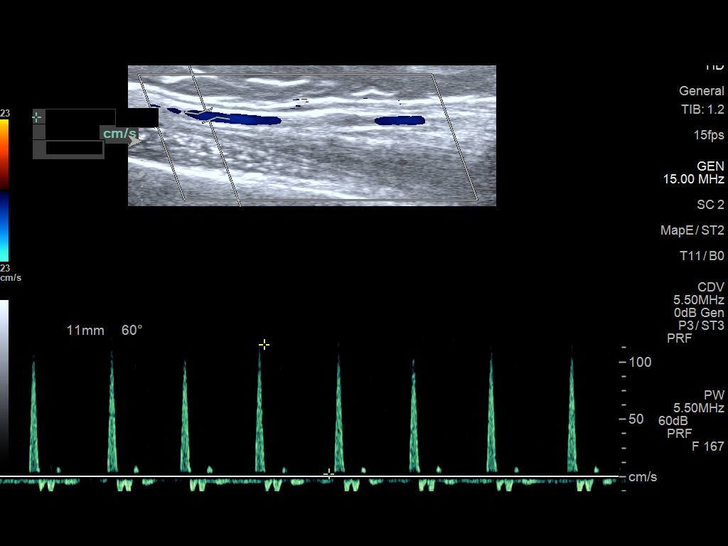

[13 of 25 positions shown; findings below may reference images not displayed]

FINDINGS: Right upper extremity: Normal triphasic arterial waveforms
throughout the subclavian, axillary, brachial, radial and ulnar
artery. No focal atherosclerotic plaque. No evidence of stenosis or
occlusion.

Left upper extremity: Normal triphasic arterial waveforms throughout
the subclavian, axillary, brachial, radial and ulnar artery. No
focal atherosclerotic plaque. No evidence of stenosis or occlusion.
IMPRESSION: 1. No evidence of hemodynamically significant stenosis or occlusion
in either upper extremity arterial tree.
2. Incidentally and incompletely imaged prominent lymph nodes in the
axillary spaces bilaterally. These are of uncertain significance and
may be reactive in nature. If the patient has palpable
lymphadenopathy, or other signs and symptoms suspicious for a
lymphoproliferative process, then further imaging (contrast-enhanced
CT scan of the chest) may be warranted.

## 2020-11-11 ENCOUNTER — Other Ambulatory Visit: Payer: Self-pay

## 2020-11-11 ENCOUNTER — Encounter: Payer: Self-pay | Admitting: Obstetrics and Gynecology

## 2020-11-11 ENCOUNTER — Ambulatory Visit (INDEPENDENT_AMBULATORY_CARE_PROVIDER_SITE_OTHER): Payer: Medicare Other | Admitting: Obstetrics and Gynecology

## 2020-11-11 VITALS — BP 120/80 | Ht 72.0 in | Wt 182.0 lb

## 2020-11-11 DIAGNOSIS — N898 Other specified noninflammatory disorders of vagina: Secondary | ICD-10-CM

## 2020-11-11 DIAGNOSIS — N760 Acute vaginitis: Secondary | ICD-10-CM | POA: Diagnosis not present

## 2020-11-11 DIAGNOSIS — B9689 Other specified bacterial agents as the cause of diseases classified elsewhere: Secondary | ICD-10-CM

## 2020-11-11 LAB — POCT WET PREP WITH KOH
Clue Cells Wet Prep HPF POC: POSITIVE
KOH Prep POC: POSITIVE — AB
Trichomonas, UA: NEGATIVE
Yeast Wet Prep HPF POC: NEGATIVE

## 2020-11-11 MED ORDER — METRONIDAZOLE 0.75 % VA GEL: 1.0000 | Freq: Every day | VAGINAL | 0 refills | Status: AC

## 2020-11-11 MED ORDER — FLUCONAZOLE 150 MG PO TABS: 150.0000 mg | ORAL_TABLET | Freq: Once | ORAL | 0 refills | Status: AC

## 2020-11-11 NOTE — Patient Instructions (Signed)
I value your feedback and you entrusting us with your care. If you get a [place] patient survey, I would appreciate you taking the time to let us know about your experience today. Thank you! ? ? ?

## 2020-11-11 NOTE — Progress Notes (Signed)
[name], MD   Chief Complaint  Patient presents with   Vaginal Itching    Not sure of discharge, no odor x 1 week    HPI:      [name] is a 55 y.o. No obstetric history on file. whose LMP was No LMP recorded. Patient has had a hysterectomy., presents today for vaginal itching without increased d/c, odor for the past couple of wks. Had covid with subsequent meningitis and was in hospital for over 50 days; treated with multiple abx. Was given diflucan a couple wks ago for yeast vag sx but still having itching. Pt was given medication spray for vaginal cleaning but she has since stopped. DM has been well controlled.  Hx of BV in past as well as yeast vag. Hx of type 2 HSV. Her menses are absent due to supracx hyst with BSO due to [place] in 2009. She does not have postmenopausal bleeding.  Past Medical History:  Diagnosis Date   Acute hemorrhoid 03/11/2015   Anxiety    Arthritis    Asthma    Cancer ([place])    Chronic back pain    CLL (chronic lymphocytic leukemia) ([place])    Depression    Diabetes mellitus without complication ([place])    Genital herpes    type 2   Hypertension    Hypothyroidism    Vertigo     Past Surgical History:  Procedure Laterality Date   ABDOMINAL HYSTERECTOMY     BILATERAL SALPINGOOPHORECTOMY  2009   BREAST BIOPSY Right 05/17/2016   FIBROADENOMATOUS CHANGE AND SCLEROSING ADENOSIS WITH   COLONOSCOPY WITH PROPOFOL N/A 06/16/2015   Procedure: COLONOSCOPY WITH PROPOFOL;  Surgeon: Tonya Class, MD;  Location: [facility]COPY;  Service: Endoscopy;  Laterality: N/A;   LAPAROSCOPIC SUPRACERVICAL HYSTERECTOMY  2009   due to [place] Bilateral 06/17/2020   Procedure: THORACIC LAMINECTOMY FOR EPIDURAL ABSCESS;  Surgeon: Tonya Perla, MD;  Location: [facility] ORS;  Service: Neurosurgery;  Laterality: Bilateral;    Family History  Problem Relation Age of Onset   Cancer Paternal Aunt     Breast cancer Maternal Aunt 70    Social History   Socioeconomic History   Marital status: Single    Spouse name: Not on file   Number of children: Not on file   Years of education: Not on file   Highest education level: Not on file  Occupational History   Not on file  Tobacco Use   Smoking status: Never   Smokeless tobacco: Never  Vaping Use   Vaping Use: Never used  Substance and Sexual Activity   Alcohol use: Yes    Alcohol/week: 1.0 standard drink    Types: 1 Cans of beer per week   Drug use: No   Sexual activity: Not Currently    Birth control/protection: None  Other Topics Concern   Not on file  Social History Narrative   Not on file   Social Determinants of Health   Financial Resource Strain: Not on file  Food Insecurity: Not on file  Transportation Needs: Not on file  Physical Activity: Not on file  Stress: Not on file  Social Connections: Not on file  Intimate Partner Violence: Not on file    Outpatient Medications Prior to Visit  Medication Sig Dispense Refill   acetaminophen (TYLENOL) 325 MG tablet Take 2 tablets (650 mg total) by mouth every 6 (six) hours as needed  for mild pain (or Fever >/= 101).     albuterol (VENTOLIN HFA) 108 (90 Base) MCG/ACT inhaler Inhale into the lungs.     aspirin EC 81 MG tablet Take 1 tablet (81 mg total) by mouth daily. Swallow whole. 30 tablet 11   Continuous Blood Gluc Sensor (DEXCOM G6 SENSOR) MISC SMARTSIG:1 Each Topical Every 10 Days     diclofenac Sodium (VOLTAREN) 1 % GEL Apply 2 g topically 4 (four) times daily. 2 g 0   diltiazem (DILACOR XR) 120 MG 24 hr capsule Take 1 capsule (120 mg total) by mouth daily. 30 capsule 0   DULoxetine (CYMBALTA) 30 MG capsule Take 1 capsule (30 mg total) by mouth at bedtime. X 1 week- then 2 capsule/60 mg nightly- for nerve pain- 60 capsule 3   ezetimibe (ZETIA) 10 MG tablet Take 1 tablet (10 mg total) by mouth daily. 30 tablet 0   Insulin Glargine-Lixisenatide 100-33 UNT-MCG/ML SOPN  Inject into the skin.     levalbuterol (XOPENEX) 1.25 MG/3ML nebulizer solution Take 1.25 mg by nebulization every 6 (six) hours as needed.     levothyroxine (SYNTHROID) 150 MCG tablet Take 1 tablet (150 mcg total) by mouth daily before breakfast. 30 tablet 0   magnesium oxide (MAG-OX) 400 MG tablet Take by mouth.     metFORMIN (GLUCOPHAGE-XR) 500 MG 24 hr tablet Take 1 tablet (500 mg total) by mouth 2 (two) times daily. 60 tablet 0   methocarbamol (ROBAXIN) 500 MG tablet Take 1 tablet (500 mg total) by mouth 3 (three) times daily. 90 tablet 0   montelukast (SINGULAIR) 10 MG tablet Take 1 tablet (10 mg total) by mouth at bedtime. 30 tablet 0   pantoprazole (PROTONIX) 40 MG tablet Take 1 tablet (40 mg total) by mouth daily. 30 tablet 0   pregabalin (LYRICA) 100 MG capsule Take by mouth 2 (two) times daily.     rosuvastatin (CRESTOR) 10 MG tablet Take 1 tablet (10 mg total) by mouth at bedtime. 30 tablet 0   traMADol (ULTRAM) 50 MG tablet Take 1 tablet (50 mg total) by mouth every 6 (six) hours as needed for moderate pain. 60 tablet 5   valACYclovir (VALTREX) 1000 MG tablet Take 1 tablet (1,000 mg total) by mouth 2 (two) times daily. 60 tablet 1   No facility-administered medications prior to visit.      ROS:  Review of Systems  Constitutional:  Negative for fever.  Gastrointestinal:  Negative for blood in stool, constipation, diarrhea, nausea and vomiting.  Genitourinary:  Negative for dyspareunia, dysuria, flank pain, frequency, hematuria, urgency, vaginal bleeding, vaginal discharge and vaginal pain.  Musculoskeletal:  Negative for back pain.  Skin:  Negative for rash.  BREAST: No symptoms   OBJECTIVE:   Vitals:  BP 120/80   Ht 6' (1.829 m)   Wt 182 lb (82.6 kg)   BMI 24.68 kg/m   Physical Exam Constitutional:      Appearance: Normal appearance.  Pulmonary:     Effort: Pulmonary effort is normal.  Genitourinary:    Labia:        Right: Rash present. No tenderness or  lesion.        Left: Rash present. No tenderness or lesion.      Vagina: Vaginal discharge present.     Comments: BILAT LABIA MINORA WITH ERYTHEMA, NO LESIONS QTIP ONLY EXAM DONE DUE TO LIMITED MOBILITY FOR EXAM Musculoskeletal:        General: Normal range of motion.  Neurological:  Mental Status: She is alert and oriented to person, place, and time.  Psychiatric:        Judgment: Judgment normal.    Results: Results for orders placed or performed in visit on 11/11/20 (from the past 24 hour(s))  POCT Wet Prep with KOH     Status: Abnormal   Collection Time: 11/11/20  4:53 PM  Result Value Ref Range   Trichomonas, UA Negative    Clue Cells Wet Prep HPF POC pos    Epithelial Wet Prep HPF POC     Yeast Wet Prep HPF POC neg    Bacteria Wet Prep HPF POC     RBC Wet Prep HPF POC     WBC Wet Prep HPF POC     KOH Prep POC Positive (A) Negative     Assessment/Plan: BV (bacterial vaginosis) - Plan: metroNIDAZOLE (METROGEL) 0.75 % vaginal gel, POCT Wet Prep with KOH; pos wet prep. Rx metrogel. F/u prn.   Vaginal itching - Plan: fluconazole (DIFLUCAN) 150 MG tablet, POCT Wet Prep with KOH; no yeast on wet prep. Treat empirically with addl diflucan, Rx eRxd. Question BV vs chem derm from Rx soap. Pt has stopped using. F/u prn.    Meds ordered this encounter  Medications   metroNIDAZOLE (METROGEL) 0.75 % vaginal gel    Sig: Place 1 Applicatorful vaginally at bedtime for 5 days.    Dispense:  50 g    Refill:  0    Order Specific Question:   Supervising Provider    Answer:   Gae Dry [[id number]]   fluconazole (DIFLUCAN) 150 MG tablet    Sig: Take 1 tablet (150 mg total) by mouth once for 1 dose. May repeat in 3 days if still having symptoms    Dispense:  2 tablet    Refill:  0    Order Specific Question:   Supervising Provider    Answer:   Gae Dry [[id number]]      Return if symptoms worsen or fail to improve.  Aleighna Wojtas B. Shontavia Mickel, PA-C 11/11/2020 4:54  PM

## 2020-11-19 ENCOUNTER — Emergency Department: Payer: Medicare Other

## 2020-11-19 ENCOUNTER — Other Ambulatory Visit: Payer: Self-pay

## 2020-11-19 ENCOUNTER — Observation Stay: Payer: Medicare Other

## 2020-11-19 ENCOUNTER — Inpatient Hospital Stay
Admission: EM | Admit: 2020-11-19 | Discharge: 2020-12-22 | DRG: 698 | Disposition: A | Discharge status: Home Health | Payer: Medicare Other | Attending: Internal Medicine | Admitting: Internal Medicine

## 2020-11-19 DIAGNOSIS — Z803 Family history of malignant neoplasm of breast: Secondary | ICD-10-CM

## 2020-11-19 DIAGNOSIS — K859 Acute pancreatitis without necrosis or infection, unspecified: Secondary | ICD-10-CM

## 2020-11-19 DIAGNOSIS — Z880 Allergy status to penicillin: Secondary | ICD-10-CM

## 2020-11-19 DIAGNOSIS — Z20822 Contact with and (suspected) exposure to covid-19: Secondary | ICD-10-CM | POA: Diagnosis present

## 2020-11-19 DIAGNOSIS — G9340 Encephalopathy, unspecified: Secondary | ICD-10-CM | POA: Diagnosis present

## 2020-11-19 DIAGNOSIS — I959 Hypotension, unspecified: Secondary | ICD-10-CM | POA: Diagnosis not present

## 2020-11-19 DIAGNOSIS — M170 Bilateral primary osteoarthritis of knee: Secondary | ICD-10-CM | POA: Diagnosis present

## 2020-11-19 DIAGNOSIS — Z7951 Long term (current) use of inhaled steroids: Secondary | ICD-10-CM

## 2020-11-19 DIAGNOSIS — M3131 Wegener's granulomatosis with renal involvement: Secondary | ICD-10-CM | POA: Diagnosis present

## 2020-11-19 DIAGNOSIS — R059 Cough, unspecified: Secondary | ICD-10-CM

## 2020-11-19 DIAGNOSIS — C9110 Chronic lymphocytic leukemia of B-cell type not having achieved remission: Secondary | ICD-10-CM

## 2020-11-19 DIAGNOSIS — M5030 Other cervical disc degeneration, unspecified cervical region: Secondary | ICD-10-CM | POA: Diagnosis present

## 2020-11-19 DIAGNOSIS — R6520 Severe sepsis without septic shock: Secondary | ICD-10-CM

## 2020-11-19 DIAGNOSIS — Z90722 Acquired absence of ovaries, bilateral: Secondary | ICD-10-CM

## 2020-11-19 DIAGNOSIS — R29818 Other symptoms and signs involving the nervous system: Secondary | ICD-10-CM | POA: Diagnosis present

## 2020-11-19 DIAGNOSIS — R768 Other specified abnormal immunological findings in serum: Secondary | ICD-10-CM | POA: Diagnosis present

## 2020-11-19 DIAGNOSIS — E1165 Type 2 diabetes mellitus with hyperglycemia: Secondary | ICD-10-CM | POA: Diagnosis not present

## 2020-11-19 DIAGNOSIS — R4701 Aphasia: Secondary | ICD-10-CM | POA: Diagnosis present

## 2020-11-19 DIAGNOSIS — G0490 Encephalitis and encephalomyelitis, unspecified: Secondary | ICD-10-CM | POA: Diagnosis present

## 2020-11-19 DIAGNOSIS — G8929 Other chronic pain: Secondary | ICD-10-CM | POA: Diagnosis present

## 2020-11-19 DIAGNOSIS — I2510 Atherosclerotic heart disease of native coronary artery without angina pectoris: Secondary | ICD-10-CM | POA: Diagnosis present

## 2020-11-19 DIAGNOSIS — D696 Thrombocytopenia, unspecified: Secondary | ICD-10-CM

## 2020-11-19 DIAGNOSIS — E039 Hypothyroidism, unspecified: Secondary | ICD-10-CM | POA: Diagnosis present

## 2020-11-19 DIAGNOSIS — N170 Acute kidney failure with tubular necrosis: Secondary | ICD-10-CM | POA: Diagnosis not present

## 2020-11-19 DIAGNOSIS — N051 Unspecified nephritic syndrome with focal and segmental glomerular lesions: Secondary | ICD-10-CM | POA: Diagnosis not present

## 2020-11-19 DIAGNOSIS — G928 Other toxic encephalopathy: Secondary | ICD-10-CM | POA: Diagnosis present

## 2020-11-19 DIAGNOSIS — I10 Essential (primary) hypertension: Secondary | ICD-10-CM | POA: Diagnosis present

## 2020-11-19 DIAGNOSIS — E11649 Type 2 diabetes mellitus with hypoglycemia without coma: Secondary | ICD-10-CM | POA: Diagnosis not present

## 2020-11-19 DIAGNOSIS — E876 Hypokalemia: Secondary | ICD-10-CM | POA: Diagnosis present

## 2020-11-19 DIAGNOSIS — N179 Acute kidney failure, unspecified: Secondary | ICD-10-CM | POA: Diagnosis present

## 2020-11-19 DIAGNOSIS — E1151 Type 2 diabetes mellitus with diabetic peripheral angiopathy without gangrene: Secondary | ICD-10-CM | POA: Diagnosis present

## 2020-11-19 DIAGNOSIS — R509 Fever, unspecified: Secondary | ICD-10-CM

## 2020-11-19 DIAGNOSIS — R197 Diarrhea, unspecified: Secondary | ICD-10-CM | POA: Diagnosis not present

## 2020-11-19 DIAGNOSIS — D8989 Other specified disorders involving the immune mechanism, not elsewhere classified: Secondary | ICD-10-CM

## 2020-11-19 DIAGNOSIS — Z856 Personal history of leukemia: Secondary | ICD-10-CM

## 2020-11-19 DIAGNOSIS — B370 Candidal stomatitis: Secondary | ICD-10-CM

## 2020-11-19 DIAGNOSIS — Z79899 Other long term (current) drug therapy: Secondary | ICD-10-CM

## 2020-11-19 DIAGNOSIS — Z8661 Personal history of infections of the central nervous system: Secondary | ICD-10-CM

## 2020-11-19 DIAGNOSIS — D638 Anemia in other chronic diseases classified elsewhere: Secondary | ICD-10-CM | POA: Diagnosis present

## 2020-11-19 DIAGNOSIS — I82409 Acute embolism and thrombosis of unspecified deep veins of unspecified lower extremity: Secondary | ICD-10-CM

## 2020-11-19 DIAGNOSIS — R4182 Altered mental status, unspecified: Secondary | ICD-10-CM | POA: Diagnosis not present

## 2020-11-19 DIAGNOSIS — G9341 Metabolic encephalopathy: Secondary | ICD-10-CM | POA: Diagnosis present

## 2020-11-19 DIAGNOSIS — E782 Mixed hyperlipidemia: Secondary | ICD-10-CM | POA: Diagnosis present

## 2020-11-19 DIAGNOSIS — E119 Type 2 diabetes mellitus without complications: Secondary | ICD-10-CM

## 2020-11-19 DIAGNOSIS — Z7982 Long term (current) use of aspirin: Secondary | ICD-10-CM

## 2020-11-19 DIAGNOSIS — Z79890 Hormone replacement therapy (postmenopausal): Secondary | ICD-10-CM

## 2020-11-19 DIAGNOSIS — T508X5A Adverse effect of diagnostic agents, initial encounter: Secondary | ICD-10-CM | POA: Diagnosis not present

## 2020-11-19 DIAGNOSIS — F419 Anxiety disorder, unspecified: Secondary | ICD-10-CM | POA: Diagnosis present

## 2020-11-19 DIAGNOSIS — A419 Sepsis, unspecified organism: Principal | ICD-10-CM

## 2020-11-19 DIAGNOSIS — E872 Acidosis: Secondary | ICD-10-CM | POA: Diagnosis not present

## 2020-11-19 DIAGNOSIS — R410 Disorientation, unspecified: Secondary | ICD-10-CM

## 2020-11-19 DIAGNOSIS — F32A Depression, unspecified: Secondary | ICD-10-CM | POA: Diagnosis present

## 2020-11-19 DIAGNOSIS — D61818 Other pancytopenia: Secondary | ICD-10-CM

## 2020-11-19 DIAGNOSIS — C9111 Chronic lymphocytic leukemia of B-cell type in remission: Secondary | ICD-10-CM | POA: Diagnosis present

## 2020-11-19 DIAGNOSIS — I7789 Other specified disorders of arteries and arterioles: Secondary | ICD-10-CM | POA: Diagnosis present

## 2020-11-19 DIAGNOSIS — M317 Microscopic polyangiitis: Secondary | ICD-10-CM | POA: Diagnosis present

## 2020-11-19 DIAGNOSIS — Z90711 Acquired absence of uterus with remaining cervical stump: Secondary | ICD-10-CM

## 2020-11-19 DIAGNOSIS — R001 Bradycardia, unspecified: Secondary | ICD-10-CM | POA: Diagnosis not present

## 2020-11-19 DIAGNOSIS — E871 Hypo-osmolality and hyponatremia: Secondary | ICD-10-CM | POA: Diagnosis not present

## 2020-11-19 DIAGNOSIS — T380X5A Adverse effect of glucocorticoids and synthetic analogues, initial encounter: Secondary | ICD-10-CM | POA: Diagnosis not present

## 2020-11-19 DIAGNOSIS — F40240 Claustrophobia: Secondary | ICD-10-CM | POA: Diagnosis present

## 2020-11-19 DIAGNOSIS — E569 Vitamin deficiency, unspecified: Secondary | ICD-10-CM | POA: Diagnosis present

## 2020-11-19 DIAGNOSIS — Z993 Dependence on wheelchair: Secondary | ICD-10-CM

## 2020-11-19 DIAGNOSIS — Y92239 Unspecified place in hospital as the place of occurrence of the external cause: Secondary | ICD-10-CM | POA: Diagnosis not present

## 2020-11-19 DIAGNOSIS — Z794 Long term (current) use of insulin: Secondary | ICD-10-CM

## 2020-11-19 LAB — URINALYSIS, COMPLETE (UACMP) WITH MICROSCOPIC
Bacteria, UA: NONE SEEN
Bilirubin Urine: NEGATIVE
Glucose, UA: NEGATIVE mg/dL
Ketones, ur: 20 mg/dL — AB
Leukocytes,Ua: NEGATIVE
Nitrite: NEGATIVE
Protein, ur: >=300 mg/dL — AB
Specific Gravity, Urine: 1.017 (ref 1.005–1.030)
pH: 6 (ref 5.0–8.0)

## 2020-11-19 LAB — CBC
HCT: 29.4 % — ABNORMAL LOW (ref 36.0–46.0)
Hemoglobin: 9.2 g/dL — ABNORMAL LOW (ref 12.0–15.0)
MCH: 26 pg (ref 26.0–34.0)
MCHC: 31.3 g/dL (ref 30.0–36.0)
MCV: 83.1 fL (ref 80.0–100.0)
Platelets: 72 10*3/uL — ABNORMAL LOW (ref 150–400)
RBC: 3.54 MIL/uL — ABNORMAL LOW (ref 3.87–5.11)
RDW: 16.2 % — ABNORMAL HIGH (ref 11.5–15.5)
WBC: 2.4 10*3/uL — ABNORMAL LOW (ref 4.0–10.5)
nRBC: 0 % (ref 0.0–0.2)

## 2020-11-19 LAB — COMPREHENSIVE METABOLIC PANEL
ALT: 5 U/L (ref 0–44)
AST: 54 U/L — ABNORMAL HIGH (ref 15–41)
Albumin: 2.7 g/dL — ABNORMAL LOW (ref 3.5–5.0)
Alkaline Phosphatase: 68 U/L (ref 38–126)
Anion gap: 11 (ref 5–15)
BUN: 9 mg/dL (ref 6–20)
CO2: 25 mmol/L (ref 22–32)
Calcium: 8.3 mg/dL — ABNORMAL LOW (ref 8.9–10.3)
Chloride: 105 mmol/L (ref 98–111)
Creatinine, Ser: 0.75 mg/dL (ref 0.44–1.00)
GFR, Estimated: >60 mL/min (ref >60)
Glucose, Bld: 157 mg/dL — ABNORMAL HIGH (ref 70–99)
Potassium: 2.8 mmol/L — ABNORMAL LOW (ref 3.5–5.1)
Sodium: 141 mmol/L (ref 135–145)
Total Bilirubin: 1.3 mg/dL — ABNORMAL HIGH (ref 0.3–1.2)
Total Protein: 7.1 g/dL (ref 6.5–8.1)

## 2020-11-19 LAB — LACTIC ACID, PLASMA
Lactic Acid, Venous: 2 mmol/L (ref 0.5–1.9)
Lactic Acid, Venous: 1.9 mmol/L (ref 0.5–1.9)

## 2020-11-19 LAB — RESP PANEL BY RT-PCR (FLU A&B, COVID) ARPGX2
Influenza A by PCR: NEGATIVE
Influenza B by PCR: NEGATIVE
SARS Coronavirus 2 by RT PCR: NEGATIVE

## 2020-11-19 IMAGING — CT CT HEAD W/O CM
4 series · 17 of 47 positions shown, 19 images · non-contrast
Comparison: [DATE]

CLINICAL DATA: Mental status changes of unknown cause.

EXAM:
CT HEAD WITHOUT CONTRAST
TECHNIQUE: Contiguous axial images were obtained from the base of the skull
through the vertex without intravenous contrast.

[Series 2: head bone · axial · 0.39mm/px · z∈[+557,+607]mm · 4 of 73 slices shown]
[im 8/73  bone]
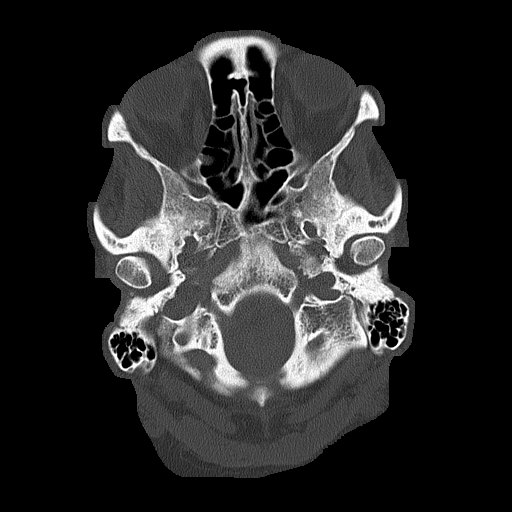
[im 15/73  bone]
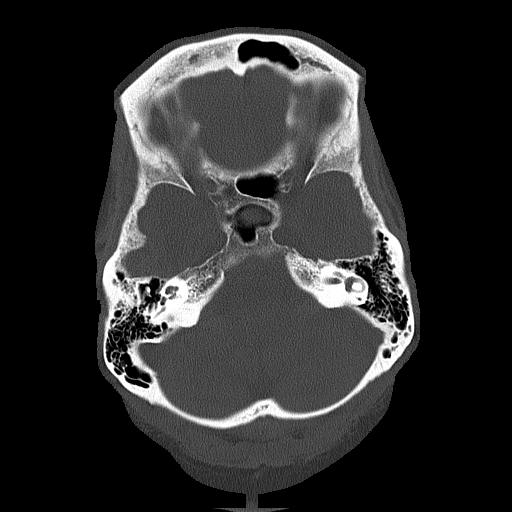
[im 22/73  bone]
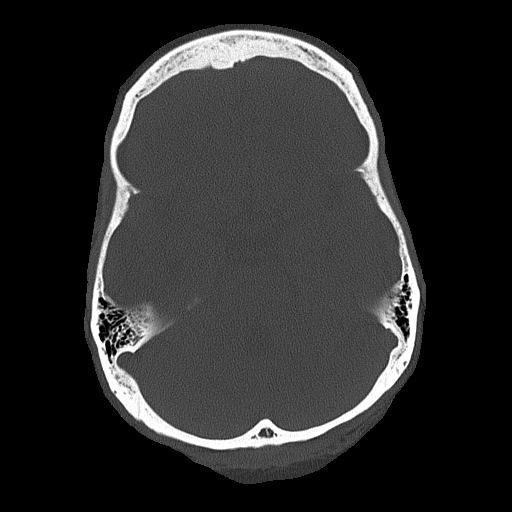
[im 33/73  bone]
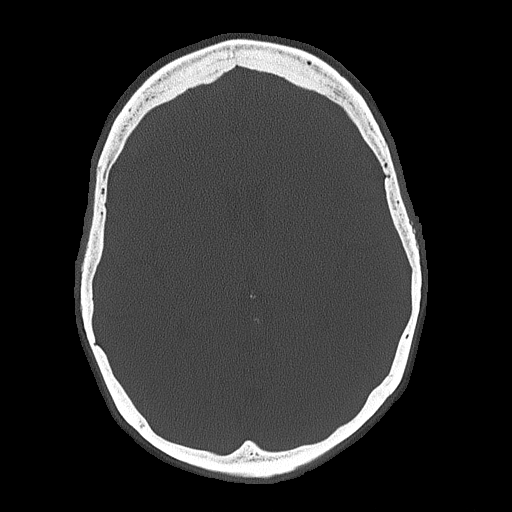

[Series 3: head wo · axial · 0.39mm/px · z∈[+558,+663]mm · 7 of 29 slices shown, 9 images]
[im 4/29  brain]
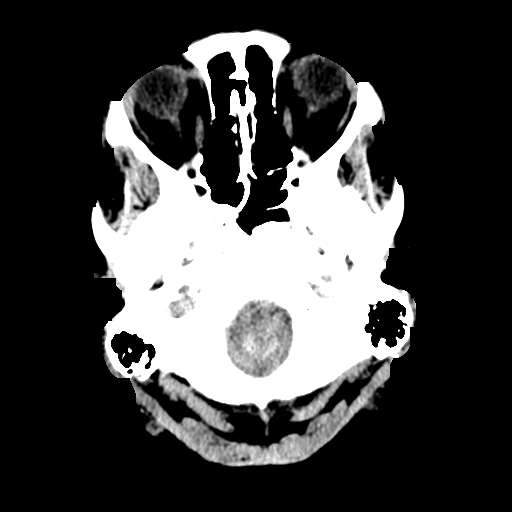
[im 4/29  bone]
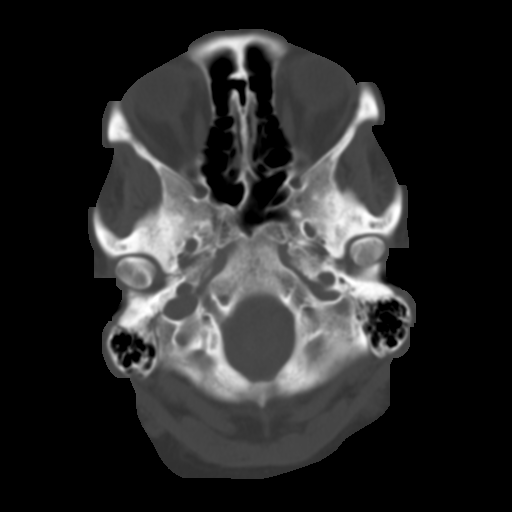
[im 8/29  brain]
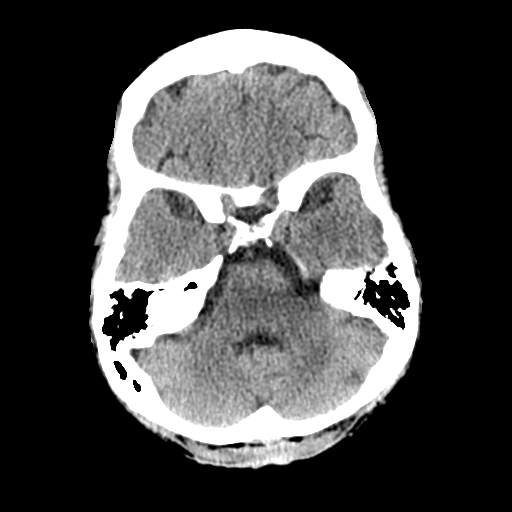
[im 11/29  brain]
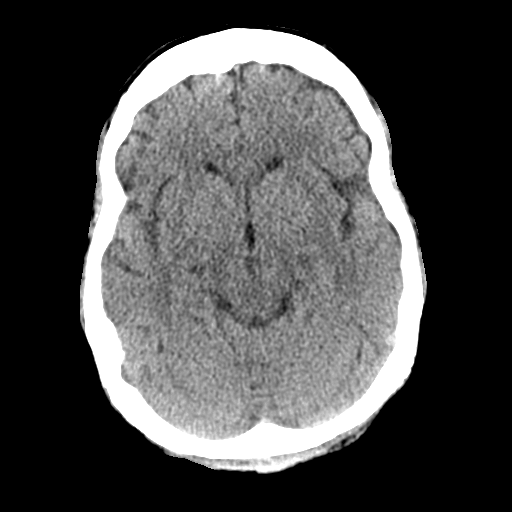
[im 15/29  brain]
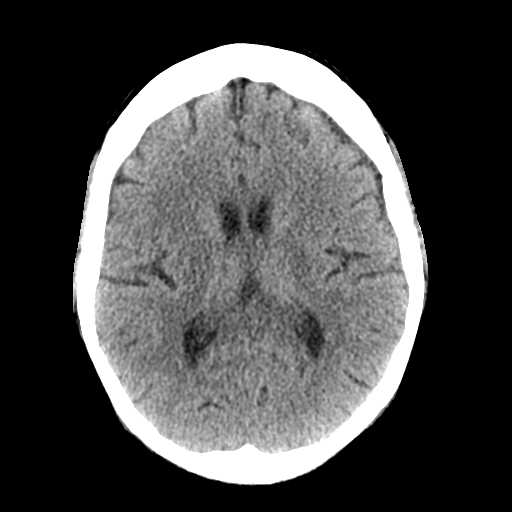
[im 18/29  brain]
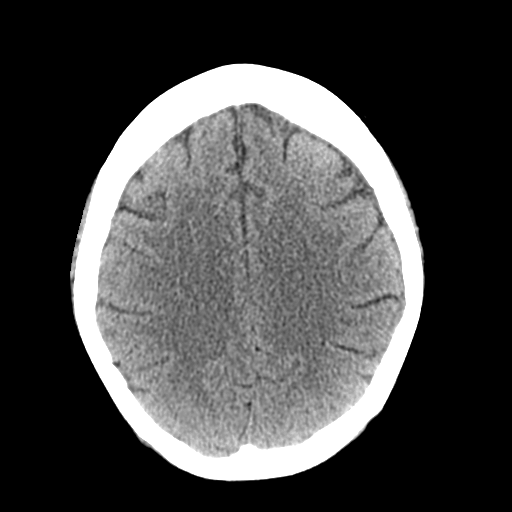
[im 18/29  bone]
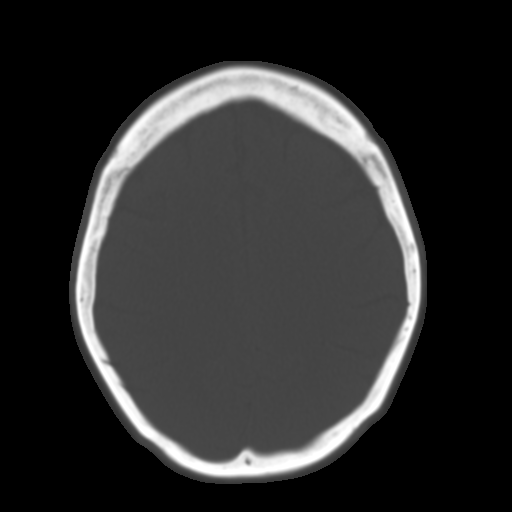
[im 22/29  brain]
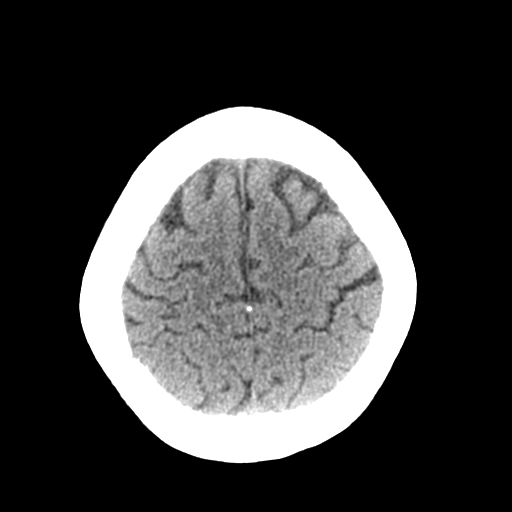
[im 25/29  brain]
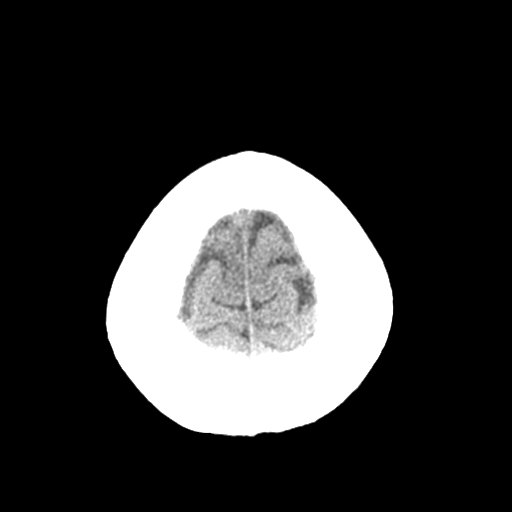

[Series 4: coronal soft tissue · coronal · 0.30mm/px · 3 of 63 slices shown]
[im 21/63  brain]
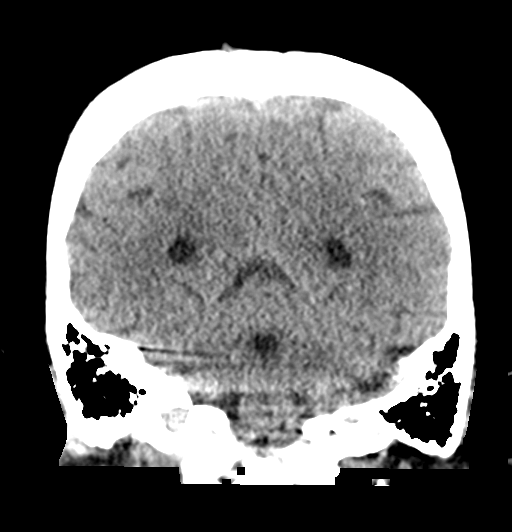
[im 28/63  brain]
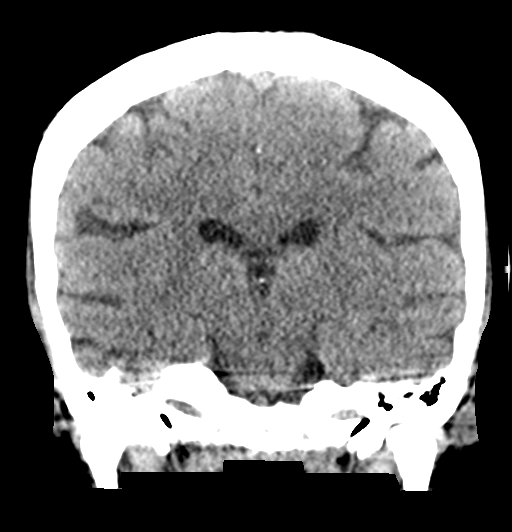
[im 35/63  brain]
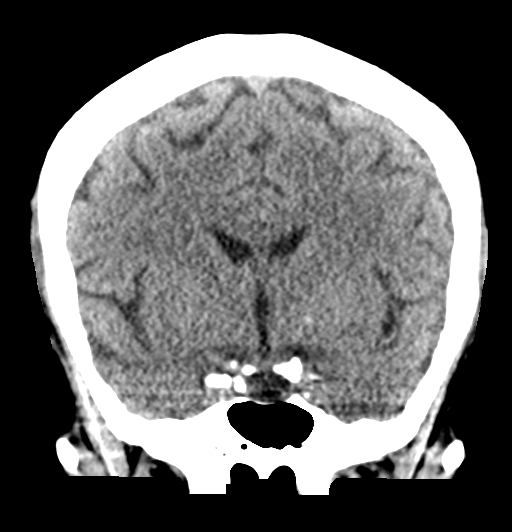

[Series 5: sagittal soft tissue · sagittal · 0.31mm/px · 3 of 50 slices shown]
[im 17/50  brain]
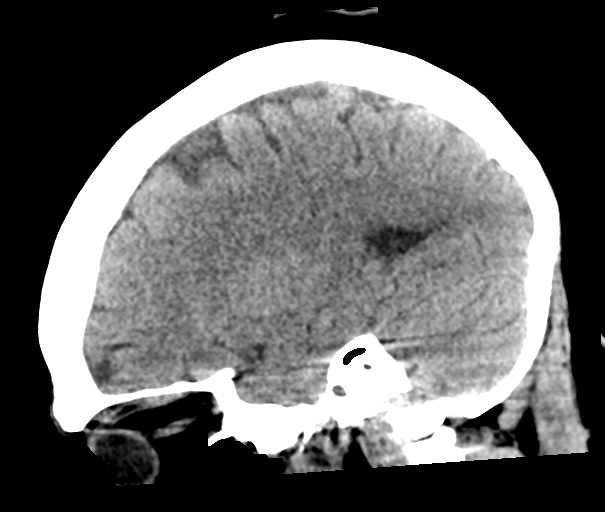
[im 25/50  brain]
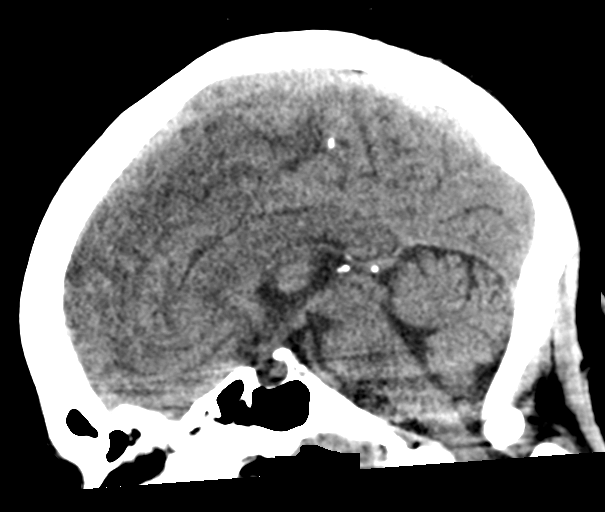
[im 33/50  brain]
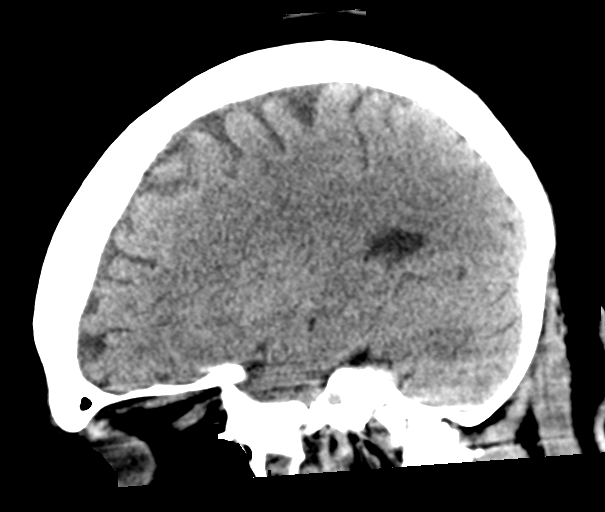

[17 of 47 positions shown; findings below may reference images not displayed]

FINDINGS: Brain: The brain shows a normal appearance without evidence of
malformation, atrophy, old or acute small or large vessel
infarction, mass lesion, hemorrhage, hydrocephalus or extra-axial
collection.

Vascular: No hyperdense vessel. No evidence of atherosclerotic
calcification.

Skull: Normal.  No traumatic finding.  No focal bone lesion.

Sinuses/Orbits: Sinuses are clear. Orbits appear normal. Mastoids
are clear.

Other: None significant
IMPRESSION: Normal head CT.

## 2020-11-19 MED ORDER — ONDANSETRON HCL 4 MG/2ML IJ SOLN
4.0000 mg | Freq: Four times a day (QID) | INTRAMUSCULAR | Status: DC | PRN
  Administered 2020-11-23: 08:00:00 4 mg via INTRAVENOUS
  Filled 2020-11-19: qty 2

## 2020-11-19 MED ORDER — LACTATED RINGERS IV SOLN: INTRAVENOUS | Status: DC

## 2020-11-19 MED ORDER — DEXAMETHASONE SODIUM PHOSPHATE 10 MG/ML IJ SOLN
10.0000 mg | Freq: Four times a day (QID) | INTRAMUSCULAR | Status: DC
  Administered 2020-11-19: 10 mg via INTRAVENOUS

## 2020-11-19 MED ORDER — DEXAMETHASONE SODIUM PHOSPHATE 10 MG/ML IJ SOLN: 10.0000 mg | Freq: Once | INTRAMUSCULAR | Status: AC

## 2020-11-19 MED ORDER — DILTIAZEM HCL ER COATED BEADS 120 MG PO CP24
120.0000 mg | ORAL_CAPSULE | Freq: Every day | ORAL | Status: DC
  Administered 2020-11-20 – 2020-12-18 (×28): 120 mg via ORAL
  Filled 2020-11-19 (×30): qty 1

## 2020-11-19 MED ORDER — ROSUVASTATIN CALCIUM 10 MG PO TABS
10.0000 mg | ORAL_TABLET | Freq: Every day | ORAL | Status: DC
  Administered 2020-11-20 – 2020-12-21 (×32): 10 mg via ORAL
  Filled 2020-11-19 (×33): qty 1

## 2020-11-19 MED ORDER — VANCOMYCIN HCL IN DEXTROSE 1-5 GM/200ML-% IV SOLN
1000.0000 mg | Freq: Once | INTRAVENOUS | Status: DC
  Filled 2020-11-19: qty 200

## 2020-11-19 MED ORDER — DEXTROSE 5 % IV SOLN
10.0000 mg/kg | Freq: Three times a day (TID) | INTRAVENOUS | Status: DC
  Administered 2020-11-20 (×2): 730 mg via INTRAVENOUS
  Filled 2020-11-19 (×4): qty 14.6

## 2020-11-19 MED ORDER — METRONIDAZOLE 500 MG/100ML IV SOLN
500.0000 mg | Freq: Once | INTRAVENOUS | Status: AC
Start: 2020-11-19 — End: 2020-11-20
  Administered 2020-11-20: 500 mg via INTRAVENOUS
  Filled 2020-11-19: qty 100

## 2020-11-19 MED ORDER — LORAZEPAM 2 MG/ML IJ SOLN: 1.0000 mg | Freq: Once | INTRAMUSCULAR | Status: AC

## 2020-11-19 MED ORDER — SODIUM CHLORIDE 0.9 % IV BOLUS
1000.0000 mL | Freq: Once | INTRAVENOUS | Status: AC
  Administered 2020-11-19: 1000 mL via INTRAVENOUS

## 2020-11-19 MED ORDER — ONDANSETRON HCL 4 MG PO TABS: 4.0000 mg | ORAL_TABLET | Freq: Four times a day (QID) | ORAL | Status: DC | PRN

## 2020-11-19 MED ORDER — LORAZEPAM 2 MG/ML IJ SOLN
1.0000 mg | Freq: Once | INTRAMUSCULAR | Status: AC
  Administered 2020-11-19: 1 mg via INTRAVENOUS
  Filled 2020-11-19: qty 1

## 2020-11-19 MED ORDER — DEXAMETHASONE SODIUM PHOSPHATE 10 MG/ML IJ SOLN
10.0000 mg | Freq: Four times a day (QID) | INTRAMUSCULAR | Status: DC
  Administered 2020-11-20 – 2020-11-21 (×5): 10 mg via INTRAVENOUS
  Filled 2020-11-19 (×5): qty 1

## 2020-11-19 MED ORDER — PANTOPRAZOLE SODIUM 40 MG PO TBEC
40.0000 mg | DELAYED_RELEASE_TABLET | Freq: Every day | ORAL | Status: DC
  Administered 2020-11-20 – 2020-12-22 (×33): 40 mg via ORAL
  Filled 2020-11-19 (×33): qty 1

## 2020-11-19 MED ORDER — ACETAMINOPHEN 650 MG RE SUPP
650.0000 mg | Freq: Once | RECTAL | Status: AC
  Administered 2020-11-19: 650 mg via RECTAL
  Filled 2020-11-19: qty 1

## 2020-11-19 MED ORDER — LIDOCAINE HCL (PF) 1 % IJ SOLN
5.0000 mL | Freq: Once | INTRAMUSCULAR | Status: AC
  Administered 2020-11-19: 5 mL
  Filled 2020-11-19: qty 5

## 2020-11-19 MED ORDER — DEXAMETHASONE SODIUM PHOSPHATE 10 MG/ML IJ SOLN
INTRAMUSCULAR | Status: AC
  Administered 2020-11-19: 10 mg via INTRAVENOUS
  Filled 2020-11-19: qty 1

## 2020-11-19 MED ORDER — ACETAMINOPHEN 650 MG RE SUPP: 650.0000 mg | Freq: Four times a day (QID) | RECTAL | Status: DC | PRN

## 2020-11-19 MED ORDER — ALBUTEROL SULFATE (2.5 MG/3ML) 0.083% IN NEBU: 3.0000 mL | INHALATION_SOLUTION | Freq: Four times a day (QID) | RESPIRATORY_TRACT | Status: DC | PRN

## 2020-11-19 MED ORDER — SODIUM CHLORIDE 0.9 % IV SOLN
2.0000 g | Freq: Two times a day (BID) | INTRAVENOUS | Status: DC
  Filled 2020-11-19: qty 20

## 2020-11-19 MED ORDER — ACETAMINOPHEN 500 MG PO TABS
1000.0000 mg | ORAL_TABLET | Freq: Once | ORAL | Status: DC
  Filled 2020-11-19: qty 2

## 2020-11-19 MED ORDER — ACETAMINOPHEN 325 MG PO TABS
650.0000 mg | ORAL_TABLET | Freq: Four times a day (QID) | ORAL | Status: DC | PRN
  Administered 2020-11-22 – 2020-12-18 (×20): 650 mg via ORAL
  Filled 2020-11-19 (×23): qty 2

## 2020-11-19 MED ORDER — VANCOMYCIN HCL 1750 MG/350ML IV SOLN
1750.0000 mg | Freq: Once | INTRAVENOUS | Status: AC
  Administered 2020-11-20: 1750 mg via INTRAVENOUS
  Filled 2020-11-19: qty 350

## 2020-11-19 MED ORDER — LORAZEPAM 2 MG/ML IJ SOLN
INTRAMUSCULAR | Status: AC
  Administered 2020-11-20: 1 mg via INTRAVENOUS
  Filled 2020-11-19: qty 1

## 2020-11-19 MED ORDER — LEVOTHYROXINE SODIUM 50 MCG PO TABS: 150.0000 ug | ORAL_TABLET | Freq: Every day | ORAL | Status: DC

## 2020-11-19 MED ORDER — SODIUM CHLORIDE 0.9 % IV SOLN
2.0000 g | Freq: Once | INTRAVENOUS | Status: DC
  Filled 2020-11-19: qty 2

## 2020-11-19 MED ORDER — SODIUM CHLORIDE 0.9 % IV SOLN
2.0000 g | Freq: Once | INTRAVENOUS | Status: AC
  Administered 2020-11-19: 2 g via INTRAVENOUS
  Filled 2020-11-19: qty 2

## 2020-11-19 NOTE — Consult Note (Signed)
PHARMACY -  BRIEF ANTIBIOTIC NOTE   Pharmacy has received consult(s) for Vancomycin/Aztreonam (with cefepime option) from an ED provider.  The patient's profile has been reviewed for ht/wt/allergies/indication/available labs.    One time order(s) placed for Cefepime 2g IV x 1 and Vancomycin 1750mg  IV x 1 (pt has has Cephalosporin as recently as March 2022 without documented allergy to cephalosporins)   Further antibiotics/pharmacy consults should be ordered by admitting physician if indicated.                       Thank you,  Lu Duffel, PharmD, BCPS Clinical Pharmacist 11/19/2020 10:18 PM

## 2020-11-19 NOTE — Consult Note (Signed)
CODE SEPSIS - PHARMACY COMMUNICATION  **Broad Spectrum Antibiotics should be administered within 1 hour of Sepsis diagnosis**  Time Code Sepsis Called/Page Received: 2221  Antibiotics Ordered: Cefepime/Vancomycin  Time of 1st antibiotic administration: 2259  Additional action taken by pharmacy: none  If necessary, Name of Provider/Nurse Contacted: [place], PharmD, BCPS Clinical Pharmacist 11/19/2020 11:05 PM

## 2020-11-19 NOTE — ED Triage Notes (Signed)
Patient brought in via ems from home. Lives at home with husband. States she has not had an appetite in a week. Woke up this morning with altered mental status. Denies any injury. Patient not wanting monitoring equipment on

## 2020-11-19 NOTE — H&P (Signed)
History and Physical   Tonya Myers WJX:[id number] DOB: April 02, 1966 DOA: 11/19/2020  PCP: [name], MD  Outpatient Specialists: Dr. Holley Raring Patient coming from: home via EMS  I have personally briefly reviewed patient's old medical records in [place].  Chief Concern: altered mental status   HPI: Tonya Myers is a 55 y.o. female with medical history significant for recent pneumococcal meningitis and bacteremia, depression, hypothyroid, IDDM2, presents to the ED for chief concern of altered mentation.   At bedside, she had expressive aphasia, right upper and lower extremity weakness. Confusion today per spouse. Can walk with a walker and assistance. Jumbled words. She has baseline weakness in the right arm compared to left arm.   Spouse reports that patient had poor PO intake for more than a week. Spouse endorses vomiting and dnies blood and black coffee ground substances.   Spouse endorses one episode of diarrhea, no blood and melena.   Social history: She lives at home with her husband.  Vaccination history: Per husband patient is vaccinated for COVID-19 with Pfizer including 1 booster  ROS: Unable to complete as patient is altered  ED Course: Discussed with emergency medicine provider, patient requiring hospitalization for suspicion of meningitis.  Vitals in the emergency department was remarkable for temperature of 102.6, respiration rate of 16, heart rate 67, blood pressure 152/84, SPO2 100% on room air.  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 team to consult infectious disease  # Hypokalemia-suspect secondary to vomiting - Replace with IV potassium, BMP in the a.m.  # Insulin-dependent diabetes mellitus-holding home metformin at this time - Insulin SSI with at bedtime coverage ordered - Patient's insulin is not on formulary, pharmacy consult placed  # Hyperlipidemia-rosuvastatin 10 mg nightly  # Hypothyroid-levothyroxine 150 mcg before breakfast ordered  # DVT prophylaxis-pharmacologic DVT this has not been ordered due to low platelets  A.m. team to complete med reconciliation  Chart reviewed.   DVT prophylaxis: TED hose, Code Status: Full code Diet: Heart healthy/carb modified, n.p.o. after midnight Family Communication: Called husband and discuss over the phone Disposition Plan: Pending clinical course Consults called: None at this time Admission status: Progressive cardiac, observation, telemetry ordered  Past Medical History:  Diagnosis Date   Acute hemorrhoid 03/11/2015   Anxiety    Arthritis    Asthma    Cancer ([place])    Chronic back pain    CLL (chronic lymphocytic leukemia) ([place])    Depression    Diabetes mellitus without complication ([place])    Genital herpes    type 2   Hypertension  Hypothyroidism    Vertigo    Past Surgical History:  Procedure Laterality Date   ABDOMINAL HYSTERECTOMY     BILATERAL SALPINGOOPHORECTOMY  2009   BREAST  BIOPSY Right 05/17/2016   FIBROADENOMATOUS CHANGE AND SCLEROSING ADENOSIS WITH   COLONOSCOPY WITH PROPOFOL N/A 06/16/2015   Procedure: COLONOSCOPY WITH PROPOFOL;  Surgeon: [name], MD;  Location: [facility] ENDOSCOPY;  Service: Endoscopy;  Laterality: N/A;   LAPAROSCOPIC SUPRACERVICAL HYSTERECTOMY  2009   due to [place] Bilateral 06/17/2020   Procedure: THORACIC LAMINECTOMY FOR EPIDURAL ABSCESS;  Surgeon: Deetta Perla, MD;  Location: [facility] ORS;  Service: Neurosurgery;  Laterality: Bilateral;   Social History:  reports that she has never smoked. She has never used smokeless tobacco. She reports current alcohol use of about 1.0 standard drink of alcohol per week. She reports that she does not use drugs.  Allergies  Allergen Reactions   Amoxapine    Penicillins    Family History  Problem Relation Age of Onset   Cancer Paternal Aunt    Breast cancer Maternal Aunt 17   Family history: Family history reviewed and not pertinent  Prior to Admission medications   Medication Sig Start Date End Date Taking? Authorizing Provider  acetaminophen (TYLENOL) 325 MG tablet Take 2 tablets (650 mg total) by mouth every 6 (six) hours as needed for mild pain (or Fever >/= 101). 07/30/20   Angiulli, Lavon Paganini, PA-C  albuterol (VENTOLIN HFA) 108 (90 Base) MCG/ACT inhaler Inhale into the lungs. 05/06/20 05/06/21  [provider]  aspirin EC 81 MG tablet Take 1 tablet (81 mg total) by mouth daily. Swallow whole. 10/02/20   Max Sane, MD  Continuous Blood Gluc Sensor (DEXCOM G6 SENSOR) MISC SMARTSIG:1 Each Topical Every 10 Days 08/12/20   [provider]  diclofenac Sodium (VOLTAREN) 1 % GEL Apply 2 g topically 4 (four) times daily. 07/30/20   Angiulli, Lavon Paganini, PA-C  diltiazem (DILACOR XR) 120 MG 24 hr capsule Take 1 capsule (120 mg total) by mouth daily. 07/30/20   Angiulli, Lavon Paganini, PA-C  DULoxetine (CYMBALTA) 30 MG capsule Take 1 capsule  (30 mg total) by mouth at bedtime. X 1 week- then 2 capsule/60 mg nightly- for nerve pain- 10/10/20   Lovorn, Jinny Blossom, MD  ezetimibe (ZETIA) 10 MG tablet Take 1 tablet (10 mg total) by mouth daily. 07/30/20   Angiulli, Lavon Paganini, PA-C  Insulin Glargine-Lixisenatide 100-33 UNT-MCG/ML SOPN Inject into the skin. 02/19/20   [provider]  levalbuterol Penne Lash) 1.25 MG/3ML nebulizer solution Take 1.25 mg by nebulization every 6 (six) hours as needed. 03/07/20   [provider]  levothyroxine (SYNTHROID) 150 MCG tablet Take 1 tablet (150 mcg total) by mouth daily before breakfast. 07/30/20   Angiulli, Lavon Paganini, PA-C  magnesium oxide (MAG-OX) 400 MG tablet Take by mouth. 07/30/20   [provider]  metFORMIN (GLUCOPHAGE-XR) 500 MG 24 hr tablet Take 1 tablet (500 mg total) by mouth 2 (two) times daily. 07/30/20   Angiulli, Lavon Paganini, PA-C  methocarbamol (ROBAXIN) 500 MG tablet Take 1 tablet (500 mg total) by mouth 3 (three) times daily. 07/30/20   Angiulli, Lavon Paganini, PA-C  montelukast (SINGULAIR) 10 MG tablet Take 1 tablet (10 mg total) by mouth at bedtime. 07/30/20   Angiulli, Lavon Paganini, PA-C  pantoprazole (PROTONIX) 40 MG tablet Take 1 tablet (40 mg total) by mouth daily. 07/30/20   Angiulli, Lavon Paganini, PA-C  pregabalin (  LYRICA) 100 MG capsule Take by mouth 2 (two) times daily. 09/05/20   [provider]  rosuvastatin (CRESTOR) 10 MG tablet Take 1 tablet (10 mg total) by mouth at bedtime. 07/30/20   Angiulli, Lavon Paganini, PA-C  traMADol (ULTRAM) 50 MG tablet Take 1 tablet (50 mg total) by mouth every 6 (six) hours as needed for moderate pain. 10/10/20   Lovorn, Jinny Blossom, MD  valACYclovir (VALTREX) 1000 MG tablet Take 1 tablet (1,000 mg total) by mouth 2 (two) times daily. 07/30/20   Angiulli, Lavon Paganini, PA-C  budesonide-formoterol (SYMBICORT) 160-4.5 MCG/ACT inhaler Inhale into the lungs. 07/14/18 03/15/19  [provider]   Physical Exam: Vitals:   11/19/20 2200 11/19/20 2230 11/19/20 2300  11/19/20 2330  BP: 133/68 (!) 152/87 131/83 138/76  Pulse: 78 79 83 82  Resp:      Temp:      TempSrc:      SpO2: 97% 99% 100% 100%   Constitutional: appears age-appropriate, NAD, calm, comfortable Eyes: PERRL, lids and conjunctivae normal ENMT: Mucous membranes are moist. Posterior pharynx clear of any exudate or lesions. Age-appropriate dentition. Hearing appropriate Neck: normal, supple, no masses, no thyromegaly Respiratory: clear to auscultation bilaterally, no wheezing, no crackles. Normal respiratory effort. No accessory muscle use.  Cardiovascular: Regular rate and rhythm, no murmurs / rubs / gallops. No extremity edema. 2+ pedal pulses. No carotid bruits.  Abdomen: no tenderness, no masses palpated, no hepatosplenomegaly. Bowel sounds positive.  Musculoskeletal: no clubbing / cyanosis. No joint deformity upper and lower extremities. Good ROM, no contractures, no atrophy. Normal muscle tone.  Skin: no rashes, lesions, ulcers. No induration Neurologic: Sensation intact. Strength 5/5 in all 4.  Psychiatric: Normal judgment and insight. Alert and oriented x 3. Normal mood.   EKG: Ordered  Chest x-ray on Admission: I personally reviewed and I agree with radiologist reading as below.  CT Head Wo Contrast  Result Date: 11/19/2020 CLINICAL DATA:  Mental status changes of unknown cause. EXAM: CT HEAD WITHOUT CONTRAST TECHNIQUE: Contiguous axial images were obtained from the base of the skull through the vertex without intravenous contrast. COMPARISON:  10/02/2020 FINDINGS: Brain: The brain shows a normal appearance without evidence of malformation, atrophy, old or acute small or large vessel infarction, mass lesion, hemorrhage, hydrocephalus or extra-axial collection. Vascular: No hyperdense vessel. No evidence of atherosclerotic calcification. Skull: Normal.  No traumatic finding.  No focal bone lesion. Sinuses/Orbits: Sinuses are clear. Orbits appear normal. Mastoids are clear. Other:  None significant IMPRESSION: Normal head CT. Electronically Signed   By: Nelson Chimes M.D.   On: 11/19/2020 16:19   DG Chest Portable 1 View  Result Date: 11/19/2020 CLINICAL DATA:  Cough, fever EXAM: PORTABLE CHEST 1 VIEW COMPARISON:  10/01/2020 FINDINGS: Heart size is upper limits of normal. Low lung volumes. Mildly increased interstitial markings bilaterally. No lobar consolidation. No pleural effusion or pneumothorax. IMPRESSION: Low lung volumes with mildly increased interstitial markings bilaterally may reflect mild edema or atypical/viral infection. Electronically Signed   By: Davina Poke D.O.   On: 11/19/2020 16:22    Labs on Admission: I have personally reviewed following labs  CBC: Recent Labs  Lab 11/19/20 1648  WBC 2.4*  HGB 9.2*  HCT 29.4*  MCV 83.1  PLT 72*   Basic Metabolic Panel: Recent Labs  Lab 11/19/20 1648  NA 141  K 2.8*  CL 105  CO2 25  GLUCOSE 157*  BUN 9  CREATININE 0.75  CALCIUM 8.3*   GFR: Estimated Creatinine Clearance:  90.6 mL/min (by C-G formula based on SCr of 0.75 mg/dL).  Liver Function Tests: Recent Labs  Lab 11/19/20 1648  AST 54*  ALT 5  ALKPHOS 68  BILITOT 1.3*  PROT 7.1  ALBUMIN 2.7*   Urine analysis:    Component Value Date/Time   COLORURINE AMBER (A) 11/19/2020 2133   APPEARANCEUR CLEAR (A) 11/19/2020 2133   LABSPEC 1.017 11/19/2020 2133   PHURINE 6.0 11/19/2020 2133   GLUCOSEU NEGATIVE 11/19/2020 2133   HGBUR MODERATE (A) 11/19/2020 2133   BILIRUBINUR NEGATIVE 11/19/2020 2133   KETONESUR 20 (A) 11/19/2020 2133   PROTEINUR >=300 (A) 11/19/2020 2133   NITRITE NEGATIVE 11/19/2020 2133   LEUKOCYTESUR NEGATIVE 11/19/2020 2133   Dr. Tobie Poet [facility]  If 7PM-7AM, please contact overnight-coverage provider If 7AM-7PM, please contact day coverage provider [email or web address]  11/20/2020, 1:03 AM

## 2020-11-19 NOTE — ED Notes (Signed)
Pt care taken, pt still feels confused, caregiver said she is not herself. Alert not oriented

## 2020-11-19 NOTE — ED Provider Notes (Signed)
[facility] Emergency Department Provider Note  Time seen: 3:39 PM  I have reviewed the triage vital signs and the nursing notes.   HISTORY  Chief Complaint Altered Mental Status   HPI Tonya Myers is a 55 y.o. female with a past medical history of anxiety, arthritis, CLL, diabetes, hypertension, history of meningitis in February now complicated by significant weakness, ambulates with a walker and assistance only is largely wheelchair-bound at home, presents to the emergency department for increased confusion and noted to have a fever.  According to the husband the home health nurse today noted that the patient appeared to be somewhat confused to release slower to respond and was noted to have a fever by EMS to 102.  Husband states EMS had mentioned given Tylenol but he is not sure if they gave her Tylenol or not prior to arrival.  Here the patient does have slow responses is alert she is oriented to person and place was unable to tell me the year, husband states normally she would be able to do so.   Past Medical History:  Diagnosis Date   Acute hemorrhoid 03/11/2015   Anxiety    Arthritis    Asthma    Cancer ([place])    Chronic back pain    CLL (chronic lymphocytic leukemia) ([place])    Depression    Diabetes mellitus without complication ([place])    Genital herpes    type 2   Hypertension    Hypothyroidism    Vertigo     Patient Active Problem List   Diagnosis Date Noted   Right arm weakness 10/21/2020   Quadriplegia ([place]) 10/10/2020   Encephalitis 10/10/2020   Wheelchair dependence 10/10/2020   Acute focal neurological deficit 10/02/2020   Functional paraparesis 10/02/2020   Left upper quadrant abdominal pain    Pain    Chronic pain of both knees    Hypokalemia    Urinary retention    Slow transit constipation    Meningitis    Labile blood glucose    Uncontrolled type 2 diabetes mellitus with hyperosmolar nonketotic hyperglycemia (HCC)     Transaminitis    Sleep disturbance    Anemia of chronic disease    Bacterial spinal epidural abscess 06/27/2020   Obesity (BMI 30.0-34.9)    Hypomagnesemia    Hypernatremia    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HCC)    Epidural abscess    Pneumococcal meningitis    Aphasia 06/10/2020   Hyperglycemic crisis in diabetes mellitus ([place]) 93/23/5573   Acute metabolic encephalopathy 22/05/5425   Severe sepsis (HCC)    Diabetic ketoacidosis without coma associated with type 2 diabetes mellitus ([place])    AKI (acute kidney injury) ([place])    Elevated LFTs    Acute encephalopathy 06/09/2020   Bacterial vaginosis 11/01/2016   Chronic right shoulder pain 03/23/2016   Numbness and tingling 03/23/2016   Uncontrolled type 2 diabetes mellitus with hyperglycemia, without long-term current use of insulin ([place]) 10/27/2015   DDD (degenerative disc disease), cervical 07/29/2015   Cervical disc disorder with radiculopathy of cervical region 07/29/2015   Cervical facet syndrome 07/29/2015   DDD (degenerative disc disease), lumbar 07/29/2015   Facet syndrome, lumbar 07/29/2015   Bilateral occipital neuralgia 07/29/2015   Sacroiliac joint dysfunction 07/29/2015   DJD of shoulder 07/29/2015   Herpes simplex vulvovaginitis 05/29/2015   Acute hemorrhoid 03/11/2015   CLL (chronic lymphocytic leukemia) ([place]) 01/15/2015   Hypothyroidism 12/26/2014   Arthritis 12/26/2014   Carpal tunnel syndrome, bilateral 12/26/2014  Controlled type 2 diabetes mellitus without complication, without long-term current use of insulin ([place]) 12/26/2014   Essential hypertension 12/26/2014   Hyperlipidemia, mixed 12/26/2014    Past Surgical History:  Procedure Laterality Date   ABDOMINAL HYSTERECTOMY     BILATERAL SALPINGOOPHORECTOMY  2009   BREAST BIOPSY Right 05/17/2016   FIBROADENOMATOUS CHANGE AND SCLEROSING ADENOSIS WITH   COLONOSCOPY WITH PROPOFOL N/A 06/16/2015   Procedure: COLONOSCOPY WITH PROPOFOL;  Surgeon: [name], MD;   Location: [facility] ENDOSCOPY;  Service: Endoscopy;  Laterality: N/A;   LAPAROSCOPIC SUPRACERVICAL HYSTERECTOMY  2009   due to [place] Bilateral 06/17/2020   Procedure: THORACIC LAMINECTOMY FOR EPIDURAL ABSCESS;  Surgeon: Deetta Perla, MD;  Location: [facility] ORS;  Service: Neurosurgery;  Laterality: Bilateral;    Prior to Admission medications   Medication Sig Start Date End Date Taking? Authorizing Provider  acetaminophen (TYLENOL) 325 MG tablet Take 2 tablets (650 mg total) by mouth every 6 (six) hours as needed for mild pain (or Fever >/= 101). 07/30/20   Angiulli, Lavon Paganini, PA-C  albuterol (VENTOLIN HFA) 108 (90 Base) MCG/ACT inhaler Inhale into the lungs. 05/06/20 05/06/21  [provider]  aspirin EC 81 MG tablet Take 1 tablet (81 mg total) by mouth daily. Swallow whole. 10/02/20   Max Sane, MD  Continuous Blood Gluc Sensor (DEXCOM G6 SENSOR) MISC SMARTSIG:1 Each Topical Every 10 Days 08/12/20   [provider]  diclofenac Sodium (VOLTAREN) 1 % GEL Apply 2 g topically 4 (four) times daily. 07/30/20   Angiulli, Lavon Paganini, PA-C  diltiazem (DILACOR XR) 120 MG 24 hr capsule Take 1 capsule (120 mg total) by mouth daily. 07/30/20   Angiulli, Lavon Paganini, PA-C  DULoxetine (CYMBALTA) 30 MG capsule Take 1 capsule (30 mg total) by mouth at bedtime. X 1 week- then 2 capsule/60 mg nightly- for nerve pain- 10/10/20   Lovorn, Jinny Blossom, MD  ezetimibe (ZETIA) 10 MG tablet Take 1 tablet (10 mg total) by mouth daily. 07/30/20   Angiulli, Lavon Paganini, PA-C  Insulin Glargine-Lixisenatide 100-33 UNT-MCG/ML SOPN Inject into the skin. 02/19/20   [provider]  levalbuterol Penne Lash) 1.25 MG/3ML nebulizer solution Take 1.25 mg by nebulization every 6 (six) hours as needed. 03/07/20   [provider]  levothyroxine (SYNTHROID) 150 MCG tablet Take 1 tablet (150 mcg total) by mouth daily before breakfast. 07/30/20   Angiulli, Lavon Paganini, PA-C  magnesium  oxide (MAG-OX) 400 MG tablet Take by mouth. 07/30/20   [provider]  metFORMIN (GLUCOPHAGE-XR) 500 MG 24 hr tablet Take 1 tablet (500 mg total) by mouth 2 (two) times daily. 07/30/20   Angiulli, Lavon Paganini, PA-C  methocarbamol (ROBAXIN) 500 MG tablet Take 1 tablet (500 mg total) by mouth 3 (three) times daily. 07/30/20   Angiulli, Lavon Paganini, PA-C  montelukast (SINGULAIR) 10 MG tablet Take 1 tablet (10 mg total) by mouth at bedtime. 07/30/20   Angiulli, Lavon Paganini, PA-C  pantoprazole (PROTONIX) 40 MG tablet Take 1 tablet (40 mg total) by mouth daily. 07/30/20   Angiulli, Lavon Paganini, PA-C  pregabalin (LYRICA) 100 MG capsule Take by mouth 2 (two) times daily. 09/05/20   [provider]  rosuvastatin (CRESTOR) 10 MG tablet Take 1 tablet (10 mg total) by mouth at bedtime. 07/30/20   Angiulli, Lavon Paganini, PA-C  traMADol (ULTRAM) 50 MG tablet Take 1 tablet (50 mg total) by mouth every 6 (six) hours as needed for moderate pain.  10/10/20   Lovorn, Jinny Blossom, MD  valACYclovir (VALTREX) 1000 MG tablet Take 1 tablet (1,000 mg total) by mouth 2 (two) times daily. 07/30/20   Angiulli, Lavon Paganini, PA-C  budesonide-formoterol (SYMBICORT) 160-4.5 MCG/ACT inhaler Inhale into the lungs. 07/14/18 03/15/19  [provider]    Allergies  Allergen Reactions   Amoxapine    Penicillins     Family History  Problem Relation Age of Onset   Cancer Paternal Aunt    Breast cancer Maternal Aunt 70    Social History Social History   Tobacco Use   Smoking status: Never   Smokeless tobacco: Never  Vaping Use   Vaping Use: Never used  Substance Use Topics   Alcohol use: Yes    Alcohol/week: 1.0 standard drink    Types: 1 Cans of beer per week   Drug use: No    Review of Systems largely per husband Constitutional: EMS reported fever to 102.  Decreased appetite x2 weeks. Cardiovascular: Negative for chest pain. Respiratory: Negative for shortness of breath.  Occasional cough. Gastrointestinal: Negative for  abdominal pain, vomiting.  Positive for diarrhea. Genitourinary: Negative for urinary compaints Musculoskeletal: Negative for musculoskeletal complaints Neurological: Negative for headache All other ROS negative  ____________________________________________   PHYSICAL EXAM:  VITAL SIGNS: ED Triage Vitals  Enc Vitals Group     BP 11/19/20 1451 (!) 152/84     Pulse Rate 11/19/20 1451 67     Resp 11/19/20 1451 16     Temp 11/19/20 1451 97.8 F (36.6 C)     Temp Source 11/19/20 1451 Oral     SpO2 11/19/20 1451 97 %     Weight --      Height --      Head Circumference --      Peak Flow --      Pain Score 11/19/20 1453 0     Pain Loc --      Pain Edu? --      Excl. in [place]? --    Constitutional: Awake alert oriented to person and place but not time.  Calm, no distress. Eyes: Normal exam ENT      Head: Normocephalic and atraumatic.      Mouth/Throat: Mucous membranes are moist. Cardiovascular: Normal rate, regular rhythm. Respiratory: Normal respiratory effort without tachypnea nor retractions. Breath sounds are clear Gastrointestinal: Soft and nontender. No distention.  Musculoskeletal: Nontender with normal range of motion in all extremities.  Neurologic:  Normal speech and language. No gross focal neurologic deficits  Skin:  Skin is warm, dry and intact.  Psychiatric: Mood and affect are normal.     RADIOLOGY  CT scan head shows no acute abnormality. Chest x-ray shows no significant abnormality possible edema versus atypical infection.  ____________________________________________   INITIAL IMPRESSION / ASSESSMENT AND PLAN / ED COURSE  Pertinent labs & imaging results that were available during my care of the patient were reviewed by me and considered in my medical decision making (see chart for details).   Patient presents emergency department for weakness confusion found to be febrile per EMS 102 possibly received Tylenol prior to arrival this is not clear.  Given  the patient's reported fever we will check labs, cultures, urinalysis, COVID swab, chest x-ray.  Given the reported increased confusion and slower responses we will obtain a CT scan of the head as a precaution.  We will IV hydrate while awaiting results.  Patient and husband agreeable to plan of care.  Patient's work-up shows leukopenia,  otherwise no significant findings.  COVID test is negative.  Chest x-ray does not show any definitive pneumonia, urinalysis negative.  Patient now has a fever to 102.6.  I have ordered broad-spectrum antibiotics for the patient.  Patient has a history of meningitis in February given the patient's confusion and fever meningitis remains a possibility.  I discussed with the patient and husband a lumbar puncture.  Patient has had multiple back surgeries in the past however we will attempt lumbar puncture in the emergency department as to not delay care.  After 2 times in the emergency department was unsuccessful in obtaining spinal fluid.  We will continue the broad-spectrum antibiotics regardless and admit to the hospitalist service will need interventional radiology for CT-guided LP tomorrow morning.  Husband and patient agreeable to plan of care.  LUMBAR PUNCTURE  Date/Time: 11/19/2020 at 11:37 PM Performed by: Harvest Dark  Consent: Verbal consent obtained. Written consent obtained. Risks and benefits: risks, benefits and alternatives were discussed Consent given by: Patient and husband Patient understanding: patient states understanding of the procedure being performed  Patient consent: the patient's understanding of the procedure matches consent given  Procedure consent: procedure consent matches procedure scheduled  Relevant documents: relevant documents present and verified  Test results: test results available and properly labeled Site marked: the operative site was marked Imaging studies: imaging studies available  Required items: required blood  products, implants, devices, and special equipment available  Patient identity confirmed: verbally with patient and arm band  Time out: Immediately prior to procedure a "time out" was called to verify the correct patient, procedure, equipment, support staff and site/side marked as required.  Indications: Rule out meningitis Anesthesia: local infiltration Local anesthetic: lidocaine 1% without epinephrine Anesthetic total: 5 ml Patient sedated: 1 mg Ativan Analgesia: None Preparation: Patient was prepped and draped in the usual sterile fashion. Lumbar space: L3-L4 interspace Patient's position: Right lateral decubitus Needle gauge: 22 Needle length: 3.5 in Number of attempts: 2 Unsuccessful Post-procedure: site cleaned and adhesive bandage applied Patient tolerance: Patient tolerated the procedure well with no immediate complications   Tonya Myers was evaluated in Emergency Department on 11/19/2020 for the symptoms described in the history of present illness. She was evaluated in the context of the global COVID-19 pandemic, which necessitated consideration that the patient might be at risk for infection with the SARS-CoV-2 virus that causes COVID-19. Institutional protocols and algorithms that pertain to the evaluation of patients at risk for COVID-19 are in a state of rapid change based on information released by regulatory bodies including the CDC and federal and state organizations. These policies and algorithms were followed during the patient's care in the ED.  CRITICAL CARE Performed by: Harvest Dark   Total critical care time: 30 minutes  Critical care time was exclusive of separately billable procedures and treating other patients.  Critical care was necessary to treat or prevent imminent or life-threatening deterioration.  Critical care was time spent personally by me on the following activities: development of treatment plan with patient and/or surrogate as well as  nursing, discussions with consultants, evaluation of patient's response to treatment, examination of patient, obtaining history from patient or surrogate, ordering and performing treatments and interventions, ordering and review of laboratory studies, ordering and review of radiographic studies, pulse oximetry and re-evaluation of patient's condition.  ____________________________________________   FINAL CLINICAL IMPRESSION(S) / ED DIAGNOSES  Confusion Fever Sepsis   Harvest Dark, MD 11/19/20 2338

## 2020-11-19 NOTE — ED Notes (Signed)
Unable to obtain IV access at this time.

## 2020-11-19 NOTE — ED Notes (Signed)
Assisted with a lumbar puncture, tolerated well,

## 2020-11-20 ENCOUNTER — Observation Stay: Payer: Medicare Other

## 2020-11-20 DIAGNOSIS — G9340 Encephalopathy, unspecified: Secondary | ICD-10-CM | POA: Diagnosis present

## 2020-11-20 DIAGNOSIS — N051 Unspecified nephritic syndrome with focal and segmental glomerular lesions: Secondary | ICD-10-CM | POA: Diagnosis present

## 2020-11-20 DIAGNOSIS — Z794 Long term (current) use of insulin: Secondary | ICD-10-CM | POA: Diagnosis not present

## 2020-11-20 DIAGNOSIS — G0490 Encephalitis and encephalomyelitis, unspecified: Secondary | ICD-10-CM | POA: Diagnosis not present

## 2020-11-20 DIAGNOSIS — G928 Other toxic encephalopathy: Secondary | ICD-10-CM | POA: Diagnosis present

## 2020-11-20 DIAGNOSIS — N186 End stage renal disease: Secondary | ICD-10-CM | POA: Diagnosis not present

## 2020-11-20 DIAGNOSIS — B370 Candidal stomatitis: Secondary | ICD-10-CM | POA: Diagnosis present

## 2020-11-20 DIAGNOSIS — I776 Arteritis, unspecified: Secondary | ICD-10-CM | POA: Diagnosis not present

## 2020-11-20 DIAGNOSIS — D638 Anemia in other chronic diseases classified elsewhere: Secondary | ICD-10-CM | POA: Diagnosis present

## 2020-11-20 DIAGNOSIS — G030 Nonpyogenic meningitis: Secondary | ICD-10-CM

## 2020-11-20 DIAGNOSIS — R404 Transient alteration of awareness: Secondary | ICD-10-CM | POA: Diagnosis not present

## 2020-11-20 DIAGNOSIS — I10 Essential (primary) hypertension: Secondary | ICD-10-CM | POA: Diagnosis present

## 2020-11-20 DIAGNOSIS — M317 Microscopic polyangiitis: Secondary | ICD-10-CM | POA: Diagnosis present

## 2020-11-20 DIAGNOSIS — D61818 Other pancytopenia: Secondary | ICD-10-CM | POA: Diagnosis present

## 2020-11-20 DIAGNOSIS — C9111 Chronic lymphocytic leukemia of B-cell type in remission: Secondary | ICD-10-CM | POA: Diagnosis present

## 2020-11-20 DIAGNOSIS — I7789 Other specified disorders of arteries and arterioles: Secondary | ICD-10-CM | POA: Diagnosis present

## 2020-11-20 DIAGNOSIS — Y92239 Unspecified place in hospital as the place of occurrence of the external cause: Secondary | ICD-10-CM | POA: Diagnosis not present

## 2020-11-20 DIAGNOSIS — M3131 Wegener's granulomatosis with renal involvement: Secondary | ICD-10-CM | POA: Diagnosis present

## 2020-11-20 DIAGNOSIS — K858 Other acute pancreatitis without necrosis or infection: Secondary | ICD-10-CM | POA: Diagnosis not present

## 2020-11-20 DIAGNOSIS — E1165 Type 2 diabetes mellitus with hyperglycemia: Secondary | ICD-10-CM | POA: Diagnosis not present

## 2020-11-20 DIAGNOSIS — K859 Acute pancreatitis without necrosis or infection, unspecified: Secondary | ICD-10-CM | POA: Diagnosis present

## 2020-11-20 DIAGNOSIS — Z993 Dependence on wheelchair: Secondary | ICD-10-CM | POA: Diagnosis not present

## 2020-11-20 DIAGNOSIS — D8989 Other specified disorders involving the immune mechanism, not elsewhere classified: Secondary | ICD-10-CM | POA: Diagnosis not present

## 2020-11-20 DIAGNOSIS — M329 Systemic lupus erythematosus, unspecified: Secondary | ICD-10-CM | POA: Diagnosis not present

## 2020-11-20 DIAGNOSIS — E876 Hypokalemia: Secondary | ICD-10-CM | POA: Diagnosis present

## 2020-11-20 DIAGNOSIS — R410 Disorientation, unspecified: Secondary | ICD-10-CM | POA: Diagnosis not present

## 2020-11-20 DIAGNOSIS — E11649 Type 2 diabetes mellitus with hypoglycemia without coma: Secondary | ICD-10-CM | POA: Diagnosis not present

## 2020-11-20 DIAGNOSIS — E039 Hypothyroidism, unspecified: Secondary | ICD-10-CM | POA: Diagnosis present

## 2020-11-20 DIAGNOSIS — N08 Glomerular disorders in diseases classified elsewhere: Secondary | ICD-10-CM | POA: Diagnosis not present

## 2020-11-20 DIAGNOSIS — R4182 Altered mental status, unspecified: Secondary | ICD-10-CM | POA: Diagnosis present

## 2020-11-20 DIAGNOSIS — R571 Hypovolemic shock: Secondary | ICD-10-CM | POA: Diagnosis not present

## 2020-11-20 DIAGNOSIS — E871 Hypo-osmolality and hyponatremia: Secondary | ICD-10-CM | POA: Diagnosis not present

## 2020-11-20 DIAGNOSIS — E872 Acidosis: Secondary | ICD-10-CM | POA: Diagnosis not present

## 2020-11-20 DIAGNOSIS — A419 Sepsis, unspecified organism: Secondary | ICD-10-CM | POA: Diagnosis not present

## 2020-11-20 DIAGNOSIS — C9110 Chronic lymphocytic leukemia of B-cell type not having achieved remission: Secondary | ICD-10-CM | POA: Diagnosis not present

## 2020-11-20 DIAGNOSIS — R509 Fever, unspecified: Secondary | ICD-10-CM | POA: Diagnosis not present

## 2020-11-20 DIAGNOSIS — F32A Depression, unspecified: Secondary | ICD-10-CM | POA: Diagnosis present

## 2020-11-20 DIAGNOSIS — E1151 Type 2 diabetes mellitus with diabetic peripheral angiopathy without gangrene: Secondary | ICD-10-CM | POA: Diagnosis present

## 2020-11-20 DIAGNOSIS — I959 Hypotension, unspecified: Secondary | ICD-10-CM | POA: Diagnosis not present

## 2020-11-20 DIAGNOSIS — Z20822 Contact with and (suspected) exposure to covid-19: Secondary | ICD-10-CM | POA: Diagnosis present

## 2020-11-20 DIAGNOSIS — N179 Acute kidney failure, unspecified: Secondary | ICD-10-CM | POA: Diagnosis not present

## 2020-11-20 DIAGNOSIS — R4701 Aphasia: Secondary | ICD-10-CM | POA: Diagnosis present

## 2020-11-20 LAB — COMPREHENSIVE METABOLIC PANEL
ALT: <5 U/L (ref 0–44)
AST: 42 U/L — ABNORMAL HIGH (ref 15–41)
Albumin: 2.3 g/dL — ABNORMAL LOW (ref 3.5–5.0)
Alkaline Phosphatase: 59 U/L (ref 38–126)
Anion gap: 12 (ref 5–15)
BUN: 11 mg/dL (ref 6–20)
CO2: 23 mmol/L (ref 22–32)
Calcium: 7.6 mg/dL — ABNORMAL LOW (ref 8.9–10.3)
Chloride: 104 mmol/L (ref 98–111)
Creatinine, Ser: 0.59 mg/dL (ref 0.44–1.00)
GFR, Estimated: >60 mL/min (ref >60)
Glucose, Bld: 266 mg/dL — ABNORMAL HIGH (ref 70–99)
Potassium: 3.4 mmol/L — ABNORMAL LOW (ref 3.5–5.1)
Sodium: 139 mmol/L (ref 135–145)
Total Bilirubin: 0.9 mg/dL (ref 0.3–1.2)
Total Protein: 6.4 g/dL — ABNORMAL LOW (ref 6.5–8.1)

## 2020-11-20 LAB — GLUCOSE, CSF: Glucose, CSF: 110 mg/dL — ABNORMAL HIGH (ref 40–70)

## 2020-11-20 LAB — CORTISOL-AM, BLOOD: Cortisol - AM: 9.4 ug/dL (ref 6.7–22.6)

## 2020-11-20 LAB — CBC WITH DIFFERENTIAL/PLATELET
Abs Immature Granulocytes: 0.01 10*3/uL (ref 0.00–0.07)
Basophils Absolute: 0 10*3/uL (ref 0.0–0.1)
Basophils Relative: 0 %
Eosinophils Absolute: 0 10*3/uL (ref 0.0–0.5)
Eosinophils Relative: 0 %
HCT: 27.9 % — ABNORMAL LOW (ref 36.0–46.0)
Hemoglobin: 8.8 g/dL — ABNORMAL LOW (ref 12.0–15.0)
Immature Granulocytes: 0 %
Lymphocytes Relative: 33 %
Lymphs Abs: 0.8 10*3/uL (ref 0.7–4.0)
MCH: 26.7 pg (ref 26.0–34.0)
MCHC: 31.5 g/dL (ref 30.0–36.0)
MCV: 84.5 fL (ref 80.0–100.0)
Monocytes Absolute: 0.1 10*3/uL (ref 0.1–1.0)
Monocytes Relative: 4 %
Neutro Abs: 1.4 10*3/uL — ABNORMAL LOW (ref 1.7–7.7)
Neutrophils Relative %: 63 %
Platelets: 63 10*3/uL — ABNORMAL LOW (ref 150–400)
RBC: 3.3 MIL/uL — ABNORMAL LOW (ref 3.87–5.11)
RDW: 16.5 % — ABNORMAL HIGH (ref 11.5–15.5)
WBC: 2.3 10*3/uL — ABNORMAL LOW (ref 4.0–10.5)
nRBC: 0 % (ref 0.0–0.2)

## 2020-11-20 LAB — CBG MONITORING, ED
Glucose-Capillary: 228 mg/dL — ABNORMAL HIGH (ref 70–99)
Glucose-Capillary: 230 mg/dL — ABNORMAL HIGH (ref 70–99)
Glucose-Capillary: 319 mg/dL — ABNORMAL HIGH (ref 70–99)

## 2020-11-20 LAB — CSF CELL COUNT WITH DIFFERENTIAL
Eosinophils, CSF: 0 %
Lymphs, CSF: 93 %
Monocyte-Macrophage-Spinal Fluid: 7 %
RBC Count, CSF: 0 /mm3 (ref 0–3)
Segmented Neutrophils-CSF: 0 %
Tube #: 3
WBC, CSF: 5 /mm3 (ref 0–5)

## 2020-11-20 LAB — PROCALCITONIN: Procalcitonin: 0.41 ng/mL

## 2020-11-20 LAB — MAGNESIUM: Magnesium: 1.6 mg/dL — ABNORMAL LOW (ref 1.7–2.4)

## 2020-11-20 LAB — PROTEIN, CSF: Total  Protein, CSF: 158 mg/dL — ABNORMAL HIGH (ref 15–45)

## 2020-11-20 LAB — TSH: TSH: 0.12 u[IU]/mL — ABNORMAL LOW (ref 0.350–4.500)

## 2020-11-20 LAB — PROTIME-INR
INR: 1 (ref 0.8–1.2)
Prothrombin Time: 13.6 seconds (ref 11.4–15.2)

## 2020-11-20 LAB — GLUCOSE, CAPILLARY: Glucose-Capillary: 326 mg/dL — ABNORMAL HIGH (ref 70–99)

## 2020-11-20 LAB — CRYPTOCOCCAL ANTIGEN, CSF: Crypto Ag: NEGATIVE

## 2020-11-20 LAB — AMMONIA: Ammonia: 17 umol/L (ref 9–35)

## 2020-11-20 LAB — HIV ANTIBODY (ROUTINE TESTING W REFLEX): HIV Screen 4th Generation wRfx: NONREACTIVE

## 2020-11-20 IMAGING — RF DG SPINAL PUNCT LUMBAR DIAG WITH FL CT GUIDANCE
1 series · 1 of 1 positions shown · non-contrast
Comparison: None

CLINICAL DATA: Altered mental status

EXAM:
DIAGNOSTIC LUMBAR PUNCTURE UNDER FLUOROSCOPIC GUIDANCE

[Series 1: cp_standard · 0.18mm/px · 1 of 1 slices shown]
[im 1/1]
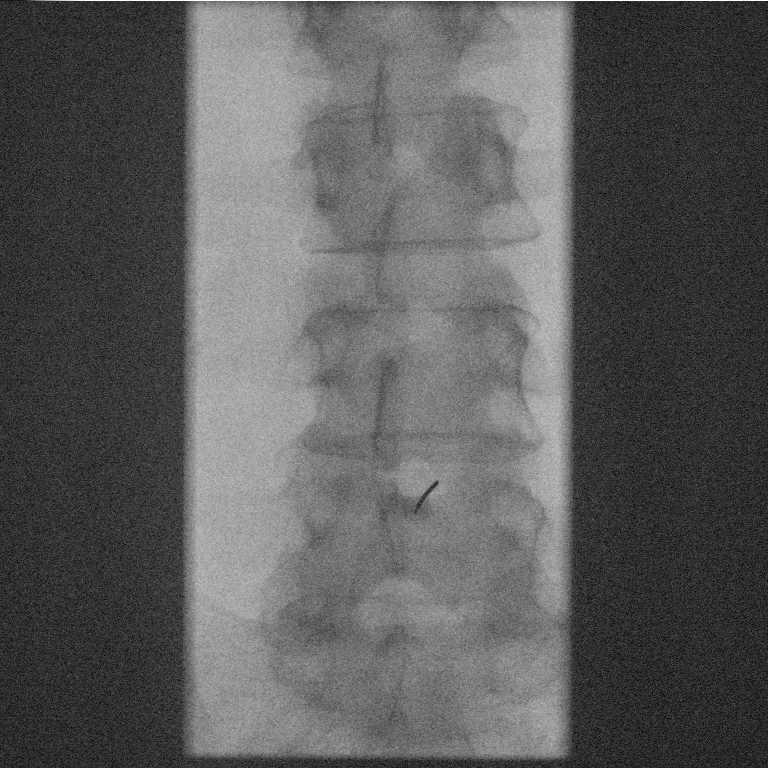

[1 of 1 positions shown; findings below may reference images not displayed]

FLUOROSCOPY TIME:  Fluoroscopy Time:  12 seconds

Radiation Exposure Index (if provided by the fluoroscopic device):
1.6 mGy

PROCEDURE:
Informed consent was obtained from the patient's husband prior to
the procedure, including potential complications of headache,
allergy, and pain. With the patient prone, the lower back was
prepped with Betadine. 1% Lidocaine was used for local anesthesia.
Lumbar puncture was performed at the L4-L5 level using a 20 gauge
needle with return of clear CSF. 8 ml of CSF were obtained for
laboratory studies. The patient tolerated the procedure well and
there were no apparent complications.
IMPRESSION: Technically successful fluoroscopic guided lumbar puncture.

## 2020-11-20 MED ORDER — LACTATED RINGERS IV SOLN: INTRAVENOUS | Status: DC

## 2020-11-20 MED ORDER — INSULIN ASPART 100 UNIT/ML IJ SOLN
0.0000 [IU] | Freq: Every day | INTRAMUSCULAR | Status: DC
Start: 2020-11-20 — End: 2020-11-20

## 2020-11-20 MED ORDER — LORAZEPAM 2 MG/ML IJ SOLN: 1.0000 mg | INTRAMUSCULAR | Status: DC | PRN

## 2020-11-20 MED ORDER — INSULIN GLARGINE-YFGN 100 UNIT/ML ~~LOC~~ SOLN
20.0000 [IU] | Freq: Every day | SUBCUTANEOUS | Status: DC
  Administered 2020-11-20: 20 [IU] via SUBCUTANEOUS
  Filled 2020-11-20 (×2): qty 0.2

## 2020-11-20 MED ORDER — SODIUM CHLORIDE 0.9 % IV SOLN
2.0000 g | Freq: Two times a day (BID) | INTRAVENOUS | Status: DC
  Administered 2020-11-20 – 2020-11-21 (×4): 2 g via INTRAVENOUS
  Filled 2020-11-20: qty 2
  Filled 2020-11-20: qty 20
  Filled 2020-11-20: qty 2
  Filled 2020-11-20 (×2): qty 20
  Filled 2020-11-20: qty 2

## 2020-11-20 MED ORDER — INSULIN ASPART 100 UNIT/ML IJ SOLN
0.0000 [IU] | INTRAMUSCULAR | Status: DC
Start: 2020-11-20 — End: 2020-11-28
  Administered 2020-11-20 (×2): 11 [IU] via SUBCUTANEOUS
  Administered 2020-11-21: 17:00:00 15 [IU] via SUBCUTANEOUS
  Administered 2020-11-21: 8 [IU] via SUBCUTANEOUS
  Administered 2020-11-21: 5 [IU] via SUBCUTANEOUS
  Administered 2020-11-21 (×2): 8 [IU] via SUBCUTANEOUS
  Administered 2020-11-21 – 2020-11-22 (×2): 5 [IU] via SUBCUTANEOUS
  Administered 2020-11-22: 3 [IU] via SUBCUTANEOUS
  Administered 2020-11-22 (×2): 8 [IU] via SUBCUTANEOUS
  Administered 2020-11-22 (×2): 5 [IU] via SUBCUTANEOUS
  Administered 2020-11-22 – 2020-11-23 (×2): 3 [IU] via SUBCUTANEOUS
  Administered 2020-11-23: 12:00:00 5 [IU] via SUBCUTANEOUS
  Administered 2020-11-23 (×2): 3 [IU] via SUBCUTANEOUS
  Administered 2020-11-23: 5 [IU] via SUBCUTANEOUS
  Administered 2020-11-24 – 2020-11-25 (×3): 2 [IU] via SUBCUTANEOUS
  Administered 2020-11-26: 3 [IU] via SUBCUTANEOUS
  Administered 2020-11-27 (×2): 2 [IU] via SUBCUTANEOUS
  Administered 2020-11-28: 11 [IU] via SUBCUTANEOUS
  Administered 2020-11-28: 3 [IU] via SUBCUTANEOUS
  Administered 2020-11-28: 13:00:00 5 [IU] via SUBCUTANEOUS
  Filled 2020-11-20 (×29): qty 1

## 2020-11-20 MED ORDER — LEVOTHYROXINE SODIUM 100 MCG PO TABS
100.0000 ug | ORAL_TABLET | Freq: Every day | ORAL | Status: DC
  Administered 2020-11-21 – 2020-12-22 (×30): 100 ug via ORAL
  Filled 2020-11-20 (×31): qty 1

## 2020-11-20 MED ORDER — MAGNESIUM SULFATE 2 GM/50ML IV SOLN
2.0000 g | Freq: Once | INTRAVENOUS | Status: AC
  Administered 2020-11-20: 2 g via INTRAVENOUS
  Filled 2020-11-20: qty 50

## 2020-11-20 MED ORDER — DULOXETINE HCL 30 MG PO CPEP
30.0000 mg | ORAL_CAPSULE | Freq: Every day | ORAL | Status: DC
  Filled 2020-11-20 (×2): qty 1

## 2020-11-20 MED ORDER — VANCOMYCIN HCL IN DEXTROSE 1-5 GM/200ML-% IV SOLN: 1000.0000 mg | Freq: Three times a day (TID) | INTRAVENOUS | Status: DC

## 2020-11-20 MED ORDER — INSULIN ASPART 100 UNIT/ML IJ SOLN
0.0000 [IU] | Freq: Three times a day (TID) | INTRAMUSCULAR | Status: DC
  Administered 2020-11-20 (×2): 3 [IU] via SUBCUTANEOUS
  Filled 2020-11-20 (×2): qty 1

## 2020-11-20 MED ORDER — DEXTROSE 5 % IV SOLN
10.0000 mg/kg | Freq: Three times a day (TID) | INTRAVENOUS | Status: DC
  Administered 2020-11-20 – 2020-11-22 (×6): 830 mg via INTRAVENOUS
  Filled 2020-11-20 (×8): qty 16.6

## 2020-11-20 MED ORDER — LIDOCAINE HCL (PF) 1 % IJ SOLN: 5.0000 mL | Freq: Once | INTRAMUSCULAR | Status: DC

## 2020-11-20 MED ORDER — VANCOMYCIN HCL IN DEXTROSE 1-5 GM/200ML-% IV SOLN: 1000.0000 mg | Freq: Two times a day (BID) | INTRAVENOUS | Status: DC

## 2020-11-20 MED ORDER — POTASSIUM CHLORIDE 10 MEQ/100ML IV SOLN
10.0000 meq | INTRAVENOUS | Status: AC
Start: 2020-11-20 — End: 2020-11-20
  Administered 2020-11-20 (×3): 10 meq via INTRAVENOUS
  Filled 2020-11-20 (×3): qty 100

## 2020-11-20 MED ORDER — LORAZEPAM 2 MG/ML IJ SOLN: 1.0000 mg | Freq: Once | INTRAMUSCULAR | Status: DC

## 2020-11-20 NOTE — ED Notes (Signed)
Patient is very difficult stick. Lab called and aware of need for re-draw on morning labs.

## 2020-11-20 NOTE — ED Notes (Signed)
Did not give rocephin due to given cefipime

## 2020-11-20 NOTE — ED Notes (Signed)
Purewick applied to patient. 

## 2020-11-20 NOTE — Progress Notes (Signed)
Inpatient Diabetes Program Recommendations  AACE/ADA: New Consensus Statement on Inpatient Glycemic Control   Target Ranges:  Prepandial:   less than 140 mg/dL      Peak postprandial:   less than 180 mg/dL (1-2 hours)      Critically ill patients:  140 - 180 mg/dL   Results for Tonya Myers, Tonya Myers (MRN [id number]) as of 11/20/2020 13:27  Ref. Range 11/20/2020 07:43 11/20/2020 11:46  Glucose-Capillary Latest Ref Range: 70 - 99 mg/dL 228 (H) 230 (H)    Review of Glycemic Control  Diabetes history: DM2 Outpatient Diabetes medications: Soliqua 50 units QAM, Metformin 1000 mg daily Current orders for Inpatient glycemic control: Novolog 0-9 units TID with meals, Novolog 0-5 units QHS; Decadron 10 mg Q6H  Inpatient Diabetes Program Recommendations:    Insulin: Please consider ordering Semglee (glargine) 20 units daily (based on 83 kg x 0.25 units) and increase Novolog correction to Novolog 0-15 units Q4H (Q4H for closer glucose monitoring and correction).  NOTE: Patient in Emergency Department. Per H&P patient admitted with leukopenia and fever with altered mentation-suspect meningitis, expressive aphasia, right upper and lower extremity weakness.  Per home med list, patient is taking Soliqua 50 units QAM and Metformin 1000 mg daily for DM control.  Patient was inpatient 06/09/20-06/27/20 (discharged to rehab); was at rehab 06/27/20-07/31/20 and discharged on Lantus 35 units daily and Metformin 500 mg BID for DM control. Noted patient sees Dr. Gabriel Carina (Endocrinologist); last seen 09/16/20 and prescribed Soliqua 48 units daily and Metformin 1000 mg BID.  Thanks, Barnie Alderman, RN, MSN, CDE Diabetes Coordinator Inpatient Diabetes Program [phone] (Team Pager from 8am to 5pm)

## 2020-11-20 NOTE — ED Notes (Signed)
Incontinence care provided for patient.  Patient incontinent of urine and stool.

## 2020-11-20 NOTE — Progress Notes (Signed)
PROGRESS NOTE    Tonya Myers  WVP:[id number] DOB: [birth year] DOA: 11/19/2020 PCP: [name], MD   Brief Narrative:  55 y.o. female with medical history significant for recent pneumococcal meningitis and bacteremia, depression, hypothyroid, IDDM2, presents to the ED for chief concern of altered mentation.   At bedside, she had expressive aphasia, right upper and lower extremity weakness. Confusion today per spouse. Can walk with a walker and assistance. Jumbled words. She has baseline weakness in the right arm compared to left arm.    Spouse reports that patient had poor PO intake for more than a week. Spouse endorses vomiting and dnies blood and black coffee ground substances.   Patient has been unable to tolerate the MRI due to severe claustrophobia.  Will attempt again with help of IV Ativan.  Lumbar puncture was performed, initial results not terribly impressive.  Not overtly concerning for meningitis.  Remains on broad-spectrum IV antibiotics and IV acyclovir.  Infectious disease consulted.  Concern for possible manifestation of underlying hematologic malignancy.  I have requested consultation from oncology.  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  She sees oncology only once yearly.   Assessment & Plan:   Principal Problem:   Altered mental status Active Problems:   DDD (degenerative disc disease), cervical   Aphasia   Anemia of chronic disease   Acute focal neurological deficit   Encephalitis   Encephalopathy acute  Acute encephalopathy Unclear etiology Differentials include meningitis, viral versus bacterial versus aseptic Also CVA, hematologic malignancy, brain abscess Infectious disease on consult Plan: Continue broad-spectrum IV antibiotics Continue IV Zovirax Continue IV steroids Monitor cultures Monitor vitals and fever curve Monitor mental status Follow-up MRI if able to be performed Depending on results of MRI may pursue CT  chest abdomen pelvis with IV contrast Appreciate ID and oncology evaluation  Pancytopenia History of CLL All 3 cell lines acutely depressed Oncology consulted Patient's CLL has been in remission since 2016 Unlikely that this represents a flare Plan: Daily CBC with differential Will likely pursue CT chest abdomen pelvis with contrast Appreciate oncology consultation and evaluation  Marked hypokalemia Suspect secondary to vomiting and poor p.o. intake Improved after replacement Check daily labs  Insulin-dependent diabetes mellitus Patient is on long-acting formulation 50 units daily at home in addition to metformin This is on hold Plan: Glargine 20 units daily Sliding scale coverage Carb modified diet when able to eat Diabetes coordinator consult  Hyperlipidemia PTA statin  Hypothyroid PTA Synthroid   DVT prophylaxis: TED hose Code Status: Full code Family Communication: None today Disposition Plan: Status is: Inpatient  Remains inpatient appropriate because:Inpatient level of care appropriate due to severity of illness  Dispo: The patient is from: Home              Anticipated d/c is to: Home              Patient currently is not medically stable to d/c.   Difficult to place patient No       Level of care: Med-Surg  Consultants:  ID Oncology  Procedures:  Lumbar puncture 7/28  Antimicrobials:  Vancomycin Ceftriaxone Acyclovir   Subjective: Patient seen and examined.  Resting comfortably in bed.  Appears confused.  No visible distress.  Does answer questions appropriately but unable to provide a good history.  Objective: Vitals:   11/20/20 0630 11/20/20 0740 11/20/20 1141 11/20/20 1157  BP: 117/77 127/88 (!) 160/82 (!) 160/82  Pulse:  87  (!)  55  Resp:  18  18  Temp:  (!) 97.4 F (36.3 C)  97.7 F (36.5 C)  TempSrc:  Oral  Oral  SpO2:  100%  99%  Weight:      Height:        Intake/Output Summary (Last 24 hours) at 11/20/2020 1431 Last  data filed at 11/20/2020 0853 Gross per 24 hour  Intake 945.81 ml  Output --  Net 945.81 ml   Filed Weights   11/20/20 0507  Weight: 83 kg    Examination:  General exam: No acute distress.  Confused Respiratory system: Lungs clear.  Normal work of breathing.  Room air Cardiovascular system: S1-S2, regular rate and rhythm, no murmurs, no pedal edema  gastrointestinal system: Soft, nontender, nondistended, normal bowel sounds Central nervous system: Alert, oriented x1, confused, no focal deficits Extremities: Decreased power symmetrically Skin: No obvious rashes or lesions Psychiatry: Judgement and insight appear impaired. Mood & affect flattened.     Data Reviewed: I have personally reviewed following labs and imaging studies  CBC: Recent Labs  Lab 11/19/20 1648 11/20/20 0955  WBC 2.4* 2.3*  NEUTROABS  --  1.4*  HGB 9.2* 8.8*  HCT 29.4* 27.9*  MCV 83.1 84.5  PLT 72* 63*   Basic Metabolic Panel: Recent Labs  Lab 11/19/20 1648 11/20/20 0602 11/20/20 0737  NA 141  --  139  K 2.8*  --  3.4*  CL 105  --  104  CO2 25  --  23  GLUCOSE 157*  --  266*  BUN 9  --  11  CREATININE 0.75  --  0.59  CALCIUM 8.3*  --  7.6*  MG  --  1.6*  --    GFR: Estimated Creatinine Clearance: 90.6 mL/min (by C-G formula based on SCr of 0.59 mg/dL). Liver Function Tests: Recent Labs  Lab 11/19/20 1648 11/20/20 0737  AST 54* 42*  ALT 5 <5  ALKPHOS 68 59  BILITOT 1.3* 0.9  PROT 7.1 6.4*  ALBUMIN 2.7* 2.3*   No results for input(s): LIPASE, AMYLASE in the last 168 hours. Recent Labs  Lab 11/20/20 0545  AMMONIA 17   Coagulation Profile: Recent Labs  Lab 11/20/20 0602  INR 1.0   Cardiac Enzymes: No results for input(s): CKTOTAL, CKMB, CKMBINDEX, TROPONINI in the last 168 hours. BNP (last 3 results) No results for input(s): PROBNP in the last 8760 hours. HbA1C: No results for input(s): HGBA1C in the last 72 hours. CBG: Recent Labs  Lab 11/20/20 0743 11/20/20 1146   GLUCAP 228* 230*   Lipid Profile: No results for input(s): CHOL, HDL, LDLCALC, TRIG, CHOLHDL, LDLDIRECT in the last 72 hours. Thyroid Function Tests: Recent Labs    11/20/20 0545  TSH 0.120*   Anemia Panel: No results for input(s): VITAMINB12, FOLATE, FERRITIN, TIBC, IRON, RETICCTPCT in the last 72 hours. Sepsis Labs: Recent Labs  Lab 11/19/20 1648 11/19/20 1846 11/20/20 0737  PROCALCITON  --   --  0.41  LATICACIDVEN 2.0* 1.9  --     Recent Results (from the past 240 hour(s))  Blood culture (routine x 2)     Status: None (Preliminary result)   Collection Time: 11/19/20  4:47 PM   Specimen: BLOOD  Result Value Ref Range Status   Specimen Description BLOOD RIGHT ANTECUBITAL  Final   Special Requests   Final    BOTTLES DRAWN AEROBIC AND ANAEROBIC Blood Culture adequate volume   Culture   Final    NO GROWTH < 24  HOURS Performed at [facility], [address] [place], [place] [address]    Report Status PENDING  Incomplete  Resp Panel by RT-PCR (Flu A&B, Covid) Nasopharyngeal Swab     Status: None   Collection Time: 11/19/20  4:48 PM   Specimen: Nasopharyngeal Swab; Nasopharyngeal(NP) swabs in vial transport medium  Result Value Ref Range Status   SARS Coronavirus 2 by RT PCR NEGATIVE NEGATIVE Final    Comment: (NOTE) SARS-CoV-2 target nucleic acids are NOT DETECTED.  The SARS-CoV-2 RNA is generally detectable in upper respiratory specimens during the acute phase of infection. The lowest concentration of SARS-CoV-2 viral copies this assay can detect is 138 copies/mL. A negative result does not preclude SARS-Cov-2 infection and should not be used as the sole basis for treatment or other patient management decisions. A negative result may occur with  improper specimen collection/handling, submission of specimen other than nasopharyngeal swab, presence of viral mutation(s) within the areas targeted by this assay, and inadequate number of viral copies(<138  copies/mL). A negative result must be combined with clinical observations, patient history, and epidemiological information. The expected result is Negative.  Fact Sheet for Patients:  EntrepreneurPulse.com.au  Fact Sheet for Healthcare Providers:  IncredibleEmployment.be  This test is no t yet approved or cleared by the [place] FDA and  has been authorized for detection and/or diagnosis of SARS-CoV-2 by FDA under an Emergency Use Authorization (EUA). This EUA will remain  in effect (meaning this test can be used) for the duration of the COVID-19 declaration under Section 564(b)(1) of the Act, 21 U.S.C.section 360bbb-3(b)(1), unless the authorization is terminated  or revoked sooner.       Influenza A by PCR NEGATIVE NEGATIVE Final   Influenza B by PCR NEGATIVE NEGATIVE Final    Comment: (NOTE) The Xpert Xpress SARS-CoV-2/FLU/RSV plus assay is intended as an aid in the diagnosis of influenza from Nasopharyngeal swab specimens and should not be used as a sole basis for treatment. Nasal washings and aspirates are unacceptable for Xpert Xpress SARS-CoV-2/FLU/RSV testing.  Fact Sheet for Patients: EntrepreneurPulse.com.au  Fact Sheet for Healthcare Providers: IncredibleEmployment.be  This test is not yet approved or cleared by the [place] FDA and has been authorized for detection and/or diagnosis of SARS-CoV-2 by FDA under an Emergency Use Authorization (EUA). This EUA will remain in effect (meaning this test can be used) for the duration of the COVID-19 declaration under Section 564(b)(1) of the Act, 21 U.S.C. section 360bbb-3(b)(1), unless the authorization is terminated or revoked.  Performed at [facility], [address] [place], [place] [address]   Blood culture (routine x 2)     Status: None (Preliminary result)   Collection Time: 11/19/20  6:46 PM   Specimen: BLOOD   Result Value Ref Range Status   Specimen Description BLOOD RIGHT ANTECUBITAL  Final   Special Requests   Final    BOTTLES DRAWN AEROBIC AND ANAEROBIC Blood Culture adequate volume   Culture   Final    NO GROWTH < 12 HOURS Performed at [facility], [address], [place],  [address]    Report Status PENDING  Incomplete  CSF culture w Gram Stain     Status: None (Preliminary result)   Collection Time: 11/20/20 11:09 AM   Specimen: PATH Cytology CSF; Cerebrospinal Fluid  Result Value Ref Range Status   Specimen Description CSF  Final   Special Requests NONE  Final   Gram Stain   Final  NO ORGANISMS SEEN WBC SEEN RED BLOOD CELLS PRESENT Performed at [facility], [address] [place], [place] [address]    Culture PENDING  Incomplete   Report Status PENDING  Incomplete         Radiology Studies: CT Head Wo Contrast  Result Date: 11/19/2020 CLINICAL DATA:  Mental status changes of unknown cause. EXAM: CT HEAD WITHOUT CONTRAST TECHNIQUE: Contiguous axial images were obtained from the base of the skull through the vertex without intravenous contrast. COMPARISON:  10/02/2020 FINDINGS: Brain: The brain shows a normal appearance without evidence of malformation, atrophy, old or acute small or large vessel infarction, mass lesion, hemorrhage, hydrocephalus or extra-axial collection. Vascular: No hyperdense vessel. No evidence of atherosclerotic calcification. Skull: Normal.  No traumatic finding.  No focal bone lesion. Sinuses/Orbits: Sinuses are clear. Orbits appear normal. Mastoids are clear. Other: None significant IMPRESSION: Normal head CT. Electronically Signed   By: Nelson Chimes M.D.   On: 11/19/2020 16:19   DG Chest Portable 1 View  Result Date: 11/19/2020 CLINICAL DATA:  Cough, fever EXAM: PORTABLE CHEST 1 VIEW COMPARISON:  10/01/2020 FINDINGS: Heart size is upper limits of normal. Low lung volumes. Mildly increased interstitial markings  bilaterally. No lobar consolidation. No pleural effusion or pneumothorax. IMPRESSION: Low lung volumes with mildly increased interstitial markings bilaterally may reflect mild edema or atypical/viral infection. Electronically Signed   By: Davina Poke D.O.   On: 11/19/2020 16:22   DG FL GUIDED LUMBAR PUNCTURE  Result Date: 11/20/2020 CLINICAL DATA:  Altered mental status EXAM: DIAGNOSTIC LUMBAR PUNCTURE UNDER FLUOROSCOPIC GUIDANCE COMPARISON:  None FLUOROSCOPY TIME:  Fluoroscopy Time:  12 seconds Radiation Exposure Index (if provided by the fluoroscopic device): 1.6 mGy PROCEDURE: Informed consent was obtained from the patient's husband prior to the procedure, including potential complications of headache, allergy, and pain. With the patient prone, the lower back was prepped with Betadine. 1% Lidocaine was used for local anesthesia. Lumbar puncture was performed at the L4-L5 level using a 20 gauge needle with return of clear CSF. 8 ml of CSF were obtained for laboratory studies. The patient tolerated the procedure well and there were no apparent complications. IMPRESSION: Technically successful fluoroscopic guided lumbar puncture. Electronically Signed   By: Macy Mis M.D.   On: 11/20/2020 11:42        Scheduled Meds:  acetaminophen  1,000 mg Oral Once   dexamethasone (DECADRON) injection  10 mg Intravenous Q6H   diltiazem  120 mg Oral Daily   DULoxetine  30 mg Oral QHS   insulin aspart  0-15 Units Subcutaneous Q4H   insulin glargine-yfgn  20 Units Subcutaneous Daily   levothyroxine  150 mcg Oral QAC breakfast   lidocaine (PF)  5 mL Other Once   LORazepam  1 mg Intravenous Once   pantoprazole  40 mg Oral Daily   rosuvastatin  10 mg Oral QHS   Continuous Infusions:  acyclovir     cefTRIAXone (ROCEPHIN)  IV Stopped (11/20/20 0730)   lactated ringers 125 mL/hr at 11/20/20 1144   vancomycin       LOS: 0 days    Time spent: 35 minutes    Sidney Ace, MD [facility] Pager [phone]  If 7PM-7AM, please contact night-coverage 11/20/2020, 2:31 PM

## 2020-11-20 NOTE — Procedures (Signed)
Lumbar Puncture Procedure Note  Tonya Myers  [id number]  1966-03-24  Date:11/20/20  Time:11:05 AM   Provider Performing:Georgie Haque   Procedure: Fluoroscopic guided lumbar puncture  Indication(s) Rule out meningitis  Consent Risks of the procedure as well as the alternatives and risks of each were explained to the patient and/or caregiver.  Consent for the procedure was obtained and is signed in the bedside chart  Anesthesia Topical only with 1% lidocaine    Time Out Verified patient identification, verified procedure, site/side was marked, verified correct patient position, special equipment/implants available, medications/allergies/relevant history reviewed, required imaging and test results available.   Sterile Technique Maximal sterile technique including sterile barrier drape, hand hygiene, sterile gloves, mask.    Procedure Description Using fluoroscopy, appropriate location for access identified.   Lidocaine used to anesthetize skin and subcutaneous tissue overlying this area.  A 20g spinal needle was then used to access the subarachnoid space. 8 mL CSF obtained. Refer to PACS for full dictation.  Complications/Tolerance None; patient tolerated the procedure well.   EBL Minimal   Specimen(s) CSF

## 2020-11-20 NOTE — Consult Note (Addendum)
NAME: Tonya Myers  DOB: [birth year]  MRN: [id number]  Date/Time: 11/20/2020 10:52 AM  REQUESTING PROVIDER: Dr. Priscella Mann Subjective:  REASON FOR CONSULT: Meningitis ? Tonya Myers is a 55 y.o. 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 Patient has had poor intake for the past 2 to 3 weeks.  Yesterday when the physical therapist was at home they found her to be confused and called EMS.  She did not have any temperature at home but when EMS checked her vitals it was 102.1.   Patient has had difficulty in swallowing with poor appetite for the past few weeks.  She was initially evaluated for trouble swallowing and pain in the mouth on 10/27/2020 by next care(notes not available.)  And apparently was prescribed amoxicillin as per husband.  Strep throat was negative.  She could not take amoxicillin because of vaginal candidiasis and her PCP prescribed her Diflucan.  On 11/06/2020 she went to [facility] for complaint of dysphagia.  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  She was unable to eat and having some difficulty in swallowing.  She had lost significant weight from the time of her meningitis but more so of lately.  She apparently has a long history of dysphagia and had seen GI on 10/09/2020 and subsequent upper GI series and gastric emptying scan performed both of which were normal.  She was started on Cymbalta on  10/15/2020 for numbness and tingling and her PCP initially thought that was could have been causing her nausea and poor appetite and it was stopped on 7/15. She did not have any fever or chills or headache. No neck pain She was working with OT and was moving with walker at home.  Her husband says she was recovering pretty well after the meningitis and the infection in the spine until recently when she has been having poor appetite and inability to eat.. In the ED BP was 138/76, temperature 102.6, sats 100%, heart rate 82. Labs revealed a WBC of 2.4, hemoglobin 9.2, platelets 72 and creatinine 0.5. Her last labs done at [facility] clinic was on 09/02/2020 and that time WBC was 6.3, Hb 11 and platelet was 345. Patient was diagnosed with CLL in 2016 when she had lymphocytosis with generalized lymphadenopathy.  Peripheral blood flow cytometry confirmed the diagnosis.  She was given rituximab and Treanda and finished the treatment in 2017.  Since then she has been followed annually by Dr. Grayland Ormond  I am asked to see the patient to rule out meningitis  As per husband patient did not have any diarrhea or abdominal pain, dysuria, cough, shortness of breath.  She has fatigue difficulty eating poor appetite and weight loss and also some difficulty in swallowing and painful swallowing. Nobody else at home was sick.  They have 4 dogs.  No travel history. No tick bites no tick removal no mosquito bites as per her husband. Past Medical History:  Diagnosis Date   Acute hemorrhoid 03/11/2015   Anxiety    Arthritis  Asthma    Cancer ([place])    Chronic back pain    CLL (chronic lymphocytic leukemia) ([place])    Depression    Diabetes mellitus without complication ([place])    Genital herpes    type 2   Hypertension    Hypothyroidism    Vertigo     Past Surgical History:  Procedure Laterality Date   ABDOMINAL HYSTERECTOMY     BILATERAL SALPINGOOPHORECTOMY  2009   BREAST BIOPSY Right 05/17/2016    FIBROADENOMATOUS CHANGE AND SCLEROSING ADENOSIS WITH   COLONOSCOPY WITH PROPOFOL N/A 06/16/2015   Procedure: COLONOSCOPY WITH PROPOFOL;  Surgeon: [name], MD;  Location: [facility] ENDOSCOPY;  Service: Endoscopy;  Laterality: N/A;   LAPAROSCOPIC SUPRACERVICAL HYSTERECTOMY  2009   due to [place] Bilateral 06/17/2020   Procedure: THORACIC LAMINECTOMY FOR EPIDURAL ABSCESS;  Surgeon: Deetta Perla, MD;  Location: [facility] ORS;  Service: Neurosurgery;  Laterality: Bilateral;    Social History   Socioeconomic History   Marital status: Single    Spouse name: Not on file   Number of children: Not on file   Years of education: Not on file   Highest education level: Not on file  Occupational History   Not on file  Tobacco Use   Smoking status: Never   Smokeless tobacco: Never  Vaping Use   Vaping Use: Never used  Substance and Sexual Activity   Alcohol use: Yes    Alcohol/week: 1.0 standard drink    Types: 1 Cans of beer per week   Drug use: No   Sexual activity: Not Currently    Birth control/protection: None  Other Topics Concern   Not on file  Social History Narrative   Not on file   Social Determinants of Health   Financial Resource Strain: Not on file  Food Insecurity: Not on file  Transportation Needs: Not on file  Physical Activity: Not on file  Stress: Not on file  Social Connections: Not on file  Intimate Partner Violence: Not on file    Family History  Problem Relation Age of Onset   Cancer Paternal Aunt    Breast cancer Maternal Aunt 57   Allergies  Allergen Reactions   Amoxapine    Penicillins    I? Current Facility-Administered Medications  Medication Dose Route Frequency Provider Last Rate Last Admin   acetaminophen (TYLENOL) tablet 650 mg  650 mg Oral Q6H PRN Cox, Amy N, DO       Or   acetaminophen (TYLENOL) suppository 650 mg  650 mg Rectal Q6H PRN Cox, Amy N, DO       acetaminophen (TYLENOL)  tablet 1,000 mg  1,000 mg Oral Once Cox, Amy N, DO       acyclovir (ZOVIRAX) 830 mg in dextrose 5 % 150 mL IVPB  10 mg/kg Intravenous Q8H Rauer, Samantha O, RPH       albuterol (PROVENTIL) (2.5 MG/3ML) 0.083% nebulizer solution 3 mL  3 mL Inhalation Q6H PRN Cox, Amy N, DO       cefTRIAXone (ROCEPHIN) 2 g in sodium chloride 0.9 % 100 mL IVPB  2 g Intravenous Q12H Shanlever, Pierce Crane, RPH   Stopped at 11/20/20 0730   dexamethasone (DECADRON) injection 10 mg  10 mg Intravenous Q6H Cox, Amy N, DO   10 mg at 11/20/20 0644   diltiazem (CARDIZEM CD) 24 hr capsule 120 mg  120 mg Oral Daily Cox, Amy N, DO  DULoxetine (CYMBALTA) DR capsule 30 mg  30 mg Oral QHS Cox, Amy N, DO       insulin aspart (novoLOG) injection 0-5 Units  0-5 Units Subcutaneous QHS Cox, Amy N, DO       insulin aspart (novoLOG) injection 0-9 Units  0-9 Units Subcutaneous TID WC Cox, Amy N, DO   3 Units at 11/20/20 0757   lactated ringers infusion   Intravenous Continuous Rauer, Forde Dandy, RPH       levothyroxine (SYNTHROID) tablet 150 mcg  150 mcg Oral QAC breakfast Cox, Amy N, DO       LORazepam (ATIVAN) injection 1 mg  1 mg Intravenous PRN Cox, Amy N, DO       LORazepam (ATIVAN) injection 1 mg  1 mg Intravenous Once Sreenath, Sudheer B, MD       ondansetron (ZOFRAN) tablet 4 mg  4 mg Oral Q6H PRN Cox, Amy N, DO       Or   ondansetron (ZOFRAN) injection 4 mg  4 mg Intravenous Q6H PRN Cox, Amy N, DO       pantoprazole (PROTONIX) EC tablet 40 mg  40 mg Oral Daily Cox, Amy N, DO       rosuvastatin (CRESTOR) tablet 10 mg  10 mg Oral QHS Cox, Amy N, DO       vancomycin (VANCOCIN) IVPB 1000 mg/200 mL premix  1,000 mg Intravenous Q12H Rauer, Forde Dandy, RPH       Current Outpatient Medications  Medication Sig Dispense Refill   acetaminophen (TYLENOL) 325 MG tablet Take 2 tablets (650 mg total) by mouth every 6 (six) hours as needed for mild pain (or Fever >/= 101).     albuterol (VENTOLIN HFA) 108 (90 Base) MCG/ACT inhaler Inhale  into the lungs.     aspirin EC 81 MG tablet Take 1 tablet (81 mg total) by mouth daily. Swallow whole. 30 tablet 11   diclofenac Sodium (VOLTAREN) 1 % GEL Apply 2 g topically 4 (four) times daily. 2 g 0   diltiazem (DILACOR XR) 120 MG 24 hr capsule Take 1 capsule (120 mg total) by mouth daily. 30 capsule 0   DULoxetine (CYMBALTA) 30 MG capsule Take 1 capsule (30 mg total) by mouth at bedtime. X 1 week- then 2 capsule/60 mg nightly- for nerve pain- 60 capsule 3   ezetimibe (ZETIA) 10 MG tablet Take 1 tablet (10 mg total) by mouth daily. 30 tablet 0   fluticasone (FLONASE) 50 MCG/ACT nasal spray 1 spray 2 (two) times daily.     Insulin Glargine-Lixisenatide 100-33 UNT-MCG/ML SOPN Inject 50 Units into the skin every morning.     levalbuterol (XOPENEX) 1.25 MG/3ML nebulizer solution Take 1.25 mg by nebulization every 6 (six) hours as needed.     levothyroxine (SYNTHROID) 150 MCG tablet Take 1 tablet (150 mcg total) by mouth daily before breakfast. 30 tablet 0   magnesium oxide (MAG-OX) 400 MG tablet Take 400 mg by mouth 2 (two) times daily.     metFORMIN (GLUCOPHAGE-XR) 500 MG 24 hr tablet Take 1 tablet (500 mg total) by mouth 2 (two) times daily. (Patient taking differently: Take 1,000 mg by mouth daily. With dinner.) 60 tablet 0   methocarbamol (ROBAXIN) 750 MG tablet Take 750 mg by mouth 3 (three) times daily.     montelukast (SINGULAIR) 10 MG tablet Take 1 tablet (10 mg total) by mouth at bedtime. 30 tablet 0   pantoprazole (PROTONIX) 40 MG tablet Take 1 tablet (40 mg total)  by mouth daily. 30 tablet 0   pregabalin (LYRICA) 100 MG capsule Take by mouth 2 (two) times daily.     rosuvastatin (CRESTOR) 10 MG tablet Take 1 tablet (10 mg total) by mouth at bedtime. 30 tablet 0   traMADol (ULTRAM) 50 MG tablet Take 1 tablet (50 mg total) by mouth every 6 (six) hours as needed for moderate pain. 60 tablet 5   valACYclovir (VALTREX) 1000 MG tablet Take 1 tablet (1,000 mg total) by mouth 2 (two) times  daily. 60 tablet 1   Continuous Blood Gluc Sensor (DEXCOM G6 SENSOR) MISC SMARTSIG:1 Each Topical Every 10 Days     methocarbamol (ROBAXIN) 500 MG tablet Take 1 tablet (500 mg total) by mouth 3 (three) times daily. (Patient not taking: Reported on 11/20/2020) 90 tablet 0     Abtx:  Anti-infectives (From admission, onward)    Start     Dose/Rate Route Frequency Ordered Stop   11/20/20 1500  vancomycin (VANCOCIN) IVPB 1000 mg/200 mL premix        1,000 mg 200 mL/hr over 60 Minutes Intravenous Every 12 hours 11/20/20 0959     11/20/20 1400  acyclovir (ZOVIRAX) 830 mg in dextrose 5 % 150 mL IVPB        10 mg/kg  83 kg 166.6 mL/hr over 60 Minutes Intravenous Every 8 hours 11/20/20 0951     11/20/20 1100  vancomycin (VANCOCIN) IVPB 1000 mg/200 mL premix  Status:  Discontinued        1,000 mg 200 mL/hr over 60 Minutes Intravenous Every 8 hours 11/20/20 0415 11/20/20 0959   11/20/20 0600  cefTRIAXone (ROCEPHIN) 2 g in sodium chloride 0.9 % 100 mL IVPB        2 g 200 mL/hr over 30 Minutes Intravenous Every 12 hours 11/20/20 0153     11/20/20 0000  cefTRIAXone (ROCEPHIN) 2 g in sodium chloride 0.9 % 100 mL IVPB  Status:  Discontinued        2 g 200 mL/hr over 30 Minutes Intravenous Every 12 hours 11/19/20 2303 11/20/20 0153   11/19/20 2330  acyclovir (ZOVIRAX) 730 mg in dextrose 5 % 150 mL IVPB  Status:  Discontinued        10 mg/kg  73.1 kg (Ideal) 164.6 mL/hr over 60 Minutes Intravenous Every 8 hours 11/19/20 2324 11/20/20 0951   11/19/20 2230  vancomycin (VANCOREADY) IVPB 1750 mg/350 mL        1,750 mg 175 mL/hr over 120 Minutes Intravenous  Once 11/19/20 2215 11/20/20 0314   11/19/20 2215  aztreonam (AZACTAM) 2 g in sodium chloride 0.9 % 100 mL IVPB  Status:  Discontinued        2 g 200 mL/hr over 30 Minutes Intravenous  Once 11/19/20 2211 11/19/20 2214   11/19/20 2215  metroNIDAZOLE (FLAGYL) IVPB 500 mg        500 mg 100 mL/hr over 60 Minutes Intravenous  Once 11/19/20 2211 11/20/20  0115   11/19/20 2215  vancomycin (VANCOCIN) IVPB 1000 mg/200 mL premix  Status:  Discontinued        1,000 mg 200 mL/hr over 60 Minutes Intravenous  Once 11/19/20 2211 11/19/20 2215   11/19/20 2215  ceFEPIme (MAXIPIME) 2 g in sodium chloride 0.9 % 100 mL IVPB        2 g 200 mL/hr over 30 Minutes Intravenous  Once 11/19/20 2214 11/19/20 2340       REVIEW OF SYSTEMS:  NA Objective:  VITALS:  BP 127/88  Pulse 87   Temp (!) 97.4 F (36.3 C) (Oral)   Resp 18   Ht 6' (1.829 m)   Wt 83 kg   SpO2 100%   BMI 24.82 kg/m  PHYSICAL EXAM:  General: Awake, knows her name knows her husband's name and she is verbal but not oriented in place or time. Unable to articulate her symptoms.  She does follow commands like moving her head and arms. Head: Normocephalic, without obvious abnormality, atraumatic. Eyes: Conjunctivae clear, anicteric sclerae. Pupils are equal .  ? Divergence of rt eye ENT Nares normal. No drainage or sinus tenderness. Oral cavity prominent palatine tori Hemorrhagic spots   Neck: Supple, symmetrical, no adenopathy, thyroid: non tender no carotid bruit and no JVD. Back: No CVA tenderness. Lungs: Clear to auscultation bilaterally. No Wheezing or Rhonchi. No rales. Heart: Regular rate and rhythm, no murmur, rub or gallop. Abdomen: Soft, non-tender,not distended. Bowel sounds normal. No masses Extremities: Left leg a small macular area of black-blue skin  skin: As above Lymph: Cervical, supraclavicular normal. Neurologic: Awake but not oriented in place, time. Moves all 4 limbs Pertinent Labs Lab Results CBC    Component Value Date/Time   WBC 2.3 (L) 11/20/2020 0955   RBC 3.30 (L) 11/20/2020 0955   HGB 8.8 (L) 11/20/2020 0955   HCT 27.9 (L) 11/20/2020 0955   PLT 63 (L) 11/20/2020 0955   MCV 84.5 11/20/2020 0955   MCH 26.7 11/20/2020 0955   MCHC 31.5 11/20/2020 0955   RDW 16.5 (H) 11/20/2020 0955   LYMPHSABS 0.8 11/20/2020 0955   MONOABS 0.1 11/20/2020 0955    EOSABS 0.0 11/20/2020 0955   BASOSABS 0.0 11/20/2020 0955    CMP Latest Ref Rng & Units 11/20/2020 11/19/2020 10/02/2020  Glucose 70 - 99 mg/dL 266(H) 157(H) -  BUN 6 - 20 mg/dL 11 9 -  Creatinine 0.44 - 1.00 mg/dL 0.59 0.75 0.90  Sodium 135 - 145 mmol/L 139 141 -  Potassium 3.5 - 5.1 mmol/L 3.4(L) 2.8(L) -  Chloride 98 - 111 mmol/L 104 105 -  CO2 22 - 32 mmol/L 23 25 -  Calcium 8.9 - 10.3 mg/dL 7.6(L) 8.3(L) -  Total Protein 6.5 - 8.1 g/dL 6.4(L) 7.1 -  Total Bilirubin 0.3 - 1.2 mg/dL 0.9 1.3(H) -  Alkaline Phos 38 - 126 U/L 59 68 -  AST 15 - 41 U/L 42(H) 54(H) -  ALT 0 - 44 U/L <5 5 -      Microbiology: Recent Results (from the past 240 hour(s))  Blood culture (routine x 2)     Status: None (Preliminary result)   Collection Time: 11/19/20  4:47 PM   Specimen: BLOOD  Result Value Ref Range Status   Specimen Description BLOOD RIGHT ANTECUBITAL  Final   Special Requests   Final    BOTTLES DRAWN AEROBIC AND ANAEROBIC Blood Culture adequate volume   Culture   Final    NO GROWTH < 24 HOURS Performed at [facility], [address] [place], [place] [address]    Report Status PENDING  Incomplete  Resp Panel by RT-PCR (Flu A&B, Covid) Nasopharyngeal Swab     Status: None   Collection Time: 11/19/20  4:48 PM   Specimen: Nasopharyngeal Swab; Nasopharyngeal(NP) swabs in vial transport medium  Result Value Ref Range Status   SARS Coronavirus 2 by RT PCR NEGATIVE NEGATIVE Final    Comment: (NOTE) SARS-CoV-2 target nucleic acids are NOT DETECTED.  The SARS-CoV-2 RNA is generally detectable in upper respiratory  specimens during the acute phase of infection. The lowest concentration of SARS-CoV-2 viral copies this assay can detect is 138 copies/mL. A negative result does not preclude SARS-Cov-2 infection and should not be used as the sole basis for treatment or other patient management decisions. A negative result may occur with  improper specimen collection/handling,  submission of specimen other than nasopharyngeal swab, presence of viral mutation(s) within the areas targeted by this assay, and inadequate number of viral copies(<138 copies/mL). A negative result must be combined with clinical observations, patient history, and epidemiological information. The expected result is Negative.  Fact Sheet for Patients:  EntrepreneurPulse.com.au  Fact Sheet for Healthcare Providers:  IncredibleEmployment.be  This test is no t yet approved or cleared by the [place] FDA and  has been authorized for detection and/or diagnosis of SARS-CoV-2 by FDA under an Emergency Use Authorization (EUA). This EUA will remain  in effect (meaning this test can be used) for the duration of the COVID-19 declaration under Section 564(b)(1) of the Act, 21 U.S.C.section 360bbb-3(b)(1), unless the authorization is terminated  or revoked sooner.       Influenza A by PCR NEGATIVE NEGATIVE Final   Influenza B by PCR NEGATIVE NEGATIVE Final    Comment: (NOTE) The Xpert Xpress SARS-CoV-2/FLU/RSV plus assay is intended as an aid in the diagnosis of influenza from Nasopharyngeal swab specimens and should not be used as a sole basis for treatment. Nasal washings and aspirates are unacceptable for Xpert Xpress SARS-CoV-2/FLU/RSV testing.  Fact Sheet for Patients: EntrepreneurPulse.com.au  Fact Sheet for Healthcare Providers: IncredibleEmployment.be  This test is not yet approved or cleared by the [place] FDA and has been authorized for detection and/or diagnosis of SARS-CoV-2 by FDA under an Emergency Use Authorization (EUA). This EUA will remain in effect (meaning this test can be used) for the duration of the COVID-19 declaration under Section 564(b)(1) of the Act, 21 U.S.C. section 360bbb-3(b)(1), unless the authorization is terminated or revoked.  Performed at [facility], [address] [place], [place] [address]   Blood culture (routine x 2)     Status: None (Preliminary result)   Collection Time: 11/19/20  6:46 PM   Specimen: BLOOD  Result Value Ref Range Status   Specimen Description BLOOD RIGHT ANTECUBITAL  Final   Special Requests   Final    BOTTLES DRAWN AEROBIC AND ANAEROBIC Blood Culture adequate volume   Culture   Final    NO GROWTH < 12 HOURS Performed at [facility], [address] [place], [place] [address]    Report Status PENDING  Incomplete   Cryptococcal antigen negative VDRL pending HSV 1 HSV-2 pending  IMAGING RESULTS: CT head no acute findings.  I have personally reviewed the films No infiltrate in the chest.  Impression/Recommendation Encephalopathy.  Unclear etiology.  No fever at home but Temperature documented 102 at 1 time in the hospital.  Has pancytopenia Clinically she is awake but confused. CSF done showed WBC of 5 ,  93% lymphocytes, 0 RBC and total protein of 158. Could she have aseptic meningitis, like viral meningitis . TB meningitis less likely as pretty acute in onset Cryptococcal meningitis unlikely as antigen negative Unlikely to be bacterial meningitis.  Decadron is not needed from meningitis perspective.    Differential diagnosis is lymphoma related CNS involvement. HSV PCR has been sent Cryptococcal antigen is negative CSF culture is pending Will continue acyclovir Will DC vancomycin May continue IV ceftriaxone for now. History of severe pneumococcal  meningitis, pneumococcal bacteremia, thoracic epidural abscesses, lumbar paraspinal abscess in February 2022 needing multiple level laminectomy ascending thoracic spine and drains in lumbar spine area and received 6 weeks of IV ceftriaxone. Was in rehab for a month Has been improving slowly.  Severe pancytopenia. Less likely to be an infectious cause.  No tickborne illness. Need to check B12 folate Heme-onc consult pending.  4-week history  of poor appetite, weight loss, difficulty swallowing. Need to rule out lymphoma Recommend MRI brain CSF cytology has been sent Recommend heme-onc consult  Recommend ENT to assess her throat. ? __Diabetes mellitus  Patient was on suppressive therapy for herpes simplex. _________________________________________________ Discussed with husband and requesting provider [facility] is covering me 7/29-8/1.  Call them if needed. Note:  This document was prepared using Dragon voice recognition software and may include unintentional dictation errors.

## 2020-11-20 NOTE — ED Notes (Signed)
Radiology department staff reports not being able to contact ordering physician (MD Cox, Amy) for procedure; Reports not being able to conduct procedure until 0700

## 2020-11-20 NOTE — ED Notes (Signed)
Interventional radiology to take patient for LP this afternoon.

## 2020-11-20 NOTE — ED Notes (Signed)
Patient is eating [employer] [place].  Patient tolerating well.

## 2020-11-20 NOTE — Consult Note (Signed)
Pharmacy Antibiotic Note  Tonya Myers is a 55 y.o. 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 Pt has a recent history for pneumococcal meningitis and bacteremia (05/2020). Pharmacy has been consulted for Vancomycin/Acyclovir dosing.  Plan: Continue Ceftriaxone 2g q12h Change Acyclovir to 830 mg q8h (used actual body weight since BMI not >30) and start LR @125ml /hr to help prevent renal toxicity.  Change vancomycin to 1000mg  q12h as patient was previously therapeutic on this dose Will follow with trough based dosing for a target trough of 15-20 for meningitis Monitor renal function and adjust doses as clinically indicated  Height: 6' (182.9 cm) Weight: 83 kg (182 lb 15.7 oz) IBW/kg (Calculated) : 73.1  Temp (24hrs), Avg:99.2 F (37.3 C), Min:97.4 F (36.3 C), Max:102.6 F (39.2 C)  Recent Labs  Lab 11/19/20 1648 11/19/20 1846 11/20/20 0737  WBC 2.4*  --   --   CREATININE 0.75  --  0.59  LATICACIDVEN 2.0* 1.9  --      Estimated Creatinine Clearance: 90.6 mL/min (by C-G formula based on SCr of 0.59 mg/dL).    Allergies  Allergen Reactions   Amoxapine    Penicillins     Antimicrobials this admission: Cefepime x 1 7/27 Ceftriaxone 7/28 >> Vancomycin 7/28 >> Acyclovir 7/28 >>  Dose adjustments this admission: 7/28 acyclovir  Microbiology results: 7/27 BCx: NG <24h 7/27 UCx: sent COVID/FLU NEG  Thank you for allowing pharmacy to be a part of this patient's care.  Sherilyn Banker, PharmD Clinical Pharmacist 11/20/2020 9:51 AM

## 2020-11-20 NOTE — ED Notes (Signed)
Patient taken to Interventional Radiology

## 2020-11-20 NOTE — ED Notes (Signed)
Adult Brief changed; soft brown stool noted; new diaper/ pad applied; patient tolerated well; rear skin asymptomatic/ intact

## 2020-11-20 NOTE — ED Notes (Signed)
Assumption of patient care at 0117

## 2020-11-20 NOTE — ED Notes (Signed)
Patient returned from Interventional Radiology.

## 2020-11-20 NOTE — Progress Notes (Signed)
Code sepsis tracking by [facility]

## 2020-11-20 NOTE — ED Notes (Signed)
MD alerted pf patient's bradycardia at this time.  HR 56

## 2020-11-20 NOTE — Consult Note (Signed)
Pharmacy Antibiotic Note  Tonya Myers is a 55 y.o. 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    Pharmacy has been consulted for Vancomycin/Acyclovir dosing dosing.  Plan: Will continue Ceftriaxone 2g q12h as ordered  Will start Acyclovir 730mg  q8h (used ideal body weight 10mg /kg)  Pt to receive 1750mg  loading dose vancomycin, will follow with trough based dosing for a target trough of 15-20 for meningitis  Will dose 1000mg  q8h once loading dose given     Temp (24hrs), Avg:100.2 F (37.9 C), Min:97.8 F (36.6 C), Max:102.6 F (39.2 C)  Recent Labs  Lab 11/19/20 1648 11/19/20 1846  WBC 2.4*  --   CREATININE 0.75  --   LATICACIDVEN 2.0* 1.9    Estimated Creatinine Clearance: 90.6 mL/min (by C-G formula based on SCr of 0.75 mg/dL).    Allergies  Allergen Reactions   Amoxapine    Penicillins     Antimicrobials this admission: Cefepime x 1 7/27 Ceftriaxone 7/28 >> Vancomycin 7/28 >> Acyclovir 7/28 >>  Dose adjustments this admission: None  Microbiology results: 7/27 BCx: pending 7/27 UCx: pending  COVID/FLU NEG  Thank you for allowing pharmacy to be a part of this patient's care.  Lu Duffel, PharmD, BCPS Clinical Pharmacist 11/20/2020 12:29 AM

## 2020-11-20 NOTE — Plan of Care (Signed)
  Problem: Health Behavior/Discharge Planning: Goal: Ability to manage health-related needs will improve Outcome: Progressing   

## 2020-11-20 NOTE — TOC Initial Note (Signed)
Transition of Care [facility]) - Initial/Assessment Note    Patient Details  Name: Tonya Myers MRN: [id number] Date of Birth: April 09, 1966  Transition of Care [facility]) [name] Contact:    Adelene Amas, [place] Phone Number: 11/20/2020, 2:54 PM  Clinical Narrative:                  Patient active with [facility] for PT/OT services.        Patient Goals and CMS Choice        Expected Discharge Plan and Services                                                Prior Living Arrangements/Services                       Activities of Daily Living      Permission Sought/Granted                  Emotional Assessment              Admission diagnosis:  Altered mental status [R41.82] Encephalopathy acute [G93.40] Patient Active Problem List   Diagnosis Date Noted   Encephalopathy acute 11/20/2020   Altered mental status 11/19/2020   Right arm weakness 10/21/2020   Quadriplegia ([place]) 10/10/2020   Encephalitis 10/10/2020   Wheelchair dependence 10/10/2020   Acute focal neurological deficit 10/02/2020   Functional paraparesis 10/02/2020   Left upper quadrant abdominal pain    Pain    Chronic pain of both knees    Hypokalemia    Urinary retention    Slow transit constipation    Meningitis    Labile blood glucose    Uncontrolled type 2 diabetes mellitus with hyperosmolar nonketotic hyperglycemia (HCC)    Transaminitis    Sleep disturbance    Anemia of chronic disease    Bacterial spinal epidural abscess 06/27/2020   Obesity (BMI 30.0-34.9)    Hypomagnesemia    Hypernatremia    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HCC)    Epidural abscess    Pneumococcal meningitis    Aphasia 06/10/2020   Hyperglycemic crisis in diabetes mellitus ([place]) 02/72/5366   Acute metabolic encephalopathy 44/06/4740   Severe sepsis (HCC)    Diabetic ketoacidosis without coma associated with type 2 diabetes mellitus ([place])    AKI (acute kidney injury) ([place])    Elevated  LFTs    Acute encephalopathy 06/09/2020   Bacterial vaginosis 11/01/2016   Chronic right shoulder pain 03/23/2016   Numbness and tingling 03/23/2016   Uncontrolled type 2 diabetes mellitus with hyperglycemia, without long-term current use of insulin ([place]) 10/27/2015   DDD (degenerative disc disease), cervical 07/29/2015   Cervical disc disorder with radiculopathy of cervical region 07/29/2015   Cervical facet syndrome 07/29/2015   DDD (degenerative disc disease), lumbar 07/29/2015   Facet syndrome, lumbar 07/29/2015   Bilateral occipital neuralgia 07/29/2015   Sacroiliac joint dysfunction 07/29/2015   DJD of shoulder 07/29/2015   Herpes simplex vulvovaginitis 05/29/2015   Acute hemorrhoid 03/11/2015   CLL (chronic lymphocytic leukemia) ([place]) 01/15/2015   Hypothyroidism 12/26/2014   Arthritis 12/26/2014   Carpal tunnel syndrome, bilateral 12/26/2014   Controlled type 2 diabetes mellitus without complication, without long-term current use of insulin ([place]) 12/26/2014   Essential hypertension 12/26/2014   Hyperlipidemia, mixed 12/26/2014   PCP:  [name], MD  Pharmacy:   [id number] - [place], [place] - [address] [address] Phone: [phone] Fax: [phone]     Social Determinants of Health (SDOH) Interventions    Readmission Risk Interventions No flowsheet data found.

## 2020-11-20 NOTE — ED Notes (Signed)
Lab contacted to draw CBC on patient

## 2020-11-20 NOTE — ED Notes (Signed)
MD at bedside. 

## 2020-11-21 ENCOUNTER — Inpatient Hospital Stay: Payer: Medicare Other

## 2020-11-21 DIAGNOSIS — R4701 Aphasia: Secondary | ICD-10-CM | POA: Diagnosis not present

## 2020-11-21 DIAGNOSIS — R404 Transient alteration of awareness: Secondary | ICD-10-CM

## 2020-11-21 DIAGNOSIS — C9110 Chronic lymphocytic leukemia of B-cell type not having achieved remission: Secondary | ICD-10-CM | POA: Diagnosis not present

## 2020-11-21 LAB — COMPREHENSIVE METABOLIC PANEL
ALT: <5 U/L (ref 0–44)
AST: 31 U/L (ref 15–41)
Albumin: 2.4 g/dL — ABNORMAL LOW (ref 3.5–5.0)
Alkaline Phosphatase: 56 U/L (ref 38–126)
Anion gap: 5 (ref 5–15)
BUN: 8 mg/dL (ref 6–20)
CO2: 28 mmol/L (ref 22–32)
Calcium: 8.1 mg/dL — ABNORMAL LOW (ref 8.9–10.3)
Chloride: 107 mmol/L (ref 98–111)
Creatinine, Ser: 0.5 mg/dL (ref 0.44–1.00)
GFR, Estimated: >60 mL/min (ref >60)
Glucose, Bld: 229 mg/dL — ABNORMAL HIGH (ref 70–99)
Potassium: 2.5 mmol/L — CL (ref 3.5–5.1)
Sodium: 140 mmol/L (ref 135–145)
Total Bilirubin: 0.5 mg/dL (ref 0.3–1.2)
Total Protein: 6.8 g/dL (ref 6.5–8.1)

## 2020-11-21 LAB — CBC WITH DIFFERENTIAL/PLATELET
Abs Immature Granulocytes: 0.02 10*3/uL (ref 0.00–0.07)
Basophils Absolute: 0 10*3/uL (ref 0.0–0.1)
Basophils Relative: 0 %
Eosinophils Absolute: 0 10*3/uL (ref 0.0–0.5)
Eosinophils Relative: 0 %
HCT: 25.6 % — ABNORMAL LOW (ref 36.0–46.0)
Hemoglobin: 8.1 g/dL — ABNORMAL LOW (ref 12.0–15.0)
Immature Granulocytes: 1 %
Lymphocytes Relative: 25 %
Lymphs Abs: 0.9 10*3/uL (ref 0.7–4.0)
MCH: 26.3 pg (ref 26.0–34.0)
MCHC: 31.6 g/dL (ref 30.0–36.0)
MCV: 83.1 fL (ref 80.0–100.0)
Monocytes Absolute: 0.2 10*3/uL (ref 0.1–1.0)
Monocytes Relative: 5 %
Neutro Abs: 2.5 10*3/uL (ref 1.7–7.7)
Neutrophils Relative %: 69 %
Platelets: 72 10*3/uL — ABNORMAL LOW (ref 150–400)
RBC: 3.08 MIL/uL — ABNORMAL LOW (ref 3.87–5.11)
RDW: 16.3 % — ABNORMAL HIGH (ref 11.5–15.5)
Smear Review: NORMAL
WBC: 3.6 10*3/uL — ABNORMAL LOW (ref 4.0–10.5)
nRBC: 0 % (ref 0.0–0.2)

## 2020-11-21 LAB — IRON AND TIBC
Iron: 46 ug/dL (ref 28–170)
Saturation Ratios: 31 % (ref 10.4–31.8)
TIBC: 147 ug/dL — ABNORMAL LOW (ref 250–450)
UIBC: 101 ug/dL

## 2020-11-21 LAB — GLUCOSE, CAPILLARY
Glucose-Capillary: 217 mg/dL — ABNORMAL HIGH (ref 70–99)
Glucose-Capillary: 222 mg/dL — ABNORMAL HIGH (ref 70–99)
Glucose-Capillary: 270 mg/dL — ABNORMAL HIGH (ref 70–99)
Glucose-Capillary: 272 mg/dL — ABNORMAL HIGH (ref 70–99)
Glucose-Capillary: 277 mg/dL — ABNORMAL HIGH (ref 70–99)
Glucose-Capillary: 378 mg/dL — ABNORMAL HIGH (ref 70–99)

## 2020-11-21 LAB — CYTOLOGY - NON PAP

## 2020-11-21 LAB — FOLATE: Folate: 13.2 ng/mL (ref >5.9)

## 2020-11-21 LAB — URINE CULTURE: Culture: <10000 — AB

## 2020-11-21 LAB — VITAMIN B12: Vitamin B-12: 761 pg/mL (ref 180–914)

## 2020-11-21 LAB — FERRITIN: Ferritin: 1032 ng/mL — ABNORMAL HIGH (ref 11–307)

## 2020-11-21 LAB — LIPASE, BLOOD: Lipase: 633 U/L — ABNORMAL HIGH (ref 11–51)

## 2020-11-21 IMAGING — CT CT HEAD WO/W CM
5 of 7 series · 17 of 47 positions shown, 19 images · IV contrast (omnipaque)
Comparison: Prior head CT examinations [DATE] and earlier.

CLINICAL DATA: Metastatic disease evaluation. Additional provided:
Patient with history of recent pneumococcal meningitis and
bacteremia, diabetes mellitus presenting with altered mental status.

EXAM:
CT HEAD WITHOUT AND WITH CONTRAST
TECHNIQUE: Contiguous axial images were obtained from the base of the skull
through the vertex without and with intravenous contrast
CONTRAST:  100mL OMNIPAQUE IOHEXOL 350 MG/ML SOLN

[Series 3: head wo · axial · 0.39mm/px · z∈[+258,+348]mm · 4 of 32 slices shown (1 of 2)]
[im 7/32  brain]
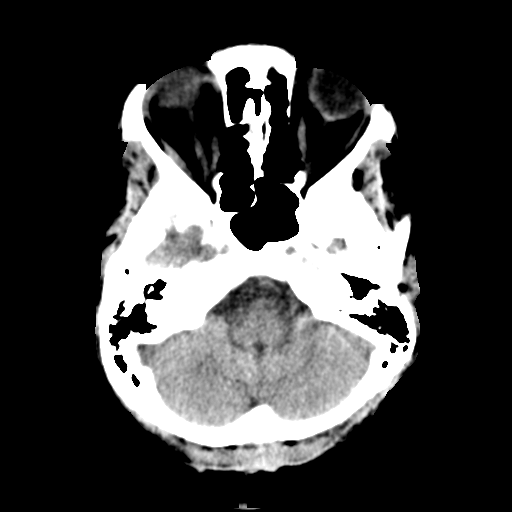
[im 13/32  brain]
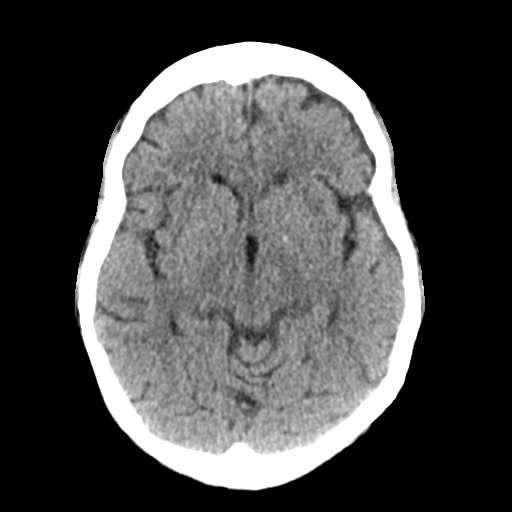
[im 19/32  brain]
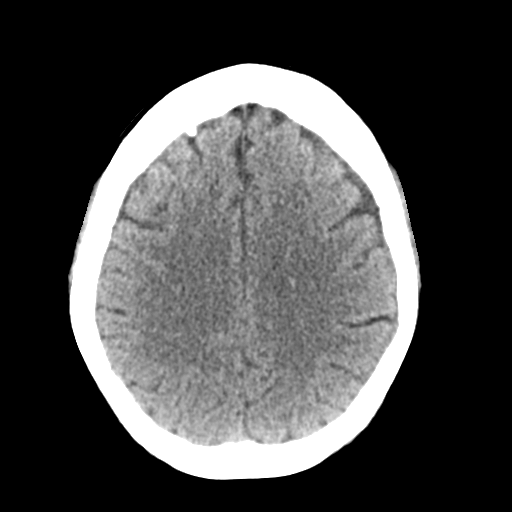
[im 25/32  brain]
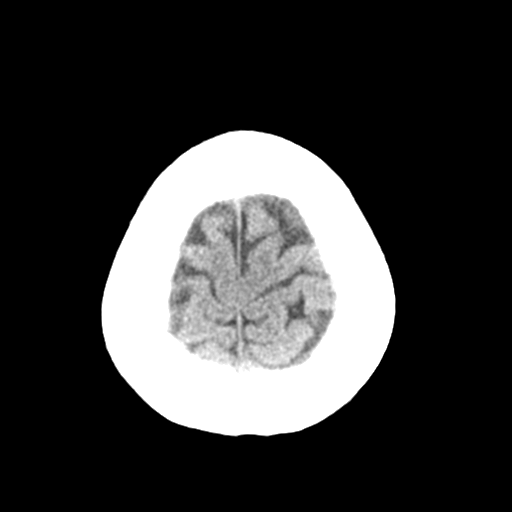

[Series 7: head wo · axial · 0.44mm/px · z∈[+244,+359]mm · 5 of 35 slices shown, 7 images (2 of 2)]
[im 6/35  brain]
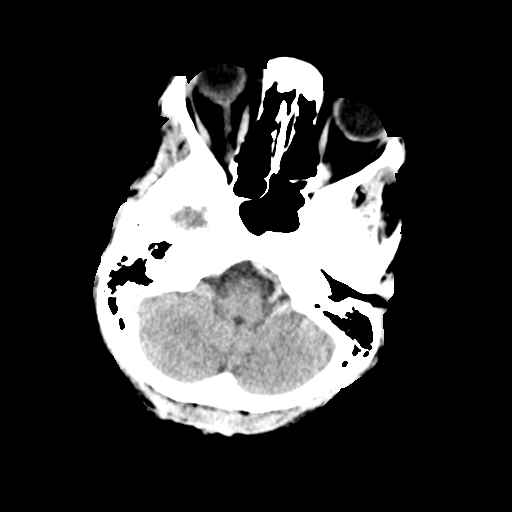
[im 6/35  bone]
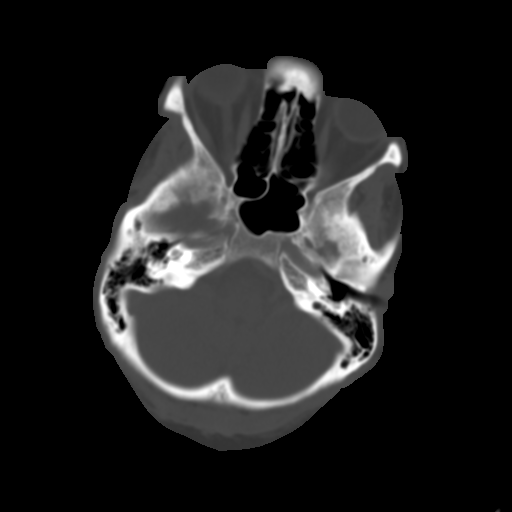
[im 12/35  brain]
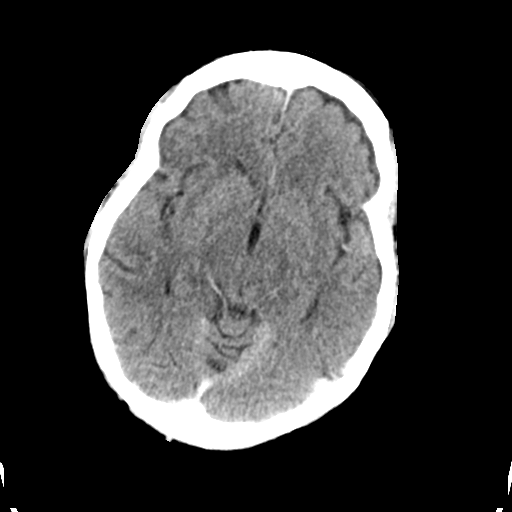
[im 18/35  brain]
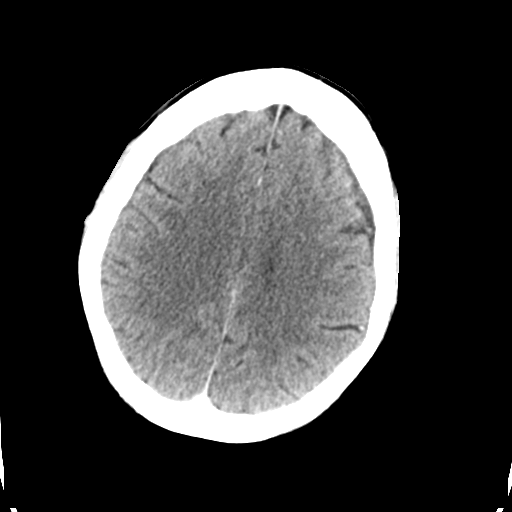
[im 23/35  brain]
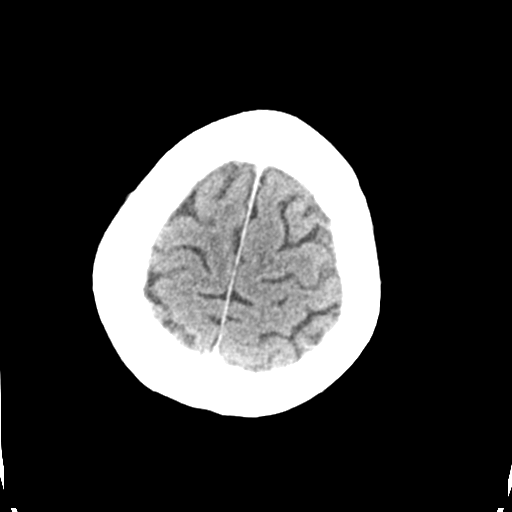
[im 29/35  brain]
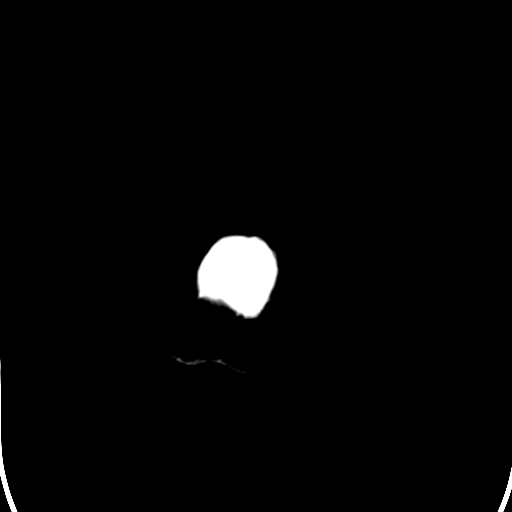
[im 29/35  bone]
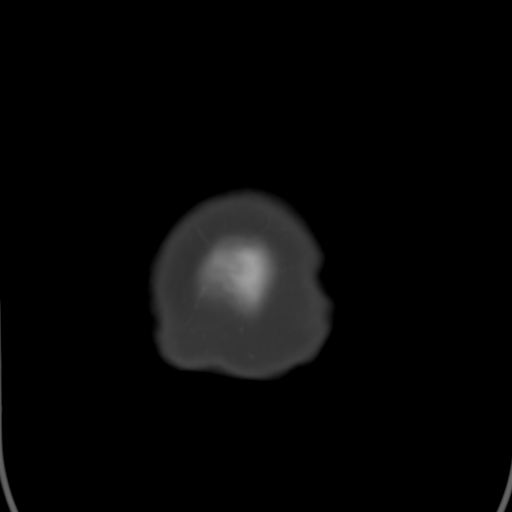

[Series 8: head bone · axial · 0.44mm/px · z∈[+229,+275]mm · 3 of 91 slices shown]
[im 6/91  bone]
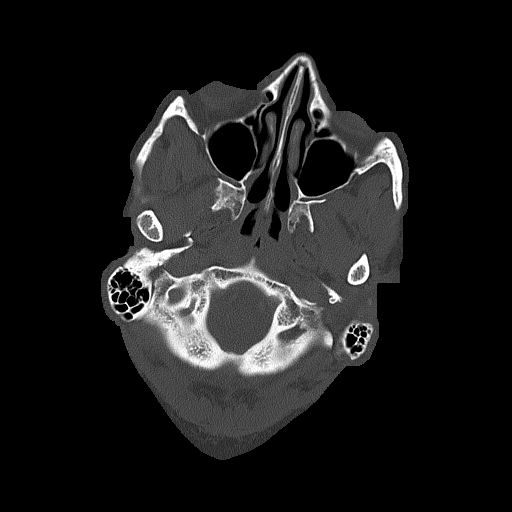
[im 17/91  bone]
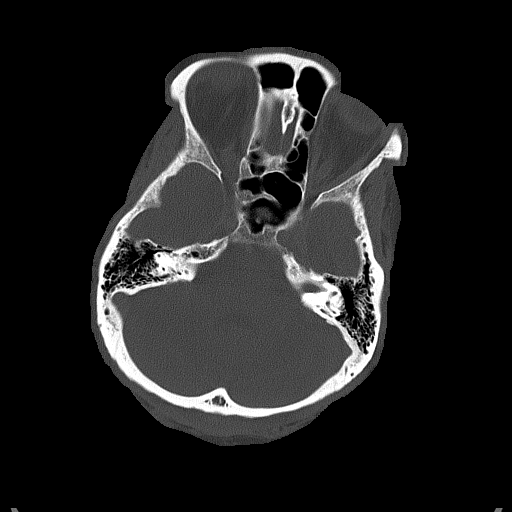
[im 29/91  bone]
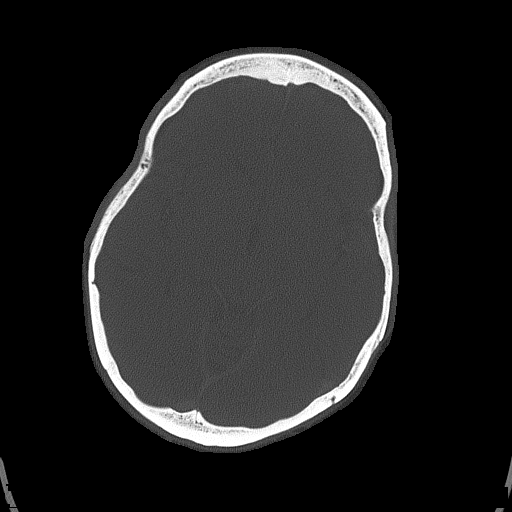

[Series 9: coronal soft tissue · coronal · 0.30mm/px · 3 of 65 slices shown]
[im 17/65  brain]
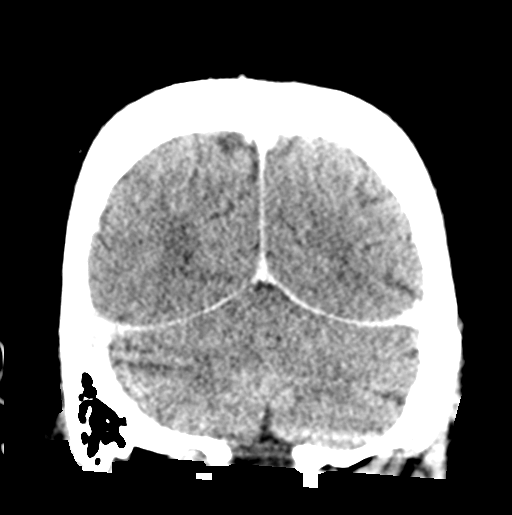
[im 33/65  brain]
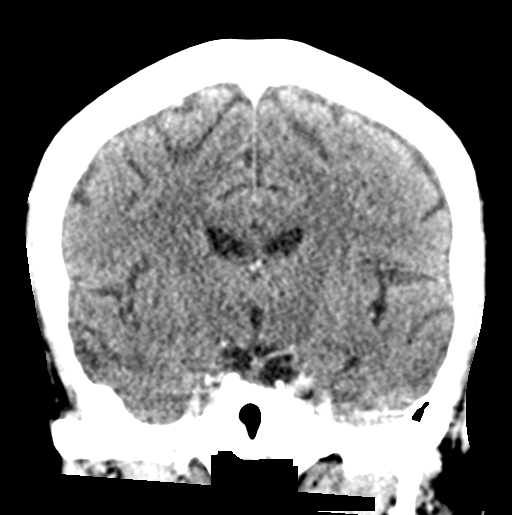
[im 49/65  brain]
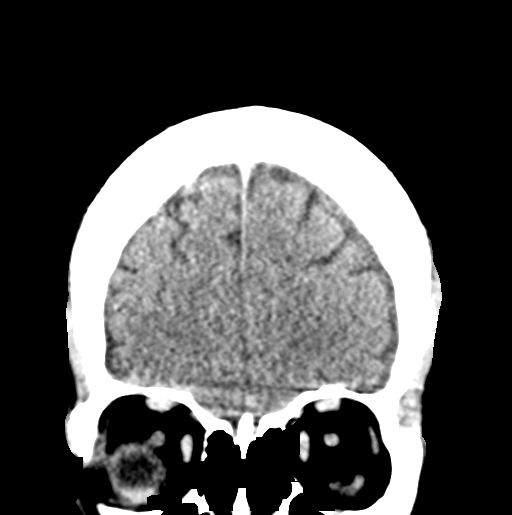

[Series 10: sagittal soft tissue · sagittal · 0.30mm/px · 2 of 52 slices shown]
[im 18/52  brain]
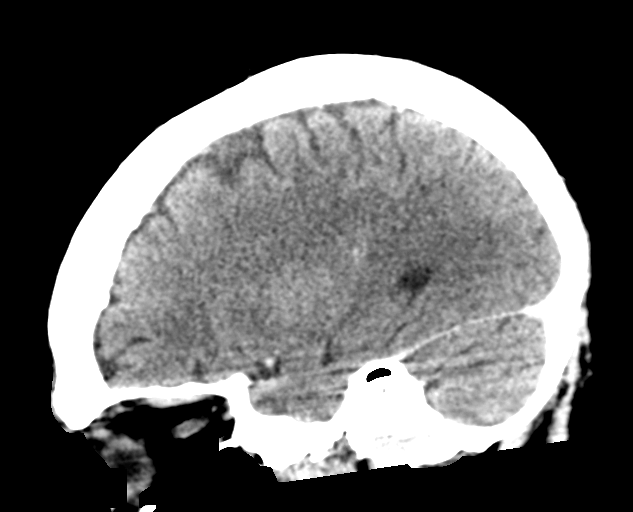
[im 35/52  brain]
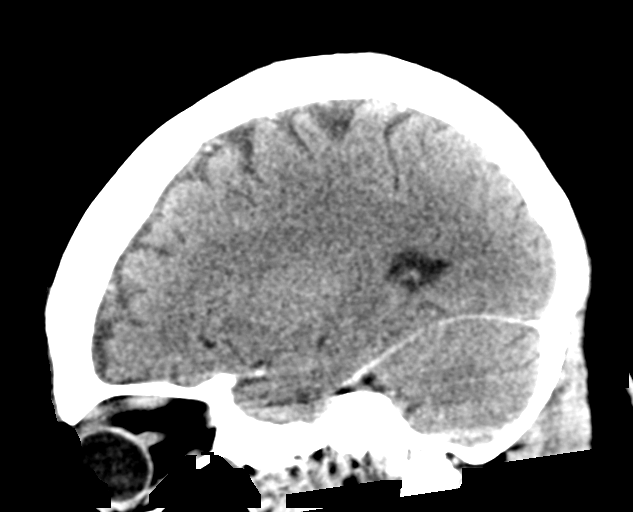

[17 of 47 positions shown; findings below may reference images not displayed]

FINDINGS: Brain:

Cerebral volume is normal for age.

There is no acute intracranial hemorrhage.

No demarcated cortical infarct.

No extra-axial fluid collection.

No evidence of an intracranial mass.

No midline shift.

No pathologic intracranial enhancement is identified.

Vascular: Atherosclerotic calcifications.

Skull: Normal. Negative for fracture or focal suspicious osseous
lesion.

Sinuses/Orbits: Visualized orbits show no acute finding. Minimal
frothy secretions within the left sphenoid sinus. Small right
sphenoid sinus mucous retention cyst. Minimal partial opacification
of the posterior right ethmoid air cells.
IMPRESSION: No evidence of acute intracranial abnormality.

No CT evidence of intracranial metastatic disease.

Mild paranasal sinus disease at the imaged levels, as described.

## 2020-11-21 IMAGING — CT CT CHEST-ABD-PELV W/ CM
2 of 5 series · 13 of 36 positions shown, 15 images · IV contrast (omnipaque)
Comparison: CT abdomen and pelvis [DATE].

CLINICAL DATA: Altered mental status. Recent history of
pneumococcal meningitis and bacteremia. Coffee ground emesis.

EXAM:
CT CHEST, ABDOMEN, AND PELVIS WITH CONTRAST
TECHNIQUE: Multidetector CT imaging of the chest, abdomen and pelvis was
performed following the standard protocol during bolus
administration of intravenous contrast.
CONTRAST:  100 mL OMNIPAQUE IOHEXOL 350 MG/ML SOLN

[Series 3: cap with · axial · 0.68mm/px · z∈[-478,+97]mm · 10 of 141 slices shown, 12 images]
[im 13/141  mediastinal]
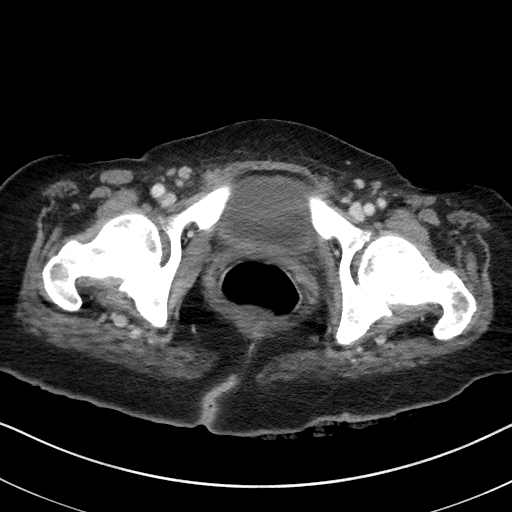
[im 13/141  bone]
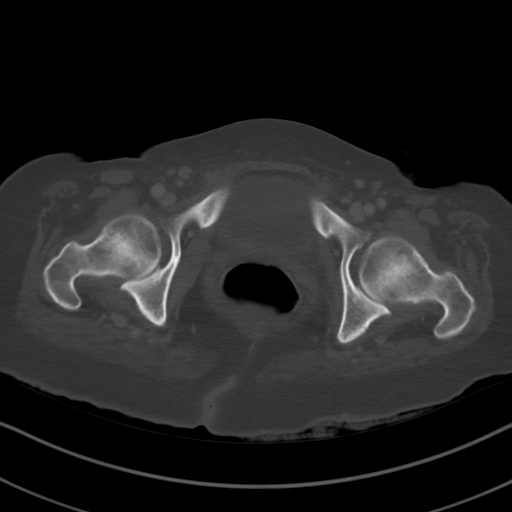
[im 26/141  mediastinal]
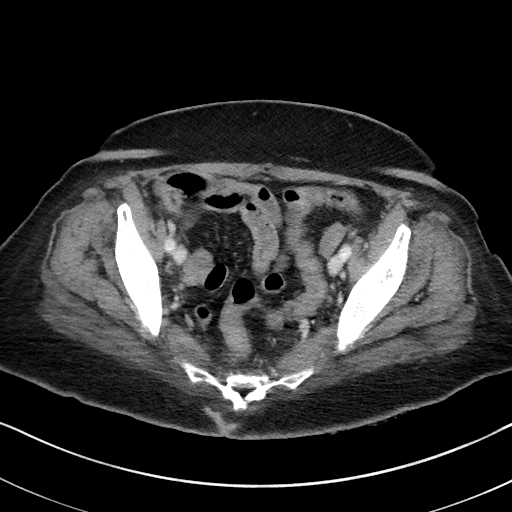
[im 39/141  mediastinal]
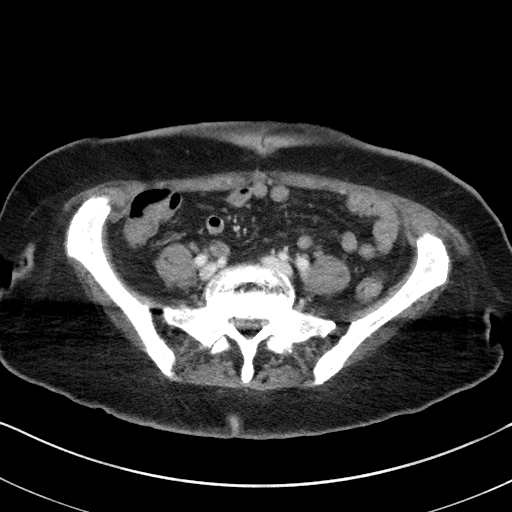
[im 51/141  mediastinal]
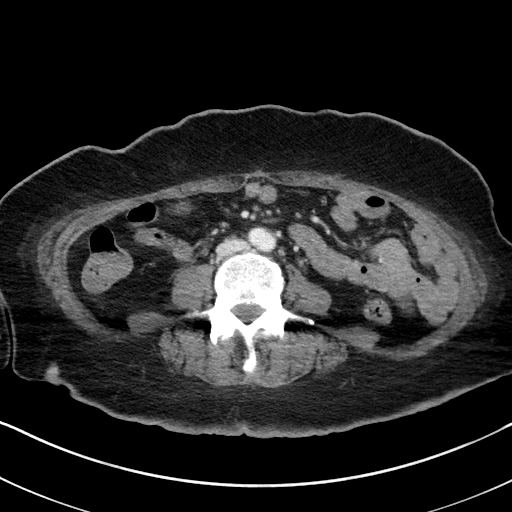
[im 64/141  mediastinal]
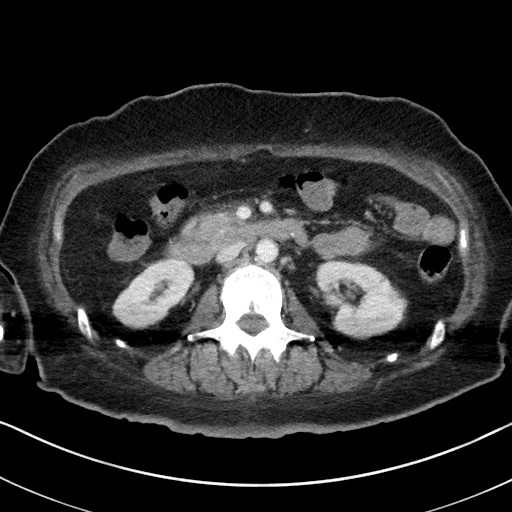
[im 77/141  mediastinal]
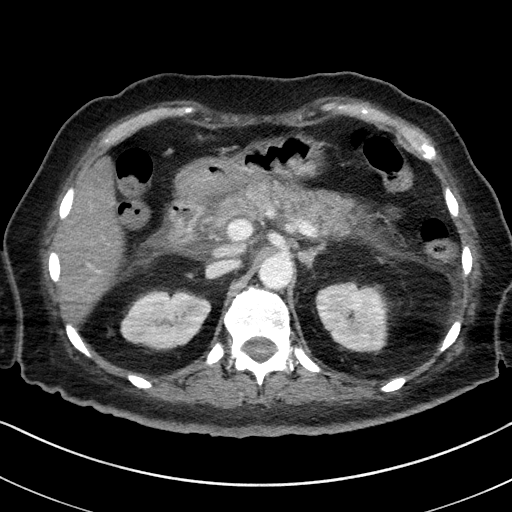
[im 90/141  mediastinal]
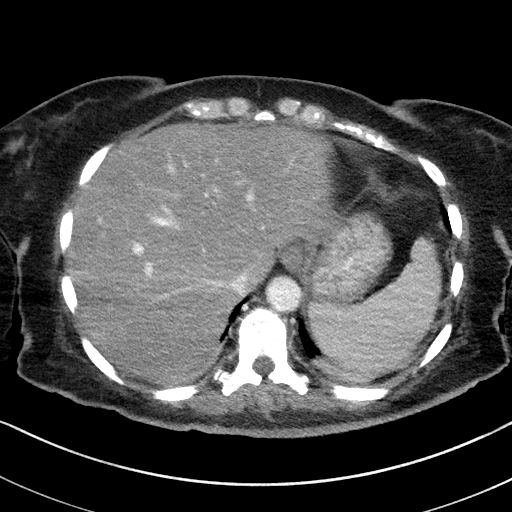
[im 102/141  mediastinal]
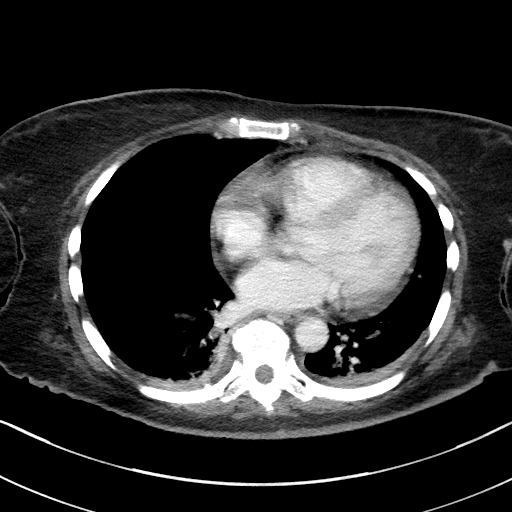
[im 115/141  mediastinal]
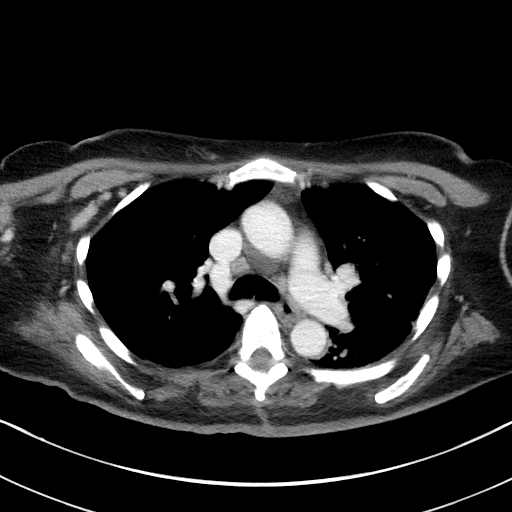
[im 115/141  bone]
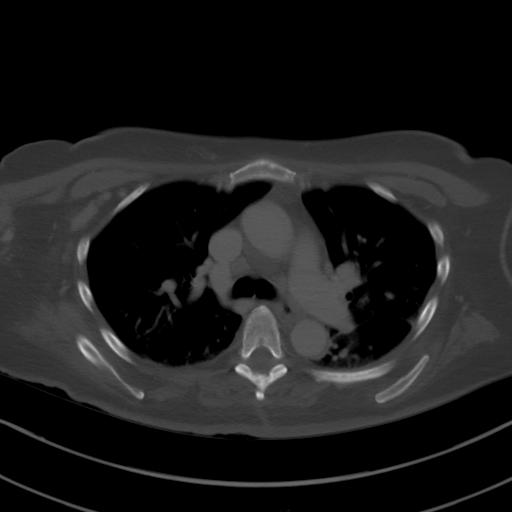
[im 128/141  mediastinal]
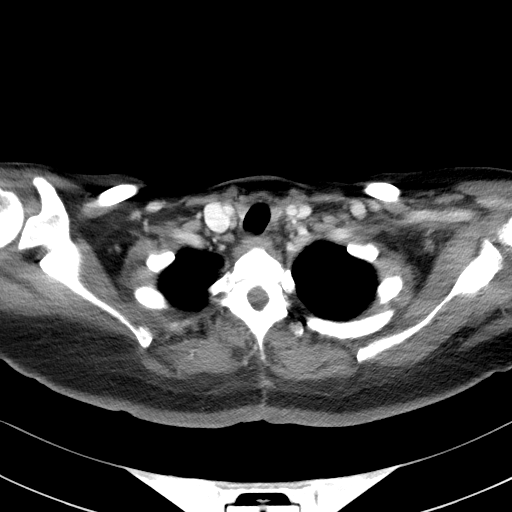

[Series 7: coronals · coronal · 0.74mm/px · 3 of 127 slices shown]
[im 26/127  mediastinal]
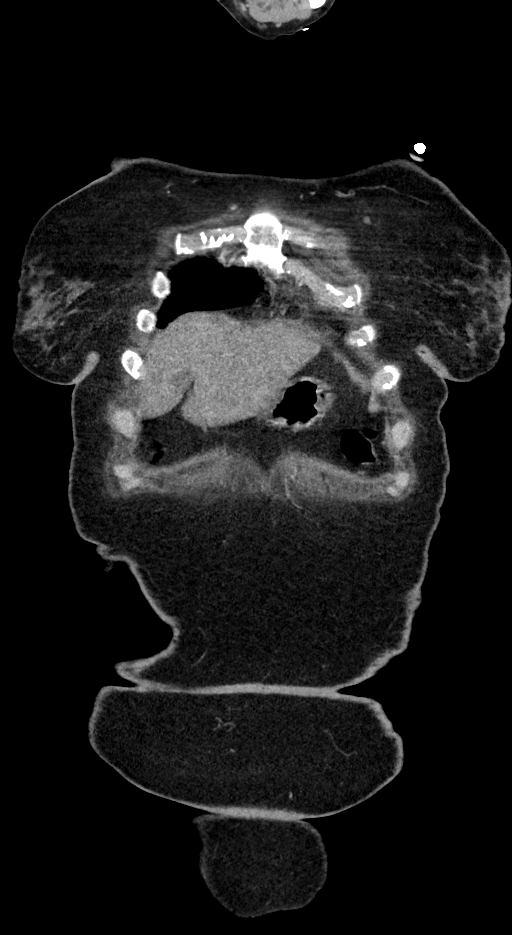
[im 51/127  mediastinal]
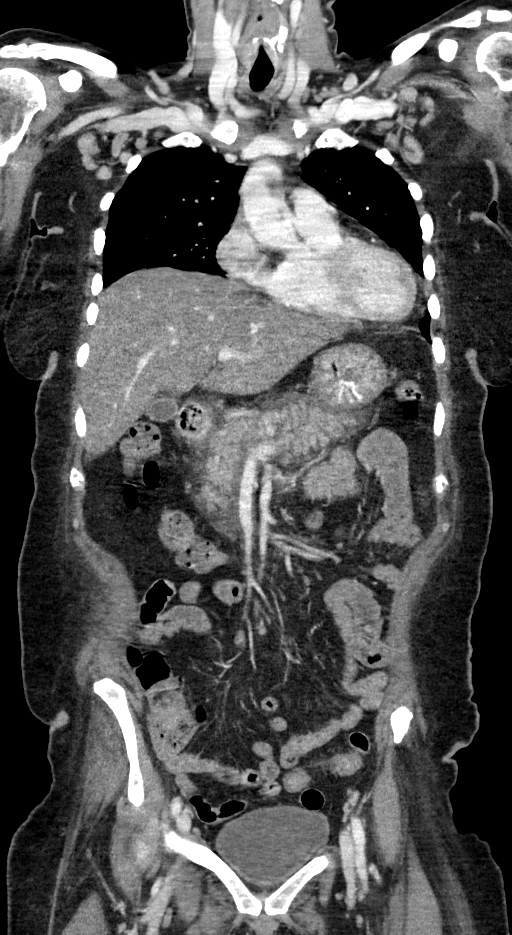
[im 76/127  mediastinal]
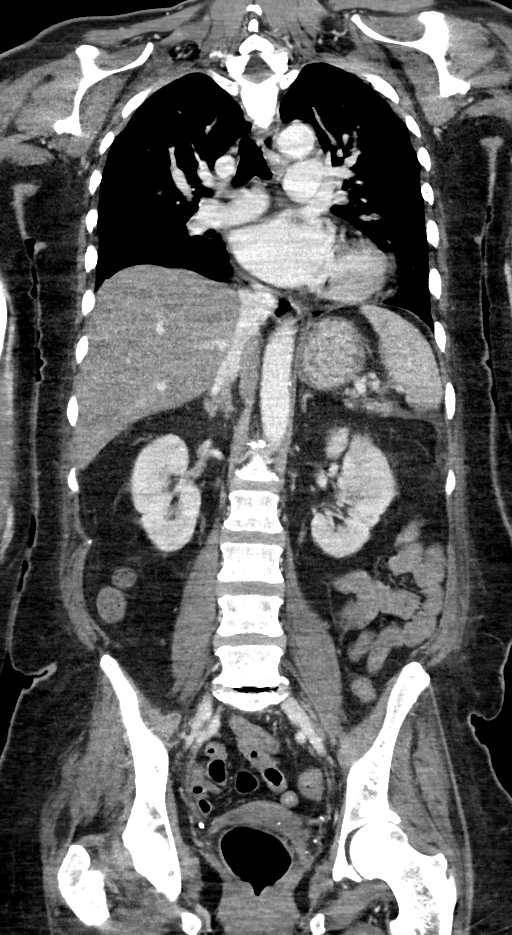

[13 of 36 positions shown; findings below may reference images not displayed]

FINDINGS: CT CHEST FINDINGS

Cardiovascular: There is mild cardiomegaly and a small pericardial
effusion. No atherosclerosis is seen.

Mediastinum/Nodes: Innumerable axillary and subpectoral lymph nodes
are new since the prior chest CT. Index node in the right axilla
measuring 1 cm short axis dimension is seen on image 21 of series 3.
A left subpectoral node measuring 1.3 cm is seen on image 24. Small
mediastinal lymph nodes include a precarinal node on image 27
measuring 1 cm. The esophagus and thyroid are negative.

Lungs/Pleura: There is some dependent atelectasis. A 0.7 x 0.5 cm
right upper lobe nodule on image 72 of series 6 measure
approximately 0.7 x 0.4 cm on the prior exam. There is extensive
micronodularity in the right upper lobe.

Musculoskeletal: No acute or focal abnormality

CT ABDOMEN PELVIS FINDINGS

Hepatobiliary: No focal liver abnormality is seen. No gallstones,
gallbladder wall thickening, or biliary dilatation. The liver is
diffusely low attenuating consistent with marked fatty infiltration.

Pancreas: Stranding is present about the pancreas throughout is
consistent with acute pancreatitis. No focal fluid collection. The
pancreas enhances homogeneously.

Spleen: Normal in size without focal abnormality.

Adrenals/Urinary Tract: The adrenal glands appear normal. A single
small cyst is seen in the left kidney. On delayed imaging, there are
areas of decreased attenuation in both kidneys which may be
artifactual. No hydronephrosis. No ureteral or urinary bladder
stone.

Stomach/Bowel: Stomach is within normal limits. Appendix appears
normal. No evidence of bowel wall thickening, distention, or
inflammatory changes.

Vascular/Lymphatic: No significant vascular findings are present. No
enlarged abdominal or pelvic lymph nodes.

Reproductive: Status post hysterectomy. No adnexal masses.

Other: None.

Musculoskeletal: No acute or focal bony abnormality.
IMPRESSION: The examination is positive for findings consistent with acute
pancreatitis without pseudocyst formation or pancreatic necrosis.

Micronodularity in the right upper lobe is worrisome for
bronchopneumonia.

0.7 x 0.5 cm right upper lobe nodule has mildly increased in size
since [7J]. Non-contrast chest CT at 6-12 months is recommended. If
the nodule is stable at time of repeat CT, then future CT at 18-24
months (from today's scan) is considered optional for low-risk
patients, but is recommended for high-risk patients. This
recommendation follows the consensus statement: Guidelines for
Management of Incidental Pulmonary Nodules Detected on CT Images:

Multiple axillary and subpectoral lymph nodes are abnormal in number
and likely related to this patient's history of CLL.

Cardiomegaly and small pericardial effusion.

Marked fatty infiltration of the liver.

Decreased cortical attenuation the kidneys is likely artifactual.
Correlation with urinalysis could be used to exclude pyelonephritis.

## 2020-11-21 MED ORDER — POTASSIUM CHLORIDE 10 MEQ/100ML IV SOLN
10.0000 meq | INTRAVENOUS | Status: AC
Start: 2020-11-21 — End: 2020-11-21
  Administered 2020-11-21 (×4): 10 meq via INTRAVENOUS
  Filled 2020-11-21 (×2): qty 100

## 2020-11-21 MED ORDER — LORAZEPAM 2 MG/ML IJ SOLN
1.0000 mg | Freq: Once | INTRAMUSCULAR | Status: AC
  Administered 2020-11-21: 1 mg via INTRAVENOUS
  Filled 2020-11-21: qty 1

## 2020-11-21 MED ORDER — POTASSIUM CHLORIDE CRYS ER 20 MEQ PO TBCR
40.0000 meq | EXTENDED_RELEASE_TABLET | ORAL | Status: AC
  Administered 2020-11-21 (×2): 40 meq via ORAL
  Filled 2020-11-21 (×2): qty 2

## 2020-11-21 MED ORDER — FLUCONAZOLE 50 MG PO TABS
150.0000 mg | ORAL_TABLET | Freq: Every day | ORAL | Status: DC
  Administered 2020-11-21 – 2020-11-24 (×4): 150 mg via ORAL
  Filled 2020-11-21 (×4): qty 1

## 2020-11-21 MED ORDER — INSULIN GLARGINE-YFGN 100 UNIT/ML ~~LOC~~ SOLN
30.0000 [IU] | Freq: Every day | SUBCUTANEOUS | Status: DC
  Administered 2020-11-21 – 2020-11-22 (×2): 30 [IU] via SUBCUTANEOUS
  Filled 2020-11-21 (×2): qty 0.3

## 2020-11-21 MED ORDER — NYSTATIN 100000 UNIT/ML MT SUSP
5.0000 mL | Freq: Four times a day (QID) | OROMUCOSAL | Status: AC
  Administered 2020-11-21 – 2020-11-27 (×24): 500000 [IU] via ORAL
  Filled 2020-11-21 (×20): qty 5

## 2020-11-21 MED ORDER — IOHEXOL 350 MG/ML SOLN
100.0000 mL | Freq: Once | INTRAVENOUS | Status: AC | PRN
  Administered 2020-11-21: 100 mL via INTRAVENOUS

## 2020-11-21 MED ORDER — DEXAMETHASONE SODIUM PHOSPHATE 4 MG/ML IJ SOLN
4.0000 mg | Freq: Four times a day (QID) | INTRAMUSCULAR | Status: DC
  Administered 2020-11-21 – 2020-11-22 (×4): 4 mg via INTRAVENOUS
  Filled 2020-11-21 (×3): qty 1

## 2020-11-21 NOTE — Progress Notes (Signed)
Inpatient Diabetes Program Recommendations  AACE/ADA: New Consensus Statement on Inpatient Glycemic Control (2015)  Target Ranges:  Prepandial:   less than 140 mg/dL      Peak postprandial:   less than 180 mg/dL (1-2 hours)      Critically ill patients:  140 - 180 mg/dL   Lab Results  Component Value Date   GLUCAP 217 (H) 11/21/2020   HGBA1C 6.5 (H) 10/02/2020   Diabetes history: DM2 Outpatient Diabetes medications: Soliqua 50 units QAM, Metformin 1000 mg daily Current orders for Inpatient glycemic control: Novolog 0-15 units q 4 hours, Decadron 10 mg Q6H   Inpatient Diabetes Program Recommendations:     Please consider increasing glargine-yfgn (Semglee) to 32 units daily.   Thanks,  Adah Perl, RN, BC-ADM Inpatient Diabetes Coordinator Pager [phone]  (8a-5p)

## 2020-11-21 NOTE — Consult Note (Signed)
[facility]  Telephone:([phone] Fax:(336) [phone]  ID: Kandy Garrison OB: April 02, 1966  MR#: [id number]  ERX#:[id number]  Patient Care Team: [name], MD as PCP - General (Family Medicine)  CHIEF COMPLAINT: Altered mental status/confusion, history of CLL, pancytopenia.  INTERVAL HISTORY: Patient is a 55 year old female with a history of CLL that has been in remission since 2016 but now follows up on a yearly basis at the cancer center.  Patient has a recent history of pneumococcal meningitis and bacteremia, and presented to the emergency room with altered mental status.  Patient's spouse reported that she had decreased p.o. intake and increased weakness and fatigue over the past week.  There is no reported fevers.  Currently, patient is confused and review of systems is difficult.  No family members at bedside.  REVIEW OF SYSTEMS:   Review of Systems  Unable to perform ROS: Mental status change    PAST MEDICAL HISTORY: Past Medical History:  Diagnosis Date   Acute hemorrhoid 03/11/2015   Anxiety    Arthritis    Asthma    Cancer ([place])    Chronic back pain    CLL (chronic lymphocytic leukemia) ([place])    Depression    Diabetes mellitus without complication ([place])    Genital herpes    type 2   Hypertension    Hypothyroidism    Vertigo     PAST SURGICAL HISTORY: Past Surgical History:  Procedure Laterality Date   ABDOMINAL HYSTERECTOMY     BILATERAL SALPINGOOPHORECTOMY  2009   BREAST BIOPSY Right 05/17/2016   FIBROADENOMATOUS CHANGE AND SCLEROSING ADENOSIS WITH   COLONOSCOPY WITH PROPOFOL N/A 06/16/2015   Procedure: COLONOSCOPY WITH PROPOFOL;  Surgeon: [name], MD;  [facility];  Service: Endoscopy;  Laterality: N/A;   LAPAROSCOPIC SUPRACERVICAL HYSTERECTOMY  2009   due to [place] Bilateral 06/17/2020   Procedure: THORACIC LAMINECTOMY FOR EPIDURAL ABSCESS;   Surgeon: Deetta Perla, MD;  Location: [facility] ORS;  Service: Neurosurgery;  Laterality: Bilateral;    FAMILY HISTORY: Family History  Problem Relation Age of Onset   Cancer Paternal Aunt    Breast cancer Maternal Aunt 81    ADVANCED DIRECTIVES (Y/N):  _0 @  HEALTH MAINTENANCE: Social History   Tobacco Use   Smoking status: Never   Smokeless tobacco: Never  Vaping Use   Vaping Use: Never used  Substance Use Topics   Alcohol use: Yes    Alcohol/week: 1.0 standard drink    Types: 1 Cans of beer per week   Drug use: No     Colonoscopy:  PAP:  Bone density:  Lipid panel:  Allergies  Allergen Reactions   Amoxapine    Penicillins     Current Facility-Administered Medications  Medication Dose Route Frequency Provider Last Rate Last Admin   acetaminophen (TYLENOL) tablet 650 mg  650 mg Oral Q6H PRN Cox, Amy N, DO       Or   acetaminophen (TYLENOL) suppository 650 mg  650 mg Rectal Q6H PRN Cox, Amy N, DO       acetaminophen (TYLENOL) tablet 1,000 mg  1,000 mg Oral Once Cox, Amy N, DO       acyclovir (ZOVIRAX) 830 mg in dextrose 5 % 150 mL IVPB  10 mg/kg Intravenous Q8H Rauer, Samantha O, RPH 166.6 mL/hr at 11/21/20 1444 830 mg at 11/21/20 1444   albuterol (PROVENTIL) (2.5 MG/3ML) 0.083% nebulizer solution 3 mL  3 mL  Inhalation Q6H PRN Cox, Amy N, DO       cefTRIAXone (ROCEPHIN) 2 g in sodium chloride 0.9 % 100 mL IVPB  2 g Intravenous Q12H Lu Duffel, RPH 200 mL/hr at 11/21/20 0855 2 g at 11/21/20 0855   dexamethasone (DECADRON) injection 4 mg  4 mg Intravenous Q6H Sreenath, Sudheer B, MD   4 mg at 11/21/20 1216   diltiazem (CARDIZEM CD) 24 hr capsule 120 mg  120 mg Oral Daily Cox, Amy N, DO   120 mg at 11/21/20 0848   fluconazole (DIFLUCAN) tablet 150 mg  150 mg Oral Daily Ralene Muskrat B, MD   150 mg at 11/21/20 0955   insulin aspart (novoLOG) injection 0-15 Units  0-15 Units Subcutaneous Q4H Ralene Muskrat B, MD   8 Units at 11/21/20 1216   insulin  glargine-yfgn (SEMGLEE) injection 30 Units  30 Units Subcutaneous Daily Ralene Muskrat B, MD   30 Units at 11/21/20 0955   lactated ringers infusion   Intravenous Continuous Rauer, Forde Dandy, RPH 125 mL/hr at 11/21/20 0958 New Bag at 11/21/20 5003   levothyroxine (SYNTHROID) tablet 100 mcg  100 mcg Oral QAC breakfast Ralene Muskrat B, MD   100 mcg at 11/21/20 0530   lidocaine (PF) (XYLOCAINE) 1 % injection 5 mL  5 mL Other Once Cox, Amy N, DO       LORazepam (ATIVAN) injection 1 mg  1 mg Intravenous PRN Cox, Amy N, DO       nystatin (MYCOSTATIN) 100000 UNIT/ML suspension 500,000 Units  5 mL Oral QID Ralene Muskrat B, MD   500,000 Units at 11/21/20 1441   ondansetron (ZOFRAN) tablet 4 mg  4 mg Oral Q6H PRN Cox, Amy N, DO       Or   ondansetron (ZOFRAN) injection 4 mg  4 mg Intravenous Q6H PRN Cox, Amy N, DO       pantoprazole (PROTONIX) EC tablet 40 mg  40 mg Oral Daily Cox, Amy N, DO   40 mg at 11/21/20 0847   rosuvastatin (CRESTOR) tablet 10 mg  10 mg Oral QHS Cox, Amy N, DO   10 mg at 11/20/20 2251    OBJECTIVE: Vitals:   11/21/20 0835 11/21/20 1212  BP: (!) 148/81 (!) 151/92  Pulse: 60 (!) 58  Resp: 16 17  Temp: 98.6 F (37 C) 98.1 F (36.7 C)  SpO2: 98% 95%     Body mass index is 24.82 kg/m.    ECOG FS:4 - Bedbound  General: Well-developed, well-nourished, no acute distress. Eyes: Pink conjunctiva, anicteric sclera. HEENT: Normocephalic, moist mucous membranes. Lungs: No audible wheezing or coughing. Heart: Regular rate and rhythm. Abdomen: Soft, nontender, no obvious distention. Musculoskeletal: No edema, cyanosis, or clubbing. Neuro: Alert, but confused. Cranial nerves grossly intact. Skin: No rashes or petechiae noted. Psych: Normal affect.  LAB RESULTS:  Lab Results  Component Value Date   NA 140 11/21/2020   K 2.5 (LL) 11/21/2020   CL 107 11/21/2020   CO2 28 11/21/2020   GLUCOSE 229 (H) 11/21/2020   BUN 8 11/21/2020   CREATININE 0.50 11/21/2020    CALCIUM 8.1 (L) 11/21/2020   PROT 6.8 11/21/2020   ALBUMIN 2.4 (L) 11/21/2020   AST 31 11/21/2020   ALT <5 11/21/2020   ALKPHOS 56 11/21/2020   BILITOT 0.5 11/21/2020   GFRNONAA >60 11/21/2020   GFRAA >60 01/17/2020    Lab Results  Component Value Date   WBC 3.6 (L) 11/21/2020   NEUTROABS 2.5  11/21/2020   HGB 8.1 (L) 11/21/2020   HCT 25.6 (L) 11/21/2020   MCV 83.1 11/21/2020   PLT 72 (L) 11/21/2020     STUDIES: CT Head Wo Contrast  Result Date: 11/19/2020 CLINICAL DATA:  Mental status changes of unknown cause. EXAM: CT HEAD WITHOUT CONTRAST TECHNIQUE: Contiguous axial images were obtained from the base of the skull through the vertex without intravenous contrast. COMPARISON:  10/02/2020 FINDINGS: Brain: The brain shows a normal appearance without evidence of malformation, atrophy, old or acute small or large vessel infarction, mass lesion, hemorrhage, hydrocephalus or extra-axial collection. Vascular: No hyperdense vessel. No evidence of atherosclerotic calcification. Skull: Normal.  No traumatic finding.  No focal bone lesion. Sinuses/Orbits: Sinuses are clear. Orbits appear normal. Mastoids are clear. Other: None significant IMPRESSION: Normal head CT. Electronically Signed   By: Nelson Chimes M.D.   On: 11/19/2020 16:19   CT HEAD W & WO CONTRAST  Result Date: 11/21/2020 CLINICAL DATA:  Metastatic disease evaluation. Additional provided: Patient with history of recent pneumococcal meningitis and bacteremia, diabetes mellitus presenting with altered mental status. EXAM: CT HEAD WITHOUT AND WITH CONTRAST TECHNIQUE: Contiguous axial images were obtained from the base of the skull through the vertex without and with intravenous contrast CONTRAST:  151m OMNIPAQUE IOHEXOL 350 MG/ML SOLN COMPARISON:  Prior head CT examinations 11/19/2020 and earlier. FINDINGS: Brain: Cerebral volume is normal for age. There is no acute intracranial hemorrhage. No demarcated cortical infarct. No extra-axial  fluid collection. No evidence of an intracranial mass. No midline shift. No pathologic intracranial enhancement is identified. Vascular: Atherosclerotic calcifications. Skull: Normal. Negative for fracture or focal suspicious osseous lesion. Sinuses/Orbits: Visualized orbits show no acute finding. Minimal frothy secretions within the left sphenoid sinus. Small right sphenoid sinus mucous retention cyst. Minimal partial opacification of the posterior right ethmoid air cells. IMPRESSION: No evidence of acute intracranial abnormality. No CT evidence of intracranial metastatic disease. Mild paranasal sinus disease at the imaged levels, as described. Electronically Signed   By: KKellie SimmeringDO   On: 11/21/2020 15:42   [place]PPER EXTREMITY DUPLEX BILATERAL (NON-WBI)  Result Date: 11/05/2020 CLINICAL DATA:  Numbness, tingling and coldness in the upper extremities EXAM: BILATERAL UPPER EXTREMITY ARTERIAL DUPLEX SCAN TECHNIQUE: Gray-scale sonography as well as color Doppler and duplex ultrasound was performed to evaluate the arteries of both upper extremities. COMPARISON:  None. FINDINGS: Right upper extremity: Normal triphasic arterial waveforms throughout the subclavian, axillary, brachial, radial and ulnar artery. No focal atherosclerotic plaque. No evidence of stenosis or occlusion. Left upper extremity: Normal triphasic arterial waveforms throughout the subclavian, axillary, brachial, radial and ulnar artery. No focal atherosclerotic plaque. No evidence of stenosis or occlusion. IMPRESSION: 1. No evidence of hemodynamically significant stenosis or occlusion in either upper extremity arterial tree. 2. Incidentally and incompletely imaged prominent lymph nodes in the axillary spaces bilaterally. These are of uncertain significance and may be reactive in nature. If the patient has palpable lymphadenopathy, or other signs and symptoms suspicious for a lymphoproliferative process, then further imaging (contrast-enhanced CT  scan of the chest) may be warranted. Electronically Signed   By: HJacqulynn CadetM.D.   On: 11/05/2020 15:46   DG Chest Portable 1 View  Result Date: 11/19/2020 CLINICAL DATA:  Cough, fever EXAM: PORTABLE CHEST 1 VIEW COMPARISON:  10/01/2020 FINDINGS: Heart size is upper limits of normal. Low lung volumes. Mildly increased interstitial markings bilaterally. No lobar consolidation. No pleural effusion or pneumothorax. IMPRESSION: Low lung volumes with mildly increased interstitial markings bilaterally  may reflect mild edema or atypical/viral infection. Electronically Signed   By: Davina Poke D.O.   On: 11/19/2020 16:22   DG FL GUIDED LUMBAR PUNCTURE  Result Date: 11/20/2020 CLINICAL DATA:  Altered mental status EXAM: DIAGNOSTIC LUMBAR PUNCTURE UNDER FLUOROSCOPIC GUIDANCE COMPARISON:  None FLUOROSCOPY TIME:  Fluoroscopy Time:  12 seconds Radiation Exposure Index (if provided by the fluoroscopic device): 1.6 mGy PROCEDURE: Informed consent was obtained from the patient's husband prior to the procedure, including potential complications of headache, allergy, and pain. With the patient prone, the lower back was prepped with Betadine. 1% Lidocaine was used for local anesthesia. Lumbar puncture was performed at the L4-L5 level using a 20 gauge needle with return of clear CSF. 8 ml of CSF were obtained for laboratory studies. The patient tolerated the procedure well and there were no apparent complications. IMPRESSION: Technically successful fluoroscopic guided lumbar puncture. Electronically Signed   By: Macy Mis M.D.   On: 11/20/2020 11:42    ASSESSMENT: Altered mental status/confusion, history of CLL, pancytopenia.  PLAN:    1.  Acute encephalopathy/altered mental status/confusion: Unclear etiology.  Infectious work-up appears to be negative with no growth in blood, cerebrospinal fluid, or urine cultures. Highly unlikely related to underlying CLL given unimpressive lumbar fluid with negative  cytology as well as negative head CT done earlier today.  Appreciate ID input with aseptic or viral meningitis in the differential. 2.  Pancytopenia: Unlikely related to progressive CLL.  Patient essentially had a normal CBC in June 2022.  Does not appear to be medication induced.  May be related to acute illness.  Bone marrow biopsy is not necessary at this time, but if there is no improvement along with patient's clinical status would consider one in the future.  B12, folate, iron stores ordered for completeness. 3.  CLL: Patient has been in remission since 2016.  CT of chest, abdomen, and pelvis are pending at time of dictation.  Appreciate consult, will follow.   Lloyd Huger, MD   11/21/2020 4:08 PM

## 2020-11-21 NOTE — Progress Notes (Signed)
PROGRESS NOTE    Dannika Hilgeman  DEY:[id number] DOB: [birth year] DOA: 11/19/2020 PCP: [name], MD   Brief Narrative:  55 y.o. female with medical history significant for recent pneumococcal meningitis and bacteremia, depression, hypothyroid, IDDM2, presents to the ED for chief concern of altered mentation.   At bedside, she had expressive aphasia, right upper and lower extremity weakness. Confusion today per spouse. Can walk with a walker and assistance. Jumbled words. She has baseline weakness in the right arm compared to left arm.    Spouse reports that patient had poor PO intake for more than a week. Spouse endorses vomiting and dnies blood and black coffee ground substances.   Patient has been unable to tolerate the MRI due to severe claustrophobia.  Will attempt again with help of IV Ativan.  Lumbar puncture was performed, initial results not terribly impressive.  Not overtly concerning for meningitis.  Remains on broad-spectrum IV antibiotics and IV acyclovir.  Infectious disease consulted.  Concern for possible manifestation of underlying hematologic malignancy.  I have requested consultation from oncology.  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  She sees oncology only once yearly.   Assessment & Plan:   Principal Problem:   Altered mental status Active Problems:   DDD (degenerative disc disease), cervical   Aphasia   Anemia of chronic disease   Acute focal neurological deficit   Encephalitis   Encephalopathy acute  Acute encephalopathy Unclear etiology Differentials include meningitis, viral versus bacterial versus aseptic Also CVA, hematologic malignancy, brain abscess Infectious disease on consult LP not indicative of bacterial meningitis Consideration for recurrence of underlying hematologic malignancy Unfortunately patient unable to tolerate MRI Plan: Continue Rocephin and acyclovir for now Continue steroids, dose decreased to 4  mg every 6 hours Monitor cultures, no growth to date Monitor vitals and fever curve Monitor mental status We will pursue CT head with contrast Will pursue CT chest abdomen pelvis with contrast Appreciate ID and oncology evaluation  Pancytopenia History of CLL All 3 cell lines acutely depressed Oncology consulted Patient's CLL has been in remission since 2016 Possible underlying hematologic malignancy Plan: Daily CBC with differential CT head chest abdomen pelvis with IV contrast Appreciate oncology consultation and evaluation  Marked hypokalemia Suspect secondary to vomiting and poor p.o. intake Improved after replacement Check daily labs  Oral thrush Patient with clinical indications of thrush Associated with difficulty swallowing Plan: Diflucan 150 mg p.o. daily Nystatin swish and swallow 4 times daily  Insulin-dependent diabetes mellitus Patient is on long-acting formulation 50 units daily at home in addition to metformin This is on hold Plan: Glargine 30 units daily Sliding scale coverage Carb modified diet Diabetes coordinator consult  Hyperlipidemia PTA statin  Hypothyroid PTA Synthroid   DVT prophylaxis: TED hose Code Status: Full code Family Communication: [name] [phone] on 7/29 Disposition Plan: Status is: Inpatient  Remains inpatient appropriate because:Inpatient level of care appropriate due to severity of illness  Dispo: The patient is from: Home              Anticipated d/c is to: Home              Patient currently is not medically stable to d/c.   Difficult to place patient No  Acute encephalopathy, progressive unclear etiology.  Possible manifestation of underlying hematologic malignancy.  Pursuing pan scan for further clarification.  Remains on antibiotics, steroids, IV fluids.     Level of care: Med-Surg  Consultants:  ID  Oncology  Procedures:  Lumbar puncture 7/28  Antimicrobials:   Ceftriaxone Acyclovir   Subjective: Patient seen and examined.  More awake this morning.  Still somewhat confused.  No visible distress.  Answers questions appropriately but unable to provide clear history.  Objective: Vitals:   11/20/20 1948 11/21/20 0052 11/21/20 0353 11/21/20 0835  BP: (!) 147/89 (!) 153/92 (!) 147/82 (!) 148/81  Pulse: 72 69 66 60  Resp: 18 18 18 16   Temp: 98.8 F (37.1 C) 98.3 F (36.8 C) 97.8 F (36.6 C) 98.6 F (37 C)  TempSrc:      SpO2: 100% 99% 97% 98%  Weight:      Height:        Intake/Output Summary (Last 24 hours) at 11/21/2020 1115 Last data filed at 11/21/2020 0955 Gross per 24 hour  Intake 1540.71 ml  Output 500 ml  Net 1040.71 ml   Filed Weights   11/20/20 0507  Weight: 83 kg    Examination:  General exam: Resting comfortably in bed.  No visible distress.  Confused Respiratory system: Lungs clear.  Normal work of breathing.  Room air Cardiovascular system: S1-S2, regular rate and rhythm, no murmurs, no pedal edema  gastrointestinal system: Soft, nontender, nondistended, normal bowel sounds Central nervous system: Alert, oriented x1, confused, no focal deficits Extremities: Decreased power symmetrically Skin: No obvious rashes or lesions Psychiatry: Judgement and insight appear impaired. Mood & affect flattened.     Data Reviewed: I have personally reviewed following labs and imaging studies  CBC: Recent Labs  Lab 11/19/20 1648 11/20/20 0955 11/21/20 0525  WBC 2.4* 2.3* 3.6*  NEUTROABS  --  1.4* 2.5  HGB 9.2* 8.8* 8.1*  HCT 29.4* 27.9* 25.6*  MCV 83.1 84.5 83.1  PLT 72* 63* 72*   Basic Metabolic Panel: Recent Labs  Lab 11/19/20 1648 11/20/20 0602 11/20/20 0737 11/21/20 0525  NA 141  --  139 140  K 2.8*  --  3.4* 2.5*  CL 105  --  104 107  CO2 25  --  23 28  GLUCOSE 157*  --  266* 229*  BUN 9  --  11 8  CREATININE 0.75  --  0.59 0.50  CALCIUM 8.3*  --  7.6* 8.1*  MG  --  1.6*  --   --    GFR: Estimated  Creatinine Clearance: 90.6 mL/min (by C-G formula based on SCr of 0.5 mg/dL). Liver Function Tests: Recent Labs  Lab 11/19/20 1648 11/20/20 0737 11/21/20 0525  AST 54* 42* 31  ALT 5 <5 <5  ALKPHOS 68 59 56  BILITOT 1.3* 0.9 0.5  PROT 7.1 6.4* 6.8  ALBUMIN 2.7* 2.3* 2.4*   No results for input(s): LIPASE, AMYLASE in the last 168 hours. Recent Labs  Lab 11/20/20 0545  AMMONIA 17   Coagulation Profile: Recent Labs  Lab 11/20/20 0602  INR 1.0   Cardiac Enzymes: No results for input(s): CKTOTAL, CKMB, CKMBINDEX, TROPONINI in the last 168 hours. BNP (last 3 results) No results for input(s): PROBNP in the last 8760 hours. HbA1C: No results for input(s): HGBA1C in the last 72 hours. CBG: Recent Labs  Lab 11/20/20 1552 11/20/20 1945 11/21/20 0054 11/21/20 0356 11/21/20 0834  GLUCAP 319* 326* 270* 222* 217*   Lipid Profile: No results for input(s): CHOL, HDL, LDLCALC, TRIG, CHOLHDL, LDLDIRECT in the last 72 hours. Thyroid Function Tests: Recent Labs    11/20/20 0545  TSH 0.120*   Anemia Panel: No results for input(s): VITAMINB12, FOLATE, FERRITIN,  TIBC, IRON, RETICCTPCT in the last 72 hours. Sepsis Labs: Recent Labs  Lab 11/19/20 1648 11/19/20 1846 11/20/20 0737  PROCALCITON  --   --  0.41  LATICACIDVEN 2.0* 1.9  --     Recent Results (from the past 240 hour(s))  Blood culture (routine x 2)     Status: None (Preliminary result)   Collection Time: 11/19/20  4:47 PM   Specimen: BLOOD  Result Value Ref Range Status   Specimen Description BLOOD RIGHT ANTECUBITAL  Final   Special Requests   Final    BOTTLES DRAWN AEROBIC AND ANAEROBIC Blood Culture adequate volume   Culture   Final    NO GROWTH < 24 HOURS Performed at [facility], [address], [place], [place] [address]    Report Status PENDING  Incomplete  Resp Panel by RT-PCR (Flu A&B, Covid) Nasopharyngeal Swab     Status: None   Collection Time: 11/19/20  4:48 PM   Specimen:  Nasopharyngeal Swab; Nasopharyngeal(NP) swabs in vial transport medium  Result Value Ref Range Status   SARS Coronavirus 2 by RT PCR NEGATIVE NEGATIVE Final    Comment: (NOTE) SARS-CoV-2 target nucleic acids are NOT DETECTED.  The SARS-CoV-2 RNA is generally detectable in upper respiratory specimens during the acute phase of infection. The lowest concentration of SARS-CoV-2 viral copies this assay can detect is 138 copies/mL. A negative result does not preclude SARS-Cov-2 infection and should not be used as the sole basis for treatment or other patient management decisions. A negative result may occur with  improper specimen collection/handling, submission of specimen other than nasopharyngeal swab, presence of viral mutation(s) within the areas targeted by this assay, and inadequate number of viral copies(<138 copies/mL). A negative result must be combined with clinical observations, patient history, and epidemiological information. The expected result is Negative.  Fact Sheet for Patients:  EntrepreneurPulse.com.au  Fact Sheet for Healthcare Providers:  IncredibleEmployment.be  This test is no t yet approved or cleared by the [place] FDA and  has been authorized for detection and/or diagnosis of SARS-CoV-2 by FDA under an Emergency Use Authorization (EUA). This EUA will remain  in effect (meaning this test can be used) for the duration of the COVID-19 declaration under Section 564(b)(1) of the Act, 21 U.S.C.section 360bbb-3(b)(1), unless the authorization is terminated  or revoked sooner.       Influenza A by PCR NEGATIVE NEGATIVE Final   Influenza B by PCR NEGATIVE NEGATIVE Final    Comment: (NOTE) The Xpert Xpress SARS-CoV-2/FLU/RSV plus assay is intended as an aid in the diagnosis of influenza from Nasopharyngeal swab specimens and should not be used as a sole basis for treatment. Nasal washings and aspirates are unacceptable for  Xpert Xpress SARS-CoV-2/FLU/RSV testing.  Fact Sheet for Patients: EntrepreneurPulse.com.au  Fact Sheet for Healthcare Providers: IncredibleEmployment.be  This test is not yet approved or cleared by the [place] FDA and has been authorized for detection and/or diagnosis of SARS-CoV-2 by FDA under an Emergency Use Authorization (EUA). This EUA will remain in effect (meaning this test can be used) for the duration of the COVID-19 declaration under Section 564(b)(1) of the Act, 21 U.S.C. section 360bbb-3(b)(1), unless the authorization is terminated or revoked.  Performed at [facility], [address] [place], [place] [address]   Blood culture (routine x 2)     Status: None (Preliminary result)   Collection Time: 11/19/20  6:46 PM   Specimen: BLOOD  Result Value Ref Range Status  Specimen Description BLOOD RIGHT ANTECUBITAL  Final   Special Requests   Final    BOTTLES DRAWN AEROBIC AND ANAEROBIC Blood Culture adequate volume   Culture   Final    NO GROWTH < 12 HOURS Performed at [facility], [address], [place], [place] [address]    Report Status PENDING  Incomplete  Urine Culture     Status: Abnormal   Collection Time: 11/19/20  9:33 PM   Specimen: Urine, Clean Catch  Result Value Ref Range Status   Specimen Description   Final    URINE, CLEAN CATCH Performed at [facility], [address], [place], [place] [address]    Special Requests   Final    NONE Performed at [facility], [address], [place], [place] [address]    Culture (A)  Final    <10,000 COLONIES/mL INSIGNIFICANT GROWTH Performed at [facility], [place] [address], [place], [place] [address]    Report Status 11/21/2020 FINAL  Final  Culture, fungus without smear     Status: None (Preliminary result)   Collection Time: 11/20/20 11:09 AM   Specimen: CSF; Cerebrospinal Fluid  Result Value Ref Range Status    Specimen Description   Final    CSF Performed at [facility], [address] [place], [place] [address]    Special Requests   Final    NONE Performed at [facility], [address], [place], [place] [address]    Culture   Final    NO GROWTH < 24 HOURS Performed at [facility], [place] [address], [place], [name] [address]    Report Status PENDING  Incomplete  CSF culture w Gram Stain     Status: None (Preliminary result)   Collection Time: 11/20/20 11:09 AM   Specimen: PATH Cytology CSF; Cerebrospinal Fluid  Result Value Ref Range Status   Specimen Description   Final    CSF Performed at [facility], [address], [place], [place] [address]    Special Requests   Final    NONE Performed at [facility], [address] [place], [place] [address]    Gram Stain   Final    NO ORGANISMS SEEN WBC SEEN RED BLOOD CELLS PRESENT Performed at [facility], [address], [place], [place] [address]    Culture   Final    ANAEROBE ONLY at ID prompt in WORKUP Performed at [facility], [place] [address], [place],  [address]    Report Status PENDING  Incomplete         Radiology Studies: CT Head Wo Contrast  Result Date: 11/19/2020 CLINICAL DATA:  Mental status changes of unknown cause. EXAM: CT HEAD WITHOUT CONTRAST TECHNIQUE: Contiguous axial images were obtained from the base of the skull through the vertex without intravenous contrast. COMPARISON:  10/02/2020 FINDINGS: Brain: The brain shows a normal appearance without evidence of malformation, atrophy, old or acute small or large vessel infarction, mass lesion, hemorrhage, hydrocephalus or extra-axial collection. Vascular: No hyperdense vessel. No evidence of atherosclerotic calcification. Skull: Normal.  No traumatic finding.  No focal bone lesion. Sinuses/Orbits: Sinuses are clear. Orbits appear normal. Mastoids are clear. Other: None significant IMPRESSION:  Normal head CT. Electronically Signed   By: Nelson Chimes M.D.   On: 11/19/2020 16:19   DG Chest Portable 1 View  Result Date: 11/19/2020 CLINICAL DATA:  Cough, fever EXAM: PORTABLE CHEST 1 VIEW COMPARISON:  10/01/2020 FINDINGS:  Heart size is upper limits of normal. Low lung volumes. Mildly increased interstitial markings bilaterally. No lobar consolidation. No pleural effusion or pneumothorax. IMPRESSION: Low lung volumes with mildly increased interstitial markings bilaterally may reflect mild edema or atypical/viral infection. Electronically Signed   By: Davina Poke D.O.   On: 11/19/2020 16:22   DG FL GUIDED LUMBAR PUNCTURE  Result Date: 11/20/2020 CLINICAL DATA:  Altered mental status EXAM: DIAGNOSTIC LUMBAR PUNCTURE UNDER FLUOROSCOPIC GUIDANCE COMPARISON:  None FLUOROSCOPY TIME:  Fluoroscopy Time:  12 seconds Radiation Exposure Index (if provided by the fluoroscopic device): 1.6 mGy PROCEDURE: Informed consent was obtained from the patient's husband prior to the procedure, including potential complications of headache, allergy, and pain. With the patient prone, the lower back was prepped with Betadine. 1% Lidocaine was used for local anesthesia. Lumbar puncture was performed at the L4-L5 level using a 20 gauge needle with return of clear CSF. 8 ml of CSF were obtained for laboratory studies. The patient tolerated the procedure well and there were no apparent complications. IMPRESSION: Technically successful fluoroscopic guided lumbar puncture. Electronically Signed   By: Macy Mis M.D.   On: 11/20/2020 11:42        Scheduled Meds:  acetaminophen  1,000 mg Oral Once   dexamethasone (DECADRON) injection  10 mg Intravenous Q6H   diltiazem  120 mg Oral Daily   fluconazole  150 mg Oral Daily   insulin aspart  0-15 Units Subcutaneous Q4H   insulin glargine-yfgn  30 Units Subcutaneous Daily   levothyroxine  100 mcg Oral QAC breakfast   lidocaine (PF)  5 mL Other Once   nystatin  5 mL  Oral QID   pantoprazole  40 mg Oral Daily   rosuvastatin  10 mg Oral QHS   Continuous Infusions:  acyclovir 830 mg (11/21/20 0534)   cefTRIAXone (ROCEPHIN)  IV 2 g (11/21/20 0855)   lactated ringers 125 mL/hr at 11/21/20 0958   potassium chloride 10 mEq (11/21/20 1110)     LOS: 1 day    Time spent: 25 minutes    Sidney Ace, MD [facility] Pager [phone]  If 7PM-7AM, please contact night-coverage 11/21/2020, 11:15 AM

## 2020-11-21 NOTE — Progress Notes (Signed)
Pt was unable to tolerate MRI despite being given ordered dose of ativan before going down.

## 2020-11-21 NOTE — Consult Note (Signed)
Pharmacy Antibiotic Note  Tonya Myers is a 55 y.o. 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 Pt has a recent history for pneumococcal meningitis and bacteremia (05/2020). Pharmacy has been consulted for Vancomycin/Acyclovir dosing.  - renal function stable - CT head 7/29 - no abnormality, no evidence of metastatic dz - Cryptococcus Ag neg  Plan: Continue Ceftriaxone 2g q12h Continue Acyclovir to 830 mg (10mg /kg)  q8h (used actual body weight since BMI not >30) and start LR @125ml /hr to help prevent renal toxicity.  Monitor renal function and adjust doses as clinically indicated Await HSV and/or meningitis/encephalitis panel to return    Height: 6' (182.9 cm) Weight: 83 kg (182 lb 15.7 oz) IBW/kg (Calculated) : 73.1  Temp (24hrs), Avg:98.4 F (36.9 C), Min:97.8 F (36.6 C), Max:98.9 F (37.2 C)  Recent Labs  Lab 11/19/20 1648 11/19/20 1846 11/20/20 0737 11/20/20 0955 11/21/20 0525  WBC 2.4*  --   --  2.3* 3.6*  CREATININE 0.75  --  0.59  --  0.50  LATICACIDVEN 2.0* 1.9  --   --   --      Estimated Creatinine Clearance: 90.6 mL/min (by C-G formula based on SCr of 0.5 mg/dL).    Allergies  Allergen Reactions   Amoxapine    Penicillins     Antimicrobials this admission: Cefepime x 1 7/27 Ceftriaxone 7/28 >> Vancomycin 7/28 >> 7/28 Acyclovir 7/28 >>  Dose adjustments this admission: 7/28 acyclovir  Microbiology results: 7/27 BCx: NG <24h 7/27 UCx: <10K  7/28 CSF: NG COVID/FLU NEG  Thank you for allowing pharmacy to be a part of this patient's care.  Doreene Eland, PharmD, BCPS.   Work Cell: [phone] 11/21/2020 4:03 PM

## 2020-11-22 DIAGNOSIS — R4701 Aphasia: Secondary | ICD-10-CM | POA: Diagnosis not present

## 2020-11-22 LAB — CBC WITH DIFFERENTIAL/PLATELET
Abs Immature Granulocytes: 0.06 10*3/uL (ref 0.00–0.07)
Basophils Absolute: 0 10*3/uL (ref 0.0–0.1)
Basophils Relative: 0 %
Eosinophils Absolute: 0 10*3/uL (ref 0.0–0.5)
Eosinophils Relative: 0 %
HCT: 26.8 % — ABNORMAL LOW (ref 36.0–46.0)
Hemoglobin: 8.7 g/dL — ABNORMAL LOW (ref 12.0–15.0)
Immature Granulocytes: 1 %
Lymphocytes Relative: 16 %
Lymphs Abs: 1.3 10*3/uL (ref 0.7–4.0)
MCH: 26.7 pg (ref 26.0–34.0)
MCHC: 32.5 g/dL (ref 30.0–36.0)
MCV: 82.2 fL (ref 80.0–100.0)
Monocytes Absolute: 0.4 10*3/uL (ref 0.1–1.0)
Monocytes Relative: 5 %
Neutro Abs: 6.2 10*3/uL (ref 1.7–7.7)
Neutrophils Relative %: 78 %
Platelets: 75 10*3/uL — ABNORMAL LOW (ref 150–400)
RBC: 3.26 MIL/uL — ABNORMAL LOW (ref 3.87–5.11)
RDW: 16.3 % — ABNORMAL HIGH (ref 11.5–15.5)
WBC: 8 10*3/uL (ref 4.0–10.5)
nRBC: 0 % (ref 0.0–0.2)

## 2020-11-22 LAB — COMPREHENSIVE METABOLIC PANEL
ALT: <5 U/L (ref 0–44)
AST: 33 U/L (ref 15–41)
Albumin: 2.5 g/dL — ABNORMAL LOW (ref 3.5–5.0)
Alkaline Phosphatase: 52 U/L (ref 38–126)
Anion gap: 5 (ref 5–15)
BUN: 8 mg/dL (ref 6–20)
CO2: 28 mmol/L (ref 22–32)
Calcium: 7.9 mg/dL — ABNORMAL LOW (ref 8.9–10.3)
Chloride: 103 mmol/L (ref 98–111)
Creatinine, Ser: 0.49 mg/dL (ref 0.44–1.00)
GFR, Estimated: >60 mL/min (ref >60)
Glucose, Bld: 252 mg/dL — ABNORMAL HIGH (ref 70–99)
Potassium: 2.9 mmol/L — ABNORMAL LOW (ref 3.5–5.1)
Sodium: 136 mmol/L (ref 135–145)
Total Bilirubin: 0.7 mg/dL (ref 0.3–1.2)
Total Protein: 6.3 g/dL — ABNORMAL LOW (ref 6.5–8.1)

## 2020-11-22 LAB — GLUCOSE, CAPILLARY
Glucose-Capillary: 194 mg/dL — ABNORMAL HIGH (ref 70–99)
Glucose-Capillary: 200 mg/dL — ABNORMAL HIGH (ref 70–99)
Glucose-Capillary: 223 mg/dL — ABNORMAL HIGH (ref 70–99)
Glucose-Capillary: 225 mg/dL — ABNORMAL HIGH (ref 70–99)
Glucose-Capillary: 239 mg/dL — ABNORMAL HIGH (ref 70–99)
Glucose-Capillary: 262 mg/dL — ABNORMAL HIGH (ref 70–99)
Glucose-Capillary: 270 mg/dL — ABNORMAL HIGH (ref 70–99)

## 2020-11-22 LAB — HSV 1/2 PCR, CSF
HSV-1 DNA: NEGATIVE
HSV-2 DNA: NEGATIVE

## 2020-11-22 LAB — TRIGLYCERIDES: Triglycerides: 143 mg/dL (ref <150)

## 2020-11-22 MED ORDER — OXYCODONE HCL 5 MG PO TABS
5.0000 mg | ORAL_TABLET | ORAL | Status: DC | PRN
  Filled 2020-11-22: qty 1

## 2020-11-22 MED ORDER — HYDRALAZINE HCL 50 MG PO TABS
25.0000 mg | ORAL_TABLET | Freq: Three times a day (TID) | ORAL | Status: DC
  Administered 2020-11-22 – 2020-11-23 (×3): 25 mg via ORAL
  Filled 2020-11-22 (×3): qty 1

## 2020-11-22 MED ORDER — CHLORHEXIDINE GLUCONATE 0.12 % MT SOLN
15.0000 mL | Freq: Two times a day (BID) | OROMUCOSAL | Status: DC
  Administered 2020-11-22 – 2020-11-28 (×10): 15 mL via OROMUCOSAL
  Filled 2020-11-22 (×10): qty 15

## 2020-11-22 MED ORDER — KETOROLAC TROMETHAMINE 30 MG/ML IJ SOLN
15.0000 mg | Freq: Four times a day (QID) | INTRAMUSCULAR | Status: AC | PRN
  Administered 2020-11-22 – 2020-11-27 (×5): 15 mg via INTRAVENOUS
  Filled 2020-11-22 (×5): qty 1

## 2020-11-22 MED ORDER — INSULIN GLARGINE-YFGN 100 UNIT/ML ~~LOC~~ SOLN
35.0000 [IU] | Freq: Every day | SUBCUTANEOUS | Status: DC
  Administered 2020-11-23 – 2020-11-24 (×2): 35 [IU] via SUBCUTANEOUS
  Filled 2020-11-22 (×3): qty 0.35

## 2020-11-22 MED ORDER — ORAL CARE MOUTH RINSE
15.0000 mL | Freq: Two times a day (BID) | OROMUCOSAL | Status: DC
  Administered 2020-11-22 – 2020-12-04 (×18): 15 mL via OROMUCOSAL

## 2020-11-22 MED ORDER — POTASSIUM CHLORIDE CRYS ER 20 MEQ PO TBCR
40.0000 meq | EXTENDED_RELEASE_TABLET | ORAL | Status: AC
Start: 2020-11-22 — End: 2020-11-22
  Administered 2020-11-22 (×2): 40 meq via ORAL
  Filled 2020-11-22 (×2): qty 2

## 2020-11-22 MED ORDER — POTASSIUM CHLORIDE 2 MEQ/ML IV SOLN
INTRAVENOUS | Status: DC
  Filled 2020-11-22 (×7): qty 1000

## 2020-11-22 MED ORDER — MAGIC MOUTHWASH W/LIDOCAINE
15.0000 mL | Freq: Three times a day (TID) | ORAL | Status: DC
  Administered 2020-11-22 – 2020-11-27 (×14): 15 mL via ORAL
  Filled 2020-11-22 (×20): qty 15

## 2020-11-22 NOTE — Progress Notes (Signed)
PROGRESS NOTE    Tonya Myers  TDD:[id number] DOB: 30-Jun-1965 DOA: 11/19/2020 PCP: Tonya Pink, MD   Brief Narrative:  55 y.o. female with medical history significant for recent pneumococcal meningitis and bacteremia, depression, hypothyroid, IDDM2, presents to the ED for chief concern of altered mentation.   At bedside, she had expressive aphasia, right upper and lower extremity weakness. Confusion today per spouse. Can walk with a walker and assistance. Jumbled words. She has baseline weakness in the right arm compared to left arm.    Spouse reports that patient had poor PO intake for more than a week. Spouse endorses vomiting and dnies blood and black coffee ground substances.   Patient has been unable to tolerate the MRI due to severe claustrophobia.  Will attempt again with help of IV Ativan.  Lumbar puncture was performed, initial results not terribly impressive.  Not overtly concerning for meningitis.  Remains on broad-spectrum IV antibiotics and IV acyclovir.  Infectious disease consulted.  Concern for possible manifestation of underlying hematologic malignancy.  I have requested consultation from oncology.  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  She sees oncology only once yearly.  Patient was evaluated by oncology.  Very unlikely that current presentation is a manifestation of underlying CLL.  No clear infectious signs.  All cultures remain negative.  LP is inconsistent with meningitis.  CT head chest abdomen pelvis significant surprisingly for pancreatitis.  Lipase is elevated.   Assessment & Plan:   Principal Problem:   Altered mental status Active Problems:   DDD (degenerative disc disease), cervical   Aphasia   Anemia of chronic disease   Acute focal neurological deficit   Encephalitis   Encephalopathy acute  Acute pancreatitis Unclear etiology No reported history of alcohol intake, no radiographic evidence of gallstones Possible  drug-induced however unclear exactly which drug could have driven this Initial abdominal examination negative for tenderness On interview patient does endorse left upper quadrant tenderness Plan: Trend lipase Check triglycerides Intravenous fluids Diet as tolerated as there is no evidence of pancreatic necrosis or pseudocyst formation  Acute encephalopathy Unclear etiology Differentials include meningitis, viral versus bacterial versus aseptic Also CVA, hematologic malignancy, brain abscess Infectious disease on consult LP not indicative of bacterial meningitis Consideration for recurrence of underlying hematologic malignancy Unfortunately patient unable to tolerate MRI Oncology consultation appreciated, no clinical evidence of CLL recurrence Plan: As there is no evidence for bacterial or viral meningitis we will discontinue Rocephin and acyclovir.  Discontinue IV steroids.  Continue to monitor vitals and fever curve.  Monitor mental status closely.  If mental status does not improve can consider psychiatry versus neurology consultation.  Patient has been evaluated by neurology in the past and no clear cause for patient's symptoms has been identified.  Pancytopenia History of CLL All 3 cell lines acutely depressed Oncology consulted Patient's CLL has been in remission since 2016 Recurrence of hematologic note malignancy felt unlikely Plan: Daily CBC with differential Appreciate oncology evaluation  Marked hypokalemia Suspect secondary to vomiting and poor p.o. intake Improved after replacement Check daily labs  Oral thrush Patient with clinical indications of thrush Associated with difficulty swallowing Seems to be improving Plan: Diflucan 150 mg p.o. daily Nystatin swish and swallow 4 times daily  Insulin-dependent diabetes mellitus Patient is on long-acting formulation 50 units daily at home in addition to metformin This is on hold Plan: Glargine 35 units  daily Sliding scale coverage Carb modified diet Diabetes coordinator consult  Hyperlipidemia  PTA statin  Hypothyroid PTA Synthroid   DVT prophylaxis: TED hose Code Status: Full code Family Communication: Spouse Tonya Myers [phone] on 7/29 Disposition Plan: Status is: Inpatient  Remains inpatient appropriate because:Inpatient level of care appropriate due to severity of illness  Dispo: The patient is from: Home              Anticipated d/c is to: Home              Patient currently is not medically stable to d/c.   Difficult to place patient No  Acute encephalopathy of unclear etiology.  No evidence of meningitis.  No evidence of CLL recurrence.  Unclear disposition plan.  Therapy evaluations to be requested.     Level of care: Med-Surg  Consultants:  ID Oncology  Procedures:  Lumbar puncture 7/28  Antimicrobials:    Subjective: Patient seen and examined.  Continues to be more awake.  Still somewhat confused.  No visible distress.  Answers questions appropriately.  Objective: Vitals:   11/21/20 2001 11/22/20 0009 11/22/20 0351 11/22/20 0737  BP: (!) 161/89 (!) 174/89 (!) 162/85 (!) 165/91  Pulse: (!) 55 (!) 51 (!) 55 (!) 55  Resp: 18 18 18 20   Temp: 98.9 F (37.2 C) 98.4 F (36.9 C) 98.6 F (37 C) 98.6 F (37 C)  TempSrc:  Oral Oral   SpO2: 99% 99% 99% 96%  Weight:      Height:        Intake/Output Summary (Last 24 hours) at 11/22/2020 1037 Last data filed at 11/22/2020 0500 Gross per 24 hour  Intake 2369.32 ml  Output 175 ml  Net 2194.32 ml   Filed Weights   11/20/20 0507  Weight: 83 kg    Examination:  General exam: Resting comfortably in bed.  No visible distress.  Confused Respiratory system: Lungs clear.  Normal work of breathing.  Room air Cardiovascular system: S1-S2, regular rate and rhythm, no murmurs, no pedal edema  gastrointestinal system: Soft, nondistended, mild TTP left upper quadrant, normal bowel sounds Central nervous  system: Alert, oriented x2, confused, no focal deficits Extremities: Decreased power symmetrically Skin: No obvious rashes or lesions Psychiatry: Judgement and insight appear impaired. Mood & affect flattened.     Data Reviewed: I have personally reviewed following labs and imaging studies  CBC: Recent Labs  Lab 11/19/20 1648 11/20/20 0955 11/21/20 0525 11/22/20 0650  WBC 2.4* 2.3* 3.6* 8.0  NEUTROABS  --  1.4* 2.5 6.2  HGB 9.2* 8.8* 8.1* 8.7*  HCT 29.4* 27.9* 25.6* 26.8*  MCV 83.1 84.5 83.1 82.2  PLT 72* 63* 72* 75*   Basic Metabolic Panel: Recent Labs  Lab 11/19/20 1648 11/20/20 0602 11/20/20 0737 11/21/20 0525 11/22/20 0650  NA 141  --  139 140 136  K 2.8*  --  3.4* 2.5* 2.9*  CL 105  --  104 107 103  CO2 25  --  23 28 28   GLUCOSE 157*  --  266* 229* 252*  BUN 9  --  11 8 8   CREATININE 0.75  --  0.59 0.50 0.49  CALCIUM 8.3*  --  7.6* 8.1* 7.9*  MG  --  1.6*  --   --   --    GFR: Estimated Creatinine Clearance: 90.6 mL/min (by C-G formula based on SCr of 0.49 mg/dL). Liver Function Tests: Recent Labs  Lab 11/19/20 1648 11/20/20 0737 11/21/20 0525 11/22/20 0650  AST 54* 42* 31 33  ALT 5 <5 <5 <5  UJWJXBJ 47  59 56 52  BILITOT 1.3* 0.9 0.5 0.7  PROT 7.1 6.4* 6.8 6.3*  ALBUMIN 2.7* 2.3* 2.4* 2.5*   Recent Labs  Lab 11/21/20 1800  LIPASE 633*   Recent Labs  Lab 11/20/20 0545  AMMONIA 17   Coagulation Profile: Recent Labs  Lab 11/20/20 0602  INR 1.0   Cardiac Enzymes: No results for input(s): CKTOTAL, CKMB, CKMBINDEX, TROPONINI in the last 168 hours. BNP (last 3 results) No results for input(s): PROBNP in the last 8760 hours. HbA1C: No results for input(s): HGBA1C in the last 72 hours. CBG: Recent Labs  Lab 11/21/20 1628 11/21/20 2003 11/22/20 0012 11/22/20 0357 11/22/20 0737  GLUCAP 378* 272* 270* 225* 239*   Lipid Profile: Recent Labs    11/22/20 0650  TRIG 143   Thyroid Function Tests: Recent Labs    11/20/20 0545  TSH  0.120*   Anemia Panel: Recent Labs    11/21/20 1650  VITAMINB12 761  FOLATE 13.2  FERRITIN 1,032*  TIBC 147*  IRON 46   Sepsis Labs: Recent Labs  Lab 11/19/20 1648 11/19/20 1846 11/20/20 0737  PROCALCITON  --   --  0.41  LATICACIDVEN 2.0* 1.9  --     Recent Results (from the past 240 hour(s))  Blood culture (routine x 2)     Status: None (Preliminary result)   Collection Time: 11/19/20  4:47 PM   Specimen: BLOOD  Result Value Ref Range Status   Specimen Description BLOOD RIGHT ANTECUBITAL  Final   Special Requests   Final    BOTTLES DRAWN AEROBIC AND ANAEROBIC Blood Culture adequate volume   Culture   Final    NO GROWTH 2 DAYS Performed at [facility], [address] [place], [place] [address]    Report Status PENDING  Incomplete  Resp Panel by RT-PCR (Flu A&B, Covid) Nasopharyngeal Swab     Status: None   Collection Time: 11/19/20  4:48 PM   Specimen: Nasopharyngeal Swab; Nasopharyngeal(NP) swabs in vial transport medium  Result Value Ref Range Status   SARS Coronavirus 2 by RT PCR NEGATIVE NEGATIVE Final    Comment: (NOTE) SARS-CoV-2 target nucleic acids are NOT DETECTED.  The SARS-CoV-2 RNA is generally detectable in upper respiratory specimens during the acute phase of infection. The lowest concentration of SARS-CoV-2 viral copies this assay can detect is 138 copies/mL. A negative result does not preclude SARS-Cov-2 infection and should not be used as the sole basis for treatment or other patient management decisions. A negative result may occur with  improper specimen collection/handling, submission of specimen other than nasopharyngeal swab, presence of viral mutation(s) within the areas targeted by this assay, and inadequate number of viral copies(<138 copies/mL). A negative result must be combined with clinical observations, patient history, and epidemiological information. The expected result is Negative.  Fact Sheet for Patients:   EntrepreneurPulse.com.au  Fact Sheet for Healthcare Providers:  IncredibleEmployment.be  This test is no t yet approved or cleared by the [place] FDA and  has been authorized for detection and/or diagnosis of SARS-CoV-2 by FDA under an Emergency Use Authorization (EUA). This EUA will remain  in effect (meaning this test can be used) for the duration of the COVID-19 declaration under Section 564(b)(1) of the Act, 21 U.S.C.section 360bbb-3(b)(1), unless the authorization is terminated  or revoked sooner.       Influenza A by PCR NEGATIVE NEGATIVE Final   Influenza B by PCR NEGATIVE NEGATIVE Final    Comment: (NOTE) The Xpert Xpress  SARS-CoV-2/FLU/RSV plus assay is intended as an aid in the diagnosis of influenza from Nasopharyngeal swab specimens and should not be used as a sole basis for treatment. Nasal washings and aspirates are unacceptable for Xpert Xpress SARS-CoV-2/FLU/RSV testing.  Fact Sheet for Patients: EntrepreneurPulse.com.au  Fact Sheet for Healthcare Providers: IncredibleEmployment.be  This test is not yet approved or cleared by the [place] FDA and has been authorized for detection and/or diagnosis of SARS-CoV-2 by FDA under an Emergency Use Authorization (EUA). This EUA will remain in effect (meaning this test can be used) for the duration of the COVID-19 declaration under Section 564(b)(1) of the Act, 21 U.S.C. section 360bbb-3(b)(1), unless the authorization is terminated or revoked.  Performed at [facility], [address] [place], [place] [address]   Blood culture (routine x 2)     Status: None (Preliminary result)   Collection Time: 11/19/20  6:46 PM   Specimen: BLOOD  Result Value Ref Range Status   Specimen Description BLOOD RIGHT ANTECUBITAL  Final   Special Requests   Final    BOTTLES DRAWN AEROBIC AND ANAEROBIC Blood Culture adequate volume   Culture    Final    NO GROWTH 2 DAYS Performed at [facility], [address] [place], [place] [address]    Report Status PENDING  Incomplete  Urine Culture     Status: Abnormal   Collection Time: 11/19/20  9:33 PM   Specimen: Urine, Clean Catch  Result Value Ref Range Status   Specimen Description   Final    URINE, CLEAN CATCH Performed at [facility], [address] [place], [place] [address]    Special Requests   Final    NONE Performed at [facility], [address], [place], [place] [address]    Culture (A)  Final    <10,000 COLONIES/mL INSIGNIFICANT GROWTH Performed at [place] [address], [place], [place] [address]    Report Status 11/21/2020 FINAL  Final  Culture, fungus without smear     Status: None (Preliminary result)   Collection Time: 11/20/20 11:09 AM   Specimen: CSF; Cerebrospinal Fluid  Result Value Ref Range Status   Specimen Description   Final    CSF Performed at [facility], [address], [place], [place] [address]    Special Requests   Final    NONE Performed at [facility], [address] [place], [place] [address]    Culture   Final    NO GROWTH < 24 HOURS Performed at [facility], [place] [address] [place], [place] [address]    Report Status PENDING  Incomplete  CSF culture w Gram Stain     Status: None (Preliminary result)   Collection Time: 11/20/20 11:09 AM   Specimen: PATH Cytology CSF; Cerebrospinal Fluid  Result Value Ref Range Status   Specimen Description   Final    CSF Performed at [facility], [address] [place], [place] [address]    Special Requests   Final    NONE Performed at [facility], [address] [place], [place] [address]    Gram Stain   Final    NO ORGANISMS SEEN WBC SEEN RED BLOOD CELLS PRESENT Performed at [facility], [address], [place], [place] [address]    Culture   Final    NO GROWTH 2  DAYS Performed at [facility], [place] [address] [place], [place] [address]  Report Status PENDING  Incomplete         Radiology Studies: CT HEAD W & WO CONTRAST  Result Date: 11/21/2020 CLINICAL DATA:  Metastatic disease evaluation. Additional provided: Patient with history of recent pneumococcal meningitis and bacteremia, diabetes mellitus presenting with altered mental status. EXAM: CT HEAD WITHOUT AND WITH CONTRAST TECHNIQUE: Contiguous axial images were obtained from the base of the skull through the vertex without and with intravenous contrast CONTRAST:  14mL OMNIPAQUE IOHEXOL 350 MG/ML SOLN COMPARISON:  Prior head CT examinations 11/19/2020 and earlier. FINDINGS: Brain: Cerebral volume is normal for age. There is no acute intracranial hemorrhage. No demarcated cortical infarct. No extra-axial fluid collection. No evidence of an intracranial mass. No midline shift. No pathologic intracranial enhancement is identified. Vascular: Atherosclerotic calcifications. Skull: Normal. Negative for fracture or focal suspicious osseous lesion. Sinuses/Orbits: Visualized orbits show no acute finding. Minimal frothy secretions within the left sphenoid sinus. Small right sphenoid sinus mucous retention cyst. Minimal partial opacification of the posterior right ethmoid air cells. IMPRESSION: No evidence of acute intracranial abnormality. No CT evidence of intracranial metastatic disease. Mild paranasal sinus disease at the imaged levels, as described. Electronically Signed   By: [name] DO   On: 11/21/2020 15:42   CT CHEST ABDOMEN PELVIS W CONTRAST  Result Date: 11/21/2020 CLINICAL DATA:  Altered mental status. Recent history of pneumococcal meningitis and bacteremia. Coffee ground emesis. EXAM: CT CHEST, ABDOMEN, AND PELVIS WITH CONTRAST TECHNIQUE: Multidetector CT imaging of the chest, abdomen and pelvis was performed following the standard protocol during bolus administration of intravenous  contrast. CONTRAST:  100 mL OMNIPAQUE IOHEXOL 350 MG/ML SOLN COMPARISON:  CT abdomen and pelvis 05/23/2020. FINDINGS: CT CHEST FINDINGS Cardiovascular: There is mild cardiomegaly and a small pericardial effusion. No atherosclerosis is seen. Mediastinum/Nodes: Innumerable axillary and subpectoral lymph nodes are new since the prior chest CT. Index node in the right axilla measuring 1 cm short axis dimension is seen on image 21 of series 3. A left subpectoral node measuring 1.3 cm is seen on image 24. Small mediastinal lymph nodes include a precarinal node on image 27 measuring 1 cm. The esophagus and thyroid are negative. Lungs/Pleura: There is some dependent atelectasis. A 0.7 x 0.5 cm right upper lobe nodule on image 72 of series 6 measure approximately 0.7 x 0.4 cm on the prior exam. There is extensive micronodularity in the right upper lobe. Musculoskeletal: No acute or focal abnormality CT ABDOMEN PELVIS FINDINGS Hepatobiliary: No focal liver abnormality is seen. No gallstones, gallbladder wall thickening, or biliary dilatation. The liver is diffusely low attenuating consistent with marked fatty infiltration. Pancreas: Vanessa Marion is present about the pancreas throughout is consistent with acute pancreatitis. No focal fluid collection. The pancreas enhances homogeneously. Spleen: Normal in size without focal abnormality. Adrenals/Urinary Tract: The adrenal glands appear normal. A single small cyst is seen in the left kidney. On delayed imaging, there are areas of decreased attenuation in both kidneys which may be artifactual. No hydronephrosis. No ureteral or urinary bladder stone. Stomach/Bowel: Stomach is within normal limits. Appendix appears normal. No evidence of bowel wall thickening, distention, or inflammatory changes. Vascular/Lymphatic: No significant vascular findings are present. No enlarged abdominal or pelvic lymph nodes. Reproductive: Status post hysterectomy. No adnexal masses. Other: None.  Musculoskeletal: No acute or focal bony abnormality. IMPRESSION: The examination is positive for findings consistent with acute pancreatitis without pseudocyst formation or pancreatic necrosis. Micronodularity in the right upper lobe is worrisome for bronchopneumonia. 0.7 x 0.5 cm right upper  lobe nodule has mildly increased in size since 2017. Non-contrast chest CT at 6-12 months is recommended. If the nodule is stable at time of repeat CT, then future CT at 18-24 months (from today's scan) is considered optional for low-risk patients, but is recommended for high-risk patients. This recommendation follows the consensus statement: Guidelines for Management of Incidental Pulmonary Nodules Detected on CT Images: From the Fleischner Society 2017; Radiology 2017; 284:228-243. Multiple axillary and subpectoral lymph nodes are abnormal in number and likely related to this patient's history of CLL. Cardiomegaly and small pericardial effusion. Marked fatty infiltration of the liver. Decreased cortical attenuation the kidneys is likely artifactual. Correlation with urinalysis could be used to exclude pyelonephritis. Electronically Signed   By: Inge Rise M.D.   On: 11/21/2020 16:55   DG FL GUIDED LUMBAR PUNCTURE  Result Date: 11/20/2020 CLINICAL DATA:  Altered mental status EXAM: DIAGNOSTIC LUMBAR PUNCTURE UNDER FLUOROSCOPIC GUIDANCE COMPARISON:  None FLUOROSCOPY TIME:  Fluoroscopy Time:  12 seconds Radiation Exposure Index (if provided by the fluoroscopic device): 1.6 mGy PROCEDURE: Informed consent was obtained from the patient's husband prior to the procedure, including potential complications of headache, allergy, and pain. With the patient prone, the lower back was prepped with Betadine. 1% Lidocaine was used for local anesthesia. Lumbar puncture was performed at the L4-L5 level using a 20 gauge needle with return of clear CSF. 8 ml of CSF were obtained for laboratory studies. The patient tolerated the  procedure well and there were no apparent complications. IMPRESSION: Technically successful fluoroscopic guided lumbar puncture. Electronically Signed   By: Macy Mis M.D.   On: 11/20/2020 11:42        Scheduled Meds:  acetaminophen  1,000 mg Oral Once   diltiazem  120 mg Oral Daily   fluconazole  150 mg Oral Daily   insulin aspart  0-15 Units Subcutaneous Q4H   insulin glargine-yfgn  30 Units Subcutaneous Daily   levothyroxine  100 mcg Oral QAC breakfast   lidocaine (PF)  5 mL Other Once   nystatin  5 mL Oral QID   pantoprazole  40 mg Oral Daily   potassium chloride  40 mEq Oral Q2H   rosuvastatin  10 mg Oral QHS   Continuous Infusions:  lactated ringers with kcl 100 mL/hr at 11/22/20 1003     LOS: 2 days    Time spent: 35 minutes    Sidney Ace, MD [facility] Pager [phone]  If 7PM-7AM, please contact night-coverage 11/22/2020, 10:37 AM

## 2020-11-22 NOTE — Progress Notes (Signed)
Noted at the shift that temp  was 100.4 at 1545. Rest of vitals stable at that time. Temperature rechecked at [place], temp was 98.2.

## 2020-11-23 DIAGNOSIS — R4701 Aphasia: Secondary | ICD-10-CM | POA: Diagnosis not present

## 2020-11-23 DIAGNOSIS — R404 Transient alteration of awareness: Secondary | ICD-10-CM | POA: Diagnosis not present

## 2020-11-23 LAB — COMPREHENSIVE METABOLIC PANEL
ALT: 5 U/L (ref 0–44)
AST: 28 U/L (ref 15–41)
Albumin: 2.5 g/dL — ABNORMAL LOW (ref 3.5–5.0)
Alkaline Phosphatase: 58 U/L (ref 38–126)
Anion gap: 6 (ref 5–15)
BUN: 9 mg/dL (ref 6–20)
CO2: 29 mmol/L (ref 22–32)
Calcium: 8.1 mg/dL — ABNORMAL LOW (ref 8.9–10.3)
Chloride: 101 mmol/L (ref 98–111)
Creatinine, Ser: 0.51 mg/dL (ref 0.44–1.00)
GFR, Estimated: >60 mL/min (ref >60)
Glucose, Bld: 206 mg/dL — ABNORMAL HIGH (ref 70–99)
Potassium: 4.4 mmol/L (ref 3.5–5.1)
Sodium: 136 mmol/L (ref 135–145)
Total Bilirubin: 0.9 mg/dL (ref 0.3–1.2)
Total Protein: 6.3 g/dL — ABNORMAL LOW (ref 6.5–8.1)

## 2020-11-23 LAB — LIPASE, BLOOD: Lipase: 541 U/L — ABNORMAL HIGH (ref 11–51)

## 2020-11-23 LAB — CBC WITH DIFFERENTIAL/PLATELET
Abs Immature Granulocytes: 0.1 10*3/uL — ABNORMAL HIGH (ref 0.00–0.07)
Basophils Absolute: 0 10*3/uL (ref 0.0–0.1)
Basophils Relative: 0 %
Eosinophils Absolute: 0 10*3/uL (ref 0.0–0.5)
Eosinophils Relative: 0 %
HCT: 29.4 % — ABNORMAL LOW (ref 36.0–46.0)
Hemoglobin: 9.4 g/dL — ABNORMAL LOW (ref 12.0–15.0)
Immature Granulocytes: 1 %
Lymphocytes Relative: 15 %
Lymphs Abs: 1.6 10*3/uL (ref 0.7–4.0)
MCH: 26.3 pg (ref 26.0–34.0)
MCHC: 32 g/dL (ref 30.0–36.0)
MCV: 82.4 fL (ref 80.0–100.0)
Monocytes Absolute: 0.6 10*3/uL (ref 0.1–1.0)
Monocytes Relative: 6 %
Neutro Abs: 8.6 10*3/uL — ABNORMAL HIGH (ref 1.7–7.7)
Neutrophils Relative %: 78 %
Platelets: 67 10*3/uL — ABNORMAL LOW (ref 150–400)
RBC: 3.57 MIL/uL — ABNORMAL LOW (ref 3.87–5.11)
RDW: 16.4 % — ABNORMAL HIGH (ref 11.5–15.5)
Smear Review: NORMAL
WBC Morphology: ABNORMAL
WBC: 10.9 10*3/uL — ABNORMAL HIGH (ref 4.0–10.5)
nRBC: 0 % (ref 0.0–0.2)

## 2020-11-23 LAB — GLUCOSE, CAPILLARY
Glucose-Capillary: 188 mg/dL — ABNORMAL HIGH (ref 70–99)
Glucose-Capillary: 192 mg/dL — ABNORMAL HIGH (ref 70–99)
Glucose-Capillary: 201 mg/dL — ABNORMAL HIGH (ref 70–99)
Glucose-Capillary: 212 mg/dL — ABNORMAL HIGH (ref 70–99)
Glucose-Capillary: 175 mg/dL — ABNORMAL HIGH (ref 70–99)

## 2020-11-23 LAB — CSF CULTURE W GRAM STAIN
Culture: NO GROWTH
Gram Stain: NONE SEEN

## 2020-11-23 MED ORDER — HYDRALAZINE HCL 50 MG PO TABS
50.0000 mg | ORAL_TABLET | Freq: Three times a day (TID) | ORAL | Status: DC
  Administered 2020-11-23 – 2020-12-09 (×49): 50 mg via ORAL
  Filled 2020-11-23 (×49): qty 1

## 2020-11-23 MED ORDER — DEXTROSE 5 % IV SOLN
10.0000 mg/kg | Freq: Three times a day (TID) | INTRAVENOUS | Status: DC
  Administered 2020-11-23 – 2020-11-24 (×4): 830 mg via INTRAVENOUS
  Filled 2020-11-23 (×7): qty 16.6

## 2020-11-23 NOTE — Progress Notes (Signed)
[place]  [phone] [phone]  ID: Tonya Myers OB: [birth year]  MR#: [id number]  XTK#:[id number]  Patient Care Team: [name], MD as PCP - General (Family Medicine)  CHIEF COMPLAINT: Altered mental status/confusion, acute pancreatitis.  INTERVAL HISTORY: Patient remains confused.  Husband at bedside.  No new complaints.  REVIEW OF SYSTEMS:   Review of Systems  Unable to perform ROS: Mental status change    PAST MEDICAL HISTORY: Past Medical History:  Diagnosis Date   Acute hemorrhoid 03/11/2015   Anxiety    Arthritis    Asthma    Cancer ([place])    Chronic back pain    CLL (chronic lymphocytic leukemia) ([place])    Depression    Diabetes mellitus without complication ([place])    Genital herpes    type 2   Hypertension    Hypothyroidism    Vertigo     PAST SURGICAL HISTORY: Past Surgical History:  Procedure Laterality Date   ABDOMINAL HYSTERECTOMY     BILATERAL SALPINGOOPHORECTOMY  2009   BREAST BIOPSY Right 05/17/2016   FIBROADENOMATOUS CHANGE AND SCLEROSING ADENOSIS WITH   COLONOSCOPY WITH PROPOFOL N/A 06/16/2015   Procedure: COLONOSCOPY WITH PROPOFOL;  Surgeon: [name], MD;  Location: [facility] ENDOSCOPY;  Service: Endoscopy;  Laterality: N/A;   LAPAROSCOPIC SUPRACERVICAL HYSTERECTOMY  2009   due to [place] Bilateral 06/17/2020   Procedure: THORACIC LAMINECTOMY FOR EPIDURAL ABSCESS;  Surgeon: Deetta Perla, MD;  Location: [facility] ORS;  Service: Neurosurgery;  Laterality: Bilateral;    FAMILY HISTORY: Family History  Problem Relation Age of Onset   Cancer Paternal Aunt    Breast cancer Maternal Aunt 22    ADVANCED DIRECTIVES (Y/N):  _0 @  HEALTH MAINTENANCE: Social History   Tobacco Use   Smoking status: Never   Smokeless tobacco: Never  Vaping Use   Vaping Use: Never used  Substance Use Topics   Alcohol use: Yes    Alcohol/week: 1.0  standard drink    Types: 1 Cans of beer per week   Drug use: No     Colonoscopy:  PAP:  Bone density:  Lipid panel:  Allergies  Allergen Reactions   Amoxapine    Penicillins     Current Facility-Administered Medications  Medication Dose Route Frequency Provider Last Rate Last Admin   acetaminophen (TYLENOL) tablet 650 mg  650 mg Oral Q6H PRN Cox, Amy N, DO   650 mg at 11/22/20 9924   Or   acetaminophen (TYLENOL) suppository 650 mg  650 mg Rectal Q6H PRN Cox, Amy N, DO       acetaminophen (TYLENOL) tablet 1,000 mg  1,000 mg Oral Once Cox, Amy N, DO       acyclovir (ZOVIRAX) 830 mg in dextrose 5 % 150 mL IVPB  10 mg/kg Intravenous Q8H Beers, Brandon D, RPH 166.6 mL/hr at 11/23/20 1103 830 mg at 11/23/20 1103   albuterol (PROVENTIL) (2.5 MG/3ML) 0.083% nebulizer solution 3 mL  3 mL Inhalation Q6H PRN Cox, Amy N, DO       chlorhexidine (PERIDEX) 0.12 % solution 15 mL  15 mL Mouth Rinse BID Sreenath, Sudheer B, MD   15 mL at 11/23/20 1034   diltiazem (CARDIZEM CD) 24 hr capsule 120 mg  120 mg Oral Daily Cox, Amy N, DO   120 mg at 11/22/20 0943   fluconazole (DIFLUCAN) tablet 150 mg  150 mg Oral Daily Sidney Ace, MD  150 mg at 11/23/20 1042   hydrALAZINE (APRESOLINE) tablet 50 mg  50 mg Oral Q8H Sreenath, Sudheer B, MD   50 mg at 11/23/20 1205   insulin aspart (novoLOG) injection 0-15 Units  0-15 Units Subcutaneous Q4H Ralene Muskrat B, MD   5 Units at 11/23/20 1204   insulin glargine-yfgn (SEMGLEE) injection 35 Units  35 Units Subcutaneous Daily Ralene Muskrat B, MD   35 Units at 11/23/20 1033   ketorolac (TORADOL) 30 MG/ML injection 15 mg  15 mg Intravenous Q6H PRN Ralene Muskrat B, MD   15 mg at 11/23/20 0759   lactated ringers 1,000 mL with potassium chloride 40 mEq infusion   Intravenous Continuous Ralene Muskrat B, MD 100 mL/hr at 11/23/20 0644 New Bag at 11/23/20 0644   levothyroxine (SYNTHROID) tablet 100 mcg  100 mcg Oral QAC breakfast Ralene Muskrat B, MD    100 mcg at 11/23/20 0508   lidocaine (PF) (XYLOCAINE) 1 % injection 5 mL  5 mL Other Once Cox, Amy N, DO       LORazepam (ATIVAN) injection 1 mg  1 mg Intravenous PRN Cox, Amy N, DO       magic mouthwash w/lidocaine  15 mL Oral TID Ralene Muskrat B, MD   15 mL at 11/23/20 1036   MEDLINE mouth rinse  15 mL Mouth Rinse q12n4p Sreenath, Sudheer B, MD   15 mL at 11/23/20 1205   nystatin (MYCOSTATIN) 100000 UNIT/ML suspension 500,000 Units  5 mL Oral QID Ralene Muskrat B, MD   500,000 Units at 11/23/20 1041   ondansetron (ZOFRAN) tablet 4 mg  4 mg Oral Q6H PRN Cox, Amy N, DO       Or   ondansetron (ZOFRAN) injection 4 mg  4 mg Intravenous Q6H PRN Cox, Amy N, DO   4 mg at 11/23/20 0751   pantoprazole (PROTONIX) EC tablet 40 mg  40 mg Oral Daily Cox, Amy N, DO   40 mg at 11/23/20 1042   rosuvastatin (CRESTOR) tablet 10 mg  10 mg Oral QHS Cox, Amy N, DO   10 mg at 11/22/20 2040    OBJECTIVE: Vitals:   11/23/20 1117 11/23/20 1120  BP: (!) 182/87 (!) 174/88  Pulse: (!) 46 (!) 50  Resp: 16   Temp: 97.9 F (36.6 C)   SpO2: 98% 100%     Body mass index is 24.82 kg/m.    ECOG FS:3 - Symptomatic, >50% confined to bed  General: Well-developed, well-nourished, no acute distress. Eyes: Pink conjunctiva, anicteric sclera. HEENT: Normocephalic, moist mucous membranes. Lungs: No audible wheezing or coughing. Heart: Regular rate and rhythm. Abdomen: Soft, nontender, no obvious distention. Musculoskeletal: No edema, cyanosis, or clubbing. Neuro: Alert, but confused.  Cranial nerves grossly intact. Skin: No rashes or petechiae noted. Psych: Flat affect.  LAB RESULTS:  Lab Results  Component Value Date   NA 136 11/23/2020   K 4.4 11/23/2020   CL 101 11/23/2020   CO2 29 11/23/2020   GLUCOSE 206 (H) 11/23/2020   BUN 9 11/23/2020   CREATININE 0.51 11/23/2020   CALCIUM 8.1 (L) 11/23/2020   PROT 6.3 (L) 11/23/2020   ALBUMIN 2.5 (L) 11/23/2020   AST 28 11/23/2020   ALT 5 11/23/2020    ALKPHOS 58 11/23/2020   BILITOT 0.9 11/23/2020   GFRNONAA >60 11/23/2020   GFRAA >60 01/17/2020    Lab Results  Component Value Date   WBC 10.9 (H) 11/23/2020   NEUTROABS 8.6 (H) 11/23/2020   HGB 9.4 (  L) 11/23/2020   HCT 29.4 (L) 11/23/2020   MCV 82.4 11/23/2020   PLT 67 (L) 11/23/2020     STUDIES: CT Head Wo Contrast  Result Date: 11/19/2020 CLINICAL DATA:  Mental status changes of unknown cause. EXAM: CT HEAD WITHOUT CONTRAST TECHNIQUE: Contiguous axial images were obtained from the base of the skull through the vertex without intravenous contrast. COMPARISON:  10/02/2020 FINDINGS: Brain: The brain shows a normal appearance without evidence of malformation, atrophy, old or acute small or large vessel infarction, mass lesion, hemorrhage, hydrocephalus or extra-axial collection. Vascular: No hyperdense vessel. No evidence of atherosclerotic calcification. Skull: Normal.  No traumatic finding.  No focal bone lesion. Sinuses/Orbits: Sinuses are clear. Orbits appear normal. Mastoids are clear. Other: None significant IMPRESSION: Normal head CT. Electronically Signed   By: Nelson Chimes M.D.   On: 11/19/2020 16:19   CT HEAD W & WO CONTRAST  Result Date: 11/21/2020 CLINICAL DATA:  Metastatic disease evaluation. Additional provided: Patient with history of recent pneumococcal meningitis and bacteremia, diabetes mellitus presenting with altered mental status. EXAM: CT HEAD WITHOUT AND WITH CONTRAST TECHNIQUE: Contiguous axial images were obtained from the base of the skull through the vertex without and with intravenous contrast CONTRAST:  139m OMNIPAQUE IOHEXOL 350 MG/ML SOLN COMPARISON:  Prior head CT examinations 11/19/2020 and earlier. FINDINGS: Brain: Cerebral volume is normal for age. There is no acute intracranial hemorrhage. No demarcated cortical infarct. No extra-axial fluid collection. No evidence of an intracranial mass. No midline shift. No pathologic intracranial enhancement is  identified. Vascular: Atherosclerotic calcifications. Skull: Normal. Negative for fracture or focal suspicious osseous lesion. Sinuses/Orbits: Visualized orbits show no acute finding. Minimal frothy secretions within the left sphenoid sinus. Small right sphenoid sinus mucous retention cyst. Minimal partial opacification of the posterior right ethmoid air cells. IMPRESSION: No evidence of acute intracranial abnormality. No CT evidence of intracranial metastatic disease. Mild paranasal sinus disease at the imaged levels, as described. Electronically Signed   By: KKellie SimmeringDO   On: 11/21/2020 15:42   CT CHEST ABDOMEN PELVIS W CONTRAST  Result Date: 11/21/2020 CLINICAL DATA:  Altered mental status. Recent history of pneumococcal meningitis and bacteremia. Coffee ground emesis. EXAM: CT CHEST, ABDOMEN, AND PELVIS WITH CONTRAST TECHNIQUE: Multidetector CT imaging of the chest, abdomen and pelvis was performed following the standard protocol during bolus administration of intravenous contrast. CONTRAST:  100 mL OMNIPAQUE IOHEXOL 350 MG/ML SOLN COMPARISON:  CT abdomen and pelvis 05/23/2020. FINDINGS: CT CHEST FINDINGS Cardiovascular: There is mild cardiomegaly and a small pericardial effusion. No atherosclerosis is seen. Mediastinum/Nodes: Innumerable axillary and subpectoral lymph nodes are new since the prior chest CT. Index node in the right axilla measuring 1 cm short axis dimension is seen on image 21 of series 3. A left subpectoral node measuring 1.3 cm is seen on image 24. Small mediastinal lymph nodes include a precarinal node on image 27 measuring 1 cm. The esophagus and thyroid are negative. Lungs/Pleura: There is some dependent atelectasis. A 0.7 x 0.5 cm right upper lobe nodule on image 72 of series 6 measure approximately 0.7 x 0.4 cm on the prior exam. There is extensive micronodularity in the right upper lobe. Musculoskeletal: No acute or focal abnormality CT ABDOMEN PELVIS FINDINGS Hepatobiliary: No  focal liver abnormality is seen. No gallstones, gallbladder wall thickening, or biliary dilatation. The liver is diffusely low attenuating consistent with marked fatty infiltration. Pancreas: SVanessa Durhamis present about the pancreas throughout is consistent with acute pancreatitis. No focal fluid collection. The  pancreas enhances homogeneously. Spleen: Normal in size without focal abnormality. Adrenals/Urinary Tract: The adrenal glands appear normal. A single small cyst is seen in the left kidney. On delayed imaging, there are areas of decreased attenuation in both kidneys which may be artifactual. No hydronephrosis. No ureteral or urinary bladder stone. Stomach/Bowel: Stomach is within normal limits. Appendix appears normal. No evidence of bowel wall thickening, distention, or inflammatory changes. Vascular/Lymphatic: No significant vascular findings are present. No enlarged abdominal or pelvic lymph nodes. Reproductive: Status post hysterectomy. No adnexal masses. Other: None. Musculoskeletal: No acute or focal bony abnormality. IMPRESSION: The examination is positive for findings consistent with acute pancreatitis without pseudocyst formation or pancreatic necrosis. Micronodularity in the right upper lobe is worrisome for bronchopneumonia. 0.7 x 0.5 cm right upper lobe nodule has mildly increased in size since 2017. Non-contrast chest CT at 6-12 months is recommended. If the nodule is stable at time of repeat CT, then future CT at 18-24 months (from today's scan) is considered optional for low-risk patients, but is recommended for high-risk patients. This recommendation follows the consensus statement: Guidelines for Management of Incidental Pulmonary Nodules Detected on CT Images: From the Fleischner Society 2017; Radiology 2017; 284:228-243. Multiple axillary and subpectoral lymph nodes are abnormal in number and likely related to this patient's history of CLL. Cardiomegaly and small pericardial effusion. Marked  fatty infiltration of the liver. Decreased cortical attenuation the kidneys is likely artifactual. Correlation with urinalysis could be used to exclude pyelonephritis. Electronically Signed   By: Inge Rise M.D.   On: 11/21/2020 16:55   [place] UPPER EXTREMITY DUPLEX BILATERAL (NON-WBI)  Result Date: 11/05/2020 CLINICAL DATA:  Numbness, tingling and coldness in the upper extremities EXAM: BILATERAL UPPER EXTREMITY ARTERIAL DUPLEX SCAN TECHNIQUE: Gray-scale sonography as well as color Doppler and duplex ultrasound was performed to evaluate the arteries of both upper extremities. COMPARISON:  None. FINDINGS: Right upper extremity: Normal triphasic arterial waveforms throughout the subclavian, axillary, brachial, radial and ulnar artery. No focal atherosclerotic plaque. No evidence of stenosis or occlusion. Left upper extremity: Normal triphasic arterial waveforms throughout the subclavian, axillary, brachial, radial and ulnar artery. No focal atherosclerotic plaque. No evidence of stenosis or occlusion. IMPRESSION: 1. No evidence of hemodynamically significant stenosis or occlusion in either upper extremity arterial tree. 2. Incidentally and incompletely imaged prominent lymph nodes in the axillary spaces bilaterally. These are of uncertain significance and may be reactive in nature. If the patient has palpable lymphadenopathy, or other signs and symptoms suspicious for a lymphoproliferative process, then further imaging (contrast-enhanced CT scan of the chest) may be warranted. Electronically Signed   By: Jacqulynn Cadet M.D.   On: 11/05/2020 15:46   DG Chest Portable 1 View  Result Date: 11/19/2020 CLINICAL DATA:  Cough, fever EXAM: PORTABLE CHEST 1 VIEW COMPARISON:  10/01/2020 FINDINGS: Heart size is upper limits of normal. Low lung volumes. Mildly increased interstitial markings bilaterally. No lobar consolidation. No pleural effusion or pneumothorax. IMPRESSION: Low lung volumes with mildly increased  interstitial markings bilaterally may reflect mild edema or atypical/viral infection. Electronically Signed   By: Davina Poke D.O.   On: 11/19/2020 16:22   DG FL GUIDED LUMBAR PUNCTURE  Result Date: 11/20/2020 CLINICAL DATA:  Altered mental status EXAM: DIAGNOSTIC LUMBAR PUNCTURE UNDER FLUOROSCOPIC GUIDANCE COMPARISON:  None FLUOROSCOPY TIME:  Fluoroscopy Time:  12 seconds Radiation Exposure Index (if provided by the fluoroscopic device): 1.6 mGy PROCEDURE: Informed consent was obtained from the patient's husband prior to the procedure, including potential complications of  headache, allergy, and pain. With the patient prone, the lower back was prepped with Betadine. 1% Lidocaine was used for local anesthesia. Lumbar puncture was performed at the L4-L5 level using a 20 gauge needle with return of clear CSF. 8 ml of CSF were obtained for laboratory studies. The patient tolerated the procedure well and there were no apparent complications. IMPRESSION: Technically successful fluoroscopic guided lumbar puncture. Electronically Signed   By: Macy Mis M.D.   On: 11/20/2020 11:42    ASSESSMENT: Altered mental status/confusion, acute pancreatitis.  PLAN:    1.  Acute encephalopathy/altered mental status/confusion: Etiology remains unclear.  Infectious work-up appears to be negative with no growth in blood, cerebrospinal fluid, or urine cultures. Highly unlikely related to underlying CLL given unimpressive lumbar fluid with negative cytology as well as negative head CT.  Also minimal if any disease present systemically.  Appreciate ID input. 2.  Pancytopenia: Unlikely related to progressive CLL.  Patient essentially had a normal CBC in June 2022.  Does not appear to be medication induced.  May be related to acute illness.  Bone marrow biopsy is not necessary at this time, but if there is no improvement along with patient's clinical status would consider one in the future.  B12, folate, and iron stores are  within normal limits.  Ferritin likely elevated secondary to acute phase reactant.  White blood cell count and hemoglobin improving.  Platelets remain decreased, but stable.  No intervention needed at this time. 3.  CLL: Patient has been in remission since 2016.  No overt systemic evidence of aggressive disease. 4.  Leukocytosis: Mostly neutrophils, therefore likely reactive.  Abnormal lymphocytes noted is not unexpected with history of CLL.  Will follow.   Lloyd Huger, MD   11/23/2020 1:50 PM

## 2020-11-23 NOTE — Progress Notes (Signed)
PROGRESS NOTE    Matricia Myers  IPJ:[id number] DOB: [birth year] DOA: 11/19/2020 PCP: Tonya Pink, MD   Brief Narrative:  55 y.o. female with medical history significant for recent pneumococcal meningitis and bacteremia, depression, hypothyroid, IDDM2, presents to the ED for chief concern of altered mentation.   At bedside, she had expressive aphasia, right upper and lower extremity weakness. Confusion today per spouse. Can walk with a walker and assistance. Jumbled words. She has baseline weakness in the right arm compared to left arm.    Spouse reports that patient had poor PO intake for more than a week. Spouse endorses vomiting and dnies blood and black coffee ground substances.   Patient has been unable to tolerate the MRI due to severe claustrophobia.  Will attempt again with help of IV Ativan.  Lumbar puncture was performed, initial results not terribly impressive.  Not overtly concerning for meningitis.  Remains on broad-spectrum IV antibiotics and IV acyclovir.  Infectious disease consulted.  Concern for possible manifestation of underlying hematologic malignancy.  I have requested consultation from oncology.  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  She sees oncology only once yearly.  Patient was evaluated by oncology.  Very unlikely that current presentation is a manifestation of underlying CLL.  No clear infectious signs.  All cultures remain negative.  LP is inconsistent with meningitis.  CT head chest abdomen pelvis significant surprisingly for pancreatitis.  Lipase is elevated.     Assessment & Plan:   Principal Problem:   Altered mental status Active Problems:   DDD (degenerative disc disease), cervical   Aphasia   Anemia of chronic disease   Acute focal neurological deficit   Encephalitis   Encephalopathy acute  Acute pancreatitis Unclear etiology No reported history of alcohol intake, no radiographic evidence of gallstones Possible  drug-induced however unclear exactly which drug could have driven this Initial abdominal examination negative for tenderness On interview patient does endorse left upper quadrant tenderness Lipase downtrending Triglycerides negative Plan: Soft diet as tolerated Trend lipase IV fluids As needed pain control, avoid narcotics  Acute encephalopathy Unclear etiology Differentials include meningitis, viral versus bacterial versus aseptic Also CVA, hematologic malignancy, brain abscess Infectious disease on consult LP not indicative of bacterial meningitis Consideration for recurrence of underlying hematologic malignancy Unfortunately patient unable to tolerate MRI Oncology consultation appreciated, no clinical evidence of CLL recurrence 7/30: Low-grade temp 100.4 Plan: Unclear whether there is infection.  Very little indication for any bacterial infection of viral meningitis difficult to exclude.  We will continue IV acyclovir for next 24 hours, reevaluate tomorrow.  May be psychiatric component.  Consider reaching out to psychiatry for recommendations.  Pancytopenia History of CLL All 3 cell lines acutely depressed Oncology consulted Patient's CLL has been in remission since 2016 Recurrence of hematologic note malignancy felt unlikely Plan: Daily CBC with differential Appreciate oncology evaluation  Marked hypokalemia Suspect secondary to vomiting and poor p.o. intake Improved after replacement Check daily labs  Oral thrush Patient with clinical indications of thrush Associated with difficulty swallowing Seems to be improving Plan: Diflucan 150 mg p.o. daily Nystatin swish and swallow 4 times daily  Insulin-dependent diabetes mellitus Patient is on long-acting formulation 50 units daily at home in addition to metformin This is on hold Plan: Glargine 35 units daily Sliding scale coverage Carb modified diet Diabetes coordinator consult  Hyperlipidemia PTA  statin  Hypothyroid PTA Synthroid   DVT prophylaxis: TED hose Code Status: Full code Family Communication:  Spouse Tonya Myers [phone] on 7/29, 7/30 Disposition Plan: Status is: Inpatient  Remains inpatient appropriate because:Inpatient level of care appropriate due to severity of illness  Dispo: The patient is from: Home              Anticipated d/c is to: Home              Patient currently is not medically stable to d/c.   Difficult to place patient No  Acute encephalopathy of unclear etiology.  Pancreatitis.  No clear evidence of CLL or hematologic malignancy.  Disposition plan is unclear.     Level of care: Med-Surg  Consultants:  ID Oncology  Procedures:  Lumbar puncture 7/28  Antimicrobials:    Subjective: Patient seen and examined.  Is more awake this morning still confused.  Answers questions appropriately.  Objective: Vitals:   11/22/20 2017 11/22/20 2327 11/23/20 0339 11/23/20 0728  BP: (!) 155/59 (!) 167/92 (!) 181/93 (!) 145/86  Pulse: (!) 49 (!) 51 (!) 48 (!) 55  Resp: 17 18 17 16   Temp: 98.7 F (37.1 C) 97.8 F (36.6 C) 98.2 F (36.8 C) 98.2 F (36.8 C)  TempSrc: Oral Oral  Oral  SpO2: 96% 95% 99% 99%  Weight:      Height:        Intake/Output Summary (Last 24 hours) at 11/23/2020 1031 Last data filed at 11/23/2020 0731 Gross per 24 hour  Intake 2533.4 ml  Output 2025 ml  Net 508.4 ml   Filed Weights   11/20/20 0507  Weight: 83 kg    Examination:  General exam: No acute distress.  Confused Respiratory system: Lungs clear.  Normal work of breathing.  Room air Cardiovascular system: S1-S2, regular rate and rhythm, no murmurs, no pedal edema  gastrointestinal system: Soft, nondistended, mild TTP LUQ, normal bowel sounds  Central nervous system: Alert, oriented x2, confused, no focal deficits Extremities: Decreased power symmetrically Skin: No obvious rashes or lesions Psychiatry: Judgement and insight appear impaired. Mood & affect  flattened.     Data Reviewed: I have personally reviewed following labs and imaging studies  CBC: Recent Labs  Lab 11/19/20 1648 11/20/20 0955 11/21/20 0525 11/22/20 0650 11/23/20 0556  WBC 2.4* 2.3* 3.6* 8.0 10.9*  NEUTROABS  --  1.4* 2.5 6.2 8.6*  HGB 9.2* 8.8* 8.1* 8.7* 9.4*  HCT 29.4* 27.9* 25.6* 26.8* 29.4*  MCV 83.1 84.5 83.1 82.2 82.4  PLT 72* 63* 72* 75* 67*   Basic Metabolic Panel: Recent Labs  Lab 11/19/20 1648 11/20/20 0602 11/20/20 0737 11/21/20 0525 11/22/20 0650 11/23/20 0556  NA 141  --  139 140 136 136  K 2.8*  --  3.4* 2.5* 2.9* 4.4  CL 105  --  104 107 103 101  CO2 25  --  23 28 28 29   GLUCOSE 157*  --  266* 229* 252* 206*  BUN 9  --  11 8 8 9   CREATININE 0.75  --  0.59 0.50 0.49 0.51  CALCIUM 8.3*  --  7.6* 8.1* 7.9* 8.1*  MG  --  1.6*  --   --   --   --    GFR: Estimated Creatinine Clearance: 90.6 mL/min (by C-G formula based on SCr of 0.51 mg/dL). Liver Function Tests: Recent Labs  Lab 11/19/20 1648 11/20/20 0737 11/21/20 0525 11/22/20 0650 11/23/20 0556  AST 54* 42* 31 33 28  ALT 5 <5 <5 <5 5  ALKPHOS 68 59 56 52 58  BILITOT 1.3* 0.9 0.5  0.7 0.9  PROT 7.1 6.4* 6.8 6.3* 6.3*  ALBUMIN 2.7* 2.3* 2.4* 2.5* 2.5*   Recent Labs  Lab 11/21/20 1800 11/23/20 0556  LIPASE 633* 541*   Recent Labs  Lab 11/20/20 0545  AMMONIA 17   Coagulation Profile: Recent Labs  Lab 11/20/20 0602  INR 1.0   Cardiac Enzymes: No results for input(s): CKTOTAL, CKMB, CKMBINDEX, TROPONINI in the last 168 hours. BNP (last 3 results) No results for input(s): PROBNP in the last 8760 hours. HbA1C: No results for input(s): HGBA1C in the last 72 hours. CBG: Recent Labs  Lab 11/22/20 1546 11/22/20 2019 11/22/20 2324 11/23/20 0341 11/23/20 0729  GLUCAP 223* 200* 194* 188* 192*   Lipid Profile: Recent Labs    11/22/20 0650  TRIG 143   Thyroid Function Tests: No results for input(s): TSH, T4TOTAL, FREET4, T3FREE, THYROIDAB in the last 72  hours.  Anemia Panel: Recent Labs    11/21/20 1650  VITAMINB12 761  FOLATE 13.2  FERRITIN 1,032*  TIBC 147*  IRON 46   Sepsis Labs: Recent Labs  Lab 11/19/20 1648 11/19/20 1846 11/20/20 0737  PROCALCITON  --   --  0.41  LATICACIDVEN 2.0* 1.9  --     Recent Results (from the past 240 hour(s))  Blood culture (routine x 2)     Status: None (Preliminary result)   Collection Time: 11/19/20  4:47 PM   Specimen: BLOOD  Result Value Ref Range Status   Specimen Description BLOOD RIGHT ANTECUBITAL  Final   Special Requests   Final    BOTTLES DRAWN AEROBIC AND ANAEROBIC Blood Culture adequate volume   Culture   Final    NO GROWTH 2 DAYS Performed at [facility], [address], [place], [place] [address]    Report Status PENDING  Incomplete  Resp Panel by RT-PCR (Flu A&B, Covid) Nasopharyngeal Swab     Status: None   Collection Time: 11/19/20  4:48 PM   Specimen: Nasopharyngeal Swab; Nasopharyngeal(NP) swabs in vial transport medium  Result Value Ref Range Status   SARS Coronavirus 2 by RT PCR NEGATIVE NEGATIVE Final    Comment: (NOTE) SARS-CoV-2 target nucleic acids are NOT DETECTED.  The SARS-CoV-2 RNA is generally detectable in upper respiratory specimens during the acute phase of infection. The lowest concentration of SARS-CoV-2 viral copies this assay can detect is 138 copies/mL. A negative result does not preclude SARS-Cov-2 infection and should not be used as the sole basis for treatment or other patient management decisions. A negative result may occur with  improper specimen collection/handling, submission of specimen other than nasopharyngeal swab, presence of viral mutation(s) within the areas targeted by this assay, and inadequate number of viral copies(<138 copies/mL). A negative result must be combined with clinical observations, patient history, and epidemiological information. The expected result is Negative.  Fact Sheet for Patients:   EntrepreneurPulse.com.au  Fact Sheet for Healthcare Providers:  IncredibleEmployment.be  This test is no t yet approved or cleared by the [place] FDA and  has been authorized for detection and/or diagnosis of SARS-CoV-2 by FDA under an Emergency Use Authorization (EUA). This EUA will remain  in effect (meaning this test can be used) for the duration of the COVID-19 declaration under Section 564(b)(1) of the Act, 21 U.S.C.section 360bbb-3(b)(1), unless the authorization is terminated  or revoked sooner.       Influenza A by PCR NEGATIVE NEGATIVE Final   Influenza B by PCR NEGATIVE NEGATIVE Final    Comment: (NOTE) The Xpert  Xpress SARS-CoV-2/FLU/RSV plus assay is intended as an aid in the diagnosis of influenza from Nasopharyngeal swab specimens and should not be used as a sole basis for treatment. Nasal washings and aspirates are unacceptable for Xpert Xpress SARS-CoV-2/FLU/RSV testing.  Fact Sheet for Patients: EntrepreneurPulse.com.au  Fact Sheet for Healthcare Providers: IncredibleEmployment.be  This test is not yet approved or cleared by the [place] FDA and has been authorized for detection and/or diagnosis of SARS-CoV-2 by FDA under an Emergency Use Authorization (EUA). This EUA will remain in effect (meaning this test can be used) for the duration of the COVID-19 declaration under Section 564(b)(1) of the Act, 21 U.S.C. section 360bbb-3(b)(1), unless the authorization is terminated or revoked.  Performed at [facility], [address] [place], [place] [address]   Blood culture (routine x 2)     Status: None (Preliminary result)   Collection Time: 11/19/20  6:46 PM   Specimen: BLOOD  Result Value Ref Range Status   Specimen Description BLOOD RIGHT ANTECUBITAL  Final   Special Requests   Final    BOTTLES DRAWN AEROBIC AND ANAEROBIC Blood Culture adequate volume   Culture    Final    NO GROWTH 2 DAYS Performed at [facility], [address] [place], [name] [address]    Report Status PENDING  Incomplete  Urine Culture     Status: Abnormal   Collection Time: 11/19/20  9:33 PM   Specimen: Urine, Clean Catch  Result Value Ref Range Status   Specimen Description   Final    URINE, CLEAN CATCH Performed at [facility], [address] [place], [place] [address]    Special Requests   Final    NONE Performed at [facility], [address], [place], [place] [address]    Culture (A)  Final    <10,000 COLONIES/mL INSIGNIFICANT GROWTH Performed at [place] [address], [place], [place] [address]    Report Status 11/21/2020 FINAL  Final  Culture, fungus without smear     Status: None (Preliminary result)   Collection Time: 11/20/20 11:09 AM   Specimen: CSF; Cerebrospinal Fluid  Result Value Ref Range Status   Specimen Description   Final    CSF Performed at [facility], [address] [place], [place] [address]    Special Requests   Final    NONE Performed at [facility], [address], [place], [place] [address]    Culture   Final    NO FUNGUS ISOLATED AFTER 2 DAYS Performed at [facility], [place] [address] [place], [place] [address]    Report Status PENDING  Incomplete  CSF culture w Gram Stain     Status: None (Preliminary result)   Collection Time: 11/20/20 11:09 AM   Specimen: PATH Cytology CSF; Cerebrospinal Fluid  Result Value Ref Range Status   Specimen Description   Final    CSF Performed at [facility], [address] [place], [place] [address]    Special Requests   Final    NONE Performed at [facility], [address] [place], [place] [address]    Gram Stain   Final    NO ORGANISMS SEEN WBC SEEN RED BLOOD CELLS PRESENT Performed at [facility], [address], [place], [place] [address]    Culture   Final    NO GROWTH  3 DAYS Performed at [facility], [place] [address], [place], [place]  [address]    Report Status PENDING  Incomplete         Radiology Studies: CT HEAD W & WO CONTRAST  Result Date: 11/21/2020 CLINICAL DATA:  Metastatic disease evaluation. Additional provided: Patient with history of recent pneumococcal meningitis and bacteremia, diabetes mellitus presenting with altered mental status. EXAM: CT HEAD WITHOUT AND WITH CONTRAST TECHNIQUE: Contiguous axial images were obtained from the base of the skull through the vertex without and with intravenous contrast CONTRAST:  181mL OMNIPAQUE IOHEXOL 350 MG/ML SOLN COMPARISON:  Prior head CT examinations 11/19/2020 and earlier. FINDINGS: Brain: Cerebral volume is normal for age. There is no acute intracranial hemorrhage. No demarcated cortical infarct. No extra-axial fluid collection. No evidence of an intracranial mass. No midline shift. No pathologic intracranial enhancement is identified. Vascular: Atherosclerotic calcifications. Skull: Normal. Negative for fracture or focal suspicious osseous lesion. Sinuses/Orbits: Visualized orbits show no acute finding. Minimal frothy secretions within the left sphenoid sinus. Small right sphenoid sinus mucous retention cyst. Minimal partial opacification of the posterior right ethmoid air cells. IMPRESSION: No evidence of acute intracranial abnormality. No CT evidence of intracranial metastatic disease. Mild paranasal sinus disease at the imaged levels, as described. Electronically Signed   By: [name] DO   On: 11/21/2020 15:42   CT CHEST ABDOMEN PELVIS W CONTRAST  Result Date: 11/21/2020 CLINICAL DATA:  Altered mental status. Recent history of pneumococcal meningitis and bacteremia. Coffee ground emesis. EXAM: CT CHEST, ABDOMEN, AND PELVIS WITH CONTRAST TECHNIQUE: Multidetector CT imaging of the chest, abdomen and pelvis was performed following the standard protocol during bolus administration of intravenous  contrast. CONTRAST:  100 mL OMNIPAQUE IOHEXOL 350 MG/ML SOLN COMPARISON:  CT abdomen and pelvis 05/23/2020. FINDINGS: CT CHEST FINDINGS Cardiovascular: There is mild cardiomegaly and a small pericardial effusion. No atherosclerosis is seen. Mediastinum/Nodes: Innumerable axillary and subpectoral lymph nodes are new since the prior chest CT. Index node in the right axilla measuring 1 cm short axis dimension is seen on image 21 of series 3. A left subpectoral node measuring 1.3 cm is seen on image 24. Small mediastinal lymph nodes include a precarinal node on image 27 measuring 1 cm. The esophagus and thyroid are negative. Lungs/Pleura: There is some dependent atelectasis. A 0.7 x 0.5 cm right upper lobe nodule on image 72 of series 6 measure approximately 0.7 x 0.4 cm on the prior exam. There is extensive micronodularity in the right upper lobe. Musculoskeletal: No acute or focal abnormality CT ABDOMEN PELVIS FINDINGS Hepatobiliary: No focal liver abnormality is seen. No gallstones, gallbladder wall thickening, or biliary dilatation. The liver is diffusely low attenuating consistent with marked fatty infiltration. Pancreas: [place] is present about the pancreas throughout is consistent with acute pancreatitis. No focal fluid collection. The pancreas enhances homogeneously. Spleen: Normal in size without focal abnormality. Adrenals/Urinary Tract: The adrenal glands appear normal. A single small cyst is seen in the left kidney. On delayed imaging, there are areas of decreased attenuation in both kidneys which may be artifactual. No hydronephrosis. No ureteral or urinary bladder stone. Stomach/Bowel: Stomach is within normal limits. Appendix appears normal. No evidence of bowel wall thickening, distention, or inflammatory changes. Vascular/Lymphatic: No significant vascular findings are present. No enlarged abdominal or pelvic lymph nodes. Reproductive: Status post hysterectomy. No adnexal masses. Other: None.  Musculoskeletal: No acute or focal bony abnormality. IMPRESSION: The examination is positive for findings consistent with acute pancreatitis without pseudocyst formation or pancreatic necrosis. Micronodularity in the right upper lobe is worrisome for bronchopneumonia. 0.7 x  0.5 cm right upper lobe nodule has mildly increased in size since 2017. Non-contrast chest CT at 6-12 months is recommended. If the nodule is stable at time of repeat CT, then future CT at 18-24 months (from today's scan) is considered optional for low-risk patients, but is recommended for high-risk patients. This recommendation follows the consensus statement: Guidelines for Management of Incidental Pulmonary Nodules Detected on CT Images: From the Fleischner Society 2017; Radiology 2017; 284:228-243. Multiple axillary and subpectoral lymph nodes are abnormal in number and likely related to this patient's history of CLL. Cardiomegaly and small pericardial effusion. Marked fatty infiltration of the liver. Decreased cortical attenuation the kidneys is likely artifactual. Correlation with urinalysis could be used to exclude pyelonephritis. Electronically Signed   By: Inge Rise M.D.   On: 11/21/2020 16:55        Scheduled Meds:  acetaminophen  1,000 mg Oral Once   chlorhexidine  15 mL Mouth Rinse BID   diltiazem  120 mg Oral Daily   fluconazole  150 mg Oral Daily   hydrALAZINE  50 mg Oral Q8H   insulin aspart  0-15 Units Subcutaneous Q4H   insulin glargine-yfgn  35 Units Subcutaneous Daily   levothyroxine  100 mcg Oral QAC breakfast   lidocaine (PF)  5 mL Other Once   magic mouthwash w/lidocaine  15 mL Oral TID   mouth rinse  15 mL Mouth Rinse q12n4p   nystatin  5 mL Oral QID   pantoprazole  40 mg Oral Daily   rosuvastatin  10 mg Oral QHS   Continuous Infusions:  acyclovir     lactated ringers with kcl 100 mL/hr at 11/23/20 0644     LOS: 3 days    Time spent: 25 minutes    Sidney Ace, MD [facility] Pager [phone]  If 7PM-7AM, please contact night-coverage 11/23/2020, 10:31 AM

## 2020-11-23 NOTE — Consult Note (Addendum)
Pharmacy Antibiotic Note  Tonya Verno is a 55 y.o. 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 Pt has a recent history for pneumococcal meningitis and bacteremia (05/2020). Pharmacy has been consulted for Vancomycin/Acyclovir dosing.  Plan: Monitor fever curve over 24hrs w/ acyclovir restart 7/31 and off abx since 7/30 per d/w MD. Reassess on 8/01.  Ceftriaxone & Vancomycin: (7/28 LP CSF w/ elevated protein and glucose. Considering viral > bac) Ceftriaxone 2g q12h and Vanco 1g q12h [stopped] Acyclovir: Acyclovir was stopped 7/30 in response to negative HSV 1/2 CSF, however WBC rose, low-grade fever overnight, some potential for false-negatives (repeat collection at 3-7d), pt couldn't tolerate MRI, & CT not good for temporal enhancement. Will resume Acyclovir 830 mg q8h (used actual body weight since BMI not >30) and continue LR @100ml /hr to help prevent renal toxicity.  Monitor renal function and adjust doses as clinically indicated  Height: 6' (182.9 cm) Weight: 83 kg (182 lb 15.7 oz) IBW/kg (Calculated) : 73.1  Temp (24hrs), Avg:98.5 F (36.9 C), Min:97.8 F (36.6 C), Max:100.4 F (38 C)  Recent Labs  Lab 11/19/20 1648 11/19/20 1846 11/20/20 0737 11/20/20 0955 11/21/20 0525 11/22/20 0650 11/23/20 0556  WBC 2.4*  --   --  2.3* 3.6* 8.0 10.9*  CREATININE 0.75  --  0.59  --  0.50 0.49 0.51  LATICACIDVEN 2.0* 1.9  --   --   --   --   --      Estimated Creatinine Clearance: 90.6 mL/min (by C-G formula based on SCr of 0.51 mg/dL).    Allergies  Allergen Reactions   Amoxapine    Penicillins     Antimicrobials this admission: Cefepime x 1 7/27 Ceftriaxone 7/28-30 Vancomycin 7/28-30 Acyclovir (7/28-30); (7/31>>  Dose adjustments this admission: 7/28 acyclovir  Microbiology results: 7/27 BCx: NGTD 7/27 UCx: <10k CFUs insignificant growth 7/28 HSV 1/2 CSF - negative/negative 7/28 CSF fungal smear / gram stain - NGTD 7/28 COVID/FLU  NEG 7/31 HSV 1/2 CSF - repeat ordered.  Thank you for allowing pharmacy to be a part of this patient's care.  Lorna Dibble, PharmD Clinical Pharmacist 11/23/2020 7:58 AM

## 2020-11-24 DIAGNOSIS — R410 Disorientation, unspecified: Secondary | ICD-10-CM

## 2020-11-24 DIAGNOSIS — R4701 Aphasia: Secondary | ICD-10-CM | POA: Diagnosis not present

## 2020-11-24 LAB — VDRL, CSF: VDRL Quant, CSF: NONREACTIVE

## 2020-11-24 LAB — COMPREHENSIVE METABOLIC PANEL
ALT: <5 U/L (ref 0–44)
AST: 23 U/L (ref 15–41)
Albumin: 2.5 g/dL — ABNORMAL LOW (ref 3.5–5.0)
Alkaline Phosphatase: 58 U/L (ref 38–126)
Anion gap: 3 — ABNORMAL LOW (ref 5–15)
BUN: 8 mg/dL (ref 6–20)
CO2: 26 mmol/L (ref 22–32)
Calcium: 7.9 mg/dL — ABNORMAL LOW (ref 8.9–10.3)
Chloride: 106 mmol/L (ref 98–111)
Creatinine, Ser: 0.56 mg/dL (ref 0.44–1.00)
GFR, Estimated: >60 mL/min (ref >60)
Glucose, Bld: 91 mg/dL (ref 70–99)
Potassium: 4.7 mmol/L (ref 3.5–5.1)
Sodium: 135 mmol/L (ref 135–145)
Total Bilirubin: 0.7 mg/dL (ref 0.3–1.2)
Total Protein: 6.1 g/dL — ABNORMAL LOW (ref 6.5–8.1)

## 2020-11-24 LAB — CBC WITH DIFFERENTIAL/PLATELET
Abs Immature Granulocytes: 0.08 10*3/uL — ABNORMAL HIGH (ref 0.00–0.07)
Basophils Absolute: 0 10*3/uL (ref 0.0–0.1)
Basophils Relative: 0 %
Eosinophils Absolute: 0 10*3/uL (ref 0.0–0.5)
Eosinophils Relative: 0 %
HCT: 30.4 % — ABNORMAL LOW (ref 36.0–46.0)
Hemoglobin: 9.7 g/dL — ABNORMAL LOW (ref 12.0–15.0)
Immature Granulocytes: 1 %
Lymphocytes Relative: 16 %
Lymphs Abs: 1.6 10*3/uL (ref 0.7–4.0)
MCH: 25.9 pg — ABNORMAL LOW (ref 26.0–34.0)
MCHC: 31.9 g/dL (ref 30.0–36.0)
MCV: 81.3 fL (ref 80.0–100.0)
Monocytes Absolute: 0.4 10*3/uL (ref 0.1–1.0)
Monocytes Relative: 4 %
Neutro Abs: 7.9 10*3/uL — ABNORMAL HIGH (ref 1.7–7.7)
Neutrophils Relative %: 79 %
Platelets: 59 10*3/uL — ABNORMAL LOW (ref 150–400)
RBC: 3.74 MIL/uL — ABNORMAL LOW (ref 3.87–5.11)
RDW: 16.8 % — ABNORMAL HIGH (ref 11.5–15.5)
WBC: 10 10*3/uL (ref 4.0–10.5)
nRBC: 0 % (ref 0.0–0.2)

## 2020-11-24 LAB — CULTURE, BLOOD (ROUTINE X 2)
Culture: NO GROWTH
Culture: NO GROWTH
Special Requests: ADEQUATE
Special Requests: ADEQUATE

## 2020-11-24 LAB — URINALYSIS, ROUTINE W REFLEX MICROSCOPIC
Bilirubin Urine: NEGATIVE
Glucose, UA: NEGATIVE mg/dL
Ketones, ur: NEGATIVE mg/dL
Leukocytes,Ua: NEGATIVE
Nitrite: NEGATIVE
Protein, ur: 100 mg/dL — AB
RBC / HPF: >50 RBC/hpf — ABNORMAL HIGH (ref 0–5)
Specific Gravity, Urine: 1.011 (ref 1.005–1.030)
pH: 9 — ABNORMAL HIGH (ref 5.0–8.0)

## 2020-11-24 LAB — LIPASE, BLOOD: Lipase: 305 U/L — ABNORMAL HIGH (ref 11–51)

## 2020-11-24 LAB — GLUCOSE, CAPILLARY
Glucose-Capillary: 112 mg/dL — ABNORMAL HIGH (ref 70–99)
Glucose-Capillary: 115 mg/dL — ABNORMAL HIGH (ref 70–99)
Glucose-Capillary: 126 mg/dL — ABNORMAL HIGH (ref 70–99)
Glucose-Capillary: 144 mg/dL — ABNORMAL HIGH (ref 70–99)
Glucose-Capillary: 86 mg/dL (ref 70–99)
Glucose-Capillary: 87 mg/dL (ref 70–99)
Glucose-Capillary: 98 mg/dL (ref 70–99)

## 2020-11-24 MED ORDER — LACTATED RINGERS IV SOLN: INTRAVENOUS | Status: DC

## 2020-11-24 MED ORDER — MIRTAZAPINE 15 MG PO TABS
15.0000 mg | ORAL_TABLET | Freq: Every day | ORAL | Status: DC
  Administered 2020-11-24 – 2020-12-20 (×27): 15 mg via ORAL
  Filled 2020-11-24 (×28): qty 1

## 2020-11-24 MED ORDER — FLUCONAZOLE 100 MG PO TABS
100.0000 mg | ORAL_TABLET | Freq: Every day | ORAL | Status: AC
  Administered 2020-11-25 – 2020-11-27 (×3): 100 mg via ORAL
  Filled 2020-11-24 (×3): qty 1

## 2020-11-24 NOTE — Progress Notes (Signed)
PROGRESS NOTE    Tonya Myers  ZOX:[id number] DOB: 1965-12-29 DOA: 11/19/2020 PCP: [name], MD   Brief Narrative:  55 y.o. female with medical history significant for recent pneumococcal meningitis and bacteremia, depression, hypothyroid, IDDM2, presents to the ED for chief concern of altered mentation.   At bedside, she had expressive aphasia, right upper and lower extremity weakness. Confusion today per spouse. Can walk with a walker and assistance. Jumbled words. She has baseline weakness in the right arm compared to left arm.    Spouse reports that patient had poor PO intake for more than a week. Spouse endorses vomiting and dnies blood and black coffee ground substances.   Patient has been unable to tolerate the MRI due to severe claustrophobia.  Will attempt again with help of IV Ativan.  Lumbar puncture was performed, initial results not terribly impressive.  Not overtly concerning for meningitis.  Remains on broad-spectrum IV antibiotics and IV acyclovir.  Infectious disease consulted.  Concern for possible manifestation of underlying hematologic malignancy.  I have requested consultation from oncology.  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  She sees oncology only once yearly.  Patient was evaluated by oncology.  Very unlikely that current presentation is a manifestation of underlying CLL.  No clear infectious signs.  All cultures remain negative.  LP is inconsistent with meningitis.  CT head chest abdomen pelvis significant surprisingly for pancreatitis.  Lipase is elevated.  Patient has been responding to treatment for pancreatitis including bowel rest and IV fluids.  Oral thrush is responding to treatment.  Mental status remains poor.  Appetite remains poor.  When inquired about why she is not eating she denies throat or abdominal pain and states just poor appetite.     Assessment & Plan:   Principal Problem:   Altered mental  status Active Problems:   DDD (degenerative disc disease), cervical   Aphasia   Anemia of chronic disease   Acute focal neurological deficit   Encephalitis   Encephalopathy acute  Acute pancreatitis Unclear etiology No reported history of alcohol intake, no radiographic evidence of gallstones Possible drug-induced however unclear exactly which drug could have driven this Initial abdominal examination negative for tenderness On interview patient does endorse left upper quadrant tenderness Lipase downtrending Triglycerides negative Plan: Continue with soft diet as needed.  Continue trending lipase.  Continue IV fluids.  As needed pain control, avoid narcotics.  Start nightly primary for appetite stimulation.  Dietitian consult requested.  Acute encephalopathy Unclear etiology Differentials include meningitis, viral versus bacterial versus aseptic Also CVA, hematologic malignancy, brain abscess Infectious disease on consult LP not indicative of bacterial meningitis Consideration for recurrence of underlying hematologic malignancy Unfortunately patient unable to tolerate MRI Oncology consultation appreciated, no clinical evidence of CLL recurrence 7/30: Low-grade temp 100.4 Plan: No true clinical indication of infection.  Can continue IV acyclovir for now however will likely discontinue within the next 24 hours if patient remains stable and afebrile.  Regarding encephalopathy concerned there may be a psychiatric component at play.  Psychiatry consult requested, recommendations appreciated.  Pancytopenia History of CLL All 3 cell lines acutely depressed Oncology consulted Patient's CLL has been in remission since 2016 Recurrence of hematologic note malignancy felt unlikely Plan: Daily CBC with differential Appreciate oncology evaluation  Marked hypokalemia Suspect secondary to vomiting and poor p.o. intake Improved after replacement Check daily labs  Oral thrush Patient  with clinical indications of thrush Associated with difficulty swallowing Seems to  be improving Patient states poor p.o. intake is driven by appetite rather than pain Plan: Continue nystatin swish and swallow, Decrease dose of Diflucan 100 daily  Insulin-dependent diabetes mellitus Patient is on long-acting formulation 50 units daily at home in addition to metformin This is on hold Plan: Glargine 35 units daily Sliding scale coverage Carb modified diet Diabetes coordinator consult  Hyperlipidemia PTA statin  Hypothyroid PTA Synthroid   DVT prophylaxis: TED hose Code Status: Full code Family Communication: [name] [phone] on 7/29, 7/30, 8/1 Disposition Plan: Status is: Inpatient  Remains inpatient appropriate because:Inpatient level of care appropriate due to severity of illness  Dispo: The patient is from: Home              Anticipated d/c is to: Home              Patient currently is not medically stable to d/c.   Difficult to place patient No  Acute encephalopathy, poor p.o. intake, pancreatitis.  No clear evidence of CLL hematologic malignancy.  No clear evidence of infection.  Disposition is unclear at this point.     Level of care: Med-Surg  Consultants:  ID Oncology  Procedures:  Lumbar puncture 7/28  Antimicrobials:    Subjective: Patient seen and examined.  More awake.  Remains confused.  Answers questions appropriately.  Unable to provide substantial history.  Objective: Vitals:   11/23/20 1548 11/23/20 1941 11/24/20 0436 11/24/20 0730  BP: (!) 151/90 (!) 152/93 (!) 144/96 131/85  Pulse: (!) 59 62 69 81  Resp: 16 16 16 18   Temp: 98.2 F (36.8 C) 99.5 F (37.5 C) 98.2 F (36.8 C) 99 F (37.2 C)  TempSrc: Oral Oral    SpO2: 97% 98% 98% 99%  Weight:      Height:        Intake/Output Summary (Last 24 hours) at 11/24/2020 1044 Last data filed at 11/24/2020 0730 Gross per 24 hour  Intake 3020.92 ml  Output 1550 ml  Net 1470.92  ml   Filed Weights   11/20/20 0507  Weight: 83 kg    Examination:  General exam: No acute distress.  Confused Respiratory system: Lungs clear.  Normal work of breathing.  Room air Cardiovascular system: S1-S2, regular rate and rhythm, no murmurs, no pedal edema  gastrointestinal system: Soft, nondistended, mild TTP LUQ, normal bowel sounds Central nervous system: Alert, oriented x2, confused, no focal deficits Extremities: Decreased power symmetrically Skin: No obvious rashes or lesions Psychiatry: Judgement and insight appear impaired. Mood & affect flattened.     Data Reviewed: I have personally reviewed following labs and imaging studies  CBC: Recent Labs  Lab 11/20/20 0955 11/21/20 0525 11/22/20 0650 11/23/20 0556 11/24/20 0440  WBC 2.3* 3.6* 8.0 10.9* 10.0  NEUTROABS 1.4* 2.5 6.2 8.6* 7.9*  HGB 8.8* 8.1* 8.7* 9.4* 9.7*  HCT 27.9* 25.6* 26.8* 29.4* 30.4*  MCV 84.5 83.1 82.2 82.4 81.3  PLT 63* 72* 75* 67* 59*   Basic Metabolic Panel: Recent Labs  Lab 11/20/20 0602 11/20/20 0737 11/21/20 0525 11/22/20 0650 11/23/20 0556 11/24/20 0440  NA  --  139 140 136 136 135  K  --  3.4* 2.5* 2.9* 4.4 4.7  CL  --  104 107 103 101 106  CO2  --  23 28 28 29 26   GLUCOSE  --  266* 229* 252* 206* 91  BUN  --  11 8 8 9 8   CREATININE  --  0.59 0.50 0.49 0.51 0.56  CALCIUM  --  7.6* 8.1* 7.9* 8.1* 7.9*  MG 1.6*  --   --   --   --   --    GFR: Estimated Creatinine Clearance: 90.6 mL/min (by C-G formula based on SCr of 0.56 mg/dL). Liver Function Tests: Recent Labs  Lab 11/20/20 0737 11/21/20 0525 11/22/20 0650 11/23/20 0556 11/24/20 0440  AST 42* 31 33 28 23  ALT <5 <5 <5 5 <5  ALKPHOS 59 56 52 58 58  BILITOT 0.9 0.5 0.7 0.9 0.7  PROT 6.4* 6.8 6.3* 6.3* 6.1*  ALBUMIN 2.3* 2.4* 2.5* 2.5* 2.5*   Recent Labs  Lab 11/21/20 1800 11/23/20 0556 11/24/20 0440  LIPASE 633* 541* 305*   Recent Labs  Lab 11/20/20 0545  AMMONIA 17   Coagulation Profile: Recent  Labs  Lab 11/20/20 0602  INR 1.0   Cardiac Enzymes: No results for input(s): CKTOTAL, CKMB, CKMBINDEX, TROPONINI in the last 168 hours. BNP (last 3 results) No results for input(s): PROBNP in the last 8760 hours. HbA1C: No results for input(s): HGBA1C in the last 72 hours. CBG: Recent Labs  Lab 11/23/20 1551 11/23/20 1942 11/24/20 0010 11/24/20 0438 11/24/20 0731  GLUCAP 212* 175* 144* 98 126*   Lipid Profile: Recent Labs    11/22/20 0650  TRIG 143   Thyroid Function Tests: No results for input(s): TSH, T4TOTAL, FREET4, T3FREE, THYROIDAB in the last 72 hours.  Anemia Panel: Recent Labs    11/21/20 1650  VITAMINB12 761  FOLATE 13.2  FERRITIN 1,032*  TIBC 147*  IRON 46   Sepsis Labs: Recent Labs  Lab 11/19/20 1648 11/19/20 1846 11/20/20 0737  PROCALCITON  --   --  0.41  LATICACIDVEN 2.0* 1.9  --     Recent Results (from the past 240 hour(s))  Blood culture (routine x 2)     Status: None   Collection Time: 11/19/20  4:47 PM   Specimen: BLOOD  Result Value Ref Range Status   Specimen Description BLOOD RIGHT ANTECUBITAL  Final   Special Requests   Final    BOTTLES DRAWN AEROBIC AND ANAEROBIC Blood Culture adequate volume   Culture   Final    NO GROWTH 5 DAYS Performed at [facility], [address] [place], [place] [address]    Report Status 11/24/2020 FINAL  Final  Resp Panel by RT-PCR (Flu A&B, Covid) Nasopharyngeal Swab     Status: None   Collection Time: 11/19/20  4:48 PM   Specimen: Nasopharyngeal Swab; Nasopharyngeal(NP) swabs in vial transport medium  Result Value Ref Range Status   SARS Coronavirus 2 by RT PCR NEGATIVE NEGATIVE Final    Comment: (NOTE) SARS-CoV-2 target nucleic acids are NOT DETECTED.  The SARS-CoV-2 RNA is generally detectable in upper respiratory specimens during the acute phase of infection. The lowest concentration of SARS-CoV-2 viral copies this assay can detect is 138 copies/mL. A negative result does not  preclude SARS-Cov-2 infection and should not be used as the sole basis for treatment or other patient management decisions. A negative result may occur with  improper specimen collection/handling, submission of specimen other than nasopharyngeal swab, presence of viral mutation(s) within the areas targeted by this assay, and inadequate number of viral copies(<138 copies/mL). A negative result must be combined with clinical observations, patient history, and epidemiological information. The expected result is Negative.  Fact Sheet for Patients:  EntrepreneurPulse.com.au  Fact Sheet for Healthcare Providers:  IncredibleEmployment.be  This test is no t yet approved or cleared by the [place] and  has been authorized for detection and/or diagnosis of SARS-CoV-2 by FDA under an Emergency Use Authorization (EUA). This EUA will remain  in effect (meaning this test can be used) for the duration of the COVID-19 declaration under Section 564(b)(1) of the Act, 21 U.S.C.section 360bbb-3(b)(1), unless the authorization is terminated  or revoked sooner.       Influenza A by PCR NEGATIVE NEGATIVE Final   Influenza B by PCR NEGATIVE NEGATIVE Final    Comment: (NOTE) The Xpert Xpress SARS-CoV-2/FLU/RSV plus assay is intended as an aid in the diagnosis of influenza from Nasopharyngeal swab specimens and should not be used as a sole basis for treatment. Nasal washings and aspirates are unacceptable for Xpert Xpress SARS-CoV-2/FLU/RSV testing.  Fact Sheet for Patients: EntrepreneurPulse.com.au  Fact Sheet for Healthcare Providers: IncredibleEmployment.be  This test is not yet approved or cleared by the [place] FDA and has been authorized for detection and/or diagnosis of SARS-CoV-2 by FDA under an Emergency Use Authorization (EUA). This EUA will remain in effect (meaning this test can be used) for the  duration of the COVID-19 declaration under Section 564(b)(1) of the Act, 21 U.S.C. section 360bbb-3(b)(1), unless the authorization is terminated or revoked.  Performed at [facility], [address] [place], [place] [address]   Blood culture (routine x 2)     Status: None   Collection Time: 11/19/20  6:46 PM   Specimen: BLOOD  Result Value Ref Range Status   Specimen Description BLOOD RIGHT ANTECUBITAL  Final   Special Requests   Final    BOTTLES DRAWN AEROBIC AND ANAEROBIC Blood Culture adequate volume   Culture   Final    NO GROWTH 5 DAYS Performed at [facility], [address] [place], [place] [address]    Report Status 11/24/2020 FINAL  Final  Urine Culture     Status: Abnormal   Collection Time: 11/19/20  9:33 PM   Specimen: Urine, Clean Catch  Result Value Ref Range Status   Specimen Description   Final    URINE, CLEAN CATCH Performed at [facility], [address] [place], [place] [address]    Special Requests   Final    NONE Performed at [facility], [address] [place], [place] [address]    Culture (A)  Final    <10,000 COLONIES/mL INSIGNIFICANT GROWTH Performed at [facility], [place] [address] [place], [place] [address]    Report Status 11/21/2020 FINAL  Final  Culture, fungus without smear     Status: None (Preliminary result)   Collection Time: 11/20/20 11:09 AM   Specimen: CSF; Cerebrospinal Fluid  Result Value Ref Range Status   Specimen Description   Final    CSF Performed at [facility], [address] [place], [place] [address]    Special Requests   Final    NONE Performed at [facility], [address], [place], [place] [address]    Culture   Final    NO FUNGUS ISOLATED AFTER 3 DAYS Performed at [facility], [place] [address] [place], [place] [address]    Report Status PENDING  Incomplete  CSF culture w Gram Stain     Status: None   Collection Time: 11/20/20  11:09 AM   Specimen: PATH Cytology CSF; Cerebrospinal Fluid  Result Value Ref Range Status   Specimen Description   Final    CSF Performed at [facility], [address], [place], [place] [address]  Special Requests   Final    NONE Performed at [facility], [address] [place], [place] [address]    Gram Stain   Final    NO ORGANISMS SEEN WBC SEEN RED BLOOD CELLS PRESENT Performed at [facility], [address] [place], [place] [address]    Culture   Final    NO GROWTH 3 DAYS Performed at [facility], [place] [address] [place], [place] [address]    Report Status 11/23/2020 FINAL  Final         Radiology Studies: No results found.      Scheduled Meds:  acetaminophen  1,000 mg Oral Once   chlorhexidine  15 mL Mouth Rinse BID   diltiazem  120 mg Oral Daily   fluconazole  150 mg Oral Daily   hydrALAZINE  50 mg Oral Q8H   insulin aspart  0-15 Units Subcutaneous Q4H   insulin glargine-yfgn  35 Units Subcutaneous Daily   levothyroxine  100 mcg Oral QAC breakfast   magic mouthwash w/lidocaine  15 mL Oral TID   mouth rinse  15 mL Mouth Rinse q12n4p   nystatin  5 mL Oral QID   pantoprazole  40 mg Oral Daily   rosuvastatin  10 mg Oral QHS   Continuous Infusions:  acyclovir Stopped (11/24/20 0559)   lactated ringers with kcl 100 mL/hr at 11/24/20 0258     LOS: 4 days    Time spent: 25 minutes    Sidney Ace, MD [facility] Pager [phone]  If 7PM-7AM, please contact night-coverage 11/24/2020, 10:44 AM

## 2020-11-24 NOTE — Consult Note (Signed)
[facility]   Reason for Consult: Consult for 55 year old woman with no known significant past psychiatric history currently in the hospital with a persistent altered mental status Referring Physician:  Priscella Mann Patient Identification: Tonya Myers MRN:  [id number] Principal Diagnosis: Subacute delirium Diagnosis:  Principal Problem:   Subacute delirium Active Problems:   DDD (degenerative disc disease), cervical   Aphasia   Anemia of chronic disease   Acute focal neurological deficit   Encephalitis   Altered mental status   Encephalopathy acute   Total Time spent with patient: 1 hour  Subjective:   Tonya Myers is a 55 y.o. female patient admitted with "did I have meningitis again?".  HPI: Patient seen chart reviewed.  Patient's husband was also present in the room and provided useful history.  This 55 year old woman with a complicated past medical history involving a history of chronic blood dyscrasia diabetes infections and a past history of meningitis was brought to the hospital 5 days ago by her husband with a report that for the past several days before that she had had a rapidly worsening mental state.  Husband reports the patient had not been eating very well for weeks which he attributes to her having thrush.  He says she was getting more confused at home.  She had a temperature that he quotes as 36 F at the time he brought her to the hospital.  Patient has had work-up so far including head CTs, lumbar puncture and other lab studies.  She has been found to have pancreatitis of unclear cause but so far no specific central nervous system pathology has been found.  Some of the tests from the lumbar puncture are still pending.  On interview found the patient awake with eyes open.  She engaged in conversation but was extremely slow.  Often would forget to answer questions part way through and never get to the final answer.  During the interview she was  looking back and forth around the room constantly.  Never maintained consistent eye contact.  She was able to say that she was at [facility].  She was not able to identify the date or year.  She was not able to describe any of the events that led to hospitalization.  She did say that she was currently having pain in her pancreas and pointed to the appropriate part of her body.  Was not able to describe any other acute symptoms very well.  When asked about emotional state seemed confused.  Denied feeling sad or depressed.  Denied having auditory or visual hallucinations.  I ask her to repeat 3 words immediately and she was able to do that but could recall none of them at 3 minutes.  She was able to identify 2 objects I showed her without difficulty.  I did not do any more formal or measurable cognitive testing with her.  Patient denied any suicidal thoughts.  Husband reports that up until a few days before admission she was at her normal mental status which she says was "normal" able to hold a conversation no problems with cognition.  Husband denies being aware of any recent mood symptoms.  Past Psychiatric History: I asked the patient about this and it was hard to follow her answer.  Initially she said yes but when follow-up questions were asked she appeared confused and changed her answer.  She seemed to be confusing it with questions about her past meningitis.  Depression is listed as a problem in  her chart but I could not find any specific reference to it.  Husband stated as far as he knew she had never had any mental health problems at all in the past.  Risk to Self:   Risk to Others:   Prior Inpatient Therapy:   Prior Outpatient Therapy:    Past Medical History:  Past Medical History:  Diagnosis Date   Acute hemorrhoid 03/11/2015   Anxiety    Arthritis    Asthma    Cancer ([place])    Chronic back pain    CLL (chronic lymphocytic leukemia) ([place])    Depression    Diabetes mellitus  without complication ([place])    Genital herpes    type 2   Hypertension    Hypothyroidism    Vertigo     Past Surgical History:  Procedure Laterality Date   ABDOMINAL HYSTERECTOMY     BILATERAL SALPINGOOPHORECTOMY  2009   BREAST BIOPSY Right 05/17/2016   FIBROADENOMATOUS CHANGE AND SCLEROSING ADENOSIS WITH   COLONOSCOPY WITH PROPOFOL N/A 06/16/2015   Procedure: COLONOSCOPY WITH PROPOFOL;  Surgeon: [name], MD;  Location: [facility] ENDOSCOPY;  Service: Endoscopy;  Laterality: N/A;   LAPAROSCOPIC SUPRACERVICAL HYSTERECTOMY  2009   due to [place] Bilateral 06/17/2020   Procedure: THORACIC LAMINECTOMY FOR EPIDURAL ABSCESS;  Surgeon: Deetta Perla, MD;  Location: [facility] ORS;  Service: Neurosurgery;  Laterality: Bilateral;   Family History:  Family History  Problem Relation Age of Onset   Cancer Paternal Aunt    Breast cancer Maternal Aunt 47   Family Psychiatric  History: Both patient and husband say they know of no family mental health history Social History:  Social History   Substance and Sexual Activity  Alcohol Use Yes   Alcohol/week: 1.0 standard drink   Types: 1 Cans of beer per week     Social History   Substance and Sexual Activity  Drug Use No    Social History   Socioeconomic History   Marital status: Single    Spouse name: Not on file   Number of children: Not on file   Years of education: Not on file   Highest education level: Not on file  Occupational History   Not on file  Tobacco Use   Smoking status: Never   Smokeless tobacco: Never  Vaping Use   Vaping Use: Never used  Substance and Sexual Activity   Alcohol use: Yes    Alcohol/week: 1.0 standard drink    Types: 1 Cans of beer per week   Drug use: No   Sexual activity: Not Currently    Birth control/protection: None  Other Topics Concern   Not on file  Social History Narrative   Not on file   Social Determinants of Health    Financial Resource Strain: Not on file  Food Insecurity: Not on file  Transportation Needs: Not on file  Physical Activity: Not on file  Stress: Not on file  Social Connections: Not on file   Additional Social History:    Allergies:   Allergies  Allergen Reactions   Amoxapine    Penicillins     Labs:  Results for orders placed or performed during the hospital encounter of 11/19/20 (from the past 48 hour(s))  Glucose, capillary     Status: Abnormal   Collection Time: 11/22/20  8:19 PM  Result Value Ref Range   Glucose-Capillary 200 (H) 70 - 99 mg/dL  Comment: Glucose reference range applies only to samples taken after fasting for at least 8 hours.   Comment 1 Notify RN   Glucose, capillary     Status: Abnormal   Collection Time: 11/22/20 11:24 PM  Result Value Ref Range   Glucose-Capillary 194 (H) 70 - 99 mg/dL    Comment: Glucose reference range applies only to samples taken after fasting for at least 8 hours.  Glucose, capillary     Status: Abnormal   Collection Time: 11/23/20  3:41 AM  Result Value Ref Range   Glucose-Capillary 188 (H) 70 - 99 mg/dL    Comment: Glucose reference range applies only to samples taken after fasting for at least 8 hours.  CBC with Differential/Platelet     Status: Abnormal   Collection Time: 11/23/20  5:56 AM  Result Value Ref Range   WBC 10.9 (H) 4.0 - 10.5 K/uL   RBC 3.57 (L) 3.87 - 5.11 MIL/uL   Hemoglobin 9.4 (L) 12.0 - 15.0 g/dL   HCT 29.4 (L) 36.0 - 46.0 %   MCV 82.4 80.0 - 100.0 fL   MCH 26.3 26.0 - 34.0 pg   MCHC 32.0 30.0 - 36.0 g/dL   RDW 16.4 (H) 11.5 - 15.5 %   Platelets 67 (L) 150 - 400 K/uL    Comment: Immature Platelet Fraction may be clinically indicated, consider ordering this additional test [id number]    nRBC 0.0 0.0 - 0.2 %   Neutrophils Relative % 78 %   Neutro Abs 8.6 (H) 1.7 - 7.7 K/uL   Lymphocytes Relative 15 %   Lymphs Abs 1.6 0.7 - 4.0 K/uL   Monocytes Relative 6 %   Monocytes Absolute 0.6 0.1 -  1.0 K/uL   Eosinophils Relative 0 %   Eosinophils Absolute 0.0 0.0 - 0.5 K/uL   Basophils Relative 0 %   Basophils Absolute 0.0 0.0 - 0.1 K/uL   WBC Morphology Abnormal lymphocytes present    RBC Morphology MIXED RBC POPULATION.    Smear Review Normal platelet morphology     Comment: PLATELETS APPEAR DECREASED   Immature Granulocytes 1 %   Abs Immature Granulocytes 0.10 (H) 0.00 - 0.07 K/uL    Comment: Performed at [facility], [address] [place], [place] [address]  Comprehensive metabolic panel     Status: Abnormal   Collection Time: 11/23/20  5:56 AM  Result Value Ref Range   Sodium 136 135 - 145 mmol/L   Potassium 4.4 3.5 - 5.1 mmol/L   Chloride 101 98 - 111 mmol/L   CO2 29 22 - 32 mmol/L   Glucose, Bld 206 (H) 70 - 99 mg/dL    Comment: Glucose reference range applies only to samples taken after fasting for at least 8 hours.   BUN 9 6 - 20 mg/dL   Creatinine, Ser 0.51 0.44 - 1.00 mg/dL   Calcium 8.1 (L) 8.9 - 10.3 mg/dL   Total Protein 6.3 (L) 6.5 - 8.1 g/dL   Albumin 2.5 (L) 3.5 - 5.0 g/dL   AST 28 15 - 41 U/L   ALT 5 0 - 44 U/L   Alkaline Phosphatase 58 38 - 126 U/L   Total Bilirubin 0.9 0.3 - 1.2 mg/dL   GFR, Estimated >60 >60 mL/min    Comment: (NOTE) Calculated using the CKD-EPI Creatinine Equation (2021)    Anion gap 6 5 - 15    Comment: Performed at [facility], [address], [place], [place] [address]  Lipase,  blood     Status: Abnormal   Collection Time: 11/23/20  5:56 AM  Result Value Ref Range   Lipase 541 (H) 11 - 51 U/L    Comment: RESULT CONFIRMED BY MANUAL DILUTION. QSD Performed at [facility], [address] [place], [place] [address]   Glucose, capillary     Status: Abnormal   Collection Time: 11/23/20  7:29 AM  Result Value Ref Range   Glucose-Capillary 192 (H) 70 - 99 mg/dL    Comment: Glucose reference range applies only to samples taken after fasting for at least 8 hours.  Glucose, capillary     Status:  Abnormal   Collection Time: 11/23/20 11:23 AM  Result Value Ref Range   Glucose-Capillary 201 (H) 70 - 99 mg/dL    Comment: Glucose reference range applies only to samples taken after fasting for at least 8 hours.  Glucose, capillary     Status: Abnormal   Collection Time: 11/23/20  3:51 PM  Result Value Ref Range   Glucose-Capillary 212 (H) 70 - 99 mg/dL    Comment: Glucose reference range applies only to samples taken after fasting for at least 8 hours.  Glucose, capillary     Status: Abnormal   Collection Time: 11/23/20  7:42 PM  Result Value Ref Range   Glucose-Capillary 175 (H) 70 - 99 mg/dL    Comment: Glucose reference range applies only to samples taken after fasting for at least 8 hours.  Glucose, capillary     Status: Abnormal   Collection Time: 11/24/20 12:10 AM  Result Value Ref Range   Glucose-Capillary 144 (H) 70 - 99 mg/dL    Comment: Glucose reference range applies only to samples taken after fasting for at least 8 hours.  Glucose, capillary     Status: None   Collection Time: 11/24/20  4:38 AM  Result Value Ref Range   Glucose-Capillary 98 70 - 99 mg/dL    Comment: Glucose reference range applies only to samples taken after fasting for at least 8 hours.  CBC with Differential/Platelet     Status: Abnormal   Collection Time: 11/24/20  4:40 AM  Result Value Ref Range   WBC 10.0 4.0 - 10.5 K/uL   RBC 3.74 (L) 3.87 - 5.11 MIL/uL   Hemoglobin 9.7 (L) 12.0 - 15.0 g/dL   HCT 30.4 (L) 36.0 - 46.0 %   MCV 81.3 80.0 - 100.0 fL   MCH 25.9 (L) 26.0 - 34.0 pg   MCHC 31.9 30.0 - 36.0 g/dL   RDW 16.8 (H) 11.5 - 15.5 %   Platelets 59 (L) 150 - 400 K/uL    Comment: Immature Platelet Fraction may be clinically indicated, consider ordering this additional test [id number] CRITICAL VALUE NOTED.  VALUE IS CONSISTENT WITH PREVIOUSLY REPORTED AND CALLED VALUE.    nRBC 0.0 0.0 - 0.2 %   Neutrophils Relative % 79 %   Neutro Abs 7.9 (H) 1.7 - 7.7 K/uL   Lymphocytes Relative 16 %    Lymphs Abs 1.6 0.7 - 4.0 K/uL   Monocytes Relative 4 %   Monocytes Absolute 0.4 0.1 - 1.0 K/uL   Eosinophils Relative 0 %   Eosinophils Absolute 0.0 0.0 - 0.5 K/uL   Basophils Relative 0 %   Basophils Absolute 0.0 0.0 - 0.1 K/uL   Immature Granulocytes 1 %   Abs Immature Granulocytes 0.08 (H) 0.00 - 0.07 K/uL    Comment: Performed at [facility], [address] [place], [place]  [address]  Comprehensive metabolic panel     Status: Abnormal   Collection Time: 11/24/20  4:40 AM  Result Value Ref Range   Sodium 135 135 - 145 mmol/L   Potassium 4.7 3.5 - 5.1 mmol/L   Chloride 106 98 - 111 mmol/L   CO2 26 22 - 32 mmol/L   Glucose, Bld 91 70 - 99 mg/dL    Comment: Glucose reference range applies only to samples taken after fasting for at least 8 hours.   BUN 8 6 - 20 mg/dL   Creatinine, Ser 0.56 0.44 - 1.00 mg/dL   Calcium 7.9 (L) 8.9 - 10.3 mg/dL   Total Protein 6.1 (L) 6.5 - 8.1 g/dL   Albumin 2.5 (L) 3.5 - 5.0 g/dL   AST 23 15 - 41 U/L   ALT <5 0 - 44 U/L   Alkaline Phosphatase 58 38 - 126 U/L   Total Bilirubin 0.7 0.3 - 1.2 mg/dL   GFR, Estimated >60 >60 mL/min    Comment: (NOTE) Calculated using the CKD-EPI Creatinine Equation (2021)    Anion gap 3 (L) 5 - 15    Comment: Performed at [facility], [address] [place], [place] [address]  Lipase, blood     Status: Abnormal   Collection Time: 11/24/20  4:40 AM  Result Value Ref Range   Lipase 305 (H) 11 - 51 U/L    Comment: Performed at [facility], [address] [place], [place] [address]  Glucose, capillary     Status: Abnormal   Collection Time: 11/24/20  7:31 AM  Result Value Ref Range   Glucose-Capillary 126 (H) 70 - 99 mg/dL    Comment: Glucose reference range applies only to samples taken after fasting for at least 8 hours.  Glucose, capillary     Status: Abnormal   Collection Time: 11/24/20 12:23 PM  Result Value Ref Range   Glucose-Capillary 115 (H) 70 - 99 mg/dL    Comment:  Glucose reference range applies only to samples taken after fasting for at least 8 hours.  Glucose, capillary     Status: Abnormal   Collection Time: 11/24/20  4:16 PM  Result Value Ref Range   Glucose-Capillary 112 (H) 70 - 99 mg/dL    Comment: Glucose reference range applies only to samples taken after fasting for at least 8 hours.    Current Facility-Administered Medications  Medication Dose Route Frequency Provider Last Rate Last Admin   acetaminophen (TYLENOL) tablet 650 mg  650 mg Oral Q6H PRN Cox, Amy N, DO   650 mg at 11/22/20 3818   Or   acetaminophen (TYLENOL) suppository 650 mg  650 mg Rectal Q6H PRN Cox, Amy N, DO       acetaminophen (TYLENOL) tablet 1,000 mg  1,000 mg Oral Once Cox, Amy N, DO       albuterol (PROVENTIL) (2.5 MG/3ML) 0.083% nebulizer solution 3 mL  3 mL Inhalation Q6H PRN Cox, Amy N, DO       chlorhexidine (PERIDEX) 0.12 % solution 15 mL  15 mL Mouth Rinse BID Priscella Mann, Sudheer B, MD   15 mL at 11/24/20 0901   diltiazem (CARDIZEM CD) 24 hr capsule 120 mg  120 mg Oral Daily Cox, Amy N, DO   120 mg at 11/24/20 0900   [START ON 11/25/2020] fluconazole (DIFLUCAN) tablet 100 mg  100 mg Oral Daily Sreenath, Sudheer B, MD       hydrALAZINE (APRESOLINE) tablet 50 mg  50 mg Oral  Q8H Priscella Mann, Sudheer B, MD   50 mg at 11/24/20 1413   insulin aspart (novoLOG) injection 0-15 Units  0-15 Units Subcutaneous Q4H Ralene Muskrat B, MD   2 Units at 11/24/20 6045   insulin glargine-yfgn (SEMGLEE) injection 35 Units  35 Units Subcutaneous Daily Ralene Muskrat B, MD   35 Units at 11/24/20 0900   ketorolac (TORADOL) 30 MG/ML injection 15 mg  15 mg Intravenous Q6H PRN Ralene Muskrat B, MD   15 mg at 11/23/20 2048   lactated ringers infusion   Intravenous Continuous Ralene Muskrat B, MD 100 mL/hr at 11/24/20 1307 New Bag at 11/24/20 1307   levothyroxine (SYNTHROID) tablet 100 mcg  100 mcg Oral QAC breakfast Ralene Muskrat B, MD   100 mcg at 11/24/20 0500   magic mouthwash  w/lidocaine  15 mL Oral TID Ralene Muskrat B, MD   15 mL at 11/24/20 0901   MEDLINE mouth rinse  15 mL Mouth Rinse q12n4p Ralene Muskrat B, MD   15 mL at 11/24/20 1226   mirtazapine (REMERON) tablet 15 mg  15 mg Oral QHS Sreenath, Sudheer B, MD       nystatin (MYCOSTATIN) 100000 UNIT/ML suspension 500,000 Units  5 mL Oral QID Ralene Muskrat B, MD   500,000 Units at 11/24/20 1412   ondansetron (ZOFRAN) tablet 4 mg  4 mg Oral Q6H PRN Cox, Amy N, DO       Or   ondansetron (ZOFRAN) injection 4 mg  4 mg Intravenous Q6H PRN Cox, Amy N, DO   4 mg at 11/23/20 0751   pantoprazole (PROTONIX) EC tablet 40 mg  40 mg Oral Daily Cox, Amy N, DO   40 mg at 11/24/20 0900   rosuvastatin (CRESTOR) tablet 10 mg  10 mg Oral QHS Cox, Amy N, DO   10 mg at 11/23/20 2022    Musculoskeletal: Strength & Muscle Tone: within normal limits Gait & Station:  Not tested Patient leans: N/A            Psychiatric Specialty Exam:  Presentation  General Appearance:  No data recorded Eye Contact: No data recorded Speech: No data recorded Speech Volume: No data recorded Handedness: No data recorded  Mood and Affect  Mood: No data recorded Affect: No data recorded  Thought Process  Thought Processes: No data recorded Descriptions of Associations:No data recorded Orientation:No data recorded Thought Content:No data recorded History of Schizophrenia/Schizoaffective disorder:No data recorded Duration of Psychotic Symptoms:No data recorded Hallucinations:No data recorded Ideas of Reference:No data recorded Suicidal Thoughts:No data recorded Homicidal Thoughts:No data recorded  Sensorium  Memory: No data recorded Judgment: No data recorded Insight: No data recorded  Executive Functions  Concentration: No data recorded Attention Span: No data recorded Recall: No data recorded Fund of Knowledge: No data recorded Language: No data recorded  Psychomotor Activity  Psychomotor  Activity: No data recorded  Assets  Assets: No data recorded  Sleep  Sleep: No data recorded  Physical Exam: Physical Exam Vitals and nursing note reviewed.  Constitutional:      Appearance: Normal appearance.  HENT:     Head: Normocephalic and atraumatic.     Mouth/Throat:     Pharynx: Oropharynx is clear.  Eyes:     Pupils: Pupils are equal, round, and reactive to light.  Cardiovascular:     Rate and Rhythm: Normal rate and regular rhythm.  Pulmonary:     Effort: Pulmonary effort is normal.     Breath sounds: Normal breath sounds.  Abdominal:  General: Abdomen is flat.     Palpations: Abdomen is soft.  Musculoskeletal:        General: Normal range of motion.  Skin:    General: Skin is warm and dry.  Neurological:     General: No focal deficit present.     Mental Status: She is alert. Mental status is at baseline.  Psychiatric:        Attention and Perception: She is inattentive.        Mood and Affect: Affect is blunt.        Speech: She is noncommunicative. Speech is tangential.        Behavior: Behavior is slowed.        Cognition and Memory: Cognition is impaired. Memory is impaired.   Review of Systems  Constitutional:  Positive for malaise/fatigue.  HENT: Negative.    Eyes: Negative.   Respiratory: Negative.    Cardiovascular: Negative.   Gastrointestinal:  Positive for abdominal pain and nausea.  Musculoskeletal: Negative.   Skin: Negative.   Neurological: Negative.   Psychiatric/Behavioral: Negative.    Blood pressure 129/79, pulse 91, temperature 98.8 F (37.1 C), resp. rate 18, height 6' (1.829 m), weight 83 kg, SpO2 97 %. Body mass index is 24.82 kg/m.  Treatment Plan Summary: Plan patient who is currently presenting with a kind of delirium.  Clearly having significant cognitive problems.  Wide differential diagnosis.  If needed at some point formal neurocognitive testing could provide a more detailed picture of her deficits which sometimes  can be helpful in diagnosis however we do not have this service available in the [facility] system and it is not something I can perform.  As far as I can tell she does not appear to be having a primary mood disorder or psychotic disorder.  It is not impossible that her symptoms could represent some form of reaction to anxiety but this would be a last ditch assumption.  For now appears probably to have an unknown medical cause of delirium.  I am not adding any medication.  Suggest further continued work-up including MRI and requesting neurology consult.  Disposition: No evidence of imminent risk to self or others at present.   Patient does not meet criteria for psychiatric inpatient admission. Supportive therapy provided about ongoing stressors. Discussed crisis plan, support from social network, calling 911, coming to the Emergency Department, and calling Suicide Hotline.  Alethia Berthold, MD 11/24/2020 5:04 PM

## 2020-11-24 NOTE — TOC Initial Note (Signed)
Transition of Care [facility]) - Initial/Assessment Note    Patient Details  Name: Tonya Myers MRN: [id number] Date of Birth: March 03, 1966  Transition of Care [facility]) [name] Contact:    Shelbie Hutching, RN Phone Number: 11/24/2020, 2:21 PM  Clinical Narrative:                 Patient admitted to the hospital with altered mental status.  Patient was in the hospital in March with Meningitis and discharged to [name].  Patient is currently coming in from home where she lives with her husband and is open with [facility] for PT and OT.  Patient has a wheelchair and walker at home.  Husband provides her transportation.  Patient is current with Dr. Kary Kos for PCP services. Dr. Melrose Nakayama with neurology and Dr. Lacinda Axon with neurosurgery.    Husband is at the bedside, patient voices that she wants to be able to eat, her lunch has only 2 bites eaten off the plate.  Continued medical workup, not stable for discharge at this time.  Unclear etiology for symptoms.  Expected Discharge Plan: [place] Barriers to Discharge: Continued Medical Work up   Patient Goals and CMS Choice Patient states their goals for this hospitalization and ongoing recovery are:: Patient wants to be able to eat CMS Medicare.gov Compare Post Acute Care list provided to:: Patient Choice offered to / list presented to : Patient, Spouse  Expected Discharge Plan and Services Expected Discharge Plan: [place]   Discharge Planning Services: CM Consult Post Acute Care Choice: Home Health, [name] Living arrangements for the past 2 months: Single Family Home                 DME Arranged: N/A DME Agency: NA       HH Arranged: PT, OT [place] Agency: [facility] Date HH Agency Contacted: 11/24/20 Time [place]: 1419 Representative spoke with at [place]: [name]  Prior Living Arrangements/Services Living arrangements for the past 2 months:  [place] with:: Spouse, Parents Patient language and need for interpreter reviewed:: Yes Do you feel safe going back to the place where you live?: Yes      Need for Family Participation in Patient Care: Yes (Comment) (encephalopathy) Care giver support system in place?: Yes (comment) (husband and daughter) Current home services: DME, Home PT, Home OT (Walker, wheelchair) Criminal Activity/Legal Involvement Pertinent to Current Situation/Hospitalization: No - Comment as needed  Activities of Daily Living Home Assistive Devices/Equipment: Environmental consultant (specify type) ADL Screening (condition at time of admission) Patient's cognitive ability adequate to safely complete daily activities?: No Is the patient deaf or have difficulty hearing?: No Does the patient have difficulty seeing, even when wearing glasses/contacts?: No Does the patient have difficulty concentrating, remembering, or making decisions?: Yes Patient able to express need for assistance with ADLs?: No Does the patient have difficulty dressing or bathing?: Yes Independently performs ADLs?: No Communication: Independent Dressing (OT): Needs assistance Is this a change from baseline?: Change from baseline, expected to last >3 days Grooming: Needs assistance Is this a change from baseline?: Change from baseline, expected to last >3 days Feeding: Independent Bathing: Needs assistance Is this a change from baseline?: Change from baseline, expected to last >3 days Toileting: Needs assistance Is this a change from baseline?: Change from baseline, expected to last >3days In/Out Bed: Needs assistance Is this a change from baseline?: Change from baseline, expected to last >  3 days Walks in Home: Needs assistance Is this a change from baseline?: Change from baseline, expected to last >3 days Does the patient have difficulty walking or climbing stairs?: Yes Weakness of Legs: Both Weakness of Arms/Hands: Right  Permission  Sought/Granted Permission sought to share information with : Case Manager, Family Supports, Other (comment) Permission granted to share information with : Yes, Verbal Permission Granted  Share Information with NAME: [name]  Permission granted to share info w AGENCY: [facility]  Permission granted to share info w Relationship: husband     Emotional Assessment Appearance:: Appears stated age Attitude/Demeanor/Rapport: Engaged Affect (typically observed): Flat Orientation: : Oriented to Self, Oriented to Place, Oriented to Situation Alcohol / Substance Use: Not Applicable Psych Involvement: Yes (comment)  Admission diagnosis:  Altered mental status [R41.82] Encephalopathy acute [G93.40] Altered mental status, unspecified altered mental status type [R41.82] Sepsis with encephalopathy without septic shock, due to unspecified organism ([place]) [A41.9, R65.20, G93.40] Patient Active Problem List   Diagnosis Date Noted   Encephalopathy acute 11/20/2020   Altered mental status 11/19/2020   Right arm weakness 10/21/2020   Quadriplegia ([place]) 10/10/2020   Encephalitis 10/10/2020   Wheelchair dependence 10/10/2020   Acute focal neurological deficit 10/02/2020   Functional paraparesis 10/02/2020   Left upper quadrant abdominal pain    Pain    Chronic pain of both knees    Hypokalemia    Urinary retention    Slow transit constipation    Meningitis    Labile blood glucose    Uncontrolled type 2 diabetes mellitus with hyperosmolar nonketotic hyperglycemia (HCC)    Transaminitis    Sleep disturbance    Anemia of chronic disease    Bacterial spinal epidural abscess 06/27/2020   Obesity (BMI 30.0-34.9)    Hypomagnesemia    Hypernatremia    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HCC)    Epidural abscess    Pneumococcal meningitis    Aphasia 06/10/2020   Hyperglycemic crisis in diabetes mellitus ([place]) 33/29/5188   Acute metabolic encephalopathy 41/66/0630   Severe sepsis (HCC)    Diabetic ketoacidosis  without coma associated with type 2 diabetes mellitus ([place])    AKI (acute kidney injury) ([place])    Elevated LFTs    Acute encephalopathy 06/09/2020   Bacterial vaginosis 11/01/2016   Chronic right shoulder pain 03/23/2016   Numbness and tingling 03/23/2016   Uncontrolled type 2 diabetes mellitus with hyperglycemia, without long-term current use of insulin ([place]) 10/27/2015   DDD (degenerative disc disease), cervical 07/29/2015   Cervical disc disorder with radiculopathy of cervical region 07/29/2015   Cervical facet syndrome 07/29/2015   DDD (degenerative disc disease), lumbar 07/29/2015   Facet syndrome, lumbar 07/29/2015   Bilateral occipital neuralgia 07/29/2015   Sacroiliac joint dysfunction 07/29/2015   DJD of shoulder 07/29/2015   Herpes simplex vulvovaginitis 05/29/2015   Acute hemorrhoid 03/11/2015   CLL (chronic lymphocytic leukemia) ([place]) 01/15/2015   Hypothyroidism 12/26/2014   Arthritis 12/26/2014   Carpal tunnel syndrome, bilateral 12/26/2014   Controlled type 2 diabetes mellitus without complication, without long-term current use of insulin ([place]) 12/26/2014   Essential hypertension 12/26/2014   Hyperlipidemia, mixed 12/26/2014   PCP:  [name], MD Pharmacy:   [id number] - [place], [place] - [address] [place] [address] Phone: [phone] Fax: [phone]     Social Determinants of Health (SDOH) Interventions    Readmission Risk Interventions Readmission Risk Prevention Plan 11/24/2020  Transportation Screening Complete  PCP or Specialist Appt within 3-5 Days  Complete  HRI or Home Care Consult Complete  Social Work Consult for [place] Planning/Counseling Complete  Palliative Care Screening Not Applicable  Medication Review Press photographer) Complete  Some recent data might be hidden

## 2020-11-24 NOTE — Consult Note (Addendum)
Pharmacy Antibiotic Note  Tonya Myers is a 55 y.o. 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 Pt has a recent history for pneumococcal meningitis and bacteremia (05/2020). Pharmacy has been consulted for Acyclovir dosing.  Pt was taken off acyclovir 7/30 due to HSV PCR results being negative on 7/28, but was restarted on 7/31 in response to worsening clinical condition. Repeat HSV PCR was ordered on 7/31, but has not been collected. Since restarting, the patient has been afebrile for the past 24 hours and WBC down to 10. Pt was d/c'd from ceftriaxone on 7/31 by Dr. Priscella Mann for low suspicion of bacterial infection. ID is following.    Plan: Acyclovir: Continue Acyclovir 830 mg q8h (day 5 of antiviral) (used actual body weight since BMI not >30) and continue LR @100ml /hr to help prevent renal toxicity.  Fluconazole  Adjusted from 150 to 100mg  by Dr. Priscella Mann for oral thrush  Continue fluconazole 100mg  daily (day 4 of antifungal) Monitor renal function and adjust doses as clinically indicated  Height: 6' (182.9 cm) Weight: 83 kg (182 lb 15.7 oz) IBW/kg (Calculated) : 73.1  Temp (24hrs), Avg:98.7 F (37.1 C), Min:98.2 F (36.8 C), Max:99.5 F (37.5 C)  Recent Labs  Lab 11/19/20 1648 11/19/20 1846 11/20/20 0737 11/20/20 0955 11/21/20 0525 11/22/20 0650 11/23/20 0556 11/24/20 0440  WBC 2.4*  --   --  2.3* 3.6* 8.0 10.9* 10.0  CREATININE 0.75  --  0.59  --  0.50 0.49 0.51 0.56  LATICACIDVEN 2.0* 1.9  --   --   --   --   --   --      Estimated Creatinine Clearance: 90.6 mL/min (by C-G formula based on SCr of 0.56 mg/dL).    Allergies  Allergen Reactions   Amoxapine    Penicillins     Antimicrobials this admission: Cefepime x 1 7/27 Ceftriaxone 7/28-30 Vancomycin 7/28-30 Acyclovir (7/28-30); (7/31>> Fluconazole 7/29>>  Dose adjustments this admission: 7/28 acyclovir 8/1 fluconazole  Microbiology results: 7/27 BCx: NEG 7/27 UCx:  <10k CFUs insignificant growth 7/28 HSV 1/2 CSF - negative/negative 7/28 CSF fungal smear / gram stain - NEG 7/28 COVID/FLU NEG 7/31 HSV 1/2 CSF - repeat ordered.  Thank you for allowing pharmacy to be a part of this patient's care.  Narda Rutherford, PharmD Pharmacy Resident  11/24/2020 2:25 PM

## 2020-11-25 ENCOUNTER — Telehealth: Payer: Self-pay | Admitting: *Deleted

## 2020-11-25 DIAGNOSIS — R4182 Altered mental status, unspecified: Secondary | ICD-10-CM | POA: Diagnosis not present

## 2020-11-25 DIAGNOSIS — G9340 Encephalopathy, unspecified: Secondary | ICD-10-CM | POA: Diagnosis not present

## 2020-11-25 DIAGNOSIS — R410 Disorientation, unspecified: Secondary | ICD-10-CM

## 2020-11-25 DIAGNOSIS — R4701 Aphasia: Secondary | ICD-10-CM | POA: Diagnosis not present

## 2020-11-25 LAB — GLUCOSE, CAPILLARY
Glucose-Capillary: 57 mg/dL — ABNORMAL LOW (ref 70–99)
Glucose-Capillary: 88 mg/dL (ref 70–99)
Glucose-Capillary: 96 mg/dL (ref 70–99)
Glucose-Capillary: 113 mg/dL — ABNORMAL HIGH (ref 70–99)
Glucose-Capillary: 124 mg/dL — ABNORMAL HIGH (ref 70–99)
Glucose-Capillary: 109 mg/dL — ABNORMAL HIGH (ref 70–99)
Glucose-Capillary: 59 mg/dL — ABNORMAL LOW (ref 70–99)
Glucose-Capillary: 65 mg/dL — ABNORMAL LOW (ref 70–99)
Glucose-Capillary: 88 mg/dL (ref 70–99)

## 2020-11-25 LAB — CBC WITH DIFFERENTIAL/PLATELET
Abs Immature Granulocytes: 0.07 10*3/uL (ref 0.00–0.07)
Basophils Absolute: 0 10*3/uL (ref 0.0–0.1)
Basophils Relative: 0 %
Eosinophils Absolute: 0 10*3/uL (ref 0.0–0.5)
Eosinophils Relative: 0 %
HCT: 28.4 % — ABNORMAL LOW (ref 36.0–46.0)
Hemoglobin: 9.3 g/dL — ABNORMAL LOW (ref 12.0–15.0)
Immature Granulocytes: 1 %
Lymphocytes Relative: 11 %
Lymphs Abs: 1 10*3/uL (ref 0.7–4.0)
MCH: 27.2 pg (ref 26.0–34.0)
MCHC: 32.7 g/dL (ref 30.0–36.0)
MCV: 83 fL (ref 80.0–100.0)
Monocytes Absolute: 0.3 10*3/uL (ref 0.1–1.0)
Monocytes Relative: 4 %
Neutro Abs: 7.3 10*3/uL (ref 1.7–7.7)
Neutrophils Relative %: 84 %
Platelets: 65 10*3/uL — ABNORMAL LOW (ref 150–400)
RBC: 3.42 MIL/uL — ABNORMAL LOW (ref 3.87–5.11)
RDW: 17.5 % — ABNORMAL HIGH (ref 11.5–15.5)
WBC: 8.7 10*3/uL (ref 4.0–10.5)
nRBC: 0 % (ref 0.0–0.2)

## 2020-11-25 LAB — LACTATE DEHYDROGENASE: LDH: 240 U/L — ABNORMAL HIGH (ref 98–192)

## 2020-11-25 LAB — SEDIMENTATION RATE: Sed Rate: 104 mm/hr — ABNORMAL HIGH (ref 0–30)

## 2020-11-25 LAB — LIPASE, BLOOD: Lipase: 325 U/L — ABNORMAL HIGH (ref 11–51)

## 2020-11-25 LAB — PATHOLOGIST SMEAR REVIEW

## 2020-11-25 LAB — OLIGOCLONAL BANDS, CSF + SERM

## 2020-11-25 MED ORDER — ADULT MULTIVITAMIN W/MINERALS CH
1.0000 | ORAL_TABLET | Freq: Every day | ORAL | Status: DC
  Administered 2020-11-25 – 2020-12-22 (×28): 1 via ORAL
  Filled 2020-11-25 (×28): qty 1

## 2020-11-25 MED ORDER — BOOST / RESOURCE BREEZE PO LIQD CUSTOM
1.0000 | Freq: Three times a day (TID) | ORAL | Status: DC
  Administered 2020-11-25 – 2020-12-22 (×65): 1 via ORAL

## 2020-11-25 MED ORDER — THIAMINE HCL 100 MG/ML IJ SOLN
500.0000 mg | Freq: Three times a day (TID) | INTRAVENOUS | Status: DC
  Administered 2020-11-25 (×3): 500 mg via INTRAVENOUS
  Filled 2020-11-25 (×7): qty 5

## 2020-11-25 MED ORDER — INSULIN GLARGINE-YFGN 100 UNIT/ML ~~LOC~~ SOLN
25.0000 [IU] | Freq: Every day | SUBCUTANEOUS | Status: DC
  Administered 2020-11-25: 25 [IU] via SUBCUTANEOUS
  Filled 2020-11-25 (×2): qty 0.25

## 2020-11-25 NOTE — Progress Notes (Signed)
PROGRESS NOTE    Tonya Myers  IEP:[id number] DOB: [birth year] DOA: 11/19/2020 PCP: [name], MD   Brief Narrative:  55 y.o. female with medical history significant for recent pneumococcal meningitis and bacteremia, depression, hypothyroid, IDDM2, presents to the ED for chief concern of altered mentation.   At bedside, she had expressive aphasia, right upper and lower extremity weakness. Confusion today per spouse. Can walk with a walker and assistance. Jumbled words. She has baseline weakness in the right arm compared to left arm.    Spouse reports that patient had poor PO intake for more than a week. Spouse endorses vomiting and dnies blood and black coffee ground substances.   Patient has been unable to tolerate the MRI due to severe claustrophobia.  Will attempt again with help of IV Ativan.  Lumbar puncture was performed, initial results not terribly impressive.  Not overtly concerning for meningitis.  Remains on broad-spectrum IV antibiotics and IV acyclovir.  Infectious disease consulted.  Concern for possible manifestation of underlying hematologic malignancy.  I have requested consultation from oncology.  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  She sees oncology only once yearly.  Patient was evaluated by oncology.  Very unlikely that current presentation is a manifestation of underlying CLL.  No clear infectious signs.  All cultures remain negative.  LP is inconsistent with meningitis.  CT head chest abdomen pelvis significant surprisingly for pancreatitis.  Lipase is elevated.  Patient has been responding to treatment for pancreatitis including bowel rest and IV fluids.  Oral thrush is responding to treatment.  Mental status remains poor.  Appetite remains poor.  When inquired about why she is not eating she denies throat or abdominal pain and states just poor appetite.  8/2: Patient remains altered and encephalopathic.  Etiology at this point has  been unclear.  I requested consultation from neurology.  Possible vitamin deficiency.  Recommending high-dose IV thiamine.     Assessment & Plan:   Principal Problem:   Subacute delirium Active Problems:   DDD (degenerative disc disease), cervical   Aphasia   Anemia of chronic disease   Acute focal neurological deficit   Encephalitis   Altered mental status   Encephalopathy acute  Acute pancreatitis Unclear etiology No reported history of alcohol intake, no radiographic evidence of gallstones Possible drug-induced however unclear exactly which drug could have driven this Initial abdominal examination negative for tenderness On interview patient does endorse left upper quadrant tenderness Lipase downtrending Triglycerides negative Plan: Continue with soft diet as needed.  Trend lipase.  Continue IV fluids for now.  As needed pain control, avoid narcotics.  Nightly Remeron for appetite stimulant effect.  Acute encephalopathy Unclear etiology Differentials include meningitis, viral versus bacterial versus aseptic Also CVA, hematologic malignancy, brain abscess Infectious disease on consult LP not indicative of bacterial meningitis Consideration for recurrence of underlying hematologic malignancy Unfortunately patient unable to tolerate MRI Oncology consultation appreciated, no clinical evidence of CLL recurrence 7/30: Low-grade temp 100.4 Plan: Notification of infection.  Antibiotics remain off Neurology consulted 8/2, recommendations appreciated May benefit from EEG Will order high-dose IV thiamine 500 mg IV x3 days Reassess mental status MRI may be indicated.  If so she will need anesthesia sedation.  If this is the care plan likely will image MRI brain and total spine. Await further neurology recommendations  Pancytopenia History of CLL All 3 cell lines acutely depressed Oncology consulted Patient's CLL has been in remission since 2016 Recurrence of hematologic  note malignancy felt unlikely Plan: Daily CBC with differential Appreciate oncology evaluation  Marked hypokalemia Suspect secondary to vomiting and poor p.o. intake Improved after replacement Check daily labs  Oral thrush Patient with clinical indications of thrush Associated with difficulty swallowing Seems to be improving Patient states poor p.o. intake is driven by appetite rather than pain Plan: Continue nystatin swish and swallow, Decrease dose of Diflucan 100 daily  Insulin-dependent diabetes mellitus Patient is on long-acting formulation 50 units daily at home in addition to metformin This is on hold Plan: Glargine 25 units daily Sliding scale coverage Carb modified diet Diabetes coordinator consult  Hyperlipidemia PTA statin  Hypothyroid PTA Synthroid   DVT prophylaxis: TED hose Code Status: Full code Family Communication: [name] [phone] on 7/29, 7/30, 8/1 Disposition Plan: Status is: Inpatient  Remains inpatient appropriate because:Inpatient level of care appropriate due to severity of illness  Dispo: The patient is from: Home              Anticipated d/c is to: Home              Patient currently is not medically stable to d/c.   Difficult to place patient No  Acute encephalopathy.  Etiology remains unclear.  Disposition plan unclear.  Possible vitamin deficiency.  Attempting high-dose IV thiamine.     Level of care: Med-Surg  Consultants:  ID Oncology Neurology  Procedures:  Lumbar puncture 7/28  Antimicrobials:    Subjective: Patient seen and examined.  More awake.  Remains confused.  Answers questions appropriately.  Unable to provide substantial history.  Objective: Vitals:   11/25/20 0451 11/25/20 0552 11/25/20 0555 11/25/20 0733  BP: (!) 142/79 124/76  134/87  Pulse: (!) 115 (!) 114 (!) 107 (!) 102  Resp: 18 18  16   Temp: 98.9 F (37.2 C) 99.8 F (37.7 C)  98.7 F (37.1 C)  TempSrc: Oral Oral  Oral  SpO2: 97%  97% 96% 99%  Weight:      Height:        Intake/Output Summary (Last 24 hours) at 11/25/2020 1027 Last data filed at 11/25/2020 0959 Gross per 24 hour  Intake 866.71 ml  Output 1950 ml  Net -1083.29 ml   Filed Weights   11/20/20 0507  Weight: 83 kg    Examination:  General exam: Sitting in bed.  Confused.  Lethargic Respiratory system: Lungs clear.  Normal work of breathing.  Room air Cardiovascular system: S1-S2, regular rate and rhythm, no murmurs, no pedal edema  gastrointestinal system: Soft, nondistended, mild TTP LUQ, normal bowel sounds Central nervous system: Awake, confused, oriented x2, no focal deficits Extremities: Decreased power symmetrically Skin: No obvious rashes or lesions Psychiatry: Judgement and insight appear impaired. Mood & affect flattened.     Data Reviewed: I have personally reviewed following labs and imaging studies  CBC: Recent Labs  Lab 11/21/20 0525 11/22/20 0650 11/23/20 0556 11/24/20 0440 11/25/20 0611  WBC 3.6* 8.0 10.9* 10.0 8.7  NEUTROABS 2.5 6.2 8.6* 7.9* 7.3  HGB 8.1* 8.7* 9.4* 9.7* 9.3*  HCT 25.6* 26.8* 29.4* 30.4* 28.4*  MCV 83.1 82.2 82.4 81.3 83.0  PLT 72* 75* 67* 59* 65*   Basic Metabolic Panel: Recent Labs  Lab 11/20/20 0602 11/20/20 0737 11/21/20 0525 11/22/20 0650 11/23/20 0556 11/24/20 0440  NA  --  139 140 136 136 135  K  --  3.4* 2.5* 2.9* 4.4 4.7  CL  --  104 107 103 101 106  CO2  --  23 28 28 29 26   GLUCOSE  --  266* 229* 252* 206* 91  BUN  --  11 8 8 9 8   CREATININE  --  0.59 0.50 0.49 0.51 0.56  CALCIUM  --  7.6* 8.1* 7.9* 8.1* 7.9*  MG 1.6*  --   --   --   --   --    GFR: Estimated Creatinine Clearance: 90.6 mL/min (by C-G formula based on SCr of 0.56 mg/dL). Liver Function Tests: Recent Labs  Lab 11/20/20 0737 11/21/20 0525 11/22/20 0650 11/23/20 0556 11/24/20 0440  AST 42* 31 33 28 23  ALT <5 <5 <5 5 <5  ALKPHOS 59 56 52 58 58  BILITOT 0.9 0.5 0.7 0.9 0.7  PROT 6.4* 6.8 6.3* 6.3* 6.1*   ALBUMIN 2.3* 2.4* 2.5* 2.5* 2.5*   Recent Labs  Lab 11/21/20 1800 11/23/20 0556 11/24/20 0440 11/25/20 0611  LIPASE 633* 541* 305* 325*   Recent Labs  Lab 11/20/20 0545  AMMONIA 17   Coagulation Profile: Recent Labs  Lab 11/20/20 0602  INR 1.0   Cardiac Enzymes: No results for input(s): CKTOTAL, CKMB, CKMBINDEX, TROPONINI in the last 168 hours. BNP (last 3 results) No results for input(s): PROBNP in the last 8760 hours. HbA1C: No results for input(s): HGBA1C in the last 72 hours. CBG: Recent Labs  Lab 11/24/20 1957 11/24/20 2344 11/25/20 0404 11/25/20 0439 11/25/20 0735  GLUCAP 86 87 57* 88 96   Lipid Profile: No results for input(s): CHOL, HDL, LDLCALC, TRIG, CHOLHDL, LDLDIRECT in the last 72 hours.  Thyroid Function Tests: No results for input(s): TSH, T4TOTAL, FREET4, T3FREE, THYROIDAB in the last 72 hours.  Anemia Panel: No results for input(s): VITAMINB12, FOLATE, FERRITIN, TIBC, IRON, RETICCTPCT in the last 72 hours.  Sepsis Labs: Recent Labs  Lab 11/19/20 1648 11/19/20 1846 11/20/20 0737  PROCALCITON  --   --  0.41  LATICACIDVEN 2.0* 1.9  --     Recent Results (from the past 240 hour(s))  Blood culture (routine x 2)     Status: None   Collection Time: 11/19/20  4:47 PM   Specimen: BLOOD  Result Value Ref Range Status   Specimen Description BLOOD RIGHT ANTECUBITAL  Final   Special Requests   Final    BOTTLES DRAWN AEROBIC AND ANAEROBIC Blood Culture adequate volume   Culture   Final    NO GROWTH 5 DAYS Performed at [facility], [address] [place], [place] [address]    Report Status 11/24/2020 FINAL  Final  Resp Panel by RT-PCR (Flu A&B, Covid) Nasopharyngeal Swab     Status: None   Collection Time: 11/19/20  4:48 PM   Specimen: Nasopharyngeal Swab; Nasopharyngeal(NP) swabs in vial transport medium  Result Value Ref Range Status   SARS Coronavirus 2 by RT PCR NEGATIVE NEGATIVE Final    Comment: (NOTE) SARS-CoV-2 target  nucleic acids are NOT DETECTED.  The SARS-CoV-2 RNA is generally detectable in upper respiratory specimens during the acute phase of infection. The lowest concentration of SARS-CoV-2 viral copies this assay can detect is 138 copies/mL. A negative result does not preclude SARS-Cov-2 infection and should not be used as the sole basis for treatment or other patient management decisions. A negative result may occur with  improper specimen collection/handling, submission of specimen other than nasopharyngeal swab, presence of viral mutation(s) within the areas targeted by this assay, and inadequate number of viral copies(<138 copies/mL). A negative result must be combined with clinical observations, patient history,  and epidemiological information. The expected result is Negative.  Fact Sheet for Patients:  EntrepreneurPulse.com.au  Fact Sheet for Healthcare Providers:  IncredibleEmployment.be  This test is no t yet approved or cleared by the [place] FDA and  has been authorized for detection and/or diagnosis of SARS-CoV-2 by FDA under an Emergency Use Authorization (EUA). This EUA will remain  in effect (meaning this test can be used) for the duration of the COVID-19 declaration under Section 564(b)(1) of the Act, 21 U.S.C.section 360bbb-3(b)(1), unless the authorization is terminated  or revoked sooner.       Influenza A by PCR NEGATIVE NEGATIVE Final   Influenza B by PCR NEGATIVE NEGATIVE Final    Comment: (NOTE) The Xpert Xpress SARS-CoV-2/FLU/RSV plus assay is intended as an aid in the diagnosis of influenza from Nasopharyngeal swab specimens and should not be used as a sole basis for treatment. Nasal washings and aspirates are unacceptable for Xpert Xpress SARS-CoV-2/FLU/RSV testing.  Fact Sheet for Patients: EntrepreneurPulse.com.au  Fact Sheet for Healthcare  Providers: IncredibleEmployment.be  This test is not yet approved or cleared by the [place] FDA and has been authorized for detection and/or diagnosis of SARS-CoV-2 by FDA under an Emergency Use Authorization (EUA). This EUA will remain in effect (meaning this test can be used) for the duration of the COVID-19 declaration under Section 564(b)(1) of the Act, 21 U.S.C. section 360bbb-3(b)(1), unless the authorization is terminated or revoked.  Performed at [facility], [address] [place], [place] [address]   Blood culture (routine x 2)     Status: None   Collection Time: 11/19/20  6:46 PM   Specimen: BLOOD  Result Value Ref Range Status   Specimen Description BLOOD RIGHT ANTECUBITAL  Final   Special Requests   Final    BOTTLES DRAWN AEROBIC AND ANAEROBIC Blood Culture adequate volume   Culture   Final    NO GROWTH 5 DAYS Performed at [facility], [address], [place], [place] [address]    Report Status 11/24/2020 FINAL  Final  Urine Culture     Status: Abnormal   Collection Time: 11/19/20  9:33 PM   Specimen: Urine, Clean Catch  Result Value Ref Range Status   Specimen Description   Final    URINE, CLEAN CATCH Performed at [facility], [address], [place], [place] [address]    Special Requests   Final    NONE Performed at [facility], [address], [place], [place] [address]    Culture (A)  Final    <10,000 COLONIES/mL INSIGNIFICANT GROWTH Performed at [facility], [place] [address], [place], [place] [address]    Report Status 11/21/2020 FINAL  Final  Culture, fungus without smear     Status: None (Preliminary result)   Collection Time: 11/20/20 11:09 AM   Specimen: CSF; Cerebrospinal Fluid  Result Value Ref Range Status   Specimen Description   Final    CSF Performed at [facility], [address], [place], [place] [address]    Special Requests   Final    NONE Performed  at [facility], [address], [place], [place] [address]    Culture   Final    No Fungi Isolated in 4 Weeks Performed at [facility], [place] [address] [place], [place] [address]    Report Status PENDING  Incomplete  CSF culture w Gram Stain     Status: None   Collection Time: 11/20/20  11:09 AM   Specimen: PATH Cytology CSF; Cerebrospinal Fluid  Result Value Ref Range Status   Specimen Description   Final    CSF Performed at [facility], [address] [place], [place] [address]    Special Requests   Final    NONE Performed at [facility], [address] [place], [place] [address]    Gram Stain   Final    NO ORGANISMS SEEN WBC SEEN RED BLOOD CELLS PRESENT Performed at [facility], [address], [place], [place] [address]    Culture   Final    NO GROWTH 3 DAYS Performed at [facility], [place] [address] [place], [place] [address]    Report Status 11/23/2020 FINAL  Final         Radiology Studies: No results found.      Scheduled Meds:  acetaminophen  1,000 mg Oral Once   chlorhexidine  15 mL Mouth Rinse BID   diltiazem  120 mg Oral Daily   fluconazole  100 mg Oral Daily   hydrALAZINE  50 mg Oral Q8H   insulin aspart  0-15 Units Subcutaneous Q4H   insulin glargine-yfgn  25 Units Subcutaneous Daily   levothyroxine  100 mcg Oral QAC breakfast   magic mouthwash w/lidocaine  15 mL Oral TID   mouth rinse  15 mL Mouth Rinse q12n4p   mirtazapine  15 mg Oral QHS   nystatin  5 mL Oral QID   pantoprazole  40 mg Oral Daily   rosuvastatin  10 mg Oral QHS   Continuous Infusions:  lactated ringers 100 mL/hr at 11/25/20 0010   thiamine injection       LOS: 5 days    Time spent: 25 minutes    Sidney Ace, MD [facility] Pager [phone]  If 7PM-7AM, please contact night-coverage 11/25/2020, 10:27 AM

## 2020-11-25 NOTE — Procedures (Signed)
Patient Name: Tonya Myers  MRN: [id number]  Epilepsy Attending: Lora Havens  Referring Physician/Provider: Dr Rosalin Hawking Date: 11/25/2020 Duration: 31.44 mins  Patient history: 55yo F with ams. EEG to evaluate for seizure  Level of alertness: Awake  AEDs during EEG study: None  Technical aspects: This EEG study was done with scalp electrodes positioned according to the 10-20 International system of electrode placement. Electrical activity was acquired at a sampling rate of 500Hz  and reviewed with a high frequency filter of 70Hz  and a low frequency filter of 1Hz . EEG data were recorded continuously and digitally stored.   Description: No posterior dominant rhythm was seen. EEG showed continuous generalized 3 to 6 Hz theta-delta slowing. Physiologic photic driving was not seen during photic stimulation.  Hyperventilation was not performed.     ABNORMALITY - Continuous slow, generalized  IMPRESSION: This study is suggestive of moderate diffuse encephalopathy, nonspecific etiology. No seizures or epileptiform discharges were seen throughout the recording.  [name] [name]

## 2020-11-25 NOTE — Progress Notes (Signed)
   11/25/20 0544  Notify: Provider  Provider response Other (Comment) (give 10am dose of cardizem now)  Date of Provider Response 11/25/20  Time of Provider Response [phone]

## 2020-11-25 NOTE — Progress Notes (Signed)
eeG done

## 2020-11-25 NOTE — Progress Notes (Signed)
Date of Admission:  11/19/2020      ID: Tonya Myers is a 55 y.o. female with     Encephalopathy    Subjective: Pt is more alert than befotre , very slow response, says she was not eating well, poor appetite, weight loss and on questioning pain abdomen  Medications:   acetaminophen  1,000 mg Oral Once   chlorhexidine  15 mL Mouth Rinse BID   diltiazem  120 mg Oral Daily   fluconazole  100 mg Oral Daily   hydrALAZINE  50 mg Oral Q8H   insulin aspart  0-15 Units Subcutaneous Q4H   insulin glargine-yfgn  25 Units Subcutaneous Daily   levothyroxine  100 mcg Oral QAC breakfast   magic mouthwash w/lidocaine  15 mL Oral TID   mouth rinse  15 mL Mouth Rinse q12n4p   mirtazapine  15 mg Oral QHS   nystatin  5 mL Oral QID   pantoprazole  40 mg Oral Daily   rosuvastatin  10 mg Oral QHS    Objective: Vital signs in last 24 hours: Temp:  [98.3 F (36.8 C)-99.9 F (37.7 C)] 98.7 F (37.1 C) (08/02 0733) Pulse Rate:  [81-115] 102 (08/02 0733) Resp:  [16-18] 16 (08/02 0733) BP: (124-144)/(76-91) 134/87 (08/02 0733) SpO2:  [96 %-99 %] 99 % (08/02 0733)  PHYSICAL EXAM:  General: awake, more alert, cooperative, no distress, confused, oriented in self and place but not in time Has some expressive dysphasia Head: Normocephalic, without obvious abnormality, atraumatic. Eyes: Conjunctivae clear, anicteric sclerae. Pupils are equal ENT Nares normal. No drainage or sinus tenderness. Lips, mucosa, and tongue normal. No Thrush Neck: Supple, symmetrical, no adenopathy, thyroid: non tender no carotid bruit and no JVD. Back: No CVA tenderness. Lungs: Clear to auscultation bilaterally. No Wheezing or Rhonchi. No rales. Heart: Regular rate and rhythm, no murmur, rub or gallop. Abdomen: Soft, non-tender,not distended. Bowel sounds normal. No masses Extremities: atraumatic, no cyanosis. No edema. No clubbing Skin: No rashes or lesions. Or bruising Lymph: Cervical, supraclavicular  normal. Neurologic: weakness all extremities-rt > left  Lab Results Recent Labs    11/23/20 0556 11/24/20 0440 11/25/20 0611  WBC 10.9* 10.0 8.7  HGB 9.4* 9.7* 9.3*  HCT 29.4* 30.4* 28.4*  NA 136 135  --   K 4.4 4.7  --   CL 101 106  --   CO2 29 26  --   BUN 9 8  --   CREATININE 0.51 0.56  --    Liver Panel Recent Labs    11/23/20 0556 11/24/20 0440  PROT 6.3* 6.1*  ALBUMIN 2.5* 2.5*  AST 28 23  ALT 5 <5  ALKPHOS 58 58  BILITOT 0.9 0.7   Microbiology: CSF culture 11/20/20 NG HSV PCR CSF= Neg Cryptococcal antigen neg  Studies/Results: No results found.   Assessment/Plan: Admitted with acute change in mental status and had a fever one time, pancytopenia, h/o CLL, treated severe disseminated  pneumococcal infection in feb 2022 Encephalopathy- encephalitis and meningitis ruled out. Increased protein in csf Pancytopenia- wbc has improved Low platelet persist Hb low but improved Vitmin B12 Okay Folate okay Serum ferritin increased  With encephalopathy and pancytopenia r/o , thiamine deficiency, TTP, ?? 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

## 2020-11-25 NOTE — Consult Note (Signed)
Neurology Consultation Note  Consult Requested by: Dr. Priscella Mann  Reason for Consult: AMS  Consult Date: 11/25/20   The history was obtained from the Dr. Priscella Mann, pt husband and electronic chart.  During history and examination, all items were able to obtain unless otherwise noted.  History of Present Illness:  Tonya Myers is a 55 y.o. African American female with PMH of HTN, DM, CLL, spinal epidural abscess in 05/2020 admitted for AMS, fever, lethargy, and confusion. Per husband, pt had oral and throat thrash at home PTA with decreased appetite and vomiting, when working with therapist, she was found to be lethargic and EMS called. On their assessment, pt was found to have fever 102 and AMS with confusion and left sided weakness. She was admitted for further work up. Since admission, pt had no fever except one time 100.4 about 2 days ago. Found to have pancytopenia, oncology was consulted due to hx of CLL, but so far no concern of CLL relapse. ID also on board, initially concerning for meningitis due to hx of pneumococcal meningitis but this time CSF showed 5 WBC and 0 RBC, protein 158 and glucose 110.  Blood culture negative.  ID considered less likely infectious source. B12= 761 and FA 13.2. ammonia 17. LFT normal. Pt mental status did not improve, therefore pan CT was done showed acute pancreatitis, elevated lipase but improving over time. Psychiatry also consulted, but felt not related to psychiatric disorder. Pt has claustrophobia, MRI attempted even with ativan pre-treatment was not successful. Given continue AMS without clear etiology, neurology was consulted.   In 05/2020 patient admitted for altered mental status, tachycardia and aphasia.  Also found to have AKI, hyperglycemia and leukocytosis.  MRI brain negative, however patient continued to have fever and encephalopathy.  CSF showed WBC 63,442, RBC 20,290, protein more than 600, glucose less than 20.  Blood culture and CSF culture showed  strep pneumo.  MRI spine showed septic arthritis of lumbar spine and paraspinal muscle abscess.  Treated with antibiotics.  On 06/16/2020, patient had worsening neuro condition, repeat MRI spine showed spinal epidural abscess with cord compression.  Neurosurgery consulted stat post laminectomy and abscess washout.  Patient symptoms gradually improved and discharged to [name].  Per husband, patient still has residual right arm and leg weakness and was undergoing home health therapy before this admission.  In 09/2020 patient admitted again for stuttering speech.  Difficulty to get MRI due to claustrophobia, therefore MRI was not done.  She was put on aspirin 81 and continue home statin.   Past Medical History:  Diagnosis Date   Acute hemorrhoid 03/11/2015   Anxiety    Arthritis    Asthma    Cancer ([place])    Chronic back pain    CLL (chronic lymphocytic leukemia) ([place])    Depression    Diabetes mellitus without complication ([place])    Genital herpes    type 2   Hypertension    Hypothyroidism    Vertigo     Past Surgical History:  Procedure Laterality Date   ABDOMINAL HYSTERECTOMY     BILATERAL SALPINGOOPHORECTOMY  2009   BREAST BIOPSY Right 05/17/2016   FIBROADENOMATOUS CHANGE AND SCLEROSING ADENOSIS WITH   COLONOSCOPY WITH PROPOFOL N/A 06/16/2015   Procedure: COLONOSCOPY WITH PROPOFOL;  Surgeon: [name], MD;  Location[facility] ENDOSCOPY;  Service: Endoscopy;  Laterality: N/A;   LAPAROSCOPIC SUPRACERVICAL HYSTERECTOMY  2009   due to [place]  Bilateral 06/17/2020   Procedure: THORACIC LAMINECTOMY FOR EPIDURAL ABSCESS;  Surgeon: Deetta Perla, MD;  Location: [facility] ORS;  Service: Neurosurgery;  Laterality: Bilateral;    Family History  Problem Relation Age of Onset   Cancer Paternal Aunt    Breast cancer Maternal Aunt 70    Social History:  reports that she has never smoked. She has never used smokeless tobacco. She reports  current alcohol use of about 1.0 standard drink of alcohol per week. She reports that she does not use drugs.  Allergies:  Allergies  Allergen Reactions   Amoxapine    Penicillins     No current facility-administered medications on file prior to encounter.   Current Outpatient Medications on File Prior to Encounter  Medication Sig Dispense Refill   acetaminophen (TYLENOL) 325 MG tablet Take 2 tablets (650 mg total) by mouth every 6 (six) hours as needed for mild pain (or Fever >/= 101).     albuterol (VENTOLIN HFA) 108 (90 Base) MCG/ACT inhaler Inhale into the lungs.     aspirin EC 81 MG tablet Take 1 tablet (81 mg total) by mouth daily. Swallow whole. 30 tablet 11   diclofenac Sodium (VOLTAREN) 1 % GEL Apply 2 g topically 4 (four) times daily. 2 g 0   diltiazem (DILACOR XR) 120 MG 24 hr capsule Take 1 capsule (120 mg total) by mouth daily. 30 capsule 0   DULoxetine (CYMBALTA) 30 MG capsule Take 1 capsule (30 mg total) by mouth at bedtime. X 1 week- then 2 capsule/60 mg nightly- for nerve pain- 60 capsule 3   ezetimibe (ZETIA) 10 MG tablet Take 1 tablet (10 mg total) by mouth daily. 30 tablet 0   fluticasone (FLONASE) 50 MCG/ACT nasal spray 1 spray 2 (two) times daily.     Insulin Glargine-Lixisenatide 100-33 UNT-MCG/ML SOPN Inject 50 Units into the skin every morning.     levalbuterol (XOPENEX) 1.25 MG/3ML nebulizer solution Take 1.25 mg by nebulization every 6 (six) hours as needed.     levothyroxine (SYNTHROID) 150 MCG tablet Take 1 tablet (150 mcg total) by mouth daily before breakfast. 30 tablet 0   magnesium oxide (MAG-OX) 400 MG tablet Take 400 mg by mouth 2 (two) times daily.     metFORMIN (GLUCOPHAGE-XR) 500 MG 24 hr tablet Take 1 tablet (500 mg total) by mouth 2 (two) times daily. (Patient taking differently: Take 1,000 mg by mouth daily. With dinner.) 60 tablet 0   methocarbamol (ROBAXIN) 750 MG tablet Take 750 mg by mouth 3 (three) times daily.     montelukast (SINGULAIR) 10 MG  tablet Take 1 tablet (10 mg total) by mouth at bedtime. 30 tablet 0   pantoprazole (PROTONIX) 40 MG tablet Take 1 tablet (40 mg total) by mouth daily. 30 tablet 0   pregabalin (LYRICA) 100 MG capsule Take by mouth 2 (two) times daily.     rosuvastatin (CRESTOR) 10 MG tablet Take 1 tablet (10 mg total) by mouth at bedtime. 30 tablet 0   traMADol (ULTRAM) 50 MG tablet Take 1 tablet (50 mg total) by mouth every 6 (six) hours as needed for moderate pain. 60 tablet 5   valACYclovir (VALTREX) 1000 MG tablet Take 1 tablet (1,000 mg total) by mouth 2 (two) times daily. 60 tablet 1   Continuous Blood Gluc Sensor (DEXCOM G6 SENSOR) MISC SMARTSIG:1 Each Topical Every 10 Days     methocarbamol (ROBAXIN) 500 MG tablet Take 1 tablet (500 mg total) by mouth 3 (three) times daily. (Patient  not taking: Reported on 11/20/2020) 90 tablet 0   [DISCONTINUED] budesonide-formoterol (SYMBICORT) 160-4.5 MCG/ACT inhaler Inhale into the lungs.      Review of Systems: A full ROS was attempted today and was able to be performed.  Systems assessed include - Constitutional, Eyes, HENT, Respiratory, Cardiovascular, Gastrointestinal, Genitourinary, Integument/breast, Hematologic/lymphatic, Musculoskeletal, Neurological, Behavioral/Psych, Endocrine, Allergic/Immunologic - with pertinent responses as per HPI.  Physical Examination: Temp:  [98.3 F (36.8 C)-99.9 F (37.7 C)] 99.3 F (37.4 C) (08/02 1125) Pulse Rate:  [81-115] 103 (08/02 1125) Resp:  [15-18] 15 (08/02 1125) BP: (123-144)/(76-91) 123/82 (08/02 1125) SpO2:  [96 %-99 %] 99 % (08/02 1125)  General - thin built, well developed, in no apparent distress.    Ophthalmologic - fundi not visualized due to noncooperation.    Cardiovascular - regular rhythm but mild tachycardia  Mental Status -  Level of arousal and orientation to place, and person were intact, however not orientated to year, and when asking about months she was perseverated on the year. Language exam  showed paucity of speech, however, able to name 2/3 and repeat simple sentences.  Following most simple commands.  Cranial Nerves II - XII - II - Vision intact OU. III, IV, VI - Extraocular movements intact. V - Facial sensation intact bilaterally. VII - Facial movement intact bilaterally. VIII - Hearing & vestibular intact bilaterally. X - Palate elevates symmetrically. XI - Chin turning & shoulder shrug intact bilaterally. XII - Tongue protrusion intact.  Motor Strength - The patient's strength was symmetrical bilateral upper extremities,3+/5 with slow drift bilaterally.  Left lower extremity 3/5, right lower extremity 2/5.  Motor Tone & Bulk - Muscle tone was assessed at the neck and appendages and was mildly decreased bulk was normal and fasciculations were absent.   Reflexes - The patient's reflexes were 1+ in all extremities and she had no pathological reflexes.  Sensory - Light touch, temperature/pinprick were assessed and were subjectively symmetrical.    Coordination - The patient had normal movements in the hands with no ataxia or dysmetria although slow in action.  Tremor was absent.  Gait and Station - deferred  Data Reviewed: CT Head Wo Contrast  Result Date: 11/19/2020 CLINICAL DATA:  Mental status changes of unknown cause. EXAM: CT HEAD WITHOUT CONTRAST TECHNIQUE: Contiguous axial images were obtained from the base of the skull through the vertex without intravenous contrast. COMPARISON:  10/02/2020 FINDINGS: Brain: The brain shows a normal appearance without evidence of malformation, atrophy, old or acute small or large vessel infarction, mass lesion, hemorrhage, hydrocephalus or extra-axial collection. Vascular: No hyperdense vessel. No evidence of atherosclerotic calcification. Skull: Normal.  No traumatic finding.  No focal bone lesion. Sinuses/Orbits: Sinuses are clear. Orbits appear normal. Mastoids are clear. Other: None significant IMPRESSION: Normal head CT.  Electronically Signed   By: Nelson Chimes M.D.   On: 11/19/2020 16:19   CT HEAD W & WO CONTRAST  Result Date: 11/21/2020 CLINICAL DATA:  Metastatic disease evaluation. Additional provided: Patient with history of recent pneumococcal meningitis and bacteremia, diabetes mellitus presenting with altered mental status. EXAM: CT HEAD WITHOUT AND WITH CONTRAST TECHNIQUE: Contiguous axial images were obtained from the base of the skull through the vertex without and with intravenous contrast CONTRAST:  163mL OMNIPAQUE IOHEXOL 350 MG/ML SOLN COMPARISON:  Prior head CT examinations 11/19/2020 and earlier. FINDINGS: Brain: Cerebral volume is normal for age. There is no acute intracranial hemorrhage. No demarcated cortical infarct. No extra-axial fluid collection. No evidence of an intracranial mass.  No midline shift. No pathologic intracranial enhancement is identified. Vascular: Atherosclerotic calcifications. Skull: Normal. Negative for fracture or focal suspicious osseous lesion. Sinuses/Orbits: Visualized orbits show no acute finding. Minimal frothy secretions within the left sphenoid sinus. Small right sphenoid sinus mucous retention cyst. Minimal partial opacification of the posterior right ethmoid air cells. IMPRESSION: No evidence of acute intracranial abnormality. No CT evidence of intracranial metastatic disease. Mild paranasal sinus disease at the imaged levels, as described. Electronically Signed   By: [name] DO   On: 11/21/2020 15:42   CT CHEST ABDOMEN PELVIS W CONTRAST  Result Date: 11/21/2020 CLINICAL DATA:  Altered mental status. Recent history of pneumococcal meningitis and bacteremia. Coffee ground emesis. EXAM: CT CHEST, ABDOMEN, AND PELVIS WITH CONTRAST TECHNIQUE: Multidetector CT imaging of the chest, abdomen and pelvis was performed following the standard protocol during bolus administration of intravenous contrast. CONTRAST:  100 mL OMNIPAQUE IOHEXOL 350 MG/ML SOLN COMPARISON:  CT abdomen  and pelvis 05/23/2020. FINDINGS: CT CHEST FINDINGS Cardiovascular: There is mild cardiomegaly and a small pericardial effusion. No atherosclerosis is seen. Mediastinum/Nodes: Innumerable axillary and subpectoral lymph nodes are new since the prior chest CT. Index node in the right axilla measuring 1 cm short axis dimension is seen on image 21 of series 3. A left subpectoral node measuring 1.3 cm is seen on image 24. Small mediastinal lymph nodes include a precarinal node on image 27 measuring 1 cm. The esophagus and thyroid are negative. Lungs/Pleura: There is some dependent atelectasis. A 0.7 x 0.5 cm right upper lobe nodule on image 72 of series 6 measure approximately 0.7 x 0.4 cm on the prior exam. There is extensive micronodularity in the right upper lobe. Musculoskeletal: No acute or focal abnormality CT ABDOMEN PELVIS FINDINGS Hepatobiliary: No focal liver abnormality is seen. No gallstones, gallbladder wall thickening, or biliary dilatation. The liver is diffusely low attenuating consistent with marked fatty infiltration. Pancreas: [place] is present about the pancreas throughout is consistent with acute pancreatitis. No focal fluid collection. The pancreas enhances homogeneously. Spleen: Normal in size without focal abnormality. Adrenals/Urinary Tract: The adrenal glands appear normal. A single small cyst is seen in the left kidney. On delayed imaging, there are areas of decreased attenuation in both kidneys which may be artifactual. No hydronephrosis. No ureteral or urinary bladder stone. Stomach/Bowel: Stomach is within normal limits. Appendix appears normal. No evidence of bowel wall thickening, distention, or inflammatory changes. Vascular/Lymphatic: No significant vascular findings are present. No enlarged abdominal or pelvic lymph nodes. Reproductive: Status post hysterectomy. No adnexal masses. Other: None. Musculoskeletal: No acute or focal bony abnormality. IMPRESSION: The examination is positive  for findings consistent with acute pancreatitis without pseudocyst formation or pancreatic necrosis. Micronodularity in the right upper lobe is worrisome for bronchopneumonia. 0.7 x 0.5 cm right upper lobe nodule has mildly increased in size since 2017. Non-contrast chest CT at 6-12 months is recommended. If the nodule is stable at time of repeat CT, then future CT at 18-24 months (from today's scan) is considered optional for low-risk patients, but is recommended for high-risk patients. This recommendation follows the consensus statement: Guidelines for Management of Incidental Pulmonary Nodules Detected on CT Images: From the Fleischner Society 2017; Radiology 2017; 284:228-243. Multiple axillary and subpectoral lymph nodes are abnormal in number and likely related to this patient's history of CLL. Cardiomegaly and small pericardial effusion. Marked fatty infiltration of the liver. Decreased cortical attenuation the kidneys is likely artifactual. Correlation with urinalysis could be used to exclude pyelonephritis. Electronically  Signed   By: Inge Rise M.D.   On: 11/21/2020 16:55   [place] UPPER EXTREMITY DUPLEX BILATERAL (NON-WBI)  Result Date: 11/05/2020 CLINICAL DATA:  Numbness, tingling and coldness in the upper extremities EXAM: BILATERAL UPPER EXTREMITY ARTERIAL DUPLEX SCAN TECHNIQUE: Gray-scale sonography as well as color Doppler and duplex ultrasound was performed to evaluate the arteries of both upper extremities. COMPARISON:  None. FINDINGS: Right upper extremity: Normal triphasic arterial waveforms throughout the subclavian, axillary, brachial, radial and ulnar artery. No focal atherosclerotic plaque. No evidence of stenosis or occlusion. Left upper extremity: Normal triphasic arterial waveforms throughout the subclavian, axillary, brachial, radial and ulnar artery. No focal atherosclerotic plaque. No evidence of stenosis or occlusion. IMPRESSION: 1. No evidence of hemodynamically significant  stenosis or occlusion in either upper extremity arterial tree. 2. Incidentally and incompletely imaged prominent lymph nodes in the axillary spaces bilaterally. These are of uncertain significance and may be reactive in nature. If the patient has palpable lymphadenopathy, or other signs and symptoms suspicious for a lymphoproliferative process, then further imaging (contrast-enhanced CT scan of the chest) may be warranted. Electronically Signed   By: Jacqulynn Cadet M.D.   On: 11/05/2020 15:46   DG Chest Portable 1 View  Result Date: 11/19/2020 CLINICAL DATA:  Cough, fever EXAM: PORTABLE CHEST 1 VIEW COMPARISON:  10/01/2020 FINDINGS: Heart size is upper limits of normal. Low lung volumes. Mildly increased interstitial markings bilaterally. No lobar consolidation. No pleural effusion or pneumothorax. IMPRESSION: Low lung volumes with mildly increased interstitial markings bilaterally may reflect mild edema or atypical/viral infection. Electronically Signed   By: Davina Poke D.O.   On: 11/19/2020 16:22   DG FL GUIDED LUMBAR PUNCTURE  Result Date: 11/20/2020 CLINICAL DATA:  Altered mental status EXAM: DIAGNOSTIC LUMBAR PUNCTURE UNDER FLUOROSCOPIC GUIDANCE COMPARISON:  None FLUOROSCOPY TIME:  Fluoroscopy Time:  12 seconds Radiation Exposure Index (if provided by the fluoroscopic device): 1.6 mGy PROCEDURE: Informed consent was obtained from the patient's husband prior to the procedure, including potential complications of headache, allergy, and pain. With the patient prone, the lower back was prepped with Betadine. 1% Lidocaine was used for local anesthesia. Lumbar puncture was performed at the L4-L5 level using a 20 gauge needle with return of clear CSF. 8 ml of CSF were obtained for laboratory studies. The patient tolerated the procedure well and there were no apparent complications. IMPRESSION: Technically successful fluoroscopic guided lumbar puncture. Electronically Signed   By: Macy Mis M.D.    On: 11/20/2020 11:42    Assessment: 55 y.o. female with PMH of HTN, DM, CLL, spinal epidural abscess in 05/2020 admitted for AMS, fever 102, lethargy, and confusion.  However, during the hospitalization, patient only had 1 time temperature 100.4 but otherwise afebrile.  Had initial pancytopenia but now much improved, oncology ruled out CLL relapse.  ID on board, CSF clean, blood culture negative, consider less likely infectious source.  Pan CT showed acute pancreatitis with elevated lipase but also improving.  B12, folate acid, LFT and ammonia level normal.  Psychiatry also consulted for altered mental status but not consistent with psychiatric disorder.  Given continued altered mental status, neurology was consulted.  Per Dr. Priscella Mann, this morning pt still has continued AMS but on my examination later today patient mental status much improved from yesterday, currently awake alert, orientated x2, follows simple commands.  Still has residual right lower extremity weaker than left.   Etiology for patient altered mental status not quite clear, however, still more consistent with metabolic encephalopathy  in the setting of acute pancreatitis, fever, deconditioning superimposed on a debilitated baseline.  Given her poor appetite and vomiting before admission, nutritional etiology also may play a role in her case.  So far B12 and folate acid normal, will add thiamine.  We will pursue EEG to rule out seizure given some fluctuation of mental status on exam today.  Okay to hold off MRI for now, if patient mental status getting worse, may need anesthesia for whole neuro axis MRI including brain and spine.  Plan: -Continue monitor neuro improvement -EEG to rule out seizure -Thiamine supplement, consider 500 mg 3 times daily for 3 days -Okay to hold off MRI for now, if patient mental status getting worse, may need anesthesia for whole neuro axis MRI including brain and spine. -neurology will continue to  follow  Thank you for this consultation and allowing [place] to participate in the care of this patient.  Rosalin Hawking, MD PhD [facility] 11/25/2020 12:58 PM

## 2020-11-25 NOTE — Telephone Encounter (Signed)
Called to speak with patient re; VV appt on 0/03/22.  The gentleman that answered states that she is currently in the hospital and will need to reschedule the appt.

## 2020-11-25 NOTE — Progress Notes (Signed)
Initial Nutrition Assessment  DOCUMENTATION CODES:  Not applicable  INTERVENTION:  Continue current diet as ordered, encourage PO intake Snacks TID between meals MVI with minerals daily Boost Breeze po TID, each supplement provides 250 kcal and 9 grams of protein New measured weight  NUTRITION DIAGNOSIS:  Inadequate oral intake related to  (altered mental status) as evidenced by meal completion < 25%.  GOAL:  Patient will meet greater than or equal to 90% of their needs  MONITOR:  PO intake, Supplement acceptance, Labs, Weight trends  REASON FOR ASSESSMENT:  Consult Poor PO  ASSESSMENT:  55 y.o. female with a past medical history of anxiety/depression, CLL, diabetes, HLD, and HTN presented to ED with AMS and poor appetite.  Pt has undergone extensive workup for cause of AMS without clear cause. Husband reports pt had meningitis earlier this year and then went to rehab for ~ 1 month after her hospitalization due to deconditioning. Reports that pt has been working with PT at home and was ambulating with a walker but is still not back to her baseline functional status.  Pt resting in bed at the time of visit. Will answer some questions, but mostly closes eyes. Husband able to provide a hx. States that until early July, pt's appetite had been great and eating her usual amount. At that point, pt was dx with thrush and appetite declined due to pain with eating. Poor intake has continued despite treatment for thrush. Husband reports she ate a few pieces of watermelon this AM, but that was all.   Discussed nutrition supplements, pt is not drinking much and does not like the milk-based shakes. Is agreeable to boost breeze. Was also able to obtain food preferences, will send snacks between meals to try and encourage increased intake.   Muscle and fat stores are intact at this time. Will continue to monitor for deficits and watch intake trends to determine if more aggressive measures are  needed. Noted that thiamine is being supplemented in the event AMS is caused by deficiency.   Average Meal Intake: 7/29-8/2: 0% intake x 4 recorded meals  Nutritionally Relevant Medications: Scheduled Meds:  insulin aspart  0-15 Units Subcutaneous Q4H   insulin glargine-yfgn  25 Units Subcutaneous Daily   mirtazapine  15 mg Oral QHS   nystatin  5 mL Oral QID   pantoprazole  40 mg Oral Daily   rosuvastatin  10 mg Oral QHS   Continuous Infusions:  lactated ringers 100 mL/hr at 11/25/20 0010   PRN Meds: ondansetron  Labs Reviewed: Lipase 325  NUTRITION - FOCUSED PHYSICAL EXAM: Flowsheet Row Most Recent Value  Orbital Region No depletion  Upper Arm Region No depletion  Thoracic and Lumbar Region No depletion  Buccal Region No depletion  Temple Region No depletion  Clavicle Bone Region Mild depletion  Clavicle and Acromion Bone Region No depletion  Scapular Bone Region No depletion  Dorsal Hand No depletion  Patellar Region No depletion  Anterior Thigh Region No depletion  Posterior Calf Region No depletion  Edema (RD Assessment) Mild  Hair Reviewed  Eyes Reviewed  Mouth Reviewed  Skin Reviewed  Nails Reviewed   Diet Order:   Diet Order             DIET SOFT Room service appropriate? Yes; Fluid consistency: Thin  Diet effective now                  EDUCATION NEEDS:  No education needs have been identified at this time  Skin:  Skin Assessment: Reviewed RN Assessment  Last BM:  7/28  Height:  Ht Readings from Last 1 Encounters:  11/20/20 6' (1.829 m)    Weight:  Wt Readings from Last 1 Encounters:  11/20/20 83 kg    Ideal Body Weight:  72.7 kg  BMI:  Body mass index is 24.82 kg/m.  Estimated Nutritional Needs:  Kcal:  2000-2200 kcal/d Protein:  100-110 g/d Fluid:  >2L/d  Ranell Patrick, RD, LDN Clinical Dietitian Pager on [facility]

## 2020-11-25 NOTE — Progress Notes (Signed)
   11/25/20 0451  Assess: MEWS Score  Temp 98.9 F (37.2 C)  BP (!) 142/79  Pulse Rate (!) 115  Resp 18  SpO2 97 %  Assess: MEWS Score  MEWS Temp 0  MEWS Systolic 0  MEWS Pulse 2  MEWS RR 0  MEWS LOC 0  MEWS Score 2  MEWS Score Color Yellow  Assess: if the MEWS score is Yellow or Red  Were vital signs taken at a resting state? Yes  Focused Assessment Change from prior assessment (see assessment flowsheet)  Does the patient meet 2 or more of the SIRS criteria? No  MEWS guidelines implemented *See Row Information* Yes  Treat  MEWS Interventions Administered scheduled meds/treatments  Escalate  MEWS: Escalate Yellow: discuss with charge nurse/RN and consider discussing with provider and RRT  Notify: Charge Nurse/RN  Name of Charge Nurse/RN Notified Amber B. RN  Date Charge Nurse/RN Notified 11/25/20  Time Charge Nurse/RN Notified 0507  Notify: Provider  Provider Name/Title Hal Hope  Date Provider Notified 11/25/20  Time Provider Notified [phone]  Notification Type  (secure chat)  Notification Reason Change in status  Document  Progress note created (see row info) Yes  Assess: SIRS CRITERIA  SIRS Temperature  0  SIRS Pulse 1  SIRS Respirations  0  SIRS WBC 0  SIRS Score Sum  1  MD notified awaiting response. Will continue to monitor per MEWS protocol

## 2020-11-25 NOTE — Progress Notes (Signed)
   11/25/20 0533 Notify: Provider Provider Name/Title Hal Hope Date Provider Notified 11/25/20 Time Provider Notified [phone] Notification Type  (secure chat) Notification Reason Other (Comment) (HR in 120's  Awaiting response

## 2020-11-26 ENCOUNTER — Telehealth: Payer: Medicare Other | Admitting: Student in an Organized Health Care Education/Training Program

## 2020-11-26 DIAGNOSIS — K859 Acute pancreatitis without necrosis or infection, unspecified: Secondary | ICD-10-CM

## 2020-11-26 DIAGNOSIS — G928 Other toxic encephalopathy: Secondary | ICD-10-CM | POA: Diagnosis not present

## 2020-11-26 DIAGNOSIS — R4701 Aphasia: Secondary | ICD-10-CM | POA: Diagnosis not present

## 2020-11-26 DIAGNOSIS — G9340 Encephalopathy, unspecified: Secondary | ICD-10-CM | POA: Diagnosis not present

## 2020-11-26 LAB — BASIC METABOLIC PANEL
Anion gap: 4 — ABNORMAL LOW (ref 5–15)
BUN: 9 mg/dL (ref 6–20)
CO2: 24 mmol/L (ref 22–32)
Calcium: 7.5 mg/dL — ABNORMAL LOW (ref 8.9–10.3)
Chloride: 107 mmol/L (ref 98–111)
Creatinine, Ser: 0.89 mg/dL (ref 0.44–1.00)
GFR, Estimated: >60 mL/min (ref >60)
Glucose, Bld: 68 mg/dL — ABNORMAL LOW (ref 70–99)
Potassium: 3.7 mmol/L (ref 3.5–5.1)
Sodium: 135 mmol/L (ref 135–145)

## 2020-11-26 LAB — MAGNESIUM: Magnesium: 1.5 mg/dL — ABNORMAL LOW (ref 1.7–2.4)

## 2020-11-26 LAB — CBC WITH DIFFERENTIAL/PLATELET
Abs Immature Granulocytes: 0.04 10*3/uL (ref 0.00–0.07)
Basophils Absolute: 0 10*3/uL (ref 0.0–0.1)
Basophils Relative: 0 %
Eosinophils Absolute: 0.1 10*3/uL (ref 0.0–0.5)
Eosinophils Relative: 1 %
HCT: 24.4 % — ABNORMAL LOW (ref 36.0–46.0)
Hemoglobin: 7.8 g/dL — ABNORMAL LOW (ref 12.0–15.0)
Immature Granulocytes: 1 %
Lymphocytes Relative: 12 %
Lymphs Abs: 0.9 10*3/uL (ref 0.7–4.0)
MCH: 26.6 pg (ref 26.0–34.0)
MCHC: 32 g/dL (ref 30.0–36.0)
MCV: 83.3 fL (ref 80.0–100.0)
Monocytes Absolute: 0.4 10*3/uL (ref 0.1–1.0)
Monocytes Relative: 5 %
Neutro Abs: 5.8 10*3/uL (ref 1.7–7.7)
Neutrophils Relative %: 81 %
Platelets: 75 10*3/uL — ABNORMAL LOW (ref 150–400)
RBC: 2.93 MIL/uL — ABNORMAL LOW (ref 3.87–5.11)
RDW: 17.6 % — ABNORMAL HIGH (ref 11.5–15.5)
WBC: 7.2 10*3/uL (ref 4.0–10.5)
nRBC: 0 % (ref 0.0–0.2)

## 2020-11-26 LAB — GLUCOSE, CAPILLARY
Glucose-Capillary: 86 mg/dL (ref 70–99)
Glucose-Capillary: 68 mg/dL — ABNORMAL LOW (ref 70–99)
Glucose-Capillary: 67 mg/dL — ABNORMAL LOW (ref 70–99)
Glucose-Capillary: 79 mg/dL (ref 70–99)
Glucose-Capillary: 153 mg/dL — ABNORMAL HIGH (ref 70–99)

## 2020-11-26 LAB — PHOSPHORUS: Phosphorus: 3.2 mg/dL (ref 2.5–4.6)

## 2020-11-26 LAB — THYROID PANEL WITH TSH
Free Thyroxine Index: 2.4 (ref 1.2–4.9)
T3 Uptake Ratio: 33 % (ref 24–39)
T4, Total: 7.3 ug/dL (ref 4.5–12.0)
TSH: 3.03 u[IU]/mL (ref 0.450–4.500)

## 2020-11-26 LAB — LIPASE, BLOOD: Lipase: 372 U/L — ABNORMAL HIGH (ref 11–51)

## 2020-11-26 MED ORDER — THIAMINE HCL 100 MG PO TABS
500.0000 mg | ORAL_TABLET | Freq: Three times a day (TID) | ORAL | Status: AC
  Administered 2020-11-26 – 2020-11-28 (×6): 500 mg via ORAL
  Filled 2020-11-26 (×6): qty 5

## 2020-11-26 MED ORDER — POLYETHYLENE GLYCOL 3350 17 G PO PACK
34.0000 g | PACK | Freq: Two times a day (BID) | ORAL | Status: DC
  Administered 2020-11-26 – 2020-11-28 (×4): 34 g via ORAL
  Filled 2020-11-26 (×6): qty 2

## 2020-11-26 MED ORDER — MAGNESIUM OXIDE -MG SUPPLEMENT 400 (240 MG) MG PO TABS
400.0000 mg | ORAL_TABLET | Freq: Two times a day (BID) | ORAL | Status: DC
  Administered 2020-11-26 (×2): 400 mg via ORAL
  Filled 2020-11-26 (×2): qty 1

## 2020-11-26 MED ORDER — INSULIN GLARGINE-YFGN 100 UNIT/ML ~~LOC~~ SOLN: 10.0000 [IU] | Freq: Every day | SUBCUTANEOUS | Status: DC

## 2020-11-26 MED ORDER — LACTATED RINGERS IV SOLN: INTRAVENOUS | Status: DC

## 2020-11-26 NOTE — Progress Notes (Signed)
Physical Therapy Plan of Care Update   Per conservation with OT after OT evaluation, family currently seeking rehab at DC as pt had decline in functional mobility since AM PT eval. Will update DC recommendations to [name], to address deficits and improve overall safety with functional mobility prior to return home.     Herminio Commons, PT, DPT 12:49 PM,11/26/20

## 2020-11-26 NOTE — Progress Notes (Signed)
VAST consulted to obtain IV access. Assessed bilateral upper and lower arms utilizing ultrasound. Attempted to place PIV twice, unsuccessfully. Patient's vessels are too small. Patient has one vessel in upper right arm which might be used for PICC placement; otherwise patient will need CL placed if IV access is needed.  Informed physician and nurse via [place] of findings. Dr. Billie Ruddy asked patient's nurse, Denice Paradise, if patient was drinking enough fluids to leave IV out. Denice Paradise replied that patient is drinking some, but not much and her urine is dark amber in color.

## 2020-11-26 NOTE — Evaluation (Signed)
Occupational Therapy Evaluation Patient Details Name: Tonya Myers MRN: [id number] DOB: 08-18-1965 Today's Date: 11/26/2020    History of Present Illness Pt admitted to [facility] on 11/19/20 for decreased appetite, vomiting, AMS, and lethargy. On EMS assessment, pt was found to have fever 102 and AMS with confusion and left sided weakness.  Found to have pancytopenia, oncology was consulted due to hx of CLL, but so far no concern of CLL relapse. ID also on board, initially concerning for meningitis due to hx of pneumococcal meningitis. PMH significant for: DDD, cervical radiculopathy, anxiety, DM, HTN, hypothyroidism, CLL, asthma, cLBP, vertigo, and hx of stroke.   Clinical Impression   Pt seen for OT evaluation this date. Upon arrival to room, pt seated upright in bed with supportive spouse and neighbor present.  Pt alert and oriented to self, place, and parts of situation, however pt and family reporting cognition different from baseline, with pt now requiring increased processing time. Pt agreeable and motivated to participate in OT eval/tx. Prior to admission, pt was mod I with ADL's. Family reports that pt was able to walk to/from bathroom (~61ft) with RW following progress with HHOT/HHPT, however was typically using a w/c for household mobility. Pt was able to manage transfers requiring only occasional assist from family.   Pt currently presents with impaired cognition, decreased strength, decreased balance, and decreased activity tolerance. Due to these functional impairments, pt requires MIN GUARD (+increased time/effort) to don socks at bed-level, MIN-MOD A for bed mobility, MAX A for sit>stand transfers, and MIN A for grooming hygiene tasks while seated EOB. This date, pt unable to tolerate standing for >5sec with b/l UE support from RW and MAX A. Pt would benefit from additional skilled OT services to maximize return to PLOF and minimize risk of future falls, injury, caregiver burden, and  readmission. Upon discharge, recommend [name].    Follow Up Recommendations  [name]    Equipment Recommendations  Other (comment) (defer to next venue of care)       Precautions / Restrictions Precautions Precautions: Fall Restrictions Weight Bearing Restrictions: No      Mobility Bed Mobility Overal bed mobility: Needs Assistance Bed Mobility: Supine to Sit;Sit to Supine     Supine to sit: Min assist;HOB elevated Sit to supine: Mod assist   General bed mobility comments: MIN-MOD A for BLE management during transfer. Requires verbal cues to sequence    Transfers Overall transfer level: Needs assistance Equipment used: Rolling walker (2 wheeled) Transfers: Sit to/from Stand Sit to Stand: Max assist;From elevated surface         General transfer comment: Requires verbal cues for sequencing and MAX A for sit<>stand transfers    Balance Overall balance assessment: Needs assistance Sitting-balance support: No upper extremity supported;Feet supported Sitting balance-Leahy Scale: Fair Sitting balance - Comments: Fair sitting balance at EOB while participating in seated UB ADLs   Standing balance support: Bilateral upper extremity supported;During functional activity Standing balance-Leahy Scale: Zero Standing balance comment: Requires MAX A to maintain standing balance d/t posterior lean                           ADL either performed or assessed with clinical judgement   ADL Overall ADL's : Needs assistance/impaired     Grooming: Wash/dry face;Supervision/safety;Set up;Oral care;Minimal assistance;Sitting Grooming Details (indicate cue type and reason): MIN A for managing toothpaste lid             Lower Body Dressing:  Set up;Min guard;Bed level Lower Body Dressing Details (indicate cue type and reason): able to don socks with increased time/effort             Functional mobility during ADLs: Maximal assistance;Rolling walker        Pertinent  Vitals/Pain Pain Assessment: 0-10 Faces Pain Scale: Hurts a little bit Pain Location: bil wrist/feet TTP d/t swelling Pain Descriptors / Indicators: Tender Pain Intervention(s): Monitored during session;Repositioned     Hand Dominance Right   Extremity/Trunk Assessment Upper Extremity Assessment Upper Extremity Assessment: Generalized weakness   Lower Extremity Assessment Lower Extremity Assessment: Generalized weakness   Cervical / Trunk Assessment Cervical / Trunk Assessment: Normal   Communication Communication Communication: Expressive difficulties (increased time/effort for processing)   Cognition Arousal/Alertness: Awake/alert Behavior During Therapy: WFL for tasks assessed/performed Overall Cognitive Status: Impaired/Different from baseline                                 General Comments: Pt A&O self, place, and parts of situations. Pleasant and agreeable throughout. Requires increased processing time.   General Comments  +1 non-pitting edema of extremities; HR up to 120bpm with mobility    Exercises Other Exercises Other Exercises: Pt able to participate in bed mobility, transfers, and gait with RW. Required increased assist and multimodal cues for safety and sequencing throughout. Further mobility limited secondary to weakness and fatigue. Other Exercises: Pt and family educated regarding: PT role/POC, DC recommendations, safety with mobility, level of care pt currently requires, benefit of participation/OOB mobility, hospital bed features. They verbalized understanding.        Home Living Family/patient expects to be discharged to:: Private residence Living Arrangements: Spouse/significant other Available Help at Discharge: Family;Available 24 hours/day;Neighbor Type of Home: House Home Access: Level entry     Home Layout: Two level;Able to live on main level with bedroom/bathroom Alternate Level Stairs-Number of Steps: 16   Bathroom  Shower/Tub: Occupational psychologist: Standard Bathroom Accessibility: No (unable to access upstairs bathroom with WIS; has 1/2 bath downstairs)   Home Equipment: Shower seat;Grab bars - toilet;Walker - 2 wheels;Wheelchair - Education officer, community - power;Bedside commode;Hospital bed   Additional Comments: Living on main level of home with hospital bed; no essential needs on upper level of home      Prior Functioning/Environment          Comments: Pt & family report that pt was mod I with ADL's. Family reports that pt was able to walk to/from bathroom (~19ft) with RW following progress with HHOT/HHPT, however was typically using a w/c for household mobility. Pt was able to manage transfers requiring only occasional assist from family. Pt utilizes slide board for car transfers. Pt required assist for IADL's, medication management, and transportation.        OT Problem List: Decreased strength;Decreased range of motion;Decreased activity tolerance;Impaired balance (sitting and/or standing);Decreased cognition      OT Treatment/Interventions: Self-care/ADL training;Therapeutic exercise;Energy conservation;DME and/or AE instruction;Therapeutic activities;Patient/family education;Balance training;Cognitive remediation/compensation    OT Goals(Current goals can be found in the care plan section) Acute Rehab OT Goals Patient Stated Goal: to get stronger OT Goal Formulation: With patient/family Time For Goal Achievement: 12/10/20 Potential to Achieve Goals: Good ADL Goals Pt Will Perform Upper Body Dressing: with supervision;sitting Pt Will Transfer to Toilet: with mod assist;stand pivot transfer;bedside commode Pt Will Perform Toileting - Clothing Manipulation and hygiene: with min guard assist;sitting/lateral leans  OT  Frequency: Min 3X/week    AM-PAC OT "6 Clicks" Daily Activity     Outcome Measure Help from another person eating meals?: A Little Help from another person taking  care of personal grooming?: A Little Help from another person toileting, which includes using toliet, bedpan, or urinal?: A Lot Help from another person bathing (including washing, rinsing, drying)?: A Lot Help from another person to put on and taking off regular upper body clothing?: A Little Help from another person to put on and taking off regular lower body clothing?: A Lot 6 Click Score: 15   End of Session Equipment Utilized During Treatment: Gait belt;Rolling walker Nurse Communication: Mobility status  Activity Tolerance: Patient tolerated treatment well Patient left: in bed;with call bell/phone within reach;with bed alarm set;with family/visitor present  OT Visit Diagnosis: Muscle weakness (generalized) (M62.81);Other symptoms and signs involving cognitive function                Time: 0051-1021 OT Time Calculation (min): 30 min Charges:  OT General Charges $OT Visit: 1 Visit OT Evaluation $OT Eval Moderate Complexity: 1 Mod OT Treatments $Self Care/Home Management : 23-37 mins  Fredirick Maudlin, OTR/L [place]

## 2020-11-26 NOTE — Progress Notes (Addendum)
Neurology Progress Note   S:// Seen and examined.  Awake alert oriented to the fact that she is in the hospital but could not tell me her correct age, current month or why she is in the hospital. Daytime RN reports that overnight, was reported that she was more confused at night   O:// Current vital signs: BP (!) 148/83 (BP Location: Right Arm)   Pulse 87   Temp 98.6 F (37 C)   Resp 16   Ht 6' (1.829 m)   Wt 83 kg   SpO2 97%   BMI 24.82 kg/m  Vital signs in last 24 hours: Temp:  [98 F (36.7 C)-99.5 F (37.5 C)] 98.6 F (37 C) (08/03 0741) Pulse Rate:  [87-103] 87 (08/03 0741) Resp:  [15-17] 16 (08/03 0741) BP: (121-148)/(71-83) 148/83 (08/03 0741) SpO2:  [97 %-99 %] 97 % (08/03 0741) General: Awake alert in no distress HEENT: [place] atraumatic Lungs: Clear Cardiovascular: Regular rhythm Abdomen nondistended nontender Extremities warm well perfused Neurological exam Awake alert oriented to self. Oriented to the fact that she is at [place] me her date of birth correct. Could not tell me her correct age-said she is 55 years old when she is in fact 55 years old. Poor attention concentration Naming comprehension repetition are intact although slow No gross dysarthria Cranial nerves: Pupils equal round react light, extraocular movements intact, visual fields are full, facial sensation intact, face symmetric, tongue and palate midline. Motor examination: She seems to be mildly weaker on the right side in comparison to the left. Right upper extremity is barely 3/5 Right lower extremity is barely 2/5 Left upper and lower extremity are 4/5 She could not tell me she has had a stroke or why she is weak but says she is always been weaker on the right side Sensation intact to touch all over Coordination: Slow movements but no obvious dysmetria   Medications  Current Facility-Administered Medications:    acetaminophen (TYLENOL) tablet 650 mg, 650  mg, Oral, Q6H PRN, 650 mg at 11/22/20 0942 **OR** acetaminophen (TYLENOL) suppository 650 mg, 650 mg, Rectal, Q6H PRN, Cox, Amy N, DO   acetaminophen (TYLENOL) tablet 1,000 mg, 1,000 mg, Oral, Once, Cox, Amy N, DO   albuterol (PROVENTIL) (2.5 MG/3ML) 0.083% nebulizer solution 3 mL, 3 mL, Inhalation, Q6H PRN, Cox, Amy N, DO   chlorhexidine (PERIDEX) 0.12 % solution 15 mL, 15 mL, Mouth Rinse, BID, Sreenath, Sudheer B, MD, 15 mL at 11/25/20 1010   diltiazem (CARDIZEM CD) 24 hr capsule 120 mg, 120 mg, Oral, Daily, Cox, Amy N, DO, 120 mg at 11/25/20 0546   feeding supplement (BOOST / RESOURCE BREEZE) liquid 1 Container, 1 Container, Oral, TID BM, Priscella Mann, Sudheer B, MD, 1 Container at 11/25/20 2002   fluconazole (DIFLUCAN) tablet 100 mg, 100 mg, Oral, Daily, Sreenath, Sudheer B, MD, 100 mg at 11/25/20 1010   hydrALAZINE (APRESOLINE) tablet 50 mg, 50 mg, Oral, Q8H, Sreenath, Sudheer B, MD, 50 mg at 11/26/20 0535   insulin aspart (novoLOG) injection 0-15 Units, 0-15 Units, Subcutaneous, Q4H, Sreenath, Sudheer B, MD, 2 Units at 11/25/20 1800   ketorolac (TORADOL) 30 MG/ML injection 15 mg, 15 mg, Intravenous, Q6H PRN, Priscella Mann, Sudheer B, MD, 15 mg at 11/25/20 0458   levothyroxine (SYNTHROID) tablet 100 mcg, 100 mcg, Oral, QAC breakfast, Priscella Mann, Sudheer B, MD, 100 mcg at 11/26/20 0535   magic mouthwash w/lidocaine, 15 mL, Oral, TID, Priscella Mann, Sudheer B, MD, 15 mL at 11/25/20 1621   MEDLINE  mouth rinse, 15 mL, Mouth Rinse, q12n4p, Sreenath, Sudheer B, MD, 15 mL at 11/25/20 1625   mirtazapine (REMERON) tablet 15 mg, 15 mg, Oral, QHS, Sreenath, Sudheer B, MD, 15 mg at 11/25/20 2047   multivitamin with minerals tablet 1 tablet, 1 tablet, Oral, Daily, Priscella Mann, Sudheer B, MD, 1 tablet at 11/25/20 1626   nystatin (MYCOSTATIN) 100000 UNIT/ML suspension 500,000 Units, 5 mL, Oral, QID, Priscella Mann, Sudheer B, MD, 500,000 Units at 11/25/20 1842   ondansetron (ZOFRAN) tablet 4 mg, 4 mg, Oral, Q6H PRN **OR** ondansetron  (ZOFRAN) injection 4 mg, 4 mg, Intravenous, Q6H PRN, Cox, Amy N, DO, 4 mg at 11/23/20 0751   pantoprazole (PROTONIX) EC tablet 40 mg, 40 mg, Oral, Daily, Cox, Amy N, DO, 40 mg at 11/25/20 1010   rosuvastatin (CRESTOR) tablet 10 mg, 10 mg, Oral, QHS, Cox, Amy N, DO, 10 mg at 11/25/20 2047   thiamine 500mg  in normal saline (68ml) IVPB, 500 mg, Intravenous, TID, Ralene Muskrat B, MD, Stopped at 11/26/20 0531 Labs CBC    Component Value Date/Time   WBC 7.2 11/26/2020 0451   RBC 2.93 (L) 11/26/2020 0451   HGB 7.8 (L) 11/26/2020 0451   HCT 24.4 (L) 11/26/2020 0451   PLT 75 (L) 11/26/2020 0451   MCV 83.3 11/26/2020 0451   MCH 26.6 11/26/2020 0451   MCHC 32.0 11/26/2020 0451   RDW 17.6 (H) 11/26/2020 0451   LYMPHSABS 0.9 11/26/2020 0451   MONOABS 0.4 11/26/2020 0451   EOSABS 0.1 11/26/2020 0451   BASOSABS 0.0 11/26/2020 0451    CMP     Component Value Date/Time   NA 135 11/24/2020 0440   K 4.7 11/24/2020 0440   CL 106 11/24/2020 0440   CO2 26 11/24/2020 0440   GLUCOSE 91 11/24/2020 0440   BUN 8 11/24/2020 0440   CREATININE 0.56 11/24/2020 0440   CALCIUM 7.9 (L) 11/24/2020 0440   PROT 6.1 (L) 11/24/2020 0440   ALBUMIN 2.5 (L) 11/24/2020 0440   AST 23 11/24/2020 0440   ALT <5 11/24/2020 0440   ALKPHOS 58 11/24/2020 0440   BILITOT 0.7 11/24/2020 0440   GFRNONAA >60 11/24/2020 0440   GFRAA >60 01/17/2020 1053    EEG-generalized slowing.  No seizures.   Imaging I have reviewed images in epic and the results pertinent to this consultation are: CT head 11/21/2020-no evidence of acute intracranial abnormality.  No CT evidence of intracranial metastatic disease. CT chest abdomen pelvis consistent with findings of acute pancreatitis without pseudocyst formation or pancreatic necrosis.  Also noted micro nodularity in the right upper lobe worrisome for bronchopneumonia. There is also a 0.7 x 0.5 cm right upper lobe nodule that is mildly increased in size since 2017-please see the  radiologist's follow-up guidelines in the read.  Assessment:  Most likely toxic metabolic encephalopathy There is focal right-sided weakness but based on prior neurology chart review, right-sided weakness is likely baseline and exacerbated in the setting of acute illness.  Recommendations: I had initially thought of obtaining an MRI but review of chart reveals that the right-sided weakness is not new and her mentation although not at baseline is somewhat improving but still is not at baseline-likely secondary to the acute illness. I would continue supportive treatment and medical management has been done by the medicine team. An MRI obviously can be of some help in ruling out a stroke but her clinical picture seems more consistent with encephalopathy even with the finding of focality which is baseline and the fact that  she is extremely claustrophobic and did not tolerate MRI even with Ativan-okay to hold off on the MRI. Continue thiamine supplementation Continue minimizing sedating medications-psychiatry has seen the patient and advised no changes for now.  Appreciate consultation.  No further neurological work-up inpatient at this time  Plan relayed to Dr. Billie Ruddy -- Amie Portland, MD Neurologist [facility] Pager: [phone]

## 2020-11-26 NOTE — Progress Notes (Addendum)
Rehab Admissions Coordinator Note:  Patient was screened by Cleatrice Burke for appropriateness for an Inpatient Acute Rehab admit at [facility] in [place].   At this time, we are recommending Inpatient Rehab consult. I will place order per protocol and an Admissions Coordinator will follow up for full assessment.  Cleatrice Burke RN MSN 11/26/2020, 2:34 PM  I can be reached at [phone].  Notified by Dr Billie Ruddy that family would like to plan discharge home tomorrow. We will not pursue [name] assessment/Consult at this time.  Danne Baxter, RN, MSN Rehab Admissions Coordinator [phone] 11/26/2020 4:12 PM

## 2020-11-26 NOTE — Evaluation (Signed)
Physical Therapy Evaluation Patient Details Name: Tonya Myers MRN: [id number] DOB: 1966/01/11 Today's Date: 11/26/2020   History of Present Illness  Pt admitted to [facility] on 11/19/20 for decreased appetite, vomiting, AMS, and lethargy. On EMS assessment, pt was found to have fever 102 and AMS with confusion and left sided weakness.  Found to have pancytopenia, oncology was consulted due to hx of CLL, but so far no concern of CLL relapse. ID also on board, initially concerning for meningitis due to hx of pneumococcal meningitis. PMH significant for: DDD, cervical radiculopathy, anxiety, DM, HTN, hypothyroidism, CLL, asthma, cLBP, vertigo, and hx of stroke.    Clinical Impression  Pt is a 55 year old F admitted to hospital on 11/19/20 for subacute delirium. At baseline, pt required PRN assist for bed mobility/transfers, IADL's, transportation, medication management, and sponge bathing; otherwise pt states mod I for ADL's and short distance gait with RW (to/from bathroom). Pt presents with generalized weakness, decreased activity tolerance, impaired motor grading/control/planning, +1 non-pitting edema in extremities, decreased gross balance, poor safety awareness, and mild confusion, resulting in impaired functional mobility. Due to deficits, pt required mod assist for bed mobility, mod-max assist for transfers, and mod-max assist to take lateral steps at bedside. Pt required max multimodal cues for safety and sequencing with all functional mobility, but demonstrated improved carryover within session in regards to cueing and physical assist levels. Further mobility limited secondary to fatigue and generalized weakness. Deficits limit the pt's ability to safely and independently perform ADL's, transfer, and ambulate. Due to good carry over within session and high motivation, pt will benefit from acute skilled PT services up to 7x/wk to address deficits for return to baseline function and safe return home. At  this time, PT recommends HHPT with 24/7 care, to address deficits for return to baseline function, as family states they feel comfortable providing level of care pt currently requires.  Of note, family reports that current hospital bed at home is too short and does not provide adequate pressure relief for pt; hospital bed obtained earlier this year. Spoke with family/CM about potentially putting in an appeal to obtain different hospital bed that will cater to the pt's needs. CM following.     Follow Up Recommendations Home health PT;Supervision/Assistance - 24 hour    Equipment Recommendations  Hospital bed (may require different hospital bed, as current one is not long enough and does not provide adequate pressure relief)    Recommendations for Other Services OT consult     Precautions / Restrictions Precautions Precautions: Fall Restrictions Weight Bearing Restrictions: No      Mobility  Bed Mobility Overal bed mobility: Needs Assistance Bed Mobility: Supine to Sit;Sit to Supine     Supine to sit: Mod assist;HOB elevated Sit to supine: Mod assist   General bed mobility comments: Grossly mod assist for trunk/BLE facilitation to perform supine<>sit transfer. HOB elevated when sitting EOB, with hand over hand cues for hand placement and sequencing of task.    Transfers Overall transfer level: Needs assistance Equipment used: Rolling walker (2 wheeled) Transfers: Sit to/from Stand Sit to Stand: Mod assist;Max assist;From elevated surface         General transfer comment: Intermittent assist ranging from mod-max assist for power to stand from elevated bed height with RW. Multimodal cues for hand placement and sequencing. Increased time/effort to achieve full upright standing. Demonstrates "poor" eccentric control with sitting.  Ambulation/Gait Ambulation/Gait assistance: Mod assist;Max assist Gait Distance (Feet): 2 Feet (2 lateral steps x2 (  towards [facility])) Assistive device:  Rolling walker (2 wheeled)       General Gait Details: Mod-max assist to take a few steps towards [facility] with RW. Pt required max multimodal cues for RW proximity/negotiation, balance, and LE sequencing. Pt demonstrates wide BOS, poor balance (M/L/posterior sway), slowed cadence, impaired motor planning/control/grading, R genu recurvatum with R stance, and decreased step length/foot clearance bil. Required x1 seated rest break due to weakness/fatigue.     Balance Overall balance assessment: Needs assistance Sitting-balance support: No upper extremity supported;Feet supported Sitting balance-Leahy Scale: Fair Sitting balance - Comments: fair static, poor dynamic without UE support; unable to don socks while seated EOB   Standing balance support: Bilateral upper extremity supported;During functional activity Standing balance-Leahy Scale: Zero Standing balance comment: Static-dynamic balance ranging from Poor-Zero with BUE support on RW; increased M/L/posterior sway with initial standing and fatigue          Pertinent Vitals/Pain Pain Assessment: Faces Faces Pain Scale: Hurts a little bit Pain Location: bil wrist/feet TTP d/t swelling Pain Intervention(s): Monitored during session;Repositioned    Home Living Family/patient expects to be discharged to:: Private residence Living Arrangements: Spouse/significant other Available Help at Discharge: Family;Available 24 hours/day;Neighbor Type of Home: House Home Access: Level entry     Home Layout: Two level;Able to live on main level with bedroom/bathroom Home Equipment: Shower seat;Grab bars - toilet;Walker - 2 wheels;Wheelchair - Education officer, community - power;Bedside commode;Hospital bed Additional Comments: Living on main level of home with hospital bed; no essential needs on upper level of home    Prior Function           Comments: Pt reports being mod I with ADL's and requiring PRN assist for sponge bathing. Requires assist for  IADL's, medication management, and transportation. Utilizes slide board for car transfers, and was able to walk short distance to bathroom with RW. Otherwise, she grossly uses manual WC for household/limited community ambulation. PRN assist for bed mobility and transfers with RW; spouse notes recently, pt has required increased assist from normal baseline. Was active with HHPT/OT and making progress in functional independence.     Hand Dominance   Dominant Hand: Right    Extremity/Trunk Assessment   Upper Extremity Assessment Upper Extremity Assessment: Generalized weakness (Grossly 3+/5, sensation intact; states "yes" to N/T but unable to state where)    Lower Extremity Assessment Lower Extremity Assessment: Generalized weakness (Grossly 3+/5; light touch sensation intact; states "yes" to N/T but unable to state where, mild limitations in R DF and bil hip flexion AROM secondary to weakness/swelling)    Cervical / Trunk Assessment Cervical / Trunk Assessment: Normal  Communication   Communication: Expressive difficulties (increased time/effort for processing)  Cognition Arousal/Alertness: Awake/alert Behavior During Therapy: WFL for tasks assessed/performed Overall Cognitive Status: Within Functional Limits for tasks assessed       General Comments: A&O x3; unable to state month despite max cues, able to state year with choices. Able to follow 100% of simple 2-step commands with increased time for processing of task         Exercises Other Exercises Other Exercises: Pt able to participate in bed mobility, transfers, and gait with RW. Required increased assist and multimodal cues for safety and sequencing throughout. Further mobility limited secondary to weakness and fatigue. Other Exercises: Pt and family educated regarding: PT role/POC, DC recommendations, safety with mobility, level of care pt currently requires, benefit of participation/OOB mobility, hospital bed features. They  verbalized understanding.   Assessment/Plan  PT Assessment Patient needs continued PT services  PT Problem List Decreased strength;Decreased activity tolerance;Decreased balance;Decreased mobility;Decreased coordination;Decreased cognition;Decreased safety awareness;Impaired tone       PT Treatment Interventions Gait training;Functional mobility training;Therapeutic activities;Therapeutic exercise;Balance training;Neuromuscular re-education    PT Goals (Current goals can be found in the Care Plan section)  Acute Rehab PT Goals Patient Stated Goal: "to go home" PT Goal Formulation: With patient/family Time For Goal Achievement: 12/10/20 Potential to Achieve Goals: Fair    Frequency 7X/week   Barriers to discharge Inaccessible home environment unable to get to 2nd level of home due to flight of stairs; can only sponge bathe in downstairs 1/2 bath       AM-PAC PT "6 Clicks" Mobility  Outcome Measure Help needed turning from your back to your side while in a flat bed without using bedrails?: A Lot Help needed moving from lying on your back to sitting on the side of a flat bed without using bedrails?: A Lot Help needed moving to and from a bed to a chair (including a wheelchair)?: A Lot Help needed standing up from a chair using your arms (e.g., wheelchair or bedside chair)?: A Lot Help needed to walk in hospital room?: A Lot Help needed climbing 3-5 steps with a railing? : Total 6 Click Score: 11    End of Session Equipment Utilized During Treatment: Gait belt Activity Tolerance: Patient tolerated treatment well;Patient limited by fatigue Patient left: in bed;with call bell/phone within reach;with bed alarm set;with family/visitor present (bed in chair position; BLE elevated) Nurse Communication: Mobility status PT Visit Diagnosis: Unsteadiness on feet (R26.81);Muscle weakness (generalized) (M62.81);Difficulty in walking, not elsewhere classified (R26.2);Hemiplegia and  hemiparesis Hemiplegia - Right/Left: Right    Time: 3845-3646 PT Time Calculation (min) (ACUTE ONLY): 44 min   Charges:   PT Evaluation $PT Eval Moderate Complexity: 1 Mod PT Treatments $Gait Training: 8-22 mins       Herminio Commons, PT, DPT 10:30 AM,11/26/20

## 2020-11-26 NOTE — Progress Notes (Signed)
Patient with loss of IV access.  IV team consulted and recommended PICC if patient was to receive IV medications or fluids.  MD aware and IV fluids and IV thiamine discontinued.  Will continue to monitor.

## 2020-11-26 NOTE — Progress Notes (Signed)
ID Husband and neighbor at bedside. Patient gave permission to discuss her medical condition in front of them  Patient is doing better More alert Oriented in place and person and also in the year and month Knows the president's name Wanting to go home Drinking fluids  BP (!) 150/80 (BP Location: Right Arm)   Pulse 78   Temp (!) 100.9 F (38.3 C)   Resp 18   Ht 6' (1.829 m)   Wt 83 kg   SpO2 100%   BMI 24.82 kg/m    Chest CTA Hss1s2 And soft NS- slow speech Moves all limbs  Labs CBC Latest Ref Rng & Units 11/26/2020 11/25/2020 11/24/2020  WBC 4.0 - 10.5 K/uL 7.2 8.7 10.0  Hemoglobin 12.0 - 15.0 g/dL 7.8(L) 9.3(L) 9.7(L)  Hematocrit 36.0 - 46.0 % 24.4(L) 28.4(L) 30.4(L)  Platelets 150 - 400 K/uL 75(L) 65(L) 59(L)     CMP Latest Ref Rng & Units 11/26/2020 11/24/2020 11/23/2020  Glucose 70 - 99 mg/dL 68(L) 91 206(H)  BUN 6 - 20 mg/dL 9 8 9   Creatinine 0.44 - 1.00 mg/dL 0.89 0.56 0.51  Sodium 135 - 145 mmol/L 135 135 136  Potassium 3.5 - 5.1 mmol/L 3.7 4.7 4.4  Chloride 98 - 111 mmol/L 107 106 101  CO2 22 - 32 mmol/L 24 26 29   Calcium 8.9 - 10.3 mg/dL 7.5(L) 7.9(L) 8.1(L)  Total Protein 6.5 - 8.1 g/dL - 6.1(L) 6.3(L)  Total Bilirubin 0.3 - 1.2 mg/dL - 0.7 0.9  Alkaline Phos 38 - 126 U/L - 58 58  AST 15 - 41 U/L - 23 28  ALT 0 - 44 U/L - <5 5    Micro 11/19/2020 blood culture no growth 11/20/2020 CSF culture no growth HSV PCR in CSF nonreactive VDRL nonreactive Crypto antigen nonreactive 11/12/2020 meningitis encephalitis panel nonreactive.  Impression/recommendation Encephalopathy: Encephalitis and meningitis ruled out Likely due to the pancreatitis Also received high-dose thiamine which is improving her condition.  Poor p.o. intake and weight loss for the past month.  Pancytopenia on admission.  WBC has recovered.  Hemoglobin and platelet low. No evidence of TTP or H LH  Pancreatitis incidental finding on CT abdomen lipase high patient now complains of abdominal  pain but is improving.  Unclear etiology  History of lymphoma in remission.   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  Treated with 6 weeks of IV antibiotics.  Has recovered significantly.  Residual right-sided weakness.  Discussed the management with the patient and her husband and the hospitalist  ID will sign off call if needed.

## 2020-11-26 NOTE — Progress Notes (Signed)
PROGRESS NOTE    Tonya Myers  HYW:[id number] DOB: [birth year] DOA: 11/19/2020 PCP: Tonya Pink, MD   Brief Narrative:  55 y.o. female with medical history significant for recent pneumococcal meningitis and bacteremia, depression, hypothyroid, IDDM2, presents to the ED for chief concern of altered mentation.   At bedside, she had expressive aphasia, right upper and lower extremity weakness. Confusion today per spouse. Can walk with a walker and assistance. Jumbled words. She has baseline weakness in the right arm compared to left arm.    Spouse reports that patient had poor PO intake for more than a week. Spouse endorses vomiting and dnies blood and black coffee ground substances.   Patient has been unable to tolerate the MRI due to severe claustrophobia.  Will attempt again with help of IV Ativan.  Lumbar puncture was performed, initial results not terribly impressive.  Not overtly concerning for meningitis.  Remains on broad-spectrum IV antibiotics and IV acyclovir.  Infectious disease consulted.  Concern for possible manifestation of underlying hematologic malignancy.  I have requested consultation from oncology.  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  She sees oncology only once yearly.  Patient was evaluated by oncology.  Very unlikely that current presentation is a manifestation of underlying CLL.  No clear infectious signs.  All cultures remain negative.  LP is inconsistent with meningitis.  CT head chest abdomen pelvis significant surprisingly for pancreatitis.  Lipase is elevated.  Patient has been responding to treatment for pancreatitis including bowel rest and IV fluids.  Oral thrush is responding to treatment.  Mental status remains poor.  Appetite remains poor.  When inquired about why she is not eating she denies throat or abdominal pain and states just poor appetite.  8/2: Patient remains altered and encephalopathic.  Etiology at this point has  been unclear.  I requested consultation from neurology.  Possible vitamin deficiency.  Recommending high-dose IV thiamine.     Assessment & Plan:   Principal Problem:   Subacute delirium Active Problems:   DDD (degenerative disc disease), cervical   Aphasia   Anemia of chronic disease   Acute focal neurological deficit   Encephalitis   Altered mental status   Encephalopathy acute  Acute pancreatitis Unclear etiology No reported history of alcohol intake, no radiographic evidence of gallstones Possible drug-induced however unclear exactly which drug could have driven this Initial abdominal examination negative for tenderness On interview patient does endorse left upper quadrant tenderness Lipase downtrending Triglycerides negative Plan: --d/c IVF today, encourage oral hydration  Acute toxic metabolic encephalopathy Unclear etiology --meningitis ruled out by LP.  CT head neg for new stroke.  Hematology ruled out CLL as the culprit.  No seizure on EEG. --Unfortunately patient unable to tolerate MRI --mental status dramatically improved with treatment of pancreatitis and IV thiamine repletion. Plan: --no need for MRI brain currently since pt has improved --cont thiamine as oral  Pancytopenia History of CLL All 3 cell lines acutely depressed Oncology consulted Patient's CLL has been in remission since 2016 Recurrence of hematologic note malignancy felt unlikely  Marked hypokalemia Suspect secondary to vomiting and poor p.o. intake --monitor and replete PRN  Oral thrush Patient with clinical indications of thrush Associated with difficulty swallowing Seems to be improving Patient states poor p.o. intake is driven by appetite rather than pain Plan: Continue nystatin swish and swallow, --cont Diflucan for 3 doses  Insulin-dependent diabetes mellitus Hypoglycemic episodes Patient is on long-acting formulation 50 units daily at  home in addition to metformin --A1c  6.5  Plan: --hold Lantus 25u daily due to hypoglycemia --SSI  Hyperlipidemia --cont statin  Hypothyroid PTA Synthroid  HTN --cont cardizem and hydralazine   DVT prophylaxis: TED hose Code Status: Full code Family Communication: husband updated at bedside today Disposition Plan: Status is: Inpatient  Remains inpatient appropriate because:Inpatient level of care appropriate due to severity of illness  Dispo: The patient is from: Home              Anticipated d/c is to: Home  PT/OT rec [name], pt wants to go home.              Patient currently is not medically stable to d/c.   Difficult to place patient No   Level of care: Med-Surg  Consultants:  ID Oncology Neurology  Procedures:  Lumbar puncture 7/28  Antimicrobials:    Subjective: Pt's mental status dramatically improved today, alert, oriented and could even remember her home insulin dosage.  Good oral hydration with encouragement.  Pt said she wanted to go home multiple times.  Throat pain improved.   Objective: Vitals:   11/26/20 0403 11/26/20 0741 11/26/20 1137 11/26/20 1550  BP: (!) 141/79 (!) 148/83 (!) 155/89 (!) 169/80  Pulse: 97 87 98 86  Resp: 16 16 16 16   Temp: 99.5 F (37.5 C) 98.6 F (37 C) 98.4 F (36.9 C) 98.6 F (37 C)  TempSrc: Oral     SpO2: 97% 97% 99% 95%  Weight:      Height:        Intake/Output Summary (Last 24 hours) at 11/26/2020 1632 Last data filed at 11/26/2020 1028 Gross per 24 hour  Intake 3491.34 ml  Output 525 ml  Net 2966.34 ml   Filed Weights   11/20/20 0507  Weight: 83 kg    Examination:  Constitutional: NAD, alert, oriented, coherent, responding to questions appropriately. HEENT: conjunctivae and lids normal, EOMI CV: No cyanosis.   RESP: normal respiratory effort, on RA Extremities: No effusions, edema in BLE SKIN: warm, dry Neuro: II - XII grossly intact.   Psych: Normal mood and affect.  Appropriate judgement and reason   Data Reviewed: I have  personally reviewed following labs and imaging studies  CBC: Recent Labs  Lab 11/22/20 0650 11/23/20 0556 11/24/20 0440 11/25/20 0611 11/26/20 0451  WBC 8.0 10.9* 10.0 8.7 7.2  NEUTROABS 6.2 8.6* 7.9* 7.3 5.8  HGB 8.7* 9.4* 9.7* 9.3* 7.8*  HCT 26.8* 29.4* 30.4* 28.4* 24.4*  MCV 82.2 82.4 81.3 83.0 83.3  PLT 75* 67* 59* 65* 75*   Basic Metabolic Panel: Recent Labs  Lab 11/20/20 0602 11/20/20 0737 11/21/20 0525 11/22/20 0650 11/23/20 0556 11/24/20 0440 11/26/20 0451  NA  --    < > 140 136 136 135 135  K  --    < > 2.5* 2.9* 4.4 4.7 3.7  CL  --    < > 107 103 101 106 107  CO2  --    < > 28 28 29 26 24   GLUCOSE  --    < > 229* 252* 206* 91 68*  BUN  --    < > 8 8 9 8 9   CREATININE  --    < > 0.50 0.49 0.51 0.56 0.89  CALCIUM  --    < > 8.1* 7.9* 8.1* 7.9* 7.5*  MG 1.6*  --   --   --   --   --  1.5*  PHOS  --   --   --   --   --   --  3.2   < > = values in this interval not displayed.   GFR: Estimated Creatinine Clearance: 81.5 mL/min (by C-G formula based on SCr of 0.89 mg/dL). Liver Function Tests: Recent Labs  Lab 11/20/20 0737 11/21/20 0525 11/22/20 0650 11/23/20 0556 11/24/20 0440  AST 42* 31 33 28 23  ALT <5 <5 <5 5 <5  ALKPHOS 59 56 52 58 58  BILITOT 0.9 0.5 0.7 0.9 0.7  PROT 6.4* 6.8 6.3* 6.3* 6.1*  ALBUMIN 2.3* 2.4* 2.5* 2.5* 2.5*   Recent Labs  Lab 11/21/20 1800 11/23/20 0556 11/24/20 0440 11/25/20 0611 11/26/20 0451  LIPASE 633* 541* 305* 325* 372*   Recent Labs  Lab 11/20/20 0545  AMMONIA 17   Coagulation Profile: Recent Labs  Lab 11/20/20 0602  INR 1.0   Cardiac Enzymes: No results for input(s): CKTOTAL, CKMB, CKMBINDEX, TROPONINI in the last 168 hours. BNP (last 3 results) No results for input(s): PROBNP in the last 8760 hours. HbA1C: No results for input(s): HGBA1C in the last 72 hours. CBG: Recent Labs  Lab 11/25/20 2325 11/26/20 0405 11/26/20 0739 11/26/20 1136 11/26/20 1539  GLUCAP 88 86 68* 67* 79   Lipid  Profile: No results for input(s): CHOL, HDL, LDLCALC, TRIG, CHOLHDL, LDLDIRECT in the last 72 hours.  Thyroid Function Tests: Recent Labs    11/25/20 1027  TSH 3.030  T4TOTAL 7.3    Anemia Panel: No results for input(s): VITAMINB12, FOLATE, FERRITIN, TIBC, IRON, RETICCTPCT in the last 72 hours.  Sepsis Labs: Recent Labs  Lab 11/19/20 1648 11/19/20 1846 11/20/20 0737  PROCALCITON  --   --  0.41  LATICACIDVEN 2.0* 1.9  --     Recent Results (from the past 240 hour(s))  Blood culture (routine x 2)     Status: None   Collection Time: 11/19/20  4:47 PM   Specimen: BLOOD  Result Value Ref Range Status   Specimen Description BLOOD RIGHT ANTECUBITAL  Final   Special Requests   Final    BOTTLES DRAWN AEROBIC AND ANAEROBIC Blood Culture adequate volume   Culture   Final    NO GROWTH 5 DAYS Performed at [facility], [address] [place], [place] [address]    Report Status 11/24/2020 FINAL  Final  Resp Panel by RT-PCR (Flu A&B, Covid) Nasopharyngeal Swab     Status: None   Collection Time: 11/19/20  4:48 PM   Specimen: Nasopharyngeal Swab; Nasopharyngeal(NP) swabs in vial transport medium  Result Value Ref Range Status   SARS Coronavirus 2 by RT PCR NEGATIVE NEGATIVE Final    Comment: (NOTE) SARS-CoV-2 target nucleic acids are NOT DETECTED.  The SARS-CoV-2 RNA is generally detectable in upper respiratory specimens during the acute phase of infection. The lowest concentration of SARS-CoV-2 viral copies this assay can detect is 138 copies/mL. A negative result does not preclude SARS-Cov-2 infection and should not be used as the sole basis for treatment or other patient management decisions. A negative result may occur with  improper specimen collection/handling, submission of specimen other than nasopharyngeal swab, presence of viral mutation(s) within the areas targeted by this assay, and inadequate number of viral copies(<138 copies/mL). A negative result must  be combined with clinical observations, patient history, and epidemiological information. The expected result is Negative.  Fact Sheet for Patients:  EntrepreneurPulse.com.au  Fact Sheet for Healthcare Providers:  IncredibleEmployment.be  This test is no t yet approved or cleared by the [place] FDA and  has been authorized for detection and/or diagnosis  of SARS-CoV-2 by FDA under an Emergency Use Authorization (EUA). This EUA will remain  in effect (meaning this test can be used) for the duration of the COVID-19 declaration under Section 564(b)(1) of the Act, 21 U.S.C.section 360bbb-3(b)(1), unless the authorization is terminated  or revoked sooner.       Influenza A by PCR NEGATIVE NEGATIVE Final   Influenza B by PCR NEGATIVE NEGATIVE Final    Comment: (NOTE) The Xpert Xpress SARS-CoV-2/FLU/RSV plus assay is intended as an aid in the diagnosis of influenza from Nasopharyngeal swab specimens and should not be used as a sole basis for treatment. Nasal washings and aspirates are unacceptable for Xpert Xpress SARS-CoV-2/FLU/RSV testing.  Fact Sheet for Patients: EntrepreneurPulse.com.au  Fact Sheet for Healthcare Providers: IncredibleEmployment.be  This test is not yet approved or cleared by the [place] FDA and has been authorized for detection and/or diagnosis of SARS-CoV-2 by FDA under an Emergency Use Authorization (EUA). This EUA will remain in effect (meaning this test can be used) for the duration of the COVID-19 declaration under Section 564(b)(1) of the Act, 21 U.S.C. section 360bbb-3(b)(1), unless the authorization is terminated or revoked.  Performed at [name], [address] [place], [place] [address]   Blood culture (routine x 2)     Status: None   Collection Time: 11/19/20  6:46 PM   Specimen: BLOOD  Result Value Ref Range Status   Specimen Description BLOOD  RIGHT ANTECUBITAL  Final   Special Requests   Final    BOTTLES DRAWN AEROBIC AND ANAEROBIC Blood Culture adequate volume   Culture   Final    NO GROWTH 5 DAYS Performed at [facility], [address] [place], [place] [address]    Report Status 11/24/2020 FINAL  Final  Urine Culture     Status: Abnormal   Collection Time: 11/19/20  9:33 PM   Specimen: Urine, Clean Catch  Result Value Ref Range Status   Specimen Description   Final    URINE, CLEAN CATCH Performed at [facility], [address], [place], [place] [address]    Special Requests   Final    NONE Performed at [facility], [address], [place], [place] [address]    Culture (A)  Final    <10,000 COLONIES/mL INSIGNIFICANT GROWTH Performed at [facility], [place] [address], [place], [place] [address]    Report Status 11/21/2020 FINAL  Final  Culture, fungus without smear     Status: None (Preliminary result)   Collection Time: 11/20/20 11:09 AM   Specimen: CSF; Cerebrospinal Fluid  Result Value Ref Range Status   Specimen Description   Final    CSF Performed at [facility], [address] [place], [place] [address]    Special Requests   Final    NONE Performed at [facility], [address] [place], [place] [address]    Culture   Final    NO FUNGUS ISOLATED AFTER 6 DAYS Performed at [facility], [place] [address], [place],  [address]    Report Status PENDING  Incomplete  CSF culture w Gram Stain     Status: None   Collection Time: 11/20/20 11:09 AM   Specimen: PATH Cytology CSF; Cerebrospinal Fluid  Result Value Ref Range Status   Specimen Description   Final    CSF Performed at [facility], [address] [place],  [address]    Special Requests   Final  NONE Performed at [facility], [address] [place], [place] [address]    Gram Stain   Final    NO ORGANISMS SEEN WBC SEEN RED BLOOD CELLS  PRESENT Performed at [facility], [address], [place], [place] [address]    Culture   Final    NO GROWTH 3 DAYS Performed at [facility], [place] [address] [place], [place] [address]    Report Status 11/23/2020 FINAL  Final  Fungus Culture With Stain     Status: None (Preliminary result)   Collection Time: 11/20/20 11:09 AM   Specimen: PATH Cytology CSF; Cerebrospinal Fluid  Result Value Ref Range Status   Fungus Stain Final report  Final    Comment: (NOTE) Performed At: [facility] [place], [place] [id number] Rush Farmer MD [id number]    Fungus (Mycology) Culture PENDING  Incomplete   Fungal Source CSF  Final    Comment: Performed at [facility], [address] [place], [place] [address]  Fungus Culture Result     Status: None   Collection Time: 11/20/20 11:09 AM  Result Value Ref Range Status   Result 1 Comment  Final    Comment: (NOTE) KOH/Calcofluor preparation:  no fungus observed. Performed At: [facility] [place], [place] [id number] Rush Farmer MD [id number]          Radiology Studies: EEG adult  Result Date: 12-07-2020 Lora Havens, MD     12-07-2020  5:08 PM Patient Name: Tonya Myers MRN: [id number] Epilepsy Attending: Lora Havens Referring Physician/Provider: Dr Rosalin Hawking Date: 12-07-2020 Duration: 31.44 mins Patient history: 55yo F with ams. EEG to evaluate for seizure Level of alertness: Awake AEDs during EEG study: None Technical aspects: This EEG study was done with scalp electrodes positioned according to the 10-20 International system of electrode placement. Electrical activity was acquired at a sampling rate of 500Hz  and reviewed with a high frequency filter of 70Hz  and a low frequency filter of 1Hz . EEG data were recorded continuously and digitally stored. Description: No posterior dominant rhythm was seen. EEG showed continuous generalized 3 to 6 Hz  theta-delta slowing. Physiologic photic driving was not seen during photic stimulation.  Hyperventilation was not performed.   ABNORMALITY - Continuous slow, generalized IMPRESSION: This study is suggestive of moderate diffuse encephalopathy, nonspecific etiology. No seizures or epileptiform discharges were seen throughout the recording. Priyanka [name]        Scheduled Meds:  acetaminophen  1,000 mg Oral Once   chlorhexidine  15 mL Mouth Rinse BID   diltiazem  120 mg Oral Daily   feeding supplement  1 Container Oral TID BM   fluconazole  100 mg Oral Daily   hydrALAZINE  50 mg Oral Q8H   insulin aspart  0-15 Units Subcutaneous Q4H   levothyroxine  100 mcg Oral QAC breakfast   magic mouthwash w/lidocaine  15 mL Oral TID   magnesium oxide  400 mg Oral BID   mouth rinse  15 mL Mouth Rinse q12n4p   mirtazapine  15 mg Oral QHS   multivitamin with minerals  1 tablet Oral Daily   nystatin  5 mL Oral QID   pantoprazole  40 mg Oral Daily   polyethylene glycol  34 g Oral BID   rosuvastatin  10 mg Oral QHS   thiamine  500 mg Oral TID   Continuous Infusions:     LOS: 6 days     Otila Kluver  Billie Ruddy, MD [facility] Pager [phone]  If 7PM-7AM, please contact night-coverage 11/26/2020, 4:32 PM

## 2020-11-27 ENCOUNTER — Inpatient Hospital Stay: Payer: Self-pay

## 2020-11-27 ENCOUNTER — Inpatient Hospital Stay: Payer: Medicare Other

## 2020-11-27 DIAGNOSIS — R509 Fever, unspecified: Secondary | ICD-10-CM

## 2020-11-27 DIAGNOSIS — K859 Acute pancreatitis without necrosis or infection, unspecified: Secondary | ICD-10-CM | POA: Diagnosis not present

## 2020-11-27 DIAGNOSIS — R4701 Aphasia: Secondary | ICD-10-CM | POA: Diagnosis not present

## 2020-11-27 DIAGNOSIS — G9340 Encephalopathy, unspecified: Secondary | ICD-10-CM | POA: Diagnosis not present

## 2020-11-27 LAB — GLUCOSE, CAPILLARY
Glucose-Capillary: 106 mg/dL — ABNORMAL HIGH (ref 70–99)
Glucose-Capillary: 108 mg/dL — ABNORMAL HIGH (ref 70–99)
Glucose-Capillary: 124 mg/dL — ABNORMAL HIGH (ref 70–99)
Glucose-Capillary: 176 mg/dL — ABNORMAL HIGH (ref 70–99)
Glucose-Capillary: 196 mg/dL — ABNORMAL HIGH (ref 70–99)
Glucose-Capillary: 303 mg/dL — ABNORMAL HIGH (ref 70–99)
Glucose-Capillary: 81 mg/dL (ref 70–99)

## 2020-11-27 LAB — URINALYSIS, COMPLETE (UACMP) WITH MICROSCOPIC
Bilirubin Urine: NEGATIVE
Glucose, UA: NEGATIVE mg/dL
Ketones, ur: NEGATIVE mg/dL
Leukocytes,Ua: NEGATIVE
Nitrite: NEGATIVE
Protein, ur: 100 mg/dL — AB
RBC / HPF: >50 RBC/hpf — ABNORMAL HIGH (ref 0–5)
Specific Gravity, Urine: 1.015 (ref 1.005–1.030)
Squamous Epithelial / HPF: NONE SEEN (ref 0–5)
pH: 7 (ref 5.0–8.0)

## 2020-11-27 LAB — BASIC METABOLIC PANEL
Anion gap: 6 (ref 5–15)
BUN: 8 mg/dL (ref 6–20)
CO2: 24 mmol/L (ref 22–32)
Calcium: 7.8 mg/dL — ABNORMAL LOW (ref 8.9–10.3)
Chloride: 106 mmol/L (ref 98–111)
Creatinine, Ser: 1 mg/dL (ref 0.44–1.00)
GFR, Estimated: >60 mL/min (ref >60)
Glucose, Bld: 84 mg/dL (ref 70–99)
Potassium: 3.5 mmol/L (ref 3.5–5.1)
Sodium: 136 mmol/L (ref 135–145)

## 2020-11-27 LAB — RESP PANEL BY RT-PCR (FLU A&B, COVID) ARPGX2
Influenza A by PCR: NEGATIVE
Influenza B by PCR: NEGATIVE
SARS Coronavirus 2 by RT PCR: NEGATIVE

## 2020-11-27 LAB — QUANTIFERON-TB GOLD PLUS (RQFGPL)
QuantiFERON Mitogen Value: 0.03 IU/mL
QuantiFERON Nil Value: 0.01 IU/mL
QuantiFERON TB1 Ag Value: 0.02 IU/mL
QuantiFERON TB2 Ag Value: 0.01 IU/mL

## 2020-11-27 LAB — CBC
HCT: 25.8 % — ABNORMAL LOW (ref 36.0–46.0)
Hemoglobin: 8.2 g/dL — ABNORMAL LOW (ref 12.0–15.0)
MCH: 26.7 pg (ref 26.0–34.0)
MCHC: 31.8 g/dL (ref 30.0–36.0)
MCV: 84 fL (ref 80.0–100.0)
Platelets: 90 10*3/uL — ABNORMAL LOW (ref 150–400)
RBC: 3.07 MIL/uL — ABNORMAL LOW (ref 3.87–5.11)
RDW: 17.3 % — ABNORMAL HIGH (ref 11.5–15.5)
WBC: 5.8 10*3/uL (ref 4.0–10.5)
nRBC: 0 % (ref 0.0–0.2)

## 2020-11-27 LAB — QUANTIFERON-TB GOLD PLUS: QuantiFERON-TB Gold Plus: UNDETERMINED — AB

## 2020-11-27 LAB — PHOSPHORUS: Phosphorus: 3.1 mg/dL (ref 2.5–4.6)

## 2020-11-27 LAB — PROCALCITONIN: Procalcitonin: 1.79 ng/mL

## 2020-11-27 LAB — LIPASE, BLOOD: Lipase: 305 U/L — ABNORMAL HIGH (ref 11–51)

## 2020-11-27 LAB — MAGNESIUM: Magnesium: 1.5 mg/dL — ABNORMAL LOW (ref 1.7–2.4)

## 2020-11-27 IMAGING — DX DG CHEST 1V PORT
1 series · 1 of 1 positions shown · non-contrast
Comparison: And CT chest, abdomen and pelvis [DATE].
Single-view of the chest [DATE].

CLINICAL DATA: Fever and chest pain.

EXAM:
PORTABLE CHEST 1 VIEW

[chest ap]
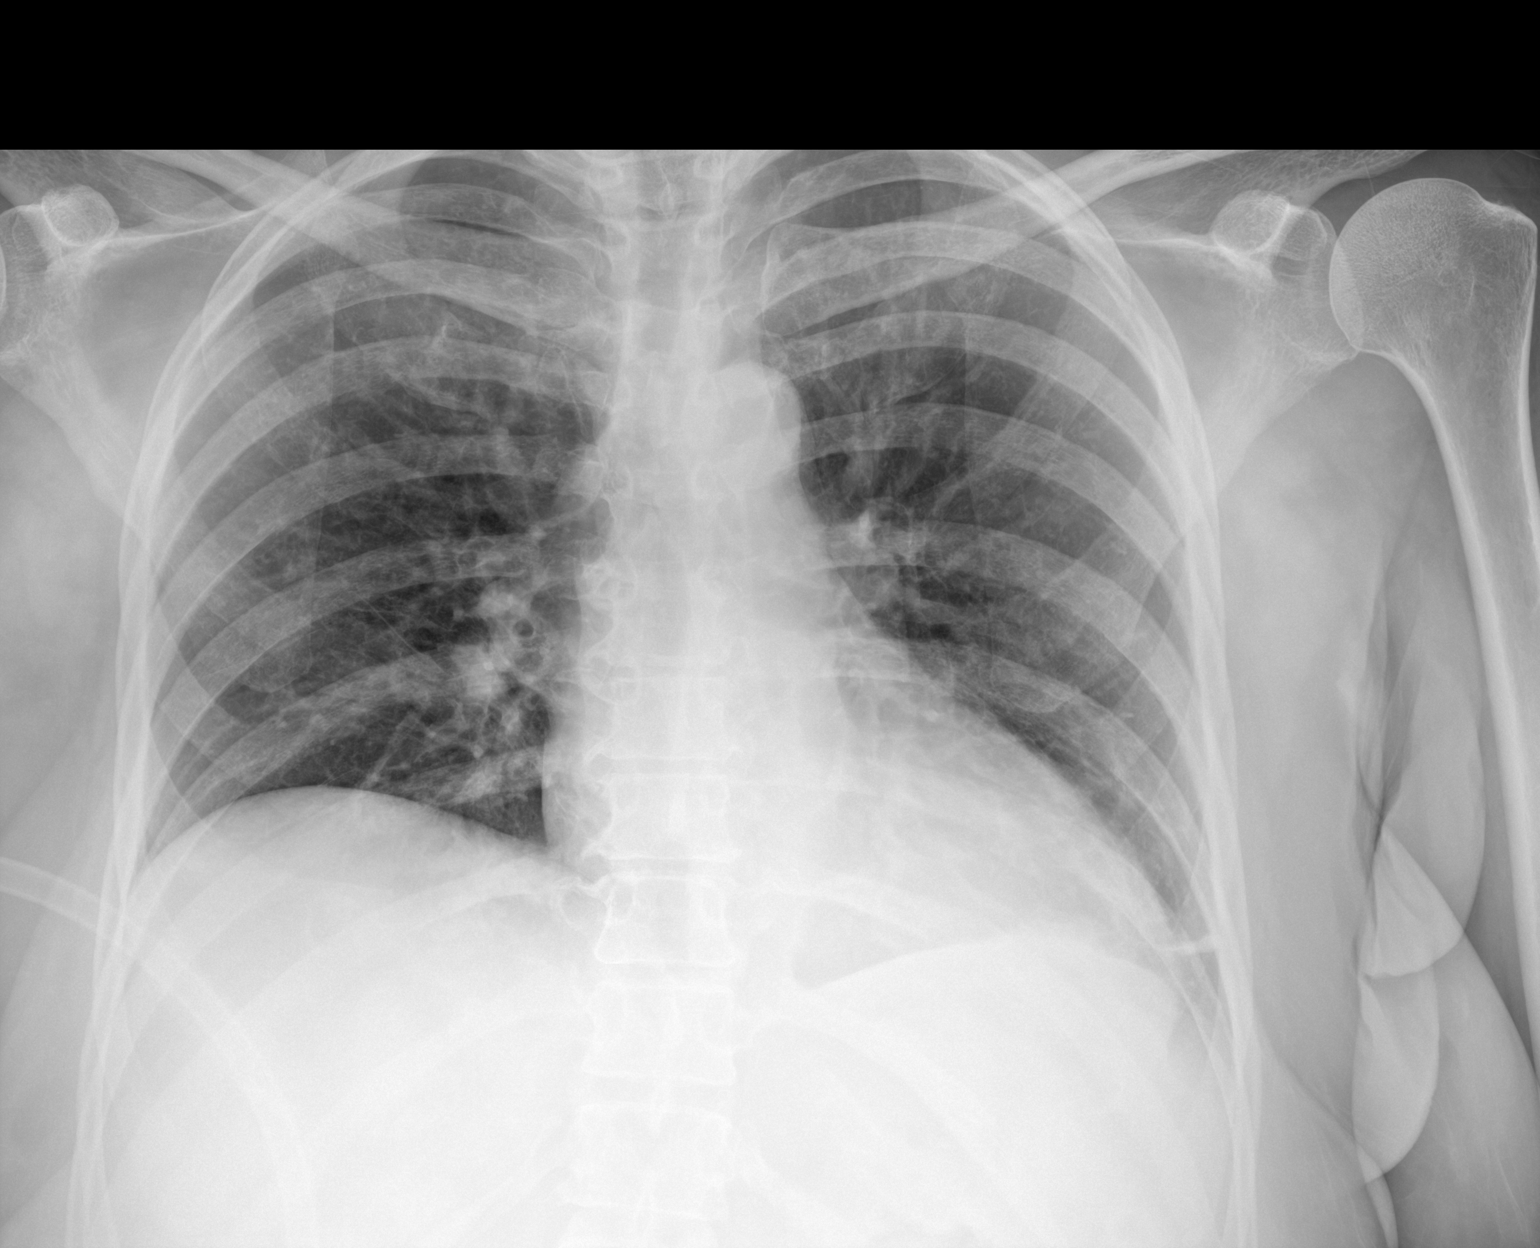

[1 of 1 positions shown; findings below may reference images not displayed]

FINDINGS: Subtle micro nodules are seen in the right upper lobe. The left lung
demonstrates minimal atelectasis in the base. Heart size is normal.
No pneumothorax or pleural fluid. No acute or focal bony
abnormality.
IMPRESSION: Subtle focus of mild micronodularity in the right upper lobe
compatible with residual infectious or inflammatory process as seen
on prior CT. Recommend continued follow-up to clearing.

## 2020-11-27 MED ORDER — HALOPERIDOL LACTATE 5 MG/ML IJ SOLN: 2.0000 mg | INTRAMUSCULAR | Status: DC | PRN

## 2020-11-27 MED ORDER — MIDAZOLAM HCL 2 MG/2ML IJ SOLN: 2.0000 mg | INTRAMUSCULAR | Status: DC | PRN

## 2020-11-27 MED ORDER — CHLORHEXIDINE GLUCONATE CLOTH 2 % EX PADS
6.0000 | MEDICATED_PAD | Freq: Every day | CUTANEOUS | Status: DC
  Administered 2020-11-27 – 2020-12-22 (×25): 6 via TOPICAL

## 2020-11-27 MED ORDER — MAGNESIUM OXIDE -MG SUPPLEMENT 400 (240 MG) MG PO TABS
800.0000 mg | ORAL_TABLET | Freq: Two times a day (BID) | ORAL | Status: DC
  Administered 2020-11-27 – 2020-12-22 (×49): 800 mg via ORAL
  Filled 2020-11-27 (×49): qty 2

## 2020-11-27 MED ORDER — SODIUM CHLORIDE 0.9% FLUSH
10.0000 mL | INTRAVENOUS | Status: DC | PRN
  Administered 2020-12-04 – 2020-12-19 (×2): 10 mL

## 2020-11-27 MED ORDER — SODIUM CHLORIDE 0.9% FLUSH
10.0000 mL | Freq: Two times a day (BID) | INTRAVENOUS | Status: DC
  Administered 2020-11-27: 10 mL
  Administered 2020-11-27: 20 mL
  Administered 2020-11-28 – 2020-11-30 (×4): 10 mL
  Administered 2020-12-01: 09:00:00 20 mL
  Administered 2020-12-01 – 2020-12-02 (×2): 10 mL
  Administered 2020-12-02: 20 mL
  Administered 2020-12-03 – 2020-12-22 (×38): 10 mL

## 2020-11-27 MED ORDER — LORAZEPAM 2 MG/ML IJ SOLN
2.0000 mg | INTRAMUSCULAR | Status: AC | PRN
  Administered 2020-11-28 (×2): 2 mg via INTRAVENOUS
  Filled 2020-11-27 (×2): qty 1

## 2020-11-27 MED ORDER — THIAMINE HCL 100 MG PO TABS
100.0000 mg | ORAL_TABLET | Freq: Every day | ORAL | Status: DC
  Administered 2020-11-29 – 2020-12-22 (×24): 100 mg via ORAL
  Filled 2020-11-27 (×24): qty 1

## 2020-11-27 NOTE — TOC Progression Note (Signed)
Transition of Care [facility]) - Progression Note    Patient Details  Name: Tonya Myers MRN: [id number] Date of Birth: 21-Oct-1965  Transition of Care [facility]) [name] Contact  Shelbie Hutching, RN Phone Number: 11/27/2020, 11:49 AM  Clinical Narrative:    Patient spiked a fever of 103 this morning.  PICC line placed and further workup required.   [name] will send out a bed extender for her hospital bed at home.  Husband reported that the bed was not long enough, patient is 6'.     Expected Discharge Plan: [place] Barriers to Discharge: Continued Medical Work up  Expected Discharge Plan and Services Expected Discharge Plan: [place]   Discharge Planning Services: CM Consult Post Acute Care Choice: [place], [name] Living arrangements for the past 2 months: Single Family Home                 DME Arranged: N/A DME Agency: NA       HH Arranged: PT, OT [place] Agency: [place] ([facility]) Date HH Agency Contacted: 11/24/20 Time [place]: 1419 Representative spoke with at [place]: [place] ([place]) Interventions    Readmission Risk Interventions Readmission Risk Prevention Plan 11/24/2020  Transportation Screening Complete  PCP or Specialist Appt within 3-5 Days Complete  HRI or [place] Complete  Social Work Consult for [place] Planning/Counseling Complete  Palliative Care Screening Not Applicable  Medication Review Press photographer) Complete  Some recent data might be hidden

## 2020-11-27 NOTE — Progress Notes (Signed)
   11/26/20 2353  Assess: MEWS Score  Temp (!) 103 F (39.4 C)  BP 135/81  Pulse Rate (!) 110  Resp 18  SpO2 98 %  O2 Device Room Air  Assess: MEWS Score  MEWS Temp 2  MEWS Systolic 0  MEWS Pulse 1  MEWS RR 0  MEWS LOC 0  MEWS Score 3  MEWS Score Color Yellow  Assess: if the MEWS score is Yellow or Red  Were vital signs taken at a resting state? Yes  Focused Assessment No change from prior assessment  Does the patient meet 2 or more of the SIRS criteria? Yes  Does the patient have a confirmed or suspected source of infection? No  Provider and Rapid Response Notified? No  MEWS guidelines implemented *See Row Information* Yes  Treat  MEWS Interventions Administered prn meds/treatments  Pain Scale 0-10  Pain Score 0  Take Vital Signs  Increase Vital Sign Frequency  Yellow: Q 2hr X 2 then Q 4hr X 2, if remains yellow, continue Q 4hrs  Escalate  MEWS: Escalate Yellow: discuss with charge nurse/RN and consider discussing with provider and RRT  Notify: Charge Nurse/RN  Name of Charge Nurse/RN Notified Orvil Feil, RN  Date Charge Nurse/RN Notified 11/27/20  Time Charge Nurse/RN Notified 2353  Document  Patient Outcome Other (Comment) (PRN Tylenol given at this time)  Progress note created (see row info) Yes  Assess: SIRS CRITERIA  SIRS Temperature  1  SIRS Pulse 1  SIRS Respirations  0  SIRS WBC 0  SIRS Score Sum  2   Patient temperature 102.8, HR 110, no complaints of pain, alert, tylenol given for temperature, will continue to monitor.

## 2020-11-27 NOTE — Progress Notes (Addendum)
Physical Therapy Treatment Patient Details Name: Tonya Myers MRN: [id number] DOB: 1965-08-15 Today's Date: 11/27/2020    History of Present Illness Pt admitted to [facility] on 11/19/20 for decreased appetite, vomiting, AMS, and lethargy. On EMS assessment, pt was found to have fever 102 and AMS with confusion and left sided weakness.  Found to have pancytopenia, oncology was consulted due to hx of CLL, but so far no concern of CLL relapse. ID also on board, initially concerning for meningitis due to hx of pneumococcal meningitis. PMH significant for: DDD, cervical radiculopathy, anxiety, DM, HTN, hypothyroidism, CLL, asthma, cLBP, vertigo, and hx of stroke.    PT Comments    Patient agreed to participate with PT with encouragement. Pt's spouse present thorough out the session. Pt needs Max assist to perform Sit to supine and to maintain standing balance using FWW 2/2 generalized weakness and Hemiparesis. Pt needs Mod VCs for Proper technique to perform AROM appropriately. Pt will benefit from continued therapy to strengthen, improve balance and improve gait. Therefore, based on today's findings, I recommend pt to Acute rehab to help pt and spouse to return to PLOF functions. ( [name] because pt requested and has a positive prior experience there.) Care team communicated with regarding the D/C recommendations.   Follow Up Recommendations  [name]     Equipment Recommendations  Hospital bed (in process of getting new hospital bed.)    Recommendations for Other Services       Precautions / Restrictions      Mobility  Bed Mobility Overal bed mobility: Needs Assistance Bed Mobility: Supine to Sit;Sit to Supine     Supine to sit: Mod assist Sit to supine: Max assist (to BLE)   General bed mobility comments: MIN-MOD A for BLE management during transfer. Requires verbal cues to sequence    Transfers Overall transfer level: Needs assistance Equipment used: Rolling walker (2 wheeled)   Sit to  Stand: Max assist            Ambulation/Gait                 Stairs             Wheelchair Mobility    Modified Rankin (Stroke Patients Only)       Balance     Sitting balance-Leahy Scale: Good     Standing balance support: Bilateral upper extremity supported;During functional activity Standing balance-Leahy Scale: Zero Standing balance comment: leans forward and to left requiring max assist to maintain standing balance.                            Cognition Arousal/Alertness: Awake/alert Behavior During Therapy: WFL for tasks assessed/performed Overall Cognitive Status: Impaired/Different from baseline                                        Exercises General Exercises - Lower Extremity Ankle Circles/Pumps: AROM;20 reps Long Arc Quad: AROM;20 reps Hip Flexion/Marching: AROM;20 reps    General Comments        Pertinent Vitals/Pain      Home Living                      Prior Function            PT Goals (current goals can now be found in the care plan section) Acute  Rehab PT Goals PT Goal Formulation: With patient/family Time For Goal Achievement: 12/10/20 Potential to Achieve Goals: Fair Progress towards PT goals: Progressing toward goals    Frequency    7X/week      PT Plan Current plan remains appropriate    Co-evaluation              AM-PAC PT "6 Clicks" Mobility   Outcome Measure  Help needed turning from your back to your side while in a flat bed without using bedrails?: A Little Help needed moving from lying on your back to sitting on the side of a flat bed without using bedrails?: A Lot Help needed moving to and from a bed to a chair (including a wheelchair)?: A Lot Help needed standing up from a chair using your arms (e.g., wheelchair or bedside chair)?: A Lot Help needed to walk in hospital room?: Total Help needed climbing 3-5 steps with a railing? : Total 6 Click Score:  11    End of Session Equipment Utilized During Treatment: Gait belt Activity Tolerance: Patient limited by fatigue;Patient tolerated treatment well Patient left: in bed;with bed alarm set;with family/visitor present   PT Visit Diagnosis: Unsteadiness on feet (R26.81);Muscle weakness (generalized) (M62.81);Difficulty in walking, not elsewhere classified (R26.2);Hemiplegia and hemiparesis Hemiplegia - Right/Left: Right     Time: 2248-2500 PT Time Calculation (min) (ACUTE ONLY): 26 min  Charges:  $Therapeutic Exercise: 8-22 mins $Therapeutic Activity: 8-22 mins                    Cephus Tupy PT DPT 4:30 PM,11/27/20

## 2020-11-27 NOTE — Progress Notes (Signed)
Date of Admission:  11/19/2020     ID: Tonya Myers is a 55 y.o. female  Principal Problem:   Subacute delirium Active Problems:   DDD (degenerative disc disease), cervical   Aphasia   Anemia of chronic disease   Acute focal neurological deficit   Encephalitis   Altered mental status   Encephalopathy acute    Subjective: Patient had a fever last night while asleep.  Her temperature went up to 103.  She did not feel that temperature Today she has been doing fine.  She has not had any temperature.  No cough or shortness of breath.  No dysuria.  No abdominal pain.  Medications:   acetaminophen  1,000 mg Oral Once   chlorhexidine  15 mL Mouth Rinse BID   Chlorhexidine Gluconate Cloth  6 each Topical Daily   diltiazem  120 mg Oral Daily   feeding supplement  1 Container Oral TID BM   hydrALAZINE  50 mg Oral Q8H   insulin aspart  0-15 Units Subcutaneous Q4H   levothyroxine  100 mcg Oral QAC breakfast   magic mouthwash w/lidocaine  15 mL Oral TID   magnesium oxide  800 mg Oral BID   mouth rinse  15 mL Mouth Rinse q12n4p   mirtazapine  15 mg Oral QHS   multivitamin with minerals  1 tablet Oral Daily   pantoprazole  40 mg Oral Daily   polyethylene glycol  34 g Oral BID   rosuvastatin  10 mg Oral QHS   sodium chloride flush  10-40 mL Intracatheter Q12H   [START ON 11/29/2020] thiamine  100 mg Oral Daily   thiamine  500 mg Oral TID    Objective: Awake and alert Oriented in place person and time. Vital signs in last 24 hours: Patient Vitals for the past 24 hrs:  BP Temp Temp src Pulse Resp SpO2  11/27/20 1617 113/63 98.6 F (37 C) -- 90 20 96 %  11/27/20 1114 (!) 144/79 97.8 F (36.6 C) -- 85 20 100 %  11/27/20 0720 135/72 99 F (37.2 C) -- 92 20 98 %  11/27/20 0542 (!) 143/87 -- -- 83 -- --  11/27/20 0358 (!) 141/83 98.7 F (37.1 C) Oral 100 18 98 %  11/27/20 0152 (!) 152/89 99 F (37.2 C) -- 98 18 98 %  11/26/20 2357 -- (!) 102.8 F (39.3 C) Oral (!) 107 -- 100 %   11/26/20 2353 135/81 (!) 103 F (39.4 C) Oral (!) 110 18 98 %  11/26/20 2004 (!) 150/80 (!) 100.9 F (38.3 C) -- 78 18 100 %    Temp:  [97.8 F (36.6 C)-103 F (39.4 C)] 98.6 F (37 C) (08/04 1617) Pulse Rate:  [78-110] 90 (08/04 1617) Resp:  [18-20] 20 (08/04 1617) BP: (113-152)/(63-89) 113/63 (08/04 1617) SpO2:  [96 %-100 %] 96 % (08/04 1617)  Head: Normocephalic, without obvious abnormality, atraumatic. Eyes: Conjunctivae clear, anicteric sclerae. Pupils are equal ENT Nares normal. No drainage or sinus tenderness. Lips, mucosa, and tongue normal. No Thrush Neck: Supple, symmetrical, no adenopathy, thyroid: non tender no carotid bruit and no JVD. Back: No CVA tenderness. Lungs: Bilateral air entry mild weakness right side. Heart: Regular rate and rhythm, no murmur, rub or gallop. Abdomen: Soft, non-tender,not distended. Bowel sounds normal. No masses Extremities: atraumatic, no cyanosis. No edema. No clubbing Skin: No rashes or lesions. Or bruising Lymph: Cervical, supraclavicular normal. Neurologic:.  Mild weakness right side.  Lab Results Recent Labs    11/26/20  0451 11/27/20 0458  WBC 7.2 5.8  HGB 7.8* 8.2*  HCT 24.4* 25.8*  NA 135 136  K 3.7 3.5  CL 107 106  CO2 24 24  BUN 9 8  CREATININE 0.89 1.00   Liver Panel No results for input(s): PROT, ALBUMIN, AST, ALT, ALKPHOS, BILITOT, BILIDIR, IBILI in the last 72 hours. Sedimentation Rate Recent Labs    11/25/20 1027  ESRSEDRATE 104*   C-Reactive Protein No results for input(s): CRP in the last 72 hours.  Microbiology:  Studies/Results: DG Chest Port 1 View  Result Date: 11/27/2020 CLINICAL DATA:  Fever and chest pain. EXAM: PORTABLE CHEST 1 VIEW COMPARISON:  And CT chest, abdomen and pelvis 11/21/2020. Single-view of the chest 10/01/2020. FINDINGS: Subtle micro nodules are seen in the right upper lobe. The left lung demonstrates minimal atelectasis in the base. Heart size is normal. No pneumothorax or  pleural fluid. No acute or focal bony abnormality. IMPRESSION: Subtle focus of mild micronodularity in the right upper lobe compatible with residual infectious or inflammatory process as seen on prior CT. Recommend continued follow-up to clearing. Electronically Signed   By: Inge Rise M.D.   On: 11/27/2020 11:36   [place] EKG SITE RITE  Result Date: 11/27/2020 If Site Rite image not attached, placement could not be confirmed due to current cardiac rhythm.    Assessment/Plan: Patient admitted with encephalopathy.  Unclear etiology.  Likely due to pancreatitis.  Meningitis and encephalitis ruled out by CSF PCR.  Much improved.  Pancytopenia on admission.  WBC is to good with a hemoglobin and platelet low but stable.  No evidence of TTP or H LH.  Pancreatitis incidental finding on CT abdomen.  Lipase high.  Patient had abdominal pain but it has improved.  History of lymphoma in remission.  Next New onset fever last night.  Rule out urinary tract infection. Bladder scan did not show any residual.  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  Underwent laminectomies at multiple thoracic levels also had drains placed for paraspinal abscess in the lumbar area.  She completed 6 weeks of IV ceftriaxone.  She had recovered significantly since the infection.  Discussed the management with the patient and the hospitalist.

## 2020-11-27 NOTE — Progress Notes (Signed)
PT Cancellation Note  Patient Details Name: Tonya Myers MRN: [id number] DOB: 12-19-1965   Cancelled Treatment:    Reason Eval/Treat Not Completed: Fatigue/lethargy limiting ability to participate. Pt supine in bed upon entry with spouse at bedside. States she does not have enough energy for PT this morning, and that she just had a PICC line placed. PT educated pt and spouse regarding benefits of participation with PT, especially since they have decided to [place] home with HHPT. Pt and spouse agreeable. Will follow up with therapy intervention later today, as appropriate.  Herminio Commons, PT, DPT 12:12 PM,11/27/20

## 2020-11-27 NOTE — Progress Notes (Signed)
PROGRESS NOTE    Tonya Myers  TGY:[id number] DOB: November 15, 1965 DOA: 11/19/2020 PCP: [name], MD   Brief Narrative:  55 y.o. female with medical history significant for recent pneumococcal meningitis and bacteremia, depression, hypothyroid, IDDM2, presents to the ED for chief concern of altered mentation.   At bedside, she had expressive aphasia, right upper and lower extremity weakness. Confusion today per spouse. Can walk with a walker and assistance. Jumbled words. She has baseline weakness in the right arm compared to left arm.    Spouse reports that patient had poor PO intake for more than a week. Spouse endorses vomiting and dnies blood and black coffee ground substances.   Patient has been unable to tolerate the MRI due to severe claustrophobia.  Will attempt again with help of IV Ativan.  Lumbar puncture was performed, initial results not terribly impressive.  Not overtly concerning for meningitis.  Remains on broad-spectrum IV antibiotics and IV acyclovir.  Infectious disease consulted.  Concern for possible manifestation of underlying hematologic malignancy.  I have requested consultation from oncology.  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  She sees oncology only once yearly.  Patient was evaluated by oncology.  Very unlikely that current presentation is a manifestation of underlying CLL.  No clear infectious signs.  All cultures remain negative.  LP is inconsistent with meningitis.  CT head chest abdomen pelvis significant surprisingly for pancreatitis.  Lipase is elevated.  Patient has been responding to treatment for pancreatitis including bowel rest and IV fluids.  Oral thrush is responding to treatment.  Mental status remains poor.  Appetite remains poor.  When inquired about why she is not eating she denies throat or abdominal pain and states just poor appetite.  8/2: Patient remains altered and encephalopathic.  Etiology at this point has  been unclear.  I requested consultation from neurology.  Possible vitamin deficiency.  Recommending high-dose IV thiamine.     Assessment & Plan:   Principal Problem:   Subacute delirium Active Problems:   DDD (degenerative disc disease), cervical   Aphasia   Anemia of chronic disease   Acute focal neurological deficit   Encephalitis   Altered mental status   Encephalopathy acute   Fever Hx of epidural and paraspinal abscess --pt had fever up to 103 overnight. --UA, repeat covid neg.  No cough or dyspnea to suggest PNA.  Procal increased. Plan: --MRI brain, spine w contrast to rule out infection, since pt had a prior hx. --Pt will need to be sedated prior to MRI, IV ativan 2 mg q15 min x2 doses PRN ordered, and if not sedated, then IV haldol 2 mg x 2 doses PRN.  Acute pancreatitis Unclear etiology No reported history of alcohol intake, no radiographic evidence of gallstones Possible drug-induced however unclear exactly which drug could have driven this Initial abdominal examination negative for tenderness On interview patient does endorse left upper quadrant tenderness Lipase downtrending Triglycerides negative Plan: --hold further IVF, encourage oral hydration  Acute toxic metabolic encephalopathy Unclear etiology --meningitis ruled out by LP.  CT head neg for new stroke.  Hematology ruled out CLL as the culprit.  No seizure on EEG. --Unfortunately patient unable to tolerate MRI --mental status dramatically improved with treatment of pancreatitis and IV thiamine repletion. Plan: --MRI brain today with sedation --cont thiamine as oral  Pancytopenia History of CLL All 3 cell lines acutely depressed Oncology consulted Patient's CLL has been in remission since 2016 Recurrence of hematologic  note malignancy felt unlikely  Marked hypokalemia Suspect secondary to vomiting and poor p.o. intake --monitor and replete PRN  Mouth pain and Oral thrush Patient with  clinical indications of thrush Associated with difficulty swallowing Seems to be improving Patient states poor p.o. intake is driven by appetite rather than pain Plan: Continue nystatin swish and swallow --cont Diflucan for 3 doses  Insulin-dependent diabetes mellitus Hypoglycemic episodes Patient is on long-acting formulation 50 units daily at home in addition to metformin --A1c 6.5  Plan: --hold Lantus 25u daily due to hypoglycemia --SSI  Hyperlipidemia --cont statin  Hypothyroid PTA Synthroid  HTN --cont cardizem and hydralazine   DVT prophylaxis: TED hose Code Status: Full code Family Communication: husband updated at bedside today Disposition Plan: Status is: Inpatient  Remains inpatient appropriate because:Inpatient level of care appropriate due to severity of illness  Dispo: The patient is from: Home              Anticipated d/c is to: Home  PT/OT rec [name], pt wants to go home.              Patient currently is not medically stable to d/c.   Difficult to place patient No   Level of care: Med-Surg  Consultants:  ID Oncology Neurology  Procedures:  Lumbar puncture 7/28  Antimicrobials:    Subjective: Pt had fever up to 103 overnight.    Mental status remained good today.  Pt reported mouth pain and abdominal pain both resolved.    Repeat covid, UA neg for infection.  No cough or dyspnea to suggest PNA.  MRI brain and spine ordered to rule out infection.  Pt and husband apparently changed their mind and now want to consider [name].   Objective: Vitals:   11/27/20 0542 11/27/20 0720 11/27/20 1114 11/27/20 1617  BP: (!) 143/87 135/72 (!) 144/79 113/63  Pulse: 83 92 85 90  Resp:  20 20 20   Temp:  99 F (37.2 C) 97.8 F (36.6 C) 98.6 F (37 C)  TempSrc:      SpO2:  98% 100% 96%  Weight:      Height:        Intake/Output Summary (Last 24 hours) at 11/27/2020 1732 Last data filed at 11/27/2020 1617 Gross per 24 hour  Intake --  Output 925 ml   Net -925 ml   Filed Weights   11/20/20 0507  Weight: 83 kg    Examination:  Constitutional: NAD, AAOx3 HEENT: conjunctivae and lids normal, EOMI CV: No cyanosis.   RESP: normal respiratory effort, on RA Extremities: No effusions, edema in BLE SKIN: warm, dry Neuro: II - XII grossly intact.     Data Reviewed: I have personally reviewed following labs and imaging studies  CBC: Recent Labs  Lab 11/22/20 0650 11/23/20 0556 11/24/20 0440 11/25/20 0611 11/26/20 0451 11/27/20 0458  WBC 8.0 10.9* 10.0 8.7 7.2 5.8  NEUTROABS 6.2 8.6* 7.9* 7.3 5.8  --   HGB 8.7* 9.4* 9.7* 9.3* 7.8* 8.2*  HCT 26.8* 29.4* 30.4* 28.4* 24.4* 25.8*  MCV 82.2 82.4 81.3 83.0 83.3 84.0  PLT 75* 67* 59* 65* 75* 90*   Basic Metabolic Panel: Recent Labs  Lab 11/22/20 0650 11/23/20 0556 11/24/20 0440 11/26/20 0451 11/27/20 0458  NA 136 136 135 135 136  K 2.9* 4.4 4.7 3.7 3.5  CL 103 101 106 107 106  CO2 28 29 26 24 24   GLUCOSE 252* 206* 91 68* 84  BUN 8 9 8 9  8  CREATININE 0.49 0.51 0.56 0.89 1.00  CALCIUM 7.9* 8.1* 7.9* 7.5* 7.8*  MG  --   --   --  1.5* 1.5*  PHOS  --   --   --  3.2 3.1   GFR: Estimated Creatinine Clearance: 72.5 mL/min (by C-G formula based on SCr of 1 mg/dL). Liver Function Tests: Recent Labs  Lab 11/21/20 0525 11/22/20 0650 11/23/20 0556 11/24/20 0440  AST 31 33 28 23  ALT <5 <5 5 <5  ALKPHOS 56 52 58 58  BILITOT 0.5 0.7 0.9 0.7  PROT 6.8 6.3* 6.3* 6.1*  ALBUMIN 2.4* 2.5* 2.5* 2.5*   Recent Labs  Lab 11/23/20 0556 11/24/20 0440 11/25/20 0611 11/26/20 0451 11/27/20 0458  LIPASE 541* 305* 325* 372* 305*   No results for input(s): AMMONIA in the last 168 hours.  Coagulation Profile: No results for input(s): INR, PROTIME in the last 168 hours.  Cardiac Enzymes: No results for input(s): CKTOTAL, CKMB, CKMBINDEX, TROPONINI in the last 168 hours. BNP (last 3 results) No results for input(s): PROBNP in the last 8760 hours. HbA1C: No results for  input(s): HGBA1C in the last 72 hours. CBG: Recent Labs  Lab 11/27/20 0000 11/27/20 0401 11/27/20 0721 11/27/20 1121 11/27/20 1617  GLUCAP 106* 81 108* 124* 176*   Lipid Profile: No results for input(s): CHOL, HDL, LDLCALC, TRIG, CHOLHDL, LDLDIRECT in the last 72 hours.  Thyroid Function Tests: Recent Labs    11/25/20 1027  TSH 3.030  T4TOTAL 7.3    Anemia Panel: No results for input(s): VITAMINB12, FOLATE, FERRITIN, TIBC, IRON, RETICCTPCT in the last 72 hours.  Sepsis Labs: Recent Labs  Lab 11/27/20 0458  PROCALCITON 1.79    Recent Results (from the past 240 hour(s))  Blood culture (routine x 2)     Status: None   Collection Time: 11/19/20  4:47 PM   Specimen: BLOOD  Result Value Ref Range Status   Specimen Description BLOOD RIGHT ANTECUBITAL  Final   Special Requests   Final    BOTTLES DRAWN AEROBIC AND ANAEROBIC Blood Culture adequate volume   Culture   Final    NO GROWTH 5 DAYS Performed at [facility], [address] [place], [place] [address]    Report Status 11/24/2020 FINAL  Final  Resp Panel by RT-PCR (Flu A&B, Covid) Nasopharyngeal Swab     Status: None   Collection Time: 11/19/20  4:48 PM   Specimen: Nasopharyngeal Swab; Nasopharyngeal(NP) swabs in vial transport medium  Result Value Ref Range Status   SARS Coronavirus 2 by RT PCR NEGATIVE NEGATIVE Final    Comment: (NOTE) SARS-CoV-2 target nucleic acids are NOT DETECTED.  The SARS-CoV-2 RNA is generally detectable in upper respiratory specimens during the acute phase of infection. The lowest concentration of SARS-CoV-2 viral copies this assay can detect is 138 copies/mL. A negative result does not preclude SARS-Cov-2 infection and should not be used as the sole basis for treatment or other patient management decisions. A negative result may occur with  improper specimen collection/handling, submission of specimen other than nasopharyngeal swab, presence of viral mutation(s) within  the areas targeted by this assay, and inadequate number of viral copies(<138 copies/mL). A negative result must be combined with clinical observations, patient history, and epidemiological information. The expected result is Negative.  Fact Sheet for Patients:  EntrepreneurPulse.com.au  Fact Sheet for Healthcare Providers:  IncredibleEmployment.be  This test is no t yet approved or cleared by the [place] and  has been authorized for  detection and/or diagnosis of SARS-CoV-2 by FDA under an Emergency Use Authorization (EUA). This EUA will remain  in effect (meaning this test can be used) for the duration of the COVID-19 declaration under Section 564(b)(1) of the Act, 21 U.S.C.section 360bbb-3(b)(1), unless the authorization is terminated  or revoked sooner.       Influenza A by PCR NEGATIVE NEGATIVE Final   Influenza B by PCR NEGATIVE NEGATIVE Final    Comment: (NOTE) The Xpert Xpress SARS-CoV-2/FLU/RSV plus assay is intended as an aid in the diagnosis of influenza from Nasopharyngeal swab specimens and should not be used as a sole basis for treatment. Nasal washings and aspirates are unacceptable for Xpert Xpress SARS-CoV-2/FLU/RSV testing.  Fact Sheet for Patients: EntrepreneurPulse.com.au  Fact Sheet for Healthcare Providers: IncredibleEmployment.be  This test is not yet approved or cleared by the [place] FDA and has been authorized for detection and/or diagnosis of SARS-CoV-2 by FDA under an Emergency Use Authorization (EUA). This EUA will remain in effect (meaning this test can be used) for the duration of the COVID-19 declaration under Section 564(b)(1) of the Act, 21 U.S.C. section 360bbb-3(b)(1), unless the authorization is terminated or revoked.  Performed at [facility], [address] [place], [place] [address]   Blood culture (routine x 2)     Status: None    Collection Time: 11/19/20  6:46 PM   Specimen: BLOOD  Result Value Ref Range Status   Specimen Description BLOOD RIGHT ANTECUBITAL  Final   Special Requests   Final    BOTTLES DRAWN AEROBIC AND ANAEROBIC Blood Culture adequate volume   Culture   Final    NO GROWTH 5 DAYS Performed at [facility], [address], [place], [place] [address]    Report Status 11/24/2020 FINAL  Final  Urine Culture     Status: Abnormal   Collection Time: 11/19/20  9:33 PM   Specimen: Urine, Clean Catch  Result Value Ref Range Status   Specimen Description   Final    URINE, CLEAN CATCH Performed at [facility], [address], [place], [place] [address]    Special Requests   Final    NONE Performed at [facility], [address], [place], [place] [address]    Culture (A)  Final    <10,000 COLONIES/mL INSIGNIFICANT GROWTH Performed at [facility], [place] [address] [place], [place] [address]    Report Status 11/21/2020 FINAL  Final  Culture, fungus without smear     Status: None (Preliminary result)   Collection Time: 11/20/20 11:09 AM   Specimen: CSF; Cerebrospinal Fluid  Result Value Ref Range Status   Specimen Description   Final    CSF Performed at [facility], [address] [place], [place] [address]    Special Requests   Final    NONE Performed at [facility], [address] [place], [place] [address]    Culture   Final    NO FUNGUS ISOLATED AFTER 7 DAYS Performed at [facility], [place] [address], [place], [place] [address]    Report Status PENDING  Incomplete  CSF culture w Gram Stain     Status: None   Collection Time: 11/20/20 11:09 AM   Specimen: PATH Cytology CSF; Cerebrospinal Fluid  Result Value Ref Range Status   Specimen Description   Final    CSF Performed at [facility], [address] [place], [place] [address]    Special Requests  Final    NONE Performed at [facility], [address] [place], [place] [address]    Gram Stain   Final    NO ORGANISMS SEEN WBC SEEN RED BLOOD CELLS PRESENT Performed at [facility], [address], [place], [place] [address]    Culture   Final    NO GROWTH 3 DAYS Performed at [facility], [place] [address] [place], [place] [address]    Report Status 11/23/2020 FINAL  Final  Fungus Culture With Stain     Status: None (Preliminary result)   Collection Time: 11/20/20 11:09 AM   Specimen: PATH Cytology CSF; Cerebrospinal Fluid  Result Value Ref Range Status   Fungus Stain Final report  Final    Comment: (NOTE) Performed At: [facility] [place], [place] [id number] Rush Farmer MD [id number]    Fungus (Mycology) Culture PENDING  Incomplete   Fungal Source CSF  Final    Comment: Performed at [facility], [address] [place], [place] [address]  Fungus Culture Result     Status: None   Collection Time: 11/20/20 11:09 AM  Result Value Ref Range Status   Result 1 Comment  Final    Comment: (NOTE) KOH/Calcofluor preparation:  no fungus observed. Performed At: [facility] [place], [place] [id number] Rush Farmer MD OA:[id number]   Resp Panel by RT-PCR (Flu A&B, Covid) Nasopharyngeal Swab     Status: None   Collection Time: 11/27/20  1:39 PM   Specimen: Nasopharyngeal Swab; Nasopharyngeal(NP) swabs in vial transport medium  Result Value Ref Range Status   SARS Coronavirus 2 by RT PCR NEGATIVE NEGATIVE Final    Comment: (NOTE) SARS-CoV-2 target nucleic acids are NOT DETECTED.  The SARS-CoV-2 RNA is generally detectable in upper respiratory specimens during the acute phase of infection. The lowest concentration of SARS-CoV-2 viral copies this assay can detect is 138 copies/mL. A negative result does not preclude SARS-Cov-2 infection and should not be used as the sole basis for treatment or other patient management decisions. A negative  result may occur with  improper specimen collection/handling, submission of specimen other than nasopharyngeal swab, presence of viral mutation(s) within the areas targeted by this assay, and inadequate number of viral copies(<138 copies/mL). A negative result must be combined with clinical observations, patient history, and epidemiological information. The expected result is Negative.  Fact Sheet for Patients:  EntrepreneurPulse.com.au  Fact Sheet for Healthcare Providers:  IncredibleEmployment.be  This test is no t yet approved or cleared by the [place] FDA and  has been authorized for detection and/or diagnosis of SARS-CoV-2 by FDA under an Emergency Use Authorization (EUA). This EUA will remain  in effect (meaning this test can be used) for the duration of the COVID-19 declaration under Section 564(b)(1) of the Act, 21 U.S.C.section 360bbb-3(b)(1), unless the authorization is terminated  or revoked sooner.       Influenza A by PCR NEGATIVE NEGATIVE Final   Influenza B by PCR NEGATIVE NEGATIVE Final    Comment: (NOTE) The Xpert Xpress SARS-CoV-2/FLU/RSV plus assay is intended as an aid in the diagnosis of influenza from Nasopharyngeal swab specimens and should not be used as a sole basis for treatment. Nasal washings and aspirates are unacceptable for Xpert Xpress SARS-CoV-2/FLU/RSV testing.  Fact Sheet for Patients: EntrepreneurPulse.com.au  Fact Sheet for Healthcare Providers: IncredibleEmployment.be  This test is not yet approved or cleared by the [place] FDA and has been authorized for detection  and/or diagnosis of SARS-CoV-2 by FDA under an Emergency Use Authorization (EUA). This EUA will remain in effect (meaning this test can be used) for the duration of the COVID-19 declaration under Section 564(b)(1) of the Act, 21 U.S.C. section 360bbb-3(b)(1), unless the authorization is  terminated or revoked.  Performed at [facility], [address], [place], [place] [address]          Radiology Studies: [facility] Chest [place] 1 View  Result Date: 11/27/2020 CLINICAL DATA:  Fever and chest pain. EXAM: PORTABLE CHEST 1 VIEW COMPARISON:  And CT chest, abdomen and pelvis 11/21/2020. Single-view of the chest 10/01/2020. FINDINGS: Subtle micro nodules are seen in the right upper lobe. The left lung demonstrates minimal atelectasis in the base. Heart size is normal. No pneumothorax or pleural fluid. No acute or focal bony abnormality. IMPRESSION: Subtle focus of mild micronodularity in the right upper lobe compatible with residual infectious or inflammatory process as seen on prior CT. Recommend continued follow-up to clearing. Electronically Signed   By: Inge Rise M.D.   On: 11/27/2020 11:36   [place] EKG SITE RITE  Result Date: 11/27/2020 If Site Rite image not attached, placement could not be confirmed due to current cardiac rhythm.       Scheduled Meds:  acetaminophen  1,000 mg Oral Once   chlorhexidine  15 mL Mouth Rinse BID   Chlorhexidine Gluconate Cloth  6 each Topical Daily   diltiazem  120 mg Oral Daily   feeding supplement  1 Container Oral TID BM   hydrALAZINE  50 mg Oral Q8H   insulin aspart  0-15 Units Subcutaneous Q4H   levothyroxine  100 mcg Oral QAC breakfast   magic mouthwash w/lidocaine  15 mL Oral TID   magnesium oxide  800 mg Oral BID   mouth rinse  15 mL Mouth Rinse q12n4p   mirtazapine  15 mg Oral QHS   multivitamin with minerals  1 tablet Oral Daily   pantoprazole  40 mg Oral Daily   polyethylene glycol  34 g Oral BID   rosuvastatin  10 mg Oral QHS   sodium chloride flush  10-40 mL Intracatheter Q12H   [START ON 11/29/2020] thiamine  100 mg Oral Daily   thiamine  500 mg Oral TID   Continuous Infusions:     LOS: 7 days     Enzo Bi, MD [facility] Pager [phone]  If 7PM-7AM, please contact  night-coverage 11/27/2020, 5:32 PM

## 2020-11-27 NOTE — Progress Notes (Signed)
Peripherally Inserted Central Catheter Placement  The IV Nurse has discussed with the patient and/or persons authorized to consent for the patient, the purpose of this procedure and the potential benefits and risks involved with this procedure.  The benefits include less needle sticks, lab draws from the catheter, and the patient may be discharged home with the catheter. Risks include, but not limited to, infection, bleeding, blood clot (thrombus formation), and puncture of an artery; nerve damage and irregular heartbeat and possibility to perform a PICC exchange if needed/ordered by physician.  Alternatives to this procedure were also discussed.  Bard Power PICC patient education guide, fact sheet on infection prevention and patient information card has been provided to patient /or left at bedside.    PICC Placement Documentation  PICC Single Lumen 99/83/38 Right Basilic 42 cm 2 cm (Active)  Indication for Insertion or Continuance of Line Poor Vasculature-patient has had multiple peripheral attempts or PIVs lasting less than 24 hours 11/27/20 1007  Exposed Catheter (cm) 2 cm 11/27/20 1007  Site Assessment Clean;Dry;Intact 11/27/20 1007  Line Status Flushed;Saline locked;Blood return noted 11/27/20 1007  Dressing Type Transparent;Securing device 11/27/20 1007  Dressing Status Clean;Dry;Intact 11/27/20 1007  Antimicrobial disc in place? Yes 11/27/20 1007  Safety Lock Not Applicable 25/05/39 7673  Dressing Intervention New dressing 11/27/20 1007  Dressing Change Due 12/04/20 11/27/20 [place], [place] 11/27/2020, 10:08 AM

## 2020-11-27 NOTE — Care Management Important Message (Signed)
Important Message  Patient Details  Name: Tonya Myers MRN: [id number] Date of Birth: 1966/02/11   Medicare Important Message Given:  Yes     [name] 11/27/2020, 10:18 AM

## 2020-11-28 ENCOUNTER — Inpatient Hospital Stay: Payer: Medicare Other

## 2020-11-28 DIAGNOSIS — K858 Other acute pancreatitis without necrosis or infection: Secondary | ICD-10-CM | POA: Diagnosis not present

## 2020-11-28 DIAGNOSIS — G0490 Encephalitis and encephalomyelitis, unspecified: Secondary | ICD-10-CM

## 2020-11-28 DIAGNOSIS — R410 Disorientation, unspecified: Secondary | ICD-10-CM | POA: Diagnosis not present

## 2020-11-28 LAB — GLUCOSE, CAPILLARY
Glucose-Capillary: 161 mg/dL — ABNORMAL HIGH (ref 70–99)
Glucose-Capillary: 83 mg/dL (ref 70–99)
Glucose-Capillary: 238 mg/dL — ABNORMAL HIGH (ref 70–99)
Glucose-Capillary: 152 mg/dL — ABNORMAL HIGH (ref 70–99)
Glucose-Capillary: 91 mg/dL (ref 70–99)

## 2020-11-28 LAB — CBC
HCT: 22.4 % — ABNORMAL LOW (ref 36.0–46.0)
Hemoglobin: 7.1 g/dL — ABNORMAL LOW (ref 12.0–15.0)
MCH: 26.6 pg (ref 26.0–34.0)
MCHC: 31.7 g/dL (ref 30.0–36.0)
MCV: 83.9 fL (ref 80.0–100.0)
Platelets: 87 10*3/uL — ABNORMAL LOW (ref 150–400)
RBC: 2.67 MIL/uL — ABNORMAL LOW (ref 3.87–5.11)
RDW: 17.3 % — ABNORMAL HIGH (ref 11.5–15.5)
WBC: 4.1 10*3/uL (ref 4.0–10.5)
nRBC: 0 % (ref 0.0–0.2)

## 2020-11-28 LAB — PROCALCITONIN: Procalcitonin: 2.07 ng/mL

## 2020-11-28 LAB — HEPATIC FUNCTION PANEL
ALT: 6 U/L (ref 0–44)
AST: 45 U/L — ABNORMAL HIGH (ref 15–41)
Albumin: 2 g/dL — ABNORMAL LOW (ref 3.5–5.0)
Alkaline Phosphatase: 71 U/L (ref 38–126)
Bilirubin, Direct: 0.1 mg/dL (ref 0.0–0.2)
Indirect Bilirubin: 0.3 mg/dL (ref 0.3–0.9)
Total Bilirubin: 0.4 mg/dL (ref 0.3–1.2)
Total Protein: 5.3 g/dL — ABNORMAL LOW (ref 6.5–8.1)

## 2020-11-28 LAB — BASIC METABOLIC PANEL
Anion gap: 3 — ABNORMAL LOW (ref 5–15)
BUN: 12 mg/dL (ref 6–20)
CO2: 25 mmol/L (ref 22–32)
Calcium: 7.5 mg/dL — ABNORMAL LOW (ref 8.9–10.3)
Chloride: 108 mmol/L (ref 98–111)
Creatinine, Ser: 1.35 mg/dL — ABNORMAL HIGH (ref 0.44–1.00)
GFR, Estimated: 46 mL/min — ABNORMAL LOW (ref >60)
Glucose, Bld: 165 mg/dL — ABNORMAL HIGH (ref 70–99)
Potassium: 3.4 mmol/L — ABNORMAL LOW (ref 3.5–5.1)
Sodium: 136 mmol/L (ref 135–145)

## 2020-11-28 LAB — ABO/RH: ABO/RH(D): O POS

## 2020-11-28 LAB — PREPARE RBC (CROSSMATCH)

## 2020-11-28 LAB — MAGNESIUM: Magnesium: 1.6 mg/dL — ABNORMAL LOW (ref 1.7–2.4)

## 2020-11-28 LAB — LIPASE, BLOOD: Lipase: 387 U/L — ABNORMAL HIGH (ref 11–51)

## 2020-11-28 LAB — PHOSPHORUS: Phosphorus: 3.2 mg/dL (ref 2.5–4.6)

## 2020-11-28 IMAGING — MR MR LUMBAR SPINE WO/W CM
6 of 7 series · 31 of 48 positions shown · IV contrast (gadavist)
Comparison: MRI of the cervical, thoracic and lumbar spine [facility]

CLINICAL DATA: History of epidural abscess and paraspinal abscess
in the setting of recurrent fever and altered mental status.

EXAM:
MRI CERVICAL, THORACIC AND LUMBAR SPINE WITHOUT AND WITH CONTRAST
TECHNIQUE: Multiplanar and multiecho pulse sequences of the cervical spine, to
include the craniocervical junction and cervicothoracic junction,
and thoracic and lumbar spine, were obtained without and with
intravenous contrast.
CONTRAST:  8mL GADAVIST GADOBUTROL 1 MMOL/ML IV SOLN

[Series 36: T2 · sagittal · 4.0mm · 1.02mm/px · 5 of 15 slices shown (1 of 2)]
[im 1/15]
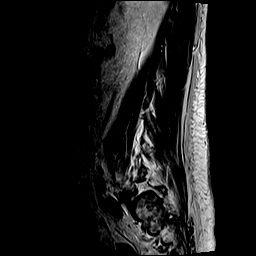
[im 4/15]
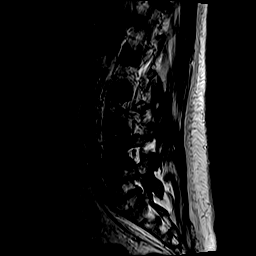
[im 8/15]
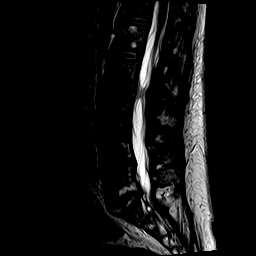
[im 11/15]
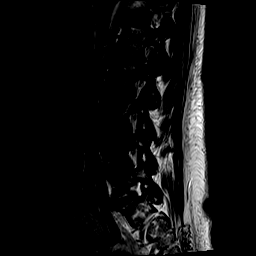
[im 15/15]
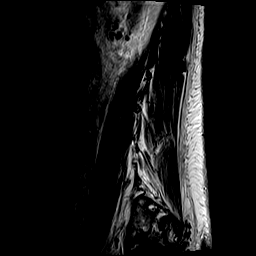

[Series 37: T1 · sagittal · 4.0mm · 1.02mm/px · 5 of 15 slices shown (1 of 2)]
[im 1/15]
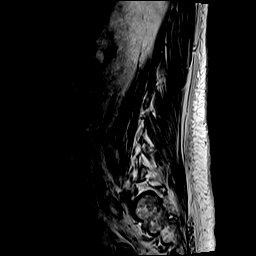
[im 4/15]
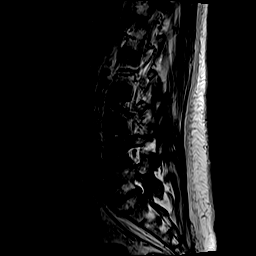
[im 8/15]
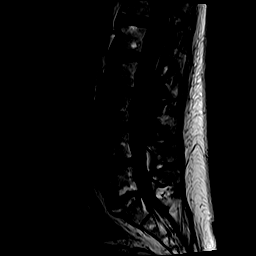
[im 11/15]
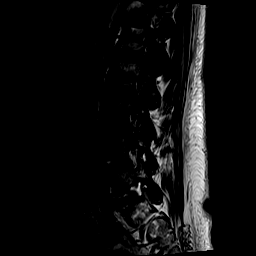
[im 15/15]
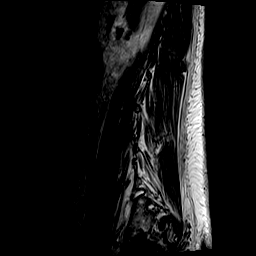

[Series 38: STIR · sagittal · 4.0mm · 0.51mm/px · 1 of 15 slices shown]
[im 1/15]
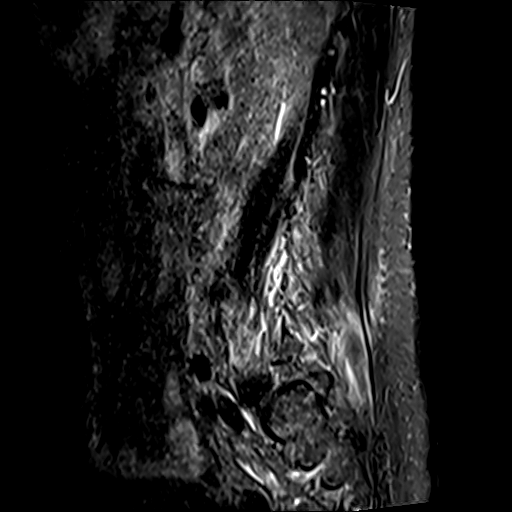

[Series 39: T2 · axial · 4.0mm · 0.78mm/px · z∈[-683,-456]mm · 8 of 38 slices shown (2 of 2)]
[im 1/38]
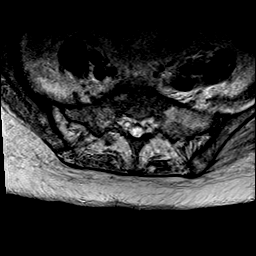
[im 5/38]
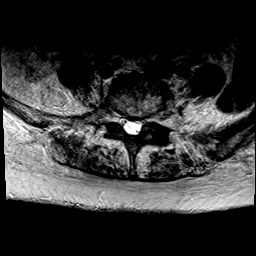
[im 13/38]
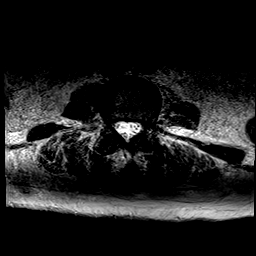
[im 17/38]
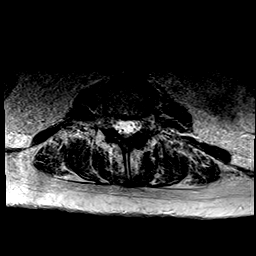
[im 21/38]
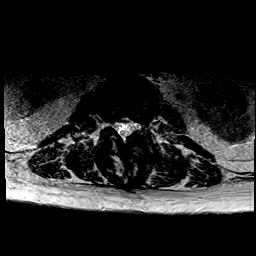
[im 25/38]
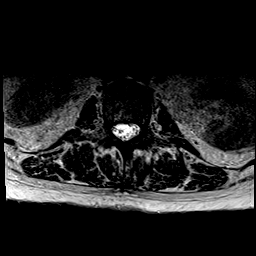
[im 33/38]
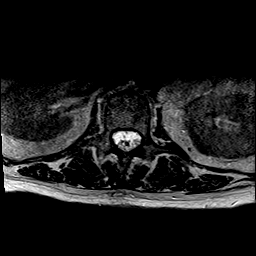
[im 38/38]
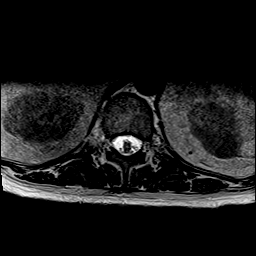

[Series 40: T1 · axial · 4.0mm · 0.39mm/px · z∈[-683,-456]mm · 8 of 38 slices shown (2 of 2)]
[im 1/38]
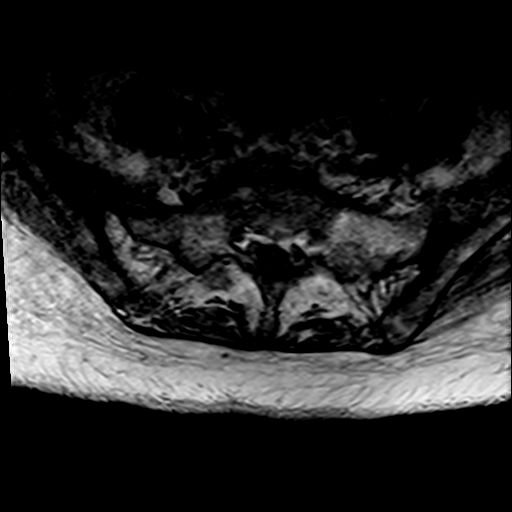
[im 5/38]
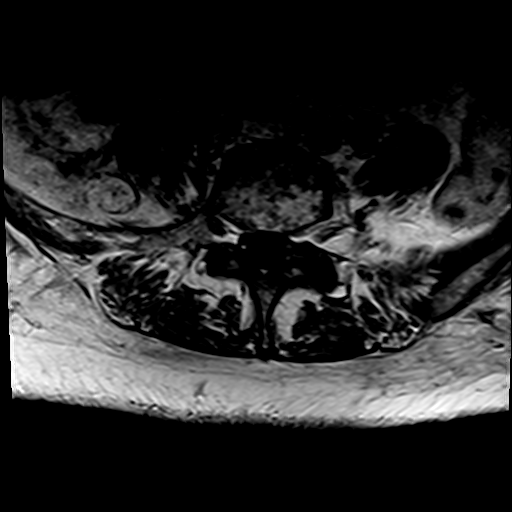
[im 13/38]
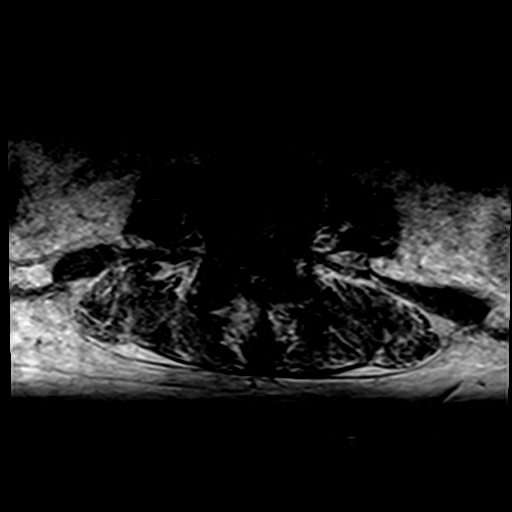
[im 17/38]
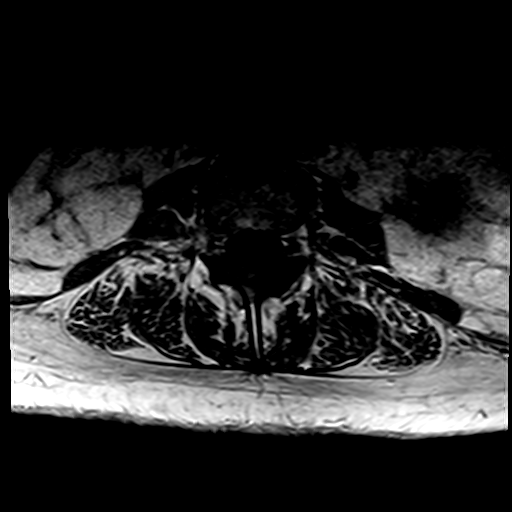
[im 21/38]
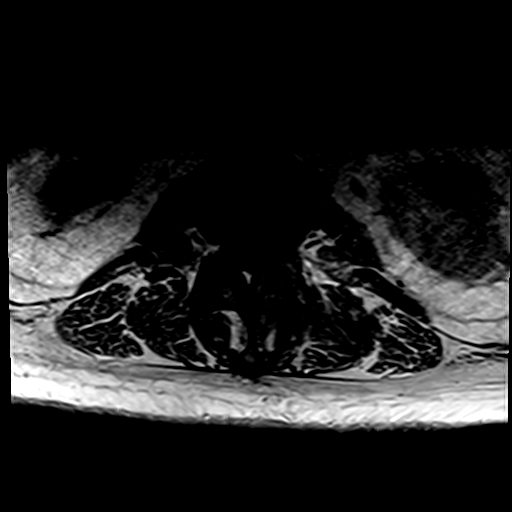
[im 25/38]
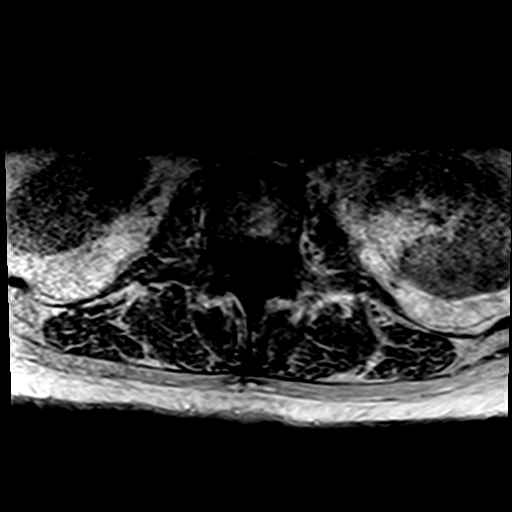
[im 33/38]
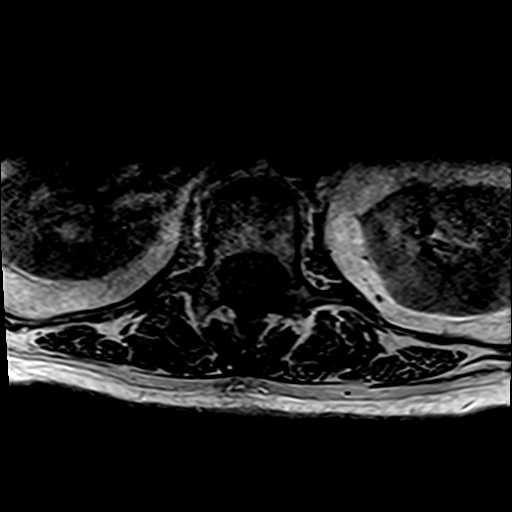
[im 38/38]
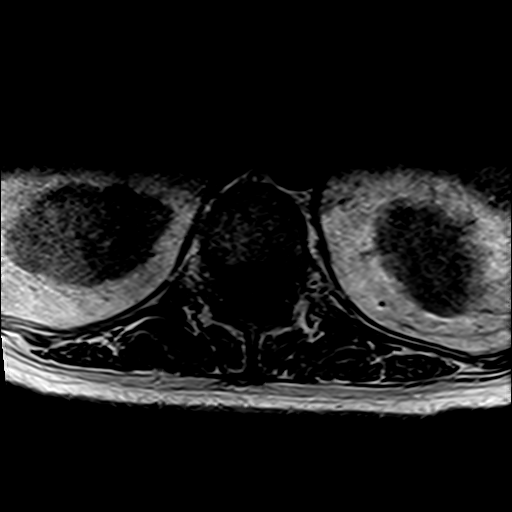

[Series 54: T1 fat-sat post-contrast · sagittal · 4.0mm · 1.02mm/px · 4 of 15 slices shown]
[im 1/15]
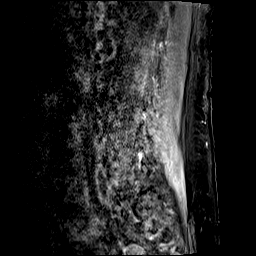
[im 5/15]
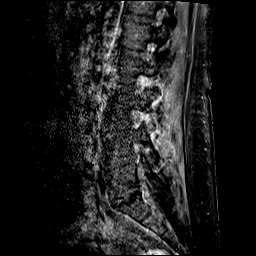
[im 10/15]
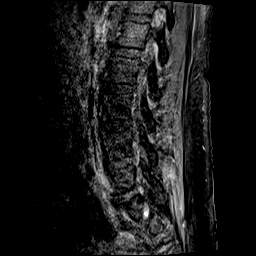
[im 15/15]
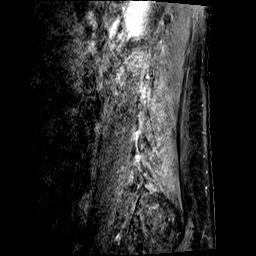

[31 of 48 positions shown; findings below may reference images not displayed]

FINDINGS: The study is partially degraded by motion. Postcontrast axial images
are essentially nondiagnostic.

MRI CERVICAL SPINE FINDINGS

Alignment: Physiologic.

Vertebrae: No fracture, evidence of discitis, or bone lesion.

Cord: Increased T2 signal within the cord at the C3 level.

Posterior Fossa, vertebral arteries, paraspinal tissues: Negative.

Disc levels:

C2-3: Posterior disc protrusion causing mild mass effect on the cord
without significant spinal canal stenosis. Facet degenerative
changes resulting mild left neural foraminal narrowing.

C3-4: Left posterolateral disc protrusion causing moderate to severe
spinal canal stenosis with mass effect on the cord. Increased T2
signal is seen within the cord immediately above the level of
compression, likely edema. Uncovertebral and facet degenerative
changes resulting in moderate right and severe left neural foraminal
narrowing.

C4-5: Posterior disc protrusion asymmetric to the left causing mass
effect on the cord and resulting in moderate spinal canal stenosis.
Uncovertebral and facet degenerative changes resulting in severe
bilateral neural foraminal narrowing.

C5-6: Posterior disc osteophyte complex causing mass effect on the
cord and resulting in moderate spinal canal stenosis. Uncovertebral
and facet degenerative changes resulting severe bilateral neural
foraminal narrowing.

C6-7: Left posterolateral disc protrusion causing indentation of the
thecal sac and resulting mild spinal canal stenosis. Uncovertebral
and facet degenerative changes resulting in mild bilateral neural
foraminal narrowing.

C7-T1: Left posterolateral disc protrusion causing indentation on
the thecal sac and resulting in mild left neural foraminal
narrowing. No significant spinal canal stenosis.

MRI THORACIC SPINE FINDINGS

Alignment:  Physiologic.

Vertebrae: No fracture, evidence of discitis, or bone lesion.
Postsurgical changes from T3-4, T5-7 and T9-10 laminectomies.
Interval resolution of the large posterior epidural fluid
collection.

Cord: Central increased T2 signal within the cord at T6-7, T7-8 and
from the T8-9 level to the conus medullaris.

Paraspinal and other soft tissues: Small bilateral pleural effusion.
A 1.5 cm T2 hyperintense hepatic lesion.

Disc levels:

Tiny posterior disc protrusions at T3-4, T5-6, T6-7 and T7-8 causing
minimal indentation on the thecal sac. No significant spinal canal
or neural foraminal stenosis at any thoracic level.

MRI LUMBAR SPINE FINDINGS

Segmentation:  Standard.

Alignment:  Physiologic.

Vertebrae: No fracture, evidence of discitis, or bone lesion.
Endplate degenerative changes at L5-S1.

Conus medullaris and cauda equina: Conus extends to the L1-2 level.
Increased T2 signal within the conus medullaris, as described above.
There is clumping of the roots of the cauda equina, particularly at
L2-3 suggesting adhesive arachnoiditis.

Paraspinal and other soft tissues: Negative.

Disc levels:

T12-L1: No spinal canal or neural foraminal stenosis.

L1-2: No spinal canal or neural foraminal stenosis.

L2-3: Shallow disc bulge and moderate facet degenerative changes
without significant spinal canal or neural foraminal stenosis.

L3-4: Moderate facet degenerative changes. No spinal canal or neural
foraminal stenosis.

L4-5: Mild loss of disc height, disc bulge and moderate facet
degenerative changes without significant spinal canal or neural
foraminal stenosis.

L5-S1: Loss of disc height, disc bulge with superimposed left
central/subarticular disc protrusion and moderate facet degenerative
changes. Findings result in narrowing of the left subarticular zone
with displacement of the traversing left S1 nerve root. No
significant neural foraminal narrowing.
IMPRESSION: 1. The study is degraded by motion, particularly the postcontrast
images.
2. Interval resolution of the large posterior thoracic epidural
fluid collection with postsurgical changes from multilevel
laminectomy noted in the thoracic spine. No new fluid collection
identified.
3. Increased T2 signal within the cord centrally at T6-7, T7-8 and
from the T8-9 level to the conus medullaris without cord expansion.
This may represent post ischemic changes related to now resolved
cord compression.
4. Small focus of increased T2 signal within the cord at the C3
level it is likely related to compressive myelopathy.
5. Advanced degenerative changes of the cervical spine, with
moderate to severe spinal canal stenosis at C3-4 and moderate at
C4-5 and C5-6.
6. Multilevel high-grade neural foraminal narrowing at the cervical
spine.
7. Mild degenerative changes of the thoracic spine without spinal
canal or neural foraminal stenosis.
8. Degenerative disc disease at L5-S1 with narrowing of the left
subarticular zone and likely impingement of the traversing left S1
nerve root.

## 2020-11-28 IMAGING — MR MR CERVICAL SPINE WO/W CM
5 of 8 series · 26 of 48 positions shown · IV contrast (8ml Gadavist)
Comparison: MRI of the cervical, thoracic and lumbar spine [facility]

CLINICAL DATA: History of epidural abscess and paraspinal abscess
in the setting of recurrent fever and altered mental status.

EXAM:
MRI CERVICAL, THORACIC AND LUMBAR SPINE WITHOUT AND WITH CONTRAST
TECHNIQUE: Multiplanar and multiecho pulse sequences of the cervical spine, to
include the craniocervical junction and cervicothoracic junction,
and thoracic and lumbar spine, were obtained without and with
intravenous contrast.
CONTRAST:  8mL GADAVIST GADOBUTROL 1 MMOL/ML IV SOLN

[Series 41: T2 · sagittal · 3.0mm · 0.62mm/px · 4 of 15 slices shown (1 of 2)]
[im 1/15]
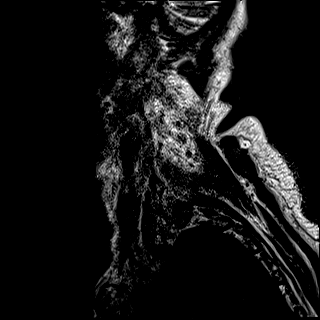
[im 5/15]
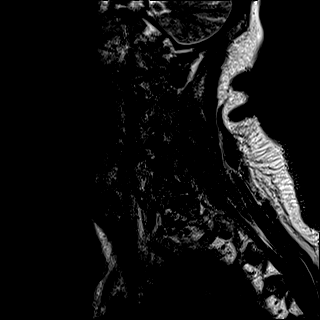
[im 10/15]
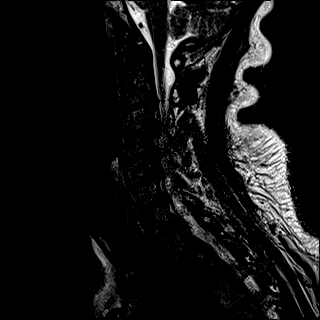
[im 15/15]
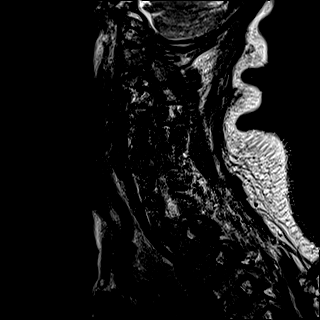

[Series 43: STIR · sagittal · 3.0mm · 0.62mm/px · 4 of 15 slices shown]
[im 1/15]
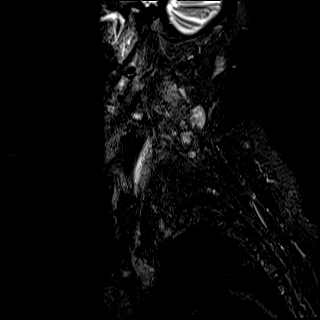
[im 5/15]
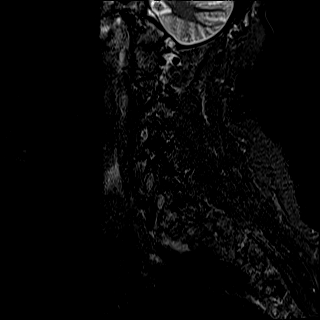
[im 10/15]
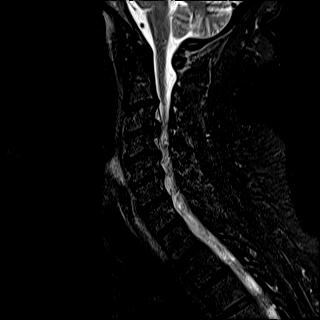
[im 15/15]
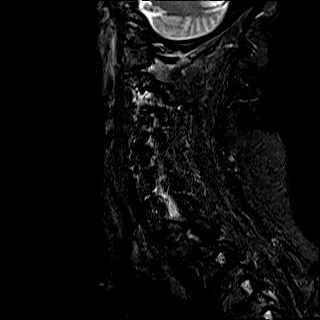

[Series 44: T2 · axial · 3.0mm · 0.70mm/px · z∈[-230,-134]mm · 8 of 29 slices shown (2 of 2)]
[im 1/29]
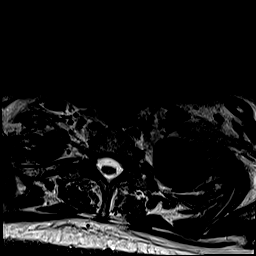
[im 5/29]
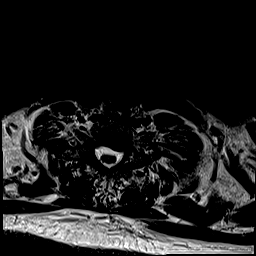
[im 9/29]
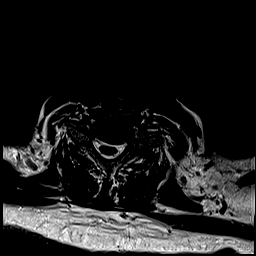
[im 13/29]
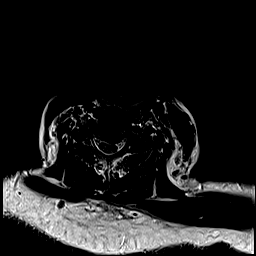
[im 17/29]
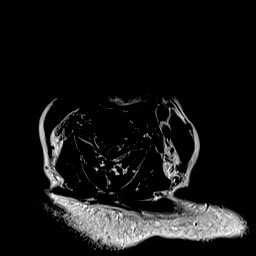
[im 21/29]
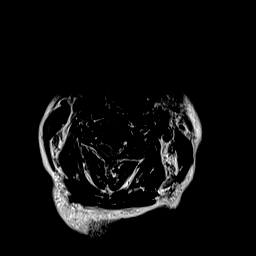
[im 25/29]
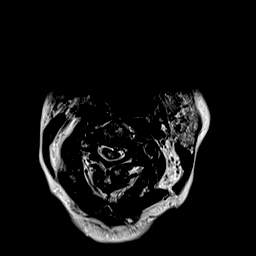
[im 29/29]
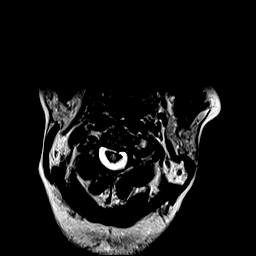

[Series 46: T1 · axial · non-contrast · 3.0mm · 0.35mm/px · z∈[-230,-134]mm · 8 of 29 slices shown]
[im 1/29]
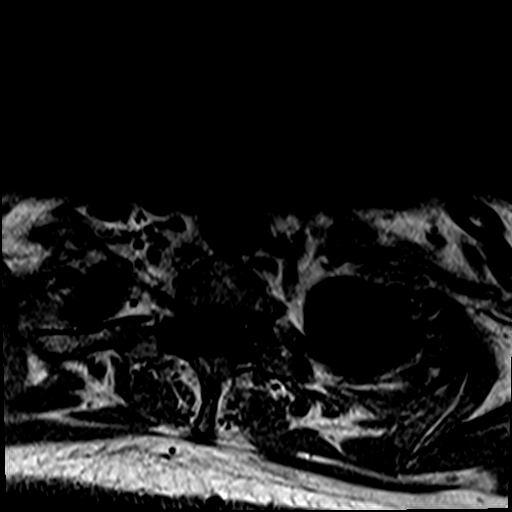
[im 5/29]
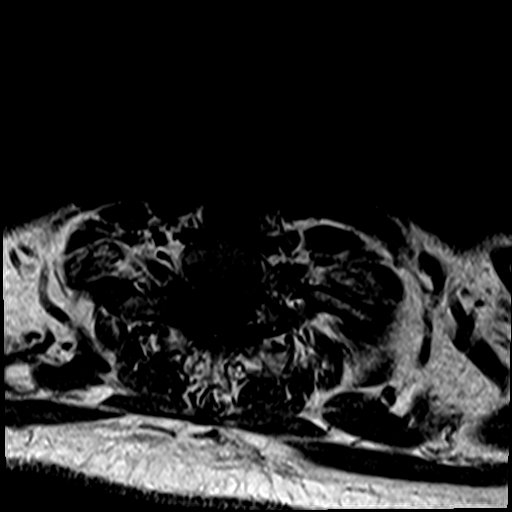
[im 9/29]
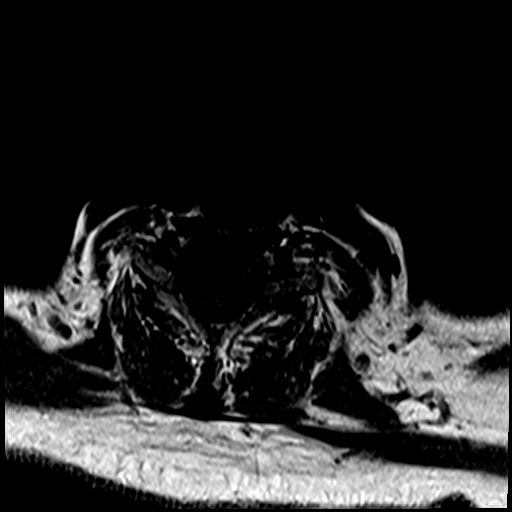
[im 13/29]
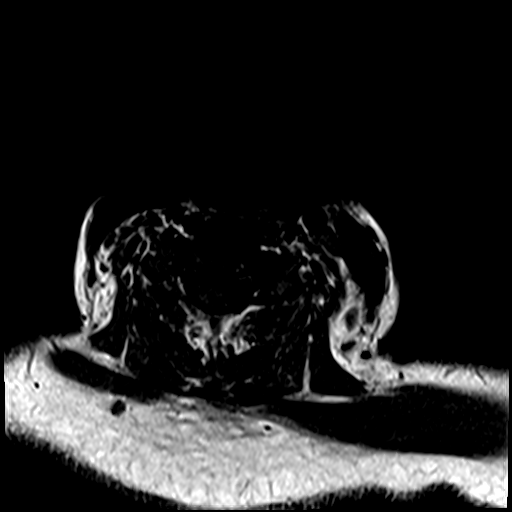
[im 17/29]
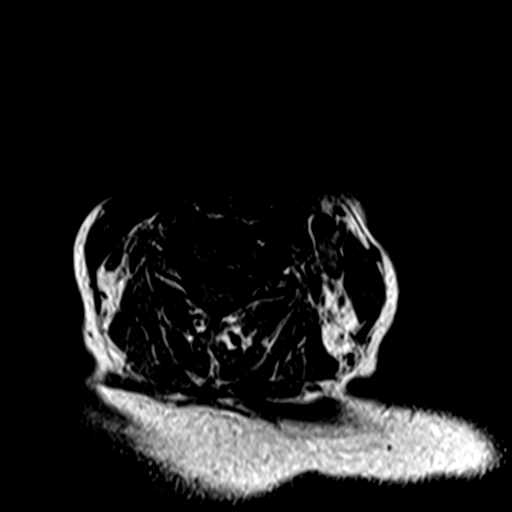
[im 21/29]
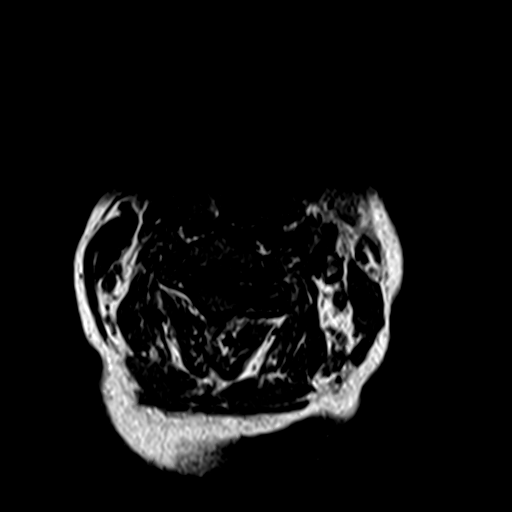
[im 25/29]
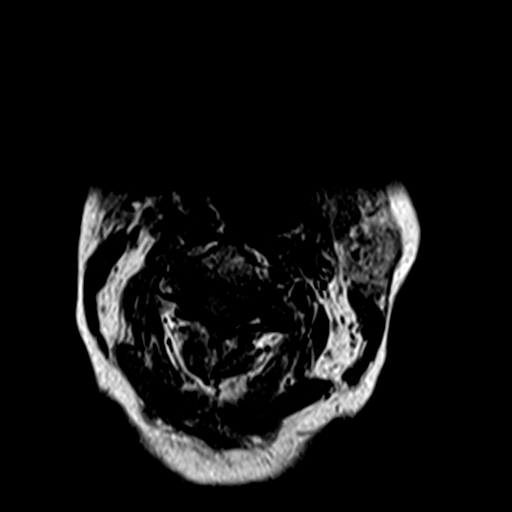
[im 29/29]
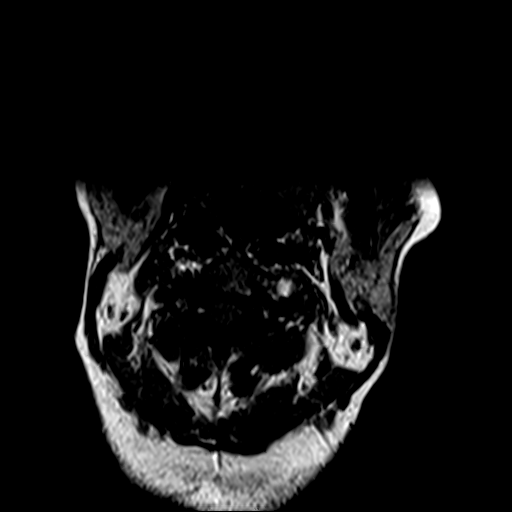

[Series 48: T1 post-contrast · axial · 3.0mm · 0.35mm/px · z∈[-230,-216]mm · 2 of 29 slices shown]
[im 1/29]
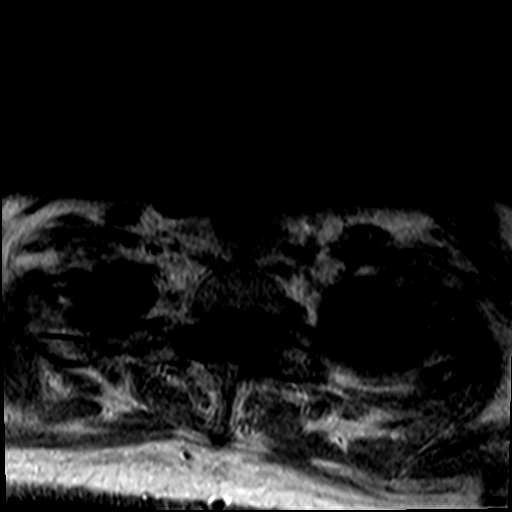
[im 5/29]
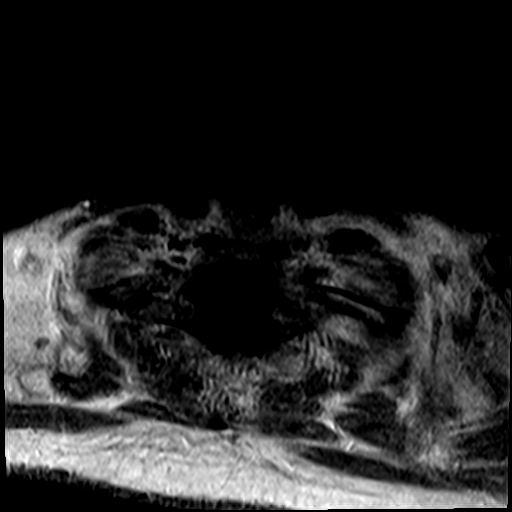

[26 of 48 positions shown; findings below may reference images not displayed]

FINDINGS: The study is partially degraded by motion. Postcontrast axial images
are essentially nondiagnostic.

MRI CERVICAL SPINE FINDINGS

Alignment: Physiologic.

Vertebrae: No fracture, evidence of discitis, or bone lesion.

Cord: Increased T2 signal within the cord at the C3 level.

Posterior Fossa, vertebral arteries, paraspinal tissues: Negative.

Disc levels:

C2-3: Posterior disc protrusion causing mild mass effect on the cord
without significant spinal canal stenosis. Facet degenerative
changes resulting mild left neural foraminal narrowing.

C3-4: Left posterolateral disc protrusion causing moderate to severe
spinal canal stenosis with mass effect on the cord. Increased T2
signal is seen within the cord immediately above the level of
compression, likely edema. Uncovertebral and facet degenerative
changes resulting in moderate right and severe left neural foraminal
narrowing.

C4-5: Posterior disc protrusion asymmetric to the left causing mass
effect on the cord and resulting in moderate spinal canal stenosis.
Uncovertebral and facet degenerative changes resulting in severe
bilateral neural foraminal narrowing.

C5-6: Posterior disc osteophyte complex causing mass effect on the
cord and resulting in moderate spinal canal stenosis. Uncovertebral
and facet degenerative changes resulting severe bilateral neural
foraminal narrowing.

C6-7: Left posterolateral disc protrusion causing indentation of the
thecal sac and resulting mild spinal canal stenosis. Uncovertebral
and facet degenerative changes resulting in mild bilateral neural
foraminal narrowing.

C7-T1: Left posterolateral disc protrusion causing indentation on
the thecal sac and resulting in mild left neural foraminal
narrowing. No significant spinal canal stenosis.

MRI THORACIC SPINE FINDINGS

Alignment:  Physiologic.

Vertebrae: No fracture, evidence of discitis, or bone lesion.
Postsurgical changes from T3-4, T5-7 and T9-10 laminectomies.
Interval resolution of the large posterior epidural fluid
collection.

Cord: Central increased T2 signal within the cord at T6-7, T7-8 and
from the T8-9 level to the conus medullaris.

Paraspinal and other soft tissues: Small bilateral pleural effusion.
A 1.5 cm T2 hyperintense hepatic lesion.

Disc levels:

Tiny posterior disc protrusions at T3-4, T5-6, T6-7 and T7-8 causing
minimal indentation on the thecal sac. No significant spinal canal
or neural foraminal stenosis at any thoracic level.

MRI LUMBAR SPINE FINDINGS

Segmentation:  Standard.

Alignment:  Physiologic.

Vertebrae: No fracture, evidence of discitis, or bone lesion.
Endplate degenerative changes at L5-S1.

Conus medullaris and cauda equina: Conus extends to the L1-2 level.
Increased T2 signal within the conus medullaris, as described above.
There is clumping of the roots of the cauda equina, particularly at
L2-3 suggesting adhesive arachnoiditis.

Paraspinal and other soft tissues: Negative.

Disc levels:

T12-L1: No spinal canal or neural foraminal stenosis.

L1-2: No spinal canal or neural foraminal stenosis.

L2-3: Shallow disc bulge and moderate facet degenerative changes
without significant spinal canal or neural foraminal stenosis.

L3-4: Moderate facet degenerative changes. No spinal canal or neural
foraminal stenosis.

L4-5: Mild loss of disc height, disc bulge and moderate facet
degenerative changes without significant spinal canal or neural
foraminal stenosis.

L5-S1: Loss of disc height, disc bulge with superimposed left
central/subarticular disc protrusion and moderate facet degenerative
changes. Findings result in narrowing of the left subarticular zone
with displacement of the traversing left S1 nerve root. No
significant neural foraminal narrowing.
IMPRESSION: 1. The study is degraded by motion, particularly the postcontrast
images.
2. Interval resolution of the large posterior thoracic epidural
fluid collection with postsurgical changes from multilevel
laminectomy noted in the thoracic spine. No new fluid collection
identified.
3. Increased T2 signal within the cord centrally at T6-7, T7-8 and
from the T8-9 level to the conus medullaris without cord expansion.
This may represent post ischemic changes related to now resolved
cord compression.
4. Small focus of increased T2 signal within the cord at the C3
level it is likely related to compressive myelopathy.
5. Advanced degenerative changes of the cervical spine, with
moderate to severe spinal canal stenosis at C3-4 and moderate at
C4-5 and C5-6.
6. Multilevel high-grade neural foraminal narrowing at the cervical
spine.
7. Mild degenerative changes of the thoracic spine without spinal
canal or neural foraminal stenosis.
8. Degenerative disc disease at L5-S1 with narrowing of the left
subarticular zone and likely impingement of the traversing left S1
nerve root.

## 2020-11-28 IMAGING — MR MR THORACIC SPINE WO/W CM
7 of 9 series · 30 of 48 positions shown · IV contrast (gadavist)
Comparison: MRI of the cervical, thoracic and lumbar spine [facility]

CLINICAL DATA: History of epidural abscess and paraspinal abscess
in the setting of recurrent fever and altered mental status.

EXAM:
MRI CERVICAL, THORACIC AND LUMBAR SPINE WITHOUT AND WITH CONTRAST
TECHNIQUE: Multiplanar and multiecho pulse sequences of the cervical spine, to
include the craniocervical junction and cervicothoracic junction,
and thoracic and lumbar spine, were obtained without and with
intravenous contrast.
CONTRAST:  8mL GADAVIST GADOBUTROL 1 MMOL/ML IV SOLN

[Series 29: T1 · sagittal · 6.0mm · 1.88mm/px · 1 of 9 slices shown (1 of 2)]
[im 1/9]
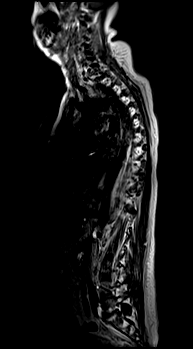

[Series 30: T2 · sagittal · 3.0mm · 1.33mm/px · 3 of 17 slices shown (1 of 2)]
[im 1/17]
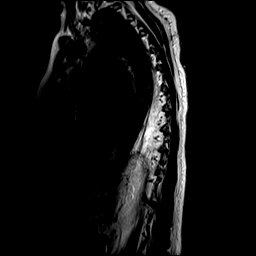
[im 9/17]
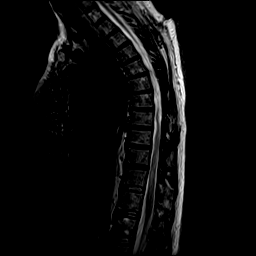
[im 17/17]
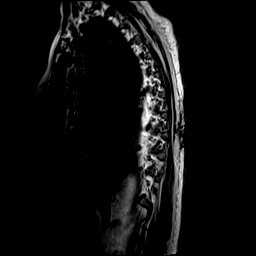

[Series 31: T1 · sagittal · 3.0mm · 1.33mm/px · 4 of 17 slices shown (2 of 2)]
[im 1/17]
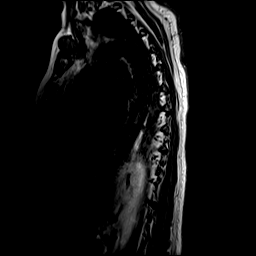
[im 6/17]
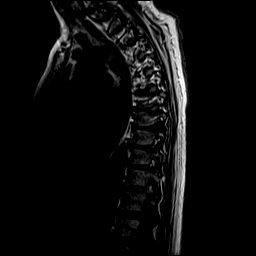
[im 11/17]
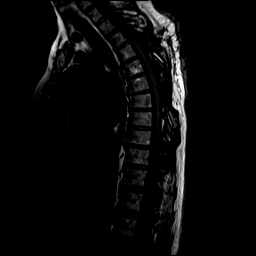
[im 17/17]
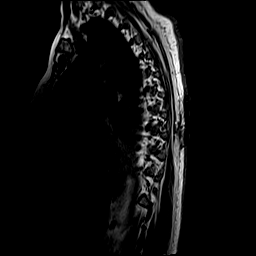

[Series 32: STIR · sagittal · 3.0mm · 0.66mm/px · 4 of 17 slices shown]
[im 1/17]
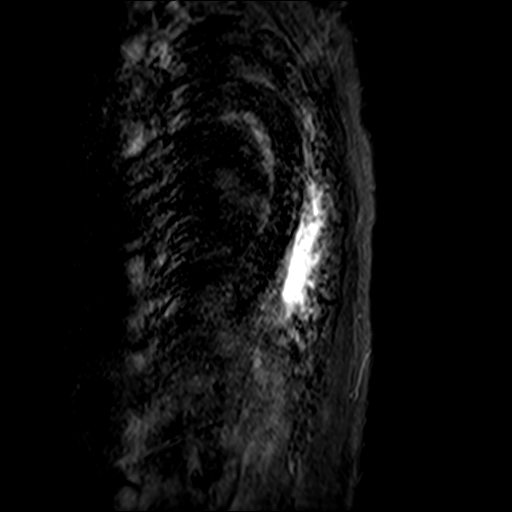
[im 6/17]
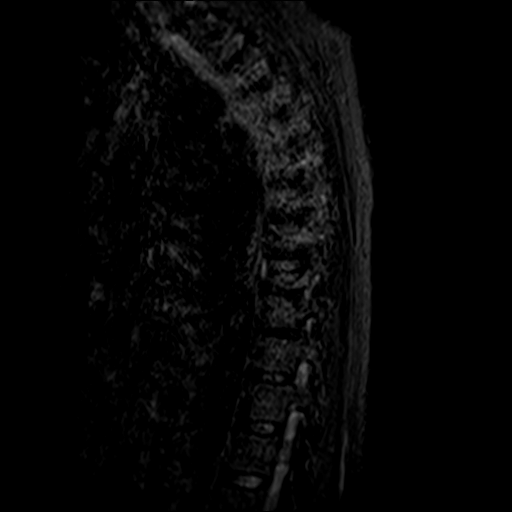
[im 11/17]
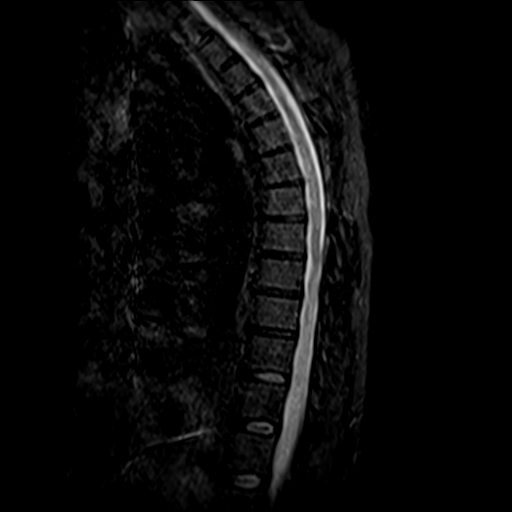
[im 17/17]
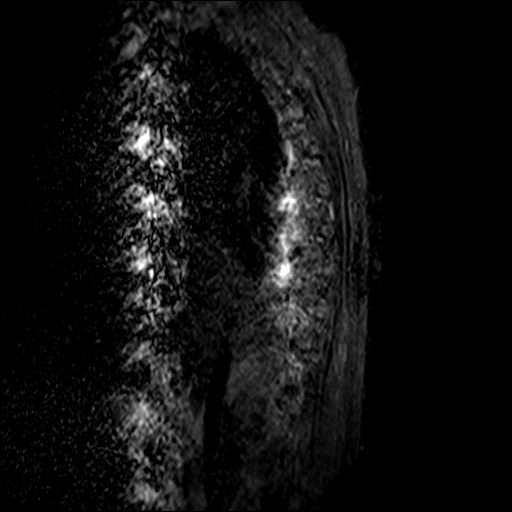

[Series 33: T2 · axial · 4.0mm · 0.74mm/px · z∈[-466,-250]mm · 8 of 39 slices shown (2 of 2)]
[im 1/39]
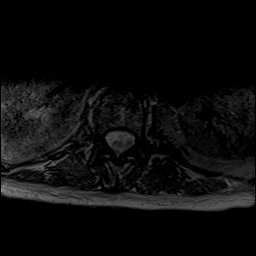
[im 6/39]
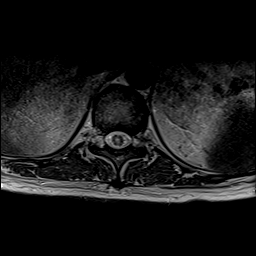
[im 11/39]
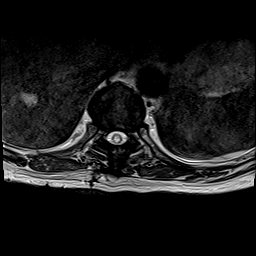
[im 17/39]
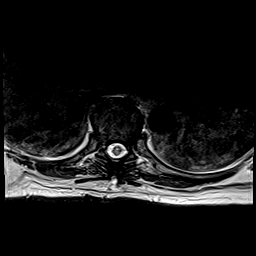
[im 22/39]
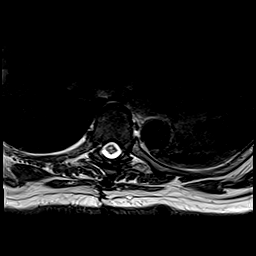
[im 28/39]
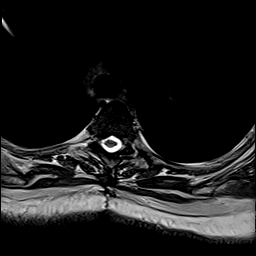
[im 33/39]
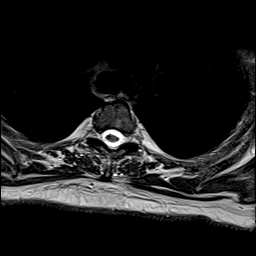
[im 39/39]
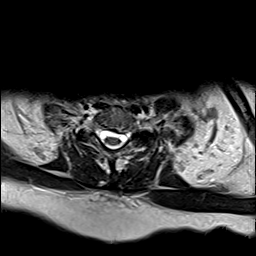

[Series 35: T1 post-contrast · axial · non-contrast · 4.0mm · 0.37mm/px · z∈[-466,-300]mm · 6 of 39 slices shown]
[im 1/39]
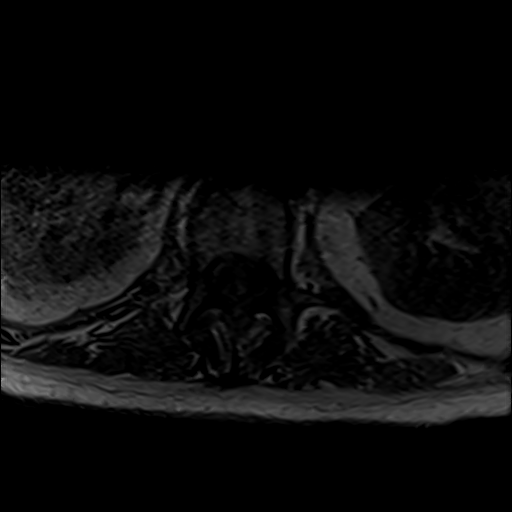
[im 6/39]
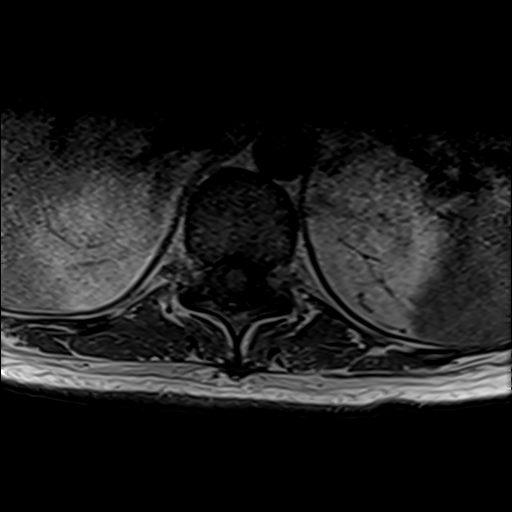
[im 11/39]
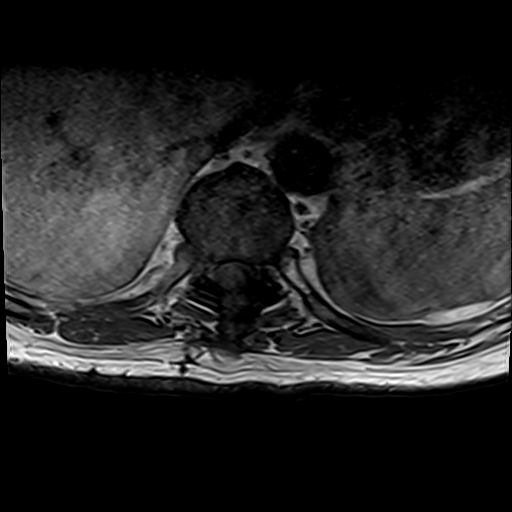
[im 17/39]
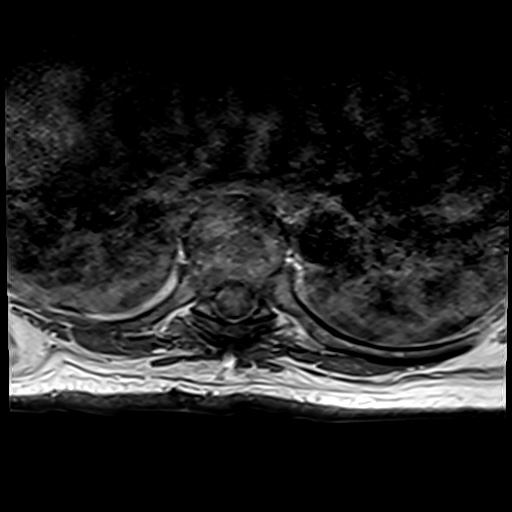
[im 22/39]
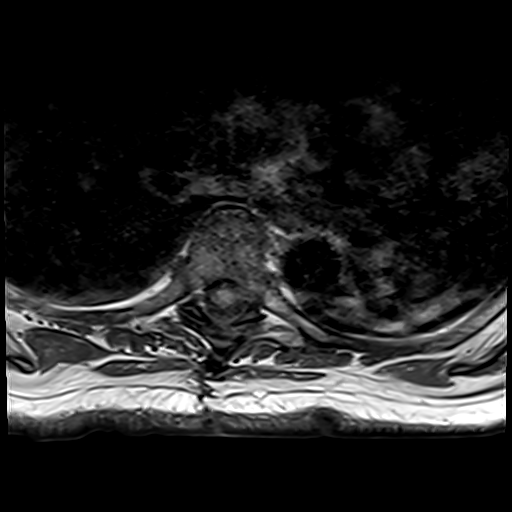
[im 28/39]
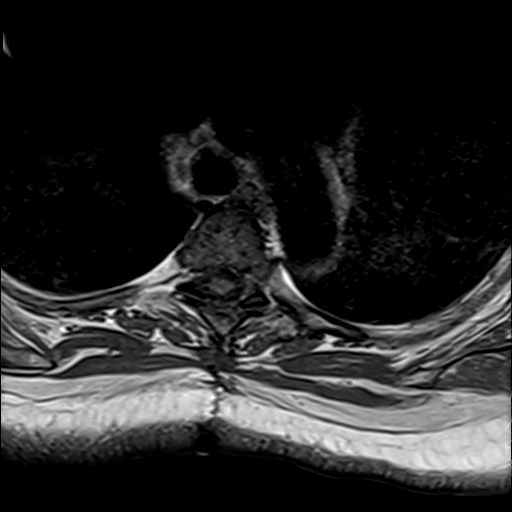

[Series 52: T1 fat-sat post-contrast · sagittal · 3.0mm · 1.33mm/px · 4 of 17 slices shown]
[im 1/17]
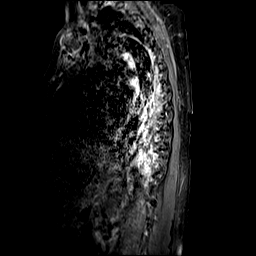
[im 6/17]
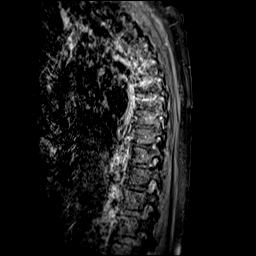
[im 11/17]
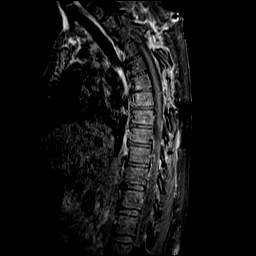
[im 17/17]
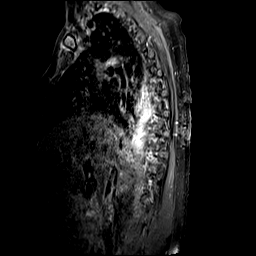

[30 of 48 positions shown; findings below may reference images not displayed]

FINDINGS: The study is partially degraded by motion. Postcontrast axial images
are essentially nondiagnostic.

MRI CERVICAL SPINE FINDINGS

Alignment: Physiologic.

Vertebrae: No fracture, evidence of discitis, or bone lesion.

Cord: Increased T2 signal within the cord at the C3 level.

Posterior Fossa, vertebral arteries, paraspinal tissues: Negative.

Disc levels:

C2-3: Posterior disc protrusion causing mild mass effect on the cord
without significant spinal canal stenosis. Facet degenerative
changes resulting mild left neural foraminal narrowing.

C3-4: Left posterolateral disc protrusion causing moderate to severe
spinal canal stenosis with mass effect on the cord. Increased T2
signal is seen within the cord immediately above the level of
compression, likely edema. Uncovertebral and facet degenerative
changes resulting in moderate right and severe left neural foraminal
narrowing.

C4-5: Posterior disc protrusion asymmetric to the left causing mass
effect on the cord and resulting in moderate spinal canal stenosis.
Uncovertebral and facet degenerative changes resulting in severe
bilateral neural foraminal narrowing.

C5-6: Posterior disc osteophyte complex causing mass effect on the
cord and resulting in moderate spinal canal stenosis. Uncovertebral
and facet degenerative changes resulting severe bilateral neural
foraminal narrowing.

C6-7: Left posterolateral disc protrusion causing indentation of the
thecal sac and resulting mild spinal canal stenosis. Uncovertebral
and facet degenerative changes resulting in mild bilateral neural
foraminal narrowing.

C7-T1: Left posterolateral disc protrusion causing indentation on
the thecal sac and resulting in mild left neural foraminal
narrowing. No significant spinal canal stenosis.

MRI THORACIC SPINE FINDINGS

Alignment:  Physiologic.

Vertebrae: No fracture, evidence of discitis, or bone lesion.
Postsurgical changes from T3-4, T5-7 and T9-10 laminectomies.
Interval resolution of the large posterior epidural fluid
collection.

Cord: Central increased T2 signal within the cord at T6-7, T7-8 and
from the T8-9 level to the conus medullaris.

Paraspinal and other soft tissues: Small bilateral pleural effusion.
A 1.5 cm T2 hyperintense hepatic lesion.

Disc levels:

Tiny posterior disc protrusions at T3-4, T5-6, T6-7 and T7-8 causing
minimal indentation on the thecal sac. No significant spinal canal
or neural foraminal stenosis at any thoracic level.

MRI LUMBAR SPINE FINDINGS

Segmentation:  Standard.

Alignment:  Physiologic.

Vertebrae: No fracture, evidence of discitis, or bone lesion.
Endplate degenerative changes at L5-S1.

Conus medullaris and cauda equina: Conus extends to the L1-2 level.
Increased T2 signal within the conus medullaris, as described above.
There is clumping of the roots of the cauda equina, particularly at
L2-3 suggesting adhesive arachnoiditis.

Paraspinal and other soft tissues: Negative.

Disc levels:

T12-L1: No spinal canal or neural foraminal stenosis.

L1-2: No spinal canal or neural foraminal stenosis.

L2-3: Shallow disc bulge and moderate facet degenerative changes
without significant spinal canal or neural foraminal stenosis.

L3-4: Moderate facet degenerative changes. No spinal canal or neural
foraminal stenosis.

L4-5: Mild loss of disc height, disc bulge and moderate facet
degenerative changes without significant spinal canal or neural
foraminal stenosis.

L5-S1: Loss of disc height, disc bulge with superimposed left
central/subarticular disc protrusion and moderate facet degenerative
changes. Findings result in narrowing of the left subarticular zone
with displacement of the traversing left S1 nerve root. No
significant neural foraminal narrowing.
IMPRESSION: 1. The study is degraded by motion, particularly the postcontrast
images.
2. Interval resolution of the large posterior thoracic epidural
fluid collection with postsurgical changes from multilevel
laminectomy noted in the thoracic spine. No new fluid collection
identified.
3. Increased T2 signal within the cord centrally at T6-7, T7-8 and
from the T8-9 level to the conus medullaris without cord expansion.
This may represent post ischemic changes related to now resolved
cord compression.
4. Small focus of increased T2 signal within the cord at the C3
level it is likely related to compressive myelopathy.
5. Advanced degenerative changes of the cervical spine, with
moderate to severe spinal canal stenosis at C3-4 and moderate at
C4-5 and C5-6.
6. Multilevel high-grade neural foraminal narrowing at the cervical
spine.
7. Mild degenerative changes of the thoracic spine without spinal
canal or neural foraminal stenosis.
8. Degenerative disc disease at L5-S1 with narrowing of the left
subarticular zone and likely impingement of the traversing left S1
nerve root.

## 2020-11-28 MED ORDER — INSULIN ASPART 100 UNIT/ML IJ SOLN
0.0000 [IU] | Freq: Three times a day (TID) | INTRAMUSCULAR | Status: DC
  Administered 2020-11-28: 3 [IU] via SUBCUTANEOUS
  Administered 2020-11-29: 5 [IU] via SUBCUTANEOUS
  Administered 2020-11-29: 3 [IU] via SUBCUTANEOUS
  Administered 2020-11-30: 08:00:00 2 [IU] via SUBCUTANEOUS
  Administered 2020-11-30 (×2): 3 [IU] via SUBCUTANEOUS
  Administered 2020-12-01: 5 [IU] via SUBCUTANEOUS
  Administered 2020-12-01 (×2): 2 [IU] via SUBCUTANEOUS
  Administered 2020-12-02: 3 [IU] via SUBCUTANEOUS
  Administered 2020-12-02: 2 [IU] via SUBCUTANEOUS
  Administered 2020-12-03 – 2020-12-04 (×2): 3 [IU] via SUBCUTANEOUS
  Administered 2020-12-05: 2 [IU] via SUBCUTANEOUS
  Administered 2020-12-06: 09:00:00 5 [IU] via SUBCUTANEOUS
  Administered 2020-12-06 – 2020-12-08 (×8): 15 [IU] via SUBCUTANEOUS
  Administered 2020-12-09 (×3): 5 [IU] via SUBCUTANEOUS
  Administered 2020-12-10: 2 [IU] via SUBCUTANEOUS
  Administered 2020-12-11: 17:00:00 3 [IU] via SUBCUTANEOUS
  Administered 2020-12-12: 2 [IU] via SUBCUTANEOUS
  Administered 2020-12-12: 19:00:00 11 [IU] via SUBCUTANEOUS
  Filled 2020-11-28 (×29): qty 1

## 2020-11-28 MED ORDER — SODIUM CHLORIDE 0.9% IV SOLUTION: Freq: Once | INTRAVENOUS | Status: AC

## 2020-11-28 MED ORDER — GADOBUTROL 1 MMOL/ML IV SOLN
8.0000 mL | Freq: Once | INTRAVENOUS | Status: AC | PRN
  Administered 2020-11-28: 14:00:00 8 mL via INTRAVENOUS

## 2020-11-28 MED ORDER — INSULIN GLARGINE-LIXISENATIDE 100-33 UNT-MCG/ML ~~LOC~~ SOPN: 10.0000 [IU] | PEN_INJECTOR | Freq: Every day | SUBCUTANEOUS | Status: DC

## 2020-11-28 MED ORDER — SODIUM CHLORIDE 0.9 % IV SOLN: INTRAVENOUS | Status: DC

## 2020-11-28 MED ORDER — INSULIN GLARGINE-YFGN 100 UNIT/ML ~~LOC~~ SOLN
10.0000 [IU] | Freq: Every day | SUBCUTANEOUS | Status: DC
  Administered 2020-11-28 – 2020-12-03 (×6): 10 [IU] via SUBCUTANEOUS
  Filled 2020-11-28 (×7): qty 0.1

## 2020-11-28 MED ORDER — POTASSIUM CHLORIDE CRYS ER 20 MEQ PO TBCR
40.0000 meq | EXTENDED_RELEASE_TABLET | Freq: Once | ORAL | Status: AC
  Administered 2020-11-28: 12:00:00 40 meq via ORAL
  Filled 2020-11-28: qty 2

## 2020-11-28 MED ORDER — MAGNESIUM SULFATE 4 GM/100ML IV SOLN
4.0000 g | Freq: Once | INTRAVENOUS | Status: AC
  Administered 2020-11-28: 4 g via INTRAVENOUS
  Filled 2020-11-28: qty 100

## 2020-11-28 NOTE — Progress Notes (Signed)
Physical Therapy Treatment Patient Details Name: Tonya Myers MRN: [id number] DOB: [birth year] Today's Date: 11/28/2020    History of Present Illness Pt admitted to [facility] on 11/19/20 for decreased appetite, vomiting, AMS, and lethargy. On EMS assessment, pt was found to have fever 102 and AMS with confusion and left sided weakness.  Found to have pancytopenia, oncology was consulted due to hx of CLL, but so far no concern of CLL relapse. ID also on board, initially concerning for meningitis due to hx of pneumococcal meningitis. PMH significant for: DDD, cervical radiculopathy, anxiety, DM, HTN, hypothyroidism, CLL, asthma, cLBP, vertigo, and hx of stroke.    PT Comments    Pt tolerated treatment well today. Treatment continues to focus on strengthening, proximal stability, activity tolerance, and overall safety with functional mobility. Pt able to improve assist levels, activity tolerance, and standing balance from last session. Required supervision for bed mobility, mod-max assist for transfers, and mod assist for limited gait at bedside with RW. Able to perform seated BLE therex at bedside as well as sustained thoracic extension and multiplanar reaching for proximal stability/strengthening. Ted hose donned and BLE elevated at end of session to address LE edema. Improved motivation for participation noted today. Required increased seated rest breaks due to elevated HR (120bpm) and low Hgb (7.1). Pt will continue to benefit from skilled acute PT services to address deficits for return to baseline function. Will continue to recommend [name] at DC.  Of note, pt frequently changing her mind regarding DC recommendations. Unknown if due to cognitive deficit. However, with encouragement and education from therapy staff, pt becomes agreeable for [name] rec.     Follow Up Recommendations  [name]     Equipment Recommendations  Hospital bed (in process of getting new hospital bed.)    Recommendations for Other  Services       Precautions / Restrictions Precautions Precautions: Fall Restrictions Weight Bearing Restrictions: No    Mobility  Bed Mobility Overal bed mobility: Needs Assistance Bed Mobility: Supine to Sit;Sit to Supine     Supine to sit: Supervision Sit to supine: Supervision   General bed mobility comments: Supervision for supine<>sit transfer, requiring increased time/effort and utilizing momentum to facilitate mobility    Transfers Overall transfer level: Needs assistance Equipment used: Rolling walker (2 wheeled) Transfers: Sit to/from Stand Sit to Stand: Mod assist;Max assist         General transfer comment: Intermittent assist ranging from mod-max assist for power to stand from EOB with RW; multimodal cues for safety/sequencing, increased time/effor to achieve full upright posture  Ambulation/Gait Ambulation/Gait assistance: Mod assist Gait Distance (Feet): 2 Feet (4 laterl steps towards HOB) Assistive device: Rolling walker (2 wheeled)       General Gait Details: Mod-max assist to take a few steps towards [facility] with RW. Pt required max multimodal cues for RW proximity/negotiation, balance, and LE sequencing. Pt demonstrates wide BOS, poor balance, slowed cadence, impaired motor planning/control/grading, R genu recurvatum with R stance, and decreased step length/foot clearance bil.     Balance Overall balance assessment: Needs assistance Sitting-balance support: No upper extremity supported;Feet supported Sitting balance-Leahy Scale: Good     Standing balance support: Bilateral upper extremity supported;During functional activity Standing balance-Leahy Scale: Poor Standing balance comment: Required mod assist due to balance/impulsivity/poor safety awareness       Cognition Arousal/Alertness: Awake/alert Behavior During Therapy: WFL for tasks assessed/performed Overall Cognitive Status: Impaired/Different from baseline         General Comments:  Able to  follow 2-step commands with increased time for processing of task      Exercises General Exercises - Lower Extremity Long Arc Quad: AROM;Both;10 reps Heel Slides: AROM;Both;10 reps Hip ABduction/ADduction: AROM;Both;10 reps Hip Flexion/Marching: AROM;Both;10 reps Toe Raises: AROM;Both;10 reps Heel Raises: AROM;Both;10 reps Other Exercises Other Exercises: bed mobility, multiple STS transfers, and limited gait with RW. Continues to require mod-max assist for transfers/gait, with multiple rest breaks due to elevated HR. Other Exercises: Pt able to perform x10 reps of BLE therex, multiplanar reaching outside BOS, and seated thoracic ext (5x10s, 5x15s). Other Exercises: Pt and spouse educated: PT role/POC, DC rec, benefits of [name] for return to PLOF, ted hose benefits, benefits of participation with therapy, safety with mobility.    General Comments General comments (skin integrity, edema, etc.): HR up to 120bpm with minimal mobility      Pertinent Vitals/Pain Pain Assessment: No/denies pain     PT Goals (current goals can now be found in the care plan section) Acute Rehab PT Goals Patient Stated Goal: to get stronger PT Goal Formulation: With patient/family Time For Goal Achievement: 12/10/20 Potential to Achieve Goals: Fair    Frequency    7X/week      PT Plan Current plan remains appropriate       AM-PAC PT "6 Clicks" Mobility   Outcome Measure  Help needed turning from your back to your side while in a flat bed without using bedrails?: A Little Help needed moving from lying on your back to sitting on the side of a flat bed without using bedrails?: A Lot Help needed moving to and from a bed to a chair (including a wheelchair)?: A Lot Help needed standing up from a chair using your arms (e.g., wheelchair or bedside chair)?: A Lot Help needed to walk in hospital room?: Total Help needed climbing 3-5 steps with a railing? : Total 6 Click Score: 11    End of  Session Equipment Utilized During Treatment: Gait belt Activity Tolerance: Patient limited by fatigue;Patient tolerated treatment well Patient left: in bed;with bed alarm set;with family/visitor present;with call bell/phone within reach (bed in chair position, ted hose donned) Nurse Communication: Mobility status PT Visit Diagnosis: Unsteadiness on feet (R26.81);Muscle weakness (generalized) (M62.81);Difficulty in walking, not elsewhere classified (R26.2);Hemiplegia and hemiparesis Hemiplegia - Right/Left: Right     Time: 2353-6144 PT Time Calculation (min) (ACUTE ONLY): 44 min  Charges:  $Therapeutic Exercise: 8-22 mins $Therapeutic Activity: 8-22 mins $Neuromuscular Re-education: 8-22 mins                        Herminio Commons, PT, DPT 12:22 PM,11/28/20

## 2020-11-28 NOTE — Progress Notes (Signed)
PROGRESS NOTE    Tonya Myers  LZJ:[id number] DOB: [birth year] DOA: 11/19/2020 PCP: [name], MD   Brief Narrative:  55 y.o. female with medical history significant for recent pneumococcal meningitis and bacteremia, depression, hypothyroid, IDDM2, presents to the ED for chief concern of altered mentation.   At bedside, she had expressive aphasia, right upper and lower extremity weakness. Confusion today per spouse. Can walk with a walker and assistance. Jumbled words. She has baseline weakness in the right arm compared to left arm.    Spouse reports that patient had poor PO intake for more than a week. Spouse endorses vomiting and dnies blood and black coffee ground substances.   Patient has been unable to tolerate the MRI due to severe claustrophobia.  Will attempt again with help of IV Ativan.  Lumbar puncture was performed, initial results not terribly impressive.  Not overtly concerning for meningitis.  Remains on broad-spectrum IV antibiotics and IV acyclovir.  Infectious disease consulted.  Concern for possible manifestation of underlying hematologic malignancy.  I have requested consultation from oncology.  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  She sees oncology only once yearly.  Patient was evaluated by oncology.  Very unlikely that current presentation is a manifestation of underlying CLL.  No clear infectious signs.  All cultures remain negative.  LP is inconsistent with meningitis.  CT head chest abdomen pelvis significant surprisingly for pancreatitis.  Lipase is elevated.  Patient has been responding to treatment for pancreatitis including bowel rest and IV fluids.  Oral thrush is responding to treatment.  Mental status remains poor.  Appetite remains poor.  When inquired about why she is not eating she denies throat or abdominal pain and states just poor appetite.  8/2: Patient remains altered and encephalopathic.  Etiology at this point has  been unclear.  I requested consultation from neurology.  Possible vitamin deficiency.  Recommending high-dose IV thiamine.     Assessment & Plan:   Principal Problem:   Subacute delirium Active Problems:   DDD (degenerative disc disease), cervical   Aphasia   Anemia of chronic disease   Acute focal neurological deficit   Encephalitis   Altered mental status   Encephalopathy acute   Fever Hx of epidural and paraspinal abscess --pt had fever up to 103 overnight. --UA, repeat covid neg.  No cough or dyspnea to suggest PNA.  Procal increased. Plan: --MRI brain, spine today, no obvious infection  Acute pancreatitis Unclear etiology No reported history of alcohol intake, no radiographic evidence of gallstones Possible drug-induced however unclear exactly which drug could have driven this Initial abdominal examination negative for tenderness On interview patient does endorse left upper quadrant tenderness Lipase downtrending, but up again today. Triglycerides negative Plan: --NS@100  for 10 hours  Acute toxic metabolic encephalopathy Unclear etiology --meningitis ruled out by LP.  CT head neg for new stroke.  Hematology ruled out CLL as the culprit.  No seizure on EEG. --mental status dramatically improved with treatment of pancreatitis and IV thiamine repletion. Plan: --MRI brain today, no acute finding --cont thiamine  Pancytopenia History of CLL All 3 cell lines acutely depressed Oncology consulted Patient's CLL has been in remission since 2016 Recurrence of hematologic note malignancy felt unlikely Plan: --1u pRBC today for Hgb 7.1  AKI --Cr up to 1.35 this morning, baseline ~0.7.  Likely due to poor oral hydration.  Urine appeared very dark. Plan: --NS@100  for 10 hours  Marked hypokalemia --monitor and replete PRN  Hypomag --replete with IV mag  Mouth pain and Oral thrush, resolved Patient with clinical indications of thrush Associated with difficulty  swallowing Seems to be improving Patient states poor p.o. intake is driven by appetite rather than pain --s/p Diflucan for 3 doses Plan: --d/c nystatin swish and swallow  Insulin-dependent diabetes mellitus Hypoglycemic episodes Patient is on long-acting formulation 50 units daily at home in addition to metformin --A1c 6.5  --hypoglycemia due to q4h SSI Plan: --resume long-acting insulin at 10u daily --SSI TID  Hyperlipidemia --cont statin  Hypothyroid PTA Synthroid  HTN --cont cardizem and hydralazine   DVT prophylaxis: TED hose Code Status: Full code Family Communication: husband updated at bedside today.  Doctor family member updated on the phone today.  Disposition Plan: Status is: Inpatient  Remains inpatient appropriate because:Inpatient level of care appropriate due to severity of illness  Dispo: The patient is from: Home              Anticipated d/c is to: [name]              Patient currently is not medically stable to d/c.   Difficult to place patient No   Level of care: Med-Surg  Consultants:  ID Oncology Neurology  Procedures:  Lumbar puncture 7/28  Antimicrobials:    Subjective: Pt denied abdominal pain, back pain, dyspnea.    Pt again refused [name], but after PT talked with her, she again agreed to it.    MRI brain and spine done today, no obvious infection.   Objective: Vitals:   11/28/20 0408 11/28/20 0714 11/28/20 1124 11/28/20 1605  BP: 136/72 135/67 115/74 114/64  Pulse: 80 82 (!) 101 90  Resp: 18 18 20 18   Temp: 99.7 F (37.6 C) 98.3 F (36.8 C) 99.4 F (37.4 C) 98.2 F (36.8 C)  TempSrc: Oral Oral    SpO2: 96% 97% 100% 99%  Weight:      Height:        Intake/Output Summary (Last 24 hours) at 11/28/2020 1641 Last data filed at 11/28/2020 1008 Gross per 24 hour  Intake 0 ml  Output 250 ml  Net -250 ml   Filed Weights   11/20/20 0507  Weight: 83 kg    Examination:  Constitutional: NAD, AAOx3 HEENT: conjunctivae and  lids normal, EOMI CV: No cyanosis.   RESP: normal respiratory effort, on RA Extremities: No effusions, edema in BLE SKIN: warm, dry Neuro: II - XII grossly intact.   Psych: grouchy mood and affect.     Data Reviewed: I have personally reviewed following labs and imaging studies  CBC: Recent Labs  Lab 11/22/20 0650 11/23/20 0556 11/24/20 0440 11/25/20 0611 11/26/20 0451 11/27/20 0458 11/28/20 0455  WBC 8.0 10.9* 10.0 8.7 7.2 5.8 4.1  NEUTROABS 6.2 8.6* 7.9* 7.3 5.8  --   --   HGB 8.7* 9.4* 9.7* 9.3* 7.8* 8.2* 7.1*  HCT 26.8* 29.4* 30.4* 28.4* 24.4* 25.8* 22.4*  MCV 82.2 82.4 81.3 83.0 83.3 84.0 83.9  PLT 75* 67* 59* 65* 75* 90* 87*   Basic Metabolic Panel: Recent Labs  Lab 11/23/20 0556 11/24/20 0440 11/26/20 0451 11/27/20 0458 11/28/20 0455  NA 136 135 135 136 136  K 4.4 4.7 3.7 3.5 3.4*  CL 101 106 107 106 108  CO2 29 26 24 24 25   GLUCOSE 206* 91 68* 84 165*  BUN 9 8 9 8 12   CREATININE 0.51 0.56 0.89 1.00 1.35*  CALCIUM 8.1* 7.9* 7.5* 7.8* 7.5*  MG  --   --  1.5* 1.5* 1.6*  PHOS  --   --  3.2 3.1 3.2   GFR: Estimated Creatinine Clearance: 53.7 mL/min (A) (by C-G formula based on SCr of 1.35 mg/dL (H)). Liver Function Tests: Recent Labs  Lab 11/22/20 0650 11/23/20 0556 11/24/20 0440 11/28/20 0455  AST 33 28 23 45*  ALT <5 5 <5 6  ALKPHOS 52 58 58 71  BILITOT 0.7 0.9 0.7 0.4  PROT 6.3* 6.3* 6.1* 5.3*  ALBUMIN 2.5* 2.5* 2.5* 2.0*   Recent Labs  Lab 11/24/20 0440 11/25/20 0611 11/26/20 0451 11/27/20 0458 11/28/20 0455  LIPASE 305* 325* 372* 305* 387*   No results for input(s): AMMONIA in the last 168 hours.  Coagulation Profile: No results for input(s): INR, PROTIME in the last 168 hours.  Cardiac Enzymes: No results for input(s): CKTOTAL, CKMB, CKMBINDEX, TROPONINI in the last 168 hours. BNP (last 3 results) No results for input(s): PROBNP in the last 8760 hours. HbA1C: No results for input(s): HGBA1C in the last 72 hours. CBG: Recent  Labs  Lab 11/27/20 2316 11/28/20 0413 11/28/20 0759 11/28/20 1125 11/28/20 1606  GLUCAP 303* 161* 83 238* 152*   Lipid Profile: No results for input(s): CHOL, HDL, LDLCALC, TRIG, CHOLHDL, LDLDIRECT in the last 72 hours.  Thyroid Function Tests: No results for input(s): TSH, T4TOTAL, FREET4, T3FREE, THYROIDAB in the last 72 hours.   Anemia Panel: No results for input(s): VITAMINB12, FOLATE, FERRITIN, TIBC, IRON, RETICCTPCT in the last 72 hours.  Sepsis Labs: Recent Labs  Lab 11/27/20 0458 11/28/20 0455  PROCALCITON 1.79 2.07    Recent Results (from the past 240 hour(s))  Blood culture (routine x 2)     Status: None   Collection Time: 11/19/20  4:47 PM   Specimen: BLOOD  Result Value Ref Range Status   Specimen Description BLOOD RIGHT ANTECUBITAL  Final   Special Requests   Final    BOTTLES DRAWN AEROBIC AND ANAEROBIC Blood Culture adequate volume   Culture   Final    NO GROWTH 5 DAYS Performed at [facility], [address] [place], [place] [address]    Report Status 11/24/2020 FINAL  Final  Resp Panel by RT-PCR (Flu A&B, Covid) Nasopharyngeal Swab     Status: None   Collection Time: 11/19/20  4:48 PM   Specimen: Nasopharyngeal Swab; Nasopharyngeal(NP) swabs in vial transport medium  Result Value Ref Range Status   SARS Coronavirus 2 by RT PCR NEGATIVE NEGATIVE Final    Comment: (NOTE) SARS-CoV-2 target nucleic acids are NOT DETECTED.  The SARS-CoV-2 RNA is generally detectable in upper respiratory specimens during the acute phase of infection. The lowest concentration of SARS-CoV-2 viral copies this assay can detect is 138 copies/mL. A negative result does not preclude SARS-Cov-2 infection and should not be used as the sole basis for treatment or other patient management decisions. A negative result may occur with  improper specimen collection/handling, submission of specimen other than nasopharyngeal swab, presence of viral mutation(s) within  the areas targeted by this assay, and inadequate number of viral copies(<138 copies/mL). A negative result must be combined with clinical observations, patient history, and epidemiological information. The expected result is Negative.  Fact Sheet for Patients:  EntrepreneurPulse.com.au  Fact Sheet for Healthcare Providers:  IncredibleEmployment.be  This test is no t yet approved or cleared by the [place] FDA and  has been authorized for detection and/or diagnosis of SARS-CoV-2 by FDA under an Emergency Use Authorization (EUA). This EUA will remain  in effect (  meaning this test can be used) for the duration of the COVID-19 declaration under Section 564(b)(1) of the Act, 21 U.S.C.section 360bbb-3(b)(1), unless the authorization is terminated  or revoked sooner.       Influenza A by PCR NEGATIVE NEGATIVE Final   Influenza B by PCR NEGATIVE NEGATIVE Final    Comment: (NOTE) The Xpert Xpress SARS-CoV-2/FLU/RSV plus assay is intended as an aid in the diagnosis of influenza from Nasopharyngeal swab specimens and should not be used as a sole basis for treatment. Nasal washings and aspirates are unacceptable for Xpert Xpress SARS-CoV-2/FLU/RSV testing.  Fact Sheet for Patients: EntrepreneurPulse.com.au  Fact Sheet for Healthcare Providers: IncredibleEmployment.be  This test is not yet approved or cleared by the [place] FDA and has been authorized for detection and/or diagnosis of SARS-CoV-2 by FDA under an Emergency Use Authorization (EUA). This EUA will remain in effect (meaning this test can be used) for the duration of the COVID-19 declaration under Section 564(b)(1) of the Act, 21 U.S.C. section 360bbb-3(b)(1), unless the authorization is terminated or revoked.  Performed at [facility], [address] [place], [place] [address]   Blood culture (routine x 2)     Status: None    Collection Time: 11/19/20  6:46 PM   Specimen: BLOOD  Result Value Ref Range Status   Specimen Description BLOOD RIGHT ANTECUBITAL  Final   Special Requests   Final    BOTTLES DRAWN AEROBIC AND ANAEROBIC Blood Culture adequate volume   Culture   Final    NO GROWTH 5 DAYS Performed at [facility], [address], [place], [place] [address]    Report Status 11/24/2020 FINAL  Final  Urine Culture     Status: Abnormal   Collection Time: 11/19/20  9:33 PM   Specimen: Urine, Clean Catch  Result Value Ref Range Status   Specimen Description   Final    URINE, CLEAN CATCH Performed at [facility], [address] [place], [place] [address]    Special Requests   Final    NONE Performed at [facility], [address], [place], [place] [address]    Culture (A)  Final    <10,000 COLONIES/mL INSIGNIFICANT GROWTH Performed at [facility], [place] [address], [place], [place] [address]    Report Status 11/21/2020 FINAL  Final  Culture, fungus without smear     Status: None (Preliminary result)   Collection Time: 11/20/20 11:09 AM   Specimen: CSF; Cerebrospinal Fluid  Result Value Ref Range Status   Specimen Description   Final    CSF Performed at [facility], [address] [place], [place] [address]    Special Requests   Final    NONE Performed at [facility], [address] [place], [place] [address]    Culture   Final    NO FUNGUS ISOLATED AFTER 8 DAYS Performed at [facility], [place] [address], [place], [place] [address]    Report Status PENDING  Incomplete  CSF culture w Gram Stain     Status: None   Collection Time: 11/20/20 11:09 AM   Specimen: PATH Cytology CSF; Cerebrospinal Fluid  Result Value Ref Range Status   Specimen Description   Final    CSF Performed at [facility] [address], [place], [place] [address]    Special Requests   Final    NONE Performed at [facility], [address], [place], [place] [address]  Gram Stain   Final    NO ORGANISMS SEEN WBC SEEN RED BLOOD CELLS PRESENT Performed at [facility], [address] [place], [place] [address]    Culture   Final    NO GROWTH 3 DAYS Performed at [facility], [place] [address], [place], [place] [address]    Report Status 11/23/2020 FINAL  Final  Fungus Culture With Stain     Status: None (Preliminary result)   Collection Time: 11/20/20 11:09 AM   Specimen: PATH Cytology CSF; Cerebrospinal Fluid  Result Value Ref Range Status   Fungus Stain Final report  Final    Comment: (NOTE) Performed At: [facility] [place], [place] [id number] Rush Farmer MD QI:[id number]    Fungus (Mycology) Culture PENDING  Incomplete   Fungal Source CSF  Final    Comment: Performed at [facility], [address] [place], [place] [address]  Fungus Culture Result     Status: None   Collection Time: 11/20/20 11:09 AM  Result Value Ref Range Status   Result 1 Comment  Final    Comment: (NOTE) KOH/Calcofluor preparation:  no fungus observed. Performed At: [facility] [place], [place] [id number] Rush Farmer MD DJ:[id number]   Resp Panel by RT-PCR (Flu A&B, Covid) Nasopharyngeal Swab     Status: None   Collection Time: 11/27/20  1:39 PM   Specimen: Nasopharyngeal Swab; Nasopharyngeal(NP) swabs in vial transport medium  Result Value Ref Range Status   SARS Coronavirus 2 by RT PCR NEGATIVE NEGATIVE Final    Comment: (NOTE) SARS-CoV-2 target nucleic acids are NOT DETECTED.  The SARS-CoV-2 RNA is generally detectable in upper respiratory specimens during the acute phase of infection. The lowest concentration of SARS-CoV-2 viral copies this assay can detect is 138 copies/mL. A negative result does not preclude SARS-Cov-2 infection and should not be used as the sole basis for treatment or other patient management decisions. A negative  result may occur with  improper specimen collection/handling, submission of specimen other than nasopharyngeal swab, presence of viral mutation(s) within the areas targeted by this assay, and inadequate number of viral copies(<138 copies/mL). A negative result must be combined with clinical observations, patient history, and epidemiological information. The expected result is Negative.  Fact Sheet for Patients:  EntrepreneurPulse.com.au  Fact Sheet for Healthcare Providers:  IncredibleEmployment.be  This test is no t yet approved or cleared by the [place] FDA and  has been authorized for detection and/or diagnosis of SARS-CoV-2 by FDA under an Emergency Use Authorization (EUA). This EUA will remain  in effect (meaning this test can be used) for the duration of the COVID-19 declaration under Section 564(b)(1) of the Act, 21 U.S.C.section 360bbb-3(b)(1), unless the authorization is terminated  or revoked sooner.       Influenza A by PCR NEGATIVE NEGATIVE Final   Influenza B by PCR NEGATIVE NEGATIVE Final    Comment: (NOTE) The Xpert Xpress SARS-CoV-2/FLU/RSV plus assay is intended as an aid in the diagnosis of influenza from Nasopharyngeal swab specimens and should not be used as a sole basis for treatment. Nasal washings and aspirates are unacceptable for Xpert Xpress SARS-CoV-2/FLU/RSV testing.  Fact Sheet for Patients: EntrepreneurPulse.com.au  Fact Sheet for Healthcare Providers: IncredibleEmployment.be  This test is not yet approved or cleared by the [place] FDA and has been authorized for detection and/or diagnosis of SARS-CoV-2 by FDA under an Emergency Use Authorization (EUA). This EUA will remain in effect (meaning this test  can be used) for the duration of the COVID-19 declaration under Section 564(b)(1) of the Act, 21 U.S.C. section 360bbb-3(b)(1), unless the authorization is  terminated or revoked.  Performed at [facility], [address] [place], [place] [address]   CULTURE, BLOOD (ROUTINE X 2) w Reflex to ID Panel     Status: None (Preliminary result)   Collection Time: 11/27/20  8:23 PM   Specimen: BLOOD  Result Value Ref Range Status   Specimen Description BLOOD LAC  Final   Special Requests   Final    BOTTLES DRAWN AEROBIC AND ANAEROBIC Blood Culture results may not be optimal due to an inadequate volume of blood received in culture bottles   Culture   Final    NO GROWTH < 12 HOURS Performed at [facility], [address] [place], [place] [address]    Report Status PENDING  Incomplete  CULTURE, BLOOD (ROUTINE X 2) w Reflex to ID Panel     Status: None (Preliminary result)   Collection Time: 11/27/20  9:32 PM   Specimen: BLOOD LEFT HAND  Result Value Ref Range Status   Specimen Description BLOOD LEFT HAND  Final   Special Requests   Final    BOTTLES DRAWN AEROBIC AND ANAEROBIC Blood Culture adequate volume   Culture   Final    NO GROWTH < 12 HOURS Performed at [facility], [address] [place],  [address]    Report Status PENDING  Incomplete         Radiology Studies: MR BRAIN W WO CONTRAST  Result Date: 11/28/2020 CLINICAL DATA:  Mental status change, unknown cause; encephalopathy of unknown cause; recurrent fever. EXAM: MRI HEAD WITHOUT AND WITH CONTRAST TECHNIQUE: Multiplanar, multiecho pulse sequences of the brain and surrounding structures were obtained without and with intravenous contrast. CONTRAST:  67mL GADAVIST GADOBUTROL 1 MMOL/ML IV SOLN COMPARISON:  Head CT November 21, 2020 FINDINGS: Brain: Bilateral T2 hyperintense subdural fluid collections measuring up to 5 mm on the right and 6 mm on the left with mild mass effect on the adjacent brain parenchyma without midline shift. Postcontrast images are severely degraded by motion. Mild diffuse pachymeningeal contrast enhancement seen. The brain  parenchyma has normal morphology and signal characteristics. No acute infarction, hemorrhage, hydrocephalus, or mass lesion. Vascular: Normal flow voids. Skull and upper cervical spine: Normal marrow signal. Sinuses/Orbits: Negative. Other: None. IMPRESSION: Interval development of small bilateral subdural fluid collections, may be related to recent lumbar puncture. Electronically Signed   By: Pedro Earls M.D.   On: 11/28/2020 14:54   MR CERVICAL SPINE W WO CONTRAST  Result Date: 11/28/2020 CLINICAL DATA:  History of epidural abscess and paraspinal abscess in the setting of recurrent fever and altered mental status. EXAM: MRI CERVICAL, THORACIC AND LUMBAR SPINE WITHOUT AND WITH CONTRAST TECHNIQUE: Multiplanar and multiecho pulse sequences of the cervical spine, to include the craniocervical junction and cervicothoracic junction, and thoracic and lumbar spine, were obtained without and with intravenous contrast. CONTRAST:  27mL GADAVIST GADOBUTROL 1 MMOL/ML IV SOLN COMPARISON:  MRI of the cervical, thoracic and lumbar spine June 17, 2020. FINDINGS: The study is partially degraded by motion. Postcontrast axial images are essentially nondiagnostic. MRI CERVICAL SPINE FINDINGS Alignment: Physiologic. Vertebrae: No fracture, evidence of discitis, or bone lesion. Cord: Increased T2 signal within the cord at the C3 level. Posterior Fossa, vertebral arteries, paraspinal tissues: Negative. Disc levels: C2-3: Posterior disc protrusion causing mild mass effect on the cord without significant spinal  canal stenosis. Facet degenerative changes resulting mild left neural foraminal narrowing. C3-4: Left posterolateral disc protrusion causing moderate to severe spinal canal stenosis with mass effect on the cord. Increased T2 signal is seen within the cord immediately above the level of compression, likely edema. Uncovertebral and facet degenerative changes resulting in moderate right and severe left neural  foraminal narrowing. C4-5: Posterior disc protrusion asymmetric to the left causing mass effect on the cord and resulting in moderate spinal canal stenosis. Uncovertebral and facet degenerative changes resulting in severe bilateral neural foraminal narrowing. C5-6: Posterior disc osteophyte complex causing mass effect on the cord and resulting in moderate spinal canal stenosis. Uncovertebral and facet degenerative changes resulting severe bilateral neural foraminal narrowing. C6-7: Left posterolateral disc protrusion causing indentation of the thecal sac and resulting mild spinal canal stenosis. Uncovertebral and facet degenerative changes resulting in mild bilateral neural foraminal narrowing. C7-T1: Left posterolateral disc protrusion causing indentation on the thecal sac and resulting in mild left neural foraminal narrowing. No significant spinal canal stenosis. MRI THORACIC SPINE FINDINGS Alignment:  Physiologic. Vertebrae: No fracture, evidence of discitis, or bone lesion. Postsurgical changes from T3-4, T5-7 and T9-10 laminectomies. Interval resolution of the large posterior epidural fluid collection. Cord: Central increased T2 signal within the cord at T6-7, T7-8 and from the T8-9 level to the conus medullaris. Paraspinal and other soft tissues: Small bilateral pleural effusion. A 1.5 cm T2 hyperintense hepatic lesion. Disc levels: Tiny posterior disc protrusions at T3-4, T5-6, T6-7 and T7-8 causing minimal indentation on the thecal sac. No significant spinal canal or neural foraminal stenosis at any thoracic level. MRI LUMBAR SPINE FINDINGS Segmentation:  Standard. Alignment:  Physiologic. Vertebrae: No fracture, evidence of discitis, or bone lesion. Endplate degenerative changes at L5-S1. Conus medullaris and cauda equina: Conus extends to the L1-2 level. Increased T2 signal within the conus medullaris, as described above. 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 Paraspinal and other soft tissues: Negative. Disc levels: T12-L1: No spinal canal or neural foraminal stenosis. L1-2: No spinal canal or neural foraminal stenosis. L2-3: Shallow disc bulge and moderate facet degenerative changes without significant spinal canal or neural foraminal stenosis. L3-4: Moderate facet degenerative changes. No spinal canal or neural foraminal stenosis. L4-5: Mild loss of disc height, disc bulge and moderate facet degenerative changes without significant spinal canal or neural foraminal stenosis. L5-S1: Loss of disc height, disc bulge with superimposed left central/subarticular disc protrusion and moderate facet degenerative changes. Findings result in narrowing of the left subarticular zone with displacement of the traversing left S1 nerve root. No significant neural foraminal narrowing. IMPRESSION: 1. The study is degraded by motion, particularly the postcontrast images. 2. Interval resolution of the large posterior thoracic epidural fluid collection with postsurgical changes from multilevel laminectomy noted in the thoracic spine. No new fluid collection identified. 3. Increased T2 signal within the cord centrally at T6-7, T7-8 and from the T8-9 level to the conus medullaris without cord expansion. This may represent post ischemic changes related to now resolved cord compression. 4. 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 5. 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 6. Multilevel high-grade neural foraminal narrowing at the cervical spine. 7. Mild degenerative changes of the thoracic spine without spinal canal or neural foraminal stenosis. 8. Degenerative disc disease  at L5-S1 with narrowing of the left subarticular zone and likely impingement of the traversing left S1 nerve root. Electronically  Signed   By: Pedro Earls M.D.   On: 11/28/2020 15:41   MR THORACIC SPINE W WO CONTRAST  Result Date: 11/28/2020 CLINICAL DATA:  History of epidural abscess and paraspinal abscess in the setting of recurrent fever and altered mental status. EXAM: MRI CERVICAL, THORACIC AND LUMBAR SPINE WITHOUT AND WITH CONTRAST TECHNIQUE: Multiplanar and multiecho pulse sequences of the cervical spine, to include the craniocervical junction and cervicothoracic junction, and thoracic and lumbar spine, were obtained without and with intravenous contrast. CONTRAST:  69mL GADAVIST GADOBUTROL 1 MMOL/ML IV SOLN COMPARISON:  MRI of the cervical, thoracic and lumbar spine June 17, 2020. FINDINGS: The study is partially degraded by motion. Postcontrast axial images are essentially nondiagnostic. MRI CERVICAL SPINE FINDINGS Alignment: Physiologic. Vertebrae: No fracture, evidence of discitis, or bone lesion. Cord: Increased T2 signal within the cord at the C3 level. Posterior Fossa, vertebral arteries, paraspinal tissues: Negative. Disc levels: C2-3: Posterior disc protrusion causing mild mass effect on the cord without significant spinal canal stenosis. Facet degenerative changes resulting mild left neural foraminal narrowing. C3-4: Left posterolateral disc protrusion causing moderate to severe spinal canal stenosis with mass effect on the cord. Increased T2 signal is seen within the cord immediately above the level of compression, likely edema. Uncovertebral and facet degenerative changes resulting in moderate right and severe left neural foraminal narrowing. C4-5: Posterior disc protrusion asymmetric to the left causing mass effect on the cord and resulting in moderate spinal canal stenosis. Uncovertebral and facet degenerative changes resulting in severe bilateral neural foraminal narrowing. C5-6: Posterior disc osteophyte complex causing mass effect on the cord and resulting in moderate spinal canal stenosis.  Uncovertebral and facet degenerative changes resulting severe bilateral neural foraminal narrowing. C6-7: Left posterolateral disc protrusion causing indentation of the thecal sac and resulting mild spinal canal stenosis. Uncovertebral and facet degenerative changes resulting in mild bilateral neural foraminal narrowing. C7-T1: Left posterolateral disc protrusion causing indentation on the thecal sac and resulting in mild left neural foraminal narrowing. No significant spinal canal stenosis. MRI THORACIC SPINE FINDINGS Alignment:  Physiologic. Vertebrae: No fracture, evidence of discitis, or bone lesion. Postsurgical changes from T3-4, T5-7 and T9-10 laminectomies. Interval resolution of the large posterior epidural fluid collection. Cord: Central increased T2 signal within the cord at T6-7, T7-8 and from the T8-9 level to the conus medullaris. Paraspinal and other soft tissues: Small bilateral pleural effusion. A 1.5 cm T2 hyperintense hepatic lesion. Disc levels: Tiny posterior disc protrusions at T3-4, T5-6, T6-7 and T7-8 causing minimal indentation on the thecal sac. No significant spinal canal or neural foraminal stenosis at any thoracic level. MRI LUMBAR SPINE FINDINGS Segmentation:  Standard. Alignment:  Physiologic. Vertebrae: No fracture, evidence of discitis, or bone lesion. Endplate degenerative changes at L5-S1. Conus medullaris and cauda equina: Conus extends to the L1-2 level. Increased T2 signal within the conus medullaris, as described above. 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 Paraspinal and other soft tissues: Negative. Disc levels: T12-L1: No spinal canal or neural foraminal stenosis. L1-2: No spinal canal or neural foraminal stenosis. L2-3: Shallow disc bulge and moderate facet degenerative changes without significant spinal canal or neural foraminal stenosis. L3-4: Moderate facet degenerative changes. No spinal canal or  neural foraminal stenosis. L4-5: Mild loss of disc height, disc bulge and moderate facet degenerative changes without significant  spinal canal or neural foraminal stenosis. L5-S1: Loss of disc height, disc bulge with superimposed left central/subarticular disc protrusion and moderate facet degenerative changes. Findings result in narrowing of the left subarticular zone with displacement of the traversing left S1 nerve root. No significant neural foraminal narrowing. IMPRESSION: 1. The study is degraded by motion, particularly the postcontrast images. 2. Interval resolution of the large posterior thoracic epidural fluid collection with postsurgical changes from multilevel laminectomy noted in the thoracic spine. No new fluid collection identified. 3. Increased T2 signal within the cord centrally at T6-7, T7-8 and from the T8-9 level to the conus medullaris without cord expansion. This may represent post ischemic changes related to now resolved cord compression. 4. 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 5. 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 6. Multilevel high-grade neural foraminal narrowing at the cervical spine. 7. Mild degenerative changes of the thoracic spine without spinal canal or neural foraminal stenosis. 8. Degenerative disc disease at L5-S1 with narrowing of the left subarticular zone and likely impingement of the traversing left S1 nerve root. Electronically Signed   By: Pedro Earls M.D.   On: 11/28/2020 15:41   MR Lumbar Spine W Wo Contrast  Result Date: 11/28/2020 CLINICAL DATA:  History of epidural abscess and paraspinal abscess in the setting of recurrent fever and altered mental status. EXAM: MRI CERVICAL, THORACIC AND LUMBAR SPINE WITHOUT AND WITH CONTRAST TECHNIQUE: Multiplanar and multiecho pulse  sequences of the cervical spine, to include the craniocervical junction and cervicothoracic junction, and thoracic and lumbar spine, were obtained without and with intravenous contrast. CONTRAST:  55mL GADAVIST GADOBUTROL 1 MMOL/ML IV SOLN COMPARISON:  MRI of the cervical, thoracic and lumbar spine June 17, 2020. FINDINGS: The study is partially degraded by motion. Postcontrast axial images are essentially nondiagnostic. MRI CERVICAL SPINE FINDINGS Alignment: Physiologic. Vertebrae: No fracture, evidence of discitis, or bone lesion. Cord: Increased T2 signal within the cord at the C3 level. Posterior Fossa, vertebral arteries, paraspinal tissues: Negative. Disc levels: C2-3: Posterior disc protrusion causing mild mass effect on the cord without significant spinal canal stenosis. Facet degenerative changes resulting mild left neural foraminal narrowing. C3-4: Left posterolateral disc protrusion causing moderate to severe spinal canal stenosis with mass effect on the cord. Increased T2 signal is seen within the cord immediately above the level of compression, likely edema. Uncovertebral and facet degenerative changes resulting in moderate right and severe left neural foraminal narrowing. C4-5: Posterior disc protrusion asymmetric to the left causing mass effect on the cord and resulting in moderate spinal canal stenosis. Uncovertebral and facet degenerative changes resulting in severe bilateral neural foraminal narrowing. C5-6: Posterior disc osteophyte complex causing mass effect on the cord and resulting in moderate spinal canal stenosis. Uncovertebral and facet degenerative changes resulting severe bilateral neural foraminal narrowing. C6-7: Left posterolateral disc protrusion causing indentation of the thecal sac and resulting mild spinal canal stenosis. Uncovertebral and facet degenerative changes resulting in mild bilateral neural foraminal narrowing. C7-T1: Left posterolateral disc protrusion causing  indentation on the thecal sac and resulting in mild left neural foraminal narrowing. No significant spinal canal stenosis. MRI THORACIC SPINE FINDINGS Alignment:  Physiologic. Vertebrae: No fracture, evidence of discitis, or bone lesion. Postsurgical changes from T3-4, T5-7 and T9-10 laminectomies. Interval resolution of the large posterior epidural fluid collection. Cord: Central increased  T2 signal within the cord at T6-7, T7-8 and from the T8-9 level to the conus medullaris. Paraspinal and other soft tissues: Small bilateral pleural effusion. A 1.5 cm T2 hyperintense hepatic lesion. Disc levels: Tiny posterior disc protrusions at T3-4, T5-6, T6-7 and T7-8 causing minimal indentation on the thecal sac. No significant spinal canal or neural foraminal stenosis at any thoracic level. MRI LUMBAR SPINE FINDINGS Segmentation:  Standard. Alignment:  Physiologic. Vertebrae: No fracture, evidence of discitis, or bone lesion. Endplate degenerative changes at L5-S1. Conus medullaris and cauda equina: Conus extends to the L1-2 level. Increased T2 signal within the conus medullaris, as described above. 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 Paraspinal and other soft tissues: Negative. Disc levels: T12-L1: No spinal canal or neural foraminal stenosis. L1-2: No spinal canal or neural foraminal stenosis. L2-3: Shallow disc bulge and moderate facet degenerative changes without significant spinal canal or neural foraminal stenosis. L3-4: Moderate facet degenerative changes. No spinal canal or neural foraminal stenosis. L4-5: Mild loss of disc height, disc bulge and moderate facet degenerative changes without significant spinal canal or neural foraminal stenosis. L5-S1: Loss of disc height, disc bulge with superimposed left central/subarticular disc protrusion and moderate facet degenerative changes. Findings result in narrowing of the left subarticular zone with  displacement of the traversing left S1 nerve root. No significant neural foraminal narrowing. IMPRESSION: 1. The study is degraded by motion, particularly the postcontrast images. 2. Interval resolution of the large posterior thoracic epidural fluid collection with postsurgical changes from multilevel laminectomy noted in the thoracic spine. No new fluid collection identified. 3. Increased T2 signal within the cord centrally at T6-7, T7-8 and from the T8-9 level to the conus medullaris without cord expansion. This may represent post ischemic changes related to now resolved cord compression. 4. 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 5. 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 6. Multilevel high-grade neural foraminal narrowing at the cervical spine. 7. Mild degenerative changes of the thoracic spine without spinal canal or neural foraminal stenosis. 8. Degenerative disc disease at L5-S1 with narrowing of the left subarticular zone and likely impingement of the traversing left S1 nerve root. Electronically Signed   By: Pedro Earls M.D.   On: 11/28/2020 15:41   DG Chest Port 1 View  Result Date: 11/27/2020 CLINICAL DATA:  Fever and chest pain. EXAM: PORTABLE CHEST 1 VIEW COMPARISON:  And CT chest, abdomen and pelvis 11/21/2020. Single-view of the chest 10/01/2020. FINDINGS: Subtle micro nodules are seen in the right upper lobe. The left lung demonstrates minimal atelectasis in the base. Heart size is normal. No pneumothorax or pleural fluid. No acute or focal bony abnormality. IMPRESSION: Subtle focus of mild micronodularity in the right upper lobe compatible with residual infectious or inflammatory process as seen on prior CT. Recommend continued follow-up to clearing. Electronically Signed   By: Inge Rise M.D.   On:  11/27/2020 11:36   [place] EKG SITE RITE  Result Date: 11/27/2020 If Site Rite image not attached, placement could not be confirmed due to current cardiac rhythm.       Scheduled Meds:  sodium chloride   Intravenous Once   acetaminophen  1,000 mg Oral Once   Chlorhexidine Gluconate Cloth  6 each Topical Daily   diltiazem  120 mg Oral Daily   feeding supplement  1 Container Oral TID BM   hydrALAZINE  50 mg Oral Q8H   insulin aspart  0-15 Units Subcutaneous TID WC   insulin glargine-yfgn  10 Units Subcutaneous Daily   levothyroxine  100 mcg Oral QAC breakfast   magnesium oxide  800 mg Oral BID   mouth rinse  15 mL Mouth Rinse q12n4p   mirtazapine  15 mg Oral QHS   multivitamin with minerals  1 tablet Oral Daily   pantoprazole  40 mg Oral Daily   polyethylene glycol  34 g Oral BID   rosuvastatin  10 mg Oral QHS   sodium chloride flush  10-40 mL Intracatheter Q12H   [START ON 11/29/2020] thiamine  100 mg Oral Daily   Continuous Infusions:  sodium chloride 100 mL/hr at 11/28/20 1454      LOS: 8 days     Enzo Bi, MD [facility] Pager [phone]  If 7PM-7AM, please contact night-coverage 11/28/2020, 4:41 PM

## 2020-11-28 NOTE — Progress Notes (Signed)
OT Cancellation Note  Patient Details Name: Chivonne Rascon MRN: [id number] DOB: 1966-01-03   Cancelled Treatment:    Reason Eval/Treat Not Completed: Patient at procedure or test/ unavailable. Pt currently off the unit for MRI.OT will re-attempt when pt is next available.  Darleen Crocker, MS, OTR/L , CBIS ascom [phone]  11/28/20, 2:13 PM

## 2020-11-28 NOTE — Progress Notes (Signed)
ID Pt had MRI today Sedated for that No fever  Patient Vitals for the past 24 hrs:  BP Temp Temp src Pulse Resp SpO2  11/28/20 1124 115/74 99.4 F (37.4 C) -- (!) 101 20 100 %  11/28/20 0714 135/67 98.3 F (36.8 C) Oral 82 18 97 %  11/28/20 0408 136/72 99.7 F (37.6 C) Oral 80 18 96 %  11/27/20 2100 (!) 122/55 -- -- -- -- --  11/27/20 1939 (!) 102/59 98.8 F (37.1 C) Oral 98 20 100 %  11/27/20 1617 113/63 98.6 F (37 C) -- 90 20 96 %    Sleeping Chest B/l air entry [id number] Abd soft   LAbs CBC Latest Ref Rng & Units 11/28/2020 11/27/2020 11/26/2020  WBC 4.0 - 10.5 K/uL 4.1 5.8 7.2  Hemoglobin 12.0 - 15.0 g/dL 7.1(L) 8.2(L) 7.8(L)  Hematocrit 36.0 - 46.0 % 22.4(L) 25.8(L) 24.4(L)  Platelets 150 - 400 K/uL 87(L) 90(L) 75(L)     CMP Latest Ref Rng & Units 11/28/2020 11/27/2020 11/26/2020  Glucose 70 - 99 mg/dL 165(H) 84 68(L)  BUN 6 - 20 mg/dL 12 8 9   Creatinine 0.44 - 1.00 mg/dL 1.35(H) 1.00 0.89  Sodium 135 - 145 mmol/L 136 136 135  Potassium 3.5 - 5.1 mmol/L 3.4(L) 3.5 3.7  Chloride 98 - 111 mmol/L 108 106 107  CO2 22 - 32 mmol/L 25 24 24   Calcium 8.9 - 10.3 mg/dL 7.5(L) 7.8(L) 7.5(L)  Total Protein 6.5 - 8.1 g/dL 5.3(L) - -  Total Bilirubin 0.3 - 1.2 mg/dL 0.4 - -  Alkaline Phos 38 - 126 U/L 71 - -  AST 15 - 41 U/L 45(H) - -  ALT 0 - 44 U/L 6 - -    Imaging MRI brain Interval development of small bilateral subdural fluid collections,may be related to recent lumbar puncture.  MRI SPINE Interval resolution of the large posterior thoracic epidural fluid collection with postsurgical changes from multilevel laminectomy noted in the thoracic spine. No new fluid collection identified. Increased T2 signal within the cord centrally at T6-7, T7-8 and from the T8-9 level to the conus medullaris without cord expansion.This may represent post ischemic changes related to now resolved cord compression. Small focus of increased T2 signal within the cord at the C3 level it is likely  related to compressive myelopathy. 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  Multilevel high-grade neural foraminal narrowing at the cervical spine. Mild degenerative changes of the thoracic spine without spinal canal or neural foraminal stenosis.  Degenerative disc disease at L5-S1 with narrowing of the left subarticular zone and likely impingement of the traversing left S1 nerve root.  Impression/recommendation  Encephalopathy on admission' Much improved. 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

## 2020-11-29 DIAGNOSIS — R410 Disorientation, unspecified: Secondary | ICD-10-CM | POA: Diagnosis not present

## 2020-11-29 LAB — GLUCOSE, CAPILLARY
Glucose-Capillary: 72 mg/dL (ref 70–99)
Glucose-Capillary: 163 mg/dL — ABNORMAL HIGH (ref 70–99)
Glucose-Capillary: 217 mg/dL — ABNORMAL HIGH (ref 70–99)
Glucose-Capillary: 233 mg/dL — ABNORMAL HIGH (ref 70–99)
Glucose-Capillary: 209 mg/dL — ABNORMAL HIGH (ref 70–99)

## 2020-11-29 LAB — BASIC METABOLIC PANEL
Anion gap: 4 — ABNORMAL LOW (ref 5–15)
BUN: 12 mg/dL (ref 6–20)
CO2: 23 mmol/L (ref 22–32)
Calcium: 7.2 mg/dL — ABNORMAL LOW (ref 8.9–10.3)
Chloride: 110 mmol/L (ref 98–111)
Creatinine, Ser: 1.38 mg/dL — ABNORMAL HIGH (ref 0.44–1.00)
GFR, Estimated: 45 mL/min — ABNORMAL LOW (ref >60)
Glucose, Bld: 91 mg/dL (ref 70–99)
Potassium: 4 mmol/L (ref 3.5–5.1)
Sodium: 137 mmol/L (ref 135–145)

## 2020-11-29 LAB — CBC
HCT: 22.8 % — ABNORMAL LOW (ref 36.0–46.0)
Hemoglobin: 7.6 g/dL — ABNORMAL LOW (ref 12.0–15.0)
MCH: 27.5 pg (ref 26.0–34.0)
MCHC: 33.3 g/dL (ref 30.0–36.0)
MCV: 82.6 fL (ref 80.0–100.0)
Platelets: 79 10*3/uL — ABNORMAL LOW (ref 150–400)
RBC: 2.76 MIL/uL — ABNORMAL LOW (ref 3.87–5.11)
RDW: 17.1 % — ABNORMAL HIGH (ref 11.5–15.5)
WBC: 5.4 10*3/uL (ref 4.0–10.5)
nRBC: 0 % (ref 0.0–0.2)

## 2020-11-29 LAB — PROCALCITONIN: Procalcitonin: 1.8 ng/mL

## 2020-11-29 LAB — LIPASE, BLOOD: Lipase: 209 U/L — ABNORMAL HIGH (ref 11–51)

## 2020-11-29 LAB — MAGNESIUM: Magnesium: 2.4 mg/dL (ref 1.7–2.4)

## 2020-11-29 MED ORDER — SODIUM CHLORIDE 0.9 % IV SOLN: INTRAVENOUS | Status: AC

## 2020-11-29 NOTE — Progress Notes (Signed)
   11/29/20 1942  Assess: MEWS Score  Temp (!) 102.3 F (39.1 C)  BP (!) 146/73  Pulse Rate 94  Resp 17  SpO2 99 %  O2 Device Room Air  Assess: MEWS Score  MEWS Temp 2  MEWS Systolic 0  MEWS Pulse 0  MEWS RR 0  MEWS LOC 0  MEWS Score 2  MEWS Score Color Yellow  Assess: if the MEWS score is Yellow or Red  Were vital signs taken at a resting state? Yes  Focused Assessment No change from prior assessment  Does the patient meet 2 or more of the SIRS criteria? Yes  Does the patient have a confirmed or suspected source of infection? No  Provider and Rapid Response Notified? No  MEWS guidelines implemented *See Row Information* Yes  Treat  MEWS Interventions Administered scheduled meds/treatments  Take Vital Signs  Increase Vital Sign Frequency  Yellow: Q 2hr X 2 then Q 4hr X 2, if remains yellow, continue Q 4hrs  Escalate  MEWS: Escalate Yellow: discuss with charge nurse/RN and consider discussing with provider and RRT  Notify: Charge Nurse/RN  Name of Charge Nurse/RN Notified Jackie, RN  Date Charge Nurse/RN Notified 11/29/20  Time Charge Nurse/RN Notified 1950  Assess: SIRS CRITERIA  SIRS Temperature  1  SIRS Pulse 1  SIRS Respirations  0  SIRS WBC 0  SIRS Score Sum  2

## 2020-11-29 NOTE — Progress Notes (Signed)
PROGRESS NOTE    Tonya Myers  UYQ:[id number] DOB: 1965-06-15 DOA: 11/19/2020 PCP: [name], MD   Brief Narrative:  55 y.o. female with medical history significant for recent pneumococcal meningitis and bacteremia, depression, hypothyroid, IDDM2, presents to the ED for chief concern of altered mentation.   At bedside, she had expressive aphasia, right upper and lower extremity weakness. Confusion today per spouse. Can walk with a walker and assistance. Jumbled words. She has baseline weakness in the right arm compared to left arm.    Spouse reports that patient had poor PO intake for more than a week. Spouse endorses vomiting and dnies blood and black coffee ground substances.   Patient has been unable to tolerate the MRI due to severe claustrophobia.  Will attempt again with help of IV Ativan.  Lumbar puncture was performed, initial results not terribly impressive.  Not overtly concerning for meningitis.  Remains on broad-spectrum IV antibiotics and IV acyclovir.  Infectious disease consulted.  Concern for possible manifestation of underlying hematologic malignancy.  I have requested consultation from oncology.  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  She sees oncology only once yearly.  Patient was evaluated by oncology.  Very unlikely that current presentation is a manifestation of underlying CLL.  No clear infectious signs.  All cultures remain negative.  LP is inconsistent with meningitis.  CT head chest abdomen pelvis significant surprisingly for pancreatitis.  Lipase is elevated.  Patient has been responding to treatment for pancreatitis including bowel rest and IV fluids.  Oral thrush is responding to treatment.  Mental status remains poor.  Appetite remains poor.  When inquired about why she is not eating she denies throat or abdominal pain and states just poor appetite.  8/2: Patient remains altered and encephalopathic.  Etiology at this point has  been unclear.  I requested consultation from neurology.  Possible vitamin deficiency.  Recommending high-dose IV thiamine.   Assessment & Plan:   Principal Problem:   Subacute delirium Active Problems:   DDD (degenerative disc disease), cervical   Aphasia   Anemia of chronic disease   Acute focal neurological deficit   Encephalitis   Altered mental status   Encephalopathy acute   Fever, resolved Hx of epidural and paraspinal abscess --pt had fever up to 103 overnight. --UA, repeat covid neg.  No cough or dyspnea to suggest PNA.  Procal increased. --MRI brain, spine 8/5, no obvious infection --monitor for fever  Acute pancreatitis Unclear etiology No reported history of alcohol intake, no radiographic evidence of gallstones Possible drug-induced however unclear exactly which drug could have driven this Initial abdominal examination negative for tenderness On interview patient does endorse left upper quadrant tenderness Lipase trending down with IVF. Triglycerides negative Plan: --NS@100  for 10 hours again.  Acute toxic metabolic encephalopathy, resolved Unclear etiology --meningitis ruled out by LP.  CT head neg for new stroke.  Hematology ruled out CLL as the culprit.  No seizure on EEG. --mental status dramatically improved with treatment of pancreatitis and IV thiamine repletion. --MRI brain, no acute finding --cont thiamine  Pancytopenia History of CLL All 3 cell lines acutely depressed Oncology consulted Patient's CLL has been in remission since 2016 Recurrence of hematologic note malignancy felt unlikely --s/p 1u pRBC 8/5 for Hgb 7.1 Plan: --monitor Hgb and transfuse to keep Hgb >7  AKI --Cr up to 1.35 on 8/5, baseline ~0.7.  Likely due to poor oral hydration.  Urine appeared very dark. Plan: --NS@100  for  10 hours again.  Marked hypokalemia --monitor and replete PRN  Hypomag --replete with IV mag  Mouth pain and Oral thrush, resolved Patient with  clinical indications of thrush Associated with difficulty swallowing Seems to be improving Patient states poor p.o. intake is driven by appetite rather than pain --s/p Diflucan for 3 doses  Insulin-dependent diabetes mellitus Hypoglycemic episodes Patient is on long-acting formulation 50 units daily at home in addition to metformin --A1c 6.5  --hypoglycemia due to q4h SSI Plan: --cont long-acting insulin 10u daily --SSI TID  Hyperlipidemia --cont statin  Hypothyroid PTA Synthroid  HTN --cont cardizem and hydralazine   DVT prophylaxis: TED hose Code Status: Full code Family Communication: husband updated at bedside today.   Disposition Plan: Status is: Inpatient  Remains inpatient appropriate because:Inpatient level of care appropriate due to severity of illness  Dispo: The patient is from: Home              Anticipated d/c is to: Home (pt again refused [name]).              Patient currently is not medically stable to d/c.  AKI not improving.   Difficult to place patient No   Level of care: Med-Surg  Consultants:  ID Oncology Neurology  Procedures:  Lumbar puncture 7/28  Antimicrobials:    Subjective: Pt and husband reported she is having better oral intake.  Urine still very dark.  Cr not better.    Pt again refused [name].   Objective: Vitals:   11/28/20 2237 11/29/20 0619 11/29/20 0720 11/29/20 1131  BP: 127/80 128/78 134/68 120/65  Pulse: 88 85 85 97  Resp: 18 17 18 16   Temp: 97.6 F (36.4 C) 99.7 F (37.6 C) 98.7 F (37.1 C) 99 F (37.2 C)  TempSrc: Axillary Oral  Oral  SpO2: 97% 98% 96% 96%  Weight:      Height:        Intake/Output Summary (Last 24 hours) at 11/29/2020 1535 Last data filed at 11/29/2020 1513 Gross per 24 hour  Intake 2856.58 ml  Output 1500 ml  Net 1356.58 ml   Filed Weights   11/20/20 0507  Weight: 83 kg    Examination:  Constitutional: NAD, AAOx3 HEENT: conjunctivae and lids normal, EOMI CV: No cyanosis.    RESP: normal respiratory effort, on RA Extremities: No effusions, edema in BLE SKIN: warm, dry Neuro: II - XII grossly intact.     Data Reviewed: I have personally reviewed following labs and imaging studies  CBC: Recent Labs  Lab 11/23/20 0556 11/24/20 0440 11/25/20 0611 11/26/20 0451 11/27/20 0458 11/28/20 0455 11/29/20 0419  WBC 10.9* 10.0 8.7 7.2 5.8 4.1 5.4  NEUTROABS 8.6* 7.9* 7.3 5.8  --   --   --   HGB 9.4* 9.7* 9.3* 7.8* 8.2* 7.1* 7.6*  HCT 29.4* 30.4* 28.4* 24.4* 25.8* 22.4* 22.8*  MCV 82.4 81.3 83.0 83.3 84.0 83.9 82.6  PLT 67* 59* 65* 75* 90* 87* 79*   Basic Metabolic Panel: Recent Labs  Lab 11/24/20 0440 11/26/20 0451 11/27/20 0458 11/28/20 0455 11/29/20 0419  NA 135 135 136 136 137  K 4.7 3.7 3.5 3.4* 4.0  CL 106 107 106 108 110  CO2 26 24 24 25 23   GLUCOSE 91 68* 84 165* 91  BUN 8 9 8 12 12   CREATININE 0.56 0.89 1.00 1.35* 1.38*  CALCIUM 7.9* 7.5* 7.8* 7.5* 7.2*  MG  --  1.5* 1.5* 1.6* 2.4  PHOS  --  3.2 3.1  3.2  --    GFR: Estimated Creatinine Clearance: 52.5 mL/min (A) (by C-G formula based on SCr of 1.38 mg/dL (H)). Liver Function Tests: Recent Labs  Lab 11/23/20 0556 11/24/20 0440 11/28/20 0455  AST 28 23 45*  ALT 5 <5 6  ALKPHOS 58 58 71  BILITOT 0.9 0.7 0.4  PROT 6.3* 6.1* 5.3*  ALBUMIN 2.5* 2.5* 2.0*   Recent Labs  Lab 11/25/20 4193 11/26/20 0451 11/27/20 0458 11/28/20 0455 11/29/20 0419  LIPASE 325* 372* 305* 387* 209*   No results for input(s): AMMONIA in the last 168 hours.  Coagulation Profile: No results for input(s): INR, PROTIME in the last 168 hours.  Cardiac Enzymes: No results for input(s): CKTOTAL, CKMB, CKMBINDEX, TROPONINI in the last 168 hours. BNP (last 3 results) No results for input(s): PROBNP in the last 8760 hours. HbA1C: No results for input(s): HGBA1C in the last 72 hours. CBG: Recent Labs  Lab 11/28/20 1125 11/28/20 1606 11/28/20 2057 11/29/20 0718 11/29/20 1129  GLUCAP 238* 152* 91 72  163*   Lipid Profile: No results for input(s): CHOL, HDL, LDLCALC, TRIG, CHOLHDL, LDLDIRECT in the last 72 hours.  Thyroid Function Tests: No results for input(s): TSH, T4TOTAL, FREET4, T3FREE, THYROIDAB in the last 72 hours.   Anemia Panel: No results for input(s): VITAMINB12, FOLATE, FERRITIN, TIBC, IRON, RETICCTPCT in the last 72 hours.  Sepsis Labs: Recent Labs  Lab 11/27/20 0458 11/28/20 0455 11/29/20 0419  PROCALCITON 1.79 2.07 1.80    Recent Results (from the past 240 hour(s))  Blood culture (routine x 2)     Status: None   Collection Time: 11/19/20  4:47 PM   Specimen: BLOOD  Result Value Ref Range Status   Specimen Description BLOOD RIGHT ANTECUBITAL  Final   Special Requests   Final    BOTTLES DRAWN AEROBIC AND ANAEROBIC Blood Culture adequate volume   Culture   Final    NO GROWTH 5 DAYS Performed at [facility], [address] [place], [place] [address]    Report Status 11/24/2020 FINAL  Final  Resp Panel by RT-PCR (Flu A&B, Covid) Nasopharyngeal Swab     Status: None   Collection Time: 11/19/20  4:48 PM   Specimen: Nasopharyngeal Swab; Nasopharyngeal(NP) swabs in vial transport medium  Result Value Ref Range Status   SARS Coronavirus 2 by RT PCR NEGATIVE NEGATIVE Final    Comment: (NOTE) SARS-CoV-2 target nucleic acids are NOT DETECTED.  The SARS-CoV-2 RNA is generally detectable in upper respiratory specimens during the acute phase of infection. The lowest concentration of SARS-CoV-2 viral copies this assay can detect is 138 copies/mL. A negative result does not preclude SARS-Cov-2 infection and should not be used as the sole basis for treatment or other patient management decisions. A negative result may occur with  improper specimen collection/handling, submission of specimen other than nasopharyngeal swab, presence of viral mutation(s) within the areas targeted by this assay, and inadequate number of viral copies(<138 copies/mL). A  negative result must be combined with clinical observations, patient history, and epidemiological information. The expected result is Negative.  Fact Sheet for Patients:  EntrepreneurPulse.com.au  Fact Sheet for Healthcare Providers:  IncredibleEmployment.be  This test is no t yet approved or cleared by the [place] FDA and  has been authorized for detection and/or diagnosis of SARS-CoV-2 by FDA under an Emergency Use Authorization (EUA). This EUA will remain  in effect (meaning this test can be used) for the duration of the COVID-19 declaration under Section  564(b)(1) of the Act, 21 U.S.C.section 360bbb-3(b)(1), unless the authorization is terminated  or revoked sooner.       Influenza A by PCR NEGATIVE NEGATIVE Final   Influenza B by PCR NEGATIVE NEGATIVE Final    Comment: (NOTE) The Xpert Xpress SARS-CoV-2/FLU/RSV plus assay is intended as an aid in the diagnosis of influenza from Nasopharyngeal swab specimens and should not be used as a sole basis for treatment. Nasal washings and aspirates are unacceptable for Xpert Xpress SARS-CoV-2/FLU/RSV testing.  Fact Sheet for Patients: EntrepreneurPulse.com.au  Fact Sheet for Healthcare Providers: IncredibleEmployment.be  This test is not yet approved or cleared by the [place] FDA and has been authorized for detection and/or diagnosis of SARS-CoV-2 by FDA under an Emergency Use Authorization (EUA). This EUA will remain in effect (meaning this test can be used) for the duration of the COVID-19 declaration under Section 564(b)(1) of the Act, 21 U.S.C. section 360bbb-3(b)(1), unless the authorization is terminated or revoked.  Performed at [facility], [address] [place], [place] [address]   Blood culture (routine x 2)     Status: None   Collection Time: 11/19/20  6:46 PM   Specimen: BLOOD  Result Value Ref Range Status    Specimen Description BLOOD RIGHT ANTECUBITAL  Final   Special Requests   Final    BOTTLES DRAWN AEROBIC AND ANAEROBIC Blood Culture adequate volume   Culture   Final    NO GROWTH 5 DAYS Performed at [facility], [address], [place], [place] [address]    Report Status 11/24/2020 FINAL  Final  Urine Culture     Status: Abnormal   Collection Time: 11/19/20  9:33 PM   Specimen: Urine, Clean Catch  Result Value Ref Range Status   Specimen Description   Final    URINE, CLEAN CATCH Performed at [facility], [address] [place], [place] [address]    Special Requests   Final    NONE Performed at [facility], [address], [place], [place] [address]    Culture (A)  Final    <10,000 COLONIES/mL INSIGNIFICANT GROWTH Performed at [facility], [place] [address] [place], [place] [address]    Report Status 11/21/2020 FINAL  Final  Culture, fungus without smear     Status: None (Preliminary result)   Collection Time: 11/20/20 11:09 AM   Specimen: CSF; Cerebrospinal Fluid  Result Value Ref Range Status   Specimen Description   Final    CSF Performed at [facility], [address] [place], [place] [address]    Special Requests   Final    NONE Performed at [facility], [address], [place], [place] [address]    Culture   Final    NO FUNGUS ISOLATED AFTER 9 DAYS Performed at [facility], [place] [address], [place], [place] [address]    Report Status PENDING  Incomplete  CSF culture w Gram Stain     Status: None   Collection Time: 11/20/20 11:09 AM   Specimen: PATH Cytology CSF; Cerebrospinal Fluid  Result Value Ref Range Status   Specimen Description   Final    CSF Performed at [facility], [address], [place], [place] [address]    Special Requests   Final    NONE Performed at [facility], [address] [place], [place] [address]    Gram Stain   Final    NO ORGANISMS SEEN WBC  SEEN  RED BLOOD CELLS PRESENT Performed at [facility], [address] [place], [place] [address]    Culture   Final    NO GROWTH 3 DAYS Performed at [facility], [place] [address], [place], [place] [address]    Report Status 11/23/2020 FINAL  Final  Fungus Culture With Stain     Status: None (Preliminary result)   Collection Time: 11/20/20 11:09 AM   Specimen: PATH Cytology CSF; Cerebrospinal Fluid  Result Value Ref Range Status   Fungus Stain Final report  Final    Comment: (NOTE) Performed At: [facility] [place], [place] [id number] Rush Farmer MD WC:[id number]    Fungus (Mycology) Culture PENDING  Incomplete   Fungal Source CSF  Final    Comment: Performed at [facility], [address] [place], [place] [address]  Fungus Culture Result     Status: None   Collection Time: 11/20/20 11:09 AM  Result Value Ref Range Status   Result 1 Comment  Final    Comment: (NOTE) KOH/Calcofluor preparation:  no fungus observed. Performed At: [facility] [place], [place] [id number] Rush Farmer MD QP:[id number]   Resp Panel by RT-PCR (Flu A&B, Covid) Nasopharyngeal Swab     Status: None   Collection Time: 11/27/20  1:39 PM   Specimen: Nasopharyngeal Swab; Nasopharyngeal(NP) swabs in vial transport medium  Result Value Ref Range Status   SARS Coronavirus 2 by RT PCR NEGATIVE NEGATIVE Final    Comment: (NOTE) SARS-CoV-2 target nucleic acids are NOT DETECTED.  The SARS-CoV-2 RNA is generally detectable in upper respiratory specimens during the acute phase of infection. The lowest concentration of SARS-CoV-2 viral copies this assay can detect is 138 copies/mL. A negative result does not preclude SARS-Cov-2 infection and should not be used as the sole basis for treatment or other patient management decisions. A negative result may occur with  improper specimen collection/handling, submission of specimen  other than nasopharyngeal swab, presence of viral mutation(s) within the areas targeted by this assay, and inadequate number of viral copies(<138 copies/mL). A negative result must be combined with clinical observations, patient history, and epidemiological information. The expected result is Negative.  Fact Sheet for Patients:  EntrepreneurPulse.com.au  Fact Sheet for Healthcare Providers:  IncredibleEmployment.be  This test is no t yet approved or cleared by the [place] FDA and  has been authorized for detection and/or diagnosis of SARS-CoV-2 by FDA under an Emergency Use Authorization (EUA). This EUA will remain  in effect (meaning this test can be used) for the duration of the COVID-19 declaration under Section 564(b)(1) of the Act, 21 U.S.C.section 360bbb-3(b)(1), unless the authorization is terminated  or revoked sooner.       Influenza A by PCR NEGATIVE NEGATIVE Final   Influenza B by PCR NEGATIVE NEGATIVE Final    Comment: (NOTE) The Xpert Xpress SARS-CoV-2/FLU/RSV plus assay is intended as an aid in the diagnosis of influenza from Nasopharyngeal swab specimens and should not be used as a sole basis for treatment. Nasal washings and aspirates are unacceptable for Xpert Xpress SARS-CoV-2/FLU/RSV testing.  Fact Sheet for Patients: EntrepreneurPulse.com.au  Fact Sheet for Healthcare Providers: IncredibleEmployment.be  This test is not yet approved or cleared by the [place] FDA and has been authorized for detection and/or diagnosis of SARS-CoV-2 by FDA under an Emergency Use Authorization (EUA). This EUA will remain in effect (meaning this test can be used) for the duration of the COVID-19 declaration under Section 564(b)(1)  of the Act, 21 U.S.C. section 360bbb-3(b)(1), unless the authorization is terminated or revoked.  Performed at [facility], [address],  [place], [place] [address]   Urine Culture     Status: Abnormal (Preliminary result)   Collection Time: 11/27/20  2:15 PM   Specimen: Urine, Clean Catch  Result Value Ref Range Status   Specimen Description   Final    URINE, CLEAN CATCH Performed at [facility], [address], [place], [place] [address]    Special Requests   Final    NONE Performed at [facility], [place], [place] [address]    Culture (A)  Final    20,000 COLONIES/mL ENTEROBACTER AEROGENES SUSCEPTIBILITIES TO FOLLOW Performed at [facility], [place] [address] [place], [place] [address]    Report Status PENDING  Incomplete  CULTURE, BLOOD (ROUTINE X 2) w Reflex to ID Panel     Status: None (Preliminary result)   Collection Time: 11/27/20  8:23 PM   Specimen: BLOOD  Result Value Ref Range Status   Specimen Description BLOOD LAC  Final   Special Requests   Final    BOTTLES DRAWN AEROBIC AND ANAEROBIC Blood Culture results may not be optimal due to an inadequate volume of blood received in culture bottles   Culture   Final    NO GROWTH 2 DAYS Performed at [facility], [address] [place], [place] [address]    Report Status PENDING  Incomplete  CULTURE, BLOOD (ROUTINE X 2) w Reflex to ID Panel     Status: None (Preliminary result)   Collection Time: 11/27/20  9:32 PM   Specimen: BLOOD LEFT HAND  Result Value Ref Range Status   Specimen Description BLOOD LEFT HAND  Final   Special Requests   Final    BOTTLES DRAWN AEROBIC AND ANAEROBIC Blood Culture adequate volume   Culture   Final    NO GROWTH 2 DAYS Performed at [facility], [address], [place], [place] [address]    Report Status PENDING  Incomplete         Radiology Studies: MR BRAIN W WO CONTRAST  Result Date: 11/28/2020 CLINICAL DATA:  Mental status change, unknown cause; encephalopathy of unknown cause; recurrent fever. EXAM: MRI HEAD WITHOUT AND WITH CONTRAST TECHNIQUE:  Multiplanar, multiecho pulse sequences of the brain and surrounding structures were obtained without and with intravenous contrast. CONTRAST:  24mL GADAVIST GADOBUTROL 1 MMOL/ML IV SOLN COMPARISON:  Head CT November 21, 2020 FINDINGS: Brain: Bilateral T2 hyperintense subdural fluid collections measuring up to 5 mm on the right and 6 mm on the left with mild mass effect on the adjacent brain parenchyma without midline shift. Postcontrast images are severely degraded by motion. Mild diffuse pachymeningeal contrast enhancement seen. The brain parenchyma has normal morphology and signal characteristics. No acute infarction, hemorrhage, hydrocephalus, or mass lesion. Vascular: Normal flow voids. Skull and upper cervical spine: Normal marrow signal. Sinuses/Orbits: Negative. Other: None. IMPRESSION: Interval development of small bilateral subdural fluid collections, may be related to recent lumbar puncture. Electronically Signed   By: Pedro Earls M.D.   On: 11/28/2020 14:54   MR CERVICAL SPINE W WO CONTRAST  Result Date: 11/28/2020 CLINICAL DATA:  History of epidural abscess and paraspinal abscess in the setting of recurrent fever and altered mental status. EXAM: MRI CERVICAL, THORACIC AND LUMBAR SPINE WITHOUT AND WITH CONTRAST TECHNIQUE: Multiplanar and multiecho pulse sequences of the cervical spine, to include  the craniocervical junction and cervicothoracic junction, and thoracic and lumbar spine, were obtained without and with intravenous contrast. CONTRAST:  67mL GADAVIST GADOBUTROL 1 MMOL/ML IV SOLN COMPARISON:  MRI of the cervical, thoracic and lumbar spine June 17, 2020. FINDINGS: The study is partially degraded by motion. Postcontrast axial images are essentially nondiagnostic. MRI CERVICAL SPINE FINDINGS Alignment: Physiologic. Vertebrae: No fracture, evidence of discitis, or bone lesion. Cord: Increased T2 signal within the cord at the C3 level. Posterior Fossa, vertebral arteries, paraspinal  tissues: Negative. Disc levels: C2-3: Posterior disc protrusion causing mild mass effect on the cord without significant spinal canal stenosis. Facet degenerative changes resulting mild left neural foraminal narrowing. C3-4: Left posterolateral disc protrusion causing moderate to severe spinal canal stenosis with mass effect on the cord. Increased T2 signal is seen within the cord immediately above the level of compression, likely edema. Uncovertebral and facet degenerative changes resulting in moderate right and severe left neural foraminal narrowing. C4-5: Posterior disc protrusion asymmetric to the left causing mass effect on the cord and resulting in moderate spinal canal stenosis. Uncovertebral and facet degenerative changes resulting in severe bilateral neural foraminal narrowing. C5-6: Posterior disc osteophyte complex causing mass effect on the cord and resulting in moderate spinal canal stenosis. Uncovertebral and facet degenerative changes resulting severe bilateral neural foraminal narrowing. C6-7: Left posterolateral disc protrusion causing indentation of the thecal sac and resulting mild spinal canal stenosis. Uncovertebral and facet degenerative changes resulting in mild bilateral neural foraminal narrowing. C7-T1: Left posterolateral disc protrusion causing indentation on the thecal sac and resulting in mild left neural foraminal narrowing. No significant spinal canal stenosis. MRI THORACIC SPINE FINDINGS Alignment:  Physiologic. Vertebrae: No fracture, evidence of discitis, or bone lesion. Postsurgical changes from T3-4, T5-7 and T9-10 laminectomies. Interval resolution of the large posterior epidural fluid collection. Cord: Central increased T2 signal within the cord at T6-7, T7-8 and from the T8-9 level to the conus medullaris. Paraspinal and other soft tissues: Small bilateral pleural effusion. A 1.5 cm T2 hyperintense hepatic lesion. Disc levels: Tiny posterior disc protrusions at T3-4, T5-6,  T6-7 and T7-8 causing minimal indentation on the thecal sac. No significant spinal canal or neural foraminal stenosis at any thoracic level. MRI LUMBAR SPINE FINDINGS Segmentation:  Standard. Alignment:  Physiologic. Vertebrae: No fracture, evidence of discitis, or bone lesion. Endplate degenerative changes at L5-S1. Conus medullaris and cauda equina: Conus extends to the L1-2 level. Increased T2 signal within the conus medullaris, as described above. 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 Paraspinal and other soft tissues: Negative. Disc levels: T12-L1: No spinal canal or neural foraminal stenosis. L1-2: No spinal canal or neural foraminal stenosis. L2-3: Shallow disc bulge and moderate facet degenerative changes without significant spinal canal or neural foraminal stenosis. L3-4: Moderate facet degenerative changes. No spinal canal or neural foraminal stenosis. L4-5: Mild loss of disc height, disc bulge and moderate facet degenerative changes without significant spinal canal or neural foraminal stenosis. L5-S1: Loss of disc height, disc bulge with superimposed left central/subarticular disc protrusion and moderate facet degenerative changes. Findings result in narrowing of the left subarticular zone with displacement of the traversing left S1 nerve root. No significant neural foraminal narrowing. IMPRESSION: 1. The study is degraded by motion, particularly the postcontrast images. 2. Interval resolution of the large posterior thoracic epidural fluid collection with postsurgical changes from multilevel laminectomy noted in the thoracic spine. No new fluid collection identified. 3. Increased T2 signal within  the cord centrally at T6-7, T7-8 and from the T8-9 level to the conus medullaris without cord expansion. This may represent post ischemic changes related to now resolved cord compression. 4. 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 5. 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 6. Multilevel high-grade neural foraminal narrowing at the cervical spine. 7. Mild degenerative changes of the thoracic spine without spinal canal or neural foraminal stenosis. 8. Degenerative disc disease at L5-S1 with narrowing of the left subarticular zone and likely impingement of the traversing left S1 nerve root. Electronically Signed   By: Pedro Earls M.D.   On: 11/28/2020 15:41   MR THORACIC SPINE W WO CONTRAST  Result Date: 11/28/2020 CLINICAL DATA:  History of epidural abscess and paraspinal abscess in the setting of recurrent fever and altered mental status. EXAM: MRI CERVICAL, THORACIC AND LUMBAR SPINE WITHOUT AND WITH CONTRAST TECHNIQUE: Multiplanar and multiecho pulse sequences of the cervical spine, to include the craniocervical junction and cervicothoracic junction, and thoracic and lumbar spine, were obtained without and with intravenous contrast. CONTRAST:  31mL GADAVIST GADOBUTROL 1 MMOL/ML IV SOLN COMPARISON:  MRI of the cervical, thoracic and lumbar spine June 17, 2020. FINDINGS: The study is partially degraded by motion. Postcontrast axial images are essentially nondiagnostic. MRI CERVICAL SPINE FINDINGS Alignment: Physiologic. Vertebrae: No fracture, evidence of discitis, or bone lesion. Cord: Increased T2 signal within the cord at the C3 level. Posterior Fossa, vertebral arteries, paraspinal tissues: Negative. Disc levels: C2-3: Posterior disc protrusion causing mild mass effect on the cord without significant spinal canal stenosis. Facet degenerative changes resulting mild left neural foraminal narrowing. C3-4: Left posterolateral disc protrusion causing moderate to severe spinal canal stenosis with mass effect on the cord. Increased T2 signal is seen within the cord  immediately above the level of compression, likely edema. Uncovertebral and facet degenerative changes resulting in moderate right and severe left neural foraminal narrowing. C4-5: Posterior disc protrusion asymmetric to the left causing mass effect on the cord and resulting in moderate spinal canal stenosis. Uncovertebral and facet degenerative changes resulting in severe bilateral neural foraminal narrowing. C5-6: Posterior disc osteophyte complex causing mass effect on the cord and resulting in moderate spinal canal stenosis. Uncovertebral and facet degenerative changes resulting severe bilateral neural foraminal narrowing. C6-7: Left posterolateral disc protrusion causing indentation of the thecal sac and resulting mild spinal canal stenosis. Uncovertebral and facet degenerative changes resulting in mild bilateral neural foraminal narrowing. C7-T1: Left posterolateral disc protrusion causing indentation on the thecal sac and resulting in mild left neural foraminal narrowing. No significant spinal canal stenosis. MRI THORACIC SPINE FINDINGS Alignment:  Physiologic. Vertebrae: No fracture, evidence of discitis, or bone lesion. Postsurgical changes from T3-4, T5-7 and T9-10 laminectomies. Interval resolution of the large posterior epidural fluid collection. Cord: Central increased T2 signal within the cord at T6-7, T7-8 and from the T8-9 level to the conus medullaris. Paraspinal and other soft tissues: Small bilateral pleural effusion. A 1.5 cm T2 hyperintense hepatic lesion. Disc levels: Tiny posterior disc protrusions at T3-4, T5-6, T6-7 and T7-8 causing minimal indentation on the thecal sac. No significant spinal canal or neural foraminal stenosis at any thoracic level. MRI LUMBAR SPINE FINDINGS Segmentation:  Standard. Alignment:  Physiologic. Vertebrae: No fracture, evidence of discitis, or bone lesion. Endplate degenerative changes at L5-S1. Conus medullaris and  cauda equina: Conus extends to the L1-2 level.  Increased T2 signal within the conus medullaris, as described above. 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 Paraspinal and other soft tissues: Negative. Disc levels: T12-L1: No spinal canal or neural foraminal stenosis. L1-2: No spinal canal or neural foraminal stenosis. L2-3: Shallow disc bulge and moderate facet degenerative changes without significant spinal canal or neural foraminal stenosis. L3-4: Moderate facet degenerative changes. No spinal canal or neural foraminal stenosis. L4-5: Mild loss of disc height, disc bulge and moderate facet degenerative changes without significant spinal canal or neural foraminal stenosis. L5-S1: Loss of disc height, disc bulge with superimposed left central/subarticular disc protrusion and moderate facet degenerative changes. Findings result in narrowing of the left subarticular zone with displacement of the traversing left S1 nerve root. No significant neural foraminal narrowing. IMPRESSION: 1. The study is degraded by motion, particularly the postcontrast images. 2. Interval resolution of the large posterior thoracic epidural fluid collection with postsurgical changes from multilevel laminectomy noted in the thoracic spine. No new fluid collection identified. 3. Increased T2 signal within the cord centrally at T6-7, T7-8 and from the T8-9 level to the conus medullaris without cord expansion. This may represent post ischemic changes related to now resolved cord compression. 4. 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 5. 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 6. Multilevel high-grade neural foraminal narrowing at the cervical spine. 7. Mild degenerative changes of the thoracic spine without spinal canal or neural foraminal stenosis. 8.  Degenerative disc disease at L5-S1 with narrowing of the left subarticular zone and likely impingement of the traversing left S1 nerve root. Electronically Signed   By: Pedro Earls M.D.   On: 11/28/2020 15:41   MR Lumbar Spine W Wo Contrast  Result Date: 11/28/2020 CLINICAL DATA:  History of epidural abscess and paraspinal abscess in the setting of recurrent fever and altered mental status. EXAM: MRI CERVICAL, THORACIC AND LUMBAR SPINE WITHOUT AND WITH CONTRAST TECHNIQUE: Multiplanar and multiecho pulse sequences of the cervical spine, to include the craniocervical junction and cervicothoracic junction, and thoracic and lumbar spine, were obtained without and with intravenous contrast. CONTRAST:  20mL GADAVIST GADOBUTROL 1 MMOL/ML IV SOLN COMPARISON:  MRI of the cervical, thoracic and lumbar spine June 17, 2020. FINDINGS: The study is partially degraded by motion. Postcontrast axial images are essentially nondiagnostic. MRI CERVICAL SPINE FINDINGS Alignment: Physiologic. Vertebrae: No fracture, evidence of discitis, or bone lesion. Cord: Increased T2 signal within the cord at the C3 level. Posterior Fossa, vertebral arteries, paraspinal tissues: Negative. Disc levels: C2-3: Posterior disc protrusion causing mild mass effect on the cord without significant spinal canal stenosis. Facet degenerative changes resulting mild left neural foraminal narrowing. C3-4: Left posterolateral disc protrusion causing moderate to severe spinal canal stenosis with mass effect on the cord. Increased T2 signal is seen within the cord immediately above the level of compression, likely edema. Uncovertebral and facet degenerative changes resulting in moderate right and severe left neural foraminal narrowing. C4-5: Posterior disc protrusion asymmetric to the left causing mass effect on the cord and resulting in moderate spinal canal stenosis. Uncovertebral and facet degenerative changes resulting in severe bilateral  neural foraminal narrowing. C5-6: Posterior disc osteophyte complex causing mass effect on the cord and resulting in moderate  spinal canal stenosis. Uncovertebral and facet degenerative changes resulting severe bilateral neural foraminal narrowing. C6-7: Left posterolateral disc protrusion causing indentation of the thecal sac and resulting mild spinal canal stenosis. Uncovertebral and facet degenerative changes resulting in mild bilateral neural foraminal narrowing. C7-T1: Left posterolateral disc protrusion causing indentation on the thecal sac and resulting in mild left neural foraminal narrowing. No significant spinal canal stenosis. MRI THORACIC SPINE FINDINGS Alignment:  Physiologic. Vertebrae: No fracture, evidence of discitis, or bone lesion. Postsurgical changes from T3-4, T5-7 and T9-10 laminectomies. Interval resolution of the large posterior epidural fluid collection. Cord: Central increased T2 signal within the cord at T6-7, T7-8 and from the T8-9 level to the conus medullaris. Paraspinal and other soft tissues: Small bilateral pleural effusion. A 1.5 cm T2 hyperintense hepatic lesion. Disc levels: Tiny posterior disc protrusions at T3-4, T5-6, T6-7 and T7-8 causing minimal indentation on the thecal sac. No significant spinal canal or neural foraminal stenosis at any thoracic level. MRI LUMBAR SPINE FINDINGS Segmentation:  Standard. Alignment:  Physiologic. Vertebrae: No fracture, evidence of discitis, or bone lesion. Endplate degenerative changes at L5-S1. Conus medullaris and cauda equina: Conus extends to the L1-2 level. Increased T2 signal within the conus medullaris, as described above. 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 Paraspinal and other soft tissues: Negative. Disc levels: T12-L1: No spinal canal or neural foraminal stenosis. L1-2: No spinal canal or neural foraminal stenosis. L2-3: Shallow disc bulge and moderate facet  degenerative changes without significant spinal canal or neural foraminal stenosis. L3-4: Moderate facet degenerative changes. No spinal canal or neural foraminal stenosis. L4-5: Mild loss of disc height, disc bulge and moderate facet degenerative changes without significant spinal canal or neural foraminal stenosis. L5-S1: Loss of disc height, disc bulge with superimposed left central/subarticular disc protrusion and moderate facet degenerative changes. Findings result in narrowing of the left subarticular zone with displacement of the traversing left S1 nerve root. No significant neural foraminal narrowing. IMPRESSION: 1. The study is degraded by motion, particularly the postcontrast images. 2. Interval resolution of the large posterior thoracic epidural fluid collection with postsurgical changes from multilevel laminectomy noted in the thoracic spine. No new fluid collection identified. 3. Increased T2 signal within the cord centrally at T6-7, T7-8 and from the T8-9 level to the conus medullaris without cord expansion. This may represent post ischemic changes related to now resolved cord compression. 4. 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 5. 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 6. Multilevel high-grade neural foraminal narrowing at the cervical spine. 7. Mild degenerative changes of the thoracic spine without spinal canal or neural foraminal stenosis. 8. Degenerative disc disease at L5-S1 with narrowing of the left subarticular zone and likely impingement of the traversing left S1 nerve root. Electronically Signed   By: Pedro Earls M.D.   On: 11/28/2020 15:41        Scheduled Meds:  acetaminophen  1,000 mg Oral Once   Chlorhexidine Gluconate Cloth  6 each Topical Daily   diltiazem  120 mg Oral Daily   feeding  supplement  1 Container Oral TID BM   hydrALAZINE  50 mg Oral Q8H   insulin aspart  0-15 Units Subcutaneous TID WC   insulin glargine-yfgn  10 Units Subcutaneous Daily  levothyroxine  100 mcg Oral QAC breakfast   magnesium oxide  800 mg Oral BID   mouth rinse  15 mL Mouth Rinse q12n4p   mirtazapine  15 mg Oral QHS   multivitamin with minerals  1 tablet Oral Daily   pantoprazole  40 mg Oral Daily   polyethylene glycol  34 g Oral BID   rosuvastatin  10 mg Oral QHS   sodium chloride flush  10-40 mL Intracatheter Q12H   thiamine  100 mg Oral Daily   Continuous Infusions:  sodium chloride 100 mL/hr at 11/29/20 1513      LOS: 9 days     Enzo Bi, MD [facility] Pager [phone]  If 7PM-7AM, please contact night-coverage 11/29/2020, 3:35 PM

## 2020-11-29 NOTE — Progress Notes (Signed)
Physical Therapy Treatment Patient Details Name: Tonya Myers MRN: [id number] DOB: Jan 28, 1966 Today's Date: 11/29/2020    History of Present Illness Pt admitted to [facility] on 11/19/20 for decreased appetite, vomiting, AMS, and lethargy. On EMS assessment, pt was found to have fever 102 and AMS with confusion and left sided weakness.  Found to have pancytopenia, oncology was consulted due to hx of CLL, but so far no concern of CLL relapse. ID also on board, initially concerning for meningitis due to hx of pneumococcal meningitis. PMH significant for: DDD, cervical radiculopathy, anxiety, DM, HTN, hypothyroidism, CLL, asthma, cLBP, vertigo, and hx of stroke.    PT Comments    Pt agrees to session.  Participated in exercises as described below.  Pt inc x 2 of small soft BM during session and rolling left/right for care provided.  She does agree to sitting EOB.  Mod a  x 1 initially for balance that required assist to prevent forward fall off edge of bed.  Once stable she is able to sit with close supervision and guarding.  Seated AROM at EOB BLE x 10.  She is able to lateral scoot x 1 towards HOB with mod a x 1 and cues for sequencing.  She returns upper body to bed but requires mod/max for lower body.  Pt did not seem to put as much effort into todays session when comparing assist needed to yesterday.  She did state she wanted to go home and did not want to proceed with rehab and that she would get better at home.  Husband in and stated she was doing well at home and felt she would be able to get back there with time and rehab at home.  Did discuss possible need for lift to allow for OOB as she was unsafe to try with +1 assist today but they are uninterested at this time.   If pt chooses to return home she will benefit from HHPT/OT and possible lift to allow for safet OOB transfers with +1 assist if they agree.   Follow Up Recommendations  [name]     Equipment Recommendations  Hospital bed;Other  (comment)    Recommendations for Other Services OT consult     Precautions / Restrictions Precautions Precautions: Fall Restrictions Weight Bearing Restrictions: No    Mobility  Bed Mobility Overal bed mobility: Needs Assistance Bed Mobility: Supine to Sit;Sit to Supine     Supine to sit: Min assist;Mod assist Sit to supine: Mod assist   General bed mobility comments: needs increased assist today today and generally poor sitting balance initially progressing to close supervision/min gaurd.    Transfers Overall transfer level: Needs assistance   Transfers: Lateral/Scoot Transfers Sit to Stand: Mod assist;Max assist         General transfer comment: deferred.  Generally unsteady and unsafe in sitting.  unsafe to attempt standing with +1 assist.  Ambulation/Gait         Gait velocity: decreased   General Gait Details: unsafe   Stairs             Wheelchair Mobility    Modified Rankin (Stroke Patients Only)       Balance Overall balance assessment: Needs assistance Sitting-balance support: No upper extremity supported;Feet supported Sitting balance-Leahy Scale: Good     Standing balance support: Bilateral upper extremity supported;During functional activity Standing balance-Leahy Scale: Poor Standing balance comment: Required mod assist due to balance/impulsivity/poor safety awareness  Cognition Arousal/Alertness: Awake/alert Behavior During Therapy: WFL for tasks assessed/performed Overall Cognitive Status: Impaired/Different from baseline                                        Exercises General Exercises - Lower Extremity Ankle Circles/Pumps: AROM;10 reps;Both Long Arc Quad: AROM;Both;10 reps Heel Slides: AROM;Both;10 reps Hip ABduction/ADduction: AROM;Both;10 reps Straight Leg Raises: AAROM;Both;10 reps Other Exercises Other Exercises: BM x 2 during session and care provided.     General Comments        Pertinent Vitals/Pain Pain Assessment: No/denies pain    Home Living                      Prior Function            PT Goals (current goals can now be found in the care plan section) Progress towards PT goals: Progressing toward goals    Frequency    7X/week      PT Plan Current plan remains appropriate    Co-evaluation              AM-PAC PT "6 Clicks" Mobility   Outcome Measure  Help needed turning from your back to your side while in a flat bed without using bedrails?: A Little Help needed moving from lying on your back to sitting on the side of a flat bed without using bedrails?: A Lot Help needed moving to and from a bed to a chair (including a wheelchair)?: A Lot Help needed standing up from a chair using your arms (e.g., wheelchair or bedside chair)?: A Lot Help needed to walk in hospital room?: Total Help needed climbing 3-5 steps with a railing? : Total 6 Click Score: 11    End of Session   Activity Tolerance: Patient tolerated treatment well;Other (comment) (seemed to have decreased motivation today) Patient left: in bed;with bed alarm set;with family/visitor present;with call bell/phone within reach Nurse Communication: Mobility status PT Visit Diagnosis: Unsteadiness on feet (R26.81);Muscle weakness (generalized) (M62.81);Difficulty in walking, not elsewhere classified (R26.2);Hemiplegia and hemiparesis Hemiplegia - Right/Left: Right     Time: 4944-9675 PT Time Calculation (min) (ACUTE ONLY): 31 min  Charges:  $Therapeutic Exercise: 8-22 mins $Therapeutic Activity: 8-22 mins                    Chesley Noon, PTA 11/29/20, 10:22 AM , 10:16 AM

## 2020-11-30 DIAGNOSIS — R410 Disorientation, unspecified: Secondary | ICD-10-CM | POA: Diagnosis not present

## 2020-11-30 LAB — URINE CULTURE: Culture: 20000 — AB

## 2020-11-30 LAB — GLUCOSE, CAPILLARY
Glucose-Capillary: 149 mg/dL — ABNORMAL HIGH (ref 70–99)
Glucose-Capillary: 192 mg/dL — ABNORMAL HIGH (ref 70–99)
Glucose-Capillary: 178 mg/dL — ABNORMAL HIGH (ref 70–99)
Glucose-Capillary: 239 mg/dL — ABNORMAL HIGH (ref 70–99)

## 2020-11-30 LAB — BASIC METABOLIC PANEL
Anion gap: 2 — ABNORMAL LOW (ref 5–15)
BUN: 14 mg/dL (ref 6–20)
CO2: 23 mmol/L (ref 22–32)
Calcium: 7.3 mg/dL — ABNORMAL LOW (ref 8.9–10.3)
Chloride: 111 mmol/L (ref 98–111)
Creatinine, Ser: 1.54 mg/dL — ABNORMAL HIGH (ref 0.44–1.00)
GFR, Estimated: 39 mL/min — ABNORMAL LOW (ref >60)
Glucose, Bld: 161 mg/dL — ABNORMAL HIGH (ref 70–99)
Potassium: 3.6 mmol/L (ref 3.5–5.1)
Sodium: 136 mmol/L (ref 135–145)

## 2020-11-30 LAB — HEPATIC FUNCTION PANEL
ALT: 7 U/L (ref 0–44)
AST: 58 U/L — ABNORMAL HIGH (ref 15–41)
Albumin: 1.9 g/dL — ABNORMAL LOW (ref 3.5–5.0)
Alkaline Phosphatase: 74 U/L (ref 38–126)
Bilirubin, Direct: 0.1 mg/dL (ref 0.0–0.2)
Indirect Bilirubin: 0.4 mg/dL (ref 0.3–0.9)
Total Bilirubin: 0.5 mg/dL (ref 0.3–1.2)
Total Protein: 5.2 g/dL — ABNORMAL LOW (ref 6.5–8.1)

## 2020-11-30 LAB — CBC
HCT: 24.3 % — ABNORMAL LOW (ref 36.0–46.0)
Hemoglobin: 7.7 g/dL — ABNORMAL LOW (ref 12.0–15.0)
MCH: 25.9 pg — ABNORMAL LOW (ref 26.0–34.0)
MCHC: 31.7 g/dL (ref 30.0–36.0)
MCV: 81.8 fL (ref 80.0–100.0)
Platelets: 73 10*3/uL — ABNORMAL LOW (ref 150–400)
RBC: 2.97 MIL/uL — ABNORMAL LOW (ref 3.87–5.11)
RDW: 17.6 % — ABNORMAL HIGH (ref 11.5–15.5)
WBC: 4.9 10*3/uL (ref 4.0–10.5)
nRBC: 0 % (ref 0.0–0.2)

## 2020-11-30 LAB — FERRITIN: Ferritin: 690 ng/mL — ABNORMAL HIGH (ref 11–307)

## 2020-11-30 LAB — LIPASE, BLOOD: Lipase: 306 U/L — ABNORMAL HIGH (ref 11–51)

## 2020-11-30 LAB — MAGNESIUM: Magnesium: 2.2 mg/dL (ref 1.7–2.4)

## 2020-11-30 MED ORDER — POLYETHYLENE GLYCOL 3350 17 G PO PACK
34.0000 g | PACK | Freq: Two times a day (BID) | ORAL | Status: DC | PRN
  Administered 2020-12-09: 13:00:00 34 g via ORAL
  Administered 2020-12-11: 21:00:00 17 g via ORAL
  Administered 2020-12-11 – 2020-12-13 (×2): 34 g via ORAL
  Filled 2020-11-30 (×5): qty 2

## 2020-11-30 MED ORDER — CIPROFLOXACIN HCL 500 MG PO TABS
250.0000 mg | ORAL_TABLET | Freq: Two times a day (BID) | ORAL | Status: DC
  Administered 2020-11-30 – 2020-12-02 (×6): 250 mg via ORAL
  Filled 2020-11-30 (×6): qty 1

## 2020-11-30 NOTE — TOC Progression Note (Signed)
Transition of Care [name] - Progression Note    Patient Details  Name: Tonya Myers MRN: [id number] Date of Birth: November 18, 1965  Transition of [name] [name] [name], [name] Phone Number: 11/30/2020, 9:09 AM  Clinical Narrative:   Spoke to patient's husband. He confirms they do not want [name], would like to return home and resume [name]. CSW notified [name] with [facility]. Patient's husband stated the hospital bed extender has already been delivered by [name]. He declines a lift at this time.   Expected Discharge Plan: [name] Barriers to Discharge: Continued Medical Work up  Expected Discharge Plan and Services Expected Discharge Plan: [name]   Discharge Planning Services: CM Consult Post Acute Care Choice: [name], [name] Living arrangements for the past 2 months: Single Family Home                 DME Arranged: N/A DME Agency: NA       HH Arranged: PT, OT [place] Agency: [name] Date HH Agency Contacted: 11/24/20 Time [place]: 1419 Representative spoke with at [place]: [name] Interventions    Readmission Risk Interventions Readmission Risk Prevention Plan 11/24/2020  Transportation Screening Complete  PCP or Specialist Appt within 3-5 Days Complete  HRI or [place] Complete  Social Work Consult for [place] Planning/Counseling Complete  Palliative Care Screening Not Applicable  Medication Review [employer]) Complete  Some recent data might be hidden

## 2020-11-30 NOTE — Progress Notes (Signed)
PT Cancellation Note  Patient Details Name: Tonya Myers MRN: [id number] DOB: 02/17/1966   Cancelled Treatment:    Reason Eval/Treat Not Completed: Fatigue/lethargy limiting ability to participate  Pt stated she was just up with nursing with +2 assist from staff and husband to commode.  Fatigued.  Declined session at this time.    Chesley Noon 11/30/2020, 2:23 PM

## 2020-11-30 NOTE — Consult Note (Signed)
Pharmacy Antibiotic Note  Tonya Myers is a 55 y.o. female admitted on 11/19/2020 with UTI.  Pharmacy has been consulted for cipro dosing.  Plan: Will start cipro 250mg  q12h  Height: 6' (182.9 cm) Weight: 83 kg (182 lb 15.7 oz) IBW/kg (Calculated) : 73.1  Temp (24hrs), Avg:99.3 F (37.4 C), Min:98 F (36.7 C), Max:102.3 F (39.1 C)  Recent Labs  Lab 11/26/20 0451 11/27/20 0458 11/28/20 0455 11/29/20 0419 11/30/20 0503  WBC 7.2 5.8 4.1 5.4 4.9  CREATININE 0.89 1.00 1.35* 1.38* 1.54*    Estimated Creatinine Clearance: 47.1 mL/min (A) (by C-G formula based on SCr of 1.54 mg/dL (H)).    Allergies  Allergen Reactions   Amoxapine    Penicillins     Antimicrobials this admission: Cefepime 7/27 x 1 Vanc/Metronidazole 7/28 x 1 Acyclovir 7/28 > 8/1 Fluconazole 7/29 >> 8/4 Cipro 8/7 >>  Dose adjustments this admission: None  Microbiology results: 8/4 BCx: NG x 3d 8/4 UCx: Enterobacter Aerogenes 20K cipro sensitive    Thank you for allowing pharmacy to be a part of this patient's care.  Lu Duffel, PharmD, BCPS Clinical Pharmacist 11/30/2020 12:14 PM

## 2020-11-30 NOTE — Progress Notes (Signed)
PROGRESS NOTE    Tonya Myers  JIR:[id number] DOB: 10-26-1965 DOA: 11/19/2020 PCP: [name], MD   Brief Narrative:  55 y.o. female with medical history significant for recent pneumococcal meningitis and bacteremia, depression, hypothyroid, IDDM2, presents to the ED for chief concern of altered mentation.   At bedside, she had expressive aphasia, right upper and lower extremity weakness. Confusion today per spouse. Can walk with a walker and assistance. Jumbled words. She has baseline weakness in the right arm compared to left arm.    Spouse reports that patient had poor PO intake for more than a week. Spouse endorses vomiting and dnies blood and black coffee ground substances.   Patient has been unable to tolerate the MRI due to severe claustrophobia.  Will attempt again with help of IV Ativan.  Lumbar puncture was performed, initial results not terribly impressive.  Not overtly concerning for meningitis.  Remains on broad-spectrum IV antibiotics and IV acyclovir.  Infectious disease consulted.  Concern for possible manifestation of underlying hematologic malignancy.  I have requested consultation from oncology.  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  She sees oncology only once yearly.  Patient was evaluated by oncology.  Very unlikely that current presentation is a manifestation of underlying CLL.  No clear infectious signs.  All cultures remain negative.  LP is inconsistent with meningitis.  CT head chest abdomen pelvis significant surprisingly for pancreatitis.  Lipase is elevated.  Patient has been responding to treatment for pancreatitis including bowel rest and IV fluids.  Oral thrush is responding to treatment.  Mental status remains poor.  Appetite remains poor.  When inquired about why she is not eating she denies throat or abdominal pain and states just poor appetite.  8/2: Patient remains altered and encephalopathic.  Etiology at this point has  been unclear.  I requested consultation from neurology.  Possible vitamin deficiency.  Recommending high-dose IV thiamine.   Assessment & Plan:   Principal Problem:   Subacute delirium Active Problems:   DDD (degenerative disc disease), cervical   Aphasia   Anemia of chronic disease   Acute focal neurological deficit   Encephalitis   Altered mental status   Encephalopathy acute   Fever, recurrent Hx of epidural and paraspinal abscess --intermittent fever to 103 and 102 --repeat covid neg.  No cough or dyspnea to suggest PNA.  Procal increased. --MRI brain, spine 8/5, no obvious infection --UA pos for 20,000 COLONIES/mL ENTEROBACTER AEROGENES  Plan: --start treatment for UTI with Cipro, per ID  Acute pancreatitis Unclear etiology No reported history of alcohol intake, no radiographic evidence of gallstones Possible drug-induced however unclear exactly which drug could have driven this Initial abdominal examination negative for tenderness On interview patient does endorse left upper quadrant tenderness Lipase trending down with IVF. Triglycerides negative Plan: --consider repeat CT a/p if fever recurs   Acute toxic metabolic encephalopathy, resolved Unclear etiology --meningitis ruled out by LP.  CT head neg for new stroke.  Hematology ruled out CLL as the culprit.  No seizure on EEG. --mental status dramatically improved with treatment of pancreatitis and IV thiamine repletion. --MRI brain, no acute finding --cont thiamine  Pancytopenia History of CLL All 3 cell lines acutely depressed Oncology consulted Patient's CLL has been in remission since 2016 Recurrence of hematologic note malignancy felt unlikely --s/p 1u pRBC 8/5 for Hgb 7.1 Plan: --monitor Hgb and transfuse to keep Hgb >7  AKI --Cr up to 1.35 on 8/5, baseline ~0.7.  Likely due to poor oral hydration.  Urine appeared very dark. --Cr trending up despite MIVF Plan: --nephrology consult today  Marked  hypokalemia --monitor and replete PRN  Hypomag --replete with IV mag  Mouth pain and Oral thrush, resolved Patient with clinical indications of thrush Associated with difficulty swallowing Seems to be improving Patient states poor p.o. intake is driven by appetite rather than pain --s/p Diflucan for 3 doses  Insulin-dependent diabetes mellitus Hypoglycemic episodes Patient is on long-acting formulation 50 units daily at home in addition to metformin --A1c 6.5  --hypoglycemia due to q4h SSI Plan: --cont long-acting insulin 10u daily --SSI  Hyperlipidemia --cont statin  Hypothyroid PTA Synthroid  HTN --cont cardizem and hydralazine   DVT prophylaxis: TED hose Code Status: Full code Family Communication: husband updated at bedside today.   Disposition Plan: Status is: Inpatient  Remains inpatient appropriate because:Inpatient level of care appropriate due to severity of illness  Dispo: The patient is from: Home              Anticipated d/c is to: Home (pt again refused [name]).              Patient currently is not medically stable to d/c.  AKI not improving.   Difficult to place patient No   Level of care: Med-Surg  Consultants:  ID Oncology Neurology  Procedures:  Lumbar puncture 7/28  Antimicrobials:    Subjective: Pt had another fever up to 102 last night.  Pt complained of back pain from lying in bed.  Pt and husband reported good oral hydration, however, poor urine output and very dark.  AKI not improving on IVF.  Nephrology consult today.   Objective: Vitals:   11/30/20 0729 11/30/20 1155 11/30/20 1423 11/30/20 1613  BP: (!) 154/82 (!) 155/84 126/65 130/68  Pulse: 81 86 99 97  Resp: 20 20 16 16   Temp: 98.8 F (37.1 C) 99.2 F (37.3 C) 99.3 F (37.4 C) 99.8 F (37.7 C)  TempSrc:    Oral  SpO2: 98% 98% 100% 98%  Weight:      Height:        Intake/Output Summary (Last 24 hours) at 11/30/2020 1816 Last data filed at 11/30/2020 1630 Gross  per 24 hour  Intake 0 ml  Output 575 ml  Net -575 ml   Filed Weights   11/20/20 0507  Weight: 83 kg    Examination:  Constitutional: NAD, AAOx3 HEENT: conjunctivae and lids normal, EOMI CV: No cyanosis.   RESP: normal respiratory effort, on RA Extremities: No effusions, edema in BLE SKIN: warm, dry Neuro: II - XII grossly intact.   Psych: grouchy mood and affect.     Data Reviewed: I have personally reviewed following labs and imaging studies  CBC: Recent Labs  Lab 11/24/20 0440 11/25/20 0611 11/26/20 0451 11/27/20 0458 11/28/20 0455 11/29/20 0419 11/30/20 0503  WBC 10.0 8.7 7.2 5.8 4.1 5.4 4.9  NEUTROABS 7.9* 7.3 5.8  --   --   --   --   HGB 9.7* 9.3* 7.8* 8.2* 7.1* 7.6* 7.7*  HCT 30.4* 28.4* 24.4* 25.8* 22.4* 22.8* 24.3*  MCV 81.3 83.0 83.3 84.0 83.9 82.6 81.8  PLT 59* 65* 75* 90* 87* 79* 73*   Basic Metabolic Panel: Recent Labs  Lab 11/26/20 0451 11/27/20 0458 11/28/20 0455 11/29/20 0419 11/30/20 0503  NA 135 136 136 137 136  K 3.7 3.5 3.4* 4.0 3.6  CL 107 106 108 110 111  CO2 24 24 25  23  23  GLUCOSE 68* 84 165* 91 161*  BUN 9 8 12 12 14   CREATININE 0.89 1.00 1.35* 1.38* 1.54*  CALCIUM 7.5* 7.8* 7.5* 7.2* 7.3*  MG 1.5* 1.5* 1.6* 2.4 2.2  PHOS 3.2 3.1 3.2  --   --    GFR: Estimated Creatinine Clearance: 47.1 mL/min (A) (by C-G formula based on SCr of 1.54 mg/dL (H)). Liver Function Tests: Recent Labs  Lab 11/24/20 0440 11/28/20 0455 11/30/20 0503  AST 23 45* 58*  ALT 5 6 7   ALKPHOS 58 71 74  BILITOT 0.7 0.4 0.5  PROT 6.1* 5.3* 5.2*  ALBUMIN 2.5* 2.0* 1.9*   Recent Labs  Lab 11/26/20 0451 11/27/20 0458 11/28/20 0455 11/29/20 0419 11/30/20 0503  LIPASE 372* 305* 387* 209* 306*   No results for input(s): AMMONIA in the last 168 hours.  Coagulation Profile: No results for input(s): INR, PROTIME in the last 168 hours.  Cardiac Enzymes: No results for input(s): CKTOTAL, CKMB, CKMBINDEX, TROPONINI in the last 168 hours. BNP (last  3 results) No results for input(s): PROBNP in the last 8760 hours. HbA1C: No results for input(s): HGBA1C in the last 72 hours. CBG: Recent Labs  Lab 11/29/20 2107 11/29/20 2207 11/30/20 0726 11/30/20 1150 11/30/20 1614  GLUCAP 233* 209* 149* 192* 178*   Lipid Profile: No results for input(s): CHOL, HDL, LDLCALC, TRIG, CHOLHDL, LDLDIRECT in the last 72 hours.  Thyroid Function Tests: No results for input(s): TSH, T4TOTAL, FREET4, T3FREE, THYROIDAB in the last 72 hours.   Anemia Panel: Recent Labs    11/30/20 0503  FERRITIN 690*    Sepsis Labs: Recent Labs  Lab 11/27/20 0458 11/28/20 0455 11/29/20 0419  PROCALCITON 1.79 2.07 1.80    Recent Results (from the past 240 hour(s))  Resp Panel by RT-PCR (Flu A&B, Covid) Nasopharyngeal Swab     Status: None   Collection Time: 11/27/20  1:39 PM   Specimen: Nasopharyngeal Swab; Nasopharyngeal(NP) swabs in vial transport medium  Result Value Ref Range Status   SARS Coronavirus 2 by RT PCR NEGATIVE NEGATIVE Final    Comment: (NOTE) SARS-CoV-2 target nucleic acids are NOT DETECTED.  The SARS-CoV-2 RNA is generally detectable in upper respiratory specimens during the acute phase of infection. The lowest concentration of SARS-CoV-2 viral copies this assay can detect is 138 copies/mL. A negative result does not preclude SARS-Cov-2 infection and should not be used as the sole basis for treatment or other patient management decisions. A negative result may occur with  improper specimen collection/handling, submission of specimen other than nasopharyngeal swab, presence of viral mutation(s) within the areas targeted by this assay, and inadequate number of viral copies(<138 copies/mL). A negative result must be combined with clinical observations, patient history, and epidemiological information. The expected result is Negative.  Fact Sheet for Patients:  EntrepreneurPulse.com.au  Fact Sheet for Healthcare  Providers:  IncredibleEmployment.be  This test is no t yet approved or cleared by the [place] FDA and  has been authorized for detection and/or diagnosis of SARS-CoV-2 by FDA under an Emergency Use Authorization (EUA). This EUA will remain  in effect (meaning this test can be used) for the duration of the COVID-19 declaration under Section 564(b)(1) of the Act, 21 U.S.C.section 360bbb-3(b)(1), unless the authorization is terminated  or revoked sooner.       Influenza A by PCR NEGATIVE NEGATIVE Final   Influenza B by PCR NEGATIVE NEGATIVE Final    Comment: (NOTE) The Xpert Xpress SARS-CoV-2/FLU/RSV plus assay is intended as  an aid in the diagnosis of influenza from Nasopharyngeal swab specimens and should not be used as a sole basis for treatment. Nasal washings and aspirates are unacceptable for Xpert Xpress SARS-CoV-2/FLU/RSV testing.  Fact Sheet for Patients: EntrepreneurPulse.com.au  Fact Sheet for Healthcare Providers: IncredibleEmployment.be  This test is not yet approved or cleared by the [place] FDA and has been authorized for detection and/or diagnosis of SARS-CoV-2 by FDA under an Emergency Use Authorization (EUA). This EUA will remain in effect (meaning this test can be used) for the duration of the COVID-19 declaration under Section 564(b)(1) of the Act, 21 U.S.C. section 360bbb-3(b)(1), unless the authorization is terminated or revoked.  Performed at [facility], [address] [place], [place] [address]   Urine Culture     Status: Abnormal   Collection Time: 11/27/20  2:15 PM   Specimen: Urine, Clean Catch  Result Value Ref Range Status   Specimen Description   Final    URINE, CLEAN CATCH Performed at [facility], [address] [place], [place] [address]    Special Requests   Final    NONE Performed at [facility], [place], [place]  [address]    Culture 20,000 COLONIES/mL ENTEROBACTER AEROGENES (A)  Final   Report Status 11/30/2020 FINAL  Final   Organism ID, Bacteria ENTEROBACTER AEROGENES (A)  Final      Susceptibility   Enterobacter aerogenes - MIC*    CEFAZOLIN >=64 RESISTANT Resistant     CEFEPIME 1 SENSITIVE Sensitive     CEFTRIAXONE >=64 RESISTANT Resistant     CIPROFLOXACIN <=0.25 SENSITIVE Sensitive     GENTAMICIN <=1 SENSITIVE Sensitive     IMIPENEM <=0.25 SENSITIVE Sensitive     NITROFURANTOIN 64 INTERMEDIATE Intermediate     TRIMETH/SULFA <=20 SENSITIVE Sensitive     PIP/TAZO >=128 RESISTANT Resistant     * 20,000 COLONIES/mL ENTEROBACTER AEROGENES  CULTURE, BLOOD (ROUTINE X 2) w Reflex to ID Panel     Status: None (Preliminary result)   Collection Time: 11/27/20  8:23 PM   Specimen: BLOOD  Result Value Ref Range Status   Specimen Description BLOOD LAC  Final   Special Requests   Final    BOTTLES DRAWN AEROBIC AND ANAEROBIC Blood Culture results may not be optimal due to an inadequate volume of blood received in culture bottles   Culture   Final    NO GROWTH 3 DAYS Performed at [facility], [address] [place], [place] [address]    Report Status PENDING  Incomplete  CULTURE, BLOOD (ROUTINE X 2) w Reflex to ID Panel     Status: None (Preliminary result)   Collection Time: 11/27/20  9:32 PM   Specimen: BLOOD LEFT HAND  Result Value Ref Range Status   Specimen Description BLOOD LEFT HAND  Final   Special Requests   Final    BOTTLES DRAWN AEROBIC AND ANAEROBIC Blood Culture adequate volume   Culture   Final    NO GROWTH 3 DAYS Performed at [facility], [address], [place], [place] [address]    Report Status PENDING  Incomplete         Radiology Studies: No results found.      Scheduled Meds:  acetaminophen  1,000 mg Oral Once   Chlorhexidine Gluconate Cloth  6 each Topical Daily   ciprofloxacin  250 mg Oral BID   diltiazem  120 mg Oral Daily   feeding  supplement  1 Container  Oral TID BM   hydrALAZINE  50 mg Oral Q8H   insulin aspart  0-15 Units Subcutaneous TID WC   insulin glargine-yfgn  10 Units Subcutaneous Daily   levothyroxine  100 mcg Oral QAC breakfast   magnesium oxide  800 mg Oral BID   mouth rinse  15 mL Mouth Rinse q12n4p   mirtazapine  15 mg Oral QHS   multivitamin with minerals  1 tablet Oral Daily   pantoprazole  40 mg Oral Daily   rosuvastatin  10 mg Oral QHS   sodium chloride flush  10-40 mL Intracatheter Q12H   thiamine  100 mg Oral Daily   Continuous Infusions:     LOS: 10 days     Enzo Bi, MD [facility] Pager [phone]  If 7PM-7AM, please contact night-coverage 11/30/2020, 6:16 PM

## 2020-12-01 ENCOUNTER — Inpatient Hospital Stay: Payer: Medicare Other

## 2020-12-01 DIAGNOSIS — R410 Disorientation, unspecified: Secondary | ICD-10-CM | POA: Diagnosis not present

## 2020-12-01 DIAGNOSIS — R509 Fever, unspecified: Secondary | ICD-10-CM | POA: Diagnosis not present

## 2020-12-01 LAB — BASIC METABOLIC PANEL
Anion gap: 7 (ref 5–15)
BUN: 15 mg/dL (ref 6–20)
CO2: 20 mmol/L — ABNORMAL LOW (ref 22–32)
Calcium: 7.5 mg/dL — ABNORMAL LOW (ref 8.9–10.3)
Chloride: 109 mmol/L (ref 98–111)
Creatinine, Ser: 1.86 mg/dL — ABNORMAL HIGH (ref 0.44–1.00)
GFR, Estimated: 31 mL/min — ABNORMAL LOW (ref >60)
Glucose, Bld: 148 mg/dL — ABNORMAL HIGH (ref 70–99)
Potassium: 3.9 mmol/L (ref 3.5–5.1)
Sodium: 136 mmol/L (ref 135–145)

## 2020-12-01 LAB — GLUCOSE, CAPILLARY
Glucose-Capillary: 137 mg/dL — ABNORMAL HIGH (ref 70–99)
Glucose-Capillary: 164 mg/dL — ABNORMAL HIGH (ref 70–99)
Glucose-Capillary: 238 mg/dL — ABNORMAL HIGH (ref 70–99)
Glucose-Capillary: 142 mg/dL — ABNORMAL HIGH (ref 70–99)

## 2020-12-01 LAB — CBC
HCT: 22.3 % — ABNORMAL LOW (ref 36.0–46.0)
Hemoglobin: 7.5 g/dL — ABNORMAL LOW (ref 12.0–15.0)
MCH: 28 pg (ref 26.0–34.0)
MCHC: 33.6 g/dL (ref 30.0–36.0)
MCV: 83.2 fL (ref 80.0–100.0)
Platelets: 65 10*3/uL — ABNORMAL LOW (ref 150–400)
RBC: 2.68 MIL/uL — ABNORMAL LOW (ref 3.87–5.11)
RDW: 17.5 % — ABNORMAL HIGH (ref 11.5–15.5)
WBC: 4.5 10*3/uL (ref 4.0–10.5)
nRBC: 0 % (ref 0.0–0.2)

## 2020-12-01 LAB — MAGNESIUM: Magnesium: 1.8 mg/dL (ref 1.7–2.4)

## 2020-12-01 LAB — LIPASE, BLOOD: Lipase: 270 U/L — ABNORMAL HIGH (ref 11–51)

## 2020-12-01 IMAGING — US US RENAL
1 series · 14 of 25 positions shown · non-contrast
Comparison: CT abdomen and pelvis [DATE]

CLINICAL DATA: Acute kidney injury, history CLL, diabetes mellitus,
hypertension

EXAM:
RENAL / URINARY TRACT ULTRASOUND COMPLETE

[Series 1: us renal · 56 acquisitions, 14 frames shown]
[im 1/56]
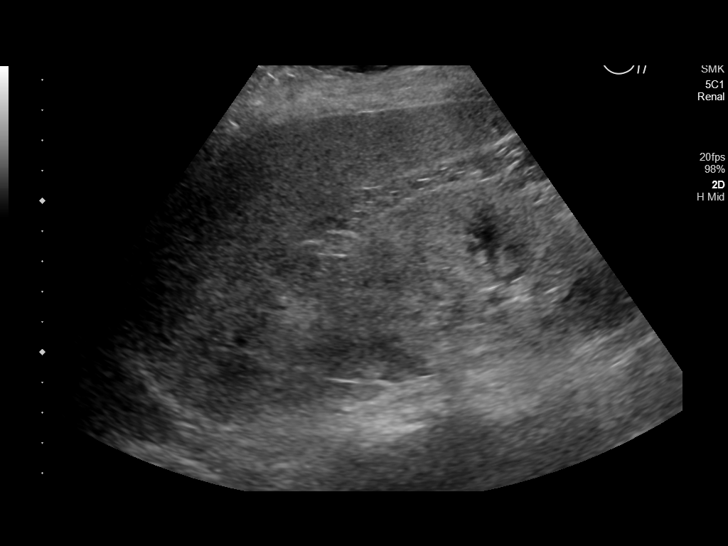
[im 5/56]
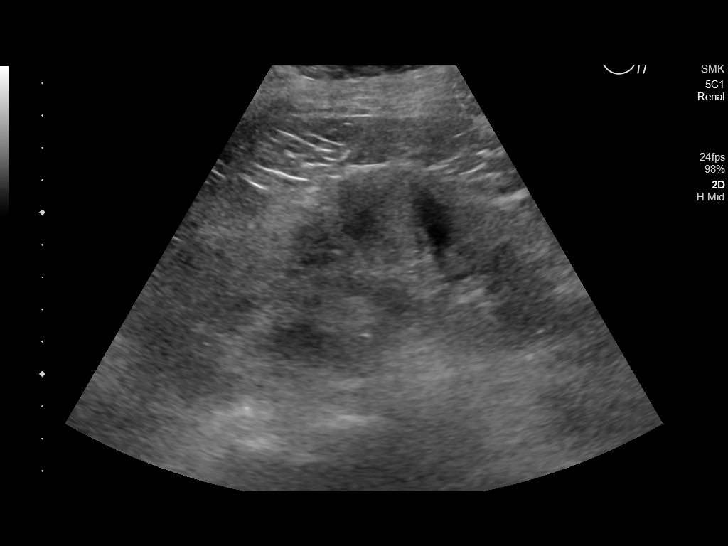
[im 10/56]
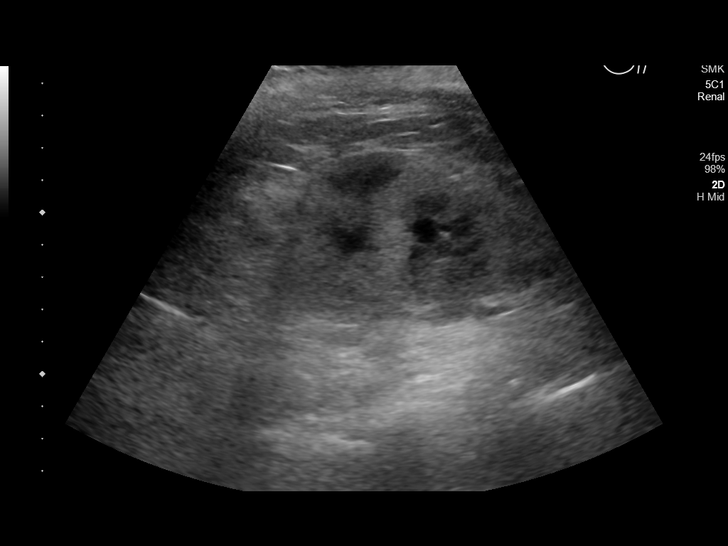
[im 14/56]
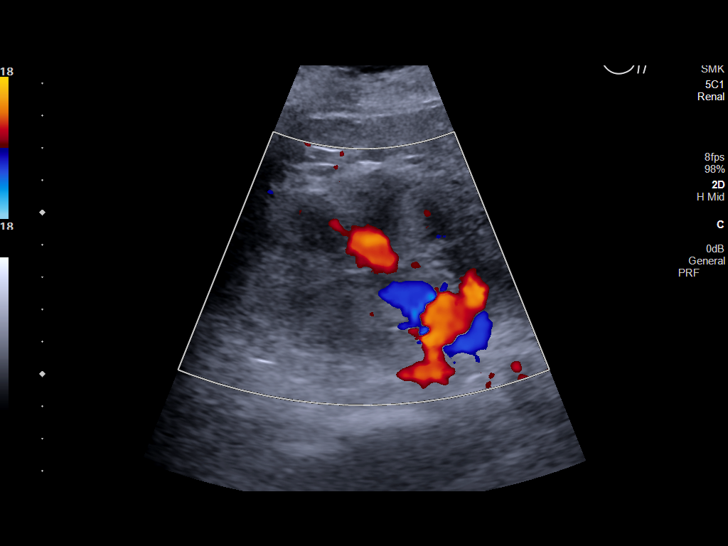
[im 19/56]
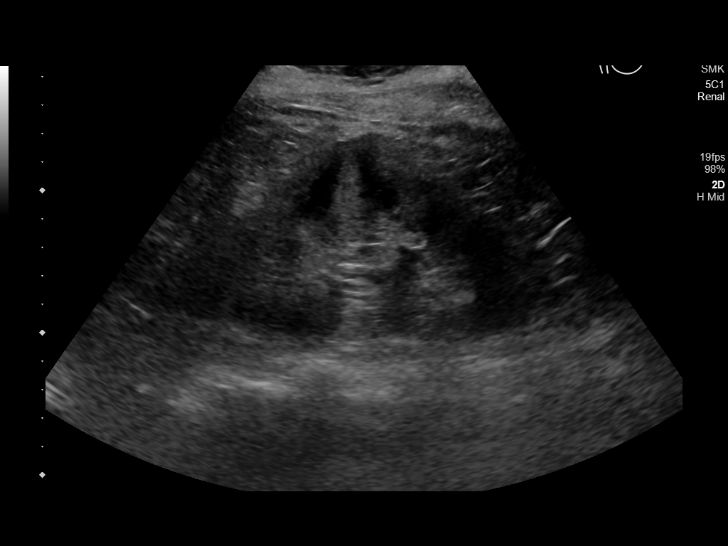
[im 21/56]
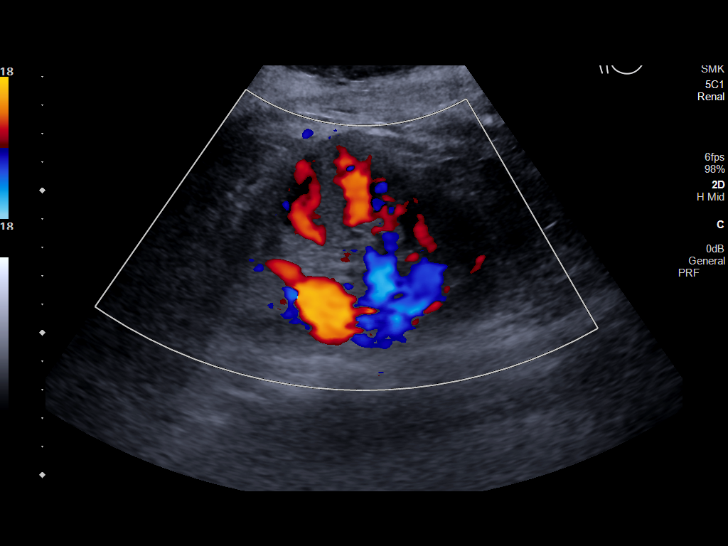
[im 26/56]
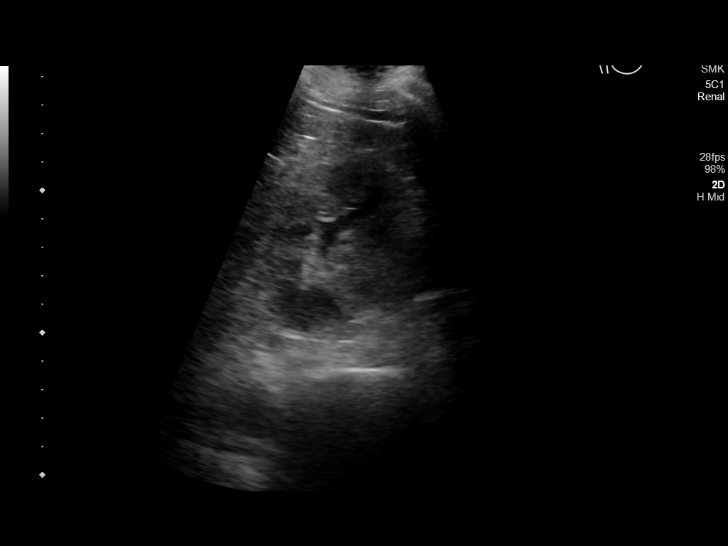
[im 30/56]
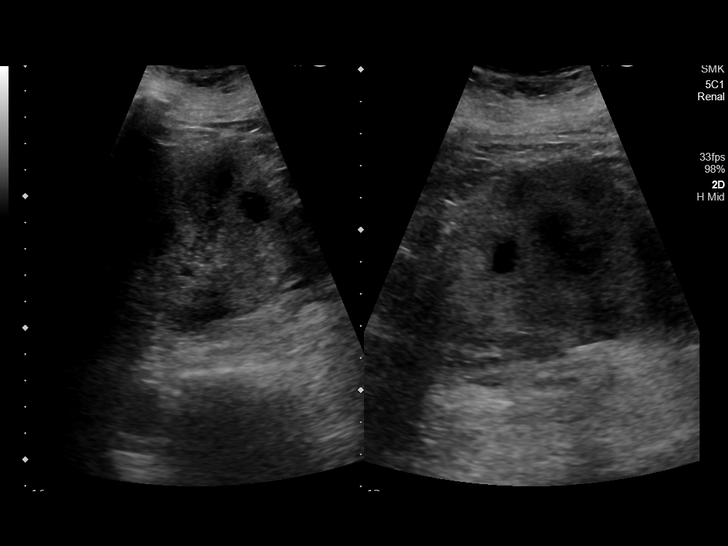
[im 35/56]
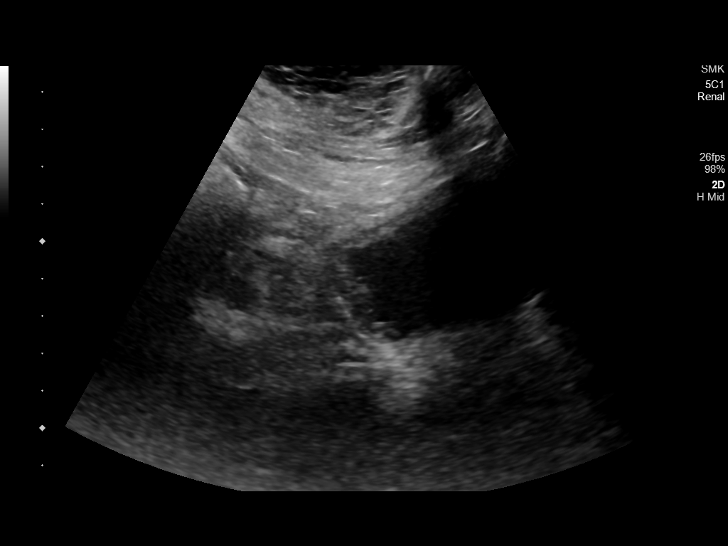
[im 37/56]
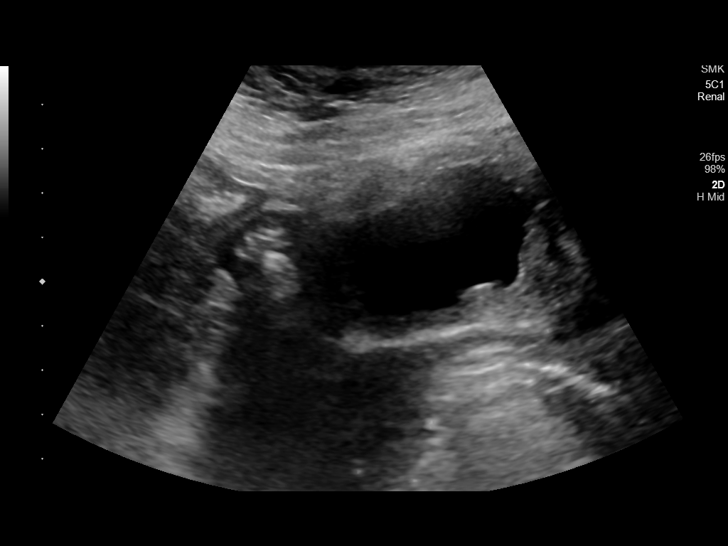
[im 42/56]
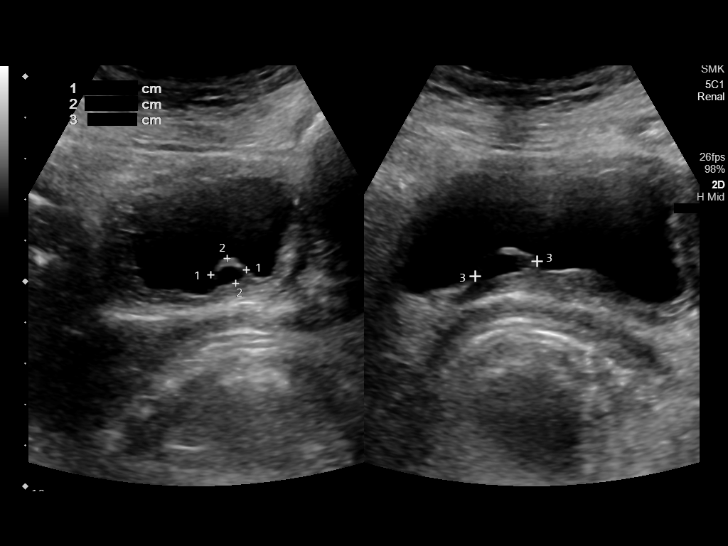
[im 46/56]
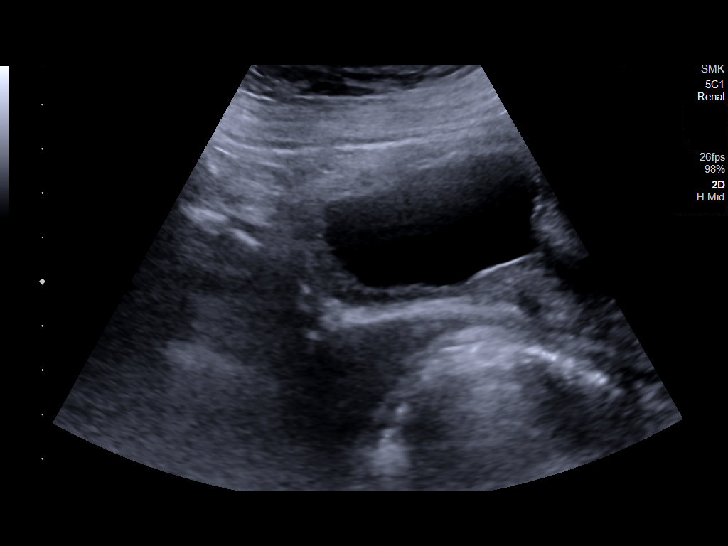
[im 51/56]
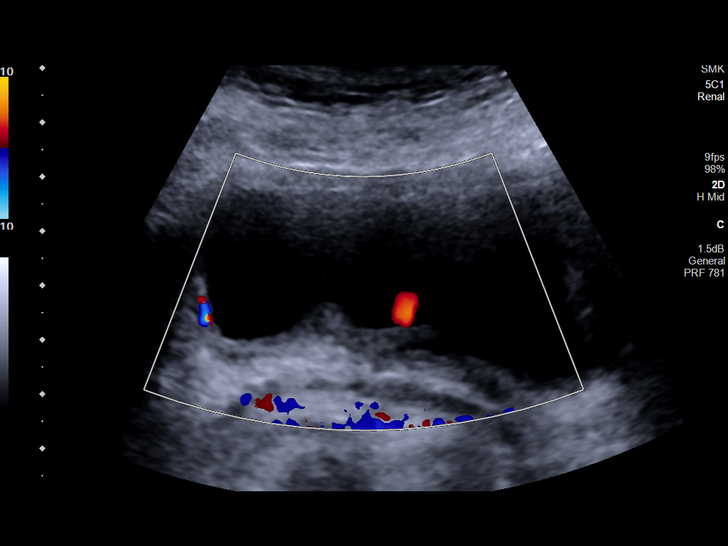
[im 56/56]
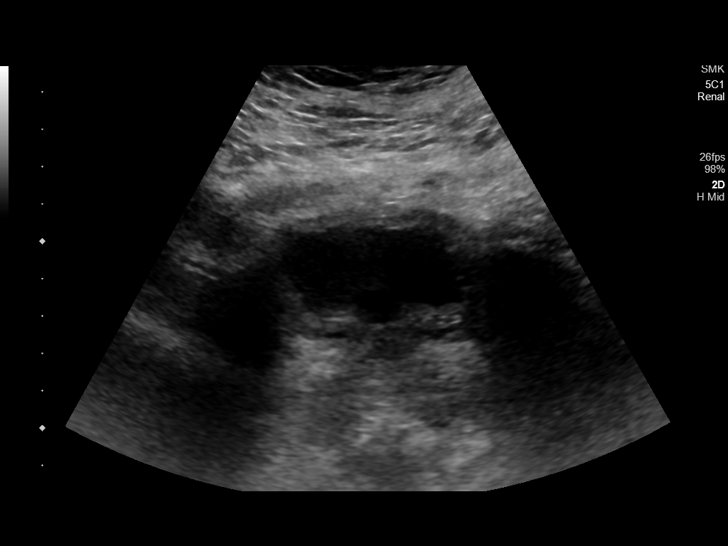

[14 of 25 positions shown; findings below may reference images not displayed]

FINDINGS: Right Kidney:

Renal measurements: 11.4 x 5.8 x 6.0 cm = volume: 206 mL. Normal
cortical thickness. Increased cortical echogenicity. No mass,
hydronephrosis, or shadowing calcification.

Left Kidney:

Renal measurements: 13.1 x 7.3 x 6.1 cm = volume: 3 L4 mL. Normal
cortical thickness. Increased cortical echogenicity. Tiny cyst at
mid kidney 9 x 13 x 13 mm, simple features. No additional mass,
hydronephrosis, or shadowing calcification.

Bladder:

Partially distended. Cystic intraluminal focus at the posterior wall
RIGHT of midline question RIGHT ureterocele approximately 16 x 6 x 9
mm in size.

Other:

N/A
IMPRESSION: Medical renal disease changes of both kidneys.

Intraluminal cystic focus posterior RIGHT bladder, favor
representing a small RIGHT ureterocele.

## 2020-12-01 MED ORDER — SODIUM CHLORIDE 0.9 % IV SOLN
100.0000 mg | Freq: Two times a day (BID) | INTRAVENOUS | Status: DC
  Administered 2020-12-01 – 2020-12-04 (×6): 100 mg via INTRAVENOUS
  Filled 2020-12-01 (×7): qty 100

## 2020-12-01 NOTE — Plan of Care (Signed)
  Problem: Clinical Measurements: Goal: Respiratory complications will improve Outcome: Progressing   Problem: Coping: Goal: Level of anxiety will decrease Outcome: Progressing   Problem: Safety: Goal: Ability to remain free from injury will improve Outcome: Progressing   Problem: Skin Integrity: Goal: Risk for impaired skin integrity will decrease Outcome: Progressing

## 2020-12-01 NOTE — Progress Notes (Signed)
Nutrition Follow-up  DOCUMENTATION CODES:  Not applicable  INTERVENTION:  Continue current diet as ordered, encourage PO intake Snacks TID between meals MVI with minerals daily Boost Breeze po TID, each supplement provides 250 kcal and 9 grams of protein New measured weight  NUTRITION DIAGNOSIS:  Inadequate oral intake related to  (altered mental status) as evidenced by meal completion < 25%.  GOAL:  Patient will meet greater than or equal to 90% of their needs  MONITOR:  PO intake, Supplement acceptance, Labs, Weight trends  REASON FOR ASSESSMENT:  Consult Poor PO  ASSESSMENT:  55 y.o. female with a past medical history of anxiety/depression, CLL, diabetes, HLD, and HTN presented to ED with AMS and poor appetite.  Pt has undergone extensive workup for cause of AMS without clear cause. Husband reports pt had meningitis earlier this year and then went to rehab for ~ 1 month after her hospitalization due to deconditioning. Reports that pt has been working with PT at home and was ambulating with a walker but is still not back to her baseline functional status.  Pt resting in bed at the time of visit, family at bedside. Pt endorses an improved appetite since last assessment, husband at bedside endorses this. Pt reports she has been drinking the boost supplements and the snacks that are coming between meals. Reviewed intake of meals, still appears to be very poor. No new weight since 7/28. Will request.  Average Meal Intake: 7/29-8/2: 0% intake x 4 recorded meals 8/3-8/8: 3% intake x 4 recorded meals  Nutritionally Relevant Medications: Scheduled Meds:  ciprofloxacin  250 mg Oral BID   feeding supplement  1 Container Oral TID BM   insulin aspart  0-15 Units Subcutaneous TID WC   insulin glargine-yfgn  10 Units Subcutaneous Daily   magnesium oxide  800 mg Oral BID   mirtazapine  15 mg Oral QHS   multivitamin with minerals  1 tablet Oral Daily   pantoprazole  40 mg Oral Daily    rosuvastatin  10 mg Oral QHS   thiamine  100 mg Oral Daily   PRN Meds: ondansetron, polyethylene glycol  Labs Reviewed: Lipase 270 Creatinine 1.86 Ferritin 690 SBG ranges from 137-239 mg/dL over the last 24 hours HgbA1c 6.5% (10/02/20)  NUTRITION - FOCUSED PHYSICAL EXAM: Flowsheet Row Most Recent Value  Orbital Region No depletion  Upper Arm Region No depletion  Thoracic and Lumbar Region No depletion  Buccal Region No depletion  Temple Region No depletion  Clavicle Bone Region Mild depletion  Clavicle and Acromion Bone Region No depletion  Scapular Bone Region No depletion  Dorsal Hand No depletion  Patellar Region No depletion  Anterior Thigh Region No depletion  Posterior Calf Region No depletion  Edema (RD Assessment) Mild  Hair Reviewed  Eyes Reviewed  Mouth Reviewed  Skin Reviewed  Nails Reviewed   Diet Order:   Diet Order             DIET SOFT Room service appropriate? Yes; Fluid consistency: Thin  Diet effective now                  EDUCATION NEEDS:  No education needs have been identified at this time  Skin:  Skin Assessment: Reviewed RN Assessment  Last BM:  7/28  Height:  Ht Readings from Last 1 Encounters:  11/20/20 6' (1.829 m)    Weight:  Wt Readings from Last 1 Encounters:  11/20/20 83 kg    Ideal Body Weight:  72.7 kg  BMI:  Body mass index is 24.82 kg/m.  Estimated Nutritional Needs:  Kcal:  2000-2200 kcal/d Protein:  100-110 g/d Fluid:  >2L/d  Ranell Patrick, RD, LDN Clinical Dietitian Pager on [place]

## 2020-12-01 NOTE — Progress Notes (Signed)
Date of Admission:  11/19/2020     ID: Tonya Myers is a 55 y.o. female  Principal Problem:   Subacute delirium Active Problems:   DDD (degenerative disc disease), cervical   Aphasia   Anemia of chronic disease   Acute focal neurological deficit   Encephalitis   Altered mental status   Encephalopathy acute    Subjective: Continues to have intermittent fever She feels cold No nausea or vomiting No pain abdomen She wanted to know whether the fever could be from tick/lyme' Says she has 4 dogs and removed a tick from her body in june  Medications:   acetaminophen  1,000 mg Oral Once   Chlorhexidine Gluconate Cloth  6 each Topical Daily   ciprofloxacin  250 mg Oral BID   diltiazem  120 mg Oral Daily   feeding supplement  1 Container Oral TID BM   hydrALAZINE  50 mg Oral Q8H   insulin aspart  0-15 Units Subcutaneous TID WC   insulin glargine-yfgn  10 Units Subcutaneous Daily   levothyroxine  100 mcg Oral QAC breakfast   magnesium oxide  800 mg Oral BID   mouth rinse  15 mL Mouth Rinse q12n4p   mirtazapine  15 mg Oral QHS   multivitamin with minerals  1 tablet Oral Daily   pantoprazole  40 mg Oral Daily   rosuvastatin  10 mg Oral QHS   sodium chloride flush  10-40 mL Intracatheter Q12H   thiamine  100 mg Oral Daily    Objective: Vital signs in last 24 hours: Temp:  [98.6 F (37 C)-101 F (38.3 C)] 100 F (37.8 C) (08/08 1214) Pulse Rate:  [77-97] 85 (08/08 1214) Resp:  [16-20] 20 (08/08 1214) BP: (116-152)/(59-71) 129/59 (08/08 1214) SpO2:  [93 %-98 %] 98 % (08/08 1214)  PHYSICAL EXAM:  General: Alert, cooperative, no distress,  Head: Normocephalic, without obvious abnormality, atraumatic. Eyes: Conjunctivae clear, anicteric sclerae. Pupils are equal ENT Nares normal. No drainage or sinus tenderness. Lips, mucosa, and tongue normal. No Thrush Neck: Supple, symmetrical, no adenopathy, thyroid: non tender no carotid bruit and no JVD. Back: No CVA  tenderness. Lungs: Clear to auscultation bilaterally. No Wheezing or Rhonchi. No rales. Heart: Regular rate and rhythm, no murmur, rub or gallop. Abdomen: Soft, non-tender,not distended. Bowel sounds normal. No masses Extremities: atraumatic, no cyanosis. No edema. No clubbing Skin: No rashes or lesions. Or bruising Lymph: Cervical, supraclavicular normal. Neurologic: Grossly non-focal  Lab Results Recent Labs    11/30/20 0503 12/01/20 0620  WBC 4.9 4.5  HGB 7.7* 7.5*  HCT 24.3* 22.3*  NA 136 136  K 3.6 3.9  CL 111 109  CO2 23 20*  BUN 14 15  CREATININE 1.54* 1.86*   Liver Panel Recent Labs    11/30/20 0503  PROT 5.2*  ALBUMIN 1.9*  AST 58*  ALT 7  ALKPHOS 74  BILITOT 0.5  BILIDIR 0.1  IBILI 0.4    Sedimentation Rate No results for input(s): ESRSEDRATE in the last 72 hours. C-Reactive Protein No results for input(s): CRP in the last 72 hours.  Microbiology:  Studies/Results: No results found.   Assessment/Plan: Encephalopathy on admission.  Is resolved.  Meningitis and encephalitis were ruled out.  She received high-dose thiamine and that seemed to have made some difference.  Acute pancreatitis  Intermittent fever.  Negative imaging of the spine as well as the brain.  Blood culture negative Urine culture had Enterobacter aerogenes.  Started on ciprofloxacin. As fever persist and  with history of tick attachment in June will start doxycycline.  Thrombocytopenia and anemia.  When she presented she had pancytopenia.  AKI followed by nephrology.  In February 2022 she had disseminated strep pneumo infection with infection in the vertebrae of the thoracic and lumbar spine.  That is resolved completely.  She received 6 weeks of IV antibiotics.  CLL in remission  Discussed the management with the patient and the hospitalist.

## 2020-12-01 NOTE — Consult Note (Signed)
[facility] Consult Note: 12/01/20     Date of Admission:  11/19/2020           Reason for Consult: Acute kidney injury    Referring Provider: Enzo Bi, MD Primary Care Provider: [name], MD   History of Presenting Illness:, History of stroke Tonya Myers is a 55 y.o. female has medical problems of pneumococcal meningitis bacteremia, epidural abscess February 2022 depression, hypothyroidism, insulin-dependent diabetes type 2, history of lymphoma in remission She presented to the emergency room on July 27 for confusion. Hospital course complicated by altered mental status, encephalopathy. At present, patient feels like she is back to her baseline health She is able to eat No shortness of breath No leg edema Husband and family friend at bedside  Review of Systems: ROS currently, Gen: Denies any fevers or chills HEENT: No vision or hearing problems CV: No chest pain or shortness of breath Resp: No cough or sputum production GI: No nausea, vomiting or diarrhea.  No blood in the stool GU : No problems with voiding.  No hematuria.  No previous history of kidney problems MS:   Denies any acute joint pain or swelling Derm:   No complaints Psych: No complaints Heme: No complaints Neuro: No complaints Endocrine: No complaints   Past Medical History:  Diagnosis Date   Acute hemorrhoid 03/11/2015   Anxiety    Arthritis    Asthma    Cancer (HCC)    Chronic back pain    CLL (chronic lymphocytic leukemia) ([place])    Depression    Diabetes mellitus without complication (HCC)    Genital herpes    type 2   Hypertension    Hypothyroidism    Vertigo     Social History   Tobacco Use   Smoking status: Never   Smokeless tobacco: Never  Vaping Use   Vaping Use: Never used  Substance Use Topics   Alcohol use: Yes    Alcohol/week: 1.0 standard drink    Types: 1 Cans of beer per week   Drug use: No    Family History  Problem Relation Age of  Onset   Cancer Paternal Aunt    Breast cancer Maternal Aunt 70     OBJECTIVE: Blood pressure (!) 129/59, pulse 85, temperature 100 F (37.8 C), temperature source Oral, resp. rate 20, height 6' (1.829 m), weight 83 kg, SpO2 98 %.  Physical Exam General appearance: Laying in the bed, no acute distress HEENT: Moist oral mucous membranes, anicteric Neck: Supple Pulmonary: Normal breathing effort on room air Cardiovascular: No peripheral edema Skin: Warm, dry, no acute rashes Neuro: Alert, able to answer questions appropriately  Lab Results Lab Results  Component Value Date   WBC 4.5 12/01/2020   HGB 7.5 (L) 12/01/2020   HCT 22.3 (L) 12/01/2020   MCV 83.2 12/01/2020   PLT 65 (L) 12/01/2020    Lab Results  Component Value Date   CREATININE 1.86 (H) 12/01/2020   BUN 15 12/01/2020   NA 136 12/01/2020   K 3.9 12/01/2020   CL 109 12/01/2020   CO2 20 (L) 12/01/2020    Lab Results  Component Value Date   ALT 7 11/30/2020   AST 58 (H) 11/30/2020   ALKPHOS 74 11/30/2020   BILITOT 0.5 11/30/2020     Microbiology: Recent Results (from the past 240 hour(s))  Resp Panel by RT-PCR (Flu A&B, Covid) Nasopharyngeal Swab     Status: None   Collection Time: 11/27/20  1:39  PM   Specimen: Nasopharyngeal Swab; Nasopharyngeal(NP) swabs in vial transport medium  Result Value Ref Range Status   SARS Coronavirus 2 by RT PCR NEGATIVE NEGATIVE Final    Comment: (NOTE) SARS-CoV-2 target nucleic acids are NOT DETECTED.  The SARS-CoV-2 RNA is generally detectable in upper respiratory specimens during the acute phase of infection. The lowest concentration of SARS-CoV-2 viral copies this assay can detect is 138 copies/mL. A negative result does not preclude SARS-Cov-2 infection and should not be used as the sole basis for treatment or other patient management decisions. A negative result may occur with  improper specimen collection/handling, submission of specimen other than nasopharyngeal  swab, presence of viral mutation(s) within the areas targeted by this assay, and inadequate number of viral copies(<138 copies/mL). A negative result must be combined with clinical observations, patient history, and epidemiological information. The expected result is Negative.  Fact Sheet for Patients:  EntrepreneurPulse.com.au  Fact Sheet for Healthcare Providers:  IncredibleEmployment.be  This test is no t yet approved or cleared by the [place] FDA and  has been authorized for detection and/or diagnosis of SARS-CoV-2 by FDA under an Emergency Use Authorization (EUA). This EUA will remain  in effect (meaning this test can be used) for the duration of the COVID-19 declaration under Section 564(b)(1) of the Act, 21 U.S.C.section 360bbb-3(b)(1), unless the authorization is terminated  or revoked sooner.       Influenza A by PCR NEGATIVE NEGATIVE Final   Influenza B by PCR NEGATIVE NEGATIVE Final    Comment: (NOTE) The Xpert Xpress SARS-CoV-2/FLU/RSV plus assay is intended as an aid in the diagnosis of influenza from Nasopharyngeal swab specimens and should not be used as a sole basis for treatment. Nasal washings and aspirates are unacceptable for Xpert Xpress SARS-CoV-2/FLU/RSV testing.  Fact Sheet for Patients: EntrepreneurPulse.com.au  Fact Sheet for Healthcare Providers: IncredibleEmployment.be  This test is not yet approved or cleared by the [place] FDA and has been authorized for detection and/or diagnosis of SARS-CoV-2 by FDA under an Emergency Use Authorization (EUA). This EUA will remain in effect (meaning this test can be used) for the duration of the COVID-19 declaration under Section 564(b)(1) of the Act, 21 U.S.C. section 360bbb-3(b)(1), unless the authorization is terminated or revoked.  Performed at [facility], [address], [place], [place] [address]   Urine  Culture     Status: Abnormal   Collection Time: 11/27/20  2:15 PM   Specimen: Urine, Clean Catch  Result Value Ref Range Status   Specimen Description   Final    URINE, CLEAN CATCH Performed at [facility], [address] [place], [place] [address]    Special Requests   Final    NONE Performed at [facility], [place],  [address]    Culture 20,000 COLONIES/mL ENTEROBACTER AEROGENES (A)  Final   Report Status 11/30/2020 FINAL  Final   Organism ID, Bacteria ENTEROBACTER AEROGENES (A)  Final      Susceptibility   Enterobacter aerogenes - MIC*    CEFAZOLIN >=64 RESISTANT Resistant     CEFEPIME 1 SENSITIVE Sensitive     CEFTRIAXONE >=64 RESISTANT Resistant     CIPROFLOXACIN <=0.25 SENSITIVE Sensitive     GENTAMICIN <=1 SENSITIVE Sensitive     IMIPENEM <=0.25 SENSITIVE Sensitive     NITROFURANTOIN 64 INTERMEDIATE Intermediate     TRIMETH/SULFA <=20 SENSITIVE Sensitive     PIP/TAZO >=128 RESISTANT Resistant     * 20,000 COLONIES/mL  ENTEROBACTER AEROGENES  CULTURE, BLOOD (ROUTINE X 2) w Reflex to ID Panel     Status: None (Preliminary result)   Collection Time: 11/27/20  8:23 PM   Specimen: BLOOD  Result Value Ref Range Status   Specimen Description BLOOD LAC  Final   Special Requests   Final    BOTTLES DRAWN AEROBIC AND ANAEROBIC Blood Culture results may not be optimal due to an inadequate volume of blood received in culture bottles   Culture   Final    NO GROWTH 4 DAYS Performed at [facility], [address], [place], [place] [address]    Report Status PENDING  Incomplete  CULTURE, BLOOD (ROUTINE X 2) w Reflex to ID Panel     Status: None (Preliminary result)   Collection Time: 11/27/20  9:32 PM   Specimen: BLOOD LEFT HAND  Result Value Ref Range Status   Specimen Description BLOOD LEFT HAND  Final   Special Requests   Final    BOTTLES DRAWN AEROBIC AND ANAEROBIC Blood Culture adequate volume   Culture   Final    NO  GROWTH 4 DAYS Performed at [facility], [address] [place], [place] [address]    Report Status PENDING  Incomplete    Medications: Scheduled Meds:  acetaminophen  1,000 mg Oral Once   Chlorhexidine Gluconate Cloth  6 each Topical Daily   ciprofloxacin  250 mg Oral BID   diltiazem  120 mg Oral Daily   feeding supplement  1 Container Oral TID BM   hydrALAZINE  50 mg Oral Q8H   insulin aspart  0-15 Units Subcutaneous TID WC   insulin glargine-yfgn  10 Units Subcutaneous Daily   levothyroxine  100 mcg Oral QAC breakfast   magnesium oxide  800 mg Oral BID   mouth rinse  15 mL Mouth Rinse q12n4p   mirtazapine  15 mg Oral QHS   multivitamin with minerals  1 tablet Oral Daily   pantoprazole  40 mg Oral Daily   rosuvastatin  10 mg Oral QHS   sodium chloride flush  10-40 mL Intracatheter Q12H   thiamine  100 mg Oral Daily   Continuous Infusions: PRN Meds:.acetaminophen **OR** acetaminophen, albuterol, haloperidol lactate, ondansetron **OR** ondansetron (ZOFRAN) IV, polyethylene glycol, sodium chloride flush  Allergies  Allergen Reactions   Amoxapine    Penicillins     Urinalysis: No results for input(s): COLORURINE, LABSPEC, PHURINE, GLUCOSEU, HGBUR, BILIRUBINUR, KETONESUR, PROTEINUR, UROBILINOGEN, NITRITE, LEUKOCYTESUR in the last 72 hours.  Invalid input(s): APPERANCEUR    Imaging: No results found.    Assessment/Plan:  Tonya Myers is a 55 y.o. female with medical problems of  pneumococcal meningitis bacteremia, epidural abscess February 2022 depression, hypothyroidism, insulin-dependent diabetes type 2, history of lymphoma in remission    was admitted on 11/19/2020 for :  Altered mental status [R41.82] Encephalopathy acute [G93.40] Altered mental status, unspecified altered mental status type [R41.82] Sepsis with encephalopathy without septic shock, due to unspecified organism ([place]) [A41.9, R65.20, G93.40]  #Acute kidney injury Urinalysis from  11/27/2020 suggests proteinuria, greater than 50 RBCs, 21-50 WBCs, microbiology: 20,000 colonies - Enterobacter.  Renal imaging: Renal ultrasound: Negative for mass, hydronephrosis.  Question small ureterocele Patient did have IV contrast exposure for CT angiogram on November 21, 2020 Baseline creatinine of 0.  4 9 from November 22, 2020 Serum creatinine has progressively worsened and is up to 1.86 today. Discussed with patient that this could be contrast-induced ATN and may take some time to get better  although it has been over a week Maintain nutrition.  Avoid hypotension Electrolytes and volume status are acceptable.  No acute indication for dialysis at present. Avoid ACE inhibitor/ARB until serum creatinine improves Patient would benefit from ACE inhibitor in the long-term for proteinuria.  Tonya Myers Candiss Norse 12/01/20

## 2020-12-01 NOTE — Progress Notes (Signed)
PROGRESS NOTE    Tonya Myers  PNT:[id number] DOB: 09-06-1965 DOA: 11/19/2020 PCP: Tonya Pink, MD   Brief Narrative:  55 y.o. female with medical history significant for recent pneumococcal meningitis and bacteremia, depression, hypothyroid, IDDM2, presents to the ED for chief concern of altered mentation.   At bedside, she had expressive aphasia, right upper and lower extremity weakness. Confusion today per spouse. Can walk with a walker and assistance. Jumbled words. She has baseline weakness in the right arm compared to left arm.    Spouse reports that patient had poor PO intake for more than a week. Spouse endorses vomiting and dnies blood and black coffee ground substances.   Patient has been unable to tolerate the MRI due to severe claustrophobia.  Will attempt again with help of IV Ativan.  Lumbar puncture was performed, initial results not terribly impressive.  Not overtly concerning for meningitis.  Remains on broad-spectrum IV antibiotics and IV acyclovir.  Infectious disease consulted.  Concern for possible manifestation of underlying hematologic malignancy.  I have requested consultation from oncology.  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  She sees oncology only once yearly.  Patient was evaluated by oncology.  Very unlikely that current presentation is a manifestation of underlying CLL.  No clear infectious signs.  All cultures remain negative.  LP is inconsistent with meningitis.  CT head chest abdomen pelvis significant surprisingly for pancreatitis.  Lipase is elevated.  Patient has been responding to treatment for pancreatitis including bowel rest and IV fluids.  Oral thrush is responding to treatment.  Mental status remains poor.  Appetite remains poor.  When inquired about why she is not eating she denies throat or abdominal pain and states just poor appetite.  8/2: Patient remains altered and encephalopathic.  Etiology at this point has  been unclear.  I requested consultation from neurology.  Possible vitamin deficiency.  Recommending high-dose IV thiamine.   Assessment & Plan:   Principal Problem:   Subacute delirium Active Problems:   DDD (degenerative disc disease), cervical   Aphasia   Anemia of chronic disease   Acute focal neurological deficit   Encephalitis   Altered mental status   Encephalopathy acute   Fever, recurrent Hx of epidural and paraspinal abscess --intermittent fever to 103 and 102 --repeat covid neg.  No cough or dyspnea to suggest PNA.  Procal increased. --MRI brain, spine 8/5, no obvious infection --UA pos for 20,000 COLONIES/mL ENTEROBACTER AEROGENES.  Started on Cipro, per ID. Plan: --cont Cipro   Acute pancreatitis Unclear etiology No reported history of alcohol intake, no radiographic evidence of gallstones Possible drug-induced however unclear exactly which drug could have driven this Initial abdominal examination negative for tenderness On interview patient does endorse left upper quadrant tenderness Lipase trending down with IVF. Triglycerides negative Plan: --consider repeat CT a/p if fever recurs   Acute toxic metabolic encephalopathy, resolved Unclear etiology --meningitis ruled out by LP.  CT head neg for new stroke.  Hematology ruled out CLL as the culprit.  No seizure on EEG. --mental status dramatically improved with treatment of pancreatitis and IV thiamine repletion. --MRI brain, no acute finding --cont thiamine  Pancytopenia History of CLL All 3 cell lines acutely depressed Oncology consulted Patient's CLL has been in remission since 2016 Recurrence of hematologic note malignancy felt unlikely --s/p 1u pRBC 8/5 for Hgb 7.1 Plan: --monitor Hgb and transfuse to keep Hgb >7  AKI --baseline ~0.7.  Urine appeared very dark.   --  Cr trending up despite MIVF.  BUN remained low.  Cr up to 1.86 this morning. Plan: --nephrology consult today --renal US --urine  studies  Marked hypokalemia --monitor and replete PRN  Hypomag --replete with IV mag  Mouth pain and Oral thrush, resolved Patient with clinical indications of thrush Associated with difficulty swallowing Seems to be improving Patient states poor p.o. intake is driven by appetite rather than pain --s/p Diflucan for 3 doses  Insulin-dependent diabetes mellitus Hypoglycemic episodes Patient is on long-acting formulation 50 units daily at home in addition to metformin --A1c 6.5  --hypoglycemia due to q4h SSI Plan: --cont glargine 10u daily --SSI  Hyperlipidemia --cont statin  Hypothyroid --cont Synthroid  HTN --cont cardizem and hydralazine   DVT prophylaxis: TED hose Code Status: Full code Family Communication: husband updated at bedside today.   Disposition Plan: Status is: Inpatient  Remains inpatient appropriate because:Inpatient level of care appropriate due to severity of illness  Dispo: The patient is from: Home              Anticipated d/c is to: Home (pt again refused [name]).              Patient currently is not medically stable to d/c.  AKI not improving.   Difficult to place patient No   Level of care: Med-Surg  Consultants:  ID Oncology Neurology  Procedures:  Lumbar puncture 7/28  Antimicrobials:    Subjective: Had another fever to 101.    No dysuria, no abdominal pain.     Objective: Vitals:   11/30/20 2359 12/01/20 0435 12/01/20 0711 12/01/20 1214  BP: 136/71 140/64 116/70 (!) 129/59  Pulse: 91 77 84 85  Resp: 16 16 20 20   Temp: 99.8 F (37.7 C) 98.6 F (37 C) 98.6 F (37 C) 100 F (37.8 C)  TempSrc: Oral Oral  Oral  SpO2: 98% 93% 96% 98%  Weight:      Height:        Intake/Output Summary (Last 24 hours) at 12/01/2020 1511 Last data filed at 12/01/2020 1356 Gross per 24 hour  Intake 240 ml  Output 475 ml  Net -235 ml   Filed Weights   11/20/20 0507  Weight: 83 kg    Examination:  Constitutional: NAD, AAOx3, lying  in bed HEENT: conjunctivae and lids normal, EOMI CV: No cyanosis.   RESP: normal respiratory effort, on RA Extremities: No effusions, edema in BLE SKIN: warm, dry Neuro: II - XII grossly intact.   Psych: grouchy mood and affect.      Data Reviewed: I have personally reviewed following labs and imaging studies  CBC: Recent Labs  Lab 11/25/20 0611 11/26/20 0451 11/27/20 0458 11/28/20 0455 11/29/20 0419 11/30/20 0503 12/01/20 0620  WBC 8.7 7.2 5.8 4.1 5.4 4.9 4.5  NEUTROABS 7.3 5.8  --   --   --   --   --   HGB 9.3* 7.8* 8.2* 7.1* 7.6* 7.7* 7.5*  HCT 28.4* 24.4* 25.8* 22.4* 22.8* 24.3* 22.3*  MCV 83.0 83.3 84.0 83.9 82.6 81.8 83.2  PLT 65* 75* 90* 87* 79* 73* 65*   Basic Metabolic Panel: Recent Labs  Lab 11/26/20 0451 11/27/20 0458 11/28/20 0455 11/29/20 0419 11/30/20 0503 12/01/20 0620  NA 135 136 136 137 136 136  K 3.7 3.5 3.4* 4.0 3.6 3.9  CL 107 106 108 110 111 109  CO2 24 24 25 23 23  20*  GLUCOSE 68* 84 165* 91 161* 148*  BUN 9 8 12  12  14 15  CREATININE 0.89 1.00 1.35* 1.38* 1.54* 1.86*  CALCIUM 7.5* 7.8* 7.5* 7.2* 7.3* 7.5*  MG 1.5* 1.5* 1.6* 2.4 2.2 1.8  PHOS 3.2 3.1 3.2  --   --   --    GFR: Estimated Creatinine Clearance: 39 mL/min (A) (by C-G formula based on SCr of 1.86 mg/dL (H)). Liver Function Tests: Recent Labs  Lab 11/28/20 0455 11/30/20 0503  AST 45* 58*  ALT 6 7  ALKPHOS 71 74  BILITOT 0.4 0.5  PROT 5.3* 5.2*  ALBUMIN 2.0* 1.9*   Recent Labs  Lab 11/27/20 0458 11/28/20 0455 11/29/20 0419 11/30/20 0503 12/01/20 0620  LIPASE 305* 387* 209* 306* 270*   No results for input(s): AMMONIA in the last 168 hours.  Coagulation Profile: No results for input(s): INR, PROTIME in the last 168 hours.  Cardiac Enzymes: No results for input(s): CKTOTAL, CKMB, CKMBINDEX, TROPONINI in the last 168 hours. BNP (last 3 results) No results for input(s): PROBNP in the last 8760 hours. HbA1C: No results for input(s): HGBA1C in the last 72  hours. CBG: Recent Labs  Lab 11/30/20 1150 11/30/20 1614 11/30/20 2022 12/01/20 0712 12/01/20 1208  GLUCAP 192* 178* 239* 137* 164*   Lipid Profile: No results for input(s): CHOL, HDL, LDLCALC, TRIG, CHOLHDL, LDLDIRECT in the last 72 hours.  Thyroid Function Tests: No results for input(s): TSH, T4TOTAL, FREET4, T3FREE, THYROIDAB in the last 72 hours.   Anemia Panel: Recent Labs    11/30/20 0503  FERRITIN 690*    Sepsis Labs: Recent Labs  Lab 11/27/20 0458 11/28/20 0455 11/29/20 0419  PROCALCITON 1.79 2.07 1.80    Recent Results (from the past 240 hour(s))  Resp Panel by RT-PCR (Flu A&B, Covid) Nasopharyngeal Swab     Status: None   Collection Time: 11/27/20  1:39 PM   Specimen: Nasopharyngeal Swab; Nasopharyngeal(NP) swabs in vial transport medium  Result Value Ref Range Status   SARS Coronavirus 2 by RT PCR NEGATIVE NEGATIVE Final    Comment: (NOTE) SARS-CoV-2 target nucleic acids are NOT DETECTED.  The SARS-CoV-2 RNA is generally detectable in upper respiratory specimens during the acute phase of infection. The lowest concentration of SARS-CoV-2 viral copies this assay can detect is 138 copies/mL. A negative result does not preclude SARS-Cov-2 infection and should not be used as the sole basis for treatment or other patient management decisions. A negative result may occur with  improper specimen collection/handling, submission of specimen other than nasopharyngeal swab, presence of viral mutation(s) within the areas targeted by this assay, and inadequate number of viral copies(<138 copies/mL). A negative result must be combined with clinical observations, patient history, and epidemiological information. The expected result is Negative.  Fact Sheet for Patients:  EntrepreneurPulse.com.au  Fact Sheet for Healthcare Providers:  IncredibleEmployment.be  This test is no t yet approved or cleared by the [place] FDA  and  has been authorized for detection and/or diagnosis of SARS-CoV-2 by FDA under an Emergency Use Authorization (EUA). This EUA will remain  in effect (meaning this test can be used) for the duration of the COVID-19 declaration under Section 564(b)(1) of the Act, 21 U.S.C.section 360bbb-3(b)(1), unless the authorization is terminated  or revoked sooner.       Influenza A by PCR NEGATIVE NEGATIVE Final   Influenza B by PCR NEGATIVE NEGATIVE Final    Comment: (NOTE) The Xpert Xpress SARS-CoV-2/FLU/RSV plus assay is intended as an aid in the diagnosis of influenza from Nasopharyngeal swab specimens and should not be  used as a sole basis for treatment. Nasal washings and aspirates are unacceptable for Xpert Xpress SARS-CoV-2/FLU/RSV testing.  Fact Sheet for Patients: EntrepreneurPulse.com.au  Fact Sheet for Healthcare Providers: IncredibleEmployment.be  This test is not yet approved or cleared by the [place] FDA and has been authorized for detection and/or diagnosis of SARS-CoV-2 by FDA under an Emergency Use Authorization (EUA). This EUA will remain in effect (meaning this test can be used) for the duration of the COVID-19 declaration under Section 564(b)(1) of the Act, 21 U.S.C. section 360bbb-3(b)(1), unless the authorization is terminated or revoked.  Performed at [facility], [address] [place], [place] [address]   Urine Culture     Status: Abnormal   Collection Time: 11/27/20  2:15 PM   Specimen: Urine, Clean Catch  Result Value Ref Range Status   Specimen Description   Final    URINE, CLEAN CATCH Performed at [facility], [address], [place], [place] [address]    Special Requests   Final    NONE Performed at [facility], [place], [place] [address]    Culture 20,000 COLONIES/mL ENTEROBACTER AEROGENES (A)  Final   Report Status 11/30/2020 FINAL  Final   Organism ID,  Bacteria ENTEROBACTER AEROGENES (A)  Final      Susceptibility   Enterobacter aerogenes - MIC*    CEFAZOLIN >=64 RESISTANT Resistant     CEFEPIME 1 SENSITIVE Sensitive     CEFTRIAXONE >=64 RESISTANT Resistant     CIPROFLOXACIN <=0.25 SENSITIVE Sensitive     GENTAMICIN <=1 SENSITIVE Sensitive     IMIPENEM <=0.25 SENSITIVE Sensitive     NITROFURANTOIN 64 INTERMEDIATE Intermediate     TRIMETH/SULFA <=20 SENSITIVE Sensitive     PIP/TAZO >=128 RESISTANT Resistant     * 20,000 COLONIES/mL ENTEROBACTER AEROGENES  CULTURE, BLOOD (ROUTINE X 2) w Reflex to ID Panel     Status: None (Preliminary result)   Collection Time: 11/27/20  8:23 PM   Specimen: BLOOD  Result Value Ref Range Status   Specimen Description BLOOD LAC  Final   Special Requests   Final    BOTTLES DRAWN AEROBIC AND ANAEROBIC Blood Culture results may not be optimal due to an inadequate volume of blood received in culture bottles   Culture   Final    NO GROWTH 4 DAYS Performed at [facility], [address] [place], [place] [address]    Report Status PENDING  Incomplete  CULTURE, BLOOD (ROUTINE X 2) w Reflex to ID Panel     Status: None (Preliminary result)   Collection Time: 11/27/20  9:32 PM   Specimen: BLOOD LEFT HAND  Result Value Ref Range Status   Specimen Description BLOOD LEFT HAND  Final   Special Requests   Final    BOTTLES DRAWN AEROBIC AND ANAEROBIC Blood Culture adequate volume   Culture   Final    NO GROWTH 4 DAYS Performed at [facility], [address], [place], [place] [address]    Report Status PENDING  Incomplete         Radiology Studies: No results found.      Scheduled Meds:  acetaminophen  1,000 mg Oral Once   Chlorhexidine Gluconate Cloth  6 each Topical Daily   ciprofloxacin  250 mg Oral BID   diltiazem  120 mg Oral Daily   feeding supplement  1 Container Oral TID BM   hydrALAZINE  50 mg Oral Q8H   insulin aspart  0-15 Units Subcutaneous TID WC   insulin  glargine-yfgn  10 Units Subcutaneous Daily   levothyroxine  100 mcg Oral QAC breakfast   magnesium oxide  800 mg Oral BID   mouth rinse  15 mL Mouth Rinse q12n4p   mirtazapine  15 mg Oral QHS   multivitamin with minerals  1 tablet Oral Daily   pantoprazole  40 mg Oral Daily   rosuvastatin  10 mg Oral QHS   sodium chloride flush  10-40 mL Intracatheter Q12H   thiamine  100 mg Oral Daily   Continuous Infusions:     LOS: 11 days     Enzo Bi, MD [facility] Pager [phone]  If 7PM-7AM, please contact night-coverage 12/01/2020, 3:11 PM

## 2020-12-01 NOTE — Progress Notes (Signed)
Physical Therapy Treatment Patient Details Name: Tonya Myers MRN: [id number] DOB: 12-Apr-1966 Today's Date: 12/01/2020    History of Present Illness Pt admitted to [facility]/19[facility]20 for decreased appetite, vomiting, AMS, and lethargy. On EMS assessment, pt was found to have fever 102 and AMS with confusion and left sided weakness.  Found to have pancytopenia, oncology was consulted due to hx of CLL, but so far no concern of CLL relapse. ID also on board, initially concerning for meningitis due to hx of pneumococcal meningitis. PMH significant for: DDD, cervical radiculopathy, anxiety, DM, HTN, hypothyroidism, CLL, asthma, cLBP, vertigo, and hx of stroke.    PT Comments    Co-tx with OT for pt and staff safety.  1 unit billed each per protocol.  Pt to EOB with min a x 1.  OT assisted/directed ADL tasks.  She is able to stand x 2 with min a x 2 to RW for up to 1 min 20 seconds max.  She is generally steady but does demonstrate overall weakness.  She is able to step to reposition feet but does not want to progress to gait or standing ex at this time.  Returns to supine with min a x 1 for LE's.  Pt continues to decline [name].  HHPT and services as available would be appropriate as she continues to choose to return home.    Stated she wishes to return home today and is awaiting MD.   Follow Up Recommendations  [name];Other (comment)     Equipment Recommendations  Hospital bed;Other (comment)    Recommendations for Other Services       Precautions / Restrictions Precautions Precautions: Fall Restrictions Weight Bearing Restrictions: No    Mobility  Bed Mobility Overal bed mobility: Needs Assistance Bed Mobility: Supine to Sit;Sit to Supine     Supine to sit: Min guard;HOB elevated Sit to supine: Min assist   General bed mobility comments: MIN A for advancement of RLE    Transfers Overall transfer level: Needs assistance Equipment used: Rolling walker (2 wheeled) Transfers: Sit  to/from Stand Sit to Stand: Mod assist;Min assist;+2 physical assistance;From elevated surface         General transfer comment: x3 sit<>stand bouts, requiring MIN A+2 to MOD A+2 to clear hips from elevated bed  Ambulation/Gait         Gait velocity: decreased   General Gait Details: did not want to try gait - standing only but did take a couple steps to reposition while standing   Stairs             Wheelchair Mobility    Modified Rankin (Stroke Patients Only)       Balance Overall balance assessment: Needs assistance Sitting-balance support: No upper extremity supported;Feet supported Sitting balance-Leahy Scale: Good Sitting balance - Comments: Good sitting balance reaching outside BOS to don shoes. Requires MIN GUARD only d/t dizziness with change in head position   Standing balance support: Bilateral upper extremity supported;During functional activity Standing balance-Leahy Scale: Poor Standing balance comment: Required MIN assist+2 due to balance/impulsivity/poor safety awareness                            Cognition Arousal/Alertness: Awake/alert Behavior During Therapy: WFL for tasks assessed/performed Overall Cognitive Status: Impaired/Different from baseline  General Comments: Able to follow 2-step commands with increased time for processing of task.      Exercises Other Exercises Other Exercises: stood x 3 max 1 min 20 sec    General Comments        Pertinent Vitals/Pain Pain Assessment: Faces Faces Pain Scale: Hurts a little bit Pain Location: back Pain Descriptors / Indicators: Aching Pain Intervention(s): Limited activity within patient's tolerance;Monitored during session;Repositioned    Home Living                      Prior Function            PT Goals (current goals can now be found in the care plan section) Acute Rehab PT Goals Patient Stated Goal: to get  stronger Progress towards PT goals: Progressing toward goals    Frequency    7X/week      PT Plan Current plan remains appropriate    Co-evaluation PT/OT/SLP Co-Evaluation/Treatment: Yes Reason for Co-Treatment: Complexity of the patient's impairments (multi-system involvement) PT goals addressed during session: Mobility/safety with mobility OT goals addressed during session: ADL's and self-care      AM-PAC PT "6 Clicks" Mobility   Outcome Measure  Help needed turning from your back to your side while in a flat bed without using bedrails?: A Little Help needed moving from lying on your back to sitting on the side of a flat bed without using bedrails?: A Lot Help needed moving to and from a bed to a chair (including a wheelchair)?: A Lot Help needed standing up from a chair using your arms (e.g., wheelchair or bedside chair)?: A Lot Help needed to walk in hospital room?: A Lot Help needed climbing 3-5 steps with a railing? : Total 6 Click Score: 12    End of Session Equipment Utilized During Treatment: Gait belt Activity Tolerance: Patient tolerated treatment well;Other (comment) Patient left: in bed;with bed alarm set;with family/visitor present;with call bell/phone within reach Nurse Communication: Mobility status PT Visit Diagnosis: Unsteadiness on feet (R26.81);Muscle weakness (generalized) (M62.81);Difficulty in walking, not elsewhere classified (R26.2);Hemiplegia and hemiparesis Hemiplegia - Right/Left: Right     Time: 9735-3299 PT Time Calculation (min) (ACUTE ONLY): 27 min  Charges:  $Therapeutic Activity: 8-22 mins                    Chesley Noon, PTA 12/01/20, 11:16 AM , 11:13 AM

## 2020-12-01 NOTE — TOC Progression Note (Signed)
Transition of Care [facility]) - Progression Note    Patient Details  Name: Tonya Myers MRN: [id number] Date of Birth: [birth year]  Transition of Care [facility]) [name] Contact  Shelbie Hutching, RN Phone Number: 12/01/2020, 2:31 PM  Clinical Narrative:    Kidney function is not improved.  Patient not yet medically cleared for discharge.  TOC will cont to follow.     Expected Discharge Plan: [place] Barriers to Discharge: Continued Medical Work up  Expected Discharge Plan and Services Expected Discharge Plan: [place]   Discharge Planning Services: CM Consult Post Acute Care Choice: [place], [name] Living arrangements for the past 2 months: Single Family Home                 DME Arranged: N/A DME Agency: NA       HH Arranged: PT, OT [place] Agency: [place] ([facility]) Date HH Agency Contacted: 11/24/20 Time [place]: 1419 Representative spoke with at [place]: [place] ([place]) Interventions    Readmission Risk Interventions Readmission Risk Prevention Plan 11/24/2020  Transportation Screening Complete  PCP or Specialist Appt within 3-5 Days Complete  HRI or [employer] Complete  Social Work Consult for [place] Planning/Counseling Complete  Palliative Care Screening Not Applicable  Medication Review [employer]) Complete  Some recent data might be hidden

## 2020-12-01 NOTE — Progress Notes (Signed)
Occupational Therapy Treatment Patient Details Name: Aanchal Cope MRN: [id number] DOB: November 07, 1965 Today's Date: 12/01/2020    History of present illness Pt admitted to [facility] on 11/19/20 for decreased appetite, vomiting, AMS, and lethargy. On EMS assessment, pt was found to have fever 102 and AMS with confusion and left sided weakness.  Found to have pancytopenia, oncology was consulted due to hx of CLL, but so far no concern of CLL relapse. ID also on board, initially concerning for meningitis due to hx of pneumococcal meningitis. PMH significant for: DDD, cervical radiculopathy, anxiety, DM, HTN, hypothyroidism, CLL, asthma, cLBP, vertigo, and hx of stroke.   OT comments  Pt seen for OT treatment on this date. OT/PT co-treatment this date to address functional/ADL transfers. Upon arrival to room, pt awake in bed with husband and neighbor present. Pt agreeable to tx. With HOB elevated, pt able to perform supine>sit transfer with MIN GUARD + increased time/effort/use of bedrails. While seated EOB, pt able to reach outside BOS to don shoes, requiring only MIN A to assist with adjusting R shoe for heel placement and for tying shoe laces. This date, pt able to perform x3 sit<>strand transfers with RW, requiring MIN A+2 to MOD A+2 to clear hips from elevated bed. While standing with MIN A+2 and unilateral UE support from RW, pt able to bring cup to mouth and drink via straw with no LOB observed. Pt is making good progress toward goals and continues to benefit from skilled OT services to maximize return to PLOF and minimize risk of future falls, injury, caregiver burden, and readmission. At this time, pt continues to required +2 assistance for OOB mobility, and would benefit from STR before return to home, however family and pt stating preference to return home. If pt returns home, recommend [facility].     Follow Up Recommendations  [name]    Equipment Recommendations   (defer to next venue of care)        Precautions / Restrictions Precautions Precautions: Fall Restrictions Weight Bearing Restrictions: No       Mobility Bed Mobility Overal bed mobility: Needs Assistance Bed Mobility: Supine to Sit;Sit to Supine     Supine to sit: Min guard;HOB elevated Sit to supine: Min assist   General bed mobility comments: MIN A for advancement of RLE    Transfers Overall transfer level: Needs assistance Equipment used: Rolling walker (2 wheeled) Transfers: Sit to/from Stand Sit to Stand: Mod assist;Min assist;+2 physical assistance;From elevated surface         General transfer comment: x3 sit<>stand bouts, requiring MIN A+2 to MOD A+2 to clear hips from elevated bed    Balance Overall balance assessment: Needs assistance Sitting-balance support: No upper extremity supported;Feet supported Sitting balance-Leahy Scale: Good Sitting balance - Comments: Good sitting balance reaching outside BOS to don shoes. Requires MIN GUARD only d/t dizziness with change in head position   Standing balance support: Bilateral upper extremity supported;During functional activity Standing balance-Leahy Scale: Poor Standing balance comment: Required MIN assist+2 due to balance/impulsivity/poor safety awareness                           ADL either performed or assessed with clinical judgement   ADL Overall ADL's : Needs assistance/impaired Eating/Feeding: Minimal assistance Eating/Feeding Details (indicate cue type and reason): MIN A to maintain standing balance while drinking from cup. Requires verbal cues for taking small controlled sips Grooming: Brushing hair;Moderate assistance;Sitting Grooming Details (indicate cue type and  reason): To don scarf around head, with pt becoming fatigued quickly and requiring MOD A to complete             Lower Body Dressing: Minimal assistance;Sitting/lateral leans Lower Body Dressing Details (indicate cue type and reason): to don/doff shoes while  sitting EOB. MIN A required to assist with adjusting R shoe for heel placement and for tying shoe laces             Functional mobility during ADLs: Moderate assistance;+2 for physical assistance;Rolling walker        Cognition Arousal/Alertness: Awake/alert Behavior During Therapy: WFL for tasks assessed/performed Overall Cognitive Status: Impaired/Different from baseline                                 General Comments: Able to follow 2-step commands with increased time for processing of task.                   Pertinent Vitals/ Pain       Pain Assessment: Faces Faces Pain Scale: Hurts a little bit Pain Location: back Pain Descriptors / Indicators: Aching Pain Intervention(s): Limited activity within patient's tolerance;Monitored during session;Repositioned         Frequency  Min 3X/week        Progress Toward Goals  OT Goals(current goals can now be found in the care plan section)  Progress towards OT goals: Progressing toward goals  Acute Rehab OT Goals Patient Stated Goal: to get stronger OT Goal Formulation: With patient/family Time For Goal Achievement: 12/10/20 Potential to Achieve Goals: Good  Plan Discharge plan remains appropriate;Frequency remains appropriate    Co-evaluation    PT/OT/SLP Co-Evaluation/Treatment: Yes Reason for Co-Treatment: For patient/therapist safety;To address functional/ADL transfers PT goals addressed during session: Mobility/safety with mobility;Balance OT goals addressed during session: ADL's and self-care      AM-PAC OT "6 Clicks" Daily Activity     Outcome Measure   Help from another person eating meals?: A Little Help from another person taking care of personal grooming?: A Little Help from another person toileting, which includes using toliet, bedpan, or urinal?: A Lot Help from another person bathing (including washing, rinsing, drying)?: A Lot Help from another person to put on and taking  off regular upper body clothing?: A Little Help from another person to put on and taking off regular lower body clothing?: A Lot 6 Click Score: 15    End of Session Equipment Utilized During Treatment: Gait belt;Rolling walker  OT Visit Diagnosis: Muscle weakness (generalized) (M62.81);Other symptoms and signs involving cognitive function   Activity Tolerance Patient tolerated treatment well   Patient Left in bed;with call bell/phone within reach;with bed alarm set;with family/visitor present   Nurse Communication Mobility status        Time: 5176-1607 OT Time Calculation (min): 29 min  Charges: OT General Charges $OT Visit: 1 Visit OT Treatments $Self Care/Home Management : 8-22 mins  Fredirick Maudlin, OTR/L [place]

## 2020-12-02 DIAGNOSIS — R509 Fever, unspecified: Secondary | ICD-10-CM | POA: Diagnosis not present

## 2020-12-02 DIAGNOSIS — R410 Disorientation, unspecified: Secondary | ICD-10-CM | POA: Diagnosis not present

## 2020-12-02 LAB — BPAM RBC
Blood Product Expiration Date: 202208122359
Blood Product Expiration Date: 202208162359
Blood Product Expiration Date: 202209022359
ISSUE DATE / TIME: 202208052024
Unit Type and Rh: 5100
Unit Type and Rh: 9500
Unit Type and Rh: 9500

## 2020-12-02 LAB — CBC WITH DIFFERENTIAL/PLATELET
Abs Immature Granulocytes: 0.02 10*3/uL (ref 0.00–0.07)
Basophils Absolute: 0 10*3/uL (ref 0.0–0.1)
Basophils Relative: 1 %
Eosinophils Absolute: 0.1 10*3/uL (ref 0.0–0.5)
Eosinophils Relative: 1 %
HCT: 22.6 % — ABNORMAL LOW (ref 36.0–46.0)
Hemoglobin: 7.4 g/dL — ABNORMAL LOW (ref 12.0–15.0)
Immature Granulocytes: 1 %
Lymphocytes Relative: 19 %
Lymphs Abs: 0.8 10*3/uL (ref 0.7–4.0)
MCH: 27.1 pg (ref 26.0–34.0)
MCHC: 32.7 g/dL (ref 30.0–36.0)
MCV: 82.8 fL (ref 80.0–100.0)
Monocytes Absolute: 0.2 10*3/uL (ref 0.1–1.0)
Monocytes Relative: 4 %
Neutro Abs: 3.1 10*3/uL (ref 1.7–7.7)
Neutrophils Relative %: 74 %
Platelets: 63 10*3/uL — ABNORMAL LOW (ref 150–400)
RBC: 2.73 MIL/uL — ABNORMAL LOW (ref 3.87–5.11)
RDW: 17.5 % — ABNORMAL HIGH (ref 11.5–15.5)
Smear Review: NORMAL
WBC: 4.2 10*3/uL (ref 4.0–10.5)
nRBC: 0 % (ref 0.0–0.2)

## 2020-12-02 LAB — TYPE AND SCREEN
ABO/RH(D): O POS
Antibody Screen: POSITIVE
DAT, IgG: POSITIVE
DAT, complement: NEGATIVE
Unit division: 0
Unit division: 0
Unit division: 0

## 2020-12-02 LAB — NA AND K (SODIUM & POTASSIUM), RAND UR
Potassium Urine: 32 mmol/L
Sodium, Ur: 69 mmol/L

## 2020-12-02 LAB — CULTURE, BLOOD (ROUTINE X 2)
Culture: NO GROWTH
Culture: NO GROWTH
Special Requests: ADEQUATE

## 2020-12-02 LAB — BASIC METABOLIC PANEL WITH GFR
Anion gap: 5 (ref 5–15)
BUN: 16 mg/dL (ref 6–20)
CO2: 21 mmol/L — ABNORMAL LOW (ref 22–32)
Calcium: 7.8 mg/dL — ABNORMAL LOW (ref 8.9–10.3)
Chloride: 111 mmol/L (ref 98–111)
Creatinine, Ser: 2.06 mg/dL — ABNORMAL HIGH (ref 0.44–1.00)
GFR, Estimated: 28 mL/min — ABNORMAL LOW
Glucose, Bld: 105 mg/dL — ABNORMAL HIGH (ref 70–99)
Potassium: 3.9 mmol/L (ref 3.5–5.1)
Sodium: 137 mmol/L (ref 135–145)

## 2020-12-02 LAB — GLUCOSE, CAPILLARY
Glucose-Capillary: 97 mg/dL (ref 70–99)
Glucose-Capillary: 198 mg/dL — ABNORMAL HIGH (ref 70–99)
Glucose-Capillary: 128 mg/dL — ABNORMAL HIGH (ref 70–99)
Glucose-Capillary: 101 mg/dL — ABNORMAL HIGH (ref 70–99)

## 2020-12-02 LAB — URINALYSIS, COMPLETE (UACMP) WITH MICROSCOPIC
Bilirubin Urine: NEGATIVE
Glucose, UA: NEGATIVE mg/dL
Ketones, ur: NEGATIVE mg/dL
Leukocytes,Ua: NEGATIVE
Nitrite: NEGATIVE
Protein, ur: 100 mg/dL — AB
RBC / HPF: >50 RBC/hpf — ABNORMAL HIGH (ref 0–5)
Specific Gravity, Urine: 1.009 (ref 1.005–1.030)
pH: 7 (ref 5.0–8.0)

## 2020-12-02 LAB — MAGNESIUM: Magnesium: 2 mg/dL (ref 1.7–2.4)

## 2020-12-02 LAB — PROTEIN / CREATININE RATIO, URINE
Creatinine, Urine: 72 mg/dL
Protein Creatinine Ratio: 1.6 mg/mg{Cre} — ABNORMAL HIGH (ref 0.00–0.15)
Total Protein, Urine: 115 mg/dL

## 2020-12-02 LAB — PROCALCITONIN: Procalcitonin: 1.74 ng/mL

## 2020-12-02 LAB — LIPASE, BLOOD: Lipase: 227 U/L — ABNORMAL HIGH (ref 11–51)

## 2020-12-02 MED ORDER — SODIUM CHLORIDE 0.9 % IV SOLN: INTRAVENOUS | Status: DC

## 2020-12-02 MED ORDER — SODIUM CHLORIDE 0.9 % IV SOLN: INTRAVENOUS | Status: AC

## 2020-12-02 NOTE — Progress Notes (Signed)
Occupational Therapy Treatment Patient Details Name: Tonya Myers MRN: [id number] DOB: 07-13-1965 Today's Date: 12/02/2020    History of present illness Pt admitted to [facility] on 11/19/20 for decreased appetite, vomiting, AMS, and lethargy. On EMS assessment, pt was found to have fever 102 and AMS with confusion and left sided weakness.  Found to have pancytopenia, oncology was consulted due to hx of CLL, but so far no concern of CLL relapse. ID also on board, initially concerning for meningitis due to hx of pneumococcal meningitis. PMH significant for: DDD, cervical radiculopathy, anxiety, DM, HTN, hypothyroidism, CLL, asthma, cLBP, vertigo, and hx of stroke.   OT comments  Upon entering the room, pt supine in bed with husband present in the room. Pt is agreeable to OT intervention. OT setting up room and notices that pt has had large BM in bed and unaware. Pt rolling L <>R with min A and total A for hygiene. Pt agreeable to continue with session and sits on EOB with close supervision for safety. RN arrives and pt takes medications with multiple drops from L hand while taking pills one by one. Pt donning B shoes with min A to get heels into shoes and secure laces. Pt maintaining sitting balance with LB dressing with close supervision. Initial stand attempted with use of RW but pt with significant posterior bias and pushing against RW. OT demonstrated and educated pt on anterior weight shift. RW removed and therapist standing in front of pt with R knee blocked. Pt standing with mod lifting assistance and min - mod A once standing for balance. Pt began to have BM in standing and returned to bed. Pt placed on bed pan to attempt emptying. Family remains in room and RN notified. Pt continues to benefit from OT intervention and would benefit from intensive rehab program to address functional deficits.    Follow Up Recommendations  [name]    Equipment Recommendations  Other (comment) (defer to next venue of  care)       Precautions / Restrictions Precautions Precautions: Fall Restrictions Weight Bearing Restrictions: No       Mobility Bed Mobility Overal bed mobility: Needs Assistance Bed Mobility: Supine to Sit;Sit to Supine;Rolling Rolling: Min assist   Supine to sit: Min assist Sit to supine: Min assist        Transfers Overall transfer level: Needs assistance Equipment used: Rolling walker (2 wheeled);1 person hand held assist Transfers: Sit to/from Stand Sit to Stand: Mod assist         General transfer comment: Pt with posterior weight shift when attempting stand with RW. Therapist front facing with R knee blocked and pt stands with mod A and facilitation of anterior weight shift.    Balance Overall balance assessment: Needs assistance Sitting-balance support: No upper extremity supported;Feet supported Sitting balance-Leahy Scale: Good     Standing balance support: Bilateral upper extremity supported;During functional activity Standing balance-Leahy Scale: Poor Standing balance comment: mod A for balance and anterior weight shift                           ADL either performed or assessed with clinical judgement   ADL Overall ADL's : Needs assistance/impaired                     Lower Body Dressing: Minimal assistance;Sitting/lateral leans Lower Body Dressing Details (indicate cue type and reason): to don/doff shoes while sitting EOB. MIN A required to assist with  adjusting R shoe for heel placement and for tying shoe laces               General ADL Comments: incontinent of BM requiring total A for hygiene.     Vision Patient Visual Report: No change from baseline            Cognition Arousal/Alertness: Awake/alert Behavior During Therapy: WFL for tasks assessed/performed Overall Cognitive Status: Impaired/Different from baseline                                 General Comments: Pt follows commands with  increased time to process task                   Pertinent Vitals/ Pain       Pain Assessment: No/denies pain         Frequency  Min 3X/week        Progress Toward Goals  OT Goals(current goals can now be found in the care plan section)  Progress towards OT goals: Progressing toward goals  Acute Rehab OT Goals Patient Stated Goal: to get stronger OT Goal Formulation: With patient/family Time For Goal Achievement: 12/10/20 Potential to Achieve Goals: Good  Plan Discharge plan remains appropriate;Frequency remains appropriate       AM-PAC OT "6 Clicks" Daily Activity     Outcome Measure   Help from another person eating meals?: A Little Help from another person taking care of personal grooming?: A Little Help from another person toileting, which includes using toliet, bedpan, or urinal?: A Lot Help from another person bathing (including washing, rinsing, drying)?: A Lot Help from another person to put on and taking off regular upper body clothing?: A Little Help from another person to put on and taking off regular lower body clothing?: A Lot 6 Click Score: 15    End of Session    OT Visit Diagnosis: Muscle weakness (generalized) (M62.81);Other symptoms and signs involving cognitive function   Activity Tolerance Patient tolerated treatment well   Patient Left in bed;with call bell/phone within reach;with bed alarm set;with family/visitor present   Nurse Communication Mobility status        Time: 1030-1108 OT Time Calculation (min): 38 min  Charges: OT General Charges $OT Visit: 1 Visit OT Treatments $Self Care/Home Management : 38-52 mins  Darleen Crocker, MS, OTR/L , CBIS ascom [phone]  12/02/20, 12:39 PM

## 2020-12-02 NOTE — Progress Notes (Signed)
Physical Therapy Treatment Patient Details Name: Tonya Myers MRN: [id number] DOB: 1966/01/21 Today's Date: 12/02/2020    History of Present Illness Pt admitted to [facility] on 11/19/20 for decreased appetite, vomiting, AMS, and lethargy. On EMS assessment, pt was found to have fever 102 and AMS with confusion and left sided weakness.  Found to have pancytopenia, oncology was consulted due to hx of CLL, but so far no concern of CLL relapse. ID also on board, initially concerning for meningitis due to hx of pneumococcal meningitis. PMH significant for: DDD, cervical radiculopathy, anxiety, DM, HTN, hypothyroidism, CLL, asthma, cLBP, vertigo, and hx of stroke.    PT Comments    Pt was long sitting in bed upon arriving. She endorses not feel great but was cooperative and willing to participate. " I've been having loose BMs." Pt was able to participate however severely limited by 3 x Bms during session. She reports not wanting to DC to rehab when time of DC. She has 24/7 assistance at home and is requesting to return home with HHPT. She has all equipment needs met except will need a hoyer lift for days when pt is unable to perform daily ADLs safely. Supportive spouse present and in agreement with current plan. CM informed of family request. Will continue to see daily and progress as able per current POC.    Follow Up Recommendations  [name];Other (comment);Home health PT (Pt does not want [name] at DC. Recommend home with HHPT + hoyer lift.)     Equipment Recommendations  Other (comment) (hoyer lift. pt has hospital bed and all other equipmnt needs met)       Precautions / Restrictions Precautions Precautions: Fall Restrictions Weight Bearing Restrictions: No    Mobility  Bed Mobility Overal bed mobility: Needs Assistance Bed Mobility: Supine to Sit;Sit to Supine;Rolling Rolling: Min assist   Supine to sit: Min assist Sit to supine: Min assist        Transfers Overall transfer level: Needs  assistance Equipment used: Rolling walker (2 wheeled) Transfers: Sit to/from Stand Sit to Stand: Min assist;Mod assist;From elevated surface         General transfer comment: First trial STS required mod assist however progressed to min assist by the end. pt has her own technique that is safe and works well for her. Pt voice how she is uninterested in rehab after acute hospitlization. discussed equipment needs to manage at home. pt states understanding but will need hoyer lift for safety.  Ambulation/Gait    General Gait Details: Did not progress away from EOB 2/2 to multiple loose BMs upon standing.    Balance Overall balance assessment: Needs assistance Sitting-balance support: No upper extremity supported;Feet supported Sitting balance-Leahy Scale: Good     Standing balance support: Bilateral upper extremity supported;During functional activity Standing balance-Leahy Scale: Fair Standing balance comment: once in standing was able to static stand with close supervision      Cognition Arousal/Alertness: Awake/alert Behavior During Therapy: WFL for tasks assessed/performed Overall Cognitive Status: Within Functional Limits for tasks assessed      General Comments: Pt was A and O x 4. Likes to dictate session but is pleasant             Pertinent Vitals/Pain Pain Assessment: No/denies pain Faces Pain Scale: No hurt Pain Intervention(s): Monitored during session     PT Goals (current goals can now be found in the care plan section) Acute Rehab PT Goals Patient Stated Goal: to get stronger at home Progress towards  PT goals: Progressing toward goals    Frequency    7X/week      PT Plan Discharge plan needs to be updated    Co-evaluation     PT goals addressed during session: Mobility/safety with mobility;Balance;Proper use of DME;Strengthening/ROM        AM-PAC PT "6 Clicks" Mobility   Outcome Measure  Help needed turning from your back to your side  while in a flat bed without using bedrails?: A Little Help needed moving from lying on your back to sitting on the side of a flat bed without using bedrails?: A Little Help needed moving to and from a bed to a chair (including a wheelchair)?: A Little Help needed standing up from a chair using your arms (e.g., wheelchair or bedside chair)?: A Lot Help needed to walk in hospital room?: A Lot Help needed climbing 3-5 steps with a railing? : Total 6 Click Score: 14    End of Session Equipment Utilized During Treatment: Gait belt Activity Tolerance: Patient tolerated treatment well Patient left: in bed;with bed alarm set;with family/visitor present;with call bell/phone within reach Nurse Communication: Mobility status PT Visit Diagnosis: Unsteadiness on feet (R26.81);Muscle weakness (generalized) (M62.81);Difficulty in walking, not elsewhere classified (R26.2);Hemiplegia and hemiparesis Hemiplegia - Right/Left: Right     Time: 1345-1430 PT Time Calculation (min) (ACUTE ONLY): 45 min  Charges:  $Therapeutic Activity: 38-52 mins                     Julaine Fusi PTA 12/02/20, 3:50 PM

## 2020-12-02 NOTE — Consult Note (Addendum)
Pharmacy Antibiotic Note  Tonya Myers is a 55 y.o. female admitted on 11/19/2020 with UTI.  Presented to the ED with confusion. During course of hospital stay, has been having intermittent fevers. Pharmacy has been consulted for cipro dosing.  WBC WNL, TMAX 100.41F. Urine culture grew Enterobacter aerogenes. Creatinine trending up today, however CrCl still > 30 mL/min. Patient is also on doxycyline 100 mg IV Q12 hours for history of tick bite. ID is following. On day #3 of ciprofloxacin.   Plan: Continue ciprofloxacin 250mg  q12h. Monitor renal function, WBC, and temperature. F/u LOT.  Height: 6' (182.9 cm) Weight: 94.4 kg (208 lb 1.8 oz) IBW/kg (Calculated) : 73.1  Temp (24hrs), Avg:100 F (37.8 C), Min:99.5 F (37.5 C), Max:100.7 F (38.2 C)  Recent Labs  Lab 11/28/20 0455 11/29/20 0419 11/30/20 0503 12/01/20 0620 12/02/20 0601  WBC 4.1 5.4 4.9 4.5 4.2  CREATININE 1.35* 1.38* 1.54* 1.86* 2.06*     Estimated Creatinine Clearance: 39.3 mL/min (A) (by C-G formula based on SCr of 2.06 mg/dL (H)).    Allergies  Allergen Reactions   Amoxapine    Penicillins     Antimicrobials this admission: Cefepime 7/27 x 1 Vanc/Metronidazole 7/28 x 1 Acyclovir 7/28 > 8/1 Fluconazole 7/29 >> 8/4 Ciprofloxacin 8/7 >>  Dose adjustments this admission: None  Microbiology results: 8/4 BCx: NG x 3d 8/4 UCx: Enterobacter Aerogenes 20K cipro sensitive    Thank you for allowing pharmacy to be a part of this patient's care.   Wynelle Cleveland, PharmD Pharmacy Resident  12/02/2020 7:40 AM

## 2020-12-02 NOTE — Progress Notes (Signed)
PROGRESS NOTE    Tonya Myers  JAS:[id number] DOB: [birth year] DOA: 11/19/2020 PCP: [name], MD   Brief Narrative:  55 y.o. female with medical history significant for recent pneumococcal meningitis and bacteremia, depression, hypothyroid, IDDM2, presents to the ED for chief concern of altered mentation.   At bedside, she had expressive aphasia, right upper and lower extremity weakness. Confusion today per spouse. Can walk with a walker and assistance. Jumbled words. She has baseline weakness in the right arm compared to left arm.    Spouse reports that patient had poor PO intake for more than a week. Spouse endorses vomiting and dnies blood and black coffee ground substances.   Patient has been unable to tolerate the MRI due to severe claustrophobia.  Will attempt again with help of IV Ativan.  Lumbar puncture was performed, initial results not terribly impressive.  Not overtly concerning for meningitis.  Remains on broad-spectrum IV antibiotics and IV acyclovir.  Infectious disease consulted.  Concern for possible manifestation of underlying hematologic malignancy.  I have requested consultation from oncology.  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  She sees oncology only once yearly.  Patient was evaluated by oncology.  Very unlikely that current presentation is a manifestation of underlying CLL.  No clear infectious signs.  All cultures remain negative.  LP is inconsistent with meningitis.  CT head chest abdomen pelvis significant surprisingly for pancreatitis.  Lipase is elevated.  Patient has been responding to treatment for pancreatitis including bowel rest and IV fluids.  Oral thrush is responding to treatment.    8/2: Patient remains altered and encephalopathic.  Etiology at this point has been unclear.  I requested consultation from neurology.  Possible vitamin deficiency.  Recommending high-dose IV thiamine.   Assessment & Plan:   Principal  Problem:   Subacute delirium Active Problems:   DDD (degenerative disc disease), cervical   Aphasia   Anemia of chronic disease   Acute focal neurological deficit   Encephalitis   Altered mental status   Encephalopathy acute   Fever, recurrent Hx of epidural and paraspinal abscess --daily fevers  --repeat covid neg.  No cough or dyspnea to suggest PNA.  Procal increased. --MRI brain, spine 8/5, no obvious infection --UA pos for 20,000 COLONIES/mL ENTEROBACTER AEROGENES.  Started on Cipro, per ID. Plan: --cont Cipro  --start doxy for possible tick-born infection --considered pancreatic necrosis or abscess, however, due to worsening AKI presumed due to contrast, want to avoid repeat contrast exposure.  Acute pancreatitis Unclear etiology No reported history of alcohol intake, no radiographic evidence of gallstones Possible drug-induced however unclear exactly which drug could have driven this Initial abdominal examination negative for tenderness Lipase trending down with IVF, but much slowly than usual. Triglycerides negative Plan: --considered pancreatic necrosis or abscess, however, due to worsening AKI presumed due to contrast, want to avoid repeat contrast exposure.  Acute toxic metabolic encephalopathy, resolved Unclear etiology --meningitis ruled out by LP.  CT head neg for new stroke.  Hematology ruled out CLL as the culprit.  No seizure on EEG. --mental status dramatically improved with treatment of pancreatitis and IV thiamine repletion. --MRI brain, no acute finding --cont thiamine  Pancytopenia History of CLL All 3 cell lines acutely depressed Oncology consulted Patient's CLL has been in remission since 2016 Recurrence of hematologic note malignancy felt unlikely --s/p 1u pRBC 8/5 for Hgb 7.1 Plan: --monitor Hgb and transfuse to keep Hgb >7  AKI --baseline ~0.7.  Urine  appeared very dark.   --Cr trending up despite MIVF.  BUN remained low.  Cr up to 2.06  this morning. --nephrology consulted --renal US showed medical renal disease Plan: --NS@100  for 5 hours, per nephrology  hypokalemia --monitor and replete PRN  Hypomag --replete with IV mag  Mouth pain and Oral thrush, resolved Patient with clinical indications of thrush Associated with difficulty swallowing Seems to be improving Patient states poor p.o. intake is driven by appetite rather than pain --s/p Diflucan for 3 doses  Insulin-dependent diabetes mellitus Hypoglycemic episodes Patient is on long-acting formulation 50 units daily at home in addition to metformin --A1c 6.5  --hypoglycemia due to q4h SSI Plan: --cont glargine 10u daily --SSI  Hyperlipidemia --cont statin  Hypothyroid --cont Synthroid  HTN --cont cardizem and hydralazine   DVT prophylaxis: TED hose Code Status: Full code Family Communication: husband updated at bedside today.   Disposition Plan: Status is: Inpatient  Remains inpatient appropriate because:Inpatient level of care appropriate due to severity of illness  Dispo: The patient is from: Home              Anticipated d/c is to: Home (pt refuses [name]).              Patient currently is not medically stable to d/c.  AKI not improving.   Difficult to place patient No   Level of care: Med-Surg  Consultants:  ID Oncology Neurology  Procedures:  Lumbar puncture 7/28  Antimicrobials:    Subjective: Continues to have daily fevers.  ID started pt on doxy for tick-born infection.  Cr continues to trend up.  Pt reported BM still loose.   Objective: Vitals:   12/02/20 0418 12/02/20 0802 12/02/20 1122 12/02/20 1637  BP: (!) 145/78 121/72 134/73 112/63  Pulse: 84 86 90 83  Resp: 18 18 16 18   Temp: 99.5 F (37.5 C) 100.2 F (37.9 C) (!) 100.6 F (38.1 C) 98.8 F (37.1 C)  TempSrc: Oral     SpO2: 99% 100% 99% 96%  Weight:      Height:        Intake/Output Summary (Last 24 hours) at 12/02/2020 1713 Last data filed at  12/02/2020 1300 Gross per 24 hour  Intake 500 ml  Output 1000 ml  Net -500 ml   Filed Weights   11/20/20 0507 12/01/20 0920 12/01/20 1730  Weight: 83 kg 94.9 kg 94.4 kg    Examination:  Constitutional: NAD, AAOx3 HEENT: conjunctivae and lids normal, EOMI CV: No cyanosis.   RESP: normal respiratory effort, on RA Extremities: No effusions, edema in BLE SKIN: warm, dry Neuro: II - XII grossly intact.   Psych: grouchy mood and affect.      Data Reviewed: I have personally reviewed following labs and imaging studies  CBC: Recent Labs  Lab 11/26/20 0451 11/27/20 0458 11/28/20 0455 11/29/20 0419 11/30/20 0503 12/01/20 0620 12/02/20 0601  WBC 7.2   < > 4.1 5.4 4.9 4.5 4.2  NEUTROABS 5.8  --   --   --   --   --  3.1  HGB 7.8*   < > 7.1* 7.6* 7.7* 7.5* 7.4*  HCT 24.4*   < > 22.4* 22.8* 24.3* 22.3* 22.6*  MCV 83.3   < > 83.9 82.6 81.8 83.2 82.8  PLT 75*   < > 87* 79* 73* 65* 63*   < > = values in this interval not displayed.   Basic Metabolic Panel: Recent Labs  Lab 11/26/20 0451 11/27/20 0458 11/28/20  9767 11/29/20 0419 11/30/20 0503 12/01/20 0620 12/02/20 0601  NA 135 136 136 137 136 136 137  K 3.7 3.5 3.4* 4.0 3.6 3.9 3.9  CL 107 106 108 110 111 109 111  CO2 24 24 25 23 23  20* 21*  GLUCOSE 68* 84 165* 91 161* 148* 105*  BUN 9 8 12 12 14 15 16   CREATININE 0.89 1.00 1.35* 1.38* 1.54* 1.86* 2.06*  CALCIUM 7.5* 7.8* 7.5* 7.2* 7.3* 7.5* 7.8*  MG 1.5* 1.5* 1.6* 2.4 2.2 1.8 2.0  PHOS 3.2 3.1 3.2  --   --   --   --    GFR: Estimated Creatinine Clearance: 39.3 mL/min (A) (by C-G formula based on SCr of 2.06 mg/dL (H)). Liver Function Tests: Recent Labs  Lab 11/28/20 0455 11/30/20 0503  AST 45* 58*  ALT 6 7  ALKPHOS 71 74  BILITOT 0.4 0.5  PROT 5.3* 5.2*  ALBUMIN 2.0* 1.9*   Recent Labs  Lab 11/28/20 0455 11/29/20 0419 11/30/20 0503 12/01/20 0620 12/02/20 0601  LIPASE 387* 209* 306* 270* 227*   No results for input(s): AMMONIA in the last 168  hours.  Coagulation Profile: No results for input(s): INR, PROTIME in the last 168 hours.  Cardiac Enzymes: No results for input(s): CKTOTAL, CKMB, CKMBINDEX, TROPONINI in the last 168 hours. BNP (last 3 results) No results for input(s): PROBNP in the last 8760 hours. HbA1C: No results for input(s): HGBA1C in the last 72 hours. CBG: Recent Labs  Lab 12/01/20 1610 12/01/20 2011 12/02/20 0800 12/02/20 1131 12/02/20 1640  GLUCAP 238* 142* 97 198* 128*   Lipid Profile: No results for input(s): CHOL, HDL, LDLCALC, TRIG, CHOLHDL, LDLDIRECT in the last 72 hours.  Thyroid Function Tests: No results for input(s): TSH, T4TOTAL, FREET4, T3FREE, THYROIDAB in the last 72 hours.   Anemia Panel: Recent Labs    11/30/20 0503  FERRITIN 690*    Sepsis Labs: Recent Labs  Lab 11/27/20 0458 11/28/20 0455 11/29/20 0419 12/02/20 0601  PROCALCITON 1.79 2.07 1.80 1.74    Recent Results (from the past 240 hour(s))  Resp Panel by RT-PCR (Flu A&B, Covid) Nasopharyngeal Swab     Status: None   Collection Time: 11/27/20  1:39 PM   Specimen: Nasopharyngeal Swab; Nasopharyngeal(NP) swabs in vial transport medium  Result Value Ref Range Status   SARS Coronavirus 2 by RT PCR NEGATIVE NEGATIVE Final    Comment: (NOTE) SARS-CoV-2 target nucleic acids are NOT DETECTED.  The SARS-CoV-2 RNA is generally detectable in upper respiratory specimens during the acute phase of infection. The lowest concentration of SARS-CoV-2 viral copies this assay can detect is 138 copies/mL. A negative result does not preclude SARS-Cov-2 infection and should not be used as the sole basis for treatment or other patient management decisions. A negative result may occur with  improper specimen collection/handling, submission of specimen other than nasopharyngeal swab, presence of viral mutation(s) within the areas targeted by this assay, and inadequate number of viral copies(<138 copies/mL). A negative result must  be combined with clinical observations, patient history, and epidemiological information. The expected result is Negative.  Fact Sheet for Patients:  EntrepreneurPulse.com.au  Fact Sheet for Healthcare Providers:  IncredibleEmployment.be  This test is no t yet approved or cleared by the [place] FDA and  has been authorized for detection and/or diagnosis of SARS-CoV-2 by FDA under an Emergency Use Authorization (EUA). This EUA will remain  in effect (meaning this test can be used) for the duration of the COVID-19  declaration under Section 564(b)(1) of the Act, 21 U.S.C.section 360bbb-3(b)(1), unless the authorization is terminated  or revoked sooner.       Influenza A by PCR NEGATIVE NEGATIVE Final   Influenza B by PCR NEGATIVE NEGATIVE Final    Comment: (NOTE) The Xpert Xpress SARS-CoV-2/FLU/RSV plus assay is intended as an aid in the diagnosis of influenza from Nasopharyngeal swab specimens and should not be used as a sole basis for treatment. Nasal washings and aspirates are unacceptable for Xpert Xpress SARS-CoV-2/FLU/RSV testing.  Fact Sheet for Patients: EntrepreneurPulse.com.au  Fact Sheet for Healthcare Providers: IncredibleEmployment.be  This test is not yet approved or cleared by the [place] FDA and has been authorized for detection and/or diagnosis of SARS-CoV-2 by FDA under an Emergency Use Authorization (EUA). This EUA will remain in effect (meaning this test can be used) for the duration of the COVID-19 declaration under Section 564(b)(1) of the Act, 21 U.S.C. section 360bbb-3(b)(1), unless the authorization is terminated or revoked.  Performed at [facility], [address] [place], [place] [address]   Urine Culture     Status: Abnormal   Collection Time: 11/27/20  2:15 PM   Specimen: Urine, Clean Catch  Result Value Ref Range Status   Specimen Description    Final    URINE, CLEAN CATCH Performed at [facility], [address], [place], [place] [address]    Special Requests   Final    NONE Performed at [facility], [address] [place], [place] [address]    Culture 20,000 COLONIES/mL ENTEROBACTER AEROGENES (A)  Final   Report Status 11/30/2020 FINAL  Final   Organism ID, Bacteria ENTEROBACTER AEROGENES (A)  Final      Susceptibility   Enterobacter aerogenes - MIC*    CEFAZOLIN >=64 RESISTANT Resistant     CEFEPIME 1 SENSITIVE Sensitive     CEFTRIAXONE >=64 RESISTANT Resistant     CIPROFLOXACIN <=0.25 SENSITIVE Sensitive     GENTAMICIN <=1 SENSITIVE Sensitive     IMIPENEM <=0.25 SENSITIVE Sensitive     NITROFURANTOIN 64 INTERMEDIATE Intermediate     TRIMETH/SULFA <=20 SENSITIVE Sensitive     PIP/TAZO >=128 RESISTANT Resistant     * 20,000 COLONIES/mL ENTEROBACTER AEROGENES  CULTURE, BLOOD (ROUTINE X 2) w Reflex to ID Panel     Status: None   Collection Time: 11/27/20  8:23 PM   Specimen: BLOOD  Result Value Ref Range Status   Specimen Description BLOOD LAC  Final   Special Requests   Final    BOTTLES DRAWN AEROBIC AND ANAEROBIC Blood Culture results may not be optimal due to an inadequate volume of blood received in culture bottles   Culture   Final    NO GROWTH 5 DAYS Performed at [facility], [address] [place], [place] [address]    Report Status 12/02/2020 FINAL  Final  CULTURE, BLOOD (ROUTINE X 2) w Reflex to ID Panel     Status: None   Collection Time: 11/27/20  9:32 PM   Specimen: BLOOD LEFT HAND  Result Value Ref Range Status   Specimen Description BLOOD LEFT HAND  Final   Special Requests   Final    BOTTLES DRAWN AEROBIC AND ANAEROBIC Blood Culture adequate volume   Culture   Final    NO GROWTH 5 DAYS Performed at [facility], [address] [place], [place] [address]    Report Status 12/02/2020 FINAL  Final  Radiology Studies: US RENAL  Result Date:  2020/12/22 CLINICAL DATA:  Acute kidney injury, history CLL, diabetes mellitus, hypertension EXAM: RENAL / URINARY TRACT ULTRASOUND COMPLETE COMPARISON:  CT abdomen and pelvis 11/21/2020 FINDINGS: Right Kidney: Renal measurements: 11.4 x 5.8 x 6.0 cm = volume: 206 mL. Normal cortical thickness. Increased cortical echogenicity. No mass, hydronephrosis, or shadowing calcification. Left Kidney: Renal measurements: 13.1 x 7.3 x 6.1 cm = volume: 3 L4 mL. Normal cortical thickness. Increased cortical echogenicity. Tiny cyst at mid kidney 9 x 13 x 13 mm, simple features. No additional mass, hydronephrosis, or shadowing calcification. Bladder: Partially distended. Cystic intraluminal focus at the posterior wall RIGHT of midline question RIGHT ureterocele approximately 16 x 6 x 9 mm in size. Other: N/A IMPRESSION: Medical renal disease changes of both kidneys. Intraluminal cystic focus posterior RIGHT bladder, favor representing a small RIGHT ureterocele. Electronically Signed   By: Lavonia Dana M.D.   On: 12/22/2020 16:26        Scheduled Meds:  acetaminophen  1,000 mg Oral Once   Chlorhexidine Gluconate Cloth  6 each Topical Daily   ciprofloxacin  250 mg Oral BID   diltiazem  120 mg Oral Daily   feeding supplement  1 Container Oral TID BM   hydrALAZINE  50 mg Oral Q8H   insulin aspart  0-15 Units Subcutaneous TID WC   insulin glargine-yfgn  10 Units Subcutaneous Daily   levothyroxine  100 mcg Oral QAC breakfast   magnesium oxide  800 mg Oral BID   mouth rinse  15 mL Mouth Rinse q12n4p   mirtazapine  15 mg Oral QHS   multivitamin with minerals  1 tablet Oral Daily   pantoprazole  40 mg Oral Daily   rosuvastatin  10 mg Oral QHS   sodium chloride flush  10-40 mL Intracatheter Q12H   thiamine  100 mg Oral Daily   Continuous Infusions:  sodium chloride 100 mL/hr at 12/02/20 1504   doxycycline (VIBRAMYCIN) IV 100 mg (12/02/20 1126)      LOS: 12 days     Enzo Bi, MD [facility] Pager  [phone]  If 7PM-7AM, please contact night-coverage 12/02/2020, 5:13 PM

## 2020-12-02 NOTE — Progress Notes (Signed)
[facility]  ROUNDING NOTE   Subjective:   Tonya Myers is a 55 y.o. female with medical conditions including pneumococcal meningitis bacteremia, hypothyroidism, Type 2 diabetes, and lymphoma in remission. She presents to the ED with confusion and is admitted for Altered mental status [R41.82] Encephalopathy acute [G93.40] Altered mental status, unspecified altered mental status type [R41.82] Sepsis with encephalopathy without septic shock, due to unspecified organism ([place]) [A41.9, R65.20, G93.40]  Patient is seen resting in bed Alert and oriented Husband at bedside Tolerating meals and maintains a good appetite Denies nausea and vomiting Denies shortness of breath States she is more concerned about anemia and feeling so cold   Objective:  Vital signs in last 24 hours:  Temp:  [99.5 F (37.5 C)-100.7 F (38.2 C)] 100.2 F (37.9 C) (08/09 0802) Pulse Rate:  [84-90] 86 (08/09 0802) Resp:  [16-20] 18 (08/09 0802) BP: (121-145)/(59-78) 121/72 (08/09 0802) SpO2:  [95 %-100 %] 100 % (08/09 0802) Weight:  [94.4 kg] 94.4 kg (08/08 1730)  Weight change:  Filed Weights   11/20/20 0507 12/01/20 0920 12/01/20 1730  Weight: 83 kg 94.9 kg 94.4 kg    Intake/Output: I/O last 3 completed shifts: In: 740 [P.O.:480; I.V.:10; IV Piggyback:250] Out: 500 [Urine:500]   Intake/Output this shift:  No intake/output data recorded.  Physical Exam: General: NAD, sitting up in bed  Head: Normocephalic, atraumatic. Moist oral mucosal membranes  Eyes: Anicteric  Lungs:  Clear to auscultation, normal effort  Heart: Regular rate and rhythm  Abdomen:  Soft, nontender  Extremities:  no peripheral edema.  Neurologic: Alert, moving all four extremities  Skin: No lesions       Basic Metabolic Panel: Recent Labs  Lab 11/26/20 0451 11/27/20 0458 11/28/20 0455 11/29/20 0419 11/30/20 0503 12/01/20 0620 12/02/20 0601  NA 135 136 136 137 136 136 137  K 3.7 3.5 3.4* 4.0 3.6  3.9 3.9  CL 107 106 108 110 111 109 111  CO2 24 24 25 23 23  20* 21*  GLUCOSE 68* 84 165* 91 161* 148* 105*  BUN 9 8 12 12 14 15 16   CREATININE 0.89 1.00 1.35* 1.38* 1.54* 1.86* 2.06*  CALCIUM 7.5* 7.8* 7.5* 7.2* 7.3* 7.5* 7.8*  MG 1.5* 1.5* 1.6* 2.4 2.2 1.8 2.0  PHOS 3.2 3.1 3.2  --   --   --   --     Liver Function Tests: Recent Labs  Lab 11/28/20 0455 11/30/20 0503  AST 45* 58*  ALT 6 7  ALKPHOS 71 74  BILITOT 0.4 0.5  PROT 5.3* 5.2*  ALBUMIN 2.0* 1.9*   Recent Labs  Lab 11/28/20 0455 11/29/20 0419 11/30/20 0503 12/01/20 0620 12/02/20 0601  LIPASE 387* 209* 306* 270* 227*   No results for input(s): AMMONIA in the last 168 hours.  CBC: Recent Labs  Lab 11/26/20 0451 11/27/20 0458 11/28/20 0455 11/29/20 0419 11/30/20 0503 12/01/20 0620 12/02/20 0601  WBC 7.2   < > 4.1 5.4 4.9 4.5 4.2  NEUTROABS 5.8  --   --   --   --   --  3.1  HGB 7.8*   < > 7.1* 7.6* 7.7* 7.5* 7.4*  HCT 24.4*   < > 22.4* 22.8* 24.3* 22.3* 22.6*  MCV 83.3   < > 83.9 82.6 81.8 83.2 82.8  PLT 75*   < > 87* 79* 73* 65* 63*   < > = values in this interval not displayed.    Cardiac Enzymes: No results for input(s): CKTOTAL, CKMB,  CKMBINDEX, TROPONINI in the last 168 hours.  BNP: Invalid input(s): POCBNP  CBG: Recent Labs  Lab 12/01/20 0712 12/01/20 1208 12/01/20 1610 12/01/20 2011 12/02/20 0800  GLUCAP 137* 164* 238* 142* 42    Microbiology: Results for orders placed or performed during the hospital encounter of 11/19/20  Blood culture (routine x 2)     Status: None   Collection Time: 11/19/20  4:47 PM   Specimen: BLOOD  Result Value Ref Range Status   Specimen Description BLOOD RIGHT ANTECUBITAL  Final   Special Requests   Final    BOTTLES DRAWN AEROBIC AND ANAEROBIC Blood Culture adequate volume   Culture   Final    NO GROWTH 5 DAYS Performed at [facility], [address] [place], [place] [address]    Report Status 11/24/2020 FINAL  Final  Resp Panel by  RT-PCR (Flu A&B, Covid) Nasopharyngeal Swab     Status: None   Collection Time: 11/19/20  4:48 PM   Specimen: Nasopharyngeal Swab; Nasopharyngeal(NP) swabs in vial transport medium  Result Value Ref Range Status   SARS Coronavirus 2 by RT PCR NEGATIVE NEGATIVE Final    Comment: (NOTE) SARS-CoV-2 target nucleic acids are NOT DETECTED.  The SARS-CoV-2 RNA is generally detectable in upper respiratory specimens during the acute phase of infection. The lowest concentration of SARS-CoV-2 viral copies this assay can detect is 138 copies/mL. A negative result does not preclude SARS-Cov-2 infection and should not be used as the sole basis for treatment or other patient management decisions. A negative result may occur with  improper specimen collection/handling, submission of specimen other than nasopharyngeal swab, presence of viral mutation(s) within the areas targeted by this assay, and inadequate number of viral copies(<138 copies/mL). A negative result must be combined with clinical observations, patient history, and epidemiological information. The expected result is Negative.  Fact Sheet for Patients:  EntrepreneurPulse.com.au  Fact Sheet for Healthcare Providers:  IncredibleEmployment.be  This test is no t yet approved or cleared by the [place] FDA and  has been authorized for detection and/or diagnosis of SARS-CoV-2 by FDA under an Emergency Use Authorization (EUA). This EUA will remain  in effect (meaning this test can be used) for the duration of the COVID-19 declaration under Section 564(b)(1) of the Act, 21 U.S.C.section 360bbb-3(b)(1), unless the authorization is terminated  or revoked sooner.       Influenza A by PCR NEGATIVE NEGATIVE Final   Influenza B by PCR NEGATIVE NEGATIVE Final    Comment: (NOTE) The Xpert Xpress SARS-CoV-2/FLU/RSV plus assay is intended as an aid in the diagnosis of influenza from Nasopharyngeal swab  specimens and should not be used as a sole basis for treatment. Nasal washings and aspirates are unacceptable for Xpert Xpress SARS-CoV-2/FLU/RSV testing.  Fact Sheet for Patients: EntrepreneurPulse.com.au  Fact Sheet for Healthcare Providers: IncredibleEmployment.be  This test is not yet approved or cleared by the [place] FDA and has been authorized for detection and/or diagnosis of SARS-CoV-2 by FDA under an Emergency Use Authorization (EUA). This EUA will remain in effect (meaning this test can be used) for the duration of the COVID-19 declaration under Section 564(b)(1) of the Act, 21 U.S.C. section 360bbb-3(b)(1), unless the authorization is terminated or revoked.  Performed at [facility], [address] [place], [place] [address]   Blood culture (routine x 2)     Status: None   Collection Time: 11/19/20  6:46 PM   Specimen: BLOOD  Result Value Ref Range Status  Specimen Description BLOOD RIGHT ANTECUBITAL  Final   Special Requests   Final    BOTTLES DRAWN AEROBIC AND ANAEROBIC Blood Culture adequate volume   Culture   Final    NO GROWTH 5 DAYS Performed at [facility], [address], [place], [place] [address]    Report Status 11/24/2020 FINAL  Final  Urine Culture     Status: Abnormal   Collection Time: 11/19/20  9:33 PM   Specimen: Urine, Clean Catch  Result Value Ref Range Status   Specimen Description   Final    URINE, CLEAN CATCH Performed at [name], [address] [place], [place] [address]    Special Requests   Final    NONE Performed at [facility], [address], [place], [place] [address]    Culture (A)  Final    <10,000 COLONIES/mL INSIGNIFICANT GROWTH Performed at [facility], [name] [address], [place], [place] [address]    Report Status 11/21/2020 FINAL  Final  Culture, fungus without smear     Status: None (Preliminary result)   Collection Time:  11/20/20 11:09 AM   Specimen: CSF; Cerebrospinal Fluid  Result Value Ref Range Status   Specimen Description   Final    CSF Performed at [facility], [address] [place], [place] [address]    Special Requests   Final    NONE Performed at [facility], [address] [place], [place] [address]    Culture   Final    NO FUNGUS ISOLATED AFTER 11 DAYS Performed at [facility], [place] [address] [place], [place] [address]    Report Status PENDING  Incomplete  CSF culture w Gram Stain     Status: None   Collection Time: 11/20/20 11:09 AM   Specimen: PATH Cytology CSF; Cerebrospinal Fluid  Result Value Ref Range Status   Specimen Description   Final    CSF Performed at [facility], [address] [place], [place] [address]    Special Requests   Final    NONE Performed at [facility], [address] [place], [place] [address]    Gram Stain   Final    NO ORGANISMS SEEN WBC SEEN RED BLOOD CELLS PRESENT Performed at [facility], [address], [place], [place] [address]    Culture   Final    NO GROWTH 3 DAYS Performed at [facility], [place] [address] [place], [place] [address]    Report Status 11/23/2020 FINAL  Final  Fungus Culture With Stain     Status: None (Preliminary result)   Collection Time: 11/20/20 11:09 AM   Specimen: PATH Cytology CSF; Cerebrospinal Fluid  Result Value Ref Range Status   Fungus Stain Final report  Final    Comment: (NOTE) Performed At: [facility] [place], [place] [id number] Rush Farmer MD GY:[id number]    Fungus (Mycology) Culture PENDING  Incomplete   Fungal Source CSF  Final    Comment: Performed at [facility], [address] [place], [place] [address]  Fungus Culture Result     Status: None   Collection Time: 11/20/20 11:09 AM  Result Value Ref Range Status   Result 1 Comment  Final    Comment: (NOTE) KOH/Calcofluor preparation:   no fungus observed. Performed At: [facility] [place], [place] [id number] Rush Farmer MD [id number]   Resp Panel by RT-PCR (Flu A&B, Covid) Nasopharyngeal  Swab     Status: None   Collection Time: 11/27/20  1:39 PM   Specimen: Nasopharyngeal Swab; Nasopharyngeal(NP) swabs in vial transport medium  Result Value Ref Range Status   SARS Coronavirus 2 by RT PCR NEGATIVE NEGATIVE Final    Comment: (NOTE) SARS-CoV-2 target nucleic acids are NOT DETECTED.  The SARS-CoV-2 RNA is generally detectable in upper respiratory specimens during the acute phase of infection. The lowest concentration of SARS-CoV-2 viral copies this assay can detect is 138 copies/mL. A negative result does not preclude SARS-Cov-2 infection and should not be used as the sole basis for treatment or other patient management decisions. A negative result may occur with  improper specimen collection/handling, submission of specimen other than nasopharyngeal swab, presence of viral mutation(s) within the areas targeted by this assay, and inadequate number of viral copies(<138 copies/mL). A negative result must be combined with clinical observations, patient history, and epidemiological information. The expected result is Negative.  Fact Sheet for Patients:  EntrepreneurPulse.com.au  Fact Sheet for Healthcare Providers:  IncredibleEmployment.be  This test is no t yet approved or cleared by the [place] FDA and  has been authorized for detection and/or diagnosis of SARS-CoV-2 by FDA under an Emergency Use Authorization (EUA). This EUA will remain  in effect (meaning this test can be used) for the duration of the COVID-19 declaration under Section 564(b)(1) of the Act, 21 U.S.C.section 360bbb-3(b)(1), unless the authorization is terminated  or revoked sooner.       Influenza A by PCR NEGATIVE NEGATIVE Final   Influenza B by PCR NEGATIVE NEGATIVE Final     Comment: (NOTE) The Xpert Xpress SARS-CoV-2/FLU/RSV plus assay is intended as an aid in the diagnosis of influenza from Nasopharyngeal swab specimens and should not be used as a sole basis for treatment. Nasal washings and aspirates are unacceptable for Xpert Xpress SARS-CoV-2/FLU/RSV testing.  Fact Sheet for Patients: EntrepreneurPulse.com.au  Fact Sheet for Healthcare Providers: IncredibleEmployment.be  This test is not yet approved or cleared by the [place] FDA and has been authorized for detection and/or diagnosis of SARS-CoV-2 by FDA under an Emergency Use Authorization (EUA). This EUA will remain in effect (meaning this test can be used) for the duration of the COVID-19 declaration under Section 564(b)(1) of the Act, 21 U.S.C. section 360bbb-3(b)(1), unless the authorization is terminated or revoked.  Performed at [facility], [address], [place], [place] [address]   Urine Culture     Status: Abnormal   Collection Time: 11/27/20  2:15 PM   Specimen: Urine, Clean Catch  Result Value Ref Range Status   Specimen Description   Final    URINE, CLEAN CATCH Performed at [facility], [address], [place], [place] [address]    Special Requests   Final    NONE Performed at [facility], [place], [place] [address]    Culture 20,000 COLONIES/mL ENTEROBACTER AEROGENES (A)  Final   Report Status 11/30/2020 FINAL  Final   Organism ID, Bacteria ENTEROBACTER AEROGENES (A)  Final      Susceptibility   Enterobacter aerogenes - MIC*    CEFAZOLIN >=64 RESISTANT Resistant     CEFEPIME 1 SENSITIVE Sensitive     CEFTRIAXONE >=64 RESISTANT Resistant     CIPROFLOXACIN <=0.25 SENSITIVE Sensitive     GENTAMICIN <=1 SENSITIVE Sensitive     IMIPENEM <=0.25 SENSITIVE Sensitive     NITROFURANTOIN 64 INTERMEDIATE Intermediate     TRIMETH/SULFA <=20 SENSITIVE Sensitive  PIP/TAZO >=128 RESISTANT  Resistant     * 20,000 COLONIES/mL ENTEROBACTER AEROGENES  CULTURE, BLOOD (ROUTINE X 2) w Reflex to ID Panel     Status: None   Collection Time: 11/27/20  8:23 PM   Specimen: BLOOD  Result Value Ref Range Status   Specimen Description BLOOD LAC  Final   Special Requests   Final    BOTTLES DRAWN AEROBIC AND ANAEROBIC Blood Culture results may not be optimal due to an inadequate volume of blood received in culture bottles   Culture   Final    NO GROWTH 5 DAYS Performed at [facility], [address] [place], [place] [address]    Report Status 12/02/2020 FINAL  Final  CULTURE, BLOOD (ROUTINE X 2) w Reflex to ID Panel     Status: None   Collection Time: 11/27/20  9:32 PM   Specimen: BLOOD LEFT HAND  Result Value Ref Range Status   Specimen Description BLOOD LEFT HAND  Final   Special Requests   Final    BOTTLES DRAWN AEROBIC AND ANAEROBIC Blood Culture adequate volume   Culture   Final    NO GROWTH 5 DAYS Performed at [facility], [address] [place], [place] [address]    Report Status 12/02/2020 FINAL  Final    Coagulation Studies: No results for input(s): LABPROT, INR in the last 72 hours.  Urinalysis: Recent Labs    12/02/20 0600  COLORURINE YELLOW*  LABSPEC 1.009  PHURINE 7.0  GLUCOSEU NEGATIVE  HGBUR LARGE*  BILIRUBINUR NEGATIVE  KETONESUR NEGATIVE  PROTEINUR 100*  NITRITE NEGATIVE  LEUKOCYTESUR NEGATIVE      Imaging: US RENAL  Result Date: 12/01/2020 CLINICAL DATA:  Acute kidney injury, history CLL, diabetes mellitus, hypertension EXAM: RENAL / URINARY TRACT ULTRASOUND COMPLETE COMPARISON:  CT abdomen and pelvis 11/21/2020 FINDINGS: Right Kidney: Renal measurements: 11.4 x 5.8 x 6.0 cm = volume: 206 mL. Normal cortical thickness. Increased cortical echogenicity. No mass, hydronephrosis, or shadowing calcification. Left Kidney: Renal measurements: 13.1 x 7.3 x 6.1 cm = volume: 3 L4 mL. Normal cortical thickness. Increased cortical  echogenicity. Tiny cyst at mid kidney 9 x 13 x 13 mm, simple features. No additional mass, hydronephrosis, or shadowing calcification. Bladder: Partially distended. Cystic intraluminal focus at the posterior wall RIGHT of midline question RIGHT ureterocele approximately 16 x 6 x 9 mm in size. Other: N/A IMPRESSION: Medical renal disease changes of both kidneys. Intraluminal cystic focus posterior RIGHT bladder, favor representing a small RIGHT ureterocele. Electronically Signed   By: Lavonia Dana M.D.   On: 12/01/2020 16:26     Medications:    doxycycline (VIBRAMYCIN) IV 100 mg (12/01/20 2327)    acetaminophen  1,000 mg Oral Once   Chlorhexidine Gluconate Cloth  6 each Topical Daily   ciprofloxacin  250 mg Oral BID   diltiazem  120 mg Oral Daily   feeding supplement  1 Container Oral TID BM   hydrALAZINE  50 mg Oral Q8H   insulin aspart  0-15 Units Subcutaneous TID WC   insulin glargine-yfgn  10 Units Subcutaneous Daily   levothyroxine  100 mcg Oral QAC breakfast   magnesium oxide  800 mg Oral BID   mouth rinse  15 mL Mouth Rinse q12n4p   mirtazapine  15 mg Oral QHS   multivitamin with minerals  1 tablet Oral Daily   pantoprazole  40 mg Oral Daily   rosuvastatin  10 mg Oral QHS   sodium chloride flush  10-40 mL Intracatheter Q12H   thiamine  100 mg Oral Daily   acetaminophen **OR** acetaminophen, albuterol, haloperidol lactate, ondansetron **OR** ondansetron (ZOFRAN) IV, polyethylene glycol, sodium chloride flush  Assessment/ Plan:  [name] is a 55 y.o.  female with medical conditions including pneumococcal meningitis bacteremia, hypothyroidism, Type 2 diabetes, and lymphoma in remission. She presents to the ED with confusion and is admitted for Altered mental status [R41.82] Encephalopathy acute [G93.40] Altered mental status, unspecified altered mental status type [R41.82] Sepsis with encephalopathy without septic shock, due to unspecified organism ([place]) [A41.9, R65.20,  G93.40]   Acute Kidney Injury with baseline creatinine 0.49 on 11/22/20.  Acute kidney injury secondary to contrast induced ATN. IV contrast exposure on November 21, 2020.  UA shows protienuria, >50 RBCs, 21-50 WBC, microbiology 20k colonies- Enterobacter. ACE-I/ARB Renal ultrasound negative for mass and obstruction. Questionable small urterocele No acute need for dialysis at this time Creatinine slightly worse today with UOP 543ml Will order gentle IVF NS @100  ml/hr for 561ml and re-evalute renal function Strict intake and output Continue to avoid hypotension, ACE/ARBs and any nephrotoxic agents   Lab Results  Component Value Date   CREATININE 2.06 (H) 12/02/2020   CREATININE 1.86 (H) 12/01/2020   CREATININE 1.54 (H) 11/30/2020    Intake/Output Summary (Last 24 hours) at 12/02/2020 1119 Last data filed at 12/02/2020 1007 Gross per 24 hour  Intake 740 ml  Output 500 ml  Net 240 ml       LOS: 12   8/9/202211:19 AM

## 2020-12-02 NOTE — Progress Notes (Signed)
Date of Admission:  11/19/2020     ID: Tonya Myers is a 55 y.o. female  Principal Problem:   Subacute delirium Active Problems:   DDD (degenerative disc disease), cervical   Aphasia   Anemia of chronic disease   Acute focal neurological deficit   Encephalitis   Altered mental status   Encephalopathy acute    Subjective: Patient doing better. Fever curve coming down Able to eat well Wants to go home Still has dark urine  Medications:   acetaminophen  1,000 mg Oral Once   Chlorhexidine Gluconate Cloth  6 each Topical Daily   ciprofloxacin  250 mg Oral BID   diltiazem  120 mg Oral Daily   feeding supplement  1 Container Oral TID BM   hydrALAZINE  50 mg Oral Q8H   insulin aspart  0-15 Units Subcutaneous TID WC   insulin glargine-yfgn  10 Units Subcutaneous Daily   levothyroxine  100 mcg Oral QAC breakfast   magnesium oxide  800 mg Oral BID   mouth rinse  15 mL Mouth Rinse q12n4p   mirtazapine  15 mg Oral QHS   multivitamin with minerals  1 tablet Oral Daily   pantoprazole  40 mg Oral Daily   rosuvastatin  10 mg Oral QHS   sodium chloride flush  10-40 mL Intracatheter Q12H   thiamine  100 mg Oral Daily    Objective: Vital signs in last 24 hours: Temp:  [98.8 F (37.1 C)-100.6 F (38.1 C)] 100.5 F (38.1 C) (08/09 2024) Pulse Rate:  [82-90] 82 (08/09 2024) Resp:  [16-18] 16 (08/09 2024) BP: (112-145)/(63-78) 142/73 (08/09 2024) SpO2:  [95 %-100 %] 99 % (08/09 2024)  PHYSICAL EXAM:  General: Alert, cooperative, no distress, appears stated age.  Head: Normocephalic, without obvious abnormality, atraumatic. Eyes: Conjunctivae clear, anicteric sclerae. Pupils are equal ENT Nares normal. No drainage or sinus tenderness. Lips, mucosa, and tongue normal. No Thrush Neck: Supple, symmetrical, no adenopathy, thyroid: non tender no carotid bruit and no JVD. Back: No CVA tenderness. Lungs: Clear to auscultation bilaterally. No Wheezing or Rhonchi. No rales. Heart:  Regular rate and rhythm, no murmur, rub or gallop. Abdomen: Soft, non-tender,not distended. Bowel sounds normal. No masses Extremities: Ankles and feet skin: No rashes or lesions. Or bruising Lymph: Cervical, supraclavicular normal. Neurologic: Weakness extremities  Lab Results Recent Labs    12/01/20 0620 12/02/20 0601  WBC 4.5 4.2  HGB 7.5* 7.4*  HCT 22.3* 22.6*  NA 136 137  K 3.9 3.9  CL 109 111  CO2 20* 21*  BUN 15 16  CREATININE 1.86* 2.06*   Liver Panel Recent Labs    11/30/20 0503  PROT 5.2*  ALBUMIN 1.9*  AST 58*  ALT 7  ALKPHOS 74  BILITOT 0.5  BILIDIR 0.1  IBILI 0.4   Sedimentation Rate No results for input(s): ESRSEDRATE in the last 72 hours. C-Reactive Protein No results for input(s): CRP in the last 72 hours.  Microbiology:  Studies/Results: US RENAL  Result Date: 12/01/2020 CLINICAL DATA:  Acute kidney injury, history CLL, diabetes mellitus, hypertension EXAM: RENAL / URINARY TRACT ULTRASOUND COMPLETE COMPARISON:  CT abdomen and pelvis 11/21/2020 FINDINGS: Right Kidney: Renal measurements: 11.4 x 5.8 x 6.0 cm = volume: 206 mL. Normal cortical thickness. Increased cortical echogenicity. No mass, hydronephrosis, or shadowing calcification. Left Kidney: Renal measurements: 13.1 x 7.3 x 6.1 cm = volume: 3 L4 mL. Normal cortical thickness. Increased cortical echogenicity. Tiny cyst at mid kidney 9 x 13 x 13  mm, simple features. No additional mass, hydronephrosis, or shadowing calcification. Bladder: Partially distended. Cystic intraluminal focus at the posterior wall RIGHT of midline question RIGHT ureterocele approximately 16 x 6 x 9 mm in size. Other: N/A IMPRESSION: Medical renal disease changes of both kidneys. Intraluminal cystic focus posterior RIGHT bladder, favor representing a small RIGHT ureterocele. Electronically Signed   By: Lavonia Dana M.D.   On: 12/01/2020 16:26     Assessment/Plan: Encephalopathy on admission has resolved.  Meningitis and  encephalitis were ruled out.  She received high-dose thiamine and that seemed to have made some difference.  Acute pancreatitis Etiology unknown is much improved  Intermittent fever.  Negative imaging of the spine as well as the brain.  Blood cultures were negative.  Urine culture had Enterobacter aerogenes.  Ultrasound of the kidneys did not show any hydronephrosis.  Started on ciprofloxacin.  Will give for 3 days.  And treat like simple cystitis As fever persisted and with a history of tick attachment in June 2022 patient is on doxycycline.  Thrombocytopenia anemia.  In February 2022 she had disseminated strep pneumo infection involving the vertebra of thoracic and lumbar spine.  She had disc multilevel thoracic laminectomies and also drainage of the paraspinal abscess in the lumbar area.  She completed 6 weeks of IV antibiotics.  CLL in remission.    Discussed the management with the patient.

## 2020-12-03 ENCOUNTER — Encounter: Payer: Self-pay | Admitting: Internal Medicine

## 2020-12-03 DIAGNOSIS — R509 Fever, unspecified: Secondary | ICD-10-CM | POA: Diagnosis not present

## 2020-12-03 DIAGNOSIS — R410 Disorientation, unspecified: Secondary | ICD-10-CM | POA: Diagnosis not present

## 2020-12-03 LAB — BASIC METABOLIC PANEL
Anion gap: 5 (ref 5–15)
BUN: 19 mg/dL (ref 6–20)
CO2: 22 mmol/L (ref 22–32)
Calcium: 7.6 mg/dL — ABNORMAL LOW (ref 8.9–10.3)
Chloride: 111 mmol/L (ref 98–111)
Creatinine, Ser: 2.41 mg/dL — ABNORMAL HIGH (ref 0.44–1.00)
GFR, Estimated: 23 mL/min — ABNORMAL LOW (ref >60)
Glucose, Bld: 88 mg/dL (ref 70–99)
Potassium: 3.8 mmol/L (ref 3.5–5.1)
Sodium: 138 mmol/L (ref 135–145)

## 2020-12-03 LAB — CBC WITH DIFFERENTIAL/PLATELET
Abs Immature Granulocytes: 0.02 10*3/uL (ref 0.00–0.07)
Basophils Absolute: 0 10*3/uL (ref 0.0–0.1)
Basophils Relative: 1 %
Eosinophils Absolute: 0 10*3/uL (ref 0.0–0.5)
Eosinophils Relative: 1 %
HCT: 19.7 % — ABNORMAL LOW (ref 36.0–46.0)
Hemoglobin: 6.5 g/dL — ABNORMAL LOW (ref 12.0–15.0)
Immature Granulocytes: 1 %
Lymphocytes Relative: 15 %
Lymphs Abs: 0.5 10*3/uL — ABNORMAL LOW (ref 0.7–4.0)
MCH: 27.5 pg (ref 26.0–34.0)
MCHC: 33 g/dL (ref 30.0–36.0)
MCV: 83.5 fL (ref 80.0–100.0)
Monocytes Absolute: 0.1 10*3/uL (ref 0.1–1.0)
Monocytes Relative: 4 %
Neutro Abs: 2.6 10*3/uL (ref 1.7–7.7)
Neutrophils Relative %: 78 %
Platelets: 52 10*3/uL — ABNORMAL LOW (ref 150–400)
RBC: 2.36 MIL/uL — ABNORMAL LOW (ref 3.87–5.11)
RDW: 17.2 % — ABNORMAL HIGH (ref 11.5–15.5)
Smear Review: NORMAL
WBC: 3.4 10*3/uL — ABNORMAL LOW (ref 4.0–10.5)
nRBC: 0 % (ref 0.0–0.2)

## 2020-12-03 LAB — CK: Total CK: 19 U/L — ABNORMAL LOW (ref 38–234)

## 2020-12-03 LAB — GLUCOSE, CAPILLARY
Glucose-Capillary: 84 mg/dL (ref 70–99)
Glucose-Capillary: 171 mg/dL — ABNORMAL HIGH (ref 70–99)
Glucose-Capillary: 114 mg/dL — ABNORMAL HIGH (ref 70–99)
Glucose-Capillary: 86 mg/dL (ref 70–99)

## 2020-12-03 LAB — URINALYSIS, COMPLETE (UACMP) WITH MICROSCOPIC
Bilirubin Urine: NEGATIVE
Glucose, UA: NEGATIVE mg/dL
Ketones, ur: NEGATIVE mg/dL
Leukocytes,Ua: NEGATIVE
Nitrite: NEGATIVE
Protein, ur: 30 mg/dL — AB
RBC / HPF: >50 RBC/hpf — ABNORMAL HIGH (ref 0–5)
Specific Gravity, Urine: 1.013 (ref 1.005–1.030)
Squamous Epithelial / HPF: NONE SEEN (ref 0–5)
pH: 6 (ref 5.0–8.0)

## 2020-12-03 LAB — PROTEIN / CREATININE RATIO, URINE
Creatinine, Urine: 100 mg/dL
Protein Creatinine Ratio: 1.98 mg/mg{Cre} — ABNORMAL HIGH (ref 0.00–0.15)
Total Protein, Urine: 198 mg/dL

## 2020-12-03 LAB — HEMOGLOBIN AND HEMATOCRIT, BLOOD
HCT: 25.6 % — ABNORMAL LOW (ref 36.0–46.0)
Hemoglobin: 8.6 g/dL — ABNORMAL LOW (ref 12.0–15.0)

## 2020-12-03 LAB — HEPATIC FUNCTION PANEL
ALT: 12 U/L (ref 0–44)
AST: 62 U/L — ABNORMAL HIGH (ref 15–41)
Albumin: 1.9 g/dL — ABNORMAL LOW (ref 3.5–5.0)
Alkaline Phosphatase: 75 U/L (ref 38–126)
Bilirubin, Direct: <0.1 mg/dL (ref 0.0–0.2)
Total Bilirubin: 0.6 mg/dL (ref 0.3–1.2)
Total Protein: 5 g/dL — ABNORMAL LOW (ref 6.5–8.1)

## 2020-12-03 LAB — MAGNESIUM: Magnesium: 1.8 mg/dL (ref 1.7–2.4)

## 2020-12-03 LAB — LIPASE, BLOOD: Lipase: 190 U/L — ABNORMAL HIGH (ref 11–51)

## 2020-12-03 LAB — PROCALCITONIN: Procalcitonin: 1.71 ng/mL

## 2020-12-03 MED ORDER — SODIUM CHLORIDE 0.9% IV SOLUTION: Freq: Once | INTRAVENOUS | Status: AC

## 2020-12-03 NOTE — H&P (Signed)
Chief Complaint: Patient was seen in consultation today for pancytopenia at the request of Dr. Grayland Ormond  Referring Physician(s): Dr. Grayland Ormond  Supervising Physician: Arne Cleveland  Patient Status: Tonya Myers - In-pt  History of Present Illness: Tonya Myers is a 55 y.o. 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 She is currently admitted with diagnosis of AMS that has been improving, AKI possibly secondary to contrast induced ATN 11/21/20 and now concern for progressive ATN with fever. 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 Patient's lab work also reveals pancytopenia and Dr. Grayland Ormond, the patient's oncologist has requested an image guided bone marrow biopsy. Today, the patient denies any chest pain, shortness of breath or palpitations. The patient has previously tolerated sedation without complications.    Past Medical History:  Diagnosis Date   Acute hemorrhoid 03/11/2015   Anxiety    Arthritis    Asthma    Cancer ([place])    Chronic back pain    CLL (chronic lymphocytic leukemia) ([place])    Depression    Diabetes mellitus without complication ([place])    Genital herpes    type 2   Hypertension    Hypothyroidism    Vertigo     Past Surgical History:  Procedure Laterality Date   ABDOMINAL HYSTERECTOMY     BILATERAL SALPINGOOPHORECTOMY  2009   BREAST BIOPSY Right 05/17/2016   FIBROADENOMATOUS CHANGE AND SCLEROSING ADENOSIS WITH   COLONOSCOPY WITH PROPOFOL N/A 06/16/2015   Procedure: COLONOSCOPY WITH PROPOFOL;  Surgeon: [name], MD;  Location: [facility];  Service: Endoscopy;  Laterality: N/A;   LAPAROSCOPIC SUPRACERVICAL HYSTERECTOMY  2009   due to [place] Bilateral 06/17/2020   Procedure: THORACIC LAMINECTOMY FOR EPIDURAL ABSCESS;  Surgeon: Deetta Perla, MD;  Location: [facility];   Service: Neurosurgery;  Laterality: Bilateral;    Allergies: Amoxapine and Penicillins  Medications:  Current Facility-Administered Medications:    acetaminophen (TYLENOL) tablet 650 mg, 650 mg, Oral, Q6H PRN, 650 mg at 12/03/20 0023 **OR** acetaminophen (TYLENOL) suppository 650 mg, 650 mg, Rectal, Q6H PRN, Cox, Amy N, DO   albuterol (PROVENTIL) (2.5 MG/3ML) 0.083% nebulizer solution 3 mL, 3 mL, Inhalation, Q6H PRN, Cox, Amy N, DO   Chlorhexidine Gluconate Cloth 2 % PADS 6 each, 6 each, Topical, Daily, Enzo Bi, MD, 6 each at 12/03/20 1020   diltiazem (CARDIZEM CD) 24 hr capsule 120 mg, 120 mg, Oral, Daily, Cox, Amy N, DO, 120 mg at 12/03/20 0957   doxycycline (VIBRAMYCIN) 100 mg in sodium chloride 0.9 % 250 mL IVPB, 100 mg, Intravenous, Q12H, Ravishankar, Jayashree, MD, Last Rate: 125 mL/hr at 12/03/20 1013, 100 mg at 12/03/20 1013   feeding supplement (BOOST / RESOURCE BREEZE) liquid 1 Container, 1 Container, Oral, TID BM, Enzo Bi, MD, 1 Container at 12/03/20 1343   haloperidol lactate (HALDOL) injection 2 mg, 2 mg, Intravenous, Q15 min PRN, Enzo Bi, MD   hydrALAZINE (APRESOLINE) tablet 50 mg, 50 mg, Oral, Q8H, Sreenath, Sudheer B, MD, 50 mg at 12/03/20 1600   insulin aspart (novoLOG) injection 0-15 Units, 0-15 Units, Subcutaneous, TID WC, Enzo Bi, MD, 3 Units at 12/03/20 1306   insulin glargine-yfgn (SEMGLEE) injection 10 Units, 10 Units, Subcutaneous, Daily, Enzo Bi, MD, 10 Units at 12/03/20 0957   levothyroxine (SYNTHROID) tablet 100 mcg, 100 mcg, Oral, QAC breakfast, Priscella Mann, Trula Slade, MD, 100  mcg at 12/03/20 0522   magnesium oxide (MAG-OX) tablet 800 mg, 800 mg, Oral, BID, Oswald Hillock, RPH, 800 mg at 12/03/20 4650   MEDLINE mouth rinse, 15 mL, Mouth Rinse, q12n4p, Sreenath, Sudheer B, MD, 15 mL at 12/03/20 1704   mirtazapine (REMERON) tablet 15 mg, 15 mg, Oral, QHS, Sreenath, Sudheer B, MD, 15 mg at 12/02/20 2127   multivitamin with minerals tablet 1 tablet, 1 tablet,  Oral, Daily, Sreenath, Sudheer B, MD, 1 tablet at 12/03/20 0957   ondansetron (ZOFRAN) tablet 4 mg, 4 mg, Oral, Q6H PRN **OR** ondansetron (ZOFRAN) injection 4 mg, 4 mg, Intravenous, Q6H PRN, Cox, Amy N, DO, 4 mg at 11/23/20 0751   pantoprazole (PROTONIX) EC tablet 40 mg, 40 mg, Oral, Daily, Cox, Amy N, DO, 40 mg at 12/03/20 1021   polyethylene glycol (MIRALAX / GLYCOLAX) packet 34 g, 34 g, Oral, BID PRN, Enzo Bi, MD   rosuvastatin (CRESTOR) tablet 10 mg, 10 mg, Oral, QHS, Cox, Amy N, DO, 10 mg at 12/02/20 2127   sodium chloride flush (NS) 0.9 % injection 10-40 mL, 10-40 mL, Intracatheter, Q12H, Enzo Bi, MD, 10 mL at 12/03/20 1021   sodium chloride flush (NS) 0.9 % injection 10-40 mL, 10-40 mL, Intracatheter, PRN, Enzo Bi, MD   thiamine tablet 100 mg, 100 mg, Oral, Daily, Oswald Hillock, RPH, 100 mg at 12/03/20 0957   Family History  Problem Relation Age of Onset   Cancer Paternal Aunt    Breast cancer Maternal Aunt 70    Social History   Socioeconomic History   Marital status: Single    Spouse name: Not on file   Number of children: Not on file   Years of education: Not on file   Highest education level: Not on file  Occupational History   Not on file  Tobacco Use   Smoking status: Never   Smokeless tobacco: Never  Vaping Use   Vaping Use: Never used  Substance and Sexual Activity   Alcohol use: Yes    Alcohol/week: 1.0 standard drink    Types: 1 Cans of beer per week   Drug use: No   Sexual activity: Not Currently    Birth control/protection: None  Other Topics Concern   Not on file  Social History Narrative   Not on file   Social Determinants of Health   Financial Resource Strain: Not on file  Food Insecurity: Not on file  Transportation Needs: Not on file  Physical Activity: Not on file  Stress: Not on file  Social Connections: Not on file    Review of Systems: A 12 point ROS discussed and pertinent positives are indicated in the HPI above.  All other  systems are negative.  Review of Systems  Vital Signs: BP 133/76   Pulse 75   Temp (!) 100.5 F (38.1 C)   Resp 17   Ht 6' (1.829 m)   Wt 208 lb 1.8 oz (94.4 kg)   SpO2 99%   BMI 28.23 kg/m   Physical Exam Constitutional:      Appearance: Normal appearance.  HENT:     Head: Normocephalic and atraumatic.  Cardiovascular:     Rate and Rhythm: Normal rate.  Pulmonary:     Effort: No respiratory distress.     Breath sounds: Normal breath sounds.  Neurological:     Mental Status: She is alert and oriented to person, place, and time.   Imaging: CT Head Wo Contrast  Result Date: 11/19/2020 CLINICAL  DATA:  Mental status changes of unknown cause. EXAM: CT HEAD WITHOUT CONTRAST TECHNIQUE: Contiguous axial images were obtained from the base of the skull through the vertex without intravenous contrast. COMPARISON:  10/02/2020 FINDINGS: Brain: The brain shows a normal appearance without evidence of malformation, atrophy, old or acute small or large vessel infarction, mass lesion, hemorrhage, hydrocephalus or extra-axial collection. Vascular: No hyperdense vessel. No evidence of atherosclerotic calcification. Skull: Normal.  No traumatic finding.  No focal bone lesion. Sinuses/Orbits: Sinuses are clear. Orbits appear normal. Mastoids are clear. Other: None significant IMPRESSION: Normal head CT. Electronically Signed   By: Nelson Chimes M.D.   On: 11/19/2020 16:19   CT HEAD W & WO CONTRAST  Result Date: 11/21/2020 CLINICAL DATA:  Metastatic disease evaluation. Additional provided: Patient with history of recent pneumococcal meningitis and bacteremia, diabetes mellitus presenting with altered mental status. EXAM: CT HEAD WITHOUT AND WITH CONTRAST TECHNIQUE: Contiguous axial images were obtained from the base of the skull through the vertex without and with intravenous contrast CONTRAST:  17m OMNIPAQUE IOHEXOL 350 MG/ML SOLN COMPARISON:  Prior head CT examinations 11/19/2020 and earlier.  FINDINGS: Brain: Cerebral volume is normal for age. There is no acute intracranial hemorrhage. No demarcated cortical infarct. No extra-axial fluid collection. No evidence of an intracranial mass. No midline shift. No pathologic intracranial enhancement is identified. Vascular: Atherosclerotic calcifications. Skull: Normal. Negative for fracture or focal suspicious osseous lesion. Sinuses/Orbits: Visualized orbits show no acute finding. Minimal frothy secretions within the left sphenoid sinus. Small right sphenoid sinus mucous retention cyst. Minimal partial opacification of the posterior right ethmoid air cells. IMPRESSION: No evidence of acute intracranial abnormality. No CT evidence of intracranial metastatic disease. Mild paranasal sinus disease at the imaged levels, as described. Electronically Signed   By: KKellie SimmeringDO   On: 11/21/2020 15:42   MR BRAIN W WO CONTRAST  Result Date: 11/28/2020 CLINICAL DATA:  Mental status change, unknown cause; encephalopathy of unknown cause; recurrent fever. EXAM: MRI HEAD WITHOUT AND WITH CONTRAST TECHNIQUE: Multiplanar, multiecho pulse sequences of the brain and surrounding structures were obtained without and with intravenous contrast. CONTRAST:  85mGADAVIST GADOBUTROL 1 MMOL/ML IV SOLN COMPARISON:  Head CT November 21, 2020 FINDINGS: Brain: Bilateral T2 hyperintense subdural fluid collections measuring up to 5 mm on the right and 6 mm on the left with mild mass effect on the adjacent brain parenchyma without midline shift. Postcontrast images are severely degraded by motion. Mild diffuse pachymeningeal contrast enhancement seen. The brain parenchyma has normal morphology and signal characteristics. No acute infarction, hemorrhage, hydrocephalus, or mass lesion. Vascular: Normal flow voids. Skull and upper cervical spine: Normal marrow signal. Sinuses/Orbits: Negative. Other: None. IMPRESSION: Interval development of small bilateral subdural fluid collections, may be  related to recent lumbar puncture. Electronically Signed   By: KaPedro Earls.D.   On: 11/28/2020 14:54   MR CERVICAL SPINE W WO CONTRAST  Result Date: 11/28/2020 CLINICAL DATA:  History of epidural abscess and paraspinal abscess in the setting of recurrent fever and altered mental status. EXAM: MRI CERVICAL, THORACIC AND LUMBAR SPINE WITHOUT AND WITH CONTRAST TECHNIQUE: Multiplanar and multiecho pulse sequences of the cervical spine, to include the craniocervical junction and cervicothoracic junction, and thoracic and lumbar spine, were obtained without and with intravenous contrast. CONTRAST:  67m24mADAVIST GADOBUTROL 1 MMOL/ML IV SOLN COMPARISON:  MRI of the cervical, thoracic and lumbar spine June 17, 2020. FINDINGS: The study is partially degraded by motion. Postcontrast axial images are  essentially nondiagnostic. MRI CERVICAL SPINE FINDINGS Alignment: Physiologic. Vertebrae: No fracture, evidence of discitis, or bone lesion. Cord: Increased T2 signal within the cord at the C3 level. Posterior Fossa, vertebral arteries, paraspinal tissues: Negative. Disc levels: C2-3: Posterior disc protrusion causing mild mass effect on the cord without significant spinal canal stenosis. Facet degenerative changes resulting mild left neural foraminal narrowing. C3-4: Left posterolateral disc protrusion causing moderate to severe spinal canal stenosis with mass effect on the cord. Increased T2 signal is seen within the cord immediately above the level of compression, likely edema. Uncovertebral and facet degenerative changes resulting in moderate right and severe left neural foraminal narrowing. C4-5: Posterior disc protrusion asymmetric to the left causing mass effect on the cord and resulting in moderate spinal canal stenosis. Uncovertebral and facet degenerative changes resulting in severe bilateral neural foraminal narrowing. C5-6: Posterior disc osteophyte complex causing mass effect on the cord and  resulting in moderate spinal canal stenosis. Uncovertebral and facet degenerative changes resulting severe bilateral neural foraminal narrowing. C6-7: Left posterolateral disc protrusion causing indentation of the thecal sac and resulting mild spinal canal stenosis. Uncovertebral and facet degenerative changes resulting in mild bilateral neural foraminal narrowing. C7-T1: Left posterolateral disc protrusion causing indentation on the thecal sac and resulting in mild left neural foraminal narrowing. No significant spinal canal stenosis. MRI THORACIC SPINE FINDINGS Alignment:  Physiologic. Vertebrae: No fracture, evidence of discitis, or bone lesion. Postsurgical changes from T3-4, T5-7 and T9-10 laminectomies. Interval resolution of the large posterior epidural fluid collection. Cord: Central increased T2 signal within the cord at T6-7, T7-8 and from the T8-9 level to the conus medullaris. Paraspinal and other soft tissues: Small bilateral pleural effusion. A 1.5 cm T2 hyperintense hepatic lesion. Disc levels: Tiny posterior disc protrusions at T3-4, T5-6, T6-7 and T7-8 causing minimal indentation on the thecal sac. No significant spinal canal or neural foraminal stenosis at any thoracic level. MRI LUMBAR SPINE FINDINGS Segmentation:  Standard. Alignment:  Physiologic. Vertebrae: No fracture, evidence of discitis, or bone lesion. Endplate degenerative changes at L5-S1. Conus medullaris and cauda equina: Conus extends to the L1-2 level. Increased T2 signal within the conus medullaris, as described above. 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 Paraspinal and other soft tissues: Negative. Disc levels: T12-L1: No spinal canal or neural foraminal stenosis. L1-2: No spinal canal or neural foraminal stenosis. L2-3: Shallow disc bulge and moderate facet degenerative changes without significant spinal canal or neural foraminal stenosis. L3-4: Moderate facet  degenerative changes. No spinal canal or neural foraminal stenosis. L4-5: Mild loss of disc height, disc bulge and moderate facet degenerative changes without significant spinal canal or neural foraminal stenosis. L5-S1: Loss of disc height, disc bulge with superimposed left central/subarticular disc protrusion and moderate facet degenerative changes. Findings result in narrowing of the left subarticular zone with displacement of the traversing left S1 nerve root. No significant neural foraminal narrowing. IMPRESSION: 1. The study is degraded by motion, particularly the postcontrast images. 2. Interval resolution of the large posterior thoracic epidural fluid collection with postsurgical changes from multilevel laminectomy noted in the thoracic spine. No new fluid collection identified. 3. Increased T2 signal within the cord centrally at T6-7, T7-8 and from the T8-9 level to the conus medullaris without cord expansion. This may represent post ischemic changes related to now resolved cord compression. 4. 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 5.  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 6. Multilevel high-grade neural foraminal narrowing at the cervical spine. 7. Mild degenerative changes of the thoracic spine without spinal canal or neural foraminal stenosis. 8. Degenerative disc disease at L5-S1 with narrowing of the left subarticular zone and likely impingement of the traversing left S1 nerve root. Electronically Signed   By: Pedro Earls M.D.   On: 11/28/2020 15:41   MR THORACIC SPINE W WO CONTRAST  Result Date: 11/28/2020 CLINICAL DATA:  History of epidural abscess and paraspinal abscess in the setting of recurrent fever and altered mental status. EXAM: MRI CERVICAL, THORACIC AND LUMBAR SPINE WITHOUT AND WITH CONTRAST  TECHNIQUE: Multiplanar and multiecho pulse sequences of the cervical spine, to include the craniocervical junction and cervicothoracic junction, and thoracic and lumbar spine, were obtained without and with intravenous contrast. CONTRAST:  103m GADAVIST GADOBUTROL 1 MMOL/ML IV SOLN COMPARISON:  MRI of the cervical, thoracic and lumbar spine June 17, 2020. FINDINGS: The study is partially degraded by motion. Postcontrast axial images are essentially nondiagnostic. MRI CERVICAL SPINE FINDINGS Alignment: Physiologic. Vertebrae: No fracture, evidence of discitis, or bone lesion. Cord: Increased T2 signal within the cord at the C3 level. Posterior Fossa, vertebral arteries, paraspinal tissues: Negative. Disc levels: C2-3: Posterior disc protrusion causing mild mass effect on the cord without significant spinal canal stenosis. Facet degenerative changes resulting mild left neural foraminal narrowing. C3-4: Left posterolateral disc protrusion causing moderate to severe spinal canal stenosis with mass effect on the cord. Increased T2 signal is seen within the cord immediately above the level of compression, likely edema. Uncovertebral and facet degenerative changes resulting in moderate right and severe left neural foraminal narrowing. C4-5: Posterior disc protrusion asymmetric to the left causing mass effect on the cord and resulting in moderate spinal canal stenosis. Uncovertebral and facet degenerative changes resulting in severe bilateral neural foraminal narrowing. C5-6: Posterior disc osteophyte complex causing mass effect on the cord and resulting in moderate spinal canal stenosis. Uncovertebral and facet degenerative changes resulting severe bilateral neural foraminal narrowing. C6-7: Left posterolateral disc protrusion causing indentation of the thecal sac and resulting mild spinal canal stenosis. Uncovertebral and facet degenerative changes resulting in mild bilateral neural foraminal narrowing. C7-T1: Left  posterolateral disc protrusion causing indentation on the thecal sac and resulting in mild left neural foraminal narrowing. No significant spinal canal stenosis. MRI THORACIC SPINE FINDINGS Alignment:  Physiologic. Vertebrae: No fracture, evidence of discitis, or bone lesion. Postsurgical changes from T3-4, T5-7 and T9-10 laminectomies. Interval resolution of the large posterior epidural fluid collection. Cord: Central increased T2 signal within the cord at T6-7, T7-8 and from the T8-9 level to the conus medullaris. Paraspinal and other soft tissues: Small bilateral pleural effusion. A 1.5 cm T2 hyperintense hepatic lesion. Disc levels: Tiny posterior disc protrusions at T3-4, T5-6, T6-7 and T7-8 causing minimal indentation on the thecal sac. No significant spinal canal or neural foraminal stenosis at any thoracic level. MRI LUMBAR SPINE FINDINGS Segmentation:  Standard. Alignment:  Physiologic. Vertebrae: No fracture, evidence of discitis, or bone lesion. Endplate degenerative changes at L5-S1. Conus medullaris and cauda equina: Conus extends to the L1-2 level. Increased T2 signal within the conus medullaris, as described above. 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 Paraspinal and other soft tissues: Negative. Disc levels: T12-L1: No spinal canal or neural foraminal stenosis. L1-2: No  spinal canal or neural foraminal stenosis. L2-3: Shallow disc bulge and moderate facet degenerative changes without significant spinal canal or neural foraminal stenosis. L3-4: Moderate facet degenerative changes. No spinal canal or neural foraminal stenosis. L4-5: Mild loss of disc height, disc bulge and moderate facet degenerative changes without significant spinal canal or neural foraminal stenosis. L5-S1: Loss of disc height, disc bulge with superimposed left central/subarticular disc protrusion and moderate facet degenerative changes. Findings result in  narrowing of the left subarticular zone with displacement of the traversing left S1 nerve root. No significant neural foraminal narrowing. IMPRESSION: 1. The study is degraded by motion, particularly the postcontrast images. 2. Interval resolution of the large posterior thoracic epidural fluid collection with postsurgical changes from multilevel laminectomy noted in the thoracic spine. No new fluid collection identified. 3. Increased T2 signal within the cord centrally at T6-7, T7-8 and from the T8-9 level to the conus medullaris without cord expansion. This may represent post ischemic changes related to now resolved cord compression. 4. 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 5. 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 6. Multilevel high-grade neural foraminal narrowing at the cervical spine. 7. Mild degenerative changes of the thoracic spine without spinal canal or neural foraminal stenosis. 8. Degenerative disc disease at L5-S1 with narrowing of the left subarticular zone and likely impingement of the traversing left S1 nerve root. Electronically Signed   By: Pedro Earls M.D.   On: 11/28/2020 15:41   MR Lumbar Spine W Wo Contrast  Result Date: 11/28/2020 CLINICAL DATA:  History of epidural abscess and paraspinal abscess in the setting of recurrent fever and altered mental status. EXAM: MRI CERVICAL, THORACIC AND LUMBAR SPINE WITHOUT AND WITH CONTRAST TECHNIQUE: Multiplanar and multiecho pulse sequences of the cervical spine, to include the craniocervical junction and cervicothoracic junction, and thoracic and lumbar spine, were obtained without and with intravenous contrast. CONTRAST:  16m GADAVIST GADOBUTROL 1 MMOL/ML IV SOLN COMPARISON:  MRI of the cervical, thoracic and lumbar spine June 17, 2020. FINDINGS: The study  is partially degraded by motion. Postcontrast axial images are essentially nondiagnostic. MRI CERVICAL SPINE FINDINGS Alignment: Physiologic. Vertebrae: No fracture, evidence of discitis, or bone lesion. Cord: Increased T2 signal within the cord at the C3 level. Posterior Fossa, vertebral arteries, paraspinal tissues: Negative. Disc levels: C2-3: Posterior disc protrusion causing mild mass effect on the cord without significant spinal canal stenosis. Facet degenerative changes resulting mild left neural foraminal narrowing. C3-4: Left posterolateral disc protrusion causing moderate to severe spinal canal stenosis with mass effect on the cord. Increased T2 signal is seen within the cord immediately above the level of compression, likely edema. Uncovertebral and facet degenerative changes resulting in moderate right and severe left neural foraminal narrowing. C4-5: Posterior disc protrusion asymmetric to the left causing mass effect on the cord and resulting in moderate spinal canal stenosis. Uncovertebral and facet degenerative changes resulting in severe bilateral neural foraminal narrowing. C5-6: Posterior disc osteophyte complex causing mass effect on the cord and resulting in moderate spinal canal stenosis. Uncovertebral and facet degenerative changes resulting severe bilateral neural foraminal narrowing. C6-7: Left posterolateral disc protrusion causing indentation of the thecal sac and resulting mild spinal canal stenosis. Uncovertebral and facet degenerative changes resulting in mild bilateral neural foraminal narrowing. C7-T1: Left posterolateral disc protrusion causing indentation on the thecal  sac and resulting in mild left neural foraminal narrowing. No significant spinal canal stenosis. MRI THORACIC SPINE FINDINGS Alignment:  Physiologic. Vertebrae: No fracture, evidence of discitis, or bone lesion. Postsurgical changes from T3-4, T5-7 and T9-10 laminectomies. Interval resolution of the large posterior  epidural fluid collection. Cord: Central increased T2 signal within the cord at T6-7, T7-8 and from the T8-9 level to the conus medullaris. Paraspinal and other soft tissues: Small bilateral pleural effusion. A 1.5 cm T2 hyperintense hepatic lesion. Disc levels: Tiny posterior disc protrusions at T3-4, T5-6, T6-7 and T7-8 causing minimal indentation on the thecal sac. No significant spinal canal or neural foraminal stenosis at any thoracic level. MRI LUMBAR SPINE FINDINGS Segmentation:  Standard. Alignment:  Physiologic. Vertebrae: No fracture, evidence of discitis, or bone lesion. Endplate degenerative changes at L5-S1. Conus medullaris and cauda equina: Conus extends to the L1-2 level. Increased T2 signal within the conus medullaris, as described above. 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 Paraspinal and other soft tissues: Negative. Disc levels: T12-L1: No spinal canal or neural foraminal stenosis. L1-2: No spinal canal or neural foraminal stenosis. L2-3: Shallow disc bulge and moderate facet degenerative changes without significant spinal canal or neural foraminal stenosis. L3-4: Moderate facet degenerative changes. No spinal canal or neural foraminal stenosis. L4-5: Mild loss of disc height, disc bulge and moderate facet degenerative changes without significant spinal canal or neural foraminal stenosis. L5-S1: Loss of disc height, disc bulge with superimposed left central/subarticular disc protrusion and moderate facet degenerative changes. Findings result in narrowing of the left subarticular zone with displacement of the traversing left S1 nerve root. No significant neural foraminal narrowing. IMPRESSION: 1. The study is degraded by motion, particularly the postcontrast images. 2. Interval resolution of the large posterior thoracic epidural fluid collection with postsurgical changes from multilevel laminectomy noted in the thoracic spine.  No new fluid collection identified. 3. Increased T2 signal within the cord centrally at T6-7, T7-8 and from the T8-9 level to the conus medullaris without cord expansion. This may represent post ischemic changes related to now resolved cord compression. 4. 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 5. 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 6. Multilevel high-grade neural foraminal narrowing at the cervical spine. 7. Mild degenerative changes of the thoracic spine without spinal canal or neural foraminal stenosis. 8. Degenerative disc disease at L5-S1 with narrowing of the left subarticular zone and likely impingement of the traversing left S1 nerve root. Electronically Signed   By: Pedro Earls M.D.   On: 11/28/2020 15:41   US RENAL  Result Date: 12/01/2020 CLINICAL DATA:  Acute kidney injury, history CLL, diabetes mellitus, hypertension EXAM: RENAL / URINARY TRACT ULTRASOUND COMPLETE COMPARISON:  CT abdomen and pelvis 11/21/2020 FINDINGS: Right Kidney: Renal measurements: 11.4 x 5.8 x 6.0 cm = volume: 206 mL. Normal cortical thickness. Increased cortical echogenicity. No mass, hydronephrosis, or shadowing calcification. Left Kidney: Renal measurements: 13.1 x 7.3 x 6.1 cm = volume: 3 L4 mL. Normal cortical thickness. Increased cortical echogenicity. Tiny cyst at mid kidney 9 x 13 x 13 mm, simple features. No additional mass, hydronephrosis, or shadowing calcification. Bladder: Partially distended. Cystic intraluminal focus at the posterior wall RIGHT of midline question RIGHT ureterocele approximately 16 x 6 x 9 mm in size.  Other: N/A IMPRESSION: Medical renal disease changes of both kidneys. Intraluminal cystic focus posterior RIGHT bladder, favor representing a small RIGHT ureterocele. Electronically Signed   By: Lavonia Dana  M.D.   On: 12/01/2020 16:26   CT CHEST ABDOMEN PELVIS W CONTRAST  Result Date: 11/21/2020 CLINICAL DATA:  Altered mental status. Recent history of pneumococcal meningitis and bacteremia. Coffee ground emesis. EXAM: CT CHEST, ABDOMEN, AND PELVIS WITH CONTRAST TECHNIQUE: Multidetector CT imaging of the chest, abdomen and pelvis was performed following the standard protocol during bolus administration of intravenous contrast. CONTRAST:  100 mL OMNIPAQUE IOHEXOL 350 MG/ML SOLN COMPARISON:  CT abdomen and pelvis 05/23/2020. FINDINGS: CT CHEST FINDINGS Cardiovascular: There is mild cardiomegaly and a small pericardial effusion. No atherosclerosis is seen. Mediastinum/Nodes: Innumerable axillary and subpectoral lymph nodes are new since the prior chest CT. Index node in the right axilla measuring 1 cm short axis dimension is seen on image 21 of series 3. A left subpectoral node measuring 1.3 cm is seen on image 24. Small mediastinal lymph nodes include a precarinal node on image 27 measuring 1 cm. The esophagus and thyroid are negative. Lungs/Pleura: There is some dependent atelectasis. A 0.7 x 0.5 cm right upper lobe nodule on image 72 of series 6 measure approximately 0.7 x 0.4 cm on the prior exam. There is extensive micronodularity in the right upper lobe. Musculoskeletal: No acute or focal abnormality CT ABDOMEN PELVIS FINDINGS Hepatobiliary: No focal liver abnormality is seen. No gallstones, gallbladder wall thickening, or biliary dilatation. The liver is diffusely low attenuating consistent with marked fatty infiltration. Pancreas: Vanessa [place] is present about the pancreas throughout is consistent with acute pancreatitis. No focal fluid collection. The pancreas enhances homogeneously. Spleen: Normal in size without focal abnormality. Adrenals/Urinary Tract: The adrenal glands appear normal. A single small cyst is seen in the left kidney. On delayed imaging, there are areas of decreased attenuation in both kidneys  which may be artifactual. No hydronephrosis. No ureteral or urinary bladder stone. Stomach/Bowel: Stomach is within normal limits. Appendix appears normal. No evidence of bowel wall thickening, distention, or inflammatory changes. Vascular/Lymphatic: No significant vascular findings are present. No enlarged abdominal or pelvic lymph nodes. Reproductive: Status post hysterectomy. No adnexal masses. Other: None. Musculoskeletal: No acute or focal bony abnormality. IMPRESSION: The examination is positive for findings consistent with acute pancreatitis without pseudocyst formation or pancreatic necrosis. Micronodularity in the right upper lobe is worrisome for bronchopneumonia. 0.7 x 0.5 cm right upper lobe nodule has mildly increased in size since 2017. Non-contrast chest CT at 6-12 months is recommended. If the nodule is stable at time of repeat CT, then future CT at 18-24 months (from today's scan) is considered optional for low-risk patients, but is recommended for high-risk patients. This recommendation follows the consensus statement: Guidelines for Management of Incidental Pulmonary Nodules Detected on CT Images: From the Fleischner Society 2017; Radiology 2017; 284:228-243. Multiple axillary and subpectoral lymph nodes are abnormal in number and likely related to this patient's history of CLL. Cardiomegaly and small pericardial effusion. Marked fatty infiltration of the liver. Decreased cortical attenuation the kidneys is likely artifactual. Correlation with urinalysis could be used to exclude pyelonephritis. Electronically Signed   By: Inge Rise M.D.   On: 11/21/2020 16:55   [place] UPPER EXTREMITY DUPLEX BILATERAL (NON-WBI)  Result Date: 11/05/2020 CLINICAL DATA:  Numbness, tingling and coldness in the upper extremities EXAM: BILATERAL UPPER EXTREMITY ARTERIAL DUPLEX SCAN TECHNIQUE: Gray-scale sonography as well as color Doppler and duplex ultrasound was performed  to evaluate the arteries of both upper  extremities. COMPARISON:  None. FINDINGS: Right upper extremity: Normal triphasic arterial waveforms throughout the subclavian, axillary, brachial, radial and ulnar artery. No focal atherosclerotic plaque. No evidence of stenosis or occlusion. Left upper extremity: Normal triphasic arterial waveforms throughout the subclavian, axillary, brachial, radial and ulnar artery. No focal atherosclerotic plaque. No evidence of stenosis or occlusion. IMPRESSION: 1. No evidence of hemodynamically significant stenosis or occlusion in either upper extremity arterial tree. 2. Incidentally and incompletely imaged prominent lymph nodes in the axillary spaces bilaterally. These are of uncertain significance and may be reactive in nature. If the patient has palpable lymphadenopathy, or other signs and symptoms suspicious for a lymphoproliferative process, then further imaging (contrast-enhanced CT scan of the chest) may be warranted. Electronically Signed   By: Jacqulynn Cadet M.D.   On: 11/05/2020 15:46   DG Chest Port 1 View  Result Date: 11/27/2020 CLINICAL DATA:  Fever and chest pain. EXAM: PORTABLE CHEST 1 VIEW COMPARISON:  And CT chest, abdomen and pelvis 11/21/2020. Single-view of the chest 10/01/2020. FINDINGS: Subtle micro nodules are seen in the right upper lobe. The left lung demonstrates minimal atelectasis in the base. Heart size is normal. No pneumothorax or pleural fluid. No acute or focal bony abnormality. IMPRESSION: Subtle focus of mild micronodularity in the right upper lobe compatible with residual infectious or inflammatory process as seen on prior CT. Recommend continued follow-up to clearing. Electronically Signed   By: Inge Rise M.D.   On: 11/27/2020 11:36   DG Chest Portable 1 View  Result Date: 11/19/2020 CLINICAL DATA:  Cough, fever EXAM: PORTABLE CHEST 1 VIEW COMPARISON:  10/01/2020 FINDINGS: Heart size is upper limits of normal. Low lung volumes. Mildly increased interstitial markings  bilaterally. No lobar consolidation. No pleural effusion or pneumothorax. IMPRESSION: Low lung volumes with mildly increased interstitial markings bilaterally may reflect mild edema or atypical/viral infection. Electronically Signed   By: Davina Poke D.O.   On: 11/19/2020 16:22   EEG adult  Result Date: 11/25/2020 Lora Havens, MD     11/25/2020  5:08 PM Patient Name: Dreya Buhrman MRN: [id number] Epilepsy Attending: Lora Havens Referring Physician/Provider: Dr Rosalin Hawking Date: 11/25/2020 Duration: 31.44 mins Patient history: 55yo F with ams. EEG to evaluate for seizure Level of alertness: Awake AEDs during EEG study: None Technical aspects: This EEG study was done with scalp electrodes positioned according to the 10-20 International system of electrode placement. Electrical activity was acquired at a sampling rate of 500Hz  and reviewed with a high frequency filter of 70Hz  and a low frequency filter of 1Hz . EEG data were recorded continuously and digitally stored. Description: No posterior dominant rhythm was seen. EEG showed continuous generalized 3 to 6 Hz theta-delta slowing. Physiologic photic driving was not seen during photic stimulation.  Hyperventilation was not performed.   ABNORMALITY - Continuous slow, generalized IMPRESSION: This study is suggestive of moderate diffuse encephalopathy, nonspecific etiology. No seizures or epileptiform discharges were seen throughout the recording. Priyanka O Yadav   [place] EKG SITE RITE  Result Date: 11/27/2020 If [facility] image not attached, placement could not be confirmed due to current cardiac rhythm.  DG FL GUIDED LUMBAR PUNCTURE  Result Date: 11/20/2020 CLINICAL DATA:  Altered mental status EXAM: DIAGNOSTIC LUMBAR PUNCTURE UNDER FLUOROSCOPIC GUIDANCE COMPARISON:  None FLUOROSCOPY TIME:  Fluoroscopy Time:  12 seconds Radiation Exposure Index (if provided by the fluoroscopic device): 1.6 mGy PROCEDURE: Informed consent was obtained from the  patient's husband prior  to the procedure, including potential complications of headache, allergy, and pain. With the patient prone, the lower back was prepped with Betadine. 1% Lidocaine was used for local anesthesia. Lumbar puncture was performed at the L4-L5 level using a 20 gauge needle with return of clear CSF. 8 ml of CSF were obtained for laboratory studies. The patient tolerated the procedure well and there were no apparent complications. IMPRESSION: Technically successful fluoroscopic guided lumbar puncture. Electronically Signed   By: Macy Mis M.D.   On: 11/20/2020 11:42    Labs:  CBC: Recent Labs    11/30/20 0503 12/01/20 0620 12/02/20 0601 12/03/20 0521  WBC 4.9 4.5 4.2 3.4*  HGB 7.7* 7.5* 7.4* 6.5*  HCT 24.3* 22.3* 22.6* 19.7*  PLT 73* 65* 63* 52*    COAGS: Recent Labs    06/10/20 1126 11/20/20 0602  INR 1.3* 1.0  APTT 28  --     BMP: Recent Labs    01/17/20 1053 05/23/20 1526 11/30/20 0503 12/01/20 0620 12/02/20 0601 12/03/20 0521  NA 140   < > 136 136 137 138  K 3.3*   < > 3.6 3.9 3.9 3.8  CL 103   < > 111 109 111 111  CO2 27   < > 23 20* 21* 22  GLUCOSE 312*   < > 161* 148* 105* 88  BUN 10   < > 14 15 16 19   CALCIUM 8.1*   < > 7.3* 7.5* 7.8* 7.6*  CREATININE 0.93   < > 1.54* 1.86* 2.06* 2.41*  GFRNONAA >60   < > 39* 31* 28* 23*  GFRAA >60  --   --   --   --   --    < > = values in this interval not displayed.    LIVER FUNCTION TESTS: Recent Labs    11/24/20 0440 11/28/20 0455 11/30/20 0503 12/03/20 1140  BILITOT 0.7 0.4 0.5 0.6  AST 23 45* 58* 62*  ALT <5 6 7 12   ALKPHOS 58 71 74 75  PROT 6.1* 5.3* 5.2* 5.0*  ALBUMIN 2.5* 2.0* 1.9* 1.9*    Assessment and Plan: Pancytopenia with history of CLL, remission since 2016 and follows with her Oncologist, Dr. Grayland Ormond  Request received for image guided bone marrow biopsy  The patient will be NPO after midnight, labs and vitals have been reviewed AKI, nephrology following AMS, improving  and today A&Ox3, Infectious disease following - meningitis and encephalitis ruled out Fevers, Blood Cx 8/4 negative, urine Cx (+) being treated with Cipro and on Doxycycline for tick bite.  Risks and benefits of CT guided bone marrow biopsy was discussed with the patient and/or patient's family including, but not limited to bleeding, infection, damage to adjacent structures or low yield requiring additional tests. All of the questions were answered and there is agreement to proceed. Consent signed and in chart.   Thank you for this interesting consult.  I greatly enjoyed meeting Paloma Grange and look forward to participating in their care.  A copy of this report was sent to the requesting provider on this date.  Electronically Signed: Hedy Jacob, PA-C 12/03/2020, 4:46 PM   I spent a total of 20 Minutes in face to face in clinical consultation, greater than 50% of which was counseling/coordinating care for pancytopenia.

## 2020-12-03 NOTE — Progress Notes (Signed)
PROGRESS NOTE    Tonya Myers  EVO:[id number] DOB: 01-08-1966 DOA: 11/19/2020 PCP: [name], MD   Brief Narrative:  55 y.o. female with medical history significant for recent pneumococcal meningitis and bacteremia, depression, hypothyroid, IDDM2, presents to the ED for chief concern of altered mentation.  Extended hospitalization.  Etiology of encephalopathy is unclear.  Encephalopathy has resolved and patient has baseline level of mentation however she began to spike intermittent fevers associated with progressive worsening of kidney function.  Nephrology and infectious disease both following.  Patient is on IV antibiotics and intravenous fluids.   Assessment & Plan:   Principal Problem:   Subacute delirium Active Problems:   DDD (degenerative disc disease), cervical   Aphasia   Anemia of chronic disease   Acute focal neurological deficit   Encephalitis   Altered mental status   Encephalopathy acute  Fever, recurrent Hx of epidural and paraspinal abscess --daily fevers  --repeat covid neg.  No cough or dyspnea to suggest PNA.  Procal increased. --MRI brain, spine 8/5, no obvious infection --UA pos for 20,000 COLONIES/mL ENTEROBACTER AEROGENES.  Started on Cipro, per ID. Plan: Ciprofloxacin x3 days per infectious disease recommendations.  On doxycycline for history of tick attachment in June 2022.  Monitor vitals and fever curve.   Acute pancreatitis Unclear etiology No reported history of alcohol intake, no radiographic evidence of gallstones Possible drug-induced however unclear exactly which drug could have driven this Initial abdominal examination negative for tenderness Lipase trending down with IVF, but much slowly than usual. Triglycerides negative Plan: On intravenous fluids for AKI.  Pancreatitis symptoms essentially resolved   Acute toxic metabolic encephalopathy, resolved Unclear etiology --meningitis ruled out by LP.  CT head neg for new stroke.   Hematology ruled out CLL as the culprit.  No seizure on EEG. --mental status dramatically improved with treatment of pancreatitis and IV thiamine repletion. 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  Urine appeared very dark.   --Cr trending up despite MIVF.  BUN remained low.  Cr up to 2.06 this morning. --nephrology consulted --renal US showed medical renal disease Plan: Nephrology following for acute kidney injury.  Currently no emergent indications for hemodialysis   hypokalemia --monitor and replete PRN   Hypomag --replete with IV mag   Mouth pain and Oral thrush, resolved Patient with clinical indications of thrush Associated with difficulty swallowing Seems to be improving Patient states poor p.o. intake is driven by appetite rather than pain --s/p Diflucan for 3 doses   Insulin-dependent diabetes mellitus Hypoglycemic episodes Patient is on long-acting formulation 50 units daily at home in addition to metformin --A1c 6.5 --hypoglycemia due to q4h SSI Plan: --cont glargine 10u daily --SSI   Hyperlipidemia --cont statin   Hypothyroid --cont Synthroid   HTN --cont cardizem and hydralazine   DVT prophylaxis: TED hose Code Status: Full code Family Communication: Spouse [name] [phone] on 8/10 Disposition Plan:Status is: Inpatient  Remains inpatient appropriate because:Inpatient level of care appropriate due to severity of illness  Dispo: The patient is from: Home              Anticipated d/c is to: Home  Patient currently is not medically stable to d/c.   Difficult to place patient  No  Encephalopathy resolved.  Intermittent fevers and worsening AKI.  Not medically stable for discharge at this time.     Level of care: Med-Surg  Consultants:  Nephrology ID  Procedures: LP  Antimicrobials:  Ciprofloxacin   Subjective: Seen and examined.  Frustrated about continued hospital stay.  Mental status much improved.  Objective: Vitals:   12/03/20 0201 12/03/20 0355 12/03/20 0806 12/03/20 1206  BP: 128/68 124/70 134/70 136/64  Pulse: 83 80 79 84  Resp: 18 16 18  (!) 22  Temp: 99.3 F (37.4 C) 99.1 F (37.3 C) 99.8 F (37.7 C) 100.2 F (37.9 C)  TempSrc: Oral     SpO2: 97% 99% 98% 98%  Weight:      Height:        Intake/Output Summary (Last 24 hours) at 12/03/2020 1252 Last data filed at 12/03/2020 0806 Gross per 24 hour  Intake 0 ml  Output 800 ml  Net -800 ml   Filed Weights   11/20/20 0507 12/01/20 0920 12/01/20 1730  Weight: 83 kg 94.9 kg 94.4 kg    Examination:  General exam: Appears calm and comfortable  Respiratory system: Clear to auscultation. Respiratory effort normal. Cardiovascular system: S1 & S2 heard, RRR. No JVD, murmurs, rubs, gallops or clicks. No pedal edema. Gastrointestinal system: Abdomen is nondistended, soft and nontender. No organomegaly or masses felt. Normal bowel sounds heard. Central nervous system: Alert and oriented. No focal neurological deficits. Extremities: Symmetric 5 x 5 power. Skin: No rashes, lesions or ulcers Psychiatry: Judgement and insight appear normal. Mood & affect appropriate.     Data Reviewed: I have personally reviewed following labs and imaging studies  CBC: Recent Labs  Lab 11/29/20 0419 11/30/20 0503 12/01/20 0620 12/02/20 0601 12/03/20 0521  WBC 5.4 4.9 4.5 4.2 3.4*  NEUTROABS  --   --   --  3.1 2.6  HGB 7.6* 7.7* 7.5* 7.4* 6.5*  HCT 22.8* 24.3* 22.3* 22.6* 19.7*  MCV 82.6 81.8 83.2 82.8 83.5  PLT 79* 73* 65* 63* 52*   Basic Metabolic Panel: Recent Labs  Lab 11/27/20 0458  11/28/20 0455 11/29/20 0419 11/30/20 0503 12/01/20 0620 12/02/20 0601 12/03/20 0521  NA 136 136 137 136 136 137 138  K 3.5 3.4* 4.0 3.6 3.9 3.9 3.8  CL 106 108 110 111 109 111 111  CO2 24 25 23 23  20* 21* 22  GLUCOSE 84 165* 91 161* 148* 105* 88  BUN 8 12 12 14 15 16 19   CREATININE 1.00 1.35* 1.38* 1.54* 1.86* 2.06* 2.41*  CALCIUM 7.8* 7.5* 7.2* 7.3* 7.5* 7.8* 7.6*  MG 1.5* 1.6* 2.4 2.2 1.8 2.0 1.8  PHOS 3.1 3.2  --   --   --   --   --    GFR: Estimated Creatinine Clearance: 33.6 mL/min (A) (by C-G formula based on SCr of 2.41 mg/dL (H)). Liver Function Tests: Recent Labs  Lab 11/28/20 0455 11/30/20 0503 12/03/20 1140  AST 45* 58* 62*  ALT 6 7 12   ALKPHOS 71 74 75  BILITOT 0.4 0.5 0.6  PROT 5.3* 5.2* 5.0*  ALBUMIN 2.0* 1.9* 1.9*   Recent Labs  Lab 11/29/20 0419 11/30/20 0503 12/01/20 0620 12/02/20 0601 12/03/20 0521  LIPASE 209* 306* 270* 227* 190*   No results for input(s): AMMONIA in the last 168 hours. Coagulation Profile: No results for input(s): INR, PROTIME in the last 168 hours. Cardiac Enzymes: Recent  Labs  Lab 12/03/20 1140  CKTOTAL 19*   BNP (last 3 results) No results for input(s): PROBNP in the last 8760 hours. HbA1C: No results for input(s): HGBA1C in the last 72 hours. CBG: Recent Labs  Lab 12/02/20 1131 12/02/20 1640 12/02/20 2137 12/03/20 0808 12/03/20 1203  GLUCAP 198* 128* 101* 84 171*   Lipid Profile: No results for input(s): CHOL, HDL, LDLCALC, TRIG, CHOLHDL, LDLDIRECT in the last 72 hours. Thyroid Function Tests: No results for input(s): TSH, T4TOTAL, FREET4, T3FREE, THYROIDAB in the last 72 hours. Anemia Panel: No results for input(s): VITAMINB12, FOLATE, FERRITIN, TIBC, IRON, RETICCTPCT in the last 72 hours. Sepsis Labs: Recent Labs  Lab 11/28/20 0455 11/29/20 0419 12/02/20 0601 12/03/20 0521  PROCALCITON 2.07 1.80 1.74 1.71    Recent Results (from the past 240 hour(s))  Resp Panel by RT-PCR (Flu A&B, Covid)  Nasopharyngeal Swab     Status: None   Collection Time: 11/27/20  1:39 PM   Specimen: Nasopharyngeal Swab; Nasopharyngeal(NP) swabs in vial transport medium  Result Value Ref Range Status   SARS Coronavirus 2 by RT PCR NEGATIVE NEGATIVE Final    Comment: (NOTE) SARS-CoV-2 target nucleic acids are NOT DETECTED.  The SARS-CoV-2 RNA is generally detectable in upper respiratory specimens during the acute phase of infection. The lowest concentration of SARS-CoV-2 viral copies this assay can detect is 138 copies/mL. A negative result does not preclude SARS-Cov-2 infection and should not be used as the sole basis for treatment or other patient management decisions. A negative result may occur with  improper specimen collection/handling, submission of specimen other than nasopharyngeal swab, presence of viral mutation(s) within the areas targeted by this assay, and inadequate number of viral copies(<138 copies/mL). A negative result must be combined with clinical observations, patient history, and epidemiological information. The expected result is Negative.  Fact Sheet for Patients:  EntrepreneurPulse.com.au  Fact Sheet for Healthcare Providers:  IncredibleEmployment.be  This test is no t yet approved or cleared by the [place] FDA and  has been authorized for detection and/or diagnosis of SARS-CoV-2 by FDA under an Emergency Use Authorization (EUA). This EUA will remain  in effect (meaning this test can be used) for the duration of the COVID-19 declaration under Section 564(b)(1) of the Act, 21 U.S.C.section 360bbb-3(b)(1), unless the authorization is terminated  or revoked sooner.       Influenza A by PCR NEGATIVE NEGATIVE Final   Influenza B by PCR NEGATIVE NEGATIVE Final    Comment: (NOTE) The Xpert Xpress SARS-CoV-2/FLU/RSV plus assay is intended as an aid in the diagnosis of influenza from Nasopharyngeal swab specimens and should not be  used as a sole basis for treatment. Nasal washings and aspirates are unacceptable for Xpert Xpress SARS-CoV-2/FLU/RSV testing.  Fact Sheet for Patients: EntrepreneurPulse.com.au  Fact Sheet for Healthcare Providers: IncredibleEmployment.be  This test is not yet approved or cleared by the [place] FDA and has been authorized for detection and/or diagnosis of SARS-CoV-2 by FDA under an Emergency Use Authorization (EUA). This EUA will remain in effect (meaning this test can be used) for the duration of the COVID-19 declaration under Section 564(b)(1) of the Act, 21 U.S.C. section 360bbb-3(b)(1), unless the authorization is terminated or revoked.  Performed at [facility], [address] [place], [place] [address]   Urine Culture     Status: Abnormal   Collection Time: 11/27/20  2:15 PM   Specimen: Urine, Clean Catch  Result Value Ref Range Status   Specimen Description  Final    URINE, CLEAN CATCH Performed at [facility], [address] [place], [place] [address]    Special Requests   Final    NONE Performed at [facility], [address] [place], [place] [address]    Culture 20,000 COLONIES/mL ENTEROBACTER AEROGENES (A)  Final   Report Status 11/30/2020 FINAL  Final   Organism ID, Bacteria ENTEROBACTER AEROGENES (A)  Final      Susceptibility   Enterobacter aerogenes - MIC*    CEFAZOLIN >=64 RESISTANT Resistant     CEFEPIME 1 SENSITIVE Sensitive     CEFTRIAXONE >=64 RESISTANT Resistant     CIPROFLOXACIN <=0.25 SENSITIVE Sensitive     GENTAMICIN <=1 SENSITIVE Sensitive     IMIPENEM <=0.25 SENSITIVE Sensitive     NITROFURANTOIN 64 INTERMEDIATE Intermediate     TRIMETH/SULFA <=20 SENSITIVE Sensitive     PIP/TAZO >=128 RESISTANT Resistant     * 20,000 COLONIES/mL ENTEROBACTER AEROGENES  CULTURE, BLOOD (ROUTINE X 2) w Reflex to ID Panel     Status: None   Collection Time: 11/27/20  8:23 PM    Specimen: BLOOD  Result Value Ref Range Status   Specimen Description BLOOD LAC  Final   Special Requests   Final    BOTTLES DRAWN AEROBIC AND ANAEROBIC Blood Culture results may not be optimal due to an inadequate volume of blood received in culture bottles   Culture   Final    NO GROWTH 5 DAYS Performed at [facility], [address] [place], [place] [address]    Report Status 12/02/2020 FINAL  Final  CULTURE, BLOOD (ROUTINE X 2) w Reflex to ID Panel     Status: None   Collection Time: 11/27/20  9:32 PM   Specimen: BLOOD LEFT HAND  Result Value Ref Range Status   Specimen Description BLOOD LEFT HAND  Final   Special Requests   Final    BOTTLES DRAWN AEROBIC AND ANAEROBIC Blood Culture adequate volume   Culture   Final    NO GROWTH 5 DAYS Performed at [facility], [address], [place], [place] [address]    Report Status 12/02/2020 FINAL  Final         Radiology Studies: US RENAL  Result Date: 12/01/2020 CLINICAL DATA:  Acute kidney injury, history CLL, diabetes mellitus, hypertension EXAM: RENAL / URINARY TRACT ULTRASOUND COMPLETE COMPARISON:  CT abdomen and pelvis 11/21/2020 FINDINGS: Right Kidney: Renal measurements: 11.4 x 5.8 x 6.0 cm = volume: 206 mL. Normal cortical thickness. Increased cortical echogenicity. No mass, hydronephrosis, or shadowing calcification. Left Kidney: Renal measurements: 13.1 x 7.3 x 6.1 cm = volume: 3 L4 mL. Normal cortical thickness. Increased cortical echogenicity. Tiny cyst at mid kidney 9 x 13 x 13 mm, simple features. No additional mass, hydronephrosis, or shadowing calcification. Bladder: Partially distended. Cystic intraluminal focus at the posterior wall RIGHT of midline question RIGHT ureterocele approximately 16 x 6 x 9 mm in size. Other: N/A IMPRESSION: Medical renal disease changes of both kidneys. Intraluminal cystic focus posterior RIGHT bladder, favor representing a small RIGHT ureterocele. Electronically Signed    By: Lavonia Dana M.D.   On: 12/01/2020 16:26        Scheduled Meds:  sodium chloride   Intravenous Once   Chlorhexidine Gluconate Cloth  6 each Topical Daily   diltiazem  120 mg Oral Daily   feeding supplement  1 Container Oral TID BM   hydrALAZINE  50 mg Oral Q8H   insulin  aspart  0-15 Units Subcutaneous TID WC   insulin glargine-yfgn  10 Units Subcutaneous Daily   levothyroxine  100 mcg Oral QAC breakfast   magnesium oxide  800 mg Oral BID   mouth rinse  15 mL Mouth Rinse q12n4p   mirtazapine  15 mg Oral QHS   multivitamin with minerals  1 tablet Oral Daily   pantoprazole  40 mg Oral Daily   rosuvastatin  10 mg Oral QHS   sodium chloride flush  10-40 mL Intracatheter Q12H   thiamine  100 mg Oral Daily   Continuous Infusions:  doxycycline (VIBRAMYCIN) IV 100 mg (12/03/20 1013)     LOS: 13 days    Time spent: 25 minutes    Sidney Ace, MD [facility] Pager [phone]  If 7PM-7AM, please contact night-coverage 12/03/2020, 12:52 PM

## 2020-12-03 NOTE — Progress Notes (Signed)
PT Cancellation Note  Patient Details Name: Tonya Myers MRN: [id number] DOB: [birth year]   Cancelled Treatment:     PT attempt PT hold. Pt has low HgB noted and will require transfusion later in day. Pt reports she has had a long day and requested to hold all therapy. Acute PT will return tomorrow and continue to follow per current POC.     Willette Pa 12/03/2020, 2:04 PM

## 2020-12-03 NOTE — Progress Notes (Signed)
Date of Admission:  11/19/2020     ID: Tonya Myers is a 55 y.o. female  Principal Problem:   Subacute delirium Active Problems:   DDD (degenerative disc disease), cervical   Aphasia   Anemia of chronic disease   Acute focal neurological deficit   Encephalitis   Altered mental status   Encephalopathy acute    Subjective: Patient continues to have fever even though she does not experience it. Her appetite is improved Urine color is dark No pain abdomen No headache    Medications:   sodium chloride   Intravenous Once   Chlorhexidine Gluconate Cloth  6 each Topical Daily   diltiazem  120 mg Oral Daily   feeding supplement  1 Container Oral TID BM   hydrALAZINE  50 mg Oral Q8H   insulin aspart  0-15 Units Subcutaneous TID WC   insulin glargine-yfgn  10 Units Subcutaneous Daily   levothyroxine  100 mcg Oral QAC breakfast   magnesium oxide  800 mg Oral BID   mouth rinse  15 mL Mouth Rinse q12n4p   mirtazapine  15 mg Oral QHS   multivitamin with minerals  1 tablet Oral Daily   pantoprazole  40 mg Oral Daily   rosuvastatin  10 mg Oral QHS   sodium chloride flush  10-40 mL Intracatheter Q12H   thiamine  100 mg Oral Daily    Objective: Vital signs in last 24 hours: Temp:  [98.8 F (37.1 C)-102.1 F (38.9 C)] 99.8 F (37.7 C) (08/10 0806) Pulse Rate:  [79-90] 79 (08/10 0806) Resp:  [16-20] 18 (08/10 0806) BP: (112-148)/(63-73) 134/70 (08/10 0806) SpO2:  [96 %-99 %] 98 % (08/10 0806)  PHYSICAL EXAM:  General: Alert, cooperative, no distress, pale Head: Normocephalic, without obvious abnormality, atraumatic. Eyes: Conjunctivae clear, anicteric sclerae. Pupils are equal ENT Nares normal. No drainage or sinus tenderness. Lips, mucosa, and tongue normal. No Thrush Neck: Supple, symmetrical, no adenopathy, thyroid: non tender no carotid bruit and no JVD. Back: No CVA tenderness. Lungs: Clear to auscultation bilaterally. No Wheezing or Rhonchi. No rales. Heart:  Regular rate and rhythm, no murmur, rub or gallop. Abdomen: Soft, non-tender,not distended. Bowel sounds normal. No masses Extremities: Edema legs Skin: No rashes or lesions. Or bruising Lymph: Cervical, supraclavicular normal. Neurologic: Grossly non-focal  Lab Results Recent Labs    12/02/20 0601 12/03/20 0521  WBC 4.2 3.4*  HGB 7.4* 6.5*  HCT 22.6* 19.7*  NA 137 138  K 3.9 3.8  CL 111 111  CO2 21* 22  BUN 16 19  CREATININE 2.06* 2.41*     Micro 11/19/2020 blood culture no growth 11/20/2020 CSF culture no growth HSV PCR in CSF nonreactive VDRL nonreactive Crypto antigen nonreactive 11/12/2020 meningitis encephalitis panel nonreactive. 11/27/2020 urine culture Enterobacter serologies 11/27/2020 blood culture no growth Studies/Results: US RENAL  Result Date: 12/01/2020 CLINICAL DATA:  Acute kidney injury, history CLL, diabetes mellitus, hypertension EXAM: RENAL / URINARY TRACT ULTRASOUND COMPLETE COMPARISON:  CT abdomen and pelvis 11/21/2020 FINDINGS: Right Kidney: Renal measurements: 11.4 x 5.8 x 6.0 cm = volume: 206 mL. Normal cortical thickness. Increased cortical echogenicity. No mass, hydronephrosis, or shadowing calcification. Left Kidney: Renal measurements: 13.1 x 7.3 x 6.1 cm = volume: 3 L4 mL. Normal cortical thickness. Increased cortical echogenicity. Tiny cyst at mid kidney 9 x 13 x 13 mm, simple features. No additional mass, hydronephrosis, or shadowing calcification. Bladder: Partially distended. Cystic intraluminal focus at the posterior wall RIGHT of midline question RIGHT ureterocele approximately 16  x 6 x 9 mm in size. Other: N/A IMPRESSION: Medical renal disease changes of both kidneys. Intraluminal cystic focus posterior RIGHT bladder, favor representing a small RIGHT ureterocele. Electronically Signed   By: Lavonia Dana M.D.   On: 12/01/2020 16:26     Assessment/Plan: Patient initially presented with encephalopathy.  Encephalitis and bacterial meningitis were ruled  out.  She received high-dose thiamine which seem to have improved her condition. Pancytopenia on admission and it persist.  Initially WBC had recovered but it is dropping again.  No evidence of TTP or H LH  Intermittent fever Infectious work-up revealed Enterobacter aerogenes in the urine and she was treated like her UTI cystitis with ciprofloxacin with no change in the fever curve. She was also given doxycycline because she gave a history of tick attachment and that did not make any difference as well.  Hence the concern is whether this is CLL related. She is going to need a bone marrow biopsy  Pancreatitis incidentally noted on the CT abdomen with high lipase.  Patient did have some abdominal pain but that has resolved now.  AKI patient had normal creatinine on admission it is gotten worse now.  Thought to be due to contrast.  Nephrology following patient.    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  She was treated completely with 6 weeks of IV antibiotics. Repeat MRI done during this admission does not show any residual infection.  Discussed the management with the patient, husband on the care team.

## 2020-12-03 NOTE — Progress Notes (Signed)
   12/03/20 0014  Assess: MEWS Score  Temp (!) 102.1 F (38.9 C)  BP (!) 148/67  Pulse Rate 90  Resp 20  SpO2 98 %  Assess: MEWS Score  MEWS Temp 2  MEWS Systolic 0  MEWS Pulse 0  MEWS RR 0  MEWS LOC 0  MEWS Score 2  MEWS Score Color Yellow  Assess: if the MEWS score is Yellow or Red  Were vital signs taken at a resting state? Yes  Focused Assessment No change from prior assessment  Does the patient meet 2 or more of the SIRS criteria? No  Does the patient have a confirmed or suspected source of infection? No  Provider and Rapid Response Notified? No  MEWS guidelines implemented *See Row Information* Yes  Treat  MEWS Interventions Escalated (See documentation below)  Pain Score 0  Take Vital Signs  Increase Vital Sign Frequency  Yellow: Q 2hr X 2 then Q 4hr X 2, if remains yellow, continue Q 4hrs  Escalate  MEWS: Escalate Yellow: discuss with charge nurse/RN and consider discussing with provider and RRT  Notify: Charge Nurse/RN  Name of Charge Nurse/RN Notified Orvil Feil, RN  Date Charge Nurse/RN Notified 12/03/20  Time Charge Nurse/RN Notified 0014  Document  Patient Outcome Other (Comment) (PRN tylenol given for fever)  Progress note created (see row info) Yes  Assess: SIRS CRITERIA  SIRS Temperature  1  SIRS Pulse 0  SIRS Respirations  0  SIRS WBC 0  SIRS Score Sum  1  Patient with temperature 102.1 tylenol given, no complaints of pain, will continue to monitor.

## 2020-12-03 NOTE — Progress Notes (Signed)
[facility]  ROUNDING NOTE   Subjective:   Tonya Myers is a 55 y.o. female with medical conditions including pneumococcal meningitis bacteremia, hypothyroidism, Type 2 diabetes, and lymphoma in remission. She presents to the ED with confusion and is admitted for Altered mental status [R41.82] Encephalopathy acute [G93.40] Altered mental status, unspecified altered mental status type [R41.82] Sepsis with encephalopathy without septic shock, due to unspecified organism ([place]) [A41.9, R65.20, G93.40]  Patient is seen resting in bed Alert and oriented Tolerating meals without nausea Denies shortness of breath Concerned that renal function has not improved   Objective:  Vital signs in last 24 hours:  Temp:  [98.8 F (37.1 C)-102.1 F (38.9 C)] 100.2 F (37.9 C) (08/10 1206) Pulse Rate:  [79-90] 84 (08/10 1206) Resp:  [16-22] 22 (08/10 1206) BP: (112-148)/(63-73) 136/64 (08/10 1206) SpO2:  [96 %-99 %] 98 % (08/10 1206)  Weight change:  Filed Weights   11/20/20 0507 12/01/20 0920 12/01/20 1730  Weight: 83 kg 94.9 kg 94.4 kg    Intake/Output: I/O last 3 completed shifts: In: 500 [P.O.:240; I.V.:10; IV Piggyback:250] Out: 1400 [Urine:1400]   Intake/Output this shift:  Total I/O In: -  Out: 400 [Urine:400]  Physical Exam: General: NAD, resting in bed  Head: Normocephalic, atraumatic. Moist oral mucosal membranes  Eyes: Anicteric  Lungs:  Clear to auscultation, normal effort  Heart: Regular rate and rhythm  Abdomen:  Soft, nontender  Extremities: trace peripheral edema.  Neurologic: Alert, moving all four extremities  Skin: No lesions       Basic Metabolic Panel: Recent Labs  Lab 11/27/20 0458 11/28/20 0455 11/29/20 0419 11/30/20 0503 12/01/20 0620 12/02/20 0601 12/03/20 0521  NA 136 136 137 136 136 137 138  K 3.5 3.4* 4.0 3.6 3.9 3.9 3.8  CL 106 108 110 111 109 111 111  CO2 24 25 23 23  20* 21* 22  GLUCOSE 84 165* 91 161* 148* 105* 88  BUN  8 12 12 14 15 16 19   CREATININE 1.00 1.35* 1.38* 1.54* 1.86* 2.06* 2.41*  CALCIUM 7.8* 7.5* 7.2* 7.3* 7.5* 7.8* 7.6*  MG 1.5* 1.6* 2.4 2.2 1.8 2.0 1.8  PHOS 3.1 3.2  --   --   --   --   --      Liver Function Tests: Recent Labs  Lab 11/28/20 0455 11/30/20 0503 12/03/20 1140  AST 45* 58* 62*  ALT 6 7 12   ALKPHOS 71 74 75  BILITOT 0.4 0.5 0.6  PROT 5.3* 5.2* 5.0*  ALBUMIN 2.0* 1.9* 1.9*    Recent Labs  Lab 11/29/20 0419 11/30/20 0503 12/01/20 0620 12/02/20 0601 12/03/20 0521  LIPASE 209* 306* 270* 227* 190*    No results for input(s): AMMONIA in the last 168 hours.  CBC: Recent Labs  Lab 11/29/20 0419 11/30/20 0503 12/01/20 0620 12/02/20 0601 12/03/20 0521  WBC 5.4 4.9 4.5 4.2 3.4*  NEUTROABS  --   --   --  3.1 2.6  HGB 7.6* 7.7* 7.5* 7.4* 6.5*  HCT 22.8* 24.3* 22.3* 22.6* 19.7*  MCV 82.6 81.8 83.2 82.8 83.5  PLT 79* 73* 65* 63* 52*     Cardiac Enzymes: Recent Labs  Lab 12/03/20 1140  CKTOTAL 19*    BNP: Invalid input(s): POCBNP  CBG: Recent Labs  Lab 12/02/20 1131 12/02/20 1640 12/02/20 2137 12/03/20 0808 12/03/20 1203  GLUCAP 198* 128* 101* 72 171*     Microbiology: Results for orders placed or performed during the hospital encounter of 11/19/20  Blood  culture (routine x 2)     Status: None   Collection Time: 11/19/20  4:47 PM   Specimen: BLOOD  Result Value Ref Range Status   Specimen Description BLOOD RIGHT ANTECUBITAL  Final   Special Requests   Final    BOTTLES DRAWN AEROBIC AND ANAEROBIC Blood Culture adequate volume   Culture   Final    NO GROWTH 5 DAYS Performed at [facility], [address] [place], [place] [address]    Report Status 11/24/2020 FINAL  Final  Resp Panel by RT-PCR (Flu A&B, Covid) Nasopharyngeal Swab     Status: None   Collection Time: 11/19/20  4:48 PM   Specimen: Nasopharyngeal Swab; Nasopharyngeal(NP) swabs in vial transport medium  Result Value Ref Range Status   SARS Coronavirus 2 by RT  PCR NEGATIVE NEGATIVE Final    Comment: (NOTE) SARS-CoV-2 target nucleic acids are NOT DETECTED.  The SARS-CoV-2 RNA is generally detectable in upper respiratory specimens during the acute phase of infection. The lowest concentration of SARS-CoV-2 viral copies this assay can detect is 138 copies/mL. A negative result does not preclude SARS-Cov-2 infection and should not be used as the sole basis for treatment or other patient management decisions. A negative result may occur with  improper specimen collection/handling, submission of specimen other than nasopharyngeal swab, presence of viral mutation(s) within the areas targeted by this assay, and inadequate number of viral copies(<138 copies/mL). A negative result must be combined with clinical observations, patient history, and epidemiological information. The expected result is Negative.  Fact Sheet for Patients:  EntrepreneurPulse.com.au  Fact Sheet for Healthcare Providers:  IncredibleEmployment.be  This test is no t yet approved or cleared by the [place] FDA and  has been authorized for detection and/or diagnosis of SARS-CoV-2 by FDA under an Emergency Use Authorization (EUA). This EUA will remain  in effect (meaning this test can be used) for the duration of the COVID-19 declaration under Section 564(b)(1) of the Act, 21 U.S.C.section 360bbb-3(b)(1), unless the authorization is terminated  or revoked sooner.       Influenza A by PCR NEGATIVE NEGATIVE Final   Influenza B by PCR NEGATIVE NEGATIVE Final    Comment: (NOTE) The Xpert Xpress SARS-CoV-2/FLU/RSV plus assay is intended as an aid in the diagnosis of influenza from Nasopharyngeal swab specimens and should not be used as a sole basis for treatment. Nasal washings and aspirates are unacceptable for Xpert Xpress SARS-CoV-2/FLU/RSV testing.  Fact Sheet for Patients: EntrepreneurPulse.com.au  Fact Sheet for  Healthcare Providers: IncredibleEmployment.be  This test is not yet approved or cleared by the [place] FDA and has been authorized for detection and/or diagnosis of SARS-CoV-2 by FDA under an Emergency Use Authorization (EUA). This EUA will remain in effect (meaning this test can be used) for the duration of the COVID-19 declaration under Section 564(b)(1) of the Act, 21 U.S.C. section 360bbb-3(b)(1), unless the authorization is terminated or revoked.  Performed at [facility], [address] [place], [place] [address]   Blood culture (routine x 2)     Status: None   Collection Time: 11/19/20  6:46 PM   Specimen: BLOOD  Result Value Ref Range Status   Specimen Description BLOOD RIGHT ANTECUBITAL  Final   Special Requests   Final    BOTTLES DRAWN AEROBIC AND ANAEROBIC Blood Culture adequate volume   Culture   Final    NO GROWTH 5 DAYS Performed at [facility], [address] [place], [place] [address]  Report Status 11/24/2020 FINAL  Final  Urine Culture     Status: Abnormal   Collection Time: 11/19/20  9:33 PM   Specimen: Urine, Clean Catch  Result Value Ref Range Status   Specimen Description   Final    URINE, CLEAN CATCH Performed at [facility], [address] [place], [place] [address]    Special Requests   Final    NONE Performed at [facility], [address], [place], [place] [address]    Culture (A)  Final    <10,000 COLONIES/mL INSIGNIFICANT GROWTH Performed at [facility] [address], [place], [place] [address]    Report Status 11/21/2020 FINAL  Final  Culture, fungus without smear     Status: None (Preliminary result)   Collection Time: 11/20/20 11:09 AM   Specimen: CSF; Cerebrospinal Fluid  Result Value Ref Range Status   Specimen Description   Final    CSF Performed at [facility], [address], [place], [place] [address]    Special Requests   Final     NONE Performed at [facility], [address], [name], [place] [address]    Culture   Final    NO FUNGUS ISOLATED AFER 12 DAYS Performed at [facility], [place] [address] [place], [place] [address]    Report Status PENDING  Incomplete  CSF culture w Gram Stain     Status: None   Collection Time: 11/20/20 11:09 AM   Specimen: PATH Cytology CSF; Cerebrospinal Fluid  Result Value Ref Range Status   Specimen Description   Final    CSF Performed at [facility], [address], [place], [place] [address]    Special Requests   Final    NONE Performed at [facility], [address] [place], [place] [address]    Gram Stain   Final    NO ORGANISMS SEEN WBC SEEN RED BLOOD CELLS PRESENT Performed at [facility], [address] [place], [place] [address]    Culture   Final    NO GROWTH 3 DAYS Performed at [facility], [place] [address], [place],  [address]    Report Status 11/23/2020 FINAL  Final  Fungus Culture With Stain     Status: None (Preliminary result)   Collection Time: 11/20/20 11:09 AM   Specimen: PATH Cytology CSF; Cerebrospinal Fluid  Result Value Ref Range Status   Fungus Stain Final report  Final    Comment: (NOTE) Performed At: [facility] [place], [place] [id number] Rush Farmer MD [id number]    Fungus (Mycology) Culture PENDING  Incomplete   Fungal Source CSF  Final    Comment: Performed at [facility], [address] [place],  [address]  Fungus Culture Result     Status: None   Collection Time: 11/20/20 11:09 AM  Result Value Ref Range Status   Result 1 Comment  Final    Comment: (NOTE) KOH/Calcofluor preparation:  no fungus observed. Performed At: [facility] [place], [place] [id number] Rush Farmer MD HQ:[id number]   Resp Panel by RT-PCR (Flu A&B, Covid) Nasopharyngeal Swab     Status: None   Collection Time: 11/27/20   1:39 PM   Specimen: Nasopharyngeal Swab; Nasopharyngeal(NP) swabs in vial transport medium  Result Value Ref Range Status   SARS Coronavirus 2 by RT PCR NEGATIVE NEGATIVE Final    Comment: (NOTE) SARS-CoV-2 target nucleic acids are NOT  DETECTED.  The SARS-CoV-2 RNA is generally detectable in upper respiratory specimens during the acute phase of infection. The lowest concentration of SARS-CoV-2 viral copies this assay can detect is 138 copies/mL. A negative result does not preclude SARS-Cov-2 infection and should not be used as the sole basis for treatment or other patient management decisions. A negative result may occur with  improper specimen collection/handling, submission of specimen other than nasopharyngeal swab, presence of viral mutation(s) within the areas targeted by this assay, and inadequate number of viral copies(<138 copies/mL). A negative result must be combined with clinical observations, patient history, and epidemiological information. The expected result is Negative.  Fact Sheet for Patients:  EntrepreneurPulse.com.au  Fact Sheet for Healthcare Providers:  IncredibleEmployment.be  This test is no t yet approved or cleared by the [place] FDA and  has been authorized for detection and/or diagnosis of SARS-CoV-2 by FDA under an Emergency Use Authorization (EUA). This EUA will remain  in effect (meaning this test can be used) for the duration of the COVID-19 declaration under Section 564(b)(1) of the Act, 21 U.S.C.section 360bbb-3(b)(1), unless the authorization is terminated  or revoked sooner.       Influenza A by PCR NEGATIVE NEGATIVE Final   Influenza B by PCR NEGATIVE NEGATIVE Final    Comment: (NOTE) The Xpert Xpress SARS-CoV-2/FLU/RSV plus assay is intended as an aid in the diagnosis of influenza from Nasopharyngeal swab specimens and should not be used as a sole basis for treatment. Nasal washings and aspirates  are unacceptable for Xpert Xpress SARS-CoV-2/FLU/RSV testing.  Fact Sheet for Patients: EntrepreneurPulse.com.au  Fact Sheet for Healthcare Providers: IncredibleEmployment.be  This test is not yet approved or cleared by the [place] FDA and has been authorized for detection and/or diagnosis of SARS-CoV-2 by FDA under an Emergency Use Authorization (EUA). This EUA will remain in effect (meaning this test can be used) for the duration of the COVID-19 declaration under Section 564(b)(1) of the Act, 21 U.S.C. section 360bbb-3(b)(1), unless the authorization is terminated or revoked.  Performed at [facility], [address] [place], [place] [address]   Urine Culture     Status: Abnormal   Collection Time: 11/27/20  2:15 PM   Specimen: Urine, Clean Catch  Result Value Ref Range Status   Specimen Description   Final    URINE, CLEAN CATCH Performed at [facility], [address] [place], [place] [address]    Special Requests   Final    NONE Performed at [facility], [place], [place] [address]    Culture 20,000 COLONIES/mL ENTEROBACTER AEROGENES (A)  Final   Report Status 11/30/2020 FINAL  Final   Organism ID, Bacteria ENTEROBACTER AEROGENES (A)  Final      Susceptibility   Enterobacter aerogenes - MIC*    CEFAZOLIN >=64 RESISTANT Resistant     CEFEPIME 1 SENSITIVE Sensitive     CEFTRIAXONE >=64 RESISTANT Resistant     CIPROFLOXACIN <=0.25 SENSITIVE Sensitive     GENTAMICIN <=1 SENSITIVE Sensitive     IMIPENEM <=0.25 SENSITIVE Sensitive     NITROFURANTOIN 64 INTERMEDIATE Intermediate     TRIMETH/SULFA <=20 SENSITIVE Sensitive     PIP/TAZO >=128 RESISTANT Resistant     * 20,000 COLONIES/mL ENTEROBACTER AEROGENES  CULTURE, BLOOD (ROUTINE X 2) w Reflex to ID Panel     Status: None   Collection Time: 11/27/20  8:23 PM   Specimen: BLOOD  Result Value Ref Range Status   Specimen Description  BLOOD LAC  Final   Special Requests   Final    BOTTLES DRAWN AEROBIC AND ANAEROBIC Blood Culture results may not be optimal due to an inadequate volume of blood received in culture bottles   Culture   Final    NO GROWTH 5 DAYS Performed at [facility], [address] [place], [place] [address]    Report Status 12/02/2020 FINAL  Final  CULTURE, BLOOD (ROUTINE X 2) w Reflex to ID Panel     Status: None   Collection Time: 11/27/20  9:32 PM   Specimen: BLOOD LEFT HAND  Result Value Ref Range Status   Specimen Description BLOOD LEFT HAND  Final   Special Requests   Final    BOTTLES DRAWN AEROBIC AND ANAEROBIC Blood Culture adequate volume   Culture   Final    NO GROWTH 5 DAYS Performed at [facility], [address], [place], [place] [address]    Report Status 12/02/2020 FINAL  Final    Coagulation Studies: No results for input(s): LABPROT, INR in the last 72 hours.  Urinalysis: Recent Labs    12/02/20 0600  COLORURINE YELLOW*  LABSPEC 1.009  PHURINE 7.0  GLUCOSEU NEGATIVE  HGBUR LARGE*  BILIRUBINUR NEGATIVE  KETONESUR NEGATIVE  PROTEINUR 100*  NITRITE NEGATIVE  LEUKOCYTESUR NEGATIVE       Imaging: US RENAL  Result Date: 12/01/2020 CLINICAL DATA:  Acute kidney injury, history CLL, diabetes mellitus, hypertension EXAM: RENAL / URINARY TRACT ULTRASOUND COMPLETE COMPARISON:  CT abdomen and pelvis 11/21/2020 FINDINGS: Right Kidney: Renal measurements: 11.4 x 5.8 x 6.0 cm = volume: 206 mL. Normal cortical thickness. Increased cortical echogenicity. No mass, hydronephrosis, or shadowing calcification. Left Kidney: Renal measurements: 13.1 x 7.3 x 6.1 cm = volume: 3 L4 mL. Normal cortical thickness. Increased cortical echogenicity. Tiny cyst at mid kidney 9 x 13 x 13 mm, simple features. No additional mass, hydronephrosis, or shadowing calcification. Bladder: Partially distended. Cystic intraluminal focus at the posterior wall RIGHT of midline question RIGHT  ureterocele approximately 16 x 6 x 9 mm in size. Other: N/A IMPRESSION: Medical renal disease changes of both kidneys. Intraluminal cystic focus posterior RIGHT bladder, favor representing a small RIGHT ureterocele. Electronically Signed   By: Lavonia Dana M.D.   On: 12/01/2020 16:26     Medications:    doxycycline (VIBRAMYCIN) IV 100 mg (12/03/20 1013)    sodium chloride   Intravenous Once   Chlorhexidine Gluconate Cloth  6 each Topical Daily   diltiazem  120 mg Oral Daily   feeding supplement  1 Container Oral TID BM   hydrALAZINE  50 mg Oral Q8H   insulin aspart  0-15 Units Subcutaneous TID WC   insulin glargine-yfgn  10 Units Subcutaneous Daily   levothyroxine  100 mcg Oral QAC breakfast   magnesium oxide  800 mg Oral BID   mouth rinse  15 mL Mouth Rinse q12n4p   mirtazapine  15 mg Oral QHS   multivitamin with minerals  1 tablet Oral Daily   pantoprazole  40 mg Oral Daily   rosuvastatin  10 mg Oral QHS   sodium chloride flush  10-40 mL Intracatheter Q12H   thiamine  100 mg Oral Daily   acetaminophen **OR** acetaminophen, albuterol, haloperidol lactate, ondansetron **OR** ondansetron (ZOFRAN) IV, polyethylene glycol, sodium chloride flush  Assessment/ Plan:  [name] is a 55 y.o.  female with medical conditions including pneumococcal meningitis bacteremia, hypothyroidism, Type 2 diabetes, and lymphoma in remission. She presents  to the ED with confusion and is admitted for Altered mental status [R41.82] Encephalopathy acute [G93.40] Altered mental status, unspecified altered mental status type [R41.82] Sepsis with encephalopathy without septic shock, due to unspecified organism ([place]) [A41.9, R65.20, G93.40]   Acute Kidney Injury with baseline creatinine 0.49 on 11/22/20.  Acute kidney injury secondary to contrast induced ATN. IV contrast exposure on November 21, 2020.  UA shows protienuria, >50 RBCs, 21-50 WBC, microbiology 20k colonies- Enterobacter. Renal ultrasound  negative for mass and obstruction. Questionable small urterocele. No acute need for dialysis at this time Creatinine continues to increase, UOP 1.1L IVF given yesterday without desired effect.  Will re-order UA and protein-creatinine ratio.  Due to lack of renal recovery with prescribed therapies, Will order testing to further evalute: ANCA, ANA, C3, C4, Total complement, Kappa light, and SPEP. Strict intake and output Continue to avoid hypotension, ACE/ARBs and any nephrotoxic agents   Lab Results  Component Value Date   CREATININE 2.41 (H) 12/03/2020   CREATININE 2.06 (H) 12/02/2020   CREATININE 1.86 (H) 12/01/2020    Intake/Output Summary (Last 24 hours) at 12/03/2020 1335 Last data filed at 12/03/2020 0806 Gross per 24 hour  Intake --  Output 400 ml  Net -400 ml    2. Anemia  with history of CLL pancytopenia Lab Results  Component Value Date   HGB 6.5 (L) 12/03/2020   Order to transfuse 1 unit RBC     LOS: [place] 8/10/20221:35 PM

## 2020-12-03 NOTE — Progress Notes (Signed)
OT Cancellation Note  Patient Details Name: Tonya Myers MRN: [id number] DOB: 1965/12/25   Cancelled Treatment:    Reason Eval/Treat Not Completed: Other (comment) Upon chart review, noted that pt with low Hgb. In addition, she is currently declining therapy services citing fatigue. Will f/u next available date/time for OT treatment. Thank you.  Gerrianne Scale, [place], OTR/L ascom [phone] 12/03/20, 2:19 PM

## 2020-12-04 ENCOUNTER — Inpatient Hospital Stay: Payer: Medicare Other

## 2020-12-04 ENCOUNTER — Inpatient Hospital Stay (HOSPITAL_COMMUNITY): Payer: Medicare Other

## 2020-12-04 DIAGNOSIS — R509 Fever, unspecified: Secondary | ICD-10-CM | POA: Diagnosis not present

## 2020-12-04 DIAGNOSIS — R410 Disorientation, unspecified: Secondary | ICD-10-CM | POA: Diagnosis not present

## 2020-12-04 LAB — CBC WITH DIFFERENTIAL/PLATELET
Abs Immature Granulocytes: 0.02 10*3/uL (ref 0.00–0.07)
Basophils Absolute: 0 10*3/uL (ref 0.0–0.1)
Basophils Relative: 1 %
Eosinophils Absolute: 0.1 10*3/uL (ref 0.0–0.5)
Eosinophils Relative: 3 %
HCT: 23.4 % — ABNORMAL LOW (ref 36.0–46.0)
Hemoglobin: 7.8 g/dL — ABNORMAL LOW (ref 12.0–15.0)
Immature Granulocytes: 1 %
Lymphocytes Relative: 18 %
Lymphs Abs: 0.7 10*3/uL (ref 0.7–4.0)
MCH: 27.3 pg (ref 26.0–34.0)
MCHC: 33.3 g/dL (ref 30.0–36.0)
MCV: 81.8 fL (ref 80.0–100.0)
Monocytes Absolute: 0.1 10*3/uL (ref 0.1–1.0)
Monocytes Relative: 4 %
Neutro Abs: 2.7 10*3/uL (ref 1.7–7.7)
Neutrophils Relative %: 73 %
Platelets: 47 10*3/uL — ABNORMAL LOW (ref 150–400)
RBC: 2.86 MIL/uL — ABNORMAL LOW (ref 3.87–5.11)
RDW: 17 % — ABNORMAL HIGH (ref 11.5–15.5)
Smear Review: NORMAL
WBC: 3.6 10*3/uL — ABNORMAL LOW (ref 4.0–10.5)
nRBC: 0 % (ref 0.0–0.2)

## 2020-12-04 LAB — GLUCOSE, CAPILLARY
Glucose-Capillary: 67 mg/dL — ABNORMAL LOW (ref 70–99)
Glucose-Capillary: 100 mg/dL — ABNORMAL HIGH (ref 70–99)
Glucose-Capillary: 78 mg/dL (ref 70–99)
Glucose-Capillary: 198 mg/dL — ABNORMAL HIGH (ref 70–99)
Glucose-Capillary: 90 mg/dL (ref 70–99)

## 2020-12-04 LAB — MAGNESIUM: Magnesium: 1.7 mg/dL (ref 1.7–2.4)

## 2020-12-04 LAB — BASIC METABOLIC PANEL
Anion gap: 7 (ref 5–15)
BUN: 19 mg/dL (ref 6–20)
CO2: 20 mmol/L — ABNORMAL LOW (ref 22–32)
Calcium: 7.7 mg/dL — ABNORMAL LOW (ref 8.9–10.3)
Chloride: 109 mmol/L (ref 98–111)
Creatinine, Ser: 2.66 mg/dL — ABNORMAL HIGH (ref 0.44–1.00)
GFR, Estimated: 20 mL/min — ABNORMAL LOW (ref >60)
Glucose, Bld: 79 mg/dL (ref 70–99)
Potassium: 3.7 mmol/L (ref 3.5–5.1)
Sodium: 136 mmol/L (ref 135–145)

## 2020-12-04 LAB — LACTATE DEHYDROGENASE: LDH: 270 U/L — ABNORMAL HIGH (ref 98–192)

## 2020-12-04 LAB — C3 COMPLEMENT: C3 Complement: 22 mg/dL — ABNORMAL LOW (ref 82–167)

## 2020-12-04 LAB — FERRITIN: Ferritin: 807 ng/mL — ABNORMAL HIGH (ref 11–307)

## 2020-12-04 LAB — KAPPA/LAMBDA LIGHT CHAINS
Kappa free light chain: 334.9 mg/L — ABNORMAL HIGH (ref 3.3–19.4)
Kappa, lambda light chain ratio: 2.02 — ABNORMAL HIGH (ref 0.26–1.65)
Lambda free light chains: 165.6 mg/L — ABNORMAL HIGH (ref 5.7–26.3)

## 2020-12-04 LAB — LIPASE, BLOOD: Lipase: 149 U/L — ABNORMAL HIGH (ref 11–51)

## 2020-12-04 LAB — C4 COMPLEMENT: Complement C4, Body Fluid: 4 mg/dL — ABNORMAL LOW (ref 12–38)

## 2020-12-04 LAB — SEDIMENTATION RATE: Sed Rate: 79 mm/hr — ABNORMAL HIGH (ref 0–30)

## 2020-12-04 LAB — PROCALCITONIN: Procalcitonin: 2.13 ng/mL

## 2020-12-04 IMAGING — US US EXTREM  UP VENOUS BILAT
2 series · 13 of 24 positions shown · non-contrast
Comparison: None.

CLINICAL DATA: Fever of unknown origin, diabetes, numbness

EXAM:
BILATERAL UPPER EXTREMITY VENOUS DOPPLER ULTRASOUND
TECHNIQUE: Gray-scale sonography with graded compression, as well as color
Doppler and duplex ultrasound were performed to evaluate the
bilateral upper extremity deep venous systems from the level of the
subclavian vein and including the jugular, axillary, basilic,
radial, ulnar and upper cephalic vein. Spectral Doppler was utilized
to evaluate flow at rest and with distal augmentation maneuvers.

[Series 1: us venous img upper bilat (dvt) · portal-venous · 6 of 29 slices shown]
[im 1/29]
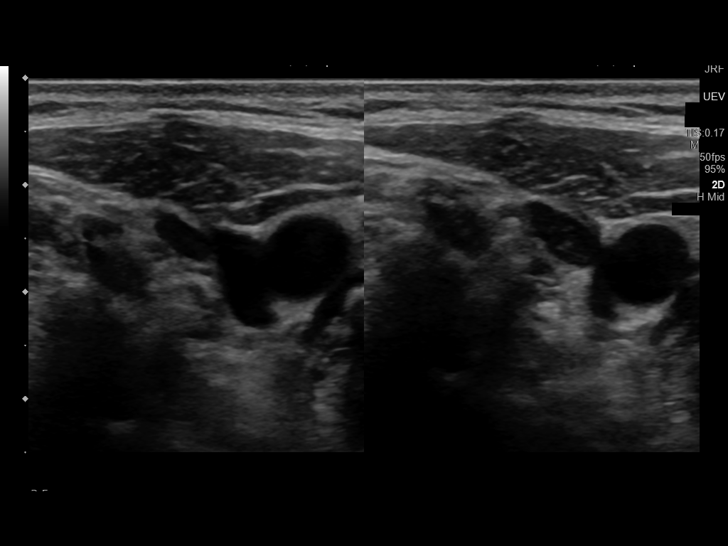
[im 6/29]
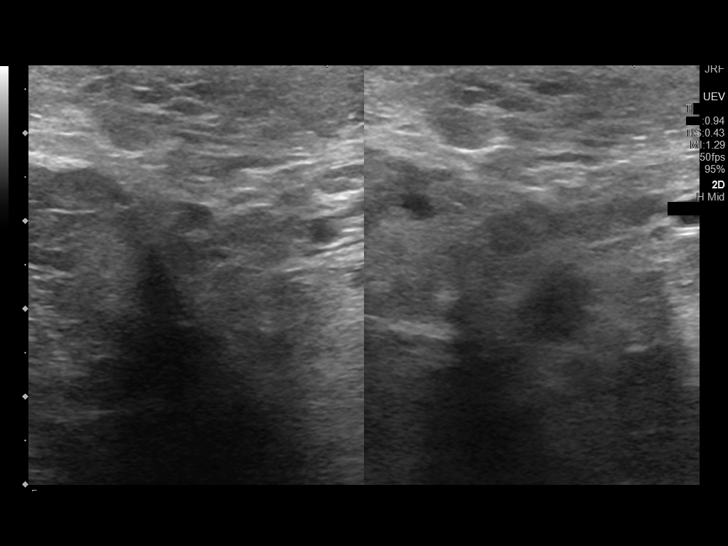
[im 11/29]
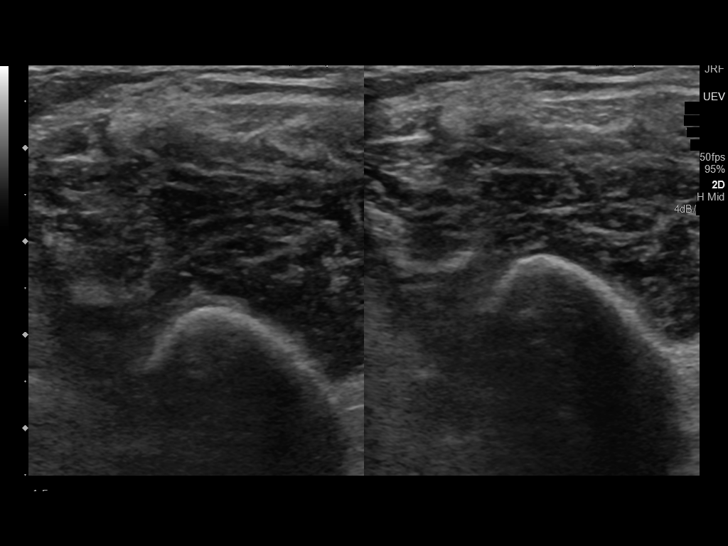
[im 16/29]
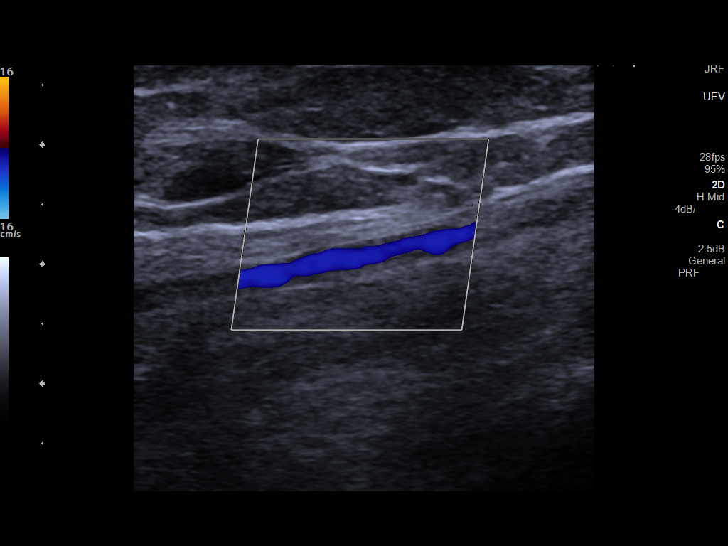
[im 21/29]
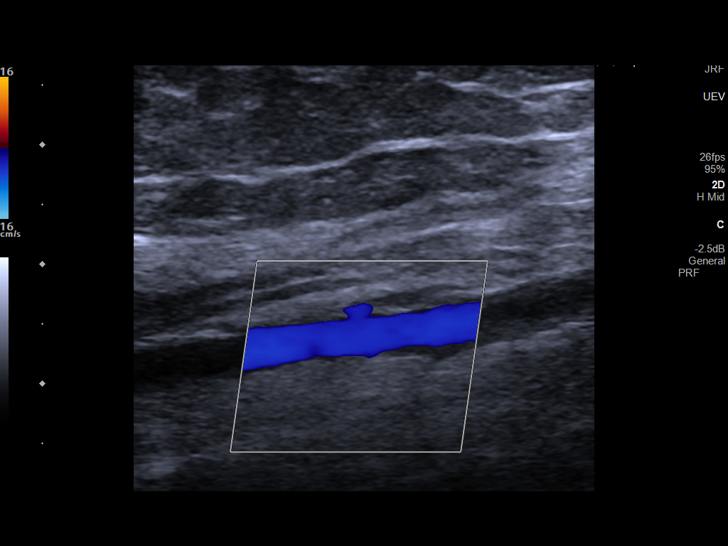
[im 26/29]
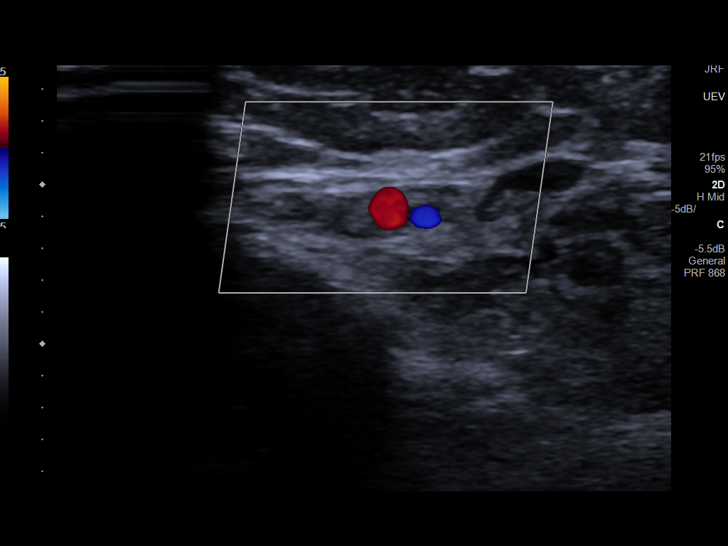

[Series 1001: uev · 7 of 28 slices shown]
[im 1/28]
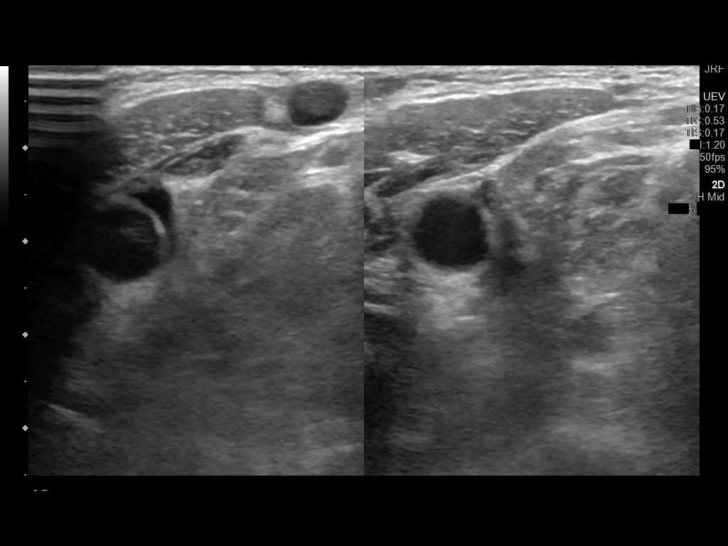
[im 3/28]
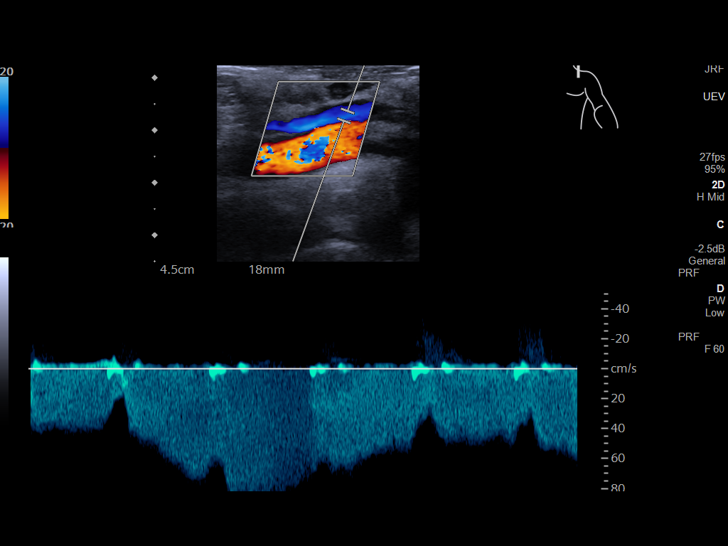
[im 8/28]
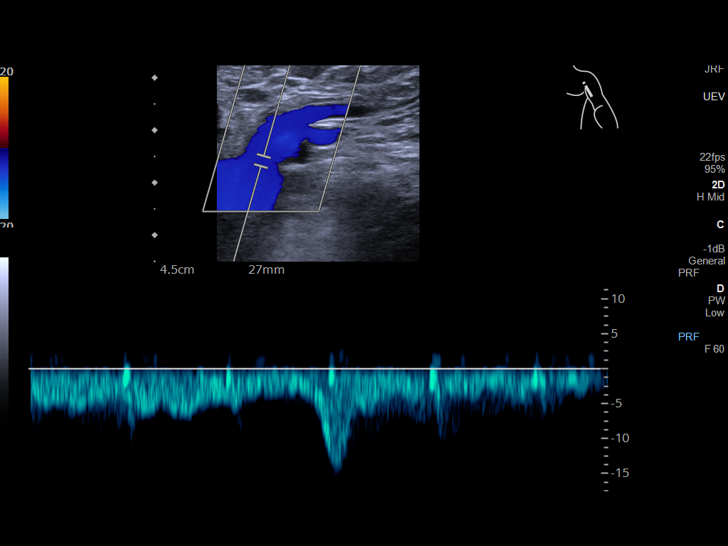
[im 13/28]
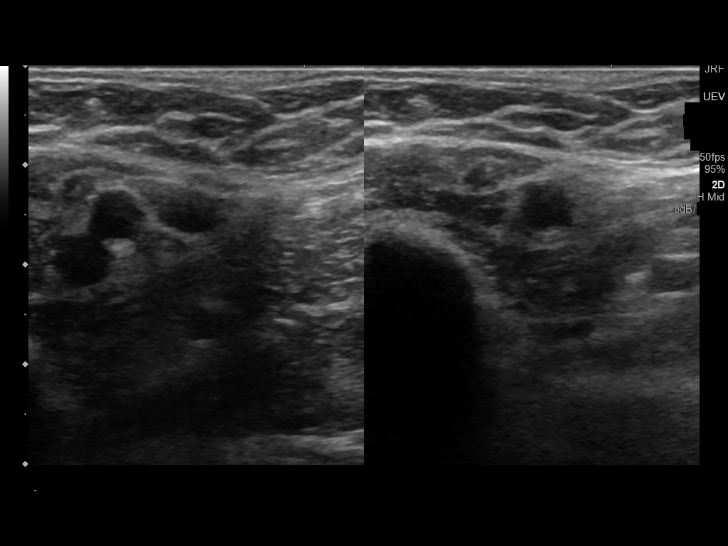
[im 18/28]
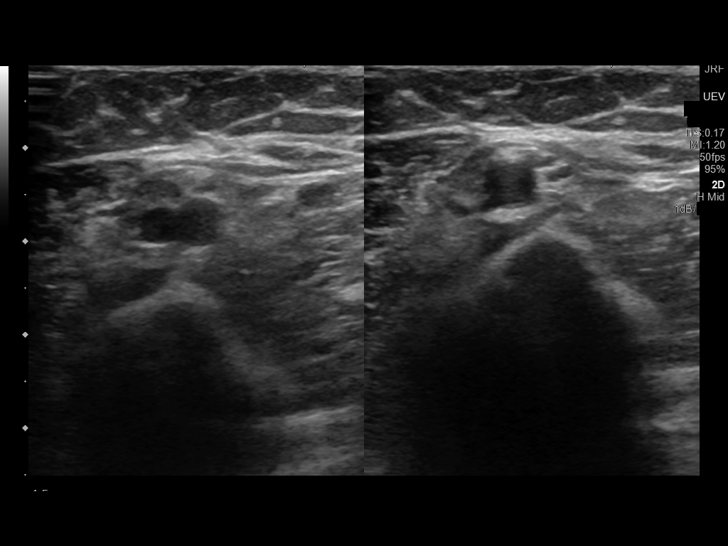
[im 23/28]
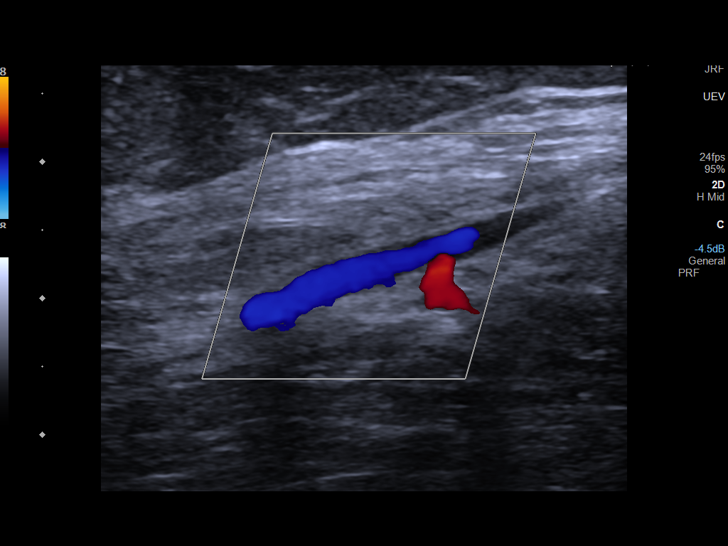
[im 28/28]
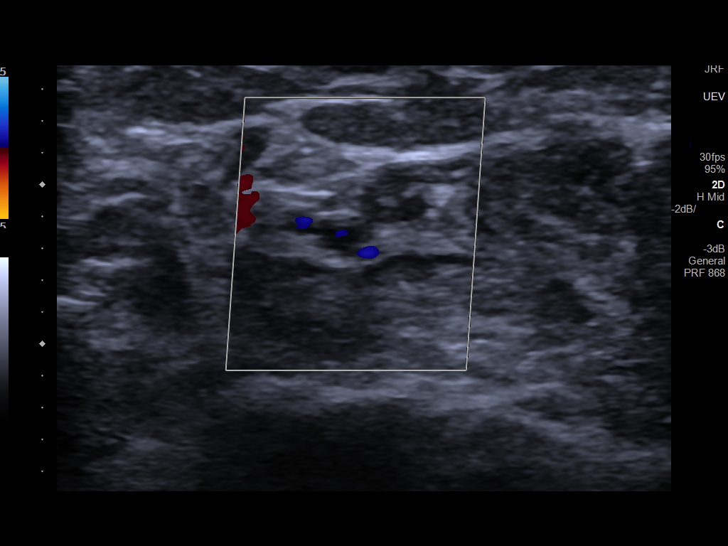

[13 of 24 positions shown; findings below may reference images not displayed]

FINDINGS: RIGHT UPPER EXTREMITY

Internal Jugular Vein: No evidence of thrombus. Normal
compressibility, respiratory phasicity and response to augmentation.

Subclavian Vein: No evidence of thrombus. Normal compressibility,
respiratory phasicity and response to augmentation.

Axillary Vein: No evidence of thrombus. Normal compressibility,
respiratory phasicity and response to augmentation.

Cephalic Vein: Not visualized

Basilic Vein: No evidence of thrombus. Normal compressibility,
respiratory phasicity and response to augmentation.

Brachial Veins: No evidence of thrombus. Normal compressibility,
respiratory phasicity and response to augmentation.

Radial Veins: No evidence of thrombus. Normal compressibility,
respiratory phasicity and response to augmentation.

Ulnar Veins: No evidence of thrombus. Normal compressibility,
respiratory phasicity and response to augmentation.

Venous Reflux:  None.

Other Findings:  None.

LEFT UPPER EXTREMITY

Internal Jugular Vein: No evidence of thrombus. Normal
compressibility, respiratory phasicity and response to augmentation.

Subclavian Vein: No evidence of thrombus. Normal compressibility,
respiratory phasicity and response to augmentation.

Axillary Vein: No evidence of thrombus. Normal compressibility,
respiratory phasicity and response to augmentation.

Cephalic Vein: Not visualized

Basilic Vein: No evidence of thrombus. Normal compressibility,
respiratory phasicity and response to augmentation.

Brachial Veins: No evidence of thrombus. Normal compressibility,
respiratory phasicity and response to augmentation.

Radial Veins: No evidence of thrombus. Normal compressibility,
respiratory phasicity and response to augmentation.

Ulnar Veins: No evidence of thrombus. Normal compressibility,
respiratory phasicity and response to augmentation.

Venous Reflux:  None.

Other Findings:  None.
IMPRESSION: No evidence of DVT within either upper extremity.

## 2020-12-04 IMAGING — DX DG CHEST 1V
1 series · 1 of 1 positions shown · non-contrast
Comparison: [DATE]

CLINICAL DATA: Cough

EXAM:
CHEST 1 VIEW

[chest ap]
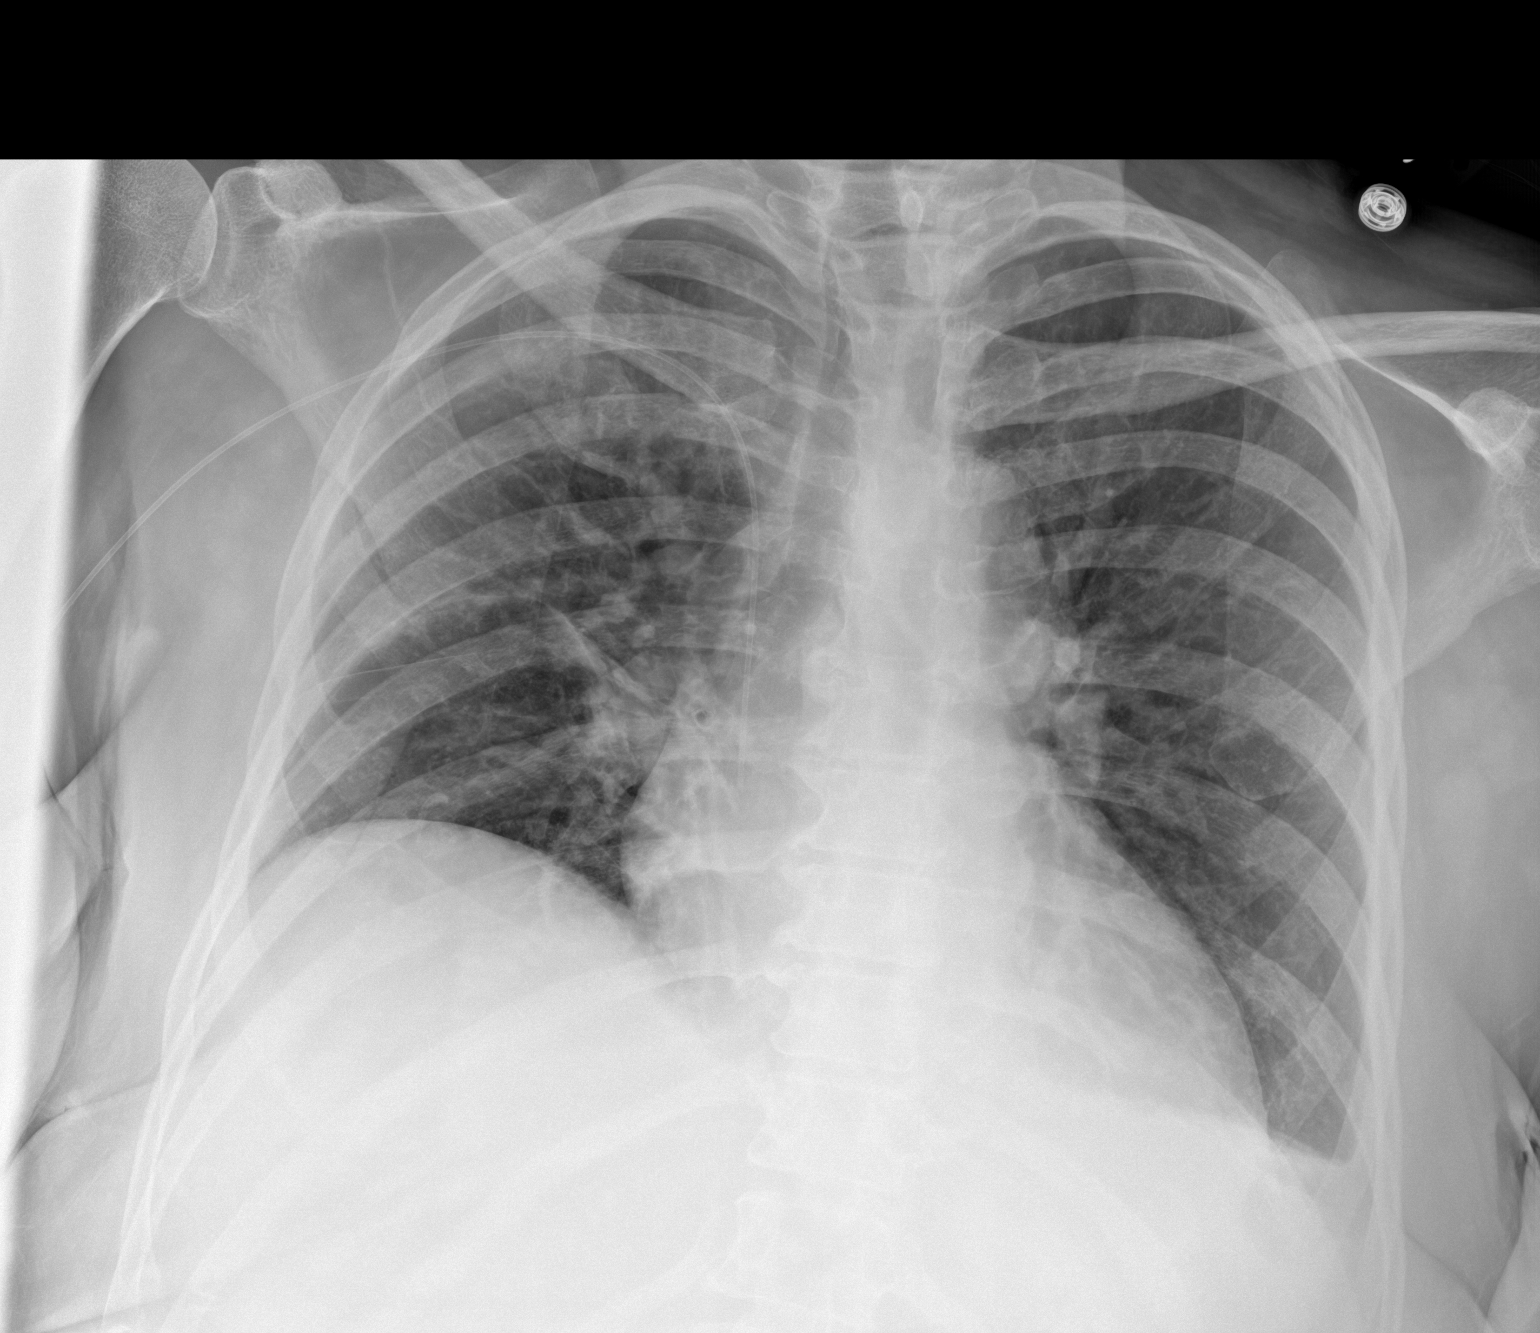

[1 of 1 positions shown; findings below may reference images not displayed]

FINDINGS: Few faint residual patchy opacities are present in the right mid
lung. There is persistent basilar atelectasis as well. Asymmetric
elevation of the right hemidiaphragm similar to prior. New left
effusion. No right effusion. No pneumothorax. Stable
cardiomediastinal contours. Right upper extremity PICC tip
terminates at the superior cavoatrial junction. No acute osseous or
soft tissue abnormality of the chest wall
IMPRESSION: Few faint residual patchy opacities present in the right mid lung,
possibly infectious or inflammatory as seen on CT and prior
radiograph.

New trace left effusion.

Right upper extremity PICC terminates at the superior cavoatrial
junction.

## 2020-12-04 IMAGING — US US EXTREM LOW VENOUS
1 series · 13 of 24 positions shown · non-contrast
Comparison: None.

CLINICAL DATA: Fever unknown origin

EXAM:
BILATERAL LOWER EXTREMITY VENOUS DOPPLER ULTRASOUND
TECHNIQUE: Gray-scale sonography with compression, as well as color and duplex
ultrasound, were performed to evaluate the deep venous system(s)
from the level of the common femoral vein through the popliteal and
proximal calf veins.

[Series 1: us venous img lower bilat (dvt) · portal-venous · 13 of 60 slices shown]
[im 1/60]
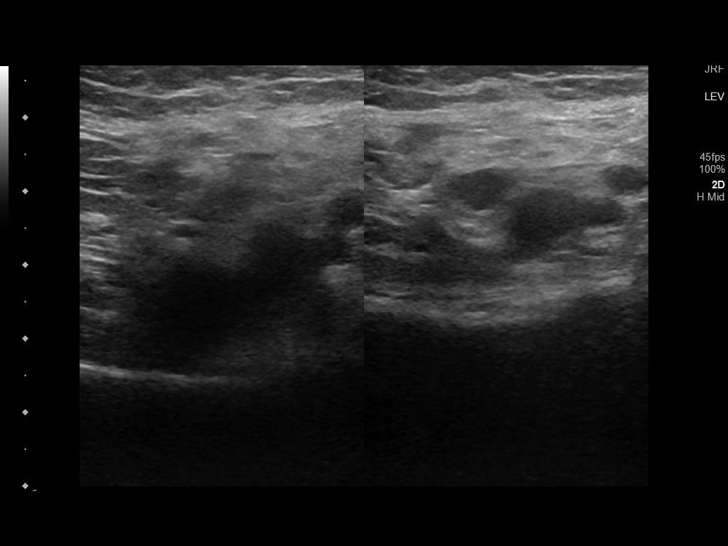
[im 6/60]
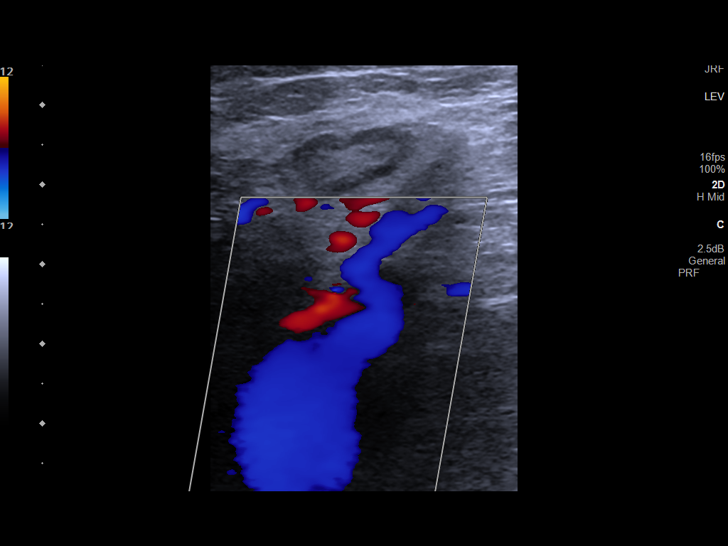
[im 11/60]
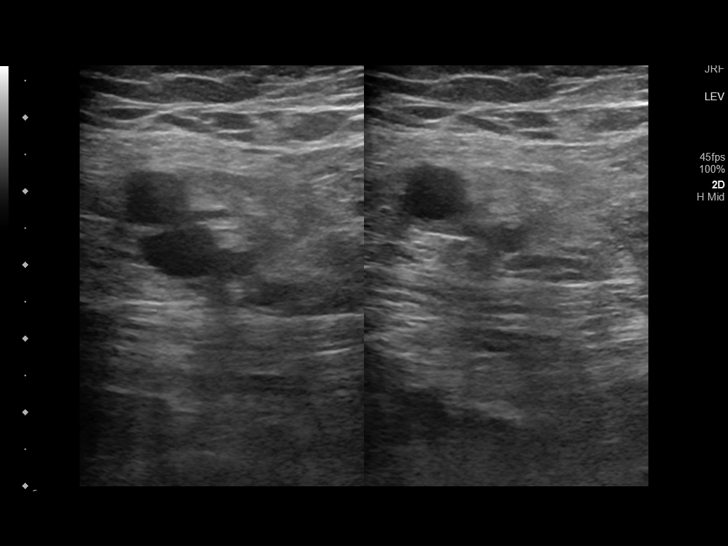
[im 16/60]
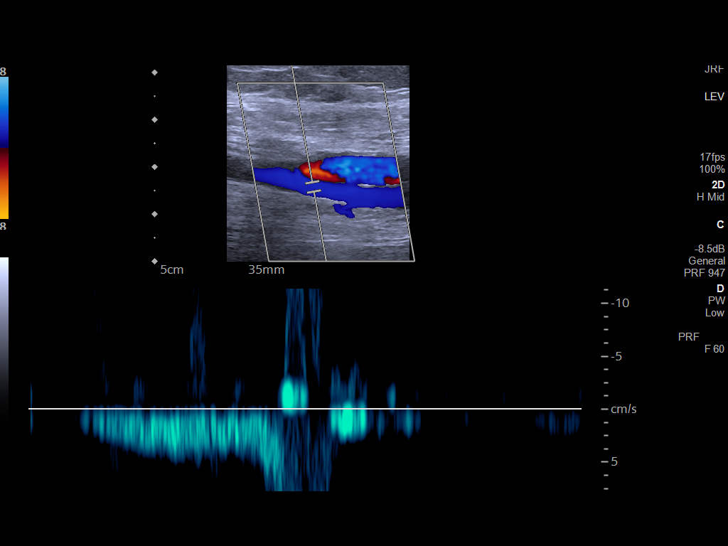
[im 21/60]
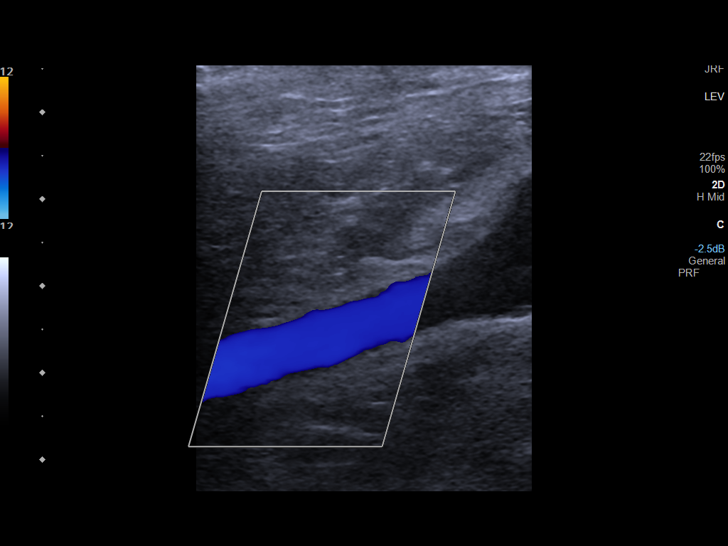
[im 26/60]
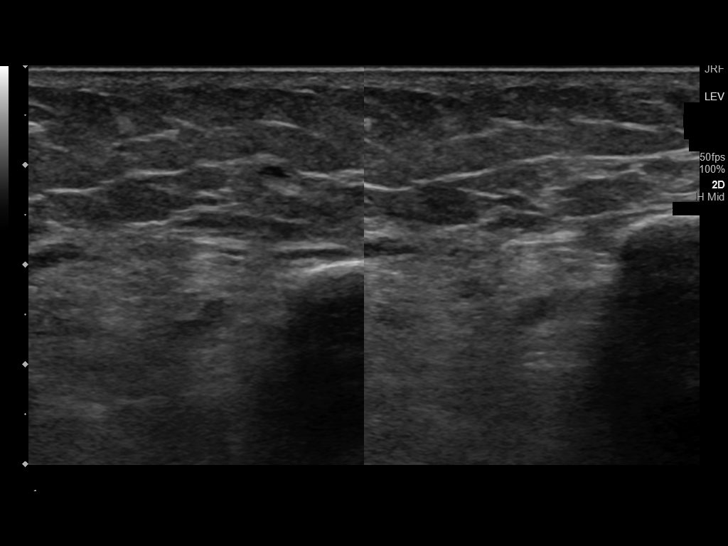
[im 31/60]
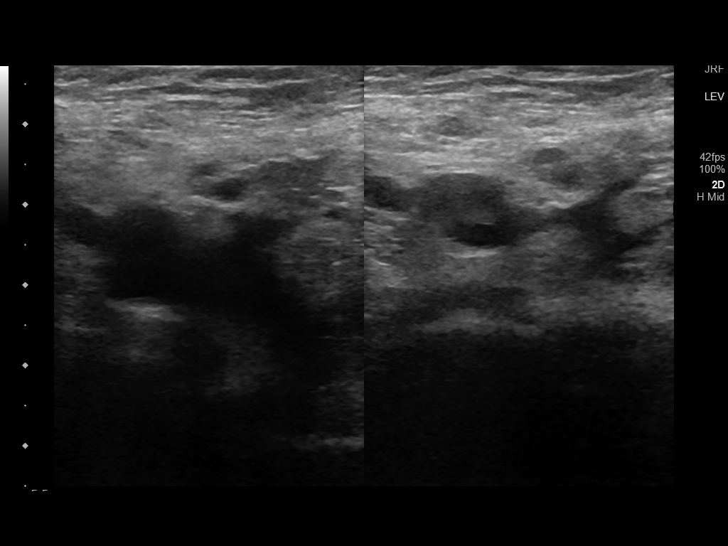
[im 34/60]
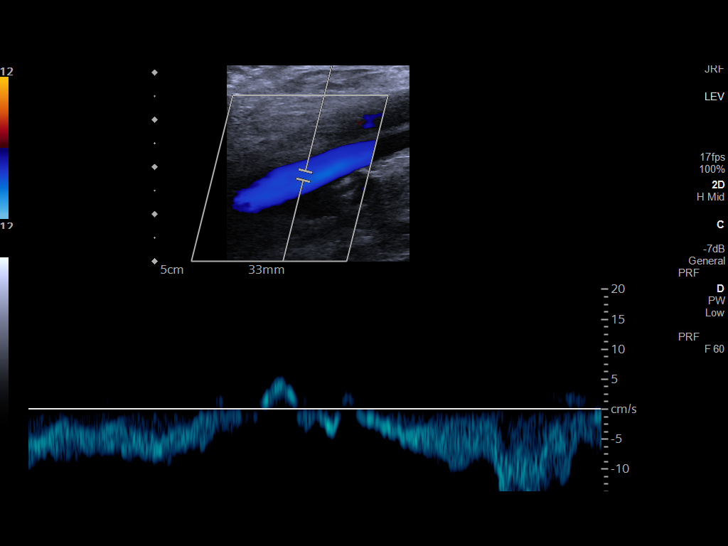
[im 39/60]
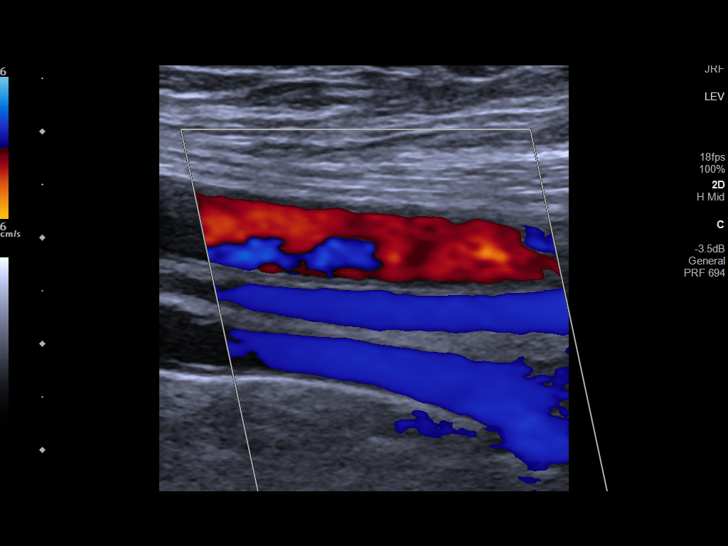
[im 44/60]
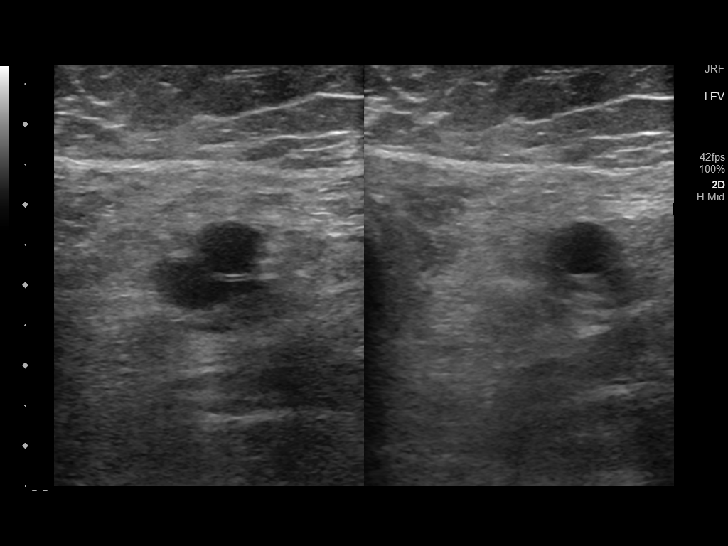
[im 49/60]
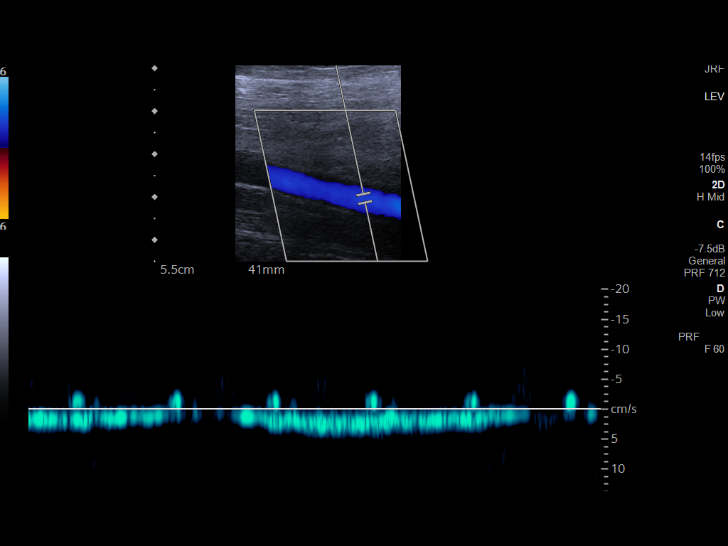
[im 54/60]
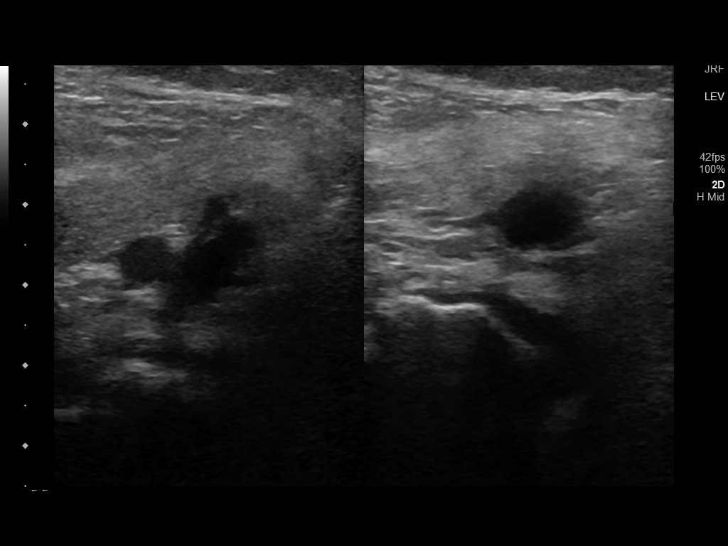
[im 60/60]
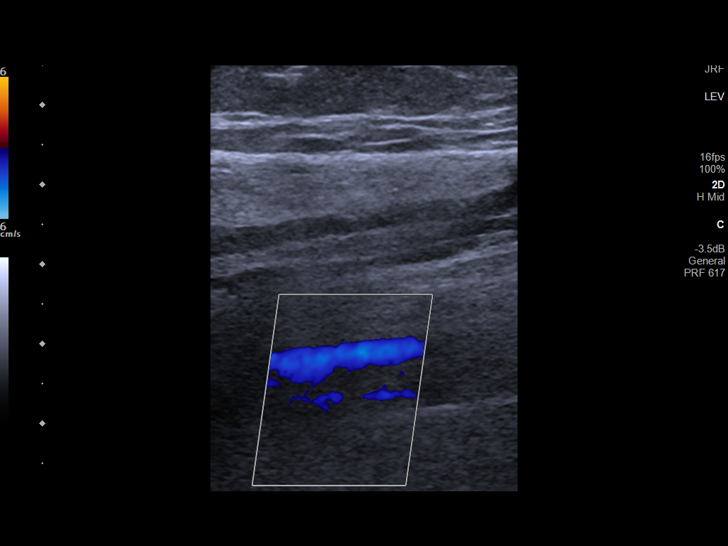

[13 of 24 positions shown; findings below may reference images not displayed]

FINDINGS: VENOUS

On the left, normal compressibility of the common femoral,
superficial femoral, and popliteal veins, as well as the visualized
calf veins. Visualized portions of profunda femoral vein and great
saphenous vein unremarkable. No filling defects to suggest DVT on
grayscale or color Doppler imaging. Doppler waveforms show normal
direction of venous flow, normal respiratory phasicity and response
to augmentation.

On the right, incomplete compressibility of the proximal popliteal
vein with eccentric mural thickening containing linear echogenic
regions. There is continued patency of the lumen with flow seen on
color Doppler. Common femoral, femoral, and visualized calf veins
are unremarkable.

OTHER

None.

Limitations: none
IMPRESSION: 1. Negative for acute DVT.
2. Chronic-appearing post thrombotic change in the proximal right
popliteal vein without occlusion.

## 2020-12-04 MED ORDER — DEXTROSE 50 % IV SOLN
12.5000 g | Freq: Once | INTRAVENOUS | Status: DC
  Filled 2020-12-04: qty 50

## 2020-12-04 MED ORDER — MIDAZOLAM HCL 2 MG/2ML IJ SOLN
INTRAMUSCULAR | Status: AC | PRN
  Administered 2020-12-04: 2 mg via INTRAVENOUS

## 2020-12-04 MED ORDER — LOPERAMIDE HCL 2 MG PO CAPS
2.0000 mg | ORAL_CAPSULE | ORAL | Status: DC | PRN
  Administered 2020-12-04 – 2020-12-05 (×2): 2 mg via ORAL
  Filled 2020-12-04 (×2): qty 1

## 2020-12-04 MED ORDER — INSULIN GLARGINE-YFGN 100 UNIT/ML ~~LOC~~ SOLN
8.0000 [IU] | Freq: Every day | SUBCUTANEOUS | Status: DC
  Administered 2020-12-04: 8 [IU] via SUBCUTANEOUS
  Filled 2020-12-04 (×2): qty 0.08

## 2020-12-04 MED ORDER — PSYLLIUM 95 % PO PACK
1.0000 | PACK | Freq: Every day | ORAL | Status: DC
  Administered 2020-12-09 – 2020-12-22 (×4): 1 via ORAL
  Filled 2020-12-04 (×19): qty 1

## 2020-12-04 MED ORDER — MIDAZOLAM HCL 5 MG/5ML IJ SOLN
INTRAMUSCULAR | Status: AC
  Filled 2020-12-04: qty 5

## 2020-12-04 MED ORDER — HEPARIN SOD (PORK) LOCK FLUSH 100 UNIT/ML IV SOLN
INTRAVENOUS | Status: AC
  Filled 2020-12-04: qty 5

## 2020-12-04 NOTE — Progress Notes (Signed)
Inpatient Diabetes Program Recommendations  AACE/ADA: New Consensus Statement on Inpatient Glycemic Control (2015)  Target Ranges:  Prepandial:   less than 140 mg/dL      Peak postprandial:   less than 180 mg/dL (1-2 hours)      Critically ill patients:  140 - 180 mg/dL   Lab Results  Component Value Date   GLUCAP 100 (H) 12/04/2020   HGBA1C 6.5 (H) 10/02/2020    Review of Glycemic Control Results for MAELANI, YARBRO (MRN [id number]) as of 12/04/2020 09:44  Ref. Range 12/03/2020 08:08 12/03/2020 12:03 12/03/2020 15:50 12/03/2020 20:42 12/04/2020 07:29 12/04/2020 07:52  Glucose-Capillary Latest Ref Range: 70 - 99 mg/dL 84 171 (H) 114 (H) 86 67 (L) 100 (H)   Diabetes history: DM 2 Outpatient Diabetes medications: Glargine-lixisenatide 100-33  50 units Daily, metformin 1000 mg daily at supper Current orders for Inpatient glycemic control:  Semglee 10 units Novolog 0-15 units tid  Boost tid between meals  Inpatient Diabetes Program Recommendations:    Hypoglycemia of 67 this am  -Reduce Semglee to 8 units  Thanks,  Tama Headings RN, MSN, BC-ADM Inpatient Diabetes Coordinator Team Pager [phone] (8a-5p)

## 2020-12-04 NOTE — Progress Notes (Signed)
PROGRESS NOTE    Tonya Myers  DTO:[id number] DOB: Nov 07, 1965 DOA: 11/19/2020 PCP: [name], MD   Brief Narrative:  55 y.o. female with medical history significant for recent pneumococcal meningitis and bacteremia, depression, hypothyroid, IDDM2, presents to the ED for chief concern of altered mentation.  Extended hospitalization.  Etiology of encephalopathy is unclear.  Encephalopathy has resolved and patient has baseline level of mentation however she began to spike intermittent fevers associated with progressive worsening of kidney function.  Nephrology and infectious disease both following.  Patient is on IV antibiotics and intravenous fluids.  Work-up has been unrevealing for infectious cause.  Plan for bone marrow biopsy 8/11   Assessment & Plan:   Principal Problem:   Subacute delirium Active Problems:   DDD (degenerative disc disease), cervical   Aphasia   Anemia of chronic disease   Acute focal neurological deficit   Encephalitis   Altered mental status   Encephalopathy acute  Fever, recurrent Hx of epidural and paraspinal abscess --daily fevers  --repeat covid neg.  No cough or dyspnea to suggest PNA.  Procal increased. --MRI brain, spine 8/5, no obvious infection --UA pos for 20,000 COLONIES/mL ENTEROBACTER AEROGENES.  Started on Cipro, per ID. Plan: Completed ciprofloxacin Remains on doxycycline per infectious disease recommendations Bone marrow biopsy today 8/11   Acute pancreatitis Unclear etiology No reported history of alcohol intake, no radiographic evidence of gallstones Possible drug-induced however unclear exactly which drug could have driven this Initial abdominal examination negative for tenderness Lipase trending down with IVF, but much slowly than usual. Triglycerides negative Plan: On intravenous fluids for AKI.  Pancreatitis symptoms essentially resolved   Acute toxic metabolic encephalopathy, resolved Unclear etiology --meningitis  ruled out by LP.  CT head neg for new stroke.  Hematology ruled out CLL as the culprit.  No seizure on EEG. --mental status dramatically improved with treatment of pancreatitis and IV thiamine repletion. 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  Urine appeared very dark.   --nephrology consulted --renal US showed medical renal disease Creatinine continues to worsen Plan: Nephrology following for acute kidney injury.  Currently no emergent indications for hemodialysis   hypokalemia --monitor and replete PRN   Hypomag --replete with IV mag   Mouth pain and Oral thrush, resolved Patient with clinical indications of thrush Associated with difficulty swallowing Seems to be improving Patient states poor p.o. intake is driven by appetite rather than pain --s/p Diflucan for 3 doses   Insulin-dependent diabetes mellitus Hypoglycemic episodes Patient is on long-acting formulation 50 units daily at home in addition to metformin --A1c 6.5 Plan: --cont glargine 8 u daily --SSI   Hyperlipidemia --cont statin   Hypothyroid --cont Synthroid   HTN --cont cardizem and hydralazine   DVT prophylaxis: TED hose Code Status: Full code Family Communication: Spouse [name] [phone] on 8/11 Disposition Plan:Status is: Inpatient  Remains inpatient appropriate because:Inpatient level of care appropriate due to severity of illness  Dispo: The patient is from: Home              Anticipated d/c is to: Home  Patient currently is not medically stable to d/c.   Difficult to place patient  No  Encephalopathy resolved.  Intermittent fevers and worsening AKI.  Not medically stable for discharge at this time.  Bone marrow biopsy today     Level of care: Med-Surg  Consultants:  Nephrology ID  Procedures: LP  Antimicrobials:  Ciprofloxacin   Subjective: Seen and examined.  Frustrated about continued hospital stay.  Mental status much improved.  Asks when she can leave  Objective: Vitals:   12/03/20 1958 12/03/20 2359 12/04/20 0419 12/04/20 0728  BP: 134/71 (!) 153/78 (!) 142/74 (!) 141/75  Pulse: 84 75 72 75  Resp: 18 18 18 18   Temp: 99.4 F (37.4 C) 99.4 F (37.4 C) 98.7 F (37.1 C) 99.8 F (37.7 C)  TempSrc:   Oral   SpO2: 98% 100% 98% 99%  Weight:      Height:        Intake/Output Summary (Last 24 hours) at 12/04/2020 0955 Last data filed at 12/04/2020 0424 Gross per 24 hour  Intake 1593.73 ml  Output 475 ml  Net 1118.73 ml   Filed Weights   11/20/20 0507 12/01/20 0920 12/01/20 1730  Weight: 83 kg 94.9 kg 94.4 kg    Examination:  General exam: No acute distress Respiratory system: Clear to auscultation. Respiratory effort normal. Cardiovascular system: S1-S2, regular rate and rhythm, no murmurs, no pedal edema  gastrointestinal system: Soft, nontender, nondistended, normal bowel sounds Central nervous system: Alert and oriented. No focal neurological deficits. Extremities: Symmetric 5 x 5 power. Skin: No rashes, lesions or ulcers Psychiatry: Judgement and insight appear normal. Mood & affect flattened.     Data Reviewed: I have personally reviewed following labs and imaging studies  CBC: Recent Labs  Lab 11/30/20 0503 12/01/20 0620 12/02/20 0601 12/03/20 0521 12/03/20 2118 12/04/20 0609  WBC 4.9 4.5 4.2 3.4*  --  3.6*  NEUTROABS  --   --  3.1 2.6  --  2.7  HGB 7.7* 7.5* 7.4* 6.5* 8.6* 7.8*  HCT 24.3* 22.3* 22.6* 19.7* 25.6* 23.4*  MCV 81.8 83.2 82.8 83.5  --  81.8  PLT 73* 65* 63* 52*  --  47*   Basic Metabolic  Panel: Recent Labs  Lab 11/28/20 0455 11/29/20 0419 11/30/20 0503 12/01/20 0620 12/02/20 0601 12/03/20 0521 12/04/20 0609  NA 136   < > 136 136 137 138 136  K 3.4*   < > 3.6 3.9 3.9 3.8 3.7  CL 108   < > 111 109 111 111 109  CO2 25   < > 23 20* 21* 22 20*  GLUCOSE 165*   < > 161* 148* 105* 88 79  BUN 12   < > 14 15 16 19 19   CREATININE 1.35*   < > 1.54* 1.86* 2.06* 2.41* 2.66*  CALCIUM 7.5*   < > 7.3* 7.5* 7.8* 7.6* 7.7*  MG 1.6*   < > 2.2 1.8 2.0 1.8 1.7  PHOS 3.2  --   --   --   --   --   --    < > = values in this interval not displayed.   GFR: Estimated Creatinine Clearance: 30.4 mL/min (A) (by C-G formula based on SCr of 2.66 mg/dL (H)). Liver Function Tests: Recent Labs  Lab 11/28/20 0455 11/30/20 0503 12/03/20 1140  AST 45* 58* 62*  ALT 6 7 12   ALKPHOS 71 74 75  BILITOT 0.4 0.5 0.6  PROT 5.3* 5.2* 5.0*  ALBUMIN 2.0* 1.9*  1.9*   Recent Labs  Lab 11/30/20 0503 12/01/20 0620 12/02/20 0601 12/03/20 0521 12/04/20 0609  LIPASE 306* 270* 227* 190* 149*   No results for input(s): AMMONIA in the last 168 hours. Coagulation Profile: No results for input(s): INR, PROTIME in the last 168 hours. Cardiac Enzymes: Recent Labs  Lab 12/03/20 1140  CKTOTAL 19*   BNP (last 3 results) No results for input(s): PROBNP in the last 8760 hours. HbA1C: No results for input(s): HGBA1C in the last 72 hours. CBG: Recent Labs  Lab 12/03/20 1203 12/03/20 1550 12/03/20 2042 12/04/20 0729 12/04/20 0752  GLUCAP 171* 114* 86 67* 100*   Lipid Profile: No results for input(s): CHOL, HDL, LDLCALC, TRIG, CHOLHDL, LDLDIRECT in the last 72 hours. Thyroid Function Tests: No results for input(s): TSH, T4TOTAL, FREET4, T3FREE, THYROIDAB in the last 72 hours. Anemia Panel: No results for input(s): VITAMINB12, FOLATE, FERRITIN, TIBC, IRON, RETICCTPCT in the last 72 hours. Sepsis Labs: Recent Labs  Lab 11/29/20 0419 12/02/20 0601 12/03/20 0521 12/04/20 0609  PROCALCITON 1.80  1.74 1.71 2.13    Recent Results (from the past 240 hour(s))  Resp Panel by RT-PCR (Flu A&B, Covid) Nasopharyngeal Swab     Status: None   Collection Time: 11/27/20  1:39 PM   Specimen: Nasopharyngeal Swab; Nasopharyngeal(NP) swabs in vial transport medium  Result Value Ref Range Status   SARS Coronavirus 2 by RT PCR NEGATIVE NEGATIVE Final    Comment: (NOTE) SARS-CoV-2 target nucleic acids are NOT DETECTED.  The SARS-CoV-2 RNA is generally detectable in upper respiratory specimens during the acute phase of infection. The lowest concentration of SARS-CoV-2 viral copies this assay can detect is 138 copies/mL. A negative result does not preclude SARS-Cov-2 infection and should not be used as the sole basis for treatment or other patient management decisions. A negative result may occur with  improper specimen collection/handling, submission of specimen other than nasopharyngeal swab, presence of viral mutation(s) within the areas targeted by this assay, and inadequate number of viral copies(<138 copies/mL). A negative result must be combined with clinical observations, patient history, and epidemiological information. The expected result is Negative.  Fact Sheet for Patients:  EntrepreneurPulse.com.au  Fact Sheet for Healthcare Providers:  IncredibleEmployment.be  This test is no t yet approved or cleared by the [place] FDA and  has been authorized for detection and/or diagnosis of SARS-CoV-2 by FDA under an Emergency Use Authorization (EUA). This EUA will remain  in effect (meaning this test can be used) for the duration of the COVID-19 declaration under Section 564(b)(1) of the Act, 21 U.S.C.section 360bbb-3(b)(1), unless the authorization is terminated  or revoked sooner.       Influenza A by PCR NEGATIVE NEGATIVE Final   Influenza B by PCR NEGATIVE NEGATIVE Final    Comment: (NOTE) The Xpert Xpress SARS-CoV-2/FLU/RSV plus assay is  intended as an aid in the diagnosis of influenza from Nasopharyngeal swab specimens and should not be used as a sole basis for treatment. Nasal washings and aspirates are unacceptable for Xpert Xpress SARS-CoV-2/FLU/RSV testing.  Fact Sheet for Patients: EntrepreneurPulse.com.au  Fact Sheet for Healthcare Providers: IncredibleEmployment.be  This test is not yet approved or cleared by the [place] FDA and has been authorized for detection and/or diagnosis of SARS-CoV-2 by FDA under an Emergency Use Authorization (EUA). This EUA will remain in effect (meaning this test can be used) for the duration of the COVID-19 declaration under Section 564(b)(1) of the Act, 21 U.S.C. section 360bbb-3(b)(1), unless the authorization  is terminated or revoked.  Performed at [facility], [address] [place], [place] [address]   Urine Culture     Status: Abnormal   Collection Time: 11/27/20  2:15 PM   Specimen: Urine, Clean Catch  Result Value Ref Range Status   Specimen Description   Final    URINE, CLEAN CATCH Performed at [facility], [address] [place], [place] [address]    Special Requests   Final    NONE Performed at [facility], [address] [place], [place] [address]    Culture 20,000 COLONIES/mL ENTEROBACTER AEROGENES (A)  Final   Report Status 11/30/2020 FINAL  Final   Organism ID, Bacteria ENTEROBACTER AEROGENES (A)  Final      Susceptibility   Enterobacter aerogenes - MIC*    CEFAZOLIN >=64 RESISTANT Resistant     CEFEPIME 1 SENSITIVE Sensitive     CEFTRIAXONE >=64 RESISTANT Resistant     CIPROFLOXACIN <=0.25 SENSITIVE Sensitive     GENTAMICIN <=1 SENSITIVE Sensitive     IMIPENEM <=0.25 SENSITIVE Sensitive     NITROFURANTOIN 64 INTERMEDIATE Intermediate     TRIMETH/SULFA <=20 SENSITIVE Sensitive     PIP/TAZO >=128 RESISTANT Resistant     * 20,000 COLONIES/mL ENTEROBACTER AEROGENES  CULTURE,  BLOOD (ROUTINE X 2) w Reflex to ID Panel     Status: None   Collection Time: 11/27/20  8:23 PM   Specimen: BLOOD  Result Value Ref Range Status   Specimen Description BLOOD LAC  Final   Special Requests   Final    BOTTLES DRAWN AEROBIC AND ANAEROBIC Blood Culture results may not be optimal due to an inadequate volume of blood received in culture bottles   Culture   Final    NO GROWTH 5 DAYS Performed at [facility], [address] [place], [place] [address]    Report Status 12/02/2020 FINAL  Final  CULTURE, BLOOD (ROUTINE X 2) w Reflex to ID Panel     Status: None   Collection Time: 11/27/20  9:32 PM   Specimen: BLOOD LEFT HAND  Result Value Ref Range Status   Specimen Description BLOOD LEFT HAND  Final   Special Requests   Final    BOTTLES DRAWN AEROBIC AND ANAEROBIC Blood Culture adequate volume   Culture   Final    NO GROWTH 5 DAYS Performed at [facility], [address] [place], [place] [address]    Report Status 12/02/2020 FINAL  Final         Radiology Studies: No results found.      Scheduled Meds:  Chlorhexidine Gluconate Cloth  6 each Topical Daily   dextrose  12.5 g Intravenous Once   diltiazem  120 mg Oral Daily   feeding supplement  1 Container Oral TID BM   heparin lock flush       hydrALAZINE  50 mg Oral Q8H   insulin aspart  0-15 Units Subcutaneous TID WC   insulin glargine-yfgn  8 Units Subcutaneous Daily   levothyroxine  100 mcg Oral QAC breakfast   magnesium oxide  800 mg Oral BID   mouth rinse  15 mL Mouth Rinse q12n4p   midazolam       mirtazapine  15 mg Oral QHS   multivitamin with minerals  1 tablet Oral Daily   pantoprazole  40 mg Oral Daily   rosuvastatin  10 mg Oral QHS   sodium chloride flush  10-40 mL Intracatheter Q12H   thiamine  100 mg Oral Daily   Continuous Infusions:  doxycycline (VIBRAMYCIN) IV Stopped (12/04/20 0130)     LOS: 14 days    Time spent: 25 minutes    Sidney Ace,  MD [facility] Pager [phone]  If 7PM-7AM, please contact night-coverage 12/04/2020, 9:55 AM

## 2020-12-04 NOTE — Progress Notes (Signed)
[facility]  [phone] [phone]  ID: Kandy Garrison OB: [birth year]  MR#: [id number]  YQI#:[id number]  Patient Care Team: [name], MD as PCP - General (Family Medicine)  CHIEF COMPLAINT: Altered mental status/confusion, acute pancreatitis, pancytopenia  INTERVAL HISTORY: Patient more alert today and conversive.  Where she is getting a bone marrow biopsy later.  Asking when she is going to be discharged.  Offers no specific complaints.  Husband at bedside.  REVIEW OF SYSTEMS:   Review of Systems  Constitutional:  Positive for fever and malaise/fatigue. Negative for weight loss.  Respiratory: Negative.  Negative for cough, hemoptysis and shortness of breath.   Cardiovascular: Negative.  Negative for chest pain and leg swelling.  Gastrointestinal: Negative.  Negative for abdominal pain.  Genitourinary: Negative.  Negative for dysuria.  Musculoskeletal: Negative.  Negative for back pain.  Skin: Negative.  Negative for rash.  Neurological:  Positive for weakness. Negative for dizziness, focal weakness and headaches.  Endo/Heme/Allergies:  Does not bruise/bleed easily.  Psychiatric/Behavioral:  Positive for memory loss. The patient is not nervous/anxious.     PAST MEDICAL HISTORY: Past Medical History:  Diagnosis Date   Acute hemorrhoid 03/11/2015   Anxiety    Arthritis    Asthma    Cancer ([place])    Chronic back pain    CLL (chronic lymphocytic leukemia) ([place])    Depression    Diabetes mellitus without complication ([place])    Genital herpes    type 2   Hypertension    Hypothyroidism    Vertigo     PAST SURGICAL HISTORY: Past Surgical History:  Procedure Laterality Date   ABDOMINAL HYSTERECTOMY     BILATERAL SALPINGOOPHORECTOMY  2009   BREAST BIOPSY Right 05/17/2016   FIBROADENOMATOUS CHANGE AND SCLEROSING ADENOSIS WITH   COLONOSCOPY WITH PROPOFOL N/A 06/16/2015   Procedure: COLONOSCOPY WITH PROPOFOL;  Surgeon: [name], MD;  Location: [facility] ENDOSCOPY;  Service: Endoscopy;  Laterality: N/A;   LAPAROSCOPIC SUPRACERVICAL HYSTERECTOMY  2009   due to [place] Bilateral 06/17/2020   Procedure: THORACIC LAMINECTOMY FOR EPIDURAL ABSCESS;  Surgeon: Deetta Perla, MD;  Location: [facility] ORS;  Service: Neurosurgery;  Laterality: Bilateral;    FAMILY HISTORY: Family History  Problem Relation Age of Onset   Cancer Paternal Aunt    Breast cancer Maternal Aunt 26    ADVANCED DIRECTIVES (Y/N):  _0 @  HEALTH MAINTENANCE: Social History   Tobacco Use   Smoking status: Never   Smokeless tobacco: Never  Vaping Use   Vaping Use: Never used  Substance Use Topics   Alcohol use: Yes    Alcohol/week: 1.0 standard drink    Types: 1 Cans of beer per week   Drug use: No     Colonoscopy:  PAP:  Bone density:  Lipid panel:  Allergies  Allergen Reactions   Amoxapine    Penicillins     Current Facility-Administered Medications  Medication Dose Route Frequency Provider Last Rate Last Admin   acetaminophen (TYLENOL) tablet 650 mg  650 mg Oral Q6H PRN Cox, Amy N, DO   650 mg at 12/03/20 0023   Or   acetaminophen (TYLENOL) suppository 650 mg  650 mg Rectal Q6H PRN Cox, Amy N, DO       albuterol (PROVENTIL) (2.5 MG/3ML) 0.083% nebulizer solution 3 mL  3 mL Inhalation Q6H PRN Cox, Amy N, DO       Chlorhexidine Gluconate Cloth 2 %  PADS 6 each  6 each Topical Daily Enzo Bi, MD   6 each at 12/04/20 0923   dextrose 50 % solution 12.5 g  12.5 g Intravenous Once Ralene Muskrat B, MD       diltiazem (CARDIZEM CD) 24 hr capsule 120 mg  120 mg Oral Daily Cox, Amy N, DO   120 mg at 12/04/20 0749   doxycycline (VIBRAMYCIN) 100 mg in sodium chloride 0.9 % 250 mL IVPB  100 mg Intravenous Q12H [name], MD   Stopped at 12/04/20 0130   feeding supplement (BOOST / RESOURCE BREEZE) liquid 1 Container  1 Container Oral TID BM Enzo Bi, MD   1  Container at 12/03/20 2002   haloperidol lactate (HALDOL) injection 2 mg  2 mg Intravenous Q15 min PRN Enzo Bi, MD       hydrALAZINE (APRESOLINE) tablet 50 mg  50 mg Oral Q8H Sreenath, Sudheer B, MD   50 mg at 12/04/20 3614   insulin aspart (novoLOG) injection 0-15 Units  0-15 Units Subcutaneous TID WC Enzo Bi, MD   3 Units at 12/03/20 1306   insulin glargine-yfgn (SEMGLEE) injection 10 Units  10 Units Subcutaneous Daily Enzo Bi, MD   10 Units at 12/03/20 0957   levothyroxine (SYNTHROID) tablet 100 mcg  100 mcg Oral QAC breakfast Ralene Muskrat B, MD   100 mcg at 12/04/20 4315   magnesium oxide (MAG-OX) tablet 800 mg  800 mg Oral BID Oswald Hillock, RPH   800 mg at 12/04/20 4008   MEDLINE mouth rinse  15 mL Mouth Rinse q12n4p Ralene Muskrat B, MD   15 mL at 12/03/20 1704   mirtazapine (REMERON) tablet 15 mg  15 mg Oral QHS Ralene Muskrat B, MD   15 mg at 12/03/20 2002   multivitamin with minerals tablet 1 tablet  1 tablet Oral Daily Ralene Muskrat B, MD   1 tablet at 12/04/20 0749   ondansetron (ZOFRAN) tablet 4 mg  4 mg Oral Q6H PRN Cox, Amy N, DO       Or   ondansetron (ZOFRAN) injection 4 mg  4 mg Intravenous Q6H PRN Cox, Amy N, DO   4 mg at 11/23/20 0751   pantoprazole (PROTONIX) EC tablet 40 mg  40 mg Oral Daily Cox, Amy N, DO   40 mg at 12/04/20 0749   polyethylene glycol (MIRALAX / GLYCOLAX) packet 34 g  34 g Oral BID PRN Enzo Bi, MD       rosuvastatin (CRESTOR) tablet 10 mg  10 mg Oral QHS Cox, Amy N, DO   10 mg at 12/03/20 2002   sodium chloride flush (NS) 0.9 % injection 10-40 mL  10-40 mL Intracatheter Q12H Enzo Bi, MD   10 mL at 12/04/20 0750   sodium chloride flush (NS) 0.9 % injection 10-40 mL  10-40 mL Intracatheter PRN Enzo Bi, MD       thiamine tablet 100 mg  100 mg Oral Daily Oswald Hillock, RPH   100 mg at 12/04/20 0749    OBJECTIVE: Vitals:   12/04/20 0419 12/04/20 0728  BP: (!) 142/74 (!) 141/75  Pulse: 72 75  Resp: 18 18  Temp: 98.7 F (37.1  C) 99.8 F (37.7 C)  SpO2: 98% 99%     Body mass index is 28.23 kg/m.    ECOG FS:3 - Symptomatic, >50% confined to bed  General: Well-developed, well-nourished, no acute distress. Eyes: Pink conjunctiva, anicteric sclera. HEENT: Normocephalic, moist mucous membranes. Lungs: No audible  wheezing or coughing. Heart: Regular rate and rhythm. Abdomen: Soft, nontender, no obvious distention. Musculoskeletal: No edema, cyanosis, or clubbing. Neuro: Alert, answering all questions appropriately. Cranial nerves grossly intact. Skin: No rashes or petechiae noted. Psych: Normal affect.   LAB RESULTS:  Lab Results  Component Value Date   NA 136 12/04/2020   K 3.7 12/04/2020   CL 109 12/04/2020   CO2 20 (L) 12/04/2020   GLUCOSE 79 12/04/2020   BUN 19 12/04/2020   CREATININE 2.66 (H) 12/04/2020   CALCIUM 7.7 (L) 12/04/2020   PROT 5.0 (L) 12/03/2020   ALBUMIN 1.9 (L) 12/03/2020   AST 62 (H) 12/03/2020   ALT 12 12/03/2020   ALKPHOS 75 12/03/2020   BILITOT 0.6 12/03/2020   GFRNONAA 20 (L) 12/04/2020   GFRAA >60 01/17/2020    Lab Results  Component Value Date   WBC 3.6 (L) 12/04/2020   NEUTROABS 2.7 12/04/2020   HGB 7.8 (L) 12/04/2020   HCT 23.4 (L) 12/04/2020   MCV 81.8 12/04/2020   PLT 47 (L) 12/04/2020     STUDIES: CT Head Wo Contrast  Result Date: 11/19/2020 CLINICAL DATA:  Mental status changes of unknown cause. EXAM: CT HEAD WITHOUT CONTRAST TECHNIQUE: Contiguous axial images were obtained from the base of the skull through the vertex without intravenous contrast. COMPARISON:  10/02/2020 FINDINGS: Brain: The brain shows a normal appearance without evidence of malformation, atrophy, old or acute small or large vessel infarction, mass lesion, hemorrhage, hydrocephalus or extra-axial collection. Vascular: No hyperdense vessel. No evidence of atherosclerotic calcification. Skull: Normal.  No traumatic finding.  No focal bone lesion. Sinuses/Orbits: Sinuses are clear. Orbits  appear normal. Mastoids are clear. Other: None significant IMPRESSION: Normal head CT. Electronically Signed   By: Nelson Chimes M.D.   On: 11/19/2020 16:19   CT HEAD W & WO CONTRAST  Result Date: 11/21/2020 CLINICAL DATA:  Metastatic disease evaluation. Additional provided: Patient with history of recent pneumococcal meningitis and bacteremia, diabetes mellitus presenting with altered mental status. EXAM: CT HEAD WITHOUT AND WITH CONTRAST TECHNIQUE: Contiguous axial images were obtained from the base of the skull through the vertex without and with intravenous contrast CONTRAST:  174m OMNIPAQUE IOHEXOL 350 MG/ML SOLN COMPARISON:  Prior head CT examinations 11/19/2020 and earlier. FINDINGS: Brain: Cerebral volume is normal for age. There is no acute intracranial hemorrhage. No demarcated cortical infarct. No extra-axial fluid collection. No evidence of an intracranial mass. No midline shift. No pathologic intracranial enhancement is identified. Vascular: Atherosclerotic calcifications. Skull: Normal. Negative for fracture or focal suspicious osseous lesion. Sinuses/Orbits: Visualized orbits show no acute finding. Minimal frothy secretions within the left sphenoid sinus. Small right sphenoid sinus mucous retention cyst. Minimal partial opacification of the posterior right ethmoid air cells. IMPRESSION: No evidence of acute intracranial abnormality. No CT evidence of intracranial metastatic disease. Mild paranasal sinus disease at the imaged levels, as described. Electronically Signed   By: KKellie SimmeringDO   On: 11/21/2020 15:42   MR BRAIN W WO CONTRAST  Result Date: 11/28/2020 CLINICAL DATA:  Mental status change, unknown cause; encephalopathy of unknown cause; recurrent fever. EXAM: MRI HEAD WITHOUT AND WITH CONTRAST TECHNIQUE: Multiplanar, multiecho pulse sequences of the brain and surrounding structures were obtained without and with intravenous contrast. CONTRAST:  849mGADAVIST GADOBUTROL 1 MMOL/ML IV SOLN  COMPARISON:  Head CT November 21, 2020 FINDINGS: Brain: Bilateral T2 hyperintense subdural fluid collections measuring up to 5 mm on the right and 6 mm on the left with mild mass  effect on the adjacent brain parenchyma without midline shift. Postcontrast images are severely degraded by motion. Mild diffuse pachymeningeal contrast enhancement seen. The brain parenchyma has normal morphology and signal characteristics. No acute infarction, hemorrhage, hydrocephalus, or mass lesion. Vascular: Normal flow voids. Skull and upper cervical spine: Normal marrow signal. Sinuses/Orbits: Negative. Other: None. IMPRESSION: Interval development of small bilateral subdural fluid collections, may be related to recent lumbar puncture. Electronically Signed   By: Pedro Earls M.D.   On: 11/28/2020 14:54   MR CERVICAL SPINE W WO CONTRAST  Result Date: 11/28/2020 CLINICAL DATA:  History of epidural abscess and paraspinal abscess in the setting of recurrent fever and altered mental status. EXAM: MRI CERVICAL, THORACIC AND LUMBAR SPINE WITHOUT AND WITH CONTRAST TECHNIQUE: Multiplanar and multiecho pulse sequences of the cervical spine, to include the craniocervical junction and cervicothoracic junction, and thoracic and lumbar spine, were obtained without and with intravenous contrast. CONTRAST:  46m GADAVIST GADOBUTROL 1 MMOL/ML IV SOLN COMPARISON:  MRI of the cervical, thoracic and lumbar spine June 17, 2020. FINDINGS: The study is partially degraded by motion. Postcontrast axial images are essentially nondiagnostic. MRI CERVICAL SPINE FINDINGS Alignment: Physiologic. Vertebrae: No fracture, evidence of discitis, or bone lesion. Cord: Increased T2 signal within the cord at the C3 level. Posterior Fossa, vertebral arteries, paraspinal tissues: Negative. Disc levels: C2-3: Posterior disc protrusion causing mild mass effect on the cord without significant spinal canal stenosis. Facet degenerative changes resulting  mild left neural foraminal narrowing. C3-4: Left posterolateral disc protrusion causing moderate to severe spinal canal stenosis with mass effect on the cord. Increased T2 signal is seen within the cord immediately above the level of compression, likely edema. Uncovertebral and facet degenerative changes resulting in moderate right and severe left neural foraminal narrowing. C4-5: Posterior disc protrusion asymmetric to the left causing mass effect on the cord and resulting in moderate spinal canal stenosis. Uncovertebral and facet degenerative changes resulting in severe bilateral neural foraminal narrowing. C5-6: Posterior disc osteophyte complex causing mass effect on the cord and resulting in moderate spinal canal stenosis. Uncovertebral and facet degenerative changes resulting severe bilateral neural foraminal narrowing. C6-7: Left posterolateral disc protrusion causing indentation of the thecal sac and resulting mild spinal canal stenosis. Uncovertebral and facet degenerative changes resulting in mild bilateral neural foraminal narrowing. C7-T1: Left posterolateral disc protrusion causing indentation on the thecal sac and resulting in mild left neural foraminal narrowing. No significant spinal canal stenosis. MRI THORACIC SPINE FINDINGS Alignment:  Physiologic. Vertebrae: No fracture, evidence of discitis, or bone lesion. Postsurgical changes from T3-4, T5-7 and T9-10 laminectomies. Interval resolution of the large posterior epidural fluid collection. Cord: Central increased T2 signal within the cord at T6-7, T7-8 and from the T8-9 level to the conus medullaris. Paraspinal and other soft tissues: Small bilateral pleural effusion. A 1.5 cm T2 hyperintense hepatic lesion. Disc levels: Tiny posterior disc protrusions at T3-4, T5-6, T6-7 and T7-8 causing minimal indentation on the thecal sac. No significant spinal canal or neural foraminal stenosis at any thoracic level. MRI LUMBAR SPINE FINDINGS Segmentation:   Standard. Alignment:  Physiologic. Vertebrae: No fracture, evidence of discitis, or bone lesion. Endplate degenerative changes at L5-S1. Conus medullaris and cauda equina: Conus extends to the L1-2 level. Increased T2 signal within the conus medullaris, as described above. 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 Paraspinal and other soft tissues: Negative. Disc levels: T12-L1: No spinal canal or neural foraminal stenosis. L1-2:  No spinal canal or neural foraminal stenosis. L2-3: Shallow disc bulge and moderate facet degenerative changes without significant spinal canal or neural foraminal stenosis. L3-4: Moderate facet degenerative changes. No spinal canal or neural foraminal stenosis. L4-5: Mild loss of disc height, disc bulge and moderate facet degenerative changes without significant spinal canal or neural foraminal stenosis. L5-S1: Loss of disc height, disc bulge with superimposed left central/subarticular disc protrusion and moderate facet degenerative changes. Findings result in narrowing of the left subarticular zone with displacement of the traversing left S1 nerve root. No significant neural foraminal narrowing. IMPRESSION: 1. The study is degraded by motion, particularly the postcontrast images. 2. Interval resolution of the large posterior thoracic epidural fluid collection with postsurgical changes from multilevel laminectomy noted in the thoracic spine. No new fluid collection identified. 3. Increased T2 signal within the cord centrally at T6-7, T7-8 and from the T8-9 level to the conus medullaris without cord expansion. This may represent post ischemic changes related to now resolved cord compression. 4. 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 5. Advanced degenerative changes of the cervical spine, with moderate to severe spinal canal stenosis at C3-4 and  moderate at C4-5 and C5-6. 6. Multilevel high-grade neural foraminal narrowing at the cervical spine. 7. Mild degenerative changes of the thoracic spine without spinal canal or neural foraminal stenosis. 8. Degenerative disc disease at L5-S1 with narrowing of the left subarticular zone and likely impingement of the traversing left S1 nerve root. Electronically Signed   By: Pedro Earls M.D.   On: 11/28/2020 15:41   MR THORACIC SPINE W WO CONTRAST  Result Date: 11/28/2020 CLINICAL DATA:  History of epidural abscess and paraspinal abscess in the setting of recurrent fever and altered mental status. EXAM: MRI CERVICAL, THORACIC AND LUMBAR SPINE WITHOUT AND WITH CONTRAST TECHNIQUE: Multiplanar and multiecho pulse sequences of the cervical spine, to include the craniocervical junction and cervicothoracic junction, and thoracic and lumbar spine, were obtained without and with intravenous contrast. CONTRAST:  40m GADAVIST GADOBUTROL 1 MMOL/ML IV SOLN COMPARISON:  MRI of the cervical, thoracic and lumbar spine June 17, 2020. FINDINGS: The study is partially degraded by motion. Postcontrast axial images are essentially nondiagnostic. MRI CERVICAL SPINE FINDINGS Alignment: Physiologic. Vertebrae: No fracture, evidence of discitis, or bone lesion. Cord: Increased T2 signal within the cord at the C3 level. Posterior Fossa, vertebral arteries, paraspinal tissues: Negative. Disc levels: C2-3: Posterior disc protrusion causing mild mass effect on the cord without significant spinal canal stenosis. Facet degenerative changes resulting mild left neural foraminal narrowing. C3-4: Left posterolateral disc protrusion causing moderate to severe spinal canal stenosis with mass effect on the cord. Increased T2 signal is seen within the cord immediately above the level of compression, likely edema. Uncovertebral and facet degenerative changes resulting in moderate right and severe left neural foraminal narrowing.  C4-5: Posterior disc protrusion asymmetric to the left causing mass effect on the cord and resulting in moderate spinal canal stenosis. Uncovertebral and facet degenerative changes resulting in severe bilateral neural foraminal narrowing. C5-6: Posterior disc osteophyte complex causing mass effect on the cord and resulting in moderate spinal canal stenosis. Uncovertebral and facet degenerative changes resulting severe bilateral neural foraminal narrowing. C6-7: Left posterolateral disc protrusion causing indentation of the thecal sac and resulting mild spinal canal stenosis. Uncovertebral and facet degenerative changes resulting in mild bilateral neural foraminal narrowing. C7-T1: Left posterolateral disc protrusion causing indentation on the  thecal sac and resulting in mild left neural foraminal narrowing. No significant spinal canal stenosis. MRI THORACIC SPINE FINDINGS Alignment:  Physiologic. Vertebrae: No fracture, evidence of discitis, or bone lesion. Postsurgical changes from T3-4, T5-7 and T9-10 laminectomies. Interval resolution of the large posterior epidural fluid collection. Cord: Central increased T2 signal within the cord at T6-7, T7-8 and from the T8-9 level to the conus medullaris. Paraspinal and other soft tissues: Small bilateral pleural effusion. A 1.5 cm T2 hyperintense hepatic lesion. Disc levels: Tiny posterior disc protrusions at T3-4, T5-6, T6-7 and T7-8 causing minimal indentation on the thecal sac. No significant spinal canal or neural foraminal stenosis at any thoracic level. MRI LUMBAR SPINE FINDINGS Segmentation:  Standard. Alignment:  Physiologic. Vertebrae: No fracture, evidence of discitis, or bone lesion. Endplate degenerative changes at L5-S1. Conus medullaris and cauda equina: Conus extends to the L1-2 level. Increased T2 signal within the conus medullaris, as described above. There is clumping of the roots of the cauda equina, particularly at L2-3 suggesting adhesive  arachnoiditis. Paraspinal and other soft tissues: Negative. Disc levels: T12-L1: No spinal canal or neural foraminal stenosis. L1-2: No spinal canal or neural foraminal stenosis. L2-3: Shallow disc bulge and moderate facet degenerative changes without significant spinal canal or neural foraminal stenosis. L3-4: Moderate facet degenerative changes. No spinal canal or neural foraminal stenosis. L4-5: Mild loss of disc height, disc bulge and moderate facet degenerative changes without significant spinal canal or neural foraminal stenosis. L5-S1: Loss of disc height, disc bulge with superimposed left central/subarticular disc protrusion and moderate facet degenerative changes. Findings result in narrowing of the left subarticular zone with displacement of the traversing left S1 nerve root. No significant neural foraminal narrowing. IMPRESSION: 1. The study is degraded by motion, particularly the postcontrast images. 2. Interval resolution of the large posterior thoracic epidural fluid collection with postsurgical changes from multilevel laminectomy noted in the thoracic spine. No new fluid collection identified. 3. Increased T2 signal within the cord centrally at T6-7, T7-8 and from the T8-9 level to the conus medullaris without cord expansion. This may represent post ischemic changes related to now resolved cord compression. 4. 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 5. 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 6. Multilevel high-grade neural foraminal narrowing at the cervical spine. 7. Mild degenerative changes of the thoracic spine without spinal canal or neural foraminal stenosis. 8. Degenerative disc disease at L5-S1 with narrowing of the left subarticular zone and likely impingement of the traversing left S1 nerve root. Electronically Signed    By: Pedro Earls M.D.   On: 11/28/2020 15:41   MR Lumbar Spine W Wo Contrast  Result Date: 11/28/2020 CLINICAL DATA:  History of epidural abscess and paraspinal abscess in the setting of recurrent fever and altered mental status. EXAM: MRI CERVICAL, THORACIC AND LUMBAR SPINE WITHOUT AND WITH CONTRAST TECHNIQUE: Multiplanar and multiecho pulse sequences of the cervical spine, to include the craniocervical junction and cervicothoracic junction, and thoracic and lumbar spine, were obtained without and with intravenous contrast. CONTRAST:  16m GADAVIST GADOBUTROL 1 MMOL/ML IV SOLN COMPARISON:  MRI of the cervical, thoracic and lumbar spine June 17, 2020. FINDINGS: The study is partially degraded by motion. Postcontrast axial images are essentially nondiagnostic. MRI CERVICAL SPINE FINDINGS Alignment: Physiologic. Vertebrae: No fracture, evidence of discitis, or bone lesion. Cord:  Increased T2 signal within the cord at the C3 level. Posterior Fossa, vertebral arteries, paraspinal tissues: Negative. Disc levels: C2-3: Posterior disc protrusion causing mild mass effect on the cord without significant spinal canal stenosis. Facet degenerative changes resulting mild left neural foraminal narrowing. C3-4: Left posterolateral disc protrusion causing moderate to severe spinal canal stenosis with mass effect on the cord. Increased T2 signal is seen within the cord immediately above the level of compression, likely edema. Uncovertebral and facet degenerative changes resulting in moderate right and severe left neural foraminal narrowing. C4-5: Posterior disc protrusion asymmetric to the left causing mass effect on the cord and resulting in moderate spinal canal stenosis. Uncovertebral and facet degenerative changes resulting in severe bilateral neural foraminal narrowing. C5-6: Posterior disc osteophyte complex causing mass effect on the cord and resulting in moderate spinal canal stenosis. Uncovertebral  and facet degenerative changes resulting severe bilateral neural foraminal narrowing. C6-7: Left posterolateral disc protrusion causing indentation of the thecal sac and resulting mild spinal canal stenosis. Uncovertebral and facet degenerative changes resulting in mild bilateral neural foraminal narrowing. C7-T1: Left posterolateral disc protrusion causing indentation on the thecal sac and resulting in mild left neural foraminal narrowing. No significant spinal canal stenosis. MRI THORACIC SPINE FINDINGS Alignment:  Physiologic. Vertebrae: No fracture, evidence of discitis, or bone lesion. Postsurgical changes from T3-4, T5-7 and T9-10 laminectomies. Interval resolution of the large posterior epidural fluid collection. Cord: Central increased T2 signal within the cord at T6-7, T7-8 and from the T8-9 level to the conus medullaris. Paraspinal and other soft tissues: Small bilateral pleural effusion. A 1.5 cm T2 hyperintense hepatic lesion. Disc levels: Tiny posterior disc protrusions at T3-4, T5-6, T6-7 and T7-8 causing minimal indentation on the thecal sac. No significant spinal canal or neural foraminal stenosis at any thoracic level. MRI LUMBAR SPINE FINDINGS Segmentation:  Standard. Alignment:  Physiologic. Vertebrae: No fracture, evidence of discitis, or bone lesion. Endplate degenerative changes at L5-S1. Conus medullaris and cauda equina: Conus extends to the L1-2 level. Increased T2 signal within the conus medullaris, as described above. 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 Paraspinal and other soft tissues: Negative. Disc levels: T12-L1: No spinal canal or neural foraminal stenosis. L1-2: No spinal canal or neural foraminal stenosis. L2-3: Shallow disc bulge and moderate facet degenerative changes without significant spinal canal or neural foraminal stenosis. L3-4: Moderate facet degenerative changes. No spinal canal or neural foraminal  stenosis. L4-5: Mild loss of disc height, disc bulge and moderate facet degenerative changes without significant spinal canal or neural foraminal stenosis. L5-S1: Loss of disc height, disc bulge with superimposed left central/subarticular disc protrusion and moderate facet degenerative changes. Findings result in narrowing of the left subarticular zone with displacement of the traversing left S1 nerve root. No significant neural foraminal narrowing. IMPRESSION: 1. The study is degraded by motion, particularly the postcontrast images. 2. Interval resolution of the large posterior thoracic epidural fluid collection with postsurgical changes from multilevel laminectomy noted in the thoracic spine. No new fluid collection identified. 3. Increased T2 signal within the cord centrally at T6-7, T7-8 and from the T8-9 level to the conus medullaris without cord expansion. This may represent post ischemic changes related to now resolved cord compression. 4. 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 5. Advanced degenerative changes of the cervical spine, with moderate to severe spinal canal stenosis at C3-4 and moderate  at C4-5 and C5-6. 6. Multilevel high-grade neural foraminal narrowing at the cervical spine. 7. Mild degenerative changes of the thoracic spine without spinal canal or neural foraminal stenosis. 8. Degenerative disc disease at L5-S1 with narrowing of the left subarticular zone and likely impingement of the traversing left S1 nerve root. Electronically Signed   By: Pedro Earls M.D.   On: 11/28/2020 15:41   US RENAL  Result Date: 12/01/2020 CLINICAL DATA:  Acute kidney injury, history CLL, diabetes mellitus, hypertension EXAM: RENAL / URINARY TRACT ULTRASOUND COMPLETE COMPARISON:  CT abdomen and pelvis 11/21/2020 FINDINGS: Right Kidney: Renal measurements: 11.4 x 5.8 x 6.0 cm = volume: 206 mL. Normal cortical thickness.  Increased cortical echogenicity. No mass, hydronephrosis, or shadowing calcification. Left Kidney: Renal measurements: 13.1 x 7.3 x 6.1 cm = volume: 3 L4 mL. Normal cortical thickness. Increased cortical echogenicity. Tiny cyst at mid kidney 9 x 13 x 13 mm, simple features. No additional mass, hydronephrosis, or shadowing calcification. Bladder: Partially distended. Cystic intraluminal focus at the posterior wall RIGHT of midline question RIGHT ureterocele approximately 16 x 6 x 9 mm in size. Other: N/A IMPRESSION: Medical renal disease changes of both kidneys. Intraluminal cystic focus posterior RIGHT bladder, favor representing a small RIGHT ureterocele. Electronically Signed   By: Lavonia Dana M.D.   On: 12/01/2020 16:26   CT CHEST ABDOMEN PELVIS W CONTRAST  Result Date: 11/21/2020 CLINICAL DATA:  Altered mental status. Recent history of pneumococcal meningitis and bacteremia. Coffee ground emesis. EXAM: CT CHEST, ABDOMEN, AND PELVIS WITH CONTRAST TECHNIQUE: Multidetector CT imaging of the chest, abdomen and pelvis was performed following the standard protocol during bolus administration of intravenous contrast. CONTRAST:  100 mL OMNIPAQUE IOHEXOL 350 MG/ML SOLN COMPARISON:  CT abdomen and pelvis 05/23/2020. FINDINGS: CT CHEST FINDINGS Cardiovascular: There is mild cardiomegaly and a small pericardial effusion. No atherosclerosis is seen. Mediastinum/Nodes: Innumerable axillary and subpectoral lymph nodes are new since the prior chest CT. Index node in the right axilla measuring 1 cm short axis dimension is seen on image 21 of series 3. A left subpectoral node measuring 1.3 cm is seen on image 24. Small mediastinal lymph nodes include a precarinal node on image 27 measuring 1 cm. The esophagus and thyroid are negative. Lungs/Pleura: There is some dependent atelectasis. A 0.7 x 0.5 cm right upper lobe nodule on image 72 of series 6 measure approximately 0.7 x 0.4 cm on the prior exam. There is extensive  micronodularity in the right upper lobe. Musculoskeletal: No acute or focal abnormality CT ABDOMEN PELVIS FINDINGS Hepatobiliary: No focal liver abnormality is seen. No gallstones, gallbladder wall thickening, or biliary dilatation. The liver is diffusely low attenuating consistent with marked fatty infiltration. Pancreas: Vanessa [place] is present about the pancreas throughout is consistent with acute pancreatitis. No focal fluid collection. The pancreas enhances homogeneously. Spleen: Normal in size without focal abnormality. Adrenals/Urinary Tract: The adrenal glands appear normal. A single small cyst is seen in the left kidney. On delayed imaging, there are areas of decreased attenuation in both kidneys which may be artifactual. No hydronephrosis. No ureteral or urinary bladder stone. Stomach/Bowel: Stomach is within normal limits. Appendix appears normal. No evidence of bowel wall thickening, distention, or inflammatory changes. Vascular/Lymphatic: No significant vascular findings are present. No enlarged abdominal or pelvic lymph nodes. Reproductive: Status post hysterectomy. No adnexal masses. Other: None. Musculoskeletal: No acute or focal bony abnormality. IMPRESSION: The examination is positive for findings consistent with acute pancreatitis without pseudocyst formation or pancreatic  necrosis. Micronodularity in the right upper lobe is worrisome for bronchopneumonia. 0.7 x 0.5 cm right upper lobe nodule has mildly increased in size since 2017. Non-contrast chest CT at 6-12 months is recommended. If the nodule is stable at time of repeat CT, then future CT at 18-24 months (from today's scan) is considered optional for low-risk patients, but is recommended for high-risk patients. This recommendation follows the consensus statement: Guidelines for Management of Incidental Pulmonary Nodules Detected on CT Images: From the Fleischner Society 2017; Radiology 2017; 284:228-243. Multiple axillary and subpectoral lymph  nodes are abnormal in number and likely related to this patient's history of CLL. Cardiomegaly and small pericardial effusion. Marked fatty infiltration of the liver. Decreased cortical attenuation the kidneys is likely artifactual. Correlation with urinalysis could be used to exclude pyelonephritis. Electronically Signed   By: Inge Rise M.D.   On: 11/21/2020 16:55   [place] UPPER EXTREMITY DUPLEX BILATERAL (NON-WBI)  Result Date: 11/05/2020 CLINICAL DATA:  Numbness, tingling and coldness in the upper extremities EXAM: BILATERAL UPPER EXTREMITY ARTERIAL DUPLEX SCAN TECHNIQUE: Gray-scale sonography as well as color Doppler and duplex ultrasound was performed to evaluate the arteries of both upper extremities. COMPARISON:  None. FINDINGS: Right upper extremity: Normal triphasic arterial waveforms throughout the subclavian, axillary, brachial, radial and ulnar artery. No focal atherosclerotic plaque. No evidence of stenosis or occlusion. Left upper extremity: Normal triphasic arterial waveforms throughout the subclavian, axillary, brachial, radial and ulnar artery. No focal atherosclerotic plaque. No evidence of stenosis or occlusion. IMPRESSION: 1. No evidence of hemodynamically significant stenosis or occlusion in either upper extremity arterial tree. 2. Incidentally and incompletely imaged prominent lymph nodes in the axillary spaces bilaterally. These are of uncertain significance and may be reactive in nature. If the patient has palpable lymphadenopathy, or other signs and symptoms suspicious for a lymphoproliferative process, then further imaging (contrast-enhanced CT scan of the chest) may be warranted. Electronically Signed   By: Jacqulynn Cadet M.D.   On: 11/05/2020 15:46   DG Chest Port 1 View  Result Date: 11/27/2020 CLINICAL DATA:  Fever and chest pain. EXAM: PORTABLE CHEST 1 VIEW COMPARISON:  And CT chest, abdomen and pelvis 11/21/2020. Single-view of the chest 10/01/2020. FINDINGS: Subtle micro  nodules are seen in the right upper lobe. The left lung demonstrates minimal atelectasis in the base. Heart size is normal. No pneumothorax or pleural fluid. No acute or focal bony abnormality. IMPRESSION: Subtle focus of mild micronodularity in the right upper lobe compatible with residual infectious or inflammatory process as seen on prior CT. Recommend continued follow-up to clearing. Electronically Signed   By: Inge Rise M.D.   On: 11/27/2020 11:36   DG Chest Portable 1 View  Result Date: 11/19/2020 CLINICAL DATA:  Cough, fever EXAM: PORTABLE CHEST 1 VIEW COMPARISON:  10/01/2020 FINDINGS: Heart size is upper limits of normal. Low lung volumes. Mildly increased interstitial markings bilaterally. No lobar consolidation. No pleural effusion or pneumothorax. IMPRESSION: Low lung volumes with mildly increased interstitial markings bilaterally may reflect mild edema or atypical/viral infection. Electronically Signed   By: Davina Poke D.O.   On: 11/19/2020 16:22   EEG adult  Result Date: 11/25/2020 Lora Havens, MD     11/25/2020  5:08 PM Patient Name: Tonya Myers MRN: [id number] Epilepsy Attending: Lora Havens Referring Physician/Provider: Dr Rosalin Hawking Date: 11/25/2020 Duration: 31.44 mins Patient history: 55yo F with ams. EEG to evaluate for seizure Level of alertness: Awake AEDs during EEG study: None Technical aspects: This EEG  study was done with scalp electrodes positioned according to the 10-20 International system of electrode placement. Electrical activity was acquired at a sampling rate of 500Hz and reviewed with a high frequency filter of 70Hz and a low frequency filter of 1Hz. EEG data were recorded continuously and digitally stored. Description: No posterior dominant rhythm was seen. EEG showed continuous generalized 3 to 6 Hz theta-delta slowing. Physiologic photic driving was not seen during photic stimulation.  Hyperventilation was not performed.   ABNORMALITY - Continuous  slow, generalized IMPRESSION: This study is suggestive of moderate diffuse encephalopathy, nonspecific etiology. No seizures or epileptiform discharges were seen throughout the recording. Priyanka O Yadav   [place] EKG SITE RITE  Result Date: 11/27/2020 If [facility] image not attached, placement could not be confirmed due to current cardiac rhythm.  DG FL GUIDED LUMBAR PUNCTURE  Result Date: 11/20/2020 CLINICAL DATA:  Altered mental status EXAM: DIAGNOSTIC LUMBAR PUNCTURE UNDER FLUOROSCOPIC GUIDANCE COMPARISON:  None FLUOROSCOPY TIME:  Fluoroscopy Time:  12 seconds Radiation Exposure Index (if provided by the fluoroscopic device): 1.6 mGy PROCEDURE: Informed consent was obtained from the patient's husband prior to the procedure, including potential complications of headache, allergy, and pain. With the patient prone, the lower back was prepped with Betadine. 1% Lidocaine was used for local anesthesia. Lumbar puncture was performed at the L4-L5 level using a 20 gauge needle with return of clear CSF. 8 ml of CSF were obtained for laboratory studies. The patient tolerated the procedure well and there were no apparent complications. IMPRESSION: Technically successful fluoroscopic guided lumbar puncture. Electronically Signed   By: Macy Mis M.D.   On: 11/20/2020 11:42    ASSESSMENT: Altered mental status/confusion, acute pancreatitis.  PLAN:    1.  Acute encephalopathy/altered mental status/confusion: Etiology remains unclear.  Infectious work-up appears to be negative. Highly unlikely related to underlying CLL given unimpressive lumbar fluid with negative cytology as well as negative head CT.  Also minimal if any disease present systemically.  Given patient's persistent pancytopenia and no other obvious etiologies, I have scheduled a bone marrow biopsy for later today. 2.  Pancytopenia: Chronic and unchanged.  Patient essentially had a normal CBC in June 2022.  Bone marrow biopsy today as above. 3.  CLL:  Patient has been in remission since 2016.  No overt systemic evidence of aggressive disease.  Repeat bone marrow biopsy.   Will follow.   Lloyd Huger, MD   12/04/2020 9:41 AM

## 2020-12-04 NOTE — Progress Notes (Signed)
PT Cancellation Note  Patient Details Name: Tonya Myers MRN: [id number] DOB: [birth year]   Cancelled Treatment:     Pt currently having bone marrow biopsy. Will return later this date and continue to follow closely throughout remainder of hospitalization. Pt will need extensive ongoing PT to progress to PLOF. Will return after lunch.    Willette Pa 12/04/2020, 10:56 AM

## 2020-12-04 NOTE — Progress Notes (Addendum)
[facility]  ROUNDING NOTE   Subjective:   Tonya Myers is a 55 y.o. female with medical conditions including pneumococcal meningitis bacteremia, hypothyroidism, Type 2 diabetes, and lymphoma in remission. She presents to the ED with confusion and is admitted for Altered mental status [R41.82] Encephalopathy acute [G93.40] Altered mental status, unspecified altered mental status type [R41.82] Sepsis with encephalopathy without septic shock, due to unspecified organism ([place]) [A41.9, R65.20, G93.40]  Patient is seen resting in bed Husband at bedside Currently NPO for procedure States she feels well today and asking about discharge plan   Objective:  Vital signs in last 24 hours:  Temp:  [98.7 F (37.1 C)-100.5 F (38.1 C)] 99.8 F (37.7 C) (08/11 0728) Pulse Rate:  [72-84] 75 (08/11 0728) Resp:  [16-22] 18 (08/11 0728) BP: (133-153)/(64-78) 141/75 (08/11 0728) SpO2:  [98 %-100 %] 99 % (08/11 0728)  Weight change:  Filed Weights   11/20/20 0507 12/01/20 0920 12/01/20 1730  Weight: 83 kg 94.9 kg 94.4 kg    Intake/Output: I/O last 3 completed shifts: In: 1593.7 [I.V.:12.8; Blood:746; IV Piggyback:834.9] Out: 094 [Urine:875]   Intake/Output this shift:  No intake/output data recorded.  Physical Exam: General: NAD, resting in bed  Head: Normocephalic, atraumatic. Moist oral mucosal membranes  Eyes: Anicteric  Lungs:  Clear to auscultation, normal effort  Heart: Regular rate and rhythm  Abdomen:  Soft, nontender  Extremities: trace peripheral edema.  Neurologic: Alert, moving all four extremities  Skin: No lesions       Basic Metabolic Panel: Recent Labs  Lab 11/28/20 0455 11/29/20 0419 11/30/20 0503 12/01/20 0620 12/02/20 0601 12/03/20 0521 12/04/20 0609  NA 136   < > 136 136 137 138 136  K 3.4*   < > 3.6 3.9 3.9 3.8 3.7  CL 108   < > 111 109 111 111 109  CO2 25   < > 23 20* 21* 22 20*  GLUCOSE 165*   < > 161* 148* 105* 88 79  BUN 12   <  > _0 CREATININE 1.35*   < > 1.54* 1.86* 2.06* 2.41* 2.66*  CALCIUM 7.5*   < > 7.3* 7.5* 7.8* 7.6* 7.7*  MG 1.6*   < > 2.2 1.8 2.0 1.8 1.7  PHOS 3.2  --   --   --   --   --   --    < > = values in this interval not displayed.     Liver Function Tests: Recent Labs  Lab 11/28/20 0455 11/30/20 0503 12/03/20 1140  AST 45* 58* 62*  ALT _1 ALKPHOS 71 74 75  BILITOT 0.4 0.5 0.6  PROT 5.3* 5.2* 5.0*  ALBUMIN 2.0* 1.9* 1.9*    Recent Labs  Lab 11/30/20 0503 12/01/20 0620 12/02/20 0601 12/03/20 0521 12/04/20 0609  LIPASE 306* 270* 227* 190* 149*    No results for input(s): AMMONIA in the last 168 hours.  CBC: Recent Labs  Lab 11/30/20 0503 12/01/20 0620 12/02/20 0601 12/03/20 0521 12/03/20 2118 12/04/20 0609  WBC 4.9 4.5 4.2 3.4*  --  3.6*  NEUTROABS  --   --  3.1 2.6  --  2.7  HGB 7.7* 7.5* 7.4* 6.5* 8.6* 7.8*  HCT 24.3* 22.3* 22.6* 19.7* 25.6* 23.4*  MCV 81.8 83.2 82.8 83.5  --  81.8  PLT 73* 65* 63* 52*  --  47*     Cardiac Enzymes: Recent Labs  Lab 12/03/20 1140  CKTOTAL 19*  BNP: Invalid input(s): POCBNP  CBG: Recent Labs  Lab 12/03/20 1203 12/03/20 1550 12/03/20 2042 12/04/20 0729 12/04/20 0752  GLUCAP 171* 114* 86 63* 100*     Microbiology: Results for orders placed or performed during the hospital encounter of 11/19/20  Blood culture (routine x 2)     Status: None   Collection Time: 11/19/20  4:47 PM   Specimen: BLOOD  Result Value Ref Range Status   Specimen Description BLOOD RIGHT ANTECUBITAL  Final   Special Requests   Final    BOTTLES DRAWN AEROBIC AND ANAEROBIC Blood Culture adequate volume   Culture   Final    NO GROWTH 5 DAYS Performed at [facility], [address] [place], [place] [address]    Report Status 11/24/2020 FINAL  Final  Resp Panel by RT-PCR (Flu A&B, Covid) Nasopharyngeal Swab     Status: None   Collection Time: 11/19/20  4:48 PM   Specimen: Nasopharyngeal Swab;  Nasopharyngeal(NP) swabs in vial transport medium  Result Value Ref Range Status   SARS Coronavirus 2 by RT PCR NEGATIVE NEGATIVE Final    Comment: (NOTE) SARS-CoV-2 target nucleic acids are NOT DETECTED.  The SARS-CoV-2 RNA is generally detectable in upper respiratory specimens during the acute phase of infection. The lowest concentration of SARS-CoV-2 viral copies this assay can detect is 138 copies/mL. A negative result does not preclude SARS-Cov-2 infection and should not be used as the sole basis for treatment or other patient management decisions. A negative result may occur with  improper specimen collection/handling, submission of specimen other than nasopharyngeal swab, presence of viral mutation(s) within the areas targeted by this assay, and inadequate number of viral copies(<138 copies/mL). A negative result must be combined with clinical observations, patient history, and epidemiological information. The expected result is Negative.  Fact Sheet for Patients:  EntrepreneurPulse.com.au  Fact Sheet for Healthcare Providers:  IncredibleEmployment.be  This test is no t yet approved or cleared by the [place] FDA and  has been authorized for detection and/or diagnosis of SARS-CoV-2 by FDA under an Emergency Use Authorization (EUA). This EUA will remain  in effect (meaning this test can be used) for the duration of the COVID-19 declaration under Section 564(b)(1) of the Act, 21 U.S.C.section 360bbb-3(b)(1), unless the authorization is terminated  or revoked sooner.       Influenza A by PCR NEGATIVE NEGATIVE Final   Influenza B by PCR NEGATIVE NEGATIVE Final    Comment: (NOTE) The Xpert Xpress SARS-CoV-2/FLU/RSV plus assay is intended as an aid in the diagnosis of influenza from Nasopharyngeal swab specimens and should not be used as a sole basis for treatment. Nasal washings and aspirates are unacceptable for Xpert Xpress  SARS-CoV-2/FLU/RSV testing.  Fact Sheet for Patients: EntrepreneurPulse.com.au  Fact Sheet for Healthcare Providers: IncredibleEmployment.be  This test is not yet approved or cleared by the [place] FDA and has been authorized for detection and/or diagnosis of SARS-CoV-2 by FDA under an Emergency Use Authorization (EUA). This EUA will remain in effect (meaning this test can be used) for the duration of the COVID-19 declaration under Section 564(b)(1) of the Act, 21 U.S.C. section 360bbb-3(b)(1), unless the authorization is terminated or revoked.  Performed at [facility], [address] [place],  [address]   Blood culture (routine x 2)     Status: None   Collection Time: 11/19/20  6:46 PM   Specimen: BLOOD  Result Value Ref Range Status   Specimen Description BLOOD RIGHT ANTECUBITAL  Final   Special Requests   Final    BOTTLES DRAWN AEROBIC AND ANAEROBIC Blood Culture adequate volume   Culture   Final    NO GROWTH 5 DAYS Performed at [facility], [address] [place], [place] [address]    Report Status 11/24/2020 FINAL  Final  Urine Culture     Status: Abnormal   Collection Time: 11/19/20  9:33 PM   Specimen: Urine, Clean Catch  Result Value Ref Range Status   Specimen Description   Final    URINE, CLEAN CATCH Performed at [facility], [address], [place], [place] [address]    Special Requests   Final    NONE Performed at [facility], [address], [place], [place] [address]    Culture (A)  Final    <10,000 COLONIES/mL INSIGNIFICANT GROWTH Performed at [facility], [address] [place], [place] [address]    Report Status 11/21/2020 FINAL  Final  Culture, fungus without smear     Status: None (Preliminary result)   Collection Time: 11/20/20 11:09 AM   Specimen: CSF; Cerebrospinal Fluid  Result Value Ref Range Status   Specimen Description   Final     CSF Performed at [facility], [address] [place], [place] [address]    Special Requests   Final    NONE Performed at [facility], [address], [place], [place] [address]    Culture   Final    NO FUNGUS ISOLATED AFTER 13 DAYS Performed at [facility], [place] [address], [place], [place] [address]    Report Status PENDING  Incomplete  CSF culture w Gram Stain     Status: None   Collection Time: 11/20/20 11:09 AM   Specimen: PATH Cytology CSF; Cerebrospinal Fluid  Result Value Ref Range Status   Specimen Description   Final    CSF Performed at [facility], [address] [place], [place] [address]    Special Requests   Final    NONE Performed at [facility], [address] [place], [place] [address]    Gram Stain   Final    NO ORGANISMS SEEN WBC SEEN RED BLOOD CELLS PRESENT Performed at [facility], [address] [place], [place] [address]    Culture   Final    NO GROWTH 3 DAYS Performed at [facility], [place] [address] [place], [place] [address]    Report Status 11/23/2020 FINAL  Final  Fungus Culture With Stain     Status: None (Preliminary result)   Collection Time: 11/20/20 11:09 AM   Specimen: PATH Cytology CSF; Cerebrospinal Fluid  Result Value Ref Range Status   Fungus Stain Final report  Final    Comment: (NOTE) Performed At: [facility] [place], [place] [id number] Rush Farmer MD BZ:[id number]    Fungus (Mycology) Culture PENDING  Incomplete   Fungal Source CSF  Final    Comment: Performed at [facility], [address] [place], [place] [address]  Fungus Culture Result     Status: None   Collection Time: 11/20/20 11:09 AM  Result Value Ref Range Status   Result 1 Comment  Final    Comment: (NOTE) KOH/Calcofluor preparation:  no fungus observed. Performed At: [facility] [place], [place] [id number] Rush Farmer MD  EU:[id number]   Resp Panel by RT-PCR (Flu A&B, Covid) Nasopharyngeal Swab     Status:  None   Collection Time: 11/27/20  1:39 PM   Specimen: Nasopharyngeal Swab; Nasopharyngeal(NP) swabs in vial transport medium  Result Value Ref Range Status   SARS Coronavirus 2 by RT PCR NEGATIVE NEGATIVE Final    Comment: (NOTE) SARS-CoV-2 target nucleic acids are NOT DETECTED.  The SARS-CoV-2 RNA is generally detectable in upper respiratory specimens during the acute phase of infection. The lowest concentration of SARS-CoV-2 viral copies this assay can detect is 138 copies/mL. A negative result does not preclude SARS-Cov-2 infection and should not be used as the sole basis for treatment or other patient management decisions. A negative result may occur with  improper specimen collection/handling, submission of specimen other than nasopharyngeal swab, presence of viral mutation(s) within the areas targeted by this assay, and inadequate number of viral copies(<138 copies/mL). A negative result must be combined with clinical observations, patient history, and epidemiological information. The expected result is Negative.  Fact Sheet for Patients:  EntrepreneurPulse.com.au  Fact Sheet for Healthcare Providers:  IncredibleEmployment.be  This test is no t yet approved or cleared by the [place] FDA and  has been authorized for detection and/or diagnosis of SARS-CoV-2 by FDA under an Emergency Use Authorization (EUA). This EUA will remain  in effect (meaning this test can be used) for the duration of the COVID-19 declaration under Section 564(b)(1) of the Act, 21 U.S.C.section 360bbb-3(b)(1), unless the authorization is terminated  or revoked sooner.       Influenza A by PCR NEGATIVE NEGATIVE Final   Influenza B by PCR NEGATIVE NEGATIVE Final    Comment: (NOTE) The Xpert Xpress SARS-CoV-2/FLU/RSV plus assay is intended as an aid in the diagnosis of  influenza from Nasopharyngeal swab specimens and should not be used as a sole basis for treatment. Nasal washings and aspirates are unacceptable for Xpert Xpress SARS-CoV-2/FLU/RSV testing.  Fact Sheet for Patients: EntrepreneurPulse.com.au  Fact Sheet for Healthcare Providers: IncredibleEmployment.be  This test is not yet approved or cleared by the [place] FDA and has been authorized for detection and/or diagnosis of SARS-CoV-2 by FDA under an Emergency Use Authorization (EUA). This EUA will remain in effect (meaning this test can be used) for the duration of the COVID-19 declaration under Section 564(b)(1) of the Act, 21 U.S.C. section 360bbb-3(b)(1), unless the authorization is terminated or revoked.  Performed at [facility], [address], [place], [place] [address]   Urine Culture     Status: Abnormal   Collection Time: 11/27/20  2:15 PM   Specimen: Urine, Clean Catch  Result Value Ref Range Status   Specimen Description   Final    URINE, CLEAN CATCH Performed at [facility], [address] [place], [place] [address]    Special Requests   Final    NONE Performed at [facility], [place], [place] [address]    Culture 20,000 COLONIES/mL ENTEROBACTER AEROGENES (A)  Final   Report Status 11/30/2020 FINAL  Final   Organism ID, Bacteria ENTEROBACTER AEROGENES (A)  Final      Susceptibility   Enterobacter aerogenes - MIC*    CEFAZOLIN >=64 RESISTANT Resistant     CEFEPIME 1 SENSITIVE Sensitive     CEFTRIAXONE >=64 RESISTANT Resistant     CIPROFLOXACIN <=0.25 SENSITIVE Sensitive     GENTAMICIN <=1 SENSITIVE Sensitive     IMIPENEM <=0.25 SENSITIVE Sensitive     NITROFURANTOIN 64 INTERMEDIATE Intermediate     TRIMETH/SULFA <=20 SENSITIVE Sensitive     PIP/TAZO >=128 RESISTANT Resistant     *  20,000 COLONIES/mL ENTEROBACTER AEROGENES  CULTURE, BLOOD (ROUTINE X 2) w Reflex to ID Panel      Status: None   Collection Time: 11/27/20  8:23 PM   Specimen: BLOOD  Result Value Ref Range Status   Specimen Description BLOOD LAC  Final   Special Requests   Final    BOTTLES DRAWN AEROBIC AND ANAEROBIC Blood Culture results may not be optimal due to an inadequate volume of blood received in culture bottles   Culture   Final    NO GROWTH 5 DAYS Performed at [facility], [address] [place], [place] [address]    Report Status 12/02/2020 FINAL  Final  CULTURE, BLOOD (ROUTINE X 2) w Reflex to ID Panel     Status: None   Collection Time: 11/27/20  9:32 PM   Specimen: BLOOD LEFT HAND  Result Value Ref Range Status   Specimen Description BLOOD LEFT HAND  Final   Special Requests   Final    BOTTLES DRAWN AEROBIC AND ANAEROBIC Blood Culture adequate volume   Culture   Final    NO GROWTH 5 DAYS Performed at [facility], [address] [place], [place] [address]    Report Status 12/02/2020 FINAL  Final    Coagulation Studies: No results for input(s): LABPROT, INR in the last 72 hours.  Urinalysis: Recent Labs    12/02/20 0600 12/03/20 1045  COLORURINE YELLOW* YELLOW*  LABSPEC 1.009 1.013  PHURINE 7.0 6.0  GLUCOSEU NEGATIVE NEGATIVE  HGBUR LARGE* LARGE*  BILIRUBINUR NEGATIVE NEGATIVE  KETONESUR NEGATIVE NEGATIVE  PROTEINUR 100* 30*  NITRITE NEGATIVE NEGATIVE  LEUKOCYTESUR NEGATIVE NEGATIVE       Imaging: No results found.   Medications:    doxycycline (VIBRAMYCIN) IV Stopped (12/04/20 0130)    Chlorhexidine Gluconate Cloth  6 each Topical Daily   dextrose  12.5 g Intravenous Once   diltiazem  120 mg Oral Daily   feeding supplement  1 Container Oral TID BM   heparin lock flush       hydrALAZINE  50 mg Oral Q8H   insulin aspart  0-15 Units Subcutaneous TID WC   insulin glargine-yfgn  8 Units Subcutaneous Daily   levothyroxine  100 mcg Oral QAC breakfast   magnesium oxide  800 mg Oral BID   mouth rinse  15 mL Mouth Rinse q12n4p    midazolam       mirtazapine  15 mg Oral QHS   multivitamin with minerals  1 tablet Oral Daily   pantoprazole  40 mg Oral Daily   rosuvastatin  10 mg Oral QHS   sodium chloride flush  10-40 mL Intracatheter Q12H   thiamine  100 mg Oral Daily   acetaminophen **OR** acetaminophen, albuterol, haloperidol lactate, ondansetron **OR** ondansetron (ZOFRAN) IV, polyethylene glycol, sodium chloride flush  Assessment/ Plan:  [name] is a 55 y.o.  female with medical conditions including pneumococcal meningitis bacteremia, hypothyroidism, Type 2 diabetes, and lymphoma in remission. She presents to the ED with confusion and is admitted for Altered mental status [R41.82] Encephalopathy acute [G93.40] Altered mental status, unspecified altered mental status type [R41.82] Sepsis with encephalopathy without septic shock, due to unspecified organism ([place]) [A41.9, R65.20, G93.40]   Acute Kidney Injury with baseline creatinine 0.49 on 11/22/20.  Acute kidney injury secondary to contrast induced ATN. IV contrast exposure on November 21, 2020.  UA shows protienuria, >50 RBCs, 21-50 WBC, microbiology 20k colonies- Enterobacter. Renal ultrasound negative for mass and obstruction. Questionable small urterocele.  No acute need for dialysis at this time Creatinine increased, UOP 8110m UA improved and protein creatinine ratio increased Pending testing and serologies: ANCA, ANA, C3, C4, Total complement, Kappa light, and SPEP. Discussed additional testing with patient and husband. Explained that these test will take a few days to result, but help uKoreadetermine if the concern is with the kidney or if there's other concerns that are effecting the kidney.  Strict intake and output Continue to avoid hypotension, ACE/ARBs and any nephrotoxic agents Will continue to monitor closely  Lab Results  Component Value Date   CREATININE 2.66 (H) 12/04/2020   CREATININE 2.41 (H) 12/03/2020   CREATININE 2.06 (H)  12/02/2020    Intake/Output Summary (Last 24 hours) at 12/04/2020 1025 Last data filed at 12/04/2020 0424 Gross per 24 hour  Intake 1593.73 ml  Output 475 ml  Net 1118.73 ml    2. Anemia  with history of CLL pancytopenia Lab Results  Component Value Date   HGB 7.8 (L) 12/04/2020  Transfused 1 unit RBC on 12/04/20 Bone marrow biopsy today    LOS: 1[place]le8/11/202210:25 AM

## 2020-12-04 NOTE — Progress Notes (Signed)
Physical Therapy Treatment Patient Details Name: Tonya Myers MRN: [id number] DOB: 1965/11/17 Today's Date: 12/04/2020    History of Present Illness Pt admitted to [facility] on 11/19/20 for decreased appetite, vomiting, AMS, and lethargy. On EMS assessment, pt was found to have fever 102 and AMS with confusion and left sided weakness.  Found to have pancytopenia, oncology was consulted due to hx of CLL, but so far no concern of CLL relapse. ID also on board, initially concerning for meningitis due to hx of pneumococcal meningitis. PMH significant for: DDD, cervical radiculopathy, anxiety, DM, HTN, hypothyroidism, CLL, asthma, cLBP, vertigo, and hx of stroke.    PT Comments    Pt was long sitting in bed upon arriving. PT/OT co-treat 2/2 to +2 required for safety and pt fatigues quickly. Continues to endorse having loose Bms. MD made aware and ordering medications. Pt was able to progress from long sitting to short sit EOB with min assist. Stood 2 x EOB and then took ~ 3 steps to [facility] form EOB. +2 assistance throughout for safety. She had successful BM in [facility] prior to standing and returning to EOB. PT recommend +2 assistance for all mobility. Pt lacks confidence at times which impacts her abilities. Once in standing, pt is able to perform well however struggles achieve standing at times. Recommend hoyer lift for days at home when unable to stand. Overall pt tolerated session well but limited by fatigue and loose stools.   Follow Up Recommendations  Home health PT;Supervision/Assistance - 24 hour;Supervision for mobility/OOB     Equipment Recommendations  Other (comment) (hoyer lift for safety. All other equipment needs met.)       Precautions / Restrictions Precautions Precautions: Fall Restrictions Weight Bearing Restrictions: No    Mobility  Bed Mobility Overal bed mobility: Needs Assistance Bed Mobility: Supine to Sit;Sit to Supine     Supine to sit: Min assist Sit to supine: Min  assist;Mod assist;HOB elevated   General bed mobility comments: increased tie requird to exit and re-enter bed. Min assist + use of bed rails to exit bed and min-mod to return to long sitting with assistance progressing LEs back into bed    Transfers Overall transfer level: Needs assistance Equipment used: Rolling walker (2 wheeled) Transfers: Sit to/from Stand Sit to Stand: +2 safety/equipment;Min assist;+2 physical assistance         General transfer comment: +2 assistance for safety. Stood to RW 2 x EOB then 2 x from [facility].  Ambulation/Gait Ambulation/Gait assistance: +2 safety/equipment;Min assist;Mod assist Gait Distance (Feet): 3 Feet Assistive device: Rolling walker (2 wheeled) Gait Pattern/deviations: Step-to pattern;Trunk flexed Gait velocity: decreased   General Gait Details: 3 steps from EOB to [facility]     Balance Overall balance assessment: Needs assistance Sitting-balance support: No upper extremity supported;Feet supported Sitting balance-Leahy Scale: Good     Standing balance support: Bilateral upper extremity supported;During functional activity Standing balance-Leahy Scale: Fair Standing balance comment: once in standing was able to static stand with close supervision    Cognition Arousal/Alertness: Awake/alert Behavior During Therapy: WFL for tasks assessed/performed Overall Cognitive Status: Within Functional Limits for tasks assessed      General Comments: Pt is A and O. A little frustrated and flat at times however willing and cooperative throughout             Pertinent Vitals/Pain Pain Assessment: No/denies pain Faces Pain Scale: No hurt Pain Intervention(s): Limited activity within patient's tolerance;Monitored during session;Repositioned     PT Goals (current goals can  now be found in the care plan section) Acute Rehab PT Goals Patient Stated Goal: to get stronger at home Progress towards PT goals: Progressing toward goals     Frequency    7X/week      PT Plan Current plan remains appropriate    Co-evaluation PT/OT/SLP Co-Evaluation/Treatment: Yes Reason for Co-Treatment: Complexity of the patient's impairments (multi-system involvement);For patient/therapist safety;Necessary to address cognition/behavior during functional activity;To address functional/ADL transfers PT goals addressed during session: Mobility/safety with mobility;Proper use of DME;Balance;Strengthening/ROM        AM-PAC PT "6 Clicks" Mobility   Outcome Measure  Help needed turning from your back to your side while in a flat bed without using bedrails?: A Little Help needed moving from lying on your back to sitting on the side of a flat bed without using bedrails?: A Little Help needed moving to and from a bed to a chair (including a wheelchair)?: A Little Help needed standing up from a chair using your arms (e.g., wheelchair or bedside chair)?: A Lot Help needed to walk in hospital room?: A Lot Help needed climbing 3-5 steps with a railing? : Total 6 Click Score: 14    End of Session Equipment Utilized During Treatment: Gait belt Activity Tolerance: Patient tolerated treatment well Patient left: in bed;with bed alarm set;with family/visitor present;with call bell/phone within reach Nurse Communication: Mobility status PT Visit Diagnosis: Unsteadiness on feet (R26.81);Muscle weakness (generalized) (M62.81);Difficulty in walking, not elsewhere classified (R26.2);Hemiplegia and hemiparesis Hemiplegia - Right/Left: Right     Time: 8721-5872 PT Time Calculation (min) (ACUTE ONLY): 53 min  Charges:  $Therapeutic Activity: 23-37 mins                    Julaine Fusi PTA 12/04/20, 4:32 PM

## 2020-12-04 NOTE — Progress Notes (Signed)
Patient clinically stable post BMB per Dr Vernard Gambles, tolerated well with local anesthetic along with Versed 2 mg IV for procedure. Denies complaints post procedure, awake/alert and oriented post procedure. Bedside report given to [facility] on 1 c post procedure/recovery.

## 2020-12-04 NOTE — Procedures (Signed)
  Procedure: CT bone marrow biopsy R iliac EBL:   minimal Complications:  none immediate  See full dictation in [facility] PACS.  D. Jerrine Urschel MD Main # [phone] Pager  [phone]    

## 2020-12-04 NOTE — Progress Notes (Signed)
ID Pt had bone marrow biopsy done by IR today under [facility]  Date 7/27 7/28 7/29 7/30 7/31 8/1 8/2 8/3  Tmax /- 102.6 98.8 98.9 100.4 99.5 99.9 99.5 103  antibiotic Cef,vanco,acyclovir CTX,VAN CTX/VAN CTX/Va      steroid Decadron Decadron Decadron Decadron      culture BLD-N CSF-N        Date 8/4 8/5 8/6 8/7 8/8 8/9 8/10 8/11  TMAX 98.6 99.7 102.3 101 100.7 100.6 102.1 100.2  ABX PICC   cipro Cipr doxy Cipr doxy doxy   Culture BLD-N Urine-EA            Patient Vitals for the past 24 hrs:  BP Temp Temp src Pulse Resp SpO2  12/04/20 1123 (!) 150/76 100.2 F (37.9 C) -- 79 18 98 %  12/04/20 1115 (!) 164/92 -- -- 86 20 100 %  12/04/20 1103 (!) 172/86 -- -- 84 (!) 21 100 %  12/04/20 1055 (!) 180/76 -- -- 85 (!) 22 100 %  12/04/20 1051 (!) 161/82 -- -- 85 (!) 22 100 %  12/04/20 1046 (!) 162/79 -- -- 86 (!) 23 100 %  12/04/20 1032 (!) 170/80 -- -- 81 16 98 %  12/04/20 0728 (!) 141/75 99.8 F (37.7 C) -- 75 18 99 %  12/04/20 0419 (!) 142/74 98.7 F (37.1 C) Oral 72 18 98 %  12/03/20 2359 (!) 153/78 99.4 F (37.4 C) -- 75 18 100 %  12/03/20 1958 134/71 99.4 F (37.4 C) -- 84 18 98 %  12/03/20 1711 (!) 142/73 99.5 F (37.5 C) -- 76 16 100 %  12/03/20 1709 (!) 142/73 99.5 F (37.5 C) -- 76 16 --  12/03/20 1645 133/76 (!) 100.5 F (38.1 C) Oral 75 17 99 %    Pt sleeping , but on calling she woke up Says she feels okay Chest b/l air entry [id number] Abd soft Rt PICC  labs    CBC Latest Ref Rng & Units 12/04/2020 12/03/2020 12/03/2020  WBC 4.0 - 10.5 K/uL 3.6(L) - 3.4(L)  Hemoglobin 12.0 - 15.0 g/dL 7.8(L) 8.6(L) 6.5(L)  Hematocrit 36.0 - 46.0 % 23.4(L) 25.6(L) 19.7(L)  Platelets 150 - 400 K/uL 47(L) - 52(L)     CMP Latest Ref Rng & Units 12/04/2020 12/03/2020 12/02/2020  Glucose 70 - 99 mg/dL 79 88 105(H)  BUN 6 - 20 mg/dL _0 Creatinine 0.44 - 1.00 mg/dL 2.66(H) 2.41(H) 2.06(H)  Sodium 135 - 145 mmol/L 136 138 137  Potassium 3.5 - 5.1 mmol/L 3.7 3.8 3.9  Chloride 98 -  111 mmol/L 109 111 111  CO2 22 - 32 mmol/L 20(L) 22 21(L)  Calcium 8.9 - 10.3 mg/dL 7.7(L) 7.6(L) 7.8(L)  Total Protein 6.5 - 8.1 g/dL - 5.0(L) -  Total Bilirubin 0.3 - 1.2 mg/dL - 0.6 -  Alkaline Phos 38 - 126 U/L - 75 -  AST 15 - 41 U/L - 62(H) -  ALT 0 - 44 U/L - 12 -    11/19/2020 blood culture no growth 11/20/2020 CSF culture no growth HSV PCR in CSF nonreactive VDRL nonreactive Crypto antigen nonreactive 11/12/2020 meningitis encephalitis panel nonreactive. 11/27/2020 urine culture Enterobacter serologies 11/27/2020 blood culture no growth  Ultrasound kidney- no hydro Intraluminal cystic focus posterior RIGHT bladder, favor representing a small RIGHT ureterocele.  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

## 2020-12-04 NOTE — Progress Notes (Signed)
Occupational Therapy Treatment Patient Details Name: Tonya Myers MRN: [id number] DOB: 02-18-1966 Today's Date: 12/04/2020    History of present illness Pt admitted to [facility] on 11/19/20 for decreased appetite, vomiting, AMS, and lethargy. On EMS assessment, pt was found to have fever 102 and AMS with confusion and left sided weakness.  Found to have pancytopenia, oncology was consulted due to hx of CLL, but so far no concern of CLL relapse. ID also on board, initially concerning for meningitis due to hx of pneumococcal meningitis. PMH significant for: DDD, cervical radiculopathy, anxiety, DM, HTN, hypothyroidism, CLL, asthma, cLBP, vertigo, and hx of stroke.   OT comments  Pt seen for OT Tx this date to f/u re: Safety with ADLs/ADL mobility. Pt willing to get OOB with encouragement. Pt seen for PT/OT co-tx this date to maximize benefits from services d/t low fxl activity tolerance. Pt able to CTS x3 trials during session including commode transfer. Pt noted to still be having mostly liquid BM and RN notified. OT engages pt in seated UB bathing/dressing with MIN A, LB bathing and toileting with MAX A +2, MIN/MOD A +3 from elevated surface for commode transfer. LB dressing with MIN A in sitting using FWD lean technique wiht CGA for safety with dynamic nature of task. Pt returned to bed at ned of session with MOD A> Left with all needs met and in reach. Will continue to follow. Continue to anticipate that pt will require [name] f/u services, but pt has made it clear she is wanting to go home at end of hospital stay despite needing 2p assist at this time for most ADLs/ADL mobility.    Follow Up Recommendations  [name]    Equipment Recommendations  Other (comment) (defer)    Recommendations for Other Services      Precautions / Restrictions Precautions Precautions: Fall Restrictions Weight Bearing Restrictions: No       Mobility Bed Mobility Overal bed mobility: Needs Assistance Bed Mobility:  Supine to Sit;Sit to Supine     Supine to sit: Min assist;HOB elevated Sit to supine: Min assist;Mod assist;HOB elevated   General bed mobility comments: increased tie requird to exit and re-enter bed. Min assist + use of bed rails to exit bed and min-mod to return to long sitting with assistance progressing LEs back into bed    Transfers Overall transfer level: Needs assistance Equipment used: Rolling walker (2 wheeled) Transfers: Sit to/from Stand Sit to Stand: +2 safety/equipment;+2 physical assistance;Min assist;Mod assist         General transfer comment: +2 assistance for safety. Stood to RW 2 x EOB then 2 x from [facility]. Pt's ability to assist waxed and waned some during session, seemingly surrounding confidence and sequence of leaning forward to gain momentum.    Balance Overall balance assessment: Needs assistance Sitting-balance support: No upper extremity supported;Feet supported Sitting balance-Leahy Scale: Good Sitting balance - Comments: G static sitting, F dynamic for LB ADLs including donning shoes, requires at least SBA/CGA For balance with low reaching   Standing balance support: Bilateral upper extremity supported;During functional activity Standing balance-Leahy Scale: Poor Standing balance comment: Requires MIN/MOD A at least to sustain static stand with RW for B UE Support                           ADL either performed or assessed with clinical judgement   ADL Overall ADL's : Needs assistance/impaired     Grooming: Wash/dry hands;Set up;Minimal assistance;Sitting  Grooming Details (indicate cue type and reason): d/t increased edema of R hand Upper Body Bathing: Minimal assistance;Moderate assistance;Sitting   Lower Body Bathing: Maximal assistance;Total assistance;+2 for physical assistance;+2 for safety/equipment;Sit to/from stand Lower Body Bathing Details (indicate cue type and reason): MAX A for posterior LB bathign as well as MAX A PT assist  to help pt maintain static stand with RW Upper Body Dressing : Minimal assistance;Sitting   Lower Body Dressing: Minimal assistance;Sitting/lateral leans Lower Body Dressing Details (indicate cue type and reason): to don shoes, pt able to slip her foot into them well, btu requires increased time and assist to manage laces. Toilet Transfer: Maximal assistance;+2 for physical assistance;+2 for safety/equipment;BSC;RW   Toileting- Clothing Manipulation and Hygiene: Maximal assistance;+2 for physical assistance;+2 for safety/equipment;Sit to/from stand Toileting - Clothing Manipulation Details (indicate cue type and reason): pt able to perform some posterior peri care after BM in sitting with sit/lateral lean technique, but is unable to keep her hand clean in the process. Ultimately, pt requires MAX A from PT to sustain static stand with RW and MAX A from OT to complete posterior peri care thoroughly as well as manage brief.             Vision Patient Visual Report: No change from baseline     Perception     Praxis      Cognition Arousal/Alertness: Awake/alert Behavior During Therapy: WFL for tasks assessed/performed;Flat affect Overall Cognitive Status: Within Functional Limits for tasks assessed                                 General Comments: Pt is A and O. A little frustrated and flat at times however willing and cooperative throughout        Exercises Other Exercises Other Exercises: completed seated UB bathing/dressing with MIN A, LB bathing and toileting with MAX A +2, MIN/MOD A +3 from elevated surface for commode transfer. LB dressing with MIN A in sitting using FWD lean technique wiht CGA for safety with dynamic nature of task.   Shoulder Instructions       General Comments      Pertinent Vitals/ Pain       Pain Assessment: No/denies pain Faces Pain Scale: No hurt Pain Intervention(s): Limited activity within patient's tolerance;Monitored during  session;Repositioned  Home Living                                          Prior Functioning/Environment              Frequency  Min 3X/week        Progress Toward Goals  OT Goals(current goals can now be found in the care plan section)  Progress towards OT goals: Progressing toward goals  Acute Rehab OT Goals Patient Stated Goal: to get stronger at home OT Goal Formulation: With patient/family Time For Goal Achievement: 12/10/20 Potential to Achieve Goals: Good  Plan Discharge plan remains appropriate;Frequency remains appropriate    Co-evaluation    PT/OT/SLP Co-Evaluation/Treatment: Yes Reason for Co-Treatment: Complexity of the patient's impairments (multi-system involvement);To address functional/ADL transfers;For patient/therapist safety PT goals addressed during session: Mobility/safety with mobility;Proper use of DME;Strengthening/ROM OT goals addressed during session: ADL's and self-care      AM-PAC OT "6 Clicks" Daily Activity     Outcome Measure  Help from another person eating meals?: A Little Help from another person taking care of personal grooming?: A Little Help from another person toileting, which includes using toliet, bedpan, or urinal?: A Lot Help from another person bathing (including washing, rinsing, drying)?: A Lot Help from another person to put on and taking off regular upper body clothing?: A Little Help from another person to put on and taking off regular lower body clothing?: A Lot 6 Click Score: 15    End of Session Equipment Utilized During Treatment: Gait belt;Rolling walker  OT Visit Diagnosis: Muscle weakness (generalized) (M62.81);Other symptoms and signs involving cognitive function   Activity Tolerance Patient tolerated treatment well   Patient Left in bed;with call bell/phone within reach;with bed alarm set;with family/visitor present   Nurse Communication Mobility status        Time:  1447-1540 OT Time Calculation (min): 53 min  Charges: OT General Charges $OT Visit: 1 Visit OT Treatments $Self Care/Home Management : 23-37 mins   Gerrianne Scale, [place], OTR/L ascom [phone] 12/04/20, 5:57 PM

## 2020-12-05 ENCOUNTER — Inpatient Hospital Stay
Admit: 2020-12-05 | Discharge: 2020-12-05 | Disposition: A | Discharge status: Home / Self Care | Payer: Medicare Other | Attending: Infectious Diseases | Admitting: Infectious Diseases

## 2020-12-05 DIAGNOSIS — N179 Acute kidney failure, unspecified: Secondary | ICD-10-CM

## 2020-12-05 DIAGNOSIS — R509 Fever, unspecified: Secondary | ICD-10-CM | POA: Diagnosis not present

## 2020-12-05 LAB — ENA+DNA/DS+ANTICH+CENTRO+JO...
Anti JO-1: <0.2 AI (ref 0.0–0.9)
Centromere Ab Screen: <0.2 AI (ref 0.0–0.9)
Chromatin Ab SerPl-aCnc: >8 AI — ABNORMAL HIGH (ref 0.0–0.9)
ENA SM Ab Ser-aCnc: >8 AI — ABNORMAL HIGH (ref 0.0–0.9)
Ribonucleic Protein: 1.7 AI — ABNORMAL HIGH (ref 0.0–0.9)
SSA (Ro) (ENA) Antibody, IgG: 4 AI — ABNORMAL HIGH (ref 0.0–0.9)
SSB (La) (ENA) Antibody, IgG: <0.2 AI (ref 0.0–0.9)
Scleroderma (Scl-70) (ENA) Antibody, IgG: <0.2 AI (ref 0.0–0.9)
ds DNA Ab: 11 IU/mL — ABNORMAL HIGH (ref 0–9)

## 2020-12-05 LAB — BASIC METABOLIC PANEL
Anion gap: 5 (ref 5–15)
BUN: 23 mg/dL — ABNORMAL HIGH (ref 6–20)
CO2: 20 mmol/L — ABNORMAL LOW (ref 22–32)
Calcium: 7.7 mg/dL — ABNORMAL LOW (ref 8.9–10.3)
Chloride: 113 mmol/L — ABNORMAL HIGH (ref 98–111)
Creatinine, Ser: 2.88 mg/dL — ABNORMAL HIGH (ref 0.44–1.00)
GFR, Estimated: 19 mL/min — ABNORMAL LOW (ref >60)
Glucose, Bld: 64 mg/dL — ABNORMAL LOW (ref 70–99)
Potassium: 3.6 mmol/L (ref 3.5–5.1)
Sodium: 138 mmol/L (ref 135–145)

## 2020-12-05 LAB — CBC WITH DIFFERENTIAL/PLATELET
Abs Immature Granulocytes: 0 10*3/uL (ref 0.00–0.07)
Basophils Absolute: 0 10*3/uL (ref 0.0–0.1)
Basophils Relative: 1 %
Eosinophils Absolute: 0.1 10*3/uL (ref 0.0–0.5)
Eosinophils Relative: 3 %
HCT: 22.8 % — ABNORMAL LOW (ref 36.0–46.0)
Hemoglobin: 7.6 g/dL — ABNORMAL LOW (ref 12.0–15.0)
Immature Granulocytes: 0 %
Lymphocytes Relative: 29 %
Lymphs Abs: 0.9 10*3/uL (ref 0.7–4.0)
MCH: 27.3 pg (ref 26.0–34.0)
MCHC: 33.3 g/dL (ref 30.0–36.0)
MCV: 82 fL (ref 80.0–100.0)
Monocytes Absolute: 0.1 10*3/uL (ref 0.1–1.0)
Monocytes Relative: 4 %
Neutro Abs: 1.9 10*3/uL (ref 1.7–7.7)
Neutrophils Relative %: 63 %
Platelets: 43 10*3/uL — ABNORMAL LOW (ref 150–400)
RBC: 2.78 MIL/uL — ABNORMAL LOW (ref 3.87–5.11)
RDW: 17.2 % — ABNORMAL HIGH (ref 11.5–15.5)
Smear Review: NORMAL
WBC: 3 10*3/uL — ABNORMAL LOW (ref 4.0–10.5)
nRBC: 0 % (ref 0.0–0.2)

## 2020-12-05 LAB — PROTEIN ELECTROPHORESIS, SERUM
A/G Ratio: 0.8 (ref 0.7–1.7)
Albumin ELP: 2.1 g/dL — ABNORMAL LOW (ref 2.9–4.4)
Alpha-1-Globulin: 0.2 g/dL (ref 0.0–0.4)
Alpha-2-Globulin: 0.5 g/dL (ref 0.4–1.0)
Beta Globulin: 0.6 g/dL — ABNORMAL LOW (ref 0.7–1.3)
Gamma Globulin: 1.4 g/dL (ref 0.4–1.8)
Globulin, Total: 2.6 g/dL (ref 2.2–3.9)
Total Protein ELP: 4.7 g/dL — ABNORMAL LOW (ref 6.0–8.5)

## 2020-12-05 LAB — ECHOCARDIOGRAM COMPLETE
AR max vel: 2.29 cm2
AV Area VTI: 2.59 cm2
AV Area mean vel: 2.48 cm2
AV Mean grad: 7 mmHg
AV Peak grad: 12.5 mmHg
Ao pk vel: 1.77 m/s
Area-P 1/2: 2.74 cm2
Height: 72 in
MV VTI: 3.16 cm2
S' Lateral: 3.33 cm
Weight: 3329.83 [oz_av]

## 2020-12-05 LAB — GLUCOSE, CAPILLARY
Glucose-Capillary: 60 mg/dL — ABNORMAL LOW (ref 70–99)
Glucose-Capillary: 119 mg/dL — ABNORMAL HIGH (ref 70–99)
Glucose-Capillary: 144 mg/dL — ABNORMAL HIGH (ref 70–99)
Glucose-Capillary: 88 mg/dL (ref 70–99)
Glucose-Capillary: 116 mg/dL — ABNORMAL HIGH (ref 70–99)

## 2020-12-05 LAB — LIPASE, BLOOD: Lipase: 130 U/L — ABNORMAL HIGH (ref 11–51)

## 2020-12-05 LAB — ANA W/REFLEX IF POSITIVE: Anti Nuclear Antibody (ANA): POSITIVE — AB

## 2020-12-05 LAB — ROCKY MTN SPOTTED FVR ABS PNL(IGG+IGM)
RMSF IgG: NEGATIVE
RMSF IgM: 0.41 index (ref 0.00–0.89)

## 2020-12-05 LAB — COMPLEMENT, TOTAL: Compl, Total (CH50): 11 U/mL — ABNORMAL LOW (ref >41)

## 2020-12-05 LAB — MAGNESIUM: Magnesium: 1.7 mg/dL (ref 1.7–2.4)

## 2020-12-05 LAB — GLOMERULAR BASEMENT MEMBRANE ANTIBODIES: GBM Ab: <0.2 units (ref 0.0–0.9)

## 2020-12-05 LAB — ANCA TITERS
Atypical P-ANCA titer: <1:20 {titer}
C-ANCA: <1:20 {titer}
P-ANCA: 1:160 {titer} — ABNORMAL HIGH

## 2020-12-05 LAB — C-REACTIVE PROTEIN: CRP: 1.7 mg/dL — ABNORMAL HIGH (ref <1.0)

## 2020-12-05 MED ORDER — SODIUM CHLORIDE 0.9 % IV SOLN
1000.0000 mg | Freq: Every day | INTRAVENOUS | Status: AC
  Administered 2020-12-05 – 2020-12-07 (×3): 1000 mg via INTRAVENOUS
  Filled 2020-12-05 (×3): qty 8

## 2020-12-05 MED ORDER — INSULIN GLARGINE-YFGN 100 UNIT/ML ~~LOC~~ SOLN
5.0000 [IU] | Freq: Every day | SUBCUTANEOUS | Status: DC
  Administered 2020-12-05 – 2020-12-06 (×2): 5 [IU] via SUBCUTANEOUS
  Filled 2020-12-05 (×4): qty 0.05

## 2020-12-05 NOTE — Progress Notes (Signed)
   12/05/20 1139  Assess: MEWS Score  Temp (!) 101.8 F (38.8 C)  BP 128/67  Pulse Rate 80  Resp 18  SpO2 100 %  Assess: MEWS Score  MEWS Temp 2  MEWS Systolic 0  MEWS Pulse 0  MEWS RR 0  MEWS LOC 0  MEWS Score 2  MEWS Score Color Yellow  Assess: if the MEWS score is Yellow or Red  Were vital signs taken at a resting state? Yes  Focused Assessment Change from prior assessment (see assessment flowsheet)  Does the patient meet 2 or more of the SIRS criteria? No  Does the patient have a confirmed or suspected source of infection? Yes  Provider and Rapid Response Notified? Yes  MEWS guidelines implemented *See Row Information* Yes  Treat  Pain Scale 0-10  Pain Score 0  Take Vital Signs  Increase Vital Sign Frequency  Yellow: Q 2hr X 2 then Q 4hr X 2, if remains yellow, continue Q 4hrs  Escalate  MEWS: Escalate Yellow: discuss with charge nurse/RN and consider discussing with provider and RRT  Notify: Charge Nurse/RN  Name of Charge Nurse/RN Notified Debi Pinkerton RN  Date Charge Nurse/RN Notified 12/05/20  Time Charge Nurse/RN Notified 1229  Notify: Provider  Provider Name/Title Dr Priscella Mann  Date Provider Notified 12/05/20  Time Provider Notified 1231  Notification Type Page  Notification Reason Change in status  Provider response No new orders  Date of Provider Response 12/05/20  Time of Provider Response 1231  Document  Patient Outcome Stabilized after interventions (continue to monitor)  Progress note created (see row info) Yes  Assess: SIRS CRITERIA  SIRS Temperature  1  SIRS Pulse 0  SIRS Respirations  0  SIRS WBC 1  SIRS Score Sum  2

## 2020-12-05 NOTE — Progress Notes (Signed)
OJ given for BS 60, will recheck

## 2020-12-05 NOTE — Progress Notes (Signed)
Physical Therapy Treatment Patient Details Name: Tonya Myers MRN: [id number] DOB: [birth year] Today's Date: 12/05/2020    History of Present Illness Pt admitted to [facility] on 11/19/20 for decreased appetite, vomiting, AMS, and lethargy. On EMS assessment, pt was found to have fever 102 and AMS with confusion and left sided weakness.  Found to have pancytopenia, oncology was consulted due to hx of CLL, but so far no concern of CLL relapse. ID also on board, initially concerning for meningitis due to hx of pneumococcal meningitis. PMH significant for: DDD, cervical radiculopathy, anxiety, DM, HTN, hypothyroidism, CLL, asthma, cLBP, vertigo, and hx of stroke.    PT Comments    Pt was side lying in bed with supportive friend at bedside. Pt has fever but is willing to participate. Pt reports active diarrhea much improved. She continues to require +2 assistance for safety. +2 mostly for sit to stands. Once in standing, pt is able to ambulate with +1 assist with w/c follow for safety. She performed STS 3 x throughout session. Would benefit from sit to stand lift at home at DC for times when unable to stand without +1 assist. She has 24/7 assistance and all other equipment needs met. Pt did progress today to ambulation to doorway of room ~ 15 ft. Took prolonged seated rest prior to standing with +2 assist from w/c then walking back to bed with min assist of one. Pt most limited by fatigue but HR and sao2 stable throughout. Overall pt is progressing and will continue to benefit from skilled PT going forward to progress to PLOF.    Follow Up Recommendations  Other (comment);Home health PT;Supervision/Assistance - 24 hour ([name] appropriate however pt unwilling. Has all equipment needs except lift + does have 24/7 assistance at home)     Equipment Recommendations  Other (comment) (sabina lift/sara lift/ sit to stand lift. pt has all other equipment needs met)       Precautions / Restrictions  Precautions Precautions: Fall Restrictions Weight Bearing Restrictions: No    Mobility  Bed Mobility Overal bed mobility: Needs Assistance Bed Mobility: Supine to Sit;Sit to Supine Rolling: Min assist   Supine to sit: Min assist;HOB elevated Sit to supine: Min assist;Mod assist   General bed mobility comments: Pt continues to required min assist to exit bed and min-mod to progress BLEs back into bed after OOB activity    Transfers Overall transfer level: Needs assistance Equipment used: Rolling walker (2 wheeled) Transfers: Sit to/from Stand Sit to Stand: +2 physical assistance;+2 safety/equipment;Min assist         General transfer comment: Min assist +2 to STS from w/c and low bed height. Pt requires +2 assist for safety. continues to present with weakness in BLEs/UEs with issues with dexterity  Ambulation/Gait Ambulation/Gait assistance: +2 safety/equipment;Min assist Gait Distance (Feet): 15 Feet Assistive device: Rolling walker (2 wheeled);None (w/c follow) Gait Pattern/deviations: Step-to pattern;Trunk flexed Gait velocity: pt was able to ambulate 2 x 15 ft. To doorway and back 2 x with use of RW. w/c follwo for safety with only +1 min assist.       Balance Overall balance assessment: Needs assistance Sitting-balance support: No upper extremity supported;Feet supported Sitting balance-Leahy Scale: Good Sitting balance - Comments: no LOB sitting EOB with feet support   Standing balance support: Bilateral upper extremity supported;During functional activity Standing balance-Leahy Scale: Fair Standing balance comment: pt is reliant on BUE support for balance in all standing task. she fatigues quickly  Cognition Arousal/Alertness: Awake/alert Behavior During Therapy: WFL for tasks assessed/performed;Flat affect Overall Cognitive Status: Within Functional Limits for tasks assessed        General Comments: Pt is A and O x 4. Requires increasd time to  perform all task and has her preferences on how to perform         General Comments General comments (skin integrity, edema, etc.): reviewed exercises to promote return in function and exterity of hands. Pt's friend/cargiver present and will continue to assist pt after DC when she returns home.      Pertinent Vitals/Pain Pain Assessment: No/denies pain Faces Pain Scale: No hurt Pain Intervention(s): Monitored during session;Limited activity within patient's tolerance     PT Goals (current goals can now be found in the care plan section) Acute Rehab PT Goals Patient Stated Goal: to get stronger at home Progress towards PT goals: Progressing toward goals    Frequency    7X/week      PT Plan Current plan remains appropriate    Co-evaluation     PT goals addressed during session: Mobility/safety with mobility;Balance;Proper use of DME;Strengthening/ROM        AM-PAC PT "6 Clicks" Mobility   Outcome Measure  Help needed turning from your back to your side while in a flat bed without using bedrails?: A Little Help needed moving from lying on your back to sitting on the side of a flat bed without using bedrails?: A Little Help needed moving to and from a bed to a chair (including a wheelchair)?: A Lot Help needed standing up from a chair using your arms (e.g., wheelchair or bedside chair)?: A Lot Help needed to walk in hospital room?: A Little Help needed climbing 3-5 steps with a railing? : A Lot 6 Click Score: 15    End of Session Equipment Utilized During Treatment: Gait belt Activity Tolerance: Patient tolerated treatment well Patient left: in bed;with bed alarm set;with family/visitor present;with call bell/phone within reach Nurse Communication: Mobility status PT Visit Diagnosis: Unsteadiness on feet (R26.81);Muscle weakness (generalized) (M62.81);Difficulty in walking, not elsewhere classified (R26.2);Hemiplegia and hemiparesis Hemiplegia - Right/Left: Right      Time: 1749-4496 PT Time Calculation (min) (ACUTE ONLY): 24 min  Charges:  $Gait Training: 8-22 mins $Neuromuscular Re-education: 8-22 mins                     Julaine Fusi PTA 12/05/20, 2:44 PM

## 2020-12-05 NOTE — Progress Notes (Signed)
*  PRELIMINARY RESULTS* Echocardiogram 2D Echocardiogram has been performed.  Tonya Myers 12/05/2020, 11:26 AM

## 2020-12-05 NOTE — Progress Notes (Signed)
ID   Date 7/27 7/28 7/29 7/30 7/31 8/1 8/2 8/3  Tmax /- 102.6 98.8 98.9 100.4 99.5 99.9 99.5 103  antibiotic Cef,vanco,acyclovir CTX,VAN CTX/VAN CTX/Va      steroid Decadron Decadron Decadron Decadron      culture BLD-N CSF-N        Date 8/4 8/5 8/6 8/7 8/8 8/9 8/10 8/11  TMAX 98.6 99.7 102.3 101 100.7 100.6 102.1 100.2  ABX PICC   cipro Cipr doxy Cipr doxy doxy   Culture BLD-N Urine-EA         DATE 12/05/20         TMAX 101.8         ABX          culture           Patient says she is feeling okay She does not feel the temperature. No cough or shortness of breath No pain abdomen She feels weak  Patient Vitals for the past 24 hrs:  BP Temp Temp src Pulse Resp SpO2  12/05/20 1633 122/63 -- -- 79 20 99 %  12/05/20 1500 -- 99.9 F (37.7 C) -- -- -- --  12/05/20 1405 118/65 (!) 100.6 F (38.1 C) Oral 78 18 98 %  12/05/20 1139 128/67 (!) 101.8 F (38.8 C) Oral 80 18 100 %  12/05/20 0749 136/71 99.6 F (37.6 C) -- 78 18 97 %  12/05/20 0501 140/76 99.6 F (37.6 C) -- 78 16 98 %  12/05/20 0148 -- (!) 100.7 F (38.2 C) Oral -- -- --  12/05/20 0006 (!) 153/75 (!) 101 F (38.3 C) -- 85 16 96 %  12/04/20 2014 (!) 144/76 100.1 F (37.8 C) -- 73 16 97 %    Awake and alert Chest bilateral air entry Heart sound S1-S2 Abdomen soft Right PICC line CNS decreased power on the left side But overall weak   CBC Latest Ref Rng & Units 12/05/2020 12/04/2020 12/03/2020  WBC 4.0 - 10.5 K/uL 3.0(L) 3.6(L) -  Hemoglobin 12.0 - 15.0 g/dL 7.6(L) 7.8(L) 8.6(L)  Hematocrit 36.0 - 46.0 % 22.8(L) 23.4(L) 25.6(L)  Platelets 150 - 400 K/uL 43(L) 47(L) -     CMP Latest Ref Rng & Units 12/05/2020 12/04/2020 12/03/2020  Glucose 70 - 99 mg/dL 64(L) 79 88  BUN 6 - 20 mg/dL 23(H) 19 19  Creatinine 0.44 - 1.00 mg/dL 2.88(H) 2.66(H) 2.41(H)  Sodium 135 - 145 mmol/L 138 136 138  Potassium 3.5 - 5.1 mmol/L 3.6 3.7 3.8  Chloride 98 - 111 mmol/L 113(H) 109 111  CO2 22 - 32 mmol/L 20(L) 20(L) 22  Calcium  8.9 - 10.3 mg/dL 7.7(L) 7.7(L) 7.6(L)  Total Protein 6.5 - 8.1 g/dL - - 5.0(L)  Total Bilirubin 0.3 - 1.2 mg/dL - - 0.6  Alkaline Phos 38 - 126 U/L - - 75  AST 15 - 41 U/L - - 62(H)  ALT 0 - 44 U/L - - 12    11/19/2020 blood culture no growth 11/20/2020 CSF culture no growth HSV PCR in CSF nonreactive VDRL nonreactive Crypto antigen nonreactive 11/12/2020 meningitis encephalitis panel nonreactive. 11/27/2020 urine culture Enterobacter serologies 11/27/2020 blood culture no growth  Ultrasound kidney- no hydro Intraluminal cystic focus posterior RIGHT bladder, favor representing a small RIGHT ureterocele.  2d echo-  Impression/recommendation Encephalopathy on admission- mild neutrophilic pleocytosis ( 50 wbc) and increased protein in CSF-meningits/encephalitis PCR panel negative- Infection ruled out Cytology was neg as there was a concern for CLL recurrence Encephalopathy resolved --she was  started on thiamine as well  Pancreatitis was an incidental finding on CT and then patient c/o mild pain That was questioned to be the cause for encephalopathy  AKI  --a few days into admission- thought to be contrast induced- very low c3/c4 level- ANA positive   Intermittent fever for the past 7-10 days-FUO MRI brain/spine Normal- no residual infection Blood culture neg Had enterobacter aerogenes in urine. No pyelonephritis, had a ureterocele and got 3 days of PO cipro but no response Cortisol and TSH normal Was also empirically treated with DOXY for possible tick related illness but no response even after 3 days and hence stopped today  Pancytopenia since admission This with fever and preceding weight loss makes CLL relapse high in the list  she had bone marrow biopsy today 2 d echo done- result pending (to look for any vegetation)  Also need to image the pancreas to see whether there is any pseudocyst or abscess or necrosis   Recent h/o disseminated pneumococcal infection in feb 2022  causing meningitis, spine abscess- resolved with IV antibiotics and surgical intervention Pt needs Prevnar 20 vaccine  DM  Discussed the management with the patient and hospitalist  ID will follow her peripherally this weekend call if needed

## 2020-12-05 NOTE — Progress Notes (Signed)
[facility]  ROUNDING NOTE   Subjective:   Tonya Myers is a 55 y.o. female with medical conditions including pneumococcal meningitis bacteremia, hypothyroidism, Type 2 diabetes, and lymphoma in remission. She presents to the ED with confusion and is admitted for Altered mental status [R41.82] Encephalopathy acute [G93.40] Altered mental status, unspecified altered mental status type [R41.82] Sepsis with encephalopathy without septic shock, due to unspecified organism ([place]) [A41.9, R65.20, G93.40]  Patient is seen resting in bed Husband at bedside Complaining of diarrhea   Objective:  Vital signs in last 24 hours:  Temp:  [99.4 F (37.4 C)-101.8 F (38.8 C)] 101.8 F (38.8 C) (08/12 1139) Pulse Rate:  [73-86] 80 (08/12 1139) Resp:  [16-18] 18 (08/12 1139) BP: (113-153)/(58-76) 128/67 (08/12 1139) SpO2:  [96 %-100 %] 100 % (08/12 1139)  Weight change:  Filed Weights   11/20/20 0507 12/01/20 0920 12/01/20 1730  Weight: 83 kg 94.9 kg 94.4 kg    Intake/Output: I/O last 3 completed shifts: In: 58 [Blood:366] Out: 500 [Urine:500]   Intake/Output this shift:  No intake/output data recorded.  Physical Exam: General: NAD, resting in bed  Head: Normocephalic, atraumatic. Moist oral mucosal membranes  Eyes: Anicteric  Lungs:  Clear to auscultation, normal effort  Heart: Regular rate and rhythm  Abdomen:  Soft, nontender  Extremities: trace peripheral edema.  Neurologic: Alert, moving all four extremities  Skin: No lesions       Basic Metabolic Panel: Recent Labs  Lab 12/01/20 0620 12/02/20 0601 12/03/20 0521 12/04/20 0609 12/05/20 0521  NA 136 137 138 136 138  K 3.9 3.9 3.8 3.7 3.6  CL 109 111 111 109 113*  CO2 20* 21* 22 20* 20*  GLUCOSE 148* 105* 88 79 64*  BUN 15 16 19 19  23*  CREATININE 1.86* 2.06* 2.41* 2.66* 2.88*  CALCIUM 7.5* 7.8* 7.6* 7.7* 7.7*  MG 1.8 2.0 1.8 1.7 1.7     Liver Function Tests: Recent Labs  Lab 11/30/20 0503  12/03/20 1140  AST 58* 62*  ALT 7 12  ALKPHOS 74 75  BILITOT 0.5 0.6  PROT 5.2* 5.0*  ALBUMIN 1.9* 1.9*    Recent Labs  Lab 12/01/20 0620 12/02/20 0601 12/03/20 0521 12/04/20 0609 12/05/20 0521  LIPASE 270* 227* 190* 149* 130*    No results for input(s): AMMONIA in the last 168 hours.  CBC: Recent Labs  Lab 12/01/20 0620 12/02/20 0601 12/03/20 0521 12/03/20 2118 12/04/20 0609 12/05/20 0521  WBC 4.5 4.2 3.4*  --  3.6* 3.0*  NEUTROABS  --  3.1 2.6  --  2.7 1.9  HGB 7.5* 7.4* 6.5* 8.6* 7.8* 7.6*  HCT 22.3* 22.6* 19.7* 25.6* 23.4* 22.8*  MCV 83.2 82.8 83.5  --  81.8 82.0  PLT 65* 63* 52*  --  47* 43*     Cardiac Enzymes: Recent Labs  Lab 12/03/20 1140  CKTOTAL 19*     BNP: Invalid input(s): POCBNP  CBG: Recent Labs  Lab 12/04/20 1552 12/04/20 2015 12/05/20 0747 12/05/20 0830 12/05/20 1136  GLUCAP 198* 90 60* 119* 144*     Microbiology: Results for orders placed or performed during the hospital encounter of 11/19/20  Blood culture (routine x 2)     Status: None   Collection Time: 11/19/20  4:47 PM   Specimen: BLOOD  Result Value Ref Range Status   Specimen Description BLOOD RIGHT ANTECUBITAL  Final   Special Requests   Final    BOTTLES DRAWN AEROBIC AND ANAEROBIC Blood Culture adequate  volume   Culture   Final    NO GROWTH 5 DAYS Performed at [facility], [address] [place], [place] [address]    Report Status 11/24/2020 FINAL  Final  Resp Panel by RT-PCR (Flu A&B, Covid) Nasopharyngeal Swab     Status: None   Collection Time: 11/19/20  4:48 PM   Specimen: Nasopharyngeal Swab; Nasopharyngeal(NP) swabs in vial transport medium  Result Value Ref Range Status   SARS Coronavirus 2 by RT PCR NEGATIVE NEGATIVE Final    Comment: (NOTE) SARS-CoV-2 target nucleic acids are NOT DETECTED.  The SARS-CoV-2 RNA is generally detectable in upper respiratory specimens during the acute phase of infection. The lowest concentration of  SARS-CoV-2 viral copies this assay can detect is 138 copies/mL. A negative result does not preclude SARS-Cov-2 infection and should not be used as the sole basis for treatment or other patient management decisions. A negative result may occur with  improper specimen collection/handling, submission of specimen other than nasopharyngeal swab, presence of viral mutation(s) within the areas targeted by this assay, and inadequate number of viral copies(<138 copies/mL). A negative result must be combined with clinical observations, patient history, and epidemiological information. The expected result is Negative.  Fact Sheet for Patients:  EntrepreneurPulse.com.au  Fact Sheet for Healthcare Providers:  IncredibleEmployment.be  This test is no t yet approved or cleared by the [place] FDA and  has been authorized for detection and/or diagnosis of SARS-CoV-2 by FDA under an Emergency Use Authorization (EUA). This EUA will remain  in effect (meaning this test can be used) for the duration of the COVID-19 declaration under Section 564(b)(1) of the Act, 21 U.S.C.section 360bbb-3(b)(1), unless the authorization is terminated  or revoked sooner.       Influenza A by PCR NEGATIVE NEGATIVE Final   Influenza B by PCR NEGATIVE NEGATIVE Final    Comment: (NOTE) The Xpert Xpress SARS-CoV-2/FLU/RSV plus assay is intended as an aid in the diagnosis of influenza from Nasopharyngeal swab specimens and should not be used as a sole basis for treatment. Nasal washings and aspirates are unacceptable for Xpert Xpress SARS-CoV-2/FLU/RSV testing.  Fact Sheet for Patients: EntrepreneurPulse.com.au  Fact Sheet for Healthcare Providers: IncredibleEmployment.be  This test is not yet approved or cleared by the [place] FDA and has been authorized for detection and/or diagnosis of SARS-CoV-2 by FDA under an Emergency Use  Authorization (EUA). This EUA will remain in effect (meaning this test can be used) for the duration of the COVID-19 declaration under Section 564(b)(1) of the Act, 21 U.S.C. section 360bbb-3(b)(1), unless the authorization is terminated or revoked.  Performed at [facility], [address] [place], [place] [address]   Blood culture (routine x 2)     Status: None   Collection Time: 11/19/20  6:46 PM   Specimen: BLOOD  Result Value Ref Range Status   Specimen Description BLOOD RIGHT ANTECUBITAL  Final   Special Requests   Final    BOTTLES DRAWN AEROBIC AND ANAEROBIC Blood Culture adequate volume   Culture   Final    NO GROWTH 5 DAYS Performed at [facility], [address], [place], [place] [address]    Report Status 11/24/2020 FINAL  Final  Urine Culture     Status: Abnormal   Collection Time: 11/19/20  9:33 PM   Specimen: Urine, Clean Catch  Result Value Ref Range Status   Specimen Description   Final    URINE, CLEAN CATCH Performed at [facility],  [place], [place] [address]    Special Requests   Final    NONE Performed at [facility], [address] [place], [place] [address]    Culture (A)  Final    <10,000 COLONIES/mL INSIGNIFICANT GROWTH Performed at [facility] [address], [place], [place] [address]    Report Status 11/21/2020 FINAL  Final  Culture, fungus without smear     Status: None (Preliminary result)   Collection Time: 11/20/20 11:09 AM   Specimen: CSF; Cerebrospinal Fluid  Result Value Ref Range Status   Specimen Description   Final    CSF Performed at [facility], [address] [place], [place] [address]    Special Requests   Final    NONE Performed at [facility], [address], [place], [place] [address]    Culture   Final    NO FUNGUS ISOLATED AFTER 15 DAYS Performed at [facility], [place] [address] [place], [place] [address]    Report Status  PENDING  Incomplete  CSF culture w Gram Stain     Status: None   Collection Time: 11/20/20 11:09 AM   Specimen: PATH Cytology CSF; Cerebrospinal Fluid  Result Value Ref Range Status   Specimen Description   Final    CSF Performed at [facility], [address], [place], [place] [address]    Special Requests   Final    NONE Performed at [facility], [address] [place], [place] [address]    Gram Stain   Final    NO ORGANISMS SEEN WBC SEEN RED BLOOD CELLS PRESENT Performed at [facility], [address], [place], [place] [address]    Culture   Final    NO GROWTH 3 DAYS Performed at [facility], [place] [address] [place], [place] [address]    Report Status 11/23/2020 FINAL  Final  Fungus Culture With Stain     Status: None (Preliminary result)   Collection Time: 11/20/20 11:09 AM   Specimen: PATH Cytology CSF; Cerebrospinal Fluid  Result Value Ref Range Status   Fungus Stain Final report  Final    Comment: (NOTE) Performed At: [facility] [place], [place] [id number] Rush Farmer MD [id number]:[id number]    Fungus (Mycology) Culture PENDING  Incomplete   Fungal Source CSF  Final    Comment: Performed at [facility], [address] [place], [place] [address]  Fungus Culture Result     Status: None   Collection Time: 11/20/20 11:09 AM  Result Value Ref Range Status   Result 1 Comment  Final    Comment: (NOTE) KOH/Calcofluor preparation:  no fungus observed. Performed At: [facility] [place], [place] [id number] Rush Farmer MD GY:[id number]   Resp Panel by RT-PCR (Flu A&B, Covid) Nasopharyngeal Swab     Status: None   Collection Time: 11/27/20  1:39 PM   Specimen: Nasopharyngeal Swab; Nasopharyngeal(NP) swabs in vial transport medium  Result Value Ref Range Status   SARS Coronavirus 2 by RT PCR NEGATIVE NEGATIVE Final    Comment: (NOTE) SARS-CoV-2 target nucleic acids are NOT  DETECTED.  The SARS-CoV-2 RNA is generally detectable in upper respiratory specimens during the acute phase of infection. The lowest concentration of SARS-CoV-2 viral copies this assay can detect is 138 copies/mL. A negative result does not preclude SARS-Cov-2 infection and should not be used as the sole basis for treatment or other  patient management decisions. A negative result may occur with  improper specimen collection/handling, submission of specimen other than nasopharyngeal swab, presence of viral mutation(s) within the areas targeted by this assay, and inadequate number of viral copies(<138 copies/mL). A negative result must be combined with clinical observations, patient history, and epidemiological information. The expected result is Negative.  Fact Sheet for Patients:  EntrepreneurPulse.com.au  Fact Sheet for Healthcare Providers:  IncredibleEmployment.be  This test is no t yet approved or cleared by the [place] FDA and  has been authorized for detection and/or diagnosis of SARS-CoV-2 by FDA under an Emergency Use Authorization (EUA). This EUA will remain  in effect (meaning this test can be used) for the duration of the COVID-19 declaration under Section 564(b)(1) of the Act, 21 U.S.C.section 360bbb-3(b)(1), unless the authorization is terminated  or revoked sooner.       Influenza A by PCR NEGATIVE NEGATIVE Final   Influenza B by PCR NEGATIVE NEGATIVE Final    Comment: (NOTE) The Xpert Xpress SARS-CoV-2/FLU/RSV plus assay is intended as an aid in the diagnosis of influenza from Nasopharyngeal swab specimens and should not be used as a sole basis for treatment. Nasal washings and aspirates are unacceptable for Xpert Xpress SARS-CoV-2/FLU/RSV testing.  Fact Sheet for Patients: EntrepreneurPulse.com.au  Fact Sheet for Healthcare Providers: IncredibleEmployment.be  This test is not yet  approved or cleared by the [place] FDA and has been authorized for detection and/or diagnosis of SARS-CoV-2 by FDA under an Emergency Use Authorization (EUA). This EUA will remain in effect (meaning this test can be used) for the duration of the COVID-19 declaration under Section 564(b)(1) of the Act, 21 U.S.C. section 360bbb-3(b)(1), unless the authorization is terminated or revoked.  Performed at [facility], [address] [place], [place] [address]   Urine Culture     Status: Abnormal   Collection Time: 11/27/20  2:15 PM   Specimen: Urine, Clean Catch  Result Value Ref Range Status   Specimen Description   Final    URINE, CLEAN CATCH Performed at [facility], [address] [place], [place] [address]    Special Requests   Final    NONE Performed at [facility], [place], [place] [address]    Culture 20,000 COLONIES/mL ENTEROBACTER AEROGENES (A)  Final   Report Status 11/30/2020 FINAL  Final   Organism ID, Bacteria ENTEROBACTER AEROGENES (A)  Final      Susceptibility   Enterobacter aerogenes - MIC*    CEFAZOLIN >=64 RESISTANT Resistant     CEFEPIME 1 SENSITIVE Sensitive     CEFTRIAXONE >=64 RESISTANT Resistant     CIPROFLOXACIN <=0.25 SENSITIVE Sensitive     GENTAMICIN <=1 SENSITIVE Sensitive     IMIPENEM <=0.25 SENSITIVE Sensitive     NITROFURANTOIN 64 INTERMEDIATE Intermediate     TRIMETH/SULFA <=20 SENSITIVE Sensitive     PIP/TAZO >=128 RESISTANT Resistant     * 20,000 COLONIES/mL ENTEROBACTER AEROGENES  CULTURE, BLOOD (ROUTINE X 2) w Reflex to ID Panel     Status: None   Collection Time: 11/27/20  8:23 PM   Specimen: BLOOD  Result Value Ref Range Status   Specimen Description BLOOD LAC  Final   Special Requests   Final    BOTTLES DRAWN AEROBIC AND ANAEROBIC Blood Culture results may not be optimal due to an inadequate volume of blood received in culture bottles   Culture   Final    NO GROWTH 5  DAYS Performed at  [facility], [address] [place], [place] [address]    Report Status 12/02/2020 FINAL  Final  CULTURE, BLOOD (ROUTINE X 2) w Reflex to ID Panel     Status: None   Collection Time: 11/27/20  9:32 PM   Specimen: BLOOD LEFT HAND  Result Value Ref Range Status   Specimen Description BLOOD LEFT HAND  Final   Special Requests   Final    BOTTLES DRAWN AEROBIC AND ANAEROBIC Blood Culture adequate volume   Culture   Final    NO GROWTH 5 DAYS Performed at [facility], [address], [place], [place] [address]    Report Status 12/02/2020 FINAL  Final    Coagulation Studies: No results for input(s): LABPROT, INR in the last 72 hours.  Urinalysis: Recent Labs    12/03/20 1045  COLORURINE YELLOW*  LABSPEC 1.013  PHURINE 6.0  GLUCOSEU NEGATIVE  HGBUR LARGE*  BILIRUBINUR NEGATIVE  KETONESUR NEGATIVE  PROTEINUR 30*  NITRITE NEGATIVE  LEUKOCYTESUR NEGATIVE       Imaging: DG Chest 1 View  Result Date: 12/04/2020 CLINICAL DATA:  Cough EXAM: CHEST 1 VIEW COMPARISON:  11/27/2020 FINDINGS: Few faint residual patchy opacities are present in the right mid lung. There is persistent basilar atelectasis as well. Asymmetric elevation of the right hemidiaphragm similar to prior. New left effusion. No right effusion. No pneumothorax. Stable cardiomediastinal contours. Right upper extremity PICC tip terminates at the superior cavoatrial junction. No acute osseous or soft tissue abnormality of the chest wall IMPRESSION: Few faint residual patchy opacities present in the right mid lung, possibly infectious or inflammatory as seen on CT and prior radiograph. New trace left effusion. Right upper extremity PICC terminates at the superior cavoatrial junction. Electronically Signed   By: Lovena Le M.D.   On: 12/04/2020 19:30   US Venous Img Lower Bilateral (DVT)  Result Date: 12/05/2020 CLINICAL DATA:  Fever unknown origin EXAM: BILATERAL LOWER EXTREMITY VENOUS  DOPPLER ULTRASOUND TECHNIQUE: Gray-scale sonography with compression, as well as color and duplex ultrasound, were performed to evaluate the deep venous system(s) from the level of the common femoral vein through the popliteal and proximal calf veins. COMPARISON:  None. FINDINGS: VENOUS On the left, normal compressibility of the common femoral, superficial femoral, and popliteal veins, as well as the visualized calf veins. Visualized portions of profunda femoral vein and great saphenous vein unremarkable. No filling defects to suggest DVT on grayscale or color Doppler imaging. Doppler waveforms show normal direction of venous flow, normal respiratory phasicity and response to augmentation. On the right, incomplete compressibility of the proximal popliteal vein with eccentric mural thickening containing linear echogenic regions. There is continued patency of the lumen with flow seen on color Doppler. Common femoral, femoral, and visualized calf veins are unremarkable. OTHER None. Limitations: none IMPRESSION: 1. Negative for acute DVT. 2. 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 Electronically Signed   By: Lucrezia Europe M.D.   On: 12/05/2020 07:25   US Venous Img Upper Bilat (DVT)  Result Date: 12/05/2020 CLINICAL DATA:  Fever of unknown origin, diabetes, numbness EXAM: BILATERAL UPPER EXTREMITY VENOUS DOPPLER ULTRASOUND TECHNIQUE: Gray-scale sonography with graded compression, as well as color Doppler and duplex ultrasound were performed to evaluate the bilateral upper extremity deep venous systems from the level of the subclavian vein and including the jugular, axillary, basilic, radial, ulnar and upper cephalic vein. Spectral Doppler was utilized to evaluate flow at rest and with distal augmentation maneuvers.  COMPARISON:  None. FINDINGS: RIGHT UPPER EXTREMITY Internal Jugular Vein: No evidence of thrombus. Normal compressibility, respiratory phasicity and  response to augmentation. Subclavian Vein: No evidence of thrombus. Normal compressibility, respiratory phasicity and response to augmentation. Axillary Vein: No evidence of thrombus. Normal compressibility, respiratory phasicity and response to augmentation. Cephalic Vein: Not visualized Basilic Vein: No evidence of thrombus. Normal compressibility, respiratory phasicity and response to augmentation. Brachial Veins: No evidence of thrombus. Normal compressibility, respiratory phasicity and response to augmentation. Radial Veins: No evidence of thrombus. Normal compressibility, respiratory phasicity and response to augmentation. Ulnar Veins: No evidence of thrombus. Normal compressibility, respiratory phasicity and response to augmentation. Venous Reflux:  None. Other Findings:  None. LEFT UPPER EXTREMITY Internal Jugular Vein: No evidence of thrombus. Normal compressibility, respiratory phasicity and response to augmentation. Subclavian Vein: No evidence of thrombus. Normal compressibility, respiratory phasicity and response to augmentation. Axillary Vein: No evidence of thrombus. Normal compressibility, respiratory phasicity and response to augmentation. Cephalic Vein: Not visualized Basilic Vein: No evidence of thrombus. Normal compressibility, respiratory phasicity and response to augmentation. Brachial Veins: No evidence of thrombus. Normal compressibility, respiratory phasicity and response to augmentation. Radial Veins: No evidence of thrombus. Normal compressibility, respiratory phasicity and response to augmentation. Ulnar Veins: No evidence of thrombus. Normal compressibility, respiratory phasicity and response to augmentation. Venous Reflux:  None. Other Findings:  None. IMPRESSION: No evidence of DVT within either upper extremity. Electronically Signed   By: Lucrezia Europe M.D.   On: 12/05/2020 07:28   CT BONE MARROW BIOPSY  Result Date: 12/04/2020 CLINICAL DATA:  Chronic lymphocytic leukemia EXAM: CT  GUIDED DEEP ILIAC BONE ASPIRATION AND CORE BIOPSY TECHNIQUE: Patient was placed prone on the CT gantry and limited axial scans through the pelvis were obtained. Appropriate skin entry site was identified. Skin site was marked, prepped with chlorhexidine, draped in usual sterile fashion, and infiltrated locally with 1% lidocaine. Patient was given Versed 2 mg IV for anxiolysis during continuous cardiorespiratory monitoring by the radiology RN, during the procedure time of 11 minutes. Under CT fluoroscopic guidance an 11-gauge Cook trocar bone needle was advanced into the right iliac bone just lateral to the sacroiliac joint. Once needle tip position was confirmed, aspiration and 2 core samples were obtained, submitted to pathology for approval. Post procedure scans show no hematoma or fracture. Patient tolerated procedure well. COMPLICATIONS: COMPLICATIONS none IMPRESSION: 1. Technically successful CT guided right iliac bone core and aspiration biopsy. Electronically Signed   By: Lucrezia Europe M.D.   On: 12/04/2020 15:53     Medications:      Chlorhexidine Gluconate Cloth  6 each Topical Daily   dextrose  12.5 g Intravenous Once   diltiazem  120 mg Oral Daily   feeding supplement  1 Container Oral TID BM   hydrALAZINE  50 mg Oral Q8H   insulin aspart  0-15 Units Subcutaneous TID WC   insulin glargine-yfgn  5 Units Subcutaneous Daily   levothyroxine  100 mcg Oral QAC breakfast   magnesium oxide  800 mg Oral BID   mirtazapine  15 mg Oral QHS   multivitamin with minerals  1 tablet Oral Daily   pantoprazole  40 mg Oral Daily   psyllium  1 packet Oral Daily   rosuvastatin  10 mg Oral QHS   sodium chloride flush  10-40 mL Intracatheter Q12H   thiamine  100 mg Oral Daily   acetaminophen **OR** acetaminophen, albuterol, haloperidol lactate, loperamide, ondansetron **OR** ondansetron (ZOFRAN) IV, polyethylene glycol, sodium chloride flush  Assessment/ Plan:  [name] is a 55 y.o.  female  with medical conditions including pneumococcal meningitis bacteremia, hypothyroidism, Type 2 diabetes, and lymphoma in remission. She presents to the ED with confusion and is admitted for Altered mental status [R41.82] Encephalopathy acute [G93.40] Altered mental status, unspecified altered mental status type [R41.82] Sepsis with encephalopathy without septic shock, due to unspecified organism ([place]) [A41.9, R65.20, G93.40]   Acute Kidney Injury with baseline creatinine 0.49 on 11/22/20.  Acute kidney injury secondary to contrast induced ATN. IV contrast exposure on November 21, 2020.  UA shows protienuria, >50 RBCs, 21-50 WBC, microbiology 20k colonies- Enterobacter. Renal ultrasound negative for mass and obstruction. Questionable small urterocele. No acute need for dialysis at this time Creatinine increased, UOP 842m UA improved and protein creatinine ratio increased Pending testing and serologies: ANCA, ANA,  Kappa light, and SPEP. C3, C4, and total complements decreased indicating an infectious process in progress. Strict intake and output Continue to avoid hypotension, ACE/ARBs and any nephrotoxic agents Will continue to monitor closely  Lab Results  Component Value Date   CREATININE 2.88 (H) 12/05/2020   CREATININE 2.66 (H) 12/04/2020   CREATININE 2.41 (H) 12/03/2020   No intake or output data in the 24 hours ending 12/05/20 1240  2. Anemia  with history of CLL pancytopenia Lab Results  Component Value Date   HGB 7.6 (L) 12/05/2020  Transfused 1 unit RBC on 12/04/20 Bone marrow biopsy 12/04/20 Oncology following    LOS: 1[place]8/12/202212:40 PM

## 2020-12-05 NOTE — Progress Notes (Signed)
PROGRESS NOTE    Tonya Myers  ONG:[id number] DOB: 02/16/1966 DOA: 11/19/2020 PCP: [name], MD   Brief Narrative:  55 y.o. female with medical history significant for recent pneumococcal meningitis and bacteremia, depression, hypothyroid, IDDM2, presents to the ED for chief concern of altered mentation.  Extended hospitalization.  Etiology of encephalopathy is unclear.  Encephalopathy has resolved and patient has baseline level of mentation however she began to spike intermittent fevers associated with progressive worsening of kidney function.  Nephrology and infectious disease both following.  Patient is on IV antibiotics and intravenous fluids.  Work-up has been unrevealing for infectious cause.  Progressively worsening renal failure.  Serologic workup in progress.  BM biopsy 8/11   Assessment & Plan:   Principal Problem:   Subacute delirium Active Problems:   DDD (degenerative disc disease), cervical   Aphasia   Anemia of chronic disease   Acute focal neurological deficit   Encephalitis   Altered mental status   Encephalopathy acute  Fever, recurrent Hx of epidural and paraspinal abscess daily fevers  repeat covid neg.  No cough or dyspnea to suggest PNA.  Procal increased. MRI brain, spine 8/5, no obvious infection Case discussed at length with ID No clear infectious focus Ciprofloxacin and doxycycline. Continues to have daily fevers Not septic or toxic appearing  For extremity Doppler negative for VTE Inflammatory markers elevated but downtrending Plan: Monitor vitals and fever curve off of antibiotics Follow-up bone marrow biopsy  AKI --baseline ~0.7.  Urine appeared very dark.   --nephrology consulted --renal US showed medical renal disease Creatinine continues to worsen Plan: Nephrology following for acute kidney injury.  Serologic work-up in progress.  Currently no emergent indications for hemodialysis.   Acute pancreatitis Unclear etiology No  reported history of alcohol intake, no radiographic evidence of gallstones Possible drug-induced however unclear exactly which drug could have driven this Initial abdominal examination negative for tenderness Lipase trending down with IVF, but much slowly than usual. Triglycerides negative Plan: Patient tolerating p.o. intake.  Suspect the pancreatitis is resolved If continued concern for pancreatic abscess versus pseudocyst a contrast-enhanced CT scan will need to be done which currently is not possible due to progressive renal failure   Acute toxic metabolic encephalopathy, resolved Unclear etiology --meningitis ruled out by LP.  CT head neg for new stroke.  Hematology ruled out CLL as the culprit.  No seizure on EEG. --mental status dramatically improved with treatment of pancreatitis and IV thiamine repletion. 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 intake is driven by appetite rather than pain --s/p Diflucan for 3 doses   Insulin-dependent diabetes mellitus Hypoglycemic episodes Patient is on long-acting formulation 50 units daily at home in addition to metformin --A1c 6.5 AM hypoglycemia noted Plan: --cont glargine 5 u daily --SSI   Hyperlipidemia --cont statin   Hypothyroid --cont  Synthroid   HTN --cont cardizem and hydralazine   DVT prophylaxis: TED hose Code Status: Full code Family Communication: Spouse [name] [phone] on 8/11 Disposition Plan:Status is: Inpatient  Remains inpatient appropriate because:Inpatient level of care appropriate due to severity of illness  Dispo: The patient is from: Home              Anticipated d/c is to: Home              Patient currently is not medically stable to d/c.   Difficult to place patient No  Encephalopathy resolved.  Intermittent fevers and worsening AKI.  Not medically stable for discharge at this time.  Status post bone marrow biopsy.  Results pending.  Serologic work-up for AKI also pending.     Level of care: Med-Surg  Consultants:  Nephrology ID  Procedures: LP Bone marrow biopsy  Antimicrobials:  Ciprofloxacin   Subjective: Seen and examined.  Sitting comfortably in bed.  No visible distress.  Answers questions appropriately.  Objective: Vitals:   12/05/20 0006 12/05/20 0148 12/05/20 0501 12/05/20 0749  BP: (!) 153/75  140/76 136/71  Pulse: 85  78 78  Resp: _0 Temp: (!) 101 F (38.3 C) (!) 100.7 F (38.2 C) 99.6 F (37.6 C) 99.6 F (37.6 C)  TempSrc:  Oral    SpO2: 96%  98% 97%  Weight:      Height:        Intake/Output Summary (Last 24 hours) at 12/05/2020 1039 Last data filed at 12/04/2020 1150 Gross per 24 hour  Intake --  Output 250 ml  Net -250 ml   Filed Weights   11/20/20 0507 12/01/20 0920 12/01/20 1730  Weight: 83 kg 94.9 kg 94.4 kg    Examination:  General exam: No acute distress Respiratory system: Clear to auscultation. Respiratory effort normal. Cardiovascular system: S1-S2, regular rate and rhythm, no murmurs, no pedal edema  gastrointestinal system: Soft, nontender, nondistended, normal bowel sounds Central nervous system: Alert and oriented. No focal neurological deficits. Extremities: Symmetric 5 x 5 power. Skin: No rashes, lesions or  ulcers Psychiatry: Judgement and insight appear normal. Mood & affect flattened.     Data Reviewed: I have personally reviewed following labs and imaging studies  CBC: Recent Labs  Lab 12/01/20 0620 12/02/20 0601 12/03/20 0521 12/03/20 2118 12/04/20 0609 12/05/20 0521  WBC 4.5 4.2 3.4*  --  3.6* 3.0*  NEUTROABS  --  3.1 2.6  --  2.7 1.9  HGB 7.5* 7.4* 6.5* 8.6* 7.8* 7.6*  HCT 22.3* 22.6* 19.7* 25.6* 23.4* 22.8*  MCV 83.2 82.8 83.5  --  81.8 82.0  PLT 65* 63* 52*  --  47* 43*   Basic Metabolic Panel: Recent Labs  Lab 12/01/20 0620 12/02/20 0601 12/03/20 0521 12/04/20 0609 12/05/20 0521  NA 136 137 138 136 138  K 3.9 3.9 3.8 3.7 3.6  CL 109 111 111 109 113*  CO2 20* 21* 22 20* 20*  GLUCOSE 148* 105* 88 79 64*  BUN _1 23*  CREATININE 1.86* 2.06* 2.41* 2.66* 2.88*  CALCIUM 7.5* 7.8* 7.6* 7.7* 7.7*  MG 1.8 2.0 1.8 1.7 1.7   GFR: Estimated Creatinine Clearance: 28.1 mL/min (A) (by C-G formula based on SCr of 2.88 mg/dL (H)). Liver Function Tests: Recent Labs  Lab 11/30/20 0503 12/03/20 1140  AST 58* 62*  ALT 7 12  ALKPHOS 74 75  BILITOT 0.5 0.6  PROT 5.2* 5.0*  ALBUMIN 1.9* 1.9*  Recent Labs  Lab 12/01/20 0620 12/02/20 0601 12/03/20 0521 12/04/20 0609 12/05/20 0521  LIPASE 270* 227* 190* 149* 130*   No results for input(s): AMMONIA in the last 168 hours. Coagulation Profile: No results for input(s): INR, PROTIME in the last 168 hours. Cardiac Enzymes: Recent Labs  Lab 12/03/20 1140  CKTOTAL 19*   BNP (last 3 results) No results for input(s): PROBNP in the last 8760 hours. HbA1C: No results for input(s): HGBA1C in the last 72 hours. CBG: Recent Labs  Lab 12/04/20 1127 12/04/20 1552 12/04/20 2015 12/05/20 0747 12/05/20 0830  GLUCAP 78 198* 90 60* 119*   Lipid Profile: No results for input(s): CHOL, HDL, LDLCALC, TRIG, CHOLHDL, LDLDIRECT in the last 72 hours. Thyroid Function Tests: No results for input(s): TSH, T4TOTAL,  FREET4, T3FREE, THYROIDAB in the last 72 hours. Anemia Panel: Recent Labs    12/04/20 2029  FERRITIN 807*   Sepsis Labs: Recent Labs  Lab 11/29/20 0419 12/02/20 0601 12/03/20 0521 12/04/20 0609  PROCALCITON 1.80 1.74 1.71 2.13    Recent Results (from the past 240 hour(s))  Resp Panel by RT-PCR (Flu A&B, Covid) Nasopharyngeal Swab     Status: None   Collection Time: 11/27/20  1:39 PM   Specimen: Nasopharyngeal Swab; Nasopharyngeal(NP) swabs in vial transport medium  Result Value Ref Range Status   SARS Coronavirus 2 by RT PCR NEGATIVE NEGATIVE Final    Comment: (NOTE) SARS-CoV-2 target nucleic acids are NOT DETECTED.  The SARS-CoV-2 RNA is generally detectable in upper respiratory specimens during the acute phase of infection. The lowest concentration of SARS-CoV-2 viral copies this assay can detect is 138 copies/mL. A negative result does not preclude SARS-Cov-2 infection and should not be used as the sole basis for treatment or other patient management decisions. A negative result may occur with  improper specimen collection/handling, submission of specimen other than nasopharyngeal swab, presence of viral mutation(s) within the areas targeted by this assay, and inadequate number of viral copies(<138 copies/mL). A negative result must be combined with clinical observations, patient history, and epidemiological information. The expected result is Negative.  Fact Sheet for Patients:  EntrepreneurPulse.com.au  Fact Sheet for Healthcare Providers:  IncredibleEmployment.be  This test is no t yet approved or cleared by the [place] FDA and  has been authorized for detection and/or diagnosis of SARS-CoV-2 by FDA under an Emergency Use Authorization (EUA). This EUA will remain  in effect (meaning this test can be used) for the duration of the COVID-19 declaration under Section 564(b)(1) of the Act, 21 U.S.C.section 360bbb-3(b)(1),  unless the authorization is terminated  or revoked sooner.       Influenza A by PCR NEGATIVE NEGATIVE Final   Influenza B by PCR NEGATIVE NEGATIVE Final    Comment: (NOTE) The Xpert Xpress SARS-CoV-2/FLU/RSV plus assay is intended as an aid in the diagnosis of influenza from Nasopharyngeal swab specimens and should not be used as a sole basis for treatment. Nasal washings and aspirates are unacceptable for Xpert Xpress SARS-CoV-2/FLU/RSV testing.  Fact Sheet for Patients: EntrepreneurPulse.com.au  Fact Sheet for Healthcare Providers: IncredibleEmployment.be  This test is not yet approved or cleared by the [place] FDA and has been authorized for detection and/or diagnosis of SARS-CoV-2 by FDA under an Emergency Use Authorization (EUA). This EUA will remain in effect (meaning this test can be used) for the duration of the COVID-19 declaration under Section 564(b)(1) of the Act, 21 U.S.C. section 360bbb-3(b)(1), unless the authorization is terminated or revoked.  Performed  at [facility], [address] [place], [place] [address]   Urine Culture     Status: Abnormal   Collection Time: 11/27/20  2:15 PM   Specimen: Urine, Clean Catch  Result Value Ref Range Status   Specimen Description   Final    URINE, CLEAN CATCH Performed at [facility], [address], [place], [place] [address]    Special Requests   Final    NONE Performed at [facility], [place], [place] [address]    Culture 20,000 COLONIES/mL ENTEROBACTER AEROGENES (A)  Final   Report Status 11/30/2020 FINAL  Final   Organism ID, Bacteria ENTEROBACTER AEROGENES (A)  Final      Susceptibility   Enterobacter aerogenes - MIC*    CEFAZOLIN >=64 RESISTANT Resistant     CEFEPIME 1 SENSITIVE Sensitive     CEFTRIAXONE >=64 RESISTANT Resistant     CIPROFLOXACIN <=0.25 SENSITIVE Sensitive     GENTAMICIN <=1 SENSITIVE Sensitive      IMIPENEM <=0.25 SENSITIVE Sensitive     NITROFURANTOIN 64 INTERMEDIATE Intermediate     TRIMETH/SULFA <=20 SENSITIVE Sensitive     PIP/TAZO >=128 RESISTANT Resistant     * 20,000 COLONIES/mL ENTEROBACTER AEROGENES  CULTURE, BLOOD (ROUTINE X 2) w Reflex to ID Panel     Status: None   Collection Time: 11/27/20  8:23 PM   Specimen: BLOOD  Result Value Ref Range Status   Specimen Description BLOOD LAC  Final   Special Requests   Final    BOTTLES DRAWN AEROBIC AND ANAEROBIC Blood Culture results may not be optimal due to an inadequate volume of blood received in culture bottles   Culture   Final    NO GROWTH 5 DAYS Performed at [facility], [address] [place], [place] [address]    Report Status 12/02/2020 FINAL  Final  CULTURE, BLOOD (ROUTINE X 2) w Reflex to ID Panel     Status: None   Collection Time: 11/27/20  9:32 PM   Specimen: BLOOD LEFT HAND  Result Value Ref Range Status   Specimen Description BLOOD LEFT HAND  Final   Special Requests   Final    BOTTLES DRAWN AEROBIC AND ANAEROBIC Blood Culture adequate volume   Culture   Final    NO GROWTH 5 DAYS Performed at [facility], [address] [place], [place] [address]    Report Status 12/02/2020 FINAL  Final         Radiology Studies: DG Chest 1 View  Result Date: 12/04/2020 CLINICAL DATA:  Cough EXAM: CHEST 1 VIEW COMPARISON:  11/27/2020 FINDINGS: Few faint residual patchy opacities are present in the right mid lung. There is persistent basilar atelectasis as well. Asymmetric elevation of the right hemidiaphragm similar to prior. New left effusion. No right effusion. No pneumothorax. Stable cardiomediastinal contours. Right upper extremity PICC tip terminates at the superior cavoatrial junction. No acute osseous or soft tissue abnormality of the chest wall IMPRESSION: Few faint residual patchy opacities present in the right mid lung, possibly infectious or inflammatory as seen on CT and prior  radiograph. New trace left effusion. Right upper extremity PICC terminates at the superior cavoatrial junction. Electronically Signed   By: Lovena Le M.D.   On: 12/04/2020 19:30   US Venous Img Lower Bilateral (DVT)  Result Date: 12/05/2020 CLINICAL DATA:  Fever unknown origin EXAM: BILATERAL LOWER EXTREMITY VENOUS DOPPLER ULTRASOUND TECHNIQUE: Gray-scale sonography with compression, as well as color and duplex  ultrasound, were performed to evaluate the deep venous system(s) from the level of the common femoral vein through the popliteal and proximal calf veins. COMPARISON:  None. FINDINGS: VENOUS On the left, normal compressibility of the common femoral, superficial femoral, and popliteal veins, as well as the visualized calf veins. Visualized portions of profunda femoral vein and great saphenous vein unremarkable. No filling defects to suggest DVT on grayscale or color Doppler imaging. Doppler waveforms show normal direction of venous flow, normal respiratory phasicity and response to augmentation. On the right, incomplete compressibility of the proximal popliteal vein with eccentric mural thickening containing linear echogenic regions. There is continued patency of the lumen with flow seen on color Doppler. Common femoral, femoral, and visualized calf veins are unremarkable. OTHER None. Limitations: none IMPRESSION: 1. Negative for acute DVT. 2. 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 Electronically Signed   By: Lucrezia Europe M.D.   On: 12/05/2020 07:25   US Venous Img Upper Bilat (DVT)  Result Date: 12/05/2020 CLINICAL DATA:  Fever of unknown origin, diabetes, numbness EXAM: BILATERAL UPPER EXTREMITY VENOUS DOPPLER ULTRASOUND TECHNIQUE: Gray-scale sonography with graded compression, as well as color Doppler and duplex ultrasound were performed to evaluate the bilateral upper extremity deep venous systems from the level of the subclavian vein and  including the jugular, axillary, basilic, radial, ulnar and upper cephalic vein. Spectral Doppler was utilized to evaluate flow at rest and with distal augmentation maneuvers. COMPARISON:  None. FINDINGS: RIGHT UPPER EXTREMITY Internal Jugular Vein: No evidence of thrombus. Normal compressibility, respiratory phasicity and response to augmentation. Subclavian Vein: No evidence of thrombus. Normal compressibility, respiratory phasicity and response to augmentation. Axillary Vein: No evidence of thrombus. Normal compressibility, respiratory phasicity and response to augmentation. Cephalic Vein: Not visualized Basilic Vein: No evidence of thrombus. Normal compressibility, respiratory phasicity and response to augmentation. Brachial Veins: No evidence of thrombus. Normal compressibility, respiratory phasicity and response to augmentation. Radial Veins: No evidence of thrombus. Normal compressibility, respiratory phasicity and response to augmentation. Ulnar Veins: No evidence of thrombus. Normal compressibility, respiratory phasicity and response to augmentation. Venous Reflux:  None. Other Findings:  None. LEFT UPPER EXTREMITY Internal Jugular Vein: No evidence of thrombus. Normal compressibility, respiratory phasicity and response to augmentation. Subclavian Vein: No evidence of thrombus. Normal compressibility, respiratory phasicity and response to augmentation. Axillary Vein: No evidence of thrombus. Normal compressibility, respiratory phasicity and response to augmentation. Cephalic Vein: Not visualized Basilic Vein: No evidence of thrombus. Normal compressibility, respiratory phasicity and response to augmentation. Brachial Veins: No evidence of thrombus. Normal compressibility, respiratory phasicity and response to augmentation. Radial Veins: No evidence of thrombus. Normal compressibility, respiratory phasicity and response to augmentation. Ulnar Veins: No evidence of thrombus. Normal compressibility,  respiratory phasicity and response to augmentation. Venous Reflux:  None. Other Findings:  None. IMPRESSION: No evidence of DVT within either upper extremity. Electronically Signed   By: Lucrezia Europe M.D.   On: 12/05/2020 07:28   CT BONE MARROW BIOPSY  Result Date: 12/04/2020 CLINICAL DATA:  Chronic lymphocytic leukemia EXAM: CT GUIDED DEEP ILIAC BONE ASPIRATION AND CORE BIOPSY TECHNIQUE: Patient was placed prone on the CT gantry and limited axial scans through the pelvis were obtained. Appropriate skin entry site was identified. Skin site was marked, prepped with chlorhexidine, draped in usual sterile fashion, and infiltrated locally with 1% lidocaine. Patient was given Versed 2 mg IV for anxiolysis during continuous cardiorespiratory monitoring by the radiology RN, during the procedure time of 11 minutes.  Under CT fluoroscopic guidance an 11-gauge Cook trocar bone needle was advanced into the right iliac bone just lateral to the sacroiliac joint. Once needle tip position was confirmed, aspiration and 2 core samples were obtained, submitted to pathology for approval. Post procedure scans show no hematoma or fracture. Patient tolerated procedure well. COMPLICATIONS: COMPLICATIONS none IMPRESSION: 1. Technically successful CT guided right iliac bone core and aspiration biopsy. Electronically Signed   By: Lucrezia Europe M.D.   On: 12/04/2020 15:53        Scheduled Meds:  Chlorhexidine Gluconate Cloth  6 each Topical Daily   dextrose  12.5 g Intravenous Once   diltiazem  120 mg Oral Daily   feeding supplement  1 Container Oral TID BM   hydrALAZINE  50 mg Oral Q8H   insulin aspart  0-15 Units Subcutaneous TID WC   insulin glargine-yfgn  5 Units Subcutaneous Daily   levothyroxine  100 mcg Oral QAC breakfast   magnesium oxide  800 mg Oral BID   mirtazapine  15 mg Oral QHS   multivitamin with minerals  1 tablet Oral Daily   pantoprazole  40 mg Oral Daily   psyllium  1 packet Oral Daily   rosuvastatin  10  mg Oral QHS   sodium chloride flush  10-40 mL Intracatheter Q12H   thiamine  100 mg Oral Daily   Continuous Infusions:     LOS: 15 days    Time spent: 25 minutes    Sidney Ace, MD [facility] Pager [phone]  If 7PM-7AM, please contact night-coverage 12/05/2020, 10:39 AM

## 2020-12-06 DIAGNOSIS — N179 Acute kidney failure, unspecified: Secondary | ICD-10-CM | POA: Diagnosis not present

## 2020-12-06 DIAGNOSIS — R509 Fever, unspecified: Secondary | ICD-10-CM | POA: Diagnosis not present

## 2020-12-06 LAB — BASIC METABOLIC PANEL
Anion gap: 11 (ref 5–15)
BUN: 31 mg/dL — ABNORMAL HIGH (ref 6–20)
CO2: 16 mmol/L — ABNORMAL LOW (ref 22–32)
Calcium: 7.7 mg/dL — ABNORMAL LOW (ref 8.9–10.3)
Chloride: 108 mmol/L (ref 98–111)
Creatinine, Ser: 3.22 mg/dL — ABNORMAL HIGH (ref 0.44–1.00)
GFR, Estimated: 16 mL/min — ABNORMAL LOW (ref >60)
Glucose, Bld: 361 mg/dL — ABNORMAL HIGH (ref 70–99)
Potassium: 4.3 mmol/L (ref 3.5–5.1)
Sodium: 135 mmol/L (ref 135–145)

## 2020-12-06 LAB — CBC WITH DIFFERENTIAL/PLATELET
Abs Immature Granulocytes: 0.01 10*3/uL (ref 0.00–0.07)
Basophils Absolute: 0 10*3/uL (ref 0.0–0.1)
Basophils Relative: 0 %
Eosinophils Absolute: 0 10*3/uL (ref 0.0–0.5)
Eosinophils Relative: 0 %
HCT: 23.9 % — ABNORMAL LOW (ref 36.0–46.0)
Hemoglobin: 7.9 g/dL — ABNORMAL LOW (ref 12.0–15.0)
Immature Granulocytes: 0 %
Lymphocytes Relative: 16 %
Lymphs Abs: 0.4 10*3/uL — ABNORMAL LOW (ref 0.7–4.0)
MCH: 27.1 pg (ref 26.0–34.0)
MCHC: 33.1 g/dL (ref 30.0–36.0)
MCV: 81.8 fL (ref 80.0–100.0)
Monocytes Absolute: 0.1 10*3/uL (ref 0.1–1.0)
Monocytes Relative: 2 %
Neutro Abs: 2.1 10*3/uL (ref 1.7–7.7)
Neutrophils Relative %: 82 %
Platelets: 35 10*3/uL — ABNORMAL LOW (ref 150–400)
RBC: 2.92 MIL/uL — ABNORMAL LOW (ref 3.87–5.11)
RDW: 17.2 % — ABNORMAL HIGH (ref 11.5–15.5)
Smear Review: NORMAL
WBC: 2.6 10*3/uL — ABNORMAL LOW (ref 4.0–10.5)
nRBC: 0 % (ref 0.0–0.2)

## 2020-12-06 LAB — GLUCOSE, CAPILLARY
Glucose-Capillary: 244 mg/dL — ABNORMAL HIGH (ref 70–99)
Glucose-Capillary: 351 mg/dL — ABNORMAL HIGH (ref 70–99)
Glucose-Capillary: 441 mg/dL — ABNORMAL HIGH (ref 70–99)

## 2020-12-06 LAB — LIPASE, BLOOD: Lipase: 90 U/L — ABNORMAL HIGH (ref 11–51)

## 2020-12-06 MED ORDER — SODIUM BICARBONATE 650 MG PO TABS
650.0000 mg | ORAL_TABLET | Freq: Three times a day (TID) | ORAL | Status: DC
  Administered 2020-12-06 – 2020-12-20 (×37): 650 mg via ORAL
  Filled 2020-12-06 (×44): qty 1

## 2020-12-06 MED ORDER — INSULIN ASPART 100 UNIT/ML IJ SOLN
10.0000 [IU] | Freq: Once | INTRAMUSCULAR | Status: AC
  Administered 2020-12-06: 10 [IU] via SUBCUTANEOUS
  Filled 2020-12-06: qty 1

## 2020-12-06 MED ORDER — INSULIN GLARGINE-YFGN 100 UNIT/ML ~~LOC~~ SOLN
12.0000 [IU] | Freq: Two times a day (BID) | SUBCUTANEOUS | Status: DC
  Administered 2020-12-06 – 2020-12-07 (×3): 12 [IU] via SUBCUTANEOUS
  Filled 2020-12-06 (×5): qty 0.12

## 2020-12-06 MED ORDER — INSULIN GLARGINE-YFGN 100 UNIT/ML ~~LOC~~ SOLN
8.0000 [IU] | Freq: Every day | SUBCUTANEOUS | Status: DC
  Filled 2020-12-06: qty 0.08

## 2020-12-06 MED ORDER — INSULIN ASPART 100 UNIT/ML IJ SOLN
5.0000 [IU] | Freq: Three times a day (TID) | INTRAMUSCULAR | Status: DC
  Administered 2020-12-07 – 2020-12-08 (×4): 5 [IU] via SUBCUTANEOUS
  Filled 2020-12-06 (×4): qty 1

## 2020-12-06 MED ORDER — INSULIN GLARGINE-YFGN 100 UNIT/ML ~~LOC~~ SOLN
3.0000 [IU] | Freq: Once | SUBCUTANEOUS | Status: AC
  Administered 2020-12-06: 3 [IU] via SUBCUTANEOUS
  Filled 2020-12-06: qty 0.03

## 2020-12-06 NOTE — Progress Notes (Signed)
PT Cancellation Note  Patient Details Name: Tonya Myers MRN: [id number] DOB: 27-Jul-1965   Cancelled Treatment:     PT attempt. Attempt 2 x today. Pt unwilling to work requesting to get day off from PT today. She was able to ambulate some yesterday however today complaining of severe weakness and lack of energy. Acute PT will continue to follow and progress as able per current POC.    Willette Pa 12/06/2020, 12:06 PM

## 2020-12-06 NOTE — Progress Notes (Signed)
[facility]  ROUNDING NOTE   Subjective:   Tonya Myers is a 55 y.o. female with medical conditions including pneumococcal meningitis bacteremia, hypothyroidism, Type 2 diabetes, and lymphoma in remission. She presents to the ED with confusion and is admitted for Altered mental status [R41.82] Encephalopathy acute [G93.40] Altered mental status, unspecified altered mental status type [R41.82] Sepsis with encephalopathy without septic shock, due to unspecified organism ([place]) [A41.9, R65.20, G93.40]  Patient is seen resting in bed. Patient seen today on the first floor Patient offers no new specific physical complaints patient was emotional about current health situation patient husband was present in the room as well.  Patient asked me relevant question about her kidney situation. I explained to the patient about her current GFR/antibodies being positive and possible need for renal biopsy. Patient comment about kidney biopsy was that she would think about kidney biopsy after her birthday which happens to be tomorrow.  Patient said talk to me after tomorrow about going to  go for biopsy.    Objective:  Vital signs in last 24 hours:  Temp:  [98 F (36.7 C)-100.2 F (37.9 C)] 98 F (36.7 C) (08/13 1219) Pulse Rate:  [76-88] 76 (08/13 1219) Resp:  [16-20] 18 (08/13 1219) BP: (122-130)/(63-81) 124/74 (08/13 1219) SpO2:  [97 %-99 %] 97 % (08/13 1219)  Weight change:  Filed Weights   11/20/20 0507 12/01/20 0920 12/01/20 1730  Weight: 83 kg 94.9 kg 94.4 kg    Intake/Output: I/O last 3 completed shifts: In: 62 [IV Piggyback:50] Out: 700 [Urine:700]   Intake/Output this shift:  Total I/O In: 420 [P.O.:420] Out: -   Physical Exam: General: NAD, resting in bed  Head: Normocephalic, atraumatic. Moist oral mucosal membranes  Eyes: Anicteric  Lungs:  Clear to auscultation, normal effort  Heart: Regular rate and rhythm  Abdomen:  Soft, nontender  Extremities: trace  peripheral edema.  Neurologic: Alert, moving all four extremities  Skin: No lesions       Basic Metabolic Panel: Recent Labs  Lab 12/01/20 0620 12/02/20 0601 12/03/20 0521 12/04/20 0609 12/05/20 0521 12/06/20 1035  NA 136 137 138 136 138 135  K 3.9 3.9 3.8 3.7 3.6 4.3  CL 109 111 111 109 113* 108  CO2 20* 21* 22 20* 20* 16*  GLUCOSE 148* 105* 88 79 64* 361*  BUN 15 16 19 19  23* 31*  CREATININE 1.86* 2.06* 2.41* 2.66* 2.88* 3.22*  CALCIUM 7.5* 7.8* 7.6* 7.7* 7.7* 7.7*  MG 1.8 2.0 1.8 1.7 1.7  --     Liver Function Tests: Recent Labs  Lab 11/30/20 0503 12/03/20 1140  AST 58* 62*  ALT 7 12  ALKPHOS 74 75  BILITOT 0.5 0.6  PROT 5.2* 5.0*  ALBUMIN 1.9* 1.9*   Recent Labs  Lab 12/02/20 0601 12/03/20 0521 12/04/20 0609 12/05/20 0521 12/06/20 1035  LIPASE 227* 190* 149* 130* 90*   No results for input(s): AMMONIA in the last 168 hours.  CBC: Recent Labs  Lab 12/02/20 0601 12/03/20 0521 12/03/20 2118 12/04/20 0609 12/05/20 0521 12/06/20 1035  WBC 4.2 3.4*  --  3.6* 3.0* 2.6*  NEUTROABS 3.1 2.6  --  2.7 1.9 2.1  HGB 7.4* 6.5* 8.6* 7.8* 7.6* 7.9*  HCT 22.6* 19.7* 25.6* 23.4* 22.8* 23.9*  MCV 82.8 83.5  --  81.8 82.0 81.8  PLT 63* 52*  --  47* 43* 35*    Cardiac Enzymes: Recent Labs  Lab 12/03/20 1140  CKTOTAL 19*    BNP: Invalid  input(s): POCBNP  CBG: Recent Labs  Lab 12/05/20 1136 12/05/20 1544 12/05/20 2004 12/06/20 0831 12/06/20 1220  GLUCAP 144* 88 116* 244* 351*    Microbiology: Results for orders placed or performed during the hospital encounter of 11/19/20  Blood culture (routine x 2)     Status: None   Collection Time: 11/19/20  4:47 PM   Specimen: BLOOD  Result Value Ref Range Status   Specimen Description BLOOD RIGHT ANTECUBITAL  Final   Special Requests   Final    BOTTLES DRAWN AEROBIC AND ANAEROBIC Blood Culture adequate volume   Culture   Final    NO GROWTH 5 DAYS Performed at [facility], [address] [place], [place] [address]    Report Status 11/24/2020 FINAL  Final  Resp Panel by RT-PCR (Flu A&B, Covid) Nasopharyngeal Swab     Status: None   Collection Time: 11/19/20  4:48 PM   Specimen: Nasopharyngeal Swab; Nasopharyngeal(NP) swabs in vial transport medium  Result Value Ref Range Status   SARS Coronavirus 2 by RT PCR NEGATIVE NEGATIVE Final    Comment: (NOTE) SARS-CoV-2 target nucleic acids are NOT DETECTED.  The SARS-CoV-2 RNA is generally detectable in upper respiratory specimens during the acute phase of infection. The lowest concentration of SARS-CoV-2 viral copies this assay can detect is 138 copies/mL. A negative result does not preclude SARS-Cov-2 infection and should not be used as the sole basis for treatment or other patient management decisions. A negative result may occur with  improper specimen collection/handling, submission of specimen other than nasopharyngeal swab, presence of viral mutation(s) within the areas targeted by this assay, and inadequate number of viral copies(<138 copies/mL). A negative result must be combined with clinical observations, patient history, and epidemiological information. The expected result is Negative.  Fact Sheet for Patients:  EntrepreneurPulse.com.au  Fact Sheet for Healthcare Providers:  IncredibleEmployment.be  This test is no t yet approved or cleared by the [place] FDA and  has been authorized for detection and/or diagnosis of SARS-CoV-2 by FDA under an Emergency Use Authorization (EUA). This EUA will remain  in effect (meaning this test can be used) for the duration of the COVID-19 declaration under Section 564(b)(1) of the Act, 21 U.S.C.section 360bbb-3(b)(1), unless the authorization is terminated  or revoked sooner.       Influenza A by PCR NEGATIVE NEGATIVE Final   Influenza B by PCR NEGATIVE NEGATIVE Final    Comment: (NOTE) The Xpert Xpress SARS-CoV-2/FLU/RSV plus  assay is intended as an aid in the diagnosis of influenza from Nasopharyngeal swab specimens and should not be used as a sole basis for treatment. Nasal washings and aspirates are unacceptable for Xpert Xpress SARS-CoV-2/FLU/RSV testing.  Fact Sheet for Patients: EntrepreneurPulse.com.au  Fact Sheet for Healthcare Providers: IncredibleEmployment.be  This test is not yet approved or cleared by the [place] FDA and has been authorized for detection and/or diagnosis of SARS-CoV-2 by FDA under an Emergency Use Authorization (EUA). This EUA will remain in effect (meaning this test can be used) for the duration of the COVID-19 declaration under Section 564(b)(1) of the Act, 21 U.S.C. section 360bbb-3(b)(1), unless the authorization is terminated or revoked.  Performed at [facility], [address] [place], [place] [address]   Blood culture (routine x 2)     Status: None   Collection Time: 11/19/20  6:46 PM   Specimen: BLOOD  Result Value Ref Range Status   Specimen Description BLOOD RIGHT ANTECUBITAL  Final  Special Requests   Final    BOTTLES DRAWN AEROBIC AND ANAEROBIC Blood Culture adequate volume   Culture   Final    NO GROWTH 5 DAYS Performed at [facility], [address] [place], [place] [address]    Report Status 11/24/2020 FINAL  Final  Urine Culture     Status: Abnormal   Collection Time: 11/19/20  9:33 PM   Specimen: Urine, Clean Catch  Result Value Ref Range Status   Specimen Description   Final    URINE, CLEAN CATCH Performed at [facility], [address] [place], [place] [address]    Special Requests   Final    NONE Performed at [facility], [address], [place], [place] [address]    Culture (A)  Final    <10,000 COLONIES/mL INSIGNIFICANT GROWTH Performed at [facility] [address] [place], [place] [address]    Report Status 11/21/2020 FINAL  Final   Culture, fungus without smear     Status: None (Preliminary result)   Collection Time: 11/20/20 11:09 AM   Specimen: CSF; Cerebrospinal Fluid  Result Value Ref Range Status   Specimen Description   Final    CSF Performed at [facility], [address] [place], [place] [address]    Special Requests   Final    NONE Performed at [facility], [address], [place], [place] [address]    Culture   Final    NO FUNGUS ISOLATED AFTER 16 DAYS Performed at [facility], [place] [address], [place], [place] [address]    Report Status PENDING  Incomplete  CSF culture w Gram Stain     Status: None   Collection Time: 11/20/20 11:09 AM   Specimen: PATH Cytology CSF; Cerebrospinal Fluid  Result Value Ref Range Status   Specimen Description   Final    CSF Performed at [facility], [address], [place], [place] [address]    Special Requests   Final    NONE Performed at [facility] [address] [place], [place] [address]    Gram Stain   Final    NO ORGANISMS SEEN WBC SEEN RED BLOOD CELLS PRESENT Performed at [facility], [address], [place], [place] [address]    Culture   Final    NO GROWTH 3 DAYS Performed at [facility], [place] [address], [place], [place] [address]    Report Status 11/23/2020 FINAL  Final  Fungus Culture With Stain     Status: None (Preliminary result)   Collection Time: 11/20/20 11:09 AM   Specimen: PATH Cytology CSF; Cerebrospinal Fluid  Result Value Ref Range Status   Fungus Stain Final report  Final    Comment: (NOTE) Performed At: [facility] [place], [place] [id number] Rush Farmer MD [id number]    Fungus (Mycology) Culture PENDING  Incomplete   Fungal Source CSF  Final    Comment: Performed at [facility], [address] [place], [place] [address]  Fungus Culture Result     Status: None   Collection Time: 11/20/20 11:09 AM  Result Value Ref  Range Status   Result 1 Comment  Final    Comment: (NOTE) KOH/Calcofluor preparation:  no fungus observed. Performed At: [facility] [place], [place] [id number] Rush Farmer MD [id number]   Resp Panel by RT-PCR (Flu A&B, Covid) Nasopharyngeal Swab     Status: None  Collection Time: 11/27/20  1:39 PM   Specimen: Nasopharyngeal Swab; Nasopharyngeal(NP) swabs in vial transport medium  Result Value Ref Range Status   SARS Coronavirus 2 by RT PCR NEGATIVE NEGATIVE Final    Comment: (NOTE) SARS-CoV-2 target nucleic acids are NOT DETECTED.  The SARS-CoV-2 RNA is generally detectable in upper respiratory specimens during the acute phase of infection. The lowest concentration of SARS-CoV-2 viral copies this assay can detect is 138 copies/mL. A negative result does not preclude SARS-Cov-2 infection and should not be used as the sole basis for treatment or other patient management decisions. A negative result may occur with  improper specimen collection/handling, submission of specimen other than nasopharyngeal swab, presence of viral mutation(s) within the areas targeted by this assay, and inadequate number of viral copies(<138 copies/mL). A negative result must be combined with clinical observations, patient history, and epidemiological information. The expected result is Negative.  Fact Sheet for Patients:  EntrepreneurPulse.com.au  Fact Sheet for Healthcare Providers:  IncredibleEmployment.be  This test is no t yet approved or cleared by the [place] FDA and  has been authorized for detection and/or diagnosis of SARS-CoV-2 by FDA under an Emergency Use Authorization (EUA). This EUA will remain  in effect (meaning this test can be used) for the duration of the COVID-19 declaration under Section 564(b)(1) of the Act, 21 U.S.C.section 360bbb-3(b)(1), unless the authorization is terminated  or revoked sooner.        Influenza A by PCR NEGATIVE NEGATIVE Final   Influenza B by PCR NEGATIVE NEGATIVE Final    Comment: (NOTE) The Xpert Xpress SARS-CoV-2/FLU/RSV plus assay is intended as an aid in the diagnosis of influenza from Nasopharyngeal swab specimens and should not be used as a sole basis for treatment. Nasal washings and aspirates are unacceptable for Xpert Xpress SARS-CoV-2/FLU/RSV testing.  Fact Sheet for Patients: EntrepreneurPulse.com.au  Fact Sheet for Healthcare Providers: IncredibleEmployment.be  This test is not yet approved or cleared by the [place] FDA and has been authorized for detection and/or diagnosis of SARS-CoV-2 by FDA under an Emergency Use Authorization (EUA). This EUA will remain in effect (meaning this test can be used) for the duration of the COVID-19 declaration under Section 564(b)(1) of the Act, 21 U.S.C. section 360bbb-3(b)(1), unless the authorization is terminated or revoked.  Performed at [facility], [address], [place], [place] [address]   Urine Culture     Status: Abnormal   Collection Time: 11/27/20  2:15 PM   Specimen: Urine, Clean Catch  Result Value Ref Range Status   Specimen Description   Final    URINE, CLEAN CATCH Performed at [facility], [address], [place], [place] [address]    Special Requests   Final    NONE Performed at [facility], [place],  [address]    Culture 20,000 COLONIES/mL ENTEROBACTER AEROGENES (A)  Final   Report Status 11/30/2020 FINAL  Final   Organism ID, Bacteria ENTEROBACTER AEROGENES (A)  Final      Susceptibility   Enterobacter aerogenes - MIC*    CEFAZOLIN >=64 RESISTANT Resistant     CEFEPIME 1 SENSITIVE Sensitive     CEFTRIAXONE >=64 RESISTANT Resistant     CIPROFLOXACIN <=0.25 SENSITIVE Sensitive     GENTAMICIN <=1 SENSITIVE Sensitive     IMIPENEM <=0.25 SENSITIVE Sensitive     NITROFURANTOIN 64  INTERMEDIATE Intermediate     TRIMETH/SULFA <=20 SENSITIVE Sensitive     PIP/TAZO >=128 RESISTANT Resistant     *  20,000 COLONIES/mL ENTEROBACTER AEROGENES  CULTURE, BLOOD (ROUTINE X 2) w Reflex to ID Panel     Status: None   Collection Time: 11/27/20  8:23 PM   Specimen: BLOOD  Result Value Ref Range Status   Specimen Description BLOOD LAC  Final   Special Requests   Final    BOTTLES DRAWN AEROBIC AND ANAEROBIC Blood Culture results may not be optimal due to an inadequate volume of blood received in culture bottles   Culture   Final    NO GROWTH 5 DAYS Performed at [facility], [address] [place], [name] [address]    Report Status 12/02/2020 FINAL  Final  CULTURE, BLOOD (ROUTINE X 2) w Reflex to ID Panel     Status: None   Collection Time: 11/27/20  9:32 PM   Specimen: BLOOD LEFT HAND  Result Value Ref Range Status   Specimen Description BLOOD LEFT HAND  Final   Special Requests   Final    BOTTLES DRAWN AEROBIC AND ANAEROBIC Blood Culture adequate volume   Culture   Final    NO GROWTH 5 DAYS Performed at [facility], [address], [place], [place] [address]    Report Status 12/02/2020 FINAL  Final    Coagulation Studies: No results for input(s): LABPROT, INR in the last 72 hours.  Urinalysis: No results for input(s): COLORURINE, LABSPEC, PHURINE, GLUCOSEU, HGBUR, BILIRUBINUR, KETONESUR, PROTEINUR, UROBILINOGEN, NITRITE, LEUKOCYTESUR in the last 72 hours.  Invalid input(s): APPERANCEUR     Imaging: DG Chest 1 View  Result Date: 12/04/2020 CLINICAL DATA:  Cough EXAM: CHEST 1 VIEW COMPARISON:  11/27/2020 FINDINGS: Few faint residual patchy opacities are present in the right mid lung. There is persistent basilar atelectasis as well. Asymmetric elevation of the right hemidiaphragm similar to prior. New left effusion. No right effusion. No pneumothorax. Stable cardiomediastinal contours. Right upper extremity PICC tip terminates at the superior  cavoatrial junction. No acute osseous or soft tissue abnormality of the chest wall IMPRESSION: Few faint residual patchy opacities present in the right mid lung, possibly infectious or inflammatory as seen on CT and prior radiograph. New trace left effusion. Right upper extremity PICC terminates at the superior cavoatrial junction. Electronically Signed   By: Lovena Le M.D.   On: 12/04/2020 19:30   US Venous Img Lower Bilateral (DVT)  Result Date: 12/05/2020 CLINICAL DATA:  Fever unknown origin EXAM: BILATERAL LOWER EXTREMITY VENOUS DOPPLER ULTRASOUND TECHNIQUE: Gray-scale sonography with compression, as well as color and duplex ultrasound, were performed to evaluate the deep venous system(s) from the level of the common femoral vein through the popliteal and proximal calf veins. COMPARISON:  None. FINDINGS: VENOUS On the left, normal compressibility of the common femoral, superficial femoral, and popliteal veins, as well as the visualized calf veins. Visualized portions of profunda femoral vein and great saphenous vein unremarkable. No filling defects to suggest DVT on grayscale or color Doppler imaging. Doppler waveforms show normal direction of venous flow, normal respiratory phasicity and response to augmentation. On the right, incomplete compressibility of the proximal popliteal vein with eccentric mural thickening containing linear echogenic regions. There is continued patency of the lumen with flow seen on color Doppler. Common femoral, femoral, and visualized calf veins are unremarkable. OTHER None. Limitations: none IMPRESSION: 1. Negative for acute DVT. 2. 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 Electronically Signed   By: Lucrezia Europe M.D.   On: 12/05/2020 07:25   US Venous Img Upper  Bilat (DVT)  Result Date: 12/05/2020 CLINICAL DATA:  Fever of unknown origin, diabetes, numbness EXAM: BILATERAL UPPER EXTREMITY VENOUS DOPPLER ULTRASOUND  TECHNIQUE: Gray-scale sonography with graded compression, as well as color Doppler and duplex ultrasound were performed to evaluate the bilateral upper extremity deep venous systems from the level of the subclavian vein and including the jugular, axillary, basilic, radial, ulnar and upper cephalic vein. Spectral Doppler was utilized to evaluate flow at rest and with distal augmentation maneuvers. COMPARISON:  None. FINDINGS: RIGHT UPPER EXTREMITY Internal Jugular Vein: No evidence of thrombus. Normal compressibility, respiratory phasicity and response to augmentation. Subclavian Vein: No evidence of thrombus. Normal compressibility, respiratory phasicity and response to augmentation. Axillary Vein: No evidence of thrombus. Normal compressibility, respiratory phasicity and response to augmentation. Cephalic Vein: Not visualized Basilic Vein: No evidence of thrombus. Normal compressibility, respiratory phasicity and response to augmentation. Brachial Veins: No evidence of thrombus. Normal compressibility, respiratory phasicity and response to augmentation. Radial Veins: No evidence of thrombus. Normal compressibility, respiratory phasicity and response to augmentation. Ulnar Veins: No evidence of thrombus. Normal compressibility, respiratory phasicity and response to augmentation. Venous Reflux:  None. Other Findings:  None. LEFT UPPER EXTREMITY Internal Jugular Vein: No evidence of thrombus. Normal compressibility, respiratory phasicity and response to augmentation. Subclavian Vein: No evidence of thrombus. Normal compressibility, respiratory phasicity and response to augmentation. Axillary Vein: No evidence of thrombus. Normal compressibility, respiratory phasicity and response to augmentation. Cephalic Vein: Not visualized Basilic Vein: No evidence of thrombus. Normal compressibility, respiratory phasicity and response to augmentation. Brachial Veins: No evidence of thrombus. Normal compressibility, respiratory  phasicity and response to augmentation. Radial Veins: No evidence of thrombus. Normal compressibility, respiratory phasicity and response to augmentation. Ulnar Veins: No evidence of thrombus. Normal compressibility, respiratory phasicity and response to augmentation. Venous Reflux:  None. Other Findings:  None. IMPRESSION: No evidence of DVT within either upper extremity. Electronically Signed   By: Lucrezia Europe M.D.   On: 12/05/2020 07:28   ECHOCARDIOGRAM COMPLETE  Result Date: 12/05/2020    ECHOCARDIOGRAM REPORT   Patient Name:   YANA SCHORR Date of Exam: 12/05/2020 Medical Rec #:  [id number]       Height:       72.0 in Accession #:    [id number]      Weight:       208.1 lb Date of Birth:  1965/07/14       BSA:          2.167 m Patient Age:    65 years        BP:           136/71 mmHg Patient Gender: F               HR:           80 bpm. Exam Location:  [facility] Procedure: 2D Echo, Color Doppler and Cardiac Doppler Indications:     R50.9 Fever  History:         Patient has prior history of Echocardiogram examinations, most                  recent 06/11/2020. Signs/Symptoms:Fever; Risk                  Factors:Hypertension and Diabetes. Asthma.  Sonographer:     Charmayne Sheer Referring Phys:  [id number] [name] Diagnosing Phys: Yolonda Kida MD  Sonographer Comments: Suboptimal apical window. IMPRESSIONS  1. Left ventricular ejection fraction, by estimation, is 60 to 65%.  The left ventricle has normal function. The left ventricle has no regional wall motion abnormalities. Left ventricular diastolic parameters were normal.  2. Right ventricular systolic function is normal. The right ventricular size is normal. Mildly increased right ventricular wall thickness.  3. The mitral valve is normal in structure. No evidence of mitral valve regurgitation.  4. The aortic valve is normal in structure. Aortic valve regurgitation is not visualized. FINDINGS  Left Ventricle: Left ventricular ejection fraction, by  estimation, is 60 to 65%. The left ventricle has normal function. The left ventricle has no regional wall motion abnormalities. The left ventricular internal cavity size was normal in size. There is  borderline concentric left ventricular hypertrophy. Left ventricular diastolic parameters were normal. Right Ventricle: The right ventricular size is normal. Mildly increased right ventricular wall thickness. Right ventricular systolic function is normal. Left Atrium: Left atrial size was normal in size. Right Atrium: Right atrial size was normal in size. Pericardium: There is no evidence of pericardial effusion. Mitral Valve: The mitral valve is normal in structure. No evidence of mitral valve regurgitation. MV peak gradient, 2.6 mmHg. The mean mitral valve gradient is 1.0 mmHg. Tricuspid Valve: The tricuspid valve is normal in structure. Tricuspid valve regurgitation is not demonstrated. Aortic Valve: The aortic valve is normal in structure. Aortic valve regurgitation is not visualized. Aortic valve mean gradient measures 7.0 mmHg. Aortic valve peak gradient measures 12.5 mmHg. Aortic valve area, by VTI measures 2.59 cm. Pulmonic Valve: The pulmonic valve was normal in structure. Pulmonic valve regurgitation is not visualized. Aorta: The ascending aorta was not well visualized. IAS/Shunts: No atrial level shunt detected by color flow Doppler.  LEFT VENTRICLE PLAX 2D LVIDd:         4.91 cm  Diastology LVIDs:         3.33 cm  LV e' medial:    6.85 cm/s LV PW:         0.94 cm  LV E/e' medial:  10.7 LV IVS:        0.77 cm  LV e' lateral:   7.62 cm/s LVOT diam:     2.10 cm  LV E/e' lateral: 9.6 LV SV:         73 LV SV Index:   34 LVOT Area:     3.46 cm  LEFT ATRIUM             Index LA diam:        3.50 cm 1.62 cm/m LA Vol (A2C):   42.9 ml 19.80 ml/m LA Vol (A4C):   56.8 ml 26.21 ml/m LA Biplane Vol: 51.6 ml 23.81 ml/m  AORTIC VALVE                    PULMONIC VALVE AV Area (Vmax):    2.29 cm     PV Vmax:       1.40  m/s AV Area (Vmean):   2.48 cm     PV Vmean:      92.300 cm/s AV Area (VTI):     2.59 cm     PV VTI:        0.187 m AV Vmax:           177.00 cm/s  PV Peak grad:  7.8 mmHg AV Vmean:          118.000 cm/s PV Mean grad:  4.0 mmHg AV VTI:            0.284 m AV Peak Grad:  12.5 mmHg AV Mean Grad:      7.0 mmHg LVOT Vmax:         117.00 cm/s LVOT Vmean:        84.600 cm/s LVOT VTI:          0.212 m LVOT/AV VTI ratio: 0.75  AORTA Ao Root diam: 2.80 cm MITRAL VALVE MV Area (PHT): 2.74 cm    SHUNTS MV Area VTI:   3.16 cm    Systemic VTI:  0.21 m MV Peak grad:  2.6 mmHg    Systemic Diam: 2.10 cm MV Mean grad:  1.0 mmHg MV Vmax:       0.80 m/s MV Vmean:      56.2 cm/s MV Decel Time: 277 msec MV E velocity: 73.30 cm/s MV A velocity: 66.80 cm/s MV E/A ratio:  1.10 Dwayne D Callwood MD Electronically signed by Yolonda Kida MD Signature Date/Time: 12/05/2020/6:14:29 PM    Final      Medications:    methylPREDNISolone (SOLU-MEDROL) injection 1,000 mg (12/06/20 0842)    Chlorhexidine Gluconate Cloth  6 each Topical Daily   diltiazem  120 mg Oral Daily   feeding supplement  1 Container Oral TID BM   hydrALAZINE  50 mg Oral Q8H   insulin aspart  0-15 Units Subcutaneous TID WC   [START ON 12/07/2020] insulin glargine-yfgn  8 Units Subcutaneous Daily   levothyroxine  100 mcg Oral QAC breakfast   magnesium oxide  800 mg Oral BID   mirtazapine  15 mg Oral QHS   multivitamin with minerals  1 tablet Oral Daily   pantoprazole  40 mg Oral Daily   psyllium  1 packet Oral Daily   rosuvastatin  10 mg Oral QHS   sodium chloride flush  10-40 mL Intracatheter Q12H   thiamine  100 mg Oral Daily   acetaminophen **OR** acetaminophen, albuterol, haloperidol lactate, loperamide, ondansetron **OR** ondansetron (ZOFRAN) IV, polyethylene glycol, sodium chloride flush  Assessment/ Plan:  Tonya Myers is a 55 y.o.  female with medical conditions including pneumococcal meningitis bacteremia, hypothyroidism, Type 2  diabetes, and lymphoma in remission. She presents to the ED with confusion and is admitted for Altered mental status [R41.82] Encephalopathy acute [G93.40] Altered mental status, unspecified altered mental status type [R41.82] Sepsis with encephalopathy without septic shock, due to unspecified organism ([place]) [A41.9, R65.20, G93.40]      1)Renal    Acute kidney injury Patient has acute kidney injury secondary to contrast induced ATN Thereafter patient UA did show proteinuria and hematuria. Autoimmune work-up was done. 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 Patient has a baseline creatinine of 0.5 Patient creatinine is at 3.2 We will hold off on initiating rituximab/specific treatment  till bone marrow biopsy and possibly renal biopsy are available  2)HTN  Blood pressure is stable    3)Anemia of chronic disease and contribution from CLL  CBC Latest Ref Rng & Units 12/06/2020 12/05/2020 12/04/2020  WBC 4.0 - 10.5 K/uL 2.6(L) 3.0(L) 3.6(L)  Hemoglobin 12.0 - 15.0 g/dL 7.9(L) 7.6(L) 7.8(L)  Hematocrit 36.0 - 46.0 % 23.9(L) 22.8(L) 23.4(L)  Platelets 150 - 400 K/uL 35(L) 43(L) 47(L)    Patient has pancytopenia    HGb is not at goal (9--11)   4)  Electrolytes   BMP Latest Ref Rng & Units 12/06/2020  12/05/2020 12/04/2020  Glucose 70 - 99 mg/dL 361(H) 64(L) 79  BUN 6 - 20 mg/dL 31(H) 23(H) 19  Creatinine 0.44 - 1.00 mg/dL 3.22(H) 2.88(H) 2.66(H)  Sodium 135 - 145 mmol/L 135 138 136  Potassium 3.5 - 5.1 mmol/L 4.3 3.6 3.7  Chloride 98 - 111 mmol/L 108 113(H) 109  CO2 22 - 32 mmol/L 16(L) 20(L) 20(L)  Calcium 8.9 - 10.3 mg/dL 7.7(L) 7.7(L) 7.7(L)     Sodium Normonatremic   Potassium Normokalemic    7)Acid base  Co2 is not at goal Patient has  acidosis We will start patient on p.o. bicarb  8) CLL Patient is s/p bone marrow biopsy Hematology and primary team are following     Plan   We will start patient on p.o. bicarb We will continue patient on pulse steroids I had extensive discussion with the patient regarding need/risk/benefit of renal biopsy     LOS: 16 Wesleigh Markovic s [facility] 8/13/20222:37 PM

## 2020-12-06 NOTE — Progress Notes (Signed)
PROGRESS NOTE    Tonya Myers  TUU:[id number] DOB: 1965/12/06 DOA: 11/19/2020 PCP: [name], MD   Brief Narrative:  55 y.o. female with medical history significant for recent pneumococcal meningitis and bacteremia, depression, hypothyroid, IDDM2, presents to the ED for chief concern of altered mentation.  Extended hospitalization.  Etiology of encephalopathy is unclear.  Encephalopathy has resolved and patient has baseline level of mentation however she began to spike intermittent fevers associated with progressive worsening of kidney function.  Nephrology and infectious disease both following.  Patient is on IV antibiotics and intravenous fluids.  Work-up has been unrevealing for infectious cause.  Progressively worsening renal failure.  Serologic work-up ANA positive, ANCA positive, hypocomplementemia.  Suspect autoimmune process, likely vasculitis.  Started on high-dose intravenous pulse steroids on 8/12.   Assessment & Plan:   Principal Problem:   Subacute delirium Active Problems:   DDD (degenerative disc disease), cervical   Aphasia   Anemia of chronic disease   Acute focal neurological deficit   Encephalitis   Altered mental status   Encephalopathy acute  Positive ANA Positive autoimmune serologies Serologic work-up indicative of underlying autoimmune process, likely vasculitis.  Still unable to completely rule out infectious etiology. Plan: Daily kidney function, pending as of 8/13 Solu-Medrol 1 g IV daily x3 days Check blood cultures Monitor vitals and fever curve  Fever, recurrent Hx of epidural and paraspinal abscess daily fevers  repeat covid neg.  No cough or dyspnea to suggest PNA.  Procal increased. MRI brain, spine 8/5, no obvious infection Case discussed at length with ID No clear infectious focus Ciprofloxacin and doxycycline. Continues to have daily fevers Not septic or toxic appearing  For extremity Doppler negative for VTE Inflammatory  markers elevated but downtrending Plan: Monitor vitals and fever curve off of antibiotics Follow-up bone marrow biopsy  AKI --baseline ~0.7.  Urine appeared very dark.   --nephrology consulted --renal US showed medical renal disease Creatinine continues to worsen Plan: Nephrology following for AKI.  Serologic work-up reveals likely underlying autoimmune process.  Creatinine pending today.  Currently no emergent indication for hemodialysis   Acute pancreatitis Unclear etiology No reported history of alcohol intake, no radiographic evidence of gallstones Possible drug-induced however unclear exactly which drug could have driven this Initial abdominal examination negative for tenderness Lipase trending down with IVF, but much slowly than usual. Triglycerides negative Plan: Patient tolerating p.o. intake.  Suspect the pancreatitis is resolved If continued concern for pancreatic abscess versus pseudocyst a contrast-enhanced CT scan will need to be done which currently is not possible due to progressive renal failure   Acute toxic metabolic encephalopathy, resolved Unclear etiology --meningitis ruled out by LP.  CT head neg for new stroke.  Hematology ruled out CLL as the culprit.  No seizure on EEG. --mental status dramatically improved with treatment of pancreatitis and IV thiamine repletion. 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 intake is driven by appetite rather than pain --s/p Diflucan for 3 doses   Insulin-dependent diabetes mellitus Hypoglycemic episodes Patient is on long-acting formulation 50 units daily at home in addition to metformin --A1c 6.5 High-dose steroids started 8/12 Plan: -- Increase glargine 8 units daily --SSI   Hyperlipidemia --cont statin   Hypothyroid --cont Synthroid   HTN --cont cardizem and hydralazine   DVT prophylaxis: TED hose Code Status: Full code Family Communication: [name] [phone] on 8/11 Disposition Plan:Status is: Inpatient  Remains inpatient appropriate because:Inpatient level of care appropriate due to severity of illness  Dispo: The patient is from: Home              Anticipated d/c is to: Home              Patient currently is not medically stable to d/c.   Difficult to place patient No  Encephalopathy resolved.  Serologic work-up indicative of autoimmune process.  On high-dose steroids.     Level of care: Med-Surg  Consultants:  Nephrology ID  Procedures: LP Bone marrow biopsy  Antimicrobials:  Ciprofloxacin   Subjective: Seen and examined.  Sitting comfortably in bed.  More tearful this morning.  Reports she felt more like herself than previous.  Objective: Vitals:   12/05/20 2003 12/06/20 0016 12/06/20 0401 12/06/20 0829  BP: 130/65 128/73 123/81 125/76  Pulse: 82 88 79 80  Resp: 16 16 16 18   Temp: 100.2 F (37.9 C) 100.1 F (37.8 C) 98.3 F (36.8 C) 98.6 F (37 C)  TempSrc: Oral  Oral   SpO2: 97% 97% 99% 99%  Weight:      Height:        Intake/Output Summary (Last 24 hours) at 12/06/2020 1006 Last data filed at 12/06/2020 0610 Gross per 24 hour  Intake 50 ml  Output 700 ml  Net -650 ml   Filed Weights   11/20/20 0507 12/01/20 0920 12/01/20 1730  Weight: 83  kg 94.9 kg 94.4 kg    Examination:  General exam: Tearful Respiratory system: Clear to auscultation. Respiratory effort normal. Cardiovascular system: S1-S2, regular rate and rhythm, no murmurs, no pedal edema  gastrointestinal system: Soft, nontender, nondistended, normal bowel sounds Central nervous system: Alert and oriented. No focal neurological deficits. Extremities: Symmetric 5 x 5 power. Skin: No rashes, lesions or ulcers Psychiatry: Judgement and insight appear normal. Mood & affect tearful.     Data Reviewed: I have personally reviewed following labs and imaging studies  CBC: Recent Labs  Lab 12/01/20 0620 12/02/20 0601 12/03/20 0521 12/03/20 2118 12/04/20 0609 12/05/20 0521  WBC 4.5 4.2 3.4*  --  3.6* 3.0*  NEUTROABS  --  3.1 2.6  --  2.7 1.9  HGB 7.5* 7.4* 6.5* 8.6* 7.8* 7.6*  HCT 22.3* 22.6* 19.7* 25.6* 23.4* 22.8*  MCV 83.2 82.8 83.5  --  81.8 82.0  PLT 65* 63* 52*  --  47* 43*   Basic Metabolic Panel: Recent Labs  Lab 12/01/20 0620 12/02/20 0601 12/03/20 0521 12/04/20 0609 12/05/20 0521  NA 136 137 138 136 138  K 3.9 3.9 3.8 3.7 3.6  CL 109 111 111 109 113*  CO2 20* 21* 22 20* 20*  GLUCOSE 148* 105* 88 79 64*  BUN 15 16 19 19  23*  CREATININE 1.86* 2.06* 2.41* 2.66* 2.88*  CALCIUM 7.5* 7.8* 7.6* 7.7* 7.7*  MG 1.8 2.0 1.8 1.7 1.7   GFR: Estimated  Creatinine Clearance: 28.1 mL/min (A) (by C-G formula based on SCr of 2.88 mg/dL (H)). Liver Function Tests: Recent Labs  Lab 11/30/20 0503 12/03/20 1140  AST 58* 62*  ALT 7 12  ALKPHOS 74 75  BILITOT 0.5 0.6  PROT 5.2* 5.0*  ALBUMIN 1.9* 1.9*   Recent Labs  Lab 12/01/20 0620 12/02/20 0601 12/03/20 0521 12/04/20 0609 12/05/20 0521  LIPASE 270* 227* 190* 149* 130*   No results for input(s): AMMONIA in the last 168 hours. Coagulation Profile: No results for input(s): INR, PROTIME in the last 168 hours. Cardiac Enzymes: Recent Labs  Lab 12/03/20 1140  CKTOTAL 19*   BNP (last 3  results) No results for input(s): PROBNP in the last 8760 hours. HbA1C: No results for input(s): HGBA1C in the last 72 hours. CBG: Recent Labs  Lab 12/05/20 0830 12/05/20 1136 12/05/20 1544 12/05/20 2004 12/06/20 0831  GLUCAP 119* 144* 88 116* 244*   Lipid Profile: No results for input(s): CHOL, HDL, LDLCALC, TRIG, CHOLHDL, LDLDIRECT in the last 72 hours. Thyroid Function Tests: No results for input(s): TSH, T4TOTAL, FREET4, T3FREE, THYROIDAB in the last 72 hours. Anemia Panel: Recent Labs    12/04/20 2029  FERRITIN 807*   Sepsis Labs: Recent Labs  Lab 12/02/20 0601 12/03/20 0521 12/04/20 0609  PROCALCITON 1.74 1.71 2.13    Recent Results (from the past 240 hour(s))  Resp Panel by RT-PCR (Flu A&B, Covid) Nasopharyngeal Swab     Status: None   Collection Time: 11/27/20  1:39 PM   Specimen: Nasopharyngeal Swab; Nasopharyngeal(NP) swabs in vial transport medium  Result Value Ref Range Status   SARS Coronavirus 2 by RT PCR NEGATIVE NEGATIVE Final    Comment: (NOTE) SARS-CoV-2 target nucleic acids are NOT DETECTED.  The SARS-CoV-2 RNA is generally detectable in upper respiratory specimens during the acute phase of infection. The lowest concentration of SARS-CoV-2 viral copies this assay can detect is 138 copies/mL. A negative result does not preclude SARS-Cov-2 infection and should not be used as the sole basis for treatment or other patient management decisions. A negative result may occur with  improper specimen collection/handling, submission of specimen other than nasopharyngeal swab, presence of viral mutation(s) within the areas targeted by this assay, and inadequate number of viral copies(<138 copies/mL). A negative result must be combined with clinical observations, patient history, and epidemiological information. The expected result is Negative.  Fact Sheet for Patients:  EntrepreneurPulse.com.au  Fact Sheet for Healthcare Providers:   IncredibleEmployment.be  This test is no t yet approved or cleared by the [place] FDA and  has been authorized for detection and/or diagnosis of SARS-CoV-2 by FDA under an Emergency Use Authorization (EUA). This EUA will remain  in effect (meaning this test can be used) for the duration of the COVID-19 declaration under Section 564(b)(1) of the Act, 21 U.S.C.section 360bbb-3(b)(1), unless the authorization is terminated  or revoked sooner.       Influenza A by PCR NEGATIVE NEGATIVE Final   Influenza B by PCR NEGATIVE NEGATIVE Final    Comment: (NOTE) The Xpert Xpress SARS-CoV-2/FLU/RSV plus assay is intended as an aid in the diagnosis of influenza from Nasopharyngeal swab specimens and should not be used as a sole basis for treatment. Nasal washings and aspirates are unacceptable for Xpert Xpress SARS-CoV-2/FLU/RSV testing.  Fact Sheet for Patients: EntrepreneurPulse.com.au  Fact Sheet for Healthcare Providers: IncredibleEmployment.be  This test is not yet approved or cleared by the [place] FDA and has been authorized for detection and/or  diagnosis of SARS-CoV-2 by FDA under an Emergency Use Authorization (EUA). This EUA will remain in effect (meaning this test can be used) for the duration of the COVID-19 declaration under Section 564(b)(1) of the Act, 21 U.S.C. section 360bbb-3(b)(1), unless the authorization is terminated or revoked.  Performed at [facility], [address] [place], [place] [address]   Urine Culture     Status: Abnormal   Collection Time: 11/27/20  2:15 PM   Specimen: Urine, Clean Catch  Result Value Ref Range Status   Specimen Description   Final    URINE, CLEAN CATCH Performed at [facility], [address], [place], [place] [address]    Special Requests   Final    NONE Performed at [facility], [place], [place] [address]     Culture 20,000 COLONIES/mL ENTEROBACTER AEROGENES (A)  Final   Report Status 11/30/2020 FINAL  Final   Organism ID, Bacteria ENTEROBACTER AEROGENES (A)  Final      Susceptibility   Enterobacter aerogenes - MIC*    CEFAZOLIN >=64 RESISTANT Resistant     CEFEPIME 1 SENSITIVE Sensitive     CEFTRIAXONE >=64 RESISTANT Resistant     CIPROFLOXACIN <=0.25 SENSITIVE Sensitive     GENTAMICIN <=1 SENSITIVE Sensitive     IMIPENEM <=0.25 SENSITIVE Sensitive     NITROFURANTOIN 64 INTERMEDIATE Intermediate     TRIMETH/SULFA <=20 SENSITIVE Sensitive     PIP/TAZO >=128 RESISTANT Resistant     * 20,000 COLONIES/mL ENTEROBACTER AEROGENES  CULTURE, BLOOD (ROUTINE X 2) w Reflex to ID Panel     Status: None   Collection Time: 11/27/20  8:23 PM   Specimen: BLOOD  Result Value Ref Range Status   Specimen Description BLOOD LAC  Final   Special Requests   Final    BOTTLES DRAWN AEROBIC AND ANAEROBIC Blood Culture results may not be optimal due to an inadequate volume of blood received in culture bottles   Culture   Final    NO GROWTH 5 DAYS Performed at [facility], [address] [place], [place] [address]    Report Status 12/02/2020 FINAL  Final  CULTURE, BLOOD (ROUTINE X 2) w Reflex to ID Panel     Status: None   Collection Time: 11/27/20  9:32 PM   Specimen: BLOOD LEFT HAND  Result Value Ref Range Status   Specimen Description BLOOD LEFT HAND  Final   Special Requests   Final    BOTTLES DRAWN AEROBIC AND ANAEROBIC Blood Culture adequate volume   Culture   Final    NO GROWTH 5 DAYS Performed at [facility], [address] [place], [place] [address]    Report Status 12/02/2020 FINAL  Final         Radiology Studies: DG Chest 1 View  Result Date: 12/04/2020 CLINICAL DATA:  Cough EXAM: CHEST 1 VIEW COMPARISON:  11/27/2020 FINDINGS: Few faint residual patchy opacities are present in the right mid lung. There is persistent basilar atelectasis as well. Asymmetric elevation  of the right hemidiaphragm similar to prior. New left effusion. No right effusion. No pneumothorax. Stable cardiomediastinal contours. Right upper extremity PICC tip terminates at the superior cavoatrial junction. No acute osseous or soft tissue abnormality of the chest wall IMPRESSION: Few faint residual patchy opacities present in the right mid lung, possibly infectious or inflammatory as seen on CT and prior radiograph. New trace left effusion. Right upper extremity PICC terminates at the superior cavoatrial junction. Electronically  Signed   By: Lovena Le M.D.   On: 12/04/2020 19:30   US Venous Img Lower Bilateral (DVT)  Result Date: 12/05/2020 CLINICAL DATA:  Fever unknown origin EXAM: BILATERAL LOWER EXTREMITY VENOUS DOPPLER ULTRASOUND TECHNIQUE: Gray-scale sonography with compression, as well as color and duplex ultrasound, were performed to evaluate the deep venous system(s) from the level of the common femoral vein through the popliteal and proximal calf veins. COMPARISON:  None. FINDINGS: VENOUS On the left, normal compressibility of the common femoral, superficial femoral, and popliteal veins, as well as the visualized calf veins. Visualized portions of profunda femoral vein and great saphenous vein unremarkable. No filling defects to suggest DVT on grayscale or color Doppler imaging. Doppler waveforms show normal direction of venous flow, normal respiratory phasicity and response to augmentation. On the right, incomplete compressibility of the proximal popliteal vein with eccentric mural thickening containing linear echogenic regions. There is continued patency of the lumen with flow seen on color Doppler. Common femoral, femoral, and visualized calf veins are unremarkable. OTHER None. Limitations: none IMPRESSION: 1. Negative for acute DVT. 2. 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 Electronically Signed   By: Lucrezia Europe M.D.   On:  12/05/2020 07:25   US Venous Img Upper Bilat (DVT)  Result Date: 12/05/2020 CLINICAL DATA:  Fever of unknown origin, diabetes, numbness EXAM: BILATERAL UPPER EXTREMITY VENOUS DOPPLER ULTRASOUND TECHNIQUE: Gray-scale sonography with graded compression, as well as color Doppler and duplex ultrasound were performed to evaluate the bilateral upper extremity deep venous systems from the level of the subclavian vein and including the jugular, axillary, basilic, radial, ulnar and upper cephalic vein. Spectral Doppler was utilized to evaluate flow at rest and with distal augmentation maneuvers. COMPARISON:  None. FINDINGS: RIGHT UPPER EXTREMITY Internal Jugular Vein: No evidence of thrombus. Normal compressibility, respiratory phasicity and response to augmentation. Subclavian Vein: No evidence of thrombus. Normal compressibility, respiratory phasicity and response to augmentation. Axillary Vein: No evidence of thrombus. Normal compressibility, respiratory phasicity and response to augmentation. Cephalic Vein: Not visualized Basilic Vein: No evidence of thrombus. Normal compressibility, respiratory phasicity and response to augmentation. Brachial Veins: No evidence of thrombus. Normal compressibility, respiratory phasicity and response to augmentation. Radial Veins: No evidence of thrombus. Normal compressibility, respiratory phasicity and response to augmentation. Ulnar Veins: No evidence of thrombus. Normal compressibility, respiratory phasicity and response to augmentation. Venous Reflux:  None. Other Findings:  None. LEFT UPPER EXTREMITY Internal Jugular Vein: No evidence of thrombus. Normal compressibility, respiratory phasicity and response to augmentation. Subclavian Vein: No evidence of thrombus. Normal compressibility, respiratory phasicity and response to augmentation. Axillary Vein: No evidence of thrombus. Normal compressibility, respiratory phasicity and response to augmentation. Cephalic Vein: Not  visualized Basilic Vein: No evidence of thrombus. Normal compressibility, respiratory phasicity and response to augmentation. Brachial Veins: No evidence of thrombus. Normal compressibility, respiratory phasicity and response to augmentation. Radial Veins: No evidence of thrombus. Normal compressibility, respiratory phasicity and response to augmentation. Ulnar Veins: No evidence of thrombus. Normal compressibility, respiratory phasicity and response to augmentation. Venous Reflux:  None. Other Findings:  None. IMPRESSION: No evidence of DVT within either upper extremity. Electronically Signed   By: Lucrezia Europe M.D.   On: 12/05/2020 07:28   CT BONE MARROW BIOPSY  Result Date: 12/04/2020 CLINICAL DATA:  Chronic lymphocytic leukemia EXAM: CT GUIDED DEEP ILIAC BONE ASPIRATION AND CORE BIOPSY TECHNIQUE: Patient was placed prone on the CT gantry and limited axial scans through the pelvis were  obtained. Appropriate skin entry site was identified. Skin site was marked, prepped with chlorhexidine, draped in usual sterile fashion, and infiltrated locally with 1% lidocaine. Patient was given Versed 2 mg IV for anxiolysis during continuous cardiorespiratory monitoring by the radiology RN, during the procedure time of 11 minutes. Under CT fluoroscopic guidance an 11-gauge Cook trocar bone needle was advanced into the right iliac bone just lateral to the sacroiliac joint. Once needle tip position was confirmed, aspiration and 2 core samples were obtained, submitted to pathology for approval. Post procedure scans show no hematoma or fracture. Patient tolerated procedure well. COMPLICATIONS: COMPLICATIONS none IMPRESSION: 1. Technically successful CT guided right iliac bone core and aspiration biopsy. Electronically Signed   By: Lucrezia Europe M.D.   On: 12/04/2020 15:53   ECHOCARDIOGRAM COMPLETE  Result Date: 12/05/2020    ECHOCARDIOGRAM REPORT   Patient Name:   LIISA PICONE Date of Exam: 12/05/2020 Medical Rec #:  [id number]        Height:       72.0 in Accession #:    [id number]      Weight:       208.1 lb Date of Birth:  1965-12-02       BSA:          2.167 m Patient Age:    26 years        BP:           136/71 mmHg Patient Gender: F               HR:           80 bpm. Exam Location:  [facility] Procedure: 2D Echo, Color Doppler and Cardiac Doppler Indications:     R50.9 Fever  History:         Patient has prior history of Echocardiogram examinations, most                  recent 06/11/2020. Signs/Symptoms:Fever; Risk                  Factors:Hypertension and Diabetes. Asthma.  Sonographer:     Charmayne Sheer Referring Phys:  [id number] [name] Diagnosing Phys: Yolonda Kida MD  Sonographer Comments: Suboptimal apical window. IMPRESSIONS  1. Left ventricular ejection fraction, by estimation, is 60 to 65%. The left ventricle has normal function. The left ventricle has no regional wall motion abnormalities. Left ventricular diastolic parameters were normal.  2. Right ventricular systolic function is normal. The right ventricular size is normal. Mildly increased right ventricular wall thickness.  3. The mitral valve is normal in structure. No evidence of mitral valve regurgitation.  4. The aortic valve is normal in structure. Aortic valve regurgitation is not visualized. FINDINGS  Left Ventricle: Left ventricular ejection fraction, by estimation, is 60 to 65%. The left ventricle has normal function. The left ventricle has no regional wall motion abnormalities. The left ventricular internal cavity size was normal in size. There is  borderline concentric left ventricular hypertrophy. Left ventricular diastolic parameters were normal. Right Ventricle: The right ventricular size is normal. Mildly increased right ventricular wall thickness. Right ventricular systolic function is normal. Left Atrium: Left atrial size was normal in size. Right Atrium: Right atrial size was normal in size. Pericardium: There is no evidence of pericardial  effusion. Mitral Valve: The mitral valve is normal in structure. No evidence of mitral valve regurgitation. MV peak gradient, 2.6 mmHg. The mean mitral valve gradient is 1.0 mmHg. Tricuspid Valve: The tricuspid valve  is normal in structure. Tricuspid valve regurgitation is not demonstrated. Aortic Valve: The aortic valve is normal in structure. Aortic valve regurgitation is not visualized. Aortic valve mean gradient measures 7.0 mmHg. Aortic valve peak gradient measures 12.5 mmHg. Aortic valve area, by VTI measures 2.59 cm. Pulmonic Valve: The pulmonic valve was normal in structure. Pulmonic valve regurgitation is not visualized. Aorta: The ascending aorta was not well visualized. IAS/Shunts: No atrial level shunt detected by color flow Doppler.  LEFT VENTRICLE PLAX 2D LVIDd:         4.91 cm  Diastology LVIDs:         3.33 cm  LV e' medial:    6.85 cm/s LV PW:         0.94 cm  LV E/e' medial:  10.7 LV IVS:        0.77 cm  LV e' lateral:   7.62 cm/s LVOT diam:     2.10 cm  LV E/e' lateral: 9.6 LV SV:         73 LV SV Index:   34 LVOT Area:     3.46 cm  LEFT ATRIUM             Index LA diam:        3.50 cm 1.62 cm/m LA Vol (A2C):   42.9 ml 19.80 ml/m LA Vol (A4C):   56.8 ml 26.21 ml/m LA Biplane Vol: 51.6 ml 23.81 ml/m  AORTIC VALVE                    PULMONIC VALVE AV Area (Vmax):    2.29 cm     PV Vmax:       1.40 m/s AV Area (Vmean):   2.48 cm     PV Vmean:      92.300 cm/s AV Area (VTI):     2.59 cm     PV VTI:        0.187 m AV Vmax:           177.00 cm/s  PV Peak grad:  7.8 mmHg AV Vmean:          118.000 cm/s PV Mean grad:  4.0 mmHg AV VTI:            0.284 m AV Peak Grad:      12.5 mmHg AV Mean Grad:      7.0 mmHg LVOT Vmax:         117.00 cm/s LVOT Vmean:        84.600 cm/s LVOT VTI:          0.212 m LVOT/AV VTI ratio: 0.75  AORTA Ao Root diam: 2.80 cm MITRAL VALVE MV Area (PHT): 2.74 cm    SHUNTS MV Area VTI:   3.16 cm    Systemic VTI:  0.21 m MV Peak grad:  2.6 mmHg    Systemic Diam: 2.10 cm MV  Mean grad:  1.0 mmHg MV Vmax:       0.80 m/s MV Vmean:      56.2 cm/s MV Decel Time: 277 msec MV E velocity: 73.30 cm/s MV A velocity: 66.80 cm/s MV E/A ratio:  1.10 Dwayne D Callwood MD Electronically signed by Yolonda Kida MD Signature Date/Time: 12/05/2020/6:14:29 PM    Final         Scheduled Meds:  Chlorhexidine Gluconate Cloth  6 each Topical Daily   dextrose  12.5 g Intravenous Once   diltiazem  120 mg Oral Daily  feeding supplement  1 Container Oral TID BM   hydrALAZINE  50 mg Oral Q8H   insulin aspart  0-15 Units Subcutaneous TID WC   insulin glargine-yfgn  5 Units Subcutaneous Daily   levothyroxine  100 mcg Oral QAC breakfast   magnesium oxide  800 mg Oral BID   mirtazapine  15 mg Oral QHS   multivitamin with minerals  1 tablet Oral Daily   pantoprazole  40 mg Oral Daily   psyllium  1 packet Oral Daily   rosuvastatin  10 mg Oral QHS   sodium chloride flush  10-40 mL Intracatheter Q12H   thiamine  100 mg Oral Daily   Continuous Infusions:  methylPREDNISolone (SOLU-MEDROL) injection 1,000 mg (12/06/20 0842)      LOS: 16 days    Time spent: 25 minutes    Sidney Ace, MD [facility] Pager [phone]  If 7PM-7AM, please contact night-coverage 12/06/2020, 10:06 AM

## 2020-12-07 ENCOUNTER — Other Ambulatory Visit: Payer: Self-pay | Admitting: Physical Medicine and Rehabilitation

## 2020-12-07 DIAGNOSIS — N179 Acute kidney failure, unspecified: Secondary | ICD-10-CM | POA: Diagnosis not present

## 2020-12-07 DIAGNOSIS — R509 Fever, unspecified: Secondary | ICD-10-CM | POA: Diagnosis not present

## 2020-12-07 LAB — CBC WITH DIFFERENTIAL/PLATELET
Abs Immature Granulocytes: 0.02 10*3/uL (ref 0.00–0.07)
Basophils Absolute: 0 10*3/uL (ref 0.0–0.1)
Basophils Relative: 0 %
Eosinophils Absolute: 0 10*3/uL (ref 0.0–0.5)
Eosinophils Relative: 0 %
HCT: 21.8 % — ABNORMAL LOW (ref 36.0–46.0)
Hemoglobin: 7.4 g/dL — ABNORMAL LOW (ref 12.0–15.0)
Immature Granulocytes: 0 %
Lymphocytes Relative: 16 %
Lymphs Abs: 0.8 10*3/uL (ref 0.7–4.0)
MCH: 26.9 pg (ref 26.0–34.0)
MCHC: 33.9 g/dL (ref 30.0–36.0)
MCV: 79.3 fL — ABNORMAL LOW (ref 80.0–100.0)
Monocytes Absolute: 0.2 10*3/uL (ref 0.1–1.0)
Monocytes Relative: 4 %
Neutro Abs: 4.1 10*3/uL (ref 1.7–7.7)
Neutrophils Relative %: 80 %
Platelets: 34 10*3/uL — ABNORMAL LOW (ref 150–400)
RBC: 2.75 MIL/uL — ABNORMAL LOW (ref 3.87–5.11)
RDW: 16.6 % — ABNORMAL HIGH (ref 11.5–15.5)
WBC: 5.1 10*3/uL (ref 4.0–10.5)
nRBC: 0 % (ref 0.0–0.2)

## 2020-12-07 LAB — BPAM RBC
Blood Product Expiration Date: 202208232359
Blood Product Expiration Date: 202209022359
Blood Product Expiration Date: 202209022359
ISSUE DATE / TIME: 202208101646
Unit Type and Rh: 5100
Unit Type and Rh: 5100
Unit Type and Rh: 9500

## 2020-12-07 LAB — TYPE AND SCREEN
ABO/RH(D): O POS
Antibody Screen: POSITIVE
DAT, IgG: POSITIVE
DAT, complement: NEGATIVE
Unit division: 0
Unit division: 0
Unit division: 0

## 2020-12-07 LAB — GLUCOSE, CAPILLARY
Glucose-Capillary: 375 mg/dL — ABNORMAL HIGH (ref 70–99)
Glucose-Capillary: 391 mg/dL — ABNORMAL HIGH (ref 70–99)
Glucose-Capillary: 382 mg/dL — ABNORMAL HIGH (ref 70–99)
Glucose-Capillary: 361 mg/dL — ABNORMAL HIGH (ref 70–99)

## 2020-12-07 LAB — BASIC METABOLIC PANEL
Anion gap: 5 (ref 5–15)
BUN: 42 mg/dL — ABNORMAL HIGH (ref 6–20)
CO2: 18 mmol/L — ABNORMAL LOW (ref 22–32)
Calcium: 7.6 mg/dL — ABNORMAL LOW (ref 8.9–10.3)
Chloride: 109 mmol/L (ref 98–111)
Creatinine, Ser: 3.38 mg/dL — ABNORMAL HIGH (ref 0.44–1.00)
GFR, Estimated: 15 mL/min — ABNORMAL LOW (ref >60)
Glucose, Bld: 395 mg/dL — ABNORMAL HIGH (ref 70–99)
Potassium: 4.5 mmol/L (ref 3.5–5.1)
Sodium: 132 mmol/L — ABNORMAL LOW (ref 135–145)

## 2020-12-07 LAB — PREPARE RBC (CROSSMATCH)

## 2020-12-07 LAB — LIPASE, BLOOD: Lipase: 135 U/L — ABNORMAL HIGH (ref 11–51)

## 2020-12-07 NOTE — Progress Notes (Signed)
PT Cancellation Note  Patient Details Name: Tonya Myers MRN: [id number] DOB: 1966-02-21   Cancelled Treatment:    Reason Eval/Treat Not Completed: Patient declined, no reason specified Attempted to see pt for PT tx. Pt received in bed with aunt present. Pt stating "that's not happening today. Today is my birthday and I'm celebrating with my family." Will f/u as able & as pt is willing to participate.  Lavone Nian, PT, DPT 12/07/20, 1:38 PM    Waunita Schooner 12/07/2020, 1:37 PM

## 2020-12-07 NOTE — Progress Notes (Signed)
PROGRESS NOTE    Tonya Myers  KDT:[id number] DOB: August 06, 1965 DOA: 11/19/2020 PCP: Tonya Pink, MD   Brief Narrative:  55 y.o. female with medical history significant for recent pneumococcal meningitis and bacteremia, depression, hypothyroid, IDDM2, presents to the ED for chief concern of altered mentation.  Extended hospitalization.  Etiology of encephalopathy is unclear.  Encephalopathy has resolved and patient has baseline level of mentation however she began to spike intermittent fevers associated with progressive worsening of kidney function.  Nephrology and infectious disease both following.  Patient is on IV antibiotics and intravenous fluids.  Work-up has been unrevealing for infectious cause.  Progressively worsening renal failure.  Serologic work-up ANA positive, ANCA positive, hypocomplementemia.  Suspect autoimmune process, likely vasculitis.  Started on high-dose intravenous pulse steroids on 8/12.   Assessment & Plan:   Principal Problem:   Subacute delirium Active Problems:   DDD (degenerative disc disease), cervical   Aphasia   Anemia of chronic disease   Acute focal neurological deficit   Encephalitis   Altered mental status   Encephalopathy acute  Positive ANA Positive autoimmune serologies Serologic work-up indicative of underlying autoimmune process, likely vasculitis.  Still unable to completely rule out infectious etiology. Plan: Daily kidney function, creat continues to worsen Solu-Medrol 1 g IV daily x3 days (day 3/3) Check blood cultures, drawn 8/14, pending Monitor vitals and fever curve  Fever, recurrent Hx of epidural and paraspinal abscess daily fevers  repeat covid neg.  No cough or dyspnea to suggest PNA.  Procal increased. MRI brain, spine 8/5, no obvious infection Case discussed at length with ID No clear infectious focus Ciprofloxacin and doxycycline. Continues to have daily fevers For extremity Doppler negative for VTE Inflammatory  markers elevated but downtrending Last documented fever 101.8 on 8/12 Plan: Monitor vitals and fever curve off of antibiotics Follow-up bone marrow biopsy, pending  AKI --baseline ~0.7.  Urine appeared very dark.   --nephrology consulted --renal US showed medical renal disease Creatinine continues to worsen Plan: Nephrology following for AKI.  Serologic work-up reveals likely underlying autoimmune process.  Creat worsening, will likely need kidney biopsy   Acute pancreatitis Unclear etiology No reported history of alcohol intake, no radiographic evidence of gallstones Possible drug-induced however unclear exactly which drug could have driven this Initial abdominal examination negative for tenderness Lipase trending down with IVF, but much slowly than usual. Triglycerides negative Plan: Patient tolerating p.o. intake.  Suspect the pancreatitis is resolved If continued concern for pancreatic abscess versus pseudocyst a contrast-enhanced CT scan will need to be done which currently is not possible due to progressive renal failure   Acute toxic metabolic encephalopathy, resolved Unclear etiology --meningitis ruled out by LP.  CT head neg for new stroke.  Hematology ruled out CLL as the culprit.  No seizure on EEG. --mental status dramatically improved with treatment of pancreatitis and IV thiamine repletion. 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 intake is driven by appetite rather than pain --s/p Diflucan for 3 doses   Insulin-dependent diabetes mellitus Hypoglycemic episodes Patient is on long-acting formulation 50 units daily at home in addition to metformin --A1c 6.5 High-dose steroids started 8/12 Plan: -- Increase glargine 8 units daily --SSI   Hyperlipidemia --cont statin   Hypothyroid --cont Synthroid   HTN --cont cardizem and hydralazine   DVT prophylaxis: TED hose Code Status: Full code Family Communication: Tonya Myers [phone] on 8/13 Disposition Plan:Status is: Inpatient  Remains inpatient appropriate because:Inpatient level of care appropriate due to severity of illness  Dispo: The patient is from: Home              Anticipated d/c is to: Home              Patient currently is not medically stable to d/c.   Difficult to place patient No  Encephalopathy resolved.  Serologic work-up indicative of autoimmune process.  On high-dose steroids.  Creat worsening     Level of care: Med-Surg  Consultants:  Nephrology ID  Procedures: LP Bone marrow biopsy  Antimicrobials:  Ciprofloxacin   Subjective: Seen and examined.  Sitting comfortably in bed.  Stable this AM.  No complaints  Objective: Vitals:   12/06/20 1959 12/07/20 0004 12/07/20 0512 12/07/20 0802  BP: 134/82 122/70 117/74 115/63  Pulse: 69 64 (!) 59 62  Resp: 18 18 18 18   Temp: 98.1 F (36.7 C) 97.6 F (36.4 C) 97.9 F (36.6 C) 97.6 F (36.4 C)  TempSrc:      SpO2: 100% 98% 97% 98%  Weight:      Height:        Intake/Output Summary (Last 24 hours) at 12/07/2020 0955 Last data filed at 12/07/2020 0925 Gross per 24 hour  Intake 736 ml  Output 400 ml  Net 336 ml   Filed Weights   11/20/20 0507 12/01/20 0920 12/01/20 1730  Weight: 83 kg 94.9 kg  94.4 kg    Examination:  General exam: NAD Respiratory system: Clear to auscultation. Respiratory effort normal. Cardiovascular system: S1-S2, regular rate and rhythm, no murmurs, no pedal edema  gastrointestinal system: Soft, nontender, nondistended, normal bowel sounds Central nervous system: Alert and oriented. No focal neurological deficits. Extremities: Symmetric 5 x 5 power. Skin: No rashes, lesions or ulcers Psychiatry: Judgement and insight appear normal. Mood & affect flattened.     Data Reviewed: I have personally reviewed following labs and imaging studies  CBC: Recent Labs  Lab 12/03/20 0521 12/03/20 2118 12/04/20 0609 12/05/20 0521 12/06/20 1035 12/07/20 0511  WBC 3.4*  --  3.6* 3.0* 2.6* 5.1  NEUTROABS 2.6  --  2.7 1.9 2.1 4.1  HGB 6.5* 8.6* 7.8* 7.6* 7.9* 7.4*  HCT 19.7* 25.6* 23.4* 22.8* 23.9* 21.8*  MCV 83.5  --  81.8 82.0 81.8 79.3*  PLT 52*  --  47* 43* 35* 34*   Basic Metabolic Panel: Recent Labs  Lab 12/01/20 0620 12/02/20 0601 12/03/20 0521 12/04/20 0609 12/05/20 0521 12/06/20 1035 12/07/20 0511  NA 136 137 138 136 138 135 132*  K 3.9 3.9 3.8 3.7 3.6 4.3 4.5  CL 109 111 111 109 113* 108 109  CO2 20* 21* 22 20* 20* 16* 18*  GLUCOSE 148* 105* 88 79 64* 361* 395*  BUN 15 16 19 19  23* 31* 42*  CREATININE 1.86* 2.06* 2.41* 2.66* 2.88* 3.22* 3.38*  CALCIUM  7.5* 7.8* 7.6* 7.7* 7.7* 7.7* 7.6*  MG 1.8 2.0 1.8 1.7 1.7  --   --    GFR: Estimated Creatinine Clearance: 23.7 mL/min (A) (by C-G formula based on SCr of 3.38 mg/dL (H)). Liver Function Tests: Recent Labs  Lab 12/03/20 1140  AST 62*  ALT 12  ALKPHOS 75  BILITOT 0.6  PROT 5.0*  ALBUMIN 1.9*   Recent Labs  Lab 12/03/20 0521 12/04/20 0609 12/05/20 0521 12/06/20 1035 12/07/20 0511  LIPASE 190* 149* 130* 90* 135*   No results for input(s): AMMONIA in the last 168 hours. Coagulation Profile: No results for input(s): INR, PROTIME in the last 168 hours. Cardiac  Enzymes: Recent Labs  Lab 12/03/20 1140  CKTOTAL 19*   BNP (last 3 results) No results for input(s): PROBNP in the last 8760 hours. HbA1C: No results for input(s): HGBA1C in the last 72 hours. CBG: Recent Labs  Lab 12/05/20 2004 12/06/20 0831 12/06/20 1220 12/06/20 1610 12/07/20 0842  GLUCAP 116* 244* 351* 441* 375*   Lipid Profile: No results for input(s): CHOL, HDL, LDLCALC, TRIG, CHOLHDL, LDLDIRECT in the last 72 hours. Thyroid Function Tests: No results for input(s): TSH, T4TOTAL, FREET4, T3FREE, THYROIDAB in the last 72 hours. Anemia Panel: Recent Labs    12/04/20 2029  FERRITIN 807*   Sepsis Labs: Recent Labs  Lab 12/02/20 0601 12/03/20 0521 12/04/20 0609  PROCALCITON 1.74 1.71 2.13    Recent Results (from the past 240 hour(s))  Resp Panel by RT-PCR (Flu A&B, Covid) Nasopharyngeal Swab     Status: None   Collection Time: 11/27/20  1:39 PM   Specimen: Nasopharyngeal Swab; Nasopharyngeal(NP) swabs in vial transport medium  Result Value Ref Range Status   SARS Coronavirus 2 by RT PCR NEGATIVE NEGATIVE Final    Comment: (NOTE) SARS-CoV-2 target nucleic acids are NOT DETECTED.  The SARS-CoV-2 RNA is generally detectable in upper respiratory specimens during the acute phase of infection. The lowest concentration of SARS-CoV-2 viral copies this assay can detect is 138 copies/mL. A negative result does not preclude SARS-Cov-2 infection and should not be used as the sole basis for treatment or other patient management decisions. A negative result may occur with  improper specimen collection/handling, submission of specimen other than nasopharyngeal swab, presence of viral mutation(s) within the areas targeted by this assay, and inadequate number of viral copies(<138 copies/mL). A negative result must be combined with clinical observations, patient history, and epidemiological information. The expected result is Negative.  Fact Sheet for Patients:   EntrepreneurPulse.com.au  Fact Sheet for Healthcare Providers:  IncredibleEmployment.be  This test is no t yet approved or cleared by the [place] FDA and  has been authorized for detection and/or diagnosis of SARS-CoV-2 by FDA under an Emergency Use Authorization (EUA). This EUA will remain  in effect (meaning this test can be used) for the duration of the COVID-19 declaration under Section 564(b)(1) of the Act, 21 U.S.C.section 360bbb-3(b)(1), unless the authorization is terminated  or revoked sooner.       Influenza A by PCR NEGATIVE NEGATIVE Final   Influenza B by PCR NEGATIVE NEGATIVE Final    Comment: (NOTE) The Xpert Xpress SARS-CoV-2/FLU/RSV plus assay is intended as an aid in the diagnosis of influenza from Nasopharyngeal swab specimens and should not be used as a sole basis for treatment. Nasal washings and aspirates are unacceptable for Xpert Xpress SARS-CoV-2/FLU/RSV testing.  Fact Sheet for Patients: EntrepreneurPulse.com.au  Fact Sheet for Healthcare Providers: IncredibleEmployment.be  This test is not  yet approved or cleared by the [place] and has been authorized for detection and/or diagnosis of SARS-CoV-2 by FDA under an Emergency Use Authorization (EUA). This EUA will remain in effect (meaning this test can be used) for the duration of the COVID-19 declaration under Section 564(b)(1) of the Act, 21 U.S.C. section 360bbb-3(b)(1), unless the authorization is terminated or revoked.  Performed at [facility], [address] [place], [place] [address]   Urine Culture     Status: Abnormal   Collection Time: 11/27/20  2:15 PM   Specimen: Urine, Clean Catch  Result Value Ref Range Status   Specimen Description   Final    URINE, CLEAN CATCH Performed at [facility], [address], [place], [place] [address]    Special Requests   Final     NONE Performed at [facility], [place], [place] [address]    Culture 20,000 COLONIES/mL ENTEROBACTER AEROGENES (A)  Final   Report Status 11/30/2020 FINAL  Final   Organism ID, Bacteria ENTEROBACTER AEROGENES (A)  Final      Susceptibility   Enterobacter aerogenes - MIC*    CEFAZOLIN >=64 RESISTANT Resistant     CEFEPIME 1 SENSITIVE Sensitive     CEFTRIAXONE >=64 RESISTANT Resistant     CIPROFLOXACIN <=0.25 SENSITIVE Sensitive     GENTAMICIN <=1 SENSITIVE Sensitive     IMIPENEM <=0.25 SENSITIVE Sensitive     NITROFURANTOIN 64 INTERMEDIATE Intermediate     TRIMETH/SULFA <=20 SENSITIVE Sensitive     PIP/TAZO >=128 RESISTANT Resistant     * 20,000 COLONIES/mL ENTEROBACTER AEROGENES  CULTURE, BLOOD (ROUTINE X 2) w Reflex to ID Panel     Status: None   Collection Time: 11/27/20  8:23 PM   Specimen: BLOOD  Result Value Ref Range Status   Specimen Description BLOOD LAC  Final   Special Requests   Final    BOTTLES DRAWN AEROBIC AND ANAEROBIC Blood Culture results may not be optimal due to an inadequate volume of blood received in culture bottles   Culture   Final    NO GROWTH 5 DAYS Performed at [facility], [address] [place], [place] [address]    Report Status 12/02/2020 FINAL  Final  CULTURE, BLOOD (ROUTINE X 2) w Reflex to ID Panel     Status: None   Collection Time: 11/27/20  9:32 PM   Specimen: BLOOD LEFT HAND  Result Value Ref Range Status   Specimen Description BLOOD LEFT HAND  Final   Special Requests   Final    BOTTLES DRAWN AEROBIC AND ANAEROBIC Blood Culture adequate volume   Culture   Final    NO GROWTH 5 DAYS Performed at [facility], [address] [place], [place] [address]    Report Status 12/02/2020 FINAL  Final  CULTURE, BLOOD (ROUTINE X 2) w Reflex to ID Panel     Status: None (Preliminary result)   Collection Time: 12/06/20 10:35 AM   Specimen: BLOOD  Result Value Ref Range Status   Specimen Description  BLOOD LT KNUCKLE  Final   Special Requests   Final    BOTTLES DRAWN AEROBIC AND ANAEROBIC Blood Culture adequate volume   Culture   Final    NO GROWTH < 24 HOURS Performed at [facility], [address] [place], [place] [address]    Report Status PENDING  Incomplete  CULTURE, BLOOD (ROUTINE X 2) w Reflex to ID Panel  Status: None (Preliminary result)   Collection Time: 12/06/20 10:35 AM   Specimen: BLOOD  Result Value Ref Range Status   Specimen Description BLOOD BLOOD LEFT WRIST  Final   Special Requests AEROBIC BOTTLE ONLY Blood Culture adequate volume  Final   Culture   Final    NO GROWTH < 24 HOURS Performed at [facility], [address] [place], [place] [address]    Report Status PENDING  Incomplete         Radiology Studies: ECHOCARDIOGRAM COMPLETE  Result Date: 12/05/2020    ECHOCARDIOGRAM REPORT   Patient Name:   DENISIA HARPOLE Date of Exam: 12/05/2020 Medical Rec #:  [id number]       Height:       72.0 in Accession #:    [id number]      Weight:       208.1 lb Date of Birth:  02-20-1966       BSA:          2.167 m Patient Age:    60 years        BP:           136/71 mmHg Patient Gender: F               HR:           80 bpm. Exam Location:  [facility] Procedure: 2D Echo, Color Doppler and Cardiac Doppler Indications:     R50.9 Fever  History:         Patient has prior history of Echocardiogram examinations, most                  recent 06/11/2020. Signs/Symptoms:Fever; Risk                  Factors:Hypertension and Diabetes. Asthma.  Sonographer:     Charmayne Sheer Referring Phys:  [id number] [name] Diagnosing Phys: Yolonda Kida MD  Sonographer Comments: Suboptimal apical window. IMPRESSIONS  1. Left ventricular ejection fraction, by estimation, is 60 to 65%. The left ventricle has normal function. The left ventricle has no regional wall motion abnormalities. Left ventricular diastolic parameters were normal.  2. Right ventricular systolic function  is normal. The right ventricular size is normal. Mildly increased right ventricular wall thickness.  3. The mitral valve is normal in structure. No evidence of mitral valve regurgitation.  4. The aortic valve is normal in structure. Aortic valve regurgitation is not visualized. FINDINGS  Left Ventricle: Left ventricular ejection fraction, by estimation, is 60 to 65%. The left ventricle has normal function. The left ventricle has no regional wall motion abnormalities. The left ventricular internal cavity size was normal in size. There is  borderline concentric left ventricular hypertrophy. Left ventricular diastolic parameters were normal. Right Ventricle: The right ventricular size is normal. Mildly increased right ventricular wall thickness. Right ventricular systolic function is normal. Left Atrium: Left atrial size was normal in size. Right Atrium: Right atrial size was normal in size. Pericardium: There is no evidence of pericardial effusion. Mitral Valve: The mitral valve is normal in structure. No evidence of mitral valve regurgitation. MV peak gradient, 2.6 mmHg. The mean mitral valve gradient is 1.0 mmHg. Tricuspid Valve: The tricuspid valve is normal in structure. Tricuspid valve regurgitation is not demonstrated. Aortic Valve: The aortic valve is normal in structure. Aortic valve regurgitation is not visualized. Aortic valve mean gradient measures 7.0 mmHg. Aortic valve peak gradient measures 12.5 mmHg. Aortic valve area, by VTI measures 2.59 cm.  Pulmonic Valve: The pulmonic valve was normal in structure. Pulmonic valve regurgitation is not visualized. Aorta: The ascending aorta was not well visualized. IAS/Shunts: No atrial level shunt detected by color flow Doppler.  LEFT VENTRICLE PLAX 2D LVIDd:         4.91 cm  Diastology LVIDs:         3.33 cm  LV e' medial:    6.85 cm/s LV PW:         0.94 cm  LV E/e' medial:  10.7 LV IVS:        0.77 cm  LV e' lateral:   7.62 cm/s LVOT diam:     2.10 cm  LV E/e'  lateral: 9.6 LV SV:         73 LV SV Index:   34 LVOT Area:     3.46 cm  LEFT ATRIUM             Index LA diam:        3.50 cm 1.62 cm/m LA Vol (A2C):   42.9 ml 19.80 ml/m LA Vol (A4C):   56.8 ml 26.21 ml/m LA Biplane Vol: 51.6 ml 23.81 ml/m  AORTIC VALVE                    PULMONIC VALVE AV Area (Vmax):    2.29 cm     PV Vmax:       1.40 m/s AV Area (Vmean):   2.48 cm     PV Vmean:      92.300 cm/s AV Area (VTI):     2.59 cm     PV VTI:        0.187 m AV Vmax:           177.00 cm/s  PV Peak grad:  7.8 mmHg AV Vmean:          118.000 cm/s PV Mean grad:  4.0 mmHg AV VTI:            0.284 m AV Peak Grad:      12.5 mmHg AV Mean Grad:      7.0 mmHg LVOT Vmax:         117.00 cm/s LVOT Vmean:        84.600 cm/s LVOT VTI:          0.212 m LVOT/AV VTI ratio: 0.75  AORTA Ao Root diam: 2.80 cm MITRAL VALVE MV Area (PHT): 2.74 cm    SHUNTS MV Area VTI:   3.16 cm    Systemic VTI:  0.21 m MV Peak grad:  2.6 mmHg    Systemic Diam: 2.10 cm MV Mean grad:  1.0 mmHg MV Vmax:       0.80 m/s MV Vmean:      56.2 cm/s MV Decel Time: 277 msec MV E velocity: 73.30 cm/s MV A velocity: 66.80 cm/s MV E/A ratio:  1.10 Dwayne D Callwood MD Electronically signed by Yolonda Kida MD Signature Date/Time: 12/05/2020/6:14:29 PM    Final         Scheduled Meds:  Chlorhexidine Gluconate Cloth  6 each Topical Daily   diltiazem  120 mg Oral Daily   feeding supplement  1 Container Oral TID BM   hydrALAZINE  50 mg Oral Q8H   insulin aspart  0-15 Units Subcutaneous TID WC   insulin aspart  5 Units Subcutaneous TID WC   insulin glargine-yfgn  12 Units Subcutaneous BID   levothyroxine  100 mcg Oral QAC breakfast  magnesium oxide  800 mg Oral BID   mirtazapine  15 mg Oral QHS   multivitamin with minerals  1 tablet Oral Daily   pantoprazole  40 mg Oral Daily   psyllium  1 packet Oral Daily   rosuvastatin  10 mg Oral QHS   sodium bicarbonate  650 mg Oral TID   sodium chloride flush  10-40 mL Intracatheter Q12H   thiamine   100 mg Oral Daily   Continuous Infusions:  methylPREDNISolone (SOLU-MEDROL) injection 1,000 mg (12/07/20 0912)      LOS: 17 days    Time spent: 25 minutes    Sidney Ace, MD [facility] Pager [phone]  If 7PM-7AM, please contact night-coverage 12/07/2020, 9:55 AM

## 2020-12-07 NOTE — Progress Notes (Signed)
[facility]  ROUNDING NOTE   Subjective:   Tonya Myers is a 55 y.o. female with medical conditions including pneumococcal meningitis bacteremia, hypothyroidism, Type 2 diabetes, and lymphoma in remission. She presents to the ED with confusion and is admitted for Altered mental status [R41.82] Encephalopathy acute [G93.40] Altered mental status, unspecified altered mental status type [R41.82] Sepsis with encephalopathy without septic shock, due to unspecified organism ([place]) [A41.9, R65.20, G93.40]  Patient is seen resting in the bed today Patient was seen on the first floor today. Patient husband was present in the room Patient offers no new physical complaints. Patient main concern today was that it was her birthday and she was looking forward to seeing her family. Patient told me that she was not wanting to talk if I had bad news.  I then discussed with the patient's husband and informed him that we will most likely need to go ahead with kidney biopsy and we will discuss it in the morning as per patient .    Objective:  Vital signs in last 24 hours:  Temp:  [97.6 F (36.4 C)-98.5 F (36.9 C)] 97.6 F (36.4 C) (08/14 0802) Pulse Rate:  [59-79] 62 (08/14 0802) Resp:  [18] 18 (08/14 0802) BP: (115-134)/(63-82) 115/63 (08/14 0802) SpO2:  [97 %-100 %] 98 % (08/14 0802)  Weight change:  Filed Weights   11/20/20 0507 12/01/20 0920 12/01/20 1730  Weight: 83 kg 94.9 kg 94.4 kg    Intake/Output: I/O last 3 completed shifts: In: 14 [P.O.:660; IV Piggyback:116] Out: 1100 [Urine:1100]   Intake/Output this shift:  Total I/O In: 10 [I.V.:10] Out: -   Physical Exam: General: NAD, resting in bed  Head: Normocephalic, atraumatic. Moist oral mucosal membranes  Eyes: Anicteric  Lungs:  Clear to auscultation, normal effort  Heart: Regular rate and rhythm  Abdomen:  Soft, nontender  Extremities: trace peripheral edema.  Neurologic: Alert, moving all four  extremities  Skin: No lesions       Basic Metabolic Panel: Recent Labs  Lab 12/01/20 0620 12/02/20 0601 12/03/20 0521 12/04/20 0609 12/05/20 0521 12/06/20 1035 12/07/20 0511  NA 136 137 138 136 138 135 132*  K 3.9 3.9 3.8 3.7 3.6 4.3 4.5  CL 109 111 111 109 113* 108 109  CO2 20* 21* 22 20* 20* 16* 18*  GLUCOSE 148* 105* 88 79 64* 361* 395*  BUN _0 23* 31* 42*  CREATININE 1.86* 2.06* 2.41* 2.66* 2.88* 3.22* 3.38*  CALCIUM 7.5* 7.8* 7.6* 7.7* 7.7* 7.7* 7.6*  MG 1.8 2.0 1.8 1.7 1.7  --   --     Liver Function Tests: Recent Labs  Lab 12/03/20 1140  AST 62*  ALT 12  ALKPHOS 75  BILITOT 0.6  PROT 5.0*  ALBUMIN 1.9*   Recent Labs  Lab 12/03/20 0521 12/04/20 0609 12/05/20 0521 12/06/20 1035 12/07/20 0511  LIPASE 190* 149* 130* 90* 135*   No results for input(s): AMMONIA in the last 168 hours.  CBC: Recent Labs  Lab 12/03/20 0521 12/03/20 2118 12/04/20 0609 12/05/20 0521 12/06/20 1035 12/07/20 0511  WBC 3.4*  --  3.6* 3.0* 2.6* 5.1  NEUTROABS 2.6  --  2.7 1.9 2.1 4.1  HGB 6.5* 8.6* 7.8* 7.6* 7.9* 7.4*  HCT 19.7* 25.6* 23.4* 22.8* 23.9* 21.8*  MCV 83.5  --  81.8 82.0 81.8 79.3*  PLT 52*  --  47* 43* 35* 34*    Cardiac Enzymes: Recent Labs  Lab 12/03/20 1140  CKTOTAL 19*  BNP: Invalid input(s): POCBNP  CBG: Recent Labs  Lab 12/05/20 2004 12/06/20 0831 12/06/20 1220 12/06/20 1610 12/07/20 0842  GLUCAP 116* 244* 351* 441* 375*    Microbiology: Results for orders placed or performed during the hospital encounter of 11/19/20  Blood culture (routine x 2)     Status: None   Collection Time: 11/19/20  4:47 PM   Specimen: BLOOD  Result Value Ref Range Status   Specimen Description BLOOD RIGHT ANTECUBITAL  Final   Special Requests   Final    BOTTLES DRAWN AEROBIC AND ANAEROBIC Blood Culture adequate volume   Culture   Final    NO GROWTH 5 DAYS Performed at [facility], [address] [place], [place] [address]     Report Status 11/24/2020 FINAL  Final  Resp Panel by RT-PCR (Flu A&B, Covid) Nasopharyngeal Swab     Status: None   Collection Time: 11/19/20  4:48 PM   Specimen: Nasopharyngeal Swab; Nasopharyngeal(NP) swabs in vial transport medium  Result Value Ref Range Status   SARS Coronavirus 2 by RT PCR NEGATIVE NEGATIVE Final    Comment: (NOTE) SARS-CoV-2 target nucleic acids are NOT DETECTED.  The SARS-CoV-2 RNA is generally detectable in upper respiratory specimens during the acute phase of infection. The lowest concentration of SARS-CoV-2 viral copies this assay can detect is 138 copies/mL. A negative result does not preclude SARS-Cov-2 infection and should not be used as the sole basis for treatment or other patient management decisions. A negative result may occur with  improper specimen collection/handling, submission of specimen other than nasopharyngeal swab, presence of viral mutation(s) within the areas targeted by this assay, and inadequate number of viral copies(<138 copies/mL). A negative result must be combined with clinical observations, patient history, and epidemiological information. The expected result is Negative.  Fact Sheet for Patients:  EntrepreneurPulse.com.au  Fact Sheet for Healthcare Providers:  IncredibleEmployment.be  This test is no t yet approved or cleared by the [place] FDA and  has been authorized for detection and/or diagnosis of SARS-CoV-2 by FDA under an Emergency Use Authorization (EUA). This EUA will remain  in effect (meaning this test can be used) for the duration of the COVID-19 declaration under Section 564(b)(1) of the Act, 21 U.S.C.section 360bbb-3(b)(1), unless the authorization is terminated  or revoked sooner.       Influenza A by PCR NEGATIVE NEGATIVE Final   Influenza B by PCR NEGATIVE NEGATIVE Final    Comment: (NOTE) The Xpert Xpress SARS-CoV-2/FLU/RSV plus assay is intended as an aid in  the diagnosis of influenza from Nasopharyngeal swab specimens and should not be used as a sole basis for treatment. Nasal washings and aspirates are unacceptable for Xpert Xpress SARS-CoV-2/FLU/RSV testing.  Fact Sheet for Patients: EntrepreneurPulse.com.au  Fact Sheet for Healthcare Providers: IncredibleEmployment.be  This test is not yet approved or cleared by the [place] FDA and has been authorized for detection and/or diagnosis of SARS-CoV-2 by FDA under an Emergency Use Authorization (EUA). This EUA will remain in effect (meaning this test can be used) for the duration of the COVID-19 declaration under Section 564(b)(1) of the Act, 21 U.S.C. section 360bbb-3(b)(1), unless the authorization is terminated or revoked.  Performed at [facility], [address] [name], [place] [address]   Blood culture (routine x 2)     Status: None   Collection Time: 11/19/20  6:46 PM   Specimen: BLOOD  Result Value Ref Range Status   Specimen Description BLOOD RIGHT ANTECUBITAL  Final  Special Requests   Final    BOTTLES DRAWN AEROBIC AND ANAEROBIC Blood Culture adequate volume   Culture   Final    NO GROWTH 5 DAYS Performed at [facility], [address] [place], [place] [address]    Report Status 11/24/2020 FINAL  Final  Urine Culture     Status: Abnormal   Collection Time: 11/19/20  9:33 PM   Specimen: Urine, Clean Catch  Result Value Ref Range Status   Specimen Description   Final    URINE, CLEAN CATCH Performed at [facility], [address] [place], [place] [address]    Special Requests   Final    NONE Performed at [facility], [address] [place], [place] [address]    Culture (A)  Final    <10,000 COLONIES/mL INSIGNIFICANT GROWTH Performed at [place] [address] [place], [place] [address]    Report Status 11/21/2020 FINAL  Final  Culture, fungus without smear      Status: None (Preliminary result)   Collection Time: 11/20/20 11:09 AM   Specimen: CSF; Cerebrospinal Fluid  Result Value Ref Range Status   Specimen Description   Final    CSF Performed at [facility], [address] [place], [place] [address]    Special Requests   Final    NONE Performed at [facility], [address], [place], [place] [address]    Culture   Final    NO FUNGUS ISOLATED AFTER 16 DAYS Performed at [facility], [place] [address], [place], [place] [address]    Report Status PENDING  Incomplete  CSF culture w Gram Stain     Status: None   Collection Time: 11/20/20 11:09 AM   Specimen: PATH Cytology CSF; Cerebrospinal Fluid  Result Value Ref Range Status   Specimen Description   Final    CSF Performed at [facility], [address], [place], [place] [address]    Special Requests   Final    NONE Performed at [facility], [address] [place], [place] [address]    Gram Stain   Final    NO ORGANISMS SEEN WBC SEEN RED BLOOD CELLS PRESENT Performed at [facility], [address], [place], [place] [address]    Culture   Final    NO GROWTH 3 DAYS Performed at [facility], [place] [address] [place], [place] [address]    Report Status 11/23/2020 FINAL  Final  Fungus Culture With Stain     Status: None (Preliminary result)   Collection Time: 11/20/20 11:09 AM   Specimen: PATH Cytology CSF; Cerebrospinal Fluid  Result Value Ref Range Status   Fungus Stain Final report  Final    Comment: (NOTE) Performed At: [facility] [place], [place] [id number] Rush Farmer MD [id number]    Fungus (Mycology) Culture PENDING  Incomplete   Fungal Source CSF  Final    Comment: Performed at [facility], [address] [place], [place] [address]  Fungus Culture Result     Status: None   Collection Time: 11/20/20 11:09 AM  Result Value Ref Range Status   Result 1 Comment   Final    Comment: (NOTE) KOH/Calcofluor preparation:  no fungus observed. Performed At: [facility] [place], [place] [id number] Rush Farmer MD [id number]   Resp Panel by RT-PCR (Flu A&B, Covid) Nasopharyngeal Swab     Status: None  Collection Time: 11/27/20  1:39 PM   Specimen: Nasopharyngeal Swab; Nasopharyngeal(NP) swabs in vial transport medium  Result Value Ref Range Status   SARS Coronavirus 2 by RT PCR NEGATIVE NEGATIVE Final    Comment: (NOTE) SARS-CoV-2 target nucleic acids are NOT DETECTED.  The SARS-CoV-2 RNA is generally detectable in upper respiratory specimens during the acute phase of infection. The lowest concentration of SARS-CoV-2 viral copies this assay can detect is 138 copies/mL. A negative result does not preclude SARS-Cov-2 infection and should not be used as the sole basis for treatment or other patient management decisions. A negative result may occur with  improper specimen collection/handling, submission of specimen other than nasopharyngeal swab, presence of viral mutation(s) within the areas targeted by this assay, and inadequate number of viral copies(<138 copies/mL). A negative result must be combined with clinical observations, patient history, and epidemiological information. The expected result is Negative.  Fact Sheet for Patients:  EntrepreneurPulse.com.au  Fact Sheet for Healthcare Providers:  IncredibleEmployment.be  This test is no t yet approved or cleared by the [place] FDA and  has been authorized for detection and/or diagnosis of SARS-CoV-2 by FDA under an Emergency Use Authorization (EUA). This EUA will remain  in effect (meaning this test can be used) for the duration of the COVID-19 declaration under Section 564(b)(1) of the Act, 21 U.S.C.section 360bbb-3(b)(1), unless the authorization is terminated  or revoked sooner.       Influenza A by PCR NEGATIVE  NEGATIVE Final   Influenza B by PCR NEGATIVE NEGATIVE Final    Comment: (NOTE) The Xpert Xpress SARS-CoV-2/FLU/RSV plus assay is intended as an aid in the diagnosis of influenza from Nasopharyngeal swab specimens and should not be used as a sole basis for treatment. Nasal washings and aspirates are unacceptable for Xpert Xpress SARS-CoV-2/FLU/RSV testing.  Fact Sheet for Patients: EntrepreneurPulse.com.au  Fact Sheet for Healthcare Providers: IncredibleEmployment.be  This test is not yet approved or cleared by the [place] FDA and has been authorized for detection and/or diagnosis of SARS-CoV-2 by FDA under an Emergency Use Authorization (EUA). This EUA will remain in effect (meaning this test can be used) for the duration of the COVID-19 declaration under Section 564(b)(1) of the Act, 21 U.S.C. section 360bbb-3(b)(1), unless the authorization is terminated or revoked.  Performed at [facility], [address] [place], [place] [address]   Urine Culture     Status: Abnormal   Collection Time: 11/27/20  2:15 PM   Specimen: Urine, Clean Catch  Result Value Ref Range Status   Specimen Description   Final    URINE, CLEAN CATCH Performed at [facility], [address], [place], [place] [address]    Special Requests   Final    NONE Performed at [facility], [place], [place] [address]    Culture 20,000 COLONIES/mL ENTEROBACTER AEROGENES (A)  Final   Report Status 11/30/2020 FINAL  Final   Organism ID, Bacteria ENTEROBACTER AEROGENES (A)  Final      Susceptibility   Enterobacter aerogenes - MIC*    CEFAZOLIN >=64 RESISTANT Resistant     CEFEPIME 1 SENSITIVE Sensitive     CEFTRIAXONE >=64 RESISTANT Resistant     CIPROFLOXACIN <=0.25 SENSITIVE Sensitive     GENTAMICIN <=1 SENSITIVE Sensitive     IMIPENEM <=0.25 SENSITIVE Sensitive     NITROFURANTOIN 64 INTERMEDIATE Intermediate      TRIMETH/SULFA <=20 SENSITIVE Sensitive     PIP/TAZO >=128 RESISTANT Resistant     *  20,000 COLONIES/mL ENTEROBACTER AEROGENES  CULTURE, BLOOD (ROUTINE X 2) w Reflex to ID Panel     Status: None   Collection Time: 11/27/20  8:23 PM   Specimen: BLOOD  Result Value Ref Range Status   Specimen Description BLOOD LAC  Final   Special Requests   Final    BOTTLES DRAWN AEROBIC AND ANAEROBIC Blood Culture results may not be optimal due to an inadequate volume of blood received in culture bottles   Culture   Final    NO GROWTH 5 DAYS Performed at [facility], [address] [name], [place] [address]    Report Status 12/02/2020 FINAL  Final  CULTURE, BLOOD (ROUTINE X 2) w Reflex to ID Panel     Status: None   Collection Time: 11/27/20  9:32 PM   Specimen: BLOOD LEFT HAND  Result Value Ref Range Status   Specimen Description BLOOD LEFT HAND  Final   Special Requests   Final    BOTTLES DRAWN AEROBIC AND ANAEROBIC Blood Culture adequate volume   Culture   Final    NO GROWTH 5 DAYS Performed at [facility], [address] [place], [place] [address]    Report Status 12/02/2020 FINAL  Final  CULTURE, BLOOD (ROUTINE X 2) w Reflex to ID Panel     Status: None (Preliminary result)   Collection Time: 12/06/20 10:35 AM   Specimen: BLOOD  Result Value Ref Range Status   Specimen Description BLOOD LT KNUCKLE  Final   Special Requests   Final    BOTTLES DRAWN AEROBIC AND ANAEROBIC Blood Culture adequate volume   Culture   Final    NO GROWTH < 24 HOURS Performed at [facility], [address] [place], [place] [address]    Report Status PENDING  Incomplete  CULTURE, BLOOD (ROUTINE X 2) w Reflex to ID Panel     Status: None (Preliminary result)   Collection Time: 12/06/20 10:35 AM   Specimen: BLOOD  Result Value Ref Range Status   Specimen Description BLOOD BLOOD LEFT WRIST  Final   Special Requests AEROBIC BOTTLE ONLY Blood Culture adequate volume  Final    Culture   Final    NO GROWTH < 24 HOURS Performed at [facility], [address] [place], [place] [address]    Report Status PENDING  Incomplete    Coagulation Studies: No results for input(s): LABPROT, INR in the last 72 hours.  Urinalysis: No results for input(s): COLORURINE, LABSPEC, PHURINE, GLUCOSEU, HGBUR, BILIRUBINUR, KETONESUR, PROTEINUR, UROBILINOGEN, NITRITE, LEUKOCYTESUR in the last 72 hours.  Invalid input(s): APPERANCEUR     Imaging: ECHOCARDIOGRAM COMPLETE  Result Date: 12/05/2020    ECHOCARDIOGRAM REPORT   Patient Name:   LASHICA HANNAY Date of Exam: 12/05/2020 Medical Rec #:  [id number]       Height:       72.0 in Accession #:    [id number]      Weight:       208.1 lb Date of Birth:  04-17-1966       BSA:          2.167 m Patient Age:    75 years        BP:           136/71 mmHg Patient Gender: F               HR:           80 bpm. Exam Location:  [facility] Procedure:  2D Echo, Color Doppler and Cardiac Doppler Indications:     R50.9 Fever  History:         Patient has prior history of Echocardiogram examinations, most                  recent 06/11/2020. Signs/Symptoms:Fever; Risk                  Factors:Hypertension and Diabetes. Asthma.  Sonographer:     Charmayne Sheer Referring Phys:  [id number] [name] Diagnosing Phys: Yolonda Kida MD  Sonographer Comments: Suboptimal apical window. IMPRESSIONS  1. Left ventricular ejection fraction, by estimation, is 60 to 65%. The left ventricle has normal function. The left ventricle has no regional wall motion abnormalities. Left ventricular diastolic parameters were normal.  2. Right ventricular systolic function is normal. The right ventricular size is normal. Mildly increased right ventricular wall thickness.  3. The mitral valve is normal in structure. No evidence of mitral valve regurgitation.  4. The aortic valve is normal in structure. Aortic valve regurgitation is not visualized. FINDINGS  Left Ventricle: Left  ventricular ejection fraction, by estimation, is 60 to 65%. The left ventricle has normal function. The left ventricle has no regional wall motion abnormalities. The left ventricular internal cavity size was normal in size. There is  borderline concentric left ventricular hypertrophy. Left ventricular diastolic parameters were normal. Right Ventricle: The right ventricular size is normal. Mildly increased right ventricular wall thickness. Right ventricular systolic function is normal. Left Atrium: Left atrial size was normal in size. Right Atrium: Right atrial size was normal in size. Pericardium: There is no evidence of pericardial effusion. Mitral Valve: The mitral valve is normal in structure. No evidence of mitral valve regurgitation. MV peak gradient, 2.6 mmHg. The mean mitral valve gradient is 1.0 mmHg. Tricuspid Valve: The tricuspid valve is normal in structure. Tricuspid valve regurgitation is not demonstrated. Aortic Valve: The aortic valve is normal in structure. Aortic valve regurgitation is not visualized. Aortic valve mean gradient measures 7.0 mmHg. Aortic valve peak gradient measures 12.5 mmHg. Aortic valve area, by VTI measures 2.59 cm. Pulmonic Valve: The pulmonic valve was normal in structure. Pulmonic valve regurgitation is not visualized. Aorta: The ascending aorta was not well visualized. IAS/Shunts: No atrial level shunt detected by color flow Doppler.  LEFT VENTRICLE PLAX 2D LVIDd:         4.91 cm  Diastology LVIDs:         3.33 cm  LV e' medial:    6.85 cm/s LV PW:         0.94 cm  LV E/e' medial:  10.7 LV IVS:        0.77 cm  LV e' lateral:   7.62 cm/s LVOT diam:     2.10 cm  LV E/e' lateral: 9.6 LV SV:         73 LV SV Index:   34 LVOT Area:     3.46 cm  LEFT ATRIUM             Index LA diam:        3.50 cm 1.62 cm/m LA Vol (A2C):   42.9 ml 19.80 ml/m LA Vol (A4C):   56.8 ml 26.21 ml/m LA Biplane Vol: 51.6 ml 23.81 ml/m  AORTIC VALVE                    PULMONIC VALVE AV Area (Vmax):     2.29 cm  PV Vmax:       1.40 m/s AV Area (Vmean):   2.48 cm     PV Vmean:      92.300 cm/s AV Area (VTI):     2.59 cm     PV VTI:        0.187 m AV Vmax:           177.00 cm/s  PV Peak grad:  7.8 mmHg AV Vmean:          118.000 cm/s PV Mean grad:  4.0 mmHg AV VTI:            0.284 m AV Peak Grad:      12.5 mmHg AV Mean Grad:      7.0 mmHg LVOT Vmax:         117.00 cm/s LVOT Vmean:        84.600 cm/s LVOT VTI:          0.212 m LVOT/AV VTI ratio: 0.75  AORTA Ao Root diam: 2.80 cm MITRAL VALVE MV Area (PHT): 2.74 cm    SHUNTS MV Area VTI:   3.16 cm    Systemic VTI:  0.21 m MV Peak grad:  2.6 mmHg    Systemic Diam: 2.10 cm MV Mean grad:  1.0 mmHg MV Vmax:       0.80 m/s MV Vmean:      56.2 cm/s MV Decel Time: 277 msec MV E velocity: 73.30 cm/s MV A velocity: 66.80 cm/s MV E/A ratio:  1.10 Dwayne D Callwood MD Electronically signed by Yolonda Kida MD Signature Date/Time: 12/05/2020/6:14:29 PM    Final      Medications:      Chlorhexidine Gluconate Cloth  6 each Topical Daily   diltiazem  120 mg Oral Daily   feeding supplement  1 Container Oral TID BM   hydrALAZINE  50 mg Oral Q8H   insulin aspart  0-15 Units Subcutaneous TID WC   insulin aspart  5 Units Subcutaneous TID WC   insulin glargine-yfgn  12 Units Subcutaneous BID   levothyroxine  100 mcg Oral QAC breakfast   magnesium oxide  800 mg Oral BID   mirtazapine  15 mg Oral QHS   multivitamin with minerals  1 tablet Oral Daily   pantoprazole  40 mg Oral Daily   psyllium  1 packet Oral Daily   rosuvastatin  10 mg Oral QHS   sodium bicarbonate  650 mg Oral TID   sodium chloride flush  10-40 mL Intracatheter Q12H   thiamine  100 mg Oral Daily   acetaminophen **OR** acetaminophen, albuterol, haloperidol lactate, loperamide, ondansetron **OR** ondansetron (ZOFRAN) IV, polyethylene glycol, sodium chloride flush  Assessment/ Plan:  [name] is a 55 y.o.  female with medical conditions including pneumococcal meningitis  bacteremia, hypothyroidism, Type 2 diabetes, and lymphoma in remission. She presents to the ED with confusion and is admitted for Altered mental status [R41.82] Encephalopathy acute [G93.40] Altered mental status, unspecified altered mental status type [R41.82] Sepsis with encephalopathy without septic shock, due to unspecified organism ([place]) [A41.9, R65.20, G93.40]      1)Renal    Acute kidney injury Patient has acute kidney injury secondary to contrast induced ATN Thereafter patient UA did show proteinuria and hematuria. Autoimmune work-up was done. 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 Patient C3-C4 and total complements were low Patient Ro antibody came back positive Patient is on pulse  steroids. 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  CBC Latest Ref Rng & Units 12/07/2020 12/06/2020 12/05/2020  WBC 4.0 - 10.5 K/uL 5.1 2.6(L) 3.0(L)  Hemoglobin 12.0 - 15.0 g/dL 7.4(L) 7.9(L) 7.6(L)  Hematocrit 36.0 - 46.0 % 21.8(L) 23.9(L) 22.8(L)  Platelets 150 - 400 K/uL 34(L) 35(L) 43(L)    Patient has pancytopenia    HGb is not at goal (9--11)   4)  Electrolytes   BMP Latest Ref Rng & Units 12/07/2020 12/06/2020 12/05/2020  Glucose 70 - 99 mg/dL 395(H) 361(H) 64(L)  BUN 6 - 20 mg/dL 42(H) 31(H) 23(H)  Creatinine 0.44 - 1.00 mg/dL 3.38(H) 3.22(H) 2.88(H)  Sodium 135 - 145 mmol/L 132(L) 135 138  Potassium 3.5 - 5.1 mmol/L 4.5 4.3 3.6  Chloride 98 - 111 mmol/L 109 108 113(H)  CO2 22 - 32 mmol/L 18(L) 16(L) 20(L)  Calcium 8.9 - 10.3 mg/dL 7.6(L) 7.7(L) 7.7(L)     Sodium Hyponatremic Secondary to  hyperglycemia  Potassium Normokalemic    7)Acid base  Co2 is not at goal Patient has acidosis patient on p.o. bicarb  8) CLL Patient is s/p bone marrow biopsy Hematology and primary team are following     Plan    We will continue patient on pulse steroids I again had  discussion with the patient regarding need/risk/benefit of renal biopsy     LOS: 17 Gershon Shorten s Gaytha Raybourn 8/14/202210:15 AM

## 2020-12-07 NOTE — TOC Progression Note (Signed)
Transition of Care [facility]) - Progression Note    Patient Details  Name: Maddeline Roorda MRN: [id number] Date of Birth: 02-02-1966  Transition of Care [facility]) [name] Contact  Izola Price, RN Phone Number: 12/07/2020, 11:55 AM  Clinical Narrative: Per progression Liver Bx Monday. EDD 2-3 days. [name] RN CM       Expected Discharge Plan: [place] Barriers to Discharge: Continued Medical Work up  Expected Discharge Plan and Services Expected Discharge Plan: [place]   Discharge Planning Services: CM Consult Post Acute Care Choice: [place], [name] Living arrangements for the past 2 months: Single Family Home                 DME Arranged: N/A DME Agency: NA       HH Arranged: PT, OT [place] Agency: [place] ([place]) Date HH Agency Contacted: 11/24/20 Time [place]: 1419 Representative spoke with at [place]: [place] ([place]) Interventions    Readmission Risk Interventions Readmission Risk Prevention Plan 11/24/2020  Transportation Screening Complete  PCP or Specialist Appt within 3-5 Days Complete  HRI or [place] Complete  Social Work Consult for [place] Planning/Counseling Complete  Palliative Care Screening Not Applicable  Medication Review [employer]) Complete  Some recent data might be hidden

## 2020-12-08 DIAGNOSIS — N179 Acute kidney failure, unspecified: Secondary | ICD-10-CM | POA: Diagnosis not present

## 2020-12-08 DIAGNOSIS — R509 Fever, unspecified: Secondary | ICD-10-CM | POA: Diagnosis not present

## 2020-12-08 LAB — CBC WITH DIFFERENTIAL/PLATELET
Abs Immature Granulocytes: 0.06 10*3/uL (ref 0.00–0.07)
Basophils Absolute: 0 10*3/uL (ref 0.0–0.1)
Basophils Relative: 0 %
Eosinophils Absolute: 0 10*3/uL (ref 0.0–0.5)
Eosinophils Relative: 0 %
HCT: 22.4 % — ABNORMAL LOW (ref 36.0–46.0)
Hemoglobin: 7.4 g/dL — ABNORMAL LOW (ref 12.0–15.0)
Immature Granulocytes: 1 %
Lymphocytes Relative: 11 %
Lymphs Abs: 0.8 10*3/uL (ref 0.7–4.0)
MCH: 26.8 pg (ref 26.0–34.0)
MCHC: 33 g/dL (ref 30.0–36.0)
MCV: 81.2 fL (ref 80.0–100.0)
Monocytes Absolute: 0.3 10*3/uL (ref 0.1–1.0)
Monocytes Relative: 3 %
Neutro Abs: 6.5 10*3/uL (ref 1.7–7.7)
Neutrophils Relative %: 85 %
Platelets: 30 10*3/uL — ABNORMAL LOW (ref 150–400)
RBC: 2.76 MIL/uL — ABNORMAL LOW (ref 3.87–5.11)
RDW: 16.9 % — ABNORMAL HIGH (ref 11.5–15.5)
WBC: 7.6 10*3/uL (ref 4.0–10.5)
nRBC: 0 % (ref 0.0–0.2)

## 2020-12-08 LAB — BASIC METABOLIC PANEL
Anion gap: 5 (ref 5–15)
BUN: 56 mg/dL — ABNORMAL HIGH (ref 6–20)
CO2: 21 mmol/L — ABNORMAL LOW (ref 22–32)
Calcium: 7.9 mg/dL — ABNORMAL LOW (ref 8.9–10.3)
Chloride: 110 mmol/L (ref 98–111)
Creatinine, Ser: 3.72 mg/dL — ABNORMAL HIGH (ref 0.44–1.00)
GFR, Estimated: 14 mL/min — ABNORMAL LOW (ref >60)
Glucose, Bld: 394 mg/dL — ABNORMAL HIGH (ref 70–99)
Potassium: 4.6 mmol/L (ref 3.5–5.1)
Sodium: 136 mmol/L (ref 135–145)

## 2020-12-08 LAB — MISC LABCORP TEST (SEND OUT): Labcorp test code: 138412

## 2020-12-08 LAB — GLUCOSE, CAPILLARY
Glucose-Capillary: 395 mg/dL — ABNORMAL HIGH (ref 70–99)
Glucose-Capillary: 358 mg/dL — ABNORMAL HIGH (ref 70–99)
Glucose-Capillary: 425 mg/dL — ABNORMAL HIGH (ref 70–99)
Glucose-Capillary: 440 mg/dL — ABNORMAL HIGH (ref 70–99)
Glucose-Capillary: 380 mg/dL — ABNORMAL HIGH (ref 70–99)
Glucose-Capillary: 329 mg/dL — ABNORMAL HIGH (ref 70–99)

## 2020-12-08 LAB — MAGNESIUM: Magnesium: 2 mg/dL (ref 1.7–2.4)

## 2020-12-08 MED ORDER — INSULIN ASPART 100 UNIT/ML IJ SOLN
10.0000 [IU] | Freq: Once | INTRAMUSCULAR | Status: AC
  Administered 2020-12-08: 10 [IU] via SUBCUTANEOUS
  Filled 2020-12-08: qty 1

## 2020-12-08 MED ORDER — INSULIN GLARGINE-YFGN 100 UNIT/ML ~~LOC~~ SOLN
15.0000 [IU] | Freq: Two times a day (BID) | SUBCUTANEOUS | Status: DC
  Administered 2020-12-08 – 2020-12-09 (×3): 15 [IU] via SUBCUTANEOUS
  Filled 2020-12-08 (×4): qty 0.15

## 2020-12-08 MED ORDER — INSULIN ASPART 100 UNIT/ML IJ SOLN
7.0000 [IU] | Freq: Three times a day (TID) | INTRAMUSCULAR | Status: DC
  Administered 2020-12-08 – 2020-12-09 (×3): 7 [IU] via SUBCUTANEOUS
  Filled 2020-12-08 (×3): qty 1

## 2020-12-08 NOTE — Progress Notes (Signed)
PROGRESS NOTE    Tonya Myers  ZOX:[id number] DOB: 11-06-1965 DOA: 11/19/2020 PCP: Tonya Pink, MD   Brief Narrative:  55 y.o. female with medical history significant for recent pneumococcal meningitis and bacteremia, depression, hypothyroid, IDDM2, presents to the ED for chief concern of altered mentation.  Extended hospitalization.  Etiology of encephalopathy is unclear.  Encephalopathy has resolved and patient has baseline level of mentation however she began to spike intermittent fevers associated with progressive worsening of kidney function.  Nephrology and infectious disease both following.  Patient is on IV antibiotics and intravenous fluids.  Work-up has been unrevealing for infectious cause.  Progressively worsening renal failure.  Serologic work-up ANA positive, ANCA positive, hypocomplementemia.  Suspect autoimmune process, likely vasculitis.  Started on high-dose intravenous pulse steroids on 8/12.  No fever since initiation of high-dose steroids.  Bone marrow biopsy results are pending.  Patient likely will require kidney biopsy.  Kidney function continues to worsen.   Assessment & Plan:   Principal Problem:   Subacute delirium Active Problems:   DDD (degenerative disc disease), cervical   Aphasia   Anemia of chronic disease   Acute focal neurological deficit   Encephalitis   Altered mental status   Encephalopathy acute  Positive ANA Positive autoimmune serologies Serologic work-up indicative of underlying autoimmune process, likely vasculitis.  Still unable to completely rule out infectious etiology. Completed 3 days high-dose steroid Creatinine worse Plan: Daily kidney function, creat continues to worsen Follow blood cultures, drawn 8/14, NGDT Monitor vitals and fever curve  Fever, recurrent Hx of epidural and paraspinal abscess daily fevers  repeat covid neg.  No cough or dyspnea to suggest PNA.  Procal increased. MRI brain, spine 8/5, no obvious  infection Case discussed at length with ID No clear infectious focus Ciprofloxacin and doxycycline. Continues to have daily fevers For extremity Doppler negative for VTE Inflammatory markers elevated but downtrending Last documented fever 101.8 on 8/12 Plan: Monitor vitals and fever curve off of antibiotics Follow-up bone marrow biopsy, pending  AKI --baseline ~0.7.  Urine appeared very dark.   --nephrology consulted --renal US showed medical renal disease Creatinine continues to worsen Plan: Nephrology following for AKI.  Serologic work-up indicates autoimmune process, likely vasculitis.  Creatinine worsening.  Will likely need kidney biopsy.  Patient aware.  Will await nephrology evaluation   Acute pancreatitis Unclear etiology No reported history of alcohol intake, no radiographic evidence of gallstones Possible drug-induced however unclear exactly which drug could have driven this Initial abdominal examination negative for tenderness Lipase trending down with IVF, but much slowly than usual. Triglycerides negative Plan: Patient tolerating p.o. intake.  Suspect the pancreatitis is resolved If continued concern for pancreatic abscess versus pseudocyst a contrast-enhanced CT scan will need to be done which currently is not possible due to progressive renal failure   Acute toxic metabolic encephalopathy, resolved Unclear etiology --meningitis ruled out by LP.  CT head neg for new stroke.  Hematology ruled out CLL as the culprit.  No seizure on EEG. --mental status dramatically improved with treatment of pancreatitis and IV thiamine repletion. 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 intake is driven by appetite rather than pain --s/p Diflucan for 3 doses   Insulin-dependent diabetes mellitus Hypoglycemic episodes Patient is on long-acting formulation 50 units daily at home in addition to metformin --A1c 6.5 High-dose steroids started 8/12 Plan: -- Increase glargine 8 units daily --SSI   Hyperlipidemia --cont statin   Hypothyroid --cont Synthroid   HTN --cont cardizem and hydralazine   DVT prophylaxis: TED hose Code Status: Full code Family Communication: [name] [phone] on 8/15 Disposition Plan:Status is: Inpatient  Remains inpatient appropriate because:Inpatient level of care appropriate due to severity of illness  Dispo: The patient is from: Home              Anticipated d/c is to: Home              Patient currently is not medically stable to d/c.   Difficult to place patient No  Encephalopathy resolved.  Serologic work-up indicative of autoimmune process.  Creatinine worsening.  Will need kidney biopsy at some point.     Level of care: Med-Surg  Consultants:  Nephrology ID  Procedures: LP Bone marrow biopsy  Antimicrobials:  Ciprofloxacin   Subjective: Seen and examined.  Sitting comfortably in bed.  Stable this AM.  Husband at bedside.  Objective: Vitals:   12/07/20 1602 12/07/20 2131 12/08/20 0629 12/08/20 0848  BP: 118/73 (!) 142/71 (!) 113/59 121/62  Pulse: (!) 57 61 (!) 56 (!) 57  Resp: 18 18 16 18   Temp: 97.8 F (36.6 C) 98.2 F (36.8 C) (!) 97.5 F (36.4 C) 97.8 F (36.6 C)  TempSrc:  Oral Oral    SpO2: 100% 100% 95% 98%  Weight:      Height:        Intake/Output Summary (Last 24 hours) at 12/08/2020 1034 Last data filed at 12/08/2020 1021 Gross per 24 hour  Intake 298 ml  Output --  Net 298 ml   Filed Weights   11/20/20 0507 12/01/20 0920 12/01/20 1730  Weight: 83 kg 94.9 kg 94.4 kg    Examination:  General exam: Sitting comfortably in bed.  No apparent distress Respiratory system: Clear to auscultation. Respiratory effort normal. Cardiovascular system: S1-S2, regular rate and rhythm, no murmurs, no pedal edema  gastrointestinal system: Soft, nontender, nondistended, normal bowel sounds Central nervous system: Alert and oriented. No focal neurological deficits. Extremities: Symmetric 5 x 5 power. Skin: No rashes, lesions or ulcers Psychiatry: Judgement and insight appear normal. Mood & affect flattened.     Data Reviewed: I have personally reviewed following labs and imaging studies  CBC: Recent Labs  Lab 12/04/20 0609 12/05/20 0521 12/06/20 1035 12/07/20 0511 12/08/20 0719  WBC 3.6* 3.0* 2.6* 5.1 7.6  NEUTROABS 2.7 1.9 2.1 4.1 6.5  HGB 7.8* 7.6* 7.9* 7.4* 7.4*  HCT 23.4* 22.8* 23.9* 21.8* 22.4*  MCV 81.8 82.0 81.8 79.3* 81.2  PLT 47* 43* 35* 34* 30*   Basic Metabolic Panel: Recent Labs  Lab 12/02/20 0601 12/03/20 0521 12/04/20 0609 12/05/20 0521 12/06/20 1035 12/07/20 0511 12/08/20 0719  NA 137 138 136 138 135 132* 136  K 3.9 3.8 3.7 3.6 4.3 4.5 4.6  CL 111 111 109 113* 108 109 110  CO2 21*  22 20* 20* 16* 18* 21*  GLUCOSE 105* 88 79 64* 361* 395* 394*  BUN 16 19 19  23* 31* 42* 56*  CREATININE 2.06* 2.41* 2.66* 2.88* 3.22* 3.38* 3.72*  CALCIUM 7.8* 7.6* 7.7* 7.7* 7.7* 7.6* 7.9*  MG 2.0 1.8 1.7 1.7  --   --  2.0   GFR: Estimated Creatinine Clearance: 21.5 mL/min (A) (by C-G formula based on SCr of 3.72 mg/dL (H)). Liver Function Tests: Recent Labs  Lab 12/03/20 1140  AST 62*  ALT 12  ALKPHOS 75  BILITOT 0.6  PROT 5.0*  ALBUMIN 1.9*    Recent Labs  Lab 12/03/20 0521 12/04/20 0609 12/05/20 0521 12/06/20 1035 12/07/20 0511  LIPASE 190* 149* 130* 90* 135*   No results for input(s): AMMONIA in the last 168 hours. Coagulation Profile: No results for input(s): INR, PROTIME in the last 168 hours. Cardiac Enzymes: Recent Labs  Lab 12/03/20 1140  CKTOTAL 19*   BNP (last 3 results) No results for input(s): PROBNP in the last 8760 hours. HbA1C: No results for input(s): HGBA1C in the last 72 hours. CBG: Recent Labs  Lab 12/07/20 0842 12/07/20 1154 12/07/20 1604 12/07/20 2133 12/08/20 0727  GLUCAP 375* 391* 382* 361* 358*   Lipid Profile: No results for input(s): CHOL, HDL, LDLCALC, TRIG, CHOLHDL, LDLDIRECT in the last 72 hours. Thyroid Function Tests: No results for input(s): TSH, T4TOTAL, FREET4, T3FREE, THYROIDAB in the last 72 hours. Anemia Panel: No results for input(s): VITAMINB12, FOLATE, FERRITIN, TIBC, IRON, RETICCTPCT in the last 72 hours.  Sepsis Labs: Recent Labs  Lab 12/02/20 0601 12/03/20 0521 12/04/20 0609  PROCALCITON 1.74 1.71 2.13    Recent Results (from the past 240 hour(s))  CULTURE, BLOOD (ROUTINE X 2) w Reflex to ID Panel     Status: None (Preliminary result)   Collection Time: 12/06/20 10:35 AM   Specimen: BLOOD  Result Value Ref Range Status   Specimen Description BLOOD LT KNUCKLE  Final   Special Requests   Final    BOTTLES DRAWN AEROBIC AND ANAEROBIC Blood Culture adequate volume   Culture   Final    NO GROWTH < 24 HOURS Performed at [facility], [address] [place], [place] [address]    Report Status PENDING  Incomplete  CULTURE, BLOOD (ROUTINE X 2) w Reflex to ID Panel     Status: None (Preliminary result)   Collection Time: 12/06/20 10:35 AM   Specimen: BLOOD  Result Value Ref Range Status   Specimen Description BLOOD BLOOD LEFT WRIST  Final   Special Requests AEROBIC BOTTLE ONLY Blood Culture adequate volume  Final   Culture   Final    NO GROWTH  < 24 HOURS Performed at [facility], [address], [place], [place] [address]    Report Status PENDING  Incomplete         Radiology Studies: No results found.      Scheduled Meds:  Chlorhexidine Gluconate Cloth  6 each Topical Daily   diltiazem  120 mg Oral Daily   feeding supplement  1 Container Oral TID BM   hydrALAZINE  50 mg Oral Q8H   insulin aspart  0-15 Units Subcutaneous TID WC   insulin aspart  7 Units Subcutaneous TID WC   insulin glargine-yfgn  15 Units Subcutaneous BID   levothyroxine  100 mcg Oral QAC breakfast   magnesium oxide  800 mg Oral BID   mirtazapine  15 mg Oral QHS   multivitamin with minerals  1  tablet Oral Daily   pantoprazole  40 mg Oral Daily   psyllium  1 packet Oral Daily   rosuvastatin  10 mg Oral QHS   sodium bicarbonate  650 mg Oral TID   sodium chloride flush  10-40 mL Intracatheter Q12H   thiamine  100 mg Oral Daily   Continuous Infusions:      LOS: 18 days    Time spent: 25 minutes    Sidney Ace, MD [facility] Pager [phone]  If 7PM-7AM, please contact night-coverage 12/08/2020, 10:34 AM

## 2020-12-08 NOTE — Progress Notes (Signed)
[facility]  ROUNDING NOTE   Subjective:   Tonya Myers is a 55 y.o. female with medical conditions including pneumococcal meningitis bacteremia, hypothyroidism, Type 2 diabetes, and lymphoma in remission. She presents to the ED with confusion and is admitted for Altered mental status [R41.82] Encephalopathy acute [G93.40] Altered mental status, unspecified altered mental status type [R41.82] Sepsis with encephalopathy without septic shock, due to unspecified organism ([place]) [A41.9, R65.20, G93.40]  Patient seen sitting up in chair Completing session with OT Maintains a good appetite  Voices frustration that its been 3 weeks with what she feels is little progress.    Objective:  Vital signs in last 24 hours:  Temp:  [97.5 F (36.4 C)-98.2 F (36.8 C)] 97.6 F (36.4 C) (08/15 1153) Pulse Rate:  [56-80] 80 (08/15 1153) Resp:  [16-20] 20 (08/15 1153) BP: (113-142)/(59-76) 138/76 (08/15 1153) SpO2:  [95 %-100 %] 100 % (08/15 1153)  Weight change:  Filed Weights   11/20/20 0507 12/01/20 0920 12/01/20 1730  Weight: 83 kg 94.9 kg 94.4 kg    Intake/Output: I/O last 3 completed shifts: In: 95 [P.O.:240; I.V.:10; IV Piggyback:58] Out: 400 [Urine:400]   Intake/Output this shift:  No intake/output data recorded.  Physical Exam: General: NAD, sitting in chair  Head: Normocephalic, atraumatic. Moist oral mucosal membranes  Eyes: Anicteric  Lungs:  Clear to auscultation, normal effort  Heart: Regular rate and rhythm  Abdomen:  Soft, nontender  Extremities: trace peripheral edema.  Neurologic: Alert, moving all four extremities  Skin: No lesions       Basic Metabolic Panel: Recent Labs  Lab 12/02/20 0601 12/03/20 0521 12/04/20 0609 12/05/20 0521 12/06/20 1035 12/07/20 0511 12/08/20 0719  NA 137 138 136 138 135 132* 136  K 3.9 3.8 3.7 3.6 4.3 4.5 4.6  CL 111 111 109 113* 108 109 110  CO2 21* 22 20* 20* 16* 18* 21*  GLUCOSE 105* 88 79 64* 361* 395*  394*  BUN _0 23* 31* 42* 56*  CREATININE 2.06* 2.41* 2.66* 2.88* 3.22* 3.38* 3.72*  CALCIUM 7.8* 7.6* 7.7* 7.7* 7.7* 7.6* 7.9*  MG 2.0 1.8 1.7 1.7  --   --  2.0     Liver Function Tests: Recent Labs  Lab 12/03/20 1140  AST 62*  ALT 12  ALKPHOS 75  BILITOT 0.6  PROT 5.0*  ALBUMIN 1.9*    Recent Labs  Lab 12/03/20 0521 12/04/20 0609 12/05/20 0521 12/06/20 1035 12/07/20 0511  LIPASE 190* 149* 130* 90* 135*    No results for input(s): AMMONIA in the last 168 hours.  CBC: Recent Labs  Lab 12/04/20 0609 12/05/20 0521 12/06/20 1035 12/07/20 0511 12/08/20 0719  WBC 3.6* 3.0* 2.6* 5.1 7.6  NEUTROABS 2.7 1.9 2.1 4.1 6.5  HGB 7.8* 7.6* 7.9* 7.4* 7.4*  HCT 23.4* 22.8* 23.9* 21.8* 22.4*  MCV 81.8 82.0 81.8 79.3* 81.2  PLT 47* 43* 35* 34* 30*     Cardiac Enzymes: Recent Labs  Lab 12/03/20 1140  CKTOTAL 19*     BNP: Invalid input(s): POCBNP  CBG: Recent Labs  Lab 12/07/20 1154 12/07/20 1604 12/07/20 2133 12/08/20 0727 12/08/20 1201  GLUCAP 391* 382* 361* 21* 425*     Microbiology: Results for orders placed or performed during the hospital encounter of 11/19/20  Blood culture (routine x 2)     Status: None   Collection Time: 11/19/20  4:47 PM   Specimen: BLOOD  Result Value Ref Range Status   Specimen Description BLOOD RIGHT  ANTECUBITAL  Final   Special Requests   Final    BOTTLES DRAWN AEROBIC AND ANAEROBIC Blood Culture adequate volume   Culture   Final    NO GROWTH 5 DAYS Performed at [facility], [address] [place], [place] [address]    Report Status 11/24/2020 FINAL  Final  Resp Panel by RT-PCR (Flu A&B, Covid) Nasopharyngeal Swab     Status: None   Collection Time: 11/19/20  4:48 PM   Specimen: Nasopharyngeal Swab; Nasopharyngeal(NP) swabs in vial transport medium  Result Value Ref Range Status   SARS Coronavirus 2 by RT PCR NEGATIVE NEGATIVE Final    Comment: (NOTE) SARS-CoV-2 target nucleic acids are NOT  DETECTED.  The SARS-CoV-2 RNA is generally detectable in upper respiratory specimens during the acute phase of infection. The lowest concentration of SARS-CoV-2 viral copies this assay can detect is 138 copies/mL. A negative result does not preclude SARS-Cov-2 infection and should not be used as the sole basis for treatment or other patient management decisions. A negative result may occur with  improper specimen collection/handling, submission of specimen other than nasopharyngeal swab, presence of viral mutation(s) within the areas targeted by this assay, and inadequate number of viral copies(<138 copies/mL). A negative result must be combined with clinical observations, patient history, and epidemiological information. The expected result is Negative.  Fact Sheet for Patients:  EntrepreneurPulse.com.au  Fact Sheet for Healthcare Providers:  IncredibleEmployment.be  This test is no t yet approved or cleared by the [place] FDA and  has been authorized for detection and/or diagnosis of SARS-CoV-2 by FDA under an Emergency Use Authorization (EUA). This EUA will remain  in effect (meaning this test can be used) for the duration of the COVID-19 declaration under Section 564(b)(1) of the Act, 21 U.S.C.section 360bbb-3(b)(1), unless the authorization is terminated  or revoked sooner.       Influenza A by PCR NEGATIVE NEGATIVE Final   Influenza B by PCR NEGATIVE NEGATIVE Final    Comment: (NOTE) The Xpert Xpress SARS-CoV-2/FLU/RSV plus assay is intended as an aid in the diagnosis of influenza from Nasopharyngeal swab specimens and should not be used as a sole basis for treatment. Nasal washings and aspirates are unacceptable for Xpert Xpress SARS-CoV-2/FLU/RSV testing.  Fact Sheet for Patients: EntrepreneurPulse.com.au  Fact Sheet for Healthcare Providers: IncredibleEmployment.be  This test is not yet  approved or cleared by the [place] FDA and has been authorized for detection and/or diagnosis of SARS-CoV-2 by FDA under an Emergency Use Authorization (EUA). This EUA will remain in effect (meaning this test can be used) for the duration of the COVID-19 declaration under Section 564(b)(1) of the Act, 21 U.S.C. section 360bbb-3(b)(1), unless the authorization is terminated or revoked.  Performed at [facility], [address] [place], [place] [address]   Blood culture (routine x 2)     Status: None   Collection Time: 11/19/20  6:46 PM   Specimen: BLOOD  Result Value Ref Range Status   Specimen Description BLOOD RIGHT ANTECUBITAL  Final   Special Requests   Final    BOTTLES DRAWN AEROBIC AND ANAEROBIC Blood Culture adequate volume   Culture   Final    NO GROWTH 5 DAYS Performed at [facility], [address] [place], [place] [address]    Report Status 11/24/2020 FINAL  Final  Urine Culture     Status: Abnormal   Collection Time: 11/19/20  9:33 PM   Specimen: Urine, Clean Catch  Result Value  Ref Range Status   Specimen Description   Final    URINE, CLEAN CATCH Performed at [facility], [address] [place], [place] [address]    Special Requests   Final    NONE Performed at [facility], [address] [place], [place] [address]    Culture (A)  Final    <10,000 COLONIES/mL INSIGNIFICANT GROWTH Performed at [facility] [address], [place], [place] [address]    Report Status 11/21/2020 FINAL  Final  Culture, fungus without smear     Status: None (Preliminary result)   Collection Time: 11/20/20 11:09 AM   Specimen: CSF; Cerebrospinal Fluid  Result Value Ref Range Status   Specimen Description   Final    CSF Performed at [facility], [address], [place], [place] [address]    Special Requests   Final    NONE Performed at [facility], [address] [place], [place] [address]     Culture   Final    NO FUNGUS ISOLATED AFTER 17 DAYS Performed at [name], [place] [address] [place], [place] [address]    Report Status PENDING  Incomplete  CSF culture w Gram Stain     Status: None   Collection Time: 11/20/20 11:09 AM   Specimen: PATH Cytology CSF; Cerebrospinal Fluid  Result Value Ref Range Status   Specimen Description   Final    CSF Performed at [facility], [address] [place], [place] [address]    Special Requests   Final    NONE Performed at [facility], [address] [place], [place] [address]    Gram Stain   Final    NO ORGANISMS SEEN WBC SEEN RED BLOOD CELLS PRESENT Performed at [facility], [address] [place], [place] [address]    Culture   Final    NO GROWTH 3 DAYS Performed at [facility], [place] [address], [name], [place] [address]    Report Status 11/23/2020 FINAL  Final  Fungus Culture With Stain     Status: None (Preliminary result)   Collection Time: 11/20/20 11:09 AM   Specimen: PATH Cytology CSF; Cerebrospinal Fluid  Result Value Ref Range Status   Fungus Stain Final report  Final    Comment: (NOTE) Performed At: [facility] [place], [place] [id number] Rush Farmer MD [id number]    Fungus (Mycology) Culture PENDING  Incomplete   Fungal Source CSF  Final    Comment: Performed at [facility], [address] [place],  [address]  Fungus Culture Result     Status: None   Collection Time: 11/20/20 11:09 AM  Result Value Ref Range Status   Result 1 Comment  Final    Comment: (NOTE) KOH/Calcofluor preparation:  no fungus observed. Performed At: [facility] [place], [place] [id number] Rush Farmer MD [id number]   Resp Panel by RT-PCR (Flu A&B, Covid) Nasopharyngeal Swab     Status: None   Collection Time: 11/27/20  1:39 PM   Specimen: Nasopharyngeal Swab; Nasopharyngeal(NP) swabs in vial transport medium   Result Value Ref Range Status   SARS Coronavirus 2 by RT PCR NEGATIVE NEGATIVE Final    Comment: (NOTE) SARS-CoV-2 target nucleic acids are NOT DETECTED.  The SARS-CoV-2 RNA is generally detectable in upper respiratory specimens during the acute phase of infection. The lowest concentration of SARS-CoV-2 viral copies this assay can detect is 138 copies/mL.  A negative result does not preclude SARS-Cov-2 infection and should not be used as the sole basis for treatment or other patient management decisions. A negative result may occur with  improper specimen collection/handling, submission of specimen other than nasopharyngeal swab, presence of viral mutation(s) within the areas targeted by this assay, and inadequate number of viral copies(<138 copies/mL). A negative result must be combined with clinical observations, patient history, and epidemiological information. The expected result is Negative.  Fact Sheet for Patients:  EntrepreneurPulse.com.au  Fact Sheet for Healthcare Providers:  IncredibleEmployment.be  This test is no t yet approved or cleared by the [place] FDA and  has been authorized for detection and/or diagnosis of SARS-CoV-2 by FDA under an Emergency Use Authorization (EUA). This EUA will remain  in effect (meaning this test can be used) for the duration of the COVID-19 declaration under Section 564(b)(1) of the Act, 21 U.S.C.section 360bbb-3(b)(1), unless the authorization is terminated  or revoked sooner.       Influenza A by PCR NEGATIVE NEGATIVE Final   Influenza B by PCR NEGATIVE NEGATIVE Final    Comment: (NOTE) The Xpert Xpress SARS-CoV-2/FLU/RSV plus assay is intended as an aid in the diagnosis of influenza from Nasopharyngeal swab specimens and should not be used as a sole basis for treatment. Nasal washings and aspirates are unacceptable for Xpert Xpress SARS-CoV-2/FLU/RSV testing.  Fact Sheet for  Patients: EntrepreneurPulse.com.au  Fact Sheet for Healthcare Providers: IncredibleEmployment.be  This test is not yet approved or cleared by the [place] FDA and has been authorized for detection and/or diagnosis of SARS-CoV-2 by FDA under an Emergency Use Authorization (EUA). This EUA will remain in effect (meaning this test can be used) for the duration of the COVID-19 declaration under Section 564(b)(1) of the Act, 21 U.S.C. section 360bbb-3(b)(1), unless the authorization is terminated or revoked.  Performed at [facility], [address] [place], [place] [address]   Urine Culture     Status: Abnormal   Collection Time: 11/27/20  2:15 PM   Specimen: Urine, Clean Catch  Result Value Ref Range Status   Specimen Description   Final    URINE, CLEAN CATCH Performed at [facility], [address] [place], [place] [address]    Special Requests   Final    NONE Performed at [facility], [place], [place] [address]    Culture 20,000 COLONIES/mL ENTEROBACTER AEROGENES (A)  Final   Report Status 11/30/2020 FINAL  Final   Organism ID, Bacteria ENTEROBACTER AEROGENES (A)  Final      Susceptibility   Enterobacter aerogenes - MIC*    CEFAZOLIN >=64 RESISTANT Resistant     CEFEPIME 1 SENSITIVE Sensitive     CEFTRIAXONE >=64 RESISTANT Resistant     CIPROFLOXACIN <=0.25 SENSITIVE Sensitive     GENTAMICIN <=1 SENSITIVE Sensitive     IMIPENEM <=0.25 SENSITIVE Sensitive     NITROFURANTOIN 64 INTERMEDIATE Intermediate     TRIMETH/SULFA <=20 SENSITIVE Sensitive     PIP/TAZO >=128 RESISTANT Resistant     * 20,000 COLONIES/mL ENTEROBACTER AEROGENES  CULTURE, BLOOD (ROUTINE X 2) w Reflex to ID Panel     Status: None   Collection Time: 11/27/20  8:23 PM   Specimen: BLOOD  Result Value Ref Range Status   Specimen Description BLOOD LAC  Final   Special Requests   Final    BOTTLES DRAWN AEROBIC AND  ANAEROBIC Blood Culture results may not be optimal due to an inadequate volume  of blood received in culture bottles   Culture   Final    NO GROWTH 5 DAYS Performed at [facility], [address] [place], [place] [address]    Report Status 12/02/2020 FINAL  Final  CULTURE, BLOOD (ROUTINE X 2) w Reflex to ID Panel     Status: None   Collection Time: 11/27/20  9:32 PM   Specimen: BLOOD LEFT HAND  Result Value Ref Range Status   Specimen Description BLOOD LEFT HAND  Final   Special Requests   Final    BOTTLES DRAWN AEROBIC AND ANAEROBIC Blood Culture adequate volume   Culture   Final    NO GROWTH 5 DAYS Performed at [facility], [address] [place], [place] [address]    Report Status 12/02/2020 FINAL  Final  CULTURE, BLOOD (ROUTINE X 2) w Reflex to ID Panel     Status: None (Preliminary result)   Collection Time: 12/06/20 10:35 AM   Specimen: BLOOD  Result Value Ref Range Status   Specimen Description BLOOD LT KNUCKLE  Final   Special Requests   Final    BOTTLES DRAWN AEROBIC AND ANAEROBIC Blood Culture adequate volume   Culture   Final    NO GROWTH < 24 HOURS Performed at [facility], [address], [place], [place] [address]    Report Status PENDING  Incomplete  CULTURE, BLOOD (ROUTINE X 2) w Reflex to ID Panel     Status: None (Preliminary result)   Collection Time: 12/06/20 10:35 AM   Specimen: BLOOD  Result Value Ref Range Status   Specimen Description BLOOD BLOOD LEFT WRIST  Final   Special Requests AEROBIC BOTTLE ONLY Blood Culture adequate volume  Final   Culture   Final    NO GROWTH < 24 HOURS Performed at [facility], [address] [place], [place] [address]    Report Status PENDING  Incomplete    Coagulation Studies: No results for input(s): LABPROT, INR in the last 72 hours.  Urinalysis: No results for input(s): COLORURINE, LABSPEC, PHURINE, GLUCOSEU, HGBUR, BILIRUBINUR, KETONESUR, PROTEINUR, UROBILINOGEN,  NITRITE, LEUKOCYTESUR in the last 72 hours.  Invalid input(s): APPERANCEUR     Imaging: No results found.   Medications:      Chlorhexidine Gluconate Cloth  6 each Topical Daily   diltiazem  120 mg Oral Daily   feeding supplement  1 Container Oral TID BM   hydrALAZINE  50 mg Oral Q8H   insulin aspart  0-15 Units Subcutaneous TID WC   insulin aspart  7 Units Subcutaneous TID WC   insulin glargine-yfgn  15 Units Subcutaneous BID   levothyroxine  100 mcg Oral QAC breakfast   magnesium oxide  800 mg Oral BID   mirtazapine  15 mg Oral QHS   multivitamin with minerals  1 tablet Oral Daily   pantoprazole  40 mg Oral Daily   psyllium  1 packet Oral Daily   rosuvastatin  10 mg Oral QHS   sodium bicarbonate  650 mg Oral TID   sodium chloride flush  10-40 mL Intracatheter Q12H   thiamine  100 mg Oral Daily   acetaminophen **OR** acetaminophen, albuterol, haloperidol lactate, loperamide, ondansetron **OR** ondansetron (ZOFRAN) IV, polyethylene glycol, sodium chloride flush  Assessment/ Plan:  [name] is a 55 y.o.  female with medical conditions including pneumococcal meningitis bacteremia, hypothyroidism, Type 2 diabetes, and lymphoma in remission. She presents to the ED with confusion and is admitted for Altered mental status [  R41.82] Encephalopathy acute [G93.40] Altered mental status, unspecified altered mental status type [R41.82] Sepsis with encephalopathy without septic shock, due to unspecified organism ([place]) [A41.9, R65.20, G93.40]   Acute Kidney Injury with baseline creatinine 0.49 on 11/22/20.  Acute kidney injury secondary to contrast induced ATN. IV contrast exposure on November 21, 2020.  UA shows protienuria, >50 RBCs, 21-50 WBC, microbiology 20k colonies- Enterobacter. Renal ultrasound negative for mass and obstruction. Questionable small urterocele. No acute need for dialysis at this time Creatinine continues to increase UA improved and protein creatinine  ratio increased Complements decreased and ANCA positive. Due to conflicting results, will retest for ANCA and other labs to verify results Will continue to monitor closely  Lab Results  Component Value Date   CREATININE 3.72 (H) 12/08/2020   CREATININE 3.38 (H) 12/07/2020   CREATININE 3.22 (H) 12/06/2020    Intake/Output Summary (Last 24 hours) at 12/08/2020 1230 Last data filed at 12/08/2020 1021 Gross per 24 hour  Intake 298 ml  Output --  Net 298 ml    2. Anemia  with history of CLL pancytopenia Lab Results  Component Value Date   HGB 7.4 (L) 12/08/2020  Transfused 1 unit RBC on 12/04/20 Bone marrow biopsy 12/04/20, results pending Oncology following    LOS: [place] 8/15/202212:30 PM

## 2020-12-08 NOTE — Progress Notes (Signed)
Physical Therapy Treatment Patient Details Name: Tonya Myers MRN: [id number] DOB: 1966/02/26 Today's Date: 12/08/2020    History of Present Illness Pt admitted to [facility] on 11/19/20 for decreased appetite, vomiting, AMS, and lethargy. On EMS assessment, pt was found to have fever 102 and AMS with confusion and left sided weakness.  Found to have pancytopenia, oncology was consulted due to hx of CLL, but so far no concern of CLL relapse. ID also on board, initially concerning for meningitis due to hx of pneumococcal meningitis. PMH significant for: DDD, cervical radiculopathy, anxiety, DM, HTN, hypothyroidism, CLL, asthma, cLBP, vertigo, and hx of stroke.    PT Comments    Pt ready for session.  In chair with nursing +2 assist this morning.  Stood with +2 min/mod a x 2.  Very unsteady initially with time to gain balance before gait.  She walks about 19' with min a x 2 and recliner follow (+3).  She fatigues quickly with decreasing gait quality and is cued to sit.  MD and RN in room after gait to discuss and she is returned to room for visit.   Follow Up Recommendations  Other (comment);Home health PT;Supervision/Assistance - 24 hour     Equipment Recommendations       Recommendations for Other Services       Precautions / Restrictions Precautions Precautions: Fall Restrictions Weight Bearing Restrictions: No    Mobility  Bed Mobility               General bed mobility comments: in recliner before and after session    Transfers Overall transfer level: Needs assistance Equipment used: Rolling walker (2 wheeled) Transfers: Sit to/from Stand Sit to Stand: Min assist;Mod assist;+2 physical assistance            Ambulation/Gait Ambulation/Gait assistance: +2 safety/equipment;Min assist Gait Distance (Feet): 15 Feet Assistive device: Rolling walker (2 wheeled) Gait Pattern/deviations: Step-to pattern;Trunk flexed Gait velocity: decreased   General Gait Details:  generally unsteady today but is able to walk with +2 and a 3rd assist for recliner follow.   Stairs             Wheelchair Mobility    Modified Rankin (Stroke Patients Only)       Balance Overall balance assessment: Needs assistance Sitting-balance support: No upper extremity supported;Feet supported Sitting balance-Leahy Scale: Good     Standing balance support: Bilateral upper extremity supported;During functional activity Standing balance-Leahy Scale: Poor Standing balance comment: more unsteady today requries hands on assist +2 at all times for safety                            Cognition Arousal/Alertness: Awake/alert Behavior During Therapy: WFL for tasks assessed/performed;Flat affect Overall Cognitive Status: Within Functional Limits for tasks assessed                                        Exercises      General Comments        Pertinent Vitals/Pain Pain Assessment: No/denies pain    Home Living                      Prior Function            PT Goals (current goals can now be found in the care plan section) Progress towards PT goals: Progressing toward  goals    Frequency    7X/week      PT Plan Current plan remains appropriate    Co-evaluation              AM-PAC PT "6 Clicks" Mobility   Outcome Measure  Help needed turning from your back to your side while in a flat bed without using bedrails?: A Little Help needed moving from lying on your back to sitting on the side of a flat bed without using bedrails?: A Little Help needed moving to and from a bed to a chair (including a wheelchair)?: A Lot Help needed standing up from a chair using your arms (e.g., wheelchair or bedside chair)?: A Lot Help needed to walk in hospital room?: A Lot Help needed climbing 3-5 steps with a railing? : Total 6 Click Score: 13    End of Session Equipment Utilized During Treatment: Gait belt Activity  Tolerance: Patient tolerated treatment well Patient left: in chair;with call bell/phone within reach;with chair alarm set Nurse Communication: Mobility status PT Visit Diagnosis: Unsteadiness on feet (R26.81);Muscle weakness (generalized) (M62.81);Difficulty in walking, not elsewhere classified (R26.2);Hemiplegia and hemiparesis Hemiplegia - Right/Left: Right     Time: 6010-9323 PT Time Calculation (min) (ACUTE ONLY): 16 min  Charges:  $Gait Training: 8-22 mins                   Chesley Noon, PTA 12/08/20, 12:53 PM Chesley Noon 12/08/2020, 12:50 PM

## 2020-12-08 NOTE — Progress Notes (Signed)
Date of Admission:  11/19/2020      ID: Tonya Myers is a 55 y.o. female  Principal Problem:   Subacute delirium Active Problems:   DDD (degenerative disc disease), cervical   Aphasia   Anemia of chronic disease   Acute focal neurological deficit   Encephalitis   Altered mental status   Encephalopathy acute    Subjective: Patient is feeling tired Has many questions about her diagnosis No fever   Medications:   Chlorhexidine Gluconate Cloth  6 each Topical Daily   diltiazem  120 mg Oral Daily   feeding supplement  1 Container Oral TID BM   hydrALAZINE  50 mg Oral Q8H   insulin aspart  0-15 Units Subcutaneous TID WC   insulin aspart  7 Units Subcutaneous TID WC   insulin glargine-yfgn  15 Units Subcutaneous BID   levothyroxine  100 mcg Oral QAC breakfast   magnesium oxide  800 mg Oral BID   mirtazapine  15 mg Oral QHS   multivitamin with minerals  1 tablet Oral Daily   pantoprazole  40 mg Oral Daily   psyllium  1 packet Oral Daily   rosuvastatin  10 mg Oral QHS   sodium bicarbonate  650 mg Oral TID   sodium chloride flush  10-40 mL Intracatheter Q12H   thiamine  100 mg Oral Daily    Objective: Vital signs in last 24 hours: Temp:  [97.5 F (36.4 C)-98.2 F (36.8 C)] 97.6 F (36.4 C) (08/15 1153) Pulse Rate:  [56-80] 80 (08/15 1153) Resp:  [16-20] 20 (08/15 1153) BP: (113-142)/(59-76) 138/76 (08/15 1153) SpO2:  [95 %-100 %] 100 % (08/15 1153)  PHYSICAL EXAM:  General: Alert, cooperative, no distress, chronically ill and pale head: Normocephalic, without obvious abnormality, atraumatic. Eyes: Conjunctivae clear, anicteric sclerae. Pupils are equal ENT Nares normal. No drainage or sinus tenderness. Lips, mucosa, and tongue normal. No Thrush Neck: Supple, symmetrical, no adenopathy, thyroid: non tender no carotid bruit and no JVD.  Lungs: Lateral air entry  Heart: S1-S2 abdomen: Soft, non-tender,not distended. Bowel sounds normal. No masses Extremities:  atraumatic, no cyanosis. No edema. No clubbing Skin: No rashes or lesions. Or bruising Lymph: Cervical, supraclavicular normal. Neurologic: Weakness of lower extremity.  Lab Results Recent Labs    12/07/20 0511 12/08/20 0719  WBC 5.1 7.6  HGB 7.4* 7.4*  HCT 21.8* 22.4*  NA 132* 136  K 4.5 4.6  CL 109 110  CO2 18* 21*  BUN 42* 56*  CREATININE 3.38* 3.72*     Infectious disease work up 11/19/2020 blood culture no growth 11/20/2020 CSF culture no growth HSV PCR in CSF nonreactive VDRL nonreactive Crypto antigen nonreactive 11/12/2020 meningitis encephalitis panel nonreactive. 11/27/2020 urine culture Enterobacter serologies 11/27/2020 blood culture no growth   Ultrasound kidney- no hydro Intraluminal cystic focus posterior RIGHT bladder, favor representing a small RIGHT ureterocele.   2d echo-No veg     Assessment/Plan:  Patient presented with encephalopathy and pancytopenia on 11/19/2020.  Had fever on admission.  Lumbar puncture revealed neutrophilic pleocytosis but the cell count was only 50.  There was also increased protein in CSF.  Meningitis encephalitis PCR panel was negative and infection was ruled out.  Cytology was negative. Encephalopathy resolved.  She was also given thiamine high-dose.  She was noted to have pancreatitis incidental finding on CT.  She did not have much of abdominal pain.  Intermittent fever for the past 7 to 10 days.  She has been worked up for fever  of unknown origin.Marland Kitchen  MRI of the brain and spine did not show any residual infection Blood cultures were negative She was treated for Enterobacter aerogenes in the urine.t she did not have any pyelonephritis.  Cortisol and TSH was normal.  She was also given a trial of doxycycline for 3 days for any tickborne illness with no improvement.   AKI a few days into admission.  Initially thought to be contrast-induced.  That the work-up revealed very low C3-C4, ANA being positive, and positive for p-ANCA,  mildly elevated dsDNA and Smith antibody..  Over the weekend she received high-dose steroids for possible vasculitis. E worsening AKI with seeing the urine and proteinuria suggestive of nephritis. As needed Seen by rheumatologist and acute l SLE is questioned. Patient is awaiting renal biopsy. She has low platelet.  History of CLL in remission. Because of FUO relapse was questioned and she underwent a bone marrow biopsy.  The reading today was the bone marrow material was extremely limited for evaluation but appears to be trilineage hematopoiesis with relative abundance of granulocytic cells.  There was no apparent morphological evidence of a lymphoproliferative process.  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  She needed surgical intervention and also completed 6 weeks of IV antibiotic.  There is no residual infection now.  Discussed the management with the patient and her aunt at bedside  ID will sign off as currently there is no active infection.

## 2020-12-08 NOTE — Progress Notes (Signed)
Nutrition Follow-up  DOCUMENTATION CODES:  Not applicable  INTERVENTION:  Continue current diet as ordered, encourage PO intake Snacks TID between meals MVI with minerals daily Boost Breeze po TID, each supplement provides 250 kcal and 9 grams of protein  NUTRITION DIAGNOSIS:  Inadequate oral intake related to  (altered mental status) as evidenced by meal completion < 25%.  GOAL:  Patient will meet greater than or equal to 90% of their needs - progressing  MONITOR:  PO intake, Supplement acceptance, Labs, Weight trends  REASON FOR ASSESSMENT:  Consult Poor PO  ASSESSMENT:  55 y.o. female with a past medical history of anxiety/depression, CLL, diabetes, HLD, and HTN presented to ED with AMS and poor appetite.  Since last assessment, nephrology and oncology consulted to evaluate cause of continued decline in renal function. Pt has hx of CLL in remission since 2016. Underwent bone marrow biopsy 8/11 and nephrology ordered an autoimmune work-up. Several markers resulted positive, likely vasculitis. Recommended a renal biopsy.   Pt with better PO intake this week. Continues to consume snacks and supplements. MD notes encephalopathy has resolved and pt is back to her baseline. Glucose noted to be very elevated after receiving steroids. Weight is stable.   Average Meal Intake: 7/29-8/2: 0% intake x 4 recorded meals 8/3-8/8: 3% intake x 4 recorded meals 8/9-8/14: 38% intake x 4 recorded meals  Nutritionally Relevant Medications: Scheduled Meds:  feeding supplement  1 Container Oral TID BM   insulin aspart  0-15 Units Subcutaneous TID WC   insulin aspart  5 Units Subcutaneous TID WC   insulin glargine-yfgn  12 Units Subcutaneous BID   levothyroxine  100 mcg Oral QAC breakfast   magnesium oxide  800 mg Oral BID   mirtazapine  15 mg Oral QHS   multivitamin with minerals  1 tablet Oral Daily   pantoprazole  40 mg Oral Daily   psyllium  1 packet Oral Daily   rosuvastatin  10 mg  Oral QHS   sodium bicarbonate  650 mg Oral TID   thiamine  100 mg Oral Daily   PRN Meds: loperamide, ondansetron, polyethylene glycol  Labs Reviewed: Creatinine 3.72, BUN 56 SBG ranges from 358-391 mg/dL over the last 24 hours HgbA1c 6.5% (10/02/20)  NUTRITION - FOCUSED PHYSICAL EXAM: Flowsheet Row Most Recent Value  Orbital Region No depletion  Upper Arm Region No depletion  Thoracic and Lumbar Region No depletion  Buccal Region No depletion  Temple Region No depletion  Clavicle Bone Region Mild depletion  Clavicle and Acromion Bone Region No depletion  Scapular Bone Region No depletion  Dorsal Hand No depletion  Patellar Region No depletion  Anterior Thigh Region No depletion  Posterior Calf Region No depletion  Edema (RD Assessment) Mild  Hair Reviewed  Eyes Reviewed  Mouth Reviewed  Skin Reviewed  Nails Reviewed   Diet Order:   Diet Order             Diet Carb Modified Fluid consistency: Thin; Room service appropriate? Yes  Diet effective now                  EDUCATION NEEDS:  No education needs have been identified at this time  Skin:  Skin Assessment: Reviewed RN Assessment  Last BM:  8/12  Height:  Ht Readings from Last 1 Encounters:  11/20/20 6' (1.829 m)   Weight:  Wt Readings from Last 1 Encounters:  12/01/20 94.4 kg    Ideal Body Weight:  72.7 kg  BMI:  Body mass index is 28.23 kg/m.  Estimated Nutritional Needs:  Kcal:  2000-2200 kcal/d Protein:  100-110 g/d Fluid:  >2L/d  Ranell Patrick, RD, LDN Clinical Dietitian Pager on [place]

## 2020-12-09 ENCOUNTER — Other Ambulatory Visit: Payer: Self-pay | Admitting: Oncology

## 2020-12-09 DIAGNOSIS — R4182 Altered mental status, unspecified: Secondary | ICD-10-CM | POA: Diagnosis not present

## 2020-12-09 DIAGNOSIS — A419 Sepsis, unspecified organism: Secondary | ICD-10-CM | POA: Diagnosis not present

## 2020-12-09 DIAGNOSIS — R410 Disorientation, unspecified: Secondary | ICD-10-CM | POA: Diagnosis not present

## 2020-12-09 DIAGNOSIS — C9110 Chronic lymphocytic leukemia of B-cell type not having achieved remission: Secondary | ICD-10-CM | POA: Diagnosis not present

## 2020-12-09 DIAGNOSIS — R6520 Severe sepsis without septic shock: Secondary | ICD-10-CM

## 2020-12-09 DIAGNOSIS — R509 Fever, unspecified: Secondary | ICD-10-CM | POA: Diagnosis not present

## 2020-12-09 LAB — CBC WITH DIFFERENTIAL/PLATELET
Abs Immature Granulocytes: 0.07 10*3/uL (ref 0.00–0.07)
Basophils Absolute: 0 10*3/uL (ref 0.0–0.1)
Basophils Relative: 0 %
Eosinophils Absolute: 0 10*3/uL (ref 0.0–0.5)
Eosinophils Relative: 0 %
HCT: 21.9 % — ABNORMAL LOW (ref 36.0–46.0)
Hemoglobin: 7.4 g/dL — ABNORMAL LOW (ref 12.0–15.0)
Immature Granulocytes: 1 %
Lymphocytes Relative: 14 %
Lymphs Abs: 1.2 10*3/uL (ref 0.7–4.0)
MCH: 27.2 pg (ref 26.0–34.0)
MCHC: 33.8 g/dL (ref 30.0–36.0)
MCV: 80.5 fL (ref 80.0–100.0)
Monocytes Absolute: 0.5 10*3/uL (ref 0.1–1.0)
Monocytes Relative: 6 %
Neutro Abs: 7 10*3/uL (ref 1.7–7.7)
Neutrophils Relative %: 79 %
Platelets: 28 10*3/uL — CL (ref 150–400)
RBC: 2.72 MIL/uL — ABNORMAL LOW (ref 3.87–5.11)
RDW: 17 % — ABNORMAL HIGH (ref 11.5–15.5)
WBC: 8.9 10*3/uL (ref 4.0–10.5)
nRBC: 0 % (ref 0.0–0.2)

## 2020-12-09 LAB — HEPATITIS B CORE ANTIBODY, TOTAL: Hep B Core Total Ab: NONREACTIVE

## 2020-12-09 LAB — PROTIME-INR
INR: 1.1 (ref 0.8–1.2)
Prothrombin Time: 14.1 seconds (ref 11.4–15.2)

## 2020-12-09 LAB — HEPATITIS B SURFACE ANTIGEN: Hepatitis B Surface Ag: NONREACTIVE

## 2020-12-09 LAB — BASIC METABOLIC PANEL
Anion gap: 6 (ref 5–15)
BUN: 67 mg/dL — ABNORMAL HIGH (ref 6–20)
CO2: 21 mmol/L — ABNORMAL LOW (ref 22–32)
Calcium: 7.9 mg/dL — ABNORMAL LOW (ref 8.9–10.3)
Chloride: 109 mmol/L (ref 98–111)
Creatinine, Ser: 4.1 mg/dL — ABNORMAL HIGH (ref 0.44–1.00)
GFR, Estimated: 12 mL/min — ABNORMAL LOW (ref >60)
Glucose, Bld: 257 mg/dL — ABNORMAL HIGH (ref 70–99)
Potassium: 4.7 mmol/L (ref 3.5–5.1)
Sodium: 136 mmol/L (ref 135–145)

## 2020-12-09 LAB — QUANTIFERON-TB GOLD PLUS: QuantiFERON-TB Gold Plus: UNDETERMINED — AB

## 2020-12-09 LAB — HEPATIC FUNCTION PANEL
ALT: 9 U/L (ref 0–44)
AST: 37 U/L (ref 15–41)
Albumin: 2.2 g/dL — ABNORMAL LOW (ref 3.5–5.0)
Alkaline Phosphatase: 72 U/L (ref 38–126)
Bilirubin, Direct: 0.1 mg/dL (ref 0.0–0.2)
Indirect Bilirubin: 0.4 mg/dL (ref 0.3–0.9)
Total Bilirubin: 0.5 mg/dL (ref 0.3–1.2)
Total Protein: 5.5 g/dL — ABNORMAL LOW (ref 6.5–8.1)

## 2020-12-09 LAB — GLUCOSE, CAPILLARY
Glucose-Capillary: 222 mg/dL — ABNORMAL HIGH (ref 70–99)
Glucose-Capillary: 245 mg/dL — ABNORMAL HIGH (ref 70–99)
Glucose-Capillary: 248 mg/dL — ABNORMAL HIGH (ref 70–99)
Glucose-Capillary: 272 mg/dL — ABNORMAL HIGH (ref 70–99)

## 2020-12-09 LAB — QUANTIFERON-TB GOLD PLUS (RQFGPL)
QuantiFERON Mitogen Value: 0.15 IU/mL
QuantiFERON Nil Value: 0 IU/mL
QuantiFERON TB1 Ag Value: 0 IU/mL
QuantiFERON TB2 Ag Value: 0 IU/mL

## 2020-12-09 LAB — APTT: aPTT: 26 seconds (ref 24–36)

## 2020-12-09 LAB — LACTATE DEHYDROGENASE: LDH: 232 U/L — ABNORMAL HIGH (ref 98–192)

## 2020-12-09 LAB — PATHOLOGIST SMEAR REVIEW

## 2020-12-09 MED ORDER — AMLODIPINE BESYLATE 5 MG PO TABS
5.0000 mg | ORAL_TABLET | Freq: Every day | ORAL | Status: DC
  Administered 2020-12-10 – 2020-12-22 (×13): 5 mg via ORAL
  Filled 2020-12-09 (×13): qty 1

## 2020-12-09 MED ORDER — INSULIN GLARGINE-YFGN 100 UNIT/ML ~~LOC~~ SOLN
18.0000 [IU] | Freq: Two times a day (BID) | SUBCUTANEOUS | Status: DC
  Administered 2020-12-09 – 2020-12-10 (×2): 18 [IU] via SUBCUTANEOUS
  Filled 2020-12-09 (×4): qty 0.18

## 2020-12-09 MED ORDER — INSULIN ASPART 100 UNIT/ML IJ SOLN
9.0000 [IU] | Freq: Three times a day (TID) | INTRAMUSCULAR | Status: DC
  Administered 2020-12-09 – 2020-12-10 (×3): 9 [IU] via SUBCUTANEOUS
  Filled 2020-12-09 (×3): qty 1

## 2020-12-09 NOTE — Evaluation (Addendum)
Occupational Therapy Re-evaluation Patient Details Name: Tonya Myers MRN: [id number] DOB: 08/16/1965 Today's Date: 12/09/2020    History of Present Illness Pt admitted to [facility] on 11/19/20 for decreased appetite, vomiting, AMS, and lethargy. On EMS assessment, pt was found to have fever 102 and AMS with confusion and left sided weakness.  Found to have pancytopenia, oncology was consulted due to hx of CLL, but so far no concern of CLL relapse. ID also on board, initially concerning for meningitis due to hx of pneumococcal meningitis. PMH significant for: DDD, cervical radiculopathy, anxiety, DM, HTN, hypothyroidism, CLL, asthma, cLBP, vertigo, and hx of stroke.   Clinical Impression   Pt seen for OT re-evaluation on this date in setting of prolonged hospitalization. Upon arrival to room, pt awake and seated upright in bed with family present. Pt verbalizing concern regarding current health condition and declining to participate in ADLs/functional mobility until discussion with MD regarding future medical treatment, however pt requesting to participate in UE therapeutic exercises with OT this date.   Pt provided with HEP consisting of hand exercises with theraputty (yellow theraputty for R hand and green theraputty for L hand) and UE exercises with red theraband (see below). Pt instructed to perform x5-10 reps of each exercise 3x/day. Pt verbalized understanding and handout provided to facilitate implementation into daily routine. Pt is making good progress toward goals and goals have been updated based on current functional abilities. Pt continues to benefit from skilled OT services to maximize return to PLOF and minimize risk of future falls, injury, caregiver burden, and readmission. Continue to recommend [name] upon discharge d/t pt continuing to require 2-person assist for all ADLs/functional mobility (however pt prefers to go home with home health OT).       Follow Up Recommendations  [name]     Equipment Recommendations  Other (comment) (defer to next venue of care)       Precautions / Restrictions Precautions Precautions: Fall Restrictions Weight Bearing Restrictions: No      Mobility Bed Mobility               General bed mobility comments: pt declining all mobility this date    Transfers  pt declining all mobility this date                          ADL either performed or assessed with clinical judgement   ADL                                         General ADL Comments: Pt declining to participate in ADLs this date      Pertinent Vitals/Pain Pain Assessment: No/denies pain              Cognition Arousal/Alertness: Awake/alert Behavior During Therapy: WFL for tasks assessed/performed;Flat affect Overall Cognitive Status: Within Functional Limits for tasks assessed                                 General Comments: Pt verbalizing concern regarding decline in health, however motivated to participate in therapy and requesting OT session this date to focus on UE strengthening.      Exercises General Exercises - Upper Extremity Shoulder Flexion: Strengthening;Both;10 reps;Theraband;Supine Theraband Level (Shoulder Flexion): Level 2 (Red) Shoulder Horizontal ABduction: Strengthening;Both;10 reps;Theraband;Supine Theraband Level (Shoulder  Horizontal Abduction): Level 2 (Red) Elbow Flexion: Strengthening;Both;10 reps;Theraband;Supine Theraband Level (Elbow Flexion): Level 2 (Red) Hand Exercises Digit Composite Flexion: Strengthening;Both;10 reps;Other (comment) (with yellow/green theraputty) Composite Extension: Strengthening;Both;5 reps;Other (comment) (with yellow/green theraputty) Digit Composite Adduction: Strengthening;Both;5 reps;Other (comment) (with yellow/green theraputty) Opposition: Strengthening;Both;5 reps;Other (comment) (with yellow/green theraputty)        OT Treatment/Interventions:       OT Goals(Current goals can be found in the care plan section) Acute Rehab OT Goals Patient Stated Goal: to get stronger at home OT Goal Formulation: With patient/family Time For Goal Achievement: 12/23/20 Potential to Achieve Goals: Good ADL Goals Pt Will Transfer to Toilet: with min assist;with +2 assist;stand pivot transfer;bedside commode Pt Will Perform Toileting - Clothing Manipulation and hygiene: with mod assist;sitting/lateral leans  OT Frequency: Min 3X/week    AM-PAC OT "6 Clicks" Daily Activity     Outcome Measure Help from another person eating meals?: A Little Help from another person taking care of personal grooming?: A Little Help from another person toileting, which includes using toliet, bedpan, or urinal?: A Lot Help from another person bathing (including washing, rinsing, drying)?: A Lot Help from another person to put on and taking off regular upper body clothing?: A Little Help from another person to put on and taking off regular lower body clothing?: A Lot 6 Click Score: 15   End of Session Nurse Communication: Mobility status  Activity Tolerance: Patient tolerated treatment well Patient left: in bed;with call bell/phone within reach;with bed alarm set;with family/visitor present  OT Visit Diagnosis: Muscle weakness (generalized) (M62.81);Other symptoms and signs involving cognitive function                Time: 0945 (MD entered room from 9:55-10:06)-1027 OT Time Calculation (min): 42 min Charges:  OT General Charges $OT Visit: 1 Visit OT Evaluation $OT Re-eval: 1 Re-eval OT Treatments $Therapeutic Activity: 23-37 mins  Fredirick Maudlin, OTR/L [place]

## 2020-12-09 NOTE — Consult Note (Signed)
Reason for Consult: Rule out connective tissue disease  Referring Physician: Hospitalist  Tonya Myers   HPI: Complicated story.  55 year old African-American female.  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  Osteoarthritis.  Had epidural abscess requiring thoracic laminectomy in the spring History of CLL.  Treated 2016 rituximab several treatments.  Remission.  3 months ago white count was 6300 platelets 343,000 3 weeks ago she was admitted oral ulcers thought to be related to thrush.  Had encephalopathy.  Evaluation for infection including LP had negative culture.  She had 158 protein.  No significant cells.  Was found to be pancytopenic with white count 2300 hemoglobin 8 platelets down to 63. Note progressive renal insufficiency now with a creatinine of 4.  Has greater than 300 proteinuria with 6-10 red cells. Protein to creatinine ratio 1.98 during hospitalization she had elevated ANA with ENA's including anti-Smith antibody and chromatin antibody and low titer anti-Ro and RNP antibodies.  Anti-DNA was only 11.  Low complement 22 low C4 at 4.  ANCA at 1.60 In questioning she said 2 months prior to this hospitalization she did have fingers change color cold on the right hand some of the left.  Started having her hand stiffness and swelling even prior to this hospitalization. She has not had photosensitive skin rash.  There is been no family history of lupus or connective tissue disease.  Platelets remain low.  Had bone marrow biopsy did not show recurrence of CLL.  She is scheduled for renal biopsy tomorrow as creatinine is down to four-point   PMH: As above.  Diabetes.  Osteoarthritis of knees.  Hypertension  SURGICAL HISTORY: Thoracic laminectomy for epidural abscess  Family History: Negative for connective tissue disease no significant cigarettes or alcohol  Social History:no cigs   Allergies:  Allergies  Allergen Reactions   Amoxapine    Penicillins      Medications: Scheduled:  Chlorhexidine Gluconate Cloth  6 each Topical Daily   diltiazem  120 mg Oral Daily   feeding supplement  1 Container Oral TID BM   hydrALAZINE  50 mg Oral Q8H   insulin aspart  0-15 Units Subcutaneous TID WC   insulin aspart  9 Units Subcutaneous TID WC   insulin glargine-yfgn  18 Units Subcutaneous BID   levothyroxine  100 mcg Oral QAC breakfast   magnesium oxide  800 mg Oral BID   mirtazapine  15 mg Oral QHS   multivitamin with minerals  1 tablet Oral Daily   pantoprazole  40 mg Oral Daily   psyllium  1 packet Oral Daily   rosuvastatin  10 mg Oral QHS   sodium bicarbonate  650 mg Oral TID   sodium chloride flush  10-40 mL Intracatheter Q12H   thiamine  100 mg Oral Daily        ROS: As above.  No shortness of breath chest pain.  No diarrhea no photosensitive skin rashes   PHYSICAL EXAM: Blood pressure 123/67, pulse 61, temperature 98.8 F (37.1 C), resp. rate 17, height 6' (1.829 m), weight 94.4 kg, SpO2 100 %. Pleasant female.  No acute distress.  Answers questions appropriately.  No rash.  No alopecia.  Oropharynx without oral ulcers.  Neck is supple.  Chest clear.  No murmur nontender abdomen trace edema Musculoskeletal: Good range of motion neck and shoulders.  Wrists MCPs PIPs are stiff.  Right hand is mildly weak on the right compared to the left.  Hips move well.  Both knees have small cool effusions.  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  She has numerous features including -Pancytopenia -Acute renal insufficiency with proteinuria, despite IV steroids -Suspect recent history of Raynaud's -Positive serologies and low complement though anti-DNA is not very high.  Does have anti-SM, Ro and RNP -Hand arthralgias -No evidence of cutaneous involvement Less likely ANCA related illness clinic Prior epidural abscess requiring surgery Diabetes insulin-dependent History of CLL apparently  remission  Recommendations: Agree with renal biopsy.  Ultimately therapy will depend on that Consider discontinuing hydralazine given risk of drug-induced lupus  Tonya Myers 12/09/2020, 1:20 PM

## 2020-12-09 NOTE — Progress Notes (Addendum)
[facility]  ROUNDING NOTE   Subjective:   Tonya Myers is a 55 y.o. female with medical conditions including pneumococcal meningitis bacteremia, hypothyroidism, Type 2 diabetes, and lymphoma in remission. She presents to the ED with confusion and is admitted for Altered mental status [R41.82] Encephalopathy acute [G93.40] Altered mental status, unspecified altered mental status type [R41.82] Sepsis with encephalopathy without septic shock, due to unspecified organism ([place]) [A41.9, R65.20, G93.40]  Patient resting in bed  Alert Concerned of recent results and quick progression of illness Patient seen later with husband and aunt at bedside Patient gave verbal consent to speak freely with visitors at bedside.  Tolerating meals, but states food is tasting different Also states she has tremors in arms and less fine motor abilities   Objective:  Vital signs in last 24 hours:  Temp:  [97.6 F (36.4 C)-98.6 F (37 C)] 98.5 F (36.9 C) (08/16 0815) Pulse Rate:  [48-80] 51 (08/16 0815) Resp:  [16-20] 17 (08/16 0815) BP: (114-138)/(62-76) 136/65 (08/16 0815) SpO2:  [96 %-100 %] 96 % (08/16 0815)  Weight change:  Filed Weights   11/20/20 0507 12/01/20 0920 12/01/20 1730  Weight: 83 kg 94.9 kg 94.4 kg    Intake/Output: No intake/output data recorded.   Intake/Output this shift:  Total I/O In: -  Out: 700 [Urine:700]  Physical Exam: General: NAD, laying in bed  Head: Normocephalic, atraumatic. Moist oral mucosal membranes  Eyes: Anicteric  Lungs:  Clear to auscultation, normal effort  Heart: Regular rate and rhythm  Abdomen:  Soft, nontender  Extremities: trace peripheral edema.  Neurologic: Alert, moving all four extremities  Skin: No lesions       Basic Metabolic Panel: Recent Labs  Lab 12/03/20 0521 12/04/20 0609 12/05/20 0521 12/06/20 1035 12/07/20 0511 12/08/20 0719 12/09/20 0512  NA 138 136 138 135 132* 136 136  K 3.8 3.7 3.6 4.3 4.5 4.6  4.7  CL 111 109 113* 108 109 110 109  CO2 22 20* 20* 16* 18* 21* 21*  GLUCOSE 88 79 64* 361* 395* 394* 257*  BUN 19 19 23* 31* 42* 56* 67*  CREATININE 2.41* 2.66* 2.88* 3.22* 3.38* 3.72* 4.10*  CALCIUM 7.6* 7.7* 7.7* 7.7* 7.6* 7.9* 7.9*  MG 1.8 1.7 1.7  --   --  2.0  --      Liver Function Tests: Recent Labs  Lab 12/03/20 1140  AST 62*  ALT 12  ALKPHOS 75  BILITOT 0.6  PROT 5.0*  ALBUMIN 1.9*    Recent Labs  Lab 12/03/20 0521 12/04/20 0609 12/05/20 0521 12/06/20 1035 12/07/20 0511  LIPASE 190* 149* 130* 90* 135*    No results for input(s): AMMONIA in the last 168 hours.  CBC: Recent Labs  Lab 12/05/20 0521 12/06/20 1035 12/07/20 0511 12/08/20 0719 12/09/20 0512  WBC 3.0* 2.6* 5.1 7.6 8.9  NEUTROABS 1.9 2.1 4.1 6.5 7.0  HGB 7.6* 7.9* 7.4* 7.4* 7.4*  HCT 22.8* 23.9* 21.8* 22.4* 21.9*  MCV 82.0 81.8 79.3* 81.2 80.5  PLT 43* 35* 34* 30* 28*     Cardiac Enzymes: Recent Labs  Lab 12/03/20 1140  CKTOTAL 19*     BNP: Invalid input(s): POCBNP  CBG: Recent Labs  Lab 12/08/20 1201 12/08/20 1309 12/08/20 1614 12/08/20 2048 12/09/20 0811  GLUCAP 425* 440* 380* 329* 222*     Microbiology: Results for orders placed or performed during the hospital encounter of 11/19/20  Blood culture (routine x 2)     Status: None  Collection Time: 11/19/20  4:47 PM   Specimen: BLOOD  Result Value Ref Range Status   Specimen Description BLOOD RIGHT ANTECUBITAL  Final   Special Requests   Final    BOTTLES DRAWN AEROBIC AND ANAEROBIC Blood Culture adequate volume   Culture   Final    NO GROWTH 5 DAYS Performed at [facility], [address] [place], [place] [address]    Report Status 11/24/2020 FINAL  Final  Resp Panel by RT-PCR (Flu A&B, Covid) Nasopharyngeal Swab     Status: None   Collection Time: 11/19/20  4:48 PM   Specimen: Nasopharyngeal Swab; Nasopharyngeal(NP) swabs in vial transport medium  Result Value Ref Range Status   SARS  Coronavirus 2 by RT PCR NEGATIVE NEGATIVE Final    Comment: (NOTE) SARS-CoV-2 target nucleic acids are NOT DETECTED.  The SARS-CoV-2 RNA is generally detectable in upper respiratory specimens during the acute phase of infection. The lowest concentration of SARS-CoV-2 viral copies this assay can detect is 138 copies/mL. A negative result does not preclude SARS-Cov-2 infection and should not be used as the sole basis for treatment or other patient management decisions. A negative result may occur with  improper specimen collection/handling, submission of specimen other than nasopharyngeal swab, presence of viral mutation(s) within the areas targeted by this assay, and inadequate number of viral copies(<138 copies/mL). A negative result must be combined with clinical observations, patient history, and epidemiological information. The expected result is Negative.  Fact Sheet for Patients:  EntrepreneurPulse.com.au  Fact Sheet for Healthcare Providers:  IncredibleEmployment.be  This test is no t yet approved or cleared by the [place] FDA and  has been authorized for detection and/or diagnosis of SARS-CoV-2 by FDA under an Emergency Use Authorization (EUA). This EUA will remain  in effect (meaning this test can be used) for the duration of the COVID-19 declaration under Section 564(b)(1) of the Act, 21 U.S.C.section 360bbb-3(b)(1), unless the authorization is terminated  or revoked sooner.       Influenza A by PCR NEGATIVE NEGATIVE Final   Influenza B by PCR NEGATIVE NEGATIVE Final    Comment: (NOTE) The Xpert Xpress SARS-CoV-2/FLU/RSV plus assay is intended as an aid in the diagnosis of influenza from Nasopharyngeal swab specimens and should not be used as a sole basis for treatment. Nasal washings and aspirates are unacceptable for Xpert Xpress SARS-CoV-2/FLU/RSV testing.  Fact Sheet for  Patients: EntrepreneurPulse.com.au  Fact Sheet for Healthcare Providers: IncredibleEmployment.be  This test is not yet approved or cleared by the [place] FDA and has been authorized for detection and/or diagnosis of SARS-CoV-2 by FDA under an Emergency Use Authorization (EUA). This EUA will remain in effect (meaning this test can be used) for the duration of the COVID-19 declaration under Section 564(b)(1) of the Act, 21 U.S.C. section 360bbb-3(b)(1), unless the authorization is terminated or revoked.  Performed at [facility], [address] [place], [place] [address]   Blood culture (routine x 2)     Status: None   Collection Time: 11/19/20  6:46 PM   Specimen: BLOOD  Result Value Ref Range Status   Specimen Description BLOOD RIGHT ANTECUBITAL  Final   Special Requests   Final    BOTTLES DRAWN AEROBIC AND ANAEROBIC Blood Culture adequate volume   Culture   Final    NO GROWTH 5 DAYS Performed at [facility], [address] [place], [place] [address]    Report Status 11/24/2020 FINAL  Final  Urine Culture  Status: Abnormal   Collection Time: 11/19/20  9:33 PM   Specimen: Urine, Clean Catch  Result Value Ref Range Status   Specimen Description   Final    URINE, CLEAN CATCH Performed at [facility], [address], [place], [place] [address]    Special Requests   Final    NONE Performed at [facility], [address] [place], [place] [address]    Culture (A)  Final    <10,000 COLONIES/mL INSIGNIFICANT GROWTH Performed at [place] [facility] [address] [place], [place] [address]    Report Status 11/21/2020 FINAL  Final  Culture, fungus without smear     Status: None (Preliminary result)   Collection Time: 11/20/20 11:09 AM   Specimen: CSF; Cerebrospinal Fluid  Result Value Ref Range Status   Specimen Description   Final    CSF Performed at [facility], [address], [place], [place] [address]    Special Requests   Final    NONE Performed at [facility], [address], [place], [place] [address]    Culture   Final    NO FUNGUS ISOLATED AFTER 18 DAYS Performed at [facility], [place] [address] [place], [place] [address]    Report Status PENDING  Incomplete  CSF culture w Gram Stain     Status: None   Collection Time: 11/20/20 11:09 AM   Specimen: PATH Cytology CSF; Cerebrospinal Fluid  Result Value Ref Range Status   Specimen Description   Final    CSF Performed at [facility], [address], [place], [place] [address]    Special Requests   Final    NONE Performed at [facility], [address] [place], [place] [address]    Gram Stain   Final    NO ORGANISMS SEEN WBC SEEN RED BLOOD CELLS PRESENT Performed at [facility], [address] [place], [place] [address]    Culture   Final    NO GROWTH 3 DAYS Performed at [facility], [place] [address] [place], [place] [address]    Report Status 11/23/2020 FINAL  Final  Fungus Culture With Stain     Status: None (Preliminary result)   Collection Time: 11/20/20 11:09 AM   Specimen: PATH Cytology CSF; Cerebrospinal Fluid  Result Value Ref Range Status   Fungus Stain Final report  Final    Comment: (NOTE) Performed At: [facility] [place], [place] [id number] Rush Farmer MD [id number]    Fungus (Mycology) Culture PENDING  Incomplete   Fungal Source CSF  Final    Comment: Performed at [facility], [address] [place],  [address]  Fungus Culture Result     Status: None   Collection Time: 11/20/20 11:09 AM  Result Value Ref Range Status   Result 1 Comment  Final    Comment: (NOTE) KOH/Calcofluor preparation:  no fungus observed. Performed At: [facility] [place], [place] [id number] Rush Farmer MD [id number]   Resp Panel by RT-PCR (Flu A&B,  Covid) Nasopharyngeal Swab     Status: None   Collection Time: 11/27/20  1:39 PM   Specimen: Nasopharyngeal Swab; Nasopharyngeal(NP) swabs in vial transport medium  Result Value Ref Range Status   SARS Coronavirus 2 by RT PCR NEGATIVE NEGATIVE Final    Comment: (NOTE) SARS-CoV-2 target nucleic acids are NOT DETECTED.  The SARS-CoV-2 RNA is generally detectable in upper respiratory specimens during  the acute phase of infection. The lowest concentration of SARS-CoV-2 viral copies this assay can detect is 138 copies/mL. A negative result does not preclude SARS-Cov-2 infection and should not be used as the sole basis for treatment or other patient management decisions. A negative result may occur with  improper specimen collection/handling, submission of specimen other than nasopharyngeal swab, presence of viral mutation(s) within the areas targeted by this assay, and inadequate number of viral copies(<138 copies/mL). A negative result must be combined with clinical observations, patient history, and epidemiological information. The expected result is Negative.  Fact Sheet for Patients:  EntrepreneurPulse.com.au  Fact Sheet for Healthcare Providers:  IncredibleEmployment.be  This test is no t yet approved or cleared by the [place] FDA and  has been authorized for detection and/or diagnosis of SARS-CoV-2 by FDA under an Emergency Use Authorization (EUA). This EUA will remain  in effect (meaning this test can be used) for the duration of the COVID-19 declaration under Section 564(b)(1) of the Act, 21 U.S.C.section 360bbb-3(b)(1), unless the authorization is terminated  or revoked sooner.       Influenza A by PCR NEGATIVE NEGATIVE Final   Influenza B by PCR NEGATIVE NEGATIVE Final    Comment: (NOTE) The Xpert Xpress SARS-CoV-2/FLU/RSV plus assay is intended as an aid in the diagnosis of influenza from Nasopharyngeal swab specimens and should  not be used as a sole basis for treatment. Nasal washings and aspirates are unacceptable for Xpert Xpress SARS-CoV-2/FLU/RSV testing.  Fact Sheet for Patients: EntrepreneurPulse.com.au  Fact Sheet for Healthcare Providers: IncredibleEmployment.be  This test is not yet approved or cleared by the [place] FDA and has been authorized for detection and/or diagnosis of SARS-CoV-2 by FDA under an Emergency Use Authorization (EUA). This EUA will remain in effect (meaning this test can be used) for the duration of the COVID-19 declaration under Section 564(b)(1) of the Act, 21 U.S.C. section 360bbb-3(b)(1), unless the authorization is terminated or revoked.  Performed at [facility], [address] [place], [place] [address]   Urine Culture     Status: Abnormal   Collection Time: 11/27/20  2:15 PM   Specimen: Urine, Clean Catch  Result Value Ref Range Status   Specimen Description   Final    URINE, CLEAN CATCH Performed at [facility], [address], [place], [place] [address]    Special Requests   Final    NONE Performed at [facility], [place], [place] [address]    Culture 20,000 COLONIES/mL ENTEROBACTER AEROGENES (A)  Final   Report Status 11/30/2020 FINAL  Final   Organism ID, Bacteria ENTEROBACTER AEROGENES (A)  Final      Susceptibility   Enterobacter aerogenes - MIC*    CEFAZOLIN >=64 RESISTANT Resistant     CEFEPIME 1 SENSITIVE Sensitive     CEFTRIAXONE >=64 RESISTANT Resistant     CIPROFLOXACIN <=0.25 SENSITIVE Sensitive     GENTAMICIN <=1 SENSITIVE Sensitive     IMIPENEM <=0.25 SENSITIVE Sensitive     NITROFURANTOIN 64 INTERMEDIATE Intermediate     TRIMETH/SULFA <=20 SENSITIVE Sensitive     PIP/TAZO >=128 RESISTANT Resistant     * 20,000 COLONIES/mL ENTEROBACTER AEROGENES  CULTURE, BLOOD (ROUTINE X 2) w Reflex to ID Panel     Status: None   Collection Time: 11/27/20  8:23 PM    Specimen: BLOOD  Result Value Ref Range Status   Specimen Description BLOOD LAC  Final   Special Requests   Final  BOTTLES DRAWN AEROBIC AND ANAEROBIC Blood Culture results may not be optimal due to an inadequate volume of blood received in culture bottles   Culture   Final    NO GROWTH 5 DAYS Performed at [facility], [address] [place], [place] [address]    Report Status 12/02/2020 FINAL  Final  CULTURE, BLOOD (ROUTINE X 2) w Reflex to ID Panel     Status: None   Collection Time: 11/27/20  9:32 PM   Specimen: BLOOD LEFT HAND  Result Value Ref Range Status   Specimen Description BLOOD LEFT HAND  Final   Special Requests   Final    BOTTLES DRAWN AEROBIC AND ANAEROBIC Blood Culture adequate volume   Culture   Final    NO GROWTH 5 DAYS Performed at [facility], [address] [place], [place] [address]    Report Status 12/02/2020 FINAL  Final  CULTURE, BLOOD (ROUTINE X 2) w Reflex to ID Panel     Status: None (Preliminary result)   Collection Time: 12/06/20 10:35 AM   Specimen: BLOOD  Result Value Ref Range Status   Specimen Description BLOOD LT KNUCKLE  Final   Special Requests   Final    BOTTLES DRAWN AEROBIC AND ANAEROBIC Blood Culture adequate volume   Culture   Final    NO GROWTH 3 DAYS Performed at [facility], [address] [place], [place] [address]    Report Status PENDING  Incomplete  CULTURE, BLOOD (ROUTINE X 2) w Reflex to ID Panel     Status: None (Preliminary result)   Collection Time: 12/06/20 10:35 AM   Specimen: BLOOD  Result Value Ref Range Status   Specimen Description BLOOD BLOOD LEFT WRIST  Final   Special Requests AEROBIC BOTTLE ONLY Blood Culture adequate volume  Final   Culture   Final    NO GROWTH 3 DAYS Performed at [facility], [address] [place],  [address]    Report Status PENDING  Incomplete    Coagulation Studies: No results for input(s): LABPROT, INR in the last 72  hours.  Urinalysis: No results for input(s): COLORURINE, LABSPEC, PHURINE, GLUCOSEU, HGBUR, BILIRUBINUR, KETONESUR, PROTEINUR, UROBILINOGEN, NITRITE, LEUKOCYTESUR in the last 72 hours.  Invalid input(s): APPERANCEUR     Imaging: No results found.   Medications:      Chlorhexidine Gluconate Cloth  6 each Topical Daily   diltiazem  120 mg Oral Daily   feeding supplement  1 Container Oral TID BM   hydrALAZINE  50 mg Oral Q8H   insulin aspart  0-15 Units Subcutaneous TID WC   insulin aspart  7 Units Subcutaneous TID WC   insulin glargine-yfgn  15 Units Subcutaneous BID   levothyroxine  100 mcg Oral QAC breakfast   magnesium oxide  800 mg Oral BID   mirtazapine  15 mg Oral QHS   multivitamin with minerals  1 tablet Oral Daily   pantoprazole  40 mg Oral Daily   psyllium  1 packet Oral Daily   rosuvastatin  10 mg Oral QHS   sodium bicarbonate  650 mg Oral TID   sodium chloride flush  10-40 mL Intracatheter Q12H   thiamine  100 mg Oral Daily   acetaminophen **OR** acetaminophen, albuterol, haloperidol lactate, loperamide, ondansetron **OR** ondansetron (ZOFRAN) IV, polyethylene glycol, sodium chloride flush  Assessment/ Plan:  [name] is a 55 y.o.  female with medical conditions including pneumococcal meningitis bacteremia, hypothyroidism, Type 2 diabetes, and lymphoma in  remission. She presents to the ED with confusion and is admitted for Altered mental status [R41.82] Encephalopathy acute [G93.40] Altered mental status, unspecified altered mental status type [R41.82] Sepsis with encephalopathy without septic shock, due to unspecified organism ([place]) [A41.9, R65.20, G93.40]   Acute Kidney Injury/hematuria/ proteinuria with baseline creatinine 0.49 on 11/22/20.  Acute kidney injury secondary to contrast induced ATN. IV contrast exposure on November 21, 2020.  UA shows protienuria, >50 RBCs, 21-50 WBC, microbiology 20k colonies- Enterobacter. Renal ultrasound negative for  mass and obstruction. Questionable small urterocele. No acute need for dialysis at this time Complements decreased and ANCA positive. Due to conflicting results, will retest for ANCA and other labs to verify results - Due to continued renal function decline, renal biopsy was dicussed with care team as a viable option for diagnosis. Radiologist feels the bleeding risk is too great with low platelets and Hgb. Will order blood products in the meantime to correct these values. Were also considering dialysis initiation due to worsening renal function. Will place this on hold while other diagnosis are evaluated. - Will continue to monitor closely  Lab Results  Component Value Date   CREATININE 4.10 (H) 12/09/2020   CREATININE 3.72 (H) 12/08/2020   CREATININE 3.38 (H) 12/07/2020    Intake/Output Summary (Last 24 hours) at 12/09/2020 0848 Last data filed at 12/09/2020 7096 Gross per 24 hour  Intake 0 ml  Output 700 ml  Net -700 ml    2. Anemia  with history of CLL pancytopenia Lab Results  Component Value Date   HGB 7.4 (L) 12/09/2020  Transfused 1 unit RBC on 12/04/20 Bone marrow biopsy 12/04/20, limited marrow material with trilineage hematopoiesis. Repeat biopsy recommended.  Oncology following    LOS: [place] 8/16/20228:48 AM

## 2020-12-09 NOTE — Progress Notes (Signed)
PT Cancellation Note  Patient Details Name: Tonya Myers MRN: [id number] DOB: 1966-03-26   Cancelled Treatment:    Reason Eval/Treat Not Completed: Other (comment)  Offered session. Pt awaiting MD.  Stated "things have gotten worse."  She declines session at this time.  OT in room after MD and stated pt did not want to get up today but requested some equipment of her.  She will relay if pt is wanting further PT interventions today.  If  not will continue at a later time/date.   Chesley Noon 12/09/2020, 10:01 AM

## 2020-12-09 NOTE — Progress Notes (Signed)
PROGRESS NOTE    Tonya Myers  BLT:[id number] DOB: [birth year] DOA: 11/19/2020 PCP: [name], MD   Brief Narrative:  55 y.o. female with medical history significant for recent pneumococcal meningitis and bacteremia, depression, hypothyroid, IDDM2, presents to the ED for chief concern of altered mentation.  Extended hospitalization.  Etiology of encephalopathy is unclear.  Encephalopathy has resolved and patient has baseline level of mentation however she began to spike intermittent fevers associated with progressive worsening of kidney function.  Nephrology and infectious disease both following.  Patient is on IV antibiotics and intravenous fluids.  Work-up has been unrevealing for infectious cause.  Progressively worsening renal failure.  Serologic work-up ANA positive, ANCA positive, hypocomplementemia.  Suspect autoimmune process, likely vasculitis.  Started on high-dose intravenous pulse steroids on 8/12.  No fever since initiation of high-dose steroids.  Bone marrow biopsy results negative for obvious evidence of hematologic malignancy.  Poor sample however.  Trilineage hematopoiesis noted.  Oncology aware.  Patient remains pancytopenic.  Kidney function continues to deteriorate.  Will need kidney biopsy.  Will need coordination between oncology and nephrology as far as timing as patient will likely need platelet transfusion before.  Assessment & Plan:   Principal Problem:   Subacute delirium Active Problems:   DDD (degenerative disc disease), cervical   Aphasia   Anemia of chronic disease   Acute focal neurological deficit   Encephalitis   Altered mental status   Encephalopathy acute  Positive ANA Positive autoimmune serologies Serologic work-up indicative of underlying autoimmune process, likely vasculitis.  Still unable to completely rule out infectious etiology. Completed 3 days high-dose steroid Creatinine worse Plan: Daily kidney function, creat continues to  worsen Follow blood cultures, drawn 8/14, NGDT Monitor vitals and fever curve We will reach out to rheumatology for recommendations and evaluation  Fever, recurrent Hx of epidural and paraspinal abscess daily fevers  repeat covid neg.  No cough or dyspnea to suggest PNA.  Procal increased. MRI brain, spine 8/5, no obvious infection Case discussed at length with ID No clear infectious focus Status post ciprofloxacin and doxycycline Fevers have abated since high-dose steroid Four extremity Doppler negative for VTE Inflammatory markers elevated but downtrending Last documented fever 101.8 on 8/12 Plan: Monitor vitals and fever curve off of antibiotics Follow-up bone marrow biopsy, inconclusive  AKI --baseline ~0.7.  Urine appeared very dark.   --nephrology consulted --renal US showed medical renal disease Creatinine continues to worsen Plan: Nephrology following for AKI.  Serologic work-up indicates autoimmune process, likely vasculitis.  Creatinine worsening.  Will need kidney biopsy.  Will likely need platelet transfusion prior to intervention.  Nephrology and oncology aware   Acute pancreatitis Unclear etiology No reported history of alcohol intake, no radiographic evidence of gallstones Possible drug-induced however unclear exactly which drug could have driven this Initial abdominal examination negative for tenderness Lipase trending down with IVF, but much slowly than usual. Triglycerides negative Plan: Patient tolerating p.o. intake.  Suspect the pancreatitis is resolved If continued concern for pancreatic abscess versus pseudocyst a contrast-enhanced CT scan will need to be done which currently is not possible due to progressive renal failure   Acute toxic metabolic encephalopathy, resolved Unclear etiology --meningitis ruled out by LP.  CT head neg for new stroke.  Hematology ruled out CLL as the culprit.  No seizure on EEG. --mental status dramatically improved with  treatment of pancreatitis and IV thiamine repletion. 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 intake is driven by appetite rather than pain --s/p Diflucan for 3 doses   Insulin-dependent diabetes mellitus Hypoglycemic episodes Patient is on long-acting formulation 50 units daily at home in addition to metformin --A1c 6.5 High-dose steroids started 8/12 Plan: -- Increase glargine 8 units daily --SSI   Hyperlipidemia --cont statin   Hypothyroid --cont Synthroid   HTN --cont cardizem and hydralazine   DVT prophylaxis: TED hose Code Status: Full code Family Communication: [name] [phone] on 8/15, bedside on 8/16 Disposition Plan:Status is: Inpatient  Remains inpatient appropriate because:Inpatient level of care appropriate due to severity of illness  Dispo: The patient is from: Home              Anticipated d/c is to: Home              Patient currently is not medically stable to d/c.   Difficult to place patient No  Progressive renal failure.  Persistent pancytopenia.  Serologic work-up indicative of autoimmune process.  Will need kidney biopsy.     Level of care: Med-Surg  Consultants:  Nephrology ID  Procedures: LP Bone marrow  biopsy  Antimicrobials:  Ciprofloxacin   Subjective: Seen and examined.  Sitting comfortably in bed.  Stable this AM.  Husband at bedside.  Objective: Vitals:   12/08/20 2047 12/09/20 0018 12/09/20 0606 12/09/20 0815  BP: 124/68 115/66 114/69 136/65  Pulse: (!) 52 (!) 49 (!) 48 (!) 51  Resp: 16 16 16 17   Temp: 98.6 F (37 C) 98.3 F (36.8 C) 98 F (36.7 C) 98.5 F (36.9 C)  TempSrc:      SpO2: 100% 100% 96% 96%  Weight:      Height:        Intake/Output Summary (Last 24 hours) at 12/09/2020 1036 Last data filed at 12/09/2020 5638 Gross per 24 hour  Intake --  Output 700 ml  Net -700 ml   Filed Weights   11/20/20 0507 12/01/20 0920 12/01/20 1730  Weight: 83 kg 94.9 kg 94.4 kg    Examination:  General exam: No apparent distress Respiratory system: Clear to auscultation. Respiratory effort normal. Cardiovascular system: S1-S2, regular rate and rhythm, no murmurs, no pedal edema  gastrointestinal system: Soft, nontender, nondistended, normal bowel sounds Central nervous system: Alert and oriented. No focal neurological deficits. Extremities: Symmetric 5 x 5 power. Skin: No rashes, lesions or ulcers Psychiatry: Judgement and insight appear normal. Mood & affect appropriate.     Data Reviewed: I have personally reviewed following labs and imaging studies  CBC: Recent Labs  Lab 12/05/20 0521 12/06/20 1035 12/07/20 0511 12/08/20 0719 12/09/20 0512  WBC 3.0* 2.6* 5.1 7.6 8.9  NEUTROABS 1.9 2.1 4.1 6.5 7.0  HGB 7.6* 7.9* 7.4* 7.4* 7.4*  HCT 22.8* 23.9* 21.8* 22.4* 21.9*  MCV 82.0 81.8 79.3* 81.2 80.5  PLT 43* 35* 34* 30* 28*   Basic Metabolic Panel:  Recent Labs  Lab 12/03/20 0521 12/04/20 0609 12/05/20 0521 12/06/20 1035 12/07/20 0511 12/08/20 0719 12/09/20 0512  NA 138 136 138 135 132* 136 136  K 3.8 3.7 3.6 4.3 4.5 4.6 4.7  CL 111 109 113* 108 109 110 109  CO2 22 20* 20* 16* 18* 21* 21*  GLUCOSE 88 79 64* 361* 395* 394* 257*  BUN 19 19 23* 31*  42* 56* 67*  CREATININE 2.41* 2.66* 2.88* 3.22* 3.38* 3.72* 4.10*  CALCIUM 7.6* 7.7* 7.7* 7.7* 7.6* 7.9* 7.9*  MG 1.8 1.7 1.7  --   --  2.0  --    GFR: Estimated Creatinine Clearance: 19.5 mL/min (A) (by C-G formula based on SCr of 4.1 mg/dL (H)). Liver Function Tests: Recent Labs  Lab 12/03/20 1140  AST 62*  ALT 12  ALKPHOS 75  BILITOT 0.6  PROT 5.0*  ALBUMIN 1.9*   Recent Labs  Lab 12/03/20 0521 12/04/20 0609 12/05/20 0521 12/06/20 1035 12/07/20 0511  LIPASE 190* 149* 130* 90* 135*   No results for input(s): AMMONIA in the last 168 hours. Coagulation Profile: No results for input(s): INR, PROTIME in the last 168 hours. Cardiac Enzymes: Recent Labs  Lab 12/03/20 1140  CKTOTAL 19*   BNP (last 3 results) No results for input(s): PROBNP in the last 8760 hours. HbA1C: No results for input(s): HGBA1C in the last 72 hours. CBG: Recent Labs  Lab 12/08/20 1201 12/08/20 1309 12/08/20 1614 12/08/20 2048 12/09/20 0811  GLUCAP 425* 440* 380* 329* 222*   Lipid Profile: No results for input(s): CHOL, HDL, LDLCALC, TRIG, CHOLHDL, LDLDIRECT in the last 72 hours. Thyroid Function Tests: No results for input(s): TSH, T4TOTAL, FREET4, T3FREE, THYROIDAB in the last 72 hours. Anemia Panel: No results for input(s): VITAMINB12, FOLATE, FERRITIN, TIBC, IRON, RETICCTPCT in the last 72 hours.  Sepsis Labs: Recent Labs  Lab 12/03/20 0521 12/04/20 0609  PROCALCITON 1.71 2.13    Recent Results (from the past 240 hour(s))  CULTURE, BLOOD (ROUTINE X 2) w Reflex to ID Panel     Status: None (Preliminary result)   Collection Time: 12/06/20 10:35 AM   Specimen: BLOOD  Result Value Ref Range Status   Specimen Description BLOOD LT KNUCKLE  Final   Special Requests   Final    BOTTLES DRAWN AEROBIC AND ANAEROBIC Blood Culture adequate volume   Culture   Final    NO GROWTH 3 DAYS Performed at [facility], [address], [place], [place] [address]    Report Status  PENDING  Incomplete  CULTURE, BLOOD (ROUTINE X 2) w Reflex to ID Panel     Status: None (Preliminary result)   Collection Time: 12/06/20 10:35 AM   Specimen: BLOOD  Result Value Ref Range Status   Specimen Description BLOOD BLOOD LEFT WRIST  Final   Special Requests AEROBIC BOTTLE ONLY Blood Culture adequate volume  Final   Culture   Final    NO GROWTH 3 DAYS Performed at [facility], [address], [place], [place] [address]    Report Status PENDING  Incomplete         Radiology Studies: No results found.      Scheduled Meds:  Chlorhexidine Gluconate Cloth  6 each Topical Daily   diltiazem  120 mg Oral Daily   feeding supplement  1 Container Oral TID BM   hydrALAZINE  50 mg Oral Q8H   insulin aspart  0-15 Units Subcutaneous TID WC   insulin aspart  9 Units  Subcutaneous TID WC   insulin glargine-yfgn  18 Units Subcutaneous BID   levothyroxine  100 mcg Oral QAC breakfast   magnesium oxide  800 mg Oral BID   mirtazapine  15 mg Oral QHS   multivitamin with minerals  1 tablet Oral Daily   pantoprazole  40 mg Oral Daily   psyllium  1 packet Oral Daily   rosuvastatin  10 mg Oral QHS   sodium bicarbonate  650 mg Oral TID   sodium chloride flush  10-40 mL Intracatheter Q12H   thiamine  100 mg Oral Daily   Continuous Infusions:      LOS: 19 days    Time spent: 35 minutes    Sidney Ace, MD [facility] Pager [phone]  If 7PM-7AM, please contact night-coverage 12/09/2020, 10:36 AM

## 2020-12-10 ENCOUNTER — Encounter
Admission: EM | Disposition: A | Discharge status: Home Health | Payer: Self-pay | Source: Home / Self Care | Attending: Internal Medicine

## 2020-12-10 DIAGNOSIS — A419 Sepsis, unspecified organism: Secondary | ICD-10-CM | POA: Diagnosis not present

## 2020-12-10 DIAGNOSIS — R4182 Altered mental status, unspecified: Secondary | ICD-10-CM | POA: Diagnosis not present

## 2020-12-10 DIAGNOSIS — C9110 Chronic lymphocytic leukemia of B-cell type not having achieved remission: Secondary | ICD-10-CM | POA: Diagnosis not present

## 2020-12-10 DIAGNOSIS — N179 Acute kidney failure, unspecified: Secondary | ICD-10-CM

## 2020-12-10 DIAGNOSIS — R410 Disorientation, unspecified: Secondary | ICD-10-CM | POA: Diagnosis not present

## 2020-12-10 DIAGNOSIS — D8989 Other specified disorders involving the immune mechanism, not elsewhere classified: Secondary | ICD-10-CM | POA: Diagnosis not present

## 2020-12-10 HISTORY — PX: TEMPORARY DIALYSIS CATHETER: CATH118312

## 2020-12-10 LAB — CBC WITH DIFFERENTIAL/PLATELET
Abs Immature Granulocytes: 0.08 10*3/uL — ABNORMAL HIGH (ref 0.00–0.07)
Basophils Absolute: 0 10*3/uL (ref 0.0–0.1)
Basophils Relative: 0 %
Eosinophils Absolute: 0.1 10*3/uL (ref 0.0–0.5)
Eosinophils Relative: 1 %
HCT: 22.3 % — ABNORMAL LOW (ref 36.0–46.0)
Hemoglobin: 7.4 g/dL — ABNORMAL LOW (ref 12.0–15.0)
Immature Granulocytes: 1 %
Lymphocytes Relative: 21 %
Lymphs Abs: 1.7 10*3/uL (ref 0.7–4.0)
MCH: 26.8 pg (ref 26.0–34.0)
MCHC: 33.2 g/dL (ref 30.0–36.0)
MCV: 80.8 fL (ref 80.0–100.0)
Monocytes Absolute: 0.4 10*3/uL (ref 0.1–1.0)
Monocytes Relative: 5 %
Neutro Abs: 5.7 10*3/uL (ref 1.7–7.7)
Neutrophils Relative %: 72 %
Platelets: 21 10*3/uL — CL (ref 150–400)
RBC: 2.76 MIL/uL — ABNORMAL LOW (ref 3.87–5.11)
RDW: 17.1 % — ABNORMAL HIGH (ref 11.5–15.5)
WBC: 8 10*3/uL (ref 4.0–10.5)
nRBC: 0 % (ref 0.0–0.2)

## 2020-12-10 LAB — BASIC METABOLIC PANEL
Anion gap: 7 (ref 5–15)
BUN: 75 mg/dL — ABNORMAL HIGH (ref 6–20)
CO2: 21 mmol/L — ABNORMAL LOW (ref 22–32)
Calcium: 8.3 mg/dL — ABNORMAL LOW (ref 8.9–10.3)
Chloride: 109 mmol/L (ref 98–111)
Creatinine, Ser: 4.42 mg/dL — ABNORMAL HIGH (ref 0.44–1.00)
GFR, Estimated: 11 mL/min — ABNORMAL LOW (ref >60)
Glucose, Bld: 132 mg/dL — ABNORMAL HIGH (ref 70–99)
Potassium: 4.7 mmol/L (ref 3.5–5.1)
Sodium: 137 mmol/L (ref 135–145)

## 2020-12-10 LAB — GLUCOSE, CAPILLARY
Glucose-Capillary: 107 mg/dL — ABNORMAL HIGH (ref 70–99)
Glucose-Capillary: 136 mg/dL — ABNORMAL HIGH (ref 70–99)
Glucose-Capillary: 127 mg/dL — ABNORMAL HIGH (ref 70–99)

## 2020-12-10 LAB — HAPTOGLOBIN: Haptoglobin: 64 mg/dL (ref 33–346)

## 2020-12-10 LAB — HEPATITIS B SURFACE ANTIBODY, QUANTITATIVE: Hep B S AB Quant (Post): <3.1 m[IU]/mL — ABNORMAL LOW (ref >9.9)

## 2020-12-10 SURGERY — TEMPORARY DIALYSIS CATHETER
Anesthesia: Moderate Sedation

## 2020-12-10 MED ORDER — SODIUM CHLORIDE 0.9% IV SOLUTION: Freq: Once | INTRAVENOUS | Status: AC

## 2020-12-10 MED ORDER — LIDOCAINE-EPINEPHRINE (PF) 1 %-1:200000 IJ SOLN
INTRAMUSCULAR | Status: DC | PRN
Start: 2020-12-10 — End: 2020-12-10
  Administered 2020-12-10: 10 mL via INTRADERMAL

## 2020-12-10 SURGICAL SUPPLY — 4 items
GOWN SRG XL LVL 3 NONREINFORCE (GOWNS) ×1 IMPLANT
GOWN STRL NON-REIN TWL XL LVL3 (GOWNS) ×1
GUIDEWIRE AMPLATZ SHORT (WIRE) ×2 IMPLANT
KIT DIALYSIS CATH TRI 30X13 (CATHETERS) ×2 IMPLANT

## 2020-12-10 NOTE — Progress Notes (Signed)
[facility]  ROUNDING NOTE   Subjective:   Tonya Myers is a 55 y.o. female with medical conditions including pneumococcal meningitis bacteremia, hypothyroidism, Type 2 diabetes, and lymphoma in remission. She presents to the ED with confusion and is admitted for Altered mental status [R41.82] Encephalopathy acute [G93.40] Altered mental status, unspecified altered mental status type [R41.82] Sepsis with encephalopathy without septic shock, due to unspecified organism ([facility]) [A41.9, R65.20, G93.40]  Patient resting in bed  Husband at bedside Alert and oriented Tolerating meals but states it has started to taste different Denies nausea and vomiting Remains on room air Eager to know today's plan, but concerned about lab results   Objective:  Vital signs in last 24 hours:  Temp:  [98.1 F (36.7 C)-99.4 F (37.4 C)] 98.1 F (36.7 C) (08/17 0804) Pulse Rate:  [61-66] 63 (08/17 0804) Resp:  [16-18] 16 (08/17 0804) BP: (121-140)/(62-84) 129/70 (08/17 0804) SpO2:  [98 %-100 %] 100 % (08/17 0804)  Weight change:  Filed Weights   11/20/20 0507 12/01/20 0920 12/01/20 1730  Weight: 83 kg 94.9 kg 94.4 kg    Intake/Output: I/O last 3 completed shifts: In: 0  Out: 1350 [Urine:1350]   Intake/Output this shift:  No intake/output data recorded.  Physical Exam: General: NAD, laying in bed  Head: Normocephalic, atraumatic. Moist oral mucosal membranes  Eyes: Anicteric  Lungs:  Clear to auscultation, normal effort  Heart: Regular rate and rhythm  Abdomen:  Soft, nontender  Extremities: trace peripheral edema.  Neurologic: Alert, moving all four extremities  Skin: No lesions       Basic Metabolic Panel: Recent Labs  Lab 12/04/20 0609 12/05/20 0521 12/06/20 1035 12/07/20 0511 12/08/20 0719 12/09/20 0512 12/10/20 0508  NA 136 138 135 132* 136 136 137  K 3.7 3.6 4.3 4.5 4.6 4.7 4.7  CL 109 113* 108 109 110 109 109  CO2 20* 20* 16* 18* 21* 21* 21*  GLUCOSE  79 64* 361* 395* 394* 257* 132*  BUN 19 23* 31* 42* 56* 67* 75*  CREATININE 2.66* 2.88* 3.22* 3.38* 3.72* 4.10* 4.42*  CALCIUM 7.7* 7.7* 7.7* 7.6* 7.9* 7.9* 8.3*  MG 1.7 1.7  --   --  2.0  --   --      Liver Function Tests: Recent Labs  Lab 12/03/20 1140 12/09/20 1020  AST 62* 37  ALT 12 9  ALKPHOS 75 72  BILITOT 0.6 0.5  PROT 5.0* 5.5*  ALBUMIN 1.9* 2.2*    Recent Labs  Lab 12/04/20 0609 12/05/20 0521 12/06/20 1035 12/07/20 0511  LIPASE 149* 130* 90* 135*    No results for input(s): AMMONIA in the last 168 hours.  CBC: Recent Labs  Lab 12/06/20 1035 12/07/20 0511 12/08/20 0719 12/09/20 0512 12/10/20 0508  WBC 2.6* 5.1 7.6 8.9 8.0  NEUTROABS 2.1 4.1 6.5 7.0 5.7  HGB 7.9* 7.4* 7.4* 7.4* 7.4*  HCT 23.9* 21.8* 22.4* 21.9* 22.3*  MCV 81.8 79.3* 81.2 80.5 80.8  PLT 35* 34* 30* 28* 21*     Cardiac Enzymes: Recent Labs  Lab 12/03/20 1140  CKTOTAL 19*     BNP: Invalid input(s): POCBNP  CBG: Recent Labs  Lab 12/09/20 0811 12/09/20 1203 12/09/20 1631 12/09/20 1936 12/10/20 0751  GLUCAP 222* 245* 248* 9* 107*     Microbiology: Results for orders placed or performed during the hospital encounter of 11/19/20  Blood culture (routine x 2)     Status: None   Collection Time: 11/19/20  4:47 PM  Specimen: BLOOD  Result Value Ref Range Status   Specimen Description BLOOD RIGHT ANTECUBITAL  Final   Special Requests   Final    BOTTLES DRAWN AEROBIC AND ANAEROBIC Blood Culture adequate volume   Culture   Final    NO GROWTH 5 DAYS Performed at [facility], [address] [place], [place] [address]    Report Status 11/24/2020 FINAL  Final  Resp Panel by RT-PCR (Flu A&B, Covid) Nasopharyngeal Swab     Status: None   Collection Time: 11/19/20  4:48 PM   Specimen: Nasopharyngeal Swab; Nasopharyngeal(NP) swabs in vial transport medium  Result Value Ref Range Status   SARS Coronavirus 2 by RT PCR NEGATIVE NEGATIVE Final    Comment:  (NOTE) SARS-CoV-2 target nucleic acids are NOT DETECTED.  The SARS-CoV-2 RNA is generally detectable in upper respiratory specimens during the acute phase of infection. The lowest concentration of SARS-CoV-2 viral copies this assay can detect is 138 copies/mL. A negative result does not preclude SARS-Cov-2 infection and should not be used as the sole basis for treatment or other patient management decisions. A negative result may occur with  improper specimen collection/handling, submission of specimen other than nasopharyngeal swab, presence of viral mutation(s) within the areas targeted by this assay, and inadequate number of viral copies(<138 copies/mL). A negative result must be combined with clinical observations, patient history, and epidemiological information. The expected result is Negative.  Fact Sheet for Patients:  EntrepreneurPulse.com.au  Fact Sheet for Healthcare Providers:  IncredibleEmployment.be  This test is no t yet approved or cleared by the [place] FDA and  has been authorized for detection and/or diagnosis of SARS-CoV-2 by FDA under an Emergency Use Authorization (EUA). This EUA will remain  in effect (meaning this test can be used) for the duration of the COVID-19 declaration under Section 564(b)(1) of the Act, 21 U.S.C.section 360bbb-3(b)(1), unless the authorization is terminated  or revoked sooner.       Influenza A by PCR NEGATIVE NEGATIVE Final   Influenza B by PCR NEGATIVE NEGATIVE Final    Comment: (NOTE) The Xpert Xpress SARS-CoV-2/FLU/RSV plus assay is intended as an aid in the diagnosis of influenza from Nasopharyngeal swab specimens and should not be used as a sole basis for treatment. Nasal washings and aspirates are unacceptable for Xpert Xpress SARS-CoV-2/FLU/RSV testing.  Fact Sheet for Patients: EntrepreneurPulse.com.au  Fact Sheet for Healthcare  Providers: IncredibleEmployment.be  This test is not yet approved or cleared by the [place] FDA and has been authorized for detection and/or diagnosis of SARS-CoV-2 by FDA under an Emergency Use Authorization (EUA). This EUA will remain in effect (meaning this test can be used) for the duration of the COVID-19 declaration under Section 564(b)(1) of the Act, 21 U.S.C. section 360bbb-3(b)(1), unless the authorization is terminated or revoked.  Performed at [facility], [address] [place], [place] [address]   Blood culture (routine x 2)     Status: None   Collection Time: 11/19/20  6:46 PM   Specimen: BLOOD  Result Value Ref Range Status   Specimen Description BLOOD RIGHT ANTECUBITAL  Final   Special Requests   Final    BOTTLES DRAWN AEROBIC AND ANAEROBIC Blood Culture adequate volume   Culture   Final    NO GROWTH 5 DAYS Performed at [facility], [address] [place], [place] [address]    Report Status 11/24/2020 FINAL  Final  Urine Culture     Status: Abnormal   Collection  Time: 11/19/20  9:33 PM   Specimen: Urine, Clean Catch  Result Value Ref Range Status   Specimen Description   Final    URINE, CLEAN CATCH Performed at [facility], [address] [place], [place] [address]    Special Requests   Final    NONE Performed at [facility], [address] [place], [place] [address]    Culture (A)  Final    <10,000 COLONIES/mL INSIGNIFICANT GROWTH Performed at [facility] [address], [place], [place] [address]    Report Status 11/21/2020 FINAL  Final  Culture, fungus without smear     Status: None (Preliminary result)   Collection Time: 11/20/20 11:09 AM   Specimen: CSF; Cerebrospinal Fluid  Result Value Ref Range Status   Specimen Description   Final    CSF Performed at [facility], [address] [place], [place] [address]    Special Requests   Final    NONE Performed  at [facility], [address], [place], [place] [address]    Culture   Final    NO FUNGUS ISOLATED AFTER 19 DAYS Performed at [facility], [place] [address], [place], [place] [address]    Report Status PENDING  Incomplete  CSF culture w Gram Stain     Status: None   Collection Time: 11/20/20 11:09 AM   Specimen: PATH Cytology CSF; Cerebrospinal Fluid  Result Value Ref Range Status   Specimen Description   Final    CSF Performed at [facility], [address], [place], [place] [address]    Special Requests   Final    NONE Performed at [facility], [address] [place], [place] [address]    Gram Stain   Final    NO ORGANISMS SEEN WBC SEEN RED BLOOD CELLS PRESENT Performed at [facility], [address] [place], [place] [address]    Culture   Final    NO GROWTH 3 DAYS Performed at [facility], [place] [address] [place], [place] [address]    Report Status 11/23/2020 FINAL  Final  Fungus Culture With Stain     Status: None (Preliminary result)   Collection Time: 11/20/20 11:09 AM   Specimen: PATH Cytology CSF; Cerebrospinal Fluid  Result Value Ref Range Status   Fungus Stain Final report  Final    Comment: (NOTE) Performed At: [facility] [place], [place] [id number] Rush Farmer MD [id number]    Fungus (Mycology) Culture PENDING  Incomplete   Fungal Source CSF  Final    Comment: Performed at [facility], [address] [place],  [address]  Fungus Culture Result     Status: None   Collection Time: 11/20/20 11:09 AM  Result Value Ref Range Status   Result 1 Comment  Final    Comment: (NOTE) KOH/Calcofluor preparation:  no fungus observed. Performed At: [facility] [place], [place] [id number] Rush Farmer MD [id number]   Resp Panel by RT-PCR (Flu A&B, Covid) Nasopharyngeal Swab     Status: None   Collection Time: 11/27/20  1:39 PM    Specimen: Nasopharyngeal Swab; Nasopharyngeal(NP) swabs in vial transport medium  Result Value Ref Range Status   SARS Coronavirus 2 by RT PCR NEGATIVE NEGATIVE Final    Comment: (NOTE) SARS-CoV-2 target nucleic acids are NOT DETECTED.  The SARS-CoV-2 RNA is generally detectable in upper respiratory specimens during the acute phase of infection.  The lowest concentration of SARS-CoV-2 viral copies this assay can detect is 138 copies/mL. A negative result does not preclude SARS-Cov-2 infection and should not be used as the sole basis for treatment or other patient management decisions. A negative result may occur with  improper specimen collection/handling, submission of specimen other than nasopharyngeal swab, presence of viral mutation(s) within the areas targeted by this assay, and inadequate number of viral copies(<138 copies/mL). A negative result must be combined with clinical observations, patient history, and epidemiological information. The expected result is Negative.  Fact Sheet for Patients:  EntrepreneurPulse.com.au  Fact Sheet for Healthcare Providers:  IncredibleEmployment.be  This test is no t yet approved or cleared by the [place] FDA and  has been authorized for detection and/or diagnosis of SARS-CoV-2 by FDA under an Emergency Use Authorization (EUA). This EUA will remain  in effect (meaning this test can be used) for the duration of the COVID-19 declaration under Section 564(b)(1) of the Act, 21 U.S.C.section 360bbb-3(b)(1), unless the authorization is terminated  or revoked sooner.       Influenza A by PCR NEGATIVE NEGATIVE Final   Influenza B by PCR NEGATIVE NEGATIVE Final    Comment: (NOTE) The Xpert Xpress SARS-CoV-2/FLU/RSV plus assay is intended as an aid in the diagnosis of influenza from Nasopharyngeal swab specimens and should not be used as a sole basis for treatment. Nasal washings and aspirates are  unacceptable for Xpert Xpress SARS-CoV-2/FLU/RSV testing.  Fact Sheet for Patients: EntrepreneurPulse.com.au  Fact Sheet for Healthcare Providers: IncredibleEmployment.be  This test is not yet approved or cleared by the [place] FDA and has been authorized for detection and/or diagnosis of SARS-CoV-2 by FDA under an Emergency Use Authorization (EUA). This EUA will remain in effect (meaning this test can be used) for the duration of the COVID-19 declaration under Section 564(b)(1) of the Act, 21 U.S.C. section 360bbb-3(b)(1), unless the authorization is terminated or revoked.  Performed at [facility], [place] [place], [place] [address]   Urine Culture     Status: Abnormal   Collection Time: 11/27/20  2:15 PM   Specimen: Urine, Clean Catch  Result Value Ref Range Status   Specimen Description   Final    URINE, CLEAN CATCH Performed at [facility], [address] [place], [place] [address]    Special Requests   Final    NONE Performed at [facility], [place], [place] [address]    Culture 20,000 COLONIES/mL ENTEROBACTER AEROGENES (A)  Final   Report Status 11/30/2020 FINAL  Final   Organism ID, Bacteria ENTEROBACTER AEROGENES (A)  Final      Susceptibility   Enterobacter aerogenes - MIC*    CEFAZOLIN >=64 RESISTANT Resistant     CEFEPIME 1 SENSITIVE Sensitive     CEFTRIAXONE >=64 RESISTANT Resistant     CIPROFLOXACIN <=0.25 SENSITIVE Sensitive     GENTAMICIN <=1 SENSITIVE Sensitive     IMIPENEM <=0.25 SENSITIVE Sensitive     NITROFURANTOIN 64 INTERMEDIATE Intermediate     TRIMETH/SULFA <=20 SENSITIVE Sensitive     PIP/TAZO >=128 RESISTANT Resistant     * 20,000 COLONIES/mL ENTEROBACTER AEROGENES  CULTURE, BLOOD (ROUTINE X 2) w Reflex to ID Panel     Status: None   Collection Time: 11/27/20  8:23 PM   Specimen: BLOOD  Result Value Ref Range Status   Specimen Description  BLOOD LAC  Final   Special Requests   Final    BOTTLES DRAWN AEROBIC  AND ANAEROBIC Blood Culture results may not be optimal due to an inadequate volume of blood received in culture bottles   Culture   Final    NO GROWTH 5 DAYS Performed at [facility], [address] [place], [place] [address]    Report Status 12/02/2020 FINAL  Final  CULTURE, BLOOD (ROUTINE X 2) w Reflex to ID Panel     Status: None   Collection Time: 11/27/20  9:32 PM   Specimen: BLOOD LEFT HAND  Result Value Ref Range Status   Specimen Description BLOOD LEFT HAND  Final   Special Requests   Final    BOTTLES DRAWN AEROBIC AND ANAEROBIC Blood Culture adequate volume   Culture   Final    NO GROWTH 5 DAYS Performed at [facility], [address] [place], [place] [address]    Report Status 12/02/2020 FINAL  Final  CULTURE, BLOOD (ROUTINE X 2) w Reflex to ID Panel     Status: None (Preliminary result)   Collection Time: 12/06/20 10:35 AM   Specimen: BLOOD  Result Value Ref Range Status   Specimen Description BLOOD LT KNUCKLE  Final   Special Requests   Final    BOTTLES DRAWN AEROBIC AND ANAEROBIC Blood Culture adequate volume   Culture   Final    NO GROWTH 4 DAYS Performed at [facility], [address], [place], [place] [address]    Report Status PENDING  Incomplete  CULTURE, BLOOD (ROUTINE X 2) w Reflex to ID Panel     Status: None (Preliminary result)   Collection Time: 12/06/20 10:35 AM   Specimen: BLOOD  Result Value Ref Range Status   Specimen Description BLOOD BLOOD LEFT WRIST  Final   Special Requests AEROBIC BOTTLE ONLY Blood Culture adequate volume  Final   Culture   Final    NO GROWTH 4 DAYS Performed at [facility], [address], [place], [place] [address]    Report Status PENDING  Incomplete    Coagulation Studies: Recent Labs    12/09/20 1020  LABPROT 14.1  INR 1.1    Urinalysis: No results for input(s): COLORURINE, LABSPEC, PHURINE,  GLUCOSEU, HGBUR, BILIRUBINUR, KETONESUR, PROTEINUR, UROBILINOGEN, NITRITE, LEUKOCYTESUR in the last 72 hours.  Invalid input(s): APPERANCEUR     Imaging: No results found.   Medications:      sodium chloride   Intravenous Once   amLODipine  5 mg Oral Daily   Chlorhexidine Gluconate Cloth  6 each Topical Daily   diltiazem  120 mg Oral Daily   feeding supplement  1 Container Oral TID BM   insulin aspart  0-15 Units Subcutaneous TID WC   insulin aspart  9 Units Subcutaneous TID WC   insulin glargine-yfgn  18 Units Subcutaneous BID   levothyroxine  100 mcg Oral QAC breakfast   magnesium oxide  800 mg Oral BID   mirtazapine  15 mg Oral QHS   multivitamin with minerals  1 tablet Oral Daily   pantoprazole  40 mg Oral Daily   psyllium  1 packet Oral Daily   rosuvastatin  10 mg Oral QHS   sodium bicarbonate  650 mg Oral TID   sodium chloride flush  10-40 mL Intracatheter Q12H   thiamine  100 mg Oral Daily   acetaminophen **OR** acetaminophen, albuterol, haloperidol lactate, loperamide, ondansetron **OR** ondansetron (ZOFRAN) IV, polyethylene glycol, sodium chloride flush  Assessment/ Plan:  Ms. Murdis Flitton is a 55 y.o.  female with medical conditions including pneumococcal  meningitis bacteremia, hypothyroidism, Type 2 diabetes, and lymphoma in remission. She presents to the ED with confusion and is admitted for Altered mental status [R41.82] Encephalopathy acute [G93.40] Altered mental status, unspecified altered mental status type [R41.82] Sepsis with encephalopathy without septic shock, due to unspecified organism ([place]) [A41.9, R65.20, G93.40]   Acute Kidney Injury/hematuria/ proteinuria with baseline creatinine 0.49 on 11/22/20.  Acute kidney injury secondary to contrast induced ATN. IV contrast exposure on November 21, 2020.  UA shows protienuria, >50 RBCs, 21-50 WBC, microbiology 20k colonies- Enterobacter. Renal ultrasound negative for mass and obstruction. Questionable  small urterocele. No acute need for dialysis at this time Complements decreased and ANCA positive. Due to conflicting results, will retest for ANCA and other labs to verify results -Renal function continues to decline. Radiologist unable to do biopsy due to increased risk for bleeding. Plans to transfuse platelets and reassess in the am. Discussed with patient the risk involved with performing this renal biopsy with low platelets.Also discussed that the results gained will provide a more complete picture of damage and required care. Patient will discuss these options with her husband.  - Also discussed the need for dialysis with kidney function declining. Explained to patient and family that this will clean the blood and manage fluid volume until we have a confirmed diagnosis and treatment plan. This is believed to be a temporary need, but will depend on what is found during the biopsy. -Contacted vascular about placing a HD temp cath due to thrombocytopenia. Once placed, we will perform dialysis  - Will continue to monitor closely  Lab Results  Component Value Date   CREATININE 4.42 (H) 12/10/2020   CREATININE 4.10 (H) 12/09/2020   CREATININE 3.72 (H) 12/08/2020    Intake/Output Summary (Last 24 hours) at 12/10/2020 1033 Last data filed at 12/10/2020 0449 Gross per 24 hour  Intake 0 ml  Output 650 ml  Net -650 ml    2. Anemia  with history of CLL pancytopenia Lab Results  Component Value Date   HGB 7.4 (L) 12/10/2020   Bone marrow biopsy 12/04/20, limited marrow material with trilineage hematopoiesis. Repeat biopsy recommended.  Oncology following    LOS: [place] 8/17/202210:33 AM

## 2020-12-10 NOTE — Consult Note (Signed)
[facility] Consult Note  MRN : [id number]  Tonya Myers is a 55 y.o. ([birth year]) female who presents with chief complaint of  Chief Complaint  Patient presents with   Altered Mental Status  .  History of Present Illness:   The patient is seen for evaluation for dialysis access in the acute setting. The patient was admitted in late July with worsening infectious complications..  She presented with altered mental status.  Earlier this year she had been treated for bacterial meningitis with a epidural abscess.  At the time of her admission her renal function was within the normal range but during this hospitalization her renal function has deteriorated steadily.  On August 8 nephrology was consulted at which time her creatinine was 1.8.  Today her creatinine is risen to 4.4 she is exhibiting more uremic symptoms and nephrology is request urgent access so that they can start dialysis.  The patient denies history of DVT, PE or superficial thrombophlebitis. The patient denies recent episodes of angina or shortness of breath.    Current Facility-Administered Medications  Medication Dose Route Frequency Provider Last Rate Last Admin   acetaminophen (TYLENOL) tablet 650 mg  650 mg Oral Q6H PRN Cox, Amy N, DO   650 mg at 12/05/20 1202   Or   acetaminophen (TYLENOL) suppository 650 mg  650 mg Rectal Q6H PRN Cox, Amy N, DO       albuterol (PROVENTIL) (2.5 MG/3ML) 0.083% nebulizer solution 3 mL  3 mL Inhalation Q6H PRN Cox, Amy N, DO       amLODipine (NORVASC) tablet 5 mg  5 mg Oral Daily Sreenath, Sudheer B, MD   5 mg at 12/10/20 1045   Chlorhexidine Gluconate Cloth 2 % PADS 6 each  6 each Topical Daily Enzo Bi, MD   6 each at 12/10/20 1050   diltiazem (CARDIZEM CD) 24 hr capsule 120 mg  120 mg Oral Daily Cox, Amy N, DO   120 mg at 12/10/20 1046   feeding supplement (BOOST / RESOURCE BREEZE) liquid 1 Container  1 Container Oral TID BM Enzo Bi, MD   1 Container  at 12/10/20 1046   haloperidol lactate (HALDOL) injection 2 mg  2 mg Intravenous Q15 min PRN Enzo Bi, MD       insulin aspart (novoLOG) injection 0-15 Units  0-15 Units Subcutaneous TID WC Enzo Bi, MD   2 Units at 12/10/20 1334   levothyroxine (SYNTHROID) tablet 100 mcg  100 mcg Oral QAC breakfast Ralene Muskrat B, MD   100 mcg at 12/09/20 0517   loperamide (IMODIUM) capsule 2 mg  2 mg Oral PRN Ralene Muskrat B, MD   2 mg at 12/05/20 0949   magnesium oxide (MAG-OX) tablet 800 mg  800 mg Oral BID Oswald Hillock, RPH   800 mg at 12/10/20 1045   mirtazapine (REMERON) tablet 15 mg  15 mg Oral QHS Ralene Muskrat B, MD   15 mg at 12/09/20 2100   multivitamin with minerals tablet 1 tablet  1 tablet Oral Daily Ralene Muskrat B, MD   1 tablet at 12/10/20 1045   ondansetron (ZOFRAN) tablet 4 mg  4 mg Oral Q6H PRN Cox, Amy N, DO       Or   ondansetron (ZOFRAN) injection 4 mg  4 mg Intravenous Q6H PRN Cox, Amy N, DO   4 mg at 11/23/20 0751   pantoprazole (PROTONIX) EC tablet 40 mg  40 mg Oral Daily Cox, Amy N,  DO   40 mg at 12/10/20 1045   polyethylene glycol (MIRALAX / GLYCOLAX) packet 34 g  34 g Oral BID PRN Enzo Bi, MD   34 g at 12/09/20 1321   psyllium (HYDROCIL/METAMUCIL) 1 packet  1 packet Oral Daily Ralene Muskrat B, MD   1 packet at 12/10/20 1042   rosuvastatin (CRESTOR) tablet 10 mg  10 mg Oral QHS Cox, Amy N, DO   10 mg at 12/09/20 2100   sodium bicarbonate tablet 650 mg  650 mg Oral TID Liana Gerold, MD   650 mg at 12/10/20 1045   sodium chloride flush (NS) 0.9 % injection 10-40 mL  10-40 mL Intracatheter Q12H Enzo Bi, MD   10 mL at 12/10/20 1044   sodium chloride flush (NS) 0.9 % injection 10-40 mL  10-40 mL Intracatheter PRN Enzo Bi, MD   10 mL at 12/04/20 2128   thiamine tablet 100 mg  100 mg Oral Daily Oswald Hillock, RPH   100 mg at 12/10/20 1045    Past Medical History:  Diagnosis Date   Acute hemorrhoid 03/11/2015   Anxiety    Arthritis    Asthma     Cancer ([place])    Chronic back pain    CLL (chronic lymphocytic leukemia) ([place])    Depression    Diabetes mellitus without complication ([place])    Genital herpes    type 2   Hypertension    Hypothyroidism    Vertigo     Past Surgical History:  Procedure Laterality Date   ABDOMINAL HYSTERECTOMY     BILATERAL SALPINGOOPHORECTOMY  2009   BREAST BIOPSY Right 05/17/2016   FIBROADENOMATOUS CHANGE AND SCLEROSING ADENOSIS WITH   COLONOSCOPY WITH PROPOFOL N/A 06/16/2015   Procedure: COLONOSCOPY WITH PROPOFOL;  Surgeon: [name], MD;  Location: [facility] ENDOSCOPY;  Service: Endoscopy;  Laterality: N/A;   LAPAROSCOPIC SUPRACERVICAL HYSTERECTOMY  2009   due to [place] Bilateral 06/17/2020   Procedure: THORACIC LAMINECTOMY FOR EPIDURAL ABSCESS;  Surgeon: Deetta Perla, MD;  Location: [facility] ORS;  Service: Neurosurgery;  Laterality: Bilateral;    Social History Social History   Tobacco Use   Smoking status: Never   Smokeless tobacco: Never  Vaping Use   Vaping Use: Never used  Substance Use Topics   Alcohol use: Yes    Alcohol/week: 1.0 standard drink    Types: 1 Cans of beer per week   Drug use: No    Family History Family History  Problem Relation Age of Onset   Cancer Paternal Aunt    Breast cancer Maternal Aunt 60    Allergies  Allergen Reactions   Amoxapine    Penicillins      REVIEW OF SYSTEMS (Negative unless checked)  Constitutional: [] Weight loss  [] Fever  [] Chills Cardiac: [] Chest pain   [] Chest pressure   [] Palpitations   [] Shortness of breath at rest   [] Shortness of breath with exertion. Vascular:  [] Pain in legs with walking   [] Pain in legs at rest  [] History of DVT   [] Phlebitis   [] Swelling in legs   [] Varicose veins   [] Non-healing ulcers Pulmonary:   [] Uses home oxygen   [] Productive cough   [] Hemoptysis   [] Wheeze  [] COPD   [] Asthma Neurologic:  [] Dizziness  [] Blackouts   [] Seizures   [] History  of stroke   [] History of TIA  [] Aphasia   [] Temporary blindness   [] Dysphagia   [] Weakness or  numbness in arms   [] Weakness or numbness in legs Musculoskeletal:  [] Arthritis   [] Joint swelling   [] Joint pain   [] Low back pain Hematologic:  [] Easy bruising    [] Hypercoagulable state   [] Anemic  [] Hepatitis Gastrointestinal:  [] Blood in stool   [] Vomiting blood  [] Gastroesophageal reflux/heartburn   [] Difficulty swallowing. Genitourinary:  [x] Chronic kidney disease   [] Difficult urination  [] Frequent urination  [] Burning with urination   [] Blood in urine Skin:  [] Rashes   [] Ulcers   Psychological:  [] History of anxiety   []  History of major depression.    Physical Examination  Vitals:   12/10/20 1556 12/10/20 1615 12/10/20 1628 12/10/20 1630  BP: (!) 147/84 (!) 146/78 (!) 150/82 (!) 151/80  Pulse: (!) 55     Resp: 16 18 15 18   Temp: 99.4 F (37.4 C)     TempSrc: Oral     SpO2: 100%  100% 100%  Weight:      Height:       Body mass index is 28.23 kg/m.  Head: [place]/AT, No temporalis wasting.  Ear/Nose/Throat: Nares w/o erythema or drainage, oropharynx mucus membranes moist Eyes: PERRLA, Sclera nonicteric.  Neck: Supple,   No JVD.  Pulmonary:  Breath sounds equal bilaterally, no use of accessory muscles.  Cardiac: RRR, normal S1, S2,  Vascular: 2+ radial and femoral pulses bilaterally Gastrointestinal: soft, non-tender, non-distended.  Musculoskeletal: Moves all extremities.  No deformity or atrophy. No edema. Neurologic: 5/5 motor sensory, face appears symmetric speech fluent  Psychiatric: Appropriate mood for situation. Dermatologic: No rashes or ulcers noted.  No cellulitis or open wounds. Lymph : No Cervical,  or Inguinal lymphadenopathy.      CBC Lab Results  Component Value Date   WBC 8.0 12/10/2020   HGB 7.4 (L) 12/10/2020   HCT 22.3 (L) 12/10/2020   MCV 80.8 12/10/2020   PLT 21 (LL) 12/10/2020    BMET    Component Value Date/Time   NA 137 12/10/2020 0508   K  4.7 12/10/2020 0508   CL 109 12/10/2020 0508   CO2 21 (L) 12/10/2020 0508   GLUCOSE 132 (H) 12/10/2020 0508   BUN 75 (H) 12/10/2020 0508   CREATININE 4.42 (H) 12/10/2020 0508   CALCIUM 8.3 (L) 12/10/2020 0508   GFRNONAA 11 (L) 12/10/2020 0508   GFRAA >60 01/17/2020 1053   Estimated Creatinine Clearance: 18.1 mL/min (A) (by C-G formula based on SCr of 4.42 mg/dL (H)).  COAG Lab Results  Component Value Date   INR 1.1 12/09/2020   INR 1.0 11/20/2020   INR 1.3 (H) 06/10/2020      Assessment/Plan 1.  Acute renal dysfunction with increasing uremic symptoms now requiring hemodialysis: Patient should undergo placement of a temporary catheter so that dialysis can be initiated immediately.  Risks and benefits of been reviewed all questions been answered patient has agreed to proceed   Hortencia Pilar, MD  12/10/2020 4:53 PM

## 2020-12-10 NOTE — Progress Notes (Signed)
OT Cancellation Note  Patient Details Name: Tonya Myers MRN: [id number] DOB: 10-21-1965   Cancelled Treatment:    Reason Eval/Treat Not Completed: Patient declined, no reason specified. Attempted to see pt this AM before biopsy and HD, however pt declining and stating "no not today. I dont want to use what little energy I have before my procedures". OT to re-attempt at later time/date as able.  Fredirick Maudlin, OTR/L [place]

## 2020-12-10 NOTE — H&P (View-Only) (Signed)
[facility] [facility] Consult Note  MRN : [id number]  Tonya Myers is a 55 y.o. (05/20/1965) female who presents with chief complaint of  Chief Complaint  Patient presents with   Altered Mental Status  .  History of Present Illness:   The patient is seen for evaluation for dialysis access in the acute setting. The patient was admitted in late July with worsening infectious complications..  She presented with altered mental status.  Earlier this year she had been treated for bacterial meningitis with a epidural abscess.  At the time of her admission her renal function was within the normal range but during this hospitalization her renal function has deteriorated steadily.  On August 8 nephrology was consulted at which time her creatinine was 1.8.  Today her creatinine is risen to 4.4 she is exhibiting more uremic symptoms and nephrology is request urgent access so that they can start dialysis.  The patient denies history of DVT, PE or superficial thrombophlebitis. The patient denies recent episodes of angina or shortness of breath.    Current Facility-Administered Medications  Medication Dose Route Frequency Provider Last Rate Last Admin   acetaminophen (TYLENOL) tablet 650 mg  650 mg Oral Q6H PRN Cox, Amy N, DO   650 mg at 12/05/20 1202   Or   acetaminophen (TYLENOL) suppository 650 mg  650 mg Rectal Q6H PRN Cox, Amy N, DO       albuterol (PROVENTIL) (2.5 MG/3ML) 0.083% nebulizer solution 3 mL  3 mL Inhalation Q6H PRN Cox, Amy N, DO       amLODipine (NORVASC) tablet 5 mg  5 mg Oral Daily Sreenath, Sudheer B, MD   5 mg at 12/10/20 1045   Chlorhexidine Gluconate Cloth 2 % PADS 6 each  6 each Topical Daily Enzo Bi, MD   6 each at 12/10/20 1050   diltiazem (CARDIZEM CD) 24 hr capsule 120 mg  120 mg Oral Daily Cox, Amy N, DO   120 mg at 12/10/20 1046   feeding supplement (BOOST / RESOURCE BREEZE) liquid 1 Container  1 Container Oral TID BM Enzo Bi, MD   1 Container  at 12/10/20 1046   haloperidol lactate (HALDOL) injection 2 mg  2 mg Intravenous Q15 min PRN Enzo Bi, MD       insulin aspart (novoLOG) injection 0-15 Units  0-15 Units Subcutaneous TID WC Enzo Bi, MD   2 Units at 12/10/20 1334   levothyroxine (SYNTHROID) tablet 100 mcg  100 mcg Oral QAC breakfast Ralene Muskrat B, MD   100 mcg at 12/09/20 0517   loperamide (IMODIUM) capsule 2 mg  2 mg Oral PRN Ralene Muskrat B, MD   2 mg at 12/05/20 0949   magnesium oxide (MAG-OX) tablet 800 mg  800 mg Oral BID Oswald Hillock, RPH   800 mg at 12/10/20 1045   mirtazapine (REMERON) tablet 15 mg  15 mg Oral QHS Ralene Muskrat B, MD   15 mg at 12/09/20 2100   multivitamin with minerals tablet 1 tablet  1 tablet Oral Daily Ralene Muskrat B, MD   1 tablet at 12/10/20 1045   ondansetron (ZOFRAN) tablet 4 mg  4 mg Oral Q6H PRN Cox, Amy N, DO       Or   ondansetron (ZOFRAN) injection 4 mg  4 mg Intravenous Q6H PRN Cox, Amy N, DO   4 mg at 11/23/20 0751   pantoprazole (PROTONIX) EC tablet 40 mg  40 mg Oral Daily Cox, Amy N,  DO   40 mg at 12/10/20 1045   polyethylene glycol (MIRALAX / GLYCOLAX) packet 34 g  34 g Oral BID PRN Enzo Bi, MD   34 g at 12/09/20 1321   psyllium (HYDROCIL/METAMUCIL) 1 packet  1 packet Oral Daily Ralene Muskrat B, MD   1 packet at 12/10/20 1042   rosuvastatin (CRESTOR) tablet 10 mg  10 mg Oral QHS Cox, Amy N, DO   10 mg at 12/09/20 2100   sodium bicarbonate tablet 650 mg  650 mg Oral TID Liana Gerold, MD   650 mg at 12/10/20 1045   sodium chloride flush (NS) 0.9 % injection 10-40 mL  10-40 mL Intracatheter Q12H Enzo Bi, MD   10 mL at 12/10/20 1044   sodium chloride flush (NS) 0.9 % injection 10-40 mL  10-40 mL Intracatheter PRN Enzo Bi, MD   10 mL at 12/04/20 2128   thiamine tablet 100 mg  100 mg Oral Daily Oswald Hillock, RPH   100 mg at 12/10/20 1045    Past Medical History:  Diagnosis Date   Acute hemorrhoid 03/11/2015   Anxiety    Arthritis    Asthma     Cancer ([place])    Chronic back pain    CLL (chronic lymphocytic leukemia) ([place])    Depression    Diabetes mellitus without complication ([place])    Genital herpes    type 2   Hypertension    Hypothyroidism    Vertigo     Past Surgical History:  Procedure Laterality Date   ABDOMINAL HYSTERECTOMY     BILATERAL SALPINGOOPHORECTOMY  2009   BREAST BIOPSY Right 05/17/2016   FIBROADENOMATOUS CHANGE AND SCLEROSING ADENOSIS WITH   COLONOSCOPY WITH PROPOFOL N/A 06/16/2015   Procedure: COLONOSCOPY WITH PROPOFOL;  Surgeon: [name], MD;  Location: [facility] ENDOSCOPY;  Service: Endoscopy;  Laterality: N/A;   LAPAROSCOPIC SUPRACERVICAL HYSTERECTOMY  2009   due to [place] Bilateral 06/17/2020   Procedure: THORACIC LAMINECTOMY FOR EPIDURAL ABSCESS;  Surgeon: Deetta Perla, MD;  Location: [facility] ORS;  Service: Neurosurgery;  Laterality: Bilateral;    Social History Social History   Tobacco Use   Smoking status: Never   Smokeless tobacco: Never  Vaping Use   Vaping Use: Never used  Substance Use Topics   Alcohol use: Yes    Alcohol/week: 1.0 standard drink    Types: 1 Cans of beer per week   Drug use: No    Family History Family History  Problem Relation Age of Onset   Cancer Paternal Aunt    Breast cancer Maternal Aunt 50    Allergies  Allergen Reactions   Amoxapine    Penicillins      REVIEW OF SYSTEMS (Negative unless checked)  Constitutional: [] Weight loss  [] Fever  [] Chills Cardiac: [] Chest pain   [] Chest pressure   [] Palpitations   [] Shortness of breath at rest   [] Shortness of breath with exertion. Vascular:  [] Pain in legs with walking   [] Pain in legs at rest  [] History of DVT   [] Phlebitis   [] Swelling in legs   [] Varicose veins   [] Non-healing ulcers Pulmonary:   [] Uses home oxygen   [] Productive cough   [] Hemoptysis   [] Wheeze  [] COPD   [] Asthma Neurologic:  [] Dizziness  [] Blackouts   [] Seizures   [] History  of stroke   [] History of TIA  [] Aphasia   [] Temporary blindness   [] Dysphagia   [] Weakness or  numbness in arms   [] Weakness or numbness in legs Musculoskeletal:  [] Arthritis   [] Joint swelling   [] Joint pain   [] Low back pain Hematologic:  [] Easy bruising    [] Hypercoagulable state   [] Anemic  [] Hepatitis Gastrointestinal:  [] Blood in stool   [] Vomiting blood  [] Gastroesophageal reflux/heartburn   [] Difficulty swallowing. Genitourinary:  [x] Chronic kidney disease   [] Difficult urination  [] Frequent urination  [] Burning with urination   [] Blood in urine Skin:  [] Rashes   [] Ulcers   Psychological:  [] History of anxiety   []  History of major depression.    Physical Examination  Vitals:   12/10/20 1556 12/10/20 1615 12/10/20 1628 12/10/20 1630  BP: (!) 147/84 (!) 146/78 (!) 150/82 (!) 151/80  Pulse: (!) 55     Resp: 16 18 15 18   Temp: 99.4 F (37.4 C)     TempSrc: Oral     SpO2: 100%  100% 100%  Weight:      Height:       Body mass index is 28.23 kg/m.  Head: [place]/AT, No temporalis wasting.  Ear/Nose/Throat: Nares w/o erythema or drainage, oropharynx mucus membranes moist Eyes: PERRLA, Sclera nonicteric.  Neck: Supple,   No JVD.  Pulmonary:  Breath sounds equal bilaterally, no use of accessory muscles.  Cardiac: RRR, normal S1, S2,  Vascular: 2+ radial and femoral pulses bilaterally Gastrointestinal: soft, non-tender, non-distended.  Musculoskeletal: Moves all extremities.  No deformity or atrophy. No edema. Neurologic: 5/5 motor sensory, face appears symmetric speech fluent  Psychiatric: Appropriate mood for situation. Dermatologic: No rashes or ulcers noted.  No cellulitis or open wounds. Lymph : No Cervical,  or Inguinal lymphadenopathy.      CBC Lab Results  Component Value Date   WBC 8.0 12/10/2020   HGB 7.4 (L) 12/10/2020   HCT 22.3 (L) 12/10/2020   MCV 80.8 12/10/2020   PLT 21 (LL) 12/10/2020    BMET    Component Value Date/Time   NA 137 12/10/2020 0508   K  4.7 12/10/2020 0508   CL 109 12/10/2020 0508   CO2 21 (L) 12/10/2020 0508   GLUCOSE 132 (H) 12/10/2020 0508   BUN 75 (H) 12/10/2020 0508   CREATININE 4.42 (H) 12/10/2020 0508   CALCIUM 8.3 (L) 12/10/2020 0508   GFRNONAA 11 (L) 12/10/2020 0508   GFRAA >60 01/17/2020 1053   Estimated Creatinine Clearance: 18.1 mL/min (A) (by C-G formula based on SCr of 4.42 mg/dL (H)).  COAG Lab Results  Component Value Date   INR 1.1 12/09/2020   INR 1.0 11/20/2020   INR 1.3 (H) 06/10/2020      Assessment/Plan 1.  Acute renal dysfunction with increasing uremic symptoms now requiring hemodialysis: Patient should undergo placement of a temporary catheter so that dialysis can be initiated immediately.  Risks and benefits of been reviewed all questions been answered patient has agreed to proceed   Hortencia Pilar, MD  12/10/2020 4:53 PM

## 2020-12-10 NOTE — Progress Notes (Signed)
Progress Note    Tonya Myers   DPO:[id number]  DOB: [birth year]  DOA: 11/19/2020     20  PCP: [name], MD  Initial CC: AMS  Hospital Course: Tonya Myers is a 55 y.o. female with medical history significant for recent pneumococcal meningitis and bacteremia, depression, hypothyroid, IDDM2 who presented to the ED with AMS.  She underwent extensive work-up after admission with multiple subspecialty consults. Infectious work-up with ID is negative and ID has signed off on 12/09/2020 (see FUO workup below).  She also underwent further work-up with nephrology, oncology, and rheumatology. She has multiple serologies concerning for possible lupus and trying to now obtain renal biopsy to help further diagnose.  Thrombocytopenia has been preventing biopsy obtainment. Due to worsening renal function, nephrology also considering possible initiation of dialysis.  Interval History:  No events overnight.  Husband present bedside this morning.  This was my first time meeting the patient.  Reviewed some of her work-up done thus far and the overall plan for at least today.  Due to worsening thrombocytopenia, she will not undergo renal biopsy at this time.  Her diet was restarted. I briefly discussed with IR as well as nephrology this morning. She may still need consideration for dialysis which is being coordinated with nephrology. She will receive 2 units of platelets today in efforts to obtain renal biopsy soon. She also had a family member on the phone which was also updated regarding the plan for today.  ROS: Constitutional: positive for fatigue and malaise, negative for chills and fevers, Respiratory: negative for cough and sputum, Cardiovascular: negative for chest pain, and Gastrointestinal: negative for abdominal pain  Assessment & Plan:  Positive ANA Positive autoimmune serologies Serologic work-up indicative of underlying autoimmune process; concern is Lupus - s/p 3 days  steroids - oncology, nephrology, and rheumatology following - tentative plan is renal biopsy as PLTC allows (see thrombocytopenia)  AKI - baseline creat ~0.7 and renal function has continued to deteriorate - nephrology following as well, greatly appreciate assistance - follow up possible vascular consult for trialysis cath placement for now   Thrombocytopenia - also see pancytopenia below - PLTC down to 21k on 8/17; no overt bleeding but in efforts to facilitate renal bx will transfuse 2 packs platelets  - repeat CBC after transfusion and in am  DMII - A1c 6.5% on 10/02/20 - s/p steroids and sounds like CBGs elevated at that time  - remains on SSI as well as prandial coverage and basal insulin - she is at risk for hypoglycemia with that regimen (as has already happened) - now that steroids are complete, will decrease her insulin regimen  - d/c prandial and Semglee - use SSI only for now  FUO, recurrent - resolved  Hx of epidural and paraspinal abscess - fevers seem to have defervesced since 8/12 - ID has followed and signed off on 8/16 (see their note as well) - no further workup at this time    Acute pancreatitis - resolved  Unclear etiology No reported history of alcohol intake, no radiographic evidence of gallstones Possible drug-induced Initial abdominal examination negative for tenderness Lipase trending down with IVF, but much slowly than usual. Triglycerides negative - now resolved   Acute toxic metabolic encephalopathy, resolved Unclear etiology --meningitis ruled out by LP.  CT head neg for new stroke.  Hematology ruled out CLL as the culprit.  No seizure on EEG. --mental status dramatically improved with treatment of pancreatitis and IV thiamine repletion. 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 intake is driven by appetite rather than pain --s/p Diflucan for 3 doses   Hyperlipidemia --cont statin   Hypothyroid --cont Synthroid   HTN --cont cardizem and hydralazine    Old records reviewed in assessment of this patient  Antimicrobials:   DVT prophylaxis: Place TED hose Start: 11/19/20 2301   Code Status:   Code Status: Full Code Family Communication: Husband  Disposition Plan: Status is: Inpatient  Remains inpatient appropriate because:Inpatient level of care appropriate due to severity of illness  Dispo: The patient is from: Home              Anticipated d/c is to: Home              Patient currently is not medically stable to d/c.   Difficult to place patient No      Risk of unplanned readmission score: Unplanned Admission- Pilot do not use: 46.54   Objective: Blood pressure (!) 151/77, pulse 69, temperature 98.6 F (37 C), temperature source Oral, resp. rate 19, height 6' (1.829 m), weight 94.4 kg, SpO2 99 %.  Examination: General appearance: alert, cooperative, and no distress Head:  face appears slightly oversized Mouth: no thrush noted; Torus Palatinus noted  Eyes:  EOMI Lungs: clear to auscultation bilaterally Heart: regular rate and rhythm and S1, S2 normal Abdomen: normal findings: bowel sounds normal and soft, non-tender Extremities:  no LE edema; swollen joints noted in ankle, wrist; some edema in fingers Skin:  excess skin noted due to weight loss Neurologic: no focal deficits   Consultants:  Oncology/hematology Rheumatology Nephrology ID  Procedures:    Data Reviewed: I  have personally reviewed following labs and imaging studies Results for orders placed or performed during the hospital encounter of 11/19/20 (from the past 24 hour(s))  Hepatitis B core antibody, total     Status: None   Collection Time: 12/09/20  3:50 PM  Result Value Ref Range   Hep B Core Total Ab NON REACTIVE NON REACTIVE  Hepatitis B surface antigen     Status: None   Collection Time: 12/09/20  3:50 PM  Result Value Ref Range   Hepatitis B Surface Ag NON REACTIVE NON REACTIVE  Hepatitis B surface antibody,quantitative     Status: Abnormal   Collection Time: 12/09/20  3:50 PM  Result Value Ref Range   Hepatitis B-Post <3.1 (L) Immunity>9.9 mIU/mL  Glucose, capillary     Status: Abnormal   Collection Time: 12/09/20  4:31 PM  Result Value Ref Range   Glucose-Capillary 248 (H) 70 - 99 mg/dL  Glucose, capillary     Status: Abnormal   Collection Time: 12/09/20  7:36 PM  Result Value Ref Range   Glucose-Capillary 272 (H) 70 - 99 mg/dL  Basic metabolic panel     Status: Abnormal   Collection Time: 12/10/20  5:08 AM  Result Value Ref Range   Sodium 137 135 - 145 mmol/L   Potassium 4.7 3.5 - 5.1 mmol/L  Chloride 109 98 - 111 mmol/L   CO2 21 (L) 22 - 32 mmol/L   Glucose, Bld 132 (H) 70 - 99 mg/dL   BUN 75 (H) 6 - 20 mg/dL   Creatinine, Ser 4.42 (H) 0.44 - 1.00 mg/dL   Calcium 8.3 (L) 8.9 - 10.3 mg/dL   GFR, Estimated 11 (L) >60 mL/min   Anion gap 7 5 - 15  CBC with Differential/Platelet     Status: Abnormal   Collection Time: 12/10/20  5:08 AM  Result Value Ref Range   WBC 8.0 4.0 - 10.5 K/uL   RBC 2.76 (L) 3.87 - 5.11 MIL/uL   Hemoglobin 7.4 (L) 12.0 - 15.0 g/dL   HCT 22.3 (L) 36.0 - 46.0 %   MCV 80.8 80.0 - 100.0 fL   MCH 26.8 26.0 - 34.0 pg   MCHC 33.2 30.0 - 36.0 g/dL   RDW 17.1 (H) 11.5 - 15.5 %   Platelets 21 (LL) 150 - 400 K/uL   nRBC 0.0 0.0 - 0.2 %   Neutrophils Relative % 72 %   Neutro Abs 5.7 1.7 - 7.7 K/uL   Lymphocytes Relative 21 %   Lymphs Abs 1.7 0.7 -  4.0 K/uL   Monocytes Relative 5 %   Monocytes Absolute 0.4 0.1 - 1.0 K/uL   Eosinophils Relative 1 %   Eosinophils Absolute 0.1 0.0 - 0.5 K/uL   Basophils Relative 0 %   Basophils Absolute 0.0 0.0 - 0.1 K/uL   Immature Granulocytes 1 %   Abs Immature Granulocytes 0.08 (H) 0.00 - 0.07 K/uL  Glucose, capillary     Status: Abnormal   Collection Time: 12/10/20  7:51 AM  Result Value Ref Range   Glucose-Capillary 107 (H) 70 - 99 mg/dL  Prepare Pheresed Platelets     Status: None (Preliminary result)   Collection Time: 12/10/20 10:29 AM  Result Value Ref Range   Unit Number [id number]    Blood Component Type PLTP2 PSORALEN TREATED    Unit division 00    Status of Unit ISSUED    Transfusion Status      OK TO TRANSFUSE Performed at [facility], [address] [place], [place] [address]    Unit Number [id number]    Blood Component Type PLTP1 PSORALEN TREATED    Unit division 00    Status of Unit ALLOCATED    Transfusion Status OK TO TRANSFUSE   Glucose, capillary     Status: Abnormal   Collection Time: 12/10/20  1:03 PM  Result Value Ref Range   Glucose-Capillary 136 (H) 70 - 99 mg/dL    Recent Results (from the past 240 hour(s))  CULTURE, BLOOD (ROUTINE X 2) w Reflex to ID Panel     Status: None (Preliminary result)   Collection Time: 12/06/20 10:35 AM   Specimen: BLOOD  Result Value Ref Range Status   Specimen Description BLOOD LT KNUCKLE  Final   Special Requests   Final    BOTTLES DRAWN AEROBIC AND ANAEROBIC Blood Culture adequate volume   Culture   Final    NO GROWTH 4 DAYS Performed at [facility], [address] [place], [place] [address]    Report Status PENDING  Incomplete  CULTURE, BLOOD (ROUTINE X 2) w Reflex to ID Panel     Status: None (Preliminary result)   Collection Time: 12/06/20 10:35 AM   Specimen: BLOOD  Result Value Ref Range Status   Specimen Description BLOOD BLOOD LEFT WRIST  Final  Special Requests AEROBIC BOTTLE ONLY  Blood Culture adequate volume  Final   Culture   Final    NO GROWTH 4 DAYS Performed at [facility], [address] [place], [place] [address]    Report Status PENDING  Incomplete     Radiology Studies: PERIPHERAL VASCULAR CATHETERIZATION  Result Date: 12/10/2020 See surgical note for result.  US Venous Img Upper Bilat (DVT)  Final Result    US Venous Img Lower Bilateral (DVT)  Final Result    DG Chest 1 View  Final Result    CT BONE MARROW BIOPSY  Final Result    US RENAL  Final Result    MR Lumbar Spine W Wo Contrast  Final Result    MR THORACIC SPINE W WO CONTRAST  Final Result    MR CERVICAL SPINE W WO CONTRAST  Final Result    MR BRAIN W WO CONTRAST  Final Result    DG Chest Port 1 View  Final Result    [place] EKG SITE RITE  Final Result    CT HEAD W & WO CONTRAST  Final Result    CT CHEST ABDOMEN PELVIS W CONTRAST  Final Result    DG FL GUIDED LUMBAR PUNCTURE  Final Result    CT Head Wo Contrast  Final Result    DG Chest Portable 1 View  Final Result    US BIOPSY (KIDNEY)    (Results Pending)    Scheduled Meds:  [MAR Hold] amLODipine  5 mg Oral Daily   [MAR Hold] Chlorhexidine Gluconate Cloth  6 each Topical Daily   [MAR Hold] diltiazem  120 mg Oral Daily   [MAR Hold] feeding supplement  1 Container Oral TID BM   [MAR Hold] insulin aspart  0-15 Units Subcutaneous TID WC   [MAR Hold] insulin aspart  9 Units Subcutaneous TID WC   [MAR Hold] insulin glargine-yfgn  18 Units Subcutaneous BID   [MAR Hold] levothyroxine  100 mcg Oral QAC breakfast   [MAR Hold] magnesium oxide  800 mg Oral BID   [MAR Hold] mirtazapine  15 mg Oral QHS   [MAR Hold] multivitamin with minerals  1 tablet Oral Daily   [MAR Hold] pantoprazole  40 mg Oral Daily   [MAR Hold] psyllium  1 packet Oral Daily   [MAR Hold] rosuvastatin  10 mg Oral QHS   [MAR Hold] sodium bicarbonate  650 mg Oral TID   [MAR Hold] sodium chloride flush  10-40 mL Intracatheter Q12H    [MAR Hold] thiamine  100 mg Oral Daily   PRN Meds: [MAR Hold] acetaminophen **OR** [MAR Hold] acetaminophen, [MAR Hold] albuterol, [MAR Hold] haloperidol lactate, [MAR Hold] loperamide, [MAR Hold] ondansetron **OR** [MAR Hold] ondansetron (ZOFRAN) IV, [MAR Hold] polyethylene glycol, [MAR Hold] sodium chloride flush Continuous Infusions:   LOS: 20 days  Time spent: Greater than 50% of the 35 minute visit was spent in counseling/coordination of care for the patient as laid out in the A&P.   Dwyane Dee, MD [facility] 12/10/2020, 3:45 PM

## 2020-12-10 NOTE — Progress Notes (Signed)
  Chaplain On-Call responded to a call from [employer] who reported the patient and her husband are requesting a Notary to complete a document.  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 The patient stated that Notarization is needed "to prove that I am still alive to receive my Pension."  Chaplain called Notary Leola Brazil in Administration, who came to the patient's room and viewed the document.  Unknown to this Chaplain, the patient had already signed the document. Butch Penny informed the patient that she needs to witness the patient signing the document. Butch Penny provided the patient with the FAX number in Administration, and encouraged the patient to call the agency to request that a "clean copy" be sent. Butch Penny will monitor the FAX and will follow up with the patient if the new document is received.  Chaplain Pollyann Samples M.Div., [facility]

## 2020-12-10 NOTE — Interval H&P Note (Signed)
History and Physical Interval Note:  12/10/2020 5:06 PM  Tonya Myers  has presented today for surgery, with the diagnosis of acute on chronic renal failure.  The various methods of treatment have been discussed with the patient and family. After consideration of risks, benefits and other options for treatment, the patient has consented to  Procedure(s): TEMPORARY DIALYSIS CATHETER (N/A) as a surgical intervention.  The patient's history has been reviewed, patient examined, no change in status, stable for surgery.  I have reviewed the patient's chart and labs.  Questions were answered to the patient's satisfaction.     Hortencia Pilar

## 2020-12-10 NOTE — Progress Notes (Signed)
Patient off of unit with 1st unit of platelets infusing for temporary cath and then to dialysis. Patient will need 2nd unit of platelets and a CBC 1 hour post infusion.  Samella Parr, RN

## 2020-12-10 NOTE — Progress Notes (Addendum)
Pt arrived for her very first HD in the dialysis suite from special procedures transported by Coralyn Mark, Therapist, sports. Status post placement of ne vs cath in left femoral site. Bp stable but patient was found with irregular heart rhythm and bradycardia as low as low 40's on arrival and once at 38 when the patient yawned. Sherlyn Hay NP has been called and made aware. Pt denies symptoms at this time educated to report any symptoms, Oxygen given for safety with bradycardia. Ok to initiate HD and maintain HR greater than or equal to 45 sustained, otherwise return blood as per NP. Femoral cvc sit was noted with some bleeding on the dressing at arrival, will monitor.

## 2020-12-10 NOTE — Hospital Course (Addendum)
Ms. Bauers is a 55 y.o. female with medical history significant for recent pneumococcal meningitis and bacteremia, depression, hypothyroid, IDDM2 who presented to the ED with AMS.  She underwent extensive work-up after admission with multiple subspecialty consults. Infectious work-up with ID is negative and ID has signed off on 12/09/2020 (see FUO workup below).  She also underwent further work-up with nephrology, oncology, and rheumatology. She has multiple serologies concerning for possible lupus and trying to now obtain renal biopsy to help further diagnose.  Thrombocytopenia has been preventing biopsy obtainment. Due to worsening renal function, she was started on HD.

## 2020-12-10 NOTE — Op Note (Signed)
  OPERATIVE NOTE   PROCEDURE: Insertion of temporary dialysis catheter catheter right common femoral approach.  PRE-OPERATIVE DIAGNOSIS: Acute renal insufficiency secondary to sequela of bacterial meningitis  POST-OPERATIVE DIAGNOSIS: Same  SURGEON: Katha Cabal M.D.  ANESTHESIA: 1% lidocaine local infiltration  ESTIMATED BLOOD LOSS: Minimal cc  INDICATIONS:   Tonya Myers is a 55 y.o. female who presents with acute renal insufficiency which is occurred during this hospitalization.  She is now reached a point that she is requiring hemodialysis at least temporarily.  A temporary try Alysis catheter has been recommended.  Risks and benefits were reviewed all questions answered patient has agreed to proceed.  DESCRIPTION: After obtaining full informed written consent, the patient was positioned supine. The right groin was prepped and draped in a sterile fashion. Ultrasound was placed in a sterile sleeve. Ultrasound was utilized to identify the right common femoral vein which is noted to be echolucent and compressible indicating patency. Images recorded for the permanent record. Under real-time visualization a Seldinger needle is inserted into the vein and the guidewires advanced without difficulty. Small counterincision was made at the wire insertion site. Dilator is passed over the wire and the temporary dialysis catheter catheter is fed over the wire without difficulty.  All lumens aspirate and flush easily and are packed with heparin saline. Catheter secured to the skin of the right thigh with 2-0 silk. A sterile dressing is applied with Biopatch.  COMPLICATIONS: None  CONDITION: Unchanged  Hortencia Pilar Office:  [phone] 12/10/2020, 5:09 PM

## 2020-12-10 NOTE — Progress Notes (Addendum)
PT Cancellation Note  Patient Details Name: Tonya Myers MRN: [id number] DOB: [birth year]   Cancelled Treatment:   PT attempt, MD in room. Will return at more appropriate time. Pt will be starting HD and will required tem cath + platelet transfusions.     Willette Pa 12/10/2020, 11:41 AM

## 2020-12-10 NOTE — Progress Notes (Signed)
This patient had her fisrt Hd tx today, 2 hours, zero net UF. Pt tolerated the treatment, however her new right femoral vas cath continued to ooze pre Hd and during the HD tx. Dressing was changed post Hd, new biopoatch, sterile  guaze and clear dressing was placed. Handoff was given to care RN on unit. RN advised to contact vascular team if dressing becomes saturated with blood again. Sherlyn Hay, NP made aware. Vital signs stable post Hd, cvc locked with normal saline, clamped and capped.

## 2020-12-10 NOTE — TOC Progression Note (Addendum)
Transition of Care [facility]) - Progression Note    Patient Details  Name: Tonya Myers MRN: [id number] Date of Birth: 15-Apr-1966  [facility] [name] Contact  Shelbie Hutching, RN Phone Number: 12/10/2020, 1:16 PM  Clinical Narrative:    Patient will need dialysis once temp catheter can be placed by VIR.  TOC will cont to follow patient.    Expected Discharge Plan: [place] Barriers to Discharge: Continued Medical Work up  Expected Discharge Plan and Services Expected Discharge Plan: [place]   Discharge Planning Services: CM Consult Post Acute Care Choice: [place], [name] Living arrangements for the past 2 months: Single Family Home                 DME Arranged: N/A DME Agency: NA       HH Arranged: PT, OT [place] Agency: [place] ([facility]) Date HH Agency Contacted: 11/24/20 Time [place]: 1419 Representative spoke with at [place]: [place] ([place]) Interventions    Readmission Risk Interventions Readmission Risk Prevention Plan 11/24/2020  Transportation Screening Complete  PCP or Specialist Appt within 3-5 Days Complete  HRI or [place] Complete  Social Work Consult for [place] Planning/Counseling Complete  Palliative Care Screening Not Applicable  Medication Review Press photographer) Complete  Some recent data might be hidden

## 2020-12-11 ENCOUNTER — Encounter (HOSPITAL_COMMUNITY): Payer: Self-pay | Admitting: Oncology

## 2020-12-11 ENCOUNTER — Other Ambulatory Visit: Payer: Self-pay | Admitting: Oncology

## 2020-12-11 ENCOUNTER — Encounter: Payer: Self-pay | Admitting: Vascular Surgery

## 2020-12-11 DIAGNOSIS — D8989 Other specified disorders involving the immune mechanism, not elsewhere classified: Secondary | ICD-10-CM | POA: Diagnosis not present

## 2020-12-11 DIAGNOSIS — D638 Anemia in other chronic diseases classified elsewhere: Secondary | ICD-10-CM

## 2020-12-11 DIAGNOSIS — N179 Acute kidney failure, unspecified: Secondary | ICD-10-CM | POA: Diagnosis not present

## 2020-12-11 DIAGNOSIS — D61818 Other pancytopenia: Secondary | ICD-10-CM

## 2020-12-11 DIAGNOSIS — D696 Thrombocytopenia, unspecified: Secondary | ICD-10-CM

## 2020-12-11 DIAGNOSIS — K859 Acute pancreatitis without necrosis or infection, unspecified: Secondary | ICD-10-CM

## 2020-12-11 DIAGNOSIS — R509 Fever, unspecified: Secondary | ICD-10-CM

## 2020-12-11 DIAGNOSIS — B370 Candidal stomatitis: Secondary | ICD-10-CM

## 2020-12-11 LAB — CBC WITH DIFFERENTIAL/PLATELET
Abs Immature Granulocytes: 0.09 10*3/uL — ABNORMAL HIGH (ref 0.00–0.07)
Basophils Absolute: 0 10*3/uL (ref 0.0–0.1)
Basophils Relative: 0 %
Eosinophils Absolute: 0.1 10*3/uL (ref 0.0–0.5)
Eosinophils Relative: 2 %
HCT: 20.1 % — ABNORMAL LOW (ref 36.0–46.0)
Hemoglobin: 6.7 g/dL — ABNORMAL LOW (ref 12.0–15.0)
Immature Granulocytes: 1 %
Lymphocytes Relative: 19 %
Lymphs Abs: 1.2 10*3/uL (ref 0.7–4.0)
MCH: 27.3 pg (ref 26.0–34.0)
MCHC: 33.3 g/dL (ref 30.0–36.0)
MCV: 82 fL (ref 80.0–100.0)
Monocytes Absolute: 0.3 10*3/uL (ref 0.1–1.0)
Monocytes Relative: 4 %
Neutro Abs: 4.8 10*3/uL (ref 1.7–7.7)
Neutrophils Relative %: 74 %
Platelets: 32 10*3/uL — ABNORMAL LOW (ref 150–400)
RBC: 2.45 MIL/uL — ABNORMAL LOW (ref 3.87–5.11)
RDW: 16.8 % — ABNORMAL HIGH (ref 11.5–15.5)
WBC: 6.4 10*3/uL (ref 4.0–10.5)
nRBC: 0 % (ref 0.0–0.2)

## 2020-12-11 LAB — BPAM PLATELET PHERESIS
Blood Product Expiration Date: 202208182359
Blood Product Expiration Date: 202208202359
ISSUE DATE / TIME: 202208171254
ISSUE DATE / TIME: 202208172020
Unit Type and Rh: 6200
Unit Type and Rh: 6200

## 2020-12-11 LAB — CBC
HCT: 23 % — ABNORMAL LOW (ref 36.0–46.0)
Hemoglobin: 7.7 g/dL — ABNORMAL LOW (ref 12.0–15.0)
MCH: 27.2 pg (ref 26.0–34.0)
MCHC: 33.5 g/dL (ref 30.0–36.0)
MCV: 81.3 fL (ref 80.0–100.0)
Platelets: 36 10*3/uL — ABNORMAL LOW (ref 150–400)
RBC: 2.83 MIL/uL — ABNORMAL LOW (ref 3.87–5.11)
RDW: 16.7 % — ABNORMAL HIGH (ref 11.5–15.5)
WBC: 6.3 10*3/uL (ref 4.0–10.5)
nRBC: 0 % (ref 0.0–0.2)

## 2020-12-11 LAB — CULTURE, BLOOD (ROUTINE X 2)
Culture: NO GROWTH
Culture: NO GROWTH
Special Requests: ADEQUATE
Special Requests: ADEQUATE

## 2020-12-11 LAB — COMPREHENSIVE METABOLIC PANEL
ALT: 17 U/L (ref 0–44)
AST: 47 U/L — ABNORMAL HIGH (ref 15–41)
Albumin: 2.1 g/dL — ABNORMAL LOW (ref 3.5–5.0)
Alkaline Phosphatase: 70 U/L (ref 38–126)
Anion gap: 8 (ref 5–15)
BUN: 49 mg/dL — ABNORMAL HIGH (ref 6–20)
CO2: 26 mmol/L (ref 22–32)
Calcium: 7.4 mg/dL — ABNORMAL LOW (ref 8.9–10.3)
Chloride: 104 mmol/L (ref 98–111)
Creatinine, Ser: 3.36 mg/dL — ABNORMAL HIGH (ref 0.44–1.00)
GFR, Estimated: 15 mL/min — ABNORMAL LOW (ref >60)
Glucose, Bld: 74 mg/dL (ref 70–99)
Potassium: 4.1 mmol/L (ref 3.5–5.1)
Sodium: 138 mmol/L (ref 135–145)
Total Bilirubin: 0.8 mg/dL (ref 0.3–1.2)
Total Protein: 5.1 g/dL — ABNORMAL LOW (ref 6.5–8.1)

## 2020-12-11 LAB — MAGNESIUM: Magnesium: 1.8 mg/dL (ref 1.7–2.4)

## 2020-12-11 LAB — CARDIOLIPIN ANTIBODIES, IGG, IGM, IGA
Anticardiolipin IgA: <9 APL U/mL (ref 0–11)
Anticardiolipin IgG: <9 GPL U/mL (ref 0–14)
Anticardiolipin IgM: 75 MPL U/mL — ABNORMAL HIGH (ref 0–12)

## 2020-12-11 LAB — PREPARE PLATELET PHERESIS
Unit division: 0
Unit division: 0

## 2020-12-11 LAB — CULTURE, FUNGUS WITHOUT SMEAR

## 2020-12-11 LAB — EHRLICHIA ANTIBODY PANEL
E chaffeensis (HGE) Ab, IgG: NEGATIVE
E chaffeensis (HGE) Ab, IgM: NEGATIVE
E. Chaffeensis (HME) IgM Titer: NEGATIVE
E.Chaffeensis (HME) IgG: NEGATIVE

## 2020-12-11 LAB — LYME DISEASE SEROLOGY W/REFLEX: Lyme Total Antibody EIA: NEGATIVE

## 2020-12-11 LAB — PREPARE RBC (CROSSMATCH)

## 2020-12-11 LAB — GLUCOSE, CAPILLARY
Glucose-Capillary: 61 mg/dL — ABNORMAL LOW (ref 70–99)
Glucose-Capillary: 145 mg/dL — ABNORMAL HIGH (ref 70–99)
Glucose-Capillary: 97 mg/dL (ref 70–99)
Glucose-Capillary: 186 mg/dL — ABNORMAL HIGH (ref 70–99)
Glucose-Capillary: 207 mg/dL — ABNORMAL HIGH (ref 70–99)

## 2020-12-11 MED ORDER — PENTAFLUOROPROP-TETRAFLUOROETH EX AERO: 1.0000 "application " | INHALATION_SPRAY | CUTANEOUS | Status: DC | PRN

## 2020-12-11 MED ORDER — HEPARIN SODIUM (PORCINE) 1000 UNIT/ML DIALYSIS: 1000.0000 [IU] | INTRAMUSCULAR | Status: DC | PRN

## 2020-12-11 MED ORDER — SODIUM CHLORIDE 0.9% IV SOLUTION: Freq: Once | INTRAVENOUS | Status: DC

## 2020-12-11 MED ORDER — SODIUM CHLORIDE 0.9 % IV SOLN: 100.0000 mL | INTRAVENOUS | Status: DC | PRN

## 2020-12-11 MED ORDER — HEPARIN SODIUM (PORCINE) 1000 UNIT/ML IJ SOLN
INTRAMUSCULAR | Status: AC
  Filled 2020-12-11: qty 1

## 2020-12-11 MED ORDER — ALTEPLASE 2 MG IJ SOLR: 2.0000 mg | Freq: Once | INTRAMUSCULAR | Status: DC | PRN

## 2020-12-11 MED ORDER — LIDOCAINE HCL (PF) 1 % IJ SOLN: 5.0000 mL | INTRAMUSCULAR | Status: DC | PRN

## 2020-12-11 MED ORDER — PNEUMOCOCCAL 13-VAL CONJ VACC IM SUSP
0.5000 mL | INTRAMUSCULAR | Status: DC
  Filled 2020-12-11: qty 0.5

## 2020-12-11 MED ORDER — LIDOCAINE-PRILOCAINE 2.5-2.5 % EX CREA: 1.0000 "application " | TOPICAL_CREAM | CUTANEOUS | Status: DC | PRN

## 2020-12-11 NOTE — Progress Notes (Signed)
[facility]  ROUNDING NOTE   Subjective:   Tonya Myers is a 55 y.o. female with medical conditions including pneumococcal meningitis bacteremia, hypothyroidism, Type 2 diabetes, and lymphoma in remission. She presents to the ED with confusion and is admitted for Altered mental status [R41.82] Encephalopathy acute [G93.40] Altered mental status, unspecified altered mental status type [R41.82] Sepsis with encephalopathy without septic shock, due to unspecified organism (HCC) [A41.9, R65.20, G93.40]  Patient seen during dialysis   HEMODIALYSIS FLOWSHEET:  Blood Flow Rate (mL/min): 252 mL/min Arterial Pressure (mmHg): -90 mmHg Venous Pressure (mmHg): 90 mmHg Transmembrane Pressure (mmHg): 60 mmHg Ultrafiltration Rate (mL/min): 200 mL/min Dialysate Flow Rate (mL/min): 500 ml/min Conductivity: Machine : 13.7 Conductivity: Machine : 13.7 Dialysis Fluid Bolus: Normal Saline Bolus Amount (mL): 300 mL  Low grade temp recorded Denies pain and discomfort   Objective:  Vital signs in last 24 hours:  Temp:  [98.6 F (37 C)-100.9 F (38.3 C)] 100.8 F (38.2 C) (08/18 1045) Pulse Rate:  [55-83] 60 (08/18 1115) Resp:  [10-23] 20 (08/18 1115) BP: (121-156)/(49-97) 138/70 (08/18 1115) SpO2:  [94 %-100 %] 94 % (08/18 0815) Weight:  [94.4 kg] 94.4 kg (08/17 1345)  Weight change:  Filed Weights   12/01/20 0920 12/01/20 1730 12/10/20 1345  Weight: 94.9 kg 94.4 kg 94.4 kg    Intake/Output: I/O last 3 completed shifts: In: 918 [P.O.:360; Blood:558] Out: 1260 [Urine:1350]   Intake/Output this shift:  No intake/output data recorded.  Physical Exam: General: NAD, laying in bed  Head: Normocephalic, atraumatic. Moist oral mucosal membranes  Eyes: Anicteric  Lungs:  Clear to auscultation, normal effort  Heart: Regular rate and rhythm  Abdomen:  Soft, nontender  Extremities: trace peripheral edema.  Neurologic: Alert, moving all four extremities  Skin: No lesions   Access RUE femoral temp cath placed by Schnier    Basic Metabolic Panel: Recent Labs  Lab 12/05/20 0521 12/06/20 1035 12/07/20 0511 12/08/20 0719 12/09/20 0512 12/10/20 0508 12/11/20 0437  NA 138   < > 132* 136 136 137 138  K 3.6   < > 4.5 4.6 4.7 4.7 4.1  CL 113*   < > 109 110 109 109 104  CO2 20*   < > 18* 21* 21* 21* 26  GLUCOSE 64*   < > 395* 394* 257* 132* 74  BUN 23*   < > 42* 56* 67* 75* 49*  CREATININE 2.88*   < > 3.38* 3.72* 4.10* 4.42* 3.36*  CALCIUM 7.7*   < > 7.6* 7.9* 7.9* 8.3* 7.4*  MG 1.7  --   --  2.0  --   --  1.8   < > = values in this interval not displayed.     Liver Function Tests: Recent Labs  Lab 12/09/20 1020 12/11/20 0437  AST 37 47*  ALT 9 17  ALKPHOS 72 70  BILITOT 0.5 0.8  PROT 5.5* 5.1*  ALBUMIN 2.2* 2.1*    Recent Labs  Lab 12/05/20 0521 12/06/20 1035 12/07/20 0511  LIPASE 130* 90* 135*    No results for input(s): AMMONIA in the last 168 hours.  CBC: Recent Labs  Lab 12/07/20 0511 12/08/20 0719 12/09/20 0512 12/10/20 0508 12/11/20 0437  WBC 5.1 7.6 8.9 8.0 6.4  NEUTROABS 4.1 6.5 7.0 5.7 4.8  HGB 7.4* 7.4* 7.4* 7.4* 6.7*  HCT 21.8* 22.4* 21.9* 22.3* 20.1*  MCV 79.3* 81.2 80.5 80.8 82.0  PLT 34* 30* 28* 21* 32*     Cardiac Enzymes: No results   for input(s): CKTOTAL, CKMB, CKMBINDEX, TROPONINI in the last 168 hours.   BNP: Invalid input(s): POCBNP  CBG: Recent Labs  Lab 12/10/20 0751 12/10/20 1303 12/10/20 2148 12/11/20 0819 12/11/20 0901  GLUCAP 107* 136* 127* 61* 145*     Microbiology: Results for orders placed or performed during the hospital encounter of 11/19/20  Blood culture (routine x 2)     Status: None   Collection Time: 11/19/20  4:47 PM   Specimen: BLOOD  Result Value Ref Range Status   Specimen Description BLOOD RIGHT ANTECUBITAL  Final   Special Requests   Final    BOTTLES DRAWN AEROBIC AND ANAEROBIC Blood Culture adequate volume   Culture   Final    NO GROWTH 5 DAYS Performed at  [facility], [address] [place], [place] [address]    Report Status 11/24/2020 FINAL  Final  Resp Panel by RT-PCR (Flu A&B, Covid) Nasopharyngeal Swab     Status: None   Collection Time: 11/19/20  4:48 PM   Specimen: Nasopharyngeal Swab; Nasopharyngeal(NP) swabs in vial transport medium  Result Value Ref Range Status   SARS Coronavirus 2 by RT PCR NEGATIVE NEGATIVE Final    Comment: (NOTE) SARS-CoV-2 target nucleic acids are NOT DETECTED.  The SARS-CoV-2 RNA is generally detectable in upper respiratory specimens during the acute phase of infection. The lowest concentration of SARS-CoV-2 viral copies this assay can detect is 138 copies/mL. A negative result does not preclude SARS-Cov-2 infection and should not be used as the sole basis for treatment or other patient management decisions. A negative result may occur with  improper specimen collection/handling, submission of specimen other than nasopharyngeal swab, presence of viral mutation(s) within the areas targeted by this assay, and inadequate number of viral copies(<138 copies/mL). A negative result must be combined with clinical observations, patient history, and epidemiological information. The expected result is Negative.  Fact Sheet for Patients:  EntrepreneurPulse.com.au  Fact Sheet for Healthcare Providers:  IncredibleEmployment.be  This test is no t yet approved or cleared by the [place] FDA and  has been authorized for detection and/or diagnosis of SARS-CoV-2 by FDA under an Emergency Use Authorization (EUA). This EUA will remain  in effect (meaning this test can be used) for the duration of the COVID-19 declaration under Section 564(b)(1) of the Act, 21 U.S.C.section 360bbb-3(b)(1), unless the authorization is terminated  or revoked sooner.       Influenza A by PCR NEGATIVE NEGATIVE Final   Influenza B by PCR NEGATIVE NEGATIVE Final    Comment: (NOTE) The  Xpert Xpress SARS-CoV-2/FLU/RSV plus assay is intended as an aid in the diagnosis of influenza from Nasopharyngeal swab specimens and should not be used as a sole basis for treatment. Nasal washings and aspirates are unacceptable for Xpert Xpress SARS-CoV-2/FLU/RSV testing.  Fact Sheet for Patients: EntrepreneurPulse.com.au  Fact Sheet for Healthcare Providers: IncredibleEmployment.be  This test is not yet approved or cleared by the [place] FDA and has been authorized for detection and/or diagnosis of SARS-CoV-2 by FDA under an Emergency Use Authorization (EUA). This EUA will remain in effect (meaning this test can be used) for the duration of the COVID-19 declaration under Section 564(b)(1) of the Act, 21 U.S.C. section 360bbb-3(b)(1), unless the authorization is terminated or revoked.  Performed at [facility], [address] [place], [place] [address]   Blood culture (routine x 2)     Status: None   Collection Time: 11/19/20  6:46 PM   Specimen: BLOOD  Result Value Ref Range Status   Specimen Description BLOOD RIGHT ANTECUBITAL  Final   Special Requests   Final    BOTTLES DRAWN AEROBIC AND ANAEROBIC Blood Culture adequate volume   Culture   Final    NO GROWTH 5 DAYS Performed at [facility], [address] [place], [place] [address]    Report Status 11/24/2020 FINAL  Final  Urine Culture     Status: Abnormal   Collection Time: 11/19/20  9:33 PM   Specimen: Urine, Clean Catch  Result Value Ref Range Status   Specimen Description   Final    URINE, CLEAN CATCH Performed at [facility], [address] [place], [place] [address]    Special Requests   Final    NONE Performed at [facility], [address] [place], [place] [address]    Culture (A)  Final    <10,000 COLONIES/mL INSIGNIFICANT GROWTH Performed at [facility], [address] [place], [place] [address]    Report  Status 11/21/2020 FINAL  Final  Culture, fungus without smear     Status: None (Preliminary result)   Collection Time: 11/20/20 11:09 AM   Specimen: CSF; Cerebrospinal Fluid  Result Value Ref Range Status   Specimen Description   Final    CSF Performed at [facility], [address] [place], [place] [address]    Special Requests   Final    NONE Performed at [facility], [address] [place], [place] [address]    Culture   Final    NO FUNGUS ISOLATED AFTER 20 DAYS Performed at [facility], [address] [place], [place] [address]    Report Status PENDING  Incomplete  CSF culture w Gram Stain     Status: None   Collection Time: 11/20/20 11:09 AM   Specimen: PATH Cytology CSF; Cerebrospinal Fluid  Result Value Ref Range Status   Specimen Description   Final    CSF Performed at [facility], [address] [place], [place] [address]    Special Requests   Final    NONE Performed at [facility], [address] [place], [place] [address]    Gram Stain   Final    NO ORGANISMS SEEN WBC SEEN RED BLOOD CELLS PRESENT Performed at [facility], [address] [place], [place] [address]    Culture   Final    NO GROWTH 3 DAYS Performed at [facility], [address] [place], [place] [address]    Report Status 11/23/2020 FINAL  Final  Fungus Culture With Stain     Status: None (Preliminary result)   Collection Time: 11/20/20 11:09 AM   Specimen: PATH Cytology CSF; Cerebrospinal Fluid  Result Value Ref Range Status   Fungus Stain Final report  Final    Comment: (NOTE) Performed At: [facility] [address] [place], [place] [id number] Nagendra Sanjai MD Ph:[phone]    Fungus (Mycology) Culture PENDING  Incomplete   Fungal Source CSF  Final    Comment: Performed at [facility], [address] [place], [place] [address]  Fungus Culture Result     Status: None   Collection Time:  11/20/20 11:09 AM  Result Value Ref Range Status   Result 1 Comment  Final    Comment: (NOTE) KOH/Calcofluor preparation:  no fungus observed. Performed At: [facility] [address] ,  [id number] Nagendra Sanjai MD Ph:[phone]   Resp   Panel by RT-PCR (Flu A&B, Covid) Nasopharyngeal Swab     Status: None   Collection Time: 11/27/20  1:39 PM   Specimen: Nasopharyngeal Swab; Nasopharyngeal(NP) swabs in vial transport medium  Result Value Ref Range Status   SARS Coronavirus 2 by RT PCR NEGATIVE NEGATIVE Final    Comment: (NOTE) SARS-CoV-2 target nucleic acids are NOT DETECTED.  The SARS-CoV-2 RNA is generally detectable in upper respiratory specimens during the acute phase of infection. The lowest concentration of SARS-CoV-2 viral copies this assay can detect is 138 copies/mL. A negative result does not preclude SARS-Cov-2 infection and should not be used as the sole basis for treatment or other patient management decisions. A negative result may occur with  improper specimen collection/handling, submission of specimen other than nasopharyngeal swab, presence of viral mutation(s) within the areas targeted by this assay, and inadequate number of viral copies(<138 copies/mL). A negative result must be combined with clinical observations, patient history, and epidemiological information. The expected result is Negative.  Fact Sheet for Patients:  EntrepreneurPulse.com.au  Fact Sheet for Healthcare Providers:  IncredibleEmployment.be  This test is no t yet approved or cleared by the [place] FDA and  has been authorized for detection and/or diagnosis of SARS-CoV-2 by FDA under an Emergency Use Authorization (EUA). This EUA will remain  in effect (meaning this test can be used) for the duration of the COVID-19 declaration under Section 564(b)(1) of the Act, 21 U.S.C.section 360bbb-3(b)(1), unless the authorization is  terminated  or revoked sooner.       Influenza A by PCR NEGATIVE NEGATIVE Final   Influenza B by PCR NEGATIVE NEGATIVE Final    Comment: (NOTE) The Xpert Xpress SARS-CoV-2/FLU/RSV plus assay is intended as an aid in the diagnosis of influenza from Nasopharyngeal swab specimens and should not be used as a sole basis for treatment. Nasal washings and aspirates are unacceptable for Xpert Xpress SARS-CoV-2/FLU/RSV testing.  Fact Sheet for Patients: EntrepreneurPulse.com.au  Fact Sheet for Healthcare Providers: IncredibleEmployment.be  This test is not yet approved or cleared by the [place] FDA and has been authorized for detection and/or diagnosis of SARS-CoV-2 by FDA under an Emergency Use Authorization (EUA). This EUA will remain in effect (meaning this test can be used) for the duration of the COVID-19 declaration under Section 564(b)(1) of the Act, 21 U.S.C. section 360bbb-3(b)(1), unless the authorization is terminated or revoked.  Performed at [facility], [address] [place], [place] [address]   Urine Culture     Status: Abnormal   Collection Time: 11/27/20  2:15 PM   Specimen: Urine, Clean Catch  Result Value Ref Range Status   Specimen Description   Final    URINE, CLEAN CATCH Performed at [facility], [address], [place], [place] [address]    Special Requests   Final    NONE Performed at [facility], [place], [place] [address]    Culture 20,000 COLONIES/mL ENTEROBACTER AEROGENES (A)  Final   Report Status 11/30/2020 FINAL  Final   Organism ID, Bacteria ENTEROBACTER AEROGENES (A)  Final      Susceptibility   Enterobacter aerogenes - MIC*    CEFAZOLIN >=64 RESISTANT Resistant     CEFEPIME 1 SENSITIVE Sensitive     CEFTRIAXONE >=64 RESISTANT Resistant     CIPROFLOXACIN <=0.25 SENSITIVE Sensitive     GENTAMICIN <=1 SENSITIVE Sensitive     IMIPENEM <=0.25 SENSITIVE  Sensitive     NITROFURANTOIN 64 INTERMEDIATE Intermediate  TRIMETH/SULFA <=20 SENSITIVE Sensitive     PIP/TAZO >=128 RESISTANT Resistant     * 20,000 COLONIES/mL ENTEROBACTER AEROGENES  CULTURE, BLOOD (ROUTINE X 2) w Reflex to ID Panel     Status: None   Collection Time: 11/27/20  8:23 PM   Specimen: BLOOD  Result Value Ref Range Status   Specimen Description BLOOD LAC  Final   Special Requests   Final    BOTTLES DRAWN AEROBIC AND ANAEROBIC Blood Culture results may not be optimal due to an inadequate volume of blood received in culture bottles   Culture   Final    NO GROWTH 5 DAYS Performed at [facility], [address] [place], [place] [address]    Report Status 12/02/2020 FINAL  Final  CULTURE, BLOOD (ROUTINE X 2) w Reflex to ID Panel     Status: None   Collection Time: 11/27/20  9:32 PM   Specimen: BLOOD LEFT HAND  Result Value Ref Range Status   Specimen Description BLOOD LEFT HAND  Final   Special Requests   Final    BOTTLES DRAWN AEROBIC AND ANAEROBIC Blood Culture adequate volume   Culture   Final    NO GROWTH 5 DAYS Performed at [facility], [address] [place], [place] [address]    Report Status 12/02/2020 FINAL  Final  CULTURE, BLOOD (ROUTINE X 2) w Reflex to ID Panel     Status: None   Collection Time: 12/06/20 10:35 AM   Specimen: BLOOD  Result Value Ref Range Status   Specimen Description BLOOD LT KNUCKLE  Final   Special Requests   Final    BOTTLES DRAWN AEROBIC AND ANAEROBIC Blood Culture adequate volume   Culture   Final    NO GROWTH 5 DAYS Performed at [facility], [address] [place], [place] [address]    Report Status 12/11/2020 FINAL  Final  CULTURE, BLOOD (ROUTINE X 2) w Reflex to ID Panel     Status: None   Collection Time: 12/06/20 10:35 AM   Specimen: BLOOD  Result Value Ref Range Status   Specimen Description BLOOD BLOOD LEFT WRIST  Final   Special Requests AEROBIC BOTTLE ONLY Blood Culture adequate  volume  Final   Culture   Final    NO GROWTH 5 DAYS Performed at [facility], [address] [place], [place] [address]    Report Status 12/11/2020 FINAL  Final    Coagulation Studies: Recent Labs    12/09/20 1020  LABPROT 14.1  INR 1.1     Urinalysis: No results for input(s): COLORURINE, LABSPEC, PHURINE, GLUCOSEU, HGBUR, BILIRUBINUR, KETONESUR, PROTEINUR, UROBILINOGEN, NITRITE, LEUKOCYTESUR in the last 72 hours.  Invalid input(s): APPERANCEUR     Imaging: PERIPHERAL VASCULAR CATHETERIZATION  Result Date: 12/10/2020 See surgical note for result.    Medications:    sodium chloride     sodium chloride       sodium chloride   Intravenous Once   amLODipine  5 mg Oral Daily   Chlorhexidine Gluconate Cloth  6 each Topical Daily   diltiazem  120 mg Oral Daily   feeding supplement  1 Container Oral TID BM   insulin aspart  0-15 Units Subcutaneous TID WC   levothyroxine  100 mcg Oral QAC breakfast   magnesium oxide  800 mg Oral BID   mirtazapine  15 mg Oral QHS   multivitamin with minerals  1 tablet Oral Daily   pantoprazole  40 mg Oral Daily  psyllium  1 packet Oral Daily   rosuvastatin  10 mg Oral QHS   sodium bicarbonate  650 mg Oral TID   sodium chloride flush  10-40 mL Intracatheter Q12H   thiamine  100 mg Oral Daily   sodium chloride, sodium chloride, acetaminophen **OR** acetaminophen, albuterol, alteplase, haloperidol lactate, heparin, lidocaine (PF), lidocaine-prilocaine, loperamide, ondansetron **OR** ondansetron (ZOFRAN) IV, pentafluoroprop-tetrafluoroeth, polyethylene glycol, sodium chloride flush  Assessment/ Plan:  Ms. Jalayah Gutridge is a 55 y.o.  female with medical conditions including pneumococcal meningitis bacteremia, hypothyroidism, Type 2 diabetes, and lymphoma in remission. She presents to the ED with confusion and is admitted for Altered mental status [R41.82] Encephalopathy acute [G93.40] Altered mental status, unspecified  altered mental status type [R41.82] Sepsis with encephalopathy without septic shock, due to unspecified organism ([place]) [A41.9, R65.20, G93.40]   Acute Kidney Injury/microscopic hematuria/ proteinuria with baseline creatinine 0.49 on 11/22/20.  Acute kidney injury secondary to contrast induced ATN. IV contrast exposure on November 21, 2020.  UA shows protienuria, >50 RBCs, 21-50 WBC, microbiology 20k colonies- Enterobacter. Renal ultrasound negative for mass and obstruction. Questionable small urterocele. No acute need for dialysis at this time Complements decreased and ANCA positive. Due to conflicting results, will retest for ANCA and other labs to verify results. Labs pending -Dialysis initiated after temp cath placement yesterday. Tolerated well. Receiving dialysis currently with no UF. Will monitor am labs to determine need for dialysis tomorrow. - Patient received platelets yesterday. Platelets remain decreased at 31. Radiologist unwilling to proceed with renal biopsy with increased bleeding risk. After discussing with team, decision was made to proceed with rituxan empirically until renal biopsy can be performed. Continue platelet transfusions.  - Will continue to monitor closely  Lab Results  Component Value Date   CREATININE 3.36 (H) 12/11/2020   CREATININE 4.42 (H) 12/10/2020   CREATININE 4.10 (H) 12/09/2020    Intake/Output Summary (Last 24 hours) at 12/11/2020 1202 Last data filed at 12/11/2020 0414 Gross per 24 hour  Intake 558 ml  Output 710 ml  Net -152 ml    2. Anemia  with history of CLL pancytopenia Lab Results  Component Value Date   HGB 6.7 (L) 12/11/2020   Bone marrow biopsy 12/04/20, limited marrow material with trilineage hematopoiesis. Recommending repeat bone marrow biopsy Oncology following    LOS: Willey 8/18/202212:02 PM

## 2020-12-11 NOTE — Assessment & Plan Note (Addendum)
-   Serologic work-up indicative of underlying autoimmune process; concern is Lupus vs vasculitis vs RPGN  - s/p 3 days steroids (8/12 - 8/14, solumedrol 1g daily) - oncology, nephrology, and rheumatology following - also started on Rituxan on 8/19 - underwent left renal biopsy on 12/16/20. Follow up findings -Case was discussed with [facility].  If she does warrant plasmapheresis she would then be accepted for transfer but until then had no indication.  Will need to call back [facility] to discuss transfer if and when indicated.

## 2020-12-11 NOTE — Assessment & Plan Note (Signed)
-  Replete and recheck as needed 

## 2020-12-11 NOTE — Assessment & Plan Note (Signed)
-   Continue Cardizem and hydralazine

## 2020-12-11 NOTE — Assessment & Plan Note (Signed)
Unclear etiology No reported history of alcohol intake, no radiographic evidence of gallstones Possible drug-induced Initial abdominal examination negative for tenderness Lipase trending down with IVF, but much slowly than usual. Triglycerides negative - now resolved

## 2020-12-11 NOTE — Assessment & Plan Note (Addendum)
Unclear etiology --meningitis ruled out by LP. CT head neg for new stroke. Hematology ruled out CLL as the culprit. No seizure on EEG. --mental status dramatically improved with treatment of pancreatitis and IV thiamine repletion. --MRI brain, no acute finding -- Completed high-dose IV thiamine x3 days

## 2020-12-11 NOTE — Assessment & Plan Note (Addendum)
-   also see pancytopenia below - PLTC down to 21k on 8/17; no overt bleeding but in efforts to facilitate renal bx will transfuse 2 packs platelets  - PLTC only up to 32k on 8/18; some oozing as expected around trialysis cath; repeat 1 unit Platelet on 8/18 - still low on 8/20; [id number] ordered per nephro on 8/20 - s/p 2 units Platelets on 8/20 - s/p more platelets on 8/22 and 8/23

## 2020-12-11 NOTE — Assessment & Plan Note (Addendum)
-   ID has followed and signed off on 8/16 (see their note as well) - fevers had defervesced on 8/12 but then recurred on 8/19 prior to Rituxan and then defervesced some; again having fevers on 8/21 - continue trending fever curve; unclear etiology given negative infectious workup recently this may be related to underlying autoimmune process as well - asked ID to weigh in briefly on 8/23, but fevers at this time are still suspected due to underlying autoimmune process - follow up UA/culture and blood cultures

## 2020-12-11 NOTE — Assessment & Plan Note (Signed)
-   baseline creat ~0.7 and renal function has continued to deteriorate - nephrology following as well, greatly appreciate assistance - s/p femoral trialysis on 8/17; started 1st HD session on 8/17 as well - continue HD per nephrology

## 2020-12-11 NOTE — Assessment & Plan Note (Addendum)
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

## 2020-12-11 NOTE — Progress Notes (Signed)
Pt. tolerated tx w/o incident. Completed platelet transfusion no adverse reaction. Drsg. intact. Maintained BFR throughout tx. No meds given. Pt scheduled for 3rd tx with increase in BFR.

## 2020-12-11 NOTE — Assessment & Plan Note (Signed)
Continue statin. 

## 2020-12-11 NOTE — Assessment & Plan Note (Signed)
Continue Synthroid °

## 2020-12-11 NOTE — Progress Notes (Signed)
Dr Sabino Gasser made aware that pts pre blood transfusion temp 100.6, tylenol was given pre blood, MD acknowledge, no new orders

## 2020-12-11 NOTE — Evaluation (Signed)
Physical Therapy Re-Evaluation Patient Details Name: Shterna Laramee MRN: [id number] DOB: [birth year] Today's Date: 12/11/2020   History of Present Illness  Pt admitted to [facility] on 11/19/20 for decreased appetite, vomiting, AMS, and lethargy. On EMS assessment, pt was found to have fever 102 and AMS with confusion and left sided weakness.  Found to have pancytopenia, oncology was consulted due to hx of CLL, but so far no concern of CLL relapse. ID also on board, initially concerning for meningitis due to hx of pneumococcal meningitis. PMH significant for: DDD, cervical radiculopathy, anxiety, DM, HTN, hypothyroidism, CLL, asthma, cLBP, vertigo, and hx of stroke.    Clinical Impression  PT re-evaluation completed as patient with pronged hospital stay with ongoing medical issues. Patient now has a temporary femoral dialysis access in right groin with hemodialysis initiated yesterday. Patient will be limited to in bed activity and avoiding right hip flexion until catheter is removed. Patient participated in UE and LE exercises and is generally deconditioned/fatigued with activity today. She has consistently required +2 person assistance (before femoral access) for standing and short distance ambulation. Given ongoing acute medial issues, goals and care plan adjusted accordingly. Recommend to continue PT to maximize independence and decrease caregiver burden. Will proceed with further out of bed mobility efforts when femoral line is removed.    Follow Up Recommendations [name]    Equipment Recommendations   (to be determined at next level of care)    Recommendations for Other Services       Precautions / Restrictions Precautions Precautions: Fall Precaution Comments: currently has a temporary femoral trialysis catheter in right groin, avoid hip flexion Restrictions Weight Bearing Restrictions: No Other Position/Activity Restrictions: currently has a temporary femoral trialysis catheter in right  groin, avoid hip flexion      Mobility  Bed Mobility Overal bed mobility: Needs Assistance             General bed mobility comments: bed mobility not assessed at this time due to femoral access in right groin. educated patient on positioning restrictions and to avoid right hip flexion with routine mobility. encouraged AROM of BUE and LLE as able    Transfers                    Ambulation/Gait                Stairs            Wheelchair Mobility    Modified Rankin (Stroke Patients Only)       Balance                                             Pertinent Vitals/Pain Pain Assessment: No/denies pain    Home Living Family/patient expects to be discharged to:: Private residence                      Prior Function                 Hand Dominance        Extremity/Trunk Assessment   Upper Extremity Assessment Upper Extremity Assessment: Generalized weakness    Lower Extremity Assessment Lower Extremity Assessment: Generalized weakness (did not perform active ROM of right hip due to femoral line. patient was unable to complete short arc quad on the right without assistance)  Communication      Cognition Arousal/Alertness: Awake/alert Behavior During Therapy: WFL for tasks assessed/performed Overall Cognitive Status: Within Functional Limits for tasks assessed                                 General Comments: patient is able to follow single step commands consistently      General Comments      Exercises General Exercises - Upper Extremity Shoulder Flexion: AAROM;Strengthening;Both;10 reps;Supine Elbow Flexion: AAROM;Strengthening;Both;10 reps;Supine Elbow Extension: AAROM;Both;10 reps;Supine Wrist Flexion: AAROM;Strengthening;Both;10 reps;Supine Wrist Extension: AAROM;Strengthening;Both;10 reps;Supine General Exercises - Lower Extremity Ankle Circles/Pumps:  AROM;Strengthening;Both;15 reps;Supine Short Arc Quad: AAROM;Strengthening;Both;15 reps;Supine Heel Slides: AAROM;Strengthening;Left;10 reps;Supine Hip ABduction/ADduction: AAROM;Strengthening;Left;15 reps;Supine Straight Leg Raises: AROM;Strengthening;Left;10 reps;Supine Other Exercises Other Exercises: verbal and visual cues for exercise technique for strengthening. no right hip exercise were performed due to femoral line. Sp02 95% and heart rate 81bpm   Assessment/Plan    PT Assessment Patient needs continued PT services  PT Problem List Decreased strength;Decreased activity tolerance;Decreased balance;Decreased mobility;Decreased coordination;Decreased cognition;Decreased safety awareness;Impaired tone       PT Treatment Interventions Gait training;Functional mobility training;Therapeutic activities;Therapeutic exercise;Balance training;Neuromuscular re-education    PT Goals (Current goals can be found in the Care Plan section)  Acute Rehab PT Goals Patient Stated Goal: to have less stiffness and get stronger PT Goal Formulation: With patient Time For Goal Achievement: 12/25/20 Potential to Achieve Goals: Fair    Frequency Min 2X/week   Barriers to discharge Inaccessible home environment      Co-evaluation               AM-PAC PT "6 Clicks" Mobility  Outcome Measure Help needed turning from your back to your side while in a flat bed without using bedrails?: A Lot Help needed moving from lying on your back to sitting on the side of a flat bed without using bedrails?: A Lot Help needed moving to and from a bed to a chair (including a wheelchair)?: Total Help needed standing up from a chair using your arms (e.g., wheelchair or bedside chair)?: Total Help needed to walk in hospital room?: Total Help needed climbing 3-5 steps with a railing? : Total 6 Click Score: 8    End of Session   Activity Tolerance: Patient limited by fatigue;Treatment limited secondary to  medical complications (Comment) Patient left: in bed;with call bell/phone within reach;with bed alarm set;with family/visitor present Nurse Communication: Mobility status PT Visit Diagnosis: Unsteadiness on feet (R26.81);Muscle weakness (generalized) (M62.81);Difficulty in walking, not elsewhere classified (R26.2);Hemiplegia and hemiparesis Hemiplegia - Right/Left: Right    Time: 2683-4196 PT Time Calculation (min) (ACUTE ONLY): 18 min   Charges:   PT Evaluation $PT Re-evaluation: 1 Re-eval PT Treatments $Therapeutic Exercise: 8-22 mins        Minna Merritts, PT, MPT   Percell Locus 12/11/2020, 2:43 PM

## 2020-12-11 NOTE — Assessment & Plan Note (Signed)
Patient with clinical indications of thrush Associated with difficulty swallowing Patient states poor p.o. intake is driven by appetite rather than pain --s/p Diflucan for 3 doses

## 2020-12-11 NOTE — Progress Notes (Signed)
Progress Note    Tonya Myers   RAX:[id number]  DOB: [birth year]  DOA: 11/19/2020     21  PCP: [name], MD  Initial CC: AMS  Hospital Course: Tonya Myers is a 55 y.o. female with medical history significant for recent pneumococcal meningitis and bacteremia, depression, hypothyroid, IDDM2 who presented to the ED with AMS.  She underwent extensive work-up after admission with multiple subspecialty consults. Infectious work-up with ID is negative and ID has signed off on 12/09/2020 (see FUO workup below).  She also underwent further work-up with nephrology, oncology, and rheumatology. She has multiple serologies concerning for possible lupus and trying to now obtain renal biopsy to help further diagnose.  Thrombocytopenia has been preventing biopsy obtainment. Due to worsening renal function, nephrology also considering possible initiation of dialysis.  Interval History:  No events overnight.  Patient underwent femoral access for starting dialysis and underwent dialysis yesterday, tolerated well.  Seen this morning in dialysis again.  Also updated her husband who was in the room waiting for her while gone. Plan is for transfusion of blood and platelets today followed by initiation of Rituxan tomorrow.  ROS: Constitutional: positive for fatigue and malaise, negative for chills and fevers, Respiratory: negative for cough and sputum, Cardiovascular: negative for chest pain, and Gastrointestinal: negative for abdominal pain  Assessment & Plan:  * 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[place]) - Serologic work-up indicative of underlying autoimmune process; concern is Lupus - s/p 3 days steroids (8/12 - 8/14, solumedrol 1g daily) - oncology, nephrology, and rheumatology following - tentative plan is renal biopsy as PLTC allows (see thrombocytopenia) - also starting Rituxan on 8/19 while still coordinating renal bx in background (have been limited by thrombocytopenia)  Thrombocytopenia ([place]) - also  see pancytopenia below - PLTC down to 21k on 8/17; no overt bleeding but in efforts to facilitate renal bx will transfuse 2 packs platelets  - PLTC only up to 32k on 8/18; some oozing as expected around trialysis cath; repeat 1 unit Platelet on 8/18 - CBC in am  AKI (acute kidney injury) ([place]) - baseline creat ~0.7 and renal function has continued to deteriorate - nephrology following as well, greatly appreciate assistance - s/p femoral trialysis on 8/17; started 1st HD session on 8/17 as well - continue HD per nephrology  Controlled type 2 diabetes mellitus without complication, without long-term current use of insulin (HCC) - A1c 6.5% on 10/02/20 - s/p steroids and sounds like CBGs elevated at that time  - remains on SSI as well as prandial coverage and basal insulin - she is at risk for hypoglycemia with that regimen (as has already happened) - now that steroids are complete, will decrease her insulin regimen  - d/c prandial and Semglee - use SSI only for now  Oral thrush Patient with clinical indications of thrush Associated with difficulty swallowing Patient states poor p.o. intake is driven by appetite rather than pain --s/p Diflucan for 3 doses  Pancytopenia ([place]) - history of CLL as well All 3 cell lines acutely depressed Oncology consulted Patient's CLL has been in remission since 2016 Recurrence of hematologic note malignancy felt unlikely --s/p 1u PRBC 8/5 for Hgb 7.1 and 1 unit on 8/10 - s/p 1u PRBC 8/18  FUO (fever of unknown origin) - fevers seem to have defervesced since 8/12 - ID has followed and signed off on 8/16 (see their note as well) - no further workup at this time   Hypokalemia - Replete and recheck as needed  Hypomagnesemia - Replete  and recheck as needed  Acute metabolic encephalopathy Unclear etiology --meningitis ruled out by LP.  CT head neg for new stroke.  Hematology ruled out CLL as the culprit.  No seizure on EEG. --mental status  dramatically improved with treatment of pancreatitis and IV thiamine repletion. --MRI brain, no acute finding -- Completed high-dose IV thiamine x3 days with good result  Hyperlipidemia, mixed - Continue statin  Essential hypertension - Continue Cardizem and hydralazine  Hypothyroidism - Continue Synthroid  Pancreatitis-resolved as of 12/11/2020 Unclear etiology No reported history of alcohol intake, no radiographic evidence of gallstones Possible drug-induced Initial abdominal examination negative for tenderness Lipase trending down with IVF, but much slowly than usual. Triglycerides negative - now resolved    Old records reviewed in assessment of this patient  Antimicrobials:   DVT prophylaxis: Place TED hose Start: 11/19/20 2301   Code Status:   Code Status: Full Code Family Communication: Husband  Disposition Plan: Status is: Inpatient  Remains inpatient appropriate because:Inpatient level of care appropriate due to severity of illness  Dispo: The patient is from: Home              Anticipated d/c is to: Home              Patient currently is not medically stable to d/c.   Difficult to place patient No  Risk of unplanned readmission score: Unplanned Admission- Pilot do not use: 50.52   Objective: Blood pressure 138/69, pulse (!) 59, temperature 100.1 F (37.8 C), temperature source Oral, resp. rate 17, height 6' (1.829 m), weight 94.4 kg, SpO2 99 %.  Examination: General appearance: alert, cooperative, and no distress Head:  face appears slightly oversized Mouth: no thrush noted; Torus Palatinus noted  Eyes:  EOMI Lungs: clear to auscultation bilaterally Heart: regular rate and rhythm and S1, S2 normal Abdomen: normal findings: bowel sounds normal and soft, non-tender Extremities:  no LE edema; swollen joints noted in ankle, wrist; some edema in fingers Skin:  excess skin noted due to weight loss Neurologic: no focal deficits   Consultants:   Oncology/hematology Rheumatology Nephrology ID  Procedures:    Data Reviewed: I have personally reviewed following labs and imaging studies Results for orders placed or performed during the hospital encounter of 11/19/20 (from the past 24 hour(s))  Glucose, capillary     Status: Abnormal   Collection Time: 12/10/20  9:48 PM  Result Value Ref Range   Glucose-Capillary 127 (H) 70 - 99 mg/dL  CBC with Differential/Platelet     Status: Abnormal   Collection Time: 12/11/20  4:37 AM  Result Value Ref Range   WBC 6.4 4.0 - 10.5 K/uL   RBC 2.45 (L) 3.87 - 5.11 MIL/uL   Hemoglobin 6.7 (L) 12.0 - 15.0 g/dL   HCT 20.1 (L) 36.0 - 46.0 %   MCV 82.0 80.0 - 100.0 fL   MCH 27.3 26.0 - 34.0 pg   MCHC 33.3 30.0 - 36.0 g/dL   RDW 16.8 (H) 11.5 - 15.5 %   Platelets 32 (L) 150 - 400 K/uL   nRBC 0.0 0.0 - 0.2 %   Neutrophils Relative % 74 %   Neutro Abs 4.8 1.7 - 7.7 K/uL   Lymphocytes Relative 19 %   Lymphs Abs 1.2 0.7 - 4.0 K/uL   Monocytes Relative 4 %   Monocytes Absolute 0.3 0.1 - 1.0 K/uL   Eosinophils Relative 2 %   Eosinophils Absolute 0.1 0.0 - 0.5 K/uL   Basophils Relative 0 %  Basophils Absolute 0.0 0.0 - 0.1 K/uL   WBC Morphology MORPHOLOGY UNREMARKABLE    Immature Granulocytes 1 %   Abs Immature Granulocytes 0.09 (H) 0.00 - 0.07 K/uL   Ovalocytes PRESENT   Comprehensive metabolic panel     Status: Abnormal   Collection Time: 12/11/20  4:37 AM  Result Value Ref Range   Sodium 138 135 - 145 mmol/L   Potassium 4.1 3.5 - 5.1 mmol/L   Chloride 104 98 - 111 mmol/L   CO2 26 22 - 32 mmol/L   Glucose, Bld 74 70 - 99 mg/dL   BUN 49 (H) 6 - 20 mg/dL   Creatinine, Ser 3.36 (H) 0.44 - 1.00 mg/dL   Calcium 7.4 (L) 8.9 - 10.3 mg/dL   Total Protein 5.1 (L) 6.5 - 8.1 g/dL   Albumin 2.1 (L) 3.5 - 5.0 g/dL   AST 47 (H) 15 - 41 U/L   ALT 17 0 - 44 U/L   Alkaline Phosphatase 70 38 - 126 U/L   Total Bilirubin 0.8 0.3 - 1.2 mg/dL   GFR, Estimated 15 (L) >60 mL/min   Anion gap 8 5 - 15   Magnesium     Status: None   Collection Time: 12/11/20  4:37 AM  Result Value Ref Range   Magnesium 1.8 1.7 - 2.4 mg/dL  Prepare RBC (crossmatch)     Status: None   Collection Time: 12/11/20  7:20 AM  Result Value Ref Range   Order Confirmation      ORDER PROCESSED BY BLOOD BANK Performed at [facility], [address] [place], [place] [address]   Prepare Pheresed Platelets     Status: None (Preliminary result)   Collection Time: 12/11/20  7:20 AM  Result Value Ref Range   Unit Number [id number]    Blood Component Type PLTP1 PSORALEN TREATED    Unit division 00    Status of Unit ISSUED    Transfusion Status      OK TO TRANSFUSE Performed at [facility], [address] [place], [place] [address]   Type and screen [place]     Status: None (Preliminary result)   Collection Time: 12/11/20  8:03 AM  Result Value Ref Range   ABO/RH(D) O POS    Antibody Screen POS    Sample Expiration 12/14/2020,2359    Unit Number [id number]    Blood Component Type RBC, LR IRR    Unit division 00    Status of Unit ALLOCATED    Transfusion Status OK TO TRANSFUSE    Crossmatch Result COMPATIBLE    Unit Number [id number]    Blood Component Type RBC, LR IRR    Unit division 00    Status of Unit ALLOCATED    Transfusion Status OK TO TRANSFUSE    Crossmatch Result COMPATIBLE   Glucose, capillary     Status: Abnormal   Collection Time: 12/11/20  8:19 AM  Result Value Ref Range   Glucose-Capillary 61 (L) 70 - 99 mg/dL  Glucose, capillary     Status: Abnormal   Collection Time: 12/11/20  9:01 AM  Result Value Ref Range   Glucose-Capillary 145 (H) 70 - 99 mg/dL  Glucose, capillary     Status: None   Collection Time: 12/11/20  1:03 PM  Result Value Ref Range   Glucose-Capillary 97 70 - 99 mg/dL    Recent Results (from the past 240 hour(s))  CULTURE, BLOOD (ROUTINE X 2) w Reflex to ID Panel  Status: None   Collection Time: 12/06/20  10:35 AM   Specimen: BLOOD  Result Value Ref Range Status   Specimen Description BLOOD LT KNUCKLE  Final   Special Requests   Final    BOTTLES DRAWN AEROBIC AND ANAEROBIC Blood Culture adequate volume   Culture   Final    NO GROWTH 5 DAYS Performed at [facility], [address] [place], [place] [address]    Report Status 12/11/2020 FINAL  Final  CULTURE, BLOOD (ROUTINE X 2) w Reflex to ID Panel     Status: None   Collection Time: 12/06/20 10:35 AM   Specimen: BLOOD  Result Value Ref Range Status   Specimen Description BLOOD BLOOD LEFT WRIST  Final   Special Requests AEROBIC BOTTLE ONLY Blood Culture adequate volume  Final   Culture   Final    NO GROWTH 5 DAYS Performed at [facility], [address] [place], [place] [address]    Report Status 12/11/2020 FINAL  Final     Radiology Studies: PERIPHERAL VASCULAR CATHETERIZATION  Result Date: 12/10/2020 See surgical note for result.  US Venous Img Upper Bilat (DVT)  Final Result    US Venous Img Lower Bilateral (DVT)  Final Result    DG Chest 1 View  Final Result    CT BONE MARROW BIOPSY  Final Result    US RENAL  Final Result    MR Lumbar Spine W Wo Contrast  Final Result    MR THORACIC SPINE W WO CONTRAST  Final Result    MR CERVICAL SPINE W WO CONTRAST  Final Result    MR BRAIN W WO CONTRAST  Final Result    DG Chest Port 1 View  Final Result    [place] EKG SITE RITE  Final Result    CT HEAD W & WO CONTRAST  Final Result    CT CHEST ABDOMEN PELVIS W CONTRAST  Final Result    DG FL GUIDED LUMBAR PUNCTURE  Final Result    CT Head Wo Contrast  Final Result    DG Chest Portable 1 View  Final Result    US BIOPSY (KIDNEY)    (Results Pending)    Scheduled Meds:  sodium chloride   Intravenous Once   amLODipine  5 mg Oral Daily   Chlorhexidine Gluconate Cloth  6 each Topical Daily   diltiazem  120 mg Oral Daily   feeding supplement  1 Container Oral TID BM   heparin sodium  (porcine)       insulin aspart  0-15 Units Subcutaneous TID WC   levothyroxine  100 mcg Oral QAC breakfast   magnesium oxide  800 mg Oral BID   mirtazapine  15 mg Oral QHS   multivitamin with minerals  1 tablet Oral Daily   pantoprazole  40 mg Oral Daily   psyllium  1 packet Oral Daily   rosuvastatin  10 mg Oral QHS   sodium bicarbonate  650 mg Oral TID   sodium chloride flush  10-40 mL Intracatheter Q12H   thiamine  100 mg Oral Daily   PRN Meds: acetaminophen **OR** acetaminophen, albuterol, haloperidol lactate, loperamide, ondansetron **OR** ondansetron (ZOFRAN) IV, polyethylene glycol, sodium chloride flush Continuous Infusions:   LOS: 21 days  Time spent: Greater than 50% of the 35 minute visit was spent in counseling/coordination of care for the patient as laid out in the A&P.   Dwyane Dee, MD [facility] 12/11/2020, 1:58 PM

## 2020-12-11 NOTE — Assessment & Plan Note (Addendum)
-   A1c 6.5% on 10/02/20 - s/p steroids and sounds like CBGs elevated at that time  - remains on SSI as well as prandial coverage and basal insulin - she is at risk for hypoglycemia with that regimen (as has already happened) - now that steroids are complete, will decrease her insulin regimen  - d/c prandial and Semglee - use SSI only for now - solumedrol 100 mg given on 8/19 with Rituxan: CBGs elevated since; will treat acutely but CBGs should improve after steroid washes out as noted above

## 2020-12-12 ENCOUNTER — Inpatient Hospital Stay: Payer: Medicare Other

## 2020-12-12 VITALS — BP 134/71 | HR 61 | Temp 99.3°F | Resp 18

## 2020-12-12 DIAGNOSIS — D8989 Other specified disorders involving the immune mechanism, not elsewhere classified: Secondary | ICD-10-CM | POA: Diagnosis not present

## 2020-12-12 DIAGNOSIS — C9110 Chronic lymphocytic leukemia of B-cell type not having achieved remission: Secondary | ICD-10-CM | POA: Insufficient documentation

## 2020-12-12 DIAGNOSIS — Z5111 Encounter for antineoplastic chemotherapy: Secondary | ICD-10-CM | POA: Insufficient documentation

## 2020-12-12 LAB — GLUCOSE, CAPILLARY
Glucose-Capillary: 142 mg/dL — ABNORMAL HIGH (ref 70–99)
Glucose-Capillary: 312 mg/dL — ABNORMAL HIGH (ref 70–99)
Glucose-Capillary: 375 mg/dL — ABNORMAL HIGH (ref 70–99)

## 2020-12-12 LAB — CBC WITH DIFFERENTIAL/PLATELET
Abs Immature Granulocytes: 0.09 10*3/uL — ABNORMAL HIGH (ref 0.00–0.07)
Basophils Absolute: 0 10*3/uL (ref 0.0–0.1)
Basophils Relative: 0 %
Eosinophils Absolute: 0.1 10*3/uL (ref 0.0–0.5)
Eosinophils Relative: 2 %
HCT: 21.9 % — ABNORMAL LOW (ref 36.0–46.0)
Hemoglobin: 7.3 g/dL — ABNORMAL LOW (ref 12.0–15.0)
Immature Granulocytes: 2 %
Lymphocytes Relative: 19 %
Lymphs Abs: 1.1 10*3/uL (ref 0.7–4.0)
MCH: 26.8 pg (ref 26.0–34.0)
MCHC: 33.3 g/dL (ref 30.0–36.0)
MCV: 80.5 fL (ref 80.0–100.0)
Monocytes Absolute: 0.2 10*3/uL (ref 0.1–1.0)
Monocytes Relative: 4 %
Neutro Abs: 4.5 10*3/uL (ref 1.7–7.7)
Neutrophils Relative %: 73 %
Platelets: 30 10*3/uL — ABNORMAL LOW (ref 150–400)
RBC: 2.72 MIL/uL — ABNORMAL LOW (ref 3.87–5.11)
RDW: 17.3 % — ABNORMAL HIGH (ref 11.5–15.5)
WBC: 6.1 10*3/uL (ref 4.0–10.5)
nRBC: 0 % (ref 0.0–0.2)

## 2020-12-12 LAB — COMPREHENSIVE METABOLIC PANEL
ALT: 26 U/L (ref 0–44)
AST: 73 U/L — ABNORMAL HIGH (ref 15–41)
Albumin: 2.1 g/dL — ABNORMAL LOW (ref 3.5–5.0)
Alkaline Phosphatase: 78 U/L (ref 38–126)
Anion gap: 4 — ABNORMAL LOW (ref 5–15)
BUN: 32 mg/dL — ABNORMAL HIGH (ref 6–20)
CO2: 29 mmol/L (ref 22–32)
Calcium: 7.3 mg/dL — ABNORMAL LOW (ref 8.9–10.3)
Chloride: 102 mmol/L (ref 98–111)
Creatinine, Ser: 2.8 mg/dL — ABNORMAL HIGH (ref 0.44–1.00)
GFR, Estimated: 19 mL/min — ABNORMAL LOW (ref >60)
Glucose, Bld: 167 mg/dL — ABNORMAL HIGH (ref 70–99)
Potassium: 3.8 mmol/L (ref 3.5–5.1)
Sodium: 135 mmol/L (ref 135–145)
Total Bilirubin: 0.9 mg/dL (ref 0.3–1.2)
Total Protein: 4.9 g/dL — ABNORMAL LOW (ref 6.5–8.1)

## 2020-12-12 LAB — PREPARE PLATELET PHERESIS: Unit division: 0

## 2020-12-12 LAB — BPAM PLATELET PHERESIS
Blood Product Expiration Date: 202208202359
ISSUE DATE / TIME: 202208180954
Unit Type and Rh: 6200

## 2020-12-12 LAB — MAGNESIUM: Magnesium: 1.8 mg/dL (ref 1.7–2.4)

## 2020-12-12 MED ORDER — SODIUM CHLORIDE 0.9 % IV SOLN
1000.0000 mg | Freq: Once | INTRAVENOUS | Status: AC
  Administered 2020-12-12: 1000 mg via INTRAVENOUS
  Filled 2020-12-12: qty 100

## 2020-12-12 MED ORDER — ACETAMINOPHEN 325 MG PO TABS
650.0000 mg | ORAL_TABLET | Freq: Once | ORAL | Status: AC
  Administered 2020-12-12: 650 mg via ORAL
  Filled 2020-12-12: qty 2

## 2020-12-12 MED ORDER — DIPHENHYDRAMINE HCL 50 MG/ML IJ SOLN
50.0000 mg | Freq: Once | INTRAMUSCULAR | Status: AC
  Administered 2020-12-12: 50 mg via INTRAVENOUS
  Filled 2020-12-12: qty 1

## 2020-12-12 MED ORDER — INSULIN ASPART 100 UNIT/ML IJ SOLN
0.0000 [IU] | Freq: Three times a day (TID) | INTRAMUSCULAR | Status: DC
  Administered 2020-12-13: 17:00:00 8 [IU] via SUBCUTANEOUS
  Administered 2020-12-13: 15 [IU] via SUBCUTANEOUS
  Administered 2020-12-14: 17:00:00 5 [IU] via SUBCUTANEOUS
  Administered 2020-12-14 – 2020-12-15 (×3): 8 [IU] via SUBCUTANEOUS
  Administered 2020-12-15 – 2020-12-17 (×4): 2 [IU] via SUBCUTANEOUS
  Administered 2020-12-17: 5 [IU] via SUBCUTANEOUS
  Administered 2020-12-17: 3 [IU] via SUBCUTANEOUS
  Administered 2020-12-18: 08:00:00 2 [IU] via SUBCUTANEOUS
  Administered 2020-12-18: 11 [IU] via SUBCUTANEOUS
  Administered 2020-12-18: 18:00:00 3 [IU] via SUBCUTANEOUS
  Administered 2020-12-19 (×2): 15 [IU] via SUBCUTANEOUS
  Administered 2020-12-19: 3 [IU] via SUBCUTANEOUS
  Administered 2020-12-20: 5 [IU] via SUBCUTANEOUS
  Administered 2020-12-20: 8 [IU] via SUBCUTANEOUS
  Administered 2020-12-20 – 2020-12-21 (×2): 15 [IU] via SUBCUTANEOUS
  Administered 2020-12-21: 10:00:00 11 [IU] via SUBCUTANEOUS
  Administered 2020-12-21: 15 [IU] via SUBCUTANEOUS
  Administered 2020-12-21: 10 [IU] via SUBCUTANEOUS
  Administered 2020-12-22: 11 [IU] via SUBCUTANEOUS
  Administered 2020-12-22: 8 [IU] via SUBCUTANEOUS
  Administered 2020-12-22: 12:00:00 15 [IU] via SUBCUTANEOUS
  Filled 2020-12-12 (×28): qty 1

## 2020-12-12 MED ORDER — OXYCODONE HCL 5 MG PO TABS: 5.0000 mg | ORAL_TABLET | ORAL | Status: DC | PRN

## 2020-12-12 MED ORDER — HEPARIN SOD (PORK) LOCK FLUSH 100 UNIT/ML IV SOLN
INTRAVENOUS | Status: AC
  Administered 2020-12-12: 250 [IU]
  Filled 2020-12-12: qty 5

## 2020-12-12 MED ORDER — SODIUM CHLORIDE 0.9% FLUSH
10.0000 mL | Freq: Once | INTRAVENOUS | Status: AC | PRN
  Administered 2020-12-12: 10 mL
  Filled 2020-12-12: qty 10

## 2020-12-12 MED ORDER — INSULIN ASPART 100 UNIT/ML IJ SOLN
0.0000 [IU] | Freq: Every day | INTRAMUSCULAR | Status: DC
  Administered 2020-12-12: 5 [IU] via SUBCUTANEOUS
  Administered 2020-12-13 – 2020-12-17 (×3): 2 [IU] via SUBCUTANEOUS
  Administered 2020-12-19: 5 [IU] via SUBCUTANEOUS
  Administered 2020-12-20 – 2020-12-21 (×2): 4 [IU] via SUBCUTANEOUS
  Filled 2020-12-12 (×7): qty 1

## 2020-12-12 MED ORDER — FAMOTIDINE 20 MG IN NS 100 ML IVPB
20.0000 mg | Freq: Once | INTRAVENOUS | Status: AC
  Administered 2020-12-12: 20 mg via INTRAVENOUS
  Filled 2020-12-12: qty 20

## 2020-12-12 MED ORDER — HEPARIN SODIUM (PORCINE) 1000 UNIT/ML IJ SOLN
INTRAMUSCULAR | Status: AC
  Filled 2020-12-12: qty 1

## 2020-12-12 MED ORDER — SODIUM CHLORIDE 0.9 % IV SOLN
Freq: Once | INTRAVENOUS | Status: AC
  Filled 2020-12-12: qty 250

## 2020-12-12 MED ORDER — SODIUM CHLORIDE 0.9% FLUSH
10.0000 mL | INTRAVENOUS | Status: AC | PRN
  Administered 2020-12-12: 10 mL
  Filled 2020-12-12: qty 10

## 2020-12-12 MED ORDER — HEPARIN SOD (PORK) LOCK FLUSH 100 UNIT/ML IV SOLN
250.0000 [IU] | INTRAVENOUS | Status: AC | PRN
  Filled 2020-12-12: qty 5

## 2020-12-12 MED ORDER — METHYLPREDNISOLONE SODIUM SUCC 125 MG IJ SOLR
100.0000 mg | Freq: Once | INTRAMUSCULAR | Status: AC
  Administered 2020-12-12: 100 mg via INTRAVENOUS
  Filled 2020-12-12: qty 2

## 2020-12-12 NOTE — Progress Notes (Signed)
Patient tolerated tx well, she was able to pull her goal of 0.5kg cvc dressing was changed due to blood present. Patient was transferred to the infusion center post treatment.

## 2020-12-12 NOTE — Patient Instructions (Signed)
[facility] ONCOLOG[facility]   Discharge Instructions: Thank you for choosing [facility] to provide your oncology and hematology care.  If you have a lab appointment with the [facility], please go directly to the [facility] and check in at the registration area.  Wear comfortable clothing and clothing appropriate for easy access to any Portacath or PICC line.   We strive to give you quality time with your provider. You may need to reschedule your appointment if you arrive late (15 or more minutes).  Arriving late affects you and other patients whose appointments are after yours.  Also, if you miss three or more appointments without notifying the office, you may be dismissed from the clinic at the provider's discretion.      For prescription refill requests, have your pharmacy contact our office and allow 72 hours for refills to be completed.    Today you received the following chemotherapy and/or immunotherapy agents: Rituximab-abbs (Truxima).      To help prevent nausea and vomiting after your treatment, we encourage you to take your nausea medication as directed.  BELOW ARE SYMPTOMS THAT SHOULD BE REPORTED IMMEDIATELY: *FEVER GREATER THAN 100.4 F (38 C) OR HIGHER *CHILLS OR SWEATING *NAUSEA AND VOMITING THAT IS NOT CONTROLLED WITH YOUR NAUSEA MEDICATION *UNUSUAL SHORTNESS OF BREATH *UNUSUAL BRUISING OR BLEEDING *URINARY PROBLEMS (pain or burning when urinating, or frequent urination) *BOWEL PROBLEMS (unusual diarrhea, constipation, pain near the anus) TENDERNESS IN MOUTH AND THROAT WITH OR WITHOUT PRESENCE OF ULCERS (sore throat, sores in mouth, or a toothache) UNUSUAL RASH, SWELLING OR PAIN  UNUSUAL VAGINAL DISCHARGE OR ITCHING   Items with * indicate a potential emergency and should be followed up as soon as possible or go to the Emergency Department if any problems should occur.  Please show the CHEMOTHERAPY ALERT CARD or IMMUNOTHERAPY ALERT  CARD at check-in to the Emergency Department and triage nurse.  Should you have questions after your visit or need to cancel or reschedule your appointment, please contact [facility]  [phone] and follow the prompts.  Office hours are 8:00 a.m. to 4:30 p.m. Monday - Friday. Please note that voicemails left after 4:00 p.m. may not be returned until the following business day.  We are closed weekends and major holidays. You have access to a nurse at all times for urgent questions. Please call the main number to the clinic [phone] and follow the prompts.  For any non-urgent questions, you may also contact your provider using MyChart. We now offer e-Visits for anyone 38 and older to request care online for non-urgent symptoms. For details visit [email or web address].   Also download the MyChart app! Go to the app store, search "MyChart", open the app, select [facility], and log in with your MyChart username and password.  Due to Covid, a mask is required upon entering the hospital/clinic. If you do not have a mask, one will be given to you upon arrival. For doctor visits, patients may have 1 support person aged 40 or older with them. For treatment visits, patients cannot have anyone with them due to current Covid guidelines and our immunocompromised population.

## 2020-12-12 NOTE — Progress Notes (Signed)
1115- Patient is currently admitted on inpatient unit 1C, room 102. Patient arrived to the cancer center this morning from dialysis unit. Patient and vital signs stable. Patient is here to receive Rituximab-abbs (Truxima) treatment.   1725- Patient tolerated Rituximab-abbs (Truxima) treatment well. Patient and vital signs stable. Verbal hand-off report given to Doreen Beam, RN, on inpatient unit 1C.   1805- Cancer center infusion staff are transporting patient back to inpatient unit 1C, room 102, via bed.

## 2020-12-12 NOTE — Progress Notes (Signed)
OT Cancellation Note  Patient Details Name: Tonya Myers MRN: [id number] DOB: January 27, 1966   Cancelled Treatment:    Reason Eval/Treat Not Completed: Patient at procedure or test/ unavailable. Per chart review pt off the unit for HD this AM. Will continue to follow per POC as pt available.   Dessie Coma, M.S. OTR/L  12/12/20, 8:14 AM  ascom [phone]

## 2020-12-12 NOTE — Progress Notes (Signed)
[facility]  ROUNDING NOTE   Subjective:   Tonya Myers is a 55 y.o. female with medical conditions including pneumococcal meningitis bacteremia, hypothyroidism, Type 2 diabetes, and lymphoma in remission. She presents to the ED with confusion and is admitted for Altered mental status [R41.82] Encephalopathy acute [G93.40] Altered mental status, unspecified altered mental status type [R41.82] Sepsis with encephalopathy without septic shock, due to unspecified organism ([place]) [A41.9, R65.20, G93.40]  Patient seen during dialysis  HEMODIALYSIS FLOWSHEET:  Blood Flow Rate (mL/min): 300 mL/min Arterial Pressure (mmHg): -130 mmHg Venous Pressure (mmHg): 90 mmHg Transmembrane Pressure (mmHg): 60 mmHg Ultrafiltration Rate (mL/min): 510 mL/min Dialysate Flow Rate (mL/min): 500 ml/min Conductivity: Machine : 13.8 Conductivity: Machine : 13.8 Dialysis Fluid Bolus: Normal Saline Bolus Amount (mL): 300 mL  Patient alert and oriented No complaints at this time Tolerating treatment   Objective:  Vital signs in last 24 hours:  Temp:  [98.8 F (37.1 C)-101.4 F (38.6 C)] 99.6 F (37.6 C) (08/19 1338) Pulse Rate:  [62-87] 68 (08/19 1338) Resp:  [13-22] 16 (08/19 1338) BP: (116-145)/(55-86) 134/78 (08/19 1338) SpO2:  [95 %-100 %] 97 % (08/19 1338)  Weight change:  Filed Weights   12/01/20 0920 12/01/20 1730 12/10/20 1345  Weight: 94.9 kg 94.4 kg 94.4 kg    Intake/Output: I/O last 3 completed shifts: In: 928 [Blood:928] Out: 557 [Urine:851; Stool:2]   Intake/Output this shift:  Total I/O In: 194.9 [I.V.:84.4; IV Piggyback:110.4] Out: 500 [Other:500]  Physical Exam: General: NAD, laying in bed  Head: Normocephalic, atraumatic. Moist oral mucosal membranes  Eyes: Anicteric  Lungs:  Clear to auscultation, normal effort  Heart: Regular rate and rhythm  Abdomen:  Soft, nontender  Extremities: trace peripheral edema.  Neurologic: Alert, moving all four  extremities  Skin: No lesions  Access RUE femoral temp cath placed by Schnier    Basic Metabolic Panel: Recent Labs  Lab 12/08/20 0719 12/09/20 0512 12/10/20 0508 12/11/20 0437 12/12/20 0510  NA 136 136 137 138 135  K 4.6 4.7 4.7 4.1 3.8  CL 110 109 109 104 102  CO2 21* 21* 21* 26 29  GLUCOSE 394* 257* 132* 74 167*  BUN 56* 67* 75* 49* 32*  CREATININE 3.72* 4.10* 4.42* 3.36* 2.80*  CALCIUM 7.9* 7.9* 8.3* 7.4* 7.3*  MG 2.0  --   --  1.8 1.8     Liver Function Tests: Recent Labs  Lab 12/09/20 1020 12/11/20 0437 12/12/20 0510  AST 37 47* 73*  ALT 9 17 26   ALKPHOS 72 70 78  BILITOT 0.5 0.8 0.9  PROT 5.5* 5.1* 4.9*  ALBUMIN 2.2* 2.1* 2.1*    Recent Labs  Lab 12/06/20 1035 12/07/20 0511  LIPASE 90* 135*    No results for input(s): AMMONIA in the last 168 hours.  CBC: Recent Labs  Lab 12/08/20 0719 12/09/20 0512 12/10/20 0508 12/11/20 0437 12/11/20 1835 12/12/20 0510  WBC 7.6 8.9 8.0 6.4 6.3 6.1  NEUTROABS 6.5 7.0 5.7 4.8  --  4.5  HGB 7.4* 7.4* 7.4* 6.7* 7.7* 7.3*  HCT 22.4* 21.9* 22.3* 20.1* 23.0* 21.9*  MCV 81.2 80.5 80.8 82.0 81.3 80.5  PLT 30* 28* 21* 32* 36* 30*     Cardiac Enzymes: No results for input(s): CKTOTAL, CKMB, CKMBINDEX, TROPONINI in the last 168 hours.   BNP: Invalid input(s): POCBNP  CBG: Recent Labs  Lab 12/11/20 0901 12/11/20 1303 12/11/20 1719 12/11/20 2140 12/12/20 0752  GLUCAP 145* 97 186* 207* 142*     Microbiology: Results  for orders placed or performed during the hospital encounter of 11/19/20  Blood culture (routine x 2)     Status: None   Collection Time: 11/19/20  4:47 PM   Specimen: BLOOD  Result Value Ref Range Status   Specimen Description BLOOD RIGHT ANTECUBITAL  Final   Special Requests   Final    BOTTLES DRAWN AEROBIC AND ANAEROBIC Blood Culture adequate volume   Culture   Final    NO GROWTH 5 DAYS Performed at [facility], [address] [place], [place] [address]    Report  Status 11/24/2020 FINAL  Final  Resp Panel by RT-PCR (Flu A&B, Covid) Nasopharyngeal Swab     Status: None   Collection Time: 11/19/20  4:48 PM   Specimen: Nasopharyngeal Swab; Nasopharyngeal(NP) swabs in vial transport medium  Result Value Ref Range Status   SARS Coronavirus 2 by RT PCR NEGATIVE NEGATIVE Final    Comment: (NOTE) SARS-CoV-2 target nucleic acids are NOT DETECTED.  The SARS-CoV-2 RNA is generally detectable in upper respiratory specimens during the acute phase of infection. The lowest concentration of SARS-CoV-2 viral copies this assay can detect is 138 copies/mL. A negative result does not preclude SARS-Cov-2 infection and should not be used as the sole basis for treatment or other patient management decisions. A negative result may occur with  improper specimen collection/handling, submission of specimen other than nasopharyngeal swab, presence of viral mutation(s) within the areas targeted by this assay, and inadequate number of viral copies(<138 copies/mL). A negative result must be combined with clinical observations, patient history, and epidemiological information. The expected result is Negative.  Fact Sheet for Patients:  EntrepreneurPulse.com.au  Fact Sheet for Healthcare Providers:  IncredibleEmployment.be  This test is no t yet approved or cleared by the [place] FDA and  has been authorized for detection and/or diagnosis of SARS-CoV-2 by FDA under an Emergency Use Authorization (EUA). This EUA will remain  in effect (meaning this test can be used) for the duration of the COVID-19 declaration under Section 564(b)(1) of the Act, 21 U.S.C.section 360bbb-3(b)(1), unless the authorization is terminated  or revoked sooner.       Influenza A by PCR NEGATIVE NEGATIVE Final   Influenza B by PCR NEGATIVE NEGATIVE Final    Comment: (NOTE) The Xpert Xpress SARS-CoV-2/FLU/RSV plus assay is intended as an aid in the  diagnosis of influenza from Nasopharyngeal swab specimens and should not be used as a sole basis for treatment. Nasal washings and aspirates are unacceptable for Xpert Xpress SARS-CoV-2/FLU/RSV testing.  Fact Sheet for Patients: EntrepreneurPulse.com.au  Fact Sheet for Healthcare Providers: IncredibleEmployment.be  This test is not yet approved or cleared by the [place] FDA and has been authorized for detection and/or diagnosis of SARS-CoV-2 by FDA under an Emergency Use Authorization (EUA). This EUA will remain in effect (meaning this test can be used) for the duration of the COVID-19 declaration under Section 564(b)(1) of the Act, 21 U.S.C. section 360bbb-3(b)(1), unless the authorization is terminated or revoked.  Performed at [facility], [address] [place], [place] [address]   Blood culture (routine x 2)     Status: None   Collection Time: 11/19/20  6:46 PM   Specimen: BLOOD  Result Value Ref Range Status   Specimen Description BLOOD RIGHT ANTECUBITAL  Final   Special Requests   Final    BOTTLES DRAWN AEROBIC AND ANAEROBIC Blood Culture adequate volume   Culture   Final    NO GROWTH 5 DAYS  Performed at [facility], [address], [place], [place] [address]    Report Status 11/24/2020 FINAL  Final  Urine Culture     Status: Abnormal   Collection Time: 11/19/20  9:33 PM   Specimen: Urine, Clean Catch  Result Value Ref Range Status   Specimen Description   Final    URINE, CLEAN CATCH Performed at [facility], [address], [place], [place] [address]    Special Requests   Final    NONE Performed at [facility], [address], [place], [place] [address]    Culture (A)  Final    <10,000 COLONIES/mL INSIGNIFICANT GROWTH Performed at [place] [address], [place], [place] [address]    Report Status 11/21/2020 FINAL  Final  Culture, fungus without smear      Status: None   Collection Time: 11/20/20 11:09 AM   Specimen: CSF; Cerebrospinal Fluid  Result Value Ref Range Status   Specimen Description   Final    CSF Performed at [facility], [address], [place], [place] [address]    Special Requests   Final    NONE Performed at [facility], [address] [place], [place] [address]    Culture   Final    NO FUNGUS ISOLATED AFTER 21 DAYS Performed at [facility], [place] [address], [place], [place] [address]    Report Status 12/11/2020 FINAL  Final  CSF culture w Gram Stain     Status: None   Collection Time: 11/20/20 11:09 AM   Specimen: PATH Cytology CSF; Cerebrospinal Fluid  Result Value Ref Range Status   Specimen Description   Final    CSF Performed at [facility], [address], [place], [place] [address]    Special Requests   Final    NONE Performed at [facility], [address] [place], [place] [address]    Gram Stain   Final    NO ORGANISMS SEEN WBC SEEN RED BLOOD CELLS PRESENT Performed at [facility], [address], [place], [place] [address]    Culture   Final    NO GROWTH 3 DAYS Performed at [facility], [place] [address], [place], [place] [address]    Report Status 11/23/2020 FINAL  Final  Fungus Culture With Stain     Status: None (Preliminary result)   Collection Time: 11/20/20 11:09 AM   Specimen: PATH Cytology CSF; Cerebrospinal Fluid  Result Value Ref Range Status   Fungus Stain Final report  Final    Comment: (NOTE) Performed At: [facility] [place], [place] [id number] Rush Farmer MD [id number]    Fungus (Mycology) Culture PENDING  Incomplete   Fungal Source CSF  Final    Comment: Performed at [facility], [address] [place],  [address]  Fungus Culture Result     Status: None   Collection Time: 11/20/20 11:09 AM  Result Value Ref Range Status   Result 1 Comment  Final    Comment:  (NOTE) KOH/Calcofluor preparation:  no fungus observed. Performed At: [facility] [place], [place] [id number] Rush Farmer MD [id number]   Resp Panel by RT-PCR (Flu A&B, Covid) Nasopharyngeal Swab     Status: None   Collection Time: 11/27/20  1:39 PM   Specimen: Nasopharyngeal Swab; Nasopharyngeal(NP) swabs in vial transport medium  Result Value Ref Range Status   SARS Coronavirus 2 by RT PCR  NEGATIVE NEGATIVE Final    Comment: (NOTE) SARS-CoV-2 target nucleic acids are NOT DETECTED.  The SARS-CoV-2 RNA is generally detectable in upper respiratory specimens during the acute phase of infection. The lowest concentration of SARS-CoV-2 viral copies this assay can detect is 138 copies/mL. A negative result does not preclude SARS-Cov-2 infection and should not be used as the sole basis for treatment or other patient management decisions. A negative result may occur with  improper specimen collection/handling, submission of specimen other than nasopharyngeal swab, presence of viral mutation(s) within the areas targeted by this assay, and inadequate number of viral copies(<138 copies/mL). A negative result must be combined with clinical observations, patient history, and epidemiological information. The expected result is Negative.  Fact Sheet for Patients:  EntrepreneurPulse.com.au  Fact Sheet for Healthcare Providers:  IncredibleEmployment.be  This test is no t yet approved or cleared by the [place] FDA and  has been authorized for detection and/or diagnosis of SARS-CoV-2 by FDA under an Emergency Use Authorization (EUA). This EUA will remain  in effect (meaning this test can be used) for the duration of the COVID-19 declaration under Section 564(b)(1) of the Act, 21 U.S.C.section 360bbb-3(b)(1), unless the authorization is terminated  or revoked sooner.       Influenza A by PCR NEGATIVE NEGATIVE Final    Influenza B by PCR NEGATIVE NEGATIVE Final    Comment: (NOTE) The Xpert Xpress SARS-CoV-2/FLU/RSV plus assay is intended as an aid in the diagnosis of influenza from Nasopharyngeal swab specimens and should not be used as a sole basis for treatment. Nasal washings and aspirates are unacceptable for Xpert Xpress SARS-CoV-2/FLU/RSV testing.  Fact Sheet for Patients: EntrepreneurPulse.com.au  Fact Sheet for Healthcare Providers: IncredibleEmployment.be  This test is not yet approved or cleared by the [place] FDA and has been authorized for detection and/or diagnosis of SARS-CoV-2 by FDA under an Emergency Use Authorization (EUA). This EUA will remain in effect (meaning this test can be used) for the duration of the COVID-19 declaration under Section 564(b)(1) of the Act, 21 U.S.C. section 360bbb-3(b)(1), unless the authorization is terminated or revoked.  Performed at [facility], [address] [place], [place] [address]   Urine Culture     Status: Abnormal   Collection Time: 11/27/20  2:15 PM   Specimen: Urine, Clean Catch  Result Value Ref Range Status   Specimen Description   Final    URINE, CLEAN CATCH Performed at [facility], [address], [place], [place] [address]    Special Requests   Final    NONE Performed at [facility], [place], [place] [address]    Culture 20,000 COLONIES/mL ENTEROBACTER AEROGENES (A)  Final   Report Status 11/30/2020 FINAL  Final   Organism ID, Bacteria ENTEROBACTER AEROGENES (A)  Final      Susceptibility   Enterobacter aerogenes - MIC*    CEFAZOLIN >=64 RESISTANT Resistant     CEFEPIME 1 SENSITIVE Sensitive     CEFTRIAXONE >=64 RESISTANT Resistant     CIPROFLOXACIN <=0.25 SENSITIVE Sensitive     GENTAMICIN <=1 SENSITIVE Sensitive     IMIPENEM <=0.25 SENSITIVE Sensitive     NITROFURANTOIN 64 INTERMEDIATE Intermediate     TRIMETH/SULFA <=20  SENSITIVE Sensitive     PIP/TAZO >=128 RESISTANT Resistant     * 20,000 COLONIES/mL ENTEROBACTER AEROGENES  CULTURE, BLOOD (ROUTINE X 2) w Reflex to ID Panel     Status: None   Collection Time: 11/27/20  8:23  PM   Specimen: BLOOD  Result Value Ref Range Status   Specimen Description BLOOD LAC  Final   Special Requests   Final    BOTTLES DRAWN AEROBIC AND ANAEROBIC Blood Culture results may not be optimal due to an inadequate volume of blood received in culture bottles   Culture   Final    NO GROWTH 5 DAYS Performed at [facility], [address] [place], [place] [address]    Report Status 12/02/2020 FINAL  Final  CULTURE, BLOOD (ROUTINE X 2) w Reflex to ID Panel     Status: None   Collection Time: 11/27/20  9:32 PM   Specimen: BLOOD LEFT HAND  Result Value Ref Range Status   Specimen Description BLOOD LEFT HAND  Final   Special Requests   Final    BOTTLES DRAWN AEROBIC AND ANAEROBIC Blood Culture adequate volume   Culture   Final    NO GROWTH 5 DAYS Performed at [facility], [address] [place], [place] [address]    Report Status 12/02/2020 FINAL  Final  CULTURE, BLOOD (ROUTINE X 2) w Reflex to ID Panel     Status: None   Collection Time: 12/06/20 10:35 AM   Specimen: BLOOD  Result Value Ref Range Status   Specimen Description BLOOD LT KNUCKLE  Final   Special Requests   Final    BOTTLES DRAWN AEROBIC AND ANAEROBIC Blood Culture adequate volume   Culture   Final    NO GROWTH 5 DAYS Performed at [facility], [address] [place], [place] [address]    Report Status 12/11/2020 FINAL  Final  CULTURE, BLOOD (ROUTINE X 2) w Reflex to ID Panel     Status: None   Collection Time: 12/06/20 10:35 AM   Specimen: BLOOD  Result Value Ref Range Status   Specimen Description BLOOD BLOOD LEFT WRIST  Final   Special Requests AEROBIC BOTTLE ONLY Blood Culture adequate volume  Final   Culture   Final    NO GROWTH 5 DAYS Performed at [facility], [address] [place],  [address]    Report Status 12/11/2020 FINAL  Final    Coagulation Studies: No results for input(s): LABPROT, INR in the last 72 hours.   Urinalysis: No results for input(s): COLORURINE, LABSPEC, PHURINE, GLUCOSEU, HGBUR, BILIRUBINUR, KETONESUR, PROTEINUR, UROBILINOGEN, NITRITE, LEUKOCYTESUR in the last 72 hours.  Invalid input(s): APPERANCEUR     Imaging: PERIPHERAL VASCULAR CATHETERIZATION  Result Date: 12/10/2020 See surgical note for result.    Medications:       sodium chloride   Intravenous Once   amLODipine  5 mg Oral Daily   Chlorhexidine Gluconate Cloth  6 each Topical Daily   diltiazem  120 mg Oral Daily   feeding supplement  1 Container Oral TID BM   heparin sodium (porcine)       insulin aspart  0-15 Units Subcutaneous TID WC   levothyroxine  100 mcg Oral QAC breakfast   magnesium oxide  800 mg Oral BID   mirtazapine  15 mg Oral QHS   multivitamin with minerals  1 tablet Oral Daily   pantoprazole  40 mg Oral Daily   pneumococcal 13-valent conjugate vaccine  0.5 mL Intramuscular Tomorrow-1000   psyllium  1 packet Oral Daily   rosuvastatin  10 mg Oral QHS   sodium bicarbonate  650 mg Oral TID   sodium chloride flush  10-40 mL Intracatheter Q12H   thiamine  100 mg  Oral Daily   acetaminophen **OR** acetaminophen, albuterol, haloperidol lactate, loperamide, ondansetron **OR** ondansetron (ZOFRAN) IV, polyethylene glycol, sodium chloride flush  Assessment/ Plan:  [name] is a 55 y.o.  female with medical conditions including pneumococcal meningitis bacteremia, hypothyroidism, Type 2 diabetes, and lymphoma in remission. She presents to the ED with confusion and is admitted for Altered mental status [R41.82] Encephalopathy acute [G93.40] Altered mental status, unspecified altered mental status type [R41.82] Sepsis with encephalopathy without septic shock, due to unspecified organism ([place]) [A41.9,  R65.20, G93.40]   Acute Kidney Injury/microscopic hematuria/ proteinuria with baseline creatinine 0.49 on 11/22/20.  Acute kidney injury secondary to contrast induced ATN. IV contrast exposure on November 21, 2020.  UA shows protienuria, >50 RBCs, 21-50 WBC, microbiology 20k colonies- Enterobacter. Renal ultrasound negative for mass and obstruction. Questionable small urterocele. No acute need for dialysis at this time Complements decreased and ANCA positive. Due to conflicting results, will retest for ANCA and other labs to verify results. Labs pending Radiologist unwilling to proceed with renal biopsy with increased bleeding risk. After discussing with team, decision was made to proceed with rituxan empirically until renal biopsy can be performed. Continue platelet transfusions.  - Scheduled to received first rituxan treatment today after dialysis. Appreciate Oncology recommendations and management.  - Received short dialysis treatment today with UF 542m removed. Will allow to rest over weekend and evaluate need for next treatment on Monday.  - Will continue to monitor closely  Lab Results  Component Value Date   CREATININE 2.80 (H) 12/12/2020   CREATININE 3.36 (H) 12/11/2020   CREATININE 4.42 (H) 12/10/2020    Intake/Output Summary (Last 24 hours) at 12/12/2020 1402 Last data filed at 12/12/2020 1223 Gross per 24 hour  Intake 524.86 ml  Output 553 ml  Net -28.14 ml    2. Anemia  with history of CLL pancytopenia Lab Results  Component Value Date   HGB 7.3 (L) 12/12/2020   Bone marrow biopsy 12/04/20, limited marrow material with trilineage hematopoiesis. Recommending repeat bone marrow biopsy Oncology following    LOS: 2[place]8/19/20222:02 PM

## 2020-12-12 NOTE — Progress Notes (Signed)
Tx initiated without complication, patient uf goal is 0.5kg and will run a duration of 2 hours.

## 2020-12-12 NOTE — Progress Notes (Signed)
[place]  [phone] [phone]  ID: Kandy Garrison OB: 04-18-1966  MR#: [id number]  GYB#:[id number]  Patient Care Team: [name], MD as PCP - General (Family Medicine)  CHIEF COMPLAINT: Pancytopenia, acute renal failure, possible vasculitis/lupus.  INTERVAL HISTORY: Patient seen and evaluated in dialysis.  More alert and conversive today.  Risk too high for kidney biopsy, therefore will proceed with empiric Rituxan for possible underlying lupus/vasculitis.  Patient continues to have significant weakness and fatigue, but offers no further complaints.  REVIEW OF SYSTEMS:   Review of Systems  Constitutional:  Positive for malaise/fatigue. Negative for fever and weight loss.  Respiratory: Negative.  Negative for cough, hemoptysis and shortness of breath.   Cardiovascular: Negative.  Negative for chest pain and leg swelling.  Gastrointestinal: Negative.  Negative for abdominal pain.  Genitourinary: Negative.  Negative for dysuria.  Musculoskeletal: Negative.  Negative for back pain.  Skin: Negative.  Negative for rash.  Neurological:  Positive for weakness. Negative for dizziness, focal weakness and headaches.  Endo/Heme/Allergies:  Does not bruise/bleed easily.  Psychiatric/Behavioral:  Positive for memory loss. The patient is not nervous/anxious.     PAST MEDICAL HISTORY: Past Medical History:  Diagnosis Date   Acute hemorrhoid 03/11/2015   Anxiety    Arthritis    Asthma    Cancer ([place])    Chronic back pain    CLL (chronic lymphocytic leukemia) ([place])    Depression    Diabetes mellitus without complication ([place])    Genital herpes    type 2   Hypertension    Hypothyroidism    Vertigo     PAST SURGICAL HISTORY: Past Surgical History:  Procedure Laterality Date   ABDOMINAL HYSTERECTOMY     BILATERAL SALPINGOOPHORECTOMY  2009   BREAST BIOPSY Right 05/17/2016   FIBROADENOMATOUS CHANGE AND SCLEROSING ADENOSIS WITH    COLONOSCOPY WITH PROPOFOL N/A 06/16/2015   Procedure: COLONOSCOPY WITH PROPOFOL;  Surgeon: [name], MD;  Location: [facility] ENDOSCOPY;  Service: Endoscopy;  Laterality: N/A;   LAPAROSCOPIC SUPRACERVICAL HYSTERECTOMY  2009   due to [place] N/A 12/10/2020   Procedure: TEMPORARY DIALYSIS CATHETER;  Surgeon: Katha Cabal, MD;  Location: [place] [facility] CV LAB;  Service: Cardiovascular;  Laterality: N/A;   THORACIC LAMINECTOMY FOR EPIDURAL ABSCESS Bilateral 06/17/2020   Procedure: THORACIC LAMINECTOMY FOR EPIDURAL ABSCESS;  Surgeon: Deetta Perla, MD;  Location: [facility] ORS;  Service: Neurosurgery;  Laterality: Bilateral;    FAMILY HISTORY: Family History  Problem Relation Age of Onset   Cancer Paternal Aunt    Breast cancer Maternal Aunt 66    ADVANCED DIRECTIVES (Y/N):  @ADVDIR @  HEALTH MAINTENANCE: Social History   Tobacco Use   Smoking status: Never   Smokeless tobacco: Never  Vaping Use   Vaping Use: Never used  Substance Use Topics   Alcohol use: Yes    Alcohol/week: 1.0 standard drink    Types: 1 Cans of beer per week   Drug use: No     Colonoscopy:  PAP:  Bone density:  Lipid panel:  Allergies  Allergen Reactions   Amoxapine    Penicillins     Current Facility-Administered Medications  Medication Dose Route Frequency Provider Last Rate Last Admin   0.9 %  sodium chloride infusion (Manually program via Guardrails IV Fluids)   Intravenous Once Dwyane Dee, MD       acetaminophen (TYLENOL) tablet 650 mg  650 mg Oral Q6H PRN Cox,  Amy N, DO   650 mg at 12/12/20 0715   Or   acetaminophen (TYLENOL) suppository 650 mg  650 mg Rectal Q6H PRN Cox, Amy N, DO       albuterol (PROVENTIL) (2.5 MG/3ML) 0.083% nebulizer solution 3 mL  3 mL Inhalation Q6H PRN Cox, Amy N, DO       amLODipine (NORVASC) tablet 5 mg  5 mg Oral Daily Sreenath, Sudheer B, MD   5 mg at 12/11/20 1311   Chlorhexidine Gluconate Cloth 2 % PADS 6 each  6  each Topical Daily Enzo Bi, MD   6 each at 12/11/20 1312   diltiazem (CARDIZEM CD) 24 hr capsule 120 mg  120 mg Oral Daily Cox, Amy N, DO   120 mg at 12/11/20 1310   feeding supplement (BOOST / RESOURCE BREEZE) liquid 1 Container  1 Container Oral TID BM Enzo Bi, MD   1 Container at 12/11/20 2059   haloperidol lactate (HALDOL) injection 2 mg  2 mg Intravenous Q15 min PRN Enzo Bi, MD       heparin sodium (porcine) 1000 UNIT/ML injection            insulin aspart (novoLOG) injection 0-15 Units  0-15 Units Subcutaneous TID WC Enzo Bi, MD   2 Units at 12/12/20 0757   levothyroxine (SYNTHROID) tablet 100 mcg  100 mcg Oral QAC breakfast Ralene Muskrat B, MD   100 mcg at 12/12/20 0504   loperamide (IMODIUM) capsule 2 mg  2 mg Oral PRN Ralene Muskrat B, MD   2 mg at 12/05/20 0949   magnesium oxide (MAG-OX) tablet 800 mg  800 mg Oral BID Oswald Hillock, RPH   800 mg at 12/11/20 2057   mirtazapine (REMERON) tablet 15 mg  15 mg Oral QHS Ralene Muskrat B, MD   15 mg at 12/11/20 2057   multivitamin with minerals tablet 1 tablet  1 tablet Oral Daily Ralene Muskrat B, MD   1 tablet at 12/11/20 1310   ondansetron (ZOFRAN) tablet 4 mg  4 mg Oral Q6H PRN Cox, Amy N, DO       Or   ondansetron (ZOFRAN) injection 4 mg  4 mg Intravenous Q6H PRN Cox, Amy N, DO   4 mg at 11/23/20 0751   pantoprazole (PROTONIX) EC tablet 40 mg  40 mg Oral Daily Cox, Amy N, DO   40 mg at 12/11/20 1310   pneumococcal 13-valent conjugate vaccine (PREVNAR 13) injection 0.5 mL  0.5 mL Intramuscular Tomorrow-1000 Ravishankar, Jayashree, MD       polyethylene glycol (MIRALAX / GLYCOLAX) packet 34 g  34 g Oral BID PRN Enzo Bi, MD   17 g at 12/11/20 2057   psyllium (HYDROCIL/METAMUCIL) 1 packet  1 packet Oral Daily Ralene Muskrat B, MD   1 packet at 12/10/20 1042   rosuvastatin (CRESTOR) tablet 10 mg  10 mg Oral QHS Cox, Amy N, DO   10 mg at 12/11/20 2057   sodium bicarbonate tablet 650 mg  650 mg Oral TID Ulice Bold S, MD   650 mg at 12/11/20 2057   sodium chloride flush (NS) 0.9 % injection 10-40 mL  10-40 mL Intracatheter Q12H Enzo Bi, MD   10 mL at 12/11/20 2059   sodium chloride flush (NS) 0.9 % injection 10-40 mL  10-40 mL Intracatheter PRN Enzo Bi, MD   10 mL at 12/04/20 2128   thiamine tablet 100 mg  100 mg Oral Daily Oswald Hillock, [facility]  100 mg at 12/11/20 1310   Facility-Administered Medications Ordered in Other Encounters  Medication Dose Route Frequency Provider Last Rate Last Admin   0.9 %  sodium chloride infusion   Intravenous Once Lloyd Huger, MD       acetaminophen (TYLENOL) tablet 650 mg  650 mg Oral Once Lloyd Huger, MD       diphenhydrAMINE (BENADRYL) injection 50 mg  50 mg Intravenous Once Lloyd Huger, MD       famotidine (PEPCID) IVPB 20 mg in NS 100 mL IVPB  20 mg Intravenous Once Lloyd Huger, MD       methylPREDNISolone sodium succinate (SOLU-MEDROL) 125 mg/2 mL injection 100 mg  100 mg Intravenous Once Lloyd Huger, MD       riTUXimab-abbs (TRUXIMA) 1,000 mg in sodium chloride 0.9 % 250 mL (2.8571 mg/mL) infusion  1,000 mg Intravenous Once Lloyd Huger, MD       sodium chloride flush (NS) 0.9 % injection 10 mL  10 mL Intracatheter Once PRN Lloyd Huger, MD        OBJECTIVE: Vitals:   12/12/20 0945 12/12/20 1000  BP: 127/67 125/69  Pulse:    Resp: 20 14  Temp:    SpO2:       Body mass index is 28.23 kg/m.    ECOG FS:2 - Symptomatic, <50% confined to bed  General: Well-developed, well-nourished, no acute distress. Eyes: Pink conjunctiva, anicteric sclera. HEENT: Normocephalic, moist mucous membranes. Lungs: No audible wheezing or coughing. Heart: Regular rate and rhythm. Abdomen: Soft, nontender, no obvious distention. Musculoskeletal: No edema, cyanosis, or clubbing. Neuro: Alert, answering all questions appropriately. Cranial nerves grossly intact. Skin: No rashes or petechiae noted. Psych: Normal  affect.   LAB RESULTS:  Lab Results  Component Value Date   NA 135 12/12/2020   K 3.8 12/12/2020   CL 102 12/12/2020   CO2 29 12/12/2020   GLUCOSE 167 (H) 12/12/2020   BUN 32 (H) 12/12/2020   CREATININE 2.80 (H) 12/12/2020   CALCIUM 7.3 (L) 12/12/2020   PROT 4.9 (L) 12/12/2020   ALBUMIN 2.1 (L) 12/12/2020   AST 73 (H) 12/12/2020   ALT 26 12/12/2020   ALKPHOS 78 12/12/2020   BILITOT 0.9 12/12/2020   GFRNONAA 19 (L) 12/12/2020   GFRAA >60 01/17/2020    Lab Results  Component Value Date   WBC 6.1 12/12/2020   NEUTROABS 4.5 12/12/2020   HGB 7.3 (L) 12/12/2020   HCT 21.9 (L) 12/12/2020   MCV 80.5 12/12/2020   PLT 30 (L) 12/12/2020     STUDIES: DG Chest 1 View  Result Date: 12/04/2020 CLINICAL DATA:  Cough EXAM: CHEST 1 VIEW COMPARISON:  11/27/2020 FINDINGS: Few faint residual patchy opacities are present in the right mid lung. There is persistent basilar atelectasis as well. Asymmetric elevation of the right hemidiaphragm similar to prior. New left effusion. No right effusion. No pneumothorax. Stable cardiomediastinal contours. Right upper extremity PICC tip terminates at the superior cavoatrial junction. No acute osseous or soft tissue abnormality of the chest wall IMPRESSION: Few faint residual patchy opacities present in the right mid lung, possibly infectious or inflammatory as seen on CT and prior radiograph. New trace left effusion. Right upper extremity PICC terminates at the superior cavoatrial junction. Electronically Signed   By: Lovena Le M.D.   On: 12/04/2020 19:30   CT Head Wo Contrast  Result Date: 11/19/2020 CLINICAL DATA:  Mental status changes of unknown cause. EXAM: CT HEAD WITHOUT  CONTRAST TECHNIQUE: Contiguous axial images were obtained from the base of the skull through the vertex without intravenous contrast. COMPARISON:  10/02/2020 FINDINGS: Brain: The brain shows a normal appearance without evidence of malformation, atrophy, old or acute small or large  vessel infarction, mass lesion, hemorrhage, hydrocephalus or extra-axial collection. Vascular: No hyperdense vessel. No evidence of atherosclerotic calcification. Skull: Normal.  No traumatic finding.  No focal bone lesion. Sinuses/Orbits: Sinuses are clear. Orbits appear normal. Mastoids are clear. Other: None significant IMPRESSION: Normal head CT. Electronically Signed   By: Nelson Chimes M.D.   On: 11/19/2020 16:19   CT HEAD W & WO CONTRAST  Result Date: 11/21/2020 CLINICAL DATA:  Metastatic disease evaluation. Additional provided: Patient with history of recent pneumococcal meningitis and bacteremia, diabetes mellitus presenting with altered mental status. EXAM: CT HEAD WITHOUT AND WITH CONTRAST TECHNIQUE: Contiguous axial images were obtained from the base of the skull through the vertex without and with intravenous contrast CONTRAST:  137m OMNIPAQUE IOHEXOL 350 MG/ML SOLN COMPARISON:  Prior head CT examinations 11/19/2020 and earlier. FINDINGS: Brain: Cerebral volume is normal for age. There is no acute intracranial hemorrhage. No demarcated cortical infarct. No extra-axial fluid collection. No evidence of an intracranial mass. No midline shift. No pathologic intracranial enhancement is identified. Vascular: Atherosclerotic calcifications. Skull: Normal. Negative for fracture or focal suspicious osseous lesion. Sinuses/Orbits: Visualized orbits show no acute finding. Minimal frothy secretions within the left sphenoid sinus. Small right sphenoid sinus mucous retention cyst. Minimal partial opacification of the posterior right ethmoid air cells. IMPRESSION: No evidence of acute intracranial abnormality. No CT evidence of intracranial metastatic disease. Mild paranasal sinus disease at the imaged levels, as described. Electronically Signed   By: KKellie SimmeringDO   On: 11/21/2020 15:42   MR BRAIN W WO CONTRAST  Result Date: 11/28/2020 CLINICAL DATA:  Mental status change, unknown cause; encephalopathy of  unknown cause; recurrent fever. EXAM: MRI HEAD WITHOUT AND WITH CONTRAST TECHNIQUE: Multiplanar, multiecho pulse sequences of the brain and surrounding structures were obtained without and with intravenous contrast. CONTRAST:  832mGADAVIST GADOBUTROL 1 MMOL/ML IV SOLN COMPARISON:  Head CT November 21, 2020 FINDINGS: Brain: Bilateral T2 hyperintense subdural fluid collections measuring up to 5 mm on the right and 6 mm on the left with mild mass effect on the adjacent brain parenchyma without midline shift. Postcontrast images are severely degraded by motion. Mild diffuse pachymeningeal contrast enhancement seen. The brain parenchyma has normal morphology and signal characteristics. No acute infarction, hemorrhage, hydrocephalus, or mass lesion. Vascular: Normal flow voids. Skull and upper cervical spine: Normal marrow signal. Sinuses/Orbits: Negative. Other: None. IMPRESSION: Interval development of small bilateral subdural fluid collections, may be related to recent lumbar puncture. Electronically Signed   By: KaPedro Earls.D.   On: 11/28/2020 14:54   MR CERVICAL SPINE W WO CONTRAST  Result Date: 11/28/2020 CLINICAL DATA:  History of epidural abscess and paraspinal abscess in the setting of recurrent fever and altered mental status. EXAM: MRI CERVICAL, THORACIC AND LUMBAR SPINE WITHOUT AND WITH CONTRAST TECHNIQUE: Multiplanar and multiecho pulse sequences of the cervical spine, to include the craniocervical junction and cervicothoracic junction, and thoracic and lumbar spine, were obtained without and with intravenous contrast. CONTRAST:  39m21mADAVIST GADOBUTROL 1 MMOL/ML IV SOLN COMPARISON:  MRI of the cervical, thoracic and lumbar spine June 17, 2020. FINDINGS: The study is partially degraded by motion. Postcontrast axial images are essentially nondiagnostic. MRI CERVICAL SPINE FINDINGS Alignment: Physiologic. Vertebrae: No fracture, evidence  of discitis, or bone lesion. Cord: Increased T2  signal within the cord at the C3 level. Posterior Fossa, vertebral arteries, paraspinal tissues: Negative. Disc levels: C2-3: Posterior disc protrusion causing mild mass effect on the cord without significant spinal canal stenosis. Facet degenerative changes resulting mild left neural foraminal narrowing. C3-4: Left posterolateral disc protrusion causing moderate to severe spinal canal stenosis with mass effect on the cord. Increased T2 signal is seen within the cord immediately above the level of compression, likely edema. Uncovertebral and facet degenerative changes resulting in moderate right and severe left neural foraminal narrowing. C4-5: Posterior disc protrusion asymmetric to the left causing mass effect on the cord and resulting in moderate spinal canal stenosis. Uncovertebral and facet degenerative changes resulting in severe bilateral neural foraminal narrowing. C5-6: Posterior disc osteophyte complex causing mass effect on the cord and resulting in moderate spinal canal stenosis. Uncovertebral and facet degenerative changes resulting severe bilateral neural foraminal narrowing. C6-7: Left posterolateral disc protrusion causing indentation of the thecal sac and resulting mild spinal canal stenosis. Uncovertebral and facet degenerative changes resulting in mild bilateral neural foraminal narrowing. C7-T1: Left posterolateral disc protrusion causing indentation on the thecal sac and resulting in mild left neural foraminal narrowing. No significant spinal canal stenosis. MRI THORACIC SPINE FINDINGS Alignment:  Physiologic. Vertebrae: No fracture, evidence of discitis, or bone lesion. Postsurgical changes from T3-4, T5-7 and T9-10 laminectomies. Interval resolution of the large posterior epidural fluid collection. Cord: Central increased T2 signal within the cord at T6-7, T7-8 and from the T8-9 level to the conus medullaris. Paraspinal and other soft tissues: Small bilateral pleural effusion. A 1.5 cm T2  hyperintense hepatic lesion. Disc levels: Tiny posterior disc protrusions at T3-4, T5-6, T6-7 and T7-8 causing minimal indentation on the thecal sac. No significant spinal canal or neural foraminal stenosis at any thoracic level. MRI LUMBAR SPINE FINDINGS Segmentation:  Standard. Alignment:  Physiologic. Vertebrae: No fracture, evidence of discitis, or bone lesion. Endplate degenerative changes at L5-S1. Conus medullaris and cauda equina: Conus extends to the L1-2 level. Increased T2 signal within the conus medullaris, as described above. 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 Paraspinal and other soft tissues: Negative. Disc levels: T12-L1: No spinal canal or neural foraminal stenosis. L1-2: No spinal canal or neural foraminal stenosis. L2-3: Shallow disc bulge and moderate facet degenerative changes without significant spinal canal or neural foraminal stenosis. L3-4: Moderate facet degenerative changes. No spinal canal or neural foraminal stenosis. L4-5: Mild loss of disc height, disc bulge and moderate facet degenerative changes without significant spinal canal or neural foraminal stenosis. L5-S1: Loss of disc height, disc bulge with superimposed left central/subarticular disc protrusion and moderate facet degenerative changes. Findings result in narrowing of the left subarticular zone with displacement of the traversing left S1 nerve root. No significant neural foraminal narrowing. IMPRESSION: 1. The study is degraded by motion, particularly the postcontrast images. 2. Interval resolution of the large posterior thoracic epidural fluid collection with postsurgical changes from multilevel laminectomy noted in the thoracic spine. No new fluid collection identified. 3. Increased T2 signal within the cord centrally at T6-7, T7-8 and from the T8-9 level to the conus medullaris without cord expansion. This may represent post ischemic changes related  to now resolved cord compression. 4. 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 5. Advanced degenerative changes of the cervical spine, with moderate to severe  spinal canal stenosis at C3-4 and moderate at C4-5 and C5-6. 6. Multilevel high-grade neural foraminal narrowing at the cervical spine. 7. Mild degenerative changes of the thoracic spine without spinal canal or neural foraminal stenosis. 8. Degenerative disc disease at L5-S1 with narrowing of the left subarticular zone and likely impingement of the traversing left S1 nerve root. Electronically Signed   By: Pedro Earls M.D.   On: 11/28/2020 15:41   MR THORACIC SPINE W WO CONTRAST  Result Date: 11/28/2020 CLINICAL DATA:  History of epidural abscess and paraspinal abscess in the setting of recurrent fever and altered mental status. EXAM: MRI CERVICAL, THORACIC AND LUMBAR SPINE WITHOUT AND WITH CONTRAST TECHNIQUE: Multiplanar and multiecho pulse sequences of the cervical spine, to include the craniocervical junction and cervicothoracic junction, and thoracic and lumbar spine, were obtained without and with intravenous contrast. CONTRAST:  72m GADAVIST GADOBUTROL 1 MMOL/ML IV SOLN COMPARISON:  MRI of the cervical, thoracic and lumbar spine June 17, 2020. FINDINGS: The study is partially degraded by motion. Postcontrast axial images are essentially nondiagnostic. MRI CERVICAL SPINE FINDINGS Alignment: Physiologic. Vertebrae: No fracture, evidence of discitis, or bone lesion. Cord: Increased T2 signal within the cord at the C3 level. Posterior Fossa, vertebral arteries, paraspinal tissues: Negative. Disc levels: C2-3: Posterior disc protrusion causing mild mass effect on the cord without significant spinal canal stenosis. Facet degenerative changes resulting mild left neural foraminal narrowing. C3-4: Left posterolateral disc protrusion causing moderate to severe spinal  canal stenosis with mass effect on the cord. Increased T2 signal is seen within the cord immediately above the level of compression, likely edema. Uncovertebral and facet degenerative changes resulting in moderate right and severe left neural foraminal narrowing. C4-5: Posterior disc protrusion asymmetric to the left causing mass effect on the cord and resulting in moderate spinal canal stenosis. Uncovertebral and facet degenerative changes resulting in severe bilateral neural foraminal narrowing. C5-6: Posterior disc osteophyte complex causing mass effect on the cord and resulting in moderate spinal canal stenosis. Uncovertebral and facet degenerative changes resulting severe bilateral neural foraminal narrowing. C6-7: Left posterolateral disc protrusion causing indentation of the thecal sac and resulting mild spinal canal stenosis. Uncovertebral and facet degenerative changes resulting in mild bilateral neural foraminal narrowing. C7-T1: Left posterolateral disc protrusion causing indentation on the thecal sac and resulting in mild left neural foraminal narrowing. No significant spinal canal stenosis. MRI THORACIC SPINE FINDINGS Alignment:  Physiologic. Vertebrae: No fracture, evidence of discitis, or bone lesion. Postsurgical changes from T3-4, T5-7 and T9-10 laminectomies. Interval resolution of the large posterior epidural fluid collection. Cord: Central increased T2 signal within the cord at T6-7, T7-8 and from the T8-9 level to the conus medullaris. Paraspinal and other soft tissues: Small bilateral pleural effusion. A 1.5 cm T2 hyperintense hepatic lesion. Disc levels: Tiny posterior disc protrusions at T3-4, T5-6, T6-7 and T7-8 causing minimal indentation on the thecal sac. No significant spinal canal or neural foraminal stenosis at any thoracic level. MRI LUMBAR SPINE FINDINGS Segmentation:  Standard. Alignment:  Physiologic. Vertebrae: No fracture, evidence of discitis, or bone lesion. Endplate  degenerative changes at L5-S1. Conus medullaris and cauda equina: Conus extends to the L1-2 level. Increased T2 signal within the conus medullaris, as described above. 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 Paraspinal and other soft tissues: Negative. Disc levels: T12-L1: No spinal canal or neural foraminal stenosis. L1-2: No spinal canal or neural foraminal stenosis. L2-3: Shallow disc bulge and  moderate facet degenerative changes without significant spinal canal or neural foraminal stenosis. L3-4: Moderate facet degenerative changes. No spinal canal or neural foraminal stenosis. L4-5: Mild loss of disc height, disc bulge and moderate facet degenerative changes without significant spinal canal or neural foraminal stenosis. L5-S1: Loss of disc height, disc bulge with superimposed left central/subarticular disc protrusion and moderate facet degenerative changes. Findings result in narrowing of the left subarticular zone with displacement of the traversing left S1 nerve root. No significant neural foraminal narrowing. IMPRESSION: 1. The study is degraded by motion, particularly the postcontrast images. 2. Interval resolution of the large posterior thoracic epidural fluid collection with postsurgical changes from multilevel laminectomy noted in the thoracic spine. No new fluid collection identified. 3. Increased T2 signal within the cord centrally at T6-7, T7-8 and from the T8-9 level to the conus medullaris without cord expansion. This may represent post ischemic changes related to now resolved cord compression. 4. 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 5. 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 6. Multilevel high-grade neural foraminal narrowing at the cervical spine. 7. Mild  degenerative changes of the thoracic spine without spinal canal or neural foraminal stenosis. 8. Degenerative disc disease at L5-S1 with narrowing of the left subarticular zone and likely impingement of the traversing left S1 nerve root. Electronically Signed   By: Pedro Earls M.D.   On: 11/28/2020 15:41   MR Lumbar Spine W Wo Contrast  Result Date: 11/28/2020 CLINICAL DATA:  History of epidural abscess and paraspinal abscess in the setting of recurrent fever and altered mental status. EXAM: MRI CERVICAL, THORACIC AND LUMBAR SPINE WITHOUT AND WITH CONTRAST TECHNIQUE: Multiplanar and multiecho pulse sequences of the cervical spine, to include the craniocervical junction and cervicothoracic junction, and thoracic and lumbar spine, were obtained without and with intravenous contrast. CONTRAST:  64m GADAVIST GADOBUTROL 1 MMOL/ML IV SOLN COMPARISON:  MRI of the cervical, thoracic and lumbar spine June 17, 2020. FINDINGS: The study is partially degraded by motion. Postcontrast axial images are essentially nondiagnostic. MRI CERVICAL SPINE FINDINGS Alignment: Physiologic. Vertebrae: No fracture, evidence of discitis, or bone lesion. Cord: Increased T2 signal within the cord at the C3 level. Posterior Fossa, vertebral arteries, paraspinal tissues: Negative. Disc levels: C2-3: Posterior disc protrusion causing mild mass effect on the cord without significant spinal canal stenosis. Facet degenerative changes resulting mild left neural foraminal narrowing. C3-4: Left posterolateral disc protrusion causing moderate to severe spinal canal stenosis with mass effect on the cord. Increased T2 signal is seen within the cord immediately above the level of compression, likely edema. Uncovertebral and facet degenerative changes resulting in moderate right and severe left neural foraminal narrowing. C4-5: Posterior disc protrusion asymmetric to the left causing mass effect on the cord and resulting in moderate  spinal canal stenosis. Uncovertebral and facet degenerative changes resulting in severe bilateral neural foraminal narrowing. C5-6: Posterior disc osteophyte complex causing mass effect on the cord and resulting in moderate spinal canal stenosis. Uncovertebral and facet degenerative changes resulting severe bilateral neural foraminal narrowing. C6-7: Left posterolateral disc protrusion causing indentation of the thecal sac and resulting mild spinal canal stenosis. Uncovertebral and facet degenerative changes resulting in mild bilateral neural foraminal narrowing. C7-T1: Left posterolateral disc protrusion causing indentation on the thecal sac and resulting in mild left neural foraminal narrowing. No significant  spinal canal stenosis. MRI THORACIC SPINE FINDINGS Alignment:  Physiologic. Vertebrae: No fracture, evidence of discitis, or bone lesion. Postsurgical changes from T3-4, T5-7 and T9-10 laminectomies. Interval resolution of the large posterior epidural fluid collection. Cord: Central increased T2 signal within the cord at T6-7, T7-8 and from the T8-9 level to the conus medullaris. Paraspinal and other soft tissues: Small bilateral pleural effusion. A 1.5 cm T2 hyperintense hepatic lesion. Disc levels: Tiny posterior disc protrusions at T3-4, T5-6, T6-7 and T7-8 causing minimal indentation on the thecal sac. No significant spinal canal or neural foraminal stenosis at any thoracic level. MRI LUMBAR SPINE FINDINGS Segmentation:  Standard. Alignment:  Physiologic. Vertebrae: No fracture, evidence of discitis, or bone lesion. Endplate degenerative changes at L5-S1. Conus medullaris and cauda equina: Conus extends to the L1-2 level. Increased T2 signal within the conus medullaris, as described above. 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 Paraspinal and other soft tissues: Negative. Disc levels: T12-L1: No spinal canal or neural foraminal stenosis.  L1-2: No spinal canal or neural foraminal stenosis. L2-3: Shallow disc bulge and moderate facet degenerative changes without significant spinal canal or neural foraminal stenosis. L3-4: Moderate facet degenerative changes. No spinal canal or neural foraminal stenosis. L4-5: Mild loss of disc height, disc bulge and moderate facet degenerative changes without significant spinal canal or neural foraminal stenosis. L5-S1: Loss of disc height, disc bulge with superimposed left central/subarticular disc protrusion and moderate facet degenerative changes. Findings result in narrowing of the left subarticular zone with displacement of the traversing left S1 nerve root. No significant neural foraminal narrowing. IMPRESSION: 1. The study is degraded by motion, particularly the postcontrast images. 2. Interval resolution of the large posterior thoracic epidural fluid collection with postsurgical changes from multilevel laminectomy noted in the thoracic spine. No new fluid collection identified. 3. Increased T2 signal within the cord centrally at T6-7, T7-8 and from the T8-9 level to the conus medullaris without cord expansion. This may represent post ischemic changes related to now resolved cord compression. 4. 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 5. 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 6. Multilevel high-grade neural foraminal narrowing at the cervical spine. 7. Mild degenerative changes of the thoracic spine without spinal canal or neural foraminal stenosis. 8. Degenerative disc disease at L5-S1 with narrowing of the left subarticular zone and likely impingement of the traversing left S1 nerve root. Electronically Signed   By: Pedro Earls M.D.   On: 11/28/2020 15:41   US RENAL  Result Date: 12/01/2020 CLINICAL DATA:  Acute  kidney injury, history CLL, diabetes mellitus, hypertension EXAM: RENAL / URINARY TRACT ULTRASOUND COMPLETE COMPARISON:  CT abdomen and pelvis 11/21/2020 FINDINGS: Right Kidney: Renal measurements: 11.4 x 5.8 x 6.0 cm = volume: 206 mL. Normal cortical thickness. Increased cortical echogenicity. No mass, hydronephrosis, or shadowing calcification. Left Kidney: Renal measurements: 13.1 x 7.3 x 6.1 cm = volume: 3 L4 mL. Normal cortical thickness. Increased cortical echogenicity. Tiny cyst at mid kidney 9 x 13 x 13 mm, simple features. No additional mass, hydronephrosis, or shadowing calcification. Bladder: Partially distended. Cystic intraluminal focus at the posterior wall RIGHT of midline question RIGHT ureterocele approximately 16 x 6 x 9 mm in size. Other: N/A IMPRESSION: Medical renal disease changes of both kidneys. Intraluminal  cystic focus posterior RIGHT bladder, favor representing a small RIGHT ureterocele. Electronically Signed   By: Lavonia Dana M.D.   On: 12/01/2020 16:26   CT CHEST ABDOMEN PELVIS W CONTRAST  Result Date: 11/21/2020 CLINICAL DATA:  Altered mental status. Recent history of pneumococcal meningitis and bacteremia. Coffee ground emesis. EXAM: CT CHEST, ABDOMEN, AND PELVIS WITH CONTRAST TECHNIQUE: Multidetector CT imaging of the chest, abdomen and pelvis was performed following the standard protocol during bolus administration of intravenous contrast. CONTRAST:  100 mL OMNIPAQUE IOHEXOL 350 MG/ML SOLN COMPARISON:  CT abdomen and pelvis 05/23/2020. FINDINGS: CT CHEST FINDINGS Cardiovascular: There is mild cardiomegaly and a small pericardial effusion. No atherosclerosis is seen. Mediastinum/Nodes: Innumerable axillary and subpectoral lymph nodes are new since the prior chest CT. Index node in the right axilla measuring 1 cm short axis dimension is seen on image 21 of series 3. A left subpectoral node measuring 1.3 cm is seen on image 24. Small mediastinal lymph nodes include a precarinal node  on image 27 measuring 1 cm. The esophagus and thyroid are negative. Lungs/Pleura: There is some dependent atelectasis. A 0.7 x 0.5 cm right upper lobe nodule on image 72 of series 6 measure approximately 0.7 x 0.4 cm on the prior exam. There is extensive micronodularity in the right upper lobe. Musculoskeletal: No acute or focal abnormality CT ABDOMEN PELVIS FINDINGS Hepatobiliary: No focal liver abnormality is seen. No gallstones, gallbladder wall thickening, or biliary dilatation. The liver is diffusely low attenuating consistent with marked fatty infiltration. Pancreas: Vanessa Leeds is present about the pancreas throughout is consistent with acute pancreatitis. No focal fluid collection. The pancreas enhances homogeneously. Spleen: Normal in size without focal abnormality. Adrenals/Urinary Tract: The adrenal glands appear normal. A single small cyst is seen in the left kidney. On delayed imaging, there are areas of decreased attenuation in both kidneys which may be artifactual. No hydronephrosis. No ureteral or urinary bladder stone. Stomach/Bowel: Stomach is within normal limits. Appendix appears normal. No evidence of bowel wall thickening, distention, or inflammatory changes. Vascular/Lymphatic: No significant vascular findings are present. No enlarged abdominal or pelvic lymph nodes. Reproductive: Status post hysterectomy. No adnexal masses. Other: None. Musculoskeletal: No acute or focal bony abnormality. IMPRESSION: The examination is positive for findings consistent with acute pancreatitis without pseudocyst formation or pancreatic necrosis. Micronodularity in the right upper lobe is worrisome for bronchopneumonia. 0.7 x 0.5 cm right upper lobe nodule has mildly increased in size since 2017. Non-contrast chest CT at 6-12 months is recommended. If the nodule is stable at time of repeat CT, then future CT at 18-24 months (from today's scan) is considered optional for low-risk patients, but is recommended for  high-risk patients. This recommendation follows the consensus statement: Guidelines for Management of Incidental Pulmonary Nodules Detected on CT Images: From the Fleischner Society 2017; Radiology 2017; 284:228-243. Multiple axillary and subpectoral lymph nodes are abnormal in number and likely related to this patient's history of CLL. Cardiomegaly and small pericardial effusion. Marked fatty infiltration of the liver. Decreased cortical attenuation the kidneys is likely artifactual. Correlation with urinalysis could be used to exclude pyelonephritis. Electronically Signed   By: Inge Rise M.D.   On: 11/21/2020 16:55   PERIPHERAL VASCULAR CATHETERIZATION  Result Date: 12/10/2020 See surgical note for result.  US Venous Img Lower Bilateral (DVT)  Result Date: 12/05/2020 CLINICAL DATA:  Fever unknown origin EXAM: BILATERAL LOWER EXTREMITY VENOUS DOPPLER ULTRASOUND TECHNIQUE: Gray-scale sonography with compression, as well as color and duplex ultrasound, were performed to evaluate  the deep venous system(s) from the level of the common femoral vein through the popliteal and proximal calf veins. COMPARISON:  None. FINDINGS: VENOUS On the left, normal compressibility of the common femoral, superficial femoral, and popliteal veins, as well as the visualized calf veins. Visualized portions of profunda femoral vein and great saphenous vein unremarkable. No filling defects to suggest DVT on grayscale or color Doppler imaging. Doppler waveforms show normal direction of venous flow, normal respiratory phasicity and response to augmentation. On the right, incomplete compressibility of the proximal popliteal vein with eccentric mural thickening containing linear echogenic regions. There is continued patency of the lumen with flow seen on color Doppler. Common femoral, femoral, and visualized calf veins are unremarkable. OTHER None. Limitations: none IMPRESSION: 1. Negative for acute DVT. 2. 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 Electronically Signed   By: Lucrezia Europe M.D.   On: 12/05/2020 07:25   US Venous Img Upper Bilat (DVT)  Result Date: 12/05/2020 CLINICAL DATA:  Fever of unknown origin, diabetes, numbness EXAM: BILATERAL UPPER EXTREMITY VENOUS DOPPLER ULTRASOUND TECHNIQUE: Gray-scale sonography with graded compression, as well as color Doppler and duplex ultrasound were performed to evaluate the bilateral upper extremity deep venous systems from the level of the subclavian vein and including the jugular, axillary, basilic, radial, ulnar and upper cephalic vein. Spectral Doppler was utilized to evaluate flow at rest and with distal augmentation maneuvers. COMPARISON:  None. FINDINGS: RIGHT UPPER EXTREMITY Internal Jugular Vein: No evidence of thrombus. Normal compressibility, respiratory phasicity and response to augmentation. Subclavian Vein: No evidence of thrombus. Normal compressibility, respiratory phasicity and response to augmentation. Axillary Vein: No evidence of thrombus. Normal compressibility, respiratory phasicity and response to augmentation. Cephalic Vein: Not visualized Basilic Vein: No evidence of thrombus. Normal compressibility, respiratory phasicity and response to augmentation. Brachial Veins: No evidence of thrombus. Normal compressibility, respiratory phasicity and response to augmentation. Radial Veins: No evidence of thrombus. Normal compressibility, respiratory phasicity and response to augmentation. Ulnar Veins: No evidence of thrombus. Normal compressibility, respiratory phasicity and response to augmentation. Venous Reflux:  None. Other Findings:  None. LEFT UPPER EXTREMITY Internal Jugular Vein: No evidence of thrombus. Normal compressibility, respiratory phasicity and response to augmentation. Subclavian Vein: No evidence of thrombus. Normal compressibility, respiratory phasicity and response to augmentation. Axillary Vein: No  evidence of thrombus. Normal compressibility, respiratory phasicity and response to augmentation. Cephalic Vein: Not visualized Basilic Vein: No evidence of thrombus. Normal compressibility, respiratory phasicity and response to augmentation. Brachial Veins: No evidence of thrombus. Normal compressibility, respiratory phasicity and response to augmentation. Radial Veins: No evidence of thrombus. Normal compressibility, respiratory phasicity and response to augmentation. Ulnar Veins: No evidence of thrombus. Normal compressibility, respiratory phasicity and response to augmentation. Venous Reflux:  None. Other Findings:  None. IMPRESSION: No evidence of DVT within either upper extremity. Electronically Signed   By: Lucrezia Europe M.D.   On: 12/05/2020 07:28   DG Chest Port 1 View  Result Date: 11/27/2020 CLINICAL DATA:  Fever and chest pain. EXAM: PORTABLE CHEST 1 VIEW COMPARISON:  And CT chest, abdomen and pelvis 11/21/2020. Single-view of the chest 10/01/2020. FINDINGS: Subtle micro nodules are seen in the right upper lobe. The left lung demonstrates minimal atelectasis in the base. Heart size is normal. No pneumothorax or pleural fluid. No acute or focal bony abnormality. IMPRESSION: Subtle focus of mild micronodularity in the right upper lobe compatible with residual infectious or inflammatory process as seen on prior CT. Recommend continued  follow-up to clearing. Electronically Signed   By: Inge Rise M.D.   On: 11/27/2020 11:36   DG Chest Portable 1 View  Result Date: 11/19/2020 CLINICAL DATA:  Cough, fever EXAM: PORTABLE CHEST 1 VIEW COMPARISON:  10/01/2020 FINDINGS: Heart size is upper limits of normal. Low lung volumes. Mildly increased interstitial markings bilaterally. No lobar consolidation. No pleural effusion or pneumothorax. IMPRESSION: Low lung volumes with mildly increased interstitial markings bilaterally may reflect mild edema or atypical/viral infection. Electronically Signed   By:  Davina Poke D.O.   On: 11/19/2020 16:22   EEG adult  Result Date: 11/25/2020 Lora Havens, MD     11/25/2020  5:08 PM Patient Name: Tonya Myers MRN: [id number] Epilepsy Attending: Lora Havens Referring Physician/Provider: Dr Rosalin Hawking Date: 11/25/2020 Duration: 31.44 mins Patient history: 55yo F with ams. EEG to evaluate for seizure Level of alertness: Awake AEDs during EEG study: None Technical aspects: This EEG study was done with scalp electrodes positioned according to the 10-20 International system of electrode placement. Electrical activity was acquired at a sampling rate of 500Hz  and reviewed with a high frequency filter of 70Hz  and a low frequency filter of 1Hz . EEG data were recorded continuously and digitally stored. Description: No posterior dominant rhythm was seen. EEG showed continuous generalized 3 to 6 Hz theta-delta slowing. Physiologic photic driving was not seen during photic stimulation.  Hyperventilation was not performed.   ABNORMALITY - Continuous slow, generalized IMPRESSION: This study is suggestive of moderate diffuse encephalopathy, nonspecific etiology. No seizures or epileptiform discharges were seen throughout the recording. Priyanka [name]   CT BONE MARROW BIOPSY  Result Date: 12/04/2020 CLINICAL DATA:  Chronic lymphocytic leukemia EXAM: CT GUIDED DEEP ILIAC BONE ASPIRATION AND CORE BIOPSY TECHNIQUE: Patient was placed prone on the CT gantry and limited axial scans through the pelvis were obtained. Appropriate skin entry site was identified. Skin site was marked, prepped with chlorhexidine, draped in usual sterile fashion, and infiltrated locally with 1% lidocaine. Patient was given Versed 2 mg IV for anxiolysis during continuous cardiorespiratory monitoring by the radiology RN, during the procedure time of 11 minutes. Under CT fluoroscopic guidance an 11-gauge Cook trocar bone needle was advanced into the right iliac bone just lateral to the sacroiliac joint.  Once needle tip position was confirmed, aspiration and 2 core samples were obtained, submitted to pathology for approval. Post procedure scans show no hematoma or fracture. Patient tolerated procedure well. COMPLICATIONS: COMPLICATIONS none IMPRESSION: 1. Technically successful CT guided right iliac bone core and aspiration biopsy. Electronically Signed   By: Lucrezia Europe M.D.   On: 12/04/2020 15:53   ECHOCARDIOGRAM COMPLETE  Result Date: 12/05/2020    ECHOCARDIOGRAM REPORT   Patient Name:   MECHELL GIRGIS Date of Exam: 12/05/2020 Medical Rec #:  [id number]       Height:       72.0 in Accession #:    [id number]      Weight:       208.1 lb Date of Birth:  10/29/1965       BSA:          2.167 m Patient Age:    55 years        BP:           136/71 mmHg Patient Gender: F               HR:           80 bpm. Exam Location:  [facility] Procedure:  2D Echo, Color Doppler and Cardiac Doppler Indications:     R50.9 Fever  History:         Patient has prior history of Echocardiogram examinations, most                  recent 06/11/2020. Signs/Symptoms:Fever; Risk                  Factors:Hypertension and Diabetes. Asthma.  Sonographer:     Charmayne Sheer Referring Phys:  [id number] [name] Diagnosing Phys: Yolonda Kida MD  Sonographer Comments: Suboptimal apical window. IMPRESSIONS  1. Left ventricular ejection fraction, by estimation, is 60 to 65%. The left ventricle has normal function. The left ventricle has no regional wall motion abnormalities. Left ventricular diastolic parameters were normal.  2. Right ventricular systolic function is normal. The right ventricular size is normal. Mildly increased right ventricular wall thickness.  3. The mitral valve is normal in structure. No evidence of mitral valve regurgitation.  4. The aortic valve is normal in structure. Aortic valve regurgitation is not visualized. FINDINGS  Left Ventricle: Left ventricular ejection fraction, by estimation, is 60 to 65%. The left ventricle  has normal function. The left ventricle has no regional wall motion abnormalities. The left ventricular internal cavity size was normal in size. There is  borderline concentric left ventricular hypertrophy. Left ventricular diastolic parameters were normal. Right Ventricle: The right ventricular size is normal. Mildly increased right ventricular wall thickness. Right ventricular systolic function is normal. Left Atrium: Left atrial size was normal in size. Right Atrium: Right atrial size was normal in size. Pericardium: There is no evidence of pericardial effusion. Mitral Valve: The mitral valve is normal in structure. No evidence of mitral valve regurgitation. MV peak gradient, 2.6 mmHg. The mean mitral valve gradient is 1.0 mmHg. Tricuspid Valve: The tricuspid valve is normal in structure. Tricuspid valve regurgitation is not demonstrated. Aortic Valve: The aortic valve is normal in structure. Aortic valve regurgitation is not visualized. Aortic valve mean gradient measures 7.0 mmHg. Aortic valve peak gradient measures 12.5 mmHg. Aortic valve area, by VTI measures 2.59 cm. Pulmonic Valve: The pulmonic valve was normal in structure. Pulmonic valve regurgitation is not visualized. Aorta: The ascending aorta was not well visualized. IAS/Shunts: No atrial level shunt detected by color flow Doppler.  LEFT VENTRICLE PLAX 2D LVIDd:         4.91 cm  Diastology LVIDs:         3.33 cm  LV e' medial:    6.85 cm/s LV PW:         0.94 cm  LV E/e' medial:  10.7 LV IVS:        0.77 cm  LV e' lateral:   7.62 cm/s LVOT diam:     2.10 cm  LV E/e' lateral: 9.6 LV SV:         73 LV SV Index:   34 LVOT Area:     3.46 cm  LEFT ATRIUM             Index LA diam:        3.50 cm 1.62 cm/m LA Vol (A2C):   42.9 ml 19.80 ml/m LA Vol (A4C):   56.8 ml 26.21 ml/m LA Biplane Vol: 51.6 ml 23.81 ml/m  AORTIC VALVE                    PULMONIC VALVE AV Area (Vmax):    2.29 cm  PV Vmax:       1.40 m/s AV Area (Vmean):   2.48 cm     PV  Vmean:      92.300 cm/s AV Area (VTI):     2.59 cm     PV VTI:        0.187 m AV Vmax:           177.00 cm/s  PV Peak grad:  7.8 mmHg AV Vmean:          118.000 cm/s PV Mean grad:  4.0 mmHg AV VTI:            0.284 m AV Peak Grad:      12.5 mmHg AV Mean Grad:      7.0 mmHg LVOT Vmax:         117.00 cm/s LVOT Vmean:        84.600 cm/s LVOT VTI:          0.212 m LVOT/AV VTI ratio: 0.75  AORTA Ao Root diam: 2.80 cm MITRAL VALVE MV Area (PHT): 2.74 cm    SHUNTS MV Area VTI:   3.16 cm    Systemic VTI:  0.21 m MV Peak grad:  2.6 mmHg    Systemic Diam: 2.10 cm MV Mean grad:  1.0 mmHg MV Vmax:       0.80 m/s MV Vmean:      56.2 cm/s MV Decel Time: 277 msec MV E velocity: 73.30 cm/s MV A velocity: 66.80 cm/s MV E/A ratio:  1.10 Yolonda Kida MD Electronically signed by Yolonda Kida MD Signature Date/Time: 12/05/2020/6:14:29 PM    Final    [place] EKG SITE RITE  Result Date: 11/27/2020 If Site Rite image not attached, placement could not be confirmed due to current cardiac rhythm.  DG FL GUIDED LUMBAR PUNCTURE  Result Date: 11/20/2020 CLINICAL DATA:  Altered mental status EXAM: DIAGNOSTIC LUMBAR PUNCTURE UNDER FLUOROSCOPIC GUIDANCE COMPARISON:  None FLUOROSCOPY TIME:  Fluoroscopy Time:  12 seconds Radiation Exposure Index (if provided by the fluoroscopic device): 1.6 mGy PROCEDURE: Informed consent was obtained from the patient's husband prior to the procedure, including potential complications of headache, allergy, and pain. With the patient prone, the lower back was prepped with Betadine. 1% Lidocaine was used for local anesthesia. Lumbar puncture was performed at the L4-L5 level using a 20 gauge needle with return of clear CSF. 8 ml of CSF were obtained for laboratory studies. The patient tolerated the procedure well and there were no apparent complications. IMPRESSION: Technically successful fluoroscopic guided lumbar puncture. Electronically Signed   By: Macy Mis M.D.   On: 11/20/2020 11:42     ASSESSMENT: Altered mental status/confusion, acute pancreatitis.  PLAN:    1.  Acute encephalopathy/altered mental status/confusion: Possibly related to underlying lupus/vasculitis.  Plan to give empiric Rituxan 1000 mg IV today.  Infectious work-up appears to be negative. Highly unlikely related to underlying CLL given unimpressive lumbar fluid with negative cytology as well as negative head CT.  Also minimal if any disease present systemically.  Bone marrow biopsy was unrevealing. 2.  Pancytopenia: Chronic and unchanged.  Bone marrow biopsy did not reveal any significant pathology.  Possibly related to underlying lupus/vasculitis.  Proceed with Rituxan as above. 3.  CLL: Patient has been in remission since 2016.  No overt systemic evidence of aggressive disease.  4.  Acute renal failure: Unclear etiology.  Possible vasculitis/lupus.  Appreciate nephrology input.  Patient had dialysis this morning.  Rituxan as above.  Will  follow.   Lloyd Huger, MD   12/12/2020 10:42 AM

## 2020-12-12 NOTE — Progress Notes (Signed)
PT Cancellation Note  Patient Details Name: Tonya Myers MRN: [id number] DOB: 06-07-1965   Cancelled Treatment:    Reason Eval/Treat Not Completed: Patient at procedure or test/unavailable Per chart review pt off the unit for HD this AM. Will continue to follow per POC as pt available.   Lavone Nian, PT, DPT 12/12/20, 9:07 AM    Waunita Schooner 12/12/2020, 9:07 AM

## 2020-12-13 DIAGNOSIS — D638 Anemia in other chronic diseases classified elsewhere: Secondary | ICD-10-CM | POA: Diagnosis not present

## 2020-12-13 DIAGNOSIS — D8989 Other specified disorders involving the immune mechanism, not elsewhere classified: Secondary | ICD-10-CM | POA: Diagnosis not present

## 2020-12-13 DIAGNOSIS — N179 Acute kidney failure, unspecified: Secondary | ICD-10-CM | POA: Diagnosis not present

## 2020-12-13 LAB — COMPREHENSIVE METABOLIC PANEL
ALT: 17 U/L (ref 0–44)
AST: 52 U/L — ABNORMAL HIGH (ref 15–41)
Albumin: 2.1 g/dL — ABNORMAL LOW (ref 3.5–5.0)
Alkaline Phosphatase: 81 U/L (ref 38–126)
Anion gap: 7 (ref 5–15)
BUN: 31 mg/dL — ABNORMAL HIGH (ref 6–20)
CO2: 27 mmol/L (ref 22–32)
Calcium: 7.3 mg/dL — ABNORMAL LOW (ref 8.9–10.3)
Chloride: 98 mmol/L (ref 98–111)
Creatinine, Ser: 2.82 mg/dL — ABNORMAL HIGH (ref 0.44–1.00)
GFR, Estimated: 19 mL/min — ABNORMAL LOW (ref >60)
Glucose, Bld: 436 mg/dL — ABNORMAL HIGH (ref 70–99)
Potassium: 3.9 mmol/L (ref 3.5–5.1)
Sodium: 132 mmol/L — ABNORMAL LOW (ref 135–145)
Total Bilirubin: 1 mg/dL (ref 0.3–1.2)
Total Protein: 5.3 g/dL — ABNORMAL LOW (ref 6.5–8.1)

## 2020-12-13 LAB — LUPUS ANTICOAGULANT PANEL
DRVVT: 37.7 s (ref 0.0–47.0)
PTT Lupus Anticoagulant: 28.2 s (ref 0.0–51.9)

## 2020-12-13 LAB — CBC WITH DIFFERENTIAL/PLATELET
Abs Immature Granulocytes: 0.13 10*3/uL — ABNORMAL HIGH (ref 0.00–0.07)
Basophils Absolute: 0 10*3/uL (ref 0.0–0.1)
Basophils Relative: 0 %
Eosinophils Absolute: 0 10*3/uL (ref 0.0–0.5)
Eosinophils Relative: 0 %
HCT: 22.4 % — ABNORMAL LOW (ref 36.0–46.0)
Hemoglobin: 7.6 g/dL — ABNORMAL LOW (ref 12.0–15.0)
Immature Granulocytes: 2 %
Lymphocytes Relative: 16 %
Lymphs Abs: 1 10*3/uL (ref 0.7–4.0)
MCH: 27.5 pg (ref 26.0–34.0)
MCHC: 33.9 g/dL (ref 30.0–36.0)
MCV: 81.2 fL (ref 80.0–100.0)
Monocytes Absolute: 0.3 10*3/uL (ref 0.1–1.0)
Monocytes Relative: 5 %
Neutro Abs: 4.8 10*3/uL (ref 1.7–7.7)
Neutrophils Relative %: 77 %
Platelets: 30 10*3/uL — ABNORMAL LOW (ref 150–400)
RBC: 2.76 MIL/uL — ABNORMAL LOW (ref 3.87–5.11)
RDW: 16.5 % — ABNORMAL HIGH (ref 11.5–15.5)
WBC: 6.2 10*3/uL (ref 4.0–10.5)
nRBC: 0 % (ref 0.0–0.2)

## 2020-12-13 LAB — GLUCOSE, CAPILLARY
Glucose-Capillary: 398 mg/dL — ABNORMAL HIGH (ref 70–99)
Glucose-Capillary: 441 mg/dL — ABNORMAL HIGH (ref 70–99)
Glucose-Capillary: 298 mg/dL — ABNORMAL HIGH (ref 70–99)
Glucose-Capillary: 220 mg/dL — ABNORMAL HIGH (ref 70–99)

## 2020-12-13 LAB — HEMOGLOBIN A1C
Hgb A1c MFr Bld: 6.8 % — ABNORMAL HIGH (ref 4.8–5.6)
Mean Plasma Glucose: 148.46 mg/dL

## 2020-12-13 LAB — MAGNESIUM: Magnesium: 1.9 mg/dL (ref 1.7–2.4)

## 2020-12-13 MED ORDER — INSULIN ASPART 100 UNIT/ML IJ SOLN
20.0000 [IU] | Freq: Once | INTRAMUSCULAR | Status: AC
  Administered 2020-12-13: 20 [IU] via SUBCUTANEOUS
  Filled 2020-12-13: qty 1

## 2020-12-13 MED ORDER — SODIUM CHLORIDE 0.9% IV SOLUTION: Freq: Once | INTRAVENOUS | Status: AC

## 2020-12-13 MED ORDER — INSULIN GLARGINE-YFGN 100 UNIT/ML ~~LOC~~ SOLN
15.0000 [IU] | Freq: Once | SUBCUTANEOUS | Status: AC
  Administered 2020-12-13: 17:00:00 15 [IU] via SUBCUTANEOUS
  Filled 2020-12-13: qty 0.15

## 2020-12-13 NOTE — Progress Notes (Signed)
      Daily Progress Note   Assessment/Planning:   POD #3 s/p R fem temporary dialysis catheter  Pt does not have active bleeding from [facility] exit site Maximize medical therapy Transfuse plt: pt still hasn't been started on such I don't consider any instrumentation below 50K as all traumatized tissue is going to bleed anyway Other pharmacologic intervention at discretion of Renal Ideally remove the catheter if not needed. Apply QuickClot to exit site or wrap Surgicel around exit site. Change bandages as needed. No interventions planned until medical therapy completed   Subjective  - 3 Days Post-Op   Wondering why kidneys failed   Objective   Vitals:   12/12/20 2034 12/13/20 0402 12/13/20 0757 12/13/20 1148  BP: 136/71 (!) 145/77 138/63 126/66  Pulse: 72 (!) 57 73 71  Resp: 16 16 18 18   Temp: 98.7 F (37.1 C) 98 F (36.7 C) 98.5 F (36.9 C) 99.3 F (37.4 C)  TempSrc:  Oral Oral Oral  SpO2: 97% 97% 98% 100%  Weight:      Height:         Intake/Output Summary (Last 24 hours) at 12/13/2020 1619 Last data filed at 12/13/2020 1300 Gross per 24 hour  Intake 120.77 ml  Output 600 ml  Net -479.23 ml    VASC R groin: venous ooze from exit site, no active bleed    Laboratory   CBC CBC Latest Ref Rng & Units 12/13/2020 12/12/2020 12/11/2020  WBC 4.0 - 10.5 K/uL 6.2 6.1 6.3  Hemoglobin 12.0 - 15.0 g/dL 7.6(L) 7.3(L) 7.7(L)  Hematocrit 36.0 - 46.0 % 22.4(L) 21.9(L) 23.0(L)  Platelets 150 - 400 K/uL 30(L) 30(L) 36(L)    BMET    Component Value Date/Time   NA 132 (L) 12/13/2020 0515   K 3.9 12/13/2020 0515   CL 98 12/13/2020 0515   CO2 27 12/13/2020 0515   GLUCOSE 436 (H) 12/13/2020 0515   BUN 31 (H) 12/13/2020 0515   CREATININE 2.82 (H) 12/13/2020 0515   CALCIUM 7.3 (L) 12/13/2020 0515   GFRNONAA 19 (L) 12/13/2020 0515   GFRAA >60 01/17/2020 Grover, MD, FACS, FSVS Covering for [facility]: [phone]  12/13/2020, 4:19  PM

## 2020-12-13 NOTE — Progress Notes (Signed)
Right femoral catheter dsing with sanguinous drainage noted.  Area cleansed and catheter insertion site evaluated for further bleeding.  New dsing and biopatch applied and informed primary RN to monitor for bleeding.

## 2020-12-13 NOTE — Assessment & Plan Note (Addendum)
-   in remission since 2016 per oncology  - no relapse seen on peripheral smear performed on 11/25/20 "Unremarkable WBC count with unremarkable WBC morphology. Thrombocytopenia with unremarkable platelet morphology. Findings do not support CLL relapse." - no findings on BM biopsy on 8/11 but repeat biopsy was recommended as well: "Trilineage hematopoiesis (limited material)"

## 2020-12-13 NOTE — Progress Notes (Signed)
Progress Note    Tonya Myers   KWI:[id number]  DOB: 10-31-1965  DOA: 11/19/2020     23  PCP: Tonya Pink, MD  Initial CC: AMS  Hospital Course: Tonya Myers is a 55 y.o. female with medical history significant for recent pneumococcal meningitis and bacteremia, depression, hypothyroid, IDDM2 who presented to the ED with AMS.  She underwent extensive work-up after admission with multiple subspecialty consults. Infectious work-up with ID is negative and ID has signed off on 12/09/2020 (see FUO workup below).  She also underwent further work-up with nephrology, oncology, and rheumatology. She has multiple serologies concerning for possible lupus and trying to now obtain renal biopsy to help further diagnose.  Thrombocytopenia has been preventing biopsy obtainment. Due to worsening renal function, nephrology also considering possible initiation of dialysis.  Interval History:  Patient underwent 1st infusion yesterday, 8/19 of Rituxan.  Still oozing some from femoral access for her HD cath; Platelets remain low, Hgb has come up some.  Calling [facility] today to place her on wait list for transfer if able per nephrology recommendations.   ROS: Constitutional: positive for fatigue and malaise, negative for chills and fevers, Respiratory: negative for cough and sputum, Cardiovascular: negative for chest pain, and Gastrointestinal: negative for abdominal pain  Assessment & Plan:  * 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[place]) - Serologic work-up indicative of underlying autoimmune process; concern is Lupus vs vasculitis vs RPGN  - s/p 3 days steroids (8/12 - 8/14, solumedrol 1g daily) - oncology, nephrology, and rheumatology following - tentative plan is renal biopsy as PLTC allows (see thrombocytopenia); nephrology recommending transfer for definitive diagnosis/biopsy and/or treatment with plasmaphresis if needed/indicated - also starting Rituxan on 8/19 while still coordinating renal bx in background (have  been limited by thrombocytopenia)  Thrombocytopenia ([place]) - also see pancytopenia below - PLTC down to 21k on 8/17; no overt bleeding but in efforts to facilitate renal bx will transfuse 2 packs platelets  - PLTC only up to 32k on 8/18; some oozing as expected around trialysis cath; repeat 1 unit Platelet on 8/18 - still low on 8/20; [id number] ordered per nephro on 8/20 - giving 4 units platelets on 8/20 for ongoing oozing   AKI (acute kidney injury) (Tonya Myers) - baseline creat ~0.7 and renal function has continued to deteriorate - nephrology following as well, greatly appreciate assistance - s/p femoral trialysis on 8/17; started 1st HD session on 8/17 as well - continue HD per nephrology  FUO (fever of unknown origin) - fevers had defervesced on 8/12 but then recurred on 8/19 prior to Rituxan and are now defervesced; will trend/watch for now; if persistent will obtain cultures and then decide next steps - ID has followed and signed off on 8/16 (see their note as well)  Controlled type 2 diabetes mellitus without complication, without long-term current use of insulin ([place]) - A1c 6.5% on 10/02/20 - s/p steroids and sounds like CBGs elevated at that time  - remains on SSI as well as prandial coverage and basal insulin - she is at risk for hypoglycemia with that regimen (as has already happened) - now that steroids are complete, will decrease her insulin regimen  - d/c prandial and Semglee - use SSI only for now - solumedrol 100 mg given on 8/19 with Rituxan: CBGs elevated since; will treat acutely but CBGs should improve after steroid washes out as noted above   Personal history of CLL (chronic lymphocytic leukemia) - in remission since 2016 per oncology  - no relapse seen on peripheral  smear performed on 11/25/20 "Unremarkable WBC count with unremarkable WBC morphology. Thrombocytopenia with unremarkable platelet morphology. Findings do not support CLL relapse." - no findings on BM biopsy on  8/11 but repeat biopsy was recommended as well: "Trilineage hematopoiesis (limited material)"  Oral thrush Patient with clinical indications of thrush Associated with difficulty swallowing Patient states poor p.o. intake is driven by appetite rather than pain --s/p Diflucan for 3 doses  Pancytopenia ([place]) - history of CLL as well All 3 cell lines acutely depressed Oncology consulted Patient's CLL has been in remission since 2016 Recurrence of hematologic note malignancy felt unlikely --s/p 1u PRBC 8/5 for Hgb 7.1 and 1 unit on 8/10 - s/p 1u PRBC 8/18  Hypokalemia - Replete and recheck as needed  Hypomagnesemia - Replete and recheck as needed  Acute metabolic encephalopathy Unclear etiology --meningitis ruled out by LP.  CT head neg for new stroke.  Hematology ruled out CLL as the culprit.  No seizure on EEG. --mental status dramatically improved with treatment of pancreatitis and IV thiamine repletion. --MRI brain, no acute finding -- Completed high-dose IV thiamine x3 days  Hyperlipidemia, mixed - Continue statin  Essential hypertension - Continue Cardizem and hydralazine  Hypothyroidism - Continue Synthroid  Pancreatitis-resolved as of 12/11/2020 Unclear etiology No reported history of alcohol intake, no radiographic evidence of gallstones Possible drug-induced Initial abdominal examination negative for tenderness Lipase trending down with IVF, but much slowly than usual. Triglycerides negative - now resolved    Old records reviewed in assessment of this patient  Antimicrobials:   DVT prophylaxis: Place TED hose Start: 11/19/20 2301   Code Status:   Code Status: Full Code Family Communication: Husband  Disposition Plan: Status is: Inpatient  Remains inpatient appropriate because:Inpatient level of care appropriate due to severity of illness  Dispo: The patient is from: Home              Anticipated d/c is to: Home              Patient currently is  not medically stable to d/c.   Difficult to place patient No  Risk of unplanned readmission score: Unplanned Admission- Pilot do not use: 48.47   Objective: Blood pressure 126/66, pulse 71, temperature 99.3 F (37.4 C), temperature source Oral, resp. rate 18, height 6' (1.829 m), weight 94.4 kg, SpO2 100 %.  Examination: General appearance: alert, cooperative, and no distress Head:  face appears slightly oversized Mouth: no thrush noted; Torus Palatinus noted  Eyes:  EOMI Lungs: clear to auscultation bilaterally Heart: regular rate and rhythm and S1, S2 normal Abdomen: normal findings: bowel sounds normal and soft, non-tender Extremities:  no LE edema; swollen joints noted in ankle, wrist; some edema in fingers Skin:  excess skin noted due to weight loss Neurologic: no focal deficits   Consultants:  Oncology/hematology Rheumatology Nephrology ID  Procedures:    Data Reviewed: I have personally reviewed following labs and imaging studies Results for orders placed or performed during the hospital encounter of 11/19/20 (from the past 24 hour(s))  Glucose, capillary     Status: Abnormal   Collection Time: 12/12/20  6:20 PM  Result Value Ref Range   Glucose-Capillary 312 (H) 70 - 99 mg/dL  Glucose, capillary     Status: Abnormal   Collection Time: 12/12/20  8:36 PM  Result Value Ref Range   Glucose-Capillary 375 (H) 70 - 99 mg/dL  CBC with Differential/Platelet     Status: Abnormal   Collection Time: 12/13/20  5:15 AM  Result Value Ref Range   WBC 6.2 4.0 - 10.5 K/uL   RBC 2.76 (L) 3.87 - 5.11 MIL/uL   Hemoglobin 7.6 (L) 12.0 - 15.0 g/dL   HCT 22.4 (L) 36.0 - 46.0 %   MCV 81.2 80.0 - 100.0 fL   MCH 27.5 26.0 - 34.0 pg   MCHC 33.9 30.0 - 36.0 g/dL   RDW 16.5 (H) 11.5 - 15.5 %   Platelets 30 (L) 150 - 400 K/uL   nRBC 0.0 0.0 - 0.2 %   Neutrophils Relative % 77 %   Neutro Abs 4.8 1.7 - 7.7 K/uL   Lymphocytes Relative 16 %   Lymphs Abs 1.0 0.7 - 4.0 K/uL   Monocytes  Relative 5 %   Monocytes Absolute 0.3 0.1 - 1.0 K/uL   Eosinophils Relative 0 %   Eosinophils Absolute 0.0 0.0 - 0.5 K/uL   Basophils Relative 0 %   Basophils Absolute 0.0 0.0 - 0.1 K/uL   Immature Granulocytes 2 %   Abs Immature Granulocytes 0.13 (H) 0.00 - 0.07 K/uL  Comprehensive metabolic panel     Status: Abnormal   Collection Time: 12/13/20  5:15 AM  Result Value Ref Range   Sodium 132 (L) 135 - 145 mmol/L   Potassium 3.9 3.5 - 5.1 mmol/L   Chloride 98 98 - 111 mmol/L   CO2 27 22 - 32 mmol/L   Glucose, Bld 436 (H) 70 - 99 mg/dL   BUN 31 (H) 6 - 20 mg/dL   Creatinine, Ser 2.82 (H) 0.44 - 1.00 mg/dL   Calcium 7.3 (L) 8.9 - 10.3 mg/dL   Total Protein 5.3 (L) 6.5 - 8.1 g/dL   Albumin 2.1 (L) 3.5 - 5.0 g/dL   AST 52 (H) 15 - 41 U/L   ALT 17 0 - 44 U/L   Alkaline Phosphatase 81 38 - 126 U/L   Total Bilirubin 1.0 0.3 - 1.2 mg/dL   GFR, Estimated 19 (L) >60 mL/min   Anion gap 7 5 - 15  Magnesium     Status: None   Collection Time: 12/13/20  5:15 AM  Result Value Ref Range   Magnesium 1.9 1.7 - 2.4 mg/dL  Glucose, capillary     Status: Abnormal   Collection Time: 12/13/20  7:32 AM  Result Value Ref Range   Glucose-Capillary 398 (H) 70 - 99 mg/dL  Glucose, capillary     Status: Abnormal   Collection Time: 12/13/20 11:44 AM  Result Value Ref Range   Glucose-Capillary 441 (H) 70 - 99 mg/dL    Recent Results (from the past 240 hour(s))  CULTURE, BLOOD (ROUTINE X 2) w Reflex to ID Panel     Status: None   Collection Time: 12/06/20 10:35 AM   Specimen: BLOOD  Result Value Ref Range Status   Specimen Description BLOOD LT KNUCKLE  Final   Special Requests   Final    BOTTLES DRAWN AEROBIC AND ANAEROBIC Blood Culture adequate volume   Culture   Final    NO GROWTH 5 DAYS Performed at [facility], [address] [place], [place] [address]    Report Status 12/11/2020 FINAL  Final  CULTURE, BLOOD (ROUTINE X 2) w Reflex to ID Panel     Status: None   Collection Time:  12/06/20 10:35 AM   Specimen: BLOOD  Result Value Ref Range Status   Specimen Description BLOOD BLOOD LEFT WRIST  Final   Special Requests AEROBIC BOTTLE ONLY Blood Culture  adequate volume  Final   Culture   Final    NO GROWTH 5 DAYS Performed at [facility], [address], [place], [place] [address]    Report Status 12/11/2020 FINAL  Final     Radiology Studies: No results found. US Venous Img Upper Bilat (DVT)  Final Result    US Venous Img Lower Bilateral (DVT)  Final Result    DG Chest 1 View  Final Result    CT BONE MARROW BIOPSY  Final Result    US RENAL  Final Result    MR Lumbar Spine W Wo Contrast  Final Result    MR THORACIC SPINE W WO CONTRAST  Final Result    MR CERVICAL SPINE W WO CONTRAST  Final Result    MR BRAIN W WO CONTRAST  Final Result    DG Chest Port 1 View  Final Result    [place] EKG SITE RITE  Final Result    CT HEAD W & WO CONTRAST  Final Result    CT CHEST ABDOMEN PELVIS W CONTRAST  Final Result    DG FL GUIDED LUMBAR PUNCTURE  Final Result    CT Head Wo Contrast  Final Result    DG Chest Portable 1 View  Final Result      Scheduled Meds:  sodium chloride   Intravenous Once   amLODipine  5 mg Oral Daily   Chlorhexidine Gluconate Cloth  6 each Topical Daily   diltiazem  120 mg Oral Daily   feeding supplement  1 Container Oral TID BM   insulin aspart  0-15 Units Subcutaneous TID WC   insulin aspart  0-5 Units Subcutaneous QHS   insulin glargine-yfgn  15 Units Subcutaneous Once   levothyroxine  100 mcg Oral QAC breakfast   magnesium oxide  800 mg Oral BID   mirtazapine  15 mg Oral QHS   multivitamin with minerals  1 tablet Oral Daily   pantoprazole  40 mg Oral Daily   pneumococcal 13-valent conjugate vaccine  0.5 mL Intramuscular Tomorrow-1000   psyllium  1 packet Oral Daily   rosuvastatin  10 mg Oral QHS   sodium bicarbonate  650 mg Oral TID   sodium chloride flush  10-40 mL Intracatheter Q12H    thiamine  100 mg Oral Daily   PRN Meds: acetaminophen **OR** acetaminophen, albuterol, haloperidol lactate, loperamide, ondansetron **OR** ondansetron (ZOFRAN) IV, oxyCODONE, polyethylene glycol, sodium chloride flush Continuous Infusions:   LOS: 23 days  Time spent: Greater than 50% of the 35 minute visit was spent in counseling/coordination of care for the patient as laid out in the A&P.   Dwyane Dee, MD [facility] 12/13/2020, 3:13 PM

## 2020-12-13 NOTE — Progress Notes (Signed)
[facility]  ROUNDING NOTE   Subjective:   Tonya Myers is a 55 y.o. female with medical conditions including pneumococcal meningitis bacteremia, hypothyroidism, Type 2 diabetes, and lymphoma in remission. She presents to the ED with confusion and is admitted for Altered mental status [R41.82] Encephalopathy acute [G93.40] Altered mental status, unspecified altered mental status type [R41.82] Sepsis with encephalopathy without septic shock, due to unspecified organism ([place]) [A41.9, R65.20, G93.40]  Patient resting in bed Alert and oriented Husband and family friend at bedside Allowed to speak freely Tolerating meals Continues to have diarrhea    Objective:  Vital signs in last 24 hours:  Temp:  [98 F (36.7 C)-99.9 F (37.7 C)] 98.5 F (36.9 C) (08/20 0757) Pulse Rate:  [57-74] 73 (08/20 0757) Resp:  [15-20] 18 (08/20 0757) BP: (131-151)/(63-78) 138/63 (08/20 0757) SpO2:  [96 %-100 %] 98 % (08/20 0757)  Weight change:  Filed Weights   12/01/20 0920 12/01/20 1730 12/10/20 1345  Weight: 94.9 kg 94.4 kg 94.4 kg    Intake/Output: I/O last 3 completed shifts: In: 315.6 [I.V.:205.2; IV Piggyback:110.4] Out: 752 [Urine:251; Other:500; Stool:1]   Intake/Output this shift:  Total I/O In: -  Out: 200 [Urine:200]  Physical Exam: General: NAD, laying in bed  Head: Normocephalic, atraumatic. Moist oral mucosal membranes  Eyes: Anicteric  Lungs:  Clear to auscultation, normal effort  Heart: Regular rate and rhythm  Abdomen:  Soft, nontender  Extremities: trace peripheral edema.  Neurologic: Alert, moving all four extremities  Skin: No lesions  Access RUE femoral temp cath placed by Schnier (bloody drainage)    Basic Metabolic Panel: Recent Labs  Lab 12/08/20 0719 12/09/20 0512 12/10/20 0508 12/11/20 0437 12/12/20 0510 12/13/20 0515  NA 136 136 137 138 135 132*  K 4.6 4.7 4.7 4.1 3.8 3.9  CL 110 109 109 104 102 98  CO2 21* 21* 21* 26 29 27    GLUCOSE 394* 257* 132* 74 167* 436*  BUN 56* 67* 75* 49* 32* 31*  CREATININE 3.72* 4.10* 4.42* 3.36* 2.80* 2.82*  CALCIUM 7.9* 7.9* 8.3* 7.4* 7.3* 7.3*  MG 2.0  --   --  1.8 1.8 1.9     Liver Function Tests: Recent Labs  Lab 12/09/20 1020 12/11/20 0437 12/12/20 0510 12/13/20 0515  AST 37 47* 73* 52*  ALT 9 17 26 17   ALKPHOS 72 70 78 81  BILITOT 0.5 0.8 0.9 1.0  PROT 5.5* 5.1* 4.9* 5.3*  ALBUMIN 2.2* 2.1* 2.1* 2.1*    Recent Labs  Lab 12/06/20 1035 12/07/20 0511  LIPASE 90* 135*    No results for input(s): AMMONIA in the last 168 hours.  CBC: Recent Labs  Lab 12/09/20 0512 12/10/20 0508 12/11/20 0437 12/11/20 1835 12/12/20 0510 12/13/20 0515  WBC 8.9 8.0 6.4 6.3 6.1 6.2  NEUTROABS 7.0 5.7 4.8  --  4.5 4.8  HGB 7.4* 7.4* 6.7* 7.7* 7.3* 7.6*  HCT 21.9* 22.3* 20.1* 23.0* 21.9* 22.4*  MCV 80.5 80.8 82.0 81.3 80.5 81.2  PLT 28* 21* 32* 36* 30* 30*     Cardiac Enzymes: No results for input(s): CKTOTAL, CKMB, CKMBINDEX, TROPONINI in the last 168 hours.   BNP: Invalid input(s): POCBNP  CBG: Recent Labs  Lab 12/11/20 2140 12/12/20 0752 12/12/20 1820 12/12/20 2036 12/13/20 0732  GLUCAP 207* 142* 312* 375* 34*     Microbiology: Results for orders placed or performed during the hospital encounter of 11/19/20  Blood culture (routine x 2)     Status: None  Collection Time: 11/19/20  4:47 PM   Specimen: BLOOD  Result Value Ref Range Status   Specimen Description BLOOD RIGHT ANTECUBITAL  Final   Special Requests   Final    BOTTLES DRAWN AEROBIC AND ANAEROBIC Blood Culture adequate volume   Culture   Final    NO GROWTH 5 DAYS Performed at [facility], [address] [place], [place] [address]    Report Status 11/24/2020 FINAL  Final  Resp Panel by RT-PCR (Flu A&B, Covid) Nasopharyngeal Swab     Status: None   Collection Time: 11/19/20  4:48 PM   Specimen: Nasopharyngeal Swab; Nasopharyngeal(NP) swabs in vial transport medium  Result  Value Ref Range Status   SARS Coronavirus 2 by RT PCR NEGATIVE NEGATIVE Final    Comment: (NOTE) SARS-CoV-2 target nucleic acids are NOT DETECTED.  The SARS-CoV-2 RNA is generally detectable in upper respiratory specimens during the acute phase of infection. The lowest concentration of SARS-CoV-2 viral copies this assay can detect is 138 copies/mL. A negative result does not preclude SARS-Cov-2 infection and should not be used as the sole basis for treatment or other patient management decisions. A negative result may occur with  improper specimen collection/handling, submission of specimen other than nasopharyngeal swab, presence of viral mutation(s) within the areas targeted by this assay, and inadequate number of viral copies(<138 copies/mL). A negative result must be combined with clinical observations, patient history, and epidemiological information. The expected result is Negative.  Fact Sheet for Patients:  EntrepreneurPulse.com.au  Fact Sheet for Healthcare Providers:  IncredibleEmployment.be  This test is no t yet approved or cleared by the [place] FDA and  has been authorized for detection and/or diagnosis of SARS-CoV-2 by FDA under an Emergency Use Authorization (EUA). This EUA will remain  in effect (meaning this test can be used) for the duration of the COVID-19 declaration under Section 564(b)(1) of the Act, 21 U.S.C.section 360bbb-3(b)(1), unless the authorization is terminated  or revoked sooner.       Influenza A by PCR NEGATIVE NEGATIVE Final   Influenza B by PCR NEGATIVE NEGATIVE Final    Comment: (NOTE) The Xpert Xpress SARS-CoV-2/FLU/RSV plus assay is intended as an aid in the diagnosis of influenza from Nasopharyngeal swab specimens and should not be used as a sole basis for treatment. Nasal washings and aspirates are unacceptable for Xpert Xpress SARS-CoV-2/FLU/RSV testing.  Fact Sheet for  Patients: EntrepreneurPulse.com.au  Fact Sheet for Healthcare Providers: IncredibleEmployment.be  This test is not yet approved or cleared by the [place] FDA and has been authorized for detection and/or diagnosis of SARS-CoV-2 by FDA under an Emergency Use Authorization (EUA). This EUA will remain in effect (meaning this test can be used) for the duration of the COVID-19 declaration under Section 564(b)(1) of the Act, 21 U.S.C. section 360bbb-3(b)(1), unless the authorization is terminated or revoked.  Performed at [facility], [address] [place], [place] [address]   Blood culture (routine x 2)     Status: None   Collection Time: 11/19/20  6:46 PM   Specimen: BLOOD  Result Value Ref Range Status   Specimen Description BLOOD RIGHT ANTECUBITAL  Final   Special Requests   Final    BOTTLES DRAWN AEROBIC AND ANAEROBIC Blood Culture adequate volume   Culture   Final    NO GROWTH 5 DAYS Performed at [facility], [address] [place], [place] [address]    Report Status 11/24/2020 FINAL  Final  Urine Culture  Status: Abnormal   Collection Time: 11/19/20  9:33 PM   Specimen: Urine, Clean Catch  Result Value Ref Range Status   Specimen Description   Final    URINE, CLEAN CATCH Performed at [facility], [address], [place], [place] [address]    Special Requests   Final    NONE Performed at [facility], [address] [place], [place] [address]    Culture (A)  Final    <10,000 COLONIES/mL INSIGNIFICANT GROWTH Performed at [facility] [address], [place], [place] [address]    Report Status 11/21/2020 FINAL  Final  Culture, fungus without smear     Status: None   Collection Time: 11/20/20 11:09 AM   Specimen: CSF; Cerebrospinal Fluid  Result Value Ref Range Status   Specimen Description   Final    CSF Performed at [facility], [address],  [place], [place] [address]    Special Requests   Final    NONE Performed at [facility], [address], [place], [place] [address]    Culture   Final    NO FUNGUS ISOLATED AFTER 21 DAYS Performed at [facility], [place] [address], [place], [place] [address]    Report Status 12/11/2020 FINAL  Final  CSF culture w Gram Stain     Status: None   Collection Time: 11/20/20 11:09 AM   Specimen: PATH Cytology CSF; Cerebrospinal Fluid  Result Value Ref Range Status   Specimen Description   Final    CSF Performed at [facility], [address] [place], [place] [address]    Special Requests   Final    NONE Performed at [facility], [address] [place], [place] [address]    Gram Stain   Final    NO ORGANISMS SEEN WBC SEEN RED BLOOD CELLS PRESENT Performed at [facility], [address] [place], [place] [address]    Culture   Final    NO GROWTH 3 DAYS Performed at [facility], [place] [address] [place], [place] [address]    Report Status 11/23/2020 FINAL  Final  Fungus Culture With Stain     Status: None (Preliminary result)   Collection Time: 11/20/20 11:09 AM   Specimen: PATH Cytology CSF; Cerebrospinal Fluid  Result Value Ref Range Status   Fungus Stain Final report  Final    Comment: (NOTE) Performed At: [facility] [place], [place] [id number] Rush Farmer MD [id number]    Fungus (Mycology) Culture PENDING  Incomplete   Fungal Source CSF  Final    Comment: Performed at [facility], [address] [place],  [address]  Fungus Culture Result     Status: None   Collection Time: 11/20/20 11:09 AM  Result Value Ref Range Status   Result 1 Comment  Final    Comment: (NOTE) KOH/Calcofluor preparation:  no fungus observed. Performed At: [facility] [place], [place] [id number] Rush Farmer MD [id number]   Resp Panel by RT-PCR (Flu A&B, Covid)  Nasopharyngeal Swab     Status: None   Collection Time: 11/27/20  1:39 PM   Specimen: Nasopharyngeal Swab; Nasopharyngeal(NP) swabs in vial transport medium  Result Value Ref Range Status   SARS Coronavirus 2 by RT PCR NEGATIVE NEGATIVE Final    Comment: (NOTE) SARS-CoV-2 target nucleic acids are NOT DETECTED.  The SARS-CoV-2 RNA is generally detectable in upper respiratory specimens during the  acute phase of infection. The lowest concentration of SARS-CoV-2 viral copies this assay can detect is 138 copies/mL. A negative result does not preclude SARS-Cov-2 infection and should not be used as the sole basis for treatment or other patient management decisions. A negative result may occur with  improper specimen collection/handling, submission of specimen other than nasopharyngeal swab, presence of viral mutation(s) within the areas targeted by this assay, and inadequate number of viral copies(<138 copies/mL). A negative result must be combined with clinical observations, patient history, and epidemiological information. The expected result is Negative.  Fact Sheet for Patients:  EntrepreneurPulse.com.au  Fact Sheet for Healthcare Providers:  IncredibleEmployment.be  This test is no t yet approved or cleared by the [place] FDA and  has been authorized for detection and/or diagnosis of SARS-CoV-2 by FDA under an Emergency Use Authorization (EUA). This EUA will remain  in effect (meaning this test can be used) for the duration of the COVID-19 declaration under Section 564(b)(1) of the Act, 21 U.S.C.section 360bbb-3(b)(1), unless the authorization is terminated  or revoked sooner.       Influenza A by PCR NEGATIVE NEGATIVE Final   Influenza B by PCR NEGATIVE NEGATIVE Final    Comment: (NOTE) The Xpert Xpress SARS-CoV-2/FLU/RSV plus assay is intended as an aid in the diagnosis of influenza from Nasopharyngeal swab specimens and should not be  used as a sole basis for treatment. Nasal washings and aspirates are unacceptable for Xpert Xpress SARS-CoV-2/FLU/RSV testing.  Fact Sheet for Patients: EntrepreneurPulse.com.au  Fact Sheet for Healthcare Providers: IncredibleEmployment.be  This test is not yet approved or cleared by the [place] FDA and has been authorized for detection and/or diagnosis of SARS-CoV-2 by FDA under an Emergency Use Authorization (EUA). This EUA will remain in effect (meaning this test can be used) for the duration of the COVID-19 declaration under Section 564(b)(1) of the Act, 21 U.S.C. section 360bbb-3(b)(1), unless the authorization is terminated or revoked.  Performed at [facility], [address] [place], [place] [address]   Urine Culture     Status: Abnormal   Collection Time: 11/27/20  2:15 PM   Specimen: Urine, Clean Catch  Result Value Ref Range Status   Specimen Description   Final    URINE, CLEAN CATCH Performed at [facility], [address], [place], [place] [address]    Special Requests   Final    NONE Performed at [facility], [place], [place] [address]    Culture 20,000 COLONIES/mL ENTEROBACTER AEROGENES (A)  Final   Report Status 11/30/2020 FINAL  Final   Organism ID, Bacteria ENTEROBACTER AEROGENES (A)  Final      Susceptibility   Enterobacter aerogenes - MIC*    CEFAZOLIN >=64 RESISTANT Resistant     CEFEPIME 1 SENSITIVE Sensitive     CEFTRIAXONE >=64 RESISTANT Resistant     CIPROFLOXACIN <=0.25 SENSITIVE Sensitive     GENTAMICIN <=1 SENSITIVE Sensitive     IMIPENEM <=0.25 SENSITIVE Sensitive     NITROFURANTOIN 64 INTERMEDIATE Intermediate     TRIMETH/SULFA <=20 SENSITIVE Sensitive     PIP/TAZO >=128 RESISTANT Resistant     * 20,000 COLONIES/mL ENTEROBACTER AEROGENES  CULTURE, BLOOD (ROUTINE X 2) w Reflex to ID Panel     Status: None   Collection Time: 11/27/20  8:23 PM    Specimen: BLOOD  Result Value Ref Range Status   Specimen Description BLOOD LAC  Final   Special Requests   Final  BOTTLES DRAWN AEROBIC AND ANAEROBIC Blood Culture results may not be optimal due to an inadequate volume of blood received in culture bottles   Culture   Final    NO GROWTH 5 DAYS Performed at [facility], [address] [place], [place] [address]    Report Status 12/02/2020 FINAL  Final  CULTURE, BLOOD (ROUTINE X 2) w Reflex to ID Panel     Status: None   Collection Time: 11/27/20  9:32 PM   Specimen: BLOOD LEFT HAND  Result Value Ref Range Status   Specimen Description BLOOD LEFT HAND  Final   Special Requests   Final    BOTTLES DRAWN AEROBIC AND ANAEROBIC Blood Culture adequate volume   Culture   Final    NO GROWTH 5 DAYS Performed at [facility], [address] [place], [place] [address]    Report Status 12/02/2020 FINAL  Final  CULTURE, BLOOD (ROUTINE X 2) w Reflex to ID Panel     Status: None   Collection Time: 12/06/20 10:35 AM   Specimen: BLOOD  Result Value Ref Range Status   Specimen Description BLOOD LT KNUCKLE  Final   Special Requests   Final    BOTTLES DRAWN AEROBIC AND ANAEROBIC Blood Culture adequate volume   Culture   Final    NO GROWTH 5 DAYS Performed at [facility], [address] [place], [place] [address]    Report Status 12/11/2020 FINAL  Final  CULTURE, BLOOD (ROUTINE X 2) w Reflex to ID Panel     Status: None   Collection Time: 12/06/20 10:35 AM   Specimen: BLOOD  Result Value Ref Range Status   Specimen Description BLOOD BLOOD LEFT WRIST  Final   Special Requests AEROBIC BOTTLE ONLY Blood Culture adequate volume  Final   Culture   Final    NO GROWTH 5 DAYS Performed at [facility], [address], [place], [place] [address]    Report Status 12/11/2020 FINAL  Final    Coagulation Studies: No results for input(s): LABPROT, INR in the last 72 hours.   Urinalysis: No results for  input(s): COLORURINE, LABSPEC, PHURINE, GLUCOSEU, HGBUR, BILIRUBINUR, KETONESUR, PROTEINUR, UROBILINOGEN, NITRITE, LEUKOCYTESUR in the last 72 hours.  Invalid input(s): APPERANCEUR     Imaging: No results found.   Medications:       sodium chloride   Intravenous Once   amLODipine  5 mg Oral Daily   Chlorhexidine Gluconate Cloth  6 each Topical Daily   diltiazem  120 mg Oral Daily   feeding supplement  1 Container Oral TID BM   insulin aspart  0-15 Units Subcutaneous TID WC   insulin aspart  0-5 Units Subcutaneous QHS   levothyroxine  100 mcg Oral QAC breakfast   magnesium oxide  800 mg Oral BID   mirtazapine  15 mg Oral QHS   multivitamin with minerals  1 tablet Oral Daily   pantoprazole  40 mg Oral Daily   pneumococcal 13-valent conjugate vaccine  0.5 mL Intramuscular Tomorrow-1000   psyllium  1 packet Oral Daily   rosuvastatin  10 mg Oral QHS   sodium bicarbonate  650 mg Oral TID   sodium chloride flush  10-40 mL Intracatheter Q12H   thiamine  100 mg Oral Daily   acetaminophen **OR** acetaminophen, albuterol, haloperidol lactate, loperamide, ondansetron **OR** ondansetron (ZOFRAN) IV, oxyCODONE, polyethylene glycol, sodium chloride flush  Assessment/ Plan:  [name] is a 54 y.o.  female with medical conditions including  pneumococcal meningitis bacteremia, hypothyroidism, Type 2 diabetes, and lymphoma in remission. She presents to the ED with confusion and is admitted for Altered mental status [R41.82] Encephalopathy acute [G93.40] Altered mental status, unspecified altered mental status type [R41.82] Sepsis with encephalopathy without septic shock, due to unspecified organism ([place]) [A41.9, R65.20, G93.40]   Acute Kidney Injury/microscopic hematuria/ proteinuria with baseline creatinine 0.49 on 11/22/20.  Acute kidney injury secondary to contrast induced ATN. IV contrast exposure on November 21, 2020.  UA shows protienuria, >50 RBCs, 21-50 WBC, microbiology 20k  colonies- Enterobacter. Renal ultrasound negative for mass and obstruction. Questionable small urterocele. No acute need for dialysis at this time Complements decreased and ANCA positive. Due to conflicting results, will retest for ANCA and other labs to verify results. Labs pending Radiologist unwilling to proceed with renal biopsy with increased bleeding risk. After discussing with team, decision was made to proceed with rituxan empirically until renal biopsy can be performed. Continue platelet transfusions.  - Received dialysis yesterday, UF 520m acheived  - Plan to hold dialysis this weekend  - Discussed with patient the possible need to transfer to another hospital to pursue other therapies. We will contact other facilities to discuss this patient's case and determine if  hospital transfer is needed.  - Will continue to monitor closely  Lab Results  Component Value Date   CREATININE 2.82 (H) 12/13/2020   CREATININE 2.80 (H) 12/12/2020   CREATININE 3.36 (H) 12/11/2020    Intake/Output Summary (Last 24 hours) at 12/13/2020 1022 Last data filed at 12/13/2020 0951 Gross per 24 hour  Intake 315.63 ml  Output 400 ml  Net -84.37 ml    2. Anemia  with history of CLL pancytopenia Lab Results  Component Value Date   HGB 7.6 (L) 12/13/2020   Bone marrow biopsy 12/04/20, limited marrow material with trilineage hematopoiesis. Recommending repeat bone marrow biopsy Oncology following    LOS: 2[place]8/20/202210:22 AM

## 2020-12-14 DIAGNOSIS — D8989 Other specified disorders involving the immune mechanism, not elsewhere classified: Secondary | ICD-10-CM | POA: Diagnosis not present

## 2020-12-14 LAB — CBC WITH DIFFERENTIAL/PLATELET
Abs Immature Granulocytes: 0.09 10*3/uL — ABNORMAL HIGH (ref 0.00–0.07)
Abs Immature Granulocytes: 0.11 10*3/uL — ABNORMAL HIGH (ref 0.00–0.07)
Basophils Absolute: 0 10*3/uL (ref 0.0–0.1)
Basophils Absolute: 0 10*3/uL (ref 0.0–0.1)
Basophils Relative: 0 %
Basophils Relative: 0 %
Eosinophils Absolute: 0.2 10*3/uL (ref 0.0–0.5)
Eosinophils Absolute: 0.1 10*3/uL (ref 0.0–0.5)
Eosinophils Relative: 2 %
Eosinophils Relative: 1 %
HCT: 20.3 % — ABNORMAL LOW (ref 36.0–46.0)
HCT: 23.1 % — ABNORMAL LOW (ref 36.0–46.0)
Hemoglobin: 6.8 g/dL — ABNORMAL LOW (ref 12.0–15.0)
Hemoglobin: 7.8 g/dL — ABNORMAL LOW (ref 12.0–15.0)
Immature Granulocytes: 1 %
Immature Granulocytes: 2 %
Lymphocytes Relative: 19 %
Lymphocytes Relative: 17 %
Lymphs Abs: 1.5 10*3/uL (ref 0.7–4.0)
Lymphs Abs: 1.1 10*3/uL (ref 0.7–4.0)
MCH: 27.5 pg (ref 26.0–34.0)
MCH: 27.9 pg (ref 26.0–34.0)
MCHC: 33.5 g/dL (ref 30.0–36.0)
MCHC: 33.8 g/dL (ref 30.0–36.0)
MCV: 82.2 fL (ref 80.0–100.0)
MCV: 82.5 fL (ref 80.0–100.0)
Monocytes Absolute: 0.4 10*3/uL (ref 0.1–1.0)
Monocytes Absolute: 0.3 10*3/uL (ref 0.1–1.0)
Monocytes Relative: 5 %
Monocytes Relative: 5 %
Neutro Abs: 5.9 10*3/uL (ref 1.7–7.7)
Neutro Abs: 4.8 10*3/uL (ref 1.7–7.7)
Neutrophils Relative %: 73 %
Neutrophils Relative %: 75 %
Platelets: 62 10*3/uL — ABNORMAL LOW (ref 150–400)
Platelets: 44 10*3/uL — ABNORMAL LOW (ref 150–400)
RBC: 2.47 MIL/uL — ABNORMAL LOW (ref 3.87–5.11)
RBC: 2.8 MIL/uL — ABNORMAL LOW (ref 3.87–5.11)
RDW: 16.7 % — ABNORMAL HIGH (ref 11.5–15.5)
RDW: 15.4 % (ref 11.5–15.5)
WBC: 8 10*3/uL (ref 4.0–10.5)
WBC: 6.4 10*3/uL (ref 4.0–10.5)
nRBC: 0 % (ref 0.0–0.2)
nRBC: 0 % (ref 0.0–0.2)

## 2020-12-14 LAB — BPAM PLATELET PHERESIS
Blood Product Expiration Date: 202208212359
Blood Product Expiration Date: 202208222359
ISSUE DATE / TIME: 202208201738
ISSUE DATE / TIME: 202208202058
Unit Type and Rh: 6200
Unit Type and Rh: 7300

## 2020-12-14 LAB — PREPARE PLATELET PHERESIS
Unit division: 0
Unit division: 0

## 2020-12-14 LAB — COMPREHENSIVE METABOLIC PANEL
ALT: 12 U/L (ref 0–44)
AST: 35 U/L (ref 15–41)
Albumin: 2.1 g/dL — ABNORMAL LOW (ref 3.5–5.0)
Alkaline Phosphatase: 74 U/L (ref 38–126)
Anion gap: 6 (ref 5–15)
BUN: 37 mg/dL — ABNORMAL HIGH (ref 6–20)
CO2: 29 mmol/L (ref 22–32)
Calcium: 7.4 mg/dL — ABNORMAL LOW (ref 8.9–10.3)
Chloride: 101 mmol/L (ref 98–111)
Creatinine, Ser: 3.64 mg/dL — ABNORMAL HIGH (ref 0.44–1.00)
GFR, Estimated: 14 mL/min — ABNORMAL LOW (ref >60)
Glucose, Bld: 122 mg/dL — ABNORMAL HIGH (ref 70–99)
Potassium: 3.3 mmol/L — ABNORMAL LOW (ref 3.5–5.1)
Sodium: 136 mmol/L (ref 135–145)
Total Bilirubin: 0.6 mg/dL (ref 0.3–1.2)
Total Protein: 5.3 g/dL — ABNORMAL LOW (ref 6.5–8.1)

## 2020-12-14 LAB — MAGNESIUM: Magnesium: 1.9 mg/dL (ref 1.7–2.4)

## 2020-12-14 LAB — GLUCOSE, CAPILLARY
Glucose-Capillary: 120 mg/dL — ABNORMAL HIGH (ref 70–99)
Glucose-Capillary: 260 mg/dL — ABNORMAL HIGH (ref 70–99)
Glucose-Capillary: 220 mg/dL — ABNORMAL HIGH (ref 70–99)
Glucose-Capillary: 168 mg/dL — ABNORMAL HIGH (ref 70–99)

## 2020-12-14 LAB — PLATELET COUNT: Platelets: 81 10*3/uL — ABNORMAL LOW (ref 150–400)

## 2020-12-14 LAB — PREPARE RBC (CROSSMATCH)

## 2020-12-14 MED ORDER — SODIUM CHLORIDE 0.9% IV SOLUTION: Freq: Once | INTRAVENOUS | Status: AC

## 2020-12-14 MED ORDER — POTASSIUM CHLORIDE CRYS ER 20 MEQ PO TBCR
40.0000 meq | EXTENDED_RELEASE_TABLET | Freq: Once | ORAL | Status: AC
  Administered 2020-12-14: 40 meq via ORAL
  Filled 2020-12-14: qty 2

## 2020-12-14 NOTE — Progress Notes (Signed)
   12/14/20 1129  Assess: MEWS Score  Temp (!) 102.3 F (39.1 C)  BP 132/68  Pulse Rate 84  Resp 17  Assess: MEWS Score  MEWS Temp 2  MEWS Systolic 0  MEWS Pulse 0  MEWS RR 0  MEWS LOC 0  MEWS Score 2  MEWS Score Color Yellow  Assess: if the MEWS score is Yellow or Red  Were vital signs taken at a resting state? Yes  Focused Assessment No change from prior assessment  Does the patient meet 2 or more of the SIRS criteria? No  Does the patient have a confirmed or suspected source of infection? No  Provider and Rapid Response Notified? Yes  MEWS guidelines implemented *See Row Information* Yes  Treat  MEWS Interventions Administered prn meds/treatments  Pain Scale 0-10  Pain Score 0  Take Vital Signs  Increase Vital Sign Frequency  Yellow: Q 2hr X 2 then Q 4hr X 2, if remains yellow, continue Q 4hrs  Escalate  MEWS: Escalate Yellow: discuss with charge nurse/RN and consider discussing with provider and RRT  Notify: Charge Nurse/RN  Name of Charge Nurse/RN Notified Jo RN  Date Charge Nurse/RN Notified 12/14/20  Time Charge Nurse/RN Notified 1129  Notify: Provider  Provider Name/Title Dwyane Dee  Date Provider Notified 12/14/20  Time Provider Notified 1133  Notification Type Page  Notification Reason Critical result  Provider response No new orders  Date of Provider Response 12/14/20  Time of Provider Response 1148  Document  Patient Outcome Stabilized after interventions  Progress note created (see row info) Yes  Assess: SIRS CRITERIA  SIRS Temperature  1  SIRS Pulse 0  SIRS Respirations  0  SIRS WBC 0  SIRS Score Sum  1  Notified Dr. Sabino Gasser of elevated temp 102.3. Post 15 minute vital sign check of blood transfusion. Per MD medicate patient with PRN tylenol, and continue blood transfusion. Per Dr. Sabino Gasser no suspected transfusion reaction as patient has had intermittent fevers. Will continue to monitor and implement yellow MEWS protocol.

## 2020-12-14 NOTE — Progress Notes (Signed)
Right femoral catheter with sanguinous drainage, area cleansed and Surgicel wrapped around exit site as per MD.  Primary RN aware and will continue to monitor for drainage.  Patient alert and oriented and offers no complaints

## 2020-12-14 NOTE — Progress Notes (Addendum)
Patient alert and oriented. Patient has order for 4 units of platelets, patient received 2 units, platelet count now 80, per blood bank they do not issue platelet unless it's 50 or less. Blood bank supervisor was notified per blood bank,. Will call unit back after blood bank supervisor return call. Hospitalist notified, Dr. Damita Dunnings ( hospitalist) states patient do not need additional units at this time, Will continue to monitor.

## 2020-12-14 NOTE — Progress Notes (Signed)
   12/14/20 2003  Assess: MEWS Score  Temp (!) 101.8 F (38.8 C)  BP (!) 148/76  Pulse Rate 84  Resp 18  SpO2 97 %  O2 Device Room Air  Assess: MEWS Score  MEWS Temp 2  MEWS Systolic 0  MEWS Pulse 0  MEWS RR 0  MEWS LOC 0  MEWS Score 2  MEWS Score Color Yellow  Assess: if the MEWS score is Yellow or Red  Were vital signs taken at a resting state? Yes  Focused Assessment No change from prior assessment  Does the patient meet 2 or more of the SIRS criteria? No  Does the patient have a confirmed or suspected source of infection? No  Provider and Rapid Response Notified? No  MEWS guidelines implemented *See Row Information* Yes  Treat  MEWS Interventions Administered prn meds/treatments  Pain Score 0  Take Vital Signs  Increase Vital Sign Frequency  Yellow: Q 2hr X 2 then Q 4hr X 2, if remains yellow, continue Q 4hrs  Escalate  MEWS: Escalate Yellow: discuss with charge nurse/RN and consider discussing with provider and RRT  Notify: Charge Nurse/RN  Name of Charge Nurse/RN Notified Orvil Feil, RN  Date Charge Nurse/RN Notified 12/14/20  Time Charge Nurse/RN Notified 2003  Document  Patient Outcome Stabilized after interventions  Progress note created (see row info) Yes  Assess: SIRS CRITERIA  SIRS Temperature  1  SIRS Pulse 0  SIRS Respirations  0  SIRS WBC 0  SIRS Score Sum  1  Patient alert and oriented, no complaints of pain, Temp 101.8, PRN tylenol given with improvement, will continue to monitor.

## 2020-12-14 NOTE — Progress Notes (Signed)
Progress Note    Tonya Myers   YFV:[id number]  DOB: May 13, 1965  DOA: 11/19/2020     24  PCP: [name], MD  Initial CC: AMS  Hospital Course: [name] is a 55 y.o. female with medical history significant for recent pneumococcal meningitis and bacteremia, depression, hypothyroid, IDDM2 who presented to the ED with AMS.  She underwent extensive work-up after admission with multiple subspecialty consults. Infectious work-up with ID is negative and ID has signed off on 12/09/2020 (see FUO workup below).  She also underwent further work-up with nephrology, oncology, and rheumatology. She has multiple serologies concerning for possible lupus and trying to now obtain renal biopsy to help further diagnose.  Thrombocytopenia has been preventing biopsy obtainment. Due to worsening renal function, nephrology also considering possible initiation of dialysis.  Interval History:  No events overnight.  Still some blood noted underneath dressing of trialysis catheter but reassured her this is expected with her blood counts. [facility] did not accept patient after phone call yesterday.  They recommended trying to obtain renal biopsy first and if plasmapheresis indicated, she would be an acceptable candidate. Updated patient and family member in room today.  ROS: Constitutional: positive for fatigue and malaise, negative for chills and fevers, Respiratory: negative for cough and sputum, Cardiovascular: negative for chest pain, and Gastrointestinal: negative for abdominal pain  Assessment & Plan:  * 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[place]) - Serologic work-up indicative of underlying autoimmune process; concern is Lupus vs vasculitis vs RPGN  - s/p 3 days steroids (8/12 - 8/14, solumedrol 1g daily) - oncology, nephrology, and rheumatology following - tentative plan is renal biopsy as PLTC allows (see thrombocytopenia); nephrology recommending transfer for definitive diagnosis/biopsy and/or treatment with  plasmaphresis if needed/indicated - also starting Rituxan on 8/19 while still coordinating renal bx in background (have been limited by thrombocytopenia)  Thrombocytopenia ([name]) - also see pancytopenia below - PLTC down to 21k on 8/17; no overt bleeding but in efforts to facilitate renal bx will transfuse 2 packs platelets  - PLTC only up to 32k on 8/18; some oozing as expected around trialysis cath; repeat 1 unit Platelet on 8/18 - still low on 8/20; [id number] ordered per nephro on 8/20 - s/p 2 units Platelets on 8/20 - follow up PLTC in am and trying to target PLTC>50  AKI (acute kidney injury) ([place]) - baseline creat ~0.7 and renal function has continued to deteriorate - nephrology following as well, greatly appreciate assistance - s/p femoral trialysis on 8/17; started 1st HD session on 8/17 as well - continue HD per nephrology  FUO (fever of unknown origin) - ID has followed and signed off on 8/16 (see their note as well) - fevers had defervesced on 8/12 but then recurred on 8/19 prior to Rituxan and then defervesced some; again having fevers on 8/21 - continue trending fever curve; unclear etiology given negative infectious workup recently this may be related to underlying autoimmune process as well  Controlled type 2 diabetes mellitus without complication, without long-term current use of insulin (Tonya Myers) - A1c 6.5% on 10/02/20 - s/p steroids and sounds like CBGs elevated at that time  - remains on SSI as well as prandial coverage and basal insulin - she is at risk for hypoglycemia with that regimen (as has already happened) - now that steroids are complete, will decrease her insulin regimen  - d/c prandial and Semglee - use SSI only for now - solumedrol 100 mg given on 8/19 with Rituxan: CBGs elevated since; will treat acutely  but CBGs should improve after steroid washes out as noted above   Personal history of CLL (chronic lymphocytic leukemia) - in remission since 2016 per  oncology  - no relapse seen on peripheral smear performed on 11/25/20 "Unremarkable WBC count with unremarkable WBC morphology. Thrombocytopenia with unremarkable platelet morphology. Findings do not support CLL relapse." - no findings on BM biopsy on 8/11 but repeat biopsy was recommended as well: "Trilineage hematopoiesis (limited material)"  Oral thrush Patient with clinical indications of thrush Associated with difficulty swallowing Patient states poor p.o. intake is driven by appetite rather than pain --s/p Diflucan for 3 doses  Pancytopenia ([name]) - history of CLL as well All 3 cell lines acutely depressed Oncology consulted Patient's CLL has been in remission since 2016 Recurrence of hematologic note malignancy felt unlikely --s/p 1u PRBC 8/5  - s/p 1u PRBC 8/10 - s/p 1u PRBC 8/18 - s/p 1u PRBC 8/21  Hypokalemia - Replete and recheck as needed  Hypomagnesemia - Replete and recheck as needed  Acute metabolic encephalopathy Unclear etiology --meningitis ruled out by LP.  CT head neg for new stroke.  Hematology ruled out CLL as the culprit.  No seizure on EEG. --mental status dramatically improved with treatment of pancreatitis and IV thiamine repletion. --MRI brain, no acute finding -- Completed high-dose IV thiamine x3 days  Hyperlipidemia, mixed - Continue statin  Essential hypertension - Continue Cardizem and hydralazine  Hypothyroidism - Continue Synthroid  Pancreatitis-resolved as of 12/11/2020 Unclear etiology No reported history of alcohol intake, no radiographic evidence of gallstones Possible drug-induced Initial abdominal examination negative for tenderness Lipase trending down with IVF, but much slowly than usual. Triglycerides negative - now resolved    Old records reviewed in assessment of this patient  Antimicrobials:   DVT prophylaxis: Place TED hose Start: 11/19/20 2301   Code Status:   Code Status: Full Code Family Communication:  Husband  Disposition Plan: Status is: Inpatient  Remains inpatient appropriate because:Inpatient level of care appropriate due to severity of illness  Dispo: The patient is from: Home              Anticipated d/c is to: Home              Patient currently is not medically stable to d/c.   Difficult to place patient No  Risk of unplanned readmission score: Unplanned Admission- Pilot do not use: 48.28   Objective: Blood pressure 125/72, pulse 86, temperature (!) 101.6 F (38.7 C), temperature source Oral, resp. rate 18, height 6' (1.829 m), weight 94.4 kg, SpO2 98 %.  Examination: General appearance: alert, cooperative, and no distress Head:  face appears slightly oversized Mouth: no thrush noted; Torus Palatinus noted  Eyes:  EOMI Lungs: clear to auscultation bilaterally Heart: regular rate and rhythm and S1, S2 normal Abdomen: normal findings: bowel sounds normal and soft, non-tender Extremities:  no LE edema; swollen joints noted in ankle, wrist; some edema in fingers Skin:  excess skin noted due to weight loss Neurologic: no focal deficits   Consultants:  Oncology/hematology Rheumatology Nephrology ID  Procedures:    Data Reviewed: I have personally reviewed following labs and imaging studies Results for orders placed or performed during the hospital encounter of 11/19/20 (from the past 24 hour(s))  Prepare Pheresed Platelets     Status: None   Collection Time: 12/13/20  2:48 PM  Result Value Ref Range   Unit Number [id number]    Blood Component Type PLTP1 PSORALEN TREATED  Unit division 00    Status of Unit ISSUED,FINAL    Transfusion Status OK TO TRANSFUSE    Unit Number [id number]    Blood Component Type PLTP2 PSORALEN TREATED    Unit division 00    Status of Unit ISSUED,FINAL    Transfusion Status      OK TO TRANSFUSE Performed at [facility], [address] [place], [place] [address]   Glucose, capillary     Status: Abnormal    Collection Time: 12/13/20  4:39 PM  Result Value Ref Range   Glucose-Capillary 298 (H) 70 - 99 mg/dL  Glucose, capillary     Status: Abnormal   Collection Time: 12/13/20  8:26 PM  Result Value Ref Range   Glucose-Capillary 220 (H) 70 - 99 mg/dL  Platelet count     Status: Abnormal   Collection Time: 12/14/20 12:08 AM  Result Value Ref Range   Platelets 81 (L) 150 - 400 K/uL  CBC with Differential/Platelet     Status: Abnormal   Collection Time: 12/14/20  5:28 AM  Result Value Ref Range   WBC 8.0 4.0 - 10.5 K/uL   RBC 2.47 (L) 3.87 - 5.11 MIL/uL   Hemoglobin 6.8 (L) 12.0 - 15.0 g/dL   HCT 20.3 (L) 36.0 - 46.0 %   MCV 82.2 80.0 - 100.0 fL   MCH 27.5 26.0 - 34.0 pg   MCHC 33.5 30.0 - 36.0 g/dL   RDW 16.7 (H) 11.5 - 15.5 %   Platelets 62 (L) 150 - 400 K/uL   nRBC 0.0 0.0 - 0.2 %   Neutrophils Relative % 73 %   Neutro Abs 5.9 1.7 - 7.7 K/uL   Lymphocytes Relative 19 %   Lymphs Abs 1.5 0.7 - 4.0 K/uL   Monocytes Relative 5 %   Monocytes Absolute 0.4 0.1 - 1.0 K/uL   Eosinophils Relative 2 %   Eosinophils Absolute 0.2 0.0 - 0.5 K/uL   Basophils Relative 0 %   Basophils Absolute 0.0 0.0 - 0.1 K/uL   Immature Granulocytes 1 %   Abs Immature Granulocytes 0.09 (H) 0.00 - 0.07 K/uL  Comprehensive metabolic panel     Status: Abnormal   Collection Time: 12/14/20  5:28 AM  Result Value Ref Range   Sodium 136 135 - 145 mmol/L   Potassium 3.3 (L) 3.5 - 5.1 mmol/L   Chloride 101 98 - 111 mmol/L   CO2 29 22 - 32 mmol/L   Glucose, Bld 122 (H) 70 - 99 mg/dL   BUN 37 (H) 6 - 20 mg/dL   Creatinine, Ser 3.64 (H) 0.44 - 1.00 mg/dL   Calcium 7.4 (L) 8.9 - 10.3 mg/dL   Total Protein 5.3 (L) 6.5 - 8.1 g/dL   Albumin 2.1 (L) 3.5 - 5.0 g/dL   AST 35 15 - 41 U/L   ALT 12 0 - 44 U/L   Alkaline Phosphatase 74 38 - 126 U/L   Total Bilirubin 0.6 0.3 - 1.2 mg/dL   GFR, Estimated 14 (L) >60 mL/min   Anion gap 6 5 - 15  Magnesium     Status: None   Collection Time: 12/14/20  5:28 AM  Result Value  Ref Range   Magnesium 1.9 1.7 - 2.4 mg/dL  Glucose, capillary     Status: Abnormal   Collection Time: 12/14/20  7:56 AM  Result Value Ref Range   Glucose-Capillary 120 (H) 70 - 99 mg/dL  Prepare RBC (crossmatch)     Status: None  Collection Time: 12/14/20  8:00 AM  Result Value Ref Range   Order Confirmation      ORDER PROCESSED BY BLOOD BANK Performed at [facility], [address] [place], [place] [address]   Glucose, capillary     Status: Abnormal   Collection Time: 12/14/20 11:42 AM  Result Value Ref Range   Glucose-Capillary 260 (H) 70 - 99 mg/dL    Recent Results (from the past 240 hour(s))  CULTURE, BLOOD (ROUTINE X 2) w Reflex to ID Panel     Status: None   Collection Time: 12/06/20 10:35 AM   Specimen: BLOOD  Result Value Ref Range Status   Specimen Description BLOOD LT KNUCKLE  Final   Special Requests   Final    BOTTLES DRAWN AEROBIC AND ANAEROBIC Blood Culture adequate volume   Culture   Final    NO GROWTH 5 DAYS Performed at [facility], [address] [place], [place] [address]    Report Status 12/11/2020 FINAL  Final  CULTURE, BLOOD (ROUTINE X 2) w Reflex to ID Panel     Status: None   Collection Time: 12/06/20 10:35 AM   Specimen: BLOOD  Result Value Ref Range Status   Specimen Description BLOOD BLOOD LEFT WRIST  Final   Special Requests AEROBIC BOTTLE ONLY Blood Culture adequate volume  Final   Culture   Final    NO GROWTH 5 DAYS Performed at [facility], [address] [place], [place] [address]    Report Status 12/11/2020 FINAL  Final     Radiology Studies: No results found. US Venous Img Upper Bilat (DVT)  Final Result    US Venous Img Lower Bilateral (DVT)  Final Result    DG Chest 1 View  Final Result    CT BONE MARROW BIOPSY  Final Result    US RENAL  Final Result    MR Lumbar Spine W Wo Contrast  Final Result    MR THORACIC SPINE W WO CONTRAST  Final Result    MR CERVICAL SPINE W WO  CONTRAST  Final Result    MR BRAIN W WO CONTRAST  Final Result    DG Chest Port 1 View  Final Result    [place] EKG SITE RITE  Final Result    CT HEAD W & WO CONTRAST  Final Result    CT CHEST ABDOMEN PELVIS W CONTRAST  Final Result    DG FL GUIDED LUMBAR PUNCTURE  Final Result    CT Head Wo Contrast  Final Result    DG Chest Portable 1 View  Final Result      Scheduled Meds:  amLODipine  5 mg Oral Daily   Chlorhexidine Gluconate Cloth  6 each Topical Daily   diltiazem  120 mg Oral Daily   feeding supplement  1 Container Oral TID BM   insulin aspart  0-15 Units Subcutaneous TID WC   insulin aspart  0-5 Units Subcutaneous QHS   levothyroxine  100 mcg Oral QAC breakfast   magnesium oxide  800 mg Oral BID   mirtazapine  15 mg Oral QHS   multivitamin with minerals  1 tablet Oral Daily   pantoprazole  40 mg Oral Daily   pneumococcal 13-valent conjugate vaccine  0.5 mL Intramuscular Tomorrow-1000   psyllium  1 packet Oral Daily   rosuvastatin  10 mg Oral QHS   sodium bicarbonate  650 mg Oral TID   sodium chloride flush  10-40 mL Intracatheter Q12H  thiamine  100 mg Oral Daily   PRN Meds: acetaminophen **OR** acetaminophen, albuterol, haloperidol lactate, loperamide, ondansetron **OR** ondansetron (ZOFRAN) IV, oxyCODONE, polyethylene glycol, sodium chloride flush Continuous Infusions:   LOS: 24 days  Time spent: Greater than 50% of the 35 minute visit was spent in counseling/coordination of care for the patient as laid out in the A&P.   Dwyane Dee, MD [facility] 12/14/2020, 2:16 PM

## 2020-12-14 NOTE — Progress Notes (Signed)
[facility]  ROUNDING NOTE   Subjective:   Continues to have oozing from her catheter exit site.  Hemoglobin 6.8 - PRBC transfusion ordered  Platelet transfusion yesterday - platelets improved to 81kg, now 62K  Creatinine 3.64 (2.82)  Tmax 102.3   Objective:  Vital signs in last 24 hours:  Temp:  [98 F (36.7 C)-102.3 F (39.1 C)] 101.6 F (38.7 C) (08/21 1403) Pulse Rate:  [63-86] 86 (08/21 1403) Resp:  [16-18] 18 (08/21 1403) BP: (118-149)/(64-75) 125/72 (08/21 1403) SpO2:  [92 %-100 %] 98 % (08/21 1403)  Weight change:  Filed Weights   12/01/20 0920 12/01/20 1730 12/10/20 1345  Weight: 94.9 kg 94.4 kg 94.4 kg    Intake/Output: I/O last 3 completed shifts: In: 647 [Blood:647] Out: 800 [Urine:800]   Intake/Output this shift:  Total I/O In: 374 [Blood:374] Out: -   Physical Exam: General: NAD, laying in bed  Head: Normocephalic, atraumatic. Moist oral mucosal membranes  Eyes: Anicteric  Lungs:  Clear to auscultation, normal effort  Heart: Regular rate and rhythm  Abdomen:  Soft, nontender  Extremities: trace peripheral edema.  Neurologic: Alert, moving all four extremities  Skin: No lesions  Access RUE femoral temp cath 8/17 Schnier (bloody drainage)    Basic Metabolic Panel: Recent Labs  Lab 12/08/20 0719 12/09/20 0512 12/10/20 0508 12/11/20 0437 12/12/20 0510 12/13/20 0515 12/14/20 0528  NA 136   < > 137 138 135 132* 136  K 4.6   < > 4.7 4.1 3.8 3.9 3.3*  CL 110   < > 109 104 102 98 101  CO2 21*   < > 21* 26 29 27 29   GLUCOSE 394*   < > 132* 74 167* 436* 122*  BUN 56*   < > 75* 49* 32* 31* 37*  CREATININE 3.72*   < > 4.42* 3.36* 2.80* 2.82* 3.64*  CALCIUM 7.9*   < > 8.3* 7.4* 7.3* 7.3* 7.4*  MG 2.0  --   --  1.8 1.8 1.9 1.9   < > = values in this interval not displayed.     Liver Function Tests: Recent Labs  Lab 12/09/20 1020 12/11/20 0437 12/12/20 0510 12/13/20 0515 12/14/20 0528  AST 37 47* 73* 52* 35  ALT 9 17  26 17 12   ALKPHOS 72 70 78 81 74  BILITOT 0.5 0.8 0.9 1.0 0.6  PROT 5.5* 5.1* 4.9* 5.3* 5.3*  ALBUMIN 2.2* 2.1* 2.1* 2.1* 2.1*    No results for input(s): LIPASE, AMYLASE in the last 168 hours.  No results for input(s): AMMONIA in the last 168 hours.  CBC: Recent Labs  Lab 12/10/20 0508 12/11/20 0437 12/11/20 1835 12/12/20 0510 12/13/20 0515 12/14/20 0008 12/14/20 0528  WBC 8.0 6.4 6.3 6.1 6.2  --  8.0  NEUTROABS 5.7 4.8  --  4.5 4.8  --  5.9  HGB 7.4* 6.7* 7.7* 7.3* 7.6*  --  6.8*  HCT 22.3* 20.1* 23.0* 21.9* 22.4*  --  20.3*  MCV 80.8 82.0 81.3 80.5 81.2  --  82.2  PLT 21* 32* 36* 30* 30* 81* 62*     Cardiac Enzymes: No results for input(s): CKTOTAL, CKMB, CKMBINDEX, TROPONINI in the last 168 hours.   BNP: Invalid input(s): POCBNP  CBG: Recent Labs  Lab 12/13/20 1144 12/13/20 1639 12/13/20 2026 12/14/20 0756 12/14/20 1142  GLUCAP 441* 298* 220* 120* 260*     Microbiology: Results for orders placed or performed during the hospital encounter of 11/19/20  Blood culture (  routine x 2)     Status: None   Collection Time: 11/19/20  4:47 PM   Specimen: BLOOD  Result Value Ref Range Status   Specimen Description BLOOD RIGHT ANTECUBITAL  Final   Special Requests   Final    BOTTLES DRAWN AEROBIC AND ANAEROBIC Blood Culture adequate volume   Culture   Final    NO GROWTH 5 DAYS Performed at [facility], [address] [name], [place] [address]    Report Status 11/24/2020 FINAL  Final  Resp Panel by RT-PCR (Flu A&B, Covid) Nasopharyngeal Swab     Status: None   Collection Time: 11/19/20  4:48 PM   Specimen: Nasopharyngeal Swab; Nasopharyngeal(NP) swabs in vial transport medium  Result Value Ref Range Status   SARS Coronavirus 2 by RT PCR NEGATIVE NEGATIVE Final    Comment: (NOTE) SARS-CoV-2 target nucleic acids are NOT DETECTED.  The SARS-CoV-2 RNA is generally detectable in upper respiratory specimens during the acute phase of infection. The  lowest concentration of SARS-CoV-2 viral copies this assay can detect is 138 copies/mL. A negative result does not preclude SARS-Cov-2 infection and should not be used as the sole basis for treatment or other patient management decisions. A negative result may occur with  improper specimen collection/handling, submission of specimen other than nasopharyngeal swab, presence of viral mutation(s) within the areas targeted by this assay, and inadequate number of viral copies(<138 copies/mL). A negative result must be combined with clinical observations, patient history, and epidemiological information. The expected result is Negative.  Fact Sheet for Patients:  EntrepreneurPulse.com.au  Fact Sheet for Healthcare Providers:  IncredibleEmployment.be  This test is no t yet approved or cleared by the [place] FDA and  has been authorized for detection and/or diagnosis of SARS-CoV-2 by FDA under an Emergency Use Authorization (EUA). This EUA will remain  in effect (meaning this test can be used) for the duration of the COVID-19 declaration under Section 564(b)(1) of the Act, 21 U.S.C.section 360bbb-3(b)(1), unless the authorization is terminated  or revoked sooner.       Influenza A by PCR NEGATIVE NEGATIVE Final   Influenza B by PCR NEGATIVE NEGATIVE Final    Comment: (NOTE) The Xpert Xpress SARS-CoV-2/FLU/RSV plus assay is intended as an aid in the diagnosis of influenza from Nasopharyngeal swab specimens and should not be used as a sole basis for treatment. Nasal washings and aspirates are unacceptable for Xpert Xpress SARS-CoV-2/FLU/RSV testing.  Fact Sheet for Patients: EntrepreneurPulse.com.au  Fact Sheet for Healthcare Providers: IncredibleEmployment.be  This test is not yet approved or cleared by the [place] FDA and has been authorized for detection and/or diagnosis of SARS-CoV-2 by FDA under  an Emergency Use Authorization (EUA). This EUA will remain in effect (meaning this test can be used) for the duration of the COVID-19 declaration under Section 564(b)(1) of the Act, 21 U.S.C. section 360bbb-3(b)(1), unless the authorization is terminated or revoked.  Performed at [facility], [address] [place], [place] [address]   Blood culture (routine x 2)     Status: None   Collection Time: 11/19/20  6:46 PM   Specimen: BLOOD  Result Value Ref Range Status   Specimen Description BLOOD RIGHT ANTECUBITAL  Final   Special Requests   Final    BOTTLES DRAWN AEROBIC AND ANAEROBIC Blood Culture adequate volume   Culture   Final    NO GROWTH 5 DAYS Performed at [facility], [address] [place], [place] [address]  Report Status 11/24/2020 FINAL  Final  Urine Culture     Status: Abnormal   Collection Time: 11/19/20  9:33 PM   Specimen: Urine, Clean Catch  Result Value Ref Range Status   Specimen Description   Final    URINE, CLEAN CATCH Performed at [facility], [address], [place], [place] [address]    Special Requests   Final    NONE Performed at [facility], [address] [place], [place] [address]    Culture (A)  Final    <10,000 COLONIES/mL INSIGNIFICANT GROWTH Performed at [facility] [address] [place], [place] [address]    Report Status 11/21/2020 FINAL  Final  Culture, fungus without smear     Status: None   Collection Time: 11/20/20 11:09 AM   Specimen: CSF; Cerebrospinal Fluid  Result Value Ref Range Status   Specimen Description   Final    CSF Performed at [facility], [address], [place], [place] [address]    Special Requests   Final    NONE Performed at [facility], [address], [place], [place] [address]    Culture   Final    NO FUNGUS ISOLATED AFTER 21 DAYS Performed at [facility], [place] [address], [place], [place] [address]    Report Status  12/11/2020 FINAL  Final  CSF culture w Gram Stain     Status: None   Collection Time: 11/20/20 11:09 AM   Specimen: PATH Cytology CSF; Cerebrospinal Fluid  Result Value Ref Range Status   Specimen Description   Final    CSF Performed at [facility], [address] [place], [place] [address]    Special Requests   Final    NONE Performed at [facility], [address] [place], [place] [address]    Gram Stain   Final    NO ORGANISMS SEEN WBC SEEN RED BLOOD CELLS PRESENT Performed at [facility], [address] [place], [place] [address]    Culture   Final    NO GROWTH 3 DAYS Performed at [facility], [place] [address] [place], [place] [address]    Report Status 11/23/2020 FINAL  Final  Fungus Culture With Stain     Status: None (Preliminary result)   Collection Time: 11/20/20 11:09 AM   Specimen: PATH Cytology CSF; Cerebrospinal Fluid  Result Value Ref Range Status   Fungus Stain Final report  Final    Comment: (NOTE) Performed At: [facility] [place], [place] [id number] Rush Farmer MD EG:[id number]    Fungus (Mycology) Culture PENDING  Incomplete   Fungal Source CSF  Final    Comment: Performed at [facility], [address] [place],  [address]  Fungus Culture Result     Status: None   Collection Time: 11/20/20 11:09 AM  Result Value Ref Range Status   Result 1 Comment  Final    Comment: (NOTE) KOH/Calcofluor preparation:  no fungus observed. Performed At: [facility] [place], [place] [id number] Rush Farmer MD VO:[id number]   Resp Panel by RT-PCR (Flu A&B, Covid) Nasopharyngeal Swab     Status: None   Collection Time: 11/27/20  1:39 PM   Specimen: Nasopharyngeal Swab; Nasopharyngeal(NP) swabs in vial transport medium  Result Value Ref Range Status   SARS Coronavirus 2 by RT PCR NEGATIVE NEGATIVE Final    Comment: (NOTE) SARS-CoV-2 target nucleic acids are  NOT DETECTED.  The SARS-CoV-2 RNA is generally detectable in upper respiratory specimens during the acute phase of infection. The lowest concentration of SARS-CoV-2 viral copies this assay can detect is 138 copies/mL. A negative result does not preclude SARS-Cov-2 infection and should not be used as the sole basis for treatment or other patient management decisions. A negative result may occur with  improper specimen collection/handling, submission of specimen other than nasopharyngeal swab, presence of viral mutation(s) within the areas targeted by this assay, and inadequate number of viral copies(<138 copies/mL). A negative result must be combined with clinical observations, patient history, and epidemiological information. The expected result is Negative.  Fact Sheet for Patients:  EntrepreneurPulse.com.au  Fact Sheet for Healthcare Providers:  IncredibleEmployment.be  This test is no t yet approved or cleared by the [place] FDA and  has been authorized for detection and/or diagnosis of SARS-CoV-2 by FDA under an Emergency Use Authorization (EUA). This EUA will remain  in effect (meaning this test can be used) for the duration of the COVID-19 declaration under Section 564(b)(1) of the Act, 21 U.S.C.section 360bbb-3(b)(1), unless the authorization is terminated  or revoked sooner.       Influenza A by PCR NEGATIVE NEGATIVE Final   Influenza B by PCR NEGATIVE NEGATIVE Final    Comment: (NOTE) The Xpert Xpress SARS-CoV-2/FLU/RSV plus assay is intended as an aid in the diagnosis of influenza from Nasopharyngeal swab specimens and should not be used as a sole basis for treatment. Nasal washings and aspirates are unacceptable for Xpert Xpress SARS-CoV-2/FLU/RSV testing.  Fact Sheet for Patients: EntrepreneurPulse.com.au  Fact Sheet for Healthcare Providers: IncredibleEmployment.be  This test is not  yet approved or cleared by the [place] FDA and has been authorized for detection and/or diagnosis of SARS-CoV-2 by FDA under an Emergency Use Authorization (EUA). This EUA will remain in effect (meaning this test can be used) for the duration of the COVID-19 declaration under Section 564(b)(1) of the Act, 21 U.S.C. section 360bbb-3(b)(1), unless the authorization is terminated or revoked.  Performed at [facility], [address] [place], [place] [address]   Urine Culture     Status: Abnormal   Collection Time: 11/27/20  2:15 PM   Specimen: Urine, Clean Catch  Result Value Ref Range Status   Specimen Description   Final    URINE, CLEAN CATCH Performed at [facility], [address] [name], [place] [address]    Special Requests   Final    NONE Performed at [facility], [place], [place] [address]    Culture 20,000 COLONIES/mL ENTEROBACTER AEROGENES (A)  Final   Report Status 11/30/2020 FINAL  Final   Organism ID, Bacteria ENTEROBACTER AEROGENES (A)  Final      Susceptibility   Enterobacter aerogenes - MIC*    CEFAZOLIN >=64 RESISTANT Resistant     CEFEPIME 1 SENSITIVE Sensitive     CEFTRIAXONE >=64 RESISTANT Resistant     CIPROFLOXACIN <=0.25 SENSITIVE Sensitive     GENTAMICIN <=1 SENSITIVE Sensitive     IMIPENEM <=0.25 SENSITIVE Sensitive     NITROFURANTOIN 64 INTERMEDIATE Intermediate     TRIMETH/SULFA <=20 SENSITIVE Sensitive     PIP/TAZO >=128 RESISTANT Resistant     * 20,000 COLONIES/mL ENTEROBACTER AEROGENES  CULTURE, BLOOD (ROUTINE X 2) w Reflex to ID Panel     Status: None   Collection Time: 11/27/20  8:23 PM   Specimen: BLOOD  Result Value Ref Range Status   Specimen Description BLOOD LAC  Final   Special Requests   Final    BOTTLES DRAWN AEROBIC AND ANAEROBIC Blood Culture results may not be optimal due to an inadequate volume of blood received in culture bottles   Culture   Final    NO GROWTH 5  DAYS Performed at [facility], [address] [place], [place] [address]    Report Status 12/02/2020 FINAL  Final  CULTURE, BLOOD (ROUTINE X 2) w Reflex to ID Panel     Status: None   Collection Time: 11/27/20  9:32 PM   Specimen: BLOOD LEFT HAND  Result Value Ref Range Status   Specimen Description BLOOD LEFT HAND  Final   Special Requests   Final    BOTTLES DRAWN AEROBIC AND ANAEROBIC Blood Culture adequate volume   Culture   Final    NO GROWTH 5 DAYS Performed at [facility], [address] [place], [place] [address]    Report Status 12/02/2020 FINAL  Final  CULTURE, BLOOD (ROUTINE X 2) w Reflex to ID Panel     Status: None   Collection Time: 12/06/20 10:35 AM   Specimen: BLOOD  Result Value Ref Range Status   Specimen Description BLOOD LT KNUCKLE  Final   Special Requests   Final    BOTTLES DRAWN AEROBIC AND ANAEROBIC Blood Culture adequate volume   Culture   Final    NO GROWTH 5 DAYS Performed at [facility], [address] [place], [place] [address]    Report Status 12/11/2020 FINAL  Final  CULTURE, BLOOD (ROUTINE X 2) w Reflex to ID Panel     Status: None   Collection Time: 12/06/20 10:35 AM   Specimen: BLOOD  Result Value Ref Range Status   Specimen Description BLOOD BLOOD LEFT WRIST  Final   Special Requests AEROBIC BOTTLE ONLY Blood Culture adequate volume  Final   Culture   Final    NO GROWTH 5 DAYS Performed at [facility], [address] [place], [place] [address]    Report Status 12/11/2020 FINAL  Final    Coagulation Studies: No results for input(s): LABPROT, INR in the last 72 hours.   Urinalysis: No results for input(s): COLORURINE, LABSPEC, PHURINE, GLUCOSEU, HGBUR, BILIRUBINUR, KETONESUR, PROTEINUR, UROBILINOGEN, NITRITE, LEUKOCYTESUR in the last 72 hours.  Invalid input(s): APPERANCEUR     Imaging: No results found.   Medications:       amLODipine  5 mg Oral Daily   Chlorhexidine  Gluconate Cloth  6 each Topical Daily   diltiazem  120 mg Oral Daily   feeding supplement  1 Container Oral TID BM   insulin aspart  0-15 Units Subcutaneous TID WC   insulin aspart  0-5 Units Subcutaneous QHS   levothyroxine  100 mcg Oral QAC breakfast   magnesium oxide  800 mg Oral BID   mirtazapine  15 mg Oral QHS   multivitamin with minerals  1 tablet Oral Daily   pantoprazole  40 mg Oral Daily   pneumococcal 13-valent conjugate vaccine  0.5 mL Intramuscular Tomorrow-1000   psyllium  1 packet Oral Daily   rosuvastatin  10 mg Oral QHS   sodium bicarbonate  650 mg Oral TID   sodium chloride flush  10-40 mL Intracatheter Q12H   thiamine  100 mg Oral Daily   acetaminophen **OR** acetaminophen, albuterol, haloperidol lactate, loperamide, ondansetron **OR** ondansetron (ZOFRAN) IV, oxyCODONE, polyethylene glycol, sodium chloride flush  Assessment/ Plan:  [name] is a 55 y.o. black female  with diabetes mellitus type II, hypertension, hypothyroidism, CLL who was admitted to [facility] on 11/19/2020 for Altered mental status [R41.82] Encephalopathy acute [G93.40] Altered mental status, unspecified altered mental status type [R41.82] Sepsis with encephalopathy without septic shock, due to unspecified organism ([place]) [A41.9, R65.20, G93.40]  Acute Kidney Injury with hematuria and proteinuria with baseline creatinine 0.49 on 11/22/20.  IV contrast exposure on November 21, 2020.  Renal ultrasound negative for mass and obstruction.  Serologic studies: ANA+, ENA+, antichromatin +, anti- Ro_, P-ANCA + Completed 3 days of high dose IV steroids Rituximab on 8/17. Second dose in 4 weeks.  - Patient will need kidney biopsy. Will work with Interventional radiology.  - Monitor daily for dialysis need. Plan on next treatment for tomorrow.  - Consider transfer to a tertiary care center. Patient is now on the wait list at [facility] for transfer.   Lab Results  Component Value Date   CREATININE 3.64 (H)  12/14/2020   CREATININE 2.82 (H) 12/13/2020   CREATININE 2.80 (H) 12/12/2020    Intake/Output Summary (Last 24 hours) at 12/14/2020 1424 Last data filed at 12/14/2020 1421 Gross per 24 hour  Intake 1021 ml  Output 200 ml  Net 821 ml    2. Anemia  with history of CLL and thrombocytopenia Lab Results  Component Value Date   HGB 6.8 (L) 12/14/2020   Bone marrow biopsy 12/04/20, limited marrow material with trilineage hematopoiesis. Recommending repeat bone marrow biopsy Appreciate hematology input.  Now with bleeding from catheter site.  - status post platelet and red cell transfusions.   3. Fever of unknown origin: not currently on antibiotics. Receiving blood products and rituximab. Most recent cultures are negative.  - Check urine culture.     LOS: 24 Pavneet Markwood 8/21/20222:24 PM

## 2020-12-15 ENCOUNTER — Encounter: Payer: Self-pay | Admitting: Vascular Surgery

## 2020-12-15 DIAGNOSIS — D8989 Other specified disorders involving the immune mechanism, not elsewhere classified: Secondary | ICD-10-CM | POA: Diagnosis not present

## 2020-12-15 LAB — COMPREHENSIVE METABOLIC PANEL
ALT: 13 U/L (ref 0–44)
AST: 43 U/L — ABNORMAL HIGH (ref 15–41)
Albumin: 2 g/dL — ABNORMAL LOW (ref 3.5–5.0)
Alkaline Phosphatase: 73 U/L (ref 38–126)
Anion gap: 4 — ABNORMAL LOW (ref 5–15)
BUN: 40 mg/dL — ABNORMAL HIGH (ref 6–20)
CO2: 30 mmol/L (ref 22–32)
Calcium: 7.4 mg/dL — ABNORMAL LOW (ref 8.9–10.3)
Chloride: 102 mmol/L (ref 98–111)
Creatinine, Ser: 4.43 mg/dL — ABNORMAL HIGH (ref 0.44–1.00)
GFR, Estimated: 11 mL/min — ABNORMAL LOW (ref >60)
Glucose, Bld: 136 mg/dL — ABNORMAL HIGH (ref 70–99)
Potassium: 4.2 mmol/L (ref 3.5–5.1)
Sodium: 136 mmol/L (ref 135–145)
Total Bilirubin: 0.6 mg/dL (ref 0.3–1.2)
Total Protein: 4.7 g/dL — ABNORMAL LOW (ref 6.5–8.1)

## 2020-12-15 LAB — URINALYSIS, ROUTINE W REFLEX MICROSCOPIC
Bilirubin Urine: NEGATIVE
Glucose, UA: 50 mg/dL — AB
Ketones, ur: NEGATIVE mg/dL
Nitrite: NEGATIVE
Protein, ur: 100 mg/dL — AB
RBC / HPF: >50 RBC/hpf — ABNORMAL HIGH (ref 0–5)
Specific Gravity, Urine: 1.013 (ref 1.005–1.030)
pH: 9 — ABNORMAL HIGH (ref 5.0–8.0)

## 2020-12-15 LAB — CBC WITH DIFFERENTIAL/PLATELET
Abs Immature Granulocytes: 0.07 10*3/uL (ref 0.00–0.07)
Basophils Absolute: 0 10*3/uL (ref 0.0–0.1)
Basophils Relative: 0 %
Eosinophils Absolute: 0.1 10*3/uL (ref 0.0–0.5)
Eosinophils Relative: 1 %
HCT: 22.1 % — ABNORMAL LOW (ref 36.0–46.0)
Hemoglobin: 7.6 g/dL — ABNORMAL LOW (ref 12.0–15.0)
Immature Granulocytes: 1 %
Lymphocytes Relative: 20 %
Lymphs Abs: 1.2 10*3/uL (ref 0.7–4.0)
MCH: 28 pg (ref 26.0–34.0)
MCHC: 34.4 g/dL (ref 30.0–36.0)
MCV: 81.5 fL (ref 80.0–100.0)
Monocytes Absolute: 0.2 10*3/uL (ref 0.1–1.0)
Monocytes Relative: 4 %
Neutro Abs: 4.7 10*3/uL (ref 1.7–7.7)
Neutrophils Relative %: 74 %
Platelets: 34 10*3/uL — ABNORMAL LOW (ref 150–400)
RBC: 2.71 MIL/uL — ABNORMAL LOW (ref 3.87–5.11)
RDW: 15.9 % — ABNORMAL HIGH (ref 11.5–15.5)
WBC: 6.3 10*3/uL (ref 4.0–10.5)
nRBC: 0 % (ref 0.0–0.2)

## 2020-12-15 LAB — MAGNESIUM: Magnesium: 1.9 mg/dL (ref 1.7–2.4)

## 2020-12-15 LAB — PLATELET COUNT: Platelets: 65 10*3/uL — ABNORMAL LOW (ref 150–400)

## 2020-12-15 LAB — GLUCOSE, CAPILLARY
Glucose-Capillary: 141 mg/dL — ABNORMAL HIGH (ref 70–99)
Glucose-Capillary: 285 mg/dL — ABNORMAL HIGH (ref 70–99)
Glucose-Capillary: 288 mg/dL — ABNORMAL HIGH (ref 70–99)
Glucose-Capillary: 235 mg/dL — ABNORMAL HIGH (ref 70–99)

## 2020-12-15 MED ORDER — DIPHENHYDRAMINE HCL 50 MG/ML IJ SOLN
25.0000 mg | Freq: Once | INTRAMUSCULAR | Status: AC | PRN
  Administered 2020-12-15: 18:00:00 25 mg via INTRAVENOUS
  Filled 2020-12-15: qty 1

## 2020-12-15 MED ORDER — SODIUM CHLORIDE 0.9% IV SOLUTION: Freq: Once | INTRAVENOUS | Status: AC

## 2020-12-15 MED ORDER — ACETAMINOPHEN 325 MG PO TABS
650.0000 mg | ORAL_TABLET | Freq: Once | ORAL | Status: AC | PRN
  Administered 2020-12-15: 650 mg via ORAL
  Filled 2020-12-15: qty 2

## 2020-12-15 NOTE — Progress Notes (Signed)
Occupational Therapy Treatment Patient Details Name: Tonya Myers MRN: [id number] DOB: 1965-05-21 Today's Date: 12/15/2020    History of present illness Pt admitted to [facility] on 11/19/20 for decreased appetite, vomiting, AMS, and lethargy. On EMS assessment, pt was found to have fever 102 and AMS with confusion and left sided weakness.  Found to have pancytopenia, oncology was consulted due to hx of CLL, but so far no concern of CLL relapse. ID also on board, initially concerning for meningitis due to hx of pneumococcal meningitis. PMH significant for: DDD, cervical radiculopathy, anxiety, DM, HTN, hypothyroidism, CLL, asthma, cLBP, vertigo, and hx of stroke.   OT comments  Pt seen for OT treatment on this date. Upon arrival to room, pt awake in bed with cousin at bedside. Pt agreeable to OT tx and requesting tx session to focus on addressing UE strength/coordination via bed-level therex and leisure activity. With yellow theraputty and red theraband, pt engaged in exercises targeting in-hand manipulation and improving strength of grip, elbow flexion, and shoulder flexion. Pt also engaged in card game, which required pt to deal and flip cards with LUE, and identify cards of higher/lower value. At end of session, pt requesting cousin to obtain cards in order to continue engaging UE/cognition via card leisure activity outside of OT tx. Bed mobility not assessed at this time due to temporary femoral HD access in right groin. Pt continues to benefit from skilled OT services to maximize return to PLOF and minimize risk of future falls, injury, caregiver burden, and readmission. Discharge recommendation remains appropriate, however frequency decreased d/t femoral HD access in right groin and pt on bedrest.   Follow Up Recommendations  [name]    Equipment Recommendations  Other (comment) (defer to next venue of care)       Precautions / Restrictions Precautions Precautions: Fall Precaution Comments:  currently has a temporary femoral trialysis catheter in right groin, avoid hip flexion Restrictions Weight Bearing Restrictions: No       Mobility Bed Mobility               General bed mobility comments: Bed mobility not assessed at this time due to femoral access in right groin.                       ADL either performed or assessed with clinical judgement   ADL Overall ADL's : Needs assistance/impaired                                       General ADL Comments: ADL performance not assess this date d/t pt requesting to address UE strength/coordination via bed-level therex and leisure activity      Cognition Arousal/Alertness: Awake/alert Behavior During Therapy: WFL for tasks assessed/performed Overall Cognitive Status: Within Functional Limits for tasks assessed                                 General Comments: Motivated and verbalizing preferences for goal of tx session. Requires increased time to perform tasks        Exercises General Exercises - Upper Extremity Shoulder Flexion: Strengthening;Both;10 reps;Supine;Theraband Theraband Level (Shoulder Flexion): Level 2 (Red) Elbow Flexion: Strengthening;Both;10 reps;Supine;Theraband Theraband Level (Elbow Flexion): Level 2 (Red) Hand Exercises Digit Composite Flexion: Strengthening;Both;10 reps (with yellow theraputty) Hand Activities Cards - Deal: Right;20 reps;Supine Cards - Flip:  Right;20 reps;Supine Other Exercises Other Exercises: Pt engaged in card game which required pt to deal and flip cards, and identify cards of higher/lower value           Pertinent Vitals/ Pain       Pain Assessment: No/denies pain         Frequency  Min 2X/week        Progress Toward Goals  OT Goals(current goals can now be found in the care plan section)  Progress towards OT goals: Progressing toward goals  Acute Rehab OT Goals Patient Stated Goal: to have less stiffness  and get stronger OT Goal Formulation: With patient/family Time For Goal Achievement: 12/23/20 Potential to Achieve Goals: Good  Plan Discharge plan remains appropriate;Frequency needs to be updated       AM-PAC OT "6 Clicks" Daily Activity     Outcome Measure   Help from another person eating meals?: A Little Help from another person taking care of personal grooming?: A Little Help from another person toileting, which includes using toliet, bedpan, or urinal?: A Lot Help from another person bathing (including washing, rinsing, drying)?: A Lot Help from another person to put on and taking off regular upper body clothing?: A Little Help from another person to put on and taking off regular lower body clothing?: A Lot 6 Click Score: 15    End of Session    OT Visit Diagnosis: Muscle weakness (generalized) (M62.81);Other symptoms and signs involving cognitive function   Activity Tolerance Patient tolerated treatment well   Patient Left in bed;with call bell/phone within reach;with bed alarm set;with family/visitor present   Nurse Communication Mobility status        Time: 1610-9604 OT Time Calculation (min): 24 min  Charges: OT General Charges $OT Visit: 1 Visit OT Treatments $Therapeutic Activity: 23-37 mins  Fredirick Maudlin, OTR/L [place]

## 2020-12-15 NOTE — Progress Notes (Signed)
Progress Note    Tonya Myers   AJG:[id number]  DOB: 1966/03/07  DOA: 11/19/2020     25  PCP: [name], MD  Initial CC: AMS  Hospital Course: Tonya Myers is a 55 y.o. female with medical history significant for recent pneumococcal meningitis and bacteremia, depression, hypothyroid, IDDM2 who presented to the ED with AMS.  She underwent extensive work-up after admission with multiple subspecialty consults. Infectious work-up with ID is negative and ID has signed off on 12/09/2020 (see FUO workup below).  She also underwent further work-up with nephrology, oncology, and rheumatology. She has multiple serologies concerning for possible lupus and trying to now obtain renal biopsy to help further diagnose.  Thrombocytopenia has been preventing biopsy obtainment. Due to worsening renal function, nephrology also considering possible initiation of dialysis.  Interval History:  No further events.  Resting in bed comfortably.  Discussed plan for trying to still get renal biopsy.  Now we will try for tomorrow hopefully.  ROS: Constitutional: positive for fatigue and malaise, negative for chills and fevers, Respiratory: negative for cough and sputum, Cardiovascular: negative for chest pain, and Gastrointestinal: negative for abdominal pain  Assessment & Plan:  * 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[place]) - Serologic work-up indicative of underlying autoimmune process; concern is Lupus vs vasculitis vs RPGN  - s/p 3 days steroids (8/12 - 8/14, solumedrol 1g daily) - oncology, nephrology, and rheumatology following - tentative plan is renal biopsy as PLTC allows (see thrombocytopenia); nephrology recommending transfer for definitive diagnosis/biopsy and/or treatment with plasmaphresis if needed/indicated - also starting Rituxan on 8/19 while still coordinating renal bx in background (have been limited by thrombocytopenia)  Thrombocytopenia ([place]) - also see pancytopenia below - PLTC down to 21k on  8/17; no overt bleeding but in efforts to facilitate renal bx will transfuse 2 packs platelets  - PLTC only up to 32k on 8/18; some oozing as expected around trialysis cath; repeat 1 unit Platelet on 8/18 - still low on 8/20; [id number] ordered per nephro on 8/20 - s/p 2 units Platelets on 8/20 - s/p more platelets on 8/22 - follow up PLTC in am and trying to target PLTC>50  AKI (acute kidney injury) ([place]) - baseline creat ~0.7 and renal function has continued to deteriorate - nephrology following as well, greatly appreciate assistance - s/p femoral trialysis on 8/17; started 1st HD session on 8/17 as well - continue HD per nephrology  FUO (fever of unknown origin) - ID has followed and signed off on 8/16 (see their note as well) - fevers had defervesced on 8/12 but then recurred on 8/19 prior to Rituxan and then defervesced some; again having fevers on 8/21 - continue trending fever curve; unclear etiology given negative infectious workup recently this may be related to underlying autoimmune process as well  Controlled type 2 diabetes mellitus without complication, without long-term current use of insulin ([place]) - A1c 6.5% on 10/02/20 - s/p steroids and sounds like CBGs elevated at that time  - remains on SSI as well as prandial coverage and basal insulin - she is at risk for hypoglycemia with that regimen (as has already happened) - now that steroids are complete, will decrease her insulin regimen  - d/c prandial and Semglee - use SSI only for now - solumedrol 100 mg given on 8/19 with Rituxan: CBGs elevated since; will treat acutely but CBGs should improve after steroid washes out as noted above   Personal history of CLL (chronic lymphocytic leukemia) - in remission since 2016 per oncology  -  no relapse seen on peripheral smear performed on 11/25/20 "Unremarkable WBC count with unremarkable WBC morphology. Thrombocytopenia with unremarkable platelet morphology. Findings do not support CLL  relapse." - no findings on BM biopsy on 8/11 but repeat biopsy was recommended as well: "Trilineage hematopoiesis (limited material)"  Oral thrush Patient with clinical indications of thrush Associated with difficulty swallowing Patient states poor p.o. intake is driven by appetite rather than pain --s/p Diflucan for 3 doses  Pancytopenia ([name]) - history of CLL as well All 3 cell lines acutely depressed Oncology consulted Patient's CLL has been in remission since 2016 Recurrence of hematologic note malignancy felt unlikely --s/p 1u PRBC 8/5  - s/p 1u PRBC 8/10 - s/p 1u PRBC 8/18 - s/p 1u PRBC 8/21  Hypokalemia - Replete and recheck as needed  Hypomagnesemia - Replete and recheck as needed  Acute metabolic encephalopathy Unclear etiology --meningitis ruled out by LP.  CT head neg for new stroke.  Hematology ruled out CLL as the culprit.  No seizure on EEG. --mental status dramatically improved with treatment of pancreatitis and IV thiamine repletion. --MRI brain, no acute finding -- Completed high-dose IV thiamine x3 days  Hyperlipidemia, mixed - Continue statin  Essential hypertension - Continue Cardizem and hydralazine  Hypothyroidism - Continue Synthroid  Pancreatitis-resolved as of 12/11/2020 Unclear etiology No reported history of alcohol intake, no radiographic evidence of gallstones Possible drug-induced Initial abdominal examination negative for tenderness Lipase trending down with IVF, but much slowly than usual. Triglycerides negative - now resolved    Old records reviewed in assessment of this patient  Antimicrobials:   DVT prophylaxis: Place TED hose Start: 11/19/20 2301   Code Status:   Code Status: Full Code Family Communication: Tonya Myers  Disposition Plan: Status is: Inpatient  Remains inpatient appropriate because:Inpatient level of care appropriate due to severity of illness  Dispo: The patient is from: Home              Anticipated  d/c is to: Home              Patient currently is not medically stable to d/c.   Difficult to place patient No  Risk of unplanned readmission score: Unplanned Admission- Pilot do not use: 49.03   Objective: Blood pressure 140/74, pulse 91, temperature (!) 101.9 F (38.8 C), temperature source Oral, resp. rate 17, height 6' (1.829 m), weight 94.4 kg, SpO2 96 %.  Examination: General appearance: alert, cooperative, and no distress Head:  face appears slightly oversized Mouth: no thrush noted; Torus Palatinus noted  Eyes:  EOMI Lungs: clear to auscultation bilaterally Heart: regular rate and rhythm and S1, S2 normal Abdomen: normal findings: bowel sounds normal and soft, non-tender Extremities:  no LE edema; swollen joints noted in ankle, wrist; some edema in fingers Skin:  excess skin noted due to weight loss Neurologic: no focal deficits   Consultants:  Oncology/hematology Rheumatology Nephrology ID  Procedures:    Data Reviewed: I have personally reviewed following labs and imaging studies Results for orders placed or performed during the hospital encounter of 11/19/20 (from the past 24 hour(s))  Culture, blood (single) w Reflex to ID Panel     Status: None (Preliminary result)   Collection Time: 12/14/20  2:36 PM   Specimen: BLOOD  Result Value Ref Range   Specimen Description BLOOD BLOOD RIGHT HAND    Special Requests      BOTTLES DRAWN AEROBIC AND ANAEROBIC Blood Culture adequate volume   Culture  NO GROWTH < 24 HOURS Performed at [facility], [address] [place], [place] [address]    Report Status PENDING   CBC with Differential/Platelet     Status: Abnormal   Collection Time: 12/14/20  3:37 PM  Result Value Ref Range   WBC 6.4 4.0 - 10.5 K/uL   RBC 2.80 (L) 3.87 - 5.11 MIL/uL   Hemoglobin 7.8 (L) 12.0 - 15.0 g/dL   HCT 23.1 (L) 36.0 - 46.0 %   MCV 82.5 80.0 - 100.0 fL   MCH 27.9 26.0 - 34.0 pg   MCHC 33.8 30.0 - 36.0 g/dL   RDW 15.4 11.5 -  15.5 %   Platelets 44 (L) 150 - 400 K/uL   nRBC 0.0 0.0 - 0.2 %   Neutrophils Relative % 75 %   Neutro Abs 4.8 1.7 - 7.7 K/uL   Lymphocytes Relative 17 %   Lymphs Abs 1.1 0.7 - 4.0 K/uL   Monocytes Relative 5 %   Monocytes Absolute 0.3 0.1 - 1.0 K/uL   Eosinophils Relative 1 %   Eosinophils Absolute 0.1 0.0 - 0.5 K/uL   Basophils Relative 0 %   Basophils Absolute 0.0 0.0 - 0.1 K/uL   Immature Granulocytes 2 %   Abs Immature Granulocytes 0.11 (H) 0.00 - 0.07 K/uL  Culture, blood (single) w Reflex to ID Panel     Status: None (Preliminary result)   Collection Time: 12/14/20  3:38 PM   Specimen: BLOOD  Result Value Ref Range   Specimen Description BLOOD A-LINE DRAW    Special Requests      BOTTLES DRAWN AEROBIC AND ANAEROBIC Blood Culture adequate volume   Culture      NO GROWTH < 24 HOURS Performed at [facility], [address] [place], [place] [address]    Report Status PENDING   Glucose, capillary     Status: Abnormal   Collection Time: 12/14/20  3:52 PM  Result Value Ref Range   Glucose-Capillary 220 (H) 70 - 99 mg/dL  Glucose, capillary     Status: Abnormal   Collection Time: 12/14/20  8:16 PM  Result Value Ref Range   Glucose-Capillary 168 (H) 70 - 99 mg/dL  CBC with Differential/Platelet     Status: Abnormal   Collection Time: 12/15/20  5:23 AM  Result Value Ref Range   WBC 6.3 4.0 - 10.5 K/uL   RBC 2.71 (L) 3.87 - 5.11 MIL/uL   Hemoglobin 7.6 (L) 12.0 - 15.0 g/dL   HCT 22.1 (L) 36.0 - 46.0 %   MCV 81.5 80.0 - 100.0 fL   MCH 28.0 26.0 - 34.0 pg   MCHC 34.4 30.0 - 36.0 g/dL   RDW 15.9 (H) 11.5 - 15.5 %   Platelets 34 (L) 150 - 400 K/uL   nRBC 0.0 0.0 - 0.2 %   Neutrophils Relative % 74 %   Neutro Abs 4.7 1.7 - 7.7 K/uL   Lymphocytes Relative 20 %   Lymphs Abs 1.2 0.7 - 4.0 K/uL   Monocytes Relative 4 %   Monocytes Absolute 0.2 0.1 - 1.0 K/uL   Eosinophils Relative 1 %   Eosinophils Absolute 0.1 0.0 - 0.5 K/uL   Basophils Relative 0 %    Basophils Absolute 0.0 0.0 - 0.1 K/uL   Immature Granulocytes 1 %   Abs Immature Granulocytes 0.07 0.00 - 0.07 K/uL  Comprehensive metabolic panel     Status: Abnormal   Collection Time: 12/15/20  5:23 AM  Result  Value Ref Range   Sodium 136 135 - 145 mmol/L   Potassium 4.2 3.5 - 5.1 mmol/L   Chloride 102 98 - 111 mmol/L   CO2 30 22 - 32 mmol/L   Glucose, Bld 136 (H) 70 - 99 mg/dL   BUN 40 (H) 6 - 20 mg/dL   Creatinine, Ser 4.43 (H) 0.44 - 1.00 mg/dL   Calcium 7.4 (L) 8.9 - 10.3 mg/dL   Total Protein 4.7 (L) 6.5 - 8.1 g/dL   Albumin 2.0 (L) 3.5 - 5.0 g/dL   AST 43 (H) 15 - 41 U/L   ALT 13 0 - 44 U/L   Alkaline Phosphatase 73 38 - 126 U/L   Total Bilirubin 0.6 0.3 - 1.2 mg/dL   GFR, Estimated 11 (L) >60 mL/min   Anion gap 4 (L) 5 - 15  Magnesium     Status: None   Collection Time: 12/15/20  5:23 AM  Result Value Ref Range   Magnesium 1.9 1.7 - 2.4 mg/dL  Prepare Pheresed Platelets     Status: None (Preliminary result)   Collection Time: 12/15/20  7:24 AM  Result Value Ref Range   Unit Number [id number]    Blood Component Type PSORALEN TREATED    Unit division 00    Status of Unit ISSUED    Unit tag comment IRRADIATED PRODUCT    Transfusion Status      OK TO TRANSFUSE Performed at [facility], [address] [place], [place] [address]   Glucose, capillary     Status: Abnormal   Collection Time: 12/15/20  7:34 AM  Result Value Ref Range   Glucose-Capillary 141 (H) 70 - 99 mg/dL  Glucose, capillary     Status: Abnormal   Collection Time: 12/15/20 11:50 AM  Result Value Ref Range   Glucose-Capillary 285 (H) 70 - 99 mg/dL    Recent Results (from the past 240 hour(s))  CULTURE, BLOOD (ROUTINE X 2) w Reflex to ID Panel     Status: None   Collection Time: 12/06/20 10:35 AM   Specimen: BLOOD  Result Value Ref Range Status   Specimen Description BLOOD LT KNUCKLE  Final   Special Requests   Final    BOTTLES DRAWN AEROBIC AND ANAEROBIC Blood Culture adequate  volume   Culture   Final    NO GROWTH 5 DAYS Performed at [facility], [address] [place], [place] [address]    Report Status 12/11/2020 FINAL  Final  CULTURE, BLOOD (ROUTINE X 2) w Reflex to ID Panel     Status: None   Collection Time: 12/06/20 10:35 AM   Specimen: BLOOD  Result Value Ref Range Status   Specimen Description BLOOD BLOOD LEFT WRIST  Final   Special Requests AEROBIC BOTTLE ONLY Blood Culture adequate volume  Final   Culture   Final    NO GROWTH 5 DAYS Performed at [facility], [address] [place], [place] [address]    Report Status 12/11/2020 FINAL  Final  Culture, blood (single) w Reflex to ID Panel     Status: None (Preliminary result)   Collection Time: 12/14/20  2:36 PM   Specimen: BLOOD  Result Value Ref Range Status   Specimen Description BLOOD BLOOD RIGHT HAND  Final   Special Requests   Final    BOTTLES DRAWN AEROBIC AND ANAEROBIC Blood Culture adequate volume   Culture   Final    NO GROWTH < 24 HOURS Performed at [facility],  [place], [place] [address]    Report Status PENDING  Incomplete  Culture, blood (single) w Reflex to ID Panel     Status: None (Preliminary result)   Collection Time: 12/14/20  3:38 PM   Specimen: BLOOD  Result Value Ref Range Status   Specimen Description BLOOD A-LINE DRAW  Final   Special Requests   Final    BOTTLES DRAWN AEROBIC AND ANAEROBIC Blood Culture adequate volume   Culture   Final    NO GROWTH < 24 HOURS Performed at [facility], [address] [place], [place] [address]    Report Status PENDING  Incomplete     Radiology Studies: No results found. US Venous Img Upper Bilat (DVT)  Final Result    US Venous Img Lower Bilateral (DVT)  Final Result    DG Chest 1 View  Final Result    CT BONE MARROW BIOPSY  Final Result    US RENAL  Final Result    MR Lumbar Spine W Wo Contrast  Final Result    MR THORACIC SPINE W WO CONTRAST  Final  Result    MR CERVICAL SPINE W WO CONTRAST  Final Result    MR BRAIN W WO CONTRAST  Final Result    DG Chest Port 1 View  Final Result    [place] EKG SITE RITE  Final Result    CT HEAD W & WO CONTRAST  Final Result    CT CHEST ABDOMEN PELVIS W CONTRAST  Final Result    DG FL GUIDED LUMBAR PUNCTURE  Final Result    CT Head Wo Contrast  Final Result    DG Chest Portable 1 View  Final Result    US BIOPSY (KIDNEY)    (Results Pending)    Scheduled Meds:  sodium chloride   Intravenous Once   amLODipine  5 mg Oral Daily   Chlorhexidine Gluconate Cloth  6 each Topical Daily   diltiazem  120 mg Oral Daily   feeding supplement  1 Container Oral TID BM   insulin aspart  0-15 Units Subcutaneous TID WC   insulin aspart  0-5 Units Subcutaneous QHS   levothyroxine  100 mcg Oral QAC breakfast   magnesium oxide  800 mg Oral BID   mirtazapine  15 mg Oral QHS   multivitamin with minerals  1 tablet Oral Daily   pantoprazole  40 mg Oral Daily   pneumococcal 13-valent conjugate vaccine  0.5 mL Intramuscular Tomorrow-1000   psyllium  1 packet Oral Daily   rosuvastatin  10 mg Oral QHS   sodium bicarbonate  650 mg Oral TID   sodium chloride flush  10-40 mL Intracatheter Q12H   thiamine  100 mg Oral Daily   PRN Meds: acetaminophen **OR** acetaminophen, albuterol, haloperidol lactate, loperamide, ondansetron **OR** ondansetron (ZOFRAN) IV, oxyCODONE, polyethylene glycol, sodium chloride flush Continuous Infusions:   LOS: 25 days  Time spent: Greater than 50% of the 35 minute visit was spent in counseling/coordination of care for the patient as laid out in the A&P.   Dwyane Dee, MD [facility] 12/15/2020, 12:39 PM

## 2020-12-15 NOTE — Progress Notes (Signed)
   12/15/20 1230  Assess: MEWS Score  Temp (!) 101.9 F (38.8 C)  BP 140/74  Pulse Rate 91  Resp 17  Assess: MEWS Score  MEWS Temp 2  MEWS Systolic 0  MEWS Pulse 0  MEWS RR 0  MEWS LOC 0  MEWS Score 2  MEWS Score Color Yellow  Assess: if the MEWS score is Yellow or Red  Were vital signs taken at a resting state? Yes  Focused Assessment No change from prior assessment  Does the patient meet 2 or more of the SIRS criteria? No  Does the patient have a confirmed or suspected source of infection? No  Provider and Rapid Response Notified? No  MEWS guidelines implemented *See Row Information* No, previously yellow, continue vital signs every 4 hours  Assess: SIRS CRITERIA  SIRS Temperature  1  SIRS Pulse 1  SIRS Respirations  0  SIRS WBC 0  SIRS Score Sum  2

## 2020-12-15 NOTE — Progress Notes (Signed)
Patient received one unit of platelets earlier this shift. Plan was to receive additional unit of platelets and recheck platelet count prior to kidney biopsy with Interventional Radiology. Blood bank called to state that a "pooled" platelet unit was ready for patient. This RN spoke with Dr. Sabino Gasser to plan for platelet transfusion, Dr. Sabino Gasser with request for RN to reach out to Dr. Grayland Ormond to ensure it is okay for patient to receive pooled platelet unit.   This RN placed call to Dr. Grayland Ormond and left voicemail message. Secure chat sent to Dr. Grayland Ormond and Dr. Sabino Gasser to coordinate.  Awaiting call back/ response prior to beginning unit.

## 2020-12-15 NOTE — Progress Notes (Signed)
[facility]  ROUNDING NOTE   Subjective:   Continues to have oozing from her catheter exit site.   Patient says she feels tired today.  Platelets declined to 34, planned transfusion today  Creatinine 4.43 (3.64) (2.82)  Tmax 101.62F   Objective:  Vital signs in last 24 hours:  Temp:  [99 F (37.2 C)-102.3 F (39.1 C)] 100.1 F (37.8 C) (08/22 0731) Pulse Rate:  [64-86] 67 (08/22 0731) Resp:  [17-20] 18 (08/22 0731) BP: (125-149)/(64-86) 137/76 (08/22 0731) SpO2:  [94 %-100 %] 98 % (08/22 0731)  Weight change:  Filed Weights   12/01/20 0920 12/01/20 1730 12/10/20 1345  Weight: 94.9 kg 94.4 kg 94.4 kg    Intake/Output: I/O last 3 completed shifts: In: 0960 [Blood:1021] Out: 400 [Urine:400]   Intake/Output this shift:  Total I/O In: 180 [P.O.:180] Out: -   Physical Exam: General: NAD, laying in bed  Head: Normocephalic, atraumatic. Moist oral mucosal membranes  Eyes: Anicteric  Lungs:  Clear to auscultation, normal effort  Heart: Regular rate and rhythm  Abdomen:  Soft, nontender  Extremities: 1+ peripheral edema.  Neurologic: Alert, moving all four extremities  Skin: No lesions  Access RUE femoral temp cath 8/17 Schnier (bloody drainage)    Basic Metabolic Panel: Recent Labs  Lab 12/11/20 0437 12/12/20 0510 12/13/20 0515 12/14/20 0528 12/15/20 0523  NA 138 135 132* 136 136  K 4.1 3.8 3.9 3.3* 4.2  CL 104 102 98 101 102  CO2 26 29 27 29 30   GLUCOSE 74 167* 436* 122* 136*  BUN 49* 32* 31* 37* 40*  CREATININE 3.36* 2.80* 2.82* 3.64* 4.43*  CALCIUM 7.4* 7.3* 7.3* 7.4* 7.4*  MG 1.8 1.8 1.9 1.9 1.9     Liver Function Tests: Recent Labs  Lab 12/11/20 0437 12/12/20 0510 12/13/20 0515 12/14/20 0528 12/15/20 0523  AST 47* 73* 52* 35 43*  ALT 17 26 17 12 13   ALKPHOS 70 78 81 74 73  BILITOT 0.8 0.9 1.0 0.6 0.6  PROT 5.1* 4.9* 5.3* 5.3* 4.7*  ALBUMIN 2.1* 2.1* 2.1* 2.1* 2.0*    No results for input(s): LIPASE, AMYLASE in the  last 168 hours.  No results for input(s): AMMONIA in the last 168 hours.  CBC: Recent Labs  Lab 12/12/20 0510 12/13/20 0515 12/14/20 0008 12/14/20 0528 12/14/20 1537 12/15/20 0523  WBC 6.1 6.2  --  8.0 6.4 6.3  NEUTROABS 4.5 4.8  --  5.9 4.8 4.7  HGB 7.3* 7.6*  --  6.8* 7.8* 7.6*  HCT 21.9* 22.4*  --  20.3* 23.1* 22.1*  MCV 80.5 81.2  --  82.2 82.5 81.5  PLT 30* 30* 81* 62* 44* 34*     Cardiac Enzymes: No results for input(s): CKTOTAL, CKMB, CKMBINDEX, TROPONINI in the last 168 hours.   BNP: Invalid input(s): POCBNP  CBG: Recent Labs  Lab 12/14/20 0756 12/14/20 1142 12/14/20 1552 12/14/20 2016 12/15/20 0734  GLUCAP 120* 260* 220* 168* 141*     Microbiology: Results for orders placed or performed during the hospital encounter of 11/19/20  Blood culture (routine x 2)     Status: None   Collection Time: 11/19/20  4:47 PM   Specimen: BLOOD  Result Value Ref Range Status   Specimen Description BLOOD RIGHT ANTECUBITAL  Final   Special Requests   Final    BOTTLES DRAWN AEROBIC AND ANAEROBIC Blood Culture adequate volume   Culture   Final    NO GROWTH 5 DAYS Performed at [facility], [place], [place] [address]    Report Status 11/24/2020 FINAL  Final  Resp Panel by RT-PCR (Flu A&B, Covid) Nasopharyngeal Swab     Status: None   Collection Time: 11/19/20  4:48 PM   Specimen: Nasopharyngeal Swab; Nasopharyngeal(NP) swabs in vial transport medium  Result Value Ref Range Status   SARS Coronavirus 2 by RT PCR NEGATIVE NEGATIVE Final    Comment: (NOTE) SARS-CoV-2 target nucleic acids are NOT DETECTED.  The SARS-CoV-2 RNA is generally detectable in upper respiratory specimens during the acute phase of infection. The lowest concentration of SARS-CoV-2 viral copies this assay can detect is 138 copies/mL. A negative result does not preclude SARS-Cov-2 infection and should not be used as the sole basis for treatment or other patient  management decisions. A negative result may occur with  improper specimen collection/handling, submission of specimen other than nasopharyngeal swab, presence of viral mutation(s) within the areas targeted by this assay, and inadequate number of viral copies(<138 copies/mL). A negative result must be combined with clinical observations, patient history, and epidemiological information. The expected result is Negative.  Fact Sheet for Patients:  EntrepreneurPulse.com.au  Fact Sheet for Healthcare Providers:  IncredibleEmployment.be  This test is no t yet approved or cleared by the [place] FDA and  has been authorized for detection and/or diagnosis of SARS-CoV-2 by FDA under an Emergency Use Authorization (EUA). This EUA will remain  in effect (meaning this test can be used) for the duration of the COVID-19 declaration under Section 564(b)(1) of the Act, 21 U.S.C.section 360bbb-3(b)(1), unless the authorization is terminated  or revoked sooner.       Influenza A by PCR NEGATIVE NEGATIVE Final   Influenza B by PCR NEGATIVE NEGATIVE Final    Comment: (NOTE) The Xpert Xpress SARS-CoV-2/FLU/RSV plus assay is intended as an aid in the diagnosis of influenza from Nasopharyngeal swab specimens and should not be used as a sole basis for treatment. Nasal washings and aspirates are unacceptable for Xpert Xpress SARS-CoV-2/FLU/RSV testing.  Fact Sheet for Patients: EntrepreneurPulse.com.au  Fact Sheet for Healthcare Providers: IncredibleEmployment.be  This test is not yet approved or cleared by the [place] FDA and has been authorized for detection and/or diagnosis of SARS-CoV-2 by FDA under an Emergency Use Authorization (EUA). This EUA will remain in effect (meaning this test can be used) for the duration of the COVID-19 declaration under Section 564(b)(1) of the Act, 21 U.S.C. section 360bbb-3(b)(1),  unless the authorization is terminated or revoked.  Performed at [facility], [address] [place], [place] [address]   Blood culture (routine x 2)     Status: None   Collection Time: 11/19/20  6:46 PM   Specimen: BLOOD  Result Value Ref Range Status   Specimen Description BLOOD RIGHT ANTECUBITAL  Final   Special Requests   Final    BOTTLES DRAWN AEROBIC AND ANAEROBIC Blood Culture adequate volume   Culture   Final    NO GROWTH 5 DAYS Performed at [facility], [address] [place], [place] [address]    Report Status 11/24/2020 FINAL  Final  Urine Culture     Status: Abnormal   Collection Time: 11/19/20  9:33 PM   Specimen: Urine, Clean Catch  Result Value Ref Range Status   Specimen Description   Final    URINE, CLEAN CATCH Performed at [facility], [address] [place], [place] [address]    Special Requests   Final  NONE Performed at [facility], [address], [place], [place] [address]    Culture (A)  Final    <10,000 COLONIES/mL INSIGNIFICANT GROWTH Performed at [facility] [address], [place], [place] [address]    Report Status 11/21/2020 FINAL  Final  Culture, fungus without smear     Status: None   Collection Time: 11/20/20 11:09 AM   Specimen: CSF; Cerebrospinal Fluid  Result Value Ref Range Status   Specimen Description   Final    CSF Performed at [facility], [address], [place], [place] [address]    Special Requests   Final    NONE Performed at [facility], [address] [place], [place] [address]    Culture   Final    NO FUNGUS ISOLATED AFTER 21 DAYS Performed at [facility], [place] [address] [place], [place] [address]    Report Status 12/11/2020 FINAL  Final  CSF culture w Gram Stain     Status: None   Collection Time: 11/20/20 11:09 AM   Specimen: PATH Cytology CSF; Cerebrospinal Fluid  Result Value Ref Range Status   Specimen Description   Final     CSF Performed at [facility], [address], [place], [place] [address]    Special Requests   Final    NONE Performed at [facility], [address] [place], [place] [address]    Gram Stain   Final    NO ORGANISMS SEEN WBC SEEN RED BLOOD CELLS PRESENT Performed at [facility], [address], [place], [place] [address]    Culture   Final    NO GROWTH 3 DAYS Performed at [facility], [place] [address], [place], [place] [address]    Report Status 11/23/2020 FINAL  Final  Fungus Culture With Stain     Status: None (Preliminary result)   Collection Time: 11/20/20 11:09 AM   Specimen: PATH Cytology CSF; Cerebrospinal Fluid  Result Value Ref Range Status   Fungus Stain Final report  Final    Comment: (NOTE) Performed At: [facility] [place], [place] [id number] Rush Farmer MD [id number]    Fungus (Mycology) Culture PENDING  Incomplete   Fungal Source CSF  Final    Comment: Performed at [facility], [address] [place], [place] [address]  Fungus Culture Result     Status: None   Collection Time: 11/20/20 11:09 AM  Result Value Ref Range Status   Result 1 Comment  Final    Comment: (NOTE) KOH/Calcofluor preparation:  no fungus observed. Performed At: [facility] [place], [place] [id number] Rush Farmer MD [id number]   Resp Panel by RT-PCR (Flu A&B, Covid) Nasopharyngeal Swab     Status: None   Collection Time: 11/27/20  1:39 PM   Specimen: Nasopharyngeal Swab; Nasopharyngeal(NP) swabs in vial transport medium  Result Value Ref Range Status   SARS Coronavirus 2 by RT PCR NEGATIVE NEGATIVE Final    Comment: (NOTE) SARS-CoV-2 target nucleic acids are NOT DETECTED.  The SARS-CoV-2 RNA is generally detectable in upper respiratory specimens during the acute phase of infection. The lowest concentration of SARS-CoV-2 viral copies this assay can detect is 138  copies/mL. A negative result does not preclude SARS-Cov-2 infection and should not be used as the sole basis for treatment or other patient management decisions. A negative result may occur with  improper specimen collection/handling, submission of specimen other than nasopharyngeal  swab, presence of viral mutation(s) within the areas targeted by this assay, and inadequate number of viral copies(<138 copies/mL). A negative result must be combined with clinical observations, patient history, and epidemiological information. The expected result is Negative.  Fact Sheet for Patients:  EntrepreneurPulse.com.au  Fact Sheet for Healthcare Providers:  IncredibleEmployment.be  This test is no t yet approved or cleared by the [place] FDA and  has been authorized for detection and/or diagnosis of SARS-CoV-2 by FDA under an Emergency Use Authorization (EUA). This EUA will remain  in effect (meaning this test can be used) for the duration of the COVID-19 declaration under Section 564(b)(1) of the Act, 21 U.S.C.section 360bbb-3(b)(1), unless the authorization is terminated  or revoked sooner.       Influenza A by PCR NEGATIVE NEGATIVE Final   Influenza B by PCR NEGATIVE NEGATIVE Final    Comment: (NOTE) The Xpert Xpress SARS-CoV-2/FLU/RSV plus assay is intended as an aid in the diagnosis of influenza from Nasopharyngeal swab specimens and should not be used as a sole basis for treatment. Nasal washings and aspirates are unacceptable for Xpert Xpress SARS-CoV-2/FLU/RSV testing.  Fact Sheet for Patients: EntrepreneurPulse.com.au  Fact Sheet for Healthcare Providers: IncredibleEmployment.be  This test is not yet approved or cleared by the [place] FDA and has been authorized for detection and/or diagnosis of SARS-CoV-2 by FDA under an Emergency Use Authorization (EUA). This EUA will remain in effect (meaning  this test can be used) for the duration of the COVID-19 declaration under Section 564(b)(1) of the Act, 21 U.S.C. section 360bbb-3(b)(1), unless the authorization is terminated or revoked.  Performed at [facility], [address] [place], [place] [address]   Urine Culture     Status: Abnormal   Collection Time: 11/27/20  2:15 PM   Specimen: Urine, Clean Catch  Result Value Ref Range Status   Specimen Description   Final    URINE, CLEAN CATCH Performed at [facility], [address], [place], [place] [address]    Special Requests   Final    NONE Performed at [facility], [place], [name] [address]    Culture 20,000 COLONIES/mL ENTEROBACTER AEROGENES (A)  Final   Report Status 11/30/2020 FINAL  Final   Organism ID, Bacteria ENTEROBACTER AEROGENES (A)  Final      Susceptibility   Enterobacter aerogenes - MIC*    CEFAZOLIN >=64 RESISTANT Resistant     CEFEPIME 1 SENSITIVE Sensitive     CEFTRIAXONE >=64 RESISTANT Resistant     CIPROFLOXACIN <=0.25 SENSITIVE Sensitive     GENTAMICIN <=1 SENSITIVE Sensitive     IMIPENEM <=0.25 SENSITIVE Sensitive     NITROFURANTOIN 64 INTERMEDIATE Intermediate     TRIMETH/SULFA <=20 SENSITIVE Sensitive     PIP/TAZO >=128 RESISTANT Resistant     * 20,000 COLONIES/mL ENTEROBACTER AEROGENES  CULTURE, BLOOD (ROUTINE X 2) w Reflex to ID Panel     Status: None   Collection Time: 11/27/20  8:23 PM   Specimen: BLOOD  Result Value Ref Range Status   Specimen Description BLOOD LAC  Final   Special Requests   Final    BOTTLES DRAWN AEROBIC AND ANAEROBIC Blood Culture results may not be optimal due to an inadequate volume of blood received in culture bottles   Culture   Final    NO GROWTH 5 DAYS Performed at [facility], [address], [place], [place] [address]    Report Status 12/02/2020 FINAL  Final  CULTURE, BLOOD (ROUTINE X 2) w Reflex to ID Panel     Status: None   Collection Time:  11/27/20  9:32 PM   Specimen: BLOOD LEFT HAND  Result Value Ref Range Status   Specimen Description BLOOD LEFT HAND  Final   Special Requests   Final    BOTTLES DRAWN AEROBIC AND ANAEROBIC Blood Culture adequate volume   Culture   Final    NO GROWTH 5 DAYS Performed at [facility], [address] [place], [place] [address]    Report Status 12/02/2020 FINAL  Final  CULTURE, BLOOD (ROUTINE X 2) w Reflex to ID Panel     Status: None   Collection Time: 12/06/20 10:35 AM   Specimen: BLOOD  Result Value Ref Range Status   Specimen Description BLOOD LT KNUCKLE  Final   Special Requests   Final    BOTTLES DRAWN AEROBIC AND ANAEROBIC Blood Culture adequate volume   Culture   Final    NO GROWTH 5 DAYS Performed at [facility], [address] [place], [place] [address]    Report Status 12/11/2020 FINAL  Final  CULTURE, BLOOD (ROUTINE X 2) w Reflex to ID Panel     Status: None   Collection Time: 12/06/20 10:35 AM   Specimen: BLOOD  Result Value Ref Range Status   Specimen Description BLOOD BLOOD LEFT WRIST  Final   Special Requests AEROBIC BOTTLE ONLY Blood Culture adequate volume  Final   Culture   Final    NO GROWTH 5 DAYS Performed at [facility], [address] [place], [place] [address]    Report Status 12/11/2020 FINAL  Final  Culture, blood (single) w Reflex to ID Panel     Status: None (Preliminary result)   Collection Time: 12/14/20  2:36 PM   Specimen: BLOOD  Result Value Ref Range Status   Specimen Description BLOOD BLOOD RIGHT HAND  Final   Special Requests   Final    BOTTLES DRAWN AEROBIC AND ANAEROBIC Blood Culture adequate volume   Culture   Final    NO GROWTH < 24 HOURS Performed at [facility], [address], [place], [place] [address]    Report Status PENDING  Incomplete  Culture, blood (single) w Reflex to ID Panel     Status: None (Preliminary result)   Collection Time: 12/14/20  3:38 PM   Specimen: BLOOD  Result  Value Ref Range Status   Specimen Description BLOOD A-LINE DRAW  Final   Special Requests   Final    BOTTLES DRAWN AEROBIC AND ANAEROBIC Blood Culture adequate volume   Culture   Final    NO GROWTH < 24 HOURS Performed at [facility], [address] [place], [place] [address]    Report Status PENDING  Incomplete    Coagulation Studies: No results for input(s): LABPROT, INR in the last 72 hours.   Urinalysis: No results for input(s): COLORURINE, LABSPEC, PHURINE, GLUCOSEU, HGBUR, BILIRUBINUR, KETONESUR, PROTEINUR, UROBILINOGEN, NITRITE, LEUKOCYTESUR in the last 72 hours.  Invalid input(s): APPERANCEUR     Imaging: No results found.   Medications:       sodium chloride   Intravenous Once   sodium chloride   Intravenous Once   amLODipine  5 mg Oral Daily   Chlorhexidine Gluconate Cloth  6 each Topical Daily   diltiazem  120 mg Oral Daily   feeding supplement  1 Container Oral TID BM   insulin aspart  0-15 Units Subcutaneous  TID WC   insulin aspart  0-5 Units Subcutaneous QHS   levothyroxine  100 mcg Oral QAC breakfast   magnesium oxide  800 mg Oral BID   mirtazapine  15 mg Oral QHS   multivitamin with minerals  1 tablet Oral Daily   pantoprazole  40 mg Oral Daily   pneumococcal 13-valent conjugate vaccine  0.5 mL Intramuscular Tomorrow-1000   psyllium  1 packet Oral Daily   rosuvastatin  10 mg Oral QHS   sodium bicarbonate  650 mg Oral TID   sodium chloride flush  10-40 mL Intracatheter Q12H   thiamine  100 mg Oral Daily   acetaminophen **OR** acetaminophen, albuterol, haloperidol lactate, loperamide, ondansetron **OR** ondansetron (ZOFRAN) IV, oxyCODONE, polyethylene glycol, sodium chloride flush  Assessment/ Plan:  [name] is a 55 y.o. black female with diabetes mellitus type II, hypertension, hypothyroidism, CLL who was admitted to [facility] on 11/19/2020 for Altered mental status [R41.82] Encephalopathy acute [G93.40] Altered mental status,  unspecified altered mental status type [R41.82] Sepsis with encephalopathy without septic shock, due to unspecified organism ([place]) [A41.9, R65.20, G93.40]  Acute Kidney Injury with hematuria and proteinuria with baseline creatinine 0.49 on 11/22/20.  IV contrast exposure on November 21, 2020.  Renal ultrasound negative for mass and obstruction.  Serologic studies: ANA+, ENA+, antichromatin +, anti- Ro_, P-ANCA + Completed 3 days of high dose IV steroids Rituximab on 8/17. Second dose in 4 weeks.  - Patient needs kidney biopsy. IR willing if platelets transfused before procedure. Plan to transfuse today, procedure scheduled for tomorrow.  - Monitor daily for dialysis need. Plan on next treatment for tomorrow.  - Consider transfer to a tertiary care center.    Lab Results  Component Value Date   CREATININE 4.43 (H) 12/15/2020   CREATININE 3.64 (H) 12/14/2020   CREATININE 2.82 (H) 12/13/2020    Intake/Output Summary (Last 24 hours) at 12/15/2020 1032 Last data filed at 12/15/2020 1020 Gross per 24 hour  Intake 554 ml  Output 200 ml  Net 354 ml    2. Anemia  with history of CLL and thrombocytopenia Lab Results  Component Value Date   HGB 7.6 (L) 12/15/2020   Bone marrow biopsy 12/04/20, limited marrow material with trilineage hematopoiesis. Recommending repeat bone marrow biopsy Appreciate hematology input.  Now with bleeding from catheter site.  - status post platelet and red cell transfusions.  - Contacted vascular to assess and place stitch if needed.   3. Fever of unknown origin: not currently on antibiotics. Receiving blood products and rituximab. Most recent cultures are negative.  - Check urine culture.     LOS: [place] 8/22/202210:32 AM

## 2020-12-15 NOTE — Progress Notes (Signed)
PT Cancellation Note  Patient Details Name: Tonya Myers MRN: [id number] DOB: Jul 27, 1965   Cancelled Treatment:    Reason Eval/Treat Not Completed: Other (comment)  Pt stated she is scheduled for biopsy tomorrow and MD told her not to do anything until it is done.  Will return at a later time/date.  Chesley Noon 12/15/2020, 1:45 PM

## 2020-12-15 NOTE — Consult Note (Signed)
Chief Complaint: Patient was seen in consultation today for acute kidney injury at the request of Dwyane Dee, MD  Referring Physician(s): Dwyane Dee, MD  Supervising Physician: Michaelle Birks  Patient Status: Tonya Myers - In-pt  History of Present Illness: Tonya Myers is a 55 y.o. female with past medical history significant for CLL and is known to our service with previous bone marrow biopsy performed during this admission.  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  The patient is currently admitted with diagnosis of altered mental status changes which has improved, fever of unknown origin, acute kidney injury which is persistent with work-up completed by infectious disease, nephrology, oncology and rheumatology now initiating dialysis and request received for image guided nontargeted renal biopsy.  The patient has thrombocytopenia and has been receiving multiple platelet transfusions.  Today the patient denies any flank pain, chest pain, shortness of breath or palpitations.  The patient states she has previously tolerated sedation without difficulty.  Past Medical History:  Diagnosis Date   Acute hemorrhoid 03/11/2015   Anxiety    Arthritis    Asthma    Cancer ([place])    Chronic back pain    CLL (chronic lymphocytic leukemia) ([place])    Depression    Diabetes mellitus without complication ([place])    Genital herpes    type 2   Hypertension    Hypothyroidism    Vertigo     Past Surgical History:  Procedure Laterality Date   ABDOMINAL HYSTERECTOMY     BILATERAL SALPINGOOPHORECTOMY  2009   BREAST BIOPSY Right 05/17/2016   FIBROADENOMATOUS CHANGE AND SCLEROSING ADENOSIS WITH   COLONOSCOPY WITH PROPOFOL N/A 06/16/2015   Procedure: COLONOSCOPY WITH PROPOFOL;  Surgeon: [name], MD;  Location: [facility] ENDOSCOPY;  Service: Endoscopy;  Laterality: N/A;   LAPAROSCOPIC SUPRACERVICAL HYSTERECTOMY  2009   due to [place] N/A 12/10/2020   Procedure: TEMPORARY DIALYSIS CATHETER;  Surgeon: Katha Cabal, MD;  Location: [place] CV LAB;  Service: Cardiovascular;  Laterality: N/A;   THORACIC LAMINECTOMY FOR EPIDURAL ABSCESS Bilateral 06/17/2020   Procedure: THORACIC LAMINECTOMY FOR EPIDURAL ABSCESS;  Surgeon: Deetta Perla, MD;  Location: [facility] ORS;  Service: Neurosurgery;  Laterality: Bilateral;    Allergies: Amoxapine and Penicillins  Medications: Prior to Admission medications   Medication Sig Start Date End Date Taking? Authorizing Provider  acetaminophen (TYLENOL) 325 MG tablet Take 2 tablets (650 mg total) by mouth every 6 (six) hours as needed for mild pain (or Fever >/= 101). 07/30/20  Yes Angiulli, Lavon Paganini, PA-C  albuterol (VENTOLIN HFA) 108 (90 Base) MCG/ACT inhaler Inhale into the lungs. 05/06/20 05/06/21 Yes [provider]  aspirin EC 81 MG tablet Take 1 tablet (81 mg total) by mouth daily. Swallow whole. 10/02/20  Yes Max Sane, MD  diclofenac Sodium (VOLTAREN) 1 % GEL Apply 2 g topically 4 (four) times daily. 07/30/20  Yes Angiulli, Lavon Paganini, PA-C  diltiazem (DILACOR XR) 120 MG 24 hr capsule Take 1 capsule (120 mg total) by mouth daily. 07/30/20  Yes Angiulli, Lavon Paganini, PA-C  ezetimibe (ZETIA) 10 MG tablet Take 1 tablet (10 mg total) by mouth daily. 07/30/20  Yes Angiulli, Lavon Paganini, PA-C  fluticasone (FLONASE) 50 MCG/ACT nasal spray 1 spray 2 (two) times daily.   Yes [provider]  Insulin Glargine-Lixisenatide 100-33 UNT-MCG/ML SOPN Inject 50 Units into the skin every  morning. 02/19/20  Yes [provider]  levalbuterol (XOPENEX) 1.25 MG/3ML nebulizer solution Take 1.25 mg by nebulization every 6 (six) hours as needed. 03/07/20  Yes [provider]  levothyroxine (SYNTHROID) 150 MCG tablet Take 1 tablet (150 mcg total) by mouth daily before breakfast. 07/30/20  Yes Angiulli, Lavon Paganini, PA-C  magnesium oxide (MAG-OX) 400  MG tablet Take 400 mg by mouth 2 (two) times daily. 07/30/20  Yes [provider]  metFORMIN (GLUCOPHAGE-XR) 500 MG 24 hr tablet Take 1 tablet (500 mg total) by mouth 2 (two) times daily. Patient taking differently: Take 1,000 mg by mouth daily. With dinner. 07/30/20  Yes Angiulli, Lavon Paganini, PA-C  methocarbamol (ROBAXIN) 750 MG tablet Take 750 mg by mouth 3 (three) times daily.   Yes [provider]  montelukast (SINGULAIR) 10 MG tablet Take 1 tablet (10 mg total) by mouth at bedtime. 07/30/20  Yes Angiulli, Lavon Paganini, PA-C  pantoprazole (PROTONIX) 40 MG tablet Take 1 tablet (40 mg total) by mouth daily. 07/30/20  Yes Angiulli, Lavon Paganini, PA-C  pregabalin (LYRICA) 100 MG capsule Take by mouth 2 (two) times daily. 09/05/20  Yes [provider]  rosuvastatin (CRESTOR) 10 MG tablet Take 1 tablet (10 mg total) by mouth at bedtime. 07/30/20  Yes Angiulli, Lavon Paganini, PA-C  traMADol (ULTRAM) 50 MG tablet Take 1 tablet (50 mg total) by mouth every 6 (six) hours as needed for moderate pain. 10/10/20  Yes Lovorn, Jinny Blossom, MD  valACYclovir (VALTREX) 1000 MG tablet Take 1 tablet (1,000 mg total) by mouth 2 (two) times daily. 07/30/20  Yes Angiulli, Lavon Paganini, PA-C  Continuous Blood Gluc Sensor (DEXCOM G6 SENSOR) MISC SMARTSIG:1 Each Topical Every 10 Days 08/12/20   [provider]  DULoxetine (CYMBALTA) 30 MG capsule TAKE 1 CAPSULE BY MOUTH AT BEDTIME FOR 1 WEEK, THEN 2 CAPSULES NIGHTLY FOR NERVE PAIN 12/08/20   Lovorn, Jinny Blossom, MD  methocarbamol (ROBAXIN) 500 MG tablet Take 1 tablet (500 mg total) by mouth 3 (three) times daily. Patient not taking: Reported on 11/20/2020 07/30/20   Angiulli, Lavon Paganini, PA-C  budesonide-formoterol [facility]) 160-4.5 MCG/ACT inhaler Inhale into the lungs. 07/14/18 03/15/19  [provider]     Family History  Problem Relation Age of Onset   Cancer Paternal Aunt    Breast cancer Maternal Aunt 70    Social History   Socioeconomic History   Marital status:  Single    Spouse name: Not on file   Number of children: Not on file   Years of education: Not on file   Highest education level: Not on file  Occupational History   Not on file  Tobacco Use   Smoking status: Never   Smokeless tobacco: Never  Vaping Use   Vaping Use: Never used  Substance and Sexual Activity   Alcohol use: Yes    Alcohol/week: 1.0 standard drink    Types: 1 Cans of beer per week   Drug use: No   Sexual activity: Not Currently    Birth control/protection: None  Other Topics Concern   Not on file  Social History Narrative   Not on file   Social Determinants of Health   Financial Resource Strain: Not on file  Food Insecurity: Not on file  Transportation Needs: Not on file  Physical Activity: Not on file  Stress: Not on file  Social Connections: Not on file    Review of Systems: A 12 point ROS discussed and pertinent positives are indicated in the  HPI above.  All other systems are negative.  Review of Systems  Vital Signs: BP 132/76   Pulse 88   Temp (!) 100.6 F (38.1 C) (Oral)   Resp 16   Ht 6' (1.829 m)   Wt 208 lb 1.8 oz (94.4 kg)   SpO2 96%   BMI 28.23 kg/m   Physical Exam HENT:     Head: Normocephalic and atraumatic.  Cardiovascular:     Rate and Rhythm: Normal rate and regular rhythm.  Pulmonary:     Effort: Pulmonary effort is normal. No respiratory distress.  Abdominal:     General: There is no distension.     Palpations: Abdomen is soft.  Skin:    General: Skin is warm and dry.  Neurological:     Mental Status: She is alert and oriented to person, place, and time.   Labs:  CBC: Recent Labs    12/13/20 0515 12/14/20 0008 12/14/20 0528 12/14/20 1537 12/15/20 0523  WBC 6.2  --  8.0 6.4 6.3  HGB 7.6*  --  6.8* 7.8* 7.6*  HCT 22.4*  --  20.3* 23.1* 22.1*  PLT 30* 81* 62* 44* 34*    COAGS: Recent Labs    06/10/20 1126 11/20/20 0602 12/09/20 1020  INR 1.3* 1.0 1.1  APTT 28  --  26    BMP: Recent Labs     01/17/20 1053 05/23/20 1526 12/12/20 0510 12/13/20 0515 12/14/20 0528 12/15/20 0523  NA 140   < > 135 132* 136 136  K 3.3*   < > 3.8 3.9 3.3* 4.2  CL 103   < > 102 98 101 102  CO2 27   < > _0 GLUCOSE 312*   < > 167* 436* 122* 136*  BUN 10   < > 32* 31* 37* 40*  CALCIUM 8.1*   < > 7.3* 7.3* 7.4* 7.4*  CREATININE 0.93   < > 2.80* 2.82* 3.64* 4.43*  GFRNONAA >60   < > 19* 19* 14* 11*  GFRAA >60  --   --   --   --   --    < > = values in this interval not displayed.    LIVER FUNCTION TESTS: Recent Labs    12/12/20 0510 12/13/20 0515 12/14/20 0528 12/15/20 0523  BILITOT 0.9 1.0 0.6 0.6  AST 73* 52* 35 43*  ALT _1 ALKPHOS 78 81 74 73  PROT 4.9* 5.3* 5.3* 4.7*  ALBUMIN 2.1* 2.1* 2.1* 2.0*    Assessment and Plan: History of CLL s/p bone marrow biopsy 811 with no findings and suggestion of repeat biopsy mentioned in chart. Altered mental status and fever of unknown origin, mental status improved, infectious disease work-up with no known etiology and infectious disease has signed off. Acute kidney injury, unknown etiology nephrology has been following with need for image guided renal biopsy, this has been on hold due to thrombocytopenia patient has received multiple platelet transfusions. Rheumatology, oncology and ID have been involved, concern for lupus vs vasculitis vs RPGN IR will check labs tomorrow AM with hopes that platelet count is over 50,000 and INR is under 1.5 after additional transfusions received tonight and will proceed with ultrasound-guided nontargeted renal biopsy tomorrow. Patient will be n.p.o. after midnight, no blood thinners have been taken, labs and vitals have been reviewed.   Risks and benefits of renal biopsy was discussed with the patient and/or patient's family including, but not limited to bleeding which  may require additional procedures or be fatal, infection, damage to adjacent structures or low yield requiring additional  tests.  All of the questions were answered and there is agreement to proceed.  Consent signed and in chart.   Thank you for this interesting consult.  I greatly enjoyed meeting Shenita Trego and look forward to participating in their care.  A copy of this report was sent to the requesting provider on this date.  Electronically Signed: Hedy Jacob, PA-C 12/15/2020, 4:03 PM   I spent a total of 20 Minutes in face to face in clinical consultation, greater than 50% of which was counseling/coordinating care for acute kidney injury.

## 2020-12-15 NOTE — Progress Notes (Signed)
This RN received call back from Dr. Grayland Ormond with Oncology. Discussed platelet transfusion concern for pooled platelet. Dr. Grayland Ormond stated it was ok to proceed with platelet transfusion. Telephone orders given for 650 mg tylenol once PRN and 25 mg benadryl once PRN as platelet transfusion pre-medication. Orders readback and verified.

## 2020-12-15 NOTE — Progress Notes (Signed)
Nutrition Follow-up  DOCUMENTATION CODES:  Not applicable  INTERVENTION:  Continue current diet as ordered, encourage PO intake Snacks TID between meals MVI with minerals daily Boost Breeze po TID, each supplement provides 250 kcal and 9 grams of protein Consider NGT placement to provide nutrition in the context of increased needs if pt continues to have poor PO intake.  NUTRITION DIAGNOSIS:  Inadequate oral intake related to  (altered mental status) as evidenced by meal completion < 25%.  GOAL:  Patient will meet greater than or equal to 90% of their needs - progressing  MONITOR:  PO intake, Supplement acceptance, Labs, Weight trends  REASON FOR ASSESSMENT:  Consult Poor PO  ASSESSMENT:  55 y.o. female with a past medical history of anxiety/depression, CLL, diabetes, HLD, and HTN presented to ED with AMS and poor appetite.  Since last assessment, renal function continues to decline and HD was initiated. Pt pending renal biopsy, but due to risk factors has been deferred. Rituxan initiated for possible lupus.   Pt with poor PO this week. If intake does not improve, NGT will need to be placed to provide adequate nutrition as needs are increased due to HD.  Average Meal Intake: 7/29-8/2: 0% intake x 4 recorded meals 8/3-8/8: 3% intake x 4 recorded meals 8/9-8/14: 38% intake x 4 recorded meals 8/15-8/22: 0% intake x 6 recorded meals  Nutritionally Relevant Medications: Scheduled Meds:  feeding supplement  1 Container Oral TID BM   insulin aspart  0-15 Units Subcutaneous TID WC   insulin aspart  0-5 Units Subcutaneous QHS   levothyroxine  100 mcg Oral QAC breakfast   magnesium oxide  800 mg Oral BID   mirtazapine  15 mg Oral QHS   multivitamin with minerals  1 tablet Oral Daily   pantoprazole  40 mg Oral Daily   psyllium  1 packet Oral Daily   rosuvastatin  10 mg Oral QHS   sodium bicarbonate  650 mg Oral TID   thiamine  100 mg Oral Daily   PRN Meds: loperamide,  ondansetron, polyethylene glycol  Labs Reviewed: Creatinine 4.43, BUN 40 SBG ranges from 141-285 mg/dL over the last 24 hours HgbA1c 6.5% (10/02/20)  NUTRITION - FOCUSED PHYSICAL EXAM: Flowsheet Row Most Recent Value  Orbital Region No depletion  Upper Arm Region No depletion  Thoracic and Lumbar Region No depletion  Buccal Region No depletion  Temple Region No depletion  Clavicle Bone Region Mild depletion  Clavicle and Acromion Bone Region No depletion  Scapular Bone Region No depletion  Dorsal Hand No depletion  Patellar Region No depletion  Anterior Thigh Region No depletion  Posterior Calf Region No depletion  Edema (RD Assessment) Mild  Hair Reviewed  Eyes Reviewed  Mouth Reviewed  Skin Reviewed  Nails Reviewed   Diet Order:   Diet Order             Diet regular Room service appropriate? Yes; Fluid consistency: Thin  Diet effective now                  EDUCATION NEEDS:  No education needs have been identified at this time  Skin:  Skin Assessment: Reviewed RN Assessment  Last BM:  8/22 - type 6  Height:  Ht Readings from Last 1 Encounters:  12/10/20 6' (1.829 m)   Weight:  Wt Readings from Last 1 Encounters:  12/10/20 94.4 kg    Ideal Body Weight:  72.7 kg  BMI:  Body mass index is 28.23 kg/m.  Estimated Nutritional Needs:  Kcal:  2000-2200 kcal/d Protein:  100-110 g/d Fluid:  >2L/d  Ranell Patrick, RD, LDN Clinical Dietitian Pager on [place]

## 2020-12-15 NOTE — Progress Notes (Signed)
MEWS Note:    12/15/20 1139  Assess: MEWS Score  Temp (!) 102.5 F (39.2 C)  BP (!) 144/62  Pulse Rate 92  Resp 17  SpO2 96 %  O2 Device Room Air  Assess: MEWS Score  MEWS Temp 2  MEWS Systolic 0  MEWS Pulse 0  MEWS RR 0  MEWS LOC 0  MEWS Score 2  MEWS Score Color Yellow  Assess: if the MEWS score is Yellow or Red  Were vital signs taken at a resting state? Yes  Focused Assessment No change from prior assessment  Does the patient meet 2 or more of the SIRS criteria? No  Does the patient have a confirmed or suspected source of infection? No  Provider and Rapid Response Notified? No  MEWS guidelines implemented *See Row Information* No, previously yellow, continue vital signs every 4 hours  Treat  Pain Scale 0-10  Pain Score 0  Assess: SIRS CRITERIA  SIRS Temperature  1  SIRS Pulse 1  SIRS Respirations  0  SIRS WBC 0  SIRS Score Sum  2

## 2020-12-16 ENCOUNTER — Inpatient Hospital Stay: Payer: Medicare Other

## 2020-12-16 DIAGNOSIS — D8989 Other specified disorders involving the immune mechanism, not elsewhere classified: Secondary | ICD-10-CM | POA: Diagnosis not present

## 2020-12-16 DIAGNOSIS — R509 Fever, unspecified: Secondary | ICD-10-CM | POA: Diagnosis not present

## 2020-12-16 DIAGNOSIS — M329 Systemic lupus erythematosus, unspecified: Secondary | ICD-10-CM | POA: Diagnosis not present

## 2020-12-16 DIAGNOSIS — C9110 Chronic lymphocytic leukemia of B-cell type not having achieved remission: Secondary | ICD-10-CM | POA: Diagnosis not present

## 2020-12-16 DIAGNOSIS — I776 Arteritis, unspecified: Secondary | ICD-10-CM

## 2020-12-16 LAB — BPAM RBC
Blood Product Expiration Date: 202208232359
Blood Product Expiration Date: 202208252359
Blood Product Expiration Date: 202208302359
Blood Product Expiration Date: 202209072359
ISSUE DATE / TIME: 202208181424
ISSUE DATE / TIME: 202208211109
Unit Type and Rh: 9500
Unit Type and Rh: 9500
Unit Type and Rh: 9500
Unit Type and Rh: 9500

## 2020-12-16 LAB — COMPREHENSIVE METABOLIC PANEL
ALT: 15 U/L (ref 0–44)
AST: 53 U/L — ABNORMAL HIGH (ref 15–41)
Albumin: 2 g/dL — ABNORMAL LOW (ref 3.5–5.0)
Alkaline Phosphatase: 78 U/L (ref 38–126)
Anion gap: 7 (ref 5–15)
BUN: 43 mg/dL — ABNORMAL HIGH (ref 6–20)
CO2: 30 mmol/L (ref 22–32)
Calcium: 7.4 mg/dL — ABNORMAL LOW (ref 8.9–10.3)
Chloride: 100 mmol/L (ref 98–111)
Creatinine, Ser: 4.99 mg/dL — ABNORMAL HIGH (ref 0.44–1.00)
GFR, Estimated: 10 mL/min — ABNORMAL LOW (ref >60)
Glucose, Bld: 119 mg/dL — ABNORMAL HIGH (ref 70–99)
Potassium: 4 mmol/L (ref 3.5–5.1)
Sodium: 137 mmol/L (ref 135–145)
Total Bilirubin: 0.7 mg/dL (ref 0.3–1.2)
Total Protein: 4.6 g/dL — ABNORMAL LOW (ref 6.5–8.1)

## 2020-12-16 LAB — PREPARE PLATELET PHERESIS
Unit division: 0
Unit division: 0

## 2020-12-16 LAB — TYPE AND SCREEN
ABO/RH(D): O POS
Antibody Screen: POSITIVE
Unit division: 0
Unit division: 0
Unit division: 0
Unit division: 0

## 2020-12-16 LAB — CBC WITH DIFFERENTIAL/PLATELET
Abs Immature Granulocytes: 0.07 10*3/uL (ref 0.00–0.07)
Basophils Absolute: 0 10*3/uL (ref 0.0–0.1)
Basophils Relative: 0 %
Eosinophils Absolute: 0.1 10*3/uL (ref 0.0–0.5)
Eosinophils Relative: 1 %
HCT: 20.2 % — ABNORMAL LOW (ref 36.0–46.0)
Hemoglobin: 6.7 g/dL — ABNORMAL LOW (ref 12.0–15.0)
Immature Granulocytes: 1 %
Lymphocytes Relative: 22 %
Lymphs Abs: 1.7 10*3/uL (ref 0.7–4.0)
MCH: 27.9 pg (ref 26.0–34.0)
MCHC: 33.2 g/dL (ref 30.0–36.0)
MCV: 84.2 fL (ref 80.0–100.0)
Monocytes Absolute: 0.2 10*3/uL (ref 0.1–1.0)
Monocytes Relative: 3 %
Neutro Abs: 5.5 10*3/uL (ref 1.7–7.7)
Neutrophils Relative %: 73 %
Platelets: 57 10*3/uL — ABNORMAL LOW (ref 150–400)
RBC: 2.4 MIL/uL — ABNORMAL LOW (ref 3.87–5.11)
RDW: 16.1 % — ABNORMAL HIGH (ref 11.5–15.5)
Smear Review: NORMAL
WBC: 7.6 10*3/uL (ref 4.0–10.5)
nRBC: 0 % (ref 0.0–0.2)

## 2020-12-16 LAB — GLUCOSE, CAPILLARY
Glucose-Capillary: 129 mg/dL — ABNORMAL HIGH (ref 70–99)
Glucose-Capillary: 128 mg/dL — ABNORMAL HIGH (ref 70–99)
Glucose-Capillary: 149 mg/dL — ABNORMAL HIGH (ref 70–99)
Glucose-Capillary: 137 mg/dL — ABNORMAL HIGH (ref 70–99)

## 2020-12-16 LAB — MAGNESIUM: Magnesium: 2 mg/dL (ref 1.7–2.4)

## 2020-12-16 LAB — ADAMTS13 ACTIVITY REFLEX

## 2020-12-16 LAB — SURGICAL PATHOLOGY

## 2020-12-16 LAB — BPAM PLATELET PHERESIS
Blood Product Expiration Date: 202208222359
Blood Product Expiration Date: 202208232359
ISSUE DATE / TIME: 202208221208
ISSUE DATE / TIME: 202208221822
Unit Type and Rh: 6200
Unit Type and Rh: 5100

## 2020-12-16 LAB — PROTIME-INR
INR: 1.1 (ref 0.8–1.2)
Prothrombin Time: 14.4 seconds (ref 11.4–15.2)

## 2020-12-16 LAB — LACTATE DEHYDROGENASE: LDH: 365 U/L — ABNORMAL HIGH (ref 98–192)

## 2020-12-16 LAB — PREPARE RBC (CROSSMATCH)

## 2020-12-16 LAB — ADAMTS13 ACTIVITY: Adamts 13 Activity: 50.9 % — ABNORMAL LOW (ref >66.8)

## 2020-12-16 MED ORDER — SODIUM CHLORIDE 0.9% IV SOLUTION: Freq: Once | INTRAVENOUS | Status: AC

## 2020-12-16 MED ORDER — MIDAZOLAM HCL 2 MG/2ML IJ SOLN
INTRAMUSCULAR | Status: AC | PRN
  Administered 2020-12-16: 0.5 mg via INTRAVENOUS

## 2020-12-16 MED ORDER — ACETAMINOPHEN 325 MG PO TABS
650.0000 mg | ORAL_TABLET | Freq: Once | ORAL | Status: AC
  Administered 2020-12-16: 08:00:00 650 mg via ORAL
  Filled 2020-12-16: qty 2

## 2020-12-16 MED ORDER — MIDAZOLAM HCL 2 MG/2ML IJ SOLN
INTRAMUSCULAR | Status: AC
  Filled 2020-12-16: qty 2

## 2020-12-16 MED ORDER — ORAL CARE MOUTH RINSE
15.0000 mL | Freq: Two times a day (BID) | OROMUCOSAL | Status: DC
  Administered 2020-12-17 – 2020-12-22 (×10): 15 mL via OROMUCOSAL

## 2020-12-16 MED ORDER — HYDRALAZINE HCL 20 MG/ML IJ SOLN
INTRAMUSCULAR | Status: AC
  Filled 2020-12-16: qty 1

## 2020-12-16 MED ORDER — DIPHENHYDRAMINE HCL 25 MG PO CAPS
25.0000 mg | ORAL_CAPSULE | Freq: Once | ORAL | Status: AC
  Administered 2020-12-16: 08:00:00 25 mg via ORAL
  Filled 2020-12-16: qty 1

## 2020-12-16 MED ORDER — FENTANYL CITRATE (PF) 100 MCG/2ML IJ SOLN
INTRAMUSCULAR | Status: AC | PRN
  Administered 2020-12-16: 25 ug via INTRAVENOUS

## 2020-12-16 MED ORDER — FENTANYL CITRATE (PF) 100 MCG/2ML IJ SOLN
INTRAMUSCULAR | Status: AC
  Filled 2020-12-16: qty 2

## 2020-12-16 NOTE — Progress Notes (Signed)
Pt beginning to clot in dialyzer. Small ns flushes of 50 ml given x3 without improvement. Dr. Juleen China notified, treatment discontinued 11 min early.

## 2020-12-16 NOTE — Progress Notes (Signed)
[facility]  ROUNDING NOTE   Subjective:   Patient seen resting on right side after renal biopsy Alert, oriented Minimal discomfort  Platelets up to 57 Creatinine 4.99 (4.43) (3.64) (2.82)  Tmax 100.74F   Objective:  Vital signs in last 24 hours:  Temp:  [99.1 F (37.3 C)-102.5 F (39.2 C)] 99.1 F (37.3 C) (08/23 1211) Pulse Rate:  [69-88] 73 (08/23 1211) Resp:  [15-20] 16 (08/23 1211) BP: (115-144)/(64-79) 117/79 (08/23 1211) SpO2:  [90 %-99 %] 97 % (08/23 1211)  Weight change:  Filed Weights   12/01/20 0920 12/01/20 1730 12/10/20 1345  Weight: 94.9 kg 94.4 kg 94.4 kg    Intake/Output: I/O last 3 completed shifts: In: 1362.8 [P.O.:540; I.V.:30.8; Blood:792] Out: 275 [Urine:275]   Intake/Output this shift:  Total I/O In: 380 [Blood:380] Out: -   Physical Exam: General: NAD, laying in bed  Head: Normocephalic, atraumatic. Moist oral mucosal membranes  Eyes: Anicteric  Lungs:  Clear to auscultation, normal effort  Heart: Regular rate and rhythm  Abdomen:  Soft, nontender  Extremities: 1+ peripheral edema.  Neurologic: Alert, moving all four extremities  Skin: No lesions  Access RUE femoral temp cath 8/17 Schnier (bloody drainage)    Basic Metabolic Panel: Recent Labs  Lab 12/12/20 0510 12/13/20 0515 12/14/20 0528 12/15/20 0523 12/16/20 0516  NA 135 132* 136 136 137  K 3.8 3.9 3.3* 4.2 4.0  CL 102 98 101 102 100  CO2 29 27 29 30 30   GLUCOSE 167* 436* 122* 136* 119*  BUN 32* 31* 37* 40* 43*  CREATININE 2.80* 2.82* 3.64* 4.43* 4.99*  CALCIUM 7.3* 7.3* 7.4* 7.4* 7.4*  MG 1.8 1.9 1.9 1.9 2.0     Liver Function Tests: Recent Labs  Lab 12/12/20 0510 12/13/20 0515 12/14/20 0528 12/15/20 0523 12/16/20 0516  AST 73* 52* 35 43* 53*  ALT 26 17 12 13 15   ALKPHOS 78 81 74 73 78  BILITOT 0.9 1.0 0.6 0.6 0.7  PROT 4.9* 5.3* 5.3* 4.7* 4.6*  ALBUMIN 2.1* 2.1* 2.1* 2.0* 2.0*    No results for input(s): LIPASE, AMYLASE in the last 168  hours.  No results for input(s): AMMONIA in the last 168 hours.  CBC: Recent Labs  Lab 12/13/20 0515 12/14/20 0008 12/14/20 0528 12/14/20 1537 12/15/20 0523 12/15/20 2142 12/16/20 0516  WBC 6.2  --  8.0 6.4 6.3  --  7.6  NEUTROABS 4.8  --  5.9 4.8 4.7  --  5.5  HGB 7.6*  --  6.8* 7.8* 7.6*  --  6.7*  HCT 22.4*  --  20.3* 23.1* 22.1*  --  20.2*  MCV 81.2  --  82.2 82.5 81.5  --  84.2  PLT 30*   < > 62* 44* 34* 65* 57*   < > = values in this interval not displayed.     Cardiac Enzymes: No results for input(s): CKTOTAL, CKMB, CKMBINDEX, TROPONINI in the last 168 hours.   BNP: Invalid input(s): POCBNP  CBG: Recent Labs  Lab 12/15/20 1150 12/15/20 1645 12/15/20 2040 12/16/20 0807 12/16/20 1212  GLUCAP 285* 288* 235* 129* 128*     Microbiology: Results for orders placed or performed during the hospital encounter of 11/19/20  Blood culture (routine x 2)     Status: None   Collection Time: 11/19/20  4:47 PM   Specimen: BLOOD  Result Value Ref Range Status   Specimen Description BLOOD RIGHT ANTECUBITAL  Final   Special Requests   Final  BOTTLES DRAWN AEROBIC AND ANAEROBIC Blood Culture adequate volume   Culture   Final    NO GROWTH 5 DAYS Performed at [facility], [address] [place], [place] [address]    Report Status 11/24/2020 FINAL  Final  Resp Panel by RT-PCR (Flu A&B, Covid) Nasopharyngeal Swab     Status: None   Collection Time: 11/19/20  4:48 PM   Specimen: Nasopharyngeal Swab; Nasopharyngeal(NP) swabs in vial transport medium  Result Value Ref Range Status   SARS Coronavirus 2 by RT PCR NEGATIVE NEGATIVE Final    Comment: (NOTE) SARS-CoV-2 target nucleic acids are NOT DETECTED.  The SARS-CoV-2 RNA is generally detectable in upper respiratory specimens during the acute phase of infection. The lowest concentration of SARS-CoV-2 viral copies this assay can detect is 138 copies/mL. A negative result does not preclude  SARS-Cov-2 infection and should not be used as the sole basis for treatment or other patient management decisions. A negative result may occur with  improper specimen collection/handling, submission of specimen other than nasopharyngeal swab, presence of viral mutation(s) within the areas targeted by this assay, and inadequate number of viral copies(<138 copies/mL). A negative result must be combined with clinical observations, patient history, and epidemiological information. The expected result is Negative.  Fact Sheet for Patients:  EntrepreneurPulse.com.au  Fact Sheet for Healthcare Providers:  IncredibleEmployment.be  This test is no t yet approved or cleared by the [place] FDA and  has been authorized for detection and/or diagnosis of SARS-CoV-2 by FDA under an Emergency Use Authorization (EUA). This EUA will remain  in effect (meaning this test can be used) for the duration of the COVID-19 declaration under Section 564(b)(1) of the Act, 21 U.S.C.section 360bbb-3(b)(1), unless the authorization is terminated  or revoked sooner.       Influenza A by PCR NEGATIVE NEGATIVE Final   Influenza B by PCR NEGATIVE NEGATIVE Final    Comment: (NOTE) The Xpert Xpress SARS-CoV-2/FLU/RSV plus assay is intended as an aid in the diagnosis of influenza from Nasopharyngeal swab specimens and should not be used as a sole basis for treatment. Nasal washings and aspirates are unacceptable for Xpert Xpress SARS-CoV-2/FLU/RSV testing.  Fact Sheet for Patients: EntrepreneurPulse.com.au  Fact Sheet for Healthcare Providers: IncredibleEmployment.be  This test is not yet approved or cleared by the [place] FDA and has been authorized for detection and/or diagnosis of SARS-CoV-2 by FDA under an Emergency Use Authorization (EUA). This EUA will remain in effect (meaning this test can be used) for the duration of  the COVID-19 declaration under Section 564(b)(1) of the Act, 21 U.S.C. section 360bbb-3(b)(1), unless the authorization is terminated or revoked.  Performed at [facility], [address] [place], [place] [address]   Blood culture (routine x 2)     Status: None   Collection Time: 11/19/20  6:46 PM   Specimen: BLOOD  Result Value Ref Range Status   Specimen Description BLOOD RIGHT ANTECUBITAL  Final   Special Requests   Final    BOTTLES DRAWN AEROBIC AND ANAEROBIC Blood Culture adequate volume   Culture   Final    NO GROWTH 5 DAYS Performed at [facility], [address], [place],  [address]    Report Status 11/24/2020 FINAL  Final  Urine Culture     Status: Abnormal   Collection Time: 11/19/20  9:33 PM   Specimen: Urine, Clean Catch  Result Value Ref Range Status   Specimen Description   Final  URINE, CLEAN CATCH Performed at [facility], [address] [place], [place] [address]    Special Requests   Final    NONE Performed at [facility], [address] [place], [place] [address]    Culture (A)  Final    <10,000 COLONIES/mL INSIGNIFICANT GROWTH Performed at [place] [address], [place], [place] [address]    Report Status 11/21/2020 FINAL  Final  Culture, fungus without smear     Status: None   Collection Time: 11/20/20 11:09 AM   Specimen: CSF; Cerebrospinal Fluid  Result Value Ref Range Status   Specimen Description   Final    CSF Performed at [facility], [address], [place], [place] [address]    Special Requests   Final    NONE Performed at [facility], [address] [place], [place] [address]    Culture   Final    NO FUNGUS ISOLATED AFTER 21 DAYS Performed at [facility], [place] [address] [place], [place] [address]    Report Status 12/11/2020 FINAL  Final  CSF culture w Gram Stain     Status: None   Collection Time: 11/20/20 11:09 AM   Specimen: PATH  Cytology CSF; Cerebrospinal Fluid  Result Value Ref Range Status   Specimen Description   Final    CSF Performed at [facility], [address] [place], [place] [address]    Special Requests   Final    NONE Performed at [facility], [address] [place], [place] [address]    Gram Stain   Final    NO ORGANISMS SEEN WBC SEEN RED BLOOD CELLS PRESENT Performed at [facility], [address] [place], [place] [address]    Culture   Final    NO GROWTH 3 DAYS Performed at [facility], [place] [address] [place], [place] [address]    Report Status 11/23/2020 FINAL  Final  Fungus Culture With Stain     Status: None (Preliminary result)   Collection Time: 11/20/20 11:09 AM   Specimen: PATH Cytology CSF; Cerebrospinal Fluid  Result Value Ref Range Status   Fungus Stain Final report  Final    Comment: (NOTE) Performed At: [facility] [place], [place] [id number] Rush Farmer MD ID:[id number]    Fungus (Mycology) Culture PENDING  Incomplete   Fungal Source CSF  Final    Comment: Performed at [facility], [address] [place], [place] [address]  Fungus Culture Result     Status: None   Collection Time: 11/20/20 11:09 AM  Result Value Ref Range Status   Result 1 Comment  Final    Comment: (NOTE) KOH/Calcofluor preparation:  no fungus observed. Performed At: [facility] [place], [place] [id number] Rush Farmer MD JK:[id number]   Resp Panel by RT-PCR (Flu A&B, Covid) Nasopharyngeal Swab     Status: None   Collection Time: 11/27/20  1:39 PM   Specimen: Nasopharyngeal Swab; Nasopharyngeal(NP) swabs in vial transport medium  Result Value Ref Range Status   SARS Coronavirus 2 by RT PCR NEGATIVE NEGATIVE Final    Comment: (NOTE) SARS-CoV-2 target nucleic acids are NOT DETECTED.  The SARS-CoV-2 RNA is generally detectable in upper respiratory specimens during the acute phase of  infection. The lowest concentration of SARS-CoV-2 viral copies this assay can detect is 138 copies/mL. A negative result does not preclude SARS-Cov-2 infection and should not be used as  the sole basis for treatment or other patient management decisions. A negative result may occur with  improper specimen collection/handling, submission of specimen other than nasopharyngeal swab, presence of viral mutation(s) within the areas targeted by this assay, and inadequate number of viral copies(<138 copies/mL). A negative result must be combined with clinical observations, patient history, and epidemiological information. The expected result is Negative.  Fact Sheet for Patients:  EntrepreneurPulse.com.au  Fact Sheet for Healthcare Providers:  IncredibleEmployment.be  This test is no t yet approved or cleared by the [place] FDA and  has been authorized for detection and/or diagnosis of SARS-CoV-2 by FDA under an Emergency Use Authorization (EUA). This EUA will remain  in effect (meaning this test can be used) for the duration of the COVID-19 declaration under Section 564(b)(1) of the Act, 21 U.S.C.section 360bbb-3(b)(1), unless the authorization is terminated  or revoked sooner.       Influenza A by PCR NEGATIVE NEGATIVE Final   Influenza B by PCR NEGATIVE NEGATIVE Final    Comment: (NOTE) The Xpert Xpress SARS-CoV-2/FLU/RSV plus assay is intended as an aid in the diagnosis of influenza from Nasopharyngeal swab specimens and should not be used as a sole basis for treatment. Nasal washings and aspirates are unacceptable for Xpert Xpress SARS-CoV-2/FLU/RSV testing.  Fact Sheet for Patients: EntrepreneurPulse.com.au  Fact Sheet for Healthcare Providers: IncredibleEmployment.be  This test is not yet approved or cleared by the [place] FDA and has been authorized for detection and/or diagnosis of SARS-CoV-2  by FDA under an Emergency Use Authorization (EUA). This EUA will remain in effect (meaning this test can be used) for the duration of the COVID-19 declaration under Section 564(b)(1) of the Act, 21 U.S.C. section 360bbb-3(b)(1), unless the authorization is terminated or revoked.  Performed at [facility], [address] [place], [place] [address]   Urine Culture     Status: Abnormal   Collection Time: 11/27/20  2:15 PM   Specimen: Urine, Clean Catch  Result Value Ref Range Status   Specimen Description   Final    URINE, CLEAN CATCH Performed at [facility], [address] [place], [place] [address]    Special Requests   Final    NONE Performed at [facility], [place], [place] [address]    Culture 20,000 COLONIES/mL ENTEROBACTER AEROGENES (A)  Final   Report Status 11/30/2020 FINAL  Final   Organism ID, Bacteria ENTEROBACTER AEROGENES (A)  Final      Susceptibility   Enterobacter aerogenes - MIC*    CEFAZOLIN >=64 RESISTANT Resistant     CEFEPIME 1 SENSITIVE Sensitive     CEFTRIAXONE >=64 RESISTANT Resistant     CIPROFLOXACIN <=0.25 SENSITIVE Sensitive     GENTAMICIN <=1 SENSITIVE Sensitive     IMIPENEM <=0.25 SENSITIVE Sensitive     NITROFURANTOIN 64 INTERMEDIATE Intermediate     TRIMETH/SULFA <=20 SENSITIVE Sensitive     PIP/TAZO >=128 RESISTANT Resistant     * 20,000 COLONIES/mL ENTEROBACTER AEROGENES  CULTURE, BLOOD (ROUTINE X 2) w Reflex to ID Panel     Status: None   Collection Time: 11/27/20  8:23 PM   Specimen: BLOOD  Result Value Ref Range Status   Specimen Description BLOOD LAC  Final   Special Requests   Final    BOTTLES DRAWN AEROBIC AND ANAEROBIC Blood Culture results may not be optimal due to an inadequate volume of blood received in culture bottles   Culture   Final  NO GROWTH 5 DAYS Performed at [facility], [address] [place], [place] [address]    Report Status 12/02/2020 FINAL  Final   CULTURE, BLOOD (ROUTINE X 2) w Reflex to ID Panel     Status: None   Collection Time: 11/27/20  9:32 PM   Specimen: BLOOD LEFT HAND  Result Value Ref Range Status   Specimen Description BLOOD LEFT HAND  Final   Special Requests   Final    BOTTLES DRAWN AEROBIC AND ANAEROBIC Blood Culture adequate volume   Culture   Final    NO GROWTH 5 DAYS Performed at [facility], [address] [place], [place] [address]    Report Status 12/02/2020 FINAL  Final  CULTURE, BLOOD (ROUTINE X 2) w Reflex to ID Panel     Status: None   Collection Time: 12/06/20 10:35 AM   Specimen: BLOOD  Result Value Ref Range Status   Specimen Description BLOOD LT KNUCKLE  Final   Special Requests   Final    BOTTLES DRAWN AEROBIC AND ANAEROBIC Blood Culture adequate volume   Culture   Final    NO GROWTH 5 DAYS Performed at [facility], [address] [place], [place] [address]    Report Status 12/11/2020 FINAL  Final  CULTURE, BLOOD (ROUTINE X 2) w Reflex to ID Panel     Status: None   Collection Time: 12/06/20 10:35 AM   Specimen: BLOOD  Result Value Ref Range Status   Specimen Description BLOOD BLOOD LEFT WRIST  Final   Special Requests AEROBIC BOTTLE ONLY Blood Culture adequate volume  Final   Culture   Final    NO GROWTH 5 DAYS Performed at [facility], [address] [place], [place] [address]    Report Status 12/11/2020 FINAL  Final  Culture, blood (single) w Reflex to ID Panel     Status: None (Preliminary result)   Collection Time: 12/14/20  2:36 PM   Specimen: BLOOD  Result Value Ref Range Status   Specimen Description BLOOD BLOOD RIGHT HAND  Final   Special Requests   Final    BOTTLES DRAWN AEROBIC AND ANAEROBIC Blood Culture adequate volume   Culture   Final    NO GROWTH 2 DAYS Performed at [facility], [address] [place], [place] [address]    Report Status PENDING  Incomplete  Culture, blood (single) w Reflex to ID Panel     Status: None  (Preliminary result)   Collection Time: 12/14/20  3:38 PM   Specimen: BLOOD  Result Value Ref Range Status   Specimen Description BLOOD A-LINE DRAW  Final   Special Requests   Final    BOTTLES DRAWN AEROBIC AND ANAEROBIC Blood Culture adequate volume   Culture   Final    NO GROWTH 2 DAYS Performed at [facility], [address], [place], [place] [address]    Report Status PENDING  Incomplete    Coagulation Studies: Recent Labs    12/16/20 0516  LABPROT 14.4  INR 1.1     Urinalysis: Recent Labs    12/15/20 1754  COLORURINE YELLOW*  LABSPEC 1.013  PHURINE 9.0*  GLUCOSEU 50*  HGBUR LARGE*  BILIRUBINUR NEGATIVE  KETONESUR NEGATIVE  PROTEINUR 100*  NITRITE NEGATIVE  LEUKOCYTESUR SMALL*       Imaging: US BIOPSY (KIDNEY)  Result Date: 12/16/2020 CLINICAL DATA:  Kidney injury INDICATION: Kidney injury EXAM: Ultrasound-guided random renal biopsy MEDICATIONS: None. ANESTHESIA/SEDATION: Moderate (conscious) sedation was  employed during this procedure. A total of Versed 0.5 mg and Fentanyl 25 mcg was administered intravenously. Moderate Sedation Time: 12 minutes. The patient's level of consciousness and vital signs were monitored continuously by radiology nursing throughout the procedure under my direct supervision. FLUOROSCOPY TIME:  N/a COMPLICATIONS: None immediate. PROCEDURE: Informed written consent was obtained from the patient after a thorough discussion of the procedural risks, benefits and alternatives. All questions were addressed. Maximal Sterile Barrier Technique was utilized including caps, mask, sterile gowns, sterile gloves, sterile drape, hand hygiene and skin antiseptic. A timeout was performed prior to the initiation of the procedure. The patient was placed supine on the exam table. Limited ultrasound of the bilateral flanks was performed to delineate anatomy. The left lower pole was selected as an appropriate site for random renal biopsy. The overlying  skin was marked, prepped and draped in a standard sterile fashion. Local analgesia was obtained with 1% lidocaine. Using ultrasound guidance, a 15 gauge introducer needle was advanced into the left renal pole cortex. Subsequently, core needle biopsy was performed using a 16 gauge core biopsy device x2 passes. Gel-Foam slurry was administered through the introducer needle as it was removed. Limited postprocedure imaging demonstrated expected biopsy changes without large or expanding hematoma. A clean dressing was placed. The patient tolerated the procedure well without immediate complication. IMPRESSION: Successful ultrasound-guided random renal biopsy of the left renal lower pole. Electronically Signed   By: Albin Felling M.D.   On: 12/16/2020 11:51     Medications:       sodium chloride   Intravenous Once   amLODipine  5 mg Oral Daily   Chlorhexidine Gluconate Cloth  6 each Topical Daily   diltiazem  120 mg Oral Daily   feeding supplement  1 Container Oral TID BM   insulin aspart  0-15 Units Subcutaneous TID WC   insulin aspart  0-5 Units Subcutaneous QHS   levothyroxine  100 mcg Oral QAC breakfast   magnesium oxide  800 mg Oral BID   mirtazapine  15 mg Oral QHS   multivitamin with minerals  1 tablet Oral Daily   pantoprazole  40 mg Oral Daily   pneumococcal 13-valent conjugate vaccine  0.5 mL Intramuscular Tomorrow-1000   psyllium  1 packet Oral Daily   rosuvastatin  10 mg Oral QHS   sodium bicarbonate  650 mg Oral TID   sodium chloride flush  10-40 mL Intracatheter Q12H   thiamine  100 mg Oral Daily   acetaminophen **OR** acetaminophen, albuterol, haloperidol lactate, loperamide, ondansetron **OR** ondansetron (ZOFRAN) IV, oxyCODONE, polyethylene glycol, sodium chloride flush  Assessment/ Plan:  [name] is a 55 y.o. black female with diabetes mellitus type II, hypertension, hypothyroidism, CLL who was admitted to [facility] on 11/19/2020 for Altered mental status  [R41.82] Encephalopathy acute [G93.40] Altered mental status, unspecified altered mental status type [R41.82] Sepsis with encephalopathy without septic shock, due to unspecified organism ([place]) [A41.9, R65.20, G93.40]  Acute Kidney Injury with hematuria and proteinuria with baseline creatinine 0.49 on 11/22/20.  IV contrast exposure on November 21, 2020.  Renal ultrasound negative for mass and obstruction.  Serologic studies: ANA+, ENA+, antichromatin +, anti- Ro_, P-ANCA + Completed 3 days of high dose IV steroids Rituximab on 8/17. Second dose in 4 weeks.  - Appreciate IR performing renal biopsy today. Expect preliminary results in 1-2 days. Will continue to monitor for bleeding due to decreased platelets. Treatment plan pending biopsy results - Scheduled for dialysis today, UF goal 1L as  tol - Dialysis coordinator arranging outpatient chair request for continued treatment.     Lab Results  Component Value Date   CREATININE 4.99 (H) 12/16/2020   CREATININE 4.43 (H) 12/15/2020   CREATININE 3.64 (H) 12/14/2020    Intake/Output Summary (Last 24 hours) at 12/16/2020 1231 Last data filed at 12/16/2020 0934 Gross per 24 hour  Intake 1552.83 ml  Output 75 ml  Net 1477.83 ml    2. Anemia  with history of CLL and thrombocytopenia Lab Results  Component Value Date   HGB 6.7 (L) 12/16/2020   Bone marrow biopsy 12/04/20, limited marrow material with trilineage hematopoiesis. Recommending repeat bone marrow biopsy Appreciate hematology input.  Now with bleeding from catheter site.  - Numerous platelet transfusions - Appreciate vascular's evaluation and applying additional stitches. Will continue to monitor.   3. Fever of unknown origin: not currently on antibiotics. Receiving blood products and rituximab. Most recent cultures are negative.       LOS: Nathalie 8/23/202212:31 PM

## 2020-12-16 NOTE — Progress Notes (Signed)
Pt received 376mL of prbc, rate was entered incorrectly at 292ml/hr when it was 339ml/hr.

## 2020-12-16 NOTE — Progress Notes (Signed)
   12/16/20 0808  Assess: MEWS Score  Temp (!) 102.5 F (39.2 C) (MD aware)  BP 134/69  Pulse Rate 79  Resp 18  SpO2 99 %  O2 Device Room Air  Assess: MEWS Score  MEWS Temp 2  MEWS Systolic 0  MEWS Pulse 0  MEWS RR 0  MEWS LOC 0  MEWS Score 2  MEWS Score Color Yellow  Assess: if the MEWS score is Yellow or Red  Were vital signs taken at a resting state? Yes  Focused Assessment Change from prior assessment (see assessment flowsheet)  Does the patient meet 2 or more of the SIRS criteria? No  Does the patient have a confirmed or suspected source of infection? Yes  Provider and Rapid Response Notified? No (MD notified)  MEWS guidelines implemented *See Row Information* Yes  Treat  MEWS Interventions Administered scheduled meds/treatments  Pain Scale 0-10  Pain Score 0  Take Vital Signs  Increase Vital Sign Frequency  Yellow: Q 2hr X 2 then Q 4hr X 2, if remains yellow, continue Q 4hrs  Escalate  MEWS: Escalate Yellow: discuss with charge nurse/RN and consider discussing with provider and RRT  Notify: Charge Nurse/RN  Name of Charge Nurse/RN Notified Debi Pinkerton RN  Date Charge Nurse/RN Notified 12/16/20  Time Charge Nurse/RN Notified [id number]  Notify: Provider  Provider Name/Title Dr Sabino Gasser  Date Provider Notified 12/16/20  Time Provider Notified [phone]  Notification Type Page  Notification Reason Other (Comment) (Fever preblood)  Provider response Other (Comment) (acknowledged, pre meds given)  Date of Provider Response 12/16/20  Time of Provider Response [phone]  Document  Patient Outcome Stabilized after interventions  Progress note created (see row info) Yes  Assess: SIRS CRITERIA  SIRS Temperature  1  SIRS Pulse 0  SIRS Respirations  0  SIRS WBC 0  SIRS Score Sum  1

## 2020-12-16 NOTE — Progress Notes (Signed)
Blood checked with Siri Cole, rn prior to initiation.

## 2020-12-16 NOTE — Progress Notes (Signed)
Patient clinically stable post Renal biopsy per Dr Denna Haggard, tolerated well. Denies complaints post procedure. Vitals have remained stable pre and post procedure. Awake/alert and oriented post procedure. No visible bleeding post procedure on [place], post procedure/recovery returned to room with bedside report given to Siri Cole Rn on 1C with questions answered.

## 2020-12-16 NOTE — Progress Notes (Signed)
Date of Admission:  11/19/2020      ID: Tonya Myers is a 55 y.o. 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[place]) Active Problems:   Personal history of CLL (chronic lymphocytic leukemia)   DDD (degenerative disc disease), cervical   Hypothyroidism   Controlled type 2 diabetes mellitus without complication, without long-term current use of insulin (HCC)   Essential hypertension   Hyperlipidemia, mixed   Acute metabolic encephalopathy   AKI (acute kidney injury) ([place])   Hypomagnesemia   Anemia of chronic disease   Hypokalemia   Thrombocytopenia (HCC)   FUO (fever of unknown origin)   Pancytopenia ([place])   Oral thrush  Subjective Patient underwent renal biopsy today Says she is a little off today She received Versed and fentanyl for the procedure. Has fever No cough No shortness of breath No nausea Appetite is poor    Date 7/27 7/28 7/29 7/30 7/31 8/1 8/2 8/3  Tmax /- 102.6 98.8 98.9 100.4 99.5 99.9 99.5 103  antibiotic Cef,vanco,acyclovir CTX,VAN CTX/VAN CTX/Va          steroid Decadron Decadron Decadron Decadron          culture BLD-N CSF-N              Date 8/4 8/5 8/6 8/7 8/8 8/9 8/10 8/11  TMAX 98.6 99.7 102.3 101 100.7 100.6 102.1 100.2  ABX PICC     cipro Cipr doxy Cipr doxy doxy    Culture BLD-N Urine-EA                DATE 12/05/20  8/13-8/17  8/18  8/21-8/23          TMAX 101.8  Normal  100.9  102.3          ABX                  culture                  High dose solumedrol 8/12-8/14 12/12/20 1 gram of Rituximab     Medications:   amLODipine  5 mg Oral Daily   Chlorhexidine Gluconate Cloth  6 each Topical Daily   diltiazem  120 mg Oral Daily   feeding supplement  1 Container Oral TID BM   insulin aspart  0-15 Units Subcutaneous TID WC   insulin aspart  0-5 Units Subcutaneous QHS   levothyroxine  100 mcg Oral QAC breakfast   magnesium oxide  800 mg Oral BID   mirtazapine  15 mg Oral QHS   multivitamin with minerals  1 tablet Oral  Daily   pantoprazole  40 mg Oral Daily   pneumococcal 13-valent conjugate vaccine  0.5 mL Intramuscular Tomorrow-1000   psyllium  1 packet Oral Daily   rosuvastatin  10 mg Oral QHS   sodium bicarbonate  650 mg Oral TID   sodium chloride flush  10-40 mL Intracatheter Q12H   thiamine  100 mg Oral Daily    Objective: Vital signs in last 24 hours: Temp:  [99 F (37.2 C)-102.5 F (39.2 C)] 99.3 F (37.4 C) (08/23 1656) Pulse Rate:  [58-81] 58 (08/23 1736) Resp:  [12-20] 15 (08/23 1736) BP: (115-143)/(64-82) 140/77 (08/23 1736) SpO2:  [90 %-99 %] 98 % (08/23 1611)  PHYSICAL EXAM:  General: Awake, alert, oriented in place person time. Chronically ill, pale Eyes: Conjunctivae clear, anicteric sclerae. Pupils are equal ENT Nares normal. No drainage or sinus tenderness. Lips, mucosa, and tongue normal. No Thrush Neck:  Supple, symmetrical, no adenopathy, thyroid: non tender no carotid bruit and no JVD.  Lungs: Bilateral air entry Heart: S1-S2 abdomen: Soft, non-tender,not distended. Bowel sounds normal. No masses Extremities: atraumatic, no cyanosis.  Edema feet Right femoral dialysis catheter Right arm PICC line Skin: No rashes or lesions. Or bruising Lymph: Cervical, supraclavicular normal. Neurologic: Weakness of lower extremity.  Lab Results Recent Labs    12/15/20 0523 12/16/20 0516  WBC 6.3 7.6  HGB 7.6* 6.7*  HCT 22.1* 20.2*  NA 136 137  K 4.2 4.0  CL 102 100  CO2 30 30  BUN 40* 43*  CREATININE 4.43* 4.99*     Infectious disease work up 11/19/2020 blood culture no growth 11/20/2020 CSF culture no growth HSV PCR in CSF nonreactive VDRL nonreactive Crypto antigen nonreactive 11/12/2020 meningitis encephalitis panel nonreactive. 11/27/2020 urine culture Enterobacter treated  11/27/2020 blood culture no growth 12/06/20 BC- NG 12/14/20 BC-NG UA wbc 21-50 RBC > 50   Ultrasound kidney- no hydro Intraluminal cystic focus posterior RIGHT bladder, favor representing a  small RIGHT ureterocele.   2d echo-No veg     Assessment/Plan:  Patient presented with encephalopathy and pancytopenia on 11/19/2020.  Had fever on admission.  Lumbar puncture revealed neutrophilic pleocytosis but the cell count was only 50.  There was also increased protein in CSF.  Meningitis encephalitis PCR panel was negative and infection was ruled out.  Cytology was negative. Encephalopathy resolved.  She was also given thiamine high-dose.  She was noted to have pancreatitis incidental finding on CT.  She did not have much of abdominal pain.  Intermittent fevers.  Initially on presentation- then 8/6-8/12 - received 3 doses of 1 gram soulmedrol  8/13-8/18- no fever Fever again from 8/19>> Received 1 dose of rituximab on 12/12/20    She has been worked up for fever of unknown origin.Marland Kitchen  MRI of the brain and spine did not show any residual infection Blood cultures  from 11/27/20, 12/06/20 and 12/14/20  negative She was treated for Enterobacter aerogenes in the urine.  she did not have any pyelonephritis.  Cortisol and TSH was normal.  She was also given a trial of doxycycline for 3 days for any tickborne illness with no improvement. Likely fever from the underlying autoimmune process- had resolved with steroids and now back again No antibiotics since 12/04/20 Will observe closely off antibiotics as she is stable. Recommend CXR   AKI .  Initially thought to be contrast-induced.  That the work-up revealed very low C3-C4, ANA being positive, and positive for p-ANCA, mildly elevated dsDNA and Smith antibody.. Vasculitis VS acute lupus Received 1 gram soumedrol X 3 doses Started dialysis 1 dose of rituximab  Underwent renal biopsy today  Thrombocytopenia  History of CLL in remission. Because of FUO relapse was questioned and she underwent a bone marrow biopsy.  The reading was the bone marrow material was extremely limited for evaluation but appears to be trilineage hematopoiesis with  relative abundance of granulocytic cells.  There was no apparent morphological evidence of a lymphoproliferative process.  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  She needed surgical intervention and also completed 6 weeks of IV antibiotic.  There is no residual infection now.  Discussed the management with the patient  will discuss with care team tomorrow regarding reinitiation of steroids.

## 2020-12-16 NOTE — Procedures (Signed)
Interventional Radiology Procedure Note  Date of Procedure: 12/16/2020  Procedure: US guided random renal biopsy   Findings:  1. Successful US guided random renal biopsy of the left renal lower pole 16ga x2 passes   Complications: No immediate complications noted.   Estimated Blood Loss: minimal  Follow-up and Recommendations: 1. Bedrest 6 hours    Albin Felling, MD  Vascular & Interventional Radiology  12/16/2020 9:38 AM

## 2020-12-16 NOTE — Progress Notes (Signed)
[place]  [phone] [phone]  ID: Tonya Myers OB: [birth year]  MR#: [id number]  JGG#:[id number]  Patient Care Team: [name], MD as PCP - General (Family Medicine)  CHIEF COMPLAINT: Pancytopenia, acute renal failure, possible vasculitis/lupus.  INTERVAL HISTORY: Patient seen and evaluated in dialysis.  Alert, remains weak and fatigued with a poor memory.  Rituxan last Friday, results from kidney biopsy earlier today are pending.    REVIEW OF SYSTEMS:   Review of Systems  Constitutional:  Positive for malaise/fatigue. Negative for fever and weight loss.  Respiratory: Negative.  Negative for cough, hemoptysis and shortness of breath.   Cardiovascular: Negative.  Negative for chest pain and leg swelling.  Gastrointestinal: Negative.  Negative for abdominal pain.  Genitourinary: Negative.  Negative for dysuria.  Musculoskeletal: Negative.  Negative for back pain.  Skin: Negative.  Negative for rash.  Neurological:  Positive for weakness. Negative for dizziness, focal weakness and headaches.  Endo/Heme/Allergies:  Does not bruise/bleed easily.  Psychiatric/Behavioral:  Positive for memory loss. The patient is not nervous/anxious.     PAST MEDICAL HISTORY: Past Medical History:  Diagnosis Date   Acute hemorrhoid 03/11/2015   Anxiety    Arthritis    Asthma    Cancer ([place])    Chronic back pain    CLL (chronic lymphocytic leukemia) ([place])    Depression    Diabetes mellitus without complication ([place])    Genital herpes    type 2   Hypertension    Hypothyroidism    Vertigo     PAST SURGICAL HISTORY: Past Surgical History:  Procedure Laterality Date   ABDOMINAL HYSTERECTOMY     BILATERAL SALPINGOOPHORECTOMY  2009   BREAST BIOPSY Right 05/17/2016   FIBROADENOMATOUS CHANGE AND SCLEROSING ADENOSIS WITH   COLONOSCOPY WITH PROPOFOL N/A 06/16/2015   Procedure: COLONOSCOPY WITH PROPOFOL;  Surgeon: [name], MD;   Location: [facility] ENDOSCOPY;  Service: Endoscopy;  Laterality: N/A;   LAPAROSCOPIC SUPRACERVICAL HYSTERECTOMY  2009   due to [place] N/A 12/10/2020   Procedure: TEMPORARY DIALYSIS CATHETER;  Surgeon: Katha Cabal, MD;  Location: [facility];  Service: Cardiovascular;  Laterality: N/A;   THORACIC LAMINECTOMY FOR EPIDURAL ABSCESS Bilateral 06/17/2020   Procedure: THORACIC LAMINECTOMY FOR EPIDURAL ABSCESS;  Surgeon: Deetta Perla, MD;  Location: [facility] ORS;  Service: Neurosurgery;  Laterality: Bilateral;    FAMILY HISTORY: Family History  Problem Relation Age of Onset   Cancer Paternal Aunt    Breast cancer Maternal Aunt 17    ADVANCED DIRECTIVES (Y/N):  @ADVDIR @  HEALTH MAINTENANCE: Social History   Tobacco Use   Smoking status: Never   Smokeless tobacco: Never  Vaping Use   Vaping Use: Never used  Substance Use Topics   Alcohol use: Yes    Alcohol/week: 1.0 standard drink    Types: 1 Cans of beer per week   Drug use: No     Colonoscopy:  PAP:  Bone density:  Lipid panel:  Allergies  Allergen Reactions   Amoxapine    Penicillins     Current Facility-Administered Medications  Medication Dose Route Frequency Provider Last Rate Last Admin   acetaminophen (TYLENOL) tablet 650 mg  650 mg Oral Q6H PRN Cox, Amy N, DO   650 mg at 12/15/20 1146   Or   acetaminophen (TYLENOL) suppository 650 mg  650 mg Rectal Q6H PRN Cox, Amy N, DO       albuterol (PROVENTIL) (  2.5 MG/3ML) 0.083% nebulizer solution 3 mL  3 mL Inhalation Q6H PRN Cox, Amy N, DO       amLODipine (NORVASC) tablet 5 mg  5 mg Oral Daily Priscella Mann, Sudheer B, MD   5 mg at 12/15/20 1141   Chlorhexidine Gluconate Cloth 2 % PADS 6 each  6 each Topical Daily Enzo Bi, MD   6 each at 12/16/20 0800   diltiazem (CARDIZEM CD) 24 hr capsule 120 mg  120 mg Oral Daily Cox, Amy N, DO   120 mg at 12/15/20 1141   feeding supplement (BOOST / RESOURCE BREEZE) liquid 1 Container  1  Container Oral TID BM Enzo Bi, MD   1 Container at 12/16/20 1310   haloperidol lactate (HALDOL) injection 2 mg  2 mg Intravenous Q15 min PRN Enzo Bi, MD       insulin aspart (novoLOG) injection 0-15 Units  0-15 Units Subcutaneous TID WC Athena Masse, MD   2 Units at 12/16/20 1309   insulin aspart (novoLOG) injection 0-5 Units  0-5 Units Subcutaneous QHS Athena Masse, MD   2 Units at 12/15/20 2138   levothyroxine (SYNTHROID) tablet 100 mcg  100 mcg Oral QAC breakfast Ralene Muskrat B, MD   100 mcg at 12/15/20 3299   loperamide (IMODIUM) capsule 2 mg  2 mg Oral PRN Ralene Muskrat B, MD   2 mg at 12/05/20 0949   magnesium oxide (MAG-OX) tablet 800 mg  800 mg Oral BID Oswald Hillock, RPH   800 mg at 12/15/20 2138   mirtazapine (REMERON) tablet 15 mg  15 mg Oral QHS Ralene Muskrat B, MD   15 mg at 12/15/20 2138   multivitamin with minerals tablet 1 tablet  1 tablet Oral Daily Ralene Muskrat B, MD   1 tablet at 12/15/20 1141   ondansetron (ZOFRAN) tablet 4 mg  4 mg Oral Q6H PRN Cox, Amy N, DO       Or   ondansetron (ZOFRAN) injection 4 mg  4 mg Intravenous Q6H PRN Cox, Amy N, DO   4 mg at 11/23/20 0751   oxyCODONE (Oxy IR/ROXICODONE) immediate release tablet 5 mg  5 mg Oral Q4H PRN Dwyane Dee, MD       pantoprazole (PROTONIX) EC tablet 40 mg  40 mg Oral Daily Cox, Amy N, DO   40 mg at 12/16/20 1309   pneumococcal 13-valent conjugate vaccine (PREVNAR 13) injection 0.5 mL  0.5 mL Intramuscular Tomorrow-1000 Ravishankar, Jayashree, MD       polyethylene glycol (MIRALAX / GLYCOLAX) packet 34 g  34 g Oral BID PRN Enzo Bi, MD   34 g at 12/13/20 0814   psyllium (HYDROCIL/METAMUCIL) 1 packet  1 packet Oral Daily Ralene Muskrat B, MD   1 packet at 12/10/20 1042   rosuvastatin (CRESTOR) tablet 10 mg  10 mg Oral QHS Cox, Amy N, DO   10 mg at 12/15/20 2138   sodium bicarbonate tablet 650 mg  650 mg Oral TID Liana Gerold, MD   650 mg at 12/15/20 2138   sodium chloride flush  (NS) 0.9 % injection 10-40 mL  10-40 mL Intracatheter Q12H Enzo Bi, MD   10 mL at 12/16/20 0755   sodium chloride flush (NS) 0.9 % injection 10-40 mL  10-40 mL Intracatheter PRN Enzo Bi, MD   10 mL at 12/04/20 2128   thiamine tablet 100 mg  100 mg Oral Daily Oswald Hillock, RPH   100 mg at 12/15/20 1141  OBJECTIVE: Vitals:   12/16/20 1545 12/16/20 1556  BP: 134/79   Pulse: 81   Resp: (!) 38   Temp:  99.2 F (37.3 C)  SpO2:       Body mass index is 28.23 kg/m.    ECOG FS:2 - Symptomatic, <50% confined to bed  General: Well-developed, well-nourished, no acute distress. Eyes: Pink conjunctiva, anicteric sclera. HEENT: Normocephalic, moist mucous membranes. Lungs: No audible wheezing or coughing. Heart: Regular rate and rhythm. Abdomen: Soft, nontender, no obvious distention. Musculoskeletal: No edema, cyanosis, or clubbing. Neuro: Alert. Cranial nerves grossly intact. Skin: No rashes or petechiae noted. Psych: Normal affect.    LAB RESULTS:  Lab Results  Component Value Date   NA 137 12/16/2020   K 4.0 12/16/2020   CL 100 12/16/2020   CO2 30 12/16/2020   GLUCOSE 119 (H) 12/16/2020   BUN 43 (H) 12/16/2020   CREATININE 4.99 (H) 12/16/2020   CALCIUM 7.4 (L) 12/16/2020   PROT 4.6 (L) 12/16/2020   ALBUMIN 2.0 (L) 12/16/2020   AST 53 (H) 12/16/2020   ALT 15 12/16/2020   ALKPHOS 78 12/16/2020   BILITOT 0.7 12/16/2020   GFRNONAA 10 (L) 12/16/2020   GFRAA >60 01/17/2020    Lab Results  Component Value Date   WBC 7.6 12/16/2020   NEUTROABS 5.5 12/16/2020   HGB 6.7 (L) 12/16/2020   HCT 20.2 (L) 12/16/2020   MCV 84.2 12/16/2020   PLT 57 (L) 12/16/2020     STUDIES: DG Chest 1 View  Result Date: 12/04/2020 CLINICAL DATA:  Cough EXAM: CHEST 1 VIEW COMPARISON:  11/27/2020 FINDINGS: Few faint residual patchy opacities are present in the right mid lung. There is persistent basilar atelectasis as well. Asymmetric elevation of the right hemidiaphragm similar to  prior. New left effusion. No right effusion. No pneumothorax. Stable cardiomediastinal contours. Right upper extremity PICC tip terminates at the superior cavoatrial junction. No acute osseous or soft tissue abnormality of the chest wall IMPRESSION: Few faint residual patchy opacities present in the right mid lung, possibly infectious or inflammatory as seen on CT and prior radiograph. New trace left effusion. Right upper extremity PICC terminates at the superior cavoatrial junction. Electronically Signed   By: Lovena Le M.D.   On: 12/04/2020 19:30   CT Head Wo Contrast  Result Date: 11/19/2020 CLINICAL DATA:  Mental status changes of unknown cause. EXAM: CT HEAD WITHOUT CONTRAST TECHNIQUE: Contiguous axial images were obtained from the base of the skull through the vertex without intravenous contrast. COMPARISON:  10/02/2020 FINDINGS: Brain: The brain shows a normal appearance without evidence of malformation, atrophy, old or acute small or large vessel infarction, mass lesion, hemorrhage, hydrocephalus or extra-axial collection. Vascular: No hyperdense vessel. No evidence of atherosclerotic calcification. Skull: Normal.  No traumatic finding.  No focal bone lesion. Sinuses/Orbits: Sinuses are clear. Orbits appear normal. Mastoids are clear. Other: None significant IMPRESSION: Normal head CT. Electronically Signed   By: Nelson Chimes M.D.   On: 11/19/2020 16:19   CT HEAD W & WO CONTRAST  Result Date: 11/21/2020 CLINICAL DATA:  Metastatic disease evaluation. Additional provided: Patient with history of recent pneumococcal meningitis and bacteremia, diabetes mellitus presenting with altered mental status. EXAM: CT HEAD WITHOUT AND WITH CONTRAST TECHNIQUE: Contiguous axial images were obtained from the base of the skull through the vertex without and with intravenous contrast CONTRAST:  16m OMNIPAQUE IOHEXOL 350 MG/ML SOLN COMPARISON:  Prior head CT examinations 11/19/2020 and earlier. FINDINGS: Brain:  Cerebral volume is normal for  age. There is no acute intracranial hemorrhage. No demarcated cortical infarct. No extra-axial fluid collection. No evidence of an intracranial mass. No midline shift. No pathologic intracranial enhancement is identified. Vascular: Atherosclerotic calcifications. Skull: Normal. Negative for fracture or focal suspicious osseous lesion. Sinuses/Orbits: Visualized orbits show no acute finding. Minimal frothy secretions within the left sphenoid sinus. Small right sphenoid sinus mucous retention cyst. Minimal partial opacification of the posterior right ethmoid air cells. IMPRESSION: No evidence of acute intracranial abnormality. No CT evidence of intracranial metastatic disease. Mild paranasal sinus disease at the imaged levels, as described. Electronically Signed   By: [name] DO   On: 11/21/2020 15:42   MR BRAIN W WO CONTRAST  Result Date: 11/28/2020 CLINICAL DATA:  Mental status change, unknown cause; encephalopathy of unknown cause; recurrent fever. EXAM: MRI HEAD WITHOUT AND WITH CONTRAST TECHNIQUE: Multiplanar, multiecho pulse sequences of the brain and surrounding structures were obtained without and with intravenous contrast. CONTRAST:  71m GADAVIST GADOBUTROL 1 MMOL/ML IV SOLN COMPARISON:  Head CT November 21, 2020 FINDINGS: Brain: Bilateral T2 hyperintense subdural fluid collections measuring up to 5 mm on the right and 6 mm on the left with mild mass effect on the adjacent brain parenchyma without midline shift. Postcontrast images are severely degraded by motion. Mild diffuse pachymeningeal contrast enhancement seen. The brain parenchyma has normal morphology and signal characteristics. No acute infarction, hemorrhage, hydrocephalus, or mass lesion. Vascular: Normal flow voids. Skull and upper cervical spine: Normal marrow signal. Sinuses/Orbits: Negative. Other: None. IMPRESSION: Interval development of small bilateral subdural fluid collections, may be related to recent  lumbar puncture. Electronically Signed   By: KPedro EarlsM.D.   On: 11/28/2020 14:54   MR CERVICAL SPINE W WO CONTRAST  Result Date: 11/28/2020 CLINICAL DATA:  History of epidural abscess and paraspinal abscess in the setting of recurrent fever and altered mental status. EXAM: MRI CERVICAL, THORACIC AND LUMBAR SPINE WITHOUT AND WITH CONTRAST TECHNIQUE: Multiplanar and multiecho pulse sequences of the cervical spine, to include the craniocervical junction and cervicothoracic junction, and thoracic and lumbar spine, were obtained without and with intravenous contrast. CONTRAST:  843mGADAVIST GADOBUTROL 1 MMOL/ML IV SOLN COMPARISON:  MRI of the cervical, thoracic and lumbar spine June 17, 2020. FINDINGS: The study is partially degraded by motion. Postcontrast axial images are essentially nondiagnostic. MRI CERVICAL SPINE FINDINGS Alignment: Physiologic. Vertebrae: No fracture, evidence of discitis, or bone lesion. Cord: Increased T2 signal within the cord at the C3 level. Posterior Fossa, vertebral arteries, paraspinal tissues: Negative. Disc levels: C2-3: Posterior disc protrusion causing mild mass effect on the cord without significant spinal canal stenosis. Facet degenerative changes resulting mild left neural foraminal narrowing. C3-4: Left posterolateral disc protrusion causing moderate to severe spinal canal stenosis with mass effect on the cord. Increased T2 signal is seen within the cord immediately above the level of compression, likely edema. Uncovertebral and facet degenerative changes resulting in moderate right and severe left neural foraminal narrowing. C4-5: Posterior disc protrusion asymmetric to the left causing mass effect on the cord and resulting in moderate spinal canal stenosis. Uncovertebral and facet degenerative changes resulting in severe bilateral neural foraminal narrowing. C5-6: Posterior disc osteophyte complex causing mass effect on the cord and resulting in  moderate spinal canal stenosis. Uncovertebral and facet degenerative changes resulting severe bilateral neural foraminal narrowing. C6-7: Left posterolateral disc protrusion causing indentation of the thecal sac and resulting mild spinal canal stenosis. Uncovertebral and facet degenerative changes resulting in mild bilateral neural  foraminal narrowing. C7-T1: Left posterolateral disc protrusion causing indentation on the thecal sac and resulting in mild left neural foraminal narrowing. No significant spinal canal stenosis. MRI THORACIC SPINE FINDINGS Alignment:  Physiologic. Vertebrae: No fracture, evidence of discitis, or bone lesion. Postsurgical changes from T3-4, T5-7 and T9-10 laminectomies. Interval resolution of the large posterior epidural fluid collection. Cord: Central increased T2 signal within the cord at T6-7, T7-8 and from the T8-9 level to the conus medullaris. Paraspinal and other soft tissues: Small bilateral pleural effusion. A 1.5 cm T2 hyperintense hepatic lesion. Disc levels: Tiny posterior disc protrusions at T3-4, T5-6, T6-7 and T7-8 causing minimal indentation on the thecal sac. No significant spinal canal or neural foraminal stenosis at any thoracic level. MRI LUMBAR SPINE FINDINGS Segmentation:  Standard. Alignment:  Physiologic. Vertebrae: No fracture, evidence of discitis, or bone lesion. Endplate degenerative changes at L5-S1. Conus medullaris and cauda equina: Conus extends to the L1-2 level. Increased T2 signal within the conus medullaris, as described above. 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 Paraspinal and other soft tissues: Negative. Disc levels: T12-L1: No spinal canal or neural foraminal stenosis. L1-2: No spinal canal or neural foraminal stenosis. L2-3: Shallow disc bulge and moderate facet degenerative changes without significant spinal canal or neural foraminal stenosis. L3-4: Moderate facet degenerative  changes. No spinal canal or neural foraminal stenosis. L4-5: Mild loss of disc height, disc bulge and moderate facet degenerative changes without significant spinal canal or neural foraminal stenosis. L5-S1: Loss of disc height, disc bulge with superimposed left central/subarticular disc protrusion and moderate facet degenerative changes. Findings result in narrowing of the left subarticular zone with displacement of the traversing left S1 nerve root. No significant neural foraminal narrowing. IMPRESSION: 1. The study is degraded by motion, particularly the postcontrast images. 2. Interval resolution of the large posterior thoracic epidural fluid collection with postsurgical changes from multilevel laminectomy noted in the thoracic spine. No new fluid collection identified. 3. Increased T2 signal within the cord centrally at T6-7, T7-8 and from the T8-9 level to the conus medullaris without cord expansion. This may represent post ischemic changes related to now resolved cord compression. 4. 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 5. 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 6. Multilevel high-grade neural foraminal narrowing at the cervical spine. 7. Mild degenerative changes of the thoracic spine without spinal canal or neural foraminal stenosis. 8. Degenerative disc disease at L5-S1 with narrowing of the left subarticular zone and likely impingement of the traversing left S1 nerve root. Electronically Signed   By: Pedro Earls M.D.   On: 11/28/2020 15:41   MR THORACIC SPINE W WO CONTRAST  Result Date: 11/28/2020 CLINICAL DATA:  History of epidural abscess and paraspinal abscess in the setting of recurrent fever and altered mental status. EXAM: MRI CERVICAL, THORACIC AND LUMBAR SPINE WITHOUT AND WITH CONTRAST TECHNIQUE:  Multiplanar and multiecho pulse sequences of the cervical spine, to include the craniocervical junction and cervicothoracic junction, and thoracic and lumbar spine, were obtained without and with intravenous contrast. CONTRAST:  63m GADAVIST GADOBUTROL 1 MMOL/ML IV SOLN COMPARISON:  MRI of the cervical, thoracic and lumbar spine June 17, 2020. FINDINGS: The study is partially degraded by motion. Postcontrast axial images are essentially nondiagnostic. MRI CERVICAL SPINE FINDINGS Alignment:  Physiologic. Vertebrae: No fracture, evidence of discitis, or bone lesion. Cord: Increased T2 signal within the cord at the C3 level. Posterior Fossa, vertebral arteries, paraspinal tissues: Negative. Disc levels: C2-3: Posterior disc protrusion causing mild mass effect on the cord without significant spinal canal stenosis. Facet degenerative changes resulting mild left neural foraminal narrowing. C3-4: Left posterolateral disc protrusion causing moderate to severe spinal canal stenosis with mass effect on the cord. Increased T2 signal is seen within the cord immediately above the level of compression, likely edema. Uncovertebral and facet degenerative changes resulting in moderate right and severe left neural foraminal narrowing. C4-5: Posterior disc protrusion asymmetric to the left causing mass effect on the cord and resulting in moderate spinal canal stenosis. Uncovertebral and facet degenerative changes resulting in severe bilateral neural foraminal narrowing. C5-6: Posterior disc osteophyte complex causing mass effect on the cord and resulting in moderate spinal canal stenosis. Uncovertebral and facet degenerative changes resulting severe bilateral neural foraminal narrowing. C6-7: Left posterolateral disc protrusion causing indentation of the thecal sac and resulting mild spinal canal stenosis. Uncovertebral and facet degenerative changes resulting in mild bilateral neural foraminal narrowing. C7-T1: Left posterolateral  disc protrusion causing indentation on the thecal sac and resulting in mild left neural foraminal narrowing. No significant spinal canal stenosis. MRI THORACIC SPINE FINDINGS Alignment:  Physiologic. Vertebrae: No fracture, evidence of discitis, or bone lesion. Postsurgical changes from T3-4, T5-7 and T9-10 laminectomies. Interval resolution of the large posterior epidural fluid collection. Cord: Central increased T2 signal within the cord at T6-7, T7-8 and from the T8-9 level to the conus medullaris. Paraspinal and other soft tissues: Small bilateral pleural effusion. A 1.5 cm T2 hyperintense hepatic lesion. Disc levels: Tiny posterior disc protrusions at T3-4, T5-6, T6-7 and T7-8 causing minimal indentation on the thecal sac. No significant spinal canal or neural foraminal stenosis at any thoracic level. MRI LUMBAR SPINE FINDINGS Segmentation:  Standard. Alignment:  Physiologic. Vertebrae: No fracture, evidence of discitis, or bone lesion. Endplate degenerative changes at L5-S1. Conus medullaris and cauda equina: Conus extends to the L1-2 level. Increased T2 signal within the conus medullaris, as described above. 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 Paraspinal and other soft tissues: Negative. Disc levels: T12-L1: No spinal canal or neural foraminal stenosis. L1-2: No spinal canal or neural foraminal stenosis. L2-3: Shallow disc bulge and moderate facet degenerative changes without significant spinal canal or neural foraminal stenosis. L3-4: Moderate facet degenerative changes. No spinal canal or neural foraminal stenosis. L4-5: Mild loss of disc height, disc bulge and moderate facet degenerative changes without significant spinal canal or neural foraminal stenosis. L5-S1: Loss of disc height, disc bulge with superimposed left central/subarticular disc protrusion and moderate facet degenerative changes. Findings result in narrowing of the left  subarticular zone with displacement of the traversing left S1 nerve root. No significant neural foraminal narrowing. IMPRESSION: 1. The study is degraded by motion, particularly the postcontrast images. 2. Interval resolution of the large posterior thoracic epidural fluid collection with postsurgical changes from multilevel laminectomy noted in the thoracic spine. No new fluid collection identified. 3. Increased T2 signal within the cord centrally at T6-7, T7-8 and from the T8-9 level to the conus medullaris without cord expansion. This may represent post ischemic changes related to now resolved cord compression. 4. 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 5. Advanced degenerative changes of the cervical spine,  with moderate to severe spinal canal stenosis at C3-4 and moderate at C4-5 and C5-6. 6. Multilevel high-grade neural foraminal narrowing at the cervical spine. 7. Mild degenerative changes of the thoracic spine without spinal canal or neural foraminal stenosis. 8. Degenerative disc disease at L5-S1 with narrowing of the left subarticular zone and likely impingement of the traversing left S1 nerve root. Electronically Signed   By: Pedro Earls M.D.   On: 11/28/2020 15:41   MR Lumbar Spine W Wo Contrast  Result Date: 11/28/2020 CLINICAL DATA:  History of epidural abscess and paraspinal abscess in the setting of recurrent fever and altered mental status. EXAM: MRI CERVICAL, THORACIC AND LUMBAR SPINE WITHOUT AND WITH CONTRAST TECHNIQUE: Multiplanar and multiecho pulse sequences of the cervical spine, to include the craniocervical junction and cervicothoracic junction, and thoracic and lumbar spine, were obtained without and with intravenous contrast. CONTRAST:  19m GADAVIST GADOBUTROL 1 MMOL/ML IV SOLN COMPARISON:  MRI of the cervical, thoracic and lumbar spine June 17, 2020. FINDINGS: The study is partially degraded  by motion. Postcontrast axial images are essentially nondiagnostic. MRI CERVICAL SPINE FINDINGS Alignment: Physiologic. Vertebrae: No fracture, evidence of discitis, or bone lesion. Cord: Increased T2 signal within the cord at the C3 level. Posterior Fossa, vertebral arteries, paraspinal tissues: Negative. Disc levels: C2-3: Posterior disc protrusion causing mild mass effect on the cord without significant spinal canal stenosis. Facet degenerative changes resulting mild left neural foraminal narrowing. C3-4: Left posterolateral disc protrusion causing moderate to severe spinal canal stenosis with mass effect on the cord. Increased T2 signal is seen within the cord immediately above the level of compression, likely edema. Uncovertebral and facet degenerative changes resulting in moderate right and severe left neural foraminal narrowing. C4-5: Posterior disc protrusion asymmetric to the left causing mass effect on the cord and resulting in moderate spinal canal stenosis. Uncovertebral and facet degenerative changes resulting in severe bilateral neural foraminal narrowing. C5-6: Posterior disc osteophyte complex causing mass effect on the cord and resulting in moderate spinal canal stenosis. Uncovertebral and facet degenerative changes resulting severe bilateral neural foraminal narrowing. C6-7: Left posterolateral disc protrusion causing indentation of the thecal sac and resulting mild spinal canal stenosis. Uncovertebral and facet degenerative changes resulting in mild bilateral neural foraminal narrowing. C7-T1: Left posterolateral disc protrusion causing indentation on the thecal sac and resulting in mild left neural foraminal narrowing. No significant spinal canal stenosis. MRI THORACIC SPINE FINDINGS Alignment:  Physiologic. Vertebrae: No fracture, evidence of discitis, or bone lesion. Postsurgical changes from T3-4, T5-7 and T9-10 laminectomies. Interval resolution of the large posterior epidural fluid  collection. Cord: Central increased T2 signal within the cord at T6-7, T7-8 and from the T8-9 level to the conus medullaris. Paraspinal and other soft tissues: Small bilateral pleural effusion. A 1.5 cm T2 hyperintense hepatic lesion. Disc levels: Tiny posterior disc protrusions at T3-4, T5-6, T6-7 and T7-8 causing minimal indentation on the thecal sac. No significant spinal canal or neural foraminal stenosis at any thoracic level. MRI LUMBAR SPINE FINDINGS Segmentation:  Standard. Alignment:  Physiologic. Vertebrae: No fracture, evidence of discitis, or bone lesion. Endplate degenerative changes at L5-S1. Conus medullaris and cauda equina: Conus extends to the L1-2 level. Increased T2 signal within the conus medullaris, as described above. 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 Paraspinal and other soft tissues: Negative. Disc levels: T12-L1: No spinal canal or neural foraminal stenosis. L1-2: No spinal canal or neural foraminal stenosis. L2-3:  Shallow disc bulge and moderate facet degenerative changes without significant spinal canal or neural foraminal stenosis. L3-4: Moderate facet degenerative changes. No spinal canal or neural foraminal stenosis. L4-5: Mild loss of disc height, disc bulge and moderate facet degenerative changes without significant spinal canal or neural foraminal stenosis. L5-S1: Loss of disc height, disc bulge with superimposed left central/subarticular disc protrusion and moderate facet degenerative changes. Findings result in narrowing of the left subarticular zone with displacement of the traversing left S1 nerve root. No significant neural foraminal narrowing. IMPRESSION: 1. The study is degraded by motion, particularly the postcontrast images. 2. Interval resolution of the large posterior thoracic epidural fluid collection with postsurgical changes from multilevel laminectomy noted in the thoracic spine. No new fluid  collection identified. 3. Increased T2 signal within the cord centrally at T6-7, T7-8 and from the T8-9 level to the conus medullaris without cord expansion. This may represent post ischemic changes related to now resolved cord compression. 4. 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 5. 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 6. Multilevel high-grade neural foraminal narrowing at the cervical spine. 7. Mild degenerative changes of the thoracic spine without spinal canal or neural foraminal stenosis. 8. Degenerative disc disease at L5-S1 with narrowing of the left subarticular zone and likely impingement of the traversing left S1 nerve root. Electronically Signed   By: Pedro Earls M.D.   On: 11/28/2020 15:41   US RENAL  Result Date: 12/01/2020 CLINICAL DATA:  Acute kidney injury, history CLL, diabetes mellitus, hypertension EXAM: RENAL / URINARY TRACT ULTRASOUND COMPLETE COMPARISON:  CT abdomen and pelvis 11/21/2020 FINDINGS: Right Kidney: Renal measurements: 11.4 x 5.8 x 6.0 cm = volume: 206 mL. Normal cortical thickness. Increased cortical echogenicity. No mass, hydronephrosis, or shadowing calcification. Left Kidney: Renal measurements: 13.1 x 7.3 x 6.1 cm = volume: 3 L4 mL. Normal cortical thickness. Increased cortical echogenicity. Tiny cyst at mid kidney 9 x 13 x 13 mm, simple features. No additional mass, hydronephrosis, or shadowing calcification. Bladder: Partially distended. Cystic intraluminal focus at the posterior wall RIGHT of midline question RIGHT ureterocele approximately 16 x 6 x 9 mm in size. Other: N/A IMPRESSION: Medical renal disease changes of both kidneys. Intraluminal cystic focus posterior RIGHT bladder, favor representing a small RIGHT ureterocele. Electronically Signed   By: Lavonia Dana M.D.   On:  12/01/2020 16:26   CT CHEST ABDOMEN PELVIS W CONTRAST  Result Date: 11/21/2020 CLINICAL DATA:  Altered mental status. Recent history of pneumococcal meningitis and bacteremia. Coffee ground emesis. EXAM: CT CHEST, ABDOMEN, AND PELVIS WITH CONTRAST TECHNIQUE: Multidetector CT imaging of the chest, abdomen and pelvis was performed following the standard protocol during bolus administration of intravenous contrast. CONTRAST:  100 mL OMNIPAQUE IOHEXOL 350 MG/ML SOLN COMPARISON:  CT abdomen and pelvis 05/23/2020. FINDINGS: CT CHEST FINDINGS Cardiovascular: There is mild cardiomegaly and a small pericardial effusion. No atherosclerosis is seen. Mediastinum/Nodes: Innumerable axillary and subpectoral lymph nodes are new since the prior chest CT. Index node in the right axilla measuring 1 cm short axis dimension is seen on image 21 of series 3. A left subpectoral node measuring 1.3 cm is seen on image 24. Small mediastinal lymph nodes include a precarinal node on image 27 measuring 1 cm. The esophagus and thyroid are negative. Lungs/Pleura: There is some dependent atelectasis.  A 0.7 x 0.5 cm right upper lobe nodule on image 72 of series 6 measure approximately 0.7 x 0.4 cm on the prior exam. There is extensive micronodularity in the right upper lobe. Musculoskeletal: No acute or focal abnormality CT ABDOMEN PELVIS FINDINGS Hepatobiliary: No focal liver abnormality is seen. No gallstones, gallbladder wall thickening, or biliary dilatation. The liver is diffusely low attenuating consistent with marked fatty infiltration. Pancreas: Vanessa Start is present about the pancreas throughout is consistent with acute pancreatitis. No focal fluid collection. The pancreas enhances homogeneously. Spleen: Normal in size without focal abnormality. Adrenals/Urinary Tract: The adrenal glands appear normal. A single small cyst is seen in the left kidney. On delayed imaging, there are areas of decreased attenuation in both kidneys which may  be artifactual. No hydronephrosis. No ureteral or urinary bladder stone. Stomach/Bowel: Stomach is within normal limits. Appendix appears normal. No evidence of bowel wall thickening, distention, or inflammatory changes. Vascular/Lymphatic: No significant vascular findings are present. No enlarged abdominal or pelvic lymph nodes. Reproductive: Status post hysterectomy. No adnexal masses. Other: None. Musculoskeletal: No acute or focal bony abnormality. IMPRESSION: The examination is positive for findings consistent with acute pancreatitis without pseudocyst formation or pancreatic necrosis. Micronodularity in the right upper lobe is worrisome for bronchopneumonia. 0.7 x 0.5 cm right upper lobe nodule has mildly increased in size since 2017. Non-contrast chest CT at 6-12 months is recommended. If the nodule is stable at time of repeat CT, then future CT at 18-24 months (from today's scan) is considered optional for low-risk patients, but is recommended for high-risk patients. This recommendation follows the consensus statement: Guidelines for Management of Incidental Pulmonary Nodules Detected on CT Images: From the Fleischner Society 2017; Radiology 2017; 284:228-243. Multiple axillary and subpectoral lymph nodes are abnormal in number and likely related to this patient's history of CLL. Cardiomegaly and small pericardial effusion. Marked fatty infiltration of the liver. Decreased cortical attenuation the kidneys is likely artifactual. Correlation with urinalysis could be used to exclude pyelonephritis. Electronically Signed   By: Inge Rise M.D.   On: 11/21/2020 16:55   PERIPHERAL VASCULAR CATHETERIZATION  Result Date: 12/10/2020 See surgical note for result.  US Venous Img Lower Bilateral (DVT)  Result Date: 12/05/2020 CLINICAL DATA:  Fever unknown origin EXAM: BILATERAL LOWER EXTREMITY VENOUS DOPPLER ULTRASOUND TECHNIQUE: Gray-scale sonography with compression, as well as color and duplex  ultrasound, were performed to evaluate the deep venous system(s) from the level of the common femoral vein through the popliteal and proximal calf veins. COMPARISON:  None. FINDINGS: VENOUS On the left, normal compressibility of the common femoral, superficial femoral, and popliteal veins, as well as the visualized calf veins. Visualized portions of profunda femoral vein and great saphenous vein unremarkable. No filling defects to suggest DVT on grayscale or color Doppler imaging. Doppler waveforms show normal direction of venous flow, normal respiratory phasicity and response to augmentation. On the right, incomplete compressibility of the proximal popliteal vein with eccentric mural thickening containing linear echogenic regions. There is continued patency of the lumen with flow seen on color Doppler. Common femoral, femoral, and visualized calf veins are unremarkable. OTHER None. Limitations: none IMPRESSION: 1. Negative for acute DVT. 2. 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 Electronically Signed   By: Lucrezia Europe M.D.   On: 12/05/2020 07:25   US Venous Img Upper Bilat (DVT)  Result Date: 12/05/2020 CLINICAL DATA:  Fever of unknown origin, diabetes, numbness EXAM: BILATERAL UPPER EXTREMITY VENOUS DOPPLER ULTRASOUND TECHNIQUE:  Gray-scale sonography with graded compression, as well as color Doppler and duplex ultrasound were performed to evaluate the bilateral upper extremity deep venous systems from the level of the subclavian vein and including the jugular, axillary, basilic, radial, ulnar and upper cephalic vein. Spectral Doppler was utilized to evaluate flow at rest and with distal augmentation maneuvers. COMPARISON:  None. FINDINGS: RIGHT UPPER EXTREMITY Internal Jugular Vein: No evidence of thrombus. Normal compressibility, respiratory phasicity and response to augmentation. Subclavian Vein: No evidence of thrombus. Normal compressibility,  respiratory phasicity and response to augmentation. Axillary Vein: No evidence of thrombus. Normal compressibility, respiratory phasicity and response to augmentation. Cephalic Vein: Not visualized Basilic Vein: No evidence of thrombus. Normal compressibility, respiratory phasicity and response to augmentation. Brachial Veins: No evidence of thrombus. Normal compressibility, respiratory phasicity and response to augmentation. Radial Veins: No evidence of thrombus. Normal compressibility, respiratory phasicity and response to augmentation. Ulnar Veins: No evidence of thrombus. Normal compressibility, respiratory phasicity and response to augmentation. Venous Reflux:  None. Other Findings:  None. LEFT UPPER EXTREMITY Internal Jugular Vein: No evidence of thrombus. Normal compressibility, respiratory phasicity and response to augmentation. Subclavian Vein: No evidence of thrombus. Normal compressibility, respiratory phasicity and response to augmentation. Axillary Vein: No evidence of thrombus. Normal compressibility, respiratory phasicity and response to augmentation. Cephalic Vein: Not visualized Basilic Vein: No evidence of thrombus. Normal compressibility, respiratory phasicity and response to augmentation. Brachial Veins: No evidence of thrombus. Normal compressibility, respiratory phasicity and response to augmentation. Radial Veins: No evidence of thrombus. Normal compressibility, respiratory phasicity and response to augmentation. Ulnar Veins: No evidence of thrombus. Normal compressibility, respiratory phasicity and response to augmentation. Venous Reflux:  None. Other Findings:  None. IMPRESSION: No evidence of DVT within either upper extremity. Electronically Signed   By: Lucrezia Europe M.D.   On: 12/05/2020 07:28   DG Chest Port 1 View  Result Date: 11/27/2020 CLINICAL DATA:  Fever and chest pain. EXAM: PORTABLE CHEST 1 VIEW COMPARISON:  And CT chest, abdomen and pelvis 11/21/2020. Single-view of the chest  10/01/2020. FINDINGS: Subtle micro nodules are seen in the right upper lobe. The left lung demonstrates minimal atelectasis in the base. Heart size is normal. No pneumothorax or pleural fluid. No acute or focal bony abnormality. IMPRESSION: Subtle focus of mild micronodularity in the right upper lobe compatible with residual infectious or inflammatory process as seen on prior CT. Recommend continued follow-up to clearing. Electronically Signed   By: Inge Rise M.D.   On: 11/27/2020 11:36   DG Chest Portable 1 View  Result Date: 11/19/2020 CLINICAL DATA:  Cough, fever EXAM: PORTABLE CHEST 1 VIEW COMPARISON:  10/01/2020 FINDINGS: Heart size is upper limits of normal. Low lung volumes. Mildly increased interstitial markings bilaterally. No lobar consolidation. No pleural effusion or pneumothorax. IMPRESSION: Low lung volumes with mildly increased interstitial markings bilaterally may reflect mild edema or atypical/viral infection. Electronically Signed   By: Davina Poke D.O.   On: 11/19/2020 16:22   EEG adult  Result Date: 11/25/2020 Lora Havens, MD     11/25/2020  5:08 PM Patient Name: Ceilidh Torregrossa MRN: [id number] Epilepsy Attending: Lora Havens Referring Physician/Provider: Dr Rosalin Hawking Date: 11/25/2020 Duration: 31.44 mins Patient history: 55yo F with ams. EEG to evaluate for seizure Level of alertness: Awake AEDs during EEG study: None Technical aspects: This EEG study was done with scalp electrodes positioned according to the 10-20 International system of electrode placement. Electrical activity was acquired at a sampling rate of 500Hz  and reviewed  with a high frequency filter of 70Hz  and a low frequency filter of 1Hz . EEG data were recorded continuously and digitally stored. Description: No posterior dominant rhythm was seen. EEG showed continuous generalized 3 to 6 Hz theta-delta slowing. Physiologic photic driving was not seen during photic stimulation.  Hyperventilation was not  performed.   ABNORMALITY - Continuous slow, generalized IMPRESSION: This study is suggestive of moderate diffuse encephalopathy, nonspecific etiology. No seizures or epileptiform discharges were seen throughout the recording. Priyanka [name]   CT BONE MARROW BIOPSY  Result Date: 12/04/2020 CLINICAL DATA:  Chronic lymphocytic leukemia EXAM: CT GUIDED DEEP ILIAC BONE ASPIRATION AND CORE BIOPSY TECHNIQUE: Patient was placed prone on the CT gantry and limited axial scans through the pelvis were obtained. Appropriate skin entry site was identified. Skin site was marked, prepped with chlorhexidine, draped in usual sterile fashion, and infiltrated locally with 1% lidocaine. Patient was given Versed 2 mg IV for anxiolysis during continuous cardiorespiratory monitoring by the radiology RN, during the procedure time of 11 minutes. Under CT fluoroscopic guidance an 11-gauge Cook trocar bone needle was advanced into the right iliac bone just lateral to the sacroiliac joint. Once needle tip position was confirmed, aspiration and 2 core samples were obtained, submitted to pathology for approval. Post procedure scans show no hematoma or fracture. Patient tolerated procedure well. COMPLICATIONS: COMPLICATIONS none IMPRESSION: 1. Technically successful CT guided right iliac bone core and aspiration biopsy. Electronically Signed   By: Lucrezia Europe M.D.   On: 12/04/2020 15:53   ECHOCARDIOGRAM COMPLETE  Result Date: 12/05/2020    ECHOCARDIOGRAM REPORT   Patient Name:   KIMBERLLY NORGARD Date of Exam: 12/05/2020 Medical Rec #:  [id number]       Height:       72.0 in Accession #:    [id number]      Weight:       208.1 lb Date of Birth:  12-Aug-1965       BSA:          2.167 m Patient Age:    24 years        BP:           136/71 mmHg Patient Gender: F               HR:           80 bpm. Exam Location:  [facility] Procedure: 2D Echo, Color Doppler and Cardiac Doppler Indications:     R50.9 Fever  History:         Patient has prior history of  Echocardiogram examinations, most                  recent 06/11/2020. Signs/Symptoms:Fever; Risk                  Factors:Hypertension and Diabetes. Asthma.  Sonographer:     Charmayne Sheer Referring Phys:  [id number] [name] Diagnosing Phys: Yolonda Kida MD  Sonographer Comments: Suboptimal apical window. IMPRESSIONS  1. Left ventricular ejection fraction, by estimation, is 60 to 65%. The left ventricle has normal function. The left ventricle has no regional wall motion abnormalities. Left ventricular diastolic parameters were normal.  2. Right ventricular systolic function is normal. The right ventricular size is normal. Mildly increased right ventricular wall thickness.  3. The mitral valve is normal in structure. No evidence of mitral valve regurgitation.  4. The aortic valve is normal in structure. Aortic valve regurgitation is not visualized. FINDINGS  Left Ventricle: Left  ventricular ejection fraction, by estimation, is 60 to 65%. The left ventricle has normal function. The left ventricle has no regional wall motion abnormalities. The left ventricular internal cavity size was normal in size. There is  borderline concentric left ventricular hypertrophy. Left ventricular diastolic parameters were normal. Right Ventricle: The right ventricular size is normal. Mildly increased right ventricular wall thickness. Right ventricular systolic function is normal. Left Atrium: Left atrial size was normal in size. Right Atrium: Right atrial size was normal in size. Pericardium: There is no evidence of pericardial effusion. Mitral Valve: The mitral valve is normal in structure. No evidence of mitral valve regurgitation. MV peak gradient, 2.6 mmHg. The mean mitral valve gradient is 1.0 mmHg. Tricuspid Valve: The tricuspid valve is normal in structure. Tricuspid valve regurgitation is not demonstrated. Aortic Valve: The aortic valve is normal in structure. Aortic valve regurgitation is not visualized. Aortic valve  mean gradient measures 7.0 mmHg. Aortic valve peak gradient measures 12.5 mmHg. Aortic valve area, by VTI measures 2.59 cm. Pulmonic Valve: The pulmonic valve was normal in structure. Pulmonic valve regurgitation is not visualized. Aorta: The ascending aorta was not well visualized. IAS/Shunts: No atrial level shunt detected by color flow Doppler.  LEFT VENTRICLE PLAX 2D LVIDd:         4.91 cm  Diastology LVIDs:         3.33 cm  LV e' medial:    6.85 cm/s LV PW:         0.94 cm  LV E/e' medial:  10.7 LV IVS:        0.77 cm  LV e' lateral:   7.62 cm/s LVOT diam:     2.10 cm  LV E/e' lateral: 9.6 LV SV:         73 LV SV Index:   34 LVOT Area:     3.46 cm  LEFT ATRIUM             Index LA diam:        3.50 cm 1.62 cm/m LA Vol (A2C):   42.9 ml 19.80 ml/m LA Vol (A4C):   56.8 ml 26.21 ml/m LA Biplane Vol: 51.6 ml 23.81 ml/m  AORTIC VALVE                    PULMONIC VALVE AV Area (Vmax):    2.29 cm     PV Vmax:       1.40 m/s AV Area (Vmean):   2.48 cm     PV Vmean:      92.300 cm/s AV Area (VTI):     2.59 cm     PV VTI:        0.187 m AV Vmax:           177.00 cm/s  PV Peak grad:  7.8 mmHg AV Vmean:          118.000 cm/s PV Mean grad:  4.0 mmHg AV VTI:            0.284 m AV Peak Grad:      12.5 mmHg AV Mean Grad:      7.0 mmHg LVOT Vmax:         117.00 cm/s LVOT Vmean:        84.600 cm/s LVOT VTI:          0.212 m LVOT/AV VTI ratio: 0.75  AORTA Ao Root diam: 2.80 cm MITRAL VALVE MV Area (PHT): 2.74 cm    SHUNTS MV Area  VTI:   3.16 cm    Systemic VTI:  0.21 m MV Peak grad:  2.6 mmHg    Systemic Diam: 2.10 cm MV Mean grad:  1.0 mmHg MV Vmax:       0.80 m/s MV Vmean:      56.2 cm/s MV Decel Time: 277 msec MV E velocity: 73.30 cm/s MV A velocity: 66.80 cm/s MV E/A ratio:  1.10 Yolonda Kida MD Electronically signed by Yolonda Kida MD Signature Date/Time: 12/05/2020/6:14:29 PM    Final    US BIOPSY (KIDNEY)  Result Date: 12/16/2020 CLINICAL DATA:  Kidney injury INDICATION: Kidney injury EXAM:  Ultrasound-guided random renal biopsy MEDICATIONS: None. ANESTHESIA/SEDATION: Moderate (conscious) sedation was employed during this procedure. A total of Versed 0.5 mg and Fentanyl 25 mcg was administered intravenously. Moderate Sedation Time: 12 minutes. The patient's level of consciousness and vital signs were monitored continuously by radiology nursing throughout the procedure under my direct supervision. FLUOROSCOPY TIME:  N/a COMPLICATIONS: None immediate. PROCEDURE: Informed written consent was obtained from the patient after a thorough discussion of the procedural risks, benefits and alternatives. All questions were addressed. Maximal Sterile Barrier Technique was utilized including caps, mask, sterile gowns, sterile gloves, sterile drape, hand hygiene and skin antiseptic. A timeout was performed prior to the initiation of the procedure. The patient was placed supine on the exam table. Limited ultrasound of the bilateral flanks was performed to delineate anatomy. The left lower pole was selected as an appropriate site for random renal biopsy. The overlying skin was marked, prepped and draped in a standard sterile fashion. Local analgesia was obtained with 1% lidocaine. Using ultrasound guidance, a 15 gauge introducer needle was advanced into the left renal pole cortex. Subsequently, core needle biopsy was performed using a 16 gauge core biopsy device x2 passes. Gel-Foam slurry was administered through the introducer needle as it was removed. Limited postprocedure imaging demonstrated expected biopsy changes without large or expanding hematoma. A clean dressing was placed. The patient tolerated the procedure well without immediate complication. IMPRESSION: Successful ultrasound-guided random renal biopsy of the left renal lower pole. Electronically Signed   By: Albin Felling M.D.   On: 12/16/2020 11:51   [place] EKG SITE RITE  Result Date: 11/27/2020 If Site Rite image not attached, placement could not be  confirmed due to current cardiac rhythm.  DG FL GUIDED LUMBAR PUNCTURE  Result Date: 11/20/2020 CLINICAL DATA:  Altered mental status EXAM: DIAGNOSTIC LUMBAR PUNCTURE UNDER FLUOROSCOPIC GUIDANCE COMPARISON:  None FLUOROSCOPY TIME:  Fluoroscopy Time:  12 seconds Radiation Exposure Index (if provided by the fluoroscopic device): 1.6 mGy PROCEDURE: Informed consent was obtained from the patient's husband prior to the procedure, including potential complications of headache, allergy, and pain. With the patient prone, the lower back was prepped with Betadine. 1% Lidocaine was used for local anesthesia. Lumbar puncture was performed at the L4-L5 level using a 20 gauge needle with return of clear CSF. 8 ml of CSF were obtained for laboratory studies. The patient tolerated the procedure well and there were no apparent complications. IMPRESSION: Technically successful fluoroscopic guided lumbar puncture. Electronically Signed   By: Macy Mis M.D.   On: 11/20/2020 11:42    ASSESSMENT: Altered mental status/confusion, acute pancreatitis.  PLAN:    1.  Acute encephalopathy/altered mental status/confusion: Possibly related to underlying lupus/vasculitis.  Patient received 1000 mg IV empiric Rituxan on December 12, 2020.  No apparent change in overall status.  Infectious work-up appears to be negative. Highly unlikely related  to underlying CLL given unimpressive lumbar fluid with negative cytology as well as negative head CT.  Also minimal if any disease present systemically.  Bone marrow biopsy was unrevealing. 2.  Anemia: No evidence of hemolysis with only minimally elevated LDH, normal haptoglobin and no schistocytes reported on recent peripheral smear.  2 units of red blood cells scheduled for today.  Bone marrow biopsy did not reveal any significant pathology.  Will repeat LDH and haptoglobin again today. 3.  Thrombocytopenia: Improved with platelet transfusion.  Monitor. 4.  CLL: Patient has been in  remission since 2016.  No overt systemic evidence of aggressive disease.  5.  Acute renal failure: Unclear etiology.  Possible vasculitis/lupus.  Appreciate nephrology input.  Patient continues to require dialysis.  Rituxan as above.  Kidney biopsy earlier today is pending.  Will follow.   Lloyd Huger, MD   12/16/2020 4:03 PM

## 2020-12-16 NOTE — Progress Notes (Signed)
Progress Note    Trana Ressler   YKD:[id number]  DOB: [birth year]  DOA: 11/19/2020     26  PCP: [name], MD  Initial CC: AMS  Hospital Course: Ms. Victorino is a 55 y.o. female with medical history significant for recent pneumococcal meningitis and bacteremia, depression, hypothyroid, IDDM2 who presented to the ED with AMS.  She underwent extensive work-up after admission with multiple subspecialty consults. Infectious work-up with ID is negative and ID has signed off on 12/09/2020 (see FUO workup below).  She also underwent further work-up with nephrology, oncology, and rheumatology. She has multiple serologies concerning for possible lupus and trying to now obtain renal biopsy to help further diagnose.  Thrombocytopenia has been preventing biopsy obtainment. Due to worsening renal function, she was started on HD.   Interval History:  No further events.  Underwent renal biopsy successfully today.  Seen in her room after returning from procedure.  Husband present bedside.  ROS: Constitutional: positive for fatigue and malaise, negative for chills and fevers, Respiratory: negative for cough and sputum, Cardiovascular: negative for chest pain, and Gastrointestinal: negative for abdominal pain  Assessment & Plan:  * 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[place]) - Serologic work-up indicative of underlying autoimmune process; concern is Lupus vs vasculitis vs RPGN  - s/p 3 days steroids (8/12 - 8/14, solumedrol 1g daily) - oncology, nephrology, and rheumatology following - also started on Rituxan on 8/19 - underwent left renal biopsy on 12/16/20. Follow up findings -Case was discussed with [facility].  If she does warrant plasmapheresis she would then be accepted for transfer but until then had no indication.  Will need to call back [facility] to discuss transfer if and when indicated.  Thrombocytopenia ([place]) - also see pancytopenia below - PLTC down to 21k on 8/17; no overt bleeding but in efforts to  facilitate renal bx will transfuse 2 packs platelets  - PLTC only up to 32k on 8/18; some oozing as expected around trialysis cath; repeat 1 unit Platelet on 8/18 - still low on 8/20; [id number] ordered per nephro on 8/20 - s/p 2 units Platelets on 8/20 - s/p more platelets on 8/22 and 8/23  AKI (acute kidney injury) ([place]) - baseline creat ~0.7 and renal function has continued to deteriorate - nephrology following as well, greatly appreciate assistance - s/p femoral trialysis on 8/17; started 1st HD session on 8/17 as well - continue HD per nephrology  FUO (fever of unknown origin) - ID has followed and signed off on 8/16 (see their note as well) - fevers had defervesced on 8/12 but then recurred on 8/19 prior to Rituxan and then defervesced some; again having fevers on 8/21 - continue trending fever curve; unclear etiology given negative infectious workup recently this may be related to underlying autoimmune process as well - asked ID to weigh in briefly on 8/23, but fevers at this time are still suspected due to underlying autoimmune process - follow up UA/culture and blood cultures   Controlled type 2 diabetes mellitus without complication, without long-term current use of insulin (HCC) - A1c 6.5% on 10/02/20 - s/p steroids and sounds like CBGs elevated at that time  - remains on SSI as well as prandial coverage and basal insulin - she is at risk for hypoglycemia with that regimen (as has already happened) - now that steroids are complete, will decrease her insulin regimen  - d/c prandial and Semglee - use SSI only for now - solumedrol 100 mg given on 8/19 with Rituxan: CBGs elevated since;  will treat acutely but CBGs should improve after steroid washes out as noted above   Personal history of CLL (chronic lymphocytic leukemia) - in remission since 2016 per oncology  - no relapse seen on peripheral smear performed on 11/25/20 "Unremarkable WBC count with unremarkable WBC morphology.  Thrombocytopenia with unremarkable platelet morphology. Findings do not support CLL relapse." - no findings on BM biopsy on 8/11 but repeat biopsy was recommended as well: "Trilineage hematopoiesis (limited material)"  Oral thrush Patient with clinical indications of thrush Associated with difficulty swallowing Patient states poor p.o. intake is driven by appetite rather than pain --s/p Diflucan for 3 doses  Pancytopenia ([place]) - history of CLL as well All 3 cell lines acutely depressed Oncology consulted Patient's CLL has been in remission since 2016 Recurrence of hematologic note malignancy felt unlikely --s/p 1u PRBC 8/5  - s/p 1u PRBC 8/10 - s/p 1u PRBC 8/18 - s/p 1u PRBC 8/21 - s/p 2u PRBC 8/23  Hypokalemia - Replete and recheck as needed  Hypomagnesemia - Replete and recheck as needed  Acute metabolic encephalopathy Unclear etiology --meningitis ruled out by LP.  CT head neg for new stroke.  Hematology ruled out CLL as the culprit.  No seizure on EEG. --mental status dramatically improved with treatment of pancreatitis and IV thiamine repletion. --MRI brain, no acute finding -- Completed high-dose IV thiamine x3 days  Hyperlipidemia, mixed - Continue statin  Essential hypertension - Continue Cardizem and hydralazine  Hypothyroidism - Continue Synthroid  Pancreatitis-resolved as of 12/11/2020 Unclear etiology No reported history of alcohol intake, no radiographic evidence of gallstones Possible drug-induced Initial abdominal examination negative for tenderness Lipase trending down with IVF, but much slowly than usual. Triglycerides negative - now resolved    Old records reviewed in assessment of this patient  Antimicrobials:   DVT prophylaxis: Place TED hose Start: 11/19/20 2301   Code Status:   Code Status: Full Code Family Communication: Husband  Disposition Plan: Status is: Inpatient  Remains inpatient appropriate because:Inpatient level of  care appropriate due to severity of illness  Dispo: The patient is from: Home              Anticipated d/c is to: Home              Patient currently is not medically stable to d/c.   Difficult to place patient No  Risk of unplanned readmission score: Unplanned Admission- Pilot do not use: 49.47   Objective: Blood pressure 117/79, pulse 73, temperature 99.1 F (37.3 C), resp. rate 16, height 6' (1.829 m), weight 94.4 kg, SpO2 97 %.  Examination: General appearance: alert, cooperative, and no distress Head:  face appears slightly oversized Mouth: no thrush noted; Torus Palatinus noted  Eyes:  EOMI Lungs: clear to auscultation bilaterally Heart: regular rate and rhythm and S1, S2 normal Abdomen: normal findings: bowel sounds normal and soft, non-tender Extremities:  no LE edema; swollen joints noted in ankle, wrist; some edema in fingers Skin:  excess skin noted due to weight loss Neurologic: no focal deficits   Consultants:  Oncology/hematology Rheumatology Nephrology ID  Procedures:    Data Reviewed: I have personally reviewed following labs and imaging studies Results for orders placed or performed during the hospital encounter of 11/19/20 (from the past 24 hour(s))  Glucose, capillary     Status: Abnormal   Collection Time: 12/15/20  4:45 PM  Result Value Ref Range   Glucose-Capillary 288 (H) 70 - 99 mg/dL  Urinalysis, Routine w reflex  microscopic Urine, Clean Catch     Status: Abnormal   Collection Time: 12/15/20  5:54 PM  Result Value Ref Range   Color, Urine YELLOW (A) YELLOW   APPearance CLOUDY (A) CLEAR   Specific Gravity, Urine 1.013 1.005 - 1.030   pH 9.0 (H) 5.0 - 8.0   Glucose, UA 50 (A) NEGATIVE mg/dL   Hgb urine dipstick LARGE (A) NEGATIVE   Bilirubin Urine NEGATIVE NEGATIVE   Ketones, ur NEGATIVE NEGATIVE mg/dL   Protein, ur 100 (A) NEGATIVE mg/dL   Nitrite NEGATIVE NEGATIVE   Leukocytes,Ua SMALL (A) NEGATIVE   RBC / HPF >50 (H) 0 - 5 RBC/hpf   WBC,  UA 21-50 0 - 5 WBC/hpf   Bacteria, UA RARE (A) NONE SEEN   Squamous Epithelial / LPF 0-5 0 - 5   Budding Yeast PRESENT   Glucose, capillary     Status: Abnormal   Collection Time: 12/15/20  8:40 PM  Result Value Ref Range   Glucose-Capillary 235 (H) 70 - 99 mg/dL  Platelet count     Status: Abnormal   Collection Time: 12/15/20  9:42 PM  Result Value Ref Range   Platelets 65 (L) 150 - 400 K/uL  CBC with Differential/Platelet     Status: Abnormal   Collection Time: 12/16/20  5:16 AM  Result Value Ref Range   WBC 7.6 4.0 - 10.5 K/uL   RBC 2.40 (L) 3.87 - 5.11 MIL/uL   Hemoglobin 6.7 (L) 12.0 - 15.0 g/dL   HCT 20.2 (L) 36.0 - 46.0 %   MCV 84.2 80.0 - 100.0 fL   MCH 27.9 26.0 - 34.0 pg   MCHC 33.2 30.0 - 36.0 g/dL   RDW 16.1 (H) 11.5 - 15.5 %   Platelets 57 (L) 150 - 400 K/uL   nRBC 0.0 0.0 - 0.2 %   Neutrophils Relative % 73 %   Neutro Abs 5.5 1.7 - 7.7 K/uL   Lymphocytes Relative 22 %   Lymphs Abs 1.7 0.7 - 4.0 K/uL   Monocytes Relative 3 %   Monocytes Absolute 0.2 0.1 - 1.0 K/uL   Eosinophils Relative 1 %   Eosinophils Absolute 0.1 0.0 - 0.5 K/uL   Basophils Relative 0 %   Basophils Absolute 0.0 0.0 - 0.1 K/uL   WBC Morphology MORPHOLOGY UNREMARKABLE    RBC Morphology MORPHOLOGY UNREMARKABLE    Smear Review Normal platelet morphology    Immature Granulocytes 1 %   Abs Immature Granulocytes 0.07 0.00 - 0.07 K/uL  Protime-INR     Status: None   Collection Time: 12/16/20  5:16 AM  Result Value Ref Range   Prothrombin Time 14.4 11.4 - 15.2 seconds   INR 1.1 0.8 - 1.2  Comprehensive metabolic panel     Status: Abnormal   Collection Time: 12/16/20  5:16 AM  Result Value Ref Range   Sodium 137 135 - 145 mmol/L   Potassium 4.0 3.5 - 5.1 mmol/L   Chloride 100 98 - 111 mmol/L   CO2 30 22 - 32 mmol/L   Glucose, Bld 119 (H) 70 - 99 mg/dL   BUN 43 (H) 6 - 20 mg/dL   Creatinine, Ser 4.99 (H) 0.44 - 1.00 mg/dL   Calcium 7.4 (L) 8.9 - 10.3 mg/dL   Total Protein 4.6 (L) 6.5 - 8.1  g/dL   Albumin 2.0 (L) 3.5 - 5.0 g/dL   AST 53 (H) 15 - 41 U/L   ALT 15 0 - 44 U/L   Alkaline  Phosphatase 78 38 - 126 U/L   Total Bilirubin 0.7 0.3 - 1.2 mg/dL   GFR, Estimated 10 (L) >60 mL/min   Anion gap 7 5 - 15  Magnesium     Status: None   Collection Time: 12/16/20  5:16 AM  Result Value Ref Range   Magnesium 2.0 1.7 - 2.4 mg/dL  Prepare RBC (crossmatch)     Status: None   Collection Time: 12/16/20  7:25 AM  Result Value Ref Range   Order Confirmation      ORDER PROCESSED BY BLOOD BANK Performed at [facility], [address] [place], [place] [address]   Prepare Pheresed Platelets     Status: None (Preliminary result)   Collection Time: 12/16/20  7:34 AM  Result Value Ref Range   Unit Number [id number]    Blood Component Type PLTP2 PSORALEN TREATED    Unit division 00    Status of Unit ISSUED    Transfusion Status      OK TO TRANSFUSE Performed at [facility], [place], [place] [address]   Glucose, capillary     Status: Abnormal   Collection Time: 12/16/20  8:07 AM  Result Value Ref Range   Glucose-Capillary 129 (H) 70 - 99 mg/dL  Type and screen     Status: None (Preliminary result)   Collection Time: 12/16/20  8:26 AM  Result Value Ref Range   ABO/RH(D) O POS    Antibody Screen POS    Sample Expiration 12/19/2020,2359    Antibody Identification WARM AUTOANTIBODY    Unit Number [id number]    Blood Component Type RBC, LR IRR    Unit division 00    Status of Unit ALLOCATED    Transfusion Status OK TO TRANSFUSE    Crossmatch Result COMPATIBLE    Unit Number [id number]    Blood Component Type RCLI PHER 1    Unit division 00    Status of Unit ALLOCATED    Transfusion Status OK TO TRANSFUSE    Crossmatch Result COMPATIBLE    Unit Number [id number]    Blood Component Type RCLI PHER 2    Unit division 00    Status of Unit ALLOCATED    Transfusion Status OK TO TRANSFUSE    Crossmatch Result COMPATIBLE    Glucose, capillary     Status: Abnormal   Collection Time: 12/16/20 12:12 PM  Result Value Ref Range   Glucose-Capillary 128 (H) 70 - 99 mg/dL    Recent Results (from the past 240 hour(s))  Culture, blood (single) w Reflex to ID Panel     Status: None (Preliminary result)   Collection Time: 12/14/20  2:36 PM   Specimen: BLOOD  Result Value Ref Range Status   Specimen Description BLOOD BLOOD RIGHT HAND  Final   Special Requests   Final    BOTTLES DRAWN AEROBIC AND ANAEROBIC Blood Culture adequate volume   Culture   Final    NO GROWTH 2 DAYS Performed at [facility], [address], [place],  [address]    Report Status PENDING  Incomplete  Culture, blood (single) w Reflex to ID Panel     Status: None (Preliminary result)   Collection Time: 12/14/20  3:38 PM   Specimen: BLOOD  Result Value Ref Range Status   Specimen Description BLOOD A-LINE DRAW  Final   Special Requests   Final    BOTTLES DRAWN AEROBIC AND ANAEROBIC Blood Culture adequate volume   Culture  Final    NO GROWTH 2 DAYS Performed at [facility], [address] [place], [place] [address]    Report Status PENDING  Incomplete     Radiology Studies: US BIOPSY (KIDNEY)  Result Date: 12/16/2020 CLINICAL DATA:  Kidney injury INDICATION: Kidney injury EXAM: Ultrasound-guided random renal biopsy MEDICATIONS: None. ANESTHESIA/SEDATION: Moderate (conscious) sedation was employed during this procedure. A total of Versed 0.5 mg and Fentanyl 25 mcg was administered intravenously. Moderate Sedation Time: 12 minutes. The patient's level of consciousness and vital signs were monitored continuously by radiology nursing throughout the procedure under my direct supervision. FLUOROSCOPY TIME:  N/a COMPLICATIONS: None immediate. PROCEDURE: Informed written consent was obtained from the patient after a thorough discussion of the procedural risks, benefits and alternatives. All questions were addressed. Maximal  Sterile Barrier Technique was utilized including caps, mask, sterile gowns, sterile gloves, sterile drape, hand hygiene and skin antiseptic. A timeout was performed prior to the initiation of the procedure. The patient was placed supine on the exam table. Limited ultrasound of the bilateral flanks was performed to delineate anatomy. The left lower pole was selected as an appropriate site for random renal biopsy. The overlying skin was marked, prepped and draped in a standard sterile fashion. Local analgesia was obtained with 1% lidocaine. Using ultrasound guidance, a 15 gauge introducer needle was advanced into the left renal pole cortex. Subsequently, core needle biopsy was performed using a 16 gauge core biopsy device x2 passes. Gel-Foam slurry was administered through the introducer needle as it was removed. Limited postprocedure imaging demonstrated expected biopsy changes without large or expanding hematoma. A clean dressing was placed. The patient tolerated the procedure well without immediate complication. IMPRESSION: Successful ultrasound-guided random renal biopsy of the left renal lower pole. Electronically Signed   By: Albin Felling M.D.   On: 12/16/2020 11:51   US BIOPSY (KIDNEY)  Final Result    US Venous Img Upper Bilat (DVT)  Final Result    US Venous Img Lower Bilateral (DVT)  Final Result    DG Chest 1 View  Final Result    CT BONE MARROW BIOPSY  Final Result    US RENAL  Final Result    MR Lumbar Spine W Wo Contrast  Final Result    MR THORACIC SPINE W WO CONTRAST  Final Result    MR CERVICAL SPINE W WO CONTRAST  Final Result    MR BRAIN W WO CONTRAST  Final Result    DG Chest Port 1 View  Final Result    [place] EKG SITE RITE  Final Result    CT HEAD W & WO CONTRAST  Final Result    CT CHEST ABDOMEN PELVIS W CONTRAST  Final Result    DG FL GUIDED LUMBAR PUNCTURE  Final Result    CT Head Wo Contrast  Final Result    DG Chest Portable 1 View  Final  Result      Scheduled Meds:  sodium chloride   Intravenous Once   amLODipine  5 mg Oral Daily   Chlorhexidine Gluconate Cloth  6 each Topical Daily   diltiazem  120 mg Oral Daily   feeding supplement  1 Container Oral TID BM   insulin aspart  0-15 Units Subcutaneous TID WC   insulin aspart  0-5 Units Subcutaneous QHS   levothyroxine  100 mcg Oral QAC breakfast   magnesium oxide  800 mg Oral BID   mirtazapine  15 mg Oral QHS   multivitamin with  minerals  1 tablet Oral Daily   pantoprazole  40 mg Oral Daily   pneumococcal 13-valent conjugate vaccine  0.5 mL Intramuscular Tomorrow-1000   psyllium  1 packet Oral Daily   rosuvastatin  10 mg Oral QHS   sodium bicarbonate  650 mg Oral TID   sodium chloride flush  10-40 mL Intracatheter Q12H   thiamine  100 mg Oral Daily   PRN Meds: acetaminophen **OR** acetaminophen, albuterol, haloperidol lactate, loperamide, ondansetron **OR** ondansetron (ZOFRAN) IV, oxyCODONE, polyethylene glycol, sodium chloride flush Continuous Infusions:   LOS: 26 days  Time spent: Greater than 50% of the 35 minute visit was spent in counseling/coordination of care for the patient as laid out in the A&P.   Dwyane Dee, MD [facility] 12/16/2020, 12:43 PM

## 2020-12-16 NOTE — Progress Notes (Signed)
Report to rebecca frail, rn.

## 2020-12-17 ENCOUNTER — Other Ambulatory Visit (INDEPENDENT_AMBULATORY_CARE_PROVIDER_SITE_OTHER): Payer: Self-pay | Admitting: Vascular Surgery

## 2020-12-17 ENCOUNTER — Encounter
Admission: EM | Disposition: A | Discharge status: Home Health | Payer: Self-pay | Source: Home / Self Care | Attending: Internal Medicine

## 2020-12-17 DIAGNOSIS — N186 End stage renal disease: Secondary | ICD-10-CM | POA: Diagnosis not present

## 2020-12-17 DIAGNOSIS — D8989 Other specified disorders involving the immune mechanism, not elsewhere classified: Secondary | ICD-10-CM | POA: Diagnosis not present

## 2020-12-17 HISTORY — PX: DIALYSIS/PERMA CATHETER INSERTION: CATH118288

## 2020-12-17 LAB — CBC WITH DIFFERENTIAL/PLATELET
Abs Immature Granulocytes: 0.05 10*3/uL (ref 0.00–0.07)
Basophils Absolute: 0 10*3/uL (ref 0.0–0.1)
Basophils Relative: 0 %
Eosinophils Absolute: 0 10*3/uL (ref 0.0–0.5)
Eosinophils Relative: 0 %
HCT: 25.9 % — ABNORMAL LOW (ref 36.0–46.0)
Hemoglobin: 9 g/dL — ABNORMAL LOW (ref 12.0–15.0)
Immature Granulocytes: 1 %
Lymphocytes Relative: 20 %
Lymphs Abs: 1.4 10*3/uL (ref 0.7–4.0)
MCH: 29 pg (ref 26.0–34.0)
MCHC: 34.7 g/dL (ref 30.0–36.0)
MCV: 83.5 fL (ref 80.0–100.0)
Monocytes Absolute: 0.2 10*3/uL (ref 0.1–1.0)
Monocytes Relative: 2 %
Neutro Abs: 5.2 10*3/uL (ref 1.7–7.7)
Neutrophils Relative %: 77 %
Platelets: 44 10*3/uL — ABNORMAL LOW (ref 150–400)
RBC: 3.1 MIL/uL — ABNORMAL LOW (ref 3.87–5.11)
RDW: 15.4 % (ref 11.5–15.5)
WBC: 6.9 10*3/uL (ref 4.0–10.5)
nRBC: 0 % (ref 0.0–0.2)

## 2020-12-17 LAB — COMPREHENSIVE METABOLIC PANEL
ALT: 18 U/L (ref 0–44)
AST: 70 U/L — ABNORMAL HIGH (ref 15–41)
Albumin: 2.1 g/dL — ABNORMAL LOW (ref 3.5–5.0)
Alkaline Phosphatase: 84 U/L (ref 38–126)
Anion gap: 7 (ref 5–15)
BUN: 25 mg/dL — ABNORMAL HIGH (ref 6–20)
CO2: 30 mmol/L (ref 22–32)
Calcium: 7.1 mg/dL — ABNORMAL LOW (ref 8.9–10.3)
Chloride: 98 mmol/L (ref 98–111)
Creatinine, Ser: 3.65 mg/dL — ABNORMAL HIGH (ref 0.44–1.00)
GFR, Estimated: 14 mL/min — ABNORMAL LOW (ref >60)
Glucose, Bld: 187 mg/dL — ABNORMAL HIGH (ref 70–99)
Potassium: 3.6 mmol/L (ref 3.5–5.1)
Sodium: 135 mmol/L (ref 135–145)
Total Bilirubin: 0.9 mg/dL (ref 0.3–1.2)
Total Protein: 4.8 g/dL — ABNORMAL LOW (ref 6.5–8.1)

## 2020-12-17 LAB — PREPARE PLATELET PHERESIS: Unit division: 0

## 2020-12-17 LAB — BPAM PLATELET PHERESIS
Blood Product Expiration Date: 202208252359
ISSUE DATE / TIME: 202208230814
Unit Type and Rh: 600

## 2020-12-17 LAB — MAGNESIUM: Magnesium: 1.7 mg/dL (ref 1.7–2.4)

## 2020-12-17 LAB — GLUCOSE, CAPILLARY
Glucose-Capillary: 163 mg/dL — ABNORMAL HIGH (ref 70–99)
Glucose-Capillary: 208 mg/dL — ABNORMAL HIGH (ref 70–99)
Glucose-Capillary: 186 mg/dL — ABNORMAL HIGH (ref 70–99)
Glucose-Capillary: 144 mg/dL — ABNORMAL HIGH (ref 70–99)
Glucose-Capillary: 248 mg/dL — ABNORMAL HIGH (ref 70–99)

## 2020-12-17 LAB — PROCALCITONIN: Procalcitonin: 3.77 ng/mL

## 2020-12-17 SURGERY — DIALYSIS/PERMA CATHETER INSERTION
Anesthesia: Moderate Sedation

## 2020-12-17 MED ORDER — DIPHENHYDRAMINE HCL 50 MG/ML IJ SOLN: 50.0000 mg | Freq: Once | INTRAMUSCULAR | Status: DC | PRN

## 2020-12-17 MED ORDER — METHYLPREDNISOLONE SODIUM SUCC 125 MG IJ SOLR: 125.0000 mg | Freq: Once | INTRAMUSCULAR | Status: DC | PRN

## 2020-12-17 MED ORDER — MIDAZOLAM HCL 2 MG/2ML IJ SOLN
INTRAMUSCULAR | Status: DC | PRN
  Administered 2020-12-17: 2 mg via INTRAVENOUS

## 2020-12-17 MED ORDER — FAMOTIDINE 20 MG PO TABS: 40.0000 mg | ORAL_TABLET | Freq: Once | ORAL | Status: DC | PRN

## 2020-12-17 MED ORDER — CLINDAMYCIN PHOSPHATE 300 MG/50ML IV SOLN
INTRAVENOUS | Status: AC
  Administered 2020-12-17: 300 mg via INTRAVENOUS
  Filled 2020-12-17: qty 50

## 2020-12-17 MED ORDER — MIDAZOLAM HCL 2 MG/2ML IJ SOLN
INTRAMUSCULAR | Status: AC
  Filled 2020-12-17: qty 2

## 2020-12-17 MED ORDER — CLINDAMYCIN PHOSPHATE 300 MG/50ML IV SOLN: 300.0000 mg | Freq: Once | INTRAVENOUS | Status: AC

## 2020-12-17 MED ORDER — FENTANYL CITRATE PF 50 MCG/ML IJ SOSY
PREFILLED_SYRINGE | INTRAMUSCULAR | Status: AC
  Filled 2020-12-17: qty 1

## 2020-12-17 MED ORDER — HYDROMORPHONE HCL 1 MG/ML IJ SOLN: 1.0000 mg | Freq: Once | INTRAMUSCULAR | Status: DC | PRN

## 2020-12-17 MED ORDER — VALACYCLOVIR HCL 500 MG PO TABS
500.0000 mg | ORAL_TABLET | Freq: Every day | ORAL | Status: DC
  Administered 2020-12-17 – 2020-12-22 (×6): 500 mg via ORAL
  Filled 2020-12-17 (×6): qty 1

## 2020-12-17 MED ORDER — VALACYCLOVIR HCL 500 MG PO TABS
500.0000 mg | ORAL_TABLET | Freq: Two times a day (BID) | ORAL | Status: DC
  Filled 2020-12-17 (×2): qty 1

## 2020-12-17 MED ORDER — SODIUM CHLORIDE 0.9 % IV SOLN: INTRAVENOUS | Status: DC

## 2020-12-17 MED ORDER — MIDAZOLAM HCL 2 MG/ML PO SYRP: 8.0000 mg | ORAL_SOLUTION | Freq: Once | ORAL | Status: DC | PRN

## 2020-12-17 MED ORDER — FENTANYL CITRATE (PF) 100 MCG/2ML IJ SOLN
INTRAMUSCULAR | Status: DC | PRN
  Administered 2020-12-17: 50 ug via INTRAVENOUS

## 2020-12-17 MED ORDER — ONDANSETRON HCL 4 MG/2ML IJ SOLN: 4.0000 mg | Freq: Four times a day (QID) | INTRAMUSCULAR | Status: DC | PRN

## 2020-12-17 SURGICAL SUPPLY — 6 items
CATH CANNON HEMO 15FR 19 (HEMODIALYSIS SUPPLIES) ×2 IMPLANT
COVER PROBE U/S 5X48 (MISCELLANEOUS) ×2 IMPLANT
NEEDLE ENTRY 21GA 7CM ECHOTIP (NEEDLE) ×2 IMPLANT
PACK ANGIOGRAPHY (CUSTOM PROCEDURE TRAY) ×2 IMPLANT
SUT MNCRL AB 4-0 PS2 18 (SUTURE) ×2 IMPLANT
SUT PROLENE 0 CT 1 30 (SUTURE) ×2 IMPLANT

## 2020-12-17 NOTE — Progress Notes (Signed)
   12/17/20 0811  Assess: MEWS Score  Temp (!) 102.4 F (39.1 C)  BP (!) 144/74  Pulse Rate 71  Resp 16  SpO2 98 %  O2 Device Room Air  Assess: MEWS Score  MEWS Temp 2  MEWS Systolic 0  MEWS Pulse 0  MEWS RR 0  MEWS LOC 0  MEWS Score 2  MEWS Score Color Yellow  Assess: if the MEWS score is Yellow or Red  Were vital signs taken at a resting state? Yes  Focused Assessment Change from prior assessment (see assessment flowsheet)  Does the patient meet 2 or more of the SIRS criteria? No  Does the patient have a confirmed or suspected source of infection? No  Provider and Rapid Response Notified? No (provider aware)  MEWS guidelines implemented *See Row Information* Yes  Treat  MEWS Interventions Administered scheduled meds/treatments  Pain Scale 0-10  Pain Score 0  Take Vital Signs  Increase Vital Sign Frequency  Yellow: Q 2hr X 2 then Q 4hr X 2, if remains yellow, continue Q 4hrs  Escalate  MEWS: Escalate Yellow: discuss with charge nurse/RN and consider discussing with provider and RRT  Notify: Charge Nurse/RN  Name of Charge Nurse/RN Notified Debi Pinkerton RN  Date Charge Nurse/RN Notified 12/17/20  Time Charge Nurse/RN Notified 0818  Notify: Provider  Provider Name/Title Avon Gully  Date Provider Notified 12/17/20  Time Provider Notified [phone]  Notification Type Page  Notification Reason Other (Comment) (fever)  Provider response No new orders  Date of Provider Response 12/17/20  Time of Provider Response [phone]  Document  Patient Outcome Other (Comment) (give meds, continue to monitor)  Progress note created (see row info) Yes  Assess: SIRS CRITERIA  SIRS Temperature  1  SIRS Pulse 0  SIRS Respirations  0  SIRS WBC 0  SIRS Score Sum  1

## 2020-12-17 NOTE — Progress Notes (Signed)
Progress Note    Tonya Myers   KGY:[id number]  DOB: [birth year]  DOA: 11/19/2020     27  PCP: [name], MD  Initial CC: AMS  Hospital Course: Tonya Myers is a 55 y.o. female with medical history significant for recent pneumococcal meningitis and bacteremia, depression, hypothyroid, IDDM2 who presented to the ED with AMS. She underwent extensive work-up after admission with multiple subspecialty consults. Infectious work-up with ID is negative and ID has signed off on 12/09/2020 (see FUO workup below). She also underwent further work-up with nephrology, oncology, and rheumatology. She has multiple serologies concerning for possible lupus and trying to now obtain renal biopsy to help further diagnose.  Thrombocytopenia has been preventing biopsy obtainment. Due to worsening renal function, she was started on HD.   Interval History:  No acute issues or events overnight denies nausea vomiting diarrhea constipation headache fevers chills or chest pain  Assessment & Plan:  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[place]) - Serologic work-up indicative of underlying autoimmune process; concern is Lupus vs vasculitis vs RPGN  - s/p 3 days steroids (8/12 - 8/14, solumedrol 1g daily) - oncology, nephrology, and rheumatology following - also started on Rituxan on 8/19 - underwent left renal biopsy on 12/16/20.  Results pending -Case was previously discussed with [facility].  If she does warrant plasmapheresis she would then be accepted for transfer but until then had no indication.  Will need to call back [facility] to discuss transfer if and when indicated.  Oral thrush Resolved --s/p Diflucan for 3 doses  Pancytopenia ([place]) History of CLL  Oncology consulted Patient's CLL has been in remission since 2016 Recurrence of hematologic note malignancy felt unlikely --s/p 1u PRBC 8/5  - s/p 1u PRBC 8/10 - s/p 1u PRBC 8/18 - s/p 1u PRBC 8/21 - s/p 2u PRBC 8/23  FUO (fever of unknown origin) - Likely autoimmune in  etiology - ID has followed initially and signed off on 8/16 due to lack of any infectious process -reconsulted on 823 with negative procalcitonin again likely autoimmune - Fevers initially resolved with steroids, now recurrent fevers daily after steroid cessation - Follow up UA/culture and blood cultures   Thrombocytopenia ([place]) - also see pancytopenia below - PLTC down to 21k on 8/17; no overt bleeding but in efforts to facilitate renal bx will transfuse 2 packs platelets  - PLTC only up to 32k on 8/18; some oozing as expected around trialysis cath; repeat 1 unit Platelet on 8/18 - still low on 8/20; [id number] ordered per nephro on 8/20 - s/p 2 units Platelets on 8/20 - s/p more platelets on 8/22 and 8/23  Hypokalemia/Hypomagnesemia - Replete and recheck as needed - WNL  AKI (acute kidney injury) ([place]) - baseline creat ~0.7 and renal function has continued to deteriorate - nephrology following  -Renal biopsy 12/16/2020 results pending - s/p femoral trialysis on 8/17; started 1st HD session on 8/17 as well -transition to permacath 12/17/2020 - continue HD per nephrology  Acute metabolic encephalopathy, improving - Likely autoimmune in etiology  --meningitis ruled out by LP.  CT head neg for new stroke.  Hematology ruled out CLL as the culprit.  No seizure on EEG. --mental status dramatically improved with treatment of pancreatitis and IV thiamine repletion. --MRI brain, no acute finding -- Completed high-dose IV thiamine x3 days -awaiting biopsy results before reinitiation of steroids  Hyperlipidemia, mixed - Continue statin  Essential hypertension - Continue Cardizem and hydralazine  Controlled type 2 diabetes mellitus without complication, without long-term current use of  insulin ([place]) - A1c 6.5% on 10/02/20 -Transiently elevated with steroids, improving   -Continue sliding scale insulin, hypoglycemic protocol  Hypothyroidism - Continue Synthroid  Personal history of CLL  (chronic lymphocytic leukemia) - in remission since 2016 per oncology  - no relapse seen on peripheral smear performed on 11/25/20 "Unremarkable WBC count with unremarkable WBC morphology. Thrombocytopenia with unremarkable platelet morphology. Findings do not support CLL relapse." - no findings on BM biopsy on 8/11 but repeat biopsy was recommended as well: "Trilineage hematopoiesis (limited material)"  Pancreatitis, POA-resolved as of 12/11/2020 Unclear etiology -no alcohol use, questionably drug-induced but imaging and symptoms negative for gallstone or obstructive etiology, triglycerides within normal limits  DVT prophylaxis: Place TED hose Start: 11/19/20 2301   Code Status:   Code Status: Full Code Family Communication: Husband at bedside  Disposition Plan: Status is: Inpatient  Remains inpatient appropriate because:Inpatient level of care appropriate due to severity of illness  Dispo: The patient is from: Home              Anticipated d/c is to: Home              Patient currently is not medically stable to d/c.   Difficult to place patient No  Risk of unplanned readmission score: Unplanned Admission- Pilot do not use: 49.76   Objective: Blood pressure 127/67, pulse 69, temperature (!) 100.7 F (38.2 C), resp. rate 16, height 6' (1.829 m), weight 94.4 kg, SpO2 95 %.   Examination: General appearance: alert, cooperative, and no distress Head: Normocephalic atraumatic Mouth: no thrush noted; Torus Palatinus noted  Eyes:  EOMI Lungs: clear to auscultation bilaterally Heart: regular rate and rhythm and S1, S2 normal Abdomen: normal findings: bowel sounds normal and soft, non-tender Extremities:  no LE edema; swollen joints noted in ankle, wrist; some edema in fingers Skin:  excess skin noted due to weight loss Neurologic: no focal deficits   Consultants:  Oncology/hematology Rheumatology Nephrology ID  Procedures:  Renal biopsy 12/16/2020  Data Reviewed: I have  personally reviewed following labs and imaging studies Results for orders placed or performed during the hospital encounter of 11/19/20 (from the past 24 hour(s))  Prepare RBC (crossmatch)     Status: None   Collection Time: 12/16/20  7:25 AM  Result Value Ref Range   Order Confirmation      ORDER PROCESSED BY BLOOD BANK Performed at [facility], [address] [place], [place] [address]   Prepare Pheresed Platelets     Status: None (Preliminary result)   Collection Time: 12/16/20  7:34 AM  Result Value Ref Range   Unit Number [id number]    Blood Component Type PLTP2 PSORALEN TREATED    Unit division 00    Status of Unit ISSUED    Transfusion Status      OK TO TRANSFUSE Performed at [facility], [place], [place] [address]   Glucose, capillary     Status: Abnormal   Collection Time: 12/16/20  8:07 AM  Result Value Ref Range   Glucose-Capillary 129 (H) 70 - 99 mg/dL  Type and screen     Status: None (Preliminary result)   Collection Time: 12/16/20  8:26 AM  Result Value Ref Range   ABO/RH(D) O POS    Antibody Screen POS    Sample Expiration 12/19/2020,2359    Antibody Identification WARM AUTOANTIBODY    Unit Number [id number]    Blood Component Type RBC, LR IRR    Unit division  00    Status of Unit ISSUED    Transfusion Status OK TO TRANSFUSE    Crossmatch Result COMPATIBLE    Unit Number [id number]    Blood Component Type RCLI PHER 1    Unit division 00    Status of Unit ALLOCATED    Transfusion Status OK TO TRANSFUSE    Crossmatch Result COMPATIBLE    Unit Number [id number]    Blood Component Type RCLI PHER 2    Unit division 00    Status of Unit ALLOCATED    Transfusion Status OK TO TRANSFUSE    Crossmatch Result COMPATIBLE    Unit Number [id number]    Blood Component Type RBC, LR IRR    Unit division 00    Status of Unit ISSUED    Transfusion Status OK TO TRANSFUSE    Crossmatch Result COMPATIBLE   Glucose,  capillary     Status: Abnormal   Collection Time: 12/16/20 12:12 PM  Result Value Ref Range   Glucose-Capillary 128 (H) 70 - 99 mg/dL  Lactate dehydrogenase     Status: Abnormal   Collection Time: 12/16/20  4:12 PM  Result Value Ref Range   LDH 365 (H) 98 - 192 U/L  Glucose, capillary     Status: Abnormal   Collection Time: 12/16/20  6:40 PM  Result Value Ref Range   Glucose-Capillary 149 (H) 70 - 99 mg/dL  Glucose, capillary     Status: Abnormal   Collection Time: 12/16/20  9:53 PM  Result Value Ref Range   Glucose-Capillary 137 (H) 70 - 99 mg/dL  CBC with Differential/Platelet     Status: Abnormal   Collection Time: 12/17/20  3:46 AM  Result Value Ref Range   WBC 6.9 4.0 - 10.5 K/uL   RBC 3.10 (L) 3.87 - 5.11 MIL/uL   Hemoglobin 9.0 (L) 12.0 - 15.0 g/dL   HCT 25.9 (L) 36.0 - 46.0 %   MCV 83.5 80.0 - 100.0 fL   MCH 29.0 26.0 - 34.0 pg   MCHC 34.7 30.0 - 36.0 g/dL   RDW 15.4 11.5 - 15.5 %   Platelets 44 (L) 150 - 400 K/uL   nRBC 0.0 0.0 - 0.2 %   Neutrophils Relative % 77 %   Neutro Abs 5.2 1.7 - 7.7 K/uL   Lymphocytes Relative 20 %   Lymphs Abs 1.4 0.7 - 4.0 K/uL   Monocytes Relative 2 %   Monocytes Absolute 0.2 0.1 - 1.0 K/uL   Eosinophils Relative 0 %   Eosinophils Absolute 0.0 0.0 - 0.5 K/uL   Basophils Relative 0 %   Basophils Absolute 0.0 0.0 - 0.1 K/uL   Immature Granulocytes 1 %   Abs Immature Granulocytes 0.05 0.00 - 0.07 K/uL  Comprehensive metabolic panel     Status: Abnormal   Collection Time: 12/17/20  3:46 AM  Result Value Ref Range   Sodium 135 135 - 145 mmol/L   Potassium 3.6 3.5 - 5.1 mmol/L   Chloride 98 98 - 111 mmol/L   CO2 30 22 - 32 mmol/L   Glucose, Bld 187 (H) 70 - 99 mg/dL   BUN 25 (H) 6 - 20 mg/dL   Creatinine, Ser 3.65 (H) 0.44 - 1.00 mg/dL   Calcium 7.1 (L) 8.9 - 10.3 mg/dL   Total Protein 4.8 (L) 6.5 - 8.1 g/dL   Albumin 2.1 (L) 3.5 - 5.0 g/dL   AST 70 (H) 15 - 41 U/L   ALT 18 0 - 44  U/L   Alkaline Phosphatase 84 38 - 126 U/L    Total Bilirubin 0.9 0.3 - 1.2 mg/dL   GFR, Estimated 14 (L) >60 mL/min   Anion gap 7 5 - 15  Magnesium     Status: None   Collection Time: 12/17/20  3:46 AM  Result Value Ref Range   Magnesium 1.7 1.7 - 2.4 mg/dL    Recent Results (from the past 240 hour(s))  Culture, blood (single) w Reflex to ID Panel     Status: None (Preliminary result)   Collection Time: 12/14/20  2:36 PM   Specimen: BLOOD  Result Value Ref Range Status   Specimen Description BLOOD BLOOD RIGHT HAND  Final   Special Requests   Final    BOTTLES DRAWN AEROBIC AND ANAEROBIC Blood Culture adequate volume   Culture   Final    NO GROWTH 2 DAYS Performed at [facility], [address] [place], [place] [address]    Report Status PENDING  Incomplete  Culture, blood (single) w Reflex to ID Panel     Status: None (Preliminary result)   Collection Time: 12/14/20  3:38 PM   Specimen: BLOOD  Result Value Ref Range Status   Specimen Description BLOOD A-LINE DRAW  Final   Special Requests   Final    BOTTLES DRAWN AEROBIC AND ANAEROBIC Blood Culture adequate volume   Culture   Final    NO GROWTH 2 DAYS Performed at [facility], [address] [place],  [address]    Report Status PENDING  Incomplete     Radiology Studies: US BIOPSY (KIDNEY)  Result Date: 12/16/2020 CLINICAL DATA:  Kidney injury INDICATION: Kidney injury EXAM: Ultrasound-guided random renal biopsy MEDICATIONS: None. ANESTHESIA/SEDATION: Moderate (conscious) sedation was employed during this procedure. A total of Versed 0.5 mg and Fentanyl 25 mcg was administered intravenously. Moderate Sedation Time: 12 minutes. The patient's level of consciousness and vital signs were monitored continuously by radiology nursing throughout the procedure under my direct supervision. FLUOROSCOPY TIME:  N/a COMPLICATIONS: None immediate. PROCEDURE: Informed written consent was obtained from the patient after a thorough discussion of the procedural  risks, benefits and alternatives. All questions were addressed. Maximal Sterile Barrier Technique was utilized including caps, mask, sterile gowns, sterile gloves, sterile drape, hand hygiene and skin antiseptic. A timeout was performed prior to the initiation of the procedure. The patient was placed supine on the exam table. Limited ultrasound of the bilateral flanks was performed to delineate anatomy. The left lower pole was selected as an appropriate site for random renal biopsy. The overlying skin was marked, prepped and draped in a standard sterile fashion. Local analgesia was obtained with 1% lidocaine. Using ultrasound guidance, a 15 gauge introducer needle was advanced into the left renal pole cortex. Subsequently, core needle biopsy was performed using a 16 gauge core biopsy device x2 passes. Gel-Foam slurry was administered through the introducer needle as it was removed. Limited postprocedure imaging demonstrated expected biopsy changes without large or expanding hematoma. A clean dressing was placed. The patient tolerated the procedure well without immediate complication. IMPRESSION: Successful ultrasound-guided random renal biopsy of the left renal lower pole. Electronically Signed   By: Albin Felling M.D.   On: 12/16/2020 11:51   US BIOPSY (KIDNEY)  Final Result    US Venous Img Upper Bilat (DVT)  Final Result    US Venous Img Lower Bilateral (DVT)  Final Result    DG Chest 1 View  Final Result  CT BONE MARROW BIOPSY  Final Result    US RENAL  Final Result    MR Lumbar Spine W Wo Contrast  Final Result    MR THORACIC SPINE W WO CONTRAST  Final Result    MR CERVICAL SPINE W WO CONTRAST  Final Result    MR BRAIN W WO CONTRAST  Final Result    DG Chest Port 1 View  Final Result    [place] EKG SITE RITE  Final Result    CT HEAD W & WO CONTRAST  Final Result    CT CHEST ABDOMEN PELVIS W CONTRAST  Final Result    DG FL GUIDED LUMBAR PUNCTURE  Final Result    CT  Head Wo Contrast  Final Result    DG Chest Portable 1 View  Final Result      Scheduled Meds:  amLODipine  5 mg Oral Daily   Chlorhexidine Gluconate Cloth  6 each Topical Daily   diltiazem  120 mg Oral Daily   feeding supplement  1 Container Oral TID BM   insulin aspart  0-15 Units Subcutaneous TID WC   insulin aspart  0-5 Units Subcutaneous QHS   levothyroxine  100 mcg Oral QAC breakfast   magnesium oxide  800 mg Oral BID   mouth rinse  15 mL Mouth Rinse BID   mirtazapine  15 mg Oral QHS   multivitamin with minerals  1 tablet Oral Daily   pantoprazole  40 mg Oral Daily   pneumococcal 13-valent conjugate vaccine  0.5 mL Intramuscular Tomorrow-1000   psyllium  1 packet Oral Daily   rosuvastatin  10 mg Oral QHS   sodium bicarbonate  650 mg Oral TID   sodium chloride flush  10-40 mL Intracatheter Q12H   thiamine  100 mg Oral Daily   PRN Meds: acetaminophen **OR** acetaminophen, albuterol, haloperidol lactate, loperamide, ondansetron **OR** ondansetron (ZOFRAN) IV, oxyCODONE, polyethylene glycol, sodium chloride flush Continuous Infusions:   LOS: 27 days  Time spent: Greater than 50% of the 35 minute visit was spent in counseling/coordination of care for the patient as laid out in the A&P.   Little Ishikawa, DO [facility] 12/17/2020, 7:11 AM

## 2020-12-17 NOTE — Progress Notes (Signed)
Date of Admission:  11/19/2020     ID: Tonya Myers is a 55 y.o. 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[place]) Active Problems:   Personal history of CLL (chronic lymphocytic leukemia)   DDD (degenerative disc disease), cervical   Hypothyroidism   Controlled type 2 diabetes mellitus without complication, without long-term current use of insulin ([facility])   Essential hypertension   Hyperlipidemia, mixed   Acute metabolic encephalopathy   AKI (acute kidney injury) ([place])   Hypomagnesemia   Anemia of chronic disease   Hypokalemia   Thrombocytopenia ([facility])   FUO (fever of unknown origin)   Pancytopenia ([place])   Oral thrush    Date 7/27 7/28 7/29 7/30 7/31 8/1 8/2 8/3  Tmax /- 102.6 98.8 98.9 100.4 99.5 99.9 99.5 103  antibiotic Cef,vanco,acyclovir CTX,VAN CTX/VAN CTX/Va          steroid Decadron Decadron Decadron Decadron          culture BLD-N CSF-N              Date 8/4 8/5 8/6 8/7 8/8 8/9 8/10 8/11  TMAX 98.6 99.7 102.3 101 100.7 100.6 102.1 100.2  ABX PICC     cipro Cipr doxy Cipr doxy doxy    Culture BLD-N Urine-EA                DATE 12/05/20  8/13-8/17  8/18  8/21-8/23          TMAX 101.8  Normal  100.9  102.3          ABX                  culture                  High dose solumedrol 8/12-8/14 12/12/20 1 gram of Rituximab    Subjective: Pt is doing okay Going to have permacath placed for dialysis  Medications:   amLODipine  5 mg Oral Daily   Chlorhexidine Gluconate Cloth  6 each Topical Daily   diltiazem  120 mg Oral Daily   feeding supplement  1 Container Oral TID BM   insulin aspart  0-15 Units Subcutaneous TID WC   insulin aspart  0-5 Units Subcutaneous QHS   levothyroxine  100 mcg Oral QAC breakfast   magnesium oxide  800 mg Oral BID   mouth rinse  15 mL Mouth Rinse BID   mirtazapine  15 mg Oral QHS   multivitamin with minerals  1 tablet Oral Daily   pantoprazole  40 mg Oral Daily   pneumococcal 13-valent conjugate vaccine  0.5 mL  Intramuscular Tomorrow-1000   psyllium  1 packet Oral Daily   rosuvastatin  10 mg Oral QHS   sodium bicarbonate  650 mg Oral TID   sodium chloride flush  10-40 mL Intracatheter Q12H   thiamine  100 mg Oral Daily    Objective: Vital signs in last 24 hours: Temp:  [98.8 F (37.1 C)-102.4 F (39.1 C)] 100.1 F (37.8 C) (08/24 1002) Pulse Rate:  [58-86] 86 (08/24 1002) Resp:  [12-20] 18 (08/24 1002) BP: (117-144)/(63-82) 134/72 (08/24 1002) SpO2:  [95 %-99 %] 98 % (08/24 1002)   Lab Results Recent Labs    12/16/20 0516 12/17/20 0346  WBC 7.6 6.9  HGB 6.7* 9.0*  HCT 20.2* 25.9*  NA 137 135  K 4.0 3.6  CL 100 98  CO2 30 30  BUN 43* 25*  CREATININE 4.99* 3.65*   Liver Panel  Recent Labs    12/16/20 0516 12/17/20 0346  PROT 4.6* 4.8*  ALBUMIN 2.0* 2.1*  AST 53* 70*  ALT 15 18  ALKPHOS 78 84  BILITOT 0.7 0.9   Sedimentation Rate No results for input(s): ESRSEDRATE in the last 72 hours. C-Reactive Protein No results for input(s): CRP in the last 72 hours.  Microbiology:  Studies/Results: US BIOPSY (KIDNEY)  Result Date: 12/16/2020 CLINICAL DATA:  Kidney injury INDICATION: Kidney injury EXAM: Ultrasound-guided random renal biopsy MEDICATIONS: None. ANESTHESIA/SEDATION: Moderate (conscious) sedation was employed during this procedure. A total of Versed 0.5 mg and Fentanyl 25 mcg was administered intravenously. Moderate Sedation Time: 12 minutes. The patient's level of consciousness and vital signs were monitored continuously by radiology nursing throughout the procedure under my direct supervision. FLUOROSCOPY TIME:  N/a COMPLICATIONS: None immediate. PROCEDURE: Informed written consent was obtained from the patient after a thorough discussion of the procedural risks, benefits and alternatives. All questions were addressed. Maximal Sterile Barrier Technique was utilized including caps, mask, sterile gowns, sterile gloves, sterile drape, hand hygiene and skin antiseptic. A  timeout was performed prior to the initiation of the procedure. The patient was placed supine on the exam table. Limited ultrasound of the bilateral flanks was performed to delineate anatomy. The left lower pole was selected as an appropriate site for random renal biopsy. The overlying skin was marked, prepped and draped in a standard sterile fashion. Local analgesia was obtained with 1% lidocaine. Using ultrasound guidance, a 15 gauge introducer needle was advanced into the left renal pole cortex. Subsequently, core needle biopsy was performed using a 16 gauge core biopsy device x2 passes. Gel-Foam slurry was administered through the introducer needle as it was removed. Limited postprocedure imaging demonstrated expected biopsy changes without large or expanding hematoma. A clean dressing was placed. The patient tolerated the procedure well without immediate complication. IMPRESSION: Successful ultrasound-guided random renal biopsy of the left renal lower pole. Electronically Signed   By: Albin Felling M.D.   On: 12/16/2020 11:51     Assessment/Plan: Patient presented with encephalopathy and pancytopenia on 11/19/2020.  Had fever on admission.  Lumbar puncture revealed neutrophilic pleocytosis but the cell count was only 50.  There was also increased protein in CSF.  Meningitis encephalitis PCR panel was negative and infection was ruled out.  Cytology was negative. Encephalopathy resolved.  She was also given thiamine high-dose.  She was noted to have pancreatitis incidental finding on CT.  She did not have much of abdominal pain.  Intermittent fevers.  Initially on presentation- then 8/6-8/12 - received 3 doses of 1 gram soulmedrol  8/13-8/18- no fever Fever again from 8/19>> Received 1 dose of rituximab on 12/12/20       She has been worked up for fever of unknown origin.Marland Kitchen  MRI of the brain and spine did not show any residual infection Blood cultures  from 11/27/20, 12/06/20 and 12/14/20   negative She was treated for Enterobacter aerogenes in the urine.  she did not have any pyelonephritis.  Cortisol and TSH was normal.  She was also given a trial of doxycycline for 3 days for any tickborne illness with no improvement. Likely fever from the underlying autoimmune process- had resolved with steroids and now back again No antibiotics since 12/04/20 May need to restart steroids   AKI .  Initially thought to be contrast-induced.  That the work-up revealed very low C3-C4, ANA being positive, and positive for p-ANCA, mildly elevated dsDNA and Smith antibody.. Vasculitis VS acute  lupus Received 1 gram soumedrol X 3 doses Started dialysis 1 dose of rituximab   Underwent renal biopsy on 12/16/20  Thrombocytopenia   History of CLL in remission. Because of FUO relapse was questioned and she underwent a bone marrow biopsy.  The reading was the bone marrow material was extremely limited for evaluation but appears to be trilineage hematopoiesis with relative abundance of granulocytic cells.  There was no apparent morphological evidence of a lymphoproliferative process.  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  She needed surgical intervention and also completed 6 weeks of IV antibiotic.  There is no residual infection now.   Discussed the management with care team

## 2020-12-17 NOTE — Progress Notes (Signed)
Dr. Lucky Cowboy at bedside, speaking with pt. Re: permcath procedure. Pt. Verbalized understanding of conversation.

## 2020-12-17 NOTE — Progress Notes (Addendum)
[facility]  ROUNDING NOTE   Subjective:   Patient sitting up in bed eating oatmeal Concerned about frequent fevers States diarrhea is not as frequent Patient seen later frustrated with communication  Platelets 44 Creatinine 3.65 (4.99) (4.43) (3.64) (2.82)  Tmax 102.75F   Objective:  Vital signs in last 24 hours:  Temp:  [98.8 F (37.1 C)-102.4 F (39.1 C)] 100.1 F (37.8 C) (08/24 1002) Pulse Rate:  [58-86] 86 (08/24 1002) Resp:  [12-20] 18 (08/24 1002) BP: (117-144)/(63-82) 134/72 (08/24 1002) SpO2:  [95 %-99 %] 98 % (08/24 1002)  Weight change:  Filed Weights   12/01/20 0920 12/01/20 1730 12/10/20 1345  Weight: 94.9 kg 94.4 kg 94.4 kg    Intake/Output: I/O last 3 completed shifts: In: 1380 [I.V.:3; Blood:1377] Out: 1226 [Urine:425; Other:801]   Intake/Output this shift:  No intake/output data recorded.  Physical Exam: General: NAD, sitting up in bed  Head: Normocephalic, atraumatic. Moist oral mucosal membranes  Eyes: Anicteric  Lungs:  Clear to auscultation, normal effort  Heart: Regular rate and rhythm  Abdomen:  Soft, nontender  Extremities: 1+ peripheral edema.  Neurologic: Alert, moving all four extremities  Skin: No lesions  Access RUE femoral temp cath 8/17 [place]     Basic Metabolic Panel: Recent Labs  Lab 12/13/20 0515 12/14/20 0528 12/15/20 0523 12/16/20 0516 12/17/20 0346  NA 132* 136 136 137 135  K 3.9 3.3* 4.2 4.0 3.6  CL 98 101 102 100 98  CO2 27 29 30 30 30   GLUCOSE 436* 122* 136* 119* 187*  BUN 31* 37* 40* 43* 25*  CREATININE 2.82* 3.64* 4.43* 4.99* 3.65*  CALCIUM 7.3* 7.4* 7.4* 7.4* 7.1*  MG 1.9 1.9 1.9 2.0 1.7     Liver Function Tests: Recent Labs  Lab 12/13/20 0515 12/14/20 0528 12/15/20 0523 12/16/20 0516 12/17/20 0346  AST 52* 35 43* 53* 70*  ALT 17 12 13 15 18   ALKPHOS 81 74 73 78 84  BILITOT 1.0 0.6 0.6 0.7 0.9  PROT 5.3* 5.3* 4.7* 4.6* 4.8*  ALBUMIN 2.1* 2.1* 2.0* 2.0* 2.1*    No  results for input(s): LIPASE, AMYLASE in the last 168 hours.  No results for input(s): AMMONIA in the last 168 hours.  CBC: Recent Labs  Lab 12/14/20 0528 12/14/20 1537 12/15/20 0523 12/15/20 2142 12/16/20 0516 12/17/20 0346  WBC 8.0 6.4 6.3  --  7.6 6.9  NEUTROABS 5.9 4.8 4.7  --  5.5 5.2  HGB 6.8* 7.8* 7.6*  --  6.7* 9.0*  HCT 20.3* 23.1* 22.1*  --  20.2* 25.9*  MCV 82.2 82.5 81.5  --  84.2 83.5  PLT 62* 44* 34* 65* 57* 44*     Cardiac Enzymes: No results for input(s): CKTOTAL, CKMB, CKMBINDEX, TROPONINI in the last 168 hours.   BNP: Invalid input(s): POCBNP  CBG: Recent Labs  Lab 12/16/20 0807 12/16/20 1212 12/16/20 1840 12/16/20 2153 12/17/20 0809  GLUCAP 129* 128* 149* 137* 163*     Microbiology: Results for orders placed or performed during the hospital encounter of 11/19/20  Blood culture (routine x 2)     Status: None   Collection Time: 11/19/20  4:47 PM   Specimen: BLOOD  Result Value Ref Range Status   Specimen Description BLOOD RIGHT ANTECUBITAL  Final   Special Requests   Final    BOTTLES DRAWN AEROBIC AND ANAEROBIC Blood Culture adequate volume   Culture   Final    NO GROWTH 5 DAYS Performed at [facility], [place], [place] [address]    Report Status 11/24/2020 FINAL  Final  Resp Panel by RT-PCR (Flu A&B, Covid) Nasopharyngeal Swab     Status: None   Collection Time: 11/19/20  4:48 PM   Specimen: Nasopharyngeal Swab; Nasopharyngeal(NP) swabs in vial transport medium  Result Value Ref Range Status   SARS Coronavirus 2 by RT PCR NEGATIVE NEGATIVE Final    Comment: (NOTE) SARS-CoV-2 target nucleic acids are NOT DETECTED.  The SARS-CoV-2 RNA is generally detectable in upper respiratory specimens during the acute phase of infection. The lowest concentration of SARS-CoV-2 viral copies this assay can detect is 138 copies/mL. A negative result does not preclude SARS-Cov-2 infection and should not be used as the sole  basis for treatment or other patient management decisions. A negative result may occur with  improper specimen collection/handling, submission of specimen other than nasopharyngeal swab, presence of viral mutation(s) within the areas targeted by this assay, and inadequate number of viral copies(<138 copies/mL). A negative result must be combined with clinical observations, patient history, and epidemiological information. The expected result is Negative.  Fact Sheet for Patients:  EntrepreneurPulse.com.au  Fact Sheet for Healthcare Providers:  IncredibleEmployment.be  This test is no t yet approved or cleared by the [place] FDA and  has been authorized for detection and/or diagnosis of SARS-CoV-2 by FDA under an Emergency Use Authorization (EUA). This EUA will remain  in effect (meaning this test can be used) for the duration of the COVID-19 declaration under Section 564(b)(1) of the Act, 21 U.S.C.section 360bbb-3(b)(1), unless the authorization is terminated  or revoked sooner.       Influenza A by PCR NEGATIVE NEGATIVE Final   Influenza B by PCR NEGATIVE NEGATIVE Final    Comment: (NOTE) The Xpert Xpress SARS-CoV-2/FLU/RSV plus assay is intended as an aid in the diagnosis of influenza from Nasopharyngeal swab specimens and should not be used as a sole basis for treatment. Nasal washings and aspirates are unacceptable for Xpert Xpress SARS-CoV-2/FLU/RSV testing.  Fact Sheet for Patients: EntrepreneurPulse.com.au  Fact Sheet for Healthcare Providers: IncredibleEmployment.be  This test is not yet approved or cleared by the [place] FDA and has been authorized for detection and/or diagnosis of SARS-CoV-2 by FDA under an Emergency Use Authorization (EUA). This EUA will remain in effect (meaning this test can be used) for the duration of the COVID-19 declaration under Section 564(b)(1) of the Act,  21 U.S.C. section 360bbb-3(b)(1), unless the authorization is terminated or revoked.  Performed at [facility], [address] [place], [place] [address]   Blood culture (routine x 2)     Status: None   Collection Time: 11/19/20  6:46 PM   Specimen: BLOOD  Result Value Ref Range Status   Specimen Description BLOOD RIGHT ANTECUBITAL  Final   Special Requests   Final    BOTTLES DRAWN AEROBIC AND ANAEROBIC Blood Culture adequate volume   Culture   Final    NO GROWTH 5 DAYS Performed at [facility], [address] [place], [place] [address]    Report Status 11/24/2020 FINAL  Final  Urine Culture     Status: Abnormal   Collection Time: 11/19/20  9:33 PM   Specimen: Urine, Clean Catch  Result Value Ref Range Status   Specimen Description   Final    URINE, CLEAN CATCH Performed at [facility], [address] [place], [place] [address]    Special Requests   Final  NONE Performed at [facility], [address] [place], [place] [address]    Culture (A)  Final    <10,000 COLONIES/mL INSIGNIFICANT GROWTH Performed at [facility] [address], [place], [place] [address]    Report Status 11/21/2020 FINAL  Final  Culture, fungus without smear     Status: None   Collection Time: 11/20/20 11:09 AM   Specimen: CSF; Cerebrospinal Fluid  Result Value Ref Range Status   Specimen Description   Final    CSF Performed at [facility], [address] [place], [place] [address]    Special Requests   Final    NONE Performed at [facility], [address] [place], [place] [address]    Culture   Final    NO FUNGUS ISOLATED AFTER 21 DAYS Performed at [facility], [place] [address], [place], [place] [address]    Report Status 12/11/2020 FINAL  Final  CSF culture w Gram Stain     Status: None   Collection Time: 11/20/20 11:09 AM   Specimen: PATH Cytology CSF; Cerebrospinal Fluid  Result Value Ref Range  Status   Specimen Description   Final    CSF Performed at [facility], [address], [place], [place] [address]    Special Requests   Final    NONE Performed at [facility], [address] [place], [place] [address]    Gram Stain   Final    NO ORGANISMS SEEN WBC SEEN RED BLOOD CELLS PRESENT Performed at [facility], [address] [place], [place] [address]    Culture   Final    NO GROWTH 3 DAYS Performed at [facility], [place] [address] [place],  [address]    Report Status 11/23/2020 FINAL  Final  Fungus Culture With Stain     Status: None (Preliminary result)   Collection Time: 11/20/20 11:09 AM   Specimen: PATH Cytology CSF; Cerebrospinal Fluid  Result Value Ref Range Status   Fungus Stain Final report  Final    Comment: (NOTE) Performed At: [facility] [place], [place] [id number] Rush Farmer MD [id number]    Fungus (Mycology) Culture PENDING  Incomplete   Fungal Source CSF  Final    Comment: Performed at [facility], [address] [place],  [address]  Fungus Culture Result     Status: None   Collection Time: 11/20/20 11:09 AM  Result Value Ref Range Status   Result 1 Comment  Final    Comment: (NOTE) KOH/Calcofluor preparation:  no fungus observed. Performed At: [facility] [place], [place] [id number] Rush Farmer MD YI:[id number]   Resp Panel by RT-PCR (Flu A&B, Covid) Nasopharyngeal Swab     Status: None   Collection Time: 11/27/20  1:39 PM   Specimen: Nasopharyngeal Swab; Nasopharyngeal(NP) swabs in vial transport medium  Result Value Ref Range Status   SARS Coronavirus 2 by RT PCR NEGATIVE NEGATIVE Final    Comment: (NOTE) SARS-CoV-2 target nucleic acids are NOT DETECTED.  The SARS-CoV-2 RNA is generally detectable in upper respiratory specimens during the acute phase of infection. The lowest concentration of SARS-CoV-2 viral copies  this assay can detect is 138 copies/mL. A negative result does not preclude SARS-Cov-2 infection and should not be used as the sole basis for treatment or other patient management decisions. A negative result may occur with  improper specimen collection/handling, submission of specimen other than nasopharyngeal  swab, presence of viral mutation(s) within the areas targeted by this assay, and inadequate number of viral copies(<138 copies/mL). A negative result must be combined with clinical observations, patient history, and epidemiological information. The expected result is Negative.  Fact Sheet for Patients:  EntrepreneurPulse.com.au  Fact Sheet for Healthcare Providers:  IncredibleEmployment.be  This test is no t yet approved or cleared by the [place] FDA and  has been authorized for detection and/or diagnosis of SARS-CoV-2 by FDA under an Emergency Use Authorization (EUA). This EUA will remain  in effect (meaning this test can be used) for the duration of the COVID-19 declaration under Section 564(b)(1) of the Act, 21 U.S.C.section 360bbb-3(b)(1), unless the authorization is terminated  or revoked sooner.       Influenza A by PCR NEGATIVE NEGATIVE Final   Influenza B by PCR NEGATIVE NEGATIVE Final    Comment: (NOTE) The Xpert Xpress SARS-CoV-2/FLU/RSV plus assay is intended as an aid in the diagnosis of influenza from Nasopharyngeal swab specimens and should not be used as a sole basis for treatment. Nasal washings and aspirates are unacceptable for Xpert Xpress SARS-CoV-2/FLU/RSV testing.  Fact Sheet for Patients: EntrepreneurPulse.com.au  Fact Sheet for Healthcare Providers: IncredibleEmployment.be  This test is not yet approved or cleared by the [place] FDA and has been authorized for detection and/or diagnosis of SARS-CoV-2 by FDA under an Emergency Use Authorization (EUA). This EUA  will remain in effect (meaning this test can be used) for the duration of the COVID-19 declaration under Section 564(b)(1) of the Act, 21 U.S.C. section 360bbb-3(b)(1), unless the authorization is terminated or revoked.  Performed at [facility], [address] [place], [place] [address]   Urine Culture     Status: Abnormal   Collection Time: 11/27/20  2:15 PM   Specimen: Kidney; Urine  Result Value Ref Range Status   Specimen Description   Final    URINE, CLEAN CATCH Performed at [facility], [address] [place], [place] [address]    Special Requests   Final    NONE Performed at [facility], [address] [place], [place] [address]    Culture 20,000 COLONIES/mL ENTEROBACTER AEROGENES (A)  Final   Report Status 11/30/2020 FINAL  Final   Organism ID, Bacteria ENTEROBACTER AEROGENES (A)  Final      Susceptibility   Enterobacter aerogenes - MIC*    CEFAZOLIN >=64 RESISTANT Resistant     CEFEPIME 1 SENSITIVE Sensitive     CEFTRIAXONE >=64 RESISTANT Resistant     CIPROFLOXACIN <=0.25 SENSITIVE Sensitive     GENTAMICIN <=1 SENSITIVE Sensitive     IMIPENEM <=0.25 SENSITIVE Sensitive     NITROFURANTOIN 64 INTERMEDIATE Intermediate     TRIMETH/SULFA <=20 SENSITIVE Sensitive     PIP/TAZO >=128 RESISTANT Resistant     * 20,000 COLONIES/mL ENTEROBACTER AEROGENES  CULTURE, BLOOD (ROUTINE X 2) w Reflex to ID Panel     Status: None   Collection Time: 11/27/20  8:23 PM   Specimen: BLOOD  Result Value Ref Range Status   Specimen Description BLOOD LAC  Final   Special Requests   Final    BOTTLES DRAWN AEROBIC AND ANAEROBIC Blood Culture results may not be optimal due to an inadequate volume of blood received in culture bottles   Culture   Final    NO GROWTH 5 DAYS Performed at [facility], [address] [place], [place] [address]    Report Status 12/02/2020 FINAL  Final  CULTURE,  BLOOD (ROUTINE X 2) w Reflex to ID Panel     Status:  None   Collection Time: 11/27/20  9:32 PM   Specimen: BLOOD LEFT HAND  Result Value Ref Range Status   Specimen Description BLOOD LEFT HAND  Final   Special Requests   Final    BOTTLES DRAWN AEROBIC AND ANAEROBIC Blood Culture adequate volume   Culture   Final    NO GROWTH 5 DAYS Performed at [facility], [name] [place], [place] [address]    Report Status 12/02/2020 FINAL  Final  CULTURE, BLOOD (ROUTINE X 2) w Reflex to ID Panel     Status: None   Collection Time: 12/06/20 10:35 AM   Specimen: BLOOD  Result Value Ref Range Status   Specimen Description BLOOD LT KNUCKLE  Final   Special Requests   Final    BOTTLES DRAWN AEROBIC AND ANAEROBIC Blood Culture adequate volume   Culture   Final    NO GROWTH 5 DAYS Performed at [facility], [address] [place], [place] [address]    Report Status 12/11/2020 FINAL  Final  CULTURE, BLOOD (ROUTINE X 2) w Reflex to ID Panel     Status: None   Collection Time: 12/06/20 10:35 AM   Specimen: BLOOD  Result Value Ref Range Status   Specimen Description BLOOD BLOOD LEFT WRIST  Final   Special Requests AEROBIC BOTTLE ONLY Blood Culture adequate volume  Final   Culture   Final    NO GROWTH 5 DAYS Performed at [facility], [address], [place], [place] [address]    Report Status 12/11/2020 FINAL  Final  Culture, blood (single) w Reflex to ID Panel     Status: None (Preliminary result)   Collection Time: 12/14/20  2:36 PM   Specimen: BLOOD  Result Value Ref Range Status   Specimen Description BLOOD BLOOD RIGHT HAND  Final   Special Requests   Final    BOTTLES DRAWN AEROBIC AND ANAEROBIC Blood Culture adequate volume   Culture   Final    NO GROWTH 3 DAYS Performed at [facility], [address] [place], [place] [address]    Report Status PENDING  Incomplete  Culture, blood (single) w Reflex to ID Panel     Status: None (Preliminary result)   Collection Time: 12/14/20  3:38 PM    Specimen: BLOOD  Result Value Ref Range Status   Specimen Description BLOOD A-LINE DRAW  Final   Special Requests   Final    BOTTLES DRAWN AEROBIC AND ANAEROBIC Blood Culture adequate volume   Culture   Final    NO GROWTH 3 DAYS Performed at [facility], [address] [place], [place] [address]    Report Status PENDING  Incomplete    Coagulation Studies: Recent Labs    12/16/20 0516  LABPROT 14.4  INR 1.1     Urinalysis: Recent Labs    12/15/20 1754  COLORURINE YELLOW*  LABSPEC 1.013  PHURINE 9.0*  GLUCOSEU 50*  HGBUR LARGE*  BILIRUBINUR NEGATIVE  KETONESUR NEGATIVE  PROTEINUR 100*  NITRITE NEGATIVE  LEUKOCYTESUR SMALL*       Imaging: US BIOPSY (KIDNEY)  Result Date: 12/16/2020 CLINICAL DATA:  Kidney injury INDICATION: Kidney injury EXAM: Ultrasound-guided random renal biopsy MEDICATIONS: None. ANESTHESIA/SEDATION: Moderate (conscious) sedation was employed during this procedure. A total of Versed 0.5 mg and Fentanyl 25 mcg was administered intravenously. Moderate Sedation Time: 12 minutes. The patient's level of consciousness  and vital signs were monitored continuously by radiology nursing throughout the procedure under my direct supervision. FLUOROSCOPY TIME:  N/a COMPLICATIONS: None immediate. PROCEDURE: Informed written consent was obtained from the patient after a thorough discussion of the procedural risks, benefits and alternatives. All questions were addressed. Maximal Sterile Barrier Technique was utilized including caps, mask, sterile gowns, sterile gloves, sterile drape, hand hygiene and skin antiseptic. A timeout was performed prior to the initiation of the procedure. The patient was placed supine on the exam table. Limited ultrasound of the bilateral flanks was performed to delineate anatomy. The left lower pole was selected as an appropriate site for random renal biopsy. The overlying skin was marked, prepped and draped in a standard sterile  fashion. Local analgesia was obtained with 1% lidocaine. Using ultrasound guidance, a 15 gauge introducer needle was advanced into the left renal pole cortex. Subsequently, core needle biopsy was performed using a 16 gauge core biopsy device x2 passes. Gel-Foam slurry was administered through the introducer needle as it was removed. Limited postprocedure imaging demonstrated expected biopsy changes without large or expanding hematoma. A clean dressing was placed. The patient tolerated the procedure well without immediate complication. IMPRESSION: Successful ultrasound-guided random renal biopsy of the left renal lower pole. Electronically Signed   By: Albin Felling M.D.   On: 12/16/2020 11:51     Medications:       amLODipine  5 mg Oral Daily   Chlorhexidine Gluconate Cloth  6 each Topical Daily   diltiazem  120 mg Oral Daily   feeding supplement  1 Container Oral TID BM   insulin aspart  0-15 Units Subcutaneous TID WC   insulin aspart  0-5 Units Subcutaneous QHS   levothyroxine  100 mcg Oral QAC breakfast   magnesium oxide  800 mg Oral BID   mouth rinse  15 mL Mouth Rinse BID   mirtazapine  15 mg Oral QHS   multivitamin with minerals  1 tablet Oral Daily   pantoprazole  40 mg Oral Daily   pneumococcal 13-valent conjugate vaccine  0.5 mL Intramuscular Tomorrow-1000   psyllium  1 packet Oral Daily   rosuvastatin  10 mg Oral QHS   sodium bicarbonate  650 mg Oral TID   sodium chloride flush  10-40 mL Intracatheter Q12H   thiamine  100 mg Oral Daily   acetaminophen **OR** acetaminophen, albuterol, haloperidol lactate, loperamide, ondansetron **OR** ondansetron (ZOFRAN) IV, oxyCODONE, polyethylene glycol, sodium chloride flush  Assessment/ Plan:  Ms. Albirtha Grinage is a 55 y.o. black female with diabetes mellitus type II, hypertension, hypothyroidism, CLL who was admitted to [facility] on 11/19/2020 for Altered mental status [R41.82] Encephalopathy acute [G93.40] Altered mental status,  unspecified altered mental status type [R41.82] Sepsis with encephalopathy without septic shock, due to unspecified organism ([place]) [A41.9, R65.20, G93.40]  Acute Kidney Injury with hematuria and proteinuria with baseline creatinine 0.49 on 11/22/20.  IV contrast exposure on November 21, 2020.  Renal ultrasound negative for mass and obstruction.  Serologic studies: ANA+, ENA+, antichromatin +, anti- Ro_, P-ANCA + Completed 3 days of high dose IV steroids Rituximab on 8/17. Second dose in 4 weeks.  - Appreciate IR performing renal biopsy 12/16/20. Expect preliminary results in 1-2 days. Will continue to monitor for bleeding due to decreased platelets. Treatment plan pending biopsy results - Received dialysis yesterday, UF 835m achieved. Next treatment scheduled for Thursday. - Discussed with patient the need for permcath. Reviewed benefits and risk and feel patient would benefit from permcath placement. Patient is  agreeable to proceed. Made pt NPO for possible placement this afternoon.  - Will consider outpatient clinic needs when biopsy results reviewed.  - Discussed with patient that Valcyte was discontinued due to possible cause of current illness. We will discuss wit ID about restarting at renal dosing.    Lab Results  Component Value Date   CREATININE 3.65 (H) 12/17/2020   CREATININE 4.99 (H) 12/16/2020   CREATININE 4.43 (H) 12/15/2020    Intake/Output Summary (Last 24 hours) at 12/17/2020 1031 Last data filed at 12/17/2020 1017 Gross per 24 hour  Intake 618 ml  Output 1226 ml  Net -608 ml    2. Anemia  with history of CLL and thrombocytopenia Lab Results  Component Value Date   HGB 9.0 (L) 12/17/2020   Bone marrow biopsy 12/04/20, limited marrow material with trilineage hematopoiesis. Recommending repeat bone marrow biopsy Appreciate hematology input.  Now with bleeding from catheter site.  - Numerous platelet transfusions - Appreciate vascular's evaluation and applying  additional stitches. Will continue to monitor.   3. Fever of unknown origin: not currently on antibiotics. Receiving blood products and rituximab. Most recent cultures are negative.       LOS: [place] 8/24/202210:31 AM

## 2020-12-17 NOTE — Progress Notes (Signed)
   12/17/20 1820  Assess: MEWS Score  Temp (!) 102 F (38.9 C)  BP 132/72  Pulse Rate 83  Resp 18  Level of Consciousness Alert  SpO2 100 %  O2 Device Room Air  Assess: MEWS Score  MEWS Temp 2  MEWS Systolic 0  MEWS Pulse 0  MEWS RR 0  MEWS LOC 0  MEWS Score 2  MEWS Score Color Yellow  Assess: if the MEWS score is Yellow or Red  Were vital signs taken at a resting state? Yes  Focused Assessment No change from prior assessment  Does the patient meet 2 or more of the SIRS criteria? No  Does the patient have a confirmed or suspected source of infection? Yes  Provider and Rapid Response Notified? No  MEWS guidelines implemented *See Row Information* Yes  Treat  MEWS Interventions Administered prn meds/treatments  Pain Scale 0-10  Pain Score 0  Take Vital Signs  Increase Vital Sign Frequency  Yellow: Q 2hr X 2 then Q 4hr X 2, if remains yellow, continue Q 4hrs  Escalate  MEWS: Escalate Yellow: discuss with charge nurse/RN and consider discussing with provider and RRT  Notify: Charge Nurse/RN  Name of Charge Nurse/RN Notified Siri Cole, RN  Date Charge Nurse/RN Notified 12/17/20  Time Charge Nurse/RN Notified [place]  Notify: Provider  Provider Name/Title Avon Gully  Date Provider Notified 12/17/20  Time Provider Notified [phone]  Notification Type Page  Notification Reason Change in status (yellow MEWS, elevated temp)  Assess: SIRS CRITERIA  SIRS Temperature  1  SIRS Pulse 0  SIRS Respirations  0  SIRS WBC 0  SIRS Score Sum  1

## 2020-12-17 NOTE — Progress Notes (Signed)
PT Cancellation Note  Patient Details Name: Tonya Myers MRN: [id number] DOB: 17-Oct-1965   Cancelled Treatment:    Reason Eval/Treat Not Completed: Patient at procedure or test/unavailable.  Pt currently not in her hospital room (nurse reports pt off floor to get perm-cath placed).  Will re-attempt PT session at a later date/time.  Leitha Bleak, PT 12/17/20, 3:10 PM

## 2020-12-17 NOTE — Progress Notes (Signed)
Occupational Therapy Treatment Patient Details Name: Tonya Myers MRN: [id number] DOB: 1965/10/11 Today's Date: 12/17/2020    History of present illness Pt admitted to [facility] on 11/19/20 for decreased appetite, vomiting, AMS, and lethargy. On EMS assessment, pt was found to have fever 102 and AMS with confusion and left sided weakness.  Found to have pancytopenia, oncology was consulted due to hx of CLL, but so far no concern of CLL relapse. ID also on board, initially concerning for meningitis due to hx of pneumococcal meningitis. PMH significant for: DDD, cervical radiculopathy, anxiety, DM, HTN, hypothyroidism, CLL, asthma, cLBP, vertigo, and hx of stroke.   OT comments  Pt seen for OT treatment on this date. This date, pt engaged in card memory matching game to address UE fine motor skills and cognition. Pt also engaged in UE/LE therapy exercises at bed-level (see below). This author and pt discussed POC and d/c recommendations, with pt confirming she is not interested in going to STR and confirming that she has hospital bed, wheelchair, and BSC at home. Discharge recommendation updated to reflect discussion with pt this date. Pt continues to benefit from skilled OT services to maximize independence with ADLs and minimize risk of future falls, injury, caregiver burden, and readmission. At end of session, pt stating that she is planning on bathing with RN later; pt re-educated on role of OT in acute care setting and pt stating that she would be interested in addressing bathing in future OT sessions.   Follow Up Recommendations  Home health OT;Supervision/Assistance - 24 hour    Equipment Recommendations  Other (comment) (defer to next venue of care)       Precautions / Restrictions Precautions Precautions: Fall Precaution Comments: currently has a temporary femoral trialysis catheter in right groin, avoid hip flexion Restrictions Weight Bearing Restrictions: No       Mobility Bed  Mobility               General bed mobility comments: Bed mobility not assessed at this time due to femoral access in right groin.                         ADL either performed or assessed with clinical judgement   ADL Overall ADL's : Needs assistance/impaired     Grooming: Wash/dry hands;Wash/dry face;Supervision/safety;Set up;Bed level                                        Cognition Arousal/Alertness: Awake/alert Behavior During Therapy: WFL for tasks assessed/performed Overall Cognitive Status: Within Functional Limits for tasks assessed                                 General Comments: Motivated and verbalizing preferences for goal of tx session. Requires increased time to perform tasks        Exercises General Exercises - Upper Extremity Shoulder Flexion: Strengthening;Both;10 reps;Supine;Theraband Theraband Level (Shoulder Flexion): Level 2 (Red) Elbow Flexion: Strengthening;Both;10 reps;Supine;Theraband Theraband Level (Elbow Flexion): Level 2 (Red) General Exercises - Lower Extremity Ankle Circles/Pumps: AROM;Strengthening;Both;10 reps;Supine Quad Sets: AROM;Strengthening;Both;10 reps;Supine Hip ABduction/ADduction: AAROM;Strengthening;Both;10 reps;Supine Hand Activities Cards - Deal: Right;Both;20 reps;Supine Cards - Flip: Both;20 reps;Supine Cards - Hand Shuffle: Both;15 reps;Supine Other Exercises Other Exercises: Pt engaged in card game which required pt to deal, flip, and shuffle cards, and match  cards of same value Other Exercises: Discussion regarding POC and d/c recommendations           Pertinent Vitals/ Pain       Pain Assessment: No/denies pain         Frequency  Min 2X/week        Progress Toward Goals  OT Goals(current goals can now be found in the care plan section)  Progress towards OT goals: Progressing toward goals  Acute Rehab OT Goals Patient Stated Goal: to have less stiffness  and get stronger OT Goal Formulation: With patient/family Time For Goal Achievement: 12/23/20 Potential to Achieve Goals: Good  Plan Discharge plan needs to be updated;Frequency remains appropriate       AM-PAC OT "6 Clicks" Daily Activity     Outcome Measure   Help from another person eating meals?: A Little Help from another person taking care of personal grooming?: A Little Help from another person toileting, which includes using toliet, bedpan, or urinal?: A Lot Help from another person bathing (including washing, rinsing, drying)?: A Lot Help from another person to put on and taking off regular upper body clothing?: A Little Help from another person to put on and taking off regular lower body clothing?: A Lot 6 Click Score: 15    End of Session    OT Visit Diagnosis: Muscle weakness (generalized) (M62.81);Other symptoms and signs involving cognitive function   Activity Tolerance Patient tolerated treatment well   Patient Left in bed;with call bell/phone within reach;with bed alarm set;with family/visitor present   Nurse Communication Mobility status        Time: 5638-9373 OT Time Calculation (min): 30 min  Charges: OT General Charges $OT Visit: 1 Visit OT Treatments $Therapeutic Activity: 23-37 mins  Fredirick Maudlin, OTR/L [place]

## 2020-12-17 NOTE — Progress Notes (Signed)
Patient arrived back to the floor from having permcath placed to right upper chest. Gauze clean, dry and intact secured with tegaderm. Patient has no c/o pain. VS obtained, elevated temp of 101.1, PRN Tylenol given. CBG obtained and SSI given. Purewick in place, will continue to monitor.

## 2020-12-17 NOTE — Progress Notes (Signed)
Contacted by patients Aunt Jerolyn Center. [phone]. She inquired about transferring patient to facility with rheumatologist. Contacted Dr. Avon Gully. He explained they would not accept her without the biology results(1 week) and if she needed plasmapheresis. He stated he had contacted [facility] and would transfer if plasmapheresis was needed but would not know until results of biopsy was known. Spoke with patient's spouse and he said I could release the information to the Aunt. Called her back and gave her the information.She said she just wanted to make sure she was on the wait list. I explained [facility] had been contacted and would not accept her until the biopsy was back and if she needed plasmapheresis. Explained estimated time of results would be in 1 week.

## 2020-12-17 NOTE — Progress Notes (Signed)
This nurse entered room to talk to patient about procedure and obtain consent if patient was agreeable. When patient was informed that the procedure would be today, she stated that she had already eaten and drank today and that she felt "like a science experiment." Adamantly refused to sign consent, family at bedside agree with patient. Charge nurse, Siri Cole, made aware. Will continue to monitor.

## 2020-12-17 NOTE — Op Note (Signed)
OPERATIVE NOTE    PRE-OPERATIVE DIAGNOSIS: 1. ESRD   POST-OPERATIVE DIAGNOSIS: same as above  PROCEDURE: Ultrasound guidance for vascular access to the right internal jugular vein Fluoroscopic guidance for placement of catheter Placement of a 19 cm tip to cuff tunneled hemodialysis catheter via the right internal jugular vein  SURGEON: Leotis Pain, MD  ANESTHESIA:  Local with Moderate conscious sedation for approximately 19 minutes using 2 mg of Versed and 50 mcg of Fentanyl  ESTIMATED BLOOD LOSS: 5 cc  FLUORO TIME: less than one minute  CONTRAST: none  FINDING(S): 1.  Patent right internal jugular vein  SPECIMEN(S):  None  INDICATIONS:   Tonya Myers is a 55 y.o.female who presents with renal failure.  The patient needs long term dialysis access for their ESRD, and a Permcath is necessary.  Risks and benefits are discussed and informed consent is obtained.    DESCRIPTION: After obtaining full informed written consent, the patient was brought back to the vascular suited. The patient's right neck and chest were sterilely prepped and draped in a sterile surgical field was created. Moderate conscious sedation was administered during a face to face encounter with the patient throughout the procedure with my supervision of the RN administering medicines and monitoring the patient's vital signs, pulse oximetry, telemetry and mental status throughout from the start of the procedure until the patient was taken to the recovery room.  The right internal jugular vein was visualized with ultrasound and found to be patent. It was then accessed under direct ultrasound guidance and a permanent image was recorded. A wire was placed. After skin nick and dilatation, the peel-away sheath was placed over the wire. I then turned my attention to an area under the clavicle. Approximately 1-2 fingerbreadths below the clavicle a small counterincision was created and tunneled from the subclavicular incision  to the access site. Using fluoroscopic guidance, a 19 centimeter tip to cuff tunneled hemodialysis catheter was selected, and tunneled from the subclavicular incision to the access site. It was then placed through the peel-away sheath and the peel-away sheath was removed. Using fluoroscopic guidance the catheter tips were parked in the right atrium. The appropriate distal connectors were placed. It withdrew blood well and flushed easily with heparinized saline and a concentrated heparin solution was then placed. It was secured to the chest wall with 2 Prolene sutures. The access incision was closed single 4-0 Monocryl. A 4-0 Monocryl pursestring suture was placed around the exit site. Sterile dressings were placed. The patient tolerated the procedure well and was taken to the recovery room in stable condition.  COMPLICATIONS: None  CONDITION: Stable  Leotis Pain, MD 12/17/2020 4:18 PM   This note was created with Dragon Medical transcription system. Any errors in dictation are purely unintentional.

## 2020-12-18 ENCOUNTER — Encounter: Payer: Self-pay | Admitting: Vascular Surgery

## 2020-12-18 DIAGNOSIS — D8989 Other specified disorders involving the immune mechanism, not elsewhere classified: Secondary | ICD-10-CM | POA: Diagnosis not present

## 2020-12-18 DIAGNOSIS — R571 Hypovolemic shock: Secondary | ICD-10-CM

## 2020-12-18 LAB — CBC WITH DIFFERENTIAL/PLATELET
Abs Immature Granulocytes: 0.02 10*3/uL (ref 0.00–0.07)
Basophils Absolute: 0 10*3/uL (ref 0.0–0.1)
Basophils Relative: 0 %
Eosinophils Absolute: 0 10*3/uL (ref 0.0–0.5)
Eosinophils Relative: 0 %
HCT: 25.4 % — ABNORMAL LOW (ref 36.0–46.0)
Hemoglobin: 8.9 g/dL — ABNORMAL LOW (ref 12.0–15.0)
Immature Granulocytes: 0 %
Lymphocytes Relative: 30 %
Lymphs Abs: 1.5 10*3/uL (ref 0.7–4.0)
MCH: 29.6 pg (ref 26.0–34.0)
MCHC: 35 g/dL (ref 30.0–36.0)
MCV: 84.4 fL (ref 80.0–100.0)
Monocytes Absolute: 0.2 10*3/uL (ref 0.1–1.0)
Monocytes Relative: 3 %
Neutro Abs: 3.3 10*3/uL (ref 1.7–7.7)
Neutrophils Relative %: 67 %
Platelets: 37 10*3/uL — ABNORMAL LOW (ref 150–400)
RBC: 3.01 MIL/uL — ABNORMAL LOW (ref 3.87–5.11)
RDW: 15.9 % — ABNORMAL HIGH (ref 11.5–15.5)
Smear Review: NORMAL
WBC: 5 10*3/uL (ref 4.0–10.5)
nRBC: 0 % (ref 0.0–0.2)

## 2020-12-18 LAB — MAGNESIUM: Magnesium: 1.7 mg/dL (ref 1.7–2.4)

## 2020-12-18 LAB — COMPREHENSIVE METABOLIC PANEL
ALT: 10 U/L (ref 0–44)
AST: 57 U/L — ABNORMAL HIGH (ref 15–41)
Albumin: 2.1 g/dL — ABNORMAL LOW (ref 3.5–5.0)
Alkaline Phosphatase: 84 U/L (ref 38–126)
Anion gap: 9 (ref 5–15)
BUN: 30 mg/dL — ABNORMAL HIGH (ref 6–20)
CO2: 30 mmol/L (ref 22–32)
Calcium: 7.2 mg/dL — ABNORMAL LOW (ref 8.9–10.3)
Chloride: 97 mmol/L — ABNORMAL LOW (ref 98–111)
Creatinine, Ser: 4.35 mg/dL — ABNORMAL HIGH (ref 0.44–1.00)
GFR, Estimated: 11 mL/min — ABNORMAL LOW (ref >60)
Glucose, Bld: 136 mg/dL — ABNORMAL HIGH (ref 70–99)
Potassium: 3.6 mmol/L (ref 3.5–5.1)
Sodium: 136 mmol/L (ref 135–145)
Total Bilirubin: 0.9 mg/dL (ref 0.3–1.2)
Total Protein: 5.1 g/dL — ABNORMAL LOW (ref 6.5–8.1)

## 2020-12-18 LAB — CRYOGLOBULIN

## 2020-12-18 LAB — GLUCOSE, CAPILLARY
Glucose-Capillary: 125 mg/dL — ABNORMAL HIGH (ref 70–99)
Glucose-Capillary: 317 mg/dL — ABNORMAL HIGH (ref 70–99)
Glucose-Capillary: 159 mg/dL — ABNORMAL HIGH (ref 70–99)
Glucose-Capillary: 156 mg/dL — ABNORMAL HIGH (ref 70–99)

## 2020-12-18 LAB — HAPTOGLOBIN: Haptoglobin: <10 mg/dL — ABNORMAL LOW (ref 33–346)

## 2020-12-18 MED ORDER — SODIUM CHLORIDE 0.9 % IV BOLUS
500.0000 mL | Freq: Once | INTRAVENOUS | Status: AC
  Administered 2020-12-18: 500 mL via INTRAVENOUS

## 2020-12-18 MED ORDER — HYDROCORTISONE NA SUCCINATE PF 100 MG IJ SOLR
100.0000 mg | Freq: Once | INTRAMUSCULAR | Status: AC
  Administered 2020-12-19: 100 mg via INTRAVENOUS
  Filled 2020-12-18: qty 2

## 2020-12-18 MED ORDER — ACETAMINOPHEN 325 MG PO TABS
ORAL_TABLET | ORAL | Status: AC
  Filled 2020-12-18: qty 2

## 2020-12-18 MED ORDER — ACETAMINOPHEN 500 MG PO TABS
1000.0000 mg | ORAL_TABLET | ORAL | Status: AC
  Administered 2020-12-18: 1000 mg via ORAL
  Filled 2020-12-18: qty 2

## 2020-12-18 MED ORDER — SODIUM CHLORIDE 0.9 % IV BOLUS
250.0000 mL | Freq: Once | INTRAVENOUS | Status: AC
  Administered 2020-12-18: 250 mL via INTRAVENOUS

## 2020-12-18 NOTE — Progress Notes (Signed)
   12/16/20 1447 12/16/20 1500 12/16/20 1515  Vitals  Temp 99 F (37.2 C)  --   --   Temp Source  --   --   --   BP 126/74 126/74 131/75  MAP (mmHg)  --  87 91  Pulse Rate  --  78 80  ECG Heart Rate 80 79 80  Resp 20 18 12   During Hemodialysis Assessment  Blood Flow Rate (mL/min) 300 mL/min 300 mL/min 300 mL/min  Arterial Pressure (mmHg) -110 mmHg -110 mmHg -110 mmHg  Venous Pressure (mmHg) 100 mmHg 100 mmHg 100 mmHg  Transmembrane Pressure (mmHg) 60 mmHg 60 mmHg 60 mmHg  Ultrafiltration Rate (mL/min) 500 mL/min 500 mL/min 500 mL/min  Dialysate Flow Rate (mL/min) 500 ml/min 500 ml/min 500 ml/min  Conductivity: Machine  13.8 13.8 13.8  HD Safety Checks Performed Yes Yes  --   Dialysis Fluid Bolus Normal Saline  --   --   Bolus Amount (mL) 250 mL  --   --   Intra-Hemodialysis Comments Tx initiated Progressing as prescribed Progressing as prescribed    12/16/20 1530 12/16/20 1545 12/16/20 1556  Vitals  Temp  --   --  99.2 F (37.3 C)  Temp Source  --   --  Oral  BP 131/77 134/79  --   MAP (mmHg) 91 94  --   Pulse Rate 78 81  --   ECG Heart Rate 77 85  --   Resp 19 20  --   During Hemodialysis Assessment  Blood Flow Rate (mL/min) 300 mL/min 300 mL/min  --   Arterial Pressure (mmHg) -110 mmHg -110 mmHg  --   Venous Pressure (mmHg) 100 mmHg 100 mmHg  --   Transmembrane Pressure (mmHg) 60 mmHg 60 mmHg  --   Ultrafiltration Rate (mL/min) 500 mL/min 500 mL/min  --   Dialysate Flow Rate (mL/min) 500 ml/min 500 ml/min  --   Conductivity: Machine  13.8 13.8  --   HD Safety Checks Performed  --   --   --   Dialysis Fluid Bolus  --   --   --   Bolus Amount (mL)  --   --   --   Intra-Hemodialysis Comments Progressing as prescribed Progressing as prescribed  --     12/16/20 1600  Vitals  Temp  --   Temp Source  --   BP 134/82  MAP (mmHg) 96  Pulse Rate 77  ECG Heart Rate 77  Resp 19  During Hemodialysis Assessment  Blood Flow Rate (mL/min) 300 mL/min  Arterial Pressure (mmHg)  -110 mmHg  Venous Pressure (mmHg) 100 mmHg  Transmembrane Pressure (mmHg) 60 mmHg  Ultrafiltration Rate (mL/min) 500 mL/min  Dialysate Flow Rate (mL/min) 500 ml/min  Conductivity: Machine  13.8  HD Safety Checks Performed  --   Dialysis Fluid Bolus  --   Bolus Amount (mL)  --   Intra-Hemodialysis Comments Progressing as prescribed

## 2020-12-18 NOTE — Progress Notes (Signed)
Tolerated HD tx via R permacath for 3 hrs.  UF goal of  achieved. Report given to primary RN

## 2020-12-18 NOTE — Progress Notes (Signed)
   12/11/20 1130 12/11/20 1145 12/11/20 1153  Vitals  BP 137/73 (!) 142/71  --   MAP (mmHg) 92 91  --   Pulse Rate (!) 58 61 (!) 125  ECG Heart Rate 60 (!) 57 62  Resp 18 18 17   During Hemodialysis Assessment  Blood Flow Rate (mL/min) 250 mL/min 250 mL/min  --   Arterial Pressure (mmHg) -90 mmHg -90 mmHg  --   Venous Pressure (mmHg) 90 mmHg 90 mmHg  --   Transmembrane Pressure (mmHg) 60 mmHg 60 mmHg  --   Ultrafiltration Rate (mL/min) 200 mL/min 200 mL/min  --   Dialysate Flow Rate (mL/min) 500 ml/min 500 ml/min  --   Conductivity: Machine  13.7 13.7  --   HD Safety Checks Performed Yes Yes  --   Intra-Hemodialysis Comments Progressing as prescribed Progressing as prescribed  --     12/11/20 1200  Vitals  BP 140/71  MAP (mmHg) 92  Pulse Rate 60  ECG Heart Rate 63  Resp 16  During Hemodialysis Assessment  Blood Flow Rate (mL/min)  --   Arterial Pressure (mmHg)  --   Venous Pressure (mmHg)  --   Transmembrane Pressure (mmHg)  --   Ultrafiltration Rate (mL/min)  --   Dialysate Flow Rate (mL/min)  --   Conductivity: Machine   --   HD Safety Checks Performed  --   Intra-Hemodialysis Comments Tx completed

## 2020-12-18 NOTE — Progress Notes (Signed)
Progress Note    Tonya Myers   UJW:[id number]  DOB: 1965-12-26  DOA: 11/19/2020     28  PCP: Tonya Pink, MD  Initial CC: AMS  Hospital Course: [name] is a 55 y.o. female with medical history significant for recent pneumococcal meningitis and bacteremia, depression, hypothyroid, IDDM2 who presented to the ED with AMS. She underwent extensive work-up after admission with multiple subspecialty consults. Infectious work-up with ID is negative and ID has signed off on 12/09/2020 (see FUO workup below). She also underwent further work-up with nephrology, oncology, and rheumatology. She has multiple serologies concerning for possible lupus and trying to now obtain renal biopsy to help further diagnose.  Thrombocytopenia has been preventing biopsy obtainment. Due to worsening renal function, she was started on HD.   Interval History:  No acute issues or events overnight denies nausea vomiting diarrhea constipation headache fevers chills or chest pain  Assessment & Plan:  Unspecified autoimmune disorder ([place]) - Serologic work-up indicative of underlying autoimmune process; concern is Lupus vs vasculitis vs RPGN  - s/p 3 days steroids (8/12 - 8/14, solumedrol 1g daily) - oncology, nephrology, and rheumatology following - also started on Rituxan on 8/19 - Underwent left renal biopsy on 12/16/20.  Results pending -Case was previously discussed with [facility].  If she does warrant plasmapheresis she would then be accepted for transfer but until then had no indication.  Will need to call back [facility] to discuss transfer if and when indicated.  Oral thrush Resolved --s/p Diflucan for 3 doses  Pancytopenia ([place]) History of CLL  Oncology consulted Patient's CLL has been in remission since 2016 Recurrence of hematologic note malignancy felt unlikely --s/p 1u PRBC 8/5  - s/p 1u PRBC 8/10 - s/p 1u PRBC 8/18 - s/p 1u PRBC 8/21 - s/p 2u PRBC 8/23  FUO (fever of unknown origin) - Likely  autoimmune in etiology - ID has followed initially and signed off on 8/16 due to lack of any infectious process -reconsulted on 823 with negative procalcitonin again likely autoimmune - Fevers initially resolved with steroids, now recurrent fevers daily after steroid cessation - Follow up UA/culture and blood cultures   Thrombocytopenia ([place]) - also see pancytopenia below - PLTC down to 21k on 8/17; no overt bleeding but in efforts to facilitate renal bx will transfuse 2 packs platelets  - PLTC only up to 32k on 8/18; some oozing as expected around trialysis cath; repeat 1 unit Platelet on 8/18 - still low on 8/20; [id number] ordered per nephro on 8/20 - s/p 2 units Platelets on 8/20 - s/p more platelets on 8/22 and 8/23  Hypokalemia/Hypomagnesemia - Replete and recheck as needed - WNL  AKI (acute kidney injury) ([place]) - baseline creat ~0.7 and renal function has continued to deteriorate - nephrology following  -Renal biopsy 12/16/2020 results pending - s/p femoral trialysis on 8/17; started 1st HD session on 8/17 as well -transition to permacath 12/17/2020 - continue HD per nephrology  Acute metabolic encephalopathy, improving - Likely autoimmune in etiology  --meningitis ruled out by LP.  CT head neg for new stroke.  Hematology ruled out CLL as the culprit.  No seizure on EEG. --mental status dramatically improved with treatment of pancreatitis and IV thiamine repletion. --MRI brain, no acute finding -- Completed high-dose IV thiamine x3 days -awaiting biopsy results before reinitiation of steroids  Hyperlipidemia, mixed - Continue statin  Essential hypertension - Continue Cardizem and hydralazine  Controlled type 2 diabetes mellitus without complication, without long-term current use  of insulin ([place]) - A1c 6.5% on 10/02/20 -Transiently elevated with steroids, improving   -Continue sliding scale insulin, hypoglycemic protocol  Hypothyroidism - Continue Synthroid  Personal  history of CLL (chronic lymphocytic leukemia) - in remission since 2016 per oncology  - no relapse seen on peripheral smear performed on 11/25/20 "Unremarkable WBC count with unremarkable WBC morphology. Thrombocytopenia with unremarkable platelet morphology. Findings do not support CLL relapse." - no findings on BM biopsy on 8/11 but repeat biopsy was recommended as well: "Trilineage hematopoiesis (limited material)"  Pancreatitis, POA-resolved as of 12/11/2020 Unclear etiology -no alcohol use, questionably drug-induced but imaging and symptoms negative for gallstone or obstructive etiology, triglycerides within normal limits  DVT prophylaxis: Place TED hose Start: 11/19/20 2301   Code Status:   Code Status: Full Code Family Communication: Husband at bedside  Disposition Plan: Status is: Inpatient  Remains inpatient appropriate because:Inpatient level of care appropriate due to severity of illness  Dispo: The patient is from: Home              Anticipated d/c is to: Home              Patient currently is not medically stable to d/c.   Difficult to place patient No  Risk of unplanned readmission score: Unplanned Admission- Pilot do not use: 52.23   Objective: Blood pressure 131/79, pulse 89, temperature (!) 100.4 F (38 C), resp. rate 18, height 6' (1.829 m), weight 94.4 kg, SpO2 98 %.   Examination: General appearance: alert, cooperative, and no distress Head: Normocephalic atraumatic Mouth: no thrush noted; Torus Palatinus noted  Eyes:  EOMI Lungs: clear to auscultation bilaterally Heart: regular rate and rhythm and S1, S2 normal Abdomen: normal findings: bowel sounds normal and soft, non-tender Extremities:  no LE edema; swollen joints noted in ankle, wrist; some edema in fingers Skin:  excess skin noted due to weight loss Neurologic: no focal deficits   Consultants:  Oncology/hematology Rheumatology Nephrology ID  Procedures:  Renal biopsy 12/16/2020  Data Reviewed:  I have personally reviewed following labs and imaging studies Results for orders placed or performed during the hospital encounter of 11/19/20 (from the past 24 hour(s))  Glucose, capillary     Status: Abnormal   Collection Time: 12/17/20 12:11 PM  Result Value Ref Range   Glucose-Capillary 208 (H) 70 - 99 mg/dL  Glucose, capillary     Status: Abnormal   Collection Time: 12/17/20  2:36 PM  Result Value Ref Range   Glucose-Capillary 186 (H) 70 - 99 mg/dL  Glucose, capillary     Status: Abnormal   Collection Time: 12/17/20  4:49 PM  Result Value Ref Range   Glucose-Capillary 144 (H) 70 - 99 mg/dL  Glucose, capillary     Status: Abnormal   Collection Time: 12/17/20  8:46 PM  Result Value Ref Range   Glucose-Capillary 248 (H) 70 - 99 mg/dL  CBC with Differential/Platelet     Status: Abnormal   Collection Time: 12/18/20  5:05 AM  Result Value Ref Range   WBC 5.0 4.0 - 10.5 K/uL   RBC 3.01 (L) 3.87 - 5.11 MIL/uL   Hemoglobin 8.9 (L) 12.0 - 15.0 g/dL   HCT 25.4 (L) 36.0 - 46.0 %   MCV 84.4 80.0 - 100.0 fL   MCH 29.6 26.0 - 34.0 pg   MCHC 35.0 30.0 - 36.0 g/dL   RDW 15.9 (H) 11.5 - 15.5 %   Platelets 37 (L) 150 - 400 K/uL   nRBC 0.0  0.0 - 0.2 %   Neutrophils Relative % 67 %   Neutro Abs 3.3 1.7 - 7.7 K/uL   Lymphocytes Relative 30 %   Lymphs Abs 1.5 0.7 - 4.0 K/uL   Monocytes Relative 3 %   Monocytes Absolute 0.2 0.1 - 1.0 K/uL   Eosinophils Relative 0 %   Eosinophils Absolute 0.0 0.0 - 0.5 K/uL   Basophils Relative 0 %   Basophils Absolute 0.0 0.0 - 0.1 K/uL   WBC Morphology MORPHOLOGY UNREMARKABLE    RBC Morphology MORPHOLOGY UNREMARKABLE    Smear Review Normal platelet morphology    Immature Granulocytes 0 %   Abs Immature Granulocytes 0.02 0.00 - 0.07 K/uL  Comprehensive metabolic panel     Status: Abnormal   Collection Time: 12/18/20  5:05 AM  Result Value Ref Range   Sodium 136 135 - 145 mmol/L   Potassium 3.6 3.5 - 5.1 mmol/L   Chloride 97 (L) 98 - 111 mmol/L   CO2  30 22 - 32 mmol/L   Glucose, Bld 136 (H) 70 - 99 mg/dL   BUN 30 (H) 6 - 20 mg/dL   Creatinine, Ser 4.35 (H) 0.44 - 1.00 mg/dL   Calcium 7.2 (L) 8.9 - 10.3 mg/dL   Total Protein 5.1 (L) 6.5 - 8.1 g/dL   Albumin 2.1 (L) 3.5 - 5.0 g/dL   AST 57 (H) 15 - 41 U/L   ALT 10 0 - 44 U/L   Alkaline Phosphatase 84 38 - 126 U/L   Total Bilirubin 0.9 0.3 - 1.2 mg/dL   GFR, Estimated 11 (L) >60 mL/min   Anion gap 9 5 - 15  Magnesium     Status: None   Collection Time: 12/18/20  5:05 AM  Result Value Ref Range   Magnesium 1.7 1.7 - 2.4 mg/dL  Glucose, capillary     Status: Abnormal   Collection Time: 12/18/20  7:32 AM  Result Value Ref Range   Glucose-Capillary 125 (H) 70 - 99 mg/dL  Glucose, capillary     Status: Abnormal   Collection Time: 12/18/20 11:40 AM  Result Value Ref Range   Glucose-Capillary 317 (H) 70 - 99 mg/dL    Recent Results (from the past 240 hour(s))  Culture, blood (single) w Reflex to ID Panel     Status: None (Preliminary result)   Collection Time: 12/14/20  2:36 PM   Specimen: BLOOD  Result Value Ref Range Status   Specimen Description BLOOD BLOOD RIGHT HAND  Final   Special Requests   Final    BOTTLES DRAWN AEROBIC AND ANAEROBIC Blood Culture adequate volume   Culture   Final    NO GROWTH 4 DAYS Performed at [facility], [address] [place], [place] [address]    Report Status PENDING  Incomplete  Culture, blood (single) w Reflex to ID Panel     Status: None (Preliminary result)   Collection Time: 12/14/20  3:38 PM   Specimen: BLOOD  Result Value Ref Range Status   Specimen Description BLOOD A-LINE DRAW  Final   Special Requests   Final    BOTTLES DRAWN AEROBIC AND ANAEROBIC Blood Culture adequate volume   Culture   Final    NO GROWTH 4 DAYS Performed at [facility], [address] [place], [place] [address]    Report Status PENDING  Incomplete     Radiology Studies: PERIPHERAL VASCULAR CATHETERIZATION  Result Date:  12/17/2020 See surgical note for result.  US BIOPSY (KIDNEY)  Final Result    US Venous Img Upper Bilat (DVT)  Final Result    US Venous Img Lower Bilateral (DVT)  Final Result    DG Chest 1 View  Final Result    CT BONE MARROW BIOPSY  Final Result    US RENAL  Final Result    MR Lumbar Spine W Wo Contrast  Final Result    MR THORACIC SPINE W WO CONTRAST  Final Result    MR CERVICAL SPINE W WO CONTRAST  Final Result    MR BRAIN W WO CONTRAST  Final Result    DG Chest Port 1 View  Final Result    [place] EKG SITE RITE  Final Result    CT HEAD W & WO CONTRAST  Final Result    CT CHEST ABDOMEN PELVIS W CONTRAST  Final Result    DG FL GUIDED LUMBAR PUNCTURE  Final Result    CT Head Wo Contrast  Final Result    DG Chest Portable 1 View  Final Result      Scheduled Meds:  amLODipine  5 mg Oral Daily   Chlorhexidine Gluconate Cloth  6 each Topical Daily   diltiazem  120 mg Oral Daily   feeding supplement  1 Container Oral TID BM   insulin aspart  0-15 Units Subcutaneous TID WC   insulin aspart  0-5 Units Subcutaneous QHS   levothyroxine  100 mcg Oral QAC breakfast   magnesium oxide  800 mg Oral BID   mouth rinse  15 mL Mouth Rinse BID   mirtazapine  15 mg Oral QHS   multivitamin with minerals  1 tablet Oral Daily   pantoprazole  40 mg Oral Daily   pneumococcal 13-valent conjugate vaccine  0.5 mL Intramuscular Tomorrow-1000   psyllium  1 packet Oral Daily   rosuvastatin  10 mg Oral QHS   sodium bicarbonate  650 mg Oral TID   sodium chloride flush  10-40 mL Intracatheter Q12H   thiamine  100 mg Oral Daily   valACYclovir  500 mg Oral Daily   PRN Meds: acetaminophen **OR** acetaminophen, albuterol, haloperidol lactate, HYDROmorphone (DILAUDID) injection, loperamide, ondansetron **OR** ondansetron (ZOFRAN) IV, ondansetron (ZOFRAN) IV, oxyCODONE, polyethylene glycol, sodium chloride flush Continuous Infusions:   LOS: 28 days  Time spent: Greater than 50%  of the 35 minute visit was spent in counseling/coordination of care for the patient as laid out in the A&P.   Little Ishikawa, DO [facility] 12/18/2020, 12:08 PM

## 2020-12-18 NOTE — Progress Notes (Signed)
12/12/20 0814 12/12/20 0815 12/12/20 0830  Vitals  Temp (!) 100.5 F (38.1 C)  --   --   Temp Source Oral  --   --   BP (!) 145/76 (!) 145/72 138/75  MAP (mmHg)  --  93 94  BP Location Left Arm Left Arm  --   BP Method Automatic Automatic  --   Patient Position (if appropriate) Lying Lying  --   ECG Heart Rate  --  78 74  Resp 18 15 14   During Hemodialysis Assessment  Blood Flow Rate (mL/min) 300 mL/min 300 mL/min 300 mL/min  Arterial Pressure (mmHg) -130 mmHg -130 mmHg -130 mmHg  Venous Pressure (mmHg) 80 mmHg 80 mmHg 90 mmHg  Transmembrane Pressure (mmHg) 60 mmHg 60 mmHg 60 mmHg  Ultrafiltration Rate (mL/min) 510 mL/min 510 mL/min 510 mL/min  Dialysate Flow Rate (mL/min) 500 ml/min 500 ml/min 500 ml/min  Conductivity: Machine  13.8 13.7 13.9  HD Safety Checks Performed Yes Yes Yes  Intra-Hemodialysis Comments Tx initiated Progressing as prescribed Progressing as prescribed    12/12/20 0845 12/12/20 0900 12/12/20 0915  Vitals  Temp  --   --   --   Temp Source  --   --   --   BP 136/77 140/82 138/86  MAP (mmHg) 93 98 99  BP Location  --   --   --   BP Method  --   --   --   Patient Position (if appropriate)  --   --   --   ECG Heart Rate 76 78 82  Resp 17 13 16   During Hemodialysis Assessment  Blood Flow Rate (mL/min) 300 mL/min 300 mL/min 300 mL/min  Arterial Pressure (mmHg) -130 mmHg -130 mmHg -130 mmHg  Venous Pressure (mmHg) 90 mmHg 90 mmHg 90 mmHg  Transmembrane Pressure (mmHg) 60 mmHg 60 mmHg 60 mmHg  Ultrafiltration Rate (mL/min) 510 mL/min 510 mL/min 510 mL/min  Dialysate Flow Rate (mL/min) 500 ml/min 500 ml/min 500 ml/min  Conductivity: Machine  13.9 13.8 13.8  HD Safety Checks Performed Yes Yes Yes  Intra-Hemodialysis Comments Progressing as prescribed Progressing as prescribed Progressing as prescribed    12/12/20 0930 12/12/20 0945 12/12/20 1000  Vitals  Temp  --   --   --   Temp Source  --   --   --   BP 125/73 127/67 125/69  MAP (mmHg) 88 82 85  BP  Location  --   --   --   BP Method  --   --   --   Patient Position (if appropriate)  --   --   --   ECG Heart Rate 79 79 83  Resp 14 20 14   During Hemodialysis Assessment  Blood Flow Rate (mL/min) 300 mL/min 300 mL/min 300 mL/min  Arterial Pressure (mmHg) -130 mmHg -130 mmHg -130 mmHg  Venous Pressure (mmHg) 90 mmHg 90 mmHg 90 mmHg  Transmembrane Pressure (mmHg) 60 mmHg 60 mmHg 60 mmHg  Ultrafiltration Rate (mL/min) 510 mL/min 510 mL/min 510 mL/min  Dialysate Flow Rate (mL/min) 500 ml/min 500 ml/min 500 ml/min  Conductivity: Machine  13.8 13.8 13.8  HD Safety Checks Performed Yes Yes Yes  Intra-Hemodialysis Comments Progressing as prescribed Progressing as prescribed Progressing as prescribed    12/12/20 1015  Vitals  Temp  --   Temp Source  --   BP 123/67  MAP (mmHg) 82  BP Location  --   BP Method  --   Patient Position (if appropriate)  --  ECG Heart Rate 84  Resp 19  During Hemodialysis Assessment  Blood Flow Rate (mL/min) 300 mL/min  Arterial Pressure (mmHg) -130 mmHg  Venous Pressure (mmHg) 90 mmHg  Transmembrane Pressure (mmHg) 60 mmHg  Ultrafiltration Rate (mL/min) 510 mL/min  Dialysate Flow Rate (mL/min) 500 ml/min  Conductivity: Machine  13.8  HD Safety Checks Performed Yes  Intra-Hemodialysis Comments Tx completed

## 2020-12-18 NOTE — TOC Progression Note (Signed)
Transition of Care [facility]) - Progression Note    Patient Details  Name: Justine Dines MRN: [id number] Date of Birth: 10-25-1965  Transition of Care [facility]) [name] Contact  Shelbie Hutching, RN Phone Number: 12/18/2020, 2:31 PM  Clinical Narrative:    Patient needs to sit for dialysis, plan for that today.  Elvera Bicker is working on dialysis placement outpatient.  Husband wants to take patient home and patient wants to go home at DC.  [facility] will provide home health services at discharge.     Expected Discharge Plan: [place] Barriers to Discharge: Continued Medical Work up  Expected Discharge Plan and Services Expected Discharge Plan: [place]   Discharge Planning Services: CM Consult Post Acute Care Choice: [place], [name] Living arrangements for the past 2 months: Single Family Home                 DME Arranged: N/A DME Agency: NA       HH Arranged: PT, OT [place] Agency: [place] ([facility]) Date HH Agency Contacted: 11/24/20 Time [place]: 1419 Representative spoke with at [place]: [place] ([place]) Interventions    Readmission Risk Interventions Readmission Risk Prevention Plan 11/24/2020  Transportation Screening Complete  PCP or Specialist Appt within 3-5 Days Complete  HRI or [place] Complete  Social Work Consult for [place] Planning/Counseling Complete  Palliative Care Screening Not Applicable  Medication Review Press [employer]) Complete  Some recent data might be hidden

## 2020-12-18 NOTE — Progress Notes (Signed)
   12/16/20 1730 12/16/20 1736  Vitals  BP 140/77 140/77  MAP (mmHg) 97  --   Pulse Rate 62 (!) 58  ECG Heart Rate 64 60  Resp 16 15  During Hemodialysis Assessment  Blood Flow Rate (mL/min) 300 mL/min 200 mL/min  Arterial Pressure (mmHg) -180 mmHg -100 mmHg  Venous Pressure (mmHg) 90 mmHg 60 mmHg  Transmembrane Pressure (mmHg) 60 mmHg 20 mmHg  Ultrafiltration Rate (mL/min) 500 mL/min 0 mL/min  Dialysate Flow Rate (mL/min) 500 ml/min 500 ml/min  Conductivity: Machine  13.8 13.8  Intra-Hemodialysis Comments Progressing as prescribed Tx completed

## 2020-12-18 NOTE — Progress Notes (Signed)
12/11/20 0815 12/11/20 0915 12/11/20 0930  Vitals  Temp (!) 100.9 F (38.3 C) 99.8 F (37.7 C)  --   BP 130/69  --  (!) 145/71  MAP (mmHg) 86  --  91  BP Location Left Arm  --   --   BP Method Automatic  --   --   Patient Position (if appropriate) Lying  --   --   Pulse Rate 70 83 80  ECG Heart Rate  --  83 80  Resp 16 10 (!) 21  During Hemodialysis Assessment  Blood Flow Rate (mL/min) 250 mL/min 250 mL/min 250 mL/min  Arterial Pressure (mmHg) -90 mmHg -90 mmHg -90 mmHg  Venous Pressure (mmHg) 90 mmHg 90 mmHg 90 mmHg  Transmembrane Pressure (mmHg) 60 mmHg 60 mmHg 60 mmHg  Ultrafiltration Rate (mL/min) 200 mL/min 200 mL/min 200 mL/min  Dialysate Flow Rate (mL/min) 500 ml/min 500 ml/min 500 ml/min  Conductivity: Machine  13.7 13.7 13.7  HD Safety Checks Performed Yes Yes Yes  Intra-Hemodialysis Comments Tx initiated Progressing as prescribed Progressing as prescribed    12/11/20 0945 12/11/20 1000 12/11/20 1015  Vitals  Temp  --   --   --   BP (!) 141/74 (!) 145/71 (!) 143/71  MAP (mmHg) 91 91 92  BP Location  --   --   --   BP Method  --   --   --   Patient Position (if appropriate)  --   --   --   Pulse Rate 74 67 64  ECG Heart Rate 74 70 65  Resp 16  --  19  During Hemodialysis Assessment  Blood Flow Rate (mL/min) 250 mL/min 250 mL/min 250 mL/min  Arterial Pressure (mmHg) -90 mmHg -90 mmHg -90 mmHg  Venous Pressure (mmHg) 90 mmHg 90 mmHg 90 mmHg  Transmembrane Pressure (mmHg) 60 mmHg 60 mmHg 60 mmHg  Ultrafiltration Rate (mL/min) 200 mL/min 200 mL/min 200 mL/min  Dialysate Flow Rate (mL/min) 500 ml/min 500 ml/min 500 ml/min  Conductivity: Machine  13.7 13.7 13.7  HD Safety Checks Performed Yes Yes Yes  Intra-Hemodialysis Comments Progressing as prescribed Progressing as prescribed Progressing as prescribed    12/11/20 1030 12/11/20 1045 12/11/20 1100  Vitals  Temp  --  (!) 100.8 F (38.2 C)  --   BP 136/70 133/77 135/70  MAP (mmHg) 90 94 89  BP Location  --    --   --   BP Method  --   --   --   Patient Position (if appropriate)  --   --   --   Pulse Rate (!) 56 60 (!) 58  ECG Heart Rate (!) 57 65 (!) 56  Resp 19 16 20   During Hemodialysis Assessment  Blood Flow Rate (mL/min) 250 mL/min 250 mL/min 250 mL/min  Arterial Pressure (mmHg) -90 mmHg -90 mmHg -90 mmHg  Venous Pressure (mmHg) 90 mmHg 90 mmHg 90 mmHg  Transmembrane Pressure (mmHg) 60 mmHg 60 mmHg 60 mmHg  Ultrafiltration Rate (mL/min) 200 mL/min 200 mL/min 200 mL/min  Dialysate Flow Rate (mL/min) 500 ml/min 500 ml/min 500 ml/min  Conductivity: Machine  13.7 13.7 13.7  HD Safety Checks Performed Yes Yes Yes  Intra-Hemodialysis Comments Progressing as prescribed Progressing as prescribed Progressing as prescribed    12/11/20 1115  Vitals  Temp  --   BP 138/70  MAP (mmHg) 90  BP Location  --   BP Method  --   Patient Position (if appropriate)  --  Pulse Rate 60  ECG Heart Rate (!) 58  Resp 20  During Hemodialysis Assessment  Blood Flow Rate (mL/min) 250 mL/min  Arterial Pressure (mmHg) -90 mmHg  Venous Pressure (mmHg) 90 mmHg  Transmembrane Pressure (mmHg) 60 mmHg  Ultrafiltration Rate (mL/min) 200 mL/min  Dialysate Flow Rate (mL/min) 500 ml/min  Conductivity: Machine  13.7  HD Safety Checks Performed Yes  Intra-Hemodialysis Comments Progressing as prescribed

## 2020-12-18 NOTE — Progress Notes (Signed)
   12/16/20 1611 12/16/20 1615 12/16/20 1630  Vitals  Temp 99.3 F (37.4 C)  --   --   Temp Source Oral  --   --   BP  --  (!) 141/81 (!) 143/81  MAP (mmHg)  --  98 98  Pulse Rate 72 76 71  ECG Heart Rate 75 77 70  Resp 16 13 18   During Hemodialysis Assessment  Blood Flow Rate (mL/min)  --  300 mL/min 300 mL/min  Arterial Pressure (mmHg)  --  -110 mmHg -130 mmHg  Venous Pressure (mmHg)  --  80 mmHg 80 mmHg  Transmembrane Pressure (mmHg)  --  60 mmHg 60 mmHg  Ultrafiltration Rate (mL/min)  --  500 mL/min 500 mL/min  Dialysate Flow Rate (mL/min)  --  500 ml/min 500 ml/min  Conductivity: Machine   --  13.8 13.8  Intra-Hemodialysis Comments  --  Progressing as prescribed Progressing as prescribed    12/16/20 1656 12/16/20 1700 12/16/20 1715  Vitals  Temp 99.3 F (37.4 C)  --   --   Temp Source Oral  --   --   BP (!) 142/79 (!) 141/79 (!) 143/78  MAP (mmHg)  --   --   --   Pulse Rate  --  66 66  ECG Heart Rate  --  68 67  Resp  --  12 16  During Hemodialysis Assessment  Blood Flow Rate (mL/min)  --  300 mL/min 300 mL/min  Arterial Pressure (mmHg)  --  -150 mmHg -130 mmHg  Venous Pressure (mmHg)  --  90 mmHg 90 mmHg  Transmembrane Pressure (mmHg)  --  60 mmHg 60 mmHg  Ultrafiltration Rate (mL/min)  --  500 mL/min 500 mL/min  Dialysate Flow Rate (mL/min)  --  500 ml/min 500 ml/min  Conductivity: Machine   --  13.8 13.8  Intra-Hemodialysis Comments  --  Progressing as prescribed Progressing as prescribed

## 2020-12-18 NOTE — Progress Notes (Signed)
Physical Therapy Treatment Patient Details Name: Tonya Myers MRN: [id number] DOB: 1966/01/04 Today's Date: 12/18/2020    History of Present Illness Pt admitted to [facility] on 11/19/20 for decreased appetite, vomiting, AMS, and lethargy. On EMS assessment, pt was found to have fever 102 and AMS with confusion and left sided weakness.  Found to have pancytopenia, oncology was consulted due to hx of CLL, but so far no concern of CLL relapse. ID also on board, initially concerning for meningitis due to hx of pneumococcal meningitis.  R IJ HD catheter placed 8/24. PMH significant for: DDD, cervical radiculopathy, anxiety, DM, HTN, hypothyroidism, CLL, asthma, cLBP, vertigo, and hx of stroke.    PT Comments    Pt resting in bed upon PT arrival.  R IJ HD permcath placed yesterday but pt still has R temporary fem cath in place so therapy limited to in bed ex's (limited R LE ex's per R temp fem cath precautions).  Pt tolerated LE bed level ex's fairly well but L LE appeared to fatigue with some ex's.  Will continue to focus on strengthening and will progress functional mobility once R temporary fem cath removed.    Follow Up Recommendations  [name]     Equipment Recommendations  Other (comment) (hoyer lift)    Recommendations for Other Services OT consult     Precautions / Restrictions Precautions Precautions: Fall Precaution Comments: currently has a temporary femoral trialysis catheter in right groin, avoid hip flexion; R IJ HD perm cath; R PICC; Seizure precaution Restrictions Weight Bearing Restrictions: No Other Position/Activity Restrictions: currently has a temporary femoral trialysis catheter in right groin, avoid hip flexion    Mobility  Bed Mobility               General bed mobility comments: Deferred d/t R temporary fem HD catheter    Transfers                    Ambulation/Gait                 Stairs             Wheelchair Mobility     Modified Rankin (Stroke Patients Only)       Balance                                            Cognition Arousal/Alertness: Awake/alert Behavior During Therapy: WFL for tasks assessed/performed Overall Cognitive Status: Within Functional Limits for tasks assessed                                        Exercises General Exercises - Lower Extremity Ankle Circles/Pumps: AROM;Strengthening;Both;Supine (10 reps x2 B LE's) Quad Sets: AROM;Strengthening;Both;Supine (10 reps x2 B LE's) Short Arc Quad: AROM;Strengthening;Left;Supine (15 reps x2) Heel Slides: AAROM;Strengthening;Left;Supine (10 reps x2) Hip ABduction/ADduction: AROM;Strengthening;Left;Supine (15 reps x2) Straight Leg Raises: AAROM;Strengthening;Left;Supine (10 reps x2)    General Comments  Nursing cleared pt for participation in physical therapy.  Pt agreeable to PT session.  Pt's family present during session.      Pertinent Vitals/Pain Pain Assessment: No/denies pain Pain Intervention(s): Limited activity within patient's tolerance;Monitored during session Vitals (HR and O2 on room air) stable and WFL throughout treatment session.    Home Living  Prior Function            PT Goals (current goals can now be found in the care plan section) Acute Rehab PT Goals Patient Stated Goal: to have less stiffness and get stronger PT Goal Formulation: With patient Time For Goal Achievement: 12/25/20 Potential to Achieve Goals: Fair Progress towards PT goals: Progressing toward goals    Frequency    Min 2X/week      PT Plan Current plan remains appropriate    Co-evaluation              AM-PAC PT "6 Clicks" Mobility   Outcome Measure  Help needed turning from your back to your side while in a flat bed without using bedrails?: A Lot Help needed moving from lying on your back to sitting on the side of a flat bed without using bedrails?: A  Lot Help needed moving to and from a bed to a chair (including a wheelchair)?: Total Help needed standing up from a chair using your arms (e.g., wheelchair or bedside chair)?: Total Help needed to walk in hospital room?: Total Help needed climbing 3-5 steps with a railing? : Total 6 Click Score: 8    End of Session   Activity Tolerance: Patient tolerated treatment well Patient left: in bed;with call bell/phone within reach;with bed alarm set;with family/visitor present;Other (comment) (B heels floating via pillow support) Nurse Communication: Mobility status;Precautions;Other (comment) (R temp fem cath precautions) PT Visit Diagnosis: Unsteadiness on feet (R26.81);Muscle weakness (generalized) (M62.81);Difficulty in walking, not elsewhere classified (R26.2);Hemiplegia and hemiparesis Hemiplegia - Right/Left: Right     Time: 0737-1062 PT Time Calculation (min) (ACUTE ONLY): 27 min  Charges:  $Therapeutic Exercise: 23-37 mins                    Leitha Bleak, PT 12/18/20, 12:13 PM

## 2020-12-18 NOTE — Progress Notes (Signed)
[facility]  ROUNDING NOTE   Subjective:   Platelets 37 Creatinine 4.35 (3.65) (4.99) (4.43) (3.64) (2.82)  Tmax 101.83F  Patient seen during dialysis   HEMODIALYSIS FLOWSHEET:  Blood Flow Rate (mL/min): 300 mL/min Arterial Pressure (mmHg): -80 mmHg Venous Pressure (mmHg): 60 mmHg Transmembrane Pressure (mmHg): 50 mmHg Ultrafiltration Rate (mL/min): 330 mL/min Dialysate Flow Rate (mL/min): 500 ml/min Conductivity: Machine : 13.7 Conductivity: Machine : 13.7 Dialysis Fluid Bolus: Normal Saline Bolus Amount (mL): 200 mL  No complaints at this time Anxious for biopsy results   Objective:  Vital signs in last 24 hours:  Temp:  [98.5 F (36.9 C)-102 F (38.9 C)] 98.5 F (36.9 C) (08/25 0732) Pulse Rate:  [70-87] 79 (08/25 0732) Resp:  [14-20] 16 (08/25 0732) BP: (127-180)/(67-84) 127/81 (08/25 0732) SpO2:  [86 %-100 %] 98 % (08/25 0732) Weight:  [94.4 kg] 94.4 kg (08/24 1450)  Weight change:  Filed Weights   12/01/20 1730 12/10/20 1345 12/17/20 1450  Weight: 94.4 kg 94.4 kg 94.4 kg    Intake/Output: I/O last 3 completed shifts: In: 433 [I.V.:3; Blood:380; IV Piggyback:50] Out: 700 [Urine:700]   Intake/Output this shift:  No intake/output data recorded.  Physical Exam: General: NAD, resting in bed  Head: Normocephalic, atraumatic. Moist oral mucosal membranes  Eyes: Anicteric  Lungs:  Clear to auscultation, normal effort  Heart: Regular rate and rhythm  Abdomen:  Soft, nontender  Extremities: 1+ peripheral edema.  Neurologic: Alert, moving all four extremities  Skin: No lesions  Access RUE femoral temp cath 8/17 Schnier, Rt Permcath placed on 10/19/92    Basic Metabolic Panel: Recent Labs  Lab 12/14/20 0528 12/15/20 0523 12/16/20 0516 12/17/20 0346 12/18/20 0505  NA 136 136 137 135 136  K 3.3* 4.2 4.0 3.6 3.6  CL 101 102 100 98 97*  CO2 29 30 30 30 30   GLUCOSE 122* 136* 119* 187* 136*  BUN 37* 40* 43* 25* 30*  CREATININE 3.64*  4.43* 4.99* 3.65* 4.35*  CALCIUM 7.4* 7.4* 7.4* 7.1* 7.2*  MG 1.9 1.9 2.0 1.7 1.7     Liver Function Tests: Recent Labs  Lab 12/14/20 0528 12/15/20 0523 12/16/20 0516 12/17/20 0346 12/18/20 0505  AST 35 43* 53* 70* 57*  ALT 12 13 15 18 10   ALKPHOS 74 73 78 84 84  BILITOT 0.6 0.6 0.7 0.9 0.9  PROT 5.3* 4.7* 4.6* 4.8* 5.1*  ALBUMIN 2.1* 2.0* 2.0* 2.1* 2.1*    No results for input(s): LIPASE, AMYLASE in the last 168 hours.  No results for input(s): AMMONIA in the last 168 hours.  CBC: Recent Labs  Lab 12/14/20 1537 12/15/20 0523 12/15/20 2142 12/16/20 0516 12/17/20 0346 12/18/20 0505  WBC 6.4 6.3  --  7.6 6.9 5.0  NEUTROABS 4.8 4.7  --  5.5 5.2 3.3  HGB 7.8* 7.6*  --  6.7* 9.0* 8.9*  HCT 23.1* 22.1*  --  20.2* 25.9* 25.4*  MCV 82.5 81.5  --  84.2 83.5 84.4  PLT 44* 34* 65* 57* 44* 37*     Cardiac Enzymes: No results for input(s): CKTOTAL, CKMB, CKMBINDEX, TROPONINI in the last 168 hours.   BNP: Invalid input(s): POCBNP  CBG: Recent Labs  Lab 12/17/20 1211 12/17/20 1436 12/17/20 1649 12/17/20 2046 12/18/20 0732  GLUCAP 208* 186* 144* 248* 125*     Microbiology: Results for orders placed or performed during the hospital encounter of 11/19/20  Blood culture (routine x 2)     Status: None   Collection Time:  11/19/20  4:47 PM   Specimen: BLOOD  Result Value Ref Range Status   Specimen Description BLOOD RIGHT ANTECUBITAL  Final   Special Requests   Final    BOTTLES DRAWN AEROBIC AND ANAEROBIC Blood Culture adequate volume   Culture   Final    NO GROWTH 5 DAYS Performed at [facility], [address] [place], [place] [address]    Report Status 11/24/2020 FINAL  Final  Resp Panel by RT-PCR (Flu A&B, Covid) Nasopharyngeal Swab     Status: None   Collection Time: 11/19/20  4:48 PM   Specimen: Nasopharyngeal Swab; Nasopharyngeal(NP) swabs in vial transport medium  Result Value Ref Range Status   SARS Coronavirus 2 by RT PCR NEGATIVE  NEGATIVE Final    Comment: (NOTE) SARS-CoV-2 target nucleic acids are NOT DETECTED.  The SARS-CoV-2 RNA is generally detectable in upper respiratory specimens during the acute phase of infection. The lowest concentration of SARS-CoV-2 viral copies this assay can detect is 138 copies/mL. A negative result does not preclude SARS-Cov-2 infection and should not be used as the sole basis for treatment or other patient management decisions. A negative result may occur with  improper specimen collection/handling, submission of specimen other than nasopharyngeal swab, presence of viral mutation(s) within the areas targeted by this assay, and inadequate number of viral copies(<138 copies/mL). A negative result must be combined with clinical observations, patient history, and epidemiological information. The expected result is Negative.  Fact Sheet for Patients:  EntrepreneurPulse.com.au  Fact Sheet for Healthcare Providers:  IncredibleEmployment.be  This test is no t yet approved or cleared by the [place] FDA and  has been authorized for detection and/or diagnosis of SARS-CoV-2 by FDA under an Emergency Use Authorization (EUA). This EUA will remain  in effect (meaning this test can be used) for the duration of the COVID-19 declaration under Section 564(b)(1) of the Act, 21 U.S.C.section 360bbb-3(b)(1), unless the authorization is terminated  or revoked sooner.       Influenza A by PCR NEGATIVE NEGATIVE Final   Influenza B by PCR NEGATIVE NEGATIVE Final    Comment: (NOTE) The Xpert Xpress SARS-CoV-2/FLU/RSV plus assay is intended as an aid in the diagnosis of influenza from Nasopharyngeal swab specimens and should not be used as a sole basis for treatment. Nasal washings and aspirates are unacceptable for Xpert Xpress SARS-CoV-2/FLU/RSV testing.  Fact Sheet for Patients: EntrepreneurPulse.com.au  Fact Sheet for Healthcare  Providers: IncredibleEmployment.be  This test is not yet approved or cleared by the [place] FDA and has been authorized for detection and/or diagnosis of SARS-CoV-2 by FDA under an Emergency Use Authorization (EUA). This EUA will remain in effect (meaning this test can be used) for the duration of the COVID-19 declaration under Section 564(b)(1) of the Act, 21 U.S.C. section 360bbb-3(b)(1), unless the authorization is terminated or revoked.  Performed at [facility], [address] [place], [place] [address]   Blood culture (routine x 2)     Status: None   Collection Time: 11/19/20  6:46 PM   Specimen: BLOOD  Result Value Ref Range Status   Specimen Description BLOOD RIGHT ANTECUBITAL  Final   Special Requests   Final    BOTTLES DRAWN AEROBIC AND ANAEROBIC Blood Culture adequate volume   Culture   Final    NO GROWTH 5 DAYS Performed at [facility], [address], [place], [place] [address]    Report Status 11/24/2020 FINAL  Final  Urine Culture  Status: Abnormal   Collection Time: 11/19/20  9:33 PM   Specimen: Urine, Clean Catch  Result Value Ref Range Status   Specimen Description   Final    URINE, CLEAN CATCH Performed at [facility], [address] [place], [place] [address]    Special Requests   Final    NONE Performed at [facility], [address] [place], [place] [address]    Culture (A)  Final    <10,000 COLONIES/mL INSIGNIFICANT GROWTH Performed at [facility] [address] [place], [place] [address]    Report Status 11/21/2020 FINAL  Final  Culture, fungus without smear     Status: None   Collection Time: 11/20/20 11:09 AM   Specimen: CSF; Cerebrospinal Fluid  Result Value Ref Range Status   Specimen Description   Final    CSF Performed at [facility], [address], [place], [place] [address]    Special Requests   Final    NONE Performed at [facility], [address] [place], [place] [address]    Culture   Final    NO FUNGUS ISOLATED AFTER 21 DAYS Performed at [facility], [place] [address] [place], [place] [address]    Report Status 12/11/2020 FINAL  Final  CSF culture w Gram Stain     Status: None   Collection Time: 11/20/20 11:09 AM   Specimen: PATH Cytology CSF; Cerebrospinal Fluid  Result Value Ref Range Status   Specimen Description   Final    CSF Performed at [facility], [address], [place], [place] [address]    Special Requests   Final    NONE Performed at [facility], [address] [place], [place] [address]    Gram Stain   Final    NO ORGANISMS SEEN WBC SEEN RED BLOOD CELLS PRESENT Performed at [facility], [address], [place], [place] [address]    Culture   Final    NO GROWTH 3 DAYS Performed at [facility], [place] [address] [place], [place] [address]    Report Status 11/23/2020 FINAL  Final  Fungus Culture With Stain     Status: None (Preliminary result)   Collection Time: 11/20/20 11:09 AM   Specimen: PATH Cytology CSF; Cerebrospinal Fluid  Result Value Ref Range Status   Fungus Stain Final report  Final    Comment: (NOTE) Performed At: [facility] [place], [place] [id number] Rush Farmer MD [id number]    Fungus (Mycology) Culture PENDING  Incomplete   Fungal Source CSF  Final    Comment: Performed at [facility], [address] [place], [place] [address]  Fungus Culture Result     Status: None   Collection Time: 11/20/20 11:09 AM  Result Value Ref Range Status   Result 1 Comment  Final    Comment: (NOTE) KOH/Calcofluor preparation:  no fungus observed. Performed At: [facility] [place], [place] [id number] Rush Farmer MD [id number]   Resp Panel by RT-PCR (Flu A&B, Covid) Nasopharyngeal Swab     Status: None   Collection Time: 11/27/20  1:39 PM   Specimen:  Nasopharyngeal Swab; Nasopharyngeal(NP) swabs in vial transport medium  Result Value Ref Range Status   SARS Coronavirus 2 by RT PCR NEGATIVE NEGATIVE Final    Comment: (NOTE) SARS-CoV-2 target nucleic acids are NOT DETECTED.  The SARS-CoV-2 RNA is generally detectable in upper respiratory specimens during the  acute phase of infection. The lowest concentration of SARS-CoV-2 viral copies this assay can detect is 138 copies/mL. A negative result does not preclude SARS-Cov-2 infection and should not be used as the sole basis for treatment or other patient management decisions. A negative result may occur with  improper specimen collection/handling, submission of specimen other than nasopharyngeal swab, presence of viral mutation(s) within the areas targeted by this assay, and inadequate number of viral copies(<138 copies/mL). A negative result must be combined with clinical observations, patient history, and epidemiological information. The expected result is Negative.  Fact Sheet for Patients:  EntrepreneurPulse.com.au  Fact Sheet for Healthcare Providers:  IncredibleEmployment.be  This test is no t yet approved or cleared by the [place] FDA and  has been authorized for detection and/or diagnosis of SARS-CoV-2 by FDA under an Emergency Use Authorization (EUA). This EUA will remain  in effect (meaning this test can be used) for the duration of the COVID-19 declaration under Section 564(b)(1) of the Act, 21 U.S.C.section 360bbb-3(b)(1), unless the authorization is terminated  or revoked sooner.       Influenza A by PCR NEGATIVE NEGATIVE Final   Influenza B by PCR NEGATIVE NEGATIVE Final    Comment: (NOTE) The Xpert Xpress SARS-CoV-2/FLU/RSV plus assay is intended as an aid in the diagnosis of influenza from Nasopharyngeal swab specimens and should not be used as a sole basis for treatment. Nasal washings and aspirates are unacceptable for  Xpert Xpress SARS-CoV-2/FLU/RSV testing.  Fact Sheet for Patients: EntrepreneurPulse.com.au  Fact Sheet for Healthcare Providers: IncredibleEmployment.be  This test is not yet approved or cleared by the [place] FDA and has been authorized for detection and/or diagnosis of SARS-CoV-2 by FDA under an Emergency Use Authorization (EUA). This EUA will remain in effect (meaning this test can be used) for the duration of the COVID-19 declaration under Section 564(b)(1) of the Act, 21 U.S.C. section 360bbb-3(b)(1), unless the authorization is terminated or revoked.  Performed at [facility], [address] [place], [place] [address]   Urine Culture     Status: Abnormal   Collection Time: 11/27/20  2:15 PM   Specimen: Kidney; Urine  Result Value Ref Range Status   Specimen Description   Final    URINE, CLEAN CATCH Performed at [facility], [address], [place], [place] [address]    Special Requests   Final    NONE Performed at [facility], [place], [place] [address]    Culture 20,000 COLONIES/mL ENTEROBACTER AEROGENES (A)  Final   Report Status 11/30/2020 FINAL  Final   Organism ID, Bacteria ENTEROBACTER AEROGENES (A)  Final      Susceptibility   Enterobacter aerogenes - MIC*    CEFAZOLIN >=64 RESISTANT Resistant     CEFEPIME 1 SENSITIVE Sensitive     CEFTRIAXONE >=64 RESISTANT Resistant     CIPROFLOXACIN <=0.25 SENSITIVE Sensitive     GENTAMICIN <=1 SENSITIVE Sensitive     IMIPENEM <=0.25 SENSITIVE Sensitive     NITROFURANTOIN 64 INTERMEDIATE Intermediate     TRIMETH/SULFA <=20 SENSITIVE Sensitive     PIP/TAZO >=128 RESISTANT Resistant     * 20,000 COLONIES/mL ENTEROBACTER AEROGENES  CULTURE, BLOOD (ROUTINE X 2) w Reflex to ID Panel     Status: None   Collection Time: 11/27/20  8:23 PM   Specimen: BLOOD  Result Value Ref Range Status   Specimen Description BLOOD LAC  Final    Special Requests   Final  BOTTLES DRAWN AEROBIC AND ANAEROBIC Blood Culture results may not be optimal due to an inadequate volume of blood received in culture bottles   Culture   Final    NO GROWTH 5 DAYS Performed at [facility], [address] [place], [place] [address]    Report Status 12/02/2020 FINAL  Final  CULTURE, BLOOD (ROUTINE X 2) w Reflex to ID Panel     Status: None   Collection Time: 11/27/20  9:32 PM   Specimen: BLOOD LEFT HAND  Result Value Ref Range Status   Specimen Description BLOOD LEFT HAND  Final   Special Requests   Final    BOTTLES DRAWN AEROBIC AND ANAEROBIC Blood Culture adequate volume   Culture   Final    NO GROWTH 5 DAYS Performed at [facility], [address] [place], [place] [address]    Report Status 12/02/2020 FINAL  Final  CULTURE, BLOOD (ROUTINE X 2) w Reflex to ID Panel     Status: None   Collection Time: 12/06/20 10:35 AM   Specimen: BLOOD  Result Value Ref Range Status   Specimen Description BLOOD LT KNUCKLE  Final   Special Requests   Final    BOTTLES DRAWN AEROBIC AND ANAEROBIC Blood Culture adequate volume   Culture   Final    NO GROWTH 5 DAYS Performed at [facility], [address] [place], [place] [address]    Report Status 12/11/2020 FINAL  Final  CULTURE, BLOOD (ROUTINE X 2) w Reflex to ID Panel     Status: None   Collection Time: 12/06/20 10:35 AM   Specimen: BLOOD  Result Value Ref Range Status   Specimen Description BLOOD BLOOD LEFT WRIST  Final   Special Requests AEROBIC BOTTLE ONLY Blood Culture adequate volume  Final   Culture   Final    NO GROWTH 5 DAYS Performed at [facility], [address] [place], [place] [address]    Report Status 12/11/2020 FINAL  Final  Culture, blood (single) w Reflex to ID Panel     Status: None (Preliminary result)   Collection Time: 12/14/20  2:36 PM   Specimen: BLOOD  Result Value Ref Range Status   Specimen Description BLOOD BLOOD RIGHT  HAND  Final   Special Requests   Final    BOTTLES DRAWN AEROBIC AND ANAEROBIC Blood Culture adequate volume   Culture   Final    NO GROWTH 4 DAYS Performed at [facility], [address] [place], [place] [address]    Report Status PENDING  Incomplete  Culture, blood (single) w Reflex to ID Panel     Status: None (Preliminary result)   Collection Time: 12/14/20  3:38 PM   Specimen: BLOOD  Result Value Ref Range Status   Specimen Description BLOOD A-LINE DRAW  Final   Special Requests   Final    BOTTLES DRAWN AEROBIC AND ANAEROBIC Blood Culture adequate volume   Culture   Final    NO GROWTH 4 DAYS Performed at [facility], [address] [place],  [address]    Report Status PENDING  Incomplete    Coagulation Studies: Recent Labs    12/16/20 0516  LABPROT 14.4  INR 1.1     Urinalysis: Recent Labs    12/15/20 1754  COLORURINE YELLOW*  LABSPEC 1.013  PHURINE 9.0*  GLUCOSEU 50*  HGBUR LARGE*  BILIRUBINUR NEGATIVE  KETONESUR NEGATIVE  PROTEINUR 100*  NITRITE NEGATIVE  LEUKOCYTESUR SMALL*  Imaging: PERIPHERAL VASCULAR CATHETERIZATION  Result Date: 12/17/2020 See surgical note for result.    Medications:       amLODipine  5 mg Oral Daily   Chlorhexidine Gluconate Cloth  6 each Topical Daily   diltiazem  120 mg Oral Daily   feeding supplement  1 Container Oral TID BM   insulin aspart  0-15 Units Subcutaneous TID WC   insulin aspart  0-5 Units Subcutaneous QHS   levothyroxine  100 mcg Oral QAC breakfast   magnesium oxide  800 mg Oral BID   mouth rinse  15 mL Mouth Rinse BID   mirtazapine  15 mg Oral QHS   multivitamin with minerals  1 tablet Oral Daily   pantoprazole  40 mg Oral Daily   pneumococcal 13-valent conjugate vaccine  0.5 mL Intramuscular Tomorrow-1000   psyllium  1 packet Oral Daily   rosuvastatin  10 mg Oral QHS   sodium bicarbonate  650 mg Oral TID   sodium chloride flush  10-40 mL Intracatheter Q12H    thiamine  100 mg Oral Daily   valACYclovir  500 mg Oral Daily   acetaminophen **OR** acetaminophen, albuterol, haloperidol lactate, HYDROmorphone (DILAUDID) injection, loperamide, ondansetron **OR** ondansetron (ZOFRAN) IV, ondansetron (ZOFRAN) IV, oxyCODONE, polyethylene glycol, sodium chloride flush  Assessment/ Plan:  [name] is a 55 y.o. black female with diabetes mellitus type II, hypertension, hypothyroidism, CLL who was admitted to [facility] on 11/19/2020 for Altered mental status [R41.82] Encephalopathy acute [G93.40] Altered mental status, unspecified altered mental status type [R41.82] Sepsis with encephalopathy without septic shock, due to unspecified organism ([place]) [A41.9, R65.20, G93.40]  Acute Kidney Injury with hematuria and proteinuria with baseline creatinine 0.49 on 11/22/20.  IV contrast exposure on November 21, 2020.  Renal ultrasound negative for mass and obstruction.  Serologic studies: ANA+, ENA+, antichromatin +, anti- Ro_, P-ANCA + Completed 3 days of high dose IV steroids Rituximab on 8/17. Second dose in 4 weeks.  - Appreciate IR performing renal biopsy 12/16/20. Expect preliminary results in 1-2 days. Treatment plan pending biopsy results - Appreciate Vascular for placing Rt Permcath on 12/17/20 - Began discussing outpatient dialysis need with patient and she has requested the [facility] clinic if outpatient is necessary. - Will place order for temp cath removal by nursing.  - Valcyte restarted by ID at recommended renal dosing  - Dialysis scheduled for today, will need to be in chair for next treatment.  - Next treatment scheduled for Saturday   Lab Results  Component Value Date   CREATININE 4.35 (H) 12/18/2020   CREATININE 3.65 (H) 12/17/2020   CREATININE 4.99 (H) 12/16/2020    Intake/Output Summary (Last 24 hours) at 12/18/2020 1029 Last data filed at 12/18/2020 1023 Gross per 24 hour  Intake 50 ml  Output 500 ml  Net -450 ml    2.  Anemia  with history of CLL and thrombocytopenia Lab Results  Component Value Date   HGB 8.9 (L) 12/18/2020   Bone marrow biopsy 12/04/20, limited marrow material with trilineage hematopoiesis. Recommending repeat bone marrow biopsy Appreciate hematology input.  Now with bleeding from catheter site.  - Numerous platelet transfusions - Will remove temp cath once returned to unit.   3. Fever of unknown origin: not currently on antibiotics. Receiving blood products and rituximab. Most recent cultures are negative.       LOS: [place] 8/25/202210:29 AM

## 2020-12-18 NOTE — Progress Notes (Signed)
Temp 99.9, NP and MD aware, tylenol given as ordered

## 2020-12-19 ENCOUNTER — Encounter: Payer: Self-pay | Admitting: Nephrology

## 2020-12-19 ENCOUNTER — Encounter: Payer: Medicare Other | Admitting: Physical Medicine and Rehabilitation

## 2020-12-19 DIAGNOSIS — D8989 Other specified disorders involving the immune mechanism, not elsewhere classified: Secondary | ICD-10-CM | POA: Diagnosis not present

## 2020-12-19 LAB — COMPREHENSIVE METABOLIC PANEL
ALT: 10 U/L (ref 0–44)
AST: 56 U/L — ABNORMAL HIGH (ref 15–41)
Albumin: 1.9 g/dL — ABNORMAL LOW (ref 3.5–5.0)
Alkaline Phosphatase: 77 U/L (ref 38–126)
Anion gap: 6 (ref 5–15)
BUN: 26 mg/dL — ABNORMAL HIGH (ref 6–20)
CO2: 28 mmol/L (ref 22–32)
Calcium: 6.8 mg/dL — ABNORMAL LOW (ref 8.9–10.3)
Chloride: 100 mmol/L (ref 98–111)
Creatinine, Ser: 3.88 mg/dL — ABNORMAL HIGH (ref 0.44–1.00)
GFR, Estimated: 13 mL/min — ABNORMAL LOW (ref >60)
Glucose, Bld: 197 mg/dL — ABNORMAL HIGH (ref 70–99)
Potassium: 3.5 mmol/L (ref 3.5–5.1)
Sodium: 134 mmol/L — ABNORMAL LOW (ref 135–145)
Total Bilirubin: 1.1 mg/dL (ref 0.3–1.2)
Total Protein: 4.6 g/dL — ABNORMAL LOW (ref 6.5–8.1)

## 2020-12-19 LAB — CULTURE, BLOOD (SINGLE)
Culture: NO GROWTH
Culture: NO GROWTH
Special Requests: ADEQUATE
Special Requests: ADEQUATE

## 2020-12-19 LAB — CBC WITH DIFFERENTIAL/PLATELET
Abs Immature Granulocytes: 0.04 10*3/uL (ref 0.00–0.07)
Basophils Absolute: 0 10*3/uL (ref 0.0–0.1)
Basophils Relative: 0 %
Eosinophils Absolute: 0 10*3/uL (ref 0.0–0.5)
Eosinophils Relative: 0 %
HCT: 18.5 % — ABNORMAL LOW (ref 36.0–46.0)
Hemoglobin: 6.3 g/dL — ABNORMAL LOW (ref 12.0–15.0)
Immature Granulocytes: 1 %
Lymphocytes Relative: 29 %
Lymphs Abs: 1.5 10*3/uL (ref 0.7–4.0)
MCH: 29.2 pg (ref 26.0–34.0)
MCHC: 34.1 g/dL (ref 30.0–36.0)
MCV: 85.6 fL (ref 80.0–100.0)
Monocytes Absolute: 0.2 10*3/uL (ref 0.1–1.0)
Monocytes Relative: 4 %
Neutro Abs: 3.4 10*3/uL (ref 1.7–7.7)
Neutrophils Relative %: 66 %
Platelets: 33 10*3/uL — ABNORMAL LOW (ref 150–400)
RBC: 2.16 MIL/uL — ABNORMAL LOW (ref 3.87–5.11)
RDW: 16 % — ABNORMAL HIGH (ref 11.5–15.5)
Smear Review: NORMAL
WBC: 5.2 10*3/uL (ref 4.0–10.5)
nRBC: 0 % (ref 0.0–0.2)

## 2020-12-19 LAB — GLUCOSE, CAPILLARY
Glucose-Capillary: 195 mg/dL — ABNORMAL HIGH (ref 70–99)
Glucose-Capillary: 399 mg/dL — ABNORMAL HIGH (ref 70–99)
Glucose-Capillary: 468 mg/dL — ABNORMAL HIGH (ref 70–99)
Glucose-Capillary: 382 mg/dL — ABNORMAL HIGH (ref 70–99)

## 2020-12-19 LAB — PREPARE RBC (CROSSMATCH)

## 2020-12-19 LAB — FERRITIN: Ferritin: 2095 ng/mL — ABNORMAL HIGH (ref 11–307)

## 2020-12-19 LAB — FUNGITELL, SERUM: Fungitell Result: 37 pg/mL (ref <80)

## 2020-12-19 LAB — CMV DNA, QUANTITATIVE, PCR
CMV DNA Quant: 205 IU/mL
Log10 CMV Qn DNA Pl: 2.312 log10 IU/mL

## 2020-12-19 LAB — LACTATE DEHYDROGENASE: LDH: 310 U/L — ABNORMAL HIGH (ref 98–192)

## 2020-12-19 LAB — MAGNESIUM: Magnesium: 1.5 mg/dL — ABNORMAL LOW (ref 1.7–2.4)

## 2020-12-19 MED ORDER — SODIUM CHLORIDE 0.9 % IV BOLUS
250.0000 mL | Freq: Once | INTRAVENOUS | Status: AC
  Administered 2020-12-19: 250 mL via INTRAVENOUS

## 2020-12-19 MED ORDER — SODIUM CHLORIDE 0.9% IV SOLUTION: Freq: Once | INTRAVENOUS | Status: AC

## 2020-12-19 MED ORDER — SODIUM CHLORIDE 0.9 % IV SOLN: INTRAVENOUS | Status: DC

## 2020-12-19 MED ORDER — PREDNISONE 50 MG PO TABS
80.0000 mg | ORAL_TABLET | Freq: Every day | ORAL | Status: DC
  Administered 2020-12-19 – 2020-12-22 (×4): 80 mg via ORAL
  Filled 2020-12-19 (×4): qty 1

## 2020-12-19 MED ORDER — NOREPINEPHRINE 4 MG/250ML-% IV SOLN: 0.0000 ug/min | INTRAVENOUS | Status: DC

## 2020-12-19 MED ORDER — MIDODRINE HCL 5 MG PO TABS
10.0000 mg | ORAL_TABLET | Freq: Three times a day (TID) | ORAL | Status: DC
  Administered 2020-12-19 (×2): 10 mg via ORAL
  Filled 2020-12-19 (×2): qty 2

## 2020-12-19 NOTE — Progress Notes (Signed)
Patient clinically accepted at [facility]TS 11:45am. Patient can start on 8/30 at 11:00am if medically ready. Please contact me with any dialysis placement concerns.  Elvera Bicker Dialysis Coordinator [phone]

## 2020-12-19 NOTE — Significant Event (Signed)
Rapid Response Event Note   Reason for Call :   Hypotension and Increased temp   Initial Focused Assessment:  Patient Alert and oriented. Patient complains of no pain or weakness. Patient given Cardizem and amiodarone following dialysis earlier in shift.   2305 BP 87/53  T 100.6 HR 98    Interventions:  NS bolus and Midodrine ordered.   0115- 2 hour update on patient patient temperature has improved but still hypotensive no change from previous assignment.  Dr Sidney Ace made aware another bolus ordered and instructions to transfer to higher level of care if patients BP does not respond to fluids BP 89/51 HR 94   0303 Patient still hypotension 86/40 HT 79 Orders placed for stepdown BP 86/40 HR 79    MD Notified: Dr Sidney Ace  Call Time:2308 Arrival Time:2310 End [id number]  Gonzella Lex, RN

## 2020-12-19 NOTE — Progress Notes (Signed)
PT Cancellation Note  Patient Details Name: Tonya Myers MRN: [id number] DOB: [birth year]   Cancelled Treatment:    Reason Eval/Treat Not Completed: Patient not medically ready.  Chart reviewed.  Pt's Hgb noted to be 6.3 this morning (nurse reports pt pending transfusion).  Per PT guidelines for low Hgb, will hold PT at this time.  Pt also still with R temporary fem cath; nurse reports plan to remove today.  Will re-attempt PT session at a later date/time.  Leitha Bleak, PT 12/19/20, 2:45 PM

## 2020-12-19 NOTE — Progress Notes (Signed)
Request from [facility], RN to change dressing to R femoral HD cath. She reports she is uncomfortable changing dressing, care/order instruction for bedside RN to remove temporary femoral HD cath noted. Malka reports the line should remain for now in case additional access is needed due to pt status change.   R femoral dressing loose with what appears to be dried stool on dressing. Pt wearing incontinent brief. Upon removal of dressing and biopatch, noted small silk-type dressing wrapped around insertion site. (? Controlling bleeding?) Small piece retained medially as there was resistance and scant bleeding with attempted removal. Site cleaned, Secure port and new dressing applied.  RN aware PICC dressing due for change.

## 2020-12-19 NOTE — Progress Notes (Addendum)
Notified Dr. Sidney Ace of nephrology NP order to remove R femoral HD catheter.  This Probation officer requested from MD to place an order to "discontinue central line" (R femoral) per IV team request.  Per Dr Sidney Ace to keep femoral line for now in case patient will need pressors.  IV team nurse made aware.    Consult for IV team ordered to change dressing to HD catheter (R femoral).  Stain of dry stool on dressing.

## 2020-12-19 NOTE — Progress Notes (Signed)
Critical care note:  Date of note: 12/19/2020  Subjective: The patient has been hypotensive this evening with a BP of 74/46.  She  denied lightheadedness, or dizziness. She was asymptomatic.  No chest pain or dyspnea or cough or wheezing.  She came back from dialysis around 1800. BP then was 121/72 and amlodipine and Cardizem given at that time. She had fever at 100.5 at 1800, but later in the evening was s 99.4.  IV normal saline, 50 mL bolus was ordered. BP improved 87/53 post bolus.  Oral temp 102, 2nd recheck 100.6.  Patient was asymptomatic.  She was given Tylenol and had a rapid response called.  Another bolus of 500 mL was ordered.  I ordered 100 mg of IV Solu-Cortef.  After the bolus BP  came up to 89/51 with a temperature of 99.3 orally.  MAP was 62 then.  Patient was then given 250 mL bolus and unfortunately BP continued to drop to 86/40. Map 52.  Objective: Physical examination: Generally: Acutely ill looking middle-aged African-American female in no acute respiratory distress. Vital signs per history of present illness. Head - atraumatic, normocephalic.  Pupils - equal, round and reactive to light and accommodation. Extraocular movements are intact. No scleral icterus.  Positive pallor. Oropharynx - moist mucous membranes and tongue. No pharyngeal erythema or exudate.  Neck - supple. No JVD. Carotid pulses 2+ bilaterally. No carotid bruits. No palpable thyromegaly or lymphadenopathy. Cardiovascular - regular rate and rhythm. Normal S1 and S2. No murmurs, gallops or rubs.  Lungs - clear to auscultation bilaterally.  Abdomen - soft and nontender. Positive bowel sounds. No palpable organomegaly or masses.  Extremities - no pitting edema, clubbing or cyanosis.  Neuro - grossly non-focal. Skin - no rashes. Breast, pelvic and rectal - deferred.  Labs and notes were reviewed.  Assessment/plan: 1.  Hypotension and suspected shock with differential diagnosis including hypovolemic and  septic.  The patient has received Norvasc as well as p.o. Cardizem earlier in the evening that could have certainly contributed to her hypotension.  The patient has acute kidney injury years status post hemodialysis in the setting of suspected autoimmune disorder with differential diagnosis including SLE, vasculitis and RPGN with improving metabolic encephalopathy, type 2 diabetes mellitus, hypothyroidism, dyslipidemia and history of hypertension. - The patient received several boluses of IV normal saline with persistent hypotension. That she also received p.o. midodrine. - Given the fact that the hemodialysis patient and persistent low maps of 52 with a pulse oximetry of 91% on room air, she may need to be upgraded to an ICU bed if she has persistent hypotension despite fluid boluses and midodrine at which case we will transfer her to the ICU and start on IV Levophed. - We will obtain a manual stat blood pressure before transfer. - I discontinued her Cardizem CD given her bradycardia and the fact that she is already on another calcium channel blocker Norvasc. - ICU consult will be obtained only as needed if there is a need for IV pressors.  Authorized and performed by: Eugenie Norrie, MD Total critical care time: Approximately   45    minutes. Due to a high probability of clinically significant, life-threatening deterioration, the patient required my highest level of preparedness to intervene emergently and I personally spent this critical care time directly and personally managing the patient.  This critical care time included obtaining a history, examining the patient, pulse oximetry, ordering and review of studies, arranging urgent treatment with development of management plan,  evaluation of patient's response to treatment, frequent reassessment, and discussions with other providers. This critical care time was performed to assess and manage the high probability of imminent, life-threatening deterioration  that could result in multiorgan failure.  It was exclusive of separately billable procedures and treating other patients and teaching time.  Please see MDM section and the rest of the note for further information on patient assessment and treatment.

## 2020-12-19 NOTE — Progress Notes (Signed)
Received IV Team consult from RN with comment to remove femoral HD cath. RN informed that VAST requires physician order for central line removal.

## 2020-12-19 NOTE — Progress Notes (Signed)
Progress Note    Tonya Myers   MRN:[id number]  DOB: 10/07/1965  DOA: 11/19/2020     27  PCP: Hedrick, James, MD  Initial CC: AMS  Hospital Course: Tonya Myers is a 54 y.o. female with medical history significant for recent pneumococcal meningitis and bacteremia, depression, hypothyroid, IDDM2 who presented to the ED with AMS. She underwent extensive work-up after admission with multiple subspecialty consults. Infectious work-up with ID is negative and ID has signed off on 12/09/2020 (see FUO workup below). She also underwent further work-up with nephrology, oncology, and rheumatology. She has multiple serologies concerning for possible lupus and trying to now obtain renal biopsy to help further diagnose.  Thrombocytopenia has been preventing biopsy obtainment. Due to worsening renal function, she was started on HD.   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  Results 8/26 - P-ANCA mediated necrotizing GN - She does not require plasmapheresis at this time - Resume steroids and continue rituximab (8/19) per consults  Oral thrush Resolved --s/p Diflucan for 3 doses  Pancytopenia (HCC) History of CLL  Oncology following Patient's CLL has been in remission since 2016 Recurrence of hematologic malignancy felt unlikely - likely exacerbated by above --1u PRBC 8/5  - 1u PRBC 8/10 - 1u PRBC 8/18 - 1u PRBC 8/21 - 2u PRBC 8/23 - 1u PRBC 8/26  FUO (fever of unknown origin) - Likely autoimmune in etiology given above - ID has followed initially and signed off on 8/16 due to lack of any infectious process -reconsulted on 8/23 due to recurrent fevers - negative procalcitonin again likely autoimmune - Fevers initially  resolved with steroids, now recurrent fevers daily after steroid cessation - hopefully to improve now that steroids are restarted - Follow up UA/culture and blood cultures - remain negative  Thrombocytopenia (HCC) - also see pancytopenia above - 2 packs platelets 8/17  - 1 pool plt 8/18 - still low on 8/20; [id number] ordered per nephro on 8/20 - 2 pool Platelets on 8/20, 8/22, 8/23 - 1 pool 8/26 for central line removal (plt 33)  Hypokalemia/Hypomagnesemia - Replete and recheck as needed - WNL  AKI (acute kidney injury) (HCC) transitioning to renal failure, ongoing - baseline creat ~0.7 and renal function has continued to deteriorate - nephrology following  - Renal biopsy 12/16/2020 results as above P-ANCA GN - s/p femoral trialysis on 8/17; started 1st HD session on 8/17 as well -transition to permacath 12/17/2020 - continue HD per nephrology  Acute metabolic encephalopathy, improving - 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  CT head neg for new stroke.  Hematology ruled out CLL as the culprit.  No seizure on EEG. --Continue vitamin/thiamine repletion. --MRI brain, no acute finding  Hyperlipidemia, mixed - Continue statin  Essential hypertension - Continue amlodipine - Transient hypotension overnight - resolved - hold midodrine - continue steroids  Controlled type 2 diabetes mellitus without complication, without long-term current use of insulin (HCC) - A1c 6.5% on 10/02/20 -Transiently elevated with steroids, improving   -Continue sliding scale insulin, hypoglycemic protocol  Hypothyroidism - Continue Synthroid  Personal history of CLL (chronic lymphocytic leukemia) - in remission since 2016 per oncology  - no relapse seen on peripheral smear performed on 11/25/20 "Unremarkable WBC count with   unremarkable WBC morphology. Thrombocytopenia with unremarkable platelet morphology. Findings do  not support CLL relapse." - no findings on BM biopsy on 8/11 but repeat biopsy was recommended as well: "Trilineage hematopoiesis (limited material)"  Pancreatitis, POA-resolved as of 12/11/2020 Unclear etiology -no alcohol use, questionably drug-induced but imaging and symptoms negative for gallstone or obstructive etiology, triglycerides within normal limits  DVT prophylaxis: Place TED hose Start: 11/19/20 2301   Code Status:   Code Status: Full Code Family Communication: Husband/family at bedside  Disposition Plan: Status is: Inpatient  Remains inpatient appropriate because:Inpatient level of care appropriate due to severity of illness  Dispo: The patient is from: Home              Anticipated d/c is to: Home              Patient currently is not medically stable to d/c.   Difficult to place patient No  Risk of unplanned readmission score: Unplanned Admission- Pilot do not use: 53.45   Objective: Blood pressure 111/70, pulse 85, temperature 99.2 F (37.3 C), resp. rate 18, height 6' (1.829 m), weight 94.4 kg, SpO2 99 %.   Examination: General appearance: alert, cooperative, and no distress Head: Normocephalic atraumatic Mouth: no thrush noted; Torus Palatinus noted  Eyes:  EOMI Lungs: clear to auscultation bilaterally Heart: regular rate and rhythm and S1, S2 normal Abdomen: normal findings: bowel sounds normal and soft, non-tender Extremities:  no LE edema; swollen joints noted in ankle, wrist; some edema in fingers Skin:  excess skin noted due to weight loss Neurologic: no focal deficits   Consultants:  Oncology/hematology Rheumatology Nephrology ID  Procedures:  Renal biopsy 12/16/2020  Data Reviewed: I have personally reviewed following labs and imaging studies Results for orders placed or performed during the hospital encounter of 11/19/20 (from the past 24 hour(s))  Glucose, capillary     Status: Abnormal   Collection Time: 12/18/20  7:32 AM  Result Value  Ref Range   Glucose-Capillary 125 (H) 70 - 99 mg/dL  Glucose, capillary     Status: Abnormal   Collection Time: 12/18/20 11:40 AM  Result Value Ref Range   Glucose-Capillary 317 (H) 70 - 99 mg/dL  Glucose, capillary     Status: Abnormal   Collection Time: 12/18/20  6:09 PM  Result Value Ref Range   Glucose-Capillary 159 (H) 70 - 99 mg/dL  Glucose, capillary     Status: Abnormal   Collection Time: 12/18/20  9:06 PM  Result Value Ref Range   Glucose-Capillary 156 (H) 70 - 99 mg/dL    Recent Results (from the past 240 hour(s))  Culture, blood (single) w Reflex to ID Panel     Status: None (Preliminary result)   Collection Time: 12/14/20  2:36 PM   Specimen: BLOOD  Result Value Ref Range Status   Specimen Description BLOOD BLOOD RIGHT HAND  Final   Special Requests   Final    BOTTLES DRAWN AEROBIC AND ANAEROBIC Blood Culture adequate volume   Culture   Final    NO GROWTH 4 DAYS Performed at [facility], [address] [place], [place] [address]    Report Status PENDING  Incomplete  Culture, blood (single) w Reflex to ID Panel     Status: None (Preliminary result)   Collection Time: 12/14/20  3:38 PM   Specimen: BLOOD  Result Value Ref Range Status   Specimen Description BLOOD A-LINE DRAW  Final   Special Requests   Final      BOTTLES DRAWN AEROBIC AND ANAEROBIC Blood Culture adequate volume   Culture   Final    NO GROWTH 4 DAYS Performed at [facility], [address] [place], [place] [address]    Report Status PENDING  Incomplete     Radiology Studies: PERIPHERAL VASCULAR CATHETERIZATION  Result Date: 12/17/2020 See surgical note for result.  US BIOPSY (KIDNEY)  Final Result    US Venous Img Upper Bilat (DVT)  Final Result    US Venous Img Lower Bilateral (DVT)  Final Result    DG Chest 1 View  Final Result    CT BONE MARROW BIOPSY  Final Result    US RENAL  Final Result    MR Lumbar Spine W Wo Contrast  Final Result    MR THORACIC  SPINE W WO CONTRAST  Final Result    MR CERVICAL SPINE W WO CONTRAST  Final Result    MR BRAIN W WO CONTRAST  Final Result    DG Chest Port 1 View  Final Result    US EKG SITE RITE  Final Result    CT HEAD W & WO CONTRAST  Final Result    CT CHEST ABDOMEN PELVIS W CONTRAST  Final Result    DG FL GUIDED LUMBAR PUNCTURE  Final Result    CT Head Wo Contrast  Final Result    DG Chest Portable 1 View  Final Result      Scheduled Meds:  amLODipine  5 mg Oral Daily   Chlorhexidine Gluconate Cloth  6 each Topical Daily   feeding supplement  1 Container Oral TID BM   insulin aspart  0-15 Units Subcutaneous TID WC   insulin aspart  0-5 Units Subcutaneous QHS   levothyroxine  100 mcg Oral QAC breakfast   magnesium oxide  800 mg Oral BID   mouth rinse  15 mL Mouth Rinse BID   midodrine  10 mg Oral TID WC   mirtazapine  15 mg Oral QHS   multivitamin with minerals  1 tablet Oral Daily   pantoprazole  40 mg Oral Daily   pneumococcal 13-valent conjugate vaccine  0.5 mL Intramuscular Tomorrow-1000   psyllium  1 packet Oral Daily   rosuvastatin  10 mg Oral QHS   sodium bicarbonate  650 mg Oral TID   sodium chloride flush  10-40 mL Intracatheter Q12H   thiamine  100 mg Oral Daily   valACYclovir  500 mg Oral Daily   PRN Meds: acetaminophen **OR** acetaminophen, albuterol, haloperidol lactate, HYDROmorphone (DILAUDID) injection, loperamide, ondansetron **OR** ondansetron (ZOFRAN) IV, ondansetron (ZOFRAN) IV, oxyCODONE, polyethylene glycol, sodium chloride flush Continuous Infusions:   LOS: 29 days  Time spent: Greater than 50% of the 35 minute visit was spent in counseling/coordination of care for the patient as laid out in the A&P.    C , DO [facility] 12/19/2020, 7:16 AM  

## 2020-12-19 NOTE — Progress Notes (Signed)
Stepdown transfer for pressors canceled by Dr Sidney Ace Patient bp now 112/65 on dinamap and 132/68 manually. Per MD patient stable to stay in current level of care.

## 2020-12-19 NOTE — Progress Notes (Signed)
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  INTERVAL HISTORY:  Alert, remains weak and fatigued with a poor memory.  Rituxan 1 week ago.  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  REVIEW OF SYSTEMS:   Review of Systems  Constitutional:  Positive for malaise/fatigue. Negative for fever and weight loss.  Respiratory: Negative.  Negative for cough, hemoptysis and shortness of breath.   Cardiovascular: Negative.  Negative for chest pain and leg swelling.  Gastrointestinal: Negative.  Negative for abdominal pain.  Genitourinary: Negative.  Negative for dysuria.  Musculoskeletal: Negative.  Negative for back pain.  Skin: Negative.  Negative for rash.  Neurological:  Positive for weakness. Negative for dizziness, focal weakness and headaches.  Endo/Heme/Allergies:  Does not bruise/bleed easily.  Psychiatric/Behavioral:  Positive for memory loss. The patient is not nervous/anxious.     PAST MEDICAL HISTORY: Past Medical History:  Diagnosis Date   Acute hemorrhoid 03/11/2015   Anxiety    Arthritis    Asthma    Cancer ([place])    Chronic back pain    CLL (chronic lymphocytic leukemia) ([place])    Depression    Diabetes mellitus without complication ([place])    Genital herpes    type 2   Hypertension    Hypothyroidism    Vertigo     PAST SURGICAL HISTORY: Past Surgical History:  Procedure Laterality Date   ABDOMINAL HYSTERECTOMY     BILATERAL SALPINGOOPHORECTOMY  2009   BREAST BIOPSY Right 05/17/2016   FIBROADENOMATOUS CHANGE AND SCLEROSING ADENOSIS WITH   COLONOSCOPY WITH PROPOFOL N/A 06/16/2015   Procedure: COLONOSCOPY WITH PROPOFOL;  Surgeon: [name], MD;   Location: [facility] ENDOSCOPY;  Service: Endoscopy;  Laterality: N/A;   DIALYSIS/PERMA CATHETER INSERTION N/A 12/17/2020   Procedure: DIALYSIS/PERMA CATHETER INSERTION;  Surgeon: Algernon Huxley, MD;  Location: [place] CV LAB;  Service: Cardiovascular;  Laterality: N/A;   LAPAROSCOPIC SUPRACERVICAL HYSTERECTOMY  2009   due to [place] N/A 12/10/2020   Procedure: TEMPORARY DIALYSIS CATHETER;  Surgeon: Katha Cabal, MD;  Location: [place] [facility];  Service: Cardiovascular;  Laterality: N/A;   THORACIC LAMINECTOMY FOR EPIDURAL ABSCESS Bilateral 06/17/2020   Procedure: THORACIC LAMINECTOMY FOR EPIDURAL ABSCESS;  Surgeon: Deetta Perla, MD;  Location: [facility] ORS;  Service: Neurosurgery;  Laterality: Bilateral;    FAMILY HISTORY: Family History  Problem Relation Age of Onset   Cancer Paternal Aunt    Breast cancer Maternal Aunt 16    ADVANCED DIRECTIVES (Y/N):  @ADVDIR @  HEALTH MAINTENANCE: Social History   Tobacco Use   Smoking status: Never   Smokeless tobacco: Never  Vaping Use   Vaping Use: Never used  Substance Use Topics   Alcohol use: Yes    Alcohol/week: 1.0 standard drink    Types: 1 Cans of beer per week   Drug use: No     Colonoscopy:  PAP:  Bone density:  Lipid panel:  Allergies  Allergen Reactions   Amoxapine    Penicillins     Current Facility-Administered Medications  Medication Dose Route Frequency Provider Last Rate Last Admin   0.9 %  sodium chloride infusion (Manually program via Guardrails IV Fluids)   Intravenous Once Little Ishikawa, MD  0.9 %  sodium chloride infusion (Manually program via Guardrails IV Fluids)   Intravenous Once Little Ishikawa, MD       acetaminophen (TYLENOL) tablet 650 mg  650 mg Oral Q6H PRN Algernon Huxley, MD   650 mg at 12/18/20 1621   Or   acetaminophen (TYLENOL) suppository 650 mg  650 mg Rectal Q6H PRN Algernon Huxley, MD       albuterol (PROVENTIL) (2.5 MG/3ML)  0.083% nebulizer solution 3 mL  3 mL Inhalation Q6H PRN Algernon Huxley, MD       amLODipine (NORVASC) tablet 5 mg  5 mg Oral Daily Algernon Huxley, MD   5 mg at 12/19/20 1034   Chlorhexidine Gluconate Cloth 2 % PADS 6 each  6 each Topical Daily Algernon Huxley, MD   6 each at 12/19/20 1036   feeding supplement (BOOST / RESOURCE BREEZE) liquid 1 Container  1 Container Oral TID BM Algernon Huxley, MD   1 Container at 12/19/20 1316   haloperidol lactate (HALDOL) injection 2 mg  2 mg Intravenous Q15 min PRN Algernon Huxley, MD       HYDROmorphone (DILAUDID) injection 1 mg  1 mg Intravenous Once PRN Algernon Huxley, MD       insulin aspart (novoLOG) injection 0-15 Units  0-15 Units Subcutaneous TID WC Algernon Huxley, MD   15 Units at 12/19/20 1257   insulin aspart (novoLOG) injection 0-5 Units  0-5 Units Subcutaneous QHS Algernon Huxley, MD   2 Units at 12/17/20 2114   levothyroxine (SYNTHROID) tablet 100 mcg  100 mcg Oral QAC breakfast Algernon Huxley, MD   100 mcg at 12/19/20 0700   loperamide (IMODIUM) capsule 2 mg  2 mg Oral PRN Algernon Huxley, MD   2 mg at 12/05/20 0949   magnesium oxide (MAG-OX) tablet 800 mg  800 mg Oral BID Algernon Huxley, MD   800 mg at 12/19/20 1033   MEDLINE mouth rinse  15 mL Mouth Rinse BID Algernon Huxley, MD   15 mL at 12/19/20 1036   mirtazapine (REMERON) tablet 15 mg  15 mg Oral QHS Algernon Huxley, MD   15 mg at 12/18/20 2338   multivitamin with minerals tablet 1 tablet  1 tablet Oral Daily Algernon Huxley, MD   1 tablet at 12/19/20 1033   ondansetron (ZOFRAN) tablet 4 mg  4 mg Oral Q6H PRN Algernon Huxley, MD       Or   ondansetron (ZOFRAN) injection 4 mg  4 mg Intravenous Q6H PRN Algernon Huxley, MD   4 mg at 11/23/20 0751   ondansetron (ZOFRAN) injection 4 mg  4 mg Intravenous Q6H PRN Algernon Huxley, MD       oxyCODONE (Oxy IR/ROXICODONE) immediate release tablet 5 mg  5 mg Oral Q4H PRN Algernon Huxley, MD       pantoprazole (PROTONIX) EC tablet 40 mg  40 mg Oral Daily Algernon Huxley, MD   40 mg at 12/19/20 1033    pneumococcal 13-valent conjugate vaccine (PREVNAR 13) injection 0.5 mL  0.5 mL Intramuscular Tomorrow-1000 Dew, Erskine Squibb, MD       polyethylene glycol (MIRALAX / GLYCOLAX) packet 34 g  34 g Oral BID PRN Algernon Huxley, MD   34 g at 12/13/20 0814   predniSONE (DELTASONE) tablet 80 mg  80 mg Oral Q breakfast Kolluru, Sarath, MD   80 mg at 12/19/20 1314  psyllium (HYDROCIL/METAMUCIL) 1 packet  1 packet Oral Daily Algernon Huxley, MD   1 packet at 12/10/20 1042   rosuvastatin (CRESTOR) tablet 10 mg  10 mg Oral QHS Algernon Huxley, MD   10 mg at 12/18/20 2336   sodium bicarbonate tablet 650 mg  650 mg Oral TID Algernon Huxley, MD   650 mg at 12/19/20 1033   sodium chloride flush (NS) 0.9 % injection 10-40 mL  10-40 mL Intracatheter Q12H Algernon Huxley, MD   10 mL at 12/19/20 1037   sodium chloride flush (NS) 0.9 % injection 10-40 mL  10-40 mL Intracatheter PRN Algernon Huxley, MD   10 mL at 12/19/20 0700   thiamine tablet 100 mg  100 mg Oral Daily Algernon Huxley, MD   100 mg at 12/19/20 1033   valACYclovir (VALTREX) tablet 500 mg  500 mg Oral Daily Algernon Huxley, MD   500 mg at 12/18/20 1823    OBJECTIVE: Vitals:   12/19/20 1635 12/19/20 1651  BP:  128/72  Pulse:  79  Resp: 18 18  Temp:  98.9 F (37.2 C)  SpO2:  100%     Body mass index is 28.23 kg/m.    ECOG FS:2 - Symptomatic, <50% confined to bed  General: Well-developed, well-nourished, no acute distress. Eyes: Pink conjunctiva, anicteric sclera. HEENT: Normocephalic, moist mucous membranes. Lungs: No audible wheezing or coughing. Heart: Regular rate and rhythm. Abdomen: Soft, nontender, no obvious distention. Musculoskeletal: No edema, cyanosis, or clubbing. Neuro: Alert, answering all questions appropriately. Cranial nerves grossly intact. Skin: No rashes or petechiae noted. Psych: Normal affect.   LAB RESULTS:  Lab Results  Component Value Date   NA 134 (L) 12/19/2020   K 3.5 12/19/2020   CL 100 12/19/2020   CO2 28 12/19/2020   GLUCOSE 197  (H) 12/19/2020   BUN 26 (H) 12/19/2020   CREATININE 3.88 (H) 12/19/2020   CALCIUM 6.8 (L) 12/19/2020   PROT 4.6 (L) 12/19/2020   ALBUMIN 1.9 (L) 12/19/2020   AST 56 (H) 12/19/2020   ALT 10 12/19/2020   ALKPHOS 77 12/19/2020   BILITOT 1.1 12/19/2020   GFRNONAA 13 (L) 12/19/2020   GFRAA >60 01/17/2020    Lab Results  Component Value Date   WBC 5.2 12/19/2020   NEUTROABS 3.4 12/19/2020   HGB 6.3 (L) 12/19/2020   HCT 18.5 (L) 12/19/2020   MCV 85.6 12/19/2020   PLT 33 (L) 12/19/2020     STUDIES: DG Chest 1 View  Result Date: 12/04/2020 CLINICAL DATA:  Cough EXAM: CHEST 1 VIEW COMPARISON:  11/27/2020 FINDINGS: Few faint residual patchy opacities are present in the right mid lung. There is persistent basilar atelectasis as well. Asymmetric elevation of the right hemidiaphragm similar to prior. New left effusion. No right effusion. No pneumothorax. Stable cardiomediastinal contours. Right upper extremity PICC tip terminates at the superior cavoatrial junction. No acute osseous or soft tissue abnormality of the chest wall IMPRESSION: Few faint residual patchy opacities present in the right mid lung, possibly infectious or inflammatory as seen on CT and prior radiograph. New trace left effusion. Right upper extremity PICC terminates at the superior cavoatrial junction. Electronically Signed   By: Lovena Le M.D.   On: 12/04/2020 19:30   CT HEAD W & WO CONTRAST  Result Date: 11/21/2020 CLINICAL DATA:  Metastatic disease evaluation. Additional provided: Patient with history of recent pneumococcal meningitis and bacteremia, diabetes mellitus presenting with altered mental status. EXAM: CT HEAD WITHOUT AND  WITH CONTRAST TECHNIQUE: Contiguous axial images were obtained from the base of the skull through the vertex without and with intravenous contrast CONTRAST:  165m OMNIPAQUE IOHEXOL 350 MG/ML SOLN COMPARISON:  Prior head CT examinations 11/19/2020 and earlier. FINDINGS: Brain: Cerebral volume  is normal for age. There is no acute intracranial hemorrhage. No demarcated cortical infarct. No extra-axial fluid collection. No evidence of an intracranial mass. No midline shift. No pathologic intracranial enhancement is identified. Vascular: Atherosclerotic calcifications. Skull: Normal. Negative for fracture or focal suspicious osseous lesion. Sinuses/Orbits: Visualized orbits show no acute finding. Minimal frothy secretions within the left sphenoid sinus. Small right sphenoid sinus mucous retention cyst. Minimal partial opacification of the posterior right ethmoid air cells. IMPRESSION: No evidence of acute intracranial abnormality. No CT evidence of intracranial metastatic disease. Mild paranasal sinus disease at the imaged levels, as described. Electronically Signed   By: KKellie SimmeringDO   On: 11/21/2020 15:42   MR BRAIN W WO CONTRAST  Result Date: 11/28/2020 CLINICAL DATA:  Mental status change, unknown cause; encephalopathy of unknown cause; recurrent fever. EXAM: MRI HEAD WITHOUT AND WITH CONTRAST TECHNIQUE: Multiplanar, multiecho pulse sequences of the brain and surrounding structures were obtained without and with intravenous contrast. CONTRAST:  8575mGADAVIST GADOBUTROL 1 MMOL/ML IV SOLN COMPARISON:  Head CT November 21, 2020 FINDINGS: Brain: Bilateral T2 hyperintense subdural fluid collections measuring up to 5 mm on the right and 6 mm on the left with mild mass effect on the adjacent brain parenchyma without midline shift. Postcontrast images are severely degraded by motion. Mild diffuse pachymeningeal contrast enhancement seen. The brain parenchyma has normal morphology and signal characteristics. No acute infarction, hemorrhage, hydrocephalus, or mass lesion. Vascular: Normal flow voids. Skull and upper cervical spine: Normal marrow signal. Sinuses/Orbits: Negative. Other: None. IMPRESSION: Interval development of small bilateral subdural fluid collections, may be related to recent lumbar puncture.  Electronically Signed   By: KaPedro Earls.D.   On: 11/28/2020 14:54   MR CERVICAL SPINE W WO CONTRAST  Result Date: 11/28/2020 CLINICAL DATA:  History of epidural abscess and paraspinal abscess in the setting of recurrent fever and altered mental status. EXAM: MRI CERVICAL, THORACIC AND LUMBAR SPINE WITHOUT AND WITH CONTRAST TECHNIQUE: Multiplanar and multiecho pulse sequences of the cervical spine, to include the craniocervical junction and cervicothoracic junction, and thoracic and lumbar spine, were obtained without and with intravenous contrast. CONTRAST:  75m56mADAVIST GADOBUTROL 1 MMOL/ML IV SOLN COMPARISON:  MRI of the cervical, thoracic and lumbar spine June 17, 2020. FINDINGS: The study is partially degraded by motion. Postcontrast axial images are essentially nondiagnostic. MRI CERVICAL SPINE FINDINGS Alignment: Physiologic. Vertebrae: No fracture, evidence of discitis, or bone lesion. Cord: Increased T2 signal within the cord at the C3 level. Posterior Fossa, vertebral arteries, paraspinal tissues: Negative. Disc levels: C2-3: Posterior disc protrusion causing mild mass effect on the cord without significant spinal canal stenosis. Facet degenerative changes resulting mild left neural foraminal narrowing. C3-4: Left posterolateral disc protrusion causing moderate to severe spinal canal stenosis with mass effect on the cord. Increased T2 signal is seen within the cord immediately above the level of compression, likely edema. Uncovertebral and facet degenerative changes resulting in moderate right and severe left neural foraminal narrowing. C4-5: Posterior disc protrusion asymmetric to the left causing mass effect on the cord and resulting in moderate spinal canal stenosis. Uncovertebral and facet degenerative changes resulting in severe bilateral neural foraminal narrowing. C5-6: Posterior disc osteophyte complex causing mass effect on the  cord and resulting in moderate spinal canal  stenosis. Uncovertebral and facet degenerative changes resulting severe bilateral neural foraminal narrowing. C6-7: Left posterolateral disc protrusion causing indentation of the thecal sac and resulting mild spinal canal stenosis. Uncovertebral and facet degenerative changes resulting in mild bilateral neural foraminal narrowing. C7-T1: Left posterolateral disc protrusion causing indentation on the thecal sac and resulting in mild left neural foraminal narrowing. No significant spinal canal stenosis. MRI THORACIC SPINE FINDINGS Alignment:  Physiologic. Vertebrae: No fracture, evidence of discitis, or bone lesion. Postsurgical changes from T3-4, T5-7 and T9-10 laminectomies. Interval resolution of the large posterior epidural fluid collection. Cord: Central increased T2 signal within the cord at T6-7, T7-8 and from the T8-9 level to the conus medullaris. Paraspinal and other soft tissues: Small bilateral pleural effusion. A 1.5 cm T2 hyperintense hepatic lesion. Disc levels: Tiny posterior disc protrusions at T3-4, T5-6, T6-7 and T7-8 causing minimal indentation on the thecal sac. No significant spinal canal or neural foraminal stenosis at any thoracic level. MRI LUMBAR SPINE FINDINGS Segmentation:  Standard. Alignment:  Physiologic. Vertebrae: No fracture, evidence of discitis, or bone lesion. Endplate degenerative changes at L5-S1. Conus medullaris and cauda equina: Conus extends to the L1-2 level. Increased T2 signal within the conus medullaris, as described above. 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 Paraspinal and other soft tissues: Negative. Disc levels: T12-L1: No spinal canal or neural foraminal stenosis. L1-2: No spinal canal or neural foraminal stenosis. L2-3: Shallow disc bulge and moderate facet degenerative changes without significant spinal canal or neural foraminal stenosis. L3-4: Moderate facet degenerative changes. No spinal  canal or neural foraminal stenosis. L4-5: Mild loss of disc height, disc bulge and moderate facet degenerative changes without significant spinal canal or neural foraminal stenosis. L5-S1: Loss of disc height, disc bulge with superimposed left central/subarticular disc protrusion and moderate facet degenerative changes. Findings result in narrowing of the left subarticular zone with displacement of the traversing left S1 nerve root. No significant neural foraminal narrowing. IMPRESSION: 1. The study is degraded by motion, particularly the postcontrast images. 2. Interval resolution of the large posterior thoracic epidural fluid collection with postsurgical changes from multilevel laminectomy noted in the thoracic spine. No new fluid collection identified. 3. Increased T2 signal within the cord centrally at T6-7, T7-8 and from the T8-9 level to the conus medullaris without cord expansion. This may represent post ischemic changes related to now resolved cord compression. 4. 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 5. 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 6. Multilevel high-grade neural foraminal narrowing at the cervical spine. 7. Mild degenerative changes of the thoracic spine without spinal canal or neural foraminal stenosis. 8. Degenerative disc disease at L5-S1 with narrowing of the left subarticular zone and likely impingement of the traversing left S1 nerve root. Electronically Signed   By: Pedro Earls M.D.   On: 11/28/2020 15:41   MR THORACIC SPINE W WO CONTRAST  Result Date: 11/28/2020 CLINICAL DATA:  History of epidural abscess and paraspinal abscess in the setting of recurrent fever and altered mental status. EXAM: MRI CERVICAL, THORACIC AND LUMBAR SPINE WITHOUT AND WITH CONTRAST TECHNIQUE: Multiplanar and multiecho  pulse sequences of the cervical spine, to include the craniocervical junction and cervicothoracic junction, and thoracic and lumbar spine, were  obtained without and with intravenous contrast. CONTRAST:  17m GADAVIST GADOBUTROL 1 MMOL/ML IV SOLN COMPARISON:  MRI of the cervical, thoracic and lumbar spine June 17, 2020. FINDINGS: The study is partially degraded by motion. Postcontrast axial images are essentially nondiagnostic. MRI CERVICAL SPINE FINDINGS Alignment: Physiologic. Vertebrae: No fracture, evidence of discitis, or bone lesion. Cord: Increased T2 signal within the cord at the C3 level. Posterior Fossa, vertebral arteries, paraspinal tissues: Negative. Disc levels: C2-3: Posterior disc protrusion causing mild mass effect on the cord without significant spinal canal stenosis. Facet degenerative changes resulting mild left neural foraminal narrowing. C3-4: Left posterolateral disc protrusion causing moderate to severe spinal canal stenosis with mass effect on the cord. Increased T2 signal is seen within the cord immediately above the level of compression, likely edema. Uncovertebral and facet degenerative changes resulting in moderate right and severe left neural foraminal narrowing. C4-5: Posterior disc protrusion asymmetric to the left causing mass effect on the cord and resulting in moderate spinal canal stenosis. Uncovertebral and facet degenerative changes resulting in severe bilateral neural foraminal narrowing. C5-6: Posterior disc osteophyte complex causing mass effect on the cord and resulting in moderate spinal canal stenosis. Uncovertebral and facet degenerative changes resulting severe bilateral neural foraminal narrowing. C6-7: Left posterolateral disc protrusion causing indentation of the thecal sac and resulting mild spinal canal stenosis. Uncovertebral and facet degenerative changes resulting in mild bilateral neural foraminal narrowing. C7-T1: Left posterolateral disc protrusion causing  indentation on the thecal sac and resulting in mild left neural foraminal narrowing. No significant spinal canal stenosis. MRI THORACIC SPINE FINDINGS Alignment:  Physiologic. Vertebrae: No fracture, evidence of discitis, or bone lesion. Postsurgical changes from T3-4, T5-7 and T9-10 laminectomies. Interval resolution of the large posterior epidural fluid collection. Cord: Central increased T2 signal within the cord at T6-7, T7-8 and from the T8-9 level to the conus medullaris. Paraspinal and other soft tissues: Small bilateral pleural effusion. A 1.5 cm T2 hyperintense hepatic lesion. Disc levels: Tiny posterior disc protrusions at T3-4, T5-6, T6-7 and T7-8 causing minimal indentation on the thecal sac. No significant spinal canal or neural foraminal stenosis at any thoracic level. MRI LUMBAR SPINE FINDINGS Segmentation:  Standard. Alignment:  Physiologic. Vertebrae: No fracture, evidence of discitis, or bone lesion. Endplate degenerative changes at L5-S1. Conus medullaris and cauda equina: Conus extends to the L1-2 level. Increased T2 signal within the conus medullaris, as described above. 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 Paraspinal and other soft tissues: Negative. Disc levels: T12-L1: No spinal canal or neural foraminal stenosis. L1-2: No spinal canal or neural foraminal stenosis. L2-3: Shallow disc bulge and moderate facet degenerative changes without significant spinal canal or neural foraminal stenosis. L3-4: Moderate facet degenerative changes. No spinal canal or neural foraminal stenosis. L4-5: Mild loss of disc height, disc bulge and moderate facet degenerative changes without significant spinal canal or neural foraminal stenosis. L5-S1: Loss of disc height, disc bulge with superimposed left central/subarticular disc protrusion and moderate facet degenerative changes. Findings result in narrowing of the left subarticular zone with  displacement of the traversing left S1 nerve root. No significant neural foraminal narrowing. IMPRESSION: 1. The study is degraded by motion, particularly the postcontrast images. 2. Interval resolution of the large posterior thoracic epidural fluid collection with postsurgical changes from multilevel laminectomy noted in the thoracic spine. No new fluid collection identified. 3. Increased T2 signal within the cord centrally at T6-7, T7-8 and from the T8-9 level to  the conus medullaris without cord expansion. This may represent post ischemic changes related to now resolved cord compression. 4. 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 5. 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 6. Multilevel high-grade neural foraminal narrowing at the cervical spine. 7. Mild degenerative changes of the thoracic spine without spinal canal or neural foraminal stenosis. 8. Degenerative disc disease at L5-S1 with narrowing of the left subarticular zone and likely impingement of the traversing left S1 nerve root. Electronically Signed   By: Pedro Earls M.D.   On: 11/28/2020 15:41   MR Lumbar Spine W Wo Contrast  Result Date: 11/28/2020 CLINICAL DATA:  History of epidural abscess and paraspinal abscess in the setting of recurrent fever and altered mental status. EXAM: MRI CERVICAL, THORACIC AND LUMBAR SPINE WITHOUT AND WITH CONTRAST TECHNIQUE: Multiplanar and multiecho pulse sequences of the cervical spine, to include the craniocervical junction and cervicothoracic junction, and thoracic and lumbar spine, were obtained without and with intravenous contrast. CONTRAST:  60m GADAVIST GADOBUTROL 1 MMOL/ML IV SOLN COMPARISON:  MRI of the cervical, thoracic and lumbar spine June 17, 2020. FINDINGS: The study is partially degraded by motion.  Postcontrast axial images are essentially nondiagnostic. MRI CERVICAL SPINE FINDINGS Alignment: Physiologic. Vertebrae: No fracture, evidence of discitis, or bone lesion. Cord: Increased T2 signal within the cord at the C3 level. Posterior Fossa, vertebral arteries, paraspinal tissues: Negative. Disc levels: C2-3: Posterior disc protrusion causing mild mass effect on the cord without significant spinal canal stenosis. Facet degenerative changes resulting mild left neural foraminal narrowing. C3-4: Left posterolateral disc protrusion causing moderate to severe spinal canal stenosis with mass effect on the cord. Increased T2 signal is seen within the cord immediately above the level of compression, likely edema. Uncovertebral and facet degenerative changes resulting in moderate right and severe left neural foraminal narrowing. C4-5: Posterior disc protrusion asymmetric to the left causing mass effect on the cord and resulting in moderate spinal canal stenosis. Uncovertebral and facet degenerative changes resulting in severe bilateral neural foraminal narrowing. C5-6: Posterior disc osteophyte complex causing mass effect on the cord and resulting in moderate spinal canal stenosis. Uncovertebral and facet degenerative changes resulting severe bilateral neural foraminal narrowing. C6-7: Left posterolateral disc protrusion causing indentation of the thecal sac and resulting mild spinal canal stenosis. Uncovertebral and facet degenerative changes resulting in mild bilateral neural foraminal narrowing. C7-T1: Left posterolateral disc protrusion causing indentation on the thecal sac and resulting in mild left neural foraminal narrowing. No significant spinal canal stenosis. MRI THORACIC SPINE FINDINGS Alignment:  Physiologic. Vertebrae: No fracture, evidence of discitis, or bone lesion. Postsurgical changes from T3-4, T5-7 and T9-10 laminectomies. Interval resolution of the large posterior epidural fluid collection. Cord:  Central increased T2 signal within the cord at T6-7, T7-8 and from the T8-9 level to the conus medullaris. Paraspinal and other soft tissues: Small bilateral pleural effusion. A 1.5 cm T2 hyperintense hepatic lesion. Disc levels: Tiny posterior disc protrusions at T3-4, T5-6, T6-7 and T7-8 causing minimal indentation on the thecal sac. No significant spinal canal or neural foraminal stenosis at any thoracic level. MRI LUMBAR SPINE FINDINGS Segmentation:  Standard. Alignment:  Physiologic. Vertebrae: No fracture, evidence of discitis, or bone lesion. Endplate degenerative changes at L5-S1. Conus medullaris and cauda equina: Conus extends to the L1-2 level. Increased T2 signal within  the conus medullaris, as described above. 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 Paraspinal and other soft tissues: Negative. Disc levels: T12-L1: No spinal canal or neural foraminal stenosis. L1-2: No spinal canal or neural foraminal stenosis. L2-3: Shallow disc bulge and moderate facet degenerative changes without significant spinal canal or neural foraminal stenosis. L3-4: Moderate facet degenerative changes. No spinal canal or neural foraminal stenosis. L4-5: Mild loss of disc height, disc bulge and moderate facet degenerative changes without significant spinal canal or neural foraminal stenosis. L5-S1: Loss of disc height, disc bulge with superimposed left central/subarticular disc protrusion and moderate facet degenerative changes. Findings result in narrowing of the left subarticular zone with displacement of the traversing left S1 nerve root. No significant neural foraminal narrowing. IMPRESSION: 1. The study is degraded by motion, particularly the postcontrast images. 2. Interval resolution of the large posterior thoracic epidural fluid collection with postsurgical changes from multilevel laminectomy noted in the thoracic spine. No new fluid collection  identified. 3. Increased T2 signal within the cord centrally at T6-7, T7-8 and from the T8-9 level to the conus medullaris without cord expansion. This may represent post ischemic changes related to now resolved cord compression. 4. 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 5. 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 6. Multilevel high-grade neural foraminal narrowing at the cervical spine. 7. Mild degenerative changes of the thoracic spine without spinal canal or neural foraminal stenosis. 8. Degenerative disc disease at L5-S1 with narrowing of the left subarticular zone and likely impingement of the traversing left S1 nerve root. Electronically Signed   By: Pedro Earls M.D.   On: 11/28/2020 15:41   US RENAL  Result Date: 12/01/2020 CLINICAL DATA:  Acute kidney injury, history CLL, diabetes mellitus, hypertension EXAM: RENAL / URINARY TRACT ULTRASOUND COMPLETE COMPARISON:  CT abdomen and pelvis 11/21/2020 FINDINGS: Right Kidney: Renal measurements: 11.4 x 5.8 x 6.0 cm = volume: 206 mL. Normal cortical thickness. Increased cortical echogenicity. No mass, hydronephrosis, or shadowing calcification. Left Kidney: Renal measurements: 13.1 x 7.3 x 6.1 cm = volume: 3 L4 mL. Normal cortical thickness. Increased cortical echogenicity. Tiny cyst at mid kidney 9 x 13 x 13 mm, simple features. No additional mass, hydronephrosis, or shadowing calcification. Bladder: Partially distended. Cystic intraluminal focus at the posterior wall RIGHT of midline question RIGHT ureterocele approximately 16 x 6 x 9 mm in size. Other: N/A IMPRESSION: Medical renal disease changes of both kidneys. Intraluminal cystic focus posterior RIGHT bladder, favor representing a small RIGHT ureterocele. Electronically Signed   By: Lavonia Dana M.D.   On: 12/01/2020  16:26   CT CHEST ABDOMEN PELVIS W CONTRAST  Result Date: 11/21/2020 CLINICAL DATA:  Altered mental status. Recent history of pneumococcal meningitis and bacteremia. Coffee ground emesis. EXAM: CT CHEST, ABDOMEN, AND PELVIS WITH CONTRAST TECHNIQUE: Multidetector CT imaging of the chest, abdomen and pelvis was performed following the standard protocol during bolus administration of intravenous contrast. CONTRAST:  100 mL OMNIPAQUE IOHEXOL 350 MG/ML SOLN COMPARISON:  CT abdomen and pelvis 05/23/2020. FINDINGS: CT CHEST FINDINGS Cardiovascular: There is mild cardiomegaly and a small pericardial effusion. No atherosclerosis is seen. Mediastinum/Nodes: Innumerable axillary and subpectoral lymph nodes are new since the prior chest CT. Index node in the right axilla measuring 1 cm short axis  dimension is seen on image 21 of series 3. A left subpectoral node measuring 1.3 cm is seen on image 24. Small mediastinal lymph nodes include a precarinal node on image 27 measuring 1 cm. The esophagus and thyroid are negative. Lungs/Pleura: There is some dependent atelectasis. A 0.7 x 0.5 cm right upper lobe nodule on image 72 of series 6 measure approximately 0.7 x 0.4 cm on the prior exam. There is extensive micronodularity in the right upper lobe. Musculoskeletal: No acute or focal abnormality CT ABDOMEN PELVIS FINDINGS Hepatobiliary: No focal liver abnormality is seen. No gallstones, gallbladder wall thickening, or biliary dilatation. The liver is diffusely low attenuating consistent with marked fatty infiltration. Pancreas: Vanessa Prince Frederick is present about the pancreas throughout is consistent with acute pancreatitis. No focal fluid collection. The pancreas enhances homogeneously. Spleen: Normal in size without focal abnormality. Adrenals/Urinary Tract: The adrenal glands appear normal. A single small cyst is seen in the left kidney. On delayed imaging, there are areas of decreased attenuation in both kidneys which may be  artifactual. No hydronephrosis. No ureteral or urinary bladder stone. Stomach/Bowel: Stomach is within normal limits. Appendix appears normal. No evidence of bowel wall thickening, distention, or inflammatory changes. Vascular/Lymphatic: No significant vascular findings are present. No enlarged abdominal or pelvic lymph nodes. Reproductive: Status post hysterectomy. No adnexal masses. Other: None. Musculoskeletal: No acute or focal bony abnormality. IMPRESSION: The examination is positive for findings consistent with acute pancreatitis without pseudocyst formation or pancreatic necrosis. Micronodularity in the right upper lobe is worrisome for bronchopneumonia. 0.7 x 0.5 cm right upper lobe nodule has mildly increased in size since 2017. Non-contrast chest CT at 6-12 months is recommended. If the nodule is stable at time of repeat CT, then future CT at 18-24 months (from today's scan) is considered optional for low-risk patients, but is recommended for high-risk patients. This recommendation follows the consensus statement: Guidelines for Management of Incidental Pulmonary Nodules Detected on CT Images: From the Fleischner Society 2017; Radiology 2017; 284:228-243. Multiple axillary and subpectoral lymph nodes are abnormal in number and likely related to this patient's history of CLL. Cardiomegaly and small pericardial effusion. Marked fatty infiltration of the liver. Decreased cortical attenuation the kidneys is likely artifactual. Correlation with urinalysis could be used to exclude pyelonephritis. Electronically Signed   By: Inge Rise M.D.   On: 11/21/2020 16:55   PERIPHERAL VASCULAR CATHETERIZATION  Result Date: 12/17/2020 See surgical note for result.  PERIPHERAL VASCULAR CATHETERIZATION  Result Date: 12/10/2020 See surgical note for result.  US Venous Img Lower Bilateral (DVT)  Result Date: 12/05/2020 CLINICAL DATA:  Fever unknown origin EXAM: BILATERAL LOWER EXTREMITY VENOUS DOPPLER  ULTRASOUND TECHNIQUE: Gray-scale sonography with compression, as well as color and duplex ultrasound, were performed to evaluate the deep venous system(s) from the level of the common femoral vein through the popliteal and proximal calf veins. COMPARISON:  None. FINDINGS: VENOUS On the left, normal compressibility of the common femoral, superficial femoral, and popliteal veins, as well as the visualized calf veins. Visualized portions of profunda femoral vein and great saphenous vein unremarkable. No filling defects to suggest DVT on grayscale or color Doppler imaging. Doppler waveforms show normal direction of venous flow, normal respiratory phasicity and response to augmentation. On the right, incomplete compressibility of the proximal popliteal vein with eccentric mural thickening containing linear echogenic regions. There is continued patency of the lumen with flow seen on color Doppler. Common femoral, femoral, and visualized calf veins are unremarkable. OTHER None. Limitations: none IMPRESSION: 1.  Negative for acute DVT. 2. 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 Electronically Signed   By: Lucrezia Europe M.D.   On: 12/05/2020 07:25   US Venous Img Upper Bilat (DVT)  Result Date: 12/05/2020 CLINICAL DATA:  Fever of unknown origin, diabetes, numbness EXAM: BILATERAL UPPER EXTREMITY VENOUS DOPPLER ULTRASOUND TECHNIQUE: Gray-scale sonography with graded compression, as well as color Doppler and duplex ultrasound were performed to evaluate the bilateral upper extremity deep venous systems from the level of the subclavian vein and including the jugular, axillary, basilic, radial, ulnar and upper cephalic vein. Spectral Doppler was utilized to evaluate flow at rest and with distal augmentation maneuvers. COMPARISON:  None. FINDINGS: RIGHT UPPER EXTREMITY Internal Jugular Vein: No evidence of thrombus. Normal compressibility, respiratory phasicity and response  to augmentation. Subclavian Vein: No evidence of thrombus. Normal compressibility, respiratory phasicity and response to augmentation. Axillary Vein: No evidence of thrombus. Normal compressibility, respiratory phasicity and response to augmentation. Cephalic Vein: Not visualized Basilic Vein: No evidence of thrombus. Normal compressibility, respiratory phasicity and response to augmentation. Brachial Veins: No evidence of thrombus. Normal compressibility, respiratory phasicity and response to augmentation. Radial Veins: No evidence of thrombus. Normal compressibility, respiratory phasicity and response to augmentation. Ulnar Veins: No evidence of thrombus. Normal compressibility, respiratory phasicity and response to augmentation. Venous Reflux:  None. Other Findings:  None. LEFT UPPER EXTREMITY Internal Jugular Vein: No evidence of thrombus. Normal compressibility, respiratory phasicity and response to augmentation. Subclavian Vein: No evidence of thrombus. Normal compressibility, respiratory phasicity and response to augmentation. Axillary Vein: No evidence of thrombus. Normal compressibility, respiratory phasicity and response to augmentation. Cephalic Vein: Not visualized Basilic Vein: No evidence of thrombus. Normal compressibility, respiratory phasicity and response to augmentation. Brachial Veins: No evidence of thrombus. Normal compressibility, respiratory phasicity and response to augmentation. Radial Veins: No evidence of thrombus. Normal compressibility, respiratory phasicity and response to augmentation. Ulnar Veins: No evidence of thrombus. Normal compressibility, respiratory phasicity and response to augmentation. Venous Reflux:  None. Other Findings:  None. IMPRESSION: No evidence of DVT within either upper extremity. Electronically Signed   By: Lucrezia Europe M.D.   On: 12/05/2020 07:28   DG Chest Port 1 View  Result Date: 11/27/2020 CLINICAL DATA:  Fever and chest pain. EXAM: PORTABLE CHEST 1 VIEW  COMPARISON:  And CT chest, abdomen and pelvis 11/21/2020. Single-view of the chest 10/01/2020. FINDINGS: Subtle micro nodules are seen in the right upper lobe. The left lung demonstrates minimal atelectasis in the base. Heart size is normal. No pneumothorax or pleural fluid. No acute or focal bony abnormality. IMPRESSION: Subtle focus of mild micronodularity in the right upper lobe compatible with residual infectious or inflammatory process as seen on prior CT. Recommend continued follow-up to clearing. Electronically Signed   By: Inge Rise M.D.   On: 11/27/2020 11:36   EEG adult  Result Date: 11/25/2020 Lora Havens, MD     11/25/2020  5:08 PM Patient Name: Mariene Dickerman MRN: [id number] Epilepsy Attending: Lora Havens Referring Physician/Provider: Dr Rosalin Hawking Date: 11/25/2020 Duration: 31.44 mins Patient history: 55yo F with ams. EEG to evaluate for seizure Level of alertness: Awake AEDs during EEG study: None Technical aspects: This EEG study was done with scalp electrodes positioned according to the 10-20 International system of electrode placement. Electrical activity was acquired at a sampling rate of 500Hz  and reviewed with a high frequency filter of 70Hz  and a low frequency filter of 1Hz . EEG data were recorded continuously  and digitally stored. Description: No posterior dominant rhythm was seen. EEG showed continuous generalized 3 to 6 Hz theta-delta slowing. Physiologic photic driving was not seen during photic stimulation.  Hyperventilation was not performed.   ABNORMALITY - Continuous slow, generalized IMPRESSION: This study is suggestive of moderate diffuse encephalopathy, nonspecific etiology. No seizures or epileptiform discharges were seen throughout the recording. Priyanka [name]   CT BONE MARROW BIOPSY  Result Date: 12/04/2020 CLINICAL DATA:  Chronic lymphocytic leukemia EXAM: CT GUIDED DEEP ILIAC BONE ASPIRATION AND CORE BIOPSY TECHNIQUE: Patient was placed prone on the CT  gantry and limited axial scans through the pelvis were obtained. Appropriate skin entry site was identified. Skin site was marked, prepped with chlorhexidine, draped in usual sterile fashion, and infiltrated locally with 1% lidocaine. Patient was given Versed 2 mg IV for anxiolysis during continuous cardiorespiratory monitoring by the radiology RN, during the procedure time of 11 minutes. Under CT fluoroscopic guidance an 11-gauge Cook trocar bone needle was advanced into the right iliac bone just lateral to the sacroiliac joint. Once needle tip position was confirmed, aspiration and 2 core samples were obtained, submitted to pathology for approval. Post procedure scans show no hematoma or fracture. Patient tolerated procedure well. COMPLICATIONS: COMPLICATIONS none IMPRESSION: 1. Technically successful CT guided right iliac bone core and aspiration biopsy. Electronically Signed   By: Lucrezia Europe M.D.   On: 12/04/2020 15:53   ECHOCARDIOGRAM COMPLETE  Result Date: 12/05/2020    ECHOCARDIOGRAM REPORT   Patient Name:   AMIRIA ORRISON Date of Exam: 12/05/2020 Medical Rec #:  [id number]       Height:       72.0 in Accession #:    [id number]      Weight:       208.1 lb Date of Birth:  February 28, 1966       BSA:          2.167 m Patient Age:    55 years        BP:           136/71 mmHg Patient Gender: F               HR:           80 bpm. Exam Location:  [facility] Procedure: 2D Echo, Color Doppler and Cardiac Doppler Indications:     R50.9 Fever  History:         Patient has prior history of Echocardiogram examinations, most                  recent 06/11/2020. Signs/Symptoms:Fever; Risk                  Factors:Hypertension and Diabetes. Asthma.  Sonographer:     Charmayne Sheer Referring Phys:  [id number] [name] Diagnosing Phys: Yolonda Kida MD  Sonographer Comments: Suboptimal apical window. IMPRESSIONS  1. Left ventricular ejection fraction, by estimation, is 60 to 65%. The left ventricle has normal function. The  left ventricle has no regional wall motion abnormalities. Left ventricular diastolic parameters were normal.  2. Right ventricular systolic function is normal. The right ventricular size is normal. Mildly increased right ventricular wall thickness.  3. The mitral valve is normal in structure. No evidence of mitral valve regurgitation.  4. The aortic valve is normal in structure. Aortic valve regurgitation is not visualized. FINDINGS  Left Ventricle: Left ventricular ejection fraction, by estimation, is 60 to 65%. The left ventricle has normal function. The left ventricle has  no regional wall motion abnormalities. The left ventricular internal cavity size was normal in size. There is  borderline concentric left ventricular hypertrophy. Left ventricular diastolic parameters were normal. Right Ventricle: The right ventricular size is normal. Mildly increased right ventricular wall thickness. Right ventricular systolic function is normal. Left Atrium: Left atrial size was normal in size. Right Atrium: Right atrial size was normal in size. Pericardium: There is no evidence of pericardial effusion. Mitral Valve: The mitral valve is normal in structure. No evidence of mitral valve regurgitation. MV peak gradient, 2.6 mmHg. The mean mitral valve gradient is 1.0 mmHg. Tricuspid Valve: The tricuspid valve is normal in structure. Tricuspid valve regurgitation is not demonstrated. Aortic Valve: The aortic valve is normal in structure. Aortic valve regurgitation is not visualized. Aortic valve mean gradient measures 7.0 mmHg. Aortic valve peak gradient measures 12.5 mmHg. Aortic valve area, by VTI measures 2.59 cm. Pulmonic Valve: The pulmonic valve was normal in structure. Pulmonic valve regurgitation is not visualized. Aorta: The ascending aorta was not well visualized. IAS/Shunts: No atrial level shunt detected by color flow Doppler.  LEFT VENTRICLE PLAX 2D LVIDd:         4.91 cm  Diastology LVIDs:         3.33 cm  LV e'  medial:    6.85 cm/s LV PW:         0.94 cm  LV E/e' medial:  10.7 LV IVS:        0.77 cm  LV e' lateral:   7.62 cm/s LVOT diam:     2.10 cm  LV E/e' lateral: 9.6 LV SV:         73 LV SV Index:   34 LVOT Area:     3.46 cm  LEFT ATRIUM             Index LA diam:        3.50 cm 1.62 cm/m LA Vol (A2C):   42.9 ml 19.80 ml/m LA Vol (A4C):   56.8 ml 26.21 ml/m LA Biplane Vol: 51.6 ml 23.81 ml/m  AORTIC VALVE                    PULMONIC VALVE AV Area (Vmax):    2.29 cm     PV Vmax:       1.40 m/s AV Area (Vmean):   2.48 cm     PV Vmean:      92.300 cm/s AV Area (VTI):     2.59 cm     PV VTI:        0.187 m AV Vmax:           177.00 cm/s  PV Peak grad:  7.8 mmHg AV Vmean:          118.000 cm/s PV Mean grad:  4.0 mmHg AV VTI:            0.284 m AV Peak Grad:      12.5 mmHg AV Mean Grad:      7.0 mmHg LVOT Vmax:         117.00 cm/s LVOT Vmean:        84.600 cm/s LVOT VTI:          0.212 m LVOT/AV VTI ratio: 0.75  AORTA Ao Root diam: 2.80 cm MITRAL VALVE MV Area (PHT): 2.74 cm    SHUNTS MV Area VTI:   3.16 cm    Systemic VTI:  0.21 m MV Peak grad:  2.6 mmHg  Systemic Diam: 2.10 cm MV Mean grad:  1.0 mmHg MV Vmax:       0.80 m/s MV Vmean:      56.2 cm/s MV Decel Time: 277 msec MV E velocity: 73.30 cm/s MV A velocity: 66.80 cm/s MV E/A ratio:  1.10 Yolonda Kida MD Electronically signed by Yolonda Kida MD Signature Date/Time: 12/05/2020/6:14:29 PM    Final    US BIOPSY (KIDNEY)  Result Date: 12/16/2020 CLINICAL DATA:  Kidney injury INDICATION: Kidney injury EXAM: Ultrasound-guided random renal biopsy MEDICATIONS: None. ANESTHESIA/SEDATION: Moderate (conscious) sedation was employed during this procedure. A total of Versed 0.5 mg and Fentanyl 25 mcg was administered intravenously. Moderate Sedation Time: 12 minutes. The patient's level of consciousness and vital signs were monitored continuously by radiology nursing throughout the procedure under my direct supervision. FLUOROSCOPY TIME:  N/a COMPLICATIONS:  None immediate. PROCEDURE: Informed written consent was obtained from the patient after a thorough discussion of the procedural risks, benefits and alternatives. All questions were addressed. Maximal Sterile Barrier Technique was utilized including caps, mask, sterile gowns, sterile gloves, sterile drape, hand hygiene and skin antiseptic. A timeout was performed prior to the initiation of the procedure. The patient was placed supine on the exam table. Limited ultrasound of the bilateral flanks was performed to delineate anatomy. The left lower pole was selected as an appropriate site for random renal biopsy. The overlying skin was marked, prepped and draped in a standard sterile fashion. Local analgesia was obtained with 1% lidocaine. Using ultrasound guidance, a 15 gauge introducer needle was advanced into the left renal pole cortex. Subsequently, core needle biopsy was performed using a 16 gauge core biopsy device x2 passes. Gel-Foam slurry was administered through the introducer needle as it was removed. Limited postprocedure imaging demonstrated expected biopsy changes without large or expanding hematoma. A clean dressing was placed. The patient tolerated the procedure well without immediate complication. IMPRESSION: Successful ultrasound-guided random renal biopsy of the left renal lower pole. Electronically Signed   By: Albin Felling M.D.   On: 12/16/2020 11:51   [place] EKG SITE RITE  Result Date: 11/27/2020 If Site Rite image not attached, placement could not be confirmed due to current cardiac rhythm.  DG FL GUIDED LUMBAR PUNCTURE  Result Date: 11/20/2020 CLINICAL DATA:  Altered mental status EXAM: DIAGNOSTIC LUMBAR PUNCTURE UNDER FLUOROSCOPIC GUIDANCE COMPARISON:  None FLUOROSCOPY TIME:  Fluoroscopy Time:  12 seconds Radiation Exposure Index (if provided by the fluoroscopic device): 1.6 mGy PROCEDURE: Informed consent was obtained from the patient's husband prior to the procedure, including potential  complications of headache, allergy, and pain. With the patient prone, the lower back was prepped with Betadine. 1% Lidocaine was used for local anesthesia. Lumbar puncture was performed at the L4-L5 level using a 20 gauge needle with return of clear CSF. 8 ml of CSF were obtained for laboratory studies. The patient tolerated the procedure well and there were no apparent complications. IMPRESSION: Technically successful fluoroscopic guided lumbar puncture. Electronically Signed   By: Macy Mis M.D.   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  PLAN:    1.  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  Patient received Rituxan 1 week ago and will receive her second dose next Friday.  Appreciate nephrology input.  Patient continues to require dialysis. 2.  Anemia: Improved with transfusion.  Patient now may have mild hemolysis given elevated LDH with decreased haptoglobin.  Likely related to underlying glomerulonephritis.  No schistocytes on peripheral smear.  Bone marrow biopsy did not reveal any significant pathology.   3.  Thrombocytopenia: Chronic and unchanged. 4.  CLL: Patient has been in remission since 2016.  No overt systemic evidence of aggressive disease.    Will follow.   Lloyd Huger, MD   12/19/2020 5:24 PM

## 2020-12-19 NOTE — Progress Notes (Signed)
[facility]  ROUNDING NOTE   Subjective:   Platelets 33 Creatinine 3.88 (4.35) (3.65) (4.99) (4.43) (3.64) (2.82)  Tmax 102F  Patient seen resting in bed Alert and oriented  Eating breakfast  Dialysis yesterday, tolerated well  Rapid response called last night for hypotension and febrile. BP not responding to IVF, pt remained alert and oriented during event  BP recovered and stepdown transfer cancelled.  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    Objective:  Vital signs in last 24 hours:  Temp:  [98.4 F (36.9 C)-102 F (38.9 C)] 98.4 F (36.9 C) (08/26 0757) Pulse Rate:  [77-99] 77 (08/26 0757) Resp:  [17-28] 18 (08/26 0757) BP: (71-150)/(40-103) 122/77 (08/26 0757) SpO2:  [91 %-100 %] 99 % (08/26 0757)  Weight change:  Filed Weights   12/01/20 1730 12/10/20 1345 12/17/20 1450  Weight: 94.4 kg 94.4 kg 94.4 kg    Intake/Output: I/O last 3 completed shifts: In: 1446.1 [I.V.:435.3; IV Piggyback:1010.8] Out: 900 [Urine:400; Other:500]   Intake/Output this shift:  No intake/output data recorded.  Physical Exam: General: NAD, resting in bed  Head: Normocephalic, atraumatic. Moist oral mucosal membranes  Eyes: Anicteric  Lungs:  Clear to auscultation, normal effort  Heart: Regular rate and rhythm  Abdomen:  Soft, nontender  Extremities: 1+ peripheral edema.  Neurologic: Alert, moving all four extremities  Skin: No lesions  Access RUE femoral temp cath 8/17 Schnier, Rt Permcath placed on 16/10/96    Basic Metabolic Panel: Recent Labs  Lab 12/15/20 0523 12/16/20 0516 12/17/20 0346 12/18/20 0505 12/19/20 0704  NA 136 137 135 136 134*  K 4.2 4.0 3.6 3.6 3.5  CL 102 100 98 97* 100  CO2 30 30 30 30 28   GLUCOSE 136* 119* 187* 136* 197*  BUN 40* 43* 25* 30* 26*  CREATININE 4.43* 4.99* 3.65* 4.35* 3.88*   CALCIUM 7.4* 7.4* 7.1* 7.2* 6.8*  MG 1.9 2.0 1.7 1.7 1.5*     Liver Function Tests: Recent Labs  Lab 12/15/20 0523 12/16/20 0516 12/17/20 0346 12/18/20 0505 12/19/20 0704  AST 43* 53* 70* 57* 56*  ALT 13 15 18 10 10   ALKPHOS 73 78 84 84 77  BILITOT 0.6 0.7 0.9 0.9 1.1  PROT 4.7* 4.6* 4.8* 5.1* 4.6*  ALBUMIN 2.0* 2.0* 2.1* 2.1* 1.9*    No results for input(s): LIPASE, AMYLASE in the last 168 hours.  No results for input(s): AMMONIA in the last 168 hours.  CBC: Recent Labs  Lab 12/15/20 0523 12/15/20 2142 12/16/20 0516 12/17/20 0346 12/18/20 0505 12/19/20 0704  WBC 6.3  --  7.6 6.9 5.0 5.2  NEUTROABS 4.7  --  5.5 5.2 3.3 3.4  HGB 7.6*  --  6.7* 9.0* 8.9* 6.3*  HCT 22.1*  --  20.2* 25.9* 25.4* 18.5*  MCV 81.5  --  84.2 83.5 84.4 85.6  PLT 34* 65* 57* 44* 37* 33*     Cardiac Enzymes: No results for input(s): CKTOTAL, CKMB, CKMBINDEX, TROPONINI in the last 168 hours.   BNP: Invalid input(s): POCBNP  CBG: Recent Labs  Lab 12/18/20 0732 12/18/20 1140 12/18/20 1809 12/18/20 2106 12/19/20 0759  GLUCAP 125* 317* 159* 156* 195*     Microbiology: Results for orders placed or performed during the hospital encounter of 11/19/20  Blood culture (routine x 2)     Status: None   Collection Time: 11/19/20  4:47 PM  Specimen: BLOOD  Result Value Ref Range Status   Specimen Description BLOOD RIGHT ANTECUBITAL  Final   Special Requests   Final    BOTTLES DRAWN AEROBIC AND ANAEROBIC Blood Culture adequate volume   Culture   Final    NO GROWTH 5 DAYS Performed at [facility], [address] [place], [place] [address]    Report Status 11/24/2020 FINAL  Final  Resp Panel by RT-PCR (Flu A&B, Covid) Nasopharyngeal Swab     Status: None   Collection Time: 11/19/20  4:48 PM   Specimen: Nasopharyngeal Swab; Nasopharyngeal(NP) swabs in vial transport medium  Result Value Ref Range Status   SARS Coronavirus 2 by RT PCR NEGATIVE NEGATIVE Final    Comment:  (NOTE) SARS-CoV-2 target nucleic acids are NOT DETECTED.  The SARS-CoV-2 RNA is generally detectable in upper respiratory specimens during the acute phase of infection. The lowest concentration of SARS-CoV-2 viral copies this assay can detect is 138 copies/mL. A negative result does not preclude SARS-Cov-2 infection and should not be used as the sole basis for treatment or other patient management decisions. A negative result may occur with  improper specimen collection/handling, submission of specimen other than nasopharyngeal swab, presence of viral mutation(s) within the areas targeted by this assay, and inadequate number of viral copies(<138 copies/mL). A negative result must be combined with clinical observations, patient history, and epidemiological information. The expected result is Negative.  Fact Sheet for Patients:  EntrepreneurPulse.com.au  Fact Sheet for Healthcare Providers:  IncredibleEmployment.be  This test is no t yet approved or cleared by the [place] FDA and  has been authorized for detection and/or diagnosis of SARS-CoV-2 by FDA under an Emergency Use Authorization (EUA). This EUA will remain  in effect (meaning this test can be used) for the duration of the COVID-19 declaration under Section 564(b)(1) of the Act, 21 U.S.C.section 360bbb-3(b)(1), unless the authorization is terminated  or revoked sooner.       Influenza A by PCR NEGATIVE NEGATIVE Final   Influenza B by PCR NEGATIVE NEGATIVE Final    Comment: (NOTE) The Xpert Xpress SARS-CoV-2/FLU/RSV plus assay is intended as an aid in the diagnosis of influenza from Nasopharyngeal swab specimens and should not be used as a sole basis for treatment. Nasal washings and aspirates are unacceptable for Xpert Xpress SARS-CoV-2/FLU/RSV testing.  Fact Sheet for Patients: EntrepreneurPulse.com.au  Fact Sheet for Healthcare  Providers: IncredibleEmployment.be  This test is not yet approved or cleared by the [place] FDA and has been authorized for detection and/or diagnosis of SARS-CoV-2 by FDA under an Emergency Use Authorization (EUA). This EUA will remain in effect (meaning this test can be used) for the duration of the COVID-19 declaration under Section 564(b)(1) of the Act, 21 U.S.C. section 360bbb-3(b)(1), unless the authorization is terminated or revoked.  Performed at [facility], [address] [place], [place] [address]   Blood culture (routine x 2)     Status: None   Collection Time: 11/19/20  6:46 PM   Specimen: BLOOD  Result Value Ref Range Status   Specimen Description BLOOD RIGHT ANTECUBITAL  Final   Special Requests   Final    BOTTLES DRAWN AEROBIC AND ANAEROBIC Blood Culture adequate volume   Culture   Final    NO GROWTH 5 DAYS Performed at [facility], [address], [place], [place] [address]    Report Status 11/24/2020 FINAL  Final  Urine Culture     Status: Abnormal   Collection  Time: 11/19/20  9:33 PM   Specimen: Urine, Clean Catch  Result Value Ref Range Status   Specimen Description   Final    URINE, CLEAN CATCH Performed at [facility], [address], [place], [place] [address]    Special Requests   Final    NONE Performed at [facility], [address] [place], [place] [address]    Culture (A)  Final    <10,000 COLONIES/mL INSIGNIFICANT GROWTH Performed at [place] [address] [place], [place] [address]    Report Status 11/21/2020 FINAL  Final  Culture, fungus without smear     Status: None   Collection Time: 11/20/20 11:09 AM   Specimen: CSF; Cerebrospinal Fluid  Result Value Ref Range Status   Specimen Description   Final    CSF Performed at [facility], [address] [place], [place] [address]    Special Requests   Final    NONE Performed at [facility], [address] [place], [place] [address]    Culture   Final    NO FUNGUS ISOLATED AFTER 21 DAYS Performed at [facility], [place] [address], [place], [place] [address]    Report Status 12/11/2020 FINAL  Final  CSF culture w Gram Stain     Status: None   Collection Time: 11/20/20 11:09 AM   Specimen: PATH Cytology CSF; Cerebrospinal Fluid  Result Value Ref Range Status   Specimen Description   Final    CSF Performed at [facility], [address], [place], [place] [address]    Special Requests   Final    NONE Performed at [facility], [address] [place], [place] [address]    Gram Stain   Final    NO ORGANISMS SEEN WBC SEEN RED BLOOD CELLS PRESENT Performed at [facility], [address] [place], [place] [address]    Culture   Final    NO GROWTH 3 DAYS Performed at [facility], [place] [address], [place], [place] [address]    Report Status 11/23/2020 FINAL  Final  Fungus Culture With Stain     Status: None (Preliminary result)   Collection Time: 11/20/20 11:09 AM   Specimen: PATH Cytology CSF; Cerebrospinal Fluid  Result Value Ref Range Status   Fungus Stain Final report  Final    Comment: (NOTE) Performed At: [facility] [place], [place] [id number] Rush Farmer MD [id number]    Fungus (Mycology) Culture PENDING  Incomplete   Fungal Source CSF  Final    Comment: Performed at [facility], [address] [place],  [address]  Fungus Culture Result     Status: None   Collection Time: 11/20/20 11:09 AM  Result Value Ref Range Status   Result 1 Comment  Final    Comment: (NOTE) KOH/Calcofluor preparation:  no fungus observed. Performed At: [facility] [place], [place] [id number] Rush Farmer MD [id number]   Resp Panel by RT-PCR (Flu A&B, Covid) Nasopharyngeal Swab     Status: None   Collection Time: 11/27/20  1:39 PM   Specimen:  Nasopharyngeal Swab; Nasopharyngeal(NP) swabs in vial transport medium  Result Value Ref Range Status   SARS Coronavirus 2 by RT PCR NEGATIVE NEGATIVE Final    Comment: (NOTE) SARS-CoV-2 target nucleic acids are NOT DETECTED.  The SARS-CoV-2 RNA is generally detectable in upper respiratory specimens during the acute phase of infection. The  lowest concentration of SARS-CoV-2 viral copies this assay can detect is 138 copies/mL. A negative result does not preclude SARS-Cov-2 infection and should not be used as the sole basis for treatment or other patient management decisions. A negative result may occur with  improper specimen collection/handling, submission of specimen other than nasopharyngeal swab, presence of viral mutation(s) within the areas targeted by this assay, and inadequate number of viral copies(<138 copies/mL). A negative result must be combined with clinical observations, patient history, and epidemiological information. The expected result is Negative.  Fact Sheet for Patients:  EntrepreneurPulse.com.au  Fact Sheet for Healthcare Providers:  IncredibleEmployment.be  This test is no t yet approved or cleared by the [place] FDA and  has been authorized for detection and/or diagnosis of SARS-CoV-2 by FDA under an Emergency Use Authorization (EUA). This EUA will remain  in effect (meaning this test can be used) for the duration of the COVID-19 declaration under Section 564(b)(1) of the Act, 21 U.S.C.section 360bbb-3(b)(1), unless the authorization is terminated  or revoked sooner.       Influenza A by PCR NEGATIVE NEGATIVE Final   Influenza B by PCR NEGATIVE NEGATIVE Final    Comment: (NOTE) The Xpert Xpress SARS-CoV-2/FLU/RSV plus assay is intended as an aid in the diagnosis of influenza from Nasopharyngeal swab specimens and should not be used as a sole basis for treatment. Nasal washings and aspirates are unacceptable for  Xpert Xpress SARS-CoV-2/FLU/RSV testing.  Fact Sheet for Patients: EntrepreneurPulse.com.au  Fact Sheet for Healthcare Providers: IncredibleEmployment.be  This test is not yet approved or cleared by the [place] FDA and has been authorized for detection and/or diagnosis of SARS-CoV-2 by FDA under an Emergency Use Authorization (EUA). This EUA will remain in effect (meaning this test can be used) for the duration of the COVID-19 declaration under Section 564(b)(1) of the Act, 21 U.S.C. section 360bbb-3(b)(1), unless the authorization is terminated or revoked.  Performed at [facility], [address] [place], [place] [address]   Urine Culture     Status: Abnormal   Collection Time: 11/27/20  2:15 PM   Specimen: Kidney; Urine  Result Value Ref Range Status   Specimen Description   Final    URINE, CLEAN CATCH Performed at [facility], [address] [place], [place] [address]    Special Requests   Final    NONE Performed at [facility], [place], [place] [address]    Culture 20,000 COLONIES/mL ENTEROBACTER AEROGENES (A)  Final   Report Status 11/30/2020 FINAL  Final   Organism ID, Bacteria ENTEROBACTER AEROGENES (A)  Final      Susceptibility   Enterobacter aerogenes - MIC*    CEFAZOLIN >=64 RESISTANT Resistant     CEFEPIME 1 SENSITIVE Sensitive     CEFTRIAXONE >=64 RESISTANT Resistant     CIPROFLOXACIN <=0.25 SENSITIVE Sensitive     GENTAMICIN <=1 SENSITIVE Sensitive     IMIPENEM <=0.25 SENSITIVE Sensitive     NITROFURANTOIN 64 INTERMEDIATE Intermediate     TRIMETH/SULFA <=20 SENSITIVE Sensitive     PIP/TAZO >=128 RESISTANT Resistant     * 20,000 COLONIES/mL ENTEROBACTER AEROGENES  CULTURE, BLOOD (ROUTINE X 2) w Reflex to ID Panel     Status: None   Collection Time: 11/27/20  8:23 PM   Specimen: BLOOD  Result Value Ref Range Status   Specimen Description BLOOD LAC  Final    Special Requests   Final    BOTTLES DRAWN AEROBIC AND ANAEROBIC  Blood Culture results may not be optimal due to an inadequate volume of blood received in culture bottles   Culture   Final    NO GROWTH 5 DAYS Performed at [facility], [address] [place], [place] [address]    Report Status 12/02/2020 FINAL  Final  CULTURE, BLOOD (ROUTINE X 2) w Reflex to ID Panel     Status: None   Collection Time: 11/27/20  9:32 PM   Specimen: BLOOD LEFT HAND  Result Value Ref Range Status   Specimen Description BLOOD LEFT HAND  Final   Special Requests   Final    BOTTLES DRAWN AEROBIC AND ANAEROBIC Blood Culture adequate volume   Culture   Final    NO GROWTH 5 DAYS Performed at [facility], [address] [place], [place] [address]    Report Status 12/02/2020 FINAL  Final  CULTURE, BLOOD (ROUTINE X 2) w Reflex to ID Panel     Status: None   Collection Time: 12/06/20 10:35 AM   Specimen: BLOOD  Result Value Ref Range Status   Specimen Description BLOOD LT KNUCKLE  Final   Special Requests   Final    BOTTLES DRAWN AEROBIC AND ANAEROBIC Blood Culture adequate volume   Culture   Final    NO GROWTH 5 DAYS Performed at [facility], [address] [place], [place] [address]    Report Status 12/11/2020 FINAL  Final  CULTURE, BLOOD (ROUTINE X 2) w Reflex to ID Panel     Status: None   Collection Time: 12/06/20 10:35 AM   Specimen: BLOOD  Result Value Ref Range Status   Specimen Description BLOOD BLOOD LEFT WRIST  Final   Special Requests AEROBIC BOTTLE ONLY Blood Culture adequate volume  Final   Culture   Final    NO GROWTH 5 DAYS Performed at [facility], [address], [place], [place] [address]    Report Status 12/11/2020 FINAL  Final  Culture, blood (single) w Reflex to ID Panel     Status: None   Collection Time: 12/14/20  2:36 PM   Specimen: BLOOD  Result Value Ref Range Status   Specimen Description BLOOD BLOOD RIGHT HAND  Final    Special Requests   Final    BOTTLES DRAWN AEROBIC AND ANAEROBIC Blood Culture adequate volume   Culture   Final    NO GROWTH 5 DAYS Performed at [facility], [address], [place], [place] [address]    Report Status 12/19/2020 FINAL  Final  Culture, blood (single) w Reflex to ID Panel     Status: None   Collection Time: 12/14/20  3:38 PM   Specimen: BLOOD  Result Value Ref Range Status   Specimen Description BLOOD A-LINE DRAW  Final   Special Requests   Final    BOTTLES DRAWN AEROBIC AND ANAEROBIC Blood Culture adequate volume   Culture   Final    NO GROWTH 5 DAYS Performed at [facility], [address] [place], [place] [address]    Report Status 12/19/2020 FINAL  Final  CULTURE, BLOOD (ROUTINE X 2) w Reflex to ID Panel     Status: None (Preliminary result)   Collection Time: 12/18/20 11:44 PM   Specimen: BLOOD LEFT WRIST  Result Value Ref Range Status   Specimen Description BLOOD LEFT WRIST  Final   Special Requests   Final    BOTTLES DRAWN AEROBIC AND ANAEROBIC Blood Culture adequate volume   Culture  Final    NO GROWTH < 12 HOURS Performed at [facility], [address] [place], [place] [address]    Report Status PENDING  Incomplete  CULTURE, BLOOD (ROUTINE X 2) w Reflex to ID Panel     Status: None (Preliminary result)   Collection Time: 12/18/20 11:51 PM   Specimen: BLOOD LEFT HAND  Result Value Ref Range Status   Specimen Description BLOOD LEFT HAND  Final   Special Requests   Final    BOTTLES DRAWN AEROBIC AND ANAEROBIC Blood Culture adequate volume   Culture   Final    NO GROWTH < 12 HOURS Performed at [facility], [address] [place], [place] [address]    Report Status PENDING  Incomplete    Coagulation Studies: No results for input(s): LABPROT, INR in the last 72 hours.   Urinalysis: No results for input(s): COLORURINE, LABSPEC, PHURINE, GLUCOSEU, HGBUR, BILIRUBINUR, KETONESUR, PROTEINUR, UROBILINOGEN,  NITRITE, LEUKOCYTESUR in the last 72 hours.  Invalid input(s): APPERANCEUR     Imaging: PERIPHERAL VASCULAR CATHETERIZATION  Result Date: 12/17/2020 See surgical note for result.    Medications:       amLODipine  5 mg Oral Daily   Chlorhexidine Gluconate Cloth  6 each Topical Daily   feeding supplement  1 Container Oral TID BM   insulin aspart  0-15 Units Subcutaneous TID WC   insulin aspart  0-5 Units Subcutaneous QHS   levothyroxine  100 mcg Oral QAC breakfast   magnesium oxide  800 mg Oral BID   mouth rinse  15 mL Mouth Rinse BID   midodrine  10 mg Oral TID WC   mirtazapine  15 mg Oral QHS   multivitamin with minerals  1 tablet Oral Daily   pantoprazole  40 mg Oral Daily   pneumococcal 13-valent conjugate vaccine  0.5 mL Intramuscular Tomorrow-1000   psyllium  1 packet Oral Daily   rosuvastatin  10 mg Oral QHS   sodium bicarbonate  650 mg Oral TID   sodium chloride flush  10-40 mL Intracatheter Q12H   thiamine  100 mg Oral Daily   valACYclovir  500 mg Oral Daily   acetaminophen **OR** acetaminophen, albuterol, haloperidol lactate, HYDROmorphone (DILAUDID) injection, loperamide, ondansetron **OR** ondansetron (ZOFRAN) IV, ondansetron (ZOFRAN) IV, oxyCODONE, polyethylene glycol, sodium chloride flush  Assessment/ Plan:  [name] is a 55 y.o. black female with diabetes mellitus type II, hypertension, hypothyroidism, CLL who was admitted to [facility] on 11/19/2020 for Altered mental status [R41.82] Encephalopathy acute [G93.40] Altered mental status, unspecified altered mental status type [R41.82] Sepsis with encephalopathy without septic shock, due to unspecified organism ([place]) [A41.9, R65.20, G93.40]  Acute Kidney Injury with hematuria and proteinuria with baseline creatinine 0.49 on 11/22/20.  IV contrast exposure on November 21, 2020.  Renal ultrasound negative for mass and obstruction.  Serologic studies: ANA+, ENA+, antichromatin +, anti- Ro_, P-ANCA + Completed  3 days of high dose IV steroids Rituximab on 8/17. Second dose in 4 weeks.  - Appreciate IR performing renal biopsy 12/16/20.  - Appreciate Vascular for placing Rt Permcath on 12/17/20 - Received dialysis yesterday, tolerated well. UF 536m removed. 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 Treatments include high dose oral steroids and Rituximan. Ordered Prednisone 878mdaily for 1 week, Rituximan started last week with next dose scheduled for late next week. Discussed prognosis with patient and family, 14% renal recovery and possibility for renal transplant in 3-5 years.  - Outpatient dialysis needed  and placement underway at [facility] with nursing about temp cath removal today - Dialysis scheduled for tomorrow in chair    Lab Results  Component Value Date   CREATININE 3.88 (H) 12/19/2020   CREATININE 4.35 (H) 12/18/2020   CREATININE 3.65 (H) 12/17/2020    Intake/Output Summary (Last 24 hours) at 12/19/2020 0945 Last data filed at 12/19/2020 0644 Gross per 24 hour  Intake 1446.13 ml  Output 500 ml  Net 946.13 ml    2. Anemia  with history of CLL and thrombocytopenia Lab Results  Component Value Date   HGB 6.3 (L) 12/19/2020   Bone marrow biopsy 12/04/20, limited marrow material with trilineage hematopoiesis. Recommending repeat bone marrow biopsy Appreciate hematology input.  - Numerous platelet transfusions,RBC transfusion ordered for today - Will remove temp cath today  3. Fever of unknown origin: not currently on antibiotics. Receiving blood products and rituximab. Most recent cultures are negative.       LOS: [place] 8/26/20229:45 AM

## 2020-12-19 NOTE — Progress Notes (Signed)
At 1800 D/C of Rt Femoral line. Pressure held for 20" with gauze pressure dressing. Small Hematoma noted at site & marked on dressing. Patient tolerated procedure well. Patients RN aware of dressing and markings.

## 2020-12-19 NOTE — Progress Notes (Signed)
BP 74/46.  Rest of VSS. MEWS 2 d/t hypotension.  Patient denies lightheadedness, or dizziness. Asymptomatic.  She had fever 100.5 at 1800, current temp 99.4.  Patient back from dialysis around 1800. BP then was 121/72 and amlodipine and Cardizem given at that time.  Dr. Sidney Ace notified of the above.  Bolus 246ml ordered and given.  Post bolus BP improved 87/53. Oral temp 102, 2nd check temp 100.6.  MEWS score 3.  Patient was asymptomatic and denied any distress.  Awake and alert x3. Called Dr. Sidney Ace and rapid response per protocol.  Dr. Sidney Ace saw patient. Tylenol, 2nd bolus, Solu-Cortef given per order. Blood culture was drawn.   Post 2nd bolus BP 89/51 MAP 62, temp 99.3. Rest of VSS.  Dr. Sidney Ace has been notified. 3rd bolus ordered and given, continuous IVF and midodrine initiated.   Post 3rd bolus BP 86/40 MAP 52. Rest of VSS.  Asymptomatic. Dr. Sidney Ace notified and planned to transfer patient to ICU.  Prior to transfer this RN rechecked BP which was 112/65, 2nd BP check 132/68 (manually).  Transfer was cancelled.

## 2020-12-20 DIAGNOSIS — D8989 Other specified disorders involving the immune mechanism, not elsewhere classified: Secondary | ICD-10-CM | POA: Diagnosis not present

## 2020-12-20 DIAGNOSIS — M4620 Osteomyelitis of vertebra, site unspecified: Secondary | ICD-10-CM | POA: Insufficient documentation

## 2020-12-20 DIAGNOSIS — G8250 Quadriplegia, unspecified: Secondary | ICD-10-CM | POA: Insufficient documentation

## 2020-12-20 DIAGNOSIS — G0490 Encephalitis and encephalomyelitis, unspecified: Secondary | ICD-10-CM | POA: Insufficient documentation

## 2020-12-20 DIAGNOSIS — Z993 Dependence on wheelchair: Secondary | ICD-10-CM | POA: Insufficient documentation

## 2020-12-20 LAB — COMPREHENSIVE METABOLIC PANEL
ALT: 10 U/L (ref 0–44)
AST: 49 U/L — ABNORMAL HIGH (ref 15–41)
Albumin: 2.2 g/dL — ABNORMAL LOW (ref 3.5–5.0)
Alkaline Phosphatase: 82 U/L (ref 38–126)
Anion gap: 9 (ref 5–15)
BUN: 42 mg/dL — ABNORMAL HIGH (ref 6–20)
CO2: 26 mmol/L (ref 22–32)
Calcium: 7.1 mg/dL — ABNORMAL LOW (ref 8.9–10.3)
Chloride: 98 mmol/L (ref 98–111)
Creatinine, Ser: 4.45 mg/dL — ABNORMAL HIGH (ref 0.44–1.00)
GFR, Estimated: 11 mL/min — ABNORMAL LOW (ref >60)
Glucose, Bld: 378 mg/dL — ABNORMAL HIGH (ref 70–99)
Potassium: 3.6 mmol/L (ref 3.5–5.1)
Sodium: 133 mmol/L — ABNORMAL LOW (ref 135–145)
Total Bilirubin: 0.9 mg/dL (ref 0.3–1.2)
Total Protein: 5.1 g/dL — ABNORMAL LOW (ref 6.5–8.1)

## 2020-12-20 LAB — TYPE AND SCREEN
ABO/RH(D): O POS
Antibody Screen: POSITIVE
Unit division: 0
Unit division: 0
Unit division: 0
Unit division: 0

## 2020-12-20 LAB — CBC WITH DIFFERENTIAL/PLATELET
Abs Immature Granulocytes: 0.04 10*3/uL (ref 0.00–0.07)
Basophils Absolute: 0 10*3/uL (ref 0.0–0.1)
Basophils Relative: 0 %
Eosinophils Absolute: 0 10*3/uL (ref 0.0–0.5)
Eosinophils Relative: 0 %
HCT: 21.1 % — ABNORMAL LOW (ref 36.0–46.0)
Hemoglobin: 7.1 g/dL — ABNORMAL LOW (ref 12.0–15.0)
Immature Granulocytes: 1 %
Lymphocytes Relative: 24 %
Lymphs Abs: 1.9 10*3/uL (ref 0.7–4.0)
MCH: 27.8 pg (ref 26.0–34.0)
MCHC: 33.6 g/dL (ref 30.0–36.0)
MCV: 82.7 fL (ref 80.0–100.0)
Monocytes Absolute: 0.2 10*3/uL (ref 0.1–1.0)
Monocytes Relative: 3 %
Neutro Abs: 5.7 10*3/uL (ref 1.7–7.7)
Neutrophils Relative %: 72 %
Platelets: 59 10*3/uL — ABNORMAL LOW (ref 150–400)
RBC: 2.55 MIL/uL — ABNORMAL LOW (ref 3.87–5.11)
RDW: 15 % (ref 11.5–15.5)
WBC: 7.9 10*3/uL (ref 4.0–10.5)
nRBC: 0 % (ref 0.0–0.2)

## 2020-12-20 LAB — MISC LABCORP TEST (SEND OUT): Labcorp test code: 164284

## 2020-12-20 LAB — BPAM RBC
Blood Product Expiration Date: 202208302359
Blood Product Expiration Date: 202209072359
Blood Product Expiration Date: 202209072359
Blood Product Expiration Date: 202209072359
ISSUE DATE / TIME: 202208231553
ISSUE DATE / TIME: 202208232151
ISSUE DATE / TIME: 202208262228
Unit Type and Rh: 9500
Unit Type and Rh: 9500
Unit Type and Rh: 9500
Unit Type and Rh: 9500

## 2020-12-20 LAB — PREPARE PLATELET PHERESIS: Unit division: 0

## 2020-12-20 LAB — GLUCOSE, CAPILLARY
Glucose-Capillary: 356 mg/dL — ABNORMAL HIGH (ref 70–99)
Glucose-Capillary: 219 mg/dL — ABNORMAL HIGH (ref 70–99)
Glucose-Capillary: 278 mg/dL — ABNORMAL HIGH (ref 70–99)
Glucose-Capillary: 329 mg/dL — ABNORMAL HIGH (ref 70–99)

## 2020-12-20 LAB — BPAM PLATELET PHERESIS
Blood Product Expiration Date: 202208282359
ISSUE DATE / TIME: 202208261629
Unit Type and Rh: 5100

## 2020-12-20 LAB — MAGNESIUM: Magnesium: 1.7 mg/dL (ref 1.7–2.4)

## 2020-12-20 NOTE — Progress Notes (Signed)
PT Cancellation Note  Patient Details Name: Tonya Myers MRN: [id number] DOB: 28-Apr-1965   Cancelled Treatment:     PT attempt. Pt is off floor at HD. Acute PT will continue to follow and progress as able per current POC.   [place] Pa 12/20/2020, 10:02 AM

## 2020-12-20 NOTE — Progress Notes (Signed)
Progress Note    Tonya Myers   FMB:[id number]  DOB: 22-Aug-1965  DOA: 11/19/2020     30  PCP: Tonya Pink, MD  Initial CC: AMS  Hospital Course: [name] is a 55 y.o. female with medical history significant for recent pneumococcal meningitis and bacteremia, depression, hypothyroid, IDDM2 who presented to the ED with AMS. She underwent extensive work-up after admission with multiple subspecialty consults. Infectious work-up with ID is negative and ID has signed off on 12/09/2020 (see FUO workup below). She also underwent further work-up with nephrology, oncology, and rheumatology. She has multiple serologies concerning for possible lupus and trying to now obtain renal biopsy to help further diagnose.  Thrombocytopenia has been preventing biopsy obtainment. Due to worsening renal function, she was started on HD.   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  Results 8/26 - P-ANCA mediated necrotizing GN - She does not require plasmapheresis at this time - Resume steroids and continue rituximab (8/19) per consults - Fevers resolved with steroid initiation  AKI (acute kidney injury) ([place]) transitioning to renal failure, ongoing - baseline creat ~0.7 and renal function has continued to deteriorate - nephrology following  - Renal biopsy 12/16/2020 results as above P-ANCA GN as above - s/p femoral trialysis on 8/17; started 1st HD session on 8/17 as well -transition to permacath 12/17/2020 - continue HD per nephrology -patient has outpatient dialysis slot as soon as next week Tuesday 8/30  Oral thrush - Resolved --s/p Diflucan for 3 doses  Pancytopenia ([place]) History of CLL  Oncology following Patient's  CLL has been in remission since 2016 Recurrence of hematologic malignancy felt unlikely - likely exacerbated by above --1u PRBC 8/5  - 1u PRBC 8/10 - 1u PRBC 8/18 - 1u PRBC 8/21 - 2u PRBC 8/23 - 1u PRBC 8/26  FUO (fever of unknown origin) - Likely autoimmune in etiology given above - ID has followed initially and signed off on 8/16 due to lack of any infectious process -reconsulted on 8/23 due to recurrent fevers - negative procalcitonin again likely autoimmune - Fevers initially resolved with steroids, now recurrent fevers daily after steroid cessation - resolved again with steroids likely confirming autoimmune etiology - Follow up UA/culture and blood cultures - remain negative  Thrombocytopenia ([place]) - also see pancytopenia above - 2 packs platelets 8/17  - 1 pool plt 8/18 - still low on 8/20; [id number] ordered per nephro on 8/20 - 2 pool Platelets on 8/20, 8/22, 8/23 - 1 pool 8/26 for central line removal (plt 33)  Hypokalemia/Hypomagnesemia - Replete and recheck as needed - WNL  Acute metabolic encephalopathy, improving - 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  CT head neg for new stroke.  Hematology ruled out CLL as the culprit.  No seizure on EEG. --Continue vitamin/thiamine repletion. --MRI brain, no acute finding  Hyperlipidemia, mixed - Continue statin  Essential hypertension - Continue amlodipine - Transient hypotension overnight - resolved - hold midodrine - continue steroids  Controlled type 2 diabetes mellitus without complication, without long-term current use of insulin (HCC) - A1c 6.5% on 10/02/20 -Transiently elevated with steroids, improving   -Continue sliding scale insulin, hypoglycemic protocol  Hypothyroidism - Continue Synthroid  Personal history of CLL (chronic lymphocytic leukemia) -  in remission since 2016 per oncology  - no relapse seen on peripheral  smear performed on 11/25/20 "Unremarkable WBC count with unremarkable WBC morphology. Thrombocytopenia with unremarkable platelet morphology. Findings do not support CLL relapse." - no findings on BM biopsy on 8/11 but repeat biopsy was recommended as well: "Trilineage hematopoiesis (limited material)"  Pancreatitis, POA-resolved as of 12/11/2020 Unclear etiology -no alcohol use, questionably drug-induced but imaging and symptoms negative for gallstone or obstructive etiology, triglycerides within normal limits  DVT prophylaxis: Place TED hose Start: 11/19/20 2301   Code Status:   Code Status: Full Code Family Communication: Husband/family at bedside  Disposition Plan: Status is: Inpatient  Remains inpatient appropriate because:Inpatient level of care appropriate due to severity of illness  Dispo: The patient is from: Home              Anticipated d/c is to: Home              Patient currently stabilizing for discharge   Difficult to place patient No  Risk of unplanned readmission score: Unplanned Admission- Pilot do not use: 54.92   Objective: Blood pressure 112/71, pulse 79, temperature 98.2 F (36.8 C), temperature source Oral, resp. rate 15, height 6' (1.829 m), weight 94.4 kg, SpO2 97 %.   Examination: General appearance: alert, cooperative, and no distress Head: Normocephalic atraumatic Mouth: no thrush noted; Torus Palatinus noted  Eyes:  EOMI Lungs: clear to auscultation bilaterally Heart: regular rate and rhythm and S1, S2 normal Abdomen: normal findings: bowel sounds normal and soft, non-tender Extremities:  no LE edema; swollen joints noted in ankle, wrist; some edema in fingers Skin:  excess skin noted due to weight loss Neurologic: no focal deficits   Consultants:  Oncology/hematology Rheumatology Nephrology ID  Procedures:  Renal biopsy 12/16/2020  Data Reviewed: I have personally reviewed following labs and imaging studies Results for orders placed or  performed during the hospital encounter of 11/19/20 (from the past 24 hour(s))  Glucose, capillary     Status: Abnormal   Collection Time: 12/19/20  7:59 AM  Result Value Ref Range   Glucose-Capillary 195 (H) 70 - 99 mg/dL  Glucose, capillary     Status: Abnormal   Collection Time: 12/19/20 12:16 PM  Result Value Ref Range   Glucose-Capillary 399 (H) 70 - 99 mg/dL  Prepare RBC (crossmatch)     Status: None   Collection Time: 12/19/20 12:41 PM  Result Value Ref Range   Order Confirmation      ORDER PROCESSED BY BLOOD BANK Performed at [facility], [address] [place], [place] [address]   Prepare Pheresed Platelets     Status: None   Collection Time: 12/19/20  1:50 PM  Result Value Ref Range   Unit Number [id number]    Blood Component Type PLTP2 PSORALEN TREATED    Unit division 00    Status of Unit ISSUED,FINAL    Transfusion Status      OK TO TRANSFUSE Performed at [facility], [address] [place], [place] [address]   Glucose, capillary     Status: Abnormal   Collection Time: 12/19/20  4:56 PM  Result Value Ref Range   Glucose-Capillary 468 (H) 70 - 99 mg/dL  Glucose, capillary     Status: Abnormal   Collection Time: 12/19/20  9:22 PM  Result Value Ref Range   Glucose-Capillary 382 (H) 70 - 99 mg/dL  CBC with Differential/Platelet     Status: Abnormal   Collection Time: 12/20/20  5:19 AM  Result Value Ref Range   WBC 7.9 4.0 - 10.5 K/uL   RBC 2.55 (L) 3.87 - 5.11 MIL/uL   Hemoglobin 7.1 (L) 12.0 - 15.0 g/dL   HCT 21.1 (L) 36.0 - 46.0 %   MCV 82.7 80.0 - 100.0 fL   MCH 27.8 26.0 - 34.0 pg   MCHC 33.6 30.0 - 36.0 g/dL   RDW 15.0 11.5 - 15.5 %   Platelets 59 (L) 150 - 400 K/uL   nRBC 0.0 0.0 - 0.2 %   Neutrophils Relative % 72 %   Neutro Abs 5.7 1.7 - 7.7 K/uL   Lymphocytes Relative 24 %   Lymphs Abs 1.9 0.7 - 4.0 K/uL   Monocytes Relative 3 %   Monocytes Absolute 0.2 0.1 - 1.0 K/uL   Eosinophils Relative 0 %   Eosinophils Absolute  0.0 0.0 - 0.5 K/uL   Basophils Relative 0 %   Basophils Absolute 0.0 0.0 - 0.1 K/uL   Immature Granulocytes 1 %   Abs Immature Granulocytes 0.04 0.00 - 0.07 K/uL  Comprehensive metabolic panel     Status: Abnormal   Collection Time: 12/20/20  5:19 AM  Result Value Ref Range   Sodium 133 (L) 135 - 145 mmol/L   Potassium 3.6 3.5 - 5.1 mmol/L   Chloride 98 98 - 111 mmol/L   CO2 26 22 - 32 mmol/L   Glucose, Bld 378 (H) 70 - 99 mg/dL   BUN 42 (H) 6 - 20 mg/dL   Creatinine, Ser 4.45 (H) 0.44 - 1.00 mg/dL   Calcium 7.1 (L) 8.9 - 10.3 mg/dL   Total Protein 5.1 (L) 6.5 - 8.1 g/dL   Albumin 2.2 (L) 3.5 - 5.0 g/dL   AST 49 (H) 15 - 41 U/L   ALT 10 0 - 44 U/L   Alkaline Phosphatase 82 38 - 126 U/L   Total Bilirubin 0.9 0.3 - 1.2 mg/dL   GFR, Estimated 11 (L) >60 mL/min   Anion gap 9 5 - 15  Magnesium     Status: None   Collection Time: 12/20/20  5:19 AM  Result Value Ref Range   Magnesium 1.7 1.7 - 2.4 mg/dL    Recent Results (from the past 240 hour(s))  Culture, blood (single) w Reflex to ID Panel     Status: None   Collection Time: 12/14/20  2:36 PM   Specimen: BLOOD  Result Value Ref Range Status   Specimen Description BLOOD BLOOD RIGHT HAND  Final   Special Requests   Final    BOTTLES DRAWN AEROBIC AND ANAEROBIC Blood Culture adequate volume   Culture   Final    NO GROWTH 5 DAYS Performed at [facility], [address] [place], [place] [address]    Report Status 12/19/2020 FINAL  Final  Culture, blood (single) w Reflex to ID Panel     Status: None   Collection Time: 12/14/20  3:38 PM   Specimen: BLOOD  Result Value Ref Range Status   Specimen Description BLOOD A-LINE DRAW  Final   Special Requests   Final    BOTTLES DRAWN AEROBIC AND ANAEROBIC Blood Culture adequate volume   Culture   Final    NO GROWTH 5 DAYS Performed at [facility], [address] [place], [place] [address]    Report Status 12/19/2020 FINAL  Final  CULTURE, BLOOD (ROUTINE X 2)  w Reflex to ID Panel     Status: None (Preliminary result)  Collection Time: 12/18/20 11:44 PM   Specimen: BLOOD LEFT WRIST  Result Value Ref Range Status   Specimen Description BLOOD LEFT WRIST  Final   Special Requests   Final    BOTTLES DRAWN AEROBIC AND ANAEROBIC Blood Culture adequate volume   Culture   Final    NO GROWTH < 12 HOURS Performed at [facility], [address] [place], [place] [address]    Report Status PENDING  Incomplete  CULTURE, BLOOD (ROUTINE X 2) w Reflex to ID Panel     Status: None (Preliminary result)   Collection Time: 12/18/20 11:51 PM   Specimen: BLOOD LEFT HAND  Result Value Ref Range Status   Specimen Description BLOOD LEFT HAND  Final   Special Requests   Final    BOTTLES DRAWN AEROBIC AND ANAEROBIC Blood Culture adequate volume   Culture   Final    NO GROWTH < 12 HOURS Performed at [facility], [address] [place], [place] [address]    Report Status PENDING  Incomplete     Radiology Studies: No results found. US BIOPSY (KIDNEY)  Final Result    US Venous Img Upper Bilat (DVT)  Final Result    US Venous Img Lower Bilateral (DVT)  Final Result    DG Chest 1 View  Final Result    CT BONE MARROW BIOPSY  Final Result    US RENAL  Final Result    MR Lumbar Spine W Wo Contrast  Final Result    MR THORACIC SPINE W WO CONTRAST  Final Result    MR CERVICAL SPINE W WO CONTRAST  Final Result    MR BRAIN W WO CONTRAST  Final Result    DG Chest Port 1 View  Final Result    [place] EKG SITE RITE  Final Result    CT HEAD W & WO CONTRAST  Final Result    CT CHEST ABDOMEN PELVIS W CONTRAST  Final Result    DG FL GUIDED LUMBAR PUNCTURE  Final Result    CT Head Wo Contrast  Final Result    DG Chest Portable 1 View  Final Result      Scheduled Meds:  amLODipine  5 mg Oral Daily   Chlorhexidine Gluconate Cloth  6 each Topical Daily   feeding supplement  1 Container Oral TID BM   insulin aspart   0-15 Units Subcutaneous TID WC   insulin aspart  0-5 Units Subcutaneous QHS   levothyroxine  100 mcg Oral QAC breakfast   magnesium oxide  800 mg Oral BID   mouth rinse  15 mL Mouth Rinse BID   mirtazapine  15 mg Oral QHS   multivitamin with minerals  1 tablet Oral Daily   pantoprazole  40 mg Oral Daily   pneumococcal 13-valent conjugate vaccine  0.5 mL Intramuscular Tomorrow-1000   predniSONE  80 mg Oral Q breakfast   psyllium  1 packet Oral Daily   rosuvastatin  10 mg Oral QHS   sodium bicarbonate  650 mg Oral TID   sodium chloride flush  10-40 mL Intracatheter Q12H   thiamine  100 mg Oral Daily   valACYclovir  500 mg Oral Daily   PRN Meds: acetaminophen **OR** acetaminophen, albuterol, haloperidol lactate, HYDROmorphone (DILAUDID) injection, loperamide, ondansetron **OR** ondansetron (ZOFRAN) IV, ondansetron (ZOFRAN) IV, oxyCODONE, polyethylene glycol, sodium chloride flush Continuous Infusions:   LOS: 30 days  Time spent: Greater than 50% of the 35 minute visit was spent in counseling/coordination of  care for the patient as laid out in the A&P.   Little Ishikawa, DO [facility] 12/20/2020, 7:13 AM

## 2020-12-20 NOTE — Progress Notes (Signed)
[facility]  Pt completed full tx, afebrile. 100 ml removed due to soft pressures.   SK

## 2020-12-20 NOTE — TOC Progression Note (Signed)
Transition of Care [facility]) - Progression Note    Patient Details  Name: Tonya Myers MRN: [id number] Date of Birth: 04-25-1966  Transition of Care [facility]) [name] Contact  Shelbie Hutching, RN Phone Number: 12/20/2020, 12:02 PM  Clinical Narrative:    Patient getting dialysis today.  Patient [facility] dialysis navigator has patient set up for OP dialysis at [facility] at 1145.  Patient can start OP on 8/30 if discharged before then.  She is active with [facility] and patient and husband want to go home.    Expected Discharge Plan: [place] Barriers to Discharge: Continued Medical Work up  Expected Discharge Plan and Services Expected Discharge Plan: Day   Discharge Planning Services: CM Consult Post Acute Care Choice: [place], [name] Living arrangements for the past 2 months: Single Family Home                 DME Arranged: N/A DME Agency: NA       HH Arranged: PT, OT [place] Agency: [place] ([facility]) Date HH Agency Contacted: 11/24/20 Time [place]: 1419 Representative spoke with at [place]: [place] ([place]) Interventions    Readmission Risk Interventions Readmission Risk Prevention Plan 11/24/2020  Transportation Screening Complete  PCP or Specialist Appt within 3-5 Days Complete  HRI or [place] Complete  Social Work Consult for [place] Planning/Counseling Complete  Palliative Care Screening Not Applicable  Medication Review Press photographer) Complete  Some recent data might be hidden

## 2020-12-20 NOTE — Progress Notes (Signed)
[facility]  PROGRESS NOTE   Subjective:   Patient seen on dialysis.  Stable dialysis via permacath. Denies any chest pain, shortness of breath.  Objective:  Vital signs in last 24 hours:  Temp:  [97.7 F (36.5 C)-99.4 F (37.4 C)] 97.7 F (36.5 C) (08/27 1230) Pulse Rate:  [64-100] 64 (08/27 1230) Resp:  [14-23] 16 (08/27 1130) BP: (94-128)/(62-98) 109/68 (08/27 1230) SpO2:  [94 %-100 %] 98 % (08/27 1130)  Weight change:  Filed Weights   12/01/20 1730 12/10/20 1345 12/17/20 1450  Weight: 94.4 kg 94.4 kg 94.4 kg    Intake/Output: I/O last 3 completed shifts: In: 2611.1 [P.O.:120; I.V.:445.3; Blood:1035; IV Piggyback:1010.8] Out: -    Intake/Output this shift:  Total I/O In: -  Out: 100 [Other:100]  Physical Exam: General:  No acute distress  Head:  Normocephalic, atraumatic. Moist oral mucosal membranes  Eyes:  Anicteric  Neck:  Supple  Lungs:   Clear to auscultation, normal effort  Heart:  S1S2 no rubs  Abdomen:   Soft, nontender, bowel sounds present  Extremities:  peripheral edema.  Neurologic:  Awake, alert, following commands  Skin:  No lesions  Access:     Basic Metabolic Panel: Recent Labs  Lab 12/16/20 0516 12/17/20 0346 12/18/20 0505 12/19/20 0704 12/20/20 0519  NA 137 135 136 134* 133*  K 4.0 3.6 3.6 3.5 3.6  CL 100 98 97* 100 98  CO2 30 30 30 28 26   GLUCOSE 119* 187* 136* 197* 378*  BUN 43* 25* 30* 26* 42*  CREATININE 4.99* 3.65* 4.35* 3.88* 4.45*  CALCIUM 7.4* 7.1* 7.2* 6.8* 7.1*  MG 2.0 1.7 1.7 1.5* 1.7    CBC: Recent Labs  Lab 12/16/20 0516 12/17/20 0346 12/18/20 0505 12/19/20 0704 12/20/20 0519  WBC 7.6 6.9 5.0 5.2 7.9  NEUTROABS 5.5 5.2 3.3 3.4 5.7  HGB 6.7* 9.0* 8.9* 6.3* 7.1*  HCT 20.2* 25.9* 25.4* 18.5* 21.1*  MCV 84.2 83.5 84.4 85.6 82.7  PLT 57* 44* 37* 33* 59*     Urinalysis: No results for input(s): COLORURINE, LABSPEC, PHURINE, GLUCOSEU, HGBUR, BILIRUBINUR, KETONESUR, PROTEINUR, UROBILINOGEN,  NITRITE, LEUKOCYTESUR in the last 72 hours.  Invalid input(s): APPERANCEUR    Imaging: No results found.   Medications:     amLODipine  5 mg Oral Daily   Chlorhexidine Gluconate Cloth  6 each Topical Daily   feeding supplement  1 Container Oral TID BM   insulin aspart  0-15 Units Subcutaneous TID WC   insulin aspart  0-5 Units Subcutaneous QHS   levothyroxine  100 mcg Oral QAC breakfast   magnesium oxide  800 mg Oral BID   mouth rinse  15 mL Mouth Rinse BID   mirtazapine  15 mg Oral QHS   multivitamin with minerals  1 tablet Oral Daily   pantoprazole  40 mg Oral Daily   pneumococcal 13-valent conjugate vaccine  0.5 mL Intramuscular Tomorrow-1000   predniSONE  80 mg Oral Q breakfast   psyllium  1 packet Oral Daily   rosuvastatin  10 mg Oral QHS   sodium bicarbonate  650 mg Oral TID   sodium chloride flush  10-40 mL Intracatheter Q12H   thiamine  100 mg Oral Daily   valACYclovir  500 mg Oral Daily    Assessment/ Plan:     Principal Problem:   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[place]) Active Problems:   Personal history of CLL (chronic lymphocytic leukemia)   DDD (degenerative disc disease), cervical   Hypothyroidism   Controlled  type 2 diabetes mellitus without complication, without long-term current use of insulin (HCC)   Essential hypertension   Hyperlipidemia, mixed   Acute metabolic encephalopathy   AKI (acute kidney injury) ([place])   Hypomagnesemia   Anemia of chronic disease   Hypokalemia   Thrombocytopenia (HCC)   FUO (fever of unknown origin)   Pancytopenia (HCC)   Oral thrush  55 year old female with history of diabetes, peripheral vascular disease, hypertension, coronary artery disease, hypothyroidism, CLL now admitted with history of mental status changes and found to have acute kidney injury.  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  She was given high-dose  steroids and rituximab.  Patient is presently on hemodialysis on a TTS schedule.  Outpatient dialysis arrangements are being made.  #2: Metabolic acidosis: This has resolved dialysis.  We will stop the sodium bicarbonate.  #3: Anemia: We will continue to monitor closely and follow the anemia protocol.  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  Continue supportive care and will make arrangements for outpatient dialysis.   LOS: [place], [facility] 8/27/20222:08 PM

## 2020-12-21 DIAGNOSIS — D8989 Other specified disorders involving the immune mechanism, not elsewhere classified: Secondary | ICD-10-CM | POA: Diagnosis not present

## 2020-12-21 LAB — CBC WITH DIFFERENTIAL/PLATELET
Abs Immature Granulocytes: 0.05 10*3/uL (ref 0.00–0.07)
Basophils Absolute: 0 10*3/uL (ref 0.0–0.1)
Basophils Relative: 0 %
Eosinophils Absolute: 0 10*3/uL (ref 0.0–0.5)
Eosinophils Relative: 0 %
HCT: 19.6 % — ABNORMAL LOW (ref 36.0–46.0)
Hemoglobin: 6.8 g/dL — ABNORMAL LOW (ref 12.0–15.0)
Immature Granulocytes: 1 %
Lymphocytes Relative: 15 %
Lymphs Abs: 1.3 10*3/uL (ref 0.7–4.0)
MCH: 28.6 pg (ref 26.0–34.0)
MCHC: 34.7 g/dL (ref 30.0–36.0)
MCV: 82.4 fL (ref 80.0–100.0)
Monocytes Absolute: 0.2 10*3/uL (ref 0.1–1.0)
Monocytes Relative: 2 %
Neutro Abs: 6.8 10*3/uL (ref 1.7–7.7)
Neutrophils Relative %: 82 %
Platelets: 42 10*3/uL — ABNORMAL LOW (ref 150–400)
RBC: 2.38 MIL/uL — ABNORMAL LOW (ref 3.87–5.11)
RDW: 15.7 % — ABNORMAL HIGH (ref 11.5–15.5)
WBC: 8.4 10*3/uL (ref 4.0–10.5)
nRBC: 0 % (ref 0.0–0.2)

## 2020-12-21 LAB — GLUCOSE, CAPILLARY
Glucose-Capillary: 314 mg/dL — ABNORMAL HIGH (ref 70–99)
Glucose-Capillary: 391 mg/dL — ABNORMAL HIGH (ref 70–99)
Glucose-Capillary: 407 mg/dL — ABNORMAL HIGH (ref 70–99)
Glucose-Capillary: 378 mg/dL — ABNORMAL HIGH (ref 70–99)
Glucose-Capillary: 400 mg/dL — ABNORMAL HIGH (ref 70–99)
Glucose-Capillary: 337 mg/dL — ABNORMAL HIGH (ref 70–99)

## 2020-12-21 LAB — PREPARE RBC (CROSSMATCH)

## 2020-12-21 MED ORDER — SODIUM CHLORIDE 0.9% IV SOLUTION: Freq: Once | INTRAVENOUS | Status: AC

## 2020-12-21 NOTE — Progress Notes (Signed)
[facility]  PROGRESS NOTE   Subjective:   Patient feeling much better today. He ate well today. Family at bedside.  Patient  Objective:  Vital signs in last 24 hours:  Temp:  [97.7 F (36.5 C)-99.8 F (37.7 C)] 98.9 F (37.2 C) (08/28 1011) Pulse Rate:  [64-100] 87 (08/28 1011) Resp:  [16-21] 16 (08/28 1011) BP: (94-130)/(62-88) 130/69 (08/28 1011) SpO2:  [97 %-100 %] 100 % (08/28 1011)  Weight change:  Filed Weights   12/01/20 1730 12/10/20 1345 12/17/20 1450  Weight: 94.4 kg 94.4 kg 94.4 kg    Intake/Output: I/O last 3 completed shifts: In: 9563 [P.O.:120; Blood:1035] Out: 100 [Other:100]   Intake/Output this shift:  No intake/output data recorded.  Physical Exam: General:  No acute distress  Head:  Normocephalic, atraumatic. Moist oral mucosal membranes  Eyes:  Anicteric  Neck:  Supple  Lungs:   Clear to auscultation, normal effort  Heart:  S1S2 no rubs  Abdomen:   Soft, nontender, bowel sounds present  Extremities:  peripheral edema.  Neurologic:  Awake, alert, following commands  Skin:  No lesions  Access:     Basic Metabolic Panel: Recent Labs  Lab 12/16/20 0516 12/17/20 0346 12/18/20 0505 12/19/20 0704 12/20/20 0519  NA 137 135 136 134* 133*  K 4.0 3.6 3.6 3.5 3.6  CL 100 98 97* 100 98  CO2 30 30 30 28 26   GLUCOSE 119* 187* 136* 197* 378*  BUN 43* 25* 30* 26* 42*  CREATININE 4.99* 3.65* 4.35* 3.88* 4.45*  CALCIUM 7.4* 7.1* 7.2* 6.8* 7.1*  MG 2.0 1.7 1.7 1.5* 1.7    CBC: Recent Labs  Lab 12/17/20 0346 12/18/20 0505 12/19/20 0704 12/20/20 0519 12/21/20 0532  WBC 6.9 5.0 5.2 7.9 8.4  NEUTROABS 5.2 3.3 3.4 5.7 6.8  HGB 9.0* 8.9* 6.3* 7.1* 6.8*  HCT 25.9* 25.4* 18.5* 21.1* 19.6*  MCV 83.5 84.4 85.6 82.7 82.4  PLT 44* 37* 33* 59* 42*     Urinalysis: No results for input(s): COLORURINE, LABSPEC, PHURINE, GLUCOSEU, HGBUR, BILIRUBINUR, KETONESUR, PROTEINUR, UROBILINOGEN, NITRITE, LEUKOCYTESUR in the last 72  hours.  Invalid input(s): APPERANCEUR    Imaging: No results found.   Medications:     sodium chloride   Intravenous Once   amLODipine  5 mg Oral Daily   Chlorhexidine Gluconate Cloth  6 each Topical Daily   feeding supplement  1 Container Oral TID BM   insulin aspart  0-15 Units Subcutaneous TID WC   insulin aspart  0-5 Units Subcutaneous QHS   levothyroxine  100 mcg Oral QAC breakfast   magnesium oxide  800 mg Oral BID   mouth rinse  15 mL Mouth Rinse BID   mirtazapine  15 mg Oral QHS   multivitamin with minerals  1 tablet Oral Daily   pantoprazole  40 mg Oral Daily   pneumococcal 13-valent conjugate vaccine  0.5 mL Intramuscular Tomorrow-1000   predniSONE  80 mg Oral Q breakfast   psyllium  1 packet Oral Daily   rosuvastatin  10 mg Oral QHS   sodium chloride flush  10-40 mL Intracatheter Q12H   thiamine  100 mg Oral Daily   valACYclovir  500 mg Oral Daily    Assessment/ Plan:     Principal Problem:   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[place]) Active Problems:   Personal history of CLL (chronic lymphocytic leukemia)   DDD (degenerative disc disease), cervical   Hypothyroidism   Controlled type 2 diabetes mellitus without complication, without long-term current use  of insulin ([place])   Essential hypertension   Hyperlipidemia, mixed   Acute metabolic encephalopathy   AKI (acute kidney injury) ([place])   Hypomagnesemia   Anemia of chronic disease   Hypokalemia   Thrombocytopenia (HCC)   FUO (fever of unknown origin)   Pancytopenia (HCC)   Oral thrush  55 year old female with history of diabetes, peripheral vascular disease, hypertension, coronary artery disease, hypothyroidism, CLL now admitted with history of mental status changes and found to have acute kidney injury.   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  She was given high-dose steroids and rituximab.  Patient is  presently on hemodialysis on a TTS schedule.  Outpatient dialysis arrangements are being made.   #2: Metabolic acidosis: This has resolved dialysis.  We will stop the sodium bicarbonate.   #3: Anemia: We will continue to monitor closely and follow the anemia protocol.   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   Continue supportive care and will make arrangements for outpatient dialysis. Spoke to the patient and the entire family at bedside and answered all their questions to their satisfaction.   LOS: [place], [facility] 8/28/202210:29 AM

## 2020-12-21 NOTE — Discharge Summary (Addendum)
Physician Discharge Summary  Tonya Myers:[id number] DOB: [birth year] DOA: 11/19/2020  PCP: Tonya Pink, MD  Admit date: 11/19/2020 Discharge date: 12/22/2020  Admitted From: Home Disposition: Home  Recommendations for Outpatient Follow-up:  Follow up with PCP in 1-2 weeks Please obtain BMP/CBC in one week Please follow up with dialysis on Tuesday Thursday Saturday and oncology Friday 12-26-20 as scheduled  Home Health: PT, OT, nursing, aide Equipment/Devices: Harrel Lemon lift  Discharge Condition: Stable CODE STATUS: Full Diet recommendation: As tolerated renal diet  Brief/Interim Summary: Hospital Course: Tonya Myers is a 55 y.o. female with medical history significant for recent pneumococcal meningitis and bacteremia, depression, hypothyroid, IDDM2 who presented to the ED with AMS. She underwent extensive work-up after admission with multiple subspecialty consults. Infectious work-up with ID is negative and ID has signed off on 12/09/2020 (see FUO workup below). She also underwent further work-up with nephrology, oncology, and rheumatology. She has multiple serologies concerning for possible lupus and trying to now obtain renal biopsy to help further diagnose.  Thrombocytopenia has been preventing biopsy obtainment. Due to worsening renal function, she was started on HD.      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  Results 8/26 - P-ANCA mediated necrotizing GN - She does not require plasmapheresis at this time - Resume steroids and continue rituximab (8/19) per consults - Fevers resolved with steroid initiation -Outpatient follow-up with oncology as scheduled 9-22, follow-up with rheumatology outpatient as scheduled   AKI (acute kidney injury) ([place]) transitioning to renal failure requiring HD, ongoing - baseline creat ~0.7 and  renal function has continued to deteriorate - Renal biopsy 12/16/2020 results as above P-ANCA GN as above - Transition to permacath 12/17/2020 - continue HD per nephrology -able to initiate outpatient dialysis Tuesday 8/30   Oral thrush - Resolved - s/p Diflucan x 3 doses   Pancytopenia ([place]) History of CLL  Oncology follow up 9-22 Patient's CLL has been in remission since 2016 Recurrence of hematologic malignancy felt unlikely - likely exacerbated by above --1u PRBC 8/5  - 1u PRBC 8/10 - 1u PRBC 8/18 - 1u PRBC 8/21 - 2u PRBC 8/23 - 1u PRBC 8/26 - 2u PRBC 8/28   FUO (fever of unknown origin) - Likely autoimmune in etiology given above given timing with steroids (always resolves with initiation of steroids) - ID has followed initially and signed off on 8/16 due to lack of any infectious process -reconsulted on 8/23 due to recurrent fevers - negative procalcitonin again likely autoimmune - Cultures remain negative   Thrombocytopenia ([facility]) - also see pancytopenia above - 2 packs platelets 8/17  - 1 pool plt 8/18 - still low on 8/20; [id number] ordered per nephro on 8/20 - 2 pool Platelets on 8/20, 8/22, 8/23 - 1 pool 8/26 for femoral central line removal (plt 33)   Hypokalemia/Hypomagnesemia - Replete and recheck as needed - WNL   Acute metabolic encephalopathy, improving - 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  CT head neg for new stroke.  Hematology ruled out CLL as the culprit.  No seizure on EEG. --Continue vitamin/thiamine repletion. --MRI brain, no acute finding   Hyperlipidemia, mixed - Continue statin   Essential hypertension - Continue amlodipine - Transient hypotension overnight - resolved - hold midodrine - continue steroids   Controlled type 2 diabetes mellitus without complication, without long-term current use  of insulin ([place]) - A1c 6.5% on 10/02/20 -Transiently  elevated with steroids, improving   -Continue sliding scale insulin at home, resume home long-acting insulin as well   Hypothyroidism - Continue Synthroid   Personal history of CLL (chronic lymphocytic leukemia) - in remission since 2016 per oncology  - no relapse seen on peripheral smear performed on 11/25/20 "Unremarkable WBC count with unremarkable WBC morphology. Thrombocytopenia with unremarkable platelet morphology. Findings do not support CLL relapse." - no findings on BM biopsy on 8/11 but repeat biopsy was recommended as well: "Trilineage hematopoiesis (limited material)"   Pancreatitis, POA-resolved as of 12/11/2020 Unclear etiology -no alcohol use, questionably drug-induced but imaging and symptoms negative for gallstone or obstructive etiology, triglycerides within normal limits    Discharge Instructions  Discharge Instructions     Call MD for:  persistant dizziness or light-headedness   Complete by: As directed    Call MD for:  redness, tenderness, or signs of infection (pain, swelling, redness, odor or green/yellow discharge around incision site)   Complete by: As directed    Call MD for:  temperature >100.4   Complete by: As directed    Diet - low sodium heart healthy   Complete by: As directed    Increase activity slowly   Complete by: As directed    No wound care   Complete by: As directed       Allergies as of 12/22/2020       Reactions   Amoxapine    Penicillins         Medication List     STOP taking these medications    diltiazem 120 MG 24 hr capsule Commonly known as: DILACOR XR   DULoxetine 30 MG capsule Commonly known as: Cymbalta   ezetimibe 10 MG tablet Commonly known as: ZETIA   fluconazole 150 MG tablet Commonly known as: DIFLUCAN   metFORMIN 500 MG 24 hr tablet Commonly known as: GLUCOPHAGE-XR   methocarbamol 500 MG tablet Commonly known as: ROBAXIN   methocarbamol 750 MG tablet Commonly known as: ROBAXIN   pregabalin 100  MG capsule Commonly known as: LYRICA   pregabalin 75 MG capsule Commonly known as: LYRICA   traMADol 50 MG tablet Commonly known as: ULTRAM       TAKE these medications    acetaminophen 325 MG tablet Commonly known as: TYLENOL Take 2 tablets (650 mg total) by mouth every 6 (six) hours as needed for mild pain (or Fever >/= 101).   albuterol 108 (90 Base) MCG/ACT inhaler Commonly known as: VENTOLIN HFA Inhale into the lungs.   amLODipine 5 MG tablet Commonly known as: NORVASC Take 1 tablet (5 mg total) by mouth daily.   aspirin EC 81 MG tablet Take 1 tablet (81 mg total) by mouth daily. Swallow whole.   Dexcom G6 Sensor Misc SMARTSIG:1 Each Topical Every 10 Days   diclofenac Sodium 1 % Gel Commonly known as: VOLTAREN Apply 2 g topically 4 (four) times daily.   fluticasone 50 MCG/ACT nasal spray Commonly known as: FLONASE 1 spray 2 (two) times daily.   Insulin Glargine-Lixisenatide 100-33 UNT-MCG/ML Sopn Inject 50 Units into the skin every morning.   insulin lispro 100 UNIT/ML KwikPen Commonly known as: HUMALOG CBG < 70: Take glucose/juice/candy as directed CBG 70 - 120: 0 units CBG 121 - 150: 2 units CBG 151 - 200: 3 units CBG 201 - 250: 5 units CBG 251 - 300: 8 units CBG 301 - 350: 11 units CBG 351 - 400:  15 units CBG > 400: 15 units and recheck in 1 hour - if still >400 repeat 15 units and call PCP   levalbuterol 1.25 MG/3ML nebulizer solution Commonly known as: XOPENEX Take 1.25 mg by nebulization every 6 (six) hours as needed.   levothyroxine 150 MCG tablet Commonly known as: SYNTHROID Take 1 tablet (150 mcg total) by mouth daily before breakfast.   magnesium oxide 400 MG tablet Commonly known as: MAG-OX Take 400 mg by mouth 2 (two) times daily.   mirtazapine 15 MG tablet Commonly known as: REMERON Take 1 tablet (15 mg total) by mouth at bedtime.   montelukast 10 MG tablet Commonly known as: SINGULAIR Take 1 tablet (10 mg total) by mouth at  bedtime.   nystatin 100000 UNIT/ML suspension Commonly known as: MYCOSTATIN Take 5 mLs by mouth 4 (four) times daily.   pantoprazole 40 MG tablet Commonly known as: PROTONIX Take 1 tablet (40 mg total) by mouth daily.   Pen Needles 32G X 4 MM Misc 1 each by Does not apply route 3 (three) times daily before meals.   predniSONE 20 MG tablet Commonly known as: DELTASONE Take 4 tablets (80 mg total) by mouth daily with breakfast for 5 days. Start taking on: December 23, 2020   rosuvastatin 10 MG tablet Commonly known as: CRESTOR Take 1 tablet (10 mg total) by mouth at bedtime.   valACYclovir 1000 MG tablet Commonly known as: Valtrex Take 1 tablet (1,000 mg total) by mouth 2 (two) times daily.               Durable Medical Equipment  (From admission, onward)           Start     Ordered   12/22/20 1104  For home use only DME Other see comment  Once       Comments: Harrel Lemon lift  Question:  Length of Need  Answer:  6 Months   12/22/20 1104            Allergies  Allergen Reactions   Amoxapine    Penicillins     Consultations: Nephrology, oncology, rheumatology, infectious disease   Procedures/Studies: DG Chest 1 View  Result Date: 12/04/2020 CLINICAL DATA:  Cough EXAM: CHEST 1 VIEW COMPARISON:  11/27/2020 FINDINGS: Few faint residual patchy opacities are present in the right mid lung. There is persistent basilar atelectasis as well. Asymmetric elevation of the right hemidiaphragm similar to prior. New left effusion. No right effusion. No pneumothorax. Stable cardiomediastinal contours. Right upper extremity PICC tip terminates at the superior cavoatrial junction. No acute osseous or soft tissue abnormality of the chest wall IMPRESSION: Few faint residual patchy opacities present in the right mid lung, possibly infectious or inflammatory as seen on CT and prior radiograph. New trace left effusion. Right upper extremity PICC terminates at the superior cavoatrial  junction. Electronically Signed   By: Lovena Le M.D.   On: 12/04/2020 19:30   MR BRAIN W WO CONTRAST  Result Date: 11/28/2020 CLINICAL DATA:  Mental status change, unknown cause; encephalopathy of unknown cause; recurrent fever. EXAM: MRI HEAD WITHOUT AND WITH CONTRAST TECHNIQUE: Multiplanar, multiecho pulse sequences of the brain and surrounding structures were obtained without and with intravenous contrast. CONTRAST:  44m GADAVIST GADOBUTROL 1 MMOL/ML IV SOLN COMPARISON:  Head CT November 21, 2020 FINDINGS: Brain: Bilateral T2 hyperintense subdural fluid collections measuring up to 5 mm on the right and 6 mm on the left with mild mass effect on the adjacent brain parenchyma without  midline shift. Postcontrast images are severely degraded by motion. Mild diffuse pachymeningeal contrast enhancement seen. The brain parenchyma has normal morphology and signal characteristics. No acute infarction, hemorrhage, hydrocephalus, or mass lesion. Vascular: Normal flow voids. Skull and upper cervical spine: Normal marrow signal. Sinuses/Orbits: Negative. Other: None. IMPRESSION: Interval development of small bilateral subdural fluid collections, may be related to recent lumbar puncture. Electronically Signed   By: Pedro Earls M.D.   On: 11/28/2020 14:54   MR CERVICAL SPINE W WO CONTRAST  Result Date: 11/28/2020 CLINICAL DATA:  History of epidural abscess and paraspinal abscess in the setting of recurrent fever and altered mental status. EXAM: MRI CERVICAL, THORACIC AND LUMBAR SPINE WITHOUT AND WITH CONTRAST TECHNIQUE: Multiplanar and multiecho pulse sequences of the cervical spine, to include the craniocervical junction and cervicothoracic junction, and thoracic and lumbar spine, were obtained without and with intravenous contrast. CONTRAST:  76m GADAVIST GADOBUTROL 1 MMOL/ML IV SOLN COMPARISON:  MRI of the cervical, thoracic and lumbar spine June 17, 2020. FINDINGS: The study is partially degraded  by motion. Postcontrast axial images are essentially nondiagnostic. MRI CERVICAL SPINE FINDINGS Alignment: Physiologic. Vertebrae: No fracture, evidence of discitis, or bone lesion. Cord: Increased T2 signal within the cord at the C3 level. Posterior Fossa, vertebral arteries, paraspinal tissues: Negative. Disc levels: C2-3: Posterior disc protrusion causing mild mass effect on the cord without significant spinal canal stenosis. Facet degenerative changes resulting mild left neural foraminal narrowing. C3-4: Left posterolateral disc protrusion causing moderate to severe spinal canal stenosis with mass effect on the cord. Increased T2 signal is seen within the cord immediately above the level of compression, likely edema. Uncovertebral and facet degenerative changes resulting in moderate right and severe left neural foraminal narrowing. C4-5: Posterior disc protrusion asymmetric to the left causing mass effect on the cord and resulting in moderate spinal canal stenosis. Uncovertebral and facet degenerative changes resulting in severe bilateral neural foraminal narrowing. C5-6: Posterior disc osteophyte complex causing mass effect on the cord and resulting in moderate spinal canal stenosis. Uncovertebral and facet degenerative changes resulting severe bilateral neural foraminal narrowing. C6-7: Left posterolateral disc protrusion causing indentation of the thecal sac and resulting mild spinal canal stenosis. Uncovertebral and facet degenerative changes resulting in mild bilateral neural foraminal narrowing. C7-T1: Left posterolateral disc protrusion causing indentation on the thecal sac and resulting in mild left neural foraminal narrowing. No significant spinal canal stenosis. MRI THORACIC SPINE FINDINGS Alignment:  Physiologic. Vertebrae: No fracture, evidence of discitis, or bone lesion. Postsurgical changes from T3-4, T5-7 and T9-10 laminectomies. Interval resolution of the large posterior epidural fluid  collection. Cord: Central increased T2 signal within the cord at T6-7, T7-8 and from the T8-9 level to the conus medullaris. Paraspinal and other soft tissues: Small bilateral pleural effusion. A 1.5 cm T2 hyperintense hepatic lesion. Disc levels: Tiny posterior disc protrusions at T3-4, T5-6, T6-7 and T7-8 causing minimal indentation on the thecal sac. No significant spinal canal or neural foraminal stenosis at any thoracic level. MRI LUMBAR SPINE FINDINGS Segmentation:  Standard. Alignment:  Physiologic. Vertebrae: No fracture, evidence of discitis, or bone lesion. Endplate degenerative changes at L5-S1. Conus medullaris and cauda equina: Conus extends to the L1-2 level. Increased T2 signal within the conus medullaris, as described above. 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 Paraspinal and other soft tissues: Negative. Disc levels: T12-L1: No spinal canal or neural foraminal stenosis. L1-2: No spinal canal or neural foraminal stenosis.  L2-3: Shallow disc bulge and moderate facet degenerative changes without significant spinal canal or neural foraminal stenosis. L3-4: Moderate facet degenerative changes. No spinal canal or neural foraminal stenosis. L4-5: Mild loss of disc height, disc bulge and moderate facet degenerative changes without significant spinal canal or neural foraminal stenosis. L5-S1: Loss of disc height, disc bulge with superimposed left central/subarticular disc protrusion and moderate facet degenerative changes. Findings result in narrowing of the left subarticular zone with displacement of the traversing left S1 nerve root. No significant neural foraminal narrowing. IMPRESSION: 1. The study is degraded by motion, particularly the postcontrast images. 2. Interval resolution of the large posterior thoracic epidural fluid collection with postsurgical changes from multilevel laminectomy noted in the thoracic spine. No new fluid  collection identified. 3. Increased T2 signal within the cord centrally at T6-7, T7-8 and from the T8-9 level to the conus medullaris without cord expansion. This may represent post ischemic changes related to now resolved cord compression. 4. 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 5. 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 6. Multilevel high-grade neural foraminal narrowing at the cervical spine. 7. Mild degenerative changes of the thoracic spine without spinal canal or neural foraminal stenosis. 8. Degenerative disc disease at L5-S1 with narrowing of the left subarticular zone and likely impingement of the traversing left S1 nerve root. Electronically Signed   By: Pedro Earls M.D.   On: 11/28/2020 15:41   MR THORACIC SPINE W WO CONTRAST  Result Date: 11/28/2020 CLINICAL DATA:  History of epidural abscess and paraspinal abscess in the setting of recurrent fever and altered mental status. EXAM: MRI CERVICAL, THORACIC AND LUMBAR SPINE WITHOUT AND WITH CONTRAST TECHNIQUE: Multiplanar and multiecho pulse sequences of the cervical spine, to include the craniocervical junction and cervicothoracic junction, and thoracic and lumbar spine, were obtained without and with intravenous contrast. CONTRAST:  47m GADAVIST GADOBUTROL 1 MMOL/ML IV SOLN COMPARISON:  MRI of the cervical, thoracic and lumbar spine June 17, 2020. FINDINGS: The study is partially degraded by motion. Postcontrast axial images are essentially nondiagnostic. MRI CERVICAL SPINE FINDINGS Alignment: Physiologic. Vertebrae: No fracture, evidence of discitis, or bone lesion. Cord: Increased T2 signal within the cord at the C3 level. Posterior Fossa, vertebral arteries, paraspinal tissues: Negative. Disc levels: C2-3: Posterior disc protrusion causing mild mass effect  on the cord without significant spinal canal stenosis. Facet degenerative changes resulting mild left neural foraminal narrowing. C3-4: Left posterolateral disc protrusion causing moderate to severe spinal canal stenosis with mass effect on the cord. Increased T2 signal is seen within the cord immediately above the level of compression, likely edema. Uncovertebral and facet degenerative changes resulting in moderate right and severe left neural foraminal narrowing. C4-5: Posterior disc protrusion asymmetric to the left causing mass effect on the cord and resulting in moderate spinal canal stenosis. Uncovertebral and facet degenerative changes resulting in severe bilateral neural foraminal narrowing. C5-6: Posterior disc osteophyte complex causing mass effect on the cord and resulting in moderate spinal canal stenosis. Uncovertebral and facet degenerative changes resulting severe bilateral neural foraminal narrowing. C6-7: Left posterolateral disc protrusion causing indentation of the thecal sac and resulting mild spinal canal stenosis. Uncovertebral and facet degenerative changes resulting in mild bilateral neural foraminal narrowing. C7-T1: Left posterolateral disc protrusion causing indentation on the thecal sac and resulting in mild left  neural foraminal narrowing. No significant spinal canal stenosis. MRI THORACIC SPINE FINDINGS Alignment:  Physiologic. Vertebrae: No fracture, evidence of discitis, or bone lesion. Postsurgical changes from T3-4, T5-7 and T9-10 laminectomies. Interval resolution of the large posterior epidural fluid collection. Cord: Central increased T2 signal within the cord at T6-7, T7-8 and from the T8-9 level to the conus medullaris. Paraspinal and other soft tissues: Small bilateral pleural effusion. A 1.5 cm T2 hyperintense hepatic lesion. Disc levels: Tiny posterior disc protrusions at T3-4, T5-6, T6-7 and T7-8 causing minimal indentation on the thecal sac. No significant spinal canal or  neural foraminal stenosis at any thoracic level. MRI LUMBAR SPINE FINDINGS Segmentation:  Standard. Alignment:  Physiologic. Vertebrae: No fracture, evidence of discitis, or bone lesion. Endplate degenerative changes at L5-S1. Conus medullaris and cauda equina: Conus extends to the L1-2 level. Increased T2 signal within the conus medullaris, as described above. 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 Paraspinal and other soft tissues: Negative. Disc levels: T12-L1: No spinal canal or neural foraminal stenosis. L1-2: No spinal canal or neural foraminal stenosis. L2-3: Shallow disc bulge and moderate facet degenerative changes without significant spinal canal or neural foraminal stenosis. L3-4: Moderate facet degenerative changes. No spinal canal or neural foraminal stenosis. L4-5: Mild loss of disc height, disc bulge and moderate facet degenerative changes without significant spinal canal or neural foraminal stenosis. L5-S1: Loss of disc height, disc bulge with superimposed left central/subarticular disc protrusion and moderate facet degenerative changes. Findings result in narrowing of the left subarticular zone with displacement of the traversing left S1 nerve root. No significant neural foraminal narrowing. IMPRESSION: 1. The study is degraded by motion, particularly the postcontrast images. 2. Interval resolution of the large posterior thoracic epidural fluid collection with postsurgical changes from multilevel laminectomy noted in the thoracic spine. No new fluid collection identified. 3. Increased T2 signal within the cord centrally at T6-7, T7-8 and from the T8-9 level to the conus medullaris without cord expansion. This may represent post ischemic changes related to now resolved cord compression. 4. 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 5. Advanced degenerative  changes of the cervical spine, with moderate to severe spinal canal stenosis at C3-4 and moderate at C4-5 and C5-6. 6. Multilevel high-grade neural foraminal narrowing at the cervical spine. 7. Mild degenerative changes of the thoracic spine without spinal canal or neural foraminal stenosis. 8. Degenerative disc disease at L5-S1 with narrowing of the left subarticular zone and likely impingement of the traversing left S1 nerve root. Electronically Signed   By: Pedro Earls M.D.   On: 11/28/2020 15:41   MR Lumbar Spine W Wo Contrast  Result Date: 11/28/2020 CLINICAL DATA:  History of epidural abscess and paraspinal abscess in the setting of recurrent fever and altered mental status. EXAM: MRI CERVICAL, THORACIC AND LUMBAR SPINE WITHOUT AND WITH CONTRAST TECHNIQUE: Multiplanar and multiecho pulse sequences of the cervical spine, to include the craniocervical junction and cervicothoracic junction, and thoracic and lumbar spine, were obtained without and with intravenous contrast. CONTRAST:  33m GADAVIST GADOBUTROL 1 MMOL/ML IV SOLN COMPARISON:  MRI of the cervical, thoracic and lumbar spine June 17, 2020. FINDINGS: The study is partially degraded by motion. Postcontrast axial images are essentially nondiagnostic. MRI CERVICAL SPINE FINDINGS Alignment: Physiologic. Vertebrae: No fracture, evidence of discitis, or bone lesion. Cord: Increased T2 signal within the cord at  the C3 level. Posterior Fossa, vertebral arteries, paraspinal tissues: Negative. Disc levels: C2-3: Posterior disc protrusion causing mild mass effect on the cord without significant spinal canal stenosis. Facet degenerative changes resulting mild left neural foraminal narrowing. C3-4: Left posterolateral disc protrusion causing moderate to severe spinal canal stenosis with mass effect on the cord. Increased T2 signal is seen within the cord immediately above the level of compression, likely edema. Uncovertebral and facet  degenerative changes resulting in moderate right and severe left neural foraminal narrowing. C4-5: Posterior disc protrusion asymmetric to the left causing mass effect on the cord and resulting in moderate spinal canal stenosis. Uncovertebral and facet degenerative changes resulting in severe bilateral neural foraminal narrowing. C5-6: Posterior disc osteophyte complex causing mass effect on the cord and resulting in moderate spinal canal stenosis. Uncovertebral and facet degenerative changes resulting severe bilateral neural foraminal narrowing. C6-7: Left posterolateral disc protrusion causing indentation of the thecal sac and resulting mild spinal canal stenosis. Uncovertebral and facet degenerative changes resulting in mild bilateral neural foraminal narrowing. C7-T1: Left posterolateral disc protrusion causing indentation on the thecal sac and resulting in mild left neural foraminal narrowing. No significant spinal canal stenosis. MRI THORACIC SPINE FINDINGS Alignment:  Physiologic. Vertebrae: No fracture, evidence of discitis, or bone lesion. Postsurgical changes from T3-4, T5-7 and T9-10 laminectomies. Interval resolution of the large posterior epidural fluid collection. Cord: Central increased T2 signal within the cord at T6-7, T7-8 and from the T8-9 level to the conus medullaris. Paraspinal and other soft tissues: Small bilateral pleural effusion. A 1.5 cm T2 hyperintense hepatic lesion. Disc levels: Tiny posterior disc protrusions at T3-4, T5-6, T6-7 and T7-8 causing minimal indentation on the thecal sac. No significant spinal canal or neural foraminal stenosis at any thoracic level. MRI LUMBAR SPINE FINDINGS Segmentation:  Standard. Alignment:  Physiologic. Vertebrae: No fracture, evidence of discitis, or bone lesion. Endplate degenerative changes at L5-S1. Conus medullaris and cauda equina: Conus extends to the L1-2 level. Increased T2 signal within the conus medullaris, as described above. 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 Paraspinal and other soft tissues: Negative. Disc levels: T12-L1: No spinal canal or neural foraminal stenosis. L1-2: No spinal canal or neural foraminal stenosis. L2-3: Shallow disc bulge and moderate facet degenerative changes without significant spinal canal or neural foraminal stenosis. L3-4: Moderate facet degenerative changes. No spinal canal or neural foraminal stenosis. L4-5: Mild loss of disc height, disc bulge and moderate facet degenerative changes without significant spinal canal or neural foraminal stenosis. L5-S1: Loss of disc height, disc bulge with superimposed left central/subarticular disc protrusion and moderate facet degenerative changes. Findings result in narrowing of the left subarticular zone with displacement of the traversing left S1 nerve root. No significant neural foraminal narrowing. IMPRESSION: 1. The study is degraded by motion, particularly the postcontrast images. 2. Interval resolution of the large posterior thoracic epidural fluid collection with postsurgical changes from multilevel laminectomy noted in the thoracic spine. No new fluid collection identified. 3. Increased T2 signal within the cord centrally at T6-7, T7-8 and from the T8-9 level to the conus medullaris without cord expansion. This may represent post ischemic changes related to now resolved cord compression. 4. 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 5. 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 6. Multilevel high-grade  neural foraminal narrowing at the cervical spine. 7. Mild degenerative changes of the thoracic spine without spinal canal or neural foraminal stenosis. 8. Degenerative disc disease at L5-S1 with narrowing of the left subarticular zone  and likely impingement of the traversing left S1 nerve root. Electronically Signed   By: Pedro Earls M.D.   On: 11/28/2020 15:41   US RENAL  Result Date: 12/01/2020 CLINICAL DATA:  Acute kidney injury, history CLL, diabetes mellitus, hypertension EXAM: RENAL / URINARY TRACT ULTRASOUND COMPLETE COMPARISON:  CT abdomen and pelvis 11/21/2020 FINDINGS: Right Kidney: Renal measurements: 11.4 x 5.8 x 6.0 cm = volume: 206 mL. Normal cortical thickness. Increased cortical echogenicity. No mass, hydronephrosis, or shadowing calcification. Left Kidney: Renal measurements: 13.1 x 7.3 x 6.1 cm = volume: 3 L4 mL. Normal cortical thickness. Increased cortical echogenicity. Tiny cyst at mid kidney 9 x 13 x 13 mm, simple features. No additional mass, hydronephrosis, or shadowing calcification. Bladder: Partially distended. Cystic intraluminal focus at the posterior wall RIGHT of midline question RIGHT ureterocele approximately 16 x 6 x 9 mm in size. Other: N/A IMPRESSION: Medical renal disease changes of both kidneys. Intraluminal cystic focus posterior RIGHT bladder, favor representing a small RIGHT ureterocele. Electronically Signed   By: Lavonia Dana M.D.   On: 12/01/2020 16:26   PERIPHERAL VASCULAR CATHETERIZATION  Result Date: 12/17/2020 See surgical note for result.  PERIPHERAL VASCULAR CATHETERIZATION  Result Date: 12/10/2020 See surgical note for result.  US Venous Img Lower Bilateral (DVT)  Result Date: 12/05/2020 CLINICAL DATA:  Fever unknown origin EXAM: BILATERAL LOWER EXTREMITY VENOUS DOPPLER ULTRASOUND TECHNIQUE: Gray-scale sonography with compression, as well as color and duplex ultrasound, were performed to evaluate the deep venous system(s) from the level of the common femoral vein through the popliteal and proximal calf veins. COMPARISON:  None. FINDINGS: VENOUS On the left, normal compressibility of the common femoral, superficial femoral, and popliteal veins, as well as the  visualized calf veins. Visualized portions of profunda femoral vein and great saphenous vein unremarkable. No filling defects to suggest DVT on grayscale or color Doppler imaging. Doppler waveforms show normal direction of venous flow, normal respiratory phasicity and response to augmentation. On the right, incomplete compressibility of the proximal popliteal vein with eccentric mural thickening containing linear echogenic regions. There is continued patency of the lumen with flow seen on color Doppler. Common femoral, femoral, and visualized calf veins are unremarkable. OTHER None. Limitations: none IMPRESSION: 1. Negative for acute DVT. 2. 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 Electronically Signed   By: Lucrezia Europe M.D.   On: 12/05/2020 07:25   US Venous Img Upper Bilat (DVT)  Result Date: 12/05/2020 CLINICAL DATA:  Fever of unknown origin, diabetes, numbness EXAM: BILATERAL UPPER EXTREMITY VENOUS DOPPLER ULTRASOUND TECHNIQUE: Gray-scale sonography with graded compression, as well as color Doppler and duplex ultrasound were performed to evaluate the bilateral upper extremity deep venous systems from the level of the subclavian vein and including the jugular, axillary, basilic, radial, ulnar and upper cephalic vein. Spectral Doppler was utilized to evaluate flow at rest and with distal augmentation maneuvers. COMPARISON:  None. FINDINGS: RIGHT UPPER EXTREMITY Internal Jugular Vein: No evidence of thrombus. Normal compressibility, respiratory phasicity and response to augmentation. Subclavian Vein: No evidence of thrombus. Normal compressibility, respiratory phasicity and response to augmentation. Axillary Vein: No evidence of thrombus. Normal compressibility, respiratory phasicity and response to augmentation. Cephalic Vein: Not visualized Basilic Vein: No evidence of thrombus. Normal compressibility,  respiratory phasicity and response to augmentation.  Brachial Veins: No evidence of thrombus. Normal compressibility, respiratory phasicity and response to augmentation. Radial Veins: No evidence of thrombus. Normal compressibility, respiratory phasicity and response to augmentation. Ulnar Veins: No evidence of thrombus. Normal compressibility, respiratory phasicity and response to augmentation. Venous Reflux:  None. Other Findings:  None. LEFT UPPER EXTREMITY Internal Jugular Vein: No evidence of thrombus. Normal compressibility, respiratory phasicity and response to augmentation. Subclavian Vein: No evidence of thrombus. Normal compressibility, respiratory phasicity and response to augmentation. Axillary Vein: No evidence of thrombus. Normal compressibility, respiratory phasicity and response to augmentation. Cephalic Vein: Not visualized Basilic Vein: No evidence of thrombus. Normal compressibility, respiratory phasicity and response to augmentation. Brachial Veins: No evidence of thrombus. Normal compressibility, respiratory phasicity and response to augmentation. Radial Veins: No evidence of thrombus. Normal compressibility, respiratory phasicity and response to augmentation. Ulnar Veins: No evidence of thrombus. Normal compressibility, respiratory phasicity and response to augmentation. Venous Reflux:  None. Other Findings:  None. IMPRESSION: No evidence of DVT within either upper extremity. Electronically Signed   By: Lucrezia Europe M.D.   On: 12/05/2020 07:28   DG Chest Port 1 View  Result Date: 11/27/2020 CLINICAL DATA:  Fever and chest pain. EXAM: PORTABLE CHEST 1 VIEW COMPARISON:  And CT chest, abdomen and pelvis 11/21/2020. Single-view of the chest 10/01/2020. FINDINGS: Subtle micro nodules are seen in the right upper lobe. The left lung demonstrates minimal atelectasis in the base. Heart size is normal. No pneumothorax or pleural fluid. No acute or focal bony abnormality. IMPRESSION: Subtle focus of mild micronodularity in the right upper lobe compatible  with residual infectious or inflammatory process as seen on prior CT. Recommend continued follow-up to clearing. Electronically Signed   By: Inge Rise M.D.   On: 11/27/2020 11:36   EEG adult  Result Date: 11/25/2020 Lora Havens, MD     11/25/2020  5:08 PM Patient Name: Holleigh Crihfield MRN: [id number] Epilepsy Attending: Lora Havens Referring Physician/Provider: Dr Rosalin Hawking Date: 11/25/2020 Duration: 31.44 mins Patient history: 55yo F with ams. EEG to evaluate for seizure Level of alertness: Awake AEDs during EEG study: None Technical aspects: This EEG study was done with scalp electrodes positioned according to the 10-20 International system of electrode placement. Electrical activity was acquired at a sampling rate of 500Hz  and reviewed with a high frequency filter of 70Hz  and a low frequency filter of 1Hz . EEG data were recorded continuously and digitally stored. Description: No posterior dominant rhythm was seen. EEG showed continuous generalized 3 to 6 Hz theta-delta slowing. Physiologic photic driving was not seen during photic stimulation.  Hyperventilation was not performed.   ABNORMALITY - Continuous slow, generalized IMPRESSION: This study is suggestive of moderate diffuse encephalopathy, nonspecific etiology. No seizures or epileptiform discharges were seen throughout the recording. Priyanka [name]   CT BONE MARROW BIOPSY  Result Date: 12/04/2020 CLINICAL DATA:  Chronic lymphocytic leukemia EXAM: CT GUIDED DEEP ILIAC BONE ASPIRATION AND CORE BIOPSY TECHNIQUE: Patient was placed prone on the CT gantry and limited axial scans through the pelvis were obtained. Appropriate skin entry site was identified. Skin site was marked, prepped with chlorhexidine, draped in usual sterile fashion, and infiltrated locally with 1% lidocaine. Patient was given Versed 2 mg IV for anxiolysis during continuous cardiorespiratory monitoring by the radiology RN, during the procedure time of 11 minutes. Under  CT fluoroscopic guidance an 11-gauge Cook trocar bone needle was advanced into the right iliac bone just lateral to the  sacroiliac joint. Once needle tip position was confirmed, aspiration and 2 core samples were obtained, submitted to pathology for approval. Post procedure scans show no hematoma or fracture. Patient tolerated procedure well. COMPLICATIONS: COMPLICATIONS none IMPRESSION: 1. Technically successful CT guided right iliac bone core and aspiration biopsy. Electronically Signed   By: Lucrezia Europe M.D.   On: 12/04/2020 15:53   ECHOCARDIOGRAM COMPLETE  Result Date: 12/05/2020    ECHOCARDIOGRAM REPORT   Patient Name:   KERIANA SARSFIELD Date of Exam: 12/05/2020 Medical Rec #:  [id number]       Height:       72.0 in Accession #:    [id number]      Weight:       208.1 lb Date of Birth:  [birth year]       BSA:          2.167 m Patient Age:    40 years        BP:           136/71 mmHg Patient Gender: F               HR:           80 bpm. Exam Location:  [facility] Procedure: 2D Echo, Color Doppler and Cardiac Doppler Indications:     R50.9 Fever  History:         Patient has prior history of Echocardiogram examinations, most                  recent 06/11/2020. Signs/Symptoms:Fever; Risk                  Factors:Hypertension and Diabetes. Asthma.  Sonographer:     Charmayne Sheer Referring Phys:  [id number] [name] Diagnosing Phys: Yolonda Kida MD  Sonographer Comments: Suboptimal apical window. IMPRESSIONS  1. Left ventricular ejection fraction, by estimation, is 60 to 65%. The left ventricle has normal function. The left ventricle has no regional wall motion abnormalities. Left ventricular diastolic parameters were normal.  2. Right ventricular systolic function is normal. The right ventricular size is normal. Mildly increased right ventricular wall thickness.  3. The mitral valve is normal in structure. No evidence of mitral valve regurgitation.  4. The aortic valve is normal in structure. Aortic valve  regurgitation is not visualized. FINDINGS  Left Ventricle: Left ventricular ejection fraction, by estimation, is 60 to 65%. The left ventricle has normal function. The left ventricle has no regional wall motion abnormalities. The left ventricular internal cavity size was normal in size. There is  borderline concentric left ventricular hypertrophy. Left ventricular diastolic parameters were normal. Right Ventricle: The right ventricular size is normal. Mildly increased right ventricular wall thickness. Right ventricular systolic function is normal. Left Atrium: Left atrial size was normal in size. Right Atrium: Right atrial size was normal in size. Pericardium: There is no evidence of pericardial effusion. Mitral Valve: The mitral valve is normal in structure. No evidence of mitral valve regurgitation. MV peak gradient, 2.6 mmHg. The mean mitral valve gradient is 1.0 mmHg. Tricuspid Valve: The tricuspid valve is normal in structure. Tricuspid valve regurgitation is not demonstrated. Aortic Valve: The aortic valve is normal in structure. Aortic valve regurgitation is not visualized. Aortic valve mean gradient measures 7.0 mmHg. Aortic valve peak gradient measures 12.5 mmHg. Aortic valve area, by VTI measures 2.59 cm. Pulmonic Valve: The pulmonic valve was normal in structure. Pulmonic valve regurgitation is not visualized. Aorta: The ascending aorta was not well visualized. IAS/Shunts:  No atrial level shunt detected by color flow Doppler.  LEFT VENTRICLE PLAX 2D LVIDd:         4.91 cm  Diastology LVIDs:         3.33 cm  LV e' medial:    6.85 cm/s LV PW:         0.94 cm  LV E/e' medial:  10.7 LV IVS:        0.77 cm  LV e' lateral:   7.62 cm/s LVOT diam:     2.10 cm  LV E/e' lateral: 9.6 LV SV:         73 LV SV Index:   34 LVOT Area:     3.46 cm  LEFT ATRIUM             Index LA diam:        3.50 cm 1.62 cm/m LA Vol (A2C):   42.9 ml 19.80 ml/m LA Vol (A4C):   56.8 ml 26.21 ml/m LA Biplane Vol: 51.6 ml 23.81 ml/m   AORTIC VALVE                    PULMONIC VALVE AV Area (Vmax):    2.29 cm     PV Vmax:       1.40 m/s AV Area (Vmean):   2.48 cm     PV Vmean:      92.300 cm/s AV Area (VTI):     2.59 cm     PV VTI:        0.187 m AV Vmax:           177.00 cm/s  PV Peak grad:  7.8 mmHg AV Vmean:          118.000 cm/s PV Mean grad:  4.0 mmHg AV VTI:            0.284 m AV Peak Grad:      12.5 mmHg AV Mean Grad:      7.0 mmHg LVOT Vmax:         117.00 cm/s LVOT Vmean:        84.600 cm/s LVOT VTI:          0.212 m LVOT/AV VTI ratio: 0.75  AORTA Ao Root diam: 2.80 cm MITRAL VALVE MV Area (PHT): 2.74 cm    SHUNTS MV Area VTI:   3.16 cm    Systemic VTI:  0.21 m MV Peak grad:  2.6 mmHg    Systemic Diam: 2.10 cm MV Mean grad:  1.0 mmHg MV Vmax:       0.80 m/s MV Vmean:      56.2 cm/s MV Decel Time: 277 msec MV E velocity: 73.30 cm/s MV A velocity: 66.80 cm/s MV E/A ratio:  1.10 Dwayne Prince Rome MD Electronically signed by Yolonda Kida MD Signature Date/Time: 12/05/2020/6:14:29 PM    Final    US BIOPSY (KIDNEY)  Result Date: 12/16/2020 CLINICAL DATA:  Kidney injury INDICATION: Kidney injury EXAM: Ultrasound-guided random renal biopsy MEDICATIONS: None. ANESTHESIA/SEDATION: Moderate (conscious) sedation was employed during this procedure. A total of Versed 0.5 mg and Fentanyl 25 mcg was administered intravenously. Moderate Sedation Time: 12 minutes. The patient's level of consciousness and vital signs were monitored continuously by radiology nursing throughout the procedure under my direct supervision. FLUOROSCOPY TIME:  N/a COMPLICATIONS: None immediate. PROCEDURE: Informed written consent was obtained from the patient after a thorough discussion of the procedural risks, benefits and alternatives. All questions were addressed. Maximal Sterile Barrier Technique  was utilized including caps, mask, sterile gowns, sterile gloves, sterile drape, hand hygiene and skin antiseptic. A timeout was performed prior to the initiation of the  procedure. The patient was placed supine on the exam table. Limited ultrasound of the bilateral flanks was performed to delineate anatomy. The left lower pole was selected as an appropriate site for random renal biopsy. The overlying skin was marked, prepped and draped in a standard sterile fashion. Local analgesia was obtained with 1% lidocaine. Using ultrasound guidance, a 15 gauge introducer needle was advanced into the left renal pole cortex. Subsequently, core needle biopsy was performed using a 16 gauge core biopsy device x2 passes. Gel-Foam slurry was administered through the introducer needle as it was removed. Limited postprocedure imaging demonstrated expected biopsy changes without large or expanding hematoma. A clean dressing was placed. The patient tolerated the procedure well without immediate complication. IMPRESSION: Successful ultrasound-guided random renal biopsy of the left renal lower pole. Electronically Signed   By: Albin Felling M.D.   On: 12/16/2020 11:51   [place] EKG SITE RITE  Result Date: 11/27/2020 If Site Rite image not attached, placement could not be confirmed due to current cardiac rhythm.    Subjective: No acute issues or events overnight denies nausea vomiting diarrhea constipation headache fevers chills or chest pain   Discharge Exam: Vitals:   12/22/20 0513 12/22/20 0816  BP: 134/77 126/69  Pulse: (!) 55 (!) 52  Resp: 18 16  Temp: 97.9 F (36.6 C) 98.5 F (36.9 C)  SpO2: 97% 96%   Vitals:   12/21/20 2225 12/22/20 0104 12/22/20 0513 12/22/20 0816  BP: 127/79 130/84 134/77 126/69  Pulse: 66 60 (!) 55 (!) 52  Resp: 18 18 18 16   Temp: 97.7 F (36.5 C) 97.8 F (36.6 C) 97.9 F (36.6 C) 98.5 F (36.9 C)  TempSrc: Oral Oral  Oral  SpO2: 99% 98% 97% 96%  Weight:      Height:        General appearance: alert, cooperative, and no distress Head: Normocephalic atraumatic Mouth: no thrush noted Eyes:  EOMI Lungs: clear to auscultation bilaterally Heart:  regular rate and rhythm and S1, S2 normal Abdomen: normal findings: bowel sounds normal and soft, non-tender Extremities: scant edema; pulses intact Skin:  excess skin noted due to weight loss Neurologic: no focal deficits   The results of significant diagnostics from this hospitalization (including imaging, microbiology, ancillary and laboratory) are listed below for reference.     Microbiology: Recent Results (from the past 240 hour(s))  Culture, blood (single) w Reflex to ID Panel     Status: None   Collection Time: 12/14/20  2:36 PM   Specimen: BLOOD  Result Value Ref Range Status   Specimen Description BLOOD BLOOD RIGHT HAND  Final   Special Requests   Final    BOTTLES DRAWN AEROBIC AND ANAEROBIC Blood Culture adequate volume   Culture   Final    NO GROWTH 5 DAYS Performed at [facility], [address], [place], [place] [address]    Report Status 12/19/2020 FINAL  Final  Culture, blood (single) w Reflex to ID Panel     Status: None   Collection Time: 12/14/20  3:38 PM   Specimen: BLOOD  Result Value Ref Range Status   Specimen Description BLOOD A-LINE DRAW  Final   Special Requests   Final    BOTTLES DRAWN AEROBIC AND ANAEROBIC Blood Culture adequate volume   Culture   Final    NO  GROWTH 5 DAYS Performed at [facility], [address] [place], [place] [address]    Report Status 12/19/2020 FINAL  Final  CULTURE, BLOOD (ROUTINE X 2) w Reflex to ID Panel     Status: None (Preliminary result)   Collection Time: 12/18/20 11:44 PM   Specimen: BLOOD LEFT WRIST  Result Value Ref Range Status   Specimen Description BLOOD LEFT WRIST  Final   Special Requests   Final    BOTTLES DRAWN AEROBIC AND ANAEROBIC Blood Culture adequate volume   Culture   Final    NO GROWTH 4 DAYS Performed at [facility], [address], [place], [place] [address]    Report Status PENDING  Incomplete  CULTURE, BLOOD (ROUTINE X 2) w Reflex to ID Panel     Status:  None (Preliminary result)   Collection Time: 12/18/20 11:51 PM   Specimen: BLOOD LEFT HAND  Result Value Ref Range Status   Specimen Description BLOOD LEFT HAND  Final   Special Requests   Final    BOTTLES DRAWN AEROBIC AND ANAEROBIC Blood Culture adequate volume   Culture   Final    NO GROWTH 4 DAYS Performed at [facility], [address], [place], [place] [address]    Report Status PENDING  Incomplete     Labs: BNP (last 3 results) No results for input(s): BNP in the last 8760 hours. Basic Metabolic Panel: Recent Labs  Lab 12/16/20 0516 12/17/20 0346 12/18/20 0505 12/19/20 0704 12/20/20 0519  NA 137 135 136 134* 133*  K 4.0 3.6 3.6 3.5 3.6  CL 100 98 97* 100 98  CO2 30 30 30 28 26   GLUCOSE 119* 187* 136* 197* 378*  BUN 43* 25* 30* 26* 42*  CREATININE 4.99* 3.65* 4.35* 3.88* 4.45*  CALCIUM 7.4* 7.1* 7.2* 6.8* 7.1*  MG 2.0 1.7 1.7 1.5* 1.7   Liver Function Tests: Recent Labs  Lab 12/16/20 0516 12/17/20 0346 12/18/20 0505 12/19/20 0704 12/20/20 0519  AST 53* 70* 57* 56* 49*  ALT 15 18 10 10 10   ALKPHOS 78 84 84 77 82  BILITOT 0.7 0.9 0.9 1.1 0.9  PROT 4.6* 4.8* 5.1* 4.6* 5.1*  ALBUMIN 2.0* 2.1* 2.1* 1.9* 2.2*   No results for input(s): LIPASE, AMYLASE in the last 168 hours. No results for input(s): AMMONIA in the last 168 hours. CBC: Recent Labs  Lab 12/18/20 0505 12/19/20 0704 12/20/20 0519 12/21/20 0532 12/22/20 0518  WBC 5.0 5.2 7.9 8.4 9.4  NEUTROABS 3.3 3.4 5.7 6.8 7.3  HGB 8.9* 6.3* 7.1* 6.8* 9.5*  HCT 25.4* 18.5* 21.1* 19.6* 26.3*  MCV 84.4 85.6 82.7 82.4 81.7  PLT 37* 33* 59* 42* 32*   Cardiac Enzymes: No results for input(s): CKTOTAL, CKMB, CKMBINDEX, TROPONINI in the last 168 hours. BNP: Invalid input(s): POCBNP CBG: Recent Labs  Lab 12/21/20 1921 12/21/20 2145 12/22/20 0811 12/22/20 1155 12/22/20 1556  GLUCAP 378* 337* 289* 356* 332*   D-Dimer No results for input(s): DDIMER in the last 72 hours. Hgb A1c No  results for input(s): HGBA1C in the last 72 hours. Lipid Profile No results for input(s): CHOL, HDL, LDLCALC, TRIG, CHOLHDL, LDLDIRECT in the last 72 hours. Thyroid function studies No results for input(s): TSH, T4TOTAL, T3FREE, THYROIDAB in the last 72 hours.  Invalid input(s): FREET3 Anemia work up No results for input(s): VITAMINB12, FOLATE, FERRITIN, TIBC, IRON, RETICCTPCT in the last 72 hours.  Urinalysis    Component Value Date/Time   COLORURINE YELLOW (A)  12/15/2020 1754   APPEARANCEUR CLOUDY (A) 12/15/2020 1754   LABSPEC 1.013 12/15/2020 1754   PHURINE 9.0 (H) 12/15/2020 1754   GLUCOSEU 50 (A) 12/15/2020 1754   HGBUR LARGE (A) 12/15/2020 1754   BILIRUBINUR NEGATIVE 12/15/2020 1754   KETONESUR NEGATIVE 12/15/2020 1754   PROTEINUR 100 (A) 12/15/2020 1754   NITRITE NEGATIVE 12/15/2020 1754   LEUKOCYTESUR SMALL (A) 12/15/2020 1754   Sepsis Labs Invalid input(s): PROCALCITONIN,  WBC,  LACTICIDVEN Microbiology Recent Results (from the past 240 hour(s))  Culture, blood (single) w Reflex to ID Panel     Status: None   Collection Time: 12/14/20  2:36 PM   Specimen: BLOOD  Result Value Ref Range Status   Specimen Description BLOOD BLOOD RIGHT HAND  Final   Special Requests   Final    BOTTLES DRAWN AEROBIC AND ANAEROBIC Blood Culture adequate volume   Culture   Final    NO GROWTH 5 DAYS Performed at [facility], [address], [place], [place] [address]    Report Status 12/19/2020 FINAL  Final  Culture, blood (single) w Reflex to ID Panel     Status: None   Collection Time: 12/14/20  3:38 PM   Specimen: BLOOD  Result Value Ref Range Status   Specimen Description BLOOD A-LINE DRAW  Final   Special Requests   Final    BOTTLES DRAWN AEROBIC AND ANAEROBIC Blood Culture adequate volume   Culture   Final    NO GROWTH 5 DAYS Performed at [facility], [address] [place], [place] [address]    Report Status 12/19/2020 FINAL  Final  CULTURE, BLOOD  (ROUTINE X 2) w Reflex to ID Panel     Status: None (Preliminary result)   Collection Time: 12/18/20 11:44 PM   Specimen: BLOOD LEFT WRIST  Result Value Ref Range Status   Specimen Description BLOOD LEFT WRIST  Final   Special Requests   Final    BOTTLES DRAWN AEROBIC AND ANAEROBIC Blood Culture adequate volume   Culture   Final    NO GROWTH 4 DAYS Performed at [facility], [address] [place], [place] [address]    Report Status PENDING  Incomplete  CULTURE, BLOOD (ROUTINE X 2) w Reflex to ID Panel     Status: None (Preliminary result)   Collection Time: 12/18/20 11:51 PM   Specimen: BLOOD LEFT HAND  Result Value Ref Range Status   Specimen Description BLOOD LEFT HAND  Final   Special Requests   Final    BOTTLES DRAWN AEROBIC AND ANAEROBIC Blood Culture adequate volume   Culture   Final    NO GROWTH 4 DAYS Performed at [facility], [address], [place],  [address]    Report Status PENDING  Incomplete     Time coordinating discharge: Over 30 minutes  SIGNED:   Little Ishikawa, DO [facility] 12/22/2020, 4:51 PM Pager   If 7PM-7AM, please contact night-coverage [email or web address]

## 2020-12-21 NOTE — Progress Notes (Signed)
Progress Note    Tonya Myers   PRF:[id number]  DOB: January 16, 1966  DOA: 11/19/2020     29  PCP: [name], MD  Initial CC: AMS  Hospital Course: Tonya Myers is a 55 y.o. female with medical history significant for recent pneumococcal meningitis and bacteremia, depression, hypothyroid, IDDM2 who presented to the ED with AMS. She underwent extensive work-up after admission with multiple subspecialty consults. Infectious work-up with ID is negative and ID has signed off on 12/09/2020 (see FUO workup below). She also underwent further work-up with nephrology, oncology, and rheumatology. She has multiple serologies concerning for possible lupus and trying to now obtain renal biopsy to help further diagnose.  Thrombocytopenia has been preventing biopsy obtainment. Due to worsening renal function, she was started on HD.   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  Results 8/26 - P-ANCA mediated necrotizing GN - She does not require plasmapheresis at this time - Resume steroids and continue rituximab (8/19) per consults - Fevers resolved with steroid initiation  AKI (acute kidney injury) ([place]) transitioning to renal failure requiring HD, ongoing - baseline creat ~0.7 and renal function has continued to deteriorate - nephrology following  - Renal biopsy 12/16/2020 results as above P-ANCA GN as above - s/p femoral trialysis on 8/17; started 1st HD session on 8/17 as well -transition to permacath 12/17/2020 - continue HD per nephrology -patient has outpatient dialysis slot as soon as next week Tuesday 8/30  Oral thrush - Resolved --s/p Diflucan for 3 doses  Pancytopenia ([employer]) History of CLL  Oncology following Patient's  CLL has been in remission since 2016 Recurrence of hematologic malignancy felt unlikely - likely exacerbated by above --1u PRBC 8/5  - 1u PRBC 8/10 - 1u PRBC 8/18 - 1u PRBC 8/21 - 2u PRBC 8/23 - 1u PRBC 8/26 - 2u PRBC 8/28  FUO (fever of unknown origin) - Likely autoimmune in etiology given above given timing with steroids (always resolves with initiation of steroids) - ID has followed initially and signed off on 8/16 due to lack of any infectious process -reconsulted on 8/23 due to recurrent fevers - negative procalcitonin again likely autoimmune - Cultures remain negative  Thrombocytopenia (HCC) - also see pancytopenia above - 2 packs platelets 8/17  - 1 pool plt 8/18 - still low on 8/20; [id number] ordered per nephro on 8/20 - 2 pool Platelets on 8/20, 8/22, 8/23 - 1 pool 8/26 for femoral central line removal (plt 33)  Hypokalemia/Hypomagnesemia - Replete and recheck as needed - WNL  Acute metabolic encephalopathy, improving - 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  CT head neg for new stroke.  Hematology ruled out CLL as the culprit.  No seizure on EEG. --Continue vitamin/thiamine repletion. --MRI brain, no acute finding  Hyperlipidemia, mixed - Continue statin  Essential hypertension - Continue amlodipine - Transient hypotension overnight - resolved - hold midodrine - continue steroids  Controlled type 2 diabetes mellitus without complication, without long-term current use of insulin (HCC) - A1c 6.5% on 10/02/20 -Transiently elevated with steroids, improving   -Continue sliding scale insulin, hypoglycemic protocol  Hypothyroidism - Continue Synthroid  Personal history of CLL (chronic lymphocytic leukemia) - in remission since 2016 per oncology  - no relapse seen  on peripheral smear performed on 11/25/20 "Unremarkable WBC count with unremarkable WBC morphology. Thrombocytopenia  with unremarkable platelet morphology. Findings do not support CLL relapse." - no findings on BM biopsy on 8/11 but repeat biopsy was recommended as well: "Trilineage hematopoiesis (limited material)"  Pancreatitis, POA-resolved as of 12/11/2020 Unclear etiology -no alcohol use, questionably drug-induced but imaging and symptoms negative for gallstone or obstructive etiology, triglycerides within normal limits  DVT prophylaxis: Place TED hose Start: 11/19/20 2301   Code Status:   Code Status: Full Code Family Communication: Husband/family at bedside  Disposition Plan: Status is: Inpatient  Remains inpatient appropriate because:Inpatient level of care appropriate due to severity of illness  Dispo: The patient is from: Home              Anticipated d/c is to: Home w [place]              Patient currently stabilizing for discharge   Difficult to place patient No  Risk of unplanned readmission score: Unplanned Admission- Pilot do not use: 55.11   Objective: Blood pressure 130/69, pulse 87, temperature 98.9 F (37.2 C), temperature source Oral, resp. rate 16, height 6' (1.829 m), weight 94.4 kg, SpO2 100 %.   Examination: General appearance: alert, cooperative, and no distress Head: Normocephalic atraumatic Mouth: no thrush noted Eyes:  EOMI Lungs: clear to auscultation bilaterally Heart: regular rate and rhythm and S1, S2 normal Abdomen: normal findings: bowel sounds normal and soft, non-tender Extremities: scant edema; pulses intact Skin:  excess skin noted due to weight loss Neurologic: no focal deficits   Consultants:  Oncology/hematology Rheumatology Nephrology ID  Procedures:  Renal biopsy 12/16/2020  Data Reviewed: I have personally reviewed following labs and imaging studies Results for orders placed or performed during the hospital encounter of 11/19/20 (from the past 24 hour(s))  Glucose, capillary     Status: Abnormal   Collection Time: 12/20/20  1:58 PM  Result  Value Ref Range   Glucose-Capillary 219 (H) 70 - 99 mg/dL  Glucose, capillary     Status: Abnormal   Collection Time: 12/20/20  4:20 PM  Result Value Ref Range   Glucose-Capillary 278 (H) 70 - 99 mg/dL  Glucose, capillary     Status: Abnormal   Collection Time: 12/20/20  8:36 PM  Result Value Ref Range   Glucose-Capillary 329 (H) 70 - 99 mg/dL  CBC with Differential/Platelet     Status: Abnormal   Collection Time: 12/21/20  5:32 AM  Result Value Ref Range   WBC 8.4 4.0 - 10.5 K/uL   RBC 2.38 (L) 3.87 - 5.11 MIL/uL   Hemoglobin 6.8 (L) 12.0 - 15.0 g/dL   HCT 19.6 (L) 36.0 - 46.0 %   MCV 82.4 80.0 - 100.0 fL   MCH 28.6 26.0 - 34.0 pg   MCHC 34.7 30.0 - 36.0 g/dL   RDW 15.7 (H) 11.5 - 15.5 %   Platelets 42 (L) 150 - 400 K/uL   nRBC 0.0 0.0 - 0.2 %   Neutrophils Relative % 82 %   Neutro Abs 6.8 1.7 - 7.7 K/uL   Lymphocytes Relative 15 %   Lymphs Abs 1.3 0.7 - 4.0 K/uL   Monocytes Relative 2 %   Monocytes Absolute 0.2 0.1 - 1.0 K/uL   Eosinophils Relative 0 %   Eosinophils Absolute 0.0 0.0 - 0.5 K/uL   Basophils Relative 0 %   Basophils Absolute 0.0 0.0 - 0.1 K/uL   Immature Granulocytes 1 %   Abs  Immature Granulocytes 0.05 0.00 - 0.07 K/uL  Prepare RBC (crossmatch)     Status: None (Preliminary result)   Collection Time: 12/21/20  7:13 AM  Result Value Ref Range   Order Confirmation      NO CURRENT SAMPLE, MUST ORDER TYPE AND SCREEN Performed at [facility], [address] [place], [place] [address]   Glucose, capillary     Status: Abnormal   Collection Time: 12/21/20  8:42 AM  Result Value Ref Range   Glucose-Capillary 314 (H) 70 - 99 mg/dL    Recent Results (from the past 240 hour(s))  Culture, blood (single) w Reflex to ID Panel     Status: None   Collection Time: 12/14/20  2:36 PM   Specimen: BLOOD  Result Value Ref Range Status   Specimen Description BLOOD BLOOD RIGHT HAND  Final   Special Requests   Final    BOTTLES DRAWN AEROBIC AND ANAEROBIC Blood  Culture adequate volume   Culture   Final    NO GROWTH 5 DAYS Performed at [facility], [address] [place], [place] [address]    Report Status 12/19/2020 FINAL  Final  Culture, blood (single) w Reflex to ID Panel     Status: None   Collection Time: 12/14/20  3:38 PM   Specimen: BLOOD  Result Value Ref Range Status   Specimen Description BLOOD A-LINE DRAW  Final   Special Requests   Final    BOTTLES DRAWN AEROBIC AND ANAEROBIC Blood Culture adequate volume   Culture   Final    NO GROWTH 5 DAYS Performed at [facility], [address] [place], [place] [address]    Report Status 12/19/2020 FINAL  Final  CULTURE, BLOOD (ROUTINE X 2) w Reflex to ID Panel     Status: None (Preliminary result)   Collection Time: 12/18/20 11:44 PM   Specimen: BLOOD LEFT WRIST  Result Value Ref Range Status   Specimen Description BLOOD LEFT WRIST  Final   Special Requests   Final    BOTTLES DRAWN AEROBIC AND ANAEROBIC Blood Culture adequate volume   Culture   Final    NO GROWTH 3 DAYS Performed at [facility], [address] [place], [place] [address]    Report Status PENDING  Incomplete  CULTURE, BLOOD (ROUTINE X 2) w Reflex to ID Panel     Status: None (Preliminary result)   Collection Time: 12/18/20 11:51 PM   Specimen: BLOOD LEFT HAND  Result Value Ref Range Status   Specimen Description BLOOD LEFT HAND  Final   Special Requests   Final    BOTTLES DRAWN AEROBIC AND ANAEROBIC Blood Culture adequate volume   Culture   Final    NO GROWTH 3 DAYS Performed at [facility], [address], [place], [place] [address]    Report Status PENDING  Incomplete     Radiology Studies: No results found. US BIOPSY (KIDNEY)  Final Result    US Venous Img Upper Bilat (DVT)  Final Result    US Venous Img Lower Bilateral (DVT)  Final Result    DG Chest 1 View  Final Result    CT BONE MARROW BIOPSY  Final Result    US RENAL  Final Result    MR  Lumbar Spine W Wo Contrast  Final Result    MR THORACIC SPINE W WO CONTRAST  Final Result    MR CERVICAL SPINE W WO CONTRAST  Final Result  MR BRAIN W WO CONTRAST  Final Result    DG Chest Port 1 View  Final Result    [place] EKG SITE RITE  Final Result    CT HEAD W & WO CONTRAST  Final Result    CT CHEST ABDOMEN PELVIS W CONTRAST  Final Result    DG FL GUIDED LUMBAR PUNCTURE  Final Result    CT Head Wo Contrast  Final Result    DG Chest Portable 1 View  Final Result      Scheduled Meds:  sodium chloride   Intravenous Once   amLODipine  5 mg Oral Daily   Chlorhexidine Gluconate Cloth  6 each Topical Daily   feeding supplement  1 Container Oral TID BM   insulin aspart  0-15 Units Subcutaneous TID WC   insulin aspart  0-5 Units Subcutaneous QHS   levothyroxine  100 mcg Oral QAC breakfast   magnesium oxide  800 mg Oral BID   mouth rinse  15 mL Mouth Rinse BID   mirtazapine  15 mg Oral QHS   multivitamin with minerals  1 tablet Oral Daily   pantoprazole  40 mg Oral Daily   pneumococcal 13-valent conjugate vaccine  0.5 mL Intramuscular Tomorrow-1000   predniSONE  80 mg Oral Q breakfast   psyllium  1 packet Oral Daily   rosuvastatin  10 mg Oral QHS   sodium chloride flush  10-40 mL Intracatheter Q12H   thiamine  100 mg Oral Daily   valACYclovir  500 mg Oral Daily   PRN Meds: acetaminophen **OR** acetaminophen, albuterol, haloperidol lactate, HYDROmorphone (DILAUDID) injection, loperamide, ondansetron **OR** ondansetron (ZOFRAN) IV, ondansetron (ZOFRAN) IV, oxyCODONE, polyethylene glycol, sodium chloride flush Continuous Infusions:   LOS: 31 days  Time spent: Greater than 50% of the 35 minute visit was spent in counseling/coordination of care for the patient as laid out in the A&P.   Little Ishikawa, DO [facility] 12/21/2020, 11:22 AM

## 2020-12-22 ENCOUNTER — Encounter: Payer: Self-pay | Admitting: Nephrology

## 2020-12-22 DIAGNOSIS — N08 Glomerular disorders in diseases classified elsewhere: Secondary | ICD-10-CM

## 2020-12-22 DIAGNOSIS — I776 Arteritis, unspecified: Secondary | ICD-10-CM | POA: Diagnosis not present

## 2020-12-22 DIAGNOSIS — D8989 Other specified disorders involving the immune mechanism, not elsewhere classified: Secondary | ICD-10-CM | POA: Diagnosis not present

## 2020-12-22 LAB — CBC WITH DIFFERENTIAL/PLATELET
Abs Immature Granulocytes: 0.07 10*3/uL (ref 0.00–0.07)
Basophils Absolute: 0 10*3/uL (ref 0.0–0.1)
Basophils Relative: 0 %
Eosinophils Absolute: 0 10*3/uL (ref 0.0–0.5)
Eosinophils Relative: 0 %
HCT: 26.3 % — ABNORMAL LOW (ref 36.0–46.0)
Hemoglobin: 9.5 g/dL — ABNORMAL LOW (ref 12.0–15.0)
Immature Granulocytes: 1 %
Lymphocytes Relative: 17 %
Lymphs Abs: 1.6 10*3/uL (ref 0.7–4.0)
MCH: 29.5 pg (ref 26.0–34.0)
MCHC: 36.1 g/dL — ABNORMAL HIGH (ref 30.0–36.0)
MCV: 81.7 fL (ref 80.0–100.0)
Monocytes Absolute: 0.5 10*3/uL (ref 0.1–1.0)
Monocytes Relative: 5 %
Neutro Abs: 7.3 10*3/uL (ref 1.7–7.7)
Neutrophils Relative %: 77 %
Platelets: 32 10*3/uL — ABNORMAL LOW (ref 150–400)
RBC: 3.22 MIL/uL — ABNORMAL LOW (ref 3.87–5.11)
RDW: 14.3 % (ref 11.5–15.5)
WBC: 9.4 10*3/uL (ref 4.0–10.5)
nRBC: 0 % (ref 0.0–0.2)

## 2020-12-22 LAB — EHRLICHIA ANTIBODY PANEL
E chaffeensis (HGE) Ab, IgG: NEGATIVE
E chaffeensis (HGE) Ab, IgM: NEGATIVE
E. Chaffeensis (HME) IgM Titer: NEGATIVE
E.Chaffeensis (HME) IgG: NEGATIVE

## 2020-12-22 LAB — GLUCOSE, CAPILLARY
Glucose-Capillary: 413 mg/dL — ABNORMAL HIGH (ref 70–99)
Glucose-Capillary: 289 mg/dL — ABNORMAL HIGH (ref 70–99)
Glucose-Capillary: 356 mg/dL — ABNORMAL HIGH (ref 70–99)
Glucose-Capillary: 332 mg/dL — ABNORMAL HIGH (ref 70–99)

## 2020-12-22 LAB — SURGICAL PATHOLOGY

## 2020-12-22 LAB — EPSTEIN BARR VRS(EBV DNA BY PCR): EBV DNA QN by PCR: NEGATIVE IU/mL

## 2020-12-22 MED ORDER — PEN NEEDLES 32G X 4 MM MISC: 1.0000 | Freq: Three times a day (TID) | 0 refills | Status: AC

## 2020-12-22 MED ORDER — AMLODIPINE BESYLATE 5 MG PO TABS: 5.0000 mg | ORAL_TABLET | Freq: Every day | ORAL | 0 refills | Status: DC

## 2020-12-22 MED ORDER — INSULIN LISPRO (1 UNIT DIAL) 100 UNIT/ML (KWIKPEN)
PEN_INJECTOR | SUBCUTANEOUS | 11 refills | Status: DC
Start: 2020-12-22 — End: 2021-12-24

## 2020-12-22 MED ORDER — PREDNISONE 20 MG PO TABS: 80.0000 mg | ORAL_TABLET | Freq: Every day | ORAL | 0 refills | Status: AC

## 2020-12-22 MED ORDER — MIRTAZAPINE 15 MG PO TABS
15.0000 mg | ORAL_TABLET | Freq: Every day | ORAL | 0 refills | Status: DC
Start: 2020-12-22 — End: 2021-12-09

## 2020-12-22 NOTE — Progress Notes (Signed)
Physical Therapy Treatment Patient Details Name: Tonya Myers MRN: [id number] DOB: Jan 13, 1966 Today's Date: 12/22/2020    History of Present Illness Pt admitted to [facility] on 11/19/20 for decreased appetite, vomiting, AMS, and lethargy. On EMS assessment, pt was found to have fever 102 and AMS with confusion and left sided weakness.  Found to have pancytopenia, oncology was consulted due to hx of CLL, but so far no concern of CLL relapse. ID also on board, initially concerning for meningitis due to hx of pneumococcal meningitis.  R IJ HD (permanent) catheter placed 8/24. PMH significant for: DDD, cervical radiculopathy, anxiety, DM, HTN, hypothyroidism, CLL, asthma, cLBP, vertigo, and hx of stroke.    PT Comments    Pt resting in bed upon PT arrival; pt reports only getting OOB to chair via hoyer lift recently; pt agreeable to attempting to get to recliner (pt reports hope to discharge home today).  CGA semi-supine to sitting edge of bed; good sitting balance; pt unable to stand up to walker with bed height elevated, use of RW, and 1 assist; CGA slideboard transfer bed to recliner towards R side with extra effort and time from pt (2 assist to stabilize chair and slideboard; assist to place and remove slideboard; transfer was from level surfaces).  Pt reports having hospital bed, slideboard, manual w/c, and loaned power w/c at home already.  Discussed recommendation for hoyer lift d/t concerns of pt being able to repetitively perform slideboard transfer during day safely: pt and pt's family voicing interest in hoyer lift.  Pt verbalizing concerns regarding transportation home and transportation to/from dialysis.  Based on pt's overall strength, activity tolerance, and assist levels, anticipate slideboard transfers to/from car from uneven surfaces will be very challenging for pt and pt's family.  Currently recommending EMS transport home.  TOC notified regarding therapists recommendation for hoyer lift, EMS  transportation home, and transportation concerns for dialysis.   Follow Up Recommendations  [name]     Equipment Recommendations  Other (comment) (hoyer lift)    Recommendations for Other Services OT consult     Precautions / Restrictions Precautions Precautions: Fall Precaution Comments: R IJ HD perm cath; R PICC; Seizure precaution Restrictions Weight Bearing Restrictions: No    Mobility  Bed Mobility Overal bed mobility: Needs Assistance Bed Mobility: Supine to Sit     Supine to sit: Min guard;HOB elevated     General bed mobility comments: increased effort and time for pt to perform on own; use of bed rails    Transfers Overall transfer level: Needs assistance Equipment used: Rolling walker (2 wheeled);Sliding board Transfers: Sit to/from Stand;Lateral/Scoot Transfers Sit to Stand: From elevated surface;Total assist         General transfer comment: sit to stand: unable to clear bottom from bed with 1 assist, RW use, and bed height elevated; CGA with slideboard transfer bed to recliner towards R side (assist to stabilize slideboard and recliner)--increased effort and time to perform  Ambulation/Gait             General Gait Details: unable to stand to attempt   Stairs             Wheelchair Mobility    Modified Rankin (Stroke Patients Only)       Balance Overall balance assessment: Needs assistance Sitting-balance support: No upper extremity supported;Feet supported Sitting balance-Leahy Scale: Good Sitting balance - Comments: steady sitting reaching within BOS  Cognition Arousal/Alertness: Awake/alert Behavior During Therapy: WFL for tasks assessed/performed Overall Cognitive Status: Within Functional Limits for tasks assessed                                 General Comments: Increased time to perform tasks      Exercises      General Comments  Nursing cleared pt for  participation in physical therapy.  Pt agreeable to PT session.  Pt's family present during session.      Pertinent Vitals/Pain Pain Assessment: No/denies pain Faces Pain Scale: No hurt Pain Intervention(s): Limited activity within patient's tolerance;Monitored during session;Repositioned Vitals (HR and O2 on room air) stable and WFL throughout treatment session.    Home Living                      Prior Function            PT Goals (current goals can now be found in the care plan section) Acute Rehab PT Goals Patient Stated Goal: to improve strength and mobility PT Goal Formulation: With patient/family Time For Goal Achievement: 12/25/20 Potential to Achieve Goals: Fair Progress towards PT goals: Progressing toward goals    Frequency    Min 2X/week      PT Plan Current plan remains appropriate (pt prefers discharge home with family assist)    Co-evaluation              AM-PAC PT "6 Clicks" Mobility   Outcome Measure  Help needed turning from your back to your side while in a flat bed without using bedrails?: A Little Help needed moving from lying on your back to sitting on the side of a flat bed without using bedrails?: A Little Help needed moving to and from a bed to a chair (including a wheelchair)?: A Lot Help needed standing up from a chair using your arms (e.g., wheelchair or bedside chair)?: Total Help needed to walk in hospital room?: Total Help needed climbing 3-5 steps with a railing? : Total 6 Click Score: 11    End of Session Equipment Utilized During Treatment: Gait belt;Other (comment) (slideboard) Activity Tolerance: Patient tolerated treatment well Patient left: in chair;with call bell/phone within reach;with chair alarm set;with family/visitor present Nurse Communication: Mobility status;Precautions;Other (comment) (assist levels; transfer technique) PT Visit Diagnosis: Unsteadiness on feet (R26.81);Muscle weakness (generalized)  (M62.81);Difficulty in walking, not elsewhere classified (R26.2);Hemiplegia and hemiparesis Hemiplegia - Right/Left: Right     Time: 2440-1027 PT Time Calculation (min) (ACUTE ONLY): 38 min  Charges:  $Therapeutic Activity: 38-52 mins                    Leitha Bleak, PT 12/22/20, 9:35 AM

## 2020-12-22 NOTE — Progress Notes (Signed)
ID Pt says she is doing better Waiting to go home Husband at bedside       Date 7/27 7/28 7/29 7/30 7/31 8/1 8/2 8/3  Tmax /- 102.6 98.8 98.9 100.4 99.5 99.9 99.5 103  antibiotic Cef,vanco,acyclovir CTX,VAN CTX/VAN CTX/Va          steroid Decadron Decadron Decadron Decadron          culture BLD-N CSF-N              Date 8/4 8/5 8/6 8/7 8/8 8/9 8/10 8/11  TMAX 98.6 99.7 102.3 101 100.7 100.6 102.1 100.2  ABX PICC     cipro Cipr doxy Cipr doxy doxy    Culture BLD-N Urine-EA                DATE 12/05/20  8/13-8/17  8/18  8/21-8/25  8/26>        TMAX 101.8  Normal  100.9  102.3  N        ABX                  culture                  High dose solumedrol 8/12-8/14 Restarted steroids on 8/26 12/12/20 1 gram of Rituximab  O/e awake and alert Afebrile Patient Vitals for the past 24 hrs:  BP Temp Temp src Pulse Resp SpO2  12/22/20 1752 134/83 98.5 F (36.9 C) Oral 79 16 98 %  12/22/20 0816 126/69 98.5 F (36.9 C) Oral (!) 52 16 96 %  12/22/20 0513 134/77 97.9 F (36.6 C) -- (!) 55 18 97 %  12/22/20 0104 130/84 97.8 F (36.6 C) Oral 60 18 98 %  12/21/20 2225 127/79 97.7 F (36.5 C) Oral 66 18 99 %  12/21/20 2158 125/75 98.1 F (36.7 C) Oral 69 18 98 %  12/21/20 1914 119/67 98.3 F (36.8 C) -- 89 20 99 %  12/21/20 1900 119/67 98.3 F (36.8 C) Oral 89 20 --    Chest b/l air entry pale [id number] Abd soft Cns- weakness legs  labs CBC Latest Ref Rng & Units 12/22/2020 12/21/2020 12/20/2020  WBC 4.0 - 10.5 K/uL 9.4 8.4 7.9  Hemoglobin 12.0 - 15.0 g/dL 9.5(L) 6.8(L) 7.1(L)  Hematocrit 36.0 - 46.0 % 26.3(L) 19.6(L) 21.1(L)  Platelets 150 - 400 K/uL 32(L) 42(L) 59(L)     CMP Latest Ref Rng & Units 12/20/2020 12/19/2020 12/18/2020  Glucose 70 - 99 mg/dL 378(H) 197(H) 136(H)  BUN 6 - 20 mg/dL 42(H) 26(H) 30(H)  Creatinine 0.44 - 1.00 mg/dL 4.45(H) 3.88(H) 4.35(H)  Sodium 135 - 145 mmol/L 133(L) 134(L) 136  Potassium 3.5 - 5.1 mmol/L 3.6 3.5 3.6  Chloride 98 - 111 mmol/L 98 100  97(L)  CO2 22 - 32 mmol/L 26 28 30   Calcium 8.9 - 10.3 mg/dL 7.1(L) 6.8(L) 7.2(L)  Total Protein 6.5 - 8.1 g/dL 5.1(L) 4.6(L) 5.1(L)  Total Bilirubin 0.3 - 1.2 mg/dL 0.9 1.1 0.9  Alkaline Phos 38 - 126 U/L 82 77 84  AST 15 - 41 U/L 49(H) 56(H) 57(H)  ALT 0 - 44 U/L 10 10 10       Infectious disease work up 11/19/2020 blood culture no growth 11/20/2020 CSF culture no growth HSV PCR in CSF nonreactive VDRL nonreactive Crypto antigen nonreactive 11/12/2020 meningitis encephalitis panel nonreactive. 11/27/2020 urine culture Enterobacter treated 12/17/20 CMV PCR 204 12/17/20 EBV -PCR NEG 12/17/20 Fungitell N    11/27/2020 blood culture no growth  12/06/20 BC- NG 12/14/20 BC-NG UA wbc 21-50 RBC > 50  Impression/recommendation    Newly diagnosed P-ANCA vasculitis- causing  FUO thrombocytopenia, AKI Got high dose steroids and then rituximab 1 dose  AKI leading to dialysis- due to P ANCA related crescentic GN Back on steroids If she is going to be on high dose steroids > 23m for 30 days or more will need PCP prophylaxis with bactrim  Patient presented with encephalopathy and pancytopenia on 11/19/2020.  Had fever on admission.  Lumbar puncture revealed increased protein  but the cell count was only 5  albumino cellular dissociation   Meningitis encephalitis PCR panel was negative and infection was ruled out.  Cytology was negative. Encephalopathy resolved.    She was noted to have pancreatitis incidental finding on CT.  She did not have much of abdominal pain.  FUO due to P ANCA vasculitis .  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  she did not have any pyelonephritis.  Cortisol and TSH was normal.  She was also given a trial of doxycycline for 3 days for any tickborne illness with no improvement. Likely fever from the underlying autoimmune process- had resolved with steroids and now back again No antibiotics since 12/04/20 Will observe closely off antibiotics as she is stable. Recommend CXR   AKI .  Initially thought to be contrast-induced.  That the work-up revealed very low C3-C4, ANA being positive, and positive for p-ANCA, mildly elevated dsDNA and Smith antibody.. Vasculitis VS acute lupus Received 1 gram soumedrol X 3 doses Started dialysis 1 dose of rituximab   Underwent renal biopsy today  Thrombocytopenia   History of CLL in remission. Because of FUO relapse was questioned and she underwent a bone marrow biopsy.  The reading was the bone marrow material was extremely limited for evaluation but appears to be trilineage hematopoiesis with relative abundance of granulocytic cells.  There was no apparent morphological evidence of a lymphoproliferative process.  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  She needed surgical intervention and also completed 6 weeks of IV antibiotic.  There is no residual infection now.    ID will sign off- call if needed

## 2020-12-22 NOTE — Progress Notes (Signed)
Inpatient Diabetes Program Recommendations  AACE/ADA: New Consensus Statement on Inpatient Glycemic Control (2015)  Target Ranges:  Prepandial:   less than 140 mg/dL      Peak postprandial:   less than 180 mg/dL (1-2 hours)      Critically ill patients:  140 - 180 mg/dL  Results for Tonya Myers, Tonya Myers (MRN [id number]) as of 12/22/2020 09:20  Ref. Range 12/21/2020 08:42 12/21/2020 11:45 12/21/2020 17:14 12/21/2020 19:13 12/21/2020 19:21 12/21/2020 21:45  Glucose-Capillary Latest Ref Range: 70 - 99 mg/dL 314 (H)  11 units Novolog @1005  391 (H)  15 units Novolog @1252  407 (H)  15 units Novolog @1614   10 units Novolog @1753  400 (H) 378 (H) 337 (H)  4 units Novolog   Results for Tonya Myers, Tonya Myers (MRN [id number]) as of 12/22/2020 09:20  Ref. Range 12/22/2020 08:11  Glucose-Capillary Latest Ref Range: 70 - 99 mg/dL 289 (H)     Home DM Meds: Soliqua 50 units QAM      Metformin 1000 mg daily  Current Orders: Novolog Moderate Correction Scale/ SSI (0-15 units) TID AC + HS    MD- Note Prednisone 80 mg Daily added 08/26.  CBGs severely elevated due to steroids.  Please consider the following while pt remains on Prednisone:  1. Start 10 units Levemir Daily (0.1 units/kg)  2. Start Novolog Meal Coverage: Novolog 4 units TID with meals Hold if pt eats <50% of meal, Hold if pt NPO   --Will follow patient during hospitalization--  Wyn Quaker RN, MSN, CDE Diabetes Coordinator Inpatient Glycemic Control Team Team Pager: [phone] (8a-5p)

## 2020-12-22 NOTE — TOC Progression Note (Addendum)
Transition of Care [facility]) - Progression Note    Patient Details  Name: Tonya Myers MRN: [id number] Date of Birth: 02-06-1966  Transition of Care [facility]) [name] [place], [name] Phone Number: 12/22/2020, 11:29 AM  Clinical Narrative:  Ordered hoyer lift through [place]. Will need hoyer pad to go to HD tomorrow. [name] representative will follow up once she finds out if delivery will be today or tomorrow. Will need EMS transport home. Confirmed address on facesheet is complete. Patient requesting transport for HD. Family confirmed they can transport until it can be arranged. Since she does not have Medicaid, [place] requires private pay of $110 roundtrip. [facility] will try to bill insurance but if they will not cover it is $100 roundtrip. Left voicemail for [employer]. [employer] is not charging to transport right now due to [place]. They can start next Tuesday but do not transport on Saturdays. Patient and family aware and agreeable. They will have someone come out to the home for assessment to make sure their vehicle can fit, turn around, etc.  1:37 pm: Per [name] representative, hoyer lift is scheduled to be delivered to the home today. Husband asked his brother to wait at their home. Husband will call CSW once they have heard about an estimated delivery time.  Expected Discharge Plan: [place] Barriers to Discharge: Continued Medical Work up  Expected Discharge Plan and Services Expected Discharge Plan: [place]   Discharge Planning Services: CM Consult Post Acute Care Choice: [place], [name] Living arrangements for the past 2 months: Single Family Home Expected Discharge Date: 12/22/20               DME Arranged: N/A DME Agency: NA       HH Arranged: PT, OT [place] Agency: [place] ([facility]) Date HH Agency Contacted: 11/24/20 Time [place]: 1419 Representative spoke with at [place]: [place] ([facility]) Interventions    Readmission Risk Interventions Readmission Risk Prevention Plan 11/24/2020  Transportation Screening Complete  PCP or Specialist Appt within 3-5 Days Complete  HRI or [place] Complete  Social Work Consult for [place] Planning/Counseling Complete  Palliative Care Screening Not Applicable  Medication Review Press photographer) Complete  Some recent data might be hidden

## 2020-12-22 NOTE — Progress Notes (Signed)
Removed picc line to right upper arm. Placed gauze with tegaderm for pressure. Changed dressing to right groin, site was dry/intact. Patient wanted dressing covering area with gauze/tegaderm. Ready for transport. Bed in low position and call bell in reach.

## 2020-12-22 NOTE — Progress Notes (Signed)
[facility]  ROUNDING NOTE   Subjective:   Patient sitting up in chair Alert and oriented Husband at bedside Concerned about weakness developed during hospital    Objective:  Vital signs in last 24 hours:  Temp:  [97.7 F (36.5 C)-98.7 F (37.1 C)] 98.5 F (36.9 C) (08/29 0816) Pulse Rate:  [52-89] 52 (08/29 0816) Resp:  [16-20] 16 (08/29 0816) BP: (119-134)/(64-84) 126/69 (08/29 0816) SpO2:  [96 %-99 %] 96 % (08/29 0816)  Weight change:  Filed Weights   12/01/20 1730 12/10/20 1345 12/17/20 1450  Weight: 94.4 kg 94.4 kg 94.4 kg    Intake/Output: I/O last 3 completed shifts: In: 1210.3 [I.V.:20; Blood:1190.3] Out: -    Intake/Output this shift:  No intake/output data recorded.  Physical Exam: General: NAD, sitting in chair  Head: Normocephalic, atraumatic. Moist oral mucosal membranes  Eyes: Anicteric  Lungs:  Clear to auscultation, normal effort  Heart: Regular rate and rhythm  Abdomen:  Soft, nontender  Extremities: trace peripheral edema.  Neurologic: Alert, moving all four extremities  Skin: No lesions  Access Rt Permcath placed on 16/07/37    Basic Metabolic Panel: Recent Labs  Lab 12/16/20 0516 12/17/20 0346 12/18/20 0505 12/19/20 0704 12/20/20 0519  NA 137 135 136 134* 133*  K 4.0 3.6 3.6 3.5 3.6  CL 100 98 97* 100 98  CO2 _0 GLUCOSE 119* 187* 136* 197* 378*  BUN 43* 25* 30* 26* 42*  CREATININE 4.99* 3.65* 4.35* 3.88* 4.45*  CALCIUM 7.4* 7.1* 7.2* 6.8* 7.1*  MG 2.0 1.7 1.7 1.5* 1.7     Liver Function Tests: Recent Labs  Lab 12/16/20 0516 12/17/20 0346 12/18/20 0505 12/19/20 0704 12/20/20 0519  AST 53* 70* 57* 56* 49*  ALT _1 ALKPHOS 78 84 84 77 82  BILITOT 0.7 0.9 0.9 1.1 0.9  PROT 4.6* 4.8* 5.1* 4.6* 5.1*  ALBUMIN 2.0* 2.1* 2.1* 1.9* 2.2*    No results for input(s): LIPASE, AMYLASE in the last 168 hours.  No results for input(s): AMMONIA in the last 168 hours.  CBC: Recent Labs   Lab 12/18/20 0505 12/19/20 0704 12/20/20 0519 12/21/20 0532 12/22/20 0518  WBC 5.0 5.2 7.9 8.4 9.4  NEUTROABS 3.3 3.4 5.7 6.8 7.3  HGB 8.9* 6.3* 7.1* 6.8* 9.5*  HCT 25.4* 18.5* 21.1* 19.6* 26.3*  MCV 84.4 85.6 82.7 82.4 81.7  PLT 37* 33* 59* 42* 32*     Cardiac Enzymes: No results for input(s): CKTOTAL, CKMB, CKMBINDEX, TROPONINI in the last 168 hours.   BNP: Invalid input(s): POCBNP  CBG: Recent Labs  Lab 12/21/20 1913 12/21/20 1921 12/21/20 2145 12/22/20 0811 12/22/20 1155  GLUCAP 400* 378* 337* 289* 356*     Microbiology: Results for orders placed or performed during the hospital encounter of 11/19/20  Blood culture (routine x 2)     Status: None   Collection Time: 11/19/20  4:47 PM   Specimen: BLOOD  Result Value Ref Range Status   Specimen Description BLOOD RIGHT ANTECUBITAL  Final   Special Requests   Final    BOTTLES DRAWN AEROBIC AND ANAEROBIC Blood Culture adequate volume   Culture   Final    NO GROWTH 5 DAYS Performed at [facility], [address], [place], [place] [address]    Report Status 11/24/2020 FINAL  Final  Resp Panel by RT-PCR (Flu A&B, Covid) Nasopharyngeal Swab     Status: None   Collection Time: 11/19/20  4:48 PM   Specimen: Nasopharyngeal Swab; Nasopharyngeal(NP) swabs in vial transport medium  Result Value Ref Range Status   SARS Coronavirus 2 by RT PCR NEGATIVE NEGATIVE Final    Comment: (NOTE) SARS-CoV-2 target nucleic acids are NOT DETECTED.  The SARS-CoV-2 RNA is generally detectable in upper respiratory specimens during the acute phase of infection. The lowest concentration of SARS-CoV-2 viral copies this assay can detect is 138 copies/mL. A negative result does not preclude SARS-Cov-2 infection and should not be used as the sole basis for treatment or other patient management decisions. A negative result may occur with  improper specimen collection/handling, submission of specimen other than nasopharyngeal  swab, presence of viral mutation(s) within the areas targeted by this assay, and inadequate number of viral copies(<138 copies/mL). A negative result must be combined with clinical observations, patient history, and epidemiological information. The expected result is Negative.  Fact Sheet for Patients:  EntrepreneurPulse.com.au  Fact Sheet for Healthcare Providers:  IncredibleEmployment.be  This test is no t yet approved or cleared by the [place] FDA and  has been authorized for detection and/or diagnosis of SARS-CoV-2 by FDA under an Emergency Use Authorization (EUA). This EUA will remain  in effect (meaning this test can be used) for the duration of the COVID-19 declaration under Section 564(b)(1) of the Act, 21 U.S.C.section 360bbb-3(b)(1), unless the authorization is terminated  or revoked sooner.       Influenza A by PCR NEGATIVE NEGATIVE Final   Influenza B by PCR NEGATIVE NEGATIVE Final    Comment: (NOTE) The Xpert Xpress SARS-CoV-2/FLU/RSV plus assay is intended as an aid in the diagnosis of influenza from Nasopharyngeal swab specimens and should not be used as a sole basis for treatment. Nasal washings and aspirates are unacceptable for Xpert Xpress SARS-CoV-2/FLU/RSV testing.  Fact Sheet for Patients: EntrepreneurPulse.com.au  Fact Sheet for Healthcare Providers: IncredibleEmployment.be  This test is not yet approved or cleared by the [place] FDA and has been authorized for detection and/or diagnosis of SARS-CoV-2 by FDA under an Emergency Use Authorization (EUA). This EUA will remain in effect (meaning this test can be used) for the duration of the COVID-19 declaration under Section 564(b)(1) of the Act, 21 U.S.C. section 360bbb-3(b)(1), unless the authorization is terminated or revoked.  Performed at [facility], [address] [place], [place] [address]   Blood  culture (routine x 2)     Status: None   Collection Time: 11/19/20  6:46 PM   Specimen: BLOOD  Result Value Ref Range Status   Specimen Description BLOOD RIGHT ANTECUBITAL  Final   Special Requests   Final    BOTTLES DRAWN AEROBIC AND ANAEROBIC Blood Culture adequate volume   Culture   Final    NO GROWTH 5 DAYS Performed at [facility], [address], [place], [place] [address]    Report Status 11/24/2020 FINAL  Final  Urine Culture     Status: Abnormal   Collection Time: 11/19/20  9:33 PM   Specimen: Urine, Clean Catch  Result Value Ref Range Status   Specimen Description   Final    URINE, CLEAN CATCH Performed at [facility], [address], [place], [place] [address]    Special Requests   Final    NONE Performed at [facility], [address] [place], [place] [address]    Culture (A)  Final    <10,000 COLONIES/mL INSIGNIFICANT GROWTH Performed at [facility], [address] [place], [place]  [address]    Report Status 11/21/2020 FINAL  Final  Culture, fungus without smear     Status: None   Collection Time: 11/20/20 11:09 AM   Specimen: CSF; Cerebrospinal Fluid  Result Value Ref Range Status   Specimen Description   Final    CSF Performed at [facility], [address], [place], [place] [address]    Special Requests   Final    NONE Performed at [facility], [address], [place], [place] [address]    Culture   Final    NO FUNGUS ISOLATED AFTER 21 DAYS Performed at [facility], [address], [place], [place] [address]    Report Status 12/11/2020 FINAL  Final  CSF culture w Gram Stain     Status: None   Collection Time: 11/20/20 11:09 AM   Specimen: PATH Cytology CSF; Cerebrospinal Fluid  Result Value Ref Range Status   Specimen Description   Final    CSF Performed at [facility], [address], [place], [place] [address]    Special Requests   Final    NONE Performed at  [facility], [address] [place], [place] [address]    Gram Stain   Final    NO ORGANISMS SEEN WBC SEEN RED BLOOD CELLS PRESENT Performed at [facility], [address] [place], [place] [address]    Culture   Final    NO GROWTH 3 DAYS Performed at [facility], [address], [place], [place] [address]    Report Status 11/23/2020 FINAL  Final  Fungus Culture With Stain     Status: None (Preliminary result)   Collection Time: 11/20/20 11:09 AM   Specimen: PATH Cytology CSF; Cerebrospinal Fluid  Result Value Ref Range Status   Fungus Stain Final report  Final    Comment: (NOTE) Performed At: [facility] [address] [place], [place] [id number] Jolene Schimke MD [id number]    Fungus (Mycology) Culture PENDING  Incomplete   Fungal Source CSF  Final    Comment: Performed at [facility], [address] [place], [place] [address]  Fungus Culture Result     Status: None   Collection Time: 11/20/20 11:09 AM  Result Value Ref Range Status   Result 1 Comment  Final    Comment: (NOTE) KOH/Calcofluor preparation:  no fungus observed. Performed At: [facility] [address] [place], [place] [id number] Jolene Schimke MD [id number]   Resp Panel by RT-PCR (Flu A&B, Covid) Nasopharyngeal Swab     Status: None   Collection Time: 11/27/20  1:39 PM   Specimen: Nasopharyngeal Swab; Nasopharyngeal(NP) swabs in vial transport medium  Result Value Ref Range Status   SARS Coronavirus 2 by RT PCR NEGATIVE NEGATIVE Final    Comment: (NOTE) SARS-CoV-2 target nucleic acids are NOT DETECTED.  The SARS-CoV-2 RNA is generally detectable in upper respiratory specimens during the acute phase of infection. The lowest concentration of SARS-CoV-2 viral copies this assay can detect is 138 copies/mL. A negative result does not preclude SARS-Cov-2 infection and should not be used as the sole basis for treatment or other patient  management decisions. A negative result may occur with  improper specimen collection/handling, submission of specimen other than nasopharyngeal swab, presence of viral mutation(s) within the areas targeted by this assay, and inadequate number of viral copies(<138 copies/mL). A negative result must be combined with clinical observations, patient history, and epidemiological information. The expected result is Negative.  Fact Sheet for Patients:  EntrepreneurPulse.com.au  Fact Sheet for Healthcare Providers:  IncredibleEmployment.be  This test is no t yet approved or cleared by the [place] FDA and  has been authorized for detection and/or diagnosis of SARS-CoV-2 by FDA under an Emergency Use Authorization (EUA). This EUA will remain  in effect (meaning this test can be used) for the duration of the COVID-19 declaration under Section 564(b)(1) of the Act, 21 U.S.C.section 360bbb-3(b)(1), unless the authorization is terminated  or revoked sooner.       Influenza A by PCR NEGATIVE NEGATIVE Final   Influenza B by PCR NEGATIVE NEGATIVE Final    Comment: (NOTE) The Xpert Xpress SARS-CoV-2/FLU/RSV plus assay is intended as an aid in the diagnosis of influenza from Nasopharyngeal swab specimens and should not be used as a sole basis for treatment. Nasal washings and aspirates are unacceptable for Xpert Xpress SARS-CoV-2/FLU/RSV testing.  Fact Sheet for Patients: EntrepreneurPulse.com.au  Fact Sheet for Healthcare Providers: IncredibleEmployment.be  This test is not yet approved or cleared by the [place] FDA and has been authorized for detection and/or diagnosis of SARS-CoV-2 by FDA under an Emergency Use Authorization (EUA). This EUA will remain in effect (meaning this test can be used) for the duration of the COVID-19 declaration under Section 564(b)(1) of the Act, 21 U.S.C. section 360bbb-3(b)(1),  unless the authorization is terminated or revoked.  Performed at [facility], [address] [place], [place] [address]   Urine Culture     Status: Abnormal   Collection Time: 11/27/20  2:15 PM   Specimen: Kidney; Urine  Result Value Ref Range Status   Specimen Description   Final    URINE, CLEAN CATCH Performed at [facility], [address] [place], [place] [address]    Special Requests   Final    NONE Performed at [facility], [place], [place] [address]    Culture 20,000 COLONIES/mL ENTEROBACTER AEROGENES (A)  Final   Report Status 11/30/2020 FINAL  Final   Organism ID, Bacteria ENTEROBACTER AEROGENES (A)  Final      Susceptibility   Enterobacter aerogenes - MIC*    CEFAZOLIN >=64 RESISTANT Resistant     CEFEPIME 1 SENSITIVE Sensitive     CEFTRIAXONE >=64 RESISTANT Resistant     CIPROFLOXACIN <=0.25 SENSITIVE Sensitive     GENTAMICIN <=1 SENSITIVE Sensitive     IMIPENEM <=0.25 SENSITIVE Sensitive     NITROFURANTOIN 64 INTERMEDIATE Intermediate     TRIMETH/SULFA <=20 SENSITIVE Sensitive     PIP/TAZO >=128 RESISTANT Resistant     * 20,000 COLONIES/mL ENTEROBACTER AEROGENES  CULTURE, BLOOD (ROUTINE X 2) w Reflex to ID Panel     Status: None   Collection Time: 11/27/20  8:23 PM   Specimen: BLOOD  Result Value Ref Range Status   Specimen Description BLOOD LAC  Final   Special Requests   Final    BOTTLES DRAWN AEROBIC AND ANAEROBIC Blood Culture results may not be optimal due to an inadequate volume of blood received in culture bottles   Culture   Final    NO GROWTH 5 DAYS Performed at [facility], [address] [place], [place] [address]    Report Status 12/02/2020 FINAL  Final  CULTURE, BLOOD (ROUTINE X 2) w Reflex to ID Panel     Status: None   Collection Time: 11/27/20  9:32 PM   Specimen: BLOOD LEFT HAND  Result Value Ref Range Status   Specimen Description  BLOOD LEFT HAND  Final   Special Requests    Final    BOTTLES DRAWN AEROBIC AND ANAEROBIC Blood Culture adequate volume   Culture   Final    NO GROWTH 5 DAYS Performed at [facility], [address] [place], [place] [address]    Report Status 12/02/2020 FINAL  Final  CULTURE, BLOOD (ROUTINE X 2) w Reflex to ID Panel     Status: None   Collection Time: 12/06/20 10:35 AM   Specimen: BLOOD  Result Value Ref Range Status   Specimen Description BLOOD LT KNUCKLE  Final   Special Requests   Final    BOTTLES DRAWN AEROBIC AND ANAEROBIC Blood Culture adequate volume   Culture   Final    NO GROWTH 5 DAYS Performed at [facility], [address] [place], [place] [address]    Report Status 12/11/2020 FINAL  Final  CULTURE, BLOOD (ROUTINE X 2) w Reflex to ID Panel     Status: None   Collection Time: 12/06/20 10:35 AM   Specimen: BLOOD  Result Value Ref Range Status   Specimen Description BLOOD BLOOD LEFT WRIST  Final   Special Requests AEROBIC BOTTLE ONLY Blood Culture adequate volume  Final   Culture   Final    NO GROWTH 5 DAYS Performed at [facility], [address], [place], [place] [address]    Report Status 12/11/2020 FINAL  Final  Culture, blood (single) w Reflex to ID Panel     Status: None   Collection Time: 12/14/20  2:36 PM   Specimen: BLOOD  Result Value Ref Range Status   Specimen Description BLOOD BLOOD RIGHT HAND  Final   Special Requests   Final    BOTTLES DRAWN AEROBIC AND ANAEROBIC Blood Culture adequate volume   Culture   Final    NO GROWTH 5 DAYS Performed at [facility], [address], [place], [place] [address]    Report Status 12/19/2020 FINAL  Final  Culture, blood (single) w Reflex to ID Panel     Status: None   Collection Time: 12/14/20  3:38 PM   Specimen: BLOOD  Result Value Ref Range Status   Specimen Description BLOOD A-LINE DRAW  Final   Special Requests   Final    BOTTLES DRAWN AEROBIC AND ANAEROBIC Blood Culture adequate volume   Culture   Final     NO GROWTH 5 DAYS Performed at [facility], [address] [place], [place] [address]    Report Status 12/19/2020 FINAL  Final  CULTURE, BLOOD (ROUTINE X 2) w Reflex to ID Panel     Status: None (Preliminary result)   Collection Time: 12/18/20 11:44 PM   Specimen: BLOOD LEFT WRIST  Result Value Ref Range Status   Specimen Description BLOOD LEFT WRIST  Final   Special Requests   Final    BOTTLES DRAWN AEROBIC AND ANAEROBIC Blood Culture adequate volume   Culture   Final    NO GROWTH 4 DAYS Performed at [facility], [address] [place], [place] [address]    Report Status PENDING  Incomplete  CULTURE, BLOOD (ROUTINE X 2) w Reflex to ID Panel     Status: None (Preliminary result)   Collection Time: 12/18/20 11:51 PM   Specimen: BLOOD LEFT HAND  Result Value Ref Range Status   Specimen Description BLOOD LEFT HAND  Final   Special Requests   Final    BOTTLES DRAWN AEROBIC AND ANAEROBIC Blood  Culture adequate volume   Culture   Final    NO GROWTH 4 DAYS Performed at [facility], [address] [place], [place] [address]    Report Status PENDING  Incomplete    Coagulation Studies: No results for input(s): LABPROT, INR in the last 72 hours.   Urinalysis: No results for input(s): COLORURINE, LABSPEC, PHURINE, GLUCOSEU, HGBUR, BILIRUBINUR, KETONESUR, PROTEINUR, UROBILINOGEN, NITRITE, LEUKOCYTESUR in the last 72 hours.  Invalid input(s): APPERANCEUR     Imaging: No results found.   Medications:       amLODipine  5 mg Oral Daily   Chlorhexidine Gluconate Cloth  6 each Topical Daily   feeding supplement  1 Container Oral TID BM   insulin aspart  0-15 Units Subcutaneous TID WC   insulin aspart  0-5 Units Subcutaneous QHS   levothyroxine  100 mcg Oral QAC breakfast   magnesium oxide  800 mg Oral BID   mouth rinse  15 mL Mouth Rinse BID   mirtazapine  15 mg Oral QHS   multivitamin with minerals  1 tablet Oral Daily   pantoprazole   40 mg Oral Daily   pneumococcal 13-valent conjugate vaccine  0.5 mL Intramuscular Tomorrow-1000   predniSONE  80 mg Oral Q breakfast   psyllium  1 packet Oral Daily   rosuvastatin  10 mg Oral QHS   sodium chloride flush  10-40 mL Intracatheter Q12H   thiamine  100 mg Oral Daily   valACYclovir  500 mg Oral Daily   acetaminophen **OR** acetaminophen, albuterol, haloperidol lactate, HYDROmorphone (DILAUDID) injection, loperamide, ondansetron **OR** ondansetron (ZOFRAN) IV, ondansetron (ZOFRAN) IV, oxyCODONE, polyethylene glycol, sodium chloride flush  Assessment/ Plan:  Tonya Myers is a 55 y.o. black female with diabetes mellitus type II, hypertension, hypothyroidism, CLL who was admitted to [facility] on 11/19/2020 for Altered mental status [R41.82] Encephalopathy acute [G93.40] Altered mental status, unspecified altered mental status type [R41.82] Sepsis with encephalopathy without septic shock, due to unspecified organism ([place]) [A41.9, R65.20, G93.40]  Acute Kidney Injury with hematuria and proteinuria with baseline creatinine 0.49 on 11/22/20.  IV contrast exposure on November 21, 2020.  Renal ultrasound negative for mass and obstruction.  Serologic studies: ANA+, ENA+, antichromatin +, anti- Ro_, P-ANCA + Completed 3 days of high dose IV steroids Rituximab on 8/17. Second dose in 4 weeks.  - Appreciate IR performing renal biopsy 12/16/20.  - Appreciate Vascular for placing Rt Permcath on 12/17/20 - Received dialysis Saturday, tolerated well - Outpatient dialysis arranged at [facility] clinic    Lab Results  Component Value Date   CREATININE 4.45 (H) 12/20/2020   CREATININE 3.88 (H) 12/19/2020   CREATININE 4.35 (H) 12/18/2020    Intake/Output Summary (Last 24 hours) at 12/22/2020 1228 Last data filed at 12/22/2020 1016 Gross per 24 hour  Intake 1210.25 ml  Output --  Net 1210.25 ml    2. Anemia  with history of CLL and thrombocytopenia Lab Results  Component Value  Date   HGB 9.5 (L) 12/22/2020   Bone marrow biopsy 12/04/20, limited marrow material with trilineage hematopoiesis. Recommending repeat bone marrow biopsy Appreciate hematology input.  -Will continue to monitor outpatient   3. Fever of unknown origin: not currently on antibiotics. Receiving blood products and rituximab. Most recent cultures are negative.   -afebrile for last 24 hours    LOS: [place] 8/29/202212:28 PM

## 2020-12-22 NOTE — Care Management Important Message (Signed)
Important Message  Patient Details  Name: Tonya Myers MRN: [id number] Date of Birth: [birth year]   Medicare Important Message Given:  Yes     Dannette Barbara 12/22/2020, 11:36 AM

## 2020-12-22 NOTE — TOC Transition Note (Signed)
Transition of Care [facility]) - [name] Discharge Note   Patient Details  Name: Tonya Myers MRN: [id number] Date of Birth: [birth year]  [facility] of [facility]) [name] Contact:  Candie Chroman, [name] Phone Number: 12/22/2020, 3:54 PM   Clinical Narrative:  Patient has orders to discharge home today. Per RN, hoyer lift has been delivered to the home. EMS transport has been arranged and she is 7th on the list. No further concerns. CSW signing off.  Final next level of care: [place] Barriers to Discharge: Barriers Resolved   Patient Goals and CMS Choice Patient states their goals for this hospitalization and ongoing recovery are:: Patient wants to be able to eat CMS Medicare.gov Compare Post Acute Care list provided to:: Patient Choice offered to / list presented to : Patient, Spouse  Discharge Placement                Patient to be transferred to facility by: EMS Name of family member notified: Gita Kudo Patient and family notified of of transfer: 12/22/20  Discharge Plan and Services   Discharge Planning Services: CM Consult Post Acute Care Choice: Home Health, [name]          DME Arranged: Hospital bed, Other see comment [facility] Lift) DME Agency: [employer] Date DME Agency Contacted: 12/22/20   Representative spoke with at DME Agency: Suanne Marker HH Arranged: RN, PT, OT, Nurse's [facility] Agency: [place] ([place]) Date HH Agency Contacted: 12/22/20 Time [place]: 1419 Representative spoke with at [place]: [place] ([place]) Interventions     Readmission Risk Interventions Readmission Risk Prevention Plan 11/24/2020  Transportation Screening Complete  PCP or Specialist Appt within 3-5 Days Complete  HRI or [place] Complete  Social Work Consult for [place] Planning/Counseling Complete  Palliative Care Screening Not Applicable  Medication Review  Press photographer) Complete  Some recent data might be hidden

## 2020-12-23 LAB — BPAM RBC
Blood Product Expiration Date: 202209082359
Blood Product Expiration Date: 202209082359
ISSUE DATE / TIME: 202208281531
ISSUE DATE / TIME: 202208282206
Unit Type and Rh: 5100
Unit Type and Rh: 5100

## 2020-12-23 LAB — TYPE AND SCREEN
ABO/RH(D): O POS
Antibody Screen: NEGATIVE
Unit division: 0
Unit division: 0

## 2020-12-23 LAB — CULTURE, BLOOD (ROUTINE X 2)
Culture: NO GROWTH
Culture: NO GROWTH
Special Requests: ADEQUATE
Special Requests: ADEQUATE

## 2020-12-24 LAB — FUNGUS CULTURE WITH STAIN

## 2020-12-24 LAB — FUNGAL ORGANISM REFLEX

## 2020-12-24 LAB — FUNGUS CULTURE RESULT

## 2020-12-26 ENCOUNTER — Inpatient Hospital Stay: Payer: Medicare Other | Attending: Oncology

## 2020-12-26 ENCOUNTER — Inpatient Hospital Stay: Payer: Medicare Other

## 2020-12-26 VITALS — BP 156/88 | HR 90 | Temp 98.4°F | Resp 18

## 2020-12-26 DIAGNOSIS — Z5111 Encounter for antineoplastic chemotherapy: Secondary | ICD-10-CM | POA: Insufficient documentation

## 2020-12-26 DIAGNOSIS — D8989 Other specified disorders involving the immune mechanism, not elsewhere classified: Secondary | ICD-10-CM

## 2020-12-26 DIAGNOSIS — C9110 Chronic lymphocytic leukemia of B-cell type not having achieved remission: Secondary | ICD-10-CM | POA: Diagnosis not present

## 2020-12-26 LAB — COMPREHENSIVE METABOLIC PANEL
ALT: 57 U/L — ABNORMAL HIGH (ref 0–44)
AST: 65 U/L — ABNORMAL HIGH (ref 15–41)
Albumin: 2.5 g/dL — ABNORMAL LOW (ref 3.5–5.0)
Alkaline Phosphatase: 177 U/L — ABNORMAL HIGH (ref 38–126)
Anion gap: 11 (ref 5–15)
BUN: 40 mg/dL — ABNORMAL HIGH (ref 6–20)
CO2: 22 mmol/L (ref 22–32)
Calcium: 7.5 mg/dL — ABNORMAL LOW (ref 8.9–10.3)
Chloride: 98 mmol/L (ref 98–111)
Creatinine, Ser: 3.79 mg/dL — ABNORMAL HIGH (ref 0.44–1.00)
GFR, Estimated: 13 mL/min — ABNORMAL LOW (ref >60)
Glucose, Bld: 351 mg/dL — ABNORMAL HIGH (ref 70–99)
Potassium: 3.5 mmol/L (ref 3.5–5.1)
Sodium: 131 mmol/L — ABNORMAL LOW (ref 135–145)
Total Bilirubin: 1.2 mg/dL (ref 0.3–1.2)
Total Protein: 6 g/dL — ABNORMAL LOW (ref 6.5–8.1)

## 2020-12-26 LAB — CBC WITH DIFFERENTIAL/PLATELET
Abs Immature Granulocytes: 0.14 10*3/uL — ABNORMAL HIGH (ref 0.00–0.07)
Basophils Absolute: 0 10*3/uL (ref 0.0–0.1)
Basophils Relative: 0 %
Eosinophils Absolute: 0 10*3/uL (ref 0.0–0.5)
Eosinophils Relative: 0 %
HCT: 33.2 % — ABNORMAL LOW (ref 36.0–46.0)
Hemoglobin: 11.5 g/dL — ABNORMAL LOW (ref 12.0–15.0)
Immature Granulocytes: 3 %
Lymphocytes Relative: 19 %
Lymphs Abs: 1 10*3/uL (ref 0.7–4.0)
MCH: 29.6 pg (ref 26.0–34.0)
MCHC: 34.6 g/dL (ref 30.0–36.0)
MCV: 85.3 fL (ref 80.0–100.0)
Monocytes Absolute: 0.1 10*3/uL (ref 0.1–1.0)
Monocytes Relative: 3 %
Neutro Abs: 4 10*3/uL (ref 1.7–7.7)
Neutrophils Relative %: 75 %
Platelets: 41 10*3/uL — ABNORMAL LOW (ref 150–400)
RBC: 3.89 MIL/uL (ref 3.87–5.11)
RDW: 16.5 % — ABNORMAL HIGH (ref 11.5–15.5)
WBC: 5.3 10*3/uL (ref 4.0–10.5)
nRBC: 0.6 % — ABNORMAL HIGH (ref 0.0–0.2)

## 2020-12-26 LAB — LACTATE DEHYDROGENASE: LDH: 400 U/L — ABNORMAL HIGH (ref 98–192)

## 2020-12-26 MED ORDER — DIPHENHYDRAMINE HCL 50 MG/ML IJ SOLN
50.0000 mg | Freq: Once | INTRAMUSCULAR | Status: AC
  Administered 2020-12-26: 50 mg via INTRAVENOUS
  Filled 2020-12-26: qty 1

## 2020-12-26 MED ORDER — FAMOTIDINE 20 MG IN NS 100 ML IVPB
20.0000 mg | Freq: Once | INTRAVENOUS | Status: AC
  Administered 2020-12-26: 20 mg via INTRAVENOUS
  Filled 2020-12-26: qty 20

## 2020-12-26 MED ORDER — ACETAMINOPHEN 325 MG PO TABS
650.0000 mg | ORAL_TABLET | Freq: Once | ORAL | Status: AC
  Administered 2020-12-26: 650 mg via ORAL
  Filled 2020-12-26: qty 2

## 2020-12-26 MED ORDER — METHYLPREDNISOLONE SODIUM SUCC 125 MG IJ SOLR
100.0000 mg | Freq: Once | INTRAMUSCULAR | Status: AC
  Administered 2020-12-26: 100 mg via INTRAVENOUS
  Filled 2020-12-26: qty 2

## 2020-12-26 MED ORDER — SODIUM CHLORIDE 0.9 % IV SOLN
1000.0000 mg | Freq: Once | INTRAVENOUS | Status: AC
  Administered 2020-12-26: 1000 mg via INTRAVENOUS
  Filled 2020-12-26: qty 100

## 2020-12-26 MED ORDER — SODIUM CHLORIDE 0.9 % IV SOLN
Freq: Once | INTRAVENOUS | Status: AC
  Filled 2020-12-26: qty 250

## 2020-12-26 NOTE — Patient Instructions (Signed)
[facility]   Discharge Instructions: Thank you for choosing [facility] to provide your oncology and hematology care.  If you have a lab appointment with the [facility], please go directly to the [facility] and check in at the registration area.  We strive to give you quality time with your provider. You may need to reschedule your appointment if you arrive late (15 or more minutes).  Arriving late affects you and other patients whose appointments are after yours.  Also, if you miss three or more appointments without notifying the office, you may be dismissed from the clinic at the provider's discretion.      For prescription refill requests, have your pharmacy contact our office and allow 72 hours for refills to be completed.    Today you received the following chemotherapy and/or immunotherapy agents: Rituximab-abbs (Truxima).      BELOW ARE SYMPTOMS THAT SHOULD BE REPORTED IMMEDIATELY: *FEVER GREATER THAN 100.4 F (38 C) OR HIGHER *CHILLS OR SWEATING *NAUSEA AND VOMITING THAT IS NOT CONTROLLED WITH YOUR NAUSEA MEDICATION *UNUSUAL SHORTNESS OF BREATH *UNUSUAL BRUISING OR BLEEDING *URINARY PROBLEMS (pain or burning when urinating, or frequent urination) *BOWEL PROBLEMS (unusual diarrhea, constipation, pain near the anus) TENDERNESS IN MOUTH AND THROAT WITH OR WITHOUT PRESENCE OF ULCERS (sore throat, sores in mouth, or a toothache) UNUSUAL RASH, SWELLING OR PAIN  UNUSUAL VAGINAL DISCHARGE OR ITCHING   Items with * indicate a potential emergency and should be followed up as soon as possible or go to the Emergency Department if any problems should occur.  Should you have questions after your visit or need to cancel or reschedule your appointment, please contact [facility]  [phone] and follow the prompts.  Office hours are 8:00 a.m. to 4:30 p.m. Monday - Friday. Please note that voicemails left  after 4:00 p.m. may not be returned until the following business day.  We are closed weekends and major holidays. You have access to a nurse at all times for urgent questions. Please call the main number to the clinic [phone] and follow the prompts.  For any non-urgent questions, you may also contact your provider using MyChart. We now offer e-Visits for anyone 2 and older to request care online for non-urgent symptoms. For details visit [email or web address].   Also download the MyChart app! Go to the app store, search "MyChart", open the app, select [facility], and log in with your MyChart username and password.  Due to Covid, a mask is required upon entering the hospital/clinic. If you do not have a mask, one will be given to you upon arrival. For doctor visits, patients may have 1 support person aged 62 or older with them. For treatment visits, patients cannot have anyone with them due to current Covid guidelines and our immunocompromised population.

## 2020-12-26 NOTE — Progress Notes (Signed)
1119- Patient tolerated first Truxima treatment on 12/12/2020 well and without any issues. MD, Dr. Grayland Ormond, notified and aware. MD also notified of today's lab results. Per MD order: okay to proceed with rapid Truxima infusion treatment today.

## 2021-01-17 NOTE — Progress Notes (Deleted)
Tonya Myers  Telephone:([phone]) [phone]  Fax:([phone]) [phone]     Tonya Myers DOB: [birth year]  MR#: [id number]  NOB#:[id number]  Patient Care Team: [name], MD as PCP - General (Family Medicine)  CHIEF COMPLAINT:  CLL  INTERVAL HISTORY: Patient returns to clinic today for routine yearly evaluation.  She continues to feel well and remains asymptomatic. She does not complain of weakness or fatigue. She denies any fevers, night sweats, or weight loss. She has no neurologic complaints.  She denies any chest pain, shortness of breath, cough, or hemoptysis.  She denies any nausea, vomiting, constipation, or diarrhea.  She has no urinary complaints.  Patient offers no specific complaints today.  REVIEW OF SYSTEMS:   Review of Systems  Constitutional: Negative.  Negative for fever, malaise/fatigue and weight loss.  Respiratory: Negative.  Negative for cough and shortness of breath.   Cardiovascular: Negative.  Negative for chest pain and leg swelling.  Gastrointestinal: Negative.  Negative for abdominal pain, constipation and nausea.  Genitourinary: Negative.  Negative for dysuria.  Musculoskeletal: Negative.  Negative for joint pain.  Skin: Negative.  Negative for rash.  Neurological: Negative.  Negative for dizziness, sensory change, weakness and headaches.  Psychiatric/Behavioral: Negative.  Negative for depression. The patient is not nervous/anxious.    As per HPI. Otherwise, a complete review of systems is negative.   PAST MEDICAL HISTORY: Past Medical History:  Diagnosis Date   Acute hemorrhoid 03/11/2015   Anxiety    Arthritis    Asthma    Cancer ([place])    Chronic back pain    CLL (chronic lymphocytic leukemia) ([place])    Depression    Diabetes mellitus without complication ([place])    Genital herpes    type 2   Hypertension    Hypothyroidism    Vertigo     PAST SURGICAL HISTORY: Past Surgical History:  Procedure Laterality Date   ABDOMINAL HYSTERECTOMY      BILATERAL SALPINGOOPHORECTOMY  2009   BREAST BIOPSY Right 05/17/2016   FIBROADENOMATOUS CHANGE AND SCLEROSING ADENOSIS WITH   COLONOSCOPY WITH PROPOFOL N/A 06/16/2015   Procedure: COLONOSCOPY WITH PROPOFOL;  Surgeon: [name], MD;  Location: [facility] ENDOSCOPY;  Service: Endoscopy;  Laterality: N/A;   DIALYSIS/PERMA CATHETER INSERTION N/A 12/17/2020   Procedure: DIALYSIS/PERMA CATHETER INSERTION;  Surgeon: Algernon Huxley, MD;  Location: [facility] CV LAB;  Service: Cardiovascular;  Laterality: N/A;   LAPAROSCOPIC SUPRACERVICAL HYSTERECTOMY  2009   due to [place] N/A 12/10/2020   Procedure: TEMPORARY DIALYSIS CATHETER;  Surgeon: Katha Cabal, MD;  Location: [facility] CV LAB;  Service: Cardiovascular;  Laterality: N/A;   THORACIC LAMINECTOMY FOR EPIDURAL ABSCESS Bilateral 06/17/2020   Procedure: THORACIC LAMINECTOMY FOR EPIDURAL ABSCESS;  Surgeon: Deetta Perla, MD;  Location: [facility] ORS;  Service: Neurosurgery;  Laterality: Bilateral;    FAMILY HISTORY Family History  Problem Relation Age of Onset   Cancer Paternal Aunt    Breast cancer Maternal Aunt 3    GYNECOLOGIC HISTORY:  No LMP recorded. Patient has had a hysterectomy.     ADVANCED DIRECTIVES:    HEALTH MAINTENANCE: Social History   Tobacco Use   Smoking status: Never   Smokeless tobacco: Never  Vaping Use   Vaping Use: Never used  Substance Use Topics   Alcohol use: Yes    Alcohol/week: 1.0 standard drink    Types: 1 Cans of beer per week   Drug use: No  Allergies  Allergen Reactions   Amoxapine    Penicillins     Current Outpatient Medications  Medication Sig Dispense Refill   acetaminophen (TYLENOL) 325 MG tablet Take 2 tablets (650 mg total) by mouth every 6 (six) hours as needed for mild pain (or Fever >/= 101).     albuterol (VENTOLIN HFA) 108 (90 Base) MCG/ACT inhaler Inhale into the lungs.     amLODipine (NORVASC) 5 MG tablet Take 1  tablet (5 mg total) by mouth daily. 30 tablet 0   aspirin EC 81 MG tablet Take 1 tablet (81 mg total) by mouth daily. Swallow whole. 30 tablet 11   Continuous Blood Gluc Sensor (DEXCOM G6 SENSOR) MISC SMARTSIG:1 Each Topical Every 10 Days     diclofenac Sodium (VOLTAREN) 1 % GEL Apply 2 g topically 4 (four) times daily. 2 g 0   fluticasone (FLONASE) 50 MCG/ACT nasal spray 1 spray 2 (two) times daily.     Insulin Glargine-Lixisenatide 100-33 UNT-MCG/ML SOPN Inject 50 Units into the skin every morning.     insulin lispro (HUMALOG) 100 UNIT/ML KwikPen CBG < 70: Take glucose/juice/candy as directed CBG 70 - 120: 0 units CBG 121 - 150: 2 units CBG 151 - 200: 3 units CBG 201 - 250: 5 units CBG 251 - 300: 8 units CBG 301 - 350: 11 units CBG 351 - 400: 15 units CBG > 400: 15 units and recheck in 1 hour - if still >400 repeat 15 units and call PCP 15 mL 11   Insulin Pen Needle (PEN NEEDLES) 32G X 4 MM MISC 1 each by Does not apply route 3 (three) times daily before meals. 200 each 0   levalbuterol (XOPENEX) 1.25 MG/3ML nebulizer solution Take 1.25 mg by nebulization every 6 (six) hours as needed.     levothyroxine (SYNTHROID) 150 MCG tablet Take 1 tablet (150 mcg total) by mouth daily before breakfast. 30 tablet 0   magnesium oxide (MAG-OX) 400 MG tablet Take 400 mg by mouth 2 (two) times daily.     mirtazapine (REMERON) 15 MG tablet Take 1 tablet (15 mg total) by mouth at bedtime. 30 tablet 0   montelukast (SINGULAIR) 10 MG tablet Take 1 tablet (10 mg total) by mouth at bedtime. 30 tablet 0   nystatin (MYCOSTATIN) 100000 UNIT/ML suspension Take 5 mLs by mouth 4 (four) times daily.     pantoprazole (PROTONIX) 40 MG tablet Take 1 tablet (40 mg total) by mouth daily. 30 tablet 0   rosuvastatin (CRESTOR) 10 MG tablet Take 1 tablet (10 mg total) by mouth at bedtime. 30 tablet 0   valACYclovir (VALTREX) 1000 MG tablet Take 1 tablet (1,000 mg total) by mouth 2 (two) times daily. 60 tablet 1   No current  facility-administered medications for this visit.    OBJECTIVE: There were no vitals taken for this visit.   There is no height or weight on file to calculate BMI.    ECOG FS:1 - Symptomatic but completely ambulatory  General: Well-developed, well-nourished, no acute distress. Eyes: Pink conjunctiva, anicteric sclera. HEENT: Normocephalic, moist mucous membranes. Lungs: No audible wheezing or coughing. Heart: Regular rate and rhythm. Abdomen: Soft, nontender, no obvious distention. Musculoskeletal: No edema, cyanosis, or clubbing. Neuro: Alert, answering all questions appropriately. Cranial nerves grossly intact. Skin: No rashes or petechiae noted. Psych: Normal affect. Lymphatics: No obvious lymphadenopathy.   LAB RESULTS:  No visits with results within 3 Day(s) from this visit.  Latest known visit with results is:  Appointment  on 12/26/2020  Component Date Value Ref Range Status   LDH 12/26/2020 400 (A) 98 - 192 U/L Final   Performed at [facility], [place], [place] [address]   Sodium 12/26/2020 131 (A) 135 - 145 mmol/L Final   Potassium 12/26/2020 3.5  3.5 - 5.1 mmol/L Final   Chloride 12/26/2020 98  98 - 111 mmol/L Final   CO2 12/26/2020 22  22 - 32 mmol/L Final   Glucose, Bld 12/26/2020 351 (A) 70 - 99 mg/dL Final   Glucose reference range applies only to samples taken after fasting for at least 8 hours.   BUN 12/26/2020 40 (A) 6 - 20 mg/dL Final   Creatinine, Ser 12/26/2020 3.79 (A) 0.44 - 1.00 mg/dL Final   Calcium 12/26/2020 7.5 (A) 8.9 - 10.3 mg/dL Final   Total Protein 12/26/2020 6.0 (A) 6.5 - 8.1 g/dL Final   Albumin 12/26/2020 2.5 (A) 3.5 - 5.0 g/dL Final   AST 12/26/2020 65 (A) 15 - 41 U/L Final   ALT 12/26/2020 57 (A) 0 - 44 U/L Final   Alkaline Phosphatase 12/26/2020 177 (A) 38 - 126 U/L Final   Total Bilirubin 12/26/2020 1.2  0.3 - 1.2 mg/dL Final   GFR, Estimated 12/26/2020 13 (A) >60 mL/min Final   Comment: (NOTE) Calculated using  the CKD-EPI Creatinine Equation (2021)    Anion gap 12/26/2020 11  5 - 15 Final   Performed at [facility], [place], [place] [address]   WBC 12/26/2020 5.3  4.0 - 10.5 K/uL Final   RBC 12/26/2020 3.89  3.87 - 5.11 MIL/uL Final   Hemoglobin 12/26/2020 11.5 (A) 12.0 - 15.0 g/dL Final   HCT 12/26/2020 33.2 (A) 36.0 - 46.0 % Final   MCV 12/26/2020 85.3  80.0 - 100.0 fL Final   MCH 12/26/2020 29.6  26.0 - 34.0 pg Final   MCHC 12/26/2020 34.6  30.0 - 36.0 g/dL Final   RDW 12/26/2020 16.5 (A) 11.5 - 15.5 % Final   Platelets 12/26/2020 41 (A) 150 - 400 K/uL Final   Comment: Immature Platelet Fraction may be clinically indicated, consider ordering this additional test [id number]    nRBC 12/26/2020 0.6 (A) 0.0 - 0.2 % Final   Neutrophils Relative % 12/26/2020 75  % Final   Neutro Abs 12/26/2020 4.0  1.7 - 7.7 K/uL Final   Lymphocytes Relative 12/26/2020 19  % Final   Lymphs Abs 12/26/2020 1.0  0.7 - 4.0 K/uL Final   Monocytes Relative 12/26/2020 3  % Final   Monocytes Absolute 12/26/2020 0.1  0.1 - 1.0 K/uL Final   Eosinophils Relative 12/26/2020 0  % Final   Eosinophils Absolute 12/26/2020 0.0  0.0 - 0.5 K/uL Final   Basophils Relative 12/26/2020 0  % Final   Basophils Absolute 12/26/2020 0.0  0.0 - 0.1 K/uL Final   Immature Granulocytes 12/26/2020 3  % Final   Abs Immature Granulocytes 12/26/2020 0.14 (A) 0.00 - 0.07 K/uL Final   Performed at [facility], [address] [place], [place] [address]    STUDIES: No results found.   ASSESSMENT: CLL  PLAN:   1. CLL:  Patient last received treatment with Rituxan and Treanda on April 09, 2015.  Her white blood cell continues to be within normal limits with a normal differential.  Her most recent imaging with CT scan on April 13, 2016 were reviewed independently with no evidence of disease. Patient does not require additional treatment at this  time. Patient has also requested no further CTs be done unless  there is consideration of reinitiation of treatment.  If patient needs retreatment, would likely use the same regimen, but patient would need to be heavily pretreated secondary to her previous reaction to Rituxan.  We also discussed the possibility of using Imbruvica if needed.  Return to clinic in 1 year for repeat laboratory work and routine evaluation.  2. Anxiety: Improved. Patient will require Ativan with any future CT scans. 3. Anemia: Resolved. Patient had a colonoscopy in February 2017 that was reported as normal.  I spent a total of 20 minutes reviewing chart data, face-to-face evaluation with the patient, counseling and coordination of care as detailed above.   Patient expressed understanding and was in agreement with this plan. She also understands that She can call clinic at any time with any questions, concerns, or complaints.   Lloyd Huger, MD   01/17/2021 7:06 PM

## 2021-01-19 ENCOUNTER — Ambulatory Visit: Payer: Medicare Other | Admitting: Oncology

## 2021-01-19 ENCOUNTER — Other Ambulatory Visit: Payer: Medicare Other

## 2021-01-20 ENCOUNTER — Inpatient Hospital Stay: Payer: Medicare Other

## 2021-01-20 ENCOUNTER — Inpatient Hospital Stay: Payer: Medicare Other | Admitting: Oncology

## 2021-01-20 DIAGNOSIS — C9110 Chronic lymphocytic leukemia of B-cell type not having achieved remission: Secondary | ICD-10-CM

## 2021-02-27 ENCOUNTER — Telehealth: Payer: Self-pay

## 2021-02-27 NOTE — Telephone Encounter (Signed)
Patient left vm stating she was told to call Dr Delaine Lame. I called her at [phone] and husband stated she has been admitted to [facility]. She had UTI while at [facility]. I gave him my number to give [facility] in case they need something from [place] and will await a call from patient or [facility], Husband also gave me his cell [phone]. I have also alerted Dr Delaine Lame that she has been admitted.

## 2021-04-16 ENCOUNTER — Encounter: Payer: Self-pay | Admitting: Oncology

## 2021-12-09 ENCOUNTER — Observation Stay (HOSPITAL_BASED_OUTPATIENT_CLINIC_OR_DEPARTMENT_OTHER)
Admission: EM | Admit: 2021-12-09 | Discharge: 2021-12-11 | Disposition: A | Discharge status: Home Health | Payer: Medicare Other | Source: Home / Self Care | Attending: Emergency Medicine | Admitting: Emergency Medicine

## 2021-12-09 ENCOUNTER — Observation Stay: Payer: Medicare Other

## 2021-12-09 ENCOUNTER — Other Ambulatory Visit: Payer: Self-pay

## 2021-12-09 ENCOUNTER — Emergency Department: Payer: Medicare Other

## 2021-12-09 DIAGNOSIS — G9341 Metabolic encephalopathy: Secondary | ICD-10-CM | POA: Insufficient documentation

## 2021-12-09 DIAGNOSIS — S2220XA Unspecified fracture of sternum, initial encounter for closed fracture: Secondary | ICD-10-CM | POA: Diagnosis not present

## 2021-12-09 DIAGNOSIS — I7782 Antineutrophilic cytoplasmic antibody (ANCA) vasculitis: Secondary | ICD-10-CM | POA: Insufficient documentation

## 2021-12-09 DIAGNOSIS — R55 Syncope and collapse: Secondary | ICD-10-CM | POA: Diagnosis present

## 2021-12-09 DIAGNOSIS — E1140 Type 2 diabetes mellitus with diabetic neuropathy, unspecified: Secondary | ICD-10-CM | POA: Insufficient documentation

## 2021-12-09 DIAGNOSIS — J45909 Unspecified asthma, uncomplicated: Secondary | ICD-10-CM | POA: Insufficient documentation

## 2021-12-09 DIAGNOSIS — G40909 Epilepsy, unspecified, not intractable, without status epilepticus: Secondary | ICD-10-CM | POA: Diagnosis not present

## 2021-12-09 DIAGNOSIS — I469 Cardiac arrest, cause unspecified: Secondary | ICD-10-CM

## 2021-12-09 DIAGNOSIS — I120 Hypertensive chronic kidney disease with stage 5 chronic kidney disease or end stage renal disease: Secondary | ICD-10-CM | POA: Diagnosis not present

## 2021-12-09 DIAGNOSIS — E039 Hypothyroidism, unspecified: Secondary | ICD-10-CM | POA: Diagnosis present

## 2021-12-09 DIAGNOSIS — R7989 Other specified abnormal findings of blood chemistry: Secondary | ICD-10-CM

## 2021-12-09 DIAGNOSIS — R778 Other specified abnormalities of plasma proteins: Secondary | ICD-10-CM | POA: Insufficient documentation

## 2021-12-09 DIAGNOSIS — Z856 Personal history of leukemia: Secondary | ICD-10-CM | POA: Insufficient documentation

## 2021-12-09 DIAGNOSIS — E876 Hypokalemia: Secondary | ICD-10-CM | POA: Insufficient documentation

## 2021-12-09 DIAGNOSIS — N186 End stage renal disease: Secondary | ICD-10-CM

## 2021-12-09 DIAGNOSIS — Z859 Personal history of malignant neoplasm, unspecified: Secondary | ICD-10-CM | POA: Insufficient documentation

## 2021-12-09 DIAGNOSIS — Z992 Dependence on renal dialysis: Secondary | ICD-10-CM | POA: Diagnosis not present

## 2021-12-09 DIAGNOSIS — E440 Moderate protein-calorie malnutrition: Secondary | ICD-10-CM | POA: Diagnosis not present

## 2021-12-09 DIAGNOSIS — Z20822 Contact with and (suspected) exposure to covid-19: Secondary | ICD-10-CM | POA: Diagnosis not present

## 2021-12-09 DIAGNOSIS — L89153 Pressure ulcer of sacral region, stage 3: Secondary | ICD-10-CM | POA: Insufficient documentation

## 2021-12-09 DIAGNOSIS — E1122 Type 2 diabetes mellitus with diabetic chronic kidney disease: Secondary | ICD-10-CM | POA: Insufficient documentation

## 2021-12-09 DIAGNOSIS — E119 Type 2 diabetes mellitus without complications: Secondary | ICD-10-CM | POA: Diagnosis not present

## 2021-12-09 DIAGNOSIS — D631 Anemia in chronic kidney disease: Secondary | ICD-10-CM | POA: Diagnosis not present

## 2021-12-09 DIAGNOSIS — R531 Weakness: Secondary | ICD-10-CM | POA: Diagnosis present

## 2021-12-09 DIAGNOSIS — Z79899 Other long term (current) drug therapy: Secondary | ICD-10-CM | POA: Insufficient documentation

## 2021-12-09 DIAGNOSIS — E785 Hyperlipidemia, unspecified: Secondary | ICD-10-CM

## 2021-12-09 DIAGNOSIS — Z794 Long term (current) use of insulin: Secondary | ICD-10-CM | POA: Insufficient documentation

## 2021-12-09 DIAGNOSIS — K5641 Fecal impaction: Secondary | ICD-10-CM | POA: Diagnosis not present

## 2021-12-09 DIAGNOSIS — N390 Urinary tract infection, site not specified: Secondary | ICD-10-CM | POA: Diagnosis not present

## 2021-12-09 DIAGNOSIS — E1142 Type 2 diabetes mellitus with diabetic polyneuropathy: Secondary | ICD-10-CM | POA: Diagnosis not present

## 2021-12-09 DIAGNOSIS — I248 Other forms of acute ischemic heart disease: Secondary | ICD-10-CM | POA: Diagnosis not present

## 2021-12-09 DIAGNOSIS — C9111 Chronic lymphocytic leukemia of B-cell type in remission: Secondary | ICD-10-CM | POA: Diagnosis not present

## 2021-12-09 DIAGNOSIS — L8990 Pressure ulcer of unspecified site, unspecified stage: Secondary | ICD-10-CM | POA: Insufficient documentation

## 2021-12-09 DIAGNOSIS — L89154 Pressure ulcer of sacral region, stage 4: Secondary | ICD-10-CM | POA: Diagnosis not present

## 2021-12-09 DIAGNOSIS — F32A Depression, unspecified: Secondary | ICD-10-CM | POA: Diagnosis not present

## 2021-12-09 DIAGNOSIS — Z7982 Long term (current) use of aspirin: Secondary | ICD-10-CM | POA: Insufficient documentation

## 2021-12-09 DIAGNOSIS — I700 Atherosclerosis of aorta: Secondary | ICD-10-CM | POA: Diagnosis not present

## 2021-12-09 DIAGNOSIS — K219 Gastro-esophageal reflux disease without esophagitis: Secondary | ICD-10-CM

## 2021-12-09 DIAGNOSIS — R768 Other specified abnormal immunological findings in serum: Secondary | ICD-10-CM

## 2021-12-09 DIAGNOSIS — K861 Other chronic pancreatitis: Secondary | ICD-10-CM | POA: Diagnosis not present

## 2021-12-09 DIAGNOSIS — N2581 Secondary hyperparathyroidism of renal origin: Secondary | ICD-10-CM | POA: Diagnosis not present

## 2021-12-09 LAB — CBC WITH DIFFERENTIAL/PLATELET
Abs Immature Granulocytes: 0.06 10*3/uL (ref 0.00–0.07)
Basophils Absolute: 0 10*3/uL (ref 0.0–0.1)
Basophils Relative: 1 %
Eosinophils Absolute: 0.1 10*3/uL (ref 0.0–0.5)
Eosinophils Relative: 1 %
HCT: 28 % — ABNORMAL LOW (ref 36.0–46.0)
Hemoglobin: 8.7 g/dL — ABNORMAL LOW (ref 12.0–15.0)
Immature Granulocytes: 1 %
Lymphocytes Relative: 57 %
Lymphs Abs: 2.6 10*3/uL (ref 0.7–4.0)
MCH: 29.4 pg (ref 26.0–34.0)
MCHC: 31.1 g/dL (ref 30.0–36.0)
MCV: 94.6 fL (ref 80.0–100.0)
Monocytes Absolute: 0.5 10*3/uL (ref 0.1–1.0)
Monocytes Relative: 11 %
Neutro Abs: 1.3 10*3/uL — ABNORMAL LOW (ref 1.7–7.7)
Neutrophils Relative %: 29 %
Platelets: 184 10*3/uL (ref 150–400)
RBC: 2.96 MIL/uL — ABNORMAL LOW (ref 3.87–5.11)
RDW: 17.4 % — ABNORMAL HIGH (ref 11.5–15.5)
WBC: 4.4 10*3/uL (ref 4.0–10.5)
nRBC: 0 % (ref 0.0–0.2)

## 2021-12-09 LAB — BASIC METABOLIC PANEL
Anion gap: 11 (ref 5–15)
BUN: 16 mg/dL (ref 6–20)
CO2: 25 mmol/L (ref 22–32)
Calcium: 8 mg/dL — ABNORMAL LOW (ref 8.9–10.3)
Chloride: 101 mmol/L (ref 98–111)
Creatinine, Ser: 2.94 mg/dL — ABNORMAL HIGH (ref 0.44–1.00)
GFR, Estimated: 18 mL/min — ABNORMAL LOW (ref >60)
Glucose, Bld: 121 mg/dL — ABNORMAL HIGH (ref 70–99)
Potassium: 3.3 mmol/L — ABNORMAL LOW (ref 3.5–5.1)
Sodium: 137 mmol/L (ref 135–145)

## 2021-12-09 LAB — TROPONIN I (HIGH SENSITIVITY)
Troponin I (High Sensitivity): 20 ng/L — ABNORMAL HIGH (ref <18)
Troponin I (High Sensitivity): 302 ng/L (ref <18)

## 2021-12-09 MED ORDER — ONDANSETRON HCL 4 MG/2ML IJ SOLN: 4.0000 mg | Freq: Four times a day (QID) | INTRAMUSCULAR | Status: DC | PRN

## 2021-12-09 MED ORDER — SODIUM CHLORIDE 0.9 % IV SOLN: INTRAVENOUS | Status: DC

## 2021-12-09 MED ORDER — LEVOTHYROXINE SODIUM 50 MCG PO TABS
150.0000 ug | ORAL_TABLET | Freq: Every day | ORAL | Status: DC
  Administered 2021-12-10: 150 ug via ORAL
  Filled 2021-12-09: qty 3

## 2021-12-09 MED ORDER — FLUTICASONE PROPIONATE 50 MCG/ACT NA SUSP: 1.0000 | Freq: Every day | NASAL | Status: DC | PRN

## 2021-12-09 MED ORDER — ROSUVASTATIN CALCIUM 10 MG PO TABS
10.0000 mg | ORAL_TABLET | Freq: Every day | ORAL | Status: DC
  Administered 2021-12-10: 10 mg via ORAL
  Filled 2021-12-09 (×2): qty 1

## 2021-12-09 MED ORDER — INSULIN GLARGINE-YFGN 100 UNIT/ML ~~LOC~~ SOLN
50.0000 [IU] | Freq: Every day | SUBCUTANEOUS | Status: DC
  Filled 2021-12-09: qty 0.5

## 2021-12-09 MED ORDER — AMLODIPINE BESYLATE 5 MG PO TABS
5.0000 mg | ORAL_TABLET | Freq: Every day | ORAL | Status: DC
  Administered 2021-12-09 – 2021-12-10 (×2): 5 mg via ORAL
  Filled 2021-12-09 (×2): qty 1

## 2021-12-09 MED ORDER — ACETAMINOPHEN 325 MG PO TABS
650.0000 mg | ORAL_TABLET | Freq: Four times a day (QID) | ORAL | Status: DC | PRN
  Filled 2021-12-09: qty 2

## 2021-12-09 MED ORDER — MIRTAZAPINE 15 MG PO TABS
15.0000 mg | ORAL_TABLET | Freq: Every day | ORAL | Status: DC
  Filled 2021-12-09: qty 1

## 2021-12-09 MED ORDER — ONDANSETRON HCL 4 MG PO TABS: 4.0000 mg | ORAL_TABLET | Freq: Four times a day (QID) | ORAL | Status: DC | PRN

## 2021-12-09 MED ORDER — MAGNESIUM HYDROXIDE 400 MG/5ML PO SUSP: 30.0000 mL | Freq: Every day | ORAL | Status: DC | PRN

## 2021-12-09 MED ORDER — ENOXAPARIN SODIUM 40 MG/0.4ML IJ SOSY: 40.0000 mg | PREFILLED_SYRINGE | INTRAMUSCULAR | Status: DC

## 2021-12-09 MED ORDER — PANTOPRAZOLE SODIUM 40 MG PO TBEC
40.0000 mg | DELAYED_RELEASE_TABLET | Freq: Every day | ORAL | Status: DC
  Administered 2021-12-10 – 2021-12-11 (×2): 40 mg via ORAL
  Filled 2021-12-09 (×2): qty 1

## 2021-12-09 MED ORDER — MONTELUKAST SODIUM 10 MG PO TABS
10.0000 mg | ORAL_TABLET | Freq: Every day | ORAL | Status: DC
  Filled 2021-12-09 (×2): qty 1

## 2021-12-09 MED ORDER — ACETAMINOPHEN 650 MG RE SUPP: 650.0000 mg | Freq: Four times a day (QID) | RECTAL | Status: DC | PRN

## 2021-12-09 MED ORDER — INSULIN GLARGINE-LIXISENATIDE 100-33 UNT-MCG/ML ~~LOC~~ SOPN
50.0000 [IU] | PEN_INJECTOR | SUBCUTANEOUS | Status: DC
Start: 2021-12-10 — End: 2021-12-09

## 2021-12-09 MED ORDER — ASPIRIN 81 MG PO TBEC
81.0000 mg | DELAYED_RELEASE_TABLET | Freq: Every day | ORAL | Status: DC
  Filled 2021-12-09: qty 1

## 2021-12-09 MED ORDER — ALBUTEROL SULFATE (2.5 MG/3ML) 0.083% IN NEBU: 2.5000 mg | INHALATION_SOLUTION | RESPIRATORY_TRACT | Status: DC | PRN

## 2021-12-09 MED ORDER — TRAZODONE HCL 50 MG PO TABS: 25.0000 mg | ORAL_TABLET | Freq: Every evening | ORAL | Status: DC | PRN

## 2021-12-09 NOTE — Assessment & Plan Note (Signed)
-   We will continue PPI therapy 

## 2021-12-09 NOTE — ED Notes (Signed)
Pt had a BM and was changed.

## 2021-12-09 NOTE — Assessment & Plan Note (Signed)
-   We will replace potassium and follow magnesium level.

## 2021-12-09 NOTE — ED Provider Notes (Signed)
[facility] Provider Note    Event Date/Time   First MD Initiated Contact with Patient 12/09/21 1549     (approximate)   History   Chief Complaint: Post CPR   HPI  Tonya Myers is a 56 y.o. female with a history of ESRD on hemodialysis, ANCA vasculitis, diabetes, hypertension, seizure disorder, chronic pain, CLL in remission who was recently admitted at [facility] from 12/04/2021 to 12/08/2021 due to altered mental status who is brought to the ED today from dialysis due to loss of consciousness.  Patient was receiving her treatment and noted that she was feeling lightheaded, and then passed out.  Per EMS, they were notified by facility staff that they had performed 2 minutes of CPR prior to EMS arrival.  When they arrived, patient was alert and oriented and conversant.  Currently patient complains of pain at her sacral decubitus wound as well as a generalized headache which has been present for the last several days ever since having a lumbar puncture.  Reviewed outside records from CSF studies performed at [facility], noting routine CSF labs were normal, no leukocytosis, negative for pathogens.  Blood cultures negative.  [facility] discharge summary: Brief History of Present Illness:  Tonya Myers is 56 y.o. 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  _____________________  Hospital Course by Problem: # Acute  metabolic encephalopathy, possibly due to polypharmacy  # History of spinal meningitis with epidural abscess 2022 Challenging history as patient unable to offer any input and has received most of her complex care thru [facility] system. Had recent w/u including CT brain, EEG and MRI. Partner notes that this type of presentation has often accompanied infections and seems to improve with abx treatment. Uremia seems like less likely cause. Has had a series of similar admissions earlier this year. At this point, primary sources of possible infection include CNS or possible catheter associated BSI, less likely UTI, PNA. Had negative BCx 8/2 at [facility]. Discussed with ID on call this evening given MDR organisms, allergy profile, severity of encephalopathy and complex infectious history. Noninfectious processes also possible -- WBC not elevated, no recent fevers, HD stable. Medications/polypharmacy may also be contributing: has allergy to ceftazidime (hallucinations) but recently received this medication; also taking gabapentin + pregabalin for unclear reasons and recently stopped all opioids.  Neurology consulted and doesn't feel her AMS is due to neurological process.  She was seen by ID with extensive ID workup done and unremarkable. Repeated MRI brain and lumbar per ID no new change other than chronic microvascular disease. 8/14 LP done by neuroIR, CSF tests unremarkable Negative blood and urine cultures. CSF is negative for gram stain, crypto, meningitis panel  She was started on iv vancomycin, on cefepime and ampicillin, acyclovir for broad spectrum coverage until 8/15 with negative ID workup  Her mental status improved with holding ativan, clonopin, olanzapine, pregabalin and gabapentin and was back to normal after the HD 8/14 (attempted 8/11 but stopped after 40 min due to hypotension without any fluid removal).  D/w ID today,  ID agreed with discharge home.  Restart lower dose of pregabalin only daily      Physical Exam   Triage Vital Signs: ED Triage Vitals  Enc Vitals Group     BP 12/09/21 1555 122/73     Pulse Rate 12/09/21 1546 61     Resp 12/09/21 1546 18     Temp 12/09/21 1546 98 F (36.7 C)     Temp src --      SpO2 12/09/21 1546 95 %     Weight --      Height --      Head Circumference --      Peak Flow --      Pain Score 12/09/21 1545 0     Pain Loc --      Pain Edu? --      Excl. in [place]? --     Most recent vital signs: Vitals:   12/09/21 1630 12/09/21 1700  BP: 124/75 139/74  Pulse: 65 66  Resp: 12 16  Temp:    SpO2: 97% 97%    General: Awake, no distress.  CV:  Good peripheral perfusion.  Regular rate and rhythm.  Tunneled right IJ dialysis catheter present on right upper chest, noninflamed skin site. Resp:  Normal effort.  Clear to auscultation bilaterally Abd:  No distention.  Soft and nontender Other:  Stage III sacral decub wound measuring about 1 x 3 cm, good granulation, appears clean, packing material remains in the wound without any exudate or discharge.  No inflammatory changes.   ED Results / Procedures / Treatments   Labs (all labs ordered are listed, but only abnormal results are displayed) Labs Reviewed  BASIC METABOLIC PANEL - Abnormal; Notable for the following components:      Result Value   Potassium 3.3 (*)    Glucose, Bld 121 (*)    Creatinine, Ser 2.94 (*)    Calcium 8.0 (*)    GFR, Estimated 18 (*)    All other components within normal limits  CBC WITH DIFFERENTIAL/PLATELET - Abnormal; Notable for the following components:   RBC 2.96 (*)    Hemoglobin 8.7 (*)    HCT 28.0 (*)    RDW 17.4 (*)    Neutro Abs 1.3 (*)    All other components within normal limits  TROPONIN I (HIGH SENSITIVITY) - Abnormal; Notable for the following components:   Troponin I (High Sensitivity) 20 (*)    All other components within normal limits  HIV ANTIBODY (ROUTINE TESTING W REFLEX)  BASIC METABOLIC PANEL  CBC  TROPONIN I (HIGH SENSITIVITY)      EKG Interpreted by me Sinus rhythm rate of 59, left axis, slightly prolonged QTc of 512 ms.  Normal QRS ST segments and T waves.  No ischemic changes.  No evidence of underlying arrhythmia.   RADIOLOGY CT head interpreted by me, shows no mass or intracranial hemorrhage or cerebral edema.  Radiology report reviewed   PROCEDURES:  Procedures   MEDICATIONS ORDERED IN ED: Medications  aspirin EC tablet 81 mg (has no administration in time range)  amLODipine (NORVASC) tablet 5 mg (has no administration in time range)  rosuvastatin (CRESTOR) tablet 10 mg (has no administration in time range)  mirtazapine (REMERON) tablet 15 mg (has no administration in time range)  Insulin Glargine-Lixisenatide 100-33 UNT-MCG/ML SOPN 50 Units (has no administration in time range)  levothyroxine (SYNTHROID) tablet 150 mcg (has no administration in time range)  pantoprazole (PROTONIX) EC tablet 40 mg (has no  administration in time range)  fluticasone (FLONASE) 50 MCG/ACT nasal spray 1 spray (has no administration in time range)  albuterol (PROVENTIL) (5 MG/ML) 0.5% nebulizer solution 2.5 mg (has no administration in time range)  montelukast (SINGULAIR) tablet 10 mg (has no administration in time range)  enoxaparin (LOVENOX) injection 40 mg (has no administration in time range)  0.9 %  sodium chloride infusion (has no administration in time range)  acetaminophen (TYLENOL) tablet 650 mg (has no administration in time range)    Or  acetaminophen (TYLENOL) suppository 650 mg (has no administration in time range)  traZODone (DESYREL) tablet 25 mg (has no administration in time range)  magnesium hydroxide (MILK OF MAGNESIA) suspension 30 mL (has no administration in time range)  ondansetron (ZOFRAN) tablet 4 mg (has no administration in time range)    Or  ondansetron (ZOFRAN) injection 4 mg (has no administration in time range)     IMPRESSION / MDM / ASSESSMENT AND PLAN / ED COURSE  I reviewed the  triage vital signs and the nursing notes.                              Differential diagnosis includes, but is not limited to, intracranial hemorrhage, cerebral edema, electrolyte abnormality, anemia, non-STEMI  Patient's presentation is most consistent with acute presentation with potential threat to life or bodily function.  Patient presents with episode of lightheadedness and syncope during dialysis.  Currently back to baseline without any acute symptoms other than post-LP headache.  I do not see any signs of infection.  CT head unremarkable.  We will follow-up labs.  ----------------------------------------- 6:10 PM on 12/09/2021 ----------------------------------------- Labs unremarkable except for slightly elevated troponin of 20.  This is nonspecific.  Case discussed with hospitalist for further evaluation due to syncope and CPR administration earlier today.  No evidence of any ongoing arrhythmia thus far here in the ED.       FINAL CLINICAL IMPRESSION(S) / ED DIAGNOSES   Final diagnoses:  Syncope, unspecified syncope type  ESRD on hemodialysis ([place])  Decubitus ulcer of sacral region, stage 3 ([place])  Type 2 diabetes mellitus without complication, with long-term current use of insulin ([facility])  P-ANCA titer positive     Rx / DC Orders   ED Discharge Orders     None        Note:  This document was prepared using Dragon voice recognition software and may include unintentional dictation errors.   Carrie Mew, MD 12/09/21 1810

## 2021-12-09 NOTE — H&P (Signed)
[place]   PATIENT NAME: Tonya Myers    MR#:  [id number]  DATE OF BIRTH:  1966-03-24  DATE OF ADMISSION:  12/09/2021  PRIMARY CARE PHYSICIAN: [name], MD   Patient is coming from: Home  REQUESTING/REFERRING PHYSICIAN: Brenton Grills, MD  CHIEF COMPLAINT:   Chief Complaint  Patient presents with   Post CPR    HISTORY OF PRESENT ILLNESS:  Kala Ambriz is a 56 y.o. African American female with medical history significant for ESRD on HD, anxiety, asthma, CLL, depression, type 2 diabetes mellitus, hypertension and hypothyroidism, who presented to the ER with acute onset of syncope while at hemodialysis today.  The patient apparently had half of his HD session.  He was thought to have cardiac arrest and had 2 minutes of CPR however by arrival of EMS he was conversant at his baseline.  No chest pain or palpitations though she had soreness after CPR.  No nausea or vomiting or abdominal pain.  No cough or wheezing or dyspnea.  ED Course: Upon presentation to the emergency room, vital signs were within normal.   Labs revealed hypokalemia of 3.3 with a creatinine of 2.94 and calcium 8.  High sensitive troponin I was 20 and CBC showed anemia with hemoglobin of 8.7 and hematocrit of 28 compared to 11.5 and 33.2 on 12/26/2020. EKG as reviewed by me : EKG showed normal sinus rhythm with a rate of 59 with left anterior fascicular block and low voltage QRS and prolonged QT interval with QTc of 512 MS. Imaging: Noncontrasted head CT scan revealed no acute ventricular abnormalities.  The patient will be admitted to an observation medical telemetry bed for further evaluation and management. PAST MEDICAL HISTORY:   Past Medical History:  Diagnosis Date   Acute hemorrhoid 03/11/2015   Anxiety    Arthritis    Asthma    Cancer ([place])    Chronic back pain    CLL (chronic lymphocytic leukemia) ([place])    Depression    Diabetes mellitus without complication ([place])    Genital  herpes    type 2   Hypertension    Hypothyroidism    Vertigo     PAST SURGICAL HISTORY:   Past Surgical History:  Procedure Laterality Date   ABDOMINAL HYSTERECTOMY     BILATERAL SALPINGOOPHORECTOMY  2009   BREAST BIOPSY Right 05/17/2016   FIBROADENOMATOUS CHANGE AND SCLEROSING ADENOSIS WITH   COLONOSCOPY WITH PROPOFOL N/A 06/16/2015   Procedure: COLONOSCOPY WITH PROPOFOL;  Surgeon: [name], MD;  Location: [facility] ENDOSCOPY;  Service: Endoscopy;  Laterality: N/A;   DIALYSIS/PERMA CATHETER INSERTION N/A 12/17/2020   Procedure: DIALYSIS/PERMA CATHETER INSERTION;  Surgeon: Algernon Huxley, MD;  Location: [place] CV LAB;  Service: Cardiovascular;  Laterality: N/A;   LAPAROSCOPIC SUPRACERVICAL HYSTERECTOMY  2009   due to [place] N/A 12/10/2020   Procedure: TEMPORARY DIALYSIS CATHETER;  Surgeon: Katha Cabal, MD;  Location: [place] [facility];  Service: Cardiovascular;  Laterality: N/A;   THORACIC LAMINECTOMY FOR EPIDURAL ABSCESS Bilateral 06/17/2020   Procedure: THORACIC LAMINECTOMY FOR EPIDURAL ABSCESS;  Surgeon: Deetta Perla, MD;  Location: [facility] ORS;  Service: Neurosurgery;  Laterality: Bilateral;    SOCIAL HISTORY:   Social History   Tobacco Use   Smoking status: Never   Smokeless tobacco: Never  Substance Use Topics   Alcohol use: Yes    Alcohol/week: 1.0 standard drink of alcohol    Types:  1 Cans of beer per week    FAMILY HISTORY:   Family History  Problem Relation Age of Onset   Cancer Paternal Aunt    Breast cancer Maternal Aunt 28    DRUG ALLERGIES:   Allergies  Allergen Reactions   Amoxapine    Penicillins     REVIEW OF SYSTEMS:   ROS As per history of present illness. All pertinent systems were reviewed above. Constitutional, HEENT, cardiovascular, respiratory, GI, GU, musculoskeletal, neuro, psychiatric, endocrine, integumentary and hematologic systems were reviewed and are otherwise  negative/unremarkable except for positive findings mentioned above in the HPI.   MEDICATIONS AT HOME:   Prior to Admission medications   Medication Sig Start Date End Date Taking? Authorizing Provider  acetaminophen (TYLENOL) 325 MG tablet Take 2 tablets (650 mg total) by mouth every 6 (six) hours as needed for mild pain (or Fever >/= 101). 07/30/20   Angiulli, Lavon Paganini, PA-C  amLODipine (NORVASC) 5 MG tablet Take 1 tablet (5 mg total) by mouth daily. 12/22/20   Little Ishikawa, MD  aspirin EC 81 MG tablet Take 1 tablet (81 mg total) by mouth daily. Swallow whole. 10/02/20   Max Sane, MD  Continuous Blood Gluc Sensor (DEXCOM G6 SENSOR) MISC SMARTSIG:1 Each Topical Every 10 Days 08/12/20   [provider]  diclofenac Sodium (VOLTAREN) 1 % GEL Apply 2 g topically 4 (four) times daily. 07/30/20   Angiulli, Lavon Paganini, PA-C  fluticasone (FLONASE) 50 MCG/ACT nasal spray 1 spray 2 (two) times daily.    [provider]  Insulin Glargine-Lixisenatide 100-33 UNT-MCG/ML SOPN Inject 50 Units into the skin every morning. 02/19/20   [provider]  insulin lispro (HUMALOG) 100 UNIT/ML KwikPen CBG < 70: Take glucose/juice/candy as directed CBG 70 - 120: 0 units CBG 121 - 150: 2 units CBG 151 - 200: 3 units CBG 201 - 250: 5 units CBG 251 - 300: 8 units CBG 301 - 350: 11 units CBG 351 - 400: 15 units CBG > 400: 15 units and recheck in 1 hour - if still >400 repeat 15 units and call PCP 12/22/20   Little Ishikawa, MD  levalbuterol Penne Lash) 1.25 MG/3ML nebulizer solution Take 1.25 mg by nebulization every 6 (six) hours as needed. 03/07/20   [provider]  levothyroxine (SYNTHROID) 150 MCG tablet Take 1 tablet (150 mcg total) by mouth daily before breakfast. 07/30/20   Angiulli, Lavon Paganini, PA-C  magnesium oxide (MAG-OX) 400 MG tablet Take 400 mg by mouth 2 (two) times daily. 07/30/20   [provider]  mirtazapine (REMERON) 15 MG tablet Take 1 tablet (15 mg total)  by mouth at bedtime. 12/22/20   Little Ishikawa, MD  montelukast (SINGULAIR) 10 MG tablet Take 1 tablet (10 mg total) by mouth at bedtime. 07/30/20   Angiulli, Lavon Paganini, PA-C  nystatin (MYCOSTATIN) 100000 UNIT/ML suspension Take 5 mLs by mouth 4 (four) times daily. 11/06/20   [provider]  pantoprazole (PROTONIX) 40 MG tablet Take 1 tablet (40 mg total) by mouth daily. 07/30/20   Angiulli, Lavon Paganini, PA-C  rosuvastatin (CRESTOR) 10 MG tablet Take 1 tablet (10 mg total) by mouth at bedtime. 07/30/20   Angiulli, Lavon Paganini, PA-C  valACYclovir (VALTREX) 1000 MG tablet Take 1 tablet (1,000 mg total) by mouth 2 (two) times daily. 07/30/20   Angiulli, Lavon Paganini, PA-C  budesonide-formoterol (SYMBICORT) 160-4.5 MCG/ACT inhaler Inhale into the lungs. 07/14/18 03/15/19  [provider]      VITAL  SIGNS:  Blood pressure (!) 140/83, pulse 72, temperature 98 F (36.7 C), resp. rate 10, SpO2 100 %.  PHYSICAL EXAMINATION:  Physical Exam  GENERAL:  56 y.o.-year-old African-American female patient lying in the bed with no acute distress.  EYES: Pupils equal, round, reactive to light and accommodation. No scleral icterus. Extraocular muscles intact.  HEENT: Head atraumatic, normocephalic. Oropharynx and nasopharynx clear.  NECK:  Supple, no jugular venous distention. No thyroid enlargement, no tenderness.  LUNGS: Normal breath sounds bilaterally, no wheezing, rales,rhonchi or crepitation. No use of accessory muscles of respiration.  CARDIOVASCULAR: Regular rate and rhythm, S1, S2 normal. No murmurs, rubs, or gallops.  ABDOMEN: Soft, nondistended, nontender. Bowel sounds present. No organomegaly or mass.  EXTREMITIES: No pedal edema, cyanosis, or clubbing.  NEUROLOGIC: Cranial nerves II through XII are intact. Muscle strength 5/5 in all extremities. Sensation intact. Gait not checked.  PSYCHIATRIC: The patient is alert and oriented x 3.  Normal affect and good eye contact. SKIN: No obvious rash,  lesion, or ulcer.   LABORATORY PANEL:   CBC Recent Labs  Lab 12/09/21 1548  WBC 4.4  HGB 8.7*  HCT 28.0*  PLT 184   ------------------------------------------------------------------------------------------------------------------  Chemistries  Recent Labs  Lab 12/09/21 1548  NA 137  K 3.3*  CL 101  CO2 25  GLUCOSE 121*  BUN 16  CREATININE 2.94*  CALCIUM 8.0*   ------------------------------------------------------------------------------------------------------------------  Cardiac Enzymes No results for input(s): "TROPONINI" in the last 168 hours. ------------------------------------------------------------------------------------------------------------------  RADIOLOGY:  CT Head Wo Contrast  Result Date: 12/09/2021 CLINICAL DATA:  Syncope/presyncope, cerebrovascular cause suspected EXAM: CT HEAD WITHOUT CONTRAST TECHNIQUE: Contiguous axial images were obtained from the base of the skull through the vertex without intravenous contrast. RADIATION DOSE REDUCTION: This exam was performed according to the departmental dose-optimization program which includes automated exposure control, adjustment of the mA and/or kV according to patient size and/or use of iterative reconstruction technique. COMPARISON:  July 2022 FINDINGS: Brain: There is no acute intracranial hemorrhage, mass effect, or edema. Gray-white differentiation is preserved. There is no extra-axial fluid collection. Ventricles and sulci are stable in size and configuration. Vascular: There is mild atherosclerotic calcification at the skull base. Skull: Calvarium is unremarkable. Sinuses/Orbits: No acute finding. Other: None. IMPRESSION: No acute intracranial abnormality. Electronically Signed   By: Macy Mis M.D.   On: 12/09/2021 16:40      IMPRESSION AND PLAN:  Assessment and Plan: * Syncope - The patient be admitted to the medical observation bed. - The patient was thought to have cardiac arrest without  respiratory arrest and had 2 minutes of CPR. - We will monitor for arrhythmias. - We will follow orthostatics. - The patient will be hydrated with IV normal saline. - Cardiology consult will be obtained. - 2D echo and carotid Doppler will be obtained. - Differential diagnoses include neurally mediated, arrhythmia related, orthostatic hypotension, cardiogenic and less likely hypoglycemia.   Hypokalemia - We will replace potassium and follow magnesium level.  End-stage renal disease on hemodialysis [facility]) - Nephrology consult will be obtained for follow-up.  Type 2 diabetes mellitus with peripheral neuropathy (HCC) - The patient will be placed on supplement coverage with NovoLog. - We will continue Neurontin.  Hypothyroidism - We will continue Synthroid.  GERD without esophagitis - We will continue PPI therapy  Dyslipidemia - We will continue statin therapy.   DVT prophylaxis: Lovenox.  Advanced Care Planning:  Code Status: full code.  Family Communication:  The plan of care was discussed in details  with the patient (and family). I answered all questions. The patient agreed to proceed with the above mentioned plan. Further management will depend upon hospital course. Disposition Plan: Back to previous home environment Consults called: Nephrology. All the records are reviewed and case discussed with ED provider.  Status is: Observation  I certify that at the time of admission, it is my clinical judgment that the patient will require  hospital care extending less than 2 midnights.                            Dispo: The patient is from: Home              Anticipated d/c is to: Home              Patient currently is not medically stable to d/c.              Difficult to place patient: No  Christel Mormon M.D on 12/09/2021 at 6:46 PM  [facility]   From 7 PM-7 AM, contact night-coverage [email or web address]  CC: Primary care physician; [name], MD

## 2021-12-09 NOTE — ED Triage Notes (Signed)
Pt comes via EMS from Dialysis with c/o post CPR. Pt was at dialysis and was receiving treatment when pt became unresponsive. Pt had pulse per dialysis but unresponsive. EMS was en route when they were updated upon arrival in parking lot that CPR was in progress.  EMS reports no CPR was being performed on pt when they entered facility. Pt was A*OX4 and speaking with EMS. Pt does report feeling tired.   Pt was recently in hospital and this was first treatment back at center since being discharged home.  BP-116/74 HR-60 T-97.4 100% RA CBG-139  Pt did have 2.6 kg of fluid removed today.

## 2021-12-09 NOTE — ED Notes (Signed)
This RN gave report and the floor RN felt pt was inappropriate for medsurg and spoke with the provider who has now changed pt to Progressive care. Pt will be updated that the bed assignment has changed.

## 2021-12-09 NOTE — Assessment & Plan Note (Signed)
-   The patient will be placed on supplement coverage with NovoLog. - We will continue Neurontin. 

## 2021-12-09 NOTE — Assessment & Plan Note (Signed)
-   We will continue statin therapy. 

## 2021-12-09 NOTE — Assessment & Plan Note (Addendum)
-   The patient be admitted to the medical observation bed. - The patient was thought to have cardiac arrest without respiratory arrest and had 2 minutes of CPR. - We will monitor for arrhythmias. - We will follow orthostatics. - The patient will be hydrated with IV normal saline. - Cardiology consult will be obtained. - 2D echo and carotid Doppler will be obtained. - Differential diagnoses include neurally mediated, arrhythmia related, orthostatic hypotension, cardiogenic and less likely hypoglycemia.

## 2021-12-09 NOTE — ED Notes (Signed)
Pt reports she knew something was wrong this morning.  Sts her face felt "tingly."

## 2021-12-09 NOTE — ED Notes (Signed)
Called [name] on pt's behalf in regards to her medication.

## 2021-12-09 NOTE — ED Notes (Signed)
US at bedside

## 2021-12-09 NOTE — Assessment & Plan Note (Signed)
-   Nephrology consult will be obtained for follow-up.

## 2021-12-09 NOTE — ED Notes (Signed)
Pt brought to ED rm 30 at this time. This RN now assuming care.

## 2021-12-09 NOTE — Progress Notes (Signed)
[employer] - Nurse reports critical troponin with significant delta change 20 to 302. Asymptomatic Chronic sacral wound - present on admission Bedside patient also reported right inner cheek wound from biting it  Action - Repeat troponin ordered for 2 hours from last - trending up -437 -452 Repeat EKG - without STE, QT prolongation  LAFB not new Heparin infusion started Oral salt water rinses Chronic sacral wound dressing changes - lavage with saline - apply santyl to necrotic areas and wet to dry daily Patient and son updated on plan at bedside  Response  VSS, cardiac monitoring ongoing  Kathlene Cote NP

## 2021-12-09 NOTE — Assessment & Plan Note (Signed)
-   We will continue Synthroid. 

## 2021-12-10 ENCOUNTER — Observation Stay
Admit: 2021-12-10 | Discharge: 2021-12-10 | Disposition: A | Discharge status: Home / Self Care | Payer: Medicare Other | Attending: Family Medicine | Admitting: Family Medicine

## 2021-12-10 ENCOUNTER — Encounter: Payer: Self-pay | Admitting: Family Medicine

## 2021-12-10 DIAGNOSIS — N186 End stage renal disease: Secondary | ICD-10-CM | POA: Diagnosis not present

## 2021-12-10 DIAGNOSIS — R55 Syncope and collapse: Secondary | ICD-10-CM | POA: Diagnosis not present

## 2021-12-10 DIAGNOSIS — R778 Other specified abnormalities of plasma proteins: Secondary | ICD-10-CM | POA: Diagnosis not present

## 2021-12-10 DIAGNOSIS — K219 Gastro-esophageal reflux disease without esophagitis: Secondary | ICD-10-CM

## 2021-12-10 DIAGNOSIS — E785 Hyperlipidemia, unspecified: Secondary | ICD-10-CM

## 2021-12-10 LAB — ECHOCARDIOGRAM COMPLETE
AR max vel: 3.31 cm2
AV Area VTI: 3.79 cm2
AV Area mean vel: 3.27 cm2
AV Mean grad: 3 mmHg
AV Peak grad: 5 mmHg
Ao pk vel: 1.12 m/s
Area-P 1/2: 2.03 cm2
Height: 72 in
S' Lateral: 3 cm
Weight: 2320 oz

## 2021-12-10 LAB — APTT: aPTT: 34 seconds (ref 24–36)

## 2021-12-10 LAB — URINALYSIS, COMPLETE (UACMP) WITH MICROSCOPIC
Bilirubin Urine: NEGATIVE
Glucose, UA: NEGATIVE mg/dL
Hgb urine dipstick: NEGATIVE
Ketones, ur: NEGATIVE mg/dL
Nitrite: NEGATIVE
Protein, ur: 100 mg/dL — AB
Specific Gravity, Urine: 1.008 (ref 1.005–1.030)
WBC, UA: >50 WBC/hpf — ABNORMAL HIGH (ref 0–5)
pH: 9 — ABNORMAL HIGH (ref 5.0–8.0)

## 2021-12-10 LAB — PROTIME-INR
INR: 1.1 (ref 0.8–1.2)
Prothrombin Time: 14 seconds (ref 11.4–15.2)

## 2021-12-10 LAB — MAGNESIUM
Magnesium: 1.9 mg/dL (ref 1.7–2.4)
Magnesium: 2.1 mg/dL (ref 1.7–2.4)

## 2021-12-10 LAB — CBG MONITORING, ED
Glucose-Capillary: 75 mg/dL (ref 70–99)
Glucose-Capillary: 112 mg/dL — ABNORMAL HIGH (ref 70–99)
Glucose-Capillary: 76 mg/dL (ref 70–99)

## 2021-12-10 LAB — BASIC METABOLIC PANEL
Anion gap: 7 (ref 5–15)
BUN: 25 mg/dL — ABNORMAL HIGH (ref 6–20)
CO2: 27 mmol/L (ref 22–32)
Calcium: 8.3 mg/dL — ABNORMAL LOW (ref 8.9–10.3)
Chloride: 104 mmol/L (ref 98–111)
Creatinine, Ser: 3.75 mg/dL — ABNORMAL HIGH (ref 0.44–1.00)
GFR, Estimated: 13 mL/min — ABNORMAL LOW (ref >60)
Glucose, Bld: 83 mg/dL (ref 70–99)
Potassium: 3.4 mmol/L — ABNORMAL LOW (ref 3.5–5.1)
Sodium: 138 mmol/L (ref 135–145)

## 2021-12-10 LAB — HEMOGLOBIN A1C
Hgb A1c MFr Bld: 4.9 % (ref 4.8–5.6)
Mean Plasma Glucose: 93.93 mg/dL

## 2021-12-10 LAB — CBC
HCT: 25.6 % — ABNORMAL LOW (ref 36.0–46.0)
Hemoglobin: 8 g/dL — ABNORMAL LOW (ref 12.0–15.0)
MCH: 29.9 pg (ref 26.0–34.0)
MCHC: 31.3 g/dL (ref 30.0–36.0)
MCV: 95.5 fL (ref 80.0–100.0)
Platelets: 196 10*3/uL (ref 150–400)
RBC: 2.68 MIL/uL — ABNORMAL LOW (ref 3.87–5.11)
RDW: 17.5 % — ABNORMAL HIGH (ref 11.5–15.5)
WBC: 6.3 10*3/uL (ref 4.0–10.5)
nRBC: 0 % (ref 0.0–0.2)

## 2021-12-10 LAB — GLUCOSE, CAPILLARY
Glucose-Capillary: 106 mg/dL — ABNORMAL HIGH (ref 70–99)
Glucose-Capillary: 120 mg/dL — ABNORMAL HIGH (ref 70–99)

## 2021-12-10 LAB — TROPONIN I (HIGH SENSITIVITY)
Troponin I (High Sensitivity): 437 ng/L (ref <18)
Troponin I (High Sensitivity): 452 ng/L (ref <18)
Troponin I (High Sensitivity): 257 ng/L (ref <18)

## 2021-12-10 LAB — HEPARIN LEVEL (UNFRACTIONATED)
Heparin Unfractionated: 0.26 IU/mL — ABNORMAL LOW (ref 0.30–0.70)
Heparin Unfractionated: 0.34 IU/mL (ref 0.30–0.70)

## 2021-12-10 LAB — HEPATITIS B SURFACE ANTIGEN: Hepatitis B Surface Ag: NONREACTIVE

## 2021-12-10 LAB — HIV ANTIBODY (ROUTINE TESTING W REFLEX): HIV Screen 4th Generation wRfx: NONREACTIVE

## 2021-12-10 MED ORDER — ASPIRIN 325 MG PO TABS
325.0000 mg | ORAL_TABLET | Freq: Once | ORAL | Status: AC
Start: 2021-12-10 — End: 2021-12-10
  Administered 2021-12-10: 325 mg via ORAL
  Filled 2021-12-10: qty 1

## 2021-12-10 MED ORDER — LEVOTHYROXINE SODIUM 25 MCG PO TABS: 125.0000 ug | ORAL_TABLET | Freq: Every day | ORAL | Status: DC

## 2021-12-10 MED ORDER — ASPIRIN 81 MG PO TBEC
81.0000 mg | DELAYED_RELEASE_TABLET | Freq: Every day | ORAL | Status: DC
  Administered 2021-12-11: 81 mg via ORAL
  Filled 2021-12-10: qty 1

## 2021-12-10 MED ORDER — CHLORHEXIDINE GLUCONATE CLOTH 2 % EX PADS
6.0000 | MEDICATED_PAD | Freq: Every day | CUTANEOUS | Status: DC
  Administered 2021-12-11: 6 via TOPICAL

## 2021-12-10 MED ORDER — HEPARIN (PORCINE) 25000 UT/250ML-% IV SOLN
1100.0000 [IU]/h | INTRAVENOUS | Status: DC
  Administered 2021-12-10: 750 [IU]/h via INTRAVENOUS
  Administered 2021-12-10: 900 [IU]/h via INTRAVENOUS
  Filled 2021-12-10 (×2): qty 250

## 2021-12-10 MED ORDER — POTASSIUM CHLORIDE CRYS ER 20 MEQ PO TBCR
20.0000 meq | EXTENDED_RELEASE_TABLET | Freq: Once | ORAL | Status: AC
  Administered 2021-12-10: 20 meq via ORAL
  Filled 2021-12-10: qty 1

## 2021-12-10 MED ORDER — ONDANSETRON HCL 4 MG/2ML IJ SOLN: 4.0000 mg | Freq: Three times a day (TID) | INTRAMUSCULAR | Status: DC | PRN

## 2021-12-10 MED ORDER — HEPARIN BOLUS VIA INFUSION
1000.0000 [IU] | Freq: Once | INTRAVENOUS | Status: AC
  Administered 2021-12-10: 1000 [IU] via INTRAVENOUS
  Filled 2021-12-10: qty 1000

## 2021-12-10 MED ORDER — INSULIN ASPART 100 UNIT/ML IJ SOLN: 0.0000 [IU] | Freq: Three times a day (TID) | INTRAMUSCULAR | Status: DC

## 2021-12-10 MED ORDER — HEPARIN BOLUS VIA INFUSION
4000.0000 [IU] | Freq: Once | INTRAVENOUS | Status: AC
  Administered 2021-12-10: 4000 [IU] via INTRAVENOUS
  Filled 2021-12-10: qty 4000

## 2021-12-10 NOTE — Progress Notes (Signed)
ANTICOAGULATION CONSULT NOTE - Initial Consult  Pharmacy Consult for Heparin  Indication: chest pain/ACS  Allergies  Allergen Reactions   Amoxapine    Penicillins    Ceftazidime     Other reaction(s): Confusion, Hallucination, Unknown Encephalopathy - improved after dialysis/some concern for cephalosporin neurotoxicity Tolerated without confusion 06/2021. Encephalopathy - improved after dialysis/some concern for cephalosporin neurotoxicity Tolerated without confusion 06/2021.     Patient Measurements: Height: 6' (182.9 cm) Weight: 65.8 kg (145 lb) IBW/kg (Calculated) : 73.1 Heparin Dosing Weight: 65.8 kg   Vital Signs: Temp: 98.8 F (37.1 C) (08/17 1926) Temp Source: Oral (08/17 1926) BP: 141/70 (08/17 1926) Pulse Rate: 60 (08/17 1926)  Labs: Recent Labs    12/09/21 1548 12/09/21 2015 12/09/21 2329 12/10/21 0130 12/10/21 0252 12/10/21 0527 12/10/21 1044 12/10/21 1930  HGB 8.7*  --   --   --   --  8.0*  --   --   HCT 28.0*  --   --   --   --  25.6*  --   --   PLT 184  --   --   --   --  196  --   --   APTT  --   --   --  34  --   --   --   --   LABPROT  --   --   --  14.0  --   --   --   --   INR  --   --   --  1.1  --   --   --   --   HEPARINUNFRC  --   --   --   --   --   --  0.26* 0.34  CREATININE 2.94*  --   --   --   --  3.75*  --   --   TROPONINIHS 20*   < > 437*  --  452*  --  257*  --    < > = values in this interval not displayed.     Estimated Creatinine Clearance: 17 mL/min (A) (by C-G formula based on SCr of 3.75 mg/dL (H)).   Medical History: Past Medical History:  Diagnosis Date   Acute hemorrhoid 03/11/2015   Anxiety    Arthritis    Asthma    Cancer (HCC)    Chronic back pain    CLL (chronic lymphocytic leukemia) (HCC)    Depression    Diabetes mellitus without complication (HCC)    Genital herpes    type 2   Hypertension    Hypothyroidism    Vertigo   Heparin Dosing Weight: 65.8 kg   Assessment: 8/17: Pharmacy consulted to  dose heparin in this 56 year old F admitted with ACS. PMH is ESRD on HD (MWF), anxiety, asthma, CLL, depression, type 2 diabetes mellitus (A1c 4.9%), ANCA vasculitis, HLD, seizure disorder, chronic pain, GERD, hypertension and hypothyroidism. Pt presented with syncope and ACS having attained ROSC after 2 hrs of CPR. Intracranial and carotid imaging negative for any acute process. Troponin initially trended up from 302 >> 452 but now down to 257. ECG shows NSR w/ occasional PVCs, lateral T wave inversions and a prolonged QTcB interval of 609. Stress test planned for tomorrow. No prior anticoag noted but patient continuing on ASA 81 mg daily.  Date Time HL Rate/Comment 08/17 1044 0.26 Subtherapeutic; 750 >900 un/hr 8/17 1930 0.34 Therapeutic x1; 900 un/hr  Goal of Therapy:  Heparin level 0.3-0.7 units/ml Monitor  platelets by anticoagulation protocol: Yes   Plan: Therapeutic x1 HL 0.34 (1930 on 8/17) after most recent rate change. Continue heparin infusion at 900 units/hr Recheck anti-Xa level in 8 hrs to confirm rate, then daily once consecutively therapeutic. CTM CBC daily while on heparin gtt  Lorna Dibble, PharmD, [facility] Clinical Pharmacist 12/10/2021 8:36 PM

## 2021-12-10 NOTE — Progress Notes (Addendum)
ANTICOAGULATION CONSULT NOTE - Initial Consult  Pharmacy Consult for Heparin  Indication: chest pain/ACS  Allergies  Allergen Reactions   Amoxapine    Penicillins    Ceftazidime     Other reaction(s): Confusion, Hallucination, Unknown Encephalopathy - improved after dialysis/some concern for cephalosporin neurotoxicity Tolerated without confusion 06/2021. Encephalopathy - improved after dialysis/some concern for cephalosporin neurotoxicity Tolerated without confusion 06/2021.     Patient Measurements: Height: 6' (182.9 cm) Weight: 65.8 kg (145 lb) IBW/kg (Calculated) : 73.1 Heparin Dosing Weight: 65.8 kg   Vital Signs: Temp: 98.2 F (36.8 C) (08/17 0531) Temp Source: Oral (08/16 2014) BP: 144/65 (08/17 0700) Pulse Rate: 71 (08/17 0700)  Labs: Recent Labs    12/09/21 1548 12/09/21 2015 12/09/21 2329 12/10/21 0130 12/10/21 0252 12/10/21 0527  HGB 8.7*  --   --   --   --  8.0*  HCT 28.0*  --   --   --   --  25.6*  PLT 184  --   --   --   --  196  APTT  --   --   --  34  --   --   LABPROT  --   --   --  14.0  --   --   INR  --   --   --  1.1  --   --   CREATININE 2.94*  --   --   --   --  3.75*  TROPONINIHS 20* 302* 437*  --  452*  --     Estimated Creatinine Clearance: 17 mL/min (A) (by C-G formula based on SCr of 3.75 mg/dL (H)).   Medical History: Past Medical History:  Diagnosis Date   Acute hemorrhoid 03/11/2015   Anxiety    Arthritis    Asthma    Cancer (H[facility])    Chronic back pain    CLL (chronic lymphocytic leukemia) (HCC)    Depression    Diabetes mellitus without complication (HCC)    Genital herpes    type 2   Hypertension    Hypothyroidism    Vertigo     Assessment: 8/17: Pharmacy consulted to dose heparin in this 56 year old F admitted with ACS. PMH is ESRD on HD (MWF), anxiety, asthma, CLL, depression, type 2 diabetes mellitus (A1c 4.9%), ANCA vasculitis, HLD, seizure disorder, chronic pain, GERD, hypertension and hypothyroidism. Pt  presented with syncope and ACS having attained ROSC after 2 hrs of CPR. Intracranial and carotid imaging negative for any acute process. Troponin initially trended up from 302 >> 452 but now down to 257. ECG shows NSR w/ occasional PVCs, lateral T wave inversions and a prolonged QTcB interval of 609. Stress test planned for tomorrow. No prior anticoag noted but patient continuing on ASA 81 mg daily.  Date Time HL Rate/Comment 08/17 1044 0.26 Subtherapeutic  Goal of Therapy:  Heparin level 0.3-0.7 units/ml Monitor platelets by anticoagulation protocol: Yes   Plan: Give 1000 unit bolus x 1 Increase heparin infusion to 900 units/hr Check anti-Xa level in 8 hrs and daily while on heparin CBC daily while on heparin  Dara Hoyer, PharmD PGY-1 Pharmacy Resident 12/10/2021 7:42 AM

## 2021-12-10 NOTE — Consult Note (Signed)
Tonya Myers NOTE       Patient ID: Tonya Myers MRN: [id number] DOB/AGE: 1966-02-11 56 y.o.  Admit date: 12/09/2021 Referring Physician Dr. Eugenie Norrie Primary Physician Dr. [name]  Primary Cardiologist none Reason for Consultation syncope  HPI: Tonya Myers is a 2yoF with a PMH of ESRD on MWF dialysis, ANCA vasculitis, type 2 diabetes, CLL, hypothyroidism, hypertension, seizure disorder on lacosamide, chronic pain and neuropathy, chronic decubitus ulcer, history of strep pneumo meningitis with epidural abscess and thoracolumbar discitis s/p 10-11 laminectomy and abscess evacuation in 2020 who presented to [facility] ED 12/09/2020 from dialysis due to loss of consciousness.  She was receiving her regularly scheduled dialysis treatment where she felt lightheaded, then passed out.  Per EMS, dialysis staff performed 2 minutes of CPR prior to their arrival.  When they arrived the patient was alert, oriented and conversant.  Cardiology is consulted for further assistance.  The patient was recently hospitalized at [facility] from 8/11 to 8/15 due to altered mental status.  She had an extensive work-up, infectious causes of her encephalopathy ruled out, ultimately was attributed to polypharmacy, mental status improved with holding Ativan, Klonopin, olanzapine, pregabalin, and gabapentin.  Notably, when she had dialysis on 811 it was stopped early due to hypotension without any fluid removal.  She was restarted on a low-dose of pregabalin only daily at discharge in addition to losartan.  She presents with her boyfriend who contributes to history.  She says she was not feeling good in general when she woke up yesterday morning, although felt a little bit better after eating something.  She says she went to dialysis and felt somewhat lightheaded during treatment but otherwise does not remember what happened.  She apparently lost consciousness and staff at dialysis performed 2 minutes of  CPR.  Unclear what her vitals were at that time.  Once EMS arrived she was alert, oriented and conversant.  She notes she had a similar episode of lightheadedness and dizziness during dialysis on Monday and has a history of low blood pressure with dialysis in the past.  At my time of evaluation this morning, she feels well and even better than when she came in.  She has reproducible chest wall tenderness from CPR, but denies chest pain, shortness of breath, palpitations, dizziness, orthopnea, or lower extremity edema.  She has never been seen by a cardiologist before, denies history of stress test or heart catheterization.  Recent vitals are notable for blood pressure of 144/65, heart rate 72.  SPO2 98% on room air.  Labs are notable for slight hypokalemia potassium 3.4, BUN/creatinine 25/3.75 GFR 13.  High-sensitivity troponin uptrending from [phone] with repeats pending.  H&H 8.0/25.6 platelets 196.   Review of systems complete and found to be negative unless listed above     Past Medical History:  Diagnosis Date   Acute hemorrhoid 03/11/2015   Anxiety    Arthritis    Asthma    Cancer ([place])    Chronic back pain    CLL (chronic lymphocytic leukemia) ([place])    Depression    Diabetes mellitus without complication ([place])    Genital herpes    type 2   Hypertension    Hypothyroidism    Vertigo     Past Surgical History:  Procedure Laterality Date   ABDOMINAL HYSTERECTOMY     BILATERAL SALPINGOOPHORECTOMY  2009   BREAST BIOPSY Right 05/17/2016   FIBROADENOMATOUS CHANGE AND SCLEROSING ADENOSIS WITH   COLONOSCOPY WITH PROPOFOL N/A 06/16/2015  Procedure: COLONOSCOPY WITH PROPOFOL;  Surgeon: [name], MD;  Location: [facility] ENDOSCOPY;  Service: Endoscopy;  Laterality: N/A;   DIALYSIS/PERMA CATHETER INSERTION N/A 12/17/2020   Procedure: DIALYSIS/PERMA CATHETER INSERTION;  Surgeon: Algernon Huxley, MD;  Location: [place] CV LAB;  Service: Cardiovascular;  Laterality: N/A;    LAPAROSCOPIC SUPRACERVICAL HYSTERECTOMY  2009   due to [place] N/A 12/10/2020   Procedure: TEMPORARY DIALYSIS CATHETER;  Surgeon: Katha Cabal, MD;  Location: [place] CV LAB;  Service: Cardiovascular;  Laterality: N/A;   THORACIC LAMINECTOMY FOR EPIDURAL ABSCESS Bilateral 06/17/2020   Procedure: THORACIC LAMINECTOMY FOR EPIDURAL ABSCESS;  Surgeon: Deetta Perla, MD;  Location: [facility] ORS;  Service: Neurosurgery;  Laterality: Bilateral;    (Not in a hospital admission)  Social History   Socioeconomic History   Marital status: Single    Spouse name: Not on file   Number of children: Not on file   Years of education: Not on file   Highest education level: Not on file  Occupational History   Not on file  Tobacco Use   Smoking status: Never   Smokeless tobacco: Never  Vaping Use   Vaping Use: Never used  Substance and Sexual Activity   Alcohol use: Yes    Alcohol/week: 1.0 standard drink of alcohol    Types: 1 Cans of beer per week   Drug use: No   Sexual activity: Not Currently    Birth control/protection: None  Other Topics Concern   Not on file  Social History Narrative   Not on file   Social Determinants of Health   Financial Resource Strain: Not on file  Food Insecurity: Not on file  Transportation Needs: Not on file  Physical Activity: Not on file  Stress: Not on file  Social Connections: Not on file  Intimate Partner Violence: Not on file    Family History  Problem Relation Age of Onset   Cancer Paternal Aunt    Breast cancer Maternal Aunt 70      PHYSICAL EXAM General: Pleasant middle-aged black female, well nourished, in no acute distress.  Sitting upright in hospital bed with her boyfriend at bedside HEENT:  Normocephalic and atraumatic. Neck:  No JVD.  Lungs: Normal respiratory effort on room air. Clear bilaterally to auscultation. No wheezes, crackles, rhonchi.  Heart: HRRR . Normal S1 and S2 without  gallops or murmurs.  Abdomen: Non-distended appearing.  Msk: Normal strength and tone for age. Extremities: Warm and well perfused. No clubbing, cyanosis.  No peripheral edema.  Neuro: Alert and oriented X 3. Psych:  Answers questions appropriately.   Labs:   Lab Results  Component Value Date   WBC 6.3 12/10/2021   HGB 8.0 (L) 12/10/2021   HCT 25.6 (L) 12/10/2021   MCV 95.5 12/10/2021   PLT 196 12/10/2021    Recent Labs  Lab 12/10/21 0527  NA 138  K 3.4*  CL 104  CO2 27  BUN 25*  CREATININE 3.75*  CALCIUM 8.3*  GLUCOSE 83   Lab Results  Component Value Date   CKTOTAL 19 (L) 12/03/2020    Lab Results  Component Value Date   CHOL 91 10/02/2020   Lab Results  Component Value Date   HDL 25 (L) 10/02/2020   Lab Results  Component Value Date   LDLCALC 47 10/02/2020   Lab Results  Component Value Date   TRIG 143 11/22/2020   TRIG 96 10/02/2020  Lab Results  Component Value Date   CHOLHDL 3.6 10/02/2020   No results found for: "LDLDIRECT"    Radiology: US Carotid Bilateral  Result Date: 12/09/2021 CLINICAL DATA:  Recent syncopal episode EXAM: BILATERAL CAROTID DUPLEX ULTRASOUND TECHNIQUE: Pearline Cables scale imaging, color Doppler and duplex ultrasound were performed of bilateral carotid and vertebral arteries in the neck. COMPARISON:  None Available. FINDINGS: Criteria: Quantification of carotid stenosis is based on velocity parameters that correlate the residual internal carotid diameter with NASCET-based stenosis levels, using the diameter of the distal internal carotid lumen as the denominator for stenosis measurement. The following velocity measurements were obtained: RIGHT ICA: 84/16 cm/sec CCA: 73/22 cm/sec SYSTOLIC ICA/CCA RATIO:  1.0 ECA: 90 cm/sec LEFT ICA: 139/31 cm/sec CCA: 025/42 cm/sec SYSTOLIC ICA/CCA RATIO:  1.3 ECA: 79 cm/sec RIGHT CAROTID ARTERY: Preliminary grayscale images demonstrate no significant atherosclerotic plaque. Waveforms, velocities and flow  velocity ratios show no evidence of focal hemodynamically significant stenosis. RIGHT VERTEBRAL ARTERY:  Antegrade in nature. LEFT CAROTID ARTERY: Limited grayscale images demonstrate no significant atherosclerotic plaque. Waveforms, velocities and flow velocity ratios suggest a mild stenosis in the 50-69% range in the lower end of that spectrum. LEFT VERTEBRAL ARTERY:  Antegrade in nature. IMPRESSION: No focal hemodynamically significant stenosis in the right carotid system. Velocities suggest a mild degree of stenosis in the left carotid artery in the lower end of the 50-69% range. Electronically Signed   By: Inez Catalina M.D.   On: 12/09/2021 20:36   CT Head Wo Contrast  Result Date: 12/09/2021 CLINICAL DATA:  Syncope/presyncope, cerebrovascular cause suspected EXAM: CT HEAD WITHOUT CONTRAST TECHNIQUE: Contiguous axial images were obtained from the base of the skull through the vertex without intravenous contrast. RADIATION DOSE REDUCTION: This exam was performed according to the departmental dose-optimization program which includes automated exposure control, adjustment of the mA and/or kV according to patient size and/or use of iterative reconstruction technique. COMPARISON:  July 2022 FINDINGS: Brain: There is no acute intracranial hemorrhage, mass effect, or edema. Gray-white differentiation is preserved. There is no extra-axial fluid collection. Ventricles and sulci are stable in size and configuration. Vascular: There is mild atherosclerotic calcification at the skull base. Skull: Calvarium is unremarkable. Sinuses/Orbits: No acute finding. Other: None. IMPRESSION: No acute intracranial abnormality. Electronically Signed   By: Macy Mis M.D.   On: 12/09/2021 16:40    ECHO 04/23/2021 Exam Type:     ECHOCARDIOGRAM FOLLOW UP/LIMITED ECHO   Study Info  Indications       - Hx effusion with hypotension   Limited two-dimensional, color flow and Doppler transthoracic echocardiogram  is performed.    Staff  Reading Fellow:     Yates Decamp MD  Sonographer:     Cindy Hazy, RDCS  Ordering Physician:     Vida Roller   Account #:     [id number]    Summary    1. The left ventricle is normal in size with mildly increased wall  thickness.    2. The left ventricular systolic function is hyperdynamic, LVEF is visually  estimated at >70%.    3. The right ventricle is normal in size, with normal systolic function.    4. There is a moderate circumferential pericardial effusion.    5. There is no echocardiographic evidence of tamponade physiology.    Left Ventricle    The left ventricle is normal in size with mildly increased wall thickness.    The left ventricular systolic function is hyperdynamic, LVEF is visually  estimated at >70%.    There is normal left ventricular diastolic function.   Right Ventricle    The right ventricle is normal in size, with normal systolic function.    Left Atrium    The left atrium is normal in size.   Right Atrium    The right atrium is normal  in size.    Aortic Valve    The aortic valve is trileaflet with normal appearing leaflets with normal  excursion.    There is no significant aortic regurgitation.    There is no evidence of a significant transvalvular gradient.   Pulmonic Valve    Pulmonary valve is not well visualized.    There is no significant pulmonic regurgitation.    There is no evidence of a significant transvalvular gradient.   Mitral Valve    The mitral valve leaflets are normal with normal leaflet mobility.    There is no significant mitral valve regurgitation.   Tricuspid Valve    The tricuspid valve leaflets are normal, with normal leaflet mobility.    There is no significant tricuspid regurgitation.    The pulmonary systolic pressure cannot be estimated due to insufficient TR  signal.    Pericardium/Pleural    There is a moderate circumferential pericardial effusion.    Maximal dimension of the  pericardial effusion:  1.2 cm.    There is no right ventricular diastolic collapse, no right atrial inversion  and no exaggerated respiratory variation in transvalvular Doppler flow  velocities.    Pericardial fat pad  present.    There is no echocardiographic evidence of tamponade physiology.   Inferior Vena Cava    IVC size and inspiratory change suggest normal right atrial pressure. (0-5  mmHg).   Aorta    The aorta is normal in size in the visualized segments.    Mitral Valve  ----------------------------------------------------------------------  Name                                 Value        Normal  ----------------------------------------------------------------------   MV Diastolic Function  ----------------------------------------------------------------------  MV E Peak Velocity                 74 cm/s                MV A Peak Velocity                 67 cm/s                 MV E/A                                 1.1                 MV Annular TDI  ----------------------------------------------------------------------  MV Septal e' Velocity             6.3 cm/s         >=8.0  MV Lateral e' Velocity            9.7 cm/s        >=10.0  MV e' Average                          8.0  MV E/e' (Average)                      9.6   Tricuspid Valve  ----------------------------------------------------------------------  Name                                 Value        Normal  ----------------------------------------------------------------------   Estimated PAP/RSVP  ----------------------------------------------------------------------  RA Pressure                         3 mmHg           <=5   Aorta  ----------------------------------------------------------------------  Name                                 Value        Normal  ----------------------------------------------------------------------   Ascending Aorta   ----------------------------------------------------------------------  Ao Root Diameter (2D)               3.5 cm                Ao Root Diam Index (2D)         18.4 cm/m2   Venous  ----------------------------------------------------------------------  Name                                 Value        Normal  ----------------------------------------------------------------------   IVC/SVC  ----------------------------------------------------------------------  IVC Diameter (Exp 2D)               1.7 cm         <=2.1   Ventricles  ----------------------------------------------------------------------  Name                                 Value        Normal  ----------------------------------------------------------------------   LV Dimensions 2D/MM  ----------------------------------------------------------------------  IVS Diastolic Thickness (2D)        1.4 cm       0.6-0.9  LVID Diastole (2D)                  4.2 cm       6.3-8.7  LVPW Diastolic Thickness  (2D)                                1.1 cm       0.6-0.9  LVID Systole (2D)                   2.9 cm       2.2-3.5   RV Dimensions 2D/MM  ----------------------------------------------------------------------  TAPSE                               2.5 cm         >=1.7   Atria  ----------------------------------------------------------------------  Name                                 Value        Normal  ----------------------------------------------------------------------  LA Dimensions  ----------------------------------------------------------------------  LA Dimension (2D)                   4.5 cm       2.7-3.8   Pericardium  ----------------------------------------------------------------------  Name                                 Value        Normal  ----------------------------------------------------------------------   Pericardium 2D/MM   ----------------------------------------------------------------------  Pericardial Effusion  Diastole (2D)                       1.2 cm    Report Signatures  Finalized by Stephani Police  MD on 04/23/2021 11:43 AM  Resident Norva Pavlov  MD on 04/22/2021 03:36 PM  TELEMETRY reviewed by me: Sinus rhythm rate 60s to 70s  EKG reviewed by me: Sinus rhythm rate 85, lateral T wave inversions and prolonged QT interval  ASSESSMENT AND PLAN:  Tonya Myers is a 34yoF with a PMH of ESRD on MWF dialysis, ANCA vasculitis, type 2 diabetes, CLL, hypothyroidism, hypertension, seizure disorder on lacosamide, chronic pain and neuropathy, chronic decubitus ulcer, history of strep pneumo meningitis with epidural abscess and thoracolumbar discitis s/p 10-11 laminectomy and abscess evacuation in 2020 who presented to [facility] ED 12/09/2020 from dialysis due to loss of consciousness.  She was receiving her regularly scheduled dialysis treatment where she felt lightheaded, then passed out.  Per EMS, dialysis staff performed 2 minutes of CPR prior to their arrival.  When they arrived the patient was alert, oriented and conversant.  Cardiology is consulted for further assistance.  #Syncope #Elevated troponin #ESRD on MWF dialysis The patient presents after losing consciousness during her regularly scheduled dialysis yesterday 8/16.  She remembers feeling lightheaded before her treatment, but does not remember much else.  Staff at the dialysis center performed 2 minutes of CPR, and by the time EMS arrived she was alert and conversant.  She denies chest pain, and is overall feeling well now.  Troponin is uptrending from [id number] and has come down to 257 now.  EKG shows sinus rhythm with some lateral T wave inversions and a prolonged QT interval.  Notably, she reports similar episodes of lightheadedness and low blood pressure during dialysis treatments in the past - suspect her syncopal episode was vasovagal/hypotensive in  nature, and troponin elevation is demand ischemia in that setting. -S/p 325 mg aspirin, continue 81 mg aspirin daily -Continue heparin drip for now -Continuous monitoring on telemetry -Monitor and replete electrolytes as needed -Echocardiogram complete resulted with preserved LVEF of 55-60% without WMA's or other valvular pathologies. -Discussed with the patient and her boyfriend the options for further ischemic evaluation.  She strongly prefers to avoid invasive procedures, but is agreeable to having a [employer].  We will make n.p.o. at midnight and plan for this tomorrow morning.  This patient's plan of care was discussed and created with Dr. Saralyn Pilar and he is in agreement.  Signed: Tristan Schroeder , PA-C 12/10/2021, 8:06 AM [facility]

## 2021-12-10 NOTE — Progress Notes (Signed)
ANTICOAGULATION CONSULT NOTE - Initial Consult  Pharmacy Consult for Heparin  Indication: chest pain/ACS  Allergies  Allergen Reactions   Amoxapine    Penicillins    Ceftazidime     Other reaction(s): Confusion, Hallucination, Unknown Encephalopathy - improved after dialysis/some concern for cephalosporin neurotoxicity Tolerated without confusion 06/2021. Encephalopathy - improved after dialysis/some concern for cephalosporin neurotoxicity Tolerated without confusion 06/2021.     Patient Measurements: Height: 6' (182.9 cm) Weight: 65.8 kg (145 lb) IBW/kg (Calculated) : 73.1 Heparin Dosing Weight: 65.8 kg   Vital Signs: Temp: 98 F (36.7 C) (08/16 2258) Temp Source: Oral (08/16 2014) BP: 139/84 (08/16 2258) Pulse Rate: 77 (08/16 2258)  Labs: Recent Labs    12/09/21 1548 12/09/21 2015 12/09/21 2329  HGB 8.7*  --   --   HCT 28.0*  --   --   PLT 184  --   --   CREATININE 2.94*  --   --   TROPONINIHS 20* 302* 437*    Estimated Creatinine Clearance: 21.7 mL/min (A) (by C-G formula based on SCr of 2.94 mg/dL (H)).   Medical History: Past Medical History:  Diagnosis Date   Acute hemorrhoid 03/11/2015   Anxiety    Arthritis    Asthma    Cancer ([facility])    Chronic back pain    CLL (chronic lymphocytic leukemia) ([facility])    Depression    Diabetes mellitus without complication ([facility])    Genital herpes    type 2   Hypertension    Hypothyroidism    Vertigo     Medications:  (Not in a hospital admission)   Assessment: Pharmacy consulted to dose heparin in this 56 year old female admitted with ACS/NSTEMI.  No prior anticoag noted. CrCl = 21.7 ml/min  Goal of Therapy:  Heparin level 0.3-0.7 units/ml Monitor platelets by anticoagulation protocol: Yes   Plan:  Give 4000 units bolus x 1 Start heparin infusion at 750 units/hr Check anti-Xa level in 8 hours and daily while on heparin Continue to monitor H&H and platelets  Kodie Pick D 12/10/2021,1:33 AM

## 2021-12-10 NOTE — Progress Notes (Signed)
PROGRESS NOTE    Tonya Myers  UVO:[id number] DOB: 12-13-1965 DOA: 12/09/2021 PCP: [name], MD    Brief Narrative:  Tonya Myers is a 56 y.o. African American female with medical history significant for ESRD on HD, anxiety, asthma, CLL, depression, type 2 diabetes mellitus, hypertension and hypothyroidism, who presented to the ER with acute onset of syncope while at hemodialysis today.  The patient apparently had half of his HD session.  He was thought to have cardiac arrest and had 2 minutes of CPR however by arrival of EMS he was conversant at his baseline.  No chest pain or palpitations though she had soreness after CPR.  8/17 no cp, dizziness, or sob    Consultants:  Cardiology nephrology  Procedures:   Antimicrobials:      Subjective: No dizziness while sitting in bed  Objective: Vitals:   12/10/21 0900 12/10/21 1000 12/10/21 1049 12/10/21 1234  BP: 128/78 132/72 (!) 152/74 (!) 149/81  Pulse: 72 60 (!) 53 61  Resp: '18 17  16  '$ Temp:   98.6 F (37 C) 97.8 F (36.6 C)  TempSrc:   Oral Oral  SpO2: 99%  100% 95%  Weight:      Height:        Intake/Output Summary (Last 24 hours) at 12/10/2021 1612 Last data filed at 12/10/2021 1212 Gross per 24 hour  Intake 2042 ml  Output --  Net 2042 ml   Filed Weights   12/09/21 2336  Weight: 65.8 kg    Examination:  Calm, NAD Cta no w/r Reg s1/s2 no gallop Soft benign +bs No edema Awake and alert Mood and affect appropriate in current setting     Data Reviewed: I have personally reviewed following labs and imaging studies  CBC: Recent Labs  Lab 12/09/21 1548 12/10/21 0527  WBC 4.4 6.3  NEUTROABS 1.3*  --   HGB 8.7* 8.0*  HCT 28.0* 25.6*  MCV 94.6 95.5  PLT 184 742   Basic Metabolic Panel: Recent Labs  Lab 12/09/21 1548 12/09/21 2329 12/10/21 0527  NA 137  --  138  K 3.3*  --  3.4*  CL 101  --  104  CO2 25  --  27  GLUCOSE 121*  --  83  BUN 16  --  25*  CREATININE 2.94*  --  3.75*   CALCIUM 8.0*  --  8.3*  MG  --  1.9  --    GFR: Estimated Creatinine Clearance: 17 mL/min (A) (by C-G formula based on SCr of 3.75 mg/dL (H)). Liver Function Tests: No results for input(s): "AST", "ALT", "ALKPHOS", "BILITOT", "PROT", "ALBUMIN" in the last 168 hours. No results for input(s): "LIPASE", "AMYLASE" in the last 168 hours. No results for input(s): "AMMONIA" in the last 168 hours. Coagulation Profile: Recent Labs  Lab 12/10/21 0130  INR 1.1   Cardiac Enzymes: No results for input(s): "CKTOTAL", "CKMB", "CKMBINDEX", "TROPONINI" in the last 168 hours. BNP (last 3 results) No results for input(s): "PROBNP" in the last 8760 hours. HbA1C: Recent Labs    12/09/21 1548  HGBA1C 4.9   CBG: Recent Labs  Lab 12/10/21 0746 12/10/21 1119 12/10/21 1600  GLUCAP 75 112* 76   Lipid Profile: No results for input(s): "CHOL", "HDL", "LDLCALC", "TRIG", "CHOLHDL", "LDLDIRECT" in the last 72 hours. Thyroid Function Tests: No results for input(s): "TSH", "T4TOTAL", "FREET4", "T3FREE", "THYROIDAB" in the last 72 hours. Anemia Panel: No results for input(s): "VITAMINB12", "FOLATE", "FERRITIN", "TIBC", "IRON", "RETICCTPCT" in the last 72 hours.  Sepsis Labs: No results for input(s): "PROCALCITON", "LATICACIDVEN" in the last 168 hours.  No results found for this or any previous visit (from the past 240 hour(s)).       Radiology Studies: ECHOCARDIOGRAM COMPLETE  Result Date: 12/10/2021    ECHOCARDIOGRAM REPORT   Patient Name:   Tonya Myers Date of Exam: 12/10/2021 Medical Rec #:  [id number]       Height:       72.0 in Accession #:    [id number]      Weight:       145.0 lb Date of Birth:  05-30-1965       BSA:          1.858 m Patient Age:    26 years        BP:           144/65 mmHg Patient Gender: F               HR:           71 bpm. Exam Location:  [facility] Procedure: 2D Echo, Cardiac Doppler and Color Doppler Indications:     Syncope R55  History:         Patient has prior history  of Echocardiogram examinations, most                  recent 12/05/2020. Risk Factors:Diabetes and Hypertension.  Sonographer:     Sherrie Sport Referring Phys:  [id number] [place] Diagnosing Phys: Isaias Cowman MD  Sonographer Comments: Suboptimal apical window. IMPRESSIONS  1. Left ventricular ejection fraction, by estimation, is 55 to 60%. The left ventricle has normal function. The left ventricle has no regional wall motion abnormalities. Left ventricular diastolic parameters were normal.  2. Right ventricular systolic function is normal. The right ventricular size is normal.  3. The mitral valve is normal in structure. Trivial mitral valve regurgitation. No evidence of mitral stenosis.  4. The aortic valve is normal in structure. Aortic valve regurgitation is not visualized. No aortic stenosis is present.  5. The inferior vena cava is normal in size with greater than 50% respiratory variability, suggesting right atrial pressure of 3 mmHg. FINDINGS  Left Ventricle: Left ventricular ejection fraction, by estimation, is 55 to 60%. The left ventricle has normal function. The left ventricle has no regional wall motion abnormalities. The left ventricular internal cavity size was normal in size. There is  no left ventricular hypertrophy. Left ventricular diastolic parameters were normal. Right Ventricle: The right ventricular size is normal. No increase in right ventricular wall thickness. Right ventricular systolic function is normal. Left Atrium: Left atrial size was normal in size. Right Atrium: Right atrial size was normal in size. Pericardium: There is no evidence of pericardial effusion. Mitral Valve: The mitral valve is normal in structure. Trivial mitral valve regurgitation. No evidence of mitral valve stenosis. Tricuspid Valve: The tricuspid valve is normal in structure. Tricuspid valve regurgitation is trivial. No evidence of tricuspid stenosis. Aortic Valve: The aortic valve is normal in structure.  Aortic valve regurgitation is not visualized. No aortic stenosis is present. Aortic valve mean gradient measures 3.0 mmHg. Aortic valve peak gradient measures 5.0 mmHg. Aortic valve area, by VTI measures 3.79 cm. Pulmonic Valve: The pulmonic valve was normal in structure. Pulmonic valve regurgitation is not visualized. No evidence of pulmonic stenosis. Aorta: The aortic root is normal in size and structure. Venous: The inferior vena cava is normal in size with greater than 50% respiratory variability,  suggesting right atrial pressure of 3 mmHg. IAS/Shunts: No atrial level shunt detected by color flow Doppler.  LEFT VENTRICLE PLAX 2D LVIDd:         4.20 cm   Diastology LVIDs:         3.00 cm   LV e' medial:    4.46 cm/s LV PW:         1.20 cm   LV E/e' medial:  14.4 LV IVS:        1.20 cm   LV e' lateral:   6.96 cm/s LVOT diam:     2.00 cm   LV E/e' lateral: 9.2 LV SV:         74 LV SV Index:   40 LVOT Area:     3.14 cm  RIGHT VENTRICLE RV Basal diam:  3.00 cm RV S prime:     17.50 cm/s TAPSE (M-mode): 4.3 cm LEFT ATRIUM             Index        RIGHT ATRIUM           Index LA diam:        3.50 cm 1.88 cm/m   RA Area:     21.00 cm LA Vol (A2C):   56.3 ml 30.29 ml/m  RA Volume:   55.00 ml  29.60 ml/m LA Vol (A4C):   41.8 ml 22.49 ml/m LA Biplane Vol: 51.9 ml 27.93 ml/m  AORTIC VALVE AV Area (Vmax):    3.31 cm AV Area (Vmean):   3.27 cm AV Area (VTI):     3.79 cm AV Vmax:           112.00 cm/s AV Vmean:          80.200 cm/s AV VTI:            0.196 m AV Peak Grad:      5.0 mmHg AV Mean Grad:      3.0 mmHg LVOT Vmax:         118.00 cm/s LVOT Vmean:        83.400 cm/s LVOT VTI:          0.236 m LVOT/AV VTI ratio: 1.21  AORTA Ao Root diam: 3.30 cm MITRAL VALVE               TRICUSPID VALVE MV Area (PHT): 2.03 cm    TR Peak grad:   16.3 mmHg MV Decel Time: 373 msec    TR Vmax:        202.00 cm/s MV E velocity: 64.30 cm/s MV A velocity: 78.80 cm/s  SHUNTS MV E/A ratio:  0.82        Systemic VTI:  0.24 m                             Systemic Diam: 2.00 cm Isaias Cowman MD Electronically signed by Isaias Cowman MD Signature Date/Time: 12/10/2021/1:15:32 PM    Final    US Carotid Bilateral  Result Date: 12/09/2021 CLINICAL DATA:  Recent syncopal episode EXAM: BILATERAL CAROTID DUPLEX ULTRASOUND TECHNIQUE: Pearline Cables scale imaging, color Doppler and duplex ultrasound were performed of bilateral carotid and vertebral arteries in the neck. COMPARISON:  None Available. FINDINGS: Criteria: Quantification of carotid stenosis is based on velocity parameters that correlate the residual internal carotid diameter with NASCET-based stenosis levels, using the diameter of the distal internal carotid lumen as the denominator for stenosis measurement. The following  velocity measurements were obtained: RIGHT ICA: 84/16 cm/sec CCA: 40/98 cm/sec SYSTOLIC ICA/CCA RATIO:  1.0 ECA: 90 cm/sec LEFT ICA: 139/31 cm/sec CCA: 119/14 cm/sec SYSTOLIC ICA/CCA RATIO:  1.3 ECA: 79 cm/sec RIGHT CAROTID ARTERY: Preliminary grayscale images demonstrate no significant atherosclerotic plaque. Waveforms, velocities and flow velocity ratios show no evidence of focal hemodynamically significant stenosis. RIGHT VERTEBRAL ARTERY:  Antegrade in nature. LEFT CAROTID ARTERY: Limited grayscale images demonstrate no significant atherosclerotic plaque. Waveforms, velocities and flow velocity ratios suggest a mild stenosis in the 50-69% range in the lower end of that spectrum. LEFT VERTEBRAL ARTERY:  Antegrade in nature. IMPRESSION: No focal hemodynamically significant stenosis in the right carotid system. Velocities suggest a mild degree of stenosis in the left carotid artery in the lower end of the 50-69% range. Electronically Signed   By: Inez Catalina M.D.   On: 12/09/2021 20:36   CT Head Wo Contrast  Result Date: 12/09/2021 CLINICAL DATA:  Syncope/presyncope, cerebrovascular cause suspected EXAM: CT HEAD WITHOUT CONTRAST TECHNIQUE: Contiguous axial images were  obtained from the base of the skull through the vertex without intravenous contrast. RADIATION DOSE REDUCTION: This exam was performed according to the departmental dose-optimization program which includes automated exposure control, adjustment of the mA and/or kV according to patient size and/or use of iterative reconstruction technique. COMPARISON:  July 2022 FINDINGS: Brain: There is no acute intracranial hemorrhage, mass effect, or edema. Gray-white differentiation is preserved. There is no extra-axial fluid collection. Ventricles and sulci are stable in size and configuration. Vascular: There is mild atherosclerotic calcification at the skull base. Skull: Calvarium is unremarkable. Sinuses/Orbits: No acute finding. Other: None. IMPRESSION: No acute intracranial abnormality. Electronically Signed   By: Macy Mis M.D.   On: 12/09/2021 16:40        Scheduled Meds:  amLODipine  5 mg Oral Daily   [START ON 12/11/2021] aspirin EC  81 mg Oral Daily   [START ON 12/11/2021] Chlorhexidine Gluconate Cloth  6 each Topical Q0600   insulin aspart  0-15 Units Subcutaneous TID AC & HS   [START ON 12/11/2021] levothyroxine  125 mcg Oral Q0600   montelukast  10 mg Oral QHS   pantoprazole  40 mg Oral Daily   potassium chloride  20 mEq Oral Once   rosuvastatin  10 mg Oral QHS   Continuous Infusions:  heparin 900 Units/hr (12/10/21 1212)    Assessment & Plan:   Principal Problem:   Syncope Active Problems:   Hypokalemia   Type 2 diabetes mellitus with peripheral neuropathy (HCC)   End-stage renal disease on hemodialysis ([place])   Hypothyroidism   Dyslipidemia   GERD without esophagitis   Elevated troponin   Syncope Elevated troponin - The patient was thought to have cardiac arrest without respiratory arrest and had 2 minutes of CPR. 8/17 was started on IV fluids Cardiology consulted input appreciated CT negative for acute changes carotid ultrasound mild stenosis of left carotid Echo normal  EF Troponin elevated started on heparin drip Cardiology suspect syncopal episode was vasovagal/hypotensive in nature and troponin elevation is demand ischemia in that setting.  Also with CPR this may have raised CK and troponin Continue 81 mg aspirin Can continue heparin drip for now Plan on [place] in a.m. DC IV fluids as patient is on dialysis and BP stable   Hypokalemia  Was replaced   End-stage renal disease on hemodialysis ([place]) - On dialysis nephrology following Next treatment in a.m.      Type 2 diabetes mellitus with peripheral  neuropathy ([place]) Continue R-ISS Continue Neurontin    Hypothyroidism On Synthroid.   GERD without esophagitis On PPI therapy   Dyslipidemia On statin therapy.     DVT prophylaxis: Present drip Code Status: Full full Family Communication: Boyfriend at bedside Disposition Plan:  Status is: Observation The patient remains OBS appropriate and will d/c before 2 midnights.        LOS: 0 days   Time spent: 35 minutes    Nolberto Hanlon, MD [facility] Pager [phone]  If 7PM-7AM, please contact night-coverage 12/10/2021, 4:12 PM

## 2021-12-10 NOTE — ED Notes (Signed)
Provider at bedside

## 2021-12-10 NOTE — Progress Notes (Signed)
[facility]  ROUNDING NOTE   Subjective:   Tonya Myers is a 56 year old female with past medical history including depression, type 2 diabetes, asthma, hypertension, hypothyroidism, CLL, and end-stage renal disease on hemodialysis.  Patient was sent to the emergency department from her dialysis clinic after being found unresponsive during treatment, believed to have cardiac arrest and received 2-minutes of CPR.  Patient was alert and talking on ED arrival.  Patient has no recollection of the event.  Does not remember participating in dialysis but does not remember how it ended.  Labs on ED arrival include sodium 137, potassium 3.3, glucose 121, creatinine 2.94 with GFR 18, calcium 8.0, troponin 20, and hemoglobin 8.7.  CT head negative for acute changes and carotid ultrasound shows mild stenosis in left carotid.  Echo completed today shows EF 55 to 60%  We have been consulted to manage dialysis needs during this admission.   Objective:  Vital signs in last 24 hours:  Temp:  [97.7 F (36.5 C)-98.6 F (37 C)] 98.6 F (37 C) (08/17 1049) Pulse Rate:  [53-77] 61 (08/17 1234) Resp:  [9-20] 16 (08/17 1234) BP: (105-152)/(58-88) 149/81 (08/17 1234) SpO2:  [95 %-100 %] 95 % (08/17 1234) Weight:  [65.8 kg] 65.8 kg (08/16 2336)  Weight change:  Filed Weights   12/09/21 2336  Weight: 65.8 kg    Intake/Output: I/O last 3 completed shifts: In: 1025 [I.V.:1025] Out: -    Intake/Output this shift:  Total I/O In: 1017 [I.V.:1017] Out: -   Physical Exam: General: NAD  Head: Normocephalic, atraumatic. Moist oral mucosal membranes  Eyes: Anicteric  Lungs:  Clear to auscultation, normal effort  Heart: Regular rate and rhythm  Abdomen:  Soft, nontender  Extremities:  trace peripheral edema.  Neurologic: Nonfocal, moving all four extremities  Skin: No lesions  Access: Rt Permcath    Basic Metabolic Panel: Recent Labs  Lab 12/09/21 1548 12/09/21 2329 12/10/21 0527   NA 137  --  138  K 3.3*  --  3.4*  CL 101  --  104  CO2 25  --  27  GLUCOSE 121*  --  83  BUN 16  --  25*  CREATININE 2.94*  --  3.75*  CALCIUM 8.0*  --  8.3*  MG  --  1.9  --     Liver Function Tests: No results for input(s): "AST", "ALT", "ALKPHOS", "BILITOT", "PROT", "ALBUMIN" in the last 168 hours. No results for input(s): "LIPASE", "AMYLASE" in the last 168 hours. No results for input(s): "AMMONIA" in the last 168 hours.  CBC: Recent Labs  Lab 12/09/21 1548 12/10/21 0527  WBC 4.4 6.3  NEUTROABS 1.3*  --   HGB 8.7* 8.0*  HCT 28.0* 25.6*  MCV 94.6 95.5  PLT 184 196    Cardiac Enzymes: No results for input(s): "CKTOTAL", "CKMB", "CKMBINDEX", "TROPONINI" in the last 168 hours.  BNP: Invalid input(s): "POCBNP"  CBG: Recent Labs  Lab 12/10/21 0746 12/10/21 1119  GLUCAP 75 112*    Microbiology: Results for orders placed or performed during the hospital encounter of 11/19/20  Blood culture (routine x 2)     Status: None   Collection Time: 11/19/20  4:47 PM   Specimen: BLOOD  Result Value Ref Range Status   Specimen Description BLOOD RIGHT ANTECUBITAL  Final   Special Requests   Final    BOTTLES DRAWN AEROBIC AND ANAEROBIC Blood Culture adequate volume   Culture   Final    NO GROWTH 5 DAYS  Performed at [facility], [address] [place], [place] [address]    Report Status 11/24/2020 FINAL  Final  Resp Panel by RT-PCR (Flu A&B, Covid) Nasopharyngeal Swab     Status: None   Collection Time: 11/19/20  4:48 PM   Specimen: Nasopharyngeal Swab; Nasopharyngeal(NP) swabs in vial transport medium  Result Value Ref Range Status   SARS Coronavirus 2 by RT PCR NEGATIVE NEGATIVE Final    Comment: (NOTE) SARS-CoV-2 target nucleic acids are NOT DETECTED.  The SARS-CoV-2 RNA is generally detectable in upper respiratory specimens during the acute phase of infection. The lowest concentration of SARS-CoV-2 viral copies this assay can detect is 138  copies/mL. A negative result does not preclude SARS-Cov-2 infection and should not be used as the sole basis for treatment or other patient management decisions. A negative result may occur with  improper specimen collection/handling, submission of specimen other than nasopharyngeal swab, presence of viral mutation(s) within the areas targeted by this assay, and inadequate number of viral copies(<138 copies/mL). A negative result must be combined with clinical observations, patient history, and epidemiological information. The expected result is Negative.  Fact Sheet for Patients:  EntrepreneurPulse.com.au  Fact Sheet for Healthcare Providers:  IncredibleEmployment.be  This test is no t yet approved or cleared by the [place] FDA and  has been authorized for detection and/or diagnosis of SARS-CoV-2 by FDA under an Emergency Use Authorization (EUA). This EUA will remain  in effect (meaning this test can be used) for the duration of the COVID-19 declaration under Section 564(b)(1) of the Act, 21 U.S.C.section 360bbb-3(b)(1), unless the authorization is terminated  or revoked sooner.       Influenza A by PCR NEGATIVE NEGATIVE Final   Influenza B by PCR NEGATIVE NEGATIVE Final    Comment: (NOTE) The Xpert Xpress SARS-CoV-2/FLU/RSV plus assay is intended as an aid in the diagnosis of influenza from Nasopharyngeal swab specimens and should not be used as a sole basis for treatment. Nasal washings and aspirates are unacceptable for Xpert Xpress SARS-CoV-2/FLU/RSV testing.  Fact Sheet for Patients: EntrepreneurPulse.com.au  Fact Sheet for Healthcare Providers: IncredibleEmployment.be  This test is not yet approved or cleared by the [place] FDA and has been authorized for detection and/or diagnosis of SARS-CoV-2 by FDA under an Emergency Use Authorization (EUA). This EUA will remain in effect (meaning  this test can be used) for the duration of the COVID-19 declaration under Section 564(b)(1) of the Act, 21 U.S.C. section 360bbb-3(b)(1), unless the authorization is terminated or revoked.  Performed at [facility], [address] [place], [place] [address]   Blood culture (routine x 2)     Status: None   Collection Time: 11/19/20  6:46 PM   Specimen: BLOOD  Result Value Ref Range Status   Specimen Description BLOOD RIGHT ANTECUBITAL  Final   Special Requests   Final    BOTTLES DRAWN AEROBIC AND ANAEROBIC Blood Culture adequate volume   Culture   Final    NO GROWTH 5 DAYS Performed at [facility], [address], [place],  [address]    Report Status 11/24/2020 FINAL  Final  Urine Culture     Status: Abnormal   Collection Time: 11/19/20  9:33 PM   Specimen: Urine, Clean Catch  Result Value Ref Range Status   Specimen Description   Final    URINE, CLEAN CATCH Performed at [facility], [address] [place],  [address]    Special Requests  Final    NONE Performed at [facility], [address] [place], [place] [address]    Culture (A)  Final    <10,000 COLONIES/mL INSIGNIFICANT GROWTH Performed at [facility] [address], [place], [place] [address]    Report Status 11/21/2020 FINAL  Final  Culture, fungus without smear     Status: None   Collection Time: 11/20/20 11:09 AM   Specimen: CSF; Cerebrospinal Fluid  Result Value Ref Range Status   Specimen Description   Final    CSF Performed at [facility], [address] [place], [place] [address]    Special Requests   Final    NONE Performed at [facility], [address] [place], [place] [address]    Culture   Final    NO FUNGUS ISOLATED AFTER 21 DAYS Performed at [facility], [place] [address] [place], [place] [address]    Report Status 12/11/2020 FINAL  Final  CSF culture w Gram Stain     Status: None   Collection  Time: 11/20/20 11:09 AM   Specimen: PATH Cytology CSF; Cerebrospinal Fluid  Result Value Ref Range Status   Specimen Description   Final    CSF Performed at [facility], [address] [place], [place] [address]    Special Requests   Final    NONE Performed at [facility], [address] [place], [place] [address]    Gram Stain   Final    NO ORGANISMS SEEN WBC SEEN RED BLOOD CELLS PRESENT Performed at [facility], [address] [place], [place] [address]    Culture   Final    NO GROWTH 3 DAYS Performed at [facility], [place] [address] [place], [place] [address]    Report Status 11/23/2020 FINAL  Final  Fungus Culture With Stain     Status: None   Collection Time: 11/20/20 11:09 AM   Specimen: PATH Cytology CSF; Cerebrospinal Fluid  Result Value Ref Range Status   Fungus Stain Final report  Final   Fungus (Mycology) Culture Final report  Final    Comment: (NOTE) Performed At: [facility] [place], [place] [id number] Rush Farmer MD IW:[id number]    Fungal Source CSF  Final    Comment: Performed at [facility], [address] [place], [place] [address]  Fungus Culture Result     Status: None   Collection Time: 11/20/20 11:09 AM  Result Value Ref Range Status   Result 1 Comment  Final    Comment: (NOTE) KOH/Calcofluor preparation:  no fungus observed. Performed At: [facility] [place], [place] [id number] Rush Farmer MD JS:[id number]   Fungal organism reflex     Status: None   Collection Time: 11/20/20 11:09 AM  Result Value Ref Range Status   Fungal result 1 Comment  Final    Comment: (NOTE) No yeast or mold isolated after 4 weeks. Performed At: [facility] [place], [place] [id number] Rush Farmer MD IN:[id number]   Resp Panel by RT-PCR (Flu A&B, Covid) Nasopharyngeal Swab     Status: None   Collection Time: 11/27/20  1:39 PM    Specimen: Nasopharyngeal Swab; Nasopharyngeal(NP) swabs in vial transport medium  Result Value Ref Range Status   SARS Coronavirus 2 by RT PCR NEGATIVE NEGATIVE Final    Comment: (NOTE) SARS-CoV-2 target nucleic acids are NOT DETECTED.  The SARS-CoV-2 RNA is generally detectable in  upper respiratory specimens during the acute phase of infection. The lowest concentration of SARS-CoV-2 viral copies this assay can detect is 138 copies/mL. A negative result does not preclude SARS-Cov-2 infection and should not be used as the sole basis for treatment or other patient management decisions. A negative result may occur with  improper specimen collection/handling, submission of specimen other than nasopharyngeal swab, presence of viral mutation(s) within the areas targeted by this assay, and inadequate number of viral copies(<138 copies/mL). A negative result must be combined with clinical observations, patient history, and epidemiological information. The expected result is Negative.  Fact Sheet for Patients:  EntrepreneurPulse.com.au  Fact Sheet for Healthcare Providers:  IncredibleEmployment.be  This test is no t yet approved or cleared by the [place] FDA and  has been authorized for detection and/or diagnosis of SARS-CoV-2 by FDA under an Emergency Use Authorization (EUA). This EUA will remain  in effect (meaning this test can be used) for the duration of the COVID-19 declaration under Section 564(b)(1) of the Act, 21 U.S.C.section 360bbb-3(b)(1), unless the authorization is terminated  or revoked sooner.       Influenza A by PCR NEGATIVE NEGATIVE Final   Influenza B by PCR NEGATIVE NEGATIVE Final    Comment: (NOTE) The Xpert Xpress SARS-CoV-2/FLU/RSV plus assay is intended as an aid in the diagnosis of influenza from Nasopharyngeal swab specimens and should not be used as a sole basis for treatment. Nasal washings and aspirates are  unacceptable for Xpert Xpress SARS-CoV-2/FLU/RSV testing.  Fact Sheet for Patients: EntrepreneurPulse.com.au  Fact Sheet for Healthcare Providers: IncredibleEmployment.be  This test is not yet approved or cleared by the [place] FDA and has been authorized for detection and/or diagnosis of SARS-CoV-2 by FDA under an Emergency Use Authorization (EUA). This EUA will remain in effect (meaning this test can be used) for the duration of the COVID-19 declaration under Section 564(b)(1) of the Act, 21 U.S.C. section 360bbb-3(b)(1), unless the authorization is terminated or revoked.  Performed at [facility], [address] [place], [place] [address]   Urine Culture     Status: Abnormal   Collection Time: 11/27/20  2:15 PM   Specimen: Kidney; Urine  Result Value Ref Range Status   Specimen Description   Final    URINE, CLEAN CATCH Performed at [facility], [address], [place], [place] [address]    Special Requests   Final    NONE Performed at [facility], [place], [place] [address]    Culture 20,000 COLONIES/mL ENTEROBACTER AEROGENES (A)  Final   Report Status 11/30/2020 FINAL  Final   Organism ID, Bacteria ENTEROBACTER AEROGENES (A)  Final      Susceptibility   Enterobacter aerogenes - MIC*    CEFAZOLIN >=64 RESISTANT Resistant     CEFEPIME 1 SENSITIVE Sensitive     CEFTRIAXONE >=64 RESISTANT Resistant     CIPROFLOXACIN <=0.25 SENSITIVE Sensitive     GENTAMICIN <=1 SENSITIVE Sensitive     IMIPENEM <=0.25 SENSITIVE Sensitive     NITROFURANTOIN 64 INTERMEDIATE Intermediate     TRIMETH/SULFA <=20 SENSITIVE Sensitive     PIP/TAZO >=128 RESISTANT Resistant     * 20,000 COLONIES/mL ENTEROBACTER AEROGENES  CULTURE, BLOOD (ROUTINE X 2) w Reflex to ID Panel     Status: None   Collection Time: 11/27/20  8:23 PM   Specimen: BLOOD  Result Value Ref Range Status   Specimen Description BLOOD  LAC  Final   Special Requests  Final    BOTTLES DRAWN AEROBIC AND ANAEROBIC Blood Culture results may not be optimal due to an inadequate volume of blood received in culture bottles   Culture   Final    NO GROWTH 5 DAYS Performed at [facility], [address] [place], [place] [address]    Report Status 12/02/2020 FINAL  Final  CULTURE, BLOOD (ROUTINE X 2) w Reflex to ID Panel     Status: None   Collection Time: 11/27/20  9:32 PM   Specimen: BLOOD LEFT HAND  Result Value Ref Range Status   Specimen Description BLOOD LEFT HAND  Final   Special Requests   Final    BOTTLES DRAWN AEROBIC AND ANAEROBIC Blood Culture adequate volume   Culture   Final    NO GROWTH 5 DAYS Performed at [facility], [address] [place], [place] [address]    Report Status 12/02/2020 FINAL  Final  CULTURE, BLOOD (ROUTINE X 2) w Reflex to ID Panel     Status: None   Collection Time: 12/06/20 10:35 AM   Specimen: BLOOD  Result Value Ref Range Status   Specimen Description BLOOD LT KNUCKLE  Final   Special Requests   Final    BOTTLES DRAWN AEROBIC AND ANAEROBIC Blood Culture adequate volume   Culture   Final    NO GROWTH 5 DAYS Performed at [facility], [address] [place], [place] [address]    Report Status 12/11/2020 FINAL  Final  CULTURE, BLOOD (ROUTINE X 2) w Reflex to ID Panel     Status: None   Collection Time: 12/06/20 10:35 AM   Specimen: BLOOD  Result Value Ref Range Status   Specimen Description BLOOD BLOOD LEFT WRIST  Final   Special Requests AEROBIC BOTTLE ONLY Blood Culture adequate volume  Final   Culture   Final    NO GROWTH 5 DAYS Performed at [facility], [address] [place], [place] [address]    Report Status 12/11/2020 FINAL  Final  Culture, blood (single) w Reflex to ID Panel     Status: None   Collection Time: 12/14/20  2:36 PM   Specimen: BLOOD  Result Value Ref Range Status   Specimen Description BLOOD BLOOD RIGHT HAND   Final   Special Requests   Final    BOTTLES DRAWN AEROBIC AND ANAEROBIC Blood Culture adequate volume   Culture   Final    NO GROWTH 5 DAYS Performed at [facility], [address], [place],  [address]    Report Status 12/19/2020 FINAL  Final  Culture, blood (single) w Reflex to ID Panel     Status: None   Collection Time: 12/14/20  3:38 PM   Specimen: BLOOD  Result Value Ref Range Status   Specimen Description BLOOD A-LINE DRAW  Final   Special Requests   Final    BOTTLES DRAWN AEROBIC AND ANAEROBIC Blood Culture adequate volume   Culture   Final    NO GROWTH 5 DAYS Performed at [facility], [address] [place],  [address]    Report Status 12/19/2020 FINAL  Final  CULTURE, BLOOD (ROUTINE X 2) w Reflex to ID Panel     Status: None   Collection Time: 12/18/20 11:44 PM   Specimen: BLOOD LEFT WRIST  Result Value Ref Range Status   Specimen Description BLOOD LEFT WRIST  Final   Special Requests   Final    BOTTLES DRAWN AEROBIC AND ANAEROBIC Blood  Culture adequate volume   Culture   Final    NO GROWTH 5 DAYS Performed at [facility], [address] [place], [place] [address]    Report Status 12/23/2020 FINAL  Final  CULTURE, BLOOD (ROUTINE X 2) w Reflex to ID Panel     Status: None   Collection Time: 12/18/20 11:51 PM   Specimen: BLOOD LEFT HAND  Result Value Ref Range Status   Specimen Description BLOOD LEFT HAND  Final   Special Requests   Final    BOTTLES DRAWN AEROBIC AND ANAEROBIC Blood Culture adequate volume   Culture   Final    NO GROWTH 5 DAYS Performed at [facility], [address], [place], [place] [address]    Report Status 12/23/2020 FINAL  Final    Coagulation Studies: Recent Labs    12/10/21 0130  LABPROT 14.0  INR 1.1    Urinalysis: No results for input(s): "COLORURINE", "LABSPEC", "PHURINE", "GLUCOSEU", "HGBUR", "BILIRUBINUR", "KETONESUR", "PROTEINUR", "UROBILINOGEN", "NITRITE",  "LEUKOCYTESUR" in the last 72 hours.  Invalid input(s): "APPERANCEUR"    Imaging: ECHOCARDIOGRAM COMPLETE  Result Date: 12/10/2021    ECHOCARDIOGRAM REPORT   Patient Name:   Tonya Myers Date of Exam: 12/10/2021 Medical Rec #:  [id number]       Height:       72.0 in Accession #:    [id number]      Weight:       145.0 lb Date of Birth:  1966/01/24       BSA:          1.858 m Patient Age:    27 years        BP:           144/65 mmHg Patient Gender: F               HR:           71 bpm. Exam Location:  [facility] Procedure: 2D Echo, Cardiac Doppler and Color Doppler Indications:     Syncope R55  History:         Patient has prior history of Echocardiogram examinations, most                  recent 12/05/2020. Risk Factors:Diabetes and Hypertension.  Sonographer:     Sherrie Sport Referring Phys:  [id number] [place] Diagnosing Phys: Isaias Cowman MD  Sonographer Comments: Suboptimal apical window. IMPRESSIONS  1. Left ventricular ejection fraction, by estimation, is 55 to 60%. The left ventricle has normal function. The left ventricle has no regional wall motion abnormalities. Left ventricular diastolic parameters were normal.  2. Right ventricular systolic function is normal. The right ventricular size is normal.  3. The mitral valve is normal in structure. Trivial mitral valve regurgitation. No evidence of mitral stenosis.  4. The aortic valve is normal in structure. Aortic valve regurgitation is not visualized. No aortic stenosis is present.  5. The inferior vena cava is normal in size with greater than 50% respiratory variability, suggesting right atrial pressure of 3 mmHg. FINDINGS  Left Ventricle: Left ventricular ejection fraction, by estimation, is 55 to 60%. The left ventricle has normal function. The left ventricle has no regional wall motion abnormalities. The left ventricular internal cavity size was normal in size. There is  no left ventricular hypertrophy. Left ventricular diastolic parameters were  normal. Right Ventricle: The right ventricular size is normal. No increase in right ventricular wall thickness. Right ventricular systolic function is normal. Left Atrium:  Left atrial size was normal in size. Right Atrium: Right atrial size was normal in size. Pericardium: There is no evidence of pericardial effusion. Mitral Valve: The mitral valve is normal in structure. Trivial mitral valve regurgitation. No evidence of mitral valve stenosis. Tricuspid Valve: The tricuspid valve is normal in structure. Tricuspid valve regurgitation is trivial. No evidence of tricuspid stenosis. Aortic Valve: The aortic valve is normal in structure. Aortic valve regurgitation is not visualized. No aortic stenosis is present. Aortic valve mean gradient measures 3.0 mmHg. Aortic valve peak gradient measures 5.0 mmHg. Aortic valve area, by VTI measures 3.79 cm. Pulmonic Valve: The pulmonic valve was normal in structure. Pulmonic valve regurgitation is not visualized. No evidence of pulmonic stenosis. Aorta: The aortic root is normal in size and structure. Venous: The inferior vena cava is normal in size with greater than 50% respiratory variability, suggesting right atrial pressure of 3 mmHg. IAS/Shunts: No atrial level shunt detected by color flow Doppler.  LEFT VENTRICLE PLAX 2D LVIDd:         4.20 cm   Diastology LVIDs:         3.00 cm   LV e' medial:    4.46 cm/s LV PW:         1.20 cm   LV E/e' medial:  14.4 LV IVS:        1.20 cm   LV e' lateral:   6.96 cm/s LVOT diam:     2.00 cm   LV E/e' lateral: 9.2 LV SV:         74 LV SV Index:   40 LVOT Area:     3.14 cm  RIGHT VENTRICLE RV Basal diam:  3.00 cm RV S prime:     17.50 cm/s TAPSE (M-mode): 4.3 cm LEFT ATRIUM             Index        RIGHT ATRIUM           Index LA diam:        3.50 cm 1.88 cm/m   RA Area:     21.00 cm LA Vol (A2C):   56.3 ml 30.29 ml/m  RA Volume:   55.00 ml  29.60 ml/m LA Vol (A4C):   41.8 ml 22.49 ml/m LA Biplane Vol: 51.9 ml 27.93 ml/m  AORTIC  VALVE AV Area (Vmax):    3.31 cm AV Area (Vmean):   3.27 cm AV Area (VTI):     3.79 cm AV Vmax:           112.00 cm/s AV Vmean:          80.200 cm/s AV VTI:            0.196 m AV Peak Grad:      5.0 mmHg AV Mean Grad:      3.0 mmHg LVOT Vmax:         118.00 cm/s LVOT Vmean:        83.400 cm/s LVOT VTI:          0.236 m LVOT/AV VTI ratio: 1.21  AORTA Ao Root diam: 3.30 cm MITRAL VALVE               TRICUSPID VALVE MV Area (PHT): 2.03 cm    TR Peak grad:   16.3 mmHg MV Decel Time: 373 msec    TR Vmax:        202.00 cm/s MV E velocity: 64.30 cm/s MV A velocity: 78.80 cm/s  SHUNTS  MV E/A ratio:  0.82        Systemic VTI:  0.24 m                            Systemic Diam: 2.00 cm Isaias Cowman MD Electronically signed by Isaias Cowman MD Signature Date/Time: 12/10/2021/1:15:32 PM    Final    US Carotid Bilateral  Result Date: 12/09/2021 CLINICAL DATA:  Recent syncopal episode EXAM: BILATERAL CAROTID DUPLEX ULTRASOUND TECHNIQUE: Pearline Cables scale imaging, color Doppler and duplex ultrasound were performed of bilateral carotid and vertebral arteries in the neck. COMPARISON:  None Available. FINDINGS: Criteria: Quantification of carotid stenosis is based on velocity parameters that correlate the residual internal carotid diameter with NASCET-based stenosis levels, using the diameter of the distal internal carotid lumen as the denominator for stenosis measurement. The following velocity measurements were obtained: RIGHT ICA: 84/16 cm/sec CCA: 17/61 cm/sec SYSTOLIC ICA/CCA RATIO:  1.0 ECA: 90 cm/sec LEFT ICA: 139/31 cm/sec CCA: 607/37 cm/sec SYSTOLIC ICA/CCA RATIO:  1.3 ECA: 79 cm/sec RIGHT CAROTID ARTERY: Preliminary grayscale images demonstrate no significant atherosclerotic plaque. Waveforms, velocities and flow velocity ratios show no evidence of focal hemodynamically significant stenosis. RIGHT VERTEBRAL ARTERY:  Antegrade in nature. LEFT CAROTID ARTERY: Limited grayscale images demonstrate no significant  atherosclerotic plaque. Waveforms, velocities and flow velocity ratios suggest a mild stenosis in the 50-69% range in the lower end of that spectrum. LEFT VERTEBRAL ARTERY:  Antegrade in nature. IMPRESSION: No focal hemodynamically significant stenosis in the right carotid system. Velocities suggest a mild degree of stenosis in the left carotid artery in the lower end of the 50-69% range. Electronically Signed   By: Inez Catalina M.D.   On: 12/09/2021 20:36   CT Head Wo Contrast  Result Date: 12/09/2021 CLINICAL DATA:  Syncope/presyncope, cerebrovascular cause suspected EXAM: CT HEAD WITHOUT CONTRAST TECHNIQUE: Contiguous axial images were obtained from the base of the skull through the vertex without intravenous contrast. RADIATION DOSE REDUCTION: This exam was performed according to the departmental dose-optimization program which includes automated exposure control, adjustment of the mA and/or kV according to patient size and/or use of iterative reconstruction technique. COMPARISON:  July 2022 FINDINGS: Brain: There is no acute intracranial hemorrhage, mass effect, or edema. Gray-white differentiation is preserved. There is no extra-axial fluid collection. Ventricles and sulci are stable in size and configuration. Vascular: There is mild atherosclerotic calcification at the skull base. Skull: Calvarium is unremarkable. Sinuses/Orbits: No acute finding. Other: None. IMPRESSION: No acute intracranial abnormality. Electronically Signed   By: Macy Mis M.D.   On: 12/09/2021 16:40     Medications:    heparin 900 Units/hr (12/10/21 1212)    amLODipine  5 mg Oral Daily   [START ON 12/11/2021] aspirin EC  81 mg Oral Daily   [START ON 12/11/2021] Chlorhexidine Gluconate Cloth  6 each Topical Q0600   insulin aspart  0-15 Units Subcutaneous TID AC & HS   [START ON 12/11/2021] levothyroxine  125 mcg Oral Q0600   montelukast  10 mg Oral QHS   pantoprazole  40 mg Oral Daily   potassium chloride  20 mEq Oral  Once   rosuvastatin  10 mg Oral QHS   acetaminophen **OR** acetaminophen, albuterol, fluticasone, magnesium hydroxide  Assessment/ Plan:  Ms. Yamile Roedl is a 56 y.o.  female with past medical history including depression, type 2 diabetes, asthma, hypertension, hypothyroidism, CLL, and end-stage renal disease on hemodialysis.  Hypokalemia with end-stage renal disease on  hemodialysis.  Patient received the majority of treatment yesterday prior to ED arrival.  We will schedule next treatment for tomorrow..  2. Anemia of chronic kidney disease Lab Results  Component Value Date   HGB 8.0 (L) 12/10/2021    Hemoglobin below desired target.  We will continue to monitor.  Patient receives [place] outpatient.  3. Secondary Hyperparathyroidism: with outpatient labs: PTH 235, phosphorus 6.7, calcium 9.0 on 11/30/2021.   Lab Results  Component Value Date   CALCIUM 8.3 (L) 12/10/2021   PHOS 3.2 11/28/2020    Calcium and phosphorus within acceptable range.  We will continue to monitor.  4. 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 Home regimen includes lispro. Most recent hemoglobin A1c is 4.9 on 12/09/2021.   5.  Syncopal episode during dialysis treatment.  Cardiology consulted due to elevated troponins overnight.  Echo shows EF 55 to 60%.  Cardiac cath was considered however history in favor of vagal episode.  Suggestive of [employer].   LOS: 0   8/17/20232:33 PM

## 2021-12-10 NOTE — Progress Notes (Signed)
*  PRELIMINARY RESULTS* Echocardiogram 2D Echocardiogram has been performed.  Sherrie Sport 12/10/2021, 9:34 AM

## 2021-12-11 ENCOUNTER — Observation Stay: Payer: Medicare Other

## 2021-12-11 DIAGNOSIS — R778 Other specified abnormalities of plasma proteins: Secondary | ICD-10-CM | POA: Diagnosis not present

## 2021-12-11 DIAGNOSIS — E785 Hyperlipidemia, unspecified: Secondary | ICD-10-CM | POA: Diagnosis not present

## 2021-12-11 DIAGNOSIS — L8990 Pressure ulcer of unspecified site, unspecified stage: Secondary | ICD-10-CM | POA: Insufficient documentation

## 2021-12-11 DIAGNOSIS — R55 Syncope and collapse: Secondary | ICD-10-CM | POA: Diagnosis not present

## 2021-12-11 DIAGNOSIS — N186 End stage renal disease: Secondary | ICD-10-CM | POA: Diagnosis not present

## 2021-12-11 LAB — BASIC METABOLIC PANEL
Anion gap: 9 (ref 5–15)
BUN: 39 mg/dL — ABNORMAL HIGH (ref 6–20)
CO2: 24 mmol/L (ref 22–32)
Calcium: 9 mg/dL (ref 8.9–10.3)
Chloride: 106 mmol/L (ref 98–111)
Creatinine, Ser: 5.69 mg/dL — ABNORMAL HIGH (ref 0.44–1.00)
GFR, Estimated: 8 mL/min — ABNORMAL LOW (ref >60)
Glucose, Bld: 80 mg/dL (ref 70–99)
Potassium: 3.9 mmol/L (ref 3.5–5.1)
Sodium: 139 mmol/L (ref 135–145)

## 2021-12-11 LAB — CBC
HCT: 24.8 % — ABNORMAL LOW (ref 36.0–46.0)
Hemoglobin: 7.9 g/dL — ABNORMAL LOW (ref 12.0–15.0)
MCH: 30.4 pg (ref 26.0–34.0)
MCHC: 31.9 g/dL (ref 30.0–36.0)
MCV: 95.4 fL (ref 80.0–100.0)
Platelets: 182 10*3/uL (ref 150–400)
RBC: 2.6 MIL/uL — ABNORMAL LOW (ref 3.87–5.11)
RDW: 17.9 % — ABNORMAL HIGH (ref 11.5–15.5)
WBC: 3.5 10*3/uL — ABNORMAL LOW (ref 4.0–10.5)
nRBC: 0 % (ref 0.0–0.2)

## 2021-12-11 LAB — HEPARIN LEVEL (UNFRACTIONATED): Heparin Unfractionated: 0.19 IU/mL — ABNORMAL LOW (ref 0.30–0.70)

## 2021-12-11 LAB — GLUCOSE, CAPILLARY
Glucose-Capillary: 82 mg/dL (ref 70–99)
Glucose-Capillary: 172 mg/dL — ABNORMAL HIGH (ref 70–99)

## 2021-12-11 LAB — HEPATITIS B SURFACE ANTIBODY, QUANTITATIVE: Hep B S AB Quant (Post): 22.8 m[IU]/mL (ref >9.9)

## 2021-12-11 MED ORDER — HEPARIN BOLUS VIA INFUSION
2000.0000 [IU] | Freq: Once | INTRAVENOUS | Status: AC
Start: 2021-12-11 — End: 2021-12-11
  Administered 2021-12-11: 2000 [IU] via INTRAVENOUS
  Filled 2021-12-11: qty 2000

## 2021-12-11 MED ORDER — AMLODIPINE BESYLATE 5 MG PO TABS: 5.0000 mg | ORAL_TABLET | Freq: Every day | ORAL | Status: DC

## 2021-12-11 MED ORDER — CIPROFLOXACIN HCL 250 MG PO TABS: 250.0000 mg | ORAL_TABLET | Freq: Every day | ORAL | 0 refills | Status: DC

## 2021-12-11 MED ORDER — CIPROFLOXACIN HCL 500 MG PO TABS
250.0000 mg | ORAL_TABLET | Freq: Every day | ORAL | Status: DC
  Administered 2021-12-11: 250 mg via ORAL
  Filled 2021-12-11: qty 0.5

## 2021-12-11 NOTE — Care Management Obs Status (Signed)
[facility] NOTIFICATION   Patient Details  Name: Keelie Zemanek MRN: [id number] Date of Birth: Apr 26, 1966   Medicare Observation Status Notification Given:  Yes    Candie Chroman, [name] 12/11/2021, 12:05 PM

## 2021-12-11 NOTE — Discharge Summary (Signed)
Tonya Myers IRJ:[id number] DOB: 1965-11-25 DOA: 12/09/2021  PCP: [name], MD  Admit date: 12/09/2021 Discharge date: 12/11/2021  Admitted From: home Disposition:  home  Recommendations for Outpatient Follow-up:  Follow up with PCP in 1 week Please obtain BMP/CBC in one week Please follow up with cardiology as scheduled      Discharge Condition:Stable CODE STATUS:full  Diet recommendation: renal    Brief/Interim Summary: Per TKZ:SWFUXNA Tonya Myers is a 56 y.o. African American female with medical history significant for ESRD on HD, anxiety, asthma, CLL, depression, type 2 diabetes mellitus, hypertension and hypothyroidism, who presented to the ER with acute onset of syncope while at hemodialysis today.  The patient apparently had half of his HD session.  He was thought to have cardiac arrest and had 2 minutes of CPR however by arrival of EMS he was conversant at his baseline.  No chest pain or palpitations though she had soreness after CPR.  No nausea or vomiting or abdominal pain.  No cough or wheezing or dyspnea.   ED Course: Upon presentation to the emergency room, vital signs were within normal.   Labs revealed hypokalemia of 3.3 with a creatinine of 2.94 and calcium 8.  High sensitive troponin I was 20 and CBC showed anemia with hemoglobin of 8.7 and hematocrit of 28 compared to 11.5 and 33.2 on 12/26/2020. EKG as reviewed by me : EKG showed normal sinus rhythm with a rate of 59 with left anterior fascicular block and low voltage QRS and prolonged QT interval with QTc of 512 MS. Imaging: Noncontrasted head CT scan revealed no acute ventricular abnormalities.  Patient was admitted to the hospital.  Nephrology and cardiology were consulted.  Her troponins were elevated and she was started on heparin drip.  After discussion with cardiology patient opted not to do cardiac cath or stress test.  She would like to follow-up as outpatient for this.  Cardiology placed monitor on her to rule  out arrhythmia as cause of syncope.  She had dialysis today.  She was cleared for discharged by cardiology and nephrology.    Syncope Elevated troponin - The patient was thought to have cardiac arrest without respiratory arrest and had 2 minutes of CPR. 8/17 was started on IV fluids Cardiology consulted input appreciated CT negative for acute changes carotid ultrasound mild stenosis of left carotid Echo normal EF no wall motion abnormalities Troponin elevated started on heparin drip which was discontinued Cardiology suspect syncopal episode was vasovagal/hypotensive in nature and troponin elevation is demand ischemia in that setting.  Also with CPR this may have raised CK and troponin Continue 81 mg aspirin and statin Patient opted not to do cardiac cath.  Also decided not to pursue [facility] as inpatient.  She prefers pursuing a conservative strategy from cardiac standpoint and prefers close follow-up with Dr. Saralyn Pilar on as outpatient basis.  She had a 30-day event monitor placed to rule out arrhythmias.   Hypokalemia  Was replaced     End-stage renal disease on hemodialysis [facility]) Nephrology was following Had dialysis today       Type 2 diabetes mellitus with peripheral neuropathy (HCC) Continue R-ISS Continue Neurontin     Hypothyroidism On Synthroid.   GERD without esophagitis On PPI therapy   Dyslipidemia On statin therapy.      Discharge Diagnoses:  Principal Problem:   Syncope Active Problems:   Hypokalemia   Type 2 diabetes mellitus with peripheral neuropathy (HCC)   End-stage renal disease on hemodialysis ([place])  Hypothyroidism   Dyslipidemia   GERD without esophagitis   Elevated troponin   Pressure injury of skin    Discharge Instructions  Discharge Instructions     Call MD for:  temperature >100.4   Complete by: As directed    Diet - low sodium heart healthy   Complete by: As directed    Discharge wound care:   Complete by: As  directed    As above   Increase activity slowly   Complete by: As directed       Allergies as of 12/11/2021       Reactions   Amoxapine    Penicillins    Ceftazidime    Other reaction(s): Confusion, Hallucination, Unknown Encephalopathy - improved after dialysis/some concern for cephalosporin neurotoxicity Tolerated without confusion 06/2021. Encephalopathy - improved after dialysis/some concern for cephalosporin neurotoxicity Tolerated without confusion 06/2021.        Medication List     STOP taking these medications    DULoxetine 30 MG capsule Commonly known as: CYMBALTA   losartan 50 MG tablet Commonly known as: COZAAR   rosuvastatin 10 MG tablet Commonly known as: CRESTOR       TAKE these medications    acetaminophen 325 MG tablet Commonly known as: TYLENOL Take 2 tablets (650 mg total) by mouth every 6 (six) hours as needed for mild pain (or Fever >/= 101).   albuterol 108 (90 Base) MCG/ACT inhaler Commonly known as: VENTOLIN HFA Inhale 2 puffs into the lungs every 6 (six) hours as needed for wheezing or shortness of breath.   amLODipine 5 MG tablet Commonly known as: NORVASC Take 1 tablet (5 mg total) by mouth daily.   aspirin EC 81 MG tablet Take 1 tablet (81 mg total) by mouth daily. Swallow whole.   atorvastatin 20 MG tablet Commonly known as: LIPITOR Take 20 mg by mouth daily.   ciprofloxacin 250 MG tablet Commonly known as: CIPRO Take 1 tablet (250 mg total) by mouth daily with breakfast for 5 doses. Start taking on: December 12, 2021   Dexcom G6 Sensor Misc SMARTSIG:1 Each Topical Every 10 Days   diclofenac Sodium 1 % Gel Commonly known as: VOLTAREN Apply 2 g topically 4 (four) times daily.   fluticasone 50 MCG/ACT nasal spray Commonly known as: FLONASE 1 spray 2 (two) times daily.   insulin lispro 100 UNIT/ML KwikPen Commonly known as: HUMALOG CBG < 70: Take glucose/juice/candy as directed CBG 70 - 120: 0 units CBG 121 - 150: 2  units CBG 151 - 200: 3 units CBG 201 - 250: 5 units CBG 251 - 300: 8 units CBG 301 - 350: 11 units CBG 351 - 400: 15 units CBG > 400: 15 units and recheck in 1 hour - if still >400 repeat 15 units and call PCP   levothyroxine 125 MCG tablet Commonly known as: SYNTHROID Take 125 mcg by mouth daily.   sevelamer carbonate 800 MG tablet Commonly known as: RENVELA Take 800 mg by mouth 3 (three) times daily.               Discharge Care Instructions  (From admission, onward)           Start     Ordered   12/11/21 0000  Discharge wound care:       Comments: As above   12/11/21 1139            Follow-up Information     Paraschos, Sheppard Coil, MD. Go in 2 week(s).  Specialty: Cardiology Contact information: [facility] [place] [address] [phone]         [name], MD Follow up in 1 week(s).   Specialty: Family Medicine Contact information: [address] [place] [address] [phone]                Allergies  Allergen Reactions   Amoxapine    Penicillins    Ceftazidime     Other reaction(s): Confusion, Hallucination, Unknown Encephalopathy - improved after dialysis/some concern for cephalosporin neurotoxicity Tolerated without confusion 06/2021. Encephalopathy - improved after dialysis/some concern for cephalosporin neurotoxicity Tolerated without confusion 06/2021.     Consultations: Cardiology and nephrology   Procedures/Studies: ECHOCARDIOGRAM COMPLETE  Result Date: 12/10/2021    ECHOCARDIOGRAM REPORT   Patient Name:   ARIEANA SOMOZA Date of Exam: 12/10/2021 Medical Rec #:  [id number]       Height:       72.0 in Accession #:    [id number]      Weight:       145.0 lb Date of Birth:  1966-01-13       BSA:          1.858 m Patient Age:    21 years        BP:           144/65 mmHg Patient Gender: F               HR:           71 bpm. Exam Location:  [facility] Procedure:  2D Echo, Cardiac Doppler and Color Doppler Indications:     Syncope R55  History:         Patient has prior history of Echocardiogram examinations, most                  recent 12/05/2020. Risk Factors:Diabetes and Hypertension.  Sonographer:     Sherrie Sport Referring Phys:  [id number] [place] Diagnosing Phys: Isaias Cowman MD  Sonographer Comments: Suboptimal apical window. IMPRESSIONS  1. Left ventricular ejection fraction, by estimation, is 55 to 60%. The left ventricle has normal function. The left ventricle has no regional wall motion abnormalities. Left ventricular diastolic parameters were normal.  2. Right ventricular systolic function is normal. The right ventricular size is normal.  3. The mitral valve is normal in structure. Trivial mitral valve regurgitation. No evidence of mitral stenosis.  4. The aortic valve is normal in structure. Aortic valve regurgitation is not visualized. No aortic stenosis is present.  5. The inferior vena cava is normal in size with greater than 50% respiratory variability, suggesting right atrial pressure of 3 mmHg. FINDINGS  Left Ventricle: Left ventricular ejection fraction, by estimation, is 55 to 60%. The left ventricle has normal function. The left ventricle has no regional wall motion abnormalities. The left ventricular internal cavity size was normal in size. There is  no left ventricular hypertrophy. Left ventricular diastolic parameters were normal. Right Ventricle: The right ventricular size is normal. No increase in right ventricular wall thickness. Right ventricular systolic function is normal. Left Atrium: Left atrial size was normal in size. Right Atrium: Right atrial size was normal in size. Pericardium: There is no evidence of pericardial effusion. Mitral Valve: The mitral valve is normal in structure. Trivial mitral valve regurgitation. No evidence of mitral valve stenosis. Tricuspid Valve: The tricuspid valve is normal in structure. Tricuspid valve  regurgitation is trivial. No evidence of  tricuspid stenosis. Aortic Valve: The aortic valve is normal in structure. Aortic valve regurgitation is not visualized. No aortic stenosis is present. Aortic valve mean gradient measures 3.0 mmHg. Aortic valve peak gradient measures 5.0 mmHg. Aortic valve area, by VTI measures 3.79 cm. Pulmonic Valve: The pulmonic valve was normal in structure. Pulmonic valve regurgitation is not visualized. No evidence of pulmonic stenosis. Aorta: The aortic root is normal in size and structure. Venous: The inferior vena cava is normal in size with greater than 50% respiratory variability, suggesting right atrial pressure of 3 mmHg. IAS/Shunts: No atrial level shunt detected by color flow Doppler.  LEFT VENTRICLE PLAX 2D LVIDd:         4.20 cm   Diastology LVIDs:         3.00 cm   LV e' medial:    4.46 cm/s LV PW:         1.20 cm   LV E/e' medial:  14.4 LV IVS:        1.20 cm   LV e' lateral:   6.96 cm/s LVOT diam:     2.00 cm   LV E/e' lateral: 9.2 LV SV:         74 LV SV Index:   40 LVOT Area:     3.14 cm  RIGHT VENTRICLE RV Basal diam:  3.00 cm RV S prime:     17.50 cm/s TAPSE (M-mode): 4.3 cm LEFT ATRIUM             Index        RIGHT ATRIUM           Index LA diam:        3.50 cm 1.88 cm/m   RA Area:     21.00 cm LA Vol (A2C):   56.3 ml 30.29 ml/m  RA Volume:   55.00 ml  29.60 ml/m LA Vol (A4C):   41.8 ml 22.49 ml/m LA Biplane Vol: 51.9 ml 27.93 ml/m  AORTIC VALVE AV Area (Vmax):    3.31 cm AV Area (Vmean):   3.27 cm AV Area (VTI):     3.79 cm AV Vmax:           112.00 cm/s AV Vmean:          80.200 cm/s AV VTI:            0.196 m AV Peak Grad:      5.0 mmHg AV Mean Grad:      3.0 mmHg LVOT Vmax:         118.00 cm/s LVOT Vmean:        83.400 cm/s LVOT VTI:          0.236 m LVOT/AV VTI ratio: 1.21  AORTA Ao Root diam: 3.30 cm MITRAL VALVE               TRICUSPID VALVE MV Area (PHT): 2.03 cm    TR Peak grad:   16.3 mmHg MV Decel Time: 373 msec    TR Vmax:        202.00 cm/s  MV E velocity: 64.30 cm/s MV A velocity: 78.80 cm/s  SHUNTS MV E/A ratio:  0.82        Systemic VTI:  0.24 m                            Systemic Diam: 2.00 cm Isaias Cowman MD Electronically signed by Isaias Cowman MD Signature Date/Time: 12/10/2021/1:15:32  PM    Final    US Carotid Bilateral  Result Date: 12/09/2021 CLINICAL DATA:  Recent syncopal episode EXAM: BILATERAL CAROTID DUPLEX ULTRASOUND TECHNIQUE: Pearline Cables scale imaging, color Doppler and duplex ultrasound were performed of bilateral carotid and vertebral arteries in the neck. COMPARISON:  None Available. FINDINGS: Criteria: Quantification of carotid stenosis is based on velocity parameters that correlate the residual internal carotid diameter with NASCET-based stenosis levels, using the diameter of the distal internal carotid lumen as the denominator for stenosis measurement. The following velocity measurements were obtained: RIGHT ICA: 84/16 cm/sec CCA: 52/84 cm/sec SYSTOLIC ICA/CCA RATIO:  1.0 ECA: 90 cm/sec LEFT ICA: 139/31 cm/sec CCA: 132/44 cm/sec SYSTOLIC ICA/CCA RATIO:  1.3 ECA: 79 cm/sec RIGHT CAROTID ARTERY: Preliminary grayscale images demonstrate no significant atherosclerotic plaque. Waveforms, velocities and flow velocity ratios show no evidence of focal hemodynamically significant stenosis. RIGHT VERTEBRAL ARTERY:  Antegrade in nature. LEFT CAROTID ARTERY: Limited grayscale images demonstrate no significant atherosclerotic plaque. Waveforms, velocities and flow velocity ratios suggest a mild stenosis in the 50-69% range in the lower end of that spectrum. LEFT VERTEBRAL ARTERY:  Antegrade in nature. IMPRESSION: No focal hemodynamically significant stenosis in the right carotid system. Velocities suggest a mild degree of stenosis in the left carotid artery in the lower end of the 50-69% range. Electronically Signed   By: Inez Catalina M.D.   On: 12/09/2021 20:36   CT Head Wo Contrast  Result Date: 12/09/2021 CLINICAL DATA:   Syncope/presyncope, cerebrovascular cause suspected EXAM: CT HEAD WITHOUT CONTRAST TECHNIQUE: Contiguous axial images were obtained from the base of the skull through the vertex without intravenous contrast. RADIATION DOSE REDUCTION: This exam was performed according to the departmental dose-optimization program which includes automated exposure control, adjustment of the mA and/or kV according to patient size and/or use of iterative reconstruction technique. COMPARISON:  July 2022 FINDINGS: Brain: There is no acute intracranial hemorrhage, mass effect, or edema. Gray-white differentiation is preserved. There is no extra-axial fluid collection. Ventricles and sulci are stable in size and configuration. Vascular: There is mild atherosclerotic calcification at the skull base. Skull: Calvarium is unremarkable. Sinuses/Orbits: No acute finding. Other: None. IMPRESSION: No acute intracranial abnormality. Electronically Signed   By: Macy Mis M.D.   On: 12/09/2021 16:40      Subjective: No new complaints  Discharge Exam: Vitals:   12/11/21 0446 12/11/21 0844  BP: (!) 179/57 (!) 145/83  Pulse: (!) 51 (!) 52  Resp: 18 16  Temp: 98.7 F (37.1 C) 99 F (37.2 C)  SpO2: 100% 100%   Vitals:   12/10/21 1926 12/10/21 2334 12/11/21 0446 12/11/21 0844  BP: (!) 141/70 (!) 143/67 (!) 179/57 (!) 145/83  Pulse: 60 (!) 57 (!) 51 (!) 52  Resp: '19 18 18 16  '$ Temp: 98.8 F (37.1 C) 98.6 F (37 C) 98.7 F (37.1 C) 99 F (37.2 C)  TempSrc: Oral Oral Oral Oral  SpO2: 100% 100% 100% 100%  Weight:      Height:        General: Pt is alert, awake, not in acute distress Cardiovascular: RRR, S1/S2 +, no rubs, no gallops Respiratory: CTA bilaterally, no wheezing, no rhonchi Abdominal: Soft, NT, ND, bowel sounds + Extremities: no edema, no cyanosis    The results of significant diagnostics from this hospitalization (including imaging, microbiology, ancillary and laboratory) are listed below for reference.      Microbiology: No results found for this or any previous visit (from the past 240 hour(s)).  Labs: BNP (last 3 results) No results for input(s): "BNP" in the last 8760 hours. Basic Metabolic Panel: Recent Labs  Lab 12/09/21 1548 12/09/21 2329 12/10/21 0527 12/10/21 1044 12/11/21 0614  NA 137  --  138  --  139  K 3.3*  --  3.4*  --  3.9  CL 101  --  104  --  106  CO2 25  --  27  --  24  GLUCOSE 121*  --  83  --  80  BUN 16  --  25*  --  39*  CREATININE 2.94*  --  3.75*  --  5.69*  CALCIUM 8.0*  --  8.3*  --  9.0  MG  --  1.9  --  2.1  --    Liver Function Tests: No results for input(s): "AST", "ALT", "ALKPHOS", "BILITOT", "PROT", "ALBUMIN" in the last 168 hours. No results for input(s): "LIPASE", "AMYLASE" in the last 168 hours. No results for input(s): "AMMONIA" in the last 168 hours. CBC: Recent Labs  Lab 12/09/21 1548 12/10/21 0527 12/11/21 0614  WBC 4.4 6.3 3.5*  NEUTROABS 1.3*  --   --   HGB 8.7* 8.0* 7.9*  HCT 28.0* 25.6* 24.8*  MCV 94.6 95.5 95.4  PLT 184 196 182   Cardiac Enzymes: No results for input(s): "CKTOTAL", "CKMB", "CKMBINDEX", "TROPONINI" in the last 168 hours. BNP: Invalid input(s): "POCBNP" CBG: Recent Labs  Lab 12/10/21 1119 12/10/21 1600 12/10/21 1719 12/10/21 1929 12/11/21 0844  GLUCAP 112* 76 106* 120* 82   D-Dimer No results for input(s): "DDIMER" in the last 72 hours. Hgb A1c Recent Labs    12/09/21 1548  HGBA1C 4.9   Lipid Profile No results for input(s): "CHOL", "HDL", "LDLCALC", "TRIG", "CHOLHDL", "LDLDIRECT" in the last 72 hours. Thyroid function studies No results for input(s): "TSH", "T4TOTAL", "T3FREE", "THYROIDAB" in the last 72 hours.  Invalid input(s): "FREET3" Anemia work up No results for input(s): "VITAMINB12", "FOLATE", "FERRITIN", "TIBC", "IRON", "RETICCTPCT" in the last 72 hours. Urinalysis    Component Value Date/Time   COLORURINE YELLOW (A) 12/10/2021 2100   APPEARANCEUR HAZY (A) 12/10/2021  2100   LABSPEC 1.008 12/10/2021 2100   PHURINE 9.0 (H) 12/10/2021 2100   GLUCOSEU NEGATIVE 12/10/2021 2100   HGBUR NEGATIVE 12/10/2021 2100   [place] NEGATIVE 12/10/2021 2100   [place] NEGATIVE 12/10/2021 2100   PROTEINUR 100 (A) 12/10/2021 2100   NITRITE NEGATIVE 12/10/2021 2100   LEUKOCYTESUR LARGE (A) 12/10/2021 2100   Sepsis Labs Recent Labs  Lab 12/09/21 1548 12/10/21 0527 12/11/21 0614  WBC 4.4 6.3 3.5*   Microbiology No results found for this or any previous visit (from the past 240 hour(s)).   Time coordinating discharge: Over 30 minutes  SIGNED:   Nolberto Hanlon, MD  [facility] 12/11/2021, 11:47 AM Pager   If 7PM-7AM, please contact night-coverage [email or web address] Password [email or web address]

## 2021-12-11 NOTE — Progress Notes (Signed)
[facility]  ROUNDING NOTE   Subjective:   Tonya Myers is a 56 year old female with past medical history including depression, type 2 diabetes, asthma, hypertension, hypothyroidism, CLL, and end-stage renal disease on hemodialysis.   Patient seen resting in bed, no family at bedside Alert and oriented Patient voices concerns of schedule stress test and states she does not want to have this completed without speaking to someone first. Patient then calls her cousin who is a doctor in [place] and voices her concerns with him.  We states that we are the patient's kidney team and that cardiology and are limited on all responses to questions concerning her stress test.  We explained to patient and family that we will hold all antihypertensives prior to dialysis to ensure efficient blood pressure prior to treatment.  Cardiology notified of patient's concerns.   Objective:  Vital signs in last 24 hours:  Temp:  [98.6 F (37 C)-99.1 F (37.3 C)] 99.1 F (37.3 C) (08/18 1216) Pulse Rate:  [51-70] 70 (08/18 1216) Resp:  [16-19] 16 (08/18 1300) BP: (141-179)/(57-83) 142/77 (08/18 1216) SpO2:  [100 %] 100 % (08/18 1216)  Weight change:  Filed Weights   12/09/21 2336  Weight: 65.8 kg    Intake/Output: I/O last 3 completed shifts: In: 2388.8 [P.O.:240; I.V.:2148.8] Out: -    Intake/Output this shift:  No intake/output data recorded.  Physical Exam: General: NAD  Head: Normocephalic, atraumatic. Moist oral mucosal membranes  Eyes: Anicteric  Lungs:  Clear to auscultation, normal effort  Heart: Regular rate and rhythm  Abdomen:  Soft, nontender  Extremities:  trace peripheral edema.  Neurologic: Nonfocal, moving all four extremities  Skin: No lesions  Access: Rt Permcath    Basic Metabolic Panel: Recent Labs  Lab 12/09/21 1548 12/09/21 2329 12/10/21 0527 12/10/21 1044 12/11/21 0614  NA 137  --  138  --  139  K 3.3*  --  3.4*  --  3.9  CL 101  --  104  --   106  CO2 25  --  27  --  24  GLUCOSE 121*  --  83  --  80  BUN 16  --  25*  --  39*  CREATININE 2.94*  --  3.75*  --  5.69*  CALCIUM 8.0*  --  8.3*  --  9.0  MG  --  1.9  --  2.1  --      Liver Function Tests: No results for input(s): "AST", "ALT", "ALKPHOS", "BILITOT", "PROT", "ALBUMIN" in the last 168 hours. No results for input(s): "LIPASE", "AMYLASE" in the last 168 hours. No results for input(s): "AMMONIA" in the last 168 hours.  CBC: Recent Labs  Lab 12/09/21 1548 12/10/21 0527 12/11/21 0614  WBC 4.4 6.3 3.5*  NEUTROABS 1.3*  --   --   HGB 8.7* 8.0* 7.9*  HCT 28.0* 25.6* 24.8*  MCV 94.6 95.5 95.4  PLT 184 196 182     Cardiac Enzymes: No results for input(s): "CKTOTAL", "CKMB", "CKMBINDEX", "TROPONINI" in the last 168 hours.  BNP: Invalid input(s): "POCBNP"  CBG: Recent Labs  Lab 12/10/21 1600 12/10/21 1719 12/10/21 1929 12/11/21 0844 12/11/21 1215  GLUCAP 76 106* 120* 80 172*     Microbiology: Results for orders placed or performed during the hospital encounter of 11/19/20  Blood culture (routine x 2)     Status: None   Collection Time: 11/19/20  4:47 PM   Specimen: BLOOD  Result Value Ref Range Status  Specimen Description BLOOD RIGHT ANTECUBITAL  Final   Special Requests   Final    BOTTLES DRAWN AEROBIC AND ANAEROBIC Blood Culture adequate volume   Culture   Final    NO GROWTH 5 DAYS Performed at [facility], [address] [place], [place] [address]    Report Status 11/24/2020 FINAL  Final  Resp Panel by RT-PCR (Flu A&B, Covid) Nasopharyngeal Swab     Status: None   Collection Time: 11/19/20  4:48 PM   Specimen: Nasopharyngeal Swab; Nasopharyngeal(NP) swabs in vial transport medium  Result Value Ref Range Status   SARS Coronavirus 2 by RT PCR NEGATIVE NEGATIVE Final    Comment: (NOTE) SARS-CoV-2 target nucleic acids are NOT DETECTED.  The SARS-CoV-2 RNA is generally detectable in upper respiratory specimens during the acute  phase of infection. The lowest concentration of SARS-CoV-2 viral copies this assay can detect is 138 copies/mL. A negative result does not preclude SARS-Cov-2 infection and should not be used as the sole basis for treatment or other patient management decisions. A negative result may occur with  improper specimen collection/handling, submission of specimen other than nasopharyngeal swab, presence of viral mutation(s) within the areas targeted by this assay, and inadequate number of viral copies(<138 copies/mL). A negative result must be combined with clinical observations, patient history, and epidemiological information. The expected result is Negative.  Fact Sheet for Patients:  EntrepreneurPulse.com.au  Fact Sheet for Healthcare Providers:  IncredibleEmployment.be  This test is no t yet approved or cleared by the [place] FDA and  has been authorized for detection and/or diagnosis of SARS-CoV-2 by FDA under an Emergency Use Authorization (EUA). This EUA will remain  in effect (meaning this test can be used) for the duration of the COVID-19 declaration under Section 564(b)(1) of the Act, 21 U.S.C.section 360bbb-3(b)(1), unless the authorization is terminated  or revoked sooner.       Influenza A by PCR NEGATIVE NEGATIVE Final   Influenza B by PCR NEGATIVE NEGATIVE Final    Comment: (NOTE) The Xpert Xpress SARS-CoV-2/FLU/RSV plus assay is intended as an aid in the diagnosis of influenza from Nasopharyngeal swab specimens and should not be used as a sole basis for treatment. Nasal washings and aspirates are unacceptable for Xpert Xpress SARS-CoV-2/FLU/RSV testing.  Fact Sheet for Patients: EntrepreneurPulse.com.au  Fact Sheet for Healthcare Providers: IncredibleEmployment.be  This test is not yet approved or cleared by the [place] FDA and has been authorized for detection and/or diagnosis of  SARS-CoV-2 by FDA under an Emergency Use Authorization (EUA). This EUA will remain in effect (meaning this test can be used) for the duration of the COVID-19 declaration under Section 564(b)(1) of the Act, 21 U.S.C. section 360bbb-3(b)(1), unless the authorization is terminated or revoked.  Performed at [facility], [address] [place], [place] [address]   Blood culture (routine x 2)     Status: None   Collection Time: 11/19/20  6:46 PM   Specimen: BLOOD  Result Value Ref Range Status   Specimen Description BLOOD RIGHT ANTECUBITAL  Final   Special Requests   Final    BOTTLES DRAWN AEROBIC AND ANAEROBIC Blood Culture adequate volume   Culture   Final    NO GROWTH 5 DAYS Performed at [facility], [address] [place], [place] [address]    Report Status 11/24/2020 FINAL  Final  Urine Culture     Status: Abnormal   Collection Time: 11/19/20  9:33 PM   Specimen: Urine, Clean  Catch  Result Value Ref Range Status   Specimen Description   Final    URINE, CLEAN CATCH Performed at [facility], [address] [place], [place] [address]    Special Requests   Final    NONE Performed at [facility], [address] [place], [place] [address]    Culture (A)  Final    <10,000 COLONIES/mL INSIGNIFICANT GROWTH Performed at [facility] [address], [place], [place] [address]    Report Status 11/21/2020 FINAL  Final  Culture, fungus without smear     Status: None   Collection Time: 11/20/20 11:09 AM   Specimen: CSF; Cerebrospinal Fluid  Result Value Ref Range Status   Specimen Description   Final    CSF Performed at [facility], [address] [place], [place] [address]    Special Requests   Final    NONE Performed at [facility], [address] [place], [place] [address]    Culture   Final    NO FUNGUS ISOLATED AFTER 21 DAYS Performed at [facility], [place] [address], [place], [place]  [address]    Report Status 12/11/2020 FINAL  Final  CSF culture w Gram Stain     Status: None   Collection Time: 11/20/20 11:09 AM   Specimen: PATH Cytology CSF; Cerebrospinal Fluid  Result Value Ref Range Status   Specimen Description   Final    CSF Performed at [facility], [address], [place], [place] [address]    Special Requests   Final    NONE Performed at [facility], [address] [place], [place] [address]    Gram Stain   Final    NO ORGANISMS SEEN WBC SEEN RED BLOOD CELLS PRESENT Performed at [facility], [address], [place], [place] [address]    Culture   Final    NO GROWTH 3 DAYS Performed at [facility], [place] [address] [place], [place] [address]    Report Status 11/23/2020 FINAL  Final  Fungus Culture With Stain     Status: None   Collection Time: 11/20/20 11:09 AM   Specimen: PATH Cytology CSF; Cerebrospinal Fluid  Result Value Ref Range Status   Fungus Stain Final report  Final   Fungus (Mycology) Culture Final report  Final    Comment: (NOTE) Performed At: [facility] [place], [place] [id number] Rush Farmer MD ZH:[id number]    Fungal Source CSF  Final    Comment: Performed at [facility], [address] [place], [place] [address]  Fungus Culture Result     Status: None   Collection Time: 11/20/20 11:09 AM  Result Value Ref Range Status   Result 1 Comment  Final    Comment: (NOTE) KOH/Calcofluor preparation:  no fungus observed. Performed At: [facility] [place], [place] [id number] Rush Farmer MD HE:[id number]   Fungal organism reflex     Status: None   Collection Time: 11/20/20 11:09 AM  Result Value Ref Range Status   Fungal result 1 Comment  Final    Comment: (NOTE) No yeast or mold isolated after 4 weeks. Performed At: [facility] [place], [place] [id number] Rush Farmer MD YO:[id number]   Resp Panel by  RT-PCR (Flu A&B, Covid) Nasopharyngeal Swab     Status: None   Collection Time: 11/27/20  1:39 PM   Specimen: Nasopharyngeal Swab; Nasopharyngeal(NP) swabs in vial  transport medium  Result Value Ref Range Status   SARS Coronavirus 2 by RT PCR NEGATIVE NEGATIVE Final    Comment: (NOTE) SARS-CoV-2 target nucleic acids are NOT DETECTED.  The SARS-CoV-2 RNA is generally detectable in upper respiratory specimens during the acute phase of infection. The lowest concentration of SARS-CoV-2 viral copies this assay can detect is 138 copies/mL. A negative result does not preclude SARS-Cov-2 infection and should not be used as the sole basis for treatment or other patient management decisions. A negative result may occur with  improper specimen collection/handling, submission of specimen other than nasopharyngeal swab, presence of viral mutation(s) within the areas targeted by this assay, and inadequate number of viral copies(<138 copies/mL). A negative result must be combined with clinical observations, patient history, and epidemiological information. The expected result is Negative.  Fact Sheet for Patients:  EntrepreneurPulse.com.au  Fact Sheet for Healthcare Providers:  IncredibleEmployment.be  This test is no t yet approved or cleared by the [place] FDA and  has been authorized for detection and/or diagnosis of SARS-CoV-2 by FDA under an Emergency Use Authorization (EUA). This EUA will remain  in effect (meaning this test can be used) for the duration of the COVID-19 declaration under Section 564(b)(1) of the Act, 21 U.S.C.section 360bbb-3(b)(1), unless the authorization is terminated  or revoked sooner.       Influenza A by PCR NEGATIVE NEGATIVE Final   Influenza B by PCR NEGATIVE NEGATIVE Final    Comment: (NOTE) The Xpert Xpress SARS-CoV-2/FLU/RSV plus assay is intended as an aid in the diagnosis of influenza from Nasopharyngeal swab  specimens and should not be used as a sole basis for treatment. Nasal washings and aspirates are unacceptable for Xpert Xpress SARS-CoV-2/FLU/RSV testing.  Fact Sheet for Patients: EntrepreneurPulse.com.au  Fact Sheet for Healthcare Providers: IncredibleEmployment.be  This test is not yet approved or cleared by the [place] FDA and has been authorized for detection and/or diagnosis of SARS-CoV-2 by FDA under an Emergency Use Authorization (EUA). This EUA will remain in effect (meaning this test can be used) for the duration of the COVID-19 declaration under Section 564(b)(1) of the Act, 21 U.S.C. section 360bbb-3(b)(1), unless the authorization is terminated or revoked.  Performed at [facility], [address] [place], [place] [address]   Urine Culture     Status: Abnormal   Collection Time: 11/27/20  2:15 PM   Specimen: Kidney; Urine  Result Value Ref Range Status   Specimen Description   Final    URINE, CLEAN CATCH Performed at [facility], [address] [place], [place] [address]    Special Requests   Final    NONE Performed at [facility], [place], [place] [address]    Culture 20,000 COLONIES/mL ENTEROBACTER AEROGENES (A)  Final   Report Status 11/30/2020 FINAL  Final   Organism ID, Bacteria ENTEROBACTER AEROGENES (A)  Final      Susceptibility   Enterobacter aerogenes - MIC*    CEFAZOLIN >=64 RESISTANT Resistant     CEFEPIME 1 SENSITIVE Sensitive     CEFTRIAXONE >=64 RESISTANT Resistant     CIPROFLOXACIN <=0.25 SENSITIVE Sensitive     GENTAMICIN <=1 SENSITIVE Sensitive     IMIPENEM <=0.25 SENSITIVE Sensitive     NITROFURANTOIN 64 INTERMEDIATE Intermediate     TRIMETH/SULFA <=20 SENSITIVE Sensitive     PIP/TAZO >=128 RESISTANT Resistant     * 20,000 COLONIES/mL ENTEROBACTER AEROGENES  CULTURE, BLOOD (ROUTINE X 2) w Reflex to ID  Panel     Status: None   Collection Time:  11/27/20  8:23 PM   Specimen: BLOOD  Result Value Ref Range Status   Specimen Description BLOOD LAC  Final   Special Requests   Final    BOTTLES DRAWN AEROBIC AND ANAEROBIC Blood Culture results may not be optimal due to an inadequate volume of blood received in culture bottles   Culture   Final    NO GROWTH 5 DAYS Performed at [facility], [address] [place], [place] [address]    Report Status 12/02/2020 FINAL  Final  CULTURE, BLOOD (ROUTINE X 2) w Reflex to ID Panel     Status: None   Collection Time: 11/27/20  9:32 PM   Specimen: BLOOD LEFT HAND  Result Value Ref Range Status   Specimen Description BLOOD LEFT HAND  Final   Special Requests   Final    BOTTLES DRAWN AEROBIC AND ANAEROBIC Blood Culture adequate volume   Culture   Final    NO GROWTH 5 DAYS Performed at [facility], [address] [place], [place] [address]    Report Status 12/02/2020 FINAL  Final  CULTURE, BLOOD (ROUTINE X 2) w Reflex to ID Panel     Status: None   Collection Time: 12/06/20 10:35 AM   Specimen: BLOOD  Result Value Ref Range Status   Specimen Description BLOOD LT KNUCKLE  Final   Special Requests   Final    BOTTLES DRAWN AEROBIC AND ANAEROBIC Blood Culture adequate volume   Culture   Final    NO GROWTH 5 DAYS Performed at [facility], [address] [place], [place] [address]    Report Status 12/11/2020 FINAL  Final  CULTURE, BLOOD (ROUTINE X 2) w Reflex to ID Panel     Status: None   Collection Time: 12/06/20 10:35 AM   Specimen: BLOOD  Result Value Ref Range Status   Specimen Description BLOOD BLOOD LEFT WRIST  Final   Special Requests AEROBIC BOTTLE ONLY Blood Culture adequate volume  Final   Culture   Final    NO GROWTH 5 DAYS Performed at [facility], [address], [place], [place] [address]    Report Status 12/11/2020 FINAL  Final  Culture, blood (single) w Reflex to ID Panel     Status: None   Collection Time: 12/14/20  2:36 PM    Specimen: BLOOD  Result Value Ref Range Status   Specimen Description BLOOD BLOOD RIGHT HAND  Final   Special Requests   Final    BOTTLES DRAWN AEROBIC AND ANAEROBIC Blood Culture adequate volume   Culture   Final    NO GROWTH 5 DAYS Performed at [facility], [address], [place], [place] [address]    Report Status 12/19/2020 FINAL  Final  Culture, blood (single) w Reflex to ID Panel     Status: None   Collection Time: 12/14/20  3:38 PM   Specimen: BLOOD  Result Value Ref Range Status   Specimen Description BLOOD A-LINE DRAW  Final   Special Requests   Final    BOTTLES DRAWN AEROBIC AND ANAEROBIC Blood Culture adequate volume   Culture   Final    NO GROWTH 5 DAYS Performed at [facility], [address] [place], [place] [address]    Report Status 12/19/2020 FINAL  Final  CULTURE, BLOOD (ROUTINE X 2) w Reflex to ID Panel     Status: None   Collection Time:  12/18/20 11:44 PM   Specimen: BLOOD LEFT WRIST  Result Value Ref Range Status   Specimen Description BLOOD LEFT WRIST  Final   Special Requests   Final    BOTTLES DRAWN AEROBIC AND ANAEROBIC Blood Culture adequate volume   Culture   Final    NO GROWTH 5 DAYS Performed at [facility], [address] [place], [place] [address]    Report Status 12/23/2020 FINAL  Final  CULTURE, BLOOD (ROUTINE X 2) w Reflex to ID Panel     Status: None   Collection Time: 12/18/20 11:51 PM   Specimen: BLOOD LEFT HAND  Result Value Ref Range Status   Specimen Description BLOOD LEFT HAND  Final   Special Requests   Final    BOTTLES DRAWN AEROBIC AND ANAEROBIC Blood Culture adequate volume   Culture   Final    NO GROWTH 5 DAYS Performed at [facility], [address] [place], [place] [address]    Report Status 12/23/2020 FINAL  Final    Coagulation Studies: Recent Labs    12/10/21 0130  LABPROT 14.0  INR 1.1     Urinalysis: Recent Labs    12/10/21 2100  COLORURINE YELLOW*   LABSPEC 1.008  PHURINE 9.0*  GLUCOSEU NEGATIVE  HGBUR NEGATIVE  BILIRUBINUR NEGATIVE  KETONESUR NEGATIVE  PROTEINUR 100*  NITRITE NEGATIVE  LEUKOCYTESUR LARGE*      Imaging: ECHOCARDIOGRAM COMPLETE  Result Date: 12/10/2021    ECHOCARDIOGRAM REPORT   Patient Name:   Tonya Myers Date of Exam: 12/10/2021 Medical Rec #:  [id number]       Height:       72.0 in Accession #:    [id number]      Weight:       145.0 lb Date of Birth:  21-Jul-1965       BSA:          1.858 m Patient Age:    34 years        BP:           144/65 mmHg Patient Gender: F               HR:           71 bpm. Exam Location:  [facility] Procedure: 2D Echo, Cardiac Doppler and Color Doppler Indications:     Syncope R55  History:         Patient has prior history of Echocardiogram examinations, most                  recent 12/05/2020. Risk Factors:Diabetes and Hypertension.  Sonographer:     Sherrie Sport Referring Phys:  [id number] [place] Diagnosing Phys: Isaias Cowman MD  Sonographer Comments: Suboptimal apical window. IMPRESSIONS  1. Left ventricular ejection fraction, by estimation, is 55 to 60%. The left ventricle has normal function. The left ventricle has no regional wall motion abnormalities. Left ventricular diastolic parameters were normal.  2. Right ventricular systolic function is normal. The right ventricular size is normal.  3. The mitral valve is normal in structure. Trivial mitral valve regurgitation. No evidence of mitral stenosis.  4. The aortic valve is normal in structure. Aortic valve regurgitation is not visualized. No aortic stenosis is present.  5. The inferior vena cava is normal in size with greater than 50% respiratory variability, suggesting right atrial pressure of 3 mmHg. FINDINGS  Left Ventricle: Left ventricular ejection fraction, by estimation, is 55 to 60%. The left ventricle has normal function.  The left ventricle has no regional wall motion abnormalities. The left ventricular internal cavity size was  normal in size. There is  no left ventricular hypertrophy. Left ventricular diastolic parameters were normal. Right Ventricle: The right ventricular size is normal. No increase in right ventricular wall thickness. Right ventricular systolic function is normal. Left Atrium: Left atrial size was normal in size. Right Atrium: Right atrial size was normal in size. Pericardium: There is no evidence of pericardial effusion. Mitral Valve: The mitral valve is normal in structure. Trivial mitral valve regurgitation. No evidence of mitral valve stenosis. Tricuspid Valve: The tricuspid valve is normal in structure. Tricuspid valve regurgitation is trivial. No evidence of tricuspid stenosis. Aortic Valve: The aortic valve is normal in structure. Aortic valve regurgitation is not visualized. No aortic stenosis is present. Aortic valve mean gradient measures 3.0 mmHg. Aortic valve peak gradient measures 5.0 mmHg. Aortic valve area, by VTI measures 3.79 cm. Pulmonic Valve: The pulmonic valve was normal in structure. Pulmonic valve regurgitation is not visualized. No evidence of pulmonic stenosis. Aorta: The aortic root is normal in size and structure. Venous: The inferior vena cava is normal in size with greater than 50% respiratory variability, suggesting right atrial pressure of 3 mmHg. IAS/Shunts: No atrial level shunt detected by color flow Doppler.  LEFT VENTRICLE PLAX 2D LVIDd:         4.20 cm   Diastology LVIDs:         3.00 cm   LV e' medial:    4.46 cm/s LV PW:         1.20 cm   LV E/e' medial:  14.4 LV IVS:        1.20 cm   LV e' lateral:   6.96 cm/s LVOT diam:     2.00 cm   LV E/e' lateral: 9.2 LV SV:         74 LV SV Index:   40 LVOT Area:     3.14 cm  RIGHT VENTRICLE RV Basal diam:  3.00 cm RV S prime:     17.50 cm/s TAPSE (M-mode): 4.3 cm LEFT ATRIUM             Index        RIGHT ATRIUM           Index LA diam:        3.50 cm 1.88 cm/m   RA Area:     21.00 cm LA Vol (A2C):   56.3 ml 30.29 ml/m  RA Volume:    55.00 ml  29.60 ml/m LA Vol (A4C):   41.8 ml 22.49 ml/m LA Biplane Vol: 51.9 ml 27.93 ml/m  AORTIC VALVE AV Area (Vmax):    3.31 cm AV Area (Vmean):   3.27 cm AV Area (VTI):     3.79 cm AV Vmax:           112.00 cm/s AV Vmean:          80.200 cm/s AV VTI:            0.196 m AV Peak Grad:      5.0 mmHg AV Mean Grad:      3.0 mmHg LVOT Vmax:         118.00 cm/s LVOT Vmean:        83.400 cm/s LVOT VTI:          0.236 m LVOT/AV VTI ratio: 1.21  AORTA Ao Root diam: 3.30 cm MITRAL VALVE  TRICUSPID VALVE MV Area (PHT): 2.03 cm    TR Peak grad:   16.3 mmHg MV Decel Time: 373 msec    TR Vmax:        202.00 cm/s MV E velocity: 64.30 cm/s MV A velocity: 78.80 cm/s  SHUNTS MV E/A ratio:  0.82        Systemic VTI:  0.24 m                            Systemic Diam: 2.00 cm Isaias Cowman MD Electronically signed by Isaias Cowman MD Signature Date/Time: 12/10/2021/1:15:32 PM    Final    US Carotid Bilateral  Result Date: 12/09/2021 CLINICAL DATA:  Recent syncopal episode EXAM: BILATERAL CAROTID DUPLEX ULTRASOUND TECHNIQUE: Pearline Cables scale imaging, color Doppler and duplex ultrasound were performed of bilateral carotid and vertebral arteries in the neck. COMPARISON:  None Available. FINDINGS: Criteria: Quantification of carotid stenosis is based on velocity parameters that correlate the residual internal carotid diameter with NASCET-based stenosis levels, using the diameter of the distal internal carotid lumen as the denominator for stenosis measurement. The following velocity measurements were obtained: RIGHT ICA: 84/16 cm/sec CCA: 37/85 cm/sec SYSTOLIC ICA/CCA RATIO:  1.0 ECA: 90 cm/sec LEFT ICA: 139/31 cm/sec CCA: 885/02 cm/sec SYSTOLIC ICA/CCA RATIO:  1.3 ECA: 79 cm/sec RIGHT CAROTID ARTERY: Preliminary grayscale images demonstrate no significant atherosclerotic plaque. Waveforms, velocities and flow velocity ratios show no evidence of focal hemodynamically significant stenosis. RIGHT VERTEBRAL  ARTERY:  Antegrade in nature. LEFT CAROTID ARTERY: Limited grayscale images demonstrate no significant atherosclerotic plaque. Waveforms, velocities and flow velocity ratios suggest a mild stenosis in the 50-69% range in the lower end of that spectrum. LEFT VERTEBRAL ARTERY:  Antegrade in nature. IMPRESSION: No focal hemodynamically significant stenosis in the right carotid system. Velocities suggest a mild degree of stenosis in the left carotid artery in the lower end of the 50-69% range. Electronically Signed   By: Inez Catalina M.D.   On: 12/09/2021 20:36   CT Head Wo Contrast  Result Date: 12/09/2021 CLINICAL DATA:  Syncope/presyncope, cerebrovascular cause suspected EXAM: CT HEAD WITHOUT CONTRAST TECHNIQUE: Contiguous axial images were obtained from the base of the skull through the vertex without intravenous contrast. RADIATION DOSE REDUCTION: This exam was performed according to the departmental dose-optimization program which includes automated exposure control, adjustment of the mA and/or kV according to patient size and/or use of iterative reconstruction technique. COMPARISON:  July 2022 FINDINGS: Brain: There is no acute intracranial hemorrhage, mass effect, or edema. Gray-white differentiation is preserved. There is no extra-axial fluid collection. Ventricles and sulci are stable in size and configuration. Vascular: There is mild atherosclerotic calcification at the skull base. Skull: Calvarium is unremarkable. Sinuses/Orbits: No acute finding. Other: None. IMPRESSION: No acute intracranial abnormality. Electronically Signed   By: Macy Mis M.D.   On: 12/09/2021 16:40     Medications:      [START ON 12/12/2021] amLODipine  5 mg Oral Daily   aspirin EC  81 mg Oral Daily   Chlorhexidine Gluconate Cloth  6 each Topical Q0600   ciprofloxacin  250 mg Oral Q breakfast   insulin aspart  0-15 Units Subcutaneous TID AC & HS   levothyroxine  125 mcg Oral Q0600   montelukast  10 mg Oral QHS    pantoprazole  40 mg Oral Daily   rosuvastatin  10 mg Oral QHS   acetaminophen **OR** acetaminophen, albuterol, fluticasone, magnesium hydroxide  Assessment/ Plan:  [name] is a 56 y.o.  female with past medical history including depression, type 2 diabetes, asthma, hypertension, hypothyroidism, CLL, and end-stage renal disease on hemodialysis.  [facility] [place]/MWF/right PermCath  Hypokalemia with end-stage renal disease on hemodialysis.    Potassium corrected, 3.9.  Scheduled to receive dialysis later today, UF goal 1.5 L as tolerated.  Next scheduled treatment on Monday.  Patient cleared to discharge from renal stance after dialysis today.  2. Anemia of chronic kidney disease Lab Results  Component Value Date   HGB 7.9 (L) 12/11/2021   We will continue to monitor.  Patient receives [place] outpatient.  3. Secondary Hyperparathyroidism: with outpatient labs: PTH 235, phosphorus 6.7, calcium 9.0 on 11/30/2021.   Lab Results  Component Value Date   CALCIUM 9.0 12/11/2021   PHOS 3.2 11/28/2020    We will continue to monitor bone minerals during this admission.  4. 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 Home regimen includes lispro. Most recent hemoglobin A1c is 4.9 on 12/09/2021.   Glucose stable.  5.  Syncopal episode during dialysis treatment.  Cardiology consulted due to elevated troponins overnight.  Echo shows EF 55 to 60%.  Cardiac cath was considered however history in favor of vagal episode.  Patient refusing stress test, cardiology to proceed with conservative measures including outpatient heart monitor for 30 days.   LOS: 0   8/18/20231:39 PM

## 2021-12-11 NOTE — Progress Notes (Addendum)
ANTICOAGULATION CONSULT NOTE - Initial Consult  Pharmacy Consult for Heparin  Indication: chest pain/ACS  Allergies  Allergen Reactions   Amoxapine    Penicillins    Ceftazidime     Other reaction(s): Confusion, Hallucination, Unknown Encephalopathy - improved after dialysis/some concern for cephalosporin neurotoxicity Tolerated without confusion 06/2021. Encephalopathy - improved after dialysis/some concern for cephalosporin neurotoxicity Tolerated without confusion 06/2021.     Patient Measurements: Height: 6' (182.9 cm) Weight: 65.8 kg (145 lb) IBW/kg (Calculated) : 73.1 Heparin Dosing Weight: 65.8 kg   Vital Signs: Temp: 98.7 F (37.1 C) (08/18 0446) Temp Source: Oral (08/18 0446) BP: 179/57 (08/18 0446) Pulse Rate: 51 (08/18 0446)  Labs: Recent Labs    12/09/21 1548 12/09/21 2015 12/09/21 2329 12/10/21 0130 12/10/21 0252 12/10/21 0527 12/10/21 1044 12/10/21 1930 12/11/21 0614  HGB 8.7*  --   --   --   --  8.0*  --   --  7.9*  HCT 28.0*  --   --   --   --  25.6*  --   --  24.8*  PLT 184  --   --   --   --  196  --   --  182  APTT  --   --   --  34  --   --   --   --   --   LABPROT  --   --   --  14.0  --   --   --   --   --   INR  --   --   --  1.1  --   --   --   --   --   HEPARINUNFRC  --   --   --   --   --   --  0.26* 0.34 0.19*  CREATININE 2.94*  --   --   --   --  3.75*  --   --   --   TROPONINIHS 20*   < > 437*  --  452*  --  257*  --   --    < > = values in this interval not displayed.     Estimated Creatinine Clearance: 17 mL/min (A) (by C-G formula based on SCr of 3.75 mg/dL (H)).   Medical History: Past Medical History:  Diagnosis Date   Acute hemorrhoid 03/11/2015   Anxiety    Arthritis    Asthma    Cancer (HCC)    Chronic back pain    CLL (chronic lymphocytic leukemia) (HCC)    Depression    Diabetes mellitus without complication (HCC)    Genital herpes    type 2   Hypertension    Hypothyroidism    Vertigo   Heparin Dosing  Weight: 65.8 kg   Assessment: Pharmacy consulted to dose heparin in this 56 year old F admitted with ACS. PMH is ESRD on HD (MWF), anxiety, asthma, CLL, depression, type 2 diabetes mellitus (A1c 4.9%), ANCA vasculitis, HLD, seizure disorder, chronic pain, GERD, hypertension and hypothyroidism. Pt presented with syncope and ACS having attained ROSC after 2 hrs of CPR. Intracranial and carotid imaging negative for any acute process. Pt conversant upon arrival and denied chest pains or palpitations. Troponin initially trended up from 302 >> 452 but now down to 257. ECG shows NSR w/ occasional PVCs, lateral T wave inversions and a prolonged QTcB interval of 609. Stress test planned for tomorrow. No prior anticoag noted but patient continuing on ASA 81 mg  daily. Hgb stable, Hct stable, PLT stable.  Date Time HL Rate/Comment 8/17 1044 0.26 Subtherapeutic; 750 > 900 units/hr 8/17 1930 0.34 Therapeutic x1; 900 units/hr 8/17 0614 0.19 Subtherapeutic; 900 > 1100 units/hr  Goal of Therapy:  Heparin level 0.3-0.7 units/ml Monitor platelets by anticoagulation protocol: Yes   Plan: Bolus of 2000 units x 1 Increase heparin infusion to 1100 units/hr Recheck anti-Xa level in 8 hrs to see if therapeutic CTM CBC daily while on heparin gtt  Dara Hoyer, PharmD PGY-1 Pharmacy Resident 12/11/2021 7:20 AM

## 2021-12-11 NOTE — Consult Note (Signed)
[place] Nurse Consult Note: Patient receiving care in [facility]. Able to turn with some assistance. Reason for Consult: sacral wound Wound type: healing stage 3 PI to sacrum Pressure Injury POA: Yes Measurement: 4 cm x 1.5 cm x 2 cm with 1.5 cm undermining at 1 o'clock.  Wound bed: 100% clean, pink Drainage (amount, consistency, odor) none Periwound: intact, scarred Dressing procedure/placement/frequency: Gently cleanse sacral wound with saline. Insert lightly moistened saline gauze into the wound (include the undermined area at 1 o'clock), cover with a foam dressing.   Monitor the wound area(s) for worsening of condition such as: Signs/symptoms of infection,  Increase in size,  Development of or worsening of odor, Development of pain, or increased pain at the affected locations.  Notify the medical team if any of these develop.  Thank you for the consult.  Discussed plan of care with the patient.  [place] nurse will not follow at this time.  Please re-consult the [place] team if needed.  Val Riles, RN, MSN, CWOCN, CNS-BC, pager [phone]

## 2021-12-11 NOTE — Progress Notes (Signed)
PT ran for 3 hours 30 min of tx with no issue  UF = 1500  Report given to floor R.N. Bp 156/82 Hr 103 Rr 15  Cvc hep locked and capped.

## 2021-12-11 NOTE — TOC Transition Note (Signed)
Transition of Care [facility]) - [name] Discharge Note   Patient Details  Name: Tonya Myers MRN: [id number] Date of Birth: 1965-11-17  Transition of Care [facility]) [name] Contact:  Candie Chroman, [name] Phone Number: 12/11/2021, 12:06 PM   Clinical Narrative:  Patient has orders to discharge home today. She was active with [facility] for home health PT, OT, RN prior to admission. They are aware of plan for discharge today. Patient confirmed she does have a ride home. No further concerns. CSW signing off.   Final next level of care: [place] Barriers to Discharge: Barriers Resolved   Patient Goals and CMS Choice     Choice offered to / list presented to : NA  Discharge Placement                    Patient and family notified of of transfer: 12/11/21  Discharge Plan and Services                          HH Arranged: RN, PT, OT [facility] Agency: [place] ([place]) Date [place]: 12/11/21   Representative spoke with at [place]: [place] (SDOH) Interventions     Readmission Risk Interventions    11/24/2020    2:17 PM  Readmission Risk Prevention Plan  Transportation Screening Complete  PCP or Specialist Appt within 3-5 Days Complete  HRI or [place] Complete  Social Work Consult for [place] Planning/Counseling Complete  Palliative Care Screening Not Applicable  Medication Review [employer]) Complete

## 2021-12-11 NOTE — Progress Notes (Signed)
[place] NOTE       Patient ID: Tonya Myers MRN: [id number] DOB/AGE: 1965/10/05 56 y.o.  Admit date: 12/09/2021 Referring Physician Dr. Eugenie Norrie Primary Physician Dr. [name]  Primary Cardiologist none Reason for Consultation syncope  HPI: Tonya Myers is a 1yoF with a PMH of ESRD on MWF dialysis, ANCA vasculitis, type 2 diabetes, CLL, hypothyroidism, hypertension, seizure disorder on lacosamide, chronic pain and neuropathy, chronic decubitus ulcer, history of strep pneumo meningitis with epidural abscess and thoracolumbar discitis s/p 10-11 laminectomy and abscess evacuation in 2020 who presented to [facility] ED 12/09/2020 from dialysis due to loss of consciousness.  She was receiving her regularly scheduled dialysis treatment where she felt lightheaded, then passed out.  Per EMS, dialysis staff performed 2 minutes of CPR prior to their arrival.  When they arrived the patient was alert, oriented and conversant.  Cardiology is consulted for further assistance.  Interval History: -Remains with significant chest wall tenderness from CPR, had some dysuria, notes she was diagnosed with a UTI last week.  Did not sleep well due to heparin drip beeping all night long. -Denies anginal symptoms, shortness of breath, dizziness, peripheral edema. -Has questions regarding the [place]  Review of systems complete and found to be negative unless listed above   Past Medical History:  Diagnosis Date   Acute hemorrhoid 03/11/2015   Anxiety    Arthritis    Asthma    Cancer ([place])    Chronic back pain    CLL (chronic lymphocytic leukemia) ([place])    Depression    Diabetes mellitus without complication ([place])    Genital herpes    type 2   Hypertension    Hypothyroidism    Vertigo     Past Surgical History:  Procedure Laterality Date   ABDOMINAL HYSTERECTOMY     BILATERAL SALPINGOOPHORECTOMY  2009   BREAST BIOPSY Right 05/17/2016   FIBROADENOMATOUS CHANGE  AND SCLEROSING ADENOSIS WITH   COLONOSCOPY WITH PROPOFOL N/A 06/16/2015   Procedure: COLONOSCOPY WITH PROPOFOL;  Surgeon: [name], MD;  Location: [facility] ENDOSCOPY;  Service: Endoscopy;  Laterality: N/A;   DIALYSIS/PERMA CATHETER INSERTION N/A 12/17/2020   Procedure: DIALYSIS/PERMA CATHETER INSERTION;  Surgeon: Algernon Huxley, MD;  Location: [place] CV LAB;  Service: Cardiovascular;  Laterality: N/A;   LAPAROSCOPIC SUPRACERVICAL HYSTERECTOMY  2009   due to [place] N/A 12/10/2020   Procedure: TEMPORARY DIALYSIS CATHETER;  Surgeon: Katha Cabal, MD;  Location: [place] CV LAB;  Service: Cardiovascular;  Laterality: N/A;   THORACIC LAMINECTOMY FOR EPIDURAL ABSCESS Bilateral 06/17/2020   Procedure: THORACIC LAMINECTOMY FOR EPIDURAL ABSCESS;  Surgeon: Deetta Perla, MD;  Location: [facility] ORS;  Service: Neurosurgery;  Laterality: Bilateral;    Medications Prior to Admission  Medication Sig Dispense Refill Last Dose   atorvastatin (LIPITOR) 20 MG tablet Take 20 mg by mouth daily.   12/08/2021   levothyroxine (SYNTHROID) 125 MCG tablet Take 125 mcg by mouth daily.   12/08/2021   losartan (COZAAR) 50 MG tablet Take 50 mg by mouth daily.   12/08/2021   sevelamer carbonate (RENVELA) 800 MG tablet Take 800 mg by mouth 3 (three) times daily.   12/08/2021   acetaminophen (TYLENOL) 325 MG tablet Take 2 tablets (650 mg total) by mouth every 6 (six) hours as needed for mild pain (or Fever >/= 101).   prn at prn   albuterol (VENTOLIN HFA) 108 (90 Base) MCG/ACT inhaler  Inhale 2 puffs into the lungs every 6 (six) hours as needed for wheezing or shortness of breath.   prn at prn   amLODipine (NORVASC) 5 MG tablet Take 1 tablet (5 mg total) by mouth daily. (Patient not taking: Reported on 12/09/2021) 30 tablet 0 Not Taking   aspirin EC 81 MG tablet Take 1 tablet (81 mg total) by mouth daily. Swallow whole. (Patient not taking: Reported on 12/09/2021) 30 tablet  11 Not Taking   Continuous Blood Gluc Sensor (DEXCOM G6 SENSOR) MISC SMARTSIG:1 Each Topical Every 10 Days      diclofenac Sodium (VOLTAREN) 1 % GEL Apply 2 g topically 4 (four) times daily. 2 g 0 prn at prn   DULoxetine (CYMBALTA) 30 MG capsule Take 30 mg by mouth daily. (Patient not taking: Reported on 12/09/2021)   Not Taking   fluticasone (FLONASE) 50 MCG/ACT nasal spray 1 spray 2 (two) times daily.   prn at prn   insulin lispro (HUMALOG) 100 UNIT/ML KwikPen CBG < 70: Take glucose/juice/candy as directed CBG 70 - 120: 0 units CBG 121 - 150: 2 units CBG 151 - 200: 3 units CBG 201 - 250: 5 units CBG 251 - 300: 8 units CBG 301 - 350: 11 units CBG 351 - 400: 15 units CBG > 400: 15 units and recheck in 1 hour - if still >400 repeat 15 units and call PCP 15 mL 11 prn at prn   rosuvastatin (CRESTOR) 10 MG tablet Take 1 tablet (10 mg total) by mouth at bedtime. (Patient not taking: Reported on 12/09/2021) 30 tablet 0 Not Taking    Social History   Socioeconomic History   Marital status: Single    Spouse name: Not on file   Number of children: Not on file   Years of education: Not on file   Highest education level: Not on file  Occupational History   Not on file  Tobacco Use   Smoking status: Never   Smokeless tobacco: Never  Vaping Use   Vaping Use: Never used  Substance and Sexual Activity   Alcohol use: Yes    Alcohol/week: 1.0 standard drink of alcohol    Types: 1 Cans of beer per week   Drug use: No   Sexual activity: Not Currently    Birth control/protection: None  Other Topics Concern   Not on file  Social History Narrative   Not on file   Social Determinants of Health   Financial Resource Strain: Not on file  Food Insecurity: Not on file  Transportation Needs: Not on file  Physical Activity: Not on file  Stress: Not on file  Social Connections: Not on file  Intimate Partner Violence: Not on file    Family History  Problem Relation Age of Onset   Cancer  Paternal Aunt    Breast cancer Maternal Aunt 11      PHYSICAL EXAM General: Pleasant middle-aged black female, well nourished, in no acute distress. Sitting upright in hospital bed, the patient's cousin present by phone during this encounter. HEENT:  Normocephalic and atraumatic. Neck:  No JVD.  Lungs: Normal respiratory effort on room air. Clear bilaterally to auscultation. No wheezes, crackles, rhonchi.  Heart: HRRR . Normal S1 and S2 without gallops or murmurs.  Abdomen: Non-distended appearing.  Msk: Normal strength and tone for age. Extremities: Warm and well perfused. No clubbing, cyanosis.  No peripheral edema.  Neuro: Alert and oriented X 3. Psych:  Answers questions appropriately.   Labs:   Lab  Results  Component Value Date   WBC 3.5 (L) 12/11/2021   HGB 7.9 (L) 12/11/2021   HCT 24.8 (L) 12/11/2021   MCV 95.4 12/11/2021   PLT 182 12/11/2021    Recent Labs  Lab 12/11/21 0614  NA 139  K 3.9  CL 106  CO2 24  BUN 39*  CREATININE 5.69*  CALCIUM 9.0  GLUCOSE 80    Lab Results  Component Value Date   CKTOTAL 19 (L) 12/03/2020     Lab Results  Component Value Date   CHOL 91 10/02/2020   Lab Results  Component Value Date   HDL 25 (L) 10/02/2020   Lab Results  Component Value Date   LDLCALC 47 10/02/2020   Lab Results  Component Value Date   TRIG 143 11/22/2020   TRIG 96 10/02/2020   Lab Results  Component Value Date   CHOLHDL 3.6 10/02/2020   No results found for: "LDLDIRECT"    Radiology: ECHOCARDIOGRAM COMPLETE  Result Date: 12/10/2021    ECHOCARDIOGRAM REPORT   Patient Name:   SHAYLIN BLATT Date of Exam: 12/10/2021 Medical Rec #:  [id number]       Height:       72.0 in Accession #:    [id number]      Weight:       145.0 lb Date of Birth:  [birth year]       BSA:          1.858 m Patient Age:    26 years        BP:           144/65 mmHg Patient Gender: F               HR:           71 bpm. Exam Location:  [facility] Procedure: 2D Echo, Cardiac Doppler  and Color Doppler Indications:     Syncope R55  History:         Patient has prior history of Echocardiogram examinations, most                  recent 12/05/2020. Risk Factors:Diabetes and Hypertension.  Sonographer:     Sherrie Sport Referring Phys:  [id number] [place] Diagnosing Phys: Isaias Cowman MD  Sonographer Comments: Suboptimal apical window. IMPRESSIONS  1. Left ventricular ejection fraction, by estimation, is 55 to 60%. The left ventricle has normal function. The left ventricle has no regional wall motion abnormalities. Left ventricular diastolic parameters were normal.  2. Right ventricular systolic function is normal. The right ventricular size is normal.  3. The mitral valve is normal in structure. Trivial mitral valve regurgitation. No evidence of mitral stenosis.  4. The aortic valve is normal in structure. Aortic valve regurgitation is not visualized. No aortic stenosis is present.  5. The inferior vena cava is normal in size with greater than 50% respiratory variability, suggesting right atrial pressure of 3 mmHg. FINDINGS  Left Ventricle: Left ventricular ejection fraction, by estimation, is 55 to 60%. The left ventricle has normal function. The left ventricle has no regional wall motion abnormalities. The left ventricular internal cavity size was normal in size. There is  no left ventricular hypertrophy. Left ventricular diastolic parameters were normal. Right Ventricle: The right ventricular size is normal. No increase in right ventricular wall thickness. Right ventricular systolic function is normal. Left Atrium: Left atrial size was normal in size. Right Atrium: Right atrial size was normal in size. Pericardium: There is no evidence  of pericardial effusion. Mitral Valve: The mitral valve is normal in structure. Trivial mitral valve regurgitation. No evidence of mitral valve stenosis. Tricuspid Valve: The tricuspid valve is normal in structure. Tricuspid valve regurgitation is trivial. No  evidence of tricuspid stenosis. Aortic Valve: The aortic valve is normal in structure. Aortic valve regurgitation is not visualized. No aortic stenosis is present. Aortic valve mean gradient measures 3.0 mmHg. Aortic valve peak gradient measures 5.0 mmHg. Aortic valve area, by VTI measures 3.79 cm. Pulmonic Valve: The pulmonic valve was normal in structure. Pulmonic valve regurgitation is not visualized. No evidence of pulmonic stenosis. Aorta: The aortic root is normal in size and structure. Venous: The inferior vena cava is normal in size with greater than 50% respiratory variability, suggesting right atrial pressure of 3 mmHg. IAS/Shunts: No atrial level shunt detected by color flow Doppler.  LEFT VENTRICLE PLAX 2D LVIDd:         4.20 cm   Diastology LVIDs:         3.00 cm   LV e' medial:    4.46 cm/s LV PW:         1.20 cm   LV E/e' medial:  14.4 LV IVS:        1.20 cm   LV e' lateral:   6.96 cm/s LVOT diam:     2.00 cm   LV E/e' lateral: 9.2 LV SV:         74 LV SV Index:   40 LVOT Area:     3.14 cm  RIGHT VENTRICLE RV Basal diam:  3.00 cm RV S prime:     17.50 cm/s TAPSE (M-mode): 4.3 cm LEFT ATRIUM             Index        RIGHT ATRIUM           Index LA diam:        3.50 cm 1.88 cm/m   RA Area:     21.00 cm LA Vol (A2C):   56.3 ml 30.29 ml/m  RA Volume:   55.00 ml  29.60 ml/m LA Vol (A4C):   41.8 ml 22.49 ml/m LA Biplane Vol: 51.9 ml 27.93 ml/m  AORTIC VALVE AV Area (Vmax):    3.31 cm AV Area (Vmean):   3.27 cm AV Area (VTI):     3.79 cm AV Vmax:           112.00 cm/s AV Vmean:          80.200 cm/s AV VTI:            0.196 m AV Peak Grad:      5.0 mmHg AV Mean Grad:      3.0 mmHg LVOT Vmax:         118.00 cm/s LVOT Vmean:        83.400 cm/s LVOT VTI:          0.236 m LVOT/AV VTI ratio: 1.21  AORTA Ao Root diam: 3.30 cm MITRAL VALVE               TRICUSPID VALVE MV Area (PHT): 2.03 cm    TR Peak grad:   16.3 mmHg MV Decel Time: 373 msec    TR Vmax:        202.00 cm/s MV E velocity: 64.30 cm/s MV A  velocity: 78.80 cm/s  SHUNTS MV E/A ratio:  0.82        Systemic VTI:  0.24 m  Systemic Diam: 2.00 cm Isaias Cowman MD Electronically signed by Isaias Cowman MD Signature Date/Time: 12/10/2021/1:15:32 PM    Final    US Carotid Bilateral  Result Date: 12/09/2021 CLINICAL DATA:  Recent syncopal episode EXAM: BILATERAL CAROTID DUPLEX ULTRASOUND TECHNIQUE: Pearline Cables scale imaging, color Doppler and duplex ultrasound were performed of bilateral carotid and vertebral arteries in the neck. COMPARISON:  None Available. FINDINGS: Criteria: Quantification of carotid stenosis is based on velocity parameters that correlate the residual internal carotid diameter with NASCET-based stenosis levels, using the diameter of the distal internal carotid lumen as the denominator for stenosis measurement. The following velocity measurements were obtained: RIGHT ICA: 84/16 cm/sec CCA: 19/14 cm/sec SYSTOLIC ICA/CCA RATIO:  1.0 ECA: 90 cm/sec LEFT ICA: 139/31 cm/sec CCA: 782/95 cm/sec SYSTOLIC ICA/CCA RATIO:  1.3 ECA: 79 cm/sec RIGHT CAROTID ARTERY: Preliminary grayscale images demonstrate no significant atherosclerotic plaque. Waveforms, velocities and flow velocity ratios show no evidence of focal hemodynamically significant stenosis. RIGHT VERTEBRAL ARTERY:  Antegrade in nature. LEFT CAROTID ARTERY: Limited grayscale images demonstrate no significant atherosclerotic plaque. Waveforms, velocities and flow velocity ratios suggest a mild stenosis in the 50-69% range in the lower end of that spectrum. LEFT VERTEBRAL ARTERY:  Antegrade in nature. IMPRESSION: No focal hemodynamically significant stenosis in the right carotid system. Velocities suggest a mild degree of stenosis in the left carotid artery in the lower end of the 50-69% range. Electronically Signed   By: Inez Catalina M.D.   On: 12/09/2021 20:36   CT Head Wo Contrast  Result Date: 12/09/2021 CLINICAL DATA:  Syncope/presyncope, cerebrovascular  cause suspected EXAM: CT HEAD WITHOUT CONTRAST TECHNIQUE: Contiguous axial images were obtained from the base of the skull through the vertex without intravenous contrast. RADIATION DOSE REDUCTION: This exam was performed according to the departmental dose-optimization program which includes automated exposure control, adjustment of the mA and/or kV according to patient size and/or use of iterative reconstruction technique. COMPARISON:  July 2022 FINDINGS: Brain: There is no acute intracranial hemorrhage, mass effect, or edema. Gray-white differentiation is preserved. There is no extra-axial fluid collection. Ventricles and sulci are stable in size and configuration. Vascular: There is mild atherosclerotic calcification at the skull base. Skull: Calvarium is unremarkable. Sinuses/Orbits: No acute finding. Other: None. IMPRESSION: No acute intracranial abnormality. Electronically Signed   By: Macy Mis M.D.   On: 12/09/2021 16:40    ECHO 04/23/2021 Exam Type:     ECHOCARDIOGRAM FOLLOW UP/LIMITED ECHO   Study Info  Indications       - Hx effusion with hypotension   Limited two-dimensional, color flow and Doppler transthoracic echocardiogram  is performed.   Staff  Reading Fellow:     Yates Decamp MD  Sonographer:     Cindy Hazy, RDCS  Ordering Physician:     Vida Roller   Account #:     [id number]    Summary    1. The left ventricle is normal in size with mildly increased wall  thickness.    2. The left ventricular systolic function is hyperdynamic, LVEF is visually  estimated at >70%.    3. The right ventricle is normal in size, with normal systolic function.    4. There is a moderate circumferential pericardial effusion.    5. There is no echocardiographic evidence of tamponade physiology.    Left Ventricle    The left ventricle is normal in size with mildly increased wall thickness.    The left ventricular systolic function is hyperdynamic, LVEF is visually  estimated at >70%.    There is normal left ventricular diastolic function.   Right Ventricle    The right ventricle is normal in size, with normal systolic function.    Left Atrium    The left atrium is normal in size.   Right Atrium    The right atrium is normal  in size.    Aortic Valve    The aortic valve is trileaflet with normal appearing leaflets with normal  excursion.    There is no significant aortic regurgitation.    There is no evidence of a significant transvalvular gradient.   Pulmonic Valve    Pulmonary valve is not well visualized.    There is no significant pulmonic regurgitation.    There is no evidence of a significant transvalvular gradient.   Mitral Valve    The mitral valve leaflets are normal with normal leaflet mobility.    There is no significant mitral valve regurgitation.   Tricuspid Valve    The tricuspid valve leaflets are normal, with normal leaflet mobility.    There is no significant tricuspid regurgitation.    The pulmonary systolic pressure cannot be estimated due to insufficient TR  signal.    Pericardium/Pleural    There is a moderate circumferential pericardial effusion.    Maximal dimension of the pericardial effusion:  1.2 cm.    There is no right ventricular diastolic collapse, no right atrial inversion  and no exaggerated respiratory variation in transvalvular Doppler flow  velocities.    Pericardial fat pad  present.    There is no echocardiographic evidence of tamponade physiology.   Inferior Vena Cava    IVC size and inspiratory change suggest normal right atrial pressure. (0-5  mmHg).   Aorta    The aorta is normal in size in the visualized segments.    Mitral Valve  ----------------------------------------------------------------------  Name                                 Value        Normal  ----------------------------------------------------------------------   MV Diastolic Function   ----------------------------------------------------------------------  MV E Peak Velocity                 74 cm/s                MV A Peak Velocity                 67 cm/s                 MV E/A                                 1.1                 MV Annular TDI  ----------------------------------------------------------------------  MV Septal e' Velocity             6.3 cm/s         >=8.0  MV Lateral e' Velocity            9.7 cm/s        >=10.0  MV e' Average                          8.0  MV E/e' (Average)                      9.6   Tricuspid Valve  ----------------------------------------------------------------------  Name                                 Value        Normal  ----------------------------------------------------------------------   Estimated PAP/RSVP  ----------------------------------------------------------------------  RA Pressure                         3 mmHg           <=5   Aorta  ----------------------------------------------------------------------  Name                                 Value        Normal  ----------------------------------------------------------------------   Ascending Aorta  ----------------------------------------------------------------------  Ao Root Diameter (2D)               3.5 cm                Ao Root Diam Index (2D)         18.4 cm/m2   Venous  ----------------------------------------------------------------------  Name                                 Value        Normal  ----------------------------------------------------------------------   IVC/SVC  ----------------------------------------------------------------------  IVC Diameter (Exp 2D)               1.7 cm         <=2.1   Ventricles  ----------------------------------------------------------------------  Name                                 Value        Normal  ----------------------------------------------------------------------   LV  Dimensions 2D/MM  ----------------------------------------------------------------------  IVS Diastolic Thickness (2D)        1.4 cm       0.6-0.9  LVID Diastole (2D)                  4.2 cm       7.0-9.6  LVPW Diastolic Thickness  (2D)                                1.1 cm       0.6-0.9  LVID Systole (2D)                   2.9 cm       2.2-3.5   RV Dimensions 2D/MM  ----------------------------------------------------------------------  TAPSE                               2.5 cm         >=1.7   Atria  ----------------------------------------------------------------------  Name                                 Value        Normal  ----------------------------------------------------------------------  LA Dimensions  ----------------------------------------------------------------------  LA Dimension (2D)                   4.5 cm       2.7-3.8   Pericardium  ----------------------------------------------------------------------  Name                                 Value        Normal  ----------------------------------------------------------------------   Pericardium 2D/MM  ----------------------------------------------------------------------  Pericardial Effusion  Diastole (2D)                       1.2 cm    Report Signatures  Finalized by Stephani Police  MD on 04/23/2021 11:43 AM  Resident Norva Pavlov  MD on 04/22/2021 03:36 PM  TELEMETRY reviewed by me: Sinus bradycardia rate high 50s to sinus rhythm in the 60s without evidence of high degree AV block or sinus pauses.  EKG reviewed by me: Sinus rhythm rate 85, lateral T wave inversions   ASSESSMENT AND PLAN:  Tonya Myers is a 59yoF with a PMH of ESRD on MWF dialysis, ANCA vasculitis, type 2 diabetes, CLL, hypothyroidism, hypertension, seizure disorder on lacosamide, chronic pain and neuropathy, chronic decubitus ulcer, history of strep pneumo meningitis with epidural abscess and thoracolumbar discitis s/p 10-11  laminectomy and abscess evacuation in 2020 who presented to [facility] ED 12/09/2020 from dialysis due to loss of consciousness.  She was receiving her regularly scheduled dialysis treatment where she felt lightheaded, then passed out.  Per EMS, dialysis staff performed 2 minutes of CPR prior to their arrival.  When they arrived the patient was alert, oriented and conversant.  Cardiology is consulted for further assistance.  #Syncope #Elevated troponin #ESRD on MWF dialysis The patient presents after losing consciousness during her regularly scheduled dialysis yesterday 8/16.  She remembers feeling lightheaded before her treatment, but does not remember much else.  Staff at the dialysis center performed 2 minutes of CPR, and by the time EMS arrived she was alert and conversant.  She denies chest pain, and is overall feeling well now.  Troponin is uptrending from 20-302-452 and has down trended to 257 now.  EKG shows sinus rhythm with some lateral T wave inversions and a prolonged QT interval.  Notably, she reports similar episodes of lightheadedness and low blood pressure during dialysis treatments in the past - suspect her syncopal episode was vasovagal/hypotensive in nature, and troponin elevation is demand ischemia in that setting. -S/p 325 mg aspirin, continue 81 mg aspirin daily -Continue rosuvastatin 10 mg daily at bedtime -Discontinue heparin drip for now -Continuous monitoring on telemetry -Monitor and replete electrolytes as needed -Echocardiogram complete resulted with preserved LVEF of 55-60% without WMA's or other valvular pathologies. -Discussed with the patient and her cousin by phone with her permission ([facility] ICU DNP) the risks, benefits and alternatives to the [facility].  She strongly prefers to pursue a conservative strategy from a cardiac standpoint and prefers close follow-up with Dr. Saralyn Pilar on an outpatient basis, which is certainly reasonable.  We will place a 30-day live event  heart monitor to evaluate for further arrhythmias that of could potentially proceeded her syncopal event. -No further cardiac diagnostics necessary at this time, she is okay for discharge today from a cardiac standpoint.  This patient's plan of care was discussed and created with Dr. Saralyn Pilar and he is in agreement.  Signed: Tim Lair  Thea Silversmith , PA-C 12/11/2021, 8:23 AM [facility] Card[facility]

## 2021-12-12 ENCOUNTER — Other Ambulatory Visit: Payer: Self-pay

## 2021-12-12 ENCOUNTER — Inpatient Hospital Stay
Admission: EM | Admit: 2021-12-12 | Discharge: 2021-12-24 | DRG: 312 | Disposition: A | Discharge status: Skilled Nursing Facility | Payer: Medicare Other | Attending: Internal Medicine | Admitting: Internal Medicine

## 2021-12-12 ENCOUNTER — Emergency Department: Payer: Medicare Other

## 2021-12-12 DIAGNOSIS — R55 Syncope and collapse: Principal | ICD-10-CM | POA: Diagnosis present

## 2021-12-12 DIAGNOSIS — G40909 Epilepsy, unspecified, not intractable, without status epilepticus: Secondary | ICD-10-CM | POA: Diagnosis present

## 2021-12-12 DIAGNOSIS — Z7982 Long term (current) use of aspirin: Secondary | ICD-10-CM

## 2021-12-12 DIAGNOSIS — E039 Hypothyroidism, unspecified: Secondary | ICD-10-CM | POA: Diagnosis present

## 2021-12-12 DIAGNOSIS — S2220XA Unspecified fracture of sternum, initial encounter for closed fracture: Secondary | ICD-10-CM | POA: Diagnosis present

## 2021-12-12 DIAGNOSIS — R29818 Other symptoms and signs involving the nervous system: Secondary | ICD-10-CM

## 2021-12-12 DIAGNOSIS — C9111 Chronic lymphocytic leukemia of B-cell type in remission: Secondary | ICD-10-CM | POA: Diagnosis present

## 2021-12-12 DIAGNOSIS — Z794 Long term (current) use of insulin: Secondary | ICD-10-CM

## 2021-12-12 DIAGNOSIS — Z888 Allergy status to other drugs, medicaments and biological substances status: Secondary | ICD-10-CM

## 2021-12-12 DIAGNOSIS — D631 Anemia in chronic kidney disease: Secondary | ICD-10-CM | POA: Diagnosis not present

## 2021-12-12 DIAGNOSIS — E440 Moderate protein-calorie malnutrition: Secondary | ICD-10-CM | POA: Diagnosis not present

## 2021-12-12 DIAGNOSIS — Z90710 Acquired absence of both cervix and uterus: Secondary | ICD-10-CM

## 2021-12-12 DIAGNOSIS — F32A Depression, unspecified: Secondary | ICD-10-CM | POA: Diagnosis present

## 2021-12-12 DIAGNOSIS — R197 Diarrhea, unspecified: Secondary | ICD-10-CM | POA: Diagnosis not present

## 2021-12-12 DIAGNOSIS — F419 Anxiety disorder, unspecified: Secondary | ICD-10-CM | POA: Diagnosis present

## 2021-12-12 DIAGNOSIS — E1122 Type 2 diabetes mellitus with diabetic chronic kidney disease: Secondary | ICD-10-CM | POA: Diagnosis not present

## 2021-12-12 DIAGNOSIS — N186 End stage renal disease: Secondary | ICD-10-CM | POA: Diagnosis present

## 2021-12-12 DIAGNOSIS — Z880 Allergy status to penicillin: Secondary | ICD-10-CM

## 2021-12-12 DIAGNOSIS — Z79891 Long term (current) use of opiate analgesic: Secondary | ICD-10-CM

## 2021-12-12 DIAGNOSIS — G8929 Other chronic pain: Secondary | ICD-10-CM | POA: Diagnosis present

## 2021-12-12 DIAGNOSIS — E785 Hyperlipidemia, unspecified: Secondary | ICD-10-CM | POA: Diagnosis not present

## 2021-12-12 DIAGNOSIS — K5289 Other specified noninfective gastroenteritis and colitis: Secondary | ICD-10-CM | POA: Diagnosis not present

## 2021-12-12 DIAGNOSIS — Z79890 Hormone replacement therapy (postmenopausal): Secondary | ICD-10-CM

## 2021-12-12 DIAGNOSIS — R531 Weakness: Secondary | ICD-10-CM

## 2021-12-12 DIAGNOSIS — K219 Gastro-esophageal reflux disease without esophagitis: Secondary | ICD-10-CM | POA: Diagnosis present

## 2021-12-12 DIAGNOSIS — L89154 Pressure ulcer of sacral region, stage 4: Secondary | ICD-10-CM | POA: Diagnosis present

## 2021-12-12 DIAGNOSIS — E1129 Type 2 diabetes mellitus with other diabetic kidney complication: Secondary | ICD-10-CM | POA: Diagnosis present

## 2021-12-12 DIAGNOSIS — I7782 Antineutrophilic cytoplasmic antibody (ANCA) vasculitis: Secondary | ICD-10-CM | POA: Diagnosis present

## 2021-12-12 DIAGNOSIS — Z20822 Contact with and (suspected) exposure to covid-19: Secondary | ICD-10-CM | POA: Diagnosis present

## 2021-12-12 DIAGNOSIS — M1990 Unspecified osteoarthritis, unspecified site: Secondary | ICD-10-CM | POA: Diagnosis present

## 2021-12-12 DIAGNOSIS — I700 Atherosclerosis of aorta: Secondary | ICD-10-CM | POA: Diagnosis present

## 2021-12-12 DIAGNOSIS — R471 Dysarthria and anarthria: Secondary | ICD-10-CM | POA: Diagnosis not present

## 2021-12-12 DIAGNOSIS — K861 Other chronic pancreatitis: Secondary | ICD-10-CM | POA: Diagnosis not present

## 2021-12-12 DIAGNOSIS — A6009 Herpesviral infection of other urogenital tract: Secondary | ICD-10-CM | POA: Diagnosis present

## 2021-12-12 DIAGNOSIS — E876 Hypokalemia: Secondary | ICD-10-CM | POA: Diagnosis not present

## 2021-12-12 DIAGNOSIS — N2581 Secondary hyperparathyroidism of renal origin: Secondary | ICD-10-CM | POA: Diagnosis present

## 2021-12-12 DIAGNOSIS — I10 Essential (primary) hypertension: Secondary | ICD-10-CM | POA: Diagnosis not present

## 2021-12-12 DIAGNOSIS — Z9079 Acquired absence of other genital organ(s): Secondary | ICD-10-CM

## 2021-12-12 DIAGNOSIS — J45909 Unspecified asthma, uncomplicated: Secondary | ICD-10-CM | POA: Diagnosis present

## 2021-12-12 DIAGNOSIS — I120 Hypertensive chronic kidney disease with stage 5 chronic kidney disease or end stage renal disease: Secondary | ICD-10-CM | POA: Diagnosis present

## 2021-12-12 DIAGNOSIS — Z992 Dependence on renal dialysis: Secondary | ICD-10-CM | POA: Diagnosis not present

## 2021-12-12 DIAGNOSIS — L89159 Pressure ulcer of sacral region, unspecified stage: Secondary | ICD-10-CM

## 2021-12-12 DIAGNOSIS — I248 Other forms of acute ischemic heart disease: Secondary | ICD-10-CM | POA: Diagnosis present

## 2021-12-12 DIAGNOSIS — E1142 Type 2 diabetes mellitus with diabetic polyneuropathy: Secondary | ICD-10-CM | POA: Diagnosis not present

## 2021-12-12 DIAGNOSIS — Z79899 Other long term (current) drug therapy: Secondary | ICD-10-CM

## 2021-12-12 DIAGNOSIS — S31000A Unspecified open wound of lower back and pelvis without penetration into retroperitoneum, initial encounter: Secondary | ICD-10-CM | POA: Diagnosis not present

## 2021-12-12 DIAGNOSIS — G9341 Metabolic encephalopathy: Secondary | ICD-10-CM | POA: Diagnosis present

## 2021-12-12 DIAGNOSIS — Z8619 Personal history of other infectious and parasitic diseases: Secondary | ICD-10-CM | POA: Diagnosis present

## 2021-12-12 DIAGNOSIS — N3000 Acute cystitis without hematuria: Secondary | ICD-10-CM | POA: Diagnosis not present

## 2021-12-12 DIAGNOSIS — I5A Non-ischemic myocardial injury (non-traumatic): Secondary | ICD-10-CM | POA: Diagnosis not present

## 2021-12-12 DIAGNOSIS — Z8661 Personal history of infections of the central nervous system: Secondary | ICD-10-CM

## 2021-12-12 DIAGNOSIS — N390 Urinary tract infection, site not specified: Secondary | ICD-10-CM | POA: Diagnosis present

## 2021-12-12 DIAGNOSIS — Z7951 Long term (current) use of inhaled steroids: Secondary | ICD-10-CM

## 2021-12-12 DIAGNOSIS — A0472 Enterocolitis due to Clostridium difficile, not specified as recurrent: Principal | ICD-10-CM

## 2021-12-12 DIAGNOSIS — A419 Sepsis, unspecified organism: Secondary | ICD-10-CM | POA: Insufficient documentation

## 2021-12-12 DIAGNOSIS — J4520 Mild intermittent asthma, uncomplicated: Secondary | ICD-10-CM | POA: Diagnosis not present

## 2021-12-12 DIAGNOSIS — K5641 Fecal impaction: Secondary | ICD-10-CM | POA: Diagnosis present

## 2021-12-12 DIAGNOSIS — S31000D Unspecified open wound of lower back and pelvis without penetration into retroperitoneum, subsequent encounter: Secondary | ICD-10-CM | POA: Diagnosis not present

## 2021-12-12 DIAGNOSIS — Z90722 Acquired absence of ovaries, bilateral: Secondary | ICD-10-CM

## 2021-12-12 DIAGNOSIS — R4781 Slurred speech: Secondary | ICD-10-CM | POA: Diagnosis not present

## 2021-12-12 LAB — PROTIME-INR
INR: 1 (ref 0.8–1.2)
Prothrombin Time: 13.2 seconds (ref 11.4–15.2)

## 2021-12-12 LAB — COMPREHENSIVE METABOLIC PANEL
ALT: 25 U/L (ref 0–44)
AST: 34 U/L (ref 15–41)
Albumin: 3.3 g/dL — ABNORMAL LOW (ref 3.5–5.0)
Alkaline Phosphatase: 69 U/L (ref 38–126)
Anion gap: 11 (ref 5–15)
BUN: 20 mg/dL (ref 6–20)
CO2: 26 mmol/L (ref 22–32)
Calcium: 9.2 mg/dL (ref 8.9–10.3)
Chloride: 102 mmol/L (ref 98–111)
Creatinine, Ser: 3.31 mg/dL — ABNORMAL HIGH (ref 0.44–1.00)
GFR, Estimated: 16 mL/min — ABNORMAL LOW (ref >60)
Glucose, Bld: 82 mg/dL (ref 70–99)
Potassium: 4.1 mmol/L (ref 3.5–5.1)
Sodium: 139 mmol/L (ref 135–145)
Total Bilirubin: 0.7 mg/dL (ref 0.3–1.2)
Total Protein: 6.1 g/dL — ABNORMAL LOW (ref 6.5–8.1)

## 2021-12-12 LAB — GLUCOSE, CAPILLARY
Glucose-Capillary: 88 mg/dL (ref 70–99)
Glucose-Capillary: 79 mg/dL (ref 70–99)

## 2021-12-12 LAB — GASTROINTESTINAL PANEL BY PCR, STOOL (REPLACES STOOL CULTURE)

## 2021-12-12 LAB — URINE CULTURE: Culture: <10000 — AB

## 2021-12-12 LAB — CBC WITH DIFFERENTIAL/PLATELET
Abs Immature Granulocytes: 0.01 10*3/uL (ref 0.00–0.07)
Basophils Absolute: 0.1 10*3/uL (ref 0.0–0.1)
Basophils Relative: 1 %
Eosinophils Absolute: 0.2 10*3/uL (ref 0.0–0.5)
Eosinophils Relative: 4 %
HCT: 29 % — ABNORMAL LOW (ref 36.0–46.0)
Hemoglobin: 9.1 g/dL — ABNORMAL LOW (ref 12.0–15.0)
Immature Granulocytes: 0 %
Lymphocytes Relative: 39 %
Lymphs Abs: 1.6 10*3/uL (ref 0.7–4.0)
MCH: 29.8 pg (ref 26.0–34.0)
MCHC: 31.4 g/dL (ref 30.0–36.0)
MCV: 95.1 fL (ref 80.0–100.0)
Monocytes Absolute: 0.6 10*3/uL (ref 0.1–1.0)
Monocytes Relative: 14 %
Neutro Abs: 1.7 10*3/uL (ref 1.7–7.7)
Neutrophils Relative %: 42 %
Platelets: 206 10*3/uL (ref 150–400)
RBC: 3.05 MIL/uL — ABNORMAL LOW (ref 3.87–5.11)
RDW: 18.2 % — ABNORMAL HIGH (ref 11.5–15.5)
WBC: 4.2 10*3/uL (ref 4.0–10.5)
nRBC: 0 % (ref 0.0–0.2)

## 2021-12-12 LAB — CBG MONITORING, ED
Glucose-Capillary: 74 mg/dL (ref 70–99)
Glucose-Capillary: 116 mg/dL — ABNORMAL HIGH (ref 70–99)

## 2021-12-12 LAB — LIPID PANEL
Cholesterol: 200 mg/dL (ref 0–200)
HDL: 62 mg/dL (ref >40)
LDL Cholesterol: 112 mg/dL — ABNORMAL HIGH (ref 0–99)
Total CHOL/HDL Ratio: 3.2 RATIO
Triglycerides: 131 mg/dL (ref <150)
VLDL: 26 mg/dL (ref 0–40)

## 2021-12-12 LAB — C DIFFICILE QUICK SCREEN W PCR REFLEX
C Diff antigen: POSITIVE — AB
C Diff toxin: NEGATIVE

## 2021-12-12 LAB — TROPONIN I (HIGH SENSITIVITY)
Troponin I (High Sensitivity): 40 ng/L — ABNORMAL HIGH (ref <18)
Troponin I (High Sensitivity): 35 ng/L — ABNORMAL HIGH (ref <18)

## 2021-12-12 LAB — RESP PANEL BY RT-PCR (FLU A&B, COVID) ARPGX2
Influenza A by PCR: NEGATIVE
Influenza B by PCR: NEGATIVE
SARS Coronavirus 2 by RT PCR: NEGATIVE

## 2021-12-12 LAB — LIPASE, BLOOD: Lipase: 96 U/L — ABNORMAL HIGH (ref 11–51)

## 2021-12-12 LAB — PROCALCITONIN: Procalcitonin: 0.82 ng/mL

## 2021-12-12 LAB — APTT: aPTT: 32 seconds (ref 24–36)

## 2021-12-12 LAB — SEDIMENTATION RATE: Sed Rate: 42 mm/hr — ABNORMAL HIGH (ref 0–30)

## 2021-12-12 LAB — CLOSTRIDIUM DIFFICILE BY PCR, REFLEXED: Toxigenic C. Difficile by PCR: NEGATIVE

## 2021-12-12 LAB — LACTIC ACID, PLASMA: Lactic Acid, Venous: 1.4 mmol/L (ref 0.5–1.9)

## 2021-12-12 MED ORDER — ACETAMINOPHEN 325 MG PO TABS
650.0000 mg | ORAL_TABLET | Freq: Four times a day (QID) | ORAL | Status: DC | PRN
  Administered 2021-12-13 – 2021-12-21 (×8): 650 mg via ORAL
  Filled 2021-12-12 (×7): qty 2

## 2021-12-12 MED ORDER — SODIUM CHLORIDE 0.9 % IV BOLUS (SEPSIS)
500.0000 mL | Freq: Once | INTRAVENOUS | Status: AC
  Administered 2021-12-12: 500 mL via INTRAVENOUS

## 2021-12-12 MED ORDER — SEVELAMER CARBONATE 800 MG PO TABS
800.0000 mg | ORAL_TABLET | Freq: Three times a day (TID) | ORAL | Status: DC
  Administered 2021-12-12 – 2021-12-24 (×32): 800 mg via ORAL
  Filled 2021-12-12 (×33): qty 1

## 2021-12-12 MED ORDER — SODIUM CHLORIDE 0.9 % IV SOLN
1.0000 g | INTRAVENOUS | Status: DC
  Administered 2021-12-13 – 2021-12-16 (×4): 1 g via INTRAVENOUS
  Filled 2021-12-12 (×5): qty 10

## 2021-12-12 MED ORDER — DICLOFENAC SODIUM 1 % EX GEL
2.0000 g | Freq: Four times a day (QID) | CUTANEOUS | Status: DC | PRN
  Filled 2021-12-12: qty 100

## 2021-12-12 MED ORDER — HEPARIN SODIUM (PORCINE) 5000 UNIT/ML IJ SOLN
5000.0000 [IU] | Freq: Three times a day (TID) | INTRAMUSCULAR | Status: DC
Start: 2021-12-12 — End: 2021-12-24
  Administered 2021-12-12 – 2021-12-24 (×35): 5000 [IU] via SUBCUTANEOUS
  Filled 2021-12-12 (×36): qty 1

## 2021-12-12 MED ORDER — VANCOMYCIN HCL IN DEXTROSE 1-5 GM/200ML-% IV SOLN: 1000.0000 mg | Freq: Once | INTRAVENOUS | Status: DC

## 2021-12-12 MED ORDER — LEVOTHYROXINE SODIUM 50 MCG PO TABS
125.0000 ug | ORAL_TABLET | Freq: Every day | ORAL | Status: DC
  Administered 2021-12-13 – 2021-12-24 (×11): 125 ug via ORAL
  Filled 2021-12-12 (×12): qty 1

## 2021-12-12 MED ORDER — ALBUTEROL SULFATE (2.5 MG/3ML) 0.083% IN NEBU: 3.0000 mL | INHALATION_SOLUTION | RESPIRATORY_TRACT | Status: DC | PRN

## 2021-12-12 MED ORDER — INSULIN ASPART 100 UNIT/ML IJ SOLN: 0.0000 [IU] | Freq: Every day | INTRAMUSCULAR | Status: DC

## 2021-12-12 MED ORDER — ASPIRIN 81 MG PO TBEC
81.0000 mg | DELAYED_RELEASE_TABLET | Freq: Every day | ORAL | Status: DC
  Administered 2021-12-12 – 2021-12-24 (×12): 81 mg via ORAL
  Filled 2021-12-12 (×12): qty 1

## 2021-12-12 MED ORDER — HYDRALAZINE HCL 20 MG/ML IJ SOLN: 5.0000 mg | INTRAMUSCULAR | Status: DC | PRN

## 2021-12-12 MED ORDER — IOHEXOL 300 MG/ML  SOLN
100.0000 mL | Freq: Once | INTRAMUSCULAR | Status: DC | PRN
Start: 2021-12-12 — End: 2021-12-24

## 2021-12-12 MED ORDER — ACETAMINOPHEN 500 MG PO TABS
1000.0000 mg | ORAL_TABLET | Freq: Once | ORAL | Status: AC
  Administered 2021-12-12: 1000 mg via ORAL
  Filled 2021-12-12: qty 2

## 2021-12-12 MED ORDER — PANCRELIPASE (LIP-PROT-AMYL) 12000-38000 UNITS PO CPEP
12000.0000 [IU] | ORAL_CAPSULE | Freq: Three times a day (TID) | ORAL | Status: DC
Start: 2021-12-12 — End: 2021-12-24
  Administered 2021-12-12 – 2021-12-24 (×33): 12000 [IU] via ORAL
  Filled 2021-12-12 (×38): qty 1

## 2021-12-12 MED ORDER — VANCOMYCIN HCL 1500 MG/300ML IV SOLN
1500.0000 mg | Freq: Once | INTRAVENOUS | Status: AC
  Administered 2021-12-12: 1500 mg via INTRAVENOUS
  Filled 2021-12-12 (×2): qty 300

## 2021-12-12 MED ORDER — ATORVASTATIN CALCIUM 20 MG PO TABS
20.0000 mg | ORAL_TABLET | Freq: Every day | ORAL | Status: DC
  Administered 2021-12-12 – 2021-12-24 (×12): 20 mg via ORAL
  Filled 2021-12-12 (×12): qty 1

## 2021-12-12 MED ORDER — CHLORHEXIDINE GLUCONATE CLOTH 2 % EX PADS
6.0000 | MEDICATED_PAD | Freq: Every day | CUTANEOUS | Status: DC
  Administered 2021-12-12 – 2021-12-24 (×13): 6 via TOPICAL

## 2021-12-12 MED ORDER — OXYCODONE-ACETAMINOPHEN 5-325 MG PO TABS
1.0000 | ORAL_TABLET | ORAL | Status: DC | PRN
  Administered 2021-12-14 – 2021-12-16 (×2): 1 via ORAL
  Filled 2021-12-12 (×4): qty 1

## 2021-12-12 MED ORDER — INSULIN ASPART 100 UNIT/ML IJ SOLN
0.0000 [IU] | Freq: Three times a day (TID) | INTRAMUSCULAR | Status: DC
  Administered 2021-12-13 (×2): 1 [IU] via SUBCUTANEOUS
  Administered 2021-12-14: 2 [IU] via SUBCUTANEOUS
  Administered 2021-12-15 – 2021-12-16 (×2): 1 [IU] via SUBCUTANEOUS
  Administered 2021-12-20: 2 [IU] via SUBCUTANEOUS
  Administered 2021-12-20 – 2021-12-22 (×2): 1 [IU] via SUBCUTANEOUS
  Filled 2021-12-12 (×9): qty 1

## 2021-12-12 MED ORDER — METRONIDAZOLE 500 MG/100ML IV SOLN
500.0000 mg | Freq: Two times a day (BID) | INTRAVENOUS | Status: DC
  Administered 2021-12-12 – 2021-12-16 (×8): 500 mg via INTRAVENOUS
  Filled 2021-12-12 (×9): qty 100

## 2021-12-12 MED ORDER — LOSARTAN POTASSIUM 50 MG PO TABS
50.0000 mg | ORAL_TABLET | Freq: Every day | ORAL | Status: DC
  Administered 2021-12-12 – 2021-12-15 (×4): 50 mg via ORAL
  Filled 2021-12-12 (×4): qty 1

## 2021-12-12 MED ORDER — ONDANSETRON HCL 4 MG/2ML IJ SOLN
4.0000 mg | Freq: Three times a day (TID) | INTRAMUSCULAR | Status: DC | PRN
  Administered 2021-12-19: 4 mg via INTRAVENOUS
  Filled 2021-12-12 (×2): qty 2

## 2021-12-12 MED ORDER — SODIUM CHLORIDE 0.9 % IV SOLN
2.0000 g | Freq: Once | INTRAVENOUS | Status: AC
  Administered 2021-12-12: 2 g via INTRAVENOUS
  Filled 2021-12-12: qty 10

## 2021-12-12 MED ORDER — DM-GUAIFENESIN ER 30-600 MG PO TB12: 1.0000 | ORAL_TABLET | Freq: Two times a day (BID) | ORAL | Status: DC | PRN

## 2021-12-12 MED ORDER — METRONIDAZOLE 500 MG/100ML IV SOLN
500.0000 mg | Freq: Once | INTRAVENOUS | Status: AC
  Administered 2021-12-12: 500 mg via INTRAVENOUS
  Filled 2021-12-12: qty 100

## 2021-12-12 MED ORDER — FLUTICASONE PROPIONATE 50 MCG/ACT NA SUSP: 1.0000 | Freq: Two times a day (BID) | NASAL | Status: DC | PRN

## 2021-12-12 NOTE — Assessment & Plan Note (Deleted)
Patient has stage IV sacral wound with infection, CT scan cannot completely rule out osteomyelitis. -Patient is on vancomycin, cefepime and Flagyl -CRP is negative -ESR is elevated at 42 -General surgery has consulted and states that there is no need for I &D.  -Wound care consult pending. -Offload coccyx as possible. -Blood culture has had no growth

## 2021-12-12 NOTE — Progress Notes (Signed)
CODE SEPSIS - PHARMACY COMMUNICATION  **Broad Spectrum Antibiotics should be administered within 1 hour of Sepsis diagnosis**  Time Code Sepsis Called/Page Received:  8/19 @ 0353   Antibiotics Ordered: Vanc, Aztreonam   Time of 1st antibiotic administration:  Aztreonam 2 gm IV X 1  Additional action taken by pharmacy:  Spoke with Jenny Reichmann in ED , pt was hard stick so delay in getting IV access  If necessary, Name of Provider/Nurse Contacted: Glendell Docker D ,PharmD Clinical Pharmacist  12/12/2021  7:10 AM

## 2021-12-12 NOTE — TOC Progression Note (Signed)
Transition of Care [facility]) - Progression Note    Patient Details  Name: Tonya Myers MRN: [id number] Date of Birth: 11-26-1965  Transition of Care [facility]) [name] [place], [name] Phone Number: 12/12/2021, 10:19 AM  Clinical Narrative:    Patient discharged yesterday with [facility]. CSW notified [name] with [facility] of patient being readmitted.         Expected Discharge Plan and Services                                                 Social Determinants of Health (SDOH) Interventions    Readmission Risk Interventions    11/24/2020    2:17 PM  Readmission Risk Prevention Plan  Transportation Screening Complete  PCP or Specialist Appt within 3-5 Days Complete  HRI or [place] Complete  Social Work Consult for [place] Planning/Counseling Complete  Palliative Care Screening Not Applicable  Medication Review Press photographer) Complete

## 2021-12-12 NOTE — ED Provider Notes (Signed)
[facility] Provider Note    Event Date/Time   First MD Initiated Contact with Patient 12/12/21 0340     (approximate)   History   Weakness (Pt arrived via EMS from home for complaints of gen weakness/nausea. Pt goes to dialysis mon, wed, fri, last dialized this past Friday w/ no issues. Pt reports similar sx's on Wednesday during dialysis and states she received CPR from staff while fully alert until EMS arrived and stopped them. Pt has heart monitor in place. ) and Nausea   HPI  Tonya Myers is a 56 y.o. female with history of end-stage renal disease on hemodialysis, CLL, hypertension, diabetes, hypothyroidism who presents to the emergency department with complaints of generalized weakness, lightheadedness, chills, nausea, diarrhea and slight shortness of breath.  She denies chest pain, abdominal pain, fever.  She was just discharged from the emergency department.  She has recently been septic she states that when she started feeling poorly again tonight she became concerned.  She states she has recently been on antibiotics for UTI.  She is also had multiple recent hospitalizations.  No known sick contacts.  No missed dialysis sessions recently.  History provided by patient, family, EMS.    Past Medical History:  Diagnosis Date   Acute hemorrhoid 03/11/2015   Anxiety    Arthritis    Asthma    Cancer ([place])    Chronic back pain    CLL (chronic lymphocytic leukemia) ([place])    Depression    Diabetes mellitus without complication ([place])    Genital herpes    type 2   Hypertension    Hypothyroidism    Vertigo     Past Surgical History:  Procedure Laterality Date   ABDOMINAL HYSTERECTOMY     BILATERAL SALPINGOOPHORECTOMY  2009   BREAST BIOPSY Right 05/17/2016   FIBROADENOMATOUS CHANGE AND SCLEROSING ADENOSIS WITH   COLONOSCOPY WITH PROPOFOL N/A 06/16/2015   Procedure: COLONOSCOPY WITH PROPOFOL;  Surgeon: [name], MD;  Location: [facility]  ENDOSCOPY;  Service: Endoscopy;  Laterality: N/A;   DIALYSIS/PERMA CATHETER INSERTION N/A 12/17/2020   Procedure: DIALYSIS/PERMA CATHETER INSERTION;  Surgeon: Algernon Huxley, MD;  Location: [facility];  Service: Cardiovascular;  Laterality: N/A;   LAPAROSCOPIC SUPRACERVICAL HYSTERECTOMY  2009   due to [place] N/A 12/10/2020   Procedure: TEMPORARY DIALYSIS CATHETER;  Surgeon: Katha Cabal, MD;  Location: [facility];  Service: Cardiovascular;  Laterality: N/A;   THORACIC LAMINECTOMY FOR EPIDURAL ABSCESS Bilateral 06/17/2020   Procedure: THORACIC LAMINECTOMY FOR EPIDURAL ABSCESS;  Surgeon: Deetta Perla, MD;  Location: [facility] ORS;  Service: Neurosurgery;  Laterality: Bilateral;    MEDICATIONS:  Prior to Admission medications   Medication Sig Start Date End Date Taking? Authorizing Provider  acetaminophen (TYLENOL) 325 MG tablet Take 2 tablets (650 mg total) by mouth every 6 (six) hours as needed for mild pain (or Fever >/= 101). 07/30/20   Angiulli, Lavon Paganini, PA-C  albuterol (VENTOLIN HFA) 108 (90 Base) MCG/ACT inhaler Inhale 2 puffs into the lungs every 6 (six) hours as needed for wheezing or shortness of breath. 10/18/21   [provider]  amLODipine (NORVASC) 5 MG tablet Take 1 tablet (5 mg total) by mouth daily. Patient not taking: Reported on 12/09/2021 12/22/20   Little Ishikawa, MD  aspirin EC 81 MG tablet Take 1 tablet (81 mg total) by mouth daily. Swallow whole. Patient not taking: Reported on  12/09/2021 10/02/20   Max Sane, MD  atorvastatin (LIPITOR) 20 MG tablet Take 20 mg by mouth daily. 10/18/21   [provider]  ciprofloxacin (CIPRO) 250 MG tablet Take 1 tablet (250 mg total) by mouth daily with breakfast for 5 doses. 12/12/21 12/17/21  Nolberto Hanlon, MD  Continuous Blood Gluc Sensor (DEXCOM G6 SENSOR) MISC SMARTSIG:1 Each Topical Every 10 Days 08/12/20   [provider]  diclofenac Sodium  (VOLTAREN) 1 % GEL Apply 2 g topically 4 (four) times daily. 07/30/20   Angiulli, Lavon Paganini, PA-C  fluticasone (FLONASE) 50 MCG/ACT nasal spray 1 spray 2 (two) times daily.    [provider]  insulin lispro (HUMALOG) 100 UNIT/ML KwikPen CBG < 70: Take glucose/juice/candy as directed CBG 70 - 120: 0 units CBG 121 - 150: 2 units CBG 151 - 200: 3 units CBG 201 - 250: 5 units CBG 251 - 300: 8 units CBG 301 - 350: 11 units CBG 351 - 400: 15 units CBG > 400: 15 units and recheck in 1 hour - if still >400 repeat 15 units and call PCP 12/22/20   Little Ishikawa, MD  levothyroxine (SYNTHROID) 125 MCG tablet Take 125 mcg by mouth daily. 12/08/21   [provider]  sevelamer carbonate (RENVELA) 800 MG tablet Take 800 mg by mouth 3 (three) times daily. 12/08/21   [provider]  budesonide-formoterol (SYMBICORT) 160-4.5 MCG/ACT inhaler Inhale into the lungs. 07/14/18 03/15/19  [provider]    Physical Exam   Triage Vital Signs: ED Triage Vitals  Enc Vitals Group     BP 12/12/21 0344 (!) 150/85     Pulse Rate 12/12/21 0344 (!) 54     Resp 12/12/21 0344 16     Temp 12/12/21 0348 100 F (37.8 C)     Temp Source 12/12/21 0348 Rectal     SpO2 12/12/21 0344 99 %     Weight --      Height --      Head Circumference --      Peak Flow --      Pain Score --      Pain Loc --      Pain Edu? --      Excl. in [place]? --     Most recent vital signs: Vitals:   12/12/21 0500 12/12/21 0824  BP: (!) 146/91 (!) 151/80  Pulse: (!) 54 60  Resp: (!) 23 18  Temp:  98.3 F (36.8 C)  SpO2: 100% 100%    CONSTITUTIONAL: Alert and oriented and responds appropriately to questions.  Chronically ill-appearing, rectal temp of 100 HEAD: Normocephalic, atraumatic EYES: Conjunctivae clear, pupils appear equal, sclera nonicteric ENT: normal nose; moist mucous membranes NECK: Supple, normal ROM CARD: RRR; S1 and S2 appreciated; no murmurs, no clicks, no rubs, no gallops;  tunneled dialysis catheter in the right chest wall without surrounding redness, warmth, bleeding or drainage RESP: Normal chest excursion without splinting or tachypnea; breath sounds clear and equal bilaterally; no wheezes, no rhonchi, no rales, no hypoxia or respiratory distress, speaking full sentences ABD/GI: Normal bowel sounds; non-distended; soft, non-tender, no rebound, no guarding, no peritoneal signs, patient has a large sacral decubitus ulcer with some soft tissue swelling but no significant redness, warmth, drainage or bleeding.  She has a large amount of loose brown stool in her depends.  No blood seen in the stool.  No melena. BACK: The back appears normal EXT: Normal ROM in all joints; no deformity noted, no  edema; no cyanosis SKIN: Normal color for age and race; warm; no rash on exposed skin NEURO: Moves all extremities equally, normal speech PSYCH: The patient's mood and manner are appropriate.   ED Results / Procedures / Treatments   LABS: (all labs ordered are listed, but only abnormal results are displayed) Labs Reviewed  C DIFFICILE QUICK SCREEN W PCR REFLEX   - Abnormal; Notable for the following components:      Result Value   C Diff antigen POSITIVE (*)    All other components within normal limits  COMPREHENSIVE METABOLIC PANEL - Abnormal; Notable for the following components:   Creatinine, Ser 3.31 (*)    Total Protein 6.1 (*)    Albumin 3.3 (*)    GFR, Estimated 16 (*)    All other components within normal limits  CBC WITH DIFFERENTIAL/PLATELET - Abnormal; Notable for the following components:   RBC 3.05 (*)    Hemoglobin 9.1 (*)    HCT 29.0 (*)    RDW 18.2 (*)    All other components within normal limits  LIPASE, BLOOD - Abnormal; Notable for the following components:   Lipase 96 (*)    All other components within normal limits  SEDIMENTATION RATE - Abnormal; Notable for the following components:   Sed Rate 42 (*)    All other components within normal  limits  RESP PANEL BY RT-PCR (FLU A&B, COVID) ARPGX2  GASTROINTESTINAL PANEL BY PCR, STOOL (REPLACES STOOL CULTURE)  CLOSTRIDIUM DIFFICILE BY PCR, REFLEXED  CULTURE, BLOOD (ROUTINE X 2)  CULTURE, BLOOD (ROUTINE X 2)  URINE CULTURE  LACTIC ACID, PLASMA  PROTIME-INR  APTT  PROCALCITONIN  URINALYSIS, COMPLETE (UACMP) WITH MICROSCOPIC  LIPID PANEL  C-REACTIVE PROTEIN  CBG MONITORING, ED  TROPONIN I (HIGH SENSITIVITY)     EKG:  EKG Interpretation  Date/Time:  Saturday December 12 2021 04:11:52 EDT Ventricular Rate:  52 PR Interval:  139 QRS Duration: 86 QT Interval:  433 QTC Calculation: 403 R Axis:   -48 Text Interpretation: Sinus rhythm Left anterior fascicular block Anteroseptal infarct, age indeterminate Confirmed by Pryor Curia [phone]) on 12/12/2021 6:31:24 AM         RADIOLOGY: My personal review and interpretation of imaging: CT scan concerning for colitis.  I have personally reviewed all radiology reports.   CT CHEST ABDOMEN PELVIS WO CONTRAST  Result Date: 12/12/2021 CLINICAL DATA:  Sepsis. EXAM: CT CHEST, ABDOMEN AND PELVIS WITHOUT CONTRAST TECHNIQUE: Multidetector CT imaging of the chest, abdomen and pelvis was performed following the standard protocol without IV contrast. RADIATION DOSE REDUCTION: This exam was performed according to the departmental dose-optimization program which includes automated exposure control, adjustment of the mA and/or kV according to patient size and/or use of iterative reconstruction technique. COMPARISON:  11/21/2020 FINDINGS: CT CHEST FINDINGS Cardiovascular: The heart size appears upper limits of normal. Small pericardial effusion. Aortic and coronary artery atherosclerotic calcifications. Mediastinum/Nodes: Thyroid gland, trachea and esophagus demonstrate no significant findings. No enlarged axillary or mediastinal lymph nodes. Lungs/Pleura: Small right pleural effusion. Atelectasis versus scar identified within the left lung base. No  signs of interstitial edema or airspace consolidation. Nodule within the periphery of the right upper lobe measures 6 mm, image 62/4. On the previous exam this measured 7 mm. Tiny nodule in the periphery of the right upper lobe is stable measuring 3 mm, image 52/4. Musculoskeletal: There is a new fracture deformity involving the proximal body of sternum, image 91/6 which may be related to recent cardiopulmonary resuscitation. No displaced  rib fractures identified. Postsurgical changes from laminectomies identified within the thoracic spine at T3-4, T5-7 and T9-10 levels. CT ABDOMEN PELVIS FINDINGS Hepatobiliary: Intermediate attenuating structure within the posterior right lobe of liver measures 2 cm. This is indeterminate but based on previous imaging favored to represent a benign hemangioma. Adjacent low-density structure, not seen previously measures 7 mm and is technically too small to reliably characterize. Gallbladder appears normal. Tiny stone within the gallbladder is identified. No gallbladder wall thickening or inflammation. Pancreas: There are scattered calcifications within the pancreatic parenchyma consistent with chronic pancreatitis. No main duct dilatation or inflammation identified. Spleen: Normal in size without focal abnormality. Adrenals/Urinary Tract: Normal adrenal glands. No nephrolithiasis or hydronephrosis identified. No hydroureter or ureteral lithiasis. The bladder is partially decompressed with mild diffuse wall thickening. Stomach/Bowel: Stomach appears normal. The appendix is visualized and appears normal. No small bowel wall thickening, inflammation or distension. There is a large volume of retained desiccated stool identified within the rectum. Perirectal fat stranding is identified. Findings may reflect underlying stercoral colitis. Vascular/Lymphatic: Aortic atherosclerosis. No aneurysm. No signs of abdominopelvic adenopathy. Reproductive: Status post hysterectomy. No adnexal  masses. Other: No free fluid or fluid collections. Musculoskeletal: There has been interval development of abnormal presacral increased soft tissue which measures 2 cm in thickness, new from the previous exam. No fluid collection identified within this area. New decubitus ulceration is identified overlying the coccyx with large area of subcutaneous increase soft tissue infiltration, image 116/2, image 94/5 and image 77/4. There is a small punctate focus of gas within this area, image 116/2. The increased soft tissue infiltration extends up to the coccyx which appears more sclerotic compared with the previous exam, image 114/2 and image 86/5. Cannot rule out underlying chronic osteomyelitis. No focal bone erosion or fragmentation noted. Degenerative disc disease is identified at L5-S1., IMPRESSION: 1. New decubitus ulceration overlying the coccyx with large area of subcutaneous increase soft tissue infiltration. Small focus of gas identified in this area. The increased soft tissue infiltration extends up to the coccyx which appears more sclerotic compared with the previous exam. Cannot rule out underlying chronic osteomyelitis. 2. Interval development of abnormal presacral soft tissue thickening which is concerning for underlying or infection. 3. Large volume of retained desiccated stool identified within the rectum with mild surrounding fat stranding. Correlate for any clinical signs or symptoms of rectal impaction and possible stercoral colitis. 4. New fracture deformity involves the proximal body of sternum. 5. Small right pleural effusion. 6. Chronic pancreatitis. 7. Nodule within the periphery of the right upper lobe measures 6 mm. This is stable compared with study from 11/21/2020. Future CT at 18-24 months (from 10/22/2020) is considered optional for low-risk patients, but is recommended for high-risk patients. This recommendation follows the consensus statement: Guidelines for Management of Incidental  Pulmonary Nodules Detected on CT Images:From the Fleischner Society 2017; published online before print (10.1148/radiol.[id number]). 8. Aortic Atherosclerosis (ICD10-I70.0). Electronically Signed   By: Kerby Moors M.D.   On: 12/12/2021 07:07   DG Chest Port 1 View  Result Date: 12/12/2021 CLINICAL DATA:  56 year old female with possible sepsis. EXAM: PORTABLE CHEST 1 VIEW COMPARISON:  Portable chest 12/04/2020 and earlier. FINDINGS: Portable AP semi upright view at 0418 hours. Right PICC line removed and new dual lumen right chest dialysis type catheter. No adverse features. Improved lung volumes and bibasilar ventilation. No pneumothorax. Allowing for portable technique the lungs are clear. Cervical or device projects over the left heart border today, presumed external artifact. Cardiac and  mediastinal contours are within normal limits. Visualized tracheal air column is within normal limits. No acute osseous abnormality identified. Paucity of bowel gas. IMPRESSION: No acute cardiopulmonary abnormality. Right chest dialysis type catheter placed with no adverse features. Electronically Signed   By: Genevie Ann M.D.   On: 12/12/2021 05:41     PROCEDURES:  Critical Care performed: No   CRITICAL CARE Performed by: Cyril Mourning Sandralee Tarkington   Total critical care time: 0 minutes  Critical care time was exclusive of separately billable procedures and treating other patients.  Critical care was necessary to treat or prevent imminent or life-threatening deterioration.  Critical care was time spent personally by me on the following activities: development of treatment plan with patient and/or surrogate as well as nursing, discussions with consultants, evaluation of patient's response to treatment, examination of patient, obtaining history from patient or surrogate, ordering and performing treatments and interventions, ordering and review of laboratory studies, ordering and review of radiographic studies, pulse  oximetry and re-evaluation of patient's condition.   Marland Kitchen1-3 Lead EKG Interpretation  Performed by: Vianka Ertel, Delice Bison, DO Authorized by: Blimi Godby, Delice Bison, DO     Interpretation: normal     ECG rate:  60   ECG rate assessment: normal     Rhythm: sinus rhythm     Ectopy: none     Conduction: normal       IMPRESSION / MDM / ASSESSMENT AND PLAN / ED COURSE  I reviewed the triage vital signs and the nursing notes.    Patient with complaints of nausea, diarrhea, chills, weakness and lightheadedness.  Rectal temp of 100 here.  The patient is on the cardiac monitor to evaluate for evidence of arrhythmia and/or significant heart rate changes.   DIFFERENTIAL DIAGNOSIS (includes but not limited to):   Colitis, diverticulitis, appendicitis, less likely bowel obstruction or perforation.  Also concern for developing sepsis, bacteremia, pneumonia.  Low suspicion for ACS, PE.  She does not appear volume overloaded but is at risk for pulmonary edema.  Also given recent antibiotic use she is also at risk for C. difficile.   Patient's presentation is most consistent with acute presentation with potential threat to life or bodily function.   PLAN: We will go ahead and cover with broad-spectrum antibiotics given patient has been septic before and she is high risk being on dialysis.  Rectal temp of 100 but hemodynamically stable.  Will obtain CBC, CMP, procalcitonin, lactic, cultures, urinalysis, CT of the chest, abdomen pelvis.  She is refusing IV contrast although she has been on dialysis for 2 years.  She states she still makes urine and reports she has been recently treated for a UTI.  We will also obtain stool studies.   MEDICATIONS GIVEN IN ED: Medications  iohexol (OMNIPAQUE) 300 MG/ML solution 100 mL ( Intravenous Canceled Entry 12/12/21 0608)  insulin aspart (novoLOG) injection 0-9 Units ( Subcutaneous Not Given 12/12/21 0806)  insulin aspart (novoLOG) injection 0-5 Units (has no administration in  time range)  albuterol (PROVENTIL) (2.5 MG/3ML) 0.083% nebulizer solution 3 mL (has no administration in time range)  dextromethorphan-guaiFENesin (MUCINEX DM) 30-600 MG per 12 hr tablet 1 tablet (has no administration in time range)  oxyCODONE-acetaminophen (PERCOCET/ROXICET) 5-325 MG per tablet 1 tablet (has no administration in time range)  acetaminophen (TYLENOL) tablet 650 mg (has no administration in time range)  ondansetron (ZOFRAN) injection 4 mg (has no administration in time range)  hydrALAZINE (APRESOLINE) injection 5 mg (has no administration in time range)  heparin  injection 5,000 Units (5,000 Units Subcutaneous Given 12/12/21 0944)  metroNIDAZOLE (FLAGYL) IVPB 500 mg (has no administration in time range)  ceFEPIme (MAXIPIME) 1 g in sodium chloride 0.9 % 100 mL IVPB (has no administration in time range)  lipase/protease/amylase (CREON) capsule 12,000 Units (has no administration in time range)  aspirin EC tablet 81 mg (has no administration in time range)  atorvastatin (LIPITOR) tablet 20 mg (has no administration in time range)  losartan (COZAAR) tablet 50 mg (has no administration in time range)  levothyroxine (SYNTHROID) tablet 125 mcg (has no administration in time range)  sevelamer carbonate (RENVELA) tablet 800 mg (has no administration in time range)  fluticasone (FLONASE) 50 MCG/ACT nasal spray 1 spray (has no administration in time range)  diclofenac Sodium (VOLTAREN) 1 % topical gel 2 g (has no administration in time range)  aztreonam (AZACTAM) 2 g in sodium chloride 0.9 % 100 mL IVPB (0 g Intravenous Stopped 12/12/21 0712)  metroNIDAZOLE (FLAGYL) IVPB 500 mg (0 mg Intravenous Stopped 12/12/21 0835)  sodium chloride 0.9 % bolus 500 mL (0 mLs Intravenous Stopped 12/12/21 0719)  acetaminophen (TYLENOL) tablet 1,000 mg (1,000 mg Oral Given 12/12/21 0555)  vancomycin (VANCOREADY) IVPB 1500 mg/300 mL (0 mg Intravenous Stopped 12/12/21 1023)     ED COURSE: Patient has received  broad-spectrum antibiotics, Tylenol and remains hemodynamically stable.  Labs show no leukocytosis.,  Stable chronic kidney disease.  No electrolyte derangement.  Normal lactic.  Procalcitonin elevated at 0.82.  CT of the chest, abdomen pelvis reviewed and interpreted by myself and the radiologist and shows new decubitus ulceration overlying the coccyx with large area of subcutaneous soft tissue infiltration.  She has sclerosis of the coccyx that is more than compared to previous exam and they cannot rule out underlying chronic osteomyelitis.  She also has abnormal presacral soft tissue thickening which is concerning for possible underlying infection.  She does not have any overlying redness, warmth or tenderness at this area but she is getting covered with antibiotics.  CT scan also shows stool within the rectum with fat stranding and could be possible stercoral colitis.  She does not have any fecal impaction on my exam.    We will admit to the hospital for concerns for colitis, diarrhea and possible developing infection around her sacral decubitus ulcer.   Patient's stool studies show that she is positive for C. difficile.     CONSULTS:  Consulted and discussed patient's case with hospitalist, Dr. Blaine Hamper.  I have recommended admission and consulting physician agrees and will place admission orders.  Patient (and family if present) agree with this plan.   I reviewed all nursing notes, vitals, pertinent previous records.  All labs, EKGs, imaging ordered have been independently reviewed and interpreted by myself.    OUTSIDE RECORDS REVIEWED: Reviewed patient's recent admissions in August 2023.       FINAL CLINICAL IMPRESSION(S) / ED DIAGNOSES   Final diagnoses:  Pressure injury of skin of sacral region, unspecified injury stage  C. difficile colitis  Generalized weakness     Rx / DC Orders   ED Discharge Orders     None        Note:  This document was prepared using Dragon voice  recognition software and may include unintentional dictation errors.   Daziyah Cogan, Delice Bison, DO 12/12/21 1240

## 2021-12-12 NOTE — Assessment & Plan Note (Addendum)
Continue Cozaar and hydralazine

## 2021-12-12 NOTE — Assessment & Plan Note (Addendum)
Possible urinary tract infection.  Patient completed antibiotic course.

## 2021-12-12 NOTE — ED Notes (Signed)
Patient transported to CT 

## 2021-12-12 NOTE — ED Notes (Signed)
Patient is very difficult stick w/ limited access due to dialysis; after multiple failed attempts from multiple RN's, unable to obtain blood cultures.  Lab notified to obtain both sets of blood cultures at this time.

## 2021-12-12 NOTE — Assessment & Plan Note (Addendum)
Continue Synthroid °

## 2021-12-12 NOTE — Assessment & Plan Note (Signed)
Stable -Bronchodilators 

## 2021-12-12 NOTE — Assessment & Plan Note (Addendum)
Patient was started on Creon low-dose.  Lipase has been slightly elevated even in the past.

## 2021-12-12 NOTE — Progress Notes (Signed)
Pt being followed by [facility] for Sepsis protocol. 

## 2021-12-12 NOTE — Consult Note (Addendum)
/  Pharmacy Antibiotic Note  Tonya Myers is a 56 y.o. female admitted on 12/12/2021 with sepsis. PMH significant for ESRD on HD (MWF), anxiety, asthma, CLL, depression, T2DM, HTN and hypothyroidism. Patient also has a history of MDR pseudomonas UTI/bacteremia in 11/2020. Patient was discharged from [facility] 12/10/2021 following admission for syncope + elevated troponin on 8/16. Code sepsis was called 12/12/2021 @ 0353 and first antibiotics administration was delayed until 0622 due to issues establishing IV access. Patient has listed allergy to PCN, but the nature of the reaction is unclear and patient has tolerated penicillins and beta-lactams in the past. 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 Pharmacy has been consulted for vancomycin and cefepime dosing.  Plan: Give loading dose of Vancomycin 1500 mg IV x1 followed by 750 mg IV after HD sessions. Goal vanc level 15-25 mcg/mL.  Switch from aztreonam 2g Q24H to cefepime 1g Q24H.     Temp (24hrs), Avg:98.7 F (37.1 C), Min:97.7 F (36.5 C), Max:100 F (37.8 C)  Recent Labs  Lab 12/09/21 1548 12/10/21 0527 12/11/21 0614 12/12/21 0546  WBC 4.4 6.3 3.5* 4.2  CREATININE 2.94* 3.75* 5.69* 3.31*  LATICACIDVEN  --   --   --  1.4    Estimated Creatinine Clearance: 19.2 mL/min (A) (by C-G formula based on SCr of 3.31 mg/dL (H)).    Allergies  Allergen Reactions   Amoxapine    Penicillins    Ceftazidime     Other reaction(s): Confusion, Hallucination, Unknown Encephalopathy - improved after dialysis/some concern for cephalosporin neurotoxicity Tolerated without confusion 06/2021. Encephalopathy - improved after dialysis/some concern for cephalosporin neurotoxicity Tolerated without confusion 06/2021.     Antimicrobials this admission: 12/12/2021 Aztreonam >> 12/13/2021 Cefepime >> 12/12/2021 Metronidazole >>  12/12/2021 Vancomycin >>   Dose adjustments this  admission: N/A  Microbiology results: 12/12/2021 BCx: ordered 12/12/2021 UCx: ordered   Thank you for allowing pharmacy to be a part of this patient's care.  Gretel Acre, PharmD PGY1 Pharmacy Resident 12/12/2021 8:17 AM

## 2021-12-12 NOTE — Assessment & Plan Note (Signed)
-   Pain control: As needed Percocet and Tylenol -Incentive spirometry

## 2021-12-12 NOTE — Assessment & Plan Note (Addendum)
Received 5 days of antibiotics.  Stool studies negative.

## 2021-12-12 NOTE — Progress Notes (Signed)
[facility]  ROUNDING NOTE   Subjective:   Tonya Myers is a 56 year old female with past medical history including depression, type 2 diabetes, asthma, hypertension, hypothyroidism, CLL, and end-stage renal disease on hemodialysis.   Patient returns to the emergency room about 10 hours after being discharged yesterday.  Her son is in the room with her. She reports that after she got home, she ate hot dog.  And then started to have nausea, abdominal discomfort.  She was sitting up at night.  She also reports lightheadedness, generalized weakness, some shortness of breath, nausea and diarrhea.  Was able to eat oatmeal, [place] toast in the emergency room. C. difficile antigen is. CT of the chest abdomen and pelvis shows decubitus ulceration over coccyx,  interval development of abnormal presacral soft tissue thickening concerning for underlying infection, large volume of retained/desiccated stool. Tolerated her dialysis yesterday.   Objective:  Vital signs in last 24 hours:  Temp:  [97.7 F (36.5 C)-100 F (37.8 C)] 98.3 F (36.8 C) (08/19 0824) Pulse Rate:  [54-73] 60 (08/19 0824) Resp:  [9-24] 18 (08/19 0824) BP: (122-163)/(74-91) 151/80 (08/19 0824) SpO2:  [98 %-100 %] 100 % (08/19 0824)  Weight change:  There were no vitals filed for this visit.   Intake/Output: No intake/output data recorded.   Intake/Output this shift:  Total I/O In: 1000 [IV Piggyback:1000] Out: -   Physical Exam: General: NAD  Head: Normocephalic, atraumatic. Moist oral mucosal membranes  Eyes: Anicteric  Lungs:  Clear to auscultation, normal effort  Heart: Regular rate and rhythm  Abdomen:  Soft, nontender  Extremities:  trace peripheral edema.  Neurologic: Nonfocal, moving all four extremities  Skin: No lesions  Access: Rt Permcath    Basic Metabolic Panel: Recent Labs  Lab 12/09/21 1548 12/09/21 2329 12/10/21 0527 12/10/21 1044 12/11/21 0614 12/12/21 0546  NA 137   --  138  --  139 139  K 3.3*  --  3.4*  --  3.9 4.1  CL 101  --  104  --  106 102  CO2 25  --  27  --  24 26  GLUCOSE 121*  --  83  --  80 82  BUN 16  --  25*  --  39* 20  CREATININE 2.94*  --  3.75*  --  5.69* 3.31*  CALCIUM 8.0*  --  8.3*  --  9.0 9.2  MG  --  1.9  --  2.1  --   --      Liver Function Tests: Recent Labs  Lab 12/12/21 0546  AST 34  ALT 25  ALKPHOS 69  BILITOT 0.7  PROT 6.1*  ALBUMIN 3.3*   Recent Labs  Lab 12/12/21 0546  LIPASE 96*   No results for input(s): "AMMONIA" in the last 168 hours.  CBC: Recent Labs  Lab 12/09/21 1548 12/10/21 0527 12/11/21 0614 12/12/21 0546  WBC 4.4 6.3 3.5* 4.2  NEUTROABS 1.3*  --   --  1.7  HGB 8.7* 8.0* 7.9* 9.1*  HCT 28.0* 25.6* 24.8* 29.0*  MCV 94.6 95.5 95.4 95.1  PLT 184 196 182 206     Cardiac Enzymes: No results for input(s): "CKTOTAL", "CKMB", "CKMBINDEX", "TROPONINI" in the last 168 hours.  BNP: Invalid input(s): "POCBNP"  CBG: Recent Labs  Lab 12/10/21 1719 12/10/21 1929 12/11/21 0844 12/11/21 1215 12/12/21 0804  GLUCAP 106* 120* 82 172* 34     Microbiology: Results for orders placed or performed during the hospital encounter of  12/12/21  Resp Panel by RT-PCR (Flu A&B, Covid) Anterior Nasal Swab     Status: None   Collection Time: 12/12/21  4:28 AM   Specimen: Anterior Nasal Swab  Result Value Ref Range Status   SARS Coronavirus 2 by RT PCR NEGATIVE NEGATIVE Final    Comment: (NOTE) SARS-CoV-2 target nucleic acids are NOT DETECTED.  The SARS-CoV-2 RNA is generally detectable in upper respiratory specimens during the acute phase of infection. The lowest concentration of SARS-CoV-2 viral copies this assay can detect is 138 copies/mL. A negative result does not preclude SARS-Cov-2 infection and should not be used as the sole basis for treatment or other patient management decisions. A negative result may occur with  improper specimen collection/handling, submission of specimen  other than nasopharyngeal swab, presence of viral mutation(s) within the areas targeted by this assay, and inadequate number of viral copies(<138 copies/mL). A negative result must be combined with clinical observations, patient history, and epidemiological information. The expected result is Negative.  Fact Sheet for Patients:  EntrepreneurPulse.com.au  Fact Sheet for Healthcare Providers:  IncredibleEmployment.be  This test is no t yet approved or cleared by the [place] FDA and  has been authorized for detection and/or diagnosis of SARS-CoV-2 by FDA under an Emergency Use Authorization (EUA). This EUA will remain  in effect (meaning this test can be used) for the duration of the COVID-19 declaration under Section 564(b)(1) of the Act, 21 U.S.C.section 360bbb-3(b)(1), unless the authorization is terminated  or revoked sooner.       Influenza A by PCR NEGATIVE NEGATIVE Final   Influenza B by PCR NEGATIVE NEGATIVE Final    Comment: (NOTE) The Xpert Xpress SARS-CoV-2/FLU/RSV plus assay is intended as an aid in the diagnosis of influenza from Nasopharyngeal swab specimens and should not be used as a sole basis for treatment. Nasal washings and aspirates are unacceptable for Xpert Xpress SARS-CoV-2/FLU/RSV testing.  Fact Sheet for Patients: EntrepreneurPulse.com.au  Fact Sheet for Healthcare Providers: IncredibleEmployment.be  This test is not yet approved or cleared by the [place] FDA and has been authorized for detection and/or diagnosis of SARS-CoV-2 by FDA under an Emergency Use Authorization (EUA). This EUA will remain in effect (meaning this test can be used) for the duration of the COVID-19 declaration under Section 564(b)(1) of the Act, 21 U.S.C. section 360bbb-3(b)(1), unless the authorization is terminated or revoked.  Performed at [facility], [address]  [place], [place] [address]   Gastrointestinal Panel by PCR , Stool     Status: None   Collection Time: 12/12/21  8:26 AM   Specimen: Stool  Result Value Ref Range Status   Campylobacter species NOT DETECTED NOT DETECTED Final   Plesimonas shigelloides NOT DETECTED NOT DETECTED Final   Salmonella species NOT DETECTED NOT DETECTED Final   Yersinia enterocolitica NOT DETECTED NOT DETECTED Final   Vibrio species NOT DETECTED NOT DETECTED Final   Vibrio cholerae NOT DETECTED NOT DETECTED Final   Enteroaggregative E coli (EAEC) NOT DETECTED NOT DETECTED Final   Enteropathogenic E coli (EPEC) NOT DETECTED NOT DETECTED Final   Enterotoxigenic E coli (ETEC) NOT DETECTED NOT DETECTED Final   Shiga like toxin producing E coli (STEC) NOT DETECTED NOT DETECTED Final   Shigella/Enteroinvasive E coli (EIEC) NOT DETECTED NOT DETECTED Final   Cryptosporidium NOT DETECTED NOT DETECTED Final   Cyclospora cayetanensis NOT DETECTED NOT DETECTED Final   Entamoeba histolytica NOT DETECTED NOT DETECTED Final   Giardia lamblia NOT DETECTED NOT DETECTED  Final   Adenovirus F40/41 NOT DETECTED NOT DETECTED Final   Astrovirus NOT DETECTED NOT DETECTED Final   Norovirus GI/GII NOT DETECTED NOT DETECTED Final   Rotavirus A NOT DETECTED NOT DETECTED Final   Sapovirus (I, II, IV, and V) NOT DETECTED NOT DETECTED Final    Comment: Performed at [facility], [place], [place] [address] [address]  C Difficile Quick Screen w PCR reflex     Status: Abnormal   Collection Time: 12/12/21  8:26 AM   Specimen: STOOL  Result Value Ref Range Status   C Diff antigen POSITIVE (A) NEGATIVE Final   C Diff toxin NEGATIVE NEGATIVE Final   C Diff interpretation Results are indeterminate. See PCR results.  Final    Comment: Performed at [facility], [address] [place], [place] [address]  C. Diff by PCR, Reflexed     Status: None   Collection Time: 12/12/21  8:26 AM  Result Value Ref Range Status    Toxigenic C. Difficile by PCR NEGATIVE NEGATIVE Final    Comment: Patient is colonized with non toxigenic C. difficile. May not need treatment unless significant symptoms are present. Performed at [facility], [address] [place], [place] [address]     Coagulation Studies: Recent Labs    12/10/21 0130 12/12/21 0546  LABPROT 14.0 13.2  INR 1.1 1.0     Urinalysis: Recent Labs    12/10/21 2100  COLORURINE YELLOW*  LABSPEC 1.008  PHURINE 9.0*  GLUCOSEU NEGATIVE  HGBUR NEGATIVE  BILIRUBINUR NEGATIVE  KETONESUR NEGATIVE  PROTEINUR 100*  NITRITE NEGATIVE  LEUKOCYTESUR LARGE*       Imaging: CT CHEST ABDOMEN PELVIS WO CONTRAST  Result Date: 12/12/2021 CLINICAL DATA:  Sepsis. EXAM: CT CHEST, ABDOMEN AND PELVIS WITHOUT CONTRAST TECHNIQUE: Multidetector CT imaging of the chest, abdomen and pelvis was performed following the standard protocol without IV contrast. RADIATION DOSE REDUCTION: This exam was performed according to the departmental dose-optimization program which includes automated exposure control, adjustment of the mA and/or kV according to patient size and/or use of iterative reconstruction technique. COMPARISON:  11/21/2020 FINDINGS: CT CHEST FINDINGS Cardiovascular: The heart size appears upper limits of normal. Small pericardial effusion. Aortic and coronary artery atherosclerotic calcifications. Mediastinum/Nodes: Thyroid gland, trachea and esophagus demonstrate no significant findings. No enlarged axillary or mediastinal lymph nodes. Lungs/Pleura: Small right pleural effusion. Atelectasis versus scar identified within the left lung base. No signs of interstitial edema or airspace consolidation. Nodule within the periphery of the right upper lobe measures 6 mm, image 62/4. On the previous exam this measured 7 mm. Tiny nodule in the periphery of the right upper lobe is stable measuring 3 mm, image 52/4. Musculoskeletal: There is a new fracture deformity  involving the proximal body of sternum, image 91/6 which may be related to recent cardiopulmonary resuscitation. No displaced rib fractures identified. Postsurgical changes from laminectomies identified within the thoracic spine at T3-4, T5-7 and T9-10 levels. CT ABDOMEN PELVIS FINDINGS Hepatobiliary: Intermediate attenuating structure within the posterior right lobe of liver measures 2 cm. This is indeterminate but based on previous imaging favored to represent a benign hemangioma. Adjacent low-density structure, not seen previously measures 7 mm and is technically too small to reliably characterize. Gallbladder appears normal. Tiny stone within the gallbladder is identified. No gallbladder wall thickening or inflammation. Pancreas: There are scattered calcifications within the pancreatic parenchyma consistent with chronic pancreatitis. No main duct dilatation or inflammation identified. Spleen: Normal in size without focal abnormality. Adrenals/Urinary  Tract: Normal adrenal glands. No nephrolithiasis or hydronephrosis identified. No hydroureter or ureteral lithiasis. The bladder is partially decompressed with mild diffuse wall thickening. Stomach/Bowel: Stomach appears normal. The appendix is visualized and appears normal. No small bowel wall thickening, inflammation or distension. There is a large volume of retained desiccated stool identified within the rectum. Perirectal fat stranding is identified. Findings may reflect underlying stercoral colitis. Vascular/Lymphatic: Aortic atherosclerosis. No aneurysm. No signs of abdominopelvic adenopathy. Reproductive: Status post hysterectomy. No adnexal masses. Other: No free fluid or fluid collections. Musculoskeletal: There has been interval development of abnormal presacral increased soft tissue which measures 2 cm in thickness, new from the previous exam. No fluid collection identified within this area. New decubitus ulceration is identified overlying the coccyx with  large area of subcutaneous increase soft tissue infiltration, image 116/2, image 94/5 and image 77/4. There is a small punctate focus of gas within this area, image 116/2. The increased soft tissue infiltration extends up to the coccyx which appears more sclerotic compared with the previous exam, image 114/2 and image 86/5. Cannot rule out underlying chronic osteomyelitis. No focal bone erosion or fragmentation noted. Degenerative disc disease is identified at L5-S1., IMPRESSION: 1. New decubitus ulceration overlying the coccyx with large area of subcutaneous increase soft tissue infiltration. Small focus of gas identified in this area. The increased soft tissue infiltration extends up to the coccyx which appears more sclerotic compared with the previous exam. Cannot rule out underlying chronic osteomyelitis. 2. Interval development of abnormal presacral soft tissue thickening which is concerning for underlying or infection. 3. Large volume of retained desiccated stool identified within the rectum with mild surrounding fat stranding. Correlate for any clinical signs or symptoms of rectal impaction and possible stercoral colitis. 4. New fracture deformity involves the proximal body of sternum. 5. Small right pleural effusion. 6. Chronic pancreatitis. 7. Nodule within the periphery of the right upper lobe measures 6 mm. This is stable compared with study from 11/21/2020. Future CT at 18-24 months (from 10/22/2020) is considered optional for low-risk patients, but is recommended for high-risk patients. This recommendation follows the consensus statement: Guidelines for Management of Incidental Pulmonary Nodules Detected on CT Images:From the Fleischner Society 2017; published online before print (10.1148/radiol.[id number]). 8. Aortic Atherosclerosis (ICD10-I70.0). Electronically Signed   By: Kerby Moors M.D.   On: 12/12/2021 07:07   DG Chest Port 1 View  Result Date: 12/12/2021 CLINICAL DATA:  56 year old female  with possible sepsis. EXAM: PORTABLE CHEST 1 VIEW COMPARISON:  Portable chest 12/04/2020 and earlier. FINDINGS: Portable AP semi upright view at 0418 hours. Right PICC line removed and new dual lumen right chest dialysis type catheter. No adverse features. Improved lung volumes and bibasilar ventilation. No pneumothorax. Allowing for portable technique the lungs are clear. Cervical or device projects over the left heart border today, presumed external artifact. Cardiac and mediastinal contours are within normal limits. Visualized tracheal air column is within normal limits. No acute osseous abnormality identified. Paucity of bowel gas. IMPRESSION: No acute cardiopulmonary abnormality. Right chest dialysis type catheter placed with no adverse features. Electronically Signed   By: Genevie Ann M.D.   On: 12/12/2021 05:41     Medications:    [START ON 12/13/2021] ceFEPime (MAXIPIME) IV     metronidazole       aspirin EC  81 mg Oral Daily   atorvastatin  20 mg Oral Daily   heparin  5,000 Units Subcutaneous Q8H   insulin aspart  0-5 Units Subcutaneous QHS  insulin aspart  0-9 Units Subcutaneous TID WC   [START ON 12/13/2021] levothyroxine  125 mcg Oral Q0600   lipase/protease/amylase  12,000 Units Oral TID WC   losartan  50 mg Oral Daily   sevelamer carbonate  800 mg Oral TID WC   acetaminophen, albuterol, dextromethorphan-guaiFENesin, diclofenac Sodium, fluticasone, hydrALAZINE, iohexol, ondansetron (ZOFRAN) IV, oxyCODONE-acetaminophen  Assessment/ Plan:  Ms. Courtnay Petrilla is a 56 y.o.  female with past medical history including depression, type 2 diabetes, asthma, hypertension, hypothyroidism, CLL, and end-stage renal disease on hemodialysis.  [facility]/MWF/right PermCath  end-stage renal disease on hemodialysis.    -Patient will be set up for routine dialysis on Monday.  2. Anemia of chronic kidney disease Lab Results  Component Value Date   HGB 9.1 (L) 12/12/2021   We will  continue to monitor.  Patient receives [place] outpatient.  3. Secondary Hyperparathyroidism: with outpatient labs: PTH 235, phosphorus 6.7, calcium 9.0 on 11/30/2021.   Lab Results  Component Value Date   CALCIUM 9.2 12/12/2021   PHOS 3.2 11/28/2020    We will continue to monitor bone minerals during this admission.  4. 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 Home regimen includes lispro. Most recent hemoglobin A1c is 4.9 on 12/09/2021.   Glucose stable.  5.  Syncopal episode during dialysis treatment.  Cardiology consulted due to elevated troponins overnight.  Echo shows EF 55 to 60%.  Cardiac cath was considered however history in favor of vagal episode.  Patient refusing stress test, cardiology to proceed with conservative measures including outpatient heart monitor for 30 days.  Currently has a monitor in place.  6.  Nausea/sacral wound/retained stool/C. difficile positive. Management as per internal medicine team.  Currently has received aztreonam, cefepime, metronidazole and vancomycin IV -If enema is considered, avoid fleets enema due to high phosphorus content.     LOS: 0 Anayia Eugene 8/19/20231:02 PM

## 2021-12-12 NOTE — H&P (Signed)
History and Physical    Tonya Myers WVP:[id number] DOB: [birth year] DOA: 12/12/2021  Referring MD/NP/PA:   PCP: Tonya Pink, MD   Patient coming from:  The patient is coming from home.  At baseline, pt is independent for most of ADL.        Chief Complaint: weakness, fever, diarrhea, dysuria   HPI: Tonya Myers is a 56 y.o. 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   Pt was recently hospitalized from 8/16 - 8/18 due to syncope.  Per report, dialysis staff performed 2 minutes of CPR prior to arrival to Ed. Pt had elevated trop up to  452.  Cardiology was consulted, suspected that her syncopal episode was vasovagal/hypotensive in nature, and troponin elevation was demand ischemia in that setting. 2 D echo on 8/17 showed LVEF of 55-60% without WMA's or other valvular pathologies. Pt also has had UTI and was started on Cipro at the discharge.   Patient states that after she went home, she has worsening weakness.  She has fever and chills.  She continues to have symptoms of UTI, including dysuria, burning on urination and urinary frequency.  She also reports nausea, diarrhea and mild lower abdominal pain.  No vomiting.  Patient has had 5 times of diarrhea.  Patient states that after she had CPR, she has central chest wall tenderness.  No shortness breath or cough.  Patient had dialysis on last Friday.   Data reviewed independently and ED Course: pt was found to have troponin level 257 (452 on 12/10/2021), WBC 4.2, lactic acid 1.4, INR 1.0, PTT 32, negative COVID PCR, lipase 92, normal liver function, potassium 4.1,  bicarbonate of 26, creatinine 3.31, BUN 20.  Temperature 100.  Blood pressure 146/91, heart rate 52, RR 24, oxygen saturation 100% on room air.  Chest x-ray negative for infiltration.  Patient is admitted to telemetry bed as inpatient.  CT-chest  showed: 1. New decubitus ulceration overlying the coccyx with large area of subcutaneous increase soft tissue infiltration. Small focus of gas identified in this area. The increased soft tissue infiltration extends up to the coccyx which appears more sclerotic compared with the previous exam. Cannot rule out underlying chronic osteomyelitis. 2. Interval development of abnormal presacral soft tissue thickening which is concerning for underlying or infection. 3. Large volume of retained desiccated stool identified within the rectum with mild surrounding fat stranding. Correlate for any clinical signs or symptoms of rectal impaction and possible stercoral colitis. 4. New fracture deformity involves the proximal body of sternum. 5. Small right pleural effusion. 6. Chronic pancreatitis. 7. Nodule within the periphery of the right upper lobe measures 6 mm. This is stable compared with study from 11/21/2020. Future CT at 18-24 months (from 10/22/2020) is considered optional for low-risk patients, but is recommended for high-risk patients. This recommendation follows the consensus statement: Guidelines for Management of Incidental Pulmonary Nodules Detected on CT Images:From the Fleischner Society 2017; published online before print (10.1148/[id number]). 8. Aortic Atherosclerosis (ICD10-I70.0).    EKG: I have personally reviewed.  Sinus rhythm, QTc 403, LAD, poor R wave progression, mild T wave inversion in V3-V6.  Review of Systems:   General: no fevers, chills, no body  weight gain, has fatigue HEENT: no blurry vision, hearing changes or sore throat Respiratory: no dyspnea, coughing, wheezing CV: has front chest wall tenderness, no  palpitations GI: has nausea, no vomiting, has lower abdominal pain, diarrhea, np constipation GU: no dysuria, burning on urination, increased urinary frequency, hematuria  Ext: no leg edema Neuro: no vision change or hearing loss. Chronic weakness in both legs and right arm Skin: Has sacral ulcer MSK: No muscle spasm, no deformity, no limitation of range of movement in spin Heme: No easy bruising.  Travel history: No recent long distant travel.   Allergy:  Allergies  Allergen Reactions   Amoxapine    Penicillins    Ceftazidime     Other reaction(s): Confusion, Hallucination, Unknown Encephalopathy - improved after dialysis/some concern for cephalosporin neurotoxicity Tolerated without confusion 06/2021. Encephalopathy - improved after dialysis/some concern for cephalosporin neurotoxicity Tolerated without confusion 06/2021.     Past Medical History:  Diagnosis Date   Acute hemorrhoid 03/11/2015   Anxiety    Arthritis    Asthma    Cancer ([place])    Chronic back pain    CLL (chronic lymphocytic leukemia) ([place])    Depression    Diabetes mellitus without complication ([place])    Genital herpes    type 2   Hypertension    Hypothyroidism    Vertigo     Past Surgical History:  Procedure Laterality Date   ABDOMINAL HYSTERECTOMY     BILATERAL SALPINGOOPHORECTOMY  2009   BREAST BIOPSY Right 05/17/2016   FIBROADENOMATOUS CHANGE AND SCLEROSING ADENOSIS WITH   COLONOSCOPY WITH PROPOFOL N/A 06/16/2015   Procedure: COLONOSCOPY WITH PROPOFOL;  Surgeon: [name], MD;  Location: [facility] ENDOSCOPY;  Service: Endoscopy;  Laterality: N/A;   DIALYSIS/PERMA CATHETER INSERTION N/A 12/17/2020   Procedure: DIALYSIS/PERMA CATHETER INSERTION;  Surgeon: Algernon Huxley, MD;  Location: [place] [facility];  Service: Cardiovascular;  Laterality: N/A;   LAPAROSCOPIC SUPRACERVICAL HYSTERECTOMY  2009   due to [place] N/A 12/10/2020   Procedure: TEMPORARY  DIALYSIS CATHETER;  Surgeon: Katha Cabal, MD;  Location: [facility];  Service: Cardiovascular;  Laterality: N/A;   THORACIC LAMINECTOMY FOR EPIDURAL ABSCESS Bilateral 06/17/2020   Procedure: THORACIC LAMINECTOMY FOR EPIDURAL ABSCESS;  Surgeon: Deetta Perla, MD;  Location: [facility] ORS;  Service: Neurosurgery;  Laterality: Bilateral;    Social History:  reports that she has never smoked. She has never used smokeless tobacco. She reports current alcohol use of about 1.0 standard drink of alcohol per week. She reports that she does not use drugs.  Family History:  Family History  Problem Relation Age of Onset   Cancer Paternal Aunt    Breast cancer Maternal Aunt 70     Prior to Admission medications   Medication Sig Start Date End Date Taking? Authorizing Provider  acetaminophen (TYLENOL) 325 MG tablet Take 2 tablets (650 mg total) by mouth every 6 (six) hours as needed for mild pain (or Fever >/= 101). 07/30/20   Angiulli, Lavon Paganini, PA-C  albuterol (VENTOLIN HFA) 108 (90 Base) MCG/ACT inhaler Inhale 2 puffs into the lungs every 6 (six) hours as needed for wheezing or shortness of breath. 10/18/21   [provider]  amLODipine (NORVASC) 5 MG tablet Take 1 tablet (5 mg total) by mouth daily. Patient not taking: Reported on 12/09/2021 12/22/20   Little Ishikawa, MD  aspirin EC 81 MG tablet Take 1 tablet (81 mg total)  by mouth daily. Swallow whole. Patient not taking: Reported on 12/09/2021 10/02/20   Max Sane, MD  atorvastatin (LIPITOR) 20 MG tablet Take 20 mg by mouth daily. 10/18/21   [provider]  ciprofloxacin (CIPRO) 250 MG tablet Take 1 tablet (250 mg total) by mouth daily with breakfast for 5 doses. 12/12/21 12/17/21  Nolberto Hanlon, MD  Continuous Blood Gluc Sensor (DEXCOM G6 SENSOR) MISC SMARTSIG:1 Each Topical Every 10 Days 08/12/20   [provider]  diclofenac Sodium (VOLTAREN) 1 % GEL Apply 2 g topically 4 (four) times daily. 07/30/20   Angiulli,  Lavon Paganini, PA-C  fluticasone (FLONASE) 50 MCG/ACT nasal spray 1 spray 2 (two) times daily.    [provider]  insulin lispro (HUMALOG) 100 UNIT/ML KwikPen CBG < 70: Take glucose/juice/candy as directed CBG 70 - 120: 0 units CBG 121 - 150: 2 units CBG 151 - 200: 3 units CBG 201 - 250: 5 units CBG 251 - 300: 8 units CBG 301 - 350: 11 units CBG 351 - 400: 15 units CBG > 400: 15 units and recheck in 1 hour - if still >400 repeat 15 units and call PCP 12/22/20   Little Ishikawa, MD  levothyroxine (SYNTHROID) 125 MCG tablet Take 125 mcg by mouth daily. 12/08/21   [provider]  sevelamer carbonate (RENVELA) 800 MG tablet Take 800 mg by mouth 3 (three) times daily. 12/08/21   [provider]  budesonide-formoterol (SYMBICORT) 160-4.5 MCG/ACT inhaler Inhale into the lungs. 07/14/18 03/15/19  [provider]    Physical Exam: Vitals:   12/12/21 0344 12/12/21 0348 12/12/21 0500 12/12/21 0824  BP: (!) 150/85  (!) 146/91 (!) 151/80  Pulse: (!) 54  (!) 54 60  Resp: 16  (!) 23 18  Temp:  100 F (37.8 C)  98.3 F (36.8 C)  TempSrc:  Rectal  Oral  SpO2: 99%  100% 100%   General: Not in acute distress HEENT:       Eyes: PERRL, EOMI, no scleral icterus.       ENT: No discharge from the ears and nose, no pharynx injection, no tonsillar enlargement.        Neck: No JVD, no bruit, no mass felt. Heme: No neck lymph node enlargement. Cardiac: S1/S2, RRR, No murmurs, No gallops or rubs. Respiratory: No rales, wheezing, rhonchi or rubs. GI: Soft, nondistended, has mild tenderness in lower abdomen, no rebound pain, no organomegaly, BS present. GU: No hematuria Ext: No pitting leg edema bilaterally. 1+DP/PT pulse bilaterally. Musculoskeletal: No joint deformities, No joint redness or warmth, no limitation of ROM in spin.  Has front chest wall tenderness. Skin: Has a sacral ulcer, stage IV, with some pus drainage on surface,no significant surrounding  erythema     Neuro: Alert, oriented X3, cranial nerves II-XII grossly intact, Chronic weakness in both legs and right arm psych: Patient is not psychotic, no suicidal or hemocidal ideation.  Labs on Admission: I have personally reviewed following labs and imaging studies  CBC: Recent Labs  Lab 12/09/21 1548 12/10/21 0527 12/11/21 0614 12/12/21 0546  WBC 4.4 6.3 3.5* 4.2  NEUTROABS 1.3*  --   --  1.7  HGB 8.7* 8.0* 7.9* 9.1*  HCT 28.0* 25.6* 24.8* 29.0*  MCV 94.6 95.5 95.4 95.1  PLT 184 196 182 578   Basic Metabolic Panel: Recent Labs  Lab 12/09/21 1548 12/09/21 2329 12/10/21 0527 12/10/21 1044 12/11/21 0614 12/12/21 0546  NA 137  --  138  --  139  139  K 3.3*  --  3.4*  --  3.9 4.1  CL 101  --  104  --  106 102  CO2 25  --  27  --  24 26  GLUCOSE 121*  --  83  --  80 82  BUN 16  --  25*  --  39* 20  CREATININE 2.94*  --  3.75*  --  5.69* 3.31*  CALCIUM 8.0*  --  8.3*  --  9.0 9.2  MG  --  1.9  --  2.1  --   --    GFR: Estimated Creatinine Clearance: 19.2 mL/min (A) (by C-G formula based on SCr of 3.31 mg/dL (H)). Liver Function Tests: Recent Labs  Lab 12/12/21 0546  AST 34  ALT 25  ALKPHOS 69  BILITOT 0.7  PROT 6.1*  ALBUMIN 3.3*   Recent Labs  Lab 12/12/21 0546  LIPASE 96*   No results for input(s): "AMMONIA" in the last 168 hours. Coagulation Profile: Recent Labs  Lab 12/10/21 0130 12/12/21 0546  INR 1.1 1.0   Cardiac Enzymes: No results for input(s): "CKTOTAL", "CKMB", "CKMBINDEX", "TROPONINI" in the last 168 hours. BNP (last 3 results) No results for input(s): "PROBNP" in the last 8760 hours. HbA1C: Recent Labs    12/09/21 1548  HGBA1C 4.9   CBG: Recent Labs  Lab 12/10/21 1719 12/10/21 1929 12/11/21 0844 12/11/21 1215 12/12/21 0804  GLUCAP 106* 120* 82 172* 74   Lipid Profile: No results for input(s): "CHOL", "HDL", "LDLCALC", "TRIG", "CHOLHDL", "LDLDIRECT" in the last 72 hours. Thyroid Function Tests: No results for  input(s): "TSH", "T4TOTAL", "FREET4", "T3FREE", "THYROIDAB" in the last 72 hours. Anemia Panel: No results for input(s): "VITAMINB12", "FOLATE", "FERRITIN", "TIBC", "IRON", "RETICCTPCT" in the last 72 hours. Urine analysis:    Component Value Date/Time   COLORURINE YELLOW (A) 12/10/2021 2100   APPEARANCEUR HAZY (A) 12/10/2021 2100   LABSPEC 1.008 12/10/2021 2100   PHURINE 9.0 (H) 12/10/2021 2100   GLUCOSEU NEGATIVE 12/10/2021 2100   HGBUR NEGATIVE 12/10/2021 2100   [place] NEGATIVE 12/10/2021 2100   [place] NEGATIVE 12/10/2021 2100   PROTEINUR 100 (A) 12/10/2021 2100   NITRITE NEGATIVE 12/10/2021 2100   LEUKOCYTESUR LARGE (A) 12/10/2021 2100   Sepsis Labs: _0 (procalcitonin:4,lacticidven:4) ) Recent Results (from the past 240 hour(s))  Urine Culture     Status: Abnormal   Collection Time: 12/10/21  9:00 PM   Specimen: Urine, Clean Catch  Result Value Ref Range Status   Specimen Description   Final    URINE, CLEAN CATCH Performed at [facility], [address] [place], [place] [address]    Special Requests   Final    NONE Performed at [facility], [address], [place], [place] [address]    Culture (A)  Final    <10,000 COLONIES/mL INSIGNIFICANT GROWTH Performed at [facility], [place] [address] [place], [place] [address]    Report Status 12/12/2021 FINAL  Final  Resp Panel by RT-PCR (Flu A&B, Covid) Anterior Nasal Swab     Status: None   Collection Time: 12/12/21  4:28 AM   Specimen: Anterior Nasal Swab  Result Value Ref Range Status   SARS Coronavirus 2 by RT PCR NEGATIVE NEGATIVE Final    Comment: (NOTE) SARS-CoV-2 target nucleic acids are NOT DETECTED.  The SARS-CoV-2 RNA is generally detectable in upper respiratory specimens during the acute phase of infection. The lowest concentration of SARS-CoV-2 viral copies this assay can detect is 138  copies/mL. A negative result does not preclude SARS-Cov-2 infection and should  not be used as the sole basis for treatment or other patient management decisions. A negative result may occur with  improper specimen collection/handling, submission of specimen other than nasopharyngeal swab, presence of viral mutation(s) within the areas targeted by this assay, and inadequate number of viral copies(<138 copies/mL). A negative result must be combined with clinical observations, patient history, and epidemiological information. The expected result is Negative.  Fact Sheet for Patients:  EntrepreneurPulse.com.au  Fact Sheet for Healthcare Providers:  IncredibleEmployment.be  This test is no t yet approved or cleared by the [place] FDA and  has been authorized for detection and/or diagnosis of SARS-CoV-2 by FDA under an Emergency Use Authorization (EUA). This EUA will remain  in effect (meaning this test can be used) for the duration of the COVID-19 declaration under Section 564(b)(1) of the Act, 21 U.S.C.section 360bbb-3(b)(1), unless the authorization is terminated  or revoked sooner.       Influenza A by PCR NEGATIVE NEGATIVE Final   Influenza B by PCR NEGATIVE NEGATIVE Final    Comment: (NOTE) The Xpert Xpress SARS-CoV-2/FLU/RSV plus assay is intended as an aid in the diagnosis of influenza from Nasopharyngeal swab specimens and should not be used as a sole basis for treatment. Nasal washings and aspirates are unacceptable for Xpert Xpress SARS-CoV-2/FLU/RSV testing.  Fact Sheet for Patients: EntrepreneurPulse.com.au  Fact Sheet for Healthcare Providers: IncredibleEmployment.be  This test is not yet approved or cleared by the [place] FDA and has been authorized for detection and/or diagnosis of SARS-CoV-2 by FDA under an Emergency Use Authorization (EUA). This EUA will remain in effect (meaning this test can be used) for the duration of the COVID-19 declaration under  Section 564(b)(1) of the Act, 21 U.S.C. section 360bbb-3(b)(1), unless the authorization is terminated or revoked.  Performed at [facility], [address] [place], [place] [address]   Gastrointestinal Panel by PCR , Stool     Status: None   Collection Time: 12/12/21  8:26 AM   Specimen: Stool  Result Value Ref Range Status   Campylobacter species NOT DETECTED NOT DETECTED Final   Plesimonas shigelloides NOT DETECTED NOT DETECTED Final   Salmonella species NOT DETECTED NOT DETECTED Final   Yersinia enterocolitica NOT DETECTED NOT DETECTED Final   Vibrio species NOT DETECTED NOT DETECTED Final   Vibrio cholerae NOT DETECTED NOT DETECTED Final   Enteroaggregative E coli (EAEC) NOT DETECTED NOT DETECTED Final   Enteropathogenic E coli (EPEC) NOT DETECTED NOT DETECTED Final   Enterotoxigenic E coli (ETEC) NOT DETECTED NOT DETECTED Final   Shiga like toxin producing E coli (STEC) NOT DETECTED NOT DETECTED Final   Shigella/Enteroinvasive E coli (EIEC) NOT DETECTED NOT DETECTED Final   Cryptosporidium NOT DETECTED NOT DETECTED Final   Cyclospora cayetanensis NOT DETECTED NOT DETECTED Final   Entamoeba histolytica NOT DETECTED NOT DETECTED Final   Giardia lamblia NOT DETECTED NOT DETECTED Final   Adenovirus F40/41 NOT DETECTED NOT DETECTED Final   Astrovirus NOT DETECTED NOT DETECTED Final   Norovirus GI/GII NOT DETECTED NOT DETECTED Final   Rotavirus A NOT DETECTED NOT DETECTED Final   Sapovirus (I, II, IV, and V) NOT DETECTED NOT DETECTED Final    Comment: Performed at [facility], [address], [place], [place] [address]  C Difficile Quick Screen w PCR reflex     Status: Abnormal   Collection Time: 12/12/21  8:26 AM   Specimen: STOOL  Result Value Ref Range Status   C Diff antigen POSITIVE (A) NEGATIVE Final   C Diff toxin NEGATIVE NEGATIVE Final   C Diff interpretation Results are indeterminate. See PCR results.  Final    Comment: Performed at [facility], [address] [place], [place] [address]  C. Diff by PCR, Reflexed     Status: None   Collection Time: 12/12/21  8:26 AM  Result Value Ref Range Status   Toxigenic C. Difficile by PCR NEGATIVE NEGATIVE Final    Comment: Patient is colonized with non toxigenic C. difficile. May not need treatment unless significant symptoms are present. Performed at [facility], [address] [place], [place] [address]      Radiological Exams on Admission: CT CHEST ABDOMEN PELVIS WO CONTRAST  Result Date: 12/12/2021 CLINICAL DATA:  Sepsis. EXAM: CT CHEST, ABDOMEN AND PELVIS WITHOUT CONTRAST TECHNIQUE: Multidetector CT imaging of the chest, abdomen and pelvis was performed following the standard protocol without IV contrast. RADIATION DOSE REDUCTION: This exam was performed according to the departmental dose-optimization program which includes automated exposure control, adjustment of the mA and/or kV according to patient size and/or use of iterative reconstruction technique. COMPARISON:  11/21/2020 FINDINGS: CT CHEST FINDINGS Cardiovascular: The heart size appears upper limits of normal. Small pericardial effusion. Aortic and coronary artery atherosclerotic calcifications. Mediastinum/Nodes: Thyroid gland, trachea and esophagus demonstrate no significant findings. No enlarged axillary or mediastinal lymph nodes. Lungs/Pleura: Small right pleural effusion. Atelectasis versus scar identified within the left lung base. No signs of interstitial edema or airspace consolidation. Nodule within the periphery of the right upper lobe measures 6 mm, image 62/4. On the previous exam this measured 7 mm. Tiny nodule in the periphery of the right upper lobe is stable measuring 3 mm, image 52/4. Musculoskeletal: There is a new fracture deformity involving the proximal body of sternum, image 91/6 which may be related to recent cardiopulmonary resuscitation. No displaced rib fractures identified. Postsurgical  changes from laminectomies identified within the thoracic spine at T3-4, T5-7 and T9-10 levels. CT ABDOMEN PELVIS FINDINGS Hepatobiliary: Intermediate attenuating structure within the posterior right lobe of liver measures 2 cm. This is indeterminate but based on previous imaging favored to represent a benign hemangioma. Adjacent low-density structure, not seen previously measures 7 mm and is technically too small to reliably characterize. Gallbladder appears normal. Tiny stone within the gallbladder is identified. No gallbladder wall thickening or inflammation. Pancreas: There are scattered calcifications within the pancreatic parenchyma consistent with chronic pancreatitis. No main duct dilatation or inflammation identified. Spleen: Normal in size without focal abnormality. Adrenals/Urinary Tract: Normal adrenal glands. No nephrolithiasis or hydronephrosis identified. No hydroureter or ureteral lithiasis. The bladder is partially decompressed with mild diffuse wall thickening. Stomach/Bowel: Stomach appears normal. The appendix is visualized and appears normal. No small bowel wall thickening, inflammation or distension. There is a large volume of retained desiccated stool identified within the rectum. Perirectal fat stranding is identified. Findings may reflect underlying stercoral colitis. Vascular/Lymphatic: Aortic atherosclerosis. No aneurysm. No signs of abdominopelvic adenopathy. Reproductive: Status post hysterectomy. No adnexal masses. Other: No free fluid or fluid collections. Musculoskeletal: There has been interval development of abnormal presacral increased soft tissue which measures 2 cm in thickness, new from the previous exam. No fluid collection identified within this area. New decubitus ulceration is identified overlying the coccyx with large area of subcutaneous increase soft tissue infiltration, image 116/2, image 94/5 and image 77/4. There is a small punctate focus of gas within this  area, image  116/2. The increased soft tissue infiltration extends up to the coccyx which appears more sclerotic compared with the previous exam, image 114/2 and image 86/5. Cannot rule out underlying chronic osteomyelitis. No focal bone erosion or fragmentation noted. Degenerative disc disease is identified at L5-S1., IMPRESSION: 1. New decubitus ulceration overlying the coccyx with large area of subcutaneous increase soft tissue infiltration. Small focus of gas identified in this area. The increased soft tissue infiltration extends up to the coccyx which appears more sclerotic compared with the previous exam. Cannot rule out underlying chronic osteomyelitis. 2. Interval development of abnormal presacral soft tissue thickening which is concerning for underlying or infection. 3. Large volume of retained desiccated stool identified within the rectum with mild surrounding fat stranding. Correlate for any clinical signs or symptoms of rectal impaction and possible stercoral colitis. 4. New fracture deformity involves the proximal body of sternum. 5. Small right pleural effusion. 6. Chronic pancreatitis. 7. Nodule within the periphery of the right upper lobe measures 6 mm. This is stable compared with study from 11/21/2020. Future CT at 18-24 months (from 10/22/2020) is considered optional for low-risk patients, but is recommended for high-risk patients. This recommendation follows the consensus statement: Guidelines for Management of Incidental Pulmonary Nodules Detected on CT Images:From the Fleischner Society 2017; published online before print (10.1148/radiol.[id number]). 8. Aortic Atherosclerosis (ICD10-I70.0). Electronically Signed   By: Kerby Moors M.D.   On: 12/12/2021 07:07   DG Chest Port 1 View  Result Date: 12/12/2021 CLINICAL DATA:  56 year old female with possible sepsis. EXAM: PORTABLE CHEST 1 VIEW COMPARISON:  Portable chest 12/04/2020 and earlier. FINDINGS: Portable AP semi upright view at 0418 hours. Right  PICC line removed and new dual lumen right chest dialysis type catheter. No adverse features. Improved lung volumes and bibasilar ventilation. No pneumothorax. Allowing for portable technique the lungs are clear. Cervical or device projects over the left heart border today, presumed external artifact. Cardiac and mediastinal contours are within normal limits. Visualized tracheal air column is within normal limits. No acute osseous abnormality identified. Paucity of bowel gas. IMPRESSION: No acute cardiopulmonary abnormality. Right chest dialysis type catheter placed with no adverse features. Electronically Signed   By: Genevie Ann M.D.   On: 12/12/2021 05:41      Assessment/Plan Principal Problem:   Diarrhea Active Problems:   Stercoral colitis   Sacral wound   UTI (urinary tract infection)   End-stage renal disease on hemodialysis (HCC)   Essential hypertension   Hypothyroidism   Dyslipidemia   Myocardial injury   Type II diabetes mellitus with renal manifestations (HCC)   Anemia in ESRD (end-stage renal disease) (HCC)   Chronic pancreatitis (HCC)   Asthma   Closed fracture of sternum   Assessment and Plan: * Diarrhea Diarrhea due to stercoral colitis: Patient has fever of 100 and RR 24, but no tachycardia or leukocytosis.  Does not meet criteria for sepsis.  Since patient also has UTI and sacral wound with possible infection (cannot completely rule out possibility of osteomyelitis), patient was started on broad antibiotics.  Procalcitonin level 0.82, lactic acid 1.4.  -Admit to telemetry bed as inpatient -Antibiotics: Vancomycin, cefepime and Flagyl (patient received 1 dose of aztreonam in ED) -Follow-up blood culture and urine culture -IV fluid: 500 cc normal saline in ED -Follow-up C. difficile and GI pathogen panel   Addendum: GI pathogen panel is negative.  C. difficile test positive for antigen, but negative for toxin and PCR.  Patient does not meet criteria  l for sepsis.   Clinically does not seem to have CAD.  Will not start treatment for C. difficile.  Stercoral colitis - See above  Sacral wound Patient has stage IV sacral wound with infection, CT scan cannot completely rule out osteomyelitis. -Patient is on vancomycin, cefepime and Flagyl - check ESR and CRP -Wound care consult -Follow-up of blood culture  UTI (urinary tract infection) Patient was discharged on Cipro yesterday, still has typical symptoms of a UTI - Broad antibiotics -Repeat urinalysis -Follow-up urine culture  End-stage renal disease on hemodialysis (HCC) - Consulted Dr. Candiss Norse for renal for dialysis  Essential hypertension - IV hydralazine as needed -[place]  Hypothyroidism - Synthroid  Dyslipidemia - Lipitor  Myocardial injury Troponin 257 (242 on 12/10/2021).  Patient has front chest wall tenderness on palpation, which is most likely due to sternal fracture. -Trend troponin -Follow-up FLP -Aspirin, Lipitor -A1c was 4.9 on 12/09/2021, will not repeat  Type II diabetes mellitus with renal manifestations (HCC) - Recent A1c 4.9, well controlled.  Patient taking Humalog at home -Sliding scale insulin  Anemia in ESRD (end-stage renal disease) (HCC) Hemoglobin stable 9.1 (7.9 on 12/11/2021) -Follow-up by CBC  Chronic pancreatitis ([place]) CT scan showed chronic pancreatitis.  Liver function normal. Lipase 96 -Start Creon  Asthma Stable -Bronchodilators  Closed fracture of sternum - Pain control: As needed Percocet and Tylenol -Incentive spirometry          DVT ppx: SQ Heparin  Code Status: Full code  Family Communication:  Yes, patient's son   at bed side.  Disposition Plan:  Anticipate discharge back to previous environment, home  Consults called:  Dr. Candiss Norse of renal  Admission status and Level of care: Telemetry Medical:    as inpt      Severity of Illness:  The appropriate patient status for this patient is INPATIENT. Inpatient status is judged  to be reasonable and necessary in order to provide the required intensity of service to ensure the patient's safety. The patient's presenting symptoms, physical exam findings, and initial radiographic and laboratory data in the context of their chronic comorbidities is felt to place them at high risk for further clinical deterioration. Furthermore, it is not anticipated that the patient will be medically stable for discharge from the hospital within 2 midnights of admission.   * I certify that at the point of admission it is my clinical judgment that the patient will require inpatient hospital care spanning beyond 2 midnights from the point of admission due to high intensity of service, high risk for further deterioration and high frequency of surveillance required.*   Dispo: The patient is from: Home              Anticipated d/c is to: Home              Anticipated d/c date is: 2 day              Patient currently is not medically stable to d/c.     Date of Service 12/12/2021    [facility]   If 7PM-7AM, please contact night-coverage [email or web address] 12/12/2021, 12:09 PM

## 2021-12-12 NOTE — Progress Notes (Signed)
PHARMACY -  BRIEF ANTIBIOTIC NOTE   Pharmacy has received consult(s) for Vancomycin, Aztreonam from an ED provider.  The patient's profile has been reviewed for ht/wt/allergies/indication/available labs.    One time order(s) placed for Vancomycin 1500 mg IV X 1 and Aztreonam 2 gm IV X 1.   Further antibiotics/pharmacy consults should be ordered by admitting physician if indicated.                       Thank you, Tae Vonada D 12/12/2021  4:15 AM

## 2021-12-12 NOTE — Assessment & Plan Note (Addendum)
Last hemoglobin 7.7

## 2021-12-12 NOTE — Assessment & Plan Note (Deleted)
Troponin 257 (242 on 12/10/2021).  Patient has front chest wall tenderness on palpation, which is most likely due to sternal fracture. -Trend troponin -Follow-up FLP -Aspirin, Lipitor -A1c was 4.9 on 12/09/2021, will not repeat

## 2021-12-12 NOTE — Assessment & Plan Note (Addendum)
Continue with core

## 2021-12-12 NOTE — Assessment & Plan Note (Addendum)
-   Recent A1c 4.9, well controlled.  Stop sliding scale insulin at this time

## 2021-12-12 NOTE — Assessment & Plan Note (Addendum)
Dialysis yesterday and continue M,W,F

## 2021-12-13 DIAGNOSIS — R531 Weakness: Secondary | ICD-10-CM | POA: Diagnosis not present

## 2021-12-13 DIAGNOSIS — N3000 Acute cystitis without hematuria: Secondary | ICD-10-CM

## 2021-12-13 DIAGNOSIS — I5A Non-ischemic myocardial injury (non-traumatic): Secondary | ICD-10-CM

## 2021-12-13 DIAGNOSIS — K861 Other chronic pancreatitis: Secondary | ICD-10-CM | POA: Diagnosis not present

## 2021-12-13 DIAGNOSIS — N186 End stage renal disease: Secondary | ICD-10-CM | POA: Diagnosis not present

## 2021-12-13 DIAGNOSIS — R197 Diarrhea, unspecified: Secondary | ICD-10-CM | POA: Diagnosis not present

## 2021-12-13 DIAGNOSIS — E1122 Type 2 diabetes mellitus with diabetic chronic kidney disease: Secondary | ICD-10-CM

## 2021-12-13 DIAGNOSIS — Z992 Dependence on renal dialysis: Secondary | ICD-10-CM

## 2021-12-13 DIAGNOSIS — J4520 Mild intermittent asthma, uncomplicated: Secondary | ICD-10-CM | POA: Diagnosis not present

## 2021-12-13 DIAGNOSIS — R55 Syncope and collapse: Secondary | ICD-10-CM | POA: Diagnosis not present

## 2021-12-13 LAB — BASIC METABOLIC PANEL
Anion gap: 10 (ref 5–15)
BUN: 32 mg/dL — ABNORMAL HIGH (ref 6–20)
CO2: 25 mmol/L (ref 22–32)
Calcium: 9 mg/dL (ref 8.9–10.3)
Chloride: 106 mmol/L (ref 98–111)
Creatinine, Ser: 4.77 mg/dL — ABNORMAL HIGH (ref 0.44–1.00)
GFR, Estimated: 10 mL/min — ABNORMAL LOW (ref >60)
Glucose, Bld: 78 mg/dL (ref 70–99)
Potassium: 3.9 mmol/L (ref 3.5–5.1)
Sodium: 141 mmol/L (ref 135–145)

## 2021-12-13 LAB — CBC
HCT: 25.8 % — ABNORMAL LOW (ref 36.0–46.0)
Hemoglobin: 8 g/dL — ABNORMAL LOW (ref 12.0–15.0)
MCH: 29.7 pg (ref 26.0–34.0)
MCHC: 31 g/dL (ref 30.0–36.0)
MCV: 95.9 fL (ref 80.0–100.0)
Platelets: 169 10*3/uL (ref 150–400)
RBC: 2.69 MIL/uL — ABNORMAL LOW (ref 3.87–5.11)
RDW: 18.4 % — ABNORMAL HIGH (ref 11.5–15.5)
WBC: 3.2 10*3/uL — ABNORMAL LOW (ref 4.0–10.5)
nRBC: 0 % (ref 0.0–0.2)

## 2021-12-13 LAB — LIPASE, BLOOD: Lipase: 77 U/L — ABNORMAL HIGH (ref 11–51)

## 2021-12-13 LAB — GLUCOSE, CAPILLARY
Glucose-Capillary: 79 mg/dL (ref 70–99)
Glucose-Capillary: 140 mg/dL — ABNORMAL HIGH (ref 70–99)
Glucose-Capillary: 130 mg/dL — ABNORMAL HIGH (ref 70–99)
Glucose-Capillary: 100 mg/dL — ABNORMAL HIGH (ref 70–99)

## 2021-12-13 LAB — C-REACTIVE PROTEIN: CRP: 0.6 mg/dL (ref <1.0)

## 2021-12-13 LAB — HEPATITIS B SURFACE ANTIGEN: Hepatitis B Surface Ag: NONREACTIVE

## 2021-12-13 MED ORDER — MEDIHONEY WOUND/BURN DRESSING EX PSTE
1.0000 | PASTE | Freq: Every day | CUTANEOUS | Status: DC
  Administered 2021-12-13 – 2021-12-24 (×12): 1 via TOPICAL
  Filled 2021-12-13: qty 44
  Filled 2021-12-13: qty 1

## 2021-12-13 NOTE — Consult Note (Addendum)
Subjective:   CC: sacral wound  HPI:  Tonya Myers is a 56 y.o. female who was consulted by [facility] for evaluation of above.  First noted several months ago.  Receiving wound care at [facility], and has become smaller than initial presentation, but still has soreness over the area.  Denies any discharge or worsening symptoms   Past Medical History:  has a past medical history of Acute hemorrhoid (03/11/2015), Anxiety, Arthritis, Asthma, Cancer ([place]), Chronic back pain, CLL (chronic lymphocytic leukemia) ([place]), Depression, Diabetes mellitus without complication ([place]), Genital herpes, Hypertension, Hypothyroidism, and Vertigo.  Past Surgical History:  has a past surgical history that includes Colonoscopy with propofol (N/A, 06/16/2015); Breast biopsy (Right, 05/17/2016); Laparoscopic supracervical hysterectomy (2009); Bilateral salpingoophorectomy (2009); Abdominal hysterectomy; Oophorectomy; Thoracic laminectomy for epidural abscess (Bilateral, 06/17/2020); TEMPORARY DIALYSIS CATHETER (N/A, 12/10/2020); and DIALYSIS/PERMA CATHETER INSERTION (N/A, 12/17/2020).  Family History: family history includes Breast cancer (age of onset: 9) in her maternal aunt; Cancer in her paternal aunt.  Social History:  reports that she has never smoked. She has never used smokeless tobacco. She reports current alcohol use of about 1.0 standard drink of alcohol per week. She reports that she does not use drugs.  Current Medications:  Prior to Admission medications   Medication Sig Start Date End Date Taking? Authorizing Provider  losartan (COZAAR) 50 MG tablet Take 50 mg by mouth daily. 12/08/21 12/08/22 Yes [provider]  acetaminophen (TYLENOL) 325 MG tablet Take 2 tablets (650 mg total) by mouth every 6 (six) hours as needed for mild pain (or Fever >/= 101). 07/30/20   Angiulli, Lavon Paganini, PA-C  albuterol (VENTOLIN HFA) 108 (90 Base) MCG/ACT inhaler Inhale 2 puffs into the lungs every 6 (six) hours as needed for  wheezing or shortness of breath. 10/18/21   [provider]  amLODipine (NORVASC) 5 MG tablet Take 1 tablet (5 mg total) by mouth daily. Patient not taking: Reported on 12/09/2021 12/22/20   Little Ishikawa, MD  aspirin EC 81 MG tablet Take 1 tablet (81 mg total) by mouth daily. Swallow whole. Patient not taking: Reported on 12/09/2021 10/02/20   Max Sane, MD  atorvastatin (LIPITOR) 20 MG tablet Take 20 mg by mouth daily. 10/18/21   [provider]  ciprofloxacin (CIPRO) 250 MG tablet Take 1 tablet (250 mg total) by mouth daily with breakfast for 5 doses. Patient not taking: Reported on 12/12/2021 12/12/21 12/17/21  Nolberto Hanlon, MD  Continuous Blood Gluc Sensor (DEXCOM G6 SENSOR) MISC SMARTSIG:1 Each Topical Every 10 Days 08/12/20   [provider]  diclofenac Sodium (VOLTAREN) 1 % GEL Apply 2 g topically 4 (four) times daily. 07/30/20   Angiulli, Lavon Paganini, PA-C  fluticasone (FLONASE) 50 MCG/ACT nasal spray 1 spray 2 (two) times daily.    [provider]  insulin lispro (HUMALOG) 100 UNIT/ML KwikPen CBG < 70: Take glucose/juice/candy as directed CBG 70 - 120: 0 units CBG 121 - 150: 2 units CBG 151 - 200: 3 units CBG 201 - 250: 5 units CBG 251 - 300: 8 units CBG 301 - 350: 11 units CBG 351 - 400: 15 units CBG > 400: 15 units and recheck in 1 hour - if still >400 repeat 15 units and call PCP 12/22/20   Little Ishikawa, MD  levothyroxine (SYNTHROID) 125 MCG tablet Take 125 mcg by mouth daily. 12/08/21   [provider]  sevelamer carbonate (RENVELA) 800 MG tablet Take 800 mg by mouth 3 (three) times daily. 12/08/21  [provider]  budesonide-formoterol (SYMBICORT) 160-4.5 MCG/ACT inhaler Inhale into the lungs. 07/14/18 03/15/19  [provider]    Allergies:  Allergies  Allergen Reactions   Amoxapine    Penicillins    Ceftazidime     Other reaction(s): Confusion, Hallucination, Unknown Encephalopathy - improved after  dialysis/some concern for cephalosporin neurotoxicity Tolerated without confusion 06/2021. Encephalopathy - improved after dialysis/some concern for cephalosporin neurotoxicity Tolerated without confusion 06/2021.     ROS:  General: Denies weight loss, weight gain, fatigue, fevers, chills, and night sweats. Eyes: Denies blurry vision, double vision, eye pain, itchy eyes, and tearing. Ears: Denies hearing loss, earache, and ringing in ears. Nose: Denies sinus pain, congestion, infections, runny nose, and nosebleeds. Mouth/throat: Denies hoarseness, sore throat, bleeding gums, and difficulty swallowing. Heart: Denies chest pain, palpitations, racing heart, irregular heartbeat, leg pain or swelling, and decreased activity tolerance. Respiratory: Denies breathing difficulty, shortness of breath, wheezing, cough, and sputum. GI: Denies change in appetite, heartburn, nausea, vomiting, constipation, diarrhea, and blood in stool. GU: Denies difficulty urinating, pain with urinating, urgency, frequency, blood in urine. Musculoskeletal: Denies joint stiffness, pain, swelling, muscle weakness. Skin: Denies rash, itching, mass, tumors, sores, and boils Neurologic: Denies headache, fainting, dizziness, seizures, numbness, and tingling. Psychiatric: Denies depression, anxiety, difficulty sleeping, and memory loss. Endocrine: Denies heat or cold intolerance, and increased thirst or urination. Blood/lymph: Denies easy bruising, easy bruising, and swollen glands     Objective:     BP (!) 149/92 (BP Location: Left Arm)   Pulse 64   Temp 98.2 F (36.8 C)   Resp 16   SpO2 100%   Constitutional :  alert, cooperative, and appears stated age  Lymphatics/Throat:  no asymmetry, masses, or scars  Respiratory:  clear to auscultation bilaterally  Cardiovascular:  regular rate and rhythm  Gastrointestinal: soft, non-tender; bowel sounds normal; no masses,  no organomegaly.  Musculoskeletal: BLE extremity  weakness, able to flex and extend but non-weight bearing  Skin: Cool and moist, sacral wound measuring approx 5.5 cm x 5cm, filled with healthy, pink grnulation tissue with no evidence of underlying infection.  Surrounding skin with no induration or erythema, mild TTP.  Psychiatric: Normal affect, non-agitated, not confused       LABS:     Latest Ref Rng & Units 12/13/2021    6:30 AM 12/12/2021    5:46 AM 12/11/2021    6:14 AM  CMP  Glucose 70 - 99 mg/dL 78  82  80   BUN 6 - 20 mg/dL 32  20  39   Creatinine 0.44 - 1.00 mg/dL 4.77  3.31  5.69   Sodium 135 - 145 mmol/L 141  139  139   Potassium 3.5 - 5.1 mmol/L 3.9  4.1  3.9   Chloride 98 - 111 mmol/L 106  102  106   CO2 22 - 32 mmol/L '25  26  24   '$ Calcium 8.9 - 10.3 mg/dL 9.0  9.2  9.0   Total Protein 6.5 - 8.1 g/dL  6.1    Total Bilirubin 0.3 - 1.2 mg/dL  0.7    Alkaline Phos 38 - 126 U/L  69    AST 15 - 41 U/L  34    ALT 0 - 44 U/L  25        Latest Ref Rng & Units 12/13/2021    6:30 AM 12/12/2021    5:46 AM 12/11/2021    6:14 AM  CBC  WBC 4.0 - 10.5 K/uL  3.2  4.2  3.5   Hemoglobin 12.0 - 15.0 g/dL 8.0  9.1  7.9   Hematocrit 36.0 - 46.0 % 25.8  29.0  24.8   Platelets 150 - 400 K/uL 169  206  182     RADS: CLINICAL DATA:  Sepsis.   EXAM: CT CHEST, ABDOMEN AND PELVIS WITHOUT CONTRAST   TECHNIQUE: Multidetector CT imaging of the chest, abdomen and pelvis was performed following the standard protocol without IV contrast.   RADIATION DOSE REDUCTION: This exam was performed according to the departmental dose-optimization program which includes automated exposure control, adjustment of the mA and/or kV according to patient size and/or use of iterative reconstruction technique.   COMPARISON:  11/21/2020   FINDINGS: CT CHEST FINDINGS   Cardiovascular: The heart size appears upper limits of normal. Small pericardial effusion. Aortic and coronary artery atherosclerotic calcifications.   Mediastinum/Nodes: Thyroid gland,  trachea and esophagus demonstrate no significant findings. No enlarged axillary or mediastinal lymph nodes.   Lungs/Pleura: Small right pleural effusion. Atelectasis versus scar identified within the left lung base. No signs of interstitial edema or airspace consolidation. Nodule within the periphery of the right upper lobe measures 6 mm, image 62/4. On the previous exam this measured 7 mm. Tiny nodule in the periphery of the right upper lobe is stable measuring 3 mm, image 52/4.   Musculoskeletal: There is a new fracture deformity involving the proximal body of sternum, image 91/6 which may be related to recent cardiopulmonary resuscitation. No displaced rib fractures identified. Postsurgical changes from laminectomies identified within the thoracic spine at T3-4, T5-7 and T9-10 levels.   CT ABDOMEN PELVIS FINDINGS   Hepatobiliary: Intermediate attenuating structure within the posterior right lobe of liver measures 2 cm. This is indeterminate but based on previous imaging favored to represent a benign hemangioma. Adjacent low-density structure, not seen previously measures 7 mm and is technically too small to reliably characterize. Gallbladder appears normal. Tiny stone within the gallbladder is identified. No gallbladder wall thickening or inflammation.   Pancreas: There are scattered calcifications within the pancreatic parenchyma consistent with chronic pancreatitis. No main duct dilatation or inflammation identified.   Spleen: Normal in size without focal abnormality.   Adrenals/Urinary Tract: Normal adrenal glands. No nephrolithiasis or hydronephrosis identified. No hydroureter or ureteral lithiasis. The bladder is partially decompressed with mild diffuse wall thickening.   Stomach/Bowel: Stomach appears normal. The appendix is visualized and appears normal. No small bowel wall thickening, inflammation or distension. There is a large volume of retained desiccated  stool identified within the rectum. Perirectal fat stranding is identified. Findings may reflect underlying stercoral colitis.   Vascular/Lymphatic: Aortic atherosclerosis. No aneurysm. No signs of abdominopelvic adenopathy.   Reproductive: Status post hysterectomy. No adnexal masses.   Other: No free fluid or fluid collections.   Musculoskeletal: There has been interval development of abnormal presacral increased soft tissue which measures 2 cm in thickness, new from the previous exam. No fluid collection identified within this area.   New decubitus ulceration is identified overlying the coccyx with large area of subcutaneous increase soft tissue infiltration, image 116/2, image 94/5 and image 77/4. There is a small punctate focus of gas within this area, image 116/2. The increased soft tissue infiltration extends up to the coccyx which appears more sclerotic compared with the previous exam, image 114/2 and image 86/5. Cannot rule out underlying chronic osteomyelitis. No focal bone erosion or fragmentation noted. Degenerative disc disease is identified at L5-S1.,   IMPRESSION: 1. New decubitus  ulceration overlying the coccyx with large area of subcutaneous increase soft tissue infiltration. Small focus of gas identified in this area. The increased soft tissue infiltration extends up to the coccyx which appears more sclerotic compared with the previous exam. Cannot rule out underlying chronic osteomyelitis. 2. Interval development of abnormal presacral soft tissue thickening which is concerning for underlying or infection. 3. Large volume of retained desiccated stool identified within the rectum with mild surrounding fat stranding. Correlate for any clinical signs or symptoms of rectal impaction and possible stercoral colitis. 4. New fracture deformity involves the proximal body of sternum. 5. Small right pleural effusion. 6. Chronic pancreatitis. 7. Nodule within the  periphery of the right upper lobe measures 6 mm. This is stable compared with study from 11/21/2020. Future CT at 18-24 months (from 10/22/2020) is considered optional for low-risk patients, but is recommended for high-risk patients. This recommendation follows the consensus statement: Guidelines for Management of Incidental Pulmonary Nodules Detected on CT Images:From the Fleischner Society 2017; published online before print (10.1148/radiol.3235573220). 8. Aortic Atherosclerosis (ICD10-I70.0).     Electronically Signed   By: Kerby Moors M.D.   On: 12/12/2021 07:07    Assessment:     Sacral wound, stage III- chronic, improving with verbal report.   Plan:     1. CT findings likely describing the granulation tissue that is filling in area.  No obvious tracks or tunnels.  No sign of active infection. Not worth pursuing bone biopsy since no visible wound and disrupting granulation tissue will cause more harm then benefit at this time.  Local wound care per wound care RN.  Can start with medihoney to wound and keep it covered with mepilex in the meantime.  Also recommend pressure bed.  Surgery to sign off.  Call with new concerns  labs/images/medications/previous chart entries reviewed personally and relevant changes/updates noted above.

## 2021-12-13 NOTE — Plan of Care (Signed)
  Problem: Education: Goal: Ability to describe self-care measures that may prevent or decrease complications (Diabetes Survival Skills Education) will improve Outcome: Progressing   Problem: Coping: Goal: Ability to adjust to condition or change in health will improve Outcome: Progressing   Problem: Fluid Volume: Goal: Ability to maintain a balanced intake and output will improve Outcome: Progressing   Problem: Health Behavior/Discharge Planning: Goal: Ability to identify and utilize available resources and services will improve Outcome: Progressing Goal: Ability to manage health-related needs will improve Outcome: Progressing   Problem: Metabolic: Goal: Ability to maintain appropriate glucose levels will improve Outcome: Progressing   Problem: Nutritional: Goal: Maintenance of adequate nutrition will improve Outcome: Progressing Goal: Progress toward achieving an optimal weight will improve Outcome: Progressing   Problem: Skin Integrity: Goal: Risk for impaired skin integrity will decrease Outcome: Progressing   Problem: Tissue Perfusion: Goal: Adequacy of tissue perfusion will improve Outcome: Progressing   Problem: Education: Goal: Knowledge of General Education information will improve Description: Including pain rating scale, medication(s)/side effects and non-pharmacologic comfort measures Outcome: Progressing   Problem: Health Behavior/Discharge Planning: Goal: Ability to manage health-related needs will improve Outcome: Progressing   Problem: Clinical Measurements: Goal: Ability to maintain clinical measurements within normal limits will improve Outcome: Progressing Goal: Will remain free from infection Outcome: Progressing Goal: Diagnostic test results will improve Outcome: Progressing Goal: Respiratory complications will improve Outcome: Progressing Goal: Cardiovascular complication will be avoided Outcome: Progressing   Problem: Activity: Goal:  Risk for activity intolerance will decrease Outcome: Progressing   Problem: Nutrition: Goal: Adequate nutrition will be maintained Outcome: Progressing   Problem: Coping: Goal: Level of anxiety will decrease Outcome: Progressing   Problem: Elimination: Goal: Will not experience complications related to bowel motility Outcome: Progressing Goal: Will not experience complications related to urinary retention Outcome: Progressing   Problem: Pain Managment: Goal: General experience of comfort will improve Outcome: Progressing   Problem: Safety: Goal: Ability to remain free from injury will improve Outcome: Progressing   Problem: Skin Integrity: Goal: Risk for impaired skin integrity will decrease Outcome: Progressing   

## 2021-12-13 NOTE — Progress Notes (Signed)
Soap suds enema given this AM, was able to produce medium/large amount of brown solid stool.  Pt states that she has to used laxatives at home normally and sometimes needs to be disimpacted.

## 2021-12-13 NOTE — Progress Notes (Signed)
[facility]  ROUNDING NOTE   Subjective:   Tonya Myers is a 56 year old female with past medical history including depression, type 2 diabetes, asthma, hypertension, hypothyroidism, CLL, and end-stage renal disease on hemodialysis.   Patient seen resting in bed, alert Tolerating meals without nausea and vomiting Remains on room air, denies shortness of breath Denies pain or discomfort Requesting discharge plan   Objective:  Vital signs in last 24 hours:  Temp:  [98.1 F (36.7 C)-98.2 F (36.8 C)] 98.2 F (36.8 C) (08/20 0918) Pulse Rate:  [57-69] 64 (08/20 0918) Resp:  [16-19] 16 (08/20 0918) BP: (137-158)/(75-95) 149/92 (08/20 0918) SpO2:  [98 %-100 %] 100 % (08/20 0918)  Weight change:  There were no vitals filed for this visit.   Intake/Output: I/O last 3 completed shifts: In: 1100 [IV Piggyback:1100] Out: -    Intake/Output this shift:  No intake/output data recorded.  Physical Exam: General: NAD  Head: Normocephalic, atraumatic. Moist oral mucosal membranes  Eyes: Anicteric  Lungs:  Clear to auscultation, normal effort  Heart: Regular rate and rhythm  Abdomen:  Soft, nontender  Extremities:  trace peripheral edema.  Neurologic: Nonfocal, moving all four extremities  Skin: No lesions  Access: Rt Permcath    Basic Metabolic Panel: Recent Labs  Lab 12/09/21 1548 12/09/21 2329 12/10/21 0527 12/10/21 1044 12/11/21 0614 12/12/21 0546 12/13/21 0630  NA 137  --  138  --  139 139 141  K 3.3*  --  3.4*  --  3.9 4.1 3.9  CL 101  --  104  --  106 102 106  CO2 25  --  27  --  '24 26 25  '$ GLUCOSE 121*  --  83  --  80 82 78  BUN 16  --  25*  --  39* 20 32*  CREATININE 2.94*  --  3.75*  --  5.69* 3.31* 4.77*  CALCIUM 8.0*  --  8.3*  --  9.0 9.2 9.0  MG  --  1.9  --  2.1  --   --   --      Liver Function Tests: Recent Labs  Lab 12/12/21 0546  AST 34  ALT 25  ALKPHOS 69  BILITOT 0.7  PROT 6.1*  ALBUMIN 3.3*    Recent Labs  Lab  12/12/21 0546 12/13/21 0630  LIPASE 96* 77*    No results for input(s): "AMMONIA" in the last 168 hours.  CBC: Recent Labs  Lab 12/09/21 1548 12/10/21 0527 12/11/21 0614 12/12/21 0546 12/13/21 0630  WBC 4.4 6.3 3.5* 4.2 3.2*  NEUTROABS 1.3*  --   --  1.7  --   HGB 8.7* 8.0* 7.9* 9.1* 8.0*  HCT 28.0* 25.6* 24.8* 29.0* 25.8*  MCV 94.6 95.5 95.4 95.1 95.9  PLT 184 196 182 206 169     Cardiac Enzymes: No results for input(s): "CKTOTAL", "CKMB", "CKMBINDEX", "TROPONINI" in the last 168 hours.  BNP: Invalid input(s): "POCBNP"  CBG: Recent Labs  Lab 12/12/21 1322 12/12/21 1630 12/12/21 2139 12/13/21 0803 12/13/21 1119  GLUCAP 116* 88 79 79 140*     Microbiology: Results for orders placed or performed during the hospital encounter of 12/12/21  Resp Panel by RT-PCR (Flu A&B, Covid) Anterior Nasal Swab     Status: None   Collection Time: 12/12/21  4:28 AM   Specimen: Anterior Nasal Swab  Result Value Ref Range Status   SARS Coronavirus 2 by RT PCR NEGATIVE NEGATIVE Final    Comment: (NOTE) SARS-CoV-2  target nucleic acids are NOT DETECTED.  The SARS-CoV-2 RNA is generally detectable in upper respiratory specimens during the acute phase of infection. The lowest concentration of SARS-CoV-2 viral copies this assay can detect is 138 copies/mL. A negative result does not preclude SARS-Cov-2 infection and should not be used as the sole basis for treatment or other patient management decisions. A negative result may occur with  improper specimen collection/handling, submission of specimen other than nasopharyngeal swab, presence of viral mutation(s) within the areas targeted by this assay, and inadequate number of viral copies(<138 copies/mL). A negative result must be combined with clinical observations, patient history, and epidemiological information. The expected result is Negative.  Fact Sheet for Patients:  EntrepreneurPulse.com.au  Fact Sheet  for Healthcare Providers:  IncredibleEmployment.be  This test is no t yet approved or cleared by the [place] FDA and  has been authorized for detection and/or diagnosis of SARS-CoV-2 by FDA under an Emergency Use Authorization (EUA). This EUA will remain  in effect (meaning this test can be used) for the duration of the COVID-19 declaration under Section 564(b)(1) of the Act, 21 U.S.C.section 360bbb-3(b)(1), unless the authorization is terminated  or revoked sooner.       Influenza A by PCR NEGATIVE NEGATIVE Final   Influenza B by PCR NEGATIVE NEGATIVE Final    Comment: (NOTE) The Xpert Xpress SARS-CoV-2/FLU/RSV plus assay is intended as an aid in the diagnosis of influenza from Nasopharyngeal swab specimens and should not be used as a sole basis for treatment. Nasal washings and aspirates are unacceptable for Xpert Xpress SARS-CoV-2/FLU/RSV testing.  Fact Sheet for Patients: EntrepreneurPulse.com.au  Fact Sheet for Healthcare Providers: IncredibleEmployment.be  This test is not yet approved or cleared by the [place] FDA and has been authorized for detection and/or diagnosis of SARS-CoV-2 by FDA under an Emergency Use Authorization (EUA). This EUA will remain in effect (meaning this test can be used) for the duration of the COVID-19 declaration under Section 564(b)(1) of the Act, 21 U.S.C. section 360bbb-3(b)(1), unless the authorization is terminated or revoked.  Performed at [facility], [address] [place], [place] [address]   Gastrointestinal Panel by PCR , Stool     Status: None   Collection Time: 12/12/21  8:26 AM   Specimen: Stool  Result Value Ref Range Status   Campylobacter species NOT DETECTED NOT DETECTED Final   Plesimonas shigelloides NOT DETECTED NOT DETECTED Final   Salmonella species NOT DETECTED NOT DETECTED Final   Yersinia enterocolitica NOT DETECTED NOT DETECTED Final    Vibrio species NOT DETECTED NOT DETECTED Final   Vibrio cholerae NOT DETECTED NOT DETECTED Final   Enteroaggregative E coli (EAEC) NOT DETECTED NOT DETECTED Final   Enteropathogenic E coli (EPEC) NOT DETECTED NOT DETECTED Final   Enterotoxigenic E coli (ETEC) NOT DETECTED NOT DETECTED Final   Shiga like toxin producing E coli (STEC) NOT DETECTED NOT DETECTED Final   Shigella/Enteroinvasive E coli (EIEC) NOT DETECTED NOT DETECTED Final   Cryptosporidium NOT DETECTED NOT DETECTED Final   Cyclospora cayetanensis NOT DETECTED NOT DETECTED Final   Entamoeba histolytica NOT DETECTED NOT DETECTED Final   Giardia lamblia NOT DETECTED NOT DETECTED Final   Adenovirus F40/41 NOT DETECTED NOT DETECTED Final   Astrovirus NOT DETECTED NOT DETECTED Final   Norovirus GI/GII NOT DETECTED NOT DETECTED Final   Rotavirus A NOT DETECTED NOT DETECTED Final   Sapovirus (I, II, IV, and V) NOT DETECTED NOT DETECTED Final    Comment: Performed at  [facility], [place], [place] [address]  C Difficile Quick Screen w PCR reflex     Status: Abnormal   Collection Time: 12/12/21  8:26 AM   Specimen: STOOL  Result Value Ref Range Status   C Diff antigen POSITIVE (A) NEGATIVE Final   C Diff toxin NEGATIVE NEGATIVE Final   C Diff interpretation Results are indeterminate. See PCR results.  Final    Comment: Performed at [facility], [address] [place], [place] [address]  C. Diff by PCR, Reflexed     Status: None   Collection Time: 12/12/21  8:26 AM  Result Value Ref Range Status   Toxigenic C. Difficile by PCR NEGATIVE NEGATIVE Final    Comment: Patient is colonized with non toxigenic C. difficile. May not need treatment unless significant symptoms are present. Performed at [facility], [address] [place], [place] [address]   Blood Culture (routine x 2)     Status: None (Preliminary result)   Collection Time: 12/12/21 10:16 PM   Specimen: BLOOD LEFT FOREARM   Result Value Ref Range Status   Specimen Description BLOOD LEFT FOREARM  Final   Special Requests   Final    BOTTLES DRAWN AEROBIC AND ANAEROBIC Blood Culture adequate volume   Culture   Final    NO GROWTH < 12 HOURS Performed at [facility], [address] [place], [place] [address]    Report Status PENDING  Incomplete  Blood Culture (routine x 2)     Status: None (Preliminary result)   Collection Time: 12/12/21 10:27 PM   Specimen: BLOOD  Result Value Ref Range Status   Specimen Description BLOOD LEFT WRIST  Final   Special Requests   Final    BOTTLES DRAWN AEROBIC AND ANAEROBIC Blood Culture adequate volume   Culture   Final    NO GROWTH < 12 HOURS Performed at [facility], [address], [place], [place] [address]    Report Status PENDING  Incomplete    Coagulation Studies: Recent Labs    12/12/21 0546  LABPROT 13.2  INR 1.0     Urinalysis: Recent Labs    12/10/21 2100  COLORURINE YELLOW*  LABSPEC 1.008  PHURINE 9.0*  GLUCOSEU NEGATIVE  HGBUR NEGATIVE  BILIRUBINUR NEGATIVE  KETONESUR NEGATIVE  PROTEINUR 100*  NITRITE NEGATIVE  LEUKOCYTESUR LARGE*       Imaging: CT CHEST ABDOMEN PELVIS WO CONTRAST  Result Date: 12/12/2021 CLINICAL DATA:  Sepsis. EXAM: CT CHEST, ABDOMEN AND PELVIS WITHOUT CONTRAST TECHNIQUE: Multidetector CT imaging of the chest, abdomen and pelvis was performed following the standard protocol without IV contrast. RADIATION DOSE REDUCTION: This exam was performed according to the departmental dose-optimization program which includes automated exposure control, adjustment of the mA and/or kV according to patient size and/or use of iterative reconstruction technique. COMPARISON:  11/21/2020 FINDINGS: CT CHEST FINDINGS Cardiovascular: The heart size appears upper limits of normal. Small pericardial effusion. Aortic and coronary artery atherosclerotic calcifications. Mediastinum/Nodes: Thyroid gland, trachea and esophagus  demonstrate no significant findings. No enlarged axillary or mediastinal lymph nodes. Lungs/Pleura: Small right pleural effusion. Atelectasis versus scar identified within the left lung base. No signs of interstitial edema or airspace consolidation. Nodule within the periphery of the right upper lobe measures 6 mm, image 62/4. On the previous exam this measured 7 mm. Tiny nodule in the periphery of the right upper lobe is stable measuring 3 mm, image 52/4. Musculoskeletal: There is a new fracture deformity  involving the proximal body of sternum, image 91/6 which may be related to recent cardiopulmonary resuscitation. No displaced rib fractures identified. Postsurgical changes from laminectomies identified within the thoracic spine at T3-4, T5-7 and T9-10 levels. CT ABDOMEN PELVIS FINDINGS Hepatobiliary: Intermediate attenuating structure within the posterior right lobe of liver measures 2 cm. This is indeterminate but based on previous imaging favored to represent a benign hemangioma. Adjacent low-density structure, not seen previously measures 7 mm and is technically too small to reliably characterize. Gallbladder appears normal. Tiny stone within the gallbladder is identified. No gallbladder wall thickening or inflammation. Pancreas: There are scattered calcifications within the pancreatic parenchyma consistent with chronic pancreatitis. No main duct dilatation or inflammation identified. Spleen: Normal in size without focal abnormality. Adrenals/Urinary Tract: Normal adrenal glands. No nephrolithiasis or hydronephrosis identified. No hydroureter or ureteral lithiasis. The bladder is partially decompressed with mild diffuse wall thickening. Stomach/Bowel: Stomach appears normal. The appendix is visualized and appears normal. No small bowel wall thickening, inflammation or distension. There is a large volume of retained desiccated stool identified within the rectum. Perirectal fat stranding is identified. Findings  may reflect underlying stercoral colitis. Vascular/Lymphatic: Aortic atherosclerosis. No aneurysm. No signs of abdominopelvic adenopathy. Reproductive: Status post hysterectomy. No adnexal masses. Other: No free fluid or fluid collections. Musculoskeletal: There has been interval development of abnormal presacral increased soft tissue which measures 2 cm in thickness, new from the previous exam. No fluid collection identified within this area. New decubitus ulceration is identified overlying the coccyx with large area of subcutaneous increase soft tissue infiltration, image 116/2, image 94/5 and image 77/4. There is a small punctate focus of gas within this area, image 116/2. The increased soft tissue infiltration extends up to the coccyx which appears more sclerotic compared with the previous exam, image 114/2 and image 86/5. Cannot rule out underlying chronic osteomyelitis. No focal bone erosion or fragmentation noted. Degenerative disc disease is identified at L5-S1., IMPRESSION: 1. New decubitus ulceration overlying the coccyx with large area of subcutaneous increase soft tissue infiltration. Small focus of gas identified in this area. The increased soft tissue infiltration extends up to the coccyx which appears more sclerotic compared with the previous exam. Cannot rule out underlying chronic osteomyelitis. 2. Interval development of abnormal presacral soft tissue thickening which is concerning for underlying or infection. 3. Large volume of retained desiccated stool identified within the rectum with mild surrounding fat stranding. Correlate for any clinical signs or symptoms of rectal impaction and possible stercoral colitis. 4. New fracture deformity involves the proximal body of sternum. 5. Small right pleural effusion. 6. Chronic pancreatitis. 7. Nodule within the periphery of the right upper lobe measures 6 mm. This is stable compared with study from 11/21/2020. Future CT at 18-24 months (from 10/22/2020)  is considered optional for low-risk patients, but is recommended for high-risk patients. This recommendation follows the consensus statement: Guidelines for Management of Incidental Pulmonary Nodules Detected on CT Images:From the Fleischner Society 2017; published online before print (10.1148/radiol.[id number]). 8. Aortic Atherosclerosis (ICD10-I70.0). Electronically Signed   By: Kerby Moors M.D.   On: 12/12/2021 07:07   DG Chest Port 1 View  Result Date: 12/12/2021 CLINICAL DATA:  56 year old female with possible sepsis. EXAM: PORTABLE CHEST 1 VIEW COMPARISON:  Portable chest 12/04/2020 and earlier. FINDINGS: Portable AP semi upright view at 0418 hours. Right PICC line removed and new dual lumen right chest dialysis type catheter. No adverse features. Improved lung volumes and bibasilar ventilation. No pneumothorax. Allowing for portable technique the  lungs are clear. Cervical or device projects over the left heart border today, presumed external artifact. Cardiac and mediastinal contours are within normal limits. Visualized tracheal air column is within normal limits. No acute osseous abnormality identified. Paucity of bowel gas. IMPRESSION: No acute cardiopulmonary abnormality. Right chest dialysis type catheter placed with no adverse features. Electronically Signed   By: Genevie Ann M.D.   On: 12/12/2021 05:41     Medications:    ceFEPime (MAXIPIME) IV 1 g (12/13/21 0947)   metronidazole 500 mg (12/13/21 5009)     aspirin EC  81 mg Oral Daily   atorvastatin  20 mg Oral Daily   Chlorhexidine Gluconate Cloth  6 each Topical Daily   heparin  5,000 Units Subcutaneous Q8H   insulin aspart  0-5 Units Subcutaneous QHS   insulin aspart  0-9 Units Subcutaneous TID WC   levothyroxine  125 mcg Oral Q0600   lipase/protease/amylase  12,000 Units Oral TID WC   losartan  50 mg Oral Daily   sevelamer carbonate  800 mg Oral TID WC   acetaminophen, albuterol, dextromethorphan-guaiFENesin, diclofenac  Sodium, fluticasone, hydrALAZINE, iohexol, ondansetron (ZOFRAN) IV, oxyCODONE-acetaminophen  Assessment/ Plan:  Tonya Myers is a 56 y.o.  female with past medical history including depression, type 2 diabetes, asthma, hypertension, hypothyroidism, CLL, and end-stage renal disease on hemodialysis.  [facility]/MWF/right PermCath  end-stage renal disease on hemodialysis.    -Next treatment scheduled for Monday  2. Anemia of chronic kidney disease Lab Results  Component Value Date   HGB 8.0 (L) 12/13/2021  Hemoglobin below desired target.  Patient receives [place] outpatient.  3. Secondary Hyperparathyroidism: with outpatient labs: PTH 235, phosphorus 6.7, calcium 9.0 on 11/30/2021.   Lab Results  Component Value Date   CALCIUM 9.0 12/13/2021   PHOS 3.2 11/28/2020    Calcium and phosphorus within acceptable range.  4. 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 Home regimen includes lispro. Most recent hemoglobin A1c is 4.9 on 12/09/2021.   Glucose stable.  5.  Syncopal episode during dialysis treatment.  Cardiology consulted due to elevated troponins overnight.  Echo shows EF 55 to 60%.  Cardiac monitor remains in place from previous admission.  Patient reports no chest pain or discomfort  6.  Nausea/sacral wound/retained stool/C. difficile positive. Management as per internal medicine team.  Currently has received aztreonam, cefepime, metronidazole and vancomycin IV -If enema is considered, avoid fleets enema due to high phosphorus content.     LOS: 1   8/20/20231:32 PM

## 2021-12-13 NOTE — Progress Notes (Signed)
[place] completed spiritual consult visit with patient and spouse. Patient was alert and oriented throughout visit. Desired emotional and spiritual support. Spiritual care was provided through reflective/active listening, validation of feelings, normalizing emotions and being a non judgmental presence. Spiritual care visit was appreciated and welcomed again.   Rev. Marilynn Rail, M. Div.  Healthcare Chaplain

## 2021-12-13 NOTE — Progress Notes (Signed)
PROGRESS NOTE  Tonya Myers QHU:[id number] DOB: 08/25/1965 DOA: 12/12/2021 PCP: [name], MD  Brief History   Tonya Myers is a 56 y.o. 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    Pt was recently hospitalized from 8/16 - 8/18 due to syncope.  Per report, dialysis staff performed 2 minutes of CPR prior to arrival to Ed. Pt had elevated trop up to  452.  Cardiology was consulted, suspected that her syncopal episode was vasovagal/hypotensive in nature, and troponin elevation was demand ischemia in that setting. 2 D echo on 8/17 showed LVEF of 55-60% without WMA's or other valvular pathologies. Pt also has had UTI and was started on Cipro at the discharge.    Patient states that after she went home, she has worsening weakness.  She has fever and chills.  She continues to have symptoms of UTI, including dysuria, burning on urination and urinary frequency.  She also reports nausea, diarrhea and mild lower abdominal pain.  No vomiting.  Patient has had 5 times of diarrhea.  Patient states that after she had CPR, she has central chest wall tenderness.  No shortness breath or cough.  Patient had dialysis on last Friday.   Data reviewed independently and ED Course: pt was found to have troponin level 257 (452 on 12/10/2021), WBC 4.2, lactic acid 1.4, INR 1.0, PTT 32, negative COVID PCR, lipase 92, normal liver function, potassium 4.1, bicarbonate of 26, creatinine 3.31, BUN 20.  Temperature 100.  Blood pressure 146/91, heart rate 52, RR 24, oxygen saturation 100% on room air.  Chest x-ray negative for infiltration.    CT  chest demonstrated a new decubitus ulceration overlying the coccyx with large area of subcutaneous increase in soft tissue infiltration with small focus of gas. Question of underlying osteomyelitis. There is also presacral soft tissue thickening indicative of infection. There is also a large volume of retained desiccated stool in the rectum with mild surrounding fat stranding. There is also fracture of the sternum. There is a small right pleural effusion and chronic pancreatitis. There is a 36m nodule in the periphery of the right upper lobe.  Patient is admitted to telemetry bed as inpatient.  Consultants  General surgery  Procedures  None  Antibiotics   Anti-infectives (From admission, onward)    Start     Dose/Rate Route Frequency Ordered Stop   12/13/21 1000  ceFEPIme (MAXIPIME) 1 g in sodium chloride 0.9 % 100 mL IVPB        1 g 200 mL/hr over 30 Minutes Intravenous Every 24 hours 12/12/21 0905     12/12/21 1900  metroNIDAZOLE (FLAGYL) IVPB 500 mg        500 mg 100 mL/hr over 60 Minutes Intravenous Every 12 hours 12/12/21 0753     12/12/21 0415  vancomycin (VANCOREADY) IVPB 1500 mg/300 mL        1,500 mg 150 mL/hr over 120 Minutes Intravenous  Once 12/12/21 0414 12/12/21 1023   12/12/21 0400  aztreonam (AZACTAM) 2 g in sodium chloride 0.9 % 100 mL IVPB        2 g 200 mL/hr over 30 Minutes Intravenous  Once 12/12/21 0354 12/12/21 04656  12/12/21 0400  metroNIDAZOLE (FLAGYL) IVPB 500 mg        500 mg 100 mL/hr over 60 Minutes Intravenous  Once 12/12/21 0354 12/12/21 0835   12/12/21 0400  vancomycin (VANCOCIN) IVPB 1000 mg/200 mL premix  Status:  Discontinued        1,000 mg 200 mL/hr over 60 Minutes Intravenous  Once 12/12/21 0354 12/12/21 0414      Subjective  The patient is resting in bed. No new complaints.   Objective   Vitals:  Vitals:   12/13/21 0430 12/13/21 0918  BP: (!) 141/91 (!) 149/92  Pulse: 69 64  Resp: 17 16  Temp: 98.1 F (36.7 C) 98.2 F (36.8 C)   SpO2: 100% 100%   Exam:  Constitutional:  The patient is awake, alert, and oriented x 3. No acute distress. Respiratory:  No increased work of breathing. No wheezes, rales, or rhonchi No tactile fremitus Cardiovascular:  Regular rate and rhythm No murmurs, ectopy, or gallups. No lateral PMI. No thrills. Abdomen:  Abdomen is soft, non-tender, non-distended No hernias, masses, or organomegaly Normoactive bowel sounds.  Musculoskeletal:  No cyanosis, clubbing, or edema Skin:  No rashes, lesions, ulcers palpation of skin: no induration or nodules Neurologic:  CN 2-12 intact Sensation all 4 extremities intact Psychiatric:  Mental status Mood, affect appropriate Orientation to person, place, time  judgment and insight appear intact   I have personally reviewed the following:   Today's Data  Vitals  Lab Data  BMP CBC  Micro Data  Blood culture x 2  Imaging  CT chest abdomen and pelvis  Cardiology Data  EKG  Other Data    Scheduled Meds:  aspirin EC  81 mg Oral Daily   atorvastatin  20 mg Oral Daily   Chlorhexidine Gluconate Cloth  6 each Topical Daily   heparin  5,000 Units Subcutaneous Q8H   insulin aspart  0-5 Units Subcutaneous QHS   insulin aspart  0-9 Units Subcutaneous TID WC   leptospermum manuka honey  1 Application Topical Daily   levothyroxine  125 mcg Oral Q0600   lipase/protease/amylase  12,000 Units Oral TID WC   losartan  50 mg Oral Daily   sevelamer carbonate  800 mg Oral TID WC   Continuous Infusions:  ceFEPime (MAXIPIME) IV 1 g (12/13/21 0947)   metronidazole 500 mg (12/13/21 0651)    Principal Problem:   Diarrhea Active Problems:   Stercoral colitis   Sacral wound   UTI (urinary tract infection)   End-stage renal disease on hemodialysis ([place])   Essential hypertension   Hypothyroidism   Dyslipidemia   Myocardial injury   Type II diabetes mellitus with renal manifestations ([place])   Anemia in ESRD (end-stage renal disease)  ([place])   Chronic pancreatitis (HCC)   Asthma   Closed fracture of sternum   LOS: 1 day    A & P  Assessment and Plan: * Diarrhea Diarrhea due to fecal impaction resulting in stercoral colitis: Loose stools are the result of fecal impaction which will allow only loose stool by for excretion. CT has demonstrated a fecal impaction and stercoral impaction.  GI panel is negative. C Diff was positive for antigen, but negative for toxin and PCR. Will discontinue enteric precautions.  The patient is receiving cefepime and flagyl.   She is going to receive a soap suds enema.   Will monitor.     Stercoral colitis The patient is receiving cefepime and flagyl. Soap suds enema to resolve fecal impaction.  Sacral wound Patient has stage IV sacral wound with infection, CT scan cannot completely rule out osteomyelitis. -Patient is on vancomycin, cefepime and Flagyl -CRP is negative -ESR is elevated at 42 -General surgery has consulted and states that there is no need for I &D.  -Wound care consult pending. -Blood culture has had no growth  UTI (urinary tract infection) Patient was discharged on Cipro yesterday, still has typical symptoms of a UTI - Broad antibiotics -Repeat urinalysis -Follow-up urine culture  End-stage renal disease on hemodialysis (HCC) - Consulted Dr. Candiss Norse for renal for dialysis  Essential hypertension - IV hydralazine as needed -[place]  Hypothyroidism - Synthroid  Dyslipidemia - Lipitor  Myocardial injury Troponin 257 (242 on 12/10/2021).  Patient has front chest wall tenderness on palpation, which is most likely due to sternal fracture. -Trend troponin -Follow-up FLP -Aspirin, Lipitor -A1c was 4.9 on 12/09/2021, will not repeat  Type II diabetes mellitus with renal manifestations (HCC) - Recent A1c 4.9, well controlled.  Patient taking Humalog at home -Sliding scale insulin  Anemia in ESRD (end-stage renal disease) (HCC) Hemoglobin stable 9.1  (7.9 on 12/11/2021) -Follow-up by CBC  Chronic pancreatitis ([place]) CT scan showed chronic pancreatitis.  Liver function normal. Lipase 96 -Start Creon  Asthma Stable -Bronchodilators  Closed fracture of sternum - Pain control: As needed Percocet and Tylenol -Incentive spirometry   I have seen and examined this patient myself. I have spent 34 minutes in her evaluation and care.  DVT prophylaxis: Heparin Code Status: Full Code Family Communication: Spouse at bedside Disposition Plan: home    Raneem Mendolia, DO [facility] Direct contact: see [email or web address]  7PM-7AM contact night coverage as above 12/13/2021, 2:48 PM  LOS: 1 day

## 2021-12-14 DIAGNOSIS — K861 Other chronic pancreatitis: Secondary | ICD-10-CM | POA: Diagnosis not present

## 2021-12-14 DIAGNOSIS — R55 Syncope and collapse: Secondary | ICD-10-CM | POA: Diagnosis not present

## 2021-12-14 DIAGNOSIS — J4520 Mild intermittent asthma, uncomplicated: Secondary | ICD-10-CM | POA: Diagnosis not present

## 2021-12-14 DIAGNOSIS — N186 End stage renal disease: Secondary | ICD-10-CM | POA: Diagnosis not present

## 2021-12-14 DIAGNOSIS — Z8619 Personal history of other infectious and parasitic diseases: Secondary | ICD-10-CM | POA: Diagnosis present

## 2021-12-14 DIAGNOSIS — R197 Diarrhea, unspecified: Secondary | ICD-10-CM | POA: Diagnosis not present

## 2021-12-14 DIAGNOSIS — R531 Weakness: Secondary | ICD-10-CM | POA: Diagnosis not present

## 2021-12-14 LAB — BASIC METABOLIC PANEL
Anion gap: 12 (ref 5–15)
BUN: 47 mg/dL — ABNORMAL HIGH (ref 6–20)
CO2: 23 mmol/L (ref 22–32)
Calcium: 9.1 mg/dL (ref 8.9–10.3)
Chloride: 106 mmol/L (ref 98–111)
Creatinine, Ser: 5.96 mg/dL — ABNORMAL HIGH (ref 0.44–1.00)
GFR, Estimated: 8 mL/min — ABNORMAL LOW (ref >60)
Glucose, Bld: 100 mg/dL — ABNORMAL HIGH (ref 70–99)
Potassium: 4.2 mmol/L (ref 3.5–5.1)
Sodium: 141 mmol/L (ref 135–145)

## 2021-12-14 LAB — CBC WITH DIFFERENTIAL/PLATELET
Abs Immature Granulocytes: 0.01 10*3/uL (ref 0.00–0.07)
Basophils Absolute: 0 10*3/uL (ref 0.0–0.1)
Basophils Relative: 1 %
Eosinophils Absolute: 0.2 10*3/uL (ref 0.0–0.5)
Eosinophils Relative: 4 %
HCT: 25.2 % — ABNORMAL LOW (ref 36.0–46.0)
Hemoglobin: 8 g/dL — ABNORMAL LOW (ref 12.0–15.0)
Immature Granulocytes: 0 %
Lymphocytes Relative: 43 %
Lymphs Abs: 1.6 10*3/uL (ref 0.7–4.0)
MCH: 30.4 pg (ref 26.0–34.0)
MCHC: 31.7 g/dL (ref 30.0–36.0)
MCV: 95.8 fL (ref 80.0–100.0)
Monocytes Absolute: 0.3 10*3/uL (ref 0.1–1.0)
Monocytes Relative: 9 %
Neutro Abs: 1.6 10*3/uL — ABNORMAL LOW (ref 1.7–7.7)
Neutrophils Relative %: 43 %
Platelets: 172 10*3/uL (ref 150–400)
RBC: 2.63 MIL/uL — ABNORMAL LOW (ref 3.87–5.11)
RDW: 18.2 % — ABNORMAL HIGH (ref 11.5–15.5)
WBC: 3.7 10*3/uL — ABNORMAL LOW (ref 4.0–10.5)
nRBC: 0 % (ref 0.0–0.2)

## 2021-12-14 LAB — GLUCOSE, CAPILLARY
Glucose-Capillary: 80 mg/dL (ref 70–99)
Glucose-Capillary: 73 mg/dL (ref 70–99)
Glucose-Capillary: 151 mg/dL — ABNORMAL HIGH (ref 70–99)
Glucose-Capillary: 73 mg/dL (ref 70–99)

## 2021-12-14 MED ORDER — SENNOSIDES-DOCUSATE SODIUM 8.6-50 MG PO TABS
2.0000 | ORAL_TABLET | Freq: Every day | ORAL | Status: DC
Start: 2021-12-14 — End: 2021-12-24
  Administered 2021-12-17 – 2021-12-23 (×5): 2 via ORAL
  Filled 2021-12-14 (×7): qty 2

## 2021-12-14 MED ORDER — RENA-VITE PO TABS
1.0000 | ORAL_TABLET | Freq: Every day | ORAL | Status: DC
  Administered 2021-12-14 – 2021-12-23 (×8): 1 via ORAL
  Filled 2021-12-14 (×11): qty 1

## 2021-12-14 MED ORDER — NEPRO/CARBSTEADY PO LIQD
237.0000 mL | Freq: Three times a day (TID) | ORAL | Status: DC
  Administered 2021-12-14 – 2021-12-15 (×3): 237 mL via ORAL

## 2021-12-14 MED ORDER — ACETAMINOPHEN 325 MG PO TABS
ORAL_TABLET | ORAL | Status: AC
  Filled 2021-12-14: qty 2

## 2021-12-14 MED ORDER — POLYETHYLENE GLYCOL 3350 17 G PO PACK
17.0000 g | PACK | Freq: Two times a day (BID) | ORAL | Status: DC
Start: 2021-12-14 — End: 2021-12-24
  Administered 2021-12-15 – 2021-12-24 (×9): 17 g via ORAL
  Filled 2021-12-14 (×11): qty 1

## 2021-12-14 MED ORDER — VITAMIN C 500 MG PO TABS
500.0000 mg | ORAL_TABLET | Freq: Two times a day (BID) | ORAL | Status: DC
  Administered 2021-12-14 – 2021-12-24 (×19): 500 mg via ORAL
  Filled 2021-12-14 (×19): qty 1

## 2021-12-14 MED ORDER — HEPARIN SODIUM (PORCINE) 1000 UNIT/ML IJ SOLN
INTRAMUSCULAR | Status: AC
  Filled 2021-12-14: qty 10

## 2021-12-14 MED ORDER — VALACYCLOVIR HCL 500 MG PO TABS
500.0000 mg | ORAL_TABLET | Freq: Every day | ORAL | Status: DC
  Administered 2021-12-14 – 2021-12-24 (×10): 500 mg via ORAL
  Filled 2021-12-14 (×11): qty 1

## 2021-12-14 MED ORDER — ZINC SULFATE 220 (50 ZN) MG PO CAPS
220.0000 mg | ORAL_CAPSULE | Freq: Every day | ORAL | Status: DC
  Administered 2021-12-14 – 2021-12-24 (×10): 220 mg via ORAL
  Filled 2021-12-14 (×10): qty 1

## 2021-12-14 MED ORDER — VANCOMYCIN HCL 750 MG/150ML IV SOLN
750.0000 mg | INTRAVENOUS | Status: DC
  Administered 2021-12-14: 750 mg via INTRAVENOUS
  Filled 2021-12-14: qty 150

## 2021-12-14 MED ORDER — PROSOURCE PLUS PO LIQD
30.0000 mL | Freq: Three times a day (TID) | ORAL | Status: DC
  Administered 2021-12-14 – 2021-12-24 (×18): 30 mL via ORAL
  Filled 2021-12-14 (×33): qty 30

## 2021-12-14 NOTE — Assessment & Plan Note (Signed)
Continue suppressive valacyclovir as prior to admission.

## 2021-12-14 NOTE — Progress Notes (Signed)
PHARMACY NOTE:  ANTIMICROBIAL RENAL DOSAGE ADJUSTMENT  Current antimicrobial regimen includes a mismatch between antimicrobial dosage and estimated renal function.  As per policy approved by the Pharmacy & Therapeutics and Medical Executive Committees, the antimicrobial dosage will be adjusted accordingly.  Current antimicrobial dosage:  Valtrex 1 g daily  Indication: HSV suppressive therapy  Renal Function:  Estimated Creatinine Clearance: 10.2 mL/min (A) (by C-G formula based on SCr of 5.96 mg/dL (H)). '[x]'$      On intermittent HD, scheduled: '[]'$      On CRRT    Antimicrobial dosage has been changed to:  Valtrex 500 mg daily  Thank you for allowing pharmacy to be a part of this patient's care.  Benita Gutter, [facility] 12/14/2021 3:56 PM

## 2021-12-14 NOTE — Progress Notes (Signed)
Initial Nutrition Assessment  DOCUMENTATION CODES:   Not applicable  INTERVENTION:   -Liberalize diet to 2 gram sodium for wider variety of meal selections -Renal MVI daily -30 ml Prosource Plus TID, each supplement provides 100 kcals and 15 grams protein -Nepro Shake po TID, each supplement provides 425 kcal and 19 grams protein  -500 mg vitamin C BID -220 mg zinc sulfate daily x 14 days  NUTRITION DIAGNOSIS:   Increased nutrient needs related to wound healing as evidenced by estimated needs.  GOAL:   Patient will meet greater than or equal to 90% of their needs  MONITOR:   PO intake, Supplement acceptance  REASON FOR ASSESSMENT:   Consult Assessment of nutrition requirement/status, Wound healing  ASSESSMENT:   Pt with medical history significant of ESRD on HD (MWF), ANCA vasculitis, DM, HTN, HLD, anxiety, asthma, CLL, depression, type 2 diabetes mellitus, hypertension and hypothyroidism,  history of strep pneumo meningitis with epidural abscess and thoracolumbar discitis s/p 10-11 laminectomy and abscess evacuation in 2020, chronic weakness in both legs and right arm, chronic decubitus ulcer, who presents with weakness, fever.  Pt admitted with diarrhea secondary to stercoral colitis.   Reviewed I/O's: +300 ml x 24 hours and +1.4 L x 24 hours  Pt unavailable at time of visit. Pt down in HD suite at time of visit. RD unable to obtain further nutrition-related history or complete nutrition-focused physical exam at this time.    Pt currently on a heart healthy, carb modified diet. No meal completion data available to assess at this time.   Per general surgery notes, CT findings reveal granulation tissue filling the area. No plans for surgical interventions at this time. Pt is followed by wound care at [facility]. Per [facility] note on 12/11/21, pt with stage 3 healing pressure injury to sacrum.   Reviewed wt hx; pt has experienced a 32.5% wt loss over the past 3 months, which is  significant for time frame.   Pt is at high risk for malnutrition given ESRD on HD, significant weight loss, and increased nutritional needs for wound healing. RD unable to identify malnutrition at this time, however, pt would greatly benefit from addition of oral nutrition supplements.   Medications reviewed and include creon and renvela.   Lab Results  Component Value Date   HGBA1C 4.9 12/09/2021   PTA DM medications are SSI insulin lispro TID.   Labs reviewed: CBGS: 80-140 (inpatient orders for glycemic control are 0-5 units insulin aspart daily at bedtime and 0-9 units insulin aspart TID with meals).    Diet Order:   Diet Order             Diet heart healthy/carb modified Room service appropriate? Yes; Fluid consistency: Thin  Diet effective now                   EDUCATION NEEDS:   No education needs have been identified at this time  Skin:  Skin Assessment: Skin Integrity Issues: Skin Integrity Issues:: Stage III Stage II: - Stage III: sacrum (healing per CWOCN note on 12/11/21)  Last BM:  12/14/21 (type 3)  Height:   Ht Readings from Last 1 Encounters:  12/09/21 6' (1.829 m)    Weight:   Wt Readings from Last 1 Encounters:  12/14/21 63.7 kg    Ideal Body Weight:  78.2 kg  BMI:  Body mass index is 19.05 kg/m.  Estimated Nutritional Needs:   Kcal:  2200-2400  Protein:  125-150 grams  Fluid:  1000 ml + UOP    Loistine Chance, RD, LDN, [place] Registered Dietitian II Certified Diabetes Care and Education Specialist Please refer to [facility] for RD and/or RD on-call/weekend/after hours pager

## 2021-12-14 NOTE — Consult Note (Signed)
[place] Nurse Consult Note: Reason for Consult: sacral wound, seen for same this admission by my [place] on 12/11/21, see her consult note.  Surgery evauluated patient 12/12/21 and wrote new wound care orders in his consult note and added treatment into [facility] with [facility] I have updated orders accordingly.  POC appropriate  I have added RD consultation for wound healing supplementation Patient has been followed by [facility] for wound care.  See previous WOC consult note for description of wound   Re consult if needed, will not follow at this time. Thanks  Radek Carnero [employer], RN,CWOCN, CNS, [place] [phone])

## 2021-12-14 NOTE — Progress Notes (Signed)
[facility]  ROUNDING NOTE   Subjective:   Tonya Myers is a 56 year old female with past medical history including depression, type 2 diabetes, asthma, hypertension, hypothyroidism, CLL, and end-stage renal disease on hemodialysis.   Patient seen and evaluated during dialysis   HEMODIALYSIS FLOWSHEET:  Blood Flow Rate (mL/min): 400 mL/min Arterial Pressure (mmHg): -130 mmHg Venous Pressure (mmHg): 150 mmHg TMP (mmHg): 7 mmHg Ultrafiltration Rate (mL/min): 372 mL/min Dialysate Flow Rate (mL/min): 300 ml/min Dialysis Fluid Bolus: Normal Saline Bolus Amount (mL): 300 mL  No complaints at this time Denies pain   Objective:  Vital signs in last 24 hours:  Temp:  [98 F (36.7 C)-98.8 F (37.1 C)] 98.8 F (37.1 C) (08/21 0834) Pulse Rate:  [48-102] 48 (08/21 1252) Resp:  [12-18] 12 (08/21 1252) BP: (125-185)/(71-104) 159/93 (08/21 1230) SpO2:  [91 %-100 %] 99 % (08/21 1252) Weight:  [63.7 kg-73.7 kg] 63.7 kg (08/21 0904)  Weight change:  Filed Weights   12/14/21 0500 12/14/21 0904  Weight: 73.7 kg 63.7 kg     Intake/Output: I/O last 3 completed shifts: In: 400 [IV Piggyback:400] Out: -    Intake/Output this shift:  No intake/output data recorded.  Physical Exam: General: NAD  Head: Normocephalic, atraumatic. Moist oral mucosal membranes  Eyes: Anicteric  Lungs:  Clear to auscultation, normal effort  Heart: Regular rate and rhythm  Abdomen:  Soft, nontender  Extremities:  trace peripheral edema.  Neurologic: Nonfocal, moving all four extremities  Skin: No lesions  Access: Rt Permcath    Basic Metabolic Panel: Recent Labs  Lab 12/09/21 2329 12/10/21 0527 12/10/21 1044 12/11/21 0614 12/12/21 0546 12/13/21 0630 12/14/21 0637  NA  --  138  --  139 139 141 141  K  --  3.4*  --  3.9 4.1 3.9 4.2  CL  --  104  --  106 102 106 106  CO2  --  27  --  '24 26 25 23  '$ GLUCOSE  --  83  --  80 82 78 100*  BUN  --  25*  --  39* 20 32* 47*   CREATININE  --  3.75*  --  5.69* 3.31* 4.77* 5.96*  CALCIUM  --  8.3*  --  9.0 9.2 9.0 9.1  MG 1.9  --  2.1  --   --   --   --      Liver Function Tests: Recent Labs  Lab 12/12/21 0546  AST 34  ALT 25  ALKPHOS 69  BILITOT 0.7  PROT 6.1*  ALBUMIN 3.3*    Recent Labs  Lab 12/12/21 0546 12/13/21 0630  LIPASE 96* 77*    No results for input(s): "AMMONIA" in the last 168 hours.  CBC: Recent Labs  Lab 12/09/21 1548 12/10/21 0527 12/11/21 0614 12/12/21 0546 12/13/21 0630 12/14/21 0637  WBC 4.4 6.3 3.5* 4.2 3.2* 3.7*  NEUTROABS 1.3*  --   --  1.7  --  1.6*  HGB 8.7* 8.0* 7.9* 9.1* 8.0* 8.0*  HCT 28.0* 25.6* 24.8* 29.0* 25.8* 25.2*  MCV 94.6 95.5 95.4 95.1 95.9 95.8  PLT 184 196 182 206 169 172     Cardiac Enzymes: No results for input(s): "CKTOTAL", "CKMB", "CKMBINDEX", "TROPONINI" in the last 168 hours.  BNP: Invalid input(s): "POCBNP"  CBG: Recent Labs  Lab 12/13/21 0803 12/13/21 1119 12/13/21 1701 12/13/21 2323 12/14/21 0733  GLUCAP 79 140* 130* 100* 80     Microbiology: Results for orders placed or performed during the  hospital encounter of 12/12/21  Resp Panel by RT-PCR (Flu A&B, Covid) Anterior Nasal Swab     Status: None   Collection Time: 12/12/21  4:28 AM   Specimen: Anterior Nasal Swab  Result Value Ref Range Status   SARS Coronavirus 2 by RT PCR NEGATIVE NEGATIVE Final    Comment: (NOTE) SARS-CoV-2 target nucleic acids are NOT DETECTED.  The SARS-CoV-2 RNA is generally detectable in upper respiratory specimens during the acute phase of infection. The lowest concentration of SARS-CoV-2 viral copies this assay can detect is 138 copies/mL. A negative result does not preclude SARS-Cov-2 infection and should not be used as the sole basis for treatment or other patient management decisions. A negative result may occur with  improper specimen collection/handling, submission of specimen other than nasopharyngeal swab, presence of viral  mutation(s) within the areas targeted by this assay, and inadequate number of viral copies(<138 copies/mL). A negative result must be combined with clinical observations, patient history, and epidemiological information. The expected result is Negative.  Fact Sheet for Patients:  EntrepreneurPulse.com.au  Fact Sheet for Healthcare Providers:  IncredibleEmployment.be  This test is no t yet approved or cleared by the [place] FDA and  has been authorized for detection and/or diagnosis of SARS-CoV-2 by FDA under an Emergency Use Authorization (EUA). This EUA will remain  in effect (meaning this test can be used) for the duration of the COVID-19 declaration under Section 564(b)(1) of the Act, 21 U.S.C.section 360bbb-3(b)(1), unless the authorization is terminated  or revoked sooner.       Influenza A by PCR NEGATIVE NEGATIVE Final   Influenza B by PCR NEGATIVE NEGATIVE Final    Comment: (NOTE) The Xpert Xpress SARS-CoV-2/FLU/RSV plus assay is intended as an aid in the diagnosis of influenza from Nasopharyngeal swab specimens and should not be used as a sole basis for treatment. Nasal washings and aspirates are unacceptable for Xpert Xpress SARS-CoV-2/FLU/RSV testing.  Fact Sheet for Patients: EntrepreneurPulse.com.au  Fact Sheet for Healthcare Providers: IncredibleEmployment.be  This test is not yet approved or cleared by the [place] FDA and has been authorized for detection and/or diagnosis of SARS-CoV-2 by FDA under an Emergency Use Authorization (EUA). This EUA will remain in effect (meaning this test can be used) for the duration of the COVID-19 declaration under Section 564(b)(1) of the Act, 21 U.S.C. section 360bbb-3(b)(1), unless the authorization is terminated or revoked.  Performed at [facility], [address] [place], [place] [address]   Gastrointestinal Panel by PCR ,  Stool     Status: None   Collection Time: 12/12/21  8:26 AM   Specimen: Stool  Result Value Ref Range Status   Campylobacter species NOT DETECTED NOT DETECTED Final   Plesimonas shigelloides NOT DETECTED NOT DETECTED Final   Salmonella species NOT DETECTED NOT DETECTED Final   Yersinia enterocolitica NOT DETECTED NOT DETECTED Final   Vibrio species NOT DETECTED NOT DETECTED Final   Vibrio cholerae NOT DETECTED NOT DETECTED Final   Enteroaggregative E coli (EAEC) NOT DETECTED NOT DETECTED Final   Enteropathogenic E coli (EPEC) NOT DETECTED NOT DETECTED Final   Enterotoxigenic E coli (ETEC) NOT DETECTED NOT DETECTED Final   Shiga like toxin producing E coli (STEC) NOT DETECTED NOT DETECTED Final   Shigella/Enteroinvasive E coli (EIEC) NOT DETECTED NOT DETECTED Final   Cryptosporidium NOT DETECTED NOT DETECTED Final   Cyclospora cayetanensis NOT DETECTED NOT DETECTED Final   Entamoeba histolytica NOT DETECTED NOT DETECTED Final   Giardia lamblia NOT  DETECTED NOT DETECTED Final   Adenovirus F40/41 NOT DETECTED NOT DETECTED Final   Astrovirus NOT DETECTED NOT DETECTED Final   Norovirus GI/GII NOT DETECTED NOT DETECTED Final   Rotavirus A NOT DETECTED NOT DETECTED Final   Sapovirus (I, II, IV, and V) NOT DETECTED NOT DETECTED Final    Comment: Performed at [facility], [place], [place] [address]  C Difficile Quick Screen w PCR reflex     Status: Abnormal   Collection Time: 12/12/21  8:26 AM   Specimen: STOOL  Result Value Ref Range Status   C Diff antigen POSITIVE (A) NEGATIVE Final   C Diff toxin NEGATIVE NEGATIVE Final   C Diff interpretation Results are indeterminate. See PCR results.  Final    Comment: Performed at [facility], [address] [place], [place] [address]  C. Diff by PCR, Reflexed     Status: None   Collection Time: 12/12/21  8:26 AM  Result Value Ref Range Status   Toxigenic C. Difficile by PCR NEGATIVE NEGATIVE Final     Comment: Patient is colonized with non toxigenic C. difficile. May not need treatment unless significant symptoms are present. Performed at [facility], [address] [place], [place] [address]   Blood Culture (routine x 2)     Status: None (Preliminary result)   Collection Time: 12/12/21 10:16 PM   Specimen: BLOOD LEFT FOREARM  Result Value Ref Range Status   Specimen Description BLOOD LEFT FOREARM  Final   Special Requests   Final    BOTTLES DRAWN AEROBIC AND ANAEROBIC Blood Culture adequate volume   Culture   Final    NO GROWTH 2 DAYS Performed at [facility], [address] [place], [place] [address]    Report Status PENDING  Incomplete  Blood Culture (routine x 2)     Status: None (Preliminary result)   Collection Time: 12/12/21 10:27 PM   Specimen: BLOOD  Result Value Ref Range Status   Specimen Description BLOOD LEFT WRIST  Final   Special Requests   Final    BOTTLES DRAWN AEROBIC AND ANAEROBIC Blood Culture adequate volume   Culture   Final    NO GROWTH 2 DAYS Performed at [facility], [address], [place], [place] [address]    Report Status PENDING  Incomplete    Coagulation Studies: Recent Labs    12/12/21 0546  LABPROT 13.2  INR 1.0     Urinalysis: No results for input(s): "COLORURINE", "LABSPEC", "PHURINE", "GLUCOSEU", "HGBUR", "BILIRUBINUR", "KETONESUR", "PROTEINUR", "UROBILINOGEN", "NITRITE", "LEUKOCYTESUR" in the last 72 hours.  Invalid input(s): "APPERANCEUR"     Imaging: No results found.   Medications:    ceFEPime (MAXIPIME) IV 1 g (12/13/21 0947)   metronidazole 500 mg (12/14/21 2505)   vancomycin       (feeding supplement) PROSource Plus  30 mL Oral TID BM   acetaminophen       vitamin C  500 mg Oral BID   aspirin EC  81 mg Oral Daily   atorvastatin  20 mg Oral Daily   Chlorhexidine Gluconate Cloth  6 each Topical Daily   feeding supplement (NEPRO CARB STEADY)  237 mL Oral TID BM   heparin  5,000  Units Subcutaneous Q8H   insulin aspart  0-5 Units Subcutaneous QHS   insulin aspart  0-9 Units Subcutaneous TID WC   leptospermum manuka honey  1 Application Topical Daily   levothyroxine  125 mcg Oral Q0600  lipase/protease/amylase  12,000 Units Oral TID WC   losartan  50 mg Oral Daily   multivitamin  1 tablet Oral QHS   sevelamer carbonate  800 mg Oral TID WC   zinc sulfate  220 mg Oral Daily   acetaminophen, acetaminophen, albuterol, dextromethorphan-guaiFENesin, diclofenac Sodium, fluticasone, hydrALAZINE, iohexol, ondansetron (ZOFRAN) IV, oxyCODONE-acetaminophen  Assessment/ Plan:  Ms. Jaquana Geiger is a 56 y.o.  female with past medical history including depression, type 2 diabetes, asthma, hypertension, hypothyroidism, CLL, and end-stage renal disease on hemodialysis.  [facility]/MWF/right PermCath  end-stage renal disease on hemodialysis.    -Receiving scheduled treatment today, UF goal 1L as tolerated. Next treatment scheduled for Wednesday  2. Anemia of chronic kidney disease Lab Results  Component Value Date   HGB 8.0 (L) 12/14/2021  Patient receives [place] outpatient. Hgb below target, will continue to monitor.   3. Secondary Hyperparathyroidism: with outpatient labs: PTH 235, phosphorus 6.7, calcium 9.0 on 11/30/2021.   Lab Results  Component Value Date   CALCIUM 9.1 12/14/2021   PHOS 3.2 11/28/2020    Will continue to monitor bone minerals during this admission.  4. 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 Home regimen includes lispro. Most recent hemoglobin A1c is 4.9 on 12/09/2021.   Well controlled by primary team  5.  Syncopal episode during dialysis treatment.  Cardiology consulted due to elevated troponins overnight.  Echo shows EF 55 to 60%.  Home cardiac monitor remains in place from previous admission.    6.  Nausea/sacral wound/retained stool/C. difficile positive. Management as per  internal medicine team.  Currently has received aztreonam, cefepime, metronidazole and vancomycin IV -Received soap suds enema for fecal impaction. -General surgery consulted and will defer I&D.  - WOC following    LOS: 2 [place] 8/21/20231:04 PM

## 2021-12-14 NOTE — Consult Note (Signed)
/  Pharmacy Antibiotic Note  Tonya Myers is a 56 y.o. female admitted on 12/12/2021 with sepsis. PMH significant for ESRD on HD (MWF), anxiety, asthma, CLL, depression, T2DM, HTN, s. pneumo meningitis with epidural abscess, thoracolumbar discitis, and hypothyroidism. Treated for MDR pseudomonas in blood and urine 11/2020. Hx of penicillin allergy, but has tolerated penicillins and beta-lactams in the past. Pharmacy has been consulted for vancomycin and cefepime dosing.  Plan: Continue 750 mg IV after HD sessions. Goal vanc level 15-25 mcg/mL.  Continue cefepime 1g Q24H.  Weight: 63.7 kg (140 lb 6.9 oz)  Temp (24hrs), Avg:98.5 F (36.9 C), Min:98 F (36.7 C), Max:98.8 F (37.1 C)  Recent Labs  Lab 12/10/21 0527 12/11/21 0614 12/12/21 0546 12/13/21 0630 12/14/21 0637  WBC 6.3 3.5* 4.2 3.2* 3.7*  CREATININE 3.75* 5.69* 3.31* 4.77* 5.96*  LATICACIDVEN  --   --  1.4  --   --      Estimated Creatinine Clearance: 10.3 mL/min (A) (by C-G formula based on SCr of 5.96 mg/dL (H)).    Allergies  Allergen Reactions   Amoxapine    Penicillins    Ceftazidime     Other reaction(s): Confusion, Hallucination, Unknown Encephalopathy - improved after dialysis/some concern for cephalosporin neurotoxicity Tolerated without confusion 06/2021. Encephalopathy - improved after dialysis/some concern for cephalosporin neurotoxicity Tolerated without confusion 06/2021.     Antimicrobials this admission: 12/12/2021 Aztreonam >> 12/13/2021 Cefepime >> 12/12/2021 Metronidazole >>  12/12/2021 Vancomycin >>   Dose adjustments this admission: N/A  Microbiology results: 12/12/2021 BCx: NGTD 12/10/2021 UCx: <10,000 colonies/ml insignificant growth  Thank you for allowing pharmacy to be a part of this patient's care.  Venora Maples, PharmD Candidate  12/14/21 11:22AM

## 2021-12-14 NOTE — Progress Notes (Signed)
PROGRESS NOTE  Tonya Myers QHU:[id number] DOB: 1965/06/28 DOA: 12/12/2021 PCP: [name], MD  Brief History   Tonya Myers is a 56 y.o. 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    Pt was recently hospitalized from 8/16 - 8/18 due to syncope.  Per report, dialysis staff performed 2 minutes of CPR prior to arrival to Ed. Pt had elevated trop up to  452.  Cardiology was consulted, suspected that her syncopal episode was vasovagal/hypotensive in nature, and troponin elevation was demand ischemia in that setting. 2 D echo on 8/17 showed LVEF of 55-60% without WMA's or other valvular pathologies. Pt also has had UTI and was started on Cipro at the discharge.    Patient states that after she went home, she has worsening weakness.  She has fever and chills.  She continues to have symptoms of UTI, including dysuria, burning on urination and urinary frequency.  She also reports nausea, diarrhea and mild lower abdominal pain.  No vomiting.  Patient has had 5 times of diarrhea.  Patient states that after she had CPR, she has central chest wall tenderness.  No shortness breath or cough.  Patient had dialysis on last Friday.   Data reviewed independently and ED Course: pt was found to have troponin level 257 (452 on 12/10/2021), WBC 4.2, lactic acid 1.4, INR 1.0, PTT 32, negative COVID PCR, lipase 92, normal liver function, potassium 4.1, bicarbonate of 26, creatinine 3.31, BUN 20.  Temperature 100.  Blood pressure 146/91, heart rate 52, RR 24, oxygen saturation 100% on room air.  Chest x-ray negative for infiltration.    CT  chest demonstrated a new decubitus ulceration overlying the coccyx with large area of subcutaneous increase in soft tissue infiltration with small focus of gas. Question of underlying osteomyelitis. There is also presacral soft tissue thickening indicative of infection. There is also a large volume of retained desiccated stool in the rectum with mild surrounding fat stranding. There is also fracture of the sternum. There is a small right pleural effusion and chronic pancreatitis. There is a 18m nodule in the periphery of the right upper lobe.  Patient is admitted to telemetry bed as inpatient.  General surgery was consulted to evaluate the patient's sacral wound for need for I&D. Dr. STerrence Dupontdoes not feel that it needs any surgical intervention.   [facility] negative for discitis or osteomyelitis or epidural abscess. There was multilevel degenerative changes of the spine, not significantly changed when compared with prior exam in 2022 and worstL5-S1. There is no high grade spinal canal stenosis or neuroforaminal narrowing. Consultants  General surgery  Procedures  None  Antibiotics   Anti-infectives (From admission, onward)    Start     Dose/Rate Route Frequency Ordered Stop   12/14/21 2200  valACYclovir (VALTREX) tablet 500 mg        500 mg Oral Daily 12/14/21 1547     12/14/21 1800  vancomycin (VANCOREADY) IVPB 750 mg/150 mL        750 mg 150 mL/hr over 60 Minutes Intravenous Every M-W-F (Hemodialysis) 12/14/21 1041     12/13/21 1000  ceFEPIme (  MAXIPIME) 1 g in sodium chloride 0.9 % 100 mL IVPB        1 g 200 mL/hr over 30 Minutes Intravenous Every 24 hours 12/12/21 0905     12/12/21 1900  metroNIDAZOLE (FLAGYL) IVPB 500 mg        500 mg 100 mL/hr over 60 Minutes Intravenous Every 12 hours 12/12/21 0753     12/12/21 0415  vancomycin (VANCOREADY) IVPB 1500 mg/300 mL        1,500 mg 150 mL/hr over 120 Minutes Intravenous  Once 12/12/21  0414 12/12/21 1023   12/12/21 0400  aztreonam (AZACTAM) 2 g in sodium chloride 0.9 % 100 mL IVPB        2 g 200 mL/hr over 30 Minutes Intravenous  Once 12/12/21 0354 12/12/21 0712   12/12/21 0400  metroNIDAZOLE (FLAGYL) IVPB 500 mg        500 mg 100 mL/hr over 60 Minutes Intravenous  Once 12/12/21 0354 12/12/21 0835   12/12/21 0400  vancomycin (VANCOCIN) IVPB 1000 mg/200 mL premix  Status:  Discontinued        1,000 mg 200 mL/hr over 60 Minutes Intravenous  Once 12/12/21 0354 12/12/21 0414      Subjective  The patient is resting in bed. No new complaints.   Objective   Vitals:  Vitals:   12/14/21 1230 12/14/21 1252  BP: (!) 159/93 (!) 159/93  Pulse: (!) 52 (!) 48  Resp: 13 12  Temp:    SpO2: 100% 99%   Exam:  Constitutional:  The patient is awake, alert, and oriented x 3. No acute distress. Respiratory:  No increased work of breathing. No wheezes, rales, or rhonchi No tactile fremitus Cardiovascular:  Regular rate and rhythm No murmurs, ectopy, or gallups. No lateral PMI. No thrills. Abdomen:  Abdomen is soft, non-tender, non-distended No hernias, masses, or organomegaly Normoactive bowel sounds.  Musculoskeletal:  No cyanosis, clubbing, or edema Skin:  No rashes, lesions, ulcers palpation of skin: no induration or nodules Neurologic:  CN 2-12 intact Sensation all 4 extremities intact Psychiatric:  Mental status Mood, affect appropriate Orientation to person, place, time  judgment and insight appear intact   I have personally reviewed the following:   Today's Data  Vitals  Lab Data  BMP CBC  Micro Data  Blood culture x 2  Imaging  CT chest abdomen and pelvis  Cardiology Data  EKG  Other Data    Scheduled Meds:  (feeding supplement) PROSource Plus  30 mL Oral TID BM   acetaminophen       vitamin C  500 mg Oral BID   aspirin EC  81 mg Oral Daily   atorvastatin  20 mg Oral Daily   Chlorhexidine Gluconate Cloth  6 each Topical Daily    feeding supplement (NEPRO CARB STEADY)  237 mL Oral TID BM   heparin  5,000 Units Subcutaneous Q8H   heparin sodium (porcine)       insulin aspart  0-5 Units Subcutaneous QHS   insulin aspart  0-9 Units Subcutaneous TID WC   leptospermum manuka honey  1 Application Topical Daily   levothyroxine  125 mcg Oral Q0600   lipase/protease/amylase  12,000 Units Oral TID WC   losartan  50 mg Oral Daily   multivitamin  1 tablet Oral QHS   sevelamer carbonate  800 mg Oral TID WC   valACYclovir  500 mg Oral Daily   zinc sulfate  220 mg Oral Daily   Continuous Infusions:  ceFEPime (  MAXIPIME) IV 1 g (12/14/21 1420)   metronidazole 500 mg (12/14/21 3664)   vancomycin      Principal Problem:   Diarrhea Active Problems:   Stercoral colitis   Sacral wound   UTI (urinary tract infection)   End-stage renal disease on hemodialysis (HCC)   Essential hypertension   Hypothyroidism   Dyslipidemia   Myocardial injury   Type II diabetes mellitus with renal manifestations (HCC)   Anemia in ESRD (end-stage renal disease) (HCC)   Chronic pancreatitis (HCC)   Asthma   Closed fracture of sternum   History of herpes genitalis   LOS: 2 days    A & P  Assessment and Plan: * Diarrhea Diarrhea due to fecal impaction resulting in stercoral colitis: Loose stools are the result of fecal impaction which will allow only loose stool by for excretion. CT has demonstrated a fecal impaction and stercoral impaction.  GI panel is negative. C Diff was positive for antigen, but negative for toxin and PCR. Will discontinue enteric precautions.  The patient is receiving cefepime and flagyl.   The patient had relief from fecal impaction after enema.  Will give miralax twice daily and senna at hs to prevent further constipation/impaction.     Stercoral colitis The patient is receiving cefepime and flagyl. Soap suds enema to resolve fecal impaction.  Sacral wound Patient has stage IV sacral wound with  infection, CT scan cannot completely rule out osteomyelitis. -Patient is on vancomycin, cefepime and Flagyl -CRP is negative -ESR is elevated at 42 -General surgery has consulted and states that there is no need for I &D.  -Wound care consult pending. -Offload coccyx as possible. -Blood culture has had no growth  UTI (urinary tract infection) Patient was discharged on Cipro yesterday, still has typical symptoms of a UTI - Broad antibiotics -Repeat urinalysis -Follow-up urine culture -Blood culture x 2 has had no growth.  End-stage renal disease on hemodialysis (HCC) - Consulted Dr. Candiss Norse for renal for dialysis -The patient had HD today. She has tolerated the procedure well.  Essential hypertension - IV hydralazine as needed -Cozaar -Will add scheduled hydralazine  Hypothyroidism - Synthroid  Dyslipidemia - Lipitor  Myocardial injury Troponin 257 (242 on 12/10/2021).  Patient has front chest wall tenderness on palpation, which is most likely due to sternal fracture. -Trend troponin -Follow-up FLP -Aspirin, Lipitor -A1c was 4.9 on 12/09/2021, will not repeat  Type II diabetes mellitus with renal manifestations (HCC) - Recent A1c 4.9, well controlled.  Patient taking Humalog at home -Sliding scale insulin  Anemia in ESRD (end-stage renal disease) (HCC) Hemoglobin stable 9.1 (7.9 on 12/11/2021) -Follow-up by CBC  Chronic pancreatitis ([place]) CT scan showed chronic pancreatitis.  Liver function normal. Lipase 96 -Start Creon  Asthma Stable -Bronchodilators  Closed fracture of sternum - Pain control: As needed Percocet and Tylenol -Incentive spirometry  History of herpes genitalis Continue suppressive valacyclovir as prior to admission.   I have seen and examined this patient myself. I have spent 34 minutes in her evaluation and care.  DVT prophylaxis: Heparin Code Status: Full Code Family Communication: Spouse at bedside Disposition Plan: home    Da Authement, DO [facility] Direct contact: see [email or web address]  7PM-7AM contact night coverage as above 12/14/2021, 4:03 PM  LOS: 1 day

## 2021-12-15 ENCOUNTER — Inpatient Hospital Stay: Payer: Medicare Other

## 2021-12-15 DIAGNOSIS — K5289 Other specified noninfective gastroenteritis and colitis: Secondary | ICD-10-CM | POA: Diagnosis not present

## 2021-12-15 DIAGNOSIS — R29818 Other symptoms and signs involving the nervous system: Secondary | ICD-10-CM | POA: Diagnosis not present

## 2021-12-15 DIAGNOSIS — S31000D Unspecified open wound of lower back and pelvis without penetration into retroperitoneum, subsequent encounter: Secondary | ICD-10-CM

## 2021-12-15 DIAGNOSIS — N186 End stage renal disease: Secondary | ICD-10-CM | POA: Diagnosis not present

## 2021-12-15 DIAGNOSIS — R197 Diarrhea, unspecified: Secondary | ICD-10-CM | POA: Diagnosis not present

## 2021-12-15 DIAGNOSIS — R531 Weakness: Secondary | ICD-10-CM | POA: Diagnosis not present

## 2021-12-15 DIAGNOSIS — R55 Syncope and collapse: Secondary | ICD-10-CM | POA: Diagnosis not present

## 2021-12-15 LAB — CBC WITH DIFFERENTIAL/PLATELET
Abs Immature Granulocytes: 0.02 10*3/uL (ref 0.00–0.07)
Basophils Absolute: 0 10*3/uL (ref 0.0–0.1)
Basophils Relative: 1 %
Eosinophils Absolute: 0.1 10*3/uL (ref 0.0–0.5)
Eosinophils Relative: 4 %
HCT: 24 % — ABNORMAL LOW (ref 36.0–46.0)
Hemoglobin: 7.8 g/dL — ABNORMAL LOW (ref 12.0–15.0)
Immature Granulocytes: 1 %
Lymphocytes Relative: 46 %
Lymphs Abs: 1.5 10*3/uL (ref 0.7–4.0)
MCH: 30.8 pg (ref 26.0–34.0)
MCHC: 32.5 g/dL (ref 30.0–36.0)
MCV: 94.9 fL (ref 80.0–100.0)
Monocytes Absolute: 0.3 10*3/uL (ref 0.1–1.0)
Monocytes Relative: 10 %
Neutro Abs: 1.3 10*3/uL — ABNORMAL LOW (ref 1.7–7.7)
Neutrophils Relative %: 38 %
Platelets: UNDETERMINED 10*3/uL (ref 150–400)
RBC: 2.53 MIL/uL — ABNORMAL LOW (ref 3.87–5.11)
RDW: 18 % — ABNORMAL HIGH (ref 11.5–15.5)
WBC: 3.3 10*3/uL — ABNORMAL LOW (ref 4.0–10.5)
nRBC: 0 % (ref 0.0–0.2)

## 2021-12-15 LAB — BASIC METABOLIC PANEL
Anion gap: 9 (ref 5–15)
BUN: 30 mg/dL — ABNORMAL HIGH (ref 6–20)
CO2: 26 mmol/L (ref 22–32)
Calcium: 8.9 mg/dL (ref 8.9–10.3)
Chloride: 104 mmol/L (ref 98–111)
Creatinine, Ser: 3.53 mg/dL — ABNORMAL HIGH (ref 0.44–1.00)
GFR, Estimated: 14 mL/min — ABNORMAL LOW (ref >60)
Glucose, Bld: 78 mg/dL (ref 70–99)
Potassium: 4.4 mmol/L (ref 3.5–5.1)
Sodium: 139 mmol/L (ref 135–145)

## 2021-12-15 LAB — HEPATITIS B SURFACE ANTIBODY, QUANTITATIVE: Hep B S AB Quant (Post): 23.7 m[IU]/mL (ref >9.9)

## 2021-12-15 LAB — HEPATITIS B E ANTIBODY
Hep B E Ab: NEGATIVE
Hep B E Ab: NEGATIVE

## 2021-12-15 LAB — GLUCOSE, CAPILLARY
Glucose-Capillary: 83 mg/dL (ref 70–99)
Glucose-Capillary: 86 mg/dL (ref 70–99)
Glucose-Capillary: 134 mg/dL — ABNORMAL HIGH (ref 70–99)
Glucose-Capillary: 117 mg/dL — ABNORMAL HIGH (ref 70–99)
Glucose-Capillary: 133 mg/dL — ABNORMAL HIGH (ref 70–99)

## 2021-12-15 MED ORDER — PREGABALIN 75 MG PO CAPS
75.0000 mg | ORAL_CAPSULE | Freq: Two times a day (BID) | ORAL | Status: DC
  Administered 2021-12-15: 75 mg via ORAL
  Filled 2021-12-15: qty 1

## 2021-12-15 NOTE — Consult Note (Signed)
[facility] TeleNeurology Consult Services  Stat Consult  Patient Name:   Tonya Myers, Boza Date of Birth:   [birth year] Identification Number:   MRN - [id number] Date of Service:   12/15/2021 21:07:30  Diagnosis:       R47.01 - Aphasia  Impression 56 year old female with a past medical history of ESRD on HD (MWF), ANC vasculitis, DM, HTN, HLD anxiety, asthma, CLL, depression type 2 DM, HTN and hypothyroidism, chornic weakness in b/l lower extremities and right arm admitted with weakness and fever found to have a diarrhea due to fecal impaction. When the patients nurse came in, speech was different, she was staring off into the wall and was not able to speak. Symptoms resolved. Differential: transient encephalopathy vs partial seizure, cannot rule out TIA.  Recommend CTH and MRI brain without contrast and routine EEG.   Recommendations: Our recommendations are outlined below.  Diagnostic Studies : MRI head without contrast Please order  Laboratory Studies : Lipid panel Please order Hemoglobin A1c Please order  Antithrombotic Medication : Aspirin 81 mg PO daily Please order  Nursing Recommendations : IV Fluids, avoid dextrose containing fluids, Maintain euglycemiaNeuro checks q4 hrs x 24 hrs and then per shiftHead of bed 30 degreesContinue with Telemetry  Consultations : Recommend Speech therapy if failed dysphagia screenPhysical therapy/Occupational therapy  ----------------------------------------------------------------------------------------------------    Metrics: [facility] Notification Time: 12/15/2021 21:05:00 Stamp Time: 12/15/2021 21:07:30 Callback Response Time: 12/15/2021 95:09:32  Primary Provider Notified of Diagnostic Impression and Management Plan on: 12/15/2021 21:46:56     ----------------------------------------------------------------------------------------------------  Chief Complaint: Staring off into space and  was not able to speak.  History of Present Illness: Patient is a 56 year old Female. 56 year old female with a past medical history of ESRD on HD (MWF), ANC vasculitis, DM, HTN, HLD anxiety, asthma, CLL, depression type 2 DM, HTN and hypothyroidism, chornic weakness in b/l lower extremities and right arm admitted with weakness and fever found to have a diarrhea due to fecal impaction. When the patients nurse came in, speech was different, she was staring off into the wall and was not able to speak. Nurse also reports that she had a left facial droop. Last known well time was at 7:15-7:20 PM. Symptoms resolved, and currently exam is significant for generalized weakness, and right arm numbness.      Medications:  No Anticoagulant use  Antiplatelet use: Yes aspirin Reviewed EMR for current medications  Allergies:  Reviewed  Social History: Drug Use: No  Family History:  There is no family history of premature cerebrovascular disease pertinent to this consultation  ROS : 14 Points Review of Systems was performed and was negative except mentioned in HPI.  Past Surgical History: There Is No Surgical History Contributory To Today's Visit    Examination: BP(162/80), Pulse(76), 1A: Level of Consciousness - Alert; keenly responsive + 0 1B: Ask Month and Age - Both Questions Right + 0 1C: Blink Eyes & Squeeze Hands - Performs Both Tasks + 0 2: Test Horizontal Extraocular Movements - Normal + 0 3: Test Visual Fields - No Visual Loss + 0 4: Test Facial Palsy (Use Grimace if Obtunded) - Normal symmetry + 0 5A: Test Left Arm Motor Drift - Drift, but doesn't hit bed + 1 5B: Test Right Arm Motor Drift - Drift, but doesn't hit bed + 1 6A: Test Left Leg Motor Drift - No Effort Against Gravity + 3 6B: Test Right Leg Motor Drift - No Effort Against Gravity + 3 7: Test  Limb Ataxia (FNF/Heel-Shin) - No Ataxia + 0 8: Test Sensation - Complete Loss: Cannot Sense Being Touched At All + 2 9: Test  Language/Aphasia - Normal; No aphasia + 0 10: Test Dysarthria - Normal + 0 11: Test Extinction/Inattention - No abnormality + 0  NIHSS Score: 10  Spoke with : Hospitalist.    Patient / Family was informed the Neurology Consult would occur via TeleHealth consult by way of interactive audio and video telecommunications and consented to receiving care in this manner.  Patient is being evaluated for possible acute neurologic impairment and high probability of imminent or life - threatening deterioration.I spent total of 35 minutes providing care to this patient, including time for face to face visit via telemedicine, review of medical records, imaging studies and discussion of findings with providers, the patient and / or family.   Dr Jessica Priest   [facility] For Inpatient follow-up with [facility] physician please call [facility] [phone]. This is not an outpatient service. Post hospital discharge, please contact hospital directly.

## 2021-12-15 NOTE — Assessment & Plan Note (Deleted)
Code stroke on 12/15/2021.  CT head negative for hemorrhage or mass effect.  Teleneurology consulted and opined transient encephalopathy cannot rule out TIA with recommendation for MRI and EEG.  Stated patient already on aspirin so no other recommendations.

## 2021-12-15 NOTE — Progress Notes (Addendum)
Significant Event Progress Note   Patient: Tonya Myers PPJ:[id number] DOB: November 24, 1965 DOA: 12/12/2021     3 DOS: the patient was seen and examined on 12/15/2021         CROSS COVER NOTE #1  NAME: Tonya Myers MRN: [id number] DOB : 04/03/1966    Date of Service   12/15/21  HPI/Events of Note   Paged  for possible Code stroke: patient noted to have slurred speech, and weakness of the left arm with decreased grip strength.  Regained grip strength of the left arm after 15 minutes but continued to be dysarthric. Prior known bilateral lower extremity weakness and right arm weakness   Assessment and  Interventions   Assessment: Patient  mildly dysarthric on exam.  Left arm weakness appears resolved.  NIH score of 1 at the time of my evaluation  Plan: Proceed with code stroke work-up, stat CT head teleneurology consult ......  CT head negative for hemorrhage or mass effect.  Teleneurology consulted and opined transient encephalopathy cannot rule out TIA with recommendation for MRI and EEG.  Stated patient already on aspirin so no other recommendations          CROSS COVER NOTE #2    Date of Service   12/15/21  HPI/Events of Note   Secure chat: "shes back to slurring her speech, shes not able to smile, increasing weakness to her left side. she completed the stroke telly neuro and they stated it was generalized weakness from prior stroke. please advise"  Assessment and  Interventions   Assessment: Patient awake, with flat depressed affect.  Not participating in NIH testing.  Able to converse with patient.  She says she is tired of it all.  She does not want any testing at this time.  She says she has been going through this for the past 2 years when she is just not getting better.  She is very tearful.  She is articulating clearly.  No facial droop noted.  No additional weakness noted. NIHSS 0. She is declining additional testing for now.  Advance planning options discussed and  she remains a full code Suspect transient encephalopathy along with depression related to general medical condition Plan: We will continue with prior orders We will get MRI when patient is up to it ready.  She is declining at this time Consider antidepressant        Brief hospital course: No notes on file20 y.o. 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    Pt was recently hospitalized from 8/16 - 8/18 due to syncope.  Per report, dialysis staff performed 2 minutes of CPR prior to arrival to Ed. Pt had elevated trop up to  452.  Cardiology was consulted, suspected that her syncopal episode was vasovagal/hypotensive in nature, and troponin elevation was demand ischemia in that setting. 2 D echo on 8/17 showed LVEF of 55-60% without WMA's or other valvular pathologies. Pt also has had UTI and was started on Cipro at the discharge.    Patient states that after she went home, she has worsening weakness.  She has fever and chills.  She continues to have symptoms of UTI, including dysuria, burning  on urination and urinary frequency.  She also reports nausea, diarrhea and mild lower abdominal pain.  No vomiting.  Patient has had 5 times of diarrhea.    C. difficile assay negative.  Antigen positive toxin negative.  Diarrhea has resolved.  No clinical indications of acute C. difficile infection.  Sacral decubitus ulcer has been evaluated by surgery.  No surgical intervention warranted.  Nephrology following for inpatient hemodialysis needs.  Assessment and Plan:  Transient neurologic deficit night of 12/15/2021 Code stroke  on 12/15/2021.  CT head negative for hemorrhage or mass effect, discussed with radiologist. Seen by teleneurology and discussed on pnone: opined transient encephalopathy cannot rule out TIA with recommendation for MRI and EEG.  Stated patient already on aspirin so no other recommendations MRI and EEG ordered Formal note pending at this time.   Depression Consider initiation of antidepressant   Other comorbidities reviewed:  * Diarrhea Diarrhea due to fecal impaction resulting in stercoral colitis: Loose stools are  Stercoral colitis Sacral wound UTI (urinary tract infection) End-stage renal disease on hemodialysis (HCC) Essential hypertension Hypothyroidism Myocardial injury Type II diabetes mellitus with renal manifestations (HCC) Anemia in ESRD (end-stage renal disease) (HCC) Chronic pancreatitis ([place]) Asthma Closed fracture of sternum    Physical Exam: Vitals:   12/15/21 0806 12/15/21 1545 12/15/21 1959 12/15/21 1959  BP: 137/86 125/72 (!) 162/80 (!) 162/80  Pulse: 65 78 76 76  Resp:   17 18  Temp: 98.2 F (36.8 C) 98.2 F (36.8 C) 98.4 F (36.9 C) 98.4 F (36.9 C)  TempSrc:   Axillary   SpO2: 100% 100% 98% 97%  Weight:       Physical Exam Vitals and nursing note reviewed.  Constitutional:      General: She is awake. She is not in acute distress.    Appearance: She is underweight.  HENT:     Head: Normocephalic and atraumatic.  Cardiovascular:     Rate and Rhythm: Normal rate and regular rhythm.     Heart sounds: Normal heart sounds.  Pulmonary:     Effort: Pulmonary effort is normal.     Breath sounds: Normal breath sounds.  Abdominal:     Palpations: Abdomen is soft.     Tenderness: There is no abdominal tenderness.  Neurological:     Mental Status: She is oriented to person, place, and time. Mental status is at baseline.     Comments: Mild dysarthria.  Baseline weakness lower extremities and right arm  Psychiatric:        Mood and Affect: Mood is  depressed.        Speech: Speech is not slurred.        Behavior: Behavior is slowed.     Data Reviewed: EXAM: CT HEAD WITHOUT CONTRAST   TECHNIQUE: Contiguous axial images were obtained from the base of the skull through the vertex without intravenous contrast.   RADIATION DOSE REDUCTION: This exam was performed according to the departmental dose-optimization program which includes automated exposure control, adjustment of the mA and/or kV according to patient size and/or use of iterative reconstruction technique.   COMPARISON:  None Available.   FINDINGS: Brain: There is no mass, hemorrhage or extra-axial collection. The size and configuration of the ventricles and extra-axial CSF spaces are normal. There is hypoattenuation of the periventricular white matter, most commonly indicating chronic ischemic microangiopathy.   Vascular: No abnormal hyperdensity of the major intracranial arteries or dural venous sinuses. No intracranial atherosclerosis.   Skull: The visualized skull  base, calvarium and extracranial soft tissues are normal.   Sinuses/Orbits: No fluid levels or advanced mucosal thickening of the visualized paranasal sinuses. No mastoid or middle ear effusion. The orbits are normal.   ASPECTS [facility] Stroke Program Early CT Score)   - Ganglionic level infarction (caudate, lentiform nuclei, internal capsule, insula, M1-M3 cortex): 7   - Supraganglionic infarction (M4-M6 cortex): 3   Total score (0-10 with 10 being normal): 10   IMPRESSION: 1. No acute hemorrhage or mass lesion. 2. ASPECTS is 10.   These results were called by telephone at the time of interpretation on 12/15/2021 at 9:02 pm to Dr. Damita Dunnings, Who verbally acknowledged these results.     Electronically Signed   By: Ulyses Jarred M.D.   On: 12/15/2021 21:06   CRITICAL CARE Performed by: Athena Masse   Total critical care time: 120 minutes  Critical care time was exclusive of  separately billable procedures and treating other patients.  Critical care was necessary to treat or prevent imminent or life-threatening deterioration.  Critical care was time spent personally by me on the following activities: development of treatment plan with patient and/or surrogate as well as nursing, discussions with consultants, evaluation of patient's response to treatment, examination of patient, obtaining history from patient or surrogate, ordering and performing treatments and interventions, ordering and review of laboratory studies, ordering and review of radiographic studies, pulse oximetry and re-evaluation of patient's condition.    Time spent: 120 minutes  Author: Athena Masse, MD 12/15/2021 8:44 PM  For on call review [email or web address].

## 2021-12-15 NOTE — Consult Note (Addendum)
Pharmacy Antibiotic Note  Tonya Myers is a 56 y.o. female admitted on 12/12/2021 with  sacral wound infection . PMH significant for ESRD on HD (MWF), anxiety, asthma, CLL, depression, T2DM, HTN and hypothyroidism. Patient also has a history of MDR pseudomonas UTI/bacteremia in 11/2020. Patient has listed allergy to PCN, but the nature of the reaction is unclear and patient has tolerated penicillins and beta-lactams in the past. 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 Day 4 of antibiotics. Pharmacy has been consulted for cefepime dosing.  Plan: Continue Vancomycin 750 mg IV after HD sessions MWF. Goal vanc level 15-25 mcg/mL. Continue cefepime 1g Q24H.  Addendum: Vancomycin per pharmacy has been discontinued due to low suspicion for wound infection. Follow up plan for cefepime and flagyl.  Weight: 62.3 kg (137 lb 5.6 oz)  Temp (24hrs), Avg:98.8 F (37.1 C), Min:98.2 F (36.8 C), Max:99.6 F (37.6 C)  Recent Labs  Lab 12/11/21 0614 12/12/21 0546 12/13/21 0630 12/14/21 0637 12/15/21 0618  WBC 3.5* 4.2 3.2* 3.7* 3.3*  CREATININE 5.69* 3.31* 4.77* 5.96* 3.53*  LATICACIDVEN  --  1.4  --   --   --     Estimated Creatinine Clearance: 17.1 mL/min (A) (by C-G formula based on SCr of 3.53 mg/dL (H)).    Allergies  Allergen Reactions   Amoxapine    Penicillins    Ceftazidime     Other reaction(s): Confusion, Hallucination, Unknown Encephalopathy - improved after dialysis/some concern for cephalosporin neurotoxicity Tolerated without confusion 06/2021. Encephalopathy - improved after dialysis/some concern for cephalosporin neurotoxicity Tolerated without confusion 06/2021.     Antimicrobials this admission: 12/12/2021 Aztreonam >> 12/13/2021 Cefepime >> 12/12/2021 Metronidazole >>  12/12/2021 Vancomycin >> 12/15/2021  Dose adjustments this admission: N/A  Microbiology results: 12/12/2021 BCx: NG48H x2 12/12/2021 UCx: < 10k  colonies, insignificant growth   Thank you for allowing pharmacy to be a part of this patient's care.  Gretel Acre, PharmD PGY1 Pharmacy Resident 12/15/2021 11:55 AM

## 2021-12-15 NOTE — Evaluation (Signed)
Occupational Therapy Evaluation Patient Details Name: Tonya Myers MRN: [id number] DOB: 1966/01/22 Today's Date: 12/15/2021   History of Present Illness 56 y.o. 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   Clinical Impression   Patient presenting with decreased Ind in self care, balance, functional mobility/transfers, endurance, and safety awareness, and strength. Patient reports living at home with family and needing assist for functional mobility and self care tasks. She was able to ambulate short distances but has been getting progressively weaker. OT assisted pt to EOB to decrease friction on buttocks and pt stands with mod lifting assistance. Several side steps along EOB with mod A for safety and posterior bias noted. Patient will benefit from acute OT to increase overall independence in the areas of ADLs, functional mobility, and safety awareness in order to safely discharge to next venue of care.    Recommendations for follow up therapy are one component of a multi-disciplinary discharge planning process, led by the attending physician.  Recommendations may be updated based on patient status, additional functional criteria and insurance authorization.   Follow Up Recommendations  Skilled nursing-short term rehab (<3 hours/day)    Assistance Recommended at Discharge Frequent or constant Supervision/Assistance  Patient can return home with the following A lot of help with walking and/or transfers;A lot of help with bathing/dressing/bathroom;Assistance with cooking/housework;Assist for transportation     Functional Status Assessment  Patient has had a recent decline in their functional status and demonstrates the ability to make significant improvements in function in a reasonable and predictable amount of time.  Equipment Recommendations  Other (comment) (defer to next venue of care)       Precautions / Restrictions Precautions Precautions: Fall Precaution Comments: sacral wound      Mobility Bed Mobility Overal bed mobility: Needs Assistance Bed Mobility: Supine to Sit, Rolling Rolling: Mod assist   Supine to sit: Mod assist     General bed mobility comments: increased assistance given to decrease shearing on buttocks in bed    Transfers Overall transfer level: Needs assistance Equipment used: Rolling walker (2 wheels) Transfers: Sit to/from Stand, Bed to chair/wheelchair/BSC Sit to Stand: Mod assist Stand pivot transfers: Mod assist                Balance Overall balance assessment: Needs assistance Sitting-balance support: Feet supported, Bilateral upper extremity supported Sitting balance-Leahy Scale: Good Sitting balance - Comments: supervision   Standing balance support: Reliant on assistive device for balance, During functional activity, Bilateral upper extremity supported Standing balance-Leahy Scale: Poor                             ADL either performed or assessed with clinical judgement   ADL Overall ADL's : Needs assistance/impaired     Grooming: Wash/dry hands;Wash/dry face;Sitting;Minimal assistance               Lower Body Dressing: Maximal assistance;Total assistance   Toilet Transfer: Maximal assistance;Rolling walker (2 wheels) Toilet Transfer Details (indicate cue type and reason): stimulated                 Vision Patient Visual Report: No change  from baseline              Pertinent Vitals/Pain Pain Assessment Pain Assessment: Faces Faces Pain Scale: Hurts little more Pain Location: generalized - "all  over" Pain Descriptors / Indicators: Aching, Discomfort Pain Intervention(s): Monitored during session, Repositioned     Hand Dominance Right   Extremity/Trunk Assessment Upper Extremity Assessment Upper Extremity Assessment: Generalized weakness;RUE deficits/detail RUE Deficits / Details: 3/5 gross strength and significant decreased grip strength   Lower Extremity Assessment Lower Extremity Assessment: Defer to PT evaluation       Communication Communication Communication: No difficulties   Cognition Arousal/Alertness: Awake/alert Behavior During Therapy: WFL for tasks assessed/performed Overall Cognitive Status: Within Functional Limits for tasks assessed                                                  Home Living Family/patient expects to be discharged to:: Private residence Living Arrangements: Children;Other relatives Available Help at Discharge: Family;Available 24 hours/day Type of Home: House Home Access: Level entry     Home Layout: Two level;Able to live on main level with bedroom/bathroom     Bathroom Shower/Tub: Walk-in shower     Bathroom Accessibility: No   Home Equipment: Shower seat;Grab bars - toilet;Rolling Walker (2 wheels);Wheelchair - manual;Wheelchair - power;BSC/3in1;Hospital bed          Prior Functioning/Environment Prior Level of Function : Needs assist             Mobility Comments: Pt reports being able to ambulate short distances with RW ADLs Comments: minimal assistance with self care tasks        OT Problem List: Decreased strength;Cardiopulmonary status limiting activity;Decreased activity tolerance;Decreased safety awareness;Impaired balance (sitting and/or standing);Decreased knowledge of use of DME or AE;Decreased knowledge of precautions      OT Treatment/Interventions: Self-care/ADL training;Therapeutic exercise;Therapeutic activities;Energy conservation;DME and/or AE instruction;Manual  therapy;Balance training;Patient/family education    OT Goals(Current goals can be found in the care plan section) Acute Rehab OT Goals Patient Stated Goal: return to PLOF and get stronger OT Goal Formulation: With patient Time For Goal Achievement: 12/29/21 Potential to Achieve Goals: Fair ADL Goals Pt Will Perform Grooming: (P) with min guard assist;standing Pt Will Transfer to Toilet: (P) ambulating;with min guard assist;bedside commode Pt Will Perform Toileting - Clothing Manipulation and hygiene: (P) with min assist;sit to/from stand  OT Frequency: Min 2X/week       AM-PAC OT "6 Clicks" Daily Activity     Outcome Measure Help from another person eating meals?: A Little Help from another person taking care of personal grooming?: A Little Help from another person toileting, which includes using toliet, bedpan, or urinal?: A Lot Help from another person bathing (including washing, rinsing, drying)?: A Lot Help from another person to put on and taking off regular upper body clothing?: A Little Help from another person to put on and taking off regular lower body clothing?: A Lot 6 Click Score: 15   End of Session Equipment Utilized During Treatment: Rolling walker (2 wheels)  Activity Tolerance: Patient limited by fatigue Patient left: in bed;with call bell/phone within reach;with bed alarm set;with family/visitor present  OT Visit Diagnosis: Unsteadiness on feet (R26.81);Muscle weakness (generalized) (M62.81)                Time: 1308-6578 OT Time Calculation (min): 29 min Charges:  OT General Charges $OT Visit: 1 Visit OT Evaluation $OT Eval Moderate Complexity: 1 Mod OT Treatments $Therapeutic Activity: 8-22 mins Darleen Crocker, MS, OTR/L , CBIS ascom [phone]  12/15/21, 3:43 PM

## 2021-12-15 NOTE — Evaluation (Signed)
Physical Therapy Evaluation Patient Details Name: Tonya Myers MRN: [id number] DOB: 1965-05-11 Today's Date: 12/15/2021  History of Present Illness  56 y.o. 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  Clinical Impression  Pt is very weak and though she showed great attitude and effort with PT she is extremely functionally limited. She reports she has been bed bound for the last month+, and had not really done a lot of walking for quite a while but she reports being able to do some limited (<20 ft) heavily assisted ambulation with HHPT when she first came home from rehab.  Pt very motivated to get to rehab and try to work back to some level of mobility as she has recently been bed bound.    Recommendations for follow up therapy are one component of a multi-disciplinary discharge planning process, led by the attending physician.  Recommendations may be updated based on patient status, additional functional criteria and insurance authorization.  Follow Up Recommendations Skilled nursing-short term rehab (<3 hours/day) Can patient physically be transported by private vehicle: No    Assistance Recommended at Discharge Frequent or constant Supervision/Assistance  Patient can return home with the following  Two people to help with walking and/or transfers;Two people to help with bathing/dressing/bathroom;Assistance with cooking/housework;Assist for transportation    Equipment Recommendations  (pressure relief mattress)  Recommendations for Other Services       Functional Status Assessment Patient has had a recent  decline in their functional status and demonstrates the ability to make significant improvements in function in a reasonable and predictable amount of time.     Precautions / Restrictions Precautions Precautions: Fall Precaution Comments: sacral wound Restrictions Weight Bearing Restrictions: No      Mobility  Bed Mobility Overal bed mobility: Needs Assistance Bed Mobility: Supine to Sit, Sit to Supine Rolling: Min assist   Supine to sit: Min assist     General bed mobility comments: Pt needed UE/rail/HHA to get up to sitting EOB, laboriously uses UEs to get LEs back up into bed, assist from PT to scoot toward center of bed once LEs (barely) in bed    Transfers Overall transfer level: Needs assistance Equipment used: Rolling walker (2 wheels) Transfers: Sit to/from Stand Sit to Stand: From elevated surface, Max assist           General transfer comment: Pt with extremely weak LEs, even from very elevated bed she was unable to lift hips off bed at all on self attempt.  With max assist from PT we briefly attained standing but pt's knees buckled and she collapsed back onto the bed. Heavy UE reliance and even more so, assist from PT.    Ambulation/Gait               General Gait Details: unsafe to attempt  Stairs            Wheelchair Mobility    Modified Rankin (Stroke Patients Only)       Balance Overall balance assessment: Needs assistance Sitting-balance support: Feet supported, Bilateral upper extremity supported Sitting balance-Leahy Scale: Good Sitting balance - Comments: supervision   Standing balance support: Reliant on assistive device for balance, During functional  activity, Bilateral upper extremity supported Standing balance-Leahy Scale: Zero Standing balance comment: pt lacks the LE strength to sustain standing                             Pertinent Vitals/Pain Pain Assessment Faces Pain Scale: Hurts little more Pain  Location: general soreness    Home Living Family/patient expects to be discharged to:: Private residence Living Arrangements: Children;Other relatives Available Help at Discharge: Family;Available 24 hours/day Type of Home: House Home Access: Level entry       Home Layout: Two level;Able to live on main level with bedroom/bathroom Home Equipment: Shower seat;Grab bars - toilet;Rolling Walker (2 wheels);Wheelchair - manual;Wheelchair - power;BSC/3in1;Hospital bed [facility] Lift)      Prior Function Prior Level of Function : Needs assist             Mobility Comments: Pt reports being able to ambulate short distances (<20 ft with PT assist and w/c follow) with RW when she left rehab a few months ago ADLs Comments: minimal assistance with self care tasks     Hand Dominance   Dominant Hand: Right    Extremity/Trunk Assessment   Upper Extremity Assessment Upper Extremity Assessment: Generalized weakness (R UE weaker than L, L shld elevation to 90, no AROM elevation on R, poor grip b/l) RUE Deficits / Details: 3/5 gross strength and significant decreased grip strength    Lower Extremity Assessment Lower Extremity Assessment: Generalized weakness (b/l quads <3/5, limited AROM R ankle with no L ankle AROM and resting inversion/pronation)       Communication   Communication: No difficulties  Cognition Arousal/Alertness: Awake/alert Behavior During Therapy: WFL for tasks assessed/performed Overall Cognitive Status: Within Functional Limits for tasks assessed                                          General Comments      Exercises General Exercises - Lower Extremity Ankle Circles/Pumps: PROM, AAROM (AAROM on R, PROM on L) Long Arc Quad: AROM, AAROM, 10 reps (limited range and force outpt) Heel Slides: AROM, 5 reps Hip ABduction/ADduction: Strengthening, 10 reps   Assessment/Plan    PT Assessment Patient needs continued PT services  PT Problem List  Decreased strength;Decreased activity tolerance;Decreased safety awareness;Decreased range of motion;Decreased balance;Decreased mobility;Decreased knowledge of use of DME       PT Treatment Interventions DME instruction;Gait training;Functional mobility training;Therapeutic activities;Therapeutic exercise;Balance training;Neuromuscular re-education;Patient/family education;Wheelchair mobility training    PT Goals (Current goals can be found in the Care Plan section)  Acute Rehab PT Goals Patient Stated Goal: walk PT Goal Formulation: With patient Time For Goal Achievement: 12/28/21 Potential to Achieve Goals: Poor    Frequency Min 2X/week     Co-evaluation               AM-PAC PT "6 Clicks" Mobility  Outcome Measure Help needed turning from your back to your side while in a flat bed without using bedrails?: A Little Help needed moving from lying on your back to sitting on the side of a flat bed without using bedrails?: A Little Help needed moving to and from a bed to a chair (including a wheelchair)?: Total Help needed standing up from a chair using your arms (e.g., wheelchair or bedside chair)?: Total Help needed to walk in hospital room?: Total Help needed  climbing 3-5 steps with a railing? : Total 6 Click Score: 10    End of Session Equipment Utilized During Treatment: Gait belt Activity Tolerance: Patient limited by fatigue;Patient tolerated treatment well Patient left: with bed alarm set;with call bell/phone within reach Nurse Communication: Mobility status PT Visit Diagnosis: Muscle weakness (generalized) (M62.81);Difficulty in walking, not elsewhere classified (R26.2)    Time: 2194-7125 PT Time Calculation (min) (ACUTE ONLY): 37 min   Charges:   PT Evaluation $PT Eval Low Complexity: 1 Low PT Treatments $Therapeutic Exercise: 8-22 mins $Therapeutic Activity: 8-22 mins        Kreg Shropshire, DPT 12/15/2021, 4:44 PM

## 2021-12-15 NOTE — Progress Notes (Signed)
Rapid Response Event Note   Reason for Call : stroke like symptoms   Initial Focused Assessment: On my arrival, pt was staring at wall and seemed to be dazed out. When I called her name she could look at me and answer all of my orientation questions appropriately but has slurred speech. The primary RN who has had pt for 2 nights prior states that pt is much different tonight than she has been. She noticed slurred speech, pt with blank stare, and pt began drooling from mouth. Pt does have right sided weakness at baseline and cannot walk. On my initial assessment, pt can grip with right hand but is very weak and pt cannot grip with left hand at all. When asked to smile, pt has obvious left sided facial droop. Pt states that she feels like she is also biting her tongue on the left side. Last known well time per primary RN is 7:15pm.   Interventions: Code stroke called at 8:07pm. Dr. Damita Dunnings notified and arrived at bedside. After Dr. Damita Dunnings assessed pt, she wanted to go ahead with code stroke. Head CT ordered and performed. VSS. CBG WNL. STAT teleneuro consult ordered and pt was assessed by teleneurologist.    Plan of Care: Pt will remain on 1A at this time. On examination with teleneuro, pt was found to have generalized weakness in all extremities. Primary RN expressed concern of persistent left sided weakness in arm/hand. She states that pt has been able to do everything with her left hand the last 2 nights and pt was hardly able to lift arm or grip anything at this time. Concerns about pt's delayed responses during assessment were also relayed to teleneurologist. Per teleneuro, no other interventions needed at this time. Will follow up with patient and RN shortly. All VS have remained stable.    Event Summary:   MD Notified: Dr. Damita Dunnings Call Time: 1955 Arrival Time: 1957 End Time: 2145  Trellis Paganini, RN

## 2021-12-15 NOTE — Progress Notes (Signed)
PROGRESS NOTE    Tonya Myers  ZOX:[id number] DOB: 08-19-1965 DOA: 12/12/2021 PCP: [name], MD    Brief Narrative:  56 y.o. 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    Pt was recently hospitalized from 8/16 - 8/18 due to syncope.  Per report, dialysis staff performed 2 minutes of CPR prior to arrival to Ed. Pt had elevated trop up to  452.  Cardiology was consulted, suspected that her syncopal episode was vasovagal/hypotensive in nature, and troponin elevation was demand ischemia in that setting. 2 D echo on 8/17 showed LVEF of 55-60% without WMA's or other valvular pathologies. Pt also has had UTI and was started on Cipro at the discharge.    Patient states that after she went home, she has worsening weakness.  She has fever and chills.  She continues to have symptoms of UTI, including dysuria, burning on urination and urinary frequency.  She also reports nausea, diarrhea and mild lower abdominal pain.  No vomiting.  Patient has had 5 times of diarrhea.   C. difficile assay negative.  Antigen positive toxin negative.  Diarrhea has resolved.  No clinical indications of acute C. difficile infection.  Sacral decubitus ulcer has been evaluated by surgery.  No surgical intervention warranted.  Nephrology following for inpatient hemodialysis needs.   Assessment & Plan:   Principal Problem:   Diarrhea Active Problems:   Stercoral colitis   Sacral wound   UTI (urinary tract infection)   End-stage renal disease on hemodialysis (HCC)   Essential hypertension   Hypothyroidism   Dyslipidemia   Myocardial  injury   Type II diabetes mellitus with renal manifestations (HCC)   Anemia in ESRD (end-stage renal disease) (HCC)   Chronic pancreatitis (HCC)   Asthma   Closed fracture of sternum   History of herpes genitalis  Stercoral colitis Diarrhea Patient presented with diarrhea in the setting of fecal impaction resulting in stercoral colitis.  Bowel regimen with disimpaction was effective and now patient is having bowel movements. Plan: Continue cefepime and Flagyl for today Bowel regimen Can likely de-escalate/discontinue antibiotics tomorrow Discontinue isolation precautions  Stage IV sacral decubitus ulcer Seen and evaluated by general surgery.  No indication for surgical intervention at this time Wound care consult appreciated Plan: Discontinue vancomycin Cefepime and Flagyl Can likely further de-escalate antibiotics tomorrow Wound care instructions on more Therapy as tolerated  Possible urinary tract infection Patient was discharged on ciprofloxacin 1 day prior to presentation Still has typical symptoms of UTI On IV antibiotics as above  ESRD on HD Nephrology following for inpatient HD needs  Essential hypertension Poor control Continue PTA Cozaar Scheduled hydralazine RN IV hydralazine  Hypothyroidism PTA Synthroid  Elevated troponins No significant delta Low suspicion for ACS  Type II diabetes mellitus with renal manifestations (HCC) Sliding scale Last A1c 4.9, well controlled  Anemia of chronic kidney disease Hemoglobin stable  Chronic pancreatitis Normal LFTs and lipase Creon started this admission  History of asthma Stable, unclear severity P.o. and bronchodilators  Closed fracture of sternum No indication for surgical intervention Multimodal pain control  History of herpes genitalis PTA suppressive valacyclovir    DVT prophylaxis: SQ heparin Code Status: Full  Family Communication: [name] [phone] on 8/22 Disposition Plan:  Status is: Inpatient Remains inpatient appropriate because: Sacral wound on IV antibiotics   Level of care: Telemetry Medical  Consultants:  Nephrology  Procedures:  None  Antimicrobials: Cefepime Flagyl  Subjective: Patient seen and examined.  Reports fatigue.  Otherwise pain well controlled  Objective: Vitals:   12/14/21 1654 12/14/21 2324 12/15/21 0500 12/15/21 0806  BP: (!) 151/90 (!) 173/85  137/86  Pulse: (!) 59 65  65  Resp: 16 17    Temp: 99.6 F (37.6 C) 98.6 F (37 C)  98.2 F (36.8 C)  TempSrc: Oral     SpO2: 100% 100%  100%  Weight:   62.3 kg     Intake/Output Summary (Last 24 hours) at 12/15/2021 1321 Last data filed at 12/15/2021 0300 Gross per 24 hour  Intake 630 ml  Output --  Net 630 ml   Filed Weights   12/14/21 0904 12/14/21 1252 12/15/21 0500  Weight: 63.7 kg 62.9 kg 62.3 kg    Examination:  General exam: No distress Respiratory system: Lungs clear.  Normal work of breathing.  Room air Cardiovascular system: S2, RRR, no murmurs, no pedal edema Gastrointestinal system: Soft, NT/ND, normal bowel sounds Central nervous system: Alert and oriented. No focal neurological deficits. Extremities: Decreased power bilateral lower extremities Skin: Stage IV sacral decubitus Psychiatry: Judgement and insight appear normal. Mood & affect appropriate.     Data Reviewed: I have personally reviewed following labs and imaging studies  CBC: Recent Labs  Lab 12/09/21 1548 12/10/21 0527 12/11/21 0614 12/12/21 0546 12/13/21 0630 12/14/21 0637 12/15/21 0618  WBC 4.4   < > 3.5* 4.2 3.2* 3.7* 3.3*  NEUTROABS 1.3*  --   --  1.7  --  1.6* 1.3*  HGB 8.7*   < > 7.9* 9.1* 8.0* 8.0* 7.8*  HCT 28.0*   < > 24.8* 29.0* 25.8* 25.2* 24.0*  MCV 94.6   < > 95.4 95.1 95.9 95.8 94.9  PLT 184   < > 182 206 169 172 PLATELET CLUMPS NOTED ON SMEAR, UNABLE TO ESTIMATE   < > = values in this interval not displayed.   Basic Metabolic Panel: Recent Labs  Lab  12/09/21 2329 12/10/21 0527 12/10/21 1044 12/11/21 0614 12/12/21 0546 12/13/21 0630 12/14/21 0637 12/15/21 0618  NA  --    < >  --  139 139 141 141 139  K  --    < >  --  3.9 4.1 3.9 4.2 4.4  CL  --    < >  --  106 102 106 106 104  CO2  --    < >  --  '24 26 25 23 26  '$ GLUCOSE  --    < >  --  80 82 78 100* 78  BUN  --    < >  --  39* 20 32* 47* 30*  CREATININE  --    < >  --  5.69* 3.31* 4.77* 5.96* 3.53*  CALCIUM  --    < >  --  9.0 9.2 9.0 9.1 8.9  MG 1.9  --  2.1  --   --   --   --   --    < > = values in this interval not displayed.   GFR: Estimated Creatinine Clearance: 17.1 mL/min (A) (by C-G formula based on SCr of 3.53 mg/dL (H)). Liver Function Tests: Recent Labs  Lab 12/12/21 0546  AST 34  ALT  25  ALKPHOS 69  BILITOT 0.7  PROT 6.1*  ALBUMIN 3.3*   Recent Labs  Lab 12/12/21 0546 12/13/21 0630  LIPASE 96* 77*   No results for input(s): "AMMONIA" in the last 168 hours. Coagulation Profile: Recent Labs  Lab 12/10/21 0130 12/12/21 0546  INR 1.1 1.0   Cardiac Enzymes: No results for input(s): "CKTOTAL", "CKMB", "CKMBINDEX", "TROPONINI" in the last 168 hours. BNP (last 3 results) No results for input(s): "PROBNP" in the last 8760 hours. HbA1C: No results for input(s): "HGBA1C" in the last 72 hours. CBG: Recent Labs  Lab 12/14/21 1653 12/14/21 2105 12/15/21 0737 12/15/21 0808 12/15/21 1152  GLUCAP 151* 73 83 86 134*   Lipid Profile: No results for input(s): "CHOL", "HDL", "LDLCALC", "TRIG", "CHOLHDL", "LDLDIRECT" in the last 72 hours. Thyroid Function Tests: No results for input(s): "TSH", "T4TOTAL", "FREET4", "T3FREE", "THYROIDAB" in the last 72 hours. Anemia Panel: No results for input(s): "VITAMINB12", "FOLATE", "FERRITIN", "TIBC", "IRON", "RETICCTPCT" in the last 72 hours. Sepsis Labs: Recent Labs  Lab 12/12/21 0546  PROCALCITON 0.82  LATICACIDVEN 1.4    Recent Results (from the past 240 hour(s))  Urine Culture     Status: Abnormal    Collection Time: 12/10/21  9:00 PM   Specimen: Urine, Clean Catch  Result Value Ref Range Status   Specimen Description   Final    URINE, CLEAN CATCH Performed at [facility], [address], [place], [place] [address]    Special Requests   Final    NONE Performed at [facility], [address] [place], [place] [address]    Culture (A)  Final    <10,000 COLONIES/mL INSIGNIFICANT GROWTH Performed at [facility] [address], [place], [place] [address]    Report Status 12/12/2021 FINAL  Final  Resp Panel by RT-PCR (Flu A&B, Covid) Anterior Nasal Swab     Status: None   Collection Time: 12/12/21  4:28 AM   Specimen: Anterior Nasal Swab  Result Value Ref Range Status   SARS Coronavirus 2 by RT PCR NEGATIVE NEGATIVE Final    Comment: (NOTE) SARS-CoV-2 target nucleic acids are NOT DETECTED.  The SARS-CoV-2 RNA is generally detectable in upper respiratory specimens during the acute phase of infection. The lowest concentration of SARS-CoV-2 viral copies this assay can detect is 138 copies/mL. A negative result does not preclude SARS-Cov-2 infection and should not be used as the sole basis for treatment or other patient management decisions. A negative result may occur with  improper specimen collection/handling, submission of specimen other than nasopharyngeal swab, presence of viral mutation(s) within the areas targeted by this assay, and inadequate number of viral copies(<138 copies/mL). A negative result must be combined with clinical observations, patient history, and epidemiological information. The expected result is Negative.  Fact Sheet for Patients:  EntrepreneurPulse.com.au  Fact Sheet for Healthcare Providers:  IncredibleEmployment.be  This test is no t yet approved or cleared by the [place] FDA and  has been authorized for detection and/or diagnosis of SARS-CoV-2 by FDA under an Emergency Use  Authorization (EUA). This EUA will remain  in effect (meaning this test can be used) for the duration of the COVID-19 declaration under Section 564(b)(1) of the Act, 21 U.S.C.section 360bbb-3(b)(1), unless the authorization is terminated  or revoked sooner.       Influenza A by PCR NEGATIVE NEGATIVE Final   Influenza B by PCR NEGATIVE NEGATIVE Final    Comment: (NOTE) The Xpert Xpress SARS-CoV-2/FLU/RSV plus assay is  intended as an aid in the diagnosis of influenza from Nasopharyngeal swab specimens and should not be used as a sole basis for treatment. Nasal washings and aspirates are unacceptable for Xpert Xpress SARS-CoV-2/FLU/RSV testing.  Fact Sheet for Patients: EntrepreneurPulse.com.au  Fact Sheet for Healthcare Providers: IncredibleEmployment.be  This test is not yet approved or cleared by the [place] FDA and has been authorized for detection and/or diagnosis of SARS-CoV-2 by FDA under an Emergency Use Authorization (EUA). This EUA will remain in effect (meaning this test can be used) for the duration of the COVID-19 declaration under Section 564(b)(1) of the Act, 21 U.S.C. section 360bbb-3(b)(1), unless the authorization is terminated or revoked.  Performed at [facility], [address] [place], [place] [address]   Gastrointestinal Panel by PCR , Stool     Status: None   Collection Time: 12/12/21  8:26 AM   Specimen: Stool  Result Value Ref Range Status   Campylobacter species NOT DETECTED NOT DETECTED Final   Plesimonas shigelloides NOT DETECTED NOT DETECTED Final   Salmonella species NOT DETECTED NOT DETECTED Final   Yersinia enterocolitica NOT DETECTED NOT DETECTED Final   Vibrio species NOT DETECTED NOT DETECTED Final   Vibrio cholerae NOT DETECTED NOT DETECTED Final   Enteroaggregative E coli (EAEC) NOT DETECTED NOT DETECTED Final   Enteropathogenic E coli (EPEC) NOT DETECTED NOT DETECTED Final    Enterotoxigenic E coli (ETEC) NOT DETECTED NOT DETECTED Final   Shiga like toxin producing E coli (STEC) NOT DETECTED NOT DETECTED Final   Shigella/Enteroinvasive E coli (EIEC) NOT DETECTED NOT DETECTED Final   Cryptosporidium NOT DETECTED NOT DETECTED Final   Cyclospora cayetanensis NOT DETECTED NOT DETECTED Final   Entamoeba histolytica NOT DETECTED NOT DETECTED Final   Giardia lamblia NOT DETECTED NOT DETECTED Final   Adenovirus F40/41 NOT DETECTED NOT DETECTED Final   Astrovirus NOT DETECTED NOT DETECTED Final   Norovirus GI/GII NOT DETECTED NOT DETECTED Final   Rotavirus A NOT DETECTED NOT DETECTED Final   Sapovirus (I, II, IV, and V) NOT DETECTED NOT DETECTED Final    Comment: Performed at [facility], [address] [place], [place] [address]  C Difficile Quick Screen w PCR reflex     Status: Abnormal   Collection Time: 12/12/21  8:26 AM   Specimen: STOOL  Result Value Ref Range Status   C Diff antigen POSITIVE (A) NEGATIVE Final   C Diff toxin NEGATIVE NEGATIVE Final   C Diff interpretation Results are indeterminate. See PCR results.  Final    Comment: Performed at [facility], [address] [place], [place] [address]  C. Diff by PCR, Reflexed     Status: None   Collection Time: 12/12/21  8:26 AM  Result Value Ref Range Status   Toxigenic C. Difficile by PCR NEGATIVE NEGATIVE Final    Comment: Patient is colonized with non toxigenic C. difficile. May not need treatment unless significant symptoms are present. Performed at [facility], [address] [place], [place] [address]   Blood Culture (routine x 2)     Status: None (Preliminary result)   Collection Time: 12/12/21 10:16 PM   Specimen: BLOOD LEFT FOREARM  Result Value Ref Range Status   Specimen Description BLOOD LEFT FOREARM  Final   Special Requests   Final    BOTTLES DRAWN AEROBIC AND ANAEROBIC Blood Culture adequate volume   Culture   Final    NO GROWTH 2 DAYS Performed at  [facility], [place], [place] [address]    Report Status PENDING  Incomplete  Blood Culture (routine x 2)     Status: None (Preliminary result)   Collection Time: 12/12/21 10:27 PM   Specimen: BLOOD  Result Value Ref Range Status   Specimen Description BLOOD LEFT WRIST  Final   Special Requests   Final    BOTTLES DRAWN AEROBIC AND ANAEROBIC Blood Culture adequate volume   Culture   Final    NO GROWTH 2 DAYS Performed at [facility], [address] [place],  [address]    Report Status PENDING  Incomplete         Radiology Studies: No results found.      Scheduled Meds:  (feeding supplement) PROSource Plus  30 mL Oral TID BM   vitamin C  500 mg Oral BID   aspirin EC  81 mg Oral Daily   atorvastatin  20 mg Oral Daily   Chlorhexidine Gluconate Cloth  6 each Topical Daily   feeding supplement (NEPRO CARB STEADY)  237 mL Oral TID BM   heparin  5,000 Units Subcutaneous Q8H   insulin aspart  0-5 Units Subcutaneous QHS   insulin aspart  0-9 Units Subcutaneous TID WC   leptospermum manuka honey  1 Application Topical Daily   levothyroxine  125 mcg Oral Q0600   lipase/protease/amylase  12,000 Units Oral TID WC   losartan  50 mg Oral Daily   multivitamin  1 tablet Oral QHS   polyethylene glycol  17 g Oral BID   pregabalin  75 mg Oral BID   senna-docusate  2 tablet Oral QHS   sevelamer carbonate  800 mg Oral TID WC   valACYclovir  500 mg Oral Daily   zinc sulfate  220 mg Oral Daily   Continuous Infusions:  ceFEPime (MAXIPIME) IV 1 g (12/15/21 1028)   metronidazole 500 mg (12/15/21 0740)     LOS: 3 days     Sidney Ace, MD [facility]   If 7PM-7AM, please contact night-coverage  12/15/2021, 1:21 PM

## 2021-12-15 NOTE — Progress Notes (Addendum)
[facility]  ROUNDING NOTE   Subjective:   Tonya Myers is a 56 year old female with past medical history including depression, type 2 diabetes, asthma, hypertension, hypothyroidism, CLL, and end-stage renal disease on hemodialysis.   Patient seen sitting up in bed, alert and oriented Husband at bedside Patient states she tolerated dialysis well yesterday. She says she usually feels sick and nauseated.    Objective:  Vital signs in last 24 hours:  Temp:  [98.2 F (36.8 C)-99.6 F (37.6 C)] 98.2 F (36.8 C) (08/22 0806) Pulse Rate:  [48-65] 65 (08/22 0806) Resp:  [12-17] 17 (08/21 2324) BP: (137-173)/(85-93) 137/86 (08/22 0806) SpO2:  [99 %-100 %] 100 % (08/22 0806) Weight:  [62.3 kg-62.9 kg] 62.3 kg (08/22 0500)  Weight change: -10 kg Filed Weights   12/14/21 0904 12/14/21 1252 12/15/21 0500  Weight: 63.7 kg 62.9 kg 62.3 kg     Intake/Output: I/O last 3 completed shifts: In: 6834 [P.O.:720; IV Piggyback:450] Out: 1000 [Other:1000]   Intake/Output this shift:  No intake/output data recorded.  Physical Exam: General: NAD  Head: Normocephalic, atraumatic. Moist oral mucosal membranes  Eyes: Anicteric  Lungs:  Clear to auscultation, normal effort  Heart: Regular rate and rhythm  Abdomen:  Soft, nontender  Extremities:  No peripheral edema.  Neurologic: Nonfocal, moving all four extremities  Skin: No lesions  Access: Rt Permcath    Basic Metabolic Panel: Recent Labs  Lab 12/09/21 2329 12/10/21 0527 12/10/21 1044 12/11/21 0614 12/12/21 0546 12/13/21 0630 12/14/21 0637 12/15/21 0618  NA  --    < >  --  139 139 141 141 139  K  --    < >  --  3.9 4.1 3.9 4.2 4.4  CL  --    < >  --  106 102 106 106 104  CO2  --    < >  --  '24 26 25 23 26  '$ GLUCOSE  --    < >  --  80 82 78 100* 78  BUN  --    < >  --  39* 20 32* 47* 30*  CREATININE  --    < >  --  5.69* 3.31* 4.77* 5.96* 3.53*  CALCIUM  --    < >  --  9.0 9.2 9.0 9.1 8.9  MG 1.9  --  2.1  --    --   --   --   --    < > = values in this interval not displayed.     Liver Function Tests: Recent Labs  Lab 12/12/21 0546  AST 34  ALT 25  ALKPHOS 69  BILITOT 0.7  PROT 6.1*  ALBUMIN 3.3*    Recent Labs  Lab 12/12/21 0546 12/13/21 0630  LIPASE 96* 77*    No results for input(s): "AMMONIA" in the last 168 hours.  CBC: Recent Labs  Lab 12/09/21 1548 12/10/21 0527 12/11/21 0614 12/12/21 0546 12/13/21 0630 12/14/21 0637 12/15/21 0618  WBC 4.4   < > 3.5* 4.2 3.2* 3.7* 3.3*  NEUTROABS 1.3*  --   --  1.7  --  1.6* 1.3*  HGB 8.7*   < > 7.9* 9.1* 8.0* 8.0* 7.8*  HCT 28.0*   < > 24.8* 29.0* 25.8* 25.2* 24.0*  MCV 94.6   < > 95.4 95.1 95.9 95.8 94.9  PLT 184   < > 182 206 169 172 PLATELET CLUMPS NOTED ON SMEAR, UNABLE TO ESTIMATE   < > = values in this  interval not displayed.     Cardiac Enzymes: No results for input(s): "CKTOTAL", "CKMB", "CKMBINDEX", "TROPONINI" in the last 168 hours.  BNP: Invalid input(s): "POCBNP"  CBG: Recent Labs  Lab 12/14/21 1653 12/14/21 2105 12/15/21 0737 12/15/21 0808 12/15/21 1152  GLUCAP 151* 73 83 86 134*     Microbiology: Results for orders placed or performed during the hospital encounter of 12/12/21  Resp Panel by RT-PCR (Flu A&B, Covid) Anterior Nasal Swab     Status: None   Collection Time: 12/12/21  4:28 AM   Specimen: Anterior Nasal Swab  Result Value Ref Range Status   SARS Coronavirus 2 by RT PCR NEGATIVE NEGATIVE Final    Comment: (NOTE) SARS-CoV-2 target nucleic acids are NOT DETECTED.  The SARS-CoV-2 RNA is generally detectable in upper respiratory specimens during the acute phase of infection. The lowest concentration of SARS-CoV-2 viral copies this assay can detect is 138 copies/mL. A negative result does not preclude SARS-Cov-2 infection and should not be used as the sole basis for treatment or other patient management decisions. A negative result may occur with  improper specimen  collection/handling, submission of specimen other than nasopharyngeal swab, presence of viral mutation(s) within the areas targeted by this assay, and inadequate number of viral copies(<138 copies/mL). A negative result must be combined with clinical observations, patient history, and epidemiological information. The expected result is Negative.  Fact Sheet for Patients:  EntrepreneurPulse.com.au  Fact Sheet for Healthcare Providers:  IncredibleEmployment.be  This test is no t yet approved or cleared by the [place] FDA and  has been authorized for detection and/or diagnosis of SARS-CoV-2 by FDA under an Emergency Use Authorization (EUA). This EUA will remain  in effect (meaning this test can be used) for the duration of the COVID-19 declaration under Section 564(b)(1) of the Act, 21 U.S.C.section 360bbb-3(b)(1), unless the authorization is terminated  or revoked sooner.       Influenza A by PCR NEGATIVE NEGATIVE Final   Influenza B by PCR NEGATIVE NEGATIVE Final    Comment: (NOTE) The Xpert Xpress SARS-CoV-2/FLU/RSV plus assay is intended as an aid in the diagnosis of influenza from Nasopharyngeal swab specimens and should not be used as a sole basis for treatment. Nasal washings and aspirates are unacceptable for Xpert Xpress SARS-CoV-2/FLU/RSV testing.  Fact Sheet for Patients: EntrepreneurPulse.com.au  Fact Sheet for Healthcare Providers: IncredibleEmployment.be  This test is not yet approved or cleared by the [place] FDA and has been authorized for detection and/or diagnosis of SARS-CoV-2 by FDA under an Emergency Use Authorization (EUA). This EUA will remain in effect (meaning this test can be used) for the duration of the COVID-19 declaration under Section 564(b)(1) of the Act, 21 U.S.C. section 360bbb-3(b)(1), unless the authorization is terminated or revoked.  Performed at [facility], [address] [place], [place] [address]   Gastrointestinal Panel by PCR , Stool     Status: None   Collection Time: 12/12/21  8:26 AM   Specimen: Stool  Result Value Ref Range Status   Campylobacter species NOT DETECTED NOT DETECTED Final   Plesimonas shigelloides NOT DETECTED NOT DETECTED Final   Salmonella species NOT DETECTED NOT DETECTED Final   Yersinia enterocolitica NOT DETECTED NOT DETECTED Final   Vibrio species NOT DETECTED NOT DETECTED Final   Vibrio cholerae NOT DETECTED NOT DETECTED Final   Enteroaggregative E coli (EAEC) NOT DETECTED NOT DETECTED Final   Enteropathogenic E coli (EPEC) NOT DETECTED NOT DETECTED Final   Enterotoxigenic E  coli (ETEC) NOT DETECTED NOT DETECTED Final   Shiga like toxin producing E coli (STEC) NOT DETECTED NOT DETECTED Final   Shigella/Enteroinvasive E coli (EIEC) NOT DETECTED NOT DETECTED Final   Cryptosporidium NOT DETECTED NOT DETECTED Final   Cyclospora cayetanensis NOT DETECTED NOT DETECTED Final   Entamoeba histolytica NOT DETECTED NOT DETECTED Final   Giardia lamblia NOT DETECTED NOT DETECTED Final   Adenovirus F40/41 NOT DETECTED NOT DETECTED Final   Astrovirus NOT DETECTED NOT DETECTED Final   Norovirus GI/GII NOT DETECTED NOT DETECTED Final   Rotavirus A NOT DETECTED NOT DETECTED Final   Sapovirus (I, II, IV, and V) NOT DETECTED NOT DETECTED Final    Comment: Performed at [facility], [address] [place], [place] [address]  C Difficile Quick Screen w PCR reflex     Status: Abnormal   Collection Time: 12/12/21  8:26 AM   Specimen: STOOL  Result Value Ref Range Status   C Diff antigen POSITIVE (A) NEGATIVE Final   C Diff toxin NEGATIVE NEGATIVE Final   C Diff interpretation Results are indeterminate. See PCR results.  Final    Comment: Performed at [facility], [address] [place], [place] [address]  C. Diff by PCR, Reflexed     Status: None   Collection Time: 12/12/21  8:26 AM   Result Value Ref Range Status   Toxigenic C. Difficile by PCR NEGATIVE NEGATIVE Final    Comment: Patient is colonized with non toxigenic C. difficile. May not need treatment unless significant symptoms are present. Performed at [facility], [address] [place], [place] [address]   Blood Culture (routine x 2)     Status: None (Preliminary result)   Collection Time: 12/12/21 10:16 PM   Specimen: BLOOD LEFT FOREARM  Result Value Ref Range Status   Specimen Description BLOOD LEFT FOREARM  Final   Special Requests   Final    BOTTLES DRAWN AEROBIC AND ANAEROBIC Blood Culture adequate volume   Culture   Final    NO GROWTH 2 DAYS Performed at [facility], [address] [place], [place] [address]    Report Status PENDING  Incomplete  Blood Culture (routine x 2)     Status: None (Preliminary result)   Collection Time: 12/12/21 10:27 PM   Specimen: BLOOD  Result Value Ref Range Status   Specimen Description BLOOD LEFT WRIST  Final   Special Requests   Final    BOTTLES DRAWN AEROBIC AND ANAEROBIC Blood Culture adequate volume   Culture   Final    NO GROWTH 2 DAYS Performed at [facility], [address], [place],  [address]    Report Status PENDING  Incomplete    Coagulation Studies: No results for input(s): "LABPROT", "INR" in the last 72 hours.   Urinalysis: No results for input(s): "COLORURINE", "LABSPEC", "PHURINE", "GLUCOSEU", "HGBUR", "BILIRUBINUR", "KETONESUR", "PROTEINUR", "UROBILINOGEN", "NITRITE", "LEUKOCYTESUR" in the last 72 hours.  Invalid input(s): "APPERANCEUR"     Imaging: No results found.   Medications:    ceFEPime (MAXIPIME) IV 1 g (12/15/21 1028)   metronidazole 500 mg (12/15/21 0740)     (feeding supplement) PROSource Plus  30 mL Oral TID BM   vitamin C  500 mg Oral BID   aspirin EC  81 mg Oral Daily   atorvastatin  20 mg Oral Daily   Chlorhexidine Gluconate Cloth  6 each Topical Daily   feeding  supplement (NEPRO CARB STEADY)  237 mL Oral TID  BM   heparin  5,000 Units Subcutaneous Q8H   insulin aspart  0-5 Units Subcutaneous QHS   insulin aspart  0-9 Units Subcutaneous TID WC   leptospermum manuka honey  1 Application Topical Daily   levothyroxine  125 mcg Oral Q0600   lipase/protease/amylase  12,000 Units Oral TID WC   losartan  50 mg Oral Daily   multivitamin  1 tablet Oral QHS   polyethylene glycol  17 g Oral BID   pregabalin  75 mg Oral BID   senna-docusate  2 tablet Oral QHS   sevelamer carbonate  800 mg Oral TID WC   valACYclovir  500 mg Oral Daily   zinc sulfate  220 mg Oral Daily   acetaminophen, albuterol, dextromethorphan-guaiFENesin, diclofenac Sodium, fluticasone, hydrALAZINE, iohexol, ondansetron (ZOFRAN) IV, oxyCODONE-acetaminophen  Assessment/ Plan:  [name] is a 56 y.o.  female with past medical history including depression, type 2 diabetes, asthma, hypertension, hypothyroidism, CLL, and end-stage renal disease on hemodialysis.  [facility]/MWF/right PermCath/66.4kg  End-stage renal disease on hemodialysis.    -Received dialysis yesterday, UF 1L achieved. Next treatment scheduled for Wednesday. Will adjust dry weight prior to discharge.   2. Anemia of chronic kidney disease Lab Results  Component Value Date   HGB 7.8 (L) 12/15/2021  Patient receives [place] outpatient. Hgb below target. Will consider ESA if remains inpatient.   3. Secondary Hyperparathyroidism: with outpatient labs: PTH 235, phosphorus 6.7, calcium 9.0 on 11/30/2021.   Lab Results  Component Value Date   CALCIUM 8.9 12/15/2021   PHOS 3.2 11/28/2020    Will continue to monitor bone minerals during this admission.  4. 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 Home regimen includes lispro. Most recent hemoglobin A1c is 4.9 on 12/09/2021.   Primary team will manage Sliding Scale insulin.   5.  Syncopal episode during  dialysis treatment.  Cardiology consulted due to elevated troponins overnight.  Echo shows EF 55 to 60%.  Home cardiac monitor remains in place from previous admission.    6.  Nausea/sacral wound/retained stool/C. difficile positive. Management as per internal medicine team.  Currently has received aztreonam, cefepime, metronidazole and vancomycin IV -Received soap suds enema for fecal impaction. -General surgery consulted and will defer I&D.  - WOC following    LOS: 3 [place] 8/22/202312:36 PM

## 2021-12-16 DIAGNOSIS — S31000D Unspecified open wound of lower back and pelvis without penetration into retroperitoneum, subsequent encounter: Secondary | ICD-10-CM | POA: Diagnosis not present

## 2021-12-16 DIAGNOSIS — R55 Syncope and collapse: Secondary | ICD-10-CM | POA: Diagnosis not present

## 2021-12-16 DIAGNOSIS — N186 End stage renal disease: Secondary | ICD-10-CM | POA: Diagnosis not present

## 2021-12-16 DIAGNOSIS — K5289 Other specified noninfective gastroenteritis and colitis: Secondary | ICD-10-CM | POA: Diagnosis not present

## 2021-12-16 DIAGNOSIS — R531 Weakness: Secondary | ICD-10-CM | POA: Diagnosis not present

## 2021-12-16 DIAGNOSIS — R197 Diarrhea, unspecified: Secondary | ICD-10-CM | POA: Diagnosis not present

## 2021-12-16 LAB — CBC WITH DIFFERENTIAL/PLATELET
Abs Immature Granulocytes: 0.07 10*3/uL (ref 0.00–0.07)
Basophils Absolute: 0 10*3/uL (ref 0.0–0.1)
Basophils Relative: 1 %
Eosinophils Absolute: 0.1 10*3/uL (ref 0.0–0.5)
Eosinophils Relative: 3 %
HCT: 25.4 % — ABNORMAL LOW (ref 36.0–46.0)
Hemoglobin: 7.9 g/dL — ABNORMAL LOW (ref 12.0–15.0)
Immature Granulocytes: 2 %
Lymphocytes Relative: 46 %
Lymphs Abs: 1.6 10*3/uL (ref 0.7–4.0)
MCH: 29.4 pg (ref 26.0–34.0)
MCHC: 31.1 g/dL (ref 30.0–36.0)
MCV: 94.4 fL (ref 80.0–100.0)
Monocytes Absolute: 0.4 10*3/uL (ref 0.1–1.0)
Monocytes Relative: 10 %
Neutro Abs: 1.3 10*3/uL — ABNORMAL LOW (ref 1.7–7.7)
Neutrophils Relative %: 38 %
Platelets: 176 10*3/uL (ref 150–400)
RBC: 2.69 MIL/uL — ABNORMAL LOW (ref 3.87–5.11)
RDW: 18.4 % — ABNORMAL HIGH (ref 11.5–15.5)
WBC: 3.5 10*3/uL — ABNORMAL LOW (ref 4.0–10.5)
nRBC: 0 % (ref 0.0–0.2)

## 2021-12-16 LAB — BASIC METABOLIC PANEL
Anion gap: 13 (ref 5–15)
BUN: 48 mg/dL — ABNORMAL HIGH (ref 6–20)
CO2: 23 mmol/L (ref 22–32)
Calcium: 9.3 mg/dL (ref 8.9–10.3)
Chloride: 105 mmol/L (ref 98–111)
Creatinine, Ser: 4.86 mg/dL — ABNORMAL HIGH (ref 0.44–1.00)
GFR, Estimated: 10 mL/min — ABNORMAL LOW (ref >60)
Glucose, Bld: 79 mg/dL (ref 70–99)
Potassium: 5.1 mmol/L (ref 3.5–5.1)
Sodium: 141 mmol/L (ref 135–145)

## 2021-12-16 LAB — GLUCOSE, CAPILLARY
Glucose-Capillary: 79 mg/dL (ref 70–99)
Glucose-Capillary: 99 mg/dL (ref 70–99)
Glucose-Capillary: 123 mg/dL — ABNORMAL HIGH (ref 70–99)
Glucose-Capillary: 149 mg/dL — ABNORMAL HIGH (ref 70–99)

## 2021-12-16 MED ORDER — PREGABALIN 75 MG PO CAPS
75.0000 mg | ORAL_CAPSULE | Freq: Every day | ORAL | Status: DC
  Administered 2021-12-16: 75 mg via ORAL
  Filled 2021-12-16 (×2): qty 1

## 2021-12-16 MED ORDER — BOOST / RESOURCE BREEZE PO LIQD CUSTOM
1.0000 | Freq: Three times a day (TID) | ORAL | Status: DC
  Administered 2021-12-16 – 2021-12-24 (×15): 1 via ORAL

## 2021-12-16 MED ORDER — LOSARTAN POTASSIUM 50 MG PO TABS
50.0000 mg | ORAL_TABLET | Freq: Every day | ORAL | Status: DC
  Administered 2021-12-17 – 2021-12-23 (×7): 50 mg via ORAL
  Filled 2021-12-16 (×7): qty 1

## 2021-12-16 NOTE — Progress Notes (Signed)
Upon giving bedside shift report, patient stated that she wanted to go to dialysis after all. Called dialysis back and was given a message that I would be called back for report. I have already given bedside shift report to oncoming nurse. Will inform dialysis nurse via messaging.

## 2021-12-16 NOTE — Progress Notes (Signed)
PROGRESS NOTE    Tonya Myers  PFX:[id number] DOB: 1965/05/08 DOA: 12/12/2021 PCP: [name], MD    Brief Narrative:  56 y.o. 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    Pt was recently hospitalized from 8/16 - 8/18 due to syncope.  Per report, dialysis staff performed 2 minutes of CPR prior to arrival to [facility]. Pt had elevated trop up to  452.  Cardiology was consulted, suspected that her syncopal episode was vasovagal/hypotensive in nature, and troponin elevation was demand ischemia in that setting. 2 D echo on 8/17 showed LVEF of 55-60% without WMA's or other valvular pathologies. Pt also has had UTI and was started on Cipro at the discharge.    Patient states that after she went home, she has worsening weakness.  She has fever and chills.  She continues to have symptoms of UTI, including dysuria, burning on urination and urinary frequency.  She also reports nausea, diarrhea and mild lower abdominal pain.  No vomiting.  Patient has had 5 times of diarrhea.   C. difficile assay negative.  Antigen positive toxin negative.  Diarrhea has resolved.  No clinical indications of acute C. difficile infection.  Sacral decubitus ulcer has been evaluated by surgery.  No surgical intervention warranted.  Nephrology following for inpatient hemodialysis needs.  8/23: Rapid response called last night due to decreased level of responsiveness and bilateral lower extremity weakness.  Code stroke called.  CT head negative.  Patient offered MRI but refused.  Stated she was simply tired.  When evaluated the following morning no focal  neurologic deficit noted however patient is noted to have slow responses and be somewhat tremulous.  Discussed with nephrology.  Possible effect from pregabalin.  Dose decreased to renal dose of 75 mg nightly.   Assessment & Plan:   Principal Problem:   Diarrhea Active Problems:   Stercoral colitis   Sacral wound   UTI (urinary tract infection)   End-stage renal disease on hemodialysis (HCC)   Essential hypertension   Hypothyroidism   Dyslipidemia   Myocardial injury   Type II diabetes mellitus with renal manifestations (HCC)   Anemia in ESRD (end-stage renal disease) (HCC)   Chronic pancreatitis (HCC)   Asthma   Closed fracture of sternum   Transient neurologic deficit 12/15/21   History of herpes genitalis  Decreased LOC  weakness Tremulousness Rapid response called 8/22 for decreased level responsiveness and weakness.  Also associated with decreased grip strength.  Unclear etiology.  CT head negative.  Code stroke called.  MRI was offered but patient declined.  Stating fatigue.  The following morning the patient is neurologically intact however is continuing to endorse weakness and has some tremulous this on exam.  Delayed response time. Plan: Low suspicion for acute CVA Defer MRI Dose of pregabalin decreased to 75 mg nightly Avoid nonessential sedatives Discontinue antibiotics, cefepime induced neurotoxicity also remains on differential   Stercoral colitis Diarrhea Patient presented with diarrhea in the setting of fecal impaction resulting in stercoral colitis.  Bowel regimen with disimpaction was effective and now patient is having bowel movements. Patient is now completed 5 days of cefepime and Flagyl Plan: DC antibiotics   Stage  IV sacral decubitus ulcer Seen and evaluated by general surgery.  No indication for surgical intervention at this time Wound care consult appreciated Plan: Discontinue all antibiotics Wound care Therapy as tolerated Need skilled  nursing facility  Possible urinary tract infection Patient was discharged on ciprofloxacin 1 day prior to presentation Still has typical symptoms of UTI Has completed empiric course of treatment  ESRD on HD Nephrology following for inpatient HD needs HD 8/23  Essential hypertension Poor control Continue PTA Cozaar Scheduled hydralazine PRN IV hydralazine  Hypothyroidism PTA Synthroid  Elevated troponins No significant delta Low suspicion for ACS  Type II diabetes mellitus with renal manifestations ([place]) Sliding scale Last A1c 4.9, well controlled  Anemia of chronic kidney disease Hemoglobin stable  Chronic pancreatitis Normal LFTs and lipase Creon started this admission  History of asthma Stable, unclear severity P.o. and bronchodilators  Closed fracture of sternum No indication for surgical intervention Multimodal pain control  History of herpes genitalis PTA suppressive valacyclovir    DVT prophylaxis: SQ heparin Code Status: Full Family Communication: [name] [phone] on 8/22 Disposition Plan: Status is: Inpatient Remains inpatient appropriate because: Sacral wound on IV antibiotics.  Need skilled nursing facility.   Level of care: Progressive  Consultants:  Nephrology  Procedures:  None  Antimicrobials:  Subjective: Patient seen and examined.  Reports fatigue.  Delayed verbal responses.  No pain complaints  Objective: Vitals:   12/15/21 2304 12/16/21 0120 12/16/21 0533 12/16/21 0752  BP: (!) 154/73 128/64 (!) 148/81 (!) 155/89  Pulse: (!) 59 (!) 53 (!) 54 (!) 58  Resp: '18 16 16 19  '$ Temp: (!) 97.5 F (36.4 C)  98.3 F (36.8 C) 98.3 F (36.8 C)  TempSrc:      SpO2: 100% 100% 100% 100%  Weight:        Intake/Output Summary (Last 24 hours) at 12/16/2021 1300 Last data filed at 12/16/2021 1100 Gross per 24 hour  Intake 917.3 ml  Output --  Net 917.3 ml   Filed Weights   12/14/21 0904 12/14/21 1252 12/15/21 0500   Weight: 63.7 kg 62.9 kg 62.3 kg    Examination:  General exam: No distress.  Delayed response time Respiratory system: Lungs clear.  Normal work of breathing.  Room air Cardiovascular system: S2, RRR, no murmurs, no pedal edema Gastrointestinal system: Soft, NT/ND, normal bowel sounds Central nervous system: Alert and oriented.  Mild tremulousness no focal neurological deficits. Extremities: Decreased power bilateral lower extremities Skin: Stage IV sacral decubitus Psychiatry: Judgement and insight appear normal. Mood & affect appropriate.     Data Reviewed: I have personally reviewed following labs and imaging studies  CBC: Recent Labs  Lab 12/09/21 1548 12/10/21 0527 12/12/21 0546 12/13/21 0630 12/14/21 0637 12/15/21 0618 12/16/21 0854  WBC 4.4   < > 4.2 3.2* 3.7* 3.3* 3.5*  NEUTROABS 1.3*  --  1.7  --  1.6* 1.3* 1.3*  HGB 8.7*   < > 9.1* 8.0* 8.0* 7.8* 7.9*  HCT 28.0*   < > 29.0* 25.8* 25.2* 24.0* 25.4*  MCV 94.6   < > 95.1 95.9 95.8 94.9 94.4  PLT 184   < > 206 169 172 PLATELET CLUMPS NOTED ON SMEAR, UNABLE TO ESTIMATE 176   < > = values in this interval not displayed.   Basic Metabolic Panel: Recent Labs  Lab 12/09/21 2329 12/10/21 0527 12/10/21 1044 12/11/21 6237 12/12/21 0546 12/13/21 0630 12/14/21 6283 12/15/21 0618 12/16/21 0854  NA  --    < >  --    < >  139 141 141 139 141  K  --    < >  --    < > 4.1 3.9 4.2 4.4 5.1  CL  --    < >  --    < > 102 106 106 104 105  CO2  --    < >  --    < > '26 25 23 26 23  '$ GLUCOSE  --    < >  --    < > 82 78 100* 78 79  BUN  --    < >  --    < > 20 32* 47* 30* 48*  CREATININE  --    < >  --    < > 3.31* 4.77* 5.96* 3.53* 4.86*  CALCIUM  --    < >  --    < > 9.2 9.0 9.1 8.9 9.3  MG 1.9  --  2.1  --   --   --   --   --   --    < > = values in this interval not displayed.   GFR: Estimated Creatinine Clearance: 12.4 mL/min (A) (by C-G formula based on SCr of 4.86 mg/dL (H)). Liver Function Tests: Recent Labs  Lab  12/12/21 0546  AST 34  ALT 25  ALKPHOS 69  BILITOT 0.7  PROT 6.1*  ALBUMIN 3.3*   Recent Labs  Lab 12/12/21 0546 12/13/21 0630  LIPASE 96* 77*   No results for input(s): "AMMONIA" in the last 168 hours. Coagulation Profile: Recent Labs  Lab 12/10/21 0130 12/12/21 0546  INR 1.1 1.0   Cardiac Enzymes: No results for input(s): "CKTOTAL", "CKMB", "CKMBINDEX", "TROPONINI" in the last 168 hours. BNP (last 3 results) No results for input(s): "PROBNP" in the last 8760 hours. HbA1C: No results for input(s): "HGBA1C" in the last 72 hours. CBG: Recent Labs  Lab 12/15/21 1152 12/15/21 1710 12/15/21 1956 12/16/21 0731 12/16/21 1208  GLUCAP 134* 117* 133* 79 99   Lipid Profile: No results for input(s): "CHOL", "HDL", "LDLCALC", "TRIG", "CHOLHDL", "LDLDIRECT" in the last 72 hours. Thyroid Function Tests: No results for input(s): "TSH", "T4TOTAL", "FREET4", "T3FREE", "THYROIDAB" in the last 72 hours. Anemia Panel: No results for input(s): "VITAMINB12", "FOLATE", "FERRITIN", "TIBC", "IRON", "RETICCTPCT" in the last 72 hours. Sepsis Labs: Recent Labs  Lab 12/12/21 0546  PROCALCITON 0.82  LATICACIDVEN 1.4    Recent Results (from the past 240 hour(s))  Urine Culture     Status: Abnormal   Collection Time: 12/10/21  9:00 PM   Specimen: Urine, Clean Catch  Result Value Ref Range Status   Specimen Description   Final    URINE, CLEAN CATCH Performed at [facility], [address], [place], [place] [address]    Special Requests   Final    NONE Performed at [facility], [address] [place], [place] [address]    Culture (A)  Final    <10,000 COLONIES/mL INSIGNIFICANT GROWTH Performed at [facility] [address] [place], [place] [address]    Report Status 12/12/2021 FINAL  Final  Resp Panel by RT-PCR (Flu A&B, Covid) Anterior Nasal Swab     Status: None   Collection Time: 12/12/21  4:28 AM   Specimen: Anterior Nasal Swab  Result Value  Ref Range Status   SARS Coronavirus 2 by RT PCR NEGATIVE NEGATIVE Final    Comment: (NOTE) SARS-CoV-2 target nucleic acids are NOT DETECTED.  The SARS-CoV-2 RNA is generally detectable in upper  respiratory specimens during the acute phase of infection. The lowest concentration of SARS-CoV-2 viral copies this assay can detect is 138 copies/mL. A negative result does not preclude SARS-Cov-2 infection and should not be used as the sole basis for treatment or other patient management decisions. A negative result may occur with  improper specimen collection/handling, submission of specimen other than nasopharyngeal swab, presence of viral mutation(s) within the areas targeted by this assay, and inadequate number of viral copies(<138 copies/mL). A negative result must be combined with clinical observations, patient history, and epidemiological information. The expected result is Negative.  Fact Sheet for Patients:  EntrepreneurPulse.com.au  Fact Sheet for Healthcare Providers:  IncredibleEmployment.be  This test is no t yet approved or cleared by the [place] FDA and  has been authorized for detection and/or diagnosis of SARS-CoV-2 by FDA under an Emergency Use Authorization (EUA). This EUA will remain  in effect (meaning this test can be used) for the duration of the COVID-19 declaration under Section 564(b)(1) of the Act, 21 U.S.C.section 360bbb-3(b)(1), unless the authorization is terminated  or revoked sooner.       Influenza A by PCR NEGATIVE NEGATIVE Final   Influenza B by PCR NEGATIVE NEGATIVE Final    Comment: (NOTE) The Xpert Xpress SARS-CoV-2/FLU/RSV plus assay is intended as an aid in the diagnosis of influenza from Nasopharyngeal swab specimens and should not be used as a sole basis for treatment. Nasal washings and aspirates are unacceptable for Xpert Xpress SARS-CoV-2/FLU/RSV testing.  Fact Sheet for  Patients: EntrepreneurPulse.com.au  Fact Sheet for Healthcare Providers: IncredibleEmployment.be  This test is not yet approved or cleared by the [place] FDA and has been authorized for detection and/or diagnosis of SARS-CoV-2 by FDA under an Emergency Use Authorization (EUA). This EUA will remain in effect (meaning this test can be used) for the duration of the COVID-19 declaration under Section 564(b)(1) of the Act, 21 U.S.C. section 360bbb-3(b)(1), unless the authorization is terminated or revoked.  Performed at [facility], [address] [place], [place] [address]   Gastrointestinal Panel by PCR , Stool     Status: None   Collection Time: 12/12/21  8:26 AM   Specimen: Stool  Result Value Ref Range Status   Campylobacter species NOT DETECTED NOT DETECTED Final   Plesimonas shigelloides NOT DETECTED NOT DETECTED Final   Salmonella species NOT DETECTED NOT DETECTED Final   Yersinia enterocolitica NOT DETECTED NOT DETECTED Final   Vibrio species NOT DETECTED NOT DETECTED Final   Vibrio cholerae NOT DETECTED NOT DETECTED Final   Enteroaggregative E coli (EAEC) NOT DETECTED NOT DETECTED Final   Enteropathogenic E coli (EPEC) NOT DETECTED NOT DETECTED Final   Enterotoxigenic E coli (ETEC) NOT DETECTED NOT DETECTED Final   Shiga like toxin producing E coli (STEC) NOT DETECTED NOT DETECTED Final   Shigella/Enteroinvasive E coli (EIEC) NOT DETECTED NOT DETECTED Final   Cryptosporidium NOT DETECTED NOT DETECTED Final   Cyclospora cayetanensis NOT DETECTED NOT DETECTED Final   Entamoeba histolytica NOT DETECTED NOT DETECTED Final   Giardia lamblia NOT DETECTED NOT DETECTED Final   Adenovirus F40/41 NOT DETECTED NOT DETECTED Final   Astrovirus NOT DETECTED NOT DETECTED Final   Norovirus GI/GII NOT DETECTED NOT DETECTED Final   Rotavirus A NOT DETECTED NOT DETECTED Final   Sapovirus (I, II, IV, and V) NOT DETECTED NOT DETECTED Final     Comment: Performed at [facility], [address], [place], [place] [address]  C Difficile Quick Screen  w PCR reflex     Status: Abnormal   Collection Time: 12/12/21  8:26 AM   Specimen: STOOL  Result Value Ref Range Status   C Diff antigen POSITIVE (A) NEGATIVE Final   C Diff toxin NEGATIVE NEGATIVE Final   C Diff interpretation Results are indeterminate. See PCR results.  Final    Comment: Performed at [facility], [address] [place], [place] [address]  C. Diff by PCR, Reflexed     Status: None   Collection Time: 12/12/21  8:26 AM  Result Value Ref Range Status   Toxigenic C. Difficile by PCR NEGATIVE NEGATIVE Final    Comment: Patient is colonized with non toxigenic C. difficile. May not need treatment unless significant symptoms are present. Performed at [facility], [address] [place], [place] [address]   Blood Culture (routine x 2)     Status: None (Preliminary result)   Collection Time: 12/12/21 10:16 PM   Specimen: BLOOD LEFT FOREARM  Result Value Ref Range Status   Specimen Description BLOOD LEFT FOREARM  Final   Special Requests   Final    BOTTLES DRAWN AEROBIC AND ANAEROBIC Blood Culture adequate volume   Culture   Final    NO GROWTH 4 DAYS Performed at [facility], [address], [place], [place] [address]    Report Status PENDING  Incomplete  Blood Culture (routine x 2)     Status: None (Preliminary result)   Collection Time: 12/12/21 10:27 PM   Specimen: BLOOD  Result Value Ref Range Status   Specimen Description BLOOD LEFT WRIST  Final   Special Requests   Final    BOTTLES DRAWN AEROBIC AND ANAEROBIC Blood Culture adequate volume   Culture   Final    NO GROWTH 4 DAYS Performed at [facility], [address], [place],  [address]    Report Status PENDING  Incomplete         Radiology Studies: CT HEAD CODE STROKE WO CONTRAST`  Result Date: 12/15/2021 CLINICAL DATA:  Code stroke.   Weakness facial paralysis EXAM: CT HEAD WITHOUT CONTRAST TECHNIQUE: Contiguous axial images were obtained from the base of the skull through the vertex without intravenous contrast. RADIATION DOSE REDUCTION: This exam was performed according to the departmental dose-optimization program which includes automated exposure control, adjustment of the mA and/or kV according to patient size and/or use of iterative reconstruction technique. COMPARISON:  None Available. FINDINGS: Brain: There is no mass, hemorrhage or extra-axial collection. The size and configuration of the ventricles and extra-axial CSF spaces are normal. There is hypoattenuation of the periventricular white matter, most commonly indicating chronic ischemic microangiopathy. Vascular: No abnormal hyperdensity of the major intracranial arteries or dural venous sinuses. No intracranial atherosclerosis. Skull: The visualized skull base, calvarium and extracranial soft tissues are normal. Sinuses/Orbits: No fluid levels or advanced mucosal thickening of the visualized paranasal sinuses. No mastoid or middle ear effusion. The orbits are normal. ASPECTS [facility] Stroke Program Early CT Score) - Ganglionic level infarction (caudate, lentiform nuclei, internal capsule, insula, M1-M3 cortex): 7 - Supraganglionic infarction (M4-M6 cortex): 3 Total score (0-10 with 10 being normal): 10 IMPRESSION: 1. No acute hemorrhage or mass lesion. 2. ASPECTS is 10. These results were called by telephone at the time of interpretation on 12/15/2021 at 9:02 pm to Dr. Damita Dunnings, Who verbally acknowledged these results. Electronically Signed   By: Ulyses Jarred M.D.   On: 12/15/2021 21:06        Scheduled  Meds:  (feeding supplement) PROSource Plus  30 mL Oral TID BM   vitamin C  500 mg Oral BID   aspirin EC  81 mg Oral Daily   atorvastatin  20 mg Oral Daily   Chlorhexidine Gluconate Cloth  6 each Topical Daily   feeding supplement  1 Container Oral TID BM   heparin  5,000  Units Subcutaneous Q8H   insulin aspart  0-5 Units Subcutaneous QHS   insulin aspart  0-9 Units Subcutaneous TID WC   leptospermum manuka honey  1 Application Topical Daily   levothyroxine  125 mcg Oral Q0600   lipase/protease/amylase  12,000 Units Oral TID WC   [START ON 12/17/2021] losartan  50 mg Oral Daily   multivitamin  1 tablet Oral QHS   polyethylene glycol  17 g Oral BID   pregabalin  75 mg Oral Daily   senna-docusate  2 tablet Oral QHS   sevelamer carbonate  800 mg Oral TID WC   valACYclovir  500 mg Oral Daily   zinc sulfate  220 mg Oral Daily   Continuous Infusions:     LOS: 4 days     Sidney Ace, MD [facility]   If 7PM-7AM, please contact night-coverage  12/16/2021, 1:00 PM

## 2021-12-16 NOTE — Progress Notes (Signed)
Patient's mental status has improved since transfer to floor. Was lethargic and asleep on transfer so I could not perform an adequate mental assessment. This morning, patient was more alert and talking. Continues to refuse MRI and also does not want to go to dialysis. NIHSS score is 10. Patient has no control in BLE. Patient called her cousin Abigail Butts on the phone and I informed her that patient had moved from 1A to room 257 due to mental status change. Denied pain.

## 2021-12-16 NOTE — TOC CM/SW Note (Signed)
Went by room to discuss SNF recommendation. Patient off unit. Will try again later.  Dayton Scrape, [place]

## 2021-12-16 NOTE — Progress Notes (Signed)
At [place] the charge nurse was walking by the patients room and the patient stated she needed to be monitored closely, charge nurse got me and said she was slurring her words and asked if that was her normal baseline. I went to the patients room and she looked at me then gazed off staring at the wall and couldn't respond. I kept asking her questions and noticed she was slurring her words and couldn't get out what she was trying to say, which was not her normal baseline after having her as my patient for the past 2 nights.Patients last known well time was 7:15pm.   I called a rapid response at [place] and the rapid team arrived at [place]. Upon arrival of the rapid response nurse, the patient had left side face drooping when asked to smile and drooling. She could not grip with her left hand when asked too. Pt said that she felt like she was biting her tongue on the left side. Vital signs were stable, CBG was 133 mg/dL.  Code stroke was called at 8:07pm and Dr. Damita Dunnings was notified and came to the bedside.  Dr. Damita Dunnings assessed the patient and after assessment told us to go ahead with the code stroke. A STAT head CT was ordered and performed. A STAT teleneuro consult was ordered and patient was assessed by the tele-neurologist and was found to have general weakness in all extremities. I stated my concerns with the rapid RN and the tele-neurologist about her delayed speech that was persisting and left sided arm and hand weakness because the left arm was what she primarily used; At baseline she already had bi-lateral leg weakness and Right arm and hand weakness. Per teleneuro, no other interventions were needed at the time.   After teleneuro consult the patients delayed speech became slower and slower and was no longer able to follow commands. I paged Dr. Damita Dunnings again and she came to the bedside and did another assessment on the patient. An MRI was ordered and the patient stated "she did not want anymore scans or test ran  and that she was just so tired".  Dr. Damita Dunnings put in an order to transfer her, up to progressive care for closer monitoring. (See orders)  Hand off was given to Jana Half, Therapist, sports.

## 2021-12-16 NOTE — Procedures (Signed)
Patient Name: Tonya Myers  MRN: [id number]  Epilepsy Attending: Lora Havens  Referring Physician/Provider: Athena Masse, MD  Date: 12/16/2021 Duration: 25.02 mins  Patient history: 56 year old female with syncope.  EEG to evaluate for seizure.  Level of alertness: Awake, asleep  AEDs during EEG study: None  Technical aspects: This EEG study was done with scalp electrodes positioned according to the 10-20 International system of electrode placement. Electrical activity was reviewed with band pass filter of 1-'70Hz'$ , sensitivity of 7 uV/mm, display speed of 24m/sec with a '60Hz'$  notched filter applied as appropriate. EEG data were recorded continuously and digitally stored.  Video monitoring was available and reviewed as appropriate.  Description: The posterior dominant rhythm consists of 8 Hz activity of moderate voltage (25-35 uV) seen predominantly in posterior head regions, symmetric and reactive to eye opening and eye closing. Sleep was characterized by vertex waves, sleep spindles (12 to 14 Hz), maximal frontocentral region. Physiologic photic driving was not seen during photic stimulation.  Hyperventilation was not performed.     IMPRESSION: This study is within normal limits. No seizures or epileptiform discharges were seen throughout the recording.  A normal interictal EEG does not exclude nor support the diagnosis of epilepsy.   Alquan Morrish OBarbra Sarks

## 2021-12-16 NOTE — Progress Notes (Signed)
Eeg done 

## 2021-12-16 NOTE — Progress Notes (Signed)
OT Cancellation Note  Patient Details Name: Kelliann Pendergraph MRN: [id number] DOB: 1966-04-11   Cancelled Treatment:    Reason Eval/Treat Not Completed: Patient at procedure or test/ unavailable. Chart reviewed, pt off unit for HD at this time, will re-attempt as available.   Dessie Coma, M.S. OTR/L  12/16/21, 3:11 PM  ascom [phone]

## 2021-12-16 NOTE — Progress Notes (Signed)
Report from Jana Half, [place]. Patient is refusing HD. Candiss Norse, MD notified.

## 2021-12-16 NOTE — Progress Notes (Addendum)
Nutrition Follow-up  DOCUMENTATION CODES:   Not applicable  INTERVENTION:   -Continue Renal MVI daily -Continue 30 ml Prosource Plus TID, each supplement provides 100 kcals and 15 grams protein -D/c Nepro Shake po TID, each supplement provides 425 kcal and 19 grams protein  -Boost Breeze po TID, each supplement provides 250 kcal and 9 grams of protein  -500 mg vitamin C BID -220 mg zinc sulfate daily x 14 days -Double protein portions at meals  NUTRITION DIAGNOSIS:   Increased nutrient needs related to wound healing as evidenced by estimated needs.  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  Reviewed I/O's: +917 ml x 24 hours and +2.2 L since admission   Spoke with pt at bedside, who reports that she is feeling a little better today. She complains of tingling in her mouth and stuttering; noted neurology work-up pending. Pt was transferred to SDU for closer monitoring secondary mental status changes.   Per pt, she reports she has a fair appetite. She is concerned regarding her weight loss and muscle mass. Per pt, she does not remember diet recall from home as she reports she is so frequently in the hospital. Pt with good oral intake. Noted meal completions 50-100%.   Reviewed wt hx; pt has experienced a 32.5% wt loss over the past 3 months, which is  significant for time frame.   Discussed importance of good meal and supplement intake to promote healing. Pt reports she prefers Ensure Clear supplements and is amenable to [employer]. Discussed importance of good protein intake to preserve lean body mass and assist with wound healing.   Medications reviewed and include vitamin C, creon, miralax, senokot, renvela, and zinc sulfate.   Labs reviewed: CBGS: 79-99 (inpatient orders for glycemic control are 0-5 units insulin aspart daily at bedtime and 0-9 units insulin aspart TID with meals).    NUTRITION - FOCUSED PHYSICAL EXAM:  Flowsheet Row Most Recent Value  Orbital Region Mild depletion  Upper Arm Region Moderate depletion  Thoracic and Lumbar Region Mild depletion  Buccal Region Mild depletion  Temple Region Moderate depletion  Clavicle Bone Region Moderate depletion  Clavicle and Acromion Bone Region Moderate depletion  Scapular Bone Region Moderate depletion  Dorsal Hand Mild depletion  Patellar Region Moderate depletion  Anterior Thigh Region Moderate depletion  Posterior Calf Region Moderate depletion  Edema (RD Assessment) None  Hair Reviewed  Eyes Reviewed  Mouth Reviewed  Skin Reviewed  Nails Reviewed       Diet Order:   Diet Order             Diet Carb Modified Fluid consistency: Thin; Room service appropriate? Yes  Diet effective now                   EDUCATION NEEDS:   No education needs have been identified at this time  Skin:  Skin Assessment: Skin Integrity Issues: Skin  Integrity Issues:: Stage III Stage II: - Stage III: sacrum (healing per CWOCN note on 12/11/21)  Last BM:  12/14/21 (type 3)  Height:   Ht Readings from Last 1 Encounters:  12/09/21 6' (1.829 m)    Weight:   Wt Readings from Last 1 Encounters:  12/15/21 62.3 kg    Ideal Body Weight:  78.2 kg  BMI:  Body mass index is 18.63 kg/m.  Estimated Nutritional Needs:   Kcal:  2200-2400  Protein:  125-150  grams  Fluid:  1000 ml + UOP    Loistine Chance, RD, LDN, [place] Registered Dietitian II Certified Diabetes Care and Education Specialist Please refer to [facility] for RD and/or RD on-call/weekend/after hours pager

## 2021-12-16 NOTE — Progress Notes (Signed)
[facility]  ROUNDING NOTE   Subjective:   Tonya Myers is a 56 year old female with past medical history including depression, type 2 diabetes, asthma, hypertension, hypothyroidism, CLL, and end-stage renal disease on hemodialysis.   Patient seen and evaluated at bedside. No family at bedside Chart review-code stroke called overnight concerns of altered mental status and slurred speech Alert and oriented, delayed responses Equal strength bilaterally   Objective:  Vital signs in last 24 hours:  Temp:  [97.5 F (36.4 C)-98.4 F (36.9 C)] 98.3 F (36.8 C) (08/23 0752) Pulse Rate:  [53-78] 58 (08/23 0752) Resp:  [16-19] 19 (08/23 0752) BP: (125-162)/(64-89) 155/89 (08/23 0752) SpO2:  [97 %-100 %] 100 % (08/23 0752)  Weight change:  Filed Weights   12/14/21 0904 12/14/21 1252 12/15/21 0500  Weight: 63.7 kg 62.9 kg 62.3 kg     Intake/Output: I/O last 3 completed shifts: In: 150 [IV Piggyback:150] Out: -    Intake/Output this shift:  Total I/O In: 917.3 [P.O.:120; IV Piggyback:797.3] Out: -   Physical Exam: General: NAD  Head: Normocephalic, atraumatic. Moist oral mucosal membranes  Eyes: Anicteric  Lungs:  Clear to auscultation, normal effort  Heart: Regular rate and rhythm  Abdomen:  Soft, nontender  Extremities:  No peripheral edema.  Neurologic: Alert, moving upper extremities  Skin: No lesions  Access: Rt Permcath    Basic Metabolic Panel: Recent Labs  Lab 12/09/21 2329 12/10/21 0527 12/10/21 1044 12/11/21 0614 12/12/21 0546 12/13/21 0630 12/14/21 0637 12/15/21 0618 12/16/21 0854  NA  --    < >  --    < > 139 141 141 139 141  K  --    < >  --    < > 4.1 3.9 4.2 4.4 5.1  CL  --    < >  --    < > 102 106 106 104 105  CO2  --    < >  --    < > '26 25 23 26 23  '$ GLUCOSE  --    < >  --    < > 82 78 100* 78 79  BUN  --    < >  --    < > 20 32* 47* 30* 48*  CREATININE  --    < >  --    < > 3.31* 4.77* 5.96* 3.53* 4.86*  CALCIUM  --    <  >  --    < > 9.2 9.0 9.1 8.9 9.3  MG 1.9  --  2.1  --   --   --   --   --   --    < > = values in this interval not displayed.     Liver Function Tests: Recent Labs  Lab 12/12/21 0546  AST 34  ALT 25  ALKPHOS 69  BILITOT 0.7  PROT 6.1*  ALBUMIN 3.3*    Recent Labs  Lab 12/12/21 0546 12/13/21 0630  LIPASE 96* 77*    No results for input(s): "AMMONIA" in the last 168 hours.  CBC: Recent Labs  Lab 12/09/21 1548 12/10/21 0527 12/12/21 0546 12/13/21 0630 12/14/21 0637 12/15/21 0618 12/16/21 0854  WBC 4.4   < > 4.2 3.2* 3.7* 3.3* 3.5*  NEUTROABS 1.3*  --  1.7  --  1.6* 1.3* 1.3*  HGB 8.7*   < > 9.1* 8.0* 8.0* 7.8* 7.9*  HCT 28.0*   < > 29.0* 25.8* 25.2* 24.0* 25.4*  MCV 94.6   < >  95.1 95.9 95.8 94.9 94.4  PLT 184   < > 206 169 172 PLATELET CLUMPS NOTED ON SMEAR, UNABLE TO ESTIMATE 176   < > = values in this interval not displayed.     Cardiac Enzymes: No results for input(s): "CKTOTAL", "CKMB", "CKMBINDEX", "TROPONINI" in the last 168 hours.  BNP: Invalid input(s): "POCBNP"  CBG: Recent Labs  Lab 12/15/21 0808 12/15/21 1152 12/15/21 1710 12/15/21 1956 12/16/21 0731  GLUCAP 86 134* 117* 133* 29     Microbiology: Results for orders placed or performed during the hospital encounter of 12/12/21  Resp Panel by RT-PCR (Flu A&B, Covid) Anterior Nasal Swab     Status: None   Collection Time: 12/12/21  4:28 AM   Specimen: Anterior Nasal Swab  Result Value Ref Range Status   SARS Coronavirus 2 by RT PCR NEGATIVE NEGATIVE Final    Comment: (NOTE) SARS-CoV-2 target nucleic acids are NOT DETECTED.  The SARS-CoV-2 RNA is generally detectable in upper respiratory specimens during the acute phase of infection. The lowest concentration of SARS-CoV-2 viral copies this assay can detect is 138 copies/mL. A negative result does not preclude SARS-Cov-2 infection and should not be used as the sole basis for treatment or other patient management decisions. A  negative result may occur with  improper specimen collection/handling, submission of specimen other than nasopharyngeal swab, presence of viral mutation(s) within the areas targeted by this assay, and inadequate number of viral copies(<138 copies/mL). A negative result must be combined with clinical observations, patient history, and epidemiological information. The expected result is Negative.  Fact Sheet for Patients:  EntrepreneurPulse.com.au  Fact Sheet for Healthcare Providers:  IncredibleEmployment.be  This test is no t yet approved or cleared by the [place] FDA and  has been authorized for detection and/or diagnosis of SARS-CoV-2 by FDA under an Emergency Use Authorization (EUA). This EUA will remain  in effect (meaning this test can be used) for the duration of the COVID-19 declaration under Section 564(b)(1) of the Act, 21 U.S.C.section 360bbb-3(b)(1), unless the authorization is terminated  or revoked sooner.       Influenza A by PCR NEGATIVE NEGATIVE Final   Influenza B by PCR NEGATIVE NEGATIVE Final    Comment: (NOTE) The Xpert Xpress SARS-CoV-2/FLU/RSV plus assay is intended as an aid in the diagnosis of influenza from Nasopharyngeal swab specimens and should not be used as a sole basis for treatment. Nasal washings and aspirates are unacceptable for Xpert Xpress SARS-CoV-2/FLU/RSV testing.  Fact Sheet for Patients: EntrepreneurPulse.com.au  Fact Sheet for Healthcare Providers: IncredibleEmployment.be  This test is not yet approved or cleared by the [place] FDA and has been authorized for detection and/or diagnosis of SARS-CoV-2 by FDA under an Emergency Use Authorization (EUA). This EUA will remain in effect (meaning this test can be used) for the duration of the COVID-19 declaration under Section 564(b)(1) of the Act, 21 U.S.C. section 360bbb-3(b)(1), unless the authorization  is terminated or revoked.  Performed at [facility], [address] [place], [place] [address]   Gastrointestinal Panel by PCR , Stool     Status: None   Collection Time: 12/12/21  8:26 AM   Specimen: Stool  Result Value Ref Range Status   Campylobacter species NOT DETECTED NOT DETECTED Final   Plesimonas shigelloides NOT DETECTED NOT DETECTED Final   Salmonella species NOT DETECTED NOT DETECTED Final   Yersinia enterocolitica NOT DETECTED NOT DETECTED Final   Vibrio species NOT DETECTED NOT DETECTED Final   Vibrio  cholerae NOT DETECTED NOT DETECTED Final   Enteroaggregative E coli (EAEC) NOT DETECTED NOT DETECTED Final   Enteropathogenic E coli (EPEC) NOT DETECTED NOT DETECTED Final   Enterotoxigenic E coli (ETEC) NOT DETECTED NOT DETECTED Final   Shiga like toxin producing E coli (STEC) NOT DETECTED NOT DETECTED Final   Shigella/Enteroinvasive E coli (EIEC) NOT DETECTED NOT DETECTED Final   Cryptosporidium NOT DETECTED NOT DETECTED Final   Cyclospora cayetanensis NOT DETECTED NOT DETECTED Final   Entamoeba histolytica NOT DETECTED NOT DETECTED Final   Giardia lamblia NOT DETECTED NOT DETECTED Final   Adenovirus F40/41 NOT DETECTED NOT DETECTED Final   Astrovirus NOT DETECTED NOT DETECTED Final   Norovirus GI/GII NOT DETECTED NOT DETECTED Final   Rotavirus A NOT DETECTED NOT DETECTED Final   Sapovirus (I, II, IV, and V) NOT DETECTED NOT DETECTED Final    Comment: Performed at [facility], [address] [place], [place] [address]  C Difficile Quick Screen w PCR reflex     Status: Abnormal   Collection Time: 12/12/21  8:26 AM   Specimen: STOOL  Result Value Ref Range Status   C Diff antigen POSITIVE (A) NEGATIVE Final   C Diff toxin NEGATIVE NEGATIVE Final   C Diff interpretation Results are indeterminate. See PCR results.  Final    Comment: Performed at [facility], [address] [place], [place] [address]  C. Diff by PCR, Reflexed      Status: None   Collection Time: 12/12/21  8:26 AM  Result Value Ref Range Status   Toxigenic C. Difficile by PCR NEGATIVE NEGATIVE Final    Comment: Patient is colonized with non toxigenic C. difficile. May not need treatment unless significant symptoms are present. Performed at [facility], [address] [place], [place] [address]   Blood Culture (routine x 2)     Status: None (Preliminary result)   Collection Time: 12/12/21 10:16 PM   Specimen: BLOOD LEFT FOREARM  Result Value Ref Range Status   Specimen Description BLOOD LEFT FOREARM  Final   Special Requests   Final    BOTTLES DRAWN AEROBIC AND ANAEROBIC Blood Culture adequate volume   Culture   Final    NO GROWTH 4 DAYS Performed at [facility], [address], [place], [place] [address]    Report Status PENDING  Incomplete  Blood Culture (routine x 2)     Status: None (Preliminary result)   Collection Time: 12/12/21 10:27 PM   Specimen: BLOOD  Result Value Ref Range Status   Specimen Description BLOOD LEFT WRIST  Final   Special Requests   Final    BOTTLES DRAWN AEROBIC AND ANAEROBIC Blood Culture adequate volume   Culture   Final    NO GROWTH 4 DAYS Performed at [facility], [address], [place], [place] [address]    Report Status PENDING  Incomplete    Coagulation Studies: No results for input(s): "LABPROT", "INR" in the last 72 hours.   Urinalysis: No results for input(s): "COLORURINE", "LABSPEC", "PHURINE", "GLUCOSEU", "HGBUR", "BILIRUBINUR", "KETONESUR", "PROTEINUR", "UROBILINOGEN", "NITRITE", "LEUKOCYTESUR" in the last 72 hours.  Invalid input(s): "APPERANCEUR"     Imaging: CT HEAD CODE STROKE WO CONTRAST`  Result Date: 12/15/2021 CLINICAL DATA:  Code stroke.  Weakness facial paralysis EXAM: CT HEAD WITHOUT CONTRAST TECHNIQUE: Contiguous axial images were obtained from the base of the skull through the vertex without intravenous contrast. RADIATION DOSE REDUCTION: This  exam was performed according to the departmental  dose-optimization program which includes automated exposure control, adjustment of the mA and/or kV according to patient size and/or use of iterative reconstruction technique. COMPARISON:  None Available. FINDINGS: Brain: There is no mass, hemorrhage or extra-axial collection. The size and configuration of the ventricles and extra-axial CSF spaces are normal. There is hypoattenuation of the periventricular white matter, most commonly indicating chronic ischemic microangiopathy. Vascular: No abnormal hyperdensity of the major intracranial arteries or dural venous sinuses. No intracranial atherosclerosis. Skull: The visualized skull base, calvarium and extracranial soft tissues are normal. Sinuses/Orbits: No fluid levels or advanced mucosal thickening of the visualized paranasal sinuses. No mastoid or middle ear effusion. The orbits are normal. ASPECTS [facility] Stroke Program Early CT Score) - Ganglionic level infarction (caudate, lentiform nuclei, internal capsule, insula, M1-M3 cortex): 7 - Supraganglionic infarction (M4-M6 cortex): 3 Total score (0-10 with 10 being normal): 10 IMPRESSION: 1. No acute hemorrhage or mass lesion. 2. ASPECTS is 10. These results were called by telephone at the time of interpretation on 12/15/2021 at 9:02 pm to Dr. Damita Dunnings, Who verbally acknowledged these results. Electronically Signed   By: Ulyses Jarred M.D.   On: 12/15/2021 21:06     Medications:       (feeding supplement) PROSource Plus  30 mL Oral TID BM   vitamin C  500 mg Oral BID   aspirin EC  81 mg Oral Daily   atorvastatin  20 mg Oral Daily   Chlorhexidine Gluconate Cloth  6 each Topical Daily   feeding supplement (NEPRO CARB STEADY)  237 mL Oral TID BM   heparin  5,000 Units Subcutaneous Q8H   insulin aspart  0-5 Units Subcutaneous QHS   insulin aspart  0-9 Units Subcutaneous TID WC   leptospermum manuka honey  1 Application Topical Daily   levothyroxine  125  mcg Oral Q0600   lipase/protease/amylase  12,000 Units Oral TID WC   losartan  50 mg Oral Daily   multivitamin  1 tablet Oral QHS   polyethylene glycol  17 g Oral BID   pregabalin  75 mg Oral Daily   senna-docusate  2 tablet Oral QHS   sevelamer carbonate  800 mg Oral TID WC   valACYclovir  500 mg Oral Daily   zinc sulfate  220 mg Oral Daily   acetaminophen, albuterol, dextromethorphan-guaiFENesin, diclofenac Sodium, fluticasone, hydrALAZINE, iohexol, ondansetron (ZOFRAN) IV, oxyCODONE-acetaminophen  Assessment/ Plan:  Tonya Myers is a 56 y.o.  female with past medical history including depression, type 2 diabetes, asthma, hypertension, hypothyroidism, CLL, and end-stage renal disease on hemodialysis.  [facility]/MWF/right PermCath/66.4kg  End-stage renal disease on hemodialysis.    -Dialysis scheduled for later today, UF goal 1 L as tolerated.  Next treatment scheduled for Friday.  We will transition losartan to evening/night dosing to prevent interference with dialysis.  We will adjust pregabalin to renal dosing, pregabalin 75 mg nightly.  2. Anemia of chronic kidney disease Lab Results  Component Value Date   HGB 7.9 (L) 12/16/2021  Patient receives [place] outpatient.  Will order EPO 10,000 units IV with dialysis treatments.  3. Secondary Hyperparathyroidism: with outpatient labs: PTH 235, phosphorus 6.7, calcium 9.0 on 11/30/2021.   Lab Results  Component Value Date   CALCIUM 9.3 12/16/2021   PHOS 3.2 11/28/2020    Calcium and phosphorus within acceptable range.  4. 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 Home regimen includes lispro. Most recent hemoglobin A1c is 4.9 on 12/09/2021.   Glucose stable.  Primary team will manage Sliding Scale insulin.   5.  Syncopal episode during dialysis treatment.  Cardiology consulted due to elevated troponins overnight.  Echo shows EF 55 to 60%.  Home cardiac monitor  remains in place from previous admission.    6.  Nausea/sacral wound/retained stool/C. difficile positive. Management as per internal medicine team.  Currently has received aztreonam, cefepime, metronidazole and vancomycin IV -Received soap suds enema for fecal impaction. -General surgery consulted and will defer I&D.  - WOC following    LOS: 4   8/23/202312:04 PM

## 2021-12-16 NOTE — Care Management Important Message (Signed)
Important Message  Patient Details  Name: Tonya Myers MRN: [id number] Date of Birth: 01-30-1966   Medicare Important Message Given:  Yes     Dannette Barbara 12/16/2021, 12:14 PM

## 2021-12-16 NOTE — Progress Notes (Signed)
PT Cancellation Note  Patient Details Name: Tonya Myers MRN: [id number] DOB: 1965-11-05   Cancelled Treatment:    Reason Eval/Treat Not Completed: Patient at procedure or test/unavailable Pt out of room for dialysis this afternoon, not available for PT.  Will maintain on caseload and continue to see as appropriate.  Kreg Shropshire, DPT 12/16/2021, 4:00 PM

## 2021-12-16 NOTE — Progress Notes (Signed)
PT tolerated  HD for 3.5 hrs with no issues or distress.  UF = 1000 ml  Bp 150/89 Temp 98 HR 61 Rr 19  CVC heplocked and capped Report given to floor R.N.

## 2021-12-17 DIAGNOSIS — N186 End stage renal disease: Secondary | ICD-10-CM | POA: Diagnosis not present

## 2021-12-17 DIAGNOSIS — R55 Syncope and collapse: Secondary | ICD-10-CM | POA: Diagnosis not present

## 2021-12-17 DIAGNOSIS — D631 Anemia in chronic kidney disease: Secondary | ICD-10-CM | POA: Diagnosis not present

## 2021-12-17 DIAGNOSIS — R197 Diarrhea, unspecified: Secondary | ICD-10-CM | POA: Diagnosis not present

## 2021-12-17 DIAGNOSIS — R531 Weakness: Secondary | ICD-10-CM | POA: Diagnosis not present

## 2021-12-17 DIAGNOSIS — E440 Moderate protein-calorie malnutrition: Secondary | ICD-10-CM

## 2021-12-17 LAB — CULTURE, BLOOD (ROUTINE X 2)
Culture: NO GROWTH
Culture: NO GROWTH
Special Requests: ADEQUATE
Special Requests: ADEQUATE

## 2021-12-17 LAB — GLUCOSE, CAPILLARY
Glucose-Capillary: 86 mg/dL (ref 70–99)
Glucose-Capillary: 95 mg/dL (ref 70–99)
Glucose-Capillary: 102 mg/dL — ABNORMAL HIGH (ref 70–99)
Glucose-Capillary: 188 mg/dL — ABNORMAL HIGH (ref 70–99)

## 2021-12-17 MED ORDER — GABAPENTIN 100 MG PO CAPS
100.0000 mg | ORAL_CAPSULE | ORAL | Status: DC
  Administered 2021-12-18 – 2021-12-23 (×3): 100 mg via ORAL
  Filled 2021-12-17 (×3): qty 1

## 2021-12-17 MED ORDER — LACOSAMIDE 50 MG PO TABS
100.0000 mg | ORAL_TABLET | Freq: Two times a day (BID) | ORAL | Status: DC
  Administered 2021-12-17 – 2021-12-24 (×14): 100 mg via ORAL
  Filled 2021-12-17 (×14): qty 2

## 2021-12-17 MED ORDER — LACOSAMIDE 50 MG PO TABS
50.0000 mg | ORAL_TABLET | ORAL | Status: DC
  Administered 2021-12-18 – 2021-12-23 (×2): 50 mg via ORAL
  Filled 2021-12-17: qty 1

## 2021-12-17 NOTE — Progress Notes (Signed)
Physical Therapy Treatment Patient Details Name: Tonya Myers: [id number] DOB: 07/02/1965 Today's Date: 12/17/2021   History of Present Illness 56 y.o. 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    PT Comments    Pt continues to be very motivated but very weak and functionally limited.  She showed ability to engage quads but remains weak with against resistance/concentric contractions.  She showed great effort but again is very weak and atrophied globablly t/o LEs (worse distally).  Spent time discussing HEP, course of rehab and generally answering her questions. She hopes to try slide board transfer next session, however will require Hoyer lift with nursing for non-rehab mobility OOB.     Recommendations for follow up therapy are one component of a multi-disciplinary discharge planning process, led by the attending physician.  Recommendations may be updated based on patient status, additional functional criteria and insurance authorization.  Follow Up Recommendations  Skilled nursing-short term rehab (<3 hours/day) Can patient physically be transported by private vehicle: No   Assistance Recommended at Discharge Frequent or constant Supervision/Assistance  Patient can return home with the following Two people to help with walking and/or transfers;Two people to help with bathing/dressing/bathroom;Assistance with cooking/housework;Assist for transportation   Equipment Recommendations   (TBD)    Recommendations for Other Services       Precautions / Restrictions Precautions Precautions:  Fall Restrictions Weight Bearing Restrictions: No     Mobility  Bed Mobility     Rolling: Min assist         General bed mobility comments: rolling/positioning in bed    Transfers                        Ambulation/Gait                   Stairs             Wheelchair Mobility    Modified Rankin (Stroke Patients Only)       Balance                                            Cognition Arousal/Alertness: Awake/alert Behavior During Therapy: WFL for tasks assessed/performed Overall Cognitive Status: Within Functional Limits for tasks assessed                                          Exercises General Exercises - Lower Extremity Ankle Circles/Pumps: PROM, AAROM (PROM on L) Quad Sets: Strengthening, 10 reps Long Arc Quad: AROM, AAROM, 10 reps Heel Slides: AROM, 10 reps (with resisted leg ext) Hip ABduction/ADduction: Strengthening, 10 reps Straight Leg Raises: AAROM, 5 reps    General Comments General comments (skin integrity, edema, etc.): spent a good amount of time answering pt questions about course of rehab and progressive mobility and educating on HEP      Pertinent Vitals/Pain Pain Assessment Pain Assessment:  (sacral wound pain)    Home Living  Prior Function            PT Goals (current goals can now be found in the care plan section) Progress towards PT goals: Progressing toward goals    Frequency    Min 2X/week      PT Plan Current plan remains appropriate    Co-evaluation              AM-PAC PT "6 Clicks" Mobility   Outcome Measure  Help needed turning from your back to your side while in a flat bed without using bedrails?: A Little Help needed moving from lying on your back to sitting on the side of a flat bed without using bedrails?: A Little Help needed moving to and from a bed to a chair (including a wheelchair)?:  Total Help needed standing up from a chair using your arms (e.g., wheelchair or bedside chair)?: Total Help needed to walk in hospital room?: Total Help needed climbing 3-5 steps with a railing? : Total 6 Click Score: 10    End of Session   Activity Tolerance: Patient tolerated treatment well Patient left: with bed alarm set;with call bell/phone within reach;with family/visitor present Nurse Communication: Mobility status PT Visit Diagnosis: Muscle weakness (generalized) (M62.81);Difficulty in walking, not elsewhere classified (R26.2)     Time: 6415-8309 PT Time Calculation (min) (ACUTE ONLY): 28 min  Charges:  $Therapeutic Exercise: 23-37 mins                     Kreg Shropshire, DPT 12/17/2021, 5:30 PM

## 2021-12-17 NOTE — NC FL2 (Signed)
[place] LEVEL OF CARE SCREENING TOOL     IDENTIFICATION  Patient Name: Tonya Myers Birthdate: [birth year] Sex: female Admission Date (Current Location): 12/12/2021  [facility] [place] Number:  Engineering geologist and Address:  [facility], [address] [place], [place] [address]      Provider Number: [id number]  Attending Physician Name and Address:  Sidney Ace, MD  Relative Name and Phone Number:       Current Level of Care: Hospital Recommended Level of Care: [place] Prior Approval Number:    Date Approved/Denied:   PASRR Number: [id number]  Discharge Plan: SNF    Current Diagnoses: Patient Active Problem List   Diagnosis Date Noted   Malnutrition of moderate degree 12/17/2021   History of herpes genitalis 12/14/2021   Sepsis ([place]) 12/12/2021   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   Sacral wound 12/12/2021   Myocardial injury 12/12/2021   Closed fracture of sternum 12/12/2021   Chronic pancreatitis ([place]) 12/12/2021   Asthma 12/12/2021   Anemia in ESRD (end-stage renal disease) ([place]) 12/12/2021   Type II diabetes mellitus with renal manifestations ([place]) 12/12/2021   UTI (urinary tract infection) 12/12/2021   Diarrhea 12/12/2021   Pressure injury of skin 12/11/2021   Elevated troponin 12/10/2021   Syncope 12/09/2021   Dyslipidemia 12/09/2021   Type 2 diabetes mellitus with peripheral neuropathy ([place]) 12/09/2021   GERD without esophagitis 12/09/2021   End-stage renal disease on hemodialysis ([place]) 12/09/2021   Thrombocytopenia ([place]) 12/11/2020   FUO (fever of unknown origin) 12/11/2020   Pancytopenia ([place]) 12/11/2020   Oral thrush 12/11/2020   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[place]) 12/10/2020   Right arm weakness 10/21/2020   Quadriplegia ([place]) 10/10/2020   Wheelchair dependence 10/10/2020   Transient neurologic deficit 12/15/21 10/02/2020   Functional paraparesis 10/02/2020   Left upper quadrant  abdominal pain    Pain    Chronic pain of both knees    Hypokalemia    Urinary retention    Slow transit constipation    Meningitis    Labile blood glucose    Uncontrolled type 2 diabetes mellitus with hyperosmolar nonketotic hyperglycemia (HCC)    Transaminitis    Sleep disturbance    Anemia of chronic disease    Bacterial spinal epidural abscess 06/27/2020   Obesity (BMI 30.0-34.9)    Hypomagnesemia    Hypernatremia    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HCC)    Epidural abscess    Pneumococcal meningitis    Hyperglycemic crisis in diabetes mellitus ([place]) 28/36/6294   Acute metabolic encephalopathy 76/54/6503   Severe sepsis ([place])    Diabetic ketoacidosis without coma associated with type 2 diabetes mellitus ([place])    AKI (acute kidney injury) ([place])    Elevated LFTs    Acute encephalopathy 06/09/2020   Bacterial vaginosis 11/01/2016   Chronic right shoulder pain 03/23/2016   Numbness and tingling 03/23/2016   Uncontrolled type 2 diabetes mellitus with hyperglycemia, without long-term current use of insulin ([place]) 10/27/2015   DDD (degenerative disc disease), cervical 07/29/2015   Cervical disc disorder with radiculopathy of cervical region 07/29/2015   Cervical facet syndrome 07/29/2015   DDD (degenerative disc disease), lumbar 07/29/2015   Facet syndrome, lumbar 07/29/2015   Bilateral occipital neuralgia 07/29/2015   Sacroiliac joint dysfunction 07/29/2015   DJD of shoulder 07/29/2015   Herpes simplex vulvovaginitis 05/29/2015   Acute hemorrhoid 03/11/2015   Personal history of CLL (chronic lymphocytic leukemia) 01/15/2015   Hypothyroidism 12/26/2014   Arthritis 12/26/2014  Carpal tunnel syndrome, bilateral 12/26/2014   Controlled type 2 diabetes mellitus without complication, without long-term current use of insulin ([place]) 12/26/2014   Essential hypertension 12/26/2014   Hyperlipidemia, mixed 12/26/2014    Orientation RESPIRATION BLADDER Height & Weight     Self, Time, Situation,  Place  Normal Incontinent, External catheter Weight: 152 lb 1.9 oz (69 kg) Height:     BEHAVIORAL SYMPTOMS/MOOD NEUROLOGICAL BOWEL NUTRITION STATUS   (None)  (None) Incontinent Diet (Carb modified)  AMBULATORY STATUS COMMUNICATION OF NEEDS Skin   Extensive Assist Verbally Other (Comment), PU Stage and Appropriate Care (Erythema/redness.)   PU Stage 2 Dressing: Daily (Coccyx: Foam.)                   Personal Care Assistance Level of Assistance  Bathing, Feeding, Dressing Bathing Assistance: Maximum assistance Feeding assistance: Limited assistance Dressing Assistance: Maximum assistance     Functional Limitations Info  Sight, Hearing, Speech Sight Info: Adequate Hearing Info: Adequate Speech Info: Adequate    SPECIAL CARE FACTORS FREQUENCY  PT (By licensed PT), OT (By licensed OT)     PT Frequency: 5 x week OT Frequency: 5 x week            Contractures Contractures Info: Not present    Additional Factors Info  Code Status, Allergies, Isolation Precautions Code Status Info: Full code Allergies Info: Amoxapine, Penicillins, Ceftazidime     Isolation Precautions Info: Enteric precautions: MDR Pseudomonas, MDR Bacteria     Current Medications (12/17/2021):  This is the current hospital active medication list Current Facility-Administered Medications  Medication Dose Route Frequency Provider Last Rate Last Admin   (feeding supplement) PROSource Plus liquid 30 mL  30 mL Oral TID BM Swayze, Ava, DO   30 mL at 12/17/21 0946   acetaminophen (TYLENOL) tablet 650 mg  650 mg Oral Q6H PRN Ivor Costa, MD   650 mg at 12/16/21 2302   albuterol (PROVENTIL) (2.5 MG/3ML) 0.083% nebulizer solution 3 mL  3 mL Inhalation Q4H PRN Ivor Costa, MD       ascorbic acid (VITAMIN C) tablet 500 mg  500 mg Oral BID Swayze, Ava, DO   500 mg at 12/17/21 0945   aspirin EC tablet 81 mg  81 mg Oral Daily Ivor Costa, MD   81 mg at 12/17/21 0946   atorvastatin (LIPITOR) tablet 20 mg  20 mg Oral  Daily Ivor Costa, MD   20 mg at 12/17/21 0093   Chlorhexidine Gluconate Cloth 2 % PADS 6 each  6 each Topical Daily Ivor Costa, MD   6 each at 12/17/21 0946   dextromethorphan-guaiFENesin ([place] DM) 30-600 MG per 12 hr tablet 1 tablet  1 tablet Oral BID PRN Ivor Costa, MD       diclofenac Sodium (VOLTAREN) 1 % topical gel 2 g  2 g Topical QID PRN Ivor Costa, MD       feeding supplement (BOOST / RESOURCE BREEZE) liquid 1 Container  1 Container Oral TID BM Ralene Muskrat B, MD   1 Container at 12/17/21 0946   fluticasone (FLONASE) 50 MCG/ACT nasal spray 1 spray  1 spray Each Nare BID PRN Ivor Costa, MD       [START ON 12/18/2021] gabapentin (NEURONTIN) capsule 100 mg  100 mg Oral Q M,W,F-1800 Sreenath, Sudheer B, MD       heparin injection 5,000 Units  5,000 Units Subcutaneous Q8H Ivor Costa, MD   5,000 Units at 12/17/21 0626   hydrALAZINE (APRESOLINE) injection 5  mg  5 mg Intravenous Q2H PRN Ivor Costa, MD       insulin aspart (novoLOG) injection 0-5 Units  0-5 Units Subcutaneous QHS Ivor Costa, MD       insulin aspart (novoLOG) injection 0-9 Units  0-9 Units Subcutaneous TID WC Ivor Costa, MD   1 Units at 12/16/21 1827   iohexol (OMNIPAQUE) 300 MG/ML solution 100 mL  100 mL Intravenous Once PRN Ward, Kristen N, DO       leptospermum manuka honey ([place]) paste 1 Application  1 Application Topical Daily Sakai, Isami, DO   1 Application at 35/32/99 0947   levothyroxine (SYNTHROID) tablet 125 mcg  125 mcg Oral Q0600 Ivor Costa, MD   125 mcg at 12/17/21 2426   lipase/protease/amylase (CREON) capsule 12,000 Units  12,000 Units Oral TID WC Ivor Costa, MD   12,000 Units at 12/17/21 1222   losartan (COZAAR) tablet 50 mg  50 mg Oral Daily Colon Flattery, NP       multivitamin (RENA-VIT) tablet 1 tablet  1 tablet Oral QHS Swayze, Ava, DO   1 tablet at 12/14/21 2240   ondansetron (ZOFRAN) injection 4 mg  4 mg Intravenous Q8H PRN Ivor Costa, MD       oxyCODONE-acetaminophen (PERCOCET/ROXICET) 5-325  MG per tablet 1 tablet  1 tablet Oral Q4H PRN Ivor Costa, MD   1 tablet at 12/16/21 2256   polyethylene glycol (MIRALAX / GLYCOLAX) packet 17 g  17 g Oral BID Swayze, Ava, DO   17 g at 12/17/21 0945   senna-docusate (Senokot-S) tablet 2 tablet  2 tablet Oral QHS Swayze, Ava, DO       sevelamer carbonate (RENVELA) tablet 800 mg  800 mg Oral TID WC Ivor Costa, MD   800 mg at 12/17/21 1222   valACYclovir (VALTREX) tablet 500 mg  500 mg Oral Daily Swayze, Ava, DO   500 mg at 12/17/21 0945   zinc sulfate capsule 220 mg  220 mg Oral Daily Swayze, Ava, DO   220 mg at 12/17/21 8341     Discharge Medications: Please see discharge summary for a list of discharge medications.  Relevant Imaging Results:  Relevant Lab Results:   Additional Information SS#: [id number]. HD MWF [place] 11:55 am.  Candie Chroman, [name]

## 2021-12-17 NOTE — Progress Notes (Signed)
PROGRESS NOTE    Tonya Myers  MVH:[id number] DOB: 08/08/1965 DOA: 12/12/2021 PCP: [name], MD    Brief Narrative:  56 y.o. 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    Pt was recently hospitalized from 8/16 - 8/18 due to syncope.  Per report, dialysis staff performed 2 minutes of CPR prior to arrival to [facility]. Pt had elevated trop up to  452.  Cardiology was consulted, suspected that her syncopal episode was vasovagal/hypotensive in nature, and troponin elevation was demand ischemia in that setting. 2 D echo on 8/17 showed LVEF of 55-60% without WMA's or other valvular pathologies. Pt also has had UTI and was started on Cipro at the discharge.    Patient states that after she went home, she has worsening weakness.  She has fever and chills.  She continues to have symptoms of UTI, including dysuria, burning on urination and urinary frequency.  She also reports nausea, diarrhea and mild lower abdominal pain.  No vomiting.  Patient has had 5 times of diarrhea.   C. difficile assay negative.  Antigen positive toxin negative.  Diarrhea has resolved.  No clinical indications of acute C. difficile infection.  Sacral decubitus ulcer has been evaluated by surgery.  No surgical intervention warranted.  Nephrology following for inpatient hemodialysis needs.  8/23: Rapid response called last night due to decreased level of responsiveness and bilateral lower extremity weakness.  Code stroke called.  CT head negative.  Patient offered MRI but refused.  Stated she was simply tired.  When evaluated the following morning no focal  neurologic deficit noted however patient is noted to have slow responses and be somewhat tremulous.  Discussed with nephrology.  Possible effect from pregabalin.  Dose decreased to renal dose of 75 mg nightly.  Patient requested dc of lyrica and attempt at gabapentin   Assessment & Plan:   Principal Problem:   Diarrhea Active Problems:   Stercoral colitis   Sacral wound   UTI (urinary tract infection)   End-stage renal disease on hemodialysis ([place])   Essential hypertension   Hypothyroidism   Dyslipidemia   Myocardial injury   Type II diabetes mellitus with renal manifestations (H[facility])   Anemia in ESRD (end-stage renal disease) (HCC)   Chronic pancreatitis ([facility])   Asthma   Closed fracture of sternum   Transient neurologic deficit 12/15/21   History of herpes genitalis   Malnutrition of moderate degree  Decreased LOC  weakness Tremulousness Rapid response called 8/22 for decreased level responsiveness and weakness.  Also associated with decreased grip strength.  Unclear etiology.  CT head negative.  Code stroke called.  MRI was offered but patient declined.  Stating fatigue.  The following morning the patient is neurologically intact however is continuing to endorse weakness and has some tremulous this on exam.  Delayed response time. Suspect medication effect secondary to Lyrica Plan: Low suspicion for acute CVA Defer MRI Discontinue pregabalin Avoid nonessential sedatives Discontinue antibiotics, cefepime induced neurotoxicity also remains on differential   Stercoral colitis Diarrhea Patient presented with diarrhea in the setting of fecal impaction resulting in stercoral colitis.  Bowel regimen with disimpaction was effective and now patient is having bowel movements.  Patient is now completed 5 days of cefepime and Flagyl Plan: No further antibiotics   Stage IV sacral decubitus ulcer Seen and evaluated by general surgery.  No indication for surgical intervention at this  time Wound care consult appreciated Plan: Discontinue all antibiotics Wound care Therapy as tolerated Need skilled nursing facility  Possible urinary tract infection Patient was discharged on ciprofloxacin 1 day prior to presentation Still has typical symptoms of UTI Has completed empiric course of treatment  ESRD on HD Nephrology following for inpatient HD needs HD 8/23  Essential hypertension Poor control Continue PTA Cozaar Scheduled hydralazine PRN IV hydralazine  Hypothyroidism PTA Synthroid  Elevated troponins No significant delta Low suspicion for ACS  Type II diabetes mellitus with renal manifestations ([facility]) Sliding scale Last A1c 4.9, well controlled  Anemia of chronic kidney disease Hemoglobin stable  Chronic pancreatitis Normal LFTs and lipase Creon started this admission  History of asthma Stable, unclear severity P.o. and bronchodilators  Closed fracture of sternum No indication for surgical intervention Multimodal pain control  History of herpes genitalis PTA suppressive valacyclovir    DVT prophylaxis: SQ heparin Code Status: Full Family Communication: [name] [phone] on 8/22 Disposition Plan: Status is: Inpatient Remains inpatient appropriate because: Patient medically stable and can discharge to skilled nursing facility once we have an available bed   Level of care: Progressive  Consultants:  Nephrology  Procedures:  None  Antimicrobials:  Subjective: Patient seen and examined.  Endorses some weakness but improved from prior  Objective: Vitals:   12/16/21 2353 12/17/21 0350 12/17/21 0801 12/17/21 1210  BP: (!) 149/93 132/89 (!) 148/79 (!) 150/85  Pulse: 81 65 (!) 51 68  Resp: '16 16 18 18  '$ Temp: 98.8 F (37.1 C) 98.2 F (36.8 C) 98.7 F (37.1 C) 98.1 F (36.7 C)  TempSrc: Oral Oral  Oral  SpO2: 100% 100% 100% 100%  Weight:  69 kg      Intake/Output Summary (Last 24 hours) at 12/17/2021 1321 Last  data filed at 12/17/2021 0801 Gross per 24 hour  Intake --  Output 1000 ml  Net -1000 ml   Filed Weights   12/16/21 1354 12/16/21 1742 12/17/21 0350  Weight: 70.8 kg 69.9 kg 69 kg    Examination:  General exam: No distress.  Appears weak Respiratory system: Lungs clear.  Normal work of breathing.  Room air Cardiovascular system: S2, RRR, no murmurs, no pedal edema Gastrointestinal system: Soft, NT/ND, normal bowel sounds Central nervous system: Alert and oriented.  Mild tremulousness no focal neurological deficits. Extremities: Decreased power BLE Skin: Stage IV sacral decubitus Psychiatry: Judgement and insight appear normal. Mood & affect appropriate.     Data Reviewed: I have personally reviewed following labs and imaging studies  CBC: Recent Labs  Lab 12/12/21 0546 12/13/21 0630 12/14/21 0637 12/15/21 0618 12/16/21 0854  WBC 4.2 3.2* 3.7* 3.3* 3.5*  NEUTROABS 1.7  --  1.6* 1.3* 1.3*  HGB 9.1* 8.0* 8.0* 7.8* 7.9*  HCT 29.0* 25.8* 25.2* 24.0* 25.4*  MCV 95.1 95.9 95.8 94.9 94.4  PLT 206 169 172 PLATELET CLUMPS NOTED ON SMEAR, UNABLE TO ESTIMATE 211   Basic Metabolic Panel: Recent Labs  Lab 12/12/21 0546 12/13/21 0630 12/14/21 0637 12/15/21 0618 12/16/21 0854  NA 139 141 141 139 141  K 4.1 3.9 4.2 4.4 5.1  CL 102 106 106 104 105  CO2 '26 25 23 26 23  '$ GLUCOSE 82 78 100* 78 79  BUN 20 32* 47* 30* 48*  CREATININE 3.31* 4.77* 5.96* 3.53* 4.86*  CALCIUM 9.2 9.0 9.1 8.9 9.3   GFR: Estimated Creatinine Clearance: 13.7 mL/min (A) (by C-G formula based on SCr of 4.86 mg/dL (H)). Liver Function Tests: Recent Labs  Lab 12/12/21 0546  AST 34  ALT 25  ALKPHOS 69  BILITOT 0.7  PROT 6.1*  ALBUMIN 3.3*   Recent Labs  Lab 12/12/21 0546 12/13/21 0630  LIPASE 96* 77*   No results for input(s): "AMMONIA" in the last 168 hours. Coagulation Profile: Recent Labs  Lab 12/12/21 0546  INR 1.0   Cardiac Enzymes: No results for input(s): "CKTOTAL", "CKMB",  "CKMBINDEX", "TROPONINI" in the last 168 hours. BNP (last 3 results) No results for input(s): "PROBNP" in the last 8760 hours. HbA1C: No results for input(s): "HGBA1C" in the last 72 hours. CBG: Recent Labs  Lab 12/16/21 1208 12/16/21 1825 12/16/21 2041 12/17/21 0802 12/17/21 1144  GLUCAP 99 123* 149* 86 95   Lipid Profile: No results for input(s): "CHOL", "HDL", "LDLCALC", "TRIG", "CHOLHDL", "LDLDIRECT" in the last 72 hours. Thyroid Function Tests: No results for input(s): "TSH", "T4TOTAL", "FREET4", "T3FREE", "THYROIDAB" in the last 72 hours. Anemia Panel: No results for input(s): "VITAMINB12", "FOLATE", "FERRITIN", "TIBC", "IRON", "RETICCTPCT" in the last 72 hours. Sepsis Labs: Recent Labs  Lab 12/12/21 0546  PROCALCITON 0.82  LATICACIDVEN 1.4    Recent Results (from the past 240 hour(s))  Urine Culture     Status: Abnormal   Collection Time: 12/10/21  9:00 PM   Specimen: Urine, Clean Catch  Result Value Ref Range Status   Specimen Description   Final    URINE, CLEAN CATCH Performed at [facility], [address] [place], [place] [address]    Special Requests   Final    NONE Performed at [facility], [address], [place], [place] [address]    Culture (A)  Final    <10,000 COLONIES/mL INSIGNIFICANT GROWTH Performed at [facility] [address], [place], [place] [address]    Report Status 12/12/2021 FINAL  Final  Resp Panel by RT-PCR (Flu A&B, Covid) Anterior Nasal Swab     Status: None   Collection Time: 12/12/21  4:28 AM   Specimen: Anterior Nasal Swab  Result Value Ref Range Status   SARS Coronavirus 2 by RT PCR NEGATIVE NEGATIVE Final    Comment: (NOTE) SARS-CoV-2 target nucleic acids are NOT DETECTED.  The SARS-CoV-2 RNA is generally detectable in upper respiratory specimens during the acute phase of infection. The lowest concentration of SARS-CoV-2 viral copies this assay can detect is 138 copies/mL. A negative result  does not preclude SARS-Cov-2 infection and should not be used as the sole basis for treatment or other patient management decisions. A negative result may occur with  improper specimen collection/handling, submission of specimen other than nasopharyngeal swab, presence of viral mutation(s) within the areas targeted by this assay, and inadequate number of viral copies(<138 copies/mL). A negative result must be combined with clinical observations, patient history, and epidemiological information. The expected result is Negative.  Fact Sheet for Patients:  EntrepreneurPulse.com.au  Fact Sheet for Healthcare Providers:  IncredibleEmployment.be  This test is no t yet approved or cleared by the [place] FDA and  has been authorized for detection and/or diagnosis of SARS-CoV-2 by FDA under an Emergency Use Authorization (EUA). This EUA will remain  in effect (meaning this test can be used) for the duration of the COVID-19 declaration under Section 564(b)(1) of the Act, 21 U.S.C.section 360bbb-3(b)(1),  unless the authorization is terminated  or revoked sooner.       Influenza A by PCR NEGATIVE NEGATIVE Final   Influenza B by PCR NEGATIVE NEGATIVE Final    Comment: (NOTE) The Xpert Xpress SARS-CoV-2/FLU/RSV plus assay is intended as an aid in the diagnosis of influenza from Nasopharyngeal swab specimens and should not be used as a sole basis for treatment. Nasal washings and aspirates are unacceptable for Xpert Xpress SARS-CoV-2/FLU/RSV testing.  Fact Sheet for Patients: EntrepreneurPulse.com.au  Fact Sheet for Healthcare Providers: IncredibleEmployment.be  This test is not yet approved or cleared by the [place] FDA and has been authorized for detection and/or diagnosis of SARS-CoV-2 by FDA under an Emergency Use Authorization (EUA). This EUA will remain in effect (meaning this test can be used) for  the duration of the COVID-19 declaration under Section 564(b)(1) of the Act, 21 U.S.C. section 360bbb-3(b)(1), unless the authorization is terminated or revoked.  Performed at [facility], [address] [place], [place] [address]   Gastrointestinal Panel by PCR , Stool     Status: None   Collection Time: 12/12/21  8:26 AM   Specimen: Stool  Result Value Ref Range Status   Campylobacter species NOT DETECTED NOT DETECTED Final   Plesimonas shigelloides NOT DETECTED NOT DETECTED Final   Salmonella species NOT DETECTED NOT DETECTED Final   Yersinia enterocolitica NOT DETECTED NOT DETECTED Final   Vibrio species NOT DETECTED NOT DETECTED Final   Vibrio cholerae NOT DETECTED NOT DETECTED Final   Enteroaggregative E coli (EAEC) NOT DETECTED NOT DETECTED Final   Enteropathogenic E coli (EPEC) NOT DETECTED NOT DETECTED Final   Enterotoxigenic E coli (ETEC) NOT DETECTED NOT DETECTED Final   Shiga like toxin producing E coli (STEC) NOT DETECTED NOT DETECTED Final   Shigella/Enteroinvasive E coli (EIEC) NOT DETECTED NOT DETECTED Final   Cryptosporidium NOT DETECTED NOT DETECTED Final   Cyclospora cayetanensis NOT DETECTED NOT DETECTED Final   Entamoeba histolytica NOT DETECTED NOT DETECTED Final   Giardia lamblia NOT DETECTED NOT DETECTED Final   Adenovirus F40/41 NOT DETECTED NOT DETECTED Final   Astrovirus NOT DETECTED NOT DETECTED Final   Norovirus GI/GII NOT DETECTED NOT DETECTED Final   Rotavirus A NOT DETECTED NOT DETECTED Final   Sapovirus (I, II, IV, and V) NOT DETECTED NOT DETECTED Final    Comment: Performed at [facility] [address] [place], [place] [address]  C Difficile Quick Screen w PCR reflex     Status: Abnormal   Collection Time: 12/12/21  8:26 AM   Specimen: STOOL  Result Value Ref Range Status   C Diff antigen POSITIVE (A) NEGATIVE Final   C Diff toxin NEGATIVE NEGATIVE Final   C Diff interpretation Results are indeterminate. See PCR  results.  Final    Comment: Performed at [facility] [place], [place] [address]  C. Diff by PCR, Reflexed     Status: None   Collection Time: 12/12/21  8:26 AM  Result Value Ref Range Status   Toxigenic C. Difficile by PCR NEGATIVE NEGATIVE Final    Comment: Patient is colonized with non toxigenic C. difficile. May not need treatment unless significant symptoms are present. Performed at [facility], [address] [place], [place] [address]   Blood Culture (routine x 2)     Status: None   Collection Time: 12/12/21 10:16 PM   Specimen: BLOOD LEFT FOREARM  Result Value Ref Range Status   Specimen Description  BLOOD LEFT FOREARM  Final   Special Requests   Final    BOTTLES DRAWN AEROBIC AND ANAEROBIC Blood Culture adequate volume   Culture   Final    NO GROWTH 5 DAYS Performed at [facility], [address] [place], [place] [address]    Report Status 12/17/2021 FINAL  Final  Blood Culture (routine x 2)     Status: None   Collection Time: 12/12/21 10:27 PM   Specimen: BLOOD  Result Value Ref Range Status   Specimen Description BLOOD LEFT WRIST  Final   Special Requests   Final    BOTTLES DRAWN AEROBIC AND ANAEROBIC Blood Culture adequate volume   Culture   Final    NO GROWTH 5 DAYS Performed at [facility], [address] [place], [place] [address]    Report Status 12/17/2021 FINAL  Final         Radiology Studies: EEG adult  Result Date: 01-Jan-2022 Lora Havens, MD     01/01/2022  1:41 PM Patient Name: Nayeli Calvert MRN: [id number] Epilepsy Attending: Lora Havens Referring Physician/Provider: Athena Masse, MD Date: 2022/01/01 Duration: 25.02 mins Patient history: 56 year old female with syncope.  EEG to evaluate for seizure. Level of alertness: Awake, asleep AEDs during EEG study: None Technical aspects: This EEG study was done with scalp electrodes positioned according to the 10-20 International system  of electrode placement. Electrical activity was reviewed with band pass filter of 1-'70Hz'$ , sensitivity of 7 uV/mm, display speed of 11m/sec with a '60Hz'$  notched filter applied as appropriate. EEG data were recorded continuously and digitally stored.  Video monitoring was available and reviewed as appropriate. Description: The posterior dominant rhythm consists of 8 Hz activity of moderate voltage (25-35 uV) seen predominantly in posterior head regions, symmetric and reactive to eye opening and eye closing. Sleep was characterized by vertex waves, sleep spindles (12 to 14 Hz), maximal frontocentral region. Physiologic photic driving was not seen during photic stimulation.  Hyperventilation was not performed.   IMPRESSION: This study is within normal limits. No seizures or epileptiform discharges were seen throughout the recording. A normal interictal EEG does not exclude nor support the diagnosis of epilepsy. Priyanka OBarbra Sarks  CT HEAD CODE STROKE WO CONTRAST`  Result Date: 12/15/2021 CLINICAL DATA:  Code stroke.  Weakness facial paralysis EXAM: CT HEAD WITHOUT CONTRAST TECHNIQUE: Contiguous axial images were obtained from the base of the skull through the vertex without intravenous contrast. RADIATION DOSE REDUCTION: This exam was performed according to the departmental dose-optimization program which includes automated exposure control, adjustment of the mA and/or kV according to patient size and/or use of iterative reconstruction technique. COMPARISON:  None Available. FINDINGS: Brain: There is no mass, hemorrhage or extra-axial collection. The size and configuration of the ventricles and extra-axial CSF spaces are normal. There is hypoattenuation of the periventricular white matter, most commonly indicating chronic ischemic microangiopathy. Vascular: No abnormal hyperdensity of the major intracranial arteries or dural venous sinuses. No intracranial atherosclerosis. Skull: The visualized skull base, calvarium  and extracranial soft tissues are normal. Sinuses/Orbits: No fluid levels or advanced mucosal thickening of the visualized paranasal sinuses. No mastoid or middle ear effusion. The orbits are normal. ASPECTS ([facility] Early CT Score) - Ganglionic level infarction (caudate, lentiform nuclei, internal capsule, insula, M1-M3 cortex): 7 - Supraganglionic infarction (M4-M6 cortex): 3 Total score (0-10 with 10 being normal): 10 IMPRESSION: 1. No acute hemorrhage or mass lesion. 2. ASPECTS is 10. These results were called  by telephone at the time of interpretation on 12/15/2021 at 9:02 pm to Dr. Damita Dunnings, Who verbally acknowledged these results. Electronically Signed   By: Ulyses Jarred M.D.   On: 12/15/2021 21:06        Scheduled Meds:  (feeding supplement) PROSource Plus  30 mL Oral TID BM   vitamin C  500 mg Oral BID   aspirin EC  81 mg Oral Daily   atorvastatin  20 mg Oral Daily   Chlorhexidine Gluconate Cloth  6 each Topical Daily   feeding supplement  1 Container Oral TID BM   [START ON 12/18/2021] gabapentin  100 mg Oral Q M,W,F-1800   heparin  5,000 Units Subcutaneous Q8H   insulin aspart  0-5 Units Subcutaneous QHS   insulin aspart  0-9 Units Subcutaneous TID WC   leptospermum manuka honey  1 Application Topical Daily   levothyroxine  125 mcg Oral Q0600   lipase/protease/amylase  12,000 Units Oral TID WC   losartan  50 mg Oral Daily   multivitamin  1 tablet Oral QHS   polyethylene glycol  17 g Oral BID   senna-docusate  2 tablet Oral QHS   sevelamer carbonate  800 mg Oral TID WC   valACYclovir  500 mg Oral Daily   zinc sulfate  220 mg Oral Daily   Continuous Infusions:     LOS: 5 days     Sidney Ace, MD [facility]   If 7PM-7AM, please contact night-coverage  12/17/2021, 1:21 PM

## 2021-12-17 NOTE — Progress Notes (Addendum)
[facility]  ROUNDING NOTE   Subjective:   Tonya Myers is a 56 year old female with past medical history including depression, type 2 diabetes, asthma, hypertension, hypothyroidism, CLL, and end-stage renal disease on hemodialysis.   Patient seen sitting up in bed Speech improved from yesterday, not slurred without delayed response Continues to complain of numbness in hands and feet Breakfast tray at bedside, attempting to eat   Objective:  Vital signs in last 24 hours:  Temp:  [98 F (36.7 C)-98.8 F (37.1 C)] 98.1 F (36.7 C) (08/24 1210) Pulse Rate:  [51-81] 68 (08/24 1210) Resp:  [9-19] 18 (08/24 1210) BP: (132-172)/(79-111) 150/85 (08/24 1210) SpO2:  [94 %-100 %] 100 % (08/24 1210) Weight:  [69 kg-70.8 kg] 69 kg (08/24 0350)  Weight change:  Filed Weights   12/16/21 1354 12/16/21 1742 12/17/21 0350  Weight: 70.8 kg 69.9 kg 69 kg     Intake/Output: I/O last 3 completed shifts: In: 917.3 [P.O.:120; IV Piggyback:797.3] Out: 1000 [Other:1000]   Intake/Output this shift:  No intake/output data recorded.  Physical Exam: General: NAD  Head: Normocephalic, atraumatic. Moist oral mucosal membranes  Eyes: Anicteric  Lungs:  Clear to auscultation, normal effort  Heart: Regular rate and rhythm  Abdomen:  Soft, nontender  Extremities:  No peripheral edema.  Neurologic: Alert, moving upper extremities  Skin: No lesions  Access: Rt Permcath    Basic Metabolic Panel: Recent Labs  Lab 12/12/21 0546 12/13/21 0630 12/14/21 0637 12/15/21 0618 12/16/21 0854  NA 139 141 141 139 141  K 4.1 3.9 4.2 4.4 5.1  CL 102 106 106 104 105  CO2 '26 25 23 26 23  '$ GLUCOSE 82 78 100* 78 79  BUN 20 32* 47* 30* 48*  CREATININE 3.31* 4.77* 5.96* 3.53* 4.86*  CALCIUM 9.2 9.0 9.1 8.9 9.3     Liver Function Tests: Recent Labs  Lab 12/12/21 0546  AST 34  ALT 25  ALKPHOS 69  BILITOT 0.7  PROT 6.1*  ALBUMIN 3.3*    Recent Labs  Lab 12/12/21 0546  12/13/21 0630  LIPASE 96* 77*    No results for input(s): "AMMONIA" in the last 168 hours.  CBC: Recent Labs  Lab 12/12/21 0546 12/13/21 0630 12/14/21 0637 12/15/21 0618 12/16/21 0854  WBC 4.2 3.2* 3.7* 3.3* 3.5*  NEUTROABS 1.7  --  1.6* 1.3* 1.3*  HGB 9.1* 8.0* 8.0* 7.8* 7.9*  HCT 29.0* 25.8* 25.2* 24.0* 25.4*  MCV 95.1 95.9 95.8 94.9 94.4  PLT 206 169 172 PLATELET CLUMPS NOTED ON SMEAR, UNABLE TO ESTIMATE 176     Cardiac Enzymes: No results for input(s): "CKTOTAL", "CKMB", "CKMBINDEX", "TROPONINI" in the last 168 hours.  BNP: Invalid input(s): "POCBNP"  CBG: Recent Labs  Lab 12/16/21 1208 12/16/21 1825 12/16/21 2041 12/17/21 0802 12/17/21 1144  GLUCAP 99 123* 149* 37 95     Microbiology: Results for orders placed or performed during the hospital encounter of 12/12/21  Resp Panel by RT-PCR (Flu A&B, Covid) Anterior Nasal Swab     Status: None   Collection Time: 12/12/21  4:28 AM   Specimen: Anterior Nasal Swab  Result Value Ref Range Status   SARS Coronavirus 2 by RT PCR NEGATIVE NEGATIVE Final    Comment: (NOTE) SARS-CoV-2 target nucleic acids are NOT DETECTED.  The SARS-CoV-2 RNA is generally detectable in upper respiratory specimens during the acute phase of infection. The lowest concentration of SARS-CoV-2 viral copies this assay can detect is 138 copies/mL. A negative result does not preclude  SARS-Cov-2 infection and should not be used as the sole basis for treatment or other patient management decisions. A negative result may occur with  improper specimen collection/handling, submission of specimen other than nasopharyngeal swab, presence of viral mutation(s) within the areas targeted by this assay, and inadequate number of viral copies(<138 copies/mL). A negative result must be combined with clinical observations, patient history, and epidemiological information. The expected result is Negative.  Fact Sheet for Patients:   EntrepreneurPulse.com.au  Fact Sheet for Healthcare Providers:  IncredibleEmployment.be  This test is no t yet approved or cleared by the [place] FDA and  has been authorized for detection and/or diagnosis of SARS-CoV-2 by FDA under an Emergency Use Authorization (EUA). This EUA will remain  in effect (meaning this test can be used) for the duration of the COVID-19 declaration under Section 564(b)(1) of the Act, 21 U.S.C.section 360bbb-3(b)(1), unless the authorization is terminated  or revoked sooner.       Influenza A by PCR NEGATIVE NEGATIVE Final   Influenza B by PCR NEGATIVE NEGATIVE Final    Comment: (NOTE) The Xpert Xpress SARS-CoV-2/FLU/RSV plus assay is intended as an aid in the diagnosis of influenza from Nasopharyngeal swab specimens and should not be used as a sole basis for treatment. Nasal washings and aspirates are unacceptable for Xpert Xpress SARS-CoV-2/FLU/RSV testing.  Fact Sheet for Patients: EntrepreneurPulse.com.au  Fact Sheet for Healthcare Providers: IncredibleEmployment.be  This test is not yet approved or cleared by the [place] FDA and has been authorized for detection and/or diagnosis of SARS-CoV-2 by FDA under an Emergency Use Authorization (EUA). This EUA will remain in effect (meaning this test can be used) for the duration of the COVID-19 declaration under Section 564(b)(1) of the Act, 21 U.S.C. section 360bbb-3(b)(1), unless the authorization is terminated or revoked.  Performed at [facility], [address] [place], [place] [address]   Gastrointestinal Panel by PCR , Stool     Status: None   Collection Time: 12/12/21  8:26 AM   Specimen: Stool  Result Value Ref Range Status   Campylobacter species NOT DETECTED NOT DETECTED Final   Plesimonas shigelloides NOT DETECTED NOT DETECTED Final   Salmonella species NOT DETECTED NOT DETECTED Final    Yersinia enterocolitica NOT DETECTED NOT DETECTED Final   Vibrio species NOT DETECTED NOT DETECTED Final   Vibrio cholerae NOT DETECTED NOT DETECTED Final   Enteroaggregative E coli (EAEC) NOT DETECTED NOT DETECTED Final   Enteropathogenic E coli (EPEC) NOT DETECTED NOT DETECTED Final   Enterotoxigenic E coli (ETEC) NOT DETECTED NOT DETECTED Final   Shiga like toxin producing E coli (STEC) NOT DETECTED NOT DETECTED Final   Shigella/Enteroinvasive E coli (EIEC) NOT DETECTED NOT DETECTED Final   Cryptosporidium NOT DETECTED NOT DETECTED Final   Cyclospora cayetanensis NOT DETECTED NOT DETECTED Final   Entamoeba histolytica NOT DETECTED NOT DETECTED Final   Giardia lamblia NOT DETECTED NOT DETECTED Final   Adenovirus F40/41 NOT DETECTED NOT DETECTED Final   Astrovirus NOT DETECTED NOT DETECTED Final   Norovirus GI/GII NOT DETECTED NOT DETECTED Final   Rotavirus A NOT DETECTED NOT DETECTED Final   Sapovirus (I, II, IV, and V) NOT DETECTED NOT DETECTED Final    Comment: Performed at [facility], [address] [place], [place] [address]  C Difficile Quick Screen w PCR reflex     Status: Abnormal   Collection Time: 12/12/21  8:26 AM   Specimen: STOOL  Result Value Ref Range Status  C Diff antigen POSITIVE (A) NEGATIVE Final   C Diff toxin NEGATIVE NEGATIVE Final   C Diff interpretation Results are indeterminate. See PCR results.  Final    Comment: Performed at [facility], [address] [place], [place] [address]  C. Diff by PCR, Reflexed     Status: None   Collection Time: 12/12/21  8:26 AM  Result Value Ref Range Status   Toxigenic C. Difficile by PCR NEGATIVE NEGATIVE Final    Comment: Patient is colonized with non toxigenic C. difficile. May not need treatment unless significant symptoms are present. Performed at [facility], [address] [place], [place] [address]   Blood Culture (routine x 2)     Status: None   Collection Time: 12/12/21 10:16  PM   Specimen: BLOOD LEFT FOREARM  Result Value Ref Range Status   Specimen Description BLOOD LEFT FOREARM  Final   Special Requests   Final    BOTTLES DRAWN AEROBIC AND ANAEROBIC Blood Culture adequate volume   Culture   Final    NO GROWTH 5 DAYS Performed at [facility], [address], [place], [place] [address]    Report Status 12/17/2021 FINAL  Final  Blood Culture (routine x 2)     Status: None   Collection Time: 12/12/21 10:27 PM   Specimen: BLOOD  Result Value Ref Range Status   Specimen Description BLOOD LEFT WRIST  Final   Special Requests   Final    BOTTLES DRAWN AEROBIC AND ANAEROBIC Blood Culture adequate volume   Culture   Final    NO GROWTH 5 DAYS Performed at [facility], [address], [place], [place] [address]    Report Status 12/17/2021 FINAL  Final    Coagulation Studies: No results for input(s): "LABPROT", "INR" in the last 72 hours.   Urinalysis: No results for input(s): "COLORURINE", "LABSPEC", "PHURINE", "GLUCOSEU", "HGBUR", "BILIRUBINUR", "KETONESUR", "PROTEINUR", "UROBILINOGEN", "NITRITE", "LEUKOCYTESUR" in the last 72 hours.  Invalid input(s): "APPERANCEUR"     Imaging: EEG adult  Result Date: 12/16/2021 Lora Havens, MD     12/16/2021  1:41 PM Patient Name: Aarika Moon MRN: [id number] Epilepsy Attending: Lora Havens Referring Physician/Provider: Athena Masse, MD Date: 12/16/2021 Duration: 25.02 mins Patient history: 56 year old female with syncope.  EEG to evaluate for seizure. Level of alertness: Awake, asleep AEDs during EEG study: None Technical aspects: This EEG study was done with scalp electrodes positioned according to the 10-20 International system of electrode placement. Electrical activity was reviewed with band pass filter of 1-'70Hz'$ , sensitivity of 7 uV/mm, display speed of 58m/sec with a '60Hz'$  notched filter applied as appropriate. EEG data were recorded continuously and digitally stored.  Video  monitoring was available and reviewed as appropriate. Description: The posterior dominant rhythm consists of 8 Hz activity of moderate voltage (25-35 uV) seen predominantly in posterior head regions, symmetric and reactive to eye opening and eye closing. Sleep was characterized by vertex waves, sleep spindles (12 to 14 Hz), maximal frontocentral region. Physiologic photic driving was not seen during photic stimulation.  Hyperventilation was not performed.   IMPRESSION: This study is within normal limits. No seizures or epileptiform discharges were seen throughout the recording. A normal interictal EEG does not exclude nor support the diagnosis of epilepsy. Priyanka OBarbra Sarks  CT HEAD CODE STROKE WO CONTRAST`  Result Date: 12/15/2021 CLINICAL DATA:  Code stroke.  Weakness facial paralysis EXAM: CT HEAD WITHOUT CONTRAST TECHNIQUE: Contiguous axial images were obtained from  the base of the skull through the vertex without intravenous contrast. RADIATION DOSE REDUCTION: This exam was performed according to the departmental dose-optimization program which includes automated exposure control, adjustment of the mA and/or kV according to patient size and/or use of iterative reconstruction technique. COMPARISON:  None Available. FINDINGS: Brain: There is no mass, hemorrhage or extra-axial collection. The size and configuration of the ventricles and extra-axial CSF spaces are normal. There is hypoattenuation of the periventricular white matter, most commonly indicating chronic ischemic microangiopathy. Vascular: No abnormal hyperdensity of the major intracranial arteries or dural venous sinuses. No intracranial atherosclerosis. Skull: The visualized skull base, calvarium and extracranial soft tissues are normal. Sinuses/Orbits: No fluid levels or advanced mucosal thickening of the visualized paranasal sinuses. No mastoid or middle ear effusion. The orbits are normal. ASPECTS [facility] Stroke Program Early CT Score) -  Ganglionic level infarction (caudate, lentiform nuclei, internal capsule, insula, M1-M3 cortex): 7 - Supraganglionic infarction (M4-M6 cortex): 3 Total score (0-10 with 10 being normal): 10 IMPRESSION: 1. No acute hemorrhage or mass lesion. 2. ASPECTS is 10. These results were called by telephone at the time of interpretation on 12/15/2021 at 9:02 pm to Dr. Damita Dunnings, Who verbally acknowledged these results. Electronically Signed   By: Ulyses Jarred M.D.   On: 12/15/2021 21:06     Medications:       (feeding supplement) PROSource Plus  30 mL Oral TID BM   vitamin C  500 mg Oral BID   aspirin EC  81 mg Oral Daily   atorvastatin  20 mg Oral Daily   Chlorhexidine Gluconate Cloth  6 each Topical Daily   feeding supplement  1 Container Oral TID BM   [START ON 12/18/2021] gabapentin  100 mg Oral Q M,W,F-1800   heparin  5,000 Units Subcutaneous Q8H   insulin aspart  0-5 Units Subcutaneous QHS   insulin aspart  0-9 Units Subcutaneous TID WC   leptospermum manuka honey  1 Application Topical Daily   levothyroxine  125 mcg Oral Q0600   lipase/protease/amylase  12,000 Units Oral TID WC   losartan  50 mg Oral Daily   multivitamin  1 tablet Oral QHS   polyethylene glycol  17 g Oral BID   senna-docusate  2 tablet Oral QHS   sevelamer carbonate  800 mg Oral TID WC   valACYclovir  500 mg Oral Daily   zinc sulfate  220 mg Oral Daily   acetaminophen, albuterol, dextromethorphan-guaiFENesin, diclofenac Sodium, fluticasone, hydrALAZINE, iohexol, ondansetron (ZOFRAN) IV, oxyCODONE-acetaminophen  Assessment/ Plan:  Ms. Cornelious Diven is a 56 y.o.  female with past medical history including depression, type 2 diabetes, asthma, hypertension, hypothyroidism, CLL, and end-stage renal disease on hemodialysis.  [facility]/MWF/right PermCath/66.4kg  End-stage renal disease on hemodialysis.    -Dialysis received yesterday, UF 1 L achieved.  Next treatment scheduled for Friday.  We will request weight be  obtained through lift to help determine dry weight adjustment.  Renal navigator monitoring patient, therapy currently recommending rehab placement at discharge.  Will monitor and determine outpatient needs.  2. Anemia of chronic kidney disease Lab Results  Component Value Date   HGB 7.9 (L) 12/16/2021  Patient receives [place] outpatient.  Will order EPO with dialysis treatments.  3. Secondary Hyperparathyroidism: with outpatient labs: PTH 235, phosphorus 6.7, calcium 9.0 on 11/30/2021.   Lab Results  Component Value Date   CALCIUM 9.3 12/16/2021   PHOS 3.2 11/28/2020    Will monitor bone minerals during this admission.  4. Diabetes  mellitus type II with chronic kidney disease/renal manifestations: insulin dependent. Home regimen includes lispro. Most recent hemoglobin A1c is 4.9 on 12/09/2021.   Primary team will manage Sliding Scale insulin.   5.  Syncopal episode during dialysis treatment.  Cardiology consulted due to elevated troponins overnight.  Echo shows EF 55 to 60%.  Home cardiac monitor remains in place from previous admission.    6.  Nausea/sacral wound/retained stool/C. difficile positive. Management as per internal medicine team.  Currently has received aztreonam, cefepime, metronidazole and vancomycin IV -Fecal impaction resolved -General surgery consulted and will defer I&D.  - WOC following    LOS: 5   8/24/202312:32 PM

## 2021-12-17 NOTE — TOC Initial Note (Signed)
Transition of Care [facility]) - Initial/Assessment Note    Patient Details  Name: Tonya Myers MRN: [id number] Date of Birth: 03-10-1966  Transition of Care [facility]) [name] Contact:    Candie Chroman, [name] Phone Number: 12/17/2021, 1:30 PM  Clinical Narrative:  CSW met with patient. Husband at bedside. CSW introduced role and explained that therapy recommendations would be discussed. Patient is agreeable to SNF placement. No facility preference. Gave CMS scores for facilities within 25 miles of her zip code. No further concerns. CSW encouraged patient and her husband to contact CSW as needed. CSW will continue to follow patient and her husband for support and facilitate discharge to SNF once medically stable.                Expected Discharge Plan: Skilled Nursing Facility Barriers to Discharge: Continued Medical Work up, SNF Pending bed offer   Patient Goals and CMS Choice   CMS Medicare.gov Compare Post Acute Care list provided to:: Patient    Expected Discharge Plan and Services Expected Discharge Plan: [place] Choice: [place] arrangements for the past 2 months: Single Family Home                                      Prior Living Arrangements/Services Living arrangements for the past 2 months: Single Family Home Lives with:: Spouse Patient language and need for interpreter reviewed:: Yes Do you feel safe going back to the place where you live?: Yes      Need for Family Participation in Patient Care: Yes (Comment) Care giver support system in place?: Yes (comment) Current home services: Home OT, Home PT, Home RN Criminal Activity/Legal Involvement Pertinent to Current Situation/Hospitalization: No - Comment as needed  Activities of Daily Living      Permission Sought/Granted Permission sought to share information with : Facility Sport and exercise psychologist, Family Supports Permission granted to share  information with : Yes, Verbal Permission Granted  Share Information with NAME: Gita Kudo  Permission granted to share info w AGENCY: SNF's  Permission granted to share info w Relationship: Husband  Permission granted to share info w Contact Information: [phone]  Emotional Assessment Appearance:: Appears stated age Attitude/Demeanor/Rapport: Engaged, Gracious Affect (typically observed): Accepting, Appropriate, Calm, Pleasant Orientation: : Oriented to Self, Oriented to Place, Oriented to  Time, Oriented to Situation Alcohol / Substance Use: Not Applicable Psych Involvement: No (comment)  Admission diagnosis:  Generalized weakness [R53.1] C. difficile colitis [A04.72] Sepsis ([place]) [A41.9] Pressure injury of skin of sacral region, unspecified injury stage [L89.159] Patient Active Problem List   Diagnosis Date Noted   Malnutrition of moderate degree 12/17/2021   History of herpes genitalis 12/14/2021   Sepsis ([place]) 12/12/2021   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   Sacral wound 12/12/2021   Myocardial injury 12/12/2021   Closed fracture of sternum 12/12/2021   Chronic pancreatitis ([place]) 12/12/2021   Asthma 12/12/2021   Anemia in ESRD (end-stage renal disease) ([place]) 12/12/2021   Type II diabetes mellitus with renal manifestations ([place]) 12/12/2021   UTI (urinary tract infection) 12/12/2021   Diarrhea 12/12/2021   Pressure injury of skin 12/11/2021   Elevated troponin 12/10/2021   Syncope 12/09/2021   Dyslipidemia 12/09/2021   Type 2 diabetes mellitus with peripheral neuropathy ([place]) 12/09/2021   GERD without esophagitis 12/09/2021   End-stage renal disease on hemodialysis ([place]) 12/09/2021   Thrombocytopenia ([place])  12/11/2020   FUO (fever of unknown origin) 12/11/2020   Pancytopenia ([place]) 12/11/2020   Oral thrush 12/11/2020   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[place]) 12/10/2020   Right arm weakness 10/21/2020   Quadriplegia ([place]) 10/10/2020   Wheelchair dependence 10/10/2020   Transient  neurologic deficit 12/15/21 10/02/2020   Functional paraparesis 10/02/2020   Left upper quadrant abdominal pain    Pain    Chronic pain of both knees    Hypokalemia    Urinary retention    Slow transit constipation    Meningitis    Labile blood glucose    Uncontrolled type 2 diabetes mellitus with hyperosmolar nonketotic hyperglycemia (HCC)    Transaminitis    Sleep disturbance    Anemia of chronic disease    Bacterial spinal epidural abscess 06/27/2020   Obesity (BMI 30.0-34.9)    Hypomagnesemia    Hypernatremia    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HCC)    Epidural abscess    Pneumococcal meningitis    Hyperglycemic crisis in diabetes mellitus ([place]) 58/72/7618   Acute metabolic encephalopathy 48/59/2763   Severe sepsis (HCC)    Diabetic ketoacidosis without coma associated with type 2 diabetes mellitus ([place])    AKI (acute kidney injury) ([place])    Elevated LFTs    Acute encephalopathy 06/09/2020   Bacterial vaginosis 11/01/2016   Chronic right shoulder pain 03/23/2016   Numbness and tingling 03/23/2016   Uncontrolled type 2 diabetes mellitus with hyperglycemia, without long-term current use of insulin ([place]) 10/27/2015   DDD (degenerative disc disease), cervical 07/29/2015   Cervical disc disorder with radiculopathy of cervical region 07/29/2015   Cervical facet syndrome 07/29/2015   DDD (degenerative disc disease), lumbar 07/29/2015   Facet syndrome, lumbar 07/29/2015   Bilateral occipital neuralgia 07/29/2015   Sacroiliac joint dysfunction 07/29/2015   DJD of shoulder 07/29/2015   Herpes simplex vulvovaginitis 05/29/2015   Acute hemorrhoid 03/11/2015   Personal history of CLL (chronic lymphocytic leukemia) 01/15/2015   Hypothyroidism 12/26/2014   Arthritis 12/26/2014   Carpal tunnel syndrome, bilateral 12/26/2014   Controlled type 2 diabetes mellitus without complication, without long-term current use of insulin ([place]) 12/26/2014   Essential hypertension 12/26/2014   Hyperlipidemia,  mixed 12/26/2014   PCP:  [name], MD Pharmacy:   [id number] [place] [place] [place]Phone: [phone]: [phone]    Social Determinants of Health (SDOH) Interventions    Readmission Risk Interventions    11/24/2020    2:17 PM  Readmission Risk Prevention Plan  Transportation Screening Complete  PCP or Specialist Appt within 3-5 Days Complete  [place]RI or [place]  Social Work Consult for RM[place]Planning/Counseling Complete  Palliative Care Screening Not Applicable  Medication Review (Press photographer Complete

## 2021-12-17 NOTE — Progress Notes (Signed)
Hemodialysis patient known at [facility] ([place]) MWF 11:55am. Met with patient and husband, confirmed chair time and clinic. Patient did not state any dialysis concerns. I will continue to follow for dialysis discharge planning.

## 2021-12-18 DIAGNOSIS — R531 Weakness: Secondary | ICD-10-CM | POA: Diagnosis not present

## 2021-12-18 DIAGNOSIS — Z992 Dependence on renal dialysis: Secondary | ICD-10-CM | POA: Diagnosis not present

## 2021-12-18 DIAGNOSIS — N186 End stage renal disease: Secondary | ICD-10-CM | POA: Diagnosis not present

## 2021-12-18 DIAGNOSIS — R55 Syncope and collapse: Secondary | ICD-10-CM | POA: Diagnosis not present

## 2021-12-18 DIAGNOSIS — S31000D Unspecified open wound of lower back and pelvis without penetration into retroperitoneum, subsequent encounter: Secondary | ICD-10-CM | POA: Diagnosis not present

## 2021-12-18 LAB — GLUCOSE, CAPILLARY
Glucose-Capillary: 79 mg/dL (ref 70–99)
Glucose-Capillary: 85 mg/dL (ref 70–99)
Glucose-Capillary: 123 mg/dL — ABNORMAL HIGH (ref 70–99)
Glucose-Capillary: 129 mg/dL — ABNORMAL HIGH (ref 70–99)

## 2021-12-18 MED ORDER — EPOETIN ALFA 10000 UNIT/ML IJ SOLN
4000.0000 [IU] | INTRAMUSCULAR | Status: DC
  Administered 2021-12-18 – 2021-12-23 (×2): 4000 [IU] via INTRAVENOUS
  Filled 2021-12-18 (×2): qty 1

## 2021-12-18 MED ORDER — HEPARIN SODIUM (PORCINE) 1000 UNIT/ML IJ SOLN
INTRAMUSCULAR | Status: AC
  Administered 2021-12-18: 10000 [IU]
  Filled 2021-12-18: qty 10

## 2021-12-18 MED ORDER — EPOETIN ALFA 4000 UNIT/ML IJ SOLN
INTRAMUSCULAR | Status: AC
  Administered 2021-12-18: 4000 [IU]
  Filled 2021-12-18: qty 1

## 2021-12-18 NOTE — Progress Notes (Signed)
Post HD RN assessment 

## 2021-12-18 NOTE — Progress Notes (Signed)
Pt 3.5 hr HD complete w/ no complications. Report to primary RN. Pt alert, vss, no c/o.  Start: 1410 End: 1740 2013m fluid removed 84L BVP Epogen 4000u w/ HD UTA bed weight d/t bed error. Pt unable to stand.

## 2021-12-18 NOTE — Progress Notes (Signed)
PT Cancellation Note  Patient Details Name: Tonya Myers MRN: [id number] DOB: [birth year]   Cancelled Treatment:    Reason Eval/Treat Not Completed: Patient at procedure or test/unavailable Pt noted to be off the floor at dialysis. Will f/u as able.  Lavone Nian, PT, DPT 12/18/21, 3:20 PM  Waunita Schooner 12/18/2021, 3:19 PM

## 2021-12-18 NOTE — TOC Progression Note (Addendum)
Transition of Care [facility]) - Progression Note    Patient Details  Name: Tonya Myers MRN: [id number] Date of Birth: 06-24-1965  Transition of Care [facility]) [name] [place], [name] Phone Number: 12/18/2021, 11:32 AM  Clinical Narrative:  Patient has one bed offer so far from [facility]. [facility] and [facility] have not responded. All others in [place] declined. Husband asked about [facility] in [place] since patient had been there before and had a good experience. Explained that patient would have to change her HD center to go there if they accepted her. Patient said she feels like that is too far away. They will discuss and follow up with CSW with decision.   1:03 pm: Patient and husband concerned with [facility] online reviews. They asked that [facility] review her referral again. Spoke with admissions coordinator and sent referral in secure email. Asked [facility] admissions coordinator to check how many days patient has left from being at [facility] before. Updated HD coordinator.  Expected Discharge Plan: [place] Barriers to Discharge: Continued Medical Work up, SNF Pending bed offer  Expected Discharge Plan and Services Expected Discharge Plan: [place] Choice: [place] arrangements for the past 2 months: Single Family Home                                       Social Determinants of Health (SDOH) Interventions    Readmission Risk Interventions    11/24/2020    2:17 PM  Readmission Risk Prevention Plan  Transportation Screening Complete  PCP or Specialist Appt within 3-5 Days Complete  HRI or [place] Complete  Social Work Consult for [place] Planning/Counseling Complete  Palliative Care Screening Not Applicable  Medication Review Press photographer) Complete

## 2021-12-18 NOTE — Progress Notes (Signed)
[facility]  ROUNDING NOTE   Subjective:   Tonya Myers is a 56 year old female with past medical history including depression, type 2 diabetes, asthma, hypertension, hypothyroidism, CLL, and end-stage renal disease on hemodialysis.   Patient resting comfortably, alert and oriented No family at bedside Appetite improving Remains on room air Continues to have numbness and tingling in hands and feet.   Objective:  Vital signs in last 24 hours:  Temp:  [97.8 F (36.6 C)-99.3 F (37.4 C)] 97.8 F (36.6 C) (08/25 1355) Pulse Rate:  [62-97] 65 (08/25 1500) Resp:  [12-18] 15 (08/25 1500) BP: (115-164)/(67-96) 134/91 (08/25 1500) SpO2:  [99 %-100 %] 100 % (08/25 1500) Weight:  [69.9 kg-74.7 kg] 74.7 kg (08/25 0443)  Weight change: -0.9 kg Filed Weights   12/17/21 0350 12/17/21 1800 12/18/21 0443  Weight: 69 kg 69.9 kg 74.7 kg     Intake/Output: I/O last 3 completed shifts: In: 480 [P.O.:480] Out: 0    Intake/Output this shift:  No intake/output data recorded.  Physical Exam: General: NAD  Head: Normocephalic, atraumatic. Moist oral mucosal membranes  Eyes: Anicteric  Lungs:  Clear to auscultation, normal effort  Heart: Regular rate and rhythm  Abdomen:  Soft, nontender  Extremities:  No peripheral edema.  Neurologic: Alert, moving upper extremities  Skin: No lesions  Access: Rt Permcath    Basic Metabolic Panel: Recent Labs  Lab 12/12/21 0546 12/13/21 0630 12/14/21 0637 12/15/21 0618 12/16/21 0854  NA 139 141 141 139 141  K 4.1 3.9 4.2 4.4 5.1  CL 102 106 106 104 105  CO2 '26 25 23 26 23  '$ GLUCOSE 82 78 100* 78 79  BUN 20 32* 47* 30* 48*  CREATININE 3.31* 4.77* 5.96* 3.53* 4.86*  CALCIUM 9.2 9.0 9.1 8.9 9.3     Liver Function Tests: Recent Labs  Lab 12/12/21 0546  AST 34  ALT 25  ALKPHOS 69  BILITOT 0.7  PROT 6.1*  ALBUMIN 3.3*    Recent Labs  Lab 12/12/21 0546 12/13/21 0630  LIPASE 96* 77*    No results for input(s):  "AMMONIA" in the last 168 hours.  CBC: Recent Labs  Lab 12/12/21 0546 12/13/21 0630 12/14/21 0637 12/15/21 0618 12/16/21 0854  WBC 4.2 3.2* 3.7* 3.3* 3.5*  NEUTROABS 1.7  --  1.6* 1.3* 1.3*  HGB 9.1* 8.0* 8.0* 7.8* 7.9*  HCT 29.0* 25.8* 25.2* 24.0* 25.4*  MCV 95.1 95.9 95.8 94.9 94.4  PLT 206 169 172 PLATELET CLUMPS NOTED ON SMEAR, UNABLE TO ESTIMATE 176     Cardiac Enzymes: No results for input(s): "CKTOTAL", "CKMB", "CKMBINDEX", "TROPONINI" in the last 168 hours.  BNP: Invalid input(s): "POCBNP"  CBG: Recent Labs  Lab 12/17/21 1144 12/17/21 1758 12/17/21 2117 12/18/21 0832 12/18/21 1224  GLUCAP 95 102* 188* 79 85     Microbiology: Results for orders placed or performed during the hospital encounter of 12/12/21  Resp Panel by RT-PCR (Flu A&B, Covid) Anterior Nasal Swab     Status: None   Collection Time: 12/12/21  4:28 AM   Specimen: Anterior Nasal Swab  Result Value Ref Range Status   SARS Coronavirus 2 by RT PCR NEGATIVE NEGATIVE Final    Comment: (NOTE) SARS-CoV-2 target nucleic acids are NOT DETECTED.  The SARS-CoV-2 RNA is generally detectable in upper respiratory specimens during the acute phase of infection. The lowest concentration of SARS-CoV-2 viral copies this assay can detect is 138 copies/mL. A negative result does not preclude SARS-Cov-2 infection and should not be  used as the sole basis for treatment or other patient management decisions. A negative result may occur with  improper specimen collection/handling, submission of specimen other than nasopharyngeal swab, presence of viral mutation(s) within the areas targeted by this assay, and inadequate number of viral copies(<138 copies/mL). A negative result must be combined with clinical observations, patient history, and epidemiological information. The expected result is Negative.  Fact Sheet for Patients:  EntrepreneurPulse.com.au  Fact Sheet for Healthcare Providers:   IncredibleEmployment.be  This test is no t yet approved or cleared by the [place] FDA and  has been authorized for detection and/or diagnosis of SARS-CoV-2 by FDA under an Emergency Use Authorization (EUA). This EUA will remain  in effect (meaning this test can be used) for the duration of the COVID-19 declaration under Section 564(b)(1) of the Act, 21 U.S.C.section 360bbb-3(b)(1), unless the authorization is terminated  or revoked sooner.       Influenza A by PCR NEGATIVE NEGATIVE Final   Influenza B by PCR NEGATIVE NEGATIVE Final    Comment: (NOTE) The Xpert Xpress SARS-CoV-2/FLU/RSV plus assay is intended as an aid in the diagnosis of influenza from Nasopharyngeal swab specimens and should not be used as a sole basis for treatment. Nasal washings and aspirates are unacceptable for Xpert Xpress SARS-CoV-2/FLU/RSV testing.  Fact Sheet for Patients: EntrepreneurPulse.com.au  Fact Sheet for Healthcare Providers: IncredibleEmployment.be  This test is not yet approved or cleared by the [place] FDA and has been authorized for detection and/or diagnosis of SARS-CoV-2 by FDA under an Emergency Use Authorization (EUA). This EUA will remain in effect (meaning this test can be used) for the duration of the COVID-19 declaration under Section 564(b)(1) of the Act, 21 U.S.C. section 360bbb-3(b)(1), unless the authorization is terminated or revoked.  Performed at [facility], [address] [place], [place] [address]   Gastrointestinal Panel by PCR , Stool     Status: None   Collection Time: 12/12/21  8:26 AM   Specimen: Stool  Result Value Ref Range Status   Campylobacter species NOT DETECTED NOT DETECTED Final   Plesimonas shigelloides NOT DETECTED NOT DETECTED Final   Salmonella species NOT DETECTED NOT DETECTED Final   Yersinia enterocolitica NOT DETECTED NOT DETECTED Final   Vibrio species NOT  DETECTED NOT DETECTED Final   Vibrio cholerae NOT DETECTED NOT DETECTED Final   Enteroaggregative E coli (EAEC) NOT DETECTED NOT DETECTED Final   Enteropathogenic E coli (EPEC) NOT DETECTED NOT DETECTED Final   Enterotoxigenic E coli (ETEC) NOT DETECTED NOT DETECTED Final   Shiga like toxin producing E coli (STEC) NOT DETECTED NOT DETECTED Final   Shigella/Enteroinvasive E coli (EIEC) NOT DETECTED NOT DETECTED Final   Cryptosporidium NOT DETECTED NOT DETECTED Final   Cyclospora cayetanensis NOT DETECTED NOT DETECTED Final   Entamoeba histolytica NOT DETECTED NOT DETECTED Final   Giardia lamblia NOT DETECTED NOT DETECTED Final   Adenovirus F40/41 NOT DETECTED NOT DETECTED Final   Astrovirus NOT DETECTED NOT DETECTED Final   Norovirus GI/GII NOT DETECTED NOT DETECTED Final   Rotavirus A NOT DETECTED NOT DETECTED Final   Sapovirus (I, II, IV, and V) NOT DETECTED NOT DETECTED Final    Comment: Performed at [facility], [address] [place], [place] [address]  C Difficile Quick Screen w PCR reflex     Status: Abnormal   Collection Time: 12/12/21  8:26 AM   Specimen: STOOL  Result Value Ref Range Status   C Diff antigen POSITIVE (A)  NEGATIVE Final   C Diff toxin NEGATIVE NEGATIVE Final   C Diff interpretation Results are indeterminate. See PCR results.  Final    Comment: Performed at [facility], [address] [place], [place] [address]  C. Diff by PCR, Reflexed     Status: None   Collection Time: 12/12/21  8:26 AM  Result Value Ref Range Status   Toxigenic C. Difficile by PCR NEGATIVE NEGATIVE Final    Comment: Patient is colonized with non toxigenic C. difficile. May not need treatment unless significant symptoms are present. Performed at [facility], [address] [place], [place] [address]   Blood Culture (routine x 2)     Status: None   Collection Time: 12/12/21 10:16 PM   Specimen: BLOOD LEFT FOREARM  Result Value Ref Range Status   Specimen  Description BLOOD LEFT FOREARM  Final   Special Requests   Final    BOTTLES DRAWN AEROBIC AND ANAEROBIC Blood Culture adequate volume   Culture   Final    NO GROWTH 5 DAYS Performed at [facility], [address] [place], [place] [address]    Report Status 12/17/2021 FINAL  Final  Blood Culture (routine x 2)     Status: None   Collection Time: 12/12/21 10:27 PM   Specimen: BLOOD  Result Value Ref Range Status   Specimen Description BLOOD LEFT WRIST  Final   Special Requests   Final    BOTTLES DRAWN AEROBIC AND ANAEROBIC Blood Culture adequate volume   Culture   Final    NO GROWTH 5 DAYS Performed at [facility], [address], [place], [place] [address]    Report Status 12/17/2021 FINAL  Final    Coagulation Studies: No results for input(s): "LABPROT", "INR" in the last 72 hours.   Urinalysis: No results for input(s): "COLORURINE", "LABSPEC", "PHURINE", "GLUCOSEU", "HGBUR", "BILIRUBINUR", "KETONESUR", "PROTEINUR", "UROBILINOGEN", "NITRITE", "LEUKOCYTESUR" in the last 72 hours.  Invalid input(s): "APPERANCEUR"     Imaging: No results found.   Medications:       (feeding supplement) PROSource Plus  30 mL Oral TID BM   vitamin C  500 mg Oral BID   aspirin EC  81 mg Oral Daily   atorvastatin  20 mg Oral Daily   Chlorhexidine Gluconate Cloth  6 each Topical Daily   feeding supplement  1 Container Oral TID BM   gabapentin  100 mg Oral Q M,W,F-1800   heparin  5,000 Units Subcutaneous Q8H   insulin aspart  0-5 Units Subcutaneous QHS   insulin aspart  0-9 Units Subcutaneous TID WC   lacosamide  100 mg Oral BID   lacosamide  50 mg Oral Q M,W,F-HD   leptospermum manuka honey  1 Application Topical Daily   levothyroxine  125 mcg Oral Q0600   lipase/protease/amylase  12,000 Units Oral TID WC   losartan  50 mg Oral Daily   multivitamin  1 tablet Oral QHS   polyethylene glycol  17 g Oral BID   senna-docusate  2 tablet Oral QHS   sevelamer  carbonate  800 mg Oral TID WC   valACYclovir  500 mg Oral Daily   zinc sulfate  220 mg Oral Daily   acetaminophen, albuterol, dextromethorphan-guaiFENesin, diclofenac Sodium, fluticasone, hydrALAZINE, iohexol, ondansetron (ZOFRAN) IV, oxyCODONE-acetaminophen  Assessment/ Plan:  Ms. Rajah Lamba is a 56 y.o.  female with past medical history including depression, type 2 diabetes, asthma, hypertension, hypothyroidism, CLL, and end-stage renal disease on hemodialysis.  [facility]  [place]/MWF/right PermCath/66.4kg  End-stage renal disease on hemodialysis.    -Scheduled to receive dialysis later today.  Requested lift weight yesterday, 69.9 kg.  UF 1 to 1.5 L as tolerated.  Next treatment scheduled for Monday.  Patient agreeable to rehab placement, requesting placement closer to [facility].  Renal navigator aware and is currently seeking outpatient transfer.  2. Anemia of chronic kidney disease Lab Results  Component Value Date   HGB 7.9 (L) 12/16/2021  Patient receives [place] outpatient.  Will order low-dose EPO with treatments.  3. Secondary Hyperparathyroidism: with outpatient labs: PTH 235, phosphorus 6.7, calcium 9.0 on 11/30/2021.   Lab Results  Component Value Date   CALCIUM 9.3 12/16/2021   PHOS 3.2 11/28/2020    Calcium and phosphorus within acceptable range.  4. 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 Home regimen includes lispro. Most recent hemoglobin A1c is 4.9 on 12/09/2021.   Primary team will manage Sliding Scale insulin.   5.  Syncopal episode during dialysis treatment.  Cardiology consulted due to elevated troponins overnight.  Echo shows EF 55 to 60%.  Home cardiac monitor remains in place from previous admission.    6.  Nausea/sacral wound/retained stool/C. difficile positive. Management as per internal medicine team.  Currently has received aztreonam, cefepime, metronidazole and vancomycin IV -Fecal  impaction resolved -General surgery consulted and will defer I&D.  - WOC following    LOS: 6 [place] 8/25/20233:48 PM

## 2021-12-18 NOTE — Progress Notes (Signed)
Pre HD RN assessment 

## 2021-12-18 NOTE — Progress Notes (Signed)
PROGRESS NOTE    Tonya Myers  KDT:[id number] DOB: [birth year] DOA: 12/12/2021 PCP: Tonya Pink, MD    Brief Narrative:  56 y.o. 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    Pt was recently hospitalized from 8/16 - 8/18 due to syncope.  Per report, dialysis staff performed 2 minutes of CPR prior to arrival to [facility]. Pt had elevated trop up to  452.  Cardiology was consulted, suspected that her syncopal episode was vasovagal/hypotensive in nature, and troponin elevation was demand ischemia in that setting. 2 D echo on 8/17 showed LVEF of 55-60% without WMA's or other valvular pathologies. Pt also has had UTI and was started on Cipro at the discharge.    Patient states that after she went home, she has worsening weakness.  She has fever and chills.  She continues to have symptoms of UTI, including dysuria, burning on urination and urinary frequency.  She also reports nausea, diarrhea and mild lower abdominal pain.  No vomiting.  Patient has had 5 times of diarrhea.   C. difficile assay negative.  Antigen positive toxin negative.  Diarrhea has resolved.  No clinical indications of acute C. difficile infection.  Sacral decubitus ulcer has been evaluated by surgery.  No surgical intervention warranted.  Nephrology following for inpatient hemodialysis needs.  8/23: Rapid response called last night due to decreased level of responsiveness and bilateral lower extremity weakness.  Code stroke called.  CT head negative.  Patient offered MRI but refused.  Stated she was simply tired.  When evaluated the following morning no focal  neurologic deficit noted however patient is noted to have slow responses and be somewhat tremulous.  Discussed with nephrology.  Possible effect from pregabalin.  Dose decreased to renal dose of 75 mg nightly.  Patient requested dc of lyrica and attempt at gabapentin.  Seems to be clinically improved on 8/25   Assessment & Plan:   Principal Problem:   Diarrhea Active Problems:   Stercoral colitis   Sacral wound   UTI (urinary tract infection)   End-stage renal disease on hemodialysis (HCC)   Essential hypertension   Hypothyroidism   Dyslipidemia   Myocardial injury   Type II diabetes mellitus with renal manifestations (HCC)   Anemia in ESRD (end-stage renal disease) (HCC)   Chronic pancreatitis (HCC)   Asthma   Closed fracture of sternum   Transient neurologic deficit 12/15/21   History of herpes genitalis   Malnutrition of moderate degree  Decreased LOC  weakness Tremulousness Rapid response called 8/22 for decreased level responsiveness and weakness.  Also associated with decreased grip strength.  Unclear etiology.  CT head negative.  Code stroke called.  MRI was offered but patient declined.  Stating fatigue.  The following morning the patient is neurologically intact however is continuing to endorse weakness and has some tremulous this on exam.  Delayed response time. Suspect medication effect secondary to Lyrica Plan: Low suspicion for acute CVA Continue HD dosing gabapentin   Stercoral colitis Diarrhea Patient presented with diarrhea in the setting of fecal impaction resulting in stercoral colitis.  Bowel regimen with disimpaction was effective and now patient is having bowel movements. Patient is now completed  5 days of cefepime and Flagyl Plan: No further antibiotics   Stage IV sacral decubitus ulcer Seen and evaluated by general surgery.  No indication for surgical intervention at this time Wound care consult appreciated Plan: No further antibiotics Wound  care Therapy as tolerated Need skilled nursing facility  Possible urinary tract infection Patient was discharged on ciprofloxacin 1 day prior to presentation Still has typical symptoms of UTI Has completed empiric course of treatment No further antibiotics  ESRD on HD Nephrology following for inpatient HD needs HD 8/23, HD 8/25 Otherwise medically ready for discharge  Essential hypertension Poor control Continue PTA Cozaar Scheduled hydralazine PRN IV hydralazine  Hypothyroidism PTA Synthroid  Elevated troponins No significant delta Low suspicion for ACS  Type II diabetes mellitus with renal manifestations ([place]) Sliding scale Last A1c 4.9, well controlled  Anemia of chronic kidney disease Hemoglobin stable  Chronic pancreatitis Normal LFTs and lipase Creon started this admission  History of asthma Stable, unclear severity P.o. and bronchodilators  Closed fracture of sternum No indication for surgical intervention Multimodal pain control  History of herpes genitalis PTA suppressive valacyclovir    DVT prophylaxis: SQ heparin Code Status: Full Family Communication: Tonya Myers [phone] on 8/22 Disposition Plan: Status is: Inpatient Remains inpatient appropriate because: Patient medically stable and can discharge to skilled nursing facility once we have an available bed   Level of care: Progressive  Consultants:  Nephrology  Procedures:  None  Antimicrobials:  Subjective: Patient seen and examined.  Endorses some weakness but improved from prior  Objective: Vitals:   12/18/21 0034 12/18/21 0422 12/18/21 0443 12/18/21 0818  BP: 116/72 115/67  (!) 147/80  Pulse: 97 72  62  Resp: '18 18  12  '$ Temp: 98.6 F (37 C) 98.3 F (36.8 C)  98.1 F (36.7 C)  TempSrc:    Oral  SpO2: 100% 100%  100%  Weight:   74.7 kg     Intake/Output Summary (Last 24 hours) at 12/18/2021 1131 Last data filed at 12/17/2021 2000 Gross per 24 hour  Intake  240 ml  Output --  Net 240 ml   Filed Weights   12/17/21 0350 12/17/21 1800 12/18/21 0443  Weight: 69 kg 69.9 kg 74.7 kg    Examination:  General exam: No acute distress.  Energy level appears improved this morning Respiratory system: Lungs clear.  Normal work of breathing.  Room air Cardiovascular system: S2, RRR, no murmurs, no pedal edema Gastrointestinal system: Soft, NT/ND, normal bowel sounds Central nervous system: Alert and oriented.  No focal deficits.  No tremors Extremities: Decreased power BLE Skin: Stage IV sacral decubitus Psychiatry: Judgement and insight appear normal. Mood & affect appropriate.     Data Reviewed: I have personally reviewed following labs and imaging studies  CBC: Recent Labs  Lab 12/12/21 0546 12/13/21 0630 12/14/21 0637 12/15/21 0618 12/16/21 0854  WBC 4.2 3.2* 3.7* 3.3* 3.5*  NEUTROABS 1.7  --  1.6* 1.3* 1.3*  HGB 9.1* 8.0* 8.0* 7.8* 7.9*  HCT 29.0* 25.8* 25.2* 24.0* 25.4*  MCV 95.1 95.9 95.8 94.9 94.4  PLT 206 169 172 PLATELET CLUMPS NOTED ON SMEAR, UNABLE TO ESTIMATE 981   Basic Metabolic Panel: Recent Labs  Lab 12/12/21 0546 12/13/21 0630 12/14/21 0637 12/15/21 0618 12/16/21 0854  NA 139 141 141 139 141  K 4.1 3.9 4.2 4.4 5.1  CL 102 106 106 104 105  CO2 '26 25 23 26 23  '$ GLUCOSE 82 78 100* 78 79  BUN 20  32* 47* 30* 48*  CREATININE 3.31* 4.77* 5.96* 3.53* 4.86*  CALCIUM 9.2 9.0 9.1 8.9 9.3   GFR: Estimated Creatinine Clearance: 14.6 mL/min (A) (by C-G formula based on SCr of 4.86 mg/dL (H)). Liver Function Tests: Recent Labs  Lab 12/12/21 0546  AST 34  ALT 25  ALKPHOS 69  BILITOT 0.7  PROT 6.1*  ALBUMIN 3.3*   Recent Labs  Lab 12/12/21 0546 12/13/21 0630  LIPASE 96* 77*   No results for input(s): "AMMONIA" in the last 168 hours. Coagulation Profile: Recent Labs  Lab 12/12/21 0546  INR 1.0   Cardiac Enzymes: No results for input(s): "CKTOTAL", "CKMB", "CKMBINDEX", "TROPONINI" in the last 168  hours. BNP (last 3 results) No results for input(s): "PROBNP" in the last 8760 hours. HbA1C: No results for input(s): "HGBA1C" in the last 72 hours. CBG: Recent Labs  Lab 12/17/21 0802 12/17/21 1144 12/17/21 1758 12/17/21 2117 12/18/21 0832  GLUCAP 86 95 102* 188* 79   Lipid Profile: No results for input(s): "CHOL", "HDL", "LDLCALC", "TRIG", "CHOLHDL", "LDLDIRECT" in the last 72 hours. Thyroid Function Tests: No results for input(s): "TSH", "T4TOTAL", "FREET4", "T3FREE", "THYROIDAB" in the last 72 hours. Anemia Panel: No results for input(s): "VITAMINB12", "FOLATE", "FERRITIN", "TIBC", "IRON", "RETICCTPCT" in the last 72 hours. Sepsis Labs: Recent Labs  Lab 12/12/21 0546  PROCALCITON 0.82  LATICACIDVEN 1.4    Recent Results (from the past 240 hour(s))  Urine Culture     Status: Abnormal   Collection Time: 12/10/21  9:00 PM   Specimen: Urine, Clean Catch  Result Value Ref Range Status   Specimen Description   Final    URINE, CLEAN CATCH Performed at [facility], [address] [place], [place] [address]    Special Requests   Final    NONE Performed at [facility], [address], [place], [place] [address]    Culture (A)  Final    <10,000 COLONIES/mL INSIGNIFICANT GROWTH Performed at [facility] [address] [place], [place] [address]    Report Status 12/12/2021 FINAL  Final  Resp Panel by RT-PCR (Flu A&B, Covid) Anterior Nasal Swab     Status: None   Collection Time: 12/12/21  4:28 AM   Specimen: Anterior Nasal Swab  Result Value Ref Range Status   SARS Coronavirus 2 by RT PCR NEGATIVE NEGATIVE Final    Comment: (NOTE) SARS-CoV-2 target nucleic acids are NOT DETECTED.  The SARS-CoV-2 RNA is generally detectable in upper respiratory specimens during the acute phase of infection. The lowest concentration of SARS-CoV-2 viral copies this assay can detect is 138 copies/mL. A negative result does not preclude SARS-Cov-2 infection  and should not be used as the sole basis for treatment or other patient management decisions. A negative result may occur with  improper specimen collection/handling, submission of specimen other than nasopharyngeal swab, presence of viral mutation(s) within the areas targeted by this assay, and inadequate number of viral copies(<138 copies/mL). A negative result must be combined with clinical observations, patient history, and epidemiological information. The expected result is Negative.  Fact Sheet for Patients:  EntrepreneurPulse.com.au  Fact Sheet for Healthcare Providers:  IncredibleEmployment.be  This test is no t yet approved or cleared by the [place] FDA and  has been authorized for detection and/or diagnosis of SARS-CoV-2 by FDA under an Emergency Use Authorization (EUA). This EUA will remain  in effect (meaning this test can be used) for the duration of the COVID-19 declaration under Section 564(b)(1) of  the Act, 21 U.S.C.section 360bbb-3(b)(1), unless the authorization is terminated  or revoked sooner.       Influenza A by PCR NEGATIVE NEGATIVE Final   Influenza B by PCR NEGATIVE NEGATIVE Final    Comment: (NOTE) The Xpert Xpress SARS-CoV-2/FLU/RSV plus assay is intended as an aid in the diagnosis of influenza from Nasopharyngeal swab specimens and should not be used as a sole basis for treatment. Nasal washings and aspirates are unacceptable for Xpert Xpress SARS-CoV-2/FLU/RSV testing.  Fact Sheet for Patients: EntrepreneurPulse.com.au  Fact Sheet for Healthcare Providers: IncredibleEmployment.be  This test is not yet approved or cleared by the [place] FDA and has been authorized for detection and/or diagnosis of SARS-CoV-2 by FDA under an Emergency Use Authorization (EUA). This EUA will remain in effect (meaning this test can be used) for the duration of the COVID-19 declaration  under Section 564(b)(1) of the Act, 21 U.S.C. section 360bbb-3(b)(1), unless the authorization is terminated or revoked.  Performed at [facility], [address] [place], [place] [address]   Gastrointestinal Panel by PCR , Stool     Status: None   Collection Time: 12/12/21  8:26 AM   Specimen: Stool  Result Value Ref Range Status   Campylobacter species NOT DETECTED NOT DETECTED Final   Plesimonas shigelloides NOT DETECTED NOT DETECTED Final   Salmonella species NOT DETECTED NOT DETECTED Final   Yersinia enterocolitica NOT DETECTED NOT DETECTED Final   Vibrio species NOT DETECTED NOT DETECTED Final   Vibrio cholerae NOT DETECTED NOT DETECTED Final   Enteroaggregative E coli (EAEC) NOT DETECTED NOT DETECTED Final   Enteropathogenic E coli (EPEC) NOT DETECTED NOT DETECTED Final   Enterotoxigenic E coli (ETEC) NOT DETECTED NOT DETECTED Final   Shiga like toxin producing E coli (STEC) NOT DETECTED NOT DETECTED Final   Shigella/Enteroinvasive E coli (EIEC) NOT DETECTED NOT DETECTED Final   Cryptosporidium NOT DETECTED NOT DETECTED Final   Cyclospora cayetanensis NOT DETECTED NOT DETECTED Final   Entamoeba histolytica NOT DETECTED NOT DETECTED Final   Giardia lamblia NOT DETECTED NOT DETECTED Final   Adenovirus F40/41 NOT DETECTED NOT DETECTED Final   Astrovirus NOT DETECTED NOT DETECTED Final   Norovirus GI/GII NOT DETECTED NOT DETECTED Final   Rotavirus A NOT DETECTED NOT DETECTED Final   Sapovirus (I, II, IV, and V) NOT DETECTED NOT DETECTED Final    Comment: Performed at [facility], [address] [place], [place] [address]  C Difficile Quick Screen w PCR reflex     Status: Abnormal   Collection Time: 12/12/21  8:26 AM   Specimen: STOOL  Result Value Ref Range Status   C Diff antigen POSITIVE (A) NEGATIVE Final   C Diff toxin NEGATIVE NEGATIVE Final   C Diff interpretation Results are indeterminate. See PCR results.  Final    Comment: Performed at  [facility], [address] [place], [place] [address]  C. Diff by PCR, Reflexed     Status: None   Collection Time: 12/12/21  8:26 AM  Result Value Ref Range Status   Toxigenic C. Difficile by PCR NEGATIVE NEGATIVE Final    Comment: Patient is colonized with non toxigenic C. difficile. May not need treatment unless significant symptoms are present. Performed at [facility], [address] [place], [place] [address]   Blood Culture (routine x 2)     Status: None   Collection Time: 12/12/21 10:16 PM   Specimen: BLOOD LEFT FOREARM  Result Value Ref Range  Status   Specimen Description BLOOD LEFT FOREARM  Final   Special Requests   Final    BOTTLES DRAWN AEROBIC AND ANAEROBIC Blood Culture adequate volume   Culture   Final    NO GROWTH 5 DAYS Performed at [facility], [address] [place], [place] [address]    Report Status 12/17/2021 FINAL  Final  Blood Culture (routine x 2)     Status: None   Collection Time: 12/12/21 10:27 PM   Specimen: BLOOD  Result Value Ref Range Status   Specimen Description BLOOD LEFT WRIST  Final   Special Requests   Final    BOTTLES DRAWN AEROBIC AND ANAEROBIC Blood Culture adequate volume   Culture   Final    NO GROWTH 5 DAYS Performed at [facility], [address], [place], [place] [address]    Report Status 12/17/2021 FINAL  Final         Radiology Studies: EEG adult  Result Date: 2021/12/25 Lora Havens, MD     12/25/21  1:41 PM Patient Name: Tonya Myers MRN: [id number] Epilepsy Attending: Lora Havens Referring Physician/Provider: Athena Masse, MD Date: 2021/12/25 Duration: 25.02 mins Patient history: 56 year old female with syncope.  EEG to evaluate for seizure. Level of alertness: Awake, asleep AEDs during EEG study: None Technical aspects: This EEG study was done with scalp electrodes positioned according to the 10-20 International system of electrode placement. Electrical  activity was reviewed with band pass filter of 1-'70Hz'$ , sensitivity of 7 uV/mm, display speed of 22m/sec with a '60Hz'$  notched filter applied as appropriate. EEG data were recorded continuously and digitally stored.  Video monitoring was available and reviewed as appropriate. Description: The posterior dominant rhythm consists of 8 Hz activity of moderate voltage (25-35 uV) seen predominantly in posterior head regions, symmetric and reactive to eye opening and eye closing. Sleep was characterized by vertex waves, sleep spindles (12 to 14 Hz), maximal frontocentral region. Physiologic photic driving was not seen during photic stimulation.  Hyperventilation was not performed.   IMPRESSION: This study is within normal limits. No seizures or epileptiform discharges were seen throughout the recording. A normal interictal EEG does not exclude nor support the diagnosis of epilepsy. Priyanka OBarbra Sarks       Scheduled Meds:  (feeding supplement) PROSource Plus  30 mL Oral TID BM   vitamin C  500 mg Oral BID   aspirin EC  81 mg Oral Daily   atorvastatin  20 mg Oral Daily   Chlorhexidine Gluconate Cloth  6 each Topical Daily   feeding supplement  1 Container Oral TID BM   gabapentin  100 mg Oral Q M,W,F-1800   heparin  5,000 Units Subcutaneous Q8H   insulin aspart  0-5 Units Subcutaneous QHS   insulin aspart  0-9 Units Subcutaneous TID WC   lacosamide  100 mg Oral BID   lacosamide  50 mg Oral Q M,W,F-HD   leptospermum manuka honey  1 Application Topical Daily   levothyroxine  125 mcg Oral Q0600   lipase/protease/amylase  12,000 Units Oral TID WC   losartan  50 mg Oral Daily   multivitamin  1 tablet Oral QHS   polyethylene glycol  17 g Oral BID   senna-docusate  2 tablet Oral QHS   sevelamer carbonate  800 mg Oral TID WC   valACYclovir  500 mg Oral Daily   zinc sulfate  220 mg Oral Daily   Continuous Infusions:     LOS: 6  days     Sidney Ace, MD [facility]   If 7PM-7AM,  please contact night-coverage  12/18/2021, 11:31 AM

## 2021-12-18 NOTE — Progress Notes (Signed)
Occupational Therapy Treatment Patient Details Name: Tonya Myers MRN: [id number] DOB: 08-May-1965 Today's Date: 12/18/2021   History of present illness 56 y.o. 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   OT comments  Upon entering the room, pt supine in bed and agreeable to OT intervention. Pt is very motivated to return to PLOF and reports, " I know I am weak but I have to work on it". Pt performs bed mobility without physical assistance but increased time to EOB. Pt stands with max A from EOB and takes several side steps to the L with RW and mod A. Pt takes seated rest break and performs 3 sets of 10 chest presses with towel pulled tightly. OT demonstrates and pt performs 10 sets of B LE hip flexion and knee extension exercises while maintaining sitting balance at supervision level. Pt stands once more in same manner as above and is able to maintain standing for ~ 3 minutes before returning to supine. Pt continues to benefit from OT intervention with recommendation for SNF to continue to address functional deficits.    Recommendations for follow up therapy are one component of a multi-disciplinary discharge planning process, led by the attending physician.  Recommendations may be updated based on patient status, additional functional criteria and insurance authorization.    Follow Up Recommendations  Skilled nursing-short term rehab (<3 hours/day)    Assistance Recommended at Discharge Frequent or constant Supervision/Assistance  Patient can return home with the following  A lot of help with walking and/or  transfers;A lot of help with bathing/dressing/bathroom;Assistance with cooking/housework;Assist for transportation   Equipment Recommendations  Other (comment) (defer to next venue of care)       Precautions / Restrictions Precautions Precautions: Fall Precaution Comments: sacral wound Restrictions Weight Bearing Restrictions: No       Mobility Bed Mobility Overal bed mobility: Needs Assistance Bed Mobility: Supine to Sit, Sit to Supine Rolling: Supervision   Supine to sit: Min guard          Transfers Overall transfer level: Needs assistance Equipment used: Rolling walker (2 wheels) Transfers: Sit to/from Stand Sit to Stand: Max assist                 Balance Overall balance assessment: Needs assistance Sitting-balance support: Feet supported, Bilateral upper extremity supported Sitting balance-Leahy Scale: Good Sitting balance - Comments: supervision   Standing balance support: Reliant on assistive device for balance, During functional activity, Bilateral upper extremity supported Standing balance-Leahy Scale: Poor                             ADL either performed or assessed with clinical judgement     Vision Patient Visual Report: No change from baseline            Cognition Arousal/Alertness: Awake/alert Behavior During Therapy: WFL for tasks assessed/performed Overall Cognitive Status: Within Functional Limits for tasks assessed  Pertinent Vitals/ Pain       Pain Assessment Pain Assessment: Faces Faces Pain Scale: Hurts even more Pain Location: pain in feet Pain Descriptors / Indicators: Aching, Discomfort Pain Intervention(s): Monitored during session, Repositioned  Home Living     Available Help at Discharge: Family                                        Frequency  Min 2X/week        Progress Toward Goals  OT Goals(current  goals can now be found in the care plan section)  Progress towards OT goals: Progressing toward goals  Acute Rehab OT Goals Patient Stated Goal: to return to [facility] and go to rehab OT Goal Formulation: With patient Time For Goal Achievement: 12/29/21 Potential to Achieve Goals: [place] Discharge plan remains appropriate;Frequency remains appropriate       AM-PAC OT "6 Clicks" Daily Activity     Outcome Measure   Help from another person eating meals?: A Little Help from another person taking care of personal grooming?: A Little Help from another person toileting, which includes using toliet, bedpan, or urinal?: A Lot Help from another person bathing (including washing, rinsing, drying)?: A Lot Help from another person to put on and taking off regular upper body clothing?: A Little Help from another person to put on and taking off regular lower body clothing?: A Lot 6 Click Score: 15    End of Session Equipment Utilized During Treatment: Rolling walker (2 wheels)  OT Visit Diagnosis: Unsteadiness on feet (R26.81);Muscle weakness (generalized) (M62.81)   Activity Tolerance Patient tolerated treatment well   Patient Left in bed;with call bell/phone within reach;with bed alarm set;with family/visitor present   Nurse Communication Mobility status;Other (comment) (pt and family with medication questions)        Time: 9242-6834 OT Time Calculation (min): 29 min  Charges: OT General Charges $OT Visit: 1 Visit OT Treatments $Therapeutic Activity: 8-22 mins $Therapeutic Exercise: 8-22 mins  Darleen Crocker, MS, OTR/L , CBIS ascom [phone]  12/18/21, 3:18 PM

## 2021-12-19 DIAGNOSIS — R197 Diarrhea, unspecified: Secondary | ICD-10-CM | POA: Diagnosis not present

## 2021-12-19 DIAGNOSIS — R55 Syncope and collapse: Secondary | ICD-10-CM | POA: Diagnosis not present

## 2021-12-19 DIAGNOSIS — N186 End stage renal disease: Secondary | ICD-10-CM | POA: Diagnosis not present

## 2021-12-19 DIAGNOSIS — D631 Anemia in chronic kidney disease: Secondary | ICD-10-CM | POA: Diagnosis not present

## 2021-12-19 DIAGNOSIS — R531 Weakness: Secondary | ICD-10-CM | POA: Diagnosis not present

## 2021-12-19 LAB — GLUCOSE, CAPILLARY
Glucose-Capillary: 82 mg/dL (ref 70–99)
Glucose-Capillary: 74 mg/dL (ref 70–99)
Glucose-Capillary: 120 mg/dL — ABNORMAL HIGH (ref 70–99)
Glucose-Capillary: 190 mg/dL — ABNORMAL HIGH (ref 70–99)

## 2021-12-19 NOTE — Progress Notes (Signed)
[facility]  ROUNDING NOTE   Subjective:   Tonya Myers is a 56 year old female with past medical history including depression, type 2 diabetes, asthma, hypertension, hypothyroidism, CLL, and end-stage renal disease on hemodialysis.   Patient was seen today on second floor Patient resting comfortably in the bed Patient offers no new specific physical complaints Patient family was present at the bedside as well Patient family also had no new specific concerns   Objective:  Vital signs in last 24 hours:  Temp:  [97.8 F (36.6 C)-98.8 F (37.1 C)] 97.9 F (36.6 C) (08/26 0737) Pulse Rate:  [60-87] 60 (08/26 0737) Resp:  [15-19] 18 (08/26 0737) BP: (114-164)/(67-96) 144/92 (08/26 0737) SpO2:  [98 %-100 %] 100 % (08/26 0737) Weight:  [74.7 kg] 74.7 kg (08/25 2100)  Weight change: 4.8 kg Filed Weights   12/17/21 1800 12/18/21 0443 12/18/21 2100  Weight: 69.9 kg 74.7 kg 74.7 kg     Intake/Output: I/O last 3 completed shifts: In: 600 [P.O.:600] Out: 2000 [Other:2000]   Intake/Output this shift:  No intake/output data recorded.  Physical Exam: General: NAD  Head: Normocephalic, atraumatic. Moist oral mucosal membranes  Eyes: Anicteric  Lungs:  Clear to auscultation, normal effort  Heart: Regular rate and rhythm  Abdomen:  Soft, nontender  Extremities:  No peripheral edema.  Neurologic: Alert, moving upper extremities  Skin: No lesions  Access: Rt Permcath    Basic Metabolic Panel: Recent Labs  Lab 12/13/21 0630 12/14/21 0637 12/15/21 0618 12/16/21 0854  NA 141 141 139 141  K 3.9 4.2 4.4 5.1  CL 106 106 104 105  CO2 '25 23 26 23  '$ GLUCOSE 78 100* 78 79  BUN 32* 47* 30* 48*  CREATININE 4.77* 5.96* 3.53* 4.86*  CALCIUM 9.0 9.1 8.9 9.3    Liver Function Tests: No results for input(s): "AST", "ALT", "ALKPHOS", "BILITOT", "PROT", "ALBUMIN" in the last 168 hours. Recent Labs  Lab 12/13/21 0630  LIPASE 77*   No results for input(s):  "AMMONIA" in the last 168 hours.  CBC: Recent Labs  Lab 12/13/21 0630 12/14/21 0637 12/15/21 0618 12/16/21 0854  WBC 3.2* 3.7* 3.3* 3.5*  NEUTROABS  --  1.6* 1.3* 1.3*  HGB 8.0* 8.0* 7.8* 7.9*  HCT 25.8* 25.2* 24.0* 25.4*  MCV 95.9 95.8 94.9 94.4  PLT 169 172 PLATELET CLUMPS NOTED ON SMEAR, UNABLE TO ESTIMATE 176    Cardiac Enzymes: No results for input(s): "CKTOTAL", "CKMB", "CKMBINDEX", "TROPONINI" in the last 168 hours.  BNP: Invalid input(s): "POCBNP"  CBG: Recent Labs  Lab 12/18/21 0832 12/18/21 1224 12/18/21 1817 12/18/21 2052 12/19/21 0737  GLUCAP 79 85 123* 129* 82    Microbiology: Results for orders placed or performed during the hospital encounter of 12/12/21  Resp Panel by RT-PCR (Flu A&B, Covid) Anterior Nasal Swab     Status: None   Collection Time: 12/12/21  4:28 AM   Specimen: Anterior Nasal Swab  Result Value Ref Range Status   SARS Coronavirus 2 by RT PCR NEGATIVE NEGATIVE Final    Comment: (NOTE) SARS-CoV-2 target nucleic acids are NOT DETECTED.  The SARS-CoV-2 RNA is generally detectable in upper respiratory specimens during the acute phase of infection. The lowest concentration of SARS-CoV-2 viral copies this assay can detect is 138 copies/mL. A negative result does not preclude SARS-Cov-2 infection and should not be used as the sole basis for treatment or other patient management decisions. A negative result may occur with  improper specimen collection/handling, submission of specimen other than  nasopharyngeal swab, presence of viral mutation(s) within the areas targeted by this assay, and inadequate number of viral copies(<138 copies/mL). A negative result must be combined with clinical observations, patient history, and epidemiological information. The expected result is Negative.  Fact Sheet for Patients:  EntrepreneurPulse.com.au  Fact Sheet for Healthcare Providers:   IncredibleEmployment.be  This test is no t yet approved or cleared by the [place] FDA and  has been authorized for detection and/or diagnosis of SARS-CoV-2 by FDA under an Emergency Use Authorization (EUA). This EUA will remain  in effect (meaning this test can be used) for the duration of the COVID-19 declaration under Section 564(b)(1) of the Act, 21 U.S.C.section 360bbb-3(b)(1), unless the authorization is terminated  or revoked sooner.       Influenza A by PCR NEGATIVE NEGATIVE Final   Influenza B by PCR NEGATIVE NEGATIVE Final    Comment: (NOTE) The Xpert Xpress SARS-CoV-2/FLU/RSV plus assay is intended as an aid in the diagnosis of influenza from Nasopharyngeal swab specimens and should not be used as a sole basis for treatment. Nasal washings and aspirates are unacceptable for Xpert Xpress SARS-CoV-2/FLU/RSV testing.  Fact Sheet for Patients: EntrepreneurPulse.com.au  Fact Sheet for Healthcare Providers: IncredibleEmployment.be  This test is not yet approved or cleared by the [place] FDA and has been authorized for detection and/or diagnosis of SARS-CoV-2 by FDA under an Emergency Use Authorization (EUA). This EUA will remain in effect (meaning this test can be used) for the duration of the COVID-19 declaration under Section 564(b)(1) of the Act, 21 U.S.C. section 360bbb-3(b)(1), unless the authorization is terminated or revoked.  Performed at [facility], [address] [place], [place] [address]   Gastrointestinal Panel by PCR , Stool     Status: None   Collection Time: 12/12/21  8:26 AM   Specimen: Stool  Result Value Ref Range Status   Campylobacter species NOT DETECTED NOT DETECTED Final   Plesimonas shigelloides NOT DETECTED NOT DETECTED Final   Salmonella species NOT DETECTED NOT DETECTED Final   Yersinia enterocolitica NOT DETECTED NOT DETECTED Final   Vibrio species NOT  DETECTED NOT DETECTED Final   Vibrio cholerae NOT DETECTED NOT DETECTED Final   Enteroaggregative E coli (EAEC) NOT DETECTED NOT DETECTED Final   Enteropathogenic E coli (EPEC) NOT DETECTED NOT DETECTED Final   Enterotoxigenic E coli (ETEC) NOT DETECTED NOT DETECTED Final   Shiga like toxin producing E coli (STEC) NOT DETECTED NOT DETECTED Final   Shigella/Enteroinvasive E coli (EIEC) NOT DETECTED NOT DETECTED Final   Cryptosporidium NOT DETECTED NOT DETECTED Final   Cyclospora cayetanensis NOT DETECTED NOT DETECTED Final   Entamoeba histolytica NOT DETECTED NOT DETECTED Final   Giardia lamblia NOT DETECTED NOT DETECTED Final   Adenovirus F40/41 NOT DETECTED NOT DETECTED Final   Astrovirus NOT DETECTED NOT DETECTED Final   Norovirus GI/GII NOT DETECTED NOT DETECTED Final   Rotavirus A NOT DETECTED NOT DETECTED Final   Sapovirus (I, II, IV, and V) NOT DETECTED NOT DETECTED Final    Comment: Performed at [facility], [address] [place], [place] [address]  C Difficile Quick Screen w PCR reflex     Status: Abnormal   Collection Time: 12/12/21  8:26 AM   Specimen: STOOL  Result Value Ref Range Status   C Diff antigen POSITIVE (A) NEGATIVE Final   C Diff toxin NEGATIVE NEGATIVE Final   C Diff interpretation Results are indeterminate. See PCR results.  Final    Comment:  Performed at [facility], [address] [place], [place] [address]  C. Diff by PCR, Reflexed     Status: None   Collection Time: 12/12/21  8:26 AM  Result Value Ref Range Status   Toxigenic C. Difficile by PCR NEGATIVE NEGATIVE Final    Comment: Patient is colonized with non toxigenic C. difficile. May not need treatment unless significant symptoms are present. Performed at [facility], [address] [place], [place] [address]   Blood Culture (routine x 2)     Status: None   Collection Time: 12/12/21 10:16 PM   Specimen: BLOOD LEFT FOREARM  Result Value Ref Range Status   Specimen  Description BLOOD LEFT FOREARM  Final   Special Requests   Final    BOTTLES DRAWN AEROBIC AND ANAEROBIC Blood Culture adequate volume   Culture   Final    NO GROWTH 5 DAYS Performed at [facility], [address] [place], [place] [address]    Report Status 12/17/2021 FINAL  Final  Blood Culture (routine x 2)     Status: None   Collection Time: 12/12/21 10:27 PM   Specimen: BLOOD  Result Value Ref Range Status   Specimen Description BLOOD LEFT WRIST  Final   Special Requests   Final    BOTTLES DRAWN AEROBIC AND ANAEROBIC Blood Culture adequate volume   Culture   Final    NO GROWTH 5 DAYS Performed at [facility], [address], [place], [place] [address]    Report Status 12/17/2021 FINAL  Final    Coagulation Studies: No results for input(s): "LABPROT", "INR" in the last 72 hours.   Urinalysis: No results for input(s): "COLORURINE", "LABSPEC", "PHURINE", "GLUCOSEU", "HGBUR", "BILIRUBINUR", "KETONESUR", "PROTEINUR", "UROBILINOGEN", "NITRITE", "LEUKOCYTESUR" in the last 72 hours.  Invalid input(s): "APPERANCEUR"     Imaging: No results found.   Medications:       (feeding supplement) PROSource Plus  30 mL Oral TID BM   vitamin C  500 mg Oral BID   aspirin EC  81 mg Oral Daily   atorvastatin  20 mg Oral Daily   Chlorhexidine Gluconate Cloth  6 each Topical Daily   epoetin (EPOGEN/PROCRIT) injection  4,000 Units Intravenous Q M,W,F-HD   feeding supplement  1 Container Oral TID BM   gabapentin  100 mg Oral Q M,W,F-1800   heparin  5,000 Units Subcutaneous Q8H   insulin aspart  0-5 Units Subcutaneous QHS   insulin aspart  0-9 Units Subcutaneous TID WC   lacosamide  100 mg Oral BID   lacosamide  50 mg Oral Q M,W,F-HD   leptospermum manuka honey  1 Application Topical Daily   levothyroxine  125 mcg Oral Q0600   lipase/protease/amylase  12,000 Units Oral TID WC   losartan  50 mg Oral Daily   multivitamin  1 tablet Oral QHS   polyethylene glycol   17 g Oral BID   senna-docusate  2 tablet Oral QHS   sevelamer carbonate  800 mg Oral TID WC   valACYclovir  500 mg Oral Daily   zinc sulfate  220 mg Oral Daily   acetaminophen, albuterol, dextromethorphan-guaiFENesin, diclofenac Sodium, fluticasone, hydrALAZINE, iohexol, ondansetron (ZOFRAN) IV, oxyCODONE-acetaminophen  Assessment/ Plan:  Tonya Myers is a 55 y.o.  female with past medical history including depression, type 2 diabetes, asthma, hypertension, hypothyroidism, CLL, and end-stage renal disease on hemodialysis.  [facility]/MWF/right PermCath/66.4kg  End-stage renal disease on hemodialysis.   Patient was last dialyzed yesterday No  need for renal placement therapy today  Next treatment scheduled for Monday. Patient agreeable to rehab placement, requesting placement closer to [facility].  Renal navigator aware and is currently seeking outpatient transfer.  2. Anemia of chronic kidney disease Lab Results  Component Value Date   HGB 7.9 (L) 12/16/2021  Patient receives [place] outpatient.  Will order low-dose EPO with treatments.  3. Secondary Hyperparathyroidism: with outpatient labs: PTH 235, phosphorus 6.7, calcium 9.0 on 11/30/2021.   Lab Results  Component Value Date   CALCIUM 9.3 12/16/2021   PHOS 3.2 11/28/2020    Calcium and phosphorus within acceptable range.  4. 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 Home regimen includes lispro. Most recent hemoglobin A1c is 4.9 on 12/09/2021.   Primary team will manage Sliding Scale insulin.   5.  Syncopal episode during dialysis treatment.  Cardiology consulted due to elevated troponins overnight.  Echo shows EF 55 to 60%.  Home cardiac monitor remains in place from previous admission.    6.  Nausea/sacral wound/retained stool/C. difficile positive. Management as per internal medicine team.  Currently has received aztreonam, cefepime, metronidazole and  vancomycin IV -Fecal impaction resolved -General surgery consulted and will defer I&D.  - WOC following    LOS: 7 Alton Bouknight s Kandi Brusseau 8/26/20239:34 AM

## 2021-12-19 NOTE — TOC Progression Note (Signed)
Transition of Care [facility]) - Progression Note    Patient Details  Name: Tonya Myers MRN: [id number] Date of Birth: 1966-04-24  [facility]) [name] Contact  Izola Price, RN Phone Number: 12/19/2021, 2:14 PM  Clinical Narrative:  8/26: [name] at [employer]. She had not reviewed patient yet. Will call CM back. [name] RN CM      Expected Discharge Plan: Skilled Nursing Facility Barriers to Discharge: Continued Medical Work up, SNF Pending bed offer  Expected Discharge Plan and Services Expected Discharge Plan: [place] Choice: [place] arrangements for the past 2 months: Single Family Home                                       Social Determinants of Health (SDOH) Interventions    Readmission Risk Interventions    11/24/2020    2:17 PM  Readmission Risk Prevention Plan  Transportation Screening Complete  PCP or Specialist Appt within 3-5 Days Complete  HRI or [place] Complete  Social Work Consult for [place] Planning/Counseling Complete  Palliative Care Screening Not Applicable  Medication Review [employer]) Complete

## 2021-12-19 NOTE — Progress Notes (Signed)
PROGRESS NOTE    Tonya Myers  OIZ:[id number] DOB: 01-Sep-1965 DOA: 12/12/2021 PCP: [name], MD    Brief Narrative:  56 y.o. 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    Pt was recently hospitalized from 8/16 - 8/18 due to syncope.  Per report, dialysis staff performed 2 minutes of CPR prior to arrival to Ed. Pt had elevated trop up to  452.  Cardiology was consulted, suspected that her syncopal episode was vasovagal/hypotensive in nature, and troponin elevation was demand ischemia in that setting. 2 D echo on 8/17 showed LVEF of 55-60% without WMA's or other valvular pathologies. Pt also has had UTI and was started on Cipro at the discharge.    Patient states that after she went home, she has worsening weakness.  She has fever and chills.  She continues to have symptoms of UTI, including dysuria, burning on urination and urinary frequency.  She also reports nausea, diarrhea and mild lower abdominal pain.  No vomiting.  Patient has had 5 times of diarrhea.   C. difficile assay negative.  Antigen positive toxin negative.  Diarrhea has resolved.  No clinical indications of acute C. difficile infection.  Sacral decubitus ulcer has been evaluated by surgery.  No surgical intervention warranted.  Nephrology following for inpatient hemodialysis needs.  8/23: Rapid response called last night due to decreased level of responsiveness and bilateral lower extremity weakness.  Code stroke called.  CT head negative.  Patient offered MRI but refused.  Stated she was simply tired.  When evaluated the following morning no focal  neurologic deficit noted however patient is noted to have slow responses and be somewhat tremulous.  Discussed with nephrology.  Possible effect from pregabalin.  Dose decreased to renal dose of 75 mg nightly.  Patient requested dc of lyrica and attempt at gabapentin.  Seems to be clinically improved on 8/25   Assessment & Plan:   Principal Problem:   Diarrhea Active Problems:   Stercoral colitis   Sacral wound   UTI (urinary tract infection)   End-stage renal disease on hemodialysis (HCC)   Essential hypertension   Hypothyroidism   Dyslipidemia   Myocardial injury   Type II diabetes mellitus with renal manifestations (HCC)   Anemia in ESRD (end-stage renal disease) (HCC)   Chronic pancreatitis (HCC)   Asthma   Closed fracture of sternum   Transient neurologic deficit 12/15/21   History of herpes genitalis   Malnutrition of moderate degree  Decreased LOC  weakness Tremulousness Rapid response called 8/22 for decreased level responsiveness and weakness.  Also associated with decreased grip strength.  Unclear etiology.  CT head negative.  Code stroke called.  MRI was offered but patient declined.  Stating fatigue.  The following morning the patient is neurologically intact however is continuing to endorse weakness and has some tremulous this on exam.  Delayed response time. Suspect medication effect secondary to Lyrica Plan: Low suspicion for acute CVA Continue HD dosing gabapentin Mental status peers to be improved   Stercoral colitis Diarrhea Patient presented with diarrhea in the setting of fecal impaction resulting in stercoral colitis.  Bowel regimen with disimpaction was effective and now patient is having  bowel movements. Patient is now completed 5 days of cefepime and Flagyl Plan: No further antibiotics   Stage IV sacral decubitus ulcer Seen and evaluated by general surgery.  No indication for surgical intervention at this time Wound care consult  appreciated Plan: No further antibiotics Wound care Therapy as tolerated Need skilled nursing facility [facility] aware looking for options  Possible urinary tract infection Patient was discharged on ciprofloxacin 1 day prior to presentation Still has typical symptoms of UTI Has completed empiric course of treatment No further antibiotics  ESRD on HD Nephrology following for inpatient HD needs HD 8/23, HD 8/25 Otherwise medically ready for discharge  Essential hypertension Poor control Continue PTA Cozaar Scheduled hydralazine PRN IV hydralazine  Hypothyroidism PTA Synthroid  Elevated troponins No significant delta Low suspicion for ACS  Type II diabetes mellitus with renal manifestations ([place]) Sliding scale Last A1c 4.9, well controlled  Anemia of chronic kidney disease Hemoglobin stable  Chronic pancreatitis Normal LFTs and lipase Creon started this admission  History of asthma Stable, unclear severity P.o. and bronchodilators  Closed fracture of sternum No indication for surgical intervention Multimodal pain control  History of herpes genitalis PTA suppressive valacyclovir    DVT prophylaxis: SQ heparin Code Status: Full Family Communication: [name] [phone] on 8/22 Disposition Plan: Status is: Inpatient Remains inpatient appropriate because: Patient medically stable and can discharge to skilled nursing facility once we have an available bed   Level of care: Progressive  Consultants:  Nephrology  Procedures:  None  Antimicrobials:  Subjective: Patient seen and examined.  Endorses some weakness but improved from prior  Objective: Vitals:   12/18/21 2333 12/19/21 0442 12/19/21 0737 12/19/21 1203  BP: 133/81 114/67 (!) 144/92 (!) 166/78  Pulse: 70 63 60 (!) 58  Resp: '16 18 18 18  '$ Temp: 98.6 F (37 C) 98.4 F (36.9 C) 97.9 F (36.6 C) 98.2 F (36.8 C)  TempSrc:      SpO2: 100% 100% 100% 100%  Weight:      Height:         Intake/Output Summary (Last 24 hours) at 12/19/2021 1215 Last data filed at 12/18/2021 2200 Gross per 24 hour  Intake 360 ml  Output 2000 ml  Net -1640 ml   Filed Weights   12/17/21 1800 12/18/21 0443 12/18/21 2100  Weight: 69.9 kg 74.7 kg 74.7 kg    Examination:  General exam: No acute distress Respiratory system: Lungs clear.  Normal work of breathing.  Room air Cardiovascular system: S2, RRR, no murmurs, no pedal edema Gastrointestinal system: Soft, NT/ND, normal bowel sounds Central nervous system: Alert and oriented.  No focal deficits.  No tremors Extremities: Decreased power BLE Skin: Stage IV sacral decubitus Psychiatry: Judgement and insight appear normal. Mood & affect appropriate.     Data Reviewed: I have personally reviewed following labs and imaging studies  CBC: Recent Labs  Lab 12/13/21 0630 12/14/21 0637 12/15/21 0618 12/16/21 0854  WBC 3.2* 3.7* 3.3* 3.5*  NEUTROABS  --  1.6* 1.3* 1.3*  HGB 8.0* 8.0* 7.8* 7.9*  HCT 25.8* 25.2* 24.0* 25.4*  MCV 95.9 95.8 94.9 94.4  PLT 169 172 PLATELET CLUMPS NOTED ON SMEAR, UNABLE TO ESTIMATE 211   Basic Metabolic Panel: Recent Labs  Lab 12/13/21 0630 12/14/21 0637 12/15/21 0618 12/16/21 0854  NA 141 141 139 141  K 3.9 4.2 4.4 5.1  CL 106 106 104 105  CO2 '25 23 26 23  '$ GLUCOSE 78 100* 78 79  BUN 32*  47* 30* 48*  CREATININE 4.77* 5.96* 3.53* 4.86*  CALCIUM 9.0 9.1 8.9 9.3   GFR: Estimated Creatinine Clearance: 14.6 mL/min (A) (by C-G formula based on SCr of 4.86 mg/dL (H)). Liver Function Tests: No results for input(s): "AST", "ALT", "ALKPHOS", "BILITOT", "PROT", "ALBUMIN" in the last 168 hours.  Recent Labs  Lab 12/13/21 0630  LIPASE 77*   No results for input(s): "AMMONIA" in the last 168 hours. Coagulation Profile: No results for input(s): "INR", "PROTIME" in the last 168 hours.  Cardiac Enzymes: No results for input(s): "CKTOTAL", "CKMB", "CKMBINDEX", "TROPONINI" in the last 168  hours. BNP (last 3 results) No results for input(s): "PROBNP" in the last 8760 hours. HbA1C: No results for input(s): "HGBA1C" in the last 72 hours. CBG: Recent Labs  Lab 12/18/21 1224 12/18/21 1817 12/18/21 2052 12/19/21 0737 12/19/21 1205  GLUCAP 85 123* 129* 82 74   Lipid Profile: No results for input(s): "CHOL", "HDL", "LDLCALC", "TRIG", "CHOLHDL", "LDLDIRECT" in the last 72 hours. Thyroid Function Tests: No results for input(s): "TSH", "T4TOTAL", "FREET4", "T3FREE", "THYROIDAB" in the last 72 hours. Anemia Panel: No results for input(s): "VITAMINB12", "FOLATE", "FERRITIN", "TIBC", "IRON", "RETICCTPCT" in the last 72 hours. Sepsis Labs: No results for input(s): "PROCALCITON", "LATICACIDVEN" in the last 168 hours.   Recent Results (from the past 240 hour(s))  Urine Culture     Status: Abnormal   Collection Time: 12/10/21  9:00 PM   Specimen: Urine, Clean Catch  Result Value Ref Range Status   Specimen Description   Final    URINE, CLEAN CATCH Performed at [facility], [address] [place], [place] [address]    Special Requests   Final    NONE Performed at [facility], [address], [place], [place] [address]    Culture (A)  Final    <10,000 COLONIES/mL INSIGNIFICANT GROWTH Performed at [facility] [address] [place],  [address]    Report Status 12/12/2021 FINAL  Final  Resp Panel by RT-PCR (Flu A&B, Covid) Anterior Nasal Swab     Status: None   Collection Time: 12/12/21  4:28 AM   Specimen: Anterior Nasal Swab  Result Value Ref Range Status   SARS Coronavirus 2 by RT PCR NEGATIVE NEGATIVE Final    Comment: (NOTE) SARS-CoV-2 target nucleic acids are NOT DETECTED.  The SARS-CoV-2 RNA is generally detectable in upper respiratory specimens during the acute phase of infection. The lowest concentration of SARS-CoV-2 viral copies this assay can detect is 138 copies/mL. A negative result does not preclude  SARS-Cov-2 infection and should not be used as the sole basis for treatment or other patient management decisions. A negative result may occur with  improper specimen collection/handling, submission of specimen other than nasopharyngeal swab, presence of viral mutation(s) within the areas targeted by this assay, and inadequate number of viral copies(<138 copies/mL). A negative result must be combined with clinical observations, patient history, and epidemiological information. The expected result is Negative.  Fact Sheet for Patients:  EntrepreneurPulse.com.au  Fact Sheet for Healthcare Providers:  IncredibleEmployment.be  This test is no t yet approved or cleared by the [place] FDA and  has been authorized for detection and/or diagnosis of SARS-CoV-2 by FDA under an Emergency Use Authorization (EUA). This EUA will remain  in effect (meaning this test can be used) for the duration of the COVID-19 declaration under Section 564(b)(1) of the Act, 21 U.S.C.section 360bbb-3(b)(1), unless the authorization is terminated  or revoked sooner.  Influenza A by PCR NEGATIVE NEGATIVE Final   Influenza B by PCR NEGATIVE NEGATIVE Final    Comment: (NOTE) The Xpert Xpress SARS-CoV-2/FLU/RSV plus assay is intended as an aid in the diagnosis of influenza from Nasopharyngeal swab specimens and should not be used as a sole basis for treatment. Nasal washings and aspirates are unacceptable for Xpert Xpress SARS-CoV-2/FLU/RSV testing.  Fact Sheet for Patients: EntrepreneurPulse.com.au  Fact Sheet for Healthcare Providers: IncredibleEmployment.be  This test is not yet approved or cleared by the [place] FDA and has been authorized for detection and/or diagnosis of SARS-CoV-2 by FDA under an Emergency Use Authorization (EUA). This EUA will remain in effect (meaning this test can be used) for the duration of  the COVID-19 declaration under Section 564(b)(1) of the Act, 21 U.S.C. section 360bbb-3(b)(1), unless the authorization is terminated or revoked.  Performed at [facility], [address] [place], [place] [address]   Gastrointestinal Panel by PCR , Stool     Status: None   Collection Time: 12/12/21  8:26 AM   Specimen: Stool  Result Value Ref Range Status   Campylobacter species NOT DETECTED NOT DETECTED Final   Plesimonas shigelloides NOT DETECTED NOT DETECTED Final   Salmonella species NOT DETECTED NOT DETECTED Final   Yersinia enterocolitica NOT DETECTED NOT DETECTED Final   Vibrio species NOT DETECTED NOT DETECTED Final   Vibrio cholerae NOT DETECTED NOT DETECTED Final   Enteroaggregative E coli (EAEC) NOT DETECTED NOT DETECTED Final   Enteropathogenic E coli (EPEC) NOT DETECTED NOT DETECTED Final   Enterotoxigenic E coli (ETEC) NOT DETECTED NOT DETECTED Final   Shiga like toxin producing E coli (STEC) NOT DETECTED NOT DETECTED Final   Shigella/Enteroinvasive E coli (EIEC) NOT DETECTED NOT DETECTED Final   Cryptosporidium NOT DETECTED NOT DETECTED Final   Cyclospora cayetanensis NOT DETECTED NOT DETECTED Final   Entamoeba histolytica NOT DETECTED NOT DETECTED Final   Giardia lamblia NOT DETECTED NOT DETECTED Final   Adenovirus F40/41 NOT DETECTED NOT DETECTED Final   Astrovirus NOT DETECTED NOT DETECTED Final   Norovirus GI/GII NOT DETECTED NOT DETECTED Final   Rotavirus A NOT DETECTED NOT DETECTED Final   Sapovirus (I, II, IV, and V) NOT DETECTED NOT DETECTED Final    Comment: Performed at [facility], [address] [place], [place] [address]  C Difficile Quick Screen w PCR reflex     Status: Abnormal   Collection Time: 12/12/21  8:26 AM   Specimen: STOOL  Result Value Ref Range Status   C Diff antigen POSITIVE (A) NEGATIVE Final   C Diff toxin NEGATIVE NEGATIVE Final   C Diff interpretation Results are indeterminate. See PCR results.  Final     Comment: Performed at [facility], [address] [place], [place] [address]  C. Diff by PCR, Reflexed     Status: None   Collection Time: 12/12/21  8:26 AM  Result Value Ref Range Status   Toxigenic C. Difficile by PCR NEGATIVE NEGATIVE Final    Comment: Patient is colonized with non toxigenic C. difficile. May not need treatment unless significant symptoms are present. Performed at [facility], [address] [place], [place] [address]   Blood Culture (routine x 2)     Status: None   Collection Time: 12/12/21 10:16 PM   Specimen: BLOOD LEFT FOREARM  Result Value Ref Range Status   Specimen Description BLOOD LEFT FOREARM  Final   Special Requests   Final  BOTTLES DRAWN AEROBIC AND ANAEROBIC Blood Culture adequate volume   Culture   Final    NO GROWTH 5 DAYS Performed at [facility], [address] [place], [place] [address]    Report Status 12/17/2021 FINAL  Final  Blood Culture (routine x 2)     Status: None   Collection Time: 12/12/21 10:27 PM   Specimen: BLOOD  Result Value Ref Range Status   Specimen Description BLOOD LEFT WRIST  Final   Special Requests   Final    BOTTLES DRAWN AEROBIC AND ANAEROBIC Blood Culture adequate volume   Culture   Final    NO GROWTH 5 DAYS Performed at [facility], [address], [place], [place] [address]    Report Status 12/17/2021 FINAL  Final         Radiology Studies: No results found.      Scheduled Meds:  (feeding supplement) PROSource Plus  30 mL Oral TID BM   vitamin C  500 mg Oral BID   aspirin EC  81 mg Oral Daily   atorvastatin  20 mg Oral Daily   Chlorhexidine Gluconate Cloth  6 each Topical Daily   epoetin (EPOGEN/PROCRIT) injection  4,000 Units Intravenous Q M,W,F-HD   feeding supplement  1 Container Oral TID BM   gabapentin  100 mg Oral Q M,W,F-1800   heparin  5,000 Units Subcutaneous Q8H   insulin aspart  0-5 Units Subcutaneous QHS   insulin aspart  0-9 Units  Subcutaneous TID WC   lacosamide  100 mg Oral BID   lacosamide  50 mg Oral Q M,W,F-HD   leptospermum manuka honey  1 Application Topical Daily   levothyroxine  125 mcg Oral Q0600   lipase/protease/amylase  12,000 Units Oral TID WC   losartan  50 mg Oral Daily   multivitamin  1 tablet Oral QHS   polyethylene glycol  17 g Oral BID   senna-docusate  2 tablet Oral QHS   sevelamer carbonate  800 mg Oral TID WC   valACYclovir  500 mg Oral Daily   zinc sulfate  220 mg Oral Daily   Continuous Infusions:     LOS: 7 days     Sidney Ace, MD [facility]   If 7PM-7AM, please contact night-coverage  12/19/2021, 12:15 PM

## 2021-12-19 NOTE — Progress Notes (Signed)
Chaplain responded to pt request for visit.  Pt appears tired and discouraged, has struggled with significant losses in health and independence.  Chaplain provided space for pt to reflect on feelings of resentment, loneliness and what others are saying is her neediness. Pt feels partner, family members are taking advantage of her financially, and they do not visit her enough.  Pt is able to consider how she participates in unhealthy cycles of communication and what changes in herself she can make toward healthy limits and agreements in her relationships. Pt is also working to focus first on gratitude before problems. Faith is an important resource for her.  Chaplain provided positive, emotional support and reflective listening.  Chaplain suggested online support resources through [employer] and continued involvement in her church community.  Visit ended with prayer.    Please contact as needed for further support.  [place], [place] Pager:  [phone]    12/19/21 1600  Clinical Encounter Type  Visited With Patient  Visit Type Initial;Psychological support;Spiritual support  Referral From Patient  Consult/Referral To Chaplain  Spiritual Encounters  Spiritual Needs Prayer;Emotional  Stress Factors  Patient Stress Factors Exhausted;Family relationships;Loss of control;Health changes

## 2021-12-20 DIAGNOSIS — R531 Weakness: Secondary | ICD-10-CM | POA: Diagnosis not present

## 2021-12-20 DIAGNOSIS — R197 Diarrhea, unspecified: Secondary | ICD-10-CM | POA: Diagnosis not present

## 2021-12-20 DIAGNOSIS — R55 Syncope and collapse: Secondary | ICD-10-CM | POA: Diagnosis not present

## 2021-12-20 LAB — GLUCOSE, CAPILLARY
Glucose-Capillary: 124 mg/dL — ABNORMAL HIGH (ref 70–99)
Glucose-Capillary: 109 mg/dL — ABNORMAL HIGH (ref 70–99)
Glucose-Capillary: 160 mg/dL — ABNORMAL HIGH (ref 70–99)
Glucose-Capillary: 121 mg/dL — ABNORMAL HIGH (ref 70–99)

## 2021-12-20 MED ORDER — DICLOFENAC SODIUM 1 % EX GEL
4.0000 g | Freq: Four times a day (QID) | CUTANEOUS | Status: DC
  Administered 2021-12-20 – 2021-12-23 (×12): 4 g via TOPICAL
  Filled 2021-12-20: qty 100

## 2021-12-20 MED ORDER — LIDOCAINE 5 % EX PTCH
1.0000 | MEDICATED_PATCH | Freq: Once | CUTANEOUS | Status: AC
  Administered 2021-12-20: 1 via TRANSDERMAL
  Filled 2021-12-20: qty 1

## 2021-12-20 NOTE — Progress Notes (Signed)
       CROSS COVER NOTE  NAME: Tonya Myers MRN: [id number] DOB : Jan 10, 1966    Date of Service   12/20/2021   HPI/Events of Note   Voltaren already ordered for arthritic pain Pt wants lidocaine patch  Interventions   Plan: Lidocaine patch x1 toes X X

## 2021-12-20 NOTE — Progress Notes (Signed)
[facility]  ROUNDING NOTE   Subjective:   Tonya Myers is a 56 year old female with past medical history including depression, type 2 diabetes, asthma, hypertension, hypothyroidism, CLL, and end-stage renal disease on hemodialysis.   Patient was seen today on second floor Patient resting comfortably in the bed Patient offers no new specific physical complaints Patient family also had no new specific concerns   Objective:  Vital signs in last 24 hours:  Temp:  [97.7 F (36.5 C)-98.7 F (37.1 C)] 98.3 F (36.8 C) (08/27 0749) Pulse Rate:  [58-100] 66 (08/27 0749) Resp:  [17-18] 18 (08/27 0749) BP: (101-166)/(56-86) 132/74 (08/27 0749) SpO2:  [100 %] 100 % (08/27 0417) Weight:  [75.3 kg] 75.3 kg (08/27 0417)  Weight change: 0.6 kg Filed Weights   12/18/21 0443 12/18/21 2100 12/20/21 0417  Weight: 74.7 kg 74.7 kg 75.3 kg     Intake/Output: I/O last 3 completed shifts: In: 360 [P.O.:360] Out: 25 [Urine:25]   Intake/Output this shift:  No intake/output data recorded.  Physical Exam: General: NAD  Head: Normocephalic, atraumatic. Moist oral mucosal membranes  Eyes: Anicteric  Lungs:  Clear to auscultation, normal effort  Heart: Regular rate and rhythm  Abdomen:  Soft, nontender  Extremities:  No peripheral edema.  Neurologic: Alert, moving upper extremities  Skin: No lesions  Access: Rt Permcath    Basic Metabolic Panel: Recent Labs  Lab 12/14/21 0637 12/15/21 0618 12/16/21 0854  NA 141 139 141  K 4.2 4.4 5.1  CL 106 104 105  CO2 '23 26 23  '$ GLUCOSE 100* 78 79  BUN 47* 30* 48*  CREATININE 5.96* 3.53* 4.86*  CALCIUM 9.1 8.9 9.3    Liver Function Tests: No results for input(s): "AST", "ALT", "ALKPHOS", "BILITOT", "PROT", "ALBUMIN" in the last 168 hours. No results for input(s): "LIPASE", "AMYLASE" in the last 168 hours.  No results for input(s): "AMMONIA" in the last 168 hours.  CBC: Recent Labs  Lab 12/14/21 0637 12/15/21 0618  12/16/21 0854  WBC 3.7* 3.3* 3.5*  NEUTROABS 1.6* 1.3* 1.3*  HGB 8.0* 7.8* 7.9*  HCT 25.2* 24.0* 25.4*  MCV 95.8 94.9 94.4  PLT 172 PLATELET CLUMPS NOTED ON SMEAR, UNABLE TO ESTIMATE 176    Cardiac Enzymes: No results for input(s): "CKTOTAL", "CKMB", "CKMBINDEX", "TROPONINI" in the last 168 hours.  BNP: Invalid input(s): "POCBNP"  CBG: Recent Labs  Lab 12/19/21 0737 12/19/21 1205 12/19/21 1653 12/19/21 2107 12/20/21 0748  GLUCAP 82 74 120* 190* 124*    Microbiology: Results for orders placed or performed during the hospital encounter of 12/12/21  Resp Panel by RT-PCR (Flu A&B, Covid) Anterior Nasal Swab     Status: None   Collection Time: 12/12/21  4:28 AM   Specimen: Anterior Nasal Swab  Result Value Ref Range Status   SARS Coronavirus 2 by RT PCR NEGATIVE NEGATIVE Final    Comment: (NOTE) SARS-CoV-2 target nucleic acids are NOT DETECTED.  The SARS-CoV-2 RNA is generally detectable in upper respiratory specimens during the acute phase of infection. The lowest concentration of SARS-CoV-2 viral copies this assay can detect is 138 copies/mL. A negative result does not preclude SARS-Cov-2 infection and should not be used as the sole basis for treatment or other patient management decisions. A negative result may occur with  improper specimen collection/handling, submission of specimen other than nasopharyngeal swab, presence of viral mutation(s) within the areas targeted by this assay, and inadequate number of viral copies(<138 copies/mL). A negative result must be combined with clinical observations,  patient history, and epidemiological information. The expected result is Negative.  Fact Sheet for Patients:  EntrepreneurPulse.com.au  Fact Sheet for Healthcare Providers:  IncredibleEmployment.be  This test is no t yet approved or cleared by the [place] FDA and  has been authorized for detection and/or diagnosis of  SARS-CoV-2 by FDA under an Emergency Use Authorization (EUA). This EUA will remain  in effect (meaning this test can be used) for the duration of the COVID-19 declaration under Section 564(b)(1) of the Act, 21 U.S.C.section 360bbb-3(b)(1), unless the authorization is terminated  or revoked sooner.       Influenza A by PCR NEGATIVE NEGATIVE Final   Influenza B by PCR NEGATIVE NEGATIVE Final    Comment: (NOTE) The Xpert Xpress SARS-CoV-2/FLU/RSV plus assay is intended as an aid in the diagnosis of influenza from Nasopharyngeal swab specimens and should not be used as a sole basis for treatment. Nasal washings and aspirates are unacceptable for Xpert Xpress SARS-CoV-2/FLU/RSV testing.  Fact Sheet for Patients: EntrepreneurPulse.com.au  Fact Sheet for Healthcare Providers: IncredibleEmployment.be  This test is not yet approved or cleared by the [place] FDA and has been authorized for detection and/or diagnosis of SARS-CoV-2 by FDA under an Emergency Use Authorization (EUA). This EUA will remain in effect (meaning this test can be used) for the duration of the COVID-19 declaration under Section 564(b)(1) of the Act, 21 U.S.C. section 360bbb-3(b)(1), unless the authorization is terminated or revoked.  Performed at [facility], [address] [place], [place] [address]   Gastrointestinal Panel by PCR , Stool     Status: None   Collection Time: 12/12/21  8:26 AM   Specimen: Stool  Result Value Ref Range Status   Campylobacter species NOT DETECTED NOT DETECTED Final   Plesimonas shigelloides NOT DETECTED NOT DETECTED Final   Salmonella species NOT DETECTED NOT DETECTED Final   Yersinia enterocolitica NOT DETECTED NOT DETECTED Final   Vibrio species NOT DETECTED NOT DETECTED Final   Vibrio cholerae NOT DETECTED NOT DETECTED Final   Enteroaggregative E coli (EAEC) NOT DETECTED NOT DETECTED Final   Enteropathogenic E coli (EPEC)  NOT DETECTED NOT DETECTED Final   Enterotoxigenic E coli (ETEC) NOT DETECTED NOT DETECTED Final   Shiga like toxin producing E coli (STEC) NOT DETECTED NOT DETECTED Final   Shigella/Enteroinvasive E coli (EIEC) NOT DETECTED NOT DETECTED Final   Cryptosporidium NOT DETECTED NOT DETECTED Final   Cyclospora cayetanensis NOT DETECTED NOT DETECTED Final   Entamoeba histolytica NOT DETECTED NOT DETECTED Final   Giardia lamblia NOT DETECTED NOT DETECTED Final   Adenovirus F40/41 NOT DETECTED NOT DETECTED Final   Astrovirus NOT DETECTED NOT DETECTED Final   Norovirus GI/GII NOT DETECTED NOT DETECTED Final   Rotavirus A NOT DETECTED NOT DETECTED Final   Sapovirus (I, II, IV, and V) NOT DETECTED NOT DETECTED Final    Comment: Performed at [facility], [address] [place], [place] [address]  C Difficile Quick Screen w PCR reflex     Status: Abnormal   Collection Time: 12/12/21  8:26 AM   Specimen: STOOL  Result Value Ref Range Status   C Diff antigen POSITIVE (A) NEGATIVE Final   C Diff toxin NEGATIVE NEGATIVE Final   C Diff interpretation Results are indeterminate. See PCR results.  Final    Comment: Performed at [facility], [address] [name], [place] [address]  C. Diff by PCR, Reflexed     Status: None   Collection Time: 12/12/21  8:26 AM  Result Value Ref Range Status   Toxigenic C. Difficile by PCR NEGATIVE NEGATIVE Final    Comment: Patient is colonized with non toxigenic C. difficile. May not need treatment unless significant symptoms are present. Performed at [facility], [address] [place], [place] [address]   Blood Culture (routine x 2)     Status: None   Collection Time: 12/12/21 10:16 PM   Specimen: BLOOD LEFT FOREARM  Result Value Ref Range Status   Specimen Description BLOOD LEFT FOREARM  Final   Special Requests   Final    BOTTLES DRAWN AEROBIC AND ANAEROBIC Blood Culture adequate volume   Culture   Final    NO GROWTH 5  DAYS Performed at [facility], [address], [place], [place] [address]    Report Status 12/17/2021 FINAL  Final  Blood Culture (routine x 2)     Status: None   Collection Time: 12/12/21 10:27 PM   Specimen: BLOOD  Result Value Ref Range Status   Specimen Description BLOOD LEFT WRIST  Final   Special Requests   Final    BOTTLES DRAWN AEROBIC AND ANAEROBIC Blood Culture adequate volume   Culture   Final    NO GROWTH 5 DAYS Performed at [facility], [address] [place], [place] [address]    Report Status 12/17/2021 FINAL  Final    Coagulation Studies: No results for input(s): "LABPROT", "INR" in the last 72 hours.   Urinalysis: No results for input(s): "COLORURINE", "LABSPEC", "PHURINE", "GLUCOSEU", "HGBUR", "BILIRUBINUR", "KETONESUR", "PROTEINUR", "UROBILINOGEN", "NITRITE", "LEUKOCYTESUR" in the last 72 hours.  Invalid input(s): "APPERANCEUR"     Imaging: No results found.   Medications:       (feeding supplement) PROSource Plus  30 mL Oral TID BM   vitamin C  500 mg Oral BID   aspirin EC  81 mg Oral Daily   atorvastatin  20 mg Oral Daily   Chlorhexidine Gluconate Cloth  6 each Topical Daily   diclofenac Sodium  4 g Topical QID   epoetin (EPOGEN/PROCRIT) injection  4,000 Units Intravenous Q M,W,F-HD   feeding supplement  1 Container Oral TID BM   gabapentin  100 mg Oral Q M,W,F-1800   heparin  5,000 Units Subcutaneous Q8H   insulin aspart  0-5 Units Subcutaneous QHS   insulin aspart  0-9 Units Subcutaneous TID WC   lacosamide  100 mg Oral BID   lacosamide  50 mg Oral Q M,W,F-HD   leptospermum manuka honey  1 Application Topical Daily   levothyroxine  125 mcg Oral Q0600   lipase/protease/amylase  12,000 Units Oral TID WC   losartan  50 mg Oral Daily   multivitamin  1 tablet Oral QHS   polyethylene glycol  17 g Oral BID   senna-docusate  2 tablet Oral QHS   sevelamer carbonate  800 mg Oral TID WC   valACYclovir  500 mg Oral Daily    zinc sulfate  220 mg Oral Daily   acetaminophen, albuterol, dextromethorphan-guaiFENesin, diclofenac Sodium, fluticasone, hydrALAZINE, iohexol, ondansetron (ZOFRAN) IV, oxyCODONE-acetaminophen  Assessment/ Plan:  [name] is a 56 y.o.  female with past medical history including depression, type 2 diabetes, asthma, hypertension, hypothyroidism, CLL, and end-stage renal disease on hemodialysis.  [facility]/MWF/right PermCath/66.4kg  End-stage renal disease on hemodialysis.   Patient was last dialyzed -Friday No need for renal placement therapy today  Next treatment scheduled for Monday. Patient agreeable to rehab placement, requesting placement closer to  [place].  Renal navigator aware and is currently seeking outpatient transfer.  2. Anemia of chronic kidney disease Lab Results  Component Value Date   HGB 7.9 (L) 12/16/2021  Patient receives [facility] outpatient.  Will order low-dose EPO with treatments.  3. Secondary Hyperparathyroidism: with outpatient labs: PTH 235, phosphorus 6.7, calcium 9.0 on 11/30/2021.   Lab Results  Component Value Date   CALCIUM 9.3 12/16/2021   PHOS 3.2 11/28/2020    Calcium and phosphorus within acceptable range.  4. 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 Home regimen includes lispro. Most recent hemoglobin A1c is 4.9 on 12/09/2021.   Primary team will manage Sliding Scale insulin.   5.  Syncopal episode during dialysis treatment.  Cardiology consulted due to elevated troponins overnight.  Echo shows EF 55 to 60%.  Home cardiac monitor remains in place from previous admission.    6.  Nausea/sacral wound/retained stool/C. difficile positive. Management as per internal medicine team.  Currently has received aztreonam, cefepime, metronidazole and vancomycin IV -Fecal impaction resolved -General surgery consulted and will defer I&D.  - WOC following    LOS: 8 Tonya Myers s  Tonya Myers 8/27/20239:19 AM

## 2021-12-20 NOTE — Progress Notes (Signed)
PROGRESS NOTE    Tonya Myers  ZWC:[id number] DOB: 1965-06-04 DOA: 12/12/2021 PCP: [name], MD    Brief Narrative:  56 y.o. 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    Pt was recently hospitalized from 8/16 - 8/18 due to syncope.  Per report, dialysis staff performed 2 minutes of CPR prior to arrival to [facility]. Pt had elevated trop up to  452.  Cardiology was consulted, suspected that her syncopal episode was vasovagal/hypotensive in nature, and troponin elevation was demand ischemia in that setting. 2 D echo on 8/17 showed LVEF of 55-60% without WMA's or other valvular pathologies. Pt also has had UTI and was started on Cipro at the discharge.    Patient states that after she went home, she has worsening weakness.  She has fever and chills.  She continues to have symptoms of UTI, including dysuria, burning on urination and urinary frequency.  She also reports nausea, diarrhea and mild lower abdominal pain.  No vomiting.  Patient has had 5 times of diarrhea.   C. difficile assay negative.  Antigen positive toxin negative.  Diarrhea has resolved.  No clinical indications of acute C. difficile infection.  Sacral decubitus ulcer has been evaluated by surgery.  No surgical intervention warranted.  Nephrology following for inpatient hemodialysis needs.  8/23: Rapid response called last night due to decreased level of responsiveness and bilateral lower extremity weakness.  Code stroke called.  CT head negative.  Patient offered MRI but refused.  Stated she was simply tired.  When evaluated the following morning no focal  neurologic deficit noted however patient is noted to have slow responses and be somewhat tremulous.  Discussed with nephrology.  Possible effect from pregabalin.  Dose decreased to renal dose of 75 mg nightly.  Patient requested dc of lyrica and attempt at gabapentin.  Seems to be clinically improved on 8/25   Assessment & Plan:   Principal Problem:   Diarrhea Active Problems:   Stercoral colitis   Sacral wound   UTI (urinary tract infection)   End-stage renal disease on hemodialysis (HCC)   Essential hypertension   Hypothyroidism   Dyslipidemia   Myocardial injury   Type II diabetes mellitus with renal manifestations (HCC)   Anemia in ESRD (end-stage renal disease) (HCC)   Chronic pancreatitis (HCC)   Asthma   Closed fracture of sternum   Transient neurologic deficit 12/15/21   History of herpes genitalis   Malnutrition of moderate degree  Decreased LOC  weakness Tremulousness Rapid response called 8/22 for decreased level responsiveness and weakness.  Also associated with decreased grip strength.  Unclear etiology.  CT head negative.  Code stroke called.  MRI was offered but patient declined.  Stating fatigue.  The following morning the patient is neurologically intact however is continuing to endorse weakness and has some tremulous this on exam.  Delayed response time. Suspect medication effect secondary to Lyrica Plan: Continue gabapentin with dialysis dosing Status appears to be at baseline   Stercoral colitis Diarrhea Patient presented with diarrhea in the setting of fecal impaction resulting in stercoral colitis.  Bowel regimen with disimpaction was effective and now patient is having bowel movements. Patient is  now completed 5 days of cefepime and Flagyl Plan: No further antibiotics Patient having formed stools.  No indication for enteric precautions   Stage IV sacral decubitus ulcer Seen and evaluated by general surgery.  No indication for surgical intervention  at this time Wound care consult appreciated Plan: No further antibiotics Wound care Therapy as tolerated Need skilled nursing facility [facility] aware looking for options  Possible urinary tract infection Patient was discharged on ciprofloxacin 1 day prior to presentation Still has typical symptoms of UTI Has completed empiric course of treatment No further antibiotics  ESRD on HD Nephrology following for inpatient HD needs HD 8/23, HD 8/25 Otherwise medically ready for discharge  Essential hypertension Poor control Continue PTA Cozaar Scheduled hydralazine PRN IV hydralazine  Hypothyroidism PTA Synthroid  Elevated troponins No significant delta Low suspicion for ACS  Type II diabetes mellitus with renal manifestations ([place]) Sliding scale Last A1c 4.9, well controlled  Anemia of chronic kidney disease Hemoglobin stable  Chronic pancreatitis Normal LFTs and lipase Creon started this admission  History of asthma Stable, unclear severity P.o. and bronchodilators  Closed fracture of sternum No indication for surgical intervention Multimodal pain control  History of herpes genitalis PTA suppressive valacyclovir    DVT prophylaxis: SQ heparin Code Status: Full Family Communication: [name] [phone] on 8/22, 8/27 Disposition Plan: Status is: Inpatient Remains inpatient appropriate because: Patient medically stable and can discharge to skilled nursing facility once we have an available bed   Level of care: Med-Surg  Consultants:  Nephrology  Procedures:  None  Antimicrobials:  Subjective: Patient seen and examined.  Endorses some weakness but improved from prior.  Still endorsing some arthritic pain in the feet  Objective: Vitals:   12/19/21 2325 12/20/21 0417 12/20/21 0749 12/20/21 1149  BP: (!) 101/56 106/70 132/74 135/84  Pulse: 100 69 66 72  Resp: '17 18 18 18  '$ Temp: 98.4 F (36.9 C) 97.9 F (36.6 C) 98.3 F (36.8 C) 97.8 F  (36.6 C)  TempSrc: Oral Oral  Oral  SpO2: 100% 100% 99% 100%  Weight:  75.3 kg    Height:        Intake/Output Summary (Last 24 hours) at 12/20/2021 1229 Last data filed at 12/19/2021 2000 Gross per 24 hour  Intake 120 ml  Output 25 ml  Net 95 ml   Filed Weights   12/18/21 0443 12/18/21 2100 12/20/21 0417  Weight: 74.7 kg 74.7 kg 75.3 kg    Examination:  General exam: No acute distress Respiratory system: Lungs clear.  Normal work of breathing.  Room air Cardiovascular system: S2, RRR, no murmurs, no pedal edema Gastrointestinal system: Soft, NT/ND, normal bowel sounds Central nervous system: Alert and oriented.  No focal deficits.  No tremors Extremities: Decreased power BLE Skin: Stage IV sacral decubitus Psychiatry: Judgement and insight appear normal. Mood & affect appropriate.     Data Reviewed: I have personally reviewed following labs and imaging studies  CBC: Recent Labs  Lab 12/14/21 0637 12/15/21 0618 12/16/21 0854  WBC 3.7* 3.3* 3.5*  NEUTROABS 1.6* 1.3* 1.3*  HGB 8.0* 7.8* 7.9*  HCT 25.2* 24.0* 25.4*  MCV 95.8 94.9 94.4  PLT 172 PLATELET CLUMPS NOTED ON SMEAR, UNABLE TO ESTIMATE 182   Basic Metabolic Panel: Recent Labs  Lab 12/14/21 0637 12/15/21 0618 12/16/21 0854  NA 141 139 141  K 4.2 4.4 5.1  CL 106 104 105  CO2 '23 26 23  '$ GLUCOSE 100* 78 79  BUN 47* 30* 48*  CREATININE 5.96* 3.53* 4.86*  CALCIUM 9.1 8.9 9.3   GFR: Estimated Creatinine Clearance: 14.6 mL/min (A) (by C-G formula based on SCr of 4.86 mg/dL (H)). Liver Function Tests: No results for input(s): "AST", "ALT", "ALKPHOS", "BILITOT", "PROT", "ALBUMIN" in the last 168 hours.  No results for input(s): "LIPASE", "AMYLASE" in the last 168 hours.  No results for input(s): "AMMONIA" in the last 168 hours. Coagulation Profile: No results for input(s): "INR", "PROTIME" in the last 168 hours.  Cardiac Enzymes: No results for input(s): "CKTOTAL", "CKMB", "CKMBINDEX", "TROPONINI" in  the last 168 hours. BNP (last 3 results) No results for input(s): "PROBNP" in the last 8760 hours. HbA1C: No results for input(s): "HGBA1C" in the last 72 hours. CBG: Recent Labs  Lab 12/19/21 1205 12/19/21 1653 12/19/21 2107 12/20/21 0748 12/20/21 1151  GLUCAP 74 120* 190* 124* 109*   Lipid Profile: No results for input(s): "CHOL", "HDL", "LDLCALC", "TRIG", "CHOLHDL", "LDLDIRECT" in the last 72 hours. Thyroid Function Tests: No results for input(s): "TSH", "T4TOTAL", "FREET4", "T3FREE", "THYROIDAB" in the last 72 hours. Anemia Panel: No results for input(s): "VITAMINB12", "FOLATE", "FERRITIN", "TIBC", "IRON", "RETICCTPCT" in the last 72 hours. Sepsis Labs: No results for input(s): "PROCALCITON", "LATICACIDVEN" in the last 168 hours.   Recent Results (from the past 240 hour(s))  Urine Culture     Status: Abnormal   Collection Time: 12/10/21  9:00 PM   Specimen: Urine, Clean Catch  Result Value Ref Range Status   Specimen Description   Final    URINE, CLEAN CATCH Performed at [facility], [address], [place], [place] [address]    Special Requests   Final    NONE Performed at [facility], [address] [place], [place] [address]    Culture (A)  Final    <10,000 COLONIES/mL INSIGNIFICANT GROWTH Performed at [facility] [address] [place], [place] [address]    Report Status 12/12/2021 FINAL  Final  Resp Panel by RT-PCR (Flu A&B, Covid) Anterior Nasal Swab     Status: None   Collection Time: 12/12/21  4:28 AM   Specimen: Anterior Nasal Swab  Result Value Ref Range Status   SARS Coronavirus 2 by RT PCR NEGATIVE NEGATIVE Final    Comment: (NOTE) SARS-CoV-2 target nucleic acids are NOT DETECTED.  The SARS-CoV-2 RNA is generally detectable in upper respiratory specimens during the acute phase of infection. The lowest concentration of SARS-CoV-2 viral copies this assay can detect is 138 copies/mL. A negative result does not preclude  SARS-Cov-2 infection and should not be used as the sole basis for treatment or other patient management decisions. A negative result may occur with  improper specimen collection/handling, submission of specimen other than nasopharyngeal swab, presence of viral mutation(s) within the areas targeted by this assay, and inadequate number of viral copies(<138 copies/mL). A negative result must be combined with clinical observations, patient history, and epidemiological information. The expected result is Negative.  Fact Sheet for Patients:  EntrepreneurPulse.com.au  Fact Sheet for Healthcare Providers:  IncredibleEmployment.be  This test is no t yet approved or cleared by the [place] FDA and  has been authorized for detection and/or diagnosis of SARS-CoV-2 by FDA under an Emergency Use Authorization (EUA). This EUA will remain  in effect (meaning this test can be used) for the duration of the COVID-19 declaration under Section 564(b)(1) of the Act, 21 U.S.C.section 360bbb-3(b)(1), unless the authorization is terminated  or revoked sooner.       Influenza A  by PCR NEGATIVE NEGATIVE Final   Influenza B by PCR NEGATIVE NEGATIVE Final    Comment: (NOTE) The Xpert Xpress SARS-CoV-2/FLU/RSV plus assay is intended as an aid in the diagnosis of influenza from Nasopharyngeal swab specimens and should not be used as a sole basis for treatment. Nasal washings and aspirates are unacceptable for Xpert Xpress SARS-CoV-2/FLU/RSV testing.  Fact Sheet for Patients: EntrepreneurPulse.com.au  Fact Sheet for Healthcare Providers: IncredibleEmployment.be  This test is not yet approved or cleared by the [place] FDA and has been authorized for detection and/or diagnosis of SARS-CoV-2 by FDA under an Emergency Use Authorization (EUA). This EUA will remain in effect (meaning this test can be used) for the duration of  the COVID-19 declaration under Section 564(b)(1) of the Act, 21 U.S.C. section 360bbb-3(b)(1), unless the authorization is terminated or revoked.  Performed at [facility], [address] [place], [place] [address]   Gastrointestinal Panel by PCR , Stool     Status: None   Collection Time: 12/12/21  8:26 AM   Specimen: Stool  Result Value Ref Range Status   Campylobacter species NOT DETECTED NOT DETECTED Final   Plesimonas shigelloides NOT DETECTED NOT DETECTED Final   Salmonella species NOT DETECTED NOT DETECTED Final   Yersinia enterocolitica NOT DETECTED NOT DETECTED Final   Vibrio species NOT DETECTED NOT DETECTED Final   Vibrio cholerae NOT DETECTED NOT DETECTED Final   Enteroaggregative E coli (EAEC) NOT DETECTED NOT DETECTED Final   Enteropathogenic E coli (EPEC) NOT DETECTED NOT DETECTED Final   Enterotoxigenic E coli (ETEC) NOT DETECTED NOT DETECTED Final   Shiga like toxin producing E coli (STEC) NOT DETECTED NOT DETECTED Final   Shigella/Enteroinvasive E coli (EIEC) NOT DETECTED NOT DETECTED Final   Cryptosporidium NOT DETECTED NOT DETECTED Final   Cyclospora cayetanensis NOT DETECTED NOT DETECTED Final   Entamoeba histolytica NOT DETECTED NOT DETECTED Final   Giardia lamblia NOT DETECTED NOT DETECTED Final   Adenovirus F40/41 NOT DETECTED NOT DETECTED Final   Astrovirus NOT DETECTED NOT DETECTED Final   Norovirus GI/GII NOT DETECTED NOT DETECTED Final   Rotavirus A NOT DETECTED NOT DETECTED Final   Sapovirus (I, II, IV, and V) NOT DETECTED NOT DETECTED Final    Comment: Performed at [facility], [address] [place], [place] [address]  C Difficile Quick Screen w PCR reflex     Status: Abnormal   Collection Time: 12/12/21  8:26 AM   Specimen: STOOL  Result Value Ref Range Status   C Diff antigen POSITIVE (A) NEGATIVE Final   C Diff toxin NEGATIVE NEGATIVE Final   C Diff interpretation Results are indeterminate. See PCR results.  Final     Comment: Performed at [facility], [address] [place], [place] [address]  C. Diff by PCR, Reflexed     Status: None   Collection Time: 12/12/21  8:26 AM  Result Value Ref Range Status   Toxigenic C. Difficile by PCR NEGATIVE NEGATIVE Final    Comment: Patient is colonized with non toxigenic C. difficile. May not need treatment unless significant symptoms are present. Performed at [facility], [address] [place], [place] [address]   Blood Culture (routine x 2)     Status: None   Collection Time: 12/12/21 10:16 PM   Specimen: BLOOD LEFT FOREARM  Result Value Ref Range Status   Specimen Description BLOOD LEFT FOREARM  Final   Special Requests   Final    BOTTLES DRAWN  AEROBIC AND ANAEROBIC Blood Culture adequate volume   Culture   Final    NO GROWTH 5 DAYS Performed at [facility], [address] [place], [place] [address]    Report Status 12/17/2021 FINAL  Final  Blood Culture (routine x 2)     Status: None   Collection Time: 12/12/21 10:27 PM   Specimen: BLOOD  Result Value Ref Range Status   Specimen Description BLOOD LEFT WRIST  Final   Special Requests   Final    BOTTLES DRAWN AEROBIC AND ANAEROBIC Blood Culture adequate volume   Culture   Final    NO GROWTH 5 DAYS Performed at [facility], [address] [place], [place] [address]    Report Status 12/17/2021 FINAL  Final         Radiology Studies: No results found.      Scheduled Meds:  (feeding supplement) PROSource Plus  30 mL Oral TID BM   vitamin C  500 mg Oral BID   aspirin EC  81 mg Oral Daily   atorvastatin  20 mg Oral Daily   Chlorhexidine Gluconate Cloth  6 each Topical Daily   diclofenac Sodium  4 g Topical QID   epoetin (EPOGEN/PROCRIT) injection  4,000 Units Intravenous Q M,W,F-HD   feeding supplement  1 Container Oral TID BM   gabapentin  100 mg Oral Q M,W,F-1800   heparin  5,000 Units Subcutaneous Q8H   insulin aspart  0-5 Units  Subcutaneous QHS   insulin aspart  0-9 Units Subcutaneous TID WC   lacosamide  100 mg Oral BID   lacosamide  50 mg Oral Q M,W,F-HD   leptospermum manuka honey  1 Application Topical Daily   levothyroxine  125 mcg Oral Q0600   lipase/protease/amylase  12,000 Units Oral TID WC   losartan  50 mg Oral Daily   multivitamin  1 tablet Oral QHS   polyethylene glycol  17 g Oral BID   senna-docusate  2 tablet Oral QHS   sevelamer carbonate  800 mg Oral TID WC   valACYclovir  500 mg Oral Daily   zinc sulfate  220 mg Oral Daily   Continuous Infusions:     LOS: 8 days     Sidney Ace, MD [facility]   If 7PM-7AM, please contact night-coverage  12/20/2021, 12:29 PM

## 2021-12-21 DIAGNOSIS — S31000D Unspecified open wound of lower back and pelvis without penetration into retroperitoneum, subsequent encounter: Secondary | ICD-10-CM | POA: Diagnosis not present

## 2021-12-21 DIAGNOSIS — R55 Syncope and collapse: Secondary | ICD-10-CM | POA: Diagnosis not present

## 2021-12-21 DIAGNOSIS — N186 End stage renal disease: Secondary | ICD-10-CM | POA: Diagnosis not present

## 2021-12-21 DIAGNOSIS — R197 Diarrhea, unspecified: Secondary | ICD-10-CM | POA: Diagnosis not present

## 2021-12-21 DIAGNOSIS — D631 Anemia in chronic kidney disease: Secondary | ICD-10-CM | POA: Diagnosis not present

## 2021-12-21 DIAGNOSIS — R531 Weakness: Secondary | ICD-10-CM | POA: Diagnosis not present

## 2021-12-21 LAB — RENAL FUNCTION PANEL
Albumin: 3 g/dL — ABNORMAL LOW (ref 3.5–5.0)
Anion gap: 13 (ref 5–15)
BUN: 81 mg/dL — ABNORMAL HIGH (ref 6–20)
CO2: 26 mmol/L (ref 22–32)
Calcium: 9.6 mg/dL (ref 8.9–10.3)
Chloride: 100 mmol/L (ref 98–111)
Creatinine, Ser: 5.24 mg/dL — ABNORMAL HIGH (ref 0.44–1.00)
GFR, Estimated: 9 mL/min — ABNORMAL LOW (ref >60)
Glucose, Bld: 79 mg/dL (ref 70–99)
Phosphorus: 3.7 mg/dL (ref 2.5–4.6)
Potassium: 5.8 mmol/L — ABNORMAL HIGH (ref 3.5–5.1)
Sodium: 139 mmol/L (ref 135–145)

## 2021-12-21 LAB — CBC WITH DIFFERENTIAL/PLATELET
Abs Immature Granulocytes: 0.02 10*3/uL (ref 0.00–0.07)
Basophils Absolute: 0 10*3/uL (ref 0.0–0.1)
Basophils Relative: 1 %
Eosinophils Absolute: 0.1 10*3/uL (ref 0.0–0.5)
Eosinophils Relative: 3 %
HCT: 24.6 % — ABNORMAL LOW (ref 36.0–46.0)
Hemoglobin: 7.7 g/dL — ABNORMAL LOW (ref 12.0–15.0)
Immature Granulocytes: 1 %
Lymphocytes Relative: 40 %
Lymphs Abs: 1.7 10*3/uL (ref 0.7–4.0)
MCH: 30.4 pg (ref 26.0–34.0)
MCHC: 31.3 g/dL (ref 30.0–36.0)
MCV: 97.2 fL (ref 80.0–100.0)
Monocytes Absolute: 0.5 10*3/uL (ref 0.1–1.0)
Monocytes Relative: 11 %
Neutro Abs: 1.9 10*3/uL (ref 1.7–7.7)
Neutrophils Relative %: 44 %
Platelets: 214 10*3/uL (ref 150–400)
RBC: 2.53 MIL/uL — ABNORMAL LOW (ref 3.87–5.11)
RDW: 18.1 % — ABNORMAL HIGH (ref 11.5–15.5)
WBC: 4.3 10*3/uL (ref 4.0–10.5)
nRBC: 0 % (ref 0.0–0.2)

## 2021-12-21 LAB — GLUCOSE, CAPILLARY
Glucose-Capillary: 114 mg/dL — ABNORMAL HIGH (ref 70–99)
Glucose-Capillary: 99 mg/dL (ref 70–99)

## 2021-12-21 LAB — CBC
HCT: 25 % — ABNORMAL LOW (ref 36.0–46.0)
Hemoglobin: 7.8 g/dL — ABNORMAL LOW (ref 12.0–15.0)
MCH: 29.9 pg (ref 26.0–34.0)
MCHC: 31.2 g/dL (ref 30.0–36.0)
MCV: 95.8 fL (ref 80.0–100.0)
Platelets: 203 10*3/uL (ref 150–400)
RBC: 2.61 MIL/uL — ABNORMAL LOW (ref 3.87–5.11)
RDW: 18.3 % — ABNORMAL HIGH (ref 11.5–15.5)
WBC: 4.2 10*3/uL (ref 4.0–10.5)
nRBC: 0 % (ref 0.0–0.2)

## 2021-12-21 MED ORDER — HEPARIN SODIUM (PORCINE) 1000 UNIT/ML IJ SOLN
INTRAMUSCULAR | Status: AC
  Filled 2021-12-21: qty 10

## 2021-12-21 MED ORDER — EPOETIN ALFA 4000 UNIT/ML IJ SOLN
INTRAMUSCULAR | Status: AC
  Administered 2021-12-21: 4000 [IU]
  Filled 2021-12-21: qty 1

## 2021-12-21 NOTE — Progress Notes (Signed)
PROGRESS NOTE    Tonya Myers  SNK:[id number] DOB: 1965/06/27 DOA: 12/12/2021 PCP: [name], MD    Brief Narrative:  56 y.o. 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    Pt was recently hospitalized from 8/16 - 8/18 due to syncope.  Per report, dialysis staff performed 2 minutes of CPR prior to arrival to Ed. Pt had elevated trop up to  452.  Cardiology was consulted, suspected that her syncopal episode was vasovagal/hypotensive in nature, and troponin elevation was demand ischemia in that setting. 2 D echo on 8/17 showed LVEF of 55-60% without WMA's or other valvular pathologies. Pt also has had UTI and was started on Cipro at the discharge.    Patient states that after she went home, she has worsening weakness.  She has fever and chills.  She continues to have symptoms of UTI, including dysuria, burning on urination and urinary frequency.  She also reports nausea, diarrhea and mild lower abdominal pain.  No vomiting.  Patient has had 5 times of diarrhea.   C. difficile assay negative.  Antigen positive toxin negative.  Diarrhea has resolved.  No clinical indications of acute C. difficile infection.  Sacral decubitus ulcer has been evaluated by surgery.  No surgical intervention warranted.  Nephrology following for inpatient hemodialysis needs.  8/23: Rapid response called last night due to decreased level of responsiveness and bilateral lower extremity weakness.  Code stroke called.  CT head negative.  Patient offered MRI but refused.  Stated she was simply tired.  When evaluated the following morning no focal  neurologic deficit noted however patient is noted to have slow responses and be somewhat tremulous.  Discussed with nephrology.  Possible effect from pregabalin.  Dose decreased to renal dose of 75 mg nightly.  Patient requested dc of lyrica and attempt at gabapentin.  Seems to be clinically improved on 8/25   Assessment & Plan:   Principal Problem:   Diarrhea Active Problems:   Stercoral colitis   Sacral wound   UTI (urinary tract infection)   End-stage renal disease on hemodialysis (HCC)   Essential hypertension   Hypothyroidism   Dyslipidemia   Myocardial injury   Type II diabetes mellitus with renal manifestations (HCC)   Anemia in ESRD (end-stage renal disease) (HCC)   Chronic pancreatitis (HCC)   Asthma   Closed fracture of sternum   Transient neurologic deficit 12/15/21   History of herpes genitalis   Malnutrition of moderate degree  Decreased LOC  weakness Tremulousness Rapid response called 8/22 for decreased level responsiveness and weakness.  Also associated with decreased grip strength.  Unclear etiology.  CT head negative.  Code stroke called.  MRI was offered but patient declined.  Stating fatigue.  The following morning the patient is neurologically intact however is continuing to endorse weakness and has some tremulous this on exam.  Delayed response time. Suspect medication effect secondary to Lyrica Plan: Continue gabapentin with dialysis dosing Mental status appears to be at baseline   Stercoral colitis Diarrhea Patient presented with diarrhea in the setting of fecal impaction resulting in stercoral colitis.  Bowel regimen with disimpaction was effective and now patient is having bowel movements. Patient  is now completed 5 days of cefepime and Flagyl Plan: No further antibiotics Patient having formed stools.  No indication for enteric precautions   Stage IV sacral decubitus ulcer Seen and evaluated by general surgery.  No indication for surgical  intervention at this time Wound care consult appreciated Plan: No further antibiotics Wound care Therapy as tolerated Need skilled nursing facility [facility] aware looking for options  Possible urinary tract infection Patient was discharged on ciprofloxacin 1 day prior to presentation Still has typical symptoms of UTI Has completed empiric course of treatment No further antibiotics  ESRD on HD Nephrology following for inpatient HD needs HD 8/23, HD 8/25 Otherwise medically ready for discharge  Essential hypertension Poor control Continue PTA Cozaar Scheduled hydralazine PRN IV hydralazine  Hypothyroidism PTA Synthroid  Elevated troponins No significant delta Low suspicion for ACS  Type II diabetes mellitus with renal manifestations ([place]) Sliding scale Last A1c 4.9, well controlled  Anemia of chronic kidney disease Hemoglobin stable  Chronic pancreatitis Normal LFTs and lipase Creon started this admission  History of asthma Stable, unclear severity P.o. and bronchodilators  Closed fracture of sternum No indication for surgical intervention Multimodal pain control  History of herpes genitalis PTA suppressive valacyclovir    DVT prophylaxis: SQ heparin Code Status: Full Family CommunicationDayton Martes [phone] on 8/22, 8/27 Disposition Plan: Status is: Inpatient Remains inpatient appropriate because: Unsafe discharge plan.  Patient medically stable for discharge.  Pending insurance authorization and acceptance to skilled nursing facility.   Level of care: Med-Surg  Consultants:  Nephrology  Procedures:  None  Antimicrobials:  Subjective: Patient seen and examined.  HD today.  Still with arthritic pain in feet.  Lidoderm patch added.  Objective: Vitals:   12/21/21 0850 12/21/21 0858 12/21/21 0930 12/21/21 1000  BP: (!) 167/99 (!) 188/109 (!) 186/99 (!) 183/137  Pulse: 61 61 64 75  Resp: (!) '9 17 14 14  '$ Temp: 98.3 F (36.8 C)      TempSrc: Oral     SpO2: 100% 100% 100% 100%  Weight:      Height:        Intake/Output Summary (Last 24 hours) at 12/21/2021 1025 Last data filed at 12/20/2021 2200 Gross per 24 hour  Intake --  Output 50 ml  Net -50 ml   Filed Weights   12/18/21 0443 12/18/21 2100 12/20/21 0417  Weight: 74.7 kg 74.7 kg 75.3 kg    Examination:  General exam: In ED Respiratory system: Lungs clear.  Normal work of breathing.  Room air Cardiovascular system: S2, RRR, no murmurs, no pedal edema Gastrointestinal system: Soft, NT/ND, normal bowel sounds Central nervous system: Alert and oriented.  No focal deficits.  No tremors Extremities: Decreased power BLE.  Muscle wasting noted bilateral lower extremities Skin: Stage IV sacral decubitus Psychiatry: Judgement and insight appear normal. Mood & affect appropriate.     Data Reviewed: I have personally reviewed following labs and imaging studies  CBC: Recent Labs  Lab 12/15/21 0618 12/16/21 0854 12/21/21 0341 12/21/21 0900  WBC 3.3* 3.5* 4.2 4.3  NEUTROABS 1.3* 1.3*  --  1.9  HGB 7.8* 7.9* 7.8* 7.7*  HCT 24.0* 25.4* 25.0* 24.6*  MCV 94.9 94.4 95.8 97.2  PLT PLATELET CLUMPS NOTED ON SMEAR, UNABLE TO ESTIMATE 176 203 626   Basic Metabolic Panel: Recent Labs  Lab 12/15/21 0618 12/16/21 0854 12/21/21 0859  NA 139 141 139  K 4.4 5.1 5.8*  CL 104 105 100  CO2 26 23 26  GLUCOSE 78 79 79  BUN 30* 48* 81*  CREATININE 3.53* 4.86* 5.24*  CALCIUM 8.9 9.3 9.6  PHOS  --   --  3.7   GFR: Estimated Creatinine Clearance: 13.5 mL/min (A) (by C-G formula based on SCr of 5.24 mg/dL (H)). Liver Function Tests: Recent Labs  Lab 12/21/21 0859  ALBUMIN 3.0*    No results for input(s): "LIPASE", "AMYLASE" in the last 168 hours.  No results for input(s): "AMMONIA" in the last 168 hours. Coagulation Profile: No results for input(s): "INR", "PROTIME" in the last 168 hours.  Cardiac Enzymes: No results for input(s): "CKTOTAL", "CKMB",  "CKMBINDEX", "TROPONINI" in the last 168 hours. BNP (last 3 results) No results for input(s): "PROBNP" in the last 8760 hours. HbA1C: No results for input(s): "HGBA1C" in the last 72 hours. CBG: Recent Labs  Lab 12/19/21 2107 12/20/21 0748 12/20/21 1151 12/20/21 1623 12/20/21 2058  GLUCAP 190* 124* 109* 160* 121*   Lipid Profile: No results for input(s): "CHOL", "HDL", "LDLCALC", "TRIG", "CHOLHDL", "LDLDIRECT" in the last 72 hours. Thyroid Function Tests: No results for input(s): "TSH", "T4TOTAL", "FREET4", "T3FREE", "THYROIDAB" in the last 72 hours. Anemia Panel: No results for input(s): "VITAMINB12", "FOLATE", "FERRITIN", "TIBC", "IRON", "RETICCTPCT" in the last 72 hours. Sepsis Labs: No results for input(s): "PROCALCITON", "LATICACIDVEN" in the last 168 hours.   Recent Results (from the past 240 hour(s))  Resp Panel by RT-PCR (Flu A&B, Covid) Anterior Nasal Swab     Status: None   Collection Time: 12/12/21  4:28 AM   Specimen: Anterior Nasal Swab  Result Value Ref Range Status   SARS Coronavirus 2 by RT PCR NEGATIVE NEGATIVE Final    Comment: (NOTE) SARS-CoV-2 target nucleic acids are NOT DETECTED.  The SARS-CoV-2 RNA is generally detectable in upper respiratory specimens during the acute phase of infection. The lowest concentration of SARS-CoV-2 viral copies this assay can detect is 138 copies/mL. A negative result does not preclude SARS-Cov-2 infection and should not be used as the sole basis for treatment or other patient management decisions. A negative result may occur with  improper specimen collection/handling, submission of specimen other than nasopharyngeal swab, presence of viral mutation(s) within the areas targeted by this assay, and inadequate number of viral copies(<138 copies/mL). A negative result must be combined with clinical observations, patient history, and epidemiological information. The expected result is Negative.  Fact Sheet for Patients:   EntrepreneurPulse.com.au  Fact Sheet for Healthcare Providers:  IncredibleEmployment.be  This test is no t yet approved or cleared by the [place] FDA and  has been authorized for detection and/or diagnosis of SARS-CoV-2 by FDA under an Emergency Use Authorization (EUA). This EUA will remain  in effect (meaning this test can be used) for the duration of the COVID-19 declaration under Section 564(b)(1) of the Act, 21 U.S.C.section 360bbb-3(b)(1), unless the authorization is terminated  or revoked sooner.       Influenza A by PCR NEGATIVE NEGATIVE Final   Influenza B by PCR NEGATIVE NEGATIVE Final    Comment: (NOTE) The Xpert Xpress SARS-CoV-2/FLU/RSV plus assay is intended as an aid in the diagnosis of influenza from Nasopharyngeal swab specimens and should not be used as a sole basis for treatment. Nasal washings and aspirates are unacceptable for Xpert Xpress SARS-CoV-2/FLU/RSV testing.  Fact Sheet for Patients: EntrepreneurPulse.com.au  Fact Sheet for Healthcare Providers: IncredibleEmployment.be  This test is not yet approved or cleared by the [place] FDA and has been authorized for detection and/or diagnosis of SARS-CoV-2 by  FDA under an Emergency Use Authorization (EUA). This EUA will remain in effect (meaning this test can be used) for the duration of the COVID-19 declaration under Section 564(b)(1) of the Act, 21 U.S.C. section 360bbb-3(b)(1), unless the authorization is terminated or revoked.  Performed at [facility], [address] [place], [place] [address]   Gastrointestinal Panel by PCR , Stool     Status: None   Collection Time: 12/12/21  8:26 AM   Specimen: Stool  Result Value Ref Range Status   Campylobacter species NOT DETECTED NOT DETECTED Final   Plesimonas shigelloides NOT DETECTED NOT DETECTED Final   Salmonella species NOT DETECTED NOT DETECTED Final    Yersinia enterocolitica NOT DETECTED NOT DETECTED Final   Vibrio species NOT DETECTED NOT DETECTED Final   Vibrio cholerae NOT DETECTED NOT DETECTED Final   Enteroaggregative E coli (EAEC) NOT DETECTED NOT DETECTED Final   Enteropathogenic E coli (EPEC) NOT DETECTED NOT DETECTED Final   Enterotoxigenic E coli (ETEC) NOT DETECTED NOT DETECTED Final   Shiga like toxin producing E coli (STEC) NOT DETECTED NOT DETECTED Final   Shigella/Enteroinvasive E coli (EIEC) NOT DETECTED NOT DETECTED Final   Cryptosporidium NOT DETECTED NOT DETECTED Final   Cyclospora cayetanensis NOT DETECTED NOT DETECTED Final   Entamoeba histolytica NOT DETECTED NOT DETECTED Final   Giardia lamblia NOT DETECTED NOT DETECTED Final   Adenovirus F40/41 NOT DETECTED NOT DETECTED Final   Astrovirus NOT DETECTED NOT DETECTED Final   Norovirus GI/GII NOT DETECTED NOT DETECTED Final   Rotavirus A NOT DETECTED NOT DETECTED Final   Sapovirus (I, II, IV, and V) NOT DETECTED NOT DETECTED Final    Comment: Performed at [facility], [address] [place], [place] [address]  C Difficile Quick Screen w PCR reflex     Status: Abnormal   Collection Time: 12/12/21  8:26 AM   Specimen: STOOL  Result Value Ref Range Status   C Diff antigen POSITIVE (A) NEGATIVE Final   C Diff toxin NEGATIVE NEGATIVE Final   C Diff interpretation Results are indeterminate. See PCR results.  Final    Comment: Performed at [facility], [address] [place], [place] [address]  C. Diff by PCR, Reflexed     Status: None   Collection Time: 12/12/21  8:26 AM  Result Value Ref Range Status   Toxigenic C. Difficile by PCR NEGATIVE NEGATIVE Final    Comment: Patient is colonized with non toxigenic C. difficile. May not need treatment unless significant symptoms are present. Performed at [facility], [address] [place], [place] [address]   Blood Culture (routine x 2)     Status: None   Collection Time: 12/12/21 10:16  PM   Specimen: BLOOD LEFT FOREARM  Result Value Ref Range Status   Specimen Description BLOOD LEFT FOREARM  Final   Special Requests   Final    BOTTLES DRAWN AEROBIC AND ANAEROBIC Blood Culture adequate volume   Culture   Final    NO GROWTH 5 DAYS Performed at [facility], [address] [place], [place] [address]    Report Status 12/17/2021 FINAL  Final  Blood Culture (routine x 2)     Status: None   Collection Time: 12/12/21 10:27 PM   Specimen: BLOOD  Result Value Ref Range Status   Specimen Description BLOOD LEFT WRIST  Final   Special Requests   Final    BOTTLES DRAWN AEROBIC AND ANAEROBIC Blood Culture adequate volume  Culture   Final    NO GROWTH 5 DAYS Performed at [facility], [address], [place], [place] [address]    Report Status 12/17/2021 FINAL  Final         Radiology Studies: No results found.      Scheduled Meds:  (feeding supplement) PROSource Plus  30 mL Oral TID BM   vitamin C  500 mg Oral BID   aspirin EC  81 mg Oral Daily   atorvastatin  20 mg Oral Daily   Chlorhexidine Gluconate Cloth  6 each Topical Daily   diclofenac Sodium  4 g Topical QID   epoetin (EPOGEN/PROCRIT) injection  4,000 Units Intravenous Q M,W,F-HD   feeding supplement  1 Container Oral TID BM   gabapentin  100 mg Oral Q M,W,F-1800   heparin  5,000 Units Subcutaneous Q8H   insulin aspart  0-5 Units Subcutaneous QHS   insulin aspart  0-9 Units Subcutaneous TID WC   lacosamide  100 mg Oral BID   lacosamide  50 mg Oral Q M,W,F-HD   leptospermum manuka honey  1 Application Topical Daily   levothyroxine  125 mcg Oral Q0600   lidocaine  1 patch Transdermal Once   lipase/protease/amylase  12,000 Units Oral TID WC   losartan  50 mg Oral Daily   multivitamin  1 tablet Oral QHS   polyethylene glycol  17 g Oral BID   senna-docusate  2 tablet Oral QHS   sevelamer carbonate  800 mg Oral TID WC   valACYclovir  500 mg Oral Daily   zinc sulfate  220 mg Oral  Daily   Continuous Infusions:     LOS: 9 days     Sidney Ace, MD [facility]   If 7PM-7AM, please contact night-coverage  12/21/2021, 10:25 AM

## 2021-12-21 NOTE — Progress Notes (Signed)
Pt 3.5 hour HD tx complete. Alert, no c/o, vss, report to primary RN.  Start: 2536 End: 6440 2027m fluid removed 63L BVP 4000u Epogen w/ HD UTA weight d/t pt bed scale not working.

## 2021-12-21 NOTE — Plan of Care (Signed)

## 2021-12-21 NOTE — TOC Progression Note (Signed)
Transition of Care [facility]) - Progression Note    Patient Details  Name: Tonya Myers MRN: [id number] Date of Birth: [birth year]  Transition of Care [facility]) [name] [place], RN Phone Number: 12/21/2021, 3:18 PM  Clinical Narrative:     Discussed bed options with the patient and she is agreeable to go to [facility], she went to [place] previously, [place] with Patient pathways will get Dialysis transferred  Expected Discharge Plan: [place] Barriers to Discharge: No SNF bed  Expected Discharge Plan and Services Expected Discharge Plan: [place]: [place] arrangements for the past 2 months: Single Family Home                                       Social Determinants of Health (SDOH) Interventions    Readmission Risk Interventions    11/24/2020    2:17 PM  Readmission Risk Prevention Plan  Transportation Screening Complete  PCP or Specialist Appt within 3-5 Days Complete  HRI or Home Care Consult Complete  Social Work Consult for [place] Planning/Counseling Complete  Palliative Care Screening Not Applicable  Medication Review Press photographer) Complete

## 2021-12-21 NOTE — Progress Notes (Signed)
Post HD vitals

## 2021-12-21 NOTE — Progress Notes (Signed)
Occupational Therapy Treatment Patient Details Name: Tonya Myers MRN: [id number] DOB: 06/30/1965 Today's Date: 12/21/2021   History of present illness 56 y.o. 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   OT comments  Upon entering session, pt resting in bed and agreeable to OT/PT co-treatment. Pt completed bed mobility with supervision, multiple STS from EOB with Mod-Max A x2 using RW, LB dressing with Max A, and seated grooming with Min A. Pt tolerated BUE AROM exercises while sitting at EOB. Pt was motivated to participate in therapy and is eager to go to STR in order to get stronger and return to PLOF. Pt is making progress toward goal completion. D/C recommendation remains appropriate. OT will continue to follow acutely.    Recommendations for follow up therapy are one component of a multi-disciplinary discharge planning process, led by the attending physician.  Recommendations may be updated based on patient status, additional functional criteria and insurance authorization.    Follow Up Recommendations  Skilled nursing-short term rehab (<3 hours/day)    Assistance Recommended at Discharge Frequent or constant Supervision/Assistance  Patient can return home with the following  A lot of help with walking and/or transfers;A lot of help with bathing/dressing/bathroom;Assistance with cooking/housework;Assist for transportation   Equipment Recommendations  Other (comment) (defer to next venue of care)    Recommendations for Other Services      Precautions / Restrictions Precautions Precautions:  Fall Precaution Comments: sacral wound Restrictions Weight Bearing Restrictions: No       Mobility Bed Mobility Overal bed mobility: Needs Assistance Bed Mobility: Supine to Sit, Sit to Supine     Supine to sit: Supervision Sit to supine: Supervision   General bed mobility comments: with increased time    Transfers Overall transfer level: Needs assistance Equipment used: Rolling walker (2 wheels) Transfers: Sit to/from Stand Sit to Stand: Max assist, Mod assist, +2 physical assistance           General transfer comment: STS (3x) from EOB with Mod-Max A x2 + RW     Balance Overall balance assessment: Needs assistance Sitting-balance support: Feet supported, Bilateral upper extremity supported Sitting balance-Leahy Scale: Good Sitting balance - Comments: supervision   Standing balance support: Reliant on assistive device for balance, During functional activity, Bilateral upper extremity supported Standing balance-Leahy Scale: Poor Standing balance comment: hand on assist +2 at all times.                           ADL either performed or assessed with clinical judgement   ADL Overall ADL's : Needs assistance/impaired     Grooming: Wash/dry face;Sitting;Minimal assistance;Oral care Grooming Details (indicate cue type and reason): Pt completed grooming tasks while sitting EOB, difficulty with FM control to open toothpaste cap         Upper Body Dressing : Moderate assistance Upper Body Dressing Details (indicate cue type and reason): Mod A to adjust gown and jacket Lower Body Dressing: Maximal assistance Lower Body Dressing Details (indicate cue type and reason): Max A to adjust socks sitting EOB  Extremity/Trunk Assessment Upper Extremity Assessment Upper Extremity Assessment: Generalized weakness RUE Deficits / Details: 3/5 gross strength and significant decreased grip strength   Lower Extremity Assessment Lower  Extremity Assessment: Generalized weakness        Vision Patient Visual Report: No change from baseline     Perception     Praxis      Cognition Arousal/Alertness: Awake/alert Behavior During Therapy: WFL for tasks assessed/performed Overall Cognitive Status: Within Functional Limits for tasks assessed                                          Exercises Other Exercises Other Exercises: BUE AROM exercises in sitting (10 each): shoulder flexion, seated punches, elbow flexion/extension, pronation/supination    Shoulder Instructions       General Comments      Pertinent Vitals/ Pain       Pain Assessment Pain Assessment: Faces Faces Pain Scale: Hurts little more Pain Location: pain in feet - neuropathy Pain Descriptors / Indicators: Aching, Discomfort Pain Intervention(s): Monitored during session, Repositioned, Limited activity within patient's tolerance  Home Living                                          Prior Functioning/Environment              Frequency  Min 2X/week        Progress Toward Goals  OT Goals(current goals can now be found in the care plan section)  Progress towards OT goals: Progressing toward goals  Acute Rehab OT Goals Patient Stated Goal: to return to PLOF and go to rehab OT Goal Formulation: With patient Time For Goal Achievement: 12/29/21 Potential to Achieve Goals: [place] Discharge plan remains appropriate;Frequency remains appropriate    Co-evaluation    PT/OT/SLP Co-Evaluation/Treatment: Yes Reason for Co-Treatment: For patient/therapist safety;To address functional/ADL transfers PT goals addressed during session: Mobility/safety with mobility;Balance;Proper use of DME;Strengthening/ROM OT goals addressed during session: ADL's and self-care;Proper use of Adaptive equipment and DME;Strengthening/ROM      AM-PAC OT "6 Clicks" Daily Activity     Outcome Measure   Help from another  person eating meals?: A Little Help from another person taking care of personal grooming?: A Little Help from another person toileting, which includes using toliet, bedpan, or urinal?: A Lot Help from another person bathing (including washing, rinsing, drying)?: A Lot Help from another person to put on and taking off regular upper body clothing?: A Little Help from another person to put on and taking off regular lower body clothing?: A Lot 6 Click Score: 15    End of Session Equipment Utilized During Treatment: Rolling walker (2 wheels);Gait belt  OT Visit Diagnosis: Unsteadiness on feet (R26.81);Muscle weakness (generalized) (M62.81)   Activity Tolerance Patient limited by fatigue;Patient limited by pain   Patient Left in bed;with call bell/phone within reach;with bed alarm set   Nurse Communication Mobility status        Time: 1448-1856 OT Time Calculation (min): 26 min  Charges: OT General Charges $OT Visit: 1 Visit OT Treatments $Self Care/Home Management : 8-22 mins  [facility] MS, OTR/L ascom [phone]  12/21/21, 4:42 PM

## 2021-12-21 NOTE — Progress Notes (Signed)
[facility]  ROUNDING NOTE   Subjective:   Tonya Myers is a 56 year old female with past medical history including depression, type 2 diabetes, asthma, hypertension, hypothyroidism, CLL, and end-stage renal disease on hemodialysis.   Patient seen and evaluated during dialysis   HEMODIALYSIS FLOWSHEET:  Blood Flow Rate (mL/min): 300 mL/min Arterial Pressure (mmHg): -80 mmHg Venous Pressure (mmHg): 130 mmHg TMP (mmHg): 17 mmHg Ultrafiltration Rate (mL/min): 882 mL/min Dialysate Flow Rate (mL/min): 300 ml/min Dialysis Fluid Bolus: Normal Saline Bolus Amount (mL): 300 mL  No concerns related to dialysis at this time.   Objective:  Vital signs in last 24 hours:  Temp:  [98.3 F (36.8 C)-99.1 F (37.3 C)] 98.3 F (36.8 C) (08/28 0850) Pulse Rate:  [61-79] 69 (08/28 1130) Resp:  [9-18] 10 (08/28 1130) BP: (137-188)/(76-137) 156/90 (08/28 1210) SpO2:  [96 %-100 %] 96 % (08/28 1210)  Weight change:  Filed Weights   12/18/21 0443 12/18/21 2100 12/20/21 0417  Weight: 74.7 kg 74.7 kg 75.3 kg     Intake/Output: I/O last 3 completed shifts: In: 120 [P.O.:120] Out: 75 [Urine:75]   Intake/Output this shift:  No intake/output data recorded.  Physical Exam: General: NAD  Head: Normocephalic, atraumatic. Moist oral mucosal membranes  Eyes: Anicteric  Lungs:  Clear to auscultation, normal effort  Heart: Regular rate and rhythm  Abdomen:  Soft, nontender  Extremities:  No peripheral edema.  Neurologic: Alert, moving upper extremities  Skin: No lesions  Access: Rt Permcath    Basic Metabolic Panel: Recent Labs  Lab 12/15/21 0618 12/16/21 0854 12/21/21 0859  NA 139 141 139  K 4.4 5.1 5.8*  CL 104 105 100  CO2 '26 23 26  '$ GLUCOSE 78 79 79  BUN 30* 48* 81*  CREATININE 3.53* 4.86* 5.24*  CALCIUM 8.9 9.3 9.6  PHOS  --   --  3.7     Liver Function Tests: Recent Labs  Lab 12/21/21 0859  ALBUMIN 3.0*   No results for input(s): "LIPASE",  "AMYLASE" in the last 168 hours.  No results for input(s): "AMMONIA" in the last 168 hours.  CBC: Recent Labs  Lab 12/15/21 0618 12/16/21 0854 12/21/21 0341 12/21/21 0900  WBC 3.3* 3.5* 4.2 4.3  NEUTROABS 1.3* 1.3*  --  1.9  HGB 7.8* 7.9* 7.8* 7.7*  HCT 24.0* 25.4* 25.0* 24.6*  MCV 94.9 94.4 95.8 97.2  PLT PLATELET CLUMPS NOTED ON SMEAR, UNABLE TO ESTIMATE 176 203 214     Cardiac Enzymes: No results for input(s): "CKTOTAL", "CKMB", "CKMBINDEX", "TROPONINI" in the last 168 hours.  BNP: Invalid input(s): "POCBNP"  CBG: Recent Labs  Lab 12/19/21 2107 12/20/21 0748 12/20/21 1151 12/20/21 1623 12/20/21 2058  GLUCAP 190* 124* 109* 160* 121*     Microbiology: Results for orders placed or performed during the hospital encounter of 12/12/21  Resp Panel by RT-PCR (Flu A&B, Covid) Anterior Nasal Swab     Status: None   Collection Time: 12/12/21  4:28 AM   Specimen: Anterior Nasal Swab  Result Value Ref Range Status   SARS Coronavirus 2 by RT PCR NEGATIVE NEGATIVE Final    Comment: (NOTE) SARS-CoV-2 target nucleic acids are NOT DETECTED.  The SARS-CoV-2 RNA is generally detectable in upper respiratory specimens during the acute phase of infection. The lowest concentration of SARS-CoV-2 viral copies this assay can detect is 138 copies/mL. A negative result does not preclude SARS-Cov-2 infection and should not be used as the sole basis for treatment or other patient management decisions. A  negative result may occur with  improper specimen collection/handling, submission of specimen other than nasopharyngeal swab, presence of viral mutation(s) within the areas targeted by this assay, and inadequate number of viral copies(<138 copies/mL). A negative result must be combined with clinical observations, patient history, and epidemiological information. The expected result is Negative.  Fact Sheet for Patients:  EntrepreneurPulse.com.au  Fact Sheet for  Healthcare Providers:  IncredibleEmployment.be  This test is no t yet approved or cleared by the [place] FDA and  has been authorized for detection and/or diagnosis of SARS-CoV-2 by FDA under an Emergency Use Authorization (EUA). This EUA will remain  in effect (meaning this test can be used) for the duration of the COVID-19 declaration under Section 564(b)(1) of the Act, 21 U.S.C.section 360bbb-3(b)(1), unless the authorization is terminated  or revoked sooner.       Influenza A by PCR NEGATIVE NEGATIVE Final   Influenza B by PCR NEGATIVE NEGATIVE Final    Comment: (NOTE) The Xpert Xpress SARS-CoV-2/FLU/RSV plus assay is intended as an aid in the diagnosis of influenza from Nasopharyngeal swab specimens and should not be used as a sole basis for treatment. Nasal washings and aspirates are unacceptable for Xpert Xpress SARS-CoV-2/FLU/RSV testing.  Fact Sheet for Patients: EntrepreneurPulse.com.au  Fact Sheet for Healthcare Providers: IncredibleEmployment.be  This test is not yet approved or cleared by the [place] FDA and has been authorized for detection and/or diagnosis of SARS-CoV-2 by FDA under an Emergency Use Authorization (EUA). This EUA will remain in effect (meaning this test can be used) for the duration of the COVID-19 declaration under Section 564(b)(1) of the Act, 21 U.S.C. section 360bbb-3(b)(1), unless the authorization is terminated or revoked.  Performed at [facility], [address] [place], [place] [address]   Gastrointestinal Panel by PCR , Stool     Status: None   Collection Time: 12/12/21  8:26 AM   Specimen: Stool  Result Value Ref Range Status   Campylobacter species NOT DETECTED NOT DETECTED Final   Plesimonas shigelloides NOT DETECTED NOT DETECTED Final   Salmonella species NOT DETECTED NOT DETECTED Final   Yersinia enterocolitica NOT DETECTED NOT DETECTED Final    Vibrio species NOT DETECTED NOT DETECTED Final   Vibrio cholerae NOT DETECTED NOT DETECTED Final   Enteroaggregative E coli (EAEC) NOT DETECTED NOT DETECTED Final   Enteropathogenic E coli (EPEC) NOT DETECTED NOT DETECTED Final   Enterotoxigenic E coli (ETEC) NOT DETECTED NOT DETECTED Final   Shiga like toxin producing E coli (STEC) NOT DETECTED NOT DETECTED Final   Shigella/Enteroinvasive E coli (EIEC) NOT DETECTED NOT DETECTED Final   Cryptosporidium NOT DETECTED NOT DETECTED Final   Cyclospora cayetanensis NOT DETECTED NOT DETECTED Final   Entamoeba histolytica NOT DETECTED NOT DETECTED Final   Giardia lamblia NOT DETECTED NOT DETECTED Final   Adenovirus F40/41 NOT DETECTED NOT DETECTED Final   Astrovirus NOT DETECTED NOT DETECTED Final   Norovirus GI/GII NOT DETECTED NOT DETECTED Final   Rotavirus A NOT DETECTED NOT DETECTED Final   Sapovirus (I, II, IV, and V) NOT DETECTED NOT DETECTED Final    Comment: Performed at [facility], [address] [place], [place] [address]  C Difficile Quick Screen w PCR reflex     Status: Abnormal   Collection Time: 12/12/21  8:26 AM   Specimen: STOOL  Result Value Ref Range Status   C Diff antigen POSITIVE (A) NEGATIVE Final   C Diff toxin NEGATIVE NEGATIVE Final   C  Diff interpretation Results are indeterminate. See PCR results.  Final    Comment: Performed at [facility], [address] [place], [place] [address]  C. Diff by PCR, Reflexed     Status: None   Collection Time: 12/12/21  8:26 AM  Result Value Ref Range Status   Toxigenic C. Difficile by PCR NEGATIVE NEGATIVE Final    Comment: Patient is colonized with non toxigenic C. difficile. May not need treatment unless significant symptoms are present. Performed at [facility], [address] [place], [place] [address]   Blood Culture (routine x 2)     Status: None   Collection Time: 12/12/21 10:16 PM   Specimen: BLOOD LEFT FOREARM  Result Value Ref Range  Status   Specimen Description BLOOD LEFT FOREARM  Final   Special Requests   Final    BOTTLES DRAWN AEROBIC AND ANAEROBIC Blood Culture adequate volume   Culture   Final    NO GROWTH 5 DAYS Performed at [facility], [address] [place], [place] [address]    Report Status 12/17/2021 FINAL  Final  Blood Culture (routine x 2)     Status: None   Collection Time: 12/12/21 10:27 PM   Specimen: BLOOD  Result Value Ref Range Status   Specimen Description BLOOD LEFT WRIST  Final   Special Requests   Final    BOTTLES DRAWN AEROBIC AND ANAEROBIC Blood Culture adequate volume   Culture   Final    NO GROWTH 5 DAYS Performed at [facility], [address], [place], [place] [address]    Report Status 12/17/2021 FINAL  Final    Coagulation Studies: No results for input(s): "LABPROT", "INR" in the last 72 hours.   Urinalysis: No results for input(s): "COLORURINE", "LABSPEC", "PHURINE", "GLUCOSEU", "HGBUR", "BILIRUBINUR", "KETONESUR", "PROTEINUR", "UROBILINOGEN", "NITRITE", "LEUKOCYTESUR" in the last 72 hours.  Invalid input(s): "APPERANCEUR"     Imaging: No results found.   Medications:       heparin sodium (porcine)       (feeding supplement) PROSource Plus  30 mL Oral TID BM   vitamin C  500 mg Oral BID   aspirin EC  81 mg Oral Daily   atorvastatin  20 mg Oral Daily   Chlorhexidine Gluconate Cloth  6 each Topical Daily   diclofenac Sodium  4 g Topical QID   epoetin (EPOGEN/PROCRIT) injection  4,000 Units Intravenous Q M,W,F-HD   feeding supplement  1 Container Oral TID BM   gabapentin  100 mg Oral Q M,W,F-1800   heparin  5,000 Units Subcutaneous Q8H   insulin aspart  0-5 Units Subcutaneous QHS   insulin aspart  0-9 Units Subcutaneous TID WC   lacosamide  100 mg Oral BID   lacosamide  50 mg Oral Q M,W,F-HD   leptospermum manuka honey  1 Application Topical Daily   levothyroxine  125 mcg Oral Q0600   lipase/protease/amylase  12,000 Units Oral TID  WC   losartan  50 mg Oral Daily   multivitamin  1 tablet Oral QHS   polyethylene glycol  17 g Oral BID   senna-docusate  2 tablet Oral QHS   sevelamer carbonate  800 mg Oral TID WC   valACYclovir  500 mg Oral Daily   zinc sulfate  220 mg Oral Daily   heparin sodium (porcine), acetaminophen, albuterol, dextromethorphan-guaiFENesin, diclofenac Sodium, fluticasone, hydrALAZINE, iohexol, ondansetron (ZOFRAN) IV, oxyCODONE-acetaminophen  Assessment/ Plan:  [name] is a 56 y.o.  female with past medical  history including depression, type 2 diabetes, asthma, hypertension, hypothyroidism, CLL, and end-stage renal disease on hemodialysis.  [facility]/MWF/right PermCath/66.4kg  End-stage renal disease on hemodialysis.    Patient receiving dialysis today, UF goal 2 L as tolerated.  Patient sustaining daily weight increase for documented weights.  We will request weight with lift to establish accurate weight and determined if new dry weight is needed.  Bed weights not accurate with dialysis patients.  Next dialysis treatment scheduled for Wednesday.  2. Anemia of chronic kidney disease Lab Results  Component Value Date   HGB 7.7 (L) 12/21/2021  Patient receives [place] outpatient.  Continue low-dose EPO with treatments.  3. Secondary Hyperparathyroidism: with outpatient labs: PTH 235, phosphorus 6.7, calcium 9.0 on 11/30/2021.   Lab Results  Component Value Date   CALCIUM 9.6 12/21/2021   PHOS 3.7 12/21/2021    We will continue to monitor bone minerals during this admission.  4. 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 Home regimen includes lispro. Most recent hemoglobin A1c is 4.9 on 12/09/2021.   Glucose acceptable.  Scale managed by primary team.  5.  Syncopal episode during dialysis treatment.  Cardiology consulted due to elevated troponins overnight.  Echo shows EF 55 to 60%.  Home cardiac monitor remains in place  from previous admission.    6.  Nausea/sacral wound/retained stool/C. difficile antigen positive. Management as per internal medicine team.  Completed antibiotic therapy. -Continue supportive care   LOS: 9 [place] 8/28/202312:22 PM

## 2021-12-21 NOTE — TOC Progression Note (Signed)
Transition of Care [facility]) - Progression Note    Patient Details  Name: Tonya Myers MRN: [id number] Date of Birth: May 22, 1965  Transition of Care [facility]) [name] [place], RN Phone Number: 12/21/2021, 3:00 PM  Clinical Narrative:    Christy Sartorius at [facility] at [phone], I provided her with the patient's PASR number and SS number, her dialysis will need to be transferred to [facility] or [place], I notified Estill Bamberg with patient [employer]   Expected Discharge Plan: [place] Barriers to Discharge: No SNF bed  Expected Discharge Plan and Services Expected Discharge Plan: [place] Choice: [place] arrangements for the past 2 months: Single Family Home                                       Social Determinants of Health (SDOH) Interventions    Readmission Risk Interventions    11/24/2020    2:17 PM  Readmission Risk Prevention Plan  Transportation Screening Complete  PCP or Specialist Appt within 3-5 Days Complete  HRI or Home Care Consult Complete  Social Work Consult for [place] Planning/Counseling Complete  Palliative Care Screening Not Applicable  Medication Review Press photographer) Complete

## 2021-12-21 NOTE — Progress Notes (Signed)
Pre HD RN assessment 

## 2021-12-21 NOTE — Progress Notes (Signed)
Post HD RN assessment 

## 2021-12-21 NOTE — Progress Notes (Signed)
Physical Therapy Treatment Patient Details Name: Tonya Myers MRN: [id number] DOB: [birth year] Today's Date: 12/21/2021   History of Present Illness 56 y.o. 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    PT Comments    Pt ready for session.  Co-tx for pt and staff safety.  To/from EOB with rail and supervision/time.  She is able to stand x  at EOB with RW and max a x 2 on first attempt, mod a x 2 on second.  Heavy assist for 1 sidestep along bed.  Remained sitting EOB for about 15 mintues during session with no LOB.   Recommendations for follow up therapy are one component of a multi-disciplinary discharge planning process, led by the attending physician.  Recommendations may be updated based on patient status, additional functional criteria and insurance authorization.  Follow Up Recommendations  Skilled nursing-short term rehab (<3 hours/day)     Assistance Recommended at Discharge Frequent or constant Supervision/Assistance  Patient can return home with the following Two people to help with walking and/or transfers;Two people to help with bathing/dressing/bathroom;Assistance with cooking/housework;Assist for transportation   Equipment Recommendations       Recommendations for Other Services       Precautions / Restrictions Precautions Precautions: Fall Precaution Comments: sacral wound Restrictions Weight Bearing Restrictions: No     Mobility  Bed Mobility Overal bed mobility: Needs Assistance Bed Mobility: Supine to Sit, Sit to Supine Rolling: Supervision   Supine to sit: Supervision Sit to  supine: Supervision   General bed mobility comments: able to get leg up on bed on her own today with time.    Transfers Overall transfer level: Needs assistance Equipment used: Rolling walker (2 wheels)   Sit to Stand: Max assist, Mod assist, +2 physical assistance                Ambulation/Gait Ambulation/Gait assistance: Mod assist, Max assist, +2 physical assistance Gait Distance (Feet): 1 Feet Assistive device: Rolling walker (2 wheels) Gait Pattern/deviations: Step-to pattern Gait velocity: dec     General Gait Details: one side step along bed before needing to sit   Stairs             Wheelchair Mobility    Modified Rankin (Stroke Patients Only)       Balance Overall balance assessment: Needs assistance Sitting-balance support: Feet supported, Bilateral upper extremity supported Sitting balance-Leahy Scale: Good     Standing balance support: Reliant on assistive device for balance, During functional activity, Bilateral upper extremity supported Standing balance-Leahy Scale: Poor Standing balance comment: hand on assist +2 at all times.                            Cognition Arousal/Alertness: Awake/alert Behavior During Therapy: WFL for tasks assessed/performed Overall Cognitive Status: Within Functional Limits for tasks assessed                                 General Comments: well versed in PT and will request order and activity.        Exercises Other Exercises Other  Exercises: seated AROM in sitting, and AAROM supine    General Comments        Pertinent Vitals/Pain Pain Assessment Pain Assessment: Faces Faces Pain Scale: Hurts little more Pain Location: pain in feet - neuropathy Pain Descriptors / Indicators: Aching, Discomfort Pain Intervention(s): Monitored during session, Repositioned    Home Living                          Prior Function            PT Goals (current goals can now  be found in the care plan section) Progress towards PT goals: Progressing toward goals    Frequency    Min 2X/week      PT Plan Current plan remains appropriate    Co-evaluation              AM-PAC PT "6 Clicks" Mobility   Outcome Measure  Help needed turning from your back to your side while in a flat bed without using bedrails?: A Little Help needed moving from lying on your back to sitting on the side of a flat bed without using bedrails?: A Little Help needed moving to and from a bed to a chair (including a wheelchair)?: Total Help needed standing up from a chair using your arms (e.g., wheelchair or bedside chair)?: A Lot Help needed to walk in hospital room?: Total Help needed climbing 3-5 steps with a railing? : Total 6 Click Score: 11    End of Session Equipment Utilized During Treatment: Gait belt Activity Tolerance: Patient tolerated treatment well Patient left: with bed alarm set;with call bell/phone within reach;in bed Nurse Communication: Mobility status PT Visit Diagnosis: Muscle weakness (generalized) (M62.81);Difficulty in walking, not elsewhere classified (R26.2)     Time: 0232-0306 PT Time Calculation (min) (ACUTE ONLY): 34 min  Charges:  $Therapeutic Exercise: 8-22 mins                   Chesley Noon, PTA 12/21/21, 3:41 PM

## 2021-12-22 DIAGNOSIS — R531 Weakness: Secondary | ICD-10-CM | POA: Diagnosis not present

## 2021-12-22 DIAGNOSIS — N186 End stage renal disease: Secondary | ICD-10-CM | POA: Diagnosis not present

## 2021-12-22 DIAGNOSIS — R197 Diarrhea, unspecified: Secondary | ICD-10-CM | POA: Diagnosis not present

## 2021-12-22 DIAGNOSIS — R55 Syncope and collapse: Secondary | ICD-10-CM | POA: Diagnosis not present

## 2021-12-22 DIAGNOSIS — D631 Anemia in chronic kidney disease: Secondary | ICD-10-CM | POA: Diagnosis not present

## 2021-12-22 LAB — GLUCOSE, CAPILLARY
Glucose-Capillary: 77 mg/dL (ref 70–99)
Glucose-Capillary: 101 mg/dL — ABNORMAL HIGH (ref 70–99)
Glucose-Capillary: 145 mg/dL — ABNORMAL HIGH (ref 70–99)
Glucose-Capillary: 111 mg/dL — ABNORMAL HIGH (ref 70–99)

## 2021-12-22 LAB — RENAL FUNCTION PANEL
Albumin: 2.9 g/dL — ABNORMAL LOW (ref 3.5–5.0)
Anion gap: 11 (ref 5–15)
BUN: 53 mg/dL — ABNORMAL HIGH (ref 6–20)
CO2: 26 mmol/L (ref 22–32)
Calcium: 9.3 mg/dL (ref 8.9–10.3)
Chloride: 102 mmol/L (ref 98–111)
Creatinine, Ser: 4.13 mg/dL — ABNORMAL HIGH (ref 0.44–1.00)
GFR, Estimated: 12 mL/min — ABNORMAL LOW (ref >60)
Glucose, Bld: 104 mg/dL — ABNORMAL HIGH (ref 70–99)
Phosphorus: 3.7 mg/dL (ref 2.5–4.6)
Potassium: 5.3 mmol/L — ABNORMAL HIGH (ref 3.5–5.1)
Sodium: 139 mmol/L (ref 135–145)

## 2021-12-22 MED ORDER — GABAPENTIN 100 MG PO CAPS: 100.0000 mg | ORAL_CAPSULE | ORAL | Status: DC

## 2021-12-22 MED ORDER — LIDOCAINE 5 % EX PTCH
1.0000 | MEDICATED_PATCH | Freq: Once | CUTANEOUS | Status: AC
Start: 2021-12-22 — End: 2021-12-23
  Administered 2021-12-23: 1 via TRANSDERMAL
  Filled 2021-12-22: qty 1

## 2021-12-22 MED ORDER — SENNOSIDES-DOCUSATE SODIUM 8.6-50 MG PO TABS: 2.0000 | ORAL_TABLET | Freq: Every day | ORAL | Status: DC

## 2021-12-22 MED ORDER — RENA-VITE PO TABS: 1.0000 | ORAL_TABLET | Freq: Every day | ORAL | 0 refills | Status: DC

## 2021-12-22 MED ORDER — ASCORBIC ACID 500 MG PO TABS: 500.0000 mg | ORAL_TABLET | Freq: Two times a day (BID) | ORAL | Status: DC

## 2021-12-22 MED ORDER — PANCRELIPASE (LIP-PROT-AMYL) 12000-38000 UNITS PO CPEP: 12000.0000 [IU] | ORAL_CAPSULE | Freq: Three times a day (TID) | ORAL | Status: DC

## 2021-12-22 MED ORDER — ZINC SULFATE 220 (50 ZN) MG PO CAPS: 220.0000 mg | ORAL_CAPSULE | Freq: Every day | ORAL | Status: AC

## 2021-12-22 NOTE — Progress Notes (Signed)
Physical Therapy Treatment Patient Details Name: Tonya Myers MRN: [id number] DOB: 09-Mar-1966 Today's Date: 12/22/2021   History of Present Illness 56 y.o. 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    PT Comments    Pt is well known by author from previous admission. She is A and O x 4 and motivated today to participate. She states," I've been feeling better and better every day." She agrees to OOB activity. Was able to achieve EOB short sit from supine without physical assistance. Does take increased time, use of bed rail, and uses her UEs to assist LEs to EOB. Sat EOB with supervision x ~ 20 minutes throughout session. She fatigues quickly with prolonged seated rest between standing trials. She stood 3 x from elevated bed height > RW with RUE place on RW prior to standing and LUE pushing from bed. Stood each time ~ 1-2 minutes and on 2nd/3rd attempt, was able to march in place. Author encourage attempting ambulation away from EOB however pt unwilling. " I'm too tired now." Acute PT will continue to follow and progress as able per current POC. Highly recommend DC to SNF to address deficits while maximizing independence with all ADLs.     Recommendations for follow up therapy are one component of a multi-disciplinary discharge planning process, led by the attending physician.  Recommendations may be updated based on patient status, additional functional criteria and insurance authorization.  Follow Up Recommendations  Skilled nursing-short term rehab (<3 hours/day)     Assistance Recommended at Discharge Frequent or  constant Supervision/Assistance  Patient can return home with the following A lot of help with walking and/or transfers;A lot of help with bathing/dressing/bathroom;Assistance with cooking/housework;Assist for transportation;Help with stairs or ramp for entrance   Equipment Recommendations  Other (comment) (defer to next level of care)       Precautions / Restrictions Precautions Precautions: Fall Precaution Comments: sacral wound Restrictions Weight Bearing Restrictions: No     Mobility  Bed Mobility Overal bed mobility: Needs Assistance Bed Mobility: Supine to Sit, Sit to Supine Rolling: Supervision   Supine to sit: Supervision Sit to supine: Supervision   General bed mobility comments: increased time to perform however no physical assistance require.    Transfers Overall transfer level: Needs assistance Equipment used: Rolling walker (2 wheels) Transfers: Sit to/from Stand Sit to Stand: Mod assist, From elevated surface        General transfer comment: Pt was able to stand 3 x from elevated bed height to RW. RUE on RW and LUE pushing off bed. Tolerated standing  3 x ~ 1-2 minutes each time. She was able to perform marching in place aletnating 20 x(10 each LE). Author encouraged pt attempt ambulation away form EOB however pt requested to stop session due to fatigue after 3 standing trial.    Ambulation/Gait Ambulation/Gait assistance: Min assist Gait Distance (Feet): 3 Feet Assistive device: Rolling walker (2 wheels) Gait Pattern/deviations: Step-to pattern Gait velocity: decreased     General Gait Details: pt was able to take several step along EOB but unwilling to ambulate away from EOB. foot drop noted + ataxic step quality     Balance Overall balance assessment:  Needs assistance Sitting-balance support: Feet supported, Bilateral upper extremity supported Sitting balance-Leahy Scale: Good     Standing balance support: Reliant on assistive device for balance,  During functional activity, Bilateral upper extremity supported Standing balance-Leahy Scale: Fair Standing balance comment: static standing good (CGA/supervision) dynamic fair (min A)      Cognition Arousal/Alertness: Awake/alert Behavior During Therapy: WFL for tasks assessed/performed Overall Cognitive Status: Within Functional Limits for tasks assessed            General Comments General comments (skin integrity, edema, etc.): discussed AFO for foot drop and encouraged pt to continue to progress with OOB activity daily. She demonstrated exercises she has been performing to improve functional strength.      Pertinent Vitals/Pain Pain Assessment Pain Assessment:  (" I have neuropathy but its not hurting as bad today.") Pain Location: pain in feet - neuropathy Pain Descriptors / Indicators: Aching, Discomfort Pain Intervention(s): Limited activity within patient's tolerance, Monitored during session, Premedicated before session, Repositioned     PT Goals (current goals can now be found in the care plan section) Acute Rehab PT Goals Patient Stated Goal: rehab then home Progress towards PT goals: Progressing toward goals    Frequency    Min 2X/week      PT Plan Current plan remains appropriate    Co-evaluation     PT goals addressed during session: Mobility/safety with mobility;Balance;Proper use of DME;Strengthening/ROM        AM-PAC PT "6 Clicks" Mobility   Outcome Measure  Help needed turning from your back to your side while in a flat bed without using bedrails?: A Little Help needed moving from lying on your back to sitting on the side of a flat bed without using bedrails?: A Little Help needed moving to and from a bed to a chair (including a wheelchair)?: A Lot Help needed standing up from a chair using your arms (e.g., wheelchair or bedside chair)?: A Lot Help needed to walk in hospital room?: A Lot Help needed climbing 3-5 steps with a railing? : Total 6  Click Score: 13    End of Session Equipment Utilized During Treatment: Gait belt Activity Tolerance: Patient tolerated treatment well Patient left: in bed;with call bell/phone within reach;with bed alarm set;with family/visitor present;with nursing/sitter in room Nurse Communication: Mobility status PT Visit Diagnosis: Muscle weakness (generalized) (M62.81);Difficulty in walking, not elsewhere classified (R26.2)     Time: 1355-1420 PT Time Calculation (min) (ACUTE ONLY): 25 min  Charges:  $Therapeutic Activity: 23-37 mins                    Julaine Fusi PTA 12/22/21, 2:55 PM

## 2021-12-22 NOTE — Progress Notes (Signed)
   12/21/21 1100 12/21/21 1130 12/21/21 1200  Vitals  Temp  --   --   --   Temp Source  --   --   --   BP (!) 170/91 (!) 157/81  --   MAP (mmHg) 114 103  --   BP Location Left Arm Left Arm Left Arm  BP Method Automatic Automatic Automatic  Patient Position (if appropriate) Lying Lying Lying  Pulse Rate 62 69  --   Pulse Rate Source Monitor Monitor Monitor  ECG Heart Rate 64 77  --   Resp 11 10  --   During Treatment Monitoring  Blood Flow Rate (mL/min) 300 mL/min 300 mL/min  --   Arterial Pressure (mmHg) -80 mmHg -80 mmHg  --   Venous Pressure (mmHg) 120 mmHg 110 mmHg  --   TMP (mmHg) 11 mmHg 17 mmHg  --   Ultrafiltration Rate (mL/min) 829 mL/min 829 mL/min  --   Dialysate Flow Rate (mL/min) 300 ml/min 300 ml/min  --   HD Safety Checks Performed Yes Yes  --   Intra-Hemodialysis Comments Progressing as prescribed Progressing as prescribed  --     12/21/21 1210 12/21/21 1230 12/21/21 1251  Vitals  Temp  --   --  98.3 F (36.8 C)  Temp Source  --   --  Oral (Simultaneous filing. User may not have seen previous data.)  BP (!) 156/90 (!) 168/100 (!) 155/139  MAP (mmHg) 107 119 146  BP Location Left Arm Left Arm Left Arm (Simultaneous filing. User may not have seen previous data.)  BP Method Automatic Automatic Automatic (Simultaneous filing. User may not have seen previous data.)  Patient Position (if appropriate) Lying Lying Lying (Simultaneous filing. User may not have seen previous data.)  Pulse Rate  --  79 72  Pulse Rate Source Monitor Monitor Monitor (Simultaneous filing. User may not have seen previous data.)  ECG Heart Rate  --  74 72  Resp  --  11 11  During Treatment Monitoring  Blood Flow Rate (mL/min) 300 mL/min 300 mL/min 300 mL/min  Arterial Pressure (mmHg) -80 mmHg -80 mmHg -80 mmHg  Venous Pressure (mmHg) 130 mmHg 140 mmHg 130 mmHg  TMP (mmHg) 17 mmHg 17 mmHg 18 mmHg  Ultrafiltration Rate (mL/min) 882 mL/min 882 mL/min 882 mL/min  Dialysate Flow Rate  (mL/min) 300 ml/min 300 ml/min 300 ml/min  HD Safety Checks Performed Yes Yes Yes  Intra-Hemodialysis Comments Progressing as prescribed (Pt alert, on phone, ufr [phone]) Progressing as prescribed Tx completed (Simultaneous filing. User may not have seen previous data.)

## 2021-12-22 NOTE — Progress Notes (Signed)
Nutrition Follow-up  DOCUMENTATION CODES:   Non-severe (moderate) malnutrition in context of chronic illness  INTERVENTION:   -Continue Renal MVI daily -Continue 30 ml Prosource Plus TID, each supplement provides 100 kcals and 15 grams protein -Continue Boost Breeze po TID, each supplement provides 250 kcal and 9 grams of protein  -Continue 500 mg vitamin C BID -Continue 220 mg zinc sulfate daily x 14 days -Continue double protein portions at meals  NUTRITION DIAGNOSIS:   Moderate Malnutrition related to chronic illness (ESRD on HD) as evidenced by mild fat depletion, moderate fat depletion, mild muscle depletion, moderate muscle depletion, percent weight loss.  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  8/23- EEG WDL  Reviewed I/O's: -1.8 L x 24 hours and -1.8 L since admission  UOP: 80 ml x 24 hours   Pt with improved oral intake. Noted meal completions 50-100%. Pt tsking supplements about 50% of the time.   Medications reviewed and include miralax, senokot, and renvela.   Per TOC notes, pt awaiting SNF placement as well as transfer of HD center.   Labs reviewed: K: 5.8, CBGS: 77-114 (inpatient orders for glycemic control are none).    Diet Order:   Diet Order             Diet Carb Modified Fluid consistency: Thin; Room service appropriate?  Yes  Diet effective now                   EDUCATION NEEDS:   Education needs have been addressed  Skin:  Skin Assessment: Skin Integrity Issues: Skin Integrity Issues:: Stage III Stage II: - Stage III: sacrum (healing per CWOCN note on 12/11/21)  Last BM:  12/20/21  Height:   Ht Readings from Last 1 Encounters:  12/18/21 6' (1.829 m)    Weight:   Wt Readings from Last 1 Encounters:  12/22/21 75.2 kg    Ideal Body Weight:  78.2 kg  BMI:  Body mass index is 22.48 kg/m.  Estimated Nutritional Needs:   Kcal:  2200-2400  Protein:  125-150 grams  Fluid:  1000 ml + UOP    Loistine Chance, RD, LDN, [place] Registered Dietitian II Certified Diabetes Care and Education Specialist Please refer to [facility] for RD and/or RD on-call/weekend/after hours pager

## 2021-12-22 NOTE — TOC Progression Note (Addendum)
Transition of Care [facility]) - Progression Note    Patient Details  Name: Tonya Myers MRN: [id number] Date of Birth: 28-Jul-1965  Transition of Care [facility]) [name] [place], RN Phone Number: 12/22/2021, 10:58 AM  Clinical Narrative:    [facility] not able to accept the patient for Dialysis, I called [facility] and spoke with Jinny Sanders and she stated they she will double check but they are pretty sure they can transport to [facility], [facility] is able to transport to [facility] for dialysis and Estill Bamberg with Patient pathways is working on getting a chair time, Will DC once Chair time is obtained   Expected Discharge Plan: Skilled Nursing Facility Barriers to Discharge: No SNF bed  Expected Discharge Plan and Services Expected Discharge Plan: [place]: [place] arrangements for the past 2 months: Single Family Home                                       Social Determinants of Health (SDOH) Interventions    Readmission Risk Interventions    11/24/2020    2:17 PM  Readmission Risk Prevention Plan  Transportation Screening Complete  PCP or Specialist Appt within 3-5 Days Complete  HRI or Home Care Consult Complete  Social Work Consult for [place] Planning/Counseling Complete  Palliative Care Screening Not Applicable  Medication Review [employer]) Complete

## 2021-12-22 NOTE — Progress Notes (Addendum)
Update: [facility] is not accepting patients at this time, sending referral to [facility].  Working on transfer to [facility].

## 2021-12-22 NOTE — Progress Notes (Signed)
[facility]  ROUNDING NOTE   Subjective:   Tonya Myers is a 56 year old female with past medical history including depression, type 2 diabetes, asthma, hypertension, hypothyroidism, CLL, and end-stage renal disease on hemodialysis.   Patient seen resting quietly Alert and oriented Currently denies pain and discomfort Independently moving right leg  Objective:  Vital signs in last 24 hours:  Temp:  [97.7 F (36.5 C)-98.6 F (37 C)] 98.1 F (36.7 C) (08/29 0728) Pulse Rate:  [61-71] 70 (08/29 0728) Resp:  [16-18] 16 (08/29 0728) BP: (132-154)/(84-90) 132/90 (08/29 0728) SpO2:  [100 %] 100 % (08/29 0728) Weight:  [75.2 kg] 75.2 kg (08/29 0508)  Weight change:  Filed Weights   12/18/21 2100 12/20/21 0417 12/22/21 0508  Weight: 74.7 kg 75.3 kg 75.2 kg     Intake/Output: I/O last 3 completed shifts: In: 236 [P.O.:236] Out: 2130 [Urine:130; Other:2000]   Intake/Output this shift:  Total I/O In: 480 [P.O.:480] Out: 100 [Urine:100]  Physical Exam: General: NAD  Head: Normocephalic, atraumatic. Moist oral mucosal membranes  Eyes: Anicteric  Lungs:  Clear to auscultation, normal effort  Heart: Regular rate and rhythm  Abdomen:  Soft, nontender  Extremities:  No peripheral edema.  Neurologic: Alert, moving upper extremities  Skin: No lesions  Access: Rt Permcath    Basic Metabolic Panel: Recent Labs  Lab 12/16/21 0854 12/21/21 0859 12/22/21 1142  NA 141 139 139  K 5.1 5.8* 5.3*  CL 105 100 102  CO2 '23 26 26  '$ GLUCOSE 79 79 104*  BUN 48* 81* 53*  CREATININE 4.86* 5.24* 4.13*  CALCIUM 9.3 9.6 9.3  PHOS  --  3.7 3.7     Liver Function Tests: Recent Labs  Lab 12/21/21 0859 12/22/21 1142  ALBUMIN 3.0* 2.9*    No results for input(s): "LIPASE", "AMYLASE" in the last 168 hours.  No results for input(s): "AMMONIA" in the last 168 hours.  CBC: Recent Labs  Lab 12/16/21 0854 12/21/21 0341 12/21/21 0900  WBC 3.5* 4.2 4.3  NEUTROABS  1.3*  --  1.9  HGB 7.9* 7.8* 7.7*  HCT 25.4* 25.0* 24.6*  MCV 94.4 95.8 97.2  PLT 176 203 214     Cardiac Enzymes: No results for input(s): "CKTOTAL", "CKMB", "CKMBINDEX", "TROPONINI" in the last 168 hours.  BNP: Invalid input(s): "POCBNP"  CBG: Recent Labs  Lab 12/20/21 2058 12/21/21 1831 12/21/21 2054 12/22/21 0731 12/22/21 1216  GLUCAP 121* 114* 99 1 101*     Microbiology: Results for orders placed or performed during the hospital encounter of 12/12/21  Resp Panel by RT-PCR (Flu A&B, Covid) Anterior Nasal Swab     Status: None   Collection Time: 12/12/21  4:28 AM   Specimen: Anterior Nasal Swab  Result Value Ref Range Status   SARS Coronavirus 2 by RT PCR NEGATIVE NEGATIVE Final    Comment: (NOTE) SARS-CoV-2 target nucleic acids are NOT DETECTED.  The SARS-CoV-2 RNA is generally detectable in upper respiratory specimens during the acute phase of infection. The lowest concentration of SARS-CoV-2 viral copies this assay can detect is 138 copies/mL. A negative result does not preclude SARS-Cov-2 infection and should not be used as the sole basis for treatment or other patient management decisions. A negative result may occur with  improper specimen collection/handling, submission of specimen other than nasopharyngeal swab, presence of viral mutation(s) within the areas targeted by this assay, and inadequate number of viral copies(<138 copies/mL). A negative result must be combined with clinical observations, patient history, and epidemiological  information. The expected result is Negative.  Fact Sheet for Patients:  EntrepreneurPulse.com.au  Fact Sheet for Healthcare Providers:  IncredibleEmployment.be  This test is no t yet approved or cleared by the [place] FDA and  has been authorized for detection and/or diagnosis of SARS-CoV-2 by FDA under an Emergency Use Authorization (EUA). This EUA will remain  in effect  (meaning this test can be used) for the duration of the COVID-19 declaration under Section 564(b)(1) of the Act, 21 U.S.C.section 360bbb-3(b)(1), unless the authorization is terminated  or revoked sooner.       Influenza A by PCR NEGATIVE NEGATIVE Final   Influenza B by PCR NEGATIVE NEGATIVE Final    Comment: (NOTE) The Xpert Xpress SARS-CoV-2/FLU/RSV plus assay is intended as an aid in the diagnosis of influenza from Nasopharyngeal swab specimens and should not be used as a sole basis for treatment. Nasal washings and aspirates are unacceptable for Xpert Xpress SARS-CoV-2/FLU/RSV testing.  Fact Sheet for Patients: EntrepreneurPulse.com.au  Fact Sheet for Healthcare Providers: IncredibleEmployment.be  This test is not yet approved or cleared by the [place] FDA and has been authorized for detection and/or diagnosis of SARS-CoV-2 by FDA under an Emergency Use Authorization (EUA). This EUA will remain in effect (meaning this test can be used) for the duration of the COVID-19 declaration under Section 564(b)(1) of the Act, 21 U.S.C. section 360bbb-3(b)(1), unless the authorization is terminated or revoked.  Performed at [facility], [address] [place], [place] [address]   Gastrointestinal Panel by PCR , Stool     Status: None   Collection Time: 12/12/21  8:26 AM   Specimen: Stool  Result Value Ref Range Status   Campylobacter species NOT DETECTED NOT DETECTED Final   Plesimonas shigelloides NOT DETECTED NOT DETECTED Final   Salmonella species NOT DETECTED NOT DETECTED Final   Yersinia enterocolitica NOT DETECTED NOT DETECTED Final   Vibrio species NOT DETECTED NOT DETECTED Final   Vibrio cholerae NOT DETECTED NOT DETECTED Final   Enteroaggregative E coli (EAEC) NOT DETECTED NOT DETECTED Final   Enteropathogenic E coli (EPEC) NOT DETECTED NOT DETECTED Final   Enterotoxigenic E coli (ETEC) NOT DETECTED NOT DETECTED Final    Shiga like toxin producing E coli (STEC) NOT DETECTED NOT DETECTED Final   Shigella/Enteroinvasive E coli (EIEC) NOT DETECTED NOT DETECTED Final   Cryptosporidium NOT DETECTED NOT DETECTED Final   Cyclospora cayetanensis NOT DETECTED NOT DETECTED Final   Entamoeba histolytica NOT DETECTED NOT DETECTED Final   Giardia lamblia NOT DETECTED NOT DETECTED Final   Adenovirus F40/41 NOT DETECTED NOT DETECTED Final   Astrovirus NOT DETECTED NOT DETECTED Final   Norovirus GI/GII NOT DETECTED NOT DETECTED Final   Rotavirus A NOT DETECTED NOT DETECTED Final   Sapovirus (I, II, IV, and V) NOT DETECTED NOT DETECTED Final    Comment: Performed at [facility], [address] [place], [place] [address]  C Difficile Quick Screen w PCR reflex     Status: Abnormal   Collection Time: 12/12/21  8:26 AM   Specimen: STOOL  Result Value Ref Range Status   C Diff antigen POSITIVE (A) NEGATIVE Final   C Diff toxin NEGATIVE NEGATIVE Final   C Diff interpretation Results are indeterminate. See PCR results.  Final    Comment: Performed at [facility], [address] [place], [place] [address]  C. Diff by PCR, Reflexed     Status: None   Collection Time: 12/12/21  8:26 AM  Result Value Ref Range Status   Toxigenic C. Difficile by PCR NEGATIVE NEGATIVE Final    Comment: Patient is colonized with non toxigenic C. difficile. May not need treatment unless significant symptoms are present. Performed at [facility], [address] [place], [place] [address]   Blood Culture (routine x 2)     Status: None   Collection Time: 12/12/21 10:16 PM   Specimen: BLOOD LEFT FOREARM  Result Value Ref Range Status   Specimen Description BLOOD LEFT FOREARM  Final   Special Requests   Final    BOTTLES DRAWN AEROBIC AND ANAEROBIC Blood Culture adequate volume   Culture   Final    NO GROWTH 5 DAYS Performed at [facility], [address], [place], [place] [address]    Report Status  12/17/2021 FINAL  Final  Blood Culture (routine x 2)     Status: None   Collection Time: 12/12/21 10:27 PM   Specimen: BLOOD  Result Value Ref Range Status   Specimen Description BLOOD LEFT WRIST  Final   Special Requests   Final    BOTTLES DRAWN AEROBIC AND ANAEROBIC Blood Culture adequate volume   Culture   Final    NO GROWTH 5 DAYS Performed at [facility], [address], [place], [place] [address]    Report Status 12/17/2021 FINAL  Final    Coagulation Studies: No results for input(s): "LABPROT", "INR" in the last 72 hours.   Urinalysis: No results for input(s): "COLORURINE", "LABSPEC", "PHURINE", "GLUCOSEU", "HGBUR", "BILIRUBINUR", "KETONESUR", "PROTEINUR", "UROBILINOGEN", "NITRITE", "LEUKOCYTESUR" in the last 72 hours.  Invalid input(s): "APPERANCEUR"     Imaging: No results found.   Medications:       (feeding supplement) PROSource Plus  30 mL Oral TID BM   vitamin C  500 mg Oral BID   aspirin EC  81 mg Oral Daily   atorvastatin  20 mg Oral Daily   Chlorhexidine Gluconate Cloth  6 each Topical Daily   diclofenac Sodium  4 g Topical QID   epoetin (EPOGEN/PROCRIT) injection  4,000 Units Intravenous Q M,W,F-HD   feeding supplement  1 Container Oral TID BM   gabapentin  100 mg Oral Q M,W,F-1800   heparin  5,000 Units Subcutaneous Q8H   insulin aspart  0-5 Units Subcutaneous QHS   insulin aspart  0-9 Units Subcutaneous TID WC   lacosamide  100 mg Oral BID   lacosamide  50 mg Oral Q M,W,F-HD   leptospermum manuka honey  1 Application Topical Daily   levothyroxine  125 mcg Oral Q0600   lipase/protease/amylase  12,000 Units Oral TID WC   losartan  50 mg Oral Daily   multivitamin  1 tablet Oral QHS   polyethylene glycol  17 g Oral BID   senna-docusate  2 tablet Oral QHS   sevelamer carbonate  800 mg Oral TID WC   valACYclovir  500 mg Oral Daily   zinc sulfate  220 mg Oral Daily   acetaminophen, albuterol, dextromethorphan-guaiFENesin, diclofenac  Sodium, fluticasone, hydrALAZINE, iohexol, ondansetron (ZOFRAN) IV, oxyCODONE-acetaminophen  Assessment/ Plan:  [name] is a 56 y.o.  female with past medical history including depression, type 2 diabetes, asthma, hypertension, hypothyroidism, CLL, and end-stage renal disease on hemodialysis.  [facility] [place]/MWF/right PermCath/66.4kg  End-stage renal disease on hemodialysis.      Next dialysis treatment scheduled for Wednesday.  Discharge plan to include rehab, awaiting outpatient dialysis transferred to [name] clinic.  2. Anemia of chronic kidney disease Lab  Results  Component Value Date   HGB 7.7 (L) 12/21/2021  Patient receives [place] outpatient.  Will increase to EPO 10,000 units IV with treatments.  3. Secondary Hyperparathyroidism: with outpatient labs: PTH 235, phosphorus 6.7, calcium 9.0 on 11/30/2021.   Lab Results  Component Value Date   CALCIUM 9.3 12/22/2021   PHOS 3.7 12/22/2021  Calcium and phosphorus within acceptable range.  4. 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 Home regimen includes lispro. Most recent hemoglobin A1c is 4.9 on 12/09/2021.   Glucose stable for this patient.  Sliding scale managed by primary team.  5.  Syncopal episode during dialysis treatment.  Cardiology consulted due to elevated troponins overnight.  Echo shows EF 55 to 60%.  Home cardiac monitor remains in place from previous admission.  Patient will continue to follow-up with cardiology at discharge.  6.  Nausea/sacral wound/retained stool/C. difficile antigen positive. Management as per internal medicine team.  Completed antibiotic therapy. -Continue supportive care   LOS: 10 [place] 8/29/20233:40 PM

## 2021-12-22 NOTE — Progress Notes (Signed)
PROGRESS NOTE    Tonya Myers  IZT:[id number] DOB: 03-Jul-1965 DOA: 12/12/2021 PCP: [name], MD    Brief Narrative:  56 y.o. 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    Pt was recently hospitalized from 8/16 - 8/18 due to syncope.  Per report, dialysis staff performed 2 minutes of CPR prior to arrival to [facility]. Pt had elevated trop up to  452.  Cardiology was consulted, suspected that her syncopal episode was vasovagal/hypotensive in nature, and troponin elevation was demand ischemia in that setting. 2 D echo on 8/17 showed LVEF of 55-60% without WMA's or other valvular pathologies. Pt also has had UTI and was started on Cipro at the discharge.    Patient states that after she went home, she has worsening weakness.  She has fever and chills.  She continues to have symptoms of UTI, including dysuria, burning on urination and urinary frequency.  She also reports nausea, diarrhea and mild lower abdominal pain.  No vomiting.  Patient has had 5 times of diarrhea.   C. difficile assay negative.  Antigen positive toxin negative.  Diarrhea has resolved.  No clinical indications of acute C. difficile infection.  Sacral decubitus ulcer has been evaluated by surgery.  No surgical intervention warranted.  Nephrology following for inpatient hemodialysis needs.  8/23: Rapid response called last night due to decreased level of responsiveness and bilateral lower extremity weakness.  Code stroke called.  CT head negative.  Patient offered MRI but refused.  Stated she was simply tired.  When evaluated the following morning no focal  neurologic deficit noted however patient is noted to have slow responses and be somewhat tremulous.  Discussed with nephrology.  Possible effect from pregabalin.  Dose decreased to renal dose of 75 mg nightly.  Patient requested dc of lyrica and attempt at gabapentin.  Seems to be clinically improved on 8/25  Barrier to discharge is acceptance to dialysis center in [place].  Elvera Bicker, HD coordinator is working to get patient a chair time.  Once this is secured patient can discharge to skilled nursing facility in [place] area   Assessment & Plan:   Principal Problem:   Diarrhea Active Problems:   Stercoral colitis   Sacral wound   UTI (urinary tract infection)   End-stage renal disease on hemodialysis ([place])   Essential hypertension   Hypothyroidism   Dyslipidemia   Myocardial injury   Type II diabetes mellitus with renal manifestations ([place])   Anemia in ESRD (end-stage renal disease) ([facility])   Chronic pancreatitis ([facility])   Asthma   Closed fracture of sternum   Transient neurologic deficit 12/15/21   History of herpes genitalis   Malnutrition of moderate degree  Decreased LOC  weakness Tremulousness Rapid response called 8/22 for decreased level responsiveness and weakness.  Also associated with decreased grip strength.  Unclear etiology.  CT head negative.  Code stroke called.  MRI was offered but patient declined.  Stating fatigue.  The following morning the patient is neurologically intact however is continuing to endorse weakness and has some tremulous this on exam.  Delayed response time. Suspect medication effect secondary to Lyrica Plan: Continue gabapentin with dialysis dosing Mental  status appears to be at baseline Avoid nonessential sedatives   Stercoral colitis Diarrhea Patient presented with diarrhea in the setting of fecal impaction resulting in stercoral colitis.  Bowel regimen with disimpaction was effective and now patient is having bowel  movements. Patient is now completed 5 days of cefepime and Flagyl Plan: No further antibiotics Patient having formed stools.  No indication for enteric precautions   Stage IV sacral decubitus ulcer Seen and evaluated by general surgery.  No indication for surgical intervention at this time Wound care consult appreciated Plan: No further antibiotics Wound care Therapy as tolerated Need skilled nursing facility Accepted to signature Needs outpatient HD chair time  Possible urinary tract infection Patient was discharged on ciprofloxacin 1 day prior to presentation Still has typical symptoms of UTI Has completed empiric course of treatment No further antibiotics  ESRD on HD Nephrology following for inpatient HD needs HD 8/23, HD 8/25 Otherwise medically ready for discharge Needs outpatient HD chair time and Dissipate inpatient HD on 8/30  Essential hypertension Poor control Continue PTA Cozaar Scheduled hydralazine PRN IV hydralazine  Hypothyroidism PTA Synthroid  Elevated troponins No significant delta Low suspicion for ACS  Type II diabetes mellitus with renal manifestations ([place]) Sliding scale Last A1c 4.9, well controlled  Anemia of chronic kidney disease Hemoglobin stable  Chronic pancreatitis Normal LFTs and lipase Creon started this admission  History of asthma Stable, unclear severity P.o. and bronchodilators  Closed fracture of sternum No indication for surgical intervention Multimodal pain control  History of herpes genitalis PTA suppressive valacyclovir    DVT prophylaxis: SQ heparin Code Status: Full Family Communication: [name] [phone] on 8/22, 8/27, at bedside on 8/29 Disposition Plan: Status is: Inpatient Remains inpatient appropriate because: Unsafe discharge plan.  Patient medically stable for discharge.  Patient has skilled nursing facility.  Needs confirmation of outpatient HD chair time.   Level of care:  Med-Surg  Consultants:  Nephrology  Procedures:  None  Antimicrobials:  Subjective: Patient seen and examined.  HD today.  Still with arthritic pain in feet.    Objective: Vitals:   12/21/21 1921 12/22/21 0508 12/22/21 0728 12/22/21 1549  BP: (!) 145/87 (!) 147/84 (!) 132/90 135/85  Pulse: 71 61 70 83  Resp: '18 18 16 17  '$ Temp: 98.6 F (37 C) 97.7 F (36.5 C) 98.1 F (36.7 C) (!) 97.5 F (36.4 C)  TempSrc:      SpO2: 100% 100% 100% 100%  Weight:  75.2 kg    Height:        Intake/Output Summary (Last 24 hours) at 12/22/2021 1705 Last data filed at 12/22/2021 1434 Gross per 24 hour  Intake 716 ml  Output 180 ml  Net 536 ml   Filed Weights   12/18/21 2100 12/20/21 0417 12/22/21 0508  Weight: 74.7 kg 75.3 kg 75.2 kg    Examination:  General exam: No acute distress Respiratory system: Lungs clear.  Normal work of breathing.  Room air Cardiovascular system: S2, RRR, no murmurs, no pedal edema Gastrointestinal system: Soft, NT/ND, normal bowel sounds Central nervous system: Alert and oriented.  No focal deficits.  No tremors Extremities: Decreased power BLE.  Muscle wasting noted bilateral lower extremities Skin: Stage IV sacral decubitus Psychiatry: Judgement and insight appear normal. Mood & affect appropriate.     Data Reviewed: I have personally reviewed following labs and imaging studies  CBC: Recent Labs  Lab 12/16/21 0854 12/21/21 0341 12/21/21 0900  WBC 3.5* 4.2 4.3  NEUTROABS 1.3*  --  1.9  HGB 7.9* 7.8* 7.7*  HCT 25.4* 25.0* 24.6*  MCV 94.4 95.8 97.2  PLT 176 203 654   Basic Metabolic Panel: Recent Labs  Lab 12/16/21 0854 12/21/21 0859 12/22/21 1142  NA 141 139 139  K 5.1 5.8* 5.3*  CL 105 100 102  CO2 '23 26 26  '$ GLUCOSE 79 79 104*  BUN 48* 81* 53*  CREATININE 4.86* 5.24* 4.13*  CALCIUM 9.3 9.6 9.3  PHOS  --  3.7 3.7   GFR: Estimated Creatinine Clearance: 17.1 mL/min (A) (by C-G formula based on SCr of 4.13 mg/dL (H)). Liver  Function Tests: Recent Labs  Lab 12/21/21 0859 12/22/21 1142  ALBUMIN 3.0* 2.9*    No results for input(s): "LIPASE", "AMYLASE" in the last 168 hours.  No results for input(s): "AMMONIA" in the last 168 hours. Coagulation Profile: No results for input(s): "INR", "PROTIME" in the last 168 hours.  Cardiac Enzymes: No results for input(s): "CKTOTAL", "CKMB", "CKMBINDEX", "TROPONINI" in the last 168 hours. BNP (last 3 results) No results for input(s): "PROBNP" in the last 8760 hours. HbA1C: No results for input(s): "HGBA1C" in the last 72 hours. CBG: Recent Labs  Lab 12/21/21 1831 12/21/21 2054 12/22/21 0731 12/22/21 1216 12/22/21 1645  GLUCAP 114* 99 77 101* 145*   Lipid Profile: No results for input(s): "CHOL", "HDL", "LDLCALC", "TRIG", "CHOLHDL", "LDLDIRECT" in the last 72 hours. Thyroid Function Tests: No results for input(s): "TSH", "T4TOTAL", "FREET4", "T3FREE", "THYROIDAB" in the last 72 hours. Anemia Panel: No results for input(s): "VITAMINB12", "FOLATE", "FERRITIN", "TIBC", "IRON", "RETICCTPCT" in the last 72 hours. Sepsis Labs: No results for input(s): "PROCALCITON", "LATICACIDVEN" in the last 168 hours.   Recent Results (from the past 240 hour(s))  Blood Culture (routine x 2)     Status: None   Collection Time: 12/12/21 10:16 PM   Specimen: BLOOD LEFT FOREARM  Result Value Ref Range Status   Specimen Description BLOOD LEFT FOREARM  Final   Special Requests   Final    BOTTLES DRAWN AEROBIC AND ANAEROBIC Blood Culture adequate volume   Culture   Final    NO GROWTH 5 DAYS Performed at [facility], [address] [place], [place] [address]    Report Status 12/17/2021 FINAL  Final  Blood Culture (routine x 2)     Status: None   Collection Time: 12/12/21 10:27 PM   Specimen: BLOOD  Result Value Ref Range Status   Specimen Description BLOOD LEFT WRIST  Final   Special Requests   Final    BOTTLES DRAWN AEROBIC AND ANAEROBIC Blood Culture adequate  volume   Culture   Final    NO GROWTH 5 DAYS Performed at [facility], [address], [place], [place] [address]    Report Status 12/17/2021 FINAL  Final         Radiology Studies: No results found.      Scheduled Meds:  (feeding supplement) PROSource Plus  30 mL Oral TID BM   vitamin C  500 mg Oral BID   aspirin EC  81 mg Oral Daily   atorvastatin  20 mg Oral Daily   Chlorhexidine Gluconate Cloth  6 each Topical Daily   diclofenac Sodium  4 g Topical QID   epoetin (EPOGEN/PROCRIT) injection  4,000 Units Intravenous Q M,W,F-HD   feeding supplement  1 Container Oral TID BM   gabapentin  100 mg Oral Q M,W,F-1800   heparin  5,000 Units Subcutaneous Q8H   insulin aspart  0-5 Units  Subcutaneous QHS   insulin aspart  0-9 Units Subcutaneous TID WC   lacosamide  100 mg Oral BID   lacosamide  50 mg Oral Q M,W,F-HD   leptospermum manuka honey  1 Application Topical Daily   levothyroxine  125 mcg Oral Q0600   lipase/protease/amylase  12,000 Units Oral TID WC   losartan  50 mg Oral Daily   multivitamin  1 tablet Oral QHS   polyethylene glycol  17 g Oral BID   senna-docusate  2 tablet Oral QHS   sevelamer carbonate  800 mg Oral TID WC   valACYclovir  500 mg Oral Daily   zinc sulfate  220 mg Oral Daily   Continuous Infusions:     LOS: 10 days     Sidney Ace, MD [facility]   If 7PM-7AM, please contact night-coverage  12/22/2021, 5:05 PM

## 2021-12-22 NOTE — Care Management Important Message (Signed)
Important Message  Patient Details  Name: Tonya Myers MRN: [id number] Date of Birth: 1965-12-24   Medicare Important Message Given:  Yes     Juliann Pulse A Shirly Bartosiewicz 12/22/2021, 2:57 PM

## 2021-12-22 NOTE — Progress Notes (Signed)
   12/18/21 1630 12/18/21 1700 12/18/21 1730  Vitals  Temp  --   --   --   Temp Source  --   --   --   BP (!) 139/96 128/88  --   MAP (mmHg) 109 100  --   BP Location Left Arm Left Arm Left Arm  BP Method Automatic Automatic Automatic  Patient Position (if appropriate) Lying Lying Lying  Pulse Rate 71 83  --   Pulse Rate Source Monitor Monitor Monitor  ECG Heart Rate 73 95  --   Resp 15 19  --   During Treatment Monitoring  Blood Flow Rate (mL/min) 400 mL/min 400 mL/min 400 mL/min  Arterial Pressure (mmHg) -130 mmHg -140 mmHg -130 mmHg  Venous Pressure (mmHg) 150 mmHg 140 mmHg 160 mmHg  TMP (mmHg) 5 mmHg 5 mmHg 5 mmHg  Ultrafiltration Rate (mL/min) 736 mL/min 736 mL/min 736 mL/min  Dialysate Flow Rate (mL/min) 300 ml/min 300 ml/min 300 ml/min  HD Safety Checks Performed Yes Yes Yes  Intra-Hemodialysis Comments Progressing as prescribed Progressing as prescribed Progressing as prescribed (ufr 1870, pt alert, on phone, vss, no c/o)    12/18/21 1740  Vitals  Temp 97.8 F (36.6 C)  Temp Source Oral  BP 135/75  MAP (mmHg) 94  BP Location Left Arm  BP Method Automatic  Patient Position (if appropriate) Lying  Pulse Rate  --   Pulse Rate Source Monitor  ECG Heart Rate  --   Resp  --   During Treatment Monitoring  Blood Flow Rate (mL/min)  --   Arterial Pressure (mmHg)  --   Venous Pressure (mmHg)  --   TMP (mmHg)  --   Ultrafiltration Rate (mL/min)  --   Dialysate Flow Rate (mL/min)  --   HD Safety Checks Performed  --   Intra-Hemodialysis Comments Tolerated well;Tx completed

## 2021-12-22 NOTE — Plan of Care (Signed)

## 2021-12-22 NOTE — Progress Notes (Signed)
   12/21/21 0858 12/21/21 0900 12/21/21 0930  Vitals  BP (!) 188/109  --  (!) 186/99  MAP (mmHg) 131  --  122  BP Location Left Arm Left Arm Left Arm  BP Method Automatic Automatic Automatic  Patient Position (if appropriate) Lying Lying Lying  Pulse Rate 61  --  64  Pulse Rate Source Monitor Monitor Monitor  ECG Heart Rate 61  --  66  Resp 17  --  14  During Treatment Monitoring  Blood Flow Rate (mL/min) 300 mL/min  --  300 mL/min  Arterial Pressure (mmHg) -70 mmHg  --  -70 mmHg  Venous Pressure (mmHg) 110 mmHg  --  120 mmHg  TMP (mmHg) 11 mmHg  --  11 mmHg  Ultrafiltration Rate (mL/min) 829 mL/min  --  829 mL/min  Dialysate Flow Rate (mL/min) 300 ml/min  --  300 ml/min  HD Safety Checks Performed Yes  --  Yes  Intra-Hemodialysis Comments Tx initiated  --  Progressing as prescribed    12/21/21 1000 12/21/21 1030  Vitals  BP (!) 183/137  --   MAP (mmHg) 153  --   BP Location Left Arm Left Arm  BP Method Automatic Automatic  Patient Position (if appropriate) Lying Lying  Pulse Rate 75  --   Pulse Rate Source Monitor Monitor  ECG Heart Rate 77  --   Resp 14  --   During Treatment Monitoring  Blood Flow Rate (mL/min) 300 mL/min  --   Arterial Pressure (mmHg) -70 mmHg  --   Venous Pressure (mmHg) 120 mmHg  --   TMP (mmHg) 11 mmHg  --   Ultrafiltration Rate (mL/min) 829 mL/min  --   Dialysate Flow Rate (mL/min) 300 ml/min  --   HD Safety Checks Performed Yes  --   Intra-Hemodialysis Comments Progressing as prescribed See progress note (Air in venous line, system changed, pt purewick not functioning, linen and p. wick change)

## 2021-12-22 NOTE — Progress Notes (Signed)
   12/18/21 1410 12/18/21 1430 12/18/21 1500  Vitals  BP (!) 159/90  --  (!) 134/91  MAP (mmHg) 111  --  104  BP Location Left Arm Left Arm Left Arm  BP Method Automatic Automatic Automatic  Patient Position (if appropriate) Lying Lying Lying  Pulse Rate 63 63 65  Pulse Rate Source Monitor Monitor Monitor  ECG Heart Rate 63  --  73  Resp 15  --  15  During Treatment Monitoring  Blood Flow Rate (mL/min) 400 mL/min 400 mL/min 400 mL/min  Arterial Pressure (mmHg) -130 mmHg -130 mmHg -140 mmHg  Venous Pressure (mmHg) 150 mmHg 140 mmHg 150 mmHg  TMP (mmHg) 4 mmHg 4 mmHg 4 mmHg  Ultrafiltration Rate (mL/min) 829 mL/min 829 mL/min 829 mL/min  Dialysate Flow Rate (mL/min) 300 ml/min 300 ml/min 300 ml/min  HD Safety Checks Performed Yes Yes Yes  Intra-Hemodialysis Comments Tx initiated Progressing as prescribed Progressing as prescribed    12/18/21 1530 12/18/21 1600  Vitals  BP (!) 141/86 134/89  MAP (mmHg) 102 101  BP Location Left Arm Left Arm  BP Method Automatic Automatic  Patient Position (if appropriate) Lying Lying  Pulse Rate 87 77  Pulse Rate Source Monitor Monitor  ECG Heart Rate 85 78  Resp 16 17  During Treatment Monitoring  Blood Flow Rate (mL/min) 400 mL/min 400 mL/min  Arterial Pressure (mmHg) -130 mmHg -140 mmHg  Venous Pressure (mmHg) 140 mmHg 150 mmHg  TMP (mmHg) 4 mmHg 4 mmHg  Ultrafiltration Rate (mL/min) 829 mL/min 829 mL/min  Dialysate Flow Rate (mL/min) 300 ml/min 300 ml/min  HD Safety Checks Performed Yes Yes  Intra-Hemodialysis Comments Progressing as prescribed Progressing as prescribed

## 2021-12-23 DIAGNOSIS — L89154 Pressure ulcer of sacral region, stage 4: Secondary | ICD-10-CM | POA: Diagnosis not present

## 2021-12-23 DIAGNOSIS — R531 Weakness: Secondary | ICD-10-CM | POA: Diagnosis not present

## 2021-12-23 DIAGNOSIS — R55 Syncope and collapse: Secondary | ICD-10-CM | POA: Diagnosis not present

## 2021-12-23 DIAGNOSIS — K5289 Other specified noninfective gastroenteritis and colitis: Secondary | ICD-10-CM | POA: Diagnosis not present

## 2021-12-23 DIAGNOSIS — G9341 Metabolic encephalopathy: Secondary | ICD-10-CM | POA: Diagnosis not present

## 2021-12-23 DIAGNOSIS — N186 End stage renal disease: Secondary | ICD-10-CM | POA: Diagnosis not present

## 2021-12-23 LAB — GLUCOSE, CAPILLARY
Glucose-Capillary: 111 mg/dL — ABNORMAL HIGH (ref 70–99)
Glucose-Capillary: 93 mg/dL (ref 70–99)
Glucose-Capillary: 99 mg/dL (ref 70–99)

## 2021-12-23 MED ORDER — LIDOCAINE 5 % EX PTCH
2.0000 | MEDICATED_PATCH | CUTANEOUS | Status: DC
  Administered 2021-12-23: 2 via TRANSDERMAL
  Filled 2021-12-23: qty 2

## 2021-12-23 MED ORDER — EPOETIN ALFA 4000 UNIT/ML IJ SOLN
INTRAMUSCULAR | Status: AC
  Administered 2021-12-23: 4000 [IU]
  Filled 2021-12-23: qty 1

## 2021-12-23 MED ORDER — HYDRALAZINE HCL 25 MG PO TABS
25.0000 mg | ORAL_TABLET | Freq: Three times a day (TID) | ORAL | Status: DC
  Administered 2021-12-23 – 2021-12-24 (×3): 25 mg via ORAL
  Filled 2021-12-23 (×3): qty 1

## 2021-12-23 NOTE — Assessment & Plan Note (Addendum)
Cleanse wound daily with saline, pat dry, apply hydrogel [place] [phone]) to the wound bed daily (only on the wound), minimize moisture to the periwound, top with dry dressing.  Change daily.  If using foam proper okay to lift and reapply gel daily, change foam every 3 days.  Low air mattress.  With re-evaluation today wound described as stage 3 by wound care nurse.

## 2021-12-23 NOTE — TOC Progression Note (Signed)
Transition of Care [facility]) - Progression Note    Patient Details  Name: Tonya Myers MRN: [id number] Date of Birth: Apr 12, 1966  Transition of Care [facility]) [name] [place], RN Phone Number: 12/23/2021, 10:34 AM  Clinical Narrative:     Reached out to Maryam at [facility] and requested to know if they can Transport the patient to [facility], awaiting a call back  Expected Discharge Plan: [place] Barriers to Discharge: No SNF bed  Expected Discharge Plan and Services Expected Discharge Plan: [place] Choice: [place] arrangements for the past 2 months: Single Family Home Expected Discharge Date: 12/22/21                                     Social Determinants of Health (SDOH) Interventions    Readmission Risk Interventions    11/24/2020    2:17 PM  Readmission Risk Prevention Plan  Transportation Screening Complete  PCP or Specialist Appt within 3-5 Days Complete  HRI or Home Care Consult Complete  Social Work Consult for [place] Planning/Counseling Complete  Palliative Care Screening Not Applicable  Medication Review Press photographer) Complete

## 2021-12-23 NOTE — Plan of Care (Signed)
Pt AAOx4, moderate neuropathy pain, didn't want any narcotics. VS are stable. HD performed this morning. Plan for discharge when HD seat is available. Bed in lowest position, call light within reach. Will continue to monitor.

## 2021-12-23 NOTE — Progress Notes (Signed)
Progress Note   Patient: Tonya Myers RXV:[id number] DOB: February 27, 1966 DOA: 12/12/2021     11 DOS: the patient was seen and examined on 12/23/2021    Assessment and Plan: Acute metabolic encephalopathy Mental status much improved.  Rapid response was called on 822 for decreased level of responsiveness and weakness. CT scan of the head was negative.  MRI was declined.  Likely secondary to Lyrica.  Awaiting to go out to rehab.  Stercoral colitis Received 5 days of antibiotics.  Stool studies negative.  Sacral decubitus ulcer, stage IV ([place]) Continue wound care with Medihoney.  Patient asking to speak with the wound care nurse again.  Put in a new consult.  Patient states the Medihoney burns a little bit.  End-stage renal disease on hemodialysis [facility]) Dialysis today.  UTI (urinary tract infection) Possible urinary tract infection.  Patient completed antibiotic course.  Essential hypertension Continue Cozaar and hydralazine  Hypothyroidism Continue Synthroid  Dyslipidemia Continue with core  Type II diabetes mellitus with renal manifestations (HCC) - Recent A1c 4.9, well controlled.  Patient taking Humalog at home -Sliding scale insulin  Anemia in ESRD (end-stage renal disease) (HCC) Hemoglobin stable 9.1 (7.9 on 12/11/2021) -Follow-up by CBC  Chronic pancreatitis ([place]) Patient was started on Creon low-dose.  Lipase has been slightly elevated even in the past.  Asthma Stable -Bronchodilators  Closed fracture of sternum - Pain control: As needed Percocet and Tylenol -Incentive spirometry  History of herpes genitalis Continue suppressive valacyclovir as prior to admission.        Subjective: Patient was asking for something for peripheral neuropathy besides gabapentin.  She stated Lyrica made her tremulous.  She was asking for the wound care nurse to see her again.  The Medihoney is burning a little bit.  Patient with numerous hospitalizations.  Physical  Exam: Vitals:   12/23/21 1100 12/23/21 1130 12/23/21 1152 12/23/21 1634  BP: (!) 128/106 (!) 132/100 (!) 169/83 (!) 184/92  Pulse: 69 70 (!) 107 70  Resp: '15 11 15   '$ Temp:   98.1 F (36.7 C) 99.3 F (37.4 C)  TempSrc:   Oral   SpO2: 100% 100% 100% 100%  Weight:   75 kg   Height:       Physical Exam HENT:     Head: Normocephalic.     Mouth/Throat:     Pharynx: No oropharyngeal exudate.  Eyes:     General: Lids are normal.     Conjunctiva/sclera: Conjunctivae normal.  Cardiovascular:     Rate and Rhythm: Normal rate and regular rhythm.     Heart sounds: Normal heart sounds, S1 normal and S2 normal.  Pulmonary:     Breath sounds: No decreased breath sounds, wheezing, rhonchi or rales.  Abdominal:     Palpations: Abdomen is soft.     Tenderness: There is no abdominal tenderness.  Musculoskeletal:     Right lower leg: No swelling.     Left lower leg: No swelling.  Skin:    General: Skin is warm.     Findings: No rash.  Neurological:     Mental Status: She is alert and oriented to person, place, and time.     Comments: Unable to sit up for me.  Unable to straight leg raise from a     Data Reviewed: Last potassium 5.3 and creatinine of 1.13  Family Communication: Fianc and family at bedside.  Disposition: Status is: Inpatient Remains inpatient appropriate because: Patient will go out to rehab.  Dialysis nurse  try to coordinate a new dialysis center near the rehab.  Also need transportation out of the rehab.  Planned Discharge Destination: Rehab    Time spent: 28 minutes  Author: Loletha Grayer, MD 12/23/2021 4:48 PM  For on call review [email or web address].

## 2021-12-23 NOTE — Assessment & Plan Note (Signed)
Mental status much improved.  Rapid response was called on 822 for decreased level of responsiveness and weakness. CT scan of the head was negative.  MRI was declined.  Likely secondary to Lyrica.  Awaiting to go out to rehab.

## 2021-12-23 NOTE — Plan of Care (Signed)

## 2021-12-23 NOTE — Progress Notes (Signed)
[facility]  ROUNDING NOTE   Subjective:   Tonya Myers is a 56 year old female with past medical history including depression, type 2 diabetes, asthma, hypertension, hypothyroidism, CLL, and end-stage renal disease on hemodialysis.   Update Patient seen and evaluated during dialysis   HEMODIALYSIS FLOWSHEET:  Blood Flow Rate (mL/min): 200 mL/min Arterial Pressure (mmHg): -150 mmHg Venous Pressure (mmHg): 210 mmHg TMP (mmHg): 3 mmHg Ultrafiltration Rate (mL/min): 739 mL/min Dialysate Flow Rate (mL/min): 300 ml/min Dialysis Fluid Bolus: Normal Saline Bolus Amount (mL): 300 mL  Resting comfortably, tolerating treatment well  Objective:  Vital signs in last 24 hours:  Temp:  [97.5 F (36.4 C)-98.4 F (36.9 C)] 98.1 F (36.7 C) (08/30 1152) Pulse Rate:  [61-107] 107 (08/30 1152) Resp:  [10-18] 15 (08/30 1152) BP: (128-173)/(76-160) 169/83 (08/30 1152) SpO2:  [100 %] 100 % (08/30 1152) Weight:  [75 kg-77 kg] 75 kg (08/30 1152)  Weight change: 1 kg Filed Weights   12/23/21 0601 12/23/21 0810 12/23/21 1152  Weight: 76.2 kg 77 kg 75 kg     Intake/Output: I/O last 3 completed shifts: In: 1336 [P.O.:1336] Out: 390 [Urine:390]   Intake/Output this shift:  Total I/O In: -  Out: 2000 [Other:2000]  Physical Exam: General: NAD  Head: Normocephalic, atraumatic. Moist oral mucosal membranes  Eyes: Anicteric  Lungs:  Clear to auscultation, normal effort  Heart: Regular rate and rhythm  Abdomen:  Soft, nontender  Extremities:  No peripheral edema.  Neurologic: Alert, moving upper extremities  Skin: No lesions  Access: Rt Permcath    Basic Metabolic Panel: Recent Labs  Lab 12/21/21 0859 12/22/21 1142  NA 139 139  K 5.8* 5.3*  CL 100 102  CO2 26 26  GLUCOSE 79 104*  BUN 81* 53*  CREATININE 5.24* 4.13*  CALCIUM 9.6 9.3  PHOS 3.7 3.7     Liver Function Tests: Recent Labs  Lab 12/21/21 0859 12/22/21 1142  ALBUMIN 3.0* 2.9*    No  results for input(s): "LIPASE", "AMYLASE" in the last 168 hours.  No results for input(s): "AMMONIA" in the last 168 hours.  CBC: Recent Labs  Lab 12/21/21 0341 12/21/21 0900  WBC 4.2 4.3  NEUTROABS  --  1.9  HGB 7.8* 7.7*  HCT 25.0* 24.6*  MCV 95.8 97.2  PLT 203 214     Cardiac Enzymes: No results for input(s): "CKTOTAL", "CKMB", "CKMBINDEX", "TROPONINI" in the last 168 hours.  BNP: Invalid input(s): "POCBNP"  CBG: Recent Labs  Lab 12/22/21 0731 12/22/21 1216 12/22/21 1645 12/22/21 2209 12/23/21 1247  GLUCAP 77 101* 145* 111* 111*     Microbiology: Results for orders placed or performed during the hospital encounter of 12/12/21  Resp Panel by RT-PCR (Flu A&B, Covid) Anterior Nasal Swab     Status: None   Collection Time: 12/12/21  4:28 AM   Specimen: Anterior Nasal Swab  Result Value Ref Range Status   SARS Coronavirus 2 by RT PCR NEGATIVE NEGATIVE Final    Comment: (NOTE) SARS-CoV-2 target nucleic acids are NOT DETECTED.  The SARS-CoV-2 RNA is generally detectable in upper respiratory specimens during the acute phase of infection. The lowest concentration of SARS-CoV-2 viral copies this assay can detect is 138 copies/mL. A negative result does not preclude SARS-Cov-2 infection and should not be used as the sole basis for treatment or other patient management decisions. A negative result may occur with  improper specimen collection/handling, submission of specimen other than nasopharyngeal swab, presence of viral mutation(s) within the areas targeted  by this assay, and inadequate number of viral copies(<138 copies/mL). A negative result must be combined with clinical observations, patient history, and epidemiological information. The expected result is Negative.  Fact Sheet for Patients:  EntrepreneurPulse.com.au  Fact Sheet for Healthcare Providers:  IncredibleEmployment.be  This test is no t yet approved or cleared  by the [place] FDA and  has been authorized for detection and/or diagnosis of SARS-CoV-2 by FDA under an Emergency Use Authorization (EUA). This EUA will remain  in effect (meaning this test can be used) for the duration of the COVID-19 declaration under Section 564(b)(1) of the Act, 21 U.S.C.section 360bbb-3(b)(1), unless the authorization is terminated  or revoked sooner.       Influenza A by PCR NEGATIVE NEGATIVE Final   Influenza B by PCR NEGATIVE NEGATIVE Final    Comment: (NOTE) The Xpert Xpress SARS-CoV-2/FLU/RSV plus assay is intended as an aid in the diagnosis of influenza from Nasopharyngeal swab specimens and should not be used as a sole basis for treatment. Nasal washings and aspirates are unacceptable for Xpert Xpress SARS-CoV-2/FLU/RSV testing.  Fact Sheet for Patients: EntrepreneurPulse.com.au  Fact Sheet for Healthcare Providers: IncredibleEmployment.be  This test is not yet approved or cleared by the [place] FDA and has been authorized for detection and/or diagnosis of SARS-CoV-2 by FDA under an Emergency Use Authorization (EUA). This EUA will remain in effect (meaning this test can be used) for the duration of the COVID-19 declaration under Section 564(b)(1) of the Act, 21 U.S.C. section 360bbb-3(b)(1), unless the authorization is terminated or revoked.  Performed at [facility], [address] [place], [place] [address]   Gastrointestinal Panel by PCR , Stool     Status: None   Collection Time: 12/12/21  8:26 AM   Specimen: Stool  Result Value Ref Range Status   Campylobacter species NOT DETECTED NOT DETECTED Final   Plesimonas shigelloides NOT DETECTED NOT DETECTED Final   Salmonella species NOT DETECTED NOT DETECTED Final   Yersinia enterocolitica NOT DETECTED NOT DETECTED Final   Vibrio species NOT DETECTED NOT DETECTED Final   Vibrio cholerae NOT DETECTED NOT DETECTED Final    Enteroaggregative E coli (EAEC) NOT DETECTED NOT DETECTED Final   Enteropathogenic E coli (EPEC) NOT DETECTED NOT DETECTED Final   Enterotoxigenic E coli (ETEC) NOT DETECTED NOT DETECTED Final   Shiga like toxin producing E coli (STEC) NOT DETECTED NOT DETECTED Final   Shigella/Enteroinvasive E coli (EIEC) NOT DETECTED NOT DETECTED Final   Cryptosporidium NOT DETECTED NOT DETECTED Final   Cyclospora cayetanensis NOT DETECTED NOT DETECTED Final   Entamoeba histolytica NOT DETECTED NOT DETECTED Final   Giardia lamblia NOT DETECTED NOT DETECTED Final   Adenovirus F40/41 NOT DETECTED NOT DETECTED Final   Astrovirus NOT DETECTED NOT DETECTED Final   Norovirus GI/GII NOT DETECTED NOT DETECTED Final   Rotavirus A NOT DETECTED NOT DETECTED Final   Sapovirus (I, II, IV, and V) NOT DETECTED NOT DETECTED Final    Comment: Performed at [facility], [address] [place], [place] [address]  C Difficile Quick Screen w PCR reflex     Status: Abnormal   Collection Time: 12/12/21  8:26 AM   Specimen: STOOL  Result Value Ref Range Status   C Diff antigen POSITIVE (A) NEGATIVE Final   C Diff toxin NEGATIVE NEGATIVE Final   C Diff interpretation Results are indeterminate. See PCR results.  Final    Comment: Performed at [facility], [place],  [place] [address]  C. Diff by PCR, Reflexed     Status: None   Collection Time: 12/12/21  8:26 AM  Result Value Ref Range Status   Toxigenic C. Difficile by PCR NEGATIVE NEGATIVE Final    Comment: Patient is colonized with non toxigenic C. difficile. May not need treatment unless significant symptoms are present. Performed at [facility], [address] [place], [place] [address]   Blood Culture (routine x 2)     Status: None   Collection Time: 12/12/21 10:16 PM   Specimen: BLOOD LEFT FOREARM  Result Value Ref Range Status   Specimen Description BLOOD LEFT FOREARM  Final   Special Requests   Final    BOTTLES  DRAWN AEROBIC AND ANAEROBIC Blood Culture adequate volume   Culture   Final    NO GROWTH 5 DAYS Performed at [facility], [address] [place], [place] [address]    Report Status 12/17/2021 FINAL  Final  Blood Culture (routine x 2)     Status: None   Collection Time: 12/12/21 10:27 PM   Specimen: BLOOD  Result Value Ref Range Status   Specimen Description BLOOD LEFT WRIST  Final   Special Requests   Final    BOTTLES DRAWN AEROBIC AND ANAEROBIC Blood Culture adequate volume   Culture   Final    NO GROWTH 5 DAYS Performed at [facility], [address], [place], [place] [address]    Report Status 12/17/2021 FINAL  Final    Coagulation Studies: No results for input(s): "LABPROT", "INR" in the last 72 hours.   Urinalysis: No results for input(s): "COLORURINE", "LABSPEC", "PHURINE", "GLUCOSEU", "HGBUR", "BILIRUBINUR", "KETONESUR", "PROTEINUR", "UROBILINOGEN", "NITRITE", "LEUKOCYTESUR" in the last 72 hours.  Invalid input(s): "APPERANCEUR"     Imaging: No results found.   Medications:       (feeding supplement) PROSource Plus  30 mL Oral TID BM   vitamin C  500 mg Oral BID   aspirin EC  81 mg Oral Daily   atorvastatin  20 mg Oral Daily   Chlorhexidine Gluconate Cloth  6 each Topical Daily   diclofenac Sodium  4 g Topical QID   epoetin (EPOGEN/PROCRIT) injection  4,000 Units Intravenous Q M,W,F-HD   feeding supplement  1 Container Oral TID BM   gabapentin  100 mg Oral Q M,W,F-1800   heparin  5,000 Units Subcutaneous Q8H   insulin aspart  0-5 Units Subcutaneous QHS   insulin aspart  0-9 Units Subcutaneous TID WC   lacosamide  100 mg Oral BID   lacosamide  50 mg Oral Q M,W,F-HD   leptospermum manuka honey  1 Application Topical Daily   levothyroxine  125 mcg Oral Q0600   lipase/protease/amylase  12,000 Units Oral TID WC   losartan  50 mg Oral Daily   multivitamin  1 tablet Oral QHS   polyethylene glycol  17 g Oral BID   senna-docusate  2  tablet Oral QHS   sevelamer carbonate  800 mg Oral TID WC   valACYclovir  500 mg Oral Daily   zinc sulfate  220 mg Oral Daily   acetaminophen, albuterol, dextromethorphan-guaiFENesin, diclofenac Sodium, fluticasone, hydrALAZINE, iohexol, ondansetron (ZOFRAN) IV, oxyCODONE-acetaminophen  Assessment/ Plan:  Tonya Myers is a 56 y.o.  female with past medical history including depression, type 2 diabetes, asthma, hypertension, hypothyroidism, CLL, and end-stage renal disease on hemodialysis.  [facility]/MWF/right PermCath/66.4kg  End-stage renal disease on hemodialysis.    Receiving dialysis today, UF goal 2  L as tolerated.  Next treatment scheduled for Friday.  Appreciate staff attempting to get [facility] lift weight.  Discharge plan to include rehab, awaiting outpatient dialysis transfer.  2. Anemia of chronic kidney disease Lab Results  Component Value Date   HGB 7.7 (L) 12/21/2021  Patient receives [place] outpatient.  Will increase to EPO 10,000 units IV with treatments.  3. Secondary Hyperparathyroidism: with outpatient labs: PTH 235, phosphorus 6.7, calcium 9.0 on 11/30/2021.   Lab Results  Component Value Date   CALCIUM 9.3 12/22/2021   PHOS 3.7 12/22/2021  We will continue to monitor bone minerals.  4. 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 Home regimen includes lispro. Most recent hemoglobin A1c is 4.9 on 12/09/2021.   Sliding scale managed by primary team.  5.  Syncopal episode during dialysis treatment.  Cardiology consulted due to elevated troponins overnight.  Echo shows EF 55 to 60%.  Home cardiac monitor remains in place from previous admission.  Patient will continue to follow-up with cardiology at discharge.  6.  Nausea/sacral wound/retained stool/C. difficile antigen positive. Management as per internal medicine team.  Completed antibiotic therapy. -Patient having regular bowel movements, denies  nausea.   LOS: [place] 8/30/20231:16 PM

## 2021-12-23 NOTE — Progress Notes (Signed)
Cross Cover Per patient request voltaren gel changed to lidocaine patch as she uses at home for her foot pain

## 2021-12-23 NOTE — Progress Notes (Signed)
PT Cancellation Note  Patient Details Name: Tonya Myers MRN: [id number] DOB: 20-Dec-1965   Cancelled Treatment:     PT attempt. Pt requested Author return later this afternoon. "Can you return at 5pm?" [facility] will try to accomodates pt as able. If unable to participate in PT this afternoon. Will return in the morning.     Willette Pa 12/23/2021, 2:22 PM

## 2021-12-23 NOTE — Progress Notes (Signed)
PT Cancellation Note  Patient Details Name: Tonya Myers MRN: [id number] DOB: 04/19/1966   Cancelled Treatment:     Pt is off floor for HD. Acute PT will continue to follow per current POC   Willette Pa 12/23/2021, 9:16 AM

## 2021-12-23 NOTE — TOC Progression Note (Signed)
Transition of Care [facility]) - Progression Note    Patient Details  Name: Tonya Myers MRN: [id number] Date of Birth: 02/29/1966  Transition of Care [facility]) [name] [place], RN Phone Number: 12/23/2021, 10:42 AM  Clinical Narrative:    [facility] be able to transport to [facility] for Dialysis    Expected Discharge Plan: [place] Barriers to Discharge: No SNF bed  Expected Discharge Plan and Services Expected Discharge Plan: [place] Choice: [place] arrangements for the past 2 months: Single Family Home Expected Discharge Date: 12/22/21                                     Social Determinants of Health (SDOH) Interventions    Readmission Risk Interventions    11/24/2020    2:17 PM  Readmission Risk Prevention Plan  Transportation Screening Complete  PCP or Specialist Appt within 3-5 Days Complete  HRI or [place] Complete  Social Work Consult for [place] Planning/Counseling Complete  Palliative Care Screening Not Applicable  Medication Review Press photographer) Complete

## 2021-12-23 NOTE — Progress Notes (Signed)
Hd tx of 3.5hrs completed. 84L total vol processed. No complications. Report given to breanne clemmons, rn. Total uf removed: 2038m Post hd wt: 75kgs Post hd v/s: 98.1 169/83(107) 66 15 100%

## 2021-12-23 NOTE — Progress Notes (Signed)
Physical Therapy Treatment Patient Details Name: Tonya Myers MRN: [id number] DOB: 21-Aug-1965 Today's Date: 12/23/2021   History of Present Illness 56 y.o. 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    PT Comments    PT/OT co-treat 2/2 to pt's limited activity tolerance and nee for +2 to safely attempt dynamic standing activity. She was A and O and agreeable to session. Does endorse having a head ache. BP slightly elevated. RN notifying MD. She was able to exit L side of bed, stand to RW and ambulate 2 x 12 ft. Does struggle to transfer from lower recliner surface heights. Recommend + 2 assistance for any OOB for pt/staff safety. She continue to demonstrate progress towards PT goals and will greatly benefit from SNF at DC to maximize independence while decreasing caregiver burden. PT will continue to follow and progress as able per current POC.    Recommendations for follow up therapy are one component of a multi-disciplinary discharge planning process, led by the attending physician.  Recommendations may be updated based on patient status, additional functional criteria and insurance authorization.  Follow Up Recommendations  Skilled nursing-short term rehab (<3 hours/day)     Assistance Recommended at Discharge Frequent or constant Supervision/Assistance  Patient can return home with the following A lot of help with walking and/or transfers;A lot of help with bathing/dressing/bathroom;Assistance with cooking/housework;Assist for transportation;Help with stairs or ramp for entrance   Equipment Recommendations   Other (comment) (defer to next level of care)       Precautions / Restrictions Precautions Precautions: Fall Precaution Comments: sacral wound Restrictions Weight Bearing Restrictions: No     Mobility  Bed Mobility Overal bed mobility: Needs Assistance Bed Mobility: Sit to Supine, Supine to Sit Rolling: Supervision   Supine to sit: Supervision, HOB elevated (did require assistance to get hips to EOB) Sit to supine: Min assist, +2 for safety/equipment   General bed mobility comments: Pt require min A to progress BLES into bed from EOB short sit. supervision to achieve EOB sitting but unable to slide hips to EOB without min assist    Transfers Overall transfer level: Needs assistance Equipment used: Rolling walker (2 wheels) Transfers: Sit to/from Stand Sit to Stand: +2 physical assistance, +2 safety/equipment, Min assist, From elevated surface, Mod assist           General transfer comment: pt stood from elevated bed height with min assist +2. mod assist +2 to stand from recliner height surface. Vcs for handplacement, hips out to edge of chair, and fwd wt shift throughout. +2 for safety    Ambulation/Gait Ambulation/Gait assistance: Min assist Gait Distance (Feet): 12 Feet Assistive device: Rolling walker (2 wheels) Gait Pattern/deviations: Step-to pattern, Ataxic Gait velocity: decreased     General Gait Details: Pt ambulate 2 x 12 ft with RW. slow step to pattern with slight knee hyperextension + foot drop. may benefit from AFO if stability does not improve. Overall tolerated well.     Balance Overall balance assessment: Needs assistance Sitting-balance support: Feet supported, Bilateral upper extremity supported Sitting balance-Leahy Scale: Good     Standing balance support: Reliant on assistive device for balance, During functional activity, Bilateral upper extremity  supported Standing balance-Leahy Scale: Fair Standing balance comment: static standing good  (CGA/supervision) dynamic fair (min A)         Cognition Arousal/Alertness: Awake/alert Behavior During Therapy: WFL for tasks assessed/performed Overall Cognitive Status: Within Functional Limits for tasks assessed      General Comments: she like to dictate session progression however is cooperative. straight forward about what she feels comfortable with           General Comments General comments (skin integrity, edema, etc.): continued to encourage increased intake and activity throughout the day.      Pertinent Vitals/Pain Pain Assessment Pain Location: RUE stiffness Pain Descriptors / Indicators: Aching, Discomfort Pain Intervention(s): Limited activity within patient's tolerance, Monitored during session, Repositioned     PT Goals (current goals can now be found in the care plan section) Acute Rehab PT Goals Patient Stated Goal: rehab then home Progress towards PT goals: Progressing toward goals    Frequency    Min 2X/week      PT Plan Current plan remains appropriate    Co-evaluation PT/OT/SLP Co-Evaluation/Treatment: Yes Reason for Co-Treatment: For patient/therapist safety;To address functional/ADL transfers PT goals addressed during session: Mobility/safety with mobility;Balance;Proper use of DME OT goals addressed during session: ADL's and self-care;Proper use of Adaptive equipment and DME      AM-PAC PT "6 Clicks" Mobility   Outcome Measure  Help needed turning from your back to your side while in a flat bed without using bedrails?: A Little Help needed moving from lying on your back to sitting on the side of a flat bed without using bedrails?: A Little Help needed moving to and from a bed to a chair (including a wheelchair)?: A Lot Help needed standing up from a chair using your arms (e.g., wheelchair or bedside chair)?: A Lot Help needed to walk in hospital room?: A Lot Help needed climbing 3-5 steps with a railing? : Total 6 Click Score: 13     End of Session Equipment Utilized During Treatment: Gait belt Activity Tolerance: Patient tolerated treatment well Patient left: in bed;with call bell/phone within reach;with bed alarm set;with family/visitor present;with nursing/sitter in room Nurse Communication: Mobility status PT Visit Diagnosis: Muscle weakness (generalized) (M62.81);Difficulty in walking, not elsewhere classified (R26.2)     Time: 0630-1601 PT Time Calculation (min) (ACUTE ONLY): 28 min  Charges:  $Gait Training: 8-22 mins                     Julaine Fusi PTA 12/23/21, 5:33 PM

## 2021-12-23 NOTE — Progress Notes (Signed)
0755 Pt being transported to dialysis at this time

## 2021-12-23 NOTE — Progress Notes (Signed)
OT Cancellation Note  Patient Details Name: Tonya Myers MRN: [id number] DOB: 1966-03-08   Cancelled Treatment:    Reason Eval/Treat Not Completed: Patient at procedure or test/ unavailable. Patient currently off the floor at dialysis. OT to re-attempt as able.  Lanelle Bal  [facility] 12/23/2021, 11:04 AM

## 2021-12-23 NOTE — Progress Notes (Signed)
Occupational Therapy Treatment Patient Details Name: Tonya Myers MRN: [id number] DOB: 1965-06-24 Today's Date: 12/23/2021   History of present illness 56 y.o. 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   OT comments  Upon entering session, pt resting in bed and agreeable to OT/PT co-treatment. Pt demonstrated improvements in activity tolerance and balance this date as pt was able to complete functional mobility within the room. Pt required one seated rest break. Pt required Max A to complete LB dressing, Min A for supine<>sit, supervision for rolling, Min A x2 for STS from elevated surface, and Mod A x2 for STS from lower surface. Pt was motivated to participate in therapy. Pt is making progress toward goal completion. D/C recommendation remains appropriate. OT will continue to follow acutely.   Recommendations for follow up therapy are one component of a multi-disciplinary discharge planning process, led by the attending physician.  Recommendations may be updated based on patient status, additional functional criteria and insurance authorization.    Follow Up Recommendations  Skilled nursing-short term rehab (<3 hours/day)    Assistance Recommended at Discharge Frequent or constant Supervision/Assistance  Patient can return home with the following  A lot of help with walking and/or transfers;A lot of help with bathing/dressing/bathroom;Assistance with cooking/housework;Assist for transportation   Equipment Recommendations  Other (comment) (defer to next venue of care)    Recommendations for Other Services       Precautions / Restrictions Precautions Precautions: Fall Precaution Comments: sacral wound Restrictions Weight Bearing Restrictions: No       Mobility Bed Mobility Overal bed mobility: Needs Assistance Bed Mobility: Supine to Sit, Sit to Supine Rolling: Supervision (rolling L/R to complete LB dressing)   Supine to sit: Supervision, HOB elevated Sit to supine: Min assist, +2 for physical assistance (to manage BLEs)   General bed mobility comments: Min A to scoot hips forward at EOB    Transfers Overall transfer level: Needs assistance Equipment used: Rolling walker (2 wheels) Transfers: Sit to/from Stand Sit to Stand: Mod assist, Min assist, +2 physical assistance           General transfer comment: STS from elevated bed surface with Min A x2 + RW. STS from lower t/f surface (recliner) with Mod A x2 + RW.     Balance Overall balance assessment: Needs assistance Sitting-balance support: Feet supported, Bilateral upper extremity supported Sitting balance-Leahy Scale: Good Sitting balance - Comments: supervision   Standing balance support: Reliant on assistive device for balance, During functional activity, Bilateral upper extremity supported Standing balance-Leahy Scale: Fair Standing balance comment: Min A x1 for dynamic standing balance with RW                           ADL either performed or assessed with clinical judgement   ADL Overall ADL's : Needs assistance/impaired                     Lower Body Dressing: Maximal assistance;Bed level;Cueing for sequencing Lower Body Dressing Details (indicate cue type and reason): Pt required Max A to don mesh underwear at bed level, pt able to assist with log rolling to  L/R side, assist for pulling up underwear over B hips. Max A to don/doff tennis shoes. Toilet Transfer: Moderate assistance;+2 for physical assistance;Rolling walker (2 wheels) Toilet Transfer Details (indicate cue type and reason):  stimulated, Mod A x2 for STS from lower transfer surface         Functional mobility during ADLs: Minimal assistance;Rolling walker (2 wheels) (Min A x1 + RW for room level functional mobility and chair follow)      Extremity/Trunk Assessment Upper Extremity Assessment Upper Extremity Assessment: Generalized weakness   Lower Extremity Assessment Lower Extremity Assessment: Generalized weakness        Vision Patient Visual Report: No change from baseline     Perception     Praxis      Cognition Arousal/Alertness: Awake/alert Behavior During Therapy: WFL for tasks assessed/performed Overall Cognitive Status: Within Functional Limits for tasks assessed                                          Exercises      Shoulder Instructions       General Comments Supine: BP 187/102 (126), HR 72; then 181/93 (116). RN aware and present during session, gave ok to continue with mobility.    Pertinent Vitals/ Pain       Pain Assessment Pain Assessment: Faces Faces Pain Scale: Hurts a little bit Pain Location: headache Pain Descriptors / Indicators: Aching, Discomfort Pain Intervention(s): Limited activity within patient's tolerance, Monitored during session, Repositioned  Home Living                                          Prior Functioning/Environment              Frequency  Min 2X/week        Progress Toward Goals  OT Goals(current goals can now be found in the care plan section)  Progress towards OT goals: Progressing toward goals  Acute Rehab OT Goals Patient Stated Goal: to return to PLOF and go to rehab OT Goal Formulation: With patient Time For Goal Achievement: 12/29/21 Potential to Achieve Goals: [place] Discharge plan remains appropriate;Frequency remains appropriate    Co-evaluation    PT/OT/SLP Co-Evaluation/Treatment: Yes Reason for Co-Treatment: For patient/therapist safety;To address functional/ADL  transfers PT goals addressed during session: Mobility/safety with mobility;Balance;Proper use of DME OT goals addressed during session: ADL's and self-care;Proper use of Adaptive equipment and DME      AM-PAC OT "6 Clicks" Daily Activity     Outcome Measure   Help from another person eating meals?: A Little Help from another person taking care of personal grooming?: A Little Help from another person toileting, which includes using toliet, bedpan, or urinal?: A Lot Help from another person bathing (including washing, rinsing, drying)?: A Lot Help from another person to put on and taking off regular upper body clothing?: A Little Help from another person to put on and taking off regular lower body clothing?: A Lot 6 Click Score: 15    End of Session Equipment Utilized During Treatment: Rolling walker (2 wheels);Gait belt  OT Visit Diagnosis: Unsteadiness on feet (R26.81);Muscle weakness (generalized) (M62.81)   Activity Tolerance Patient tolerated treatment well   Patient Left in bed;with call bell/phone within reach;with bed alarm set   Nurse Communication Mobility  status        Time: 0044-7158 OT Time Calculation (min): 28 min  Charges: OT General Charges $OT Visit: 1 Visit OT Treatments $Self Care/Home Management : 8-22 mins  [facility] MS, OTR/L ascom [phone]  12/23/21, 5:38 PM

## 2021-12-24 DIAGNOSIS — G9341 Metabolic encephalopathy: Secondary | ICD-10-CM | POA: Diagnosis not present

## 2021-12-24 DIAGNOSIS — L89154 Pressure ulcer of sacral region, stage 4: Secondary | ICD-10-CM | POA: Diagnosis not present

## 2021-12-24 DIAGNOSIS — N186 End stage renal disease: Secondary | ICD-10-CM | POA: Diagnosis not present

## 2021-12-24 DIAGNOSIS — R531 Weakness: Secondary | ICD-10-CM | POA: Diagnosis not present

## 2021-12-24 DIAGNOSIS — R55 Syncope and collapse: Secondary | ICD-10-CM | POA: Diagnosis not present

## 2021-12-24 DIAGNOSIS — K5289 Other specified noninfective gastroenteritis and colitis: Secondary | ICD-10-CM | POA: Diagnosis not present

## 2021-12-24 LAB — GLUCOSE, CAPILLARY
Glucose-Capillary: 80 mg/dL (ref 70–99)
Glucose-Capillary: 115 mg/dL — ABNORMAL HIGH (ref 70–99)

## 2021-12-24 MED ORDER — LIDOCAINE 5 % EX PTCH
2.0000 | MEDICATED_PATCH | CUTANEOUS | 0 refills | Status: DC
Start: 2021-12-24 — End: 2023-03-28

## 2021-12-24 MED ORDER — HYDRALAZINE HCL 25 MG PO TABS: 25.0000 mg | ORAL_TABLET | Freq: Three times a day (TID) | ORAL | 0 refills | Status: DC

## 2021-12-24 MED ORDER — OXYCODONE-ACETAMINOPHEN 5-325 MG PO TABS: 1.0000 | ORAL_TABLET | Freq: Four times a day (QID) | ORAL | 0 refills | Status: DC | PRN

## 2021-12-24 NOTE — Progress Notes (Signed)
[facility]  ROUNDING NOTE   Subjective:   Tonya Myers is a 56 year old female with past medical history including depression, type 2 diabetes, asthma, hypertension, hypothyroidism, CLL, and end-stage renal disease on hemodialysis.   Update Patient seen sitting up in bed, currently eating breakfast Husband at bedside Patient aware that we are still seeking outpatient dialysis chair. Patient states she may consider taking rehab bed offered in [facility] to stay at her home dialysis clinic.  Objective:  Vital signs in last 24 hours:  Temp:  [98.2 F (36.8 C)-99.3 F (37.4 C)] 98.5 F (36.9 C) (08/31 0750) Pulse Rate:  [66-81] 70 (08/31 0750) Resp:  [14-18] 18 (08/31 0750) BP: (130-184)/(69-92) 132/86 (08/31 0750) SpO2:  [100 %] 100 % (08/31 0750)  Weight change: 0.8 kg Filed Weights   12/23/21 0601 12/23/21 0810 12/23/21 1152  Weight: 76.2 kg 77 kg 75 kg     Intake/Output: I/O last 3 completed shifts: In: 800 [P.O.:800] Out: 2385 [Urine:385; Other:2000]   Intake/Output this shift:  Total I/O In: 120 [P.O.:120] Out: -   Physical Exam: General: NAD  Head: Normocephalic, atraumatic. Moist oral mucosal membranes  Eyes: Anicteric  Lungs:  Clear to auscultation, normal effort  Heart: Regular rate and rhythm  Abdomen:  Soft, nontender  Extremities:  No peripheral edema.  Neurologic: Alert, moving upper extremities  Skin: No lesions  Access: Rt Permcath    Basic Metabolic Panel: Recent Labs  Lab 12/21/21 0859 12/22/21 1142  NA 139 139  K 5.8* 5.3*  CL 100 102  CO2 26 26  GLUCOSE 79 104*  BUN 81* 53*  CREATININE 5.24* 4.13*  CALCIUM 9.6 9.3  PHOS 3.7 3.7     Liver Function Tests: Recent Labs  Lab 12/21/21 0859 12/22/21 1142  ALBUMIN 3.0* 2.9*    No results for input(s): "LIPASE", "AMYLASE" in the last 168 hours.  No results for input(s): "AMMONIA" in the last 168 hours.  CBC: Recent Labs  Lab 12/21/21 0341  12/21/21 0900  WBC 4.2 4.3  NEUTROABS  --  1.9  HGB 7.8* 7.7*  HCT 25.0* 24.6*  MCV 95.8 97.2  PLT 203 214     Cardiac Enzymes: No results for input(s): "CKTOTAL", "CKMB", "CKMBINDEX", "TROPONINI" in the last 168 hours.  BNP: Invalid input(s): "POCBNP"  CBG: Recent Labs  Lab 12/23/21 1247 12/23/21 1746 12/23/21 2113 12/24/21 0752 12/24/21 1156  GLUCAP 111* 93 99 80 115*     Microbiology: Results for orders placed or performed during the hospital encounter of 12/12/21  Resp Panel by RT-PCR (Flu A&B, Covid) Anterior Nasal Swab     Status: None   Collection Time: 12/12/21  4:28 AM   Specimen: Anterior Nasal Swab  Result Value Ref Range Status   SARS Coronavirus 2 by RT PCR NEGATIVE NEGATIVE Final    Comment: (NOTE) SARS-CoV-2 target nucleic acids are NOT DETECTED.  The SARS-CoV-2 RNA is generally detectable in upper respiratory specimens during the acute phase of infection. The lowest concentration of SARS-CoV-2 viral copies this assay can detect is 138 copies/mL. A negative result does not preclude SARS-Cov-2 infection and should not be used as the sole basis for treatment or other patient management decisions. A negative result may occur with  improper specimen collection/handling, submission of specimen other than nasopharyngeal swab, presence of viral mutation(s) within the areas targeted by this assay, and inadequate number of viral copies(<138 copies/mL). A negative result must be combined with clinical observations, patient history, and epidemiological information. The  expected result is Negative.  Fact Sheet for Patients:  EntrepreneurPulse.com.au  Fact Sheet for Healthcare Providers:  IncredibleEmployment.be  This test is no t yet approved or cleared by the [place] FDA and  has been authorized for detection and/or diagnosis of SARS-CoV-2 by FDA under an Emergency Use Authorization (EUA). This EUA will remain   in effect (meaning this test can be used) for the duration of the COVID-19 declaration under Section 564(b)(1) of the Act, 21 U.S.C.section 360bbb-3(b)(1), unless the authorization is terminated  or revoked sooner.       Influenza A by PCR NEGATIVE NEGATIVE Final   Influenza B by PCR NEGATIVE NEGATIVE Final    Comment: (NOTE) The Xpert Xpress SARS-CoV-2/FLU/RSV plus assay is intended as an aid in the diagnosis of influenza from Nasopharyngeal swab specimens and should not be used as a sole basis for treatment. Nasal washings and aspirates are unacceptable for Xpert Xpress SARS-CoV-2/FLU/RSV testing.  Fact Sheet for Patients: EntrepreneurPulse.com.au  Fact Sheet for Healthcare Providers: IncredibleEmployment.be  This test is not yet approved or cleared by the [place] FDA and has been authorized for detection and/or diagnosis of SARS-CoV-2 by FDA under an Emergency Use Authorization (EUA). This EUA will remain in effect (meaning this test can be used) for the duration of the COVID-19 declaration under Section 564(b)(1) of the Act, 21 U.S.C. section 360bbb-3(b)(1), unless the authorization is terminated or revoked.  Performed at [facility], [address] [place], [place] [address]   Gastrointestinal Panel by PCR , Stool     Status: None   Collection Time: 12/12/21  8:26 AM   Specimen: Stool  Result Value Ref Range Status   Campylobacter species NOT DETECTED NOT DETECTED Final   Plesimonas shigelloides NOT DETECTED NOT DETECTED Final   Salmonella species NOT DETECTED NOT DETECTED Final   Yersinia enterocolitica NOT DETECTED NOT DETECTED Final   Vibrio species NOT DETECTED NOT DETECTED Final   Vibrio cholerae NOT DETECTED NOT DETECTED Final   Enteroaggregative E coli (EAEC) NOT DETECTED NOT DETECTED Final   Enteropathogenic E coli (EPEC) NOT DETECTED NOT DETECTED Final   Enterotoxigenic E coli (ETEC) NOT DETECTED NOT  DETECTED Final   Shiga like toxin producing E coli (STEC) NOT DETECTED NOT DETECTED Final   Shigella/Enteroinvasive E coli (EIEC) NOT DETECTED NOT DETECTED Final   Cryptosporidium NOT DETECTED NOT DETECTED Final   Cyclospora cayetanensis NOT DETECTED NOT DETECTED Final   Entamoeba histolytica NOT DETECTED NOT DETECTED Final   Giardia lamblia NOT DETECTED NOT DETECTED Final   Adenovirus F40/41 NOT DETECTED NOT DETECTED Final   Astrovirus NOT DETECTED NOT DETECTED Final   Norovirus GI/GII NOT DETECTED NOT DETECTED Final   Rotavirus A NOT DETECTED NOT DETECTED Final   Sapovirus (I, II, IV, and V) NOT DETECTED NOT DETECTED Final    Comment: Performed at [facility], [address] [place], [place] [address]  C Difficile Quick Screen w PCR reflex     Status: Abnormal   Collection Time: 12/12/21  8:26 AM   Specimen: STOOL  Result Value Ref Range Status   C Diff antigen POSITIVE (A) NEGATIVE Final   C Diff toxin NEGATIVE NEGATIVE Final   C Diff interpretation Results are indeterminate. See PCR results.  Final    Comment: Performed at [facility], [address] [place], [place] [address]  C. Diff by PCR, Reflexed     Status: None   Collection Time: 12/12/21  8:26 AM  Result Value  Ref Range Status   Toxigenic C. Difficile by PCR NEGATIVE NEGATIVE Final    Comment: Patient is colonized with non toxigenic C. difficile. May not need treatment unless significant symptoms are present. Performed at [facility], [address] [place], [place] [address]   Blood Culture (routine x 2)     Status: None   Collection Time: 12/12/21 10:16 PM   Specimen: BLOOD LEFT FOREARM  Result Value Ref Range Status   Specimen Description BLOOD LEFT FOREARM  Final   Special Requests   Final    BOTTLES DRAWN AEROBIC AND ANAEROBIC Blood Culture adequate volume   Culture   Final    NO GROWTH 5 DAYS Performed at [facility], [address] [place], [place] [address]     Report Status 12/17/2021 FINAL  Final  Blood Culture (routine x 2)     Status: None   Collection Time: 12/12/21 10:27 PM   Specimen: BLOOD  Result Value Ref Range Status   Specimen Description BLOOD LEFT WRIST  Final   Special Requests   Final    BOTTLES DRAWN AEROBIC AND ANAEROBIC Blood Culture adequate volume   Culture   Final    NO GROWTH 5 DAYS Performed at [facility], [address], [place], [place] [address]    Report Status 12/17/2021 FINAL  Final    Coagulation Studies: No results for input(s): "LABPROT", "INR" in the last 72 hours.   Urinalysis: No results for input(s): "COLORURINE", "LABSPEC", "PHURINE", "GLUCOSEU", "HGBUR", "BILIRUBINUR", "KETONESUR", "PROTEINUR", "UROBILINOGEN", "NITRITE", "LEUKOCYTESUR" in the last 72 hours.  Invalid input(s): "APPERANCEUR"     Imaging: No results found.   Medications:       (feeding supplement) PROSource Plus  30 mL Oral TID BM   vitamin C  500 mg Oral BID   aspirin EC  81 mg Oral Daily   atorvastatin  20 mg Oral Daily   Chlorhexidine Gluconate Cloth  6 each Topical Daily   epoetin (EPOGEN/PROCRIT) injection  4,000 Units Intravenous Q M,W,F-HD   feeding supplement  1 Container Oral TID BM   gabapentin  100 mg Oral Q M,W,F-1800   heparin  5,000 Units Subcutaneous Q8H   hydrALAZINE  25 mg Oral Q8H   insulin aspart  0-5 Units Subcutaneous QHS   insulin aspart  0-9 Units Subcutaneous TID WC   lacosamide  100 mg Oral BID   lacosamide  50 mg Oral Q M,W,F-HD   levothyroxine  125 mcg Oral Q0600   lidocaine  2 patch Transdermal Q24H   lipase/protease/amylase  12,000 Units Oral TID WC   losartan  50 mg Oral Daily   multivitamin  1 tablet Oral QHS   polyethylene glycol  17 g Oral BID   senna-docusate  2 tablet Oral QHS   sevelamer carbonate  800 mg Oral TID WC   valACYclovir  500 mg Oral Daily   zinc sulfate  220 mg Oral Daily   acetaminophen, albuterol, dextromethorphan-guaiFENesin, fluticasone,  hydrALAZINE, iohexol, ondansetron (ZOFRAN) IV, oxyCODONE-acetaminophen  Assessment/ Plan:  Ms. Neidra Girvan is a 56 y.o.  female with past medical history including depression, type 2 diabetes, asthma, hypertension, hypothyroidism, CLL, and end-stage renal disease on hemodialysis.  [facility]/MWF/right PermCath/66.4kg  End-stage renal disease on hemodialysis.    Dialysis received yesterday, UF 2 L achieved.  Next treatment scheduled for Friday.  Discharge plan pending rehab and outpatient clinic placement.  2. Anemia of chronic kidney disease Lab Results  Component Value Date  HGB 7.7 (L) 12/21/2021  Patient receives [facility] outpatient.  Continue EPO 10,000 units IV with treatments.  3. Secondary Hyperparathyroidism: with outpatient labs: PTH 235, phosphorus 6.7, calcium 9.0 on 11/30/2021.   Lab Results  Component Value Date   CALCIUM 9.3 12/22/2021   PHOS 3.7 12/22/2021  Bone minerals remain within acceptable target.    4. 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 Home regimen includes lispro. Most recent hemoglobin A1c is 4.9 on 12/09/2021.   Sliding scale managed by primary team.  5.  Syncopal episode during dialysis treatment.  Cardiology consulted due to elevated troponins overnight.  Echo shows EF 55 to 60%.  Home cardiac monitor remains in place from previous admission.  Patient will continue to follow-up with cardiology.  6.  Nausea/sacral wound/retained stool/C. difficile antigen positive. Management as per internal medicine team.  Completed antibiotic therapy. -Patient having regular bowel movements, denies nausea.   LOS: 12 [place] 8/31/202312:40 PM

## 2021-12-24 NOTE — Plan of Care (Signed)

## 2021-12-24 NOTE — Progress Notes (Signed)
Occupational Therapy Treatment Patient Details Name: Tonya Myers MRN: [id number] DOB: Dec 29, 1965 Today's Date: 12/24/2021   History of present illness 56 y.o. 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   OT comments  Ms. Tineo made good effort today, requesting that she be able to try to do everything possible herself, with therapist only assisting as a last resort. She is able to transfer supine<>site w/ SUPV-CGA, then to change UB clothing w/ MinA. She did require MaxA for LB dressing. Engaged in UE therex (shoulder, elbow, wrist, digits) to improve strength, flexibility, and comfort, as pt reported stiffness and 4/10 pain particularly in RUE. Provided educ/strategies re: managing medications, as pt states she would like to be able to perform this task [facility] when she returns home. Pt continues to state she wants to return to SNF to work on returning to [facility].   Recommendations for follow up therapy are one component of a multi-disciplinary discharge planning process, led by the attending physician.  Recommendations may be updated based on patient status, additional functional criteria and insurance authorization.    Follow Up Recommendations  Skilled nursing-short term rehab (<3 hours/day)    Assistance Recommended at Discharge Frequent or constant Supervision/Assistance  Patient can return home with the following  A lot of help with walking and/or transfers;A lot of help with bathing/dressing/bathroom;Assistance with cooking/housework;Assist for transportation   Equipment Recommendations  None recommended by  OT    Recommendations for Other Services      Precautions / Restrictions Precautions Precautions: Fall Precaution Comments: sacral wound Restrictions Weight Bearing Restrictions: No       Mobility Bed Mobility Overal bed mobility: Needs Assistance Bed Mobility: Rolling, Supine to Sit, Sit to Supine Rolling: Supervision   Supine to sit: Supervision, HOB elevated Sit to supine: Min guard   General bed mobility comments: Mod A to reposition towards [facility]    Transfers Overall transfer level: Needs assistance Equipment used: Rolling walker (2 wheels) Transfers: Sit to/from Stand Sit to Stand: Mod assist                 Balance Overall balance assessment: Needs assistance Sitting-balance support: Feet supported, Bilateral upper extremity supported Sitting balance-Leahy Scale: Good     Standing balance support: Reliant on assistive device for balance, Bilateral upper extremity supported Standing balance-Leahy Scale: Fair                             ADL either performed or assessed with clinical judgement   ADL Overall ADL's : Needs assistance/impaired     Grooming: Wash/dry face;Sitting;Minimal assistance;Oral care;Wash/dry hands           Upper Body Dressing : Minimal assistance;Sitting   Lower Body Dressing: Maximal assistance;Bed level                      Extremity/Trunk Assessment Upper Extremity Assessment Upper Extremity Assessment: Generalized weakness;RUE deficits/detail RUE Deficits / Details: 3/5 gross strength and significantly decreased grip strength   Lower Extremity Assessment Lower Extremity Assessment: Generalized weakness        Vision       Perception  Praxis      Cognition Arousal/Alertness: Awake/alert Behavior During Therapy: WFL for tasks assessed/performed Overall Cognitive Status: Within Functional Limits for tasks assessed                                          Exercises  Other Exercises Other Exercises: UE/finger therex in sitting and supine    Shoulder Instructions       General Comments      Pertinent Vitals/ Pain       Pain Assessment Pain Assessment: 0-10 Pain Score: 4  Pain Location: right side of body Pain Descriptors / Indicators: Aching, Discomfort Pain Intervention(s): Repositioned, Utilized relaxation techniques, Premedicated before session  Home Living                                          Prior Functioning/Environment              Frequency  Min 2X/week        Progress Toward Goals  OT Goals(current goals can now be found in the care plan section)  Progress towards OT goals: Progressing toward goals  Acute Rehab OT Goals Patient Stated Goal: to manage own medications OT Goal Formulation: With patient Time For Goal Achievement: 12/29/21 Potential to Achieve Goals: Good  Plan Discharge plan remains appropriate;Frequency remains appropriate    Co-evaluation                 AM-PAC OT "6 Clicks" Daily Activity     Outcome Measure   Help from another person eating meals?: A Little Help from another person taking care of personal grooming?: A Little Help from another person toileting, which includes using toliet, bedpan, or urinal?: A Lot Help from another person bathing (including washing, rinsing, drying)?: A Lot Help from another person to put on and taking off regular upper body clothing?: A Little Help from another person to put on and taking off regular lower body clothing?: A Lot 6 Click Score: 15    End of Session Equipment Utilized During Treatment: Rolling walker (2 wheels)  OT Visit Diagnosis: Unsteadiness on feet (R26.81);Muscle weakness (generalized) (M62.81);Other abnormalities of gait and mobility (R26.89)   Activity Tolerance Patient tolerated treatment well   Patient Left in bed;with call bell/phone within reach;with nursing/sitter in room   Nurse Communication  Mobility status        Time: 1497-0263 OT Time Calculation (min): 30 min  Charges: OT General Charges $OT Visit: 1 Visit OT Treatments $Self Care/Home Management : 23-37 mins Josiah Lobo, PhD, MS, OTR/L 12/24/21, 2:10 PM

## 2021-12-24 NOTE — Plan of Care (Signed)
Problem: Education: Goal: Ability to describe self-care measures that may prevent or decrease complications (Diabetes Survival Skills Education) will improve 12/24/2021 1145 by Ardelia Mems, RN Outcome: Adequate for Discharge 12/24/2021 [phone] by Ardelia Mems, RN Outcome: Progressing Goal: Individualized Educational Video(s) 12/24/2021 1145 by Ardelia Mems, RN Outcome: Adequate for Discharge 12/24/2021 0835 by Ardelia Mems, RN Outcome: Progressing   Problem: Coping: Goal: Ability to adjust to condition or change in health will improve 12/24/2021 1145 by Ardelia Mems, RN Outcome: Adequate for Discharge 12/24/2021 0835 by Ardelia Mems, RN Outcome: Progressing   Problem: Fluid Volume: Goal: Ability to maintain a balanced intake and output will improve 12/24/2021 1145 by Ardelia Mems, RN Outcome: Adequate for Discharge 12/24/2021 0835 by Ardelia Mems, RN Outcome: Progressing   Problem: Health Behavior/Discharge Planning: Goal: Ability to identify and utilize available resources and services will improve 12/24/2021 1145 by Ardelia Mems, RN Outcome: Adequate for Discharge 12/24/2021 0835 by Ardelia Mems, RN Outcome: Progressing Goal: Ability to manage health-related needs will improve 12/24/2021 1145 by Ardelia Mems, RN Outcome: Adequate for Discharge 12/24/2021 0835 by Ardelia Mems, RN Outcome: Progressing   Problem: Metabolic: Goal: Ability to maintain appropriate glucose levels will improve 12/24/2021 1145 by Ardelia Mems, RN Outcome: Adequate for Discharge 12/24/2021 0835 by Ardelia Mems, RN Outcome: Progressing   Problem: Nutritional: Goal: Maintenance of adequate nutrition will improve 12/24/2021 1145 by Ardelia Mems, RN Outcome: Adequate for Discharge 12/24/2021 [phone] by Ardelia Mems, RN Outcome: Progressing Goal: Progress toward achieving an optimal weight will  improve 12/24/2021 1145 by Ardelia Mems, RN Outcome: Adequate for Discharge 12/24/2021 0835 by Ardelia Mems, RN Outcome: Progressing   Problem: Skin Integrity: Goal: Risk for impaired skin integrity will decrease 12/24/2021 1145 by Ardelia Mems, RN Outcome: Adequate for Discharge 12/24/2021 0835 by Ardelia Mems, RN Outcome: Progressing   Problem: Tissue Perfusion: Goal: Adequacy of tissue perfusion will improve 12/24/2021 1145 by Ardelia Mems, RN Outcome: Adequate for Discharge 12/24/2021 0835 by Ardelia Mems, RN Outcome: Progressing   Problem: Education: Goal: Knowledge of General Education information will improve Description: Including pain rating scale, medication(s)/side effects and non-pharmacologic comfort measures 12/24/2021 1145 by Ardelia Mems, RN Outcome: Adequate for Discharge 12/24/2021 0835 by Ardelia Mems, RN Outcome: Progressing   Problem: Health Behavior/Discharge Planning: Goal: Ability to manage health-related needs will improve 12/24/2021 1145 by Ardelia Mems, RN Outcome: Adequate for Discharge 12/24/2021 0835 by Ardelia Mems, RN Outcome: Progressing   Problem: Clinical Measurements: Goal: Ability to maintain clinical measurements within normal limits will improve 12/24/2021 1145 by Ardelia Mems, RN Outcome: Adequate for Discharge 12/24/2021 0835 by Ardelia Mems, RN Outcome: Progressing Goal: Will remain free from infection 12/24/2021 1145 by Ardelia Mems, RN Outcome: Adequate for Discharge 12/24/2021 [phone] by Ardelia Mems, RN Outcome: Progressing Goal: Diagnostic test results will improve 12/24/2021 1145 by Ardelia Mems, RN Outcome: Adequate for Discharge 12/24/2021 0835 by Ardelia Mems, RN Outcome: Progressing Goal: Respiratory complications will improve 12/24/2021 1145 by Ardelia Mems, RN Outcome: Adequate for Discharge 12/24/2021 0835 by  Ardelia Mems, RN Outcome: Progressing Goal: Cardiovascular complication will be avoided 12/24/2021 1145 by Ardelia Mems, RN Outcome: Adequate for Discharge 12/24/2021 0835 by Ardelia Mems, RN Outcome: Progressing   Problem: Activity: Goal: Risk for activity intolerance will decrease 12/24/2021 1145 by Ardelia Mems, RN Outcome: Adequate for Discharge 12/24/2021 [phone] by Ardelia Mems, RN Outcome: Progressing   Problem: Nutrition: Goal: Adequate nutrition will be maintained 12/24/2021 1145 by Ardelia Mems, RN Outcome: Adequate for Discharge 12/24/2021 0835 by Ardelia Mems, RN Outcome: Progressing  Problem: Coping: Goal: Level of anxiety will decrease 12/24/2021 1145 by Ardelia Mems, RN Outcome: Adequate for Discharge 12/24/2021 [phone] by Ardelia Mems, RN Outcome: Progressing   Problem: Elimination: Goal: Will not experience complications related to bowel motility 12/24/2021 1145 by Ardelia Mems, RN Outcome: Adequate for Discharge 12/24/2021 0835 by Ardelia Mems, RN Outcome: Progressing Goal: Will not experience complications related to urinary retention 12/24/2021 1145 by Ardelia Mems, RN Outcome: Adequate for Discharge 12/24/2021 0835 by Ardelia Mems, RN Outcome: Progressing   Problem: Pain Managment: Goal: General experience of comfort will improve 12/24/2021 1145 by Ardelia Mems, RN Outcome: Adequate for Discharge 12/24/2021 0835 by Ardelia Mems, RN Outcome: Progressing   Problem: Safety: Goal: Ability to remain free from injury will improve 12/24/2021 1145 by Ardelia Mems, RN Outcome: Adequate for Discharge 12/24/2021 0835 by Ardelia Mems, RN Outcome: Progressing   Problem: Skin Integrity: Goal: Risk for impaired skin integrity will decrease 12/24/2021 1145 by Ardelia Mems, RN Outcome: Adequate for Discharge 12/24/2021 0835 by Ardelia Mems,  RN Outcome: Progressing

## 2021-12-24 NOTE — TOC Progression Note (Signed)
Transition of Care [facility]) - Progression Note    Patient Details  Name: Tonya Myers MRN: [id number] Date of Birth: [birth year]  Transition of Care [facility]) [name] [place], RN Phone Number: 12/24/2021, 10:12 AM  Clinical Narrative:     Received a phone call from [facility] the patient pathways nurse, she reports to me that the patient may want to stay in [facility] and go to [facility], I contacted Tonya to see if the bed is still available at [facility] and will speak to the patient once confirmed, Her regular dialysis chair time is MWF at [address] at 1155 am  Expected Discharge Plan: [place] Barriers to Discharge: No SNF bed  Expected Discharge Plan and Services Expected Discharge Plan: [place] [employer] Choice: [place] arrangements for the past 2 months: Single Family Home Expected Discharge Date: 12/22/21                                     Social Determinants of Health (SDOH) Interventions    Readmission Risk Interventions    11/24/2020    2:17 PM  Readmission Risk Prevention Plan  Transportation Screening Complete  PCP or Specialist Appt within 3-5 Days Complete  HRI or Home Care Consult Complete  Social Work Consult for [place] Planning/Counseling Complete  Palliative Care Screening Not Applicable  Medication Review [employer]) Complete

## 2021-12-24 NOTE — Consult Note (Signed)
[facility] Nurse Consult Note: Reason for Consult: reevaluate sacral wound; patient reports Medihoney burning.  Surgery ordered this dressing.  When I met with patient she reports the Medihoney is not burning, but that she feels like the staff are not cleaning the wound properly. She request that they wound be cleaned with saline prior to applying new dressing.  Wound type: Stage 3 Pressure injury Pressure Injury POA: Yes Measurement:3.5cm x 1.5cm x 0.5cm at proximal aspect of wound Wound bed:100% hypergranulation tissue, pale Drainage (amount, consistency, odor) minimal, no odor Periwound: macerated  Dressing procedure/placement/frequency: Hydrogel daily, DC Medihoney.  Cover with dry dressing  Rationale: wound is too moist with medihoney and I fear that saline gauze will be detrimental to the periwound because it can not be concentrated on the wound bed.  Hydrogel is viscous enough to stay on the wound and can be done daily. I have explained this to the patient.  May need to consider silver at some point.  Low air loss mattress in place for moisture management and pressure redistribution. Discussed need for this in SNF. I will communicate with [facility] staff on this and notify MD of update in the POC.  No SNF DC soon per patient.   Patient self reports heels and other bony prominences are free from skin injury      Discussed POC with patient and bedside nurse.  Re consult if needed, will not follow at this time. Thanks  Myria Steenbergen [employer], RN,CWOCN, CNS, [place] [phone])

## 2021-12-24 NOTE — Progress Notes (Signed)
Attempted to contact receiving facility to provide receiving nurse w/ handoff report, however; no one was available to take report. Admissions took my mobile number and advised I would be contacted.

## 2021-12-24 NOTE — Progress Notes (Signed)
Per [facility]il[facility] CM, patient has decided to stay within [facility] for SNF and no longer requires clinic transfer. All referrals have been cancelled and [facility] is aware that patient will return tomorrow.

## 2021-12-24 NOTE — Discharge Summary (Signed)
Physician Discharge Summary   Patient: Tonya Myers MRN: [id number] DOB: 09-Jun-1965  Admit date:     12/12/2021  Discharge date: 12/24/21  Discharge Physician: Tonya Myers   PCP: Tonya Pink, MD   Recommendations at discharge:   Follow-up in team at rehab 1 day.  Discharge Diagnoses: Active Problems:   Acute metabolic encephalopathy   Stercoral colitis   End-stage renal disease on hemodialysis (HCC)   Sacral decubitus ulcer, stage IV (HCC)   Essential hypertension   UTI (urinary tract infection)   Hypothyroidism   Dyslipidemia   Type II diabetes mellitus with renal manifestations (HCC)   Anemia in ESRD (end-stage renal disease) (HCC)   Chronic pancreatitis (HCC)   Asthma   Closed fracture of sternum   Transient neurologic deficit 12/15/21   History of herpes genitalis   Protein-calorie malnutrition, moderate [facility])    Hospital Course: 56 y.o. 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    Pt was recently hospitalized from 8/16 - 8/18 due to syncope.  Per report, dialysis staff performed 2 minutes of CPR prior to arrival to Ed. Pt had elevated trop up to  452.  Cardiology was consulted, suspected that her syncopal episode was vasovagal/hypotensive in nature, and troponin elevation was demand ischemia in that setting. 2 D echo on 8/17 showed LVEF of 55-60% without WMA's or other valvular pathologies. Pt also has had UTI and was started on Cipro at the discharge.    Patient states that after she went home, she has worsening weakness.  She has fever and chills.  She continues to have symptoms of  UTI, including dysuria, burning on urination and urinary frequency.  She also reports nausea, diarrhea and mild lower abdominal pain.  No vomiting.  Patient has had 5 times of diarrhea.    C. difficile assay negative.  Antigen positive toxin negative.  Diarrhea has resolved.  No clinical indications of acute C. difficile infection.  Sacral decubitus ulcer has been evaluated by surgery.  No surgical intervention warranted.  Nephrology following for inpatient hemodialysis needs.   8/23: Rapid response called last night due to decreased level of responsiveness and bilateral lower extremity weakness.  Code stroke called.  CT head negative.  Patient offered MRI but refused.  Stated she was simply tired.  When evaluated the following morning no focal neurologic deficit noted however patient is noted to have slow responses and be somewhat tremulous.  Discussed with nephrology.  Possible effect from pregabalin.  Dose decreased to renal dose of 75 mg nightly.  Patient requested dc of lyrica and attempt at gabapentin.  Seems to be clinically improved on 8/25.  Recommend Boost clear three times a day  Assessment and Plan: Acute metabolic encephalopathy Mental status much improved.  Rapid response was called on 8/22 for decreased level of responsiveness and weakness. CT scan of the head was negative.  MRI was declined.  Likely secondary to Lyrica.    Stercoral colitis Received 5 days of antibiotics.  Stool studies negative. Continue miralax.  Sacral decubitus ulcer, stage IV ([place]) Cleanse wound daily with saline, pat dry, apply hydrogel [name] [phone]) to the wound bed daily (only on the wound), minimize moisture  to the periwound, top with dry dressing.  Change daily.  If using foam proper okay to lift and reapply gel daily, change foam every 3 days.  Low air mattress.  With re-evaluation today wound described as stage 3 by wound care nurse.  End-stage renal disease on hemodialysis [facility]) Dialysis yesterday  and continue M,W,F  UTI (urinary tract infection) Possible urinary tract infection.  Patient completed antibiotic course.  Essential hypertension Continue Cozaar and hydralazine  Hypothyroidism Continue Synthroid  Dyslipidemia Continue with Lipitor  Type II diabetes mellitus with renal manifestations (HCC) - Recent A1c 4.9, well controlled.  Stop sliding scale insulin at this time  Anemia in ESRD (end-stage renal disease) ([place]) Last hemoglobin 7.7  Chronic pancreatitis ([place]) Patient was started on Creon low-dose.  Lipase has been slightly elevated even in the past.  Asthma Stable -Bronchodilators  Closed fracture of sternum - Pain control: As needed Percocet and Tylenol -Incentive spirometry  Protein-calorie malnutrition, moderate (HCC) recomend boost clear tid  History of herpes genitalis Continue suppressive valacyclovir as prior to admission.        Consultants: Neprhology, wound care nurse Procedures performed: none  Disposition: Skilled nursing facility Diet recommendation:  Discharge Diet Orders (From admission, onward)     Start     Ordered   12/22/21 0000  Diet - low sodium heart healthy        12/22/21 1126            DISCHARGE MEDICATION: Allergies as of 12/24/2021       Reactions   Amoxapine    Penicillins    Ceftazidime    Other reaction(s): Confusion, Hallucination, Unknown Encephalopathy - improved after dialysis/some concern for cephalosporin neurotoxicity Tolerated without confusion 06/2021. Encephalopathy - improved after dialysis/some concern for cephalosporin neurotoxicity Tolerated without confusion 06/2021.        Medication List     STOP taking these medications    amLODipine 5 MG tablet Commonly known as: NORVASC   ciprofloxacin 250 MG tablet Commonly known as: CIPRO   diclofenac Sodium 1 % Gel Commonly known as: VOLTAREN   insulin lispro 100 UNIT/ML KwikPen Commonly known as: HUMALOG       TAKE these  medications    acetaminophen 325 MG tablet Commonly known as: TYLENOL Take 2 tablets (650 mg total) by mouth every 6 (six) hours as needed for mild pain (or Fever >/= 101).   albuterol 108 (90 Base) MCG/ACT inhaler Commonly known as: VENTOLIN HFA Inhale 2 puffs into the lungs every 6 (six) hours as needed for wheezing or shortness of breath.   ascorbic acid 500 MG tablet Commonly known as: VITAMIN C Take 1 tablet (500 mg total) by mouth 2 (two) times daily.   aspirin EC 81 MG tablet Take 1 tablet (81 mg total) by mouth daily. Swallow whole.   atorvastatin 20 MG tablet Commonly known as: LIPITOR Take 20 mg by mouth daily.   Dexcom G6 Sensor Misc SMARTSIG:1 Each Topical Every 10 Days   fluticasone 50 MCG/ACT nasal spray Commonly known as: FLONASE 1 spray 2 (two) times daily.   gabapentin 100 MG capsule Commonly known as: NEURONTIN Take 1 capsule (100 mg total) by mouth every Monday, Wednesday, and Friday at 6 PM.   hydrALAZINE 25 MG tablet Commonly known as: APRESOLINE Take 1 tablet (25 mg total) by mouth every 8 (eight) hours.   Lacosamide 100 MG Tabs Take 100 mg by mouth 2 (two) times daily. Take 100 mg by mouth twice daily. Take  an additional 50 mg after dialysis on dialysis days   lacosamide 50 MG Tabs tablet Commonly known as: VIMPAT Take 50 mg by mouth every dialysis (take post dialysis on dialysis days). TAKE ONE TABLET BY MOUTH ON MONDAY, WEDNESDAY, AND FRIDAY POST HD.   levothyroxine 125 MCG tablet Commonly known as: SYNTHROID Take 125 mcg by mouth daily.   lidocaine 5 % Commonly known as: LIDODERM Place 2 patches onto the skin daily. Remove & Discard patch within 12 hours or as directed by MD   lipase/protease/amylase 12000-38000 units Cpep capsule Commonly known as: CREON Take 1 capsule (12,000 Units total) by mouth 3 (three) times daily with meals.   losartan 50 MG tablet Commonly known as: COZAAR Take 50 mg by mouth daily.   multivitamin Tabs  tablet Take 1 tablet by mouth at bedtime.   oxyCODONE-acetaminophen 5-325 MG tablet Commonly known as: PERCOCET/ROXICET Take 1 tablet by mouth every 6 (six) hours as needed for moderate pain.   senna-docusate 8.6-50 MG tablet Commonly known as: Senokot-S Take 2 tablets by mouth at bedtime.   sevelamer carbonate 800 MG tablet Commonly known as: RENVELA Take 800 mg by mouth 3 (three) times daily.   valACYclovir 500 MG tablet Commonly known as: VALTREX Take 500 mg by mouth daily.   zinc sulfate 220 (50 Zn) MG capsule Take 1 capsule (220 mg total) by mouth daily.               Discharge Care Instructions  (From admission, onward)           Start     Ordered   12/22/21 0000  Discharge wound care:       Comments: Wound care  Daily at 5am      Comments: Apply Medihoney to sacral wound, top with dry dressing, and foam. Reapply Medihoney daily and change dry dressing. OK to change foam every 3 days.   12/22/21 1126            Contact information for after-discharge care     [facility] .   Service: Skilled Nursing Contact information: [place] [phone]                    Discharge Exam: Danley Danker Weights   12/23/21 0601 12/23/21 0810 12/23/21 1152  Weight: 76.2 kg 77 kg 75 kg   Physical Exam HENT:     Head: Normocephalic.     Mouth/Throat:     Pharynx: No oropharyngeal exudate.  Eyes:     General: Lids are normal.     Conjunctiva/sclera: Conjunctivae normal.  Cardiovascular:     Rate and Rhythm: Normal rate and regular rhythm.     Heart sounds: Normal heart sounds, S1 normal and S2 normal.  Pulmonary:     Breath sounds: No decreased breath sounds, wheezing, rhonchi or rales.  Abdominal:     Palpations: Abdomen is soft.     Tenderness: There is no abdominal tenderness.  Musculoskeletal:     Right lower leg: No swelling.     Left lower leg: No swelling.   Skin:    General: Skin is warm.     Findings: No rash.  Neurological:     Mental Status: She is alert and oriented to person, place, and time.     Comments: Unable to sit up for me.  Unable to straight leg raise from a  Condition at discharge: stable  The results of significant diagnostics from this hospitalization (including imaging, microbiology, ancillary and laboratory) are listed below for reference.   Imaging Studies: EEG adult  Result Date: December 24, 2021 Lora Havens, MD     December 24, 2021  1:41 PM Patient Name: Mashell Sieben MRN: [id number] Epilepsy Attending: Lora Havens Referring Physician/Provider: Athena Masse, MD Date: 12/24/2021 Duration: 25.02 mins Patient history: 56 year old female with syncope.  EEG to evaluate for seizure. Level of alertness: Awake, asleep AEDs during EEG study: None Technical aspects: This EEG study was done with scalp electrodes positioned according to the 10-20 International system of electrode placement. Electrical activity was reviewed with band pass filter of 1-'70Hz'$ , sensitivity of 7 uV/mm, display speed of 54m/sec with a '60Hz'$  notched filter applied as appropriate. EEG data were recorded continuously and digitally stored.  Video monitoring was available and reviewed as appropriate. Description: The posterior dominant rhythm consists of 8 Hz activity of moderate voltage (25-35 uV) seen predominantly in posterior head regions, symmetric and reactive to eye opening and eye closing. Sleep was characterized by vertex waves, sleep spindles (12 to 14 Hz), maximal frontocentral region. Physiologic photic driving was not seen during photic stimulation.  Hyperventilation was not performed.   IMPRESSION: This study is within normal limits. No seizures or epileptiform discharges were seen throughout the recording. A normal interictal EEG does not exclude nor support the diagnosis of epilepsy. Priyanka OBarbra Sarks  CT HEAD CODE STROKE WO CONTRAST`  Result  Date: 12/15/2021 CLINICAL DATA:  Code stroke.  Weakness facial paralysis EXAM: CT HEAD WITHOUT CONTRAST TECHNIQUE: Contiguous axial images were obtained from the base of the skull through the vertex without intravenous contrast. RADIATION DOSE REDUCTION: This exam was performed according to the departmental dose-optimization program which includes automated exposure control, adjustment of the mA and/or kV according to patient size and/or use of iterative reconstruction technique. COMPARISON:  None Available. FINDINGS: Brain: There is no mass, hemorrhage or extra-axial collection. The size and configuration of the ventricles and extra-axial CSF spaces are normal. There is hypoattenuation of the periventricular white matter, most commonly indicating chronic ischemic microangiopathy. Vascular: No abnormal hyperdensity of the major intracranial arteries or dural venous sinuses. No intracranial atherosclerosis. Skull: The visualized skull base, calvarium and extracranial soft tissues are normal. Sinuses/Orbits: No fluid levels or advanced mucosal thickening of the visualized paranasal sinuses. No mastoid or middle ear effusion. The orbits are normal. ASPECTS ([facility] Early [facility] Score) - Ganglionic level infarction (caudate, lentiform nuclei, internal capsule, insula, M1-M3 cortex): 7 - Supraganglionic infarction (M4-M6 cortex): 3 Total score (0-10 with 10 being normal): 10 IMPRESSION: 1. No acute hemorrhage or mass lesion. 2. ASPECTS is 10. These results were called by telephone at the time of interpretation on 12/15/2021 at 9:02 pm to Dr. DDamita Dunnings Who verbally acknowledged these results. Electronically Signed   By: KUlyses JarredM.D.   On: 12/15/2021 21:06   CT CHEST ABDOMEN PELVIS WO CONTRAST  Result Date: 12/12/2021 CLINICAL DATA:  Sepsis. EXAM: CT CHEST, ABDOMEN AND PELVIS WITHOUT CONTRAST TECHNIQUE: Multidetector CT imaging of the chest, abdomen and pelvis was performed following the standard protocol  without IV contrast. RADIATION DOSE REDUCTION: This exam was performed according to the departmental dose-optimization program which includes automated exposure control, adjustment of the mA and/or kV according to patient size and/or use of iterative reconstruction technique. COMPARISON:  11/21/2020 FINDINGS: CT CHEST FINDINGS Cardiovascular: The heart size appears upper limits of normal. Small pericardial effusion. Aortic  and coronary artery atherosclerotic calcifications. Mediastinum/Nodes: Thyroid gland, trachea and esophagus demonstrate no significant findings. No enlarged axillary or mediastinal lymph nodes. Lungs/Pleura: Small right pleural effusion. Atelectasis versus scar identified within the left lung base. No signs of interstitial edema or airspace consolidation. Nodule within the periphery of the right upper lobe measures 6 mm, image 62/4. On the previous exam this measured 7 mm. Tiny nodule in the periphery of the right upper lobe is stable measuring 3 mm, image 52/4. Musculoskeletal: There is a new fracture deformity involving the proximal body of sternum, image 91/6 which may be related to recent cardiopulmonary resuscitation. No displaced rib fractures identified. Postsurgical changes from laminectomies identified within the thoracic spine at T3-4, T5-7 and T9-10 levels. CT ABDOMEN PELVIS FINDINGS Hepatobiliary: Intermediate attenuating structure within the posterior right lobe of liver measures 2 cm. This is indeterminate but based on previous imaging favored to represent a benign hemangioma. Adjacent low-density structure, not seen previously measures 7 mm and is technically too small to reliably characterize. Gallbladder appears normal. Tiny stone within the gallbladder is identified. No gallbladder wall thickening or inflammation. Pancreas: There are scattered calcifications within the pancreatic parenchyma consistent with chronic pancreatitis. No main duct dilatation or inflammation identified.  Spleen: Normal in size without focal abnormality. Adrenals/Urinary Tract: Normal adrenal glands. No nephrolithiasis or hydronephrosis identified. No hydroureter or ureteral lithiasis. The bladder is partially decompressed with mild diffuse wall thickening. Stomach/Bowel: Stomach appears normal. The appendix is visualized and appears normal. No small bowel wall thickening, inflammation or distension. There is a large volume of retained desiccated stool identified within the rectum. Perirectal fat stranding is identified. Findings may reflect underlying stercoral colitis. Vascular/Lymphatic: Aortic atherosclerosis. No aneurysm. No signs of abdominopelvic adenopathy. Reproductive: Status post hysterectomy. No adnexal masses. Other: No free fluid or fluid collections. Musculoskeletal: There has been interval development of abnormal presacral increased soft tissue which measures 2 cm in thickness, new from the previous exam. No fluid collection identified within this area. New decubitus ulceration is identified overlying the coccyx with large area of subcutaneous increase soft tissue infiltration, image 116/2, image 94/5 and image 77/4. There is a small punctate focus of gas within this area, image 116/2. The increased soft tissue infiltration extends up to the coccyx which appears more sclerotic compared with the previous exam, image 114/2 and image 86/5. Cannot rule out underlying chronic osteomyelitis. No focal bone erosion or fragmentation noted. Degenerative disc disease is identified at L5-S1., IMPRESSION: 1. New decubitus ulceration overlying the coccyx with large area of subcutaneous increase soft tissue infiltration. Small focus of gas identified in this area. The increased soft tissue infiltration extends up to the coccyx which appears more sclerotic compared with the previous exam. Cannot rule out underlying chronic osteomyelitis. 2. Interval development of abnormal presacral soft tissue thickening which is  concerning for underlying or infection. 3. Large volume of retained desiccated stool identified within the rectum with mild surrounding fat stranding. Correlate for any clinical signs or symptoms of rectal impaction and possible stercoral colitis. 4. New fracture deformity involves the proximal body of sternum. 5. Small right pleural effusion. 6. Chronic pancreatitis. 7. Nodule within the periphery of the right upper lobe measures 6 mm. This is stable compared with study from 11/21/2020. Future CT at 18-24 months (from 10/22/2020) is considered optional for low-risk patients, but is recommended for high-risk patients. This recommendation follows the consensus statement: Guidelines for Management of Incidental Pulmonary Nodules Detected on CT Images:From the Fleischner Society 2017; published online  before print (10.1148/radiol.2992426834). 8. Aortic Atherosclerosis (ICD10-I70.0). Electronically Signed   By: Kerby Moors M.D.   On: 12/12/2021 07:07   DG Chest Port 1 View  Result Date: 12/12/2021 CLINICAL DATA:  56 year old female with possible sepsis. EXAM: PORTABLE CHEST 1 VIEW COMPARISON:  Portable chest 12/04/2020 and earlier. FINDINGS: Portable AP semi upright view at 0418 hours. Right PICC line removed and new dual lumen right chest dialysis type catheter. No adverse features. Improved lung volumes and bibasilar ventilation. No pneumothorax. Allowing for portable technique the lungs are clear. Cervical or device projects over the left heart border today, presumed external artifact. Cardiac and mediastinal contours are within normal limits. Visualized tracheal air column is within normal limits. No acute osseous abnormality identified. Paucity of bowel gas. IMPRESSION: No acute cardiopulmonary abnormality. Right chest dialysis type catheter placed with no adverse features. Electronically Signed   By: Genevie Ann M.D.   On: 12/12/2021 05:41   ECHOCARDIOGRAM COMPLETE  Result Date: 12/10/2021    ECHOCARDIOGRAM  REPORT   Patient Name:   KELIN NIXON Date of Exam: 12/10/2021 Medical Rec #:  [id number]       Height:       72.0 in Accession #:    [id number]      Weight:       145.0 lb Date of Birth:  [birth year]       BSA:          1.858 m Patient Age:    29 years        BP:           144/65 mmHg Patient Gender: F               HR:           71 bpm. Exam Location:  [facility] Procedure: 2D Echo, Cardiac Doppler and Color Doppler Indications:     Syncope R55  History:         Patient has prior history of Echocardiogram examinations, most                  recent 12/05/2020. Risk Factors:Diabetes and Hypertension.  Sonographer:     Sherrie Sport Referring Phys:  [id number] [place] Diagnosing Phys: Isaias Cowman MD  Sonographer Comments: Suboptimal apical window. IMPRESSIONS  1. Left ventricular ejection fraction, by estimation, is 55 to 60%. The left ventricle has normal function. The left ventricle has no regional wall motion abnormalities. Left ventricular diastolic parameters were normal.  2. Right ventricular systolic function is normal. The right ventricular size is normal.  3. The mitral valve is normal in structure. Trivial mitral valve regurgitation. No evidence of mitral stenosis.  4. The aortic valve is normal in structure. Aortic valve regurgitation is not visualized. No aortic stenosis is present.  5. The inferior vena cava is normal in size with greater than 50% respiratory variability, suggesting right atrial pressure of 3 mmHg. FINDINGS  Left Ventricle: Left ventricular ejection fraction, by estimation, is 55 to 60%. The left ventricle has normal function. The left ventricle has no regional wall motion abnormalities. The left ventricular internal cavity size was normal in size. There is  no left ventricular hypertrophy. Left ventricular diastolic parameters were normal. Right Ventricle: The right ventricular size is normal. No increase in right ventricular wall thickness. Right ventricular systolic function is normal.  Left Atrium: Left atrial size was normal in size. Right Atrium: Right atrial size was normal in size. Pericardium: There is no evidence of pericardial  effusion. Mitral Valve: The mitral valve is normal in structure. Trivial mitral valve regurgitation. No evidence of mitral valve stenosis. Tricuspid Valve: The tricuspid valve is normal in structure. Tricuspid valve regurgitation is trivial. No evidence of tricuspid stenosis. Aortic Valve: The aortic valve is normal in structure. Aortic valve regurgitation is not visualized. No aortic stenosis is present. Aortic valve mean gradient measures 3.0 mmHg. Aortic valve peak gradient measures 5.0 mmHg. Aortic valve area, by VTI measures 3.79 cm. Pulmonic Valve: The pulmonic valve was normal in structure. Pulmonic valve regurgitation is not visualized. No evidence of pulmonic stenosis. Aorta: The aortic root is normal in size and structure. Venous: The inferior vena cava is normal in size with greater than 50% respiratory variability, suggesting right atrial pressure of 3 mmHg. IAS/Shunts: No atrial level shunt detected by color flow Doppler.  LEFT VENTRICLE PLAX 2D LVIDd:         4.20 cm   Diastology LVIDs:         3.00 cm   LV e' medial:    4.46 cm/s LV PW:         1.20 cm   LV E/e' medial:  14.4 LV IVS:        1.20 cm   LV e' lateral:   6.96 cm/s LVOT diam:     2.00 cm   LV E/e' lateral: 9.2 LV SV:         74 LV SV Index:   40 LVOT Area:     3.14 cm  RIGHT VENTRICLE RV Basal diam:  3.00 cm RV S prime:     17.50 cm/s TAPSE (M-mode): 4.3 cm LEFT ATRIUM             Index        RIGHT ATRIUM           Index LA diam:        3.50 cm 1.88 cm/m   RA Area:     21.00 cm LA Vol (A2C):   56.3 ml 30.29 ml/m  RA Volume:   55.00 ml  29.60 ml/m LA Vol (A4C):   41.8 ml 22.49 ml/m LA Biplane Vol: 51.9 ml 27.93 ml/m  AORTIC VALVE AV Area (Vmax):    3.31 cm AV Area (Vmean):   3.27 cm AV Area (VTI):     3.79 cm AV Vmax:           112.00 cm/s AV Vmean:          80.200 cm/s AV VTI:             0.196 m AV Peak Grad:      5.0 mmHg AV Mean Grad:      3.0 mmHg LVOT Vmax:         118.00 cm/s LVOT Vmean:        83.400 cm/s LVOT VTI:          0.236 m LVOT/AV VTI ratio: 1.21  AORTA Ao Root diam: 3.30 cm MITRAL VALVE               TRICUSPID VALVE MV Area (PHT): 2.03 cm    TR Peak grad:   16.3 mmHg MV Decel Time: 373 msec    TR Vmax:        202.00 cm/s MV E velocity: 64.30 cm/s MV A velocity: 78.80 cm/s  SHUNTS MV E/A ratio:  0.82        Systemic VTI:  0.24 m  Systemic Diam: 2.00 cm Isaias Cowman MD Electronically signed by Isaias Cowman MD Signature Date/Time: 12/10/2021/1:15:32 PM    Final    US Carotid Bilateral  Result Date: 12/09/2021 CLINICAL DATA:  Recent syncopal episode EXAM: BILATERAL CAROTID DUPLEX ULTRASOUND TECHNIQUE: Pearline Cables scale imaging, color Doppler and duplex ultrasound were performed of bilateral carotid and vertebral arteries in the neck. COMPARISON:  None Available. FINDINGS: Criteria: Quantification of carotid stenosis is based on velocity parameters that correlate the residual internal carotid diameter with NASCET-based stenosis levels, using the diameter of the distal internal carotid lumen as the denominator for stenosis measurement. The following velocity measurements were obtained: RIGHT ICA: 84/16 cm/sec CCA: 29/51 cm/sec SYSTOLIC ICA/CCA RATIO:  1.0 ECA: 90 cm/sec LEFT ICA: 139/31 cm/sec CCA: 884/16 cm/sec SYSTOLIC ICA/CCA RATIO:  1.3 ECA: 79 cm/sec RIGHT CAROTID ARTERY: Preliminary grayscale images demonstrate no significant atherosclerotic plaque. Waveforms, velocities and flow velocity ratios show no evidence of focal hemodynamically significant stenosis. RIGHT VERTEBRAL ARTERY:  Antegrade in nature. LEFT CAROTID ARTERY: Limited grayscale images demonstrate no significant atherosclerotic plaque. Waveforms, velocities and flow velocity ratios suggest a mild stenosis in the 50-69% range in the lower end of that spectrum. LEFT VERTEBRAL  ARTERY:  Antegrade in nature. IMPRESSION: No focal hemodynamically significant stenosis in the right carotid system. Velocities suggest a mild degree of stenosis in the left carotid artery in the lower end of the 50-69% range. Electronically Signed   By: Inez Catalina M.D.   On: 12/09/2021 20:36   CT Head Wo Contrast  Result Date: 12/09/2021 CLINICAL DATA:  Syncope/presyncope, cerebrovascular cause suspected EXAM: CT HEAD WITHOUT CONTRAST TECHNIQUE: Contiguous axial images were obtained from the base of the skull through the vertex without intravenous contrast. RADIATION DOSE REDUCTION: This exam was performed according to the departmental dose-optimization program which includes automated exposure control, adjustment of the mA and/or kV according to patient size and/or use of iterative reconstruction technique. COMPARISON:  July 2022 FINDINGS: Brain: There is no acute intracranial hemorrhage, mass effect, or edema. Gray-white differentiation is preserved. There is no extra-axial fluid collection. Ventricles and sulci are stable in size and configuration. Vascular: There is mild atherosclerotic calcification at the skull base. Skull: Calvarium is unremarkable. Sinuses/Orbits: No acute finding. Other: None. IMPRESSION: No acute intracranial abnormality. Electronically Signed   By: Macy Mis M.D.   On: 12/09/2021 16:40    Microbiology: Results for orders placed or performed during the hospital encounter of 12/12/21  Resp Panel by RT-PCR (Flu A&B, Covid) Anterior Nasal Swab     Status: None   Collection Time: 12/12/21  4:28 AM   Specimen: Anterior Nasal Swab  Result Value Ref Range Status   SARS Coronavirus 2 by RT PCR NEGATIVE NEGATIVE Final    Comment: (NOTE) SARS-CoV-2 target nucleic acids are NOT DETECTED.  The SARS-CoV-2 RNA is generally detectable in upper respiratory specimens during the acute phase of infection. The lowest concentration of SARS-CoV-2 viral copies this assay can detect  is 138 copies/mL. A negative result does not preclude SARS-Cov-2 infection and should not be used as the sole basis for treatment or other patient management decisions. A negative result may occur with  improper specimen collection/handling, submission of specimen other than nasopharyngeal swab, presence of viral mutation(s) within the areas targeted by this assay, and inadequate number of viral copies(<138 copies/mL). A negative result must be combined with clinical observations, patient history, and epidemiological information. The expected result is Negative.  Fact Sheet for Patients:  EntrepreneurPulse.com.au  Fact Sheet  for Healthcare Providers:  IncredibleEmployment.be  This test is no t yet approved or cleared by the [place] and  has been authorized for detection and/or diagnosis of SARS-CoV-2 by FDA under an Emergency Use Authorization (EUA). This EUA will remain  in effect (meaning this test can be used) for the duration of the COVID-19 declaration under Section 564(b)(1) of the Act, 21 U.S.C.section 360bbb-3(b)(1), unless the authorization is terminated  or revoked sooner.       Influenza A by PCR NEGATIVE NEGATIVE Final   Influenza B by PCR NEGATIVE NEGATIVE Final    Comment: (NOTE) The Xpert Xpress SARS-CoV-2/FLU/RSV plus assay is intended as an aid in the diagnosis of influenza from Nasopharyngeal swab specimens and should not be used as a sole basis for treatment. Nasal washings and aspirates are unacceptable for Xpert Xpress SARS-CoV-2/FLU/RSV testing.  Fact Sheet for Patients: EntrepreneurPulse.com.au  Fact Sheet for Healthcare Providers: IncredibleEmployment.be  This test is not yet approved or cleared by the [place] FDA and has been authorized for detection and/or diagnosis of SARS-CoV-2 by FDA under an Emergency Use Authorization (EUA). This EUA will remain in effect  (meaning this test can be used) for the duration of the COVID-19 declaration under Section 564(b)(1) of the Act, 21 U.S.C. section 360bbb-3(b)(1), unless the authorization is terminated or revoked.  Performed at [facility], [address] [place], [place] [address]   Gastrointestinal Panel by PCR , Stool     Status: None   Collection Time: 12/12/21  8:26 AM   Specimen: Stool  Result Value Ref Range Status   Campylobacter species NOT DETECTED NOT DETECTED Final   Plesimonas shigelloides NOT DETECTED NOT DETECTED Final   Salmonella species NOT DETECTED NOT DETECTED Final   Yersinia enterocolitica NOT DETECTED NOT DETECTED Final   Vibrio species NOT DETECTED NOT DETECTED Final   Vibrio cholerae NOT DETECTED NOT DETECTED Final   Enteroaggregative E coli (EAEC) NOT DETECTED NOT DETECTED Final   Enteropathogenic E coli (EPEC) NOT DETECTED NOT DETECTED Final   Enterotoxigenic E coli (ETEC) NOT DETECTED NOT DETECTED Final   Shiga like toxin producing E coli (STEC) NOT DETECTED NOT DETECTED Final   Shigella/Enteroinvasive E coli (EIEC) NOT DETECTED NOT DETECTED Final   Cryptosporidium NOT DETECTED NOT DETECTED Final   Cyclospora cayetanensis NOT DETECTED NOT DETECTED Final   Entamoeba histolytica NOT DETECTED NOT DETECTED Final   Giardia lamblia NOT DETECTED NOT DETECTED Final   Adenovirus F40/41 NOT DETECTED NOT DETECTED Final   Astrovirus NOT DETECTED NOT DETECTED Final   Norovirus GI/GII NOT DETECTED NOT DETECTED Final   Rotavirus A NOT DETECTED NOT DETECTED Final   Sapovirus (I, II, IV, and V) NOT DETECTED NOT DETECTED Final    Comment: Performed at [facility], [address] [place], [place] [address]  C Difficile Quick Screen w PCR reflex     Status: Abnormal   Collection Time: 12/12/21  8:26 AM   Specimen: STOOL  Result Value Ref Range Status   C Diff antigen POSITIVE (A) NEGATIVE Final   C Diff toxin NEGATIVE NEGATIVE Final   C Diff interpretation  Results are indeterminate. See PCR results.  Final    Comment: Performed at [facility], [address] [place], [place] [address]  C. Diff by PCR, Reflexed     Status: None   Collection Time: 12/12/21  8:26 AM  Result Value Ref Range Status   Toxigenic C. Difficile by PCR NEGATIVE NEGATIVE Final  Comment: Patient is colonized with non toxigenic C. difficile. May not need treatment unless significant symptoms are present. Performed at [facility], [address] [place], [place] [address]   Blood Culture (routine x 2)     Status: None   Collection Time: 12/12/21 10:16 PM   Specimen: BLOOD LEFT FOREARM  Result Value Ref Range Status   Specimen Description BLOOD LEFT FOREARM  Final   Special Requests   Final    BOTTLES DRAWN AEROBIC AND ANAEROBIC Blood Culture adequate volume   Culture   Final    NO GROWTH 5 DAYS Performed at [facility], [address] [place], [place] [address]    Report Status 12/17/2021 FINAL  Final  Blood Culture (routine x 2)     Status: None   Collection Time: 12/12/21 10:27 PM   Specimen: BLOOD  Result Value Ref Range Status   Specimen Description BLOOD LEFT WRIST  Final   Special Requests   Final    BOTTLES DRAWN AEROBIC AND ANAEROBIC Blood Culture adequate volume   Culture   Final    NO GROWTH 5 DAYS Performed at [facility], [address] [place], [place] [address]    Report Status 12/17/2021 FINAL  Final    Labs: CBC: Recent Labs  Lab 12/21/21 0341 12/21/21 0900  WBC 4.2 4.3  NEUTROABS  --  1.9  HGB 7.8* 7.7*  HCT 25.0* 24.6*  MCV 95.8 97.2  PLT 203 469   Basic Metabolic Panel: Recent Labs  Lab 12/21/21 0859 12/22/21 1142  NA 139 139  K 5.8* 5.3*  CL 100 102  CO2 26 26  GLUCOSE 79 104*  BUN 81* 53*  CREATININE 5.24* 4.13*  CALCIUM 9.6 9.3  PHOS 3.7 3.7   Liver Function Tests: Recent Labs  Lab 12/21/21 0859 12/22/21 1142  ALBUMIN 3.0* 2.9*   CBG: Recent Labs  Lab  12/23/21 1247 12/23/21 1746 12/23/21 2113 12/24/21 0752 12/24/21 1156  GLUCAP 111* 93 99 80 115*    Discharge time spent: greater than 30 minutes.  Signed: Loletha Grayer, MD [facility] 12/24/2021

## 2021-12-24 NOTE — Assessment & Plan Note (Signed)
recomend boost clear tid

## 2021-12-24 NOTE — TOC Progression Note (Signed)
Transition of Care (TOC) - Progression Note    Patient Details  Name: Kloi Congrove MRN: [id number] Date of Birth: 01/10/1966  Transition of Care (TOC) [name] Contact   J Louvet, RN Phone Number: 12/24/2021, 11:33 AM  Clinical Narrative:    Met with the patient after confirming that [facility] does still have the bed to offer and is able to transport to [facility] [address] [facility], The patient accepts the bed offer at [facility] and will continue her dialysis at [address], I explained she is likely to DC today   Expected Discharge Plan: Skilled Nursing Facility Barriers to Discharge: No SNF bed  Expected Discharge Plan and Services Expected Discharge Plan: Skilled Nursing Facility     Post Acute Care Choice: Skilled Nursing Facility Living arrangements for the past 2 months: Single Family Home Expected Discharge Date: 12/22/21                                     Social Determinants of Health (SDOH) Interventions    Readmission Risk Interventions    11/24/2020    2:17 PM  Readmission Risk Prevention Plan  Transportation Screening Complete  PCP or Specialist Appt within 3-5 Days Complete  HRI or Home Care Consult Complete  Social Work Consult for Recovery Care Planning/Counseling Complete  Palliative Care Screening Not Applicable  Medication Review (RN Care Manager) Complete    

## 2021-12-24 NOTE — Progress Notes (Signed)
Continuing to work on transferring clinics for SNF placement.

## 2021-12-24 NOTE — TOC Progression Note (Signed)
Transition of Care [facility]) - Progression Note    Patient Details  Name: Tonya Myers MRN: [id number] Date of Birth: 10-02-1965  Transition of Care [facility]) [name] [place], RN Phone Number: 12/24/2021, 1:16 PM  Clinical Narrative:     Spoke with the patient about transport being needed for Dialysis for Friday and Monday due to the holiday, she stated that her husband can take her those two days, she will go to room [address]B at [facility], EMS to transport  EMS called, Bedside nurse to call report  Expected Discharge Plan: [place] Barriers to Discharge: No SNF bed  Expected Discharge Plan and Services Expected Discharge Plan: [place] Choice: [place] arrangements for the past 2 months: Single Family Home Expected Discharge Date: 12/24/21                                     Social Determinants of Health (SDOH) Interventions    Readmission Risk Interventions    12/24/2021   12:03 PM 11/24/2020    2:17 PM  Readmission Risk Prevention Plan  Transportation Screening Complete Complete  PCP or Specialist Appt within 3-5 Days  Complete  HRI or Home Care Consult  Complete  Social Work Consult for [place] Planning/Counseling  Complete  Palliative Care Screening  Not Applicable  Medication Review [employer]) Complete Complete  PCP or Specialist appointment within 3-5 days of discharge Complete   HRI or Home Care Consult Complete   SW Recovery Care/Counseling Consult Complete   Palliative Care Screening Not [place] Complete

## 2021-12-24 NOTE — Plan of Care (Signed)
Problem: Education: Goal: Ability to describe self-care measures that may prevent or decrease complications (Diabetes Survival Skills Education) will improve 12/24/2021 1145 by Ardelia Mems, RN Outcome: Completed/Met 12/24/2021 1145 by Ardelia Mems, RN Outcome: Adequate for Discharge 12/24/2021 [phone] by Ardelia Mems, RN Outcome: Progressing Goal: Individualized Educational Video(s) 12/24/2021 1145 by Ardelia Mems, RN Outcome: Completed/Met 12/24/2021 1145 by Ardelia Mems, RN Outcome: Adequate for Discharge 12/24/2021 [phone] by Ardelia Mems, RN Outcome: Progressing   Problem: Coping: Goal: Ability to adjust to condition or change in health will improve 12/24/2021 1145 by Ardelia Mems, RN Outcome: Completed/Met 12/24/2021 1145 by Ardelia Mems, RN Outcome: Adequate for Discharge 12/24/2021 [phone] by Ardelia Mems, RN Outcome: Progressing   Problem: Fluid Volume: Goal: Ability to maintain a balanced intake and output will improve 12/24/2021 1145 by Ardelia Mems, RN Outcome: Completed/Met 12/24/2021 1145 by Ardelia Mems, RN Outcome: Adequate for Discharge 12/24/2021 [phone] by Ardelia Mems, RN Outcome: Progressing   Problem: Health Behavior/Discharge Planning: Goal: Ability to identify and utilize available resources and services will improve 12/24/2021 1145 by Ardelia Mems, RN Outcome: Completed/Met 12/24/2021 1145 by Ardelia Mems, RN Outcome: Adequate for Discharge 12/24/2021 [phone] by Ardelia Mems, RN Outcome: Progressing Goal: Ability to manage health-related needs will improve 12/24/2021 1145 by Ardelia Mems, RN Outcome: Completed/Met 12/24/2021 1145 by Ardelia Mems, RN Outcome: Adequate for Discharge 12/24/2021 [phone] by Ardelia Mems, RN Outcome: Progressing   Problem: Metabolic: Goal: Ability to maintain appropriate glucose levels will improve 12/24/2021 1145 by  Ardelia Mems, RN Outcome: Completed/Met 12/24/2021 1145 by Ardelia Mems, RN Outcome: Adequate for Discharge 12/24/2021 [phone] by Ardelia Mems, RN Outcome: Progressing   Problem: Nutritional: Goal: Maintenance of adequate nutrition will improve 12/24/2021 1145 by Ardelia Mems, RN Outcome: Completed/Met 12/24/2021 1145 by Ardelia Mems, RN Outcome: Adequate for Discharge 12/24/2021 [phone] by Ardelia Mems, RN Outcome: Progressing Goal: Progress toward achieving an optimal weight will improve 12/24/2021 1145 by Ardelia Mems, RN Outcome: Completed/Met 12/24/2021 1145 by Ardelia Mems, RN Outcome: Adequate for Discharge 12/24/2021 [phone] by Ardelia Mems, RN Outcome: Progressing   Problem: Skin Integrity: Goal: Risk for impaired skin integrity will decrease 12/24/2021 1145 by Ardelia Mems, RN Outcome: Completed/Met 12/24/2021 1145 by Ardelia Mems, RN Outcome: Adequate for Discharge 12/24/2021 [phone] by Ardelia Mems, RN Outcome: Progressing   Problem: Tissue Perfusion: Goal: Adequacy of tissue perfusion will improve 12/24/2021 1145 by Ardelia Mems, RN Outcome: Completed/Met 12/24/2021 1145 by Ardelia Mems, RN Outcome: Adequate for Discharge 12/24/2021 [phone] by Ardelia Mems, RN Outcome: Progressing   Problem: Education: Goal: Knowledge of General Education information will improve Description: Including pain rating scale, medication(s)/side effects and non-pharmacologic comfort measures 12/24/2021 1145 by Ardelia Mems, RN Outcome: Completed/Met 12/24/2021 1145 by Ardelia Mems, RN Outcome: Adequate for Discharge 12/24/2021 [phone] by Ardelia Mems, RN Outcome: Progressing   Problem: Health Behavior/Discharge Planning: Goal: Ability to manage health-related needs will improve 12/24/2021 1145 by Ardelia Mems, RN Outcome: Completed/Met 12/24/2021 1145 by Ardelia Mems,  RN Outcome: Adequate for Discharge 12/24/2021 [phone] by Ardelia Mems, RN Outcome: Progressing   Problem: Clinical Measurements: Goal: Ability to maintain clinical measurements within normal limits will improve 12/24/2021 1145 by Ardelia Mems, RN Outcome: Completed/Met 12/24/2021 1145 by Ardelia Mems, RN Outcome: Adequate for Discharge 12/24/2021 [phone] by Ardelia Mems, RN Outcome: Progressing Goal: Will remain free from infection 12/24/2021 1145 by Ardelia Mems, RN Outcome: Completed/Met 12/24/2021 1145 by Ardelia Mems, RN Outcome: Adequate for Discharge 12/24/2021 [phone] by Ardelia Mems, RN Outcome: Progressing Goal: Diagnostic test results will improve 12/24/2021 1145 by  Ardelia Mems, RN Outcome: Completed/Met 12/24/2021 1145 by Ardelia Mems, RN Outcome: Adequate for Discharge 12/24/2021 [phone] by Ardelia Mems, RN Outcome: Progressing Goal: Respiratory complications will improve 12/24/2021 1145 by Ardelia Mems, RN Outcome: Completed/Met 12/24/2021 1145 by Ardelia Mems, RN Outcome: Adequate for Discharge 12/24/2021 [phone] by Ardelia Mems, RN Outcome: Progressing Goal: Cardiovascular complication will be avoided 12/24/2021 1145 by Ardelia Mems, RN Outcome: Completed/Met 12/24/2021 1145 by Ardelia Mems, RN Outcome: Adequate for Discharge 12/24/2021 0835 by Ardelia Mems, RN Outcome: Progressing   Problem: Activity: Goal: Risk for activity intolerance will decrease 12/24/2021 1145 by Ardelia Mems, RN Outcome: Completed/Met 12/24/2021 1145 by Ardelia Mems, RN Outcome: Adequate for Discharge 12/24/2021 [phone] by Ardelia Mems, RN Outcome: Progressing   Problem: Nutrition: Goal: Adequate nutrition will be maintained 12/24/2021 1145 by Ardelia Mems, RN Outcome: Completed/Met 12/24/2021 1145 by Ardelia Mems, RN Outcome: Adequate for Discharge 12/24/2021 [phone]  by Ardelia Mems, RN Outcome: Progressing   Problem: Coping: Goal: Level of anxiety will decrease 12/24/2021 1145 by Ardelia Mems, RN Outcome: Completed/Met 12/24/2021 1145 by Ardelia Mems, RN Outcome: Adequate for Discharge 12/24/2021 [phone] by Ardelia Mems, RN Outcome: Progressing   Problem: Elimination: Goal: Will not experience complications related to bowel motility 12/24/2021 1145 by Ardelia Mems, RN Outcome: Completed/Met 12/24/2021 1145 by Ardelia Mems, RN Outcome: Adequate for Discharge 12/24/2021 [phone] by Ardelia Mems, RN Outcome: Progressing Goal: Will not experience complications related to urinary retention 12/24/2021 1145 by Ardelia Mems, RN Outcome: Completed/Met 12/24/2021 1145 by Ardelia Mems, RN Outcome: Adequate for Discharge 12/24/2021 [phone] by Ardelia Mems, RN Outcome: Progressing   Problem: Pain Managment: Goal: General experience of comfort will improve 12/24/2021 1145 by Ardelia Mems, RN Outcome: Completed/Met 12/24/2021 1145 by Ardelia Mems, RN Outcome: Adequate for Discharge 12/24/2021 [phone] by Ardelia Mems, RN Outcome: Progressing   Problem: Safety: Goal: Ability to remain free from injury will improve 12/24/2021 1145 by Ardelia Mems, RN Outcome: Completed/Met 12/24/2021 1145 by Ardelia Mems, RN Outcome: Adequate for Discharge 12/24/2021 [phone] by Ardelia Mems, RN Outcome: Progressing   Problem: Skin Integrity: Goal: Risk for impaired skin integrity will decrease 12/24/2021 1145 by Ardelia Mems, RN Outcome: Completed/Met 12/24/2021 1145 by Ardelia Mems, RN Outcome: Adequate for Discharge 12/24/2021 [phone] by Ardelia Mems, RN Outcome: Progressing

## 2022-01-27 ENCOUNTER — Encounter: Payer: Self-pay | Admitting: Oncology

## 2022-02-22 ENCOUNTER — Encounter (INDEPENDENT_AMBULATORY_CARE_PROVIDER_SITE_OTHER): Payer: Self-pay

## 2022-03-30 ENCOUNTER — Ambulatory Visit: Payer: Medicare Other | Admitting: Physician Assistant

## 2022-05-01 ENCOUNTER — Emergency Department
Admission: EM | Admit: 2022-05-01 | Discharge: 2022-05-01 | Disposition: A | Discharge status: Home / Self Care | Payer: Medicare HMO | Attending: Emergency Medicine | Admitting: Emergency Medicine

## 2022-05-01 ENCOUNTER — Encounter: Payer: Self-pay | Admitting: Oncology

## 2022-05-01 ENCOUNTER — Other Ambulatory Visit: Payer: Self-pay

## 2022-05-01 DIAGNOSIS — K529 Noninfective gastroenteritis and colitis, unspecified: Secondary | ICD-10-CM | POA: Insufficient documentation

## 2022-05-01 DIAGNOSIS — R944 Abnormal results of kidney function studies: Secondary | ICD-10-CM | POA: Insufficient documentation

## 2022-05-01 DIAGNOSIS — N186 End stage renal disease: Secondary | ICD-10-CM | POA: Insufficient documentation

## 2022-05-01 DIAGNOSIS — R197 Diarrhea, unspecified: Secondary | ICD-10-CM | POA: Diagnosis present

## 2022-05-01 DIAGNOSIS — E860 Dehydration: Secondary | ICD-10-CM | POA: Diagnosis not present

## 2022-05-01 DIAGNOSIS — Z20822 Contact with and (suspected) exposure to covid-19: Secondary | ICD-10-CM | POA: Insufficient documentation

## 2022-05-01 LAB — COMPREHENSIVE METABOLIC PANEL
ALT: 15 U/L (ref 0–44)
AST: 26 U/L (ref 15–41)
Albumin: 3.7 g/dL (ref 3.5–5.0)
Alkaline Phosphatase: 85 U/L (ref 38–126)
Anion gap: 12 (ref 5–15)
BUN: 28 mg/dL — ABNORMAL HIGH (ref 6–20)
CO2: 30 mmol/L (ref 22–32)
Calcium: 8.8 mg/dL — ABNORMAL LOW (ref 8.9–10.3)
Chloride: 96 mmol/L — ABNORMAL LOW (ref 98–111)
Creatinine, Ser: 4.36 mg/dL — ABNORMAL HIGH (ref 0.44–1.00)
GFR, Estimated: 11 mL/min — ABNORMAL LOW (ref >60)
Glucose, Bld: 85 mg/dL (ref 70–99)
Potassium: 3.8 mmol/L (ref 3.5–5.1)
Sodium: 138 mmol/L (ref 135–145)
Total Bilirubin: 1 mg/dL (ref 0.3–1.2)
Total Protein: 6.4 g/dL — ABNORMAL LOW (ref 6.5–8.1)

## 2022-05-01 LAB — CBC
HCT: 33.6 % — ABNORMAL LOW (ref 36.0–46.0)
Hemoglobin: 10.2 g/dL — ABNORMAL LOW (ref 12.0–15.0)
MCH: 29.4 pg (ref 26.0–34.0)
MCHC: 30.4 g/dL (ref 30.0–36.0)
MCV: 96.8 fL (ref 80.0–100.0)
Platelets: 172 10*3/uL (ref 150–400)
RBC: 3.47 MIL/uL — ABNORMAL LOW (ref 3.87–5.11)
RDW: 14.8 % (ref 11.5–15.5)
WBC: 3.3 10*3/uL — ABNORMAL LOW (ref 4.0–10.5)
nRBC: 0 % (ref 0.0–0.2)

## 2022-05-01 LAB — RESP PANEL BY RT-PCR (RSV, FLU A&B, COVID)  RVPGX2
Influenza A by PCR: NEGATIVE
Influenza B by PCR: NEGATIVE
Resp Syncytial Virus by PCR: NEGATIVE
SARS Coronavirus 2 by RT PCR: NEGATIVE

## 2022-05-01 LAB — LIPASE, BLOOD: Lipase: 90 U/L — ABNORMAL HIGH (ref 11–51)

## 2022-05-01 MED ORDER — ONDANSETRON HCL 4 MG/2ML IJ SOLN
4.0000 mg | Freq: Once | INTRAMUSCULAR | Status: AC
  Administered 2022-05-01: 4 mg via INTRAVENOUS
  Filled 2022-05-01: qty 2

## 2022-05-01 MED ORDER — SODIUM CHLORIDE 0.9 % IV BOLUS
500.0000 mL | Freq: Once | INTRAVENOUS | Status: AC
  Administered 2022-05-01: 500 mL via INTRAVENOUS

## 2022-05-01 MED ORDER — ONDANSETRON 4 MG PO TBDP: 4.0000 mg | ORAL_TABLET | Freq: Three times a day (TID) | ORAL | 0 refills | Status: DC | PRN

## 2022-05-01 MED ORDER — SODIUM CHLORIDE 0.9 % IV SOLN: Freq: Once | INTRAVENOUS | Status: DC

## 2022-05-01 NOTE — ED Notes (Signed)
Pt was cleaned up and placed in a clean depend.

## 2022-05-01 NOTE — ED Notes (Signed)
Pt brought to car via wheelchair

## 2022-05-01 NOTE — ED Triage Notes (Addendum)
Pt presents to ED via POV c/o diarrhea and vomiting for 5 days. Has been unable to keep anything down. Pt went to her doctor on Tuesday and was prescribed doxycyline but has not felt any better. Pt denies any abd pain with diarrhea. Pt reports she went to dialysis yesterday but they were unable to get any fluid out. Pt also endorses on/off fevers, last one was Thursday. Pt in NAD at this time. Denies CP and SOB.

## 2022-05-01 NOTE — ED Notes (Signed)
C diff order placed prior to pt d/c because she wanted to test for it. After order was placed she states "I'm unable to go right now and will just follow up with my doctor". MD aware.

## 2022-05-01 NOTE — ED Provider Notes (Signed)
[facility] Provider Note    Event Date/Time   First MD Initiated Contact with Patient 05/01/22 [phone]     (approximate)   History   Diarrhea and Emesis   HPI  Tonya Myers is a 57 y.o. female with history of end-stage renal disease, who is wheelchair-bound who presents with complaints of diarrhea nausea vomiting over the last several days.  Patient reports no significant abdominal pain, intermittent abdominal cramping.  No fevers or chills.  Appetite has developed over the last 24 hours but still not tolerating p.o.'s.  Has been taking Imodium with no improvement     Physical Exam   Triage Vital Signs: ED Triage Vitals  Enc Vitals Group     BP 05/01/22 0340 139/79     Pulse Rate 05/01/22 0340 97     Resp 05/01/22 0340 16     Temp 05/01/22 0340 99.2 F (37.3 C)     Temp Source 05/01/22 0340 Oral     SpO2 05/01/22 0340 100 %     Weight 05/01/22 0341 84.8 kg (187 lb)     Height 05/01/22 0341 1.829 m (6')     Head Circumference --      Peak Flow --      Pain Score 05/01/22 0341 0     Pain Loc --      Pain Edu? --      Excl. in [place]? --     Most recent vital signs: Vitals:   05/01/22 0830 05/01/22 1135  BP: 124/72 126/67  Pulse: 88 80  Resp: 20 18  Temp:  98.9 F (37.2 C)  SpO2: 100% 98%     General: Awake, no distress.  CV:  Good peripheral perfusion.  Resp:  Normal effort.  Abd:  No distention.  Soft, nontender Other:     ED Results / Procedures / Treatments   Labs (all labs ordered are listed, but only abnormal results are displayed) Labs Reviewed  LIPASE, BLOOD - Abnormal; Notable for the following components:      Result Value   Lipase 90 (*)    All other components within normal limits  COMPREHENSIVE METABOLIC PANEL - Abnormal; Notable for the following components:   Chloride 96 (*)    BUN 28 (*)    Creatinine, Ser 4.36 (*)    Calcium 8.8 (*)    Total Protein 6.4 (*)    GFR, Estimated 11 (*)    All other components  within normal limits  CBC - Abnormal; Notable for the following components:   WBC 3.3 (*)    RBC 3.47 (*)    Hemoglobin 10.2 (*)    HCT 33.6 (*)    All other components within normal limits  RESP PANEL BY RT-PCR (RSV, FLU A&B, COVID)  [id number]     EKG     RADIOLOGY     PROCEDURES:  Critical Care performed:   Procedures   MEDICATIONS ORDERED IN ED: Medications  ondansetron (ZOFRAN) injection 4 mg (4 mg Intravenous Given 05/01/22 0829)  sodium chloride 0.9 % bolus 500 mL (0 mLs Intravenous Stopped 05/01/22 1128)  sodium chloride 0.9 % bolus 500 mL (500 mLs Intravenous New Bag/Given 05/01/22 1132)     IMPRESSION / MDM / ASSESSMENT AND PLAN / ED COURSE  I reviewed the triage vital signs and the nursing notes. Patient's presentation is most consistent with acute presentation with potential threat to life or bodily function.  Patient with history of end-stage renal disease  presents with symptoms as above.  Highly suspicious for viral gastroenteritis which is prevalent in the community this time.  No abdominal tenderness palpation  She went to dialysis and was told that no dialysis necessary because she was dehydrated.  Lab work reviewed and demonstrates normal potassium, BUN of 28, creatinine 4.36,  Will treat with a liter of fluid, IV Zofran and reevaluate.  ----------------------------------------- 12:15 PM on 05/01/2022 ----------------------------------------- Patient significantly improved after fluids        FINAL CLINICAL IMPRESSION(S) / ED DIAGNOSES   Final diagnoses:  Dehydration  Gastroenteritis     Rx / DC Orders   ED Discharge Orders          Ordered    ondansetron (ZOFRAN-ODT) 4 MG disintegrating tablet  Every 8 hours PRN        05/01/22 1155             Note:  This document was prepared using Dragon voice recognition software and may include unintentional dictation errors.   Lavonia Drafts, MD 05/01/22 1215

## 2022-05-01 NOTE — ED Notes (Signed)
Unable to obtain urine sample.  Pt states she is dialysis pt and can only urinate a scant amount per day,

## 2022-05-03 ENCOUNTER — Emergency Department
Admission: EM | Admit: 2022-05-03 | Discharge: 2022-05-03 | Disposition: A | Discharge status: Home / Self Care | Payer: Medicare HMO | Attending: Emergency Medicine | Admitting: Emergency Medicine

## 2022-05-03 ENCOUNTER — Other Ambulatory Visit: Payer: Self-pay

## 2022-05-03 DIAGNOSIS — Z20822 Contact with and (suspected) exposure to covid-19: Secondary | ICD-10-CM | POA: Diagnosis not present

## 2022-05-03 DIAGNOSIS — A09 Infectious gastroenteritis and colitis, unspecified: Secondary | ICD-10-CM

## 2022-05-03 DIAGNOSIS — A082 Adenoviral enteritis: Secondary | ICD-10-CM

## 2022-05-03 DIAGNOSIS — R197 Diarrhea, unspecified: Secondary | ICD-10-CM | POA: Insufficient documentation

## 2022-05-03 DIAGNOSIS — Z992 Dependence on renal dialysis: Secondary | ICD-10-CM | POA: Insufficient documentation

## 2022-05-03 LAB — COMPREHENSIVE METABOLIC PANEL
ALT: 15 U/L (ref 0–44)
AST: 24 U/L (ref 15–41)
Albumin: 3.8 g/dL (ref 3.5–5.0)
Alkaline Phosphatase: 83 U/L (ref 38–126)
Anion gap: 16 — ABNORMAL HIGH (ref 5–15)
BUN: 49 mg/dL — ABNORMAL HIGH (ref 6–20)
CO2: 23 mmol/L (ref 22–32)
Calcium: 8.7 mg/dL — ABNORMAL LOW (ref 8.9–10.3)
Chloride: 104 mmol/L (ref 98–111)
Creatinine, Ser: 8.08 mg/dL — ABNORMAL HIGH (ref 0.44–1.00)
GFR, Estimated: 5 mL/min — ABNORMAL LOW (ref >60)
Glucose, Bld: 89 mg/dL (ref 70–99)
Potassium: 3.6 mmol/L (ref 3.5–5.1)
Sodium: 143 mmol/L (ref 135–145)
Total Bilirubin: 0.8 mg/dL (ref 0.3–1.2)
Total Protein: 6.5 g/dL (ref 6.5–8.1)

## 2022-05-03 LAB — CBC WITH DIFFERENTIAL/PLATELET
Abs Immature Granulocytes: 0.01 10*3/uL (ref 0.00–0.07)
Basophils Absolute: 0 10*3/uL (ref 0.0–0.1)
Basophils Relative: 1 %
Eosinophils Absolute: 0.1 10*3/uL (ref 0.0–0.5)
Eosinophils Relative: 2 %
HCT: 33.8 % — ABNORMAL LOW (ref 36.0–46.0)
Hemoglobin: 10.3 g/dL — ABNORMAL LOW (ref 12.0–15.0)
Immature Granulocytes: 0 %
Lymphocytes Relative: 29 %
Lymphs Abs: 1.3 10*3/uL (ref 0.7–4.0)
MCH: 29.9 pg (ref 26.0–34.0)
MCHC: 30.5 g/dL (ref 30.0–36.0)
MCV: 98.3 fL (ref 80.0–100.0)
Monocytes Absolute: 0.3 10*3/uL (ref 0.1–1.0)
Monocytes Relative: 7 %
Neutro Abs: 2.8 10*3/uL (ref 1.7–7.7)
Neutrophils Relative %: 61 %
Platelets: 165 10*3/uL (ref 150–400)
RBC: 3.44 MIL/uL — ABNORMAL LOW (ref 3.87–5.11)
RDW: 15 % (ref 11.5–15.5)
WBC: 4.5 10*3/uL (ref 4.0–10.5)
nRBC: 0 % (ref 0.0–0.2)

## 2022-05-03 LAB — GASTROINTESTINAL PANEL BY PCR, STOOL (REPLACES STOOL CULTURE)

## 2022-05-03 LAB — C DIFFICILE QUICK SCREEN W PCR REFLEX
C Diff antigen: NEGATIVE
C Diff interpretation: NOT DETECTED
C Diff toxin: NEGATIVE

## 2022-05-03 LAB — RESP PANEL BY RT-PCR (RSV, FLU A&B, COVID)  RVPGX2
Influenza A by PCR: NEGATIVE
Influenza B by PCR: NEGATIVE
Resp Syncytial Virus by PCR: NEGATIVE
SARS Coronavirus 2 by RT PCR: NEGATIVE

## 2022-05-03 NOTE — ED Notes (Signed)
Pt incontinent of stool. This tech and Vet, RN into clean pt. Pt changed into clean chux, gown and brief. Pt given call light to ring when needed to have another bm. No other needs voiced at this time.

## 2022-05-03 NOTE — ED Notes (Signed)
Mepilex sacral pad placed on pt. Pericare provided.

## 2022-05-03 NOTE — ED Provider Notes (Signed)
[facility] Provider Note    Event Date/Time   First MD Initiated Contact with Patient 05/03/22 0802     (approximate)   History   Diarrhea   HPI  Tonya Myers is a 57 y.o. female  who presents to the emergency department today with concern for continued diarrhea. Was seen two days ago, states that it has gotten worse. Tried to go to dialysis today but was denied given that she had soiled herself. The patient says that she is worried it might be c dif since she has had it in the past. Had recently finished a course of doxycycline for URI. Denies any abdominal pain.       Physical Exam   Triage Vital Signs: ED Triage Vitals  Enc Vitals Group     BP 05/03/22 0635 (!) 153/87     Pulse Rate 05/03/22 0635 70     Resp 05/03/22 0635 16     Temp 05/03/22 0635 98.9 F (37.2 C)     Temp Source 05/03/22 0635 Oral     SpO2 05/03/22 0635 100 %     Weight 05/03/22 0645 179 lb (81.2 kg)     Height 05/03/22 0645 6' (1.829 m)     Head Circumference --      Peak Flow --      Pain Score 05/03/22 0644 7     Pain Loc --      Pain Edu? --      Excl. in [place]? --     Most recent vital signs: Vitals:   05/03/22 0635  BP: (!) 153/87  Pulse: 70  Resp: 16  Temp: 98.9 F (37.2 C)  SpO2: 100%   General: Awake, alert, oriented. CV:  Good peripheral perfusion. Regular rate and rhythm. Resp:  Normal effort. Lungs clear. Abd:  No distention. Non tender.    ED Results / Procedures / Treatments   Labs (all labs ordered are listed, but only abnormal results are displayed) Labs Reviewed  CBC WITH DIFFERENTIAL/PLATELET - Abnormal; Notable for the following components:      Result Value   RBC 3.44 (*)    Hemoglobin 10.3 (*)    HCT 33.8 (*)    All other components within normal limits  RESP PANEL BY RT-PCR (RSV, FLU A&B, COVID)  [id number]  COMPREHENSIVE METABOLIC PANEL     EKG  I, Nance Pear, attending physician, personally viewed and interpreted this  EKG  EKG Time: 0702 Rate: 67 Rhythm: normal sinus rhythm Axis: left axis deviation Intervals: qtc 454 QRS: low voltage ST changes: no st elevation Impression: abnormal ekg   RADIOLOGY None   PROCEDURES:  Critical Care performed: No  Procedures   MEDICATIONS ORDERED IN ED: Medications - No data to display   IMPRESSION / MDM / [place] / ED COURSE  I reviewed the triage vital signs and the nursing notes.                              Differential diagnosis includes, but is not limited to, c dif, infectious diarrhea, food poisoning.  Patient's presentation is most consistent with acute presentation with potential threat to life or bodily function.    Patient presented to the emergency department today because of concerns for continued diarrhea.  Additionally patient missed dialysis today.  Patient potassium is within normal limits.  Would have concern for c dif given history of c dif  and recent antibiotic use. Awaiting stool studies at time of signout.     FINAL CLINICAL IMPRESSION(S) / ED DIAGNOSES   Diarrhea   Note:  This document was prepared using Dragon voice recognition software and may include unintentional dictation errors.    Nance Pear, MD 05/03/22 [phone]

## 2022-05-03 NOTE — Discharge Instructions (Addendum)
You may use loperamide (Imodium/Imodium A-D) 2 mg after every episode of diarrhea up to 8 mg in a 24-hour period

## 2022-05-03 NOTE — ED Notes (Signed)
Pt given apple juice and water. Does not have to have a bowel movement right now. She has a call light if she has to go.

## 2022-05-03 NOTE — ED Triage Notes (Addendum)
Pt arrives via POV with CC of diarrhea that has been ongoing since 04/27/22 (7 days). Pt reports 7 episodes of diarrhea in the last 24 hours. Pt reports nausea without vomiting. Pt is alert, oriented and appears to be in no acute distress at this time. Pt reports being turned away from dialysis by Dr. Chipper Oman this morning due to having stool on clothing. Pt is adamant that she would like to receive dialysis today - if not she will have gone 5 days without dialysis.

## 2022-05-03 NOTE — ED Provider Notes (Signed)
Emergency department handoff note  Care of this patient was signed out to me at the end of the previous provider shift.  All pertinent patient information was conveyed and all questions were answered.  Patient pending stool studies including C. difficile and viral gastro PCR.  PCR does show adenovirus positivity and C. difficile negative. The patient has been reexamined and is ready to be discharged.  All diagnostic results have been reviewed and discussed with the patient/family.  Care plan has been outlined and the patient/family understands all current diagnoses, results, and treatment plans.  There are no new complaints, changes, or physical findings at this time.  All questions have been addressed and answered.  Patient was instructed to, and agrees to follow-up with their primary care physician as well as return to the emergency department if any new or worsening symptoms develop.   Naaman Plummer, MD 05/03/22 1630

## 2022-06-29 ENCOUNTER — Encounter: Payer: Self-pay | Admitting: Oncology

## 2022-07-13 ENCOUNTER — Other Ambulatory Visit: Payer: Self-pay | Admitting: Family Medicine

## 2022-07-13 ENCOUNTER — Ambulatory Visit: Payer: Medicare HMO | Admitting: Podiatry

## 2022-07-13 DIAGNOSIS — Z1231 Encounter for screening mammogram for malignant neoplasm of breast: Secondary | ICD-10-CM

## 2022-07-29 ENCOUNTER — Ambulatory Visit
Admission: RE | Admit: 2022-07-29 | Discharge: 2022-07-29 | Disposition: A | Discharge status: Home / Self Care | Payer: Medicare HMO | Source: Ambulatory Visit | Attending: Family Medicine | Admitting: Family Medicine

## 2022-07-29 DIAGNOSIS — Z1231 Encounter for screening mammogram for malignant neoplasm of breast: Secondary | ICD-10-CM | POA: Diagnosis present

## 2022-08-03 ENCOUNTER — Ambulatory Visit (INDEPENDENT_AMBULATORY_CARE_PROVIDER_SITE_OTHER): Payer: Medicare HMO | Admitting: Podiatry

## 2022-08-03 ENCOUNTER — Encounter: Payer: Self-pay | Admitting: Podiatry

## 2022-08-03 VITALS — BP 145/88 | HR 58

## 2022-08-03 DIAGNOSIS — B351 Tinea unguium: Secondary | ICD-10-CM | POA: Diagnosis not present

## 2022-08-03 DIAGNOSIS — M79674 Pain in right toe(s): Secondary | ICD-10-CM | POA: Diagnosis not present

## 2022-08-03 DIAGNOSIS — M79675 Pain in left toe(s): Secondary | ICD-10-CM | POA: Diagnosis not present

## 2022-08-03 DIAGNOSIS — E119 Type 2 diabetes mellitus without complications: Secondary | ICD-10-CM

## 2022-08-03 NOTE — Progress Notes (Signed)
   Chief Complaint  Patient presents with   Toe Pain    "I have bent toes.  They hurt but they are numb. My toenails have turned black." N - toes hurt L - 1-5 bilateral D - 6-7 mos O - gradually worse C - burn, sharp, spasms of pain, Diabetic A - shoes, nights are worse T - ointment for the nails, Voltaren Gel. Pregabalin    SUBJECTIVE Patient with a history of diabetes mellitus presents to office today complaining of elongated, thickened nails that cause pain while ambulating in shoes.  Patient is unable to trim their own nails. Patient is here for further evaluation and treatment.  Past Medical History:  Diagnosis Date   Acute hemorrhoid 03/11/2015   Anxiety    Arthritis    Asthma    Cancer    Chronic back pain    CLL (chronic lymphocytic leukemia)    Depression    Diabetes mellitus without complication    Genital herpes    type 2   Hypertension    Hypothyroidism    Vertigo     Allergies  Allergen Reactions   Amoxapine    Penicillins    Ceftazidime     Other reaction(s): Confusion, Hallucination, Unknown Encephalopathy - improved after dialysis/some concern for cephalosporin neurotoxicity Tolerated without confusion 06/2021. Encephalopathy - improved after dialysis/some concern for cephalosporin neurotoxicity Tolerated without confusion 06/2021.      OBJECTIVE General Patient is awake, alert, and oriented x 3 and in no acute distress. Derm Skin is dry and supple bilateral. Negative open lesions or macerations. Remaining integument unremarkable. Nails are tender, long, thickened and dystrophic with subungual debris, consistent with onychomycosis, 1-5 bilateral. No signs of infection noted. Vasc  DP and PT pedal pulses palpable bilaterally. Temperature gradient within normal limits.  Neuro Epicritic and protective threshold sensation diminished bilaterally.  Musculoskeletal Exam No symptomatic pedal deformities noted bilateral. Muscular strength within normal  limits.  ASSESSMENT 1. Diabetes Mellitus w/ peripheral neuropathy 2.  Pain due to onychomycosis of toenails bilateral  PLAN OF CARE 1. Patient evaluated today.  Comprehensive diabetic foot exam performed today 2. Instructed to maintain good pedal hygiene and foot care. Stressed importance of controlling blood sugar.  3. Mechanical debridement of nails 1-5 bilaterally performed using a nail nipper. Filed with dremel without incident.  4. Return to clinic in 3 mos.     Felecia Shelling, DPM [facility]  Dr. Felecia Shelling, DPM    [address] [place], [place] [address]                Office [phone]  Fax [phone]

## 2022-10-05 ENCOUNTER — Ambulatory Visit (INDEPENDENT_AMBULATORY_CARE_PROVIDER_SITE_OTHER): Payer: Medicare HMO | Admitting: Podiatry

## 2022-10-05 DIAGNOSIS — E0843 Diabetes mellitus due to underlying condition with diabetic autonomic (poly)neuropathy: Secondary | ICD-10-CM | POA: Diagnosis not present

## 2022-10-05 DIAGNOSIS — M79673 Pain in unspecified foot: Secondary | ICD-10-CM | POA: Diagnosis not present

## 2022-10-05 MED ORDER — GABAPENTIN 300 MG PO CAPS: 300.0000 mg | ORAL_CAPSULE | Freq: Three times a day (TID) | ORAL | 3 refills | Status: DC

## 2022-10-05 NOTE — Progress Notes (Signed)
Chief Complaint  Patient presents with   Peripheral Neuropathy    Bilateral neuropathy, burning numbness tingling,      HPI: 57 y.o. female Hx T2DM presenting today for evaluation of her peripheral polyneuropathy to the bilateral feet.  This is been ongoing for few years now.  Patient is interested in Qutenza for the neuropathy.  Presenting for further treatment evaluation  Past Medical History:  Diagnosis Date   Acute hemorrhoid 03/11/2015   Anxiety    Arthritis    Asthma    Cancer (HCC)    Chronic back pain    CLL (chronic lymphocytic leukemia) (HCC)    Depression    Diabetes mellitus without complication (HCC)    Genital herpes    type 2   Hypertension    Hypothyroidism    Vertigo     Past Surgical History:  Procedure Laterality Date   ABDOMINAL HYSTERECTOMY     BILATERAL SALPINGOOPHORECTOMY  2009   BREAST BIOPSY Right 05/17/2016   FIBROADENOMATOUS CHANGE AND SCLEROSING ADENOSIS WITH   COLONOSCOPY WITH PROPOFOL N/A 06/16/2015   Procedure: COLONOSCOPY WITH PROPOFOL;  Surgeon: Elnita Maxwell, MD;  Location: [facility] ENDOSCOPY;  Service: Endoscopy;  Laterality: N/A;   DIALYSIS/PERMA CATHETER INSERTION N/A 12/17/2020   Procedure: DIALYSIS/PERMA CATHETER INSERTION;  Surgeon: Annice Needy, MD;  Location: [facility] INVASIVE CV LAB;  Service: Cardiovascular;  Laterality: N/A;   LAPAROSCOPIC SUPRACERVICAL HYSTERECTOMY  2009   due to leio   OOPHORECTOMY     TEMPORARY DIALYSIS CATHETER N/A 12/10/2020   Procedure: TEMPORARY DIALYSIS CATHETER;  Surgeon: Renford Dills, MD;  Location: [facility] INVASIVE CV LAB;  Service: Cardiovascular;  Laterality: N/A;   THORACIC LAMINECTOMY FOR EPIDURAL ABSCESS Bilateral 06/17/2020   Procedure: THORACIC LAMINECTOMY FOR EPIDURAL ABSCESS;  Surgeon: Lucy Chris, MD;  Location: [facility] ORS;  Service: Neurosurgery;  Laterality: Bilateral;    Allergies  Allergen Reactions   Amoxapine    Penicillins    Ceftazidime     Other reaction(s): Confusion,  Hallucination, Unknown Encephalopathy - improved after dialysis/some concern for cephalosporin neurotoxicity Tolerated without confusion 06/2021. Encephalopathy - improved after dialysis/some concern for cephalosporin neurotoxicity Tolerated without confusion 06/2021.      Physical Exam: General: The patient is alert and oriented x3 in no acute distress.  Dermatology: Skin is warm, dry and supple bilateral lower extremities.   Vascular: Palpable pedal pulses bilaterally. Capillary refill within normal limits.  No appreciable edema.  No erythema.  Neurological: Light touch and protective threshold diminished.  Paresthesia with pins-and-needles noted bilateral toes and feet  Musculoskeletal Exam: No pedal deformities noted  Assessment/Plan of Care: 1.  Peripheral polyneuropathy bilateral feet secondary to diabetes  -Patient evaluated. -Currently the patient takes gabapentin 400 mg nightly without relief.  Patient states that she had an old bottle from a few years prior. -New prescription for gabapentin 300 mg 3 times daily -Patient is also interested in Qutenza application for the symptomatic neuropathy.  We will request preauthorization to see if the patient is approved through her insurance -Continue wearing wide fitting shoes that do not constrict the toebox area -Return to clinic if she is approved for Qutenza for application     Felecia Shelling, DPM [facility]  Dr. Felecia Shelling, DPM    [address]  [place], [place] [address]                Office [phone]  Fax [phone]

## 2022-10-12 ENCOUNTER — Telehealth: Payer: Self-pay | Admitting: Podiatry

## 2022-10-12 ENCOUNTER — Encounter: Payer: Self-pay | Admitting: Podiatry

## 2022-10-12 NOTE — Telephone Encounter (Signed)
Pt stated that she received a call & was told that her insurance does not cover the Rx that was called in. She wanted to know about other medications that might be approved; I advised pt to contact insurance

## 2023-02-11 ENCOUNTER — Inpatient Hospital Stay: Payer: Medicare HMO | Attending: Oncology | Admitting: Oncology

## 2023-02-11 ENCOUNTER — Encounter: Payer: Self-pay | Admitting: Oncology

## 2023-02-11 ENCOUNTER — Inpatient Hospital Stay: Payer: Medicare HMO

## 2023-02-11 VITALS — BP 112/74 | HR 82 | Temp 98.1°F | Resp 18 | Ht 72.0 in | Wt 192.0 lb

## 2023-02-11 DIAGNOSIS — Z79624 Long term (current) use of inhibitors of nucleotide synthesis: Secondary | ICD-10-CM | POA: Diagnosis not present

## 2023-02-11 DIAGNOSIS — E1122 Type 2 diabetes mellitus with diabetic chronic kidney disease: Secondary | ICD-10-CM | POA: Insufficient documentation

## 2023-02-11 DIAGNOSIS — I120 Hypertensive chronic kidney disease with stage 5 chronic kidney disease or end stage renal disease: Secondary | ICD-10-CM | POA: Diagnosis not present

## 2023-02-11 DIAGNOSIS — Z809 Family history of malignant neoplasm, unspecified: Secondary | ICD-10-CM | POA: Insufficient documentation

## 2023-02-11 DIAGNOSIS — D696 Thrombocytopenia, unspecified: Secondary | ICD-10-CM | POA: Insufficient documentation

## 2023-02-11 DIAGNOSIS — J45909 Unspecified asthma, uncomplicated: Secondary | ICD-10-CM | POA: Diagnosis not present

## 2023-02-11 DIAGNOSIS — Z7982 Long term (current) use of aspirin: Secondary | ICD-10-CM | POA: Insufficient documentation

## 2023-02-11 DIAGNOSIS — N186 End stage renal disease: Secondary | ICD-10-CM | POA: Insufficient documentation

## 2023-02-11 DIAGNOSIS — Z79890 Hormone replacement therapy (postmenopausal): Secondary | ICD-10-CM | POA: Insufficient documentation

## 2023-02-11 DIAGNOSIS — D649 Anemia, unspecified: Secondary | ICD-10-CM | POA: Insufficient documentation

## 2023-02-11 DIAGNOSIS — C9110 Chronic lymphocytic leukemia of B-cell type not having achieved remission: Secondary | ICD-10-CM

## 2023-02-11 DIAGNOSIS — E039 Hypothyroidism, unspecified: Secondary | ICD-10-CM | POA: Insufficient documentation

## 2023-02-11 DIAGNOSIS — Z803 Family history of malignant neoplasm of breast: Secondary | ICD-10-CM | POA: Insufficient documentation

## 2023-02-11 DIAGNOSIS — R5383 Other fatigue: Secondary | ICD-10-CM | POA: Insufficient documentation

## 2023-02-11 DIAGNOSIS — Z79899 Other long term (current) drug therapy: Secondary | ICD-10-CM | POA: Diagnosis not present

## 2023-02-11 LAB — CBC WITH DIFFERENTIAL/PLATELET
Abs Immature Granulocytes: 0.02 10*3/uL (ref 0.00–0.07)
Basophils Absolute: 0 10*3/uL (ref 0.0–0.1)
Basophils Relative: 1 %
Eosinophils Absolute: 0.1 10*3/uL (ref 0.0–0.5)
Eosinophils Relative: 3 %
HCT: 33.2 % — ABNORMAL LOW (ref 36.0–46.0)
Hemoglobin: 10.4 g/dL — ABNORMAL LOW (ref 12.0–15.0)
Immature Granulocytes: 1 %
Lymphocytes Relative: 29 %
Lymphs Abs: 0.8 10*3/uL (ref 0.7–4.0)
MCH: 28.4 pg (ref 26.0–34.0)
MCHC: 31.3 g/dL (ref 30.0–36.0)
MCV: 90.7 fL (ref 80.0–100.0)
Monocytes Absolute: 0.3 10*3/uL (ref 0.1–1.0)
Monocytes Relative: 9 %
Neutro Abs: 1.7 10*3/uL (ref 1.7–7.7)
Neutrophils Relative %: 57 %
Platelets: 131 10*3/uL — ABNORMAL LOW (ref 150–400)
RBC: 3.66 MIL/uL — ABNORMAL LOW (ref 3.87–5.11)
RDW: 17.2 % — ABNORMAL HIGH (ref 11.5–15.5)
Smear Review: NORMAL
WBC: 2.9 10*3/uL — ABNORMAL LOW (ref 4.0–10.5)
nRBC: 0 % (ref 0.0–0.2)

## 2023-02-11 NOTE — Progress Notes (Signed)
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  She is now being evaluated by renal transplant team and is referred for further evaluation.  She has chronic weakness and fatigue, but otherwise feels well.  She has no neurologic complaints.  She denies any recent fevers or illnesses.  She has a fair appetite, but denies weight loss.  She does not complain of any night sweats or other B symptoms.  She has no chest pain, shortness of breath, cough, or hemoptysis.  She denies any nausea, vomiting, constipation, or diarrhea.  She has no urinary complaints.  Patient offers no further specific complaints today.  REVIEW OF SYSTEMS:   Review of Systems  Constitutional:  Positive for malaise/fatigue. Negative for fever and weight loss.  Respiratory: Negative.  Negative for cough, hemoptysis and shortness of breath.   Cardiovascular: Negative.  Negative for chest pain and leg swelling.  Gastrointestinal: Negative.  Negative for abdominal pain.  Genitourinary: Negative.  Negative for dysuria.  Musculoskeletal: Negative.  Negative for back pain.  Skin: Negative.  Negative for rash.  Neurological:  Positive for weakness. Negative for dizziness, focal weakness and headaches.  Psychiatric/Behavioral: Negative.  The patient is not nervous/anxious.     As per HPI. Otherwise, a complete review of systems is negative.  PAST MEDICAL HISTORY: Past Medical History:  Diagnosis Date   Acute hemorrhoid 03/11/2015   Anxiety    Arthritis    Asthma    Cancer (HCC)    Chronic back pain    CLL (chronic lymphocytic leukemia) (HCC)    Depression    Diabetes mellitus without  complication (HCC)    Genital herpes    type 2   Hypertension    Hypothyroidism    Vertigo     PAST SURGICAL HISTORY: Past Surgical History:  Procedure Laterality Date   ABDOMINAL HYSTERECTOMY     BILATERAL SALPINGOOPHORECTOMY  2009   BREAST BIOPSY Right 05/17/2016   FIBROADENOMATOUS CHANGE AND SCLEROSING ADENOSIS WITH   COLONOSCOPY WITH PROPOFOL N/A 06/16/2015   Procedure: COLONOSCOPY WITH PROPOFOL;  Surgeon: Elnita Maxwell, MD;  Location: [facility] ENDOSCOPY;  Service: Endoscopy;  Laterality: N/A;   DIALYSIS/PERMA CATHETER INSERTION N/A 12/17/2020   Procedure: DIALYSIS/PERMA CATHETER INSERTION;  Surgeon: Annice Needy, MD;  Location: [facility] INVASIVE CV LAB;  Service: Cardiovascular;  Laterality: N/A;   LAPAROSCOPIC SUPRACERVICAL HYSTERECTOMY  2009   due to leio   OOPHORECTOMY     TEMPORARY DIALYSIS CATHETER N/A 12/10/2020   Procedure: TEMPORARY DIALYSIS CATHETER;  Surgeon: Renford Dills, MD;  Location: [facility] INVASIVE CV LAB;  Service: Cardiovascular;  Laterality: N/A;   THORACIC LAMINECTOMY FOR EPIDURAL ABSCESS Bilateral 06/17/2020   Procedure: THORACIC LAMINECTOMY FOR EPIDURAL ABSCESS;  Surgeon: Lucy Chris, MD;  Location: [facility] ORS;  Service: Neurosurgery;  Laterality: Bilateral;    FAMILY HISTORY: Family History  Problem Relation Age of Onset   Cancer Paternal Aunt    Breast cancer Maternal Aunt 70    ADVANCED DIRECTIVES (Y/N):  N  HEALTH MAINTENANCE: Social History   Tobacco Use   Smoking status: Never   Smokeless tobacco: Never  Vaping Use   Vaping status: Never Used  Substance Use Topics   Alcohol use: Not Currently  Alcohol/week: 1.0 standard drink of alcohol    Types: 1 Cans of beer per week   Drug use: No     Colonoscopy:  PAP:  Bone density:  Lipid panel:  Allergies  Allergen Reactions   Amoxapine    Penicillins    Ceftazidime     Other reaction(s): Confusion, Hallucination, Unknown Encephalopathy - improved after dialysis/some concern for  cephalosporin neurotoxicity Tolerated without confusion 06/2021. Encephalopathy - improved after dialysis/some concern for cephalosporin neurotoxicity Tolerated without confusion 06/2021.     Current Outpatient Medications  Medication Sig Dispense Refill   acetaminophen (TYLENOL) 325 MG tablet Take 2 tablets (650 mg total) by mouth every 6 (six) hours as needed for mild pain (or Fever >/= 101).     albuterol (VENTOLIN HFA) 108 (90 Base) MCG/ACT inhaler Inhale 2 puffs into the lungs every 6 (six) hours as needed for wheezing or shortness of breath.     amitriptyline (ELAVIL) 10 MG tablet Take 10 mg by mouth at bedtime.     ascorbic acid (VITAMIN C) 500 MG tablet Take 1 tablet (500 mg total) by mouth 2 (two) times daily.     aspirin EC 81 MG tablet Take 1 tablet (81 mg total) by mouth daily. Swallow whole. 30 tablet 11   atorvastatin (LIPITOR) 20 MG tablet Take 20 mg by mouth daily.     cetirizine (ZYRTEC) 10 MG tablet Take 10 mg by mouth daily.     Continuous Blood Gluc Sensor (DEXCOM G6 SENSOR) MISC SMARTSIG:1 Each Topical Every 10 Days     fluticasone (FLONASE) 50 MCG/ACT nasal spray 1 spray 2 (two) times daily.     folic acid (FOLVITE) 1 MG tablet Take 1 tablet by mouth daily.     gabapentin (NEURONTIN) 300 MG capsule Take 1 capsule (300 mg total) by mouth 3 (three) times daily. 90 capsule 3   levothyroxine (SYNTHROID) 125 MCG tablet Take 125 mcg by mouth daily.     lidocaine (LIDODERM) 5 % Place 2 patches onto the skin daily. Remove & Discard patch within 12 hours or as directed by MD 30 patch 0   multivitamin (RENA-VIT) TABS tablet Take 1 tablet by mouth at bedtime.  0   ondansetron (ZOFRAN-ODT) 4 MG disintegrating tablet Take 1 tablet (4 mg total) by mouth every 8 (eight) hours as needed for nausea or vomiting. 20 tablet 0   senna-docusate (SENOKOT-S) 8.6-50 MG tablet Take 2 tablets by mouth at bedtime.     sevelamer carbonate (RENVELA) 800 MG tablet Take 800 mg by mouth 3 (three) times  daily.     valACYclovir (VALTREX) 500 MG tablet Take 500 mg by mouth daily.     zinc sulfate 220 (50 Zn) MG capsule Take 1 capsule (220 mg total) by mouth daily.     No current facility-administered medications for this visit.    OBJECTIVE: Vitals:   02/11/23 1046  BP: 112/74  Pulse: 82  Resp: 18  Temp: 98.1 F (36.7 C)  SpO2: 100%     Body mass index is 26.04 kg/m.    ECOG FS:0 - Asymptomatic  General: Well-developed, well-nourished, no acute distress. Eyes: Pink conjunctiva, anicteric sclera. HEENT: Normocephalic, moist mucous membranes. Lungs: No audible wheezing or coughing. Heart: Regular rate and rhythm. Abdomen: Soft, nontender, no obvious distention. Musculoskeletal: No edema, cyanosis, or clubbing. Neuro: Alert, answering all questions appropriately. Cranial nerves grossly intact. Skin: No rashes or petechiae noted. Psych: Normal affect. Lymphatics: No cervical, calvicular, axillary or inguinal LAD.  LAB RESULTS:  Lab Results  Component Value Date   NA 143 05/03/2022   K 3.6 05/03/2022   CL 104 05/03/2022   CO2 23 05/03/2022   GLUCOSE 89 05/03/2022   BUN 49 (H) 05/03/2022   CREATININE 8.08 (H) 05/03/2022   CALCIUM 8.7 (L) 05/03/2022   PROT 6.5 05/03/2022   ALBUMIN 3.8 05/03/2022   AST 24 05/03/2022   ALT 15 05/03/2022   ALKPHOS 83 05/03/2022   BILITOT 0.8 05/03/2022   GFRNONAA 5 (L) 05/03/2022   GFRAA >60 01/17/2020    Lab Results  Component Value Date   WBC 2.9 (L) 02/11/2023   NEUTROABS 1.7 02/11/2023   HGB 10.4 (L) 02/11/2023   HCT 33.2 (L) 02/11/2023   MCV 90.7 02/11/2023   PLT 131 (L) 02/11/2023     STUDIES: No results found.  ASSESSMENT: CLL  PLAN:    CLL:  Patient appears to be in complete remission with no evidence of disease.  Her total white blood cell count is now slightly decreased and she does not have a lymphocytic predominance.  Peripheral blood flow cytometry is pending at time of dictation.  CT scan from December 12, 2021 did not reveal any pathologically enlarged lymphadenopathy.  She last received treatment with Rituxan and Treanda on April 09, 2015.  No further intervention is needed.  Patient does not require bone marrow biopsy. Anemia: Chronic and unchanged.  Patient's hemoglobin is 10.4 today.  This has improved significantly over the past 1 to 2 years.  Possibly related to her underlying renal failure. Thrombocytopenia: Mild, monitor. End-stage renal disease: Patient reports she receives dialysis on Monday, Wednesdays, and Fridays.  Patient should be eligible for kidney transplant from a hematology standpoint. Disposition: No follow-up has been scheduled at this time.  Please refer patient back if there are any questions or concerns.  I spent a total of 30 minutes reviewing chart data, face-to-face evaluation with the patient, counseling and coordination of care as detailed above.  Patient expressed understanding and was in agreement with this plan. She also understands that She can call clinic at any time with any questions, concerns, or complaints.    Jeralyn Ruths, MD   02/11/2023 12:32 PM

## 2023-02-16 LAB — COMP PANEL: LEUKEMIA/LYMPHOMA

## 2023-03-28 ENCOUNTER — Inpatient Hospital Stay: Payer: Medicare HMO

## 2023-03-28 ENCOUNTER — Emergency Department: Payer: Medicare HMO

## 2023-03-28 ENCOUNTER — Inpatient Hospital Stay
Admission: EM | Admit: 2023-03-28 | Discharge: 2023-03-30 | DRG: 640 | Disposition: A | Discharge status: Home / Self Care | Payer: Medicare HMO | Attending: Internal Medicine | Admitting: Internal Medicine

## 2023-03-28 ENCOUNTER — Inpatient Hospital Stay (HOSPITAL_COMMUNITY)
Admit: 2023-03-28 | Discharge: 2023-03-28 | Disposition: A | Discharge status: Home / Self Care | Payer: Medicare HMO | Attending: Internal Medicine | Admitting: Internal Medicine

## 2023-03-28 ENCOUNTER — Other Ambulatory Visit: Payer: Self-pay

## 2023-03-28 DIAGNOSIS — I2510 Atherosclerotic heart disease of native coronary artery without angina pectoris: Secondary | ICD-10-CM | POA: Diagnosis present

## 2023-03-28 DIAGNOSIS — R7989 Other specified abnormal findings of blood chemistry: Secondary | ICD-10-CM | POA: Diagnosis present

## 2023-03-28 DIAGNOSIS — I6389 Other cerebral infarction: Secondary | ICD-10-CM

## 2023-03-28 DIAGNOSIS — Z79899 Other long term (current) drug therapy: Secondary | ICD-10-CM

## 2023-03-28 DIAGNOSIS — Z79890 Hormone replacement therapy (postmenopausal): Secondary | ICD-10-CM

## 2023-03-28 DIAGNOSIS — D631 Anemia in chronic kidney disease: Secondary | ICD-10-CM | POA: Diagnosis present

## 2023-03-28 DIAGNOSIS — Z992 Dependence on renal dialysis: Secondary | ICD-10-CM

## 2023-03-28 DIAGNOSIS — M5417 Radiculopathy, lumbosacral region: Secondary | ICD-10-CM | POA: Diagnosis present

## 2023-03-28 DIAGNOSIS — E785 Hyperlipidemia, unspecified: Secondary | ICD-10-CM | POA: Diagnosis present

## 2023-03-28 DIAGNOSIS — J4489 Other specified chronic obstructive pulmonary disease: Secondary | ICD-10-CM | POA: Diagnosis present

## 2023-03-28 DIAGNOSIS — G8929 Other chronic pain: Secondary | ICD-10-CM | POA: Diagnosis present

## 2023-03-28 DIAGNOSIS — E1122 Type 2 diabetes mellitus with diabetic chronic kidney disease: Secondary | ICD-10-CM | POA: Diagnosis present

## 2023-03-28 DIAGNOSIS — I120 Hypertensive chronic kidney disease with stage 5 chronic kidney disease or end stage renal disease: Principal | ICD-10-CM | POA: Diagnosis present

## 2023-03-28 DIAGNOSIS — E039 Hypothyroidism, unspecified: Secondary | ICD-10-CM | POA: Diagnosis present

## 2023-03-28 DIAGNOSIS — N186 End stage renal disease: Secondary | ICD-10-CM | POA: Diagnosis present

## 2023-03-28 DIAGNOSIS — R471 Dysarthria and anarthria: Secondary | ICD-10-CM | POA: Diagnosis present

## 2023-03-28 DIAGNOSIS — Z888 Allergy status to other drugs, medicaments and biological substances status: Secondary | ICD-10-CM

## 2023-03-28 DIAGNOSIS — W19XXXA Unspecified fall, initial encounter: Secondary | ICD-10-CM | POA: Diagnosis present

## 2023-03-28 DIAGNOSIS — M5412 Radiculopathy, cervical region: Secondary | ICD-10-CM | POA: Diagnosis present

## 2023-03-28 DIAGNOSIS — R531 Weakness: Principal | ICD-10-CM

## 2023-03-28 DIAGNOSIS — Z880 Allergy status to penicillin: Secondary | ICD-10-CM

## 2023-03-28 DIAGNOSIS — R799 Abnormal finding of blood chemistry, unspecified: Secondary | ICD-10-CM

## 2023-03-28 DIAGNOSIS — E875 Hyperkalemia: Principal | ICD-10-CM | POA: Diagnosis present

## 2023-03-28 DIAGNOSIS — Z7982 Long term (current) use of aspirin: Secondary | ICD-10-CM

## 2023-03-28 DIAGNOSIS — Z803 Family history of malignant neoplasm of breast: Secondary | ICD-10-CM

## 2023-03-28 DIAGNOSIS — L89152 Pressure ulcer of sacral region, stage 2: Secondary | ICD-10-CM | POA: Diagnosis present

## 2023-03-28 DIAGNOSIS — Z7951 Long term (current) use of inhaled steroids: Secondary | ICD-10-CM

## 2023-03-28 DIAGNOSIS — R2990 Unspecified symptoms and signs involving the nervous system: Secondary | ICD-10-CM | POA: Diagnosis not present

## 2023-03-28 DIAGNOSIS — N19 Unspecified kidney failure: Secondary | ICD-10-CM | POA: Diagnosis not present

## 2023-03-28 DIAGNOSIS — E66811 Obesity, class 1: Secondary | ICD-10-CM | POA: Diagnosis present

## 2023-03-28 DIAGNOSIS — E8721 Acute metabolic acidosis: Secondary | ICD-10-CM | POA: Diagnosis present

## 2023-03-28 DIAGNOSIS — F32A Depression, unspecified: Secondary | ICD-10-CM | POA: Diagnosis present

## 2023-03-28 DIAGNOSIS — R29711 NIHSS score 11: Secondary | ICD-10-CM | POA: Diagnosis present

## 2023-03-28 DIAGNOSIS — R944 Abnormal results of kidney function studies: Secondary | ICD-10-CM | POA: Diagnosis present

## 2023-03-28 DIAGNOSIS — F418 Other specified anxiety disorders: Secondary | ICD-10-CM | POA: Diagnosis present

## 2023-03-28 DIAGNOSIS — Z90711 Acquired absence of uterus with remaining cervical stump: Secondary | ICD-10-CM

## 2023-03-28 DIAGNOSIS — I639 Cerebral infarction, unspecified: Secondary | ICD-10-CM | POA: Diagnosis present

## 2023-03-28 DIAGNOSIS — Z6832 Body mass index (BMI) 32.0-32.9, adult: Secondary | ICD-10-CM

## 2023-03-28 DIAGNOSIS — M5416 Radiculopathy, lumbar region: Secondary | ICD-10-CM | POA: Diagnosis present

## 2023-03-28 DIAGNOSIS — Z856 Personal history of leukemia: Secondary | ICD-10-CM

## 2023-03-28 DIAGNOSIS — E6609 Other obesity due to excess calories: Secondary | ICD-10-CM | POA: Diagnosis not present

## 2023-03-28 LAB — BASIC METABOLIC PANEL
Anion gap: 19 — ABNORMAL HIGH (ref 5–15)
BUN: 121 mg/dL — ABNORMAL HIGH (ref 6–20)
CO2: 20 mmol/L — ABNORMAL LOW (ref 22–32)
Calcium: 7.2 mg/dL — ABNORMAL LOW (ref 8.9–10.3)
Chloride: 103 mmol/L (ref 98–111)
Creatinine, Ser: 12.5 mg/dL — ABNORMAL HIGH (ref 0.44–1.00)
GFR, Estimated: 3 mL/min — ABNORMAL LOW (ref >60)
Glucose, Bld: 159 mg/dL — ABNORMAL HIGH (ref 70–99)
Potassium: 6.1 mmol/L — ABNORMAL HIGH (ref 3.5–5.1)
Sodium: 142 mmol/L (ref 135–145)

## 2023-03-28 LAB — CBC
HCT: 30.4 % — ABNORMAL LOW (ref 36.0–46.0)
Hemoglobin: 9.6 g/dL — ABNORMAL LOW (ref 12.0–15.0)
MCH: 28.9 pg (ref 26.0–34.0)
MCHC: 31.6 g/dL (ref 30.0–36.0)
MCV: 91.6 fL (ref 80.0–100.0)
Platelets: 134 10*3/uL — ABNORMAL LOW (ref 150–400)
RBC: 3.32 MIL/uL — ABNORMAL LOW (ref 3.87–5.11)
RDW: 17 % — ABNORMAL HIGH (ref 11.5–15.5)
WBC: 4.2 10*3/uL (ref 4.0–10.5)
nRBC: 0 % (ref 0.0–0.2)

## 2023-03-28 LAB — ECHOCARDIOGRAM COMPLETE
AR max vel: 2.68 cm2
AV Area VTI: 3.18 cm2
AV Area mean vel: 2.68 cm2
AV Mean grad: 3 mm[Hg]
AV Peak grad: 6.3 mm[Hg]
Ao pk vel: 1.25 m/s
Area-P 1/2: 3.83 cm2
Height: 66 in
S' Lateral: 3.6 cm
Weight: 2800 [oz_av]

## 2023-03-28 LAB — URINALYSIS, ROUTINE W REFLEX MICROSCOPIC
Bilirubin Urine: NEGATIVE
Glucose, UA: 50 mg/dL — AB
Hgb urine dipstick: NEGATIVE
Ketones, ur: NEGATIVE mg/dL
Nitrite: NEGATIVE
Protein, ur: 30 mg/dL — AB
Specific Gravity, Urine: 1.009 (ref 1.005–1.030)
pH: 9 — ABNORMAL HIGH (ref 5.0–8.0)

## 2023-03-28 LAB — GLUCOSE, CAPILLARY: Glucose-Capillary: 155 mg/dL — ABNORMAL HIGH (ref 70–99)

## 2023-03-28 MED ORDER — LORAZEPAM 2 MG/ML IJ SOLN: 0.5000 mg | Freq: Four times a day (QID) | INTRAMUSCULAR | Status: DC | PRN

## 2023-03-28 MED ORDER — ASPIRIN 81 MG PO TBEC
81.0000 mg | DELAYED_RELEASE_TABLET | Freq: Every day | ORAL | Status: DC
  Administered 2023-03-29 – 2023-03-30 (×2): 81 mg via ORAL
  Filled 2023-03-28 (×3): qty 1

## 2023-03-28 MED ORDER — ALBUTEROL SULFATE (2.5 MG/3ML) 0.083% IN NEBU: 2.5000 mg | INHALATION_SOLUTION | Freq: Four times a day (QID) | RESPIRATORY_TRACT | Status: DC | PRN

## 2023-03-28 MED ORDER — ATORVASTATIN CALCIUM 20 MG PO TABS
20.0000 mg | ORAL_TABLET | Freq: Every day | ORAL | Status: DC
  Filled 2023-03-28: qty 1

## 2023-03-28 MED ORDER — HEPARIN SODIUM (PORCINE) 1000 UNIT/ML DIALYSIS: 1000.0000 [IU] | INTRAMUSCULAR | Status: DC | PRN

## 2023-03-28 MED ORDER — AMITRIPTYLINE HCL 10 MG PO TABS
10.0000 mg | ORAL_TABLET | Freq: Every day | ORAL | Status: DC
  Administered 2023-03-28 – 2023-03-29 (×2): 10 mg via ORAL
  Filled 2023-03-28 (×4): qty 1

## 2023-03-28 MED ORDER — LIDOCAINE-PRILOCAINE 2.5-2.5 % EX CREA: 1.0000 | TOPICAL_CREAM | CUTANEOUS | Status: DC | PRN

## 2023-03-28 MED ORDER — LORATADINE 10 MG PO TABS
10.0000 mg | ORAL_TABLET | Freq: Every day | ORAL | Status: DC
  Administered 2023-03-29 – 2023-03-30 (×2): 10 mg via ORAL
  Filled 2023-03-28 (×2): qty 1

## 2023-03-28 MED ORDER — ONDANSETRON 4 MG PO TBDP: 4.0000 mg | ORAL_TABLET | Freq: Three times a day (TID) | ORAL | Status: DC | PRN

## 2023-03-28 MED ORDER — SEVELAMER CARBONATE 800 MG PO TABS
800.0000 mg | ORAL_TABLET | Freq: Three times a day (TID) | ORAL | Status: DC
  Administered 2023-03-28 – 2023-03-30 (×5): 800 mg via ORAL
  Filled 2023-03-28 (×6): qty 1

## 2023-03-28 MED ORDER — ATORVASTATIN CALCIUM 20 MG PO TABS
20.0000 mg | ORAL_TABLET | Freq: Every day | ORAL | Status: DC
Start: 2023-03-28 — End: 2023-03-30
  Administered 2023-03-28 – 2023-03-29 (×2): 20 mg via ORAL
  Filled 2023-03-28 (×2): qty 1

## 2023-03-28 MED ORDER — GABAPENTIN 300 MG PO CAPS
300.0000 mg | ORAL_CAPSULE | Freq: Every day | ORAL | Status: DC
  Administered 2023-03-28 – 2023-03-29 (×2): 300 mg via ORAL
  Filled 2023-03-28 (×2): qty 1

## 2023-03-28 MED ORDER — DEXTROSE 50 % IV SOLN
1.0000 | Freq: Once | INTRAVENOUS | Status: DC
  Filled 2023-03-28: qty 50

## 2023-03-28 MED ORDER — ACETAMINOPHEN 325 MG PO TABS: 650.0000 mg | ORAL_TABLET | Freq: Four times a day (QID) | ORAL | Status: DC | PRN

## 2023-03-28 MED ORDER — SODIUM BICARBONATE 8.4 % IV SOLN
50.0000 meq | Freq: Once | INTRAVENOUS | Status: DC
  Filled 2023-03-28: qty 50

## 2023-03-28 MED ORDER — STROKE: EARLY STAGES OF RECOVERY BOOK: Freq: Once | Status: AC

## 2023-03-28 MED ORDER — SODIUM CHLORIDE 0.9 % IV SOLN: INTRAVENOUS | Status: AC

## 2023-03-28 MED ORDER — HEPARIN SODIUM (PORCINE) 5000 UNIT/ML IJ SOLN
5000.0000 [IU] | Freq: Two times a day (BID) | INTRAMUSCULAR | Status: DC
  Administered 2023-03-28 – 2023-03-29 (×3): 5000 [IU] via SUBCUTANEOUS
  Filled 2023-03-28 (×3): qty 1

## 2023-03-28 MED ORDER — LEVOTHYROXINE SODIUM 25 MCG PO TABS
125.0000 ug | ORAL_TABLET | Freq: Every day | ORAL | Status: DC
  Administered 2023-03-29 – 2023-03-30 (×2): 125 ug via ORAL
  Filled 2023-03-28 (×3): qty 1

## 2023-03-28 MED ORDER — CHLORHEXIDINE GLUCONATE CLOTH 2 % EX PADS
6.0000 | MEDICATED_PAD | Freq: Every day | CUTANEOUS | Status: DC
  Filled 2023-03-28: qty 6

## 2023-03-28 MED ORDER — FLUTICASONE PROPIONATE 50 MCG/ACT NA SUSP
1.0000 | Freq: Two times a day (BID) | NASAL | Status: DC
  Filled 2023-03-28 (×2): qty 16

## 2023-03-28 MED ORDER — SODIUM ZIRCONIUM CYCLOSILICATE 10 G PO PACK
10.0000 g | PACK | Freq: Once | ORAL | Status: DC
  Filled 2023-03-28: qty 1

## 2023-03-28 MED ORDER — PENTAFLUOROPROP-TETRAFLUOROETH EX AERO: 1.0000 | INHALATION_SPRAY | CUTANEOUS | Status: DC | PRN

## 2023-03-28 MED ORDER — SENNOSIDES-DOCUSATE SODIUM 8.6-50 MG PO TABS: 1.0000 | ORAL_TABLET | Freq: Every evening | ORAL | Status: DC | PRN

## 2023-03-28 MED ORDER — INSULIN ASPART 100 UNIT/ML IV SOLN
5.0000 [IU] | Freq: Once | INTRAVENOUS | Status: DC
  Filled 2023-03-28: qty 0.05

## 2023-03-28 MED ORDER — SENNOSIDES-DOCUSATE SODIUM 8.6-50 MG PO TABS
2.0000 | ORAL_TABLET | Freq: Every day | ORAL | Status: DC
  Administered 2023-03-28 – 2023-03-29 (×2): 2 via ORAL
  Filled 2023-03-28 (×2): qty 2

## 2023-03-28 NOTE — Progress Notes (Signed)
PT Cancellation Note  Patient Details Name: Clova Goyne MRN: [id number] DOB: 22-Sep-1965   Cancelled Treatment:    Reason Eval/Treat Not Completed: Patient at procedure or test/unavailable PT orders received, chart reviewed. Pt noted to be off the floor at dialysis at this time. Will f/u as able.  Aleda Grana, PT, DPT 03/28/23, 1:02 PM   Sandi Mariscal 03/28/2023, 1:01 PM

## 2023-03-28 NOTE — ED Notes (Signed)
Pt transported to dialysis at this time. Stable at time of departure.

## 2023-03-28 NOTE — ED Provider Notes (Signed)
[facility] Provider Note    Event Date/Time   First MD Initiated Contact with Patient 03/28/23 [phone]     (approximate)   History   Weakness   HPI  Tonya Myers is a 57 y.o. female with history of ESRD who comes in with concern for weakness.  Patient reports having a fall on Wednesday without LOC.  She reports doing well on Thursday and Friday but then on Saturday and Sunday she was more lethargic laying in bed.  She did go to dialysis today but did not get treatment has not had treatment since last Wednesday.  Patient reports that she fell because her legs were weak.  Patient reports that she is also had some stuttering and some right sided tingling that is been going on since Friday.  She reports being concerned that she might have tardive dyskinesia.  Physical Exam   Triage Vital Signs: ED Triage Vitals  Encounter Vitals Group     BP 03/28/23 0716 (!) 189/97     Systolic BP Percentile --      Diastolic BP Percentile --      Pulse Rate 03/28/23 0716 80     Resp 03/28/23 0716 18     Temp 03/28/23 0716 98.4 F (36.9 C)     Temp Source 03/28/23 0716 Oral     SpO2 03/28/23 0716 97 %     Weight 03/28/23 0712 175 lb (79.4 kg)     Height 03/28/23 0712 5\' 6"  (1.676 m)     Head Circumference --      Peak Flow --      Pain Score 03/28/23 0709 0     Pain Loc --      Pain Education --      Exclude from Growth Chart --     Most recent vital signs: Vitals:   03/28/23 0716  BP: (!) 189/97  Pulse: 80  Resp: 18  Temp: 98.4 F (36.9 C)  SpO2: 97%     General: Awake, no distress.  CV:  Good peripheral perfusion.  Resp:  Normal effort.  Abd:  No distention.  Other:  Patient reports some right-sided tingling but she has got equal grip strength no pronator drift.  Equal strength in legs. Right arm fistula  ED Results / Procedures / Treatments   Labs (all labs ordered are listed, but only abnormal results are displayed) Labs Reviewed  BASIC  METABOLIC PANEL - Abnormal; Notable for the following components:      Result Value   Potassium 6.1 (*)    CO2 20 (*)    Glucose, Bld 159 (*)    BUN 121 (*)    Creatinine, Ser 12.50 (*)    Calcium 7.2 (*)    GFR, Estimated 3 (*)    Anion gap 19 (*)    All other components within normal limits  CBC - Abnormal; Notable for the following components:   RBC 3.32 (*)    Hemoglobin 9.6 (*)    HCT 30.4 (*)    RDW 17.0 (*)    Platelets 134 (*)    All other components within normal limits  URINALYSIS, ROUTINE W REFLEX MICROSCOPIC  CBG MONITORING, ED     EKG  My interpretation of EKG:  Sinus rhythm 74 without any ST elevations or T wave inversions, normal intervals.  Send there is an AV block but on my review of the EKG there is no AV block noted  RADIOLOGY I have reviewed  the CT head personally interpreted no evidence of intracranial hemorrhage  PROCEDURES:  Critical Care performed: No  .Critical Care  Performed by: Concha Se, MD Authorized by: Concha Se, MD   Critical care provider statement:    Critical care time (minutes):  30   Critical care was necessary to treat or prevent imminent or life-threatening deterioration of the following conditions: hyperkalemia.   Critical care was time spent personally by me on the following activities:  Development of treatment plan with patient or surrogate, discussions with consultants, evaluation of patient's response to treatment, examination of patient, ordering and review of laboratory studies, ordering and review of radiographic studies, ordering and performing treatments and interventions, pulse oximetry, re-evaluation of patient's condition and review of old charts    MEDICATIONS ORDERED IN ED: Medications - No data to display   IMPRESSION / MDM / ASSESSMENT AND PLAN / ED COURSE  I reviewed the triage vital signs and the nursing notes.   Patient's presentation is most consistent with acute presentation with potential  threat to life or bodily function.   Patient comes in with weakness.  Labs ordered evaluate for electrolyte abnormalities, CT imaging to evaluate for intracranial hemorrhage, cervical fracture.  For the right sided tingling and speech difficulties onset of symptoms has been greater than 48 hours.  Stroke code was not called and patient can get an MRI when she is more stable from a potassium perspective.  Will hold off on ordering now given I do not want her to go down before getting dialysis as that is the more urgent issue.  I also do not see any obvious meds that could be causing tardive dyskinesia but they can work this up inpatient if she continues to have symptoms after treatment of her electrolyte abnormalities  CTs were negative labs were notable for elevated potassium.  EKG without any EKG changes.  BUN is significantly elevated suspect that is the cause of patient's weakness.  Will discuss with hospital team for admission and discussed with nephrology for dialysis.  Will see if they want me to give any medications to help shift the potassium while waiting on dialysis.  No obvious EKG changes  9:52 AM still unable to contact nephrology no one listed in amion.  Will treat the hyperkalemia while awaiting dialysis with some Lokelma bicarb and insulin.  I did discuss the hospital team for admission and patient will need nephrology consultation.  The patient is on the cardiac monitor to evaluate for evidence of arrhythmia and/or significant heart rate changes.      FINAL CLINICAL IMPRESSION(S) / ED DIAGNOSES   Final diagnoses:  Weakness  ESRD (end stage renal disease) on dialysis (HCC)  Hyperkalemia  Elevated BUN     Rx / DC Orders   ED Discharge Orders     None        Note:  This document was prepared using Dragon voice recognition software and may include unintentional dictation errors.   Concha Se, MD 03/28/23 [phone]

## 2023-03-28 NOTE — ED Notes (Signed)
Pt refusing MRI. States that she was told that if she was feeling better after dialysis she didn't need the MRI. Also states "I am feeling MUCH better".

## 2023-03-28 NOTE — Progress Notes (Signed)
[facility]  ROUNDING NOTE   Subjective:   Tonya Myers is a 57 year old female with past medical history including depression, type 2 diabetes, asthma, hypertension, hypothyroidism, CLL, and end-stage renal disease on hemodialysis.  She presents to the emergency department with complaints of weakness and has been admitted for Hyperkalemia [E87.5] Weakness [R53.1] Stroke (HCC) [I63.9] Elevated BUN [R79.9] ESRD (end stage renal disease) on dialysis (HCC) [N18.6, Z99.2]  Patient is known our practice and receives outpatient dialysis treatments at [facility] on a MWF schedule, supervised by [facility] physicians.  Patient received her last treatment on Tuesday, due to holiday scheduling.  Patient states she did not go to treatment as scheduled on Friday or Saturday.  States she awoke Friday and was very confused.  Reports progressive weakness but was able to cook Thanksgiving dinner on Thursday.  Denies shortness of breath.  No known fever or chills.  Denies nausea, vomiting, or diarrhea.  States she is tired of feeling sick all the time.  Significant labs on ED arrival include potassium 6.1, serum bicarb 20, BUN 121, creatinine 12.5 with GFR 3, calcium 7.2, and hemoglobin 9.6.  UA appears cloudy with some leukocytes and bacteria.  CT head and cervical spine show age-related changes.  Echo shows EF 55 to 60% with mild LVH and mild MVR.  We have been consulted to manage dialysis needs.   Objective:  Vital signs in last 24 hours:  Temp:  [97.9 F (36.6 C)-98.4 F (36.9 C)] 97.9 F (36.6 C) (12/02 1250) Pulse Rate:  [60-80] 60 (12/02 1330) Resp:  [10-22] 10 (12/02 1330) BP: (143-189)/(83-98) 143/83 (12/02 1330) SpO2:  [93 %-100 %] 99 % (12/02 1330) Weight:  [79.4 kg-93 kg] 93 kg (12/02 1313)  Weight change:  Filed Weights   03/28/23 0712 03/28/23 1313  Weight: 79.4 kg 93 kg    Intake/Output: No intake/output data recorded.   Intake/Output this shift:  No  intake/output data recorded.  Physical Exam: General: NAD  Head: Normocephalic, atraumatic. Moist oral mucosal membranes  Eyes: Anicteric  Lungs:  Clear to auscultation, normal effort, room air  Heart: Regular rate and rhythm  Abdomen:  Soft, nontender, nondistended  Extremities: No peripheral edema.  Neurologic: Alert and oriented, moving all four extremities  Skin: No lesions  Access: Right AVF    Basic Metabolic Panel: Recent Labs  Lab 03/28/23 0743  NA 142  K 6.1*  CL 103  CO2 20*  GLUCOSE 159*  BUN 121*  CREATININE 12.50*  CALCIUM 7.2*    Liver Function Tests: No results for input(s): "AST", "ALT", "ALKPHOS", "BILITOT", "PROT", "ALBUMIN" in the last 168 hours. No results for input(s): "LIPASE", "AMYLASE" in the last 168 hours. No results for input(s): "AMMONIA" in the last 168 hours.  CBC: Recent Labs  Lab 03/28/23 0743  WBC 4.2  HGB 9.6*  HCT 30.4*  MCV 91.6  PLT 134*    Cardiac Enzymes: No results for input(s): "CKTOTAL", "CKMB", "CKMBINDEX", "TROPONINI" in the last 168 hours.  BNP: Invalid input(s): "POCBNP"  CBG: No results for input(s): "GLUCAP" in the last 168 hours.  Microbiology: Results for orders placed or performed during the hospital encounter of 05/03/22  Resp panel by RT-PCR (RSV, Flu A&B, Covid) Anterior Nasal Swab     Status: None   Collection Time: 05/03/22  7:05 AM   Specimen: Anterior Nasal Swab  Result Value Ref Range Status   SARS Coronavirus 2 by RT PCR NEGATIVE NEGATIVE Final    Comment: (NOTE) SARS-CoV-2 target  nucleic acids are NOT DETECTED.  The SARS-CoV-2 RNA is generally detectable in upper respiratory specimens during the acute phase of infection. The lowest concentration of SARS-CoV-2 viral copies this assay can detect is 138 copies/mL. A negative result does not preclude SARS-Cov-2 infection and should not be used as the sole basis for treatment or other patient management decisions. A negative result may occur  with  improper specimen collection/handling, submission of specimen other than nasopharyngeal swab, presence of viral mutation(s) within the areas targeted by this assay, and inadequate number of viral copies(<138 copies/mL). A negative result must be combined with clinical observations, patient history, and epidemiological information. The expected result is Negative.  Fact Sheet for Patients:  BloggerCourse.com  Fact Sheet for Healthcare Providers:  SeriousBroker.it  This test is no t yet approved or cleared by the [place] FDA and  has been authorized for detection and/or diagnosis of SARS-CoV-2 by FDA under an Emergency Use Authorization (EUA). This EUA will remain  in effect (meaning this test can be used) for the duration of the COVID-19 declaration under Section 564(b)(1) of the Act, 21 U.S.C.section 360bbb-3(b)(1), unless the authorization is terminated  or revoked sooner.       Influenza A by PCR NEGATIVE NEGATIVE Final   Influenza B by PCR NEGATIVE NEGATIVE Final    Comment: (NOTE) The Xpert Xpress SARS-CoV-2/FLU/RSV plus assay is intended as an aid in the diagnosis of influenza from Nasopharyngeal swab specimens and should not be used as a sole basis for treatment. Nasal washings and aspirates are unacceptable for Xpert Xpress SARS-CoV-2/FLU/RSV testing.  Fact Sheet for Patients: BloggerCourse.com  Fact Sheet for Healthcare Providers: SeriousBroker.it  This test is not yet approved or cleared by the [place] FDA and has been authorized for detection and/or diagnosis of SARS-CoV-2 by FDA under an Emergency Use Authorization (EUA). This EUA will remain in effect (meaning this test can be used) for the duration of the COVID-19 declaration under Section 564(b)(1) of the Act, 21 U.S.C. section 360bbb-3(b)(1), unless the authorization is terminated  or revoked.     Resp Syncytial Virus by PCR NEGATIVE NEGATIVE Final    Comment: (NOTE) Fact Sheet for Patients: BloggerCourse.com  Fact Sheet for Healthcare Providers: SeriousBroker.it  This test is not yet approved or cleared by the [place] FDA and has been authorized for detection and/or diagnosis of SARS-CoV-2 by FDA under an Emergency Use Authorization (EUA). This EUA will remain in effect (meaning this test can be used) for the duration of the COVID-19 declaration under Section 564(b)(1) of the Act, 21 U.S.C. section 360bbb-3(b)(1), unless the authorization is terminated or revoked.  Performed at [facility], [address] [place], [place] [address]   C Difficile Quick Screen w PCR reflex     Status: None   Collection Time: 05/03/22  9:24 AM   Specimen: STOOL  Result Value Ref Range Status   C Diff antigen NEGATIVE NEGATIVE Final   C Diff toxin NEGATIVE NEGATIVE Final   C Diff interpretation No C. difficile detected.  Final    Comment: Performed at [facility], [address] [place], [place] [address]  Gastrointestinal Panel by PCR , Stool     Status: Abnormal   Collection Time: 05/03/22  9:24 AM   Specimen: STOOL  Result Value Ref Range Status   Campylobacter species NOT DETECTED NOT DETECTED Final   Plesimonas shigelloides NOT DETECTED NOT DETECTED Final   Salmonella species NOT DETECTED NOT DETECTED Final   Yersinia  enterocolitica NOT DETECTED NOT DETECTED Final   Vibrio species NOT DETECTED NOT DETECTED Final   Vibrio cholerae NOT DETECTED NOT DETECTED Final   Enteroaggregative E coli (EAEC) NOT DETECTED NOT DETECTED Final   Enteropathogenic E coli (EPEC) NOT DETECTED NOT DETECTED Final   Enterotoxigenic E coli (ETEC) NOT DETECTED NOT DETECTED Final   Shiga like toxin producing E coli (STEC) NOT DETECTED NOT DETECTED Final   Shigella/Enteroinvasive E coli (EIEC) NOT DETECTED NOT  DETECTED Final   Cryptosporidium NOT DETECTED NOT DETECTED Final   Cyclospora cayetanensis NOT DETECTED NOT DETECTED Final   Entamoeba histolytica NOT DETECTED NOT DETECTED Final   Giardia lamblia NOT DETECTED NOT DETECTED Final   Adenovirus F40/41 DETECTED (A) NOT DETECTED Final   Astrovirus NOT DETECTED NOT DETECTED Final   Norovirus GI/GII NOT DETECTED NOT DETECTED Final   Rotavirus A NOT DETECTED NOT DETECTED Final   Sapovirus (I, II, IV, and V) NOT DETECTED NOT DETECTED Final    Comment: Performed at [facility], [address] [place], [place] [address]    Coagulation Studies: No results for input(s): "LABPROT", "INR" in the last 72 hours.  Urinalysis: Recent Labs    03/28/23 1216  COLORURINE YELLOW*  LABSPEC 1.009  PHURINE 9.0*  GLUCOSEU 50*  HGBUR NEGATIVE  BILIRUBINUR NEGATIVE  KETONESUR NEGATIVE  PROTEINUR 30*  NITRITE NEGATIVE  LEUKOCYTESUR MODERATE*      Imaging: ECHOCARDIOGRAM COMPLETE  Result Date: 03/28/2023    ECHOCARDIOGRAM REPORT   Patient Name:   GERARDINE BAND Date of Exam: 03/28/2023 Medical Rec #:  [id number]       Height:       66.0 in Accession #:    [id number]      Weight:       175.0 lb Date of Birth:  01-Dec-1965       BSA:          1.889 m Patient Age:    57 years        BP:           185/98 mmHg Patient Gender: F               HR:           60 bpm. Exam Location:  [facility] Procedure: 2D Echo, Cardiac Doppler and Color Doppler Indications:     Stroke I63.9  History:         Patient has prior history of Echocardiogram examinations, most                  recent 12/10/2021.  Sonographer:     Cristela Blue Referring Phys:  [id number] Emeline General Diagnosing Phys: Lorine Bears MD IMPRESSIONS  1. Left ventricular ejection fraction, by estimation, is 55 to 60%. The left ventricle has normal function. The left ventricle has no regional wall motion abnormalities. There is mild left ventricular hypertrophy. Left ventricular diastolic parameters are  indeterminate.  2. Right ventricular systolic function is normal. The right ventricular size is normal. Tricuspid regurgitation signal is inadequate for assessing PA pressure.  3. Left atrial size was mildly dilated.  4. The mitral valve is normal in structure. Mild mitral valve regurgitation. No evidence of mitral stenosis.  5. The aortic valve is normal in structure. Aortic valve regurgitation is not visualized. No aortic stenosis is present. FINDINGS  Left Ventricle: Left ventricular ejection fraction, by estimation, is 55 to 60%. The left ventricle has normal function. The left ventricle has no regional wall motion  abnormalities. The left ventricular internal cavity size was normal in size. There is  mild left ventricular hypertrophy. Left ventricular diastolic parameters are indeterminate. Right Ventricle: The right ventricular size is normal. No increase in right ventricular wall thickness. Right ventricular systolic function is normal. Tricuspid regurgitation signal is inadequate for assessing PA pressure. Left Atrium: Left atrial size was mildly dilated. Right Atrium: Right atrial size was normal in size. Pericardium: There is no evidence of pericardial effusion. Mitral Valve: The mitral valve is normal in structure. Mild mitral valve regurgitation. No evidence of mitral valve stenosis. Tricuspid Valve: The tricuspid valve is normal in structure. Tricuspid valve regurgitation is not demonstrated. No evidence of tricuspid stenosis. Aortic Valve: The aortic valve is normal in structure. Aortic valve regurgitation is not visualized. No aortic stenosis is present. Aortic valve mean gradient measures 3.0 mmHg. Aortic valve peak gradient measures 6.2 mmHg. Aortic valve area, by VTI measures 3.18 cm. Pulmonic Valve: The pulmonic valve was normal in structure. Pulmonic valve regurgitation is not visualized. No evidence of pulmonic stenosis. Aorta: The aortic root is normal in size and structure. Venous: The  inferior vena cava was not well visualized. IAS/Shunts: No atrial level shunt detected by color flow Doppler.  LEFT VENTRICLE PLAX 2D LVIDd:         4.90 cm   Diastology LVIDs:         3.60 cm   LV e' medial:    7.07 cm/s LV PW:         1.30 cm   LV E/e' medial:  17.5 LV IVS:        1.30 cm   LV e' lateral:   12.50 cm/s LVOT diam:     2.10 cm   LV E/e' lateral: 9.9 LV SV:         89 LV SV Index:   47 LVOT Area:     3.46 cm  RIGHT VENTRICLE RV Basal diam:  2.90 cm RV Mid diam:    2.60 cm RV S prime:     13.80 cm/s TAPSE (M-mode): 2.7 cm LEFT ATRIUM             Index        RIGHT ATRIUM           Index LA diam:        4.80 cm 2.54 cm/m   RA Area:     10.60 cm LA Vol (A2C):   57.4 ml 30.38 ml/m  RA Volume:   21.10 ml  11.17 ml/m LA Vol (A4C):   33.5 ml 17.73 ml/m LA Biplane Vol: 47.5 ml 25.14 ml/m  AORTIC VALVE AV Area (Vmax):    2.68 cm AV Area (Vmean):   2.68 cm AV Area (VTI):     3.18 cm AV Vmax:           125.00 cm/s AV Vmean:          86.400 cm/s AV VTI:            0.280 m AV Peak Grad:      6.2 mmHg AV Mean Grad:      3.0 mmHg LVOT Vmax:         96.60 cm/s LVOT Vmean:        66.800 cm/s LVOT VTI:          0.257 m LVOT/AV VTI ratio: 0.92  AORTA Ao Root diam: 2.90 cm MITRAL VALVE  TRICUSPID VALVE MV Area (PHT): 3.83 cm     TR Peak grad:   25.2 mmHg MV Decel Time: 198 msec     TR Vmax:        251.00 cm/s MV E velocity: 124.00 cm/s MV A velocity: 78.60 cm/s   SHUNTS MV E/A ratio:  1.58         Systemic VTI:  0.26 m                             Systemic Diam: 2.10 cm Lorine Bears MD Electronically signed by Lorine Bears MD Signature Date/Time: 03/28/2023/1:02:46 PM    Final    CT HEAD WO CONTRAST ( )  Result Date: 03/28/2023 CLINICAL DATA:  Weakness.  History of fall on Wednesday. EXAM: CT HEAD WITHOUT CONTRAST CT CERVICAL SPINE WITHOUT CONTRAST TECHNIQUE: Multidetector CT imaging of the head and cervical spine was performed following the standard protocol without intravenous contrast.  Multiplanar CT image reconstructions of the cervical spine were also generated. RADIATION DOSE REDUCTION: This exam was performed according to the departmental dose-optimization program which includes automated exposure control, adjustment of the mA and/or kV according to patient size and/or use of iterative reconstruction technique. COMPARISON:  Head CT 12/15/2021 FINDINGS: CT HEAD FINDINGS Brain: No evidence of acute infarction, hemorrhage, hydrocephalus, extra-axial collection or mass lesion/mass effect. Vascular: No hyperdense vessel or unexpected calcification. Skull: Normal. Negative for fracture or focal lesion. Sinuses/Orbits: No evidence of injury CT CERVICAL SPINE FINDINGS Alignment: Normal. Skull base and vertebrae: No acute fracture. No primary bone lesion or focal pathologic process. Soft tissues and spinal canal: No prevertebral fluid or swelling. No visible canal hematoma. Disc levels: Generalized disc space narrowing with endplate ridging and disc protrusions with buttressing osteophytes, especially seen at C3-4 and C4-5 where there is ventral cord contact. Upper chest: Negative IMPRESSION: 1. No evidence of acute intracranial or cervical spine injury. 2. 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 Electronically Signed   By: Tiburcio Pea M.D.   On: 03/28/2023 07:39   CT Cervical Spine Wo Contrast  Result Date: 03/28/2023 CLINICAL DATA:  Weakness.  History of fall on Wednesday. EXAM: CT HEAD WITHOUT CONTRAST CT CERVICAL SPINE WITHOUT CONTRAST TECHNIQUE: Multidetector CT imaging of the head and cervical spine was performed following the standard protocol without intravenous contrast. Multiplanar CT image reconstructions of the cervical spine were also generated. RADIATION DOSE REDUCTION: This exam was performed according to the departmental dose-optimization program which includes automated exposure control, adjustment of the mA and/or kV  according to patient size and/or use of iterative reconstruction technique. COMPARISON:  Head CT 12/15/2021 FINDINGS: CT HEAD FINDINGS Brain: No evidence of acute infarction, hemorrhage, hydrocephalus, extra-axial collection or mass lesion/mass effect. Vascular: No hyperdense vessel or unexpected calcification. Skull: Normal. Negative for fracture or focal lesion. Sinuses/Orbits: No evidence of injury CT CERVICAL SPINE FINDINGS Alignment: Normal. Skull base and vertebrae: No acute fracture. No primary bone lesion or focal pathologic process. Soft tissues and spinal canal: No prevertebral fluid or swelling. No visible canal hematoma. Disc levels: Generalized disc space narrowing with endplate ridging and disc protrusions with buttressing osteophytes, especially seen at C3-4 and C4-5 where there is ventral cord contact. Upper chest: Negative IMPRESSION: 1. No evidence of acute intracranial or cervical spine injury. 2. 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 Electronically Signed   By: Christiane Ha  Watts M.D.   On: 03/28/2023 07:39     Medications:    sodium chloride      [START ON 03/29/2023]  stroke: early stages of recovery book   Does not apply Once   amitriptyline  10 mg Oral QHS   aspirin EC  81 mg Oral Myers   atorvastatin  20 mg Oral Myers   Chlorhexidine Gluconate Cloth  6 each Topical Q0600   insulin aspart  5 Units Intravenous Once   And   dextrose  1 ampule Intravenous Once   fluticasone  1 spray Each Nare BID   gabapentin  300 mg Oral QHS   heparin  5,000 Units Subcutaneous Q12H   levothyroxine  125 mcg Oral Q0600   [START ON 03/29/2023] loratadine  10 mg Oral Myers   senna-docusate  2 tablet Oral QHS   sevelamer carbonate  800 mg Oral TID WC   sodium bicarbonate  50 mEq Intravenous Once   sodium zirconium cyclosilicate  10 g Oral Once   acetaminophen, albuterol, heparin, lidocaine-prilocaine, LORazepam, ondansetron,  pentafluoroprop-tetrafluoroeth, senna-docusate  Assessment/ Plan:  Ms. Kelila Devaul is a 57 y.o.  female with past medical history including depression, type 2 diabetes, asthma, hypertension, hypothyroidism, CLL, and end-stage renal disease on hemodialysis.  She presents to the emergency department with complaints of weakness and has been admitted for Hyperkalemia [E87.5] Weakness [R53.1] Stroke (HCC) [I63.9] Elevated BUN [R79.9] ESRD (end stage renal disease) on dialysis (HCC) [N18.6, Z99.2]  [facility] [place]/MWF/right AVF/89.1 kg  End-stage renal disease with hyperkalemia on hemodialysis.  Last treatment completed on Tuesday.  Potassium 6.1 on ED arrival.  Will provide urgent dialysis treatment on 1K bath.  Will continue to monitor for need of additional treatment tomorrow.  2. Anemia of chronic kidney disease Lab Results  Component Value Date   HGB 9.6 (L) 03/28/2023    Hemoglobin within desired range.  Patient receives Mircera at outpatient clinic.  Will continue to monitor for now.  3. Secondary Hyperparathyroidism: with outpatient labs: PTH 1325, phosphorus 6.6, calcium 7.8 on 02/28/23.   Lab Results  Component Value Date   CALCIUM 7.2 (L) 03/28/2023   PHOS 3.7 12/22/2021    Will continue to monitor bone minerals during this admission. Prescribed calcitriol and renvela outpatient.   4. 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 Home regimen includes Humalog. Most recent hemoglobin A1c is 4.9 on 12/09/21.     LOS: 0 Demontre Padin 12/2/20241:57 PM

## 2023-03-28 NOTE — ED Notes (Signed)
Pt says that she is tired and just doesn't feel like she belongs here. She started crying and saying that she is scared and is afraid of staying in the hospital.

## 2023-03-28 NOTE — ED Notes (Signed)
Echo at bedside

## 2023-03-28 NOTE — ED Triage Notes (Addendum)
Pt comes via EMS from home with c/o weakness. Pt did fall on Wednesday with no loc. Pt cooked on Thanksgiving and then on Friday she started feeling bad. Pt states Saturday and Sunday she laid in bed. Pt did go to dialysis today but no treatment and has not had treatment since last Wednesday.  Pt states she did hit her head when she fell and she just laid there until she could get up. Pt states her legs just feel so weak. Pt denies any sob or cp.

## 2023-03-28 NOTE — H&P (Signed)
History and Physical    Tonya Myers AOZ:[id number] DOB: Jul 01, 1965 DOA: 03/28/2023  PCP: Tonya Mina, MD (Confirm with patient/family/NH records and if not entered, this has to be entered at [facility] point of entry) Patient coming from: Home  I have personally briefly reviewed patient's old medical records in [facility]  Chief Complaint: Trouble speaking  HPI: Tonya Myers is a 57 y.o. female with medical history significant of ESRD on HD MWF, COPD, cervical radiculopathy, lumbar spine radiculopathy, IIDM, anxiety/depression, hypothyroidism, presented with worsening of speech problems and worsening of right hand weakness and numbness.  Symptoms started last Wednesday, patient started to feel new onset of a trouble speech.  She described asked that she does not have trouble to understand other people's talking or form sentences her mind but much trouble in controlling her tongue and lips.  Meantime she has had uncontrollable movement of some of the facial muscle.  Patient then looked up at Internet and thought that she had " tardive dyskinesia" .  Chronically she also has an intermittent right hand weakness and tingling sensation due to cervical radiculopathy, which has become constant since about Wednesday to a point that she could not hold a cup with right hand anymore.  She also complained about worsening of right foot weakness.  She has history of lumbar spine L5-S1 radiculopathy with chronic back pain and weakness of right foot however it also became worse since last week.  Today she went to her regular dialysis, the staff at the facility found her mumbling when talking which appeared to be new to them and she was instructed to come to ED.  Denied any choking or coughing after eating or drinking or trouble swallowing, no chest pain no shortness breath no cough no fever or chills.  She does not make urine anymore.  ED Course: Afebrile, blood pressure 180/90 nonhypoxic.  CT head and neck  showed no fracture or dislocation, no acute intracranial findings.  Chronic C4-5 radiculopathy.  Blood work showed uremia BUN 121, creatinine 12.5, K6.1, bicarb 20.  Emergency dialysis was arranged by nephrology.  Review of Systems: As per HPI otherwise 14 point review of systems negative.    Past Medical History:  Diagnosis Date   Acute hemorrhoid 03/11/2015   Anxiety    Arthritis    Asthma    Cancer (HCC)    Chronic back pain    CLL (chronic lymphocytic leukemia) (HCC)    Depression    Diabetes mellitus without complication (HCC)    Genital herpes    type 2   Hypertension    Hypothyroidism    Vertigo     Past Surgical History:  Procedure Laterality Date   ABDOMINAL HYSTERECTOMY     BILATERAL SALPINGOOPHORECTOMY  2009   BREAST BIOPSY Right 05/17/2016   FIBROADENOMATOUS CHANGE AND SCLEROSING ADENOSIS WITH   COLONOSCOPY WITH PROPOFOL N/A 06/16/2015   Procedure: COLONOSCOPY WITH PROPOFOL;  Surgeon: Elnita Maxwell, MD;  Location: [facility] ENDOSCOPY;  Service: Endoscopy;  Laterality: N/A;   DIALYSIS/PERMA CATHETER INSERTION N/A 12/17/2020   Procedure: DIALYSIS/PERMA CATHETER INSERTION;  Surgeon: Annice Needy, MD;  Location: [facility] INVASIVE CV LAB;  Service: Cardiovascular;  Laterality: N/A;   LAPAROSCOPIC SUPRACERVICAL HYSTERECTOMY  2009   due to leio   OOPHORECTOMY     TEMPORARY DIALYSIS CATHETER N/A 12/10/2020   Procedure: TEMPORARY DIALYSIS CATHETER;  Surgeon: Renford Dills, MD;  Location: [facility] INVASIVE CV LAB;  Service: Cardiovascular;  Laterality: N/A;   THORACIC LAMINECTOMY FOR EPIDURAL  ABSCESS Bilateral 06/17/2020   Procedure: THORACIC LAMINECTOMY FOR EPIDURAL ABSCESS;  Surgeon: Lucy Chris, MD;  Location: [facility] ORS;  Service: Neurosurgery;  Laterality: Bilateral;     reports that she has never smoked. She has never used smokeless tobacco. She reports that she does not currently use alcohol after a past usage of about 1.0 standard drink of alcohol per week. She reports  that she does not use drugs.  Allergies  Allergen Reactions   Amoxapine    Penicillins    Ceftazidime     Other reaction(s): Confusion, Hallucination, Unknown Encephalopathy - improved after dialysis/some concern for cephalosporin neurotoxicity Tolerated without confusion 06/2021. Encephalopathy - improved after dialysis/some concern for cephalosporin neurotoxicity Tolerated without confusion 06/2021.     Family History  Problem Relation Age of Onset   Cancer Paternal Aunt    Breast cancer Maternal Aunt 35     Prior to Admission medications   Medication Sig Start Date End Date Taking? Authorizing Provider  acetaminophen (TYLENOL) 325 MG tablet Take 2 tablets (650 mg total) by mouth every 6 (six) hours as needed for mild pain (or Fever >/= 101). 07/30/20   Angiulli, Mcarthur Rossetti, PA-C  albuterol (VENTOLIN HFA) 108 (90 Base) MCG/ACT inhaler Inhale 2 puffs into the lungs every 6 (six) hours as needed for wheezing or shortness of breath. 10/18/21   [provider]  amitriptyline (ELAVIL) 10 MG tablet Take 10 mg by mouth at bedtime.    [provider]  ascorbic acid (VITAMIN C) 500 MG tablet Take 1 tablet (500 mg total) by mouth 2 (two) times daily. 12/22/21   Tresa Moore, MD  aspirin EC 81 MG tablet Take 1 tablet (81 mg total) by mouth daily. Swallow whole. 10/02/20   Delfino Lovett, MD  atorvastatin (LIPITOR) 20 MG tablet Take 20 mg by mouth daily. 10/18/21   [provider]  cetirizine (ZYRTEC) 10 MG tablet Take 10 mg by mouth daily. 04/09/22 04/09/23  [provider]  Continuous Blood Gluc Sensor (DEXCOM G6 SENSOR) MISC SMARTSIG:1 Each Topical Every 10 Days 08/12/20   [provider]  fluticasone (FLONASE) 50 MCG/ACT nasal spray 1 spray 2 (two) times daily.    [provider]  folic acid (FOLVITE) 1 MG tablet Take 1 tablet by mouth daily. 10/18/21   [provider]  gabapentin (NEURONTIN) 300 MG capsule Take 1 capsule (300 mg  total) by mouth 3 (three) times daily. 10/05/22   Felecia Shelling, DPM  levothyroxine (SYNTHROID) 125 MCG tablet Take 125 mcg by mouth daily. 12/08/21   [provider]  lidocaine (LIDODERM) 5 % Place 2 patches onto the skin daily. Remove & Discard patch within 12 hours or as directed by MD 12/24/21   Alford Highland, MD  multivitamin (RENA-VIT) TABS tablet Take 1 tablet by mouth at bedtime. 12/22/21   Sreenath, Jonelle Sports, MD  ondansetron (ZOFRAN-ODT) 4 MG disintegrating tablet Take 1 tablet (4 mg total) by mouth every 8 (eight) hours as needed for nausea or vomiting. 05/01/22   Jene Every, MD  senna-docusate (SENOKOT-S) 8.6-50 MG tablet Take 2 tablets by mouth at bedtime. 12/22/21   Tresa Moore, MD  sevelamer carbonate (RENVELA) 800 MG tablet Take 800 mg by mouth 3 (three) times daily. 12/08/21   [provider]  valACYclovir (VALTREX) 500 MG tablet Take 500 mg by mouth daily.    [provider]  zinc sulfate 220 (50 Zn) MG capsule Take 1 capsule (220 mg total) by mouth daily.  12/23/21   Tresa Moore, MD  budesonide-formoterol (SYMBICORT) 160-4.5 MCG/ACT inhaler Inhale into the lungs. 07/14/18 03/15/19  [provider]    Physical Exam: Vitals:   03/28/23 0712 03/28/23 0716 03/28/23 1021  BP:  (!) 189/97 (!) 185/98  Pulse:  80 60  Resp:  18 18  Temp:  98.4 F (36.9 C) 97.9 F (36.6 C)  TempSrc:  Oral Oral  SpO2:  97% 93%  Weight: 79.4 kg    Height: 5\' 6"  (1.676 m)      Constitutional: NAD, calm, comfortable Vitals:   03/28/23 0712 03/28/23 0716 03/28/23 1021  BP:  (!) 189/97 (!) 185/98  Pulse:  80 60  Resp:  18 18  Temp:  98.4 F (36.9 C) 97.9 F (36.6 C)  TempSrc:  Oral Oral  SpO2:  97% 93%  Weight: 79.4 kg    Height: 5\' 6"  (1.676 m)     Eyes: PERRL, lids and conjunctivae normal ENMT: Mucous membranes are moist. Posterior pharynx clear of any exudate or lesions.Normal dentition.  Neck: normal, supple, no masses, no  thyromegaly Respiratory: clear to auscultation bilaterally, no wheezing, no crackles. Normal respiratory effort. No accessory muscle use.  Cardiovascular: Regular rate and rhythm, no murmurs / rubs / gallops. No extremity edema. 2+ pedal pulses. No carotid bruits.  Abdomen: no tenderness, no masses palpated. No hepatosplenomegaly. Bowel sounds positive.  Musculoskeletal: no clubbing / cyanosis. No joint deformity upper and lower extremities. Good ROM, no contractures. Normal muscle tone.  Skin: no rashes, lesions, ulcers. No induration Neurologic: CN 2-12 grossly intact. Sensation intact, DTR normal.  Muscle strength 3/5 of right hand compared to 5/5 on the left side.  Right foot dorsal extension strength 4/5 compared to 5/5 on the left side Psychiatric: Normal judgment and insight. Alert and oriented x 3. Normal mood.     Labs on Admission: I have personally reviewed following labs and imaging studies  CBC: Recent Labs  Lab 03/28/23 0743  WBC 4.2  HGB 9.6*  HCT 30.4*  MCV 91.6  PLT 134*   Basic Metabolic Panel: Recent Labs  Lab 03/28/23 0743  NA 142  K 6.1*  CL 103  CO2 20*  GLUCOSE 159*  BUN 121*  CREATININE 12.50*  CALCIUM 7.2*   GFR: Estimated Creatinine Clearance: 5.1 mL/min (A) (by C-G formula based on SCr of 12.5 mg/dL (H)). Liver Function Tests: No results for input(s): "AST", "ALT", "ALKPHOS", "BILITOT", "PROT", "ALBUMIN" in the last 168 hours. No results for input(s): "LIPASE", "AMYLASE" in the last 168 hours. No results for input(s): "AMMONIA" in the last 168 hours. Coagulation Profile: No results for input(s): "INR", "PROTIME" in the last 168 hours. Cardiac Enzymes: No results for input(s): "CKTOTAL", "CKMB", "CKMBINDEX", "TROPONINI" in the last 168 hours. BNP (last 3 results) No results for input(s): "PROBNP" in the last 8760 hours. HbA1C: No results for input(s): "HGBA1C" in the last 72 hours. CBG: No results for input(s): "GLUCAP" in the last 168  hours. Lipid Profile: No results for input(s): "CHOL", "HDL", "LDLCALC", "TRIG", "CHOLHDL", "LDLDIRECT" in the last 72 hours. Thyroid Function Tests: No results for input(s): "TSH", "T4TOTAL", "FREET4", "T3FREE", "THYROIDAB" in the last 72 hours. Anemia Panel: No results for input(s): "VITAMINB12", "FOLATE", "FERRITIN", "TIBC", "IRON", "RETICCTPCT" in the last 72 hours. Urine analysis:    Component Value Date/Time   COLORURINE YELLOW (A) 12/10/2021 2100   APPEARANCEUR HAZY (A) 12/10/2021 2100   LABSPEC 1.008 12/10/2021 2100   PHURINE 9.0 (H) 12/10/2021 2100   GLUCOSEU  NEGATIVE 12/10/2021 2100   HGBUR NEGATIVE 12/10/2021 2100   BILIRUBINUR NEGATIVE 12/10/2021 2100   KETONESUR NEGATIVE 12/10/2021 2100   PROTEINUR 100 (A) 12/10/2021 2100   NITRITE NEGATIVE 12/10/2021 2100   LEUKOCYTESUR LARGE (A) 12/10/2021 2100    Radiological Exams on Admission: CT HEAD WO CONTRAST ( )  Result Date: 03/28/2023 CLINICAL DATA:  Weakness.  History of fall on Wednesday. EXAM: CT HEAD WITHOUT CONTRAST CT CERVICAL SPINE WITHOUT CONTRAST TECHNIQUE: Multidetector CT imaging of the head and cervical spine was performed following the standard protocol without intravenous contrast. Multiplanar CT image reconstructions of the cervical spine were also generated. RADIATION DOSE REDUCTION: This exam was performed according to the departmental dose-optimization program which includes automated exposure control, adjustment of the mA and/or kV according to patient size and/or use of iterative reconstruction technique. COMPARISON:  Head CT 12/15/2021 FINDINGS: CT HEAD FINDINGS Brain: No evidence of acute infarction, hemorrhage, hydrocephalus, extra-axial collection or mass lesion/mass effect. Vascular: No hyperdense vessel or unexpected calcification. Skull: Normal. Negative for fracture or focal lesion. Sinuses/Orbits: No evidence of injury CT CERVICAL SPINE FINDINGS Alignment: Normal. Skull base and vertebrae: No acute  fracture. No primary bone lesion or focal pathologic process. Soft tissues and spinal canal: No prevertebral fluid or swelling. No visible canal hematoma. Disc levels: Generalized disc space narrowing with endplate ridging and disc protrusions with buttressing osteophytes, especially seen at C3-4 and C4-5 where there is ventral cord contact. Upper chest: Negative IMPRESSION: 1. No evidence of acute intracranial or cervical spine injury. 2. 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 Electronically Signed   By: Tiburcio Pea M.D.   On: 03/28/2023 07:39   CT Cervical Spine Wo Contrast  Result Date: 03/28/2023 CLINICAL DATA:  Weakness.  History of fall on Wednesday. EXAM: CT HEAD WITHOUT CONTRAST CT CERVICAL SPINE WITHOUT CONTRAST TECHNIQUE: Multidetector CT imaging of the head and cervical spine was performed following the standard protocol without intravenous contrast. Multiplanar CT image reconstructions of the cervical spine were also generated. RADIATION DOSE REDUCTION: This exam was performed according to the departmental dose-optimization program which includes automated exposure control, adjustment of the mA and/or kV according to patient size and/or use of iterative reconstruction technique. COMPARISON:  Head CT 12/15/2021 FINDINGS: CT HEAD FINDINGS Brain: No evidence of acute infarction, hemorrhage, hydrocephalus, extra-axial collection or mass lesion/mass effect. Vascular: No hyperdense vessel or unexpected calcification. Skull: Normal. Negative for fracture or focal lesion. Sinuses/Orbits: No evidence of injury CT CERVICAL SPINE FINDINGS Alignment: Normal. Skull base and vertebrae: No acute fracture. No primary bone lesion or focal pathologic process. Soft tissues and spinal canal: No prevertebral fluid or swelling. No visible canal hematoma. Disc levels: Generalized disc space narrowing with endplate ridging and disc protrusions with  buttressing osteophytes, especially seen at C3-4 and C4-5 where there is ventral cord contact. Upper chest: Negative IMPRESSION: 1. No evidence of acute intracranial or cervical spine injury. 2. 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 Electronically Signed   By: Tiburcio Pea M.D.   On: 03/28/2023 07:39    EKG: Independently reviewed.  Sinus rhythm, no acute ST changes.  Assessment/Plan Principal Problem:   Stroke [facility]) Active Problems:   Dysarthria   Uremia  (please populate well all problems here in Problem List. (For example, if patient is on BP meds at home and you resume or decide to hold them, it is a problem that needs to be her.  Same for CAD, COPD, HLD and so on)  Acute/subacute dysarthria -Rule out stroke -MRI of the brain -Frequent neurochecks -Speech evaluation -Echocardiogram -Other DDx, speech problems as well as other neurological symptoms may also be related to her acute worsening of uremia.  Plan to reevaluate her neurological symptoms after emergency HD.  Question of tardive dyskinesia -Reveal patient medication list does not find any offending agent -Again suspect her symptoms largely attributed to worsening of uremia.  Emergency HD then reevaluate.  Hyperkalemia -Lokelma x 1 in the ED -Emergency dialysis  Worsening of right hand weakness Worsening of right foot weakness -Suspect worsening of baseline cervical spine radiculopathy and lumbar spine radiculopathy complicated by acute uremia -PT OT evaluation -Reevaluated after emergency HD -Outpatient cervical spine and lumbar spine MRI  COPD -Stable -Continue ICS and LABA  Hypothyroidism -Continue Synthroid   DVT prophylaxis: Subcu heparin Code Status: Full code Family Communication: None at bedside Disposition Plan: Patient is sick with strokelike symptoms as well as significant uremia and azotemia and hyperkalemia, requiring emergency HD and  inpatient stroke workup, expect more than 2 midnight hospital stay Consults called: Nephrology Admission status: Telemetry admission  Emeline General MD [facility] Pager [phone]  03/28/2023, 11:39 AM

## 2023-03-28 NOTE — ED Notes (Signed)
Informed RN Meghan via chat/ pt has bed assigned (105A)

## 2023-03-28 NOTE — Progress Notes (Signed)
OT Cancellation Note  Patient Details Name: Shellee Bisson MRN: [id number] DOB: 1965-12-19   Cancelled Treatment:    Reason Eval/Treat Not Completed: Patient at procedure or test/ unavailable. Pt currently off unit for dialysis. OT will re-attempt OT evaluation when pt is next available.   Jackquline Denmark, MS, OTR/L , CBIS ascom [phone]  03/28/23, 2:16 PM

## 2023-03-28 NOTE — Progress Notes (Signed)
  Received patient in bed to unit.   Informed consent signed and in chart.    TX duration: 3.5hrs     Transported back to floor  Hand-off given to patient's nurse. No c/o and no distress noted    Access used: RAVG Access issues: none   Total UF removed: 2.0 L Medication(s) given: none  Post HD weight: 91.1kg     Lynann Beaver  [facility]

## 2023-03-28 NOTE — Progress Notes (Signed)
*  PRELIMINARY RESULTS* Echocardiogram 2D Echocardiogram has been performed.  Cristela Blue 03/28/2023, 11:38 AM

## 2023-03-28 NOTE — ED Notes (Signed)
This RN messaged provider regarding dialysis handoff. Reported to MD that transport would be coming to get patient soon to for dialysis. Medications were not given on time by previous RN. MD approved plan to hold on medications due to the patient receiving dialysis today, early afternoon.

## 2023-03-28 NOTE — Progress Notes (Signed)
SLP Cancellation Note  Patient Details Name: Tonya Myers MRN: [id number] DOB: 1965-06-29   Cancelled treatment:       Reason Eval/Treat Not Completed: SLP screened, no needs identified, will sign off (chart reviewed)   Per chart, pt is currently off unit for dialysis. ST will f/u w/ evaluation tomorrow.     Jerilynn Som, MS, CCC-SLP Speech Language Pathologist Rehab Services; [facility] [phone] (ascom) Charissa Knowles 03/28/2023, 2:28 PM

## 2023-03-29 ENCOUNTER — Inpatient Hospital Stay: Payer: Medicare HMO

## 2023-03-29 DIAGNOSIS — E6609 Other obesity due to excess calories: Secondary | ICD-10-CM

## 2023-03-29 DIAGNOSIS — E875 Hyperkalemia: Secondary | ICD-10-CM | POA: Diagnosis not present

## 2023-03-29 DIAGNOSIS — R2990 Unspecified symptoms and signs involving the nervous system: Secondary | ICD-10-CM | POA: Diagnosis not present

## 2023-03-29 DIAGNOSIS — R471 Dysarthria and anarthria: Secondary | ICD-10-CM

## 2023-03-29 DIAGNOSIS — E66811 Obesity, class 1: Secondary | ICD-10-CM

## 2023-03-29 DIAGNOSIS — Z6832 Body mass index (BMI) 32.0-32.9, adult: Secondary | ICD-10-CM

## 2023-03-29 DIAGNOSIS — N19 Unspecified kidney failure: Secondary | ICD-10-CM | POA: Diagnosis not present

## 2023-03-29 LAB — HIV ANTIBODY (ROUTINE TESTING W REFLEX): HIV Screen 4th Generation wRfx: NONREACTIVE

## 2023-03-29 LAB — LIPID PANEL
Cholesterol: 179 mg/dL (ref 0–200)
HDL: 48 mg/dL (ref >40)
LDL Cholesterol: 93 mg/dL (ref 0–99)
Total CHOL/HDL Ratio: 3.7 {ratio}
Triglycerides: 191 mg/dL — ABNORMAL HIGH (ref <150)
VLDL: 38 mg/dL (ref 0–40)

## 2023-03-29 LAB — RENAL FUNCTION PANEL
Albumin: 3.5 g/dL (ref 3.5–5.0)
Anion gap: 14 (ref 5–15)
BUN: 75 mg/dL — ABNORMAL HIGH (ref 6–20)
CO2: 27 mmol/L (ref 22–32)
Calcium: 7.9 mg/dL — ABNORMAL LOW (ref 8.9–10.3)
Chloride: 97 mmol/L — ABNORMAL LOW (ref 98–111)
Creatinine, Ser: 8.4 mg/dL — ABNORMAL HIGH (ref 0.44–1.00)
GFR, Estimated: 5 mL/min — ABNORMAL LOW (ref >60)
Glucose, Bld: 84 mg/dL (ref 70–99)
Phosphorus: 5.8 mg/dL — ABNORMAL HIGH (ref 2.5–4.6)
Potassium: 5.6 mmol/L — ABNORMAL HIGH (ref 3.5–5.1)
Sodium: 138 mmol/L (ref 135–145)

## 2023-03-29 LAB — HEMOGLOBIN A1C
Hgb A1c MFr Bld: 5.6 % (ref 4.8–5.6)
Mean Plasma Glucose: 114.02 mg/dL

## 2023-03-29 LAB — HEPATITIS B SURFACE ANTIGEN: Hepatitis B Surface Ag: NONREACTIVE

## 2023-03-29 MED ORDER — VITAMIN C 500 MG PO TABS
500.0000 mg | ORAL_TABLET | Freq: Two times a day (BID) | ORAL | Status: DC
  Administered 2023-03-29: 500 mg via ORAL
  Filled 2023-03-29: qty 1

## 2023-03-29 MED ORDER — RENA-VITE PO TABS
1.0000 | ORAL_TABLET | Freq: Every day | ORAL | Status: DC
  Administered 2023-03-29: 1 via ORAL
  Filled 2023-03-29 (×2): qty 1

## 2023-03-29 MED ORDER — SODIUM ZIRCONIUM CYCLOSILICATE 10 G PO PACK
10.0000 g | PACK | Freq: Once | ORAL | Status: AC
  Administered 2023-03-29: 10 g via ORAL
  Filled 2023-03-29: qty 1

## 2023-03-29 MED ORDER — LOPERAMIDE HCL 2 MG PO CAPS
2.0000 mg | ORAL_CAPSULE | ORAL | Status: DC | PRN
  Administered 2023-03-29: 2 mg via ORAL
  Filled 2023-03-29: qty 1

## 2023-03-29 MED ORDER — LORAZEPAM 1 MG PO TABS
1.0000 mg | ORAL_TABLET | Freq: Once | ORAL | Status: AC
  Administered 2023-03-29: 1 mg via ORAL
  Filled 2023-03-29: qty 1

## 2023-03-29 MED ORDER — ZINC SULFATE 220 (50 ZN) MG PO CAPS
220.0000 mg | ORAL_CAPSULE | Freq: Every day | ORAL | Status: DC
  Administered 2023-03-29 – 2023-03-30 (×2): 220 mg via ORAL
  Filled 2023-03-29 (×2): qty 1

## 2023-03-29 MED ORDER — VALACYCLOVIR HCL 500 MG PO TABS
500.0000 mg | ORAL_TABLET | Freq: Every day | ORAL | Status: DC
  Administered 2023-03-29 – 2023-03-30 (×2): 500 mg via ORAL
  Filled 2023-03-29 (×2): qty 1

## 2023-03-29 MED ORDER — FOLIC ACID 1 MG PO TABS
1.0000 mg | ORAL_TABLET | Freq: Every day | ORAL | Status: DC
  Administered 2023-03-29 – 2023-03-30 (×2): 1 mg via ORAL
  Filled 2023-03-29 (×2): qty 1

## 2023-03-29 NOTE — Progress Notes (Signed)
SLP Cancellation Note  Patient Details Name: Tonya Myers MRN: [id number] DOB: [birth year]   Cancelled treatment:       Reason Eval/Treat Not Completed: SLP screened, no needs identified, will sign off (chart reviewed; met w/ pt in room.)  Pt denied any difficulty swallowing and is currently on a regular diet; tolerates swallowing pills w/ water per NSG. She was eager to order her Dinner meal and stated 2-3 items she would like.  Pt conversed in full conversation w/out expressive/receptive deficits noted; pt denied any speech-language deficits. She described how she was feeling yesterday re: her thoughts/speech Prior to having HD -- afterwards, she felt "more clear then". Speech clear, intelligible. No further skilled ST services indicated as pt appears at her baseline. Pt agreed. NSG to reconsult if any change in status while admitted.      Jerilynn Som, MS, CCC-SLP Speech Language Pathologist Rehab Services; [facility] [phone] (ascom) Jermiah Soderman 03/29/2023, 3:26 PM

## 2023-03-29 NOTE — Progress Notes (Signed)
Progress Note   Patient: Tonya Myers NGE:[id number] DOB: [birth year] DOA: 03/28/2023     1 DOS: the patient was seen and examined on 03/29/2023   Brief hospital course: Tonya Myers is a 57 y.o. female with medical history significant of ESRD on HD MWF, COPD, cervical radiculopathy, lumbar spine radiculopathy, IIDM, anxiety/depression, hypothyroidism, presented with worsening of speech problems and worsening of right hand weakness and numbness.   Patient is admitted to the hospitalist service for further management and evaluation to rule out stroke.  Assessment and Plan: Dysarthria Right hand weakness Rule out stroke vs possibly due to uremia, hyperkalemia. Patient does have a history of cervical and lumbar radiculopathy. Patient initially refused MRI brain, attributed symptoms to hyperkalemia.  But later she agreed to get MRI which is ordered. Continue to monitor neurochecks. Echocardiogram. SLP follow-up. PT OT evaluation.  Hyperkalemia: Status post Lokelma in the ED. Patient got hemodialysis.  Repeat K 5.6. Continue to monitor daily electrolytes.  COPD: No exacerbation Continue DuoNebs, home inhalers.  Hypothyroidism: Continue Synthroid therapy.  Obesity with BMI 32.42: Diet, exercise and weight reduction advised.  Active Pressure Injury/Wound(s)     Pressure Ulcer  Duration          Pressure Injury 12/12/21 Coccyx Stage 2 -  Partial thickness loss of dermis presenting as a shallow open injury with a red, pink wound bed without slough. 472 days           Out of bed to chair. Incentive spirometry. Nursing supportive care. Fall, aspiration precautions. DVT prophylaxis   Code Status: Full Code  Subjective: Patient is seen and examined today morning.  She is sitting in the chair, denies any complaints.  She refused to get MRI brain and does not think symptoms are due to a stroke.  Attributes her symptoms to hyperkalemia.  Asks for Imodium for loose stools  which she has every at least weekly once.  Denies any other complaints.  Physical Exam: Vitals:   03/28/23 2119 03/28/23 2143 03/29/23 0454 03/29/23 0910  BP: (!) 155/69 (!) 152/86 135/72 (!) 144/80  Pulse: 82 79 66 75  Resp: 18 18 18 16   Temp:  98.3 F (36.8 C) 98 F (36.7 C)   TempSrc:      SpO2: 100% 99% 100% 100%  Weight:      Height:        General -middle-aged African-American female, no apparent distress HEENT - PERRLA, EOMI, atraumatic head, non tender sinuses. Lung - Clear, diffuse rales, rhonchi, no wheezes. Heart - S1, S2 heard, no murmurs, rubs, trace pedal edema. Abdomen - Soft, non tender, bowel sounds good Neuro - Alert, awake and oriented x 3, non focal exam. Skin - Warm and dry.  Data Reviewed:      Latest Ref Rng & Units 03/28/2023    7:43 AM 02/11/2023   11:25 AM 05/03/2022    7:06 AM  CBC  WBC 4.0 - 10.5 K/uL 4.2  2.9  4.5   Hemoglobin 12.0 - 15.0 g/dL 9.6  40.1  02.7   Hematocrit 36.0 - 46.0 % 30.4  33.2  33.8   Platelets 150 - 400 K/uL 134  131  165       Latest Ref Rng & Units 03/29/2023    8:45 AM 03/28/2023    7:43 AM 05/03/2022    7:06 AM  BMP  Glucose 70 - 99 mg/dL 84  253  89   BUN 6 - 20 mg/dL 75  664  49  Creatinine 0.44 - 1.00 mg/dL 1.61  09.60  4.54   Sodium 135 - 145 mmol/L 138  142  143   Potassium 3.5 - 5.1 mmol/L 5.6  6.1  3.6   Chloride 98 - 111 mmol/L 97  103  104   CO2 22 - 32 mmol/L 27  20  23    Calcium 8.9 - 10.3 mg/dL 7.9  7.2  8.7    ECHOCARDIOGRAM COMPLETE  Result Date: 03/28/2023    ECHOCARDIOGRAM REPORT   Patient Name:   Tonya Myers Date of Exam: 03/28/2023 Medical Rec #:  [id number]       Height:       66.0 in Accession #:    [id number]      Weight:       175.0 lb Date of Birth:  January 14, 1966       BSA:          1.889 m Patient Age:    57 years        BP:           185/98 mmHg Patient Gender: F               HR:           60 bpm. Exam Location:  [facility] Procedure: 2D Echo, Cardiac Doppler and Color Doppler Indications:      Stroke I63.9  History:         Patient has prior history of Echocardiogram examinations, most                  recent 12/10/2021.  Sonographer:     Cristela Blue Referring Phys:  [id number] Emeline General Diagnosing Phys: Lorine Bears MD IMPRESSIONS  1. Left ventricular ejection fraction, by estimation, is 55 to 60%. The left ventricle has normal function. The left ventricle has no regional wall motion abnormalities. There is mild left ventricular hypertrophy. Left ventricular diastolic parameters are indeterminate.  2. Right ventricular systolic function is normal. The right ventricular size is normal. Tricuspid regurgitation signal is inadequate for assessing PA pressure.  3. Left atrial size was mildly dilated.  4. The mitral valve is normal in structure. Mild mitral valve regurgitation. No evidence of mitral stenosis.  5. The aortic valve is normal in structure. Aortic valve regurgitation is not visualized. No aortic stenosis is present. FINDINGS  Left Ventricle: Left ventricular ejection fraction, by estimation, is 55 to 60%. The left ventricle has normal function. The left ventricle has no regional wall motion abnormalities. The left ventricular internal cavity size was normal in size. There is  mild left ventricular hypertrophy. Left ventricular diastolic parameters are indeterminate. Right Ventricle: The right ventricular size is normal. No increase in right ventricular wall thickness. Right ventricular systolic function is normal. Tricuspid regurgitation signal is inadequate for assessing PA pressure. Left Atrium: Left atrial size was mildly dilated. Right Atrium: Right atrial size was normal in size. Pericardium: There is no evidence of pericardial effusion. Mitral Valve: The mitral valve is normal in structure. Mild mitral valve regurgitation. No evidence of mitral valve stenosis. Tricuspid Valve: The tricuspid valve is normal in structure. Tricuspid valve regurgitation is not demonstrated. No evidence of  tricuspid stenosis. Aortic Valve: The aortic valve is normal in structure. Aortic valve regurgitation is not visualized. No aortic stenosis is present. Aortic valve mean gradient measures 3.0 mmHg. Aortic valve peak gradient measures 6.2 mmHg. Aortic valve area, by VTI measures 3.18 cm. Pulmonic Valve: The pulmonic valve was normal in structure.  Pulmonic valve regurgitation is not visualized. No evidence of pulmonic stenosis. Aorta: The aortic root is normal in size and structure. Venous: The inferior vena cava was not well visualized. IAS/Shunts: No atrial level shunt detected by color flow Doppler.  LEFT VENTRICLE PLAX 2D LVIDd:         4.90 cm   Diastology LVIDs:         3.60 cm   LV e' medial:    7.07 cm/s LV PW:         1.30 cm   LV E/e' medial:  17.5 LV IVS:        1.30 cm   LV e' lateral:   12.50 cm/s LVOT diam:     2.10 cm   LV E/e' lateral: 9.9 LV SV:         89 LV SV Index:   47 LVOT Area:     3.46 cm  RIGHT VENTRICLE RV Basal diam:  2.90 cm RV Mid diam:    2.60 cm RV S prime:     13.80 cm/s TAPSE (M-mode): 2.7 cm LEFT ATRIUM             Index        RIGHT ATRIUM           Index LA diam:        4.80 cm 2.54 cm/m   RA Area:     10.60 cm LA Vol (A2C):   57.4 ml 30.38 ml/m  RA Volume:   21.10 ml  11.17 ml/m LA Vol (A4C):   33.5 ml 17.73 ml/m LA Biplane Vol: 47.5 ml 25.14 ml/m  AORTIC VALVE AV Area (Vmax):    2.68 cm AV Area (Vmean):   2.68 cm AV Area (VTI):     3.18 cm AV Vmax:           125.00 cm/s AV Vmean:          86.400 cm/s AV VTI:            0.280 m AV Peak Grad:      6.2 mmHg AV Mean Grad:      3.0 mmHg LVOT Vmax:         96.60 cm/s LVOT Vmean:        66.800 cm/s LVOT VTI:          0.257 m LVOT/AV VTI ratio: 0.92  AORTA Ao Root diam: 2.90 cm MITRAL VALVE                TRICUSPID VALVE MV Area (PHT): 3.83 cm     TR Peak grad:   25.2 mmHg MV Decel Time: 198 msec     TR Vmax:        251.00 cm/s MV E velocity: 124.00 cm/s MV A velocity: 78.60 cm/s   SHUNTS MV E/A ratio:  1.58         Systemic  VTI:  0.26 m                             Systemic Diam: 2.10 cm Lorine Bears MD Electronically signed by Lorine Bears MD Signature Date/Time: 03/28/2023/1:02:46 PM    Final    CT HEAD WO CONTRAST ( )  Result Date: 03/28/2023 CLINICAL DATA:  Weakness.  History of fall on Wednesday. EXAM: CT HEAD WITHOUT CONTRAST CT CERVICAL SPINE WITHOUT CONTRAST TECHNIQUE: Multidetector CT imaging of the head and cervical spine was performed following the standard  protocol without intravenous contrast. Multiplanar CT image reconstructions of the cervical spine were also generated. RADIATION DOSE REDUCTION: This exam was performed according to the departmental dose-optimization program which includes automated exposure control, adjustment of the mA and/or kV according to patient size and/or use of iterative reconstruction technique. COMPARISON:  Head CT 12/15/2021 FINDINGS: CT HEAD FINDINGS Brain: No evidence of acute infarction, hemorrhage, hydrocephalus, extra-axial collection or mass lesion/mass effect. Vascular: No hyperdense vessel or unexpected calcification. Skull: Normal. Negative for fracture or focal lesion. Sinuses/Orbits: No evidence of injury CT CERVICAL SPINE FINDINGS Alignment: Normal. Skull base and vertebrae: No acute fracture. No primary bone lesion or focal pathologic process. Soft tissues and spinal canal: No prevertebral fluid or swelling. No visible canal hematoma. Disc levels: Generalized disc space narrowing with endplate ridging and disc protrusions with buttressing osteophytes, especially seen at C3-4 and C4-5 where there is ventral cord contact. Upper chest: Negative IMPRESSION: 1. No evidence of acute intracranial or cervical spine injury. 2. 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 Electronically Signed   By: Tiburcio Pea M.D.   On: 03/28/2023 07:39   CT Cervical Spine Wo Contrast  Result Date: 03/28/2023 CLINICAL DATA:  Weakness.   History of fall on Wednesday. EXAM: CT HEAD WITHOUT CONTRAST CT CERVICAL SPINE WITHOUT CONTRAST TECHNIQUE: Multidetector CT imaging of the head and cervical spine was performed following the standard protocol without intravenous contrast. Multiplanar CT image reconstructions of the cervical spine were also generated. RADIATION DOSE REDUCTION: This exam was performed according to the departmental dose-optimization program which includes automated exposure control, adjustment of the mA and/or kV according to patient size and/or use of iterative reconstruction technique. COMPARISON:  Head CT 12/15/2021 FINDINGS: CT HEAD FINDINGS Brain: No evidence of acute infarction, hemorrhage, hydrocephalus, extra-axial collection or mass lesion/mass effect. Vascular: No hyperdense vessel or unexpected calcification. Skull: Normal. Negative for fracture or focal lesion. Sinuses/Orbits: No evidence of injury CT CERVICAL SPINE FINDINGS Alignment: Normal. Skull base and vertebrae: No acute fracture. No primary bone lesion or focal pathologic process. Soft tissues and spinal canal: No prevertebral fluid or swelling. No visible canal hematoma. Disc levels: Generalized disc space narrowing with endplate ridging and disc protrusions with buttressing osteophytes, especially seen at C3-4 and C4-5 where there is ventral cord contact. Upper chest: Negative IMPRESSION: 1. No evidence of acute intracranial or cervical spine injury. 2. 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 Electronically Signed   By: Tiburcio Pea M.D.   On: 03/28/2023 07:39     Family Communication: Discussed with patient, she understand and agree. All questions answereed.  Disposition: Status is: Inpatient Remains inpatient appropriate because: stroke work up, HD treatment.  Planned Discharge Destination: Home     Time spent: 36 minutes  Author: Marcelino Duster, MD 03/29/2023 1:45 PM Secure  chat 7am to 7pm For on call review [email or web address].

## 2023-03-29 NOTE — Evaluation (Signed)
Occupational Therapy Evaluation Patient Details Name: Tonya Myers MRN: [id number] DOB: [birth year] Today's Date: 03/29/2023   History of Present Illness Pt is a 57 y.o. female presenting to hospital 03/28/23 (Monday) with c/o weakness; recent fall on Wednesday without LOC; some stuttering and some R sided tingling going on since Friday.  Pt admitted with acute/subacute dysarthria, question of tardive dyskinesia, hyperkalemia, worsening of R hand/foot weakness.  PMH includes ESRD on HD MWF, COPD, cervical radiculopathy, lumbar spine radiculopathy, B thoracic laminectomy for epidural abscess, IIDM, anxiety/depression, hypothyroidism, CLL. vertigo.   Clinical Impression   Pt was seen for OT evaluation this date. Prior to hospital admission, pt was generally independent with ADL, driving, and mobility using SPC. Pt presents to acute OT demonstrating impaired ADLIADL performance 2/2 decreased strength and FMC to R hand>RUE subjectively worse than baseline RUE weakness (See OT problem list for additional functional deficits). Pt currently completes ADL mobility with RW with supervision, no direct assist required for ADL. Pt educated in [facility] handout to address decreased FMC and strength in R hand. Pt encouraged to visualize hand while completing tasks as well. Pt verbalized understanding, at times requiring VC to redirect to task as she asks if the therapist is done and if she can go back to bed. Pt would benefit from skilled OT services to address noted impairments and functional limitations (see below for any additional details) in order to maximize safety and independence while minimizing falls risk.     If plan is discharge home, recommend the following:  2WW    Functional Status Assessment  Patient has had a recent decline in their functional status and demonstrates the ability to make significant improvements in function in a reasonable and predictable amount of time.  Equipment Recommendations   None recommended by OT    Recommendations for Other Services       Precautions / Restrictions Precautions Precautions: Fall Precaution Comments: R subclavian perm-cath; R UE fistula Restrictions Weight Bearing Restrictions: No      Mobility Bed Mobility Overal bed mobility: Modified Independent Bed Mobility: Sit to Supine       Sit to supine: Modified independent (Device/Increase time)        Transfers Overall transfer level: Needs assistance Equipment used: Rolling walker (2 wheels) Transfers: Sit to/from Stand Sit to Stand: Supervision                  Balance Overall balance assessment: Needs assistance Sitting-balance support: No upper extremity supported, Feet supported Sitting balance-Leahy Scale: Normal     Standing balance support: Bilateral upper extremity supported, During functional activity, Reliant on assistive device for balance Standing balance-Leahy Scale: Good                             ADL either performed or assessed with clinical judgement   ADL Overall ADL's : Modified independent                                             Vision         Perception         Praxis         Pertinent Vitals/Pain Pain Assessment Pain Assessment: No/denies pain     Extremity/Trunk Assessment Upper Extremity Assessment Upper Extremity Assessment: RUE deficits/detail RUE Deficits / Details: grossly 4- to  4/5, FMC deficits, not new but pt notes that she has been dropping items more with the R hand and that's new RUE Coordination: decreased fine motor   Lower Extremity Assessment Lower Extremity Assessment: Defer to PT evaluation RLE Deficits / Details: hip flexion 4-/5; knee flexion 4/5; knee extension 4/5; DF 2+/5   Cervical / Trunk Assessment Cervical / Trunk Assessment: Normal   Communication Communication Communication: No apparent difficulties Cueing Techniques: Verbal cues   Cognition Arousal:  Alert Behavior During Therapy: WFL for tasks assessed/performed Overall Cognitive Status: Within Functional Limits for tasks assessed                                       General Comments       Exercises Other Exercises Other Exercises: Pt educated in [facility] handout to address decreased FMC and strength in R hand. Pt encouraged to visualize hand while completing tasks as well. Pt verbalized understanding, at times requiring VC to redirect to task as she asks if the therapist is done and if she can go back to bed.   Shoulder Instructions      Home Living Family/patient expects to be discharged to:: Private residence Living Arrangements: Alone Available Help at Discharge: Family;Available PRN/intermittently Type of Home: House Home Access: Stairs to enter [employer] of Steps: 2 Entrance Stairs-Rails: None Home Layout: Two level Alternate Level Stairs-Number of Steps: flight to 2nd floor bedroom Alternate Level Stairs-Rails: Left Bathroom Shower/Tub: Producer, television/film/video: Handicapped height     Home Equipment: Grab bars - tub/shower;Cane - single point          Prior Functioning/Environment Prior Level of Function : Independent/Modified Independent;Driving             Mobility Comments: Modified independent ambulating with SPC. ADLs Comments: indep with ADL and driving, doesn't work        OT Problem List: Decreased strength;Decreased coordination;Impaired UE functional use      OT Treatment/Interventions:      OT Goals(Current goals can be found in the care plan section) Acute Rehab OT Goals Patient Stated Goal: get my hand better OT Goal Formulation: With patient Time For Goal Achievement: 04/12/23 Potential to Achieve Goals: Good ADL Goals Additional ADL Goal #1: Pt will independently complete learned [facility] activities for RUE with handout for reference. Additional ADL Goal #2: Pt will complete 5 ADL tasks with <2  episodes of dropping items using R hand.  OT Frequency:      Co-evaluation              AM-PAC OT "6 Clicks" Daily Activity     Outcome Measure Help from another person eating meals?: None Help from another person taking care of personal grooming?: None Help from another person toileting, which includes using toliet, bedpan, or urinal?: None Help from another person bathing (including washing, rinsing, drying)?: None Help from another person to put on and taking off regular upper body clothing?: None Help from another person to put on and taking off regular lower body clothing?: None 6 Click Score: 24   End of Session Equipment Utilized During Treatment: Rolling walker (2 wheels)  Activity Tolerance: Patient tolerated treatment well Patient left: in bed;with call bell/phone within reach;with bed alarm set  OT Visit Diagnosis: Other abnormalities of gait and mobility (R26.89)  Time: 0102-7253 OT Time Calculation (min): 17 min Charges:  OT General Charges $OT Visit: 1 Visit OT Evaluation $OT Eval Low Complexity: 1 Low OT Treatments $Therapeutic Activity: 8-22 mins  Arman Filter., MPH, MS, OTR/L ascom [phone] 03/29/23, 12:58 PM

## 2023-03-29 NOTE — Plan of Care (Signed)
  Problem: Self-Care: Goal: Ability to communicate needs accurately will improve Outcome: Progressing   

## 2023-03-29 NOTE — Evaluation (Signed)
Physical Therapy Evaluation Patient Details Name: Tonya Myers MRN: [id number] DOB: 03/27/1966 Today's Date: 03/29/2023  History of Present Illness  Pt is a 57 y.o. female presenting to hospital 03/28/23 (Monday) with c/o weakness; recent fall on Wednesday without LOC; some stuttering and some R sided tingling going on since Friday.  Pt admitted with acute/subacute dysarthria, question of tardive dyskinesia, hyperkalemia, worsening of R hand/foot weakness.  PMH includes ESRD on HD MWF, COPD, cervical radiculopathy, lumbar spine radiculopathy, B thoracic laminectomy for epidural abscess, IIDM, anxiety/depression, hypothyroidism, CLL. vertigo.  Clinical Impression  Prior to recent medical concerns, pt was modified independent ambulating with SPC; lives alone in 2 level home.  Currently pt is modified independent semi-supine to sitting EOB; SBA with transfers; and CGA to ambulate 100 feet with RW use.  Pt appeared to tolerate activities during session well (HR in mid 80's bpm with activity).  R UE/LE weakness noted (pt reports chronic for the past 2 years).  Pt also reports she has been dropping items with her R hand which is new (OT notified).  Pt would currently benefit from skilled PT to address noted impairments and functional limitations (see below for any additional details).  Upon hospital discharge, pt would benefit from ongoing therapy.     If plan is discharge home, recommend the following: A little help with walking and/or transfers;A little help with bathing/dressing/bathroom;Assistance with cooking/housework;Assist for transportation;Help with stairs or ramp for entrance   Can travel by private vehicle    Yes    Equipment Recommendations Rolling walker (2 wheels)  Recommendations for Other Services       Functional Status Assessment Patient has had a recent decline in their functional status and demonstrates the ability to make significant improvements in function in a reasonable and  predictable amount of time.     Precautions / Restrictions Precautions Precautions: Fall Precaution Comments: R subclavian perm-cath; R UE fistula Restrictions Weight Bearing Restrictions: No      Mobility  Bed Mobility Overal bed mobility: Modified Independent Bed Mobility: Supine to Sit     Supine to sit: Modified independent (Device/Increase time)     General bed mobility comments: No difficulties noted    Transfers Overall transfer level: Needs assistance Equipment used: Rolling walker (2 wheels) Transfers: Sit to/from Stand Sit to Stand: Supervision           General transfer comment: fairly strong stand up to RW    Ambulation/Gait Ambulation/Gait assistance: Contact guard assist Gait Distance (Feet): 100 Feet Assistive device: Rolling walker (2 wheels) Gait Pattern/deviations: Step-through pattern, Decreased step length - right, Decreased step length - left Gait velocity: mildly decreased     General Gait Details: steady ambulating with RW use  Stairs            Wheelchair Mobility     Tilt Bed    Modified Rankin (Stroke Patients Only)       Balance Overall balance assessment: Needs assistance Sitting-balance support: No upper extremity supported, Feet supported Sitting balance-Leahy Scale: Normal Sitting balance - Comments: steady reaching outside BOS   Standing balance support: Bilateral upper extremity supported, During functional activity, Reliant on assistive device for balance Standing balance-Leahy Scale: Good Standing balance comment: steady ambulating with RW use                             Pertinent Vitals/Pain Pain Assessment Pain Assessment: No/denies pain Vitals (HR and SpO2 on room air)  stable and WFL throughout treatment session.    Home Living Family/patient expects to be discharged to:: Private residence Living Arrangements: Alone Available Help at Discharge: Family;Available PRN/intermittently Type  of Home: House Home Access: Stairs to enter Entrance Stairs-Rails: None Entrance Stairs-Number of Steps: 2 Alternate Level Stairs-Number of Steps: flight to 2nd floor bedroom Home Layout: Two level Home Equipment: Grab bars - tub/shower;Cane - single point      Prior Function Prior Level of Function : Independent/Modified Independent             Mobility Comments: Modified independent ambulating with SPC.       Extremity/Trunk Assessment   Upper Extremity Assessment Upper Extremity Assessment: Defer to OT evaluation (R UE weakness noted (pt reports chronic for past 2 years))    Lower Extremity Assessment Lower Extremity Assessment: RLE deficits/detail (L LE WFL) RLE Deficits / Details: hip flexion 4-/5; knee flexion 4/5; knee extension 4/5; DF 2+/5    Cervical / Trunk Assessment Cervical / Trunk Assessment: Normal  Communication   Communication Communication: No apparent difficulties Cueing Techniques: Verbal cues  Cognition Arousal: Alert Behavior During Therapy: WFL for tasks assessed/performed Overall Cognitive Status: Within Functional Limits for tasks assessed                                          General Comments  Discussed pt's elevated K+ yesterday (no new lab draws for K+ noted today yet but pt treated for it yesterday) with MD Sreeram who cleared pt for therapy participation.  Pt agreeable to PT evaluation.  Pt reports trying to have PT in the past but has not worked well d/t dialysis schedule and kidney transplant work-up appts.    Exercises     Assessment/Plan    PT Assessment Patient needs continued PT services  PT Problem List Decreased strength;Decreased activity tolerance;Decreased balance;Decreased mobility       PT Treatment Interventions DME instruction;Gait training;Stair training;Functional mobility training;Therapeutic activities;Therapeutic exercise;Balance training;Patient/family education    PT Goals (Current goals  can be found in the Care Plan section)  Acute Rehab PT Goals Patient Stated Goal: to improve strength and mobility PT Goal Formulation: With patient Time For Goal Achievement: 04/12/23 Potential to Achieve Goals: Good    Frequency Min 1X/week     Co-evaluation               AM-PAC PT "6 Clicks" Mobility  Outcome Measure Help needed turning from your back to your side while in a flat bed without using bedrails?: None Help needed moving from lying on your back to sitting on the side of a flat bed without using bedrails?: None Help needed moving to and from a bed to a chair (including a wheelchair)?: A Little Help needed standing up from a chair using your arms (e.g., wheelchair or bedside chair)?: A Little Help needed to walk in hospital room?: A Little Help needed climbing 3-5 steps with a railing? : A Little 6 Click Score: 20    End of Session Equipment Utilized During Treatment: Gait belt Activity Tolerance: Patient tolerated treatment well Patient left: in chair;with call bell/phone within reach;with chair alarm set Nurse Communication: Mobility status;Precautions (via white board) PT Visit Diagnosis: Other abnormalities of gait and mobility (R26.89);Muscle weakness (generalized) (M62.81)    Time: 1191-4782 PT Time Calculation (min) (ACUTE ONLY): 24 min   Charges:   PT Evaluation $PT  Eval Low Complexity: 1 Low PT Treatments $Therapeutic Activity: 8-22 mins PT General Charges $$ ACUTE PT VISIT: 1 Visit        Hendricks Limes, PT 03/29/23, 12:05 PM

## 2023-03-29 NOTE — Progress Notes (Signed)
[facility]  ROUNDING NOTE   Subjective:   Tonya Myers is a 57 year old female with past medical history including depression, type 2 diabetes, asthma, hypertension, hypothyroidism, CLL, and end-stage renal disease on hemodialysis.  She presents to the emergency department with complaints of weakness and has been admitted for Hyperkalemia [E87.5] Weakness [R53.1] Stroke (HCC) [I63.9] Elevated BUN [R79.9] ESRD (end stage renal disease) on dialysis (HCC) [N18.6, Z99.2]  Patient is known our practice and receives outpatient dialysis treatments at [facility] on a MWF schedule, supervised by [facility] physicians.    Patient seen resting in bed Alert and oriented States she feels well today, back to baseline Able to get out of bed this morning, reports weakness has greatly improved Room air  Potassium 5.6  Objective:  Vital signs in last 24 hours:  Temp:  [98 F (36.7 C)-98.3 F (36.8 C)] 98 F (36.7 C) (12/03 0454) Pulse Rate:  [50-82] 75 (12/03 0910) Resp:  [10-28] 16 (12/03 0910) BP: (120-177)/(69-103) 144/80 (12/03 0910) SpO2:  [95 %-100 %] 100 % (12/03 0910) Weight:  [91.1 kg-93 kg] 91.1 kg (12/02 1726)  Weight change:  Filed Weights   03/28/23 0712 03/28/23 1313 03/28/23 1726  Weight: 79.4 kg 93 kg 91.1 kg    Intake/Output: I/O last 3 completed shifts: In: -  Out: 2000 [Other:2000]   Intake/Output this shift:  No intake/output data recorded.  Physical Exam: General: NAD  Head: Normocephalic, atraumatic. Moist oral mucosal membranes  Eyes: Anicteric  Lungs:  Clear to auscultation, normal effort, room air  Heart: Regular rate and rhythm  Abdomen:  Soft, nontender, nondistended  Extremities: No peripheral edema.  Neurologic: Alert and oriented, moving all four extremities  Skin: No lesions  Access: Right AVF    Basic Metabolic Panel: Recent Labs  Lab 03/28/23 0743 03/29/23 0845  NA 142 138  K 6.1* 5.6*  CL 103 97*  CO2 20* 27   GLUCOSE 159* 84  BUN 121* 75*  CREATININE 12.50* 8.40*  CALCIUM 7.2* 7.9*  PHOS  --  5.8*    Liver Function Tests: Recent Labs  Lab 03/29/23 0845  ALBUMIN 3.5   No results for input(s): "LIPASE", "AMYLASE" in the last 168 hours. No results for input(s): "AMMONIA" in the last 168 hours.  CBC: Recent Labs  Lab 03/28/23 0743  WBC 4.2  HGB 9.6*  HCT 30.4*  MCV 91.6  PLT 134*    Cardiac Enzymes: No results for input(s): "CKTOTAL", "CKMB", "CKMBINDEX", "TROPONINI" in the last 168 hours.  BNP: Invalid input(s): "POCBNP"  CBG: Recent Labs  Lab 03/28/23 2156  GLUCAP 155*    Microbiology: Results for orders placed or performed during the hospital encounter of 05/03/22  Resp panel by RT-PCR (RSV, Flu A&B, Covid) Anterior Nasal Swab     Status: None   Collection Time: 05/03/22  7:05 AM   Specimen: Anterior Nasal Swab  Result Value Ref Range Status   SARS Coronavirus 2 by RT PCR NEGATIVE NEGATIVE Final    Comment: (NOTE) SARS-CoV-2 target nucleic acids are NOT DETECTED.  The SARS-CoV-2 RNA is generally detectable in upper respiratory specimens during the acute phase of infection. The lowest concentration of SARS-CoV-2 viral copies this assay can detect is 138 copies/mL. A negative result does not preclude SARS-Cov-2 infection and should not be used as the sole basis for treatment or other patient management decisions. A negative result may occur with  improper specimen collection/handling, submission of specimen other than nasopharyngeal swab, presence of viral mutation(s)  within the areas targeted by this assay, and inadequate number of viral copies(<138 copies/mL). A negative result must be combined with clinical observations, patient history, and epidemiological information. The expected result is Negative.  Fact Sheet for Patients:  BloggerCourse.com  Fact Sheet for Healthcare Providers:   SeriousBroker.it  This test is no t yet approved or cleared by the [place] FDA and  has been authorized for detection and/or diagnosis of SARS-CoV-2 by FDA under an Emergency Use Authorization (EUA). This EUA will remain  in effect (meaning this test can be used) for the duration of the COVID-19 declaration under Section 564(b)(1) of the Act, 21 U.S.C.section 360bbb-3(b)(1), unless the authorization is terminated  or revoked sooner.       Influenza A by PCR NEGATIVE NEGATIVE Final   Influenza B by PCR NEGATIVE NEGATIVE Final    Comment: (NOTE) The Xpert Xpress SARS-CoV-2/FLU/RSV plus assay is intended as an aid in the diagnosis of influenza from Nasopharyngeal swab specimens and should not be used as a sole basis for treatment. Nasal washings and aspirates are unacceptable for Xpert Xpress SARS-CoV-2/FLU/RSV testing.  Fact Sheet for Patients: BloggerCourse.com  Fact Sheet for Healthcare Providers: SeriousBroker.it  This test is not yet approved or cleared by the [place] FDA and has been authorized for detection and/or diagnosis of SARS-CoV-2 by FDA under an Emergency Use Authorization (EUA). This EUA will remain in effect (meaning this test can be used) for the duration of the COVID-19 declaration under Section 564(b)(1) of the Act, 21 U.S.C. section 360bbb-3(b)(1), unless the authorization is terminated or revoked.     Resp Syncytial Virus by PCR NEGATIVE NEGATIVE Final    Comment: (NOTE) Fact Sheet for Patients: BloggerCourse.com  Fact Sheet for Healthcare Providers: SeriousBroker.it  This test is not yet approved or cleared by the [place] FDA and has been authorized for detection and/or diagnosis of SARS-CoV-2 by FDA under an Emergency Use Authorization (EUA). This EUA will remain in effect (meaning this test can be used) for  the duration of the COVID-19 declaration under Section 564(b)(1) of the Act, 21 U.S.C. section 360bbb-3(b)(1), unless the authorization is terminated or revoked.  Performed at [facility], [address] [place], [place] [address]   C Difficile Quick Screen w PCR reflex     Status: None   Collection Time: 05/03/22  9:24 AM   Specimen: STOOL  Result Value Ref Range Status   C Diff antigen NEGATIVE NEGATIVE Final   C Diff toxin NEGATIVE NEGATIVE Final   C Diff interpretation No C. difficile detected.  Final    Comment: Performed at [facility], [address] [place], [place] [address]  Gastrointestinal Panel by PCR , Stool     Status: Abnormal   Collection Time: 05/03/22  9:24 AM   Specimen: STOOL  Result Value Ref Range Status   Campylobacter species NOT DETECTED NOT DETECTED Final   Plesimonas shigelloides NOT DETECTED NOT DETECTED Final   Salmonella species NOT DETECTED NOT DETECTED Final   Yersinia enterocolitica NOT DETECTED NOT DETECTED Final   Vibrio species NOT DETECTED NOT DETECTED Final   Vibrio cholerae NOT DETECTED NOT DETECTED Final   Enteroaggregative E coli (EAEC) NOT DETECTED NOT DETECTED Final   Enteropathogenic E coli (EPEC) NOT DETECTED NOT DETECTED Final   Enterotoxigenic E coli (ETEC) NOT DETECTED NOT DETECTED Final   Shiga like toxin producing E coli (STEC) NOT DETECTED NOT DETECTED Final   Shigella/Enteroinvasive E coli (EIEC) NOT DETECTED NOT DETECTED  Final   Cryptosporidium NOT DETECTED NOT DETECTED Final   Cyclospora cayetanensis NOT DETECTED NOT DETECTED Final   Entamoeba histolytica NOT DETECTED NOT DETECTED Final   Giardia lamblia NOT DETECTED NOT DETECTED Final   Adenovirus F40/41 DETECTED (A) NOT DETECTED Final   Astrovirus NOT DETECTED NOT DETECTED Final   Norovirus GI/GII NOT DETECTED NOT DETECTED Final   Rotavirus A NOT DETECTED NOT DETECTED Final   Sapovirus (I, II, IV, and V) NOT DETECTED NOT DETECTED Final    Comment:  Performed at [facility], [address] [place], [place] [address]    Coagulation Studies: No results for input(s): "LABPROT", "INR" in the last 72 hours.  Urinalysis: Recent Labs    03/28/23 1216  COLORURINE YELLOW*  LABSPEC 1.009  PHURINE 9.0*  GLUCOSEU 50*  HGBUR NEGATIVE  BILIRUBINUR NEGATIVE  KETONESUR NEGATIVE  PROTEINUR 30*  NITRITE NEGATIVE  LEUKOCYTESUR MODERATE*      Imaging: ECHOCARDIOGRAM COMPLETE  Result Date: 03/28/2023    ECHOCARDIOGRAM REPORT   Patient Name:   TARRIE ZALUSKI Date of Exam: 03/28/2023 Medical Rec #:  [id number]       Height:       66.0 in Accession #:    [id number]      Weight:       175.0 lb Date of Birth:  1965-04-30       BSA:          1.889 m Patient Age:    57 years        BP:           185/98 mmHg Patient Gender: F               HR:           60 bpm. Exam Location:  [facility] Procedure: 2D Echo, Cardiac Doppler and Color Doppler Indications:     Stroke I63.9  History:         Patient has prior history of Echocardiogram examinations, most                  recent 12/10/2021.  Sonographer:     Cristela Blue Referring Phys:  [id number] Emeline General Diagnosing Phys: Lorine Bears MD IMPRESSIONS  1. Left ventricular ejection fraction, by estimation, is 55 to 60%. The left ventricle has normal function. The left ventricle has no regional wall motion abnormalities. There is mild left ventricular hypertrophy. Left ventricular diastolic parameters are indeterminate.  2. Right ventricular systolic function is normal. The right ventricular size is normal. Tricuspid regurgitation signal is inadequate for assessing PA pressure.  3. Left atrial size was mildly dilated.  4. The mitral valve is normal in structure. Mild mitral valve regurgitation. No evidence of mitral stenosis.  5. The aortic valve is normal in structure. Aortic valve regurgitation is not visualized. No aortic stenosis is present. FINDINGS  Left Ventricle: Left ventricular ejection fraction, by  estimation, is 55 to 60%. The left ventricle has normal function. The left ventricle has no regional wall motion abnormalities. The left ventricular internal cavity size was normal in size. There is  mild left ventricular hypertrophy. Left ventricular diastolic parameters are indeterminate. Right Ventricle: The right ventricular size is normal. No increase in right ventricular wall thickness. Right ventricular systolic function is normal. Tricuspid regurgitation signal is inadequate for assessing PA pressure. Left Atrium: Left atrial size was mildly dilated. Right Atrium: Right atrial size was normal in size. Pericardium: There is no evidence of pericardial effusion. Mitral Valve:  The mitral valve is normal in structure. Mild mitral valve regurgitation. No evidence of mitral valve stenosis. Tricuspid Valve: The tricuspid valve is normal in structure. Tricuspid valve regurgitation is not demonstrated. No evidence of tricuspid stenosis. Aortic Valve: The aortic valve is normal in structure. Aortic valve regurgitation is not visualized. No aortic stenosis is present. Aortic valve mean gradient measures 3.0 mmHg. Aortic valve peak gradient measures 6.2 mmHg. Aortic valve area, by VTI measures 3.18 cm. Pulmonic Valve: The pulmonic valve was normal in structure. Pulmonic valve regurgitation is not visualized. No evidence of pulmonic stenosis. Aorta: The aortic root is normal in size and structure. Venous: The inferior vena cava was not well visualized. IAS/Shunts: No atrial level shunt detected by color flow Doppler.  LEFT VENTRICLE PLAX 2D LVIDd:         4.90 cm   Diastology LVIDs:         3.60 cm   LV e' medial:    7.07 cm/s LV PW:         1.30 cm   LV E/e' medial:  17.5 LV IVS:        1.30 cm   LV e' lateral:   12.50 cm/s LVOT diam:     2.10 cm   LV E/e' lateral: 9.9 LV SV:         89 LV SV Index:   47 LVOT Area:     3.46 cm  RIGHT VENTRICLE RV Basal diam:  2.90 cm RV Mid diam:    2.60 cm RV S prime:     13.80 cm/s  TAPSE (M-mode): 2.7 cm LEFT ATRIUM             Index        RIGHT ATRIUM           Index LA diam:        4.80 cm 2.54 cm/m   RA Area:     10.60 cm LA Vol (A2C):   57.4 ml 30.38 ml/m  RA Volume:   21.10 ml  11.17 ml/m LA Vol (A4C):   33.5 ml 17.73 ml/m LA Biplane Vol: 47.5 ml 25.14 ml/m  AORTIC VALVE AV Area (Vmax):    2.68 cm AV Area (Vmean):   2.68 cm AV Area (VTI):     3.18 cm AV Vmax:           125.00 cm/s AV Vmean:          86.400 cm/s AV VTI:            0.280 m AV Peak Grad:      6.2 mmHg AV Mean Grad:      3.0 mmHg LVOT Vmax:         96.60 cm/s LVOT Vmean:        66.800 cm/s LVOT VTI:          0.257 m LVOT/AV VTI ratio: 0.92  AORTA Ao Root diam: 2.90 cm MITRAL VALVE                TRICUSPID VALVE MV Area (PHT): 3.83 cm     TR Peak grad:   25.2 mmHg MV Decel Time: 198 msec     TR Vmax:        251.00 cm/s MV E velocity: 124.00 cm/s MV A velocity: 78.60 cm/s   SHUNTS MV E/A ratio:  1.58         Systemic VTI:  0.26 m  Systemic Diam: 2.10 cm Lorine Bears MD Electronically signed by Lorine Bears MD Signature Date/Time: 03/28/2023/1:02:46 PM    Final    CT HEAD WO CONTRAST ( )  Result Date: 03/28/2023 CLINICAL DATA:  Weakness.  History of fall on Wednesday. EXAM: CT HEAD WITHOUT CONTRAST CT CERVICAL SPINE WITHOUT CONTRAST TECHNIQUE: Multidetector CT imaging of the head and cervical spine was performed following the standard protocol without intravenous contrast. Multiplanar CT image reconstructions of the cervical spine were also generated. RADIATION DOSE REDUCTION: This exam was performed according to the departmental dose-optimization program which includes automated exposure control, adjustment of the mA and/or kV according to patient size and/or use of iterative reconstruction technique. COMPARISON:  Head CT 12/15/2021 FINDINGS: CT HEAD FINDINGS Brain: No evidence of acute infarction, hemorrhage, hydrocephalus, extra-axial collection or mass lesion/mass effect.  Vascular: No hyperdense vessel or unexpected calcification. Skull: Normal. Negative for fracture or focal lesion. Sinuses/Orbits: No evidence of injury CT CERVICAL SPINE FINDINGS Alignment: Normal. Skull base and vertebrae: No acute fracture. No primary bone lesion or focal pathologic process. Soft tissues and spinal canal: No prevertebral fluid or swelling. No visible canal hematoma. Disc levels: Generalized disc space narrowing with endplate ridging and disc protrusions with buttressing osteophytes, especially seen at C3-4 and C4-5 where there is ventral cord contact. Upper chest: Negative IMPRESSION: 1. No evidence of acute intracranial or cervical spine injury. 2. 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 Electronically Signed   By: Tiburcio Pea M.D.   On: 03/28/2023 07:39   CT Cervical Spine Wo Contrast  Result Date: 03/28/2023 CLINICAL DATA:  Weakness.  History of fall on Wednesday. EXAM: CT HEAD WITHOUT CONTRAST CT CERVICAL SPINE WITHOUT CONTRAST TECHNIQUE: Multidetector CT imaging of the head and cervical spine was performed following the standard protocol without intravenous contrast. Multiplanar CT image reconstructions of the cervical spine were also generated. RADIATION DOSE REDUCTION: This exam was performed according to the departmental dose-optimization program which includes automated exposure control, adjustment of the mA and/or kV according to patient size and/or use of iterative reconstruction technique. COMPARISON:  Head CT 12/15/2021 FINDINGS: CT HEAD FINDINGS Brain: No evidence of acute infarction, hemorrhage, hydrocephalus, extra-axial collection or mass lesion/mass effect. Vascular: No hyperdense vessel or unexpected calcification. Skull: Normal. Negative for fracture or focal lesion. Sinuses/Orbits: No evidence of injury CT CERVICAL SPINE FINDINGS Alignment: Normal. Skull base and vertebrae: No acute fracture. No primary bone  lesion or focal pathologic process. Soft tissues and spinal canal: No prevertebral fluid or swelling. No visible canal hematoma. Disc levels: Generalized disc space narrowing with endplate ridging and disc protrusions with buttressing osteophytes, especially seen at C3-4 and C4-5 where there is ventral cord contact. Upper chest: Negative IMPRESSION: 1. No evidence of acute intracranial or cervical spine injury. 2. 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 Electronically Signed   By: Tiburcio Pea M.D.   On: 03/28/2023 07:39     Medications:      amitriptyline  10 mg Oral QHS   aspirin EC  81 mg Oral Daily   atorvastatin  20 mg Oral QHS   Chlorhexidine Gluconate Cloth  6 each Topical Q0600   insulin aspart  5 Units Intravenous Once   And   dextrose  1 ampule Intravenous Once   fluticasone  1 spray Each Nare BID   gabapentin  300 mg Oral QHS   heparin  5,000 Units Subcutaneous Q12H  levothyroxine  125 mcg Oral Q0600   loratadine  10 mg Oral Daily   senna-docusate  2 tablet Oral QHS   sevelamer carbonate  800 mg Oral TID WC   sodium bicarbonate  50 mEq Intravenous Once   sodium zirconium cyclosilicate  10 g Oral Once   acetaminophen, albuterol, LORazepam, ondansetron, senna-docusate  Assessment/ Plan:  Ms. Foye Lorence is a 57 y.o.  female with past medical history including depression, type 2 diabetes, asthma, hypertension, hypothyroidism, CLL, and end-stage renal disease on hemodialysis.  She presents to the emergency department with complaints of weakness and has been admitted for Hyperkalemia [E87.5] Weakness [R53.1] Stroke (HCC) [I63.9] Elevated BUN [R79.9] ESRD (end stage renal disease) on dialysis (HCC) [N18.6, Z99.2]  [facility]/MWF/right AVF/89.1 kg  End-stage renal disease with hyperkalemia on hemodialysis.  Last treatment completed on Tuesday.  Potassium 6.1 on ED arrival.  Urgent dialysis received  yesterday.  Potassium 5.6 today.  Patient placed on renal diet and ordered Lokelma 10 g.  Next treatment scheduled for Wednesday.  2. Anemia of chronic kidney disease Lab Results  Component Value Date   HGB 9.6 (L) 03/28/2023    Hemoglobin at goal.  Patient receives Mircera at outpatient clinic.  Will continue to monitor for need of ESA.  3. Secondary Hyperparathyroidism: with outpatient labs: PTH 1325, phosphorus 6.6, calcium 7.8 on 02/28/23.   Lab Results  Component Value Date   CALCIUM 7.9 (L) 03/29/2023   PHOS 5.8 (H) 03/29/2023    Prescribed calcitriol and renvela outpatient.  Calcium acceptable with slightly elevated phosphorus.  Will continue sevelamer with meals.  4. 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 Home regimen includes Humalog. Most recent hemoglobin A1c is 4.9 on 12/09/21.     LOS: 1 [place] 12/3/20241:09 PM

## 2023-03-30 DIAGNOSIS — N186 End stage renal disease: Secondary | ICD-10-CM

## 2023-03-30 DIAGNOSIS — E875 Hyperkalemia: Secondary | ICD-10-CM | POA: Diagnosis not present

## 2023-03-30 DIAGNOSIS — Z992 Dependence on renal dialysis: Secondary | ICD-10-CM

## 2023-03-30 DIAGNOSIS — R531 Weakness: Secondary | ICD-10-CM

## 2023-03-30 DIAGNOSIS — R799 Abnormal finding of blood chemistry, unspecified: Secondary | ICD-10-CM

## 2023-03-30 LAB — RENAL FUNCTION PANEL
Albumin: 3.2 g/dL — ABNORMAL LOW (ref 3.5–5.0)
Anion gap: 15 (ref 5–15)
BUN: 99 mg/dL — ABNORMAL HIGH (ref 6–20)
CO2: 21 mmol/L — ABNORMAL LOW (ref 22–32)
Calcium: 7.2 mg/dL — ABNORMAL LOW (ref 8.9–10.3)
Chloride: 100 mmol/L (ref 98–111)
Creatinine, Ser: 9.99 mg/dL — ABNORMAL HIGH (ref 0.44–1.00)
GFR, Estimated: 4 mL/min — ABNORMAL LOW (ref >60)
Glucose, Bld: 83 mg/dL (ref 70–99)
Phosphorus: 7.4 mg/dL — ABNORMAL HIGH (ref 2.5–4.6)
Potassium: 6.3 mmol/L (ref 3.5–5.1)
Sodium: 136 mmol/L (ref 135–145)

## 2023-03-30 LAB — CBC
HCT: 29.4 % — ABNORMAL LOW (ref 36.0–46.0)
Hemoglobin: 9.5 g/dL — ABNORMAL LOW (ref 12.0–15.0)
MCH: 28.9 pg (ref 26.0–34.0)
MCHC: 32.3 g/dL (ref 30.0–36.0)
MCV: 89.4 fL (ref 80.0–100.0)
Platelets: 122 10*3/uL — ABNORMAL LOW (ref 150–400)
RBC: 3.29 MIL/uL — ABNORMAL LOW (ref 3.87–5.11)
RDW: 17.2 % — ABNORMAL HIGH (ref 11.5–15.5)
WBC: 2.9 10*3/uL — ABNORMAL LOW (ref 4.0–10.5)
nRBC: 0 % (ref 0.0–0.2)

## 2023-03-30 LAB — POTASSIUM: Potassium: 4 mmol/L (ref 3.5–5.1)

## 2023-03-30 MED ORDER — PENTAFLUOROPROP-TETRAFLUOROETH EX AERO
INHALATION_SPRAY | CUTANEOUS | Status: AC
  Filled 2023-03-30: qty 30

## 2023-03-30 MED ORDER — HEPARIN SODIUM (PORCINE) 1000 UNIT/ML IJ SOLN
INTRAMUSCULAR | Status: AC
Start: 2023-03-30 — End: ?
  Filled 2023-03-30: qty 10

## 2023-03-30 NOTE — Progress Notes (Signed)
PT Cancellation Note  Patient Details Name: Tonya Myers MRN: [id number] DOB: 05-Apr-1966   Cancelled Treatment:    Reason Eval/Treat Not Completed: Patient at procedure or test/unavailable Patient is at HD. Will re-attempt when available.  Gabriel Conry 03/30/2023, 10:31 AM

## 2023-03-30 NOTE — Progress Notes (Signed)
Hemodialysis note  Received patient in bed to unit. Alert and oriented.  Informed consent signed and in chart.  Treatment initiated: 0924 Treatment completed: 1240  Patient tolerated well. Transported back to room, alert without acute distress.  Report given to patient's RN.   Access used: LUA AVG Access issues: none  Total UF removed: 2L Medication(s) given:  none  Post HD weight: 91.5 kg   Wolfgang Phoenix [facility]

## 2023-03-30 NOTE — Progress Notes (Signed)
[facility]  ROUNDING NOTE   Subjective:   Tonya Myers is a 57 year old female with past medical history including depression, type 2 diabetes, asthma, hypertension, hypothyroidism, CLL, and end-stage renal disease on hemodialysis.  She presents to the emergency department with complaints of weakness and has been admitted for Hyperkalemia [E87.5] Weakness [R53.1] Stroke (HCC) [I63.9] Elevated BUN [R79.9] ESRD (end stage renal disease) on dialysis (HCC) [N18.6, Z99.2]  Patient is known our practice and receives outpatient dialysis treatments at [facility] on a MWF schedule, supervised by [facility] physicians.    Patient seen and evaluated during the dialysis   HEMODIALYSIS FLOWSHEET:  Blood Flow Rate (mL/min): 349 mL/min Arterial Pressure (mmHg): -237.56 mmHg Venous Pressure (mmHg): 224.84 mmHg TMP (mmHg): 19.19 mmHg Ultrafiltration Rate (mL/min): 933 mL/min Dialysate Flow Rate (mL/min): 299 ml/min  States she feels well.  Tolerating meals. Awaiting results for brain MRI   Objective:  Vital signs in last 24 hours:  Temp:  [97.8 F (36.6 C)-99 F (37.2 C)] 98.4 F (36.9 C) (12/04 0842) Pulse Rate:  [61-81] 75 (12/04 1100) Resp:  [10-21] 13 (12/04 1100) BP: (129-175)/(63-81) 133/73 (12/04 1100) SpO2:  [89 %-100 %] 97 % (12/04 1100) Weight:  [92.7 kg-95 kg] 92.7 kg (12/04 0842)  Weight change: 15.6 kg Filed Weights   03/28/23 1726 03/30/23 0425 03/30/23 0842  Weight: 91.1 kg 95 kg 92.7 kg    Intake/Output: No intake/output data recorded.   Intake/Output this shift:  No intake/output data recorded.  Physical Exam: General: NAD  Head: Normocephalic, atraumatic. Moist oral mucosal membranes  Eyes: Anicteric  Lungs:  Clear to auscultation, normal effort, room air  Heart: Regular rate and rhythm  Abdomen:  Soft, nontender, nondistended  Extremities: No peripheral edema.  Neurologic: Alert and oriented, moving all four extremities  Skin: No  lesions  Access: Right AVF    Basic Metabolic Panel: Recent Labs  Lab 03/28/23 0743 03/29/23 0845 03/30/23 0910  NA 142 138 136  K 6.1* 5.6* 6.3*  CL 103 97* 100  CO2 20* 27 21*  GLUCOSE 159* 84 83  BUN 121* 75* 99*  CREATININE 12.50* 8.40* 9.99*  CALCIUM 7.2* 7.9* 7.2*  PHOS  --  5.8* 7.4*    Liver Function Tests: Recent Labs  Lab 03/29/23 0845 03/30/23 0910  ALBUMIN 3.5 3.2*   No results for input(s): "LIPASE", "AMYLASE" in the last 168 hours. No results for input(s): "AMMONIA" in the last 168 hours.  CBC: Recent Labs  Lab 03/28/23 0743 03/30/23 0910  WBC 4.2 2.9*  HGB 9.6* 9.5*  HCT 30.4* 29.4*  MCV 91.6 89.4  PLT 134* 122*    Cardiac Enzymes: No results for input(s): "CKTOTAL", "CKMB", "CKMBINDEX", "TROPONINI" in the last 168 hours.  BNP: Invalid input(s): "POCBNP"  CBG: Recent Labs  Lab 03/28/23 2156  GLUCAP 155*    Microbiology: Results for orders placed or performed during the hospital encounter of 05/03/22  Resp panel by RT-PCR (RSV, Flu A&B, Covid) Anterior Nasal Swab     Status: None   Collection Time: 05/03/22  7:05 AM   Specimen: Anterior Nasal Swab  Result Value Ref Range Status   SARS Coronavirus 2 by RT PCR NEGATIVE NEGATIVE Final    Comment: (NOTE) SARS-CoV-2 target nucleic acids are NOT DETECTED.  The SARS-CoV-2 RNA is generally detectable in upper respiratory specimens during the acute phase of infection. The lowest concentration of SARS-CoV-2 viral copies this assay can detect is 138 copies/mL. A negative result does not preclude SARS-Cov-2  infection and should not be used as the sole basis for treatment or other patient management decisions. A negative result may occur with  improper specimen collection/handling, submission of specimen other than nasopharyngeal swab, presence of viral mutation(s) within the areas targeted by this assay, and inadequate number of viral copies(<138 copies/mL). A negative result must be  combined with clinical observations, patient history, and epidemiological information. The expected result is Negative.  Fact Sheet for Patients:  BloggerCourse.com  Fact Sheet for Healthcare Providers:  SeriousBroker.it  This test is no t yet approved or cleared by the [place] FDA and  has been authorized for detection and/or diagnosis of SARS-CoV-2 by FDA under an Emergency Use Authorization (EUA). This EUA will remain  in effect (meaning this test can be used) for the duration of the COVID-19 declaration under Section 564(b)(1) of the Act, 21 U.S.C.section 360bbb-3(b)(1), unless the authorization is terminated  or revoked sooner.       Influenza A by PCR NEGATIVE NEGATIVE Final   Influenza B by PCR NEGATIVE NEGATIVE Final    Comment: (NOTE) The Xpert Xpress SARS-CoV-2/FLU/RSV plus assay is intended as an aid in the diagnosis of influenza from Nasopharyngeal swab specimens and should not be used as a sole basis for treatment. Nasal washings and aspirates are unacceptable for Xpert Xpress SARS-CoV-2/FLU/RSV testing.  Fact Sheet for Patients: BloggerCourse.com  Fact Sheet for Healthcare Providers: SeriousBroker.it  This test is not yet approved or cleared by the [place] FDA and has been authorized for detection and/or diagnosis of SARS-CoV-2 by FDA under an Emergency Use Authorization (EUA). This EUA will remain in effect (meaning this test can be used) for the duration of the COVID-19 declaration under Section 564(b)(1) of the Act, 21 U.S.C. section 360bbb-3(b)(1), unless the authorization is terminated or revoked.     Resp Syncytial Virus by PCR NEGATIVE NEGATIVE Final    Comment: (NOTE) Fact Sheet for Patients: BloggerCourse.com  Fact Sheet for Healthcare Providers: SeriousBroker.it  This test is not yet  approved or cleared by the [place] FDA and has been authorized for detection and/or diagnosis of SARS-CoV-2 by FDA under an Emergency Use Authorization (EUA). This EUA will remain in effect (meaning this test can be used) for the duration of the COVID-19 declaration under Section 564(b)(1) of the Act, 21 U.S.C. section 360bbb-3(b)(1), unless the authorization is terminated or revoked.  Performed at [facility], [address] [place], [place] [address]   C Difficile Quick Screen w PCR reflex     Status: None   Collection Time: 05/03/22  9:24 AM   Specimen: STOOL  Result Value Ref Range Status   C Diff antigen NEGATIVE NEGATIVE Final   C Diff toxin NEGATIVE NEGATIVE Final   C Diff interpretation No C. difficile detected.  Final    Comment: Performed at [facility], [address] [place], [place] [address]  Gastrointestinal Panel by PCR , Stool     Status: Abnormal   Collection Time: 05/03/22  9:24 AM   Specimen: STOOL  Result Value Ref Range Status   Campylobacter species NOT DETECTED NOT DETECTED Final   Plesimonas shigelloides NOT DETECTED NOT DETECTED Final   Salmonella species NOT DETECTED NOT DETECTED Final   Yersinia enterocolitica NOT DETECTED NOT DETECTED Final   Vibrio species NOT DETECTED NOT DETECTED Final   Vibrio cholerae NOT DETECTED NOT DETECTED Final   Enteroaggregative E coli (EAEC) NOT DETECTED NOT DETECTED Final   Enteropathogenic E coli (EPEC) NOT DETECTED  NOT DETECTED Final   Enterotoxigenic E coli (ETEC) NOT DETECTED NOT DETECTED Final   Shiga like toxin producing E coli (STEC) NOT DETECTED NOT DETECTED Final   Shigella/Enteroinvasive E coli (EIEC) NOT DETECTED NOT DETECTED Final   Cryptosporidium NOT DETECTED NOT DETECTED Final   Cyclospora cayetanensis NOT DETECTED NOT DETECTED Final   Entamoeba histolytica NOT DETECTED NOT DETECTED Final   Giardia lamblia NOT DETECTED NOT DETECTED Final   Adenovirus F40/41 DETECTED (A) NOT  DETECTED Final   Astrovirus NOT DETECTED NOT DETECTED Final   Norovirus GI/GII NOT DETECTED NOT DETECTED Final   Rotavirus A NOT DETECTED NOT DETECTED Final   Sapovirus (I, II, IV, and V) NOT DETECTED NOT DETECTED Final    Comment: Performed at [facility], [address] [place], [place] [address]    Coagulation Studies: No results for input(s): "LABPROT", "INR" in the last 72 hours.  Urinalysis: Recent Labs    03/28/23 1216  COLORURINE YELLOW*  LABSPEC 1.009  PHURINE 9.0*  GLUCOSEU 50*  HGBUR NEGATIVE  BILIRUBINUR NEGATIVE  KETONESUR NEGATIVE  PROTEINUR 30*  NITRITE NEGATIVE  LEUKOCYTESUR MODERATE*      Imaging: MR BRAIN WO CONTRAST  Result Date: 03/30/2023 CLINICAL DATA:  Acute neurologic deficit EXAM: MRI HEAD WITHOUT CONTRAST TECHNIQUE: Multiplanar, multiecho pulse sequences of the brain and surrounding structures were obtained without intravenous contrast. COMPARISON:  11/28/2020 FINDINGS: Truncated examination.  5 sequences were obtained. Brain: No acute infarct, mass effect or extra-axial collection. No chronic microhemorrhage or siderosis. There is multifocal hyperintense T2-weighted signal within the white matter. Parenchymal volume and CSF spaces are normal. The midline structures are normal. Vascular: Normal flow voids. Skull and upper cervical spine: Normal marrow signal. Sinuses/Orbits: Negative. Other: None. IMPRESSION: 1. No acute intracranial abnormality. 2. Findings of chronic small vessel ischemia. Electronically Signed   By: Deatra Robinson M.D.   On: 03/30/2023 00:39   ECHOCARDIOGRAM COMPLETE  Result Date: 03/28/2023    ECHOCARDIOGRAM REPORT   Patient Name:   Tonya Myers Date of Exam: 03/28/2023 Medical Rec #:  [id number]       Height:       66.0 in Accession #:    [id number]      Weight:       175.0 lb Date of Birth:  [birth year]       BSA:          1.889 m Patient Age:    57 years        BP:           185/98 mmHg Patient Gender: F               HR:            60 bpm. Exam Location:  [facility] Procedure: 2D Echo, Cardiac Doppler and Color Doppler Indications:     Stroke I63.9  History:         Patient has prior history of Echocardiogram examinations, most                  recent 12/10/2021.  Sonographer:     Cristela Blue Referring Phys:  [id number] Emeline General Diagnosing Phys: Lorine Bears MD IMPRESSIONS  1. Left ventricular ejection fraction, by estimation, is 55 to 60%. The left ventricle has normal function. The left ventricle has no regional wall motion abnormalities. There is mild left ventricular hypertrophy. Left ventricular diastolic parameters are indeterminate.  2. Right ventricular systolic function is normal. The right ventricular size is  normal. Tricuspid regurgitation signal is inadequate for assessing PA pressure.  3. Left atrial size was mildly dilated.  4. The mitral valve is normal in structure. Mild mitral valve regurgitation. No evidence of mitral stenosis.  5. The aortic valve is normal in structure. Aortic valve regurgitation is not visualized. No aortic stenosis is present. FINDINGS  Left Ventricle: Left ventricular ejection fraction, by estimation, is 55 to 60%. The left ventricle has normal function. The left ventricle has no regional wall motion abnormalities. The left ventricular internal cavity size was normal in size. There is  mild left ventricular hypertrophy. Left ventricular diastolic parameters are indeterminate. Right Ventricle: The right ventricular size is normal. No increase in right ventricular wall thickness. Right ventricular systolic function is normal. Tricuspid regurgitation signal is inadequate for assessing PA pressure. Left Atrium: Left atrial size was mildly dilated. Right Atrium: Right atrial size was normal in size. Pericardium: There is no evidence of pericardial effusion. Mitral Valve: The mitral valve is normal in structure. Mild mitral valve regurgitation. No evidence of mitral valve stenosis. Tricuspid Valve: The  tricuspid valve is normal in structure. Tricuspid valve regurgitation is not demonstrated. No evidence of tricuspid stenosis. Aortic Valve: The aortic valve is normal in structure. Aortic valve regurgitation is not visualized. No aortic stenosis is present. Aortic valve mean gradient measures 3.0 mmHg. Aortic valve peak gradient measures 6.2 mmHg. Aortic valve area, by VTI measures 3.18 cm. Pulmonic Valve: The pulmonic valve was normal in structure. Pulmonic valve regurgitation is not visualized. No evidence of pulmonic stenosis. Aorta: The aortic root is normal in size and structure. Venous: The inferior vena cava was not well visualized. IAS/Shunts: No atrial level shunt detected by color flow Doppler.  LEFT VENTRICLE PLAX 2D LVIDd:         4.90 cm   Diastology LVIDs:         3.60 cm   LV e' medial:    7.07 cm/s LV PW:         1.30 cm   LV E/e' medial:  17.5 LV IVS:        1.30 cm   LV e' lateral:   12.50 cm/s LVOT diam:     2.10 cm   LV E/e' lateral: 9.9 LV SV:         89 LV SV Index:   47 LVOT Area:     3.46 cm  RIGHT VENTRICLE RV Basal diam:  2.90 cm RV Mid diam:    2.60 cm RV S prime:     13.80 cm/s TAPSE (M-mode): 2.7 cm LEFT ATRIUM             Index        RIGHT ATRIUM           Index LA diam:        4.80 cm 2.54 cm/m   RA Area:     10.60 cm LA Vol (A2C):   57.4 ml 30.38 ml/m  RA Volume:   21.10 ml  11.17 ml/m LA Vol (A4C):   33.5 ml 17.73 ml/m LA Biplane Vol: 47.5 ml 25.14 ml/m  AORTIC VALVE AV Area (Vmax):    2.68 cm AV Area (Vmean):   2.68 cm AV Area (VTI):     3.18 cm AV Vmax:           125.00 cm/s AV Vmean:          86.400 cm/s AV VTI:  0.280 m AV Peak Grad:      6.2 mmHg AV Mean Grad:      3.0 mmHg LVOT Vmax:         96.60 cm/s LVOT Vmean:        66.800 cm/s LVOT VTI:          0.257 m LVOT/AV VTI ratio: 0.92  AORTA Ao Root diam: 2.90 cm MITRAL VALVE                TRICUSPID VALVE MV Area (PHT): 3.83 cm     TR Peak grad:   25.2 mmHg MV Decel Time: 198 msec     TR Vmax:         251.00 cm/s MV E velocity: 124.00 cm/s MV A velocity: 78.60 cm/s   SHUNTS MV E/A ratio:  1.58         Systemic VTI:  0.26 m                             Systemic Diam: 2.10 cm Lorine Bears MD Electronically signed by Lorine Bears MD Signature Date/Time: 03/28/2023/1:02:46 PM    Final      Medications:      amitriptyline  10 mg Oral QHS   ascorbic acid  500 mg Oral BID   aspirin EC  81 mg Oral Daily   atorvastatin  20 mg Oral QHS   Chlorhexidine Gluconate Cloth  6 each Topical Q0600   fluticasone  1 spray Each Nare BID   folic acid  1 mg Oral Daily   gabapentin  300 mg Oral QHS   heparin  5,000 Units Subcutaneous Q12H   levothyroxine  125 mcg Oral Q0600   loratadine  10 mg Oral Daily   multivitamin  1 tablet Oral QHS   senna-docusate  2 tablet Oral QHS   sevelamer carbonate  800 mg Oral TID WC   valACYclovir  500 mg Oral Daily   zinc sulfate (50mg  elemental zinc)  220 mg Oral Daily   acetaminophen, albuterol, loperamide, ondansetron, senna-docusate  Assessment/ Plan:  [name] is a 57 y.o.  female with past medical history including depression, type 2 diabetes, asthma, hypertension, hypothyroidism, CLL, and end-stage renal disease on hemodialysis.  She presents to the emergency department with complaints of weakness and has been admitted for Hyperkalemia [E87.5] Weakness [R53.1] Stroke (HCC) [I63.9] Elevated BUN [R79.9] ESRD (end stage renal disease) on dialysis (HCC) [N18.6, Z99.2]  [facility]/MWF/right AVF/89.1 kg  End-stage renal disease with hyperkalemia on hemodialysis. Potassium 5.6 yesterday. Received Lokelma. Potassium 6.3 today, will correct with dialysis. Patient placed on heart healthy, low potassium diet. Receiving dialysis today, UF goal 2L as tolerated. Next treatment scheduled for Friday. If potassium stays elevated, will transition to full renal diet.   2. Anemia of chronic kidney disease Lab Results  Component Value Date   HGB 9.5 (L)  03/30/2023    Hemoglobin at goal.  Patient receives Mircera at outpatient clinic.  Will continue to monitor for need of ESA.  3. Secondary Hyperparathyroidism: with outpatient labs: PTH 1325, phosphorus 6.6, calcium 7.8 on 02/28/23.   Lab Results  Component Value Date   CALCIUM 7.2 (L) 03/30/2023   PHOS 7.4 (H) 03/30/2023    Prescribed calcitriol and renvela outpatient.  Calcium acceptable with slightly elevated phosphorus.  Will continue sevelamer with meals.  4. 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 Home regimen  includes Humalog. Most recent hemoglobin A1c is 4.9 on 12/09/21.     LOS: 2 Math Brazie 12/4/202411:21 AM

## 2023-03-30 NOTE — Progress Notes (Signed)
Attempted to introduce patient to role of nurse navigator. Patient out of room for dialysis

## 2023-03-30 NOTE — Discharge Summary (Signed)
Physician Discharge Summary   Patient: Tonya Myers MRN: [id number] DOB: 02-Mar-1966  Admit date:     03/28/2023  Discharge date: 03/30/23  Discharge Physician: Loyce Dys   PCP: [name], MD   Recommendations at discharge:  Follow-up with nephrology  Discharge Diagnoses: Dysarthria Right hand weakness Hyperkalemia: COPD: No exacerbation Hypothyroidism: Obesity with BMI 32.42:  Hospital Course: [name] is a 57 y.o. female with medical history significant of ESRD on HD MWF, COPD, cervical radiculopathy, lumbar spine radiculopathy, IIDM, anxiety/depression, hypothyroidism, presented with worsening of speech problems and worsening of right hand weakness and numbness. Patient was admitted to the hospitalist service for further management and evaluation to rule out stroke.  MRI did not show acute stroke, potassium improved with hemodialysis and currently mental status is back to her baseline.  Patient is therefore cleared for discharge to follow-up with primary care physician and nephrologist.       Consultants: Nephrology Procedures performed: HD Disposition: Home Diet recommendation:  Renal diet DISCHARGE MEDICATION: Allergies as of 03/30/2023       Reactions   Amoxapine    Other Reaction(s): Unknown   Ceftazidime    Other reaction(s): Confusion, Hallucination, Unknown Encephalopathy - improved after dialysis/some concern for cephalosporin neurotoxicity Tolerated without confusion 06/2021. Encephalopathy - improved after dialysis/some concern for cephalosporin neurotoxicity Tolerated without confusion 06/2021.   Penicillins    Other Reaction(s): Unknown        Medication List     STOP taking these medications    hydrochlorothiazide 12.5 MG tablet Commonly known as: HYDRODIURIL       TAKE these medications    acetaminophen 325 MG tablet Commonly known as: TYLENOL Take 2 tablets (650 mg total) by mouth every 6 (six) hours as needed for mild  pain (or Fever >/= 101).   albuterol 108 (90 Base) MCG/ACT inhaler Commonly known as: VENTOLIN HFA Inhale 2 puffs into the lungs every 6 (six) hours as needed for wheezing or shortness of breath.   amitriptyline 10 MG tablet Commonly known as: ELAVIL Take 10 mg by mouth at bedtime.   ascorbic acid 500 MG tablet Commonly known as: VITAMIN C Take 1 tablet (500 mg total) by mouth 2 (two) times daily.   aspirin EC 81 MG tablet Take 1 tablet (81 mg total) by mouth daily. Swallow whole.   atorvastatin 20 MG tablet Commonly known as: LIPITOR Take 20 mg by mouth daily.   cetirizine 10 MG tablet Commonly known as: ZYRTEC Take 10 mg by mouth daily.   Dexcom G6 Sensor Misc SMARTSIG:1 Each Topical Every 10 Days   fluticasone 50 MCG/ACT nasal spray Commonly known as: FLONASE 1 spray 2 (two) times daily.   folic acid 1 MG tablet Commonly known as: FOLVITE Take 1 tablet by mouth daily.   gabapentin 300 MG capsule Commonly known as: NEURONTIN Take 1 capsule (300 mg total) by mouth 3 (three) times daily.   lanthanum 1000 MG chewable tablet Commonly known as: FOSRENOL Chew 1,000 mg by mouth 3 (three) times daily.   levothyroxine 125 MCG tablet Commonly known as: SYNTHROID Take 125 mcg by mouth daily.   multivitamin Tabs tablet Take 1 tablet by mouth at bedtime.   ondansetron 4 MG disintegrating tablet Commonly known as: ZOFRAN-ODT Take 1 tablet (4 mg total) by mouth every 8 (eight) hours as needed for nausea or vomiting.   propranolol 20 MG tablet Commonly known as: INDERAL Take 20 mg by mouth 3 (three) times daily.   senna-docusate 8.6-50  MG tablet Commonly known as: Senokot-S Take 2 tablets by mouth at bedtime.   sevelamer carbonate 800 MG tablet Commonly known as: RENVELA Take 800 mg by mouth 3 (three) times daily.   telmisartan 80 MG tablet Commonly known as: MICARDIS Take 80 mg by mouth daily.   valACYclovir 500 MG tablet Commonly known as: VALTREX Take 500  mg by mouth daily.   zinc sulfate (50mg  elemental zinc) 220 (50 Zn) MG capsule Take 1 capsule (220 mg total) by mouth daily.        Follow-up Information     [name], MD Follow up.   Specialty: Family Medicine Why: Hospital follow up Contact information: [address] [place] [address] [phone]                Discharge Exam: Ceasar Mons Weights   03/30/23 0425 03/30/23 0842 03/30/23 1240  Weight: 95 kg 92.7 kg 91.5 kg   General -middle-aged African-American female, no apparent distress HEENT - PERRLA, EOMI, atraumatic head, non tender sinuses. Lung - Clear, diffuse rales, rhonchi, no wheezes. Heart - S1, S2 heard, no murmurs, rubs, trace pedal edema. Abdomen - Soft, non tender, bowel sounds good Neuro - Alert, awake and oriented x 3, non focal exam. Skin - Warm and dry.  Condition at discharge: good  The results of significant diagnostics from this hospitalization (including imaging, microbiology, ancillary and laboratory) are listed below for reference.   Imaging Studies: MR BRAIN WO CONTRAST  Result Date: 03/30/2023 CLINICAL DATA:  Acute neurologic deficit EXAM: MRI HEAD WITHOUT CONTRAST TECHNIQUE: Multiplanar, multiecho pulse sequences of the brain and surrounding structures were obtained without intravenous contrast. COMPARISON:  11/28/2020 FINDINGS: Truncated examination.  5 sequences were obtained. Brain: No acute infarct, mass effect or extra-axial collection. No chronic microhemorrhage or siderosis. There is multifocal hyperintense T2-weighted signal within the white matter. Parenchymal volume and CSF spaces are normal. The midline structures are normal. Vascular: Normal flow voids. Skull and upper cervical spine: Normal marrow signal. Sinuses/Orbits: Negative. Other: None. IMPRESSION: 1. No acute intracranial abnormality. 2. Findings of chronic small vessel ischemia. Electronically Signed   By: Deatra Robinson M.D.   On: 03/30/2023  00:39   ECHOCARDIOGRAM COMPLETE  Result Date: 03/28/2023    ECHOCARDIOGRAM REPORT   Patient Name:   Tonya Myers Date of Exam: 03/28/2023 Medical Rec #:  [id number]       Height:       66.0 in Accession #:    [id number]      Weight:       175.0 lb Date of Birth:  04/20/1966       BSA:          1.889 m Patient Age:    57 years        BP:           185/98 mmHg Patient Gender: F               HR:           60 bpm. Exam Location:  [facility] Procedure: 2D Echo, Cardiac Doppler and Color Doppler Indications:     Stroke I63.9  History:         Patient has prior history of Echocardiogram examinations, most                  recent 12/10/2021.  Sonographer:     Cristela Blue Referring Phys:  [id number] Emeline General Diagnosing Phys: Lorine Bears MD IMPRESSIONS  1. Left ventricular ejection fraction, by estimation, is 55 to 60%. The left ventricle has normal function. The left ventricle has no regional wall motion abnormalities. There is mild left ventricular hypertrophy. Left ventricular diastolic parameters are indeterminate.  2. Right ventricular systolic function is normal. The right ventricular size is normal. Tricuspid regurgitation signal is inadequate for assessing PA pressure.  3. Left atrial size was mildly dilated.  4. The mitral valve is normal in structure. Mild mitral valve regurgitation. No evidence of mitral stenosis.  5. The aortic valve is normal in structure. Aortic valve regurgitation is not visualized. No aortic stenosis is present. FINDINGS  Left Ventricle: Left ventricular ejection fraction, by estimation, is 55 to 60%. The left ventricle has normal function. The left ventricle has no regional wall motion abnormalities. The left ventricular internal cavity size was normal in size. There is  mild left ventricular hypertrophy. Left ventricular diastolic parameters are indeterminate. Right Ventricle: The right ventricular size is normal. No increase in right ventricular wall thickness. Right ventricular systolic  function is normal. Tricuspid regurgitation signal is inadequate for assessing PA pressure. Left Atrium: Left atrial size was mildly dilated. Right Atrium: Right atrial size was normal in size. Pericardium: There is no evidence of pericardial effusion. Mitral Valve: The mitral valve is normal in structure. Mild mitral valve regurgitation. No evidence of mitral valve stenosis. Tricuspid Valve: The tricuspid valve is normal in structure. Tricuspid valve regurgitation is not demonstrated. No evidence of tricuspid stenosis. Aortic Valve: The aortic valve is normal in structure. Aortic valve regurgitation is not visualized. No aortic stenosis is present. Aortic valve mean gradient measures 3.0 mmHg. Aortic valve peak gradient measures 6.2 mmHg. Aortic valve area, by VTI measures 3.18 cm. Pulmonic Valve: The pulmonic valve was normal in structure. Pulmonic valve regurgitation is not visualized. No evidence of pulmonic stenosis. Aorta: The aortic root is normal in size and structure. Venous: The inferior vena cava was not well visualized. IAS/Shunts: No atrial level shunt detected by color flow Doppler.  LEFT VENTRICLE PLAX 2D LVIDd:         4.90 cm   Diastology LVIDs:         3.60 cm   LV e' medial:    7.07 cm/s LV PW:         1.30 cm   LV E/e' medial:  17.5 LV IVS:        1.30 cm   LV e' lateral:   12.50 cm/s LVOT diam:     2.10 cm   LV E/e' lateral: 9.9 LV SV:         89 LV SV Index:   47 LVOT Area:     3.46 cm  RIGHT VENTRICLE RV Basal diam:  2.90 cm RV Mid diam:    2.60 cm RV S prime:     13.80 cm/s TAPSE (M-mode): 2.7 cm LEFT ATRIUM             Index        RIGHT ATRIUM           Index LA diam:        4.80 cm 2.54 cm/m   RA Area:     10.60 cm LA Vol (A2C):   57.4 ml 30.38 ml/m  RA Volume:   21.10 ml  11.17 ml/m LA Vol (A4C):   33.5 ml 17.73 ml/m LA Biplane Vol: 47.5 ml 25.14 ml/m  AORTIC VALVE AV Area (Vmax):    2.68 cm AV Area (Vmean):  2.68 cm AV Area (VTI):     3.18 cm AV Vmax:           125.00 cm/s AV  Vmean:          86.400 cm/s AV VTI:            0.280 m AV Peak Grad:      6.2 mmHg AV Mean Grad:      3.0 mmHg LVOT Vmax:         96.60 cm/s LVOT Vmean:        66.800 cm/s LVOT VTI:          0.257 m LVOT/AV VTI ratio: 0.92  AORTA Ao Root diam: 2.90 cm MITRAL VALVE                TRICUSPID VALVE MV Area (PHT): 3.83 cm     TR Peak grad:   25.2 mmHg MV Decel Time: 198 msec     TR Vmax:        251.00 cm/s MV E velocity: 124.00 cm/s MV A velocity: 78.60 cm/s   SHUNTS MV E/A ratio:  1.58         Systemic VTI:  0.26 m                             Systemic Diam: 2.10 cm Lorine Bears MD Electronically signed by Lorine Bears MD Signature Date/Time: 03/28/2023/1:02:46 PM    Final    CT HEAD WO CONTRAST ( )  Result Date: 03/28/2023 CLINICAL DATA:  Weakness.  History of fall on Wednesday. EXAM: CT HEAD WITHOUT CONTRAST CT CERVICAL SPINE WITHOUT CONTRAST TECHNIQUE: Multidetector CT imaging of the head and cervical spine was performed following the standard protocol without intravenous contrast. Multiplanar CT image reconstructions of the cervical spine were also generated. RADIATION DOSE REDUCTION: This exam was performed according to the departmental dose-optimization program which includes automated exposure control, adjustment of the mA and/or kV according to patient size and/or use of iterative reconstruction technique. COMPARISON:  Head CT 12/15/2021 FINDINGS: CT HEAD FINDINGS Brain: No evidence of acute infarction, hemorrhage, hydrocephalus, extra-axial collection or mass lesion/mass effect. Vascular: No hyperdense vessel or unexpected calcification. Skull: Normal. Negative for fracture or focal lesion. Sinuses/Orbits: No evidence of injury CT CERVICAL SPINE FINDINGS Alignment: Normal. Skull base and vertebrae: No acute fracture. No primary bone lesion or focal pathologic process. Soft tissues and spinal canal: No prevertebral fluid or swelling. No visible canal hematoma. Disc levels: Generalized disc space  narrowing with endplate ridging and disc protrusions with buttressing osteophytes, especially seen at C3-4 and C4-5 where there is ventral cord contact. Upper chest: Negative IMPRESSION: 1. No evidence of acute intracranial or cervical spine injury. 2. 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 Electronically Signed   By: Tiburcio Pea M.D.   On: 03/28/2023 07:39   CT Cervical Spine Wo Contrast  Result Date: 03/28/2023 CLINICAL DATA:  Weakness.  History of fall on Wednesday. EXAM: CT HEAD WITHOUT CONTRAST CT CERVICAL SPINE WITHOUT CONTRAST TECHNIQUE: Multidetector CT imaging of the head and cervical spine was performed following the standard protocol without intravenous contrast. Multiplanar CT image reconstructions of the cervical spine were also generated. RADIATION DOSE REDUCTION: This exam was performed according to the departmental dose-optimization program which includes automated exposure control, adjustment of the mA and/or kV according to patient size and/or use of iterative reconstruction technique. COMPARISON:  Head CT  12/15/2021 FINDINGS: CT HEAD FINDINGS Brain: No evidence of acute infarction, hemorrhage, hydrocephalus, extra-axial collection or mass lesion/mass effect. Vascular: No hyperdense vessel or unexpected calcification. Skull: Normal. Negative for fracture or focal lesion. Sinuses/Orbits: No evidence of injury CT CERVICAL SPINE FINDINGS Alignment: Normal. Skull base and vertebrae: No acute fracture. No primary bone lesion or focal pathologic process. Soft tissues and spinal canal: No prevertebral fluid or swelling. No visible canal hematoma. Disc levels: Generalized disc space narrowing with endplate ridging and disc protrusions with buttressing osteophytes, especially seen at C3-4 and C4-5 where there is ventral cord contact. Upper chest: Negative IMPRESSION: 1. No evidence of acute intracranial or cervical spine injury. 2.  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 Electronically Signed   By: Tiburcio Pea M.D.   On: 03/28/2023 07:39    Microbiology: Results for orders placed or performed during the hospital encounter of 05/03/22  Resp panel by RT-PCR (RSV, Flu A&B, Covid) Anterior Nasal Swab     Status: None   Collection Time: 05/03/22  7:05 AM   Specimen: Anterior Nasal Swab  Result Value Ref Range Status   SARS Coronavirus 2 by RT PCR NEGATIVE NEGATIVE Final    Comment: (NOTE) SARS-CoV-2 target nucleic acids are NOT DETECTED.  The SARS-CoV-2 RNA is generally detectable in upper respiratory specimens during the acute phase of infection. The lowest concentration of SARS-CoV-2 viral copies this assay can detect is 138 copies/mL. A negative result does not preclude SARS-Cov-2 infection and should not be used as the sole basis for treatment or other patient management decisions. A negative result may occur with  improper specimen collection/handling, submission of specimen other than nasopharyngeal swab, presence of viral mutation(s) within the areas targeted by this assay, and inadequate number of viral copies(<138 copies/mL). A negative result must be combined with clinical observations, patient history, and epidemiological information. The expected result is Negative.  Fact Sheet for Patients:  BloggerCourse.com  Fact Sheet for Healthcare Providers:  SeriousBroker.it  This test is no t yet approved or cleared by the [place] FDA and  has been authorized for detection and/or diagnosis of SARS-CoV-2 by FDA under an Emergency Use Authorization (EUA). This EUA will remain  in effect (meaning this test can be used) for the duration of the COVID-19 declaration under Section 564(b)(1) of the Act, 21 U.S.C.section 360bbb-3(b)(1), unless the authorization is terminated  or revoked sooner.        Influenza A by PCR NEGATIVE NEGATIVE Final   Influenza B by PCR NEGATIVE NEGATIVE Final    Comment: (NOTE) The Xpert Xpress SARS-CoV-2/FLU/RSV plus assay is intended as an aid in the diagnosis of influenza from Nasopharyngeal swab specimens and should not be used as a sole basis for treatment. Nasal washings and aspirates are unacceptable for Xpert Xpress SARS-CoV-2/FLU/RSV testing.  Fact Sheet for Patients: BloggerCourse.com  Fact Sheet for Healthcare Providers: SeriousBroker.it  This test is not yet approved or cleared by the [place] FDA and has been authorized for detection and/or diagnosis of SARS-CoV-2 by FDA under an Emergency Use Authorization (EUA). This EUA will remain in effect (meaning this test can be used) for the duration of the COVID-19 declaration under Section 564(b)(1) of the Act, 21 U.S.C. section 360bbb-3(b)(1), unless the authorization is terminated or revoked.     Resp Syncytial Virus by PCR NEGATIVE NEGATIVE Final    Comment: (NOTE) Fact Sheet for Patients: BloggerCourse.com  Fact Sheet for Healthcare Providers: SeriousBroker.it  This  test is not yet approved or cleared by the [place] and has been authorized for detection and/or diagnosis of SARS-CoV-2 by FDA under an Emergency Use Authorization (EUA). This EUA will remain in effect (meaning this test can be used) for the duration of the COVID-19 declaration under Section 564(b)(1) of the Act, 21 U.S.C. section 360bbb-3(b)(1), unless the authorization is terminated or revoked.  Performed at [facility], [address] [place], [place] [address]   C Difficile Quick Screen w PCR reflex     Status: None   Collection Time: 05/03/22  9:24 AM   Specimen: STOOL  Result Value Ref Range Status   C Diff antigen NEGATIVE NEGATIVE Final   C Diff toxin NEGATIVE NEGATIVE Final    C Diff interpretation No C. difficile detected.  Final    Comment: Performed at [facility], [address] [place], [place] [address]  Gastrointestinal Panel by PCR , Stool     Status: Abnormal   Collection Time: 05/03/22  9:24 AM   Specimen: STOOL  Result Value Ref Range Status   Campylobacter species NOT DETECTED NOT DETECTED Final   Plesimonas shigelloides NOT DETECTED NOT DETECTED Final   Salmonella species NOT DETECTED NOT DETECTED Final   Yersinia enterocolitica NOT DETECTED NOT DETECTED Final   Vibrio species NOT DETECTED NOT DETECTED Final   Vibrio cholerae NOT DETECTED NOT DETECTED Final   Enteroaggregative E coli (EAEC) NOT DETECTED NOT DETECTED Final   Enteropathogenic E coli (EPEC) NOT DETECTED NOT DETECTED Final   Enterotoxigenic E coli (ETEC) NOT DETECTED NOT DETECTED Final   Shiga like toxin producing E coli (STEC) NOT DETECTED NOT DETECTED Final   Shigella/Enteroinvasive E coli (EIEC) NOT DETECTED NOT DETECTED Final   Cryptosporidium NOT DETECTED NOT DETECTED Final   Cyclospora cayetanensis NOT DETECTED NOT DETECTED Final   Entamoeba histolytica NOT DETECTED NOT DETECTED Final   Giardia lamblia NOT DETECTED NOT DETECTED Final   Adenovirus F40/41 DETECTED (A) NOT DETECTED Final   Astrovirus NOT DETECTED NOT DETECTED Final   Norovirus GI/GII NOT DETECTED NOT DETECTED Final   Rotavirus A NOT DETECTED NOT DETECTED Final   Sapovirus (I, II, IV, and V) NOT DETECTED NOT DETECTED Final    Comment: Performed at [facility], [address] [place], [place] [address]    Labs: CBC: Recent Labs  Lab 03/28/23 0743 03/30/23 0910  WBC 4.2 2.9*  HGB 9.6* 9.5*  HCT 30.4* 29.4*  MCV 91.6 89.4  PLT 134* 122*   Basic Metabolic Panel: Recent Labs  Lab 03/28/23 0743 03/29/23 0845 03/30/23 0910 03/30/23 1430  NA 142 138 136  --   K 6.1* 5.6* 6.3* 4.0  CL 103 97* 100  --   CO2 20* 27 21*  --   GLUCOSE 159* 84 83  --   BUN 121* 75* 99*  --    CREATININE 12.50* 8.40* 9.99*  --   CALCIUM 7.2* 7.9* 7.2*  --   PHOS  --  5.8* 7.4*  --    Liver Function Tests: Recent Labs  Lab 03/29/23 0845 03/30/23 0910  ALBUMIN 3.5 3.2*   CBG: Recent Labs  Lab 03/28/23 2156  GLUCAP 155*    Discharge time spent:  35 minutes.  Signed: Loyce Dys, MD [facility] 03/30/2023

## 2023-05-27 ENCOUNTER — Encounter: Payer: Self-pay | Admitting: Podiatry

## 2023-05-27 ENCOUNTER — Ambulatory Visit (INDEPENDENT_AMBULATORY_CARE_PROVIDER_SITE_OTHER): Payer: Medicare HMO | Admitting: Podiatry

## 2023-05-27 DIAGNOSIS — G629 Polyneuropathy, unspecified: Secondary | ICD-10-CM | POA: Diagnosis not present

## 2023-05-27 DIAGNOSIS — B351 Tinea unguium: Secondary | ICD-10-CM | POA: Diagnosis not present

## 2023-05-27 DIAGNOSIS — M79675 Pain in left toe(s): Secondary | ICD-10-CM

## 2023-05-27 DIAGNOSIS — M79674 Pain in right toe(s): Secondary | ICD-10-CM | POA: Diagnosis not present

## 2023-05-27 NOTE — Progress Notes (Signed)
Chief Complaint  Patient presents with   Diabetes    "I have Neuropathy in my foot.  My toenails are dark." N - neuropathy  L - left foot D - 1 month O - suddenly, progressed C - sharp, tingling, throbbing A - touch, shoes T - Capasin, Gabapentin  N - toenails L - 1-5 bilateral D - 6 mos O - gradually worse C - thick, discolored, painful A - touch T - tried to clip them a little bit    HPI: 58 y.o. female Hx T2DM presenting today for follow-up evaluation of her severe peripheral polyneuropathy to the bilateral feet.  This is been ongoing for few years now.  She takes gabapentin with minimal relief.  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 Patient also requesting nail debridement today  Past Medical History:  Diagnosis Date   Acute hemorrhoid 03/11/2015   Anxiety    Arthritis    Asthma    Cancer (HCC)    Chronic back pain    CLL (chronic lymphocytic leukemia) (HCC)    Depression    Diabetes mellitus without complication (HCC)    Genital herpes    type 2   Hypertension    Hypothyroidism    Vertigo     Past Surgical History:  Procedure Laterality Date   ABDOMINAL HYSTERECTOMY     BILATERAL SALPINGOOPHORECTOMY  2009   BREAST BIOPSY Right 05/17/2016   FIBROADENOMATOUS CHANGE AND SCLEROSING ADENOSIS WITH   COLONOSCOPY WITH PROPOFOL N/A 06/16/2015   Procedure: COLONOSCOPY WITH PROPOFOL;  Surgeon: Elnita Maxwell, MD;  Location: [facility] ENDOSCOPY;  Service: Endoscopy;  Laterality: N/A;   DIALYSIS/PERMA CATHETER INSERTION N/A 12/17/2020   Procedure: DIALYSIS/PERMA CATHETER INSERTION;  Surgeon: Annice Needy, MD;  Location: [facility] INVASIVE CV LAB;  Service: Cardiovascular;  Laterality: N/A;   LAPAROSCOPIC SUPRACERVICAL HYSTERECTOMY  2009   due to leio   OOPHORECTOMY     TEMPORARY DIALYSIS CATHETER N/A 12/10/2020   Procedure: TEMPORARY DIALYSIS CATHETER;  Surgeon: Renford Dills, MD;  Location: [facility] INVASIVE CV LAB;  Service: Cardiovascular;  Laterality:  N/A;   THORACIC LAMINECTOMY FOR EPIDURAL ABSCESS Bilateral 06/17/2020   Procedure: THORACIC LAMINECTOMY FOR EPIDURAL ABSCESS;  Surgeon: Lucy Chris, MD;  Location: [facility] ORS;  Service: Neurosurgery;  Laterality: Bilateral;    Allergies  Allergen Reactions   Amoxapine     Other Reaction(s): Unknown   Ceftazidime     Other reaction(s): Confusion, Hallucination, Unknown Encephalopathy - improved after dialysis/some concern for cephalosporin neurotoxicity Tolerated without confusion 06/2021. Encephalopathy - improved after dialysis/some concern for cephalosporin neurotoxicity Tolerated without confusion 06/2021.    Penicillins     Other Reaction(s): Unknown     Physical Exam: General: The patient is alert and oriented x3 in no acute distress.  Dermatology: Skin is cool, dry and supple bilateral lower extremities.  Hyperkeratotic dystrophic nails noted 1-5 bilateral  Vascular: Palpable pedal pulses bilaterally. Capillary refill within normal limits.  No appreciable edema.  No erythema.  Neurological: Light touch and protective threshold diminished.  Paresthesia with pins-and-needles noted bilateral toes and feet  Musculoskeletal Exam: No pedal deformities noted  Assessment/Plan of Care: 1.  Severe peripheral polyneuropathy bilateral feet  2.  History of spinal meningitis 2022 3.  Pain due to onychomycosis of toenails both  -Patient evaluated. -Mechanical debridement of nails 1-5 bilateral was performed using a nail nipper and smoothed with a rotary bur. -Currently the patient takes gabapentin 300 mg 3 times daily without relief. Unfortunately  the patient continues to have pain and tenderness associated to her neuropathic symptoms.  Not a candidate for Qutenza due to insurance coverage. -Referral placed for pain management to consider possible spinal cord stimulator or management of her peripheral neuropathy pain -Return to clinic with me as needed     Felecia Shelling, DPM [facility]  Dr. Felecia Shelling, DPM    [address] [place], [place] [address]                Office [phone]  Fax [phone]

## 2023-06-26 ENCOUNTER — Encounter: Payer: Self-pay | Admitting: Oncology

## 2023-07-06 ENCOUNTER — Other Ambulatory Visit: Payer: Self-pay | Admitting: Podiatry

## 2023-07-15 ENCOUNTER — Encounter: Payer: Self-pay | Admitting: Oncology

## 2023-07-15 ENCOUNTER — Other Ambulatory Visit: Payer: Self-pay

## 2023-07-15 ENCOUNTER — Emergency Department
Admission: EM | Admit: 2023-07-15 | Discharge: 2023-07-15 | Disposition: A | Discharge status: Home / Self Care | Attending: Emergency Medicine | Admitting: Emergency Medicine

## 2023-07-15 ENCOUNTER — Other Ambulatory Visit: Payer: Self-pay | Admitting: *Deleted

## 2023-07-15 DIAGNOSIS — L299 Pruritus, unspecified: Secondary | ICD-10-CM | POA: Diagnosis present

## 2023-07-15 DIAGNOSIS — C9110 Chronic lymphocytic leukemia of B-cell type not having achieved remission: Secondary | ICD-10-CM

## 2023-07-15 DIAGNOSIS — Z992 Dependence on renal dialysis: Secondary | ICD-10-CM | POA: Insufficient documentation

## 2023-07-15 DIAGNOSIS — N186 End stage renal disease: Secondary | ICD-10-CM | POA: Insufficient documentation

## 2023-07-15 DIAGNOSIS — E1122 Type 2 diabetes mellitus with diabetic chronic kidney disease: Secondary | ICD-10-CM | POA: Diagnosis not present

## 2023-07-15 DIAGNOSIS — D696 Thrombocytopenia, unspecified: Secondary | ICD-10-CM | POA: Insufficient documentation

## 2023-07-15 LAB — CBC
HCT: 37.9 % (ref 36.0–46.0)
Hemoglobin: 11.5 g/dL — ABNORMAL LOW (ref 12.0–15.0)
MCH: 28.7 pg (ref 26.0–34.0)
MCHC: 30.3 g/dL (ref 30.0–36.0)
MCV: 94.5 fL (ref 80.0–100.0)
Platelets: 10 10*3/uL — CL (ref 150–400)
RBC: 4.01 MIL/uL (ref 3.87–5.11)
RDW: 19.9 % — ABNORMAL HIGH (ref 11.5–15.5)
WBC: 4.7 10*3/uL (ref 4.0–10.5)
nRBC: 0.4 % — ABNORMAL HIGH (ref 0.0–0.2)

## 2023-07-15 LAB — FERRITIN: Ferritin: 1325 ng/mL — ABNORMAL HIGH (ref 11–307)

## 2023-07-15 LAB — COMPREHENSIVE METABOLIC PANEL
ALT: 16 U/L (ref 0–44)
AST: 23 U/L (ref 15–41)
Albumin: 3.8 g/dL (ref 3.5–5.0)
Alkaline Phosphatase: 66 U/L (ref 38–126)
Anion gap: 13 (ref 5–15)
BUN: 31 mg/dL — ABNORMAL HIGH (ref 6–20)
CO2: 29 mmol/L (ref 22–32)
Calcium: 11.3 mg/dL — ABNORMAL HIGH (ref 8.9–10.3)
Chloride: 95 mmol/L — ABNORMAL LOW (ref 98–111)
Creatinine, Ser: 4.23 mg/dL — ABNORMAL HIGH (ref 0.44–1.00)
GFR, Estimated: 11 mL/min — ABNORMAL LOW (ref >60)
Glucose, Bld: 155 mg/dL — ABNORMAL HIGH (ref 70–99)
Potassium: 4 mmol/L (ref 3.5–5.1)
Sodium: 137 mmol/L (ref 135–145)
Total Bilirubin: 0.7 mg/dL (ref 0.0–1.2)
Total Protein: 7.9 g/dL (ref 6.5–8.1)

## 2023-07-15 LAB — TYPE AND SCREEN
ABO/RH(D): O POS
Antibody Screen: POSITIVE

## 2023-07-15 LAB — IRON AND TIBC
Iron: 116 ug/dL (ref 28–170)
Saturation Ratios: 45 % — ABNORMAL HIGH (ref 10.4–31.8)
TIBC: 259 ug/dL (ref 250–450)
UIBC: 143 ug/dL

## 2023-07-15 LAB — FIBRINOGEN: Fibrinogen: 436 mg/dL (ref 210–475)

## 2023-07-15 LAB — PROTIME-INR
INR: 1 (ref 0.8–1.2)
Prothrombin Time: 13.9 s (ref 11.4–15.2)

## 2023-07-15 LAB — FOLATE: Folate: 17.4 ng/mL (ref >5.9)

## 2023-07-15 LAB — LACTATE DEHYDROGENASE: LDH: 193 U/L — ABNORMAL HIGH (ref 98–192)

## 2023-07-15 LAB — APTT: aPTT: 37 s — ABNORMAL HIGH (ref 24–36)

## 2023-07-15 MED ORDER — HYDROXYZINE HCL 25 MG PO TABS: 25.0000 mg | ORAL_TABLET | Freq: Three times a day (TID) | ORAL | 0 refills | Status: DC | PRN

## 2023-07-15 MED ORDER — DIPHENHYDRAMINE HCL 50 MG/ML IJ SOLN
25.0000 mg | Freq: Once | INTRAMUSCULAR | Status: AC
  Administered 2023-07-15: 25 mg via INTRAVENOUS
  Filled 2023-07-15: qty 1

## 2023-07-15 MED ORDER — ACETAMINOPHEN 325 MG PO TABS
650.0000 mg | ORAL_TABLET | Freq: Once | ORAL | Status: AC
  Administered 2023-07-15: 650 mg via ORAL
  Filled 2023-07-15: qty 2

## 2023-07-15 MED ORDER — SODIUM CHLORIDE 0.9% IV SOLUTION
Freq: Once | INTRAVENOUS | Status: DC
  Filled 2023-07-15: qty 250

## 2023-07-15 MED ORDER — DEXAMETHASONE SODIUM PHOSPHATE 10 MG/ML IJ SOLN
10.0000 mg | Freq: Once | INTRAMUSCULAR | Status: AC
  Administered 2023-07-15: 10 mg via INTRAMUSCULAR
  Filled 2023-07-15: qty 1

## 2023-07-15 NOTE — ED Notes (Signed)
 No information in chart regarding contact info for legal guardian. Pt states she can attempt to contact and notify of arrival

## 2023-07-15 NOTE — ED Provider Notes (Signed)
-----------------------------------------   3:19 PM on 07/15/2023 -----------------------------------------  Blood pressure (!) 157/92, pulse 79, temperature 98.2 F (36.8 C), temperature source Oral, resp. rate 18, height 6' (1.829 m), weight 93.4 kg, SpO2 100%.  Assuming care from [employer], PA-C.  In short, Tonya Myers is a 58 y.o. female with a chief complaint of Pruritis .  Refer to the original H&P for additional details.  The current plan of care is to reassess after platelet infusion has finished.  ----------------------------------------- 5:21 PM on 07/15/2023 ----------------------------------------- Infusion complete. Patient denies new complaints. She will be discharged home to follow up outpatient as scheduled. She was advised to return to the ER for symptoms that change or worsen if unable to see PCP or specialists.       Chinita Pester, FNP 07/15/23 1723    Phineas Semen, MD 07/15/23 [phone]

## 2023-07-15 NOTE — ED Provider Notes (Signed)
 [facility] Provider Note    Event Date/Time   First MD Initiated Contact with Patient 07/15/23 [phone]     (approximate)   History   Pruritis   HPI  Tonya Myers is a 58 y.o. female with PMH of ESRD on hemodialysis, type 2 diabetes, CLL presents for evaluation of generalized pruritus.  Patient states that this has been going on for 3 weeks.  She was seen at urgent care and was given a steroid shot and some hydrocortisone cream.  Patient was at her dialysis appointment today and only completed half of it before coming to the emergency department.  Patient request that she finished her dialysis treatment here.      Physical Exam   Triage Vital Signs: ED Triage Vitals  Encounter Vitals Group     BP 07/15/23 0941 (!) 115/94     Systolic BP Percentile --      Diastolic BP Percentile --      Pulse Rate 07/15/23 0947 92     Resp 07/15/23 0941 18     Temp 07/15/23 0941 98.3 F (36.8 C)     Temp src --      SpO2 07/15/23 0947 100 %     Weight 07/15/23 0941 206 lb (93.4 kg)     Height 07/15/23 0941 6' (1.829 m)     Head Circumference --      Peak Flow --      Pain Score 07/15/23 0941 0     Pain Loc --      Pain Education --      Exclude from Growth Chart --     Most recent vital signs: Vitals:   07/15/23 0941 07/15/23 0947  BP: (!) 115/94   Pulse:  92  Resp: 18   Temp: 98.3 F (36.8 C)   SpO2:  100%   General: Awake, no distress.  CV:  Good peripheral perfusion.  Resp:  Normal effort. Abd:  No distention.  Other:  Scratch marks across abdomen chest and back with small excoriations and spots in various stages of healing.  Dry skin on the lower legs with excoriations. No urticaria noted.   ED Results / Procedures / Treatments   Labs (all labs ordered are listed, but only abnormal results are displayed) Labs Reviewed  COMPREHENSIVE METABOLIC PANEL - Abnormal; Notable for the following components:      Result Value   Chloride 95  (*)    Glucose, Bld 155 (*)    BUN 31 (*)    Creatinine, Ser 4.23 (*)    Calcium 11.3 (*)    GFR, Estimated 11 (*)    All other components within normal limits  CBC - Abnormal; Notable for the following components:   Hemoglobin 11.5 (*)    RDW 19.9 (*)    Platelets 10 (*)    nRBC 0.4 (*)    All other components within normal limits  DIRECT PLATELET ANTIBODY  IRON AND TIBC  FERRITIN  VITAMIN B12  FOLATE  COMP PANEL: LEUKEMIA/LYMPHOMA  PROTEIN ELECTROPHORESIS, SERUM  LACTATE DEHYDROGENASE  HAPTOGLOBIN  PROTIME-INR  APTT  FIBRINOGEN  PREPARE PLATELET PHERESIS  TYPE AND SCREEN     PROCEDURES:  Critical Care performed: Yes, see critical care procedure note(s)  Procedures   MEDICATIONS ORDERED IN ED: Medications  0.9 %  sodium chloride infusion (Manually program via Guardrails IV Fluids) (has no administration in time range)  dexamethasone (DECADRON) injection 10 mg (10 mg Intramuscular Given  07/15/23 1118)  diphenhydrAMINE (BENADRYL) injection 25 mg (25 mg Intravenous Given 07/15/23 1412)  acetaminophen (TYLENOL) tablet 650 mg (650 mg Oral Given 07/15/23 1415)     IMPRESSION / MDM / ASSESSMENT AND PLAN / ED COURSE  I reviewed the triage vital signs and the nursing notes.                             58 year old female presents for evaluation of generalized itching.  Vital signs are stable, patient a bit uncomfortable on exam.  Differential diagnosis includes, but is not limited to, atopic dermatitis, psoriasis, allergic reaction, anaphylaxis, cholestasis, urticaria, xerosis, scabies, tinea corporis.  Patient's presentation is most consistent with acute complicated illness / injury requiring diagnostic workup.  Will check CMP and CBC as patient was unable to complete dialysis today.   CMP overall reassuring, potassium is not elevated. BUN is slightly elevated as well as the calcium but I do not feel that patient warrants emergent dialysis at this time and would be  appropriate to have dialysis as an outpatient at her normally scheduled time on Monday.   CBC notable for platelets of 10.  There is no evidence of bleeding at this time, no purpura or petechiae noted on patient's skin.  I spoke with the patient's oncologist, Dr. Orlie Dakin, who recommended platelet transfusion and some blood work.  He placed orders for this.  He did not feel patient needed to be admitted at this time and he will see patient in his office on Monday.  I am unsure of what is causing patient's generalized itching. I encouraged her to make sure her skin is well moisturized. Do not see findings consistent with atopic dermatitis, psoriasis, urticaria, tinea corporis or scabies. LFTs are normal making cholestasis less likely. Patient states she got good relief with a steroid shot at [facility]. Will give patient a dose of this. Did consider a steroid taper, but as patient is diabetic will hold off for now. I will also send a prescription for atarax.   Patient voiced understanding, all questions were answered and she was stable at discharge.  CRITICAL CARE Performed by: Cameron Ali  Total critical care time: 30 minutes  Critical care time was exclusive of separately billable procedures and treating other patients.  Critical care was necessary to treat or prevent imminent or life-threatening deterioration.  Critical care was time spent personally by me on the following activities: development of treatment plan with patient and/or surrogate as well as nursing, discussions with consultants, evaluation of patient's response to treatment, examination of patient, obtaining history from patient or surrogate, ordering and performing treatments and interventions, ordering and review of laboratory studies, ordering and review of radiographic studies, pulse oximetry and re-evaluation of patient's condition.      FINAL CLINICAL IMPRESSION(S) / ED DIAGNOSES   Final diagnoses:  Pruritus   Thrombocytopenia (HCC)     Rx / DC Orders   ED Discharge Orders          Ordered    hydrOXYzine (ATARAX) 25 MG tablet  3 times daily PRN        07/15/23 1109             Note:  This document was prepared using Dragon voice recognition software and may include unintentional dictation errors.   Cameron Ali, PA-C 07/15/23 1501    Sharman Cheek, MD 07/16/23 [phone]

## 2023-07-15 NOTE — Discharge Instructions (Addendum)
 You can take Atarax 3 times a day as needed for itching. I also recommend using a thick moisturizing lotion.   Please follow up with Dr. Orlie Dakin, you have an appointment scheduled for Monday, 3/24 at 2:15 pm.  Return to the ED with any worsening symptoms like development of a rash on the face, lip or tongue swelling and difficulty breathing.

## 2023-07-15 NOTE — ED Triage Notes (Signed)
 Pt to ED for generalized itching x3 weeks. Reports has been seen at [facility] for same and prescribed meds. States was at dialysis today and had d/t itching and requests dialysis to be finished here.

## 2023-07-16 LAB — PREPARE PLATELET PHERESIS: Unit division: 0

## 2023-07-16 LAB — BPAM PLATELET PHERESIS
Blood Product Expiration Date: 202503242359
ISSUE DATE / TIME: 202503211422
Unit Type and Rh: 7300

## 2023-07-16 LAB — VITAMIN B12: Vitamin B-12: 628 pg/mL (ref 180–914)

## 2023-07-18 ENCOUNTER — Inpatient Hospital Stay (HOSPITAL_BASED_OUTPATIENT_CLINIC_OR_DEPARTMENT_OTHER): Admitting: Oncology

## 2023-07-18 ENCOUNTER — Encounter: Payer: Self-pay | Admitting: Oncology

## 2023-07-18 ENCOUNTER — Inpatient Hospital Stay: Attending: Oncology

## 2023-07-18 VITALS — BP 128/89 | HR 92 | Temp 99.7°F | Resp 16 | Ht 72.0 in | Wt 206.0 lb

## 2023-07-18 DIAGNOSIS — C9110 Chronic lymphocytic leukemia of B-cell type not having achieved remission: Secondary | ICD-10-CM

## 2023-07-18 DIAGNOSIS — R7989 Other specified abnormal findings of blood chemistry: Secondary | ICD-10-CM | POA: Insufficient documentation

## 2023-07-18 DIAGNOSIS — Z803 Family history of malignant neoplasm of breast: Secondary | ICD-10-CM | POA: Diagnosis not present

## 2023-07-18 DIAGNOSIS — D696 Thrombocytopenia, unspecified: Secondary | ICD-10-CM

## 2023-07-18 DIAGNOSIS — N186 End stage renal disease: Secondary | ICD-10-CM | POA: Diagnosis not present

## 2023-07-18 DIAGNOSIS — Z992 Dependence on renal dialysis: Secondary | ICD-10-CM | POA: Insufficient documentation

## 2023-07-18 DIAGNOSIS — D649 Anemia, unspecified: Secondary | ICD-10-CM | POA: Insufficient documentation

## 2023-07-18 DIAGNOSIS — L299 Pruritus, unspecified: Secondary | ICD-10-CM | POA: Insufficient documentation

## 2023-07-18 LAB — CBC WITH DIFFERENTIAL/PLATELET
Abs Immature Granulocytes: 0.03 10*3/uL (ref 0.00–0.07)
Basophils Absolute: 0 10*3/uL (ref 0.0–0.1)
Basophils Relative: 1 %
Eosinophils Absolute: 0.1 10*3/uL (ref 0.0–0.5)
Eosinophils Relative: 3 %
HCT: 36.5 % (ref 36.0–46.0)
Hemoglobin: 11 g/dL — ABNORMAL LOW (ref 12.0–15.0)
Immature Granulocytes: 1 %
Lymphocytes Relative: 29 %
Lymphs Abs: 1.2 10*3/uL (ref 0.7–4.0)
MCH: 28.8 pg (ref 26.0–34.0)
MCHC: 30.1 g/dL (ref 30.0–36.0)
MCV: 95.5 fL (ref 80.0–100.0)
Monocytes Absolute: 0.4 10*3/uL (ref 0.1–1.0)
Monocytes Relative: 9 %
Neutro Abs: 2.3 10*3/uL (ref 1.7–7.7)
Neutrophils Relative %: 57 %
Platelets: 20 10*3/uL — ABNORMAL LOW (ref 150–400)
RBC: 3.82 MIL/uL — ABNORMAL LOW (ref 3.87–5.11)
RDW: 20.7 % — ABNORMAL HIGH (ref 11.5–15.5)
WBC: 4 10*3/uL (ref 4.0–10.5)
nRBC: 0 % (ref 0.0–0.2)

## 2023-07-18 LAB — SAMPLE TO BLOOD BANK

## 2023-07-18 NOTE — Progress Notes (Signed)
 [facility]  [phone] [phone]  ID: Mcarthur Rossetti OB: 09-Dec-1965  MR#: [id number]  AOZ#:[id number]  Patient Care Team: [name], MD as PCP - General (Family Medicine) Jeralyn Ruths, MD as Consulting Physician (Oncology)  CHIEF COMPLAINT: CLL  INTERVAL HISTORY: Patient returns to clinic today for repeat laboratory work and emergency room follow-up.  She is seen in the emergency room last week after complain of significant pruritus and was incidentally noted to have a platelet count of 10.  She has increased weakness and fatigue and continued pruritus without rash.  She otherwise feels well.  She has no neurologic complaints.  She denies any recent fevers or illnesses.  She has a fair appetite, but denies weight loss.  She does not complain of any night sweats or other B symptoms.  She has no chest pain, shortness of breath, cough, or hemoptysis.  She denies any nausea, vomiting, constipation, or diarrhea.  She has no urinary complaints.  Patient offers no further specific complaints today.    REVIEW OF SYSTEMS:   Review of Systems  Constitutional:  Positive for malaise/fatigue. Negative for fever and weight loss.  Respiratory: Negative.  Negative for cough, hemoptysis and shortness of breath.   Cardiovascular: Negative.  Negative for chest pain and leg swelling.  Gastrointestinal: Negative.  Negative for abdominal pain.  Genitourinary: Negative.  Negative for dysuria.  Musculoskeletal: Negative.  Negative for back pain.  Skin:  Positive for itching. Negative for rash.  Neurological:  Positive for weakness. Negative for dizziness, focal weakness and headaches.  Psychiatric/Behavioral: Negative.  The patient is not nervous/anxious.     As per HPI. Otherwise, a complete review of systems is negative.  PAST MEDICAL HISTORY: Past Medical History:  Diagnosis Date   Acute hemorrhoid 03/11/2015   Anxiety    Arthritis    Asthma     Cancer (HCC)    Chronic back pain    CLL (chronic lymphocytic leukemia) (HCC)    Depression    Diabetes mellitus without complication (HCC)    Genital herpes    type 2   Hypertension    Hypothyroidism    Vertigo     PAST SURGICAL HISTORY: Past Surgical History:  Procedure Laterality Date   ABDOMINAL HYSTERECTOMY     BILATERAL SALPINGOOPHORECTOMY  2009   BREAST BIOPSY Right 05/17/2016   FIBROADENOMATOUS CHANGE AND SCLEROSING ADENOSIS WITH   COLONOSCOPY WITH PROPOFOL N/A 06/16/2015   Procedure: COLONOSCOPY WITH PROPOFOL;  Surgeon: Elnita Maxwell, MD;  Location: [facility] ENDOSCOPY;  Service: Endoscopy;  Laterality: N/A;   DIALYSIS/PERMA CATHETER INSERTION N/A 12/17/2020   Procedure: DIALYSIS/PERMA CATHETER INSERTION;  Surgeon: Annice Needy, MD;  Location: [facility] INVASIVE CV LAB;  Service: Cardiovascular;  Laterality: N/A;   LAPAROSCOPIC SUPRACERVICAL HYSTERECTOMY  2009   due to leio   OOPHORECTOMY     TEMPORARY DIALYSIS CATHETER N/A 12/10/2020   Procedure: TEMPORARY DIALYSIS CATHETER;  Surgeon: Renford Dills, MD;  Location: [facility] INVASIVE CV LAB;  Service: Cardiovascular;  Laterality: N/A;   THORACIC LAMINECTOMY FOR EPIDURAL ABSCESS Bilateral 06/17/2020   Procedure: THORACIC LAMINECTOMY FOR EPIDURAL ABSCESS;  Surgeon: Lucy Chris, MD;  Location: [facility] ORS;  Service: Neurosurgery;  Laterality: Bilateral;    FAMILY HISTORY: Family History  Problem Relation Age of Onset   Cancer Paternal Aunt    Breast cancer Maternal Aunt 22    ADVANCED DIRECTIVES (Y/N):  N  HEALTH MAINTENANCE: Social History   Tobacco Use   Smoking status: Never  Smokeless tobacco: Never  Vaping Use   Vaping status: Never Used  Substance Use Topics   Alcohol use: Not Currently    Alcohol/week: 1.0 standard drink of alcohol    Types: 1 Cans of beer per week   Drug use: No     Colonoscopy:  PAP:  Bone density:  Lipid panel:  Allergies  Allergen Reactions   Amoxapine     Other Reaction(s):  Unknown   Latex    Ceftazidime     Other reaction(s): Confusion, Hallucination, Unknown Encephalopathy - improved after dialysis/some concern for cephalosporin neurotoxicity Tolerated without confusion 06/2021. Encephalopathy - improved after dialysis/some concern for cephalosporin neurotoxicity Tolerated without confusion 06/2021.    Iodinated Contrast Media Hives    Other Reaction(s): Hives   Penicillins     Other Reaction(s): Unknown    Current Outpatient Medications  Medication Sig Dispense Refill   acetaminophen (TYLENOL) 325 MG tablet Take 2 tablets (650 mg total) by mouth every 6 (six) hours as needed for mild pain (or Fever >/= 101).     albuterol (VENTOLIN HFA) 108 (90 Base) MCG/ACT inhaler Inhale 2 puffs into the lungs every 6 (six) hours as needed for wheezing or shortness of breath.     amitriptyline (ELAVIL) 10 MG tablet Take 10 mg by mouth at bedtime.     ascorbic acid (VITAMIN C) 500 MG tablet Take 1 tablet (500 mg total) by mouth 2 (two) times daily.     aspirin EC 81 MG tablet Take 1 tablet (81 mg total) by mouth daily. Swallow whole. 30 tablet 11   atorvastatin (LIPITOR) 20 MG tablet Take 20 mg by mouth daily.     cetirizine (ZYRTEC) 10 MG tablet Take 1 tablet by mouth daily.     Continuous Blood Gluc Sensor (DEXCOM G6 SENSOR) MISC SMARTSIG:1 Each Topical Every 10 Days     fluticasone (FLONASE) 50 MCG/ACT nasal spray 1 spray 2 (two) times daily.     folic acid (FOLVITE) 1 MG tablet Take 1 tablet by mouth daily.     gabapentin (NEURONTIN) 300 MG capsule TAKE 1 CAPSULE BY MOUTH THREE TIMES A DAY 90 capsule 3   hydrOXYzine (ATARAX) 25 MG tablet Take 1 tablet (25 mg total) by mouth 3 (three) times daily as needed. 30 tablet 0   lanthanum (FOSRENOL) 1000 MG chewable tablet Chew 1,000 mg by mouth 3 (three) times daily.     levothyroxine (SYNTHROID) 125 MCG tablet Take 125 mcg by mouth daily.     multivitamin (RENA-VIT) TABS tablet Take 1 tablet by mouth at bedtime.  0    ondansetron (ZOFRAN-ODT) 4 MG disintegrating tablet Take 1 tablet (4 mg total) by mouth every 8 (eight) hours as needed for nausea or vomiting. 20 tablet 0   propranolol (INDERAL) 20 MG tablet Take 20 mg by mouth 3 (three) times daily.     senna-docusate (SENOKOT-S) 8.6-50 MG tablet Take 2 tablets by mouth at bedtime.     sevelamer carbonate (RENVELA) 800 MG tablet Take 800 mg by mouth 3 (three) times daily.     telmisartan (MICARDIS) 80 MG tablet Take 80 mg by mouth daily.     valACYclovir (VALTREX) 500 MG tablet Take 500 mg by mouth daily.     zinc sulfate 220 (50 Zn) MG capsule Take 1 capsule (220 mg total) by mouth daily.     No current facility-administered medications for this visit.    OBJECTIVE: Vitals:   07/18/23 1510  BP: 128/89  Pulse: 92  Resp: 16  Temp: 99.7 F (37.6 C)  SpO2: 97%      Body mass index is 27.94 kg/m.    ECOG FS:0 - Asymptomatic  General: Well-developed, well-nourished, no acute distress. Eyes: Pink conjunctiva, anicteric sclera. HEENT: Normocephalic, moist mucous membranes. Lungs: No audible wheezing or coughing. Heart: Regular rate and rhythm. Abdomen: Soft, nontender, no obvious distention. Musculoskeletal: No edema, cyanosis, or clubbing. Neuro: Alert, answering all questions appropriately. Cranial nerves grossly intact. Skin: No rashes or petechiae noted. Psych: Normal affect.   LAB RESULTS:  Lab Results  Component Value Date   NA 137 07/15/2023   K 4.0 07/15/2023   CL 95 (L) 07/15/2023   CO2 29 07/15/2023   GLUCOSE 155 (H) 07/15/2023   BUN 31 (H) 07/15/2023   CREATININE 4.23 (H) 07/15/2023   CALCIUM 11.3 (H) 07/15/2023   PROT 7.9 07/15/2023   ALBUMIN 3.8 07/15/2023   AST 23 07/15/2023   ALT 16 07/15/2023   ALKPHOS 66 07/15/2023   BILITOT 0.7 07/15/2023   GFRNONAA 11 (L) 07/15/2023   GFRAA >60 01/17/2020    Lab Results  Component Value Date   WBC 4.0 07/18/2023   NEUTROABS 2.3 07/18/2023   HGB 11.0 (L) 07/18/2023   HCT  36.5 07/18/2023   MCV 95.5 07/18/2023   PLT 20 (L) 07/18/2023     STUDIES: No results found.  ASSESSMENT: CLL  PLAN:    CLL:  Patient appears to be in complete remission with no evidence of disease.  Her total white count is within normal limits.  Repeat peripheral flow cytometry is pending at time of dictation.  CT scan from December 12, 2021 did not reveal any pathologically enlarged lymphadenopathy.  She last received treatment with Rituxan and Treanda on April 09, 2015.  It does not appear her new onset thrombocytopenia is related to underlying CLL, but will consider bone marrow biopsy if no improvement in platelet count.   Anemia: Chronic and unchanged.  Patient's most recent hemoglobin is 11.0.  Recent iron stores are within normal limits.   Elevated ferritin: Likely secondary to acute phase reactant from dialysis.   Thrombocytopenia: Previously, patient's platelet count has been within normal limits other than a brief period of time in 2022 when patient was admitted to the hospital for her glomerulonephritis and initiated dialysis.  Patient's platelet count was within normal limits between August 2023 and January 2024.  Between October and December 2024 patient's platelet count declined slightly to 120s to 130s.  In the ED last week patient was noted a platelet count of 10 which improved to her current level of 20 with transfusion.  All of her laboratory work is within normal limits, SPEP and flow cytometry are pending at time of dictation.  No transfusion is needed.  Return to clinic in 1 week with repeat laboratory work and consideration of transfusion.  Will also consider bone marrow biopsy if no significant improvement.   Pruritus: Patient has no new medications or recent illnesses.  She does report a new dog at home.  Patient was given a prescription for Atarax in the hospital.   End-stage renal disease: Patient reports she receives dialysis on Monday, Wednesdays, and Fridays.  Patient  should be eligible for kidney transplant from a hematology standpoint.    Patient expressed understanding and was in agreement with this plan. She also understands that She can call clinic at any time with any questions, concerns, or complaints.    Jeralyn Ruths, MD   07/18/2023  5:29 PM

## 2023-07-19 LAB — PROTEIN ELECTROPHORESIS, SERUM
A/G Ratio: 1.3 (ref 0.7–1.7)
Albumin ELP: 4.1 g/dL (ref 2.9–4.4)
Alpha-1-Globulin: 0.2 g/dL (ref 0.0–0.4)
Alpha-2-Globulin: 0.5 g/dL (ref 0.4–1.0)
Beta Globulin: 0.7 g/dL (ref 0.7–1.3)
Gamma Globulin: 1.7 g/dL (ref 0.4–1.8)
Globulin, Total: 3.1 g/dL (ref 2.2–3.9)
Total Protein ELP: 7.2 g/dL (ref 6.0–8.5)

## 2023-07-19 LAB — COMP PANEL: LEUKEMIA/LYMPHOMA

## 2023-07-20 ENCOUNTER — Inpatient Hospital Stay

## 2023-07-21 LAB — HAPTOGLOBIN: Haptoglobin: <10 mg/dL — ABNORMAL LOW (ref 33–346)

## 2023-07-25 ENCOUNTER — Other Ambulatory Visit: Payer: Self-pay

## 2023-07-25 ENCOUNTER — Emergency Department

## 2023-07-25 ENCOUNTER — Observation Stay

## 2023-07-25 ENCOUNTER — Observation Stay
Admission: EM | Admit: 2023-07-25 | Discharge: 2023-07-27 | Disposition: A | Discharge status: Home / Self Care | Attending: Emergency Medicine | Admitting: Emergency Medicine

## 2023-07-25 DIAGNOSIS — K219 Gastro-esophageal reflux disease without esophagitis: Secondary | ICD-10-CM | POA: Diagnosis not present

## 2023-07-25 DIAGNOSIS — A6004 Herpesviral vulvovaginitis: Secondary | ICD-10-CM | POA: Diagnosis present

## 2023-07-25 DIAGNOSIS — G9341 Metabolic encephalopathy: Secondary | ICD-10-CM | POA: Diagnosis present

## 2023-07-25 DIAGNOSIS — B009 Herpesviral infection, unspecified: Secondary | ICD-10-CM

## 2023-07-25 DIAGNOSIS — R197 Diarrhea, unspecified: Secondary | ICD-10-CM | POA: Diagnosis present

## 2023-07-25 DIAGNOSIS — Z992 Dependence on renal dialysis: Secondary | ICD-10-CM | POA: Insufficient documentation

## 2023-07-25 DIAGNOSIS — J45909 Unspecified asthma, uncomplicated: Secondary | ICD-10-CM | POA: Diagnosis not present

## 2023-07-25 DIAGNOSIS — Z7901 Long term (current) use of anticoagulants: Secondary | ICD-10-CM | POA: Insufficient documentation

## 2023-07-25 DIAGNOSIS — I120 Hypertensive chronic kidney disease with stage 5 chronic kidney disease or end stage renal disease: Secondary | ICD-10-CM | POA: Insufficient documentation

## 2023-07-25 DIAGNOSIS — D61818 Other pancytopenia: Secondary | ICD-10-CM | POA: Diagnosis not present

## 2023-07-25 DIAGNOSIS — Z8659 Personal history of other mental and behavioral disorders: Secondary | ICD-10-CM | POA: Insufficient documentation

## 2023-07-25 DIAGNOSIS — E785 Hyperlipidemia, unspecified: Secondary | ICD-10-CM | POA: Diagnosis present

## 2023-07-25 DIAGNOSIS — Z1152 Encounter for screening for COVID-19: Secondary | ICD-10-CM | POA: Insufficient documentation

## 2023-07-25 DIAGNOSIS — K861 Other chronic pancreatitis: Secondary | ICD-10-CM | POA: Insufficient documentation

## 2023-07-25 DIAGNOSIS — G9340 Encephalopathy, unspecified: Secondary | ICD-10-CM

## 2023-07-25 DIAGNOSIS — E1142 Type 2 diabetes mellitus with diabetic polyneuropathy: Secondary | ICD-10-CM | POA: Diagnosis present

## 2023-07-25 DIAGNOSIS — E1169 Type 2 diabetes mellitus with other specified complication: Secondary | ICD-10-CM

## 2023-07-25 DIAGNOSIS — Z91040 Latex allergy status: Secondary | ICD-10-CM | POA: Diagnosis not present

## 2023-07-25 DIAGNOSIS — E039 Hypothyroidism, unspecified: Secondary | ICD-10-CM | POA: Diagnosis present

## 2023-07-25 DIAGNOSIS — D696 Thrombocytopenia, unspecified: Secondary | ICD-10-CM

## 2023-07-25 DIAGNOSIS — K529 Noninfective gastroenteritis and colitis, unspecified: Secondary | ICD-10-CM | POA: Diagnosis present

## 2023-07-25 DIAGNOSIS — Z79899 Other long term (current) drug therapy: Secondary | ICD-10-CM | POA: Diagnosis not present

## 2023-07-25 DIAGNOSIS — R109 Unspecified abdominal pain: Secondary | ICD-10-CM

## 2023-07-25 DIAGNOSIS — N186 End stage renal disease: Secondary | ICD-10-CM

## 2023-07-25 DIAGNOSIS — J449 Chronic obstructive pulmonary disease, unspecified: Secondary | ICD-10-CM

## 2023-07-25 LAB — COMPREHENSIVE METABOLIC PANEL WITH GFR
ALT: 21 U/L (ref 0–44)
AST: 24 U/L (ref 15–41)
Albumin: 3.8 g/dL (ref 3.5–5.0)
Alkaline Phosphatase: 58 U/L (ref 38–126)
Anion gap: 19 — ABNORMAL HIGH (ref 5–15)
BUN: 96 mg/dL — ABNORMAL HIGH (ref 6–20)
CO2: 21 mmol/L — ABNORMAL LOW (ref 22–32)
Calcium: 9.8 mg/dL (ref 8.9–10.3)
Chloride: 99 mmol/L (ref 98–111)
Creatinine, Ser: 9.72 mg/dL — ABNORMAL HIGH (ref 0.44–1.00)
GFR, Estimated: 4 mL/min — ABNORMAL LOW (ref >60)
Glucose, Bld: 133 mg/dL — ABNORMAL HIGH (ref 70–99)
Potassium: 5.5 mmol/L — ABNORMAL HIGH (ref 3.5–5.1)
Sodium: 139 mmol/L (ref 135–145)
Total Bilirubin: 0.8 mg/dL (ref 0.0–1.2)
Total Protein: 7.4 g/dL (ref 6.5–8.1)

## 2023-07-25 LAB — CBG MONITORING, ED: Glucose-Capillary: 95 mg/dL (ref 70–99)

## 2023-07-25 LAB — CBC
HCT: 38.3 % (ref 36.0–46.0)
Hemoglobin: 11.7 g/dL — ABNORMAL LOW (ref 12.0–15.0)
MCH: 28.7 pg (ref 26.0–34.0)
MCHC: 30.5 g/dL (ref 30.0–36.0)
MCV: 93.9 fL (ref 80.0–100.0)
Platelets: 42 10*3/uL — ABNORMAL LOW (ref 150–400)
RBC: 4.08 MIL/uL (ref 3.87–5.11)
RDW: 20.1 % — ABNORMAL HIGH (ref 11.5–15.5)
WBC: 5.1 10*3/uL (ref 4.0–10.5)
nRBC: 0 % (ref 0.0–0.2)

## 2023-07-25 LAB — LIPASE, BLOOD: Lipase: 96 U/L — ABNORMAL HIGH (ref 11–51)

## 2023-07-25 LAB — SARS CORONAVIRUS 2 BY RT PCR: SARS Coronavirus 2 by RT PCR: NEGATIVE

## 2023-07-25 LAB — HEPATITIS B SURFACE ANTIGEN: Hepatitis B Surface Ag: NONREACTIVE

## 2023-07-25 LAB — AMMONIA: Ammonia: 28 umol/L (ref 9–35)

## 2023-07-25 LAB — GLUCOSE, CAPILLARY: Glucose-Capillary: 117 mg/dL — ABNORMAL HIGH (ref 70–99)

## 2023-07-25 MED ORDER — PROPRANOLOL HCL 10 MG PO TABS
20.0000 mg | ORAL_TABLET | Freq: Three times a day (TID) | ORAL | Status: DC
  Administered 2023-07-25 – 2023-07-27 (×5): 20 mg via ORAL
  Filled 2023-07-25 (×5): qty 2

## 2023-07-25 MED ORDER — HYDROMORPHONE HCL 1 MG/ML IJ SOLN
0.5000 mg | INTRAMUSCULAR | Status: AC | PRN
  Administered 2023-07-25: 0.5 mg via INTRAVENOUS
  Filled 2023-07-25: qty 0.5

## 2023-07-25 MED ORDER — SODIUM CHLORIDE 0.9 % IV BOLUS
500.0000 mL | Freq: Once | INTRAVENOUS | Status: AC
  Administered 2023-07-25: 500 mL via INTRAVENOUS

## 2023-07-25 MED ORDER — DIPHENHYDRAMINE HCL 25 MG PO CAPS
25.0000 mg | ORAL_CAPSULE | Freq: Once | ORAL | Status: AC
  Administered 2023-07-25: 25 mg via ORAL
  Filled 2023-07-25: qty 1

## 2023-07-25 MED ORDER — IRBESARTAN 150 MG PO TABS
300.0000 mg | ORAL_TABLET | Freq: Every day | ORAL | Status: DC
  Administered 2023-07-25 – 2023-07-27 (×3): 300 mg via ORAL
  Filled 2023-07-25 (×4): qty 2

## 2023-07-25 MED ORDER — VALACYCLOVIR HCL 500 MG PO TABS
1000.0000 mg | ORAL_TABLET | Freq: Once | ORAL | Status: AC
  Administered 2023-07-25: 1000 mg via ORAL
  Filled 2023-07-25: qty 2

## 2023-07-25 MED ORDER — SODIUM CHLORIDE 0.9% FLUSH: 3.0000 mL | INTRAVENOUS | Status: DC | PRN

## 2023-07-25 MED ORDER — ONDANSETRON HCL 4 MG/2ML IJ SOLN
4.0000 mg | Freq: Once | INTRAMUSCULAR | Status: AC
  Administered 2023-07-25: 4 mg via INTRAVENOUS
  Filled 2023-07-25: qty 2

## 2023-07-25 MED ORDER — SEVELAMER CARBONATE 800 MG PO TABS: 2400.0000 mg | ORAL_TABLET | Freq: Three times a day (TID) | ORAL | Status: DC

## 2023-07-25 MED ORDER — ASPIRIN 81 MG PO TBEC
81.0000 mg | DELAYED_RELEASE_TABLET | Freq: Every day | ORAL | Status: DC
  Administered 2023-07-26 – 2023-07-27 (×2): 81 mg via ORAL
  Filled 2023-07-25 (×2): qty 1

## 2023-07-25 MED ORDER — VALACYCLOVIR HCL 500 MG PO TABS
500.0000 mg | ORAL_TABLET | Freq: Every day | ORAL | Status: DC
  Administered 2023-07-26 – 2023-07-27 (×2): 500 mg via ORAL
  Filled 2023-07-25 (×2): qty 1

## 2023-07-25 MED ORDER — INSULIN ASPART 100 UNIT/ML IJ SOLN
0.0000 [IU] | Freq: Three times a day (TID) | INTRAMUSCULAR | Status: DC
  Administered 2023-07-27: 2 [IU] via SUBCUTANEOUS
  Filled 2023-07-25 (×2): qty 1

## 2023-07-25 MED ORDER — DIPHENHYDRAMINE HCL 25 MG PO CAPS
50.0000 mg | ORAL_CAPSULE | Freq: Once | ORAL | Status: DC
Start: 2023-07-25 — End: 2023-07-25

## 2023-07-25 MED ORDER — HYDROXYZINE HCL 25 MG PO TABS
25.0000 mg | ORAL_TABLET | Freq: Three times a day (TID) | ORAL | Status: DC | PRN
  Administered 2023-07-26 – 2023-07-27 (×2): 25 mg via ORAL
  Filled 2023-07-25 (×2): qty 1

## 2023-07-25 MED ORDER — ALBUTEROL SULFATE (2.5 MG/3ML) 0.083% IN NEBU: 2.5000 mg | INHALATION_SOLUTION | RESPIRATORY_TRACT | Status: AC | PRN

## 2023-07-25 MED ORDER — CHLORHEXIDINE GLUCONATE CLOTH 2 % EX PADS
6.0000 | MEDICATED_PAD | Freq: Every day | CUTANEOUS | Status: DC
  Administered 2023-07-26 – 2023-07-27 (×2): 6 via TOPICAL
  Filled 2023-07-25: qty 6

## 2023-07-25 MED ORDER — VALACYCLOVIR HCL 1 G PO TABS: 1000.0000 mg | ORAL_TABLET | Freq: Three times a day (TID) | ORAL | 0 refills | Status: DC

## 2023-07-25 MED ORDER — CALCIUM ACETATE (PHOS BINDER) 667 MG PO CAPS
1334.0000 mg | ORAL_CAPSULE | Freq: Three times a day (TID) | ORAL | Status: DC
  Administered 2023-07-26 – 2023-07-27 (×4): 1334 mg via ORAL
  Filled 2023-07-25 (×4): qty 2

## 2023-07-25 MED ORDER — LIDOCAINE-PRILOCAINE 2.5-2.5 % EX CREA: 1.0000 | TOPICAL_CREAM | CUTANEOUS | Status: DC | PRN

## 2023-07-25 MED ORDER — HEPARIN SODIUM (PORCINE) 1000 UNIT/ML DIALYSIS: 1000.0000 [IU] | INTRAMUSCULAR | Status: DC | PRN

## 2023-07-25 MED ORDER — LEVOTHYROXINE SODIUM 50 MCG PO TABS
125.0000 ug | ORAL_TABLET | Freq: Every day | ORAL | Status: DC
  Administered 2023-07-26 – 2023-07-27 (×2): 125 ug via ORAL
  Filled 2023-07-25 (×2): qty 1

## 2023-07-25 MED ORDER — MORPHINE SULFATE (PF) 4 MG/ML IV SOLN
4.0000 mg | Freq: Once | INTRAVENOUS | Status: AC
  Administered 2023-07-25: 4 mg via INTRAVENOUS
  Filled 2023-07-25: qty 1

## 2023-07-25 MED ORDER — LORAZEPAM 0.5 MG PO TABS
0.5000 mg | ORAL_TABLET | ORAL | Status: AC
Start: 2023-07-25 — End: 2023-07-25
  Administered 2023-07-25: 0.5 mg via ORAL
  Filled 2023-07-25: qty 1

## 2023-07-25 MED ORDER — ACETAMINOPHEN 325 MG PO TABS
650.0000 mg | ORAL_TABLET | Freq: Four times a day (QID) | ORAL | Status: DC | PRN
  Administered 2023-07-27: 650 mg via ORAL
  Filled 2023-07-25: qty 2

## 2023-07-25 MED ORDER — RENA-VITE PO TABS
1.0000 | ORAL_TABLET | Freq: Every day | ORAL | Status: DC
  Administered 2023-07-25 – 2023-07-26 (×2): 1 via ORAL
  Filled 2023-07-25 (×3): qty 1

## 2023-07-25 MED ORDER — ONDANSETRON HCL 4 MG/2ML IJ SOLN: 4.0000 mg | Freq: Three times a day (TID) | INTRAMUSCULAR | Status: AC | PRN

## 2023-07-25 MED ORDER — PENTAFLUOROPROP-TETRAFLUOROETH EX AERO: 1.0000 | INHALATION_SPRAY | CUTANEOUS | Status: DC | PRN

## 2023-07-25 MED ORDER — SODIUM CHLORIDE 0.9% FLUSH
3.0000 mL | Freq: Two times a day (BID) | INTRAVENOUS | Status: DC
  Administered 2023-07-25 – 2023-07-27 (×4): 3 mL via INTRAVENOUS

## 2023-07-25 MED ORDER — SODIUM CHLORIDE 0.9 % IV SOLN: 250.0000 mL | INTRAVENOUS | Status: AC | PRN

## 2023-07-25 NOTE — ED Notes (Signed)
 Transported to [facility]

## 2023-07-25 NOTE — ED Notes (Signed)
 First nurse note:  BIB [facility] from home for fever and diarrhea x 2 weeks. Denies vomiting. Painful urination. Stiff neck. Mild back pain. Pt has previous HX of meningitis. CAOx4.  120HR 98% RA  131 BGL 167/85 99.6 T  Dialysis pt and last RX on Friday.

## 2023-07-25 NOTE — ED Triage Notes (Signed)
 Pt in via EMS from home for diarrhea x 2 days, reports fever at home, abdominal pain, reports she thinks she is having a herpes outbreak causing burning in vaginal area

## 2023-07-25 NOTE — Discharge Instructions (Signed)
 Take and finish Valtrex as prescribed for probable herpes outbreak.  BRAT diet x 1 week, then slowly advance diet as tolerated.  Return to the ER for worsening symptoms, persistent vomiting, difficulty breathing or other concerns.

## 2023-07-25 NOTE — ED Provider Notes (Signed)
 [facility] Provider Note    Event Date/Time   First MD Initiated Contact with Patient 07/25/23 [phone]     (approximate)   History   Diarrhea   HPI  Tonya Myers is a 58 y.o. female brought to the ED via EMS from home with a chief complaint of diarrhea x 2 days.  Patient with a history of CLL, ESRD on HD M/W/F who received dialysis on Friday.  Reports subjective fevers, abdominal pain.  Feels like she is having an herpes outbreak due to burning in her vaginal area.  Does make some urine.  Denies chest pain, shortness of breath, nausea or vomiting.     Past Medical History   Past Medical History:  Diagnosis Date   Acute hemorrhoid 03/11/2015   Anxiety    Arthritis    Asthma    Cancer (HCC)    Chronic back pain    CLL (chronic lymphocytic leukemia) (HCC)    Depression    Diabetes mellitus without complication (HCC)    Genital herpes    type 2   Hypertension    Hypothyroidism    Vertigo      Active Problem List   Patient Active Problem List   Diagnosis Date Noted   Dysarthria 03/28/2023   Uremia 03/28/2023   Stroke (HCC) 03/28/2023   Sacral decubitus ulcer, stage IV (HCC) 12/23/2021   Protein-calorie malnutrition, moderate (HCC) 12/17/2021   History of herpes genitalis 12/14/2021   Sepsis (HCC) 12/12/2021   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   Closed fracture of sternum 12/12/2021   Chronic pancreatitis (HCC) 12/12/2021   Asthma 12/12/2021   Anemia in ESRD (end-stage renal disease) (HCC) 12/12/2021   Type II diabetes mellitus with renal manifestations (HCC) 12/12/2021   UTI (urinary tract infection) 12/12/2021   Pressure injury of skin 12/11/2021   Elevated troponin 12/10/2021   Syncope 12/09/2021   Dyslipidemia 12/09/2021   Type 2 diabetes mellitus with peripheral neuropathy (HCC) 12/09/2021   GERD without esophagitis 12/09/2021   End-stage renal disease on hemodialysis (HCC) 12/09/2021   Thrombocytopenia (HCC) 12/11/2020    FUO (fever of unknown origin) 12/11/2020   Pancytopenia (HCC) 12/11/2020   Oral thrush 12/11/2020   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HCC) 12/10/2020   Right arm weakness 10/21/2020   Quadriplegia (HCC) 10/10/2020   Wheelchair dependence 10/10/2020   Transient neurologic deficit 12/15/21 10/02/2020   Functional paraparesis 10/02/2020   Left upper quadrant abdominal pain    Pain    Chronic pain of both knees    Hypokalemia    Urinary retention    Slow transit constipation    Meningitis    Labile blood glucose    Uncontrolled type 2 diabetes mellitus with hyperosmolar nonketotic hyperglycemia (HCC)    Transaminitis    Sleep disturbance    Anemia of chronic disease    Bacterial spinal epidural abscess 06/27/2020   Obesity (BMI 30.0-34.9)    Hypomagnesemia    Hypernatremia    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HCC)    Epidural abscess    Pneumococcal meningitis    Hyperglycemic crisis in diabetes mellitus (HCC) 06/10/2020   Acute metabolic encephalopathy 06/10/2020   Severe sepsis (HCC)    Diabetic ketoacidosis without coma associated with type 2 diabetes mellitus (HCC)    AKI (acute kidney injury) (HCC)    Elevated LFTs    Acute encephalopathy 06/09/2020   Bacterial vaginosis 11/01/2016   Chronic right shoulder pain 03/23/2016   Numbness and tingling 03/23/2016   Uncontrolled type 2 diabetes  mellitus with hyperglycemia, without long-term current use of insulin (HCC) 10/27/2015   DDD (degenerative disc disease), cervical 07/29/2015   Cervical disc disorder with radiculopathy of cervical region 07/29/2015   Cervical facet syndrome 07/29/2015   DDD (degenerative disc disease), lumbar 07/29/2015   Facet syndrome, lumbar 07/29/2015   Bilateral occipital neuralgia 07/29/2015   Sacroiliac joint dysfunction 07/29/2015   DJD of shoulder 07/29/2015   Herpes simplex vulvovaginitis 05/29/2015   Acute hemorrhoid 03/11/2015   Personal history of CLL (chronic lymphocytic leukemia) 01/15/2015   Hypothyroidism  12/26/2014   Arthritis 12/26/2014   Carpal tunnel syndrome, bilateral 12/26/2014   Controlled type 2 diabetes mellitus without complication, without long-term current use of insulin (HCC) 12/26/2014   Essential hypertension 12/26/2014   Hyperlipidemia, mixed 12/26/2014     Past Surgical History   Past Surgical History:  Procedure Laterality Date   ABDOMINAL HYSTERECTOMY     BILATERAL SALPINGOOPHORECTOMY  2009   BREAST BIOPSY Right 05/17/2016   FIBROADENOMATOUS CHANGE AND SCLEROSING ADENOSIS WITH   COLONOSCOPY WITH PROPOFOL N/A 06/16/2015   Procedure: COLONOSCOPY WITH PROPOFOL;  Surgeon: Elnita Maxwell, MD;  Location: [facility] ENDOSCOPY;  Service: Endoscopy;  Laterality: N/A;   DIALYSIS/PERMA CATHETER INSERTION N/A 12/17/2020   Procedure: DIALYSIS/PERMA CATHETER INSERTION;  Surgeon: Annice Needy, MD;  Location: [facility] INVASIVE CV LAB;  Service: Cardiovascular;  Laterality: N/A;   LAPAROSCOPIC SUPRACERVICAL HYSTERECTOMY  2009   due to leio   OOPHORECTOMY     TEMPORARY DIALYSIS CATHETER N/A 12/10/2020   Procedure: TEMPORARY DIALYSIS CATHETER;  Surgeon: Renford Dills, MD;  Location: [facility] INVASIVE CV LAB;  Service: Cardiovascular;  Laterality: N/A;   THORACIC LAMINECTOMY FOR EPIDURAL ABSCESS Bilateral 06/17/2020   Procedure: THORACIC LAMINECTOMY FOR EPIDURAL ABSCESS;  Surgeon: Lucy Chris, MD;  Location: [facility] ORS;  Service: Neurosurgery;  Laterality: Bilateral;     Home Medications   Prior to Admission medications   Medication Sig Start Date End Date Taking? Authorizing Provider  valACYclovir (VALTREX) 1000 MG tablet Take 1 tablet (1,000 mg total) by mouth 3 (three) times daily. 07/25/23  Yes Irean Hong, MD  acetaminophen (TYLENOL) 325 MG tablet Take 2 tablets (650 mg total) by mouth every 6 (six) hours as needed for mild pain (or Fever >/= 101). 07/30/20   Angiulli, Mcarthur Rossetti, PA-C  albuterol (VENTOLIN HFA) 108 (90 Base) MCG/ACT inhaler Inhale 2 puffs into the lungs every 6 (six)  hours as needed for wheezing or shortness of breath. 10/18/21   [provider]  amitriptyline (ELAVIL) 10 MG tablet Take 10 mg by mouth at bedtime.    [provider]  ascorbic acid (VITAMIN C) 500 MG tablet Take 1 tablet (500 mg total) by mouth 2 (two) times daily. 12/22/21   Tresa Moore, MD  aspirin EC 81 MG tablet Take 1 tablet (81 mg total) by mouth daily. Swallow whole. 10/02/20   Delfino Lovett, MD  atorvastatin (LIPITOR) 20 MG tablet Take 20 mg by mouth daily. 10/18/21   [provider]  cetirizine (ZYRTEC) 10 MG tablet Take 1 tablet by mouth daily. 07/07/23   [provider]  Continuous Blood Gluc Sensor (DEXCOM G6 SENSOR) MISC SMARTSIG:1 Each Topical Every 10 Days 08/12/20   [provider]  fluticasone (FLONASE) 50 MCG/ACT nasal spray 1 spray 2 (two) times daily.    [provider]  folic acid (FOLVITE) 1 MG tablet Take 1 tablet by mouth daily. 10/18/21   [provider]  gabapentin (NEURONTIN) 300 MG capsule  TAKE 1 CAPSULE BY MOUTH THREE TIMES A DAY 07/07/23   Felecia Shelling, DPM  hydrOXYzine (ATARAX) 25 MG tablet Take 1 tablet (25 mg total) by mouth 3 (three) times daily as needed. 07/15/23   Cameron Ali, PA-C  lanthanum (FOSRENOL) 1000 MG chewable tablet Chew 1,000 mg by mouth 3 (three) times daily.    [provider]  levothyroxine (SYNTHROID) 125 MCG tablet Take 125 mcg by mouth daily. 12/08/21   [provider]  multivitamin (RENA-VIT) TABS tablet Take 1 tablet by mouth at bedtime. 12/22/21   Sreenath, Jonelle Sports, MD  ondansetron (ZOFRAN-ODT) 4 MG disintegrating tablet Take 1 tablet (4 mg total) by mouth every 8 (eight) hours as needed for nausea or vomiting. 05/01/22   Jene Every, MD  propranolol (INDERAL) 20 MG tablet Take 20 mg by mouth 3 (three) times daily. 03/17/23 03/16/24  [provider]  senna-docusate (SENOKOT-S) 8.6-50 MG tablet Take 2 tablets by mouth at bedtime. 12/22/21    Tresa Moore, MD  sevelamer carbonate (RENVELA) 800 MG tablet Take 800 mg by mouth 3 (three) times daily. 12/08/21   [provider]  telmisartan (MICARDIS) 80 MG tablet Take 80 mg by mouth daily. 03/10/23 03/09/24  [provider]  zinc sulfate 220 (50 Zn) MG capsule Take 1 capsule (220 mg total) by mouth daily. 12/23/21   Tresa Moore, MD  budesonide-formoterol (SYMBICORT) 160-4.5 MCG/ACT inhaler Inhale into the lungs. 07/14/18 03/15/19  [provider]     Allergies  Amoxapine, Latex, Ceftazidime, Iodinated contrast media, and Penicillins   Family History   Family History  Problem Relation Age of Onset   Cancer Paternal Aunt    Breast cancer Maternal Aunt 70     Physical Exam  Triage Vital Signs: ED Triage Vitals  Encounter Vitals Group     BP 07/25/23 0053 (!) 150/81     Systolic BP Percentile --      Diastolic BP Percentile --      Pulse Rate 07/25/23 0053 (!) 117     Resp 07/25/23 0053 16     Temp 07/25/23 0053 98.7 F (37.1 C)     Temp Source 07/25/23 0053 Oral     SpO2 07/25/23 0053 97 %     Weight 07/25/23 0110 206 lb (93.4 kg)     Height 07/25/23 0110 6' (1.829 m)     Head Circumference --      Peak Flow --      Pain Score 07/25/23 0110 8     Pain Loc --      Pain Education --      Exclude from Growth Chart --     Updated Vital Signs: BP (!) 180/107   Pulse 93   Temp 98.7 F (37.1 C) (Oral)   Resp 16   Ht 6' (1.829 m)   Wt 93.4 kg   SpO2 95%   BMI 27.94 kg/m    General: Awake, mild distress.  CV:  RRR.  Good peripheral perfusion.  Resp:  Normal effort.  CTAB. Abd:  Mild diffuse tenderness to palpation without rebound or guarding.  No distention.  Other:  External vaginal exam: No vesicles visualized   ED Results / Procedures / Treatments  Labs (all labs ordered are listed, but only abnormal results are displayed) Labs Reviewed  LIPASE, BLOOD - Abnormal; Notable for the following components:       Result Value   Lipase 96 (*)    All other  components within normal limits  COMPREHENSIVE METABOLIC PANEL WITH GFR - Abnormal; Notable for the following components:   Potassium 5.5 (*)    CO2 21 (*)    Glucose, Bld 133 (*)    BUN 96 (*)    Creatinine, Ser 9.72 (*)    GFR, Estimated 4 (*)    Anion gap 19 (*)    All other components within normal limits  CBC - Abnormal; Notable for the following components:   Hemoglobin 11.7 (*)    RDW 20.1 (*)    Platelets 42 (*)    All other components within normal limits  SARS CORONAVIRUS 2 BY RT PCR  C DIFFICILE QUICK SCREEN W PCR REFLEX    GASTROINTESTINAL PANEL BY PCR, STOOL (REPLACES STOOL CULTURE)  URINALYSIS, ROUTINE W REFLEX MICROSCOPIC     EKG  None   RADIOLOGY I have independently visualized and interpreted patient's imaging study as well as noted the radiology interpretation:  CT abdomen/pelvis: No acute intra-abdominal findings, cholelithiasis, chronic pancreatitis  Official radiology report(s): CT ABDOMEN PELVIS WO CONTRAST Result Date: 07/25/2023 CLINICAL DATA:  Acute, nonlocalized abdominal pain.  Diarrhea EXAM: CT ABDOMEN AND PELVIS WITHOUT CONTRAST TECHNIQUE: Multidetector CT imaging of the abdomen and pelvis was performed following the standard protocol without IV contrast. RADIATION DOSE REDUCTION: This exam was performed according to the departmental dose-optimization program which includes automated exposure control, adjustment of the mA and/or kV according to patient size and/or use of iterative reconstruction technique. COMPARISON:  12/12/2021 FINDINGS: Lower chest:  Coronary atherosclerosis. Hepatobiliary: Low-density lesions in the subcapsular right lobe liver or subtle compared to 07/13/2021 comparison with no detected growth compatible with benign process.Physiologic distension of the gallbladder with layering gallstone. Pancreas: Scattered coarse calcifications attributed to old pancreatitis, no interval change. Spleen:  Unremarkable. Adrenals/Urinary Tract: Negative adrenals. No hydronephrosis or stone. 15 mm cyst in the interpolar left kidney. Fairly symmetric renal atrophy with right kidney measuring up to 6 cm in length. Unremarkable bladder. Stomach/Bowel:  No obstruction. No appendicitis. Vascular/Lymphatic: No acute vascular abnormality. No mass or adenopathy. Reproductive:Hysterectomy. Other: No ascites or pneumoperitoneum. Musculoskeletal: No acute abnormalities. IMPRESSION: 1. No acute finding. 2. Cholelithiasis, chronic pancreatitis, and renal atrophy. Electronically Signed   By: Tiburcio Pea M.D.   On: 07/25/2023 05:01     PROCEDURES:  Critical Care performed: No  Procedures   MEDICATIONS ORDERED IN ED: Medications  diphenhydrAMINE (BENADRYL) capsule 25 mg (has no administration in time range)  sodium chloride 0.9 % bolus 500 mL (500 mLs Intravenous New Bag/Given 07/25/23 0522)  ondansetron (ZOFRAN) injection 4 mg (4 mg Intravenous Given 07/25/23 0520)  morphine (PF) 4 MG/ML injection 4 mg (4 mg Intravenous Given 07/25/23 0521)  valACYclovir (VALTREX) tablet 1,000 mg (1,000 mg Oral Given 07/25/23 0522)     IMPRESSION / MDM / ASSESSMENT AND PLAN / ED COURSE  I reviewed the triage vital signs and the nursing notes.                             58 year old female presenting with abdominal pain and diarrhea. Differential diagnosis includes, but is not limited to, ovarian cyst, ovarian torsion, acute appendicitis, diverticulitis, urinary tract infection/pyelonephritis, endometriosis, bowel obstruction, colitis, renal colic, gastroenteritis, hernia, etc. I personally reviewed patient's records and note an oncology office visit on 07/18/2023 for thrombocytopenia.  Patient's presentation is most consistent with acute complicated illness / injury requiring diagnostic workup.  The patient is on the cardiac monitor  to evaluate for evidence of arrhythmia and/or significant heart rate changes.  Laboratory  results demonstrate normal WBC 5.1, potassium 5.5, labs consistent with renal dysfunction, mildly elevated anion gap consistent with uremia, unremarkable LFTs, minimally elevated lipase.  Platelets nearly doubled from 1 week ago.  COVID is negative.  Will obtain CT abdomen/pelvis, administer judicious fluids, morphine for pain, Zofran for nausea.  Will start Valtrex.  Clinical Course as of 07/25/23 0558  Mon Jul 25, 2023  0535 CT negative for acute abnormality.  Will discharge home on Valtrex, Benadryl given for generalized itching times several weeks.  Patient missed her 5 AM dialysis; charge nurse to call to arrange EMS transport to dialysis center.  Strict return precautions given.  Patient verbalizes understanding and agrees with plan of care. [JS]  [id number] Patient too weak to get out of bed, requiring heavy assistance.  Will consult hospitalist services for evaluation and admission. [JS]    Clinical Course User Index [JS] Irean Hong, MD     FINAL CLINICAL IMPRESSION(S) / ED DIAGNOSES   Final diagnoses:  Diarrhea, unspecified type  Abdominal pain, unspecified abdominal location  Herpes  Thrombocytopenia (HCC)     Rx / DC Orders   ED Discharge Orders          Ordered    valACYclovir (VALTREX) 1000 MG tablet  3 times daily        07/25/23 0535             Note:  This document was prepared using Dragon voice recognition software and may include unintentional dictation errors.   Irean Hong, MD 07/25/23 [phone]

## 2023-07-25 NOTE — ED Notes (Signed)
 Pt stating to this EDT that she is confused and can't getting her words out right. Pt tearful.

## 2023-07-25 NOTE — Assessment & Plan Note (Addendum)
 Acute diarrhea times roughly 7 days Minimal abdominal pain No reported bloody or black stools ? Recurring issue  GI panel pending Monitor Consider outpt GI eval if symtpoms persist.

## 2023-07-25 NOTE — H&P (Addendum)
 History and Physical    Patient: Tonya Myers EXB:[id number] DOB: 22-Jun-1965 DOA: 07/25/2023 DOS: the patient was seen and examined on 07/25/2023 PCP: [name], MD  Patient coming from: Home  Chief Complaint:  Chief Complaint  Patient presents with   Diarrhea   HPI: Tonya Myers is a 58 y.o. female with medical history significant of ESRD on HD MWF, COPD, cervical radiculopathy, lumbar spine radiculopathy, IIDM, anxiety/depression, hypothyroidism coming in with diarrhea, encephalopathy, genital herpes.  Limited history in setting of encephalopathy.  Patient reports recurring diarrhea over the past 1 week or so.  Bowel movements somewhat watery.  No reported black or bloody stools.  Minimal abdominal pain.  No chest pain or shortness of breath.  Denies any recent dietary changes.  No reported recent antibiotic use.  Also with worsening weakness.  Baseline ESRD.  On hemodialysis Monday Wednesday Friday.  Last session was Friday per patient.  Has had some intermittent word finding difficulties.  No focal hemiparesis.  Minimal confusion.  No fevers or chills.  No reported recent medication changes.  Minimal headache.  Positive burning in vaginal area in the setting history of herpes. Presented to the ER afebrile, hemodynamically stable.  Satting well on room air.  White count 5.1, hemoglobin 11.7, platelets 42, creatinine 9.72, BUN 96.  Potassium 5.5.  COVID-negative.  CT of the abdomen pelvis grossly within normal limits. Review of Systems: As mentioned in the history of present illness. All other systems reviewed and are negative. Past Medical History:  Diagnosis Date   Acute hemorrhoid 03/11/2015   Anxiety    Arthritis    Asthma    Cancer (HCC)    Chronic back pain    CLL (chronic lymphocytic leukemia) (HCC)    Depression    Diabetes mellitus without complication (HCC)    Genital herpes    type 2   Hypertension    Hypothyroidism    Vertigo    Past Surgical  History:  Procedure Laterality Date   ABDOMINAL HYSTERECTOMY     BILATERAL SALPINGOOPHORECTOMY  2009   BREAST BIOPSY Right 05/17/2016   FIBROADENOMATOUS CHANGE AND SCLEROSING ADENOSIS WITH   COLONOSCOPY WITH PROPOFOL N/A 06/16/2015   Procedure: COLONOSCOPY WITH PROPOFOL;  Surgeon: Elnita Maxwell, MD;  Location[facility] ENDOSCOPY;  Service: Endoscopy;  Laterality: N/A;   DIALYSIS/PERMA CATHETER INSERTION N/A 12/17/2020   Procedure: DIALYSIS/PERMA CATHETER INSERTION;  Surgeon: Annice Needy, MD;  Location: [facility] INVASIVE CV LAB;  Service: Cardiovascular;  Laterality: N/A;   LAPAROSCOPIC SUPRACERVICAL HYSTERECTOMY  2009   due to leio   OOPHORECTOMY     TEMPORARY DIALYSIS CATHETER N/A 12/10/2020   Procedure: TEMPORARY DIALYSIS CATHETER;  Surgeon: Renford Dills, MD;  Location: [facility] INVASIVE CV LAB;  Service: Cardiovascular;  Laterality: N/A;   THORACIC LAMINECTOMY FOR EPIDURAL ABSCESS Bilateral 06/17/2020   Procedure: THORACIC LAMINECTOMY FOR EPIDURAL ABSCESS;  Surgeon: Lucy Chris, MD;  Location: [facility] ORS;  Service: Neurosurgery;  Laterality: Bilateral;   Social History:  reports that she has never smoked. She has never used smokeless tobacco. She reports that she does not currently use alcohol after a past usage of about 1.0 standard drink of alcohol per week. She reports that she does not use drugs.  Allergies  Allergen Reactions   Amoxapine     Other Reaction(s): Unknown   Latex    Ceftazidime     Other reaction(s): Confusion, Hallucination, Unknown Encephalopathy - improved after dialysis/some concern for cephalosporin neurotoxicity Tolerated without confusion 06/2021. Encephalopathy -  improved after dialysis/some concern for cephalosporin neurotoxicity Tolerated without confusion 06/2021.    Iodinated Contrast Media Hives    Other Reaction(s): Hives   Penicillins     Other Reaction(s): Unknown    Family History  Problem Relation Age of Onset   Cancer Paternal Aunt    Breast  cancer Maternal Aunt 8    Prior to Admission medications   Medication Sig Start Date End Date Taking? Authorizing Provider  valACYclovir (VALTREX) 1000 MG tablet Take 1 tablet (1,000 mg total) by mouth 3 (three) times daily. 07/25/23  Yes Irean Hong, MD  acetaminophen (TYLENOL) 325 MG tablet Take 2 tablets (650 mg total) by mouth every 6 (six) hours as needed for mild pain (or Fever >/= 101). 07/30/20   Angiulli, Mcarthur Rossetti, PA-C  albuterol (VENTOLIN HFA) 108 (90 Base) MCG/ACT inhaler Inhale 2 puffs into the lungs every 6 (six) hours as needed for wheezing or shortness of breath. 10/18/21   [provider]  amitriptyline (ELAVIL) 10 MG tablet Take 10 mg by mouth at bedtime.    [provider]  ascorbic acid (VITAMIN C) 500 MG tablet Take 1 tablet (500 mg total) by mouth 2 (two) times daily. 12/22/21   Tresa Moore, MD  aspirin EC 81 MG tablet Take 1 tablet (81 mg total) by mouth daily. Swallow whole. 10/02/20   Delfino Lovett, MD  atorvastatin (LIPITOR) 20 MG tablet Take 20 mg by mouth daily. 10/18/21   [provider]  cetirizine (ZYRTEC) 10 MG tablet Take 1 tablet by mouth daily. 07/07/23   [provider]  Continuous Blood Gluc Sensor (DEXCOM G6 SENSOR) MISC SMARTSIG:1 Each Topical Every 10 Days 08/12/20   [provider]  fluticasone (FLONASE) 50 MCG/ACT nasal spray 1 spray 2 (two) times daily.    [provider]  folic acid (FOLVITE) 1 MG tablet Take 1 tablet by mouth daily. 10/18/21   [provider]  gabapentin (NEURONTIN) 300 MG capsule TAKE 1 CAPSULE BY MOUTH THREE TIMES A DAY 07/07/23   Felecia Shelling, DPM  hydrOXYzine (ATARAX) 25 MG tablet Take 1 tablet (25 mg total) by mouth 3 (three) times daily as needed. 07/15/23   Cameron Ali, PA-C  lanthanum (FOSRENOL) 1000 MG chewable tablet Chew 1,000 mg by mouth 3 (three) times daily.    [provider]  levothyroxine (SYNTHROID) 125 MCG tablet Take 125 mcg by mouth daily.  12/08/21   [provider]  multivitamin (RENA-VIT) TABS tablet Take 1 tablet by mouth at bedtime. 12/22/21   Sreenath, Jonelle Sports, MD  ondansetron (ZOFRAN-ODT) 4 MG disintegrating tablet Take 1 tablet (4 mg total) by mouth every 8 (eight) hours as needed for nausea or vomiting. 05/01/22   Jene Every, MD  propranolol (INDERAL) 20 MG tablet Take 20 mg by mouth 3 (three) times daily. 03/17/23 03/16/24  [provider]  senna-docusate (SENOKOT-S) 8.6-50 MG tablet Take 2 tablets by mouth at bedtime. 12/22/21   Tresa Moore, MD  sevelamer carbonate (RENVELA) 800 MG tablet Take 800 mg by mouth 3 (three) times daily. 12/08/21   [provider]  telmisartan (MICARDIS) 80 MG tablet Take 80 mg by mouth daily. 03/10/23 03/09/24  [provider]  zinc sulfate 220 (50 Zn) MG capsule Take 1 capsule (220 mg total) by mouth daily. 12/23/21   Tresa Moore, MD  budesonide-formoterol (SYMBICORT) 160-4.5 MCG/ACT inhaler Inhale into the lungs. 07/14/18 03/15/19  [provider]    Physical Exam: Vitals:  07/25/23 0640 07/25/23 0645 07/25/23 0701 07/25/23 0730  BP: (!) 189/97   (!) 185/97  Pulse: 97   (!) 103  Resp: 16   11  Temp:   97.8 F (36.6 C)   TempSrc:   Oral   SpO2:  98%  96%  Weight:      Height:       Physical Exam Constitutional:      Comments: Mildly lethargic on exam  HENT:     Head: Normocephalic and atraumatic.     Mouth/Throat:     Mouth: Mucous membranes are moist.  Eyes:     Pupils: Pupils are equal, round, and reactive to light.  Neck:     Comments: No noted focal nuchal rigidity or neck stiffness on evaluation Cardiovascular:     Rate and Rhythm: Normal rate and regular rhythm.  Pulmonary:     Effort: Pulmonary effort is normal.  Abdominal:     General: Bowel sounds are normal.  Musculoskeletal:        General: Normal range of motion.  Skin:    General: Skin is warm.  Neurological:     General: No focal deficit  present.  Psychiatric:        Mood and Affect: Mood normal.     Data Reviewed:  There are no new results to review at this time.  CT ABDOMEN PELVIS WO CONTRAST CLINICAL DATA:  Acute, nonlocalized abdominal pain.  Diarrhea  EXAM: CT ABDOMEN AND PELVIS WITHOUT CONTRAST  TECHNIQUE: Multidetector CT imaging of the abdomen and pelvis was performed following the standard protocol without IV contrast.  RADIATION DOSE REDUCTION: This exam was performed according to the departmental dose-optimization program which includes automated exposure control, adjustment of the mA and/or kV according to patient size and/or use of iterative reconstruction technique.  COMPARISON:  12/12/2021  FINDINGS: Lower chest:  Coronary atherosclerosis.  Hepatobiliary: Low-density lesions in the subcapsular right lobe liver or subtle compared to 07/13/2021 comparison with no detected growth compatible with benign process.Physiologic distension of the gallbladder with layering gallstone.  Pancreas: Scattered coarse calcifications attributed to old pancreatitis, no interval change.  Spleen: Unremarkable.  Adrenals/Urinary Tract: Negative adrenals. No hydronephrosis or stone. 15 mm cyst in the interpolar left kidney. Fairly symmetric renal atrophy with right kidney measuring up to 6 cm in length. Unremarkable bladder.  Stomach/Bowel:  No obstruction. No appendicitis.  Vascular/Lymphatic: No acute vascular abnormality. No mass or adenopathy.  Reproductive:Hysterectomy.  Other: No ascites or pneumoperitoneum.  Musculoskeletal: No acute abnormalities.  IMPRESSION: 1. No acute finding. 2. Cholelithiasis, chronic pancreatitis, and renal atrophy.  Electronically Signed   By: Tiburcio Pea M.D.   On: 07/25/2023 05:01  Lab Results  Component Value Date   WBC 5.1 07/25/2023   HGB 11.7 (L) 07/25/2023   HCT 38.3 07/25/2023   MCV 93.9 07/25/2023   PLT 42 (L) 07/25/2023   Last metabolic  panel Lab Results  Component Value Date   GLUCOSE 133 (H) 07/25/2023   NA 139 07/25/2023   K 5.5 (H) 07/25/2023   CL 99 07/25/2023   CO2 21 (L) 07/25/2023   BUN 96 (H) 07/25/2023   CREATININE 9.72 (H) 07/25/2023   GFRNONAA 4 (L) 07/25/2023   CALCIUM 9.8 07/25/2023   PHOS 7.4 (H) 03/30/2023   PROT 7.4 07/25/2023   ALBUMIN 3.8 07/25/2023   LABGLOB 3.1 07/15/2023   AGRATIO 1.3 07/15/2023   BILITOT 0.8 07/25/2023   ALKPHOS 58 07/25/2023   AST 24 07/25/2023   ALT  21 07/25/2023   ANIONGAP 19 (H) 07/25/2023    Assessment and Plan: * Diarrhea Acute diarrhea times roughly 7 days Minimal abdominal pain No reported bloody or black stools ? Recurring issue  GI panel pending Monitor Consider outpt GI eval if symtpoms persist.   Acute metabolic encephalopathy Positive generalized lethargy on presentation in setting of ESRD on hemodialysis Mild confusion and?  Word finding issues-unclear baseline Grossly nonfocal neuroexam BUN 96 Will hold sedating medications Check ammonia level MRI of the brain x 1 Monitor  End-stage renal disease on hemodialysis (HCC) Baseline ESRD on hemodialysis Monday Wednesday Friday Due for dialysis today Consult nephrology  Type 2 diabetes mellitus with peripheral neuropathy (HCC) Glucose 130s  SSI  Monitor    Herpes simplex vulvovaginitis Pt reports acute flare  Will start on valtrex  Monitor    Hypothyroidism Continue synthroid    Dyslipidemia Cont statin    Asthma Stable from a resp standpoint  Cont home inhalers    GERD without esophagitis PPI    Pancytopenia (HCC) WBC 5.1, hgb 11.7, plt 40( above baseline)  Continue vitamin supplementation Hold AC Monitor        Advance Care Planning:   Code Status: Full Code   Consults: Nephrology   Family Communication: No family at the bedside   Severity of Illness: The appropriate patient status for this patient is OBSERVATION. Observation status is judged to be  reasonable and necessary in order to provide the required intensity of service to ensure the patient's safety. The patient's presenting symptoms, physical exam findings, and initial radiographic and laboratory data in the context of their medical condition is felt to place them at decreased risk for further clinical deterioration. Furthermore, it is anticipated that the patient will be medically stable for discharge from the hospital within 2 midnights of admission.   Author: Floydene Flock, MD 07/25/2023 8:05 AM  For on call review [email or web address].

## 2023-07-25 NOTE — Assessment & Plan Note (Signed)
 Pt reports acute flare  Will start on valtrex  Monitor

## 2023-07-25 NOTE — Assessment & Plan Note (Signed)
 Continue synthroid.

## 2023-07-25 NOTE — ED Notes (Signed)
 Called lab due to pt being a difficult stick. Lab REFUSED to come draw pts blood. Unable to obtain blood work due to this.

## 2023-07-25 NOTE — Assessment & Plan Note (Addendum)
 Positive generalized lethargy on presentation in setting of ESRD on hemodialysis Mild confusion and?  Word finding issues-unclear baseline Grossly nonfocal neuroexam BUN 96 Will hold sedating medications Check ammonia level MRI of the brain x 1 Monitor

## 2023-07-25 NOTE — ED Notes (Signed)
 Triage entered by this RN mistakenly under ED Tech Arianne. This RN tapped badge but it did not log out previous user.

## 2023-07-25 NOTE — ED Notes (Signed)
 Pt transported to dialysis

## 2023-07-25 NOTE — Progress Notes (Signed)
 [facility]  ROUNDING NOTE   Subjective:   Tonya Myers is a 58 year old female with past medical history including depression, type 2 diabetes, asthma, hypertension, hypothyroidism, CLL, and end-stage renal disease on hemodialysis.  She presents to the emergency department with fever, abdominal pain, and diarrhea.  She has been admitted under observation for Herpes [B00.9] Acute diarrhea [R19.7] Thrombocytopenia (HCC) [D69.6] Abdominal pain, unspecified abdominal location [R10.9] Diarrhea, unspecified type [R19.7]   Patient is known our practice and receives outpatient dialysis treatments at [facility] on a MWF schedule, supervised by [facility] physicians.  Last treatment received on Friday.  Patient seen laying on stretcher in emergency department.  No family present.  Patient appears very altered, garbled, incoherent speech.  Does recognize this provider.   Patient received Dilaudid, Benadryl, Ativan, morphine, and Zofran within 3 hours time.  Labs on ED arrival include potassium 5.5, serum bicarb 21, BUN 96, creatinine 9.72 with GFR 4, and hemoglobin 11.7.  CT abdomen pelvis negative for acute findings.  Brain MRI also negative.  GI panel ordered.  We have been consulted to manage dialysis needs during this admission   Objective:  Vital signs in last 24 hours:  Temp:  [97.8 F (36.6 C)-98.7 F (37.1 C)] 98 F (36.7 C) (03/31 1246) Pulse Rate:  [87-117] 95 (03/31 1310) Resp:  [11-16] 12 (03/31 1310) BP: (113-196)/(69-152) 187/152 (03/31 1310) SpO2:  [95 %-100 %] 99 % (03/31 1310) Weight:  [93.4 kg-98 kg] 98 kg (03/31 1246)  Weight change:  Filed Weights   07/25/23 0110 07/25/23 1246  Weight: 93.4 kg 98 kg    Intake/Output: No intake/output data recorded.   Intake/Output this shift:  Total I/O In: 500 [IV Piggyback:500] Out: -   Physical Exam: General: NAD  Head: Normocephalic, atraumatic. Moist oral mucosal membranes  Eyes: Anicteric   Lungs:  Clear to auscultation, normal effort  Heart: Regular rate and rhythm  Abdomen:  Soft, nontender,   Extremities:  No peripheral edema.  Neurologic: Oriented to self, moving all four extremities  Skin: No lesions  Access: Lt AVG    Basic Metabolic Panel: Recent Labs  Lab 07/25/23 0142  NA 139  K 5.5*  CL 99  CO2 21*  GLUCOSE 133*  BUN 96*  CREATININE 9.72*  CALCIUM 9.8    Liver Function Tests: Recent Labs  Lab 07/25/23 0142  AST 24  ALT 21  ALKPHOS 58  BILITOT 0.8  PROT 7.4  ALBUMIN 3.8   Recent Labs  Lab 07/25/23 0142  LIPASE 96*   Recent Labs  Lab 07/25/23 0925  AMMONIA 28    CBC: Recent Labs  Lab 07/18/23 1457 07/25/23 0142  WBC 4.0 5.1  NEUTROABS 2.3  --   HGB 11.0* 11.7*  HCT 36.5 38.3  MCV 95.5 93.9  PLT 20* 42*    Cardiac Enzymes: No results for input(s): "CKTOTAL", "CKMB", "CKMBINDEX", "TROPONINI" in the last 168 hours.  BNP: Invalid input(s): "POCBNP"  CBG: Recent Labs  Lab 07/25/23 1144  GLUCAP 95    Microbiology: Results for orders placed or performed during the hospital encounter of 07/25/23  SARS Coronavirus 2 by RT PCR (hospital order, performed in [facility] hospital lab) *cepheid single result test* Anterior Nasal Swab     Status: None   Collection Time: 07/25/23  1:30 AM   Specimen: Anterior Nasal Swab  Result Value Ref Range Status   SARS Coronavirus 2 by RT PCR NEGATIVE NEGATIVE Final    Comment: Performed at [facility], [address] [place], [place] [address]    Coagulation Studies: No results for input(s): "LABPROT", "INR" in the last 72 hours.  Urinalysis: No results for input(s): "COLORURINE", "LABSPEC", "PHURINE", "GLUCOSEU", "HGBUR", "BILIRUBINUR", "KETONESUR", "PROTEINUR", "UROBILINOGEN", "NITRITE", "LEUKOCYTESUR" in the last 72 hours.  Invalid input(s): "APPERANCEUR"    Imaging: MR BRAIN WO CONTRAST Result Date: 07/25/2023 CLINICAL DATA:  58 year old female with syncope,  neurologic deficit, diarrhea. EXAM: MRI HEAD WITHOUT CONTRAST TECHNIQUE: Multiplanar, multiecho pulse sequences of the brain and surrounding structures were obtained without intravenous contrast. COMPARISON:  Brain MRI 03/29/2023 and earlier. FINDINGS: Brain: Overall cerebral volume remains normal for age. No restricted diffusion to suggest acute infarction. No midline shift, mass effect, evidence of mass lesion, ventriculomegaly, extra-axial collection or acute intracranial hemorrhage. Cervicomedullary junction and pituitary are within normal limits. No cortical encephalomalacia or chronic cerebral blood products identified. Patchy moderate for age and nonspecific cerebral white matter T2 and FLAIR hyperintensity is stable. Deep gray nuclei, brainstem and cerebellum remain within normal limits. Vascular: Major intracranial vascular flow voids are preserved, stable since December. Skull and upper cervical spine: Partially visible cervical spine degeneration. Background bone marrow signal remains within normal limits. Sinuses/Orbits: Stable, negative. Other: Grossly normal visible internal auditory structures. Negative visible scalp and face. IMPRESSION: 1. No acute intracranial abnormality. 2. Stable moderate for age nonspecific cerebral white matter signal changes, most commonly due to small vessel disease. Electronically Signed   By: Odessa Fleming M.D.   On: 07/25/2023 09:03   CT ABDOMEN PELVIS WO CONTRAST Result Date: 07/25/2023 CLINICAL DATA:  Acute, nonlocalized abdominal pain.  Diarrhea EXAM: CT ABDOMEN AND PELVIS WITHOUT CONTRAST TECHNIQUE: Multidetector CT imaging of the abdomen and pelvis was performed following the standard protocol without IV contrast. RADIATION DOSE REDUCTION: This exam was performed according to the departmental dose-optimization program which includes automated exposure control, adjustment of the mA and/or kV according to patient size and/or use of iterative reconstruction technique.  COMPARISON:  12/12/2021 FINDINGS: Lower chest:  Coronary atherosclerosis. Hepatobiliary: Low-density lesions in the subcapsular right lobe liver or subtle compared to 07/13/2021 comparison with no detected growth compatible with benign process.Physiologic distension of the gallbladder with layering gallstone. Pancreas: Scattered coarse calcifications attributed to old pancreatitis, no interval change. Spleen: Unremarkable. Adrenals/Urinary Tract: Negative adrenals. No hydronephrosis or stone. 15 mm cyst in the interpolar left kidney. Fairly symmetric renal atrophy with right kidney measuring up to 6 cm in length. Unremarkable bladder. Stomach/Bowel:  No obstruction. No appendicitis. Vascular/Lymphatic: No acute vascular abnormality. No mass or adenopathy. Reproductive:Hysterectomy. Other: No ascites or pneumoperitoneum. Musculoskeletal: No acute abnormalities. IMPRESSION: 1. No acute finding. 2. Cholelithiasis, chronic pancreatitis, and renal atrophy. Electronically Signed   By: Tiburcio Pea M.D.   On: 07/25/2023 05:01     Medications:    sodium chloride      aspirin EC  81 mg Oral Daily   Chlorhexidine Gluconate Cloth  6 each Topical Q0600   insulin aspart  0-9 Units Subcutaneous TID WC   irbesartan  300 mg Oral Daily   levothyroxine  125 mcg Oral Q0600   multivitamin  1 tablet Oral QHS   propranolol  20 mg Oral TID   sevelamer carbonate  800 mg Oral TID   sodium chloride flush  3 mL Intravenous Q12H   [START ON 07/26/2023] valACYclovir  500 mg Oral Daily   sodium chloride, acetaminophen, albuterol, heparin, HYDROmorphone (DILAUDID) injection, hydrOXYzine, lidocaine-prilocaine, ondansetron (ZOFRAN) IV, pentafluoroprop-tetrafluoroeth, sodium chloride flush  Assessment/ Plan:  Ms. Tonya  Danniel Myers is a 58 y.o.  female  with past medical history including depression, type 2 diabetes, asthma, hypertension, hypothyroidism, CLL, and end-stage renal disease on hemodialysis.  She presents to  the emergency department with fever and diarrhea. She has been admitted under observation for Herpes [B00.9] Acute diarrhea [R19.7] Thrombocytopenia (HCC) [D69.6] Abdominal pain, unspecified abdominal location [R10.9] Diarrhea, unspecified type [R19.7]   [facility] [place]/MWF/right AVF/91 kg   Hyperkalemia with end stage renal disease on hemodialysis. Last treatment received on Friday. Potassium 5.5. Will correct with dialysis treatment today. Due to recent history of diarrhea, will perform dialysis with no UF. Next treatment scheduled for Wednesday.   2. 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  Lab Results  Component Value Date   HGB 11.7 (L) 07/25/2023    Thrombocytopenia noted outpatient also. Hgb acceptable. Mircera prescribed at outpatient clinic.   3. Secondary Hyperparathyroidism: with outpatient labs: PTH 223, phosphorus 6.5, calcium 9.8 on 06/27/23.   Lab Results  Component Value Date   CALCIUM 9.8 07/25/2023   PHOS 7.4 (H) 03/30/2023   Currently prescribed calcitriol and calcium acetate outpatient. Will continue to monitor bone minerals.   4. Acute metabolic encephalopathy. Rambling, incoherent speech, random gross movements. MAR review concerning for pain, anxiety, and antiemetic medications received in close proximity to renal patient. Ammonia level and imaging negative. Will assess after dialysis.     LOS: 0 Tonya Myers 3/31/20251:22 PM

## 2023-07-25 NOTE — Assessment & Plan Note (Signed)
 Stable from a resp standpoint  Cont home inhalers

## 2023-07-25 NOTE — Assessment & Plan Note (Signed)
 Baseline ESRD on hemodialysis Monday Wednesday Friday Due for dialysis today Consult nephrology

## 2023-07-25 NOTE — Assessment & Plan Note (Addendum)
 WBC 5.1, hgb 11.7, plt 40( above baseline)  Continue vitamin supplementation Hold AC Monitor

## 2023-07-25 NOTE — ED Notes (Signed)
 This RN called lab for collection. Lab stated "There are only two of [place] down here tonight and the other lady has been running around in circles. I will see if she can come down there".

## 2023-07-25 NOTE — Progress Notes (Signed)
 Hemodialysis note  Received patient in bed to unit. Alert and oriented x3.  Informed consent signed and in chart.  Tx duration: 3.5 hours  Patient tolerated well. Transported back to room, alert without acute distress.  Report given to patient's RN.   Access used: RUA AVG Access issues: none  Total UF removed: 0 Medication(s) given:  none  Post HD weight: 98 kg   [facility]

## 2023-07-25 NOTE — Assessment & Plan Note (Signed)
 Cont statin

## 2023-07-25 NOTE — Assessment & Plan Note (Signed)
 PPI ?

## 2023-07-25 NOTE — Assessment & Plan Note (Signed)
 Glucose 130s  SSI  Monitor

## 2023-07-26 ENCOUNTER — Ambulatory Visit: Payer: Self-pay | Admitting: Internal Medicine

## 2023-07-26 DIAGNOSIS — R197 Diarrhea, unspecified: Secondary | ICD-10-CM | POA: Diagnosis not present

## 2023-07-26 LAB — COMPREHENSIVE METABOLIC PANEL WITH GFR
ALT: 16 U/L (ref 0–44)
AST: 23 U/L (ref 15–41)
Albumin: 3.5 g/dL (ref 3.5–5.0)
Alkaline Phosphatase: 58 U/L (ref 38–126)
Anion gap: 12 (ref 5–15)
BUN: 45 mg/dL — ABNORMAL HIGH (ref 6–20)
CO2: 27 mmol/L (ref 22–32)
Calcium: 8.6 mg/dL — ABNORMAL LOW (ref 8.9–10.3)
Chloride: 95 mmol/L — ABNORMAL LOW (ref 98–111)
Creatinine, Ser: 6.43 mg/dL — ABNORMAL HIGH (ref 0.44–1.00)
GFR, Estimated: 7 mL/min — ABNORMAL LOW (ref >60)
Glucose, Bld: 94 mg/dL (ref 70–99)
Potassium: 5 mmol/L (ref 3.5–5.1)
Sodium: 134 mmol/L — ABNORMAL LOW (ref 135–145)
Total Bilirubin: 0.9 mg/dL (ref 0.0–1.2)
Total Protein: 7.1 g/dL (ref 6.5–8.1)

## 2023-07-26 LAB — GLUCOSE, CAPILLARY
Glucose-Capillary: 85 mg/dL (ref 70–99)
Glucose-Capillary: 145 mg/dL — ABNORMAL HIGH (ref 70–99)
Glucose-Capillary: 122 mg/dL — ABNORMAL HIGH (ref 70–99)
Glucose-Capillary: 131 mg/dL — ABNORMAL HIGH (ref 70–99)

## 2023-07-26 LAB — URINALYSIS, ROUTINE W REFLEX MICROSCOPIC
Bilirubin Urine: NEGATIVE
Glucose, UA: NEGATIVE mg/dL
Ketones, ur: NEGATIVE mg/dL
Nitrite: NEGATIVE
Protein, ur: 100 mg/dL — AB
Specific Gravity, Urine: 1.008 (ref 1.005–1.030)
WBC, UA: >50 WBC/hpf (ref 0–5)
pH: 9 — ABNORMAL HIGH (ref 5.0–8.0)

## 2023-07-26 LAB — CBC
HCT: 35.3 % — ABNORMAL LOW (ref 36.0–46.0)
Hemoglobin: 11.2 g/dL — ABNORMAL LOW (ref 12.0–15.0)
MCH: 29.1 pg (ref 26.0–34.0)
MCHC: 31.7 g/dL (ref 30.0–36.0)
MCV: 91.7 fL (ref 80.0–100.0)
Platelets: 35 10*3/uL — ABNORMAL LOW (ref 150–400)
RBC: 3.85 MIL/uL — ABNORMAL LOW (ref 3.87–5.11)
RDW: 20.4 % — ABNORMAL HIGH (ref 11.5–15.5)
WBC: 3.1 10*3/uL — ABNORMAL LOW (ref 4.0–10.5)
nRBC: 0 % (ref 0.0–0.2)

## 2023-07-26 MED ORDER — PANCRELIPASE (LIP-PROT-AMYL) 12000-38000 UNITS PO CPEP
12000.0000 [IU] | ORAL_CAPSULE | Freq: Three times a day (TID) | ORAL | Status: DC
  Administered 2023-07-26 – 2023-07-27 (×2): 12000 [IU] via ORAL
  Filled 2023-07-26 (×2): qty 1

## 2023-07-26 NOTE — Care Management Obs Status (Signed)
 MEDICARE OBSERVATION STATUS NOTIFICATION   Patient Details  Name: Tonya Myers MRN: [id number] Date of Birth: 1965-10-12   Medicare Observation Status Notification Given:  Orland Dec, CMA 07/26/2023, 11:12 AM

## 2023-07-26 NOTE — Evaluation (Signed)
 Physical Therapy Evaluation Patient Details Name: Tonya Myers MRN: [id number] DOB: 1966/01/24 Today's Date: 07/26/2023  History of Present Illness  Pt admitted for diarrhea with complaints of fever. History includes ESRD, HD MWF, COPD, radiculopathy, anxiety, and depression.  Clinical Impression  Pt is a pleasant 59 year old female who was admitted for diarrhea. Pt performs bed mobility with mod I, transfers with cga, and ambulation with cga and RW. Pt demonstrates deficits with strength in B LE primarily in DF/PF. Agreeable to sit in recliner at end of session. Educated to continue use of RW at this time for safety. Would benefit from skilled PT to address above deficits and promote optimal return to PLOF. Pt will continue to receive skilled PT services while admitted and will defer to TOC/care team for updates regarding disposition planning.         If plan is discharge home, recommend the following: A little help with walking and/or transfers;A little help with bathing/dressing/bathroom;Help with stairs or ramp for entrance   Can travel by private vehicle        Equipment Recommendations Rolling walker (2 wheels)  Recommendations for Other Services       Functional Status Assessment Patient has had a recent decline in their functional status and demonstrates the ability to make significant improvements in function in a reasonable and predictable amount of time.     Precautions / Restrictions Precautions Precautions: Fall Recall of Precautions/Restrictions: Intact Restrictions Weight Bearing Restrictions Per Provider Order: No      Mobility  Bed Mobility Overal bed mobility: Modified Independent             General bed mobility comments: safe technique with ease of mobility    Transfers Overall transfer level: Needs assistance Equipment used: 1 person hand held assist Transfers: Sit to/from Stand Sit to Stand: Contact guard assist           General  transfer comment: slightly unsteady with single assist. Cues for safety and given RW for increased stability    Ambulation/Gait Ambulation/Gait assistance: Contact guard assist Gait Distance (Feet): 200 Feet Assistive device: Rolling walker (2 wheels) Gait Pattern/deviations: Step-through pattern       General Gait Details: decreased foot clearance in B LE. RW used. Unsteadiness and cues for staying close to [employer] Mobility     Tilt Bed    Modified Rankin (Stroke Patients Only)       Balance Overall balance assessment: Needs assistance, History of Falls Sitting-balance support: Feet supported, Bilateral upper extremity supported Sitting balance-Leahy Scale: Good     Standing balance support: Bilateral upper extremity supported Standing balance-Leahy Scale: Fair                               Pertinent Vitals/Pain Pain Assessment Pain Assessment: No/denies pain    Home Living Family/patient expects to be discharged to:: Private residence Living Arrangements: Alone Available Help at Discharge: Family;Available PRN/intermittently Type of Home: House Home Access: Stairs to enter (does have access to enter home that is level entry) Entrance Stairs-Rails: None Entrance Stairs-Number of Steps: 2 Alternate Level Stairs-Number of Steps: flight to 2nd floor bedroom Home Layout: Two level;Able to live on main level with bedroom/bathroom Home Equipment: Grab bars - tub/shower;Cane - single point Additional Comments: reports having WC at one time, but is unsure where it is located  Prior Function Prior Level of Function : Independent/Modified Independent;Driving             Mobility Comments: Modified independent ambulating with SPC. Reports 2 recent falls entering/leaving home on the stairs ADLs Comments: indep with ADL and driving, doesn't work     Extremity/Trunk Assessment   Upper Extremity Assessment Upper  Extremity Assessment: Overall WFL for tasks assessed    Lower Extremity Assessment Lower Extremity Assessment: Generalized weakness (DF/PF groslsy 3+/5 with foot drag, grossly 4-/5 otherwise)       Communication   Communication Communication: No apparent difficulties    Cognition Arousal: Alert Behavior During Therapy: WFL for tasks assessed/performed   PT - Cognitive impairments: No apparent impairments                       PT - Cognition Comments: pleasant and agreeable Following commands: Intact       Cueing Cueing Techniques: Verbal cues     General Comments      Exercises     Assessment/Plan    PT Assessment Patient needs continued PT services  PT Problem List Decreased strength;Decreased balance;Decreased mobility;Decreased knowledge of use of DME;Decreased safety awareness       PT Treatment Interventions DME instruction;Gait training;Therapeutic exercise;Balance training    PT Goals (Current goals can be found in the Care Plan section)  Acute Rehab PT Goals Patient Stated Goal: to go home PT Goal Formulation: With patient Time For Goal Achievement: 08/09/23 Potential to Achieve Goals: Good    Frequency Min 2X/week     Co-evaluation               AM-PAC PT "6 Clicks" Mobility  Outcome Measure Help needed turning from your back to your side while in a flat bed without using bedrails?: None Help needed moving from lying on your back to sitting on the side of a flat bed without using bedrails?: None Help needed moving to and from a bed to a chair (including a wheelchair)?: A Little Help needed standing up from a chair using your arms (e.g., wheelchair or bedside chair)?: A Little Help needed to walk in hospital room?: A Little Help needed climbing 3-5 steps with a railing? : A Lot 6 Click Score: 19    End of Session Equipment Utilized During Treatment: Gait belt Activity Tolerance: Patient tolerated treatment well Patient left: in  chair;with chair alarm set Nurse Communication: Mobility status PT Visit Diagnosis: Muscle weakness (generalized) (M62.81);History of falling (Z91.81);Unsteadiness on feet (R26.81);Difficulty in walking, not elsewhere classified (R26.2)    Time: 4098-1191 PT Time Calculation (min) (ACUTE ONLY): 15 min   Charges:   PT Evaluation $PT Eval Low Complexity: 1 Low   PT General Charges $$ ACUTE PT VISIT: 1 Visit         Elizabeth Palau, PT, DPT, GCS [phone]   Serafina Topham 07/26/2023, 4:44 PM

## 2023-07-26 NOTE — Plan of Care (Signed)

## 2023-07-26 NOTE — Progress Notes (Signed)
 [facility]  ROUNDING NOTE   Subjective:   Tonya Myers is a 58 year old female with past medical history including depression, type 2 diabetes, asthma, hypertension, hypothyroidism, CLL, and end-stage renal disease on hemodialysis.  She presents to the emergency department with fever, abdominal pain, and diarrhea.  She has been admitted under observation for Herpes [B00.9] Acute diarrhea [R19.7] Thrombocytopenia (HCC) [D69.6] Abdominal pain, unspecified abdominal location [R10.9] Diarrhea, unspecified type [R19.7]   Patient is known our practice and receives outpatient dialysis treatments at [facility] on a MWF schedule, supervised by [facility] physicians.    Patient seen sitting up in bed Completely alert and oriented Has no recollection of most events that occurred yesterday including dialysis.  This makes patient tearful Patient states she does experience some confusion and loss of time outpatient. This is concerning as patient admits to living alone.   Objective:  Vital signs in last 24 hours:  Temp:  [97.8 F (36.6 C)-99.2 F (37.3 C)] 98 F (36.7 C) (04/01 0813) Pulse Rate:  [82-106] 84 (04/01 0813) Resp:  [11-20] 18 (04/01 0813) BP: (125-209)/(66-139) 139/75 (04/01 0813) SpO2:  [86 %-100 %] 95 % (04/01 0813) Weight:  [98 kg] 98 kg (03/31 1658)  Weight change: 4.559 kg Filed Weights   07/25/23 0110 07/25/23 1246 07/25/23 1658  Weight: 93.4 kg 98 kg 98 kg    Intake/Output: I/O last 3 completed shifts: In: 500 [IV Piggyback:500] Out: 0    Intake/Output this shift:  No intake/output data recorded.  Physical Exam: General: NAD  Head: Normocephalic, atraumatic. Moist oral mucosal membranes  Eyes: Anicteric  Lungs:  Clear to auscultation, normal effort  Heart: Regular rate and rhythm  Abdomen:  Soft, nontender,   Extremities:  No peripheral edema.  Neurologic: Alert and oriented moving all four extremities  Skin: No lesions   Access: Lt AVG    Basic Metabolic Panel: Recent Labs  Lab 07/25/23 0142 07/26/23 0422  NA 139 134*  K 5.5* 5.0  CL 99 95*  CO2 21* 27  GLUCOSE 133* 94  BUN 96* 45*  CREATININE 9.72* 6.43*  CALCIUM 9.8 8.6*    Liver Function Tests: Recent Labs  Lab 07/25/23 0142 07/26/23 0422  AST 24 23  ALT 21 16  ALKPHOS 58 58  BILITOT 0.8 0.9  PROT 7.4 7.1  ALBUMIN 3.8 3.5   Recent Labs  Lab 07/25/23 0142  LIPASE 96*   Recent Labs  Lab 07/25/23 0925  AMMONIA 28    CBC: Recent Labs  Lab 07/25/23 0142 07/26/23 0422  WBC 5.1 3.1*  HGB 11.7* 11.2*  HCT 38.3 35.3*  MCV 93.9 91.7  PLT 42* 35*    Cardiac Enzymes: No results for input(s): "CKTOTAL", "CKMB", "CKMBINDEX", "TROPONINI" in the last 168 hours.  BNP: Invalid input(s): "POCBNP"  CBG: Recent Labs  Lab 07/25/23 1144 07/25/23 2225 07/26/23 0814 07/26/23 1141  GLUCAP 95 117* 85 145*    Microbiology: Results for orders placed or performed during the hospital encounter of 07/25/23  SARS Coronavirus 2 by RT PCR (hospital order, performed in [facility] hospital lab) *cepheid single result test* Anterior Nasal Swab     Status: None   Collection Time: 07/25/23  1:30 AM   Specimen: Anterior Nasal Swab  Result Value Ref Range Status   SARS Coronavirus 2 by RT PCR NEGATIVE NEGATIVE Final    Comment: Performed at [facility], [address] [place], [place] [address]    Coagulation Studies: No results for input(s): "  LABPROT", "INR" in the last 72 hours.  Urinalysis: No results for input(s): "COLORURINE", "LABSPEC", "PHURINE", "GLUCOSEU", "HGBUR", "BILIRUBINUR", "KETONESUR", "PROTEINUR", "UROBILINOGEN", "NITRITE", "LEUKOCYTESUR" in the last 72 hours.  Invalid input(s): "APPERANCEUR"    Imaging: MR BRAIN WO CONTRAST Result Date: 07/25/2023 CLINICAL DATA:  58 year old female with syncope, neurologic deficit, diarrhea. EXAM: MRI HEAD WITHOUT CONTRAST TECHNIQUE: Multiplanar, multiecho pulse  sequences of the brain and surrounding structures were obtained without intravenous contrast. COMPARISON:  Brain MRI 03/29/2023 and earlier. FINDINGS: Brain: Overall cerebral volume remains normal for age. No restricted diffusion to suggest acute infarction. No midline shift, mass effect, evidence of mass lesion, ventriculomegaly, extra-axial collection or acute intracranial hemorrhage. Cervicomedullary junction and pituitary are within normal limits. No cortical encephalomalacia or chronic cerebral blood products identified. Patchy moderate for age and nonspecific cerebral white matter T2 and FLAIR hyperintensity is stable. Deep gray nuclei, brainstem and cerebellum remain within normal limits. Vascular: Major intracranial vascular flow voids are preserved, stable since December. Skull and upper cervical spine: Partially visible cervical spine degeneration. Background bone marrow signal remains within normal limits. Sinuses/Orbits: Stable, negative. Other: Grossly normal visible internal auditory structures. Negative visible scalp and face. IMPRESSION: 1. No acute intracranial abnormality. 2. Stable moderate for age nonspecific cerebral white matter signal changes, most commonly due to small vessel disease. Electronically Signed   By: Odessa Fleming M.D.   On: 07/25/2023 09:03   CT ABDOMEN PELVIS WO CONTRAST Result Date: 07/25/2023 CLINICAL DATA:  Acute, nonlocalized abdominal pain.  Diarrhea EXAM: CT ABDOMEN AND PELVIS WITHOUT CONTRAST TECHNIQUE: Multidetector CT imaging of the abdomen and pelvis was performed following the standard protocol without IV contrast. RADIATION DOSE REDUCTION: This exam was performed according to the departmental dose-optimization program which includes automated exposure control, adjustment of the mA and/or kV according to patient size and/or use of iterative reconstruction technique. COMPARISON:  12/12/2021 FINDINGS: Lower chest:  Coronary atherosclerosis. Hepatobiliary: Low-density  lesions in the subcapsular right lobe liver or subtle compared to 07/13/2021 comparison with no detected growth compatible with benign process.Physiologic distension of the gallbladder with layering gallstone. Pancreas: Scattered coarse calcifications attributed to old pancreatitis, no interval change. Spleen: Unremarkable. Adrenals/Urinary Tract: Negative adrenals. No hydronephrosis or stone. 15 mm cyst in the interpolar left kidney. Fairly symmetric renal atrophy with right kidney measuring up to 6 cm in length. Unremarkable bladder. Stomach/Bowel:  No obstruction. No appendicitis. Vascular/Lymphatic: No acute vascular abnormality. No mass or adenopathy. Reproductive:Hysterectomy. Other: No ascites or pneumoperitoneum. Musculoskeletal: No acute abnormalities. IMPRESSION: 1. No acute finding. 2. Cholelithiasis, chronic pancreatitis, and renal atrophy. Electronically Signed   By: Tiburcio Pea M.D.   On: 07/25/2023 05:01     Medications:      aspirin EC  81 mg Oral Daily   calcium acetate  1,334 mg Oral TID WC   Chlorhexidine Gluconate Cloth  6 each Topical Q0600   insulin aspart  0-9 Units Subcutaneous TID WC   irbesartan  300 mg Oral Daily   levothyroxine  125 mcg Oral Q0600   lipase/protease/amylase  12,000 Units Oral TID AC   multivitamin  1 tablet Oral QHS   propranolol  20 mg Oral TID   sodium chloride flush  3 mL Intravenous Q12H   valACYclovir  500 mg Oral Daily   acetaminophen, hydrOXYzine, sodium chloride flush  Assessment/ Plan:  Ms. Marcelene Weidemann is a 58 y.o.  female  with past medical history including depression, type 2 diabetes, asthma, hypertension, hypothyroidism, CLL, and end-stage renal disease on hemodialysis.  She presents to the emergency department with fever and diarrhea. She has been admitted under observation for Herpes [B00.9] Acute diarrhea [R19.7] Thrombocytopenia (HCC) [D69.6] Abdominal pain, unspecified abdominal location [R10.9] Diarrhea,  unspecified type [R19.7]   [facility] [place]/MWF/right AVF/91 kg   Hyperkalemia with end stage renal disease on hemodialysis.  Patient received dialysis yesterday, UF 0 due to diarrhea and nausea.  Potassium corrected to 5.0.  Next treatment scheduled for Wednesday.   2. 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  Lab Results  Component Value Date   HGB 11.2 (L) 07/26/2023    Thrombocytopenia noted outpatient also. Hgb remains at goal. Mircera prescribed at outpatient clinic.   3. Secondary Hyperparathyroidism: with outpatient labs: PTH 223, phosphorus 6.5, calcium 9.8 on 06/27/23.   Lab Results  Component Value Date   CALCIUM 8.6 (L) 07/26/2023   PHOS 7.4 (H) 03/30/2023   Currently prescribed calcitriol and calcium acetate outpatient.  Phosphorus elevated, this will correct with continued binders and dialysis.  4. Acute metabolic encephalopathy. Rambling, incoherent speech, random gross movements. MAR review concerning for pain, anxiety, and antiemetic medications received in close proximity to renal patient. Ammonia level and imaging negative.   Mentation has returned to baseline.  Patient encouraged to bring all medications to next dialysis for evaluation of current prescription and dosing.    LOS: 0 Dutch Ing 4/1/20252:08 PM

## 2023-07-26 NOTE — Progress Notes (Signed)
 Progress Note    Tonya Myers  ZHY:[id number] DOB: [birth year]  DOA: 07/25/2023 PCP: [name], MD      Brief Narrative:    Medical records reviewed and are as summarized below:  Tonya Myers is a 58 y.o. female with medical history significant of ESRD on HD MWF, COPD, cervical radiculopathy, lumbar spine radiculopathy, IIDM, anxiety/depression, hypothyroidism, CLL in remission, pancytopenia, who presented to the hospital because of general weakness, diarrhea, confusion and fever.  She also reported burning in the vaginal area which she attributed to herpes.  Patient said she has been having diarrhea on and off for about 6 months now.       Assessment/Plan:   Principal Problem:   Diarrhea Active Problems:   Acute metabolic encephalopathy   Herpes simplex vulvovaginitis   Type 2 diabetes mellitus with peripheral neuropathy (HCC)   End-stage renal disease on hemodialysis (HCC)   Hypothyroidism   Dyslipidemia   Asthma   Pancytopenia (HCC)   GERD without esophagitis    Body mass index is 29.3 kg/m.   Diarrhea: Patient says she has been having diarrhea on and off for about 6 months.  She said she was recently prescribed but she did not take it. Rule out acute infectious diarrhea.  Stool for GI panel and C. difficile toxin is pending. Suspect patient may have chronic diarrhea from chronic pancreatitis.   Chronic pancreatitis: Patient she was not aware she has chronic pancreatitis.  It is possible she has no recollection of this.  Will start Creon   Acute metabolic encephalopathy: Improved.  She reports intermittent confusion.  MRI brain did not show any acute abnormality Chart review shows she was hospitalized in December 2020 for for dysarthria and confusion and acute stroke was ruled out on that visit.   ESRD: He had hemodialysis yesterday.  Follow-up with nephrologist.   Pancytopenia, severe thrombocytopenia: Platelet dropped from  42-35.  Platelet was 20 on 07/18/2023.  No evidence bleeding at this time.   She follows with Dr. Orlie Dakin, oncologist.  He is considering bone marrow biopsy for further workup.   Herpes simplex vulvovaginitis: continue Valtrex   General Weakness: PT consult.  She lives alone at home   Comorbidities include CLL in remission, hypothyroidism, dyslipidemia, GERD, asthma   Diet Order             Diet renal/carb modified with fluid restriction Diet-HS Snack? Nothing; Fluid restriction: 1200 mL Fluid; Room service appropriate? Yes; Fluid consistency: Thin  Diet effective now                            Consultants: Nephrologist  Procedures: None    Medications:    aspirin EC  81 mg Oral Daily   calcium acetate  1,334 mg Oral TID WC   Chlorhexidine Gluconate Cloth  6 each Topical Q0600   insulin aspart  0-9 Units Subcutaneous TID WC   irbesartan  300 mg Oral Daily   levothyroxine  125 mcg Oral Q0600   multivitamin  1 tablet Oral QHS   propranolol  20 mg Oral TID   sodium chloride flush  3 mL Intravenous Q12H   valACYclovir  500 mg Oral Daily   Continuous Infusions:   Anti-infectives (From admission, onward)    Start     Dose/Rate Route Frequency Ordered Stop   07/26/23 1000  valACYclovir (VALTREX) tablet 500 mg        500  mg Oral Daily 07/25/23 0752 08/02/23 0959   07/25/23 0500  valACYclovir (VALTREX) tablet 1,000 mg        1,000 mg Oral  Once 07/25/23 0458 07/25/23 0522   07/25/23 0000  valACYclovir (VALTREX) 1000 MG tablet        1,000 mg Oral 3 times daily 07/25/23 0535                Family Communication/Anticipated D/C date and plan/Code Status   DVT prophylaxis: Place TED hose Start: 07/25/23 0748     Code Status: Full Code  Family Communication: None Disposition Plan: Plan to discharge home   Status is: Observation The patient will require care spanning > 2 midnights and should be moved to inpatient because: General Weakness,  intermittent confusion       Subjective:   Interval events noted.  She complains of general weakness.  She says she has been having diarrhea intermittently for about 6 months now.  She said she was prescribed Creon about 11 days ago but she never took it.  She says she was not aware she has chronic pancreatitis.  Objective:    Vitals:   07/25/23 2104 07/25/23 2228 07/26/23 0326 07/26/23 0813  BP: (!) 151/115  125/66 139/75  Pulse: (!) 104 (!) 102 89 84  Resp: 20  18 18   Temp: 99.2 F (37.3 C)  99.1 F (37.3 C) 98 F (36.7 C)  TempSrc: Oral  Oral   SpO2: (!) 86% 100% 100% 95%  Weight:      Height:       No data found.   Intake/Output Summary (Last 24 hours) at 07/26/2023 1033 Last data filed at 07/25/2023 1658 Gross per 24 hour  Intake --  Output 0 ml  Net 0 ml   Filed Weights   07/25/23 0110 07/25/23 1246 07/25/23 1658  Weight: 93.4 kg 98 kg 98 kg    Exam:  GEN: NAD SKIN: Warm and dry EYES: No pallor or icterus ENT: MMM CV: RRR PULM: CTA B ABD: soft, ND, NT, +BS CNS: AAO x 3, non focal EXT: No edema or tenderness     Data Reviewed:   I have personally reviewed following labs and imaging studies:  Labs: Labs show the following:   Basic Metabolic Panel: Recent Labs  Lab 07/25/23 0142 07/26/23 0422  NA 139 134*  K 5.5* 5.0  CL 99 95*  CO2 21* 27  GLUCOSE 133* 94  BUN 96* 45*  CREATININE 9.72* 6.43*  CALCIUM 9.8 8.6*   GFR Estimated Creatinine Clearance: 12.4 mL/min (A) (by C-G formula based on SCr of 6.43 mg/dL (H)). Liver Function Tests: Recent Labs  Lab 07/25/23 0142 07/26/23 0422  AST 24 23  ALT 21 16  ALKPHOS 58 58  BILITOT 0.8 0.9  PROT 7.4 7.1  ALBUMIN 3.8 3.5   Recent Labs  Lab 07/25/23 0142  LIPASE 96*   Recent Labs  Lab 07/25/23 0925  AMMONIA 28   Coagulation profile No results for input(s): "INR", "PROTIME" in the last 168 hours.  CBC: Recent Labs  Lab 07/25/23 0142 07/26/23 0422  WBC 5.1 3.1*  HGB  11.7* 11.2*  HCT 38.3 35.3*  MCV 93.9 91.7  PLT 42* 35*   Cardiac Enzymes: No results for input(s): "CKTOTAL", "CKMB", "CKMBINDEX", "TROPONINI" in the last 168 hours. BNP (last 3 results) No results for input(s): "PROBNP" in the last 8760 hours. CBG: Recent Labs  Lab 07/25/23 1144 07/25/23 2225 07/26/23 0814  GLUCAP 95  117* 85   D-Dimer: No results for input(s): "DDIMER" in the last 72 hours. Hgb A1c: No results for input(s): "HGBA1C" in the last 72 hours. Lipid Profile: No results for input(s): "CHOL", "HDL", "LDLCALC", "TRIG", "CHOLHDL", "LDLDIRECT" in the last 72 hours. Thyroid function studies: No results for input(s): "TSH", "T4TOTAL", "T3FREE", "THYROIDAB" in the last 72 hours.  Invalid input(s): "FREET3" Anemia work up: No results for input(s): "VITAMINB12", "FOLATE", "FERRITIN", "TIBC", "IRON", "RETICCTPCT" in the last 72 hours. Sepsis Labs: Recent Labs  Lab 07/25/23 0142 07/26/23 0422  WBC 5.1 3.1*    Microbiology Recent Results (from the past 240 hours)  SARS Coronavirus 2 by RT PCR (hospital order, performed in [facility] hospital lab) *cepheid single result test* Anterior Nasal Swab     Status: None   Collection Time: 07/25/23  1:30 AM   Specimen: Anterior Nasal Swab  Result Value Ref Range Status   SARS Coronavirus 2 by RT PCR NEGATIVE NEGATIVE Final    Comment: Performed at [facility], [address], [place], [place] [address]    Procedures and diagnostic studies:  MR BRAIN WO CONTRAST Result Date: 07/25/2023 CLINICAL DATA:  58 year old female with syncope, neurologic deficit, diarrhea. EXAM: MRI HEAD WITHOUT CONTRAST TECHNIQUE: Multiplanar, multiecho pulse sequences of the brain and surrounding structures were obtained without intravenous contrast. COMPARISON:  Brain MRI 03/29/2023 and earlier. FINDINGS: Brain: Overall cerebral volume remains normal for age. No restricted diffusion to suggest acute infarction. No midline shift, mass  effect, evidence of mass lesion, ventriculomegaly, extra-axial collection or acute intracranial hemorrhage. Cervicomedullary junction and pituitary are within normal limits. No cortical encephalomalacia or chronic cerebral blood products identified. Patchy moderate for age and nonspecific cerebral white matter T2 and FLAIR hyperintensity is stable. Deep gray nuclei, brainstem and cerebellum remain within normal limits. Vascular: Major intracranial vascular flow voids are preserved, stable since December. Skull and upper cervical spine: Partially visible cervical spine degeneration. Background bone marrow signal remains within normal limits. Sinuses/Orbits: Stable, negative. Other: Grossly normal visible internal auditory structures. Negative visible scalp and face. IMPRESSION: 1. No acute intracranial abnormality. 2. Stable moderate for age nonspecific cerebral white matter signal changes, most commonly due to small vessel disease. Electronically Signed   By: Odessa Fleming M.D.   On: 07/25/2023 09:03   CT ABDOMEN PELVIS WO CONTRAST Result Date: 07/25/2023 CLINICAL DATA:  Acute, nonlocalized abdominal pain.  Diarrhea EXAM: CT ABDOMEN AND PELVIS WITHOUT CONTRAST TECHNIQUE: Multidetector CT imaging of the abdomen and pelvis was performed following the standard protocol without IV contrast. RADIATION DOSE REDUCTION: This exam was performed according to the departmental dose-optimization program which includes automated exposure control, adjustment of the mA and/or kV according to patient size and/or use of iterative reconstruction technique. COMPARISON:  12/12/2021 FINDINGS: Lower chest:  Coronary atherosclerosis. Hepatobiliary: Low-density lesions in the subcapsular right lobe liver or subtle compared to 07/13/2021 comparison with no detected growth compatible with benign process.Physiologic distension of the gallbladder with layering gallstone. Pancreas: Scattered coarse calcifications attributed to old pancreatitis, no  interval change. Spleen: Unremarkable. Adrenals/Urinary Tract: Negative adrenals. No hydronephrosis or stone. 15 mm cyst in the interpolar left kidney. Fairly symmetric renal atrophy with right kidney measuring up to 6 cm in length. Unremarkable bladder. Stomach/Bowel:  No obstruction. No appendicitis. Vascular/Lymphatic: No acute vascular abnormality. No mass or adenopathy. Reproductive:Hysterectomy. Other: No ascites or pneumoperitoneum. Musculoskeletal: No acute abnormalities. IMPRESSION: 1. No acute finding. 2. Cholelithiasis, chronic pancreatitis, and renal atrophy. Electronically Signed   By: Tiburcio Pea  M.D.   On: 07/25/2023 05:01               LOS: 0 days   Jaydn Moscato  [facility]   Pager on [email or web address]. If 7PM-7AM, please contact night-coverage at [email or web address]     07/26/2023, 10:33 AM

## 2023-07-27 ENCOUNTER — Inpatient Hospital Stay

## 2023-07-27 ENCOUNTER — Encounter: Payer: Self-pay | Admitting: Internal Medicine

## 2023-07-27 ENCOUNTER — Inpatient Hospital Stay: Admitting: Oncology

## 2023-07-27 DIAGNOSIS — E1142 Type 2 diabetes mellitus with diabetic polyneuropathy: Secondary | ICD-10-CM

## 2023-07-27 DIAGNOSIS — G9341 Metabolic encephalopathy: Secondary | ICD-10-CM | POA: Diagnosis not present

## 2023-07-27 DIAGNOSIS — R197 Diarrhea, unspecified: Secondary | ICD-10-CM | POA: Diagnosis not present

## 2023-07-27 DIAGNOSIS — N186 End stage renal disease: Secondary | ICD-10-CM | POA: Diagnosis not present

## 2023-07-27 DIAGNOSIS — A6004 Herpesviral vulvovaginitis: Secondary | ICD-10-CM | POA: Diagnosis not present

## 2023-07-27 LAB — RENAL FUNCTION PANEL
Albumin: 3.2 g/dL — ABNORMAL LOW (ref 3.5–5.0)
Anion gap: 17 — ABNORMAL HIGH (ref 5–15)
BUN: 70 mg/dL — ABNORMAL HIGH (ref 6–20)
CO2: 23 mmol/L (ref 22–32)
Calcium: 8.3 mg/dL — ABNORMAL LOW (ref 8.9–10.3)
Chloride: 94 mmol/L — ABNORMAL LOW (ref 98–111)
Creatinine, Ser: 9.49 mg/dL — ABNORMAL HIGH (ref 0.44–1.00)
GFR, Estimated: 4 mL/min — ABNORMAL LOW (ref >60)
Glucose, Bld: 89 mg/dL (ref 70–99)
Phosphorus: 8.1 mg/dL — ABNORMAL HIGH (ref 2.5–4.6)
Potassium: 4.7 mmol/L (ref 3.5–5.1)
Sodium: 134 mmol/L — ABNORMAL LOW (ref 135–145)

## 2023-07-27 LAB — CBC
HCT: 36.6 % (ref 36.0–46.0)
Hemoglobin: 11.4 g/dL — ABNORMAL LOW (ref 12.0–15.0)
MCH: 29.2 pg (ref 26.0–34.0)
MCHC: 31.1 g/dL (ref 30.0–36.0)
MCV: 93.6 fL (ref 80.0–100.0)
Platelets: 33 10*3/uL — ABNORMAL LOW (ref 150–400)
RBC: 3.91 MIL/uL (ref 3.87–5.11)
RDW: 20.7 % — ABNORMAL HIGH (ref 11.5–15.5)
WBC: 2.8 10*3/uL — ABNORMAL LOW (ref 4.0–10.5)
nRBC: 0.7 % — ABNORMAL HIGH (ref 0.0–0.2)

## 2023-07-27 LAB — GLUCOSE, CAPILLARY: Glucose-Capillary: 158 mg/dL — ABNORMAL HIGH (ref 70–99)

## 2023-07-27 MED ORDER — PENTAFLUOROPROP-TETRAFLUOROETH EX AERO: 1.0000 | INHALATION_SPRAY | CUTANEOUS | Status: DC | PRN

## 2023-07-27 MED ORDER — LIDOCAINE-PRILOCAINE 2.5-2.5 % EX CREA: 1.0000 | TOPICAL_CREAM | CUTANEOUS | Status: DC | PRN

## 2023-07-27 MED ORDER — HYDROXYZINE HCL 25 MG PO TABS: 25.0000 mg | ORAL_TABLET | Freq: Three times a day (TID) | ORAL | 0 refills | Status: DC | PRN

## 2023-07-27 MED ORDER — PANTOPRAZOLE SODIUM 40 MG PO TBEC: 40.0000 mg | DELAYED_RELEASE_TABLET | Freq: Every day | ORAL | 1 refills | Status: DC

## 2023-07-27 MED ORDER — ONDANSETRON 4 MG PO TBDP: 4.0000 mg | ORAL_TABLET | Freq: Three times a day (TID) | ORAL | 0 refills | Status: DC | PRN

## 2023-07-27 MED ORDER — ONDANSETRON HCL 4 MG/2ML IJ SOLN
4.0000 mg | Freq: Three times a day (TID) | INTRAMUSCULAR | Status: DC | PRN
Start: 2023-07-27 — End: 2023-07-27
  Administered 2023-07-27: 4 mg via INTRAVENOUS
  Filled 2023-07-27: qty 2

## 2023-07-27 MED ORDER — HEPARIN SODIUM (PORCINE) 1000 UNIT/ML DIALYSIS: 1000.0000 [IU] | INTRAMUSCULAR | Status: DC | PRN

## 2023-07-27 NOTE — Progress Notes (Signed)
 [facility]  ROUNDING NOTE   Subjective:   Tonya Myers is a 58 year old female with past medical history including depression, type 2 diabetes, asthma, hypertension, hypothyroidism, CLL, and end-stage renal disease on hemodialysis.  She presents to the emergency department with fever, abdominal pain, and diarrhea.  She has been admitted under observation for Herpes [B00.9] Acute diarrhea [R19.7] Thrombocytopenia (HCC) [D69.6] Abdominal pain, unspecified abdominal location [R10.9] Diarrhea, unspecified type [R19.7]   Patient is known our practice and receives outpatient dialysis treatments at [facility] on a MWF schedule, supervised by [facility] physicians.    Patient seen and evaluated during dialysis   HEMODIALYSIS FLOWSHEET:  Blood Flow Rate (mL/min): 319 mL/min Arterial Pressure (mmHg): -162.82 mmHg Venous Pressure (mmHg): 243.63 mmHg TMP (mmHg): 13.13 mmHg Ultrafiltration Rate (mL/min): 828 mL/min Dialysate Flow Rate (mL/min): 299 ml/min  Mentation has returned to baseline Tolerating treatment well   Objective:  Vital signs in last 24 hours:  Temp:  [98 F (36.7 C)-99.5 F (37.5 C)] 98.5 F (36.9 C) (04/02 1200) Pulse Rate:  [25-80] 25 (04/02 1200) Resp:  [11-18] 14 (04/02 1200) BP: (107-137)/(68-107) 107/71 (04/02 1200) SpO2:  [67 %-100 %] 67 % (04/02 1200) Weight:  [87.8 kg-89.8 kg] 87.8 kg (04/02 1200)  Weight change:  Filed Weights   07/25/23 1658 07/27/23 0806 07/27/23 1200  Weight: 98 kg 89.8 kg 87.8 kg    Intake/Output: No intake/output data recorded.   Intake/Output this shift:  Total I/O In: -  Out: 2 [Other:2]  Physical Exam: General: NAD  Head: Normocephalic, atraumatic. Moist oral mucosal membranes  Eyes: Anicteric  Lungs:  Clear to auscultation, normal effort  Heart: Regular rate and rhythm  Abdomen:  Soft, nontender,   Extremities:  No peripheral edema.  Neurologic: Alert and oriented moving all four  extremities  Skin: No lesions  Access: Lt AVG    Basic Metabolic Panel: Recent Labs  Lab 07/25/23 0142 07/26/23 0422 07/27/23 0811  NA 139 134* 134*  K 5.5* 5.0 4.7  CL 99 95* 94*  CO2 21* 27 23  GLUCOSE 133* 94 89  BUN 96* 45* 70*  CREATININE 9.72* 6.43* 9.49*  CALCIUM 9.8 8.6* 8.3*  PHOS  --   --  8.1*    Liver Function Tests: Recent Labs  Lab 07/25/23 0142 07/26/23 0422 07/27/23 0811  AST 24 23  --   ALT 21 16  --   ALKPHOS 58 58  --   BILITOT 0.8 0.9  --   PROT 7.4 7.1  --   ALBUMIN 3.8 3.5 3.2*   Recent Labs  Lab 07/25/23 0142  LIPASE 96*   Recent Labs  Lab 07/25/23 0925  AMMONIA 28    CBC: Recent Labs  Lab 07/25/23 0142 07/26/23 0422 07/27/23 0511  WBC 5.1 3.1* 2.8*  HGB 11.7* 11.2* 11.4*  HCT 38.3 35.3* 36.6  MCV 93.9 91.7 93.6  PLT 42* 35* 33*    Cardiac Enzymes: No results for input(s): "CKTOTAL", "CKMB", "CKMBINDEX", "TROPONINI" in the last 168 hours.  BNP: Invalid input(s): "POCBNP"  CBG: Recent Labs  Lab 07/26/23 0814 07/26/23 1141 07/26/23 1846 07/26/23 2052 07/27/23 1410  GLUCAP 85 145* 122* 131* 158*    Microbiology: Results for orders placed or performed during the hospital encounter of 07/25/23  SARS Coronavirus 2 by RT PCR (hospital order, performed in [facility] hospital lab) *cepheid single result test* Anterior Nasal Swab     Status: None   Collection Time: 07/25/23  1:30 AM  Specimen: Anterior Nasal Swab  Result Value Ref Range Status   SARS Coronavirus 2 by RT PCR NEGATIVE NEGATIVE Final    Comment: Performed at [facility], [address] [place], [place] [address]    Coagulation Studies: No results for input(s): "LABPROT", "INR" in the last 72 hours.  Urinalysis: Recent Labs    07/26/23 2200  COLORURINE YELLOW*  LABSPEC 1.008  PHURINE 9.0*  GLUCOSEU NEGATIVE  HGBUR SMALL*  BILIRUBINUR NEGATIVE  KETONESUR NEGATIVE  PROTEINUR 100*  NITRITE NEGATIVE  LEUKOCYTESUR LARGE*       Imaging: No results found.    Medications:      aspirin EC  81 mg Oral Daily   calcium acetate  1,334 mg Oral TID WC   Chlorhexidine Gluconate Cloth  6 each Topical Q0600   insulin aspart  0-9 Units Subcutaneous TID WC   irbesartan  300 mg Oral Daily   levothyroxine  125 mcg Oral Q0600   lipase/protease/amylase  12,000 Units Oral TID AC   multivitamin  1 tablet Oral QHS   propranolol  20 mg Oral TID   sodium chloride flush  3 mL Intravenous Q12H   valACYclovir  500 mg Oral Daily   acetaminophen, hydrOXYzine, ondansetron (ZOFRAN) IV, sodium chloride flush  Assessment/ Plan:  [name] is a 58 y.o.  female  with past medical history including depression, type 2 diabetes, asthma, hypertension, hypothyroidism, CLL, and end-stage renal disease on hemodialysis.  She presents to the emergency department with fever and diarrhea. She has been admitted under observation for Herpes [B00.9] Acute diarrhea [R19.7] Thrombocytopenia (HCC) [D69.6] Abdominal pain, unspecified abdominal location [R10.9] Diarrhea, unspecified type [R19.7]   [facility]/MWF/right AVF/91 kg   Hyperkalemia with end stage renal disease on hemodialysis.  Patient received dialysis today, UF 1.5 to 2 L as tolerated.  Next treatment scheduled for Friday.  Will defer discharge plan to primary team.  Patient cleared from renal stents.  2. 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  Lab Results  Component Value Date   HGB 11.4 (L) 07/27/2023    Thrombocytopenia noted outpatient also. Hgb at goal. Mircera prescribed at outpatient clinic.   3. Secondary Hyperparathyroidism: with outpatient labs: PTH 223, phosphorus 6.5, calcium 9.8 on 06/27/23.   Lab Results  Component Value Date   CALCIUM 8.3 (L) 07/27/2023   PHOS 8.1 (H) 07/27/2023   Currently prescribed calcitriol and calcium acetate outpatient.  Hyperphosphatemia noted.  Continue calcium acetate with meals.  4. Acute  metabolic encephalopathy. Rambling, incoherent speech, random gross movements. MAR review concerning for pain, anxiety, and antiemetic medications received in close proximity to renal patient. Ammonia level and imaging negative.   Mentation remains at baseline.    LOS: 0 Tonya Myers 4/2/20252:25 PM

## 2023-07-27 NOTE — Plan of Care (Signed)

## 2023-07-27 NOTE — Progress Notes (Signed)
 Hemodialysis Note:  Received patient in bed to unit. Alert and oriented. Informed consent singed and in chart.  Treatment initiated: 0818 Treatment completed: 1200  Access used: Right AVF Access issues: None  Patient tolerated well. Transported back to room, alert without acute distress. Report given to patient's RN.  Total UF removed: 2 Liters Medications given: None  Post HD weight: 87.8 Kg  [facility]

## 2023-07-27 NOTE — TOC Initial Note (Signed)
 Transition of Care [facility]) - Initial/Assessment Note    Patient Details  Name: Tonya Myers MRN: [id number] Date of Birth: 06/14/1965  Transition of Care [facility]) [name] Contact:    Chapman Fitch, RN Phone Number: 07/27/2023, 3:32 PM  Clinical Narrative:                      Patient to discharge today No guardianship paperwork on file Patient states that her friend will transport at discharge Patient agreeable to home health agency.  Patient states she does not have a preference of agency.  Referral made to [facility] with Cresenciano Genre  RW referral made to Jon with [name] to be delivered to room prior to discharge   Patient Goals and CMS Choice            Expected Discharge Plan and Services         Expected Discharge Date: 07/27/23                                    Prior Living Arrangements/Services                       Activities of Daily Living   ADL Screening (condition at time of admission) Independently performs ADLs?: No Does the patient have a NEW difficulty with bathing/dressing/toileting/self-feeding that is expected to last >3 days?: Yes (Initiates electronic notice to provider for possible OT consult) Does the patient have a NEW difficulty with getting in/out of bed, walking, or climbing stairs that is expected to last >3 days?: Yes (Initiates electronic notice to provider for possible PT consult) Does the patient have a NEW difficulty with communication that is expected to last >3 days?: No Is the patient deaf or have difficulty hearing?: No Does the patient have difficulty seeing, even when wearing glasses/contacts?: No Does the patient have difficulty concentrating, remembering, or making decisions?: No  Permission Sought/Granted                  Emotional Assessment              Admission diagnosis:  Herpes [B00.9] Acute diarrhea [R19.7] Thrombocytopenia (HCC) [D69.6] Abdominal pain, unspecified abdominal location  [R10.9] Diarrhea, unspecified type [R19.7] Patient Active Problem List   Diagnosis Date Noted   Diarrhea 07/25/2023   Dysarthria 03/28/2023   Uremia 03/28/2023   Stroke (HCC) 03/28/2023   Sacral decubitus ulcer, stage IV (HCC) 12/23/2021   Protein-calorie malnutrition, moderate (HCC) 12/17/2021   History of herpes genitalis 12/14/2021   Sepsis (HCC) 12/12/2021   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   Closed fracture of sternum 12/12/2021   Chronic pancreatitis (HCC) 12/12/2021   Asthma 12/12/2021   Anemia in ESRD (end-stage renal disease) (HCC) 12/12/2021   Type II diabetes mellitus with renal manifestations (HCC) 12/12/2021   UTI (urinary tract infection) 12/12/2021   Pressure injury of skin 12/11/2021   Elevated troponin 12/10/2021   Syncope 12/09/2021   Dyslipidemia 12/09/2021   Type 2 diabetes mellitus with peripheral neuropathy (HCC) 12/09/2021   GERD without esophagitis 12/09/2021   End-stage renal disease on hemodialysis (HCC) 12/09/2021   Thrombocytopenia (HCC) 12/11/2020   FUO (fever of unknown origin) 12/11/2020   Pancytopenia (HCC) 12/11/2020   Oral thrush 12/11/2020   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HCC) 12/10/2020   Right arm weakness 10/21/2020   Quadriplegia (HCC) 10/10/2020   Wheelchair dependence 10/10/2020   Transient neurologic deficit  12/15/21 10/02/2020   Functional paraparesis 10/02/2020   Left upper quadrant abdominal pain    Pain    Chronic pain of both knees    Hypokalemia    Urinary retention    Slow transit constipation    Meningitis    Labile blood glucose    Uncontrolled type 2 diabetes mellitus with hyperosmolar nonketotic hyperglycemia (HCC)    Transaminitis    Sleep disturbance    Anemia of chronic disease    Bacterial spinal epidural abscess 06/27/2020   Obesity (BMI 30.0-34.9)    Hypomagnesemia    Hypernatremia    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HCC)    Epidural abscess    Pneumococcal meningitis    Hyperglycemic crisis in diabetes mellitus (HCC) 06/10/2020    Acute metabolic encephalopathy 06/10/2020   Severe sepsis (HCC)    Diabetic ketoacidosis without coma associated with type 2 diabetes mellitus (HCC)    AKI (acute kidney injury) (HCC)    Elevated LFTs    Acute encephalopathy 06/09/2020   Bacterial vaginosis 11/01/2016   Chronic right shoulder pain 03/23/2016   Numbness and tingling 03/23/2016   Uncontrolled type 2 diabetes mellitus with hyperglycemia, without long-term current use of insulin (HCC) 10/27/2015   DDD (degenerative disc disease), cervical 07/29/2015   Cervical disc disorder with radiculopathy of cervical region 07/29/2015   Cervical facet syndrome 07/29/2015   DDD (degenerative disc disease), lumbar 07/29/2015   Facet syndrome, lumbar 07/29/2015   Bilateral occipital neuralgia 07/29/2015   Sacroiliac joint dysfunction 07/29/2015   DJD of shoulder 07/29/2015   Herpes simplex vulvovaginitis 05/29/2015   Acute hemorrhoid 03/11/2015   Personal history of CLL (chronic lymphocytic leukemia) 01/15/2015   Hypothyroidism 12/26/2014   Arthritis 12/26/2014   Carpal tunnel syndrome, bilateral 12/26/2014   Controlled type 2 diabetes mellitus without complication, without long-term current use of insulin (HCC) 12/26/2014   Essential hypertension 12/26/2014   Hyperlipidemia, mixed 12/26/2014   PCP:  [name], MD Pharmacy:   [facility] [address] [place] - [address] [place] [address] Phone: [phone] Fax: [phone]     Social Drivers of Health (SDOH) Social History: SDOH Screenings   Food Insecurity: No Food Insecurity (07/26/2023)  Recent Concern: Food Insecurity - Food Insecurity Present (07/06/2023)   Received from [facility]  Housing: Low Risk  (07/26/2023)  Recent Concern: Housing - High Risk (07/06/2023)   Received from [facility]  Transportation Needs: No Transportation Needs (07/26/2023)  Recent Concern: Transportation Needs - Unmet  Transportation Needs (07/06/2023)   Received from [facility]  Utilities: Not At Risk (07/26/2023)  Depression (PHQ2-9): Low Risk  (10/29/2020)  Financial Resource Strain: High Risk (07/06/2023)   Received from [facility]  Tobacco Use: Low Risk  (07/25/2023)   SDOH Interventions:     Readmission Risk Interventions    12/24/2021   12:03 PM 11/24/2020    2:17 PM  Readmission Risk Prevention Plan  Transportation Screening Complete Complete  PCP or Specialist Appt within 3-5 Days  Complete  HRI or Home Care Consult  Complete  Social Work Consult for Recovery Care Planning/Counseling  Complete  Palliative Care Screening  Not Applicable  Medication Review Oceanographer) Complete Complete  PCP or Specialist appointment within 3-5 days of discharge Complete   HRI or Home Care Consult Complete   SW Recovery Care/Counseling Consult Complete   Palliative Care Screening Not Applicable   Skilled Nursing Facility Complete

## 2023-07-27 NOTE — Progress Notes (Signed)
 OT Cancellation Note  Patient Details Name: Tonya Myers MRN: [id number] DOB: 05-13-1965   Cancelled Treatment:    Reason Eval/Treat Not Completed: Patient at procedure or test/ unavailable. Pt is currently off the floor for HD, will re-attempt at later date/time.  Constance Goltz 07/27/2023, 8:36 AM

## 2023-07-27 NOTE — Discharge Summary (Signed)
 Physician Discharge Summary   Patient: Tonya Myers MRN: [id number] DOB: February 16, 1966  Admit date:     07/25/2023  Discharge date: {dischdate:26783}  Discharge Physician: Marcelino Duster   PCP: [name], MD   Recommendations at discharge:  {Tip this will not be part of the note when signed- Example include specific recommendations for outpatient follow-up, pending tests to follow-up on. (Optional):26781}  ***  Discharge Diagnoses: Principal Problem:   Diarrhea Active Problems:   Acute metabolic encephalopathy   Herpes simplex vulvovaginitis   Type 2 diabetes mellitus with peripheral neuropathy (HCC)   End-stage renal disease on hemodialysis (HCC)   Hypothyroidism   Dyslipidemia   Asthma   Pancytopenia (HCC)   GERD without esophagitis  Resolved Problems:   * No resolved hospital problems. *  Hospital Course: No notes on file  Assessment and Plan: * Diarrhea Acute diarrhea times roughly 7 days Minimal abdominal pain No reported bloody or black stools ? Recurring issue  GI panel pending Monitor Consider outpt GI eval if symtpoms persist.   Acute metabolic encephalopathy Positive generalized lethargy on presentation in setting of ESRD on hemodialysis Mild confusion and?  Word finding issues-unclear baseline Grossly nonfocal neuroexam BUN 96 Will hold sedating medications Check ammonia level MRI of the brain x 1 Monitor  End-stage renal disease on hemodialysis (HCC) Baseline ESRD on hemodialysis Monday Wednesday Friday Due for dialysis today Consult nephrology  Type 2 diabetes mellitus with peripheral neuropathy (HCC) Glucose 130s  SSI  Monitor    Herpes simplex vulvovaginitis Pt reports acute flare  Will start on valtrex  Monitor    Hypothyroidism Continue synthroid    Dyslipidemia Cont statin    Asthma Stable from a resp standpoint  Cont home inhalers    GERD without esophagitis PPI    Pancytopenia (HCC) WBC 5.1,  hgb 11.7, plt 40( above baseline)  Continue vitamin supplementation Hold AC Monitor        {Tip this will not be part of the note when signed Body mass index is 26.25 kg/m. , ,  Active Pressure Injury/Wound(s)     Pressure Ulcer  Duration          Pressure Injury 12/12/21 Coccyx Stage 2 -  Partial thickness loss of dermis presenting as a shallow open injury with a red, pink wound bed without slough. 592 days           (Optional):26781}  {(NOTE) Pain control PDMP Statment (Optional):26782} Consultants: *** Procedures performed: ***  Disposition: {Plan; Disposition:26390} Diet recommendation:  Discharge Diet Orders (From admission, onward)     Start     Ordered   07/27/23 0000  Diet - low sodium heart healthy        07/27/23 1418   07/27/23 0000  Diet Carb Modified        07/27/23 1418           {Diet_Plan:26776} DISCHARGE MEDICATION: Allergies as of 07/27/2023       Reactions   Amoxapine    Other Reaction(s): Unknown   Latex    Ceftazidime    Other reaction(s): Confusion, Hallucination, Unknown Encephalopathy - improved after dialysis/some concern for cephalosporin neurotoxicity Tolerated without confusion 06/2021. Encephalopathy - improved after dialysis/some concern for cephalosporin neurotoxicity Tolerated without confusion 06/2021.   Iodinated Contrast Media Hives   Other Reaction(s): Hives   Penicillins    Other Reaction(s): Unknown     Med Rec must be completed prior to using this [facility]***       Follow-up  Information     [name], MD. Schedule an appointment as soon as possible for a visit in 3 days.   Specialty: Family Medicine Contact information: [address] [place] [address] [phone]                Discharge Exam: Ceasar Mons Weights   07/25/23 1658 07/27/23 0806 07/27/23 1200  Weight: 98 kg 89.8 kg 87.8 kg   ***  Condition at discharge: {DC Condition:26389}  The results of  significant diagnostics from this hospitalization (including imaging, microbiology, ancillary and laboratory) are listed below for reference.   Imaging Studies: MR BRAIN WO CONTRAST Result Date: 07/25/2023 CLINICAL DATA:  58 year old female with syncope, neurologic deficit, diarrhea. EXAM: MRI HEAD WITHOUT CONTRAST TECHNIQUE: Multiplanar, multiecho pulse sequences of the brain and surrounding structures were obtained without intravenous contrast. COMPARISON:  Brain MRI 03/29/2023 and earlier. FINDINGS: Brain: Overall cerebral volume remains normal for age. No restricted diffusion to suggest acute infarction. No midline shift, mass effect, evidence of mass lesion, ventriculomegaly, extra-axial collection or acute intracranial hemorrhage. Cervicomedullary junction and pituitary are within normal limits. No cortical encephalomalacia or chronic cerebral blood products identified. Patchy moderate for age and nonspecific cerebral white matter T2 and FLAIR hyperintensity is stable. Deep gray nuclei, brainstem and cerebellum remain within normal limits. Vascular: Major intracranial vascular flow voids are preserved, stable since December. Skull and upper cervical spine: Partially visible cervical spine degeneration. Background bone marrow signal remains within normal limits. Sinuses/Orbits: Stable, negative. Other: Grossly normal visible internal auditory structures. Negative visible scalp and face. IMPRESSION: 1. No acute intracranial abnormality. 2. Stable moderate for age nonspecific cerebral white matter signal changes, most commonly due to small vessel disease. Electronically Signed   By: Odessa Fleming M.D.   On: 07/25/2023 09:03   CT ABDOMEN PELVIS WO CONTRAST Result Date: 07/25/2023 CLINICAL DATA:  Acute, nonlocalized abdominal pain.  Diarrhea EXAM: CT ABDOMEN AND PELVIS WITHOUT CONTRAST TECHNIQUE: Multidetector CT imaging of the abdomen and pelvis was performed following the standard protocol without IV contrast.  RADIATION DOSE REDUCTION: This exam was performed according to the departmental dose-optimization program which includes automated exposure control, adjustment of the mA and/or kV according to patient size and/or use of iterative reconstruction technique. COMPARISON:  12/12/2021 FINDINGS: Lower chest:  Coronary atherosclerosis. Hepatobiliary: Low-density lesions in the subcapsular right lobe liver or subtle compared to 07/13/2021 comparison with no detected growth compatible with benign process.Physiologic distension of the gallbladder with layering gallstone. Pancreas: Scattered coarse calcifications attributed to old pancreatitis, no interval change. Spleen: Unremarkable. Adrenals/Urinary Tract: Negative adrenals. No hydronephrosis or stone. 15 mm cyst in the interpolar left kidney. Fairly symmetric renal atrophy with right kidney measuring up to 6 cm in length. Unremarkable bladder. Stomach/Bowel:  No obstruction. No appendicitis. Vascular/Lymphatic: No acute vascular abnormality. No mass or adenopathy. Reproductive:Hysterectomy. Other: No ascites or pneumoperitoneum. Musculoskeletal: No acute abnormalities. IMPRESSION: 1. No acute finding. 2. Cholelithiasis, chronic pancreatitis, and renal atrophy. Electronically Signed   By: Tiburcio Pea M.D.   On: 07/25/2023 05:01    Microbiology: Results for orders placed or performed during the hospital encounter of 07/25/23  SARS Coronavirus 2 by RT PCR (hospital order, performed in [facility] hospital lab) *cepheid single result test* Anterior Nasal Swab     Status: None   Collection Time: 07/25/23  1:30 AM   Specimen: Anterior Nasal Swab  Result Value Ref Range Status   SARS Coronavirus 2 by RT PCR NEGATIVE NEGATIVE Final  Comment: Performed at [facility], [address] [place], [place] [address]    Labs: CBC: Recent Labs  Lab 07/25/23 0142 07/26/23 0422 07/27/23 0511  WBC 5.1 3.1* 2.8*  HGB 11.7* 11.2* 11.4*  HCT 38.3 35.3* 36.6   MCV 93.9 91.7 93.6  PLT 42* 35* 33*   Basic Metabolic Panel: Recent Labs  Lab 07/25/23 0142 07/26/23 0422 07/27/23 0811  NA 139 134* 134*  K 5.5* 5.0 4.7  CL 99 95* 94*  CO2 21* 27 23  GLUCOSE 133* 94 89  BUN 96* 45* 70*  CREATININE 9.72* 6.43* 9.49*  CALCIUM 9.8 8.6* 8.3*  PHOS  --   --  8.1*   Liver Function Tests: Recent Labs  Lab 07/25/23 0142 07/26/23 0422 07/27/23 0811  AST 24 23  --   ALT 21 16  --   ALKPHOS 58 58  --   BILITOT 0.8 0.9  --   PROT 7.4 7.1  --   ALBUMIN 3.8 3.5 3.2*   CBG: Recent Labs  Lab 07/26/23 0814 07/26/23 1141 07/26/23 1846 07/26/23 2052 07/27/23 1410  GLUCAP 85 145* 122* 131* 158*    Discharge time spent: {LESS THAN/GREATER THAN:26388} 30 minutes.  Signed: Marcelino Duster, MD [facility] 07/27/2023

## 2023-07-29 ENCOUNTER — Emergency Department

## 2023-07-29 ENCOUNTER — Observation Stay
Admission: EM | Admit: 2023-07-29 | Discharge: 2023-08-03 | Disposition: A | Discharge status: Home / Self Care | Attending: Obstetrics and Gynecology | Admitting: Obstetrics and Gynecology

## 2023-07-29 ENCOUNTER — Other Ambulatory Visit: Payer: Self-pay

## 2023-07-29 DIAGNOSIS — K219 Gastro-esophageal reflux disease without esophagitis: Secondary | ICD-10-CM | POA: Diagnosis present

## 2023-07-29 DIAGNOSIS — R55 Syncope and collapse: Principal | ICD-10-CM

## 2023-07-29 DIAGNOSIS — N186 End stage renal disease: Secondary | ICD-10-CM | POA: Diagnosis not present

## 2023-07-29 DIAGNOSIS — E039 Hypothyroidism, unspecified: Secondary | ICD-10-CM | POA: Insufficient documentation

## 2023-07-29 DIAGNOSIS — I120 Hypertensive chronic kidney disease with stage 5 chronic kidney disease or end stage renal disease: Secondary | ICD-10-CM | POA: Insufficient documentation

## 2023-07-29 DIAGNOSIS — R2681 Unsteadiness on feet: Secondary | ICD-10-CM | POA: Diagnosis not present

## 2023-07-29 DIAGNOSIS — I10 Essential (primary) hypertension: Secondary | ICD-10-CM | POA: Diagnosis present

## 2023-07-29 DIAGNOSIS — Z992 Dependence on renal dialysis: Secondary | ICD-10-CM | POA: Insufficient documentation

## 2023-07-29 DIAGNOSIS — E785 Hyperlipidemia, unspecified: Secondary | ICD-10-CM | POA: Diagnosis not present

## 2023-07-29 DIAGNOSIS — E1142 Type 2 diabetes mellitus with diabetic polyneuropathy: Secondary | ICD-10-CM | POA: Insufficient documentation

## 2023-07-29 DIAGNOSIS — Z1379 Encounter for other screening for genetic and chromosomal anomalies: Secondary | ICD-10-CM | POA: Diagnosis not present

## 2023-07-29 DIAGNOSIS — D696 Thrombocytopenia, unspecified: Secondary | ICD-10-CM | POA: Insufficient documentation

## 2023-07-29 DIAGNOSIS — Z79899 Other long term (current) drug therapy: Secondary | ICD-10-CM | POA: Insufficient documentation

## 2023-07-29 DIAGNOSIS — M6281 Muscle weakness (generalized): Secondary | ICD-10-CM | POA: Diagnosis not present

## 2023-07-29 DIAGNOSIS — R7989 Other specified abnormal findings of blood chemistry: Secondary | ICD-10-CM

## 2023-07-29 DIAGNOSIS — R531 Weakness: Principal | ICD-10-CM

## 2023-07-29 DIAGNOSIS — G629 Polyneuropathy, unspecified: Secondary | ICD-10-CM

## 2023-07-29 DIAGNOSIS — E1122 Type 2 diabetes mellitus with diabetic chronic kidney disease: Secondary | ICD-10-CM | POA: Diagnosis not present

## 2023-07-29 DIAGNOSIS — R079 Chest pain, unspecified: Secondary | ICD-10-CM

## 2023-07-29 DIAGNOSIS — Z7901 Long term (current) use of anticoagulants: Secondary | ICD-10-CM | POA: Insufficient documentation

## 2023-07-29 LAB — COMPREHENSIVE METABOLIC PANEL WITH GFR
ALT: 21 U/L (ref 0–44)
AST: 36 U/L (ref 15–41)
Albumin: 4 g/dL (ref 3.5–5.0)
Alkaline Phosphatase: 63 U/L (ref 38–126)
Anion gap: 19 — ABNORMAL HIGH (ref 5–15)
BUN: 25 mg/dL — ABNORMAL HIGH (ref 6–20)
CO2: 21 mmol/L — ABNORMAL LOW (ref 22–32)
Calcium: 9.8 mg/dL (ref 8.9–10.3)
Chloride: 94 mmol/L — ABNORMAL LOW (ref 98–111)
Creatinine, Ser: 4.98 mg/dL — ABNORMAL HIGH (ref 0.44–1.00)
GFR, Estimated: 9 mL/min — ABNORMAL LOW (ref >60)
Glucose, Bld: 88 mg/dL (ref 70–99)
Potassium: 3.9 mmol/L (ref 3.5–5.1)
Sodium: 134 mmol/L — ABNORMAL LOW (ref 135–145)
Total Bilirubin: 1.4 mg/dL — ABNORMAL HIGH (ref 0.0–1.2)
Total Protein: 7.9 g/dL (ref 6.5–8.1)

## 2023-07-29 LAB — CBC
HCT: 41.4 % (ref 36.0–46.0)
Hemoglobin: 13 g/dL (ref 12.0–15.0)
MCH: 29.1 pg (ref 26.0–34.0)
MCHC: 31.4 g/dL (ref 30.0–36.0)
MCV: 92.6 fL (ref 80.0–100.0)
Platelets: 21 10*3/uL — CL (ref 150–400)
RBC: 4.47 MIL/uL (ref 3.87–5.11)
RDW: 20.8 % — ABNORMAL HIGH (ref 11.5–15.5)
WBC: 2.6 10*3/uL — ABNORMAL LOW (ref 4.0–10.5)
nRBC: 1.2 % — ABNORMAL HIGH (ref 0.0–0.2)

## 2023-07-29 LAB — TROPONIN I (HIGH SENSITIVITY): Troponin I (High Sensitivity): 40 ng/L — ABNORMAL HIGH (ref <18)

## 2023-07-29 LAB — LIPASE, BLOOD: Lipase: 73 U/L — ABNORMAL HIGH (ref 11–51)

## 2023-07-29 MED ORDER — LACTATED RINGERS IV BOLUS
500.0000 mL | Freq: Once | INTRAVENOUS | Status: AC
  Administered 2023-07-29: 500 mL via INTRAVENOUS

## 2023-07-29 NOTE — ED Provider Notes (Signed)
 Tonya Myers Provider Note    None    (approximate)   History   No chief complaint on file.   HPI  Tonya Myers is a 58 y.o. female history of ESRD on dialysis, history of CLL, diabetes, depression, presenting with generalized weakness.  Patient states that she completed her dialysis sessions this week, last was today.  States that she will walk around her house yesterday, has not had much to eat or drink for the last 2 days.  She has dysuria but no hematuria.  Was feeling some nausea but no vomiting or diarrhea, no chest pain or shortness of breath, no cough.  States that she has been having pain in her hands and feet that has been ongoing for a long time.  States that she had a fall slightly more than a week ago.  No headache, no new pain.  On independent chart review, she was admitted in April for diarrhea, weakness, confusion and fever.  At that time was complaining about vaginal burning, external exam was done by physician without any herpetic findings.  She was discharged with Valtrex.     Physical Exam   Triage Vital Signs: ED Triage Vitals  Encounter Vitals Group     BP 07/29/23 1841 106/69     Systolic BP Percentile --      Diastolic BP Percentile --      Pulse Rate 07/29/23 1841 95     Resp 07/29/23 1841 15     Temp 07/29/23 1841 98.8 F (37.1 C)     Temp Source 07/29/23 1841 Oral     SpO2 07/29/23 1841 100 %     Weight 07/29/23 1842 193 lb 9 oz (87.8 kg)     Height --      Head Circumference --      Peak Flow --      Pain Score 07/29/23 1842 7     Pain Loc --      Pain Education --      Exclude from Growth Chart --     Most recent vital signs: Vitals:   08/03/23 1145 08/03/23 1158  BP: 123/72 129/77  Pulse: 65 60  Resp: 14 12  Temp: 98 F (36.7 C)   SpO2: 94% 98%     General: Awake, no distress.  CV:  Good peripheral perfusion.  Resp:  Normal effort.  Clear Abd:  No distention.  Nontender Other:  No palpable  skull deformities or tenderness, no tenderness to extremities, no midline spinal tenderness, thoracic cage is nontender to palpation.  She is equal DP pulses and radial pulses bilaterally, no unilateral calf swelling or tenderness.  No focal weakness or numbness.   ED Results / Procedures / Treatments   Labs (all labs ordered are listed, but only abnormal results are displayed) Labs Reviewed  LIPASE, BLOOD - Abnormal; Notable for the following components:      Result Value   Lipase 73 (*)    All other components within normal limits  COMPREHENSIVE METABOLIC PANEL WITH GFR - Abnormal; Notable for the following components:   Sodium 134 (*)    Chloride 94 (*)    CO2 21 (*)    BUN 25 (*)    Creatinine, Ser 4.98 (*)    Total Bilirubin 1.4 (*)    GFR, Estimated 9 (*)    Anion gap 19 (*)    All other components within normal limits  CBC - Abnormal; Notable for  the following components:   WBC 2.6 (*)    RDW 20.8 (*)    Platelets 21 (*)    nRBC 1.2 (*)    All other components within normal limits  BASIC METABOLIC PANEL WITH GFR - Abnormal; Notable for the following components:   Chloride 97 (*)    BUN 33 (*)    Creatinine, Ser 6.12 (*)    GFR, Estimated 7 (*)    All other components within normal limits  CBC - Abnormal; Notable for the following components:   WBC 2.1 (*)    RDW 21.0 (*)    Platelets 31 (*)    nRBC 1.0 (*)    All other components within normal limits  URINALYSIS, ROUTINE W REFLEX MICROSCOPIC - Abnormal; Notable for the following components:   Color, Urine AMBER (*)    APPearance CLOUDY (*)    Hgb urine dipstick SMALL (*)    Protein, ur 100 (*)    Leukocytes,Ua LARGE (*)    Bacteria, UA MANY (*)    All other components within normal limits  GLUCOSE, CAPILLARY - Abnormal; Notable for the following components:   Glucose-Capillary 109 (*)    All other components within normal limits  GLUCOSE, CAPILLARY - Abnormal; Notable for the following components:    Glucose-Capillary 101 (*)    All other components within normal limits  CBC - Abnormal; Notable for the following components:   WBC 3.0 (*)    RDW 21.2 (*)    Platelets 36 (*)    All other components within normal limits  BASIC METABOLIC PANEL WITH GFR - Abnormal; Notable for the following components:   Chloride 96 (*)    Glucose, Bld 126 (*)    BUN 62 (*)    Creatinine, Ser 8.79 (*)    GFR, Estimated 5 (*)    Anion gap 17 (*)    All other components within normal limits  HEPATITIS B SURFACE ANTIBODY, QUANTITATIVE - Abnormal; Notable for the following components:   Hep B S AB Quant (Post) 9.8 (*)    All other components within normal limits  GLUCOSE, CAPILLARY - Abnormal; Notable for the following components:   Glucose-Capillary 100 (*)    All other components within normal limits  GLUCOSE, CAPILLARY - Abnormal; Notable for the following components:   Glucose-Capillary 118 (*)    All other components within normal limits  CBC - Abnormal; Notable for the following components:   WBC 3.0 (*)    RDW 21.2 (*)    Platelets 24 (*)    All other components within normal limits  BASIC METABOLIC PANEL WITH GFR - Abnormal; Notable for the following components:   CO2 20 (*)    BUN 75 (*)    Creatinine, Ser 10.42 (*)    Calcium 8.7 (*)    GFR, Estimated 4 (*)    Anion gap 19 (*)    All other components within normal limits  GLUCOSE, CAPILLARY - Abnormal; Notable for the following components:   Glucose-Capillary 138 (*)    All other components within normal limits  URINALYSIS, ROUTINE W REFLEX MICROSCOPIC - Abnormal; Notable for the following components:   Color, Urine YELLOW (*)    APPearance CLOUDY (*)    pH 9.0 (*)    Hgb urine dipstick SMALL (*)    Protein, ur 100 (*)    Leukocytes,Ua LARGE (*)    Bacteria, UA FEW (*)    All other components within normal limits  GLUCOSE, CAPILLARY -  Abnormal; Notable for the following components:   Glucose-Capillary 63 (*)    All other  components within normal limits  CBC - Abnormal; Notable for the following components:   WBC 2.6 (*)    RDW 20.9 (*)    Platelets 25 (*)    All other components within normal limits  BASIC METABOLIC PANEL WITH GFR - Abnormal; Notable for the following components:   Sodium 133 (*)    Chloride 93 (*)    BUN 43 (*)    Creatinine, Ser 6.96 (*)    Calcium 8.7 (*)    GFR, Estimated 6 (*)    All other components within normal limits  GLUCOSE, CAPILLARY - Abnormal; Notable for the following components:   Glucose-Capillary 117 (*)    All other components within normal limits  CBC - Abnormal; Notable for the following components:   WBC 2.7 (*)    RBC 3.86 (*)    Hemoglobin 11.4 (*)    HCT 35.6 (*)    RDW 20.8 (*)    Platelets 29 (*)    All other components within normal limits  BASIC METABOLIC PANEL WITH GFR - Abnormal; Notable for the following components:   Sodium 134 (*)    Chloride 96 (*)    Glucose, Bld 113 (*)    BUN 64 (*)    Creatinine, Ser 9.16 (*)    Calcium 8.5 (*)    GFR, Estimated 5 (*)    All other components within normal limits  GLUCOSE, CAPILLARY - Abnormal; Notable for the following components:   Glucose-Capillary 100 (*)    All other components within normal limits  CBG MONITORING, ED - Abnormal; Notable for the following components:   Glucose-Capillary 104 (*)    All other components within normal limits  CBG MONITORING, ED - Abnormal; Notable for the following components:   Glucose-Capillary 103 (*)    All other components within normal limits  TROPONIN I (HIGH SENSITIVITY) - Abnormal; Notable for the following components:   Troponin I (High Sensitivity) 40 (*)    All other components within normal limits  TROPONIN I (HIGH SENSITIVITY) - Abnormal; Notable for the following components:   Troponin I (High Sensitivity) 75 (*)    All other components within normal limits  TROPONIN I (HIGH SENSITIVITY) - Abnormal; Notable for the following components:    Troponin I (High Sensitivity) 194 (*)    All other components within normal limits  TROPONIN I (HIGH SENSITIVITY) - Abnormal; Notable for the following components:   Troponin I (High Sensitivity) 171 (*)    All other components within normal limits  HEMOGLOBIN A1C  GLUCOSE, CAPILLARY  HEPATITIS B SURFACE ANTIGEN  GLUCOSE, CAPILLARY  GLUCOSE, CAPILLARY  GLUCOSE, CAPILLARY  SURGICAL PATHOLOGY     EKG  EKG showed sinus tachycardia, V6 is missing, otherwise no ischemic ST elevation, T wave flattening in aVL, T wave changes new compared to prior   RADIOLOGY Chest x-ray without obvious consolidation on my independent interpretation.   PROCEDURES:  Critical Care performed: No  Procedures   MEDICATIONS ORDERED IN ED: Medications  atorvastatin (LIPITOR) tablet 20 mg (20 mg Oral Given 08/03/23 1005)  hydrOXYzine (ATARAX) tablet 25 mg (has no administration in time range)  levothyroxine (SYNTHROID) tablet 125 mcg (125 mcg Oral Given 08/03/23 1123)  calcium acetate (PHOSLO) capsule 1,334 mg (1,334 mg Oral Not Given 08/03/23 1123)  pantoprazole (PROTONIX) EC tablet 40 mg (40 mg Oral Given 08/03/23 1006)  senna-docusate (Senokot-S) tablet 2 tablet (  2 tablets Oral Patient Refused/Not Given 08/02/23 2105)  folic acid (FOLVITE) tablet 1 mg (1 mg Oral Given 08/03/23 1005)  ascorbic acid (VITAMIN C) tablet 500 mg (500 mg Oral Given 08/03/23 1006)  multivitamin (RENA-VIT) tablet 1 tablet (1 tablet Oral Given 08/02/23 2105)  zinc sulfate (50mg  elemental zinc) capsule 220 mg (220 mg Oral Given 08/03/23 1005)  fluticasone (FLONASE) 50 MCG/ACT nasal spray 2 spray (2 sprays Each Nare Given 08/03/23 1008)  acetaminophen (TYLENOL) tablet 650 mg (has no administration in time range)    Or  acetaminophen (TYLENOL) suppository 650 mg (has no administration in time range)  traZODone (DESYREL) tablet 25 mg (has no administration in time range)  magnesium hydroxide (MILK OF MAGNESIA) suspension 30 mL (has no  administration in time range)  ondansetron (ZOFRAN) tablet 4 mg ( Oral See Alternative 08/03/23 1155)    Or  ondansetron (ZOFRAN) injection 4 mg (4 mg Intravenous Given 08/03/23 1155)  albuterol (PROVENTIL) (2.5 MG/3ML) 0.083% nebulizer solution 2.5 mg (has no administration in time range)  oxyCODONE-acetaminophen (PERCOCET/ROXICET) 5-325 MG per tablet 1 tablet (1 tablet Oral Given 08/03/23 1010)  morphine (PF) 2 MG/ML injection 1 mg (1 mg Intravenous Given 07/31/23 2014)  midodrine (PROAMATINE) tablet 5 mg (5 mg Oral Given 08/03/23 1005)  Chlorhexidine Gluconate Cloth 2 % PADS 6 each (6 each Topical Given 08/03/23 0630)  gabapentin (NEURONTIN) capsule 100 mg (100 mg Oral Given 08/03/23 1005)  heparin injection 1,000 Units (has no administration in time range)  pentafluoroprop-tetrafluoroeth (GEBAUERS) aerosol 1 Application (has no administration in time range)  lidocaine-prilocaine (EMLA) cream 1 Application (has no administration in time range)  lactated ringers bolus 500 mL (0 mLs Intravenous Stopped 07/29/23 2343)  aspirin chewable tablet 324 mg (324 mg Oral Given 07/30/23 0103)  loperamide (IMODIUM) capsule 2 mg (2 mg Oral Given 07/30/23 1343)  valACYclovir (VALTREX) tablet 500 mg (500 mg Oral Given 08/03/23 1006)  lidocaine (XYLOCAINE) 1 % (with pres) injection 10 mL (0 mLs Other Duplicate 08/03/23 1051)  fentaNYL (SUBLIMAZE) injection (50 mcg Intravenous Given 08/03/23 0843)  midazolam (VERSED) injection (1 mg Intravenous Given 08/03/23 0843)     IMPRESSION / MDM / ASSESSMENT AND PLAN / ED COURSE  I reviewed the triage vital signs and the nursing notes.                              Differential diagnosis includes, but is not limited to, UTI, cystitis, electrolyte derangements, viral illness, dehydration, atypical ACS.  Will get labs, EKG, troponin, chest x-ray, UA.  Will give her some IV fluids.  Initially was planning to do a CT head since she reported a fall last week but patient declined, states that she  had imaging done when she fell last week already.  No headache, no focal weakness or numbness.  She is not altered.   Patient's presentation is most consistent with acute presentation with potential threat to life or bodily function.  Independent review of labs imaging are below.  Patient signed out to oncoming team pending IV fluids, repeat Trope, UA.  Ambulation trial.  If workup is negative and patient is able to ambulate without difficulty, likely able to be discharged home.  Clinical Course as of 08/03/23 1216  Fri Jul 29, 2023  2223 On independent chart review, she has leukopenia which appears chronic, platelets are 21, this also appears chronic, she has no evidence of bleeding at this time, electrolytes not  severely deranged, creatinine is elevated but she has history of ESRD on dialysis, troponin is mildly elevated, go get a repeat.  No chest pain or shortness of breath at this time. [TT]  2224 DG Chest 2 View No active cardiopulmonary disease.  [TT]    Clinical Course User Index [TT] Jodie Echevaria Franchot Erichsen, MD     FINAL CLINICAL IMPRESSION(S) / ED DIAGNOSES   Final diagnoses:  Weakness  Chest pain, unspecified type  Elevated troponin  Unsteady gait     Rx / DC Orders   ED Discharge Orders          Ordered    valACYclovir (VALTREX) 500 MG tablet  Daily        08/03/23 1117    midodrine (PROAMATINE) 2.5 MG tablet  3 times weekly        08/03/23 1117    ondansetron (ZOFRAN-ODT) 4 MG disintegrating tablet  Every 8 hours PRN        08/03/23 1117    Increase activity slowly        08/03/23 1117    Diet - low sodium heart healthy        08/03/23 1117    No dressing needed        08/03/23 1117             Note:  This document was prepared using Dragon voice recognition software and may include unintentional dictation errors.    Claybon Jabs, MD 08/03/23 [phone]

## 2023-07-29 NOTE — ED Notes (Signed)
 Unable to draw blood, attempted x 1.  Small amount of blood collected, sent to lab in light green tube.  Phlebotomy called for blood draw.

## 2023-07-29 NOTE — ED Triage Notes (Signed)
 States BP was low at dialysis and patient is concerned about that. Also Feet and hands hurt.  Patient states "I feel nauseous, weak, and sick".  VS wnl:  124 74 87 18 99%

## 2023-07-29 NOTE — ED Provider Notes (Signed)
 11:30 PM  Assumed care of patient at shift change.  Patient here for generalized weakness.  History of end-stage renal disease on hemodialysis.  Second troponin pending.  Urine was initially ordered but patient does not make urine.  Will p.o. challenge, ambulate.  12:50 AM  Pt's second troponin is rising.  She does have history of chronic kidney disease so this could be the cause of her rising troponin however she does report having chest pain that felt like a "balloon blowing up in my chest" that has resolved.  Will give aspirin.  She is on cardiac monitoring.  Patient also reports still feeling very weak and is unsteady on her feet.  Will discuss with hospitalist for admission.  12:55 AM  Consulted and discussed patient's case with hospitalist, Dr. Arville Care.  I have recommended admission and consulting physician agrees and will place admission orders.  Patient (and family if present) agree with this plan.   I reviewed all nursing notes, vitals, pertinent previous records.  All labs, EKGs, imaging ordered have been independently reviewed and interpreted by myself.    Marquese Burkland, Layla Maw, DO 07/30/23 [phone]

## 2023-07-30 ENCOUNTER — Encounter: Payer: Self-pay | Admitting: Family Medicine

## 2023-07-30 DIAGNOSIS — G629 Polyneuropathy, unspecified: Secondary | ICD-10-CM

## 2023-07-30 DIAGNOSIS — R0600 Dyspnea, unspecified: Secondary | ICD-10-CM | POA: Diagnosis not present

## 2023-07-30 DIAGNOSIS — R7989 Other specified abnormal findings of blood chemistry: Secondary | ICD-10-CM

## 2023-07-30 DIAGNOSIS — I10 Essential (primary) hypertension: Secondary | ICD-10-CM

## 2023-07-30 DIAGNOSIS — N186 End stage renal disease: Secondary | ICD-10-CM | POA: Diagnosis not present

## 2023-07-30 DIAGNOSIS — R55 Syncope and collapse: Secondary | ICD-10-CM | POA: Diagnosis not present

## 2023-07-30 DIAGNOSIS — E785 Hyperlipidemia, unspecified: Secondary | ICD-10-CM

## 2023-07-30 DIAGNOSIS — R079 Chest pain, unspecified: Secondary | ICD-10-CM | POA: Insufficient documentation

## 2023-07-30 LAB — CBC
HCT: 39.7 % (ref 36.0–46.0)
Hemoglobin: 12.6 g/dL (ref 12.0–15.0)
MCH: 30.1 pg (ref 26.0–34.0)
MCHC: 31.7 g/dL (ref 30.0–36.0)
MCV: 94.7 fL (ref 80.0–100.0)
Platelets: 31 10*3/uL — ABNORMAL LOW (ref 150–400)
RBC: 4.19 MIL/uL (ref 3.87–5.11)
RDW: 21 % — ABNORMAL HIGH (ref 11.5–15.5)
WBC: 2.1 10*3/uL — ABNORMAL LOW (ref 4.0–10.5)
nRBC: 1 % — ABNORMAL HIGH (ref 0.0–0.2)

## 2023-07-30 LAB — GLUCOSE, CAPILLARY
Glucose-Capillary: 109 mg/dL — ABNORMAL HIGH (ref 70–99)
Glucose-Capillary: 101 mg/dL — ABNORMAL HIGH (ref 70–99)

## 2023-07-30 LAB — URINALYSIS, ROUTINE W REFLEX MICROSCOPIC
Bilirubin Urine: NEGATIVE
Glucose, UA: NEGATIVE mg/dL
Ketones, ur: NEGATIVE mg/dL
Nitrite: NEGATIVE
Protein, ur: 100 mg/dL — AB
Specific Gravity, Urine: 1.013 (ref 1.005–1.030)
Squamous Epithelial / HPF: >50 /HPF (ref 0–5)
WBC, UA: >50 WBC/hpf (ref 0–5)
pH: 7 (ref 5.0–8.0)

## 2023-07-30 LAB — BASIC METABOLIC PANEL WITH GFR
Anion gap: 15 (ref 5–15)
BUN: 33 mg/dL — ABNORMAL HIGH (ref 6–20)
CO2: 24 mmol/L (ref 22–32)
Calcium: 9.2 mg/dL (ref 8.9–10.3)
Chloride: 97 mmol/L — ABNORMAL LOW (ref 98–111)
Creatinine, Ser: 6.12 mg/dL — ABNORMAL HIGH (ref 0.44–1.00)
GFR, Estimated: 7 mL/min — ABNORMAL LOW (ref >60)
Glucose, Bld: 93 mg/dL (ref 70–99)
Potassium: 3.6 mmol/L (ref 3.5–5.1)
Sodium: 136 mmol/L (ref 135–145)

## 2023-07-30 LAB — TROPONIN I (HIGH SENSITIVITY)
Troponin I (High Sensitivity): 171 ng/L (ref <18)
Troponin I (High Sensitivity): 194 ng/L (ref <18)
Troponin I (High Sensitivity): 75 ng/L — ABNORMAL HIGH (ref <18)

## 2023-07-30 LAB — CBG MONITORING, ED
Glucose-Capillary: 104 mg/dL — ABNORMAL HIGH (ref 70–99)
Glucose-Capillary: 103 mg/dL — ABNORMAL HIGH (ref 70–99)

## 2023-07-30 MED ORDER — FUROSEMIDE 40 MG PO TABS
80.0000 mg | ORAL_TABLET | Freq: Every day | ORAL | Status: DC
  Administered 2023-07-31: 80 mg via ORAL
  Filled 2023-07-30 (×2): qty 2

## 2023-07-30 MED ORDER — ONDANSETRON HCL 4 MG/2ML IJ SOLN
4.0000 mg | Freq: Four times a day (QID) | INTRAMUSCULAR | Status: DC | PRN
  Administered 2023-07-30 – 2023-08-03 (×2): 4 mg via INTRAVENOUS
  Filled 2023-07-30: qty 2

## 2023-07-30 MED ORDER — HEPARIN SODIUM (PORCINE) 5000 UNIT/ML IJ SOLN: 5000.0000 [IU] | Freq: Three times a day (TID) | INTRAMUSCULAR | Status: DC

## 2023-07-30 MED ORDER — SODIUM ZIRCONIUM CYCLOSILICATE 10 G PO PACK: 10.0000 g | PACK | ORAL | Status: DC

## 2023-07-30 MED ORDER — FOLIC ACID 1 MG PO TABS
1.0000 mg | ORAL_TABLET | Freq: Every day | ORAL | Status: DC
  Administered 2023-07-30 – 2023-08-03 (×5): 1 mg via ORAL
  Filled 2023-07-30 (×5): qty 1

## 2023-07-30 MED ORDER — LANTHANUM CARBONATE 500 MG PO CHEW: 1000.0000 mg | CHEWABLE_TABLET | Freq: Three times a day (TID) | ORAL | Status: DC

## 2023-07-30 MED ORDER — INSULIN ASPART 100 UNIT/ML IJ SOLN: 0.0000 [IU] | Freq: Three times a day (TID) | INTRAMUSCULAR | Status: DC

## 2023-07-30 MED ORDER — ASPIRIN 81 MG PO TBEC: 81.0000 mg | DELAYED_RELEASE_TABLET | Freq: Every day | ORAL | Status: DC

## 2023-07-30 MED ORDER — VALACYCLOVIR HCL 500 MG PO TABS
1000.0000 mg | ORAL_TABLET | Freq: Three times a day (TID) | ORAL | Status: DC
  Administered 2023-07-30 – 2023-08-01 (×7): 1000 mg via ORAL
  Filled 2023-07-30 (×9): qty 2

## 2023-07-30 MED ORDER — VITAMIN C 500 MG PO TABS
500.0000 mg | ORAL_TABLET | Freq: Two times a day (BID) | ORAL | Status: DC
  Administered 2023-07-30 – 2023-08-03 (×9): 500 mg via ORAL
  Filled 2023-07-30 (×9): qty 1

## 2023-07-30 MED ORDER — ALBUTEROL SULFATE HFA 108 (90 BASE) MCG/ACT IN AERS: 2.0000 | INHALATION_SPRAY | Freq: Four times a day (QID) | RESPIRATORY_TRACT | Status: DC | PRN

## 2023-07-30 MED ORDER — PANTOPRAZOLE SODIUM 40 MG PO TBEC
40.0000 mg | DELAYED_RELEASE_TABLET | Freq: Every day | ORAL | Status: DC
  Administered 2023-07-30 – 2023-08-03 (×5): 40 mg via ORAL
  Filled 2023-07-30 (×5): qty 1

## 2023-07-30 MED ORDER — RENA-VITE PO TABS
1.0000 | ORAL_TABLET | Freq: Every day | ORAL | Status: DC
  Administered 2023-07-30 – 2023-08-02 (×4): 1 via ORAL
  Filled 2023-07-30 (×5): qty 1

## 2023-07-30 MED ORDER — SENNOSIDES-DOCUSATE SODIUM 8.6-50 MG PO TABS
2.0000 | ORAL_TABLET | Freq: Every day | ORAL | Status: DC
  Filled 2023-07-30 (×3): qty 2

## 2023-07-30 MED ORDER — ZINC SULFATE 220 (50 ZN) MG PO CAPS
220.0000 mg | ORAL_CAPSULE | Freq: Every day | ORAL | Status: DC
  Administered 2023-07-30 – 2023-08-03 (×5): 220 mg via ORAL
  Filled 2023-07-30 (×5): qty 1

## 2023-07-30 MED ORDER — TRAZODONE HCL 50 MG PO TABS: 25.0000 mg | ORAL_TABLET | Freq: Every evening | ORAL | Status: DC | PRN

## 2023-07-30 MED ORDER — INSULIN ASPART 100 UNIT/ML IJ SOLN: 0.0000 [IU] | Freq: Every day | INTRAMUSCULAR | Status: DC

## 2023-07-30 MED ORDER — ACETAMINOPHEN 325 MG PO TABS: 650.0000 mg | ORAL_TABLET | Freq: Four times a day (QID) | ORAL | Status: DC | PRN

## 2023-07-30 MED ORDER — HYDROXYZINE HCL 25 MG PO TABS
25.0000 mg | ORAL_TABLET | Freq: Three times a day (TID) | ORAL | Status: DC | PRN
Start: 2023-07-30 — End: 2023-08-04

## 2023-07-30 MED ORDER — ALBUTEROL SULFATE (2.5 MG/3ML) 0.083% IN NEBU: 2.5000 mg | INHALATION_SOLUTION | Freq: Four times a day (QID) | RESPIRATORY_TRACT | Status: DC | PRN

## 2023-07-30 MED ORDER — GABAPENTIN 300 MG PO CAPS: 300.0000 mg | ORAL_CAPSULE | Freq: Every day | ORAL | Status: DC

## 2023-07-30 MED ORDER — OXYCODONE-ACETAMINOPHEN 5-325 MG PO TABS
1.0000 | ORAL_TABLET | Freq: Four times a day (QID) | ORAL | Status: DC | PRN
  Administered 2023-08-01 – 2023-08-03 (×3): 1 via ORAL
  Filled 2023-07-30 (×3): qty 1

## 2023-07-30 MED ORDER — GABAPENTIN 300 MG PO CAPS
300.0000 mg | ORAL_CAPSULE | Freq: Three times a day (TID) | ORAL | Status: DC
  Administered 2023-07-30: 300 mg via ORAL
  Filled 2023-07-30: qty 1

## 2023-07-30 MED ORDER — LOPERAMIDE HCL 2 MG PO CAPS
2.0000 mg | ORAL_CAPSULE | Freq: Once | ORAL | Status: AC
  Administered 2023-07-30: 2 mg via ORAL
  Filled 2023-07-30: qty 1

## 2023-07-30 MED ORDER — MIDODRINE HCL 5 MG PO TABS: 2.5000 mg | ORAL_TABLET | ORAL | Status: DC

## 2023-07-30 MED ORDER — ONDANSETRON HCL 4 MG PO TABS: 4.0000 mg | ORAL_TABLET | Freq: Four times a day (QID) | ORAL | Status: DC | PRN

## 2023-07-30 MED ORDER — PROPRANOLOL HCL 20 MG PO TABS
20.0000 mg | ORAL_TABLET | Freq: Three times a day (TID) | ORAL | Status: DC
  Administered 2023-07-30 – 2023-07-31 (×3): 20 mg via ORAL
  Filled 2023-07-30 (×4): qty 1

## 2023-07-30 MED ORDER — ASPIRIN 81 MG PO CHEW
324.0000 mg | CHEWABLE_TABLET | Freq: Once | ORAL | Status: AC
  Administered 2023-07-30: 324 mg via ORAL
  Filled 2023-07-30: qty 4

## 2023-07-30 MED ORDER — MORPHINE SULFATE (PF) 2 MG/ML IV SOLN
1.0000 mg | INTRAVENOUS | Status: DC | PRN
  Administered 2023-07-30 – 2023-07-31 (×3): 1 mg via INTRAVENOUS
  Filled 2023-07-30 (×3): qty 1

## 2023-07-30 MED ORDER — ATORVASTATIN CALCIUM 20 MG PO TABS
20.0000 mg | ORAL_TABLET | Freq: Every day | ORAL | Status: DC
  Administered 2023-07-30 – 2023-08-03 (×5): 20 mg via ORAL
  Filled 2023-07-30 (×5): qty 1

## 2023-07-30 MED ORDER — IRBESARTAN 150 MG PO TABS
75.0000 mg | ORAL_TABLET | Freq: Every day | ORAL | Status: DC
  Administered 2023-07-30: 75 mg via ORAL
  Filled 2023-07-30 (×2): qty 1

## 2023-07-30 MED ORDER — MAGNESIUM HYDROXIDE 400 MG/5ML PO SUSP: 30.0000 mL | Freq: Every day | ORAL | Status: DC | PRN

## 2023-07-30 MED ORDER — HYDROCHLOROTHIAZIDE 12.5 MG PO TABS
12.5000 mg | ORAL_TABLET | Freq: Every day | ORAL | Status: DC
  Administered 2023-07-30: 12.5 mg via ORAL
  Filled 2023-07-30 (×2): qty 1

## 2023-07-30 MED ORDER — CALCIUM ACETATE (PHOS BINDER) 667 MG PO CAPS
1334.0000 mg | ORAL_CAPSULE | Freq: Three times a day (TID) | ORAL | Status: DC
  Administered 2023-07-30 – 2023-08-03 (×12): 1334 mg via ORAL
  Filled 2023-07-30 (×13): qty 2

## 2023-07-30 MED ORDER — ACETAMINOPHEN 650 MG RE SUPP: 650.0000 mg | Freq: Four times a day (QID) | RECTAL | Status: DC | PRN

## 2023-07-30 MED ORDER — LEVOTHYROXINE SODIUM 125 MCG PO TABS
125.0000 ug | ORAL_TABLET | Freq: Every day | ORAL | Status: DC
  Administered 2023-07-30 – 2023-08-03 (×5): 125 ug via ORAL
  Filled 2023-07-30 (×5): qty 1

## 2023-07-30 MED ORDER — FLUTICASONE PROPIONATE 50 MCG/ACT NA SUSP
2.0000 | Freq: Every day | NASAL | Status: DC
  Administered 2023-07-31 – 2023-08-03 (×4): 2 via NASAL
  Filled 2023-07-30 (×2): qty 16

## 2023-07-30 MED ORDER — MIDODRINE HCL 5 MG PO TABS
5.0000 mg | ORAL_TABLET | ORAL | Status: DC
Start: 2023-08-01 — End: 2023-08-04
  Administered 2023-08-01 – 2023-08-03 (×2): 5 mg via ORAL
  Filled 2023-07-30 (×2): qty 1

## 2023-07-30 MED ORDER — LORATADINE 10 MG PO TABS
10.0000 mg | ORAL_TABLET | Freq: Every day | ORAL | Status: DC
  Administered 2023-07-30 – 2023-07-31 (×2): 10 mg via ORAL
  Filled 2023-07-30 (×2): qty 1

## 2023-07-30 NOTE — Assessment & Plan Note (Signed)
 -  We will continue statin therapy.

## 2023-07-30 NOTE — Assessment & Plan Note (Signed)
 -  We will continue PPI therapy

## 2023-07-30 NOTE — Assessment & Plan Note (Signed)
-   Nephrology consult will be obtained to follow-up on hemodialysis.

## 2023-07-30 NOTE — Assessment & Plan Note (Signed)
-   This is associated with near syncope elevated troponin, will need to rule out ACS

## 2023-07-30 NOTE — Progress Notes (Signed)
 PROGRESS NOTE    Tonya Myers  UJW:[id number] DOB: [birth year] DOA: 07/29/2023 PCP: [name], MD  Assessment & Plan:   Principal Problem:   Near syncope Active Problems:   Essential hypertension   ESRD (end stage renal disease) (HCC)   Dyslipidemia   GERD without esophagitis   Peripheral neuropathy  Assessment and Plan: Near syncope: etiology unclear. Orthostatics are neg. Continue on tele  Myocardial injury: w/ elevated troponins, likely secondary to demand ischemia.    ESRD: on HD. Nephro consulted    HTN: continue on home dose of irbesartan, HCTZ   HLD: continue on statin    Peripheral neuropathy: continue on gabapentin    GERD: continue on PPI  Thrombocytopenia: significant. Etiology unclear. Holding aspirin & heparin SQ   Generalized weakness: PT/OT consulted     DVT prophylaxis: SCDs only secondary to thrombocytopenia Code Status: full  Family Communication:  Disposition Plan: depends on PT/OT recs   Level of care: Telemetry Cardiac  Status is: Observation The patient remains OBS appropriate and will d/c before 2 midnights.    Consultants:  Nephro   Procedures:   Antimicrobials:   Subjective: Pt c/o generalized weakness  Objective: Vitals:   07/30/23 0400 07/30/23 0500 07/30/23 0525 07/30/23 0636  BP: 106/69 97/61 103/70 96/72  Pulse: 90 98 94 92  Resp:  18 16 16   Temp:   98.8 F (37.1 C)   TempSrc:   Oral   SpO2: 96% 98% 100% 100%  Weight:       No intake or output data in the 24 hours ending 07/30/23 0908 Filed Weights   07/29/23 1842  Weight: 87.8 kg    Examination:  General exam: Appears calm and comfortable  Respiratory system: Clear to auscultation. Respiratory effort normal. Cardiovascular system: S1 & S2 +. No rubs, gallops or clicks.  Gastrointestinal system: Abdomen is nondistended, soft and nontender. Normal bowel sounds heard. Central nervous system: Alert and oriented. Moves all extremities   Psychiatry: Judgement and insight appear normal. Flat mood and affect    Data Reviewed: I have personally reviewed following labs and imaging studies  CBC: Recent Labs  Lab 07/25/23 0142 07/26/23 0422 07/27/23 0511 07/29/23 1930 07/30/23 0716  WBC 5.1 3.1* 2.8* 2.6* 2.1*  HGB 11.7* 11.2* 11.4* 13.0 12.6  HCT 38.3 35.3* 36.6 41.4 39.7  MCV 93.9 91.7 93.6 92.6 94.7  PLT 42* 35* 33* 21* 31*   Basic Metabolic Panel: Recent Labs  Lab 07/25/23 0142 07/26/23 0422 07/27/23 0811 07/29/23 1930 07/30/23 0716  NA 139 134* 134* 134* 136  K 5.5* 5.0 4.7 3.9 3.6  CL 99 95* 94* 94* 97*  CO2 21* 27 23 21* 24  GLUCOSE 133* 94 89 88 93  BUN 96* 45* 70* 25* 33*  CREATININE 9.72* 6.43* 9.49* 4.98* 6.12*  CALCIUM 9.8 8.6* 8.3* 9.8 9.2  PHOS  --   --  8.1*  --   --    GFR: Estimated Creatinine Clearance: 12.3 mL/min (A) (by C-G formula based on SCr of 6.12 mg/dL (H)). Liver Function Tests: Recent Labs  Lab 07/25/23 0142 07/26/23 0422 07/27/23 0811 07/29/23 1930  AST 24 23  --  36  ALT 21 16  --  21  ALKPHOS 58 58  --  63  BILITOT 0.8 0.9  --  1.4*  PROT 7.4 7.1  --  7.9  ALBUMIN 3.8 3.5 3.2* 4.0   Recent Labs  Lab 07/25/23 0142 07/29/23 1930  LIPASE 96* 73*  Recent Labs  Lab 07/25/23 0925  AMMONIA 28   Coagulation Profile: No results for input(s): "INR", "PROTIME" in the last 168 hours. Cardiac Enzymes: No results for input(s): "CKTOTAL", "CKMB", "CKMBINDEX", "TROPONINI" in the last 168 hours. BNP (last 3 results) No results for input(s): "PROBNP" in the last 8760 hours. HbA1C: No results for input(s): "HGBA1C" in the last 72 hours. CBG: Recent Labs  Lab 07/26/23 1141 07/26/23 1846 07/26/23 2052 07/27/23 1410 07/30/23 0726  GLUCAP 145* 122* 131* 158* 104*   Lipid Profile: No results for input(s): "CHOL", "HDL", "LDLCALC", "TRIG", "CHOLHDL", "LDLDIRECT" in the last 72 hours. Thyroid Function Tests: No results for input(s): "TSH", "T4TOTAL", "FREET4",  "T3FREE", "THYROIDAB" in the last 72 hours. Anemia Panel: No results for input(s): "VITAMINB12", "FOLATE", "FERRITIN", "TIBC", "IRON", "RETICCTPCT" in the last 72 hours. Sepsis Labs: No results for input(s): "PROCALCITON", "LATICACIDVEN" in the last 168 hours.  Recent Results (from the past 240 hours)  SARS Coronavirus 2 by RT PCR (hospital order, performed in [facility] hospital lab) *cepheid single result test* Anterior Nasal Swab     Status: None   Collection Time: 07/25/23  1:30 AM   Specimen: Anterior Nasal Swab  Result Value Ref Range Status   SARS Coronavirus 2 by RT PCR NEGATIVE NEGATIVE Final    Comment: Performed at [facility], [address] [place], [place] [address]         Radiology Studies: DG Chest 2 View Result Date: 07/29/2023 CLINICAL DATA:  Weakness skip EXAM: CHEST - 2 VIEW COMPARISON:  Chest x-ray 12/12/2021 FINDINGS: The heart size and mediastinal contours are within normal limits. Both lungs are clear. The visualized skeletal structures are unremarkable. IMPRESSION: No active cardiopulmonary disease. Electronically Signed   By: Darliss Cheney M.D.   On: 07/29/2023 20:34        Scheduled Meds:  ascorbic acid  500 mg Oral BID   atorvastatin  20 mg Oral Daily   calcium acetate  1,334 mg Oral TID WC   fluticasone  2 spray Each Nare Daily   folic acid  1 mg Oral Daily   furosemide  80 mg Oral Daily   gabapentin  300 mg Oral TID   hydrochlorothiazide  12.5 mg Oral Daily   insulin aspart  0-15 Units Subcutaneous TID WC   insulin aspart  0-5 Units Subcutaneous QHS   irbesartan  75 mg Oral Daily   levothyroxine  125 mcg Oral Q0600   loratadine  10 mg Oral Daily   [START ON 08/01/2023] midodrine  2.5 mg Oral Once per day on Monday Wednesday Friday   multivitamin  1 tablet Oral QHS   pantoprazole  40 mg Oral Daily   propranolol  20 mg Oral TID   senna-docusate  2 tablet Oral QHS   valACYclovir  1,000 mg Oral TID   zinc sulfate (50mg  elemental  zinc)  220 mg Oral Daily   Continuous Infusions:   LOS: 0 days    Charise Killian, MD [facility] Pager [phone]  If 7PM-7AM, please contact night-coverage [email or web address] 07/30/2023, 9:08 AM

## 2023-07-30 NOTE — Assessment & Plan Note (Signed)
 Continue Neurontin

## 2023-07-30 NOTE — Care Management Obs Status (Signed)
 MEDICARE OBSERVATION STATUS NOTIFICATION   Patient Details  Name: Baljit Liebert MRN: [id number] Date of Birth: 05/17/1965   Medicare Observation Status Notification Given:  Yes    Kizzie Ide Aunesti Pellegrino, RN 07/30/2023, 3:42 PM

## 2023-07-30 NOTE — Progress Notes (Signed)
 Referring Provider: No ref. provider found Primary Care Physician:  [name], MD Primary Nephrologist:  Dr.   Jaquita Rector for Consultation: ESRD  HPI: 58 year old female with history of hypertension, hypothyroidism, diabetes, asthma, osteoarthritis and end-stage renal disease on dialysis on a Monday Wednesday Friday schedule.  She had a dialysis on Friday at her outpatient dialysis center.  After she went home she developed generalized weakness, presyncopal episode associated with nausea and some dyspnea.  She denied any chest pain, fever, chills or palpitations.  She was given IV fluids last night.  Presently feels much better.  Past Medical History:  Diagnosis Date   Acute hemorrhoid 03/11/2015   Anxiety    Arthritis    Asthma    Cancer (HCC)    Chronic back pain    CLL (chronic lymphocytic leukemia) (HCC)    Depression    Diabetes mellitus without complication (HCC)    Genital herpes    type 2   Hypertension    Hypothyroidism    Vertigo     Past Surgical History:  Procedure Laterality Date   ABDOMINAL HYSTERECTOMY     BILATERAL SALPINGOOPHORECTOMY  2009   BREAST BIOPSY Right 05/17/2016   FIBROADENOMATOUS CHANGE AND SCLEROSING ADENOSIS WITH   COLONOSCOPY WITH PROPOFOL N/A 06/16/2015   Procedure: COLONOSCOPY WITH PROPOFOL;  Surgeon: Elnita Maxwell, MD;  Location: [facility] ENDOSCOPY;  Service: Endoscopy;  Laterality: N/A;   DIALYSIS/PERMA CATHETER INSERTION N/A 12/17/2020   Procedure: DIALYSIS/PERMA CATHETER INSERTION;  Surgeon: Annice Needy, MD;  Location: [facility] INVASIVE CV LAB;  Service: Cardiovascular;  Laterality: N/A;   LAPAROSCOPIC SUPRACERVICAL HYSTERECTOMY  2009   due to leio   OOPHORECTOMY     TEMPORARY DIALYSIS CATHETER N/A 12/10/2020   Procedure: TEMPORARY DIALYSIS CATHETER;  Surgeon: Renford Dills, MD;  Location: [facility] INVASIVE CV LAB;  Service: Cardiovascular;  Laterality: N/A;   THORACIC LAMINECTOMY FOR EPIDURAL ABSCESS Bilateral 06/17/2020   Procedure:  THORACIC LAMINECTOMY FOR EPIDURAL ABSCESS;  Surgeon: Lucy Chris, MD;  Location: [facility] ORS;  Service: Neurosurgery;  Laterality: Bilateral;    Prior to Admission medications   Medication Sig Start Date End Date Taking? Authorizing Provider  acetaminophen (TYLENOL) 325 MG tablet Take 2 tablets (650 mg total) by mouth every 6 (six) hours as needed for mild pain (or Fever >/= 101). 07/30/20  Yes Angiulli, Mcarthur Rossetti, PA-C  ascorbic acid (VITAMIN C) 500 MG tablet Take 1 tablet (500 mg total) by mouth 2 (two) times daily. 12/22/21  Yes Sreenath, Sudheer B, MD  aspirin EC 81 MG tablet Take 1 tablet (81 mg total) by mouth daily. Swallow whole. 10/02/20  Yes Delfino Lovett, MD  atorvastatin (LIPITOR) 20 MG tablet Take 20 mg by mouth daily. 10/18/21  Yes [provider]  calcium acetate (PHOSLO) 667 MG capsule Take 1,334 mg by mouth 3 (three) times daily with meals. 05/11/23 05/10/24 Yes [provider]  cetirizine (ZYRTEC) 10 MG tablet Take 1 tablet by mouth daily. 07/07/23  Yes [provider]  fluticasone (FLONASE) 50 MCG/ACT nasal spray Place 2 sprays into both nostrils daily.   Yes [provider]  gabapentin (NEURONTIN) 300 MG capsule TAKE 1 CAPSULE BY MOUTH THREE TIMES A DAY 07/07/23  Yes Felecia Shelling, DPM  hydrOXYzine (ATARAX) 25 MG tablet Take 1 tablet (25 mg total) by mouth 3 (three) times daily as needed. 07/27/23  Yes Marcelino Duster, MD  lanthanum (FOSRENOL) 1000 MG chewable tablet Chew 1,000 mg by mouth 3 (three) times daily.  Yes [provider]  levothyroxine (SYNTHROID) 125 MCG tablet Take 125 mcg by mouth daily. 12/08/21  Yes [provider]  LOKELMA 10 g PACK packet Take 10 g by mouth once a week.   Yes [provider]  midodrine (PROAMATINE) 2.5 MG tablet Take 2.5 mg by mouth 3 (three) times a week. Take one tablet 15-30 minutes before dialysis 06/14/23  Yes [provider]  multivitamin (RENA-VIT) TABS tablet Take 1 tablet by  mouth at bedtime. 12/22/21  Yes Sreenath, Sudheer B, MD  ondansetron (ZOFRAN-ODT) 4 MG disintegrating tablet Take 1 tablet (4 mg total) by mouth every 8 (eight) hours as needed for nausea or vomiting. 07/27/23  Yes Marcelino Duster, MD  pantoprazole (PROTONIX) 40 MG tablet Take 1 tablet (40 mg total) by mouth daily. 07/27/23  Yes Marcelino Duster, MD  propranolol (INDERAL) 20 MG tablet Take 20 mg by mouth 3 (three) times daily. 03/17/23 03/16/24 Yes [provider]  valACYclovir (VALTREX) 1000 MG tablet Take 1 tablet (1,000 mg total) by mouth 3 (three) times daily. 07/25/23  Yes Irean Hong, MD  zinc sulfate 220 (50 Zn) MG capsule Take 1 capsule (220 mg total) by mouth daily. 12/23/21  Yes Sreenath, Sudheer B, MD  albuterol (VENTOLIN HFA) 108 (90 Base) MCG/ACT inhaler Inhale 2 puffs into the lungs every 6 (six) hours as needed for wheezing or shortness of breath. 10/18/21   [provider]  amitriptyline (ELAVIL) 10 MG tablet Take 10-20 mg by mouth at bedtime as needed (neuropathy pain).    [provider]  Continuous Blood Gluc Sensor (DEXCOM G6 SENSOR) MISC SMARTSIG:1 Each Topical Every 10 Days 08/12/20   [provider]  folic acid (FOLVITE) 1 MG tablet Take 1 tablet by mouth daily. 10/18/21   [provider]  furosemide (LASIX) 80 MG tablet Take 1 tablet by mouth daily. 07/20/23 08/23/24  [provider]  hydrochlorothiazide (HYDRODIURIL) 12.5 MG tablet Take 1 tablet by mouth daily. 03/17/23   [provider]  senna-docusate (SENOKOT-S) 8.6-50 MG tablet Take 2 tablets by mouth at bedtime. Patient not taking: Reported on 07/30/2023 12/22/21   Tresa Moore, MD  telmisartan (MICARDIS) 80 MG tablet Take 80 mg by mouth daily. 03/10/23 03/09/24  [provider]  budesonide-formoterol (SYMBICORT) 160-4.5 MCG/ACT inhaler Inhale into the lungs. 07/14/18 03/15/19  [provider]    Current Facility-Administered  Medications  Medication Dose Route Frequency Provider Last Rate Last Admin   acetaminophen (TYLENOL) tablet 650 mg  650 mg Oral Q6H PRN Mansy, Jan A, MD       Or   acetaminophen (TYLENOL) suppository 650 mg  650 mg Rectal Q6H PRN Mansy, Jan A, MD       albuterol (PROVENTIL) (2.5 MG/3ML) 0.083% nebulizer solution 2.5 mg  2.5 mg Nebulization Q6H PRN Otelia Sergeant, RPH       ascorbic acid (VITAMIN C) tablet 500 mg  500 mg Oral BID Mansy, Jan A, MD   500 mg at 07/30/23 0959   atorvastatin (LIPITOR) tablet 20 mg  20 mg Oral Daily Mansy, Jan A, MD   20 mg at 07/30/23 1000   calcium acetate (PHOSLO) capsule 1,334 mg  1,334 mg Oral TID WC Mansy, Jan A, MD   1,334 mg at 07/30/23 1000   fluticasone (FLONASE) 50 MCG/ACT nasal spray 2 spray  2 spray Each Nare Daily Mansy, Jan A, MD       folic acid (FOLVITE) tablet 1 mg  1 mg  Oral Daily Mansy, Jan A, MD   1 mg at 07/30/23 1610   furosemide (LASIX) tablet 80 mg  80 mg Oral Daily Mansy, Jan A, MD       gabapentin (NEURONTIN) capsule 300 mg  300 mg Oral TID Mansy, Jan A, MD   300 mg at 07/30/23 0959   hydrochlorothiazide (HYDRODIURIL) tablet 12.5 mg  12.5 mg Oral Daily Mansy, Jan A, MD   12.5 mg at 07/30/23 1000   hydrOXYzine (ATARAX) tablet 25 mg  25 mg Oral TID PRN Mansy, Jan A, MD       insulin aspart (novoLOG) injection 0-15 Units  0-15 Units Subcutaneous TID WC Mansy, Jan A, MD       insulin aspart (novoLOG) injection 0-5 Units  0-5 Units Subcutaneous QHS Mansy, Jan A, MD       irbesartan (AVAPRO) tablet 75 mg  75 mg Oral Daily Mansy, Jan A, MD   75 mg at 07/30/23 9604   levothyroxine (SYNTHROID) tablet 125 mcg  125 mcg Oral Q0600 Mansy, Jan A, MD   125 mcg at 07/30/23 0644   loratadine (CLARITIN) tablet 10 mg  10 mg Oral Daily Mansy, Jan A, MD   10 mg at 07/30/23 1000   magnesium hydroxide (MILK OF MAGNESIA) suspension 30 mL  30 mL Oral Daily PRN Mansy, Jan A, MD       [START ON 08/01/2023] midodrine (PROAMATINE) tablet 5 mg  5 mg Oral Once per day on  Monday Wednesday Friday Lorain Childes, MD       morphine (PF) 2 MG/ML injection 1 mg  1 mg Intravenous Q4H PRN Charise Killian, MD   1 mg at 07/30/23 5409   multivitamin (RENA-VIT) tablet 1 tablet  1 tablet Oral QHS Mansy, Jan A, MD       ondansetron [facility]) tablet 4 mg  4 mg Oral Q6H PRN Mansy, Jan A, MD       Or   ondansetron [facility]) injection 4 mg  4 mg Intravenous Q6H PRN Mansy, Jan A, MD   4 mg at 07/30/23 8119   oxyCODONE-acetaminophen (PERCOCET/ROXICET) 5-325 MG per tablet 1 tablet  1 tablet Oral Q6H PRN Charise Killian, MD       pantoprazole (PROTONIX) EC tablet 40 mg  40 mg Oral Daily Mansy, Jan A, MD   40 mg at 07/30/23 1000   propranolol (INDERAL) tablet 20 mg  20 mg Oral TID Mansy, Jan A, MD   20 mg at 07/30/23 1000   senna-docusate (Senokot-S) tablet 2 tablet  2 tablet Oral QHS Mansy, Jan A, MD       traZODone (DESYREL) tablet 25 mg  25 mg Oral QHS PRN Mansy, Jan A, MD       valACYclovir (VALTREX) tablet 1,000 mg  1,000 mg Oral TID Mansy, Jan A, MD   1,000 mg at 07/30/23 1000   zinc sulfate (50mg  elemental zinc) capsule 220 mg  220 mg Oral Daily Mansy, Jan A, MD   220 mg at 07/30/23 1478   Current Outpatient Medications  Medication Sig Dispense Refill   acetaminophen (TYLENOL) 325 MG tablet Take 2 tablets (650 mg total) by mouth every 6 (six) hours as needed for mild pain (or Fever >/= 101).     ascorbic acid (VITAMIN C) 500 MG tablet Take 1 tablet (500 mg total) by mouth 2 (two) times daily.     aspirin EC 81 MG tablet Take 1 tablet (81 mg total) by mouth daily. Swallow  whole. 30 tablet 11   atorvastatin (LIPITOR) 20 MG tablet Take 20 mg by mouth daily.     calcium acetate (PHOSLO) 667 MG capsule Take 1,334 mg by mouth 3 (three) times daily with meals.     cetirizine (ZYRTEC) 10 MG tablet Take 1 tablet by mouth daily.     fluticasone (FLONASE) 50 MCG/ACT nasal spray Place 2 sprays into both nostrils daily.     gabapentin (NEURONTIN) 300 MG capsule TAKE 1 CAPSULE BY  MOUTH THREE TIMES A DAY 90 capsule 3   hydrOXYzine (ATARAX) 25 MG tablet Take 1 tablet (25 mg total) by mouth 3 (three) times daily as needed. 30 tablet 0   lanthanum (FOSRENOL) 1000 MG chewable tablet Chew 1,000 mg by mouth 3 (three) times daily.     levothyroxine (SYNTHROID) 125 MCG tablet Take 125 mcg by mouth daily.     LOKELMA 10 g PACK packet Take 10 g by mouth once a week.     midodrine (PROAMATINE) 2.5 MG tablet Take 2.5 mg by mouth 3 (three) times a week. Take one tablet 15-30 minutes before dialysis     multivitamin (RENA-VIT) TABS tablet Take 1 tablet by mouth at bedtime.  0   ondansetron (ZOFRAN-ODT) 4 MG disintegrating tablet Take 1 tablet (4 mg total) by mouth every 8 (eight) hours as needed for nausea or vomiting. 20 tablet 0   pantoprazole (PROTONIX) 40 MG tablet Take 1 tablet (40 mg total) by mouth daily. 30 tablet 1   propranolol (INDERAL) 20 MG tablet Take 20 mg by mouth 3 (three) times daily.     valACYclovir (VALTREX) 1000 MG tablet Take 1 tablet (1,000 mg total) by mouth 3 (three) times daily. 21 tablet 0   zinc sulfate 220 (50 Zn) MG capsule Take 1 capsule (220 mg total) by mouth daily.     albuterol (VENTOLIN HFA) 108 (90 Base) MCG/ACT inhaler Inhale 2 puffs into the lungs every 6 (six) hours as needed for wheezing or shortness of breath.     amitriptyline (ELAVIL) 10 MG tablet Take 10-20 mg by mouth at bedtime as needed (neuropathy pain).     Continuous Blood Gluc Sensor (DEXCOM G6 SENSOR) MISC SMARTSIG:1 Each Topical Every 10 Days     folic acid (FOLVITE) 1 MG tablet Take 1 tablet by mouth daily.     furosemide (LASIX) 80 MG tablet Take 1 tablet by mouth daily.     hydrochlorothiazide (HYDRODIURIL) 12.5 MG tablet Take 1 tablet by mouth daily.     senna-docusate (SENOKOT-S) 8.6-50 MG tablet Take 2 tablets by mouth at bedtime. (Patient not taking: Reported on 07/30/2023)     telmisartan (MICARDIS) 80 MG tablet Take 80 mg by mouth daily.      Allergies as of 07/29/2023 -  Review Complete 07/29/2023  Allergen Reaction Noted   Amoxapine  11/04/2020   Latex  11/22/2022   Ceftazidime  04/27/2021   Iodinated contrast media Hives 11/22/2022   Penicillins  11/04/2020    Family History  Problem Relation Age of Onset   Cancer Paternal Aunt    Breast cancer Maternal Aunt 17    Social History   Socioeconomic History   Marital status: Married    Spouse name: Not on file   Number of children: Not on file   Years of education: Not on file   Highest education level: Not on file  Occupational History   Not on file  Tobacco Use   Smoking status: Never   Smokeless tobacco:  Never  Vaping Use   Vaping status: Never Used  Substance and Sexual Activity   Alcohol use: Not Currently    Alcohol/week: 1.0 standard drink of alcohol    Types: 1 Cans of beer per week   Drug use: No   Sexual activity: Not Currently    Birth control/protection: None  Other Topics Concern   Not on file  Social History Narrative   Not on file   Social Drivers of Health   Financial Resource Strain: High Risk (07/06/2023)   Received from [facility]   Overall Financial Resource Strain (CARDIA)    Difficulty of Paying Living Expenses: Hard  Food Insecurity: No Food Insecurity (07/30/2023)   Hunger Vital Sign    Worried About Running Out of Food in the Last Year: Never true    Ran Out of Food in the Last Year: Never true  Recent Concern: Food Insecurity - Food Insecurity Present (07/06/2023)   Received from [facility]   Hunger Vital Sign    Worried About Running Out of Food in the Last Year: Sometimes true    Ran Out of Food in the Last Year: Sometimes true  Transportation Needs: No Transportation Needs (07/30/2023)   PRAPARE - Administrator, Civil Service (Medical): No    Lack of Transportation (Non-Medical): No  Recent Concern: Transportation Needs - Unmet Transportation Needs (07/06/2023)   Received from [facility] - Transportation    In the past 12 months, has lack of transportation kept you from medical appointments or from getting medications?: Yes    Lack of Transportation (Non-Medical): Yes  Physical Activity: Not on file  Stress: Not on file  Social Connections: Not on file  Intimate Partner Violence: Not At Risk (07/30/2023)   Humiliation, Afraid, Rape, and Kick questionnaire    Fear of Current or Ex-Partner: No    Emotionally Abused: No    Physically Abused: No    Sexually Abused: No    Physical Exam: Vital signs in last 24 hours: Temp:  [98.1 F (36.7 C)-98.9 F (37.2 C)] 98.1 F (36.7 C) (04/05 0939) Pulse Rate:  [87-98] 87 (04/05 0939) Resp:  [15-18] 17 (04/05 0939) BP: (96-134)/(61-88) 120/88 (04/05 0939) SpO2:  [95 %-100 %] 100 % (04/05 0939) Weight:  [87.8 kg] 87.8 kg (04/04 1842)   General:   Alert,  Well-developed, well-nourished, pleasant and cooperative in NAD Head:  Normocephalic and atraumatic. Eyes:  Sclera clear, no icterus.   Conjunctiva pink. Ears:  Normal auditory acuity. Nose:  No deformity, discharge,  or lesions. Lungs:  Clear throughout to auscultation.   No wheezes, crackles, or rhonchi. No acute distress. Heart:  Regular rate and rhythm; no murmurs, clicks, rubs,  or gallops. Abdomen:  Soft, nontender and nondistended. No masses, hepatosplenomegaly or hernias noted. Normal bowel sounds, without guarding, and without rebound.   Extremities:  Without clubbing or edema.  Intake/Output from previous day: No intake/output data recorded. Intake/Output this shift: No intake/output data recorded.  Lab Results: Recent Labs    07/29/23 1930 07/30/23 0716  WBC 2.6* 2.1*  HGB 13.0 12.6  HCT 41.4 39.7  PLT 21* 31*   BMET Recent Labs    07/29/23 1930 07/30/23 0716  NA 134* 136  K 3.9 3.6  CL 94* 97*  CO2 21* 24  GLUCOSE 88 93  BUN 25* 33*  CREATININE 4.98* 6.12*  CALCIUM 9.8 9.2   LFT Recent Labs  07/29/23 1930  PROT 7.9   ALBUMIN 4.0  AST 36  ALT 21  ALKPHOS 63  BILITOT 1.4*   PT/INR No results for input(s): "LABPROT", "INR" in the last 72 hours. Hepatitis Panel No results for input(s): "HEPBSAG", "HCVAB", "HEPAIGM", "HEPBIGM" in the last 72 hours.  Studies/Results: DG Chest 2 View Result Date: 07/29/2023 CLINICAL DATA:  Weakness skip EXAM: CHEST - 2 VIEW COMPARISON:  Chest x-ray 12/12/2021 FINDINGS: The heart size and mediastinal contours are within normal limits. Both lungs are clear. The visualized skeletal structures are unremarkable. IMPRESSION: No active cardiopulmonary disease. Electronically Signed   By: Darliss Cheney M.D.   On: 07/29/2023 20:34    Assessment/Plan:  58 year old female with history of hypertension, hypothyroidism, diabetes, asthma, osteoarthritis and end-stage renal disease on dialysis on a Monday Wednesday Friday schedule.  She had a dialysis on Friday at her outpatient dialysis center.  After she went home she developed generalized weakness, presyncopal episode associated with nausea and some dyspnea.  She denied any chest pain, fever, chills or palpitations.  She was given IV fluids last night.  Presently feels much better.  ESRD: Her next dialysis is scheduled for Monday.  Will start her on midodrine 5 mg predialysis on Monday Wednesday Friday.  ANEMIA: Hemoglobin is stable.  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  Continue lanthanum.  HTN/VOL: Continue ARB and is advised to stay on 2 g salt restricted diet.  Spoke to the patient at bedside in detail and answered all her questions to her satisfaction. Labs and medications reviewed. Will continue to monitor closely.    LOS: 0 Lorain Childes, MD [facility] @TODAY @10 :54 AM

## 2023-07-30 NOTE — ED Notes (Signed)
 Received orders from Dr.Jansy to hold heparin injection due to low platelet level.

## 2023-07-30 NOTE — Assessment & Plan Note (Signed)
-   This is associated with dyspnea and elevated troponin I.  Will need to rule out ACS. - The patient will be admitted to an observation cardiac monitor bed. - Will follow serial troponins. - Will check orthostatics. - Will monitor for arrhythmias.

## 2023-07-30 NOTE — ED Notes (Signed)
 Ambulated with patient. Patient had unsteady gait and needed to hold on to me to walk.

## 2023-07-30 NOTE — H&P (Signed)
 [place]   PATIENT NAME: Tonya Myers    MR#:  [id number]  DATE OF BIRTH:  [birth year]  DATE OF ADMISSION:  07/29/2023  PRIMARY CARE PHYSICIAN: [name], MD   Patient is coming from: Home  REQUESTING/REFERRING PHYSICIAN: Ward, Layla Maw, DO  CHIEF COMPLAINT:  Weakness  HISTORY OF PRESENT ILLNESS:  Tonya Myers is a 58 y.o. African-American female with medical history significant for end-stage renal disease on hemodialysis, osteoarthritis, asthma, anxiety, hypertension and hypothyroidism as well as type 2 diabetes mellitus, who presented to the emergency room with acute onset of generalized weakness and presyncope with associated dyspnea and nausea without vomiting.  She denied any chest pain or palpitations.  She had diarrhea last week and was diagnosed with pancreatitis and her symptoms resolved.  No fever or chills.  She has been having dysuria without hematuria or urgency or frequency or flank pain.  She did not miss any sessions of hemodialysis.  ED Course: When she came to the ER, vital signs were within normal.  Labs revealed hyponatremia 134 and hypochloremia 94 with CO2 of 21, BUN of 25 and creatinine 4.98 and 9 19, total bili 1.4 and serum lipase of 73.  High-sensitivity troponin I was 40 and later 75.  CBC showed mild leukopenia of 2.6 and thrombocytopenia of 21. EKG as reviewed by me : EKG showed mild sinus tachycardia with rate 101 with left anterior fascicular block. Imaging: 2 view chest x-ray showed no acute cardiopulmonary disease.  The patient was given 4 g aspirin and 500 mL IV lactated ringer.  She will be admitted to a cardiac telemetry observation bed for further evaluation and management. PAST MEDICAL HISTORY:   Past Medical History:  Diagnosis Date   Acute hemorrhoid 03/11/2015   Anxiety    Arthritis    Asthma    Cancer (HCC)    Chronic back pain    CLL (chronic lymphocytic leukemia) (HCC)    Depression    Diabetes mellitus  without complication (HCC)    Genital herpes    type 2   Hypertension    Hypothyroidism    Vertigo     PAST SURGICAL HISTORY:   Past Surgical History:  Procedure Laterality Date   ABDOMINAL HYSTERECTOMY     BILATERAL SALPINGOOPHORECTOMY  2009   BREAST BIOPSY Right 05/17/2016   FIBROADENOMATOUS CHANGE AND SCLEROSING ADENOSIS WITH   COLONOSCOPY WITH PROPOFOL N/A 06/16/2015   Procedure: COLONOSCOPY WITH PROPOFOL;  Surgeon: Elnita Maxwell, MD;  Location: [facility] ENDOSCOPY;  Service: Endoscopy;  Laterality: N/A;   DIALYSIS/PERMA CATHETER INSERTION N/A 12/17/2020   Procedure: DIALYSIS/PERMA CATHETER INSERTION;  Surgeon: Annice Needy, MD;  Location: [facility] INVASIVE CV LAB;  Service: Cardiovascular;  Laterality: N/A;   LAPAROSCOPIC SUPRACERVICAL HYSTERECTOMY  2009   due to leio   OOPHORECTOMY     TEMPORARY DIALYSIS CATHETER N/A 12/10/2020   Procedure: TEMPORARY DIALYSIS CATHETER;  Surgeon: Renford Dills, MD;  Location: [facility] INVASIVE CV LAB;  Service: Cardiovascular;  Laterality: N/A;   THORACIC LAMINECTOMY FOR EPIDURAL ABSCESS Bilateral 06/17/2020   Procedure: THORACIC LAMINECTOMY FOR EPIDURAL ABSCESS;  Surgeon: Lucy Chris, MD;  Location: [facility] ORS;  Service: Neurosurgery;  Laterality: Bilateral;    SOCIAL HISTORY:   Social History   Tobacco Use   Smoking status: Never   Smokeless tobacco: Never  Substance Use Topics   Alcohol use: Not Currently    Alcohol/week: 1.0 standard drink of alcohol    Types: 1 Cans of  beer per week    FAMILY HISTORY:   Family History  Problem Relation Age of Onset   Cancer Paternal Aunt    Breast cancer Maternal Aunt 70    DRUG ALLERGIES:   Allergies  Allergen Reactions   Amoxapine     Other Reaction(s): Unknown   Latex    Ceftazidime     Other reaction(s): Confusion, Hallucination, Unknown Encephalopathy - improved after dialysis/some concern for cephalosporin neurotoxicity Tolerated without confusion 06/2021. Encephalopathy - improved  after dialysis/some concern for cephalosporin neurotoxicity Tolerated without confusion 06/2021.    Iodinated Contrast Media Hives    Other Reaction(s): Hives   Penicillins     Other Reaction(s): Unknown    REVIEW OF SYSTEMS:   ROS As per history of present illness. All pertinent systems were reviewed above. Constitutional, HEENT, cardiovascular, respiratory, GI, GU, musculoskeletal, neuro, psychiatric, endocrine, integumentary and hematologic systems were reviewed and are otherwise negative/unremarkable except for positive findings mentioned above in the HPI.   MEDICATIONS AT HOME:   Prior to Admission medications   Medication Sig Start Date End Date Taking? Authorizing Provider  acetaminophen (TYLENOL) 325 MG tablet Take 2 tablets (650 mg total) by mouth every 6 (six) hours as needed for mild pain (or Fever >/= 101). 07/30/20   Angiulli, Mcarthur Rossetti, PA-C  albuterol (VENTOLIN HFA) 108 (90 Base) MCG/ACT inhaler Inhale 2 puffs into the lungs every 6 (six) hours as needed for wheezing or shortness of breath. 10/18/21   [provider]  amitriptyline (ELAVIL) 10 MG tablet Take 10-20 mg by mouth at bedtime as needed (neuropathy pain).    [provider]  ascorbic acid (VITAMIN C) 500 MG tablet Take 1 tablet (500 mg total) by mouth 2 (two) times daily. 12/22/21   Tresa Moore, MD  aspirin EC 81 MG tablet Take 1 tablet (81 mg total) by mouth daily. Swallow whole. 10/02/20   Delfino Lovett, MD  atorvastatin (LIPITOR) 20 MG tablet Take 20 mg by mouth daily. 10/18/21   [provider]  calcium acetate (PHOSLO) 667 MG capsule Take 1,334 mg by mouth 3 (three) times daily with meals. 05/11/23 05/10/24  [provider]  cetirizine (ZYRTEC) 10 MG tablet Take 1 tablet by mouth daily. 07/07/23   [provider]  Continuous Blood Gluc Sensor (DEXCOM G6 SENSOR) MISC SMARTSIG:1 Each Topical Every 10 Days 08/12/20   [provider]  fluticasone (FLONASE) 50 MCG/ACT  nasal spray Place 2 sprays into both nostrils daily.    [provider]  folic acid (FOLVITE) 1 MG tablet Take 1 tablet by mouth daily. 10/18/21   [provider]  furosemide (LASIX) 80 MG tablet Take 1 tablet by mouth daily. 07/20/23 08/23/24  [provider]  gabapentin (NEURONTIN) 300 MG capsule TAKE 1 CAPSULE BY MOUTH THREE TIMES A DAY 07/07/23   Felecia Shelling, DPM  hydrochlorothiazide (HYDRODIURIL) 12.5 MG tablet Take 1 tablet by mouth daily. 03/17/23   [provider]  hydrOXYzine (ATARAX) 25 MG tablet Take 1 tablet (25 mg total) by mouth 3 (three) times daily as needed. 07/27/23   Marcelino Duster, MD  lanthanum (FOSRENOL) 1000 MG chewable tablet Chew 1,000 mg by mouth 3 (three) times daily. Patient not taking: Reported on 07/25/2023    [provider]  levothyroxine (SYNTHROID) 125 MCG tablet Take 125 mcg by mouth daily. 12/08/21   [provider]  LOKELMA 10 g PACK packet Take 10 g by mouth once a week.    [provider]  midodrine (PROAMATINE) 2.5 MG tablet Take 2.5 mg by mouth 3 (three) times a week. Take one tablet 15-30 minutes before dialysis 06/14/23   [provider]  multivitamin (RENA-VIT) TABS tablet Take 1 tablet by mouth at bedtime. 12/22/21   Sreenath, Jonelle Sports, MD  ondansetron (ZOFRAN-ODT) 4 MG disintegrating tablet Take 1 tablet (4 mg total) by mouth every 8 (eight) hours as needed for nausea or vomiting. 07/27/23   Marcelino Duster, MD  pantoprazole (PROTONIX) 40 MG tablet Take 1 tablet (40 mg total) by mouth daily. 07/27/23   Marcelino Duster, MD  propranolol (INDERAL) 20 MG tablet Take 20 mg by mouth 3 (three) times daily. 03/17/23 03/16/24  [provider]  senna-docusate (SENOKOT-S) 8.6-50 MG tablet Take 2 tablets by mouth at bedtime. 12/22/21   Tresa Moore, MD  telmisartan (MICARDIS) 80 MG tablet Take 80 mg by mouth daily. 03/10/23 03/09/24  [provider]  valACYclovir  (VALTREX) 1000 MG tablet Take 1 tablet (1,000 mg total) by mouth 3 (three) times daily. 07/25/23   Irean Hong, MD  zinc sulfate 220 (50 Zn) MG capsule Take 1 capsule (220 mg total) by mouth daily. 12/23/21   Tresa Moore, MD  budesonide-formoterol (SYMBICORT) 160-4.5 MCG/ACT inhaler Inhale into the lungs. 07/14/18 03/15/19  [provider]      VITAL SIGNS:  Blood pressure 96/72, pulse 92, temperature 98.8 F (37.1 C), temperature source Oral, resp. rate 16, weight 87.8 kg, SpO2 100%.  PHYSICAL EXAMINATION:  Physical Exam  GENERAL:  58 y.o.-year-old African-American female patient lying in the bed with no acute distress.  EYES: Pupils equal, round, reactive to light and accommodation. No scleral icterus. Extraocular muscles intact.  HEENT: Head atraumatic, normocephalic. Oropharynx and nasopharynx clear.  NECK:  Supple, no jugular venous distention. No thyroid enlargement, no tenderness.  LUNGS: Normal breath sounds bilaterally, no wheezing, rales,rhonchi or crepitation. No use of accessory muscles of respiration.  CARDIOVASCULAR: Regular rate and rhythm, S1, S2 normal. No murmurs, rubs, or gallops.  ABDOMEN: Soft, nondistended, nontender. Bowel sounds present. No organomegaly or mass.  EXTREMITIES: No pedal edema, cyanosis, or clubbing.  NEUROLOGIC: Cranial nerves II through XII are intact. Muscle strength 5/5 in all extremities. Sensation intact. Gait not checked.  PSYCHIATRIC: The patient is alert and oriented x 3.  Normal affect and good eye contact. SKIN: No obvious rash, lesion, or ulcer.   LABORATORY PANEL:   CBC Recent Labs  Lab 07/29/23 1930  WBC 2.6*  HGB 13.0  HCT 41.4  PLT 21*   ------------------------------------------------------------------------------------------------------------------  Chemistries  Recent Labs  Lab 07/29/23 1930  NA 134*  K 3.9  CL 94*  CO2 21*  GLUCOSE 88  BUN 25*  CREATININE 4.98*  CALCIUM 9.8  AST 36  ALT 21   ALKPHOS 63  BILITOT 1.4*   ------------------------------------------------------------------------------------------------------------------  Cardiac Enzymes No results for input(s): "TROPONINI" in the last 168 hours. ------------------------------------------------------------------------------------------------------------------  RADIOLOGY:  DG Chest 2 View Result Date: 07/29/2023 CLINICAL DATA:  Weakness skip EXAM: CHEST - 2 VIEW COMPARISON:  Chest x-ray 12/12/2021 FINDINGS: The heart size and mediastinal contours are within normal limits. Both lungs are clear. The visualized skeletal structures are unremarkable. IMPRESSION: No active cardiopulmonary disease. Electronically Signed   By: Darliss Cheney M.D.   On: 07/29/2023 20:34      IMPRESSION AND PLAN:  Assessment and Plan: * Near syncope - This is associated with dyspnea and elevated troponin I.  Will need to rule out ACS. -  The patient will be admitted to an observation cardiac monitor bed. - Will follow serial troponins. - Will check orthostatics. - Will monitor for arrhythmias.  ESRD (end stage renal disease) [facility]) - Nephrology consult will be obtained to follow-up on hemodialysis.  Essential hypertension - We will continue antihypertensive therapy.  Dyslipidemia - We will continue statin therapy.  Peripheral neuropathy - Continue Neurontin.  GERD without esophagitis - We will continue PPI therapy.      DVT prophylaxis: SCDs.  Medical prophylaxis contraindicated due to thrombocytopenia. Advanced Care Planning:  Code Status: full code. Family Communication:  The plan of care was discussed in details with the patient (and family). I answered all questions. The patient agreed to proceed with the above mentioned plan. Further management will depend upon hospital course. Disposition Plan: Back to previous home environment Consults called: Nephrology. All the records are reviewed and case discussed with ED  provider.  Status is: Observation .  I certify that at the time of admission, it is my clinical judgment that the patient will require hospital care extending less than 2 midnights.                            Dispo: The patient is from: Home              Anticipated d/c is to: Home              Patient currently is not medically stable to d/c.              Difficult to place patient: No  Hannah Beat M.D on 07/30/2023 at 7:06 AM  [facility]   From 7 PM-7 AM, contact night-coverage [email or web address]  CC: Primary care physician; [name], MD

## 2023-07-30 NOTE — Assessment & Plan Note (Signed)
-   We will continue antihypertensive therapy.

## 2023-07-31 DIAGNOSIS — R55 Syncope and collapse: Secondary | ICD-10-CM | POA: Diagnosis not present

## 2023-07-31 LAB — GLUCOSE, CAPILLARY
Glucose-Capillary: 86 mg/dL (ref 70–99)
Glucose-Capillary: 100 mg/dL — ABNORMAL HIGH (ref 70–99)
Glucose-Capillary: 118 mg/dL — ABNORMAL HIGH (ref 70–99)
Glucose-Capillary: 138 mg/dL — ABNORMAL HIGH (ref 70–99)

## 2023-07-31 LAB — CBC
HCT: 38.7 % (ref 36.0–46.0)
Hemoglobin: 12 g/dL (ref 12.0–15.0)
MCH: 29.7 pg (ref 26.0–34.0)
MCHC: 31 g/dL (ref 30.0–36.0)
MCV: 95.8 fL (ref 80.0–100.0)
Platelets: 36 10*3/uL — ABNORMAL LOW (ref 150–400)
RBC: 4.04 MIL/uL (ref 3.87–5.11)
RDW: 21.2 % — ABNORMAL HIGH (ref 11.5–15.5)
WBC: 3 10*3/uL — ABNORMAL LOW (ref 4.0–10.5)
nRBC: 0 % (ref 0.0–0.2)

## 2023-07-31 LAB — BASIC METABOLIC PANEL WITH GFR
Anion gap: 17 — ABNORMAL HIGH (ref 5–15)
BUN: 62 mg/dL — ABNORMAL HIGH (ref 6–20)
CO2: 24 mmol/L (ref 22–32)
Calcium: 8.9 mg/dL (ref 8.9–10.3)
Chloride: 96 mmol/L — ABNORMAL LOW (ref 98–111)
Creatinine, Ser: 8.79 mg/dL — ABNORMAL HIGH (ref 0.44–1.00)
GFR, Estimated: 5 mL/min — ABNORMAL LOW (ref >60)
Glucose, Bld: 126 mg/dL — ABNORMAL HIGH (ref 70–99)
Potassium: 4.1 mmol/L (ref 3.5–5.1)
Sodium: 137 mmol/L (ref 135–145)

## 2023-07-31 LAB — HEPATITIS B SURFACE ANTIGEN: Hepatitis B Surface Ag: NONREACTIVE

## 2023-07-31 MED ORDER — GABAPENTIN 100 MG PO CAPS
100.0000 mg | ORAL_CAPSULE | Freq: Every day | ORAL | Status: DC
  Administered 2023-07-31: 100 mg via ORAL
  Filled 2023-07-31: qty 1

## 2023-07-31 MED ORDER — CHLORHEXIDINE GLUCONATE CLOTH 2 % EX PADS
6.0000 | MEDICATED_PAD | Freq: Every day | CUTANEOUS | Status: DC
  Administered 2023-07-31 – 2023-08-03 (×4): 6 via TOPICAL

## 2023-07-31 MED ORDER — CHLORHEXIDINE GLUCONATE CLOTH 2 % EX PADS: 6.0000 | MEDICATED_PAD | Freq: Every day | CUTANEOUS | Status: DC

## 2023-07-31 NOTE — Progress Notes (Signed)
 Mobility Specialist - Progress Note   07/31/23 1626  Mobility  Activity Ambulated with assistance in hallway;Stood at bedside;Dangled on edge of bed  Level of Assistance Standby assist, set-up cues, supervision of patient - no hands on  Assistive Device Front wheel walker  Distance Ambulated (ft) 500 ft  Activity Response Tolerated well  Mobility Referral Yes  Mobility visit 1 Mobility  Mobility Specialist Start Time (ACUTE ONLY) 1551  Mobility Specialist Stop Time (ACUTE ONLY) 1608  Mobility Specialist Time Calculation (min) (ACUTE ONLY) 17 min   Pt sitting EOB on RA upon arrival. Pt STS and ambulates in hallway SBA. Pt returns to bed with needs in reach.   Terrilyn Saver  Mobility Specialist  07/31/23 4:28 PM

## 2023-07-31 NOTE — Evaluation (Addendum)
 Occupational Therapy Evaluation Patient Details Name: Tonya Myers MRN: [id number] DOB: 1966-03-10 Today's Date: 07/31/2023   History of Present Illness   Pt is a 58 year old female presented to ED with near syncope associated dyspnea and nausea without vomiting.    PMH significant for end-stage renal disease on hemodialysis, osteoarthritis, asthma, anxiety, hypertension and hypothyroidism as well as type 2 diabetes mellitus     Clinical Impressions Chart reviewed to date, pt greeted semi supine in bed, alert and oriented x4, agreeable to OT evaluation. PTA pt reports generally MOD I-I with ADL/IADL,amb with no AD however use of a RW was recommended after admission and PT eval on 4/1 per chart. Pt endorses increased difficulties with med management recently. Pt presents with deficits in activity tolerance, balance, endurance affecting safe and optimal ADL completion. Bed mobility completed with supervision, STS with supervision CGA, amb with RW to bathroom and transfer to bsc with supervision-CGA. Grooming tasks completed in sitting with SET UP. Dizziness endorsed after initial STS, then resolves and no c/o for the rest of the session. Educated patient re: AE use, energy conservation techniques for increased ease of ADLs. Pt is left as received, all needs met. OT will continue to follow.      If plan is discharge home, recommend the following:   A little help with walking and/or transfers;A little help with bathing/dressing/bathroom;Direct supervision/assist for medications management     Functional Status Assessment   Patient has had a recent decline in their functional status and demonstrates the ability to make significant improvements in function in a reasonable and predictable amount of time.     Equipment Recommendations   BSC/3in1     Recommendations for Other Services         Precautions/Restrictions   Precautions Precautions: Fall Recall of  Precautions/Restrictions: Intact Restrictions Weight Bearing Restrictions Per Provider Order: No     Mobility Bed Mobility Overal bed mobility: Modified Independent                  Transfers Overall transfer level: Needs assistance   Transfers: Sit to/from Stand Sit to Stand: Contact guard assist                  Balance Overall balance assessment: Needs assistance Sitting-balance support: Feet supported Sitting balance-Leahy Scale: Good     Standing balance support: Bilateral upper extremity supported, During functional activity, Reliant on assistive device for balance Standing balance-Leahy Scale: Fair                             ADL either performed or assessed with clinical judgement   ADL Overall ADL's : Needs assistance/impaired Eating/Feeding: Set up;Sitting   Grooming: Supervision/safety;Sitting;Oral care Grooming Details (indicate cue type and reason): at sink level             Lower Body Dressing: Supervision/safety;Sitting/lateral leans   Toilet Transfer: CGA-Supervision/safety;Rolling walker (2 wheels);BSC/3in1;Cueing for sequencing Toilet Transfer Details (indicate cue type and reason): intermittent vcs for RW use Toileting- Clothing Manipulation and Hygiene: Supervision/safety;Sit to/from stand;Sitting/lateral lean Toileting - Clothing Manipulation Details (indicate cue type and reason): anticipate     Functional mobility during ADLs: Contact guard assist;Rolling walker (2 wheels);Cueing for sequencing (approx 15' two attempts)       Vision Patient Visual Report: No change from baseline       Perception         Praxis  Pertinent Vitals/Pain Pain Assessment Pain Assessment: No/denies pain     Extremity/Trunk Assessment Upper Extremity Assessment Upper Extremity Assessment: Overall WFL for tasks assessed   Lower Extremity Assessment Lower Extremity Assessment: Generalized weakness        Communication Communication Communication: No apparent difficulties   Cognition Arousal: Alert Behavior During Therapy: WFL for tasks assessed/performed Cognition: No apparent impairments                               Following commands: Intact       Cueing  General Comments   Cueing Techniques: Verbal cues  BP 113/78 after amb to/from bathroom   Exercises Other Exercises Other Exercises: edu re: role of OT, role of rehab, discharge recommendations, use of DME/AE for safe/ease of ADL completion   Shoulder Instructions      Home Living Family/patient expects to be discharged to:: Private residence Living Arrangements: Alone Available Help at Discharge: Family;Available PRN/intermittently Type of Home: House Home Access: Stairs to enter [employer] of Steps: 2 Entrance Stairs-Rails: None Home Layout: Two level;Able to live on main level with bedroom/bathroom Alternate Level Stairs-Number of Steps: flight to 2nd floor bedroom Alternate Level Stairs-Rails: Left Bathroom Shower/Tub: Walk-in shower   Bathroom Toilet: Handicapped height     Home Equipment: Grab bars - tub/shower;Cane - single point;Rollator (4 wheels)          Prior Functioning/Environment Prior Level of Function : Independent/Modified Independent;Driving             Mobility Comments: Modified independent ambulating with SPC. Reports 2 recent falls entering/leaving home on the stairs ADLs Comments: MOD I-I with ADL, reports shes having trouble with med management, cooks/cleans/drive    OT Problem List: Decreased activity tolerance;Impaired balance (sitting and/or standing);Decreased knowledge of use of DME or AE   OT Treatment/Interventions: Self-care/ADL training;Therapeutic exercise;Patient/family education;Therapeutic activities;DME and/or AE instruction      OT Goals(Current goals can be found in the care plan section)   Acute Rehab OT Goals Patient Stated  Goal: improve function OT Goal Formulation: With patient Time For Goal Achievement: 08/14/23 Potential to Achieve Goals: Good ADL Goals Pt Will Perform Lower Body Dressing: with modified independence;sitting/lateral leans;sit to/from stand Pt Will Transfer to Toilet: with modified independence;ambulating Pt Will Perform Toileting - Clothing Manipulation and hygiene: with modified independence;sitting/lateral leans;sit to/from stand Additional ADL Goal #1: Pt will improve IADL performance as evidenced by participating in pill box assessment to improve medication management   OT Frequency:  Min 2X/week    Co-evaluation              AM-PAC OT "6 Clicks" Daily Activity     Outcome Measure Help from another person eating meals?: None Help from another person taking care of personal grooming?: None Help from another person toileting, which includes using toliet, bedpan, or urinal?: None Help from another person bathing (including washing, rinsing, drying)?: A Little Help from another person to put on and taking off regular upper body clothing?: None Help from another person to put on and taking off regular lower body clothing?: None 6 Click Score: 23   End of Session Equipment Utilized During Treatment: Rolling walker (2 wheels)  Activity Tolerance: Patient tolerated treatment well Patient left: in bed;with call bell/phone within reach;with bed alarm set  OT Visit Diagnosis: Other abnormalities of gait and mobility (R26.89)  Time: 1610-9604 OT Time Calculation (min): 18 min Charges:  OT General Charges $OT Visit: 1 Visit OT Evaluation $OT Eval Low Complexity: 1 Low  Oleta Mouse, OTD OTR/L  07/31/23, 1:15 PM

## 2023-07-31 NOTE — Progress Notes (Signed)
 PROGRESS NOTE    Tonya Myers  ZOX:[id number] DOB: Oct 23, 1965 DOA: 07/29/2023 PCP: [name], MD  Assessment & Plan:   Principal Problem:   Near syncope Active Problems:   Essential hypertension   ESRD (end stage renal disease) (HCC)   Dyslipidemia   GERD without esophagitis   Peripheral neuropathy  Assessment and Plan: Near syncope: etiology unclear. Orthostatics are neg on 07/30/23 but positive 07/31/23 Continue on tele  Myocardial injury: w/ elevated troponins, likely secondary to demand ischemia.    ESRD: on HD. Nephro following and recs apprec    HTN: continue on home dose of irbesartan, hydrochlorothiazide. Hold for MAP < 65   HLD: continue on statin    Peripheral neuropathy: continue on gabapentin    GERD: continue on PPI   Thrombocytopenia: significant. Etiology unclear. Trending up today. Holding aspirin & heparin SQ. Hx of CLL. Suppose to getting a bone marrow biopsy w/ [facility] heme soon as per pt   Generalized weakness: PT/OT recs HH     DVT prophylaxis: SCDs only secondary to thrombocytopenia Code Status: full  Family Communication:  Disposition Plan: likely d/c back home w/ HH   Level of care: Telemetry Cardiac  Status is: Observation The patient remains OBS appropriate and will d/c before 2 midnights.    Consultants:  Nephro   Procedures:   Antimicrobials:   Subjective: Pt c/o malaise   Objective: Vitals:   07/31/23 0009 07/31/23 0012 07/31/23 0351 07/31/23 0742  BP: (!) 78/51 95/68 (!) 89/58 (!) 82/62  Pulse: 81 82 73 72  Resp: 18 16 18 16   Temp: 98.3 F (36.8 C) 98.4 F (36.9 C) 98.5 F (36.9 C) 98.5 F (36.9 C)  TempSrc:      SpO2: 99% 100% 97% 100%  Weight:      Height:        Intake/Output Summary (Last 24 hours) at 07/31/2023 0905 Last data filed at 07/30/2023 1950 Gross per 24 hour  Intake 180 ml  Output --  Net 180 ml   Filed Weights   07/29/23 1842 07/30/23 1622  Weight: 87.8 kg 90.6 kg     Examination:  General exam: Appears comfortable  Respiratory system: clear breath sounds b/l  Cardiovascular system: S1/S2+. No rubs or gallops  Gastrointestinal system: abd is soft, NT, ND & hypoactive bowel sounds  Central nervous system: alert & oriented. Moves all extremities  Psychiatry: Judgement and insight normal. Flat mood and affect    Data Reviewed: I have personally reviewed following labs and imaging studies  CBC: Recent Labs  Lab 07/25/23 0142 07/26/23 0422 07/27/23 0511 07/29/23 1930 07/30/23 0716  WBC 5.1 3.1* 2.8* 2.6* 2.1*  HGB 11.7* 11.2* 11.4* 13.0 12.6  HCT 38.3 35.3* 36.6 41.4 39.7  MCV 93.9 91.7 93.6 92.6 94.7  PLT 42* 35* 33* 21* 31*   Basic Metabolic Panel: Recent Labs  Lab 07/25/23 0142 07/26/23 0422 07/27/23 0811 07/29/23 1930 07/30/23 0716  NA 139 134* 134* 134* 136  K 5.5* 5.0 4.7 3.9 3.6  CL 99 95* 94* 94* 97*  CO2 21* 27 23 21* 24  GLUCOSE 133* 94 89 88 93  BUN 96* 45* 70* 25* 33*  CREATININE 9.72* 6.43* 9.49* 4.98* 6.12*  CALCIUM 9.8 8.6* 8.3* 9.8 9.2  PHOS  --   --  8.1*  --   --    GFR: Estimated Creatinine Clearance: 12.5 mL/min (A) (by C-G formula based on SCr of 6.12 mg/dL (H)). Liver Function Tests: Recent Labs  Lab 07/25/23 0142 07/26/23 0422 07/27/23 0811 07/29/23 1930  AST 24 23  --  36  ALT 21 16  --  21  ALKPHOS 58 58  --  63  BILITOT 0.8 0.9  --  1.4*  PROT 7.4 7.1  --  7.9  ALBUMIN 3.8 3.5 3.2* 4.0   Recent Labs  Lab 07/25/23 0142 07/29/23 1930  LIPASE 96* 73*   Recent Labs  Lab 07/25/23 0925  AMMONIA 28   Coagulation Profile: No results for input(s): "INR", "PROTIME" in the last 168 hours. Cardiac Enzymes: No results for input(s): "CKTOTAL", "CKMB", "CKMBINDEX", "TROPONINI" in the last 168 hours. BNP (last 3 results) No results for input(s): "PROBNP" in the last 8760 hours. HbA1C: No results for input(s): "HGBA1C" in the last 72 hours. CBG: Recent Labs  Lab 07/30/23 0726  07/30/23 1137 07/30/23 1642 07/30/23 2151 07/31/23 0744  GLUCAP 104* 103* 109* 101* 86   Lipid Profile: No results for input(s): "CHOL", "HDL", "LDLCALC", "TRIG", "CHOLHDL", "LDLDIRECT" in the last 72 hours. Thyroid Function Tests: No results for input(s): "TSH", "T4TOTAL", "FREET4", "T3FREE", "THYROIDAB" in the last 72 hours. Anemia Panel: No results for input(s): "VITAMINB12", "FOLATE", "FERRITIN", "TIBC", "IRON", "RETICCTPCT" in the last 72 hours. Sepsis Labs: No results for input(s): "PROCALCITON", "LATICACIDVEN" in the last 168 hours.  Recent Results (from the past 240 hours)  SARS Coronavirus 2 by RT PCR (hospital order, performed in [facility] hospital lab) *cepheid single result test* Anterior Nasal Swab     Status: None   Collection Time: 07/25/23  1:30 AM   Specimen: Anterior Nasal Swab  Result Value Ref Range Status   SARS Coronavirus 2 by RT PCR NEGATIVE NEGATIVE Final    Comment: Performed at [facility], [address], [place], [place] [address]         Radiology Studies: DG Chest 2 View Result Date: 07/29/2023 CLINICAL DATA:  Weakness skip EXAM: CHEST - 2 VIEW COMPARISON:  Chest x-ray 12/12/2021 FINDINGS: The heart size and mediastinal contours are within normal limits. Both lungs are clear. The visualized skeletal structures are unremarkable. IMPRESSION: No active cardiopulmonary disease. Electronically Signed   By: Darliss Cheney M.D.   On: 07/29/2023 20:34        Scheduled Meds:  ascorbic acid  500 mg Oral BID   atorvastatin  20 mg Oral Daily   calcium acetate  1,334 mg Oral TID WC   fluticasone  2 spray Each Nare Daily   folic acid  1 mg Oral Daily   furosemide  80 mg Oral Daily   gabapentin  300 mg Oral QHS   hydrochlorothiazide  12.5 mg Oral Daily   insulin aspart  0-15 Units Subcutaneous TID WC   insulin aspart  0-5 Units Subcutaneous QHS   irbesartan  75 mg Oral Daily   levothyroxine  125 mcg Oral Q0600   loratadine  10 mg Oral  Daily   [START ON 08/01/2023] midodrine  5 mg Oral Once per day on Monday Wednesday Friday   multivitamin  1 tablet Oral QHS   pantoprazole  40 mg Oral Daily   propranolol  20 mg Oral TID   senna-docusate  2 tablet Oral QHS   valACYclovir  1,000 mg Oral TID   zinc sulfate (50mg  elemental zinc)  220 mg Oral Daily   Continuous Infusions:   LOS: 0 days    Charise Killian, MD [facility] Pager [phone]  If 7PM-7AM, please contact night-coverage [email or web address] 07/31/2023, 9:05 AM

## 2023-07-31 NOTE — Progress Notes (Signed)
 [facility]  PROGRESS NOTE   Subjective:   Patient seen at bedside.  Complains of her blood pressure being lower again.  She like to get off all antihypertensive medications and also diuretics.  Objective:  Vital signs: Blood pressure (!) 82/62, pulse 72, temperature 98.5 F (36.9 C), resp. rate 16, height 6' (1.829 m), weight 90.6 kg, SpO2 100%.  Intake/Output Summary (Last 24 hours) at 07/31/2023 1224 Last data filed at 07/31/2023 1033 Gross per 24 hour  Intake 300 ml  Output --  Net 300 ml   Filed Weights   07/29/23 1842 07/30/23 1622  Weight: 87.8 kg 90.6 kg     Physical Exam: General:  No acute distress  Head:  Normocephalic, atraumatic. Moist oral mucosal membranes  Eyes:  Anicteric  Neck:  Supple  Lungs:   Clear to auscultation, normal effort  Heart:  S1S2 no rubs  Abdomen:   Soft, nontender, bowel sounds present  Extremities:  peripheral edema.  Neurologic:  Awake, alert, following commands  Skin:  No lesions  Access:     Basic Metabolic Panel: Recent Labs  Lab 07/25/23 0142 07/26/23 0422 07/27/23 0811 07/29/23 1930 07/30/23 0716  NA 139 134* 134* 134* 136  K 5.5* 5.0 4.7 3.9 3.6  CL 99 95* 94* 94* 97*  CO2 21* 27 23 21* 24  GLUCOSE 133* 94 89 88 93  BUN 96* 45* 70* 25* 33*  CREATININE 9.72* 6.43* 9.49* 4.98* 6.12*  CALCIUM 9.8 8.6* 8.3* 9.8 9.2  PHOS  --   --  8.1*  --   --    GFR: Estimated Creatinine Clearance: 12.5 mL/min (A) (by C-G formula based on SCr of 6.12 mg/dL (H)).  Liver Function Tests: Recent Labs  Lab 07/25/23 0142 07/26/23 0422 07/27/23 0811 07/29/23 1930  AST 24 23  --  36  ALT 21 16  --  21  ALKPHOS 58 58  --  63  BILITOT 0.8 0.9  --  1.4*  PROT 7.4 7.1  --  7.9  ALBUMIN 3.8 3.5 3.2* 4.0   Recent Labs  Lab 07/25/23 0142 07/29/23 1930  LIPASE 96* 73*   Recent Labs  Lab 07/25/23 0925  AMMONIA 28    CBC: Recent Labs  Lab 07/25/23 0142 07/26/23 0422 07/27/23 0511 07/29/23 1930 07/30/23 0716   WBC 5.1 3.1* 2.8* 2.6* 2.1*  HGB 11.7* 11.2* 11.4* 13.0 12.6  HCT 38.3 35.3* 36.6 41.4 39.7  MCV 93.9 91.7 93.6 92.6 94.7  PLT 42* 35* 33* 21* 31*     HbA1C: Hgb A1c MFr Bld  Date/Time Value Ref Range Status  03/28/2023 07:43 AM 5.6 4.8 - 5.6 % Final    Comment:    (NOTE) Pre diabetes:          5.7%-6.4%  Diabetes:              >6.4%  Glycemic control for   <7.0% adults with diabetes   12/09/2021 03:48 PM 4.9 4.8 - 5.6 % Final    Comment:    (NOTE) Pre diabetes:          5.7%-6.4%  Diabetes:              >6.4%  Glycemic control for   <7.0% adults with diabetes     Urinalysis: Recent Labs    07/30/23 0633  COLORURINE AMBER*  LABSPEC 1.013  PHURINE 7.0  GLUCOSEU NEGATIVE  HGBUR SMALL*  BILIRUBINUR NEGATIVE  KETONESUR NEGATIVE  PROTEINUR 100*  NITRITE NEGATIVE  LEUKOCYTESUR LARGE*      Imaging: DG Chest 2 View Result Date: 07/29/2023 CLINICAL DATA:  Weakness skip EXAM: CHEST - 2 VIEW COMPARISON:  Chest x-ray 12/12/2021 FINDINGS: The heart size and mediastinal contours are within normal limits. Both lungs are clear. The visualized skeletal structures are unremarkable. IMPRESSION: No active cardiopulmonary disease. Electronically Signed   By: Darliss Cheney M.D.   On: 07/29/2023 20:34     Medications:     ascorbic acid  500 mg Oral BID   atorvastatin  20 mg Oral Daily   calcium acetate  1,334 mg Oral TID WC   Chlorhexidine Gluconate Cloth  6 each Topical Q0600   [START ON 08/01/2023] Chlorhexidine Gluconate Cloth  6 each Topical Q0600   fluticasone  2 spray Each Nare Daily   folic acid  1 mg Oral Daily   gabapentin  100 mg Oral QHS   insulin aspart  0-15 Units Subcutaneous TID WC   insulin aspart  0-5 Units Subcutaneous QHS   levothyroxine  125 mcg Oral Q0600   [START ON 08/01/2023] midodrine  5 mg Oral Once per day on Monday Wednesday Friday   multivitamin  1 tablet Oral QHS   pantoprazole  40 mg Oral Daily   senna-docusate  2 tablet Oral QHS    valACYclovir  1,000 mg Oral TID   zinc sulfate (50mg  elemental zinc)  220 mg Oral Daily    Assessment/ Plan:     58 year old female with history of hypertension, hypothyroidism, diabetes, asthma, osteoarthritis and end-stage renal disease on dialysis on a Monday Wednesday Friday schedule.  She had a dialysis on Friday at her outpatient dialysis center.  After she went home she developed generalized weakness, presyncopal episode associated with nausea and some dyspnea.  She denied any chest pain, fever, chills or palpitations.  She was given IV fluids on admission.    ESRD: Her next dialysis is scheduled for Monday.  Will start her on midodrine 5 mg predialysis on Monday Wednesday Friday.   ANEMIA: Hemoglobin is stable.   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  Continue lanthanum.   HTN/VOL: Will discontinue the antihypertensive medications and also diuretics.    Chronic pain syndrome: I reduced the dose of gabapentin.  She also wants to take the Percocet only on an as-needed basis.  Spoke to the patient and her husband at bedside in detail and answered all their questions to their satisfaction.  Labs and medications reviewed. Will continue to follow along with you.   LOS: 0 Lorain Childes, MD [facility] 4/6/202512:24 PM

## 2023-07-31 NOTE — Plan of Care (Signed)

## 2023-07-31 NOTE — Evaluation (Signed)
 Physical Therapy Evaluation Patient Details Name: Alvah Gilder MRN: [id number] DOB: 1965/06/02 Today's Date: 07/31/2023  History of Present Illness  Aivy Akter is a 57yoF who [facility] 07/29[facility]23 d/t concerns of hypotension at HD, also has pain in feet and hands. Pt DC frm here 2 days prior. PMH: ESRD on HD MWF, OA, asthma, GAD, HTN, hypoTSH, DM2.  Clinical Impression  Pt reports to feel much better today. Despite low pressures at 8a vitals, pt is able to perform all of their mobility without any physical assistance and without any concerning symptoms. Pt AMB farther today than on our previous assessment. There is a slight BP drop after AMB phase, but stable and without symptoms. Will continue to follow.    Orthostatic VS for the past 24 hrs:  BP- Lying Pulse- Lying BP- Sitting Pulse- Sitting BP- Standing at 0 minutes Pulse- Standing at 0 minutes  07/31/23 1039 -- -- 118/74 79 -- --  07/30/23 1150 102/72 85 (!) 157/120 81 (!) 176/146 94        If plan is discharge home, recommend the following: Assist for transportation;Assistance with cooking/housework;Help with stairs or ramp for entrance   Can travel by private vehicle        Equipment Recommendations Rolling walker (2 wheels);BSC/3in1  Recommendations for Other Services       Functional Status Assessment Patient has had a recent decline in their functional status and demonstrates the ability to make significant improvements in function in a reasonable and predictable amount of time.     Precautions / Restrictions Precautions Precautions: Fall Recall of Precautions/Restrictions: Intact Restrictions Weight Bearing Restrictions Per Provider Order: No      Mobility  Bed Mobility Overal bed mobility: Independent                  Transfers Overall transfer level: Needs assistance Equipment used: None Transfers: Sit to/from Stand Sit to Stand: Supervision                 Ambulation/Gait Ambulation/Gait assistance: Contact guard assist Gait Distance (Feet): 270 Feet Assistive device: Rolling walker (2 wheels) Gait Pattern/deviations: Step-through pattern       General Gait Details: reports to feel good moving with RW, improved steadiness compared to SPC; post AMB BP show sslight BP dop compared to sitting (~95mmHg SBP)  Stairs            Wheelchair Mobility     Tilt Bed    Modified Rankin (Stroke Patients Only)       Balance                                             Pertinent Vitals/Pain Pain Assessment Pain Assessment: No/denies pain    Home Living Family/patient expects to be discharged to:: Private residence Living Arrangements: Alone Available Help at Discharge: Family;Available PRN/intermittently Type of Home: House Home Access: Stairs to enter Entrance Stairs-Rails: None Entrance Stairs-Number of Steps: 2 Alternate Level Stairs-Number of Steps: flight to 2nd floor bedroom Home Layout: Two level;Able to live on main level with bedroom/bathroom Home Equipment: Grab bars - tub/shower;Cane - single point;Rollator (4 wheels)      Prior Function Prior Level of Function : Independent/Modified Independent;Driving             Mobility Comments: Modified independent ambulating with SPC. Reports 2 recent falls entering/leaving home  on the stairs ADLs Comments: MOD I-I with ADL, reports shes having trouble with med management, cooks/cleans/drive     Extremity/Trunk Assessment                Communication        Cognition Arousal: Alert Behavior During Therapy: WFL for tasks assessed/performed   PT - Cognitive impairments: No apparent impairments                                 Cueing       General Comments      Exercises     Assessment/Plan    PT Assessment Patient needs continued PT services  PT Problem List Decreased strength;Decreased balance;Decreased  mobility;Decreased knowledge of use of DME;Decreased safety awareness       PT Treatment Interventions DME instruction;Gait training;Therapeutic exercise;Balance training    PT Goals (Current goals can be found in the Care Plan section)  Acute Rehab PT Goals Patient Stated Goal: to go home PT Goal Formulation: With patient Time For Goal Achievement: 08/14/23 Potential to Achieve Goals: Good    Frequency Min 2X/week     Co-evaluation               AM-PAC PT "6 Clicks" Mobility  Outcome Measure Help needed turning from your back to your side while in a flat bed without using bedrails?: None Help needed moving from lying on your back to sitting on the side of a flat bed without using bedrails?: None Help needed moving to and from a bed to a chair (including a wheelchair)?: A Little Help needed standing up from a chair using your arms (e.g., wheelchair or bedside chair)?: A Little Help needed to walk in hospital room?: A Little Help needed climbing 3-5 steps with a railing? : A Lot 6 Click Score: 19    End of Session Equipment Utilized During Treatment: Gait belt Activity Tolerance: Patient tolerated treatment well;Patient limited by fatigue Patient left: in bed;with call bell/phone within reach;with family/visitor present Nurse Communication: Mobility status PT Visit Diagnosis: Muscle weakness (generalized) (M62.81);History of falling (Z91.81);Unsteadiness on feet (R26.81);Difficulty in walking, not elsewhere classified (R26.2)    Time: 9147-8295 PT Time Calculation (min) (ACUTE ONLY): 20 min   Charges:   PT Evaluation $PT Eval Moderate Complexity: 1 Mod PT Treatments $Therapeutic Activity: 8-22 mins PT General Charges $$ ACUTE PT VISIT: 1 Visit        11:50 AM, 07/31/23 Rosamaria Lints, PT, DPT Physical Therapist - [facility]  [phone] (ASCOM)    Alexa Golebiewski C 07/31/2023, 11:47 AM

## 2023-08-01 DIAGNOSIS — D696 Thrombocytopenia, unspecified: Secondary | ICD-10-CM

## 2023-08-01 LAB — BASIC METABOLIC PANEL WITH GFR
Anion gap: 19 — ABNORMAL HIGH (ref 5–15)
BUN: 75 mg/dL — ABNORMAL HIGH (ref 6–20)
CO2: 20 mmol/L — ABNORMAL LOW (ref 22–32)
Calcium: 8.7 mg/dL — ABNORMAL LOW (ref 8.9–10.3)
Chloride: 99 mmol/L (ref 98–111)
Creatinine, Ser: 10.42 mg/dL — ABNORMAL HIGH (ref 0.44–1.00)
GFR, Estimated: 4 mL/min — ABNORMAL LOW (ref >60)
Glucose, Bld: 82 mg/dL (ref 70–99)
Potassium: 5.1 mmol/L (ref 3.5–5.1)
Sodium: 138 mmol/L (ref 135–145)

## 2023-08-01 LAB — CBC
HCT: 44.4 % (ref 36.0–46.0)
Hemoglobin: 14.4 g/dL (ref 12.0–15.0)
MCH: 29.6 pg (ref 26.0–34.0)
MCHC: 32.4 g/dL (ref 30.0–36.0)
MCV: 91.2 fL (ref 80.0–100.0)
Platelets: 24 10*3/uL — CL (ref 150–400)
RBC: 4.87 MIL/uL (ref 3.87–5.11)
RDW: 21.2 % — ABNORMAL HIGH (ref 11.5–15.5)
WBC: 3 10*3/uL — ABNORMAL LOW (ref 4.0–10.5)
nRBC: 0 % (ref 0.0–0.2)

## 2023-08-01 LAB — URINALYSIS, ROUTINE W REFLEX MICROSCOPIC
Bilirubin Urine: NEGATIVE
Glucose, UA: NEGATIVE mg/dL
Ketones, ur: NEGATIVE mg/dL
Nitrite: NEGATIVE
Protein, ur: 100 mg/dL — AB
Specific Gravity, Urine: 1.012 (ref 1.005–1.030)
Squamous Epithelial / HPF: >50 /HPF (ref 0–5)
WBC, UA: >50 WBC/hpf (ref 0–5)
pH: 9 — ABNORMAL HIGH (ref 5.0–8.0)

## 2023-08-01 LAB — GLUCOSE, CAPILLARY
Glucose-Capillary: 81 mg/dL (ref 70–99)
Glucose-Capillary: 63 mg/dL — ABNORMAL LOW (ref 70–99)
Glucose-Capillary: 75 mg/dL (ref 70–99)
Glucose-Capillary: 96 mg/dL (ref 70–99)

## 2023-08-01 LAB — HEMOGLOBIN A1C
Hgb A1c MFr Bld: 5.6 % (ref 4.8–5.6)
Mean Plasma Glucose: 114 mg/dL

## 2023-08-01 MED ORDER — PENTAFLUOROPROP-TETRAFLUOROETH EX AERO: 1.0000 | INHALATION_SPRAY | CUTANEOUS | Status: DC | PRN

## 2023-08-01 MED ORDER — HEPARIN SODIUM (PORCINE) 1000 UNIT/ML DIALYSIS: 1000.0000 [IU] | INTRAMUSCULAR | Status: DC | PRN

## 2023-08-01 MED ORDER — LIDOCAINE-PRILOCAINE 2.5-2.5 % EX CREA: 1.0000 | TOPICAL_CREAM | CUTANEOUS | Status: DC | PRN

## 2023-08-01 MED ORDER — ALTEPLASE 2 MG IJ SOLR: 2.0000 mg | Freq: Once | INTRAMUSCULAR | Status: DC | PRN

## 2023-08-01 MED ORDER — GABAPENTIN 100 MG PO CAPS
100.0000 mg | ORAL_CAPSULE | Freq: Two times a day (BID) | ORAL | Status: DC
  Administered 2023-08-01 – 2023-08-03 (×4): 100 mg via ORAL
  Filled 2023-08-01 (×5): qty 1

## 2023-08-01 MED ORDER — LIDOCAINE HCL (PF) 1 % IJ SOLN: 5.0000 mL | INTRAMUSCULAR | Status: DC | PRN

## 2023-08-01 MED ORDER — ANTICOAGULANT SODIUM CITRATE 4% (200MG/5ML) IV SOLN: 5.0000 mL | Status: DC | PRN

## 2023-08-01 MED ORDER — VALACYCLOVIR HCL 500 MG PO TABS
500.0000 mg | ORAL_TABLET | Freq: Every day | ORAL | Status: AC
  Administered 2023-08-02 – 2023-08-03 (×2): 500 mg via ORAL
  Filled 2023-08-01 (×2): qty 1

## 2023-08-01 NOTE — Progress Notes (Signed)
 PROGRESS NOTE    Tonya Myers  ZOX:[id number] DOB: [birth year] DOA: 07/29/2023 PCP: [name], MD  Assessment & Plan:   Principal Problem:   Near syncope Active Problems:   Essential hypertension   ESRD (end stage renal disease) (HCC)   Dyslipidemia   GERD without esophagitis   Peripheral neuropathy  Assessment and Plan: Near syncope: etiology unclear. Orthostatics are intermittently positive. Continue on tele  Myocardial injury: w/ elevated troponins, likely secondary to demand ischemia.    ESRD: on HD. Nephro following and recs apprec    HTN: holding home dose of hydrochlorothiazide, irbesartan for low normal BP.    HLD: continue on statin    Peripheral neuropathy: continue on gabapentin     GERD: continue on PPI   Thrombocytopenia: significant. Etiology unclear. Labile. Holding aspirin & SQ heparin. Hx of CLL. Suppose to getting a bone marrow biopsy w/ [facility] heme soon as per pt but wants to see if bone marrow biopsy can be done sooner. Onco consulted, Dr. Orlie Dakin. Possible inpatient bone marrow biopsy in next 24-48 hrs   Generalized weakness: PT/OT recs HH     DVT prophylaxis: SCDs only secondary to thrombocytopenia Code Status: full  Family Communication:  Disposition Plan: likely d/c back home w/ HH   Level of care: Telemetry Cardiac  Status is: Observation The patient remains OBS appropriate and will d/c before 2 midnights.    Consultants:  Nephro   Procedures:   Antimicrobials:   Subjective: Pt c/o fatigue   Objective: Vitals:   07/31/23 1616 07/31/23 1942 08/01/23 0010 08/01/23 0408  BP: 93/61 127/79 108/66 113/71  Pulse: 75 84 86 74  Resp: 18 16 16 16   Temp: 98.4 F (36.9 C) 97.6 F (36.4 C) 97.7 F (36.5 C) 97.7 F (36.5 C)  TempSrc: Oral     SpO2: 92% 93% 99% 100%  Weight:      Height:        Intake/Output Summary (Last 24 hours) at 08/01/2023 0903 Last data filed at 07/31/2023 1553 Gross per 24 hour  Intake 120 ml   Output --  Net 120 ml   Filed Weights   07/29/23 1842 07/30/23 1622  Weight: 87.8 kg 90.6 kg    Examination:  General exam: appears calm & comfortable   Respiratory system: clear breath sounds b/l  Cardiovascular system: S1 & S2+. No rubs or clicks  Gastrointestinal system: abd is soft, NT, ND & hypoactive bowel sounds  Central nervous system: alert & oriented. Moves all extremities  Psychiatry: Judgement and insight appears at baseline. Flat mood and affect    Data Reviewed: I have personally reviewed following labs and imaging studies  CBC: Recent Labs  Lab 07/27/23 0511 07/29/23 1930 07/30/23 0716 07/31/23 1401 08/01/23 0708  WBC 2.8* 2.6* 2.1* 3.0* 3.0*  HGB 11.4* 13.0 12.6 12.0 14.4  HCT 36.6 41.4 39.7 38.7 44.4  MCV 93.6 92.6 94.7 95.8 91.2  PLT 33* 21* 31* 36* 24*   Basic Metabolic Panel: Recent Labs  Lab 07/27/23 0811 07/29/23 1930 07/30/23 0716 07/31/23 1401 08/01/23 0708  NA 134* 134* 136 137 138  K 4.7 3.9 3.6 4.1 5.1  CL 94* 94* 97* 96* 99  CO2 23 21* 24 24 20*  GLUCOSE 89 88 93 126* 82  BUN 70* 25* 33* 62* 75*  CREATININE 9.49* 4.98* 6.12* 8.79* 10.42*  CALCIUM 8.3* 9.8 9.2 8.9 8.7*  PHOS 8.1*  --   --   --   --  GFR: Estimated Creatinine Clearance: 7.4 mL/min (A) (by C-G formula based on SCr of 10.42 mg/dL (H)). Liver Function Tests: Recent Labs  Lab 07/26/23 0422 07/27/23 0811 07/29/23 1930  AST 23  --  36  ALT 16  --  21  ALKPHOS 58  --  63  BILITOT 0.9  --  1.4*  PROT 7.1  --  7.9  ALBUMIN 3.5 3.2* 4.0   Recent Labs  Lab 07/29/23 1930  LIPASE 73*   Recent Labs  Lab 07/25/23 0925  AMMONIA 28   Coagulation Profile: No results for input(s): "INR", "PROTIME" in the last 168 hours. Cardiac Enzymes: No results for input(s): "CKTOTAL", "CKMB", "CKMBINDEX", "TROPONINI" in the last 168 hours. BNP (last 3 results) No results for input(s): "PROBNP" in the last 8760 hours. HbA1C: No results for input(s): "HGBA1C" in the  last 72 hours. CBG: Recent Labs  Lab 07/31/23 0744 07/31/23 1231 07/31/23 1618 07/31/23 2115 08/01/23 0848  GLUCAP 86 100* 118* 138* 81   Lipid Profile: No results for input(s): "CHOL", "HDL", "LDLCALC", "TRIG", "CHOLHDL", "LDLDIRECT" in the last 72 hours. Thyroid Function Tests: No results for input(s): "TSH", "T4TOTAL", "FREET4", "T3FREE", "THYROIDAB" in the last 72 hours. Anemia Panel: No results for input(s): "VITAMINB12", "FOLATE", "FERRITIN", "TIBC", "IRON", "RETICCTPCT" in the last 72 hours. Sepsis Labs: No results for input(s): "PROCALCITON", "LATICACIDVEN" in the last 168 hours.  Recent Results (from the past 240 hours)  SARS Coronavirus 2 by RT PCR (hospital order, performed in [facility] hospital lab) *cepheid single result test* Anterior Nasal Swab     Status: None   Collection Time: 07/25/23  1:30 AM   Specimen: Anterior Nasal Swab  Result Value Ref Range Status   SARS Coronavirus 2 by RT PCR NEGATIVE NEGATIVE Final    Comment: Performed at [facility], [address] [place], [place] [address]         Radiology Studies: No results found.       Scheduled Meds:  ascorbic acid  500 mg Oral BID   atorvastatin  20 mg Oral Daily   calcium acetate  1,334 mg Oral TID WC   Chlorhexidine Gluconate Cloth  6 each Topical Q0600   fluticasone  2 spray Each Nare Daily   folic acid  1 mg Oral Daily   gabapentin  100 mg Oral QHS   insulin aspart  0-15 Units Subcutaneous TID WC   insulin aspart  0-5 Units Subcutaneous QHS   levothyroxine  125 mcg Oral Q0600   midodrine  5 mg Oral Once per day on Monday Wednesday Friday   multivitamin  1 tablet Oral QHS   pantoprazole  40 mg Oral Daily   senna-docusate  2 tablet Oral QHS   valACYclovir  1,000 mg Oral TID   zinc sulfate (50mg  elemental zinc)  220 mg Oral Daily   Continuous Infusions:  anticoagulant sodium citrate       LOS: 0 days    Charise Killian, MD [facility] Pager [phone]  [phone]  If 7PM-7AM, please contact night-coverage [email or web address] 08/01/2023, 9:03 AM

## 2023-08-01 NOTE — Consult Note (Signed)
 PHARMACY NOTE:  ANTIMICROBIAL RENAL DOSAGE ADJUSTMENT  Current antimicrobial regimen includes a mismatch between antimicrobial dosage and estimated renal function.  As per policy approved by the Pharmacy & Therapeutics and Medical Executive Committees, the antimicrobial dosage will be adjusted accordingly.  Current antimicrobial dosage:  valacyclovir 1000 mg TID  Indication: Herpes simplex vulvovaginitis   Renal Function:  Estimated Creatinine Clearance: 7.3 mL/min (A) (by C-G formula based on SCr of 10.42 mg/dL (H)). []      On intermittent HD, scheduled: []      On CRRT    Antimicrobial dosage has been changed to:  valacyclovir 500 mg daily.   Additional comments:   Thank you for allowing pharmacy to be a part of this patient's care.  Ronnald Ramp, [facility] 08/01/2023 12:11 PM

## 2023-08-01 NOTE — Progress Notes (Signed)
 [facility]  ROUNDING NOTE   Subjective:   Tonya Myers is a 58 year old female with past medical history including depression, type 2 diabetes, asthma, hypertension, hypothyroidism, CLL, and end-stage renal disease on hemodialysis.  She presents to the emergency department with near syncopal episode after dialysis. She has been admitted under observation for Weakness [R53.1] Unsteady gait [R26.81] Elevated troponin [R79.89] Chest pain [R07.9] Chest pain, unspecified type [R07.9]   Patient is known our practice and receives outpatient dialysis treatments at [facility] on a MWF schedule, supervised by [facility] physicians.    Patient seen resting in bed States she feels well today Does report some weakness with activity  Scheduled for dialysis later today   Objective:  Vital signs in last 24 hours:  Temp:  [97.6 F (36.4 C)-98.4 F (36.9 C)] 98.2 F (36.8 C) (04/07 1155) Pulse Rate:  [74-86] 84 (04/07 1330) Resp:  [13-22] 14 (04/07 1330) BP: (93-127)/(61-79) 109/72 (04/07 1330) SpO2:  [92 %-100 %] 100 % (04/07 1330) Weight:  [88.9 kg] 88.9 kg (04/07 1155)  Weight change:  Filed Weights   07/29/23 1842 07/30/23 1622 08/01/23 1155  Weight: 87.8 kg 90.6 kg 88.9 kg    Intake/Output: I/O last 3 completed shifts: In: 300 [P.O.:300] Out: -    Intake/Output this shift:  Total I/O In: 240 [P.O.:240] Out: -   Physical Exam: General: NAD  Head: Normocephalic, atraumatic. Moist oral mucosal membranes  Eyes: Anicteric  Lungs:  Clear to auscultation, normal effort  Heart: Regular rate and rhythm  Abdomen:  Soft, nontender  Extremities:  No peripheral edema.  Neurologic: Alert and oriented moving all four extremities  Skin: No lesions  Access: Lt AVG    Basic Metabolic Panel: Recent Labs  Lab 07/27/23 0811 07/29/23 1930 07/30/23 0716 07/31/23 1401 08/01/23 0708  NA 134* 134* 136 137 138  K 4.7 3.9 3.6 4.1 5.1  CL 94* 94* 97* 96* 99   CO2 23 21* 24 24 20*  GLUCOSE 89 88 93 126* 82  BUN 70* 25* 33* 62* 75*  CREATININE 9.49* 4.98* 6.12* 8.79* 10.42*  CALCIUM 8.3* 9.8 9.2 8.9 8.7*  PHOS 8.1*  --   --   --   --     Liver Function Tests: Recent Labs  Lab 07/26/23 0422 07/27/23 0811 07/29/23 1930  AST 23  --  36  ALT 16  --  21  ALKPHOS 58  --  63  BILITOT 0.9  --  1.4*  PROT 7.1  --  7.9  ALBUMIN 3.5 3.2* 4.0   Recent Labs  Lab 07/29/23 1930  LIPASE 73*   No results for input(s): "AMMONIA" in the last 168 hours.   CBC: Recent Labs  Lab 07/27/23 0511 07/29/23 1930 07/30/23 0716 07/31/23 1401 08/01/23 0708  WBC 2.8* 2.6* 2.1* 3.0* 3.0*  HGB 11.4* 13.0 12.6 12.0 14.4  HCT 36.6 41.4 39.7 38.7 44.4  MCV 93.6 92.6 94.7 95.8 91.2  PLT 33* 21* 31* 36* 24*    Cardiac Enzymes: No results for input(s): "CKTOTAL", "CKMB", "CKMBINDEX", "TROPONINI" in the last 168 hours.  BNP: Invalid input(s): "POCBNP"  CBG: Recent Labs  Lab 07/31/23 0744 07/31/23 1231 07/31/23 1618 07/31/23 2115 08/01/23 0848  GLUCAP 86 100* 118* 138* 81    Microbiology: Results for orders placed or performed during the hospital encounter of 07/25/23  SARS Coronavirus 2 by RT PCR (hospital order, performed in [facility] hospital lab) *cepheid single result test* Anterior Nasal Swab  Status: None   Collection Time: 07/25/23  1:30 AM   Specimen: Anterior Nasal Swab  Result Value Ref Range Status   SARS Coronavirus 2 by RT PCR NEGATIVE NEGATIVE Final    Comment: Performed at [facility], [address] [place], [place] [address]    Coagulation Studies: No results for input(s): "LABPROT", "INR" in the last 72 hours.  Urinalysis: Recent Labs    07/30/23 0633 08/01/23 0945  COLORURINE AMBER* YELLOW*  LABSPEC 1.013 1.012  PHURINE 7.0 9.0*  GLUCOSEU NEGATIVE NEGATIVE  HGBUR SMALL* SMALL*  BILIRUBINUR NEGATIVE NEGATIVE  KETONESUR NEGATIVE NEGATIVE  PROTEINUR 100* 100*  NITRITE NEGATIVE NEGATIVE   LEUKOCYTESUR LARGE* LARGE*      Imaging: No results found.    Medications:    anticoagulant sodium citrate       ascorbic acid  500 mg Oral BID   atorvastatin  20 mg Oral Daily   calcium acetate  1,334 mg Oral TID WC   Chlorhexidine Gluconate Cloth  6 each Topical Q0600   fluticasone  2 spray Each Nare Daily   folic acid  1 mg Oral Daily   gabapentin  100 mg Oral QHS   insulin aspart  0-15 Units Subcutaneous TID WC   insulin aspart  0-5 Units Subcutaneous QHS   levothyroxine  125 mcg Oral Q0600   midodrine  5 mg Oral Once per day on Monday Wednesday Friday   multivitamin  1 tablet Oral QHS   pantoprazole  40 mg Oral Daily   senna-docusate  2 tablet Oral QHS   [START ON 08/02/2023] valACYclovir  500 mg Oral Daily   zinc sulfate (50mg  elemental zinc)  220 mg Oral Daily   acetaminophen **OR** acetaminophen, albuterol, alteplase, anticoagulant sodium citrate, heparin, hydrOXYzine, lidocaine (PF), lidocaine-prilocaine, magnesium hydroxide, morphine injection, ondansetron **OR** ondansetron (ZOFRAN) IV, oxyCODONE-acetaminophen, pentafluoroprop-tetrafluoroeth, pentafluoroprop-tetrafluoroeth, traZODone  Assessment/ Plan:  [name] is a 58 y.o.  female  with past medical history including depression, type 2 diabetes, asthma, hypertension, hypothyroidism, CLL, and end-stage renal disease on hemodialysis.  She presents to the emergency department with fever and diarrhea. She has been admitted under observation for Weakness [R53.1] Unsteady gait [R26.81] Elevated troponin [R79.89] Chest pain [R07.9] Chest pain, unspecified type [R07.9]   [facility] /MWF/right AVF/91 kg   Near syncopal episode, likely secondary to orthostatic.  Midodrine 5 mg ordered with dialysis 3 times weekly.  End stage renal disease on hemodialysis.  Patient scheduled for dialysis today, UF goal 1 L as tolerated.  Next treatment scheduled for Wednesday.  Will defer discharge plan to  primary team.  3. 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  Lab Results  Component Value Date   HGB 14.4 08/01/2023    Thrombocytopenia noted outpatient also. Mircera prescribed at outpatient clinic.  Hemoglobin stable.  4. Secondary Hyperparathyroidism: with outpatient labs: PTH 223, phosphorus 6.5, calcium 9.8 on 06/27/23.   Lab Results  Component Value Date   CALCIUM 8.7 (L) 08/01/2023   PHOS 8.1 (H) 07/27/2023   Currently prescribed calcitriol and calcium acetate outpatient.  Hyperphosphatemia noted.  Continue calcium acetate with meals.     LOS: 0 Elise Knobloch 4/7/20251:39 PM

## 2023-08-01 NOTE — Progress Notes (Signed)
 Hemodialysis Note:  Received patient in bed to unit. Alert and oriented. Informed consent singed and in chart.  Treatment initiated: 1222 Treatment completed: 1558  Access used: Right AVF Access issues: None  Patient tolerated well. Transported back to room, alert without acute distress. Report given to patient's RN.  Total UF removed: 1 Liter Medications given: None  Post HD weight: 87.9 Kg  [facility]

## 2023-08-02 DIAGNOSIS — R55 Syncope and collapse: Secondary | ICD-10-CM | POA: Diagnosis not present

## 2023-08-02 DIAGNOSIS — D696 Thrombocytopenia, unspecified: Secondary | ICD-10-CM | POA: Diagnosis not present

## 2023-08-02 LAB — BASIC METABOLIC PANEL WITH GFR
Anion gap: 13 (ref 5–15)
BUN: 43 mg/dL — ABNORMAL HIGH (ref 6–20)
CO2: 27 mmol/L (ref 22–32)
Calcium: 8.7 mg/dL — ABNORMAL LOW (ref 8.9–10.3)
Chloride: 93 mmol/L — ABNORMAL LOW (ref 98–111)
Creatinine, Ser: 6.96 mg/dL — ABNORMAL HIGH (ref 0.44–1.00)
GFR, Estimated: 6 mL/min — ABNORMAL LOW (ref >60)
Glucose, Bld: 94 mg/dL (ref 70–99)
Potassium: 4.3 mmol/L (ref 3.5–5.1)
Sodium: 133 mmol/L — ABNORMAL LOW (ref 135–145)

## 2023-08-02 LAB — CBC
HCT: 37.9 % (ref 36.0–46.0)
Hemoglobin: 12.1 g/dL (ref 12.0–15.0)
MCH: 29.7 pg (ref 26.0–34.0)
MCHC: 31.9 g/dL (ref 30.0–36.0)
MCV: 92.9 fL (ref 80.0–100.0)
Platelets: 25 10*3/uL — CL (ref 150–400)
RBC: 4.08 MIL/uL (ref 3.87–5.11)
RDW: 20.9 % — ABNORMAL HIGH (ref 11.5–15.5)
WBC: 2.6 10*3/uL — ABNORMAL LOW (ref 4.0–10.5)
nRBC: 0 % (ref 0.0–0.2)

## 2023-08-02 LAB — GLUCOSE, CAPILLARY
Glucose-Capillary: 117 mg/dL — ABNORMAL HIGH (ref 70–99)
Glucose-Capillary: 100 mg/dL — ABNORMAL HIGH (ref 70–99)

## 2023-08-02 NOTE — Plan of Care (Signed)
  Problem: Nutritional: Goal: Maintenance of adequate nutrition will improve Outcome: Progressing Goal: Progress toward achieving an optimal weight will improve Outcome: Progressing   Problem: Skin Integrity: Goal: Risk for impaired skin integrity will decrease Outcome: Progressing   Problem: Health Behavior/Discharge Planning: Goal: Ability to manage health-related needs will improve Outcome: Progressing

## 2023-08-02 NOTE — Progress Notes (Signed)
 PROGRESS NOTE   HPI was taken from Dr. Arville Care: Tonya Myers is a 58 y.o. African-American female with medical history significant for end-stage renal disease on hemodialysis, osteoarthritis, asthma, anxiety, hypertension and hypothyroidism as well as type 2 diabetes mellitus, who presented to the emergency room with acute onset of generalized weakness and presyncope with associated dyspnea and nausea without vomiting.  She denied any chest pain or palpitations.  She had diarrhea last week and was diagnosed with pancreatitis and her symptoms resolved.  No fever or chills.  She has been having dysuria without hematuria or urgency or frequency or flank pain.  She did not miss any sessions of hemodialysis.   ED Course: When she came to the ER, vital signs were within normal.  Labs revealed hyponatremia 134 and hypochloremia 94 with CO2 of 21, BUN of 25 and creatinine 4.98 and 9 19, total bili 1.4 and serum lipase of 73.  High-sensitivity troponin I was 40 and later 75.  CBC showed mild leukopenia of 2.6 and thrombocytopenia of 21. EKG as reviewed by me : EKG showed mild sinus tachycardia with rate 101 with left anterior fascicular block. Imaging: 2 view chest x-ray showed no acute cardiopulmonary disease.   The patient was given 4 g aspirin and 500 mL IV lactated ringer.  She will be admitted to a cardiac telemetry observation bed for further evaluation and management.    Tonya Myers  JWJ:[id number] DOB: 20-Aug-1965 DOA: 07/29/2023 PCP: [name], MD  Assessment & Plan:   Principal Problem:   Near syncope Active Problems:   Essential hypertension   ESRD (end stage renal disease) (HCC)   Dyslipidemia   GERD without esophagitis   Peripheral neuropathy  Assessment and Plan: Near syncope: etiology unclear. Orthostatics are intermittently positive. Continue on tele  Myocardial injury: w/ elevated troponins, likely secondary to demand ischemia.    ESRD: on HD. Nephro  following and rec apprec   HTN: holding home dose of hydrochlorothiazide, irbesartan for low normal BP.    HLD: continue on statin    Peripheral neuropathy: continue on gabapentin    GERD: continue on PPI   Thrombocytopenia: significant. Etiology unclear. Labile. Holding aspirin & SQ heparin. Hx of CLL. Suppose to getting a bone marrow biopsy w/ [facility] heme soon as per pt but wants to see if bone marrow biopsy can be done sooner. Onco consulted, Dr. Orlie Dakin. Possible inpatient bone marrow biopsy in next 24-48 hrs   Generalized weakness: PT/OT recs HH     DVT prophylaxis: SCDs only secondary to thrombocytopenia Code Status: full  Family Communication:  Disposition Plan: likely d/c back home w/ HH   Level of care: Telemetry Cardiac  Status is: Observation The patient remains OBS appropriate and will d/c before 2 midnights. Will hopefully have a bone marrow biopsy in 24-48 hrs     Consultants:  Nephro  Onco   Procedures:   Antimicrobials:   Subjective: Pt c/o malaise   Objective: Vitals:   08/01/23 2019 08/02/23 0002 08/02/23 0414 08/02/23 0817  BP: 138/84 114/79 (!) 94/57 100/79  Pulse: (!) 101 90 88 69  Resp: 18 19 18    Temp: 98.4 F (36.9 C) 98.4 F (36.9 C) 99.2 F (37.3 C) 99.5 F (37.5 C)  TempSrc: Oral Oral Oral Oral  SpO2: 100% 98% 98% (!) 85%  Weight:      Height:        Intake/Output Summary (Last 24 hours) at 08/02/2023 0837 Last data filed at 08/01/2023 2220  Gross per 24 hour  Intake 600 ml  Output 1020 ml  Net -420 ml   Filed Weights   07/30/23 1622 08/01/23 1155 08/01/23 1600  Weight: 90.6 kg 88.9 kg 87.9 kg    Examination:  General exam: appears comfortable   Respiratory system: clear breath sounds b/l  Cardiovascular system: S1/S2+. No rubs or gallops  Gastrointestinal system: abd is soft, NT, ND & hypoactive bowel sounds Central nervous system: alert & oriented. Moves all extremities  Psychiatry: Judgement and insight appears at  baseline. Flat mood and affect     Data Reviewed: I have personally reviewed following labs and imaging studies  CBC: Recent Labs  Lab 07/29/23 1930 07/30/23 0716 07/31/23 1401 08/01/23 0708 08/02/23 0550  WBC 2.6* 2.1* 3.0* 3.0* 2.6*  HGB 13.0 12.6 12.0 14.4 12.1  HCT 41.4 39.7 38.7 44.4 37.9  MCV 92.6 94.7 95.8 91.2 92.9  PLT 21* 31* 36* 24* 25*   Basic Metabolic Panel: Recent Labs  Lab 07/27/23 0811 07/29/23 1930 07/30/23 0716 07/31/23 1401 08/01/23 0708 08/02/23 0550  NA 134* 134* 136 137 138 133*  K 4.7 3.9 3.6 4.1 5.1 4.3  CL 94* 94* 97* 96* 99 93*  CO2 23 21* 24 24 20* 27  GLUCOSE 89 88 93 126* 82 94  BUN 70* 25* 33* 62* 75* 43*  CREATININE 9.49* 4.98* 6.12* 8.79* 10.42* 6.96*  CALCIUM 8.3* 9.8 9.2 8.9 8.7* 8.7*  PHOS 8.1*  --   --   --   --   --    GFR: Estimated Creatinine Clearance: 10.9 mL/min (A) (by C-G formula based on SCr of 6.96 mg/dL (H)). Liver Function Tests: Recent Labs  Lab 07/27/23 0811 07/29/23 1930  AST  --  36  ALT  --  21  ALKPHOS  --  63  BILITOT  --  1.4*  PROT  --  7.9  ALBUMIN 3.2* 4.0   Recent Labs  Lab 07/29/23 1930  LIPASE 73*   No results for input(s): "AMMONIA" in the last 168 hours.  Coagulation Profile: No results for input(s): "INR", "PROTIME" in the last 168 hours. Cardiac Enzymes: No results for input(s): "CKTOTAL", "CKMB", "CKMBINDEX", "TROPONINI" in the last 168 hours. BNP (last 3 results) No results for input(s): "PROBNP" in the last 8760 hours. HbA1C: No results for input(s): "HGBA1C" in the last 72 hours. CBG: Recent Labs  Lab 07/31/23 2115 08/01/23 0848 08/01/23 1635 08/01/23 1720 08/01/23 2212  GLUCAP 138* 81 63* 75 96   Lipid Profile: No results for input(s): "CHOL", "HDL", "LDLCALC", "TRIG", "CHOLHDL", "LDLDIRECT" in the last 72 hours. Thyroid Function Tests: No results for input(s): "TSH", "T4TOTAL", "FREET4", "T3FREE", "THYROIDAB" in the last 72 hours. Anemia Panel: No results for  input(s): "VITAMINB12", "FOLATE", "FERRITIN", "TIBC", "IRON", "RETICCTPCT" in the last 72 hours. Sepsis Labs: No results for input(s): "PROCALCITON", "LATICACIDVEN" in the last 168 hours.  Recent Results (from the past 240 hours)  SARS Coronavirus 2 by RT PCR (hospital order, performed in [facility] hospital lab) *cepheid single result test* Anterior Nasal Swab     Status: None   Collection Time: 07/25/23  1:30 AM   Specimen: Anterior Nasal Swab  Result Value Ref Range Status   SARS Coronavirus 2 by RT PCR NEGATIVE NEGATIVE Final    Comment: Performed at [facility], [address] [place], [place] [address]         Radiology Studies: No results found.       Scheduled Meds:  ascorbic  acid  500 mg Oral BID   atorvastatin  20 mg Oral Daily   calcium acetate  1,334 mg Oral TID WC   Chlorhexidine Gluconate Cloth  6 each Topical Q0600   fluticasone  2 spray Each Nare Daily   folic acid  1 mg Oral Daily   gabapentin  100 mg Oral BID   insulin aspart  0-15 Units Subcutaneous TID WC   insulin aspart  0-5 Units Subcutaneous QHS   levothyroxine  125 mcg Oral Q0600   midodrine  5 mg Oral Once per day on Monday Wednesday Friday   multivitamin  1 tablet Oral QHS   pantoprazole  40 mg Oral Daily   senna-docusate  2 tablet Oral QHS   valACYclovir  500 mg Oral Daily   zinc sulfate (50mg  elemental zinc)  220 mg Oral Daily   Continuous Infusions:     LOS: 0 days    Charise Killian, MD [facility] Pager [phone]  If 7PM-7AM, please contact night-coverage [email or web address] 08/02/2023, 8:37 AM

## 2023-08-02 NOTE — Progress Notes (Signed)
 [facility]  ROUNDING NOTE   Subjective:   Tonya Myers is a 58 year old female with past medical history including depression, type 2 diabetes, asthma, hypertension, hypothyroidism, CLL, and end-stage renal disease on hemodialysis.  She presents to the emergency department with near syncopal episode after dialysis. She has been admitted under observation for Weakness [R53.1] Unsteady gait [R26.81] Elevated troponin [R79.89] Chest pain [R07.9] Chest pain, unspecified type [R07.9]   Patient is known our practice and receives outpatient dialysis treatments at [facility] on a MWF schedule, supervised by [facility] physicians.    Patient seen sitting up in bed Alert and oriented No complaints to offer today   Objective:  Vital signs in last 24 hours:  Temp:  [98 F (36.7 C)-99.5 F (37.5 C)] 99.5 F (37.5 C) (04/08 0817) Pulse Rate:  [69-101] 69 (04/08 0817) Resp:  [11-20] 18 (04/08 0414) BP: (94-138)/(57-99) 100/79 (04/08 0817) SpO2:  [95 %-100 %] 95 % (04/08 0817) Weight:  [87.9 kg] 87.9 kg (04/07 1600)  Weight change:  Filed Weights   07/30/23 1622 08/01/23 1155 08/01/23 1600  Weight: 90.6 kg 88.9 kg 87.9 kg    Intake/Output: I/O last 3 completed shifts: In: 600 [P.O.:600] Out: 1020 [Urine:20; Other:1000]   Intake/Output this shift:  Total I/O In: 100 [P.O.:100] Out: -   Physical Exam: General: NAD  Head: Normocephalic, atraumatic. Moist oral mucosal membranes  Eyes: Anicteric  Lungs:  Clear to auscultation, normal effort  Heart: Regular rate and rhythm  Abdomen:  Soft, nontender  Extremities:  No peripheral edema.  Neurologic: Alert and oriented moving all four extremities  Skin: No lesions  Access: Lt AVG    Basic Metabolic Panel: Recent Labs  Lab 07/27/23 0811 07/29/23 1930 07/30/23 0716 07/31/23 1401 08/01/23 0708 08/02/23 0550  NA 134* 134* 136 137 138 133*  K 4.7 3.9 3.6 4.1 5.1 4.3  CL 94* 94* 97* 96* 99 93*  CO2  23 21* 24 24 20* 27  GLUCOSE 89 88 93 126* 82 94  BUN 70* 25* 33* 62* 75* 43*  CREATININE 9.49* 4.98* 6.12* 8.79* 10.42* 6.96*  CALCIUM 8.3* 9.8 9.2 8.9 8.7* 8.7*  PHOS 8.1*  --   --   --   --   --     Liver Function Tests: Recent Labs  Lab 07/27/23 0811 07/29/23 1930  AST  --  36  ALT  --  21  ALKPHOS  --  63  BILITOT  --  1.4*  PROT  --  7.9  ALBUMIN 3.2* 4.0   Recent Labs  Lab 07/29/23 1930  LIPASE 73*   No results for input(s): "AMMONIA" in the last 168 hours.   CBC: Recent Labs  Lab 07/29/23 1930 07/30/23 0716 07/31/23 1401 08/01/23 0708 08/02/23 0550  WBC 2.6* 2.1* 3.0* 3.0* 2.6*  HGB 13.0 12.6 12.0 14.4 12.1  HCT 41.4 39.7 38.7 44.4 37.9  MCV 92.6 94.7 95.8 91.2 92.9  PLT 21* 31* 36* 24* 25*    Cardiac Enzymes: No results for input(s): "CKTOTAL", "CKMB", "CKMBINDEX", "TROPONINI" in the last 168 hours.  BNP: Invalid input(s): "POCBNP"  CBG: Recent Labs  Lab 08/01/23 0848 08/01/23 1635 08/01/23 1720 08/01/23 2212 08/02/23 0919  GLUCAP 81 63* 75 96 117*    Microbiology: Results for orders placed or performed during the hospital encounter of 07/25/23  SARS Coronavirus 2 by RT PCR (hospital order, performed in [facility] hospital lab) *cepheid single result test* Anterior Nasal Swab  Status: None   Collection Time: 07/25/23  1:30 AM   Specimen: Anterior Nasal Swab  Result Value Ref Range Status   SARS Coronavirus 2 by RT PCR NEGATIVE NEGATIVE Final    Comment: Performed at [facility], [address] [place], [place] [address]    Coagulation Studies: No results for input(s): "LABPROT", "INR" in the last 72 hours.  Urinalysis: Recent Labs    08/01/23 0945  COLORURINE YELLOW*  LABSPEC 1.012  PHURINE 9.0*  GLUCOSEU NEGATIVE  HGBUR SMALL*  BILIRUBINUR NEGATIVE  KETONESUR NEGATIVE  PROTEINUR 100*  NITRITE NEGATIVE  LEUKOCYTESUR LARGE*      Imaging: No results found.    Medications:       ascorbic acid   500 mg Oral BID   atorvastatin  20 mg Oral Daily   calcium acetate  1,334 mg Oral TID WC   Chlorhexidine Gluconate Cloth  6 each Topical Q0600   fluticasone  2 spray Each Nare Daily   folic acid  1 mg Oral Daily   gabapentin  100 mg Oral BID   levothyroxine  125 mcg Oral Q0600   midodrine  5 mg Oral Once per day on Monday Wednesday Friday   multivitamin  1 tablet Oral QHS   pantoprazole  40 mg Oral Daily   senna-docusate  2 tablet Oral QHS   valACYclovir  500 mg Oral Daily   zinc sulfate (50mg  elemental zinc)  220 mg Oral Daily   acetaminophen **OR** acetaminophen, albuterol, hydrOXYzine, magnesium hydroxide, morphine injection, ondansetron **OR** ondansetron (ZOFRAN) IV, oxyCODONE-acetaminophen, traZODone  Assessment/ Plan:  [name] is a 58 y.o.  female  with past medical history including depression, type 2 diabetes, asthma, hypertension, hypothyroidism, CLL, and end-stage renal disease on hemodialysis.  She presents to the emergency department with fever and diarrhea. She has been admitted under observation for Weakness [R53.1] Unsteady gait [R26.81] Elevated troponin [R79.89] Chest pain [R07.9] Chest pain, unspecified type [R07.9]   [facility] [place]/MWF/right AVF/91 kg   Near syncopal episode, likely secondary to orthostatic.  Midodrine 5 mg ordered with dialysis 3 times weekly.  End stage renal disease on hemodialysis.  Dialysis received yesterday, UF 1L.  Next treatment scheduled for Wednesday.  Will defer discharge plan to primary team.  3. 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  Lab Results  Component Value Date   HGB 12.1 08/02/2023    Thrombocytopenia noted outpatient also. Mircera prescribed at outpatient clinic.  Hemoglobin at goal.  4. Secondary Hyperparathyroidism: with outpatient labs: PTH 223, phosphorus 6.5, calcium 9.8 on 06/27/23.   Lab Results  Component Value Date   CALCIUM 8.7 (L) 08/02/2023   PHOS 8.1 (H) 07/27/2023    Currently prescribed calcitriol and calcium acetate outpatient.  Hyperphosphatemia noted.  Continue calcium acetate with meals.     LOS: 0 Tonya Myers 4/8/20251:14 PM

## 2023-08-02 NOTE — Progress Notes (Signed)
 Physical Therapy Treatment Patient Details Name: Tonya Myers MRN: [id number] DOB: April 25, 1966 Today's Date: 08/02/2023   History of Present Illness Pt is a 58 year old female presented to ED with near syncope associated dyspnea and nausea without vomiting.    PMH significant for end-stage renal disease on hemodialysis, osteoarthritis, asthma, anxiety, hypertension and hypothyroidism as well as type 2 diabetes mellitus    PT Comments  Pt in bed on entry, very much motivated to get up and walking. Pt assisted with gown, tele box, RW, then AMB down to stairwell 8 and back, consistent pacing, but BUE fatigue triggers 1 standing rest break for arms. PT asks to AMB final ~63ft without device to ascertain capabilities- noted increased compensted trendelenburg gait, heavier trunk sway in frontal plane, 2 LOB arrested by pt leaning into author. Pt asks about walking later in day- will speak to MS. Pt asks for recommendation regarding Rt hand neuropathy, weakness, ache. On review of chart I see pt in midst of outpatinet workup for chronic Rt ulnar and median neuropathies with [facility], they made a referral to a tertiary provider for this, has not taken place yet due to hospitalizations. They are following for cervical radiculopathy, however I see no corresponding nerve root involvement at levels that would involve the hand and wrist to this degree. Also of note, see where she was seeing OPPT for a similar problem back in 2017, however deficits in RUE (elbow wrist hand) far more advanced than when seen at [facility] in Feb. Will continue to follow.   Strength Assessment/ MMT 08/02/23   Right  Left  Gross grasp grip Very weak  WNL  Digital ABDCT  Nearly absent WNL  Finger extension  2/5  WNL  Wrist flexion 2/5 5/5  Wrist extension  2/5 5/5  Elbow flexion 4-/5   Elbow extension  4/5       If plan is discharge home, recommend the following: Assist for transportation;Assistance with cooking/housework;Help with  stairs or ramp for entrance   Can travel by private vehicle        Equipment Recommendations  Rolling walker (2 wheels);BSC/3in1    Recommendations for Other Services       Precautions / Restrictions Precautions Precautions: Fall Restrictions Weight Bearing Restrictions Per Provider Order: No     Mobility  Bed Mobility Overal bed mobility: Modified Independent                  Transfers Overall transfer level: Modified independent Equipment used: None Transfers: Sit to/from Stand Sit to Stand: Supervision                Ambulation/Gait   Gait Distance (Feet): 395 Feet Assistive device: Rolling walker (2 wheels), None (final 62ft without device to ascertain unsteadiness degree) Gait Pattern/deviations: Step-through pattern Gait velocity: 0.73m/s     General Gait Details: reports consistent to[place]rance to AMB as 2 days prior.   Stairs             Wheelchair Mobility     Tilt Bed    Modified Rankin (Stroke Patients Only)       Balance                                            Communication    Cognition Arousal: Alert Behavior During Therapy: WFL for tasks assessed/performed   PT - Cognitive  impairments: No apparent impairments                       PT - Cognition Comments: pleasant and agreeable        Cueing    Exercises      General Comments        Pertinent Vitals/Pain Pain Assessment Pain Assessment:  (bilat feet, neurpathic pain)    Home Living                          Prior Function            PT Goals (current goals can now be found in the care plan section) Acute Rehab PT Goals Patient Stated Goal: to go home PT Goal Formulation: With patient Time For Goal Achievement: 08/14/23 Potential to Achieve Goals: Good Progress towards PT goals: Progressing toward goals    Frequency    Min 2X/week      PT Plan      Co-evaluation              AM-PAC PT  "6 Clicks" Mobility   Outcome Measure  Help needed turning from your back to your side while in a flat bed without using bedrails?: None Help needed moving from lying on your back to sitting on the side of a flat bed without using bedrails?: None Help needed moving to and from a bed to a chair (including a wheelchair)?: A Little Help needed standing up from a chair using your arms (e.g., wheelchair or bedside chair)?: A Little Help needed to walk in hospital room?: A Little Help needed climbing 3-5 steps with a railing? : A Little 6 Click Score: 20    End of Session Equipment Utilized During Treatment: Gait belt Activity Tolerance: Patient tolerated treatment well;No increased pain Patient left: in bed;with call bell/phone within reach Nurse Communication: Mobility status PT Visit Diagnosis: Muscle weakness (generalized) (M62.81);History of falling (Z91.81);Unsteadiness on feet (R26.81);Difficulty in walking, not elsewhere classified (R26.2)     Time: 2952-8413 PT Time Calculation (min) (ACUTE ONLY): 29 min  Charges:    $Therapeutic Activity: 23-37 mins PT General Charges $$ ACUTE PT VISIT: 1 Visit                    12:48 PM, 08/02/23 Rosamaria Lints, PT, DPT Physical Therapist - [facility]  [phone] (ASCOM)     Tonya Myers 08/02/2023, 12:39 PM

## 2023-08-02 NOTE — Consult Note (Signed)
 [facility]  [phone] [phone]  ID: Tonya Myers OB: [birth year]  MR#: [id number]  YQI#:[id number]  Patient Care Team: [name], MD as PCP - General (Family Medicine) Jeralyn Ruths, MD as Consulting Physician (Oncology)  CHIEF COMPLAINT: History of CLL, now with significant thrombocytopenia.  INTERVAL HISTORY: Patient is a 58 year old female on dialysis who was admitted to the hospital after acute onset syncope with hemodialysis.  Recently prior to admission patient was noted to have a significant new onset thrombocytopenia.  Patient feels well and is nearly back to her baseline.  Her platelet count remains persistently decreased.  She has no further neurologic complaints.  She denies any further syncope.  She has no night sweats, fevers, or unintentional weight loss.  She has no chest pain, shortness of breath, cough, or hemoptysis.  She denies any nausea, vomiting, constipation, diarrhea.  Patient offers no further specific complaints today.  REVIEW OF SYSTEMS:   Review of Systems  Constitutional:  Positive for malaise/fatigue. Negative for fever and weight loss.  Respiratory: Negative.  Negative for cough, hemoptysis and shortness of breath.   Cardiovascular: Negative.  Negative for chest pain and leg swelling.  Gastrointestinal: Negative.  Negative for abdominal pain.  Genitourinary: Negative.   Musculoskeletal: Negative.  Negative for back pain.  Skin: Negative.  Negative for rash.  Neurological: Negative.  Negative for dizziness, focal weakness, seizures, weakness and headaches.  Endo/Heme/Allergies:  Does not bruise/bleed easily.  Psychiatric/Behavioral: Negative.  The patient is not nervous/anxious.     As per HPI. Otherwise, a complete review of systems is negative.  PAST MEDICAL HISTORY: Past Medical History:  Diagnosis Date   Acute hemorrhoid 03/11/2015   Anxiety    Arthritis    Asthma    Cancer (HCC)     Chronic back pain    CLL (chronic lymphocytic leukemia) (HCC)    Depression    Diabetes mellitus without complication (HCC)    Genital herpes    type 2   Hypertension    Hypothyroidism    Vertigo     PAST SURGICAL HISTORY: Past Surgical History:  Procedure Laterality Date   ABDOMINAL HYSTERECTOMY     BILATERAL SALPINGOOPHORECTOMY  2009   BREAST BIOPSY Right 05/17/2016   FIBROADENOMATOUS CHANGE AND SCLEROSING ADENOSIS WITH   COLONOSCOPY WITH PROPOFOL N/A 06/16/2015   Procedure: COLONOSCOPY WITH PROPOFOL;  Surgeon: Elnita Maxwell, MD;  Location: [facility] ENDOSCOPY;  Service: Endoscopy;  Laterality: N/A;   DIALYSIS/PERMA CATHETER INSERTION N/A 12/17/2020   Procedure: DIALYSIS/PERMA CATHETER INSERTION;  Surgeon: Annice Needy, MD;  Location: [facility] INVASIVE CV LAB;  Service: Cardiovascular;  Laterality: N/A;   LAPAROSCOPIC SUPRACERVICAL HYSTERECTOMY  2009   due to leio   OOPHORECTOMY     TEMPORARY DIALYSIS CATHETER N/A 12/10/2020   Procedure: TEMPORARY DIALYSIS CATHETER;  Surgeon: Renford Dills, MD;  Location: [facility] INVASIVE CV LAB;  Service: Cardiovascular;  Laterality: N/A;   THORACIC LAMINECTOMY FOR EPIDURAL ABSCESS Bilateral 06/17/2020   Procedure: THORACIC LAMINECTOMY FOR EPIDURAL ABSCESS;  Surgeon: Lucy Chris, MD;  Location: [facility] ORS;  Service: Neurosurgery;  Laterality: Bilateral;    FAMILY HISTORY: Family History  Problem Relation Age of Onset   Cancer Paternal Aunt    Breast cancer Maternal Aunt 49    ADVANCED DIRECTIVES (Y/N):  @ADVDIR @  HEALTH MAINTENANCE: Social History   Tobacco Use   Smoking status: Never   Smokeless tobacco: Never  Vaping Use   Vaping status: Never Used  Substance Use Topics  Alcohol use: Not Currently    Alcohol/week: 1.0 standard drink of alcohol    Types: 1 Cans of beer per week   Drug use: No     Colonoscopy:  PAP:  Bone density:  Lipid panel:  Allergies  Allergen Reactions   Amoxapine     Other Reaction(s): Unknown    Latex    Ceftazidime     Other reaction(s): Confusion, Hallucination, Unknown Encephalopathy - improved after dialysis/some concern for cephalosporin neurotoxicity Tolerated without confusion 06/2021. Encephalopathy - improved after dialysis/some concern for cephalosporin neurotoxicity Tolerated without confusion 06/2021.    Iodinated Contrast Media Hives    Other Reaction(s): Hives   Penicillins     Other Reaction(s): Unknown    Current Facility-Administered Medications  Medication Dose Route Frequency Provider Last Rate Last Admin   acetaminophen (TYLENOL) tablet 650 mg  650 mg Oral Q6H PRN Mansy, Jan A, MD       Or   acetaminophen (TYLENOL) suppository 650 mg  650 mg Rectal Q6H PRN Mansy, Jan A, MD       albuterol (PROVENTIL) (2.5 MG/3ML) 0.083% nebulizer solution 2.5 mg  2.5 mg Nebulization Q6H PRN Otelia Sergeant, RPH       ascorbic acid (VITAMIN C) tablet 500 mg  500 mg Oral BID Mansy, Jan A, MD   500 mg at 08/02/23 0917   atorvastatin (LIPITOR) tablet 20 mg  20 mg Oral Daily Mansy, Jan A, MD   20 mg at 08/02/23 0916   calcium acetate (PHOSLO) capsule 1,334 mg  1,334 mg Oral TID WC Mansy, Jan A, MD   1,334 mg at 08/02/23 1201   Chlorhexidine Gluconate Cloth 2 % PADS 6 each  6 each Topical Q0600 Lorain Childes, MD   6 each at 08/02/23 0609   fluticasone (FLONASE) 50 MCG/ACT nasal spray 2 spray  2 spray Each Nare Daily Mansy, Jan A, MD   2 spray at 08/02/23 4270   folic acid (FOLVITE) tablet 1 mg  1 mg Oral Daily Mansy, Jan A, MD   1 mg at 08/02/23 0916   gabapentin (NEURONTIN) capsule 100 mg  100 mg Oral BID Charise Killian, MD   100 mg at 08/02/23 6237   hydrOXYzine (ATARAX) tablet 25 mg  25 mg Oral TID PRN Mansy, Jan A, MD       levothyroxine (SYNTHROID) tablet 125 mcg  125 mcg Oral Q0600 Mansy, Jan A, MD   125 mcg at 08/02/23 6283   magnesium hydroxide (MILK OF MAGNESIA) suspension 30 mL  30 mL Oral Daily PRN Mansy, Jan A, MD       midodrine (PROAMATINE) tablet 5 mg  5 mg  Oral Once per day on Monday Wednesday Friday Lorain Childes, MD   5 mg at 08/01/23 1517   morphine (PF) 2 MG/ML injection 1 mg  1 mg Intravenous Q4H PRN Charise Killian, MD   1 mg at 07/31/23 2014   multivitamin (RENA-VIT) tablet 1 tablet  1 tablet Oral QHS Mansy, Jan A, MD   1 tablet at 08/01/23 2220   ondansetron (ZOFRAN) tablet 4 mg  4 mg Oral Q6H PRN Mansy, Jan A, MD       Or   ondansetron [facility]) injection 4 mg  4 mg Intravenous Q6H PRN Mansy, Jan A, MD   4 mg at 07/30/23 0953   oxyCODONE-acetaminophen (PERCOCET/ROXICET) 5-325 MG per tablet 1 tablet  1 tablet Oral Q6H PRN Charise Killian, MD   1 tablet at 08/01/23  1653   pantoprazole (PROTONIX) EC tablet 40 mg  40 mg Oral Daily Mansy, Jan A, MD   40 mg at 08/02/23 4098   senna-docusate (Senokot-S) tablet 2 tablet  2 tablet Oral QHS Mansy, Jan A, MD       traZODone (DESYREL) tablet 25 mg  25 mg Oral QHS PRN Mansy, Jan A, MD       valACYclovir (VALTREX) tablet 500 mg  500 mg Oral Daily Charise Killian, MD   500 mg at 08/02/23 1191   zinc sulfate (50mg  elemental zinc) capsule 220 mg  220 mg Oral Daily Mansy, Jan A, MD   220 mg at 08/02/23 0917    OBJECTIVE: Vitals:   08/02/23 0414 08/02/23 0817  BP: (!) 94/57 100/79  Pulse: 88 69  Resp: 18   Temp: 99.2 F (37.3 C) 99.5 F (37.5 C)  SpO2: 98% 95%     Body mass index is 26.28 kg/m.    ECOG FS:1 - Symptomatic but completely ambulatory  General: Well-developed, well-nourished, no acute distress. Eyes: Pink conjunctiva, anicteric sclera. HEENT: Normocephalic, moist mucous membranes. Lungs: No audible wheezing or coughing. Heart: Regular rate and rhythm. Abdomen: Soft, nontender, no obvious distention. Musculoskeletal: No edema, cyanosis, or clubbing. Neuro: Alert, answering all questions appropriately. Cranial nerves grossly intact. Skin: No rashes or petechiae noted. Psych: Normal affect. Lymphatics: No cervical, calvicular, axillary or inguinal LAD.   LAB  RESULTS:  Lab Results  Component Value Date   NA 133 (L) 08/02/2023   K 4.3 08/02/2023   CL 93 (L) 08/02/2023   CO2 27 08/02/2023   GLUCOSE 94 08/02/2023   BUN 43 (H) 08/02/2023   CREATININE 6.96 (H) 08/02/2023   CALCIUM 8.7 (L) 08/02/2023   PROT 7.9 07/29/2023   ALBUMIN 4.0 07/29/2023   AST 36 07/29/2023   ALT 21 07/29/2023   ALKPHOS 63 07/29/2023   BILITOT 1.4 (H) 07/29/2023   GFRNONAA 6 (L) 08/02/2023   GFRAA >60 01/17/2020    Lab Results  Component Value Date   WBC 2.6 (L) 08/02/2023   NEUTROABS 2.3 07/18/2023   HGB 12.1 08/02/2023   HCT 37.9 08/02/2023   MCV 92.9 08/02/2023   PLT 25 (LL) 08/02/2023     STUDIES: DG Chest 2 View Result Date: 07/29/2023 CLINICAL DATA:  Weakness skip EXAM: CHEST - 2 VIEW COMPARISON:  Chest x-ray 12/12/2021 FINDINGS: The heart size and mediastinal contours are within normal limits. Both lungs are clear. The visualized skeletal structures are unremarkable. IMPRESSION: No active cardiopulmonary disease. Electronically Signed   By: Darliss Cheney M.D.   On: 07/29/2023 20:34   MR BRAIN WO CONTRAST Result Date: 07/25/2023 CLINICAL DATA:  58 year old female with syncope, neurologic deficit, diarrhea. EXAM: MRI HEAD WITHOUT CONTRAST TECHNIQUE: Multiplanar, multiecho pulse sequences of the brain and surrounding structures were obtained without intravenous contrast. COMPARISON:  Brain MRI 03/29/2023 and earlier. FINDINGS: Brain: Overall cerebral volume remains normal for age. No restricted diffusion to suggest acute infarction. No midline shift, mass effect, evidence of mass lesion, ventriculomegaly, extra-axial collection or acute intracranial hemorrhage. Cervicomedullary junction and pituitary are within normal limits. No cortical encephalomalacia or chronic cerebral blood products identified. Patchy moderate for age and nonspecific cerebral white matter T2 and FLAIR hyperintensity is stable. Deep gray nuclei, brainstem and cerebellum remain within  normal limits. Vascular: Major intracranial vascular flow voids are preserved, stable since December. Skull and upper cervical spine: Partially visible cervical spine degeneration. Background bone marrow signal remains within normal limits. Sinuses/Orbits: Stable,  negative. Other: Grossly normal visible internal auditory structures. Negative visible scalp and face. IMPRESSION: 1. No acute intracranial abnormality. 2. Stable moderate for age nonspecific cerebral white matter signal changes, most commonly due to small vessel disease. Electronically Signed   By: Odessa Fleming M.D.   On: 07/25/2023 09:03   CT ABDOMEN PELVIS WO CONTRAST Result Date: 07/25/2023 CLINICAL DATA:  Acute, nonlocalized abdominal pain.  Diarrhea EXAM: CT ABDOMEN AND PELVIS WITHOUT CONTRAST TECHNIQUE: Multidetector CT imaging of the abdomen and pelvis was performed following the standard protocol without IV contrast. RADIATION DOSE REDUCTION: This exam was performed according to the departmental dose-optimization program which includes automated exposure control, adjustment of the mA and/or kV according to patient size and/or use of iterative reconstruction technique. COMPARISON:  12/12/2021 FINDINGS: Lower chest:  Coronary atherosclerosis. Hepatobiliary: Low-density lesions in the subcapsular right lobe liver or subtle compared to 07/13/2021 comparison with no detected growth compatible with benign process.Physiologic distension of the gallbladder with layering gallstone. Pancreas: Scattered coarse calcifications attributed to old pancreatitis, no interval change. Spleen: Unremarkable. Adrenals/Urinary Tract: Negative adrenals. No hydronephrosis or stone. 15 mm cyst in the interpolar left kidney. Fairly symmetric renal atrophy with right kidney measuring up to 6 cm in length. Unremarkable bladder. Stomach/Bowel:  No obstruction. No appendicitis. Vascular/Lymphatic: No acute vascular abnormality. No mass or adenopathy. Reproductive:Hysterectomy.  Other: No ascites or pneumoperitoneum. Musculoskeletal: No acute abnormalities. IMPRESSION: 1. No acute finding. 2. Cholelithiasis, chronic pancreatitis, and renal atrophy. Electronically Signed   By: Tiburcio Pea M.D.   On: 07/25/2023 05:01    ASSESSMENT: History of CLL, now with significant thrombocytopenia.  PLAN:    History of CLL: Patient's most recent imaging with CT of abdomen and pelvis on July 25, 2023 revealed no evidence of lymphadenopathy.  Previously, patient had no lymphadenopathy in her chest either. She last received treatment with Rituxan and Treanda on April 09, 2015.  Patient now has a decreased total white blood cell count and recent flow cytometry was inconclusive.  Unclear if her new onset thrombocytopenia is related to CLL.  Have ordered a bone marrow biopsy for tomorrow to further evaluate. Thrombocytopenia: Patient's current platelet count is 25.  Recently in December 2024 patient was noted to have a mild thrombocytopenia, but still 122.  She then had a significant drop and was found to have a platelet count of 10 on July 15, 2023.  Initial workup as outpatient did not reveal distinct etiology.  Plan for bone marrow biopsy for tomorrow to further evaluate. End-stage renal disease: Continue dialysis as scheduled.  Patient reports her next session is tomorrow morning. Disposition: Okay from oncology standpoint to discharge patient after bone marrow biopsy tomorrow.  Have asked if interventional radiology can complete bone marrow biopsy after her dialysis session in the morning.  If biopsy cannot be completed tomorrow, recommend discharge and will arrange to have bone marrow biopsy completed as outpatient.  Will arrange follow-up in 1 week for laboratory work and discussion of her biopsy results.  Appreciate consult, will follow.  Jeralyn Ruths, MD   08/02/2023 4:51 PM

## 2023-08-03 ENCOUNTER — Encounter: Payer: Self-pay | Admitting: Family Medicine

## 2023-08-03 ENCOUNTER — Observation Stay

## 2023-08-03 DIAGNOSIS — R55 Syncope and collapse: Secondary | ICD-10-CM | POA: Diagnosis not present

## 2023-08-03 LAB — CBC
HCT: 35.6 % — ABNORMAL LOW (ref 36.0–46.0)
Hemoglobin: 11.4 g/dL — ABNORMAL LOW (ref 12.0–15.0)
MCH: 29.5 pg (ref 26.0–34.0)
MCHC: 32 g/dL (ref 30.0–36.0)
MCV: 92.2 fL (ref 80.0–100.0)
Platelets: 29 10*3/uL — CL (ref 150–400)
RBC: 3.86 MIL/uL — ABNORMAL LOW (ref 3.87–5.11)
RDW: 20.8 % — ABNORMAL HIGH (ref 11.5–15.5)
WBC: 2.7 10*3/uL — ABNORMAL LOW (ref 4.0–10.5)
nRBC: 0 % (ref 0.0–0.2)

## 2023-08-03 LAB — BASIC METABOLIC PANEL WITH GFR
Anion gap: 11 (ref 5–15)
BUN: 64 mg/dL — ABNORMAL HIGH (ref 6–20)
CO2: 27 mmol/L (ref 22–32)
Calcium: 8.5 mg/dL — ABNORMAL LOW (ref 8.9–10.3)
Chloride: 96 mmol/L — ABNORMAL LOW (ref 98–111)
Creatinine, Ser: 9.16 mg/dL — ABNORMAL HIGH (ref 0.44–1.00)
GFR, Estimated: 5 mL/min — ABNORMAL LOW (ref >60)
Glucose, Bld: 113 mg/dL — ABNORMAL HIGH (ref 70–99)
Potassium: 4.8 mmol/L (ref 3.5–5.1)
Sodium: 134 mmol/L — ABNORMAL LOW (ref 135–145)

## 2023-08-03 LAB — HEPATITIS B SURFACE ANTIBODY, QUANTITATIVE: Hep B S AB Quant (Post): 9.8 m[IU]/mL — ABNORMAL LOW

## 2023-08-03 MED ORDER — ONDANSETRON HCL 4 MG/2ML IJ SOLN
INTRAMUSCULAR | Status: AC
  Filled 2023-08-03: qty 2

## 2023-08-03 MED ORDER — HEPARIN SODIUM (PORCINE) 1000 UNIT/ML DIALYSIS: 1000.0000 [IU] | INTRAMUSCULAR | Status: DC | PRN

## 2023-08-03 MED ORDER — LIDOCAINE HCL 1 % IJ SOLN
10.0000 mL | Freq: Once | INTRAMUSCULAR | Status: AC
  Filled 2023-08-03: qty 10

## 2023-08-03 MED ORDER — FENTANYL CITRATE (PF) 100 MCG/2ML IJ SOLN
INTRAMUSCULAR | Status: AC
Start: 2023-08-03 — End: ?
  Filled 2023-08-03: qty 2

## 2023-08-03 MED ORDER — MIDAZOLAM HCL 2 MG/2ML IJ SOLN
INTRAMUSCULAR | Status: AC | PRN
  Administered 2023-08-03: 1 mg via INTRAVENOUS

## 2023-08-03 MED ORDER — LIDOCAINE-PRILOCAINE 2.5-2.5 % EX CREA: 1.0000 | TOPICAL_CREAM | CUTANEOUS | Status: DC | PRN

## 2023-08-03 MED ORDER — PENTAFLUOROPROP-TETRAFLUOROETH EX AERO: 1.0000 | INHALATION_SPRAY | CUTANEOUS | Status: DC | PRN

## 2023-08-03 MED ORDER — FENTANYL CITRATE (PF) 100 MCG/2ML IJ SOLN
INTRAMUSCULAR | Status: AC
  Filled 2023-08-03: qty 2

## 2023-08-03 MED ORDER — MIDAZOLAM HCL 2 MG/2ML IJ SOLN
INTRAMUSCULAR | Status: AC
  Filled 2023-08-03: qty 4

## 2023-08-03 MED ORDER — HEPARIN SOD (PORK) LOCK FLUSH 100 UNIT/ML IV SOLN
INTRAVENOUS | Status: AC
  Filled 2023-08-03: qty 5

## 2023-08-03 MED ORDER — MIDODRINE HCL 2.5 MG PO TABS: 2.5000 mg | ORAL_TABLET | ORAL | 1 refills | Status: DC

## 2023-08-03 MED ORDER — VALACYCLOVIR HCL 500 MG PO TABS
500.0000 mg | ORAL_TABLET | Freq: Every day | ORAL | 0 refills | Status: AC
Start: 2023-08-03 — End: 2023-08-08

## 2023-08-03 MED ORDER — FENTANYL CITRATE (PF) 100 MCG/2ML IJ SOLN
INTRAMUSCULAR | Status: AC | PRN
  Administered 2023-08-03: 50 ug via INTRAVENOUS

## 2023-08-03 MED ORDER — ONDANSETRON 4 MG PO TBDP: 4.0000 mg | ORAL_TABLET | Freq: Three times a day (TID) | ORAL | 1 refills | Status: DC | PRN

## 2023-08-03 NOTE — Discharge Summary (Signed)
 Tonya Myers NWG:[id number] DOB: May 29, 1965 DOA: 07/29/2023  PCP: [name], MD  Admit date: 07/29/2023 Discharge date: 08/03/2023  Time spent: 35 minutes  Recommendations for Outpatient Follow-up:  Oncology f/u 1 week Pcp f/u as well     Discharge Diagnoses:  Principal Problem:   Near syncope Active Problems:   Essential hypertension   ESRD (end stage renal disease) (HCC)   Dyslipidemia   GERD without esophagitis   Peripheral neuropathy   Discharge Condition: stable  Diet recommendation: low sodium   Filed Weights   08/01/23 1155 08/01/23 1600 08/03/23 0747  Weight: 88.9 kg 87.9 kg 87.9 kg    History of present illness:  From admission h and p Tonya Myers is a 58 y.o. African-American female with medical history significant for end-stage renal disease on hemodialysis, osteoarthritis, asthma, anxiety, hypertension and hypothyroidism as well as type 2 diabetes mellitus, who presented to the emergency room with acute onset of generalized weakness and presyncope with associated dyspnea and nausea without vomiting.  She denied any chest pain or palpitations.  She had diarrhea last week and was diagnosed with pancreatitis and her symptoms resolved.  No fever or chills.  She has been having dysuria without hematuria or urgency or frequency or flank pain.  She did not miss any sessions of hemodialysis.    Hospital Course:  Patient presents with presyncope. Found to have worsening thrombocytopenia and leukopenia. She has a history of CLL. Recent flow cytometry inconclusive. Oncology advises bone marrow biopsy which was performed on 4/9. She was maintained on maintenance hemodialysis. Home BP meds held given soft BPs which are likely the cause of her presyncope. Patient feels ready for discharge and requests discharge. Oncology says safe for discharge, will arrange follow up in one week. Home health orders placed per PT recs (PT/OT/aide/walker/3-in-1).  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   Procedures: Maintenance hemodialysis, bone marrow biopsy (left iliac)  Consultations: Nephrology, oncology, IR  Discharge Exam: Vitals:   08/03/23 1057 08/03/23 1111  BP:  138/87  Pulse:  63  Resp: 15 18  Temp:  (!) 97.5 F (36.4 C)  SpO2:  97%    General: NAD Cardiovascular: RRR, soft systolic murmur Respiratory: CTAB  Discharge Instructions   Discharge Instructions     Diet - low sodium heart healthy   Complete by: As directed    Increase activity slowly   Complete by: As directed    No dressing needed   Complete by: As directed       Allergies as of 08/03/2023       Reactions   Amoxapine    Other Reaction(s): Unknown   Latex    Ceftazidime    Other reaction(s): Confusion, Hallucination, Unknown Encephalopathy - improved after dialysis/some concern for cephalosporin neurotoxicity Tolerated without confusion 06/2021. Encephalopathy - improved after dialysis/some concern for cephalosporin neurotoxicity Tolerated without confusion 06/2021.   Iodinated Contrast Media Hives   Other Reaction(s): Hives   Penicillins    Other Reaction(s): Unknown        Medication List     PAUSE taking these medications    aspirin EC 81 MG tablet Wait to take this until your doctor or other care provider tells you to start again. Take 1 tablet (81 mg total) by mouth daily. Swallow whole.   furosemide 80 MG tablet Wait to take this until your doctor or other care provider tells you to start  again. Commonly known as: LASIX Take 1 tablet by mouth daily.   propranolol 20 MG tablet Wait to take this until your doctor or other care provider tells you to start again. Commonly known as: INDERAL Take 20 mg by mouth 3 (three) times daily.   telmisartan 80 MG tablet Wait to take this until your doctor or other care provider tells you to start again. Commonly known as:  MICARDIS Take 80 mg by mouth daily.       STOP taking these medications    hydrochlorothiazide 12.5 MG tablet Commonly known as: HYDRODIURIL   senna-docusate 8.6-50 MG tablet Commonly known as: Senokot-S       TAKE these medications    acetaminophen 325 MG tablet Commonly known as: TYLENOL Take 2 tablets (650 mg total) by mouth every 6 (six) hours as needed for mild pain (or Fever >/= 101).   albuterol 108 (90 Base) MCG/ACT inhaler Commonly known as: VENTOLIN HFA Inhale 2 puffs into the lungs every 6 (six) hours as needed for wheezing or shortness of breath.   amitriptyline 10 MG tablet Commonly known as: ELAVIL Take 10-20 mg by mouth at bedtime as needed (neuropathy pain).   ascorbic acid 500 MG tablet Commonly known as: VITAMIN C Take 1 tablet (500 mg total) by mouth 2 (two) times daily.   atorvastatin 20 MG tablet Commonly known as: LIPITOR Take 20 mg by mouth daily.   calcium acetate 667 MG capsule Commonly known as: PHOSLO Take 1,334 mg by mouth 3 (three) times daily with meals.   cetirizine 10 MG tablet Commonly known as: ZYRTEC Take 1 tablet by mouth daily.   Dexcom G6 Sensor Misc SMARTSIG:1 Each Topical Every 10 Days   fluticasone 50 MCG/ACT nasal spray Commonly known as: FLONASE Place 2 sprays into both nostrils daily.   folic acid 1 MG tablet Commonly known as: FOLVITE Take 1 tablet by mouth daily.   gabapentin 300 MG capsule Commonly known as: NEURONTIN TAKE 1 CAPSULE BY MOUTH THREE TIMES A DAY   hydrOXYzine 25 MG tablet Commonly known as: ATARAX Take 1 tablet (25 mg total) by mouth 3 (three) times daily as needed.   lanthanum 1000 MG chewable tablet Commonly known as: FOSRENOL Chew 1,000 mg by mouth 3 (three) times daily.   levothyroxine 125 MCG tablet Commonly known as: SYNTHROID Take 125 mcg by mouth daily.   Lokelma 10 g Pack packet Generic drug: sodium zirconium cyclosilicate Take 10 g by mouth once a week.   midodrine 2.5  MG tablet Commonly known as: PROAMATINE Take 1 tablet (2.5 mg total) by mouth 3 (three) times a week. Take one tablet 15-30 minutes before dialysis   multivitamin Tabs tablet Take 1 tablet by mouth at bedtime.   ondansetron 4 MG disintegrating tablet Commonly known as: ZOFRAN-ODT Take 1 tablet (4 mg total) by mouth every 8 (eight) hours as needed for nausea or vomiting.   pantoprazole 40 MG tablet Commonly known as: Protonix Take 1 tablet (40 mg total) by mouth daily.   valACYclovir 500 MG tablet Commonly known as: VALTREX Take 1 tablet (500 mg total) by mouth daily for 5 days. What changed:  medication strength how much to take when to take this   zinc sulfate (50mg  elemental zinc) 220 (50 Zn) MG capsule Take 1 capsule (220 mg total) by mouth daily.               Durable Medical Equipment  (From admission, onward)  Start     Ordered   08/03/23 1118  DME 3-in-1  Once        08/03/23 1117   08/03/23 1118  DME Walker  Once       Question Answer Comment  Walker: With 5 Inch Wheels   Patient needs a walker to treat with the following condition ESRD (end stage renal disease) (HCC)      08/03/23 1117   07/31/23 1044  For home use only DME Walker rolling  Once       Question Answer Comment  Walker: With 5 Inch Wheels   Patient needs a walker to treat with the following condition Generalized weakness      07/31/23 1043              Discharge Care Instructions  (From admission, onward)           Start     Ordered   08/03/23 0000  No dressing needed        08/03/23 1117           Allergies  Allergen Reactions   Amoxapine     Other Reaction(s): Unknown   Latex    Ceftazidime     Other reaction(s): Confusion, Hallucination, Unknown Encephalopathy - improved after dialysis/some concern for cephalosporin neurotoxicity Tolerated without confusion 06/2021. Encephalopathy - improved after dialysis/some concern for cephalosporin  neurotoxicity Tolerated without confusion 06/2021.    Iodinated Contrast Media Hives    Other Reaction(s): Hives   Penicillins     Other Reaction(s): Unknown    Follow-up Information     Jeralyn Ruths, MD Follow up.   Specialty: Oncology Why: call to schedule follow up in 1 week Contact information: [address] [place] [address] [phone]         [name], MD Follow up.   Specialty: Family Medicine Contact information: [address] [facility] [place] [address] [phone]                  The results of significant diagnostics from this hospitalization (including imaging, microbiology, ancillary and laboratory) are listed below for reference.    Significant Diagnostic Studies: DG Chest 2 View Result Date: 07/29/2023 CLINICAL DATA:  Weakness skip EXAM: CHEST - 2 VIEW COMPARISON:  Chest x-ray 12/12/2021 FINDINGS: The heart size and mediastinal contours are within normal limits. Both lungs are clear. The visualized skeletal structures are unremarkable. IMPRESSION: No active cardiopulmonary disease. Electronically Signed   By: Darliss Cheney M.D.   On: 07/29/2023 20:34   MR BRAIN WO CONTRAST Result Date: 07/25/2023 CLINICAL DATA:  57 year old female with syncope, neurologic deficit, diarrhea. EXAM: MRI HEAD WITHOUT CONTRAST TECHNIQUE: Multiplanar, multiecho pulse sequences of the brain and surrounding structures were obtained without intravenous contrast. COMPARISON:  Brain MRI 03/29/2023 and earlier. FINDINGS: Brain: Overall cerebral volume remains normal for age. No restricted diffusion to suggest acute infarction. No midline shift, mass effect, evidence of mass lesion, ventriculomegaly, extra-axial collection or acute intracranial hemorrhage. Cervicomedullary junction and pituitary are within normal limits. No cortical encephalomalacia or chronic cerebral blood products identified. Patchy moderate for age and nonspecific cerebral  white matter T2 and FLAIR hyperintensity is stable. Deep gray nuclei, brainstem and cerebellum remain within normal limits. Vascular: Major intracranial vascular flow voids are preserved, stable since December. Skull and upper cervical spine: Partially visible cervical spine degeneration. Background bone marrow signal remains within normal limits. Sinuses/Orbits: Stable, negative. Other: Grossly normal visible internal auditory structures.  Negative visible scalp and face. IMPRESSION: 1. No acute intracranial abnormality. 2. Stable moderate for age nonspecific cerebral white matter signal changes, most commonly due to small vessel disease. Electronically Signed   By: Odessa Fleming M.D.   On: 07/25/2023 09:03   CT ABDOMEN PELVIS WO CONTRAST Result Date: 07/25/2023 CLINICAL DATA:  Acute, nonlocalized abdominal pain.  Diarrhea EXAM: CT ABDOMEN AND PELVIS WITHOUT CONTRAST TECHNIQUE: Multidetector CT imaging of the abdomen and pelvis was performed following the standard protocol without IV contrast. RADIATION DOSE REDUCTION: This exam was performed according to the departmental dose-optimization program which includes automated exposure control, adjustment of the mA and/or kV according to patient size and/or use of iterative reconstruction technique. COMPARISON:  12/12/2021 FINDINGS: Lower chest:  Coronary atherosclerosis. Hepatobiliary: Low-density lesions in the subcapsular right lobe liver or subtle compared to 07/13/2021 comparison with no detected growth compatible with benign process.Physiologic distension of the gallbladder with layering gallstone. Pancreas: Scattered coarse calcifications attributed to old pancreatitis, no interval change. Spleen: Unremarkable. Adrenals/Urinary Tract: Negative adrenals. No hydronephrosis or stone. 15 mm cyst in the interpolar left kidney. Fairly symmetric renal atrophy with right kidney measuring up to 6 cm in length. Unremarkable bladder. Stomach/Bowel:  No obstruction. No  appendicitis. Vascular/Lymphatic: No acute vascular abnormality. No mass or adenopathy. Reproductive:Hysterectomy. Other: No ascites or pneumoperitoneum. Musculoskeletal: No acute abnormalities. IMPRESSION: 1. No acute finding. 2. Cholelithiasis, chronic pancreatitis, and renal atrophy. Electronically Signed   By: Tiburcio Pea M.D.   On: 07/25/2023 05:01    Microbiology: Recent Results (from the past 240 hours)  SARS Coronavirus 2 by RT PCR (hospital order, performed in [facility] hospital lab) *cepheid single result test* Anterior Nasal Swab     Status: None   Collection Time: 07/25/23  1:30 AM   Specimen: Anterior Nasal Swab  Result Value Ref Range Status   SARS Coronavirus 2 by RT PCR NEGATIVE NEGATIVE Final    Comment: Performed at [facility], [address] [place], [place] [address]     Labs: Basic Metabolic Panel: Recent Labs  Lab 07/30/23 0716 07/31/23 1401 08/01/23 0708 08/02/23 0550 08/03/23 0531  NA 136 137 138 133* 134*  K 3.6 4.1 5.1 4.3 4.8  CL 97* 96* 99 93* 96*  CO2 24 24 20* 27 27  GLUCOSE 93 126* 82 94 113*  BUN 33* 62* 75* 43* 64*  CREATININE 6.12* 8.79* 10.42* 6.96* 9.16*  CALCIUM 9.2 8.9 8.7* 8.7* 8.5*   Liver Function Tests: Recent Labs  Lab 07/29/23 1930  AST 36  ALT 21  ALKPHOS 63  BILITOT 1.4*  PROT 7.9  ALBUMIN 4.0   Recent Labs  Lab 07/29/23 1930  LIPASE 73*   No results for input(s): "AMMONIA" in the last 168 hours. CBC: Recent Labs  Lab 07/30/23 0716 07/31/23 1401 08/01/23 0708 08/02/23 0550 08/03/23 0531  WBC 2.1* 3.0* 3.0* 2.6* 2.7*  HGB 12.6 12.0 14.4 12.1 11.4*  HCT 39.7 38.7 44.4 37.9 35.6*  MCV 94.7 95.8 91.2 92.9 92.2  PLT 31* 36* 24* 25* 29*   Cardiac Enzymes: No results for input(s): "CKTOTAL", "CKMB", "CKMBINDEX", "TROPONINI" in the last 168 hours. BNP: BNP (last 3 results) No results for input(s): "BNP" in the last 8760 hours.  ProBNP (last 3 results) No results for input(s): "PROBNP" in the  last 8760 hours.  CBG: Recent Labs  Lab 08/01/23 1635 08/01/23 1720 08/01/23 2212 08/02/23 0919 08/02/23 1714  GLUCAP 63* 75 96 117* 100*  Signed:  Silvano Bilis MD.  [facility] 08/03/2023, 11:18 AM

## 2023-08-03 NOTE — Progress Notes (Signed)
 Patient is not able to walk the distance required to go the bathroom, or he/she is unable to safely negotiate stairs required to access the bathroom.  A 3in1 BSC will alleviate this problem

## 2023-08-03 NOTE — Procedures (Signed)
 Interventional Radiology Procedure Note  Procedure: CT guided aspirate and core biopsy of left iliac bone Complications: None Recommendations: - Bedrest supine x 1 hrs - Hydrocodone PRN  Pain - Follow biopsy results  Signed,  Sterling Big, MD

## 2023-08-03 NOTE — Progress Notes (Signed)
  Received patient in bed to unit.   Informed consent signed and in chart.    TX duration: 3:14 pt rinsed back 16 min early d/t pt becoming hypotensive, staring and not able to answer questions.  Rinse back plus 200cc n/s given. Pt back to baseline within 10 min. CN at bedside .     Transported back to foor and pts primary nurse made aware of above  Hand-off given to patient's nurse. No c/o and no acute distress noted    Access used: R AVG Access issues: none   Total UF removed: 1.4L Medication(s) given: none Post HD VS: WNL Post HD weight: 87.8kg     Lynann Beaver LPN [facility]

## 2023-08-03 NOTE — TOC Transition Note (Addendum)
 Transition of Care [facility]) - Discharge Note   Patient Details  Name: Tonya Myers MRN: [id number] Date of Birth: 17-Mar-1966  Transition of [facility]) [name] Contact:  Truddie Hidden, RN Phone Number: 08/03/2023, 4:19 PM   Clinical Narrative:   Patient off the floor for dialysis   Per MD patient will need transportation  home.  Taxi voucher prepared and sent to the floor.   Patient [facility] referral sent and accepted by Adelina Mings from [facility]. Patient RW and [facility] request sent to Jon from [name].   TOC signing off.           Patient Goals and CMS Choice            Discharge Placement                       Discharge Plan and Services Additional resources added to the After Visit Summary for                                       Social Drivers of Health (SDOH) Interventions SDOH Screenings   Food Insecurity: No Food Insecurity (07/30/2023)  Recent Concern: Food Insecurity - Food Insecurity Present (07/06/2023)   Received from [facility]  Housing: Low Risk  (07/30/2023)  Recent Concern: Housing - High Risk (07/06/2023)   Received from [facility]  Transportation Needs: No Transportation Needs (07/30/2023)  Recent Concern: Transportation Needs - Unmet Transportation Needs (07/06/2023)   Received from [facility]  Utilities: Not At Risk (07/30/2023)  Depression (PHQ2-9): Low Risk  (10/29/2020)  Financial Resource Strain: High Risk (07/06/2023)   Received from [facility]  Tobacco Use: Low Risk  (08/03/2023)     Readmission Risk Interventions    12/24/2021   12:03 PM 11/24/2020    2:17 PM  Readmission Risk Prevention Plan  Transportation Screening Complete Complete  PCP or Specialist Appt within 3-5 Days  Complete  HRI or Home Care Consult  Complete  Social Work Consult for Recovery Care Planning/Counseling  Complete  Palliative Care Screening  Not Applicable  Medication Review Furniture conservator/restorer) Complete Complete  PCP or Specialist appointment within 3-5 days of discharge Complete   HRI or Home Care Consult Complete   SW Recovery Care/Counseling Consult Complete   Palliative Care Screening Not Applicable   Skilled Nursing Facility Complete

## 2023-08-03 NOTE — Consult Note (Signed)
 Chief Complaint: Patient was seen in consultation today for thrombocytopenia and leukopenia at the request of Finnegan,Timothy J  Referring Physician(s): Finnegan,Timothy J  Supervising Physician: Malachy Moan  Patient Status: [facility] - In-pt  History of Present Illness: Tonya Myers is a 58 y.o. female with PMHx of CLL, DM, HTN, ESRD on HD via RUE AVG with plan for HD today. Patient's most recent imaging on 07/25/23 revealed no evidence of lymphadenopathy and her last CLL treatment was in 2016. She now has new onset thrombocytopenia and leukopenia, hematology has seen the patient and recent flow cytometry was inconclusive. Request received for bone marrow biopsy to further evaluate.   The patient denies any current chest pain or shortness of breath. The patient denies any history of sleep apnea or chronic oxygen use. She has no known complications to sedation. She has previously had bone marrow biopsies in the past and tolerated the procedure well.   Past Medical History:  Diagnosis Date   Acute hemorrhoid 03/11/2015   Anxiety    Arthritis    Asthma    Cancer (HCC)    Chronic back pain    CLL (chronic lymphocytic leukemia) (HCC)    Depression    Diabetes mellitus without complication (HCC)    Genital herpes    type 2   Hypertension    Hypothyroidism    Vertigo     Past Surgical History:  Procedure Laterality Date   ABDOMINAL HYSTERECTOMY     BILATERAL SALPINGOOPHORECTOMY  2009   BREAST BIOPSY Right 05/17/2016   FIBROADENOMATOUS CHANGE AND SCLEROSING ADENOSIS WITH   COLONOSCOPY WITH PROPOFOL N/A 06/16/2015   Procedure: COLONOSCOPY WITH PROPOFOL;  Surgeon: Elnita Maxwell, MD;  Location: [facility] ENDOSCOPY;  Service: Endoscopy;  Laterality: N/A;   DIALYSIS/PERMA CATHETER INSERTION N/A 12/17/2020   Procedure: DIALYSIS/PERMA CATHETER INSERTION;  Surgeon: Annice Needy, MD;  Location: [facility] INVASIVE CV LAB;  Service: Cardiovascular;  Laterality: N/A;    LAPAROSCOPIC SUPRACERVICAL HYSTERECTOMY  2009   due to leio   OOPHORECTOMY     TEMPORARY DIALYSIS CATHETER N/A 12/10/2020   Procedure: TEMPORARY DIALYSIS CATHETER;  Surgeon: Renford Dills, MD;  Location: [facility] INVASIVE CV LAB;  Service: Cardiovascular;  Laterality: N/A;   THORACIC LAMINECTOMY FOR EPIDURAL ABSCESS Bilateral 06/17/2020   Procedure: THORACIC LAMINECTOMY FOR EPIDURAL ABSCESS;  Surgeon: Lucy Chris, MD;  Location: [facility] ORS;  Service: Neurosurgery;  Laterality: Bilateral;    Allergies: Amoxapine, Latex, Ceftazidime, Iodinated contrast media, and Penicillins  Medications: Prior to Admission medications   Medication Sig Start Date End Date Taking? Authorizing Provider  acetaminophen (TYLENOL) 325 MG tablet Take 2 tablets (650 mg total) by mouth every 6 (six) hours as needed for mild pain (or Fever >/= 101). 07/30/20  Yes Angiulli, Mcarthur Rossetti, PA-C  ascorbic acid (VITAMIN C) 500 MG tablet Take 1 tablet (500 mg total) by mouth 2 (two) times daily. 12/22/21  Yes Sreenath, Sudheer B, MD  aspirin EC 81 MG tablet Take 1 tablet (81 mg total) by mouth daily. Swallow whole. 10/02/20  Yes Delfino Lovett, MD  atorvastatin (LIPITOR) 20 MG tablet Take 20 mg by mouth daily. 10/18/21  Yes [provider]  calcium acetate (PHOSLO) 667 MG capsule Take 1,334 mg by mouth 3 (three) times daily with meals. 05/11/23 05/10/24 Yes [provider]  cetirizine (ZYRTEC) 10 MG tablet Take 1 tablet by mouth daily. 07/07/23  Yes [provider]  fluticasone (FLONASE) 50 MCG/ACT nasal spray Place 2 sprays into both nostrils daily.  Yes [provider]  gabapentin (NEURONTIN) 300 MG capsule TAKE 1 CAPSULE BY MOUTH THREE TIMES A DAY 07/07/23  Yes Felecia Shelling, DPM  hydrOXYzine (ATARAX) 25 MG tablet Take 1 tablet (25 mg total) by mouth 3 (three) times daily as needed. 07/27/23  Yes Marcelino Duster, MD  lanthanum (FOSRENOL) 1000 MG chewable tablet Chew 1,000 mg by mouth 3 (three) times  daily.   Yes [provider]  levothyroxine (SYNTHROID) 125 MCG tablet Take 125 mcg by mouth daily. 12/08/21  Yes [provider]  LOKELMA 10 g PACK packet Take 10 g by mouth once a week.   Yes [provider]  midodrine (PROAMATINE) 2.5 MG tablet Take 2.5 mg by mouth 3 (three) times a week. Take one tablet 15-30 minutes before dialysis 06/14/23  Yes [provider]  multivitamin (RENA-VIT) TABS tablet Take 1 tablet by mouth at bedtime. 12/22/21  Yes Sreenath, Sudheer B, MD  ondansetron (ZOFRAN-ODT) 4 MG disintegrating tablet Take 1 tablet (4 mg total) by mouth every 8 (eight) hours as needed for nausea or vomiting. 07/27/23  Yes Marcelino Duster, MD  pantoprazole (PROTONIX) 40 MG tablet Take 1 tablet (40 mg total) by mouth daily. 07/27/23  Yes Marcelino Duster, MD  propranolol (INDERAL) 20 MG tablet Take 20 mg by mouth 3 (three) times daily. 03/17/23 03/16/24 Yes [provider]  valACYclovir (VALTREX) 1000 MG tablet Take 1 tablet (1,000 mg total) by mouth 3 (three) times daily. 07/25/23  Yes Irean Hong, MD  zinc sulfate 220 (50 Zn) MG capsule Take 1 capsule (220 mg total) by mouth daily. 12/23/21  Yes Sreenath, Sudheer B, MD  albuterol (VENTOLIN HFA) 108 (90 Base) MCG/ACT inhaler Inhale 2 puffs into the lungs every 6 (six) hours as needed for wheezing or shortness of breath. 10/18/21   [provider]  amitriptyline (ELAVIL) 10 MG tablet Take 10-20 mg by mouth at bedtime as needed (neuropathy pain).    [provider]  Continuous Blood Gluc Sensor (DEXCOM G6 SENSOR) MISC SMARTSIG:1 Each Topical Every 10 Days 08/12/20   [provider]  folic acid (FOLVITE) 1 MG tablet Take 1 tablet by mouth daily. 10/18/21   [provider]  furosemide (LASIX) 80 MG tablet Take 1 tablet by mouth daily. 07/20/23 08/23/24  [provider]  hydrochlorothiazide (HYDRODIURIL) 12.5 MG tablet Take 1 tablet by mouth daily. 03/17/23    [provider]  senna-docusate (SENOKOT-S) 8.6-50 MG tablet Take 2 tablets by mouth at bedtime. Patient not taking: Reported on 07/30/2023 12/22/21   Tresa Moore, MD  telmisartan (MICARDIS) 80 MG tablet Take 80 mg by mouth daily. 03/10/23 03/09/24  [provider]  budesonide-formoterol (SYMBICORT) 160-4.5 MCG/ACT inhaler Inhale into the lungs. 07/14/18 03/15/19  [provider]     Family History  Problem Relation Age of Onset   Cancer Paternal Aunt    Breast cancer Maternal Aunt 88    Social History   Socioeconomic History   Marital status: Married    Spouse name: Not on file   Number of children: Not on file   Years of education: Not on file   Highest education level: Not on file  Occupational History   Not on file  Tobacco Use   Smoking status: Never   Smokeless tobacco: Never  Vaping Use   Vaping status: Never Used  Substance and Sexual Activity   Alcohol use: Not Currently    Alcohol/week: 1.0 standard drink of alcohol  Types: 1 Cans of beer per week   Drug use: No   Sexual activity: Not Currently    Birth control/protection: None  Other Topics Concern   Not on file  Social History Narrative   Not on file   Social Drivers of Health   Financial Resource Strain: High Risk (07/06/2023)   Received from [facility]   Overall Financial Resource Strain (CARDIA)    Difficulty of Paying Living Expenses: Hard  Food Insecurity: No Food Insecurity (07/30/2023)   Hunger Vital Sign    Worried About Running Out of Food in the Last Year: Never true    Ran Out of Food in the Last Year: Never true  Recent Concern: Food Insecurity - Food Insecurity Present (07/06/2023)   Received from [facility]   Hunger Vital Sign    Worried About Running Out of Food in the Last Year: Sometimes true    Ran Out of Food in the Last Year: Sometimes true  Transportation Needs: No Transportation Needs (07/30/2023)   PRAPARE -  Administrator, Civil Service (Medical): No    Lack of Transportation (Non-Medical): No  Recent Concern: Transportation Needs - Unmet Transportation Needs (07/06/2023)   [facility]    In the past 12 months, has lack of transportation kept you from medical appointments or from getting medications?: Yes    Lack of Transportation (Non-Medical): Yes  Physical Activity: Not on file  Stress: Not on file  Social Connections: Not on file    Review of Systems: A 12 point ROS discussed and pertinent positives are indicated in the HPI above.  All other systems are negative.  Review of Systems  Vital Signs: BP 117/65   Pulse 79   Temp 98.3 F (36.8 C) (Oral)   Resp 18   Ht 6' (1.829 m)   Wt 193 lb 12.6 oz (87.9 kg)   SpO2 92%   BMI 26.28 kg/m   Physical Exam Constitutional:      General: She is not in acute distress. HENT:     Head: Normocephalic and atraumatic.  Cardiovascular:     Rate and Rhythm: Normal rate and regular rhythm.     Heart sounds: Murmur heard.  Pulmonary:     Effort: Pulmonary effort is normal. No respiratory distress.  Skin:    General: Skin is warm and dry.  Neurological:     Mental Status: She is alert and oriented to person, place, and time.    Imaging: DG Chest 2 View Result Date: 07/29/2023 CLINICAL DATA:  Weakness skip EXAM: CHEST - 2 VIEW COMPARISON:  Chest x-ray 12/12/2021 FINDINGS: The heart size and mediastinal contours are within normal limits. Both lungs are clear. The visualized skeletal structures are unremarkable. IMPRESSION: No active cardiopulmonary disease. Electronically Signed   By: Darliss Cheney M.D.   On: 07/29/2023 20:34   MR BRAIN WO CONTRAST Result Date: 07/25/2023 CLINICAL DATA:  58 year old female with syncope, neurologic deficit, diarrhea. EXAM: MRI HEAD WITHOUT CONTRAST TECHNIQUE: Multiplanar, multiecho pulse sequences of the brain and surrounding structures were  obtained without intravenous contrast. COMPARISON:  Brain MRI 03/29/2023 and earlier. FINDINGS: Brain: Overall cerebral volume remains normal for age. No restricted diffusion to suggest acute infarction. No midline shift, mass effect, evidence of mass lesion, ventriculomegaly, extra-axial collection or acute intracranial hemorrhage. Cervicomedullary junction and pituitary are within normal limits. No cortical encephalomalacia or chronic cerebral blood products identified. Patchy  moderate for age and nonspecific cerebral white matter T2 and FLAIR hyperintensity is stable. Deep gray nuclei, brainstem and cerebellum remain within normal limits. Vascular: Major intracranial vascular flow voids are preserved, stable since December. Skull and upper cervical spine: Partially visible cervical spine degeneration. Background bone marrow signal remains within normal limits. Sinuses/Orbits: Stable, negative. Other: Grossly normal visible internal auditory structures. Negative visible scalp and face. IMPRESSION: 1. No acute intracranial abnormality. 2. Stable moderate for age nonspecific cerebral white matter signal changes, most commonly due to small vessel disease. Electronically Signed   By: Odessa Fleming M.D.   On: 07/25/2023 09:03   CT ABDOMEN PELVIS WO CONTRAST Result Date: 07/25/2023 CLINICAL DATA:  Acute, nonlocalized abdominal pain.  Diarrhea EXAM: CT ABDOMEN AND PELVIS WITHOUT CONTRAST TECHNIQUE: Multidetector CT imaging of the abdomen and pelvis was performed following the standard protocol without IV contrast. RADIATION DOSE REDUCTION: This exam was performed according to the departmental dose-optimization program which includes automated exposure control, adjustment of the mA and/or kV according to patient size and/or use of iterative reconstruction technique. COMPARISON:  12/12/2021 FINDINGS: Lower chest:  Coronary atherosclerosis. Hepatobiliary: Low-density lesions in the subcapsular right lobe liver or subtle  compared to 07/13/2021 comparison with no detected growth compatible with benign process.Physiologic distension of the gallbladder with layering gallstone. Pancreas: Scattered coarse calcifications attributed to old pancreatitis, no interval change. Spleen: Unremarkable. Adrenals/Urinary Tract: Negative adrenals. No hydronephrosis or stone. 15 mm cyst in the interpolar left kidney. Fairly symmetric renal atrophy with right kidney measuring up to 6 cm in length. Unremarkable bladder. Stomach/Bowel:  No obstruction. No appendicitis. Vascular/Lymphatic: No acute vascular abnormality. No mass or adenopathy. Reproductive:Hysterectomy. Other: No ascites or pneumoperitoneum. Musculoskeletal: No acute abnormalities. IMPRESSION: 1. No acute finding. 2. Cholelithiasis, chronic pancreatitis, and renal atrophy. Electronically Signed   By: Tiburcio Pea M.D.   On: 07/25/2023 05:01    Labs:  CBC: Recent Labs    07/31/23 1401 08/01/23 0708 08/02/23 0550 08/03/23 0531  WBC 3.0* 3.0* 2.6* 2.7*  HGB 12.0 14.4 12.1 11.4*  HCT 38.7 44.4 37.9 35.6*  PLT 36* 24* 25* 29*    COAGS: Recent Labs    07/15/23 1302  INR 1.0  APTT 37*    BMP: Recent Labs    07/31/23 1401 08/01/23 0708 08/02/23 0550 08/03/23 0531  NA 137 138 133* 134*  K 4.1 5.1 4.3 4.8  CL 96* 99 93* 96*  CO2 24 20* 27 27  GLUCOSE 126* 82 94 113*  BUN 62* 75* 43* 64*  CALCIUM 8.9 8.7* 8.7* 8.5*  CREATININE 8.79* 10.42* 6.96* 9.16*  GFRNONAA 5* 4* 6* 5*    LIVER FUNCTION TESTS: Recent Labs    07/15/23 1022 07/25/23 0142 07/26/23 0422 07/27/23 0811 07/29/23 1930  BILITOT 0.7 0.8 0.9  --  1.4*  AST 23 24 23   --  36  ALT 16 21 16   --  21  ALKPHOS 66 58 58  --  63  PROT 7.9 7.4 7.1  --  7.9  ALBUMIN 3.8 3.8 3.5 3.2* 4.0    Assessment and Plan: This is a 58 year old female with PMHx of CLL, DM, HTN, ESRD on HD via RUE AVG. Patient's most recent imaging on 07/25/23 revealed no evidence of lymphadenopathy and her last CLL  treatment was in 2016. She now has new onset thrombocytopenia and leukopenia, hematology has seen the patient and recent flow cytometry was inconclusive. Request received for bone marrow biopsy to further evaluate.  The patient  has been NPO, labs and vitals have been reviewed.  Risks and benefits of image guided bone marrow biopsy with moderate sedation was discussed with the patient and/or patient's family including, but not limited to bleeding, infection, damage to adjacent structures or low yield requiring additional tests.  All of the questions were answered and there is agreement to proceed.  Consent signed and in chart.   Thank you for this interesting consult.  I greatly enjoyed meeting Chala Gul and look forward to participating in their care.  A copy of this report was sent to the requesting provider on this date.  Electronically Signed: Berneta Levins, PA-C 08/03/2023, 8:00 AM   I spent a total of 20 Minutes in face to face in clinical consultation, greater than 50% of which was counseling/coordinating care for thrombocytopenia and leukopenia.

## 2023-08-03 NOTE — Plan of Care (Signed)
  Problem: Skin Integrity: Goal: Risk for impaired skin integrity will decrease Outcome: Progressing   Problem: Tissue Perfusion: Goal: Adequacy of tissue perfusion will improve Outcome: Progressing   Problem: Clinical Measurements: Goal: Ability to maintain clinical measurements within normal limits will improve Outcome: Progressing   Problem: Clinical Measurements: Goal: Will remain free from infection Outcome: Progressing   Problem: Activity: Goal: Risk for activity intolerance will decrease Outcome: Progressing   Problem: Nutrition: Goal: Adequate nutrition will be maintained Outcome: Progressing   Problem: Coping: Goal: Level of anxiety will decrease Outcome: Progressing   Problem: Elimination: Goal: Will not experience complications related to bowel motility Outcome: Progressing

## 2023-08-03 NOTE — Progress Notes (Signed)
 [facility]  ROUNDING NOTE   Subjective:   Tonya Myers is a 58 year old female with past medical history including depression, type 2 diabetes, asthma, hypertension, hypothyroidism, CLL, and end-stage renal disease on hemodialysis.  She presents to the emergency department with near syncopal episode after dialysis. She has been admitted under observation for Weakness [R53.1] Unsteady gait [R26.81] Elevated troponin [R79.89] Chest pain [R07.9] Chest pain, unspecified type [R07.9]   Patient is known our practice and receives outpatient dialysis treatments at [facility] on a MWF schedule, supervised by [facility] physicians.    Patient seen sitting up in bed, postprocedure Bone marrow biopsy completed this morning Denies pain or discomfort at this time Breakfast tray at bedside, mostly untouched   Objective:  Vital signs in last 24 hours:  Temp:  [97.5 F (36.4 C)-98.9 F (37.2 C)] 98 F (36.7 C) (04/09 1145) Pulse Rate:  [63-93] 65 (04/09 1145) Resp:  [9-21] 14 (04/09 1145) BP: (101-138)/(60-87) 123/72 (04/09 1145) SpO2:  [92 %-100 %] 94 % (04/09 1145) Weight:  [87.9 kg-89.1 kg] 89.1 kg (04/09 1153)  Weight change:  Filed Weights   08/01/23 1600 08/03/23 0747 08/03/23 1153  Weight: 87.9 kg 87.9 kg 89.1 kg    Intake/Output: I/O last 3 completed shifts: In: 340 [P.O.:340] Out: 0    Intake/Output this shift:  No intake/output data recorded.  Physical Exam: General: NAD  Head: Normocephalic, atraumatic. Moist oral mucosal membranes  Eyes: Anicteric  Lungs:  Clear to auscultation, normal effort  Heart: Regular rate and rhythm  Abdomen:  Soft, nontender  Extremities:  No peripheral edema.  Neurologic: Alert and oriented moving all four extremities  Skin: No lesions  Access: Lt AVG    Basic Metabolic Panel: Recent Labs  Lab 07/30/23 0716 07/31/23 1401 08/01/23 0708 08/02/23 0550 08/03/23 0531  NA 136 137 138 133* 134*  K 3.6 4.1  5.1 4.3 4.8  CL 97* 96* 99 93* 96*  CO2 24 24 20* 27 27  GLUCOSE 93 126* 82 94 113*  BUN 33* 62* 75* 43* 64*  CREATININE 6.12* 8.79* 10.42* 6.96* 9.16*  CALCIUM 9.2 8.9 8.7* 8.7* 8.5*    Liver Function Tests: Recent Labs  Lab 07/29/23 1930  AST 36  ALT 21  ALKPHOS 63  BILITOT 1.4*  PROT 7.9  ALBUMIN 4.0   Recent Labs  Lab 07/29/23 1930  LIPASE 73*   No results for input(s): "AMMONIA" in the last 168 hours.   CBC: Recent Labs  Lab 07/30/23 0716 07/31/23 1401 08/01/23 0708 08/02/23 0550 08/03/23 0531  WBC 2.1* 3.0* 3.0* 2.6* 2.7*  HGB 12.6 12.0 14.4 12.1 11.4*  HCT 39.7 38.7 44.4 37.9 35.6*  MCV 94.7 95.8 91.2 92.9 92.2  PLT 31* 36* 24* 25* 29*    Cardiac Enzymes: No results for input(s): "CKTOTAL", "CKMB", "CKMBINDEX", "TROPONINI" in the last 168 hours.  BNP: Invalid input(s): "POCBNP"  CBG: Recent Labs  Lab 08/01/23 1635 08/01/23 1720 08/01/23 2212 08/02/23 0919 08/02/23 1714  GLUCAP 63* 75 96 117* 100*    Microbiology: Results for orders placed or performed during the hospital encounter of 07/25/23  SARS Coronavirus 2 by RT PCR (hospital order, performed in [facility] hospital lab) *cepheid single result test* Anterior Nasal Swab     Status: None   Collection Time: 07/25/23  1:30 AM   Specimen: Anterior Nasal Swab  Result Value Ref Range Status   SARS Coronavirus 2 by RT PCR NEGATIVE NEGATIVE Final    Comment: Performed  at [facility], [address] [place], [place] [address]    Coagulation Studies: No results for input(s): "LABPROT", "INR" in the last 72 hours.  Urinalysis: Recent Labs    08/01/23 0945  COLORURINE YELLOW*  LABSPEC 1.012  PHURINE 9.0*  GLUCOSEU NEGATIVE  HGBUR SMALL*  BILIRUBINUR NEGATIVE  KETONESUR NEGATIVE  PROTEINUR 100*  NITRITE NEGATIVE  LEUKOCYTESUR LARGE*      Imaging: No results found.    Medications:       ascorbic acid  500 mg Oral BID   atorvastatin  20 mg Oral Daily    calcium acetate  1,334 mg Oral TID WC   Chlorhexidine Gluconate Cloth  6 each Topical Q0600   fluticasone  2 spray Each Nare Daily   folic acid  1 mg Oral Daily   gabapentin  100 mg Oral BID   levothyroxine  125 mcg Oral Q0600   midodrine  5 mg Oral Once per day on Monday Wednesday Friday   multivitamin  1 tablet Oral QHS   pantoprazole  40 mg Oral Daily   senna-docusate  2 tablet Oral QHS   zinc sulfate (50mg  elemental zinc)  220 mg Oral Daily   acetaminophen **OR** acetaminophen, albuterol, heparin, hydrOXYzine, lidocaine-prilocaine, magnesium hydroxide, morphine injection, ondansetron **OR** ondansetron (ZOFRAN) IV, oxyCODONE-acetaminophen, pentafluoroprop-tetrafluoroeth, traZODone  Assessment/ Plan:  Tonya Myers is a 58 y.o.  female  with past medical history including depression, type 2 diabetes, asthma, hypertension, hypothyroidism, CLL, and end-stage renal disease on hemodialysis.  She presents to the emergency department with fever and diarrhea. She has been admitted under observation for Weakness [R53.1] Unsteady gait [R26.81] Elevated troponin [R79.89] Chest pain [R07.9] Chest pain, unspecified type [R07.9]   [facility]/MWF/right AVF/91 kg   Near syncopal episode, likely secondary to orthostatic.  Midodrine 5 mg ordered with dialysis 3 times weekly.  No further episodes since admission.  End stage renal disease on hemodialysis.  Scheduled to receive dialysis today, UF goal 2 to 2.5 L as tolerated.  Next treatment scheduled for Friday.  If stable, patient cleared to discharge after dialysis.  Will defer to primary team.  3. 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  Lab Results  Component Value Date   HGB 11.4 (L) 08/03/2023    Thrombocytopenia noted outpatient also. Mircera prescribed at outpatient clinic.  Hemoglobin within desired range..  4. Secondary Hyperparathyroidism: with outpatient labs: PTH 223, phosphorus 6.5, calcium 9.8 on  06/27/23.   Lab Results  Component Value Date   CALCIUM 8.5 (L) 08/03/2023   PHOS 8.1 (H) 07/27/2023   Currently prescribed calcitriol and calcium acetate outpatient.  Hyperphosphatemia noted.  Continue calcium acetate with meals.     LOS: 0 Markan Cazarez 4/9/202511:55 AM

## 2023-08-04 ENCOUNTER — Telehealth: Payer: Self-pay | Admitting: *Deleted

## 2023-08-04 NOTE — Telephone Encounter (Signed)
 She says that she was told that she can go home and she did not have to stay in the hospital for the results of bx. She went home and she got dizziness and she fell to the ground. Her son got her up. She is worried about plt. Count and her sone sent her to [facility]. She wants a call to her about this issue.

## 2023-08-05 LAB — SURGICAL PATHOLOGY

## 2023-08-10 ENCOUNTER — Encounter (HOSPITAL_COMMUNITY): Payer: Self-pay | Admitting: Oncology

## 2023-08-12 ENCOUNTER — Telehealth: Payer: Self-pay | Admitting: *Deleted

## 2023-08-12 ENCOUNTER — Inpatient Hospital Stay
Admission: EM | Admit: 2023-08-12 | Discharge: 2023-08-19 | DRG: 808 | Disposition: A | Discharge status: Home Health | Attending: Student | Admitting: Student

## 2023-08-12 ENCOUNTER — Other Ambulatory Visit: Payer: Self-pay

## 2023-08-12 ENCOUNTER — Telehealth: Payer: Self-pay | Admitting: Oncology

## 2023-08-12 ENCOUNTER — Inpatient Hospital Stay: Admitting: Oncology

## 2023-08-12 ENCOUNTER — Emergency Department

## 2023-08-12 ENCOUNTER — Inpatient Hospital Stay: Attending: Oncology

## 2023-08-12 DIAGNOSIS — E1129 Type 2 diabetes mellitus with other diabetic kidney complication: Secondary | ICD-10-CM | POA: Diagnosis present

## 2023-08-12 DIAGNOSIS — R627 Adult failure to thrive: Secondary | ICD-10-CM

## 2023-08-12 DIAGNOSIS — Z91040 Latex allergy status: Secondary | ICD-10-CM

## 2023-08-12 DIAGNOSIS — Z992 Dependence on renal dialysis: Secondary | ICD-10-CM | POA: Diagnosis not present

## 2023-08-12 DIAGNOSIS — K5900 Constipation, unspecified: Secondary | ICD-10-CM | POA: Diagnosis not present

## 2023-08-12 DIAGNOSIS — E876 Hypokalemia: Secondary | ICD-10-CM | POA: Diagnosis not present

## 2023-08-12 DIAGNOSIS — N3001 Acute cystitis with hematuria: Secondary | ICD-10-CM | POA: Diagnosis present

## 2023-08-12 DIAGNOSIS — D693 Immune thrombocytopenic purpura: Secondary | ICD-10-CM | POA: Diagnosis present

## 2023-08-12 DIAGNOSIS — D61818 Other pancytopenia: Principal | ICD-10-CM | POA: Diagnosis present

## 2023-08-12 DIAGNOSIS — K861 Other chronic pancreatitis: Secondary | ICD-10-CM | POA: Diagnosis present

## 2023-08-12 DIAGNOSIS — I951 Orthostatic hypotension: Secondary | ICD-10-CM | POA: Diagnosis present

## 2023-08-12 DIAGNOSIS — E1142 Type 2 diabetes mellitus with diabetic polyneuropathy: Secondary | ICD-10-CM | POA: Diagnosis present

## 2023-08-12 DIAGNOSIS — D631 Anemia in chronic kidney disease: Secondary | ICD-10-CM | POA: Diagnosis present

## 2023-08-12 DIAGNOSIS — F419 Anxiety disorder, unspecified: Secondary | ICD-10-CM | POA: Diagnosis present

## 2023-08-12 DIAGNOSIS — J45909 Unspecified asthma, uncomplicated: Secondary | ICD-10-CM | POA: Diagnosis present

## 2023-08-12 DIAGNOSIS — I120 Hypertensive chronic kidney disease with stage 5 chronic kidney disease or end stage renal disease: Secondary | ICD-10-CM | POA: Diagnosis present

## 2023-08-12 DIAGNOSIS — Z803 Family history of malignant neoplasm of breast: Secondary | ICD-10-CM

## 2023-08-12 DIAGNOSIS — Z881 Allergy status to other antibiotic agents status: Secondary | ICD-10-CM

## 2023-08-12 DIAGNOSIS — Z7982 Long term (current) use of aspirin: Secondary | ICD-10-CM

## 2023-08-12 DIAGNOSIS — W1830XA Fall on same level, unspecified, initial encounter: Secondary | ICD-10-CM | POA: Diagnosis present

## 2023-08-12 DIAGNOSIS — E039 Hypothyroidism, unspecified: Secondary | ICD-10-CM | POA: Diagnosis present

## 2023-08-12 DIAGNOSIS — F32A Depression, unspecified: Secondary | ICD-10-CM | POA: Diagnosis present

## 2023-08-12 DIAGNOSIS — E1122 Type 2 diabetes mellitus with diabetic chronic kidney disease: Secondary | ICD-10-CM | POA: Diagnosis present

## 2023-08-12 DIAGNOSIS — N2581 Secondary hyperparathyroidism of renal origin: Secondary | ICD-10-CM | POA: Diagnosis present

## 2023-08-12 DIAGNOSIS — N186 End stage renal disease: Secondary | ICD-10-CM | POA: Diagnosis present

## 2023-08-12 DIAGNOSIS — S82002A Unspecified fracture of left patella, initial encounter for closed fracture: Secondary | ICD-10-CM

## 2023-08-12 DIAGNOSIS — Z79890 Hormone replacement therapy (postmenopausal): Secondary | ICD-10-CM | POA: Diagnosis not present

## 2023-08-12 DIAGNOSIS — R296 Repeated falls: Secondary | ICD-10-CM | POA: Diagnosis present

## 2023-08-12 DIAGNOSIS — Z79899 Other long term (current) drug therapy: Secondary | ICD-10-CM

## 2023-08-12 DIAGNOSIS — B009 Herpesviral infection, unspecified: Secondary | ICD-10-CM | POA: Diagnosis present

## 2023-08-12 DIAGNOSIS — E785 Hyperlipidemia, unspecified: Secondary | ICD-10-CM

## 2023-08-12 DIAGNOSIS — Z888 Allergy status to other drugs, medicaments and biological substances status: Secondary | ICD-10-CM

## 2023-08-12 DIAGNOSIS — R531 Weakness: Secondary | ICD-10-CM | POA: Diagnosis present

## 2023-08-12 DIAGNOSIS — Z91041 Radiographic dye allergy status: Secondary | ICD-10-CM

## 2023-08-12 DIAGNOSIS — R5383 Other fatigue: Secondary | ICD-10-CM | POA: Diagnosis present

## 2023-08-12 DIAGNOSIS — Z880 Allergy status to penicillin: Secondary | ICD-10-CM

## 2023-08-12 DIAGNOSIS — D696 Thrombocytopenia, unspecified: Secondary | ICD-10-CM | POA: Diagnosis present

## 2023-08-12 DIAGNOSIS — Z856 Personal history of leukemia: Secondary | ICD-10-CM

## 2023-08-12 DIAGNOSIS — Z8661 Personal history of infections of the central nervous system: Secondary | ICD-10-CM

## 2023-08-12 LAB — BASIC METABOLIC PANEL WITH GFR
Anion gap: 15 (ref 5–15)
BUN: 24 mg/dL — ABNORMAL HIGH (ref 6–20)
CO2: 27 mmol/L (ref 22–32)
Calcium: 9.4 mg/dL (ref 8.9–10.3)
Chloride: 95 mmol/L — ABNORMAL LOW (ref 98–111)
Creatinine, Ser: 4.83 mg/dL — ABNORMAL HIGH (ref 0.44–1.00)
GFR, Estimated: 10 mL/min — ABNORMAL LOW (ref >60)
Glucose, Bld: 162 mg/dL — ABNORMAL HIGH (ref 70–99)
Potassium: 3.7 mmol/L (ref 3.5–5.1)
Sodium: 137 mmol/L (ref 135–145)

## 2023-08-12 LAB — URINALYSIS, ROUTINE W REFLEX MICROSCOPIC
Bacteria, UA: NONE SEEN
Bilirubin Urine: NEGATIVE
Glucose, UA: NEGATIVE mg/dL
Ketones, ur: NEGATIVE mg/dL
Nitrite: NEGATIVE
Protein, ur: >=300 mg/dL — AB
RBC / HPF: >50 RBC/hpf (ref 0–5)
Specific Gravity, Urine: 1.014 (ref 1.005–1.030)
WBC, UA: >50 WBC/hpf (ref 0–5)
pH: 8 (ref 5.0–8.0)

## 2023-08-12 LAB — CBC
HCT: 30.7 % — ABNORMAL LOW (ref 36.0–46.0)
Hemoglobin: 9.6 g/dL — ABNORMAL LOW (ref 12.0–15.0)
MCH: 30.4 pg (ref 26.0–34.0)
MCHC: 31.3 g/dL (ref 30.0–36.0)
MCV: 97.2 fL (ref 80.0–100.0)
Platelets: 26 10*3/uL — CL (ref 150–400)
RBC: 3.16 MIL/uL — ABNORMAL LOW (ref 3.87–5.11)
RDW: 20.9 % — ABNORMAL HIGH (ref 11.5–15.5)
WBC: 2.5 10*3/uL — ABNORMAL LOW (ref 4.0–10.5)
nRBC: 2 % — ABNORMAL HIGH (ref 0.0–0.2)

## 2023-08-12 MED ORDER — HYDROMORPHONE HCL 1 MG/ML IJ SOLN
0.5000 mg | INTRAMUSCULAR | Status: DC | PRN
  Administered 2023-08-13 – 2023-08-17 (×3): 1 mg via INTRAVENOUS
  Filled 2023-08-12 (×2): qty 1

## 2023-08-12 MED ORDER — PREDNISONE 50 MG PO TABS
80.0000 mg | ORAL_TABLET | Freq: Every day | ORAL | Status: DC
  Administered 2023-08-13 – 2023-08-18 (×6): 80 mg via ORAL
  Filled 2023-08-12 (×6): qty 1

## 2023-08-12 MED ORDER — MORPHINE SULFATE (PF) 4 MG/ML IV SOLN
4.0000 mg | Freq: Once | INTRAVENOUS | Status: AC
  Administered 2023-08-12: 4 mg via INTRAVENOUS
  Filled 2023-08-12: qty 1

## 2023-08-12 MED ORDER — SODIUM CHLORIDE 0.9 % IV SOLN
1.0000 g | Freq: Once | INTRAVENOUS | Status: AC
  Administered 2023-08-12: 1 g via INTRAVENOUS
  Filled 2023-08-12: qty 10

## 2023-08-12 MED ORDER — ONDANSETRON HCL 4 MG/2ML IJ SOLN
4.0000 mg | Freq: Four times a day (QID) | INTRAMUSCULAR | Status: DC | PRN
  Administered 2023-08-12 – 2023-08-19 (×4): 4 mg via INTRAVENOUS
  Filled 2023-08-12 (×3): qty 2

## 2023-08-12 MED ORDER — OXYCODONE HCL 5 MG PO TABS
5.0000 mg | ORAL_TABLET | ORAL | Status: DC | PRN
  Administered 2023-08-13 – 2023-08-19 (×5): 5 mg via ORAL
  Filled 2023-08-12 (×5): qty 1

## 2023-08-12 MED ORDER — ACETAMINOPHEN 325 MG PO TABS
650.0000 mg | ORAL_TABLET | Freq: Four times a day (QID) | ORAL | Status: DC | PRN
Start: 2023-08-12 — End: 2023-08-19

## 2023-08-12 MED ORDER — ACETAMINOPHEN 650 MG RE SUPP: 650.0000 mg | Freq: Four times a day (QID) | RECTAL | Status: DC | PRN

## 2023-08-12 MED ORDER — ONDANSETRON HCL 4 MG PO TABS: 4.0000 mg | ORAL_TABLET | Freq: Four times a day (QID) | ORAL | Status: DC | PRN

## 2023-08-12 NOTE — Telephone Encounter (Signed)
 I gave the message to Dr. Adrian Alba and she is already in ER and Dr. Adrian Alba to see her in ER. I tried to call the number and no one answered and the phone voice mail is full and can not leave a message

## 2023-08-12 NOTE — Telephone Encounter (Signed)
 Called patient to check on her since she missed hospital follow up- she states she would like a call back since she fell again and is getting scared . She wants someone to admit her to a facility.   She is driving right now and said to please wait about 10 mins before you call her.

## 2023-08-12 NOTE — ED Provider Notes (Signed)
 [facility] Provider Note    Event Date/Time   First MD Initiated Contact with Patient 08/12/23 1556     (approximate)   History   Fall   HPI Tonya Myers is a 58 y.o. female with history of CLL, DM2, ESRD on dialysis, HTN, HLD, neuropathy presenting today for multiple falls and weakness.  Patient was recently admitted at [facility] for multiple falls as well as thrombocytopenia.  She has had severe weakness throughout her entire body since then.  Continued to have falls and unable to take care of herself.  She is also noting dysuria symptoms but otherwise denies chest pain, shortness of breath, nausea, vomiting, abdominal pain.  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  Chart review: Reviewed notes during recent [facility] admission.  She was admitted for worsening thrombocytopenia and leukopenia with presyncope and fall.  PT/OT had recommended acute inpatient rehab but patient deferred and was discharged home with home health.     Physical Exam   Triage Vital Signs: ED Triage Vitals [08/12/23 1600]  Encounter Vitals Group     BP 119/67     Systolic BP Percentile      Diastolic BP Percentile      Pulse Rate 81     Resp 17     Temp 99.2 F (37.3 C)     Temp Source Oral     SpO2 100 %     Weight 194 lb (88 kg)     Height 6' (1.829 m)     Head Circumference      Peak Flow      Pain Score 10     Pain Loc      Pain Education      Exclude from Growth Chart     Most recent vital signs: Vitals:   08/12/23 1600  BP: 119/67  Pulse: 81  Resp: 17  Temp: 99.2 F (37.3 C)  SpO2: 100%   Physical Exam: I have reviewed the vital signs and nursing notes. General: Awake, alert, no acute distress.  Nontoxic appearing.  Globalized weakness. Head:  Atraumatic, normocephalic.   ENT:  EOM intact, PERRL. Oral mucosa is pink and moist with no lesions. Neck: Neck is supple with full  range of motion, No meningeal signs. Cardiovascular:  RRR, No murmurs. Peripheral pulses palpable and equal bilaterally. Respiratory:  Symmetrical chest wall expansion.  No rhonchi, rales, or wheezes.  Good air movement throughout.  No use of accessory muscles.   Musculoskeletal:  No cyanosis or edema. Moving extremities with full ROM Abdomen:  Soft, nontender, nondistended. Neuro:  GCS 15, moving all four extremities, interacting appropriately. Speech clear. Psych:  Calm, appropriate.   Skin:  Warm, dry, no rash.    ED Results / Procedures / Treatments   Labs (all labs ordered are listed, but only abnormal results are displayed) Labs Reviewed  BASIC METABOLIC PANEL WITH GFR - Abnormal; Notable for the following components:      Result Value   Chloride 95 (*)    Glucose, Bld 162 (*)    BUN 24 (*)    Creatinine, Ser 4.83 (*)    GFR, Estimated 10 (*)    All other components within normal limits  CBC - Abnormal; Notable for the following components:   WBC 2.5 (*)    RBC 3.16 (*)    Hemoglobin 9.6 (*)    HCT 30.7 (*)  RDW 20.9 (*)    Platelets 26 (*)    nRBC 2.0 (*)    All other components within normal limits  URINALYSIS, ROUTINE W REFLEX MICROSCOPIC - Abnormal; Notable for the following components:   Color, Urine AMBER (*)    APPearance CLOUDY (*)    Hgb urine dipstick SMALL (*)    Protein, ur >=300 (*)    Leukocytes,Ua MODERATE (*)    All other components within normal limits  URINE CULTURE     EKG My EKG interpretation: Rate of 82, normal sinus rhythm, normal axis, normal intervals.  No acute ST elevations or depressions   RADIOLOGY Independently interpreted x-rays of bilateral knees with no acute pathology   PROCEDURES:  Critical Care performed: No  Procedures   MEDICATIONS ORDERED IN ED: Medications  predniSONE  (DELTASONE ) tablet 80 mg (has no administration in time range)  cefTRIAXone  (ROCEPHIN ) 1 g in sodium chloride  0.9 % 100 mL IVPB (has no  administration in time range)  morphine  (PF) 4 MG/ML injection 4 mg (has no administration in time range)     IMPRESSION / MDM / ASSESSMENT AND PLAN / ED COURSE  I reviewed the triage vital signs and the nursing notes.                              Differential diagnosis includes, but is not limited to, CLL, pancytopenia, UTI, dehydration, electrolyte abnormality, weakness of chronic disease  Patient's presentation is most consistent with acute presentation with potential threat to life or bodily function.  Patient is a 58 year old female presenting today for ongoing weakness with multiple falls and inability to care for self at home as well as worsening thrombocytopenia.  Physical exam shows generalized weakness but otherwise largely unremarkable.  I do think some of her difficulty with ambulating is related to pain as she is tender throughout her lower extremities likely consistent with neuropathy as she is on gabapentin .  No obvious signs of infection.  She was seen in the room by hematology who is concerned with her platelets dropping that it may be related to ITP.  They have requested initiation of steroid trial over the weekend to see if that will help with her platelet count and continued monitoring in the hospital.  X-rays of bilateral knees showed no acute pathology from the fall.  Laboratory workup shows thrombocytopenia with platelets of 26.  Hematology still wanting steroid trial over the weekend.  Separately, UA which she has been having dysuria symptoms appears equivocal for possible UTI.  Will start on ceftriaxone  and send for urine culture.  Given ongoing falls with recent hospitalization recommending inpatient rehab, will admit for ongoing care of her thrombocytopenia and weakness.  The patient is on the cardiac monitor to evaluate for evidence of arrhythmia and/or significant heart rate changes. Clinical Course as of 08/12/23 1924  Fri Aug 12, 2023  1630 Hematology at bedside  recommending IV steroids daily over the weekend to treat potential ITP and admission [DW]    Clinical Course User Index [DW] Malvina Alm DASEN, MD     FINAL CLINICAL IMPRESSION(S) / ED DIAGNOSES   Final diagnoses:  Thrombocytopenia (HCC)  Multiple falls  Acute cystitis with hematuria     Rx / DC Orders   ED Discharge Orders     None        Note:  This document was prepared using Dragon voice recognition software and may include unintentional dictation errors.  Malvina Alm DASEN, MD 08/12/23 [phone]

## 2023-08-12 NOTE — Consult Note (Signed)
 [facility]  Telephone:([phone]) [phone] [phone]) [phone]  ID: [name] OB: 1965/07/31  MR#: [id number]  RDW#:[id number]  Patient Care Team: Valora Agent, MD as PCP - General (Family Medicine) Jacobo Evalene PARAS, MD as Consulting Physician (Oncology)  CHIEF COMPLAINT: Declining performance status with multiple falls, thrombocytopenia.  INTERVAL HISTORY: Patient is a 58 year old female with a history of end-stage renal disease on dialysis Monday, Wednesday, and Fridays.  She also has recently been noted to have a worsening thrombocytopenia, but bone marrow biopsy was unrevealing.  She presents to the emergency room with increasing falls at home and declining performance status.  She also has severe bilateral foot pain.  She is highly anxious.  She has no neurologic complaints.  She denies any recent fevers or illnesses.  She has a fair appetite, but denies weight loss.  She has no chest pain, shortness of breath, cough, or hemoptysis.  She denies any nausea, vomiting, constipation, or diarrhea.  She has no urinary complaints.  Patient feels generally terrible, but offers no further specific complaints today.  REVIEW OF SYSTEMS:   Review of Systems  Constitutional:  Positive for malaise/fatigue. Negative for fever and weight loss.  Respiratory: Negative.  Negative for cough, hemoptysis and shortness of breath.   Cardiovascular: Negative.  Negative for chest pain and leg swelling.  Gastrointestinal:  Negative for abdominal pain, blood in stool, constipation, diarrhea, melena, nausea and vomiting.  Genitourinary: Negative.   Musculoskeletal:  Positive for falls.  Skin: Negative.  Negative for rash.  Neurological:  Positive for weakness. Negative for dizziness, focal weakness and headaches.  Endo/Heme/Allergies:  Does not bruise/bleed easily.  Psychiatric/Behavioral:  The patient is nervous/anxious.     As per HPI. Otherwise, a complete review of systems is  negative.  PAST MEDICAL HISTORY: Past Medical History:  Diagnosis Date   Acute hemorrhoid 03/11/2015   Anxiety    Arthritis    Asthma    Cancer (HCC)    Chronic back pain    CLL (chronic lymphocytic leukemia) (HCC)    Depression    Diabetes mellitus without complication (HCC)    Genital herpes    type 2   Hypertension    Hypothyroidism    Vertigo     PAST SURGICAL HISTORY: Past Surgical History:  Procedure Laterality Date   ABDOMINAL HYSTERECTOMY     BILATERAL SALPINGOOPHORECTOMY  2009   BREAST BIOPSY Right 05/17/2016   FIBROADENOMATOUS CHANGE AND SCLEROSING ADENOSIS WITH   COLONOSCOPY WITH PROPOFOL  N/A 06/16/2015   Procedure: COLONOSCOPY WITH PROPOFOL ;  Surgeon: Donnice Vaughn Manes, MD;  Location: [facility] ENDOSCOPY;  Service: Endoscopy;  Laterality: N/A;   DIALYSIS/PERMA CATHETER INSERTION N/A 12/17/2020   Procedure: DIALYSIS/PERMA CATHETER INSERTION;  Surgeon: Marea Selinda RAMAN, MD;  Location: [facility] INVASIVE CV LAB;  Service: Cardiovascular;  Laterality: N/A;   LAPAROSCOPIC SUPRACERVICAL HYSTERECTOMY  2009   due to leio   OOPHORECTOMY     TEMPORARY DIALYSIS CATHETER N/A 12/10/2020   Procedure: TEMPORARY DIALYSIS CATHETER;  Surgeon: Jama Cordella MATSU, MD;  Location: [facility] INVASIVE CV LAB;  Service: Cardiovascular;  Laterality: N/A;   THORACIC LAMINECTOMY FOR EPIDURAL ABSCESS Bilateral 06/17/2020   Procedure: THORACIC LAMINECTOMY FOR EPIDURAL ABSCESS;  Surgeon: Bluford Standing, MD;  Location: [facility] ORS;  Service: Neurosurgery;  Laterality: Bilateral;    FAMILY HISTORY: Family History  Problem Relation Age of Onset   Cancer Paternal Aunt    Breast cancer Maternal Aunt 54    ADVANCED DIRECTIVES (Y/N):  @ADVDIR @  HEALTH MAINTENANCE: Social  History   Tobacco Use   Smoking status: Never   Smokeless tobacco: Never  Vaping Use   Vaping status: Never Used  Substance Use Topics   Alcohol use: Not Currently    Alcohol/week: 1.0 standard drink of alcohol    Types: 1 Cans of beer per  week   Drug use: No     Colonoscopy:  PAP:  Bone density:  Lipid panel:  Allergies  Allergen Reactions   Amoxapine     Other Reaction(s): Unknown   Latex    Ceftazidime     Other reaction(s): Confusion, Hallucination, Unknown Encephalopathy - improved after dialysis/some concern for cephalosporin neurotoxicity Tolerated without confusion 06/2021. Encephalopathy - improved after dialysis/some concern for cephalosporin neurotoxicity Tolerated without confusion 06/2021.    Iodinated Contrast Media Hives    Other Reaction(s): Hives   Penicillins     Other Reaction(s): Unknown    No current facility-administered medications for this encounter.   Current Outpatient Medications  Medication Sig Dispense Refill   acetaminophen  (TYLENOL ) 325 MG tablet Take 2 tablets (650 mg total) by mouth every 6 (six) hours as needed for mild pain (or Fever >/= 101).     albuterol  (VENTOLIN  HFA) 108 (90 Base) MCG/ACT inhaler Inhale 2 puffs into the lungs every 6 (six) hours as needed for wheezing or shortness of breath.     amitriptyline  (ELAVIL ) 10 MG tablet Take 10-20 mg by mouth at bedtime as needed (neuropathy pain).     ascorbic acid  (VITAMIN C ) 500 MG tablet Take 1 tablet (500 mg total) by mouth 2 (two) times daily.     [Paused] aspirin  EC 81 MG tablet Take 1 tablet (81 mg total) by mouth daily. Swallow whole. 30 tablet 11   atorvastatin  (LIPITOR) 20 MG tablet Take 20 mg by mouth daily.     calcium  acetate (PHOSLO ) 667 MG capsule Take 1,334 mg by mouth 3 (three) times daily with meals.     cetirizine (ZYRTEC) 10 MG tablet Take 1 tablet by mouth daily.     Continuous Blood Gluc Sensor (DEXCOM G6 SENSOR) MISC SMARTSIG:1 Each Topical Every 10 Days     fluticasone  (FLONASE ) 50 MCG/ACT nasal spray Place 2 sprays into both nostrils daily.     folic acid  (FOLVITE ) 1 MG tablet Take 1 tablet by mouth daily.     [Paused] furosemide  (LASIX ) 80 MG tablet Take 1 tablet by mouth daily.     gabapentin   (NEURONTIN ) 300 MG capsule TAKE 1 CAPSULE BY MOUTH THREE TIMES A DAY 90 capsule 3   hydrOXYzine  (ATARAX ) 25 MG tablet Take 1 tablet (25 mg total) by mouth 3 (three) times daily as needed. 30 tablet 0   lanthanum  (FOSRENOL ) 1000 MG chewable tablet Chew 1,000 mg by mouth 3 (three) times daily.     levothyroxine  (SYNTHROID ) 125 MCG tablet Take 125 mcg by mouth daily.     LOKELMA  10 g PACK packet Take 10 g by mouth once a week.     midodrine  (PROAMATINE ) 2.5 MG tablet Take 1 tablet (2.5 mg total) by mouth 3 (three) times a week. Take one tablet 15-30 minutes before dialysis 30 tablet 1   multivitamin (RENA-VIT) TABS tablet Take 1 tablet by mouth at bedtime.  0   ondansetron  (ZOFRAN -ODT) 4 MG disintegrating tablet Take 1 tablet (4 mg total) by mouth every 8 (eight) hours as needed for nausea or vomiting. 20 tablet 1   pantoprazole  (PROTONIX ) 40 MG tablet Take 1 tablet (40 mg total) by mouth daily.  30 tablet 1   [Paused] propranolol  (INDERAL ) 20 MG tablet Take 20 mg by mouth 3 (three) times daily.     [Paused] telmisartan  (MICARDIS ) 80 MG tablet Take 80 mg by mouth daily.     zinc  sulfate 220 (50 Zn) MG capsule Take 1 capsule (220 mg total) by mouth daily.      OBJECTIVE: Vitals:   08/12/23 1600  BP: 119/67  Pulse: 81  Resp: 17  Temp: 99.2 F (37.3 C)  SpO2: 100%     Body mass index is 26.31 kg/m.    ECOG FS:3 - Symptomatic, >50% confined to bed  General: Well-developed, well-nourished, no acute distress. Eyes: Pink conjunctiva, anicteric sclera. HEENT: Normocephalic, moist mucous membranes. Lungs: No audible wheezing or coughing. Heart: Regular rate and rhythm. Abdomen: Soft, nontender, no obvious distention. Musculoskeletal: No edema, cyanosis, or clubbing. Neuro: Alert, answering all questions appropriately. Cranial nerves grossly intact. Skin: No rashes or petechiae noted. Psych: Normal affect. Lymphatics: No cervical, calvicular, axillary or inguinal LAD.   LAB RESULTS:  Lab  Results  Component Value Date   NA 134 (L) 08/03/2023   K 4.8 08/03/2023   CL 96 (L) 08/03/2023   CO2 27 08/03/2023   GLUCOSE 113 (H) 08/03/2023   BUN 64 (H) 08/03/2023   CREATININE 9.16 (H) 08/03/2023   CALCIUM  8.5 (L) 08/03/2023   PROT 7.9 07/29/2023   ALBUMIN  4.0 07/29/2023   AST 36 07/29/2023   ALT 21 07/29/2023   ALKPHOS 63 07/29/2023   BILITOT 1.4 (H) 07/29/2023   GFRNONAA 5 (L) 08/03/2023   GFRAA >60 01/17/2020    Lab Results  Component Value Date   WBC 2.7 (L) 08/03/2023   NEUTROABS 2.3 07/18/2023   HGB 11.4 (L) 08/03/2023   HCT 35.6 (L) 08/03/2023   MCV 92.2 08/03/2023   PLT 29 (LL) 08/03/2023     STUDIES: CT BONE MARROW BIOPSY & ASPIRATION Result Date: 08/03/2023 INDICATION: History of CLL now with new onset thrombocytopenia and leukopenia. EXAM: CT GUIDED BONE MARROW ASPIRATION AND CORE BIOPSY Interventional Radiologist:  Wilkie LOIS Lent, MD MEDICATIONS: None. ANESTHESIA/SEDATION: Moderate (conscious) sedation was employed during this procedure. A total of 1 milligrams versed  and 50 micrograms fentanyl  were administered intravenously administered by the radiology nurse. The patient's level of consciousness and vital signs were monitored continuously by radiology nursing throughout the procedure under my direct supervision. Total monitored sedation time: 7 minutes FLUOROSCOPY: None. COMPLICATIONS: None immediate. Estimated blood loss: <25 mL PROCEDURE: Informed written consent was obtained from the patient after a thorough discussion of the procedural risks, benefits and alternatives. All questions were addressed. Maximal Sterile Barrier Technique was utilized including caps, mask, sterile gowns, sterile gloves, sterile drape, hand hygiene and skin antiseptic. A timeout was performed prior to the initiation of the procedure. The patient was positioned prone and non-contrast localization CT was performed of the pelvis to demonstrate the iliac marrow spaces. Maximal  barrier sterile technique utilized including caps, mask, sterile gowns, sterile gloves, large sterile drape, hand hygiene, and betadine prep. Under sterile conditions and local anesthesia, an 11 gauge coaxial bone biopsy needle was advanced into the left iliac marrow space. Needle position was confirmed with CT imaging. Initially, bone marrow aspiration was performed. Next, the 11 gauge outer cannula was utilized to obtain a left iliac bone marrow core biopsy. Needle was removed. Hemostasis was obtained with compression. The patient tolerated the procedure well. Samples were prepared with the cytotechnologist. IMPRESSION: Successful CT-guided left iliac bone marrow aspiration and core  biopsy. Electronically Signed   By: Wilkie Lent M.D.   On: 08/03/2023 12:37   DG Chest 2 View Result Date: 07/29/2023 CLINICAL DATA:  Weakness skip EXAM: CHEST - 2 VIEW COMPARISON:  Chest x-ray 12/12/2021 FINDINGS: The heart size and mediastinal contours are within normal limits. Both lungs are clear. The visualized skeletal structures are unremarkable. IMPRESSION: No active cardiopulmonary disease. Electronically Signed   By: Greig Pique M.D.   On: 07/29/2023 20:34   MR BRAIN WO CONTRAST Result Date: 07/25/2023 CLINICAL DATA:  58 year old female with syncope, neurologic deficit, diarrhea. EXAM: MRI HEAD WITHOUT CONTRAST TECHNIQUE: Multiplanar, multiecho pulse sequences of the brain and surrounding structures were obtained without intravenous contrast. COMPARISON:  Brain MRI 03/29/2023 and earlier. FINDINGS: Brain: Overall cerebral volume remains normal for age. No restricted diffusion to suggest acute infarction. No midline shift, mass effect, evidence of mass lesion, ventriculomegaly, extra-axial collection or acute intracranial hemorrhage. Cervicomedullary junction and pituitary are within normal limits. No cortical encephalomalacia or chronic cerebral blood products identified. Patchy moderate for age and nonspecific  cerebral white matter T2 and FLAIR hyperintensity is stable. Deep gray nuclei, brainstem and cerebellum remain within normal limits. Vascular: Major intracranial vascular flow voids are preserved, stable since December. Skull and upper cervical spine: Partially visible cervical spine degeneration. Background bone marrow signal remains within normal limits. Sinuses/Orbits: Stable, negative. Other: Grossly normal visible internal auditory structures. Negative visible scalp and face. IMPRESSION: 1. No acute intracranial abnormality. 2. Stable moderate for age nonspecific cerebral white matter signal changes, most commonly due to small vessel disease. Electronically Signed   By: VEAR Hurst M.D.   On: 07/25/2023 09:03   CT ABDOMEN PELVIS WO CONTRAST Result Date: 07/25/2023 CLINICAL DATA:  Acute, nonlocalized abdominal pain.  Diarrhea EXAM: CT ABDOMEN AND PELVIS WITHOUT CONTRAST TECHNIQUE: Multidetector CT imaging of the abdomen and pelvis was performed following the standard protocol without IV contrast. RADIATION DOSE REDUCTION: This exam was performed according to the departmental dose-optimization program which includes automated exposure control, adjustment of the mA and/or kV according to patient size and/or use of iterative reconstruction technique. COMPARISON:  12/12/2021 FINDINGS: Lower chest:  Coronary atherosclerosis. Hepatobiliary: Low-density lesions in the subcapsular right lobe liver or subtle compared to 07/13/2021 comparison with no detected growth compatible with benign process.Physiologic distension of the gallbladder with layering gallstone. Pancreas: Scattered coarse calcifications attributed to old pancreatitis, no interval change. Spleen: Unremarkable. Adrenals/Urinary Tract: Negative adrenals. No hydronephrosis or stone. 15 mm cyst in the interpolar left kidney. Fairly symmetric renal atrophy with right kidney measuring up to 6 cm in length. Unremarkable bladder. Stomach/Bowel:  No obstruction. No  appendicitis. Vascular/Lymphatic: No acute vascular abnormality. No mass or adenopathy. Reproductive:Hysterectomy. Other: No ascites or pneumoperitoneum. Musculoskeletal: No acute abnormalities. IMPRESSION: 1. No acute finding. 2. Cholelithiasis, chronic pancreatitis, and renal atrophy. Electronically Signed   By: Dorn Roulette M.D.   On: 07/25/2023 05:01    ASSESSMENT: Declining performance status with multiple falls, thrombocytopenia.  PLAN:    Thrombocytopenia: Patient underwent bone marrow biopsy on August 03, 2023 that revealed a normocellular bone marrow with mild dyspoietic changes and trilineage hematopoiesis.  No monoclonality B cells were noted despite the patient's history of CLL.  Previous workups were also unrevealing.  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 Leukopenia: Chronic and unchanged.  Bone marrow biopsy results as above. End-stage renal disease: Patient receives dialysis on Mondays, Wednesdays, and Fridays. Weakness and fatigue/falls/declining performance status: Likely multifactorial.  MRI of the brain on July 25, 2023 did not reveal any acute intracranial abnormality.  Patient reports she lives at home with minimal family for assistance and may require placement upon discharge. Anxiety: Patient may benefit from social work and/or psychiatry consult.  Appreciate consult, will follow.   Evalene JINNY Reusing, MD   08/12/2023 4:53 PM

## 2023-08-12 NOTE — Progress Notes (Signed)
   08/12/23 1815  Spiritual Encounters  Type of Visit Initial  Care provided to: Patient  Conversation partners present during encounter Nurse  Reason for visit Routine spiritual support  OnCall Visit Yes   Chaplain visited patient at patient's request.  Patient was very tearful and lamented about familial relationships and health concerns.  Patient is in need of support for healthcare.  Patient shared life experiences and other things she's overcome.  Patient has faith. Chaplain offered a compassionate presence and reflective listening.  Chaplain prayed with patient.  Rev. Rana M. Nicholaus, M.Div. Chaplain Resident [facility]

## 2023-08-12 NOTE — ED Triage Notes (Signed)
 Pt arrives via EMS from home for multiple falls. Pt has Hx of leukemia and is stating platelets dropped from 25 to 18. Recently discharged from [facility] on Sunday for same. Pt has had multiple falls since discharged form [facility]. Generalized weakness  EMS vitals: 129/78 BP HR in the 80s

## 2023-08-13 DIAGNOSIS — S82002A Unspecified fracture of left patella, initial encounter for closed fracture: Secondary | ICD-10-CM

## 2023-08-13 DIAGNOSIS — D696 Thrombocytopenia, unspecified: Secondary | ICD-10-CM | POA: Diagnosis not present

## 2023-08-13 DIAGNOSIS — R627 Adult failure to thrive: Secondary | ICD-10-CM

## 2023-08-13 LAB — CBC
HCT: 30.8 % — ABNORMAL LOW (ref 36.0–46.0)
Hemoglobin: 9.9 g/dL — ABNORMAL LOW (ref 12.0–15.0)
MCH: 31.1 pg (ref 26.0–34.0)
MCHC: 32.1 g/dL (ref 30.0–36.0)
MCV: 96.9 fL (ref 80.0–100.0)
Platelets: 35 10*3/uL — ABNORMAL LOW (ref 150–400)
RBC: 3.18 MIL/uL — ABNORMAL LOW (ref 3.87–5.11)
RDW: 21 % — ABNORMAL HIGH (ref 11.5–15.5)
WBC: 2.7 10*3/uL — ABNORMAL LOW (ref 4.0–10.5)
nRBC: 0 % (ref 0.0–0.2)

## 2023-08-13 LAB — BASIC METABOLIC PANEL WITH GFR
Anion gap: 14 (ref 5–15)
BUN: 34 mg/dL — ABNORMAL HIGH (ref 6–20)
CO2: 28 mmol/L (ref 22–32)
Calcium: 9 mg/dL (ref 8.9–10.3)
Chloride: 99 mmol/L (ref 98–111)
Creatinine, Ser: 6.06 mg/dL — ABNORMAL HIGH (ref 0.44–1.00)
GFR, Estimated: 7 mL/min — ABNORMAL LOW (ref >60)
Glucose, Bld: 108 mg/dL — ABNORMAL HIGH (ref 70–99)
Potassium: 3.3 mmol/L — ABNORMAL LOW (ref 3.5–5.1)
Sodium: 141 mmol/L (ref 135–145)

## 2023-08-13 MED ORDER — MELATONIN 5 MG PO TABS
5.0000 mg | ORAL_TABLET | Freq: Every day | ORAL | Status: DC
  Administered 2023-08-13 – 2023-08-18 (×6): 5 mg via ORAL
  Filled 2023-08-13 (×6): qty 1

## 2023-08-13 MED ORDER — DIPHENHYDRAMINE HCL 25 MG PO CAPS
25.0000 mg | ORAL_CAPSULE | Freq: Four times a day (QID) | ORAL | Status: DC | PRN
  Administered 2023-08-13: 25 mg via ORAL
  Filled 2023-08-13: qty 1

## 2023-08-13 MED ORDER — CALCIUM ACETATE (PHOS BINDER) 667 MG PO CAPS
1334.0000 mg | ORAL_CAPSULE | Freq: Three times a day (TID) | ORAL | Status: DC
  Administered 2023-08-13 – 2023-08-18 (×14): 1334 mg via ORAL
  Filled 2023-08-13 (×14): qty 2

## 2023-08-13 MED ORDER — LEVOTHYROXINE SODIUM 100 MCG PO TABS
125.0000 ug | ORAL_TABLET | Freq: Every day | ORAL | Status: DC
  Administered 2023-08-13 – 2023-08-19 (×7): 125 ug via ORAL
  Filled 2023-08-13 (×7): qty 1

## 2023-08-13 NOTE — Progress Notes (Signed)
 PROGRESS NOTE    Tonya Myers  ZOX:[id number] DOB: 1965/07/02 DOA: 08/12/2023 PCP: Lyle San, MD   Assessment & Plan:   Principal Problem:   Thrombocytopenia (HCC) Active Problems:   Failure to thrive in adult/frailty   Type 2 diabetes mellitus with peripheral neuropathy (HCC)   End-stage renal disease on hemodialysis (HCC)   Left patella fracture   Type II diabetes mellitus with renal manifestations (HCC)   Anemia in ESRD (end-stage renal disease) (HCC)  Assessment and Plan: Thrombocytopenia: etiology unclear. Recent bone marrow biopsy was unremarkable. Will continue to monitor  Generalized weakness: PT/OT consulted. Multiple recent hospitalizations  ESRD: on HD. Nephro consulted   ACD: likely secondary to ESRD. Will transfuse if Hb < 7.0  Hypokalemia: will managed w/ HD  Left patella fracture: continue w/ knee immobilizer prn for comfort. Weightbearing as tolerated. F/u outpatient w/ ortho surg  Orthostatic hypotension: continue on midodrine .  Hx of CLL: dx 2016. Recent bone marrow biopsy on 08/03/23 unremarkable  Possible UTI: urine cx is pending. Continue on IV rocephin   HSV: continue on home dose of valtrex   Hypothyroidism: continue on home dose of levothyroxine   DM2: well controlled, HbA1c 5.4.  Hx of seizures: pt stopped taking vimpat  in 2024 herself     DVT prophylaxis: SCDs Code Status: full Family Communication:  Disposition Plan: depends on PT/OT recs   Level of care: Progressive  Status is: Inpatient Remains inpatient appropriate because: severity of illness    Consultants:  Nephro   Procedures:   Antimicrobials:   Subjective: Pt c/o fatigue   Objective: Vitals:   08/13/23 0016 08/13/23 0435 08/13/23 0629 08/13/23 0804  BP: 110/69 (!) 94/59  106/67  Pulse: (!) 105 91  76  Resp: 20 18  19   Temp: 98.2 F (36.8 C) 99.1 F (37.3 C)  98.3 F (36.8 C)  TempSrc:  Oral  Oral  SpO2: 96% 97%  100%  Weight:   90.2 kg    Height:        Intake/Output Summary (Last 24 hours) at 08/13/2023 0817 Last data filed at 08/13/2023 0552 Gross per 24 hour  Intake 300 ml  Output --  Net 300 ml   Filed Weights   08/12/23 1600 08/12/23 2053 08/13/23 0629  Weight: 88 kg 88.5 kg 90.2 kg    Examination:  General exam: Appears calm and comfortable  Respiratory system: Clear to auscultation. Respiratory effort normal. Cardiovascular system: S1 & S2+. No rubs, gallops or clicks.  Gastrointestinal system: Abdomen is nondistended, soft and nontender.  Normal bowel sounds heard. Central nervous system: Alert and oriented. Moves all extremities  Psychiatry: Judgement and insight appear normal. Mood & affect appropriate.     Data Reviewed: I have personally reviewed following labs and imaging studies  CBC: Recent Labs  Lab 08/12/23 1745 08/13/23 0531  WBC 2.5* 2.7*  HGB 9.6* 9.9*  HCT 30.7* 30.8*  MCV 97.2 96.9  PLT 26* 35*   Basic Metabolic Panel: Recent Labs  Lab 08/12/23 1745 08/13/23 0531  NA 137 141  K 3.7 3.3*  CL 95* 99  CO2 27 28  GLUCOSE 162* 108*  BUN 24* 34*  CREATININE 4.83* 6.06*  CALCIUM  9.4 9.0   GFR: Estimated Creatinine Clearance: 12.6 mL/min (A) (by C-G formula based on SCr of 6.06 mg/dL (H)). Liver Function Tests: No results for input(s): "AST", "ALT", "ALKPHOS", "BILITOT", "PROT", "ALBUMIN" in the last 168 hours. No results for input(s): "LIPASE", "AMYLASE" in the last 168 hours. No results for  input(s): "AMMONIA" in the last 168 hours. Coagulation Profile: No results for input(s): "INR", "PROTIME" in the last 168 hours. Cardiac Enzymes: No results for input(s): "CKTOTAL", "CKMB", "CKMBINDEX", "TROPONINI" in the last 168 hours. BNP (last 3 results) No results for input(s): "PROBNP" in the last 8760 hours. HbA1C: No results for input(s): "HGBA1C" in the last 72 hours. CBG: No results for input(s): "GLUCAP" in the last 168 hours. Lipid Profile: No results for input(s):  "CHOL", "HDL", "LDLCALC", "TRIG", "CHOLHDL", "LDLDIRECT" in the last 72 hours. Thyroid  Function Tests: No results for input(s): "TSH", "T4TOTAL", "FREET4", "T3FREE", "THYROIDAB" in the last 72 hours. Anemia Panel: No results for input(s): "VITAMINB12", "FOLATE", "FERRITIN", "TIBC", "IRON", "RETICCTPCT" in the last 72 hours. Sepsis Labs: No results for input(s): "PROCALCITON", "LATICACIDVEN" in the last 168 hours.  No results found for this or any previous visit (from the past 240 hours).       Radiology Studies: DG Knee Complete 4 Views Left Result Date: 08/12/2023 CLINICAL DATA:  Ground level fall. EXAM: LEFT KNEE - COMPLETE 4+ VIEW COMPARISON:  None Available. FINDINGS: A small minimally displaced fracture fragment is seen along the dorsal lateral aspect of the left patella. There is no evidence of dislocation. A small area of medial cortical exostosis is seen along the supracondylar region of the distal left femur. Moderate severity tricompartmental degenerative changes are noted. A very small joint effusion is seen. IMPRESSION: 1. Findings suspicious for a small fracture of the dorsal lateral aspect of the left patella. Correlation with physical examination is recommended to determine the presence of point tenderness. 2. Mild cortical exostosis along the medial aspect of the distal left femur. 3. Moderate severity degenerative changes. 4. Very small joint effusion. Electronically Signed   By: Virgle Grime M.D.   On: 08/12/2023 19:50   DG Knee Complete 4 Views Right Result Date: 08/12/2023 CLINICAL DATA:  Ground level fall. EXAM: RIGHT KNEE - COMPLETE 4+ VIEW COMPARISON:  None Available. FINDINGS: No evidence of an acute fracture. Mild dorsal displacement of the right patella is noted which may be positional in nature. Moderate severity tricompartmental degenerative changes are noted. There is a small joint effusion. IMPRESSION: 1. No evidence of acute osseous abnormality. 2. Mild dorsal  displacement of the right patella which may be positional in nature. Correlation with physical examination is recommended. 3. Moderate severity degenerative changes. 4. Small joint effusion. Electronically Signed   By: Virgle Grime M.D.   On: 08/12/2023 19:47        Scheduled Meds:  levothyroxine   125 mcg Oral Q0600   predniSONE   80 mg Oral Q breakfast   Continuous Infusions:   LOS: 1 day       Alphonsus Jeans, MD [facility] Pager [phone]  If 7PM-7AM, please contact night-coverage [email or web address] 08/13/2023, 8:17 AM

## 2023-08-13 NOTE — Assessment & Plan Note (Signed)
 Continue knee immobilizer Pain control

## 2023-08-13 NOTE — Assessment & Plan Note (Addendum)
 Multiple recent hospitalizations, multiple chronic problems, unable to care for self Patient states she is now agreeable to going to SNF

## 2023-08-13 NOTE — Progress Notes (Signed)
 [facility]  ROUNDING NOTE   Subjective:   Tonya Myers is a 58 year old female with past medical history including depression, type 2 diabetes, asthma, hypertension, hypothyroidism, CLL, and end-stage renal disease on hemodialysis.  She presents to the emergency department with multiple falls. She has been admitted for Thrombocytopenia (HCC) [D69.6] Dyslipidemia [E78.5] Acute cystitis with hematuria [N30.01] Multiple falls [R29.6]  Patient is known our practice and receives outpatient dialysis treatments at [facility] on a MWF schedule, supervised by [facility] physicians.  It appears patient was recently admitted to [facility] for weakness and falls. She reports some of her medications were changed or stopped during that admission. She reports no recent missed dialysis appointments. She is seen on room air, denies pain. Just reports intermittent weakness that leads to falls. Currently lives alone.   Labs on ED arrival concerning for platelets 26, white count 2.5 and Hgb 9.6.   We have been consulted to provide dialysis during this admission.    Objective:  Vital signs in last 24 hours:  Temp:  [98.2 F (36.8 C)-99.2 F (37.3 C)] 98.3 F (36.8 C) (04/19 1136) Pulse Rate:  [76-105] 79 (04/19 1136) Resp:  [17-20] 18 (04/19 1136) BP: (94-128)/(59-82) 99/65 (04/19 1136) SpO2:  [96 %-100 %] 100 % (04/19 1136) Weight:  [88 kg-90.2 kg] 90.2 kg (04/19 0629)  Weight change:  Filed Weights   08/12/23 1600 08/12/23 2053 08/13/23 0629  Weight: 88 kg 88.5 kg 90.2 kg    Intake/Output: I/O last 3 completed shifts: In: 300 [P.O.:300] Out: -    Intake/Output this shift:  Total I/O In: 240 [P.O.:240] Out: -   Physical Exam: General: NAD  Head: Normocephalic, atraumatic. Moist oral mucosal membranes  Eyes: Anicteric  Lungs:  Clear to auscultation, normal effort  Heart: Regular rate and rhythm  Abdomen:  Soft, nontender  Extremities:  No peripheral  edema.  Neurologic: Alert and oriented moving all four extremities  Skin: No lesions  Access: Lt AVG    Basic Metabolic Panel: Recent Labs  Lab 08/12/23 1745 08/13/23 0531  NA 137 141  K 3.7 3.3*  CL 95* 99  CO2 27 28  GLUCOSE 162* 108*  BUN 24* 34*  CREATININE 4.83* 6.06*  CALCIUM  9.4 9.0    Liver Function Tests: No results for input(s): "AST", "ALT", "ALKPHOS", "BILITOT", "PROT", "ALBUMIN" in the last 168 hours.  No results for input(s): "LIPASE", "AMYLASE" in the last 168 hours.  No results for input(s): "AMMONIA" in the last 168 hours.   CBC: Recent Labs  Lab 08/12/23 1745 08/13/23 0531  WBC 2.5* 2.7*  HGB 9.6* 9.9*  HCT 30.7* 30.8*  MCV 97.2 96.9  PLT 26* 35*    Cardiac Enzymes: No results for input(s): "CKTOTAL", "CKMB", "CKMBINDEX", "TROPONINI" in the last 168 hours.  BNP: Invalid input(s): "POCBNP"  CBG: No results for input(s): "GLUCAP" in the last 168 hours.   Microbiology: Results for orders placed or performed during the hospital encounter of 07/25/23  SARS Coronavirus 2 by RT PCR (hospital order, performed in [facility] hospital lab) *cepheid single result test* Anterior Nasal Swab     Status: None   Collection Time: 07/25/23  1:30 AM   Specimen: Anterior Nasal Swab  Result Value Ref Range Status   SARS Coronavirus 2 by RT PCR NEGATIVE NEGATIVE Final    Comment: Performed at [facility], [address] [place], [place] [address]    Coagulation Studies: No results for input(s): "LABPROT", "INR" in  the last 72 hours.  Urinalysis: Recent Labs    08/12/23 1834  COLORURINE AMBER*  LABSPEC 1.014  PHURINE 8.0  GLUCOSEU NEGATIVE  HGBUR SMALL*  BILIRUBINUR NEGATIVE  KETONESUR NEGATIVE  PROTEINUR >=300*  NITRITE NEGATIVE  LEUKOCYTESUR MODERATE*      Imaging: DG Knee Complete 4 Views Left Result Date: 08/12/2023 CLINICAL DATA:  Ground level fall. EXAM: LEFT KNEE - COMPLETE 4+ VIEW COMPARISON:  None Available. FINDINGS:  A small minimally displaced fracture fragment is seen along the dorsal lateral aspect of the left patella. There is no evidence of dislocation. A small area of medial cortical exostosis is seen along the supracondylar region of the distal left femur. Moderate severity tricompartmental degenerative changes are noted. A very small joint effusion is seen. IMPRESSION: 1. Findings suspicious for a small fracture of the dorsal lateral aspect of the left patella. Correlation with physical examination is recommended to determine the presence of point tenderness. 2. Mild cortical exostosis along the medial aspect of the distal left femur. 3. Moderate severity degenerative changes. 4. Very small joint effusion. Electronically Signed   By: Virgle Grime M.D.   On: 08/12/2023 19:50   DG Knee Complete 4 Views Right Result Date: 08/12/2023 CLINICAL DATA:  Ground level fall. EXAM: RIGHT KNEE - COMPLETE 4+ VIEW COMPARISON:  None Available. FINDINGS: No evidence of an acute fracture. Mild dorsal displacement of the right patella is noted which may be positional in nature. Moderate severity tricompartmental degenerative changes are noted. There is a small joint effusion. IMPRESSION: 1. No evidence of acute osseous abnormality. 2. Mild dorsal displacement of the right patella which may be positional in nature. Correlation with physical examination is recommended. 3. Moderate severity degenerative changes. 4. Small joint effusion. Electronically Signed   By: Virgle Grime M.D.   On: 08/12/2023 19:47      Medications:       calcium  acetate  1,334 mg Oral TID WC   levothyroxine   125 mcg Oral Q0600   predniSONE   80 mg Oral Q breakfast   acetaminophen  **OR** acetaminophen , HYDROmorphone  (DILAUDID ) injection, ondansetron  **OR** ondansetron  (ZOFRAN ) IV, oxyCODONE   Assessment/ Plan:  Tonya Myers is a 58 y.o.  female  with past medical history including depression, type 2 diabetes, asthma,  hypertension, hypothyroidism, CLL, and end-stage renal disease on hemodialysis.  She presents to the emergency department with fever and diarrhea. She has been admitted under observation for Thrombocytopenia (HCC) [D69.6] Dyslipidemia [E78.5] Acute cystitis with hematuria [N30.01] Multiple falls [R29.6]   [facility]/MWF/right AVF/91 kg   End stage renal disease on hemodialysis.  Last treatment received on Friday. Will plan for next treatment on Monday, per outpatient schedule.   2. 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  Lab Results  Component Value Date   HGB 9.9 (L) 08/13/2023    Thrombocytopenia noted outpatient also. Mircera prescribed at outpatient clinic. Will continue to monitor Hgb and assess need for ESA.   3. Secondary Hyperparathyroidism: with outpatient labs: PTH 223, phosphorus 6.5, calcium  9.8 on 06/27/23.   Lab Results  Component Value Date   CALCIUM  9.0 08/13/2023   PHOS 8.1 (H) 07/27/2023  Currently prescribed calcitriol and calcium  acetate outpatient.  Will continue to monitor bone minerals during this admission.   4. 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 Most recent hemoglobin A1c is 5.6 on 07/29/23.     LOS: 1 Delson Dulworth 4/19/202511:54 AM

## 2023-08-13 NOTE — Assessment & Plan Note (Addendum)
 Stable pancytopenia CLL history with unremarkable bone marrow biopsy 08/03/23    Latest Ref Rng & Units 08/12/2023    5:45 PM 08/03/2023    5:31 AM 08/02/2023    5:50 AM  CBC  WBC 4.0 - 10.5 K/uL 2.5  2.7  2.6   Hemoglobin 12.0 - 15.0 g/dL 9.6  21.3  08.6   Hematocrit 36.0 - 46.0 % 30.7  35.6  37.9   Platelets 150 - 400 K/uL 26  29  25     Continue to monitor Continue prednisone  1 mg/kg x 7 days as recommended by oncology

## 2023-08-13 NOTE — Progress Notes (Signed)
 Pt requesting for melatonin, provider place order for melatonin.

## 2023-08-13 NOTE — Evaluation (Signed)
 Occupational Therapy Evaluation Patient Details Name: Naiara Lombardozzi MRN: [id number] DOB: 20-May-1965 Today's Date: 08/13/2023   History of Present Illness   Dwan Fennel is a 58 y.o. 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 Multiple recent hospitalizations 4/4-4/9 for presyncope attributed to orthostatic hypotension, rehospitalized at [facility] 4/10-4/13 with weakness and thrombocytopenia. Pt admitted for falls / inability to function at home in the setting of worsening thrombocytopenia. X-rays bilateral knees showed a left patella fracture   Clinical Impressions Ms Kapusta was seen for OT evaluation this date. Prior to hospital admission, pt endorses multiple falls. Pt lives alone with PRN assist from family/neighbours. Reports bed/bath upstairs, has been upstairs once with family assistance. Noted increased tone in R hand.   Pt currently requires MAX A don/doff B socks and knee immobilizer at bed level. MIN A + HHA sit<>stand and side steps along bed. CGA standing grooming tasks, tolerates ~2 min. SETUP seated UB bathing, MIN A LB bathing. Pt would benefit from skilled OT to address noted impairments and functional limitations (see below for any additional details). Upon hospital discharge, recommend OT follow up <3 hours/day.    If plan is discharge home, recommend the following:   A little help with walking and/or transfers;A little help with bathing/dressing/bathroom;Direct supervision/assist for medications management     Functional Status Assessment   Patient has had a recent decline in their functional status and demonstrates the ability to make significant improvements in function in a reasonable and predictable amount of time.     Equipment Recommendations   BSC/3in1     Recommendations for Other Services         Precautions/Restrictions    Precautions Precautions: Fall Recall of Precautions/Restrictions: Intact Restrictions Weight Bearing Restrictions Per Provider Order: Yes RLE Weight Bearing Per Provider Order: Weight bearing as tolerated Other Position/Activity Restrictions: KI PRN for comfort     Mobility Bed Mobility Overal bed mobility: Modified Independent                  Transfers Overall transfer level: Needs assistance Equipment used: 1 person hand held assist Transfers: Sit to/from Stand Sit to Stand: Min assist                  Balance Overall balance assessment: Needs assistance Sitting-balance support: No upper extremity supported, Feet supported Sitting balance-Leahy Scale: Good     Standing balance support: Single extremity supported, During functional activity Standing balance-Leahy Scale: Fair                             ADL either performed or assessed with clinical judgement   ADL Overall ADL's : Needs assistance/impaired                                       General ADL Comments: MAX A don/doff B socks and knee immobilizer at bed level. MIN A + HHA for simulated BSC t/f. CGA standing grooming tasks, tolerates ~2 min. SETUP seated UB bathing, MIN A LB bathing      Pertinent Vitals/Pain Pain Assessment Pain Assessment: 0-10 Pain Score: 9  Pain Location: L knee Pain Descriptors / Indicators: Grimacing, Discomfort Pain Intervention(s): Limited activity within patient's tolerance, Premedicated before session     Extremity/Trunk Assessment Upper Extremity  Assessment Upper Extremity Assessment: RUE deficits/detail RUE Deficits / Details: increased tone in R hand, achieves full digit/wrist extension with time but at rest maintains wrist/digit flexion. With activity uses R hand funcitonally however reports dropping items (not new but worse than previously)   Lower Extremity Assessment Lower Extremity Assessment: Generalized weakness        Communication Communication Communication: No apparent difficulties   Cognition Arousal: Alert Behavior During Therapy: WFL for tasks assessed/performed Cognition: No apparent impairments                               Following commands: Intact       Cueing  General Comments   Cueing Techniques: Verbal cues      Exercises     Shoulder Instructions      Home Living Family/patient expects to be discharged to:: Private residence Living Arrangements: Other relatives Available Help at Discharge: Family;Available PRN/intermittently;Neighbor Type of Home: House Home Access: Stairs to enter [employer] of Steps: 2 Entrance Stairs-Rails: None Home Layout: Two level;Bed/bath upstairs Alternate Level Stairs-Number of Steps: flight to 2nd floor bedroom Alternate Level Stairs-Rails: Left Bathroom Shower/Tub: Producer, television/film/video: Handicapped height     Home Equipment: Grab bars - tub/shower;Cane - single point;Rollator (4 wheels)          Prior Functioning/Environment Prior Level of Function : Independent/Modified Independent;Driving             Mobility Comments: reports 4 falls in the last month including falling down the stairs ADLs Comments: drives herself to HD    OT Problem List: Decreased activity tolerance;Impaired balance (sitting and/or standing);Decreased knowledge of use of DME or AE   OT Treatment/Interventions: Self-care/ADL training;Therapeutic exercise;Patient/family education;Therapeutic activities;DME and/or AE instruction      OT Goals(Current goals can be found in the care plan section)   Acute Rehab OT Goals Patient Stated Goal: go to rehab OT Goal Formulation: With patient Time For Goal Achievement: 08/27/23 Potential to Achieve Goals: Good ADL Goals Pt Will Perform Grooming: standing;Independently Pt Will Perform Lower Body Dressing: Independently;sit to/from stand Pt Will Transfer to Toilet: with  modified independence;ambulating;regular height toilet   OT Frequency:  Min 2X/week    Co-evaluation              AM-PAC OT "6 Clicks" Daily Activity     Outcome Measure Help from another person eating meals?: None Help from another person taking care of personal grooming?: A Little Help from another person toileting, which includes using toliet, bedpan, or urinal?: A Little Help from another person bathing (including washing, rinsing, drying)?: A Lot Help from another person to put on and taking off regular upper body clothing?: A Little Help from another person to put on and taking off regular lower body clothing?: A Lot 6 Click Score: 17   End of Session Nurse Communication: Mobility status  Activity Tolerance: Patient tolerated treatment well Patient left: in bed;with call bell/phone within reach;with bed alarm set  OT Visit Diagnosis: Other abnormalities of gait and mobility (R26.89)                Time: 1403-1430 OT Time Calculation (min): 27 min Charges:  OT General Charges $OT Visit: 1 Visit OT Evaluation $OT Eval Moderate Complexity: 1 Mod OT Treatments $Self Care/Home Management : 8-22 mins  Gordan Latina, M.S. OTR/L  08/13/23, 3:27 PM  ascom [phone]

## 2023-08-13 NOTE — Plan of Care (Signed)

## 2023-08-13 NOTE — H&P (Signed)
 History and Physical    Patient: Tonya Myers ZOX:[id number] DOB: June 11, 1965 DOA: 08/12/2023 DOS: the patient was seen and examined on 08/13/2023 PCP: Lyle San, MD  Patient coming from: Home  Chief Complaint:  Chief Complaint  Patient presents with   Fall    HPI: Tonya Myers is a 58 y.o. 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 (~2022) and protracted illness course with multiple admissions for encephalopathy and sepsis, now functionally much improved though with chronic neuropathy admitted to [facility] 4/4-4/9 for presyncope attributed to orthostatic hypotension discharged home with home health on midodrine , rehospitalized at [facility] 4/10-4/13 with weakness and thrombocytopenia, following up with hematology on 4/18, now sent from hematology office for inability to function at home in the setting of worsening thrombocytopenia for which she was started on a 7-day prednisone  burst. Patient has multiple vague complaints mostly notable for bilateral knee pain.  Oncology note reviewed.  Patient had a bone marrow biopsy on 4/9 that was unrevealing. ED course and data review: Vitals within normal limits Labs notable for pancytopenia with stable thrombocytopenia of 26,000.  Urinalysis with moderate leukocyte esterase EKG sinus at 82 with no ischemic changes X-rays bilateral knees showed a left patella fracture  Patient placed in a knee immobilizer Started on ceftriaxone  for possible UTI Treated with pain medication  Hospitalist consulted for admission.     Past Medical History:  Diagnosis Date   Acute hemorrhoid 03/11/2015   Anxiety    Arthritis    Asthma    Cancer (HCC)    Chronic back pain    CLL (chronic lymphocytic leukemia) (HCC)    Depression    Diabetes mellitus without complication (HCC)     Genital herpes    type 2   Hypertension    Hypothyroidism    Vertigo    Past Surgical History:  Procedure Laterality Date   ABDOMINAL HYSTERECTOMY     BILATERAL SALPINGOOPHORECTOMY  2009   BREAST BIOPSY Right 05/17/2016   FIBROADENOMATOUS CHANGE AND SCLEROSING ADENOSIS WITH   COLONOSCOPY WITH PROPOFOL  N/A 06/16/2015   Procedure: COLONOSCOPY WITH PROPOFOL ;  Surgeon: Luella Sager, MD;  Location: [facility] ENDOSCOPY;  Service: Endoscopy;  Laterality: N/A;   DIALYSIS/PERMA CATHETER INSERTION N/A 12/17/2020   Procedure: DIALYSIS/PERMA CATHETER INSERTION;  Surgeon: Celso College, MD;  Location: [facility] INVASIVE CV LAB;  Service: Cardiovascular;  Laterality: N/A;   LAPAROSCOPIC SUPRACERVICAL HYSTERECTOMY  2009   due to leio   OOPHORECTOMY     TEMPORARY DIALYSIS CATHETER N/A 12/10/2020   Procedure: TEMPORARY DIALYSIS CATHETER;  Surgeon: Jackquelyn Mass, MD;  Location: [facility] INVASIVE CV LAB;  Service: Cardiovascular;  Laterality: N/A;   THORACIC LAMINECTOMY FOR EPIDURAL ABSCESS Bilateral 06/17/2020   Procedure: THORACIC LAMINECTOMY FOR EPIDURAL ABSCESS;  Surgeon: Berta Brittle, MD;  Location: [facility] ORS;  Service: Neurosurgery;  Laterality: Bilateral;   Social History:  reports that she has never smoked. She has never used smokeless tobacco. She reports that she does not currently use alcohol after a past usage of about 1.0 standard drink of alcohol per week. She reports that she does not use drugs.  Allergies  Allergen Reactions   Amoxapine     Other Reaction(s): Unknown   Latex    Ceftazidime     Other reaction(s): Confusion, Hallucination, Unknown Encephalopathy - improved after dialysis/some concern for cephalosporin neurotoxicity Tolerated  without confusion 06/2021. Encephalopathy - improved after dialysis/some concern for cephalosporin neurotoxicity Tolerated without confusion 06/2021.    Iodinated Contrast Media Hives    Other Reaction(s): Hives   Penicillins     Other Reaction(s):  Unknown    Family History  Problem Relation Age of Onset   Cancer Paternal Aunt    Breast cancer Maternal Aunt 66    Prior to Admission medications   Medication Sig Start Date End Date Taking? Authorizing Provider  acetaminophen  (TYLENOL ) 325 MG tablet Take 2 tablets (650 mg total) by mouth every 6 (six) hours as needed for mild pain (or Fever >/= 101). 07/30/20  Yes Angiulli, Everlyn Hockey, PA-C  albuterol  (VENTOLIN  HFA) 108 (90 Base) MCG/ACT inhaler Inhale 2 puffs into the lungs every 6 (six) hours as needed for wheezing or shortness of breath. 10/18/21  Yes [provider]  ascorbic acid  (VITAMIN C ) 500 MG tablet Take 1 tablet (500 mg total) by mouth 2 (two) times daily. 12/22/21  Yes Sreenath, Sudheer B, MD  aspirin  EC 81 MG tablet Take 1 tablet (81 mg total) by mouth daily. Swallow whole. 10/02/20  Yes Brenna Cam, MD  atorvastatin  (LIPITOR) 20 MG tablet Take 20 mg by mouth daily. 10/18/21  Yes [provider]  calcium  acetate (PHOSLO ) 667 MG capsule Take 1,334 mg by mouth 3 (three) times daily with meals. 05/11/23 05/10/24 Yes [provider]  cetirizine (ZYRTEC) 10 MG tablet Take 1 tablet by mouth daily. 07/07/23  Yes [provider]  fluticasone  (FLONASE ) 50 MCG/ACT nasal spray Place 2 sprays into both nostrils daily.   Yes [provider]  folic acid  (FOLVITE ) 1 MG tablet Take 1 tablet by mouth daily. 10/18/21  Yes [provider]  gabapentin  (NEURONTIN ) 300 MG capsule TAKE 1 CAPSULE BY MOUTH THREE TIMES A DAY 07/07/23  Yes Dot Gazella, DPM  levothyroxine  (SYNTHROID ) 125 MCG tablet Take 125 mcg by mouth daily. 12/08/21  Yes [provider]  LOKELMA  10 g PACK packet Take 10 g by mouth once a week.   Yes [provider]  multivitamin (RENA-VIT) TABS tablet Take 1 tablet by mouth at bedtime. 12/22/21  Yes Sreenath, Sudheer B, MD  ondansetron  (ZOFRAN -ODT) 4 MG disintegrating tablet Take 1 tablet (4 mg total) by mouth every 8 (eight)  hours as needed for nausea or vomiting. 08/03/23  Yes Wouk, Haynes Lips, MD  pantoprazole  (PROTONIX ) 40 MG tablet Take 1 tablet (40 mg total) by mouth daily. 07/27/23  Yes Aisha Hove, MD  zinc  sulfate 220 (50 Zn) MG capsule Take 1 capsule (220 mg total) by mouth daily. 12/23/21  Yes Sreenath, Sudheer B, MD  amitriptyline  (ELAVIL ) 10 MG tablet Take 10-20 mg by mouth at bedtime as needed (neuropathy pain). Patient not taking: Reported on 08/12/2023    [provider]  Continuous Blood Gluc Sensor (DEXCOM G6 SENSOR) MISC SMARTSIG:1 Each Topical Every 10 Days 08/12/20   [provider]  fluconazole  (DIFLUCAN ) 150 MG tablet Take 150 mg by mouth once. Patient not taking: Reported on 08/12/2023 08/09/23   [provider]  furosemide  (LASIX ) 80 MG tablet Take 1 tablet by mouth daily. Patient not taking: Reported on 08/12/2023 07/20/23 08/23/24  [provider]  hydrOXYzine  (ATARAX ) 25 MG tablet Take 1 tablet (25 mg total) by mouth 3 (three) times daily as needed. Patient not taking: Reported on 08/12/2023 07/27/23   Aisha Hove, MD  lanthanum  (FOSRENOL ) 1000 MG chewable tablet Chew 1,000 mg by mouth 3 (three) times  daily. Patient not taking: Reported on 08/12/2023    [provider]  midodrine  (PROAMATINE ) 2.5 MG tablet Take 1 tablet (2.5 mg total) by mouth 3 (three) times a week. Take one tablet 15-30 minutes before dialysis Patient not taking: Reported on 08/12/2023 08/03/23   Wouk, Haynes Lips, MD  propranolol  (INDERAL ) 20 MG tablet Take 20 mg by mouth 3 (three) times daily. Patient not taking: Reported on 08/12/2023 03/17/23 03/16/24  [provider]  telmisartan  (MICARDIS ) 80 MG tablet Take 80 mg by mouth daily. Patient not taking: Reported on 08/12/2023 03/10/23 03/09/24  [provider]  budesonide-formoterol (SYMBICORT) 160-4.5 MCG/ACT inhaler Inhale into the lungs. 07/14/18 03/15/19  [provider]    Physical  Exam: Vitals:   08/12/23 1600 08/12/23 2053 08/13/23 0016  BP: 119/67 128/82 110/69  Pulse: 81 93 (!) 105  Resp: 17 20 20   Temp: 99.2 F (37.3 C) 98.5 F (36.9 C) 98.2 F (36.8 C)  TempSrc: Oral Axillary   SpO2: 100% 97% 96%  Weight: 88 kg 88.5 kg   Height: 6' (1.829 m) 6' (1.829 m)    Physical Exam Vitals and nursing note reviewed.  Constitutional:      General: She is not in acute distress. HENT:     Head: Normocephalic and atraumatic.  Cardiovascular:     Rate and Rhythm: Normal rate and regular rhythm.     Heart sounds: Normal heart sounds.  Pulmonary:     Effort: Pulmonary effort is normal.     Breath sounds: Normal breath sounds.  Abdominal:     Palpations: Abdomen is soft.     Tenderness: There is no abdominal tenderness.  Neurological:     Mental Status: Mental status is at baseline.     Labs on Admission: I have personally reviewed following labs and imaging studies  CBC: Recent Labs  Lab 08/12/23 1745  WBC 2.5*  HGB 9.6*  HCT 30.7*  MCV 97.2  PLT 26*   Basic Metabolic Panel: Recent Labs  Lab 08/12/23 1745  NA 137  K 3.7  CL 95*  CO2 27  GLUCOSE 162*  BUN 24*  CREATININE 4.83*  CALCIUM  9.4   GFR: Estimated Creatinine Clearance: 15.7 mL/min (A) (by C-G formula based on SCr of 4.83 mg/dL (H)). Liver Function Tests: No results for input(s): "AST", "ALT", "ALKPHOS", "BILITOT", "PROT", "ALBUMIN" in the last 168 hours. No results for input(s): "LIPASE", "AMYLASE" in the last 168 hours. No results for input(s): "AMMONIA" in the last 168 hours. Coagulation Profile: No results for input(s): "INR", "PROTIME" in the last 168 hours. Cardiac Enzymes: No results for input(s): "CKTOTAL", "CKMB", "CKMBINDEX", "TROPONINI" in the last 168 hours. BNP (last 3 results) No results for input(s): "PROBNP" in the last 8760 hours. HbA1C: No results for input(s): "HGBA1C" in the last 72 hours. CBG: No results for input(s): "GLUCAP" in the last 168  hours. Lipid Profile: No results for input(s): "CHOL", "HDL", "LDLCALC", "TRIG", "CHOLHDL", "LDLDIRECT" in the last 72 hours. Thyroid  Function Tests: No results for input(s): "TSH", "T4TOTAL", "FREET4", "T3FREE", "THYROIDAB" in the last 72 hours. Anemia Panel: No results for input(s): "VITAMINB12", "FOLATE", "FERRITIN", "TIBC", "IRON", "RETICCTPCT" in the last 72 hours. Urine analysis:    Component Value Date/Time   COLORURINE AMBER (A) 08/12/2023 1834   APPEARANCEUR CLOUDY (A) 08/12/2023 1834   LABSPEC 1.014 08/12/2023 1834   PHURINE 8.0 08/12/2023 1834   GLUCOSEU NEGATIVE 08/12/2023 1834   HGBUR SMALL (A) 08/12/2023 1834   BILIRUBINUR NEGATIVE 08/12/2023 1834  KETONESUR NEGATIVE 08/12/2023 1834   PROTEINUR >=300 (A) 08/12/2023 1834   NITRITE NEGATIVE 08/12/2023 1834   LEUKOCYTESUR MODERATE (A) 08/12/2023 1834    Radiological Exams on Admission: DG Knee Complete 4 Views Left Result Date: 08/12/2023 CLINICAL DATA:  Ground level fall. EXAM: LEFT KNEE - COMPLETE 4+ VIEW COMPARISON:  None Available. FINDINGS: A small minimally displaced fracture fragment is seen along the dorsal lateral aspect of the left patella. There is no evidence of dislocation. A small area of medial cortical exostosis is seen along the supracondylar region of the distal left femur. Moderate severity tricompartmental degenerative changes are noted. A very small joint effusion is seen. IMPRESSION: 1. Findings suspicious for a small fracture of the dorsal lateral aspect of the left patella. Correlation with physical examination is recommended to determine the presence of point tenderness. 2. Mild cortical exostosis along the medial aspect of the distal left femur. 3. Moderate severity degenerative changes. 4. Very small joint effusion. Electronically Signed   By: Virgle Grime M.D.   On: 08/12/2023 19:50   DG Knee Complete 4 Views Right Result Date: 08/12/2023 CLINICAL DATA:  Ground level fall. EXAM: RIGHT KNEE -  COMPLETE 4+ VIEW COMPARISON:  None Available. FINDINGS: No evidence of an acute fracture. Mild dorsal displacement of the right patella is noted which may be positional in nature. Moderate severity tricompartmental degenerative changes are noted. There is a small joint effusion. IMPRESSION: 1. No evidence of acute osseous abnormality. 2. Mild dorsal displacement of the right patella which may be positional in nature. Correlation with physical examination is recommended. 3. Moderate severity degenerative changes. 4. Small joint effusion. Electronically Signed   By: Virgle Grime M.D.   On: 08/12/2023 19:47     Data Reviewed: Relevant notes from primary care and specialist visits, past discharge summaries as available in EHR, including Care Everywhere. Prior diagnostic testing as pertinent to current admission diagnoses Updated medications and problem lists for reconciliation ED course, including vitals, labs, imaging, treatment and response to treatment Triage notes, nursing and pharmacy notes and ED provider's notes Notable results as noted in HPI   Assessment and Plan: * Thrombocytopenia (HCC) Stable pancytopenia CLL history with unremarkable bone marrow biopsy 08/03/23    Latest Ref Rng & Units 08/12/2023    5:45 PM 08/03/2023    5:31 AM 08/02/2023    5:50 AM  CBC  WBC 4.0 - 10.5 K/uL 2.5  2.7  2.6   Hemoglobin 12.0 - 15.0 g/dL 9.6  60.4  54.0   Hematocrit 36.0 - 46.0 % 30.7  35.6  37.9   Platelets 150 - 400 K/uL 26  29  25     Continue to monitor Continue prednisone  1 mg/kg x 7 days as recommended by oncology  Failure to thrive in adult/frailty Multiple recent hospitalizations, multiple chronic problems, unable to care for self Patient states she is now agreeable to going to SNF  Left patella fracture Continue knee immobilizer Pain control   Orthostatic hypotension with frequent falls Continue 5mg  midodrine  MWF prior to dialysis.  PT/OT recommended SNF but patient deferred  and was discharged home with home health.  History of CLL 2016, previously in remission  Bicytopenia (leukopenia and thrombocytopenia) Bone marrow biopsy on 4/9 largely unremarkable.    Abnormal UA  History of MDR UTIs Started on Rocephin  in the ED Will hold off pending culture  ESRD on HD M-W-F 2/2 ANCA GN Nephrology consulted during admission for iHD.  HSV Continued suppressive Valtrex .   Hypothyroidism  Continued home Synthroid  125 mcg.  Chronic pancreatitis Not taking Creon  on admission. Was supposed to get outpatient stool studies, fecal pancreatic elastase ordered during admission but did not result prior to discharge.  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  History of DM II Diet controlled. A1c 5.4   History of seizures Had captured seizure in 2022 on EEG, in setting of multi-month admission for sepsis and encephalopathy.  Followed by [facility]  self-dc'ed Vimpat  in 2024.   DVT prophylaxis: SCD  Consults: none  Advance Care Planning:   Code Status: Full Code   Family Communication: none  Disposition Plan: Back to previous home environment  Severity of Illness: The appropriate patient status for this patient is OBSERVATION. Observation status is judged to be reasonable and necessary in order to provide the required intensity of service to ensure the patient's safety. The patient's presenting symptoms, physical exam findings, and initial radiographic and laboratory data in the context of their medical condition is felt to place them at decreased risk for further clinical deterioration. Furthermore, it is anticipated that the patient will be medically stable for discharge from the hospital within 2 midnights of admission.   Author: Lanetta Pion, MD 08/13/2023 2:11 AM  For on call review [email or web address].

## 2023-08-13 NOTE — Plan of Care (Signed)
  Problem: Clinical Measurements: Goal: Ability to maintain clinical measurements within normal limits will improve Outcome: Progressing Goal: Will remain free from infection Outcome: Progressing Goal: Respiratory complications will improve Outcome: Progressing Goal: Cardiovascular complication will be avoided Outcome: Progressing   Problem: Pain Managment: Goal: General experience of comfort will improve and/or be controlled Outcome: Progressing   Problem: Coping: Goal: Level of anxiety will decrease Outcome: Progressing

## 2023-08-13 NOTE — Progress Notes (Signed)
 Knee immobilizer was delivered, RN was going to place to pt, but pt refused and said to wait until the morning. Educated pt about the importance of immobilizing her knee, still refuses. Will attempt to place in the morning. No other concern at the moment. Plan of care continued.

## 2023-08-13 NOTE — Plan of Care (Signed)
  Problem: Education: Goal: Knowledge of General Education information will improve Description: Including pain rating scale, medication(s)/side effects and non-pharmacologic comfort measures 08/13/2023 1614 by Karry Paganini, RN Outcome: Progressing 08/13/2023 1454 by Karry Paganini, RN Outcome: Progressing   Problem: Health Behavior/Discharge Planning: Goal: Ability to manage health-related needs will improve 08/13/2023 1614 by Karry Paganini, RN Outcome: Progressing 08/13/2023 1454 by Karry Paganini, RN Outcome: Progressing   Problem: Clinical Measurements: Goal: Ability to maintain clinical measurements within normal limits will improve 08/13/2023 1614 by Karry Paganini, RN Outcome: Progressing 08/13/2023 1454 by Karry Paganini, RN Outcome: Progressing Goal: Will remain free from infection 08/13/2023 1614 by Karry Paganini, RN Outcome: Progressing 08/13/2023 1454 by Karry Paganini, RN Outcome: Progressing Goal: Diagnostic test results will improve 08/13/2023 1614 by Karry Paganini, RN Outcome: Progressing 08/13/2023 1454 by Karry Paganini, RN Outcome: Progressing Goal: Respiratory complications will improve 08/13/2023 1614 by Karry Paganini, RN Outcome: Progressing 08/13/2023 1454 by Karry Paganini, RN Outcome: Progressing Goal: Cardiovascular complication will be avoided 08/13/2023 1614 by Karry Paganini, RN Outcome: Progressing 08/13/2023 1454 by Karry Paganini, RN Outcome: Progressing   Problem: Activity: Goal: Risk for activity intolerance will decrease 08/13/2023 1614 by Karry Paganini, RN Outcome: Progressing 08/13/2023 1454 by Karry Paganini, RN Outcome: Progressing   Problem: Nutrition: Goal: Adequate nutrition will be maintained 08/13/2023 1614 by Karry Paganini, RN Outcome: Progressing 08/13/2023 1454 by Karry Paganini, RN Outcome: Progressing   Problem: Coping: Goal: Level of anxiety will decrease 08/13/2023 1614 by Karry Paganini, RN Outcome:  Progressing 08/13/2023 1454 by Karry Paganini, RN Outcome: Progressing   Problem: Elimination: Goal: Will not experience complications related to bowel motility 08/13/2023 1614 by Karry Paganini, RN Outcome: Progressing 08/13/2023 1454 by Karry Paganini, RN Outcome: Progressing Goal: Will not experience complications related to urinary retention 08/13/2023 1614 by Karry Paganini, RN Outcome: Progressing 08/13/2023 1454 by Karry Paganini, RN Outcome: Progressing   Problem: Pain Managment: Goal: General experience of comfort will improve and/or be controlled 08/13/2023 1614 by Karry Paganini, RN Outcome: Progressing 08/13/2023 1454 by Karry Paganini, RN Outcome: Progressing   Problem: Safety: Goal: Ability to remain free from injury will improve 08/13/2023 1614 by Karry Paganini, RN Outcome: Progressing 08/13/2023 1454 by Karry Paganini, RN Outcome: Progressing   Problem: Skin Integrity: Goal: Risk for impaired skin integrity will decrease 08/13/2023 1614 by Karry Paganini, RN Outcome: Progressing 08/13/2023 1454 by Karry Paganini, RN Outcome: Progressing

## 2023-08-14 DIAGNOSIS — D696 Thrombocytopenia, unspecified: Secondary | ICD-10-CM | POA: Diagnosis not present

## 2023-08-14 LAB — BASIC METABOLIC PANEL WITH GFR
Anion gap: 14 (ref 5–15)
BUN: 68 mg/dL — ABNORMAL HIGH (ref 6–20)
CO2: 26 mmol/L (ref 22–32)
Calcium: 9 mg/dL (ref 8.9–10.3)
Chloride: 95 mmol/L — ABNORMAL LOW (ref 98–111)
Creatinine, Ser: 8.99 mg/dL — ABNORMAL HIGH (ref 0.44–1.00)
GFR, Estimated: 5 mL/min — ABNORMAL LOW (ref >60)
Glucose, Bld: 202 mg/dL — ABNORMAL HIGH (ref 70–99)
Potassium: 4.2 mmol/L (ref 3.5–5.1)
Sodium: 135 mmol/L (ref 135–145)

## 2023-08-14 LAB — CBC
HCT: 31.9 % — ABNORMAL LOW (ref 36.0–46.0)
Hemoglobin: 10 g/dL — ABNORMAL LOW (ref 12.0–15.0)
MCH: 30.4 pg (ref 26.0–34.0)
MCHC: 31.3 g/dL (ref 30.0–36.0)
MCV: 97 fL (ref 80.0–100.0)
Platelets: 35 10*3/uL — ABNORMAL LOW (ref 150–400)
RBC: 3.29 MIL/uL — ABNORMAL LOW (ref 3.87–5.11)
RDW: 20.5 % — ABNORMAL HIGH (ref 11.5–15.5)
WBC: 4.8 10*3/uL (ref 4.0–10.5)
nRBC: 0 % (ref 0.0–0.2)

## 2023-08-14 LAB — URINE CULTURE: Culture: NO GROWTH

## 2023-08-14 MED ORDER — VALACYCLOVIR HCL 500 MG PO TABS
500.0000 mg | ORAL_TABLET | Freq: Every day | ORAL | Status: DC
  Administered 2023-08-14 – 2023-08-18 (×5): 500 mg via ORAL
  Filled 2023-08-14 (×6): qty 1

## 2023-08-14 MED ORDER — DIPHENHYDRAMINE HCL 25 MG PO CAPS
50.0000 mg | ORAL_CAPSULE | Freq: Four times a day (QID) | ORAL | Status: DC | PRN
  Administered 2023-08-14 – 2023-08-16 (×4): 50 mg via ORAL
  Filled 2023-08-14 (×4): qty 2

## 2023-08-14 NOTE — Progress Notes (Signed)
 [facility]  ROUNDING NOTE   Subjective:   Tonya Myers is a 58 year old female with past medical history including depression, type 2 diabetes, asthma, hypertension, hypothyroidism, CLL, and end-stage renal disease on hemodialysis.  She presents to the emergency department with multiple falls. She has been admitted for Thrombocytopenia (HCC) [D69.6] Dyslipidemia [E78.5] Acute cystitis with hematuria [N30.01] Multiple falls [R29.6]  Patient is known our practice and receives outpatient dialysis treatments at [facility] on a MWF schedule, supervised by [facility] physicians.   Patient seen sitting up in chair Primary team at bedside Patient appears comfortable on room air Requesting for shift dialysis in a.m.  Objective:  Vital signs in last 24 hours:  Temp:  [97.5 F (36.4 C)-97.9 F (36.6 C)] 97.5 F (36.4 C) (04/20 0744) Pulse Rate:  [63-98] 98 (04/20 0744) Resp:  [12-20] 20 (04/20 0453) BP: (112-138)/(59-80) 138/59 (04/20 0744) SpO2:  [98 %-100 %] 100 % (04/20 0744) Weight:  [89.7 kg] 89.7 kg (04/20 0444)  Weight change: 1.678 kg Filed Weights   08/12/23 2053 08/13/23 0629 08/14/23 0444  Weight: 88.5 kg 90.2 kg 89.7 kg    Intake/Output: I/O last 3 completed shifts: In: 860 [P.O.:860] Out: -    Intake/Output this shift:  Total I/O In: 240 [P.O.:240] Out: -   Physical Exam: General: NAD, sitting in chair  Head: Normocephalic, atraumatic. Moist oral mucosal membranes  Eyes: Anicteric  Lungs:  Clear to auscultation, normal effort  Heart: Regular rate and rhythm  Abdomen:  Soft, nontender  Extremities:  No peripheral edema.  Neurologic: Alert and oriented moving all four extremities  Skin: No lesions  Access: Lt AVG    Basic Metabolic Panel: Recent Labs  Lab 08/12/23 1745 08/13/23 0531 08/14/23 0920  NA 137 141 135  K 3.7 3.3* 4.2  CL 95* 99 95*  CO2 27 28 26   GLUCOSE 162* 108* 202*  BUN 24* 34* 68*  CREATININE 4.83* 6.06*  8.99*  CALCIUM  9.4 9.0 9.0    Liver Function Tests: No results for input(s): "AST", "ALT", "ALKPHOS", "BILITOT", "PROT", "ALBUMIN" in the last 168 hours.  No results for input(s): "LIPASE", "AMYLASE" in the last 168 hours.  No results for input(s): "AMMONIA" in the last 168 hours.   CBC: Recent Labs  Lab 08/12/23 1745 08/13/23 0531 08/14/23 0920  WBC 2.5* 2.7* 4.8  HGB 9.6* 9.9* 10.0*  HCT 30.7* 30.8* 31.9*  MCV 97.2 96.9 97.0  PLT 26* 35* 35*    Cardiac Enzymes: No results for input(s): "CKTOTAL", "CKMB", "CKMBINDEX", "TROPONINI" in the last 168 hours.  BNP: Invalid input(s): "POCBNP"  CBG: No results for input(s): "GLUCAP" in the last 168 hours.   Microbiology: Results for orders placed or performed during the hospital encounter of 08/12/23  Urine Culture     Status: None   Collection Time: 08/12/23  6:33 PM   Specimen: Urine, Catheterized  Result Value Ref Range Status   Specimen Description   Final    URINE, CATHETERIZED Performed at [facility], [address] [place], [place] [address]    Special Requests   Final    NONE Performed at [facility], [address] [place], [place] [address]    Culture   Final    NO GROWTH Performed at [facility], [address], [place], [place] [address]    Report Status 08/14/2023 FINAL  Final    Coagulation Studies: No results for input(s): "LABPROT", "INR" in the last 72 hours.  Urinalysis: Recent Labs    08/12/23 1834  COLORURINE AMBER*  LABSPEC 1.014  PHURINE 8.0  GLUCOSEU NEGATIVE  HGBUR SMALL*  BILIRUBINUR NEGATIVE  KETONESUR NEGATIVE  PROTEINUR >=300*  NITRITE NEGATIVE  LEUKOCYTESUR MODERATE*      Imaging: DG Knee Complete 4 Views Left Result Date: 08/12/2023 CLINICAL DATA:  Ground level fall. EXAM: LEFT KNEE - COMPLETE 4+ VIEW COMPARISON:  None Available. FINDINGS: A small minimally displaced fracture fragment is seen along the dorsal lateral aspect of the left  patella. There is no evidence of dislocation. A small area of medial cortical exostosis is seen along the supracondylar region of the distal left femur. Moderate severity tricompartmental degenerative changes are noted. A very small joint effusion is seen. IMPRESSION: 1. Findings suspicious for a small fracture of the dorsal lateral aspect of the left patella. Correlation with physical examination is recommended to determine the presence of point tenderness. 2. Mild cortical exostosis along the medial aspect of the distal left femur. 3. Moderate severity degenerative changes. 4. Very small joint effusion. Electronically Signed   By: Virgle Grime M.D.   On: 08/12/2023 19:50   DG Knee Complete 4 Views Right Result Date: 08/12/2023 CLINICAL DATA:  Ground level fall. EXAM: RIGHT KNEE - COMPLETE 4+ VIEW COMPARISON:  None Available. FINDINGS: No evidence of an acute fracture. Mild dorsal displacement of the right patella is noted which may be positional in nature. Moderate severity tricompartmental degenerative changes are noted. There is a small joint effusion. IMPRESSION: 1. No evidence of acute osseous abnormality. 2. Mild dorsal displacement of the right patella which may be positional in nature. Correlation with physical examination is recommended. 3. Moderate severity degenerative changes. 4. Small joint effusion. Electronically Signed   By: Virgle Grime M.D.   On: 08/12/2023 19:47      Medications:       calcium  acetate  1,334 mg Oral TID WC   levothyroxine   125 mcg Oral Q0600   melatonin  5 mg Oral QHS   predniSONE   80 mg Oral Q breakfast   valACYclovir   500 mg Oral Daily   acetaminophen  **OR** acetaminophen , diphenhydrAMINE , HYDROmorphone  (DILAUDID ) injection, ondansetron  **OR** ondansetron  (ZOFRAN ) IV, oxyCODONE   Assessment/ Plan:  [name] is a 58 y.o.  female  with past medical history including depression, type 2 diabetes, asthma, hypertension, hypothyroidism,  CLL, and end-stage renal disease on hemodialysis.  She presents to the emergency department with fever and diarrhea. She has been admitted under observation for Thrombocytopenia (HCC) [D69.6] Dyslipidemia [E78.5] Acute cystitis with hematuria [N30.01] Multiple falls [R29.6]   [facility]/MWF/right AVF/91 kg   End stage renal disease on hemodialysis.  Next treatment scheduled for Monday.  2. 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  Lab Results  Component Value Date   HGB 10.0 (L) 08/14/2023    Thrombocytopenia noted outpatient also. Mircera prescribed at outpatient clinic.  Hemoglobin remains acceptable.  No need for ESA.  3. Secondary Hyperparathyroidism: with outpatient labs: PTH 223, phosphorus 6.5, calcium  9.8 on 06/27/23.   Lab Results  Component Value Date   CALCIUM  9.0 08/14/2023   PHOS 8.1 (H) 07/27/2023  Currently prescribed calcitriol and calcium  acetate outpatient.  Calcium  within optimal range.  Will recheck phosphorus levels in a.m. with dialysis.  4. 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 Most recent hemoglobin A1c is 5.6 on 07/29/23.     LOS: 2 Tonya Myers 4/20/202512:08 PM

## 2023-08-14 NOTE — Plan of Care (Signed)
  Problem: Clinical Measurements: Goal: Ability to maintain clinical measurements within normal limits will improve Outcome: Progressing Goal: Will remain free from infection Outcome: Progressing Goal: Respiratory complications will improve Outcome: Progressing Goal: Cardiovascular complication will be avoided Outcome: Progressing   Problem: Safety: Goal: Ability to remain free from injury will improve Outcome: Progressing   Problem: Pain Managment: Goal: General experience of comfort will improve and/or be controlled Outcome: Progressing

## 2023-08-14 NOTE — Progress Notes (Signed)
 PROGRESS NOTE    Tonya Myers  ZOX:[id number] DOB: April 02, 1966 DOA: 08/12/2023 PCP: Lyle San, MD   Assessment & Plan:   Principal Problem:   Thrombocytopenia (HCC) Active Problems:   Failure to thrive in adult/frailty   Type 2 diabetes mellitus with peripheral neuropathy (HCC)   End-stage renal disease on hemodialysis (HCC)   Left patella fracture   Type II diabetes mellitus with renal manifestations (HCC)   Anemia in ESRD (end-stage renal disease) (HCC)  Assessment and Plan: Thrombocytopenia: etiology unclear. Recent bone marrow biopsy was unremarkable. Will continue to monitor   Generalized weakness: PT/OT recs SNF. Pt is agreeable to SNF. Multiple recent hospitalizations  ESRD: on HD. Nephro following and recs apprec  ACD: likely secondary to ESRD. Will transfuse if Hb < 7.0   Hypokalemia: WNL today   Left patella fracture: continue w/ knee immobilizer prn for comfort. Weightbearing as tolerated. F/u outpatient w/ ortho surg  Orthostatic hypotension: continue on midodrine    Hx of CLL: dx 2016. Recent bone marrow biopsy on 08/03/23 unremarkable  Possible UTI: urine cx shows no growth. Continue on IV rocephin  x 3 days   HSV: w/ acute flare. Continue on home dose of valtrex    Hypothyroidism: continue on levothyroxine    DM2: HbA1c 5.4, well controlled.   Hx of seizures: pt stopped taking vimpat  in 2024 herself     DVT prophylaxis: SCDs Code Status: full Family Communication:  Disposition Plan: likely d/c to SNF   Level of care: Progressive  Status is: Inpatient Remains inpatient appropriate because: severity of illness    Consultants:  Nephro   Procedures:   Antimicrobials:   Subjective: Pt c/o itching   Objective: Vitals:   08/14/23 0444 08/14/23 0453 08/14/23 0744 08/14/23 1211  BP:  127/77 (!) 138/59 126/72  Pulse:  63 98 (!) 57  Resp:  20    Temp:  97.9 F (36.6 C) (!) 97.5 F (36.4 C) 98.5 F (36.9 C)  TempSrc:  Oral     SpO2:  100% 100% 98%  Weight: 89.7 kg     Height:        Intake/Output Summary (Last 24 hours) at 08/14/2023 1343 Last data filed at 08/14/2023 0900 Gross per 24 hour  Intake 560 ml  Output --  Net 560 ml   Filed Weights   08/12/23 2053 08/13/23 0629 08/14/23 0444  Weight: 88.5 kg 90.2 kg 89.7 kg    Examination:  General exam: Appears comfortable  Respiratory system: clear breath sounds b/l  Cardiovascular system: S1/S2+. No rubs or clicks  Gastrointestinal system: abd is soft, NT, ND & hypoactive bowel sounds  Central nervous system: alert & oriented. Moves all extremities  Psychiatry: Judgement and insight appears at baseline. Appropriate mood and affect      Data Reviewed: I have personally reviewed following labs and imaging studies  CBC: Recent Labs  Lab 08/12/23 1745 08/13/23 0531 08/14/23 0920  WBC 2.5* 2.7* 4.8  HGB 9.6* 9.9* 10.0*  HCT 30.7* 30.8* 31.9*  MCV 97.2 96.9 97.0  PLT 26* 35* 35*   Basic Metabolic Panel: Recent Labs  Lab 08/12/23 1745 08/13/23 0531 08/14/23 0920  NA 137 141 135  K 3.7 3.3* 4.2  CL 95* 99 95*  CO2 27 28 26   GLUCOSE 162* 108* 202*  BUN 24* 34* 68*  CREATININE 4.83* 6.06* 8.99*  CALCIUM  9.4 9.0 9.0   GFR: Estimated Creatinine Clearance: 8.5 mL/min (A) (by C-G formula based on SCr of 8.99 mg/dL (H)). Liver  Function Tests: No results for input(s): "AST", "ALT", "ALKPHOS", "BILITOT", "PROT", "ALBUMIN" in the last 168 hours. No results for input(s): "LIPASE", "AMYLASE" in the last 168 hours. No results for input(s): "AMMONIA" in the last 168 hours. Coagulation Profile: No results for input(s): "INR", "PROTIME" in the last 168 hours. Cardiac Enzymes: No results for input(s): "CKTOTAL", "CKMB", "CKMBINDEX", "TROPONINI" in the last 168 hours. BNP (last 3 results) No results for input(s): "PROBNP" in the last 8760 hours. HbA1C: No results for input(s): "HGBA1C" in the last 72 hours. CBG: No results for input(s): "GLUCAP"  in the last 168 hours. Lipid Profile: No results for input(s): "CHOL", "HDL", "LDLCALC", "TRIG", "CHOLHDL", "LDLDIRECT" in the last 72 hours. Thyroid  Function Tests: No results for input(s): "TSH", "T4TOTAL", "FREET4", "T3FREE", "THYROIDAB" in the last 72 hours. Anemia Panel: No results for input(s): "VITAMINB12", "FOLATE", "FERRITIN", "TIBC", "IRON", "RETICCTPCT" in the last 72 hours. Sepsis Labs: No results for input(s): "PROCALCITON", "LATICACIDVEN" in the last 168 hours.  Recent Results (from the past 240 hours)  Urine Culture     Status: None   Collection Time: 08/12/23  6:33 PM   Specimen: Urine, Catheterized  Result Value Ref Range Status   Specimen Description   Final    URINE, CATHETERIZED Performed at [facility], [address], [place], [place] [address]    Special Requests   Final    NONE Performed at [facility], [address], [place], [place] [address]    Culture   Final    NO GROWTH Performed at [facility], [address], [place], [place] [address]    Report Status 08/14/2023 FINAL  Final         Radiology Studies: DG Knee Complete 4 Views Left Result Date: 08/12/2023 CLINICAL DATA:  Ground level fall. EXAM: LEFT KNEE - COMPLETE 4+ VIEW COMPARISON:  None Available. FINDINGS: A small minimally displaced fracture fragment is seen along the dorsal lateral aspect of the left patella. There is no evidence of dislocation. A small area of medial cortical exostosis is seen along the supracondylar region of the distal left femur. Moderate severity tricompartmental degenerative changes are noted. A very small joint effusion is seen. IMPRESSION: 1. Findings suspicious for a small fracture of the dorsal lateral aspect of the left patella. Correlation with physical examination is recommended to determine the presence of point tenderness. 2. Mild cortical exostosis along the medial aspect of the distal left femur. 3. Moderate severity  degenerative changes. 4. Very small joint effusion. Electronically Signed   By: Virgle Grime M.D.   On: 08/12/2023 19:50   DG Knee Complete 4 Views Right Result Date: 08/12/2023 CLINICAL DATA:  Ground level fall. EXAM: RIGHT KNEE - COMPLETE 4+ VIEW COMPARISON:  None Available. FINDINGS: No evidence of an acute fracture. Mild dorsal displacement of the right patella is noted which may be positional in nature. Moderate severity tricompartmental degenerative changes are noted. There is a small joint effusion. IMPRESSION: 1. No evidence of acute osseous abnormality. 2. Mild dorsal displacement of the right patella which may be positional in nature. Correlation with physical examination is recommended. 3. Moderate severity degenerative changes. 4. Small joint effusion. Electronically Signed   By: Virgle Grime M.D.   On: 08/12/2023 19:47        Scheduled Meds:  calcium  acetate  1,334 mg Oral TID WC   levothyroxine   125 mcg Oral Q0600   melatonin  5 mg Oral QHS   predniSONE   80 mg Oral  Q breakfast   valACYclovir   500 mg Oral Daily   Continuous Infusions:   LOS: 2 days       Alphonsus Jeans, MD [facility] Pager [phone]  If 7PM-7AM, please contact night-coverage [email or web address] 08/14/2023, 1:43 PM

## 2023-08-14 NOTE — Evaluation (Addendum)
 Physical Therapy Evaluation Patient Details Name: Tonya Myers MRN: [id number] DOB: [birth year] Today's Date: 08/14/2023  History of Present Illness  presented to ER secondary to fall in home environment; admitted for management of thrombocytopenia.  Also noted with small minimally displaced fracture fragment is seen along the  dorsal lateral aspect of the left patella; WBAT, KI for comfort per MD.  Of note, additional recent hospitalizations (4/4-4/9) for presyncope and (4/10-4/13) for weakness, thrombocytopenia  Clinical Impression  Patient resting in bed upon arrival to session; alert and oriented, follows commands and agreeable to participation with session.  Eager for OOB to chair.  Endorses pain to L knee (8/10), improved with rest/repositioning; declined use of KI this date.  R UE noted with mild tremor, tone with functional activities; able to overcome with effort/attention for adequate gross functional use.  Bilat UEs generally weak and deconditioned (at least 3+ to 4-/5 throughout); no focal weakness appreciated.  Able to complete bed mobility with sup/mod indep; sit/stand, standing balance and bed/chair transfer with RW, min assist.  Demonstrates heavy use of UEs to assist with lift off and stabilization; tolerates weight through L LE with minimal increase in pain. Very choppy, intentional stepping pattern; limited balance reactions evident (high fall risk resulting).  Patient declined additional gait efforts due to generalized fatigue, L knee pain; will continue to assess/progress as appropriate in subsequent sessions. Would benefit from skilled PT to address above deficits and promote optimal return to PLOF.; recommend post-acute PT follow up as indicated by interdisciplinary care team.     Orthostatic VS for the past 24 hrs:  BP- Lying Pulse- Lying BP- Sitting Pulse- Sitting BP- Standing at 0 minutes Pulse- Standing at 0 minutes  08/14/23 0958 132/68 61 (!) 128/99 71 129/71 78            If plan is discharge home, recommend the following: Assist for transportation;Assistance with cooking/housework;Help with stairs or ramp for entrance;A lot of help with walking and/or transfers;A lot of help with bathing/dressing/bathroom   Can travel by private vehicle   Yes    Equipment Recommendations Rolling walker (2 wheels);BSC/3in1  Recommendations for Other Services       Functional Status Assessment Patient has had a recent decline in their functional status and demonstrates the ability to make significant improvements in function in a reasonable and predictable amount of time.     Precautions / Restrictions Precautions Precautions: Fall Recall of Precautions/Restrictions: Intact Restrictions Weight Bearing Restrictions Per Provider Order: Yes LLE Weight Bearing Per Provider Order: Weight bearing as tolerated Other Position/Activity Restrictions: KI PRN for comfort (patient declined this date)      Mobility  Bed Mobility Overal bed mobility: Needs Assistance Bed Mobility: Supine to Sit     Supine to sit: Modified independent (Device/Increase time)     General bed mobility comments: does self-assist LEs over edge of bed as needed    Transfers Overall transfer level: Needs assistance Equipment used: Rolling walker (2 wheels) Transfers: Sit to/from Stand, Bed to chair/wheelchair/BSC Sit to Stand: Min assist Stand pivot transfers: Min assist         General transfer comment: heavy use of UEs to assist with lift off and stabilization; tolerates weight through L LE with minimal increase in pain.  Very choppy, intentional stepping pattern; limited balance reactions evident    Ambulation/Gait               General Gait Details: patient declined due to generalized fatigue, pain in L  LE  Stairs            Wheelchair Mobility     Tilt Bed    Modified Rankin (Stroke Patients Only)       Balance Overall balance assessment:  Needs assistance Sitting-balance support: No upper extremity supported, Feet supported Sitting balance-Leahy Scale: Good     Standing balance support: Bilateral upper extremity supported Standing balance-Leahy Scale: Fair                               Pertinent Vitals/Pain Pain Assessment Pain Assessment: 0-10 Pain Score: 8  Pain Location: L knee Pain Descriptors / Indicators: Grimacing, Discomfort Pain Intervention(s): Limited activity within patient's tolerance, Monitored during session, Repositioned    Home Living Family/patient expects to be discharged to:: Private residence Living Arrangements: Other relatives Available Help at Discharge: Family;Available PRN/intermittently;Neighbor Type of Home: House Home Access: Stairs to enter Entrance Stairs-Rails: None Entrance Stairs-Number of Steps: 2   Home Layout: Two level;Bed/bath upstairs Home Equipment: Grab bars - tub/shower;Cane - single point;Rollator (4 wheels)      Prior Function Prior Level of Function : Independent/Modified Independent;Driving             Mobility Comments: Mod indep with SPC for household and limited community mobilization ("should have been using RW" per patient); drives self to/from dialysis.  Does endorse at least 4 falls in previous month.       Extremity/Trunk Assessment   Upper Extremity Assessment Upper Extremity Assessment: Overall WFL for tasks assessed RUE Deficits / Details: increased tone in R hand, achieves full digit/wrist extension with time but at rest maintains wrist/digit flexion. With activity uses R hand funcitonally however reports dropping items (not new but worse than previously)    Lower Extremity Assessment Lower Extremity Assessment: Generalized weakness (grossly 3+ to 4-/5 throughout; baseline paresthesia throughout bilat LEs.)       Communication   Communication Communication: No apparent difficulties    Cognition Arousal: Alert Behavior  During Therapy: WFL for tasks assessed/performed   PT - Cognitive impairments: No apparent impairments                       PT - Cognition Comments: pleasant and agreeable, eager for OOB Following commands: Intact       Cueing Cueing Techniques: Verbal cues     General Comments      Exercises     Assessment/Plan    PT Assessment Patient needs continued PT services  PT Problem List Decreased strength;Decreased balance;Decreased mobility;Decreased knowledge of use of DME;Decreased safety awareness;Pain       PT Treatment Interventions DME instruction;Gait training;Therapeutic exercise;Balance training;Stair training;Functional mobility training;Therapeutic activities;Cognitive remediation;Patient/family education    PT Goals (Current goals can be found in the Care Plan section)  Acute Rehab PT Goals Patient Stated Goal: to go to rehab and get stronger PT Goal Formulation: With patient Time For Goal Achievement: 08/28/23 Potential to Achieve Goals: Good    Frequency Min 2X/week     Co-evaluation               AM-PAC PT "6 Clicks" Mobility  Outcome Measure Help needed turning from your back to your side while in a flat bed without using bedrails?: None Help needed moving from lying on your back to sitting on the side of a flat bed without using bedrails?: None Help needed moving to and from a bed to a  chair (including a wheelchair)?: A Little Help needed standing up from a chair using your arms (e.g., wheelchair or bedside chair)?: A Little Help needed to walk in hospital room?: A Lot Help needed climbing 3-5 steps with a railing? : A Lot 6 Click Score: 18    End of Session   Activity Tolerance: Patient tolerated treatment well;Patient limited by fatigue;Patient limited by pain Patient left: with call bell/phone within reach;in chair;with chair alarm set Nurse Communication: Mobility status PT Visit Diagnosis: Muscle weakness (generalized)  (M62.81);History of falling (Z91.81);Unsteadiness on feet (R26.81);Difficulty in walking, not elsewhere classified (R26.2)    Time: 1610-9604 PT Time Calculation (min) (ACUTE ONLY): 19 min   Charges:   PT Evaluation $PT Eval Moderate Complexity: 1 Mod   PT General Charges $$ ACUTE PT VISIT: 1 Visit        Zainab Crumrine H. Bevin Bucks, PT, DPT, NCS 08/14/23, 10:06 AM [phone]

## 2023-08-15 ENCOUNTER — Encounter (HOSPITAL_COMMUNITY): Payer: Self-pay | Admitting: Oncology

## 2023-08-15 DIAGNOSIS — D696 Thrombocytopenia, unspecified: Secondary | ICD-10-CM | POA: Diagnosis not present

## 2023-08-15 LAB — BASIC METABOLIC PANEL WITH GFR
Anion gap: 13 (ref 5–15)
BUN: 97 mg/dL — ABNORMAL HIGH (ref 6–20)
CO2: 25 mmol/L (ref 22–32)
Calcium: 8.7 mg/dL — ABNORMAL LOW (ref 8.9–10.3)
Chloride: 98 mmol/L (ref 98–111)
Creatinine, Ser: 9.97 mg/dL — ABNORMAL HIGH (ref 0.44–1.00)
GFR, Estimated: 4 mL/min — ABNORMAL LOW (ref >60)
Glucose, Bld: 246 mg/dL — ABNORMAL HIGH (ref 70–99)
Potassium: 4.7 mmol/L (ref 3.5–5.1)
Sodium: 136 mmol/L (ref 135–145)

## 2023-08-15 LAB — GLUCOSE, CAPILLARY
Glucose-Capillary: 294 mg/dL — ABNORMAL HIGH (ref 70–99)
Glucose-Capillary: 404 mg/dL — ABNORMAL HIGH (ref 70–99)

## 2023-08-15 LAB — CBC
HCT: 29.5 % — ABNORMAL LOW (ref 36.0–46.0)
Hemoglobin: 9.5 g/dL — ABNORMAL LOW (ref 12.0–15.0)
MCH: 31 pg (ref 26.0–34.0)
MCHC: 32.2 g/dL (ref 30.0–36.0)
MCV: 96.4 fL (ref 80.0–100.0)
Platelets: 41 10*3/uL — ABNORMAL LOW (ref 150–400)
RBC: 3.06 MIL/uL — ABNORMAL LOW (ref 3.87–5.11)
RDW: 20.2 % — ABNORMAL HIGH (ref 11.5–15.5)
WBC: 7.4 10*3/uL (ref 4.0–10.5)
nRBC: 0 % (ref 0.0–0.2)

## 2023-08-15 LAB — HEPARIN INDUCED PLATELET AB (HIT ANTIBODY): Heparin Induced Plt Ab: 0.222 {OD_unit} (ref 0.000–0.400)

## 2023-08-15 LAB — PHOSPHORUS: Phosphorus: 6.3 mg/dL — ABNORMAL HIGH (ref 2.5–4.6)

## 2023-08-15 MED ORDER — INSULIN ASPART 100 UNIT/ML IJ SOLN
0.0000 [IU] | Freq: Three times a day (TID) | INTRAMUSCULAR | Status: DC
  Administered 2023-08-15: 3 [IU] via SUBCUTANEOUS
  Administered 2023-08-16: 2 [IU] via SUBCUTANEOUS
  Filled 2023-08-15: qty 1
  Filled 2023-08-15: qty 0.06
  Filled 2023-08-15: qty 1

## 2023-08-15 MED ORDER — FLUCONAZOLE 50 MG PO TABS
150.0000 mg | ORAL_TABLET | Freq: Every day | ORAL | Status: AC
  Administered 2023-08-15 – 2023-08-16 (×2): 150 mg via ORAL
  Filled 2023-08-15 (×2): qty 1

## 2023-08-15 NOTE — Progress Notes (Signed)
 [facility]  ROUNDING NOTE   Subjective:   Tonya Myers is a 58 year old female with past medical history including depression, type 2 diabetes, asthma, hypertension, hypothyroidism, CLL, and end-stage renal disease on hemodialysis.  She presents to the emergency department with multiple falls. She has been admitted for Thrombocytopenia (HCC) [D69.6] Dyslipidemia [E78.5] Acute cystitis with hematuria [N30.01] Multiple falls [R29.6]  Patient is known our practice and receives outpatient dialysis treatments at [facility] on a MWF schedule, supervised by [facility] physicians.   Update: Patient was due for dialysis treatment today. UF achieved was 2 kg.   Objective:  Vital signs in last 24 hours:  Temp:  [97.7 F (36.5 C)-98.4 F (36.9 C)] 98.3 F (36.8 C) (04/21 1635) Pulse Rate:  [44-73] 58 (04/21 1635) Resp:  [11-18] 12 (04/21 1430) BP: (107-142)/(58-91) 113/65 (04/21 1635) SpO2:  [92 %-100 %] 99 % (04/21 1635) Weight:  [88.9 kg-90.9 kg] 88.9 kg (04/21 1430)  Weight change:  Filed Weights   08/14/23 0444 08/15/23 1025 08/15/23 1430  Weight: 89.7 kg 90.9 kg 88.9 kg    Intake/Output: I/O last 3 completed shifts: In: 680 [P.O.:680] Out: -    Intake/Output this shift:  Total I/O In: -  Out: 2000 [Other:2000]  Physical Exam: General: NAD  Head: Normocephalic, atraumatic. Moist oral mucosal membranes  Eyes: Anicteric  Lungs:  Clear to auscultation, normal effort  Heart: Regular rate and rhythm  Abdomen:  Soft, nontender  Extremities: No peripheral edema.  Neurologic: Alert and oriented moving all four extremities  Skin: No lesions  Access: Lt AVG    Basic Metabolic Panel: Recent Labs  Lab 08/12/23 1745 08/13/23 0531 08/14/23 0920 08/15/23 0526  NA 137 141 135 136  K 3.7 3.3* 4.2 4.7  CL 95* 99 95* 98  CO2 27 28 26 25   GLUCOSE 162* 108* 202* 246*  BUN 24* 34* 68* 97*  CREATININE 4.83* 6.06* 8.99* 9.97*  CALCIUM  9.4 9.0 9.0  8.7*  PHOS  --   --   --  6.3*    Liver Function Tests: No results for input(s): "AST", "ALT", "ALKPHOS", "BILITOT", "PROT", "ALBUMIN" in the last 168 hours.  No results for input(s): "LIPASE", "AMYLASE" in the last 168 hours.  No results for input(s): "AMMONIA" in the last 168 hours.   CBC: Recent Labs  Lab 08/12/23 1745 08/13/23 0531 08/14/23 0920 08/15/23 0526  WBC 2.5* 2.7* 4.8 7.4  HGB 9.6* 9.9* 10.0* 9.5*  HCT 30.7* 30.8* 31.9* 29.5*  MCV 97.2 96.9 97.0 96.4  PLT 26* 35* 35* 41*    Cardiac Enzymes: No results for input(s): "CKTOTAL", "CKMB", "CKMBINDEX", "TROPONINI" in the last 168 hours.  BNP: Invalid input(s): "POCBNP"  CBG: Recent Labs  Lab 08/15/23 1633  GLUCAP 294*     Microbiology: Results for orders placed or performed during the hospital encounter of 08/12/23  Urine Culture     Status: None   Collection Time: 08/12/23  6:33 PM   Specimen: Urine, Catheterized  Result Value Ref Range Status   Specimen Description   Final    URINE, CATHETERIZED Performed at [facility], [address], [place], [place] [address]    Special Requests   Final    NONE Performed at [facility], [address] [place], [place] [address]    Culture   Final    NO GROWTH Performed at [facility], [address] [place], [place] [address]    Report Status 08/14/2023 FINAL  Final    Coagulation Studies: No results for input(s): "LABPROT", "INR" in the last 72 hours.  Urinalysis: Recent Labs    08/12/23 1834  COLORURINE AMBER*  LABSPEC 1.014  PHURINE 8.0  GLUCOSEU NEGATIVE  HGBUR SMALL*  BILIRUBINUR NEGATIVE  KETONESUR NEGATIVE  PROTEINUR >=300*  NITRITE NEGATIVE  LEUKOCYTESUR MODERATE*      Imaging: No results found.     Medications:       calcium  acetate  1,334 mg Oral TID WC   fluconazole   150 mg Oral Daily   insulin  aspart  0-6 Units Subcutaneous TID WC   levothyroxine   125 mcg Oral Q0600   melatonin  5  mg Oral QHS   predniSONE   80 mg Oral Q breakfast   valACYclovir   500 mg Oral Daily   acetaminophen  **OR** acetaminophen , diphenhydrAMINE , HYDROmorphone  (DILAUDID ) injection, ondansetron  **OR** ondansetron  (ZOFRAN ) IV, oxyCODONE   Assessment/ Plan:  Tonya Myers is a 58 y.o.  female  with past medical history including depression, type 2 diabetes, asthma, hypertension, hypothyroidism, CLL, and end-stage renal disease on hemodialysis.  She presents to the emergency department with fever and diarrhea. She has been admitted under observation for Thrombocytopenia (HCC) [D69.6] Dyslipidemia [E78.5] Acute cystitis with hematuria [N30.01] Multiple falls [R29.6]   [facility]/MWF/right AVF/91 kg   End stage renal disease on hemodialysis.  Patient underwent dialysis treatment today and UF achieved was 2 kg.  Next dialysis treatment will be scheduled for Wednesday if still here.  2. 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  Lab Results  Component Value Date   HGB 9.5 (L) 08/15/2023    Hemoglobin slightly low at 9.5.  She receives Mircera as outpatient.  3. Secondary Hyperparathyroidism: with outpatient labs: PTH 223, phosphorus 6.5, calcium  9.8 on 06/27/23.   Lab Results  Component Value Date   CALCIUM  8.7 (L) 08/15/2023   PHOS 6.3 (H) 08/15/2023  Currently prescribed calcitriol and calcium  acetate outpatient.  Phosphorus slightly high at 6.3 which is not unexpected for a Monday.  Maintain the patient on calcium  acetate 2 tablets p.o. 3 times daily with meals and monitor volume status and parameters.  4. 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 Most recent hemoglobin A1c is 5.6 on 07/29/23.     LOS: 3 [name] 4/21/20254:52 PM

## 2023-08-15 NOTE — Progress Notes (Signed)
 Hemodialysis Note:  Received patient in bed to unit. Alert and oriented. Informed consent singed and in chart.  Treatment initiated: 1017 Treatment completed: 1430  Access used: Right AVF Access issues: none  Patient tolerated well. Transported back to room, alert without acute distress. Report given to patient's RN.  Total UF removed: 2 liters Medications given: None  Post HD weight: 88.9 Kg  [facility]

## 2023-08-15 NOTE — Progress Notes (Signed)
 PROGRESS NOTE    Tonya Myers  ZOX:[id number] DOB: 1966/03/02 DOA: 08/12/2023 PCP: Lyle San, MD   Assessment & Plan:   Principal Problem:   Thrombocytopenia (HCC) Active Problems:   Failure to thrive in adult/frailty   Type 2 diabetes mellitus with peripheral neuropathy (HCC)   End-stage renal disease on hemodialysis (HCC)   Left patella fracture   Type II diabetes mellitus with renal manifestations (HCC)   Anemia in ESRD (end-stage renal disease) (HCC)  Assessment and Plan: Thrombocytopenia: etiology unclear. Trending up today. Continue on steroids x 7 days as per heme. Recent bone marrow biopsy was unremarkable. Will continue to monitor   Generalized weakness: PT/OT recs SNF. Pt is agreeable to SNF. Multiple recent hospitalizations  ESRD: on HD MWF. Nephro following and recs apprec   ACD: likely secondary to ESRD. Will transfuse if Hb < 7.0   Hypokalemia: WNL today   Left patella fracture: continue w/ knee immobilizer prn for comfort. Weightbearing as tolerated. F/u outpatient w/ ortho surg  Orthostatic hypotension: continue on midodrine    Hx of CLL: dx 2016. Recent bone marrow biopsy on 08/03/23 unremarkable  Possible UTI: urine cx shows no growth. Completed 3 day course of abxs   HSV: w/ acute flare. Continue on valtrex   Possible vaginal yeast infection: fluconazole  x 2   Hypothyroidism: continue on levothyroxine    DM2: HbA1c 5.4, well controlled. Will start SSI w/ accuchecks while on steroids  Hx of seizures: pt stopped taking vimpat  in 2024 herself     DVT prophylaxis: SCDs Code Status: full Family Communication:  Disposition Plan: likely d/c to SNF   Level of care: Progressive  Status is: Inpatient Remains inpatient appropriate because: severity of illness, still needs SNF placement     Consultants:  Nephro   Procedures:   Antimicrobials:   Subjective: Pt still c/o itching   Objective: Vitals:   08/14/23 2010 08/15/23  0022 08/15/23 0453 08/15/23 0744  BP: 129/72 (!) 142/62 120/64 (!) 125/58  Pulse: (!) 55 (!) 46 (!) 48 (!) 44  Resp: 18 18 18    Temp: 98 F (36.7 C) 97.7 F (36.5 C) 97.8 F (36.6 C) 98.4 F (36.9 C)  TempSrc:   Oral   SpO2: 99% 96% 98% 97%  Weight:      Height:        Intake/Output Summary (Last 24 hours) at 08/15/2023 0843 Last data filed at 08/14/2023 1900 Gross per 24 hour  Intake 480 ml  Output --  Net 480 ml   Filed Weights   08/12/23 2053 08/13/23 0629 08/14/23 0444  Weight: 88.5 kg 90.2 kg 89.7 kg    Examination:  General exam: appears calm & comfortable  Respiratory system: clear breath sounds b/l  Cardiovascular system: S1 & S2+. No rubs or clicks  Gastrointestinal system: abd is soft, NT, ND & hypoactive bowel sounds   Central nervous system: alert & oriented. Moves all extremities  Psychiatry: Judgement and insight appears at baseline. Flat mood and affect    Data Reviewed: I have personally reviewed following labs and imaging studies  CBC: Recent Labs  Lab 08/12/23 1745 08/13/23 0531 08/14/23 0920 08/15/23 0526  WBC 2.5* 2.7* 4.8 7.4  HGB 9.6* 9.9* 10.0* 9.5*  HCT 30.7* 30.8* 31.9* 29.5*  MCV 97.2 96.9 97.0 96.4  PLT 26* 35* 35* 41*   Basic Metabolic Panel: Recent Labs  Lab 08/12/23 1745 08/13/23 0531 08/14/23 0920 08/15/23 0526  NA 137 141 135 136  K 3.7 3.3* 4.2 4.7  CL 95* 99 95* 98  CO2 27 28 26 25   GLUCOSE 162* 108* 202* 246*  BUN 24* 34* 68* 97*  CREATININE 4.83* 6.06* 8.99* 9.97*  CALCIUM  9.4 9.0 9.0 8.7*  PHOS  --   --   --  6.3*   GFR: Estimated Creatinine Clearance: 7.6 mL/min (A) (by C-G formula based on SCr of 9.97 mg/dL (H)). Liver Function Tests: No results for input(s): "AST", "ALT", "ALKPHOS", "BILITOT", "PROT", "ALBUMIN" in the last 168 hours. No results for input(s): "LIPASE", "AMYLASE" in the last 168 hours. No results for input(s): "AMMONIA" in the last 168 hours. Coagulation Profile: No results for input(s):  "INR", "PROTIME" in the last 168 hours. Cardiac Enzymes: No results for input(s): "CKTOTAL", "CKMB", "CKMBINDEX", "TROPONINI" in the last 168 hours. BNP (last 3 results) No results for input(s): "PROBNP" in the last 8760 hours. HbA1C: No results for input(s): "HGBA1C" in the last 72 hours. CBG: No results for input(s): "GLUCAP" in the last 168 hours. Lipid Profile: No results for input(s): "CHOL", "HDL", "LDLCALC", "TRIG", "CHOLHDL", "LDLDIRECT" in the last 72 hours. Thyroid  Function Tests: No results for input(s): "TSH", "T4TOTAL", "FREET4", "T3FREE", "THYROIDAB" in the last 72 hours. Anemia Panel: No results for input(s): "VITAMINB12", "FOLATE", "FERRITIN", "TIBC", "IRON", "RETICCTPCT" in the last 72 hours. Sepsis Labs: No results for input(s): "PROCALCITON", "LATICACIDVEN" in the last 168 hours.  Recent Results (from the past 240 hours)  Urine Culture     Status: None   Collection Time: 08/12/23  6:33 PM   Specimen: Urine, Catheterized  Result Value Ref Range Status   Specimen Description   Final    URINE, CATHETERIZED Performed at [facility], [address] [place], [place] [address]    Special Requests   Final    NONE Performed at [facility], [address] [place], [place] [address]    Culture   Final    NO GROWTH Performed at [facility], [address] [place], [place] [address]    Report Status 08/14/2023 FINAL  Final         Radiology Studies: No results found.       Scheduled Meds:  calcium  acetate  1,334 mg Oral TID WC   levothyroxine   125 mcg Oral Q0600   melatonin  5 mg Oral QHS   predniSONE   80 mg Oral Q breakfast   valACYclovir   500 mg Oral Daily   Continuous Infusions:   LOS: 3 days       Alphonsus Jeans, MD [facility] Pager [phone]  If 7PM-7AM, please contact night-coverage [email or web address] 08/15/2023, 8:43 AM

## 2023-08-15 NOTE — Progress Notes (Signed)
 PT Cancellation Note  Patient Details Name: Tonya Myers MRN: [id number] DOB: [birth year]   Cancelled Treatment:    Reason Eval/Treat Not Completed: Patient at procedure or test/unavailable, will attempt to see pt at a future date/time as medically appropriate.    Lavenia Post PT, DPT 08/15/23, 1:39 PM

## 2023-08-16 DIAGNOSIS — D696 Thrombocytopenia, unspecified: Secondary | ICD-10-CM | POA: Diagnosis not present

## 2023-08-16 LAB — GLUCOSE, CAPILLARY
Glucose-Capillary: 216 mg/dL — ABNORMAL HIGH (ref 70–99)
Glucose-Capillary: 286 mg/dL — ABNORMAL HIGH (ref 70–99)
Glucose-Capillary: 445 mg/dL — ABNORMAL HIGH (ref 70–99)
Glucose-Capillary: 432 mg/dL — ABNORMAL HIGH (ref 70–99)

## 2023-08-16 LAB — CBC
HCT: 30.1 % — ABNORMAL LOW (ref 36.0–46.0)
Hemoglobin: 9.7 g/dL — ABNORMAL LOW (ref 12.0–15.0)
MCH: 30.2 pg (ref 26.0–34.0)
MCHC: 32.2 g/dL (ref 30.0–36.0)
MCV: 93.8 fL (ref 80.0–100.0)
Platelets: 47 10*3/uL — ABNORMAL LOW (ref 150–400)
RBC: 3.21 MIL/uL — ABNORMAL LOW (ref 3.87–5.11)
RDW: 20 % — ABNORMAL HIGH (ref 11.5–15.5)
WBC: 7.7 10*3/uL (ref 4.0–10.5)
nRBC: 0 % (ref 0.0–0.2)

## 2023-08-16 LAB — BASIC METABOLIC PANEL WITH GFR
Anion gap: 13 (ref 5–15)
BUN: 73 mg/dL — ABNORMAL HIGH (ref 6–20)
CO2: 26 mmol/L (ref 22–32)
Calcium: 8.8 mg/dL — ABNORMAL LOW (ref 8.9–10.3)
Chloride: 93 mmol/L — ABNORMAL LOW (ref 98–111)
Creatinine, Ser: 6.33 mg/dL — ABNORMAL HIGH (ref 0.44–1.00)
GFR, Estimated: 7 mL/min — ABNORMAL LOW (ref >60)
Glucose, Bld: 259 mg/dL — ABNORMAL HIGH (ref 70–99)
Potassium: 4.5 mmol/L (ref 3.5–5.1)
Sodium: 132 mmol/L — ABNORMAL LOW (ref 135–145)

## 2023-08-16 MED ORDER — POLYETHYLENE GLYCOL 3350 17 G PO PACK
17.0000 g | PACK | Freq: Every day | ORAL | Status: DC | PRN
  Administered 2023-08-18: 17 g via ORAL
  Filled 2023-08-16 (×2): qty 1

## 2023-08-16 MED ORDER — INSULIN ASPART 100 UNIT/ML IJ SOLN: 0.0000 [IU] | Freq: Every day | INTRAMUSCULAR | Status: DC

## 2023-08-16 MED ORDER — INSULIN GLARGINE-YFGN 100 UNIT/ML ~~LOC~~ SOLN
5.0000 [IU] | Freq: Two times a day (BID) | SUBCUTANEOUS | Status: DC
Start: 2023-08-16 — End: 2023-08-17
  Administered 2023-08-16: 5 [IU] via SUBCUTANEOUS
  Filled 2023-08-16 (×2): qty 0.05

## 2023-08-16 MED ORDER — INSULIN ASPART 100 UNIT/ML IJ SOLN
0.0000 [IU] | INTRAMUSCULAR | Status: DC
  Administered 2023-08-16: 20 [IU] via SUBCUTANEOUS
  Administered 2023-08-17: 15 [IU] via SUBCUTANEOUS
  Administered 2023-08-17: 4 [IU] via SUBCUTANEOUS
  Administered 2023-08-17 (×2): 20 [IU] via SUBCUTANEOUS
  Administered 2023-08-18 (×2): 7 [IU] via SUBCUTANEOUS
  Filled 2023-08-16 (×8): qty 1

## 2023-08-16 MED ORDER — INSULIN GLARGINE-YFGN 100 UNIT/ML ~~LOC~~ SOLN
5.0000 [IU] | Freq: Every day | SUBCUTANEOUS | Status: DC
  Administered 2023-08-16: 5 [IU] via SUBCUTANEOUS
  Filled 2023-08-16: qty 0.05

## 2023-08-16 MED ORDER — INSULIN GLARGINE-YFGN 100 UNIT/ML ~~LOC~~ SOLN: 5.0000 [IU] | Freq: Two times a day (BID) | SUBCUTANEOUS | Status: DC

## 2023-08-16 MED ORDER — DOCUSATE SODIUM 100 MG PO CAPS
200.0000 mg | ORAL_CAPSULE | Freq: Two times a day (BID) | ORAL | Status: DC
  Administered 2023-08-16 – 2023-08-18 (×4): 200 mg via ORAL
  Filled 2023-08-16 (×5): qty 2

## 2023-08-16 MED ORDER — INSULIN ASPART 100 UNIT/ML IJ SOLN
0.0000 [IU] | Freq: Three times a day (TID) | INTRAMUSCULAR | Status: DC
  Administered 2023-08-16: 6 [IU] via SUBCUTANEOUS
  Administered 2023-08-16: 3 [IU] via SUBCUTANEOUS
  Filled 2023-08-16 (×2): qty 1

## 2023-08-16 NOTE — Inpatient Diabetes Management (Signed)
 Inpatient Diabetes Program Recommendations  AACE/ADA: New Consensus Statement on Inpatient Glycemic Control  Target Ranges:  Prepandial:   less than 140 mg/dL      Peak postprandial:   less than 180 mg/dL (1-2 hours)      Critically ill patients:  140 - 180 mg/dL    Latest Reference Range & Units 08/15/23 16:33 08/15/23 21:00 08/16/23 07:56  Glucose-Capillary 70 - 99 mg/dL 782 (H) 956 (H) 213 (H)    Latest Reference Range & Units 08/12/23 17:45 08/13/23 05:31 08/14/23 09:20 08/15/23 05:26 08/16/23 05:33  Glucose 70 - 99 mg/dL 086 (H) 578 (H) 469 (H) 246 (H) 259 (H)    Latest Reference Range & Units 07/29/23 23:58  Hemoglobin A1C 4.8 - 5.6 % 5.6   Review of Glycemic Control  Diabetes history: DM2 Outpatient Diabetes medications: None; Dexcom G6  Current orders for Inpatient glycemic control: Novolog  0-6 units TID with meals; Prednisone  80 mg QAM  Inpatient Diabetes Program Recommendations:    Insulin : Please consider adding Novolog  0-5 units at bedtime. If steroids will be continued as ordered please consider ordering Semglee  5 units Q24H.  Thanks, Beacher Limerick, RN, MSN, CDCES Diabetes Coordinator Inpatient Diabetes Program [phone] (Team Pager from 8am to 5pm)

## 2023-08-16 NOTE — Progress Notes (Signed)
 Physical Therapy Treatment Patient Details Name: Tonya Myers MRN: [id number] DOB: [birth year] Today's Date: 08/16/2023   History of Present Illness Pt presented to the ER secondary to a fall in the home environment; admitted for management of thrombocytopenia.  Also noted with small minimally displaced fracture fragment along the dorsal lateral aspect of the left patella; WBAT, KI for comfort per MD.  Of note, additional recent hospitalizations (4/4-4/9) for presyncope and (4/10-4/13) for weakness, thrombocytopenia    PT Comments  Pt was pleasant and motivated to participate during the session and put forth good effort throughout. Pt reported feeling subjectively that her LLE/knee felt "weak" and that she was worried that it could buckle on her.  Pt education provided on proper step-to sequencing with a RW for increased safety with ambulation.  Pt required mod multi-modal cuing initially for proper sequencing but improved quickly to where she only needed rare min cues for compliance.  Pt was generally steady with gait but with noted heavy lean on the RW at times to take weight off of her LLE during SLS phase.  Pt required extra time and effort to come to standing from an elevated EOB and presented with poor eccentric control during the last third of her stand to sit.  Pt remains at an elevated risk for falls and will benefit from continued PT services upon discharge to safely address deficits listed in patient problem list for decreased caregiver assistance and eventual return to PLOF.      If plan is discharge home, recommend the following: Assist for transportation;Assistance with cooking/housework;Help with stairs or ramp for entrance;A little help with walking and/or transfers;A little help with bathing/dressing/bathroom   Can travel by private vehicle     Yes  Equipment Recommendations  Rolling walker (2 wheels);BSC/3in1    Recommendations for Other Services       Precautions /  Restrictions Precautions Precautions: Fall Recall of Precautions/Restrictions: Intact Restrictions Weight Bearing Restrictions Per Provider Order: Yes LLE Weight Bearing Per Provider Order: Weight bearing as tolerated Other Position/Activity Restrictions: LLE KI PRN for comfort     Mobility  Bed Mobility Overal bed mobility: Modified Independent             General bed mobility comments: Min increased time and effort only    Transfers Overall transfer level: Needs assistance Equipment used: Rolling walker (2 wheels) Transfers: Sit to/from Stand Sit to Stand: Contact guard assist, From elevated surface           General transfer comment: Pt required increased bed height to come to standing along with rocking momentum and extra effort to come to full upright position    Ambulation/Gait Ambulation/Gait assistance: Contact guard assist Gait Distance (Feet): 50 Feet x 2 with step-to sequencing, 50 Feet x 1 with beginning step-through pattern Assistive device: Rolling walker (2 wheels) Gait Pattern/deviations: Step-to pattern, Step-through pattern, Trunk flexed, Decreased step length - right, Decreased stance time - left       General Gait Details: Pt reported feeling "weak" in her LLE with step-to gait pattern sequencing education provided for increased safety; pt performed multiple bouts of amb with step-to sequencing with standing therapeutic rest breaks between bouts and one bout of beginning step-through pattern but with constant UE lean on the RW for support   Stairs             Wheelchair Mobility     Tilt Bed    Modified Rankin (Stroke Patients Only)  Balance Overall balance assessment: Needs assistance Sitting-balance support: No upper extremity supported, Feet supported Sitting balance-Leahy Scale: Good     Standing balance support: Bilateral upper extremity supported, During functional activity Standing balance-Leahy Scale: Fair                               Hotel manager: No apparent difficulties  Cognition Arousal: Alert Behavior During Therapy: WFL for tasks assessed/performed   PT - Cognitive impairments: No apparent impairments                       PT - Cognition Comments: pleasant and agreeable, eager for OOB Following commands: Intact      Cueing Cueing Techniques: Verbal cues, Visual cues  Exercises      General Comments        Pertinent Vitals/Pain Pain Assessment Pain Assessment: No/denies pain    Home Living                          Prior Function            PT Goals (current goals can now be found in the care plan section) Progress towards PT goals: Progressing toward goals    Frequency    Min 2X/week      PT Plan      Co-evaluation              AM-PAC PT "6 Clicks" Mobility   Outcome Measure  Help needed turning from your back to your side while in a flat bed without using bedrails?: None Help needed moving from lying on your back to sitting on the side of a flat bed without using bedrails?: None Help needed moving to and from a bed to a chair (including a wheelchair)?: A Little Help needed standing up from a chair using your arms (e.g., wheelchair or bedside chair)?: A Little Help needed to walk in hospital room?: A Little Help needed climbing 3-5 steps with a railing? : A Lot 6 Click Score: 19    End of Session Equipment Utilized During Treatment: Gait belt Activity Tolerance: Patient tolerated treatment well Patient left: in bed;with family/visitor present (Pt left sitting at EOB with nursing for meds with nursing stating she would get pt set back up in bed once finished) Nurse Communication: Mobility status PT Visit Diagnosis: Muscle weakness (generalized) (M62.81);History of falling (Z91.81);Unsteadiness on feet (R26.81);Difficulty in walking, not elsewhere classified (R26.2)     Time:  1610-9604 PT Time Calculation (min) (ACUTE ONLY): 21 min  Charges:    $Gait Training: 8-22 mins PT General Charges $$ ACUTE PT VISIT: 1 Visit                     D. Scott Aneesa Romey PT, DPT 08/16/23, 2:27 PM

## 2023-08-16 NOTE — Progress Notes (Signed)
 [facility]  ROUNDING NOTE   Subjective:   Tonya Myers is a 58 year old female with past medical history including depression, type 2 diabetes, asthma, hypertension, hypothyroidism, CLL, and end-stage renal disease on hemodialysis.  She presents to the emergency department with multiple falls. She has been admitted for Thrombocytopenia (HCC) [D69.6] Dyslipidemia [E78.5] Acute cystitis with hematuria [N30.01] Multiple falls [R29.6]  Patient is known our practice and receives outpatient dialysis treatments at [facility] on a MWF schedule, supervised by [facility] physicians.   Update: Patient laying in bed Alert and oriented Tolerating meals  Awaiting discharge.    Objective:  Vital signs in last 24 hours:  Temp:  [97.9 F (36.6 C)-98.3 F (36.8 C)] 98.1 F (36.7 C) (04/22 0754) Pulse Rate:  [44-76] 53 (04/22 0754) Resp:  [11-20] 20 (04/22 0754) BP: (107-139)/(64-91) 111/74 (04/22 0754) SpO2:  [92 %-100 %] 100 % (04/22 0754) Weight:  [88.9 kg] 88.9 kg (04/21 1430)  Weight change:  Filed Weights   08/14/23 0444 08/15/23 1025 08/15/23 1430  Weight: 89.7 kg 90.9 kg 88.9 kg    Intake/Output: I/O last 3 completed shifts: In: 0  Out: 2000 [Other:2000]   Intake/Output this shift:  No intake/output data recorded.  Physical Exam: General: NAD  Head: Normocephalic, atraumatic. Moist oral mucosal membranes  Eyes: Anicteric  Lungs:  Clear to auscultation, normal effort  Heart: Regular rate and rhythm  Abdomen:  Soft, nontender  Extremities: No peripheral edema.  Neurologic: Alert and oriented moving all four extremities  Skin: No lesions  Access: Lt AVG    Basic Metabolic Panel: Recent Labs  Lab 08/12/23 1745 08/13/23 0531 08/14/23 0920 08/15/23 0526 08/16/23 0533  NA 137 141 135 136 132*  K 3.7 3.3* 4.2 4.7 4.5  CL 95* 99 95* 98 93*  CO2 27 28 26 25 26   GLUCOSE 162* 108* 202* 246* 259*  BUN 24* 34* 68* 97* 73*  CREATININE 4.83*  6.06* 8.99* 9.97* 6.33*  CALCIUM  9.4 9.0 9.0 8.7* 8.8*  PHOS  --   --   --  6.3*  --     Liver Function Tests: No results for input(s): "AST", "ALT", "ALKPHOS", "BILITOT", "PROT", "ALBUMIN" in the last 168 hours.  No results for input(s): "LIPASE", "AMYLASE" in the last 168 hours.  No results for input(s): "AMMONIA" in the last 168 hours.   CBC: Recent Labs  Lab 08/12/23 1745 08/13/23 0531 08/14/23 0920 08/15/23 0526 08/16/23 0533  WBC 2.5* 2.7* 4.8 7.4 7.7  HGB 9.6* 9.9* 10.0* 9.5* 9.7*  HCT 30.7* 30.8* 31.9* 29.5* 30.1*  MCV 97.2 96.9 97.0 96.4 93.8  PLT 26* 35* 35* 41* 47*    Cardiac Enzymes: No results for input(s): "CKTOTAL", "CKMB", "CKMBINDEX", "TROPONINI" in the last 168 hours.  BNP: Invalid input(s): "POCBNP"  CBG: Recent Labs  Lab 08/15/23 1633 08/15/23 2100 08/16/23 0756  GLUCAP 294* 404* 216*     Microbiology: Results for orders placed or performed during the hospital encounter of 08/12/23  Urine Culture     Status: None   Collection Time: 08/12/23  6:33 PM   Specimen: Urine, Catheterized  Result Value Ref Range Status   Specimen Description   Final    URINE, CATHETERIZED Performed at [facility], [address] [place], [place] [address]    Special Requests   Final    NONE Performed at [facility], [address], [place], [place] [address]    Culture   Final    NO  GROWTH Performed at [facility], [address] [place], [place] [address]    Report Status 08/14/2023 FINAL  Final    Coagulation Studies: No results for input(s): "LABPROT", "INR" in the last 72 hours.  Urinalysis: No results for input(s): "COLORURINE", "LABSPEC", "PHURINE", "GLUCOSEU", "HGBUR", "BILIRUBINUR", "KETONESUR", "PROTEINUR", "UROBILINOGEN", "NITRITE", "LEUKOCYTESUR" in the last 72 hours.  Invalid input(s): "APPERANCEUR"     Imaging: No results found.     Medications:       calcium  acetate  1,334 mg Oral TID WC    insulin  aspart  0-5 Units Subcutaneous QHS   insulin  aspart  0-6 Units Subcutaneous TID WC   insulin  glargine-yfgn  5 Units Subcutaneous Daily   levothyroxine   125 mcg Oral Q0600   melatonin  5 mg Oral QHS   predniSONE   80 mg Oral Q breakfast   valACYclovir   500 mg Oral Daily   acetaminophen  **OR** acetaminophen , diphenhydrAMINE , HYDROmorphone  (DILAUDID ) injection, ondansetron  **OR** ondansetron  (ZOFRAN ) IV, oxyCODONE   Assessment/ Plan:  [name] is a 58 y.o.  female  with past medical history including depression, type 2 diabetes, asthma, hypertension, hypothyroidism, CLL, and end-stage renal disease on hemodialysis.  She presents to the emergency department with fever and diarrhea. She has been admitted under observation for Thrombocytopenia (HCC) [D69.6] Dyslipidemia [E78.5] Acute cystitis with hematuria [N30.01] Multiple falls [R29.6]   [facility]/MWF/right AVF/91 kg   End stage renal disease on hemodialysis.  Next treatment scheduled for Wednesday.  2. 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  Lab Results  Component Value Date   HGB 9.7 (L) 08/16/2023    Hemoglobin 9.7.  She receives Mircera as outpatient. Will continue to monitor for need of ESA.   3. Secondary Hyperparathyroidism: with outpatient labs: PTH 223, phosphorus 6.5, calcium  9.8 on 06/27/23.   Lab Results  Component Value Date   CALCIUM  8.8 (L) 08/16/2023   PHOS 6.3 (H) 08/15/2023  Currently prescribed calcitriol and calcium  acetate outpatient.  Phosphorus slightly high at 6.3 which is not unexpected for a Monday.  Maintain the patient on calcium  acetate 2 tablets p.o. 3 times daily with meals. Continue to monitor bone minerals.   4. 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 Most recent hemoglobin A1c is 5.6 on 07/29/23.     LOS: 4 Yoshino Broccoli 4/22/202510:55 AM

## 2023-08-16 NOTE — Plan of Care (Signed)

## 2023-08-16 NOTE — NC FL2 (Signed)
 [place]  MEDICAID FL2 LEVEL OF CARE FORM     IDENTIFICATION  Patient Name: Francely Craw Birthdate: Aug 22, 1965 Sex: female Admission Date (Current Location): 08/12/2023  [facility] and [place] Number:  Chiropodist and Address:  [facility], [address], [place], [place] [address]      Provider Number: [id number]  Attending Physician Name and Address:  Alphonsus Jeans, MD  Relative Name and Phone Number:  Stana, Bayon (Daughter)  [phone] (Mobile)    Current Level of Care: Hospital Recommended Level of Care: Skilled Nursing Facility Prior Approval Number:    Date Approved/Denied:   PASRR Number: [id number]  Discharge Plan:      Current Diagnoses: Patient Active Problem List   Diagnosis Date Noted   Failure to thrive in adult/frailty 08/13/2023   Left patella fracture 08/13/2023   Chest pain 07/30/2023   Near syncope 07/30/2023   ESRD (end stage renal disease) (HCC) 07/30/2023   Peripheral neuropathy 07/30/2023   Diarrhea 07/25/2023   Dysarthria 03/28/2023   Uremia 03/28/2023   Stroke (HCC) 03/28/2023   Sacral decubitus ulcer, stage IV (HCC) 12/23/2021   Protein-calorie malnutrition, moderate (HCC) 12/17/2021   History of herpes genitalis 12/14/2021   Sepsis (HCC) 12/12/2021   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   Closed fracture of sternum 12/12/2021   Chronic pancreatitis (HCC) 12/12/2021   Asthma 12/12/2021   Anemia in ESRD (end-stage renal disease) (HCC) 12/12/2021   Type II diabetes mellitus with renal manifestations (HCC) 12/12/2021   UTI (urinary tract infection) 12/12/2021   Pressure injury of skin 12/11/2021   Elevated troponin 12/10/2021   Syncope 12/09/2021   Dyslipidemia 12/09/2021   Type 2 diabetes mellitus with peripheral neuropathy (HCC) 12/09/2021   GERD without esophagitis 12/09/2021   End-stage renal disease on hemodialysis (HCC) 12/09/2021   Thrombocytopenia (HCC) 12/11/2020   FUO (fever  of unknown origin) 12/11/2020   Pancytopenia (HCC) 12/11/2020   Oral thrush 12/11/2020   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HCC) 12/10/2020   Right arm weakness 10/21/2020   Quadriplegia (HCC) 10/10/2020   Wheelchair dependence 10/10/2020   Transient neurologic deficit 12/15/21 10/02/2020   Functional paraparesis 10/02/2020   Left upper quadrant abdominal pain    Pain    Chronic pain of both knees    Hypokalemia    Urinary retention    Slow transit constipation    Meningitis    Labile blood glucose    Uncontrolled type 2 diabetes mellitus with hyperosmolar nonketotic hyperglycemia (HCC)    Transaminitis    Sleep disturbance    Anemia of chronic disease    Bacterial spinal epidural abscess 06/27/2020   Obesity (BMI 30.0-34.9)    Hypomagnesemia    Hypernatremia    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HCC)    Epidural abscess    Pneumococcal meningitis    Hyperglycemic crisis in diabetes mellitus (HCC) 06/10/2020   Acute metabolic encephalopathy 06/10/2020   Severe sepsis (HCC)    Diabetic ketoacidosis without coma associated with type 2 diabetes mellitus (HCC)    AKI (acute kidney injury) (HCC)    Elevated LFTs    Acute encephalopathy 06/09/2020   Bacterial vaginosis 11/01/2016   Chronic right shoulder pain 03/23/2016   Numbness and tingling 03/23/2016   Uncontrolled type 2 diabetes mellitus with hyperglycemia, without long-term current use of insulin  (HCC) 10/27/2015   DDD (degenerative disc disease), cervical 07/29/2015   Cervical disc disorder with radiculopathy of cervical region 07/29/2015   Cervical facet syndrome 07/29/2015   DDD (degenerative disc disease), lumbar 07/29/2015  Facet syndrome, lumbar 07/29/2015   Bilateral occipital neuralgia 07/29/2015   Sacroiliac joint dysfunction 07/29/2015   DJD of shoulder 07/29/2015   Herpes simplex vulvovaginitis 05/29/2015   Acute hemorrhoid 03/11/2015   Personal history of CLL (chronic lymphocytic leukemia) 01/15/2015   Hypothyroidism 12/26/2014    Arthritis 12/26/2014   Carpal tunnel syndrome, bilateral 12/26/2014   Controlled type 2 diabetes mellitus without complication, without long-term current use of insulin  (HCC) 12/26/2014   Essential hypertension 12/26/2014   Hyperlipidemia, mixed 12/26/2014    Orientation RESPIRATION BLADDER Height & Weight     Self, Time, Situation, Place  O2 (022L)   Weight: 88.9 kg Height:  6' (182.9 cm)  BEHAVIORAL SYMPTOMS/MOOD NEUROLOGICAL BOWEL NUTRITION STATUS  Other (Comment) (n/a)  (n/a) Continent Diet (Heart)  AMBULATORY STATUS COMMUNICATION OF NEEDS Skin   Limited Assist Verbally Normal                       Personal Care Assistance Level of Assistance  Bathing, Feeding, Dressing Bathing Assistance: Limited assistance Feeding assistance: Limited assistance Dressing Assistance: Limited assistance     Functional Limitations Info  Sight Sight Info: Adequate        SPECIAL CARE FACTORS FREQUENCY  PT (By licensed PT), OT (By licensed OT)     PT Frequency: Min 2x weekly OT Frequency: Min 2x weekly            Contractures Contractures Info: Not present    Additional Factors Info  Code Status Code Status Info: FULL             Current Medications (08/16/2023):  This is the current hospital active medication list Current Facility-Administered Medications  Medication Dose Route Frequency Provider Last Rate Last Admin   acetaminophen  (TYLENOL ) tablet 650 mg  650 mg Oral Q6H PRN Duncan, Hazel V, MD       Or   acetaminophen  (TYLENOL ) suppository 650 mg  650 mg Rectal Q6H PRN Duncan, Hazel V, MD       calcium  acetate (PHOSLO ) capsule 1,334 mg  1,334 mg Oral TID WC Alphonsus Jeans, MD   1,334 mg at 08/16/23 0851   diphenhydrAMINE  (BENADRYL ) capsule 50 mg  50 mg Oral Q6H PRN Alphonsus Jeans, MD   50 mg at 08/15/23 7846   HYDROmorphone  (DILAUDID ) injection 0.5-1 mg  0.5-1 mg Intravenous Q2H PRN Duncan, Hazel V, MD   1 mg at 08/15/23 2120   insulin  aspart (novoLOG )  injection 0-5 Units  0-5 Units Subcutaneous QHS Alphonsus Jeans, MD       insulin  aspart (novoLOG ) injection 0-6 Units  0-6 Units Subcutaneous TID WC Williams, Jamiese M, MD       insulin  glargine-yfgn (SEMGLEE ) injection 5 Units  5 Units Subcutaneous Daily Alphonsus Jeans, MD       levothyroxine  (SYNTHROID ) tablet 125 mcg  125 mcg Oral Q0600 Alphonsus Jeans, MD   125 mcg at 08/16/23 0603   melatonin tablet 5 mg  5 mg Oral QHS Ree Candy, MD   5 mg at 08/15/23 2121   ondansetron  (ZOFRAN ) tablet 4 mg  4 mg Oral Q6H PRN Duncan, Hazel V, MD       Or   ondansetron  (ZOFRAN ) injection 4 mg  4 mg Intravenous Q6H PRN Duncan, Hazel V, MD   4 mg at 08/12/23 2220   oxyCODONE  (Oxy IR/ROXICODONE ) immediate release tablet 5 mg  5 mg Oral Q4H PRN Duncan, Hazel V, MD   5  mg at 08/14/23 2031   predniSONE  (DELTASONE ) tablet 80 mg  80 mg Oral Q breakfast Finnegan, Timothy J, MD   80 mg at 08/16/23 5784   valACYclovir  (VALTREX ) tablet 500 mg  500 mg Oral Daily Williams, Jamiese M, MD   500 mg at 08/16/23 6962     Discharge Medications: Please see discharge summary for a list of discharge medications.  Relevant Imaging Results:  Relevant Lab Results:   Additional Information SS#: [id number]. HD MWF [facility] 11:55 am.  Thang Flett C Teaghan Formica, RN

## 2023-08-16 NOTE — TOC Progression Note (Signed)
 Transition of Care [facility]) - Progression Note    Patient Details  Name: Tonya Myers MRN: [id number] Date of Birth: 05-21-1965  [facility] [name] Contact  Baird Bombard, RN Phone Number: 08/16/2023, 11:39 AM  Clinical Narrative:    Spoke with patient at bedside regarding therapy's recommendation for SNF. She is agreeable to SNF and would like [facility]. She has concerns about not being transported to dialysis. Patient states on last admission to [facility] her transportation ran out at 12 trips. Patient advised facility assumes her care once accepted and will transport her to dialysis.         Expected Discharge Plan and Services                                               Social Determinants of Health (SDOH) Interventions SDOH Screenings   Food Insecurity: No Food Insecurity (08/12/2023)  Recent Concern: Food Insecurity - Food Insecurity Present (07/06/2023)   Received from [facility]  Housing: Low Risk  (08/12/2023)  Recent Concern: Housing - High Risk (07/06/2023)   Received from [facility]  Transportation Needs: No Transportation Needs (08/12/2023)  Recent Concern: Transportation Needs - Unmet Transportation Needs (07/06/2023)   Received from [facility]  Utilities: Not At Risk (08/12/2023)  Depression (PHQ2-9): Low Risk  (10/29/2020)  Financial Resource Strain: High Risk (07/06/2023)   Received from [facility]  Social Connections: Unknown (08/12/2023)  Tobacco Use: Low Risk  (08/12/2023)    Readmission Risk Interventions    12/24/2021   12:03 PM 11/24/2020    2:17 PM  Readmission Risk Prevention Plan  Transportation Screening Complete Complete  PCP or Specialist Appt within 3-5 Days  Complete  HRI or Home Care Consult  Complete  Social Work Consult for Recovery Care Planning/Counseling  Complete  Palliative Care Screening  Not Applicable  Medication Review Special educational needs teacher) Complete Complete  PCP or Specialist appointment within 3-5 days of discharge Complete   HRI or Home Care Consult Complete   SW Recovery Care/Counseling Consult Complete   Palliative Care Screening Not Applicable   Skilled Nursing Facility Complete

## 2023-08-16 NOTE — Progress Notes (Signed)
 PROGRESS NOTE   HPI was taken from Dr. Vallarie Gauze: Tonya Myers is a 58 y.o. 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 (~2022) and protracted illness course with multiple admissions for encephalopathy and sepsis, now functionally much improved though with chronic neuropathy admitted to [facility] 4/4-4/9 for presyncope attributed to orthostatic hypotension discharged home with home health on midodrine , rehospitalized at [facility] 4/10-4/13 with weakness and thrombocytopenia, following up with hematology on 4/18, now sent from hematology office for inability to function at home in the setting of worsening thrombocytopenia for which she was started on a 7-day prednisone  burst. Patient has multiple vague complaints mostly notable for bilateral knee pain.  Oncology note reviewed.  Patient had a bone marrow biopsy on 4/9 that was unrevealing. ED course and data review: Vitals within normal limits Labs notable for pancytopenia with stable thrombocytopenia of 26,000.  Urinalysis with moderate leukocyte esterase EKG sinus at 82 with no ischemic changes X-rays bilateral knees showed a left patella fracture   Patient placed in a knee immobilizer Started on ceftriaxone  for possible UTI Treated with pain medication   Hospitalist consulted for admission.      Tonya Myers  ZOX:[id number] DOB: 1965/12/16 DOA: 08/12/2023 PCP: Lyle San, MD   Assessment & Plan:   Principal Problem:   Thrombocytopenia (HCC) Active Problems:   Failure to thrive in adult/frailty   Type 2 diabetes mellitus with peripheral neuropathy (HCC)   End-stage renal disease on hemodialysis (HCC)   Left patella fracture   Type II diabetes mellitus with renal manifestations (HCC)   Anemia in ESRD (end-stage renal disease) (HCC)  Assessment and  Plan: Thrombocytopenia: etiology unclear. Trending up again today. Continue on steroids x 7 days as per heme. Recent bone marrow biopsy was unremarkable. Will continue to monitor   Generalized weakness: PT/OT recs SNF. Pt is agreeable to SNF. CM is working on SNF placement. Multiple recent hospitalizations  ESRD:  on HD MWF. Nephro following and recs apprec   ACD: likely secondary to ESRD. Will transfuse if Hb < 7.0   Hypokalemia: WNL today   Left patella fracture: continue w/ knee immobilizer prn for comfort. Weightbearing as tolerated. F/u outpatient w/ ortho surg  Orthostatic hypotension: continue on midodrine    Hx of CLL: dx 2016. Recent bone marrow biopsy on 08/03/23 unremarkable  Possible UTI: urine cx shows no growth. Completed 3 day course of abxs   HSV: w/ acute flare. Continue on valtrex    Possible vaginal yeast infection: s/p fluconazole  x 2   Hypothyroidism: continue on levothyroxine    DM2: HbA1c 5.4, well controlled. Continue on SSI w/ accuchecks while on steroids   Hx of seizures: pt stopped taking vimpat  in 2024 herself     DVT prophylaxis: SCDs secondary to significant thrombocytopenia  Code Status: full Family Communication:  Disposition Plan: likely d/c to SNF   Level of care: Progressive  Status is: Inpatient Remains inpatient appropriate because: medically stable. Waiting on SNF placement     Consultants:  Nephro   Procedures:   Antimicrobials:   Subjective: Pt c/o generalized weakness   Objective: Vitals:   08/15/23 2057 08/16/23 0001 08/16/23 0357 08/16/23 0754  BP: 131/68 132/72 139/75 111/74  Pulse: 76 70 (!) 51 (!) 53  Resp: 18 20 18 20   Temp: 98.3 F (36.8 C) 98.1 F (36.7 C) 98.3 F (36.8 C)  98.1 F (36.7 C)  TempSrc:  Oral    SpO2: 100% 100% 98% 100%  Weight:      Height:        Intake/Output Summary (Last 24 hours) at 08/16/2023 0838 Last data filed at 08/15/2023 1900 Gross per 24 hour  Intake 0 ml  Output 2000 ml   Net -2000 ml   Filed Weights   08/14/23 0444 08/15/23 1025 08/15/23 1430  Weight: 89.7 kg 90.9 kg 88.9 kg    Examination:  General exam: Appears comfortable  Respiratory system: decreased breath sounds b/l otherwise clear  Cardiovascular system: S1/S2+. No rubs or gallops  Gastrointestinal system: abd is soft, NT, ND & normal bowel sounds  Central nervous system: alert & oriented. Moves all extremities  Psychiatry: Judgement and insight appears at baseline. Flat mood and affect    Data Reviewed: I have personally reviewed following labs and imaging studies  CBC: Recent Labs  Lab 08/12/23 1745 08/13/23 0531 08/14/23 0920 08/15/23 0526 08/16/23 0533  WBC 2.5* 2.7* 4.8 7.4 7.7  HGB 9.6* 9.9* 10.0* 9.5* 9.7*  HCT 30.7* 30.8* 31.9* 29.5* 30.1*  MCV 97.2 96.9 97.0 96.4 93.8  PLT 26* 35* 35* 41* 47*   Basic Metabolic Panel: Recent Labs  Lab 08/12/23 1745 08/13/23 0531 08/14/23 0920 08/15/23 0526 08/16/23 0533  NA 137 141 135 136 132*  K 3.7 3.3* 4.2 4.7 4.5  CL 95* 99 95* 98 93*  CO2 27 28 26 25 26   GLUCOSE 162* 108* 202* 246* 259*  BUN 24* 34* 68* 97* 73*  CREATININE 4.83* 6.06* 8.99* 9.97* 6.33*  CALCIUM  9.4 9.0 9.0 8.7* 8.8*  PHOS  --   --   --  6.3*  --    GFR: Estimated Creatinine Clearance: 12 mL/min (A) (by C-G formula based on SCr of 6.33 mg/dL (H)). Liver Function Tests: No results for input(s): "AST", "ALT", "ALKPHOS", "BILITOT", "PROT", "ALBUMIN" in the last 168 hours. No results for input(s): "LIPASE", "AMYLASE" in the last 168 hours. No results for input(s): "AMMONIA" in the last 168 hours. Coagulation Profile: No results for input(s): "INR", "PROTIME" in the last 168 hours. Cardiac Enzymes: No results for input(s): "CKTOTAL", "CKMB", "CKMBINDEX", "TROPONINI" in the last 168 hours. BNP (last 3 results) No results for input(s): "PROBNP" in the last 8760 hours. HbA1C: No results for input(s): "HGBA1C" in the last 72 hours. CBG: Recent Labs   Lab 08/15/23 1633 08/15/23 2100 08/16/23 0756  GLUCAP 294* 404* 216*   Lipid Profile: No results for input(s): "CHOL", "HDL", "LDLCALC", "TRIG", "CHOLHDL", "LDLDIRECT" in the last 72 hours. Thyroid  Function Tests: No results for input(s): "TSH", "T4TOTAL", "FREET4", "T3FREE", "THYROIDAB" in the last 72 hours. Anemia Panel: No results for input(s): "VITAMINB12", "FOLATE", "FERRITIN", "TIBC", "IRON", "RETICCTPCT" in the last 72 hours. Sepsis Labs: No results for input(s): "PROCALCITON", "LATICACIDVEN" in the last 168 hours.  Recent Results (from the past 240 hours)  Urine Culture     Status: None   Collection Time: 08/12/23  6:33 PM   Specimen: Urine, Catheterized  Result Value Ref Range Status   Specimen Description   Final    URINE, CATHETERIZED Performed at [facility], [address] [place], [place] [address]    Special Requests   Final    NONE Performed at [facility], [address] [place], [place] [address]    Culture   Final    NO GROWTH Performed at [facility], [address], [place], [place] [address]  Report Status 08/14/2023 FINAL  Final         Radiology Studies: No results found.       Scheduled Meds:  calcium  acetate  1,334 mg Oral TID WC   fluconazole   150 mg Oral Daily   insulin  aspart  0-6 Units Subcutaneous TID WC   levothyroxine   125 mcg Oral Q0600   melatonin  5 mg Oral QHS   predniSONE   80 mg Oral Q breakfast   valACYclovir   500 mg Oral Daily   Continuous Infusions:   LOS: 4 days       Alphonsus Jeans, MD [facility] Pager [phone]  If 7PM-7AM, please contact night-coverage [email or web address] 08/16/2023, 8:38 AM

## 2023-08-16 NOTE — Plan of Care (Signed)

## 2023-08-16 NOTE — TOC Progression Note (Signed)
 Transition of Care [facility]) - Progression Note    Patient Details  Name: Tonya Myers MRN: [id number] Date of Birth: [birth year]  Transition of Care [facility] [name] Contact  Baird Bombard, RN Phone Number: 08/16/2023, 11:41 AM  Clinical Narrative:    FL2 completed.   Bed search started.   Spoke with Ivette Marks at [facility]. If referral accepted patient will be transported to her dialysis for duration of STR stay.         Expected Discharge Plan and Services                                               Social Determinants of Health (SDOH) Interventions SDOH Screenings   Food Insecurity: No Food Insecurity (08/12/2023)  Recent Concern: Food Insecurity - Food Insecurity Present (07/06/2023)   Received from [facility]  Housing: Low Risk  (08/12/2023)  Recent Concern: Housing - High Risk (07/06/2023)   Received from [facility]  Transportation Needs: No Transportation Needs (08/12/2023)  Recent Concern: Transportation Needs - Unmet Transportation Needs (07/06/2023)   Received from [facility]  Utilities: Not At Risk (08/12/2023)  Depression (PHQ2-9): Low Risk  (10/29/2020)  Financial Resource Strain: High Risk (07/06/2023)   Received from [facility]  Social Connections: Unknown (08/12/2023)  Tobacco Use: Low Risk  (08/12/2023)    Readmission Risk Interventions    12/24/2021   12:03 PM 11/24/2020    2:17 PM  Readmission Risk Prevention Plan  Transportation Screening Complete Complete  PCP or Specialist Appt within 3-5 Days  Complete  HRI or Home Care Consult  Complete  Social Work Consult for Recovery Care Planning/Counseling  Complete  Palliative Care Screening  Not Applicable  Medication Review Ocean[facility]) Complete Complete  PCP or Specialist appointment within 3-5 days of discharge Complete   HRI or Home Care Consult Complete   SW Recovery Care/Counseling Consult Complete    Palliative Care Screening Not Applicable   Skilled Nursing Facility Complete

## 2023-08-16 NOTE — Care Management Important Message (Signed)
 Important Message  Patient Details  Name: Tonya Myers MRN: [id number] Date of Birth: April 26, 1966   Important Message Given:  Yes - Medicare IM     Anise Kerns 08/16/2023, 10:19 AM

## 2023-08-17 DIAGNOSIS — D696 Thrombocytopenia, unspecified: Secondary | ICD-10-CM | POA: Diagnosis not present

## 2023-08-17 LAB — BASIC METABOLIC PANEL WITH GFR
Anion gap: 15 (ref 5–15)
BUN: 107 mg/dL — ABNORMAL HIGH (ref 6–20)
CO2: 21 mmol/L — ABNORMAL LOW (ref 22–32)
Calcium: 8.7 mg/dL — ABNORMAL LOW (ref 8.9–10.3)
Chloride: 96 mmol/L — ABNORMAL LOW (ref 98–111)
Creatinine, Ser: 8.01 mg/dL — ABNORMAL HIGH (ref 0.44–1.00)
GFR, Estimated: 5 mL/min — ABNORMAL LOW (ref >60)
Glucose, Bld: 214 mg/dL — ABNORMAL HIGH (ref 70–99)
Potassium: 4.5 mmol/L (ref 3.5–5.1)
Sodium: 132 mmol/L — ABNORMAL LOW (ref 135–145)

## 2023-08-17 LAB — GLUCOSE, CAPILLARY
Glucose-Capillary: 180 mg/dL — ABNORMAL HIGH (ref 70–99)
Glucose-Capillary: 205 mg/dL — ABNORMAL HIGH (ref 70–99)
Glucose-Capillary: 320 mg/dL — ABNORMAL HIGH (ref 70–99)
Glucose-Capillary: 356 mg/dL — ABNORMAL HIGH (ref 70–99)
Glucose-Capillary: 376 mg/dL — ABNORMAL HIGH (ref 70–99)
Glucose-Capillary: 393 mg/dL — ABNORMAL HIGH (ref 70–99)
Glucose-Capillary: 89 mg/dL (ref 70–99)

## 2023-08-17 LAB — CBC
HCT: 30.1 % — ABNORMAL LOW (ref 36.0–46.0)
Hemoglobin: 10 g/dL — ABNORMAL LOW (ref 12.0–15.0)
MCH: 31.1 pg (ref 26.0–34.0)
MCHC: 33.2 g/dL (ref 30.0–36.0)
MCV: 93.5 fL (ref 80.0–100.0)
Platelets: 48 10*3/uL — ABNORMAL LOW (ref 150–400)
RBC: 3.22 MIL/uL — ABNORMAL LOW (ref 3.87–5.11)
RDW: 19.6 % — ABNORMAL HIGH (ref 11.5–15.5)
WBC: 6 10*3/uL (ref 4.0–10.5)
nRBC: 0 % (ref 0.0–0.2)

## 2023-08-17 LAB — MAGNESIUM: Magnesium: 2.3 mg/dL (ref 1.7–2.4)

## 2023-08-17 MED ORDER — INSULIN ASPART 100 UNIT/ML IJ SOLN
4.0000 [IU] | Freq: Three times a day (TID) | INTRAMUSCULAR | Status: DC
  Administered 2023-08-17 – 2023-08-18 (×4): 4 [IU] via SUBCUTANEOUS
  Filled 2023-08-17 (×2): qty 1
  Filled 2023-08-17: qty 0.04
  Filled 2023-08-17 (×3): qty 1

## 2023-08-17 MED ORDER — INSULIN GLARGINE-YFGN 100 UNIT/ML ~~LOC~~ SOLN
10.0000 [IU] | Freq: Two times a day (BID) | SUBCUTANEOUS | Status: DC
  Administered 2023-08-17 – 2023-08-18 (×4): 10 [IU] via SUBCUTANEOUS
  Filled 2023-08-17 (×7): qty 0.1

## 2023-08-17 MED ORDER — PANTOPRAZOLE SODIUM 40 MG PO TBEC
40.0000 mg | DELAYED_RELEASE_TABLET | Freq: Every day | ORAL | Status: DC
  Administered 2023-08-17 – 2023-08-18 (×2): 40 mg via ORAL
  Filled 2023-08-17 (×2): qty 1

## 2023-08-17 MED ORDER — HYDROMORPHONE HCL 1 MG/ML IJ SOLN
INTRAMUSCULAR | Status: AC
  Filled 2023-08-17: qty 1

## 2023-08-17 MED ORDER — ALUM & MAG HYDROXIDE-SIMETH 200-200-20 MG/5ML PO SUSP
30.0000 mL | ORAL | Status: DC | PRN
  Administered 2023-08-17: 30 mL via ORAL
  Filled 2023-08-17: qty 30

## 2023-08-17 NOTE — Progress Notes (Signed)
 PROGRESS NOTE   Schuyler Behan  ZOX:[id number] DOB: 13-Mar-1966 DOA: 08/12/2023 PCP: Lyle San, MD    HPI was taken from Dr. Vallarie Gauze: Pattie Borders Jamey Demchak is a 58 y.o. 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 (~2022) and protracted illness course with multiple admissions for encephalopathy and sepsis, now functionally much improved though with chronic neuropathy admitted to [facility] 4/4-4/9 for presyncope attributed to orthostatic hypotension discharged home with home health on midodrine , rehospitalized at [facility] 4/10-4/13 with weakness and thrombocytopenia, following up with hematology on 4/18, now sent from hematology office for inability to function at home in the setting of worsening thrombocytopenia for which she was started on a 7-day prednisone  burst. Patient has multiple vague complaints mostly notable for bilateral knee pain.  Oncology note reviewed.  Patient had a bone marrow biopsy on 4/9 that was unrevealing. ED course and data review: Vitals within normal limits Labs notable for pancytopenia with stable thrombocytopenia of 26,000.  Urinalysis with moderate leukocyte esterase EKG sinus at 82 with no ischemic changes X-rays bilateral knees showed a left patella fracture   Patient placed in a knee immobilizer Started on ceftriaxone  for possible UTI Treated with pain medication   Hospitalist consulted for admission.     Assessment & Plan:   Principal Problem:   Thrombocytopenia (HCC) Active Problems:   Failure to thrive in adult/frailty   Type 2 diabetes mellitus with peripheral neuropathy (HCC)   End-stage renal disease on hemodialysis (HCC)   Left patella fracture   Type II diabetes mellitus with renal manifestations (HCC)   Anemia in ESRD (end-stage renal disease) (HCC)  Assessment and Plan:  #  Thrombocytopenia: etiology unclear. Trending up again today. Continue on steroids x 7 days as per heme. Recent bone marrow biopsy was unremarkable. Will continue to monitor  4/23 Plts 48k  Generalized weakness: PT/OT recs SNF. Pt is agreeable to SNF. CM is working on SNF placement. Multiple recent hospitalizations  ESRD:  on HD MWF. Nephro following and recs apprec   ACD: likely secondary to ESRD. Will transfuse if Hb < 7.0   Hypokalemia: WNL today   Left patella fracture: continue w/ knee immobilizer prn for comfort. Weightbearing as tolerated. F/u outpatient w/ ortho surg  Orthostatic hypotension: continue on midodrine    Hx of CLL: dx 2016. Recent bone marrow biopsy on 08/03/23 unremarkable  Possible UTI: urine cx shows no growth. Completed 3 day course of abxs   HSV: w/ acute flare. Continue on valtrex    Possible vaginal yeast infection: s/p fluconazole  x 2   Hypothyroidism: continue on levothyroxine    DM2: HbA1c 5.4, well controlled. Continue on SSI w/ accuchecks while on steroids   Hx of seizures: pt stopped taking vimpat  in 2024 herself    DVT prophylaxis: SCDs secondary to significant thrombocytopenia  Code Status: full Family Communication:  Disposition Plan: likely d/c to SNF   Level of care: Progressive  Status is: Inpatient Remains inpatient appropriate because: medically stable. Waiting on SNF placement     Consultants:  Nephro   Procedures:   Antimicrobials:   Subjective: No significant events overnight, patient was seen during hemodialysis, resting comfortably, denied any specific complaints. She is awaiting for SNF placement,  Objective: Vitals:   08/17/23 1230 08/17/23 1245 08/17/23 1326 08/17/23 1559  BP: 101/62 117/71  123/77  Pulse: 61 64  72  Resp:  14 10  20   Temp:  97.6 F (36.4 C)  98.4 F (36.9 C)  TempSrc:  Axillary    SpO2: 99% 99%  98%  Weight:   88.1 kg   Height:        Intake/Output Summary (Last 24 hours) at 08/17/2023  1659 Last data filed at 08/17/2023 1245 Gross per 24 hour  Intake 0 ml  Output 2000 ml  Net -2000 ml   Filed Weights   08/15/23 1430 08/17/23 0833 08/17/23 1326  Weight: 88.9 kg 90 kg 88.1 kg    Examination:  General exam: Appears comfortable  Respiratory system: decreased breath sounds b/l otherwise clear  Cardiovascular system: S1/S2+. No rubs or gallops  Gastrointestinal system: abd is soft, NT, ND & normal bowel sounds  Central nervous system: alert & oriented. Moves all extremities  Psychiatry: Judgement and insight appears at baseline. Flat mood and affect    Data Reviewed: I have personally reviewed following labs and imaging studies  CBC: Recent Labs  Lab 08/13/23 0531 08/14/23 0920 08/15/23 0526 08/16/23 0533 08/17/23 0535  WBC 2.7* 4.8 7.4 7.7 6.0  HGB 9.9* 10.0* 9.5* 9.7* 10.0*  HCT 30.8* 31.9* 29.5* 30.1* 30.1*  MCV 96.9 97.0 96.4 93.8 93.5  PLT 35* 35* 41* 47* 48*   Basic Metabolic Panel: Recent Labs  Lab 08/13/23 0531 08/14/23 0920 08/15/23 0526 08/16/23 0533 08/17/23 0535  NA 141 135 136 132* 132*  K 3.3* 4.2 4.7 4.5 4.5  CL 99 95* 98 93* 96*  CO2 28 26 25 26  21*  GLUCOSE 108* 202* 246* 259* 214*  BUN 34* 68* 97* 73* 107*  CREATININE 6.06* 8.99* 9.97* 6.33* 8.01*  CALCIUM  9.0 9.0 8.7* 8.8* 8.7*  MG  --   --   --   --  2.3  PHOS  --   --  6.3*  --   --    GFR: Estimated Creatinine Clearance: 9.4 mL/min (A) (by C-G formula based on SCr of 8.01 mg/dL (H)). Liver Function Tests: No results for input(s): "AST", "ALT", "ALKPHOS", "BILITOT", "PROT", "ALBUMIN" in the last 168 hours. No results for input(s): "LIPASE", "AMYLASE" in the last 168 hours. No results for input(s): "AMMONIA" in the last 168 hours. Coagulation Profile: No results for input(s): "INR", "PROTIME" in the last 168 hours. Cardiac Enzymes: No results for input(s): "CKTOTAL", "CKMB", "CKMBINDEX", "TROPONINI" in the last 168 hours. BNP (last 3 results) No results for input(s):  "PROBNP" in the last 8760 hours. HbA1C: No results for input(s): "HGBA1C" in the last 72 hours. CBG: Recent Labs  Lab 08/16/23 2102 08/17/23 0321 08/17/23 0919 08/17/23 1409 08/17/23 1621  GLUCAP 432* 393* 89 180* 320*   Lipid Profile: No results for input(s): "CHOL", "HDL", "LDLCALC", "TRIG", "CHOLHDL", "LDLDIRECT" in the last 72 hours. Thyroid  Function Tests: No results for input(s): "TSH", "T4TOTAL", "FREET4", "T3FREE", "THYROIDAB" in the last 72 hours. Anemia Panel: No results for input(s): "VITAMINB12", "FOLATE", "FERRITIN", "TIBC", "IRON", "RETICCTPCT" in the last 72 hours. Sepsis Labs: No results for input(s): "PROCALCITON", "LATICACIDVEN" in the last 168 hours.  Recent Results (from the past 240 hours)  Urine Culture     Status: None   Collection Time: 08/12/23  6:33 PM   Specimen: Urine, Catheterized  Result Value Ref Range Status   Specimen Description   Final    URINE, CATHETERIZED Performed at [facility], [address], [place], [place] [address]    Special Requests   Final    NONE Performed at [facility], [address], [place], [place] [address]    Culture   Final    NO GROWTH Performed at [facility], [address] [place], [place] [address]    Report Status 08/14/2023 FINAL  Final         Radiology Studies: No results found.    Scheduled Meds:  calcium  acetate  1,334 mg Oral TID WC   docusate sodium   200 mg Oral BID   insulin  aspart  0-20 Units Subcutaneous Q4H   insulin  aspart  4 Units Subcutaneous TID WC   insulin  glargine-yfgn  10 Units Subcutaneous BID   levothyroxine   125 mcg Oral Q0600   melatonin  5 mg Oral QHS   pantoprazole   40 mg Oral Daily   predniSONE   80 mg Oral Q breakfast   valACYclovir   500 mg Oral Daily   Continuous Infusions:   LOS: 5 days      Althia Atlas, MD [facility] Pager [phone]  If 7PM-7AM, please contact night-coverage [email or web address] 08/17/2023, 4:59 PM

## 2023-08-17 NOTE — Progress Notes (Signed)
 OT Cancellation Note  Patient Details Name: Tonya Myers MRN: [id number] DOB: 01-20-1966   Cancelled Treatment:    Reason Eval/Treat Not Completed: Patient at procedure or test/ unavailable. Pt off unit at time of treatment attempt, will reattmept on next available time/date.  Rosaria Common M.S. OTR/L  08/17/23, 12:40 PM

## 2023-08-17 NOTE — Progress Notes (Signed)
 [facility]  ROUNDING NOTE   Subjective:   Tonya Myers is a 58 year old female with past medical history including depression, type 2 diabetes, asthma, hypertension, hypothyroidism, CLL, and end-stage renal disease on hemodialysis.  She presents to the emergency department with multiple falls. She has been admitted for Thrombocytopenia (HCC) [D69.6] Dyslipidemia [E78.5] Acute cystitis with hematuria [N30.01] Multiple falls [R29.6]  Patient is known our practice and receives outpatient dialysis treatments at [facility] on a MWF schedule, supervised by [facility] physicians.   Update: Patient seen and evaluated during dialysis   HEMODIALYSIS FLOWSHEET:  Blood Flow Rate (mL/min): 399 mL/min Arterial Pressure (mmHg): -138.58 mmHg Venous Pressure (mmHg): 254.33 mmHg TMP (mmHg): 2.22 mmHg Ultrafiltration Rate (mL/min): 829 mL/min Dialysate Flow Rate (mL/min): 300 ml/min Dialysis Fluid Bolus: Normal Saline  Voices concerns that Prednisone  has increased her sugars.    Objective:  Vital signs in last 24 hours:  Temp:  [97.5 F (36.4 C)-98.4 F (36.9 C)] 97.7 F (36.5 C) (04/23 0836) Pulse Rate:  [42-59] 55 (04/23 0930) Resp:  [12-19] 13 (04/23 0930) BP: (125-154)/(66-93) 125/83 (04/23 0930) SpO2:  [95 %-100 %] 100 % (04/23 0930) Weight:  [90 kg] 90 kg (04/23 0833)  Weight change:  Filed Weights   08/15/23 1025 08/15/23 1430 08/17/23 0833  Weight: 90.9 kg 88.9 kg 90 kg    Intake/Output: I/O last 3 completed shifts: In: 240 [P.O.:240] Out: -    Intake/Output this shift:  No intake/output data recorded.  Physical Exam: General: NAD  Head: Normocephalic, atraumatic. Moist oral mucosal membranes  Eyes: Anicteric  Lungs:  Clear to auscultation, normal effort  Heart: Regular rate and rhythm  Abdomen:  Soft, nontender  Extremities: No peripheral edema.  Neurologic: Alert and oriented moving all four extremities  Skin: No lesions  Access: Lt  AVG    Basic Metabolic Panel: Recent Labs  Lab 08/13/23 0531 08/14/23 0920 08/15/23 0526 08/16/23 0533 08/17/23 0535  NA 141 135 136 132* 132*  K 3.3* 4.2 4.7 4.5 4.5  CL 99 95* 98 93* 96*  CO2 28 26 25 26  21*  GLUCOSE 108* 202* 246* 259* 214*  BUN 34* 68* 97* 73* 107*  CREATININE 6.06* 8.99* 9.97* 6.33* 8.01*  CALCIUM  9.0 9.0 8.7* 8.8* 8.7*  PHOS  --   --  6.3*  --   --     Liver Function Tests: No results for input(s): "AST", "ALT", "ALKPHOS", "BILITOT", "PROT", "ALBUMIN" in the last 168 hours.  No results for input(s): "LIPASE", "AMYLASE" in the last 168 hours.  No results for input(s): "AMMONIA" in the last 168 hours.   CBC: Recent Labs  Lab 08/13/23 0531 08/14/23 0920 08/15/23 0526 08/16/23 0533 08/17/23 0535  WBC 2.7* 4.8 7.4 7.7 6.0  HGB 9.9* 10.0* 9.5* 9.7* 10.0*  HCT 30.8* 31.9* 29.5* 30.1* 30.1*  MCV 96.9 97.0 96.4 93.8 93.5  PLT 35* 35* 41* 47* 48*    Cardiac Enzymes: No results for input(s): "CKTOTAL", "CKMB", "CKMBINDEX", "TROPONINI" in the last 168 hours.  BNP: Invalid input(s): "POCBNP"  CBG: Recent Labs  Lab 08/16/23 1249 08/16/23 1558 08/16/23 2102 08/17/23 0321 08/17/23 0919  GLUCAP 286* 445* 432* 393* 89     Microbiology: Results for orders placed or performed during the hospital encounter of 08/12/23  Urine Culture     Status: None   Collection Time: 08/12/23  6:33 PM   Specimen: Urine, Catheterized  Result Value Ref Range Status   Specimen Description   Final  URINE, CATHETERIZED Performed at [facility], [address], [place], [place] [address]    Special Requests   Final    NONE Performed at [facility], [address] [place], [place] [address]    Culture   Final    NO GROWTH Performed at [facility], [address] [place], [place] [address]    Report Status 08/14/2023 FINAL  Final    Coagulation Studies: No results for input(s): "LABPROT", "INR" in the last 72  hours.  Urinalysis: No results for input(s): "COLORURINE", "LABSPEC", "PHURINE", "GLUCOSEU", "HGBUR", "BILIRUBINUR", "KETONESUR", "PROTEINUR", "UROBILINOGEN", "NITRITE", "LEUKOCYTESUR" in the last 72 hours.  Invalid input(s): "APPERANCEUR"     Imaging: No results found.     Medications:       calcium  acetate  1,334 mg Oral TID WC   docusate sodium   200 mg Oral BID   insulin  aspart  0-20 Units Subcutaneous Q4H   insulin  aspart  4 Units Subcutaneous TID WC   insulin  glargine-yfgn  10 Units Subcutaneous BID   levothyroxine   125 mcg Oral Q0600   melatonin  5 mg Oral QHS   predniSONE   80 mg Oral Q breakfast   valACYclovir   500 mg Oral Daily   acetaminophen  **OR** acetaminophen , diphenhydrAMINE , HYDROmorphone  (DILAUDID ) injection, ondansetron  **OR** ondansetron  (ZOFRAN ) IV, oxyCODONE , polyethylene glycol  Assessment/ Plan:  [name] is a 58 y.o.  female  with past medical history including depression, type 2 diabetes, asthma, hypertension, hypothyroidism, CLL, and end-stage renal disease on hemodialysis.  She presents to the emergency department with fever and diarrhea. She has been admitted under observation for Thrombocytopenia (HCC) [D69.6] Dyslipidemia [E78.5] Acute cystitis with hematuria [N30.01] Multiple falls [R29.6]   [facility]/MWF/right AVF/91 kg   End stage renal disease on hemodialysis.  Receiving dialysis today, UF 1.5-2L as tolerated. Next treatment scheduled for Friday.  2. 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  Lab Results  Component Value Date   HGB 10.0 (L) 08/17/2023    Hemoglobin 10.0, within desired range.  She receives Mircera as outpatient.  3. Secondary Hyperparathyroidism: with outpatient labs: PTH 223, phosphorus 6.5, calcium  9.8 on 06/27/23.   Lab Results  Component Value Date   CALCIUM  8.7 (L) 08/17/2023   PHOS 6.3 (H) 08/15/2023  Currently prescribed calcitriol and calcium  acetate outpatient.   Phosphorus 6.3. Continue calcium  acetate 2 tablets p.o. 3 times daily with meals. Continue to monitor bone minerals.   4. 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 Most recent hemoglobin A1c is 5.6 on 07/29/23.   Glucose has been elevated, due to steroid use.     LOS: 5 Tonya Myers 4/23/202510:03 AM

## 2023-08-17 NOTE — Progress Notes (Signed)
 Occupational Therapy Treatment Patient Details Name: Tonya Myers MRN: [id number] DOB: 05/13/1965 Today's Date: 08/17/2023   History of present illness Tonya Myers is a 58 y.o. 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 (~2022) and protracted illness course with multiple admissions for encephalopathy and sepsis, now functionally much improved though with chronic neuropathy admitted to [facility] 4/4-4/9 for presyncope attributed to orthostatic hypotension discharged home with home health on midodrine , rehospitalized at [facility] 4/10-4/13 with weakness and thrombocytopenia, following up with hematology on 4/18, now sent from hematology office for inability to function at home in the setting of worsening thrombocytopenia for which she was started on a 7-day prednisone  burst.   OT comments  Pt seen for OT treatment on this date. Upon arrival to room pt lying in bed, agreeable to tx. Pt eager to get washed up on this date. Pt requires CGA during standing bathing/dressing tasks and supervision for sitting. Pt demonstrated improved reaching outside her BOS during bathing tasks. Pt tolerates multiple STS from EOB throughout bathing. Noted standing tolerance is ~82mins before needing to sit down with intermittent single UE support. Pt donned fresh gowns with supervision assistance while seated on EOB. Pt endorses fatigue at the end of bathing/grooming session, retired to her bed with all need in reach. Pt making good progress toward goals, will continue to follow POC. Discharge recommendation remains appropriate.        If plan is discharge home, recommend the following:  A little help with walking and/or transfers;A little help with bathing/dressing/bathroom;Direct supervision/assist for medications management   Equipment  Recommendations  BSC/3in1    Recommendations for Other Services      Precautions / Restrictions Precautions Precautions: Fall Recall of Precautions/Restrictions: Intact Restrictions Weight Bearing Restrictions Per Provider Order: Yes RLE Weight Bearing Per Provider Order: Weight bearing as tolerated LLE Weight Bearing Per Provider Order: Weight bearing as tolerated Other Position/Activity Restrictions: LLE KI PRN for comfort       Mobility Bed Mobility Overal bed mobility: Modified Independent Bed Mobility: Supine to Sit, Sit to Supine     Supine to sit: Modified independent (Device/Increase time) Sit to supine: Modified independent (Device/Increase time)        Transfers Overall transfer level: Needs assistance Equipment used: Rolling walker (2 wheels) Transfers: Sit to/from Stand Sit to Stand: Contact guard assist           General transfer comment: Good demonstration of STS technique from low bed height     Balance Overall balance assessment: Needs assistance Sitting-balance support: No upper extremity supported, Feet supported Sitting balance-Leahy Scale: Good     Standing balance support: No upper extremity supported, During functional activity Standing balance-Leahy Scale: Fair Standing balance comment: dynamic standing during grooming task <3 mins                           ADL either performed or assessed with clinical judgement   ADL Overall ADL's : Needs assistance/impaired     Grooming: Wash/dry face;Wash/dry hands;Applying deodorant;Standing;Contact guard assist Grooming Details (indicate cue type and reason): at sink level Upper Body Bathing: Supervision/ safety;Sitting   Lower Body Bathing: Sit to/from stand;Contact guard assist       Lower Body Dressing: Contact guard assist;Sit to/from stand   Toilet Transfer: Ambulation;Contact guard assist;Rolling walker (2 wheels) (  Simulated)           Functional mobility during  ADLs: Contact guard assist;Rolling walker (2 wheels) General ADL Comments: Completed Sink level UB/LB bathing with CGA during standing, supervision. UB/LB dressing at EOB with supervision    Communication Communication Communication: No apparent difficulties   Cognition Arousal: Alert Behavior During Therapy: WFL for tasks assessed/performed Cognition: No apparent impairments             OT - Cognition Comments: A/Ox4                 Following commands: Intact        Cueing   Cueing Techniques: Verbal cues, Visual cues  Exercises Exercises: Other exercises Other Exercises Other Exercises: Edu: Fall prevention, energy conservation tactics    Shoulder Instructions       General Comments      Pertinent Vitals/ Pain       Pain Assessment Pain Assessment: No/denies pain  Home Living                                          Prior Functioning/Environment              Frequency  Min 2X/week        Progress Toward Goals  OT Goals(current goals can now be found in the care plan section)  Progress towards OT goals: Progressing toward goals  Acute Rehab OT Goals Patient Stated Goal: improve function OT Goal Formulation: With patient Time For Goal Achievement: 08/27/23 Potential to Achieve Goals: Good ADL Goals Pt Will Perform Grooming: standing;Independently Pt Will Perform Lower Body Dressing: Independently;sit to/from stand Pt Will Transfer to Toilet: with modified independence;ambulating;regular height toilet Pt Will Perform Toileting - Clothing Manipulation and hygiene: with modified independence;sitting/lateral leans;sit to/from stand Additional ADL Goal #1: Pt will improve IADL performance as evidenced by participating in pill box assessment to improve medication management  Plan      Co-evaluation                 AM-PAC OT "6 Clicks" Daily Activity     Outcome Measure   Help from another person eating meals?:  None Help from another person taking care of personal grooming?: A Little Help from another person toileting, which includes using toliet, bedpan, or urinal?: A Little Help from another person bathing (including washing, rinsing, drying)?: A Little Help from another person to put on and taking off regular upper body clothing?: A Little Help from another person to put on and taking off regular lower body clothing?: None 6 Click Score: 20    End of Session Equipment Utilized During Treatment: Rolling walker (2 wheels)  OT Visit Diagnosis: Other abnormalities of gait and mobility (R26.89)   Activity Tolerance Patient tolerated treatment well   Patient Left in bed;with call bell/phone within reach;with bed alarm set   Nurse Communication Mobility status        Time: 1610-9604 OT Time Calculation (min): 28 min  Charges: OT General Charges $OT Visit: 1 Visit OT Treatments $Self Care/Home Management : 23-37 mins  Rosaria Common M.S. OTR/L  08/17/23, 4:07 PM

## 2023-08-17 NOTE — Plan of Care (Signed)

## 2023-08-17 NOTE — Progress Notes (Signed)
  Received patient in bed to unit.   Informed consent signed and in chart.    TX duration: 3.5HR     Transported back to floor  Hand-off given to patient's nurse. Pt still c/o 7/10 pain in R hand and bilateral feet    Access used: R AVG Access issues: none   Total UF removed: 2.0L Medication(s) given: dilaudid  Post HD VS: WNL Post HD weight: 88.1KGS     Bettye Bruins LPN Kidney Dialysis Unit

## 2023-08-17 NOTE — Inpatient Diabetes Management (Signed)
 Inpatient Diabetes Program Recommendations  AACE/ADA: New Consensus Statement on Inpatient Glycemic Control   Target Ranges:  Prepandial:   less than 140 mg/dL      Peak postprandial:   less than 180 mg/dL (1-2 hours)      Critically ill patients:  140 - 180 mg/dL    Latest Reference Range & Units 08/16/23 07:56 08/16/23 12:49 08/16/23 15:58 08/16/23 21:02 08/17/23 03:21  Glucose-Capillary 70 - 99 mg/dL 295 (H) 621 (H) 308 (H) 432 (H) 393 (H)    Latest Reference Range & Units 03/28/23 07:43 07/29/23 23:58  Hemoglobin A1C 4.8 - 5.6 % 5.6 5.6   Review of Glycemic Control  Diabetes history: DM2 Outpatient Diabetes medications: None; Dexcom G6  Current orders for Inpatient glycemic control: Novolog  0-20 units Q4H, Semglee  5 units BID; Prednisone  80 mg QAM   Inpatient Diabetes Program Recommendations:     Insulin : If steroids are continued as ordered, please consider increasing Semglee  to 10 units BID and ordering Novolog  4 units TID with meals for meal coverage if patient eats at least 50% of meals.  Thanks, Beacher Limerick, RN, MSN, CDCES Diabetes Coordinator Inpatient Diabetes Program [phone] (Team Pager from 8am to 5pm)

## 2023-08-18 DIAGNOSIS — D696 Thrombocytopenia, unspecified: Secondary | ICD-10-CM | POA: Diagnosis not present

## 2023-08-18 LAB — BASIC METABOLIC PANEL WITH GFR
Anion gap: 11 (ref 5–15)
BUN: 70 mg/dL — ABNORMAL HIGH (ref 6–20)
CO2: 27 mmol/L (ref 22–32)
Calcium: 9.2 mg/dL (ref 8.9–10.3)
Chloride: 97 mmol/L — ABNORMAL LOW (ref 98–111)
Creatinine, Ser: 5.6 mg/dL — ABNORMAL HIGH (ref 0.44–1.00)
GFR, Estimated: 8 mL/min — ABNORMAL LOW (ref >60)
Glucose, Bld: 108 mg/dL — ABNORMAL HIGH (ref 70–99)
Potassium: 5 mmol/L (ref 3.5–5.1)
Sodium: 135 mmol/L (ref 135–145)

## 2023-08-18 LAB — CBC
HCT: 36.1 % (ref 36.0–46.0)
Hemoglobin: 11.6 g/dL — ABNORMAL LOW (ref 12.0–15.0)
MCH: 30.4 pg (ref 26.0–34.0)
MCHC: 32.1 g/dL (ref 30.0–36.0)
MCV: 94.5 fL (ref 80.0–100.0)
Platelets: 46 10*3/uL — ABNORMAL LOW (ref 150–400)
RBC: 3.82 MIL/uL — ABNORMAL LOW (ref 3.87–5.11)
RDW: 20.1 % — ABNORMAL HIGH (ref 11.5–15.5)
WBC: 6.2 10*3/uL (ref 4.0–10.5)
nRBC: 0 % (ref 0.0–0.2)

## 2023-08-18 LAB — GLUCOSE, CAPILLARY
Glucose-Capillary: 114 mg/dL — ABNORMAL HIGH (ref 70–99)
Glucose-Capillary: 222 mg/dL — ABNORMAL HIGH (ref 70–99)
Glucose-Capillary: 285 mg/dL — ABNORMAL HIGH (ref 70–99)
Glucose-Capillary: 305 mg/dL — ABNORMAL HIGH (ref 70–99)
Glucose-Capillary: 374 mg/dL — ABNORMAL HIGH (ref 70–99)
Glucose-Capillary: 88 mg/dL (ref 70–99)

## 2023-08-18 LAB — PHOSPHORUS: Phosphorus: 4.1 mg/dL (ref 2.5–4.6)

## 2023-08-18 LAB — MAGNESIUM: Magnesium: 2.4 mg/dL (ref 1.7–2.4)

## 2023-08-18 MED ORDER — BISACODYL 10 MG RE SUPP: 10.0000 mg | Freq: Every day | RECTAL | Status: DC | PRN

## 2023-08-18 MED ORDER — BISACODYL 5 MG PO TBEC
10.0000 mg | DELAYED_RELEASE_TABLET | Freq: Every day | ORAL | Status: DC
  Administered 2023-08-18: 10 mg via ORAL
  Filled 2023-08-18: qty 2

## 2023-08-18 MED ORDER — POLYETHYLENE GLYCOL 3350 17 G PO PACK
17.0000 g | PACK | Freq: Two times a day (BID) | ORAL | Status: DC
  Administered 2023-08-18: 17 g via ORAL

## 2023-08-18 MED ORDER — INSULIN ASPART 100 UNIT/ML IJ SOLN
0.0000 [IU] | Freq: Three times a day (TID) | INTRAMUSCULAR | Status: DC
  Administered 2023-08-18: 20 [IU] via SUBCUTANEOUS
  Administered 2023-08-18: 11 [IU] via SUBCUTANEOUS
  Filled 2023-08-18 (×2): qty 1

## 2023-08-18 NOTE — Plan of Care (Signed)
  Problem: Clinical Measurements: Goal: Ability to maintain clinical measurements within normal limits will improve Outcome: Progressing   Problem: Pain Managment: Goal: General experience of comfort will improve and/or be controlled Outcome: Progressing   Problem: Activity: Goal: Risk for activity intolerance will decrease Outcome: Progressing   Problem: Nutrition: Goal: Adequate nutrition will be maintained Outcome: Progressing   Problem: Coping: Goal: Level of anxiety will decrease Outcome: Progressing   Problem: Elimination: Goal: Will not experience complications related to bowel motility Outcome: Progressing

## 2023-08-18 NOTE — Progress Notes (Signed)
 PROGRESS NOTE   Tonya Myers  ZOX:[id number] DOB: 1966/01/17 DOA: 08/12/2023 PCP: Lyle San, MD    HPI was taken from Dr. Vallarie Gauze: Tonya Myers is a 58 y.o. 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 (~2022) and protracted illness course with multiple admissions for encephalopathy and sepsis, now functionally much improved though with chronic neuropathy admitted to [facility] 4/4-4/9 for presyncope attributed to orthostatic hypotension discharged home with home health on midodrine , rehospitalized at [facility] 4/10-4/13 with weakness and thrombocytopenia, following up with hematology on 4/18, now sent from hematology office for inability to function at home in the setting of worsening thrombocytopenia for which she was started on a 7-day prednisone  burst. Patient has multiple vague complaints mostly notable for bilateral knee pain.  Oncology note reviewed.  Patient had a bone marrow biopsy on 4/9 that was unrevealing. ED course and data review: Vitals within normal limits Labs notable for pancytopenia with stable thrombocytopenia of 26,000.  Urinalysis with moderate leukocyte esterase EKG sinus at 82 with no ischemic changes X-rays bilateral knees showed a left patella fracture   Patient placed in a knee immobilizer Started on ceftriaxone  for possible UTI Treated with pain medication   Hospitalist consulted for admission.     Assessment & Plan:   Principal Problem:   Thrombocytopenia (HCC) Active Problems:   Failure to thrive in adult/frailty   Type 2 diabetes mellitus with peripheral neuropathy (HCC)   End-stage renal disease on hemodialysis (HCC)   Left patella fracture   Type II diabetes mellitus with renal manifestations (HCC)   Anemia in ESRD (end-stage renal disease) (HCC)  Assessment and Plan:  #  Thrombocytopenia: etiology unclear. Trending up again today. Continue on steroids x 7 days as per heme. Recent bone marrow biopsy was unremarkable. Will continue to monitor  4/24 Plts 46k  Generalized weakness: PT/OT recs SNF. Pt is agreeable to SNF. CM is working on SNF placement. Multiple recent hospitalizations  ESRD:  on HD MWF. Nephro following and recs apprec   ACD: likely secondary to ESRD. Will transfuse if Hb < 7.0   Hypokalemia: WNL today   Left patella fracture: continue w/ knee immobilizer prn for comfort. Weightbearing as tolerated. F/u outpatient w/ ortho surg  Orthostatic hypotension: continue on midodrine    Hx of CLL: dx 2016. Recent bone marrow biopsy on 08/03/23 unremarkable  Possible UTI: urine cx shows no growth. Completed 3 day course of abxs   HSV: w/ acute flare. Continue on valtrex    Possible vaginal yeast infection: s/p fluconazole  x 2   Hypothyroidism: continue on levothyroxine    DM2: HbA1c 5.4, well controlled. Continue on SSI w/ accuchecks while on steroids   Hx of seizures: pt stopped taking vimpat  in 2024 herself  Constipation, started laxatives.  DVT prophylaxis: SCDs secondary to significant thrombocytopenia  Code Status: full Family Communication:  Disposition Plan: Plan was to DC to SNF but patient's condition improved, PT reevaluation done and patient would like to go home with home health services, which will be arranged by TOC.   Level of care: Progressive  Status is: Inpatient Remains inpatient appropriate because: medically stable.  Patient was waiting for SNF placement, physical condition improved, now plan is to discharge her home with home health services.   Discharge tomorrow a.m. after hemodialysis.   Consultants:  Nephro   Procedures:   Antimicrobials:  Subjective: No significant events overnight, patient was requesting laxatives, had a BM hard stool today. Patient was resting comfortably, did work with physical therapy  and feels improvement in the physical activity, would like to go home with home health services. TOC will arrange home health services, patient would like to go home tomorrow after hemodialysis.   Objective: Vitals:   08/18/23 0420 08/18/23 0810 08/18/23 1242 08/18/23 1724  BP: 106/65 104/73 111/69 121/82  Pulse: (!) 52 96 (!) 56 (!) 51  Resp: 18     Temp: (!) 97.5 F (36.4 C) 97.8 F (36.6 C) 97.9 F (36.6 C) 97.8 F (36.6 C)  TempSrc: Oral     SpO2: 100% 100% 100% 100%  Weight:      Height:       No intake or output data in the 24 hours ending 08/18/23 1740  Filed Weights   08/15/23 1430 08/17/23 0833 08/17/23 1326  Weight: 88.9 kg 90 kg 88.1 kg    Examination:  General exam: Appears comfortable  Respiratory system: decreased breath sounds b/l otherwise clear  Cardiovascular system: S1/S2+. No rubs or gallops  Gastrointestinal system: abd is soft, NT, ND & normal bowel sounds  Central nervous system: alert & oriented. Moves all extremities  Psychiatry: Judgement and insight appears at baseline. Flat mood and affect    Data Reviewed: I have personally reviewed following labs and imaging studies  CBC: Recent Labs  Lab 08/14/23 0920 08/15/23 0526 08/16/23 0533 08/17/23 0535 08/18/23 0314  WBC 4.8 7.4 7.7 6.0 6.2  HGB 10.0* 9.5* 9.7* 10.0* 11.6*  HCT 31.9* 29.5* 30.1* 30.1* 36.1  MCV 97.0 96.4 93.8 93.5 94.5  PLT 35* 41* 47* 48* 46*   Basic Metabolic Panel: Recent Labs  Lab 08/14/23 0920 08/15/23 0526 08/16/23 0533 08/17/23 0535 08/18/23 0314  NA 135 136 132* 132* 135  K 4.2 4.7 4.5 4.5 5.0  CL 95* 98 93* 96* 97*  CO2 26 25 26  21* 27  GLUCOSE 202* 246* 259* 214* 108*  BUN 68* 97* 73* 107* 70*  CREATININE 8.99* 9.97* 6.33* 8.01* 5.60*  CALCIUM  9.0 8.7* 8.8* 8.7* 9.2  MG  --   --   --  2.3 2.4  PHOS  --  6.3*  --   --  4.1   GFR: Estimated Creatinine Clearance: 13.5 mL/min (A) (by C-G formula based on SCr of 5.6 mg/dL (H)). Liver Function  Tests: No results for input(s): "AST", "ALT", "ALKPHOS", "BILITOT", "PROT", "ALBUMIN" in the last 168 hours. No results for input(s): "LIPASE", "AMYLASE" in the last 168 hours. No results for input(s): "AMMONIA" in the last 168 hours. Coagulation Profile: No results for input(s): "INR", "PROTIME" in the last 168 hours. Cardiac Enzymes: No results for input(s): "CKTOTAL", "CKMB", "CKMBINDEX", "TROPONINI" in the last 168 hours. BNP (last 3 results) No results for input(s): "PROBNP" in the last 8760 hours. HbA1C: No results for input(s): "HGBA1C" in the last 72 hours. CBG: Recent Labs  Lab 08/17/23 2344 08/18/23 0420 08/18/23 0811 08/18/23 1241 08/18/23 1725  GLUCAP 205* 114* 88 222* 305*   Lipid Profile: No results for input(s): "CHOL", "HDL", "LDLCALC", "TRIG", "CHOLHDL", "LDLDIRECT" in the last 72 hours. Thyroid  Function Tests: No results for input(s): "TSH", "T4TOTAL", "FREET4", "T3FREE", "THYROIDAB" in the last 72 hours. Anemia Panel: No results for input(s): "VITAMINB12", "FOLATE", "FERRITIN", "TIBC", "IRON", "RETICCTPCT" in the last 72 hours. Sepsis Labs: No results for input(s): "PROCALCITON", "LATICACIDVEN" in the last 168 hours.  Recent Results (from the past 240  hours)  Urine Culture     Status: None   Collection Time: 08/12/23  6:33 PM   Specimen: Urine, Catheterized  Result Value Ref Range Status   Specimen Description   Final    URINE, CATHETERIZED Performed at [facility], [address] [place], [place] [address]    Special Requests   Final    NONE Performed at [facility], [address], [place], [place] [address]    Culture   Final    NO GROWTH Performed at [facility], [address] [place], [place] [address]    Report Status 08/14/2023 FINAL  Final         Radiology Studies: No results found.    Scheduled Meds:  bisacodyl   10 mg Oral QHS   calcium  acetate  1,334 mg Oral TID WC   docusate sodium   200 mg  Oral BID   insulin  aspart  0-20 Units Subcutaneous TID AC & HS   insulin  aspart  4 Units Subcutaneous TID WC   insulin  glargine-yfgn  10 Units Subcutaneous BID   levothyroxine   125 mcg Oral Q0600   melatonin  5 mg Oral QHS   pantoprazole   40 mg Oral Daily   polyethylene glycol  17 g Oral BID   predniSONE   80 mg Oral Q breakfast   valACYclovir   500 mg Oral Daily   Continuous Infusions:   LOS: 6 days      Althia Atlas, MD [facility] Pager [phone]  If 7PM-7AM, please contact night-coverage [email or web address] 08/18/2023, 5:40 PM

## 2023-08-18 NOTE — Progress Notes (Signed)
 Physical Therapy Treatment Patient Details Name: Tonya Myers MRN: [id number] DOB: 03/03/1966 Today's Date: 08/18/2023   History of Present Illness Tonya Myers is a 58 y.o. 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 (~2022) and protracted illness course with multiple admissions for encephalopathy and sepsis, now functionally much improved though with chronic neuropathy admitted to [facility] 4/4-4/9 for presyncope attributed to orthostatic hypotension discharged home with home health on midodrine , rehospitalized at [facility] 4/10-4/13 with weakness and thrombocytopenia, following up with hematology on 4/18, now sent from hematology office for inability to function at home in the setting of worsening thrombocytopenia for which she was started on a 7-day prednisone  burst.    PT Comments  Pt voices desire to go home but with some continued anxiety about safety.  However with extensive gait training/stair training t/o the session she was able to successfully negotiate up/down more than a flight of steps and was able to successfully do so w/o physical assist. She was also able to advance to step through consistent cadence with walker.  Pt showing good improvement, will benefit from continued PT to address functional and safety limitations.     If plan is discharge home, recommend the following: Assist for transportation;Assistance with cooking/housework;Help with stairs or ramp for entrance;A little help with walking and/or transfers;A little help with bathing/dressing/bathroom   Can travel by private vehicle     Yes  Equipment Recommendations  Rolling walker (2 wheels);BSC/3in1 (has been delivered?)    Recommendations for Other Services       Precautions / Restrictions Precautions Precautions: Fall Restrictions Weight  Bearing Restrictions Per Provider Order: No RLE Weight Bearing Per Provider Order: Weight bearing as tolerated LLE Weight Bearing Per Provider Order: Weight bearing as tolerated     Mobility  Bed Mobility Overal bed mobility: Modified Independent Bed Mobility: Supine to Sit, Sit to Supine     Supine to sit: Modified independent (Device/Increase time) Sit to supine: Modified independent (Device/Increase time)        Transfers Overall transfer level: Needs assistance Equipment used: Rolling walker (2 wheels) Transfers: Sit to/from Stand Sit to Stand: Contact guard assist           General transfer comment: able to rise to standing multiple times t/o session from various surfaces    Ambulation/Gait Ambulation/Gait assistance: Supervision Gait Distance (Feet): 200 Feet Assistive device: Rolling walker (2 wheels)         General Gait Details: Pt initially with step-to cadence but with cuing was able to become more confident and attained continuous forward momentum with expected/appropriate walker use (adjusted up to more appropriate comfortable height).   Stairs Stairs: Yes Stairs assistance: Contact guard assist Stair Management: One rail Left, Step to pattern, Sideways Number of Stairs: 20 General stair comments: up/down steps multiple times with various strategies.  Successful with side stepping, b/l UEs on L rail with slow and deliberate foot placement.  Pt with greatly increased confidence after training   Wheelchair Mobility     Tilt Bed    Modified Rankin (Stroke Patients Only)       Balance Overall balance assessment: Needs assistance Sitting-balance support: No upper extremity supported, Feet supported Sitting balance-Leahy Scale: Good     Standing balance support: No upper extremity supported, During functional activity Standing balance-Leahy Scale: Fair Standing balance comment: maintaining knees in  extension to maintain static balance w/o AD -  tending to lean on wall/rail                            Communication Communication Communication: No apparent difficulties  Cognition Arousal: Alert Behavior During Therapy: WFL for tasks assessed/performed                             Following commands: Intact      Cueing Cueing Techniques: Verbal cues, Visual cues  Exercises      General Comments        Pertinent Vitals/Pain Pain Assessment Pain Assessment: 0-10 Pain Score: 7  Pain Location: L knee, b/l feet    Home Living                          Prior Function            PT Goals (current goals can now be found in the care plan section) Progress towards PT goals: Progressing toward goals    Frequency    Min 2X/week      PT Plan      Co-evaluation              AM-PAC PT "6 Clicks" Mobility   Outcome Measure  Help needed turning from your back to your side while in a flat bed without using bedrails?: None Help needed moving from lying on your back to sitting on the side of a flat bed without using bedrails?: None Help needed moving to and from a bed to a chair (including a wheelchair)?: A Little Help needed standing up from a chair using your arms (e.g., wheelchair or bedside chair)?: A Little Help needed to walk in hospital room?: A Little Help needed climbing 3-5 steps with a railing? : A Lot 6 Click Score: 19    End of Session Equipment Utilized During Treatment: Gait belt Activity Tolerance: Patient tolerated treatment well Patient left: in bed;with call bell/phone within reach Nurse Communication: Mobility status PT Visit Diagnosis: Muscle weakness (generalized) (M62.81);History of falling (Z91.81);Unsteadiness on feet (R26.81);Difficulty in walking, not elsewhere classified (R26.2)     Time: 4098-1191 PT Time Calculation (min) (ACUTE ONLY): 30 min  Charges:    $Gait Training: 23-37 mins PT General Charges $$ ACUTE PT VISIT: 1 Visit                      Darice Edelman, DPT 08/18/2023, 1:47 PM

## 2023-08-18 NOTE — Progress Notes (Signed)
 [facility]  ROUNDING NOTE   Subjective:   Tonya Myers is a 58 year old female with past medical history including depression, type 2 diabetes, asthma, hypertension, hypothyroidism, CLL, and end-stage renal disease on hemodialysis.  She presents to the emergency department with multiple falls. She has been admitted for Thrombocytopenia (HCC) [D69.6] Dyslipidemia [E78.5] Acute cystitis with hematuria [N30.01] Multiple falls [R29.6]  Patient is known our practice and receives outpatient dialysis treatments at [facility] on a MWF schedule, supervised by [facility] physicians.   Update:  Patient sitting up in bed Feels she's becoming more depressed the longer she stays Doesn't want to become weaker by staying in bed so much.  Frustrated with rehab delay   Objective:  Vital signs in last 24 hours:  Temp:  [97.5 F (36.4 C)-98.4 F (36.9 C)] 97.9 F (36.6 C) (04/24 1242) Pulse Rate:  [52-96] 56 (04/24 1242) Resp:  [18-20] 18 (04/24 0420) BP: (101-123)/(65-84) 111/69 (04/24 1242) SpO2:  [98 %-100 %] 100 % (04/24 1242) Weight:  [88.1 kg] 88.1 kg (04/23 1326)  Weight change:  Filed Weights   08/15/23 1430 08/17/23 0833 08/17/23 1326  Weight: 88.9 kg 90 kg 88.1 kg    Intake/Output: I/O last 3 completed shifts: In: -  Out: 2000 [Other:2000]   Intake/Output this shift:  No intake/output data recorded.  Physical Exam: General: NAD  Head: Normocephalic, atraumatic. Moist oral mucosal membranes  Eyes: Anicteric  Lungs:  Clear to auscultation, normal effort  Heart: Regular rate and rhythm  Abdomen:  Soft, nontender  Extremities: No peripheral edema.  Neurologic: Alert and oriented moving all four extremities  Skin: No lesions  Access: Lt AVG    Basic Metabolic Panel: Recent Labs  Lab 08/14/23 0920 08/15/23 0526 08/16/23 0533 08/17/23 0535 08/18/23 0314  NA 135 136 132* 132* 135  K 4.2 4.7 4.5 4.5 5.0  CL 95* 98 93* 96* 97*  CO2 26 25  26  21* 27  GLUCOSE 202* 246* 259* 214* 108*  BUN 68* 97* 73* 107* 70*  CREATININE 8.99* 9.97* 6.33* 8.01* 5.60*  CALCIUM  9.0 8.7* 8.8* 8.7* 9.2  MG  --   --   --  2.3 2.4  PHOS  --  6.3*  --   --  4.1    Liver Function Tests: No results for input(s): "AST", "ALT", "ALKPHOS", "BILITOT", "PROT", "ALBUMIN" in the last 168 hours.  No results for input(s): "LIPASE", "AMYLASE" in the last 168 hours.  No results for input(s): "AMMONIA" in the last 168 hours.   CBC: Recent Labs  Lab 08/14/23 0920 08/15/23 0526 08/16/23 0533 08/17/23 0535 08/18/23 0314  WBC 4.8 7.4 7.7 6.0 6.2  HGB 10.0* 9.5* 9.7* 10.0* 11.6*  HCT 31.9* 29.5* 30.1* 30.1* 36.1  MCV 97.0 96.4 93.8 93.5 94.5  PLT 35* 41* 47* 48* 46*    Cardiac Enzymes: No results for input(s): "CKTOTAL", "CKMB", "CKMBINDEX", "TROPONINI" in the last 168 hours.  BNP: Invalid input(s): "POCBNP"  CBG: Recent Labs  Lab 08/17/23 1955 08/17/23 2344 08/18/23 0420 08/18/23 0811 08/18/23 1241  GLUCAP 356* 205* 114* 88 222*     Microbiology: Results for orders placed or performed during the hospital encounter of 08/12/23  Urine Culture     Status: None   Collection Time: 08/12/23  6:33 PM   Specimen: Urine, Catheterized  Result Value Ref Range Status   Specimen Description   Final    URINE, CATHETERIZED Performed at [facility], [address] [place], [place] [address]  Special Requests   Final    NONE Performed at [facility], [address], [place], [place] [address]    Culture   Final    NO GROWTH Performed at [facility], [address], [place], [place] [address]    Report Status 08/14/2023 FINAL  Final    Coagulation Studies: No results for input(s): "LABPROT", "INR" in the last 72 hours.  Urinalysis: No results for input(s): "COLORURINE", "LABSPEC", "PHURINE", "GLUCOSEU", "HGBUR", "BILIRUBINUR", "KETONESUR", "PROTEINUR", "UROBILINOGEN", "NITRITE", "LEUKOCYTESUR" in the last  72 hours.  Invalid input(s): "APPERANCEUR"     Imaging: No results found.     Medications:       calcium  acetate  1,334 mg Oral TID WC   docusate sodium   200 mg Oral BID   insulin  aspart  0-20 Units Subcutaneous TID AC & HS   insulin  aspart  4 Units Subcutaneous TID WC   insulin  glargine-yfgn  10 Units Subcutaneous BID   levothyroxine   125 mcg Oral Q0600   melatonin  5 mg Oral QHS   pantoprazole   40 mg Oral Daily   predniSONE   80 mg Oral Q breakfast   valACYclovir   500 mg Oral Daily   acetaminophen  **OR** acetaminophen , alum & mag hydroxide-simeth, diphenhydrAMINE , HYDROmorphone  (DILAUDID ) injection, ondansetron  **OR** ondansetron  (ZOFRAN ) IV, oxyCODONE , polyethylene glycol  Assessment/ Plan:  Tonya Myers is a 58 y.o.  female  with past medical history including depression, type 2 diabetes, asthma, hypertension, hypothyroidism, CLL, and end-stage renal disease on hemodialysis.  She presents to the emergency department with fever and diarrhea. She has been admitted under observation for Thrombocytopenia (HCC) [D69.6] Dyslipidemia [E78.5] Acute cystitis with hematuria [N30.01] Multiple falls [R29.6]   [facility] [place]/MWF/right AVF/91 kg   End stage renal disease on hemodialysis.  2L fluid removal with dialysis yesterday. Next treatment scheduled for Friday.  2. 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  Lab Results  Component Value Date   HGB 11.6 (L) 08/18/2023    Hemoglobin 11.6. Mircera as outpatient.  3. Secondary Hyperparathyroidism: with outpatient labs: PTH 223, phosphorus 6.5, calcium  9.8 on 06/27/23.   Lab Results  Component Value Date   CALCIUM  9.2 08/18/2023   PHOS 4.1 08/18/2023  Currently prescribed calcitriol and calcium  acetate outpatient. Continue calcium  acetate 2 tablets p.o. 3 times daily with meals. Bone minerals acceptable.   4. Diabetes mellitus type II with chronic kidney disease/renal manifestations:  noninsulin dependent. Most recent hemoglobin A1c is 5.6 on 07/29/23.   Glucose has been elevated, due to steroid use. Primary team to manage SSI    LOS: 6 [employer] 4/24/20251:06 PM

## 2023-08-18 NOTE — TOC Progression Note (Addendum)
 Transition of Care [facility]) - Progression Note    Patient Details  Name: Tonya Myers MRN: [id number] Date of Birth: [birth year]  Transition of Care [facility]) [name] Contact  Baird Bombard, RN Phone Number: 08/18/2023, 1:44 PM  Clinical Narrative:    Spoke with patient regarding discharge. She worked with PT today and was downgraded to [facility] PT/ OT/ HHA. She is agreeable to going home especially after learing of her only bed offer for [facility]. She was provided choices for [facility] agencies including [facility], [facility], [place], [place], and [facility]. She does not have a preference of an agency other than making sure one has a HHA.  She has been advised the accepting agency will contact her within 48 hour of being discharge. Patient's grandson will transport her home today.   Referral for [facility] sent and accepted by Verdis Glade from [facility]       Expected Discharge Plan and Services                                               Social Determinants of Health (SDOH) Interventions SDOH Screenings   Food Insecurity: No Food Insecurity (08/12/2023)  Recent Concern: Food Insecurity - Food Insecurity Present (07/06/2023)   Received from [facility]  Housing: Low Risk  (08/12/2023)  Recent Concern: Housing - High Risk (07/06/2023)   Received from [facility]  Transportation Needs: No Transportation Needs (08/12/2023)  Recent Concern: Transportation Needs - Unmet Transportation Needs (07/06/2023)   Received from [facility]  Utilities: Not At Risk (08/12/2023)  Depression (PHQ2-9): Low Risk  (10/29/2020)  Financial Resource Strain: High Risk (07/06/2023)   Received from [facility]  Social Connections: Unknown (08/12/2023)  Tobacco Use: Low Risk  (08/12/2023)    Readmission Risk Interventions    12/24/2021   12:03 PM 11/24/2020    2:17 PM  Readmission Risk Prevention Plan  Transportation Screening Complete Complete  PCP or  Specialist Appt within 3-5 Days  Complete  HRI or Home Care Consult  Complete  Social Work Consult for Recovery Care Planning/Counseling  Complete  Palliative Care Screening  Not Applicable  Medication Review [employer]) Complete Complete  PCP or Specialist appointment within 3-5 days of discharge Complete   HRI or Home Care Consult Complete   SW Recovery Care/Counseling Consult Complete   Palliative Care Screening Not Applicable   Skilled Nursing Facility Complete

## 2023-08-18 NOTE — Inpatient Diabetes Management (Signed)
 Inpatient Diabetes Program Recommendations  AACE/ADA: New Consensus Statement on Inpatient Glycemic Control (2015)  Target Ranges:  Prepandial:   less than 140 mg/dL      [facility] postprandial:   less than 180 mg/dL (1-2 hours)      Critically ill patients:  140 - 180 mg/dL   Lab Results  Component Value Date   GLUCAP 88 08/18/2023   HGBA1C 5.6 07/29/2023    Review of Glycemic Control  Latest Reference Range & Units 08/17/23 16:21 08/17/23 18:25 08/17/23 19:55 08/17/23 23:44 08/18/23 04:20 08/18/23 08:11  Glucose-Capillary 70 - 99 mg/dL 161 (H) 096 (H) 045 (H) 205 (H) 114 (H) 88  Diabetes history: DM2 Outpatient Diabetes medications: None; Dexcom G6  Current orders for Inpatient glycemic control: Novolog  0-20 units Q4H, Semglee  10 units BID; Novolog  4 units tid with meals; Prednisone  80 mg QAM Inpatient Diabetes Program Recommendations:    Consider reduction of Novolog  correction to tid with meals and HS (instead of q 4 hours). Insulin  needs will likely reduce significantly as steroids reduced.   Thanks,  Josefa Ni, RN, BC-ADM Inpatient Diabetes Coordinator Pager [phone]  (8a-5p)

## 2023-08-19 ENCOUNTER — Other Ambulatory Visit: Payer: Self-pay | Admitting: *Deleted

## 2023-08-19 DIAGNOSIS — D696 Thrombocytopenia, unspecified: Secondary | ICD-10-CM | POA: Diagnosis not present

## 2023-08-19 DIAGNOSIS — C9110 Chronic lymphocytic leukemia of B-cell type not having achieved remission: Secondary | ICD-10-CM

## 2023-08-19 LAB — RENAL FUNCTION PANEL
Albumin: 3.2 g/dL — ABNORMAL LOW (ref 3.5–5.0)
Anion gap: 12 (ref 5–15)
BUN: 101 mg/dL — ABNORMAL HIGH (ref 6–20)
CO2: 22 mmol/L (ref 22–32)
Calcium: 8.3 mg/dL — ABNORMAL LOW (ref 8.9–10.3)
Chloride: 95 mmol/L — ABNORMAL LOW (ref 98–111)
Creatinine, Ser: 7.26 mg/dL — ABNORMAL HIGH (ref 0.44–1.00)
GFR, Estimated: 6 mL/min — ABNORMAL LOW (ref >60)
Glucose, Bld: 119 mg/dL — ABNORMAL HIGH (ref 70–99)
Phosphorus: 5.2 mg/dL — ABNORMAL HIGH (ref 2.5–4.6)
Potassium: 5 mmol/L (ref 3.5–5.1)
Sodium: 129 mmol/L — ABNORMAL LOW (ref 135–145)

## 2023-08-19 LAB — CBC
HCT: 30.7 % — ABNORMAL LOW (ref 36.0–46.0)
Hemoglobin: 10.2 g/dL — ABNORMAL LOW (ref 12.0–15.0)
MCH: 30.1 pg (ref 26.0–34.0)
MCHC: 33.2 g/dL (ref 30.0–36.0)
MCV: 90.6 fL (ref 80.0–100.0)
Platelets: 46 10*3/uL — ABNORMAL LOW (ref 150–400)
RBC: 3.39 MIL/uL — ABNORMAL LOW (ref 3.87–5.11)
RDW: 20.2 % — ABNORMAL HIGH (ref 11.5–15.5)
WBC: 6.4 10*3/uL (ref 4.0–10.5)
nRBC: 0 % (ref 0.0–0.2)

## 2023-08-19 LAB — GLUCOSE, CAPILLARY
Glucose-Capillary: 158 mg/dL — ABNORMAL HIGH (ref 70–99)
Glucose-Capillary: 115 mg/dL — ABNORMAL HIGH (ref 70–99)

## 2023-08-19 MED ORDER — BLOOD GLUCOSE MONITORING SUPPL DEVI: 1.0000 | Freq: Three times a day (TID) | 0 refills | Status: DC

## 2023-08-19 MED ORDER — ONDANSETRON HCL 4 MG/2ML IJ SOLN
INTRAMUSCULAR | Status: AC
  Filled 2023-08-19: qty 2

## 2023-08-19 MED ORDER — PREDNISONE 50 MG PO TABS
60.0000 mg | ORAL_TABLET | Freq: Every day | ORAL | Status: DC
Start: 2023-08-23 — End: 2023-08-19

## 2023-08-19 MED ORDER — PREDNISONE 10 MG PO TABS
10.0000 mg | ORAL_TABLET | Freq: Every day | ORAL | Status: DC
Start: 2023-09-07 — End: 2023-08-19

## 2023-08-19 MED ORDER — PREDNISONE 50 MG PO TABS
50.0000 mg | ORAL_TABLET | Freq: Every day | ORAL | Status: DC
Start: 2023-08-26 — End: 2023-08-19

## 2023-08-19 MED ORDER — PANTOPRAZOLE SODIUM 40 MG PO TBEC: DELAYED_RELEASE_TABLET | ORAL | 1 refills | Status: DC

## 2023-08-19 MED ORDER — PREDNISONE 10 MG PO TABS
20.0000 mg | ORAL_TABLET | Freq: Every day | ORAL | Status: DC
Start: 2023-09-04 — End: 2023-08-19

## 2023-08-19 MED ORDER — HYDROXYZINE HCL 25 MG PO TABS: 25.0000 mg | ORAL_TABLET | Freq: Three times a day (TID) | ORAL | Status: DC | PRN

## 2023-08-19 MED ORDER — HYDROXYZINE HCL 25 MG PO TABS: 25.0000 mg | ORAL_TABLET | Freq: Three times a day (TID) | ORAL | 0 refills | Status: AC | PRN

## 2023-08-19 MED ORDER — PREDNISONE 10 MG PO TABS: 70.0000 mg | ORAL_TABLET | Freq: Every day | ORAL | Status: DC

## 2023-08-19 MED ORDER — LANCET DEVICE MISC: 1.0000 | Freq: Three times a day (TID) | 0 refills | Status: AC

## 2023-08-19 MED ORDER — BLOOD GLUCOSE TEST VI STRP: 1.0000 | ORAL_STRIP | Freq: Three times a day (TID) | 0 refills | Status: AC

## 2023-08-19 MED ORDER — HYDROMORPHONE HCL 2 MG PO TABS: 1.0000 mg | ORAL_TABLET | ORAL | Status: DC | PRN

## 2023-08-19 MED ORDER — OXYCODONE HCL 5 MG PO TABS
ORAL_TABLET | ORAL | Status: AC
  Filled 2023-08-19: qty 1

## 2023-08-19 MED ORDER — PEN NEEDLES 31G X 5 MM MISC
1.0000 | Freq: Three times a day (TID) | 0 refills | Status: DC
Start: 2023-08-19 — End: 2023-12-06

## 2023-08-19 MED ORDER — CAMPHOR-MENTHOL 0.5-0.5 % EX LOTN: TOPICAL_LOTION | CUTANEOUS | Status: DC | PRN

## 2023-08-19 MED ORDER — PREDNISONE 10 MG PO TABS
30.0000 mg | ORAL_TABLET | Freq: Every day | ORAL | Status: DC
Start: 2023-09-01 — End: 2023-08-19

## 2023-08-19 MED ORDER — VALACYCLOVIR HCL 500 MG PO TABS: 500.0000 mg | ORAL_TABLET | Freq: Every day | ORAL | 0 refills | Status: AC

## 2023-08-19 MED ORDER — PREDNISONE 10 MG PO TABS: ORAL_TABLET | ORAL | 0 refills | Status: AC

## 2023-08-19 MED ORDER — INSULIN ASPART 100 UNIT/ML FLEXPEN
0.0000 [IU] | PEN_INJECTOR | Freq: Three times a day (TID) | SUBCUTANEOUS | 0 refills | Status: DC
Start: 2023-08-19 — End: 2023-12-06

## 2023-08-19 MED ORDER — PANTOPRAZOLE SODIUM 40 MG PO TBEC: 40.0000 mg | DELAYED_RELEASE_TABLET | Freq: Two times a day (BID) | ORAL | Status: DC

## 2023-08-19 MED ORDER — LANCETS MISC
1.0000 | Freq: Three times a day (TID) | 0 refills | Status: AC
Start: 2023-08-19 — End: ?

## 2023-08-19 MED ORDER — INSULIN GLARGINE-YFGN 100 UNIT/ML ~~LOC~~ SOPN: 10.0000 [IU] | PEN_INJECTOR | Freq: Two times a day (BID) | SUBCUTANEOUS | 2 refills | Status: DC

## 2023-08-19 MED ORDER — PREDNISONE 10 MG PO TABS: 40.0000 mg | ORAL_TABLET | Freq: Every day | ORAL | Status: DC

## 2023-08-19 NOTE — Progress Notes (Signed)
 Hemodialysis Note:  Received patient in bed to unit. Alert and oriented. Informed consent singed and in chart.  Treatment initiated: 0828 Treatment completed: 1223  Access used: Right AVF Access issues: None  Patient tolerated well. Transported back to room, alert without acute distress. Report given to patient's RN.  Total UF removed: 1.6 Liters Medications given: Oxycodone  5 mg tablet, Zofran  4 mg IV  Post HD weight: 85.1 Kg  [facility]

## 2023-08-19 NOTE — Plan of Care (Signed)

## 2023-08-19 NOTE — Discharge Summary (Signed)
 [facility] Discharge Summary   Patient: Tonya Myers:[id number]  PCP: Tonya San, MD  Date of admission: 08/12/2023   Date of discharge:  08/19/2023     Discharge Diagnoses:  Principal Problem:   Thrombocytopenia (HCC) Active Problems:   Failure to thrive in adult/frailty   Type 2 diabetes mellitus with peripheral neuropathy (HCC)   End-stage renal disease on hemodialysis (HCC)   Left patella fracture   Type II diabetes mellitus with renal manifestations (HCC)   Anemia in ESRD (end-stage renal disease) (HCC)   Admitted From: Home Disposition:  Home home health services  Recommendations for Outpatient Follow-up:  F/u PCP in 1 wk, monitor fingerstick glucose and titrate dose of insulin  accordingly.  Patient remains at high risk for hyperglycemia secondary to steroids.  Patient verbalized understanding and agreed to use insulin . F/u Oncologist in 1 wk, started prednisone  tapering dose for 3 weeks F/u Nephro for HD Follow up LABS/TEST: CBC in 1 week   Follow-up Information     Tonya San, MD Follow up in 1 week(s).   Specialty: Family Medicine Contact information: [address] [facility] [place] [address] [phone]         Tonya Dials, MD Follow up in 1 week(s).   Specialty: Oncology Contact information: [address] [place] [address] [phone]                Diet recommendation: Cardiac and Carb modified diet  Activity: The patient is advised to gradually reintroduce usual activities, as tolerated  Discharge Condition: stable  Code Status: Full code   History of present illness: As per the H and P dictated on admission.  Hospital Course:    HPI was taken from Dr. Vallarie Myers: Tonya Myers is a 58 y.o. 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 (~2022) and protracted illness course with multiple admissions for encephalopathy and sepsis, now functionally much improved though with chronic neuropathy admitted to [facility] 4/4-4/9 for presyncope attributed to orthostatic hypotension discharged home with home health on midodrine , rehospitalized at [facility] 4/10-4/13 with weakness and thrombocytopenia, following up with hematology on 4/18, now sent from hematology office for inability to function at home in the setting of worsening thrombocytopenia for which she was started on a 7-day prednisone  burst. Patient has multiple vague complaints mostly notable for bilateral knee pain.  Oncology note reviewed.  Patient had a bone marrow biopsy on 4/9 that was unrevealing. ED course and data review: Vitals within normal limits Labs notable for pancytopenia with stable thrombocytopenia of 26,000.  Urinalysis with moderate leukocyte esterase EKG sinus at 82 with no ischemic changes X-rays bilateral knees showed a left patella fracture   Patient placed in a knee immobilizer Started on ceftriaxone  for possible UTI Treated with pain medication   Hospitalist consulted for admission.      Assessment & Plan:   # Thrombocytopenia: etiology unclear. Trending up again today.  Recent bone marrow biopsy was unremarkable. 4/25 Plts 46k, remained stable.  No active bleeding.s/p prednisone  80 mg p.o. daily x 7 days. D/w hematologist, recommended slowly tapering steroids over 3 weeks and follow-up as an outpatient in 1 week, repeat CBC in 1 week.   # DM2: HbA1c 5.4, well controlled. S/p SSI w/ accuchecks while on steroids  Patient remained at high risk for hypoglycemia.  Discharged on Semglee  10 units twice daily and NovoLog  sliding scale.  Supplies given, advised to monitor FSBG at home, continue diabetic diet and follow with PCP for further management.  # Generalized weakness: PT/OT recs SNF. Pt was agreeable to SNF. CM was working on SNF  placement.  Patient had multiple recent hospitalizations.  During hospital stay gradually patient's condition improved and patient decided to go home.  Home health was arranged by case manager.   # ESRD:  on HD MWF. Nephro following and recs apprec  # ACD: likely secondary to ESRD. Will transfuse if Hb < 7.0  # Hypokalemia: WNL today  # Left patella fracture: continue w/ knee immobilizer prn for comfort. Weightbearing as tolerated. F/u outpatient w/ ortho surg # Orthostatic hypotension: s/p midodrine .  As per med rec patient was not taking midodrine  at home so discontinued on discharge.  Monitor BP at home and follow with PCP. # Hx of CLL: dx 2016. Recent bone marrow biopsy on 08/03/23 unremarkable # UTI ruled out: urine cx shows no growth. Completed 3 day course of abxs  # HSV: w/ acute flare. Continue on valtrex   # Possible vaginal yeast infection: s/p fluconazole  x 2  # Hypothyroidism: continue on levothyroxine   # Hx of seizures: pt stopped taking vimpat  in 2024 herself # Constipation, started laxatives.  Body mass index is 25.92 kg/m.  Nutrition Interventions:  Tonya Myers- Patient was instructed, not to drive, operate heavy machinery, perform activities at heights, swimming or participation in water activities or provide baby sitting services while on Pain, Sleep and Anxiety Medications; until her outpatient Physician has advised to do so again.  - Also recommended to not to take more than prescribed Pain, Sleep and Anxiety Medications.  Patient was seen by physical therapy, who recommended Home health, which was arranged. On the day of the discharge the patient's vitals were stable, and no other acute medical condition were reported by patient. the patient was felt safe to be discharge at Home with Home health.  Consultants: Nephrology, oncologist Procedures:   Discharge Exam: General: Appear in no distress, no Rash; Oral Mucosa Clear, moist. Cardiovascular: S1 and S2 Present, no  Murmur, Respiratory: normal respiratory effort, Bilateral Air entry present and no Crackles, no wheezes Abdomen: Bowel Sound present, Soft and no tenderness, no hernia Extremities: no Pedal edema, no calf tenderness Neurology: alert and oriented to time, place, and person affect appropriate.  Filed Weights   08/17/23 1326 08/19/23 0802 08/19/23 1230  Weight: 88.1 kg 88.3 kg 86.7 kg   Vitals:   08/19/23 1230 08/19/23 1311  BP: 119/66 118/76  Pulse: 83 66  Resp: 12 20  Temp:  98.4 F (36.9 C)  SpO2: (!) 83% 98%    DISCHARGE MEDICATION: Allergies as of 08/19/2023       Reactions   Amoxapine    Other Reaction(s): Unknown   Latex    Ceftazidime    Other reaction(s): Confusion, Hallucination, Unknown Encephalopathy - improved after dialysis/some concern for cephalosporin neurotoxicity Tolerated without confusion 06/2021. Encephalopathy - improved after dialysis/some concern for cephalosporin neurotoxicity Tolerated without confusion 06/2021.   Iodinated Contrast Media Hives   Other Reaction(s): Hives   Penicillins    Other Reaction(s): Unknown        Medication List     PAUSE taking these medications    aspirin  EC 81 MG tablet Wait to take this until your doctor or other care provider tells you to start again. Take 1 tablet (81 mg total) by mouth  daily. Swallow whole.   furosemide  80 MG tablet Wait to take this until your doctor or other care provider tells you to start again. Commonly known as: LASIX  Take 1 tablet by mouth daily.   telmisartan  80 MG tablet Wait to take this until your doctor or other care provider tells you to start again. Commonly known as: MICARDIS  Take 80 mg by mouth daily.       STOP taking these medications    amitriptyline  10 MG tablet Commonly known as: ELAVIL    midodrine  2.5 MG tablet Commonly known as: PROAMATINE        TAKE these medications    acetaminophen  325 MG tablet Commonly known as: TYLENOL  Take 2 tablets (650 mg  total) by mouth every 6 (six) hours as needed for mild pain (or Fever >/= 101).   albuterol  108 (90 Base) MCG/ACT inhaler Commonly known as: VENTOLIN  HFA Inhale 2 puffs into the lungs every 6 (six) hours as needed for wheezing or shortness of breath.   ascorbic acid  500 MG tablet Commonly known as: VITAMIN C  Take 1 tablet (500 mg total) by mouth 2 (two) times daily.   atorvastatin  20 MG tablet Commonly known as: LIPITOR Take 20 mg by mouth daily.   Blood Glucose Monitoring Suppl Devi 1 each by Does not apply route 3 (three) times daily. May dispense any manufacturer covered by patient's insurance.   BLOOD GLUCOSE TEST STRIPS Strp 1 each by Does not apply route 3 (three) times daily. Use as directed to check blood sugar. May dispense any manufacturer covered by patient's insurance and fits patient's device.   calcium  acetate 667 MG capsule Commonly known as: PHOSLO  Take 1,334 mg by mouth 3 (three) times daily with meals.   cetirizine 10 MG tablet Commonly known as: ZYRTEC Take 1 tablet by mouth daily.   Dexcom G6 Sensor Misc SMARTSIG:1 Each Topical Every 10 Days   fluticasone  50 MCG/ACT nasal spray Commonly known as: FLONASE  Place 2 sprays into both nostrils daily.   folic acid  1 MG tablet Commonly known as: FOLVITE  Take 1 tablet by mouth daily.   gabapentin  300 MG capsule Commonly known as: NEURONTIN  TAKE 1 CAPSULE BY MOUTH THREE TIMES A DAY   hydrOXYzine  25 MG tablet Commonly known as: ATARAX  Take 1 tablet (25 mg total) by mouth 3 (three) times daily as needed for itching. What changed: reasons to take this   insulin  aspart 100 UNIT/ML FlexPen Commonly known as: NOVOLOG  Inject 0-11 Units into the skin 3 (three) times daily with meals. Check Blood Glucose (BG) and inject per scale: BG <150= 0 unit; BG 150-200= 1 unit; BG 201-250= 3 unit; BG 251-300= 5 unit; BG 301-350= 7 unit; BG 351-400= 9 unit; BG >400= 11 unit and Call Primary Care.   insulin  glargine-yfgn 100  UNIT/ML Pen Commonly known as: SEMGLEE  Inject 10 Units into the skin 2 (two) times daily. May substitute as needed per insurance.   Lancet Device Misc 1 each by Does not apply route 3 (three) times daily. May dispense any manufacturer covered by patient's insurance.   Lancets Misc 1 each by Does not apply route 3 (three) times daily. Use as directed to check blood sugar. May dispense any manufacturer covered by patient's insurance and fits patient's device.   levothyroxine  125 MCG tablet Commonly known as: SYNTHROID  Take 125 mcg by mouth daily.   Lokelma  10 g Pack packet Generic drug: sodium zirconium cyclosilicate  Take 10 g by mouth once a week.   multivitamin Tabs tablet  Take 1 tablet by mouth at bedtime.   ondansetron  4 MG disintegrating tablet Commonly known as: ZOFRAN -ODT Take 1 tablet (4 mg total) by mouth every 8 (eight) hours as needed for nausea or vomiting.   pantoprazole  40 MG tablet Commonly known as: Protonix  Take 1 tablet (40 mg total) by mouth 2 (two) times daily for 28 days, THEN 1 tablet (40 mg total) daily. Start taking on: August 19, 2023 What changed: See the new instructions.   Pen Needles 31G X 5 MM Misc 1 each by Does not apply route 3 (three) times daily. May dispense any manufacturer covered by patient's insurance.   predniSONE  10 MG tablet Commonly known as: DELTASONE  Take 7 tablets (70 mg total) by mouth daily with breakfast for 3 days, THEN 6 tablets (60 mg total) daily with breakfast for 3 days, THEN 5 tablets (50 mg total) daily with breakfast for 3 days, THEN 4 tablets (40 mg total) daily with breakfast for 3 days, THEN 3 tablets (30 mg total) daily with breakfast for 3 days, THEN 2 tablets (20 mg total) daily with breakfast for 3 days, THEN 1 tablet (10 mg total) daily with breakfast for 3 days. Start taking on: August 20, 2023   valACYclovir  500 MG tablet Commonly known as: VALTREX  Take 1 tablet (500 mg total) by mouth daily for 10 days.    zinc  sulfate (50mg  elemental zinc ) 220 (50 Zn) MG capsule Take 1 capsule (220 mg total) by mouth daily.       Allergies  Allergen Reactions   Amoxapine     Other Reaction(s): Unknown   Latex    Ceftazidime     Other reaction(s): Confusion, Hallucination, Unknown Encephalopathy - improved after dialysis/some concern for cephalosporin neurotoxicity Tolerated without confusion 06/2021. Encephalopathy - improved after dialysis/some concern for cephalosporin neurotoxicity Tolerated without confusion 06/2021.    Iodinated Contrast Media Hives    Other Reaction(s): Hives   Penicillins     Other Reaction(s): Unknown   Discharge Instructions     Call MD for:   Complete by: As directed    Bleeding or Bruises   Call MD for:  difficulty breathing, headache or visual disturbances   Complete by: As directed    Call MD for:  extreme fatigue   Complete by: As directed    Call MD for:  persistant dizziness or light-headedness   Complete by: As directed    Call MD for:  persistant nausea and vomiting   Complete by: As directed    Call MD for:  severe uncontrolled pain   Complete by: As directed    Call MD for:  temperature >100.4   Complete by: As directed    Diet - low sodium heart healthy   Complete by: As directed    Discharge instructions   Complete by: As directed    F/u PCP in 1 wk, monitor fingerstick glucose and titrate dose of insulin  accordingly.  Patient remains at high risk for hyperglycemia secondary to steroids.  Patient verbalized understanding and agreed to use insulin . F/u Oncologist in 1 wk, started prednisone  tapering dose for 3 weeks F/u Nephro for HD   Increase activity slowly   Complete by: As directed        The results of significant diagnostics from this hospitalization (including imaging, microbiology, ancillary and laboratory) are listed below for reference.    Significant Diagnostic Studies: DG Knee Complete 4 Views Left Result Date:  08/12/2023 CLINICAL DATA:  Ground level fall. EXAM: LEFT  KNEE - COMPLETE 4+ VIEW COMPARISON:  None Available. FINDINGS: A small minimally displaced fracture fragment is seen along the dorsal lateral aspect of the left patella. There is no evidence of dislocation. A small area of medial cortical exostosis is seen along the supracondylar region of the distal left femur. Moderate severity tricompartmental degenerative changes are noted. A very small joint effusion is seen. IMPRESSION: 1. Findings suspicious for a small fracture of the dorsal lateral aspect of the left patella. Correlation with physical examination is recommended to determine the presence of point tenderness. 2. Mild cortical exostosis along the medial aspect of the distal left femur. 3. Moderate severity degenerative changes. 4. Very small joint effusion. Electronically Signed   By: Virgle Grime M.D.   On: 08/12/2023 19:50   DG Knee Complete 4 Views Right Result Date: 08/12/2023 CLINICAL DATA:  Ground level fall. EXAM: RIGHT KNEE - COMPLETE 4+ VIEW COMPARISON:  None Available. FINDINGS: No evidence of an acute fracture. Mild dorsal displacement of the right patella is noted which may be positional in nature. Moderate severity tricompartmental degenerative changes are noted. There is a small joint effusion. IMPRESSION: 1. No evidence of acute osseous abnormality. 2. Mild dorsal displacement of the right patella which may be positional in nature. Correlation with physical examination is recommended. 3. Moderate severity degenerative changes. 4. Small joint effusion. Electronically Signed   By: Virgle Grime M.D.   On: 08/12/2023 19:47   CT BONE MARROW BIOPSY & ASPIRATION Result Date: 08/03/2023 INDICATION: History of CLL now with new onset thrombocytopenia and leukopenia. EXAM: CT GUIDED BONE MARROW ASPIRATION AND CORE BIOPSY Interventional Radiologist:  Roxie Cord, MD MEDICATIONS: None. ANESTHESIA/SEDATION: Moderate (conscious)  sedation was employed during this procedure. A total of 1 milligrams versed  and 50 micrograms fentanyl  were administered intravenously administered by the radiology nurse. The patient's level of consciousness and vital signs were monitored continuously by radiology nursing throughout the procedure under my direct supervision. Total monitored sedation time: 7 minutes FLUOROSCOPY: None. COMPLICATIONS: None immediate. Estimated blood loss: <25 mL PROCEDURE: Informed written consent was obtained from the patient after a thorough discussion of the procedural risks, benefits and alternatives. All questions were addressed. Maximal Sterile Barrier Technique was utilized including caps, mask, sterile gowns, sterile gloves, sterile drape, hand hygiene and skin antiseptic. A timeout was performed prior to the initiation of the procedure. The patient was positioned prone and non-contrast localization CT was performed of the pelvis to demonstrate the iliac marrow spaces. Maximal barrier sterile technique utilized including caps, mask, sterile gowns, sterile gloves, large sterile drape, hand hygiene, and betadine prep. Under sterile conditions and local anesthesia, an 11 gauge coaxial bone biopsy needle was advanced into the left iliac marrow space. Needle position was confirmed with CT imaging. Initially, bone marrow aspiration was performed. Next, the 11 gauge outer cannula was utilized to obtain a left iliac bone marrow core biopsy. Needle was removed. Hemostasis was obtained with compression. The patient tolerated the procedure well. Samples were prepared with the cytotechnologist. IMPRESSION: Successful CT-guided left iliac bone marrow aspiration and core biopsy. Electronically Signed   By: Fernando Hoyer M.D.   On: 08/03/2023 12:37   DG Chest 2 View Result Date: 07/29/2023 CLINICAL DATA:  Weakness skip EXAM: CHEST - 2 VIEW COMPARISON:  Chest x-ray 12/12/2021 FINDINGS: The heart size and mediastinal contours are within  normal limits. Both lungs are clear. The visualized skeletal structures are unremarkable. IMPRESSION: No active cardiopulmonary disease. Electronically Signed   By: Egbert Grass  Naaman Au M.D.   On: 07/29/2023 20:34   MR BRAIN WO CONTRAST Result Date: 07/25/2023 CLINICAL DATA:  58 year old female with syncope, neurologic deficit, diarrhea. EXAM: MRI HEAD WITHOUT CONTRAST TECHNIQUE: Multiplanar, multiecho pulse sequences of the brain and surrounding structures were obtained without intravenous contrast. COMPARISON:  Brain MRI 03/29/2023 and earlier. FINDINGS: Brain: Overall cerebral volume remains normal for age. No restricted diffusion to suggest acute infarction. No midline shift, mass effect, evidence of mass lesion, ventriculomegaly, extra-axial collection or acute intracranial hemorrhage. Cervicomedullary junction and pituitary are within normal limits. No cortical encephalomalacia or chronic cerebral blood products identified. Patchy moderate for age and nonspecific cerebral white matter T2 and FLAIR hyperintensity is stable. Deep gray nuclei, brainstem and cerebellum remain within normal limits. Vascular: Major intracranial vascular flow voids are preserved, stable since December. Skull and upper cervical spine: Partially visible cervical spine degeneration. Background bone marrow signal remains within normal limits. Sinuses/Orbits: Stable, negative. Other: Grossly normal visible internal auditory structures. Negative visible scalp and face. IMPRESSION: 1. No acute intracranial abnormality. 2. Stable moderate for age nonspecific cerebral white matter signal changes, most commonly due to small vessel disease. Electronically Signed   By: Marlise Simpers M.D.   On: 07/25/2023 09:03   CT ABDOMEN PELVIS WO CONTRAST Result Date: 07/25/2023 CLINICAL DATA:  Acute, nonlocalized abdominal pain.  Diarrhea EXAM: CT ABDOMEN AND PELVIS WITHOUT CONTRAST TECHNIQUE: Multidetector CT imaging of the abdomen and pelvis was performed  following the standard protocol without IV contrast. RADIATION DOSE REDUCTION: This exam was performed according to the departmental dose-optimization program which includes automated exposure control, adjustment of the mA and/or kV according to patient size and/or use of iterative reconstruction technique. COMPARISON:  12/12/2021 FINDINGS: Lower chest:  Coronary atherosclerosis. Hepatobiliary: Low-density lesions in the subcapsular right lobe liver or subtle compared to 07/13/2021 comparison with no detected growth compatible with benign process.Physiologic distension of the gallbladder with layering gallstone. Pancreas: Scattered coarse calcifications attributed to old pancreatitis, no interval change. Spleen: Unremarkable. Adrenals/Urinary Tract: Negative adrenals. No hydronephrosis or stone. 15 mm cyst in the interpolar left kidney. Fairly symmetric renal atrophy with right kidney measuring up to 6 cm in length. Unremarkable bladder. Stomach/Bowel:  No obstruction. No appendicitis. Vascular/Lymphatic: No acute vascular abnormality. No mass or adenopathy. Reproductive:Hysterectomy. Other: No ascites or pneumoperitoneum. Musculoskeletal: No acute abnormalities. IMPRESSION: 1. No acute finding. 2. Cholelithiasis, chronic pancreatitis, and renal atrophy. Electronically Signed   By: Ronnette Coke M.D.   On: 07/25/2023 05:01    Microbiology: Recent Results (from the past 240 hours)  Urine Culture     Status: None   Collection Time: 08/12/23  6:33 PM   Specimen: Urine, Catheterized  Result Value Ref Range Status   Specimen Description   Final    URINE, CATHETERIZED Performed at [facility], [address], [place], [place] [address]    Special Requests   Final    NONE Performed at [name], [address], [place], [place] [address]    Culture   Final    NO GROWTH Performed at [facility], [address] [place], [place] [address]    Report Status 08/14/2023 FINAL   Final     Labs: CBC: Recent Labs  Lab 08/15/23 0526 08/16/23 0533 08/17/23 0535 08/18/23 0314 08/19/23 0827  WBC 7.4 7.7 6.0 6.2 6.4  HGB 9.5* 9.7* 10.0* 11.6* 10.2*  HCT 29.5* 30.1* 30.1* 36.1 30.7*  MCV 96.4 93.8 93.5 94.5 90.6  PLT 41* 47* 48* 46*  46*   Basic Metabolic Panel: Recent Labs  Lab 08/15/23 0526 08/16/23 0533 08/17/23 0535 08/18/23 0314 08/19/23 0828  NA 136 132* 132* 135 129*  K 4.7 4.5 4.5 5.0 5.0  CL 98 93* 96* 97* 95*  CO2 25 26 21* 27 22  GLUCOSE 246* 259* 214* 108* 119*  BUN 97* 73* 107* 70* 101*  CREATININE 9.97* 6.33* 8.01* 5.60* 7.26*  CALCIUM  8.7* 8.8* 8.7* 9.2 8.3*  MG  --   --  2.3 2.4  --   PHOS 6.3*  --   --  4.1 5.2*   Liver Function Tests: Recent Labs  Lab 08/19/23 0828  ALBUMIN 3.2*   No results for input(s): "LIPASE", "AMYLASE" in the last 168 hours. No results for input(s): "AMMONIA" in the last 168 hours. Cardiac Enzymes: No results for input(s): "CKTOTAL", "CKMB", "CKMBINDEX", "TROPONINI" in the last 168 hours. BNP (last 3 results) No results for input(s): "BNP" in the last 8760 hours. CBG: Recent Labs  Lab 08/18/23 1725 08/18/23 2101 08/18/23 2353 08/19/23 0442 08/19/23 1308  GLUCAP 305* 374* 285* 158* 115*    Time spent: 35 minutes  Signed:  Althia Atlas  [facility] 08/19/2023 2:34 PM

## 2023-08-19 NOTE — Progress Notes (Addendum)
 [facility]  ROUNDING NOTE   Subjective:   Tonya Myers is a 58 year old female with past medical history including depression, type 2 diabetes, asthma, hypertension, hypothyroidism, CLL, and end-stage renal disease on hemodialysis.  She presents to the emergency department with multiple falls. She has been admitted for Thrombocytopenia (HCC) [D69.6] Dyslipidemia [E78.5] Acute cystitis with hematuria [N30.01] Multiple falls [R29.6]  Patient is known our practice and receives outpatient dialysis treatments at [facility] on a MWF schedule, supervised by [facility] physicians.   Update:  Patient seen and evaluated during dialysis   HEMODIALYSIS FLOWSHEET:  Blood Flow Rate (mL/min): 299 mL/min Arterial Pressure (mmHg): -160.6 mmHg Venous Pressure (mmHg): 259.79 mmHg TMP (mmHg): 21.41 mmHg Ultrafiltration Rate (mL/min): 1030 mL/min Dialysate Flow Rate (mL/min): 300 ml/min Dialysis Fluid Bolus: Normal Saline  Complains of generalized itching of lower back, legs and arms   Objective:  Vital signs in last 24 hours:  Temp:  [97.6 F (36.4 C)-98 F (36.7 C)] 97.8 F (36.6 C) (04/25 0802) Pulse Rate:  [47-114] 114 (04/25 1100) Resp:  [10-22] 22 (04/25 1100) BP: (102-141)/(65-82) 102/76 (04/25 1100) SpO2:  [93 %-100 %] 93 % (04/25 1100) Weight:  [88.3 kg] 88.3 kg (04/25 0802)  Weight change:  Filed Weights   08/17/23 0833 08/17/23 1326 08/19/23 0802  Weight: 90 kg 88.1 kg 88.3 kg    Intake/Output: I/O last 3 completed shifts: In: 240 [P.O.:240] Out: -    Intake/Output this shift:  No intake/output data recorded.  Physical Exam: General: NAD  Head: Normocephalic, atraumatic. Moist oral mucosal membranes  Eyes: Anicteric  Lungs:  Clear to auscultation, normal effort  Heart: Regular rate and rhythm  Abdomen:  Soft, nontender  Extremities: No peripheral edema.  Neurologic: Alert and oriented moving all four extremities  Skin: No lesions   Access: Lt AVG    Basic Metabolic Panel: Recent Labs  Lab 08/15/23 0526 08/16/23 0533 08/17/23 0535 08/18/23 0314 08/19/23 0828  NA 136 132* 132* 135 129*  K 4.7 4.5 4.5 5.0 5.0  CL 98 93* 96* 97* 95*  CO2 25 26 21* 27 22  GLUCOSE 246* 259* 214* 108* 119*  BUN 97* 73* 107* 70* 101*  CREATININE 9.97* 6.33* 8.01* 5.60* 7.26*  CALCIUM  8.7* 8.8* 8.7* 9.2 8.3*  MG  --   --  2.3 2.4  --   PHOS 6.3*  --   --  4.1 5.2*    Liver Function Tests: Recent Labs  Lab 08/19/23 0828  ALBUMIN 3.2*    No results for input(s): "LIPASE", "AMYLASE" in the last 168 hours.  No results for input(s): "AMMONIA" in the last 168 hours.   CBC: Recent Labs  Lab 08/15/23 0526 08/16/23 0533 08/17/23 0535 08/18/23 0314 08/19/23 0827  WBC 7.4 7.7 6.0 6.2 6.4  HGB 9.5* 9.7* 10.0* 11.6* 10.2*  HCT 29.5* 30.1* 30.1* 36.1 30.7*  MCV 96.4 93.8 93.5 94.5 90.6  PLT 41* 47* 48* 46* 46*    Cardiac Enzymes: No results for input(s): "CKTOTAL", "CKMB", "CKMBINDEX", "TROPONINI" in the last 168 hours.  BNP: Invalid input(s): "POCBNP"  CBG: Recent Labs  Lab 08/18/23 1241 08/18/23 1725 08/18/23 2101 08/18/23 2353 08/19/23 0442  GLUCAP 222* 305* 374* 285* 158*     Microbiology: Results for orders placed or performed during the hospital encounter of 08/12/23  Urine Culture     Status: None   Collection Time: 08/12/23  6:33 PM   Specimen: Urine, Catheterized  Result Value Ref Range Status  Specimen Description   Final    URINE, CATHETERIZED Performed at [facility], [address] [place], [place] [address]    Special Requests   Final    NONE Performed at [facility], [address] [place], [place] [address]    Culture   Final    NO GROWTH Performed at [facility], [address] [place], [place] [address]    Report Status 08/14/2023 FINAL  Final    Coagulation Studies: No results for input(s): "LABPROT", "INR" in the last 72  hours.  Urinalysis: No results for input(s): "COLORURINE", "LABSPEC", "PHURINE", "GLUCOSEU", "HGBUR", "BILIRUBINUR", "KETONESUR", "PROTEINUR", "UROBILINOGEN", "NITRITE", "LEUKOCYTESUR" in the last 72 hours.  Invalid input(s): "APPERANCEUR"     Imaging: No results found.     Medications:       bisacodyl   10 mg Oral QHS   calcium  acetate  1,334 mg Oral TID WC   docusate sodium   200 mg Oral BID   insulin  aspart  0-20 Units Subcutaneous TID AC & HS   insulin  aspart  4 Units Subcutaneous TID WC   insulin  glargine-yfgn  10 Units Subcutaneous BID   levothyroxine   125 mcg Oral Q0600   melatonin  5 mg Oral QHS   pantoprazole   40 mg Oral Daily   polyethylene glycol  17 g Oral BID   predniSONE   80 mg Oral Q breakfast   valACYclovir   500 mg Oral Daily   acetaminophen  **OR** acetaminophen , alum & mag hydroxide-simeth, bisacodyl , camphor-menthol, diphenhydrAMINE , HYDROmorphone  (DILAUDID ) injection, ondansetron  **OR** ondansetron  (ZOFRAN ) IV, oxyCODONE   Assessment/ Plan:  [name] is a 58 y.o.  female  with past medical history including depression, type 2 diabetes, asthma, hypertension, hypothyroidism, CLL, and end-stage renal disease on hemodialysis.  She presents to the emergency department with fever and diarrhea. She has been admitted under observation for Thrombocytopenia (HCC) [D69.6] Dyslipidemia [E78.5] Acute cystitis with hematuria [N30.01] Multiple falls [R29.6]   [facility] [place]/MWF/right AVF/91 kg   End stage renal disease on hemodialysis. Receiving dialysis today, UF 1.5-2L as tolerated. Next treatment scheduled for Monday. Will defer discharge plan to primary team.   2. 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  Lab Results  Component Value Date   HGB 10.2 (L) 08/19/2023    Hemoglobin 10.2, acceptable. Mircera as outpatient.  3. Secondary Hyperparathyroidism: with outpatient labs: PTH 223, phosphorus 6.5, calcium  9.8 on 06/27/23.    Lab Results  Component Value Date   CALCIUM  8.3 (L) 08/19/2023   PHOS 5.2 (H) 08/19/2023  Currently prescribed calcitriol and calcium  acetate outpatient. Continue calcium  acetate 2 tablets p.o. 3 times daily with meals.  Will order Sarna lotion to manage pruritus.  Primary team considering Atarax .  4. 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 Most recent hemoglobin A1c is 5.6 on 07/29/23.   Primary team to manage SSI    LOS: 7 Braydan Marriott 4/25/202511:17 AM

## 2023-08-25 ENCOUNTER — Inpatient Hospital Stay: Admitting: Oncology

## 2023-08-25 ENCOUNTER — Inpatient Hospital Stay

## 2023-09-01 ENCOUNTER — Inpatient Hospital Stay: Attending: Oncology | Admitting: Oncology

## 2023-09-01 ENCOUNTER — Inpatient Hospital Stay

## 2023-09-01 ENCOUNTER — Encounter: Payer: Self-pay | Admitting: Oncology

## 2023-09-01 ENCOUNTER — Other Ambulatory Visit: Payer: Self-pay | Admitting: *Deleted

## 2023-09-01 VITALS — BP 119/67 | HR 67 | Temp 98.9°F | Resp 16 | Wt 207.0 lb

## 2023-09-01 DIAGNOSIS — Z809 Family history of malignant neoplasm, unspecified: Secondary | ICD-10-CM | POA: Insufficient documentation

## 2023-09-01 DIAGNOSIS — M899 Disorder of bone, unspecified: Secondary | ICD-10-CM | POA: Insufficient documentation

## 2023-09-01 DIAGNOSIS — Z803 Family history of malignant neoplasm of breast: Secondary | ICD-10-CM | POA: Insufficient documentation

## 2023-09-01 DIAGNOSIS — Z79899 Other long term (current) drug therapy: Secondary | ICD-10-CM | POA: Diagnosis not present

## 2023-09-01 DIAGNOSIS — D696 Thrombocytopenia, unspecified: Secondary | ICD-10-CM

## 2023-09-01 DIAGNOSIS — Z794 Long term (current) use of insulin: Secondary | ICD-10-CM | POA: Diagnosis not present

## 2023-09-01 DIAGNOSIS — E1142 Type 2 diabetes mellitus with diabetic polyneuropathy: Secondary | ICD-10-CM | POA: Diagnosis not present

## 2023-09-01 DIAGNOSIS — E039 Hypothyroidism, unspecified: Secondary | ICD-10-CM | POA: Diagnosis not present

## 2023-09-01 DIAGNOSIS — M2540 Effusion, unspecified joint: Secondary | ICD-10-CM | POA: Insufficient documentation

## 2023-09-01 DIAGNOSIS — C9111 Chronic lymphocytic leukemia of B-cell type in remission: Secondary | ICD-10-CM | POA: Insufficient documentation

## 2023-09-01 DIAGNOSIS — D693 Immune thrombocytopenic purpura: Secondary | ICD-10-CM | POA: Insufficient documentation

## 2023-09-01 DIAGNOSIS — E1122 Type 2 diabetes mellitus with diabetic chronic kidney disease: Secondary | ICD-10-CM | POA: Insufficient documentation

## 2023-09-01 DIAGNOSIS — Z7982 Long term (current) use of aspirin: Secondary | ICD-10-CM | POA: Diagnosis not present

## 2023-09-01 DIAGNOSIS — N186 End stage renal disease: Secondary | ICD-10-CM | POA: Insufficient documentation

## 2023-09-01 DIAGNOSIS — Z8719 Personal history of other diseases of the digestive system: Secondary | ICD-10-CM | POA: Diagnosis not present

## 2023-09-01 DIAGNOSIS — G629 Polyneuropathy, unspecified: Secondary | ICD-10-CM | POA: Insufficient documentation

## 2023-09-01 DIAGNOSIS — I120 Hypertensive chronic kidney disease with stage 5 chronic kidney disease or end stage renal disease: Secondary | ICD-10-CM | POA: Insufficient documentation

## 2023-09-01 DIAGNOSIS — C9110 Chronic lymphocytic leukemia of B-cell type not having achieved remission: Secondary | ICD-10-CM | POA: Diagnosis present

## 2023-09-01 DIAGNOSIS — Z79890 Hormone replacement therapy (postmenopausal): Secondary | ICD-10-CM | POA: Diagnosis not present

## 2023-09-01 DIAGNOSIS — R7989 Other specified abnormal findings of blood chemistry: Secondary | ICD-10-CM | POA: Insufficient documentation

## 2023-09-01 LAB — CBC WITH DIFFERENTIAL/PLATELET
Abs Immature Granulocytes: 0.05 10*3/uL (ref 0.00–0.07)
Basophils Absolute: 0 10*3/uL (ref 0.0–0.1)
Basophils Relative: 1 %
Eosinophils Absolute: 0 10*3/uL (ref 0.0–0.5)
Eosinophils Relative: 1 %
HCT: 27.4 % — ABNORMAL LOW (ref 36.0–46.0)
Hemoglobin: 8.6 g/dL — ABNORMAL LOW (ref 12.0–15.0)
Immature Granulocytes: 1 %
Lymphocytes Relative: 19 %
Lymphs Abs: 0.8 10*3/uL (ref 0.7–4.0)
MCH: 31.4 pg (ref 26.0–34.0)
MCHC: 31.4 g/dL (ref 30.0–36.0)
MCV: 100 fL (ref 80.0–100.0)
Monocytes Absolute: 0.2 10*3/uL (ref 0.1–1.0)
Monocytes Relative: 5 %
Neutro Abs: 3 10*3/uL (ref 1.7–7.7)
Neutrophils Relative %: 73 %
Platelets: 80 10*3/uL — ABNORMAL LOW (ref 150–400)
RBC: 2.74 MIL/uL — ABNORMAL LOW (ref 3.87–5.11)
RDW: 21.2 % — ABNORMAL HIGH (ref 11.5–15.5)
WBC: 4.1 10*3/uL (ref 4.0–10.5)
nRBC: 0 % (ref 0.0–0.2)

## 2023-09-01 LAB — IMMATURE PLATELET FRACTION: Immature Platelet Fraction: 2.8 % (ref 1.2–8.6)

## 2023-09-01 MED ORDER — TRAMADOL HCL 50 MG PO TABS: 50.0000 mg | ORAL_TABLET | Freq: Three times a day (TID) | ORAL | 0 refills | Status: DC | PRN

## 2023-09-01 NOTE — Progress Notes (Signed)
 [facility]  [phone]) [phone] [phone]  ID: Tonya Myers OB: 05-Jul-1965  MR#: [id number]  AOZ#:[id number]  Patient Care Team: Lyle San, MD as PCP - General (Family Medicine) Shellie Dials, MD as Consulting Physician (Oncology)  CHIEF COMPLAINT: History of CLL, ITP.  INTERVAL HISTORY: Patient returns to clinic today for repeat laboratory work and further evaluation.  She feels significantly improved and nearly back to her baseline.  She continues to have a chronic peripheral neuropathy, but no other neurologic complaints.  She denies any further fevers.  She has a fair appetite, but denies weight loss.  She does not complain of any night sweats or other B symptoms.  She has no chest pain, shortness of breath, cough, or hemoptysis.  She denies any nausea, vomiting, constipation, or diarrhea.  She has no urinary complaints.  Patient offers no further specific complaints today.  REVIEW OF SYSTEMS:   Review of Systems  Constitutional:  Positive for malaise/fatigue. Negative for fever and weight loss.  Respiratory: Negative.  Negative for cough, hemoptysis and shortness of breath.   Cardiovascular: Negative.  Negative for chest pain and leg swelling.  Gastrointestinal: Negative.  Negative for abdominal pain.  Genitourinary: Negative.  Negative for dysuria.  Musculoskeletal: Negative.  Negative for back pain.  Skin: Negative.  Negative for itching and rash.  Neurological:  Positive for sensory change and weakness. Negative for dizziness, focal weakness and headaches.  Psychiatric/Behavioral: Negative.  The patient is not nervous/anxious.     As per HPI. Otherwise, a complete review of systems is negative.  PAST MEDICAL HISTORY: Past Medical History:  Diagnosis Date   Acute hemorrhoid 03/11/2015   Anxiety    Arthritis    Asthma    Cancer (HCC)    Chronic back pain    CLL (chronic lymphocytic leukemia) (HCC)    Depression     Diabetes mellitus without complication (HCC)    Genital herpes    type 2   Hypertension    Hypothyroidism    Vertigo     PAST SURGICAL HISTORY: Past Surgical History:  Procedure Laterality Date   ABDOMINAL HYSTERECTOMY     BILATERAL SALPINGOOPHORECTOMY  2009   BREAST BIOPSY Right 05/17/2016   FIBROADENOMATOUS CHANGE AND SCLEROSING ADENOSIS WITH   COLONOSCOPY WITH PROPOFOL  N/A 06/16/2015   Procedure: COLONOSCOPY WITH PROPOFOL ;  Surgeon: Luella Sager, MD;  Location: [facility] ENDOSCOPY;  Service: Endoscopy;  Laterality: N/A;   DIALYSIS/PERMA CATHETER INSERTION N/A 12/17/2020   Procedure: DIALYSIS/PERMA CATHETER INSERTION;  Surgeon: Celso College, MD;  Location: [facility] INVASIVE CV LAB;  Service: Cardiovascular;  Laterality: N/A;   LAPAROSCOPIC SUPRACERVICAL HYSTERECTOMY  2009   due to leio   OOPHORECTOMY     TEMPORARY DIALYSIS CATHETER N/A 12/10/2020   Procedure: TEMPORARY DIALYSIS CATHETER;  Surgeon: Jackquelyn Mass, MD;  Location: [facility] INVASIVE CV LAB;  Service: Cardiovascular;  Laterality: N/A;   THORACIC LAMINECTOMY FOR EPIDURAL ABSCESS Bilateral 06/17/2020   Procedure: THORACIC LAMINECTOMY FOR EPIDURAL ABSCESS;  Surgeon: Berta Brittle, MD;  Location: [facility] ORS;  Service: Neurosurgery;  Laterality: Bilateral;    FAMILY HISTORY: Family History  Problem Relation Age of Onset   Cancer Paternal Aunt    Breast cancer Maternal Aunt 6    ADVANCED DIRECTIVES (Y/N):  N  HEALTH MAINTENANCE: Social History   Tobacco Use   Smoking status: Never   Smokeless tobacco: Never  Vaping Use   Vaping status: Never Used  Substance Use Topics   Alcohol use: Not  Currently    Alcohol/week: 1.0 standard drink of alcohol    Types: 1 Cans of beer per week   Drug use: No     Colonoscopy:  PAP:  Bone density:  Lipid panel:  Allergies  Allergen Reactions   Amoxapine     Other Reaction(s): Unknown   Latex    Ceftazidime     Other reaction(s): Confusion, Hallucination,  Unknown Encephalopathy - improved after dialysis/some concern for cephalosporin neurotoxicity Tolerated without confusion 06/2021. Encephalopathy - improved after dialysis/some concern for cephalosporin neurotoxicity Tolerated without confusion 06/2021.    Iodinated Contrast Media Hives    Other Reaction(s): Hives   Penicillins     Other Reaction(s): Unknown    Current Outpatient Medications  Medication Sig Dispense Refill   acetaminophen  (TYLENOL ) 325 MG tablet Take 2 tablets (650 mg total) by mouth every 6 (six) hours as needed for mild pain (or Fever >/= 101).     albuterol  (VENTOLIN  HFA) 108 (90 Base) MCG/ACT inhaler Inhale 2 puffs into the lungs every 6 (six) hours as needed for wheezing or shortness of breath.     ascorbic acid  (VITAMIN C ) 500 MG tablet Take 1 tablet (500 mg total) by mouth 2 (two) times daily.     [Paused] aspirin  EC 81 MG tablet Take 1 tablet (81 mg total) by mouth daily. Swallow whole. 30 tablet 11   atorvastatin  (LIPITOR) 20 MG tablet Take 20 mg by mouth daily.     Blood Glucose Monitoring Suppl DEVI 1 each by Does not apply route 3 (three) times daily. May dispense any manufacturer covered by patient's insurance. 1 each 0   calcium  acetate (PHOSLO ) 667 MG capsule Take 1,334 mg by mouth 3 (three) times daily with meals.     cetirizine (ZYRTEC) 10 MG tablet Take 1 tablet by mouth daily.     Continuous Blood Gluc Sensor (DEXCOM G6 SENSOR) MISC SMARTSIG:1 Each Topical Every 10 Days     fluticasone  (FLONASE ) 50 MCG/ACT nasal spray Place 2 sprays into both nostrils daily.     folic acid  (FOLVITE ) 1 MG tablet Take 1 tablet by mouth daily.     gabapentin  (NEURONTIN ) 300 MG capsule TAKE 1 CAPSULE BY MOUTH THREE TIMES A DAY 90 capsule 3   Glucose Blood (BLOOD GLUCOSE TEST STRIPS) STRP 1 each by Does not apply route 3 (three) times daily. Use as directed to check blood sugar. May dispense any manufacturer covered by patient's insurance and fits patient's device. 100 strip 0    hydrOXYzine  (ATARAX ) 25 MG tablet Take 1 tablet (25 mg total) by mouth 3 (three) times daily as needed for itching. 30 tablet 0   insulin  aspart (NOVOLOG ) 100 UNIT/ML FlexPen Inject 0-11 Units into the skin 3 (three) times daily with meals. Check Blood Glucose (BG) and inject per scale: BG <150= 0 unit; BG 150-200= 1 unit; BG 201-250= 3 unit; BG 251-300= 5 unit; BG 301-350= 7 unit; BG 351-400= 9 unit; BG >400= 11 unit and Call Primary Care. 15 mL 0   insulin  glargine-yfgn (SEMGLEE ) 100 UNIT/ML Pen Inject 10 Units into the skin 2 (two) times daily. May substitute as needed per insurance. 15 mL 2   Insulin  Pen Needle (PEN NEEDLES) 31G X 5 MM MISC 1 each by Does not apply route 3 (three) times daily. May dispense any manufacturer covered by patient's insurance. 100 each 0   Lancet Device MISC 1 each by Does not apply route 3 (three) times daily. May dispense any manufacturer covered by  patient's insurance. 1 each 0   Lancets MISC 1 each by Does not apply route 3 (three) times daily. Use as directed to check blood sugar. May dispense any manufacturer covered by patient's insurance and fits patient's device. 100 each 0   levothyroxine  (SYNTHROID ) 125 MCG tablet Take 125 mcg by mouth daily.     LOKELMA  10 g PACK packet Take 10 g by mouth once a week.     multivitamin (RENA-VIT) TABS tablet Take 1 tablet by mouth at bedtime.  0   ondansetron  (ZOFRAN -ODT) 4 MG disintegrating tablet Take 1 tablet (4 mg total) by mouth every 8 (eight) hours as needed for nausea or vomiting. 20 tablet 1   pantoprazole  (PROTONIX ) 40 MG tablet Take 1 tablet (40 mg total) by mouth 2 (two) times daily for 28 days, THEN 1 tablet (40 mg total) daily. 30 tablet 1   predniSONE  (DELTASONE ) 10 MG tablet Take 7 tablets (70 mg total) by mouth daily with breakfast for 3 days, THEN 6 tablets (60 mg total) daily with breakfast for 3 days, THEN 5 tablets (50 mg total) daily with breakfast for 3 days, THEN 4 tablets (40 mg total) daily with  breakfast for 3 days, THEN 3 tablets (30 mg total) daily with breakfast for 3 days, THEN 2 tablets (20 mg total) daily with breakfast for 3 days, THEN 1 tablet (10 mg total) daily with breakfast for 3 days. 84 tablet 0   zinc  sulfate 220 (50 Zn) MG capsule Take 1 capsule (220 mg total) by mouth daily.     [Paused] furosemide  (LASIX ) 80 MG tablet Take 1 tablet by mouth daily. (Patient not taking: Reported on 08/12/2023)     [Paused] telmisartan  (MICARDIS ) 80 MG tablet Take 80 mg by mouth daily. (Patient not taking: Reported on 08/12/2023)     traMADol  (ULTRAM ) 50 MG tablet Take 1 tablet (50 mg total) by mouth every 8 (eight) hours as needed. 90 tablet 0   No current facility-administered medications for this visit.    OBJECTIVE: Vitals:   09/01/23 0918  BP: 119/67  Pulse: 67  Resp: 16  Temp: 98.9 F (37.2 C)  SpO2: 96%      Body mass index is 28.07 kg/m.    ECOG FS:0 - Asymptomatic  General: Well-developed, well-nourished, no acute distress. Eyes: Pink conjunctiva, anicteric sclera. HEENT: Normocephalic, moist mucous membranes. Lungs: No audible wheezing or coughing. Heart: Regular rate and rhythm. Abdomen: Soft, nontender, no obvious distention. Musculoskeletal: No edema, cyanosis, or clubbing. Neuro: Alert, answering all questions appropriately. Cranial nerves grossly intact. Skin: No rashes or petechiae noted. Psych: Normal affect.  LAB RESULTS:  Lab Results  Component Value Date   NA 129 (L) 08/19/2023   K 5.0 08/19/2023   CL 95 (L) 08/19/2023   CO2 22 08/19/2023   GLUCOSE 119 (H) 08/19/2023   BUN 101 (H) 08/19/2023   CREATININE 7.26 (H) 08/19/2023   CALCIUM  8.3 (L) 08/19/2023   PROT 7.9 07/29/2023   ALBUMIN 3.2 (L) 08/19/2023   AST 36 07/29/2023   ALT 21 07/29/2023   ALKPHOS 63 07/29/2023   BILITOT 1.4 (H) 07/29/2023   GFRNONAA 6 (L) 08/19/2023   GFRAA >60 01/17/2020    Lab Results  Component Value Date   WBC 4.1 09/01/2023   NEUTROABS 3.0 09/01/2023   HGB  8.6 (L) 09/01/2023   HCT 27.4 (L) 09/01/2023   MCV 100.0 09/01/2023   PLT 80 (L) 09/01/2023     STUDIES: DG Knee Complete 4 Views  Left Result Date: 08/12/2023 CLINICAL DATA:  Ground level fall. EXAM: LEFT KNEE - COMPLETE 4+ VIEW COMPARISON:  None Available. FINDINGS: A small minimally displaced fracture fragment is seen along the dorsal lateral aspect of the left patella. There is no evidence of dislocation. A small area of medial cortical exostosis is seen along the supracondylar region of the distal left femur. Moderate severity tricompartmental degenerative changes are noted. A very small joint effusion is seen. IMPRESSION: 1. Findings suspicious for a small fracture of the dorsal lateral aspect of the left patella. Correlation with physical examination is recommended to determine the presence of point tenderness. 2. Mild cortical exostosis along the medial aspect of the distal left femur. 3. Moderate severity degenerative changes. 4. Very small joint effusion. Electronically Signed   By: Virgle Grime M.D.   On: 08/12/2023 19:50   DG Knee Complete 4 Views Right Result Date: 08/12/2023 CLINICAL DATA:  Ground level fall. EXAM: RIGHT KNEE - COMPLETE 4+ VIEW COMPARISON:  None Available. FINDINGS: No evidence of an acute fracture. Mild dorsal displacement of the right patella is noted which may be positional in nature. Moderate severity tricompartmental degenerative changes are noted. There is a small joint effusion. IMPRESSION: 1. No evidence of acute osseous abnormality. 2. Mild dorsal displacement of the right patella which may be positional in nature. Correlation with physical examination is recommended. 3. Moderate severity degenerative changes. 4. Small joint effusion. Electronically Signed   By: Virgle Grime M.D.   On: 08/12/2023 19:47   CT BONE MARROW BIOPSY & ASPIRATION Result Date: 08/03/2023 INDICATION: History of CLL now with new onset thrombocytopenia and leukopenia. EXAM: CT  GUIDED BONE MARROW ASPIRATION AND CORE BIOPSY Interventional Radiologist:  Roxie Cord, MD MEDICATIONS: None. ANESTHESIA/SEDATION: Moderate (conscious) sedation was employed during this procedure. A total of 1 milligrams versed  and 50 micrograms fentanyl  were administered intravenously administered by the radiology nurse. The patient's level of consciousness and vital signs were monitored continuously by radiology nursing throughout the procedure under my direct supervision. Total monitored sedation time: 7 minutes FLUOROSCOPY: None. COMPLICATIONS: None immediate. Estimated blood loss: <25 mL PROCEDURE: Informed written consent was obtained from the patient after a thorough discussion of the procedural risks, benefits and alternatives. All questions were addressed. Maximal Sterile Barrier Technique was utilized including caps, mask, sterile gowns, sterile gloves, sterile drape, hand hygiene and skin antiseptic. A timeout was performed prior to the initiation of the procedure. The patient was positioned prone and non-contrast localization CT was performed of the pelvis to demonstrate the iliac marrow spaces. Maximal barrier sterile technique utilized including caps, mask, sterile gowns, sterile gloves, large sterile drape, hand hygiene, and betadine prep. Under sterile conditions and local anesthesia, an 11 gauge coaxial bone biopsy needle was advanced into the left iliac marrow space. Needle position was confirmed with CT imaging. Initially, bone marrow aspiration was performed. Next, the 11 gauge outer cannula was utilized to obtain a left iliac bone marrow core biopsy. Needle was removed. Hemostasis was obtained with compression. The patient tolerated the procedure well. Samples were prepared with the cytotechnologist. IMPRESSION: Successful CT-guided left iliac bone marrow aspiration and core biopsy. Electronically Signed   By: Fernando Hoyer M.D.   On: 08/03/2023 12:37    ASSESSMENT: History of  CLL, ITP  PLAN:    CLL: In complete remission.  Bone marrow biopsy on August 03, 2023 did not reveal any monoclonal B-cell population and no other significant pathology.  Cytogenetics was reported as normal.  Her  total white blood cell count is within normal limits and peripheral blood flow cytometry is negative.  CT scan of the abdomen and pelvis on July 25, 2023 did not reveal any lymphadenopathy.  She last received treatment with Rituxan  and Treanda  on April 09, 2015.  No intervention is needed at this time.   Anemia: Hemoglobin is trended down to 8.6.  Recent iron stores are within normal limits.  Patient may benefit from Retacrit  in the future. Elevated ferritin: Likely secondary to acute phase reactant from dialysis.   ITP:  Patient's platelet count was within normal limits between August 2023 and January 2024.  Between October and December 2024 patient's platelet count declined slightly to 120s to 130s.  More recently, her platelet count trended down to 10, but on extended prednisone  taper has increased to 80.  Bone marrow biopsy results negative as above.  All of her laboratory work including SPEP are either no or within normal limits.  Patient will complete her prednisone  taper in the next several days.  If the response is not durable, we will consider weekly Rituxan  x 4.  Return to clinic in 2 weeks for laboratory work only and then in 4 weeks for laboratory work and further evaluation. End-stage renal disease: Patient reports she receives dialysis on Monday, Wednesdays, and Fridays.  Patient should be eligible for kidney transplant from a hematology standpoint.   Patient expressed understanding and was in agreement with this plan. She also understands that She can call clinic at any time with any questions, concerns, or complaints.    Shellie Dials, MD   09/01/2023 4:19 PM

## 2023-09-15 ENCOUNTER — Inpatient Hospital Stay

## 2023-09-15 DIAGNOSIS — D696 Thrombocytopenia, unspecified: Secondary | ICD-10-CM

## 2023-09-15 DIAGNOSIS — C9111 Chronic lymphocytic leukemia of B-cell type in remission: Secondary | ICD-10-CM | POA: Diagnosis not present

## 2023-09-15 LAB — CBC WITH DIFFERENTIAL/PLATELET
Abs Immature Granulocytes: 0.04 10*3/uL (ref 0.00–0.07)
Basophils Absolute: 0 10*3/uL (ref 0.0–0.1)
Basophils Relative: 1 %
Eosinophils Absolute: 0.1 10*3/uL (ref 0.0–0.5)
Eosinophils Relative: 1 %
HCT: 28.2 % — ABNORMAL LOW (ref 36.0–46.0)
Hemoglobin: 9 g/dL — ABNORMAL LOW (ref 12.0–15.0)
Immature Granulocytes: 1 %
Lymphocytes Relative: 23 %
Lymphs Abs: 0.9 10*3/uL (ref 0.7–4.0)
MCH: 32.5 pg (ref 26.0–34.0)
MCHC: 31.9 g/dL (ref 30.0–36.0)
MCV: 101.8 fL — ABNORMAL HIGH (ref 80.0–100.0)
Monocytes Absolute: 0.3 10*3/uL (ref 0.1–1.0)
Monocytes Relative: 7 %
Neutro Abs: 2.6 10*3/uL (ref 1.7–7.7)
Neutrophils Relative %: 67 %
Platelets: 95 10*3/uL — ABNORMAL LOW (ref 150–400)
RBC: 2.77 MIL/uL — ABNORMAL LOW (ref 3.87–5.11)
RDW: 19.8 % — ABNORMAL HIGH (ref 11.5–15.5)
WBC: 3.9 10*3/uL — ABNORMAL LOW (ref 4.0–10.5)
nRBC: 0 % (ref 0.0–0.2)

## 2023-09-28 ENCOUNTER — Other Ambulatory Visit: Payer: Self-pay | Admitting: *Deleted

## 2023-09-28 DIAGNOSIS — D696 Thrombocytopenia, unspecified: Secondary | ICD-10-CM

## 2023-09-28 DIAGNOSIS — C9110 Chronic lymphocytic leukemia of B-cell type not having achieved remission: Secondary | ICD-10-CM

## 2023-09-29 ENCOUNTER — Inpatient Hospital Stay: Attending: Oncology

## 2023-09-29 ENCOUNTER — Encounter: Payer: Self-pay | Admitting: Oncology

## 2023-09-29 ENCOUNTER — Inpatient Hospital Stay: Admitting: Oncology

## 2023-11-01 ENCOUNTER — Telehealth: Payer: Self-pay | Admitting: *Deleted

## 2023-11-01 NOTE — Telephone Encounter (Addendum)
 Patient called and said that she needs to be seen and get treatment.  She says that her white blood cells are at 2.8 and she was told that when it gets that low that she needs to get the come in and get treatment  OF CLL and ITP .the labs at [facility] neut-1.9, lymph-0.6. I tried to call her to say that I am sending message to the but [place] team and her voicemail is full and you cannot leave a message

## 2023-11-04 ENCOUNTER — Other Ambulatory Visit: Payer: Self-pay | Admitting: *Deleted

## 2023-11-04 ENCOUNTER — Encounter: Payer: Self-pay | Admitting: Oncology

## 2023-11-04 ENCOUNTER — Inpatient Hospital Stay (HOSPITAL_BASED_OUTPATIENT_CLINIC_OR_DEPARTMENT_OTHER): Admitting: Oncology

## 2023-11-04 ENCOUNTER — Inpatient Hospital Stay: Attending: Oncology

## 2023-11-04 VITALS — BP 115/72 | HR 84 | Temp 98.2°F | Resp 16 | Wt 203.0 lb

## 2023-11-04 DIAGNOSIS — E039 Hypothyroidism, unspecified: Secondary | ICD-10-CM | POA: Insufficient documentation

## 2023-11-04 DIAGNOSIS — I120 Hypertensive chronic kidney disease with stage 5 chronic kidney disease or end stage renal disease: Secondary | ICD-10-CM | POA: Insufficient documentation

## 2023-11-04 DIAGNOSIS — R197 Diarrhea, unspecified: Secondary | ICD-10-CM | POA: Diagnosis not present

## 2023-11-04 DIAGNOSIS — Z794 Long term (current) use of insulin: Secondary | ICD-10-CM | POA: Insufficient documentation

## 2023-11-04 DIAGNOSIS — Z7982 Long term (current) use of aspirin: Secondary | ICD-10-CM | POA: Insufficient documentation

## 2023-11-04 DIAGNOSIS — E1142 Type 2 diabetes mellitus with diabetic polyneuropathy: Secondary | ICD-10-CM | POA: Insufficient documentation

## 2023-11-04 DIAGNOSIS — Z992 Dependence on renal dialysis: Secondary | ICD-10-CM | POA: Diagnosis not present

## 2023-11-04 DIAGNOSIS — C9110 Chronic lymphocytic leukemia of B-cell type not having achieved remission: Secondary | ICD-10-CM | POA: Diagnosis not present

## 2023-11-04 DIAGNOSIS — R7989 Other specified abnormal findings of blood chemistry: Secondary | ICD-10-CM | POA: Diagnosis not present

## 2023-11-04 DIAGNOSIS — R1115 Cyclical vomiting syndrome unrelated to migraine: Secondary | ICD-10-CM | POA: Diagnosis not present

## 2023-11-04 DIAGNOSIS — J45909 Unspecified asthma, uncomplicated: Secondary | ICD-10-CM | POA: Diagnosis not present

## 2023-11-04 DIAGNOSIS — D693 Immune thrombocytopenic purpura: Secondary | ICD-10-CM | POA: Insufficient documentation

## 2023-11-04 DIAGNOSIS — Z803 Family history of malignant neoplasm of breast: Secondary | ICD-10-CM | POA: Diagnosis not present

## 2023-11-04 DIAGNOSIS — F419 Anxiety disorder, unspecified: Secondary | ICD-10-CM | POA: Diagnosis not present

## 2023-11-04 DIAGNOSIS — Z79890 Hormone replacement therapy (postmenopausal): Secondary | ICD-10-CM | POA: Insufficient documentation

## 2023-11-04 DIAGNOSIS — D696 Thrombocytopenia, unspecified: Secondary | ICD-10-CM

## 2023-11-04 DIAGNOSIS — N186 End stage renal disease: Secondary | ICD-10-CM | POA: Diagnosis not present

## 2023-11-04 DIAGNOSIS — Z79899 Other long term (current) drug therapy: Secondary | ICD-10-CM | POA: Diagnosis not present

## 2023-11-04 DIAGNOSIS — R110 Nausea: Secondary | ICD-10-CM | POA: Insufficient documentation

## 2023-11-04 DIAGNOSIS — D649 Anemia, unspecified: Secondary | ICD-10-CM | POA: Diagnosis not present

## 2023-11-04 DIAGNOSIS — E1122 Type 2 diabetes mellitus with diabetic chronic kidney disease: Secondary | ICD-10-CM | POA: Insufficient documentation

## 2023-11-04 LAB — CBC WITH DIFFERENTIAL (CANCER CENTER ONLY)
Abs Immature Granulocytes: 0.01 K/uL (ref 0.00–0.07)
Basophils Absolute: 0 K/uL (ref 0.0–0.1)
Basophils Relative: 1 %
Eosinophils Absolute: 0.1 K/uL (ref 0.0–0.5)
Eosinophils Relative: 2 %
HCT: 30.8 % — ABNORMAL LOW (ref 36.0–46.0)
Hemoglobin: 9.6 g/dL — ABNORMAL LOW (ref 12.0–15.0)
Immature Granulocytes: 0 %
Lymphocytes Relative: 18 %
Lymphs Abs: 0.4 K/uL — ABNORMAL LOW (ref 0.7–4.0)
MCH: 30.7 pg (ref 26.0–34.0)
MCHC: 31.2 g/dL (ref 30.0–36.0)
MCV: 98.4 fL (ref 80.0–100.0)
Monocytes Absolute: 0.1 K/uL (ref 0.1–1.0)
Monocytes Relative: 4 %
Neutro Abs: 1.8 K/uL (ref 1.7–7.7)
Neutrophils Relative %: 75 %
Platelet Count: 66 K/uL — ABNORMAL LOW (ref 150–400)
RBC: 3.13 MIL/uL — ABNORMAL LOW (ref 3.87–5.11)
RDW: 15.9 % — ABNORMAL HIGH (ref 11.5–15.5)
WBC Count: 2.4 K/uL — ABNORMAL LOW (ref 4.0–10.5)
nRBC: 0 % (ref 0.0–0.2)

## 2023-11-04 MED ORDER — DIPHENOXYLATE-ATROPINE 2.5-0.025 MG PO TABS: 1.0000 | ORAL_TABLET | Freq: Four times a day (QID) | ORAL | 0 refills | Status: DC | PRN

## 2023-11-04 MED ORDER — ONDANSETRON 8 MG PO TBDP: 8.0000 mg | ORAL_TABLET | Freq: Three times a day (TID) | ORAL | 0 refills | Status: DC | PRN

## 2023-11-04 NOTE — Progress Notes (Signed)
 [facility]  [phone] [phone]  ID: Tonya Myers OB: Apr 24, 1966  MR#: [id number]  RDW#:[id number]  Patient Care Team: Tonya Agent, MD as PCP - General (Family Medicine) Tonya Tonya PARAS, MD as Consulting Physician (Oncology)  CHIEF COMPLAINT: History of CLL, ITP.  INTERVAL HISTORY: Patient returns to clinic today for repeat laboratory work and further evaluation.  She complains of persistent nausea, vomiting, and diarrhea over the past several weeks to months.  She denies any fevers.  She continues to have a chronic peripheral neuropathy, but no other neurologic complaints.  She has a fair appetite, but denies weight loss.  She does not complain of any night sweats or other B symptoms.  She has no chest pain, shortness of breath, cough, or hemoptysis.  She has no urinary complaints.  Patient offers no further specific complaints today.  REVIEW OF SYSTEMS:   Review of Systems  Constitutional:  Positive for malaise/fatigue. Negative for fever and weight loss.  Respiratory: Negative.  Negative for cough, hemoptysis and shortness of breath.   Cardiovascular: Negative.  Negative for chest pain and leg swelling.  Gastrointestinal:  Positive for diarrhea, nausea and vomiting. Negative for abdominal pain.  Genitourinary: Negative.  Negative for dysuria.  Musculoskeletal: Negative.  Negative for back pain.  Skin: Negative.  Negative for itching and rash.  Neurological:  Positive for sensory change and weakness. Negative for dizziness, focal weakness and headaches.  Psychiatric/Behavioral:  The patient is nervous/anxious.     As per HPI. Otherwise, a complete review of systems is negative.  PAST MEDICAL HISTORY: Past Medical History:  Diagnosis Date   Acute hemorrhoid 03/11/2015   Anxiety    Arthritis    Asthma    Cancer (HCC)    Chronic back pain    CLL (chronic lymphocytic leukemia) (HCC)    Depression    Diabetes mellitus  without complication (HCC)    Genital herpes    type 2   Hypertension    Hypothyroidism    Vertigo     PAST SURGICAL HISTORY: Past Surgical History:  Procedure Laterality Date   ABDOMINAL HYSTERECTOMY     BILATERAL SALPINGOOPHORECTOMY  2009   BREAST BIOPSY Right 05/17/2016   FIBROADENOMATOUS CHANGE AND SCLEROSING ADENOSIS WITH   COLONOSCOPY WITH PROPOFOL  N/A 06/16/2015   Procedure: COLONOSCOPY WITH PROPOFOL ;  Surgeon: Tonya Vaughn Manes, MD;  Location: [facility] ENDOSCOPY;  Service: Endoscopy;  Laterality: N/A;   DIALYSIS/PERMA CATHETER INSERTION N/A 12/17/2020   Procedure: DIALYSIS/PERMA CATHETER INSERTION;  Surgeon: Tonya Selinda RAMAN, MD;  Location: [facility] INVASIVE CV LAB;  Service: Cardiovascular;  Laterality: N/A;   LAPAROSCOPIC SUPRACERVICAL HYSTERECTOMY  2009   due to leio   OOPHORECTOMY     TEMPORARY DIALYSIS CATHETER N/A 12/10/2020   Procedure: TEMPORARY DIALYSIS CATHETER;  Surgeon: Tonya Cordella MATSU, MD;  Location: [facility] INVASIVE CV LAB;  Service: Cardiovascular;  Laterality: N/A;   THORACIC LAMINECTOMY FOR EPIDURAL ABSCESS Bilateral 06/17/2020   Procedure: THORACIC LAMINECTOMY FOR EPIDURAL ABSCESS;  Surgeon: Tonya Standing, MD;  Location: [facility] ORS;  Service: Neurosurgery;  Laterality: Bilateral;    FAMILY HISTORY: Family History  Problem Relation Age of Onset   Cancer Paternal Aunt    Breast cancer Maternal Aunt 4    ADVANCED DIRECTIVES (Y/N):  N  HEALTH MAINTENANCE: Social History   Tobacco Use   Smoking status: Never   Smokeless tobacco: Never  Vaping Use   Vaping status: Never Used  Substance Use Topics   Alcohol use: Not Currently  Alcohol/week: 1.0 standard drink of alcohol    Types: 1 Cans of beer per week   Drug use: No     Colonoscopy:  PAP:  Bone density:  Lipid panel:  Allergies  Allergen Reactions   Amoxapine     Other Reaction(s): Unknown   Latex    Ceftazidime     Other reaction(s): Confusion, Hallucination, Unknown Encephalopathy - improved  after dialysis/some concern for cephalosporin neurotoxicity Tolerated without confusion 06/2021. Encephalopathy - improved after dialysis/some concern for cephalosporin neurotoxicity Tolerated without confusion 06/2021.    Iodinated Contrast Media Hives    Other Reaction(s): Hives   Penicillins     Other Reaction(s): Unknown    Current Outpatient Medications  Medication Sig Dispense Refill   acetaminophen  (TYLENOL ) 325 MG tablet Take 2 tablets (650 mg total) by mouth every 6 (six) hours as needed for mild pain (or Fever >/= 101).     albuterol  (VENTOLIN  HFA) 108 (90 Base) MCG/ACT inhaler Inhale 2 puffs into the lungs every 6 (six) hours as needed for wheezing or shortness of breath.     ascorbic acid  (VITAMIN C ) 500 MG tablet Take 1 tablet (500 mg total) by mouth 2 (two) times daily.     atorvastatin  (LIPITOR) 20 MG tablet Take 20 mg by mouth daily.     Blood Glucose Monitoring Suppl DEVI 1 each by Does not apply route 3 (three) times daily. May dispense any manufacturer covered by patient's insurance. 1 each 0   calcium  acetate (PHOSLO ) 667 MG capsule Take 1,334 mg by mouth 3 (three) times daily with meals.     cetirizine (ZYRTEC) 10 MG tablet Take 1 tablet by mouth daily.     Continuous Blood Gluc Sensor (DEXCOM G6 SENSOR) MISC SMARTSIG:1 Each Topical Every 10 Days     fluticasone  (FLONASE ) 50 MCG/ACT nasal spray Place 2 sprays into both nostrils daily.     folic acid  (FOLVITE ) 1 MG tablet Take 1 tablet by mouth daily.     gabapentin  (NEURONTIN ) 300 MG capsule TAKE 1 CAPSULE BY MOUTH THREE TIMES A DAY 90 capsule 3   Glucose Blood (BLOOD GLUCOSE TEST STRIPS) STRP 1 each by Does not apply route 3 (three) times daily. Use as directed to check blood sugar. May dispense any manufacturer covered by patient's insurance and fits patient's device. 100 strip 0   hydrOXYzine  (ATARAX ) 25 MG tablet Take 1 tablet (25 mg total) by mouth 3 (three) times daily as needed for itching. 30 tablet 0   insulin   aspart (NOVOLOG ) 100 UNIT/ML FlexPen Inject 0-11 Units into the skin 3 (three) times daily with meals. Check Blood Glucose (BG) and inject per scale: BG <150= 0 unit; BG 150-200= 1 unit; BG 201-250= 3 unit; BG 251-300= 5 unit; BG 301-350= 7 unit; BG 351-400= 9 unit; BG >400= 11 unit and Call Primary Care. 15 mL 0   insulin  glargine-yfgn (SEMGLEE ) 100 UNIT/ML Pen Inject 10 Units into the skin 2 (two) times daily. May substitute as needed per insurance. 15 mL 2   Insulin  Pen Needle (PEN NEEDLES) 31G X 5 MM MISC 1 each by Does not apply route 3 (three) times daily. May dispense any manufacturer covered by patient's insurance. 100 each 0   Lancet Device MISC 1 each by Does not apply route 3 (three) times daily. May dispense any manufacturer covered by patient's insurance. 1 each 0   Lancets MISC 1 each by Does not apply route 3 (three) times daily. Use as directed to check blood  sugar. May dispense any manufacturer covered by patient's insurance and fits patient's device. 100 each 0   levothyroxine  (SYNTHROID ) 125 MCG tablet Take 125 mcg by mouth daily.     LOKELMA  10 g PACK packet Take 10 g by mouth once a week.     multivitamin (RENA-VIT) TABS tablet Take 1 tablet by mouth at bedtime.  0   ondansetron  (ZOFRAN -ODT) 4 MG disintegrating tablet Take 1 tablet (4 mg total) by mouth every 8 (eight) hours as needed for nausea or vomiting. 20 tablet 1   pantoprazole  (PROTONIX ) 40 MG tablet Take 1 tablet (40 mg total) by mouth 2 (two) times daily for 28 days, THEN 1 tablet (40 mg total) daily. 30 tablet 1   traMADol  (ULTRAM ) 50 MG tablet Take 1 tablet (50 mg total) by mouth every 8 (eight) hours as needed. 90 tablet 0   zinc  sulfate 220 (50 Zn) MG capsule Take 1 capsule (220 mg total) by mouth daily.     [Paused] aspirin  EC 81 MG tablet Take 1 tablet (81 mg total) by mouth daily. Swallow whole. (Patient not taking: Reported on 11/04/2023) 30 tablet 11   [Paused] furosemide  (LASIX ) 80 MG tablet Take 1 tablet by mouth  daily. (Patient not taking: Reported on 11/04/2023)     [Paused] telmisartan  (MICARDIS ) 80 MG tablet Take 80 mg by mouth daily. (Patient not taking: Reported on 11/04/2023)     No current facility-administered medications for this visit.    OBJECTIVE: Vitals:   11/04/23 1040  BP: 115/72  Pulse: 84  Resp: 16  Temp: 98.2 F (36.8 C)  SpO2: 100%      Body mass index is 27.53 kg/m.    ECOG FS:1 - Symptomatic but completely ambulatory  General: Well-developed, well-nourished, no acute distress.  Sitting in a wheelchair. Eyes: Pink conjunctiva, anicteric sclera. HEENT: Normocephalic, moist mucous membranes. Lungs: No audible wheezing or coughing. Heart: Regular rate and rhythm. Abdomen: Soft, nontender, no obvious distention. Musculoskeletal: No edema, cyanosis, or clubbing. Neuro: Alert, answering all questions appropriately. Cranial nerves grossly intact. Skin: No rashes or petechiae noted. Psych: Normal affect.   LAB RESULTS:  Lab Results  Component Value Date   NA 129 (L) 08/19/2023   K 5.0 08/19/2023   CL 95 (L) 08/19/2023   CO2 22 08/19/2023   GLUCOSE 119 (H) 08/19/2023   BUN 101 (H) 08/19/2023   CREATININE 7.26 (H) 08/19/2023   CALCIUM  8.3 (L) 08/19/2023   PROT 7.9 07/29/2023   ALBUMIN 3.2 (L) 08/19/2023   AST 36 07/29/2023   ALT 21 07/29/2023   ALKPHOS 63 07/29/2023   BILITOT 1.4 (H) 07/29/2023   GFRNONAA 6 (L) 08/19/2023   GFRAA >60 01/17/2020    Lab Results  Component Value Date   WBC 2.4 (L) 11/04/2023   NEUTROABS 1.8 11/04/2023   HGB 9.6 (L) 11/04/2023   HCT 30.8 (L) 11/04/2023   MCV 98.4 11/04/2023   PLT 66 (L) 11/04/2023     STUDIES: No results found.   ASSESSMENT: History of CLL, ITP  PLAN:    CLL: In complete remission.  Bone marrow biopsy on August 03, 2023 did not reveal any monoclonal B-cell population and no other significant pathology.  Cytogenetics was reported as normal.  Her total white count has trended down slightly likely  secondary to recent viral illness.  Peripheral blood flow cytometry is negative.  CT scan of the abdomen and pelvis on July 25, 2023 did not reveal any lymphadenopathy.  She last received treatment  with Rituxan  and Treanda  on April 09, 2015.  No intervention is needed at this time. Anemia: Hemoglobin is improved to 9.6.  Recent iron stores are within normal limits.  Patient may benefit from Retacrit  in the future. Elevated ferritin: Likely secondary to acute phase reactant from dialysis.   ITP:  Patient's platelet count was within normal limits between August 2023 and January 2024.  Between October and December 2024 patient's platelet count declined slightly to 120s to 130s.  More recently, her platelet count trended down to 10, but on extended prednisone  taper has increased to 80.  Platelets have now trended back down to 66.  Bone marrow biopsy results negative as above.  All of her laboratory work including SPEP are either no or within normal limits.  Can consider weekly Rituxan  x 4 in the near future.  Return to clinic in 3 weeks for laboratory work and further evaluation. End-stage renal disease: Patient reports she receives dialysis on Monday, Wednesdays, and Fridays.  Patient should be eligible for kidney transplant from a hematology standpoint. Nausea: Appears viral in nature.  Patient was given a prescription for ondansetron  ODT. Diarrhea: Appears viral in nature.  Patient was given a prescription for Lomotil .   Patient expressed understanding and was in agreement with this plan. She also understands that She can call clinic at any time with any questions, concerns, or complaints.    Tonya JINNY Reusing, MD   11/04/2023 10:50 AM

## 2023-11-06 ENCOUNTER — Emergency Department
Admission: EM | Admit: 2023-11-06 | Discharge: 2023-11-06 | Disposition: A | Discharge status: Home / Self Care | Attending: Emergency Medicine | Admitting: Emergency Medicine

## 2023-11-06 DIAGNOSIS — E1122 Type 2 diabetes mellitus with diabetic chronic kidney disease: Secondary | ICD-10-CM | POA: Diagnosis not present

## 2023-11-06 DIAGNOSIS — Z992 Dependence on renal dialysis: Secondary | ICD-10-CM | POA: Insufficient documentation

## 2023-11-06 DIAGNOSIS — D72819 Decreased white blood cell count, unspecified: Secondary | ICD-10-CM | POA: Insufficient documentation

## 2023-11-06 DIAGNOSIS — K529 Noninfective gastroenteritis and colitis, unspecified: Secondary | ICD-10-CM | POA: Diagnosis not present

## 2023-11-06 DIAGNOSIS — E039 Hypothyroidism, unspecified: Secondary | ICD-10-CM | POA: Diagnosis not present

## 2023-11-06 DIAGNOSIS — N186 End stage renal disease: Secondary | ICD-10-CM | POA: Diagnosis not present

## 2023-11-06 DIAGNOSIS — R112 Nausea with vomiting, unspecified: Secondary | ICD-10-CM | POA: Diagnosis present

## 2023-11-06 LAB — COMPREHENSIVE METABOLIC PANEL WITH GFR
ALT: 21 U/L (ref 0–44)
AST: 34 U/L (ref 15–41)
Albumin: 3.8 g/dL (ref 3.5–5.0)
Alkaline Phosphatase: 72 U/L (ref 38–126)
Anion gap: 12 (ref 5–15)
BUN: 39 mg/dL — ABNORMAL HIGH (ref 6–20)
CO2: 28 mmol/L (ref 22–32)
Calcium: 8.7 mg/dL — ABNORMAL LOW (ref 8.9–10.3)
Chloride: 98 mmol/L (ref 98–111)
Creatinine, Ser: 6.59 mg/dL — ABNORMAL HIGH (ref 0.44–1.00)
GFR, Estimated: 7 mL/min — ABNORMAL LOW (ref >60)
Glucose, Bld: 103 mg/dL — ABNORMAL HIGH (ref 70–99)
Potassium: 4.5 mmol/L (ref 3.5–5.1)
Sodium: 138 mmol/L (ref 135–145)
Total Bilirubin: 0.6 mg/dL (ref 0.0–1.2)
Total Protein: 8.1 g/dL (ref 6.5–8.1)

## 2023-11-06 LAB — LIPASE, BLOOD: Lipase: 88 U/L — ABNORMAL HIGH (ref 11–51)

## 2023-11-06 LAB — CBC
HCT: 31.5 % — ABNORMAL LOW (ref 36.0–46.0)
Hemoglobin: 9.8 g/dL — ABNORMAL LOW (ref 12.0–15.0)
MCH: 30.8 pg (ref 26.0–34.0)
MCHC: 31.1 g/dL (ref 30.0–36.0)
MCV: 99.1 fL (ref 80.0–100.0)
Platelets: 80 K/uL — ABNORMAL LOW (ref 150–400)
RBC: 3.18 MIL/uL — ABNORMAL LOW (ref 3.87–5.11)
RDW: 15.9 % — ABNORMAL HIGH (ref 11.5–15.5)
WBC: 3.5 K/uL — ABNORMAL LOW (ref 4.0–10.5)
nRBC: 0 % (ref 0.0–0.2)

## 2023-11-06 LAB — MAGNESIUM: Magnesium: 2.2 mg/dL (ref 1.7–2.4)

## 2023-11-06 MED ORDER — DIPHENOXYLATE-ATROPINE 2.5-0.025 MG PO TABS
1.0000 | ORAL_TABLET | Freq: Once | ORAL | Status: AC
  Administered 2023-11-06: 1 via ORAL
  Filled 2023-11-06: qty 1

## 2023-11-06 MED ORDER — PROMETHAZINE HCL 12.5 MG PO TABS: 12.5000 mg | ORAL_TABLET | Freq: Four times a day (QID) | ORAL | 0 refills | Status: DC | PRN

## 2023-11-06 MED ORDER — DIPHENOXYLATE-ATROPINE 2.5-0.025 MG PO TABS: 1.0000 | ORAL_TABLET | Freq: Four times a day (QID) | ORAL | 0 refills | Status: AC | PRN

## 2023-11-06 MED ORDER — SODIUM CHLORIDE 0.9 % IV SOLN
12.5000 mg | Freq: Once | INTRAVENOUS | Status: AC
  Administered 2023-11-06: 12.5 mg via INTRAVENOUS
  Filled 2023-11-06: qty 0.5

## 2023-11-06 NOTE — ED Notes (Signed)
 Legal Guardian, Reena Hedge, notified of pt's visit and discharge.

## 2023-11-06 NOTE — ED Triage Notes (Addendum)
 Patient C/O diarrhea that began a month ago, and vomiting that began three days ago. Patient has seen her primary care Dr. For these concerns, but she says the medications she was given are not working. She also states that her electrolytes are worse because of the vomiting. She is an oncology patient, but is not currently receiving chemo, and states that she is cancer free now. Previously had leukemia. Dialysis patient-Mon, Wed, and Friday. Patient still makes urine. Denies abdominal pain.

## 2023-11-06 NOTE — ED Notes (Signed)
 Pt prior visits required a US  stick. Called lab to come draw the pts blood work.

## 2023-11-06 NOTE — ED Provider Notes (Signed)
 [facility] Provider Note    Event Date/Time   First MD Initiated Contact with Patient 11/06/23 0138     (approximate)   History   Chief Complaint Diarrhea and Emesis   HPI  Tonya Myers is a 58 y.o. female with past medical history of diabetes, ESRD on HD (MWF), CLL, hypothyroidism, and orthostatic hypotension who presents to the ED complaining of vomiting and diarrhea.  Patient reports that she has had 1 month of intermittent diarrhea, now with nausea and persistent vomiting over the past 3 days.  She reports intermittent crampy pain in her abdomen but does not have any abdominal pain currently.  She denies any fevers, still makes urine but denies any dysuria or flank pain.  She has been prescribed Zofran  and Imodium  by her PCP, but has not had any relief with these medications.  She completed all of her scheduled dialysis sessions last week.     Physical Exam   Triage Vital Signs: ED Triage Vitals  Encounter Vitals Group     BP 11/06/23 0018 (!) 169/86     Girls Systolic BP Percentile --      Girls Diastolic BP Percentile --      Boys Systolic BP Percentile --      Boys Diastolic BP Percentile --      Pulse Rate 11/06/23 0018 67     Resp 11/06/23 0018 18     Temp 11/06/23 0018 98.4 F (36.9 C)     Temp Source 11/06/23 0018 Oral     SpO2 11/06/23 0018 100 %     Weight 11/06/23 0019 204 lb (92.5 kg)     Height 11/06/23 0019 6' (1.829 m)     Head Circumference --      Peak Flow --      Pain Score 11/06/23 0018 0     Pain Loc --      Pain Education --      Exclude from Growth Chart --     Most recent vital signs: Vitals:   11/06/23 0221 11/06/23 0436  BP:  134/63  Pulse:  70  Resp:  16  Temp:  97.7 F (36.5 C)  SpO2: 100% 100%    Constitutional: Alert and oriented. Eyes: Conjunctivae are normal. Head: Atraumatic. Nose: No congestion/rhinnorhea. Mouth/Throat: Mucous membranes are moist.  Cardiovascular: Normal rate,  regular rhythm. Grossly normal heart sounds.  2+ radial pulses bilaterally.  Right upper extremity AV fistula with palpable thrill. Respiratory: Normal respiratory effort.  No retractions. Lungs CTAB. Gastrointestinal: Soft and nontender. No distention. Musculoskeletal: No lower extremity tenderness nor edema.  Neurologic:  Normal speech and language. No gross focal neurologic deficits are appreciated.    ED Results / Procedures / Treatments   Labs (all labs ordered are listed, but only abnormal results are displayed) Labs Reviewed  LIPASE, BLOOD - Abnormal; Notable for the following components:      Result Value   Lipase 88 (*)    All other components within normal limits  COMPREHENSIVE METABOLIC PANEL WITH GFR - Abnormal; Notable for the following components:   Glucose, Bld 103 (*)    BUN 39 (*)    Creatinine, Ser 6.59 (*)    Calcium  8.7 (*)    GFR, Estimated 7 (*)    All other components within normal limits  CBC - Abnormal; Notable for the following components:   WBC 3.5 (*)    RBC 3.18 (*)    Hemoglobin 9.8 (*)  HCT 31.5 (*)    RDW 15.9 (*)    Platelets 80 (*)    All other components within normal limits  GASTROINTESTINAL PANEL BY PCR, STOOL (REPLACES STOOL CULTURE)  C DIFFICILE QUICK SCREEN W PCR REFLEX    MAGNESIUM   URINALYSIS, ROUTINE W REFLEX MICROSCOPIC     EKG  ED ECG REPORT I, Carlin Palin, the attending physician, personally viewed and interpreted this ECG.   Date: 11/06/2023  EKG Time: 00:31  Rate: 64  Rhythm: normal sinus rhythm  Axis: Normal  Intervals:none  ST&T Change: None  PROCEDURES:  Critical Care performed: No  Procedures   MEDICATIONS ORDERED IN ED: Medications  promethazine  (PHENERGAN ) 12.5 mg in sodium chloride  0.9 % 50 mL IVPB (0 mg Intravenous Stopped 11/06/23 0436)  diphenoxylate -atropine  (LOMOTIL ) 2.5-0.025 MG per tablet 1 tablet (1 tablet Oral Given 11/06/23 0437)     IMPRESSION / MDM / ASSESSMENT AND PLAN / ED COURSE   I reviewed the triage vital signs and the nursing notes.                              58 y.o. female with past medical history of diabetes, hypothyroidism, orthostatic hypotension, ESRD on HD, and CLL who presents to the ED complaining of 1 month of diarrhea now with 3 days of persistent nausea and vomiting.  Patient's presentation is most consistent with acute presentation with potential threat to life or bodily function.  Differential diagnosis includes, but is not limited to, gastroenteritis, dehydration, electrolyte abnormality, hepatitis, pancreatitis, gastritis, viral syndrome.  Patient nontoxic-appearing and in no acute distress, vital signs are unremarkable.  She has a benign abdominal exam and symptoms seem most consistent with a gastroenteritis.  She reports being unable to tolerate oral intake despite Zofran , will give dose of IV Phenergan  as well as Lomotil  for diarrhea.  No recent antibiotic courses, however will attempt to obtain stool sample to test for C. difficile and GI panel.  Labs thus far consistent with known ESRD, no significant electrolyte abnormality or LFT abnormality noted.  Additional labs with stable chronic thrombocytopenia and anemia, no significant leukocytosis noted.  Mild elevation in lipase noted but low suspicion for pancreatitis at this time.  On reassessment, patient reports feeling better and is tolerating oral intake without difficulty.  She is appropriate for discharge home with outpatient follow-up, will prescribe Phenergan  and Lomotil  for symptomatic management.  She was counseled to return to the ED for new or worsening symptoms, patient agrees with plan.      FINAL CLINICAL IMPRESSION(S) / ED DIAGNOSES   Final diagnoses:  Gastroenteritis  Nausea vomiting and diarrhea     Rx / DC Orders   ED Discharge Orders          Ordered    promethazine  (PHENERGAN ) 12.5 MG tablet  Every 6 hours PRN        11/06/23 0528    diphenoxylate -atropine   (LOMOTIL ) 2.5-0.025 MG tablet  4 times daily PRN        11/06/23 0528             Note:  This document was prepared using Dragon voice recognition software and may include unintentional dictation errors.   Palin Carlin, MD 11/06/23 0530

## 2023-11-24 ENCOUNTER — Telehealth: Payer: Self-pay | Admitting: Oncology

## 2023-11-24 ENCOUNTER — Inpatient Hospital Stay: Admitting: Oncology

## 2023-11-24 ENCOUNTER — Inpatient Hospital Stay

## 2023-11-24 NOTE — Telephone Encounter (Signed)
 Called pt to reschedule missed appts from today. Pt states that her dialysis ran over for today. Rescheduled her appts for tomorrow. PT confirmed appts. - LH

## 2023-11-25 ENCOUNTER — Ambulatory Visit: Admitting: Oncology

## 2023-11-25 ENCOUNTER — Encounter: Payer: Self-pay | Admitting: Oncology

## 2023-11-25 ENCOUNTER — Other Ambulatory Visit: Attending: Oncology

## 2023-11-25 VITALS — BP 120/79 | HR 87 | Temp 97.9°F | Resp 17 | Wt 202.0 lb

## 2023-11-25 DIAGNOSIS — D649 Anemia, unspecified: Secondary | ICD-10-CM | POA: Insufficient documentation

## 2023-11-25 DIAGNOSIS — R197 Diarrhea, unspecified: Secondary | ICD-10-CM | POA: Diagnosis not present

## 2023-11-25 DIAGNOSIS — Z794 Long term (current) use of insulin: Secondary | ICD-10-CM | POA: Diagnosis not present

## 2023-11-25 DIAGNOSIS — R531 Weakness: Secondary | ICD-10-CM | POA: Insufficient documentation

## 2023-11-25 DIAGNOSIS — Z7982 Long term (current) use of aspirin: Secondary | ICD-10-CM | POA: Insufficient documentation

## 2023-11-25 DIAGNOSIS — D693 Immune thrombocytopenic purpura: Secondary | ICD-10-CM | POA: Diagnosis not present

## 2023-11-25 DIAGNOSIS — C9110 Chronic lymphocytic leukemia of B-cell type not having achieved remission: Secondary | ICD-10-CM | POA: Insufficient documentation

## 2023-11-25 DIAGNOSIS — N186 End stage renal disease: Secondary | ICD-10-CM | POA: Diagnosis not present

## 2023-11-25 DIAGNOSIS — Z992 Dependence on renal dialysis: Secondary | ICD-10-CM | POA: Diagnosis not present

## 2023-11-25 DIAGNOSIS — Z79899 Other long term (current) drug therapy: Secondary | ICD-10-CM | POA: Insufficient documentation

## 2023-11-25 DIAGNOSIS — D696 Thrombocytopenia, unspecified: Secondary | ICD-10-CM

## 2023-11-25 DIAGNOSIS — R5383 Other fatigue: Secondary | ICD-10-CM | POA: Insufficient documentation

## 2023-11-25 DIAGNOSIS — E1142 Type 2 diabetes mellitus with diabetic polyneuropathy: Secondary | ICD-10-CM | POA: Insufficient documentation

## 2023-11-25 DIAGNOSIS — E039 Hypothyroidism, unspecified: Secondary | ICD-10-CM | POA: Diagnosis not present

## 2023-11-25 DIAGNOSIS — J45909 Unspecified asthma, uncomplicated: Secondary | ICD-10-CM | POA: Insufficient documentation

## 2023-11-25 DIAGNOSIS — I120 Hypertensive chronic kidney disease with stage 5 chronic kidney disease or end stage renal disease: Secondary | ICD-10-CM | POA: Insufficient documentation

## 2023-11-25 DIAGNOSIS — Z803 Family history of malignant neoplasm of breast: Secondary | ICD-10-CM | POA: Diagnosis not present

## 2023-11-25 DIAGNOSIS — D72819 Decreased white blood cell count, unspecified: Secondary | ICD-10-CM | POA: Insufficient documentation

## 2023-11-25 DIAGNOSIS — R7989 Other specified abnormal findings of blood chemistry: Secondary | ICD-10-CM | POA: Diagnosis not present

## 2023-11-25 LAB — CMP (CANCER CENTER ONLY)
ALT: 7 U/L (ref 0–44)
AST: 27 U/L (ref 15–41)
Albumin: 4.3 g/dL (ref 3.5–5.0)
Alkaline Phosphatase: 89 U/L (ref 38–126)
Anion gap: 13 (ref 5–15)
BUN: 16 mg/dL (ref 6–20)
CO2: 28 mmol/L (ref 22–32)
Calcium: 10.1 mg/dL (ref 8.9–10.3)
Chloride: 95 mmol/L — ABNORMAL LOW (ref 98–111)
Creatinine: 3.14 mg/dL — ABNORMAL HIGH (ref 0.44–1.00)
GFR, Estimated: 16 mL/min — ABNORMAL LOW (ref >60)
Glucose, Bld: 93 mg/dL (ref 70–99)
Potassium: 3.4 mmol/L — ABNORMAL LOW (ref 3.5–5.1)
Sodium: 136 mmol/L (ref 135–145)
Total Bilirubin: 0.6 mg/dL (ref 0.0–1.2)
Total Protein: 8.5 g/dL — ABNORMAL HIGH (ref 6.5–8.1)

## 2023-11-25 LAB — CBC WITH DIFFERENTIAL/PLATELET
Abs Immature Granulocytes: 0.02 K/uL (ref 0.00–0.07)
Basophils Absolute: 0 K/uL (ref 0.0–0.1)
Basophils Relative: 1 %
Eosinophils Absolute: 0.1 K/uL (ref 0.0–0.5)
Eosinophils Relative: 2 %
HCT: 35.2 % — ABNORMAL LOW (ref 36.0–46.0)
Hemoglobin: 11.1 g/dL — ABNORMAL LOW (ref 12.0–15.0)
Immature Granulocytes: 1 %
Lymphocytes Relative: 14 %
Lymphs Abs: 0.5 K/uL — ABNORMAL LOW (ref 0.7–4.0)
MCH: 31 pg (ref 26.0–34.0)
MCHC: 31.5 g/dL (ref 30.0–36.0)
MCV: 98.3 fL (ref 80.0–100.0)
Monocytes Absolute: 0.2 K/uL (ref 0.1–1.0)
Monocytes Relative: 6 %
Neutro Abs: 2.8 K/uL (ref 1.7–7.7)
Neutrophils Relative %: 76 %
Platelets: 123 K/uL — ABNORMAL LOW (ref 150–400)
RBC: 3.58 MIL/uL — ABNORMAL LOW (ref 3.87–5.11)
RDW: 17.5 % — ABNORMAL HIGH (ref 11.5–15.5)
WBC: 3.7 K/uL — ABNORMAL LOW (ref 4.0–10.5)
nRBC: 0 % (ref 0.0–0.2)

## 2023-11-25 NOTE — Progress Notes (Signed)
 Patient here for oncology follow-up appointment, concerns of dizziness and recent falls

## 2023-11-25 NOTE — Progress Notes (Signed)
 [facility]  [phone] [phone]  ID: Tonya Myers OB: [birth year]  MR#: [id number]  RDW#:[id number]  Patient Care Team: Valora Agent, MD as PCP - General (Family Medicine) Jacobo Evalene PARAS, MD as Consulting Physician (Oncology)  CHIEF COMPLAINT: History of CLL, ITP.  INTERVAL HISTORY: Patient returns to clinic today for repeat laboratory and further evaluation.  He continues to have multiple medical complaints including chronic weakness and fatigue.  She continues to have mild diarrhea, but her nausea and vomiting have resolved.  She denies any fevers.  She continues to have a chronic peripheral neuropathy, but no other neurologic complaints.  She has a fair appetite, but denies weight loss.  She does not complain of any night sweats or other B symptoms.  She has no chest pain, shortness of breath, cough, or hemoptysis.  She has no urinary complaints.  Patient offers no further specific complaints today.  REVIEW OF SYSTEMS:   Review of Systems  Constitutional:  Positive for malaise/fatigue. Negative for fever and weight loss.  Respiratory: Negative.  Negative for cough, hemoptysis and shortness of breath.   Cardiovascular: Negative.  Negative for chest pain and leg swelling.  Gastrointestinal:  Positive for diarrhea. Negative for abdominal pain.  Genitourinary: Negative.  Negative for dysuria.  Musculoskeletal: Negative.  Negative for back pain.  Skin: Negative.  Negative for itching and rash.  Neurological:  Positive for sensory change and weakness. Negative for dizziness, focal weakness and headaches.  Psychiatric/Behavioral: Negative.  The patient is not nervous/anxious.     As per HPI. Otherwise, a complete review of systems is negative.  PAST MEDICAL HISTORY: Past Medical History:  Diagnosis Date   Acute hemorrhoid 03/11/2015   Anxiety    Arthritis    Asthma    Cancer (HCC)    Chronic back pain    CLL (chronic  lymphocytic leukemia) (HCC)    Depression    Diabetes mellitus without complication (HCC)    Genital herpes    type 2   Hypertension    Hypothyroidism    Vertigo     PAST SURGICAL HISTORY: Past Surgical History:  Procedure Laterality Date   ABDOMINAL HYSTERECTOMY     BILATERAL SALPINGOOPHORECTOMY  2009   BREAST BIOPSY Right 05/17/2016   FIBROADENOMATOUS CHANGE AND SCLEROSING ADENOSIS WITH   COLONOSCOPY WITH PROPOFOL  N/A 06/16/2015   Procedure: COLONOSCOPY WITH PROPOFOL ;  Surgeon: Donnice Vaughn Manes, MD;  Location: [facility] ENDOSCOPY;  Service: Endoscopy;  Laterality: N/A;   DIALYSIS/PERMA CATHETER INSERTION N/A 12/17/2020   Procedure: DIALYSIS/PERMA CATHETER INSERTION;  Surgeon: Marea Selinda RAMAN, MD;  Location: [facility] INVASIVE CV LAB;  Service: Cardiovascular;  Laterality: N/A;   LAPAROSCOPIC SUPRACERVICAL HYSTERECTOMY  2009   due to leio   OOPHORECTOMY     TEMPORARY DIALYSIS CATHETER N/A 12/10/2020   Procedure: TEMPORARY DIALYSIS CATHETER;  Surgeon: Jama Cordella MATSU, MD;  Location: [facility] INVASIVE CV LAB;  Service: Cardiovascular;  Laterality: N/A;   THORACIC LAMINECTOMY FOR EPIDURAL ABSCESS Bilateral 06/17/2020   Procedure: THORACIC LAMINECTOMY FOR EPIDURAL ABSCESS;  Surgeon: Bluford Standing, MD;  Location: [facility] ORS;  Service: Neurosurgery;  Laterality: Bilateral;    FAMILY HISTORY: Family History  Problem Relation Age of Onset   Cancer Paternal Aunt    Breast cancer Maternal Aunt 56    ADVANCED DIRECTIVES (Y/N):  N  HEALTH MAINTENANCE: Social History   Tobacco Use   Smoking status: Never   Smokeless tobacco: Never  Vaping Use   Vaping status: Never Used  Substance  Use Topics   Alcohol use: Not Currently    Alcohol/week: 1.0 standard drink of alcohol    Types: 1 Cans of beer per week   Drug use: No     Colonoscopy:  PAP:  Bone density:  Lipid panel:  Allergies  Allergen Reactions   Amoxapine     Other Reaction(s): Unknown   Latex    Ceftazidime     Other  reaction(s): Confusion, Hallucination, Unknown Encephalopathy - improved after dialysis/some concern for cephalosporin neurotoxicity Tolerated without confusion 06/2021. Encephalopathy - improved after dialysis/some concern for cephalosporin neurotoxicity Tolerated without confusion 06/2021.    Iodinated Contrast Media Hives    Other Reaction(s): Hives   Penicillins     Other Reaction(s): Unknown    Current Outpatient Medications  Medication Sig Dispense Refill   acetaminophen  (TYLENOL ) 325 MG tablet Take 2 tablets (650 mg total) by mouth every 6 (six) hours as needed for mild pain (or Fever >/= 101).     albuterol  (VENTOLIN  HFA) 108 (90 Base) MCG/ACT inhaler Inhale 2 puffs into the lungs every 6 (six) hours as needed for wheezing or shortness of breath.     ascorbic acid  (VITAMIN C ) 500 MG tablet Take 1 tablet (500 mg total) by mouth 2 (two) times daily.     [Paused] aspirin  EC 81 MG tablet Take 1 tablet (81 mg total) by mouth daily. Swallow whole. 30 tablet 11   atorvastatin  (LIPITOR) 20 MG tablet Take 20 mg by mouth daily.     Blood Glucose Monitoring Suppl DEVI 1 each by Does not apply route 3 (three) times daily. May dispense any manufacturer covered by patient's insurance. 1 each 0   calcium  acetate (PHOSLO ) 667 MG capsule Take 1,334 mg by mouth 3 (three) times daily with meals.     cetirizine (ZYRTEC) 10 MG tablet Take 1 tablet by mouth daily.     Continuous Blood Gluc Sensor (DEXCOM G6 SENSOR) MISC SMARTSIG:1 Each Topical Every 10 Days     fluticasone  (FLONASE ) 50 MCG/ACT nasal spray Place 2 sprays into both nostrils daily.     folic acid  (FOLVITE ) 1 MG tablet Take 1 tablet by mouth daily.     gabapentin  (NEURONTIN ) 300 MG capsule TAKE 1 CAPSULE BY MOUTH THREE TIMES A DAY 90 capsule 3   Glucose Blood (BLOOD GLUCOSE TEST STRIPS) STRP 1 each by Does not apply route 3 (three) times daily. Use as directed to check blood sugar. May dispense any manufacturer covered by patient's insurance  and fits patient's device. 100 strip 0   hydrOXYzine  (ATARAX ) 25 MG tablet Take 1 tablet (25 mg total) by mouth 3 (three) times daily as needed for itching. 30 tablet 0   insulin  aspart (NOVOLOG ) 100 UNIT/ML FlexPen Inject 0-11 Units into the skin 3 (three) times daily with meals. Check Blood Glucose (BG) and inject per scale: BG <150= 0 unit; BG 150-200= 1 unit; BG 201-250= 3 unit; BG 251-300= 5 unit; BG 301-350= 7 unit; BG 351-400= 9 unit; BG >400= 11 unit and Call Primary Care. 15 mL 0   insulin  glargine-yfgn (SEMGLEE ) 100 UNIT/ML Pen Inject 10 Units into the skin 2 (two) times daily. May substitute as needed per insurance. 15 mL 2   Insulin  Pen Needle (PEN NEEDLES) 31G X 5 MM MISC 1 each by Does not apply route 3 (three) times daily. May dispense any manufacturer covered by patient's insurance. 100 each 0   Lancet Device MISC 1 each by Does not apply route 3 (three) times  daily. May dispense any manufacturer covered by patient's insurance. 1 each 0   Lancets MISC 1 each by Does not apply route 3 (three) times daily. Use as directed to check blood sugar. May dispense any manufacturer covered by patient's insurance and fits patient's device. 100 each 0   levothyroxine  (SYNTHROID ) 125 MCG tablet Take 125 mcg by mouth daily.     LOKELMA  10 g PACK packet Take 10 g by mouth once a week.     multivitamin (RENA-VIT) TABS tablet Take 1 tablet by mouth at bedtime.  0   ondansetron  (ZOFRAN -ODT) 8 MG disintegrating tablet Take 1 tablet (8 mg total) by mouth every 8 (eight) hours as needed for nausea or vomiting. 30 tablet 0   pantoprazole  (PROTONIX ) 40 MG tablet Take 1 tablet (40 mg total) by mouth 2 (two) times daily for 28 days, THEN 1 tablet (40 mg total) daily. 30 tablet 1   promethazine  (PHENERGAN ) 12.5 MG tablet Take 1 tablet (12.5 mg total) by mouth every 6 (six) hours as needed. 12 tablet 0   traMADol  (ULTRAM ) 50 MG tablet Take 1 tablet (50 mg total) by mouth every 8 (eight) hours as needed. 90 tablet 0    zinc  sulfate 220 (50 Zn) MG capsule Take 1 capsule (220 mg total) by mouth daily.     [Paused] furosemide  (LASIX ) 80 MG tablet Take 1 tablet by mouth daily. (Patient not taking: Reported on 11/25/2023)     [Paused] telmisartan  (MICARDIS ) 80 MG tablet Take 80 mg by mouth daily. (Patient not taking: Reported on 11/25/2023)     No current facility-administered medications for this visit.    OBJECTIVE: Vitals:   11/25/23 1020  BP: 120/79  Pulse: 87  Resp: 17  Temp: 97.9 F (36.6 C)  SpO2: 100%      Body mass index is 27.4 kg/m.    ECOG FS:1 - Symptomatic but completely ambulatory  General: Well-developed, well-nourished, no acute distress. Eyes: Pink conjunctiva, anicteric sclera. HEENT: Normocephalic, moist mucous membranes. Lungs: No audible wheezing or coughing. Heart: Regular rate and rhythm. Abdomen: Soft, nontender, no obvious distention. Musculoskeletal: No edema, cyanosis, or clubbing. Neuro: Alert, answering all questions appropriately. Cranial nerves grossly intact. Skin: No rashes or petechiae noted. Psych: Normal affect.  LAB RESULTS:  Lab Results  Component Value Date   NA 136 11/25/2023   K 3.4 (L) 11/25/2023   CL 95 (L) 11/25/2023   CO2 28 11/25/2023   GLUCOSE 93 11/25/2023   BUN 16 11/25/2023   CREATININE 3.14 (H) 11/25/2023   CALCIUM  10.1 11/25/2023   PROT 8.5 (H) 11/25/2023   ALBUMIN 4.3 11/25/2023   AST 27 11/25/2023   ALT 7 11/25/2023   ALKPHOS 89 11/25/2023   BILITOT 0.6 11/25/2023   GFRNONAA 16 (L) 11/25/2023   GFRAA >60 01/17/2020    Lab Results  Component Value Date   WBC 3.7 (L) 11/25/2023   NEUTROABS 2.8 11/25/2023   HGB 11.1 (L) 11/25/2023   HCT 35.2 (L) 11/25/2023   MCV 98.3 11/25/2023   PLT 123 (L) 11/25/2023     STUDIES: No results found.   ASSESSMENT: History of CLL, ITP  PLAN:    CLL: In complete remission.  Bone marrow biopsy on August 03, 2023 did not reveal any monoclonal B-cell population and no other significant  pathology.  Cytogenetics was reported as normal. Peripheral blood flow cytometry is negative.  CT scan of the abdomen and pelvis on July 25, 2023 did not reveal any lymphadenopathy.  She  last received treatment with Rituxan  and Treanda  on April 09, 2015.  No intervention is needed at this time.  Return to clinic in 3 months with repeat laboratory work and further evaluation. Leukopenia: Improving.  Patient's total white blood cell count was 3.7 today. Anemia: Hemoglobin improved to 11.1.  Recent iron stores are within normal limits.   Elevated ferritin: Likely secondary to acute phase reactant from dialysis.   ITP:  Patient's platelet count was within normal limits between August 2023 and January 2024.  Between October and December 2024 patient's platelet count declined slightly to 120s to 130s.  More recently, her platelet count trended down to 10, but on extended prednisone  taper has increased to 80.  It continues to trend up and are now 123.  Bone marrow biopsy results negative as above.  All of her laboratory work including SPEP are either no or within normal limits.   End-stage renal disease: Patient reports she receives dialysis on Monday, Wednesdays, and Fridays.  Patient should be eligible for kidney transplant from a hematology standpoint. Nausea: Resolved.   Diarrhea: Improved.  Continue Lomotil  for Imodium  as needed.   Patient expressed understanding and was in agreement with this plan. She also understands that She can call clinic at any time with any questions, concerns, or complaints.    Evalene JINNY Reusing, MD   11/25/2023 2:04 PM

## 2023-11-29 ENCOUNTER — Ambulatory Visit (INDEPENDENT_AMBULATORY_CARE_PROVIDER_SITE_OTHER)

## 2023-11-29 ENCOUNTER — Ambulatory Visit (INDEPENDENT_AMBULATORY_CARE_PROVIDER_SITE_OTHER): Admitting: Podiatry

## 2023-11-29 ENCOUNTER — Encounter: Payer: Self-pay | Admitting: Podiatry

## 2023-11-29 VITALS — Ht 72.0 in | Wt 202.0 lb

## 2023-11-29 DIAGNOSIS — G629 Polyneuropathy, unspecified: Secondary | ICD-10-CM

## 2023-11-29 DIAGNOSIS — M19041 Primary osteoarthritis, right hand: Secondary | ICD-10-CM | POA: Diagnosis not present

## 2023-11-29 NOTE — Progress Notes (Signed)
 Chief Complaint  Patient presents with   Peripheral Neuropathy    Pt is here due to bilateral neuropathy, she states that her feet are getting so bad she is having trouble walking and standing, concerned with darkness around her ankles thinks it may be a circulation issue and bilateral toenails she states they are rotting off, pt has been doing physical therapy for 2 weeks and currently on dialysis     HPI: 58 y.o. female Hx T2DM presenting today for follow-up evaluation of her severe peripheral polyneuropathy to the bilateral feet.  This is been ongoing for few years now.  She takes gabapentin  with minimal relief.  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  Since the patient was last seen in the office she has had several episodes of falls.  She is very concerned because of her instability Patient also requesting nail debridement today  Past Medical History:  Diagnosis Date   Acute hemorrhoid 03/11/2015   Anxiety    Arthritis    Asthma    Cancer (HCC)    Chronic back pain    CLL (chronic lymphocytic leukemia) (HCC)    Depression    Diabetes mellitus without complication (HCC)    Genital herpes    type 2   Hypertension    Hypothyroidism    Vertigo     Past Surgical History:  Procedure Laterality Date   ABDOMINAL HYSTERECTOMY     BILATERAL SALPINGOOPHORECTOMY  2009   BREAST BIOPSY Right 05/17/2016   FIBROADENOMATOUS CHANGE AND SCLEROSING ADENOSIS WITH   COLONOSCOPY WITH PROPOFOL  N/A 06/16/2015   Procedure: COLONOSCOPY WITH PROPOFOL ;  Surgeon: Donnice Vaughn Manes, MD;  Location: [facility] ENDOSCOPY;  Service: Endoscopy;  Laterality: N/A;   DIALYSIS/PERMA CATHETER INSERTION N/A 12/17/2020   Procedure: DIALYSIS/PERMA CATHETER INSERTION;  Surgeon: Marea Selinda RAMAN, MD;  Location: [facility] INVASIVE CV LAB;  Service: Cardiovascular;  Laterality: N/A;   LAPAROSCOPIC SUPRACERVICAL HYSTERECTOMY  2009   due to leio   OOPHORECTOMY     TEMPORARY DIALYSIS CATHETER N/A 12/10/2020   Procedure:  TEMPORARY DIALYSIS CATHETER;  Surgeon: Jama Cordella MATSU, MD;  Location: [facility] INVASIVE CV LAB;  Service: Cardiovascular;  Laterality: N/A;   THORACIC LAMINECTOMY FOR EPIDURAL ABSCESS Bilateral 06/17/2020   Procedure: THORACIC LAMINECTOMY FOR EPIDURAL ABSCESS;  Surgeon: Bluford Standing, MD;  Location: [facility] ORS;  Service: Neurosurgery;  Laterality: Bilateral;    Allergies  Allergen Reactions   Amoxapine     Other Reaction(s): Unknown   Latex    Ceftazidime     Other reaction(s): Confusion, Hallucination, Unknown Encephalopathy - improved after dialysis/some concern for cephalosporin neurotoxicity Tolerated without confusion 06/2021. Encephalopathy - improved after dialysis/some concern for cephalosporin neurotoxicity Tolerated without confusion 06/2021.    Iodinated Contrast Media Hives    Other Reaction(s): Hives   Penicillins     Other Reaction(s): Unknown     Physical Exam: General: The patient is alert and oriented x3 in no acute distress.  Dermatology: Skin is cool, dry and supple bilateral lower extremities.  Hyperkeratotic dystrophic nails noted 1-5 bilateral  Vascular: Palpable pedal pulses bilaterally. Capillary refill within normal limits.  No appreciable edema.  No erythema.  Neurological: Light touch and protective threshold diminished.  Severe paresthesia with pins-and-needles noted bilateral toes and feet  Musculoskeletal Exam: No pedal deformities noted  Assessment/Plan of Care: 1.  Severe peripheral polyneuropathy bilateral feet  2.  History of spinal meningitis 2022 3.  Pain due to onychomycosis of toenails both  -Patient evaluated. -  Referral placed for pain clinic with Dr. Wallie Sherry to evaluate lumbar spine as well as possible spinal cord stimulator or management of the severe peripheral neuropathy -Also referral placed to orthopedic surgery, Dr. Erwin Alderton, for right hand arthritis as well as carpal tunnel -Return to clinic with me PRN     Thresa EMERSON Sar, DPM [facility]  Dr. Thresa EMERSON Sar, DPM    [address] [place], [place] [address]                Office [phone]  Fax [phone]

## 2023-12-06 ENCOUNTER — Encounter: Payer: Self-pay | Admitting: Student in an Organized Health Care Education/Training Program

## 2023-12-06 ENCOUNTER — Ambulatory Visit
Attending: Student in an Organized Health Care Education/Training Program | Admitting: Student in an Organized Health Care Education/Training Program

## 2023-12-06 VITALS — BP 141/81 | HR 81 | Temp 97.9°F | Resp 16 | Ht 72.0 in | Wt 202.0 lb

## 2023-12-06 DIAGNOSIS — E119 Type 2 diabetes mellitus without complications: Secondary | ICD-10-CM | POA: Diagnosis present

## 2023-12-06 DIAGNOSIS — N186 End stage renal disease: Secondary | ICD-10-CM | POA: Insufficient documentation

## 2023-12-06 DIAGNOSIS — G5603 Carpal tunnel syndrome, bilateral upper limbs: Secondary | ICD-10-CM | POA: Diagnosis present

## 2023-12-06 DIAGNOSIS — E1122 Type 2 diabetes mellitus with diabetic chronic kidney disease: Secondary | ICD-10-CM

## 2023-12-06 DIAGNOSIS — R202 Paresthesia of skin: Secondary | ICD-10-CM | POA: Diagnosis present

## 2023-12-06 DIAGNOSIS — E1140 Type 2 diabetes mellitus with diabetic neuropathy, unspecified: Secondary | ICD-10-CM | POA: Insufficient documentation

## 2023-12-06 DIAGNOSIS — Z992 Dependence on renal dialysis: Secondary | ICD-10-CM | POA: Insufficient documentation

## 2023-12-06 DIAGNOSIS — G894 Chronic pain syndrome: Secondary | ICD-10-CM | POA: Insufficient documentation

## 2023-12-06 MED ORDER — AMITRIPTYLINE HCL 25 MG PO TABS: 25.0000 mg | ORAL_TABLET | Freq: Every day | ORAL | 2 refills | Status: AC

## 2023-12-06 NOTE — Patient Instructions (Addendum)
 Qutenza either Tues/Thurs or M,W after 1 pm (pt has dialysis) in 1-2 weeks B/L Carpal tunnel injection sometime in September after Qutenza

## 2023-12-06 NOTE — Progress Notes (Signed)
Rooming

## 2023-12-06 NOTE — Progress Notes (Deleted)
 Safety precautions to be maintained throughout the outpatient stay will include: orient to surroundings, keep bed in low position, maintain call bell within reach at all times, provide assistance with transfer out of bed and ambulation.

## 2023-12-06 NOTE — Progress Notes (Signed)
 PROVIDER NOTE: Interpretation of information contained herein should be left to medically-trained personnel. Specific patient instructions are provided elsewhere under Patient Instructions section of medical record. This document was created in part using AI and STT-dictation technology, any transcriptional errors that may result from this process are unintentional.  Patient: Tonya Myers  Service: E/M Encounter  Provider: Wallie Sherry, MD  DOB: Feb 01, 1966  Delivery: Face-to-face  Specialty: Interventional Pain Management  MRN: [id number]  Setting: Ambulatory outpatient facility  Specialty designation: 09  Type: New Patient  Location: Outpatient office facility  PCP: Tonya Agent, MD  DOS: 12/06/2023    Referring Prov.: Cantwell, Sherran BROCKS, PA*   Primary Reason(s) for Visit: Encounter for initial evaluation of one or more chronic problems (new to examiner) potentially causing chronic pain, and posing a threat to normal musculoskeletal function. (Level of risk: High) CC: Foot Pain and Hand Pain (Bilateral feet and hands)  HPI  Tonya Myers is a 58 y.o. year old, female patient, who comes for the first time to our practice referred by Tonya Sherran BROCKS, PA* for our initial evaluation of her chronic pain. She has Personal history of CLL (chronic lymphocytic leukemia); Acute hemorrhoid; DDD (degenerative disc disease), cervical; Cervical disc disorder with radiculopathy of cervical region; Cervical facet syndrome; DDD (degenerative disc disease), lumbar; Facet syndrome, lumbar; Bilateral occipital neuralgia; Sacroiliac joint dysfunction; DJD of shoulder; Hypothyroidism; Arthritis; Carpal tunnel syndrome, bilateral; Chronic right shoulder pain; Controlled type 2 diabetes mellitus without complication, without long-term current use of insulin  (HCC); Essential hypertension; Herpes simplex vulvovaginitis; Hyperlipidemia, mixed; Numbness and tingling; Uncontrolled type 2 diabetes mellitus with  hyperglycemia, without long-term current use of insulin  (HCC); Bacterial vaginosis; Acute encephalopathy; Hyperglycemic crisis in diabetes mellitus (HCC); Acute metabolic encephalopathy; Severe sepsis (HCC); Diabetic ketoacidosis without coma associated with type 2 diabetes mellitus (HCC); AKI (acute kidney injury) (HCC); Elevated LFTs; Pneumococcal meningitis; 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HCC); Epidural abscess; Hypernatremia; Hypomagnesemia; Obesity (BMI 30.0-34.9); Bacterial spinal epidural abscess; Meningitis; Labile blood glucose; Uncontrolled type 2 diabetes mellitus with hyperosmolar nonketotic hyperglycemia (HCC); Transaminitis; Sleep disturbance; Anemia of chronic disease; Slow transit constipation; Urinary retention; Hypokalemia; Chronic pain of both knees; Pain; Left upper quadrant abdominal pain; Transient neurologic deficit 12/15/21; Functional paraparesis; Quadriplegia (HCC); Wheelchair dependence; Right arm weakness; 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HCC); Thrombocytopenia (HCC); FUO (fever of unknown origin); Pancytopenia (HCC); Oral thrush; Syncope; Dyslipidemia; Type 2 diabetes mellitus with peripheral neuropathy (HCC); GERD without esophagitis; End-stage renal disease on hemodialysis (HCC); Elevated troponin; Pressure injury of skin; Sepsis (HCC); Stercoral colitis; Closed fracture of sternum; Chronic pancreatitis (HCC); Asthma; Anemia in ESRD (end-stage renal disease) (HCC); Type II diabetes mellitus with renal manifestations (HCC); UTI (urinary tract infection); History of herpes genitalis; Protein-calorie malnutrition, moderate (HCC); Sacral decubitus ulcer, stage IV (HCC); Dysarthria; Uremia; Stroke (HCC); Diarrhea; Chest pain; Near syncope; ESRD (end stage renal disease) (HCC); Peripheral neuropathy; Failure to thrive in adult/frailty; and Left patella fracture on their problem list. Today she comes in for evaluation of her Foot Pain and Hand Pain (Bilateral feet and hands)  Pain Assessment: Location: Right,  Left Foot (hands) Radiating:   Onset: More than a month ago Duration: Neuropathic pain Quality: Burning, Aching, Spasm Severity: 10-Worst pain ever/10 (subjective, self-reported pain score)  Effect on ADL:   Timing: Constant Modifying factors: nothing BP: (!) 141/81  HR: 81  Onset and Duration: Gradual Cause of pain: Unknown Severity: Getting worse, NAS-11 at its worse: 10/10, NAS-11 at its best: 10/10, NAS-11 now: 10/10, and NAS-11 on the average: 10/10 Timing: Not  influenced by the time of the day Aggravating Factors: Walking, Walking uphill, and Walking downhill Alleviating Factors: nothing Associated Problems: Night-time cramps, Fatigue, Nausea, Numbness, Sadness, Spasms, Swelling, Tingling, Vomiting , Weakness, Pain that wakes patient up, and Pain that does not allow patient to sleep Quality of Pain: Aching, Agonizing, Annoying, Burning, Constant, Cruel, Deep, Disabling, Distressing, Dreadful, Dull, Exhausting, Fearful, Itching, Nagging, Sharp, and Throbbing Previous Examinations or Tests: CT scan, MRI scan, and X-rays Previous Treatments: The patient denies left blank  Tonya Myers is being evaluated for possible interventional pain management therapies for the treatment of her chronic pain.    Discussed the use of AI scribe software for clinical note transcription with the patient, who gave verbal consent to proceed.  History of Present Illness   Tonya Myers is a 58 year old female with end stage renal disease secondary to diabetes who presents with worsening neuropathic pain in her feet and hands.  She experiences burning, tingling, and spasms of pain in both feet and hands, particularly after dialysis sessions. The pain has worsened over time, making it increasingly difficult to walk and has resulted in seven falls since the beginning of the year, which she attributes to the pain in her feet.  She has a history of diabetes and end stage renal disease. She is not  currently taking any medication for diabetes as it is reportedly controlled. She has tried gabapentin  at a dose of 300 mg twice daily but discontinued it due to side effects including twitching and stuttering. Tramadol  was also ineffective for her pain. She is currently taking a low dose of amitriptyline , possibly 10 mg, which she has been on for four months. She experiences difficulty sleeping at night, leading to daytime fatigue and weakness.  In addition to neuropathy, she has chronic lymphocytic leukemia and is under the care of Dr. Jacobo for this condition. She also experiences arthritis and carpal tunnel syndrome in her hand, which has affected her ability to use her hands, although one hand has improved over time.      Meds   Current Outpatient Medications:    acetaminophen  (TYLENOL ) 325 MG tablet, Take 2 tablets (650 mg total) by mouth every 6 (six) hours as needed for mild pain (or Fever >/= 101)., Disp: , Rfl:    albuterol  (VENTOLIN  HFA) 108 (90 Base) MCG/ACT inhaler, Inhale 2 puffs into the lungs every 6 (six) hours as needed for wheezing or shortness of breath., Disp: , Rfl:    amitriptyline  (ELAVIL ) 25 MG tablet, Take 1 tablet (25 mg total) by mouth at bedtime., Disp: 30 tablet, Rfl: 2   atorvastatin  (LIPITOR) 20 MG tablet, Take 20 mg by mouth daily., Disp: , Rfl:    Blood Glucose Monitoring Suppl DEVI, 1 each by Does not apply route 3 (three) times daily. May dispense any manufacturer covered by patient's insurance., Disp: 1 each, Rfl: 0   calcium  acetate (PHOSLO ) 667 MG capsule, Take 1,334 mg by mouth 3 (three) times daily with meals., Disp: , Rfl:    cetirizine (ZYRTEC) 10 MG tablet, Take 1 tablet by mouth daily., Disp: , Rfl:    fluticasone  (FLONASE ) 50 MCG/ACT nasal spray, Place 2 sprays into both nostrils daily., Disp: , Rfl:    folic acid  (FOLVITE ) 1 MG tablet, Take 1 tablet by mouth daily., Disp: , Rfl:    Glucose Blood (BLOOD GLUCOSE TEST STRIPS) STRP, 1 each by Does not  apply route 3 (three) times daily. Use as directed to check blood sugar.  May dispense any manufacturer covered by patient's insurance and fits patient's device., Disp: 100 strip, Rfl: 0   hydrOXYzine  (ATARAX ) 25 MG tablet, Take 1 tablet (25 mg total) by mouth 3 (three) times daily as needed for itching., Disp: 30 tablet, Rfl: 0   Lancet Device MISC, 1 each by Does not apply route 3 (three) times daily. May dispense any manufacturer covered by patient's insurance., Disp: 1 each, Rfl: 0   Lancets MISC, 1 each by Does not apply route 3 (three) times daily. Use as directed to check blood sugar. May dispense any manufacturer covered by patient's insurance and fits patient's device., Disp: 100 each, Rfl: 0   levothyroxine  (SYNTHROID ) 125 MCG tablet, Take 125 mcg by mouth daily., Disp: , Rfl:    lipase/protease/amylase (CREON ) 12000-38000 units CPEP capsule, Take 1 capsule by mouth., Disp: , Rfl:    midodrine  (PROAMATINE ) 2.5 MG tablet, Take 2.5 mg by mouth., Disp: , Rfl:    multivitamin (RENA-VIT) TABS tablet, Take 1 tablet by mouth at bedtime., Disp: , Rfl: 0   ondansetron  (ZOFRAN -ODT) 8 MG disintegrating tablet, Take 1 tablet (8 mg total) by mouth every 8 (eight) hours as needed for nausea or vomiting., Disp: 30 tablet, Rfl: 0   pantoprazole  (PROTONIX ) 40 MG tablet, Take 1 tablet (40 mg total) by mouth 2 (two) times daily for 28 days, THEN 1 tablet (40 mg total) daily., Disp: 30 tablet, Rfl: 1   promethazine  (PHENERGAN ) 12.5 MG tablet, Take 1 tablet (12.5 mg total) by mouth every 6 (six) hours as needed., Disp: 12 tablet, Rfl: 0   traMADol  (ULTRAM ) 50 MG tablet, Take 1 tablet (50 mg total) by mouth every 8 (eight) hours as needed. (Patient taking differently: Take 50 mg by mouth at bedtime.), Disp: 90 tablet, Rfl: 0   zinc  sulfate 220 (50 Zn) MG capsule, Take 1 capsule (220 mg total) by mouth daily., Disp: , Rfl:    [Paused] telmisartan  (MICARDIS ) 80 MG tablet, Take 80 mg by mouth daily. (Patient not  taking: Reported on 12/06/2023), Disp: , Rfl:    valACYclovir  (VALTREX ) 500 MG tablet, Take 500 mg by mouth., Disp: , Rfl:   Imaging Review  Cervical Imaging: Cervical MR wo contrast: Results for orders placed during the hospital encounter of 06/09/20  MR CERVICAL SPINE WO CONTRAST  Narrative CLINICAL DATA:  Septic arthritis  EXAM: MRI CERVICAL AND THORACIC SPINE WITHOUT CONTRAST  TECHNIQUE: Multiplanar and multiecho pulse sequences of the cervical spine, to include the craniocervical junction and cervicothoracic junction, and the thoracic spine, were obtained without intravenous contrast.  COMPARISON:  MRI June 11, 2020.  FINDINGS: MRI CERVICAL SPINE FINDINGS  Motion limited examination.  Alignment: Normal.  Vertebrae: Vertebral body heights are maintained. No focal marrow signal or disc signal abnormality to suggest discitis/osteomyelitis.  Canal/Cord: Significantly limited evaluation given motion without definite cord signal abnormality or canal fluid collection.  Posterior Fossa, vertebral arteries, paraspinal tissues: There is edema in the posterior paraspinal soft tissues in the upper cervical spine. No evidence of discrete fluid collection.  Disc levels:  C2-C3: Posterior disc osteophyte complex with mass effect on the ventral thecal sac. No significant foraminal stenosis.  C3-C4: Left eccentric posterior disc osteophyte complex with mass effect on the thecal sac and deformity of the ventral cord with at least moderate canal stenosis. Bilateral facet and uncovertebral hypertrophy. Evaluation of the foramina is limited with possibly moderate bilateral foraminal stenosis  C4-C5: Posterior disc osteophyte complex with mass effect on the thecal sac and  suspected moderate canal stenosis. Bilateral facet and uncovertebral hypertrophy. Evaluation of foramina is limited with suspected moderate bilateral foraminal stenosis.  C5-C6: Posterior disc osteophyte  complex with disc contacting the ventral cord and suspected moderate canal stenosis. Evaluation of the foramina is limited with possibly severe bilateral foraminal stenosis  C6-C7: Posterior disc osteophyte complex with bilateral facet and uncovertebral hypertrophy.  C7-T1: No significant disc protrusion, foraminal stenosis, or canal stenosis.  MRI THORACIC SPINE FINDINGS  Alignment:  Normal.  Vertebrae: Vertebral body heights are maintained. No focal marrow edema to suggest osteomyelitis.  Canal/Cord: There is a large fluid collection within the dorsal epidural space spanning the entire thoracic spine (C7-T1 to T12-L1), measuring up to 1.0 x 1.5 cm at T7-T8. The collection appears increased in size to the prior with increased effacement of CSF around the cord. The epidural fluid collection along with a posterior disc osteophyte at T6-T7 results in severe canal stenosis at this level with near complete effacement of CSF. There is mild canal stenosis at T2-T3, T3-T4, T10-T11 and T11-T12 and moderate canal stenosis at T4-T5, T5-T6, T7-T8, T8-T9, T9-T10. No evidence of abnormal cord signal.  Paraspinal and other soft tissues: Edema in the posterior paraspinal soft tissues in the upper thoracic spine and lower thoracic spine. Nonspecific T2 hyperintense hepatic lesion, not well characterized on this study. Bibasilar lung opacities, better characterized on recent chest radiograph.  Disc levels:  Canal stenosis is detailed above. Suspected moderate left foraminal stenosis at T8-T9.  IMPRESSION: Motion limited study.  1. Large dorsal epidural fluid collection spanning the entirety of the thoracic spine, increased in size relative to February 16 MRI. The collection along with degenerative change results in severe canal stenosis at T6-T7 with near complete effacement of CSF and multilevel moderate canal stenosis in the thoracic spine, progressed since the prior and detailed  above. While nonspecific by imaging, findings are concerning for epidural abscess given the patient's clinical history. Postcontrast imaging could provide more complete evaluation if clinically indicated. 2. Edema within the paraspinal soft tissues in the upper cervical and thoracic spine and lower thoracic spine, concerning for infection/cellulitis given the clinical history. No discrete drainable fluid collection identified in the paraspinal soft tissues of the cervical or thoracic spine identified, although evaluation is limited by motion and absence of IV contrast. Please see separately dictated lumbar spine for infectious findings in the lumbar spine. 3. Evaluation of degenerative change in the cervical spine is significantly limited given motion. Multilevel suspected at least moderate canal stenosis, worst on the left at C3-C4 where posterior disc osteophyte contacts and deforms the left cord with effacement of the left root entry zone. Evaluation of the foramina is particularly limited, but there is suspected at least moderate multilevel foraminal stenosis in the cervical spine and on the left at T8-T9. Repeat MRI (possibly with sedation) could further characterize if clinically indicated.  Critical findings discussed with Dr. Lindzen via telephone at 1:09 PM.   Electronically Signed By: Gilmore GORMAN Molt MD On: 06/17/2020 13:22  Cervical MR wo contrast: No results found for this or any previous visit.  Cervical MR w/wo contrast: Results for orders placed during the hospital encounter of 11/19/20  MR CERVICAL SPINE W WO CONTRAST  Narrative CLINICAL DATA:  History of epidural abscess and paraspinal abscess in the setting of recurrent fever and altered mental status.  EXAM: MRI CERVICAL, THORACIC AND LUMBAR SPINE WITHOUT AND WITH CONTRAST  TECHNIQUE: Multiplanar and multiecho pulse sequences of the cervical spine, to include the  craniocervical junction and  cervicothoracic junction, and thoracic and lumbar spine, were obtained without and with intravenous contrast.  CONTRAST:  8mL GADAVIST  GADOBUTROL  1 MMOL/ML IV SOLN  COMPARISON:  MRI of the cervical, thoracic and lumbar spine June 17, 2020.  FINDINGS: The study is partially degraded by motion. Postcontrast axial images are essentially nondiagnostic.  MRI CERVICAL SPINE FINDINGS  Alignment: Physiologic.  Vertebrae: No fracture, evidence of discitis, or bone lesion.  Cord: Increased T2 signal within the cord at the C3 level.  Posterior Fossa, vertebral arteries, paraspinal tissues: Negative.  Disc levels:  C2-3: Posterior disc protrusion causing mild mass effect on the cord without significant spinal canal stenosis. Facet degenerative changes resulting mild left neural foraminal narrowing.  C3-4: Left posterolateral disc protrusion causing moderate to severe spinal canal stenosis with mass effect on the cord. Increased T2 signal is seen within the cord immediately above the level of compression, likely edema. Uncovertebral and facet degenerative changes resulting in moderate right and severe left neural foraminal narrowing.  C4-5: Posterior disc protrusion asymmetric to the left causing mass effect on the cord and resulting in moderate spinal canal stenosis. Uncovertebral and facet degenerative changes resulting in severe bilateral neural foraminal narrowing.  C5-6: Posterior disc osteophyte complex causing mass effect on the cord and resulting in moderate spinal canal stenosis. Uncovertebral and facet degenerative changes resulting severe bilateral neural foraminal narrowing.  C6-7: Left posterolateral disc protrusion causing indentation of the thecal sac and resulting mild spinal canal stenosis. Uncovertebral and facet degenerative changes resulting in mild bilateral neural foraminal narrowing.  C7-T1: Left posterolateral disc protrusion causing indentation  on the thecal sac and resulting in mild left neural foraminal narrowing. No significant spinal canal stenosis.  MRI THORACIC SPINE FINDINGS  Alignment:  Physiologic.  Vertebrae: No fracture, evidence of discitis, or bone lesion. Postsurgical changes from T3-4, T5-7 and T9-10 laminectomies. Interval resolution of the large posterior epidural fluid collection.  Cord: Central increased T2 signal within the cord at T6-7, T7-8 and from the T8-9 level to the conus medullaris.  Paraspinal and other soft tissues: Small bilateral pleural effusion. A 1.5 cm T2 hyperintense hepatic lesion.  Disc levels:  Tiny posterior disc protrusions at T3-4, T5-6, T6-7 and T7-8 causing minimal indentation on the thecal sac. No significant spinal canal or neural foraminal stenosis at any thoracic level.  MRI LUMBAR SPINE FINDINGS  Segmentation:  Standard.  Alignment:  Physiologic.  Vertebrae: No fracture, evidence of discitis, or bone lesion. Endplate degenerative changes at L5-S1.  Conus medullaris and cauda equina: Conus extends to the L1-2 level. Increased T2 signal within the conus medullaris, as described above. 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  Paraspinal and other soft tissues: Negative.  Disc levels:  T12-L1: No spinal canal or neural foraminal stenosis.  L1-2: No spinal canal or neural foraminal stenosis.  L2-3: Shallow disc bulge and moderate facet degenerative changes without significant spinal canal or neural foraminal stenosis.  L3-4: Moderate facet degenerative changes. No spinal canal or neural foraminal stenosis.  L4-5: Mild loss of disc height, disc bulge and moderate facet degenerative changes without significant spinal canal or neural foraminal stenosis.  L5-S1: Loss of disc height, disc bulge with superimposed left central/subarticular disc protrusion and moderate facet  degenerative changes. Findings result in narrowing of the left subarticular zone with displacement of the traversing left S1 nerve root. No significant neural foraminal narrowing.  IMPRESSION: 1. The study is degraded by motion,  particularly the postcontrast images. 2. Interval resolution of the large posterior thoracic epidural fluid collection with postsurgical changes from multilevel laminectomy noted in the thoracic spine. No new fluid collection identified. 3. Increased T2 signal within the cord centrally at T6-7, T7-8 and from the T8-9 level to the conus medullaris without cord expansion. This may represent post ischemic changes related to now resolved cord compression. 4. 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 5. 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 6. Multilevel high-grade neural foraminal narrowing at the cervical spine. 7. Mild degenerative changes of the thoracic spine without spinal canal or neural foraminal stenosis. 8. Degenerative disc disease at L5-S1 with narrowing of the left subarticular zone and likely impingement of the traversing left S1 nerve root.   Electronically Signed By: Katyucia  De Macedo Rodrigues M.D. On: 11/28/2020 15:41    Narrative CLINICAL DATA:  Weakness.  History of fall on Wednesday.  EXAM: CT HEAD WITHOUT CONTRAST  CT CERVICAL SPINE WITHOUT CONTRAST  TECHNIQUE: Multidetector CT imaging of the head and cervical spine was performed following the standard protocol without intravenous contrast. Multiplanar CT image reconstructions of the cervical spine were also generated.  RADIATION DOSE REDUCTION: This exam was performed according to the departmental dose-optimization program which includes automated exposure control, adjustment of the mA and/or kV  according to patient size and/or use of iterative reconstruction technique.  COMPARISON:  Head CT 12/15/2021  FINDINGS: CT HEAD FINDINGS  Brain: No evidence of acute infarction, hemorrhage, hydrocephalus, extra-axial collection or mass lesion/mass effect.  Vascular: No hyperdense vessel or unexpected calcification.  Skull: Normal. Negative for fracture or focal lesion.  Sinuses/Orbits: No evidence of injury  CT CERVICAL SPINE FINDINGS  Alignment: Normal.  Skull base and vertebrae: No acute fracture. No primary bone lesion or focal pathologic process.  Soft tissues and spinal canal: No prevertebral fluid or swelling. No visible canal hematoma.  Disc levels: Generalized disc space narrowing with endplate ridging and disc protrusions with buttressing osteophytes, especially seen at C3-4 and C4-5 where there is ventral cord contact.  Upper chest: Negative  IMPRESSION: 1. No evidence of acute intracranial or cervical spine injury. 2. 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   Electronically Signed By: Dorn Roulette M.D. On: 03/28/2023 07:39   MR THORACIC SPINE WO CONTRAST  Narrative CLINICAL DATA:  Septic arthritis  EXAM: MRI CERVICAL AND THORACIC SPINE WITHOUT CONTRAST  TECHNIQUE: Multiplanar and multiecho pulse sequences of the cervical spine, to include the craniocervical junction and cervicothoracic junction, and the thoracic spine, were obtained without intravenous contrast.  COMPARISON:  MRI June 11, 2020.  FINDINGS: MRI CERVICAL SPINE FINDINGS  Motion limited examination.  Alignment: Normal.  Vertebrae: Vertebral body heights are maintained. No focal marrow signal or disc signal abnormality to suggest discitis/osteomyelitis.  Canal/Cord: Significantly limited evaluation given motion without definite cord signal abnormality or canal fluid collection.  Posterior Fossa, vertebral  arteries, paraspinal tissues: There is edema in the posterior paraspinal soft tissues in the upper cervical spine. No evidence of discrete fluid collection.  Disc levels:  C2-C3: Posterior disc osteophyte complex with mass effect on the ventral thecal sac. No significant foraminal stenosis.  C3-C4: Left eccentric posterior disc osteophyte complex with mass effect on the thecal sac and deformity of the ventral cord with  at least moderate canal stenosis. Bilateral facet and uncovertebral hypertrophy. Evaluation of the foramina is limited with possibly moderate bilateral foraminal stenosis  C4-C5: Posterior disc osteophyte complex with mass effect on the thecal sac and suspected moderate canal stenosis. Bilateral facet and uncovertebral hypertrophy. Evaluation of foramina is limited with suspected moderate bilateral foraminal stenosis.  C5-C6: Posterior disc osteophyte complex with disc contacting the ventral cord and suspected moderate canal stenosis. Evaluation of the foramina is limited with possibly severe bilateral foraminal stenosis  C6-C7: Posterior disc osteophyte complex with bilateral facet and uncovertebral hypertrophy.  C7-T1: No significant disc protrusion, foraminal stenosis, or canal stenosis.  MRI THORACIC SPINE FINDINGS  Alignment:  Normal.  Vertebrae: Vertebral body heights are maintained. No focal marrow edema to suggest osteomyelitis.  Canal/Cord: There is a large fluid collection within the dorsal epidural space spanning the entire thoracic spine (C7-T1 to T12-L1), measuring up to 1.0 x 1.5 cm at T7-T8. The collection appears increased in size to the prior with increased effacement of CSF around the cord. The epidural fluid collection along with a posterior disc osteophyte at T6-T7 results in severe canal stenosis at this level with near complete effacement of CSF. There is mild canal stenosis at T2-T3, T3-T4, T10-T11 and T11-T12 and moderate canal  stenosis at T4-T5, T5-T6, T7-T8, T8-T9, T9-T10. No evidence of abnormal cord signal.  Paraspinal and other soft tissues: Edema in the posterior paraspinal soft tissues in the upper thoracic spine and lower thoracic spine. Nonspecific T2 hyperintense hepatic lesion, not well characterized on this study. Bibasilar lung opacities, better characterized on recent chest radiograph.  Disc levels:  Canal stenosis is detailed above. Suspected moderate left foraminal stenosis at T8-T9.  IMPRESSION: Motion limited study.  1. Large dorsal epidural fluid collection spanning the entirety of the thoracic spine, increased in size relative to February 16 MRI. The collection along with degenerative change results in severe canal stenosis at T6-T7 with near complete effacement of CSF and multilevel moderate canal stenosis in the thoracic spine, progressed since the prior and detailed above. While nonspecific by imaging, findings are concerning for epidural abscess given the patient's clinical history. Postcontrast imaging could provide more complete evaluation if clinically indicated. 2. Edema within the paraspinal soft tissues in the upper cervical and thoracic spine and lower thoracic spine, concerning for infection/cellulitis given the clinical history. No discrete drainable fluid collection identified in the paraspinal soft tissues of the cervical or thoracic spine identified, although evaluation is limited by motion and absence of IV contrast. Please see separately dictated lumbar spine for infectious findings in the lumbar spine. 3. Evaluation of degenerative change in the cervical spine is significantly limited given motion. Multilevel suspected at least moderate canal stenosis, worst on the left at C3-C4 where posterior disc osteophyte contacts and deforms the left cord with effacement of the left root entry zone. Evaluation of the foramina is particularly limited, but there is suspected  at least moderate multilevel foraminal stenosis in the cervical spine and on the left at T8-T9. Repeat MRI (possibly with sedation) could further characterize if clinically indicated.  Critical findings discussed with Dr. Lindzen via telephone at 1:09 PM.   Electronically Signed By: Gilmore GORMAN Molt MD On: 06/17/2020 13:22    Narrative CLINICAL DATA:  History of epidural abscess and paraspinal abscess in the setting of recurrent fever and altered mental status.  EXAM: MRI CERVICAL, THORACIC AND LUMBAR SPINE WITHOUT AND WITH CONTRAST  TECHNIQUE: Multiplanar and multiecho pulse sequences of the cervical spine, to  include the craniocervical junction and cervicothoracic junction, and thoracic and lumbar spine, were obtained without and with intravenous contrast.  CONTRAST:  8mL GADAVIST  GADOBUTROL  1 MMOL/ML IV SOLN  COMPARISON:  MRI of the cervical, thoracic and lumbar spine June 17, 2020.  FINDINGS: The study is partially degraded by motion. Postcontrast axial images are essentially nondiagnostic.  MRI CERVICAL SPINE FINDINGS  Alignment: Physiologic.  Vertebrae: No fracture, evidence of discitis, or bone lesion.  Cord: Increased T2 signal within the cord at the C3 level.  Posterior Fossa, vertebral arteries, paraspinal tissues: Negative.  Disc levels:  C2-3: Posterior disc protrusion causing mild mass effect on the cord without significant spinal canal stenosis. Facet degenerative changes resulting mild left neural foraminal narrowing.  C3-4: Left posterolateral disc protrusion causing moderate to severe spinal canal stenosis with mass effect on the cord. Increased T2 signal is seen within the cord immediately above the level of compression, likely edema. Uncovertebral and facet degenerative changes resulting in moderate right and severe left neural foraminal narrowing.  C4-5: Posterior disc protrusion asymmetric to the left causing mass effect on the  cord and resulting in moderate spinal canal stenosis. Uncovertebral and facet degenerative changes resulting in severe bilateral neural foraminal narrowing.  C5-6: Posterior disc osteophyte complex causing mass effect on the cord and resulting in moderate spinal canal stenosis. Uncovertebral and facet degenerative changes resulting severe bilateral neural foraminal narrowing.  C6-7: Left posterolateral disc protrusion causing indentation of the thecal sac and resulting mild spinal canal stenosis. Uncovertebral and facet degenerative changes resulting in mild bilateral neural foraminal narrowing.  C7-T1: Left posterolateral disc protrusion causing indentation on the thecal sac and resulting in mild left neural foraminal narrowing. No significant spinal canal stenosis.  MRI THORACIC SPINE FINDINGS  Alignment:  Physiologic.  Vertebrae: No fracture, evidence of discitis, or bone lesion. Postsurgical changes from T3-4, T5-7 and T9-10 laminectomies. Interval resolution of the large posterior epidural fluid collection.  Cord: Central increased T2 signal within the cord at T6-7, T7-8 and from the T8-9 level to the conus medullaris.  Paraspinal and other soft tissues: Small bilateral pleural effusion. A 1.5 cm T2 hyperintense hepatic lesion.  Disc levels:  Tiny posterior disc protrusions at T3-4, T5-6, T6-7 and T7-8 causing minimal indentation on the thecal sac. No significant spinal canal or neural foraminal stenosis at any thoracic level.  MRI LUMBAR SPINE FINDINGS  Segmentation:  Standard.  Alignment:  Physiologic.  Vertebrae: No fracture, evidence of discitis, or bone lesion. Endplate degenerative changes at L5-S1.  Conus medullaris and cauda equina: Conus extends to the L1-2 level. Increased T2 signal within the conus medullaris, as described above. 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  Paraspinal  and other soft tissues: Negative.  Disc levels:  T12-L1: No spinal canal or neural foraminal stenosis.  L1-2: No spinal canal or neural foraminal stenosis.  L2-3: Shallow disc bulge and moderate facet degenerative changes without significant spinal canal or neural foraminal stenosis.  L3-4: Moderate facet degenerative changes. No spinal canal or neural foraminal stenosis.  L4-5: Mild loss of disc height, disc bulge and moderate facet degenerative changes without significant spinal canal or neural foraminal stenosis.  L5-S1: Loss of disc height, disc bulge with superimposed left central/subarticular disc protrusion and moderate facet degenerative changes. Findings result in narrowing of the left subarticular zone with displacement of the traversing left S1 nerve root. No significant neural foraminal narrowing.  IMPRESSION: 1. The study is degraded by  motion, particularly the postcontrast images. 2. Interval resolution of the large posterior thoracic epidural fluid collection with postsurgical changes from multilevel laminectomy noted in the thoracic spine. No new fluid collection identified. 3. Increased T2 signal within the cord centrally at T6-7, T7-8 and from the T8-9 level to the conus medullaris without cord expansion. This may represent post ischemic changes related to now resolved cord compression. 4. 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 5. 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 6. Multilevel high-grade neural foraminal narrowing at the cervical spine. 7. Mild degenerative changes of the thoracic spine without spinal canal or neural foraminal stenosis. 8. Degenerative disc disease at L5-S1 with narrowing of the left subarticular zone and likely impingement of the traversing left S1 nerve  root.   Electronically Signed By: Katyucia  De Macedo Rodrigues M.D. On: 11/28/2020 15:41    Narrative CLINICAL DATA:  Bilateral thoracic laminectomy for epidural abscess  EXAM: THORACIC SPINE 2 VIEWS; DG C-ARM 1-60 MIN-NO REPORT  COMPARISON:  MR 06/17/2020  FINDINGS: Intraoperative fluoroscopy was obtained during the process of a thoracic laminectomy for evacuation of an epidural abscess. Image #5 demonstrates a blunt surgical implement projects over the T4 vertebral level on the frontal radiograph. Surgical retractors seen posteriorly.  Images labeled as #3,4 are more difficult to localize given the absence of localizing landmarks. Recommend correlation with operative report.  IMPRESSION: Intraoperative fluoroscopy obtained during the process of a thoracic laminectomy for evacuation of an epidural abscess.   Electronically Signed By: Gretel Edis M.D.   MR LUMBAR SPINE WO CONTRAST  Narrative CLINICAL DATA:  Pneumococcal meningitis. Follow-up lumbar spine infection. New fever today.  EXAM: MRI LUMBAR SPINE WITHOUT CONTRAST  TECHNIQUE: Multiplanar, multisequence MR imaging of the lumbar spine was performed. No intravenous contrast was administered.  COMPARISON:  Lumbar MRI 06/11/2020  FINDINGS: Segmentation:  Normal  Alignment:  Normal  Vertebrae: No evidence of discitis. No fracture or bone marrow edema.  Conus medullaris and cauda equina: Conus extends to the L1-2 level. Conus and cauda equina appear normal.  Paraspinal and other soft tissues: Posterior paraspinous muscle edema and multiple abscesses have progressed in the interval. Fluid collections are present in the muscles which appear to be associated with the facet joints on the left at L2-3 and L3-4. Abscesses appears slightly larger with increased surrounding edema. No right-sided abscess identified. Small epidural abscess posterior on the left at L2-3 is unchanged.  Disc  levels:  L1-2: Negative  L2-3: Shallow central disc protrusion with mild spinal stenosis. Fluid in the left facet joint which is contiguous with a posterior paraspinous muscle abscess. Small posterior epidural abscess on the left unchanged.  L3-4: Shallow ventral epidural fluid collection has progressed compatible with abscess. Bilateral facet degeneration. Fluid in the left facet joint appears contiguous with the posterior paraspinous muscle abscess. No significant spinal stenosis  L4-5: Shallow central disc protrusion. Bilateral facet degeneration. Mild subarticular stenosis bilaterally  L5-S1: Moderate disc degeneration with disc space narrowing. Shallow central and left-sided disc protrusion unchanged. Possible left S1 nerve root impingement. Bilateral mild facet degeneration.  IMPRESSION: Findings compatible with lumbar spinal infection. There appears to be septic arthritis in the left L2-3 and L3-4 facets with adjacent paraspinous abscesses. Paraspinous abscesses and muscle edema appear mildly progressive.  Ventral epidural abscess at L3-4 is small but appears  mildly progressive. Small posterior epidural abscess on the left at L2-3 is unchanged.   Electronically Signed By: Carlin Gaskins M.D. On: 06/16/2020 16:06  Lumbar MR wo contrast: No results found for this or any previous visit.  Lumbar MR w/wo contrast: Results for orders placed during the hospital encounter of 11/19/20  MR Lumbar Spine W Wo Contrast  Narrative CLINICAL DATA:  History of epidural abscess and paraspinal abscess in the setting of recurrent fever and altered mental status.  EXAM: MRI CERVICAL, THORACIC AND LUMBAR SPINE WITHOUT AND WITH CONTRAST  TECHNIQUE: Multiplanar and multiecho pulse sequences of the cervical spine, to include the craniocervical junction and cervicothoracic junction, and thoracic and lumbar spine, were obtained without and with intravenous contrast.  CONTRAST:  8mL  GADAVIST  GADOBUTROL  1 MMOL/ML IV SOLN  COMPARISON:  MRI of the cervical, thoracic and lumbar spine June 17, 2020.  FINDINGS: The study is partially degraded by motion. Postcontrast axial images are essentially nondiagnostic.  MRI CERVICAL SPINE FINDINGS  Alignment: Physiologic.  Vertebrae: No fracture, evidence of discitis, or bone lesion.  Cord: Increased T2 signal within the cord at the C3 level.  Posterior Fossa, vertebral arteries, paraspinal tissues: Negative.  Disc levels:  C2-3: Posterior disc protrusion causing mild mass effect on the cord without significant spinal canal stenosis. Facet degenerative changes resulting mild left neural foraminal narrowing.  C3-4: Left posterolateral disc protrusion causing moderate to severe spinal canal stenosis with mass effect on the cord. Increased T2 signal is seen within the cord immediately above the level of compression, likely edema. Uncovertebral and facet degenerative changes resulting in moderate right and severe left neural foraminal narrowing.  C4-5: Posterior disc protrusion asymmetric to the left causing mass effect on the cord and resulting in moderate spinal canal stenosis. Uncovertebral and facet degenerative changes resulting in severe bilateral neural foraminal narrowing.  C5-6: Posterior disc osteophyte complex causing mass effect on the cord and resulting in moderate spinal canal stenosis. Uncovertebral and facet degenerative changes resulting severe bilateral neural foraminal narrowing.  C6-7: Left posterolateral disc protrusion causing indentation of the thecal sac and resulting mild spinal canal stenosis. Uncovertebral and facet degenerative changes resulting in mild bilateral neural foraminal narrowing.  C7-T1: Left posterolateral disc protrusion causing indentation on the thecal sac and resulting in mild left neural foraminal narrowing. No significant spinal canal stenosis.  MRI THORACIC SPINE  FINDINGS  Alignment:  Physiologic.  Vertebrae: No fracture, evidence of discitis, or bone lesion. Postsurgical changes from T3-4, T5-7 and T9-10 laminectomies. Interval resolution of the large posterior epidural fluid collection.  Cord: Central increased T2 signal within the cord at T6-7, T7-8 and from the T8-9 level to the conus medullaris.  Paraspinal and other soft tissues: Small bilateral pleural effusion. A 1.5 cm T2 hyperintense hepatic lesion.  Disc levels:  Tiny posterior disc protrusions at T3-4, T5-6, T6-7 and T7-8 causing minimal indentation on the thecal sac. No significant spinal canal or neural foraminal stenosis at any thoracic level.  MRI LUMBAR SPINE FINDINGS  Segmentation:  Standard.  Alignment:  Physiologic.  Vertebrae: No fracture, evidence of discitis, or bone lesion. Endplate degenerative changes at L5-S1.  Conus medullaris and cauda equina: Conus extends to the L1-2 level. Increased T2 signal within the conus medullaris, as described above. 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  Paraspinal and other soft tissues: Negative.  Disc levels:  T12-L1: No spinal canal or neural foraminal stenosis.  L1-2: No spinal canal  or neural foraminal stenosis.  L2-3: Shallow disc bulge and moderate facet degenerative changes without significant spinal canal or neural foraminal stenosis.  L3-4: Moderate facet degenerative changes. No spinal canal or neural foraminal stenosis.  L4-5: Mild loss of disc height, disc bulge and moderate facet degenerative changes without significant spinal canal or neural foraminal stenosis.  L5-S1: Loss of disc height, disc bulge with superimposed left central/subarticular disc protrusion and moderate facet degenerative changes. Findings result in narrowing of the left subarticular zone with displacement of the traversing left S1 nerve root. No significant  neural foraminal narrowing.  IMPRESSION: 1. The study is degraded by motion, particularly the postcontrast images. 2. Interval resolution of the large posterior thoracic epidural fluid collection with postsurgical changes from multilevel laminectomy noted in the thoracic spine. No new fluid collection identified. 3. Increased T2 signal within the cord centrally at T6-7, T7-8 and from the T8-9 level to the conus medullaris without cord expansion. This may represent post ischemic changes related to now resolved cord compression. 4. 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 5. 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 6. Multilevel high-grade neural foraminal narrowing at the cervical spine. 7. Mild degenerative changes of the thoracic spine without spinal canal or neural foraminal stenosis. 8. Degenerative disc disease at L5-S1 with narrowing of the left subarticular zone and likely impingement of the traversing left S1 nerve root.   Electronically Signed By: Katyucia  De Macedo Rodrigues M.D. On: 11/28/2020 15:41  DG Knee 1-2 Views Right  Narrative CLINICAL DATA:  Right knee pain and swelling, initial encounter  EXAM: RIGHT KNEE - 2 VIEW  COMPARISON:  07/21/2020  FINDINGS: Degenerative changes are noted in the medial joint space and patellofemoral space. Significant increase in joint effusion is noted. No definitive fracture is seen.  IMPRESSION: Degenerative change with large joint effusion.   Electronically Signed By: Oneil Devonshire M.D. On: 07/22/2020 19:21   Narrative CLINICAL DATA:  Ground level fall.  EXAM: RIGHT KNEE - COMPLETE 4+ VIEW  COMPARISON:  None Available.  FINDINGS: No evidence of an acute fracture. Mild dorsal displacement of the right patella is noted which may be  positional in nature. Moderate severity tricompartmental degenerative changes are noted. There is a small joint effusion.  IMPRESSION: 1. No evidence of acute osseous abnormality. 2. Mild dorsal displacement of the right patella which may be positional in nature. Correlation with physical examination is recommended. 3. Moderate severity degenerative changes. 4. Small joint effusion.   Electronically Signed By: Suzen Dials M.D. On: 08/12/2023 19:47  Knee-L DG 4 views: Results for orders placed during the hospital encounter of 08/12/23  DG Knee Complete 4 Views Left  Narrative CLINICAL DATA:  Ground level fall.  EXAM: LEFT KNEE - COMPLETE 4+ VIEW  COMPARISON:  None Available.  FINDINGS: A small minimally displaced fracture fragment is seen along the dorsal lateral aspect of the left patella. There is no evidence of dislocation. A small area of medial cortical exostosis is seen along the supracondylar region of the distal left femur. Moderate severity tricompartmental degenerative changes are noted. A very small joint effusion is seen.  IMPRESSION: 1. Findings suspicious for a small fracture of the dorsal lateral aspect of the left patella. Correlation with physical examination is recommended to determine the presence of point tenderness. 2. Mild cortical exostosis  along the medial aspect of the distal left femur. 3. Moderate severity degenerative changes. 4. Very small joint effusion.   Electronically Signed By: Suzen Dials M.D. On: 08/12/2023 19:50   Complexity Note: Imaging results reviewed.                         ROS  Cardiovascular: No reported cardiovascular signs or symptoms such as High blood pressure, coronary artery disease, abnormal heart rate or rhythm, heart attack, blood thinner therapy or heart weakness and/or failure Pulmonary or Respiratory: No reported pulmonary signs or symptoms such as wheezing and difficulty taking a deep full  breath (Asthma), difficulty blowing air out (Emphysema), coughing up mucus (Bronchitis), persistent dry cough, or temporary stoppage of breathing during sleep Neurological: Seizure disorder Psychological-Psychiatric: No reported psychological or psychiatric signs or symptoms such as difficulty sleeping, anxiety, depression, delusions or hallucinations (schizophrenial), mood swings (bipolar disorders) or suicidal ideations or attempts Gastrointestinal: Reflux or heatburn Genitourinary: Kidney disease Hematological: Weakness due to low blood hemoglobin or red blood cell count (Anemia) and Bleeding easily Endocrine: Slow thyroid  Rheumatologic: No reported rheumatological signs and symptoms such as fatigue, joint pain, tenderness, swelling, redness, heat, stiffness, decreased range of motion, with or without associated rash Musculoskeletal: Negative for myasthenia gravis, muscular dystrophy, multiple sclerosis or malignant hyperthermia Work History: Disabled  Allergies  Tonya Myers is allergic to amoxapine, latex, ceftazidime, iodinated contrast media, and penicillins.  Laboratory Chemistry Profile   Renal Lab Results  Component Value Date   BUN 16 11/25/2023   CREATININE 3.14 (H) 11/25/2023   LABCREA 100 12/03/2020   GFRAA >60 01/17/2020   GFRNONAA 16 (L) 11/25/2023   PROTEINUR >=300 (A) 08/12/2023     Electrolytes Lab Results  Component Value Date   NA 136 11/25/2023   K 3.4 (L) 11/25/2023   CL 95 (L) 11/25/2023   CALCIUM  10.1 11/25/2023   MG 2.2 11/06/2023   PHOS 5.2 (H) 08/19/2023     Hepatic Lab Results  Component Value Date   AST 27 11/25/2023   ALT 7 11/25/2023   ALBUMIN  4.3 11/25/2023   ALKPHOS 89 11/25/2023   LIPASE 88 (H) 11/06/2023   AMMONIA 28 07/25/2023     ID Lab Results  Component Value Date   HIV Non Reactive 03/29/2023   SARSCOV2NAA NEGATIVE 07/25/2023   MRSAPCR NEGATIVE 06/11/2020   PREGTESTUR NEGATIVE 10/02/2020   RMSFIGG Negative 12/02/2020      Bone No results found for: CLARIS MATTOCKS, [id number], [id number], 25OHVITD1, 25OHVITD2, 25OHVITD3, TESTOFREE, TESTOSTERONE   Endocrine Lab Results  Component Value Date   GLUCOSE 93 11/25/2023   GLUCOSEU NEGATIVE 08/12/2023   HGBA1C 5.6 07/29/2023   TSH 3.030 11/25/2020     Neuropathy Lab Results  Component Value Date   VITAMINB12 628 07/15/2023   FOLATE 17.4 07/15/2023   HGBA1C 5.6 07/29/2023   HIV Non Reactive 03/29/2023     CNS Lab Results  Component Value Date   COLORCSF COLORLESS 11/20/2020   APPEARCSF CLEAR 11/20/2020   RBCCOUNTCSF 0 11/20/2020   WBCCSF 5 11/20/2020   POLYSCSF 0 11/20/2020   LYMPHSCSF 93 11/20/2020   EOSCSF 0 11/20/2020   PROTEINCSF 158 (H) 11/20/2020   GLUCCSF 110 (H) 11/20/2020   CSFOLI Comment 11/20/2020     Inflammation (CRP: Acute  ESR: Chronic) Lab Results  Component Value Date   CRP 0.6 12/12/2021   ESRSEDRATE 42 (H) 12/12/2021   LATICACIDVEN 1.4 12/12/2021     Rheumatology Lab Results  Component Value Date   ANA Positive (A) 12/03/2020   LABURIC 2.5 07/22/2020     Coagulation Lab Results  Component Value Date   INR 1.0 07/15/2023   LABPROT 13.9 07/15/2023   APTT 37 (H) 07/15/2023   PLT 123 (L) 11/25/2023     Cardiovascular Lab Results  Component Value Date   CKTOTAL 19 (L) 12/03/2020   HGB 11.1 (L) 11/25/2023   HCT 35.2 (L) 11/25/2023     Screening Lab Results  Component Value Date   SARSCOV2NAA NEGATIVE 07/25/2023   MRSAPCR NEGATIVE 06/11/2020   HIV Non Reactive 03/29/2023   PREGTESTUR NEGATIVE 10/02/2020     Cancer No results found for: CEA, CA125, LABCA2   Allergens No results found for: ALMOND, APPLE, ASPARAGUS, AVOCADO, BANANA, BARLEY, BASIL, BAYLEAF, GREENBEAN, LIMABEAN, WHITEBEAN, BEEFIGE, REDBEET, BLUEBERRY, BROCCOLI, CABBAGE, MELON, CARROT, CASEIN, CASHEWNUT, CAULIFLOWER, CELERY     Note: Lab results reviewed.  PFSH  Drug:  Tonya Myers  reports no history of drug use. Alcohol:  reports that she does not currently use alcohol after a past usage of about 1.0 standard drink of alcohol per week. Tobacco:  reports that she has never smoked. She has never used smokeless tobacco. Medical:  has a past medical history of Acute hemorrhoid (03/11/2015), Anxiety, Arthritis, Asthma, Cancer (HCC), Chronic back pain, CLL (chronic lymphocytic leukemia) (HCC), Depression, Diabetes mellitus without complication (HCC), Genital herpes, Hypertension, Hypothyroidism, and Vertigo. Family: family history includes Breast cancer (age of onset: 69) in her maternal aunt; Cancer in her paternal aunt.  Past Surgical History:  Procedure Laterality Date   ABDOMINAL HYSTERECTOMY     BILATERAL SALPINGOOPHORECTOMY  2009   BREAST BIOPSY Right 05/17/2016   FIBROADENOMATOUS CHANGE AND SCLEROSING ADENOSIS WITH   COLONOSCOPY WITH PROPOFOL  N/A 06/16/2015   Procedure: COLONOSCOPY WITH PROPOFOL ;  Surgeon: Donnice Vaughn Manes, MD;  Location: [facility] ENDOSCOPY;  Service: Endoscopy;  Laterality: N/A;   DIALYSIS/PERMA CATHETER INSERTION N/A 12/17/2020   Procedure: DIALYSIS/PERMA CATHETER INSERTION;  Surgeon: Marea Selinda RAMAN, MD;  Location: [facility] INVASIVE CV LAB;  Service: Cardiovascular;  Laterality: N/A;   LAPAROSCOPIC SUPRACERVICAL HYSTERECTOMY  2009   due to leio   OOPHORECTOMY     TEMPORARY DIALYSIS CATHETER N/A 12/10/2020   Procedure: TEMPORARY DIALYSIS CATHETER;  Surgeon: Jama Cordella MATSU, MD;  Location: [facility] INVASIVE CV LAB;  Service: Cardiovascular;  Laterality: N/A;   THORACIC LAMINECTOMY FOR EPIDURAL ABSCESS Bilateral 06/17/2020   Procedure: THORACIC LAMINECTOMY FOR EPIDURAL ABSCESS;  Surgeon: Bluford Standing, MD;  Location: [facility] ORS;  Service: Neurosurgery;  Laterality: Bilateral;   Active Ambulatory Problems    Diagnosis Date Noted   Personal history of CLL (chronic lymphocytic leukemia) 01/15/2015   Acute hemorrhoid 03/11/2015   DDD (degenerative disc  disease), cervical 07/29/2015   Cervical disc disorder with radiculopathy of cervical region 07/29/2015   Cervical facet syndrome 07/29/2015   DDD (degenerative disc disease), lumbar 07/29/2015   Facet syndrome, lumbar 07/29/2015   Bilateral occipital neuralgia 07/29/2015   Sacroiliac joint dysfunction 07/29/2015   DJD of shoulder 07/29/2015   Hypothyroidism 12/26/2014   Arthritis 12/26/2014   Carpal tunnel syndrome, bilateral 12/26/2014   Chronic right shoulder pain 03/23/2016   Controlled type 2 diabetes mellitus without complication, without long-term current use of insulin  (HCC) 12/26/2014   Essential hypertension 12/26/2014   Herpes simplex vulvovaginitis 05/29/2015   Hyperlipidemia, mixed 12/26/2014   Numbness and tingling 03/23/2016   Uncontrolled type 2 diabetes mellitus with hyperglycemia, without long-term current use of insulin  (HCC) 10/27/2015  Bacterial vaginosis 11/01/2016   Acute encephalopathy 06/09/2020   Hyperglycemic crisis in diabetes mellitus (HCC) 06/10/2020   Acute metabolic encephalopathy 06/10/2020   Severe sepsis (HCC)    Diabetic ketoacidosis without coma associated with type 2 diabetes mellitus (HCC)    AKI (acute kidney injury) (HCC)    Elevated LFTs    Pneumococcal meningitis    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HCC)    Epidural abscess    Hypernatremia    Hypomagnesemia    Obesity (BMI 30.0-34.9)    Bacterial spinal epidural abscess 06/27/2020   Meningitis    Labile blood glucose    Uncontrolled type 2 diabetes mellitus with hyperosmolar nonketotic hyperglycemia (HCC)    Transaminitis    Sleep disturbance    Anemia of chronic disease    Slow transit constipation    Urinary retention    Hypokalemia    Chronic pain of both knees    Pain    Left upper quadrant abdominal pain    Transient neurologic deficit 12/15/21 10/02/2020   Functional paraparesis 10/02/2020   Quadriplegia (HCC) 10/10/2020   Wheelchair dependence 10/10/2020   Right arm weakness  10/21/2020   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HCC) 12/10/2020   Thrombocytopenia (HCC) 12/11/2020   FUO (fever of unknown origin) 12/11/2020   Pancytopenia (HCC) 12/11/2020   Oral thrush 12/11/2020   Syncope 12/09/2021   Dyslipidemia 12/09/2021   Type 2 diabetes mellitus with peripheral neuropathy (HCC) 12/09/2021   GERD without esophagitis 12/09/2021   End-stage renal disease on hemodialysis (HCC) 12/09/2021   Elevated troponin 12/10/2021   Pressure injury of skin 12/11/2021   Sepsis (HCC) 12/12/2021   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   Closed fracture of sternum 12/12/2021   Chronic pancreatitis (HCC) 12/12/2021   Asthma 12/12/2021   Anemia in ESRD (end-stage renal disease) (HCC) 12/12/2021   Type II diabetes mellitus with renal manifestations (HCC) 12/12/2021   UTI (urinary tract infection) 12/12/2021   History of herpes genitalis 12/14/2021   Protein-calorie malnutrition, moderate (HCC) 12/17/2021   Sacral decubitus ulcer, stage IV (HCC) 12/23/2021   Dysarthria 03/28/2023   Uremia 03/28/2023   Stroke (HCC) 03/28/2023   Diarrhea 07/25/2023   Chest pain 07/30/2023   Near syncope 07/30/2023   ESRD (end stage renal disease) (HCC) 07/30/2023   Peripheral neuropathy 07/30/2023   Failure to thrive in adult/frailty 08/13/2023   Left patella fracture 08/13/2023   Resolved Ambulatory Problems    Diagnosis Date Noted   Aphasia 06/10/2020   Encephalitis 10/10/2020   Altered mental status 11/19/2020   Encephalopathy acute 11/20/2020   Subacute delirium 11/24/2020   Pancreatitis 12/11/2020   Past Medical History:  Diagnosis Date   Anxiety    Cancer (HCC)    Chronic back pain    CLL (chronic lymphocytic leukemia) (HCC)    Depression    Diabetes mellitus without complication (HCC)    Genital herpes    Hypertension    Vertigo    Constitutional Exam  General appearance: Well nourished, well developed, and well hydrated. In no apparent acute distress Vitals:   12/06/23 1007  BP: (!)  141/81  Pulse: 81  Resp: 16  Temp: 97.9 F (36.6 C)  TempSrc: Temporal  SpO2: 99%  Weight: 202 lb (91.6 kg)  Height: 6' (1.829 m)   BMI Assessment: Estimated body mass index is 27.4 kg/m as calculated from the following:   Height as of this encounter: 6' (1.829 m).   Weight as of this encounter: 202 lb (91.6 kg).  BMI interpretation table: BMI level  Category Range association with higher incidence of chronic pain  <18 kg/m2 Underweight   18.5-24.9 kg/m2 Ideal body weight   25-29.9 kg/m2 Overweight Increased incidence by 20%  30-34.9 kg/m2 Obese (Class I) Increased incidence by 68%  35-39.9 kg/m2 Severe obesity (Class II) Increased incidence by 136%  >40 kg/m2 Extreme obesity (Class III) Increased incidence by 254%   Patient's current BMI Ideal Body weight  Body mass index is 27.4 kg/m. Ideal body weight: 73.1 kg (161 lb 2.5 oz) Adjusted ideal body weight: 80.5 kg (177 lb 7.9 oz)   BMI Readings from Last 4 Encounters:  12/06/23 27.40 kg/m  11/29/23 27.40 kg/m  11/25/23 27.40 kg/m  11/06/23 27.67 kg/m   Wt Readings from Last 4 Encounters:  12/06/23 202 lb (91.6 kg)  11/29/23 202 lb (91.6 kg)  11/25/23 202 lb (91.6 kg)  11/06/23 204 lb (92.5 kg)    Psych/Mental status: Alert, oriented x 3 (person, place, & time)       Eyes: PERLA Respiratory: No evidence of acute respiratory distress  Cervical Spine Area Exam  Skin & Axial Inspection: No masses, redness, edema, swelling, or associated skin lesions Alignment: Symmetrical Functional ROM: Unrestricted ROM      Stability: No instability detected Muscle Tone/Strength: Functionally intact. No obvious neuro-muscular anomalies detected. Sensory (Neurological): Unimpaired Palpation: No palpable anomalies             Upper Extremity (UE) Exam    Side: Right upper extremity  Side: Left upper extremity  Skin & Extremity Inspection: Edema positive color changes of both hands  Skin & Extremity Inspection: Edema positive  color changes of both hands  Functional ROM: Unrestricted ROM          Functional ROM: Unrestricted ROM          Muscle Tone/Strength: Functionally intact. No obvious neuro-muscular anomalies detected.  Muscle Tone/Strength: Functionally intact. No obvious neuro-muscular anomalies detected.  Sensory (Neurological): Neurogenic pain pattern          Sensory (Neurological): Neurogenic pain pattern          Palpation: No palpable anomalies              Palpation: No palpable anomalies              Provocative Test(s):  Phalen's test: (+) for CTS Tinel's test: deferred Apley's scratch test (touch opposite shoulder):  Action 1 (Across chest): deferred Action 2 (Overhead): deferred Action 3 (LB reach): deferred   Provocative Test(s):  Phalen's test: (+) for CTS Tinel's test: deferred Apley's scratch test (touch opposite shoulder):  Action 1 (Across chest): deferred Action 2 (Overhead): deferred Action 3 (LB reach): deferred    Lumbar Spine Area Exam  Skin & Axial Inspection: No masses, redness, or swelling Alignment: Symmetrical Functional ROM: Unrestricted ROM       Stability: No instability detected Muscle Tone/Strength: Functionally intact. No obvious neuro-muscular anomalies detected. Sensory (Neurological): Unimpaired  Gait & Posture Assessment  Ambulation: Patient came in today in a wheel chair Gait: Relatively normal for age and body habitus Posture: WNL  Lower Extremity Exam    Side: Right lower extremity  Side: Left lower extremity  Stability: No instability observed          Stability: No instability observed          Skin & Extremity Inspection: Edema color changes of feet  Skin & Extremity Inspection: Edema color changes of feet  Functional ROM: Unrestricted ROM  Functional ROM: Unrestricted ROM                  Muscle Tone/Strength: Functionally intact. No obvious neuro-muscular anomalies detected.  Muscle Tone/Strength: Functionally intact. No obvious  neuro-muscular anomalies detected.  Sensory (Neurological): Neurogenic pain pattern        Sensory (Neurological): Neurogenic pain pattern        DTR: Patellar: deferred today Achilles: deferred today Plantar: deferred today  DTR: Patellar: deferred today Achilles: deferred today Plantar: deferred today  Palpation: No palpable anomalies  Palpation: No palpable anomalies    Assessment  Primary Diagnosis & Pertinent Problem List: The primary encounter diagnosis was Chronic painful diabetic neuropathy (HCC). Diagnoses of Paresthesia of lower extremity, Carpal tunnel syndrome, bilateral, Controlled type 2 diabetes mellitus without complication, without long-term current use of insulin  (HCC), End-stage renal disease on hemodialysis (HCC), and Chronic pain syndrome were also pertinent to this visit.  Visit Diagnosis (New problems to examiner): 1. Chronic painful diabetic neuropathy (HCC)   2. Paresthesia of lower extremity   3. Carpal tunnel syndrome, bilateral   4. Controlled type 2 diabetes mellitus without complication, without long-term current use of insulin  (HCC)   5. End-stage renal disease on hemodialysis (HCC)   6. Chronic pain syndrome    Plan of Care (Initial workup plan)  Assessment and Plan    Painful peripheral neuropathy of both feet and left hand   She experiences chronic burning, tingling, and spasms of pain in both feet and the left hand, which worsen post-dialysis. Diabetes contributes to her neuropathy. Gabapentin  was discontinued due to side effects, and tramadol  was ineffective. There are concerns about worsening neuropathy leading to falls and potential loss of ambulation. Although B12 deficiency is considered as a contributing factor, there is no current evidence to support B12 shots without deficiency. Increase amitriptyline  to 25 mg at bedtime for neuropathic pain and diabetic neuropathy.   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 The patient continues to have neuropathic pain despite adequate trials of first-line pharmacologic therapies, including gabapentin , pregabalin  (Lyrica ), and duloxetine  (Cymbalta ), with suboptimal pain control and/or intolerable side effects. Pain significantly affects function and quality of life. No open lesions, infection, or skin compromise in the affected areas.  Plan: Topical capsaicin  8% patch (Qutenza ) application to the affected areas of the feet in a controlled setting. Indication: FDA-approved for neuropathic pain associated with diabetic peripheral neuropathy of the feet. Rationale: Patient has failed multiple systemic neuropathic pain medications; Qutenza  offers a non-systemic option with minimal systemic drug interactions. Pre-procedure analgesia: Apply a topical anesthetic (e.g., 4% lidocaine  cream) to treatment area ~60 minutes before Qutenza  application to reduce application-related discomfort. Procedure details: Apply Qutenza  patches to each foot (cut to size as appropriate) for 30 minutes per FDA labeling for DPN.  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 Increasing amitriptyline  to 25 mg at bedtime may aid in sleep improvement.  Carpal tunnel syndrome of left >right hand   Chronic neuropathy, arthritis, and carpal tunnel syndrome affect her left hand. While there was previous improvement in right hand function, the left hand remains symptomatic. Offer a carpal tunnel injection with steroid for the left hand.      1. Chronic painful diabetic neuropathy (HCC) (Primary) - amitriptyline  (ELAVIL ) 25 MG tablet; Take 1 tablet (25 mg total) by mouth at bedtime.  Dispense: 30 tablet; Refill: 2 - NEUROLYSIS;  Future  2. Paresthesia of lower extremity - amitriptyline  (ELAVIL ) 25 MG tablet; Take 1 tablet (25 mg total) by mouth at bedtime.  Dispense: 30 tablet; Refill:  2  3. Carpal tunnel syndrome, bilateral - Therapeutic injection carpal tunnel; Future  4. Controlled type 2 diabetes mellitus without complication, without long-term current use of insulin  (HCC)  5. End-stage renal disease on hemodialysis (HCC)  6. Chronic pain syndrome - amitriptyline  (ELAVIL ) 25 MG tablet; Take 1 tablet (25 mg total) by mouth at bedtime.  Dispense: 30 tablet; Refill: 2 - NEUROLYSIS; Future - Therapeutic injection carpal tunnel; Future      Procedure Orders         NEUROLYSIS         Therapeutic injection carpal tunnel    Pharmacotherapy (current): Medications ordered:  Meds ordered this encounter  Medications   amitriptyline  (ELAVIL ) 25 MG tablet    Sig: Take 1 tablet (25 mg total) by mouth at bedtime.    Dispense:  30 tablet    Refill:  2   Medications administered during this visit: Tonya Myers had no medications administered during this visit.     Provider-requested follow-up: Return in about 9 days (around 12/15/2023) for Qutenza  .  Future Appointments  Date Time Provider Department Center  02/28/2024  9:45 AM [facility] None  02/28/2024 10:00 AM Finnegan, Evalene PARAS, MD [facility] None   I discussed the assessment and treatment plan with the patient. The patient was provided an opportunity to ask questions and all were answered. The patient agreed with the plan and demonstrated an understanding of the instructions.  Patient advised to call back or seek an in-person evaluation if the symptoms or condition worsens.  Duration of encounter: .  Total time on encounter, as per AMA guidelines included both the face-to-face and non-face-to-face time personally spent by the physician and/or other qualified health care professional(s) on the day of the encounter (includes time in activities that require the physician or other qualified health care professional and does not include time in activities normally performed by clinical staff).  Physician's time may include the following activities when performed: Preparing to see the patient (e.g., pre-charting review of records, searching for previously ordered imaging, lab work, and nerve conduction tests) Review of prior analgesic pharmacotherapies. Reviewing PMP Interpreting ordered tests (e.g., lab work, imaging, nerve conduction tests) Performing post-procedure evaluations, including interpretation of diagnostic procedures Obtaining and/or reviewing separately obtained history Performing a medically appropriate examination and/or evaluation Counseling and educating the patient/family/caregiver Ordering medications, tests, or procedures Referring and communicating with other health care professionals (when not separately reported) Documenting clinical information in the electronic or other health record Independently interpreting results (not separately reported) and communicating results to the patient/ family/caregiver Care coordination (not separately reported)  Note by: Tonya Sherry, MD (TTS and AI technology used. I apologize for any typographical errors that were not detected and corrected.) Date: 12/06/2023; Time: 11:00 AM

## 2023-12-10 ENCOUNTER — Inpatient Hospital Stay
Admission: EM | Admit: 2023-12-10 | Discharge: 2023-12-13 | DRG: 193 | Disposition: A | Discharge status: Home Health | Attending: Internal Medicine | Admitting: Internal Medicine

## 2023-12-10 ENCOUNTER — Emergency Department

## 2023-12-10 ENCOUNTER — Encounter: Payer: Self-pay | Admitting: Intensive Care

## 2023-12-10 ENCOUNTER — Other Ambulatory Visit: Payer: Self-pay

## 2023-12-10 DIAGNOSIS — J189 Pneumonia, unspecified organism: Secondary | ICD-10-CM | POA: Diagnosis not present

## 2023-12-10 DIAGNOSIS — K529 Noninfective gastroenteritis and colitis, unspecified: Secondary | ICD-10-CM | POA: Diagnosis present

## 2023-12-10 DIAGNOSIS — M25531 Pain in right wrist: Secondary | ICD-10-CM

## 2023-12-10 DIAGNOSIS — Z91040 Latex allergy status: Secondary | ICD-10-CM

## 2023-12-10 DIAGNOSIS — E039 Hypothyroidism, unspecified: Secondary | ICD-10-CM | POA: Diagnosis present

## 2023-12-10 DIAGNOSIS — Z5982 Transportation insecurity: Secondary | ICD-10-CM

## 2023-12-10 DIAGNOSIS — Z90710 Acquired absence of both cervix and uterus: Secondary | ICD-10-CM

## 2023-12-10 DIAGNOSIS — Y92008 Other place in unspecified non-institutional (private) residence as the place of occurrence of the external cause: Secondary | ICD-10-CM

## 2023-12-10 DIAGNOSIS — J180 Bronchopneumonia, unspecified organism: Secondary | ICD-10-CM | POA: Diagnosis not present

## 2023-12-10 DIAGNOSIS — Z7951 Long term (current) use of inhaled steroids: Secondary | ICD-10-CM

## 2023-12-10 DIAGNOSIS — E119 Type 2 diabetes mellitus without complications: Secondary | ICD-10-CM

## 2023-12-10 DIAGNOSIS — D631 Anemia in chronic kidney disease: Secondary | ICD-10-CM | POA: Diagnosis present

## 2023-12-10 DIAGNOSIS — N2581 Secondary hyperparathyroidism of renal origin: Secondary | ICD-10-CM | POA: Diagnosis present

## 2023-12-10 DIAGNOSIS — I10 Essential (primary) hypertension: Secondary | ICD-10-CM | POA: Diagnosis present

## 2023-12-10 DIAGNOSIS — Z880 Allergy status to penicillin: Secondary | ICD-10-CM

## 2023-12-10 DIAGNOSIS — D696 Thrombocytopenia, unspecified: Secondary | ICD-10-CM | POA: Diagnosis present

## 2023-12-10 DIAGNOSIS — G8250 Quadriplegia, unspecified: Secondary | ICD-10-CM | POA: Diagnosis present

## 2023-12-10 DIAGNOSIS — Z856 Personal history of leukemia: Secondary | ICD-10-CM

## 2023-12-10 DIAGNOSIS — R079 Chest pain, unspecified: Secondary | ICD-10-CM | POA: Diagnosis present

## 2023-12-10 DIAGNOSIS — I951 Orthostatic hypotension: Secondary | ICD-10-CM | POA: Diagnosis present

## 2023-12-10 DIAGNOSIS — C9111 Chronic lymphocytic leukemia of B-cell type in remission: Secondary | ICD-10-CM | POA: Diagnosis present

## 2023-12-10 DIAGNOSIS — Z1152 Encounter for screening for COVID-19: Secondary | ICD-10-CM

## 2023-12-10 DIAGNOSIS — Z79899 Other long term (current) drug therapy: Secondary | ICD-10-CM

## 2023-12-10 DIAGNOSIS — Z888 Allergy status to other drugs, medicaments and biological substances status: Secondary | ICD-10-CM

## 2023-12-10 DIAGNOSIS — N186 End stage renal disease: Secondary | ICD-10-CM | POA: Diagnosis present

## 2023-12-10 DIAGNOSIS — Z91041 Radiographic dye allergy status: Secondary | ICD-10-CM

## 2023-12-10 DIAGNOSIS — M109 Gout, unspecified: Secondary | ICD-10-CM | POA: Diagnosis present

## 2023-12-10 DIAGNOSIS — R54 Age-related physical debility: Secondary | ICD-10-CM | POA: Diagnosis present

## 2023-12-10 DIAGNOSIS — I639 Cerebral infarction, unspecified: Secondary | ICD-10-CM | POA: Diagnosis present

## 2023-12-10 DIAGNOSIS — Z5986 Financial insecurity: Secondary | ICD-10-CM

## 2023-12-10 DIAGNOSIS — W1839XA Other fall on same level, initial encounter: Secondary | ICD-10-CM | POA: Diagnosis present

## 2023-12-10 DIAGNOSIS — Z66 Do not resuscitate: Secondary | ICD-10-CM | POA: Diagnosis present

## 2023-12-10 DIAGNOSIS — E785 Hyperlipidemia, unspecified: Secondary | ICD-10-CM | POA: Diagnosis present

## 2023-12-10 DIAGNOSIS — Z803 Family history of malignant neoplasm of breast: Secondary | ICD-10-CM

## 2023-12-10 DIAGNOSIS — R627 Adult failure to thrive: Secondary | ICD-10-CM | POA: Diagnosis present

## 2023-12-10 DIAGNOSIS — D6959 Other secondary thrombocytopenia: Secondary | ICD-10-CM | POA: Diagnosis present

## 2023-12-10 DIAGNOSIS — E1122 Type 2 diabetes mellitus with diabetic chronic kidney disease: Secondary | ICD-10-CM | POA: Diagnosis present

## 2023-12-10 DIAGNOSIS — E1140 Type 2 diabetes mellitus with diabetic neuropathy, unspecified: Secondary | ICD-10-CM | POA: Diagnosis present

## 2023-12-10 DIAGNOSIS — Z8661 Personal history of infections of the central nervous system: Secondary | ICD-10-CM

## 2023-12-10 DIAGNOSIS — M25532 Pain in left wrist: Secondary | ICD-10-CM

## 2023-12-10 DIAGNOSIS — Z79890 Hormone replacement therapy (postmenopausal): Secondary | ICD-10-CM

## 2023-12-10 DIAGNOSIS — Z992 Dependence on renal dialysis: Secondary | ICD-10-CM

## 2023-12-10 DIAGNOSIS — I120 Hypertensive chronic kidney disease with stage 5 chronic kidney disease or end stage renal disease: Secondary | ICD-10-CM | POA: Diagnosis present

## 2023-12-10 HISTORY — DX: Disorder of kidney and ureter, unspecified: N28.9

## 2023-12-10 LAB — CBC WITH DIFFERENTIAL/PLATELET
Abs Immature Granulocytes: 0.04 K/uL (ref 0.00–0.07)
Basophils Absolute: 0 K/uL (ref 0.0–0.1)
Basophils Relative: 0 %
Eosinophils Absolute: 0.2 K/uL (ref 0.0–0.5)
Eosinophils Relative: 4 %
HCT: 25.7 % — ABNORMAL LOW (ref 36.0–46.0)
Hemoglobin: 9.4 g/dL — ABNORMAL LOW (ref 12.0–15.0)
Immature Granulocytes: 1 %
Lymphocytes Relative: 9 %
Lymphs Abs: 0.5 K/uL — ABNORMAL LOW (ref 0.7–4.0)
MCH: 36.2 pg — ABNORMAL HIGH (ref 26.0–34.0)
MCHC: 36.6 g/dL — ABNORMAL HIGH (ref 30.0–36.0)
MCV: 98.8 fL (ref 80.0–100.0)
Monocytes Absolute: 0.1 K/uL (ref 0.1–1.0)
Monocytes Relative: 2 %
Neutro Abs: 4.8 K/uL (ref 1.7–7.7)
Neutrophils Relative %: 84 %
Platelets: 78 K/uL — ABNORMAL LOW (ref 150–400)
RBC: 2.6 MIL/uL — ABNORMAL LOW (ref 3.87–5.11)
RDW: 19.5 % — ABNORMAL HIGH (ref 11.5–15.5)
WBC: 5.7 K/uL (ref 4.0–10.5)
nRBC: 0 % (ref 0.0–0.2)

## 2023-12-10 LAB — COMPREHENSIVE METABOLIC PANEL WITH GFR
ALT: 8 U/L (ref 0–44)
AST: 41 U/L (ref 15–41)
Albumin: 3.1 g/dL — ABNORMAL LOW (ref 3.5–5.0)
Alkaline Phosphatase: 60 U/L (ref 38–126)
Anion gap: 16 — ABNORMAL HIGH (ref 5–15)
BUN: 45 mg/dL — ABNORMAL HIGH (ref 6–20)
CO2: 25 mmol/L (ref 22–32)
Calcium: 8.5 mg/dL — ABNORMAL LOW (ref 8.9–10.3)
Chloride: 96 mmol/L — ABNORMAL LOW (ref 98–111)
Creatinine, Ser: 6.99 mg/dL — ABNORMAL HIGH (ref 0.44–1.00)
GFR, Estimated: 6 mL/min — ABNORMAL LOW (ref >60)
Glucose, Bld: 124 mg/dL — ABNORMAL HIGH (ref 70–99)
Potassium: 3.9 mmol/L (ref 3.5–5.1)
Sodium: 137 mmol/L (ref 135–145)
Total Bilirubin: 0.9 mg/dL (ref 0.0–1.2)
Total Protein: 7.3 g/dL (ref 6.5–8.1)

## 2023-12-10 LAB — URIC ACID: Uric Acid, Serum: 4.6 mg/dL (ref 2.5–7.1)

## 2023-12-10 LAB — TROPONIN I (HIGH SENSITIVITY)
Troponin I (High Sensitivity): 35 ng/L — ABNORMAL HIGH (ref <18)
Troponin I (High Sensitivity): 29 ng/L — ABNORMAL HIGH (ref <18)

## 2023-12-10 MED ORDER — TRAMADOL HCL 50 MG PO TABS
50.0000 mg | ORAL_TABLET | Freq: Two times a day (BID) | ORAL | Status: DC | PRN
  Administered 2023-12-11: 50 mg via ORAL
  Filled 2023-12-10: qty 1

## 2023-12-10 MED ORDER — INSULIN ASPART 100 UNIT/ML IJ SOLN
0.0000 [IU] | Freq: Every day | INTRAMUSCULAR | Status: DC
  Administered 2023-12-12: 3 [IU] via SUBCUTANEOUS
  Filled 2023-12-10: qty 1

## 2023-12-10 MED ORDER — SODIUM CHLORIDE 0.9 % IV SOLN
2.0000 g | INTRAVENOUS | Status: DC
  Administered 2023-12-11 – 2023-12-12 (×3): 2 g via INTRAVENOUS
  Filled 2023-12-10 (×3): qty 20

## 2023-12-10 MED ORDER — PANTOPRAZOLE SODIUM 40 MG PO TBEC
40.0000 mg | DELAYED_RELEASE_TABLET | Freq: Every day | ORAL | Status: DC
  Administered 2023-12-10 – 2023-12-13 (×4): 40 mg via ORAL
  Filled 2023-12-10 (×4): qty 1

## 2023-12-10 MED ORDER — ACETAMINOPHEN 650 MG RE SUPP: 650.0000 mg | Freq: Four times a day (QID) | RECTAL | Status: DC | PRN

## 2023-12-10 MED ORDER — DOXYCYCLINE HYCLATE 100 MG PO TABS
100.0000 mg | ORAL_TABLET | Freq: Once | ORAL | Status: AC
  Administered 2023-12-10: 100 mg via ORAL
  Filled 2023-12-10: qty 1

## 2023-12-10 MED ORDER — OXYCODONE HCL 5 MG PO TABS
5.0000 mg | ORAL_TABLET | Freq: Once | ORAL | Status: AC
  Administered 2023-12-10: 5 mg via ORAL
  Filled 2023-12-10: qty 1

## 2023-12-10 MED ORDER — CEFTRIAXONE SODIUM 1 G IJ SOLR
1.0000 g | Freq: Once | INTRAMUSCULAR | Status: AC
  Administered 2023-12-10: 1 g via INTRAMUSCULAR
  Filled 2023-12-10: qty 10

## 2023-12-10 MED ORDER — ONDANSETRON 4 MG PO TBDP: 8.0000 mg | ORAL_TABLET | Freq: Three times a day (TID) | ORAL | Status: DC | PRN

## 2023-12-10 MED ORDER — MIDODRINE HCL 5 MG PO TABS: 2.5000 mg | ORAL_TABLET | Freq: Two times a day (BID) | ORAL | Status: DC

## 2023-12-10 MED ORDER — ONDANSETRON HCL 4 MG/2ML IJ SOLN
4.0000 mg | Freq: Four times a day (QID) | INTRAMUSCULAR | Status: DC | PRN
Start: 2023-12-10 — End: 2023-12-13
  Administered 2023-12-11: 4 mg via INTRAVENOUS
  Filled 2023-12-10: qty 2

## 2023-12-10 MED ORDER — ACETAMINOPHEN 500 MG PO TABS
1000.0000 mg | ORAL_TABLET | Freq: Once | ORAL | Status: AC
  Administered 2023-12-10: 1000 mg via ORAL
  Filled 2023-12-10: qty 2

## 2023-12-10 MED ORDER — SODIUM CHLORIDE 0.9 % IV SOLN
500.0000 mg | INTRAVENOUS | Status: DC
  Administered 2023-12-11: 500 mg via INTRAVENOUS
  Filled 2023-12-10: qty 5

## 2023-12-10 MED ORDER — HYDROXYZINE HCL 50 MG PO TABS
25.0000 mg | ORAL_TABLET | Freq: Three times a day (TID) | ORAL | Status: DC | PRN
  Administered 2023-12-11 – 2023-12-12 (×3): 25 mg via ORAL
  Filled 2023-12-10 (×3): qty 1

## 2023-12-10 MED ORDER — CALCIUM ACETATE (PHOS BINDER) 667 MG PO CAPS: 1334.0000 mg | ORAL_CAPSULE | Freq: Three times a day (TID) | ORAL | Status: DC

## 2023-12-10 MED ORDER — HYDROMORPHONE HCL 1 MG/ML IJ SOLN
0.5000 mg | INTRAMUSCULAR | Status: DC | PRN
  Administered 2023-12-11: 0.5 mg via INTRAVENOUS
  Filled 2023-12-10: qty 0.5

## 2023-12-10 MED ORDER — ALBUTEROL SULFATE (2.5 MG/3ML) 0.083% IN NEBU: 2.5000 mg | INHALATION_SOLUTION | RESPIRATORY_TRACT | Status: DC | PRN

## 2023-12-10 MED ORDER — INSULIN ASPART 100 UNIT/ML IJ SOLN
0.0000 [IU] | Freq: Three times a day (TID) | INTRAMUSCULAR | Status: DC
  Administered 2023-12-12 – 2023-12-13 (×4): 1 [IU] via SUBCUTANEOUS
  Filled 2023-12-10 (×5): qty 1

## 2023-12-10 MED ORDER — GUAIFENESIN ER 600 MG PO TB12
600.0000 mg | ORAL_TABLET | Freq: Two times a day (BID) | ORAL | Status: DC
  Administered 2023-12-10 – 2023-12-13 (×6): 600 mg via ORAL
  Filled 2023-12-10 (×6): qty 1

## 2023-12-10 MED ORDER — HEPARIN SODIUM (PORCINE) 5000 UNIT/ML IJ SOLN
5000.0000 [IU] | Freq: Three times a day (TID) | INTRAMUSCULAR | Status: DC
  Administered 2023-12-10 – 2023-12-13 (×7): 5000 [IU] via SUBCUTANEOUS
  Filled 2023-12-10 (×8): qty 1

## 2023-12-10 MED ORDER — AMITRIPTYLINE HCL 25 MG PO TABS
25.0000 mg | ORAL_TABLET | Freq: Every day | ORAL | Status: DC
  Administered 2023-12-10 – 2023-12-12 (×3): 25 mg via ORAL
  Filled 2023-12-10: qty 0.5
  Filled 2023-12-10 (×3): qty 1

## 2023-12-10 MED ORDER — ACETAMINOPHEN 325 MG PO TABS: 650.0000 mg | ORAL_TABLET | Freq: Four times a day (QID) | ORAL | Status: DC | PRN

## 2023-12-10 MED ORDER — ATORVASTATIN CALCIUM 20 MG PO TABS
20.0000 mg | ORAL_TABLET | Freq: Every day | ORAL | Status: DC
  Administered 2023-12-10 – 2023-12-12 (×3): 20 mg via ORAL
  Filled 2023-12-10 (×3): qty 1

## 2023-12-10 MED ORDER — ONDANSETRON HCL 4 MG PO TABS
4.0000 mg | ORAL_TABLET | Freq: Four times a day (QID) | ORAL | Status: DC | PRN
Start: 2023-12-10 — End: 2023-12-13

## 2023-12-10 NOTE — H&P (Addendum)
 History and Physical    Patient: Tonya Myers FMW:[id number] DOB: 1965-06-15 DOA: 12/10/2023 DOS: the patient was seen and examined on 12/10/2023 PCP: Valora Agent, MD  Patient coming from: Home  Chief Complaint:  Chief Complaint  Patient presents with   Arm Pain   Pneumonia    HPI: Tonya Myers is a 58 y.o. 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  She presents with a 1 week history of congested cough and fevers, shortness of breath with chest pain on coughing and worsening of her baseline frailty causing her to fall onto outstretched arms about 3 days ago.  The fall occurred while she was ambulating with her walker to answer the door from a driver delivering cough medicine. Since the fall, she has had progressively worsening bilateral wrist pain, 9 out of 10, which impairs her ability to use her walker.  She feels like the wrists are swollen.    In the ED vitals within normal limits except for mild tachypnea.  Labs notable for normal WBC, baselineAnemia of 9.4 down from 11.1 a couple weeks prior, thrombocytopenia of 78,000 down from 1 23,000 and a couple weeks..  CMP unremarkable except for baseline renal function.  Uric acid 4.6 and troponin 29. Respiratory viral panel pending. EKG showing sinus at 87 with no acute ischemic changes Chest x-ray shows patchy left lower lobe bronchopneumonia X-ray of the  left forearm with no acute findings. Patient started on Rocephin  and doxycycline  Admission requested     Review of Systems: As mentioned in the history of present illness. All other systems reviewed and are negative.  Past Medical History:  Diagnosis Date   Acute hemorrhoid 03/11/2015   Anxiety    Arthritis    Asthma    Cancer (HCC)    Chronic back pain    CLL (chronic lymphocytic leukemia) (HCC)    Depression    Diabetes mellitus without complication (HCC)    Genital herpes    type 2   Hypertension    Hypothyroidism    Renal disorder    Vertigo    Past Surgical History:  Procedure Laterality Date   ABDOMINAL HYSTERECTOMY     BILATERAL SALPINGOOPHORECTOMY  2009   BREAST BIOPSY Right 05/17/2016   FIBROADENOMATOUS CHANGE AND SCLEROSING ADENOSIS WITH   COLONOSCOPY WITH PROPOFOL  N/A 06/16/2015   Procedure: COLONOSCOPY WITH PROPOFOL ;  Surgeon: Donnice Vaughn Manes, MD;  [facility] END[facility]COPY;  Service: Endoscopy;  Laterality: N/A;   DIALYSIS/PERMA CATHETER INSERTION N/A 12/17/2020   Procedure: DIALYSIS/PERMA CATHETER INSERTION;  Surgeon: Marea Selinda RAMAN, MD;  Location: [facility] INVASIVE CV LAB;  Service: Cardiovascular;  Laterality: N/A;   LAPAROSCOPIC SUPRACERVICAL HYSTERECTOMY  2009   due to leio   OOPHORECTOMY     TEMPORARY DIALYSIS CATHETER N/A 12/10/2020   Procedure: TEMPORARY DIALYSIS CATHETER;  Surgeon: Jama Cordella MATSU, MD;  Location: [facility] INVASIVE CV LAB;  Service: Cardiovascular;  Laterality: N/A;   THORACIC LAMINECTOMY FOR EPIDURAL ABSCESS Bilateral 06/17/2020   Procedure: THORACIC LAMINECTOMY FOR EPIDURAL ABSCESS;  Surgeon: Bluford Standing, MD;  Location: [facility] ORS;  Service: Neurosurgery;  Laterality:  Bilateral;   Social History:  reports that she has never smoked. She has never used smokeless tobacco. She reports that she does not currently use alcohol after a past usage of about 1.0 standard drink of alcohol per week. She reports that she does not use drugs.  Allergies   Allergen Reactions   Amoxapine     Other Reaction(s): Unknown   Latex    Ceftazidime     Other reaction(s): Confusion, Hallucination, Unknown Encephalopathy - improved after dialysis/some concern for cephalosporin neurotoxicity Tolerated without confusion 06/2021. Encephalopathy - improved after dialysis/some concern for cephalosporin neurotoxicity Tolerated without confusion 06/2021.    Iodinated Contrast Media Hives    Other Reaction(s): Hives   Penicillins     Other Reaction(s): Unknown    Family History  Problem Relation Age of Onset   Cancer Paternal Aunt    Breast cancer Maternal Aunt 14    Prior to Admission medications   Medication Sig Start Date End Date Taking? Authorizing Provider  acetaminophen  (TYLENOL ) 325 MG tablet Take 2 tablets (650 mg total) by mouth every 6 (six) hours as needed for mild pain (or Fever >/= 101). 07/30/20   Angiulli, Toribio PARAS, PA-C  albuterol  (VENTOLIN  HFA) 108 (90 Base) MCG/ACT inhaler Inhale 2 puffs into the lungs every 6 (six) hours as needed for wheezing or shortness of breath. 10/18/21   [provider]  amitriptyline  (ELAVIL ) 25 MG tablet Take 1 tablet (25 mg total) by mouth at bedtime. 12/06/23   Marcelino Nurse, MD  atorvastatin  (LIPITOR) 20 MG tablet Take 20 mg by mouth daily. 10/18/21   [provider]  Blood Glucose Monitoring Suppl DEVI 1 each by Does not apply route 3 (three) times daily. May dispense any manufacturer covered by patient's insurance. 08/19/23   Von Bellis, MD  calcium  acetate (PHOSLO ) 667 MG capsule Take 1,334 mg by mouth 3 (three) times daily with meals. 05/11/23 05/10/24  [provider]  cetirizine (ZYRTEC) 10 MG tablet Take 1 tablet by mouth daily. 07/07/23   [provider]  fluticasone  (FLONASE ) 50 MCG/ACT nasal spray Place 2 sprays into both nostrils daily.    [provider]  folic acid  (FOLVITE ) 1 MG tablet Take 1 tablet by mouth daily. 10/18/21   [provider]   Glucose Blood (BLOOD GLUCOSE TEST STRIPS) STRP 1 each by Does not apply route 3 (three) times daily. Use as directed to check blood sugar. May dispense any manufacturer covered by patient's insurance and fits patient's device. 08/19/23   Von Bellis, MD  hydrOXYzine  (ATARAX ) 25 MG tablet Take 1 tablet (25 mg total) by mouth 3 (three) times daily as needed for itching. 08/19/23   Von Bellis, MD  Lancet Device MISC 1 each by Does not apply route 3 (three) times daily. May dispense any manufacturer covered by patient's insurance. 08/19/23   Von Bellis, MD  Lancets MISC 1 each by Does not apply route 3 (three) times daily. Use as directed to check blood sugar. May dispense any manufacturer covered by patient's insurance and fits patient's device. 08/19/23   Von Bellis, MD  levothyroxine  (SYNTHROID ) 125 MCG tablet Take 125 mcg by mouth daily. 12/08/21   [provider]  lipase/protease/amylase (CREON ) 12000-38000 units CPEP capsule Take 1 capsule by mouth. 09/06/23   [provider]  midodrine  (PROAMATINE ) 2.5 MG tablet Take 2.5 mg by mouth. 11/24/23   [provider]  multivitamin (RENA-VIT) TABS tablet Take 1 tablet by mouth at bedtime. 12/22/21   Sreenath,  Sudheer B, MD  ondansetron  (ZOFRAN -ODT) 8 MG disintegrating tablet Take 1 tablet (8 mg total) by mouth every 8 (eight) hours as needed for nausea or vomiting. 11/04/23   Jacobo Evalene PARAS, MD  pantoprazole  (PROTONIX ) 40 MG tablet Take 1 tablet (40 mg total) by mouth 2 (two) times daily for 28 days, THEN 1 tablet (40 mg total) daily. 08/19/23 06/11/26  Von Bellis, MD  promethazine  (PHENERGAN ) 12.5 MG tablet Take 1 tablet (12.5 mg total) by mouth every 6 (six) hours as needed. 11/06/23   Willo Dunnings, MD  telmisartan  (MICARDIS ) 80 MG tablet Take 80 mg by mouth daily. Patient not taking: Reported on 12/06/2023 03/10/23 03/09/24  [provider]  traMADol  (ULTRAM ) 50 MG tablet Take 1 tablet (50 mg total) by mouth  every 8 (eight) hours as needed. Patient taking differently: Take 50 mg by mouth at bedtime. 09/01/23   Jacobo Evalene PARAS, MD  valACYclovir  (VALTREX ) 500 MG tablet Take 500 mg by mouth.    [provider]  zinc  sulfate 220 (50 Zn) MG capsule Take 1 capsule (220 mg total) by mouth daily. 12/23/21   Jhonny Calvin NOVAK, MD  budesonide-formoterol (SYMBICORT) 160-4.5 MCG/ACT inhaler Inhale into the lungs. 07/14/18 03/15/19  [provider]    Physical Exam: Vitals:   12/10/23 1600 12/10/23 1700 12/10/23 1911 12/10/23 1921  BP: 105/82   (!) 166/83  Pulse: 83   95  Resp: 19   (!) 22  Temp:  98.2 F (36.8 C)    TempSrc:  Oral    SpO2: 94%  100% 96%  Weight:      Height:       Physical Exam Vitals and nursing note reviewed.  Constitutional:      General: She is not in acute distress. HENT:     Head: Normocephalic and atraumatic.  Cardiovascular:     Rate and Rhythm: Normal rate and regular rhythm.     Heart sounds: Normal heart sounds.  Pulmonary:     Effort: Pulmonary effort is normal.     Breath sounds: Normal breath sounds.  Abdominal:     Palpations: Abdomen is soft.     Tenderness: There is no abdominal tenderness.  Musculoskeletal:     Comments: Pain with passive range of motion bilateral wrists  Neurological:     Mental Status: Mental status is at baseline.     Labs on Admission: I have personally reviewed following labs and imaging studies  CBC: Recent Labs  Lab 12/10/23 1404  WBC 5.7  NEUTROABS 4.8  HGB 9.4*  HCT 25.7*  MCV 98.8  PLT 78*   Basic Metabolic Panel: Recent Labs  Lab 12/10/23 1404  NA 137  K 3.9  CL 96*  CO2 25  GLUCOSE 124*  BUN 45*  CREATININE 6.99*  CALCIUM  8.5*   GFR: Estimated Creatinine Clearance: 10.9 mL/min (A) (by C-G formula based on SCr of 6.99 mg/dL (H)). Liver Function Tests: Recent Labs  Lab 12/10/23 1404  AST 41  ALT 8  ALKPHOS 60  BILITOT 0.9  PROT 7.3  ALBUMIN  3.1*   No results for input(s):  LIPASE, AMYLASE in the last 168 hours. No results for input(s): AMMONIA in the last 168 hours. Coagulation Profile: No results for input(s): INR, PROTIME in the last 168 hours. Cardiac Enzymes: No results for input(s): CKTOTAL, CKMB, CKMBINDEX, TROPONINI in the last 168 hours. BNP (last 3 results) No results for input(s): PROBNP in the last 8760 hours. HbA1C: No results for input(s):  HGBA1C in the last 72 hours. CBG: No results for input(s): GLUCAP in the last 168 hours. Lipid Profile: No results for input(s): CHOL, HDL, LDLCALC, TRIG, CHOLHDL, LDLDIRECT in the last 72 hours. Thyroid  Function Tests: No results for input(s): TSH, T4TOTAL, FREET4, T3FREE, THYROIDAB in the last 72 hours. Anemia Panel: No results for input(s): VITAMINB12, FOLATE, FERRITIN, TIBC, IRON, RETICCTPCT in the last 72 hours. Urine analysis:    Component Value Date/Time   COLORURINE AMBER (A) 08/12/2023 1834   APPEARANCEUR CLOUDY (A) 08/12/2023 1834   LABSPEC 1.014 08/12/2023 1834   PHURINE 8.0 08/12/2023 1834   GLUCOSEU NEGATIVE 08/12/2023 1834   HGBUR SMALL (A) 08/12/2023 1834   BILIRUBINUR NEGATIVE 08/12/2023 1834   KETONESUR NEGATIVE 08/12/2023 1834   PROTEINUR >=300 (A) 08/12/2023 1834   NITRITE NEGATIVE 08/12/2023 1834   LEUKOCYTESUR MODERATE (A) 08/12/2023 1834    Radiological Exams on Admission: DG Chest 1 View Result Date: 12/10/2023 CLINICAL DATA:  Cough EXAM: CHEST  1 VIEW COMPARISON:  07/29/2023 FINDINGS: 2 frontal views of the chest demonstrate an unremarkable cardiac silhouette. There is patchy airspace disease within the left lower lobe consistent with bronchopneumonia. No effusion or pneumothorax. No acute bony abnormalities. IMPRESSION: 1. Patchy left lower lobe bronchopneumonia. Electronically Signed   By: Ozell Daring M.D.   On: 12/10/2023 15:21   DG Forearm Left Result Date: 12/10/2023 CLINICAL DATA:  Forearm and wrist pain  EXAM: LEFT FOREARM - 2 VIEW COMPARISON:  None Available. FINDINGS: There is no evidence of fracture or other focal bone lesions. Soft tissues are unremarkable. IMPRESSION: Negative. Electronically Signed   By: Michaeline Blanch M.D.   On: 12/10/2023 13:53   Data Reviewed for HPI: Relevant notes from primary care and specialist visits, past discharge summaries as available in EHR, including Care Everywhere. Prior diagnostic testing as pertinent to current admission diagnoses Updated medications and problem lists for reconciliation ED course, including vitals, labs, imaging, treatment and response to treatment Triage notes, nursing and pharmacy notes and ED provider's notes Notable results as noted above in HPI      Assessment and Plan: * CAP (community acquired pneumonia) Presents with cough, shortness of breath, fevers at home, afebrile here, relative leukocytosis based on baseline leukopenia Chest x-ray with left lower lobe bronchopneumonia Rocephin  and azithromycin  Albuterol  as needed Antitussives  Bilateral wrist pain Fall onto outstretched arms 12/08/2023 Difficulty ambulating with walker due to wrist pain X-ray of left forearm unremarkable Uric acid normal We will get orthopedic evaluation, recommendations regarding need for further evaluation for occult injury Pain control   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) Pancytopenia, s/p bone marrow biopsy 07/2023(negative) New thrombocytopenia and anemia (compared to 11/25/2023) Last seen by oncology 11/25/23    Latest Ref Rng & Units 12/10/2023    2:04 PM 11/25/2023    9:52 AM 11/06/2023    1:02 AM  CBC  WBC 4.0 - 10.5 K/uL 5.7  3.7  3.5   Hemoglobin 12.0 - 15.0 g/dL 9.4  88.8  9.8   Hematocrit 36.0 - 46.0 % 25.7  35.2  31.5   Platelets 150 - 400 K/uL 78  123  80      Orthostatic hypotension History of hypertension Started on midodrine  in April Telmisartan  was placed on hold in April  Failure to thrive in  adult/frailty Peripheral neuropathy-ambulate with walker History of meningitis/epidural abscess s/p multilevel laminectomy PT OT consult  End-stage renal disease on hemodialysis [facility]) Nephrology consulted for continuation of dialysis  Chronic diarrhea No acute  issues Continue Pancreaze  and Protonix   Diet-controlled diabetes mellitus (HCC) Consistent carbohydrate diet with twice daily fingersticks    DVT prophylaxis: heparin   Consults: renal, ortho  Advance Care Planning:   Code Status: Limited: Do not attempt resuscitation (DNR) -DNR-LIMITED -Do Not Intubate/DNI    Family Communication: none  Disposition Plan: Back to previous home environment  Severity of Illness: The appropriate patient status for this patient is OBSERVATION. Observation status is judged to be reasonable and necessary in order to provide the required intensity of service to ensure the patient's safety. The patient's presenting symptoms, physical exam findings, and initial radiographic and laboratory data in the context of their medical condition is felt to place them at decreased risk for further clinical deterioration. Furthermore, it is anticipated that the patient will be medically stable for discharge from the hospital within 2 midnights of admission.   Author: Delayne LULLA Solian, MD 12/10/2023 8:14 PM  For on call review [email or web address].

## 2023-12-10 NOTE — Assessment & Plan Note (Signed)
 Diet controlled.

## 2023-12-10 NOTE — Assessment & Plan Note (Deleted)
 History of orthostatic hypotension Cautious continuation of home telmisartan  As needed midodrine 

## 2023-12-10 NOTE — Assessment & Plan Note (Signed)
 Consistent carbohydrate diet with twice daily fingersticks

## 2023-12-10 NOTE — Assessment & Plan Note (Signed)
 Nephrology consulted for continuation of dialysis ?

## 2023-12-10 NOTE — Assessment & Plan Note (Signed)
 History of hypertension Started on midodrine  in April Telmisartan  was placed on hold in April

## 2023-12-10 NOTE — ED Notes (Signed)
 RN to bedside to introduce self to pt. Pt is caox4 in no acute distress. I am familiar with patient. She is a dialysis patient and is difficult stick and will require an US  IV.

## 2023-12-10 NOTE — Assessment & Plan Note (Signed)
 No acute issues Continue Pancreaze  and Protonix 

## 2023-12-10 NOTE — Assessment & Plan Note (Addendum)
 Peripheral neuropathy-ambulate with walker History of meningitis/epidural abscess s/p multilevel laminectomy PT OT consult

## 2023-12-10 NOTE — ED Notes (Signed)
 RN to bedside to answer call bell. Pt assisted to bathroom and back to bed.

## 2023-12-10 NOTE — Assessment & Plan Note (Signed)
 No history of injury  Possibly tendinitis Pain control Can consider inpatient Ortho eval for recommendations

## 2023-12-10 NOTE — ED Triage Notes (Signed)
 Patient reports she had xray yesterday with UC and diagnosed with pneumonia. C/o discomfort with cough and mucous. Reports she has not had her prescriptions filled for yesterdays findings.   New arm pain started today from middle of left forearm down to hand. Denies injury.   Dialysis patient. Access on right arm

## 2023-12-10 NOTE — ED Notes (Signed)
 Lab called to send phlebotomist.

## 2023-12-10 NOTE — ED Provider Notes (Signed)
 [facility] Provider Note    Event Date/Time   First MD Initiated Contact with Patient 12/10/23 1456     (approximate)   History   Arm Pain and Pneumonia   HPI  Tonya Myers is a 58 y.o. female history of ESRD on dialysis, anxiety, arthritis, CLL, presenting with cough as well as left wrist pain.  States that her cough started several days ago, associated with chest discomfort as well as shortness of breath.  States no shortness of breath or chest pain at this time.  Also with a sore throat.  Had intermittent fever at home. States that she went to urgent care and was diagnosed with pneumonia, got prescribed doxycycline  but was not able to pick it up.  States no recent trauma or falls, states that she feels pain to her lateral hand and lateral wrist.  Denies any redness, no numbness tingling.  No swelling.  On independent review, she was seen by pain medicine in August, has history of ESRD on dialysis, does experience burning, tingling, spasms of both her hands and feet especially after dialysis session.  Also has history of arthritis and carpal tunnel syndrome.  Physical Exam   Triage Vital Signs: ED Triage Vitals  Encounter Vitals Group     BP 12/10/23 1307 (!) 145/88     Girls Systolic BP Percentile --      Girls Diastolic BP Percentile --      Boys Systolic BP Percentile --      Boys Diastolic BP Percentile --      Pulse Rate 12/10/23 1307 88     Resp 12/10/23 1307 17     Temp 12/10/23 1307 99.2 F (37.3 C)     Temp Source 12/10/23 1307 Oral     SpO2 12/10/23 1307 98 %     Weight 12/10/23 1319 202 lb (91.6 kg)     Height 12/10/23 1319 6' (1.829 m)     Head Circumference --      Peak Flow --      Pain Score 12/10/23 1319 10     Pain Loc --      Pain Education --      Exclude from Growth Chart --     Most recent vital signs: Vitals:   12/10/23 1911 12/10/23 1921  BP:  (!) 166/83  Pulse:  95  Resp:  (!) 22  Temp:    SpO2: 100%  96%     General: Awake, no distress.  CV:  Good peripheral perfusion.  Resp:  Normal effort.  No tachypnea or respiratory distress Abd:  No distention.  Soft nontender Other:  Able to fully range her left wrist, elbow, hand.  She does have tenderness along the lateral first metacarpal and lateral wrist.  She has no focal weakness or numbness, palpable radial pulses bilaterally, no focal weakness or numbness, no swelling or erythema, no open wounds.  Clear oropharynx   ED Results / Procedures / Treatments   Labs (all labs ordered are listed, but only abnormal results are displayed) Labs Reviewed  CBC WITH DIFFERENTIAL/PLATELET - Abnormal; Notable for the following components:      Result Value   RBC 2.60 (*)    Hemoglobin 9.4 (*)    HCT 25.7 (*)    MCH 36.2 (*)    MCHC 36.6 (*)    RDW 19.5 (*)    Platelets 78 (*)    Lymphs Abs 0.5 (*)    All  other components within normal limits  COMPREHENSIVE METABOLIC PANEL WITH GFR - Abnormal; Notable for the following components:   Chloride 96 (*)    Glucose, Bld 124 (*)    BUN 45 (*)    Creatinine, Ser 6.99 (*)    Calcium  8.5 (*)    Albumin  3.1 (*)    GFR, Estimated 6 (*)    Anion gap 16 (*)    All other components within normal limits  TROPONIN I (HIGH SENSITIVITY) - Abnormal; Notable for the following components:   Troponin I (High Sensitivity) 35 (*)    All other components within normal limits  TROPONIN I (HIGH SENSITIVITY) - Abnormal; Notable for the following components:   Troponin I (High Sensitivity) 29 (*)    All other components within normal limits  URIC ACID     EKG  EKG shows, sinus rhythm, rate 87, normal QRS, prolonged QTc, no obvious ischemic ST elevation, baseline is wandering, T wave flattening in 3, V3, T wave inversions seen on prior EKGs not present on current EKG this is changed compared to prior   RADIOLOGY On my independent interpretation, chest x-ray shows opacity on the  left   PROCEDURES:  Critical Care performed: No  Procedures   MEDICATIONS ORDERED IN ED: Medications  acetaminophen  (TYLENOL ) tablet 1,000 mg (1,000 mg Oral Given 12/10/23 1557)  cefTRIAXone  (ROCEPHIN ) injection 1 g (1 g Intramuscular Given 12/10/23 1557)  doxycycline  (VIBRA -TABS) tablet 100 mg (100 mg Oral Given 12/10/23 1557)  oxyCODONE  (Oxy IR/ROXICODONE ) immediate release tablet 5 mg (5 mg Oral Given 12/10/23 1917)     IMPRESSION / MDM / ASSESSMENT AND PLAN / ED COURSE  I reviewed the triage vital signs and the nursing notes.                              Differential diagnosis includes, but is not limited to, arthritis, tendinopathy, strain, sprain, considered but doubt fracture since no trauma or falls, doubt septic joint since able to range her wrist, no redness, warmth, swelling, patient does not appear volume overloaded at this time, is not hypoxic, not complaining of shortness of breath, doubt volume overload, do suspect viral illness or pneumonia, also considered ACS.  Forearm and wrist x-ray was obtained out of triage including labs, will add on chest x-ray, EKG, troponin.  Patient's presentation is most consistent with acute presentation with potential threat to life or bodily function.  Independent interpretation of labs and imaging below.  Chest x-ray consistent with left-sided pneumonia, will give her dose of ceftriaxone  as well as doxycycline  here.  She is tolerated ceftriaxone  in the past.  Patient was monitored in the emergency department, got her up to get an ambulatory sat, she was satting 100% but would feel very lightheaded and short of breath.  States that she lives at home alone and no additional help at home.  Will plan to bring her into the hospital for further management of her pneumonia.  Consulted hospitalist was agreeable with the plan for admission and will evaluate the patient.  She is admitted.  The patient is on the cardiac monitor to evaluate for evidence  of arrhythmia and/or significant heart rate changes.   Clinical Course as of 12/10/23 1950  Sat Dec 10, 2023  1527 DG Chest 1 View 1. Patchy left lower lobe bronchopneumonia.  [TT]  1527 DG Forearm Left Negative.  [TT]  1527 Independent review of labs, no leukocytosis, troponins mildly  elevated, uric acid is normal, electrolytes really deranged, creatinine is elevated but she has history of ESRD on dialysis. [TT]  1847 Troponin I (High Sensitivity)(!) Downtrending [TT]    Clinical Course User Index [TT] Waymond Lorelle Cummins, MD     FINAL CLINICAL IMPRESSION(S) / ED DIAGNOSES   Final diagnoses:  Pneumonia of left lung due to infectious organism, unspecified part of lung  Left wrist pain  ESRD on dialysis [facility])     Rx / DC Orders   ED Discharge Orders     None        Note:  This document was prepared using Dragon voice recognition software and may include unintentional dictation errors.    Waymond Lorelle Cummins, MD 12/10/23 [phone]

## 2023-12-10 NOTE — Assessment & Plan Note (Signed)
 Presents with cough, shortness of breath, fevers at home, afebrile here, relative leukocytosis based on baseline leukopenia Chest x-ray with left lower lobe bronchopneumonia Rocephin  and azithromycin  Albuterol  as needed Antitussives

## 2023-12-10 NOTE — Assessment & Plan Note (Addendum)
 Pancytopenia, s/p bone marrow biopsy 07/2023(negative) New thrombocytopenia and anemia (compared to 11/25/2023) Last seen by oncology 11/25/23    Latest Ref Rng & Units 12/10/2023    2:04 PM 11/25/2023    9:52 AM 11/06/2023    1:02 AM  CBC  WBC 4.0 - 10.5 K/uL 5.7  3.7  3.5   Hemoglobin 12.0 - 15.0 g/dL 9.4  88.8  9.8   Hematocrit 36.0 - 46.0 % 25.7  35.2  31.5   Platelets 150 - 400 K/uL 78  123  80

## 2023-12-11 ENCOUNTER — Observation Stay

## 2023-12-11 DIAGNOSIS — I951 Orthostatic hypotension: Secondary | ICD-10-CM | POA: Diagnosis present

## 2023-12-11 DIAGNOSIS — E785 Hyperlipidemia, unspecified: Secondary | ICD-10-CM | POA: Diagnosis present

## 2023-12-11 DIAGNOSIS — Z91041 Radiographic dye allergy status: Secondary | ICD-10-CM | POA: Diagnosis not present

## 2023-12-11 DIAGNOSIS — N186 End stage renal disease: Secondary | ICD-10-CM | POA: Diagnosis present

## 2023-12-11 DIAGNOSIS — J189 Pneumonia, unspecified organism: Secondary | ICD-10-CM | POA: Diagnosis present

## 2023-12-11 DIAGNOSIS — R627 Adult failure to thrive: Secondary | ICD-10-CM | POA: Diagnosis present

## 2023-12-11 DIAGNOSIS — Z1152 Encounter for screening for COVID-19: Secondary | ICD-10-CM | POA: Diagnosis not present

## 2023-12-11 DIAGNOSIS — D631 Anemia in chronic kidney disease: Secondary | ICD-10-CM | POA: Diagnosis present

## 2023-12-11 DIAGNOSIS — C9111 Chronic lymphocytic leukemia of B-cell type in remission: Secondary | ICD-10-CM | POA: Diagnosis present

## 2023-12-11 DIAGNOSIS — Z79890 Hormone replacement therapy (postmenopausal): Secondary | ICD-10-CM | POA: Diagnosis not present

## 2023-12-11 DIAGNOSIS — Z66 Do not resuscitate: Secondary | ICD-10-CM | POA: Diagnosis present

## 2023-12-11 DIAGNOSIS — M109 Gout, unspecified: Secondary | ICD-10-CM | POA: Diagnosis present

## 2023-12-11 DIAGNOSIS — Z91040 Latex allergy status: Secondary | ICD-10-CM | POA: Diagnosis not present

## 2023-12-11 DIAGNOSIS — E039 Hypothyroidism, unspecified: Secondary | ICD-10-CM | POA: Diagnosis present

## 2023-12-11 DIAGNOSIS — M25531 Pain in right wrist: Secondary | ICD-10-CM | POA: Diagnosis not present

## 2023-12-11 DIAGNOSIS — Z880 Allergy status to penicillin: Secondary | ICD-10-CM | POA: Diagnosis not present

## 2023-12-11 DIAGNOSIS — N2581 Secondary hyperparathyroidism of renal origin: Secondary | ICD-10-CM | POA: Diagnosis present

## 2023-12-11 DIAGNOSIS — Z5982 Transportation insecurity: Secondary | ICD-10-CM | POA: Diagnosis not present

## 2023-12-11 DIAGNOSIS — D6959 Other secondary thrombocytopenia: Secondary | ICD-10-CM | POA: Diagnosis present

## 2023-12-11 DIAGNOSIS — I120 Hypertensive chronic kidney disease with stage 5 chronic kidney disease or end stage renal disease: Secondary | ICD-10-CM | POA: Diagnosis present

## 2023-12-11 DIAGNOSIS — E1122 Type 2 diabetes mellitus with diabetic chronic kidney disease: Secondary | ICD-10-CM | POA: Diagnosis present

## 2023-12-11 DIAGNOSIS — W1839XA Other fall on same level, initial encounter: Secondary | ICD-10-CM | POA: Diagnosis present

## 2023-12-11 DIAGNOSIS — Y92008 Other place in unspecified non-institutional (private) residence as the place of occurrence of the external cause: Secondary | ICD-10-CM | POA: Diagnosis not present

## 2023-12-11 DIAGNOSIS — Z992 Dependence on renal dialysis: Secondary | ICD-10-CM | POA: Diagnosis not present

## 2023-12-11 DIAGNOSIS — E1140 Type 2 diabetes mellitus with diabetic neuropathy, unspecified: Secondary | ICD-10-CM | POA: Diagnosis present

## 2023-12-11 DIAGNOSIS — J180 Bronchopneumonia, unspecified organism: Secondary | ICD-10-CM | POA: Diagnosis present

## 2023-12-11 DIAGNOSIS — Z888 Allergy status to other drugs, medicaments and biological substances status: Secondary | ICD-10-CM | POA: Diagnosis not present

## 2023-12-11 DIAGNOSIS — Z7951 Long term (current) use of inhaled steroids: Secondary | ICD-10-CM | POA: Diagnosis not present

## 2023-12-11 LAB — STREP PNEUMONIAE URINARY ANTIGEN: Strep Pneumo Urinary Antigen: NEGATIVE

## 2023-12-11 LAB — RESPIRATORY PANEL BY PCR

## 2023-12-11 LAB — GLUCOSE, CAPILLARY
Glucose-Capillary: 102 mg/dL — ABNORMAL HIGH (ref 70–99)
Glucose-Capillary: 84 mg/dL (ref 70–99)
Glucose-Capillary: 118 mg/dL — ABNORMAL HIGH (ref 70–99)
Glucose-Capillary: 122 mg/dL — ABNORMAL HIGH (ref 70–99)

## 2023-12-11 LAB — CBC
HCT: 28.1 % — ABNORMAL LOW (ref 36.0–46.0)
Hemoglobin: 8.8 g/dL — ABNORMAL LOW (ref 12.0–15.0)
MCH: 29.2 pg (ref 26.0–34.0)
MCHC: 31.3 g/dL (ref 30.0–36.0)
MCV: 93.4 fL (ref 80.0–100.0)
Platelets: 70 K/uL — ABNORMAL LOW (ref 150–400)
RBC: 3.01 MIL/uL — ABNORMAL LOW (ref 3.87–5.11)
RDW: 17.2 % — ABNORMAL HIGH (ref 11.5–15.5)
WBC: 3.8 K/uL — ABNORMAL LOW (ref 4.0–10.5)
nRBC: 0 % (ref 0.0–0.2)

## 2023-12-11 LAB — D-DIMER, QUANTITATIVE: D-Dimer, Quant: 4.87 ug{FEU}/mL — ABNORMAL HIGH (ref 0.00–0.50)

## 2023-12-11 LAB — C-REACTIVE PROTEIN: CRP: 9.4 mg/dL — ABNORMAL HIGH (ref <1.0)

## 2023-12-11 LAB — SARS CORONAVIRUS 2 BY RT PCR: SARS Coronavirus 2 by RT PCR: NEGATIVE

## 2023-12-11 MED ORDER — HYDROCHLOROTHIAZIDE 12.5 MG PO TABS
12.5000 mg | ORAL_TABLET | Freq: Every day | ORAL | Status: DC
  Administered 2023-12-11: 12.5 mg via ORAL
  Filled 2023-12-11 (×2): qty 1

## 2023-12-11 MED ORDER — OXYCODONE HCL 5 MG PO TABS
5.0000 mg | ORAL_TABLET | Freq: Four times a day (QID) | ORAL | Status: DC | PRN
  Administered 2023-12-11 (×2): 5 mg via ORAL
  Filled 2023-12-11 (×2): qty 1

## 2023-12-11 MED ORDER — HYDROMORPHONE HCL 1 MG/ML IJ SOLN
1.0000 mg | INTRAMUSCULAR | Status: DC | PRN
  Administered 2023-12-11: 1 mg via INTRAVENOUS
  Filled 2023-12-11: qty 1

## 2023-12-11 MED ORDER — HYDROMORPHONE HCL 1 MG/ML IJ SOLN
1.0000 mg | INTRAMUSCULAR | Status: DC | PRN
  Administered 2023-12-11 – 2023-12-12 (×3): 1 mg via INTRAVENOUS
  Filled 2023-12-11 (×3): qty 1

## 2023-12-11 MED ORDER — AZITHROMYCIN 500 MG PO TABS
500.0000 mg | ORAL_TABLET | Freq: Every day | ORAL | Status: DC
  Administered 2023-12-11 – 2023-12-12 (×2): 500 mg via ORAL
  Filled 2023-12-11 (×2): qty 1

## 2023-12-11 MED ORDER — PREDNISONE 20 MG PO TABS
40.0000 mg | ORAL_TABLET | Freq: Every day | ORAL | Status: DC
  Administered 2023-12-11 – 2023-12-13 (×3): 40 mg via ORAL
  Filled 2023-12-11 (×3): qty 2

## 2023-12-11 MED ORDER — LEVOTHYROXINE SODIUM 25 MCG PO TABS: 125.0000 ug | ORAL_TABLET | Freq: Every day | ORAL | Status: DC

## 2023-12-11 MED ORDER — CHLORHEXIDINE GLUCONATE CLOTH 2 % EX PADS
6.0000 | MEDICATED_PAD | Freq: Every day | CUTANEOUS | Status: DC
  Administered 2023-12-12 – 2023-12-13 (×2): 6 via TOPICAL

## 2023-12-11 MED ORDER — HYDROMORPHONE HCL 1 MG/ML IJ SOLN
0.5000 mg | Freq: Once | INTRAMUSCULAR | Status: AC
  Administered 2023-12-11: 0.5 mg via INTRAVENOUS
  Filled 2023-12-11: qty 0.5

## 2023-12-11 MED ORDER — LEVOTHYROXINE SODIUM 50 MCG PO TABS
187.5000 ug | ORAL_TABLET | ORAL | Status: DC
  Administered 2023-12-11: 187.5 ug via ORAL
  Filled 2023-12-11: qty 2

## 2023-12-11 MED ORDER — LEVOTHYROXINE SODIUM 25 MCG PO TABS
125.0000 ug | ORAL_TABLET | ORAL | Status: DC
  Administered 2023-12-12 – 2023-12-13 (×2): 125 ug via ORAL
  Filled 2023-12-11 (×2): qty 1

## 2023-12-11 NOTE — Progress Notes (Signed)
 Orthopedic Tech Progress Note Patient Details:  Tonya Myers [birth year] [id number]  Routine order for bilateral resting hand splints have been called into [facility] as the current wrist brace is not helping to alleviate her pain.  Patient ID: Tonya Myers, female   DOB: 1966-02-01, 58 y.o.   MRN: [id number]  Tonya Myers 12/11/2023, 6:11 PM

## 2023-12-11 NOTE — Progress Notes (Signed)
 Referring Provider: No ref. provider found Primary Care Physician:  Valora Agent, MD Primary Nephrologist:  Dr.   [place] for Consultation: ESRD  HPI: 58 year old female with a past medical history of end-stage renal disease on hemodialysis Monday Wednesday Friday schedule, ANCA related glomerulonephritis, diet-controlled diabetes, orthostatic hypotension and thrombocytopenia and CLL now admitted with history of chest congestion, cough and shortness of breath.  She is now being treated with antibiotics for pneumonia.  Past Medical History:  Diagnosis Date   Acute hemorrhoid 03/11/2015   Anxiety    Arthritis    Asthma    Cancer (HCC)    Chronic back pain    CLL (chronic lymphocytic leukemia) (HCC)    Depression    Diabetes mellitus without complication (HCC)    Genital herpes    type 2   Hypertension    Hypothyroidism    Renal disorder    Vertigo     Past Surgical History:  Procedure Laterality Date   ABDOMINAL HYSTERECTOMY     BILATERAL SALPINGOOPHORECTOMY  2009   BREAST BIOPSY Right 05/17/2016   FIBROADENOMATOUS CHANGE AND SCLEROSING ADENOSIS WITH   COLONOSCOPY WITH PROPOFOL  N/A 06/16/2015   Procedure: COLONOSCOPY WITH PROPOFOL ;  Surgeon: Donnice Vaughn Manes, MD;  Location: [facility] ENDOSCOPY;  Service: Endoscopy;  Laterality: N/A;   DIALYSIS/PERMA CATHETER INSERTION N/A 12/17/2020   Procedure: DIALYSIS/PERMA CATHETER INSERTION;  Surgeon: Marea Selinda RAMAN, MD;  Location: [facility] INVASIVE CV LAB;  Service: Cardiovascular;  Laterality: N/A;   LAPAROSCOPIC SUPRACERVICAL HYSTERECTOMY  2009   due to leio   OOPHORECTOMY     TEMPORARY DIALYSIS CATHETER N/A 12/10/2020   Procedure: TEMPORARY DIALYSIS CATHETER;  Surgeon: Jama Cordella MATSU, MD;  Location: [facility] INVASIVE CV LAB;  Service: Cardiovascular;  Laterality: N/A;   THORACIC LAMINECTOMY FOR EPIDURAL ABSCESS Bilateral 06/17/2020   Procedure: THORACIC LAMINECTOMY FOR EPIDURAL ABSCESS;  Surgeon: Bluford Standing, MD;  Location: [facility] ORS;  Service:  Neurosurgery;  Laterality: Bilateral;    Prior to Admission medications   Medication Sig Start Date End Date Taking? Authorizing Provider  acetaminophen  (TYLENOL ) 325 MG tablet Take 2 tablets (650 mg total) by mouth every 6 (six) hours as needed for mild pain (or Fever >/= 101). 07/30/20  Yes Angiulli, Toribio PARAS, PA-C  albuterol  (VENTOLIN  HFA) 108 (90 Base) MCG/ACT inhaler Inhale 2 puffs into the lungs every 6 (six) hours as needed for wheezing or shortness of breath. 10/18/21  Yes [provider]  amitriptyline  (ELAVIL ) 25 MG tablet Take 1 tablet (25 mg total) by mouth at bedtime. 12/06/23  Yes Marcelino Nurse, MD  atorvastatin  (LIPITOR) 20 MG tablet Take 20 mg by mouth daily. 10/18/21  Yes [provider]  cetirizine (ZYRTEC) 10 MG tablet Take 1 tablet by mouth daily. 07/07/23  Yes [provider]  fluticasone  (FLONASE ) 50 MCG/ACT nasal spray Place 2 sprays into both nostrils daily.   Yes [provider]  folic acid  (FOLVITE ) 1 MG tablet Take 1 tablet by mouth daily. 10/18/21  Yes [provider]  hydrOXYzine  (ATARAX ) 25 MG tablet Take 1 tablet (25 mg total) by mouth 3 (three) times daily as needed for itching. 08/19/23  Yes Von Bellis, MD  levothyroxine  (SYNTHROID ) 125 MCG tablet Take 125 mcg by mouth daily. Take 1 tablet daily except Sunday take 1 and 1/2 tablet (187.5 mg). 12/08/21  Yes [provider]  lipase/protease/amylase (CREON ) 12000-38000 units CPEP capsule Take 1 capsule by mouth. 09/06/23  Yes [provider]  midodrine  (PROAMATINE ) 2.5 MG tablet Take 2.5  mg by mouth as directed. Only on dialysis days. (Monday Wednesday and Friday) 11/24/23  Yes [provider]  multivitamin (RENA-VIT) TABS tablet Take 1 tablet by mouth at bedtime. 12/22/21  Yes Jhonny Calvin NOVAK, MD  ondansetron  (ZOFRAN -ODT) 8 MG disintegrating tablet Take 1 tablet (8 mg total) by mouth every 8 (eight) hours as needed for nausea or vomiting. 11/04/23  Yes  Jacobo Evalene PARAS, MD  pantoprazole  (PROTONIX ) 40 MG tablet Take 1 tablet (40 mg total) by mouth 2 (two) times daily for 28 days, THEN 1 tablet (40 mg total) daily. 08/19/23 06/11/26 Yes Von Bellis, MD  promethazine  (PHENERGAN ) 12.5 MG tablet Take 1 tablet (12.5 mg total) by mouth every 6 (six) hours as needed. 11/06/23  Yes Willo Dunnings, MD  traMADol  (ULTRAM ) 50 MG tablet Take 1 tablet (50 mg total) by mouth every 8 (eight) hours as needed. Patient taking differently: Take 50 mg by mouth at bedtime. 09/01/23  Yes Jacobo Evalene PARAS, MD  zinc  sulfate 220 (50 Zn) MG capsule Take 1 capsule (220 mg total) by mouth daily. 12/23/21  Yes Sreenath, Sudheer B, MD  Blood Glucose Monitoring Suppl DEVI 1 each by Does not apply route 3 (three) times daily. May dispense any manufacturer covered by patient's insurance. 08/19/23   Von Bellis, MD  calcium  acetate (PHOSLO ) 667 MG capsule Take 1,334 mg by mouth 3 (three) times daily with meals. Patient not taking: Reported on 12/10/2023 05/11/23 05/10/24  [provider]  Glucose Blood (BLOOD GLUCOSE TEST STRIPS) STRP 1 each by Does not apply route 3 (three) times daily. Use as directed to check blood sugar. May dispense any manufacturer covered by patient's insurance and fits patient's device. 08/19/23   Von Bellis, MD  Lancet Device MISC 1 each by Does not apply route 3 (three) times daily. May dispense any manufacturer covered by patient's insurance. 08/19/23   Von Bellis, MD  Lancets MISC 1 each by Does not apply route 3 (three) times daily. Use as directed to check blood sugar. May dispense any manufacturer covered by patient's insurance and fits patient's device. 08/19/23   Von Bellis, MD  valACYclovir  (VALTREX ) 500 MG tablet Take 500 mg by mouth as directed. Take 1 tablet daily on dialysis days.(Monday, Wednesday and Friday)    [provider]  budesonide-formoterol (SYMBICORT) 160-4.5 MCG/ACT inhaler Inhale into the lungs. 07/14/18  03/15/19  [provider]    Current Facility-Administered Medications  Medication Dose Route Frequency Provider Last Rate Last Admin   acetaminophen  (TYLENOL ) tablet 650 mg  650 mg Oral Q6H PRN Duncan, Hazel V, MD       Or   acetaminophen  (TYLENOL ) suppository 650 mg  650 mg Rectal Q6H PRN Duncan, Hazel V, MD       albuterol  (PROVENTIL ) (2.5 MG/3ML) 0.083% nebulizer solution 2.5 mg  2.5 mg Nebulization Q2H PRN Duncan, Hazel V, MD       amitriptyline  (ELAVIL ) tablet 25 mg  25 mg Oral QHS Duncan, Hazel V, MD   25 mg at 12/10/23 2357   atorvastatin  (LIPITOR) tablet 20 mg  20 mg Oral Daily Duncan, Hazel V, MD   20 mg at 12/10/23 2357   azithromycin  (ZITHROMAX ) tablet 500 mg  500 mg Oral Daily Wouk, Devaughn Sayres, MD       cefTRIAXone  (ROCEPHIN ) 2 g in sodium chloride  0.9 % 100 mL IVPB  2 g Intravenous Q24H Duncan, Hazel V, MD 200 mL/hr at 12/11/23 0022 2 g at 12/11/23 0022   guaiFENesin  (MUCINEX )  12 hr tablet 600 mg  600 mg Oral BID Cleatus Hoof V, MD   600 mg at 12/11/23 9048   heparin  injection 5,000 Units  5,000 Units Subcutaneous Q8H Cleatus Hoof GAILS, MD   5,000 Units at 12/11/23 9366   hydrochlorothiazide  (HYDRODIURIL ) tablet 12.5 mg  12.5 mg Oral Daily Wouk, Devaughn Sayres, MD       hydrOXYzine  (ATARAX ) tablet 25 mg  25 mg Oral TID PRN Cleatus Hoof GAILS, MD   25 mg at 12/11/23 9947   insulin  aspart (novoLOG ) injection 0-5 Units  0-5 Units Subcutaneous QHS Cleatus Hoof GAILS, MD       insulin  aspart (novoLOG ) injection 0-6 Units  0-6 Units Subcutaneous TID WC Cleatus Hoof GAILS, MD       [START ON 12/12/2023] levothyroxine  (SYNTHROID ) tablet 125 mcg  125 mcg Oral Once per day on Monday Tuesday Wednesday Thursday Friday Saturday Sharilyn Estill HERO, [place]       And   levothyroxine  (SYNTHROID ) tablet 187.5 mcg  187.5 mcg Oral Every Sunday Sharilyn Estill HERO, RPH   187.5 mcg at 12/11/23 9052   ondansetron  (ZOFRAN ) tablet 4 mg  4 mg Oral Q6H PRN Cleatus Hoof GAILS, MD       Or   ondansetron  (ZOFRAN )  injection 4 mg  4 mg Intravenous Q6H PRN Cleatus Hoof GAILS, MD   4 mg at 12/11/23 0050   oxyCODONE  (Oxy IR/ROXICODONE ) immediate release tablet 5 mg  5 mg Oral Q6H PRN Kandis Devaughn Sayres, MD   5 mg at 12/11/23 9041   pantoprazole  (PROTONIX ) EC tablet 40 mg  40 mg Oral Daily Cleatus Hoof GAILS, MD   40 mg at 12/11/23 9048   traMADol  (ULTRAM ) tablet 50 mg  50 mg Oral Q12H PRN Cleatus Hoof GAILS, MD   50 mg at 12/11/23 0753    Allergies as of 12/10/2023 - Review Complete 12/10/2023  Allergen Reaction Noted   Amoxapine  11/04/2020   Latex Other (See Comments) 11/22/2022   Ceftazidime  04/27/2021   Iodinated contrast media Hives and Rash 11/22/2022   Penicillins  11/04/2020    Family History  Problem Relation Age of Onset   Cancer Paternal Aunt    Breast cancer Maternal Aunt 39    Social History   Socioeconomic History   Marital status: Married    Spouse name: Not on file   Number of children: Not on file   Years of education: Not on file   Highest education level: Not on file  Occupational History   Not on file  Tobacco Use   Smoking status: Never   Smokeless tobacco: Never  Vaping Use   Vaping status: Never Used  Substance and Sexual Activity   Alcohol use: Not Currently    Alcohol/week: 1.0 standard drink of alcohol    Types: 1 Cans of beer per week   Drug use: No   Sexual activity: Not Currently    Birth control/protection: None  Other Topics Concern   Not on file  Social History Narrative   Not on file   Social Drivers of Health   Financial Resource Strain: High Risk (07/06/2023)   Received from [facility]   Overall Financial Resource Strain (CARDIA)    Difficulty of Paying Living Expenses: Hard  Food Insecurity: No Food Insecurity (12/11/2023)   Hunger Vital Sign    Worried About Running Out of Food in the Last Year: Never true    Ran Out of Food in the Last Year:  Never true  Transportation Needs: Unmet Transportation Needs (12/11/2023)   PRAPARE -  Administrator, Civil Service (Medical): Yes    Lack of Transportation (Non-Medical): Yes  Physical Activity: Not on file  Stress: Not on file  Social Connections: Unknown (08/12/2023)   Social Connection and Isolation Panel    Frequency of Communication with Friends and Family: More than three times a week    Frequency of Social Gatherings with Friends and Family: More than three times a week    Attends Religious Services: More than 4 times per year    Active Member of [employer] or Organizations: Patient declined    Attends Banker Meetings: Patient declined    Marital Status: Not on file  Intimate Partner Violence: Not At Risk (12/11/2023)   Humiliation, Afraid, Rape, and Kick questionnaire    Fear of Current or Ex-Partner: No    Emotionally Abused: No    Physically Abused: No    Sexually Abused: No    Physical Exam: Vital signs in last 24 hours: Temp:  [98.1 F (36.7 C)-99.2 F (37.3 C)] 98.4 F (36.9 C) (08/17 0835) Pulse Rate:  [80-95] 83 (08/17 0835) Resp:  [13-22] 18 (08/17 0835) BP: (105-206)/(64-117) 171/91 (08/17 0835) SpO2:  [94 %-100 %] 97 % (08/17 0835) Weight:  [91.6 kg] 91.6 kg (08/16 1319) Last BM Date : 12/09/23 General:   Alert,  Well-developed, well-nourished, pleasant and cooperative in NAD Head:  Normocephalic and atraumatic. Eyes:  Sclera clear, no icterus.   Conjunctiva pink. Ears:  Normal auditory acuity. Nose:  No deformity, discharge,  or lesions. Lungs:  Clear throughout to auscultation.   No wheezes, crackles, or rhonchi. No acute distress. Heart:  Regular rate and rhythm; no murmurs, clicks, rubs,  or gallops. Abdomen:  Soft, nontender and nondistended. No masses, hepatosplenomegaly or hernias noted. Normal bowel sounds, without guarding, and without rebound.   Extremities:  Without clubbing or edema.  Intake/Output from previous day: No intake/output data recorded. Intake/Output this shift: No intake/output data  recorded.  Lab Results: Recent Labs    12/10/23 1404 12/11/23 0424  WBC 5.7 3.8*  HGB 9.4* 8.8*  HCT 25.7* 28.1*  PLT 78* 70*   BMET Recent Labs    12/10/23 1404  NA 137  K 3.9  CL 96*  CO2 25  GLUCOSE 124*  BUN 45*  CREATININE 6.99*  CALCIUM  8.5*   LFT Recent Labs    12/10/23 1404  PROT 7.3  ALBUMIN  3.1*  AST 41  ALT 8  ALKPHOS 60  BILITOT 0.9   PT/INR No results for input(s): LABPROT, INR in the last 72 hours. Hepatitis Panel No results for input(s): HEPBSAG, HCVAB, HEPAIGM, HEPBIGM in the last 72 hours.  Studies/Results: DG Wrist 2 Views Left Result Date: 12/11/2023 CLINICAL DATA:  Bilateral wrist pain EXAM: LEFT WRIST - 2 VIEW; RIGHT WRIST - 2 VIEW COMPARISON:  12/10/2023 FINDINGS: Left wrist: Frontal and lateral views are obtained. No acute fracture, subluxation, or dislocation. Mild joint space narrowing and osteophyte formation at the first carpometacarpal joint. Remaining joint spaces are well preserved. Moderate subcutaneous edema throughout the distal left forearm, wrist, and base of the hand. No subcutaneous gas. Right wrist: Frontal and lateral views are obtained. No fracture, subluxation, or dislocation. Joint spaces are well preserved. Soft tissues are unremarkable. IMPRESSION: Left wrist: 1. No acute fracture. 2. Diffuse soft tissue swelling from the left forearm through the base of the hand, which could reflect cellulitis. 3. 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 Right wrist: 1. No acute fracture.  Unremarkable exam. Electronically Signed   By: Ozell Daring M.D.   On: 12/11/2023 11:29   DG Wrist 2 Views Right Result Date: 12/11/2023 CLINICAL DATA:  Bilateral wrist pain EXAM: LEFT WRIST - 2 VIEW; RIGHT WRIST - 2 VIEW COMPARISON:  12/10/2023 FINDINGS: Left wrist: Frontal and lateral views are obtained. No acute fracture, subluxation, or dislocation. Mild joint space narrowing and osteophyte formation at the first  carpometacarpal joint. Remaining joint spaces are well preserved. Moderate subcutaneous edema throughout the distal left forearm, wrist, and base of the hand. No subcutaneous gas. Right wrist: Frontal and lateral views are obtained. No fracture, subluxation, or dislocation. Joint spaces are well preserved. Soft tissues are unremarkable. IMPRESSION: Left wrist: 1. No acute fracture. 2. Diffuse soft tissue swelling from the left forearm through the base of the hand, which could reflect cellulitis. 3. 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 Right wrist: 1. No acute fracture.  Unremarkable exam. Electronically Signed   By: Ozell Daring M.D.   On: 12/11/2023 11:29   DG Chest 1 View Result Date: 12/10/2023 CLINICAL DATA:  Cough EXAM: CHEST  1 VIEW COMPARISON:  07/29/2023 FINDINGS: 2 frontal views of the chest demonstrate an unremarkable cardiac silhouette. There is patchy airspace disease within the left lower lobe consistent with bronchopneumonia. No effusion or pneumothorax. No acute bony abnormalities. IMPRESSION: 1. Patchy left lower lobe bronchopneumonia. Electronically Signed   By: Ozell Daring M.D.   On: 12/10/2023 15:21   DG Forearm Left Result Date: 12/10/2023 CLINICAL DATA:  Forearm and wrist pain EXAM: LEFT FOREARM - 2 VIEW COMPARISON:  None Available. FINDINGS: There is no evidence of fracture or other focal bone lesions. Soft tissues are unremarkable. IMPRESSION: Negative. Electronically Signed   By: Michaeline Blanch M.D.   On: 12/10/2023 13:53    Assessment/Plan:  58 year old female with a past medical history of end-stage renal disease on hemodialysis Monday Wednesday Friday schedule, ANCA related glomerulonephritis, diet-controlled diabetes, orthostatic hypotension and thrombocytopenia and CLL now admitted with history of chest congestion, cough and shortness of breath.  She is now being treated with antibiotics for pneumonia.  ESRD: Will schedule for dialysis tomorrow to stay  on Monday Wednesday Friday schedule.  ANEMIA: Will continue anemia protocol with Epogen  and  iron.  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  HTN/VOL: Continue midodrine .  Diabetes: Continue insulin  protocol.  Possible pneumonia: Continue Rocephin  and Zithromax .  Labs and medications reviewed. Will continue to monitor closely.    LOS: 0 Pinkey Edman, MD [facility] @TODAY @12 :25 PM

## 2023-12-11 NOTE — Evaluation (Addendum)
 Physical Therapy Evaluation Patient Details Name: Tonya Myers MRN: [id number] DOB: May 14, 1965 Today's Date: 12/11/2023  History of Present Illness  Tonya Myers is a 58 y.o. 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 She presents with a history of congested cough and fevers, shortness of breath with chest pain on coughing and worsening of her baseline frailty causing her to fall onto outstretched arms about 3 days ago.  The fall occurred while she was ambulating with her walker to answer the door from a driver delivering cough medicine.  Since the fall, she has had progressively worsening bilateral wrist pain, 9 out of 10, which impairs her ability to use her walker.  She feels like the wrists are swollen.  Clinical Impression  Patient seen for initial PT evaluation due to decline in functional status. Patient is A&O x 4. Baseline mobility reported as modI with RW , currently requiring minA with RW +1  for transfers and ambulation. Gait assessed with RW, limited by recent decline in functional endurance and strength. Pt able to walk 100 feet with RW. Vitals stable. Patient presents with limited strength and impaired endurance and mobility limitations secondary to general fatigue. Recommend skilled PT to address safety, mobility, and discharge planning. PT recommends skilled nursing to rebuild strength to navigate 16 steps for home entry.        If plan is discharge home, recommend the following: A  little help with walking and/or transfers;Help with stairs or ramp for entrance   Can travel by private vehicle   Yes    Equipment Recommendations    Recommendations for Other Services       Functional Status Assessment       Precautions / Restrictions Restrictions Weight Bearing Restrictions Per Provider Order: No      Mobility  Bed Mobility Overal bed mobility: Independent                  Transfers Overall transfer level: Modified independent Equipment used: Rolling walker (2 wheels), 2 person hand held assist               General transfer comment: required bed elevation about 6 in. to stand from seated position; assistance provided by friend in room    Ambulation/Gait Ambulation/Gait assistance: Min assist Gait Distance (Feet): 100 Feet Assistive device: Rolling walker (2 wheels) Gait Pattern/deviations: Step-to pattern Gait velocity: slow gait speed   Pre-gait activities: LAQ x 10 each leg; ankle pumps on x10 each leg General Gait Details: slight hesitation prior to intiating gait; limited use of UE due to wrist hand pain/weakness. pt able to use UE for safety after gait is intiated  Acupuncturist Bed    Modified Rankin (Stroke Patients Only)       Balance Overall balance assessment: Needs assistance Sitting-balance support: Bilateral upper extremity supported Sitting balance-Leahy Scale: Normal     Standing balance support: Bilateral upper extremity supported Standing balance-Leahy Scale: Good  Pertinent Vitals/Pain      Home Living Family/patient expects to be discharged to:: Private residence Living Arrangements: Other relatives Available Help at Discharge: Family;Available PRN/intermittently;Neighbor Type of Home: House Home Access: Stairs to enter Entrance Stairs-Rails: None Entrance Stairs-Number of Steps: 16 Alternate Level Stairs-Number  of Steps: flight to 2nd floor bedroom Home Layout: Two level;Bed/bath upstairs Home Equipment: Grab bars - tub/shower;Cane - single point;Rollator (4 wheels) Additional Comments: reports having WC at one time, but is unsure where it is located    Prior Function Prior Level of Function : Independent/Modified Independent;Driving             Mobility Comments: Mod indep with SPC for household and limited community mobilization (should have been using RW per patient); drives self to/from dialysis.  Does endorse at least 4 falls in previous month. ADLs Comments: drives herself to HD     Extremity/Trunk Assessment        Lower Extremity Assessment Lower Extremity Assessment: Generalized weakness       Communication        Cognition                                         Cueing       General Comments      Exercises     Assessment/Plan    PT Assessment Patient needs continued PT services  PT Problem List Decreased strength;Decreased activity tolerance;Decreased balance;Decreased coordination;Decreased mobility       PT Treatment Interventions DME instruction;Gait training;Stair training;Functional mobility training;Therapeutic activities;Patient/family education;Neuromuscular re-education;Balance training;Therapeutic exercise    PT Goals (Current goals can be found in the Care Plan section)  Acute Rehab PT Goals Patient Stated Goal: wants to go to a SNF PT Goal Formulation: With patient Time For Goal Achievement: 12/25/23 Potential to Achieve Goals: Good    Frequency Min 2X/week     Co-evaluation               AM-PAC PT 6 Clicks Mobility  Outcome Measure Help needed turning from your back to your side while in a flat bed without using bedrails?: None Help needed moving from lying on your back to sitting on the side of a flat bed without using bedrails?: A Little Help needed moving to and from a bed to a chair (including a  wheelchair)?: A Little Help needed standing up from a chair using your arms (e.g., wheelchair or bedside chair)?: A Little Help needed to walk in hospital room?: A Little Help needed climbing 3-5 steps with a railing? : A Lot 6 Click Score: 18    End of Session Equipment Utilized During Treatment: Gait belt Activity Tolerance: Patient tolerated treatment well Patient left: in bed;with bed alarm set   PT Visit Diagnosis: Unsteadiness on feet (R26.81);Other abnormalities of gait and mobility (R26.89);History of falling (Z91.81);Muscle weakness (generalized) (M62.81);Difficulty in walking, not elsewhere classified (R26.2)    Time: 8984-8963 PT Time Calculation (min) (ACUTE ONLY): 21 min   Charges:   PT Evaluation $PT Eval Low Complexity: 1 Low   PT General Charges $$ ACUTE PT VISIT: 1 Visit         Sherlean Lesches DPT, PT    Norinne Jeane A [facility] 12/11/2023, 2:42 PM

## 2023-12-11 NOTE — IPAL (Signed)
  Interdisciplinary Goals of Care Family Meeting   Date carried out: 12/11/2023  Location of the meeting: Bedside  Member's involved: Physician  Durable Power of Attorney or acting medical decision maker: patient    Discussion: We discussed goals of care for [employer] .   I have reviewed medical records including EPIC notes, labs and imaging, assessed the patient and discussed major active diagnoses, plan of care, natural trajectory, prognosis, GOC, EOL wishes, disposition and options including Full code/DNI/DNR and the concept of comfort care if DNR is elected. Questions and concerns were addressed.  Patient states she has been through a lot and if her situation with the decline she wants to go peacefully.  States she would not want to be resuscitated.  She feels content with what she has achieved in life and when the time comes she is ready Election for DNR/DNI status.   Code status:   Code Status: Limited: Do not attempt resuscitation (DNR) -DNR-LIMITED -Do Not Intubate/DNI    Disposition: Continue current acute care  Time spent for the meeting: 30    Delayne LULLA Solian, MD  12/11/2023, 4:31 AM

## 2023-12-11 NOTE — Evaluation (Signed)
 Occupational Therapy Evaluation Patient Details Name: Tonya Myers MRN: [id number] DOB: [birth year] Today's Date: 12/11/2023   History of Present Illness   Pt is a 58 y.o. female being admitted for community-acquired pneumonia and bilateral wrist pain s/p fall at home. 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 PTA, pt reports living alone in a 2 level home with family assist PRN. She reports being MOD I with ADL/IADLs performance and SPC use for mobility. She does endorse multiple falls.  Pt presents with deficits in strength, pain management, balance and activity tolerance, affecting safe and optimal ADL completion. She continues to report swelling and 10/10 pain to bil wrists. Pt currently requires supervision with increased time/effort for bed mobility. Mod A with bed height elevated to stand to RW and CGA for ambulation using RW to bathroom and back. Mod/Max A for STS transfer from low toilet in bathroom as pt has limited use of hands for push off d/t pain. Able to perform seated hygiene with SBA and Min A for clothing management. Standing grooming tasks performed with unilateral support on sink and CGA. Pt fatigued and opting to go back to bed to rest.  Pt would benefit from skilled OT services to address noted impairments and functional limitations to maximize safety and independence while minimizing future risk of falls, injury, and readmission. Do anticipate the need for follow up OT services upon acute  hospital DC.      If plan is discharge home, recommend the following:   A lot of help with walking and/or transfers;A lot of help with bathing/dressing/bathroom;Assist for transportation;Assistance with cooking/housework;Help with stairs or ramp for entrance     Functional Status Assessment   Patient has had a recent decline in their functional status and demonstrates the ability to make significant improvements in function in a reasonable and predictable amount of time.     Equipment Recommendations   Other (comment) (defer)     Recommendations for Other Services         Precautions/Restrictions   Precautions Precautions: Fall Recall of Precautions/Restrictions: Intact Restrictions Weight Bearing Restrictions Per Provider Order: No     Mobility Bed Mobility Overal bed mobility: Needs Assistance Bed Mobility: Supine to Sit     Supine to sit: Supervision, Used rails, HOB elevated     General bed mobility comments: increased time/effort    Transfers Overall transfer level: Needs assistance Equipment used: Rolling walker (2 wheels) Transfers: Sit to/from Stand Sit to Stand: Mod assist, From elevated surface           General transfer comment: required incresed bed height to stand from EOB to RW with Mod A for lifting; able to ambulate with CGA once up using RW      Balance Overall balance assessment: Needs assistance Sitting-balance support: Feet supported Sitting balance-Leahy Scale: Good     Standing balance support: Reliant on assistive device for balance, During functional activity, Bilateral upper extremity supported Standing balance-Leahy Scale: Fair  ADL either performed or assessed with clinical judgement   ADL Overall ADL's : Needs assistance/impaired     Grooming: Oral care;Wash/dry face;Wash/dry hands;Standing;Contact guard assist                   Toilet Transfer: Regular  Toilet;Rolling walker (2 wheels);Moderate assistance;Maximal assistance Toilet Transfer Details (indicate cue type and reason): to stand from low toilet d/t pain in hands decreasing ability to push up Toileting- Clothing Manipulation and Hygiene: Minimal assistance;Sit to/from stand Toileting - Clothing Manipulation Details (indicate cue type and reason): able to perform hygiene, needed assist to manage clothing     Functional mobility during ADLs: Contact guard assist;Rolling walker (2 wheels)       Vision         Perception         Praxis         Pertinent Vitals/Pain Pain Assessment Pain Assessment: 0-10 Pain Score: 10-Worst pain ever Pain Location: bil wrists Pain Descriptors / Indicators: Aching, Sore Pain Intervention(s): Monitored during session, Repositioned, Limited activity within patient's tolerance     Extremity/Trunk Assessment Upper Extremity Assessment Upper Extremity Assessment: Generalized weakness   Lower Extremity Assessment Lower Extremity Assessment: Generalized weakness       Communication Communication Communication: No apparent difficulties   Cognition Arousal: Alert Behavior During Therapy: WFL for tasks assessed/performed Cognition: No apparent impairments                               Following commands: Intact       Cueing  General Comments      limited by pain   Exercises Other Exercises Other Exercises: edu on role of OT in acute setting   Shoulder Instructions      Home Living Family/patient expects to be discharged to:: Private residence Living Arrangements: Other relatives Available Help at Discharge: Family;Available PRN/intermittently;Neighbor Type of Home: House Home Access: Stairs to enter [employer] of Steps: 16 Entrance Stairs-Rails: None Home Layout: Two level;Bed/bath upstairs Alternate Level Stairs-Number of Steps: flight to 2nd floor bedroom Alternate Level Stairs-Rails:  Left Bathroom Shower/Tub: Producer, television/film/video: Handicapped height     Home Equipment: Grab bars - tub/shower;Cane - single point;Rollator (4 wheels)   Additional Comments: reports having WC at one time, but is unsure where it is located      Prior Functioning/Environment Prior Level of Function : Independent/Modified Independent;Driving             Mobility Comments: Mod indep with SPC for household and limited community mobilization (should have been using RW per patient); drives self to/from dialysis.  Does endorse at least 4 falls in previous month. ADLs Comments: drives herself to HD    OT Problem List: Decreased strength;Decreased activity tolerance;Impaired balance (sitting and/or standing);Pain   OT Treatment/Interventions: Self-care/ADL training;Therapeutic exercise;Therapeutic activities;Energy conservation;DME and/or AE instruction;Patient/family education;Balance training      OT Goals(Current goals can be found in the care plan section)   Acute Rehab OT Goals Patient Stated Goal: get better OT Goal Formulation: With patient Time For Goal Achievement: 12/25/23 Potential to Achieve Goals: Fair ADL Goals Pt Will Perform Lower Body Bathing: with contact guard assist;sitting/lateral leans;sit to/from stand Pt Will Perform Lower Body Dressing: with contact guard assist;sit to/from stand;sitting/lateral leans Pt Will Transfer to Toilet: with supervision;with contact guard assist;ambulating;regular height toilet   OT Frequency:  Min 2X/week    Co-evaluation  AM-PAC OT 6 Clicks Daily Activity     Outcome Measure Help from another person eating meals?: None Help from another person taking care of personal grooming?: A Little Help from another person toileting, which includes using toliet, bedpan, or urinal?: A Lot Help from another person bathing (including washing, rinsing, drying)?: A Lot Help from another person to put on and  taking off regular upper body clothing?: A Little Help from another person to put on and taking off regular lower body clothing?: A Lot 6 Click Score: 16   End of Session Equipment Utilized During Treatment: Rolling walker (2 wheels);Gait belt Nurse Communication: Mobility status  Activity Tolerance: Patient tolerated treatment well Patient left: in bed;with call bell/phone within reach;with bed alarm set  OT Visit Diagnosis: Other abnormalities of gait and mobility (R26.89);Muscle weakness (generalized) (M62.81);Repeated falls (R29.6)                Time: 8487-8474 OT Time Calculation (min): 13 min Charges:  OT General Charges $OT Visit: 1 Visit OT Evaluation $OT Eval Moderate Complexity: 1 Mod Netanya Yazdani, OTR/L 12/11/23, 4:02 PM  Asa Baudoin E Osias Resnick 12/11/2023, 3:59 PM

## 2023-12-11 NOTE — Consult Note (Signed)
 ORTHOPAEDIC CONSULTATION  REQUESTING PHYSICIAN: Wouk, Devaughn Sayres, MD  Chief Complaint: Bilateral wrist pain  HPI: Tonya Myers is a 59 y.o. female who complains of bilateral wrist pain after a fall last week.  She was able to push herself up with her hands but had later developed increasing pain.  She has also had some warmth in the wrist.  She says she has a history of gout.  Uric acid level was normal at 4.6.  She has been admitted to [facility] for pneumonia in addition.  X-rays of both wrists are unremarkable with no sign of fracture dislocation or degenerative change.  Past Medical History:  Diagnosis Date   Acute hemorrhoid 03/11/2015   Anxiety    Arthritis    Asthma    Cancer (HCC)    Chronic back pain    CLL (chronic lymphocytic leukemia) (HCC)    Depression    Diabetes mellitus without complication (HCC)    Genital herpes    type 2   Hypertension    Hypothyroidism    Renal disorder    Vertigo    Past Surgical History:  Procedure Laterality Date   ABDOMINAL HYSTERECTOMY     BILATERAL SALPINGOOPHORECTOMY  2009   BREAST BIOPSY Right 05/17/2016   FIBROADENOMATOUS CHANGE AND SCLEROSING ADENOSIS WITH   COLONOSCOPY WITH PROPOFOL  N/A 06/16/2015   Procedure: COLONOSCOPY WITH PROPOFOL ;  Surgeon: Donnice Vaughn Manes, MD;  Location: [facility] ENDOSCOPY;  Service: Endoscopy;  Laterality: N/A;   DIALYSIS/PERMA CATHETER INSERTION N/A 12/17/2020   Procedure: DIALYSIS/PERMA CATHETER INSERTION;  Surgeon: Marea Selinda RAMAN, MD;  Location: [facility] INVASIVE CV LAB;  Service: Cardiovascular;  Laterality: N/A;   LAPAROSCOPIC SUPRACERVICAL HYSTERECTOMY  2009   due to leio   OOPHORECTOMY     TEMPORARY DIALYSIS CATHETER N/A 12/10/2020   Procedure: TEMPORARY DIALYSIS CATHETER;  Surgeon: Jama Cordella MATSU, MD;  Location: [facility] INVASIVE CV LAB;  Service: Cardiovascular;  Laterality: N/A;   THORACIC LAMINECTOMY FOR EPIDURAL ABSCESS Bilateral 06/17/2020   Procedure: THORACIC LAMINECTOMY FOR EPIDURAL  ABSCESS;  Surgeon: Bluford Standing, MD;  Location: [facility] ORS;  Service: Neurosurgery;  Laterality: Bilateral;   Social History   Socioeconomic History   Marital status: Married    Spouse name: Not on file   Number of children: Not on file   Years of education: Not on file   Highest education level: Not on file  Occupational History   Not on file  Tobacco Use   Smoking status: Never   Smokeless tobacco: Never  Vaping Use   Vaping status: Never Used  Substance and Sexual Activity   Alcohol use: Not Currently    Alcohol/week: 1.0 standard drink of alcohol    Types: 1 Cans of beer per week   Drug use: No   Sexual activity: Not Currently    Birth control/protection: None  Other Topics Concern   Not on file  Social History Narrative   Not on file   Social Drivers of Health   Financial Resource Strain: High Risk (07/06/2023)   Received from [facility]   Overall Financial Resource Strain (CARDIA)    Difficulty of Paying Living Expenses: Hard  Food Insecurity: No Food Insecurity (12/11/2023)   Hunger Vital Sign    Worried About Running Out of Food in the Last Year: Never true    Ran Out of Food in the Last Year: Never true  Transportation Needs: Unmet Transportation Needs (12/11/2023)   PRAPARE - Transportation    Lack of Transportation (  Medical): Yes    Lack of Transportation (Non-Medical): Yes  Physical Activity: Not on file  Stress: Not on file  Social Connections: Unknown (08/12/2023)   Social Connection and Isolation Panel    Frequency of Communication with Friends and Family: More than three times a week    Frequency of Social Gatherings with Friends and Family: More than three times a week    Attends Religious Services: More than 4 times per year    Active Member of [employer] or Organizations: Patient declined    Attends Banker Meetings: Patient declined    Marital Status: Not on file   Family History  Problem Relation Age of Onset   Cancer  Paternal Aunt    Breast cancer Maternal Aunt 52   Allergies  Allergen Reactions   Amoxapine     Other Reaction(s): Unknown   Latex Other (See Comments)   Ceftazidime     Other reaction(s): Confusion, Hallucination, Unknown Encephalopathy - improved after dialysis/some concern for cephalosporin neurotoxicity Tolerated without confusion 06/2021. Encephalopathy - improved after dialysis/some concern for cephalosporin neurotoxicity Tolerated without confusion 06/2021.    Iodinated Contrast Media Hives and Rash    Other Reaction(s): Hives   Penicillins     Other Reaction(s): Unknown   Prior to Admission medications   Medication Sig Start Date End Date Taking? Authorizing Provider  acetaminophen  (TYLENOL ) 325 MG tablet Take 2 tablets (650 mg total) by mouth every 6 (six) hours as needed for mild pain (or Fever >/= 101). 07/30/20  Yes Angiulli, Toribio PARAS, PA-C  albuterol  (VENTOLIN  HFA) 108 (90 Base) MCG/ACT inhaler Inhale 2 puffs into the lungs every 6 (six) hours as needed for wheezing or shortness of breath. 10/18/21  Yes [provider]  amitriptyline  (ELAVIL ) 25 MG tablet Take 1 tablet (25 mg total) by mouth at bedtime. 12/06/23  Yes Marcelino Nurse, MD  atorvastatin  (LIPITOR) 20 MG tablet Take 20 mg by mouth daily. 10/18/21  Yes [provider]  cetirizine (ZYRTEC) 10 MG tablet Take 1 tablet by mouth daily. 07/07/23  Yes [provider]  fluticasone  (FLONASE ) 50 MCG/ACT nasal spray Place 2 sprays into both nostrils daily.   Yes [provider]  folic acid  (FOLVITE ) 1 MG tablet Take 1 tablet by mouth daily. 10/18/21  Yes [provider]  hydrOXYzine  (ATARAX ) 25 MG tablet Take 1 tablet (25 mg total) by mouth 3 (three) times daily as needed for itching. 08/19/23  Yes Von Bellis, MD  levothyroxine  (SYNTHROID ) 125 MCG tablet Take 125 mcg by mouth daily. Take 1 tablet daily except Sunday take 1 and 1/2 tablet (187.5 mg). 12/08/21  Yes [provider]   lipase/protease/amylase (CREON ) 12000-38000 units CPEP capsule Take 1 capsule by mouth. 09/06/23  Yes [provider]  midodrine  (PROAMATINE ) 2.5 MG tablet Take 2.5 mg by mouth as directed. Only on dialysis days. (Monday Wednesday and Friday) 11/24/23  Yes [provider]  multivitamin (RENA-VIT) TABS tablet Take 1 tablet by mouth at bedtime. 12/22/21  Yes Sreenath, Sudheer B, MD  ondansetron  (ZOFRAN -ODT) 8 MG disintegrating tablet Take 1 tablet (8 mg total) by mouth every 8 (eight) hours as needed for nausea or vomiting. 11/04/23  Yes Jacobo Evalene PARAS, MD  pantoprazole  (PROTONIX ) 40 MG tablet Take 1 tablet (40 mg total) by mouth 2 (two) times daily for 28 days, THEN 1 tablet (40 mg total) daily. 08/19/23 06/11/26 Yes Von Bellis, MD  promethazine  (PHENERGAN ) 12.5 MG tablet Take 1 tablet (12.5 mg total) by  mouth every 6 (six) hours as needed. 11/06/23  Yes Willo Dunnings, MD  traMADol  (ULTRAM ) 50 MG tablet Take 1 tablet (50 mg total) by mouth every 8 (eight) hours as needed. Patient taking differently: Take 50 mg by mouth at bedtime. 09/01/23  Yes Jacobo Evalene PARAS, MD  zinc  sulfate 220 (50 Zn) MG capsule Take 1 capsule (220 mg total) by mouth daily. 12/23/21  Yes Sreenath, Sudheer B, MD  Blood Glucose Monitoring Suppl DEVI 1 each by Does not apply route 3 (three) times daily. May dispense any manufacturer covered by patient's insurance. 08/19/23   Von Bellis, MD  calcium  acetate (PHOSLO ) 667 MG capsule Take 1,334 mg by mouth 3 (three) times daily with meals. Patient not taking: Reported on 12/10/2023 05/11/23 05/10/24  [provider]  Glucose Blood (BLOOD GLUCOSE TEST STRIPS) STRP 1 each by Does not apply route 3 (three) times daily. Use as directed to check blood sugar. May dispense any manufacturer covered by patient's insurance and fits patient's device. 08/19/23   Von Bellis, MD  Lancet Device MISC 1 each by Does not apply route 3 (three) times daily. May dispense any  manufacturer covered by patient's insurance. 08/19/23   Von Bellis, MD  Lancets MISC 1 each by Does not apply route 3 (three) times daily. Use as directed to check blood sugar. May dispense any manufacturer covered by patient's insurance and fits patient's device. 08/19/23   Von Bellis, MD  valACYclovir  (VALTREX ) 500 MG tablet Take 500 mg by mouth as directed. Take 1 tablet daily on dialysis days.(Monday, Wednesday and Friday)    [provider]  budesonide-formoterol (SYMBICORT) 160-4.5 MCG/ACT inhaler Inhale into the lungs. 07/14/18 03/15/19  [provider]   DG Wrist 2 Views Left Result Date: 12/11/2023 CLINICAL DATA:  Bilateral wrist pain EXAM: LEFT WRIST - 2 VIEW; RIGHT WRIST - 2 VIEW COMPARISON:  12/10/2023 FINDINGS: Left wrist: Frontal and lateral views are obtained. No acute fracture, subluxation, or dislocation. Mild joint space narrowing and osteophyte formation at the first carpometacarpal joint. Remaining joint spaces are well preserved. Moderate subcutaneous edema throughout the distal left forearm, wrist, and base of the hand. No subcutaneous gas. Right wrist: Frontal and lateral views are obtained. No fracture, subluxation, or dislocation. Joint spaces are well preserved. Soft tissues are unremarkable. IMPRESSION: Left wrist: 1. No acute fracture. 2. Diffuse soft tissue swelling from the left forearm through the base of the hand, which could reflect cellulitis. 3. 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 Right wrist: 1. No acute fracture.  Unremarkable exam. Electronically Signed   By: Ozell Daring M.D.   On: 12/11/2023 11:29   DG Wrist 2 Views Right Result Date: 12/11/2023 CLINICAL DATA:  Bilateral wrist pain EXAM: LEFT WRIST - 2 VIEW; RIGHT WRIST - 2 VIEW COMPARISON:  12/10/2023 FINDINGS: Left wrist: Frontal and lateral views are obtained. No acute fracture, subluxation, or dislocation. Mild joint space narrowing and osteophyte formation at the first  carpometacarpal joint. Remaining joint spaces are well preserved. Moderate subcutaneous edema throughout the distal left forearm, wrist, and base of the hand. No subcutaneous gas. Right wrist: Frontal and lateral views are obtained. No fracture, subluxation, or dislocation. Joint spaces are well preserved. Soft tissues are unremarkable. IMPRESSION: Left wrist: 1. No acute fracture. 2. Diffuse soft tissue swelling from the left forearm through the base of the hand, which could reflect cellulitis. 3. 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 Right wrist: 1. No acute fracture.  Unremarkable exam. Electronically Signed  By: Ozell Daring M.D.   On: 12/11/2023 11:29   DG Chest 1 View Result Date: 12/10/2023 CLINICAL DATA:  Cough EXAM: CHEST  1 VIEW COMPARISON:  07/29/2023 FINDINGS: 2 frontal views of the chest demonstrate an unremarkable cardiac silhouette. There is patchy airspace disease within the left lower lobe consistent with bronchopneumonia. No effusion or pneumothorax. No acute bony abnormalities. IMPRESSION: 1. Patchy left lower lobe bronchopneumonia. Electronically Signed   By: Ozell Daring M.D.   On: 12/10/2023 15:21   DG Forearm Left Result Date: 12/10/2023 CLINICAL DATA:  Forearm and wrist pain EXAM: LEFT FOREARM - 2 VIEW COMPARISON:  None Available. FINDINGS: There is no evidence of fracture or other focal bone lesions. Soft tissues are unremarkable. IMPRESSION: Negative. Electronically Signed   By: Michaeline Blanch M.D.   On: 12/10/2023 13:53    Positive ROS: All other systems have been reviewed and were otherwise negative with the exception of those mentioned in the HPI and as above.  Physical Exam: General: Alert, no acute distress Cardiovascular: No pedal edema Respiratory: No cyanosis, no use of accessory musculature GI: No organomegaly, abdomen is soft and non-tender Skin: No lesions in the area of chief complaint Neurologic: Sensation intact distally Psychiatric:  Patient is competent for consent with normal mood and affect Lymphatic: No axillary or cervical lymphadenopathy  MUSCULOSKELETAL: Bilateral wrist tenderness.  Left is little worse than the right and a little warmer.  No real cellulitis.  Range of motion is somewhat painful.  She is tender over the dorsum of the metacarpals.  Fingers are not swollen or especially tender.  Neurovascular status is good.    Assessment: Bilateral wrist sprains with possible low-grade gout  Plan: Recommend bilateral wrist braces and ice to the wrist as needed Consider colchicine  0.6 mg twice daily for suspected low-grade gout.    Kayla FORBES Pinal, MD [phone]   12/11/2023 1:42 PM

## 2023-12-11 NOTE — Assessment & Plan Note (Signed)
 Fall onto outstretched arms 12/08/2023 Difficulty ambulating with walker due to wrist pain X-ray of left forearm unremarkable Uric acid normal We will get orthopedic evaluation, recommendations regarding need for further evaluation for occult injury Pain control

## 2023-12-11 NOTE — Progress Notes (Addendum)
 PROGRESS NOTE    Tonya Myers  FMW:[id number] DOB: 1965/07/25 DOA: 12/10/2023 PCP: Valora Agent, MD     Brief Narrative:   From admission h and p  Tonya Myers is a 58 y.o. 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  She presents with a 1 week history of congested cough and fevers, shortness of breath with chest pain on coughing and worsening of her baseline frailty causing her to fall onto outstretched arms about 3 days ago.  The fall occurred while she was ambulating with her walker to answer the door from a driver delivering cough medicine. Since the fall, she has had progressively worsening bilateral wrist pain, 9 out of 10, which impairs her ability to use her walker.  She feels like the wrists are swollen.    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)   Bilateral wrist pain   Orthostatic hypotension   End-stage renal disease on hemodialysis (HCC)   Failure to thrive in adult/frailty   Hypothyroidism   Essential hypertension   Chronic diarrhea   Anemia in ESRD (end-stage renal disease) (HCC)   Quadriplegia (HCC)   Stroke (HCC)   Chest pain   Diet-controlled diabetes mellitus (HCC)   CAP (community acquired pneumonia) Presents with cough, shortness of  breath, fevers at home, afebrile here, relative leukocytosis based on baseline leukopenia Chest x-ray with left lower lobe bronchopneumonia Reports mild improvement since starting abx Continue Rocephin  and azithromycin  Will check respiratory panels Sputum culture if able to produce And urine antigens Given pleuritic pain, lack of fever/leukocytosis, consider PE, will check dimer UPDATE: dimer elevated, will order vq scan   Bilateral wrist pain Fall onto outstretched arms 12/08/2023 Difficulty ambulating with walker due to wrist pain X-ray of left forearm unremarkable Uric acid normal Complains of b/l pain and swelling, also some ankle pain We will get orthopedic evaluation, recommendations regarding need for further evaluation for occult injury Will w/u possible rheumatolic cause with crp, ana, rf, anti-ccp Will check wrist x-rays UPDATE: ortho says may be gout, will trial oral steroids   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 Will hold midodrine , resume hydrochlorothiazide , monitor   Failure to thrive in adult/frailty Peripheral neuropathy-ambulate with walker History of meningitis/epidural abscess s/p multilevel laminectomy PT OT consulted   End-stage renal disease on hemodialysis [facility]) Nephrology consulted for continuation of dialysis   Chronic diarrhea No acute issues Continue Pancreaze  and Protonix    Diet-controlled diabetes mellitus (HCC) Consistent carbohydrate diet with daily fingersticks  Hypothyroid - home synthroid    DVT prophylaxis:  heparin  Code Status: dnr/dni Family Communication: aunt Reena updated telephonically 8/17  Level of care: Med-Surg Status is: Observation     Consultants:  Ortho nephrology  Procedures: Hemodialysis pending  Antimicrobials:  See above    Subjective: Reports mild improvement in dyspnea and cough. Ongoing wrist pain/swelling  Objective: Vitals:   12/10/23 2144 12/11/23 0301 12/11/23 0458 12/11/23 0633  BP: (!) 174/117 (!) 206/82  122/64  Pulse: 80 86 81 83  Resp: 18     Temp: 98.1 F (36.7 C)  98.3 F (36.8 C)   TempSrc:   Oral   SpO2: 100%     Weight:      Height:       No intake or output data in the 24 hours ending 12/11/23 0831 Filed Weights   12/10/23 1319  Weight: 91.6 kg    Examination:  General exam: Appears calm and comfortable  Respiratory system: Clear to auscultation save for rales at bases Cardiovascular system: S1 & S2 heard, RRR.   Gastrointestinal system: Abdomen is nondistended, soft and nontender.   Central nervous system: Alert and oriented. No focal neurological deficits. Extremities: Symmetric 5 x 5 power. Mild b/l wrist swelling Skin: No rashes, lesions or ulcers. Left arm fistula Psychiatry: Judgement and insight appear normal. Mood & affect appropriate.     Data Reviewed: I have personally reviewed following labs and imaging studies  CBC: Recent Labs  Lab 12/10/23 1404 12/11/23 0424  WBC 5.7 3.8*  NEUTROABS 4.8  --   HGB 9.4* 8.8*  HCT 25.7* 28.1*  MCV 98.8 93.4  PLT 78* 70*   Basic Metabolic Panel: Recent Labs  Lab 12/10/23 1404  NA 137  K 3.9  CL 96*  CO2 25  GLUCOSE 124*  BUN 45*  CREATININE 6.99*  CALCIUM  8.5*   GFR: Estimated Creatinine Clearance: 10.9 mL/min (A) (by C-G formula based on SCr of 6.99 mg/dL (H)). Liver Function Tests: Recent Labs  Lab 12/10/23 1404  AST 41  ALT 8  ALKPHOS 60  BILITOT 0.9  PROT 7.3  ALBUMIN  3.1*   No results for input(s): LIPASE, AMYLASE in the last 168  hours. No results for input(s): AMMONIA in the last 168 hours. Coagulation Profile: No results for input(s): INR, PROTIME in the last 168 hours. Cardiac Enzymes: No results for input(s): CKTOTAL, CKMB, CKMBINDEX, TROPONINI in the last 168 hours. BNP (last 3 results) No results for input(s): PROBNP in the last 8760 hours. HbA1C: No results for input(s): HGBA1C in the last 72 hours. CBG: Recent Labs  Lab 12/11/23 0729  GLUCAP 102*   Lipid Profile: No results for input(s): CHOL, HDL, LDLCALC, TRIG, CHOLHDL, LDLDIRECT in the last 72 hours. Thyroid  Function Tests: No results for input(s): TSH, T4TOTAL, FREET4, T3FREE, THYROIDAB in the last 72 hours. Anemia Panel: No results for input(s): VITAMINB12, FOLATE, FERRITIN, TIBC, IRON, RETICCTPCT in the last 72 hours. Urine analysis:    Component Value Date/Time   COLORURINE AMBER (A) 08/12/2023 1834   APPEARANCEUR CLOUDY (A) 08/12/2023 1834   LABSPEC 1.014 08/12/2023 1834   PHURINE 8.0 08/12/2023 1834   GLUCOSEU NEGATIVE 08/12/2023 1834   HGBUR SMALL (A) 08/12/2023 1834   BILIRUBINUR NEGATIVE 08/12/2023 1834   KETONESUR NEGATIVE 08/12/2023 1834   PROTEINUR >=300 (A) 08/12/2023 1834   NITRITE NEGATIVE 08/12/2023 1834   LEUKOCYTESUR MODERATE (A) 08/12/2023 1834   Sepsis Labs: @LABRCNTIP (procalcitonin:4,lacticidven:4)  )No results found for this or any previous visit (from the past 240 hours).       Radiology Studies: DG Chest 1 View Result Date: 12/10/2023 CLINICAL DATA:  Cough EXAM: CHEST  1 VIEW COMPARISON:  07/29/2023 FINDINGS: 2 frontal views of the chest demonstrate an unremarkable cardiac silhouette. There is patchy airspace disease within the left lower lobe consistent with bronchopneumonia. No effusion or pneumothorax. No acute bony abnormalities. IMPRESSION: 1. Patchy left lower lobe bronchopneumonia. Electronically Signed   By: Ozell Daring M.D.   On: 12/10/2023 15:21    DG Forearm Left Result Date: 12/10/2023 CLINICAL DATA:  Forearm and wrist pain EXAM: LEFT FOREARM - 2 VIEW COMPARISON:  None Available. FINDINGS: There is no evidence of fracture or other focal bone lesions. Soft tissues are unremarkable. IMPRESSION: Negative. Electronically Signed   By: Michaeline Blanch M.D.   On: 12/10/2023 13:53        Scheduled Meds:  amitriptyline   25 mg Oral QHS   atorvastatin   20 mg Oral Daily   guaiFENesin   600 mg Oral BID   heparin   5,000 Units Subcutaneous Q8H   insulin  aspart  0-5 Units Subcutaneous QHS   insulin  aspart  0-6 Units Subcutaneous TID WC   midodrine   2.5 mg Oral BID WC   pantoprazole   40 mg Oral Daily   Continuous Infusions:  azithromycin  500 mg (12/11/23 0023)   cefTRIAXone  (ROCEPHIN )  IV 2 g (12/11/23 0022)     LOS: 0 days     Devaughn KATHEE Ban, MD [facility]   If 7PM-7AM, please contact night-coverage [email or web address] Password [facility] 12/11/2023, 8:31 AM

## 2023-12-12 ENCOUNTER — Inpatient Hospital Stay

## 2023-12-12 DIAGNOSIS — Z992 Dependence on renal dialysis: Secondary | ICD-10-CM

## 2023-12-12 DIAGNOSIS — N186 End stage renal disease: Secondary | ICD-10-CM | POA: Diagnosis not present

## 2023-12-12 DIAGNOSIS — D631 Anemia in chronic kidney disease: Secondary | ICD-10-CM | POA: Diagnosis not present

## 2023-12-12 DIAGNOSIS — J189 Pneumonia, unspecified organism: Secondary | ICD-10-CM | POA: Diagnosis not present

## 2023-12-12 LAB — BASIC METABOLIC PANEL WITH GFR
Anion gap: 17 — ABNORMAL HIGH (ref 5–15)
BUN: 69 mg/dL — ABNORMAL HIGH (ref 6–20)
CO2: 23 mmol/L (ref 22–32)
Calcium: 8 mg/dL — ABNORMAL LOW (ref 8.9–10.3)
Chloride: 98 mmol/L (ref 98–111)
Creatinine, Ser: 9.93 mg/dL — ABNORMAL HIGH (ref 0.44–1.00)
GFR, Estimated: 4 mL/min — ABNORMAL LOW (ref >60)
Glucose, Bld: 177 mg/dL — ABNORMAL HIGH (ref 70–99)
Potassium: 4.8 mmol/L (ref 3.5–5.1)
Sodium: 138 mmol/L (ref 135–145)

## 2023-12-12 LAB — CBC
HCT: 24.6 % — ABNORMAL LOW (ref 36.0–46.0)
Hemoglobin: 9 g/dL — ABNORMAL LOW (ref 12.0–15.0)
MCH: 36.1 pg — ABNORMAL HIGH (ref 26.0–34.0)
MCHC: 36.6 g/dL — ABNORMAL HIGH (ref 30.0–36.0)
MCV: 98.8 fL (ref 80.0–100.0)
Platelets: 82 K/uL — ABNORMAL LOW (ref 150–400)
RBC: 2.49 MIL/uL — ABNORMAL LOW (ref 3.87–5.11)
RDW: 18.8 % — ABNORMAL HIGH (ref 11.5–15.5)
WBC: 4.6 K/uL (ref 4.0–10.5)
nRBC: 0 % (ref 0.0–0.2)

## 2023-12-12 LAB — GLUCOSE, CAPILLARY
Glucose-Capillary: 167 mg/dL — ABNORMAL HIGH (ref 70–99)
Glucose-Capillary: 152 mg/dL — ABNORMAL HIGH (ref 70–99)
Glucose-Capillary: 161 mg/dL — ABNORMAL HIGH (ref 70–99)
Glucose-Capillary: 270 mg/dL — ABNORMAL HIGH (ref 70–99)

## 2023-12-12 LAB — HEPATITIS B SURFACE ANTIGEN: Hepatitis B Surface Ag: NONREACTIVE

## 2023-12-12 MED ORDER — SALINE SPRAY 0.65 % NA SOLN
1.0000 | NASAL | Status: DC | PRN
  Administered 2023-12-12: 1 via NASAL
  Filled 2023-12-12: qty 44

## 2023-12-12 MED ORDER — MIDODRINE HCL 5 MG PO TABS
2.5000 mg | ORAL_TABLET | ORAL | Status: DC
Start: 2023-12-12 — End: 2023-12-12

## 2023-12-12 MED ORDER — TECHNETIUM TO 99M ALBUMIN AGGREGATED
4.4000 | Freq: Once | INTRAVENOUS | Status: AC | PRN
  Administered 2023-12-12: 4.4 via INTRAVENOUS

## 2023-12-12 MED ORDER — GUAIFENESIN-DM 100-10 MG/5ML PO SYRP: 5.0000 mL | ORAL_SOLUTION | ORAL | Status: DC | PRN

## 2023-12-12 MED ORDER — NYSTATIN 100000 UNIT/GM EX POWD
Freq: Three times a day (TID) | CUTANEOUS | Status: DC
  Filled 2023-12-12: qty 15

## 2023-12-12 MED ORDER — PANCRELIPASE (LIP-PROT-AMYL) 12000-38000 UNITS PO CPEP
12000.0000 [IU] | ORAL_CAPSULE | Freq: Three times a day (TID) | ORAL | Status: DC
  Administered 2023-12-12 – 2023-12-13 (×3): 12000 [IU] via ORAL
  Filled 2023-12-12 (×4): qty 1

## 2023-12-12 NOTE — TOC Initial Note (Signed)
 Transition of Care [facility]) - Initial/Assessment Note    Patient Details  Name: Tonya Myers MRN: [id number] Date of Birth: April 19, 1966  Transition of Care [facility]) [name] Contact:    Alfonso Rummer, [name] Phone Number: 12/12/2023, 3:12 PM  Clinical Narrative:                 [name] A. Lida Berkery met with patient during dialysis. Ms. Ringer reports upon discharge she wants transfer to [facility] in [place] due to on-site dialysis. Ms. Flood reports she does not like [facility] and will not go back. Ms. Funez was engaged and appropriate during the [facility] assessment. Ms. Cena reports her children reside in [place] .   Expected Discharge Plan: Skilled Nursing Facility Barriers to Discharge:  (Pt reports she lives alone and reports difficulty with ADL's)   Patient Goals and CMS Choice     Choice offered to / list presented to : Patient (Patient self reports she want to go [facility] in [place])      Expected Discharge Plan and Services       Living arrangements for the past 2 months: Single Family Home                                      Prior Living Arrangements/Services Living arrangements for the past 2 months: Single Family Home Lives with:: Self Patient language and need for interpreter reviewed:: No Do you feel safe going back to the place where you live?: No   Pt reports frequent falls, dialysis and unable to complete ADL's  Need for Family Participation in Patient Care: No (Comment) Care giver support system in place?: No (comment)   Criminal Activity/Legal Involvement Pertinent to Current Situation/Hospitalization: No - Comment as needed  Activities of Daily Living   ADL Screening (condition at time of admission) Independently performs ADLs?: Yes (appropriate for developmental age) Is the patient deaf or have difficulty hearing?: No Does the patient have difficulty seeing, even when wearing glasses/contacts?: No Does the patient have difficulty  concentrating, remembering, or making decisions?: No  Permission Sought/Granted   Permission granted to share information with : Yes, Verbal Permission Granted     Permission granted to share info w AGENCY: [facility] [place]        Emotional Assessment Appearance:: Appears stated age Attitude/Demeanor/Rapport: Engaged Affect (typically observed): Appropriate Orientation: : Oriented to Self, Oriented to Place, Oriented to  Time, Oriented to Situation   Psych Involvement: No (comment)  Admission diagnosis:  CAP (community acquired pneumonia) [J18.9] ESRD on dialysis (HCC) [N18.6, Z99.2] Left wrist pain [M25.532] Pneumonia of left lung due to infectious organism, unspecified part of lung [J18.9] Patient Active Problem List   Diagnosis Date Noted   Bilateral wrist pain 12/11/2023   CAP (community acquired pneumonia) 12/11/2023   Pneumonia of left lung due to infectious organism 12/10/2023   Orthostatic hypotension 12/10/2023   Diet-controlled diabetes mellitus (HCC) 12/10/2023   Failure to thrive in adult/frailty 08/13/2023   Left patella fracture 08/13/2023   Chest pain 07/30/2023   Near syncope 07/30/2023   ESRD (end stage renal disease) (HCC) 07/30/2023   Peripheral neuropathy 07/30/2023   Chronic diarrhea 07/25/2023   Dysarthria 03/28/2023   Uremia 03/28/2023   Stroke (HCC) 03/28/2023   Sacral decubitus ulcer, stage IV (HCC) 12/23/2021   Protein-calorie malnutrition, moderate (HCC) 12/17/2021   History of herpes genitalis 12/14/2021   Sepsis (HCC) 12/12/2021   Stercoral  colitis 12/12/2021   Closed fracture of sternum 12/12/2021   Chronic pancreatitis (HCC) 12/12/2021   Asthma 12/12/2021   Anemia in ESRD (end-stage renal disease) (HCC) 12/12/2021   UTI (urinary tract infection) 12/12/2021   Pressure injury of skin 12/11/2021   Elevated troponin 12/10/2021   Syncope 12/09/2021   Dyslipidemia 12/09/2021   Type 2 diabetes mellitus with peripheral neuropathy (HCC)  12/09/2021   GERD without esophagitis 12/09/2021   End-stage renal disease on hemodialysis (HCC) 12/09/2021   Thrombocytopenia (HCC) 12/11/2020   FUO (fever of unknown origin) 12/11/2020   Pancytopenia (HCC) 12/11/2020   Oral thrush 12/11/2020   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HCC) 12/10/2020   Right arm weakness 10/21/2020   Quadriplegia (HCC) 10/10/2020   Wheelchair dependence 10/10/2020   Transient neurologic deficit 12/15/21 10/02/2020   Left upper quadrant abdominal pain    Pain    Chronic pain of both knees    Hypokalemia    Urinary retention    Slow transit constipation    Meningitis    Labile blood glucose    Uncontrolled type 2 diabetes mellitus with hyperosmolar nonketotic hyperglycemia (HCC)    Transaminitis    Sleep disturbance    Bacterial spinal epidural abscess 06/27/2020   Obesity (BMI 30.0-34.9)    Hypomagnesemia    Hypernatremia    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HCC)    Epidural abscess    Pneumococcal meningitis    Hyperglycemic crisis in diabetes mellitus (HCC) 06/10/2020   Acute metabolic encephalopathy 06/10/2020   Severe sepsis (HCC)    Diabetic ketoacidosis without coma associated with type 2 diabetes mellitus (HCC)    AKI (acute kidney injury) (HCC)    Elevated LFTs    Acute encephalopathy 06/09/2020   Bacterial vaginosis 11/01/2016   Chronic right shoulder pain 03/23/2016   Numbness and tingling 03/23/2016   Uncontrolled type 2 diabetes mellitus with hyperglycemia, without long-term current use of insulin  (HCC) 10/27/2015   DDD (degenerative disc disease), cervical 07/29/2015   Cervical disc disorder with radiculopathy of cervical region 07/29/2015   Cervical facet syndrome 07/29/2015   DDD (degenerative disc disease), lumbar 07/29/2015   Facet syndrome, lumbar 07/29/2015   Bilateral occipital neuralgia 07/29/2015   Sacroiliac joint dysfunction 07/29/2015   DJD of shoulder 07/29/2015   Herpes simplex vulvovaginitis 05/29/2015   Acute hemorrhoid 03/11/2015   CLL  in remission (chronic lymphocytic leukemia) 01/15/2015   Hypothyroidism 12/26/2014   Arthritis 12/26/2014   Carpal tunnel syndrome, bilateral 12/26/2014   Essential hypertension 12/26/2014   Hyperlipidemia, mixed 12/26/2014   PCP:  Valora Agent, MD Pharmacy:   [facility] [address]  - [address] [place] [address] Phone: [phone] Fax: [phone]     Social Drivers of Health (SDOH) Social History: SDOH Screenings   Food Insecurity: No Food Insecurity (12/11/2023)  Housing: Low Risk  (12/11/2023)  Transportation Needs: Unmet Transportation Needs (12/11/2023)  Utilities: Not At Risk (12/11/2023)  Depression (PHQ2-9): Low Risk  (12/06/2023)  Financial Resource Strain: High Risk (07/06/2023)   Received from [facility]  Social Connections: Unknown (08/12/2023)  Tobacco Use: Low Risk  (12/10/2023)   SDOH Interventions:     Readmission Risk Interventions    12/24/2021   12:03 PM  Readmission Risk Prevention Plan  Transportation Screening Complete  Medication Review (RN Care Manager) Complete  PCP or Specialist appointment within 3-5 days of discharge Complete  HRI or Home Care Consult Complete  SW Recovery Care/Counseling Consult Complete  Palliative Care Screening Not Applicable  Skilled Nursing Facility Complete

## 2023-12-12 NOTE — Progress Notes (Addendum)
  Progress Note   Patient: Tonya Myers FMW:[id number] DOB: 07-04-1965 DOA: 12/10/2023     1 DOS: the patient was seen and examined on 12/12/2023 at 11:09AM      Brief hospital course: 58 y.o. F with ESRD on HD MWF, CLL in remission, thrombocytopenia, hx S pneumo meningitis and epidural abscess 2022, also chronic orthostasis presented with cough, found to have pneumonia.  Ortho consulted for wrist pain after a fall.  VQ scan ordered due to elevated d-dimer.     Assessment and Plan: *Community-acquired pneumonia left lower lobe Still febrile in the last 24 hours.  Strep pneumo urinary antigen negative.  RVP and COVID-negative. - Follow VQ scan - Continue Rocephin  and azithromycin    Bilateral wrist pain Fall onto outstretched arms 12/08/2023 Difficulty ambulating with walker due to wrist pain Evaluated by orthopedics.  No gross deformity, x-rays negative.  There was some concern for gout, and urate normal but low NPV, so steroids started. CRP elevated, nonspecific. 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  Telmisartan  stopped in started on midodrine  PRN last April.  Hasn't needed midodrine  in over a month. - Midodrine  PRN on HD days  Failure to thrive in adult/frailty Peripheral neuropathy-ambulate with walker History of meningitis/epidural abscess s/p multilevel laminectomy -PT/OT  End-stage renal disease on hemodialysis (HCC) -Consult nephrology for routine HD  Chronic diarrhea No change - Continue Protonix  and Creon   Diet-controlled diabetes mellitus (HCC) with neuropathy Hyperlipidemia Glucoses normal -Continue amitriptyline  -Continue Lipitor  Hypertension BP elevated - Continue  HCTZ       Subjective: Patient has severe wrist pain, but overall this seems to be improving.  She had a fever overnight.  Her cough is improving.     Physical Exam: BP (!) 151/92   Pulse 74   Temp 98.3 F (36.8 C)   Resp 18   Ht 6' (1.829 m)   Wt 91.6 kg   SpO2 99%   BMI 27.40 kg/m   Adult female, sitting up in bed, interactive and appropriate RRR, no murmurs, no peripheral edema Respiratory rate normal, lungs clear, overall diminished but no rales appreciated Abdomen soft, no tenderness palpation Bilateral wrists appear normal, although she guards them, and moves them very slowly and appears to have pain with range of motion Attention normal, affect normal, judgment and insight appear normal    Data Reviewed: Basic metabolic panel consistent with end-stage renal disease CBC with stable thrombocytopenia, anemia   Family Communication: 2 family members at the bedside    Disposition: Status is: Inpatient Patient was admitted after a fall, found to have pneumonia  Because of wrist pain, physical therapy recommending SNF  She will complete a VQ scan today, and if this is normal, and if she remains afebrile for the next 24 hours, we will likely discharge tomorrow to SNF        Author: Lonni SHAUNNA Dalton, MD 12/12/2023 2:03 PM  For on call review [email or web address].

## 2023-12-12 NOTE — Hospital Course (Addendum)
 58 y.o. F with ESRD on HD MWF, CLL in remission, thrombocytopenia, hx S pneumo meningitis and epidural abscess 2022, also chronic orthostasis presented with cough, found to have pneumonia.  Ortho consulted for wrist pain after a fall.  VQ scan ordered due to elevated d-dimer.

## 2023-12-12 NOTE — Progress Notes (Signed)
 VQ scan obtained and low probability of PE.

## 2023-12-12 NOTE — [place] FL2 (Signed)
   MEDICAID FL2 LEVEL OF CARE FORM     IDENTIFICATION  Patient Name: Tonya Myers Birthdate: 1965-09-28 Sex: female Admission Date (Current Location): 12/10/2023  [facility] and [place] Number:  Chiropodist and Address:  [facility], [address], [place], [place] [address]      Provider Number: [id number]  Attending Physician Name and Address:  Jonel Lonni SQUIBB, *  Relative Name and Phone Number:       Current Level of Care: Hospital Recommended Level of Care: Skilled Nursing Facility Prior Approval Number:    Date Approved/Denied:   PASRR Number: [id number]  Discharge Plan: SNF    Current Diagnoses: Patient Active Problem List   Diagnosis Date Noted   Bilateral wrist pain 12/11/2023   CAP (community acquired pneumonia) 12/11/2023   Pneumonia of left lung due to infectious organism 12/10/2023   Orthostatic hypotension 12/10/2023   Diet-controlled diabetes mellitus (HCC) 12/10/2023   Failure to thrive in adult/frailty 08/13/2023   Left patella fracture 08/13/2023   Chest pain 07/30/2023   Near syncope 07/30/2023   ESRD (end stage renal disease) (HCC) 07/30/2023   Peripheral neuropathy 07/30/2023   Chronic diarrhea 07/25/2023   Dysarthria 03/28/2023   Uremia 03/28/2023   Stroke (HCC) 03/28/2023   Sacral decubitus ulcer, stage IV (HCC) 12/23/2021   Protein-calorie malnutrition, moderate (HCC) 12/17/2021   History of herpes genitalis 12/14/2021   Sepsis (HCC) 12/12/2021   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   Closed fracture of sternum 12/12/2021   Chronic pancreatitis (HCC) 12/12/2021   Asthma 12/12/2021   Anemia in ESRD (end-stage renal disease) (HCC) 12/12/2021   UTI (urinary tract infection) 12/12/2021   Pressure injury of skin 12/11/2021   Elevated troponin 12/10/2021   Syncope 12/09/2021   Dyslipidemia 12/09/2021   Type 2 diabetes mellitus with peripheral neuropathy (HCC) 12/09/2021   GERD without  esophagitis 12/09/2021   End-stage renal disease on hemodialysis (HCC) 12/09/2021   Thrombocytopenia (HCC) 12/11/2020   FUO (fever of unknown origin) 12/11/2020   Pancytopenia (HCC) 12/11/2020   Oral thrush 12/11/2020   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HCC) 12/10/2020   Right arm weakness 10/21/2020   Quadriplegia (HCC) 10/10/2020   Wheelchair dependence 10/10/2020   Transient neurologic deficit 12/15/21 10/02/2020   Left upper quadrant abdominal pain    Pain    Chronic pain of both knees    Hypokalemia    Urinary retention    Slow transit constipation    Meningitis    Labile blood glucose    Uncontrolled type 2 diabetes mellitus with hyperosmolar nonketotic hyperglycemia (HCC)    Transaminitis    Sleep disturbance    Bacterial spinal epidural abscess 06/27/2020   Obesity (BMI 30.0-34.9)    Hypomagnesemia    Hypernatremia    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HCC)    Epidural abscess    Pneumococcal meningitis    Hyperglycemic crisis in diabetes mellitus (HCC) 06/10/2020   Acute metabolic encephalopathy 06/10/2020   Severe sepsis (HCC)    Diabetic ketoacidosis without coma associated with type 2 diabetes mellitus (HCC)    AKI (acute kidney injury) (HCC)    Elevated LFTs    Acute encephalopathy 06/09/2020   Bacterial vaginosis 11/01/2016   Chronic right shoulder pain 03/23/2016   Numbness and tingling 03/23/2016   Uncontrolled type 2 diabetes mellitus with hyperglycemia, without long-term current use of insulin  (HCC) 10/27/2015   DDD (degenerative disc disease), cervical 07/29/2015   Cervical disc disorder with radiculopathy of cervical region 07/29/2015   Cervical facet syndrome  07/29/2015   DDD (degenerative disc disease), lumbar 07/29/2015   Facet syndrome, lumbar 07/29/2015   Bilateral occipital neuralgia 07/29/2015   Sacroiliac joint dysfunction 07/29/2015   DJD of shoulder 07/29/2015   Herpes simplex vulvovaginitis 05/29/2015   Acute hemorrhoid 03/11/2015   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) 01/15/2015   Hypothyroidism 12/26/2014   Arthritis 12/26/2014   Carpal tunnel syndrome, bilateral 12/26/2014   Essential hypertension 12/26/2014   Hyperlipidemia, mixed 12/26/2014    Orientation RESPIRATION BLADDER Height & Weight     Self, Time, Situation, Place  Normal   Weight: 202 lb (91.6 kg) Height:  6' (182.9 cm)  BEHAVIORAL SYMPTOMS/MOOD NEUROLOGICAL BOWEL NUTRITION STATUS           AMBULATORY STATUS COMMUNICATION OF NEEDS Skin       Normal                       Personal Care Assistance Level of Assistance  Bathing, Dressing Bathing Assistance: Limited assistance   Dressing Assistance: Limited assistance     Functional Limitations Info             SPECIAL CARE FACTORS FREQUENCY                       Contractures      Additional Factors Info                  Current Medications (12/12/2023):  This is the current hospital active medication list Current Facility-Administered Medications  Medication Dose Route Frequency Provider Last Rate Last Admin   acetaminophen  (TYLENOL ) tablet 650 mg  650 mg Oral Q6H PRN Duncan, Hazel V, MD       Or   acetaminophen  (TYLENOL ) suppository 650 mg  650 mg Rectal Q6H PRN Duncan, Hazel V, MD       albuterol  (PROVENTIL ) (2.5 MG/3ML) 0.083% nebulizer solution 2.5 mg  2.5 mg Nebulization Q2H PRN Duncan, Hazel V, MD       amitriptyline  (ELAVIL ) tablet 25 mg  25 mg Oral QHS Duncan, Hazel V, MD   25 mg at 12/11/23 2113   atorvastatin  (LIPITOR) tablet 20 mg  20 mg Oral Daily Duncan, Hazel V, MD   20 mg at 12/11/23 2113   azithromycin  (ZITHROMAX ) tablet 500 mg  500 mg Oral Daily Kandis Devaughn Sayres, MD   500 mg at 12/11/23 2113   cefTRIAXone  (ROCEPHIN ) 2 g in sodium chloride  0.9 % 100 mL IVPB  2 g Intravenous Q24H Duncan, Hazel V, MD 200 mL/hr at 12/11/23 2124 2 g at 12/11/23 2124   Chlorhexidine  Gluconate Cloth 2 % PADS 6 each  6 each Topical Q0600 Korrapati, Madhu, MD   6 each at 12/12/23 0552    guaiFENesin  (MUCINEX ) 12 hr tablet 600 mg  600 mg Oral BID Duncan, Hazel V, MD   600 mg at 12/12/23 9157   guaiFENesin -dextromethorphan  (ROBITUSSIN DM) 100-10 MG/5ML syrup 5 mL  5 mL Oral Q4H PRN Kandis Devaughn Sayres, MD       heparin  injection 5,000 Units  5,000 Units Subcutaneous Q8H Duncan, Hazel V, MD   5,000 Units at 12/12/23 9475   hydrochlorothiazide  (HYDRODIURIL ) tablet 12.5 mg  12.5 mg Oral Daily Kandis Devaughn Sayres, MD   12.5 mg at 12/11/23 1253   HYDROmorphone  (DILAUDID ) injection 1 mg  1 mg Intravenous Q4H PRN Kandis Devaughn Sayres, MD   1 mg at 12/12/23 1220   hydrOXYzine  (ATARAX ) tablet 25 mg  25 mg Oral TID PRN Duncan, Hazel V, MD   25 mg at 12/12/23 1334   insulin  aspart (novoLOG ) injection 0-5 Units  0-5 Units Subcutaneous QHS Duncan, Hazel V, MD       insulin  aspart (novoLOG ) injection 0-6 Units  0-6 Units Subcutaneous TID WC Duncan, Hazel V, MD   1 Units at 12/12/23 1220   levothyroxine  (SYNTHROID ) tablet 125 mcg  125 mcg Oral Once per day on Monday Tuesday Wednesday Thursday Friday Saturday Sharilyn Estill HERO, RPH   125 mcg at 12/12/23 9475   And   levothyroxine  (SYNTHROID ) tablet 187.5 mcg  187.5 mcg Oral Every Sunday Hallaji, Sheema M, RPH   187.5 mcg at 12/11/23 9052   lipase/protease/amylase (CREON ) capsule 12,000 Units  12,000 Units Oral TID AC Danford, Lonni SQUIBB, MD       ondansetron  (ZOFRAN ) tablet 4 mg  4 mg Oral Q6H PRN Cleatus Delayne GAILS, MD       Or   ondansetron  (ZOFRAN ) injection 4 mg  4 mg Intravenous Q6H PRN Duncan, Hazel V, MD   4 mg at 12/11/23 0050   oxyCODONE  (Oxy IR/ROXICODONE ) immediate release tablet 5 mg  5 mg Oral Q6H PRN Kandis Devaughn Sayres, MD   5 mg at 12/11/23 1718   pantoprazole  (PROTONIX ) EC tablet 40 mg  40 mg Oral Daily Duncan, Hazel V, MD   40 mg at 12/12/23 9157   predniSONE  (DELTASONE ) tablet 40 mg  40 mg Oral Q breakfast Kandis Devaughn Sayres, MD   40 mg at 12/12/23 9157   traMADol  (ULTRAM ) tablet 50 mg  50 mg Oral Q12H PRN Duncan, Hazel V, MD   50 mg  at 12/11/23 9246     Discharge Medications: Please see discharge summary for a list of discharge medications.  Relevant Imaging Results:  Relevant Lab Results:   Additional Information    Alfonso Rummer, [name]

## 2023-12-12 NOTE — Progress Notes (Signed)
 Referring Provider: No ref. provider found Primary Care Physician:  Valora Agent, MD Primary Nephrologist:  Dr.   [place] for Consultation: ESRD  HPI: 58 year old female with a past medical history of end-stage renal disease on hemodialysis Monday Wednesday Friday schedule, ANCA related glomerulonephritis, diet-controlled diabetes, orthostatic hypotension and thrombocytopenia and CLL now admitted with history of chest congestion, cough and shortness of breath.    Update: Patient sitting up in bed Alert and oriented Denies pain Room air without shortness of breath  Past Medical History:  Diagnosis Date   Acute hemorrhoid 03/11/2015   Anxiety    Arthritis    Asthma    Cancer (HCC)    Chronic back pain    CLL (chronic lymphocytic leukemia) (HCC)    Depression    Diabetes mellitus without complication (HCC)    Genital herpes    type 2   Hypertension    Hypothyroidism    Renal disorder    Vertigo     Past Surgical History:  Procedure Laterality Date   ABDOMINAL HYSTERECTOMY     BILATERAL SALPINGOOPHORECTOMY  2009   BREAST BIOPSY Right 05/17/2016   FIBROADENOMATOUS CHANGE AND SCLEROSING ADENOSIS WITH   COLONOSCOPY WITH PROPOFOL  N/A 06/16/2015   Procedure: COLONOSCOPY WITH PROPOFOL ;  Surgeon: Donnice Vaughn Manes, MD;  Location: [facility] [place] ENDOSCOPY;  Service: Endoscopy;  Laterality: N/A;   DIALYSIS/PERMA CATHETER INSERTION N/A 12/17/2020   Procedure: DIALYSIS/PERMA CATHETER INSERTION;  Surgeon: Marea Selinda RAMAN, MD;  Location: [facility] INVASIVE CV LAB;  Service: Cardiovascular;  Laterality: N/A;   LAPAROSCOPIC SUPRACERVICAL HYSTERECTOMY  2009   due to leio   OOPHORECTOMY     TEMPORARY DIALYSIS CATHETER N/A 12/10/2020   Procedure: TEMPORARY DIALYSIS CATHETER;  Surgeon: Jama Cordella MATSU, MD;  Location: [facility] INVASIVE CV LAB;  Service: Cardiovascular;  Laterality: N/A;   THORACIC LAMINECTOMY FOR EPIDURAL ABSCESS Bilateral 06/17/2020   Procedure: THORACIC LAMINECTOMY FOR EPIDURAL ABSCESS;   Surgeon: Bluford Standing, MD;  Location: [facility] ORS;  Service: Neurosurgery;  Laterality: Bilateral;    Prior to Admission medications   Medication Sig Start Date End Date Taking? Authorizing Provider  acetaminophen  (TYLENOL ) 325 MG tablet Take 2 tablets (650 mg total) by mouth every 6 (six) hours as needed for mild pain (or Fever >/= 101). 07/30/20  Yes Angiulli, Toribio PARAS, PA-C  albuterol  (VENTOLIN  HFA) 108 (90 Base) MCG/ACT inhaler Inhale 2 puffs into the lungs every 6 (six) hours as needed for wheezing or shortness of breath. 10/18/21  Yes [provider]  amitriptyline  (ELAVIL ) 25 MG tablet Take 1 tablet (25 mg total) by mouth at bedtime. 12/06/23  Yes Marcelino Nurse, MD  atorvastatin  (LIPITOR) 20 MG tablet Take 20 mg by mouth daily. 10/18/21  Yes [provider]  cetirizine (ZYRTEC) 10 MG tablet Take 1 tablet by mouth daily. 07/07/23  Yes [provider]  fluticasone  (FLONASE ) 50 MCG/ACT nasal spray Place 2 sprays into both nostrils daily.   Yes [provider]  folic acid  (FOLVITE ) 1 MG tablet Take 1 tablet by mouth daily. 10/18/21  Yes [provider]  hydrOXYzine  (ATARAX ) 25 MG tablet Take 1 tablet (25 mg total) by mouth 3 (three) times daily as needed for itching. 08/19/23  Yes Von Bellis, MD  levothyroxine  (SYNTHROID ) 125 MCG tablet Take 125 mcg by mouth daily. Take 1 tablet daily except Sunday take 1 and 1/2 tablet (187.5 mg). 12/08/21  Yes [provider]  lipase/protease/amylase (CREON ) 12000-38000 units CPEP capsule Take 1 capsule by mouth. 09/06/23  Yes [provider]  midodrine  (PROAMATINE ) 2.5 MG tablet Take 2.5 mg by mouth as directed. Only on dialysis days. (Monday Wednesday and Friday) 11/24/23  Yes [provider]  multivitamin (RENA-VIT) TABS tablet Take 1 tablet by mouth at bedtime. 12/22/21  Yes Jhonny Calvin NOVAK, MD  ondansetron  (ZOFRAN -ODT) 8 MG disintegrating tablet Take 1 tablet (8 mg total) by mouth every 8  (eight) hours as needed for nausea or vomiting. 11/04/23  Yes Jacobo Evalene PARAS, MD  pantoprazole  (PROTONIX ) 40 MG tablet Take 1 tablet (40 mg total) by mouth 2 (two) times daily for 28 days, THEN 1 tablet (40 mg total) daily. 08/19/23 06/11/26 Yes Von Bellis, MD  promethazine  (PHENERGAN ) 12.5 MG tablet Take 1 tablet (12.5 mg total) by mouth every 6 (six) hours as needed. 11/06/23  Yes Willo Dunnings, MD  traMADol  (ULTRAM ) 50 MG tablet Take 1 tablet (50 mg total) by mouth every 8 (eight) hours as needed. Patient taking differently: Take 50 mg by mouth at bedtime. 09/01/23  Yes Jacobo Evalene PARAS, MD  zinc  sulfate 220 (50 Zn) MG capsule Take 1 capsule (220 mg total) by mouth daily. 12/23/21  Yes Sreenath, Sudheer B, MD  Blood Glucose Monitoring Suppl DEVI 1 each by Does not apply route 3 (three) times daily. May dispense any manufacturer covered by patient's insurance. 08/19/23   Von Bellis, MD  calcium  acetate (PHOSLO ) 667 MG capsule Take 1,334 mg by mouth 3 (three) times daily with meals. Patient not taking: Reported on 12/10/2023 05/11/23 05/10/24  [provider]  Glucose Blood (BLOOD GLUCOSE TEST STRIPS) STRP 1 each by Does not apply route 3 (three) times daily. Use as directed to check blood sugar. May dispense any manufacturer covered by patient's insurance and fits patient's device. 08/19/23   Von Bellis, MD  Lancet Device MISC 1 each by Does not apply route 3 (three) times daily. May dispense any manufacturer covered by patient's insurance. 08/19/23   Von Bellis, MD  Lancets MISC 1 each by Does not apply route 3 (three) times daily. Use as directed to check blood sugar. May dispense any manufacturer covered by patient's insurance and fits patient's device. 08/19/23   Von Bellis, MD  valACYclovir  (VALTREX ) 500 MG tablet Take 500 mg by mouth as directed. Take 1 tablet daily on dialysis days.(Monday, Wednesday and Friday)    [provider]  budesonide-formoterol (SYMBICORT)  160-4.5 MCG/ACT inhaler Inhale into the lungs. 07/14/18 03/15/19  [provider]    Current Facility-Administered Medications  Medication Dose Route Frequency Provider Last Rate Last Admin   acetaminophen  (TYLENOL ) tablet 650 mg  650 mg Oral Q6H PRN Duncan, Hazel V, MD       Or   acetaminophen  (TYLENOL ) suppository 650 mg  650 mg Rectal Q6H PRN Duncan, Hazel V, MD       albuterol  (PROVENTIL ) (2.5 MG/3ML) 0.083% nebulizer solution 2.5 mg  2.5 mg Nebulization Q2H PRN Duncan, Hazel V, MD       amitriptyline  (ELAVIL ) tablet 25 mg  25 mg Oral QHS Duncan, Hazel V, MD   25 mg at 12/11/23 2113   atorvastatin  (LIPITOR) tablet 20 mg  20 mg Oral Daily Duncan, Hazel V, MD   20 mg at 12/11/23 2113   azithromycin  (ZITHROMAX ) tablet 500 mg  500 mg Oral Daily Kandis Devaughn Sayres, MD   500 mg at 12/11/23 2113   cefTRIAXone  (ROCEPHIN ) 2 g in sodium chloride  0.9 % 100 mL IVPB  2 g Intravenous Q24H Duncan, Hazel V, MD 200  mL/hr at 12/11/23 2124 2 g at 12/11/23 2124   Chlorhexidine  Gluconate Cloth 2 % PADS 6 each  6 each Topical Q0600 Dominica Brandy, MD   6 each at 12/12/23 9447   guaiFENesin  (MUCINEX ) 12 hr tablet 600 mg  600 mg Oral BID Duncan, Hazel V, MD   600 mg at 12/12/23 9157   guaiFENesin -dextromethorphan  (ROBITUSSIN DM) 100-10 MG/5ML syrup 5 mL  5 mL Oral Q4H PRN Kandis Devaughn Sayres, MD       heparin  injection 5,000 Units  5,000 Units Subcutaneous Q8H Duncan, Hazel V, MD   5,000 Units at 12/12/23 9475   hydrochlorothiazide  (HYDRODIURIL ) tablet 12.5 mg  12.5 mg Oral Daily Kandis Devaughn Sayres, MD   12.5 mg at 12/11/23 1253   HYDROmorphone  (DILAUDID ) injection 1 mg  1 mg Intravenous Q4H PRN Kandis Devaughn Sayres, MD   1 mg at 12/12/23 1220   hydrOXYzine  (ATARAX ) tablet 25 mg  25 mg Oral TID PRN Duncan, Hazel V, MD   25 mg at 12/12/23 9473   insulin  aspart (novoLOG ) injection 0-5 Units  0-5 Units Subcutaneous QHS Cleatus Delayne GAILS, MD       insulin  aspart (novoLOG ) injection 0-6 Units  0-6 Units  Subcutaneous TID WC Duncan, Hazel V, MD   1 Units at 12/12/23 1220   levothyroxine  (SYNTHROID ) tablet 125 mcg  125 mcg Oral Once per day on Monday Tuesday Wednesday Thursday Friday Saturday Sharilyn Estill HERO, [place]   125 mcg at 12/12/23 9475   And   levothyroxine  (SYNTHROID ) tablet 187.5 mcg  187.5 mcg Oral Every Sunday Hallaji, Sheema M, RPH   187.5 mcg at 12/11/23 9052   ondansetron  (ZOFRAN ) tablet 4 mg  4 mg Oral Q6H PRN Duncan, Hazel V, MD       Or   ondansetron  (ZOFRAN ) injection 4 mg  4 mg Intravenous Q6H PRN Duncan, Hazel V, MD   4 mg at 12/11/23 0050   oxyCODONE  (Oxy IR/ROXICODONE ) immediate release tablet 5 mg  5 mg Oral Q6H PRN Kandis Devaughn Sayres, MD   5 mg at 12/11/23 1718   pantoprazole  (PROTONIX ) EC tablet 40 mg  40 mg Oral Daily Duncan, Hazel V, MD   40 mg at 12/12/23 9157   predniSONE  (DELTASONE ) tablet 40 mg  40 mg Oral Q breakfast Kandis Devaughn Sayres, MD   40 mg at 12/12/23 9157   traMADol  (ULTRAM ) tablet 50 mg  50 mg Oral Q12H PRN Duncan, Hazel V, MD   50 mg at 12/11/23 0753    Allergies as of 12/10/2023 - Review Complete 12/10/2023  Allergen Reaction Noted   Amoxapine  11/04/2020   Latex Other (See Comments) 11/22/2022   Ceftazidime  04/27/2021   Iodinated contrast media Hives and Rash 11/22/2022   Penicillins  11/04/2020    Family History  Problem Relation Age of Onset   Cancer Paternal Aunt    Breast cancer Maternal Aunt 57    Social History   Socioeconomic History   Marital status: Married    Spouse name: Not on file   Number of children: Not on file   Years of education: Not on file   Highest education level: Not on file  Occupational History   Not on file  Tobacco Use   Smoking status: Never   Smokeless tobacco: Never  Vaping Use   Vaping status: Never Used  Substance and Sexual Activity   Alcohol use: Not Currently    Alcohol/week: 1.0 standard drink of alcohol    Types: 1  Cans of beer per week   Drug use: No   Sexual activity: Not Currently     Birth control/protection: None  Other Topics Concern   Not on file  Social History Narrative   Not on file   Social Drivers of Health   Financial Resource Strain: High Risk (07/06/2023)   Received [facility]   Overall Financial Resource Strain (CARDIA)    Difficulty of Paying Living Expenses: Hard  Food Insecurity: No Food Insecurity (12/11/2023)   Hunger Vital Sign    Worried About Running Out of Food in the Last Year: Never true    Ran Out of Food in the Last Year: Never true  Transportation Needs: Unmet Transportation Needs (12/11/2023)   PRAPARE - Administrator, Civil Service (Medical): Yes    Lack of Transportation (Non-Medical): Yes  Physical Activity: Not on file  Stress: Not on file  Social Connections: Unknown (08/12/2023)   Social Connection and Isolation Panel    Frequency of Communication with Friends and Family: More than three times a week    Frequency of Social Gatherings with Friends and Family: More than three times a week    Attends Religious Services: More than 4 times per year    Active Member of [employer] or Organizations: Patient declined    Attends Banker Meetings: Patient declined    Marital Status: Not on file  Intimate Partner Violence: Not At Risk (12/11/2023)   Humiliation, Afraid, Rape, and Kick questionnaire    Fear of Current or Ex-Partner: No    Emotionally Abused: No    Physically Abused: No    Sexually Abused: No    Physical Exam: Vital signs in last 24 hours: Temp:  [98.3 F (36.8 C)-100.6 F (38.1 C)] 98.3 F (36.8 C) (08/18 0819) Pulse Rate:  [76-95] 76 (08/18 0819) Resp:  [15-17] 17 (08/18 0819) BP: (148-169)/(75-86) 148/78 (08/18 0819) SpO2:  [95 %-98 %] 95 % (08/18 0819) Last BM Date : 12/09/23  General:   Alert,  Well-developed, well-nourished, pleasant and cooperative in NAD Head:  Normocephalic and atraumatic. Eyes:  Sclera clear, no icterus.   Conjunctiva pink. Ears:  Normal  auditory acuity. Nose:  No deformity, discharge,  or lesions. Lungs: Diminished in bases. Heart:  Regular rate and rhythm; no murmurs, clicks, rubs,  or gallops. Abdomen:  Soft, nontender and nondistended. No masses, hepatosplenomegaly or hernias noted. Normal bowel sounds, without guarding, and without rebound.   Extremities:  Without clubbing or edema.  Access Rt AVF  Intake/Output from previous day: No intake/output data recorded. Intake/Output this shift: No intake/output data recorded.  Lab Results: Recent Labs    12/10/23 1404 12/11/23 0424 12/12/23 0554  WBC 5.7 3.8* 4.6  HGB 9.4* 8.8* 9.0*  HCT 25.7* 28.1* 24.6*  PLT 78* 70* 82*   BMET Recent Labs    12/10/23 1404 12/12/23 0554  NA 137 138  K 3.9 4.8  CL 96* 98  CO2 25 23  GLUCOSE 124* 177*  BUN 45* 69*  CREATININE 6.99* 9.93*  CALCIUM  8.5* 8.0*   LFT Recent Labs    12/10/23 1404  PROT 7.3  ALBUMIN  3.1*  AST 41  ALT 8  ALKPHOS 60  BILITOT 0.9   PT/INR No results for input(s): LABPROT, INR in the last 72 hours. Hepatitis Panel Recent Labs    12/12/23 0554  HEPBSAG NON REACTIVE    Studies/Results: DG Chest 2 View Result Date: 12/12/2023 CLINICAL DATA:  Pulmonary  embolism. EXAM: CHEST - 2 VIEW COMPARISON:  12/10/2023 and CT chest 12/12/2021. FINDINGS: Trachea is midline. Heart is enlarged, stable. There may be minimal bibasilar interstitial prominence, left greater than right. Small bilateral effusions. IMPRESSION: Suspect mild congestive heart failure. Electronically Signed   By: Newell Eke M.D.   On: 12/12/2023 11:00   DG Wrist 2 Views Left Result Date: 12/11/2023 CLINICAL DATA:  Bilateral wrist pain EXAM: LEFT WRIST - 2 VIEW; RIGHT WRIST - 2 VIEW COMPARISON:  12/10/2023 FINDINGS: Left wrist: Frontal and lateral views are obtained. No acute fracture, subluxation, or dislocation. Mild joint space narrowing and osteophyte formation at the first carpometacarpal joint. Remaining joint spaces  are well preserved. Moderate subcutaneous edema throughout the distal left forearm, wrist, and base of the hand. No subcutaneous gas. Right wrist: Frontal and lateral views are obtained. No fracture, subluxation, or dislocation. Joint spaces are well preserved. Soft tissues are unremarkable. IMPRESSION: Left wrist: 1. No acute fracture. 2. Diffuse soft tissue swelling from the left forearm through the base of the hand, which could reflect cellulitis. 3. 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 Right wrist: 1. No acute fracture.  Unremarkable exam. Electronically Signed   By: Ozell Daring M.D.   On: 12/11/2023 11:29   DG Wrist 2 Views Right Result Date: 12/11/2023 CLINICAL DATA:  Bilateral wrist pain EXAM: LEFT WRIST - 2 VIEW; RIGHT WRIST - 2 VIEW COMPARISON:  12/10/2023 FINDINGS: Left wrist: Frontal and lateral views are obtained. No acute fracture, subluxation, or dislocation. Mild joint space narrowing and osteophyte formation at the first carpometacarpal joint. Remaining joint spaces are well preserved. Moderate subcutaneous edema throughout the distal left forearm, wrist, and base of the hand. No subcutaneous gas. Right wrist: Frontal and lateral views are obtained. No fracture, subluxation, or dislocation. Joint spaces are well preserved. Soft tissues are unremarkable. IMPRESSION: Left wrist: 1. No acute fracture. 2. Diffuse soft tissue swelling from the left forearm through the base of the hand, which could reflect cellulitis. 3. 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 Right wrist: 1. No acute fracture.  Unremarkable exam. Electronically Signed   By: Ozell Daring M.D.   On: 12/11/2023 11:29   DG Chest 1 View Result Date: 12/10/2023 CLINICAL DATA:  Cough EXAM: CHEST  1 VIEW COMPARISON:  07/29/2023 FINDINGS: 2 frontal views of the chest demonstrate an unremarkable cardiac silhouette. There is patchy airspace disease within the left lower lobe consistent with  bronchopneumonia. No effusion or pneumothorax. No acute bony abnormalities. IMPRESSION: 1. Patchy left lower lobe bronchopneumonia. Electronically Signed   By: Ozell Daring M.D.   On: 12/10/2023 15:21   DG Forearm Left Result Date: 12/10/2023 CLINICAL DATA:  Forearm and wrist pain EXAM: LEFT FOREARM - 2 VIEW COMPARISON:  None Available. FINDINGS: There is no evidence of fracture or other focal bone lesions. Soft tissues are unremarkable. IMPRESSION: Negative. Electronically Signed   By: Michaeline Blanch M.D.   On: 12/10/2023 13:53    Assessment/Plan:  58 year old female with a past medical history of end-stage renal disease on hemodialysis Monday Wednesday Friday schedule, ANCA related glomerulonephritis, diet-controlled diabetes, orthostatic hypotension and thrombocytopenia and CLL now admitted with history of chest congestion, cough and shortness of breath.  She is now being treated with antibiotics for pneumonia.  [facility]/MWF/Rt AVF  ESRD on hemodialysis: Scheduled to receive dialysis today, UF goal 1.5L as tolerated. Next treatment scheduled for Wednesday  ANEMIA with chronic kidney disease: Hgb borderline, 9.0. Will consider low dose EPO.  Secondary Hyperparathyroidism: with outpatient labs: PTH 568, phosphorus 5.1, calcium  9.0 on 11/28/23  Lab Results  Component Value Date   CALCIUM  8.0 (L) 12/12/2023   PHOS 5.2 (H) 08/19/2023   Currently prescribed calcitriol and calcium  acetate outpatient. Will continue to monitor bone minerals.   Hypertension with chronic kidney disease. Home regimen includes midodrine , currently held. Receiving hydrochlorothiazide  during this admission.   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 Most recent hemoglobin A1c is 5.6 on 07/29/23.       CAP, chest xray shows left lower lobe bronchopneumonia. Primary team has ordered Rocephin  and Azithromycin . Elevated D-dimer, VQ scan ordered.     LOS:  1 Faith Harris, MD [facility] @TODAY @12 :47 PM

## 2023-12-12 NOTE — Progress Notes (Signed)
 OT Cancellation Note  Patient Details Name: Tonya Myers MRN: [id number] DOB: 1965-05-07   Cancelled Treatment:    Reason Eval/Treat Not Completed: Patient at procedure or test/ unavailable. Pt at dialysis, will re-attempt OT tx at later date/time as pt is available.   Dorsey Authement R., MPH, MS, OTR/L ascom [phone] 12/12/23, 1:13 PM

## 2023-12-13 DIAGNOSIS — M25531 Pain in right wrist: Secondary | ICD-10-CM | POA: Diagnosis not present

## 2023-12-13 DIAGNOSIS — J189 Pneumonia, unspecified organism: Secondary | ICD-10-CM | POA: Diagnosis not present

## 2023-12-13 DIAGNOSIS — E039 Hypothyroidism, unspecified: Secondary | ICD-10-CM

## 2023-12-13 DIAGNOSIS — M25532 Pain in left wrist: Secondary | ICD-10-CM

## 2023-12-13 DIAGNOSIS — N186 End stage renal disease: Secondary | ICD-10-CM | POA: Diagnosis not present

## 2023-12-13 LAB — BASIC METABOLIC PANEL WITH GFR
Anion gap: 12 (ref 5–15)
BUN: 59 mg/dL — ABNORMAL HIGH (ref 6–20)
CO2: 27 mmol/L (ref 22–32)
Calcium: 8.2 mg/dL — ABNORMAL LOW (ref 8.9–10.3)
Chloride: 96 mmol/L — ABNORMAL LOW (ref 98–111)
Creatinine, Ser: 6.91 mg/dL — ABNORMAL HIGH (ref 0.44–1.00)
GFR, Estimated: 6 mL/min — ABNORMAL LOW (ref >60)
Glucose, Bld: 260 mg/dL — ABNORMAL HIGH (ref 70–99)
Potassium: 4.8 mmol/L (ref 3.5–5.1)
Sodium: 135 mmol/L (ref 135–145)

## 2023-12-13 LAB — CBC
HCT: 23.6 % — ABNORMAL LOW (ref 36.0–46.0)
Hemoglobin: 8.5 g/dL — ABNORMAL LOW (ref 12.0–15.0)
MCH: 34.6 pg — ABNORMAL HIGH (ref 26.0–34.0)
MCHC: 36 g/dL (ref 30.0–36.0)
MCV: 95.9 fL (ref 80.0–100.0)
Platelets: 108 K/uL — ABNORMAL LOW (ref 150–400)
RBC: 2.46 MIL/uL — ABNORMAL LOW (ref 3.87–5.11)
RDW: 19.5 % — ABNORMAL HIGH (ref 11.5–15.5)
WBC: 4 K/uL (ref 4.0–10.5)
nRBC: 0.5 % — ABNORMAL HIGH (ref 0.0–0.2)

## 2023-12-13 LAB — GLUCOSE, CAPILLARY
Glucose-Capillary: 217 mg/dL — ABNORMAL HIGH (ref 70–99)
Glucose-Capillary: 184 mg/dL — ABNORMAL HIGH (ref 70–99)

## 2023-12-13 LAB — ENA+DNA/DS+SJORGEN'S
ENA SM Ab Ser-aCnc: 3.6 AI — ABNORMAL HIGH (ref 0.0–0.9)
Ribonucleic Protein: 0.9 AI (ref 0.0–0.9)
SSA (Ro) (ENA) Antibody, IgG: >8 AI — ABNORMAL HIGH (ref 0.0–0.9)
SSB (La) (ENA) Antibody, IgG: <0.2 AI (ref 0.0–0.9)
ds DNA Ab: >300 [IU]/mL — ABNORMAL HIGH (ref 0–9)

## 2023-12-13 LAB — RHEUMATOID FACTOR: Rheumatoid fact SerPl-aCnc: 19.7 [IU]/mL — ABNORMAL HIGH (ref <14.0)

## 2023-12-13 LAB — HEPATITIS B SURFACE ANTIBODY, QUANTITATIVE: Hep B S AB Quant (Post): <3.5 m[IU]/mL — ABNORMAL LOW

## 2023-12-13 LAB — ANA W/REFLEX: Anti Nuclear Antibody (ANA): POSITIVE — AB

## 2023-12-13 LAB — LEGIONELLA PNEUMOPHILA SEROGP 1 UR AG: L. pneumophila Serogp 1 Ur Ag: NEGATIVE

## 2023-12-13 MED ORDER — GUAIFENESIN-DM 100-10 MG/5ML PO SYRP: 5.0000 mL | ORAL_SOLUTION | ORAL | 0 refills | Status: AC | PRN

## 2023-12-13 MED ORDER — PREDNISONE 20 MG PO TABS: 40.0000 mg | ORAL_TABLET | Freq: Every day | ORAL | 0 refills | Status: DC

## 2023-12-13 MED ORDER — AMLODIPINE BESYLATE 5 MG PO TABS: 5.0000 mg | ORAL_TABLET | Freq: Every day | ORAL | 11 refills | Status: DC

## 2023-12-13 MED ORDER — CEFUROXIME AXETIL 500 MG PO TABS: 500.0000 mg | ORAL_TABLET | ORAL | 0 refills | Status: DC

## 2023-12-13 MED ORDER — TRAMADOL HCL 50 MG PO TABS: 50.0000 mg | ORAL_TABLET | Freq: Every evening | ORAL | 0 refills | Status: AC

## 2023-12-13 MED ORDER — CEFUROXIME AXETIL 500 MG PO TABS: 500.0000 mg | ORAL_TABLET | ORAL | Status: DC

## 2023-12-13 MED ORDER — AZITHROMYCIN 500 MG PO TABS: 500.0000 mg | ORAL_TABLET | Freq: Every day | ORAL | 0 refills | Status: DC

## 2023-12-13 NOTE — Discharge Instructions (Signed)
 Resume your HD as before

## 2023-12-13 NOTE — Progress Notes (Signed)
 Referring Provider: No ref. provider found Primary Care Physician:  Valora Agent, MD Primary Nephrologist:  Dr.   [place] for Consultation: ESRD  HPI: 58 year old female with a past medical history of end-stage renal disease on hemodialysis Monday Wednesday Friday schedule, ANCA related glomerulonephritis, diet-controlled diabetes, orthostatic hypotension and thrombocytopenia and CLL now admitted with history of chest congestion, cough and shortness of breath.    Update: Patient seen resting in bed Alert Denies pain Room air, denies shortness of breath  Past Medical History:  Diagnosis Date   Acute hemorrhoid 03/11/2015   Anxiety    Arthritis    Asthma    Cancer (HCC)    Chronic back pain    CLL (chronic lymphocytic leukemia) (HCC)    Depression    Diabetes mellitus without complication (HCC)    Genital herpes    type 2   Hypertension    Hypothyroidism    Renal disorder    Vertigo     Past Surgical History:  Procedure Laterality Date   ABDOMINAL HYSTERECTOMY     BILATERAL SALPINGOOPHORECTOMY  2009   BREAST BIOPSY Right 05/17/2016   FIBROADENOMATOUS CHANGE AND SCLEROSING ADENOSIS WITH   COLONOSCOPY WITH PROPOFOL  N/A 06/16/2015   Procedure: COLONOSCOPY WITH PROPOFOL ;  Surgeon: Donnice Vaughn Manes, MD;  Location: [facility] ENDOSCOPY;  Service: Endoscopy;  Laterality: N/A;   DIALYSIS/PERMA CATHETER INSERTION N/A 12/17/2020   Procedure: DIALYSIS/PERMA CATHETER INSERTION;  Surgeon: Marea Selinda RAMAN, MD;  Location: [facility] INVASIVE CV LAB;  Service: Cardiovascular;  Laterality: N/A;   LAPAROSCOPIC SUPRACERVICAL HYSTERECTOMY  2009   due to leio   OOPHORECTOMY     TEMPORARY DIALYSIS CATHETER N/A 12/10/2020   Procedure: TEMPORARY DIALYSIS CATHETER;  Surgeon: Jama Cordella MATSU, MD;  Location: [facility] INVASIVE CV LAB;  Service: Cardiovascular;  Laterality: N/A;   THORACIC LAMINECTOMY FOR EPIDURAL ABSCESS Bilateral 06/17/2020   Procedure: THORACIC LAMINECTOMY FOR EPIDURAL ABSCESS;  Surgeon: Bluford Standing, MD;  Location: [facility] ORS;  Service: Neurosurgery;  Laterality: Bilateral;    Prior to Admission medications   Medication Sig Start Date End Date Taking? Authorizing Provider  acetaminophen  (TYLENOL ) 325 MG tablet Take 2 tablets (650 mg total) by mouth every 6 (six) hours as needed for mild pain (or Fever >/= 101). 07/30/20  Yes Angiulli, Toribio PARAS, PA-C  albuterol  (VENTOLIN  HFA) 108 (90 Base) MCG/ACT inhaler Inhale 2 puffs into the lungs every 6 (six) hours as needed for wheezing or shortness of breath. 10/18/21  Yes [provider]  amitriptyline  (ELAVIL ) 25 MG tablet Take 1 tablet (25 mg total) by mouth at bedtime. 12/06/23  Yes Marcelino Nurse, MD  atorvastatin  (LIPITOR) 20 MG tablet Take 20 mg by mouth daily. 10/18/21  Yes [provider]  cetirizine (ZYRTEC) 10 MG tablet Take 1 tablet by mouth daily. 07/07/23  Yes [provider]  fluticasone  (FLONASE ) 50 MCG/ACT nasal spray Place 2 sprays into both nostrils daily.   Yes [provider]  folic acid  (FOLVITE ) 1 MG tablet Take 1 tablet by mouth daily. 10/18/21  Yes [provider]  hydrOXYzine  (ATARAX ) 25 MG tablet Take 1 tablet (25 mg total) by mouth 3 (three) times daily as needed for itching. 08/19/23  Yes Von Bellis, MD  levothyroxine  (SYNTHROID ) 125 MCG tablet Take 125 mcg by mouth daily. Take 1 tablet daily except Sunday take 1 and 1/2 tablet (187.5 mg). 12/08/21  Yes [provider]  lipase/protease/amylase (CREON ) 12000-38000 units CPEP capsule Take 1 capsule by mouth. 09/06/23  Yes [provider]  midodrine  (PROAMATINE ) 2.5 MG tablet Take 2.5 mg by mouth as directed. Only on dialysis days. (Monday Wednesday and Friday) 11/24/23  Yes [provider]  multivitamin (RENA-VIT) TABS tablet Take 1 tablet by mouth at bedtime. 12/22/21  Yes Sreenath, Sudheer B, MD  ondansetron  (ZOFRAN -ODT) 8 MG disintegrating tablet Take 1 tablet (8 mg total) by mouth every 8 (eight) hours as  needed for nausea or vomiting. 11/04/23  Yes Jacobo Evalene PARAS, MD  pantoprazole  (PROTONIX ) 40 MG tablet Take 1 tablet (40 mg total) by mouth 2 (two) times daily for 28 days, THEN 1 tablet (40 mg total) daily. 08/19/23 06/11/26 Yes Von Bellis, MD  promethazine  (PHENERGAN ) 12.5 MG tablet Take 1 tablet (12.5 mg total) by mouth every 6 (six) hours as needed. 11/06/23  Yes Willo Dunnings, MD  traMADol  (ULTRAM ) 50 MG tablet Take 1 tablet (50 mg total) by mouth every 8 (eight) hours as needed. Patient taking differently: Take 50 mg by mouth at bedtime. 09/01/23  Yes Jacobo Evalene PARAS, MD  zinc  sulfate 220 (50 Zn) MG capsule Take 1 capsule (220 mg total) by mouth daily. 12/23/21  Yes Sreenath, Sudheer B, MD  Blood Glucose Monitoring Suppl DEVI 1 each by Does not apply route 3 (three) times daily. May dispense any manufacturer covered by patient's insurance. 08/19/23   Von Bellis, MD  calcium  acetate (PHOSLO ) 667 MG capsule Take 1,334 mg by mouth 3 (three) times daily with meals. Patient not taking: Reported on 12/10/2023 05/11/23 05/10/24  [provider]  Glucose Blood (BLOOD GLUCOSE TEST STRIPS) STRP 1 each by Does not apply route 3 (three) times daily. Use as directed to check blood sugar. May dispense any manufacturer covered by patient's insurance and fits patient's device. 08/19/23   Von Bellis, MD  Lancet Device MISC 1 each by Does not apply route 3 (three) times daily. May dispense any manufacturer covered by patient's insurance. 08/19/23   Von Bellis, MD  Lancets MISC 1 each by Does not apply route 3 (three) times daily. Use as directed to check blood sugar. May dispense any manufacturer covered by patient's insurance and fits patient's device. 08/19/23   Von Bellis, MD  valACYclovir  (VALTREX ) 500 MG tablet Take 500 mg by mouth as directed. Take 1 tablet daily on dialysis days.(Monday, Wednesday and Friday)    [provider]  budesonide-formoterol (SYMBICORT) 160-4.5 MCG/ACT  inhaler Inhale into the lungs. 07/14/18 03/15/19  [provider]    Current Facility-Administered Medications  Medication Dose Route Frequency Provider Last Rate Last Admin   acetaminophen  (TYLENOL ) tablet 650 mg  650 mg Oral Q6H PRN Duncan, Hazel V, MD       Or   acetaminophen  (TYLENOL ) suppository 650 mg  650 mg Rectal Q6H PRN Duncan, Hazel V, MD       albuterol  (PROVENTIL ) (2.5 MG/3ML) 0.083% nebulizer solution 2.5 mg  2.5 mg Nebulization Q2H PRN Duncan, Hazel V, MD       amitriptyline  (ELAVIL ) tablet 25 mg  25 mg Oral QHS Duncan, Hazel V, MD   25 mg at 12/12/23 2117   atorvastatin  (LIPITOR) tablet 20 mg  20 mg Oral Daily Duncan, Hazel V, MD   20 mg at 12/12/23 2118   azithromycin  (ZITHROMAX ) tablet 500 mg  500 mg Oral Daily Kandis Devaughn Sayres, MD   500 mg at 12/12/23 2117   [START ON 12/14/2023] cefUROXime  (CEFTIN ) tablet 500 mg  500 mg Oral Q M,W,F-HD Patel, Sona, MD       Chlorhexidine  Gluconate  Cloth 2 % PADS 6 each  6 each Topical Q0600 Dominica Brandy, MD   6 each at 12/13/23 0629   guaiFENesin  (MUCINEX ) 12 hr tablet 600 mg  600 mg Oral BID Cleatus Hoof V, MD   600 mg at 12/13/23 0900   guaiFENesin -dextromethorphan  (ROBITUSSIN DM) 100-10 MG/5ML syrup 5 mL  5 mL Oral Q4H PRN Wouk, Devaughn Sayres, MD       heparin  injection 5,000 Units  5,000 Units Subcutaneous Q8H Cleatus Hoof GAILS, MD   5,000 Units at 12/13/23 9370   hydrochlorothiazide  (HYDRODIURIL ) tablet 12.5 mg  12.5 mg Oral Daily Kandis Devaughn Sayres, MD   12.5 mg at 12/11/23 1253   HYDROmorphone  (DILAUDID ) injection 1 mg  1 mg Intravenous Q4H PRN Kandis Devaughn Sayres, MD   1 mg at 12/12/23 1220   hydrOXYzine  (ATARAX ) tablet 25 mg  25 mg Oral TID PRN Duncan, Hazel V, MD   25 mg at 12/12/23 1334   insulin  aspart (novoLOG ) injection 0-5 Units  0-5 Units Subcutaneous QHS Cleatus Hoof GAILS, MD   3 Units at 12/12/23 2123   insulin  aspart (novoLOG ) injection 0-6 Units  0-6 Units Subcutaneous TID WC Duncan, Hazel V, MD   1 Units at  12/12/23 1654   levothyroxine  (SYNTHROID ) tablet 125 mcg  125 mcg Oral Once per day on Monday Tuesday Wednesday Thursday Friday Saturday Sharilyn Estill HERO, [place]   125 mcg at 12/13/23 9370   And   levothyroxine  (SYNTHROID ) tablet 187.5 mcg  187.5 mcg Oral Every Sunday Hallaji, Sheema M, RPH   187.5 mcg at 12/11/23 9052   lipase/protease/amylase (CREON ) capsule 12,000 Units  12,000 Units Oral TID AC Jonel Lonni SQUIBB, MD   12,000 Units at 12/13/23 0900   nystatin  (MYCOSTATIN /NYSTOP ) topical powder   Topical TID Jonel Lonni SQUIBB, MD   Given at 12/12/23 2123   ondansetron  (ZOFRAN ) tablet 4 mg  4 mg Oral Q6H PRN Cleatus Hoof GAILS, MD       Or   ondansetron  (ZOFRAN ) injection 4 mg  4 mg Intravenous Q6H PRN Duncan, Hazel V, MD   4 mg at 12/11/23 0050   oxyCODONE  (Oxy IR/ROXICODONE ) immediate release tablet 5 mg  5 mg Oral Q6H PRN Kandis Devaughn Sayres, MD   5 mg at 12/11/23 1718   pantoprazole  (PROTONIX ) EC tablet 40 mg  40 mg Oral Daily Cleatus Hoof V, MD   40 mg at 12/13/23 0900   predniSONE  (DELTASONE ) tablet 40 mg  40 mg Oral Q breakfast Kandis Devaughn Sayres, MD   40 mg at 12/13/23 0859   sodium chloride  (OCEAN) 0.65 % nasal spray 1 spray  1 spray Each Nare PRN Danford, Christopher P, MD   1 spray at 12/12/23 2325   traMADol  (ULTRAM ) tablet 50 mg  50 mg Oral Q12H PRN Duncan, Hazel V, MD   50 mg at 12/11/23 0753    Allergies as of 12/10/2023 - Review Complete 12/10/2023  Allergen Reaction Noted   Amoxapine  11/04/2020   Latex Other (See Comments) 11/22/2022   Ceftazidime  04/27/2021   Iodinated contrast media Hives and Rash 11/22/2022   Penicillins  11/04/2020    Family History  Problem Relation Age of Onset   Cancer Paternal Aunt    Breast cancer Maternal Aunt 51    Social History   Socioeconomic History   Marital status: Married    Spouse name: Not on file   Number of children: Not on file   Years of education: Not on file  Highest education level: Not on file  Occupational  History   Not on file  Tobacco Use   Smoking status: Never   Smokeless tobacco: Never  Vaping Use   Vaping status: Never Used  Substance and Sexual Activity   Alcohol use: Not Currently    Alcohol/week: 1.0 standard drink of alcohol    Types: 1 Cans of beer per week   Drug use: No   Sexual activity: Not Currently    Birth control/protection: None  Other Topics Concern   Not on file  Social History Narrative   Not on file   Social Drivers of Health   Financial Resource Strain: High Risk (07/06/2023)   Received from [facility]   Overall Financial Resource Strain (CARDIA)    Difficulty of Paying Living Expenses: Hard  Food Insecurity: No Food Insecurity (12/11/2023)   Hunger Vital Sign    Worried About Running Out of Food in the Last Year: Never true    Ran Out of Food in the Last Year: Never true  Transportation Needs: Unmet Transportation Needs (12/11/2023)   PRAPARE - Administrator, Civil Service (Medical): Yes    Lack of Transportation (Non-Medical): Yes  Physical Activity: Not on file  Stress: Not on file  Social Connections: Unknown (08/12/2023)   Social Connection and Isolation Panel    Frequency of Communication with Friends and Family: More than three times a week    Frequency of Social Gatherings with Friends and Family: More than three times a week    Attends Religious Services: More than 4 times per year    Active Member of Clubs or Organizations: Patient declined    Attends Banker Meetings: Patient declined    Marital Status: Not on file  Intimate Partner Violence: Not At Risk (12/11/2023)   Humiliation, Afraid, Rape, and Kick questionnaire    Fear of Current or Ex-Partner: No    Emotionally Abused: No    Physically Abused: No    Sexually Abused: No    Physical Exam: Vital signs in last 24 hours: Temp:  [97.6 F (36.4 C)-98.5 F (36.9 C)] 98.2 F (36.8 C) (08/19 0725) Pulse Rate:  [57-79] 59 (08/19 0725) Resp:   [14-24] 15 (08/19 0725) BP: (137-172)/(43-113) 137/70 (08/19 0725) SpO2:  [95 %-100 %] 98 % (08/19 0725) Weight:  [90.5 kg] 90.5 kg (08/18 1540) Last BM Date : 12/09/23  General:   Alert,  Well-developed, well-nourished, pleasant and cooperative in NAD Head:  Normocephalic and atraumatic. Eyes:  Sclera clear, no icterus.   Conjunctiva pink. Ears:  Normal auditory acuity. Nose:  No deformity, discharge,  or lesions. Lungs: Diminished in bases. Heart:  Regular rate and rhythm; no murmurs, clicks, rubs,  or gallops. Abdomen:  Soft, nontender and nondistended. No masses, hepatosplenomegaly or hernias noted. Normal bowel sounds, without guarding, and without rebound.   Extremities:  Without clubbing or edema.  Access Rt AVF  Intake/Output from previous day: 08/18 0701 - 08/19 0700 In: 200 [IV Piggyback:200] Out: 1000  Intake/Output this shift: Total I/O In: 240 [P.O.:240] Out: -   Lab Results: Recent Labs    12/11/23 0424 12/12/23 0554 12/13/23 0338  WBC 3.8* 4.6 4.0  HGB 8.8* 9.0* 8.5*  HCT 28.1* 24.6* 23.6*  PLT 70* 82* 108*   BMET Recent Labs    12/10/23 1404 12/12/23 0554 12/13/23 0338  NA 137 138 135  K 3.9 4.8 4.8  CL 96* 98 96*  CO2 25 23  27  GLUCOSE 124* 177* 260*  BUN 45* 69* 59*  CREATININE 6.99* 9.93* 6.91*  CALCIUM  8.5* 8.0* 8.2*   LFT Recent Labs    12/10/23 1404  PROT 7.3  ALBUMIN  3.1*  AST 41  ALT 8  ALKPHOS 60  BILITOT 0.9   PT/INR No results for input(s): LABPROT, INR in the last 72 hours. Hepatitis Panel Recent Labs    12/12/23 0554  HEPBSAG NON REACTIVE    Studies/Results: NM Pulmonary Perfusion Result Date: 12/12/2023 CLINICAL DATA:  Dyspnea, elevated D-dimer EXAM: NUCLEAR MEDICINE PERFUSION LUNG SCAN TECHNIQUE: Perfusion images were obtained in multiple projections after intravenous injection of radiopharmaceutical. Ventilation scans intentionally deferred if perfusion scan and chest x-ray adequate for interpretation.  RADIOPHARMACEUTICALS:  4.4 mCi Tc-74m MAA IV COMPARISON:  12/12/2023 chest x-ray FINDINGS: Planar images of the lungs are obtained in multiple projections during the perfusion exam. There are no wedge-shaped perfusion defects. Normal symmetrical radiotracer distribution within the lungs. IMPRESSION: 1. No evidence of pulmonary embolus.  Normal perfusion exam. Electronically Signed   By: Ozell Daring M.D.   On: 12/12/2023 18:44   DG Chest 2 View Result Date: 12/12/2023 CLINICAL DATA:  Pulmonary embolism. EXAM: CHEST - 2 VIEW COMPARISON:  12/10/2023 and CT chest 12/12/2021. FINDINGS: Trachea is midline. Heart is enlarged, stable. There may be minimal bibasilar interstitial prominence, left greater than right. Small bilateral effusions. IMPRESSION: Suspect mild congestive heart failure. Electronically Signed   By: Newell Eke M.D.   On: 12/12/2023 11:00    Assessment/Plan:  58 year old female with a past medical history of end-stage renal disease on hemodialysis Monday Wednesday Friday schedule, ANCA related glomerulonephritis, diet-controlled diabetes, orthostatic hypotension and thrombocytopenia and CLL now admitted with history of chest congestion, cough and shortness of breath.  She is now being treated with antibiotics for pneumonia.  [facility]/MWF/Rt AVF  ESRD on hemodialysis: Dialysis received yesterday, UF 1L achieved. Next treatment scheduled for Wednesday  ANEMIA with chronic kidney disease: Hgb borderline, 8.5. Patient receives Mircera at outpatient clinic.   Secondary Hyperparathyroidism: with outpatient labs: PTH 568, phosphorus 5.1, calcium  9.0 on 11/28/23  Lab Results  Component Value Date   CALCIUM  8.2 (L) 12/13/2023   PHOS 5.2 (H) 08/19/2023   Currently prescribed calcitriol and calcium  acetate outpatient. Calcium  acceptable.   Hypertension with chronic kidney disease. Home regimen includes midodrine , currently held. Receiving hydrochlorothiazide  during this  admission. Blood pressure stable  Diabetes mellitus type II with chronic kidney disease/renal manifestations: noninsulin dependent. Most recent hemoglobin A1c is 5.6 on 07/29/23.       CAP, chest xray shows left lower lobe bronchopneumonia. Primary team has ordered Rocephin  and Azithromycin . Elevated D-dimer, VQ scan negative for PE    LOS: 2 Faith Harris, MD [facility] @TODAY @12 :19 PM

## 2023-12-13 NOTE — Progress Notes (Signed)
 Occupational Therapy Treatment Patient Details Name: Tonya Myers MRN: [id number] DOB: 1965/09/13 Today's Date: 12/13/2023   History of present illness Pt is a 58 y.o. female being admitted for community-acquired pneumonia and bilateral wrist pain s/p fall at home. 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 Upon arrival to room pt seated EOB, agreeable to tx.  Discussed d/c plan as pt was initially expecting to d/c to SNF today however pt is now refusing SNF placement requesting now to return home.  Education on benefits of at least having home health services to safely move along the continuum of care in the home setting with discussion on utilizing family support at home.  Pt reported that she has a Network engineer and an adult grandson (23 y/o) who would be able to provide some supervision level support at home and that her family could assist her in modifying her home to support a 1st floor set-up.  Pt encouraged to seek support from family or community resources for IADL support (laundry services, family support for cooking/meal prep/Meals on Wheels etcetera). Pt verbalized agreement and actively participated throughout this discussion.  CM present for part of session to discuss updated discharge plan based on pt desire to not transition to SNF level care.  Pt confirmed that she already has a RW, 3:1 over toilet, shower seat with back and rails, and grab bars in tub/shower combo.  Pt making good progress toward goals, will continue  to follow POC. Discharge recommendation remains appropriate.        If plan is discharge home, recommend the following:  A little help with walking and/or transfers;A little help with bathing/dressing/bathroom;Assistance with cooking/housework   Equipment Recommendations       Recommendations for Other Services      Precautions / Restrictions Precautions Precautions: Fall Recall of Precautions/Restrictions: Intact Restrictions Weight Bearing Restrictions Per Provider Order: No       Mobility Bed Mobility                    Transfers Overall transfer level: Needs assistance Equipment used: Rolling walker (2 wheels) Transfers: Sit to/from Stand Sit to Stand: Supervision           General transfer comment: increased time however able to complete with SBA to supervision     Balance Overall balance assessment: Needs assistance Sitting-balance support: Feet supported Sitting balance-Leahy Scale: Good     Standing balance support: Single extremity supported, Bilateral upper extremity supported, During functional activity Standing balance-Leahy Scale: Fair                             ADL either performed or assessed with clinical judgement   ADL                           Toilet Transfer: Rolling walker (2 wheels);BSC/3in1 Toilet Transfer Details (indicate cue type and reason): simulation                Extremity/Trunk Assessment Upper Extremity Assessment Upper Extremity Assessment: Generalized weakness  Vision       Perception     Praxis     Communication Communication Communication: No apparent difficulties   Cognition Arousal: Alert Behavior During Therapy: WFL for tasks assessed/performed Cognition: No apparent impairments                               Following commands: Intact        Cueing      Exercises Other Exercises Other Exercises: Pt is requesting to not d/c to SNF  setting and instead requested to return home.  Education on benefits of home with [facility] and family support (from 67 y/o grandson if possible).    Shoulder Instructions       General Comments      Pertinent Vitals/ Pain       Pain Assessment Pain Assessment: Faces Faces Pain Scale: Hurts a little bit Pain Location: bil wrists Pain Descriptors / Indicators: Aching, Sore  Home Living                                          Prior Functioning/Environment              Frequency  Min 2X/week        Progress Toward Goals  OT Goals(current goals can now be found in the care plan section)  Progress towards OT goals: Progressing toward goals  Acute Rehab OT Goals Potential to Achieve Goals: Good  Plan      Co-evaluation                 AM-PAC OT 6 Clicks Daily Activity     Outcome Measure   Help from another person eating meals?: None Help from another person taking care of personal grooming?: None Help from another person toileting, which includes using toliet, bedpan, or urinal?: A Little Help from another person bathing (including washing, rinsing, drying)?: A Little Help from another person to put on and taking off regular upper body clothing?: None Help from another person to put on and taking off regular lower body clothing?: A Little 6 Click Score: 21    End of Session Equipment Utilized During Treatment: Rolling walker (2 wheels);Gait belt  OT Visit Diagnosis: Other abnormalities of gait and mobility (R26.89);Muscle weakness (generalized) (M62.81);Repeated falls (R29.6)   Activity Tolerance Patient tolerated treatment well   Patient Left in bed;with call bell/phone within reach;with bed alarm set   Nurse Communication Mobility status        Time: 8663-8642 OT Time Calculation (min): 21 min  Charges: OT General Charges $OT Visit: 1 Visit  Tonya Myers OTR/L   Tonya Myers 12/13/2023, 2:16 PM

## 2023-12-13 NOTE — TOC Progression Note (Signed)
 Transition of Care [facility]) - Progression Note    Patient Details  Name: Tonya Myers MRN: [id number] Date of Birth: 05-Jul-1965  Transition of Care [facility]) [name] Contact  Alfonso Rummer, [name] Phone Number: 12/13/2023, 2:53 PM  Clinical Narrative:   KEN DELENA Rummer submitted bed offers on behalf of Ms. Shvartsman. Pt requested [facility]. Bed offer was accepted with [facility] then patient declined offer due to google reviews. Pt verbalized she rather go home. [name] A Trease Bremner communicated with Dr. Tobie of Ms. Kees refusal for snf placement. Pt will go home with home health.     Expected Discharge Plan: Skilled Nursing Facility Barriers to Discharge:  (Pt reports she lives alone and reports difficulty with ADL's)               Expected Discharge Plan and Services       Living arrangements for the past 2 months: Single Family Home Expected Discharge Date: 12/13/23                                     Social Drivers of Health (SDOH) Interventions SDOH Screenings   Food Insecurity: No Food Insecurity (12/11/2023)  Housing: Low Risk  (12/11/2023)  Transportation Needs: Unmet Transportation Needs (12/11/2023)  Utilities: Not At Risk (12/11/2023)  Depression (PHQ2-9): Low Risk  (12/06/2023)  Financial Resource Strain: High Risk (07/06/2023)   Received from [facility]  Social Connections: Unknown (08/12/2023)  Tobacco Use: Low Risk  (12/10/2023)    Readmission Risk Interventions    12/24/2021   12:03 PM  Readmission Risk Prevention Plan  Transportation Screening Complete  Medication Review (RN Care Manager) Complete  PCP or Specialist appointment within 3-5 days of discharge Complete  HRI or Home Care Consult Complete  SW Recovery Care/Counseling Consult Complete  Palliative Care Screening Not Applicable  Skilled Nursing Facility Complete

## 2023-12-13 NOTE — Progress Notes (Signed)
 [facility]  - [place] [facility]   PATIENT NAME: Tonya Myers    MR#:  [id number]  DATE OF BIRTH:  10-26-1965  SUBJECTIVE:  No family art bedside. Pt overall doing well. No resp distress. On RA no fever Sitting at edge of bed    VITALS:  Blood pressure 137/70, pulse (!) 59, temperature 98.2 F (36.8 C), resp. rate 15, height 6' (1.829 m), weight 90.5 kg, SpO2 98%.  PHYSICAL EXAMINATION:   GENERAL:  58 y.o.-year-old patient with no acute distress.  LUNGS: Normal breath sounds bilaterally, no wheezing CARDIOVASCULAR: S1, S2 normal. No murmur   ABDOMEN: Soft, nontender, nondistended. EXTREMITIES: No  edema b/l.    NEUROLOGIC: nonfocal  patient is alert and awake  LABORATORY PANEL:  CBC Recent Labs  Lab 12/13/23 0338  WBC 4.0  HGB 8.5*  HCT 23.6*  PLT 108*    Chemistries  Recent Labs  Lab 12/10/23 1404 12/12/23 0554 12/13/23 0338  NA 137   < > 135  K 3.9   < > 4.8  CL 96*   < > 96*  CO2 25   < > 27  GLUCOSE 124*   < > 260*  BUN 45*   < > 59*  CREATININE 6.99*   < > 6.91*  CALCIUM  8.5*   < > 8.2*  AST 41  --   --   ALT 8  --   --   ALKPHOS 60  --   --   BILITOT 0.9  --   --    < > = values in this interval not displayed.   Cardiac Enzymes No results for input(s): TROPONINI in the last 168 hours. RADIOLOGY:  NM Pulmonary Perfusion Result Date: 12/12/2023 CLINICAL DATA:  Dyspnea, elevated D-dimer EXAM: NUCLEAR MEDICINE PERFUSION LUNG SCAN TECHNIQUE: Perfusion images were obtained in multiple projections after intravenous injection of radiopharmaceutical. Ventilation scans intentionally deferred if perfusion scan and chest x-ray adequate for interpretation. RADIOPHARMACEUTICALS:  4.4 mCi Tc-63m MAA IV COMPARISON:  12/12/2023 chest x-ray FINDINGS: Planar images of the lungs are obtained in multiple projections during the perfusion exam. There are no wedge-shaped perfusion defects. Normal symmetrical radiotracer distribution within the  lungs. IMPRESSION: 1. No evidence of pulmonary embolus.  Normal perfusion exam. Electronically Signed   By: Ozell Daring M.D.   On: 12/12/2023 18:44   DG Chest 2 View Result Date: 12/12/2023 CLINICAL DATA:  Pulmonary embolism. EXAM: CHEST - 2 VIEW COMPARISON:  12/10/2023 and CT chest 12/12/2021. FINDINGS: Trachea is midline. Heart is enlarged, stable. There may be minimal bibasilar interstitial prominence, left greater than right. Small bilateral effusions. IMPRESSION: Suspect mild congestive heart failure. Electronically Signed   By: Newell Eke M.D.   On: 12/12/2023 11:00    Assessment and Plan  58 y.o. F with ESRD on HD MWF, CLL in remission, thrombocytopenia, hx S pneumo meningitis and epidural abscess 2022, also chronic orthostasis presented with cough, found to have pneumonia.   Ortho consulted for wrist pain after a fall.  VQ scan ordered due to elevated d-dimer.     Assessment and Plan: *Community-acquired pneumonia left lower lobe Still febrile in the last 24 hours.  Strep pneumo urinary antigen negative.  RVP and COVID-negative. -VQ scan--neg for PE - Continue Rocephin  and azithromycin --change to oral meds --remains afebrile and on RA   Bilateral wrist pain Fall onto outstretched arms 12/08/2023 Difficulty ambulating with walker due to wrist pain --Evaluated by orthopedics.  No gross deformity, x-rays negative.  There was some concern for gout, and urate normal but low NPV, so steroids started. CRP elevated, nonspecific. 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  Telmisartan  stopped in started on midodrine  PRN last  April.  Hasn't needed midodrine  in over a month. 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 TOC for rehab    Procedures: Family communication :none Consults :ortho CODE STATUS: full DVT Prophylaxis :heparin  Level of care: Med-Surg   TOTAL TIME TAKING CARE OF THIS PATIENT: 35 minutes.  >50% time spent on counselling and coordination of care  Note: This dictation was prepared with Dragon dictation along with smaller phrase technology. Any transcriptional errors that result from this process are unintentional.  Leita Blanch M.D    [facility]   CC: Primary care physician; Valora Agent, MD

## 2023-12-13 NOTE — Progress Notes (Signed)
 Pt receives outpt HD at [facility] on  MWF at  6:00am. Navigator following to assist with any HD needs.  Suzen Satchel Dialysis Navigator [phone]

## 2023-12-13 NOTE — Discharge Summary (Addendum)
 Physician Discharge Summary   Patient: Tonya Myers MRN: [id number] DOB: 1965-08-17  Admit date:     12/10/2023  Discharge date: 12/13/23  Discharge Physician: Leita Blanch   PCP: Valora Agent, MD   Recommendations at discharge:   follow-up PCP in 1 to 2 week follow-up Dr. 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 F with ESRD on HD MWF, CLL in remission, thrombocytopenia, hx S pneumo meningitis and epidural abscess 2022, also chronic orthostasis presented with cough, found to have pneumonia.   Ortho consulted for wrist pain after a fall.  VQ scan ordered due to elevated d-dimer.     Assessment and Plan: *Community-acquired pneumonia left lower lobe Still febrile in the last 24 hours.  Strep pneumo urinary antigen negative.  RVP and COVID-negative. -VQ scan--neg for PE - Continue Rocephin  and azithromycin --change to oral meds --remains afebrile and on RA   Bilateral wrist pain Fall onto outstretched arms 12/08/2023 Difficulty ambulating with walker due to wrist pain --Evaluated by orthopedics.  No gross deformity, x-rays negative.  There was some concern for gout, and urate normal but low NPV, so steroids started. CRP elevated, nonspecific. 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  Telmisartan  stopped in started on midodrine  PRN last April.  Hasn't needed midodrine  in over a month. 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 TOC for rehab Addendum-- received a phone call from social worker patient has changed her mind and does not want to go to rehab. She is requesting to go home. I spoke personally with patient on the phone and she is requesting to go home. Home health has been arranged. Vannie has been ordered. Patient is recommended to resume her dialysis as before.       Procedures: Family communication :none Consults :ortho CODE STATUS: full DVT Prophylaxis :heparin      Pain control - [place]  Controlled Substance Reporting System database was reviewed. and patient was instructed, not to drive, operate heavy machinery, perform activities at heights, swimming or participation in water activities or provide  baby-sitting services while on Pain, Sleep and Anxiety Medications; until their outpatient Physician has advised to do so again. Also recommended to not to take more than prescribed  Pain, Sleep and Anxiety Medications.    Disposition: Rehabilitation facility Diet recommendation:  Discharge Diet Orders (From admission, onward)     Start     Ordered   12/13/23 0000  Diet - low sodium heart healthy        12/13/23 1039           Cardiac and Carb modified diet DISCHARGE MEDICATION: Allergies as of 12/13/2023       Reactions   Amoxapine    Other Reaction(s): Unknown   Latex Other (See Comments)   Ceftazidime    Other reaction(s): Confusion, Hallucination, Unknown Encephalopathy - improved after dialysis/some concern for cephalosporin neurotoxicity Tolerated without confusion 06/2021. Encephalopathy - improved after dialysis/some concern for cephalosporin neurotoxicity Tolerated without confusion 06/2021.   Iodinated Contrast Media Hives, Rash   Other Reaction(s): Hives   Penicillins    Other Reaction(s): Unknown        Medication List     STOP taking these medications    promethazine  12.5 MG tablet Commonly known as: PHENERGAN        TAKE these medications    acetaminophen  325 MG tablet Commonly known as: TYLENOL  Take 2 tablets (650 mg total) by mouth every 6 (six) hours as needed for mild pain (or Fever >/= 101).   albuterol  108 (90 Base) MCG/ACT inhaler Commonly known as: VENTOLIN  HFA Inhale 2 puffs into the lungs every 6 (six) hours as needed for wheezing or shortness of breath.   amitriptyline  25 MG tablet Commonly known as: ELAVIL  Take 1 tablet (25 mg total) by mouth at bedtime.   amLODipine  5 MG tablet Commonly known as: NORVASC  Take 1 tablet (5 mg total) by mouth daily.   atorvastatin  20 MG tablet Commonly known as: LIPITOR Take 20 mg by mouth daily.   azithromycin  500 MG tablet Commonly known as: ZITHROMAX  Take 1 tablet (500 mg total) by mouth daily for 2 days.   Blood Glucose Monitoring Suppl Devi 1 each by Does not apply route 3 (three) times daily. May dispense any manufacturer covered by patient's insurance.    BLOOD GLUCOSE TEST STRIPS Strp 1 each by Does not apply route 3 (three) times daily. Use as directed to check blood sugar. May dispense any manufacturer covered by patient's insurance and fits patient's device.   calcium  acetate 667 MG capsule Commonly known as: PHOSLO  Take 1,334 mg by mouth 3 (three) times daily with meals.   cefUROXime  500 MG tablet Commonly known as: CEFTIN  Take 1 tablet (500 mg total) by mouth every Monday, Wednesday, and Friday with hemodialysis for 2 doses. Start taking on: December 14, 2023   cetirizine 10 MG tablet Commonly known as: ZYRTEC Take 1 tablet by mouth daily.   Creon  12000-38000 units Cpep capsule Generic drug: lipase/protease/amylase Take 1 capsule by mouth.   fluticasone  50 MCG/ACT nasal spray Commonly known as: FLONASE  Place 2 sprays into both nostrils daily.   folic acid  1 MG tablet Commonly known as: FOLVITE  Take 1 tablet by mouth daily.   guaiFENesin -dextromethorphan  100-10 MG/5ML syrup Commonly known as: ROBITUSSIN DM Take 5 mLs by mouth every 4 (four) hours as needed for cough.   hydrOXYzine  25 MG tablet Commonly known as: ATARAX  Take 1 tablet (25 mg total) by mouth 3 (three) times daily as needed for itching.   Lancet Device Misc 1 each  by Does not apply route 3 (three) times daily. May dispense any manufacturer covered by patient's insurance.   Lancets Misc 1 each by Does not apply route 3 (three) times daily. Use as directed to check blood sugar. May dispense any manufacturer covered by patient's insurance and fits patient's device.   levothyroxine  125 MCG tablet Commonly known as: SYNTHROID  Take 125 mcg by mouth daily. Take 1 tablet daily except Sunday take 1 and 1/2 tablet (187.5 mg).   midodrine  2.5 MG tablet Commonly known as: PROAMATINE  Take 2.5 mg by mouth as directed. Only on dialysis days. (Monday Wednesday and Friday)   multivitamin Tabs tablet Take 1 tablet by mouth at bedtime.   ondansetron  8 MG  disintegrating tablet Commonly known as: ZOFRAN -ODT Take 1 tablet (8 mg total) by mouth every 8 (eight) hours as needed for nausea or vomiting.   pantoprazole  40 MG tablet Commonly known as: Protonix  Take 1 tablet (40 mg total) by mouth 2 (two) times daily for 28 days, THEN 1 tablet (40 mg total) daily. Start taking on: August 19, 2023   predniSONE  20 MG tablet Commonly known as: DELTASONE  Take 2 tablets (40 mg total) by mouth daily with breakfast for 2 days. Start taking on: December 14, 2023   traMADol  50 MG tablet Commonly known as: ULTRAM  Take 1 tablet (50 mg total) by mouth at bedtime.   valACYclovir  500 MG tablet Commonly known as: VALTREX  Take 500 mg by mouth as directed. Take 1 tablet daily on dialysis days.(Monday, Wednesday and Friday)   zinc  sulfate (50mg  elemental zinc ) 220 (50 Zn) MG capsule Take 1 capsule (220 mg total) by mouth daily.        Contact information for follow-up providers     Valora Agent, MD Follow up.   Specialty: Family Medicine Why: hospital follow up Contact information: [address] [facility] [place] [address] [phone]              Contact information for after-discharge care     Destination     [facility] .   Service: Skilled Nursing Contact information: [address] [place]  [address] [phone]                      Condition at discharge: fair  The results of significant diagnostics from this hospitalization (including imaging, microbiology, ancillary and laboratory) are listed below for reference.   Imaging Studies: NM Pulmonary Perfusion Result Date: 12/12/2023 CLINICAL DATA:  Dyspnea, elevated D-dimer EXAM: NUCLEAR MEDICINE PERFUSION LUNG SCAN TECHNIQUE: Perfusion images were obtained in multiple projections after intravenous injection of radiopharmaceutical. Ventilation scans intentionally deferred if perfusion scan and chest x-ray adequate for  interpretation. RADIOPHARMACEUTICALS:  4.4 mCi Tc-18m MAA IV COMPARISON:  12/12/2023 chest x-ray FINDINGS: Planar images of the lungs are obtained in multiple projections during the perfusion exam. There are no wedge-shaped perfusion defects. Normal symmetrical radiotracer distribution within the lungs. IMPRESSION: 1. No evidence of pulmonary embolus.  Normal perfusion exam. Electronically Signed   By: Ozell Daring M.D.   On: 12/12/2023 18:44   DG Chest 2 View Result Date: 12/12/2023 CLINICAL DATA:  Pulmonary embolism. EXAM: CHEST - 2 VIEW COMPARISON:  12/10/2023 and CT chest 12/12/2021. FINDINGS: Trachea is midline. Heart is enlarged, stable. There may be minimal bibasilar interstitial prominence, left greater than right. Small bilateral effusions. IMPRESSION: Suspect mild congestive heart failure. Electronically Signed   By: Newell Eke M.D.   On: 12/12/2023 11:00  DG Wrist 2 Views Left Result Date: 12/11/2023 CLINICAL DATA:  Bilateral wrist pain EXAM: LEFT WRIST - 2 VIEW; RIGHT WRIST - 2 VIEW COMPARISON:  12/10/2023 FINDINGS: Left wrist: Frontal and lateral views are obtained. No acute fracture, subluxation, or dislocation. Mild joint space narrowing and osteophyte formation at the first carpometacarpal joint. Remaining joint spaces are well preserved. Moderate subcutaneous edema throughout the distal left forearm, wrist, and base of the hand. No subcutaneous gas. Right wrist: Frontal and lateral views are obtained. No fracture, subluxation, or dislocation. Joint spaces are well preserved. Soft tissues are unremarkable. IMPRESSION: Left wrist: 1. No acute fracture. 2. Diffuse soft tissue swelling from the left forearm through the base of the hand, which could reflect cellulitis. 3. 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 Right wrist: 1. No acute fracture.  Unremarkable exam. Electronically Signed   By: Ozell Daring M.D.   On: 12/11/2023 11:29   DG Wrist 2 Views Right Result Date:  12/11/2023 CLINICAL DATA:  Bilateral wrist pain EXAM: LEFT WRIST - 2 VIEW; RIGHT WRIST - 2 VIEW COMPARISON:  12/10/2023 FINDINGS: Left wrist: Frontal and lateral views are obtained. No acute fracture, subluxation, or dislocation. Mild joint space narrowing and osteophyte formation at the first carpometacarpal joint. Remaining joint spaces are well preserved. Moderate subcutaneous edema throughout the distal left forearm, wrist, and base of the hand. No subcutaneous gas. Right wrist: Frontal and lateral views are obtained. No fracture, subluxation, or dislocation. Joint spaces are well preserved. Soft tissues are unremarkable. IMPRESSION: Left wrist: 1. No acute fracture. 2. Diffuse soft tissue swelling from the left forearm through the base of the hand, which could reflect cellulitis. 3. 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 Right wrist: 1. No acute fracture.  Unremarkable exam. Electronically Signed   By: Ozell Daring M.D.   On: 12/11/2023 11:29   DG Chest 1 View Result Date: 12/10/2023 CLINICAL DATA:  Cough EXAM: CHEST  1 VIEW COMPARISON:  07/29/2023 FINDINGS: 2 frontal views of the chest demonstrate an unremarkable cardiac silhouette. There is patchy airspace disease within the left lower lobe consistent with bronchopneumonia. No effusion or pneumothorax. No acute bony abnormalities. IMPRESSION: 1. Patchy left lower lobe bronchopneumonia. Electronically Signed   By: Ozell Daring M.D.   On: 12/10/2023 15:21   DG Forearm Left Result Date: 12/10/2023 CLINICAL DATA:  Forearm and wrist pain EXAM: LEFT FOREARM - 2 VIEW COMPARISON:  None Available. FINDINGS: There is no evidence of fracture or other focal bone lesions. Soft tissues are unremarkable. IMPRESSION: Negative. Electronically Signed   By: Michaeline Blanch M.D.   On: 12/10/2023 13:53   DG Foot Complete Right Result Date: 11/29/2023 Please see detailed radiograph report in office note.  DG Foot Complete Left Result Date:  11/29/2023 Please see detailed radiograph report in office note.   Microbiology: Results for orders placed or performed during the hospital encounter of 12/10/23  SARS Coronavirus 2 by RT PCR (hospital order, performed in [facility] hospital lab) *cepheid single result test* Anterior Nasal Swab     Status: None   Collection Time: 12/11/23 10:00 AM   Specimen: Anterior Nasal Swab  Result Value Ref Range Status   SARS Coronavirus 2 by RT PCR NEGATIVE NEGATIVE Final    Comment: (NOTE) SARS-CoV-2 target nucleic acids are NOT DETECTED.  The SARS-CoV-2 RNA is generally detectable in upper and lower respiratory specimens during the acute phase of infection. The lowest concentration of SARS-CoV-2 viral copies this assay can detect is 250  copies / mL. A negative result does not preclude SARS-CoV-2 infection and should not be used as the sole basis for treatment or other patient management decisions.  A negative result may occur with improper specimen collection / handling, submission of specimen other than nasopharyngeal swab, presence of viral mutation(s) within the areas targeted by this assay, and inadequate number of viral copies (<250 copies / mL). A negative result must be combined with clinical observations, patient history, and epidemiological information.  Fact Sheet for Patients:   RoadLapTop.co.za  Fact Sheet for Healthcare Providers: http://kim-miller.com/  This test is not yet approved or  cleared by the [place]  FDA and has been authorized for detection and/or diagnosis of SARS-CoV-2 by FDA under an Emergency Use Authorization (EUA).  This EUA will remain in effect (meaning this test can be used) for the duration of the COVID-19 declaration under Section 564(b)(1) of the Act, 21 U.S.C. section 360bbb-3(b)(1), unless the authorization is terminated or revoked sooner.  Performed at [facility], [address]  [place], [place] [address]   Respiratory (~20 pathogens) panel by PCR     Status: None   Collection Time: 12/11/23 10:00 AM   Specimen: Nasopharyngeal Swab; Respiratory  Result Value Ref Range Status   Adenovirus NOT DETECTED NOT DETECTED Final   Coronavirus 229E NOT DETECTED NOT DETECTED Final    Comment: (NOTE) The Coronavirus on the Respiratory Panel, DOES NOT test for the novel  Coronavirus (2019 nCoV)    Coronavirus HKU1 NOT DETECTED NOT DETECTED Final   Coronavirus NL63 NOT DETECTED NOT DETECTED Final   Coronavirus OC43 NOT DETECTED NOT DETECTED Final   Metapneumovirus NOT DETECTED NOT DETECTED Final   Rhinovirus / Enterovirus NOT DETECTED NOT DETECTED Final   Influenza A NOT DETECTED NOT DETECTED Final   Influenza B NOT DETECTED NOT DETECTED Final   Parainfluenza Virus 1 NOT DETECTED NOT DETECTED Final   Parainfluenza Virus 2 NOT DETECTED NOT DETECTED Final   Parainfluenza Virus 3 NOT DETECTED NOT DETECTED Final   Parainfluenza Virus 4 NOT DETECTED NOT DETECTED Final   Respiratory Syncytial Virus NOT DETECTED NOT DETECTED Final   Bordetella pertussis NOT DETECTED NOT DETECTED Final   Bordetella Parapertussis NOT DETECTED NOT DETECTED Final   Chlamydophila pneumoniae NOT DETECTED NOT DETECTED Final   Mycoplasma pneumoniae NOT DETECTED NOT DETECTED Final    Comment: Performed at [facility], [address], [place], [place] [address]    Labs: CBC: Recent Labs  Lab 12/10/23 1404 12/11/23 0424 12/12/23 0554 12/13/23 0338  WBC 5.7 3.8* 4.6 4.0  NEUTROABS 4.8  --   --   --   HGB 9.4* 8.8* 9.0* 8.5*  HCT 25.7* 28.1* 24.6* 23.6*  MCV 98.8 93.4 98.8 95.9  PLT 78* 70* 82* 108*   Basic Metabolic Panel: Recent Labs  Lab 12/10/23 1404 12/12/23 0554 12/13/23 0338  NA 137 138 135  K 3.9 4.8 4.8  CL 96* 98 96*  CO2 25 23 27   GLUCOSE 124* 177* 260*  BUN 45* 69* 59*  CREATININE 6.99* 9.93* 6.91*  CALCIUM  8.5* 8.0* 8.2*   Liver Function Tests: Recent Labs  Lab  12/10/23 1404  AST 41  ALT 8  ALKPHOS 60  BILITOT 0.9  PROT 7.3  ALBUMIN  3.1*   CBG: Recent Labs  Lab 12/12/23 1143 12/12/23 1632 12/12/23 2053 12/13/23 0726 12/13/23 1138  GLUCAP 152* 161* 270* 217* 184*    Discharge time spent: greater than 30 minutes.  Signed: Blayne Frankie  Tobie, MD [facility] 12/13/2023

## 2023-12-13 NOTE — Plan of Care (Signed)
  Problem: Coping: Goal: Ability to adjust to condition or change in health will improve Outcome: Progressing   Problem: Health Behavior/Discharge Planning: Goal: Ability to manage health-related needs will improve Outcome: Progressing   Problem: Skin Integrity: Goal: Risk for impaired skin integrity will decrease Outcome: Progressing   Problem: Clinical Measurements: Goal: Ability to maintain clinical measurements within normal limits will improve Outcome: Progressing

## 2023-12-15 ENCOUNTER — Emergency Department

## 2023-12-15 ENCOUNTER — Other Ambulatory Visit: Payer: Self-pay

## 2023-12-15 ENCOUNTER — Emergency Department
Admission: EM | Admit: 2023-12-15 | Discharge: 2023-12-15 | Disposition: A | Discharge status: Home / Self Care | Attending: Emergency Medicine | Admitting: Emergency Medicine

## 2023-12-15 DIAGNOSIS — D761 Hemophagocytic lymphohistiocytosis: Secondary | ICD-10-CM | POA: Diagnosis present

## 2023-12-15 DIAGNOSIS — Z7951 Long term (current) use of inhaled steroids: Secondary | ICD-10-CM

## 2023-12-15 DIAGNOSIS — Z5329 Procedure and treatment not carried out because of patient's decision for other reasons: Secondary | ICD-10-CM | POA: Diagnosis not present

## 2023-12-15 DIAGNOSIS — E1122 Type 2 diabetes mellitus with diabetic chronic kidney disease: Secondary | ICD-10-CM | POA: Insufficient documentation

## 2023-12-15 DIAGNOSIS — M3219 Other organ or system involvement in systemic lupus erythematosus: Secondary | ICD-10-CM | POA: Diagnosis not present

## 2023-12-15 DIAGNOSIS — J45909 Unspecified asthma, uncomplicated: Secondary | ICD-10-CM | POA: Diagnosis present

## 2023-12-15 DIAGNOSIS — N186 End stage renal disease: Secondary | ICD-10-CM | POA: Insufficient documentation

## 2023-12-15 DIAGNOSIS — I951 Orthostatic hypotension: Secondary | ICD-10-CM | POA: Diagnosis present

## 2023-12-15 DIAGNOSIS — R799 Abnormal finding of blood chemistry, unspecified: Secondary | ICD-10-CM

## 2023-12-15 DIAGNOSIS — Z90711 Acquired absence of uterus with remaining cervical stump: Secondary | ICD-10-CM

## 2023-12-15 DIAGNOSIS — K861 Other chronic pancreatitis: Secondary | ICD-10-CM | POA: Diagnosis present

## 2023-12-15 DIAGNOSIS — Z6826 Body mass index (BMI) 26.0-26.9, adult: Secondary | ICD-10-CM

## 2023-12-15 DIAGNOSIS — E1142 Type 2 diabetes mellitus with diabetic polyneuropathy: Secondary | ICD-10-CM | POA: Diagnosis present

## 2023-12-15 DIAGNOSIS — R5081 Fever presenting with conditions classified elsewhere: Secondary | ICD-10-CM | POA: Diagnosis present

## 2023-12-15 DIAGNOSIS — Z91040 Latex allergy status: Secondary | ICD-10-CM

## 2023-12-15 DIAGNOSIS — R197 Diarrhea, unspecified: Secondary | ICD-10-CM | POA: Diagnosis present

## 2023-12-15 DIAGNOSIS — Z992 Dependence on renal dialysis: Secondary | ICD-10-CM | POA: Diagnosis not present

## 2023-12-15 DIAGNOSIS — Z8661 Personal history of infections of the central nervous system: Secondary | ICD-10-CM

## 2023-12-15 DIAGNOSIS — I120 Hypertensive chronic kidney disease with stage 5 chronic kidney disease or end stage renal disease: Secondary | ICD-10-CM | POA: Insufficient documentation

## 2023-12-15 DIAGNOSIS — E785 Hyperlipidemia, unspecified: Secondary | ICD-10-CM | POA: Diagnosis present

## 2023-12-15 DIAGNOSIS — R918 Other nonspecific abnormal finding of lung field: Secondary | ICD-10-CM | POA: Diagnosis present

## 2023-12-15 DIAGNOSIS — Z90722 Acquired absence of ovaries, bilateral: Secondary | ICD-10-CM

## 2023-12-15 DIAGNOSIS — D61818 Other pancytopenia: Secondary | ICD-10-CM | POA: Diagnosis present

## 2023-12-15 DIAGNOSIS — R7989 Other specified abnormal findings of blood chemistry: Secondary | ICD-10-CM | POA: Diagnosis not present

## 2023-12-15 DIAGNOSIS — R531 Weakness: Secondary | ICD-10-CM

## 2023-12-15 DIAGNOSIS — Z888 Allergy status to other drugs, medicaments and biological substances status: Secondary | ICD-10-CM

## 2023-12-15 DIAGNOSIS — G8929 Other chronic pain: Secondary | ICD-10-CM | POA: Diagnosis present

## 2023-12-15 DIAGNOSIS — I7782 Antineutrophilic cytoplasmic antibody (ANCA) vasculitis: Secondary | ICD-10-CM | POA: Diagnosis present

## 2023-12-15 DIAGNOSIS — Z856 Personal history of leukemia: Secondary | ICD-10-CM | POA: Diagnosis not present

## 2023-12-15 DIAGNOSIS — Z79899 Other long term (current) drug therapy: Secondary | ICD-10-CM

## 2023-12-15 DIAGNOSIS — L89310 Pressure ulcer of right buttock, unstageable: Secondary | ICD-10-CM | POA: Diagnosis not present

## 2023-12-15 DIAGNOSIS — Z79890 Hormone replacement therapy (postmenopausal): Secondary | ICD-10-CM

## 2023-12-15 DIAGNOSIS — F32A Depression, unspecified: Secondary | ICD-10-CM | POA: Diagnosis present

## 2023-12-15 DIAGNOSIS — E11649 Type 2 diabetes mellitus with hypoglycemia without coma: Secondary | ICD-10-CM | POA: Diagnosis not present

## 2023-12-15 DIAGNOSIS — Z794 Long term (current) use of insulin: Secondary | ICD-10-CM

## 2023-12-15 DIAGNOSIS — H9202 Otalgia, left ear: Secondary | ICD-10-CM | POA: Diagnosis present

## 2023-12-15 DIAGNOSIS — F419 Anxiety disorder, unspecified: Secondary | ICD-10-CM | POA: Diagnosis present

## 2023-12-15 DIAGNOSIS — T380X5A Adverse effect of glucocorticoids and synthetic analogues, initial encounter: Secondary | ICD-10-CM | POA: Diagnosis not present

## 2023-12-15 DIAGNOSIS — L89153 Pressure ulcer of sacral region, stage 3: Secondary | ICD-10-CM | POA: Diagnosis not present

## 2023-12-15 DIAGNOSIS — Z880 Allergy status to penicillin: Secondary | ICD-10-CM

## 2023-12-15 DIAGNOSIS — Z8701 Personal history of pneumonia (recurrent): Secondary | ICD-10-CM

## 2023-12-15 DIAGNOSIS — E782 Mixed hyperlipidemia: Secondary | ICD-10-CM | POA: Diagnosis present

## 2023-12-15 DIAGNOSIS — A6009 Herpesviral infection of other urogenital tract: Secondary | ICD-10-CM | POA: Diagnosis present

## 2023-12-15 DIAGNOSIS — I953 Hypotension of hemodialysis: Secondary | ICD-10-CM | POA: Diagnosis present

## 2023-12-15 DIAGNOSIS — R627 Adult failure to thrive: Secondary | ICD-10-CM | POA: Diagnosis present

## 2023-12-15 DIAGNOSIS — E039 Hypothyroidism, unspecified: Secondary | ICD-10-CM | POA: Diagnosis present

## 2023-12-15 DIAGNOSIS — N2581 Secondary hyperparathyroidism of renal origin: Secondary | ICD-10-CM | POA: Diagnosis present

## 2023-12-15 DIAGNOSIS — I10 Essential (primary) hypertension: Secondary | ICD-10-CM | POA: Diagnosis present

## 2023-12-15 DIAGNOSIS — Z881 Allergy status to other antibiotic agents status: Secondary | ICD-10-CM

## 2023-12-15 DIAGNOSIS — N19 Unspecified kidney failure: Secondary | ICD-10-CM

## 2023-12-15 DIAGNOSIS — R4701 Aphasia: Secondary | ICD-10-CM | POA: Diagnosis present

## 2023-12-15 DIAGNOSIS — R509 Fever, unspecified: Secondary | ICD-10-CM | POA: Diagnosis present

## 2023-12-15 DIAGNOSIS — E440 Moderate protein-calorie malnutrition: Secondary | ICD-10-CM | POA: Diagnosis present

## 2023-12-15 DIAGNOSIS — K219 Gastro-esophageal reflux disease without esophagitis: Secondary | ICD-10-CM | POA: Diagnosis present

## 2023-12-15 DIAGNOSIS — E1165 Type 2 diabetes mellitus with hyperglycemia: Secondary | ICD-10-CM | POA: Diagnosis not present

## 2023-12-15 DIAGNOSIS — Z751 Person awaiting admission to adequate facility elsewhere: Secondary | ICD-10-CM

## 2023-12-15 DIAGNOSIS — D631 Anemia in chronic kidney disease: Secondary | ICD-10-CM | POA: Diagnosis present

## 2023-12-15 DIAGNOSIS — G40909 Epilepsy, unspecified, not intractable, without status epilepticus: Secondary | ICD-10-CM | POA: Diagnosis present

## 2023-12-15 DIAGNOSIS — M791 Myalgia, unspecified site: Secondary | ICD-10-CM | POA: Diagnosis present

## 2023-12-15 DIAGNOSIS — Z91041 Radiographic dye allergy status: Secondary | ICD-10-CM

## 2023-12-15 DIAGNOSIS — Z66 Do not resuscitate: Secondary | ICD-10-CM | POA: Diagnosis present

## 2023-12-15 DIAGNOSIS — Z803 Family history of malignant neoplasm of breast: Secondary | ICD-10-CM

## 2023-12-15 DIAGNOSIS — J029 Acute pharyngitis, unspecified: Secondary | ICD-10-CM | POA: Diagnosis present

## 2023-12-15 DIAGNOSIS — E860 Dehydration: Secondary | ICD-10-CM | POA: Diagnosis present

## 2023-12-15 DIAGNOSIS — Z1152 Encounter for screening for COVID-19: Secondary | ICD-10-CM

## 2023-12-15 DIAGNOSIS — C9111 Chronic lymphocytic leukemia of B-cell type in remission: Secondary | ICD-10-CM | POA: Diagnosis present

## 2023-12-15 DIAGNOSIS — G053 Encephalitis and encephalomyelitis in diseases classified elsewhere: Secondary | ICD-10-CM | POA: Diagnosis present

## 2023-12-15 DIAGNOSIS — R4182 Altered mental status, unspecified: Secondary | ICD-10-CM | POA: Diagnosis not present

## 2023-12-15 DIAGNOSIS — G9341 Metabolic encephalopathy: Secondary | ICD-10-CM | POA: Diagnosis present

## 2023-12-15 LAB — CBC
HCT: 27.5 % — ABNORMAL LOW (ref 36.0–46.0)
Hemoglobin: 9.5 g/dL — ABNORMAL LOW (ref 12.0–15.0)
MCH: 33 pg (ref 26.0–34.0)
MCHC: 34.5 g/dL (ref 30.0–36.0)
MCV: 95.5 fL (ref 80.0–100.0)
Platelets: 53 K/uL — ABNORMAL LOW (ref 150–400)
RBC: 2.88 MIL/uL — ABNORMAL LOW (ref 3.87–5.11)
RDW: 19.9 % — ABNORMAL HIGH (ref 11.5–15.5)
WBC: 4.6 K/uL (ref 4.0–10.5)
nRBC: 0 % (ref 0.0–0.2)

## 2023-12-15 LAB — BASIC METABOLIC PANEL WITH GFR
Anion gap: 20 — ABNORMAL HIGH (ref 5–15)
BUN: 100 mg/dL — ABNORMAL HIGH (ref 6–20)
CO2: 21 mmol/L — ABNORMAL LOW (ref 22–32)
Calcium: 8.2 mg/dL — ABNORMAL LOW (ref 8.9–10.3)
Chloride: 96 mmol/L — ABNORMAL LOW (ref 98–111)
Creatinine, Ser: 10.61 mg/dL — ABNORMAL HIGH (ref 0.44–1.00)
GFR, Estimated: 4 mL/min — ABNORMAL LOW (ref >60)
Glucose, Bld: 118 mg/dL — ABNORMAL HIGH (ref 70–99)
Potassium: 4.2 mmol/L (ref 3.5–5.1)
Sodium: 137 mmol/L (ref 135–145)

## 2023-12-15 LAB — HEPATIC FUNCTION PANEL
ALT: 15 U/L (ref 0–44)
AST: 51 U/L — ABNORMAL HIGH (ref 15–41)
Albumin: 2.9 g/dL — ABNORMAL LOW (ref 3.5–5.0)
Alkaline Phosphatase: 63 U/L (ref 38–126)
Bilirubin, Direct: 0.1 mg/dL (ref 0.0–0.2)
Indirect Bilirubin: 1 mg/dL — ABNORMAL HIGH (ref 0.3–0.9)
Total Bilirubin: 1.1 mg/dL (ref 0.0–1.2)
Total Protein: 6.5 g/dL (ref 6.5–8.1)

## 2023-12-15 LAB — LIPASE, BLOOD: Lipase: 85 U/L — ABNORMAL HIGH (ref 11–51)

## 2023-12-15 LAB — TROPONIN I (HIGH SENSITIVITY): Troponin I (High Sensitivity): 51 ng/L — ABNORMAL HIGH (ref <18)

## 2023-12-15 LAB — CYCLIC CITRUL PEPTIDE ANTIBODY, IGG/IGA: CCP Antibodies IgG/IgA: 10 U (ref 0–19)

## 2023-12-15 MED ORDER — ONDANSETRON 4 MG PO TBDP: 4.0000 mg | ORAL_TABLET | Freq: Once | ORAL | Status: DC

## 2023-12-15 MED ORDER — SODIUM CHLORIDE 0.9 % IV BOLUS: 250.0000 mL | Freq: Once | INTRAVENOUS | Status: DC

## 2023-12-15 MED ORDER — TRAMADOL HCL 50 MG PO TABS
50.0000 mg | ORAL_TABLET | Freq: Once | ORAL | Status: AC
  Administered 2023-12-15: 50 mg via ORAL
  Filled 2023-12-15: qty 1

## 2023-12-15 MED ORDER — OXYCODONE HCL 5 MG PO TABS
5.0000 mg | ORAL_TABLET | Freq: Once | ORAL | Status: AC
  Administered 2023-12-15: 5 mg via ORAL
  Filled 2023-12-15: qty 1

## 2023-12-15 MED ORDER — CHLORHEXIDINE GLUCONATE CLOTH 2 % EX PADS: 6.0000 | MEDICATED_PAD | Freq: Every day | CUTANEOUS | Status: DC

## 2023-12-15 MED ORDER — ACETAMINOPHEN 325 MG PO TABS
650.0000 mg | ORAL_TABLET | Freq: Once | ORAL | Status: AC
  Administered 2023-12-15: 650 mg via ORAL
  Filled 2023-12-15: qty 2

## 2023-12-15 NOTE — Assessment & Plan Note (Addendum)
 Recommend insulin  SSI with at bedtime coverage, end-stage renal disease dosing PDMP reviewed Patient on gabapentin  100 mg, 90-day supply; tramadol  50 mg, 90 tablets 30-day supply written on 5/8 and filled on 09/05/2023

## 2023-12-15 NOTE — ED Provider Notes (Addendum)
 [facility] Provider Note    Event Date/Time   First MD Initiated Contact with Patient 12/15/23 1040     (approximate)   History   Generalized Body Aches   HPI  Tonya Myers is a 58 y.o. female with ESRD on dialysis, CLL in remission, hypertension, diabetes.    I reviewed where patient was admitted to the hospital and discharged on 8/19 after being admitted on 8/16. Pt   I reviewed a note from 12/04/2023 where patient has chronic painful neuropathy pain from her diabetes.  Attempted calling aunt- no answer Attempted calling daughter no answer  Patient is tearful stating that she has pain all over her body.  States that she has not picked up her antibiotics or gone to dialysis just because she does not feel well.  Patient reports that most the pain is just in her hands and her feet but on review of records it sounds like she has had that previously.  When I reviewed the hospital admission it appears that patient was getting some oxycodone  when inpatient.  She denies any falls or hitting her head.  Physical Exam   Triage Vital Signs: ED Triage Vitals [12/15/23 0955]  Encounter Vitals Group     BP (!) 113/90     Girls Systolic BP Percentile      Girls Diastolic BP Percentile      Boys Systolic BP Percentile      Boys Diastolic BP Percentile      Pulse Rate 86     Resp 16     Temp 98.1 F (36.7 C)     Temp Source Oral     SpO2 98 %     Weight      Height      Head Circumference      Peak Flow      Pain Score 10     Pain Loc      Pain Education      Exclude from Growth Chart     Most recent vital signs: Vitals:   12/15/23 0955  BP: (!) 113/90  Pulse: 86  Resp: 16  Temp: 98.1 F (36.7 C)  SpO2: 98%     General: Awake, \patient is tearful CV:  Good peripheral perfusion.  Resp:  Normal effort.  Abd:  No distention.  Nontender Other:  Patient is tearful, spontaneously moving her arms.  Will to stand up and walk to the bed  per nursing staff. Fistula noted to the right arm  ED Results / Procedures / Treatments   Labs (all labs ordered are listed, but only abnormal results are displayed) Labs Reviewed  BASIC METABOLIC PANEL WITH GFR  CBC  HEPATIC FUNCTION PANEL  LIPASE, BLOOD  TROPONIN I (HIGH SENSITIVITY)  TROPONIN I (HIGH SENSITIVITY)     EKG  My interpretation of EKG:  Normal sinus rate of 82 without any ST elevation or T wave inversions, normal intervals  RADIOLOGY I have reviewed the xray personally and interpreted lungs are now reassuring   PROCEDURES:  Critical Care performed: No  Procedures   MEDICATIONS ORDERED IN ED: Medications - No data to display   IMPRESSION / MDM / ASSESSMENT AND PLAN / ED COURSE  I reviewed the triage vital signs and the nursing notes.   Patient's presentation is most consistent with acute presentation with potential threat to life or bodily function.   Patient comes in with bodyaches all over she has not been taking her antibiotics  as prescribed she is afebrile not hypoxic her x-ray does show no edema she reports feeling dehydrated she is a dialysis patient so we will give a very small amount of fluids.  Will check labs to evaluate for Electra abnormalities.  Will give a dose of oxycodone  given she was given this inpatient to see if this helps with her symptoms.  Attempted calling family but no answer.  Patient was initially planned to be discharged to [facility] but then patient declined after looking at [employer] reviews and ended up going home and went home with home health.  Patient lives alone  I did talk to a family member who stated that patient asked like this sometimes what her BUN gets elevated and she has missed dialysis.  They do report that she does not have a legal guardian that she makes her own medical decisions.  They stated that there is somebody who is currently staying at the house and she thought that that they actually did pick up  the medications for the pneumonia were taking them.  The hospitalist went by the room and patient was playing on her phone and seemed in no acute distress.  There was question that if patient can get dialysis today then can patient follow-up when I went to go reevaluate the patient said that she was feeling better and felt little nauseous.  Repeat abdominal exam soft and nontender.  Will give 4 of Zofran .  She is moving all extremities well.  She denies any abdominal pain.  The hospitalist requested to see if patient could get dialysis done today and if so then patient can be discharged home from the ER versus admission if unable to do dialysis today.  Patient handed off pending oncoming team pending dialysis and reassessment   FINAL CLINICAL IMPRESSION(S) / ED DIAGNOSES   Final diagnoses:  Elevated BUN  ESRD (end stage renal disease) on dialysis [facility])     Rx / DC Orders   ED Discharge Orders     None        Note:  This document was prepared using Dragon voice recognition software and may include unintentional dictation errors.   Ernest Ronal BRAVO, MD 12/15/23 1502    Ernest Ronal BRAVO, MD 12/15/23 [phone]

## 2023-12-15 NOTE — Assessment & Plan Note (Signed)
 Patient on atorvastatin  20 mg daily

## 2023-12-15 NOTE — ED Notes (Signed)
 First Nurse Note: Pt to ED via [facility] from home for generalized pain. Pt d/c'ed on Tuesday, pt was being treated for pneumonia. Pt has been having pain since she was discharged. Missed dialysis yesterday due to pain. Vital signs stable with EMS.

## 2023-12-15 NOTE — ED Notes (Addendum)
 IV team RN reached out to RN about pt needing an IV. RN explained that pt is going to be admitted and had fluids ordered but then D/C due to pt needing a dialysis pt. IV team RN Dorie sts  Since she's a dialysis patient that means we have limited options at baseline so we need to preserve what we have. If orders change, we can re-evaluate but right now there isn't an indication for an IV.

## 2023-12-15 NOTE — Assessment & Plan Note (Addendum)
 Secondary to patient missing dialysis Discussed with nephrology, they will send dialysis nurse to come to her dialysis the patient Encourage patient to intake nutritious food that is renally appropriate in order to maintain strength Discussed with EDP, recommend dialysis and recommend discharge home from ED

## 2023-12-15 NOTE — ED Triage Notes (Signed)
 Pt to ED via POV from home. Pt reports was dx with PNA a week ago. Pt reports continued SOB and generalized body aches. Pt reports pain is worse in her hands and feet. Hx of neuropathy.

## 2023-12-15 NOTE — Assessment & Plan Note (Signed)
 History of strep pneumonia meningitis status post multilevel laminectomy

## 2023-12-15 NOTE — Assessment & Plan Note (Signed)
 Generalized secondary to multifactorial end-stage renal disease with poor compliance of hemodialysis and poor p.o. intake in a patient with end-stage renal disease on hemodialysis and chronic nausea and poor appetite The patient was admitted on 12/10/2023 to 12/13/2023, PT, OT-consulted and evaluated patient and recommended rehab.  Patient declined to go to rehab at that time and requested to go home with home health.  A walker and home health has been arranged by discharging provider on 12/13/2023.

## 2023-12-15 NOTE — Consult Note (Addendum)
 Initial Consultation Note   Patient: Tonya Myers FMW:[id number] DOB: 09/21/1965 PCP: Valora Agent, MD DOA: 12/15/2023 DOS: the patient was seen and examined on 12/15/2023 Primary service: No att. providers found  Referring physician: Dr. Ernest Reason for consult: weakness   HPI: 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  Patient states that since she was discharged from the hospital she has felt this way.  She has had poor p.o. intake.  Vitals in the ED showed t of 98.1, rr 16, heart rate 86, blood pressure 113/90, SpO2 98% on room air.  Serum sodium is 137, potassium 4.2, chloride 96, bicarb 21, BUN 100, serum creatinine of 10.61, EGFR 4, nonfasting blood glucose 118, WBC 4.6, hemoglobin 9.5, platelets of 53.  Lipase 85.  AST 5.  ALT 15.  HS troponin is 51.  ED treatment: Oxycodone  5 mg p.o. one-time dose.  EDP discussed with nephrology who states that they will dialyze patient as soon as staff is available. --------------------------------- At bedside, patient was able to tell me her first and last name, her age, location, current calendar year.  She reports that she hurts all over.  She reports weakness.  She reports the pain and weakness has been ongoing.  She denies known trauma to her person.  She reports she does not eat a lot.  She reports she does not have appetite and has chronic nausea.  She reports the nausea is worse after dialysis.  She denies fever, chills, cough, chest pain, shortness of breath, abdominal pain, diarrhea.  Social history: She lives at home on her own.  She denies tobacco, EtOH, recreational drug  use.  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  ROS: Constitutional: no weight change, no fever ENT/Mouth: no sore throat, no rhinorrhea Eyes: no eye pain, no vision changes Cardiovascular: no chest pain, no dyspnea,  no edema, no palpitations Respiratory: no cough, no sputum, no wheezing Gastrointestinal: + nausea, no vomiting, no diarrhea, no constipation Genitourinary: no urinary incontinence, no dysuria, no hematuria Musculoskeletal: no arthralgias, + generalized myalgias Skin: no skin lesions, no pruritus, Neuro: + weakness, no loss of consciousness, no syncope Psych: no anxiety, no depression, + decrease appetite Heme/Lymph: no bruising, no bleeding  ED Course: Discussed with EDP, requesting hospitalization for chief concerns of generalized weakness requiring dialysis.  Assessment/Plan  Principal Problem:   Uremia of renal origin Active Problems:   Hypothyroidism   Type 2 diabetes mellitus with peripheral neuropathy (HCC)   Dyslipidemia   End-stage renal disease on hemodialysis (HCC)   Anemia in ESRD (end-stage renal disease) (HCC)   Essential hypertension   Hyperlipidemia, mixed   Protein-calorie malnutrition, moderate (HCC)   History of meningitis   Weakness   Assessment and Plan:  * Uremia of renal origin Secondary to patient missing dialysis Discussed with nephrology, they will send dialysis nurse to come to her dialysis the patient Encourage patient to intake nutritious food that is renally appropriate in order to maintain strength Discussed with EDP, recommend dialysis and recommend discharge home from ED  Type 2 diabetes mellitus with peripheral neuropathy (HCC) Recommend insulin  SSI with at bedtime coverage, end-stage renal disease dosing PDMP reviewed Patient on gabapentin  100 mg, 90-day supply; tramadol  50 mg, 90 tablets  30-day supply written on 5/8 and filled on 09/05/2023  Hypothyroidism Recommend resuming home levothyroxine  125 mcg  daily resumed  Weakness Generalized secondary to multifactorial end-stage renal disease with poor compliance of hemodialysis and poor p.o. intake in a patient with end-stage renal disease on hemodialysis and chronic nausea and poor appetite The patient was admitted on 12/10/2023 to 12/13/2023, PT, OT-consulted and evaluated patient and recommended rehab.  Patient declined to go to rehab at that time and requested to go home with home health.  A walker and home health has been arranged by discharging provider on 12/13/2023.  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  No signs of pain exhibited at bedside and during my evaluation. -Vitals at bedside had heart rate of 73, SpO2 100% on room air, respiration rate of 12, blood pressure 123/112, patient does not appear to be in acute distress. -Recommend compliance with hemodialysis and follow-up with PCP as appropriate with consideration for possible fibromyalgia and the appropriate treatment  Chart reviewed.   DVT prophylaxis: Ambulate as tolerated Code Status: Full code Diet: Recommend renal diet Family Communication: No Disposition Plan: Pending dialysis Consults called: Nephrology Admission status: Decline  Past Medical History:  Diagnosis Date   Acute hemorrhoid 03/11/2015   Anxiety    Arthritis    Asthma    Cancer (HCC)    Chronic back pain    CLL (chronic lymphocytic leukemia) (HCC)    Depression    Diabetes mellitus without complication (HCC)    Genital herpes    type 2   Hypertension    Hypothyroidism    Renal disorder    Vertigo    Past Surgical History:  Procedure Laterality Date   ABDOMINAL  HYSTERECTOMY     BILATERAL SALPINGOOPHORECTOMY  2009   BREAST BIOPSY Right 05/17/2016   FIBROADENOMATOUS CHANGE AND SCLEROSING ADENOSIS WITH   COLONOSCOPY WITH PROPOFOL  N/A 06/16/2015   Procedure: COLONOSCOPY WITH PROPOFOL ;  Surgeon: Donnice Vaughn Manes, MD;  Location: [facility] ENDOSCOPY;  Service: Endoscopy;  Laterality: N/A;   DIALYSIS/PERMA CATHETER INSERTION N/A 12/17/2020   Procedure: DIALYSIS/PERMA CATHETER INSERTION;  Surgeon: Marea Selinda RAMAN, MD;  Location: [facility] INVASIVE CV LAB;  Service: Cardiovascular;  Laterality: N/A;   LAPAROSCOPIC SUPRACERVICAL HYSTERECTOMY  2009   due to leio   OOPHORECTOMY     TEMPORARY DIALYSIS CATHETER N/A 12/10/2020   Procedure: TEMPORARY DIALYSIS CATHETER;  Surgeon: Jama Cordella MATSU, MD;  Location: [facility] INVASIVE CV LAB;  Service: Cardiovascular;  Laterality: N/A;   THORACIC LAMINECTOMY FOR EPIDURAL ABSCESS Bilateral 06/17/2020   Procedure: THORACIC LAMINECTOMY FOR EPIDURAL ABSCESS;  Surgeon: Bluford Standing, MD;  Location: [facility] ORS;  Service: Neurosurgery;  Laterality: Bilateral;   Social History:  reports that she has never smoked. She has never used smokeless tobacco. She reports that she does not currently use alcohol after a past usage of about 1.0 standard drink of alcohol per week. She reports that she does not use drugs.  Allergies  Allergen Reactions   Amoxapine     Other Reaction(s): Unknown   Latex Other (See Comments)   Ceftazidime     Other reaction(s): Confusion, Hallucination, Unknown Encephalopathy - improved after dialysis/some concern for cephalosporin neurotoxicity Tolerated without confusion 06/2021. Encephalopathy - improved after dialysis/some  concern for cephalosporin neurotoxicity Tolerated without confusion 06/2021.    Iodinated Contrast Media Hives and Rash    Other Reaction(s): Hives   Penicillins     Other Reaction(s): Unknown   Family History  Problem Relation Age of Onset   Cancer Paternal Aunt    Breast cancer Maternal Aunt 72    Family history: Family history reviewed and not pertinent.  Prior to Admission medications   Medication Sig Start Date End Date Taking? Authorizing Provider  acetaminophen  (TYLENOL ) 325 MG tablet Take 2 tablets (650 mg total) by mouth every 6 (six) hours as needed for mild pain (or Fever >/= 101). 07/30/20  Yes Angiulli, Toribio PARAS, PA-C  albuterol  (VENTOLIN  HFA) 108 (90 Base) MCG/ACT inhaler Inhale 2 puffs into the lungs every 6 (six) hours as needed for wheezing or shortness of breath. 10/18/21  Yes [provider]  amitriptyline  (ELAVIL ) 25 MG tablet Take 1 tablet (25 mg total) by mouth at bedtime. 12/06/23  Yes Marcelino Nurse, MD  atorvastatin  (LIPITOR) 20 MG tablet Take 20 mg by mouth daily. 10/18/21  Yes [provider]  cetirizine (ZYRTEC) 10 MG tablet Take 1 tablet by mouth daily. 07/07/23  Yes [provider]  fluticasone  (FLONASE ) 50 MCG/ACT nasal spray Place 2 sprays into both nostrils daily.   Yes [provider]  folic acid  (FOLVITE ) 1 MG tablet Take 1 tablet by mouth daily. 10/18/21  Yes [provider]  guaiFENesin -dextromethorphan  (ROBITUSSIN DM) 100-10 MG/5ML syrup Take 5 mLs by mouth every 4 (four) hours as needed for cough. 12/13/23  Yes Patel, Sona, MD  hydrOXYzine  (ATARAX ) 25 MG tablet Take 1 tablet (25 mg total) by mouth 3 (three) times daily as needed for itching. 08/19/23  Yes Von Bellis, MD  levothyroxine  (SYNTHROID ) 125 MCG tablet Take 125 mcg by mouth daily. Take 1 tablet daily except Sunday take 1 and 1/2 tablet (187.5 mg). 12/08/21  Yes [provider]  lipase/protease/amylase (CREON ) 12000-38000 units CPEP capsule Take 1 capsule by mouth. 09/06/23  Yes [provider]  midodrine  (PROAMATINE ) 2.5 MG tablet Take 2.5 mg by mouth as directed. Only on dialysis days. (Monday Wednesday and Friday) 11/24/23  Yes [provider]  multivitamin (RENA-VIT) TABS tablet Take 1 tablet by mouth at bedtime. 12/22/21  Yes  Sreenath, Sudheer B, MD  ondansetron  (ZOFRAN -ODT) 8 MG disintegrating tablet Take 1 tablet (8 mg total) by mouth every 8 (eight) hours as needed for nausea or vomiting. 11/04/23  Yes Jacobo Evalene PARAS, MD  pantoprazole  (PROTONIX ) 40 MG tablet Take 1 tablet (40 mg total) by mouth 2 (two) times daily for 28 days, THEN 1 tablet (40 mg total) daily. 08/19/23 06/11/26 Yes Von Bellis, MD  predniSONE  (DELTASONE ) 20 MG tablet Take 2 tablets (40 mg total) by mouth daily with breakfast for 2 days. 12/14/23 12/16/23 Yes Patel, Sona, MD  traMADol  (ULTRAM ) 50 MG tablet Take 1 tablet (50 mg total) by mouth at bedtime. 12/13/23  Yes Patel, Sona, MD  valACYclovir  (VALTREX ) 500 MG tablet Take 500 mg by mouth as directed. Take 1 tablet daily on dialysis days.(Monday, Wednesday and Friday)   Yes [provider]  zinc  sulfate 220 (50 Zn) MG capsule Take 1 capsule (220 mg total) by mouth daily. 12/23/21  Yes Sreenath, Sudheer B, MD  amLODipine  (NORVASC ) 5 MG tablet Take 1 tablet (5 mg total) by mouth daily. 12/13/23 12/12/24  Patel, Sona, MD  azithromycin  (ZITHROMAX ) 500 MG tablet Take 1 tablet (500 mg total) by mouth daily  for 2 days. 12/13/23 12/15/23  Patel, Sona, MD  Blood Glucose Monitoring Suppl DEVI 1 each by Does not apply route 3 (three) times daily. May dispense any manufacturer covered by patient's insurance. 08/19/23   Von Bellis, MD  calcium  acetate (PHOSLO ) 667 MG capsule Take 1,334 mg by mouth 3 (three) times daily with meals. Patient not taking: Reported on 12/10/2023 05/11/23 05/10/24  [provider]  cefUROXime  (CEFTIN ) 500 MG tablet Take 1 tablet (500 mg total) by mouth every Monday, Wednesday, and Friday with hemodialysis for 2 doses. 12/14/23 12/17/23  Patel, Sona, MD  Glucose Blood (BLOOD GLUCOSE TEST STRIPS) STRP 1 each by Does not apply route 3 (three) times daily. Use as directed to check blood sugar. May dispense any manufacturer covered by patient's insurance and fits patient's device.  08/19/23   Von Bellis, MD  Lancet Device MISC 1 each by Does not apply route 3 (three) times daily. May dispense any manufacturer covered by patient's insurance. 08/19/23   Von Bellis, MD  Lancets MISC 1 each by Does not apply route 3 (three) times daily. Use as directed to check blood sugar. May dispense any manufacturer covered by patient's insurance and fits patient's device. 08/19/23   Von Bellis, MD  budesonide-formoterol (SYMBICORT) 160-4.5 MCG/ACT inhaler Inhale into the lungs. 07/14/18 03/15/19  [provider]    Physical Exam: Vitals:   12/15/23 0955 12/15/23 1345  BP: (!) 113/90 (!) 142/99  Pulse: 86 89  Resp: 16 19  Temp: 98.1 F (36.7 C) 98.7 F (37.1 C)  TempSrc: Oral Oral  SpO2: 98% 98%   Constitutional: appears age-appropriate, NAD, calm Eyes: PERRL, lids and conjunctivae normal ENMT: Mucous membranes are moist. Posterior pharynx clear of any exudate or lesions. Age-appropriate dentition. Hearing appropriate Neck: normal, supple, no masses, no thyromegaly Respiratory: clear to auscultation bilaterally, no wheezing, no crackles. Normal respiratory effort. No accessory muscle use.  Cardiovascular: Regular rate and rhythm, no murmurs / rubs / gallops. No extremity edema. 2+ pedal pulses. No carotid bruits.  Abdomen: no tenderness, no masses palpated, no hepatosplenomegaly. Bowel sounds positive.  Musculoskeletal: no clubbing / cyanosis. No joint deformity upper and lower extremities. Good ROM, no contractures, no atrophy. Normal muscle tone.  Skin: no rashes, lesions, ulcers. No induration Neurologic: Sensation intact. Strength 5/5 in all 4.  Psychiatric: Normal judgment and insight. Alert and oriented x 3. Normal mood.   EKG: independently reviewed, showing sinus rhythm with rate of 82, QTc 475  Chest x-ray on Admission: I personally reviewed and I agree with radiologist reading as below.  DG Chest 2 View Result Date: 12/15/2023 CLINICAL DATA:  Shortness  of breath. EXAM: CHEST - 2 VIEW COMPARISON:  12/12/2023 FINDINGS: The lungs are clear without focal pneumonia, edema, pneumothorax or pleural effusion. Cardiopericardial silhouette is at upper limits of normal for size. No acute bony abnormality. Vascular stent device noted medial right arm. IMPRESSION: No active cardiopulmonary disease. Electronically Signed   By: Camellia Candle M.D.   On: 12/15/2023 10:32   Labs on Admission: I have personally reviewed following labs  CBC: Recent Labs  Lab 12/10/23 1404 12/11/23 0424 12/12/23 0554 12/13/23 0338 12/15/23 0957  WBC 5.7 3.8* 4.6 4.0 4.6  NEUTROABS 4.8  --   --   --   --   HGB 9.4* 8.8* 9.0* 8.5* 9.5*  HCT 25.7* 28.1* 24.6* 23.6* 27.5*  MCV 98.8 93.4 98.8 95.9 95.5  PLT 78* 70* 82* 108* 53*   Basic Metabolic Panel: Recent Labs  Lab 12/10/23 1404 12/12/23 0554 12/13/23 0338 12/15/23 0957  NA 137 138 135 137  K 3.9 4.8 4.8 4.2  CL 96* 98 96* 96*  CO2 25 23 27  21*  GLUCOSE 124* 177* 260* 118*  BUN 45* 69* 59* 100*  CREATININE 6.99* 9.93* 6.91* 10.61*  CALCIUM  8.5* 8.0* 8.2* 8.2*   GFR: Estimated Creatinine Clearance: 7.1 mL/min (A) (by C-G formula based on SCr of 10.61 mg/dL (H)).  Liver Function Tests: Recent Labs  Lab 12/10/23 1404 12/15/23 1130  AST 41 51*  ALT 8 15  ALKPHOS 60 63  BILITOT 0.9 1.1  PROT 7.3 6.5  ALBUMIN  3.1* 2.9*   Recent Labs  Lab 12/15/23 1130  LIPASE 85*   CBG: Recent Labs  Lab 12/12/23 1143 12/12/23 1632 12/12/23 2053 12/13/23 0726 12/13/23 1138  GLUCAP 152* 161* 270* 217* 184*   Urine analysis:    Component Value Date/Time   COLORURINE AMBER (A) 08/12/2023 1834   APPEARANCEUR CLOUDY (A) 08/12/2023 1834   LABSPEC 1.014 08/12/2023 1834   PHURINE 8.0 08/12/2023 1834   GLUCOSEU NEGATIVE 08/12/2023 1834   HGBUR SMALL (A) 08/12/2023 1834   BILIRUBINUR NEGATIVE 08/12/2023 1834   KETONESUR NEGATIVE 08/12/2023 1834   PROTEINUR >=300 (A) 08/12/2023 1834   NITRITE NEGATIVE  08/12/2023 1834   LEUKOCYTESUR MODERATE (A) 08/12/2023 1834   This document was prepared using Dragon Voice Recognition software and may include unintentional dictation errors.  Dr. Sherre [facility]  If 7PM-7AM, please contact overnight-coverage provider If 7AM-7PM, please contact day attending provider [email or web address]  12/15/2023, 4:05 PM

## 2023-12-15 NOTE — Assessment & Plan Note (Signed)
 Recommend resuming home levothyroxine  125 mcg daily resumed

## 2023-12-15 NOTE — ED Notes (Signed)
 Pt able to ambulate with no assistance from wheel chair to bed. Pt sts  O I am dying, I am not able to move my arms and legs. I am just so sick. Pt sts that she was able to get her rx that was prescribed by the hospitalist on 08/19. However pt sts that she was just so sick and was not able to take them.

## 2023-12-15 NOTE — Hospital Course (Addendum)
 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  Patient states that since she was discharged from the hospital she has felt this way.  She has had poor p.o. intake.  Vitals in the ED showed t of 98.1, rr 16, heart rate 86, blood pressure 113/90, SpO2 98% on room air.  Serum sodium is 137, potassium 4.2, chloride 96, bicarb 21, BUN 100, serum creatinine of 10.61, EGFR 4, nonfasting blood glucose 118, WBC 4.6, hemoglobin 9.5, platelets of 53.  Lipase 85.  AST 5.  ALT 15.  HS troponin is 51.  ED treatment: Oxycodone  5 mg p.o. one-time dose.  EDP discussed with nephrology who states that they will dialyze patient as soon as staff is available.

## 2023-12-15 NOTE — Assessment & Plan Note (Signed)
 Recommend renal diet Encourage patient to eat as this will give her strength given her ESRD status

## 2023-12-15 NOTE — ED Notes (Addendum)
 Pt ambulated with little to no assistance while using walker to toilet.

## 2023-12-15 NOTE — ED Notes (Signed)
Dialysis team at bedside.

## 2023-12-15 NOTE — Progress Notes (Signed)
 IVT consult placed 'stat' for peripheral IV. Patient has dialysis fistula in right arm. Patient was recently hospitalized and returns today due no improvement in her condition. Per primary RN patient is going to be admitted, however no IV medication orders noted. Fluid bolus order cancelled due to her dialysis needs. Given limited peripheral options AND recent hospitalization, it's recommended to preserve limited options available if possible. Of note, previous hospitalization required IV start in her left upper arm indicating that she already has poor venous access options available. Explained that if needs change, IV can be placed but there is no medical indication at this time. Secure chat sent to ED MD to make aware of recommendation. Awaiting response.   Damen Windsor R Amyia Lodwick, RN

## 2023-12-15 NOTE — ED Notes (Signed)
 Pt anxious and wishing to stop dialysis treatment at this time. MD made aware. Pt requesting to leave prior to completing treatment and wanting to sign out AMA. MD made aware

## 2023-12-15 NOTE — ED Notes (Addendum)
 RN called into pt room due to call bell. Pt sts  I want to talk to the Child psychotherapist. When I was here they tried to send me to one but that was not a good place. I just can't take care of myself anymore. I am in so much pain all the time. I can't walk or do anything. RN observed pt moving from side to side while in bed as well as raising her arms up and moving her legs. Pt cell phone was in pt hand and pt placed phone at the foot of the bed.

## 2023-12-15 NOTE — Progress Notes (Signed)
 HD Note:  Some information was entered later than the data was gathered due to patient care needs. The stated time with the data is accurate.  Received patient in stretched in ER unit.   Alert and oriented.   Informed consent signed and in chart.   Access used: right arm AVG Access issues: none  Patient did not tolerated treatment well.   TX duration:  1.5 hours.  Left ER AMA   Alert, without acute distress.  Total UF removed: 0.4L  Hand-off given to patient's nurse.   Tylin Force RN Kid[facility] Dialysis Unit.

## 2023-12-16 ENCOUNTER — Encounter: Payer: Self-pay | Admitting: Emergency Medicine

## 2023-12-16 ENCOUNTER — Emergency Department

## 2023-12-16 ENCOUNTER — Inpatient Hospital Stay
Admission: EM | Admit: 2023-12-16 | Discharge: 2023-12-31 | DRG: 545 | Disposition: A | Discharge status: Other Acute Inpatient Hospital | Attending: Internal Medicine | Admitting: Internal Medicine

## 2023-12-16 ENCOUNTER — Other Ambulatory Visit: Payer: Self-pay

## 2023-12-16 DIAGNOSIS — R4182 Altered mental status, unspecified: Secondary | ICD-10-CM | POA: Diagnosis present

## 2023-12-16 DIAGNOSIS — R627 Adult failure to thrive: Secondary | ICD-10-CM

## 2023-12-16 DIAGNOSIS — E1142 Type 2 diabetes mellitus with diabetic polyneuropathy: Secondary | ICD-10-CM | POA: Diagnosis present

## 2023-12-16 DIAGNOSIS — E785 Hyperlipidemia, unspecified: Secondary | ICD-10-CM | POA: Diagnosis present

## 2023-12-16 DIAGNOSIS — M329 Systemic lupus erythematosus, unspecified: Secondary | ICD-10-CM

## 2023-12-16 DIAGNOSIS — A419 Sepsis, unspecified organism: Secondary | ICD-10-CM | POA: Diagnosis present

## 2023-12-16 DIAGNOSIS — N186 End stage renal disease: Secondary | ICD-10-CM

## 2023-12-16 DIAGNOSIS — E039 Hypothyroidism, unspecified: Secondary | ICD-10-CM | POA: Diagnosis present

## 2023-12-16 DIAGNOSIS — G9341 Metabolic encephalopathy: Secondary | ICD-10-CM | POA: Diagnosis present

## 2023-12-16 DIAGNOSIS — A601 Herpesviral infection of perianal skin and rectum: Secondary | ICD-10-CM

## 2023-12-16 DIAGNOSIS — R5381 Other malaise: Principal | ICD-10-CM

## 2023-12-16 DIAGNOSIS — J189 Pneumonia, unspecified organism: Secondary | ICD-10-CM

## 2023-12-16 DIAGNOSIS — I7782 Antineutrophilic cytoplasmic antibody (ANCA) vasculitis: Secondary | ICD-10-CM

## 2023-12-16 DIAGNOSIS — G053 Encephalitis and encephalomyelitis in diseases classified elsewhere: Secondary | ICD-10-CM

## 2023-12-16 DIAGNOSIS — M3219 Other organ or system involvement in systemic lupus erythematosus: Secondary | ICD-10-CM

## 2023-12-16 DIAGNOSIS — D761 Hemophagocytic lymphohistiocytosis: Secondary | ICD-10-CM

## 2023-12-16 DIAGNOSIS — D696 Thrombocytopenia, unspecified: Secondary | ICD-10-CM | POA: Diagnosis present

## 2023-12-16 LAB — COMPREHENSIVE METABOLIC PANEL WITH GFR
ALT: 17 U/L (ref 0–44)
AST: 66 U/L — ABNORMAL HIGH (ref 15–41)
Albumin: 3 g/dL — ABNORMAL LOW (ref 3.5–5.0)
Alkaline Phosphatase: 63 U/L (ref 38–126)
Anion gap: 22 — ABNORMAL HIGH (ref 5–15)
BUN: 69 mg/dL — ABNORMAL HIGH (ref 6–20)
CO2: 20 mmol/L — ABNORMAL LOW (ref 22–32)
Calcium: 8.3 mg/dL — ABNORMAL LOW (ref 8.9–10.3)
Chloride: 94 mmol/L — ABNORMAL LOW (ref 98–111)
Creatinine, Ser: 8.09 mg/dL — ABNORMAL HIGH (ref 0.44–1.00)
GFR, Estimated: 5 mL/min — ABNORMAL LOW (ref >60)
Glucose, Bld: 153 mg/dL — ABNORMAL HIGH (ref 70–99)
Potassium: 4 mmol/L (ref 3.5–5.1)
Sodium: 136 mmol/L (ref 135–145)
Total Bilirubin: 1.1 mg/dL (ref 0.0–1.2)
Total Protein: 6.9 g/dL (ref 6.5–8.1)

## 2023-12-16 LAB — CBC
HCT: 27.3 % — ABNORMAL LOW (ref 36.0–46.0)
Hemoglobin: 9.8 g/dL — ABNORMAL LOW (ref 12.0–15.0)
MCH: 33.8 pg (ref 26.0–34.0)
MCHC: 35.9 g/dL (ref 30.0–36.0)
MCV: 94.1 fL (ref 80.0–100.0)
Platelets: 54 K/uL — ABNORMAL LOW (ref 150–400)
RBC: 2.9 MIL/uL — ABNORMAL LOW (ref 3.87–5.11)
RDW: 20.6 % — ABNORMAL HIGH (ref 11.5–15.5)
WBC: 4.1 K/uL (ref 4.0–10.5)
nRBC: 0 % (ref 0.0–0.2)

## 2023-12-16 LAB — HEPATITIS B SURFACE ANTIGEN: Hepatitis B Surface Ag: NONREACTIVE

## 2023-12-16 MED ORDER — HYDROXYZINE HCL 25 MG PO TABS
25.0000 mg | ORAL_TABLET | Freq: Three times a day (TID) | ORAL | Status: DC | PRN
  Administered 2023-12-16 – 2023-12-28 (×5): 25 mg via ORAL
  Filled 2023-12-16 (×5): qty 1

## 2023-12-16 MED ORDER — PANCRELIPASE (LIP-PROT-AMYL) 12000-38000 UNITS PO CPEP
12000.0000 [IU] | ORAL_CAPSULE | Freq: Three times a day (TID) | ORAL | Status: DC
  Administered 2023-12-16 – 2023-12-31 (×25): 12000 [IU] via ORAL
  Filled 2023-12-16 (×38): qty 1

## 2023-12-16 MED ORDER — PANTOPRAZOLE SODIUM 40 MG PO TBEC
40.0000 mg | DELAYED_RELEASE_TABLET | Freq: Every day | ORAL | Status: DC
  Administered 2023-12-16 – 2023-12-31 (×14): 40 mg via ORAL
  Filled 2023-12-16 (×14): qty 1

## 2023-12-16 MED ORDER — LEVOTHYROXINE SODIUM 125 MCG PO TABS
187.5000 ug | ORAL_TABLET | ORAL | Status: DC
  Administered 2023-12-18 – 2023-12-25 (×2): 187.5 ug via ORAL
  Filled 2023-12-16 (×2): qty 1.5

## 2023-12-16 MED ORDER — ZINC SULFATE 220 (50 ZN) MG PO CAPS
220.0000 mg | ORAL_CAPSULE | Freq: Every day | ORAL | Status: DC
  Administered 2023-12-16 – 2023-12-31 (×14): 220 mg via ORAL
  Filled 2023-12-16 (×14): qty 1

## 2023-12-16 MED ORDER — ALBUTEROL SULFATE (2.5 MG/3ML) 0.083% IN NEBU
2.5000 mg | INHALATION_SOLUTION | Freq: Four times a day (QID) | RESPIRATORY_TRACT | Status: DC | PRN
  Filled 2023-12-16: qty 3

## 2023-12-16 MED ORDER — GUAIFENESIN-DM 100-10 MG/5ML PO SYRP
5.0000 mL | ORAL_SOLUTION | ORAL | Status: DC | PRN
  Administered 2023-12-18 – 2023-12-23 (×3): 5 mL via ORAL
  Filled 2023-12-16 (×3): qty 10

## 2023-12-16 MED ORDER — MIDODRINE HCL 5 MG PO TABS: 2.5000 mg | ORAL_TABLET | ORAL | Status: DC

## 2023-12-16 MED ORDER — AZITHROMYCIN 500 MG PO TABS
500.0000 mg | ORAL_TABLET | Freq: Every day | ORAL | Status: AC
  Administered 2023-12-16 – 2023-12-17 (×2): 500 mg via ORAL
  Filled 2023-12-16 (×2): qty 1

## 2023-12-16 MED ORDER — PREDNISONE 20 MG PO TABS
40.0000 mg | ORAL_TABLET | Freq: Every day | ORAL | Status: AC
  Administered 2023-12-16 – 2023-12-17 (×2): 40 mg via ORAL
  Filled 2023-12-16 (×2): qty 2

## 2023-12-16 MED ORDER — RENA-VITE PO TABS
1.0000 | ORAL_TABLET | Freq: Every day | ORAL | Status: DC
  Administered 2023-12-16 – 2023-12-30 (×13): 1 via ORAL
  Filled 2023-12-16 (×15): qty 1

## 2023-12-16 MED ORDER — CALCIUM ACETATE (PHOS BINDER) 667 MG PO CAPS
1334.0000 mg | ORAL_CAPSULE | Freq: Three times a day (TID) | ORAL | Status: DC
  Administered 2023-12-16 – 2023-12-31 (×24): 1334 mg via ORAL
  Filled 2023-12-16 (×38): qty 2

## 2023-12-16 MED ORDER — ALBUTEROL SULFATE HFA 108 (90 BASE) MCG/ACT IN AERS: 2.0000 | INHALATION_SPRAY | Freq: Four times a day (QID) | RESPIRATORY_TRACT | Status: DC | PRN

## 2023-12-16 MED ORDER — FOLIC ACID 1 MG PO TABS
1.0000 mg | ORAL_TABLET | Freq: Every day | ORAL | Status: DC
  Administered 2023-12-16 – 2023-12-31 (×14): 1 mg via ORAL
  Filled 2023-12-16 (×14): qty 1

## 2023-12-16 MED ORDER — ACETAMINOPHEN 325 MG PO TABS
650.0000 mg | ORAL_TABLET | Freq: Four times a day (QID) | ORAL | Status: DC | PRN
  Administered 2023-12-17 – 2023-12-29 (×12): 650 mg via ORAL
  Filled 2023-12-16 (×13): qty 2

## 2023-12-16 MED ORDER — ATORVASTATIN CALCIUM 20 MG PO TABS
20.0000 mg | ORAL_TABLET | Freq: Every day | ORAL | Status: DC
  Administered 2023-12-16 – 2023-12-31 (×14): 20 mg via ORAL
  Filled 2023-12-16 (×14): qty 1

## 2023-12-16 MED ORDER — LEVOTHYROXINE SODIUM 50 MCG PO TABS
125.0000 ug | ORAL_TABLET | ORAL | Status: DC
  Administered 2023-12-17 – 2023-12-24 (×5): 125 ug via ORAL
  Filled 2023-12-16 (×6): qty 3

## 2023-12-16 MED ORDER — LORATADINE 10 MG PO TABS
10.0000 mg | ORAL_TABLET | Freq: Every day | ORAL | Status: DC
  Administered 2023-12-16 – 2023-12-31 (×13): 10 mg via ORAL
  Filled 2023-12-16 (×14): qty 1

## 2023-12-16 MED ORDER — TRAMADOL HCL 50 MG PO TABS
50.0000 mg | ORAL_TABLET | Freq: Three times a day (TID) | ORAL | Status: DC | PRN
  Administered 2023-12-16 – 2023-12-21 (×6): 50 mg via ORAL
  Filled 2023-12-16 (×7): qty 1

## 2023-12-16 MED ORDER — AMITRIPTYLINE HCL 25 MG PO TABS
25.0000 mg | ORAL_TABLET | Freq: Every day | ORAL | Status: DC
  Administered 2023-12-16 – 2023-12-23 (×8): 25 mg via ORAL
  Filled 2023-12-16 (×9): qty 1

## 2023-12-16 MED ORDER — TRAMADOL HCL 50 MG PO TABS: 50.0000 mg | ORAL_TABLET | Freq: Every evening | ORAL | Status: DC | PRN

## 2023-12-16 MED ORDER — CEFUROXIME AXETIL 500 MG PO TABS
500.0000 mg | ORAL_TABLET | ORAL | Status: AC
  Administered 2023-12-16 – 2023-12-19 (×2): 500 mg via ORAL
  Filled 2023-12-16 (×3): qty 1

## 2023-12-16 MED ORDER — LEVOTHYROXINE SODIUM 50 MCG PO TABS
125.0000 ug | ORAL_TABLET | Freq: Every day | ORAL | Status: DC
  Administered 2023-12-16: 125 ug via ORAL
  Filled 2023-12-16: qty 3

## 2023-12-16 MED ORDER — AMLODIPINE BESYLATE 5 MG PO TABS
5.0000 mg | ORAL_TABLET | Freq: Every day | ORAL | Status: DC
  Administered 2023-12-16 – 2023-12-24 (×7): 5 mg via ORAL
  Filled 2023-12-16 (×8): qty 1

## 2023-12-16 MED ORDER — FLUTICASONE PROPIONATE 50 MCG/ACT NA SUSP
2.0000 | Freq: Every day | NASAL | Status: DC
  Administered 2023-12-25 – 2023-12-31 (×5): 2 via NASAL
  Filled 2023-12-16 (×2): qty 16

## 2023-12-16 NOTE — ED Provider Notes (Signed)
 Received signout at 3:17 pm  Clinical Course as of 12/16/23 1517  Fri Dec 16, 2023  0725 Of note, I put in a consult order for nephrology and sent a secure chat message to Dr. Marcelino to make him aware of the patient and her dialysis needs.  I transferred the patient's care to Dr. Dorothyann in the ED. [CF]  [address]. Awaiting on PT/OT. ESRD on dialysis. Has outpatient dialysis spot for tomorrow.  [HD]    Clinical Course User Index [CF] Gordan Huxley, MD [HD] Nicholaus Rolland BRAVO, MD   Patient evaluated by social work but no plan yet for dispo.  If she is still here tomorrow she will need to touch base with the dialysis team.   Patient resting comfortably and I have ordered her home medications.  Dispo is pending TOC and she will be signed out to oncoming physician   Nicholaus Rolland BRAVO, MD 12/16/23 2232

## 2023-12-16 NOTE — ED Provider Notes (Addendum)
 [facility] Provider Note    Event Date/Time   First MD Initiated Contact with Patient 12/16/23 0240     (approximate)   History   Weakness   HPI Tonya Myers is a 58 y.o. female who returns to the emergency department for generalized weakness and inability to care for herself.  She was just recently hospitalized with multiple issues but most specifically pneumonia.  She was discharged 2 to 3 days ago.  She said that she thought she could take care of herself at home but she cannot.  She was not able to fill her prescriptions and has just been at home not doing anything.  She said that she had to get out her walker to get around and normally she does not use it.  She came in here for generalized weakness and not having any energy.  She has no specific weakness in her arms or her legs, just generally.  She came back to the ED yesterday morning and was evaluated both by the ED physician and apparently by the hospitalist service although no note was written.  She was taken for dialysis but then said that she felt too weak to get the dialysis and canceled it.  She was then discharged back home.  She came back in this evening for another evaluation because she does not feel better.     Physical Exam   Triage Vital Signs: ED Triage Vitals  Encounter Vitals Group     BP 12/16/23 0001 (!) 140/106     Girls Systolic BP Percentile --      Girls Diastolic BP Percentile --      Boys Systolic BP Percentile --      Boys Diastolic BP Percentile --      Pulse Rate 12/16/23 0001 84     Resp 12/16/23 0001 20     Temp 12/16/23 0001 97.9 F (36.6 C)     Temp Source 12/16/23 0001 Oral     SpO2 12/16/23 0001 95 %     Weight 12/16/23 0006 91.6 kg (202 lb)     Height 12/16/23 0006 1.829 m (6')     Head Circumference --      Peak Flow --      Pain Score 12/16/23 0006 10     Pain Loc --      Pain Education --      Exclude from Growth Chart --     Most recent  vital signs: Vitals:   12/16/23 0330 12/16/23 0400  BP: (!) 171/97   Pulse: 89   Resp: 20   Temp:    SpO2: 98% 96%    General: Awake, alert, does not seem to be in distress although she is somewhat anxious. CV:  Good peripheral perfusion.  Regular rate and rhythm, normal heart sounds. Resp:  Normal effort. Speaking easily and comfortably, no accessory muscle usage nor intercostal retractions.  Lungs are clear to auscultation bilaterally. Abd:  No distention.  Other:  The patient is moving all 4 extremities without any apparent difficulty.  She is able to ambulate but there seems to be at least some component of effort or perhaps simply deconditioning.  ED Results / Procedures / Treatments   Labs (all labs ordered are listed, but only abnormal results are displayed) Labs Reviewed  CBC - Abnormal; Notable for the following components:      Result Value   RBC 2.90 (*)    Hemoglobin 9.8 (*)  HCT 27.3 (*)    RDW 20.6 (*)    Platelets 54 (*)    All other components within normal limits  COMPREHENSIVE METABOLIC PANEL WITH GFR - Abnormal; Notable for the following components:   Chloride 94 (*)    CO2 20 (*)    Glucose, Bld 153 (*)    BUN 69 (*)    Creatinine, Ser 8.09 (*)    Calcium  8.3 (*)    Albumin  3.0 (*)    AST 66 (*)    GFR, Estimated 5 (*)    Anion gap 22 (*)    All other components within normal limits     EKG  ED ECG REPORT I, Darleene Dome, the attending physician, personally viewed and interpreted this ECG.  Date: 12/16/2023 EKG Time: 00: 11 Rate: 88 Rhythm: normal sinus rhythm QRS Axis: normal Intervals: Mildly prolonged QTc at 500 ms ST/T Wave abnormalities: normal Narrative Interpretation: no evidence of acute ischemia    RADIOLOGY I independently viewed and interpreted the patient's two-view chest x-ray from yesterday and I see no evidence of pneumonia nor pulmonary edema.   PROCEDURES:  Critical Care performed:  No  Procedures    IMPRESSION / MDM / ASSESSMENT AND PLAN / ED COURSE  I reviewed the triage vital signs and the nursing notes.                              Differential diagnosis includes, but is not limited to, deconditioning, malingering, persistent or worsening pneumonia, metabolic abnormality.  Patient's presentation is most consistent with exacerbation of chronic illness.  Labs/studies ordered: CBC, CMP  Interventions/Medications given:  Medications  traMADol  (ULTRAM ) tablet 50 mg (has no administration in time range)  azithromycin  (ZITHROMAX ) tablet 500 mg (has no administration in time range)  cefUROXime  (CEFTIN ) tablet 500 mg (has no administration in time range)  amLODipine  (NORVASC ) tablet 5 mg (has no administration in time range)  atorvastatin  (LIPITOR) tablet 20 mg (has no administration in time range)  midodrine  (PROAMATINE ) tablet 2.5 mg (has no administration in time range)  amitriptyline  (ELAVIL ) tablet 25 mg (has no administration in time range)  levothyroxine  (SYNTHROID ) tablet 125 mcg (has no administration in time range)  predniSONE  (DELTASONE ) tablet 40 mg (has no administration in time range)  calcium  acetate (PHOSLO ) capsule 1,334 mg (has no administration in time range)  pantoprazole  (PROTONIX ) EC tablet 40 mg (has no administration in time range)  folic acid  (FOLVITE ) tablet 1 mg (has no administration in time range)  multivitamin (RENA-VIT) tablet 1 tablet (has no administration in time range)  zinc  sulfate (50mg  elemental zinc ) capsule 220 mg (has no administration in time range)  loratadine  (CLARITIN ) tablet 10 mg (has no administration in time range)  fluticasone  (FLONASE ) 50 MCG/ACT nasal spray 2 spray (has no administration in time range)  acetaminophen  (TYLENOL ) tablet 650 mg (has no administration in time range)  hydrOXYzine  (ATARAX ) tablet 25 mg (has no administration in time range)  guaiFENesin -dextromethorphan  (ROBITUSSIN DM) 100-10 MG/5ML  syrup 5 mL (has no administration in time range)  lipase/protease/amylase (CREON ) capsule 12,000 Units (has no administration in time range)  albuterol  (PROVENTIL ) (2.5 MG/3ML) 0.083% nebulizer solution 2.5 mg (has no administration in time range)    (Note:  hospital course my include additional interventions and/or labs/studies not listed above.)   Patient's vital signs are notable for some hypertension, otherwise they are within normal limits.  After getting partial dialysis yesterday, her BUN  has come down to about 69 which is obviously still elevated but improved from before.  Her anion gap is 22 but again she is a dialysis patient.  Her CBC is stable with no leukocytosis and no anemia.  She remains thrombocytopenic.  The patient is upset and feels that she cannot care for herself at home.  I reviewed the medical records including the discharge summary written on 12/13/2023 by Dr. Leita Blanch of the hospitalist service.  The patient was supposed to get outpatient antibiotics but she claims that she has not been taking them.  However, Dr. Ernest, the ED physician that saw her on her most recent visit, spoke with family who states that they believe that she has been taking them, that she makes her own decisions, and that she does not have a legal guardian.  Also of note in the discharge summary: Originally it was recommended that the patient go to a rehab facility, but prior to discharge the patient refused and says she would rather go home.  The patient states that she thought she would be able to take care of herself and she did not want to go to the place that they offered her, but she has not been able to look after herself.  At this point, I cannot identify a specific emergent or acute medical reason to admit the patient to the hospitalist service.  I will consult PT and OT because the patient was just discharged earlier this week (less than 3 days ago), I am hopeful that the [facility] team will be  able to place her in a facility quickly.  I will also order a PT/OT evaluations in case they are required to be updated to facilitate placement.  I am placing the patient into TOC boarder status and ordering her medications as listed in the discharge summary.  Clinical Course as of 12/16/23 0725  Fri Dec 16, 2023  0725 Of note, I put in a consult order for nephrology and sent a secure chat message to Dr. Marcelino to make him aware of the patient and her dialysis needs.  I transferred the patient's care to Dr. Dorothyann in the ED. [CF]    Clinical Course User Index [CF] Gordan Huxley, MD      FINAL CLINICAL IMPRESSION(S) / ED DIAGNOSES   Final diagnoses:  Physical deconditioning  Community acquired pneumonia of left lower lobe of lung  Failure to thrive in adult     Rx / DC Orders   ED Discharge Orders     None        Note:  This document was prepared using Dragon voice recognition software and may include unintentional dictation errors.   Gordan Huxley, MD 12/16/23 9389    Gordan Huxley, MD 12/16/23 [id number]

## 2023-12-16 NOTE — [place] FL2 (Signed)
 [place]  MEDICAID FL2 LEVEL OF CARE FORM     IDENTIFICATION  Patient Name: Tonya Myers Birthdate: [birth year] Sex: female Admission Date (Current Location): 12/16/2023  [place] and [place] Number:  Chiropodist and Address:  [facility], [address] [place], [place] [address]      Provider Number: [id number]  Attending Physician Name and Address:  Nicholaus Rolland BRAVO, MD  Relative Name and Phone Number:  Reena Hedge [phone]    Current Level of Care: Hospital Recommended Level of Care: Skilled Nursing Facility Prior Approval Number:    Date Approved/Denied:   PASRR Number: [id number]  Discharge Plan: SNF    Current Diagnoses: Patient Active Problem List   Diagnosis Date Noted   History of meningitis 12/15/2023   Weakness 12/15/2023   Bilateral wrist pain 12/11/2023   CAP (community acquired pneumonia) 12/11/2023   Pneumonia of left lung due to infectious organism 12/10/2023   Orthostatic hypotension 12/10/2023   Diet-controlled diabetes mellitus (HCC) 12/10/2023   Failure to thrive in adult/frailty 08/13/2023   Left patella fracture 08/13/2023   Chest pain 07/30/2023   Near syncope 07/30/2023   ESRD (end stage renal disease) (HCC) 07/30/2023   Peripheral neuropathy 07/30/2023   Chronic diarrhea 07/25/2023   Dysarthria 03/28/2023   Uremia of renal origin 03/28/2023   Stroke (HCC) 03/28/2023   Sacral decubitus ulcer, stage IV (HCC) 12/23/2021   Protein-calorie malnutrition, moderate (HCC) 12/17/2021   History of herpes genitalis 12/14/2021   Sepsis (HCC) 12/12/2021   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   Closed fracture of sternum 12/12/2021   Chronic pancreatitis (HCC) 12/12/2021   Asthma 12/12/2021   Anemia in ESRD (end-stage renal disease) (HCC) 12/12/2021   UTI (urinary tract infection) 12/12/2021   Pressure injury of skin 12/11/2021   Elevated troponin 12/10/2021   Syncope 12/09/2021   Dyslipidemia 12/09/2021    Type 2 diabetes mellitus with peripheral neuropathy (HCC) 12/09/2021   GERD without esophagitis 12/09/2021   End-stage renal disease on hemodialysis (HCC) 12/09/2021   Thrombocytopenia (HCC) 12/11/2020   FUO (fever of unknown origin) 12/11/2020   Pancytopenia (HCC) 12/11/2020   Oral thrush 12/11/2020   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HCC) 12/10/2020   Right arm weakness 10/21/2020   Quadriplegia (HCC) 10/10/2020   Wheelchair dependence 10/10/2020   Transient neurologic deficit 12/15/21 10/02/2020   Left upper quadrant abdominal pain    Pain    Chronic pain of both knees    Hypokalemia    Urinary retention    Slow transit constipation    Meningitis    Labile blood glucose    Uncontrolled type 2 diabetes mellitus with hyperosmolar nonketotic hyperglycemia (HCC)    Transaminitis    Sleep disturbance    Bacterial spinal epidural abscess 06/27/2020   Obesity (BMI 30.0-34.9)    Hypomagnesemia    Hypernatremia    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HCC)    Epidural abscess    Pneumococcal meningitis    Hyperglycemic crisis in diabetes mellitus (HCC) 06/10/2020   Acute metabolic encephalopathy 06/10/2020   Severe sepsis (HCC)    Diabetic ketoacidosis without coma associated with type 2 diabetes mellitus (HCC)    AKI (acute kidney injury) (HCC)    Elevated LFTs    Acute encephalopathy 06/09/2020   Bacterial vaginosis 11/01/2016   Chronic right shoulder pain 03/23/2016   Numbness and tingling 03/23/2016   Uncontrolled type 2 diabetes mellitus with hyperglycemia, without long-term current use of insulin  (HCC) 10/27/2015   DDD (degenerative disc disease), cervical 07/29/2015  Cervical disc disorder with radiculopathy of cervical region 07/29/2015   Cervical facet syndrome 07/29/2015   DDD (degenerative disc disease), lumbar 07/29/2015   Facet syndrome, lumbar 07/29/2015   Bilateral occipital neuralgia 07/29/2015   Sacroiliac joint dysfunction 07/29/2015   DJD of shoulder 07/29/2015   Herpes simplex  vulvovaginitis 05/29/2015   Acute hemorrhoid 03/11/2015   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) 01/15/2015   Hypothyroidism 12/26/2014   Arthritis 12/26/2014   Carpal tunnel syndrome, bilateral 12/26/2014   Essential hypertension 12/26/2014   Hyperlipidemia, mixed 12/26/2014    Orientation RESPIRATION BLADDER Height & Weight     Self, Time, Situation, Place  Normal Continent Weight: 202 lb (91.6 kg) Height:  6' (182.9 cm)  BEHAVIORAL SYMPTOMS/MOOD NEUROLOGICAL BOWEL NUTRITION STATUS      Continent Diet  AMBULATORY STATUS COMMUNICATION OF NEEDS Skin   Limited Assist Verbally Normal                       Personal Care Assistance Level of Assistance  Bathing, Feeding, Dressing Bathing Assistance: Independent Feeding assistance: Independent Dressing Assistance: Independent     Functional Limitations Info  Sight, Hearing, Speech Sight Info: Adequate Hearing Info: Adequate Speech Info: Adequate    SPECIAL CARE FACTORS FREQUENCY  PT (By licensed PT), OT (By licensed OT)     PT Frequency: 5x a week OT Frequency: 3x a week            Contractures Contractures Info: Not present    Additional Factors Info  Code Status, Allergies   Allergies Info: Amoxapine;Latex;Ceftazidime;Iodinated Contrast Media;Penicillins           Current Medications (12/16/2023):  This is the current hospital active medication list Current Facility-Administered Medications  Medication Dose Route Frequency Provider Last Rate Last Admin   acetaminophen  (TYLENOL ) tablet 650 mg  650 mg Oral Q6H PRN Gordan Huxley, MD       albuterol  (PROVENTIL ) (2.5 MG/3ML) 0.083% nebulizer solution 2.5 mg  2.5 mg Nebulization Q6H PRN Dail Rankin RAMAN, RPH       amitriptyline  (ELAVIL ) tablet 25 mg  25 mg Oral QHS Gordan Huxley, MD       amLODipine  (NORVASC ) tablet 5 mg  5 mg Oral Daily Gordan Huxley, MD   5 mg at 12/16/23 0935   atorvastatin  (LIPITOR) tablet 20 mg  20 mg Oral Daily Forbach, Cory, MD    20 mg at 12/16/23 9063   azithromycin  (ZITHROMAX ) tablet 500 mg  500 mg Oral Daily Gordan Huxley, MD   500 mg at 12/16/23 0936   calcium  acetate (PHOSLO ) capsule 1,334 mg  1,334 mg Oral TID WC Gordan Huxley, MD   1,334 mg at 12/16/23 1225   cefUROXime  (CEFTIN ) tablet 500 mg  500 mg Oral Q M,W,F-HD Gordan Huxley, MD   500 mg at 12/16/23 1225   fluticasone  (FLONASE ) 50 MCG/ACT nasal spray 2 spray  2 spray Each Nare Daily Gordan Huxley, MD       folic acid  (FOLVITE ) tablet 1 mg  1 mg Oral Daily Forbach, Cory, MD   1 mg at 12/16/23 9062   guaiFENesin -dextromethorphan  (ROBITUSSIN DM) 100-10 MG/5ML syrup 5 mL  5 mL Oral Q4H PRN Gordan Huxley, MD       hydrOXYzine  (ATARAX ) tablet 25 mg  25 mg Oral TID PRN Gordan Huxley, MD       [START ON 12/17/2023] levothyroxine  (SYNTHROID ) tablet 125 mcg  125 mcg Oral Once per day on Monday Tuesday Wednesday Thursday Friday Saturday  Nada Adriana BIRCH, RPH       [START ON 12/18/2023] levothyroxine  (SYNTHROID ) tablet 187.5 mcg  187.5 mcg Oral Q Sun Grubb, Rodney D, [facility]       lipase/protease/amylase (CREON ) capsule 12,000 Units  12,000 Units Oral TID WC Gordan Huxley, MD   12,000 Units at 12/16/23 1225   loratadine  (CLARITIN ) tablet 10 mg  10 mg Oral Daily Forbach, Cory, MD   10 mg at 12/16/23 9063   midodrine  (PROAMATINE ) tablet 2.5 mg  2.5 mg Oral UD Forbach, Cory, MD       multivitamin (RENA-VIT) tablet 1 tablet  1 tablet Oral QHS Gordan Huxley, MD       pantoprazole  (PROTONIX ) EC tablet 40 mg  40 mg Oral Daily Forbach, Cory, MD   40 mg at 12/16/23 9063   predniSONE  (DELTASONE ) tablet 40 mg  40 mg Oral Q breakfast Gordan Huxley, MD   40 mg at 12/16/23 9063   traMADol  (ULTRAM ) tablet 50 mg  50 mg Oral Q8H PRN Paduchowski, Kevin, MD   50 mg at 12/16/23 9063   zinc  sulfate (50mg  elemental zinc ) capsule 220 mg  220 mg Oral Daily Forbach, Cory, MD   220 mg at 12/16/23 9063   Current Outpatient Medications  Medication Sig Dispense Refill   acetaminophen  (TYLENOL ) 325 MG  tablet Take 2 tablets (650 mg total) by mouth every 6 (six) hours as needed for mild pain (or Fever >/= 101).     albuterol  (VENTOLIN  HFA) 108 (90 Base) MCG/ACT inhaler Inhale 2 puffs into the lungs every 6 (six) hours as needed for wheezing or shortness of breath.     amitriptyline  (ELAVIL ) 25 MG tablet Take 1 tablet (25 mg total) by mouth at bedtime. 30 tablet 2   amLODipine  (NORVASC ) 5 MG tablet Take 1 tablet (5 mg total) by mouth daily. 30 tablet 11   atorvastatin  (LIPITOR) 20 MG tablet Take 20 mg by mouth daily.     Blood Glucose Monitoring Suppl DEVI 1 each by Does not apply route 3 (three) times daily. May dispense any manufacturer covered by patient's insurance. 1 each 0   calcium  acetate (PHOSLO ) 667 MG capsule Take 1,334 mg by mouth 3 (three) times daily with meals. (Patient not taking: Reported on 12/10/2023)     cefUROXime  (CEFTIN ) 500 MG tablet Take 1 tablet (500 mg total) by mouth every Monday, Wednesday, and Friday with hemodialysis for 2 doses. 2 tablet 0   cetirizine (ZYRTEC) 10 MG tablet Take 1 tablet by mouth daily.     fluticasone  (FLONASE ) 50 MCG/ACT nasal spray Place 2 sprays into both nostrils daily.     folic acid  (FOLVITE ) 1 MG tablet Take 1 tablet by mouth daily.     Glucose Blood (BLOOD GLUCOSE TEST STRIPS) STRP 1 each by Does not apply route 3 (three) times daily. Use as directed to check blood sugar. May dispense any manufacturer covered by patient's insurance and fits patient's device. 100 strip 0   guaiFENesin -dextromethorphan  (ROBITUSSIN DM) 100-10 MG/5ML syrup Take 5 mLs by mouth every 4 (four) hours as needed for cough. 118 mL 0   hydrOXYzine  (ATARAX ) 25 MG tablet Take 1 tablet (25 mg total) by mouth 3 (three) times daily as needed for itching. 30 tablet 0   Lancet Device MISC 1 each by Does not apply route 3 (three) times daily. May dispense any manufacturer covered by patient's insurance. 1 each 0   Lancets MISC 1 each by Does not apply route 3 (three) times daily.  Use as directed to check blood sugar. May dispense any manufacturer covered by patient's insurance and fits patient's device. 100 each 0   levothyroxine  (SYNTHROID ) 125 MCG tablet Take 125 mcg by mouth daily. Take 1 tablet daily except Sunday take 1 and 1/2 tablet (187.5 mg).     lipase/protease/amylase (CREON ) 12000-38000 units CPEP capsule Take 1 capsule by mouth 3 (three) times daily with meals.     midodrine  (PROAMATINE ) 2.5 MG tablet Take 2.5 mg by mouth as directed. Only on dialysis days. (Monday Wednesday and Friday)     multivitamin (RENA-VIT) TABS tablet Take 1 tablet by mouth at bedtime.  0   ondansetron  (ZOFRAN -ODT) 8 MG disintegrating tablet Take 1 tablet (8 mg total) by mouth every 8 (eight) hours as needed for nausea or vomiting. 30 tablet 0   pantoprazole  (PROTONIX ) 40 MG tablet Take 1 tablet (40 mg total) by mouth 2 (two) times daily for 28 days, THEN 1 tablet (40 mg total) daily. 30 tablet 1   predniSONE  (DELTASONE ) 20 MG tablet Take 2 tablets (40 mg total) by mouth daily with breakfast for 2 days. 4 tablet 0   traMADol  (ULTRAM ) 50 MG tablet Take 1 tablet (50 mg total) by mouth at bedtime. 10 tablet 0   valACYclovir  (VALTREX ) 500 MG tablet Take 500 mg by mouth as directed. Take 1 tablet daily on dialysis days.(Monday, Wednesday and Friday)     zinc  sulfate 220 (50 Zn) MG capsule Take 1 capsule (220 mg total) by mouth daily.       Discharge Medications: Please see discharge summary for a list of discharge medications.  Relevant Imaging Results:  Relevant Lab Results:   Additional Information SS#: [id number] DOB: [birth year]  Pt has dialysis treatment as well  Edsel DELENA Fischer, [name]

## 2023-12-16 NOTE — Evaluation (Signed)
 Physical Therapy Evaluation Patient Details Name: Tonya Myers MRN: [id number] DOB: 03-15-1966 Today's Date: 12/16/2023  History of Present Illness  P is a 58 y.o. female who returns to the ED for generalized weakness and inability to care for herself. She was just recently hospitalized with multiple issues but most specifically pneumonia. 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 Pt performs bed mobility with CGA and transfers and ambulation with min-mod A. Prior to hospitalization, pt was mod I for ADLs and used RW or SPC for ambulation depending on how she felt her balance was. She reports multiple falls in the past 6 months. Pt now demonstrates deficits with safety judgement and strength. Pt demonstrates buckling with BL lower extremities with static standing when not using the RW for UE support. She required mod verbal and tactile cuing for RW management. PT and SPT assisted pt with linen and brief change during session. Would benefit from skilled PT to address above deficits and promote optimal return to PLOF.          If plan is discharge home, recommend the following: A little help with walking and/or transfers;Assistance with cooking/housework;Assist for transportation;Help with stairs or ramp for entrance   Can travel by private vehicle   Yes    Equipment Recommendations None recommended by PT  Recommendations for Other  Services       Functional Status Assessment Patient has had a recent decline in their functional status and demonstrates the ability to make significant improvements in function in a reasonable and predictable amount of time.     Precautions / Restrictions Precautions Precautions: Fall Recall of Precautions/Restrictions: Intact Restrictions Weight Bearing Restrictions Per Provider Order: No      Mobility  Bed Mobility Overal bed mobility: Needs Assistance Bed Mobility: Supine to Sit, Sit to Supine     Supine to sit: Contact guard Sit to supine: Contact guard assist   General bed mobility comments: Increased time to sit at EOB.    Transfers Overall transfer level: Needs assistance Equipment used: Rolling walker (2 wheels) Transfers: Sit to/from Stand Sit to Stand: Min assist           General transfer comment: Verbal cues for hand placement. Pt able to stand from tall ED stretcher with min A.    Ambulation/Gait Ambulation/Gait assistance: Min assist, Mod assist Gait Distance (Feet): 15 Feet Assistive device: Rolling walker (2 wheels) Gait Pattern/deviations: Step-to pattern       General Gait Details: Very impulsive throughout ambulation. Multiple verbal and tactile cues throughout for RW management.  Stairs            Wheelchair Mobility     Tilt Bed    Modified Rankin (Stroke Patients Only)       Balance Overall balance assessment: Needs assistance Sitting-balance support: Feet supported Sitting balance-Leahy Scale: Fair Sitting balance - Comments: Able to maintain seated balance with intermittent UE support from bed.   Standing balance support: Reliant on assistive device for balance, Bilateral upper extremity supported, During functional activity Standing balance-Leahy Scale: Poor Standing  balance comment: Pt demonstrates knees buckling when static standing and not holding on to RW. Inc postural sway with external perturbations when  adjusting hospital gown and briefs.                             Pertinent Vitals/Pain Pain Assessment Pain Assessment: 0-10 Pain Score: 8  Pain Location: R wrist and BL feet Pain Descriptors / Indicators: Aching, Sore Pain Intervention(s): Limited activity within patient's tolerance    Home Living Family/patient expects to be discharged to:: Private residence Living Arrangements: Other relatives Available Help at Discharge: Family;Available PRN/intermittently;Neighbor Type of Home: House Home Access: Level entry Entrance Stairs-Rails: None   Alternate Level Stairs-Number of Steps: flight to 2nd floor bedroom Home Layout: Two level;Bed/bath upstairs Home Equipment: Grab bars - toilet;Cane - single point;Rolling Walker (2 wheels)      Prior Function Prior Level of Function : Independent/Modified Independent;Driving             Mobility Comments: Pt reports able to ambulate using RW or SPC. She uses the RW when she feels like her balance is more off. Reports multiple falls in the past 6 months       Extremity/Trunk Assessment   Upper Extremity Assessment Upper Extremity Assessment: Generalized weakness    Lower Extremity Assessment Lower Extremity Assessment: Generalized weakness    Cervical / Trunk Assessment Cervical / Trunk Assessment: Normal  Communication   Communication Communication: No apparent difficulties    Cognition Arousal: Lethargic Behavior During Therapy: WFL for tasks assessed/performed   PT - Cognitive impairments: No apparent impairments                       PT - Cognition Comments: Pt is agreeable to PT. She was lethargic at beginning of session, but became more alert towards the end. Following commands: Intact       Cueing Cueing Techniques: Verbal cues, Tactile cues     General Comments      Exercises Other Exercises Other Exercises: Brief and linen change.   Assessment/Plan    PT Assessment Patient  needs continued PT services  PT Problem List Decreased strength;Decreased activity tolerance;Decreased balance;Decreased coordination;Decreased mobility       PT Treatment Interventions DME instruction;Gait training;Stair training;Functional mobility training;Therapeutic activities;Patient/family education;Neuromuscular re-education;Balance training;Therapeutic exercise    PT Goals (Current goals can be found in the Care Plan section)  Acute Rehab PT Goals Patient Stated Goal: to get better PT Goal Formulation: With patient Time For Goal Achievement: 12/25/23 Potential to Achieve Goals: Good    Frequency Min 2X/week     Co-evaluation               AM-PAC PT 6 Clicks Mobility  Outcome Measure Help needed turning from your back to your side while in a flat bed without using bedrails?: None Help needed moving from lying on your back to sitting on the side of a flat bed without using bedrails?: A Little Help needed moving to and from a bed to a chair (including a wheelchair)?: A Little Help needed standing up from a chair using your arms (e.g., wheelchair or bedside chair)?: A Little Help needed to walk in hospital room?: A Little Help needed climbing 3-5 steps with a railing? : A Lot 6 Click Score: 18    End of Session   Activity Tolerance: Patient tolerated treatment well Patient left: in bed;with call bell/phone within reach;with bed  alarm set Nurse Communication: Mobility status PT Visit Diagnosis: Unsteadiness on feet (R26.81);Other abnormalities of gait and mobility (R26.89);History of falling (Z91.81);Muscle weakness (generalized) (M62.81);Difficulty in walking, not elsewhere classified (R26.2)    Time: 8597-8581 PT Time Calculation (min) (ACUTE ONLY): 16 min   Charges:                 Jonquil Stubbe, SPT   Art Levan 12/16/2023, 2:56 PM

## 2023-12-16 NOTE — ED Triage Notes (Signed)
 Pt has been sitting in the lobby since signing herself out AMA; pt c/o weakness and pain to feet and hands; st that she has neuropathy and is not feeling well and would like to check back in again to be seen

## 2023-12-16 NOTE — ED Notes (Signed)
 While intentional rounding pt advised she needed to be changed she had diarrhea on her. Changed pts brief and advised pt she does not have any fecal matter in her brief only urine.

## 2023-12-16 NOTE — ED Notes (Signed)
 Attempted lab draw x 2 without success,pt tolerated well; lab tech called for assistance

## 2023-12-16 NOTE — TOC CM/SW Note (Addendum)
..  Transition of Care [facility]) - Inpatient Brief Assessment   Patient Details  Name: Grenda Lora MRN: [id number] Date of Birth: 07-27-1965  [facility] of [facility]) [name] Contact:    Edsel DELENA Fischer, [name] Phone Number: 12/16/2023, 5:42 PM   Clinical Narrative:  SW spoke with pt guardainGLENWOOD Reena Hedge at [phone].  Reena expressed concerned regarding placement at [facility] and negative reviews.  Reena expressed that pt went to Rehab before at [facility] and had a great experience and would like to try that facility. Reena expressed that she is not familiar with rehab agencies in town and expressed that SW can referred pt to another Rehab. SW referred pt to [facility] and [facility].  SW reached out to  [facility] to get fax number.  SW faxed over FL2, evals and clinical notes. Reena also expressed conerns that pt is not handling needing dialysis well and could possibly benefit from some counceling. SW will update AVS with counseling agency in the area. Pt goes to [facility] [address] for dialysis treatment. Pt uses wheel chair for mobility  Transition of Care Asessment:

## 2023-12-16 NOTE — ED Notes (Signed)
 Fall risk bundle is currently in place.

## 2023-12-16 NOTE — ED Notes (Signed)
 Lab tech arrives to triage for blood draw

## 2023-12-16 NOTE — Evaluation (Addendum)
 Occupational Therapy Evaluation Patient Details Name: Tonya Myers MRN: [id number] DOB: 30-Dec-1965 Today's Date: 12/16/2023   History of Present Illness   P is a 58 y.o. female who returns to the ED for generalized weakness and inability to care for herself. She was just recently hospitalized with multiple issues but most specifically pneumonia. 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 PTA, pt reports living alone in a 2 level home with family assist PRN. She reports being MOD I with ADL/IADLs performance and SPC use for mobility. She does endorse multiple falls. Pt with increased pain/weakness and has been unable to care for herself at home. Pt presents with deficits in strength, pain management, balance and activity tolerance, affecting safe and optimal ADL completion. Pt currently requires Min A for bed mobility, Min/MOD A for STS from high ED stretcher and toilet in ED room using grab bars and increased cueing for safety and hand/feet placement. Pt with increased difficulty standing from surfaces with bil wrist/hand pain at 10/10. Nurse notified of request for pain meds. Pt was soiled, required full bed and clothing change. Mod A for LB dressing tasks. Pt fatigues easily and reports just being sleepy and in pain all over.  Would not recommend returning home with limited assist at this time. Pt would benefit from skilled OT services to address noted impairments and functional limitations to maximize safety and  independence while minimizing future risk of falls, injury, and readmission. Do anticipate the need for follow up OT services upon acute hospital DC.      If plan is discharge home, recommend the following:   A lot of help with bathing/dressing/bathroom;A lot of help with walking and/or transfers;Help with stairs or ramp for entrance;Assistance with cooking/housework     Functional Status Assessment   Patient has had a recent decline in their functional status and demonstrates the ability to make significant improvements in function in a reasonable and predictable amount of time.     Equipment Recommendations   Other (comment) (defer to next venue)     Recommendations for Other Services         Precautions/Restrictions   Precautions Precautions: Fall Recall of Precautions/Restrictions: Intact Restrictions Weight Bearing Restrictions Per Provider Order: No     Mobility Bed Mobility Overal bed mobility: Needs Assistance Bed Mobility: Supine to Sit, Sit to Supine     Supine to sit: Min assist Sit to supine: Contact guard assist   General bed mobility comments: increased time/effort and BLE assist    Transfers Overall transfer level: Needs assistance Equipment used: Rolling walker (2 wheels) Transfers: Sit to/from Stand Sit to Stand: Min assist, Mod assist           General transfer comment: Min/Mod A to stand from high ED stretcher and toilet in room with cues for hand placement and safety; pt impuslive and prefers to do things her way      Balance Overall balance assessment: Needs assistance Sitting-balance support: Feet supported Sitting balance-Leahy Scale: Fair     Standing balance support: Reliant on assistive device for balance, Bilateral upper extremity supported Standing balance-Leahy Scale: Fair  Standing balance comment: CGA and RW for ambulation                           ADL either performed or assessed with clinical  judgement   ADL Overall ADL's : Needs assistance/impaired                     Lower Body Dressing: Moderate assistance;Sit to/from stand;Sitting/lateral leans Lower Body Dressing Details (indicate cue type and reason): doff/donn brief Toilet Transfer: Rolling walker (2 wheels);Regular Toilet;Grab bars;Moderate assistance   Toileting- Clothing Manipulation and Hygiene: Contact guard assist;Sitting/lateral lean;Minimal assistance Toileting - Clothing Manipulation Details (indicate cue type and reason): anterior hygiene, clothing management assist     Functional mobility during ADLs: Minimal assistance;Contact guard assist;Rolling walker (2 wheels)       Vision         Perception         Praxis         Pertinent Vitals/Pain Pain Assessment Pain Assessment: 0-10 Pain Score: 10-Worst pain ever Pain Location: bil wrists and generalized pain all over Pain Descriptors / Indicators: Aching, Sore Pain Intervention(s): Limited activity within patient's tolerance, Monitored during session, Patient requesting pain meds-RN notified     Extremity/Trunk Assessment Upper Extremity Assessment Upper Extremity Assessment: Generalized weakness   Lower Extremity Assessment Lower Extremity Assessment: Generalized weakness       Communication Communication Communication: No apparent difficulties   Cognition Arousal: Alert Behavior During Therapy: WFL for tasks assessed/performed Cognition: No apparent impairments                               Following commands: Intact       Cueing  General Comments      increased pain during session with elevated BP-nurse aware   Exercises Other Exercises Other Exercises: Edu on role of OT in acute setting and recommendation for rehab d/t limited family support and pt's increased weakness and pain.   Shoulder Instructions      Home Living Family/patient expects to be discharged to:: Private residence Living  Arrangements: Other relatives Available Help at Discharge: Family;Available PRN/intermittently;Neighbor Type of Home: House Home Access: Stairs to enter [employer] of Steps: 16 Entrance Stairs-Rails: None Home Layout: Two level;Bed/bath upstairs Alternate Level Stairs-Number of Steps: flight to 2nd floor bedroom Alternate Level Stairs-Rails: Left Bathroom Shower/Tub: Producer, television/film/video: Handicapped height     Home Equipment: Grab bars - tub/shower;Cane - single point;Rollator (4 wheels)   Additional Comments: reports having WC at one time, but is unsure where it is located      Prior Functioning/Environment Prior Level of Function : Independent/Modified Independent;Driving             Mobility Comments: Mod indep with SPC for household and limited community mobilization (should have been using RW per patient); drives self to/from dialysis.  Does endorse at least 4 falls in previous month. ADLs Comments: drives herself to HD    OT Problem List: Decreased strength;Decreased activity tolerance;Impaired balance (sitting and/or standing);Pain   OT Treatment/Interventions: Self-care/ADL training;Therapeutic exercise;Therapeutic activities;Energy conservation;DME and/or AE instruction;Patient/family education;Balance training      OT Goals(Current goals can be found in the care plan section)   Acute Rehab OT Goals Patient Stated Goal: improve strength and go to rehab OT Goal Formulation: With patient Time For Goal Achievement: 12/30/23 Potential to Achieve Goals: Good  ADL Goals Pt Will Perform Lower Body Bathing: with supervision;sitting/lateral leans;sit to/from stand Pt Will Perform Lower Body Dressing: with supervision;sitting/lateral leans;sit to/from stand Pt Will Transfer to Toilet: with supervision;ambulating;regular height toilet   OT Frequency:  Min 2X/week    Co-evaluation              AM-PAC OT 6 Clicks Daily Activity      Outcome Measure Help from another person eating meals?: None Help from another person taking care of personal grooming?: A Little Help from another person toileting, which includes using toliet, bedpan, or urinal?: A Lot Help from another person bathing (including washing, rinsing, drying)?: A Lot Help from another person to put on and taking off regular upper body clothing?: A Little Help from another person to put on and taking off regular lower body clothing?: A Lot 6 Click Score: 16   End of Session Equipment Utilized During Treatment: Rolling walker (2 wheels);Gait belt Nurse Communication: Mobility status  Activity Tolerance: Patient tolerated treatment well Patient left: in bed;with call bell/phone within reach  OT Visit Diagnosis: Other abnormalities of gait and mobility (R26.89);Muscle weakness (generalized) (M62.81);Repeated falls (R29.6)                Time: 9099-9068 OT Time Calculation (min): 31 min Charges:  OT General Charges $OT Visit: 1 Visit OT Evaluation $OT Eval Moderate Complexity: 1 Mod OT Treatments $Self Care/Home Management : 8-22 mins Kahleah Crass, OTR/L  12/16/23, 10:00 AM  Chanetta Moosman E Christain Niznik 12/16/2023, 9:57 AM

## 2023-12-17 LAB — RENAL FUNCTION PANEL
Albumin: 2.6 g/dL — ABNORMAL LOW (ref 3.5–5.0)
Anion gap: 16 — ABNORMAL HIGH (ref 5–15)
BUN: 91 mg/dL — ABNORMAL HIGH (ref 6–20)
CO2: 24 mmol/L (ref 22–32)
Calcium: 8.1 mg/dL — ABNORMAL LOW (ref 8.9–10.3)
Chloride: 94 mmol/L — ABNORMAL LOW (ref 98–111)
Creatinine, Ser: 10.57 mg/dL — ABNORMAL HIGH (ref 0.44–1.00)
GFR, Estimated: 4 mL/min — ABNORMAL LOW (ref >60)
Glucose, Bld: 310 mg/dL — ABNORMAL HIGH (ref 70–99)
Phosphorus: 7.1 mg/dL — ABNORMAL HIGH (ref 2.5–4.6)
Potassium: 5 mmol/L (ref 3.5–5.1)
Sodium: 134 mmol/L — ABNORMAL LOW (ref 135–145)

## 2023-12-17 LAB — CBC
HCT: 34.6 % — ABNORMAL LOW (ref 36.0–46.0)
Hemoglobin: 11.6 g/dL — ABNORMAL LOW (ref 12.0–15.0)
MCH: 32.1 pg (ref 26.0–34.0)
MCHC: 33.5 g/dL (ref 30.0–36.0)
MCV: 95.8 fL (ref 80.0–100.0)
Platelets: 49 K/uL — ABNORMAL LOW (ref 150–400)
RBC: 3.61 MIL/uL — ABNORMAL LOW (ref 3.87–5.11)
RDW: 20.6 % — ABNORMAL HIGH (ref 11.5–15.5)
WBC: 4.9 K/uL (ref 4.0–10.5)
nRBC: 0 % (ref 0.0–0.2)

## 2023-12-17 LAB — HEPATITIS B SURFACE ANTIBODY, QUANTITATIVE: Hep B S AB Quant (Post): <3.5 m[IU]/mL — ABNORMAL LOW

## 2023-12-17 MED ORDER — MIDODRINE HCL 5 MG PO TABS
2.5000 mg | ORAL_TABLET | ORAL | Status: DC
  Administered 2023-12-19 – 2023-12-26 (×4): 2.5 mg via ORAL
  Filled 2023-12-17 (×4): qty 1

## 2023-12-17 NOTE — TOC CM/SW Note (Addendum)
..  Transition of Care [facility]) - Inpatient Brief Assessment   Patient Details  Name: Tonya Myers MRN: [id number] Date of Birth: 13-Apr-1966  Transition [facility] [name] Contact:    Edsel DELENA Fischer, [name] Phone Number: 12/17/2023, 12:20 PM   Clinical Narrative:  SW went to visit with pt to discuss placement.  Pt is currently at HD.  TOC will follow up with pt at another time SW added mental health contacts and [employer] mental health contacts and nursing line to AVS Transition of Care Asessment:

## 2023-12-17 NOTE — ED Notes (Signed)
 Report given to dialysis.

## 2023-12-17 NOTE — Progress Notes (Signed)
 [facility]  ROUNDING NOTE   Subjective:  Patient seen and evaluated while on dialysis. No acute complaints. Patient hopes to discharge after dialysis.  Hemodialysis dialysis treatment flowsheet  Blood flow rate (mL/min): 350 Arterial pressures (mmHg): -176.96 Venous pressures (mmHg): 228.27 TMP (mmHg): 8.08 Ultrafiltration rate (mL/min): 829 Dialysate (mL/min): 299  Objective:  Vital signs in last 24 hours:  Temp:  [98.2 F (36.8 C)-98.6 F (37 C)] 98.6 F (37 C) (08/23 1115) Pulse Rate:  [63-79] 63 (08/23 1155) Resp:  [16-19] 19 (08/23 1230) BP: (131-162)/(81-87) 132/82 (08/23 1300) SpO2:  [98 %-100 %] 98 % (08/23 1300)  Weight change:  Filed Weights   12/16/23 0006  Weight: 91.6 kg    Intake/Output: No intake/output data recorded.   Intake/Output this shift:  No intake/output data recorded.  Physical Exam: General: NAD  Head: Normocephalic  Eyes: Anicteric  Neck: Supple  Lungs:  Clear to auscultation  Heart: Regular rate   Abdomen:  Soft, nontender  Extremities:  trace peripheral edema.  Neurologic: Alert, awake  Skin: No lesions  Access: Rt AVF    Basic Metabolic Panel: Recent Labs  Lab 12/12/23 0554 12/13/23 0338 12/15/23 0957 12/16/23 0123 12/17/23 1031  NA 138 135 137 136 134*  K 4.8 4.8 4.2 4.0 5.0  CL 98 96* 96* 94* 94*  CO2 23 27 21* 20* 24  GLUCOSE 177* 260* 118* 153* 310*  BUN 69* 59* 100* 69* 91*  CREATININE 9.93* 6.91* 10.61* 8.09* 10.57*  CALCIUM  8.0* 8.2* 8.2* 8.3* 8.1*  PHOS  --   --   --   --  7.1*    Liver Function Tests: Recent Labs  Lab 12/10/23 1404 12/15/23 1130 12/16/23 0123 12/17/23 1031  AST 41 51* 66*  --   ALT 8 15 17   --   ALKPHOS 60 63 63  --   BILITOT 0.9 1.1 1.1  --   PROT 7.3 6.5 6.9  --   ALBUMIN  3.1* 2.9* 3.0* 2.6*   Recent Labs  Lab 12/15/23 1130  LIPASE 85*   No results for input(s): AMMONIA in the last 168 hours.  CBC: Recent Labs  Lab 12/10/23 1404 12/11/23 0424  12/12/23 0554 12/13/23 0338 12/15/23 0957 12/16/23 0123 12/17/23 1031  WBC 5.7   < > 4.6 4.0 4.6 4.1 4.9  NEUTROABS 4.8  --   --   --   --   --   --   HGB 9.4*   < > 9.0* 8.5* 9.5* 9.8* 11.6*  HCT 25.7*   < > 24.6* 23.6* 27.5* 27.3* 34.6*  MCV 98.8   < > 98.8 95.9 95.5 94.1 95.8  PLT 78*   < > 82* 108* 53* 54* 49*   < > = values in this interval not displayed.    Cardiac Enzymes: No results for input(s): CKTOTAL, CKMB, CKMBINDEX, TROPONINI in the last 168 hours.  BNP: Invalid input(s): POCBNP  CBG: Recent Labs  Lab 12/12/23 1143 12/12/23 1632 12/12/23 2053 12/13/23 0726 12/13/23 1138  GLUCAP 152* 161* 270* 217* 184*    Microbiology: Results for orders placed or performed during the hospital encounter of 12/10/23  SARS Coronavirus 2 by RT PCR (hospital order, performed in [facility] hospital lab) *cepheid single result test* Anterior Nasal Swab     Status: None   Collection Time: 12/11/23 10:00 AM   Specimen: Anterior Nasal Swab  Result Value Ref Range Status   SARS Coronavirus 2 by RT PCR NEGATIVE NEGATIVE Final  Comment: (NOTE) SARS-CoV-2 target nucleic acids are NOT DETECTED.  The SARS-CoV-2 RNA is generally detectable in upper and lower respiratory specimens during the acute phase of infection. The lowest concentration of SARS-CoV-2 viral copies this assay can detect is 250 copies / mL. A negative result does not preclude SARS-CoV-2 infection and should not be used as the sole basis for treatment or other patient management decisions.  A negative result may occur with improper specimen collection / handling, submission of specimen other than nasopharyngeal swab, presence of viral mutation(s) within the areas targeted by this assay, and inadequate number of viral copies (<250 copies / mL). A negative result must be combined with clinical observations, patient history, and epidemiological information.  Fact Sheet for Patients:    RoadLapTop.co.za  Fact Sheet for Healthcare Providers: http://kim-miller.com/  This test is not yet approved or  cleared by the [place]  FDA and has been authorized for detection and/or diagnosis of SARS-CoV-2 by FDA under an Emergency Use Authorization (EUA).  This EUA will remain in effect (meaning this test can be used) for the duration of the COVID-19 declaration under Section 564(b)(1) of the Act, 21 U.S.C. section 360bbb-3(b)(1), unless the authorization is terminated or revoked sooner.  Performed at [facility], [address] [place], [place] [address]   Respiratory (~20 pathogens) panel by PCR     Status: None   Collection Time: 12/11/23 10:00 AM   Specimen: Nasopharyngeal Swab; Respiratory  Result Value Ref Range Status   Adenovirus NOT DETECTED NOT DETECTED Final   Coronavirus 229E NOT DETECTED NOT DETECTED Final    Comment: (NOTE) The Coronavirus on the Respiratory Panel, DOES NOT test for the novel  Coronavirus (2019 nCoV)    Coronavirus HKU1 NOT DETECTED NOT DETECTED Final   Coronavirus NL63 NOT DETECTED NOT DETECTED Final   Coronavirus OC43 NOT DETECTED NOT DETECTED Final   Metapneumovirus NOT DETECTED NOT DETECTED Final   Rhinovirus / Enterovirus NOT DETECTED NOT DETECTED Final   Influenza A NOT DETECTED NOT DETECTED Final   Influenza B NOT DETECTED NOT DETECTED Final   Parainfluenza Virus 1 NOT DETECTED NOT DETECTED Final   Parainfluenza Virus 2 NOT DETECTED NOT DETECTED Final   Parainfluenza Virus 3 NOT DETECTED NOT DETECTED Final   Parainfluenza Virus 4 NOT DETECTED NOT DETECTED Final   Respiratory Syncytial Virus NOT DETECTED NOT DETECTED Final   Bordetella pertussis NOT DETECTED NOT DETECTED Final   Bordetella Parapertussis NOT DETECTED NOT DETECTED Final   Chlamydophila pneumoniae NOT DETECTED NOT DETECTED Final   Mycoplasma pneumoniae NOT DETECTED NOT DETECTED Final    Comment: Performed at  [facility], [address], [place], [place] [address]    Coagulation Studies: No results for input(s): LABPROT, INR in the last 72 hours.  Urinalysis: No results for input(s): COLORURINE, LABSPEC, PHURINE, GLUCOSEU, HGBUR, BILIRUBINUR, KETONESUR, PROTEINUR, UROBILINOGEN, NITRITE, LEUKOCYTESUR in the last 72 hours.  Invalid input(s): APPERANCEUR    Imaging: No results found.   Medications:     amitriptyline   25 mg Oral QHS   amLODipine   5 mg Oral Daily   atorvastatin   20 mg Oral Daily   calcium  acetate  1,334 mg Oral TID WC   cefUROXime   500 mg Oral Q M,W,F-HD   fluticasone   2 spray Each Nare Daily   folic acid   1 mg Oral Daily   levothyroxine   125 mcg Oral Once per day on Monday Tuesday Wednesday Thursday Friday Saturday   [START ON 12/18/2023] levothyroxine   187.5 mcg Oral Q Sun   lipase/protease/amylase  12,000 Units Oral TID WC   loratadine   10 mg Oral Daily   [START ON 12/19/2023] midodrine   2.5 mg Oral Q M,W,F   multivitamin  1 tablet Oral QHS   pantoprazole   40 mg Oral Daily   zinc  sulfate (50mg  elemental zinc )  220 mg Oral Daily   acetaminophen , albuterol , guaiFENesin -dextromethorphan , hydrOXYzine , traMADol   Assessment/ Plan:  Ms. Catricia Scheerer is a 58 y.o.  female past medical history of end-stage renal disease on hemodialysis Monday Wednesday Friday schedule, ANCA related glomerulonephritis, diet-controlled diabetes, orthostatic hypotension and thrombocytopenia and CLL now admitted with complaints on generalized weakness and inability to care for herself. She is now being treated with antibiotics for pneumonia.   [facility] [place]/MWF/Rt AVF   ESRD on hemodialysis: last dialysis received Thursday. Missed treatment because she left hospital and returned. Having HD today in place of missed treatment.    ANEMIA with chronic kidney disease: Hgb 11.6, acceptable   Secondary Hyperparathyroidism: with outpatient labs: PTH  568, phosphorus 5.1, calcium  9.0 on 11/28/23  Recent Labs       Lab Results  Component Value Date    CALCIUM  8.1 (L) 12/17/2023    PHOS 7.1 (H) 12/17/2023      Currently prescribed calcitriol and calcium  acetate outpatient. Calcium  acceptable.     4. Hypertension with chronic kidney disease. Home regimen includes midodrine , currently held. Receiving hydrochlorothiazide  during this admission. Blood pressure stable   5. 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 Most recent hemoglobin A1c is 5.6 on 07/29/23.      6. CAP, chest xray shows left lower lobe bronchopneumonia. Primary team has ordered Rocephin  and Azithromycin . Elevated D-dimer, VQ scan negative for PE     LOS: 0 Tonya Myers SHAUNNA Dines 8/23/20251:31 PM

## 2023-12-17 NOTE — Progress Notes (Signed)
  Received patient in bed to unit.   Informed consent signed and TO BE SCANNED INTO CHART.   TX duration 3.26 Came off 3 min ealry due to feeling bad and hypotensive eposide     Transported by  Hand-off given to patient's nurse. Charge nurse Ed   Access used graft Access issues: none   Total UF removed: 1800 Medication(s) given: none Post HD VS: WNL Post HD weight: unable to obtain     Hunter Hacking LPN Kidney Dialysis Unit

## 2023-12-17 NOTE — Discharge Instructions (Signed)
 Counseling/ Mental Health Contacts  24/7 crisis support If you have thoughts of hurting yourself or others, make your first call to 911. The 988 Suicide & Crisis Lifeline is also available 24/7 by dialing 988.  Text HOME to [phone] to reach a crisis counselor   Why therapy is for everyone  If you're curious about therapy, here are four reasons seeing a mental health counselor may help improve everyone's well-being and happiness.  Therapy with a licensed health care professional can be a powerful tool for depression, anxiety and grief. But did you know there are many other unexpected ways it may boost your well-being, too? Here's how therapy may help you live a happier, healthier and more balanced life.   1. Ease physical pain  Injury, illness, aging and stress can all cause aches and pains. And that can sometimes bring us  down. But it doesn't have to. A therapist can give you tips to feel less overwhelmed and handle pain better and some of its underlying causes. One type of therapy is called cognitive-behavioral therapy (CBT). CBT uses strategies to help change how you think about pain so you feel it less.*   In one study, researchers reviewed the results of 24 studies on using CBT to ease the negative feelings that came with physical pain. They found that CBT is good at easing the symptoms of depression and anxiety that can make us  feel down.* 2. Improve relationships Therapy can help you find ways to spot and feel more in control of emotions like anger and anxiety. When this happens, you can talk about them more skillfully with family and friends. The outcome: you may be able to connect more fully with the people in your life and feel less alone.   Therapy may also help you spot unhealthy relationships or behavioral patterns and then change them. It can be a place where you discover your needs and wants. And it can show you how to communicate them. It may also be a way to learn how to be a more  supportive friend or partner.*  ???   Check your coverage?  3. Manage health issues  Did you know depression and anxiety are common in people with chronic conditions like diabetes, heart disease and chronic obstructive pulmonary disease (COPD)? Poor mental health can also affect how you manage your condition. And it can lead to the disease getting worse.* Talking to a therapist can teach you skills for taking control of your health, such as how to:   Stay on track with treatment   Stay away from unhealthy habits like smoking or alcohol   Ask for help when you need it  4. Reach your goals ?  Maybe you have a goal to eat healthier, exercise four days a week or spend more time with friends. If you need help reaching a goal, a therapist can give you tips for breaking old habits that are holding you back.   They can also help you create a plan for going after your goals and share tips for staying motivated. Your therapist can work with you on a goal-setting plan. The secret to success in achieving change is often to take small steps toward your bigger goal. During the process, a therapist can help you break down how to get what you want, and understand what's really important to you, and why.*   ??   Need to find an in-network therapist?? Visit [email or web address] or call Member Services at?[phone] (TTY: 711) between 8 AM  and 8 PM, 7 days a week.   Get support when times are tough?  While suicide rates have lowered globally, they unfortunately remain high among older adults.* And those rates tend to spike in the spring.* ?We want to make sure you know that your [employer] plan covers certain mental health services.   During therapy sessions you can work through some of the above challenges. Or you can talk to your counselor about any symptoms of depression, anger, sadness, hopelessness or grief. If you're having thoughts of suicide or hurting yourself, you should call the federal 988  Suicide and Crisis Lifeline, 24 hours, 7 days a week, for immediate help.?   Get your health questions answered A registered nurse is just a phone call away. Call our 24-Hour Nurse Line at?[phone] (TTY: 711)?to speak with a nurse, 24 hours a day, 7 days a week. If it's an emergency, dial  911.

## 2023-12-18 NOTE — ED Notes (Signed)
 Went to check on pt/reassess pain. Pt is sleeping. Pt refused both Creon  and Phoslo  due at 12pm. Family has left.

## 2023-12-18 NOTE — ED Notes (Signed)
 This RN and Sage, NT removed prior linens from pts bed. Pt noted to be lying in soiled brief. Previous brief removed. Pt cleaned with bath wipes. New linens, chux pad, and brief placed on pt. New gown also placed on pt. Pt given socks and warm blanket. Pt noted to have productive cough and is producing thick phlegm. Pt given PRN Robitussin. Pt reports that her throat continues to be sore. This RN will make ED provider aware. Pt continues to lie in bed and play on phone. Denies any further complaints.

## 2023-12-18 NOTE — ED Notes (Signed)
 Pt asleep with lights off. Unlabored respirations.

## 2023-12-18 NOTE — ED Notes (Signed)
 Pt is awake now. Pt calling on call bell because wants MD to look in her throat. States throat feels sore. Pt also complains of HA. EDP informed.

## 2023-12-18 NOTE — ED Notes (Signed)
 Family at bedside is complaining that ED secretary was rude when answering call bell. Pt was calling for tylenol .

## 2023-12-18 NOTE — ED Provider Notes (Signed)
 Emergency Medicine Observation Re-evaluation Note   BP 114/67 (BP Location: Right Arm)   Pulse 79   Temp 98.7 F (37.1 C)   Resp 16   Ht 6' (1.829 m)   Wt 91.6 kg   SpO2 97%   BMI 27.40 kg/m    ED Course / MDM   No reported events during my shift at the time of this note.   Pt is awaiting dispo from consultants   Ginnie Shams MD    Shams Ginnie, MD 12/18/23 [phone]

## 2023-12-18 NOTE — ED Notes (Signed)
 AM meds were given. Pt had been asleep when this RN had rounded on pt this AM. Then this RN was in another room for awhile because of a new sick pt that this RN received. This RN was finally able to get into this room to see this pt, who then communicated with this RN as if she were annoyed or irritated that RN had not come in earlier to give Synthroid . Also, pt refused Phoslo . VS obtained and pt is eating breakfast.

## 2023-12-19 LAB — CBC
HCT: 28 % — ABNORMAL LOW (ref 36.0–46.0)
Hemoglobin: 9.4 g/dL — ABNORMAL LOW (ref 12.0–15.0)
MCH: 31.9 pg (ref 26.0–34.0)
MCHC: 33.6 g/dL (ref 30.0–36.0)
MCV: 94.9 fL (ref 80.0–100.0)
Platelets: 68 K/uL — ABNORMAL LOW (ref 150–400)
RBC: 2.95 MIL/uL — ABNORMAL LOW (ref 3.87–5.11)
RDW: 20 % — ABNORMAL HIGH (ref 11.5–15.5)
WBC: 6.1 K/uL (ref 4.0–10.5)
nRBC: 0 % (ref 0.0–0.2)

## 2023-12-19 LAB — RENAL FUNCTION PANEL
Albumin: 2.9 g/dL — ABNORMAL LOW (ref 3.5–5.0)
Anion gap: 17 — ABNORMAL HIGH (ref 5–15)
BUN: 73 mg/dL — ABNORMAL HIGH (ref 6–20)
CO2: 25 mmol/L (ref 22–32)
Calcium: 8.5 mg/dL — ABNORMAL LOW (ref 8.9–10.3)
Chloride: 93 mmol/L — ABNORMAL LOW (ref 98–111)
Creatinine, Ser: 8.46 mg/dL — ABNORMAL HIGH (ref 0.44–1.00)
GFR, Estimated: 5 mL/min — ABNORMAL LOW (ref >60)
Glucose, Bld: 203 mg/dL — ABNORMAL HIGH (ref 70–99)
Phosphorus: 4.2 mg/dL (ref 2.5–4.6)
Potassium: 5.4 mmol/L — ABNORMAL HIGH (ref 3.5–5.1)
Sodium: 135 mmol/L (ref 135–145)

## 2023-12-19 LAB — RESP PANEL BY RT-PCR (RSV, FLU A&B, COVID)  RVPGX2
Influenza A by PCR: NEGATIVE
Influenza B by PCR: NEGATIVE
Resp Syncytial Virus by PCR: NEGATIVE
SARS Coronavirus 2 by RT PCR: NEGATIVE

## 2023-12-19 LAB — GROUP A STREP BY PCR: Group A Strep by PCR: NOT DETECTED

## 2023-12-19 NOTE — ED Notes (Addendum)
 This RN at pts bedside. Pt observed lying in bed with eyes closed. Pt woke up upon this RN entrance. This RN asked pt if she needed anything and pt states No I don't.

## 2023-12-19 NOTE — TOC Progression Note (Addendum)
 Transition of Care [facility]) - Progression Note    Patient Details  Name: Tonya Myers MRN: [id number] Date of Birth: 1965-10-25  Transition of Care [facility]) [name] Contact  Marinda Cooks, RN Phone Number: 12/19/2023, 9:34 AM  Clinical Narrative:    This CM attempted to reach admission liaison at [facility] to confirm bed referral was received and was sent to VM. This CM left detail message including direct contact info for liaison to call back. This CM  attempted to talk with pt alerted pt is off unit at dialysis. TOC will cont to follow dc planning/ care coordination and update as applicable.       09:45a- This CM called pt's insurance plan to inquire about embedded services for additional resources available to pt and was informed pt has access to  services listed below . This CM also was informed pt's [employer] plan will initiate a embedded Case Manager to start coordinating with pt as well .   Community education officer Embedded services within [employer] Number:[phone]  Transportations Services : [phone]  Over The Counter Medicine Benefits: [phone]     15:48p -This CM spoke with admission liaison at [facility] in [facility] regarding referral sent and was updated initial referral/ clinical notes were not received for rehab bed . This CM resent clinicals for review .   16:02p-This CM arrived bedside introduced role and discussed dc plan with pt. Pt A&O x4 and confirmed she makes her own decisions she does not have a Guardian .Pt agreed with dc plan to [facility] . This CM provided pt with info regarding her embedded services within her [facility] plan. TOC will cont to follow dc planning / care coordination and update as applicable.           Expected Discharge Plan and Services    SNF for rehab      Social Drivers of Health (SDOH) Interventions SDOH Screenings   Food Insecurity: No Food  Insecurity (12/11/2023)  Housing: Low Risk  (12/11/2023)  Transportation Needs: Unmet Transportation Needs (12/11/2023)  Utilities: Not At Risk (12/11/2023)  Depression (PHQ2-9): Low Risk  (12/06/2023)  Financial Resource Strain: High Risk (07/06/2023)   Received from [facility]  Social Connections: Unknown (08/12/2023)  Tobacco Use: Low Risk  (12/16/2023)    Readmission Risk Interventions    12/24/2021   12:03 PM  Readmission Risk Prevention Plan  Transportation Screening Complete  Medication Review (RN Care Manager) Complete  PCP or Specialist appointment within 3-5 days of discharge Complete  HRI or Home Care Consult Complete  SW Recovery Care/Counseling Consult Complete  Palliative Care Screening Not Applicable  Skilled Nursing Facility Complete

## 2023-12-19 NOTE — ED Provider Notes (Signed)
 Patient reported some left ear pain and sore throat since yesterday.  I evaluate her throat uvula midline.  No obvious mass or exudates noted.  Full range of motion of neck.  She also has TMs that are clear bilaterally laterally without any redness or warmth of the exterior ear.  No pain or swelling behind the ear.  No evidence of mastoiditis, retropharyngeal abscess, peritonsillar abscess.  Will proceed with Tylenol , strep and COVID testing.     Ernest Ronal BRAVO, MD 12/19/23 [phone]

## 2023-12-19 NOTE — ED Notes (Signed)
 Assumed care of pt. Report received from previous RN. Pt alert and oriented. Pt on CCM and pulse ox at this time. Pt RR even and unlabored. Pt denies any needs at this time.

## 2023-12-19 NOTE — ED Notes (Signed)
 Pt complaining of right ear pain. Pt advised she reported this pain yesterday and has not been evaluated for it yet. EDP notified of same.

## 2023-12-19 NOTE — Progress Notes (Addendum)
 PT Cancellation Note  Patient Details Name: Tonya Myers MRN: [id number] DOB: 12/27/1965   Cancelled Treatment:    Reason Eval/Treat Not Completed: Patient at procedure or test/unavailable (hemodialysis. PT will continue with attempts)  Randine Essex, PT, MPT  Randine LULLA Essex 12/19/2023, 1:54 PM

## 2023-12-19 NOTE — Progress Notes (Addendum)
 [facility]  ROUNDING NOTE   Subjective:   Patient seen and evaluated during dialysis   HEMODIALYSIS FLOWSHEET:  Blood Flow Rate (mL/min): 399 mL/min Arterial Pressure (mmHg): -166.25 mmHg Venous Pressure (mmHg): 250.29 mmHg TMP (mmHg): 7.88 mmHg Ultrafiltration Rate (mL/min): 828 mL/min Dialysate Flow Rate (mL/min): 300 ml/min Dialysis Fluid Bolus: Normal Saline Bolus Amount (mL): 200 mL  Continues to feel weak   Objective:  Vital signs in last 24 hours:  Temp:  [98.2 F (36.8 C)-99.1 F (37.3 C)] 98.2 F (36.8 C) (08/25 0859) Pulse Rate:  [88-115] 93 (08/25 0918) Resp:  [6-22] 18 (08/25 1032) BP: (87-162)/(69-95) 87/69 (08/25 1032) SpO2:  [95 %-100 %] 100 % (08/25 1032)  Weight change:  Filed Weights   12/16/23 0006  Weight: 91.6 kg    Intake/Output: No intake/output data recorded.   Intake/Output this shift:  No intake/output data recorded.  Physical Exam: General: NAD  Head: Normocephalic  Eyes: Anicteric  Lungs:  Clear to auscultation  Heart: Regular rate   Abdomen:  Soft, nontender  Extremities:  trace peripheral edema.  Neurologic: Alert, awake  Skin: No lesions  Access: Rt AVF    Basic Metabolic Panel: Recent Labs  Lab 12/13/23 0338 12/15/23 0957 12/16/23 0123 12/17/23 1031 12/19/23 0950  NA 135 137 136 134* 135  K 4.8 4.2 4.0 5.0 5.4*  CL 96* 96* 94* 94* 93*  CO2 27 21* 20* 24 25  GLUCOSE 260* 118* 153* 310* 203*  BUN 59* 100* 69* 91* 73*  CREATININE 6.91* 10.61* 8.09* 10.57* 8.46*  CALCIUM  8.2* 8.2* 8.3* 8.1* 8.5*  PHOS  --   --   --  7.1* 4.2    Liver Function Tests: Recent Labs  Lab 12/15/23 1130 12/16/23 0123 12/17/23 1031 12/19/23 0950  AST 51* 66*  --   --   ALT 15 17  --   --   ALKPHOS 63 63  --   --   BILITOT 1.1 1.1  --   --   PROT 6.5 6.9  --   --   ALBUMIN  2.9* 3.0* 2.6* 2.9*   Recent Labs  Lab 12/15/23 1130  LIPASE 85*   No results for input(s): AMMONIA in the last 168  hours.  CBC: Recent Labs  Lab 12/13/23 0338 12/15/23 0957 12/16/23 0123 12/17/23 1031  WBC 4.0 4.6 4.1 4.9  HGB 8.5* 9.5* 9.8* 11.6*  HCT 23.6* 27.5* 27.3* 34.6*  MCV 95.9 95.5 94.1 95.8  PLT 108* 53* 54* 49*    Cardiac Enzymes: No results for input(s): CKTOTAL, CKMB, CKMBINDEX, TROPONINI in the last 168 hours.  BNP: Invalid input(s): POCBNP  CBG: Recent Labs  Lab 12/12/23 1143 12/12/23 1632 12/12/23 2053 12/13/23 0726 12/13/23 1138  GLUCAP 152* 161* 270* 217* 184*    Microbiology: Results for orders placed or performed during the hospital encounter of 12/10/23  SARS Coronavirus 2 by RT PCR (hospital order, performed in [facility] hospital lab) *cepheid single result test* Anterior Nasal Swab     Status: None   Collection Time: 12/11/23 10:00 AM   Specimen: Anterior Nasal Swab  Result Value Ref Range Status   SARS Coronavirus 2 by RT PCR NEGATIVE NEGATIVE Final    Comment: (NOTE) SARS-CoV-2 target nucleic acids are NOT DETECTED.  The SARS-CoV-2 RNA is generally detectable in upper and lower respiratory specimens during the acute phase of infection. The lowest concentration of SARS-CoV-2 viral copies this assay can detect is 250 copies / mL. A negative result does  not preclude SARS-CoV-2 infection and should not be used as the sole basis for treatment or other patient management decisions.  A negative result may occur with improper specimen collection / handling, submission of specimen other than nasopharyngeal swab, presence of viral mutation(s) within the areas targeted by this assay, and inadequate number of viral copies (<250 copies / mL). A negative result must be combined with clinical observations, patient history, and epidemiological information.  Fact Sheet for Patients:   RoadLapTop.co.za  Fact Sheet for Healthcare Providers: http://kim-miller.com/  This test is not yet approved or  cleared by  the [place]  FDA and has been authorized for detection and/or diagnosis of SARS-CoV-2 by FDA under an Emergency Use Authorization (EUA).  This EUA will remain in effect (meaning this test can be used) for the duration of the COVID-19 declaration under Section 564(b)(1) of the Act, 21 U.S.C. section 360bbb-3(b)(1), unless the authorization is terminated or revoked sooner.  Performed at [facility], [address] [place], [place] [address]   Respiratory (~20 pathogens) panel by PCR     Status: None   Collection Time: 12/11/23 10:00 AM   Specimen: Nasopharyngeal Swab; Respiratory  Result Value Ref Range Status   Adenovirus NOT DETECTED NOT DETECTED Final   Coronavirus 229E NOT DETECTED NOT DETECTED Final    Comment: (NOTE) The Coronavirus on the Respiratory Panel, DOES NOT test for the novel  Coronavirus (2019 nCoV)    Coronavirus HKU1 NOT DETECTED NOT DETECTED Final   Coronavirus NL63 NOT DETECTED NOT DETECTED Final   Coronavirus OC43 NOT DETECTED NOT DETECTED Final   Metapneumovirus NOT DETECTED NOT DETECTED Final   Rhinovirus / Enterovirus NOT DETECTED NOT DETECTED Final   Influenza A NOT DETECTED NOT DETECTED Final   Influenza B NOT DETECTED NOT DETECTED Final   Parainfluenza Virus 1 NOT DETECTED NOT DETECTED Final   Parainfluenza Virus 2 NOT DETECTED NOT DETECTED Final   Parainfluenza Virus 3 NOT DETECTED NOT DETECTED Final   Parainfluenza Virus 4 NOT DETECTED NOT DETECTED Final   Respiratory Syncytial Virus NOT DETECTED NOT DETECTED Final   Bordetella pertussis NOT DETECTED NOT DETECTED Final   Bordetella Parapertussis NOT DETECTED NOT DETECTED Final   Chlamydophila pneumoniae NOT DETECTED NOT DETECTED Final   Mycoplasma pneumoniae NOT DETECTED NOT DETECTED Final    Comment: Performed at [facility], [address], [place], [place] [address]    Coagulation Studies: No results for input(s): LABPROT, INR in the last 72 hours.  Urinalysis: No  results for input(s): COLORURINE, LABSPEC, PHURINE, GLUCOSEU, HGBUR, BILIRUBINUR, KETONESUR, PROTEINUR, UROBILINOGEN, NITRITE, LEUKOCYTESUR in the last 72 hours.  Invalid input(s): APPERANCEUR    Imaging: No results found.   Medications:     amitriptyline   25 mg Oral QHS   amLODipine   5 mg Oral Daily   atorvastatin   20 mg Oral Daily   calcium  acetate  1,334 mg Oral TID WC   cefUROXime   500 mg Oral Q M,W,F-HD   fluticasone   2 spray Each Nare Daily   folic acid   1 mg Oral Daily   levothyroxine   125 mcg Oral Once per day on Monday Tuesday Wednesday Thursday Friday Saturday   levothyroxine   187.5 mcg Oral Q Sun   lipase/protease/amylase  12,000 Units Oral TID WC   loratadine   10 mg Oral Daily   midodrine   2.5 mg Oral Q M,W,F   multivitamin  1 tablet Oral QHS   pantoprazole   40 mg Oral Daily   zinc  sulfate (  50mg  elemental zinc )  220 mg Oral Daily   acetaminophen , albuterol , guaiFENesin -dextromethorphan , hydrOXYzine , traMADol   Assessment/ Plan:  Ms. Gayleen Sholtz is a 58 y.o.  female past medical history of end-stage renal disease on hemodialysis Monday Wednesday Friday schedule, ANCA related glomerulonephritis, diet-controlled diabetes, orthostatic hypotension and thrombocytopenia and CLL now admitted with complaints on generalized weakness and inability to care for herself. She is now being treated with antibiotics for pneumonia.   [facility]/MWF/Rt AVF   Generalized weakness in the setting of ESRD on hemodialysis: last dialysis received Thursday. Missed treatment because she left hospital and returned. Having HD today in place of missed treatment.  Awaiting rehab placement   ANEMIA with chronic kidney disease: Hgb 11.6, acceptable. No need for ESA during this admission   Secondary Hyperparathyroidism: with outpatient labs: PTH 568, phosphorus 5.1, calcium  9.0 on 11/28/23 monitor calcium /phosphorus during this admission. Currently prescribed  calcitriol and calcium  acetate outpatient. Calcium  and phosphorus within optimal range. Continue calcium  acetate with meals.    4. Hypertension with chronic kidney disease. Home regimen includes midodrine , currently held. Receiving hydrochlorothiazide  during this admission. Experienced hypotension during treatment, received NS bolus.    5. 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 Most recent hemoglobin A1c is 5.6 on 07/29/23.       LOS: 0 Tonya Myers 8/25/202510:46 AM  Patient was seen and examined with Tonya Harris, NP.  Plan of care was formulated for the problems addressed and discussed with NP.  I agree with the note as documented except as noted below.

## 2023-12-19 NOTE — ED Notes (Signed)
 Pt called out stating that she needed to order breakfast. Explained to pt that it is 19:32. Pt confused asking if she has slept all day. Offered pt a sandwich. Pt accepted and no further needs at this time. Fall risk bundle in place and call light within reach.

## 2023-12-19 NOTE — ED Notes (Signed)
 Pt called out for soiled brief. This RN performed peri care, gave pt a new brief and linen change. As this RN was changing pt's linen multiple medications were found in the bed. This RN discarded those medication. Pt states those medications probably fell out of the medication cup this afternoon and I didn't realize. Pt then asked for PRN medications for pain, itching, and coughing. See eMAR.

## 2023-12-19 NOTE — Progress Notes (Signed)
 Hemodialysis Note:  Received patient in bed to unit. Alert and oriented. Informed consent singed and in chart.  Treatment initiated: 0918 Treatment completed: 1300  Access used: Right AVG Access issues: None  300 ml saline bolus given and BFR lowered down to 300 due to hypotension.Transported back to room, alert without acute distress. Report given to patient's RN.  Total UF removed: 200 ml. UF decreased due to hypotension Medications given: None  Post HD weight: unable to get weight, bed scale not working  [name]

## 2023-12-19 NOTE — ED Notes (Addendum)
 Pt is returning back from dialysis. Pt is lying in bed, alert and oriented, and in NAD. Pt fed bagged lunch meal. Pt advised is bed bound and uses brief. Pt advised brief dry at this time. Pt repositioned in the bed.   Pt skid proof socks, fall bracelet, and bed alarm in place.

## 2023-12-19 NOTE — ED Notes (Signed)
 Pt states she is not going to eat enough dinner to take 1700 meds.

## 2023-12-20 ENCOUNTER — Ambulatory Visit
Attending: Student in an Organized Health Care Education/Training Program | Admitting: Student in an Organized Health Care Education/Training Program

## 2023-12-20 MED ORDER — CAPSAICIN-CLEANSING GEL 8 % EX KIT
4.0000 | PACK | Freq: Once | CUTANEOUS | Status: AC
  Filled 2023-12-20: qty 4

## 2023-12-20 NOTE — ED Notes (Signed)
 Pt given a breakfast tray

## 2023-12-20 NOTE — ED Provider Notes (Signed)
-----------------------------------------   5:33 AM on 12/20/2023 -----------------------------------------   Blood pressure 119/67, pulse (!) 113, temperature 98 F (36.7 C), temperature source Oral, resp. rate 20, height 6' (1.829 m), weight 91.6 kg, SpO2 100%.  The patient is calm and cooperative at this time.  There have been no acute events since the last update.  Awaiting disposition plan from case management/social work.    Emmy Keng, Josette SAILOR, DO 12/20/23 [phone]

## 2023-12-20 NOTE — TOC Progression Note (Addendum)
 Transition of Care [facility]) - Progression Note    Patient Details  Name: Tonya Myers MRN: [id number] Date of Birth: October 10, 1965  [facility]) [name] Contact  [name], [name] Phone Number: 12/20/2023, 12:23 PM  Clinical Narrative:  Left voicemail for admissions coordinator at [facility].   3:15 pm: Admissions coordinator did not receive referral. CSW faxed again. She will need to speak with patient/family to confirm plan is to go home after rehab.  Expected Discharge Plan and Services                                               Social Drivers of Health (SDOH) Interventions SDOH Screenings   Food Insecurity: No Food Insecurity (12/11/2023)  Housing: Low Risk  (12/11/2023)  Transportation Needs: Unmet Transportation Needs (12/11/2023)  Utilities: Not At Risk (12/11/2023)  Depression (PHQ2-9): Low Risk  (12/06/2023)  Financial Resource Strain: High Risk (07/06/2023)   Received from [facility]  Social Connections: Unknown (08/12/2023)  Tobacco Use: Low Risk  (12/16/2023)    Readmission Risk Interventions    12/24/2021   12:03 PM  Readmission Risk Prevention Plan  Transportation Screening Complete  Medication Review (RN Care Manager) Complete  PCP or Specialist appointment within 3-5 days of discharge Complete  HRI or Home Care Consult Complete  SW Recovery Care/Counseling Consult Complete  Palliative Care Screening Not Applicable  Skilled Nursing Facility Complete

## 2023-12-20 NOTE — Progress Notes (Signed)
 Physical Therapy Treatment Patient Details Name: Tonya Myers MRN: [id number] DOB: 07/01/1965 Today's Date: 12/20/2023   History of Present Illness P is a 58 y.o. female who returns to the ED for generalized weakness and inability to care for herself. She was just recently hospitalized with multiple issues but most specifically pneumonia. 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  Stating she wants to go in the chair after using the bathroom.  She stands on second attempt with mod a x 1 and is able to walk to ED toilet with min a x 1 and generally weak appearance.  Sits with mod a x 1 to control descent for large formed BM.  Max a for care.  Pt then walks back towards recliner but does opt to sit on bed stating her legs are Jello.  Stated she would rest a bit then walk 3 steps to chair but while resting she opts to return to supine stating she was too tired to get to recliner.   Pt positioned per request in bed for comfort and safety.   If plan is discharge home, recommend the following: A little help with walking and/or transfers;Assistance with cooking/housework;Assist for transportation;Help with stairs or ramp for entrance   Can travel by private vehicle        Equipment Recommendations  None recommended by PT    Recommendations for Other Services       Precautions / Restrictions Precautions Precautions: Fall Recall of Precautions/Restrictions: Intact Restrictions Weight Bearing Restrictions Per Provider Order: No      Mobility  Bed Mobility Overal bed mobility: Needs Assistance Bed Mobility: Supine to Sit, Sit to Supine     Supine to sit: Min assist, Contact guard Sit to supine: Contact guard assist     Patient Response: Impulsive  Transfers Overall transfer level: Needs assistance Equipment used: Rolling walker (2 wheels) Transfers: Sit to/from Stand Sit to Stand: Min assist, Mod assist           General transfer comment: increased assist intially and from lower ED toilet    Ambulation/Gait Ambulation/Gait assistance: Min assist Gait Distance (Feet): 10 Feet Assistive device: Rolling walker (2 wheels) Gait Pattern/deviations: Step-to pattern, Step-through pattern Gait velocity: slow gait speed     General Gait Details: 10' x 2 to/from toilet   Stairs             Wheelchair Mobility     Tilt Bed Tilt Bed Patient Response: Impulsive  Modified Rankin (Stroke Patients Only)       Balance Overall balance assessment: Needs assistance Sitting-balance support: Feet supported Sitting balance-Leahy Scale: Fair     Standing balance support: Reliant on assistive device for balance, Bilateral upper extremity supported, During functional activity Standing balance-Leahy Scale: Poor Standing balance comment: general weakness with unsteady gait and weak LE's                            Communication Communication Communication: No apparent difficulties  Cognition Arousal: Alert Behavior During Therapy: Flat affect, Impulsive   PT - Cognitive impairments: Difficult to assess  Following commands: Impaired Following commands impaired: Follows one step commands inconsistently    Cueing Cueing Techniques: Verbal cues, Tactile cues  Exercises Other Exercises Other Exercises: to bathroom for large BM    General Comments        Pertinent Vitals/Pain Pain Assessment Pain Assessment: Faces Faces Pain Scale: Hurts even  more Pain Location: R wrist and BL feet Pain Descriptors / Indicators: Aching, Sore Pain Intervention(s): Limited activity within patient's tolerance, Repositioned, Monitored during session    Home Living                          Prior Function            PT Goals (current goals can now be found in the care plan section) Progress towards PT goals: Progressing toward goals    Frequency    Min 2X/week      PT Plan      Co-evaluation              AM-PAC PT 6 Clicks Mobility   Outcome Measure  Help needed turning from your back to your side while in a flat bed without using bedrails?: None Help needed moving from lying on your back to sitting on the side of a flat bed without using bedrails?: A Little Help needed moving to and from a bed to a chair (including a wheelchair)?: A Little Help needed standing up from a chair using your arms (e.g., wheelchair or bedside chair)?: A Lot Help needed to walk in hospital room?: A Little Help needed climbing 3-5 steps with a railing? : Total 6 Click Score: 16    End of Session Equipment Utilized During Treatment: Gait belt Activity Tolerance: Patient tolerated treatment well;Patient limited by fatigue Patient left: in bed;with call bell/phone within reach;with bed alarm set Nurse Communication: Mobility status PT Visit Diagnosis: Unsteadiness on feet (R26.81);Other abnormalities of gait and mobility (R26.89);History of falling (Z91.81);Muscle weakness (generalized) (M62.81);Difficulty in walking, not elsewhere classified (R26.2)     Time: 8890-8878 PT Time Calculation (min) (ACUTE ONLY): 12 min  Charges:    $Gait Training: 8-22 mins PT General Charges $$ ACUTE PT VISIT: 1 Visit                   Lauraine Gills, PTA 12/20/23, 11:46 AM]

## 2023-12-20 NOTE — Progress Notes (Signed)
 Occupational Therapy Treatment Patient Details Name: Tonya Myers MRN: [id number] DOB: 11/12/1965 Today's Date: 12/20/2023   History of present illness P is a 58 y.o. female who returns to the ED for generalized weakness and inability to care for herself. She was just recently hospitalized with multiple issues but most specifically pneumonia. 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 Upon arrival to room pt resting in bed with HOB elevated, agreeable to tx with minimal verbal encouragement. Pt requires CGA for all bed mobility with use of single bed rail for UE support. Pt tolerated sitting on the EOB ~27mins completing face washing with setupA. Pt declined further mobility and ADL participation, stating she felt too fatigued to do more, eager to return to supine. Pt making progress toward goals, will continue to follow POC. Discharge recommendation remains appropriate.        If plan is discharge home, recommend the following:  A lot of help with bathing/dressing/bathroom;A lot of help with walking and/or transfers;Help with stairs or ramp for entrance;Assistance with cooking/housework   Equipment Recommendations  Other (comment)    Recommendations for Other Services      Precautions / Restrictions Precautions Precautions: Fall Recall of Precautions/Restrictions: Intact Restrictions Weight Bearing Restrictions Per Provider Order: No       Mobility Bed Mobility Overal bed mobility: Needs Assistance Bed  Mobility: Supine to Sit, Sit to Supine     Supine to sit: Contact guard Sit to supine: Contact guard assist   General bed mobility comments: No physical support required to come from supine<>sitting, HOB slightly elevated.    Transfers                   General transfer comment: Pt declined OOB mobility on this date     Balance Overall balance assessment: Needs assistance Sitting-balance support: Feet unsupported, No upper extremity supported Sitting balance-Leahy Scale: Fair Sitting balance - Comments: No UE support required to remain upright dangling on the EOB                                   ADL either performed or assessed with clinical judgement   ADL Overall ADL's : Needs assistance/impaired     Grooming: Wash/dry face;Wash/dry hands;Sitting;Set up                                 General ADL Comments: Declined further ADL participation, allowing only sitting on the EOB for face washing     Communication Communication Communication: No apparent difficulties   Cognition Arousal: Alert Behavior During Therapy: Flat affect, Anxious Cognition: No family/caregiver present to determine baseline, Cognition impaired           Executive functioning impairment (select all impairments): Problem solving, Reasoning OT - Cognition Comments: A/Ox4                 Following commands: Impaired Following commands impaired: Follows one step commands inconsistently      Cueing   Cueing  Techniques: Verbal cues, Tactile cues  Exercises Exercises: Other exercises Other Exercises Other Exercises: Edu; Role of OT treatment, benefits of OOB mobility    Shoulder Instructions       General Comments Endorses fatigue easily during movement/ADLs    Pertinent Vitals/ Pain       Pain Assessment Pain Assessment: Faces Faces Pain Scale: No hurt                                                           Frequency  Min 2X/week        Progress Toward Goals  OT Goals(current goals can now be found in the care plan section)  Progress towards OT goals: Progressing toward goals  Acute Rehab OT Goals OT Goal Formulation: With patient Time For Goal Achievement: 12/30/23 Potential to Achieve Goals: Good ADL Goals Pt Will Perform Lower Body Bathing: with supervision;sitting/lateral leans;sit to/from stand Pt Will Perform Lower Body Dressing: with supervision;sitting/lateral leans;sit to/from stand Pt Will Transfer to Toilet: with supervision;ambulating;regular height toilet   AM-PAC OT 6 Clicks Daily Activity     Outcome Measure   Help from another person eating meals?: None Help from another person taking care of personal grooming?: A Little Help from another person toileting, which includes using toliet, bedpan, or urinal?: A Lot Help from another person bathing (including washing, rinsing, drying)?: A Lot Help from another person to put on and taking off regular upper body clothing?: A Little Help from another person to put on and taking off regular lower body clothing?: A Little 6 Click Score: 17    End of Session    OT Visit Diagnosis: Other abnormalities of gait and mobility (R26.89);Muscle weakness (generalized) (M62.81);Repeated falls (R29.6)   Activity Tolerance Patient tolerated treatment well   Patient Left in bed;with call bell/phone within reach;with bed alarm set   Nurse Communication Other (comment) (Pt request benadryl )        Time: 8851-8842 OT Time Calculation (min): 9 min  Charges: OT General Charges $OT Visit: 1 Visit OT Treatments $Self Care/Home Management : 8-22 mins  Larraine Colas M.S. OTR/L  12/20/23, 12:09 PM

## 2023-12-21 LAB — RENAL FUNCTION PANEL
Albumin: 2.5 g/dL — ABNORMAL LOW (ref 3.5–5.0)
Anion gap: 16 — ABNORMAL HIGH (ref 5–15)
BUN: 60 mg/dL — ABNORMAL HIGH (ref 6–20)
CO2: 26 mmol/L (ref 22–32)
Calcium: 8.2 mg/dL — ABNORMAL LOW (ref 8.9–10.3)
Chloride: 94 mmol/L — ABNORMAL LOW (ref 98–111)
Creatinine, Ser: 8.5 mg/dL — ABNORMAL HIGH (ref 0.44–1.00)
GFR, Estimated: 5 mL/min — ABNORMAL LOW (ref >60)
Glucose, Bld: 162 mg/dL — ABNORMAL HIGH (ref 70–99)
Phosphorus: 4.7 mg/dL — ABNORMAL HIGH (ref 2.5–4.6)
Potassium: 5.1 mmol/L (ref 3.5–5.1)
Sodium: 136 mmol/L (ref 135–145)

## 2023-12-21 LAB — CBC
HCT: 24.5 % — ABNORMAL LOW (ref 36.0–46.0)
Hemoglobin: 8.5 g/dL — ABNORMAL LOW (ref 12.0–15.0)
MCH: 33.5 pg (ref 26.0–34.0)
MCHC: 34.7 g/dL (ref 30.0–36.0)
MCV: 96.5 fL (ref 80.0–100.0)
Platelets: 47 K/uL — ABNORMAL LOW (ref 150–400)
RBC: 2.54 MIL/uL — ABNORMAL LOW (ref 3.87–5.11)
RDW: 20 % — ABNORMAL HIGH (ref 11.5–15.5)
WBC: 4.9 K/uL (ref 4.0–10.5)
nRBC: 0 % (ref 0.0–0.2)

## 2023-12-21 MED ORDER — OXYCODONE HCL 5 MG PO TABS
5.0000 mg | ORAL_TABLET | Freq: Once | ORAL | Status: AC
  Administered 2023-12-21: 5 mg via ORAL

## 2023-12-21 MED ORDER — MIDODRINE HCL 5 MG PO TABS
ORAL_TABLET | ORAL | Status: AC
  Filled 2023-12-21: qty 1

## 2023-12-21 MED ORDER — OXYCODONE HCL 5 MG PO TABS
ORAL_TABLET | ORAL | Status: AC
Start: 2023-12-21 — End: 2023-12-21
  Filled 2023-12-21: qty 1

## 2023-12-21 NOTE — ED Notes (Signed)
 Taken to dialysis

## 2023-12-21 NOTE — Progress Notes (Signed)
 Physical Therapy Treatment Patient Details Name: Tonya Myers MRN: [id number] DOB: 1965/09/15 Today's Date: 12/21/2023   History of Present Illness Pt is a 58 y.o. female who returns to the ED for generalized weakness and inability to care for herself. She was just recently hospitalized with multiple issues but most specifically pneumonia. 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  She could not initially initiate movement and needed direct assist/tactile cuing to get up to sitting.  She maintained balance relatively well at EOB, but consistently and w/o warning (or apparent impetus) would lean over onto her L elbow and hug her pillow - gentle probing of what/why/etc were never met with verbalizations.  She did try to stand a few times on her own w/o success getting hips off bed, on 2 heavily assisted attempt we did attain standing.  On first attempt she stood for ~10 seconds and as we were getting ready to take a few steps she just buckled and collapsed back onto the bed w/o warning.  After seated rest, fidgeting and extensive discussion of UE/AD use and plan to stay close to bed and do some marching in place she again tried, after much encouragement, to get up w/o success and when PT assisted to standing she again crashed back onto the bed after minimal time in standing and getting only 1 partial marching  step in at EOB.  Pt very functionally limited today, needed excessive cuing and encouragement for a modicum of effort.  Pt needs ongoing PT, continue with POC.     If plan is discharge home, recommend the following: A little help with walking and/or transfers;Assistance with cooking/housework;Assist for transportation;Help with stairs or ramp for entrance   Can travel by private vehicle     Yes  Equipment Recommendations  None recommended by PT    Recommendations for Other Services       Precautions / Restrictions Precautions Precautions: Fall Recall of Precautions/Restrictions: Impaired (displays poor situational and general awareness) Restrictions Weight Bearing Restrictions Per Provider Order: No     Mobility  Bed Mobility Overal bed mobility: Needs Assistance Bed Mobility: Supine to Sit, Sit to Supine     Supine to sit: Min assist Sit to supine: Mod assist   General bed mobility comments: able to get LEs toward EOB, assist to actually get trunk upright.  Could not get her legs back into bed and asked for assist to raise them    Transfers Overall transfer level: Needs assistance Equipment used: Rolling walker (2 wheels) Transfers: Sit to/from Stand Sit to Stand: Min assist, Mod assist           General transfer comment: Pt could not initiate upward movement on her own, needed direct PT assist to faciliate upward/forward movement.  Multiple sit to stand efforts, all short lived and relatively unsafe.    Ambulation/Gait               General Gait Details: initial discussion of brief walk down hall and back and pt indicates  agreement.  However on each standing attempt she quickly returned to sitting w/o warning and futher away from bed plans were aborted   Stairs             Wheelchair Mobility     Tilt Bed    Modified Rankin (Stroke Patients Only)       Balance Overall balance assessment: Needs assistance Sitting-balance support: Feet  unsupported, No upper extremity supported Sitting balance-Leahy Scale: Fair       Standing balance-Leahy Scale: Poor Standing balance comment: general weakness - quick to buckleand weak LE's                            Communication Communication Communication:  (very limited verbalization despite PT consistently asking questions and giving encouragement for even basic answers)  Cognition Arousal: Alert Behavior During Therapy: Restless, Anxious, Flat affect   PT - Cognitive impairments: Difficult to assess                       PT - Cognition Comments: ostensibly willing to participate with PT, but very much struggled t/o the session to meaningfuly sustain participation.  could not answer basic questions, poor insight Following commands: Impaired Following commands impaired: Follows one step commands inconsistently    Cueing Cueing Techniques: Verbal cues, Tactile cues  Exercises      General Comments General comments (skin integrity, edema, etc.): reports being tired from dialysis, struggled to stay on task frquently leaning over from sitting onto L elbow and hugging her pillow - could not expound on her feeling/etc despite much gentle engagement      Pertinent Vitals/Pain Pain Assessment Pain Assessment: Faces Faces Pain Scale: No hurt    Home Living                          Prior Function            PT Goals (current goals can now be found in the care plan section) Progress towards PT goals:  (slow progress)    Frequency    Min 2X/week      PT Plan      Co-evaluation              AM-PAC PT 6 Clicks Mobility   Outcome Measure  Help needed turning from your back to your side while in a flat bed without using bedrails?: None Help needed moving from lying on your back to sitting on the side of a flat bed without using bedrails?: A Little Help needed moving to and from a bed to a chair (including a wheelchair)?: A  Lot Help needed standing up from a chair using your arms (e.g., wheelchair or bedside chair)?: A Lot Help needed to walk in hospital room?: A Lot Help needed climbing 3-5 steps with a railing? : Total 6 Click Score: 14    End of Session Equipment Utilized During Treatment: Gait belt Activity Tolerance: Patient limited by fatigue Patient left: in bed;with call bell/phone within reach (ED hallway room) Nurse Communication: Mobility status PT Visit Diagnosis: Unsteadiness on feet (R26.81);Other abnormalities of gait and mobility (R26.89);History of falling (Z91.81);Muscle weakness (generalized) (M62.81);Difficulty in walking, not elsewhere classified (R26.2)     Time: 8253-8241 PT Time Calculation (min) (ACUTE ONLY): 12 min  Charges:    $Therapeutic Activity: 8-22 mins PT General Charges $$ ACUTE PT VISIT: 1 Visit  Carmin JONELLE Deed, DPT 12/21/2023, 5:22 PM

## 2023-12-21 NOTE — TOC Progression Note (Signed)
 Transition of Care [facility]) - Progression Note    Patient Details  Name: Tonya Myers MRN: [id number] Date of Birth: [birth year]  Transition of Care [facility] [name] Contact  Marinda Cooks, RN Phone Number: 12/21/2023, 9:51 AM  Clinical Narrative:    This CM called and spoke with Admission liaison Lonell  at [employer] regarding pt's dc plan and bed referral . Lonell informed she rec'd it and was reviewing . TOC will cont to follow dc planning / care coordination and update as applicable.    Expected Discharge Plan and Services                                               Social Drivers of Health (SDOH) Interventions SDOH Screenings   Food Insecurity: No Food Insecurity (12/11/2023)  Housing: Low Risk  (12/11/2023)  Transportation Needs: Unmet Transportation Needs (12/11/2023)  Utilities: Not At Risk (12/11/2023)  Depression (PHQ2-9): Low Risk  (12/06/2023)  Financial Resource Strain: High Risk (07/06/2023)   Received from [facility]  Social Connections: Unknown (08/12/2023)  Tobacco Use: Low Risk  (12/16/2023)    Readmission Risk Interventions    12/24/2021   12:03 PM  Readmission Risk Prevention Plan  Transportation Screening Complete  Medication Review (RN Care Manager) Complete  PCP or Specialist appointment within 3-5 days of discharge Complete  HRI or Home Care Consult Complete  SW Recovery Care/Counseling Consult Complete  Palliative Care Screening Not Applicable  Skilled Nursing Facility Complete

## 2023-12-21 NOTE — ED Provider Notes (Signed)
 Emergency Medicine Observation Re-evaluation Note  Tonya Myers is a 58 y.o. female, seen on rounds today.  Pt initially presented to the ED for complaints of Weakness Currently, the patient is resting, no complaints  Physical Exam  BP 135/77   Pulse 100   Temp 98.5 F (36.9 C) (Oral)   Resp 18   Ht 6' (1.829 m)   Wt 88.3 kg   SpO2 98%   BMI 26.40 kg/m  Physical Exam General: resting comfortably Cardiac: Well perfused Lungs: Even chest rise   ED Course / MDM  EKG:   I have reviewed the labs performed to date as well as medications administered while in observation.  Recent changes in the last 24 hours include Got a full course of dialysis today  Plan  Current plan is for Per TOC. Will need dialysis on 12/23/23     Tonya Myers BRAVO, MD 12/21/23 1500

## 2023-12-21 NOTE — ED Provider Notes (Signed)
 5:01 AM Assumed care for off going team.   Blood pressure 111/69, pulse (!) 111, temperature 98.5 F (36.9 C), resp. rate 20, height 6' (1.829 m), weight 91.6 kg, SpO2 100%.  See their HPI for full report but in brief     The patient is calm and cooperative at this time.  There have been no acute events since the last update.  Awaiting disposition plan from case management/social work.       Ernest Ronal BRAVO, MD 12/21/23 [phone]

## 2023-12-21 NOTE — Progress Notes (Signed)
 Hemodialysis Note:  Received patient in bed to unit. Alert and oriented. Informed consent singed and in chart.  Treatment initiated: 0800 Treatment completed: 1222  Access used: Right AVG Access issues: low arterial pressure and high venous pressure due to pt keeps moving her arm  Patient tolerated well. Transported back to room, alert without acute distress. Report given to patient's RN.  Total UF removed: 0 Medications given: Midodrine  2.5 mg Tablet, Oxycodone  5 mg Tablet  Post HD weight: 88.3 kg  [name]

## 2023-12-21 NOTE — Progress Notes (Addendum)
 [facility]  ROUNDING NOTE   Subjective:   Patient seen and evaluated during dialysis   HEMODIALYSIS FLOWSHEET:  Blood Flow Rate (mL/min): 249 mL/min Arterial Pressure (mmHg): -149.08 mmHg Venous Pressure (mmHg): 216.55 mmHg TMP (mmHg): 22.82 mmHg Ultrafiltration Rate (mL/min): 464 mL/min Dialysate Flow Rate (mL/min): 300 ml/min Dialysis Fluid Bolus: Normal Saline Bolus Amount (mL): 100 mL  Continues to complain of fatigue and generalized pain  Objective:  Vital signs in last 24 hours:  Temp:  [98.5 F (36.9 C)-98.9 F (37.2 C)] 98.9 F (37.2 C) (08/27 0730) Pulse Rate:  [93-111] 94 (08/27 0930) Resp:  [14-20] 14 (08/27 0900) BP: (98-158)/(69-129) 139/72 (08/27 0930) SpO2:  [99 %-100 %] 100 % (08/27 0930) Weight:  [88.3 kg] 88.3 kg (08/27 0730)  Weight change:  Filed Weights   12/16/23 0006 12/21/23 0730  Weight: 91.6 kg 88.3 kg    Intake/Output: No intake/output data recorded.   Intake/Output this shift:  No intake/output data recorded.  Physical Exam: General: NAD  Head: Normocephalic  Eyes: Anicteric  Lungs:  Clear to auscultation  Heart: Regular rate   Abdomen:  Soft, nontender  Extremities:  trace peripheral edema.  Neurologic: Alert, awake  Skin: No lesions  Access: Rt AVF    Basic Metabolic Panel: Recent Labs  Lab 12/15/23 0957 12/16/23 0123 12/17/23 1031 12/19/23 0950 12/21/23 0825  NA 137 136 134* 135 136  K 4.2 4.0 5.0 5.4* 5.1  CL 96* 94* 94* 93* 94*  CO2 21* 20* 24 25 26   GLUCOSE 118* 153* 310* 203* 162*  BUN 100* 69* 91* 73* 60*  CREATININE 10.61* 8.09* 10.57* 8.46* 8.50*  CALCIUM  8.2* 8.3* 8.1* 8.5* 8.2*  PHOS  --   --  7.1* 4.2 4.7*    Liver Function Tests: Recent Labs  Lab 12/15/23 1130 12/16/23 0123 12/17/23 1031 12/19/23 0950 12/21/23 0825  AST 51* 66*  --   --   --   ALT 15 17  --   --   --   ALKPHOS 63 63  --   --   --   BILITOT 1.1 1.1  --   --   --   PROT 6.5 6.9  --   --   --   ALBUMIN  2.9*  3.0* 2.6* 2.9* 2.5*   Recent Labs  Lab 12/15/23 1130  LIPASE 85*   No results for input(s): AMMONIA in the last 168 hours.  CBC: Recent Labs  Lab 12/15/23 0957 12/16/23 0123 12/17/23 1031 12/19/23 0950 12/21/23 0855  WBC 4.6 4.1 4.9 6.1 4.9  HGB 9.5* 9.8* 11.6* 9.4* 8.5*  HCT 27.5* 27.3* 34.6* 28.0* 24.5*  MCV 95.5 94.1 95.8 94.9 96.5  PLT 53* 54* 49* 68* 47*    Cardiac Enzymes: No results for input(s): CKTOTAL, CKMB, CKMBINDEX, TROPONINI in the last 168 hours.  BNP: Invalid input(s): POCBNP  CBG: No results for input(s): GLUCAP in the last 168 hours.   Microbiology: Results for orders placed or performed during the hospital encounter of 12/16/23  Resp panel by RT-PCR (RSV, Flu A&B, Covid) Anterior Nasal Swab     Status: None   Collection Time: 12/19/23  4:21 PM   Specimen: Anterior Nasal Swab  Result Value Ref Range Status   SARS Coronavirus 2 by RT PCR NEGATIVE NEGATIVE Final    Comment: (NOTE) SARS-CoV-2 target nucleic acids are NOT DETECTED.  The SARS-CoV-2 RNA is generally detectable in upper respiratory specimens during the acute phase of infection. The lowest concentration of SARS-CoV-2  viral copies this assay can detect is 138 copies/mL. A negative result does not preclude SARS-Cov-2 infection and should not be used as the sole basis for treatment or other patient management decisions. A negative result may occur with  improper specimen collection/handling, submission of specimen other than nasopharyngeal swab, presence of viral mutation(s) within the areas targeted by this assay, and inadequate number of viral copies(<138 copies/mL). A negative result must be combined with clinical observations, patient history, and epidemiological information. The expected result is Negative.  Fact Sheet for Patients:  BloggerCourse.com  Fact Sheet for Healthcare Providers:  SeriousBroker.it  This  test is no t yet approved or cleared by the [place]  FDA and  has been authorized for detection and/or diagnosis of SARS-CoV-2 by FDA under an Emergency Use Authorization (EUA). This EUA will remain  in effect (meaning this test can be used) for the duration of the COVID-19 declaration under Section 564(b)(1) of the Act, 21 U.S.C.section 360bbb-3(b)(1), unless the authorization is terminated  or revoked sooner.       Influenza A by PCR NEGATIVE NEGATIVE Final   Influenza B by PCR NEGATIVE NEGATIVE Final    Comment: (NOTE) The Xpert Xpress SARS-CoV-2/FLU/RSV plus assay is intended as an aid in the diagnosis of influenza from Nasopharyngeal swab specimens and should not be used as a sole basis for treatment. Nasal washings and aspirates are unacceptable for Xpert Xpress SARS-CoV-2/FLU/RSV testing.  Fact Sheet for Patients: BloggerCourse.com  Fact Sheet for Healthcare Providers: SeriousBroker.it  This test is not yet approved or cleared by the [place]  FDA and has been authorized for detection and/or diagnosis of SARS-CoV-2 by FDA under an Emergency Use Authorization (EUA). This EUA will remain in effect (meaning this test can be used) for the duration of the COVID-19 declaration under Section 564(b)(1) of the Act, 21 U.S.C. section 360bbb-3(b)(1), unless the authorization is terminated or revoked.     Resp Syncytial Virus by PCR NEGATIVE NEGATIVE Final    Comment: (NOTE) Fact Sheet for Patients: BloggerCourse.com  Fact Sheet for Healthcare Providers: SeriousBroker.it  This test is not yet approved or cleared by the [place]  FDA and has been authorized for detection and/or diagnosis of SARS-CoV-2 by FDA under an Emergency Use Authorization (EUA). This EUA will remain in effect (meaning this test can be used) for the duration of the COVID-19 declaration under  Section 564(b)(1) of the Act, 21 U.S.C. section 360bbb-3(b)(1), unless the authorization is terminated or revoked.  Performed at [facility], [address] [place], [place] [address]   Group A Strep by PCR [facility] Only)     Status: None   Collection Time: 12/19/23  4:21 PM   Specimen: Anterior Nasal Swab; Sterile Swab  Result Value Ref Range Status   Group A Strep by PCR NOT DETECTED NOT DETECTED Final    Comment: Performed at [facility], [address] [place], [place] [address]    Coagulation Studies: No results for input(s): LABPROT, INR in the last 72 hours.  Urinalysis: No results for input(s): COLORURINE, LABSPEC, PHURINE, GLUCOSEU, HGBUR, BILIRUBINUR, KETONESUR, PROTEINUR, UROBILINOGEN, NITRITE, LEUKOCYTESUR in the last 72 hours.  Invalid input(s): APPERANCEUR    Imaging: No results found.   Medications:     amitriptyline   25 mg Oral QHS   amLODipine   5 mg Oral Daily   atorvastatin   20 mg Oral Daily   calcium  acetate  1,334 mg Oral TID WC   fluticasone   2 spray Each Nare Daily  folic acid   1 mg Oral Daily   levothyroxine   125 mcg Oral Once per day on Monday Tuesday Wednesday Thursday Friday Saturday   levothyroxine   187.5 mcg Oral Q Sun   lipase/protease/amylase  12,000 Units Oral TID WC   loratadine   10 mg Oral Daily   midodrine   2.5 mg Oral Q M,W,F   multivitamin  1 tablet Oral QHS   pantoprazole   40 mg Oral Daily   zinc  sulfate (50mg  elemental zinc )  220 mg Oral Daily   acetaminophen , albuterol , guaiFENesin -dextromethorphan , hydrOXYzine , traMADol   Assessment/ Plan:  [name] is a 58 y.o.  female past medical history of end-stage renal disease on hemodialysis Monday Wednesday Friday schedule, ANCA related glomerulonephritis, diet-controlled diabetes, orthostatic hypotension and thrombocytopenia and CLL now admitted with complaints on generalized weakness and inability to care for herself.  She is now being treated with antibiotics for pneumonia.   [facility]/MWF/Rt AVF   Generalized weakness in the setting of ESRD on hemodialysis: Receiving treatment today, UF goal 1L. Next treatment scheduled for Friday. Will order Oxycodone  5mg  with dialysis for discomfort.    ANEMIA with chronic kidney disease: Hgb 8.5. No need for ESA during this admission   Secondary Hyperparathyroidism: with outpatient labs: PTH 568, phosphorus 5.1, calcium  9.0 on 11/28/23 monitor calcium /phosphorus during this admission. Currently prescribed calcitriol and calcium  acetate outpatient. Calcium  and phosphorus acceptable.   4. Hypertension with chronic kidney disease. Home regimen includes midodrine , currently held. Receiving hydrochlorothiazide  during this admission. Blood pressure 138/78 during dialysis.    5. 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 Most recent hemoglobin A1c is 5.6 on 07/29/23.       LOS: 0 Jeriel Vivanco 8/27/202510:16 AM

## 2023-12-21 NOTE — Progress Notes (Signed)
 No show

## 2023-12-22 NOTE — ED Notes (Signed)
 Patient woke up stating she felt short of breath. Asked to be sat up in the bed. VS obtained and stable. Breathing treatment started for patient at request but patient continually laying treatment in her lap. Asked if she needed a mask to complete treatment but she refused. This RN attempted to educate on deep breathing and inhalation of medication to help symptoms but no evidence of learning.

## 2023-12-22 NOTE — ED Notes (Signed)
 Pt transferred from recliner to bed, 2 person assist. Brief changed, perineum cleaned, pt boosted in bed

## 2023-12-22 NOTE — ED Provider Notes (Signed)
-----------------------------------------   5:44 AM on 12/22/2023 -----------------------------------------   Blood pressure 135/77, pulse 99, temperature 98.5 F (36.9 C), temperature source Oral, resp. rate 18, height 6' (1.829 m), weight 88.3 kg, SpO2 100%.  The patient is calm and cooperative at this time.  There have been no acute events since the last update.  Awaiting disposition plan from case management/social work.    Bekim Werntz, Josette SAILOR, DO 12/22/23 [phone]

## 2023-12-22 NOTE — Progress Notes (Signed)
 Occupational Therapy Treatment Patient Details Name: Tonya Myers MRN: [id number] DOB: 10-09-1965 Today's Date: 12/22/2023   History of present illness Pt is a 58 y.o. female who returns to the ED for generalized weakness and inability to care for herself. She was just recently hospitalized with multiple issues but most specifically pneumonia. 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 Upon arrival to room pt semi supine in bed, agreeable to tx. Pt requires moderate verbal/tactile cues to facilitate bed mobility. Pt required continuous encouragement to participate in session. Pt frequently would anteriorly lean over her knees, when asked what was wrong pt would say nothing. Pt refused to attempt standing, reporting she felt too weak today. Pt agreed to change her gown, MODA to complete task with heavy multimodal cues. Pt making good progress toward goals, will continue to follow POC. Discharge recommendation remains appropriate.        If plan is discharge home, recommend the following:  A lot of help with bathing/dressing/bathroom;A lot of help with walking and/or transfers;Help with stairs or ramp for entrance;Assistance with cooking/housework   Equipment Recommendations  Other (comment)    Recommendations for Other Services      Precautions / Restrictions Precautions Precautions: Fall Recall of Precautions/Restrictions: Impaired Restrictions Weight Bearing Restrictions Per Provider Order: No       Mobility  Bed Mobility Overal bed mobility: Needs Assistance Bed Mobility: Supine to Sit, Sit to Supine     Supine to sit: Contact guard     General bed mobility comments: verbal cues for initiation of bed mobility sequencing, able to assist in scooting with knee bent    Transfers                   General transfer comment: Pt refused OOB mobility on this date.     Balance Overall balance assessment: Needs assistance Sitting-balance support: Feet unsupported, No upper extremity supported Sitting balance-Leahy Scale: Fair Sitting balance - Comments: limited sitting tolerance on this date, persistantly would rest head back on pillows                                   ADL either performed or assessed with clinical judgement   ADL Overall ADL's : Needs assistance/impaired   Eating/Feeding Details (indicate cue type and reason): Food set up in front of her pt refusing to eat Grooming: Wash/dry face;Wash/dry hands;Sitting;Minimal assistance           Upper Body Dressing : Moderate assistance;Sitting Upper Body Dressing Details (indicate cue type and reason): Heavy cues to initiate tasks                   General ADL Comments: Seated on the EOB, MOD-MINA for dressing tasks and face washing     Communication Communication Communication: No apparent difficulties Factors Affecting Communication: Difficulty expressing self   Cognition Arousal: Alert Behavior During Therapy: Anxious, Flat affect Cognition: No family/caregiver present to determine baseline, Cognition impaired       Memory impairment (select all impairments): Short-term memory, Working [employer] fast streamer,  Non-declarative long-term memory   Executive functioning impairment (select all impairments): Problem solving, Reasoning OT - Cognition Comments: Wouldnt answer orientation questions                 Following commands: Impaired Following commands impaired: Follows one step commands  inconsistently      Cueing   Cueing Techniques: Verbal cues, Gestural cues, Tactile cues, Visual cues  Exercises Exercises: Other exercises Other Exercises Other Exercises: Edu: Role of OT session, safe ADL completion, sequecing ADL tasks           General Comments Pt noted to be flat effect,  bilateral tremors in sitting.    Pertinent Vitals/ Pain       Pain Assessment Pain Assessment: Faces Faces Pain Scale: Hurts a little bit Pain Location: R wrist and BL feet Pain Descriptors / Indicators: Discomfort Pain Intervention(s): Limited activity within patient's tolerance, Monitored during session   Frequency  Min 2X/week        Progress Toward Goals  OT Goals(current goals can now be found in the care plan section)  Progress towards OT goals: Progressing toward goals  Acute Rehab OT Goals OT Goal Formulation: With patient Time For Goal Achievement: 12/30/23 Potential to Achieve Goals: Good ADL Goals Pt Will Perform Lower Body Bathing: with supervision;sitting/lateral leans;sit to/from stand Pt Will Perform Lower Body Dressing: with supervision;sitting/lateral leans;sit to/from stand Pt Will Transfer to Toilet: with supervision;ambulating;regular height toilet  Plan      Co-evaluation                 AM-PAC OT 6 Clicks Daily Activity     Outcome Measure   Help from another person eating meals?: None Help from another person taking care of personal grooming?: A Little Help from another person toileting, which includes using toliet, bedpan, or urinal?: A Lot Help from another person bathing (including washing, rinsing, drying)?: A Lot Help from another person to put on and taking off regular upper body clothing?: A Little Help from another person to put on and taking off regular lower body clothing?: A Little 6 Click Score: 17        OT Visit Diagnosis: Other abnormalities of gait and mobility (R26.89);Muscle weakness (generalized) (M62.81);Repeated  falls (R29.6)   Activity Tolerance Patient tolerated treatment well   Patient Left in bed;with call bell/phone within reach;with bed alarm set   Nurse Communication          Time: 8464-8444 OT Time Calculation (min): 20 min  Charges: OT General Charges $OT Visit: 1 Visit OT Treatments $Self Care/Home Management : 8-22 mins  Larraine Colas M.S. OTR/L  12/22/23, 4:26 PM

## 2023-12-22 NOTE — ED Notes (Signed)
 This tech and [place] edt changed patient after urinary and bowel incontinence. New linen, chucks pad, and brief replaced. Call bell and fall bundle in place.

## 2023-12-22 NOTE — ED Notes (Signed)
 Pt able to have belongings per charge RN

## 2023-12-22 NOTE — Progress Notes (Signed)
 Mobility Specialist - Progress Note     12/22/23 1658  Mobility  Activity Stood at bedside;Pivoted/transferred from bed to chair  Level of Assistance Contact guard assist, steadying assist  Assistive Device Front wheel walker  Distance Ambulated (ft) 3 ft  Range of Motion/Exercises Active  Activity Response Tolerated well  Mobility Referral Yes  Mobility visit 1 Mobility  Mobility Specialist Start Time (ACUTE ONLY) 1643  Mobility Specialist Stop Time (ACUTE ONLY) 1658  Mobility Specialist Time Calculation (min) (ACUTE ONLY) 15 min   Pt resting in bed on RA upon entry. Pt STS +2 and transferred to recliner. Pt left in recliner with needs in reach and chair alarm activated.   Guido Rumble Mobility Specialist 12/22/23, 5:02 PM

## 2023-12-22 NOTE — TOC Progression Note (Addendum)
 Transition of Care [facility]) - Progression Note    Patient Details  Name: Tonya Myers MRN: [id number] Date of Birth: 12/14/1965  Transition of Care [facility]) [name] Contact  [name], [name] Phone Number: 12/22/2023, 10:28 AM  Clinical Narrative:  Left voicemail for [facility] admissions coordinator requesting decision today.   12:58 pm: Tried calling admissions coordinator again. Did not leave a second voicemail.  4:04 pm: CSW spoke to [place] at [employer] [phone]). They can transport to any HD center in [place] or [place] and do not have a preference on MWF or TTS. Sent secure chat to HD coordinator to notify. Will start auth once process is started.  Expected Discharge Plan and Services                                               Social Drivers of Health (SDOH) Interventions SDOH Screenings   Food Insecurity: No Food Insecurity (12/11/2023)  Housing: Low Risk  (12/11/2023)  Transportation Needs: Unmet Transportation Needs (12/11/2023)  Utilities: Not At Risk (12/11/2023)  Depression (PHQ2-9): Low Risk  (12/06/2023)  Financial Resource Strain: High Risk (07/06/2023)   Received from [facility]  Social Connections: Unknown (08/12/2023)  Tobacco Use: Low Risk  (12/16/2023)    Readmission Risk Interventions    12/24/2021   12:03 PM  Readmission Risk Prevention Plan  Transportation Screening Complete  Medication Review (RN Care Manager) Complete  PCP or Specialist appointment within 3-5 days of discharge Complete  HRI or Home Care Consult Complete  SW Recovery Care/Counseling Consult Complete  Palliative Care Screening Not Applicable  Skilled Nursing Facility Complete

## 2023-12-23 LAB — CBC WITH DIFFERENTIAL/PLATELET
Abs Immature Granulocytes: 0.02 K/uL (ref 0.00–0.07)
Basophils Absolute: 0 K/uL (ref 0.0–0.1)
Basophils Relative: 0 %
Eosinophils Absolute: 0.1 K/uL (ref 0.0–0.5)
Eosinophils Relative: 2 %
HCT: 22.5 % — ABNORMAL LOW (ref 36.0–46.0)
Hemoglobin: 7.9 g/dL — ABNORMAL LOW (ref 12.0–15.0)
Immature Granulocytes: 1 %
Lymphocytes Relative: 11 %
Lymphs Abs: 0.4 K/uL — ABNORMAL LOW (ref 0.7–4.0)
MCH: 34.8 pg — ABNORMAL HIGH (ref 26.0–34.0)
MCHC: 35.1 g/dL (ref 30.0–36.0)
MCV: 99.1 fL (ref 80.0–100.0)
Monocytes Absolute: 0.1 K/uL (ref 0.1–1.0)
Monocytes Relative: 3 %
Neutro Abs: 3.1 K/uL (ref 1.7–7.7)
Neutrophils Relative %: 83 %
Platelets: 51 K/uL — ABNORMAL LOW (ref 150–400)
RBC: 2.27 MIL/uL — ABNORMAL LOW (ref 3.87–5.11)
RDW: 21.7 % — ABNORMAL HIGH (ref 11.5–15.5)
Smear Review: NORMAL
WBC: 3.8 K/uL — ABNORMAL LOW (ref 4.0–10.5)
nRBC: 0 % (ref 0.0–0.2)

## 2023-12-23 LAB — RENAL FUNCTION PANEL
Albumin: 2.5 g/dL — ABNORMAL LOW (ref 3.5–5.0)
Anion gap: 19 — ABNORMAL HIGH (ref 5–15)
BUN: 58 mg/dL — ABNORMAL HIGH (ref 6–20)
CO2: 25 mmol/L (ref 22–32)
Calcium: 8.3 mg/dL — ABNORMAL LOW (ref 8.9–10.3)
Chloride: 94 mmol/L — ABNORMAL LOW (ref 98–111)
Creatinine, Ser: 8.67 mg/dL — ABNORMAL HIGH (ref 0.44–1.00)
GFR, Estimated: 5 mL/min — ABNORMAL LOW (ref >60)
Glucose, Bld: 116 mg/dL — ABNORMAL HIGH (ref 70–99)
Phosphorus: 5.2 mg/dL — ABNORMAL HIGH (ref 2.5–4.6)
Potassium: 4.9 mmol/L (ref 3.5–5.1)
Sodium: 138 mmol/L (ref 135–145)

## 2023-12-23 MED ORDER — MELATONIN 3 MG PO TABS
4.0000 mg | ORAL_TABLET | Freq: Every day | ORAL | Status: DC
  Filled 2023-12-23: qty 1

## 2023-12-23 MED ORDER — MELATONIN 5 MG PO TABS
5.0000 mg | ORAL_TABLET | Freq: Every day | ORAL | Status: DC
  Administered 2023-12-23 – 2023-12-30 (×7): 5 mg via ORAL
  Filled 2023-12-23 (×8): qty 1

## 2023-12-23 MED ORDER — MIDODRINE HCL 5 MG PO TABS
ORAL_TABLET | ORAL | Status: AC
  Filled 2023-12-23: qty 1

## 2023-12-23 NOTE — ED Notes (Signed)
 This NT changed pts brief and blankets with the help of Martorell NT.

## 2023-12-23 NOTE — ED Notes (Signed)
 Pt transported to dialysis at this time.

## 2023-12-23 NOTE — Progress Notes (Signed)
 [facility]  ROUNDING NOTE   Subjective:   Patient seen and evaluated during dialysis   HEMODIALYSIS FLOWSHEET:  Blood Flow Rate (mL/min): 349 mL/min Arterial Pressure (mmHg): -110.7 mmHg Venous Pressure (mmHg): 172.31 mmHg TMP (mmHg): 15.15 mmHg Ultrafiltration Rate (mL/min): 773 mL/min Dialysate Flow Rate (mL/min): 300 ml/min Dialysis Fluid Bolus: Normal Saline Bolus Amount (mL): 100 mL  Low grade temp at initiation of treatment. Will monitor during treatment.   Objective:  Vital signs in last 24 hours:  Temp:  [98.7 F (37.1 C)-99.9 F (37.7 C)] 98.7 F (37.1 C) (08/29 0930) Pulse Rate:  [68-112] 111 (08/29 1037) Resp:  [16-20] 20 (08/29 1030) BP: (83-125)/(44-92) 94/61 (08/29 1030) SpO2:  [92 %-98 %] 92 % (08/29 1037) Weight:  [89.5 kg] 89.5 kg (08/29 0810)  Weight change:  Filed Weights   12/21/23 0730 12/21/23 1222 12/23/23 0810  Weight: 88.3 kg 88.3 kg 89.5 kg    Intake/Output: No intake/output data recorded.   Intake/Output this shift:  No intake/output data recorded.  Physical Exam: General: NAD, ill appearing   Head: Normocephalic  Eyes: Anicteric  Lungs:  Clear to auscultation  Heart: Regular rate   Abdomen:  Soft, nontender  Extremities:  trace peripheral edema.  Neurologic: Alert, awake  Skin: No lesions  Access: Rt AVF    Basic Metabolic Panel: Recent Labs  Lab 12/17/23 1031 12/19/23 0950 12/21/23 0825 12/23/23 0818  NA 134* 135 136 138  K 5.0 5.4* 5.1 4.9  CL 94* 93* 94* 94*  CO2 24 25 26 25   GLUCOSE 310* 203* 162* 116*  BUN 91* 73* 60* 58*  CREATININE 10.57* 8.46* 8.50* 8.67*  CALCIUM  8.1* 8.5* 8.2* 8.3*  PHOS 7.1* 4.2 4.7* 5.2*    Liver Function Tests: Recent Labs  Lab 12/17/23 1031 12/19/23 0950 12/21/23 0825 12/23/23 0818  ALBUMIN  2.6* 2.9* 2.5* 2.5*   No results for input(s): LIPASE, AMYLASE in the last 168 hours.  No results for input(s): AMMONIA in the last 168 hours.  CBC: Recent Labs   Lab 12/17/23 1031 12/19/23 0950 12/21/23 0855 12/23/23 0818  WBC 4.9 6.1 4.9 3.8*  NEUTROABS  --   --   --  3.1  HGB 11.6* 9.4* 8.5* 7.9*  HCT 34.6* 28.0* 24.5* 22.5*  MCV 95.8 94.9 96.5 99.1  PLT 49* 68* 47* 51*    Cardiac Enzymes: No results for input(s): CKTOTAL, CKMB, CKMBINDEX, TROPONINI in the last 168 hours.  BNP: Invalid input(s): POCBNP  CBG: No results for input(s): GLUCAP in the last 168 hours.   Microbiology: Results for orders placed or performed during the hospital encounter of 12/16/23  Resp panel by RT-PCR (RSV, Flu A&B, Covid) Anterior Nasal Swab     Status: None   Collection Time: 12/19/23  4:21 PM   Specimen: Anterior Nasal Swab  Result Value Ref Range Status   SARS Coronavirus 2 by RT PCR NEGATIVE NEGATIVE Final    Comment: (NOTE) SARS-CoV-2 target nucleic acids are NOT DETECTED.  The SARS-CoV-2 RNA is generally detectable in upper respiratory specimens during the acute phase of infection. The lowest concentration of SARS-CoV-2 viral copies this assay can detect is 138 copies/mL. A negative result does not preclude SARS-Cov-2 infection and should not be used as the sole basis for treatment or other patient management decisions. A negative result may occur with  improper specimen collection/handling, submission of specimen other than nasopharyngeal swab, presence of viral mutation(s) within the areas targeted by this assay, and inadequate number of viral  copies(<138 copies/mL). A negative result must be combined with clinical observations, patient history, and epidemiological information. The expected result is Negative.  Fact Sheet for Patients:  BloggerCourse.com  Fact Sheet for Healthcare Providers:  SeriousBroker.it  This test is no t yet approved or cleared by the [place]  FDA and  has been authorized for detection and/or diagnosis of SARS-CoV-2 by FDA under an Emergency  Use Authorization (EUA). This EUA will remain  in effect (meaning this test can be used) for the duration of the COVID-19 declaration under Section 564(b)(1) of the Act, 21 U.S.C.section 360bbb-3(b)(1), unless the authorization is terminated  or revoked sooner.       Influenza A by PCR NEGATIVE NEGATIVE Final   Influenza B by PCR NEGATIVE NEGATIVE Final    Comment: (NOTE) The Xpert Xpress SARS-CoV-2/FLU/RSV plus assay is intended as an aid in the diagnosis of influenza from Nasopharyngeal swab specimens and should not be used as a sole basis for treatment. Nasal washings and aspirates are unacceptable for Xpert Xpress SARS-CoV-2/FLU/RSV testing.  Fact Sheet for Patients: BloggerCourse.com  Fact Sheet for Healthcare Providers: SeriousBroker.it  This test is not yet approved or cleared by the [place]  FDA and has been authorized for detection and/or diagnosis of SARS-CoV-2 by FDA under an Emergency Use Authorization (EUA). This EUA will remain in effect (meaning this test can be used) for the duration of the COVID-19 declaration under Section 564(b)(1) of the Act, 21 U.S.C. section 360bbb-3(b)(1), unless the authorization is terminated or revoked.     Resp Syncytial Virus by PCR NEGATIVE NEGATIVE Final    Comment: (NOTE) Fact Sheet for Patients: BloggerCourse.com  Fact Sheet for Healthcare Providers: SeriousBroker.it  This test is not yet approved or cleared by the [place]  FDA and has been authorized for detection and/or diagnosis of SARS-CoV-2 by FDA under an Emergency Use Authorization (EUA). This EUA will remain in effect (meaning this test can be used) for the duration of the COVID-19 declaration under Section 564(b)(1) of the Act, 21 U.S.C. section 360bbb-3(b)(1), unless the authorization is terminated or revoked.  Performed at [facility], [address] [place], [place] [address]   Group A Strep by PCR [facility] Only)     Status: None   Collection Time: 12/19/23  4:21 PM   Specimen: Anterior Nasal Swab; Sterile Swab  Result Value Ref Range Status   Group A Strep by PCR NOT DETECTED NOT DETECTED Final    Comment: Performed at [facility], [address] [place], [place] [address]    Coagulation Studies: No results for input(s): LABPROT, INR in the last 72 hours.  Urinalysis: No results for input(s): COLORURINE, LABSPEC, PHURINE, GLUCOSEU, HGBUR, BILIRUBINUR, KETONESUR, PROTEINUR, UROBILINOGEN, NITRITE, LEUKOCYTESUR in the last 72 hours.  Invalid input(s): APPERANCEUR    Imaging: No results found.   Medications:     amitriptyline   25 mg Oral QHS   amLODipine   5 mg Oral Daily   atorvastatin   20 mg Oral Daily   calcium  acetate  1,334 mg Oral TID WC   fluticasone   2 spray Each Nare Daily   folic acid   1 mg Oral Daily   levothyroxine   125 mcg Oral Once per day on Monday Tuesday Wednesday Thursday Friday Saturday   levothyroxine   187.5 mcg Oral Q Sun   lipase/protease/amylase  12,000 Units Oral TID WC   loratadine   10 mg Oral Daily   midodrine   2.5 mg Oral Q M,W,F   multivitamin  1 tablet Oral  QHS   pantoprazole   40 mg Oral Daily   zinc  sulfate (50mg  elemental zinc )  220 mg Oral Daily   acetaminophen , albuterol , guaiFENesin -dextromethorphan , hydrOXYzine , traMADol   Assessment/ Plan:  Tonya Myers is a 58 y.o.  female past medical history of end-stage renal disease on hemodialysis Monday Wednesday Friday schedule, ANCA related glomerulonephritis, diet-controlled diabetes, orthostatic hypotension and thrombocytopenia and CLL now admitted with complaints on generalized weakness and inability to care for herself. She is now being treated with antibiotics for pneumonia.   [facility]/MWF/Rt AVF   Generalized weakness in the setting of ESRD on hemodialysis:  Receiving dialysis today, UF  0.5L Next treatment scheduled for Monday. Patient offered bed in [place], dialysis navigator will seek outpatient dialysis clinic near that area.   ANEMIA with chronic kidney disease: Hgb 7.9. Avoiding ESA due to CLL.   Secondary Hyperparathyroidism: with outpatient labs: PTH 568, phosphorus 5.1, calcium  9.0 on 11/28/23 monitor calcium /phosphorus during this admission. Currently prescribed calcitriol and calcium  acetate outpatient.    4. Hypertension with chronic kidney disease. Home regimen includes midodrine , currently held. Receiving hydrochlorothiazide  during this admission. Blood pressure 94/61 during dialysis.   5. 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 Most recent hemoglobin A1c is 5.6 on 07/29/23.   Diet controlled      LOS: 0 Ashaad Gaertner 8/29/202510:53 AM

## 2023-12-23 NOTE — TOC Progression Note (Signed)
 Transition of Care [facility]) - Progression Note    Patient Details  Name: Tonya Myers MRN: [id number] Date of Birth: 10-30-1965  [facility]) [name] Contact  Marinda Cooks, RN Phone Number: 12/23/2023, 12:45 PM  Clinical Narrative:    This CM spoke with admission liaison Lonell at [facility] regarding pt's bed referral and was updated bed offer is being extended and a bed will tentatively be available next wk . Ins Auth started and pending. [facility] point of contact Suzen Satchel coordinating  temporary Dialysis chair that works with facility  for pt prior to admission  SNF  . TOC will cont to follow pt's dc planning/ care coordination.                    Expected Discharge Plan and Services                                               Social Drivers of Health (SDOH) Interventions SDOH Screenings   Food Insecurity: No Food Insecurity (12/11/2023)  Housing: Low Risk  (12/11/2023)  Transportation Needs: Unmet Transportation Needs (12/11/2023)  Utilities: Not At Risk (12/11/2023)  Depression (PHQ2-9): Low Risk  (12/06/2023)  Financial Resource Strain: High Risk (07/06/2023)   Received from [facility]  Social Connections: Unknown (08/12/2023)  Tobacco Use: Low Risk  (12/16/2023)    Readmission Risk Interventions    12/24/2021   12:03 PM  Readmission Risk Prevention Plan  Transportation Screening Complete  Medication Review (RN Care Manager) Complete  PCP or Specialist appointment within 3-5 days of discharge Complete  HRI or Home Care Consult Complete  SW Recovery Care/Counseling Consult Complete  Palliative Care Screening Not Applicable  Skilled Nursing Facility Complete

## 2023-12-23 NOTE — Progress Notes (Signed)
   12/23/23 1147  Vitals  BP 103/63  Pulse Rate 96  Resp 18  Type of Weight Post-Dialysis  Oxygen Therapy  SpO2 97 %  O2 Device Nasal Cannula  Patient Activity (if Appropriate) In bed  Pulse Oximetry Type Continuous  Oximetry Probe Site Changed No  Post Treatment  Dialyzer Clearance Clear  Liters Processed 70.6  Fluid Removed (mL) 0 mL  Tolerated HD Treatment Yes  Post-Hemodialysis Comments BLOOD PRESSURE SHOWS IMPROVEMENT. HEMOSTASIS ACHIEVED.  AVG/AVF Arterial Site Held (minutes) 5 minutes  AVG/AVF Venous Site Held (minutes) 7 minutes

## 2023-12-23 NOTE — ED Provider Notes (Signed)
 Emergency Medicine Observation Re-evaluation Note  Tonya Myers is a 58 y.o. female, seen on rounds today.  Pt initially presented to the ED for complaints of Weakness Currently, the patient is resting comfortably in bed  Physical Exam  BP 103/63   Pulse 96   Temp 98.7 F (37.1 C) Comment: temperature rechecked  Resp 18   Ht 6' (1.829 m)   Wt 89.5 kg   SpO2 97%   BMI 26.76 kg/m  Physical Exam General: Well appearing Cardiac: Well perfused Lungs: No respiratory distress P  ED Course / MDM  EKG:   I have reviewed the labs performed to date as well as medications administered while in observation.  Recent changes in the last 24 hours include: Recived dialysis today, next treatment will be Monday   Plan  Current plan is for [place] per TOC: Patient possibly offered bed in [place], Rolland BRAVO, MD 12/23/23 1504

## 2023-12-23 NOTE — Progress Notes (Signed)
 OT Cancellation Note  Patient Details Name: Janai Maudlin MRN: [id number] DOB: [birth year]   Cancelled Treatment:    Reason Eval/Treat Not Completed: Patient at procedure or test/ unavailable. Chart reviewed. Pt currently at HD, unable for OT treatment. Will offer rehab services at another time/date, as pt is available and medically appropriate.  Suzen Hock 12/23/2023, 11:34 AM

## 2023-12-23 NOTE — ED Provider Notes (Signed)
-----------------------------------------   6:12 AM on 12/23/2023 -----------------------------------------   Blood pressure 125/75, pulse 68, temperature 99.3 F (37.4 C), temperature source Oral, resp. rate 19, height 1.829 m (6'), weight 88.3 kg, SpO2 96%.  The patient is calm and cooperative at this time.  There have been no acute events since the last update.  Awaiting disposition plan from [facility] team.   Gordan Huxley, MD 12/23/23 [phone]

## 2023-12-23 NOTE — Progress Notes (Signed)
 PT Cancellation Note  Patient Details Name: Tonya Myers MRN: [id number] DOB: [birth year]   Cancelled Treatment:     Pt in Dialysis for ~5 hours now. Will re-attempt next available date/time per POC.    Darice JAYSON Bohr 12/23/2023, 12:31 PM

## 2023-12-23 NOTE — Progress Notes (Signed)
 Was notified by [facility] pt will be d/c to SNF [facility] in [place], [place].  Submitted referral to [facility] to get pt trans to [facility] clinic for continuous dialysis outpt care.  Will continue to follow for HD needs.   Suzen Satchel Dialysis Navigator [phone]

## 2023-12-24 ENCOUNTER — Emergency Department

## 2023-12-24 ENCOUNTER — Other Ambulatory Visit: Payer: Self-pay | Admitting: Infectious Diseases

## 2023-12-24 DIAGNOSIS — N186 End stage renal disease: Secondary | ICD-10-CM | POA: Diagnosis not present

## 2023-12-24 DIAGNOSIS — R410 Disorientation, unspecified: Secondary | ICD-10-CM

## 2023-12-24 DIAGNOSIS — D649 Anemia, unspecified: Secondary | ICD-10-CM | POA: Diagnosis not present

## 2023-12-24 DIAGNOSIS — D696 Thrombocytopenia, unspecified: Secondary | ICD-10-CM | POA: Diagnosis not present

## 2023-12-24 DIAGNOSIS — D72829 Elevated white blood cell count, unspecified: Secondary | ICD-10-CM

## 2023-12-24 DIAGNOSIS — I7782 Antineutrophilic cytoplasmic antibody (ANCA) vasculitis: Secondary | ICD-10-CM | POA: Diagnosis not present

## 2023-12-24 DIAGNOSIS — R4182 Altered mental status, unspecified: Secondary | ICD-10-CM | POA: Diagnosis present

## 2023-12-24 LAB — COMPREHENSIVE METABOLIC PANEL WITH GFR
ALT: 26 U/L (ref 0–44)
AST: 118 U/L — ABNORMAL HIGH (ref 15–41)
Albumin: 2.6 g/dL — ABNORMAL LOW (ref 3.5–5.0)
Alkaline Phosphatase: 55 U/L (ref 38–126)
Anion gap: 14 (ref 5–15)
BUN: 43 mg/dL — ABNORMAL HIGH (ref 6–20)
CO2: 26 mmol/L (ref 22–32)
Calcium: 8.2 mg/dL — ABNORMAL LOW (ref 8.9–10.3)
Chloride: 95 mmol/L — ABNORMAL LOW (ref 98–111)
Creatinine, Ser: 6.61 mg/dL — ABNORMAL HIGH (ref 0.44–1.00)
GFR, Estimated: 7 mL/min — ABNORMAL LOW (ref >60)
Glucose, Bld: 99 mg/dL (ref 70–99)
Potassium: 4.5 mmol/L (ref 3.5–5.1)
Sodium: 135 mmol/L (ref 135–145)
Total Bilirubin: 1.2 mg/dL (ref 0.0–1.2)
Total Protein: 7.3 g/dL (ref 6.5–8.1)

## 2023-12-24 LAB — CBC WITH DIFFERENTIAL/PLATELET
Abs Immature Granulocytes: 0.04 K/uL (ref 0.00–0.07)
Basophils Absolute: 0 K/uL (ref 0.0–0.1)
Basophils Relative: 0 %
Eosinophils Absolute: 0.2 K/uL (ref 0.0–0.5)
Eosinophils Relative: 2 %
HCT: 29.5 % — ABNORMAL LOW (ref 36.0–46.0)
Hemoglobin: 9 g/dL — ABNORMAL LOW (ref 12.0–15.0)
Immature Granulocytes: 1 %
Lymphocytes Relative: 10 %
Lymphs Abs: 0.4 K/uL — ABNORMAL LOW (ref 0.7–4.0)
MCH: 28.2 pg (ref 26.0–34.0)
MCHC: 30.5 g/dL (ref 30.0–36.0)
MCV: 92.5 fL (ref 80.0–100.0)
Monocytes Absolute: 0.2 K/uL (ref 0.1–1.0)
Monocytes Relative: 3 %
Neutro Abs: 3.8 K/uL (ref 1.7–7.7)
Neutrophils Relative %: 84 %
Platelets: 53 K/uL — ABNORMAL LOW (ref 150–400)
RBC: 3.19 MIL/uL — ABNORMAL LOW (ref 3.87–5.11)
RDW: 18.7 % — ABNORMAL HIGH (ref 11.5–15.5)
WBC: 3.8 K/uL — ABNORMAL LOW (ref 4.0–10.5)
nRBC: 0.5 % — ABNORMAL HIGH (ref 0.0–0.2)

## 2023-12-24 LAB — URINALYSIS, ROUTINE W REFLEX MICROSCOPIC
Bilirubin Urine: NEGATIVE
Glucose, UA: NEGATIVE mg/dL
Ketones, ur: NEGATIVE mg/dL
Nitrite: NEGATIVE
Protein, ur: 100 mg/dL — AB
RBC / HPF: >50 RBC/hpf (ref 0–5)
Specific Gravity, Urine: 1.019 (ref 1.005–1.030)
WBC, UA: >50 WBC/hpf (ref 0–5)
pH: 7 (ref 5.0–8.0)

## 2023-12-24 LAB — RESP PANEL BY RT-PCR (RSV, FLU A&B, COVID)  RVPGX2
Influenza A by PCR: NEGATIVE
Influenza B by PCR: NEGATIVE
Resp Syncytial Virus by PCR: NEGATIVE
SARS Coronavirus 2 by RT PCR: NEGATIVE

## 2023-12-24 LAB — AMMONIA: Ammonia: 15 umol/L (ref 9–35)

## 2023-12-24 LAB — MRSA NEXT GEN BY PCR, NASAL: MRSA by PCR Next Gen: DETECTED — AB

## 2023-12-24 LAB — LACTIC ACID, PLASMA: Lactic Acid, Venous: 1.2 mmol/L (ref 0.5–1.9)

## 2023-12-24 LAB — CBG MONITORING, ED: Glucose-Capillary: 103 mg/dL — ABNORMAL HIGH (ref 70–99)

## 2023-12-24 LAB — CK: Total CK: 445 U/L — ABNORMAL HIGH (ref 38–234)

## 2023-12-24 LAB — BLOOD GAS, VENOUS: Patient temperature: 37

## 2023-12-24 LAB — PROTIME-INR
INR: 1.1 (ref 0.8–1.2)
Prothrombin Time: 14.9 s (ref 11.4–15.2)

## 2023-12-24 MED ORDER — SODIUM CHLORIDE 0.9 % IV SOLN
2.0000 g | INTRAVENOUS | Status: DC
  Administered 2023-12-24 – 2023-12-25 (×2): 2 g via INTRAVENOUS
  Filled 2023-12-24 (×2): qty 20

## 2023-12-24 MED ORDER — SODIUM CHLORIDE 0.9 % IV BOLUS
250.0000 mL | Freq: Once | INTRAVENOUS | Status: AC
  Administered 2023-12-24: 250 mL via INTRAVENOUS

## 2023-12-24 MED ORDER — POLYETHYLENE GLYCOL 3350 17 G PO PACK: 17.0000 g | PACK | Freq: Every day | ORAL | Status: DC | PRN

## 2023-12-24 MED ORDER — ONDANSETRON HCL 4 MG/2ML IJ SOLN: 4.0000 mg | Freq: Four times a day (QID) | INTRAMUSCULAR | Status: DC | PRN

## 2023-12-24 MED ORDER — SODIUM CHLORIDE 0.9 % BOLUS PEDS
250.0000 mL | Freq: Once | INTRAVENOUS | Status: AC
  Administered 2023-12-24: 250 mL via INTRAVENOUS

## 2023-12-24 MED ORDER — MIDODRINE HCL 5 MG PO TABS
2.5000 mg | ORAL_TABLET | ORAL | Status: AC
  Administered 2023-12-24: 2.5 mg via ORAL
  Filled 2023-12-24: qty 1

## 2023-12-24 MED ORDER — ONDANSETRON HCL 4 MG PO TABS: 4.0000 mg | ORAL_TABLET | Freq: Four times a day (QID) | ORAL | Status: DC | PRN

## 2023-12-24 MED ORDER — SODIUM CHLORIDE 0.9 % IV SOLN
500.0000 mg | INTRAVENOUS | Status: DC
  Administered 2023-12-24: 500 mg via INTRAVENOUS
  Filled 2023-12-24: qty 5

## 2023-12-24 MED ORDER — HEPARIN SODIUM (PORCINE) 5000 UNIT/ML IJ SOLN
5000.0000 [IU] | Freq: Three times a day (TID) | INTRAMUSCULAR | Status: DC
  Administered 2023-12-24 – 2023-12-31 (×15): 5000 [IU] via SUBCUTANEOUS
  Filled 2023-12-24 (×16): qty 1

## 2023-12-24 NOTE — ED Provider Notes (Signed)
 Emergency Medicine Observation Re-evaluation Note   BP 97/63 (BP Location: Left Arm)   Pulse 93   Temp 100 F (37.8 C) (Oral)   Resp 18   Ht 6' (1.829 m)   Wt 89.5 kg   SpO2 95%   BMI 26.76 kg/m    ED Course / MDM   No reported events during my shift at the time of this note.   Pt is awaiting dispo from consultants   Ginnie Shams MD    Shams Ginnie, MD 12/24/23 [id number]

## 2023-12-24 NOTE — ED Provider Notes (Addendum)
 Vitals:   12/24/23 1116 12/24/23 1203  BP: (!) 91/58 (!) 93/59  Pulse:  90  Resp:  16  Temp:  99.8 F (37.7 C)  SpO2:  100%     Patient recently treated for pneumonia.  Noted to have moderate hypotension, but no systolic pressure less than 90.  I spoke with the patient and there was concern also documented by nursing and told me by Darlyn that patient has been a little more confused over the last day.  In speaking to the patient she reports she just does not feel well.  She cannot describe exactly what.  She does have a cough.  She reports nothing hurts but she feels like she is getting sick.  Recently treated for pneumonia.  Low grade temp 99.8 now evident with hypotension.  Had hemodialysis yesterday.  On dialysis patient certainly at high risk for bacteremia or other infection.  Suspect unlikely to represent UTI given she is a dialysis patient, she does not endorse any acute urinary symptoms.  She reports stools been slightly loose  She is oriented to self but not well-oriented to situation, somewhat slow to respond or takes her a few seconds to process but recognizes who she is and that she is in a hospital.  She cannot give me good detail and it appears that when she presented she was able to give good detail history and was more oriented at that time.  Workup including CT head x-ray, CBC metabolic panel lactic acid ammonia etc. are all pending.     Dicky Anes, MD 12/24/23 1256  I was unable to place the order for C. difficile testing as the patient has been in the hospital more than 4 days.  I have sent message to Dr.Ravishankar to request if C. difficile testing could please be ordered   Dicky Anes, MD 12/24/23 1259

## 2023-12-24 NOTE — ED Notes (Signed)
 Scant liquid stool found in brief - unable to obtain for sample. Pt changed into new brief. In&out performed w/ NT. About 20 mL obtained and sent for sample.

## 2023-12-24 NOTE — Consult Note (Addendum)
 NAME: Tonya Myers  DOB: 1965/04/28  MRN: [id number]  Date/Time: 12/24/2023 8:17 PM  REQUESTING PROVIDER: Dr. Roann Subjective:  REASON FOR CONSULT: Sepsis Patient is known to me from previous admissions in 2022 She is a limited historian Chart reviewed in detail Reviewed care everywhere and records from [facility] and [place] as well. Tonya Myers is a 58 y.o. 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, extensive thoracic space infection underwent laminectomy of T3-T4, T5 T7, T9-T10 and evacuation of abscess and treated with appropriate Antibiotics for more than 6 weeks,, diagnosed with PE and correlated vasculitis and AKI in December 13 2198 received high-dose steroids and rituximab  and later had to go on dialysis, seizure disorder recurrent hospitalizations the entire year of 2023 at [facility] for encephalopathy, seizures and had lumbar puncture with increasing protein Never been on treatment for ANCA vasculitis or a positive ANA with  dsDNA  Had seen rheumatology in the past at [facility] but not as outpatient, has been followed by neurology as outpatient for the seizures and peripheral neuropathy Was recently in [facility] between 12/11/2023 until 12/13/2023 for encephalopathy and treatment of pneumonia and there was a plan to discharge her to skilled nursing facility but she went home.  But she came back to the ED on 12/15/2023 as she could not manage at home by herself. She stayed in the ED from 12/15/2023 until today waiting for a SNF placement.  Today she was sent to the hospitalist site for admission because she had low BP and suspect sepsis was question This morning she had altered mental status  12/24/23 06:14  BP 103/61  Temp 98.8 F (37.1 C)  Pulse Rate 110 !  Resp 17  SpO2 95 %    Latest Reference Range & Units 12/24/23 13:41  WBC 4.0 - 10.5 K/uL 3.8 (L)  Hemoglobin 12.0 - 15.0 g/dL 9.0 (L)   HCT 63.9 - 53.9 % 29.5 (L)  Platelets 150 - 400 K/uL 53 (L) [1]  Creatinine 0.44 - 1.00 mg/dL 3.38 (H)  She had low-grade fever  She has been started on ceftriaxone , vancomycin  and azithromycin  as the chest x-ray had shown right lower lobe infiltrate Blood cultures have been sent before that I am asked to see the patient for sepsis To me patient is awake and alert She says she has no cough or shortness of breath She has no abdominal pain She has pain in her legs She is unable to manage on her own She lives by herself She keeps saying something is not right with her    Past Medical History:  Diagnosis Date   Acute hemorrhoid 03/11/2015   Anxiety    Arthritis    Asthma    Cancer (HCC)    Chronic back pain    CLL (chronic lymphocytic leukemia) (HCC)    Depression    Diabetes mellitus without complication (HCC)    Genital herpes    type 2   Hypertension    Hypothyroidism    Renal disorder    Vertigo     Past Surgical History:  Procedure Laterality Date   ABDOMINAL HYSTERECTOMY     BILATERAL SALPINGOOPHORECTOMY  2009   BREAST BIOPSY Right 05/17/2016   FIBROADENOMATOUS CHANGE AND SCLEROSING ADENOSIS WITH   COLONOSCOPY WITH PROPOFOL  N/A 06/16/2015   Procedure: COLONOSCOPY WITH PROPOFOL ;  Surgeon: Donnice Vaughn Manes, MD;  Location: [facility] ENDOSCOPY;  Service: Endoscopy;  Laterality: N/A;  DIALYSIS/PERMA CATHETER INSERTION N/A 12/17/2020   Procedure: DIALYSIS/PERMA CATHETER INSERTION;  Surgeon: Marea Selinda RAMAN, MD;  Location: [facility] INVASIVE CV LAB;  Service: Cardiovascular;  Laterality: N/A;   LAPAROSCOPIC SUPRACERVICAL HYSTERECTOMY  2009   due to leio   OOPHORECTOMY     TEMPORARY DIALYSIS CATHETER N/A 12/10/2020   Procedure: TEMPORARY DIALYSIS CATHETER;  Surgeon: Jama Cordella MATSU, MD;  Location: [facility] INVASIVE CV LAB;  Service: Cardiovascular;  Laterality: N/A;   THORACIC LAMINECTOMY FOR EPIDURAL ABSCESS Bilateral 06/17/2020   Procedure: THORACIC LAMINECTOMY FOR EPIDURAL  ABSCESS;  Surgeon: Bluford Standing, MD;  Location: [facility] ORS;  Service: Neurosurgery;  Laterality: Bilateral;    Social History   Socioeconomic History   Marital status: Married    Spouse name: Not on file   Number of children: Not on file   Years of education: Not on file   Highest education level: Not on file  Occupational History   Not on file  Tobacco Use   Smoking status: Never   Smokeless tobacco: Never  Vaping Use   Vaping status: Never Used  Substance and Sexual Activity   Alcohol use: Not Currently    Alcohol/week: 1.0 standard drink of alcohol    Types: 1 Cans of beer per week   Drug use: No   Sexual activity: Not Currently    Birth control/protection: None  Other Topics Concern   Not on file  Social History Narrative   Not on file   Social Drivers of Health   Financial Resource Strain: High Risk (07/06/2023)   Received from [facility]   Overall Financial Resource Strain (CARDIA)    Difficulty of Paying Living Expenses: Hard  Food Insecurity: No Food Insecurity (12/11/2023)   Hunger Vital Sign    Worried About Running Out of Food in the Last Year: Never true    Ran Out of Food in the Last Year: Never true  Transportation Needs: Unmet Transportation Needs (12/11/2023)   PRAPARE - Administrator, Civil Service (Medical): Yes    Lack of Transportation (Non-Medical): Yes  Physical Activity: Not on file  Stress: Not on file  Social Connections: Unknown (08/12/2023)   Social Connection and Isolation Panel    Frequency of Communication with Friends and Family: More than three times a week    Frequency of Social Gatherings with Friends and Family: More than three times a week    Attends Religious Services: More than 4 times per year    Active Member of [employer] or Organizations: Patient declined    Attends Banker Meetings: Patient declined    Marital Status: Not on file  Intimate Partner Violence: Not At Risk (12/11/2023)    Humiliation, Afraid, Rape, and Kick questionnaire    Fear of Current or Ex-Partner: No    Emotionally Abused: No    Physically Abused: No    Sexually Abused: No    Family History  Problem Relation Age of Onset   Cancer Paternal Aunt    Breast cancer Maternal Aunt 38   Allergies  Allergen Reactions   Amoxapine     Other Reaction(s): Unknown   Latex Other (See Comments)   Ceftazidime     Other reaction(s): Confusion, Hallucination, Unknown Encephalopathy - improved after dialysis/some concern for cephalosporin neurotoxicity Tolerated without confusion 06/2021. Encephalopathy - improved after dialysis/some concern for cephalosporin neurotoxicity Tolerated without confusion 06/2021.    Iodinated Contrast Media Hives and Rash    Other Reaction(s): Hives   Penicillins  Other Reaction(s): Unknown   I? Current Facility-Administered Medications  Medication Dose Route Frequency Provider Last Rate Last Admin   acetaminophen  (TYLENOL ) tablet 650 mg  650 mg Oral Q6H PRN Gordan Huxley, MD   650 mg at 12/24/23 1517   albuterol  (PROVENTIL ) (2.5 MG/3ML) 0.083% nebulizer solution 2.5 mg  2.5 mg Nebulization Q6H PRN Dail Rankin RAMAN, RPH       atorvastatin  (LIPITOR) tablet 20 mg  20 mg Oral Daily Gordan Huxley, MD   20 mg at 12/24/23 9066   azithromycin  (ZITHROMAX ) 500 mg in sodium chloride  0.9 % 250 mL IVPB  500 mg Intravenous Q24H Paudel, Keshab, MD 250 mL/hr at 12/24/23 1755 500 mg at 12/24/23 1755   calcium  acetate (PHOSLO ) capsule 1,334 mg  1,334 mg Oral TID WC Gordan Huxley, MD   1,334 mg at 12/24/23 1158   cefTRIAXone  (ROCEPHIN ) 2 g in sodium chloride  0.9 % 100 mL IVPB  2 g Intravenous Q24H Roann Gouty, MD 200 mL/hr at 12/24/23 1721 2 g at 12/24/23 1721   fluticasone  (FLONASE ) 50 MCG/ACT nasal spray 2 spray  2 spray Each Nare Daily Gordan Huxley, MD       folic acid  (FOLVITE ) tablet 1 mg  1 mg Oral Daily Forbach, Cory, MD   1 mg at 12/24/23 9066   heparin  injection 5,000 Units  5,000  Units Subcutaneous Q8H Paudel, Gouty, MD       hydrOXYzine  (ATARAX ) tablet 25 mg  25 mg Oral TID PRN Gordan Huxley, MD   25 mg at 12/19/23 2044   levothyroxine  (SYNTHROID ) tablet 187.5 mcg  187.5 mcg Oral Q Austin Nada Adriana JONETTA, RPH   187.5 mcg at 12/18/23 9146   lipase/protease/amylase (CREON ) capsule 12,000 Units  12,000 Units Oral TID WC Gordan Huxley, MD   12,000 Units at 12/24/23 1158   loratadine  (CLARITIN ) tablet 10 mg  10 mg Oral Daily Forbach, Cory, MD   10 mg at 12/24/23 0933   melatonin tablet 5 mg  5 mg Oral QHS Chappell, Alex B, RPH   5 mg at 12/23/23 2137   midodrine  (PROAMATINE ) tablet 2.5 mg  2.5 mg Oral Q M,W,F Niels Kayla FALCON, RPH   2.5 mg at 12/23/23 1134   multivitamin (RENA-VIT) tablet 1 tablet  1 tablet Oral QHS Gordan Huxley, MD   1 tablet at 12/23/23 2137   ondansetron  (ZOFRAN ) tablet 4 mg  4 mg Oral Q6H PRN Paudel, Keshab, MD       Or   ondansetron  (ZOFRAN ) injection 4 mg  4 mg Intravenous Q6H PRN Paudel, Keshab, MD       pantoprazole  (PROTONIX ) EC tablet 40 mg  40 mg Oral Daily Forbach, Cory, MD   40 mg at 12/24/23 9066   polyethylene glycol (MIRALAX  / GLYCOLAX ) packet 17 g  17 g Oral Daily PRN Paudel, Keshab, MD       zinc  sulfate (50mg  elemental zinc ) capsule 220 mg  220 mg Oral Daily Gordan Huxley, MD   220 mg at 12/24/23 9065   Facility-Administered Medications Ordered in Other Encounters  Medication Dose Route Frequency Provider Last Rate Last Admin   capsaicin  topical system 8 % patch 4 patch  4 patch Topical Once Lateef, Bilal, MD         Abtx:  Anti-infectives (From admission, onward)    Start     Dose/Rate Route Frequency Ordered Stop   12/24/23 1600  cefTRIAXone  (ROCEPHIN ) 2 g in sodium chloride  0.9 % 100 mL IVPB  2 g 200 mL/hr over 30 Minutes Intravenous Every 24 hours 12/24/23 1558 12/29/23 1559   12/24/23 1600  azithromycin  (ZITHROMAX ) 500 mg in sodium chloride  0.9 % 250 mL IVPB        500 mg 250 mL/hr over 60 Minutes Intravenous Every 24  hours 12/24/23 1558 12/29/23 1559   12/16/23 1200  cefUROXime  (CEFTIN ) tablet 500 mg        500 mg Oral Every M-W-F (Hemodialysis) 12/16/23 0610 12/19/23 1411   12/16/23 1000  azithromycin  (ZITHROMAX ) tablet 500 mg        500 mg Oral Daily 12/16/23 0610 12/17/23 0945       REVIEW OF SYSTEMS:  Const: negative fever, negative chills, negative weight loss Eyes: negative diplopia or visual changes, negative eye pain ENT: negative coryza, negative sore throat Resp: negative cough, hemoptysis, dyspnea Cards: negative for chest pain, palpitations, lower extremity edema GU: negative for frequency, dysuria and hematuria GI: Negative for abdominal pain, diarrhea, bleeding, constipation Skin: negative for rash and pruritus Heme: negative for easy bruising and gum/nose bleeding MS: weakness has had falls Neurolo: Falls Fluctuating alertness Psych: anxiety, depression  Endocrine: negative for thyroid , diabetes Allergy/Immunology-ceftazidime confusion hallucination Objective:  VITALS:  BP (!) 137/111 (BP Location: Left Arm)   Pulse 66   Temp 98.6 F (37 C) (Oral)   Resp 20   Ht 6' (1.829 m)   Wt 89.5 kg   SpO2 95%   BMI 26.76 kg/m   PHYSICAL EXAM:  General: Alert, cooperative, no distress, oriented in place, person, year, month  Head: Normocephalic, without obvious abnormality, atraumatic. Eyes: Conjunctivae clear, anicteric sclerae. Pupils are equal ENT Nares normal. No drainage or sinus tenderness. Lips, mucosa, and tongue normal.  Tongue coated  neck: No neck stiffness or rigidity No carotid bruit and no JVD. Back: No CVA tenderness. Lungs: Bilateral air entry  heart: Regular rate and rhythm, no murmur, rub or gallop. Abdomen: Soft, non-tender,not distended. Bowel sounds normal. No masses Extremities: Right upper arm AV fistula Skin: No rashes or lesions. Or bruising Lymph: Cervical, supraclavicular normal. Neurologic: Moves all extremities Pertinent Labs Lab  Results CBC    Component Value Date/Time   WBC 3.8 (L) 12/24/2023 1341   RBC 3.19 (L) 12/24/2023 1341   HGB 9.0 (L) 12/24/2023 1341   HGB 9.6 (L) 11/04/2023 1015   HCT 29.5 (L) 12/24/2023 1341   PLT 53 (L) 12/24/2023 1341   PLT 66 (L) 11/04/2023 1015   MCV 92.5 12/24/2023 1341   MCH 28.2 12/24/2023 1341   MCHC 30.5 12/24/2023 1341   RDW 18.7 (H) 12/24/2023 1341   LYMPHSABS 0.4 (L) 12/24/2023 1341   MONOABS 0.2 12/24/2023 1341   EOSABS 0.2 12/24/2023 1341   BASOSABS 0.0 12/24/2023 1341       Latest Ref Rng & Units 12/24/2023    1:41 PM 12/23/2023    8:18 AM 12/21/2023    8:25 AM  CMP  Glucose 70 - 99 mg/dL 99  883  837   BUN 6 - 20 mg/dL 43  58  60   Creatinine 0.44 - 1.00 mg/dL 3.38  1.32  1.49   Sodium 135 - 145 mmol/L 135  138  136   Potassium 3.5 - 5.1 mmol/L 4.5  4.9  5.1   Chloride 98 - 111 mmol/L 95  94  94   CO2 22 - 32 mmol/L 26  25  26    Calcium  8.9 - 10.3 mg/dL 8.2  8.3  8.2   Total  Protein 6.5 - 8.1 g/dL 7.3     Total Bilirubin 0.0 - 1.2 mg/dL 1.2     Alkaline Phos 38 - 126 U/L 55     AST 15 - 41 U/L 118     ALT 0 - 44 U/L 26       Latest Reference Range & Units 12/03/20 11:40 12/11/23 09:46  SEE BELOW  Comment Comment  Anti Nuclear Antibody (ANA) Negative  Positive ! Positive !  Anti JO-1 0.0 - 0.9 AI <0.2   CENTROMERE AB SCREEN 0.0 - 0.9 AI <0.2   CCP Antibodies IgG/IgA 0 - 19 units  10  ds DNA Ab 0 - 9 IU/mL 11 (H) >300 (H)  GBM Ab 0.0 - 0.9 units <0.2   RA Latex Turbid. <14.0 IU/mL  19.7 (H)  Cytoplasmic (C-ANCA) Neg:<1:20 titer <1:20   P-ANCA Neg:<1:20 titer 1:160 (H)   Atypical P-ANCA titer Neg:<1:20 titer <1:20   ENA SM Ab Ser-aCnc 0.0 - 0.9 AI >8.0 (H) 3.6 (H)  Chromatin Ab SerPl-aCnc 0.0 - 0.9 AI >8.0 (H)      Microbiology: Recent Results (from the past 240 hours)  Resp panel by RT-PCR (RSV, Flu A&B, Covid) Anterior Nasal Swab     Status: None   Collection Time: 12/19/23  4:21 PM   Specimen: Anterior Nasal Swab  Result Value Ref Range  Status   SARS Coronavirus 2 by RT PCR NEGATIVE NEGATIVE Final    Comment: (NOTE) SARS-CoV-2 target nucleic acids are NOT DETECTED.  The SARS-CoV-2 RNA is generally detectable in upper respiratory specimens during the acute phase of infection. The lowest concentration of SARS-CoV-2 viral copies this assay can detect is 138 copies/mL. A negative result does not preclude SARS-Cov-2 infection and should not be used as the sole basis for treatment or other patient management decisions. A negative result may occur with  improper specimen collection/handling, submission of specimen other than nasopharyngeal swab, presence of viral mutation(s) within the areas targeted by this assay, and inadequate number of viral copies(<138 copies/mL). A negative result must be combined with clinical observations, patient history, and epidemiological information. The expected result is Negative.  Fact Sheet for Patients:  BloggerCourse.com  Fact Sheet for Healthcare Providers:  SeriousBroker.it  This test is no t yet approved or cleared by the [place]  FDA and  has been authorized for detection and/or diagnosis of SARS-CoV-2 by FDA under an Emergency Use Authorization (EUA). This EUA will remain  in effect (meaning this test can be used) for the duration of the COVID-19 declaration under Section 564(b)(1) of the Act, 21 U.S.C.section 360bbb-3(b)(1), unless the authorization is terminated  or revoked sooner.       Influenza A by PCR NEGATIVE NEGATIVE Final   Influenza B by PCR NEGATIVE NEGATIVE Final    Comment: (NOTE) The Xpert Xpress SARS-CoV-2/FLU/RSV plus assay is intended as an aid in the diagnosis of influenza from Nasopharyngeal swab specimens and should not be used as a sole basis for treatment. Nasal washings and aspirates are unacceptable for Xpert Xpress SARS-CoV-2/FLU/RSV testing.  Fact Sheet for  Patients: BloggerCourse.com  Fact Sheet for Healthcare Providers: SeriousBroker.it  This test is not yet approved or cleared by the [place]  FDA and has been authorized for detection and/or diagnosis of SARS-CoV-2 by FDA under an Emergency Use Authorization (EUA). This EUA will remain in effect (meaning this test can be used) for the duration of the COVID-19 declaration under Section 564(b)(1) of the Act, 21 U.S.C. section 360bbb-3(b)(1), unless  the authorization is terminated or revoked.     Resp Syncytial Virus by PCR NEGATIVE NEGATIVE Final    Comment: (NOTE) Fact Sheet for Patients: BloggerCourse.com  Fact Sheet for Healthcare Providers: SeriousBroker.it  This test is not yet approved or cleared by the [place]  FDA and has been authorized for detection and/or diagnosis of SARS-CoV-2 by FDA under an Emergency Use Authorization (EUA). This EUA will remain in effect (meaning this test can be used) for the duration of the COVID-19 declaration under Section 564(b)(1) of the Act, 21 U.S.C. section 360bbb-3(b)(1), unless the authorization is terminated or revoked.  Performed at [facility], [address] [place], [place] [address]   Group A Strep by PCR [facility] Only)     Status: None   Collection Time: 12/19/23  4:21 PM   Specimen: Anterior Nasal Swab; Sterile Swab  Result Value Ref Range Status   Group A Strep by PCR NOT DETECTED NOT DETECTED Final    Comment: Performed at [facility], [address], [place], [place] [address]  Resp panel by RT-PCR (RSV, Flu A&B, Covid) Anterior Nasal Swab     Status: None   Collection Time: 12/24/23  1:39 PM   Specimen: Anterior Nasal Swab  Result Value Ref Range Status   SARS Coronavirus 2 by RT PCR NEGATIVE NEGATIVE Final    Comment: (NOTE) SARS-CoV-2 target nucleic acids are NOT DETECTED.  The SARS-CoV-2  RNA is generally detectable in upper respiratory specimens during the acute phase of infection. The lowest concentration of SARS-CoV-2 viral copies this assay can detect is 138 copies/mL. A negative result does not preclude SARS-Cov-2 infection and should not be used as the sole basis for treatment or other patient management decisions. A negative result may occur with  improper specimen collection/handling, submission of specimen other than nasopharyngeal swab, presence of viral mutation(s) within the areas targeted by this assay, and inadequate number of viral copies(<138 copies/mL). A negative result must be combined with clinical observations, patient history, and epidemiological information. The expected result is Negative.  Fact Sheet for Patients:  BloggerCourse.com  Fact Sheet for Healthcare Providers:  SeriousBroker.it  This test is no t yet approved or cleared by the [place]  FDA and  has been authorized for detection and/or diagnosis of SARS-CoV-2 by FDA under an Emergency Use Authorization (EUA). This EUA will remain  in effect (meaning this test can be used) for the duration of the COVID-19 declaration under Section 564(b)(1) of the Act, 21 U.S.C.section 360bbb-3(b)(1), unless the authorization is terminated  or revoked sooner.       Influenza A by PCR NEGATIVE NEGATIVE Final   Influenza B by PCR NEGATIVE NEGATIVE Final    Comment: (NOTE) The Xpert Xpress SARS-CoV-2/FLU/RSV plus assay is intended as an aid in the diagnosis of influenza from Nasopharyngeal swab specimens and should not be used as a sole basis for treatment. Nasal washings and aspirates are unacceptable for Xpert Xpress SARS-CoV-2/FLU/RSV testing.  Fact Sheet for Patients: BloggerCourse.com  Fact Sheet for Healthcare Providers: SeriousBroker.it  This test is not yet approved or cleared by the  [place]  FDA and has been authorized for detection and/or diagnosis of SARS-CoV-2 by FDA under an Emergency Use Authorization (EUA). This EUA will remain in effect (meaning this test can be used) for the duration of the COVID-19 declaration under Section 564(b)(1) of the Act, 21 U.S.C. section 360bbb-3(b)(1), unless the authorization is terminated or revoked.     Resp Syncytial Virus by PCR NEGATIVE  NEGATIVE Final    Comment: (NOTE) Fact Sheet for Patients: BloggerCourse.com  Fact Sheet for Healthcare Providers: SeriousBroker.it  This test is not yet approved or cleared by the [place]  FDA and has been authorized for detection and/or diagnosis of SARS-CoV-2 by FDA under an Emergency Use Authorization (EUA). This EUA will remain in effect (meaning this test can be used) for the duration of the COVID-19 declaration under Section 564(b)(1) of the Act, 21 U.S.C. section 360bbb-3(b)(1), unless the authorization is terminated or revoked.  Performed at [facility], [address] [place], [place] [address]   MRSA Next Gen by PCR, Nasal     Status: Abnormal   Collection Time: 12/24/23  5:49 PM   Specimen: Nasal Mucosa; Nasal Swab  Result Value Ref Range Status   MRSA by PCR Next Gen DETECTED (A) NOT DETECTED Final    Comment: RESULT CALLED TO, READ BACK BY AND VERIFIED WITH: AMY MCCRAY RN 12/24/23 1937 KG (NOTE) The GeneXpert MRSA Assay (FDA approved for NASAL specimens only), is one component of a comprehensive MRSA colonization surveillance program. It is not intended to diagnose MRSA infection nor to guide or monitor treatment for MRSA infections. Test performance is not FDA approved in patients less than 32 years old. Performed at [facility], [address] [place], [place] [address]     ETT       IMAGING RESULTS: Chest x-ray Query small infiltrate in the right lower lobe  I have  personally reviewed the films ? Impression/Recommendation ? Altered mental status--when I examined her this evening she was awake and alert  Encephalopathy patient has had fluctuating altered mental status for the past 3 years she is alert at 1 time and then more sleepy at other times She has had multiple lumbar punctures other than February 2022 when she had pneumococcal meningitis she has not had any evidence of pleocytosis in 4 different lumbar puncture was done after that She has had some discordant increase in protein.  She had been followed by neurology in the past.  There was a concern at one time for lupus cerebritis but it was inconclusive and no treatment was given On 12/11/2023 or ANA is again positive and double-stranded DNA is more than 300 which is extremely high compared to 11 in 2022.  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  History of pneumococcal meningitis and thoracic spine infection in 2022 There is no evidence of meningitis now clinically  Thromocytopenia chronic Anemia Leucopenia ( intermittent)   history of CLL  Need to discuss with rheumatologist regarding starting  treatment for autoimmune condition Also discuss with neurology regarding the  possibility of her having lupus cerebritis and  whether that is the cause of her seizures  Social issues.  Patient lives on her own and she is unable to cope on her own.    This consult involved complex antimicrobial management  I have personally spent  75---minutes involved in face-to-face and non-face-to-face activities for this patient on the day of the visit. Professional time spent includes the following activities: Preparing to see the patient (review of tests), Obtaining and/or reviewing separately obtained history (admission/discharge record), Performing a medically appropriate examination and/or evaluation , Ordering medications/tests/procedures, referring and communicating with other health care professionals, Documenting clinical information in the EMR, Independently interpreting results (not separately reported), Communicating results to the patient/family/caregiver, Counseling and educating the patient/family/caregiver and Care coordination (not separately reported).    ________________________________________________ Discussed with patient,and  requesting provider Note:  This document was prepared using Dragon voice recognition software and may include unintentional dictation errors.

## 2023-12-24 NOTE — ED Notes (Addendum)
 Pt family at bedside, family asking to not give pt opoid's as it makes her very loopy and hard to understand.

## 2023-12-24 NOTE — H&P (Signed)
 History and Physical    Tonya Myers FMW:[id number] DOB: [birth year] DOA: 12/16/2023  DOS: the patient was seen and examined on 12/16/2023  PCP: Valora Agent, MD   Patient coming from: Home  I have personally briefly reviewed patient's old medical records in [facility]  Chief Complaint: Acute change in mental status  HPI: Tonya Myers is a pleasant 58 y.o. female with medical history significant for ESRD on hemodialysis, CLL in remission, thrombocytopenia, history of strep pneumonia meningitis, s/p multilevel laminectomy, HLD, HTN, DM, depression, anxiety, hypothyroidism, recent treatment of pneumonia and discharged home on 12/13/2023 and came to emergency room on 12/15/2023, who was found to have acute change in mental status today and hospitalist service was consulted for admission.  On 12/15/2023 patient came back from home concerning for generalized pain and weakness not able to function at home.  That time patient was seen by hospitalist in the ED and advised that patient does not need admission as there is no acute issues.  Patient's vital signs at the time was temperature 98.1, respiration 16 heart rate 86, blood pressure 113/90, SpO2 98% on room air. Today, went ED physician was rounding this patient, patient had a low-grade temperature, hypotension and acute change in mental status.  Nephrology seen the patient and advised for starting 250 cc bolus of normal saline, small dose of midodrine  for hypotension, chest x-ray reported by radiologist for possible infiltrate.  Etiology of low-grade fevers uncertain but history of recent pneumonia, hemodialysis, bacteremia certainly a consideration.  Hospitalist service was consulted for evaluation for admission for acute change in mental status and possible concern for sepsis.    Review of Systems:  ROS  All other systems negative except as noted in the HPI.  Past Medical History:  Diagnosis Date   Acute hemorrhoid  03/11/2015   Anxiety    Arthritis    Asthma    Cancer (HCC)    Chronic back pain    CLL (chronic lymphocytic leukemia) (HCC)    Depression    Diabetes mellitus without complication (HCC)    Genital herpes    type 2   Hypertension    Hypothyroidism    Renal disorder    Vertigo     Past Surgical History:  Procedure Laterality Date   ABDOMINAL HYSTERECTOMY     BILATERAL SALPINGOOPHORECTOMY  2009   BREAST BIOPSY Right 05/17/2016   FIBROADENOMATOUS CHANGE AND SCLEROSING ADENOSIS WITH   COLONOSCOPY WITH PROPOFOL  N/A 06/16/2015   Procedure: COLONOSCOPY WITH PROPOFOL ;  Surgeon: Donnice Vaughn Manes, MD;  Location: [facility] ENDOSCOPY;  Service: Endoscopy;  Laterality: N/A;   DIALYSIS/PERMA CATHETER INSERTION N/A 12/17/2020   Procedure: DIALYSIS/PERMA CATHETER INSERTION;  Surgeon: Marea Selinda RAMAN, MD;  Location: [facility] INVASIVE CV LAB;  Service: Cardiovascular;  Laterality: N/A;   LAPAROSCOPIC SUPRACERVICAL HYSTERECTOMY  2009   due to leio   OOPHORECTOMY     TEMPORARY DIALYSIS CATHETER N/A 12/10/2020   Procedure: TEMPORARY DIALYSIS CATHETER;  Surgeon: Jama Cordella MATSU, MD;  Location: [facility] INVASIVE CV LAB;  Service: Cardiovascular;  Laterality: N/A;   THORACIC LAMINECTOMY FOR EPIDURAL ABSCESS Bilateral 06/17/2020   Procedure: THORACIC LAMINECTOMY FOR EPIDURAL ABSCESS;  Surgeon: Bluford Standing, MD;  Location: [facility] ORS;  Service: Neurosurgery;  Laterality: Bilateral;     reports that she has never smoked. She has never used smokeless tobacco. She reports that she does not currently use alcohol after a past usage of about 1.0 standard drink of alcohol per week. She reports that she  does not use drugs.  Allergies  Allergen Reactions   Amoxapine     Other Reaction(s): Unknown   Latex Other (See Comments)   Ceftazidime     Other reaction(s): Confusion, Hallucination, Unknown Encephalopathy - improved after dialysis/some concern for cephalosporin neurotoxicity Tolerated without confusion  06/2021. Encephalopathy - improved after dialysis/some concern for cephalosporin neurotoxicity Tolerated without confusion 06/2021.    Iodinated Contrast Media Hives and Rash    Other Reaction(s): Hives   Penicillins     Other Reaction(s): Unknown    Family History  Problem Relation Age of Onset   Cancer Paternal Aunt    Breast cancer Maternal Aunt 41    Prior to Admission medications   Medication Sig Start Date End Date Taking? Authorizing Provider  acetaminophen  (TYLENOL ) 325 MG tablet Take 2 tablets (650 mg total) by mouth every 6 (six) hours as needed for mild pain (or Fever >/= 101). 07/30/20   Angiulli, Toribio PARAS, PA-C  albuterol  (VENTOLIN  HFA) 108 (90 Base) MCG/ACT inhaler Inhale 2 puffs into the lungs every 6 (six) hours as needed for wheezing or shortness of breath. 10/18/21   [provider]  amitriptyline  (ELAVIL ) 25 MG tablet Take 1 tablet (25 mg total) by mouth at bedtime. 12/06/23   Marcelino Nurse, MD  amLODipine  (NORVASC ) 5 MG tablet Take 1 tablet (5 mg total) by mouth daily. 12/13/23 12/12/24  Patel, Sona, MD  atorvastatin  (LIPITOR) 20 MG tablet Take 20 mg by mouth daily. 10/18/21   [provider]  Blood Glucose Monitoring Suppl DEVI 1 each by Does not apply route 3 (three) times daily. May dispense any manufacturer covered by patient's insurance. 08/19/23   Von Bellis, MD  calcium  acetate (PHOSLO ) 667 MG capsule Take 1,334 mg by mouth 3 (three) times daily with meals. Patient not taking: Reported on 12/10/2023 05/11/23 05/10/24  [provider]  cetirizine (ZYRTEC) 10 MG tablet Take 1 tablet by mouth daily. 07/07/23   [provider]  fluticasone  (FLONASE ) 50 MCG/ACT nasal spray Place 2 sprays into both nostrils daily.    [provider]  folic acid  (FOLVITE ) 1 MG tablet Take 1 tablet by mouth daily. 10/18/21   [provider]  Glucose Blood (BLOOD GLUCOSE TEST STRIPS) STRP 1 each by Does not apply route 3 (three) times daily. Use  as directed to check blood sugar. May dispense any manufacturer covered by patient's insurance and fits patient's device. 08/19/23   Von Bellis, MD  guaiFENesin -dextromethorphan  (ROBITUSSIN DM) 100-10 MG/5ML syrup Take 5 mLs by mouth every 4 (four) hours as needed for cough. 12/13/23   Patel, Sona, MD  hydrOXYzine  (ATARAX ) 25 MG tablet Take 1 tablet (25 mg total) by mouth 3 (three) times daily as needed for itching. 08/19/23   Von Bellis, MD  Lancet Device MISC 1 each by Does not apply route 3 (three) times daily. May dispense any manufacturer covered by patient's insurance. 08/19/23   Von Bellis, MD  Lancets MISC 1 each by Does not apply route 3 (three) times daily. Use as directed to check blood sugar. May dispense any manufacturer covered by patient's insurance and fits patient's device. 08/19/23   Von Bellis, MD  levothyroxine  (SYNTHROID ) 125 MCG tablet Take 125 mcg by mouth daily. Take 1 tablet daily except Sunday take 1 and 1/2 tablet (187.5 mg). 12/08/21   [provider]  lipase/protease/amylase (CREON ) 12000-38000 units CPEP capsule Take 1 capsule by mouth 3 (three) times daily with meals. 09/06/23   [provider]  midodrine  (PROAMATINE ) 2.5 MG tablet Take 2.5 mg by mouth as directed. Only on dialysis days. (Monday Wednesday and Friday) 11/24/23   [provider]  multivitamin (RENA-VIT) TABS tablet Take 1 tablet by mouth at bedtime. 12/22/21   Jhonny Calvin NOVAK, MD  ondansetron  (ZOFRAN -ODT) 8 MG disintegrating tablet Take 1 tablet (8 mg total) by mouth every 8 (eight) hours as needed for nausea or vomiting. 11/04/23   Jacobo Evalene PARAS, MD  pantoprazole  (PROTONIX ) 40 MG tablet Take 1 tablet (40 mg total) by mouth 2 (two) times daily for 28 days, THEN 1 tablet (40 mg total) daily. 08/19/23 06/11/26  Von Bellis, MD  traMADol  (ULTRAM ) 50 MG tablet Take 1 tablet (50 mg total) by mouth at bedtime. 12/13/23   Patel, Sona, MD  valACYclovir  (VALTREX ) 500 MG tablet  Take 500 mg by mouth as directed. Take 1 tablet daily on dialysis days.(Monday, Wednesday and Friday)    [provider]  zinc  sulfate 220 (50 Zn) MG capsule Take 1 capsule (220 mg total) by mouth daily. 12/23/21   Jhonny Calvin NOVAK, MD  budesonide-formoterol (SYMBICORT) 160-4.5 MCG/ACT inhaler Inhale into the lungs. 07/14/18 03/15/19  [provider]    Physical Exam: Vitals:   12/24/23 1230 12/24/23 1350 12/24/23 1516 12/24/23 1647  BP: 100/61 (!) 84/60 (!) 99/55 (!) 91/58  Pulse:   (!) 104 (!) 111  Resp:   16 20  Temp:   98.6 F (37 C) 100.2 F (37.9 C)  TempSrc:   Oral Oral  SpO2:   97% 100%  Weight:      Height:        Physical Exam   Constitutional: Alert, awake, calm, comfortable HEENT: Neck supple Respiratory: Clear to auscultation B/L, no wheezing, no rales.  Cardiovascular: Regular rate and rhythm, no murmurs / rubs / gallops. No extremity edema. 2+ pedal pulses. No carotid bruits.  Abdomen: Soft, no tenderness, Bowel sounds positive.  Musculoskeletal: no clubbing / cyanosis. Good ROM, no contractures. Normal muscle tone.  Skin: no rashes, lesions, ulcers. Neurologic: CN 2-12 grossly intact. Sensation intact, No focal deficit identified Psychiatric: Alert and oriented x 3. Normal mood.    Labs on Admission: I have personally reviewed following labs and imaging studies  CBC: Recent Labs  Lab 12/19/23 0950 12/21/23 0855 12/23/23 0818 12/24/23 1341  WBC 6.1 4.9 3.8* 3.8*  NEUTROABS  --   --  3.1 3.8  HGB 9.4* 8.5* 7.9* 9.0*  HCT 28.0* 24.5* 22.5* 29.5*  MCV 94.9 96.5 99.1 92.5  PLT 68* 47* 51* 53*   Basic Metabolic Panel: Recent Labs  Lab 12/19/23 0950 12/21/23 0825 12/23/23 0818 12/24/23 1341  NA 135 136 138 135  K 5.4* 5.1 4.9 4.5  CL 93* 94* 94* 95*  CO2 25 26 25 26   GLUCOSE 203* 162* 116* 99  BUN 73* 60* 58* 43*  CREATININE 8.46* 8.50* 8.67* 6.61*  CALCIUM  8.5* 8.2* 8.3* 8.2*  PHOS 4.2 4.7* 5.2*  --    GFR: Estimated  Creatinine Clearance: 11.4 mL/min (A) (by C-G formula based on SCr of 6.61 mg/dL (H)). Liver Function Tests: Recent Labs  Lab 12/19/23 0950 12/21/23 0825 12/23/23 0818 12/24/23 1341  AST  --   --   --  118*  ALT  --   --   --  26  ALKPHOS  --   --   --  55  BILITOT  --   --   --  1.2  PROT  --   --   --  7.3  ALBUMIN  2.9* 2.5* 2.5* 2.6*   No results for input(s): LIPASE, AMYLASE in the last 168 hours. Recent Labs  Lab 12/24/23 1341  AMMONIA 15   Coagulation Profile: Recent Labs  Lab 12/24/23 1341  INR 1.1   Cardiac Enzymes: Recent Labs  Lab 12/24/23 1341  CKTOTAL 445*   BNP (last 3 results) No results for input(s): BNP in the last 8760 hours. HbA1C: No results for input(s): HGBA1C in the last 72 hours. CBG: Recent Labs  Lab 12/24/23 1335  GLUCAP 103*   Lipid Profile: No results for input(s): CHOL, HDL, LDLCALC, TRIG, CHOLHDL, LDLDIRECT in the last 72 hours. Thyroid  Function Tests: No results for input(s): TSH, T4TOTAL, FREET4, T3FREE, THYROIDAB in the last 72 hours. Anemia Panel: No results for input(s): VITAMINB12, FOLATE, FERRITIN, TIBC, IRON, RETICCTPCT in the last 72 hours. Urine analysis:    Component Value Date/Time   COLORURINE AMBER (A) 12/24/2023 1557   APPEARANCEUR TURBID (A) 12/24/2023 1557   LABSPEC 1.019 12/24/2023 1557   PHURINE 7.0 12/24/2023 1557   GLUCOSEU NEGATIVE 12/24/2023 1557   HGBUR SMALL (A) 12/24/2023 1557   BILIRUBINUR NEGATIVE 12/24/2023 1557   KETONESUR NEGATIVE 12/24/2023 1557   PROTEINUR 100 (A) 12/24/2023 1557   NITRITE NEGATIVE 12/24/2023 1557   LEUKOCYTESUR MODERATE (A) 12/24/2023 1557    Radiological Exams on Admission: I have personally reviewed images CT Head Wo Contrast Result Date: 12/24/2023 CLINICAL DATA:  Delirium EXAM: CT HEAD WITHOUT CONTRAST TECHNIQUE: Contiguous axial images were obtained from the base of the skull through the vertex without intravenous contrast.  RADIATION DOSE REDUCTION: This exam was performed according to the departmental dose-optimization program which includes automated exposure control, adjustment of the mA and/or kV according to patient size and/or use of iterative reconstruction technique. COMPARISON:  CT head 03/28/2023. FINDINGS: Mild to moderately motion limited study.  Within this limitation: Brain: No evidence of acute infarction, hemorrhage, hydrocephalus, extra-axial collection or mass lesion/mass effect. Vascular: No hyperdense vessel. Skull: No acute fracture. Sinuses/Orbits: Severe paranasal sinus disease with partial opacification of all sinuses. Other: No mastoid effusions. IMPRESSION: 1. No evidence of acute intracranial abnormality. 2. Severe paranasal sinus disease. Electronically Signed   By: Gilmore GORMAN Molt M.D.   On: 12/24/2023 15:16   DG Chest Portable 1 View Result Date: 12/24/2023 EXAM: 1 VIEW XRAY OF THE CHEST 12/24/2023 01:04:31 PM COMPARISON: 12/15/2023 CLINICAL HISTORY: Fever, weakness, ? pneumonia. FINDINGS: LUNGS AND PLEURA: Low lung volumes. New left basilar patchy opacities. No pleural effusion. No pneumothorax. HEART AND MEDIASTINUM: No acute abnormality of the cardiac and mediastinal silhouettes. BONES AND SOFT TISSUES: Right upper extremity vascular stent noted. No acute osseous abnormality. IMPRESSION: 1. New left basilar patchy opacities, concerning for pneumonia. 2. Low lung volumes. Electronically signed by: Waddell Calk MD 12/24/2023 01:23 PM EDT RP Workstation: [id number]    EKG: N/A    Assessment/Plan Principal Problem:   AMS (altered mental status) Active Problems:   Acute metabolic encephalopathy   Sepsis (HCC)   Hypothyroidism   Type 2 diabetes mellitus with peripheral neuropathy (HCC)   Dyslipidemia   End-stage renal disease on hemodialysis (HCC)   Thrombocytopenia (HCC)    Assessment and Plan: 58 year old female W/PMH of ESRD on hemodialysis, history of lupus CLL  in remission,  chronic thrombocytopenia, HTN, HLD, DM on insulin , history of strep 2 pneumonia meningitis, s/p multilevel laminectomy, hypothyroidism who was brought into ED on 821 for generalized weakness, pain.  Today patient had acute change in mental status, low-grade fever and  hypotension.  Hospitalist service was consulted for evaluation for admission.  1.  Acute change in mental status, hypotension, fever likely sepsis - She will be placed in observation for acute change in mental status - Lactate is normal at 1.2 - I do not see any medication that altered her mentation, except she was given tramadol  - She will be placed in observation - Chest x-ray showed possible/questionable pneumonia - She will be started on ceftriaxone  and azithromycin  - I will hold off Elavil  this may have contributed some for confusion - Will follow the cultures, MRSA PCR - I spoke to patient's daughter who advised me that patient was alert awake and oriented when she was talking to her few days ago. - Patient had diarrhea, C. difficile will be sent  2.  History of pneumonia, recently treated - Plan as above  3.  ESRD on hemodialysis - Per nephrology  4.  HTN/hypotension - Her blood pressure is rather low, discontinue amlodipine  at this point - She received 250 cc normal saline bolus in the ED with permission by nephrologist - She may need repeated dose of normal saline if blood pressure continue to go low - She is on midodrine  - Continue to monitor blood pressure  5.  DM - Will place her on insulin  sliding scale  6.  HLD - Continue on Lipitor 20 mg daily  7.  Hypothyroidism - Continue on levothyroxine   8.  GERD - Continue Protonix        DVT prophylaxis: SQ Heparin  Code Status: DNR/DNI(Do NOT Intubate) Family Communication: Spoke with patient's daughter over the phone updated the information.  Daughter wants to be called for update tomorrow. Disposition Plan: Skilled Consults called:  Nephrology/infectious disease Admission status: Observation, Telemetry bed   Nena Rebel, MD [facility] 12/24/2023, 4:51 PM

## 2023-12-24 NOTE — Progress Notes (Addendum)
 PT Cancellation Note  Patient Details Name: Tonya Myers MRN: [id number] DOB: 1965-05-12   Cancelled Treatment:     PT attempt. Pt was long sitting in bed. Supportive spouse at bedside encouraging pt to eat. Pt greets Chartered loss adjuster and remembered name from a previous admission however cognition is not the same as last admission observed by dino ~ 2 yrs prior. Spouse states, She gets like this anytime she gets any narcotics. Pt was unwilling to participate at this time. Untouched breakfast at bedside with spouse trying to promote intake. Per spouse, she has not been eating or drinking very much. Will return at a later time to promote OOB activity.    [name] 12/24/2023, 9:58 AM

## 2023-12-24 NOTE — ED Provider Notes (Signed)
   Spoke with Dr. Marcelino, advises had HD yesterday and a low grade fever was noted.  Reviewed blood pressure, including hypotension today.  Patient was also hypotensive pretreatment for dialysis yesterday.  Nephrology advises starting with a low fluid bolus about 250 mL.  I have ordered this.  Additionally will give small dose of midodrine  due to her hypotension.  Chest x-ray does not appear to show volume overload, my interpretation of her chest x-ray today is clear without acute abnormality or infiltrate.  Etiology of low-grade fevers uncertain but recent pneumonia, hemodialysis, bacteremia certainly a consideration.  I have discontinued her tramadol  and cough medication in case those are also potentially causing confusion.  Awaiting labs from today.   ----------------------------------------- 3:31 PM on 12/24/2023 -----------------------------------------   Reviewed laboratory findings today.  Slowly progressive leukopenia.  CT Head Wo Contrast Result Date: 12/24/2023 CLINICAL DATA:  Delirium EXAM: CT HEAD WITHOUT CONTRAST TECHNIQUE: Contiguous axial images were obtained from the base of the skull through the vertex without intravenous contrast. RADIATION DOSE REDUCTION: This exam was performed according to the departmental dose-optimization program which includes automated exposure control, adjustment of the mA and/or kV according to patient size and/or use of iterative reconstruction technique. COMPARISON:  CT head 03/28/2023. FINDINGS: Mild to moderately motion limited study.  Within this limitation: Brain: No evidence of acute infarction, hemorrhage, hydrocephalus, extra-axial collection or mass lesion/mass effect. Vascular: No hyperdense vessel. Skull: No acute fracture. Sinuses/Orbits: Severe paranasal sinus disease with partial opacification of all sinuses. Other: No mastoid effusions. IMPRESSION: 1. No evidence of acute intracranial abnormality. 2. Severe paranasal sinus disease.  Electronically Signed   By: Gilmore GORMAN Molt M.D.   On: 12/24/2023 15:16   DG Chest Portable 1 View Result Date: 12/24/2023 EXAM: 1 VIEW XRAY OF THE CHEST 12/24/2023 01:04:31 PM COMPARISON: 12/15/2023 CLINICAL HISTORY: Fever, weakness, ? pneumonia. FINDINGS: LUNGS AND PLEURA: Low lung volumes. New left basilar patchy opacities. No pleural effusion. No pneumothorax. HEART AND MEDIASTINUM: No acute abnormality of the cardiac and mediastinal silhouettes. BONES AND SOFT TISSUES: Right upper extremity vascular stent noted. No acute osseous abnormality. IMPRESSION: 1. New left basilar patchy opacities, concerning for pneumonia. 2. Low lung volumes. Electronically signed by: Waddell Calk MD 12/24/2023 01:23 PM EDT RP Workstation: [id number]   Radiology denotes concern of a new left basilar opacity.  On my viewing I do not see obvious opacification, but this could represent a potential cause for infection and she has known recent treatment for pneumonia.  Consulted with our hospitalist Dr. Roann, will see and evaluate.  After discussing imaging history etc., hospitalist advising does not feel strong need to initiate antibiotic at this point without further.  Internal medicine service to see and make further decisions as to treatment and if antibiotic initiation would be warranted etc.     Dicky Anes, MD 12/24/23 [phone]

## 2023-12-24 NOTE — ED Notes (Signed)
 Per EDT Janeeta pt cleaned of runny stool, new brief and barrier cream placed. EDT noticed wound on bottom, pad placed on bottom.

## 2023-12-24 NOTE — ED Notes (Signed)
 Pt stating she hurts a little bit in hands.

## 2023-12-24 NOTE — ED Notes (Signed)
 This tech, Ashley NT, [place] NT, changed this pt at this time. Pt had bowel incontinence and found an open pressure wound. RN notified. Pt is in bed, call bell at reach.

## 2023-12-24 NOTE — Progress Notes (Signed)
 Pt very lethargic at this time and falling asleep while this RN  talking/attempting to complete admission.  Pt also with either delayed responses or nonverbal.  Unable to complete entire admission at this time

## 2023-12-24 NOTE — Progress Notes (Signed)
 PT Cancellation Note  Patient Details Name: Tonya Myers MRN: [id number] DOB: 1965-09-10   Cancelled Treatment:     PT attempt. 2nd attempt today. Pt out of room for CT. Acute PT will return at a later date and continue to follow per current POC.    [name] 12/24/2023, 2:45 PM

## 2023-12-25 ENCOUNTER — Observation Stay

## 2023-12-25 DIAGNOSIS — R569 Unspecified convulsions: Secondary | ICD-10-CM | POA: Diagnosis not present

## 2023-12-25 DIAGNOSIS — G40909 Epilepsy, unspecified, not intractable, without status epilepticus: Secondary | ICD-10-CM | POA: Diagnosis present

## 2023-12-25 DIAGNOSIS — D696 Thrombocytopenia, unspecified: Secondary | ICD-10-CM | POA: Diagnosis not present

## 2023-12-25 DIAGNOSIS — C9111 Chronic lymphocytic leukemia of B-cell type in remission: Secondary | ICD-10-CM | POA: Diagnosis present

## 2023-12-25 DIAGNOSIS — R404 Transient alteration of awareness: Secondary | ICD-10-CM

## 2023-12-25 DIAGNOSIS — F439 Reaction to severe stress, unspecified: Secondary | ICD-10-CM | POA: Diagnosis not present

## 2023-12-25 DIAGNOSIS — D631 Anemia in chronic kidney disease: Secondary | ICD-10-CM | POA: Diagnosis present

## 2023-12-25 DIAGNOSIS — L89310 Pressure ulcer of right buttock, unstageable: Secondary | ICD-10-CM | POA: Diagnosis not present

## 2023-12-25 DIAGNOSIS — E1122 Type 2 diabetes mellitus with diabetic chronic kidney disease: Secondary | ICD-10-CM | POA: Diagnosis present

## 2023-12-25 DIAGNOSIS — M3219 Other organ or system involvement in systemic lupus erythematosus: Secondary | ICD-10-CM | POA: Diagnosis present

## 2023-12-25 DIAGNOSIS — N186 End stage renal disease: Secondary | ICD-10-CM | POA: Diagnosis present

## 2023-12-25 DIAGNOSIS — D72819 Decreased white blood cell count, unspecified: Secondary | ICD-10-CM | POA: Diagnosis not present

## 2023-12-25 DIAGNOSIS — L89153 Pressure ulcer of sacral region, stage 3: Secondary | ICD-10-CM | POA: Diagnosis not present

## 2023-12-25 DIAGNOSIS — A419 Sepsis, unspecified organism: Secondary | ICD-10-CM | POA: Diagnosis not present

## 2023-12-25 DIAGNOSIS — R627 Adult failure to thrive: Secondary | ICD-10-CM | POA: Diagnosis present

## 2023-12-25 DIAGNOSIS — I7782 Antineutrophilic cytoplasmic antibody (ANCA) vasculitis: Secondary | ICD-10-CM | POA: Diagnosis present

## 2023-12-25 DIAGNOSIS — Z992 Dependence on renal dialysis: Secondary | ICD-10-CM | POA: Diagnosis not present

## 2023-12-25 DIAGNOSIS — Z66 Do not resuscitate: Secondary | ICD-10-CM | POA: Diagnosis present

## 2023-12-25 DIAGNOSIS — R4182 Altered mental status, unspecified: Secondary | ICD-10-CM | POA: Diagnosis present

## 2023-12-25 DIAGNOSIS — M329 Systemic lupus erythematosus, unspecified: Secondary | ICD-10-CM | POA: Diagnosis not present

## 2023-12-25 DIAGNOSIS — A601 Herpesviral infection of perianal skin and rectum: Secondary | ICD-10-CM | POA: Diagnosis not present

## 2023-12-25 DIAGNOSIS — G053 Encephalitis and encephalomyelitis in diseases classified elsewhere: Secondary | ICD-10-CM | POA: Diagnosis present

## 2023-12-25 DIAGNOSIS — G9340 Encephalopathy, unspecified: Secondary | ICD-10-CM

## 2023-12-25 DIAGNOSIS — D61818 Other pancytopenia: Secondary | ICD-10-CM | POA: Diagnosis present

## 2023-12-25 DIAGNOSIS — G9341 Metabolic encephalopathy: Secondary | ICD-10-CM | POA: Diagnosis present

## 2023-12-25 DIAGNOSIS — D761 Hemophagocytic lymphohistiocytosis: Secondary | ICD-10-CM | POA: Diagnosis present

## 2023-12-25 DIAGNOSIS — R509 Fever, unspecified: Secondary | ICD-10-CM | POA: Diagnosis not present

## 2023-12-25 DIAGNOSIS — Z1152 Encounter for screening for COVID-19: Secondary | ICD-10-CM | POA: Diagnosis not present

## 2023-12-25 DIAGNOSIS — I120 Hypertensive chronic kidney disease with stage 5 chronic kidney disease or end stage renal disease: Secondary | ICD-10-CM | POA: Diagnosis present

## 2023-12-25 LAB — RESPIRATORY PANEL BY PCR

## 2023-12-25 LAB — CBC
HCT: 22.7 % — ABNORMAL LOW (ref 36.0–46.0)
Hemoglobin: 7.2 g/dL — ABNORMAL LOW (ref 12.0–15.0)
MCH: 29.5 pg (ref 26.0–34.0)
MCHC: 31.7 g/dL (ref 30.0–36.0)
MCV: 93 fL (ref 80.0–100.0)
Platelets: 51 K/uL — ABNORMAL LOW (ref 150–400)
RBC: 2.44 MIL/uL — ABNORMAL LOW (ref 3.87–5.11)
RDW: 18.1 % — ABNORMAL HIGH (ref 11.5–15.5)
WBC: 3.7 K/uL — ABNORMAL LOW (ref 4.0–10.5)
nRBC: 0 % (ref 0.0–0.2)

## 2023-12-25 LAB — PROTIME-INR
INR: 1.2 (ref 0.8–1.2)
Prothrombin Time: 15.4 s — ABNORMAL HIGH (ref 11.4–15.2)

## 2023-12-25 LAB — COMPREHENSIVE METABOLIC PANEL WITH GFR
ALT: 23 U/L (ref 0–44)
AST: 105 U/L — ABNORMAL HIGH (ref 15–41)
Albumin: 2.5 g/dL — ABNORMAL LOW (ref 3.5–5.0)
Alkaline Phosphatase: 50 U/L (ref 38–126)
Anion gap: 18 — ABNORMAL HIGH (ref 5–15)
BUN: 55 mg/dL — ABNORMAL HIGH (ref 6–20)
CO2: 26 mmol/L (ref 22–32)
Calcium: 8 mg/dL — ABNORMAL LOW (ref 8.9–10.3)
Chloride: 97 mmol/L — ABNORMAL LOW (ref 98–111)
Creatinine, Ser: 7.98 mg/dL — ABNORMAL HIGH (ref 0.44–1.00)
GFR, Estimated: 5 mL/min — ABNORMAL LOW (ref >60)
Glucose, Bld: 124 mg/dL — ABNORMAL HIGH (ref 70–99)
Potassium: 4.5 mmol/L (ref 3.5–5.1)
Sodium: 141 mmol/L (ref 135–145)
Total Bilirubin: 0.9 mg/dL (ref 0.0–1.2)
Total Protein: 6.3 g/dL — ABNORMAL LOW (ref 6.5–8.1)

## 2023-12-25 LAB — PROCALCITONIN: Procalcitonin: 5.29 ng/mL

## 2023-12-25 MED ORDER — CALCIUM CARBONATE ANTACID 500 MG PO CHEW
1.0000 | CHEWABLE_TABLET | Freq: Three times a day (TID) | ORAL | Status: DC | PRN
  Administered 2023-12-25: 200 mg via ORAL
  Filled 2023-12-25: qty 1

## 2023-12-25 MED ORDER — ALUM & MAG HYDROXIDE-SIMETH 200-200-20 MG/5ML PO SUSP: 15.0000 mL | Freq: Four times a day (QID) | ORAL | Status: DC | PRN

## 2023-12-25 MED ORDER — MEDIHONEY WOUND/BURN DRESSING EX PSTE
1.0000 | PASTE | Freq: Every day | CUTANEOUS | Status: DC
  Administered 2023-12-25 – 2023-12-31 (×6): 1 via TOPICAL
  Filled 2023-12-25: qty 44

## 2023-12-25 MED ORDER — AZITHROMYCIN 250 MG PO TABS
500.0000 mg | ORAL_TABLET | Freq: Every day | ORAL | Status: DC
  Administered 2023-12-25: 500 mg via ORAL
  Filled 2023-12-25: qty 2

## 2023-12-25 NOTE — Progress Notes (Signed)
 PROGRESS NOTE    Tonya Myers  FMW:[id number] DOB: Oct 18, 1965 DOA: 12/16/2023 PCP: Valora Agent, MD  [id number]  LOS: 0 days   Brief hospital course:   Assessment & Plan: Tonya Myers is a pleasant 58 y.o. female with medical history significant for ESRD on hemodialysis, CLL in remission, thrombocytopenia, history of strep pneumonia meningitis, s/p multilevel laminectomy, HLD, HTN, DM, depression, anxiety, hypothyroidism, recent treatment of pneumonia and discharged home on 12/13/2023 and came to emergency room on 12/15/2023, who was found to have acute change in mental status today and hospitalist service was consulted for admission.  On 12/15/2023 patient came back from home concerning for generalized pain and weakness not able to function at home.  That time patient was seen by hospitalist in the ED and advised that patient does not need admission as there is no acute issues.  Patient's vital signs at the time was temperature 98.1, respiration 16 heart rate 86, blood pressure 113/90, SpO2 98% on room air. Today, went ED physician was rounding this patient, patient had a low-grade temperature, hypotension and acute change in mental status.  Nephrology seen the patient and advised for starting 250 cc bolus of normal saline, small dose of midodrine  for hypotension, chest x-ray reported by radiologist for possible infiltrate.  Etiology of low-grade fevers uncertain but history of recent pneumonia, hemodialysis, bacteremia certainly a consideration.  Hospitalist service was consulted for evaluation for admission for acute change in mental status and possible concern for sepsis.   Acute change in mental status --etiology unclear at this time.    Sepsis ruled out --temp did not meet criteria for fever.    Presumed PNA - Chest x-ray showed New left basilar patchy opacities, concerning for pneumonia.  Procal elevated at 5.29 --started on ceftriaxone  and azithro --cont for  now  Abdominal pain Chronic pancreatitis --Lipase appears chronically elevated around 80's --KUB today no acute finding --cont Creon    ESRD on hemodialysis --iHD per nephro   HTN, not currently active hypotension --cont midodrine    DM --last A1c 5.4.   HLD - Continue on Lipitor 20 mg daily   Hypothyroidism - Continue on levothyroxine    GERD - Continue Protonix    DVT prophylaxis: Heparin  SQ Code Status: DNR  Family Communication:  Level of care: Med-Surg Dispo:   The patient is from: home Anticipated d/c is to: SNF rehab Anticipated d/c date is: 1-2 days   Subjective and Interval History:  Pt complained of abdominal pain for 2-3 days.  KUB showed no acute finding, but Chronic calcific pancreatitis.   Objective: Vitals:   12/24/23 2116 12/25/23 0427 12/25/23 0848 12/25/23 1432  BP:  105/61 116/70 117/74  Pulse:  99 92 99  Resp:  18 18 20   Temp:  98.8 F (37.1 C) 98 F (36.7 C) 98.7 F (37.1 C)  TempSrc:  Oral Oral Oral  SpO2: 95% 100% 95% 95%  Weight:      Height:        Intake/Output Summary (Last 24 hours) at 12/25/2023 1755 Last data filed at 12/25/2023 0900 Gross per 24 hour  Intake 350 ml  Output --  Net 350 ml   Filed Weights   12/21/23 0730 12/21/23 1222 12/23/23 0810  Weight: 88.3 kg 88.3 kg 89.5 kg    Examination:   Constitutional: NAD, alert, oriented to person and place HEENT: conjunctivae and lids normal, EOMI CV: No cyanosis.   RESP: normal respiratory effort, on RA Neuro: II - XII grossly intact.  Data Reviewed: I have personally reviewed labs and imaging studies  Time spent: 50 minutes  [name], MD [facility] If 7PM-7AM, please contact night-coverage 12/25/2023, 5:55 PM

## 2023-12-25 NOTE — Progress Notes (Signed)
 Date of Admission:  12/16/2023    ID: Tonya Myers is a 58 y.o. female  Principal Problem:   AMS (altered mental status) Active Problems:   Hypothyroidism   Acute metabolic encephalopathy   Thrombocytopenia (HCC)   Dyslipidemia   Type 2 diabetes mellitus with peripheral neuropathy (HCC)   End-stage renal disease on hemodialysis (HCC)   Sepsis (HCC)  Patient is known to me from previous admissions in 2022 She is a limited historian Chart reviewed in detail Reviewed care everywhere and records from [facility] and [place] as well. Tonya Myers is a 58 y.o. 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, extensive thoracic space infection underwent laminectomy of T3-T4, T5 T7, T9-T10 and evacuation of abscess and treated with appropriate Antibiotics for more than 6 weeks,, diagnosed with PE and correlated vasculitis and AKI in December 13 2198 received high-dose steroids and rituximab  and later had to go on dialysis, seizure disorder recurrent hospitalizations the entire year of 2023 at [facility] for encephalopathy, seizures and had lumbar puncture with increasing protein Never been on treatment for ANCA vasculitis or a positive ANA with  dsDNA   Had seen rheumatology in the past at [facility] but not as outpatient, has been followed by neurology as outpatient for the seizures and peripheral neuropathy Was recently in [facility] between 12/11/2023 until 12/13/2023 for encephalopathy and treatment of pneumonia and there was a plan to discharge her to skilled nursing facility but she went home.  But she came back to the ED on 12/15/2023 as she could not manage at home by herself. She stayed in the ED from 12/15/2023 until today waiting for a SNF placement.  Today she was sent to the hospitalist site for admission because she had low BP and suspect sepsis was question  Subjective: Pt is awake Says she had abdominal  cramping No diarrhea No sob Some cough  Medications:   atorvastatin   20 mg Oral Daily   azithromycin   500 mg Oral q1800   calcium  acetate  1,334 mg Oral TID WC   fluticasone   2 spray Each Nare Daily   folic acid   1 mg Oral Daily   heparin   5,000 Units Subcutaneous Q8H   leptospermum manuka honey  1 Application Topical Daily   levothyroxine   187.5 mcg Oral Q Sun   lipase/protease/amylase  12,000 Units Oral TID WC   loratadine   10 mg Oral Daily   melatonin  5 mg Oral QHS   midodrine   2.5 mg Oral Q M,W,F   multivitamin  1 tablet Oral QHS   pantoprazole   40 mg Oral Daily   zinc  sulfate (50mg  elemental zinc )  220 mg Oral Daily    Objective: Vital signs in last 24 hours: Patient Vitals for the past 24 hrs:  BP Temp Temp src Pulse Resp SpO2  12/25/23 1432 117/74 98.7 F (37.1 C) Oral 99 20 95 %  12/25/23 0848 116/70 98 F (36.7 C) Oral 92 18 95 %  12/25/23 0427 105/61 98.8 F (37.1 C) Oral 99 18 100 %  12/24/23 2116 -- -- -- -- -- 95 %  12/24/23 2102 102/67 98.2 F (36.8 C) Axillary 75 18 (!) 68 %  12/24/23 1845 (!) 137/111 98.6 F (37 C) Oral 66 -- 95 %      PHYSICAL EXAM:  General: awake , cooperative, no distress, oriented in person, place, year- responds to questions appropriately- some confusion.  Lungs:b/l air entry Heart: Regular rate and rhythm, no murmur, rub or gallop. Abdomen: Soft, non-tender,not distended. Bowel sounds normal. No masses Extremities: atraumatic, no cyanosis. No edema. No clubbing Skin: No rashes or lesions. Or bruising Lymph: Cervical, supraclavicular normal. Neurologic: moves all limbs  Lab Results    Latest Ref Rng & Units 12/25/2023    4:54 AM 12/24/2023    1:41 PM 12/23/2023    8:18 AM  CBC  WBC 4.0 - 10.5 K/uL 3.7  3.8  3.8   Hemoglobin 12.0 - 15.0 g/dL 7.2  9.0  7.9   Hematocrit 36.0 - 46.0 % 22.7  29.5  22.5   Platelets 150 - 400 K/uL 51  53  51        Latest Ref Rng & Units 12/25/2023    4:54 AM 12/24/2023    1:41 PM 12/23/2023     8:18 AM  CMP  Glucose 70 - 99 mg/dL 875  99  883   BUN 6 - 20 mg/dL 55  43  58   Creatinine 0.44 - 1.00 mg/dL 2.01  3.38  1.32   Sodium 135 - 145 mmol/L 141  135  138   Potassium 3.5 - 5.1 mmol/L 4.5  4.5  4.9   Chloride 98 - 111 mmol/L 97  95  94   CO2 22 - 32 mmol/L 26  26  25    Calcium  8.9 - 10.3 mg/dL 8.0  8.2  8.3   Total Protein 6.5 - 8.1 g/dL 6.3  7.3    Total Bilirubin 0.0 - 1.2 mg/dL 0.9  1.2    Alkaline Phos 38 - 126 U/L 50  55    AST 15 - 41 U/L 105  118    ALT 0 - 44 U/L 23  26        Microbiology: BC- NG Studies/Results: DG Abd 1 View Result Date: 12/25/2023 CLINICAL DATA:  Abdominal pain. EXAM: ABDOMEN - 1 VIEW COMPARISON:  None Available. FINDINGS: Normal bowel gas pattern. There are calcifications in the left upper quadrant, corresponding of the pancreas. Lung bases are clear. IMPRESSION: 1. No acute findings. 2. Chronic calcific pancreatitis. Electronically Signed   By: Newell Eke M.D.   On: 12/25/2023 14:18   CT Head Wo Contrast Result Date: 12/24/2023 CLINICAL DATA:  Delirium EXAM: CT HEAD WITHOUT CONTRAST TECHNIQUE: Contiguous axial images were obtained from the base of the skull through the vertex without intravenous contrast. RADIATION DOSE REDUCTION: This exam was performed according to the departmental dose-optimization program which includes automated exposure control, adjustment of the mA and/or kV according to patient size and/or use of iterative reconstruction technique. COMPARISON:  CT head 03/28/2023. FINDINGS: Mild to moderately motion limited study.  Within this limitation: Brain: No evidence of acute infarction, hemorrhage, hydrocephalus, extra-axial collection or mass lesion/mass effect. Vascular: No hyperdense vessel. Skull: No acute fracture. Sinuses/Orbits: Severe paranasal sinus disease with partial opacification of all sinuses. Other: No mastoid effusions. IMPRESSION: 1. No evidence of acute intracranial abnormality. 2. Severe paranasal sinus  disease. Electronically Signed   By: Gilmore GORMAN Molt M.D.   On: 12/24/2023 15:16   DG Chest Portable 1 View Result Date: 12/24/2023 EXAM: 1 VIEW XRAY OF THE CHEST 12/24/2023 01:04:31 PM COMPARISON: 12/15/2023 CLINICAL HISTORY: Fever, weakness, ? pneumonia. FINDINGS: LUNGS AND PLEURA: Low lung volumes. New left basilar patchy opacities. No pleural effusion. No pneumothorax. HEART AND MEDIASTINUM: No acute abnormality of the cardiac and mediastinal silhouettes. BONES AND SOFT TISSUES: Right upper extremity vascular  stent noted. No acute osseous abnormality. IMPRESSION: 1. New left basilar patchy opacities, concerning for pneumonia. 2. Low lung volumes. Electronically signed by: Waddell Calk MD 12/24/2023 01:23 PM EDT RP Workstation: [id number]     Assessment/Plan: Altered mental status-none- she is at her baseline  Encephalopathy patient has had fluctuating altered mental status for the past 3 years she is alert at one time and then more sleepy at other times She has had multiple lumbar punctures ,In  February 2022 she had pneumococcal meningitis and after that she has not had any evidence of pleocytosis in 4 different lumbar puncture  done after that She has had some discordant increase in protein.  She had been followed by neurology in the past.  There was a concern at one time for lupus cerebritis but it was inconclusive and no treatment was given On 12/11/2023 ANA was  positive again  and double-stranded DNA was elevated at 300 which is extremely high compared to 11 in 2022.  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 Patient at that  time took 3 doses of high-dose steroids and rituximab  I do not think she has been on any Rx  after that She is followed by nephrologist and she is on dialysis now   Patient does not look septic I doubt she has  pneumonia Though the chest x-ray is reported as a small infiltrate in the right lower lobe Patient is currently on ceftriaxone  and azithromycin   Will DC antibiotics\    Thromocytopenia chronic  Anemia  Leucopenia ( intermittent)   Abdominal cramping- Xray shews chronic calcified pancreatitis   History of Treated pneumococcal meningitis and thoracic spine infection in 2022      history of CLL   Need to discuss with rheumatologist regarding starting treatment for autoimmune condition Also discuss with neurology regarding the possibility of her having lupus cerebritis and  whether that is the cause of her seizures   Social issues.  Patient lives on her own and she is unable to cope on her own.      Discussed the management with the patient and Dr.Lai and her nurse  ________________________________________________

## 2023-12-25 NOTE — Progress Notes (Addendum)
 Pt's IV site became painful with some minor swelling noted during Rocephin  transfusion.  Infusion stopped and IV team notified of need for new IV insertion.  Upon assessment, IV RN unable to locate new site with ultrasound.  Awanda, MD, notified and made aware.  No new orders as of the writing of this note.    1853 MD to d/c IV abx.  Verbal order obtained to leave out IV.

## 2023-12-25 NOTE — Plan of Care (Signed)
   Problem: Activity: Goal: Ability to tolerate increased activity will improve Outcome: Progressing   Problem: Clinical Measurements: Goal: Ability to maintain a body temperature in the normal range will improve Outcome: Progressing   Problem: Respiratory: Goal: Ability to maintain adequate ventilation will improve Outcome: Progressing Goal: Ability to maintain a clear airway will improve Outcome: Progressing   Problem: Education: Goal: Knowledge of General Education information will improve Description: Including pain rating scale, medication(s)/side effects and non-pharmacologic comfort measures Outcome: Progressing   Problem: Health Behavior/Discharge Planning: Goal: Ability to manage health-related needs will improve Outcome: Progressing   Problem: Clinical Measurements: Goal: Ability to maintain clinical measurements within normal limits will improve Outcome: Progressing Goal: Will remain free from infection Outcome: Progressing Goal: Diagnostic test results will improve Outcome: Progressing Goal: Respiratory complications will improve Outcome: Progressing Goal: Cardiovascular complication will be avoided Outcome: Progressing   Problem: Activity: Goal: Risk for activity intolerance will decrease Outcome: Progressing   Problem: Nutrition: Goal: Adequate nutrition will be maintained Outcome: Progressing   Problem: Coping: Goal: Level of anxiety will decrease Outcome: Progressing   Problem: Elimination: Goal: Will not experience complications related to bowel motility Outcome: Progressing Goal: Will not experience complications related to urinary retention Outcome: Progressing   Problem: Pain Managment: Goal: General experience of comfort will improve and/or be controlled Outcome: Progressing   Problem: Safety: Goal: Ability to remain free from injury will improve Outcome: Progressing   Problem: Skin Integrity: Goal: Risk for impaired skin integrity  will decrease Outcome: Progressing

## 2023-12-25 NOTE — Progress Notes (Signed)
 [facility]  ROUNDING NOTE   Subjective:   Patient has been admitted under observation for Physical deconditioning [R53.81] Failure to thrive in adult [R62.7] Chronic kidney disease on chronic dialysis (HCC) [N18.6, Z99.2] Community acquired pneumonia of left lower lobe of lung [J18.9] AMS (altered mental status) [R41.82]  She was found to be febrile with hypotension and altered mental status in ED. She remains altered with delayed verbal response.   Objective:  Vital signs in last 24 hours:  Temp:  [98 F (36.7 C)-100.2 F (37.9 C)] 98 F (36.7 C) (08/31 0848) Pulse Rate:  [66-111] 92 (08/31 0848) Resp:  [16-20] 18 (08/31 0848) BP: (84-137)/(55-111) 116/70 (08/31 0848) SpO2:  [68 %-100 %] 95 % (08/31 0848)  Weight change:  Filed Weights   12/21/23 0730 12/21/23 1222 12/23/23 0810  Weight: 88.3 kg 88.3 kg 89.5 kg    Intake/Output: I/O last 3 completed shifts: In: 350 [IV Piggyback:350] Out: -    Intake/Output this shift:  No intake/output data recorded.  Physical Exam: General: NAD, ill appearing   Head: Normocephalic  Eyes: Anicteric  Lungs:  Clear to auscultation  Heart: Regular rate   Abdomen:  Soft, nontender  Extremities:  trace peripheral edema.  Neurologic: Alert, awake, expressive aphasia  Skin: No lesions  Access: Rt AVF    Basic Metabolic Panel: Recent Labs  Lab 12/19/23 0950 12/21/23 0825 12/23/23 0818 12/24/23 1341 12/25/23 0454  NA 135 136 138 135 141  K 5.4* 5.1 4.9 4.5 4.5  CL 93* 94* 94* 95* 97*  CO2 25 26 25 26 26   GLUCOSE 203* 162* 116* 99 124*  BUN 73* 60* 58* 43* 55*  CREATININE 8.46* 8.50* 8.67* 6.61* 7.98*  CALCIUM  8.5* 8.2* 8.3* 8.2* 8.0*  PHOS 4.2 4.7* 5.2*  --   --     Liver Function Tests: Recent Labs  Lab 12/19/23 0950 12/21/23 0825 12/23/23 0818 12/24/23 1341 12/25/23 0454  AST  --   --   --  118* 105*  ALT  --   --   --  26 23  ALKPHOS  --   --   --  55 50  BILITOT  --   --   --  1.2 0.9  PROT   --   --   --  7.3 6.3*  ALBUMIN  2.9* 2.5* 2.5* 2.6* 2.5*   No results for input(s): LIPASE, AMYLASE in the last 168 hours.  Recent Labs  Lab 12/24/23 1341  AMMONIA 15    CBC: Recent Labs  Lab 12/19/23 0950 12/21/23 0855 12/23/23 0818 12/24/23 1341 12/25/23 0454  WBC 6.1 4.9 3.8* 3.8* 3.7*  NEUTROABS  --   --  3.1 3.8  --   HGB 9.4* 8.5* 7.9* 9.0* 7.2*  HCT 28.0* 24.5* 22.5* 29.5* 22.7*  MCV 94.9 96.5 99.1 92.5 93.0  PLT 68* 47* 51* 53* 51*    Cardiac Enzymes: Recent Labs  Lab 12/24/23 1341  CKTOTAL 445*    BNP: Invalid input(s): POCBNP  CBG: Recent Labs  Lab 12/24/23 1335  GLUCAP 103*     Microbiology: Results for orders placed or performed during the hospital encounter of 12/16/23  Resp panel by RT-PCR (RSV, Flu A&B, Covid) Anterior Nasal Swab     Status: None   Collection Time: 12/19/23  4:21 PM   Specimen: Anterior Nasal Swab  Result Value Ref Range Status   SARS Coronavirus 2 by RT PCR NEGATIVE NEGATIVE Final    Comment: (NOTE) SARS-CoV-2 target nucleic acids  are NOT DETECTED.  The SARS-CoV-2 RNA is generally detectable in upper respiratory specimens during the acute phase of infection. The lowest concentration of SARS-CoV-2 viral copies this assay can detect is 138 copies/mL. A negative result does not preclude SARS-Cov-2 infection and should not be used as the sole basis for treatment or other patient management decisions. A negative result may occur with  improper specimen collection/handling, submission of specimen other than nasopharyngeal swab, presence of viral mutation(s) within the areas targeted by this assay, and inadequate number of viral copies(<138 copies/mL). A negative result must be combined with clinical observations, patient history, and epidemiological information. The expected result is Negative.  Fact Sheet for Patients:  BloggerCourse.com  Fact Sheet for Healthcare Providers:   SeriousBroker.it  This test is no t yet approved or cleared by the [place]  FDA and  has been authorized for detection and/or diagnosis of SARS-CoV-2 by FDA under an Emergency Use Authorization (EUA). This EUA will remain  in effect (meaning this test can be used) for the duration of the COVID-19 declaration under Section 564(b)(1) of the Act, 21 U.S.C.section 360bbb-3(b)(1), unless the authorization is terminated  or revoked sooner.       Influenza A by PCR NEGATIVE NEGATIVE Final   Influenza B by PCR NEGATIVE NEGATIVE Final    Comment: (NOTE) The Xpert Xpress SARS-CoV-2/FLU/RSV plus assay is intended as an aid in the diagnosis of influenza from Nasopharyngeal swab specimens and should not be used as a sole basis for treatment. Nasal washings and aspirates are unacceptable for Xpert Xpress SARS-CoV-2/FLU/RSV testing.  Fact Sheet for Patients: BloggerCourse.com  Fact Sheet for Healthcare Providers: SeriousBroker.it  This test is not yet approved or cleared by the [place]  FDA and has been authorized for detection and/or diagnosis of SARS-CoV-2 by FDA under an Emergency Use Authorization (EUA). This EUA will remain in effect (meaning this test can be used) for the duration of the COVID-19 declaration under Section 564(b)(1) of the Act, 21 U.S.C. section 360bbb-3(b)(1), unless the authorization is terminated or revoked.     Resp Syncytial Virus by PCR NEGATIVE NEGATIVE Final    Comment: (NOTE) Fact Sheet for Patients: BloggerCourse.com  Fact Sheet for Healthcare Providers: SeriousBroker.it  This test is not yet approved or cleared by the [place]  FDA and has been authorized for detection and/or diagnosis of SARS-CoV-2 by FDA under an Emergency Use Authorization (EUA). This EUA will remain in effect (meaning this test can be used) for  the duration of the COVID-19 declaration under Section 564(b)(1) of the Act, 21 U.S.C. section 360bbb-3(b)(1), unless the authorization is terminated or revoked.  Performed at [facility], [address] [place], [place] [address]   Group A Strep by PCR [facility] Only)     Status: None   Collection Time: 12/19/23  4:21 PM   Specimen: Anterior Nasal Swab; Sterile Swab  Result Value Ref Range Status   Group A Strep by PCR NOT DETECTED NOT DETECTED Final    Comment: Performed at [facility], [address] [place], [place] [address]  Resp panel by RT-PCR (RSV, Flu A&B, Covid) Anterior Nasal Swab     Status: None   Collection Time: 12/24/23  1:39 PM   Specimen: Anterior Nasal Swab  Result Value Ref Range Status   SARS Coronavirus 2 by RT PCR NEGATIVE NEGATIVE Final    Comment: (NOTE) SARS-CoV-2 target nucleic acids are NOT DETECTED.  The SARS-CoV-2 RNA is generally detectable in upper respiratory specimens during the  acute phase of infection. The lowest concentration of SARS-CoV-2 viral copies this assay can detect is 138 copies/mL. A negative result does not preclude SARS-Cov-2 infection and should not be used as the sole basis for treatment or other patient management decisions. A negative result may occur with  improper specimen collection/handling, submission of specimen other than nasopharyngeal swab, presence of viral mutation(s) within the areas targeted by this assay, and inadequate number of viral copies(<138 copies/mL). A negative result must be combined with clinical observations, patient history, and epidemiological information. The expected result is Negative.  Fact Sheet for Patients:  BloggerCourse.com  Fact Sheet for Healthcare Providers:  SeriousBroker.it  This test is no t yet approved or cleared by the [place]  FDA and  has been authorized for detection and/or diagnosis of SARS-CoV-2  by FDA under an Emergency Use Authorization (EUA). This EUA will remain  in effect (meaning this test can be used) for the duration of the COVID-19 declaration under Section 564(b)(1) of the Act, 21 U.S.C.section 360bbb-3(b)(1), unless the authorization is terminated  or revoked sooner.       Influenza A by PCR NEGATIVE NEGATIVE Final   Influenza B by PCR NEGATIVE NEGATIVE Final    Comment: (NOTE) The Xpert Xpress SARS-CoV-2/FLU/RSV plus assay is intended as an aid in the diagnosis of influenza from Nasopharyngeal swab specimens and should not be used as a sole basis for treatment. Nasal washings and aspirates are unacceptable for Xpert Xpress SARS-CoV-2/FLU/RSV testing.  Fact Sheet for Patients: BloggerCourse.com  Fact Sheet for Healthcare Providers: SeriousBroker.it  This test is not yet approved or cleared by the [place]  FDA and has been authorized for detection and/or diagnosis of SARS-CoV-2 by FDA under an Emergency Use Authorization (EUA). This EUA will remain in effect (meaning this test can be used) for the duration of the COVID-19 declaration under Section 564(b)(1) of the Act, 21 U.S.C. section 360bbb-3(b)(1), unless the authorization is terminated or revoked.     Resp Syncytial Virus by PCR NEGATIVE NEGATIVE Final    Comment: (NOTE) Fact Sheet for Patients: BloggerCourse.com  Fact Sheet for Healthcare Providers: SeriousBroker.it  This test is not yet approved or cleared by the [place]  FDA and has been authorized for detection and/or diagnosis of SARS-CoV-2 by FDA under an Emergency Use Authorization (EUA). This EUA will remain in effect (meaning this test can be used) for the duration of the COVID-19 declaration under Section 564(b)(1) of the Act, 21 U.S.C. section 360bbb-3(b)(1), unless the authorization is terminated or revoked.  Performed at  [facility], [address] [place], [place] [address]   Blood Culture (routine x 2)     Status: None (Preliminary result)   Collection Time: 12/24/23  1:41 PM   Specimen: BLOOD  Result Value Ref Range Status   Specimen Description BLOOD BLOOD LEFT FOREARM  Final   Special Requests   Final    BOTTLES DRAWN AEROBIC AND ANAEROBIC Blood Culture adequate volume   Culture   Final    NO GROWTH < 24 HOURS Performed at [facility], [address], [place], [place] [address]    Report Status PENDING  Incomplete  Blood Culture (routine x 2)     Status: None (Preliminary result)   Collection Time: 12/24/23  1:41 PM   Specimen: BLOOD LEFT HAND  Result Value Ref Range Status   Specimen Description BLOOD LEFT HAND  Final   Special Requests   Final    BOTTLES DRAWN AEROBIC AND ANAEROBIC Blood  Culture results may not be optimal due to an inadequate volume of blood received in culture bottles   Culture   Final    NO GROWTH < 24 HOURS Performed at [facility], [address] [place], [place] [address]    Report Status PENDING  Incomplete  MRSA Next Gen by PCR, Nasal     Status: Abnormal   Collection Time: 12/24/23  5:49 PM   Specimen: Nasal Mucosa; Nasal Swab  Result Value Ref Range Status   MRSA by PCR Next Gen DETECTED (A) NOT DETECTED Final    Comment: RESULT CALLED TO, READ BACK BY AND VERIFIED WITH: AMY MCCRAY RN 12/24/23 1937 KG (NOTE) The GeneXpert MRSA Assay (FDA approved for NASAL specimens only), is one component of a comprehensive MRSA colonization surveillance program. It is not intended to diagnose MRSA infection nor to guide or monitor treatment for MRSA infections. Test performance is not FDA approved in patients less than 20 years old. Performed at [facility], [address] [place], [place] [address]     Coagulation Studies: Recent Labs    12/24/23 1341 12/25/23 0454  LABPROT 14.9 15.4*  INR 1.1 1.2     Urinalysis: Recent Labs    12/24/23 1557  COLORURINE AMBER*  LABSPEC 1.019  PHURINE 7.0  GLUCOSEU NEGATIVE  HGBUR SMALL*  BILIRUBINUR NEGATIVE  KETONESUR NEGATIVE  PROTEINUR 100*  NITRITE NEGATIVE  LEUKOCYTESUR MODERATE*      Imaging: CT Head Wo Contrast Result Date: 12/24/2023 CLINICAL DATA:  Delirium EXAM: CT HEAD WITHOUT CONTRAST TECHNIQUE: Contiguous axial images were obtained from the base of the skull through the vertex without intravenous contrast. RADIATION DOSE REDUCTION: This exam was performed according to the departmental dose-optimization program which includes automated exposure control, adjustment of the mA and/or kV according to patient size and/or use of iterative reconstruction technique. COMPARISON:  CT head 03/28/2023. FINDINGS: Mild to moderately motion limited study.  Within this limitation: Brain: No evidence of acute infarction, hemorrhage, hydrocephalus, extra-axial collection or mass lesion/mass effect. Vascular: No hyperdense vessel. Skull: No acute fracture. Sinuses/Orbits: Severe paranasal sinus disease with partial opacification of all sinuses. Other: No mastoid effusions. IMPRESSION: 1. No evidence of acute intracranial abnormality. 2. Severe paranasal sinus disease. Electronically Signed   By: Gilmore GORMAN Molt M.D.   On: 12/24/2023 15:16   DG Chest Portable 1 View Result Date: 12/24/2023 EXAM: 1 VIEW XRAY OF THE CHEST 12/24/2023 01:04:31 PM COMPARISON: 12/15/2023 CLINICAL HISTORY: Fever, weakness, ? pneumonia. FINDINGS: LUNGS AND PLEURA: Low lung volumes. New left basilar patchy opacities. No pleural effusion. No pneumothorax. HEART AND MEDIASTINUM: No acute abnormality of the cardiac and mediastinal silhouettes. BONES AND SOFT TISSUES: Right upper extremity vascular stent noted. No acute osseous abnormality. IMPRESSION: 1. New left basilar patchy opacities, concerning for pneumonia. 2. Low lung volumes. Electronically signed by: Taylor Stroud MD  12/24/2023 01:23 PM EDT RP Workstation: [id number]     Medications:    cefTRIAXone  (ROCEPHIN )  IV 2 g (12/24/23 1721)    atorvastatin   20 mg Oral Daily   azithromycin   500 mg Oral q1800   calcium  acetate  1,334 mg Oral TID WC   fluticasone   2 spray Each Nare Daily   folic acid   1 mg Oral Daily   heparin   5,000 Units Subcutaneous Q8H   leptospermum manuka honey  1 Application Topical Daily   levothyroxine   187.5 mcg Oral Q Sun   lipase/protease/amylase  12,000 Units Oral TID WC   loratadine   10 mg  Oral Daily   melatonin  5 mg Oral QHS   midodrine   2.5 mg Oral Q M,W,F   multivitamin  1 tablet Oral QHS   pantoprazole   40 mg Oral Daily   zinc  sulfate (50mg  elemental zinc )  220 mg Oral Daily   acetaminophen , albuterol , hydrOXYzine , ondansetron  **OR** ondansetron  (ZOFRAN ) IV, polyethylene glycol  Assessment/ Plan:  [name] is a 58 y.o.  female past medical history of end-stage renal disease on hemodialysis Monday Wednesday Friday schedule, ANCA related glomerulonephritis, diet-controlled diabetes, orthostatic hypotension and thrombocytopenia and CLL now admitted with complaints on generalized weakness and inability to care for herself. She is now being treated with antibiotics for pneumonia.   [facility]/MWF/Rt AVF   Generalized weakness in the setting of ESRD on hemodialysis: Next treatment scheduled for Monday. Patient offered rehab bed in [place], dialysis navigator will seek outpatient dialysis clinic near that area.   ANEMIA with chronic kidney disease: Hgb 7.2. Avoiding ESA due to CLL.   Secondary Hyperparathyroidism: with outpatient labs: PTH 568, phosphorus 5.1, calcium  9.0 on 11/28/23. Currently prescribed calcitriol and calcium  acetate outpatient.    4. Hypertension with chronic kidney disease. Home regimen includes midodrine , currently held. Receiving hydrochlorothiazide  during this admission. Blood pressure stable today, 116/70   5. Diabetes  mellitus type II with chronic kidney disease/renal manifestations: noninsulin dependent. Most recent hemoglobin A1c is 5.6 on 07/29/23.   Diet controlled      LOS: 0 Tonya Myers 8/31/202510:08 AM

## 2023-12-25 NOTE — TOC Progression Note (Signed)
 Transition of Care [facility]) - Progression Note    Patient Details  Name: Tonya Myers MRN: [id number] Date of Birth: 09-19-1965  Transition of Care [facility]) [name] Contact  Marinda Cooks, RN Phone Number: 12/25/2023, 8:24 AM  Clinical Narrative:    This CM informed by Lenis Satchel) [facility] POC for [facility] was denied by the DO to transfer to another [facility] clinic. This CM sent new bed referral to [facility] for review due to their Dialysis Component . TOC will cont to follow dc planning / care coordination and update as applicable.                     Expected Discharge Plan and Services                                               Social Drivers of Health (SDOH) Interventions SDOH Screenings   Food Insecurity: No Food Insecurity (12/11/2023)  Housing: Low Risk  (12/11/2023)  Transportation Needs: Unmet Transportation Needs (12/11/2023)  Utilities: Not At Risk (12/11/2023)  Depression (PHQ2-9): Low Risk  (12/06/2023)  Financial Resource Strain: High Risk (07/06/2023)   Received from [facility]  Social Connections: Unknown (08/12/2023)  Tobacco Use: Low Risk  (12/16/2023)    Readmission Risk Interventions    12/24/2021   12:03 PM  Readmission Risk Prevention Plan  Transportation Screening Complete  Medication Review (RN Care Manager) Complete  PCP or Specialist appointment within 3-5 days of discharge Complete  HRI or Home Care Consult Complete  SW Recovery Care/Counseling Consult Complete  Palliative Care Screening Not Applicable  Skilled Nursing Facility Complete

## 2023-12-25 NOTE — Consult Note (Signed)
 WOC Nurse Consult Note: Reason for Consult: pressure injury  Wound type: Stage 3 Pressure Injury sacrum and coccyx  Pressure Injury POA: no  Measurement: see nursing flowsheet  Wound bed:75% tan 25% red  Drainage (amount, consistency, odor) see nursing flowsheet  Periwound:  Dressing procedure/placement/frequency: Cleanse sacral/coccyx/buttock wounds with Vashe wound cleanser Soila [phone]) do not rinse and allow to air dry. Apply Medihoney to wound beds daily, cover with dry gauze and secure with silicone foam or ABD pad and tape whichever is preferred.   POC discussed with bedside nurse. WOC team will follow every 7 to 10 days to assess area and change POC as needed.   Thank you,    Powell Bar MSN, RN-BC, [employer]

## 2023-12-26 DIAGNOSIS — R404 Transient alteration of awareness: Secondary | ICD-10-CM | POA: Diagnosis not present

## 2023-12-26 DIAGNOSIS — N186 End stage renal disease: Secondary | ICD-10-CM | POA: Diagnosis not present

## 2023-12-26 LAB — BASIC METABOLIC PANEL WITH GFR
Anion gap: 20 — ABNORMAL HIGH (ref 5–15)
BUN: 71 mg/dL — ABNORMAL HIGH (ref 6–20)
CO2: 24 mmol/L (ref 22–32)
Calcium: 7.9 mg/dL — ABNORMAL LOW (ref 8.9–10.3)
Chloride: 95 mmol/L — ABNORMAL LOW (ref 98–111)
Creatinine, Ser: 10.24 mg/dL — ABNORMAL HIGH (ref 0.44–1.00)
GFR, Estimated: 4 mL/min — ABNORMAL LOW (ref >60)
Glucose, Bld: 153 mg/dL — ABNORMAL HIGH (ref 70–99)
Potassium: 4.6 mmol/L (ref 3.5–5.1)
Sodium: 139 mmol/L (ref 135–145)

## 2023-12-26 LAB — BLOOD GAS, VENOUS
Bicarbonate: 29.9 mmol/L — ABNORMAL HIGH (ref 20.0–28.0)
Patient temperature: 37 mmol/L — AB (ref 0.0–2.0)
pCO2, Ven: 35 mmHg — ABNORMAL LOW (ref 44–60)
pH, Ven: 7.54 — ABNORMAL HIGH (ref 7.25–7.43)
pO2, Ven: 29.9 mmol/L — AB (ref 32–45)

## 2023-12-26 LAB — CBC
HCT: 24.7 % — ABNORMAL LOW (ref 36.0–46.0)
Hemoglobin: 8.3 g/dL — ABNORMAL LOW (ref 12.0–15.0)
MCH: 32 pg (ref 26.0–34.0)
MCHC: 33.6 g/dL (ref 30.0–36.0)
MCV: 95.4 fL (ref 80.0–100.0)
Platelets: 56 K/uL — ABNORMAL LOW (ref 150–400)
RBC: 2.59 MIL/uL — ABNORMAL LOW (ref 3.87–5.11)
RDW: 19.7 % — ABNORMAL HIGH (ref 11.5–15.5)
WBC: 3.9 K/uL — ABNORMAL LOW (ref 4.0–10.5)
nRBC: 0 % (ref 0.0–0.2)

## 2023-12-26 LAB — MAGNESIUM: Magnesium: 2.3 mg/dL (ref 1.7–2.4)

## 2023-12-26 LAB — GLUCOSE, CAPILLARY: Glucose-Capillary: 164 mg/dL — ABNORMAL HIGH (ref 70–99)

## 2023-12-26 MED ORDER — FENTANYL CITRATE PF 50 MCG/ML IJ SOSY: 50.0000 ug | PREFILLED_SYRINGE | Freq: Once | INTRAMUSCULAR | Status: DC

## 2023-12-26 MED ORDER — MIDODRINE HCL 5 MG PO TABS
ORAL_TABLET | ORAL | Status: AC
Start: 2023-12-26 — End: 2023-12-26
  Filled 2023-12-26: qty 1

## 2023-12-26 MED ORDER — MIDODRINE HCL 5 MG PO TABS
5.0000 mg | ORAL_TABLET | Freq: Once | ORAL | Status: AC
  Administered 2023-12-26: 5 mg via ORAL

## 2023-12-26 MED ORDER — MIDODRINE HCL 5 MG PO TABS
5.0000 mg | ORAL_TABLET | Freq: Three times a day (TID) | ORAL | Status: DC
  Administered 2023-12-27 – 2023-12-30 (×8): 5 mg via ORAL
  Filled 2023-12-26 (×8): qty 1

## 2023-12-26 MED ORDER — MIDODRINE HCL 5 MG PO TABS
ORAL_TABLET | ORAL | Status: AC
  Filled 2023-12-26: qty 1

## 2023-12-26 MED ORDER — PENTAFLUOROPROP-TETRAFLUOROETH EX AERO
INHALATION_SPRAY | CUTANEOUS | Status: AC
  Filled 2023-12-26: qty 30

## 2023-12-26 NOTE — Progress Notes (Signed)
 [facility]  ROUNDING NOTE   Subjective:   Patient has been admitted under observation for Physical deconditioning [R53.81] Failure to thrive in adult [R62.7] Chronic kidney disease on chronic dialysis (HCC) [N18.6, Z99.2] Community acquired pneumonia of left lower lobe of lung [J18.9] AMS (altered mental status) [R41.82]  Patient seen and evaluated during dialysis.    HEMODIALYSIS FLOWSHEET:  Blood Flow Rate (mL/min): 400 mL/min Arterial Pressure (mmHg): -170 mmHg Venous Pressure (mmHg): 220 mmHg TMP (mmHg): 3 mmHg Ultrafiltration Rate (mL/min): 543 mL/min Dialysate Flow Rate (mL/min): 300 ml/min Dialysis Fluid Bolus: Normal Saline Bolus Amount (mL): 100 mL  Complaining of back and abd pain States she's dehydrated, requesting water Shivers noted  Objective:  Vital signs in last 24 hours:  Temp:  [98.5 F (36.9 C)-101.4 F (38.6 C)] 98.5 F (36.9 C) (09/01 0800) Pulse Rate:  [98-110] 99 (09/01 0943) Resp:  [11-21] 21 (09/01 0943) BP: (101-122)/(58-89) 113/69 (09/01 0943) SpO2:  [67 %-100 %] 67 % (09/01 0943)  Weight change:  Filed Weights   12/21/23 0730 12/21/23 1222 12/23/23 0810  Weight: 88.3 kg 88.3 kg 89.5 kg    Intake/Output: I/O last 3 completed shifts: In: 240 [P.O.:240] Out: -    Intake/Output this shift:  No intake/output data recorded.  Physical Exam: General: Ill appearing, shivering  Head: Normocephalic  Eyes: Anicteric  Lungs:  Clear to auscultation, normal effort  Heart: Regular rate   Abdomen:  Soft, nontender  Extremities:  No peripheral edema.  Neurologic: Alert, awake  Skin: No lesions  Access: Rt AVF    Basic Metabolic Panel: Recent Labs  Lab 12/21/23 0825 12/23/23 0818 12/24/23 1341 12/25/23 0454 12/26/23 0544  NA 136 138 135 141 139  K 5.1 4.9 4.5 4.5 4.6  CL 94* 94* 95* 97* 95*  CO2 26 25 26 26 24   GLUCOSE 162* 116* 99 124* 153*  BUN 60* 58* 43* 55* 71*  CREATININE 8.50* 8.67* 6.61* 7.98* 10.24*   CALCIUM  8.2* 8.3* 8.2* 8.0* 7.9*  MG  --   --   --   --  2.3  PHOS 4.7* 5.2*  --   --   --     Liver Function Tests: Recent Labs  Lab 12/21/23 0825 12/23/23 0818 12/24/23 1341 12/25/23 0454  AST  --   --  118* 105*  ALT  --   --  26 23  ALKPHOS  --   --  55 50  BILITOT  --   --  1.2 0.9  PROT  --   --  7.3 6.3*  ALBUMIN  2.5* 2.5* 2.6* 2.5*   No results for input(s): LIPASE, AMYLASE in the last 168 hours.  Recent Labs  Lab 12/24/23 1341  AMMONIA 15    CBC: Recent Labs  Lab 12/21/23 0855 12/23/23 0818 12/24/23 1341 12/25/23 0454 12/26/23 0544  WBC 4.9 3.8* 3.8* 3.7* 3.9*  NEUTROABS  --  3.1 3.8  --   --   HGB 8.5* 7.9* 9.0* 7.2* 8.3*  HCT 24.5* 22.5* 29.5* 22.7* 24.7*  MCV 96.5 99.1 92.5 93.0 95.4  PLT 47* 51* 53* 51* 56*    Cardiac Enzymes: Recent Labs  Lab 12/24/23 1341  CKTOTAL 445*    BNP: Invalid input(s): POCBNP  CBG: Recent Labs  Lab 12/24/23 1335  GLUCAP 103*     Microbiology: Results for orders placed or performed during the hospital encounter of 12/16/23  Resp panel by RT-PCR (RSV, Flu A&B, Covid) Anterior Nasal Swab  Status: None   Collection Time: 12/19/23  4:21 PM   Specimen: Anterior Nasal Swab  Result Value Ref Range Status   SARS Coronavirus 2 by RT PCR NEGATIVE NEGATIVE Final    Comment: (NOTE) SARS-CoV-2 target nucleic acids are NOT DETECTED.  The SARS-CoV-2 RNA is generally detectable in upper respiratory specimens during the acute phase of infection. The lowest concentration of SARS-CoV-2 viral copies this assay can detect is 138 copies/mL. A negative result does not preclude SARS-Cov-2 infection and should not be used as the sole basis for treatment or other patient management decisions. A negative result may occur with  improper specimen collection/handling, submission of specimen other than nasopharyngeal swab, presence of viral mutation(s) within the areas targeted by this assay, and inadequate number of  viral copies(<138 copies/mL). A negative result must be combined with clinical observations, patient history, and epidemiological information. The expected result is Negative.  Fact Sheet for Patients:  BloggerCourse.com  Fact Sheet for Healthcare Providers:  SeriousBroker.it  This test is no t yet approved or cleared by the [place]  FDA and  has been authorized for detection and/or diagnosis of SARS-CoV-2 by FDA under an Emergency Use Authorization (EUA). This EUA will remain  in effect (meaning this test can be used) for the duration of the COVID-19 declaration under Section 564(b)(1) of the Act, 21 U.S.C.section 360bbb-3(b)(1), unless the authorization is terminated  or revoked sooner.       Influenza A by PCR NEGATIVE NEGATIVE Final   Influenza B by PCR NEGATIVE NEGATIVE Final    Comment: (NOTE) The Xpert Xpress SARS-CoV-2/FLU/RSV plus assay is intended as an aid in the diagnosis of influenza from Nasopharyngeal swab specimens and should not be used as a sole basis for treatment. Nasal washings and aspirates are unacceptable for Xpert Xpress SARS-CoV-2/FLU/RSV testing.  Fact Sheet for Patients: BloggerCourse.com  Fact Sheet for Healthcare Providers: SeriousBroker.it  This test is not yet approved or cleared by the [place]  FDA and has been authorized for detection and/or diagnosis of SARS-CoV-2 by FDA under an Emergency Use Authorization (EUA). This EUA will remain in effect (meaning this test can be used) for the duration of the COVID-19 declaration under Section 564(b)(1) of the Act, 21 U.S.C. section 360bbb-3(b)(1), unless the authorization is terminated or revoked.     Resp Syncytial Virus by PCR NEGATIVE NEGATIVE Final    Comment: (NOTE) Fact Sheet for Patients: BloggerCourse.com  Fact Sheet for Healthcare  Providers: SeriousBroker.it  This test is not yet approved or cleared by the [place]  FDA and has been authorized for detection and/or diagnosis of SARS-CoV-2 by FDA under an Emergency Use Authorization (EUA). This EUA will remain in effect (meaning this test can be used) for the duration of the COVID-19 declaration under Section 564(b)(1) of the Act, 21 U.S.C. section 360bbb-3(b)(1), unless the authorization is terminated or revoked.  Performed at [facility], [address] [place], [place] [address]   Group A Strep by PCR [facility] Only)     Status: None   Collection Time: 12/19/23  4:21 PM   Specimen: Anterior Nasal Swab; Sterile Swab  Result Value Ref Range Status   Group A Strep by PCR NOT DETECTED NOT DETECTED Final    Comment: Performed at [facility], [address] [place], [place] [address]  Resp panel by RT-PCR (RSV, Flu A&B, Covid) Anterior Nasal Swab     Status: None   Collection Time: 12/24/23  1:39 PM   Specimen: Anterior Nasal Swab  Result Value Ref Range Status   SARS Coronavirus 2 by RT PCR NEGATIVE NEGATIVE Final    Comment: (NOTE) SARS-CoV-2 target nucleic acids are NOT DETECTED.  The SARS-CoV-2 RNA is generally detectable in upper respiratory specimens during the acute phase of infection. The lowest concentration of SARS-CoV-2 viral copies this assay can detect is 138 copies/mL. A negative result does not preclude SARS-Cov-2 infection and should not be used as the sole basis for treatment or other patient management decisions. A negative result may occur with  improper specimen collection/handling, submission of specimen other than nasopharyngeal swab, presence of viral mutation(s) within the areas targeted by this assay, and inadequate number of viral copies(<138 copies/mL). A negative result must be combined with clinical observations, patient history, and epidemiological information. The expected result  is Negative.  Fact Sheet for Patients:  BloggerCourse.com  Fact Sheet for Healthcare Providers:  SeriousBroker.it  This test is no t yet approved or cleared by the [place]  FDA and  has been authorized for detection and/or diagnosis of SARS-CoV-2 by FDA under an Emergency Use Authorization (EUA). This EUA will remain  in effect (meaning this test can be used) for the duration of the COVID-19 declaration under Section 564(b)(1) of the Act, 21 U.S.C.section 360bbb-3(b)(1), unless the authorization is terminated  or revoked sooner.       Influenza A by PCR NEGATIVE NEGATIVE Final   Influenza B by PCR NEGATIVE NEGATIVE Final    Comment: (NOTE) The Xpert Xpress SARS-CoV-2/FLU/RSV plus assay is intended as an aid in the diagnosis of influenza from Nasopharyngeal swab specimens and should not be used as a sole basis for treatment. Nasal washings and aspirates are unacceptable for Xpert Xpress SARS-CoV-2/FLU/RSV testing.  Fact Sheet for Patients: BloggerCourse.com  Fact Sheet for Healthcare Providers: SeriousBroker.it  This test is not yet approved or cleared by the [place]  FDA and has been authorized for detection and/or diagnosis of SARS-CoV-2 by FDA under an Emergency Use Authorization (EUA). This EUA will remain in effect (meaning this test can be used) for the duration of the COVID-19 declaration under Section 564(b)(1) of the Act, 21 U.S.C. section 360bbb-3(b)(1), unless the authorization is terminated or revoked.     Resp Syncytial Virus by PCR NEGATIVE NEGATIVE Final    Comment: (NOTE) Fact Sheet for Patients: BloggerCourse.com  Fact Sheet for Healthcare Providers: SeriousBroker.it  This test is not yet approved or cleared by the [place]  FDA and has been authorized for detection and/or diagnosis of  SARS-CoV-2 by FDA under an Emergency Use Authorization (EUA). This EUA will remain in effect (meaning this test can be used) for the duration of the COVID-19 declaration under Section 564(b)(1) of the Act, 21 U.S.C. section 360bbb-3(b)(1), unless the authorization is terminated or revoked.  Performed at [facility], [address] [place], [place] [address]   Blood Culture (routine x 2)     Status: None (Preliminary result)   Collection Time: 12/24/23  1:41 PM   Specimen: BLOOD  Result Value Ref Range Status   Specimen Description BLOOD BLOOD LEFT FOREARM  Final   Special Requests   Final    BOTTLES DRAWN AEROBIC AND ANAEROBIC Blood Culture adequate volume   Culture   Final    NO GROWTH 2 DAYS Performed at [facility], [address] [place], [place] [address]    Report Status PENDING  Incomplete  Blood Culture (routine x 2)     Status: None (Preliminary result)   Collection Time: 12/24/23  1:41 PM   Specimen: BLOOD LEFT HAND  Result Value Ref Range Status   Specimen Description BLOOD LEFT HAND  Final   Special Requests   Final    BOTTLES DRAWN AEROBIC AND ANAEROBIC Blood Culture results may not be optimal due to an inadequate volume of blood received in culture bottles   Culture   Final    NO GROWTH 2 DAYS Performed at [facility], [address], [place], [place] [address]    Report Status PENDING  Incomplete  MRSA Next Gen by PCR, Nasal     Status: Abnormal   Collection Time: 12/24/23  5:49 PM   Specimen: Nasal Mucosa; Nasal Swab  Result Value Ref Range Status   MRSA by PCR Next Gen DETECTED (A) NOT DETECTED Final    Comment: RESULT CALLED TO, READ BACK BY AND VERIFIED WITH: AMY MCCRAY RN 12/24/23 1937 KG (NOTE) The GeneXpert MRSA Assay (FDA approved for NASAL specimens only), is one component of a comprehensive MRSA colonization surveillance program. It is not intended to diagnose MRSA infection nor to guide or monitor treatment for  MRSA infections. Test performance is not FDA approved in patients less than 27 years old. Performed at [facility], [address] [place], [place] [address]   Respiratory (~20 pathogens) panel by PCR     Status: None   Collection Time: 12/25/23  7:20 PM   Specimen: Nasopharyngeal Swab; Respiratory  Result Value Ref Range Status   Adenovirus NOT DETECTED NOT DETECTED Final   Coronavirus 229E NOT DETECTED NOT DETECTED Final    Comment: (NOTE) The Coronavirus on the Respiratory Panel, DOES NOT test for the novel  Coronavirus (2019 nCoV)    Coronavirus HKU1 NOT DETECTED NOT DETECTED Final   Coronavirus NL63 NOT DETECTED NOT DETECTED Final   Coronavirus OC43 NOT DETECTED NOT DETECTED Final   Metapneumovirus NOT DETECTED NOT DETECTED Final   Rhinovirus / Enterovirus NOT DETECTED NOT DETECTED Final   Influenza A NOT DETECTED NOT DETECTED Final   Influenza B NOT DETECTED NOT DETECTED Final   Parainfluenza Virus 1 NOT DETECTED NOT DETECTED Final   Parainfluenza Virus 2 NOT DETECTED NOT DETECTED Final   Parainfluenza Virus 3 NOT DETECTED NOT DETECTED Final   Parainfluenza Virus 4 NOT DETECTED NOT DETECTED Final   Respiratory Syncytial Virus NOT DETECTED NOT DETECTED Final   Bordetella pertussis NOT DETECTED NOT DETECTED Final   Bordetella Parapertussis NOT DETECTED NOT DETECTED Final   Chlamydophila pneumoniae NOT DETECTED NOT DETECTED Final   Mycoplasma pneumoniae NOT DETECTED NOT DETECTED Final    Comment: Performed at [facility], [address], [place], [place] [address]    Coagulation Studies: Recent Labs    12/24/23 1341 12/25/23 0454  LABPROT 14.9 15.4*  INR 1.1 1.2    Urinalysis: Recent Labs    12/24/23 1557  COLORURINE AMBER*  LABSPEC 1.019  PHURINE 7.0  GLUCOSEU NEGATIVE  HGBUR SMALL*  BILIRUBINUR NEGATIVE  KETONESUR NEGATIVE  PROTEINUR 100*  NITRITE NEGATIVE  LEUKOCYTESUR MODERATE*      Imaging: DG Abd 1 View Result Date:  12/25/2023 CLINICAL DATA:  Abdominal pain. EXAM: ABDOMEN - 1 VIEW COMPARISON:  None Available. FINDINGS: Normal bowel gas pattern. There are calcifications in the left upper quadrant, corresponding of the pancreas. Lung bases are clear. IMPRESSION: 1. No acute findings. 2. Chronic calcific pancreatitis. Electronically Signed   By: Newell Eke M.D.   On: 12/25/2023 14:18   CT Head Wo Contrast Result Date:  12/24/2023 CLINICAL DATA:  Delirium EXAM: CT HEAD WITHOUT CONTRAST TECHNIQUE: Contiguous axial images were obtained from the base of the skull through the vertex without intravenous contrast. RADIATION DOSE REDUCTION: This exam was performed according to the departmental dose-optimization program which includes automated exposure control, adjustment of the mA and/or kV according to patient size and/or use of iterative reconstruction technique. COMPARISON:  CT head 03/28/2023. FINDINGS: Mild to moderately motion limited study.  Within this limitation: Brain: No evidence of acute infarction, hemorrhage, hydrocephalus, extra-axial collection or mass lesion/mass effect. Vascular: No hyperdense vessel. Skull: No acute fracture. Sinuses/Orbits: Severe paranasal sinus disease with partial opacification of all sinuses. Other: No mastoid effusions. IMPRESSION: 1. No evidence of acute intracranial abnormality. 2. Severe paranasal sinus disease. Electronically Signed   By: Gilmore GORMAN Molt M.D.   On: 12/24/2023 15:16   DG Chest Portable 1 View Result Date: 12/24/2023 EXAM: 1 VIEW XRAY OF THE CHEST 12/24/2023 01:04:31 PM COMPARISON: 12/15/2023 CLINICAL HISTORY: Fever, weakness, ? pneumonia. FINDINGS: LUNGS AND PLEURA: Low lung volumes. New left basilar patchy opacities. No pleural effusion. No pneumothorax. HEART AND MEDIASTINUM: No acute abnormality of the cardiac and mediastinal silhouettes. BONES AND SOFT TISSUES: Right upper extremity vascular stent noted. No acute osseous abnormality. IMPRESSION: 1. New left  basilar patchy opacities, concerning for pneumonia. 2. Low lung volumes. Electronically signed by: Taylor Stroud MD 12/24/2023 01:23 PM EDT RP Workstation: [id number]     Medications:      atorvastatin   20 mg Oral Daily   calcium  acetate  1,334 mg Oral TID WC   fluticasone   2 spray Each Nare Daily   folic acid   1 mg Oral Daily   heparin   5,000 Units Subcutaneous Q8H   leptospermum manuka honey  1 Application Topical Daily   levothyroxine   187.5 mcg Oral Q Sun   lipase/protease/amylase  12,000 Units Oral TID WC   loratadine   10 mg Oral Daily   melatonin  5 mg Oral QHS   midodrine   2.5 mg Oral Q M,W,F   multivitamin  1 tablet Oral QHS   pantoprazole   40 mg Oral Daily   zinc  sulfate (50mg  elemental zinc )  220 mg Oral Daily   acetaminophen , albuterol , alum & mag hydroxide-simeth, calcium  carbonate, hydrOXYzine , ondansetron  **OR** ondansetron  (ZOFRAN ) IV, polyethylene glycol  Assessment/ Plan:  Tonya Myers is a 58 y.o.  female past medical history of end-stage renal disease on hemodialysis Monday Wednesday Friday schedule, ANCA related glomerulonephritis, diet-controlled diabetes, orthostatic hypotension and thrombocytopenia and CLL now admitted with complaints on generalized weakness and inability to care for herself. She is now being treated with antibiotics for pneumonia.   [facility] [place]/MWF/Rt AVF   Generalized weakness in the setting of ESRD on hemodialysis: Receiving dialysis today, UF 1L as tolerated. Opioids held due to changes in mentation. Afebrile at treatment initiation. Next treatment scheduled for Wednesday.    ANEMIA with chronic kidney disease: Hgb 8.3. Avoiding ESA due to CLL.   Secondary Hyperparathyroidism: with outpatient labs: PTH 568, phosphorus 5.1, calcium  9.0 on 11/28/23. Currently prescribed calcitriol and calcium  acetate outpatient. Binders resumed wit meals.    4. Hypertension with chronic kidney disease. Home regimen includes midodrine ,  currently held. Receiving hydrochlorothiazide  during this admission. Blood pressure 113/69 during dialysis   5. 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 Most recent hemoglobin A1c is 5.6 on 07/29/23.   Diet controlled      LOS: 1 Hasten Sweitzer 9/1/20259:50 AM

## 2023-12-26 NOTE — Progress Notes (Signed)
 IVT requested to come to bedside to assess for PIV placement.  Previous IVT member unable to find suitable options.  Assessed LUE with and w/o US . Attempt to upper arm x 1 without success. Primary RN and MD aware.

## 2023-12-26 NOTE — Plan of Care (Signed)
   Problem: Clinical Measurements: Goal: Ability to maintain a body temperature in the normal range will improve Outcome: Progressing   Problem: Respiratory: Goal: Ability to maintain adequate ventilation will improve Outcome: Progressing Goal: Ability to maintain a clear airway will improve Outcome: Progressing

## 2023-12-26 NOTE — TOC Progression Note (Signed)
 Transition of Care [facility]) - Progression Note    Patient Details  Name: Tonya Myers MRN: [id number] Date of Birth: 11-12-1965  Transition of Care [facility]) [name] Contact  [place] E[place], [place] Phone Number: 12/26/2023, 9:38 AM  Clinical Narrative:     Email received from Suzen Satchel:    [name] was denied by the DO to transfer to another [facility] clinic.  Did they try [facility] by any chance?                      Expected Discharge Plan and Services                                               Social Drivers of Health (SDOH) Interventions SDOH Screenings   Food Insecurity: No Food Insecurity (12/11/2023)  Housing: Low Risk  (12/11/2023)  Transportation Needs: Unmet Transportation Needs (12/11/2023)  Utilities: Not At Risk (12/11/2023)  Depression (PHQ2-9): Low Risk  (12/06/2023)  Financial Resource Strain: High Risk (07/06/2023)   Received from [facility]  Social Connections: Unknown (08/12/2023)  Tobacco Use: Low Risk  (12/16/2023)    Readmission Risk Interventions    12/24/2021   12:03 PM  Readmission Risk Prevention Plan  Transportation Screening Complete  Medication Review (RN Care Manager) Complete  PCP or Specialist appointment within 3-5 days of discharge Complete  HRI or Home Care Consult Complete  SW Recovery Care/Counseling Consult Complete  Palliative Care Screening Not Applicable  Skilled Nursing Facility Complete

## 2023-12-26 NOTE — Progress Notes (Addendum)
 PROGRESS NOTE    Tonya Myers  FMW:[id number] DOB: [birth year] DOA: 12/16/2023 PCP: Valora Agent, MD  [id number]  LOS: 1 day   Brief hospital course:   Assessment & Plan: Tonya Myers is a pleasant 58 y.o. female with medical history significant for ESRD on hemodialysis, CLL in remission, thrombocytopenia, history of strep pneumonia meningitis, s/p multilevel laminectomy, HLD, HTN, DM, depression, anxiety, hypothyroidism, recent treatment of pneumonia and discharged home on 12/13/2023 and came to emergency room on 12/15/2023, who was found to have acute change in mental status today and hospitalist service was consulted for admission.  On 12/15/2023 patient came back from home concerning for generalized pain and weakness not able to function at home.  That time patient was seen by hospitalist in the ED and advised that patient does not need admission as there is no acute issues.  Patient's vital signs at the time was temperature 98.1, respiration 16 heart rate 86, blood pressure 113/90, SpO2 98% on room air. Today, went ED physician was rounding this patient, patient had a low-grade temperature, hypotension and acute change in mental status.  Nephrology seen the patient and advised for starting 250 cc bolus of normal saline, small dose of midodrine  for hypotension, chest x-ray reported by radiologist for possible infiltrate.  Etiology of low-grade fevers uncertain but history of recent pneumonia, hemodialysis, bacteremia certainly a consideration.  Hospitalist service was consulted for evaluation for admission for acute change in mental status and possible concern for sepsis.   Acute change in mental status --etiology unclear at this time.  has had fluctuating altered mental status for the past 3 years.  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  She has had some discordant increase in protein.  She had been followed by neurology in the past.  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 --On 12/11/2023 ANA was positive again  and double-stranded DNA was elevated at >300 which is extremely high compared to 11 in 2022.  --neuro consult inpatient for concern of lupus cerebritis  --consider Rheum consult for autoimmune condition.  Sepsis ruled out --no source of infection currently  PNA, ruled out - Chest x-ray showed New left basilar patchy opacities, however, no symptoms of PNA. 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 --iHD per nephro   HTN, not currently active  hypotension --increase midodrine  to 5 mg TID   DM --last A1c 5.4.   HLD - Continue on Lipitor 20 mg daily   Hypothyroidism - Continue on levothyroxine    GERD - Continue Protonix   Chronic Anemia  --monitor  Hx of ITP Chronic thrombocytopenia --last saw Onc Dr. Jacobo on 11/25/23.  Hx of CLL --in complete remission, last saw Onc Dr. Jacobo on 11/25/23.   DVT prophylaxis: Heparin  SQ Code Status: DNR  Family Communication: daughter updated on the phone today Level of care: Med-Surg Dispo:   The patient is from: home Anticipated d/c is to: SNF rehab Anticipated d/c date is: 1-2 days   Subjective and Interval History:  Pt denied abdominal pain during rounds today.  Was hypotensive during dialysis.   Objective: Vitals:   12/26/23 1232  12/26/23 1245 12/26/23 1325 12/26/23 1325  BP: (!) 107/53 107/62  (!) 97/56  Pulse: (!) 109   97  Resp: (!) 23 (!) 26  16  Temp:  98.4 F (36.9 C)  98.3 F (36.8 C)  TempSrc:  Oral  Oral  SpO2:  98%  94%  Weight:   88.3 kg   Height:        Intake/Output Summary (Last  24 hours) at 12/26/2023 1551 Last data filed at 12/25/2023 2300 Gross per 24 hour  Intake 240 ml  Output --  Net 240 ml   Filed Weights   12/23/23 0810 12/26/23 0805 12/26/23 1325  Weight: 89.5 kg 87.3 kg 88.3 kg    Examination:   Constitutional: NAD, AAOx3 HEENT: conjunctivae and lids normal, EOMI CV: No cyanosis.   RESP: normal respiratory effort, on RA Neuro: II - XII grossly intact.     Data Reviewed: I have personally reviewed labs and imaging studies  Time spent: 50 minutes  [name], MD [facility] If 7PM-7AM, please contact night-coverage 12/26/2023, 3:51 PM

## 2023-12-26 NOTE — Plan of Care (Signed)
   Problem: Activity: Goal: Ability to tolerate increased activity will improve Outcome: Progressing   Problem: Clinical Measurements: Goal: Ability to maintain a body temperature in the normal range will improve Outcome: Progressing   Problem: Respiratory: Goal: Ability to maintain adequate ventilation will improve Outcome: Progressing Goal: Ability to maintain a clear airway will improve Outcome: Progressing   Problem: Education: Goal: Knowledge of General Education information will improve Description: Including pain rating scale, medication(s)/side effects and non-pharmacologic comfort measures Outcome: Progressing   Problem: Health Behavior/Discharge Planning: Goal: Ability to manage health-related needs will improve Outcome: Progressing   Problem: Clinical Measurements: Goal: Ability to maintain clinical measurements within normal limits will improve Outcome: Progressing Goal: Will remain free from infection Outcome: Progressing Goal: Diagnostic test results will improve Outcome: Progressing Goal: Respiratory complications will improve Outcome: Progressing Goal: Cardiovascular complication will be avoided Outcome: Progressing   Problem: Activity: Goal: Risk for activity intolerance will decrease Outcome: Progressing   Problem: Nutrition: Goal: Adequate nutrition will be maintained Outcome: Progressing   Problem: Coping: Goal: Level of anxiety will decrease Outcome: Progressing   Problem: Elimination: Goal: Will not experience complications related to bowel motility Outcome: Progressing Goal: Will not experience complications related to urinary retention Outcome: Progressing   Problem: Pain Managment: Goal: General experience of comfort will improve and/or be controlled Outcome: Progressing   Problem: Safety: Goal: Ability to remain free from injury will improve Outcome: Progressing   Problem: Skin Integrity: Goal: Risk for impaired skin integrity  will decrease Outcome: Progressing

## 2023-12-26 NOTE — Procedures (Signed)
 Central Venous Catheter Insertion Procedure Note  Tonya Myers  [id number]  21-Nov-1965  Date:12/26/23  Time:8:33 PM   Provider Performing:Marsean Elkhatib L Rust-Chester   Procedure: Insertion of Non-tunneled Central Venous [phone]) with US  guidance (23062)   Indication(s) Difficult access  Consent Risks of the procedure as well as the alternatives and risks of each were explained to the patient and/or caregiver.  Consent for the procedure was obtained and is signed in the bedside chart  Anesthesia Topical only with 1% lidocaine    Timeout Verified patient identification, verified procedure, site/side was marked, verified correct patient position, special equipment/implants available, medications/allergies/relevant history reviewed, required imaging and test results available.  Sterile Technique Maximal sterile technique including full sterile barrier drape, hand hygiene, sterile gown, sterile gloves, mask, hair covering, sterile ultrasound probe cover (if used).  Procedure Description Area of catheter insertion was cleaned with chlorhexidine  and draped in sterile fashion.  With real-time ultrasound guidance a central venous catheter was placed into the left femoral vein. Nonpulsatile blood flow and easy flushing noted in all ports.  The catheter was sutured in place and sterile dressing applied.  Complications/Tolerance None; patient tolerated the procedure well. Chest X-ray is ordered to verify placement for internal jugular or subclavian cannulation.   Chest x-ray is not ordered for femoral cannulation.  EBL Minimal  Specimen(s) None  Consulted due to lack of IV access and inability to obtain peripheral access by IV team. Attempted Left internal jugular access, however the patient's anatomy and mentation were not amenable to this site. Left femoral CVC access obtained without complication.  Jenita Ruth Rust-Chester, AGACNP-BC Acute Care Nurse Practitioner [place]  [facility]   [phone] / [phone] Please see Amion for details.

## 2023-12-26 NOTE — Procedures (Signed)
 Received patient in bed to unit.  Alert and oriented.  Informed consent signed and in chart.   TX duration:2.5hrs Patient was hypotensive.  Transported back to the room  Alert, without acute distress.  Hand-off given to patient's nurse.   Access used: Right upper arm AVG.  Access issues: Long bleeding time.   Total UF removed: +1356ml Medication(s) given: Midodrine .     [facility]

## 2023-12-27 DIAGNOSIS — F439 Reaction to severe stress, unspecified: Secondary | ICD-10-CM

## 2023-12-27 DIAGNOSIS — R509 Fever, unspecified: Secondary | ICD-10-CM | POA: Diagnosis not present

## 2023-12-27 DIAGNOSIS — R404 Transient alteration of awareness: Secondary | ICD-10-CM | POA: Diagnosis not present

## 2023-12-27 DIAGNOSIS — R4182 Altered mental status, unspecified: Secondary | ICD-10-CM | POA: Diagnosis not present

## 2023-12-27 DIAGNOSIS — Z8669 Personal history of other diseases of the nervous system and sense organs: Secondary | ICD-10-CM

## 2023-12-27 DIAGNOSIS — R569 Unspecified convulsions: Secondary | ICD-10-CM

## 2023-12-27 DIAGNOSIS — D72819 Decreased white blood cell count, unspecified: Secondary | ICD-10-CM

## 2023-12-27 LAB — COMPREHENSIVE METABOLIC PANEL WITH GFR
ALT: 25 U/L (ref 0–44)
AST: 88 U/L — ABNORMAL HIGH (ref 15–41)
Albumin: 2.1 g/dL — ABNORMAL LOW (ref 3.5–5.0)
Alkaline Phosphatase: 52 U/L (ref 38–126)
Anion gap: 16 — ABNORMAL HIGH (ref 5–15)
BUN: 46 mg/dL — ABNORMAL HIGH (ref 6–20)
CO2: 26 mmol/L (ref 22–32)
Calcium: 7.3 mg/dL — ABNORMAL LOW (ref 8.9–10.3)
Chloride: 94 mmol/L — ABNORMAL LOW (ref 98–111)
Creatinine, Ser: 7.91 mg/dL — ABNORMAL HIGH (ref 0.44–1.00)
GFR, Estimated: 5 mL/min — ABNORMAL LOW (ref >60)
Glucose, Bld: 197 mg/dL — ABNORMAL HIGH (ref 70–99)
Potassium: 4.1 mmol/L (ref 3.5–5.1)
Sodium: 136 mmol/L (ref 135–145)
Total Bilirubin: 0.7 mg/dL (ref 0.0–1.2)
Total Protein: 5.6 g/dL — ABNORMAL LOW (ref 6.5–8.1)

## 2023-12-27 LAB — CORTISOL-AM, BLOOD: Cortisol - AM: 22.4 ug/dL (ref 6.7–22.6)

## 2023-12-27 MED ORDER — NEPRO/CARBSTEADY PO LIQD
237.0000 mL | Freq: Two times a day (BID) | ORAL | Status: DC
  Administered 2023-12-27 – 2023-12-31 (×3): 237 mL via ORAL

## 2023-12-27 MED ORDER — SODIUM CHLORIDE 0.9% FLUSH
10.0000 mL | Freq: Two times a day (BID) | INTRAVENOUS | Status: DC
  Administered 2023-12-27 – 2023-12-29 (×4): 10 mL
  Administered 2023-12-30: 20 mL
  Administered 2023-12-30 – 2023-12-31 (×2): 10 mL

## 2023-12-27 MED ORDER — MUPIROCIN 2 % EX OINT
1.0000 | TOPICAL_OINTMENT | Freq: Two times a day (BID) | CUTANEOUS | Status: DC
  Administered 2023-12-27 – 2023-12-31 (×7): 1 via NASAL
  Filled 2023-12-27: qty 22

## 2023-12-27 MED ORDER — ORAL CARE MOUTH RINSE: 15.0000 mL | OROMUCOSAL | Status: DC | PRN

## 2023-12-27 MED ORDER — SODIUM CHLORIDE 0.9 % IV SOLN
1000.0000 mg | Freq: Every day | INTRAVENOUS | Status: AC
  Administered 2023-12-27 – 2023-12-29 (×3): 1000 mg via INTRAVENOUS
  Filled 2023-12-27 (×4): qty 16

## 2023-12-27 MED ORDER — CHLORHEXIDINE GLUCONATE CLOTH 2 % EX PADS
6.0000 | MEDICATED_PAD | Freq: Every day | CUTANEOUS | Status: DC
  Administered 2023-12-27 – 2023-12-31 (×4): 6 via TOPICAL

## 2023-12-27 MED ORDER — HALOPERIDOL LACTATE 5 MG/ML IJ SOLN: 1.0000 mg | Freq: Four times a day (QID) | INTRAMUSCULAR | Status: DC | PRN

## 2023-12-27 MED ORDER — SODIUM CHLORIDE 0.9% FLUSH: 10.0000 mL | INTRAVENOUS | Status: DC | PRN

## 2023-12-27 NOTE — Progress Notes (Signed)
 PROGRESS NOTE    Tonya Myers  FMW:[id number] DOB: 28-Mar-1966 DOA: 12/16/2023 PCP: Valora Agent, MD  [id number]/[id number]-AA  LOS: 2 days   Brief hospital course:   Assessment & Plan: Tonya Myers is a pleasant 58 y.o. female with medical history significant for ESRD on hemodialysis, CLL in remission, thrombocytopenia, history of strep pneumonia meningitis, s/p multilevel laminectomy, HLD, HTN, DM, depression, anxiety, hypothyroidism, recent treatment of pneumonia and discharged home on 12/13/2023 and came to emergency room on 12/15/2023, who was found to have acute change in mental status today and hospitalist service was consulted for admission.  On 12/15/2023 patient came back from home concerning for generalized pain and weakness not able to function at home.  That time patient was seen by hospitalist in the ED and advised that patient does not need admission as there is no acute issues.  Patient's vital signs at the time was temperature 98.1, respiration 16 heart rate 86, blood pressure 113/90, SpO2 98% on room air. Today, went ED physician was rounding this patient, patient had a low-grade temperature, hypotension and acute change in mental status.  Nephrology seen the patient and advised for starting 250 cc bolus of normal saline, small dose of midodrine  for hypotension, chest x-ray reported by radiologist for possible infiltrate.  Etiology of low-grade fevers uncertain.  Hospitalist service was consulted for evaluation for admission for acute change in mental status and possible concern for sepsis.   Acute change in mental status  --etiology unclear at this time.  has had fluctuating altered mental status for the past 3 years.  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  She has had some discordant increase in protein.  She had been  followed by neurology in the past.  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 --On 12/11/2023 ANA was positive again  and double-stranded DNA was elevated at >300 which is extremely high compared to 11 in 2022.  --neuro consult inpatient for concern of lupus cerebritis  --Consulted Rheumatology today.    Lupus --After discussing with Rheum Dr. Tobie and ID Dr. Fayette, it was decided that pt likely has Lupus. --start IV solumedrol 1000 mg daily for 3 days, per Rheum rec.  Sepsis ruled out Recurrent fevers --no source of infection currently.  Recurrent fevers now thought to be due to lupus. --hold further abx, per ID  PNA, ruled out - Chest x-ray showed New left basilar patchy opacities, however, no symptoms of PNA.  Abx d/c'ed, per ID rec.  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 --iHD per nephro   HTN, not currently active  hypotension --cont midodrine  5 mg TID   DM --last A1c 5.4.   HLD - Continue on Lipitor 20 mg daily   Hypothyroidism - Continue on levothyroxine    GERD - Continue Protonix   Pancytopenia  --all 3 cell lines have decreased since a month ago.  Likely due to lupus. --monitor  Hx of ITP Chronic thrombocytopenia --last saw Onc Dr. Jacobo on 11/25/23.  Hx of CLL --in complete remission, last saw Onc Dr. Jacobo on 11/25/23.   DVT prophylaxis: Heparin  SQ Code Status: DNR  Family Communication: daughter updated on  the phone today Level of care: Med-Surg Dispo:   The patient is from: home Anticipated d/c is to: SNF rehab Anticipated d/c date is: 2-3 days   Subjective and Interval History:  Pt reported no appetite when asked about oral intake.  Very minimum engagement.  Consulted Rheumatology today.  After discussing with Rheum Dr. Tobie  and ID Dr. Fayette, pt was dx with Lupus and started on high-dose steroid.   Objective: Vitals:   12/27/23 1335 12/27/23 1826 12/27/23 1831 12/27/23 2106  BP: 99/69 139/73 122/72 (!) 114/54  Pulse: 93 98 97 100  Resp: 18 18 17 19   Temp: 99.4 F (37.4 C) 99.6 F (37.6 C) 98.5 F (36.9 C) (!) 101.4 F (38.6 C)  TempSrc: Oral  Oral Oral  SpO2: 92% 95% 98% 92%  Weight:      Height:        Intake/Output Summary (Last 24 hours) at 12/27/2023 2135 Last data filed at 12/27/2023 1901 Gross per 24 hour  Intake 120 ml  Output --  Net 120 ml   Filed Weights   12/23/23 0810 12/26/23 0805 12/26/23 1325  Weight: 89.5 kg 87.3 kg 88.3 kg    Examination:   Constitutional: NAD, alert, oriented HEENT: conjunctivae and lids normal, EOMI CV: No cyanosis.   RESP: normal respiratory effort, on RA Psych: flat mood and affect.     Data Reviewed: I have personally reviewed labs and imaging studies  Time spent: 90 minutes  [name], MD [facility] If 7PM-7AM, please contact night-coverage 12/27/2023, 9:35 PM

## 2023-12-27 NOTE — Consult Note (Signed)
 NEUROLOGY CONSULT NOTE   Date of service: December 27, 2023 Patient Name: Tonya Myers MRN:  [id number] DOB:  June 04, 1965 Chief Complaint: AMS Requesting Provider: Awanda City, MD  History of Present Illness  Tonya Myers is a 58 y.o. female with hx of autoimmune disease, including lupus who presents with altered mental status.  She was recently treated for pneumonia and does state that she was coughing a fair bit.  That being said, it is not felt that it is definite clear she has pneumonia despite fevers, and new consolidation on chest x-ray in the setting of cough.  I discussed her with her daughter who does not live locally, and states that she spoke with her last week and she seemed fine.  She does note that anytime she gets an infection, or any other type of physiological stressor, she becomes confused.  She had pneumococcal meningitis in 2022 and it took her about a year to recover to something close to normal.  She currently is independent, takes care of herself, but as noted above with any physiological stressors she becomes confused and stutters, with speech difficulty.  She has not been doing well after discharge on 8/19 and came back on 8/21 with confusion.  She was complaining of generalized pain and weakness and was awaiting placement.  The ED physician rounded on 8/30 and found her to be hypotensive prompting admission to the hospital.  She does have a history of seizures but apparently self discontinued her seizure medicines earlier this year.  She has had multiple lumbar punctures, with protein elevated to 111 in August 2023 with no pleocytosis at that time.  CSF protein in March 2023 was 71 with two nucleated cells.  In November 2022 she had a CSF protein of 100 with a negative autoimmune encephalopathy panel.   Past History   Past Medical History:  Diagnosis Date   Acute hemorrhoid 03/11/2015   Anxiety    Arthritis    Asthma    Cancer (HCC)    Chronic  back pain    CLL (chronic lymphocytic leukemia) (HCC)    Depression    Diabetes mellitus without complication (HCC)    Genital herpes    type 2   Hypertension    Hypothyroidism    Renal disorder    Vertigo     Past Surgical History:  Procedure Laterality Date   ABDOMINAL HYSTERECTOMY     BILATERAL SALPINGOOPHORECTOMY  2009   BREAST BIOPSY Right 05/17/2016   FIBROADENOMATOUS CHANGE AND SCLEROSING ADENOSIS WITH   COLONOSCOPY WITH PROPOFOL  N/A 06/16/2015   Procedure: COLONOSCOPY WITH PROPOFOL ;  Surgeon: Donnice Vaughn Manes, MD;  Location: [facility] ENDOSCOPY;  Service: Endoscopy;  Laterality: N/A;   DIALYSIS/PERMA CATHETER INSERTION N/A 12/17/2020   Procedure: DIALYSIS/PERMA CATHETER INSERTION;  Surgeon: Marea Selinda RAMAN, MD;  Location: [facility] INVASIVE CV LAB;  Service: Cardiovascular;  Laterality: N/A;   LAPAROSCOPIC SUPRACERVICAL HYSTERECTOMY  2009   due to leio   OOPHORECTOMY     TEMPORARY DIALYSIS CATHETER N/A 12/10/2020   Procedure: TEMPORARY DIALYSIS CATHETER;  Surgeon: Jama Cordella MATSU, MD;  Location: [facility] INVASIVE CV LAB;  Service: Cardiovascular;  Laterality: N/A;   THORACIC LAMINECTOMY FOR EPIDURAL ABSCESS Bilateral 06/17/2020   Procedure: THORACIC LAMINECTOMY FOR EPIDURAL ABSCESS;  Surgeon: Bluford Standing, MD;  Location: [facility] ORS;  Service: Neurosurgery;  Laterality: Bilateral;    Family History: Family History  Problem Relation Age of Onset   Cancer Paternal Aunt    Breast cancer Maternal Aunt 30  Social History  reports that she has never smoked. She has never used smokeless tobacco. She reports that she does not currently use alcohol after a past usage of about 1.0 standard drink of alcohol per week. She reports that she does not use drugs.  Allergies  Allergen Reactions   Amoxapine     Other Reaction(s): Unknown   Latex Other (See Comments)   Ceftazidime     Other reaction(s): Confusion, Hallucination, Unknown Encephalopathy - improved after dialysis/some concern for  cephalosporin neurotoxicity Tolerated without confusion 06/2021. Encephalopathy - improved after dialysis/some concern for cephalosporin neurotoxicity Tolerated without confusion 06/2021.    Iodinated Contrast Media Hives and Rash    Other Reaction(s): Hives   Penicillins     Other Reaction(s): Unknown    Medications   Current Facility-Administered Medications:    acetaminophen  (TYLENOL ) tablet 650 mg, 650 mg, Oral, Q6H PRN, Gordan Huxley, MD, 650 mg at 12/27/23 2121   albuterol  (PROVENTIL ) (2.5 MG/3ML) 0.083% nebulizer solution 2.5 mg, 2.5 mg, Nebulization, Q6H PRN, Belue, Rankin RAMAN, RPH   alum & mag hydroxide-simeth (MAALOX/MYLANTA) 200-200-20 MG/5ML suspension 15 mL, 15 mL, Oral, Q6H PRN, Awanda City, MD   atorvastatin  (LIPITOR) tablet 20 mg, 20 mg, Oral, Daily, Forbach, Cory, MD, 20 mg at 12/27/23 9045   calcium  acetate (PHOSLO ) capsule 1,334 mg, 1,334 mg, Oral, TID WC, Forbach, Cory, MD, 1,334 mg at 12/27/23 9047   calcium  carbonate (TUMS - dosed in mg elemental calcium ) chewable tablet 200 mg of elemental calcium , 1 tablet, Oral, TID PRN, Awanda City, MD, 200 mg of elemental calcium  at 12/25/23 1023   Chlorhexidine  Gluconate Cloth 2 % PADS 6 each, 6 each, Topical, Daily, Awanda City, MD, 6 each at 12/27/23 1224   feeding supplement (NEPRO CARB STEADY) liquid 237 mL, 237 mL, Oral, BID BM, Breeze, Shantelle, NP, 237 mL at 12/27/23 1004   fentaNYL  (SUBLIMAZE ) injection 50 mcg, 50 mcg, Intravenous, Once, Rust-Chester, Jenita L, NP   fluticasone  (FLONASE ) 50 MCG/ACT nasal spray 2 spray, 2 spray, Each Nare, Daily, Gordan Huxley, MD, 2 spray at 12/26/23 1354   folic acid  (FOLVITE ) tablet 1 mg, 1 mg, Oral, Daily, Gordan Huxley, MD, 1 mg at 12/27/23 9047   haloperidol  lactate (HALDOL ) injection 1-2 mg, 1-2 mg, Intramuscular, Q6H PRN, Mansy, Jan A, MD   heparin  injection 5,000 Units, 5,000 Units, Subcutaneous, Q8H, Paudel, Keshab, MD, 5,000 Units at 12/27/23 1328   hydrOXYzine  (ATARAX ) tablet 25 mg,  25 mg, Oral, TID PRN, Forbach, Cory, MD, 25 mg at 12/19/23 2044   leptospermum manuka honey (MEDIHONEY) paste 1 Application, 1 Application, Topical, Daily, Awanda City, MD, 1 Application at 12/27/23 1224   levothyroxine  (SYNTHROID ) tablet 187.5 mcg, 187.5 mcg, Oral, Q Sun, Grubb, Rodney D, RPH, 187.5 mcg at 12/25/23 9479   lipase/protease/amylase (CREON ) capsule 12,000 Units, 12,000 Units, Oral, TID WC, Gordan Huxley, MD, 12,000 Units at 12/27/23 9047   loratadine  (CLARITIN ) tablet 10 mg, 10 mg, Oral, Daily, Gordan Huxley, MD, 10 mg at 12/26/23 1356   melatonin tablet 5 mg, 5 mg, Oral, QHS, Chappell, Alex B, RPH, 5 mg at 12/27/23 2010   methylPREDNISolone  sodium succinate  (SOLU-MEDROL ) 1,000 mg in sodium chloride  0.9 % 50 mL IVPB, 1,000 mg, Intravenous, Daily, Awanda City, MD, Last Rate: 66 mL/hr at 12/27/23 1933, 1,000 mg at 12/27/23 1933   midodrine  (PROAMATINE ) tablet 5 mg, 5 mg, Oral, TID WC, Awanda City, MD, 5 mg at 12/27/23 1809   multivitamin (RENA-VIT) tablet 1 tablet, 1 tablet, Oral, QHS, Gordan,  Darleene, MD, 1 tablet at 12/27/23 2011   mupirocin  ointment (BACTROBAN ) 2 % 1 Application, 1 Application, Nasal, BID, Awanda City, MD, 1 Application at 12/27/23 2011   ondansetron  (ZOFRAN ) tablet 4 mg, 4 mg, Oral, Q6H PRN **OR** ondansetron  (ZOFRAN ) injection 4 mg, 4 mg, Intravenous, Q6H PRN, Paudel, Keshab, MD   Oral care mouth rinse, 15 mL, Mouth Rinse, PRN, Awanda City, MD   pantoprazole  (PROTONIX ) EC tablet 40 mg, 40 mg, Oral, Daily, Forbach, Cory, MD, 40 mg at 12/27/23 9046   polyethylene glycol (MIRALAX  / GLYCOLAX ) packet 17 g, 17 g, Oral, Daily PRN, Paudel, Nena, MD   sodium chloride  flush (NS) 0.9 % injection 10-40 mL, 10-40 mL, Intracatheter, Q12H, Awanda City, MD, 10 mL at 12/27/23 1004   sodium chloride  flush (NS) 0.9 % injection 10-40 mL, 10-40 mL, Intracatheter, PRN, Awanda City, MD   zinc  sulfate (50mg  elemental zinc ) capsule 220 mg, 220 mg, Oral, Daily, Gordan Darleene, MD, 220 mg at 12/27/23  9047  Facility-Administered Medications Ordered in Other Encounters:    capsaicin  topical system 8 % patch 4 patch, 4 patch, Topical, Once, Marcelino Nurse, MD  Vitals   Vitals:   12/27/23 1335 12/27/23 1826 12/27/23 1831 12/27/23 2106  BP: 99/69 139/73 122/72 (!) 114/54  Pulse: 93 98 97 100  Resp: 18 18 17 19   Temp: 99.4 F (37.4 C) 99.6 F (37.6 C) 98.5 F (36.9 C) (!) 101.4 F (38.6 C)  TempSrc: Oral  Oral Oral  SpO2: 92% 95% 98% 92%  Weight:      Height:        Body mass index is 26.4 kg/m.   Physical Exam   Constitutional: Appears well-developed and well-nourished.   Neurologic Examination    Neuro: Mental Status: Patient is awake, alert, she has an expressive aphasia with stuttering.  Cranial Nerves: II: Visual Fields are full. Pupils are equal, round, and reactive to light.   III,IV, VI: EOMI without ptosis or diploplia.  V: Facial sensation is symmetric to temperature VII: Facial movement is symmetric.  VIII: hearing is intact to voice X: Uvula elevates symmetrically XII: tongue is midline without atrophy or fasciculations.  Motor: She gives poor effort throughout, but is able to move everything relatively symmetrically Sensory: She endorses symmetric sensation Cerebellar: No clear ataxia        Labs/Imaging/Neurodiagnostic studies   CBC:  Recent Labs  Lab 01-03-24 0818 12/24/23 1341 12/25/23 0454 12/26/23 0544  WBC 3.8* 3.8* 3.7* 3.9*  NEUTROABS 3.1 3.8  --   --   HGB 7.9* 9.0* 7.2* 8.3*  HCT 22.5* 29.5* 22.7* 24.7*  MCV 99.1 92.5 93.0 95.4  PLT 51* 53* 51* 56*   Basic Metabolic Panel:  Lab Results  Component Value Date   NA 139 12/26/2023   K 4.6 12/26/2023   CO2 24 12/26/2023   GLUCOSE 153 (H) 12/26/2023   BUN 71 (H) 12/26/2023   CREATININE 10.24 (H) 12/26/2023   CALCIUM  7.9 (L) 12/26/2023   GFRNONAA 4 (L) 12/26/2023   GFRAA >60 01/17/2020   Lipid Panel:  Lab Results  Component Value Date   LDLCALC 93 03/29/2023    HgbA1c:  Lab Results  Component Value Date   HGBA1C 5.6 07/29/2023   Urine Drug Screen:     Component Value Date/Time   LABOPIA NONE DETECTED 06/09/2020 2145   COCAINSCRNUR NONE DETECTED 06/09/2020 2145   LABBENZ NONE DETECTED 06/09/2020 2145   AMPHETMU NONE DETECTED 06/09/2020 2145   THCU NONE DETECTED 06/09/2020 2145  LABBARB NONE DETECTED 06/09/2020 2145    Alcohol Level     Component Value Date/Time   ETH <10 06/09/2020 2037   INR  Lab Results  Component Value Date   INR 1.2 12/25/2023   APTT  Lab Results  Component Value Date   APTT 37 (H) 07/15/2023   CT head is negative intracranially  ASSESSMENT   Tonya Myers is a 58 y.o. female with altered mental status in the setting of fevers, mild leukopenia, previous meningitis.  Her daughter does note that each time she becomes infected, she does become confused and therefore I do wonder about recrudescence of underlying deficits in the setting of acute physiological stress.  She does have an elevated double-stranded DNA and certainly lupus is a consideration, however I am not certain, even if she has lupus, that she has true CNS involvement given that she has become confused repeatedly in the past as well.  If infection has truly been ruled out including sinusitis, pneumonia, etc., then further immunosuppression with steroids might be helpful even if this is more of a systemic inflammatory process without direct involvement of the CNS.  She had an MRI in March and if there has been significant progression in the T2 changes since that time, this might support CNS direct involvement.  I would also favor repeating EEG, given her known seizure predisposition and lack of anti-epileptics. she has had LPDs on EEGs in the past and I will seek to further clarify with her why she stopped antiepileptics.   RECOMMENDATIONS  EEG Repeat MRI Appreciate ID involvement Neurology will continue to  follow ______________________________________________________________________    Signed, Aisha Seals, MD [facility]

## 2023-12-27 NOTE — Progress Notes (Signed)
   12/27/23 0451  Assess: MEWS Score  Temp (!) 101.8 F (38.8 C)  BP 111/76  MAP (mmHg) 84  Pulse Rate (!) 106  Resp 20  SpO2 95 %  O2 Device Room Air  Assess: MEWS Score  MEWS Temp 2  MEWS Systolic 0  MEWS Pulse 1  MEWS RR 0  MEWS LOC 0  MEWS Score 3  MEWS Score Color Yellow  Assess: if the MEWS score is Yellow or Red  Were vital signs accurate and taken at a resting state? Yes  Does the patient meet 2 or more of the SIRS criteria? Yes  Does the patient have a confirmed or suspected source of infection? No  MEWS guidelines implemented  No, previously yellow, continue vital signs every 4 hours  Provider Notification  Provider Name/Title no gave tylenol   Assess: SIRS CRITERIA  SIRS Temperature  1  SIRS Respirations  0  SIRS Pulse 1  SIRS WBC 0  SIRS Score Sum  2

## 2023-12-27 NOTE — TOC Progression Note (Signed)
 Transition of Care [facility]) - Progression Note    Patient Details  Name: Letisha Yera MRN: [id number] Date of Birth: 26-Oct-1965  [facility] [name] Contact  Corean ONEIDA Haddock, RN Phone Number: 12/27/2023, 1:50 PM  Clinical Narrative:      Patient currently with auth for signature health care.  Per MD patient not medically ready for discharge  HD coordinator working to [facility]  Met with patient at bedside to see if she would consider [employer]. Patient states she will think about it, she declines for me to discuss with family, however currently patient is A&O x2, with telesitter and mitts                   Expected Discharge Plan and Services                                               Social Drivers of Health (SDOH) Interventions SDOH Screenings   Food Insecurity: No Food Insecurity (12/11/2023)  Housing: Low Risk  (12/11/2023)  Transportation Needs: Unmet Transportation Needs (12/11/2023)  Utilities: Not At Risk (12/11/2023)  Depression (PHQ2-9): Low Risk  (12/06/2023)  Financial Resource Strain: High Risk (07/06/2023)   Received from [facility]  Social Connections: Unknown (08/12/2023)  Tobacco Use: Low Risk  (12/16/2023)    Readmission Risk Interventions    12/24/2021   12:03 PM  Readmission Risk Prevention Plan  Transportation Screening Complete  Medication Review (RN Care Manager) Complete  PCP or Specialist appointment within 3-5 days of discharge Complete  HRI or Home Care Consult Complete  SW Recovery Care/Counseling Consult Complete  Palliative Care Screening Not Applicable  Skilled Nursing Facility Complete

## 2023-12-27 NOTE — Plan of Care (Signed)
 ?  Problem: Respiratory: ?Goal: Ability to maintain adequate ventilation will improve ?Outcome: Progressing ?Goal: Ability to maintain a clear airway will improve ?Outcome: Progressing ?  ?

## 2023-12-27 NOTE — Progress Notes (Signed)
 Date of Admission:  12/16/2023    ID: Tonya Myers is a 58 y.o. female  Principal Problem:   AMS (altered mental status) Active Problems:   Hypothyroidism   Acute metabolic encephalopathy   Thrombocytopenia (HCC)   Dyslipidemia   Type 2 diabetes mellitus with peripheral neuropathy (HCC)   End-stage renal disease on hemodialysis (HCC)   Sepsis (HCC)   Chronic kidney disease on chronic dialysis [facility])  Patient is known to me from previous admissions in 2022 She is a limited historian Chart reviewed in detail Reviewed care everywhere and records from [facility] and [place] as well. Tonya Myers is a 58 y.o. 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, extensive thoracic space infection underwent laminectomy of T3-T4, T5 T7, T9-T10 and evacuation of abscess and treated with appropriate Antibiotics for more than 6 weeks,, diagnosed with PE and correlated vasculitis and AKI in December 13 2198 received high-dose steroids and rituximab  and later had to go on dialysis, seizure disorder recurrent hospitalizations the entire year of 2023 at [facility] for encephalopathy, seizures and had lumbar puncture with increasing protein Never been on treatment for ANCA vasculitis or a positive ANA with  dsDNA which is Lupus   Had seen rheumatology in the past at [facility] but not as outpatient, has been followed by neurology as outpatient for the seizures and peripheral neuropathy Was recently in [facility] between 12/11/2023 until 12/13/2023 for encephalopathy and treatment of pneumonia and there was a plan to discharge her to skilled nursing facility but she went home.  But she came back to the ED on 12/15/2023 as she could not manage at home by herself. She stayed in the ED from 12/15/2023 until today waiting for a SNF placement.  Today she was sent to the hospitalist site for admission because she had low BP and suspect  sepsis was question  Subjective: Pt is confused this evening   Medications:   atorvastatin   20 mg Oral Daily   calcium  acetate  1,334 mg Oral TID WC   Chlorhexidine  Gluconate Cloth  6 each Topical Daily   feeding supplement (NEPRO CARB STEADY)  237 mL Oral BID BM   fentaNYL  (SUBLIMAZE ) injection  50 mcg Intravenous Once   fluticasone   2 spray Each Nare Daily   folic acid   1 mg Oral Daily   heparin   5,000 Units Subcutaneous Q8H   leptospermum manuka honey  1 Application Topical Daily   levothyroxine   187.5 mcg Oral Q Sun   lipase/protease/amylase  12,000 Units Oral TID WC   loratadine   10 mg Oral Daily   melatonin  5 mg Oral QHS   midodrine   5 mg Oral TID WC   multivitamin  1 tablet Oral QHS   mupirocin  ointment  1 Application Nasal BID   pantoprazole   40 mg Oral Daily   sodium chloride  flush  10-40 mL Intracatheter Q12H   zinc  sulfate (50mg  elemental zinc )  220 mg Oral Daily    Objective: Vital signs in last 24 hours: Patient Vitals for the past 24 hrs:  BP Temp Temp src Pulse Resp SpO2  12/27/23 1335 99/69 99.4 F (37.4 C) Oral 93 18 92 %  12/27/23 0937 95/63 99.7 F (37.6 C) Oral (!) 107 20 100 %  12/27/23 0735 95/60 (!) 100.5 F (38.1 C) Oral 96 14 92 %  12/27/23 0646 -- 98.4 F (36.9 C) Oral -- -- --  12/27/23  0451 111/76 (!) 101.8 F (38.8 C) Oral (!) 106 20 95 %  12/26/23 2036 107/66 99.8 F (37.7 C) Oral (!) 103 19 100 %  12/26/23 2015 109/61 -- -- (!) 104 -- 97 %  12/26/23 1955 119/66 -- -- (!) 102 -- 97 %  12/26/23 1945 113/71 -- -- (!) 102 -- 95 %  12/26/23 1934 110/66 -- -- (!) 101 -- 97 %  12/26/23 1554 132/74 99.2 F (37.3 C) -- 95 19 95 %      PHYSICAL EXAM:  General: awake , confused, oriented in self, some agitation, follows some commands No neck stiffness Lungs:b/l air entry Heart: Regular rate and rhythm, no murmur, rub or gallop. Abdomen: Soft, non-tender,not distended. Bowel sounds normal. No masses Extremities: atraumatic, no cyanosis.  No edema. No clubbing Skin: No rashes or lesions. Or bruising Lymph: Cervical, supraclavicular normal. Neurologic: moves all limbs  Lab Results    Latest Ref Rng & Units 12/26/2023    5:44 AM 12/25/2023    4:54 AM 12/24/2023    1:41 PM  CBC  WBC 4.0 - 10.5 K/uL 3.9  3.7  3.8   Hemoglobin 12.0 - 15.0 g/dL 8.3  7.2  9.0   Hematocrit 36.0 - 46.0 % 24.7  22.7  29.5   Platelets 150 - 400 K/uL 56  51  53        Latest Ref Rng & Units 12/26/2023    5:44 AM 12/25/2023    4:54 AM 12/24/2023    1:41 PM  CMP  Glucose 70 - 99 mg/dL 846  875  99   BUN 6 - 20 mg/dL 71  55  43   Creatinine 0.44 - 1.00 mg/dL 89.75  2.01  3.38   Sodium 135 - 145 mmol/L 139  141  135   Potassium 3.5 - 5.1 mmol/L 4.6  4.5  4.5   Chloride 98 - 111 mmol/L 95  97  95   CO2 22 - 32 mmol/L 24  26  26    Calcium  8.9 - 10.3 mg/dL 7.9  8.0  8.2   Total Protein 6.5 - 8.1 g/dL  6.3  7.3   Total Bilirubin 0.0 - 1.2 mg/dL  0.9  1.2   Alkaline Phos 38 - 126 U/L  50  55   AST 15 - 41 U/L  105  118   ALT 0 - 44 U/L  23  26       Microbiology: [facility]     Assessment/Plan: Altered mental status- worse this evening Encephalopathy patient has had fluctuating altered mental status for the past 3 years she is alert at one time and then more sleepy at other times She has had multiple lumbar punctures ,In  February 2022 she had pneumococcal meningitis and after that she has not had any evidence of pleocytosis in 4 different lumbar puncture  done after that She has had some discordant increase in protein.  She had been followed by neurology in the past.  There was a concern at one time for lupus cerebritis but it was inconclusive and no treatment was given On 12/11/2023 ANA was  positive again  and double-stranded DNA was elevated at 300 which is extremely high compared to 11 in 2022.  So definitely we need to rule out any lupus cerebritis and  recommend neurology consult. Also with bicytopenia, fever > DSDNA,  SLE as cause of all her  symptoms is highly likely She also has a diagnosis of a seizure  disorder and was on antiseizure meds but she discontinued it herself even before her neurologist appointment in June 2025  Discussed with Dr.Patel Rheumatologist who concurs the diagnosis of lupus after reviewing chart He is planning high dose solumedrol  Recommend neuro consult Will give her meropenem /Doxy as having fever and repeat blood cultures have been sent- though I feel the fever is due to lupus will cover with antibiotics as she is getting solumedrol        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 She had a renal biopsy in August 2022 and that showed 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 Patient at that time took 3 doses of high-dose steroids and rituximab  I do not think she has been on any Rx  after that She is followed by nephrologist and she is on dialysis now   Patient does not look septic I doubt she has  pneumonia Though the chest x-ray is reported as a small infiltrate in the right lower lobe She was on ceftriaxone  and azithro which was stopped on Saturday Pt has left femoral vein IV cath Please remove it as high risk for infection I wound recommend midline instead    Thromocytopenia chronic  Anemia  Leucopenia (worsening)   Abdominal cramping- Xray shews chronic calcified pancreatitis   History of Treated pneumococcal meningitis and thoracic spine infection in 2022      history of CLL      Social issues.  Patient lives on her own and she is unable to cope on her own.      Discussed the management with  Dr.Lai and rheumatologist Dr.Patel  ________________________________________________

## 2023-12-27 NOTE — Progress Notes (Signed)
 Occupational Therapy Treatment Patient Details Name: Tonya Myers MRN: [id number] DOB: May 08, 1965 Today's Date: 12/27/2023   History of present illness Pt is a 58 y.o. female who returns to the ED for generalized weakness and inability to care for herself. She was just recently hospitalized with multiple issues but most specifically pneumonia. 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 She denies pain. Pt performed bed mobility with Min/Mod A for BLE management this date. Noted to have incont BM on bed linens requiring clean up and changing of pads. Pt able to stand at bedside with Mod A x2 and cues for overall technique and sequencing. Upon standing, pt continued to have BM and noted with bleeding from fem CVC site. Nurse notified and pt returned to supine for clean up. Min A to log roll to bil sides for cleanup requiring total assist. Gown change with Max A and heavy cues. Pt left with nurse at bed level to address bleeding.  Pt returned to bed with all needs in place and will cont to require skilled acute OT services to maximize her safety and IND to return to PLOF.       If plan is discharge home, recommend the following:  A lot of help with bathing/dressing/bathroom;A lot of help with walking and/or transfers;Help with stairs or ramp for entrance;Assistance with cooking/housework   Equipment Recommendations  Other (comment) (defer)    Recommendations for Other  Services      Precautions / Restrictions Precautions Precautions: Fall Recall of Precautions/Restrictions: Impaired Precaution/Restrictions Comments: Monitor BP; L fem CVC Restrictions Weight Bearing Restrictions Per Provider Order: No       Mobility Bed Mobility Overal bed mobility: Needs Assistance Bed Mobility: Supine to Sit, Sit to Supine, Rolling Rolling: Min assist   Supine to sit: Min assist, Mod assist, HOB elevated Sit to supine: Mod assist, HOB elevated   General bed mobility comments: BLE assist and cues for technique and initiation    Transfers Overall transfer level: Needs assistance Equipment used: Rolling walker (2 wheels) Transfers: Sit to/from Stand Sit to Stand: Mod assist, +2 physical assistance, From elevated surface           General transfer comment: requires increased bed height, cues for sequencing and technique and to return to sitting position safely     Balance Overall balance assessment: Needs assistance Sitting-balance support: Bilateral upper extremity supported, Feet supported Sitting balance-Leahy Scale: Fair     Standing balance support: Bilateral upper extremity supported, Reliant on assistive device for balance Standing balance-Leahy Scale: Poor Standing balance comment: 2 assist for safety in standing with RW use                           ADL either performed or assessed with clinical judgement   ADL Overall ADL's : Needs assistance/impaired                 Upper Body Dressing : Bed level;Maximal assistance Upper Body Dressing Details (indicate cue type and reason): Heavy cues to initiate tasks Lower Body  Dressing: Maximal assistance;Total assistance;Bed level Lower Body Dressing Details (indicate cue type and reason): donn socks       Toileting - Clothing Manipulation Details (indicate cue type and reason): total assist at bed level for peri-care after incont episode, attempted cleaning in standing, but  pt continued to have incont BM onto sheets and floor as well as noted bleeding from fem CVC            Extremity/Trunk Assessment              Vision       Perception     Praxis     Communication Communication Communication: Impaired Factors Affecting Communication: Difficulty expressing self   Cognition Arousal: Alert Behavior During Therapy: Flat affect               OT - Cognition Comments: appears unoriented, unaware of current situation                 Following commands: Impaired Following commands impaired: Follows one step commands inconsistently, Follows one step commands with increased time      Cueing   Cueing Techniques: Verbal cues, Gestural cues, Tactile cues, Visual cues  Exercises      Shoulder Instructions       General Comments noted bleeding from fem CVC site, nurse notified; pt also with incont BM requiring clean up    Pertinent Vitals/ Pain       Pain Assessment Pain Assessment: No/denies pain Pain Intervention(s): Monitored during session, Repositioned  Home Living                                          Prior Functioning/Environment              Frequency  Min 2X/week        Progress Toward Goals  OT Goals(current goals can now be found in the care plan section)  Progress towards OT goals: Progressing toward goals  Acute Rehab OT Goals Patient Stated Goal: improve function and strength OT Goal Formulation: With patient Time For Goal Achievement: 12/30/23 Potential to Achieve Goals: Good  Plan      Co-evaluation    PT/OT/SLP Co-Evaluation/Treatment: Yes Reason for Co-Treatment: Necessary to address cognition/behavior during functional activity;For patient/therapist safety;To address functional/ADL transfers PT goals addressed during session: Mobility/safety with mobility;Balance;Proper use of DME OT goals addressed during session: ADL's and self-care      AM-PAC OT 6 Clicks  Daily Activity     Outcome Measure   Help from another person eating meals?: None Help from another person taking care of personal grooming?: A Little Help from another person toileting, which includes using toliet, bedpan, or urinal?: A Lot Help from another person bathing (including washing, rinsing, drying)?: A Lot Help from another person to put on and taking off regular upper body clothing?: A Lot Help from another person to put on and taking off regular lower body clothing?: A Lot 6 Click Score: 15    End of Session Equipment Utilized During Treatment: Rolling walker (2 wheels);Gait belt  OT Visit Diagnosis: Other abnormalities of gait and mobility (R26.89);Muscle weakness (generalized) (M62.81);Repeated falls (R29.6)   Activity Tolerance Patient tolerated treatment well   Patient Left in bed;with call bell/phone within reach;with bed alarm set   Nurse Communication Mobility status        Time: 8662-8590 OT Time Calculation (  min): 32 min  Charges: OT General Charges $OT Visit: 1 Visit OT Treatments $Self Care/Home Management : 8-22 mins  Clayton Jarmon, OTR/L  12/27/23, 3:09 PM   Cali Cuartas E Loxley Schmale 12/27/2023, 3:06 PM

## 2023-12-27 NOTE — Care Management Important Message (Signed)
 Important Message  Patient Details  Name: Tonya Myers MRN: [id number] Date of Birth: 1965-07-21   Important Message Given:  Yes - Medicare IM     Rojelio [name] 12/27/2023, 12:00 PM

## 2023-12-27 NOTE — Progress Notes (Signed)
 [facility]  ROUNDING NOTE   Subjective:   Patient has been admitted under observation for Physical deconditioning [R53.81] Failure to thrive in adult [R62.7] Chronic kidney disease on chronic dialysis (HCC) [N18.6, Z99.2] Community acquired pneumonia of left lower lobe of lung [J18.9] AMS (altered mental status) [R41.82]  Patient sitting up in bed Left soft mitt in place Continues to state she is dehydrated.   Febrile today   Objective:  Vital signs in last 24 hours:  Temp:  [98.3 F (36.8 C)-101.8 F (38.8 C)] 99.7 F (37.6 C) (09/02 0937) Pulse Rate:  [95-109] 107 (09/02 0937) Resp:  [14-26] 20 (09/02 0937) BP: (95-132)/(53-76) 95/63 (09/02 0937) SpO2:  [92 %-100 %] 100 % (09/02 0937) Weight:  [88.3 kg] 88.3 kg (09/01 1325)  Weight change:  Filed Weights   12/23/23 0810 12/26/23 0805 12/26/23 1325  Weight: 89.5 kg 87.3 kg 88.3 kg    Intake/Output: I/O last 3 completed shifts: In: 480 [P.O.:480] Out: -    Intake/Output this shift:  Total I/O In: 120 [P.O.:120] Out: -   Physical Exam: General: Ill appearing  Head: Normocephalic  Eyes: Anicteric  Lungs:  Clear to auscultation, normal effort  Heart: Regular rate   Abdomen:  Soft, nontender  Extremities:  No peripheral edema.  Neurologic: Alert, awake  Skin: No lesions  Access: Rt AVF    Basic Metabolic Panel: Recent Labs  Lab 12/21/23 0825 12/23/23 0818 12/24/23 1341 12/25/23 0454 12/26/23 0544  NA 136 138 135 141 139  K 5.1 4.9 4.5 4.5 4.6  CL 94* 94* 95* 97* 95*  CO2 26 25 26 26 24   GLUCOSE 162* 116* 99 124* 153*  BUN 60* 58* 43* 55* 71*  CREATININE 8.50* 8.67* 6.61* 7.98* 10.24*  CALCIUM  8.2* 8.3* 8.2* 8.0* 7.9*  MG  --   --   --   --  2.3  PHOS 4.7* 5.2*  --   --   --     Liver Function Tests: Recent Labs  Lab 12/21/23 0825 12/23/23 0818 12/24/23 1341 12/25/23 0454  AST  --   --  118* 105*  ALT  --   --  26 23  ALKPHOS  --   --  55 50  BILITOT  --   --  1.2 0.9   PROT  --   --  7.3 6.3*  ALBUMIN  2.5* 2.5* 2.6* 2.5*   No results for input(s): LIPASE, AMYLASE in the last 168 hours.  Recent Labs  Lab 12/24/23 1341  AMMONIA 15    CBC: Recent Labs  Lab 12/21/23 0855 12/23/23 0818 12/24/23 1341 12/25/23 0454 12/26/23 0544  WBC 4.9 3.8* 3.8* 3.7* 3.9*  NEUTROABS  --  3.1 3.8  --   --   HGB 8.5* 7.9* 9.0* 7.2* 8.3*  HCT 24.5* 22.5* 29.5* 22.7* 24.7*  MCV 96.5 99.1 92.5 93.0 95.4  PLT 47* 51* 53* 51* 56*    Cardiac Enzymes: Recent Labs  Lab 12/24/23 1341  CKTOTAL 445*    BNP: Invalid input(s): POCBNP  CBG: Recent Labs  Lab 12/24/23 1335 12/26/23 1039  GLUCAP 103* 164*     Microbiology: Results for orders placed or performed during the hospital encounter of 12/16/23  Resp panel by RT-PCR (RSV, Flu A&B, Covid) Anterior Nasal Swab     Status: None   Collection Time: 12/19/23  4:21 PM   Specimen: Anterior Nasal Swab  Result Value Ref Range Status   SARS Coronavirus 2 by RT PCR NEGATIVE NEGATIVE  Final    Comment: (NOTE) SARS-CoV-2 target nucleic acids are NOT DETECTED.  The SARS-CoV-2 RNA is generally detectable in upper respiratory specimens during the acute phase of infection. The lowest concentration of SARS-CoV-2 viral copies this assay can detect is 138 copies/mL. A negative result does not preclude SARS-Cov-2 infection and should not be used as the sole basis for treatment or other patient management decisions. A negative result may occur with  improper specimen collection/handling, submission of specimen other than nasopharyngeal swab, presence of viral mutation(s) within the areas targeted by this assay, and inadequate number of viral copies(<138 copies/mL). A negative result must be combined with clinical observations, patient history, and epidemiological information. The expected result is Negative.  Fact Sheet for Patients:  BloggerCourse.com  Fact Sheet for Healthcare  Providers:  SeriousBroker.it  This test is no t yet approved or cleared by the [place]  FDA and  has been authorized for detection and/or diagnosis of SARS-CoV-2 by FDA under an Emergency Use Authorization (EUA). This EUA will remain  in effect (meaning this test can be used) for the duration of the COVID-19 declaration under Section 564(b)(1) of the Act, 21 U.S.C.section 360bbb-3(b)(1), unless the authorization is terminated  or revoked sooner.       Influenza A by PCR NEGATIVE NEGATIVE Final   Influenza B by PCR NEGATIVE NEGATIVE Final    Comment: (NOTE) The Xpert Xpress SARS-CoV-2/FLU/RSV plus assay is intended as an aid in the diagnosis of influenza from Nasopharyngeal swab specimens and should not be used as a sole basis for treatment. Nasal washings and aspirates are unacceptable for Xpert Xpress SARS-CoV-2/FLU/RSV testing.  Fact Sheet for Patients: BloggerCourse.com  Fact Sheet for Healthcare Providers: SeriousBroker.it  This test is not yet approved or cleared by the [place]  FDA and has been authorized for detection and/or diagnosis of SARS-CoV-2 by FDA under an Emergency Use Authorization (EUA). This EUA will remain in effect (meaning this test can be used) for the duration of the COVID-19 declaration under Section 564(b)(1) of the Act, 21 U.S.C. section 360bbb-3(b)(1), unless the authorization is terminated or revoked.     Resp Syncytial Virus by PCR NEGATIVE NEGATIVE Final    Comment: (NOTE) Fact Sheet for Patients: BloggerCourse.com  Fact Sheet for Healthcare Providers: SeriousBroker.it  This test is not yet approved or cleared by the [place]  FDA and has been authorized for detection and/or diagnosis of SARS-CoV-2 by FDA under an Emergency Use Authorization (EUA). This EUA will remain in effect (meaning this test can be  used) for the duration of the COVID-19 declaration under Section 564(b)(1) of the Act, 21 U.S.C. section 360bbb-3(b)(1), unless the authorization is terminated or revoked.  Performed at [facility], [address] [place], [place] [address]   Group A Strep by PCR [facility] Only)     Status: None   Collection Time: 12/19/23  4:21 PM   Specimen: Anterior Nasal Swab; Sterile Swab  Result Value Ref Range Status   Group A Strep by PCR NOT DETECTED NOT DETECTED Final    Comment: Performed at [facility], [address] [place], [place] [address]  Resp panel by RT-PCR (RSV, Flu A&B, Covid) Anterior Nasal Swab     Status: None   Collection Time: 12/24/23  1:39 PM   Specimen: Anterior Nasal Swab  Result Value Ref Range Status   SARS Coronavirus 2 by RT PCR NEGATIVE NEGATIVE Final    Comment: (NOTE) SARS-CoV-2 target nucleic acids are NOT DETECTED.  The SARS-CoV-2  RNA is generally detectable in upper respiratory specimens during the acute phase of infection. The lowest concentration of SARS-CoV-2 viral copies this assay can detect is 138 copies/mL. A negative result does not preclude SARS-Cov-2 infection and should not be used as the sole basis for treatment or other patient management decisions. A negative result may occur with  improper specimen collection/handling, submission of specimen other than nasopharyngeal swab, presence of viral mutation(s) within the areas targeted by this assay, and inadequate number of viral copies(<138 copies/mL). A negative result must be combined with clinical observations, patient history, and epidemiological information. The expected result is Negative.  Fact Sheet for Patients:  BloggerCourse.com  Fact Sheet for Healthcare Providers:  SeriousBroker.it  This test is no t yet approved or cleared by the [place]  FDA and  has been authorized for detection and/or diagnosis of  SARS-CoV-2 by FDA under an Emergency Use Authorization (EUA). This EUA will remain  in effect (meaning this test can be used) for the duration of the COVID-19 declaration under Section 564(b)(1) of the Act, 21 U.S.C.section 360bbb-3(b)(1), unless the authorization is terminated  or revoked sooner.       Influenza A by PCR NEGATIVE NEGATIVE Final   Influenza B by PCR NEGATIVE NEGATIVE Final    Comment: (NOTE) The Xpert Xpress SARS-CoV-2/FLU/RSV plus assay is intended as an aid in the diagnosis of influenza from Nasopharyngeal swab specimens and should not be used as a sole basis for treatment. Nasal washings and aspirates are unacceptable for Xpert Xpress SARS-CoV-2/FLU/RSV testing.  Fact Sheet for Patients: BloggerCourse.com  Fact Sheet for Healthcare Providers: SeriousBroker.it  This test is not yet approved or cleared by the [place]  FDA and has been authorized for detection and/or diagnosis of SARS-CoV-2 by FDA under an Emergency Use Authorization (EUA). This EUA will remain in effect (meaning this test can be used) for the duration of the COVID-19 declaration under Section 564(b)(1) of the Act, 21 U.S.C. section 360bbb-3(b)(1), unless the authorization is terminated or revoked.     Resp Syncytial Virus by PCR NEGATIVE NEGATIVE Final    Comment: (NOTE) Fact Sheet for Patients: BloggerCourse.com  Fact Sheet for Healthcare Providers: SeriousBroker.it  This test is not yet approved or cleared by the [place]  FDA and has been authorized for detection and/or diagnosis of SARS-CoV-2 by FDA under an Emergency Use Authorization (EUA). This EUA will remain in effect (meaning this test can be used) for the duration of the COVID-19 declaration under Section 564(b)(1) of the Act, 21 U.S.C. section 360bbb-3(b)(1), unless the authorization is terminated  or revoked.  Performed at [facility], [address] [place], [place] [address]   Blood Culture (routine x 2)     Status: None (Preliminary result)   Collection Time: 12/24/23  1:41 PM   Specimen: BLOOD  Result Value Ref Range Status   Specimen Description BLOOD BLOOD LEFT FOREARM  Final   Special Requests   Final    BOTTLES DRAWN AEROBIC AND ANAEROBIC Blood Culture adequate volume   Culture   Final    NO GROWTH 3 DAYS Performed at [facility], [address] [place], [place] [address]    Report Status PENDING  Incomplete  Blood Culture (routine x 2)     Status: None (Preliminary result)   Collection Time: 12/24/23  1:41 PM   Specimen: BLOOD LEFT HAND  Result Value Ref Range Status   Specimen Description BLOOD LEFT HAND  Final   Special Requests   Final  BOTTLES DRAWN AEROBIC AND ANAEROBIC Blood Culture results may not be optimal due to an inadequate volume of blood received in culture bottles   Culture   Final    NO GROWTH 3 DAYS Performed at [facility], [address] [place], [place] [address]    Report Status PENDING  Incomplete  MRSA Next Gen by PCR, Nasal     Status: Abnormal   Collection Time: 12/24/23  5:49 PM   Specimen: Nasal Mucosa; Nasal Swab  Result Value Ref Range Status   MRSA by PCR Next Gen DETECTED (A) NOT DETECTED Final    Comment: RESULT CALLED TO, READ BACK BY AND VERIFIED WITH: AMY MCCRAY RN 12/24/23 1937 KG (NOTE) The GeneXpert MRSA Assay (FDA approved for NASAL specimens only), is one component of a comprehensive MRSA colonization surveillance program. It is not intended to diagnose MRSA infection nor to guide or monitor treatment for MRSA infections. Test performance is not FDA approved in patients less than 26 years old. Performed at [facility], [address] [place], [place] [address]   Respiratory (~20 pathogens) panel by PCR     Status: None   Collection Time: 12/25/23  7:20 PM    Specimen: Nasopharyngeal Swab; Respiratory  Result Value Ref Range Status   Adenovirus NOT DETECTED NOT DETECTED Final   Coronavirus 229E NOT DETECTED NOT DETECTED Final    Comment: (NOTE) The Coronavirus on the Respiratory Panel, DOES NOT test for the novel  Coronavirus (2019 nCoV)    Coronavirus HKU1 NOT DETECTED NOT DETECTED Final   Coronavirus NL63 NOT DETECTED NOT DETECTED Final   Coronavirus OC43 NOT DETECTED NOT DETECTED Final   Metapneumovirus NOT DETECTED NOT DETECTED Final   Rhinovirus / Enterovirus NOT DETECTED NOT DETECTED Final   Influenza A NOT DETECTED NOT DETECTED Final   Influenza B NOT DETECTED NOT DETECTED Final   Parainfluenza Virus 1 NOT DETECTED NOT DETECTED Final   Parainfluenza Virus 2 NOT DETECTED NOT DETECTED Final   Parainfluenza Virus 3 NOT DETECTED NOT DETECTED Final   Parainfluenza Virus 4 NOT DETECTED NOT DETECTED Final   Respiratory Syncytial Virus NOT DETECTED NOT DETECTED Final   Bordetella pertussis NOT DETECTED NOT DETECTED Final   Bordetella Parapertussis NOT DETECTED NOT DETECTED Final   Chlamydophila pneumoniae NOT DETECTED NOT DETECTED Final   Mycoplasma pneumoniae NOT DETECTED NOT DETECTED Final    Comment: Performed at [facility], [address] [place], [place] [address]    Coagulation Studies: Recent Labs    12/24/23 1341 12/25/23 0454  LABPROT 14.9 15.4*  INR 1.1 1.2    Urinalysis: Recent Labs    12/24/23 1557  COLORURINE AMBER*  LABSPEC 1.019  PHURINE 7.0  GLUCOSEU NEGATIVE  HGBUR SMALL*  BILIRUBINUR NEGATIVE  KETONESUR NEGATIVE  PROTEINUR 100*  NITRITE NEGATIVE  LEUKOCYTESUR MODERATE*      Imaging: No results found.    Medications:      atorvastatin   20 mg Oral Daily   calcium  acetate  1,334 mg Oral TID WC   Chlorhexidine  Gluconate Cloth  6 each Topical Daily   feeding supplement (NEPRO CARB STEADY)  237 mL Oral BID BM   fentaNYL  (SUBLIMAZE ) injection  50 mcg Intravenous Once   fluticasone   2  spray Each Nare Daily   folic acid   1 mg Oral Daily   heparin   5,000 Units Subcutaneous Q8H   leptospermum manuka honey  1 Application Topical Daily   levothyroxine   187.5 mcg Oral Q Sun  lipase/protease/amylase  12,000 Units Oral TID WC   loratadine   10 mg Oral Daily   melatonin  5 mg Oral QHS   midodrine   5 mg Oral TID WC   multivitamin  1 tablet Oral QHS   mupirocin  ointment  1 Application Nasal BID   pantoprazole   40 mg Oral Daily   sodium chloride  flush  10-40 mL Intracatheter Q12H   zinc  sulfate (50mg  elemental zinc )  220 mg Oral Daily   acetaminophen , albuterol , alum & mag hydroxide-simeth, calcium  carbonate, haloperidol  lactate, hydrOXYzine , ondansetron  **OR** ondansetron  (ZOFRAN ) IV, mouth rinse, polyethylene glycol, sodium chloride  flush  Assessment/ Plan:  [name] is a 58 y.o.  female past medical history of end-stage renal disease on hemodialysis Monday Wednesday Friday schedule, ANCA related glomerulonephritis, diet-controlled diabetes, orthostatic hypotension and thrombocytopenia and CLL now admitted with complaints on generalized weakness and inability to care for herself. She is now being treated with antibiotics for pneumonia.   [facility] [place]/MWF/Rt AVF   Generalized weakness in the setting of ESRD on hemodialysis: Received dialysis yesterday, unable to remove fluid due to persistent hypotension. Received 1.3L of NS during treatment along with extra dose of Midodrine  5mg . Treatment terminated after 2.5hrs. Next treatment scheduled for Wednesday.    ANEMIA with chronic kidney disease: Hgb 8.3. Avoiding ESA due to CLL.   Secondary Hyperparathyroidism: with outpatient labs: PTH 568, phosphorus 5.1, calcium  9.0 on 11/28/23. Currently prescribed calcitriol and calcium  acetate outpatient. Binders resumed wit meals. Calcium  acceptable.    4. Hypertension with chronic kidney disease. Home regimen includes midodrine , currently held. Receiving  hydrochlorothiazide  during this admission. Blood pressure soft, 95/63.   5. 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 Most recent hemoglobin A1c is 5.6 on 07/29/23.   Diet controlled  6. Fever, unknown origin. Blood cultures negative. Sepsis ruled out, no source of infection.        LOS: 2 Rynlee Lisbon 9/2/202512:29 PM

## 2023-12-27 NOTE — Progress Notes (Signed)
 Physical Therapy Treatment Patient Details Name: Tonya Myers MRN: [id number] DOB: 12-14-1965 Today's Date: 12/27/2023   History of Present Illness Pt is a 58 y.o. female who returns to the ED for generalized weakness and inability to care for herself. She was just recently hospitalized with multiple issues but most specifically pneumonia. 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  Pt reporting no pain during session.  Resting in bed beginning of session pt's BP 99/69 with HR 102 bpm.  During session pt was min to mod assist with bed mobility; SBA sitting balance; and mod assist x2 to stand from elevated bed height up to RW.  Limited time standing d/t pt noted with bowel incontinence and also some blood noted to be dripping down front of pt's L thigh/leg (appearing to be coming from L femoral CVC).  Pt assisted back to bed to lay down and pressure applied to L femoral CVC to attempt to stop any further bleeding concerns; nurse notified immediately and came to assess pt (nurse assisted with pt care/peri-care, bed linens and gown changed, and nurse addressed L CVC concerns).  Will continue to focus on strengthening, balance, and progressive functional mobility during hospitalization.   If plan is discharge home, recommend the following: Two people to help with walking and/or transfers;A lot of help with bathing/dressing/bathroom;Assist for transportation;Help  with stairs or ramp for entrance   Can travel by private vehicle     No  Equipment Recommendations  Other (comment) (TBD at next facility)    Recommendations for Other Services       Precautions / Restrictions Precautions Precautions: Fall Recall of Precautions/Restrictions: Impaired Precaution/Restrictions Comments: Monitor BP; L fem CVC Restrictions Weight Bearing Restrictions Per Provider Order: No     Mobility  Bed Mobility Overal bed mobility: Needs Assistance Bed Mobility: Supine to Sit, Sit to Supine, Rolling Rolling: Min assist (logrolling L/R in bed to change sheets)   Supine to sit: Min assist, Mod assist, HOB elevated Sit to supine: Mod assist, HOB elevated   General bed mobility comments: assist for B LE's; vc's for technique    Transfers Overall transfer level: Needs assistance Equipment used: Rolling walker (2 wheels) Transfers: Sit to/from Stand Sit to Stand: Mod assist, +2 physical assistance, From elevated surface           General transfer comment: bed height elevated; vc's for UE positioning and overall technique; assist to come to full stand and control descent sitting    Ambulation/Gait               General Gait Details: Deferred (pt noted with diarrhea and some blood dripping down front of her L leg so pt assisted back to sitting on EOB to address concerns)   Stairs             Wheelchair Mobility     Tilt Bed    Modified Rankin (Stroke Patients Only)       Balance Overall balance assessment: Needs assistance Sitting-balance support: Bilateral upper extremity supported, Feet supported Sitting balance-Leahy Scale: Fair Sitting balance -  Comments: steady static sitting; SBA for safety   Standing balance support: Bilateral upper extremity supported, Reliant on assistive device for balance Standing balance-Leahy Scale: Poor Standing balance comment: 2 assist for safety in standing with RW use                             Communication Communication Communication: Impaired Factors Affecting Communication: Difficulty expressing self  Cognition Arousal: Alert Behavior During Therapy: Flat affect   PT - Cognitive impairments: Difficult to assess                       PT - Cognition Comments: Pt oriented to at least person (name and DOB); pt inconsistent with answering questions Following commands: Impaired Following commands impaired: Follows one step commands inconsistently, Follows one step commands with increased time    Cueing Cueing Techniques: Verbal cues, Gestural cues, Tactile cues, Visual cues  Exercises      General Comments General comments (skin integrity, edema, etc.): blood noted dripping down front of L leg when standing (nurse notified and came to address).  Nursing cleared pt for participation in physical therapy.  Pt agreeable to PT session.      Pertinent Vitals/Pain Pain Assessment Pain Assessment: No/denies pain Pain Score: 0-No pain Pain Intervention(s): Limited activity within patient's tolerance, Monitored during session, Repositioned    Home Living                          Prior Function            PT Goals (current goals can now be found in the care plan section) Acute Rehab PT Goals Patient Stated Goal: to get better PT Goal Formulation: With patient Time For Goal Achievement: 12/30/23 Potential to Achieve Goals: Fair Progress towards PT goals: Progressing toward goals    Frequency    Min 2X/week      PT Plan      Co-evaluation PT/OT/SLP Co-Evaluation/Treatment: Yes Reason for Co-Treatment: Necessary to address cognition/behavior during functional activity;For patient/therapist safety;To address functional/ADL transfers PT goals addressed during session: Mobility/safety with mobility;Balance;Proper use of DME OT goals addressed during session: ADL's and self-care      AM-PAC PT 6 Clicks Mobility   Outcome  Measure  Help needed turning from your back to your side while in a flat bed without using bedrails?: A Little Help needed moving from lying on your back to sitting on the side of a flat bed without using bedrails?: A Lot Help needed moving to and from a bed to a chair (including a wheelchair)?: Total Help needed standing up from a chair using your arms (e.g., wheelchair or bedside chair)?: Total Help needed to walk in hospital room?: Total Help needed climbing 3-5 steps with a railing? : Total 6 Click Score: 9    End of Session   Activity Tolerance: Other (comment) (Limited d/t bowel incontinence and blood dripping down front of L leg) Patient left: in bed;with call bell/phone within reach;with bed alarm set;with nursing/sitter in room;Other (comment) (B mitts donned; nurse present addressing L femoral CVC) Nurse Communication: Mobility status;Precautions;Other (comment) (L femoral CVC concerns and pt incontinent of BM) PT Visit Diagnosis: Unsteadiness on feet (R26.81);Other abnormalities of gait and mobility (R26.89);History of falling (Z91.81);Muscle weakness (generalized) (M62.81);Difficulty in walking, not elsewhere classified (R26.2)     Time: 8668-8590 PT Time Calculation (min) (ACUTE ONLY): 38 min  Charges:    $  Therapeutic Activity: 23-37 mins PT General Charges $$ ACUTE PT VISIT: 1 Visit                     Damien Caulk, PT 12/27/23, 2:31 PM

## 2023-12-27 NOTE — Progress Notes (Signed)
 I received a call from the Video monitor staff that they noticed what appeared to be blood on the patients blanket. Patient immediately checked on and she was having bleeding from her central line site. Bleeding stopped and dressing changed

## 2023-12-28 ENCOUNTER — Inpatient Hospital Stay

## 2023-12-28 DIAGNOSIS — M3214 Glomerular disease in systemic lupus erythematosus: Secondary | ICD-10-CM

## 2023-12-28 DIAGNOSIS — I7782 Antineutrophilic cytoplasmic antibody (ANCA) vasculitis: Secondary | ICD-10-CM | POA: Diagnosis not present

## 2023-12-28 DIAGNOSIS — G9341 Metabolic encephalopathy: Secondary | ICD-10-CM | POA: Diagnosis not present

## 2023-12-28 DIAGNOSIS — R569 Unspecified convulsions: Secondary | ICD-10-CM | POA: Diagnosis not present

## 2023-12-28 DIAGNOSIS — N186 End stage renal disease: Secondary | ICD-10-CM

## 2023-12-28 DIAGNOSIS — Z992 Dependence on renal dialysis: Secondary | ICD-10-CM

## 2023-12-28 DIAGNOSIS — A419 Sepsis, unspecified organism: Secondary | ICD-10-CM | POA: Diagnosis not present

## 2023-12-28 DIAGNOSIS — M329 Systemic lupus erythematosus, unspecified: Secondary | ICD-10-CM

## 2023-12-28 LAB — CBC WITH DIFFERENTIAL/PLATELET
Abs Immature Granulocytes: 0.01 K/uL (ref 0.00–0.07)
Basophils Absolute: 0 K/uL (ref 0.0–0.1)
Basophils Relative: 0 %
Eosinophils Absolute: 0 K/uL (ref 0.0–0.5)
Eosinophils Relative: 1 %
HCT: 19.9 % — ABNORMAL LOW (ref 36.0–46.0)
Hemoglobin: 6.7 g/dL — ABNORMAL LOW (ref 12.0–15.0)
Immature Granulocytes: 0 %
Lymphocytes Relative: 13 %
Lymphs Abs: 0.4 K/uL — ABNORMAL LOW (ref 0.7–4.0)
MCH: 32.8 pg (ref 26.0–34.0)
MCHC: 33.7 g/dL (ref 30.0–36.0)
MCV: 97.5 fL (ref 80.0–100.0)
Monocytes Absolute: 0.1 K/uL (ref 0.1–1.0)
Monocytes Relative: 3 %
Neutro Abs: 2.4 K/uL (ref 1.7–7.7)
Neutrophils Relative %: 83 %
Platelets: 54 K/uL — ABNORMAL LOW (ref 150–400)
RBC: 2.04 MIL/uL — ABNORMAL LOW (ref 3.87–5.11)
RDW: 20.3 % — ABNORMAL HIGH (ref 11.5–15.5)
WBC: 2.9 K/uL — ABNORMAL LOW (ref 4.0–10.5)
nRBC: 0 % (ref 0.0–0.2)

## 2023-12-28 LAB — LACTATE DEHYDROGENASE: LDH: 420 U/L — ABNORMAL HIGH (ref 98–192)

## 2023-12-28 LAB — FERRITIN: Ferritin: >7500 ng/mL — ABNORMAL HIGH (ref 11–307)

## 2023-12-28 LAB — AMMONIA: Ammonia: 16 umol/L (ref 9–35)

## 2023-12-28 LAB — PREPARE RBC (CROSSMATCH)

## 2023-12-28 MED ORDER — MIDODRINE HCL 5 MG PO TABS
ORAL_TABLET | ORAL | Status: AC
  Filled 2023-12-28: qty 1

## 2023-12-28 MED ORDER — SODIUM CHLORIDE 0.9 % IV SOLN
1.0000 g | Freq: Once | INTRAVENOUS | Status: AC
  Administered 2023-12-28: 1 g via INTRAVENOUS
  Filled 2023-12-28: qty 20

## 2023-12-28 MED ORDER — SODIUM CHLORIDE 0.9 % IV SOLN
500.0000 mg | INTRAVENOUS | Status: AC
  Administered 2023-12-28 – 2023-12-29 (×2): 500 mg via INTRAVENOUS
  Filled 2023-12-28 (×2): qty 10

## 2023-12-28 MED ORDER — SODIUM CHLORIDE 0.9% IV SOLUTION: Freq: Once | INTRAVENOUS | Status: AC

## 2023-12-28 MED ORDER — LEVETIRACETAM (KEPPRA) 500 MG/5 ML ADULT IV PUSH
1500.0000 mg | Freq: Once | INTRAVENOUS | Status: AC
  Administered 2023-12-28: 1500 mg via INTRAVENOUS
  Filled 2023-12-28: qty 15

## 2023-12-28 MED ORDER — LEVETIRACETAM 500 MG PO TABS
500.0000 mg | ORAL_TABLET | Freq: Two times a day (BID) | ORAL | Status: DC
  Administered 2023-12-28 – 2023-12-31 (×5): 500 mg via ORAL
  Filled 2023-12-28 (×6): qty 1

## 2023-12-28 MED ORDER — SODIUM CHLORIDE 0.9 % IV SOLN
100.0000 mg | Freq: Two times a day (BID) | INTRAVENOUS | Status: AC
  Administered 2023-12-28 – 2023-12-29 (×3): 100 mg via INTRAVENOUS
  Filled 2023-12-28 (×4): qty 100

## 2023-12-28 NOTE — Consult Note (Addendum)
 WOC Nurse wound follow up Refer to previous WOC consult note on 8/31. Pt has 2 areas of pressure injuries which have progressed to Unstageable; sacrum 2X.2cm, yellow moist slough and right buttock Unstageable yellow moist slough .8X.8cm. Lighter colored scar tissue surrounding from a previous wound to the sacrum which healed, according to the patient.  Cleaned incontinent stool; it will be difficult to promote healing since the location of the rectum and incontinence is in close proximity to the wounds.  Continue present plan of care. Topical treatment orders have been provided for bedside nurses to perform as follows to assist with removal of nonviable tissue:  Cleanse sacral/right buttock wounds with Vashe wound cleanser Soila [phone]) do not rinse and allow to air dry. Apply Medihoney to wound beds daily, cover with silicone foam. Change foam dressing Q 3 days or PRN soiling. WOC team will reassess Q 7-10 days to determine if a change in the plan of care is indicated at that time.  Thank-you,  Stephane Fought MSN, RN, CWOCN, CWCN-AP, CNS Contact Mon-Fri 0700-1500: [phone]

## 2023-12-28 NOTE — Progress Notes (Signed)
 Eeg done

## 2023-12-28 NOTE — Progress Notes (Signed)
 CBC came with hemoglobin of 6.7 and hematocrit 19.9 down from 8.3 and 24.7.  I attempted to call the patient's daughter Lawernce and had no answer.  Given urgency of the need for PRBCs transfusion we will go ahead and start with transfusion of 2 units of packed red blood cells.

## 2023-12-28 NOTE — Progress Notes (Signed)
 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 This RN was able to place USPIV in LUA. It is the recommendation of this VAST RN that if further access is needed that a CL be placed as no other suitable vasculature was visualized with US . Pt is not a candidate at this time for ML/PICC due to GFR value.

## 2023-12-28 NOTE — Consult Note (Signed)
 Rheumatology Consult Note  HPI  Reason for Consult: ANCA Vasculitis, Positive ANA  I have been asked to see this patient in consultation by Dr.Lia. I have reviewed the medical records from Dr.Lia. Tonya Myers is a 58 y.o. female who is seen on the dialysis unit. Her mental status waxes and wanes. I am not able to get full history from the patient. 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 She also has seizures and altered mental status. She has required multiple hospitalizations.   Her history is gathered from the chart review. I have discussed this case with Dr.Ravishanker. There is no infectious etiology. She gets recurrent fevers. She has history of CLL that is in remission.   ______________________________________________________________________    Current Facility-Administered Medications:    0.9 %  sodium chloride  infusion (Manually program via Guardrails IV Fluids), , Intravenous, Once, Mansy, Jan A, MD   acetaminophen  (TYLENOL ) tablet 650 mg, 650 mg, Oral, Q6H PRN, Gordan Huxley, MD, 650 mg at 12/27/23 2121   albuterol  (PROVENTIL ) (2.5 MG/3ML) 0.083% nebulizer solution 2.5 mg, 2.5 mg, Nebulization, Q6H PRN, Belue, Rankin RAMAN, RPH   alum & mag hydroxide-simeth (MAALOX/MYLANTA) 200-200-20 MG/5ML suspension 15 mL, 15 mL, Oral, Q6H PRN, Awanda City, MD   atorvastatin  (LIPITOR) tablet 20 mg, 20 mg, Oral, Daily, Forbach, Cory, MD, 20 mg at 12/28/23 1041   calcium  acetate (PHOSLO ) capsule 1,334 mg, 1,334 mg, Oral, TID WC, Forbach, Cory, MD, 1,334 mg at 12/27/23 9047   calcium  carbonate (TUMS - dosed in mg elemental calcium ) chewable tablet 200 mg of elemental calcium , 1 tablet, Oral, TID PRN, Awanda City, MD, 200 mg of elemental calcium  at 12/25/23 1023   Chlorhexidine  Gluconate Cloth 2 % PADS 6 each, 6 each, Topical, Daily, Awanda City, MD, 6 each at 12/28/23 0915   doxycycline  (VIBRAMYCIN ) 100 mg in sodium chloride  0.9 % 250  mL IVPB, 100 mg, Intravenous, Q12H, Ravishankar, Donald, MD, Stopped at 12/28/23 0436   feeding supplement (NEPRO CARB STEADY) liquid 237 mL, 237 mL, Oral, BID BM, Breeze, Shantelle, NP, 237 mL at 12/28/23 1041   fentaNYL  (SUBLIMAZE ) injection 50 mcg, 50 mcg, Intravenous, Once, Rust-Chester, Britton L, NP   fluticasone  (FLONASE ) 50 MCG/ACT nasal spray 2 spray, 2 spray, Each Nare, Daily, Gordan Huxley, MD, 2 spray at 12/28/23 1042   folic acid  (FOLVITE ) tablet 1 mg, 1 mg, Oral, Daily, Gordan Huxley, MD, 1 mg at 12/28/23 1041   haloperidol  lactate (HALDOL ) injection 1-2 mg, 1-2 mg, Intramuscular, Q6H PRN, Mansy, Jan A, MD   heparin  injection 5,000 Units, 5,000 Units, Subcutaneous, Q8H, Paudel, Keshab, MD, 5,000 Units at 12/27/23 1328   hydrOXYzine  (ATARAX ) tablet 25 mg, 25 mg, Oral, TID PRN, Forbach, Cory, MD, 25 mg at 12/19/23 2044   leptospermum manuka honey (MEDIHONEY) paste 1 Application, 1 Application, Topical, Daily, Clemmie Marxen, Sona, MD, 1 Application at 12/28/23 1043   levETIRAcetam  (KEPPRA ) tablet 500 mg, 500 mg, Oral, BID, [name], MD   levothyroxine  (SYNTHROID ) tablet 187.5 mcg, 187.5 mcg, Oral, Q Austin Nada Adriana JONETTA, RPH, 187.5 mcg at 12/25/23 9479   lipase/protease/amylase (CREON ) capsule 12,000 Units, 12,000 Units, Oral, TID WC, Gordan Huxley, MD, 12,000 Units at 12/27/23 9047   loratadine  (CLARITIN ) tablet 10 mg, 10 mg, Oral, Daily, Gordan Huxley, MD, 10 mg at 12/28/23 1041   melatonin tablet 5 mg, 5 mg, Oral, QHS, Chappell, Alex B, RPH, 5 mg at 12/27/23 2010   [COMPLETED] meropenem  (MERREM ) 1  g in sodium chloride  0.9 % 100 mL IVPB, 1 g, Intravenous, Once, Stopped at 12/28/23 0312 **FOLLOWED BY** meropenem  (MERREM ) 500 mg in sodium chloride  0.9 % 100 mL IVPB, 500 mg, Intravenous, Q24H, Belue, Rankin RAMAN, RPH   methylPREDNISolone  sodium succinate  (SOLU-MEDROL ) 1,000 mg in sodium chloride  0.9 % 50 mL IVPB, 1,000 mg, Intravenous, Daily, Awanda City, MD, Stopped at 12/27/23 2057    midodrine  (PROAMATINE ) tablet 5 mg, 5 mg, Oral, TID WC, Awanda City, MD, 5 mg at 12/28/23 1041   multivitamin (RENA-VIT) tablet 1 tablet, 1 tablet, Oral, QHS, Gordan Huxley, MD, 1 tablet at 12/27/23 2011   mupirocin  ointment (BACTROBAN ) 2 % 1 Application, 1 Application, Nasal, BID, Awanda City, MD, 1 Application at 12/28/23 1043   ondansetron  (ZOFRAN ) tablet 4 mg, 4 mg, Oral, Q6H PRN **OR** ondansetron  (ZOFRAN ) injection 4 mg, 4 mg, Intravenous, Q6H PRN, Paudel, Keshab, MD   Oral care mouth rinse, 15 mL, Mouth Rinse, PRN, Awanda City, MD   pantoprazole  (PROTONIX ) EC tablet 40 mg, 40 mg, Oral, Daily, Forbach, Cory, MD, 40 mg at 12/28/23 1041   polyethylene glycol (MIRALAX  / GLYCOLAX ) packet 17 g, 17 g, Oral, Daily PRN, Paudel, Nena, MD   sodium chloride  flush (NS) 0.9 % injection 10-40 mL, 10-40 mL, Intracatheter, Q12H, Awanda City, MD, 10 mL at 12/28/23 1043   sodium chloride  flush (NS) 0.9 % injection 10-40 mL, 10-40 mL, Intracatheter, PRN, Awanda City, MD   zinc  sulfate (50mg  elemental zinc ) capsule 220 mg, 220 mg, Oral, Daily, Forbach, Cory, MD, 220 mg at 12/28/23 1041  Facility-Administered Medications Ordered in Other Encounters:    capsaicin  topical system 8 % patch 4 patch, 4 patch, Topical, Once, Marcelino Nurse, MD  Allergies  Allergen Reactions   Amoxapine     Other Reaction(s): Unknown   Latex Other (See Comments)   Ceftazidime     Other reaction(s): Confusion, Hallucination, Unknown Encephalopathy - improved after dialysis/some concern for cephalosporin neurotoxicity Tolerated without confusion 06/2021. Encephalopathy - improved after dialysis/some concern for cephalosporin neurotoxicity Tolerated without confusion 06/2021.    Iodinated Contrast Media Hives and Rash    Other Reaction(s): Hives   Penicillins     Other Reaction(s): Unknown    Past Medical History:  Diagnosis Date   Acute hemorrhoid 03/11/2015   Anxiety    Arthritis    Asthma    Cancer (HCC)    Chronic back pain     CLL (chronic lymphocytic leukemia) (HCC)    Depression    Diabetes mellitus without complication (HCC)    Genital herpes    type 2   Hypertension    Hypothyroidism    Renal disorder    Vertigo     Past Surgical History:  Procedure Laterality Date   ABDOMINAL HYSTERECTOMY     BILATERAL SALPINGOOPHORECTOMY  2009   BREAST BIOPSY Right 05/17/2016   FIBROADENOMATOUS CHANGE AND SCLEROSING ADENOSIS WITH   COLONOSCOPY WITH PROPOFOL  N/A 06/16/2015   Procedure: COLONOSCOPY WITH PROPOFOL ;  Surgeon: Donnice Vaughn Manes, MD;  Location: [facility] ENDOSCOPY;  Service: Endoscopy;  Laterality: N/A;   DIALYSIS/PERMA CATHETER INSERTION N/A 12/17/2020   Procedure: DIALYSIS/PERMA CATHETER INSERTION;  Surgeon: Marea Selinda RAMAN, MD;  Location: [facility] INVASIVE CV LAB;  Service: Cardiovascular;  Laterality: N/A;   LAPAROSCOPIC SUPRACERVICAL HYSTERECTOMY  2009   due to leio   OOPHORECTOMY     TEMPORARY DIALYSIS CATHETER N/A 12/10/2020   Procedure: TEMPORARY DIALYSIS CATHETER;  Surgeon: Jama Cordella MATSU, MD;  Location: [facility] INVASIVE CV LAB;  Service: Cardiovascular;  Laterality: N/A;   THORACIC LAMINECTOMY FOR EPIDURAL ABSCESS Bilateral 06/17/2020   Procedure: THORACIC LAMINECTOMY FOR EPIDURAL ABSCESS;  Surgeon: Bluford Standing, MD;  Location: [facility] ORS;  Service: Neurosurgery;  Laterality: Bilateral;    Family History  Problem Relation Age of Onset   Cancer Paternal Aunt    Breast cancer Maternal Aunt 63    Social History   Tobacco Use   Smoking status: Never   Smokeless tobacco: Never  Substance Use Topics   Alcohol use: Not Currently    Alcohol/week: 1.0 standard drink of alcohol    Types: 1 Cans of beer per week    ______________________________________________________________________  Review of Systems:  Difficulty obtaining due to on Dialysis and Mental Status Waxes and Wanes   Objective:     12/28/2023    4:30 PM 12/28/2023    4:00 PM 12/28/2023    3:30 PM  Vitals with BMI  Systolic 101 111 896   Diastolic 73 73 70  Pulse 87 94 89   Blood pressure 101/73, pulse 87, temperature 98.7 F (37.1 C), resp. rate 14, height 6' (1.829 m), weight 88.3 kg, SpO2 100%.    GEN - Pleasant, On Dialysis  HEENT - normocephalic and atraumatic. Conjunctiva Clear.  Hard Palate with erythema, redness Neck - supple with no adenopathy or thyromegaly. Heart - regular rate and rhythm, No murmurs/gallops/rub, Nml S1S2 Lungs - clear to auscultation in all fields. Decrease air entry at base Extremities -Positive Edema Neurological - alert and oriented.  Skin - no rashes observed MSK - Limited Joint Exam on Dialysis   MCP Enlargement Both Knees with bony enlargement; right is worse  ______________________________________________________________________ Labs/Imaging Reviewed in EMR     Latest Ref Rng & Units 12/27/2023   10:25 PM 12/26/2023    5:44 AM 12/25/2023    4:54 AM  CMP  Glucose 70 - 99 mg/dL 802  846  875   BUN 6 - 20 mg/dL 46  71  55   Creatinine 0.44 - 1.00 mg/dL 2.08  89.75  2.01   Sodium 135 - 145 mmol/L 136  139  141   Potassium 3.5 - 5.1 mmol/L 4.1  4.6  4.5   Chloride 98 - 111 mmol/L 94  95  97   CO2 22 - 32 mmol/L 26  24  26    Calcium  8.9 - 10.3 mg/dL 7.3  7.9  8.0   Total Protein 6.5 - 8.1 g/dL 5.6   6.3   Total Bilirubin 0.0 - 1.2 mg/dL 0.7   0.9   Alkaline Phos 38 - 126 U/L 52   50   AST 15 - 41 U/L 88   105   ALT 0 - 44 U/L 25   23          Latest Ref Rng & Units 12/27/2023   10:21 PM 12/26/2023    5:44 AM 12/25/2023    4:54 AM  CBC  WBC 4.0 - 10.5 K/uL 2.9  3.9  3.7   Hemoglobin 12.0 - 15.0 g/dL 6.7  8.3  7.2   Hematocrit 36.0 - 46.0 % 19.9  24.7  22.7   Platelets 150 - 400 K/uL 54  56  51    CK 445 (H) LDH 420 (H)  Ferritin > 7500 (H)  Pos: ANA, Smith, SSA, RF Neg: AntiCCP  --2022-- Low C3 (24), C4 (7.8) Pos: ANA 1:160, SSA, Smith, Chromatin, ANCA. PRD (27.1) Neg: Jo1, Scl-70, SSB, RNP, dsDNA  Left Knee Xray (  2024): 1. Severe patellofemoral compartment  left knee osteoarthrosis.  2. Small left knee joint effusion.     MRI Brain (11/2021): Increased T2/FLAIR periventricular hyperintense signal comparing to  February 2022, likely representing chronic microvascular disease.   Assessment and Plan    Positive ANA, Smith, SSA, RF with cytopenia, seizures, altered mental status -- This is concerning for Lupus and lupus induced neurological manifestations.  -- She also has recurrent fever thought to be secondary to autoimmune as infectious etiology is ruled out. She does have hard palate erythema and inflammation.  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  -- Recommend hematology consultation regarding HLH concern and need for bone marrow biopsy.  -- Recommend Solumedrol 1 gram daily for three days followed by prednisone  60 mg/day. -- I agree with the hospital team regarding further evaluation at [facility]. -- Recommend checking Fibroinogen, triglycerides and soluble CD 25 level.    ANCA Vasculitis -- Kidney Biopsy Reviewed now on Dialysis

## 2023-12-28 NOTE — Progress Notes (Signed)
 Date of Admission:  12/16/2023    ID: Tonya Myers is a 58 y.o. female  Principal Problem:   AMS (altered mental status) Active Problems:   Hypothyroidism   Acute metabolic encephalopathy   Thrombocytopenia (HCC)   Dyslipidemia   Type 2 diabetes mellitus with peripheral neuropathy (HCC)   End-stage renal disease on hemodialysis (HCC)   Sepsis (HCC)   Chronic kidney disease on chronic dialysis (HCC)   Systemic lupus erythematosus (HCC)   ANCA-positive vasculitis (HCC)  Patient is known to me from previous admissions in 2022 She is a limited historian Chart reviewed in detail Reviewed care everywhere and records from [facility] and [place] as well. Tonya Myers is a 58 y.o. 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, extensive thoracic space infection underwent laminectomy of T3-T4, T5 T7, T9-T10 and evacuation of abscess and treated with appropriate Antibiotics for more than 6 weeks,, diagnosed with PE and correlated vasculitis and AKI in December 13 2198 received high-dose steroids and rituximab  and later had to go on dialysis, seizure disorder recurrent hospitalizations the entire year of 2023 at [facility] for encephalopathy, seizures and had lumbar puncture with increasing protein Never been on treatment for ANCA vasculitis or a positive ANA with  dsDNA which is Lupus   Had seen rheumatology in the past at [facility] but not as outpatient, has been followed by neurology as outpatient for the seizures and peripheral neuropathy Was recently in [facility] between 12/11/2023 until 12/13/2023 for encephalopathy and treatment of pneumonia and there was a plan to discharge her to skilled nursing facility but she went home.  But she came back to the ED on 12/15/2023 as she could not manage at home by herself. She stayed in the ED from 12/15/2023 until today waiting for a SNF placement.  Today she was sent to the  hospitalist site for admission because she had low BP and suspect sepsis was question  -----------------------------------------------------------------------  Pt not in her room when I went to se eher- had gone for eeg As per her nurse she was more alert and conversant    Medications:   sodium chloride    Intravenous Once   atorvastatin   20 mg Oral Daily   calcium  acetate  1,334 mg Oral TID WC   Chlorhexidine  Gluconate Cloth  6 each Topical Daily   feeding supplement (NEPRO CARB STEADY)  237 mL Oral BID BM   fentaNYL  (SUBLIMAZE ) injection  50 mcg Intravenous Once   fluticasone   2 spray Each Nare Daily   folic acid   1 mg Oral Daily   heparin   5,000 Units Subcutaneous Q8H   leptospermum manuka honey  1 Application Topical Daily   levETIRAcetam   500 mg Oral BID   levothyroxine   187.5 mcg Oral Q Sun   lipase/protease/amylase  12,000 Units Oral TID WC   loratadine   10 mg Oral Daily   melatonin  5 mg Oral QHS   midodrine   5 mg Oral TID WC   multivitamin  1 tablet Oral QHS   mupirocin  ointment  1 Application Nasal BID   pantoprazole   40 mg Oral Daily   sodium chloride  flush  10-40 mL Intracatheter Q12H   zinc  sulfate (50mg  elemental zinc )  220 mg Oral Daily    Objective: Vital signs in last 24 hours: Patient Vitals for the past 24 hrs:  BP Temp Temp src Pulse Resp SpO2  12/28/23 0940 120/70 99.2 F (  37.3 C) Oral 92 14 98 %  12/28/23 0455 124/74 98.6 F (37 C) (P) Oral 81 18 93 %  12/27/23 2106 (!) 114/54 (!) 101.4 F (38.6 C) Oral 100 19 92 %  12/27/23 1831 122/72 98.5 F (36.9 C) Oral 97 17 98 %  12/27/23 1826 139/73 99.6 F (37.6 C) -- 98 18 95 %  12/27/23 1335 99/69 99.4 F (37.4 C) Oral 93 18 92 %      Lab Results    Latest Ref Rng & Units 12/27/2023   10:21 PM 12/26/2023    5:44 AM 12/25/2023    4:54 AM  CBC  WBC 4.0 - 10.5 K/uL 2.9  3.9  3.7   Hemoglobin 12.0 - 15.0 g/dL 6.7  8.3  7.2   Hematocrit 36.0 - 46.0 % 19.9  24.7  22.7   Platelets 150 - 400 K/uL 54  56   51        Latest Ref Rng & Units 12/27/2023   10:25 PM 12/26/2023    5:44 AM 12/25/2023    4:54 AM  CMP  Glucose 70 - 99 mg/dL 802  846  875   BUN 6 - 20 mg/dL 46  71  55   Creatinine 0.44 - 1.00 mg/dL 2.08  89.75  2.01   Sodium 135 - 145 mmol/L 136  139  141   Potassium 3.5 - 5.1 mmol/L 4.1  4.6  4.5   Chloride 98 - 111 mmol/L 94  95  97   CO2 22 - 32 mmol/L 26  24  26    Calcium  8.9 - 10.3 mg/dL 7.3  7.9  8.0   Total Protein 6.5 - 8.1 g/dL 5.6   6.3   Total Bilirubin 0.0 - 1.2 mg/dL 0.7   0.9   Alkaline Phos 38 - 126 U/L 52   50   AST 15 - 41 U/L 88   105   ALT 0 - 44 U/L 25   23     Ferritin > 7500  Microbiology: BC- NG     Assessment/Plan: Altered mental status- worse this evening Encephalopathy patient has had fluctuating altered mental status for the past 3 years she is alert at one time and then more sleepy at other times She has had multiple lumbar punctures ,In  February 2022 she had pneumococcal meningitis and after that she has not had any evidence of pleocytosis in 4 different lumbar puncture  done after that She has had some discordant increase in protein.  She had been followed by neurology in the past.  There was a concern at one time for lupus cerebritis but it was inconclusive and no treatment was given On 12/11/2023 ANA was  positive again  and double-stranded DNA was elevated at 300 which is extremely high compared to 11 in 2022. With delirium, encephalopathy, cytopenia, fever > DSDNA,  SLE as cause of all her symptoms is highly likely. Now wih serum ferritin > 7500 need to rule out HLH ( hemophagocytic lymphohistiocytosis) Looks like 1 gram of solumedrol she got yesterday made her mental status better today? 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 She had a renal biopsy in August 2022 and that showed 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 Patient at that time took 3 doses of high-dose steroids and rituximab  I do not think she has been on any Rx  after that She is followed by nephrologist and she is on dialysis now   Patient does not look septic I doubt she has  pneumonia Though the chest x-ray is reported as a small infiltrate in the right lower lobe She was on ceftriaxone  and azithro which was stopped on Saturday Pt has left femoral vein IV cath Please remove it as high risk for infection I wound recommend midline instead    Thromocytopenia chronic  Anemia  Leucopenia (worsening)   Abdominal cramping- Xray shews chronic calcified pancreatitis   History of Treated pneumococcal meningitis and thoracic spine infection in 2022      history of CLL      Social issues.  Patient lives on her own and she is unable to cope on her own.    neurologist, hospitalist  and she would be better in [facility] due to her multiple complicated conditions and need for having all specialities in house  ________________________________________________

## 2023-12-28 NOTE — Procedures (Signed)
 History: 58 year old female with a history of seizures presenting with confusion, rule out seizures  EEG Duration: 22 minutes  Sedation: None  Patient State: Awake and drowsy  Technique: This EEG was acquired with electrodes placed according to the International 10-20 electrode system (including Fp1, Fp2, F3, F4, C3, C4, P3, P4, O1, O2, T3, T4, T5, T6, A1, A2, Fz, Cz, Pz). The following electrodes were missing or displaced: none.   Background: There is a posterior dominant rhythm that achieved a maximal frequency of 8 Hz, but the EEG is predominated by generalized irregular delta and theta range activities.  There are no rhythmic discharges, epileptiform discharges, or other features of epileptic concern.  Photic stimulation: Physiologic driving is not performed  EEG Abnormalities: 1) generalized irregular slow activity  Clinical Interpretation: This EEG is consistent with a generalized non-specific cerebral dysfunction(encephalopathy).   There was no seizure or seizure predisposition recorded on this study. Please note that lack of epileptiform activity on EEG does not preclude the possibility of epilepsy.   Aisha Seals, MD [facility]   If 7pm- 7am, please page neurology on call as listed in AMION.

## 2023-12-28 NOTE — Progress Notes (Addendum)
 [facility]  - [place] [facility]   PATIENT NAME: Tonya Myers    MR#:  [id number]  DATE OF BIRTH:  [birth year]  SUBJECTIVE:  no family at bedside. Patient awake and answered some questions appropriately. Tolerating PO diet. No fever reported. Very minimal engagement.  Spoke with patient's daughter on the phone regarding discussion with ID and neurology that patient will be best served at tertiary care center she is okay with [facility] transfer. Transfer initiated awaiting to speak with a 10    VITALS:  Blood pressure 132/78, pulse 84, temperature 98.7 F (37.1 C), resp. rate 19, height 6' (1.829 m), weight 88.3 kg, SpO2 100%.  PHYSICAL EXAMINATION:   GENERAL:  58 y.o.-year-old patient with no acute distress.  LUNGS: Normal breath sounds bilaterally, no wheezing CARDIOVASCULAR: S1, S2 normal. No murmur   ABDOMEN: Soft, nontender, nondistended.  EXTREMITIES: No  edema b/l.   HD access+ NEUROLOGIC: nonfocal  patient is alert and awake  Pt has 2 areas of pressure injuries which have progressed to Unstageable; sacrum 2X.2cm, yellow moist slough and right buttock Unstageable yellow moist slough .8X.8cm. Lighter colored scar tissue surrounding from a previous wound to the sacrum which healed, according to the patient.   LABORATORY PANEL:  CBC Recent Labs  Lab 12/27/23 2221  WBC 2.9*  HGB 6.7*  HCT 19.9*  PLT 54*    Chemistries  Recent Labs  Lab 12/26/23 0544 12/27/23 2225  NA 139 136  K 4.6 4.1  CL 95* 94*  CO2 24 26  GLUCOSE 153* 197*  BUN 71* 46*  CREATININE 10.24* 7.91*  CALCIUM  7.9* 7.3*  MG 2.3  --   AST  --  88*  ALT  --  25  ALKPHOS  --  52  BILITOT  --  0.7   Cardiac Enzymes No results for input(s): TROPONINI in the last 168 hours. RADIOLOGY:  EEG adult Result Date: 12/28/2023 Tonya Tonya SQUIBB, MD     12/28/2023  1:09 PM History: 58 year old female with a history of seizures presenting with confusion, rule out seizures EEG Duration:  22 minutes Sedation: None Patient State: Awake and drowsy Technique: This EEG was acquired with electrodes placed according to the International 10-20 electrode system (including Fp1, Fp2, F3, F4, C3, C4, P3, P4, O1, O2, T3, T4, T5, T6, A1, A2, Fz, Cz, Pz). The following electrodes were missing or displaced: none. Background: There is a posterior dominant rhythm that achieved a maximal frequency of 8 Hz, but the EEG is predominated by generalized irregular delta and theta range activities.  There are no rhythmic discharges, epileptiform discharges, or other features of epileptic concern. Photic stimulation: Physiologic driving is not performed EEG Abnormalities: 1) generalized irregular slow activity Clinical Interpretation: This EEG is consistent with a generalized non-specific cerebral dysfunction(encephalopathy). There was no seizure or seizure predisposition recorded on this study. Please note that lack of epileptiform activity on EEG does not preclude the possibility of epilepsy. Tonya Michaela, MD [facility] If 7pm- 7am, please page neurology on call as listed in AMION.  MR BRAIN WO CONTRAST Result Date: 12/28/2023 EXAM: MRI BRAIN WITHOUT CONTRAST 12/28/2023 06:38:00 AM TECHNIQUE: Multiplanar multisequence MRI of the head/brain was performed without the administration of intravenous contrast. COMPARISON: MRI of the head dated 07/25/2023. CLINICAL HISTORY: 58 y.o. female with hx of autoimmune disease, including lupus, presents with altered mental status and recent pneumonia. FINDINGS: BRAIN AND VENTRICLES: No acute infarct. No intracranial hemorrhage. No mass. No midline shift. No hydrocephalus. The  sella is unremarkable. Normal flow voids. Moderate periventricular and deep cerebral white matter disease present. ORBITS: No acute abnormality. SINUSES AND MASTOIDS: Extensive opacification of the paranasal sinuses with polypoid mucosal thickening present within the maxillary sinuses  bilaterally. BONES AND SOFT TISSUES: Normal marrow signal. No acute soft tissue abnormality. IMPRESSION: 1. No acute intracranial abnormality. 2. Moderate periventricular and deep cerebral white matter disease. 3. Extensive opacification of the paranasal sinuses with polypoid mucosal thickening within the maxillary sinuses bilaterally. Electronically signed by: Tonya Coho MD 12/28/2023 06:50 AM EDT RP Workstation: [id number]    Assessment and Plan  Tonya Myers is a pleasant 58 y.o. female with medical history significant for ESRD on hemodialysis, CLL in remission, thrombocytopenia, history of strep pneumonia meningitis, s/p multilevel laminectomy, HLD, HTN, DM, depression, anxiety, hypothyroidism, recent treatment of pneumonia and discharged home on 12/13/2023 and came to emergency room on 12/15/2023, who was found to have acute change in mental status today and hospitalist service was consulted for admission.  On 12/15/2023 patient came back from home concerning for generalized pain and weakness not able to function at home.  That time patient was seen by hospitalist in the ED and advised that patient does not need admission as there is no acute issues.  Patient's vital signs at the time was temperature 98.1, respiration 16 heart rate 86, blood pressure 113/90, SpO2 98% on room air. During her ER stay, patient had a low-grade temperature, hypotension and acute change in mental status.  Nephrology seen the patient and advised for starting 250 cc bolus of normal saline, small dose of midodrine  for hypotension, chest x-ray reported by radiologist for possible infiltrate.  Etiology of low-grade fevers uncertain.  Hospitalist service was consulted for evaluation for admission for acute change in mental status and possible concern for sepsis.   Acute change in mental status  --etiology unclear at this time.  has had fluctuating altered mental status for the past 3 years.  She has had multiple  lumbar punctures,  In February 2022 she had pneumococcal meningitis and after that she has not had any evidence of pleocytosis in 4 different lumbar puncture  done after that.  She has had some discordant increase in protein.  She had been followed by neurology in the past.  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 --On 12/11/2023 ANA was positive again  and double-stranded DNA was elevated at >300 which is extremely high compared to 11 in 2022.  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 Neurology recommends transfer to tertiary care center --[facility] Rheumatology with Dr CHRISTELLA Blanch-- patient currently on high doses of IV steroid   Lupus --After discussing with Rheum Dr. Blanch and ID Dr. Fayette, it was decided that pt likely has Lupus. --start IV solumedrol 1000 mg daily for 3 days, per Rheum rec. --high levels of Ferriritn and pancytopenia worrisome for HLH/MAS   Sepsis ruled out Recurrent fevers --no source of infection currently.  Recurrent fevers now thought to be due to lupus. -- Per ID currently on empiric doxycycline  and Meropenem  --pt will need midline and removal of femoral line to prevent CLABSI --ok with Dr Marcelino to get midline   PNA, ruled out - Chest x-ray showed New left basilar patchy opacities, however, no symptoms of PNA.     Abdominal pain Chronic pancreatitis --Lipase appears chronically elevated around 80's --KUB no acute finding --cont Creon    ESRD  on hemodialysis Due to P ANCA related vasculitis/Lupus Anemia of chronic disease --iHD per nephro --pt to get 2 PRBC once ready   HTN, not currently active   hypotension --cont midodrine  5 mg TID   DM --last A1c 5.4.   HLD - Continue on Lipitor 20 mg daily   Hypothyroidism - Continue on levothyroxine    GERD - Continue Protonix    Pancytopenia  --all 3 cell lines have decreased since a month ago.   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 Jacobo on 11/25/23.   Hx of CLL --in complete remission, last saw Onc Dr. Jacobo on 11/25/23.  Spoke with Dr Almarie Levels at [facility] who has accepted pt and she is on waitlist for now. Pt's dter aware of transfer process     DVT prophylaxis: Heparin  SQ Code Status: DNR  Family Communication: daughter updated on the phone today-- given permission to initiate transferred to [facility].  Call [facility] transfer center. Awaiting for on-call hospitalist to call back  Level of care: Med-Surg Status is: Inpatient Remains inpatient appropriate because: complex medical issues, waxing and waning mentation    TOTAL TIME TAKING CARE OF THIS PATIENT: 45 minutes.  >50% time spent on counselling and coordination of care  Note: This dictation was prepared with Dragon dictation along with smaller phrase technology. Any transcriptional errors that result from this process are unintentional.  Leita Blanch M.D    [facility]   CC: Primary care physician; Valora Agent, MD

## 2023-12-28 NOTE — Progress Notes (Signed)
  Received patient in bed to unit.   Informed consent signed and in chart.    TX duration: 3.5hrs  Unable to remove total fluid d/t low b/p's  Unable to give blood transfusion during dialysis d/t blood not being ready with enough time      Transported back to floor  Hand-off given to patient's nurse. No c/o and no acute distress noted    Access used R AVF Access issues none   Total UF removed: none Medication(s) given: midodrine  Post HD VS: wnl      Olivia Hurst LPN [facility]

## 2023-12-28 NOTE — Progress Notes (Signed)
 [facility]  ROUNDING NOTE   Subjective:   Patient has been admitted under observation for Physical deconditioning [R53.81] Failure to thrive in adult [R62.7] Chronic kidney disease on chronic dialysis (HCC) [N18.6, Z99.2] Community acquired pneumonia of left lower lobe of lung [J18.9] AMS (altered mental status) [R41.82]  Patient seen resting in bed quietly Alert and oriented to self Remains confused on details of treatment Room air  Scheduled for dialysis later today  Objective:  Vital signs in last 24 hours:  Temp:  [98.5 F (36.9 C)-101.4 F (38.6 C)] 99.2 F (37.3 C) (09/03 0940) Pulse Rate:  [81-100] 92 (09/03 0940) Resp:  [14-19] 14 (09/03 0940) BP: (99-139)/(54-74) 120/70 (09/03 0940) SpO2:  [92 %-98 %] 98 % (09/03 0940)  Weight change:  Filed Weights   12/23/23 0810 12/26/23 0805 12/26/23 1325  Weight: 89.5 kg 87.3 kg 88.3 kg    Intake/Output: I/O last 3 completed shifts: In: 800.2 [P.O.:360; IV Piggyback:440.2] Out: -    Intake/Output this shift:  Total I/O In: 240 [P.O.:240] Out: -   Physical Exam: General: Ill appearing  Head: Normocephalic  Eyes: Anicteric  Lungs:  Clear to auscultation, normal effort  Heart: Regular rate   Abdomen:  Soft, nontender  Extremities:  No peripheral edema.  Neurologic: Alert, awake  Skin: No lesions  Access: Rt AVF    Basic Metabolic Panel: Recent Labs  Lab 12/23/23 0818 12/24/23 1341 12/25/23 0454 12/26/23 0544 12/27/23 2225  NA 138 135 141 139 136  K 4.9 4.5 4.5 4.6 4.1  CL 94* 95* 97* 95* 94*  CO2 25 26 26 24 26   GLUCOSE 116* 99 124* 153* 197*  BUN 58* 43* 55* 71* 46*  CREATININE 8.67* 6.61* 7.98* 10.24* 7.91*  CALCIUM  8.3* 8.2* 8.0* 7.9* 7.3*  MG  --   --   --  2.3  --   PHOS 5.2*  --   --   --   --     Liver Function Tests: Recent Labs  Lab 12/23/23 0818 12/24/23 1341 12/25/23 0454 12/27/23 2225  AST  --  118* 105* 88*  ALT  --  26 23 25   ALKPHOS  --  55 50 52  BILITOT   --  1.2 0.9 0.7  PROT  --  7.3 6.3* 5.6*  ALBUMIN  2.5* 2.6* 2.5* 2.1*   No results for input(s): LIPASE, AMYLASE in the last 168 hours.  Recent Labs  Lab 12/24/23 1341  AMMONIA 15    CBC: Recent Labs  Lab 12/23/23 0818 12/24/23 1341 12/25/23 0454 12/26/23 0544 12/27/23 2221  WBC 3.8* 3.8* 3.7* 3.9* 2.9*  NEUTROABS 3.1 3.8  --   --  2.4  HGB 7.9* 9.0* 7.2* 8.3* 6.7*  HCT 22.5* 29.5* 22.7* 24.7* 19.9*  MCV 99.1 92.5 93.0 95.4 97.5  PLT 51* 53* 51* 56* 54*    Cardiac Enzymes: Recent Labs  Lab 12/24/23 1341  CKTOTAL 445*    BNP: Invalid input(s): POCBNP  CBG: Recent Labs  Lab 12/24/23 1335 12/26/23 1039  GLUCAP 103* 164*     Microbiology: Results for orders placed or performed during the hospital encounter of 12/16/23  Resp panel by RT-PCR (RSV, Flu A&B, Covid) Anterior Nasal Swab     Status: None   Collection Time: 12/19/23  4:21 PM   Specimen: Anterior Nasal Swab  Result Value Ref Range Status   SARS Coronavirus 2 by RT PCR NEGATIVE NEGATIVE Final    Comment: (NOTE) SARS-CoV-2 target nucleic acids are NOT  DETECTED.  The SARS-CoV-2 RNA is generally detectable in upper respiratory specimens during the acute phase of infection. The lowest concentration of SARS-CoV-2 viral copies this assay can detect is 138 copies/mL. A negative result does not preclude SARS-Cov-2 infection and should not be used as the sole basis for treatment or other patient management decisions. A negative result may occur with  improper specimen collection/handling, submission of specimen other than nasopharyngeal swab, presence of viral mutation(s) within the areas targeted by this assay, and inadequate number of viral copies(<138 copies/mL). A negative result must be combined with clinical observations, patient history, and epidemiological information. The expected result is Negative.  Fact Sheet for Patients:  BloggerCourse.com  Fact Sheet for  Healthcare Providers:  SeriousBroker.it  This test is no t yet approved or cleared by the [place]  FDA and  has been authorized for detection and/or diagnosis of SARS-CoV-2 by FDA under an Emergency Use Authorization (EUA). This EUA will remain  in effect (meaning this test can be used) for the duration of the COVID-19 declaration under Section 564(b)(1) of the Act, 21 U.S.C.section 360bbb-3(b)(1), unless the authorization is terminated  or revoked sooner.       Influenza A by PCR NEGATIVE NEGATIVE Final   Influenza B by PCR NEGATIVE NEGATIVE Final    Comment: (NOTE) The Xpert Xpress SARS-CoV-2/FLU/RSV plus assay is intended as an aid in the diagnosis of influenza from Nasopharyngeal swab specimens and should not be used as a sole basis for treatment. Nasal washings and aspirates are unacceptable for Xpert Xpress SARS-CoV-2/FLU/RSV testing.  Fact Sheet for Patients: BloggerCourse.com  Fact Sheet for Healthcare Providers: SeriousBroker.it  This test is not yet approved or cleared by the [place]  FDA and has been authorized for detection and/or diagnosis of SARS-CoV-2 by FDA under an Emergency Use Authorization (EUA). This EUA will remain in effect (meaning this test can be used) for the duration of the COVID-19 declaration under Section 564(b)(1) of the Act, 21 U.S.C. section 360bbb-3(b)(1), unless the authorization is terminated or revoked.     Resp Syncytial Virus by PCR NEGATIVE NEGATIVE Final    Comment: (NOTE) Fact Sheet for Patients: BloggerCourse.com  Fact Sheet for Healthcare Providers: SeriousBroker.it  This test is not yet approved or cleared by the [place]  FDA and has been authorized for detection and/or diagnosis of SARS-CoV-2 by FDA under an Emergency Use Authorization (EUA). This EUA will remain in effect (meaning this  test can be used) for the duration of the COVID-19 declaration under Section 564(b)(1) of the Act, 21 U.S.C. section 360bbb-3(b)(1), unless the authorization is terminated or revoked.  Performed at [facility], [address] [place], [place] [address]   Group A Strep by PCR [facility] Only)     Status: None   Collection Time: 12/19/23  4:21 PM   Specimen: Anterior Nasal Swab; Sterile Swab  Result Value Ref Range Status   Group A Strep by PCR NOT DETECTED NOT DETECTED Final    Comment: Performed at [facility], [address], [place], [place] [address]  Resp panel by RT-PCR (RSV, Flu A&B, Covid) Anterior Nasal Swab     Status: None   Collection Time: 12/24/23  1:39 PM   Specimen: Anterior Nasal Swab  Result Value Ref Range Status   SARS Coronavirus 2 by RT PCR NEGATIVE NEGATIVE Final    Comment: (NOTE) SARS-CoV-2 target nucleic acids are NOT DETECTED.  The SARS-CoV-2 RNA is generally detectable in upper respiratory specimens during the acute phase  of infection. The lowest concentration of SARS-CoV-2 viral copies this assay can detect is 138 copies/mL. A negative result does not preclude SARS-Cov-2 infection and should not be used as the sole basis for treatment or other patient management decisions. A negative result may occur with  improper specimen collection/handling, submission of specimen other than nasopharyngeal swab, presence of viral mutation(s) within the areas targeted by this assay, and inadequate number of viral copies(<138 copies/mL). A negative result must be combined with clinical observations, patient history, and epidemiological information. The expected result is Negative.  Fact Sheet for Patients:  BloggerCourse.com  Fact Sheet for Healthcare Providers:  SeriousBroker.it  This test is no t yet approved or cleared by the [place]  FDA and  has been authorized for detection and/or  diagnosis of SARS-CoV-2 by FDA under an Emergency Use Authorization (EUA). This EUA will remain  in effect (meaning this test can be used) for the duration of the COVID-19 declaration under Section 564(b)(1) of the Act, 21 U.S.C.section 360bbb-3(b)(1), unless the authorization is terminated  or revoked sooner.       Influenza A by PCR NEGATIVE NEGATIVE Final   Influenza B by PCR NEGATIVE NEGATIVE Final    Comment: (NOTE) The Xpert Xpress SARS-CoV-2/FLU/RSV plus assay is intended as an aid in the diagnosis of influenza from Nasopharyngeal swab specimens and should not be used as a sole basis for treatment. Nasal washings and aspirates are unacceptable for Xpert Xpress SARS-CoV-2/FLU/RSV testing.  Fact Sheet for Patients: BloggerCourse.com  Fact Sheet for Healthcare Providers: SeriousBroker.it  This test is not yet approved or cleared by the [place]  FDA and has been authorized for detection and/or diagnosis of SARS-CoV-2 by FDA under an Emergency Use Authorization (EUA). This EUA will remain in effect (meaning this test can be used) for the duration of the COVID-19 declaration under Section 564(b)(1) of the Act, 21 U.S.C. section 360bbb-3(b)(1), unless the authorization is terminated or revoked.     Resp Syncytial Virus by PCR NEGATIVE NEGATIVE Final    Comment: (NOTE) Fact Sheet for Patients: BloggerCourse.com  Fact Sheet for Healthcare Providers: SeriousBroker.it  This test is not yet approved or cleared by the [place]  FDA and has been authorized for detection and/or diagnosis of SARS-CoV-2 by FDA under an Emergency Use Authorization (EUA). This EUA will remain in effect (meaning this test can be used) for the duration of the COVID-19 declaration under Section 564(b)(1) of the Act, 21 U.S.C. section 360bbb-3(b)(1), unless the authorization is terminated  or revoked.  Performed at [facility], [address] [place], [place] [address]   Blood Culture (routine x 2)     Status: None (Preliminary result)   Collection Time: 12/24/23  1:41 PM   Specimen: BLOOD  Result Value Ref Range Status   Specimen Description BLOOD BLOOD LEFT FOREARM  Final   Special Requests   Final    BOTTLES DRAWN AEROBIC AND ANAEROBIC Blood Culture adequate volume   Culture   Final    NO GROWTH 3 DAYS Performed at [facility], [address] [place], [place] [address]    Report Status PENDING  Incomplete  Blood Culture (routine x 2)     Status: None (Preliminary result)   Collection Time: 12/24/23  1:41 PM   Specimen: BLOOD LEFT HAND  Result Value Ref Range Status   Specimen Description BLOOD LEFT HAND  Final   Special Requests   Final    BOTTLES DRAWN AEROBIC AND ANAEROBIC Blood Culture results may  not be optimal due to an inadequate volume of blood received in culture bottles   Culture   Final    NO GROWTH 3 DAYS Performed at [facility], [address] [place], [place] [address]    Report Status PENDING  Incomplete  MRSA Next Gen by PCR, Nasal     Status: Abnormal   Collection Time: 12/24/23  5:49 PM   Specimen: Nasal Mucosa; Nasal Swab  Result Value Ref Range Status   MRSA by PCR Next Gen DETECTED (A) NOT DETECTED Final    Comment: RESULT CALLED TO, READ BACK BY AND VERIFIED WITH: AMY MCCRAY RN 12/24/23 1937 KG (NOTE) The GeneXpert MRSA Assay (FDA approved for NASAL specimens only), is one component of a comprehensive MRSA colonization surveillance program. It is not intended to diagnose MRSA infection nor to guide or monitor treatment for MRSA infections. Test performance is not FDA approved in patients less than 64 years old. Performed at [facility], [address] [place], [place] [address]   Respiratory (~20 pathogens) panel by PCR     Status: None   Collection Time: 12/25/23  7:20 PM    Specimen: Nasopharyngeal Swab; Respiratory  Result Value Ref Range Status   Adenovirus NOT DETECTED NOT DETECTED Final   Coronavirus 229E NOT DETECTED NOT DETECTED Final    Comment: (NOTE) The Coronavirus on the Respiratory Panel, DOES NOT test for the novel  Coronavirus (2019 nCoV)    Coronavirus HKU1 NOT DETECTED NOT DETECTED Final   Coronavirus NL63 NOT DETECTED NOT DETECTED Final   Coronavirus OC43 NOT DETECTED NOT DETECTED Final   Metapneumovirus NOT DETECTED NOT DETECTED Final   Rhinovirus / Enterovirus NOT DETECTED NOT DETECTED Final   Influenza A NOT DETECTED NOT DETECTED Final   Influenza B NOT DETECTED NOT DETECTED Final   Parainfluenza Virus 1 NOT DETECTED NOT DETECTED Final   Parainfluenza Virus 2 NOT DETECTED NOT DETECTED Final   Parainfluenza Virus 3 NOT DETECTED NOT DETECTED Final   Parainfluenza Virus 4 NOT DETECTED NOT DETECTED Final   Respiratory Syncytial Virus NOT DETECTED NOT DETECTED Final   Bordetella pertussis NOT DETECTED NOT DETECTED Final   Bordetella Parapertussis NOT DETECTED NOT DETECTED Final   Chlamydophila pneumoniae NOT DETECTED NOT DETECTED Final   Mycoplasma pneumoniae NOT DETECTED NOT DETECTED Final    Comment: Performed at [facility], [address], [place], [place] [address]    Coagulation Studies: No results for input(s): LABPROT, INR in the last 72 hours.   Urinalysis: No results for input(s): COLORURINE, LABSPEC, PHURINE, GLUCOSEU, HGBUR, BILIRUBINUR, KETONESUR, PROTEINUR, UROBILINOGEN, NITRITE, LEUKOCYTESUR in the last 72 hours.  Invalid input(s): APPERANCEUR     Imaging: MR BRAIN WO CONTRAST Result Date: 12/28/2023 EXAM: MRI BRAIN WITHOUT CONTRAST 12/28/2023 06:38:00 AM TECHNIQUE: Multiplanar multisequence MRI of the head/brain was performed without the administration of intravenous contrast. COMPARISON: MRI of the head dated 07/25/2023. CLINICAL HISTORY: 58 y.o. female with hx of autoimmune  disease, including lupus, presents with altered mental status and recent pneumonia. FINDINGS: BRAIN AND VENTRICLES: No acute infarct. No intracranial hemorrhage. No mass. No midline shift. No hydrocephalus. The sella is unremarkable. Normal flow voids. Moderate periventricular and deep cerebral white matter disease present. ORBITS: No acute abnormality. SINUSES AND MASTOIDS: Extensive opacification of the paranasal sinuses with polypoid mucosal thickening present within the maxillary sinuses bilaterally. BONES AND SOFT TISSUES: Normal marrow signal. No acute soft tissue abnormality. IMPRESSION: 1. No acute intracranial abnormality. 2. Moderate periventricular and deep  cerebral white matter disease. 3. Extensive opacification of the paranasal sinuses with polypoid mucosal thickening within the maxillary sinuses bilaterally. Electronically signed by: [name] MD 12/28/2023 06:50 AM EDT RP Workstation: [id number]      Medications:    doxycycline  (VIBRAMYCIN ) IV Stopped (12/28/23 0436)   meropenem  (MERREM ) IV     methylPREDNISolone  (SOLU-MEDROL ) injection Stopped (12/27/23 2057)     sodium chloride    Intravenous Once   atorvastatin   20 mg Oral Daily   calcium  acetate  1,334 mg Oral TID WC   Chlorhexidine  Gluconate Cloth  6 each Topical Daily   feeding supplement (NEPRO CARB STEADY)  237 mL Oral BID BM   fentaNYL  (SUBLIMAZE ) injection  50 mcg Intravenous Once   fluticasone   2 spray Each Nare Daily   folic acid   1 mg Oral Daily   heparin   5,000 Units Subcutaneous Q8H   leptospermum manuka honey  1 Application Topical Daily   levETIRAcetam   500 mg Oral BID   levothyroxine   187.5 mcg Oral Q Sun   lipase/protease/amylase  12,000 Units Oral TID WC   loratadine   10 mg Oral Daily   melatonin  5 mg Oral QHS   midodrine   5 mg Oral TID WC   multivitamin  1 tablet Oral QHS   mupirocin  ointment  1 Application Nasal BID   pantoprazole   40 mg Oral Daily   sodium chloride  flush  10-40 mL  Intracatheter Q12H   zinc  sulfate (50mg  elemental zinc )  220 mg Oral Daily   acetaminophen , albuterol , alum & mag hydroxide-simeth, calcium  carbonate, haloperidol  lactate, hydrOXYzine , ondansetron  **OR** ondansetron  (ZOFRAN ) IV, mouth rinse, polyethylene glycol, sodium chloride  flush  Assessment/ Plan:  [name] is a 58 y.o.  female past medical history of end-stage renal disease on hemodialysis Monday Wednesday Friday schedule, ANCA related glomerulonephritis, diet-controlled diabetes, orthostatic hypotension and thrombocytopenia and CLL now admitted with complaints on generalized weakness and inability to care for herself. She is now being treated with antibiotics for pneumonia.   [facility]/MWF/Rt AVF   Generalized weakness in the setting of ESRD on hemodialysis: Scheduled for treatment later today.    ANEMIA with chronic kidney disease: Avoiding ESA due to CLL. Will obtain updated labs with dialysis. Hgb 6.7 yesterday, scheduled to receive blood transfusion with dialysis.   Secondary Hyperparathyroidism: with outpatient labs: PTH 568, phosphorus 5.1, calcium  9.0 on 11/28/23. Currently prescribed calcitriol and calcium  acetate outpatient. Binders resumed wit meals. Calcium  acceptable.    4. Hypertension with chronic kidney disease. Diuretics held. Receiving Midodrine  only. Blood pressure 120/70 this morning   5. 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 Most recent hemoglobin A1c is 5.6 on 07/29/23.   Diet controlled  6. Fever, unknown origin. Blood cultures negative. Sepsis ruled out, no source of infection.        LOS: 3 Tonya Myers 9/3/202512:12 PM

## 2023-12-28 NOTE — Progress Notes (Signed)
 OT Cancellation Note  Patient Details Name: Tonya Myers MRN: [id number] DOB: 1966-04-20   Cancelled Treatment:    Reason Eval/Treat Not Completed: Other (comment). Pt is off the floor for HD, will follow up as indicated.  Jody Aguinaga E Athalia Setterlund 12/28/2023, 3:34 PM

## 2023-12-28 NOTE — Progress Notes (Signed)
 Patient to receive 2 units of PRBCs. Writer called blood bank at 1535 to check on the status of production. PRBCs are currently not ready for administration. Technician will call hemodialysis when the blood has been completed and ready to be administered

## 2023-12-28 NOTE — Progress Notes (Signed)
 PT Cancellation Note  Patient Details Name: Tonya Myers MRN: [id number] DOB: July 06, 1965   Cancelled Treatment:    Reason Eval/Treat Not Completed: Patient at procedure or test/unavailable Pt out of room at 11:30, still out for dialysis at 1400 this afternoon.  Will maintain on caseload and continue to try and see as pt is able.    Carmin JONELLE Deed, DPT 12/28/2023, 2:05 PM

## 2023-12-28 NOTE — Telephone Encounter (Signed)
 Transfer Request Note  Requesting Physician: Adams Blanch MD  Requesting Hospital: [facility]  Requesting Service: [facility] mediine  Reason for Transfer: Medically complex patient  Brief Hospital Course:  Tonya Myers is a 58 y.o. 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 Infectious disease, nephrology, rheumatology, and neurology were consulted.   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  Ferritin >7500 (ferritin 1300 in March) LDH 450 Pancytopenia: WBC 2.9, H/H 6.7/19.9, Plt 54 AST/ALT 88/25 Alb 2.1 T bili 0.7  MRI brain was unrevealing. Spot 20 minute EEG unrevealing -- no seizures. Infectious work-up has been negative including CXR, RPP, blood cultures, UA/UCx, GAS swab. She was MRSA swab positive. Despite negative infectious work-up, she completed 3 days of ceftriaxone  2 g daily and 5 days of azithromycin . She then fevered again last night to 101.4 F and antibiotics were resumed. The patient is now on meropenem  and doxycycline  for empiric treatment. EBV, VZV, and fungitell pending.   Lumbar puncture has not been done.   Today she was started on IV Solumedrol 1000 mg daily x 3 days. Her home keppra  was resumed on 9/3.   She has been hemodynamically stable with normal HR 90s-100 & BP 100s/70s.   The patient has been added to the waitlist at this time.   Plan Upon Arrival: Consult ID, neurology,   Bed Type: Floor Are there any clinical exclusion criteria as of the current date, as defined in the [facility] <redacted file path>, to providing the patient's clinical care at [facility]? If yes, please indicate the exclusion criteria: YES, Neurology  Accepting Service: MDU or Appropriate for medicine services as determined by [facility] for  regionalization.  Imaging Needed:  Yes.  [employer] [facility]ate:  Yes.  [facility] [phone])  Once the patient's quaternary care center evaluation and treatment are completed, the requesting Physician and Hospital HAVE agreed to accept the patient on back transfer if the patient requires ongoing hospitalization that does not require continued quaternary level care at [facility].

## 2023-12-29 DIAGNOSIS — M329 Systemic lupus erythematosus, unspecified: Secondary | ICD-10-CM

## 2023-12-29 DIAGNOSIS — I7782 Antineutrophilic cytoplasmic antibody (ANCA) vasculitis: Secondary | ICD-10-CM | POA: Diagnosis not present

## 2023-12-29 DIAGNOSIS — A6009 Herpesviral infection of other urogenital tract: Secondary | ICD-10-CM

## 2023-12-29 DIAGNOSIS — G9341 Metabolic encephalopathy: Secondary | ICD-10-CM | POA: Diagnosis not present

## 2023-12-29 DIAGNOSIS — D761 Hemophagocytic lymphohistiocytosis: Secondary | ICD-10-CM

## 2023-12-29 DIAGNOSIS — A419 Sepsis, unspecified organism: Secondary | ICD-10-CM | POA: Diagnosis not present

## 2023-12-29 DIAGNOSIS — A601 Herpesviral infection of perianal skin and rectum: Secondary | ICD-10-CM

## 2023-12-29 DIAGNOSIS — N186 End stage renal disease: Secondary | ICD-10-CM | POA: Diagnosis not present

## 2023-12-29 LAB — CULTURE, BLOOD (ROUTINE X 2)
Culture: NO GROWTH
Culture: NO GROWTH
Special Requests: ADEQUATE

## 2023-12-29 LAB — CBC
HCT: 21.9 % — ABNORMAL LOW (ref 36.0–46.0)
Hemoglobin: 7.3 g/dL — ABNORMAL LOW (ref 12.0–15.0)
MCH: 30.5 pg (ref 26.0–34.0)
MCHC: 33.3 g/dL (ref 30.0–36.0)
MCV: 91.6 fL (ref 80.0–100.0)
Platelets: 62 K/uL — ABNORMAL LOW (ref 150–400)
RBC: 2.39 MIL/uL — ABNORMAL LOW (ref 3.87–5.11)
RDW: 18.5 % — ABNORMAL HIGH (ref 11.5–15.5)
WBC: 4.1 K/uL (ref 4.0–10.5)
nRBC: 0 % (ref 0.0–0.2)

## 2023-12-29 LAB — TRIGLYCERIDES: Triglycerides: 315 mg/dL — ABNORMAL HIGH (ref <150)

## 2023-12-29 LAB — FIBRINOGEN: Fibrinogen: 362 mg/dL (ref 210–475)

## 2023-12-29 MED ORDER — PREDNISONE 20 MG PO TABS
60.0000 mg | ORAL_TABLET | Freq: Every day | ORAL | Status: DC
  Administered 2023-12-31: 60 mg via ORAL
  Filled 2023-12-29: qty 3

## 2023-12-29 MED ORDER — NYSTATIN 100000 UNIT/ML MT SUSP
5.0000 mL | Freq: Four times a day (QID) | OROMUCOSAL | Status: DC
  Administered 2023-12-29 – 2023-12-31 (×5): 500000 [IU] via ORAL
  Filled 2023-12-29 (×6): qty 5

## 2023-12-29 MED ORDER — VALACYCLOVIR HCL 500 MG PO TABS
500.0000 mg | ORAL_TABLET | Freq: Every day | ORAL | Status: DC
  Administered 2023-12-29 – 2023-12-30 (×2): 500 mg via ORAL
  Filled 2023-12-29 (×2): qty 1

## 2023-12-29 NOTE — Progress Notes (Signed)
 Date of Admission:  12/16/2023    ID: Tonya Myers is a 58 y.o. female  Principal Problem:   AMS (altered mental status) Active Problems:   Hypothyroidism   Acute metabolic encephalopathy   Thrombocytopenia (HCC)   Dyslipidemia   Type 2 diabetes mellitus with peripheral neuropathy (HCC)   End-stage renal disease on hemodialysis (HCC)   Sepsis (HCC)   Chronic kidney disease on chronic dialysis (HCC)   Systemic lupus erythematosus (HCC)   ANCA-positive vasculitis (HCC)  Patient is known to me from previous admissions in 2022 She is a limited historian Chart reviewed in detail Reviewed care everywhere and records from [facility] and [place] as well. Tonya Myers is a 58 y.o. 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, extensive thoracic space infection underwent laminectomy of T3-T4, T5 T7, T9-T10 and evacuation of abscess and treated with appropriate Antibiotics for more than 6 weeks,, diagnosed with PE and correlated vasculitis and AKI in December 13 2198 received high-dose steroids and rituximab  and later had to go on dialysis, seizure disorder recurrent hospitalizations the entire year of 2023 at [facility] for encephalopathy, seizures and had lumbar puncture with increasing protein Never been on treatment for ANCA vasculitis or a positive ANA with  dsDNA which is Lupus   Had seen rheumatology in the past at [facility] but not as outpatient, has been followed by neurology as outpatient for the seizures and peripheral neuropathy Was recently in [facility] between 12/11/2023 until 12/13/2023 for encephalopathy and treatment of pneumonia and there was a plan to discharge her to skilled nursing facility but she went home.  But she came back to the ED on 12/15/2023 as she could not manage at home by herself. She stayed in the ED from 12/15/2023 until today waiting for a SNF placement.  Today she was sent to the  hospitalist site for admission because she had low BP and suspect sepsis was question  -----------------------------------------------------------------------  Patient is doing so much better Has received 3 doses of Solu-Medrol  1 g IV daily She was able to sit in the edge of the bed and then take a few steps and transferred to the chair and worked with OT with some help She complains of soreness in her mouth    Medications:   sodium chloride    Intravenous Once   atorvastatin   20 mg Oral Daily   calcium  acetate  1,334 mg Oral TID WC   Chlorhexidine  Gluconate Cloth  6 each Topical Daily   feeding supplement (NEPRO CARB STEADY)  237 mL Oral BID BM   fluticasone   2 spray Each Nare Daily   folic acid   1 mg Oral Daily   heparin   5,000 Units Subcutaneous Q8H   leptospermum manuka honey  1 Application Topical Daily   levETIRAcetam   500 mg Oral BID   levothyroxine   187.5 mcg Oral Q Sun   lipase/protease/amylase  12,000 Units Oral TID WC   loratadine   10 mg Oral Daily   melatonin  5 mg Oral QHS   midodrine   5 mg Oral TID WC   multivitamin  1 tablet Oral QHS   mupirocin  ointment  1 Application Nasal BID   pantoprazole   40 mg Oral Daily   [START ON 12/30/2023] predniSONE   60 mg Oral Q breakfast   sodium chloride  flush  10-40 mL Intracatheter Q12H   zinc  sulfate (50mg  elemental zinc )  220 mg Oral Daily  Objective: Vital signs in last 24 hours: Patient Vitals for the past 24 hrs:  BP Temp Temp src Pulse Resp SpO2  12/29/23 0854 127/66 98.2 F (36.8 C) -- 91 14 99 %  12/29/23 0649 (!) 116/59 98.6 F (37 C) -- 82 16 100 %  12/29/23 0437 (!) 103/58 98 F (36.7 C) Oral 82 16 96 %  12/29/23 0412 123/70 98 F (36.7 C) Oral 83 19 97 %  12/29/23 0204 (!) 111/57 98.6 F (37 C) Oral 79 16 93 %  12/28/23 2349 118/65 99.4 F (37.4 C) Oral 87 16 99 %  12/28/23 2322 109/67 98.4 F (36.9 C) Oral 88 13 98 %  12/28/23 1844 (!) 99/59 99.3 F (37.4 C) Oral 90 12 95 %  12/28/23 1720 107/66 -- --  91 20 100 %  12/28/23 1700 104/72 -- -- 88 16 100 %  12/28/23 1630 101/73 -- -- 87 14 100 %  12/28/23 1600 111/73 -- -- 94 17 100 %  12/28/23 1530 103/70 -- -- 89 15 99 %  12/28/23 1500 116/72 -- -- 90 13 100 %  12/28/23 1430 130/82 -- -- 78 18 98 %  12/28/23 1400 132/78 -- -- 84 19 100 %    On examination she is awake and more alert Oriented in place person year month and date Chronically ill-appearing Oral cavity has Toro on the roof with some ulceration of the mucosa Chest bilateral air entry Heart sound S1-S2 Abdomen soft She has ulcerating lesions on her perineal area and intergluteal area Swab taken for HSV DNA CNS is able to move all her limbs  Lab Results    Latest Ref Rng & Units 12/29/2023    8:59 AM 12/29/2023    3:31 AM 12/27/2023   10:21 PM  CBC  WBC 4.0 - 10.5 K/uL 3.7  4.1  2.9   Hemoglobin 12.0 - 15.0 g/dL 8.8  7.3  6.7   Hematocrit 36.0 - 46.0 % 13.7  21.9  19.9   Platelets 150 - 400 K/uL 51  62  54        Latest Ref Rng & Units 12/27/2023   10:25 PM 12/26/2023    5:44 AM 12/25/2023    4:54 AM  CMP  Glucose 70 - 99 mg/dL 802  846  875   BUN 6 - 20 mg/dL 46  71  55   Creatinine 0.44 - 1.00 mg/dL 2.08  89.75  2.01   Sodium 135 - 145 mmol/L 136  139  141   Potassium 3.5 - 5.1 mmol/L 4.1  4.6  4.5   Chloride 98 - 111 mmol/L 94  95  97   CO2 22 - 32 mmol/L 26  24  26    Calcium  8.9 - 10.3 mg/dL 7.3  7.9  8.0   Total Protein 6.5 - 8.1 g/dL 5.6   6.3   Total Bilirubin 0.0 - 1.2 mg/dL 0.7   0.9   Alkaline Phos 38 - 126 U/L 52   50   AST 15 - 41 U/L 88   105   ALT 0 - 44 U/L 25   23     Ferritin > 7500  Microbiology: [facility]- NG     Assessment/Plan: Encephalopathy patient has had fluctuating altered mental status for the past 3 years she is alert at one time and then more sleepy at other times She has had multiple lumbar punctures  since 2022 ,In  February 2022 she had pneumococcal meningitis and after  that she has not had any evidence of pleocytosis in 4  different lumbar puncture  done after that She has had some discordant increase in protein.  She had been followed by neurology in the past.  There was a concern at one time for lupus cerebritis but it was inconclusive and no treatment was given  On 12/11/2023 ANA was  positive again  and double-stranded DNA was elevated at 300 which is extremely high compared to 11 in 2022. With delirium, encephalopathy, cytopenia, fever > DSDNA,  SLE as cause of all her symptoms is highly likely. Now wih serum ferritin > 7500 need to rule out HLH ( hemophagocytic lymphohistiocytosis) She was seen by rheumatology and received 1 g of Solu-Medrol  daily for 3 doses and her mental status is improved.  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  And the central line has been removed from the femoral area.  She has not had any fever since Solu-Medrol  has been started and her mental status has also improved pretty dramatically.   I feel the fever was due to lupus and we can now discontinue antibiotics.  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  Will start on Valtrex  dose adjusted for renal dialysis 500 mg once a day. Oral cavity mucosal inflammation- for Nystatin  If no improvement may do fluconazole   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 She had a renal biopsy in August 2022 and  that showed 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 Patient at that time took 3 doses of high-dose steroids and rituximab  I do not think she has been on any Rx  after that She is followed by nephrologist and she is on dialysis now    Thromocytopenia chronic  Anemia received PRBC  Leucopenia stable  H/o CLL  Abdominal cramping- Xray shews chronic calcified pancreatitis   History of Treated pneumococcal meningitis and thoracic spine infection in 2022         Social issues.  Patient lives on her own and she is unable to cope on her own.     Pt is to be transferred to [facility] for management of complicated condition but is in their wait list   Discussed the management with patient and hospitalist and the nurse  ________________________________________________

## 2023-12-29 NOTE — Progress Notes (Signed)
 [facility]  - [place] at [facility]   PATIENT NAME: Tonya Myers    MR#:  [id number]  DATE OF BIRTH:  1966-03-27  SUBJECTIVE:  no family at bedside patient more conversing today. Ate breakfast. Having some loose stools. Did work with PT to sit in the recliner. Answered most of the questions quite appropriately.   VITALS:  Blood pressure 127/66, pulse 91, temperature 98.2 F (36.8 C), resp. rate 14, height 6' (1.829 m), weight 88.3 kg, SpO2 99%.  PHYSICAL EXAMINATION:   GENERAL:  58 y.o.-year-old patient with no acute distress.  LUNGS: Normal breath sounds bilaterally, no wheezing CARDIOVASCULAR: S1, S2 normal. No murmur   ABDOMEN: Soft, nontender, nondistended.  EXTREMITIES: No  edema b/l.   HD access+ NEUROLOGIC: nonfocal  patient is alert and awake  Pt has 2 areas of pressure injuries which have progressed to Unstageable; sacrum 2X.2cm, yellow moist slough and right buttock Unstageable yellow moist slough .8X.8cm. Lighter colored scar tissue surrounding from a previous wound to the sacrum which healed, according to the patient.   LABORATORY PANEL:  CBC Recent Labs  Lab 12/29/23 0859  WBC 3.7*  HGB 8.8*  HCT 13.7*  PLT 51*    Chemistries  Recent Labs  Lab 12/26/23 0544 12/27/23 2225  NA 139 136  K 4.6 4.1  CL 95* 94*  CO2 24 26  GLUCOSE 153* 197*  BUN 71* 46*  CREATININE 10.24* 7.91*  CALCIUM  7.9* 7.3*  MG 2.3  --   AST  --  88*  ALT  --  25  ALKPHOS  --  52  BILITOT  --  0.7    RADIOLOGY:  EEG adult Result Date: 12/28/2023 Tonya Tonya SQUIBB, MD     12/28/2023  1:09 PM History: 58 year old female with a history of seizures presenting with confusion, rule out seizures EEG Duration: 22 minutes Sedation: None Patient State: Awake and drowsy Technique: This EEG was acquired with electrodes placed according to the International 10-20 electrode system (including Fp1, Fp2, F3, F4, C3, C4, P3, P4, O1, O2, T3, T4, T5, T6, A1, A2, Fz, Cz, Pz).  The following electrodes were missing or displaced: none. Background: There is a posterior dominant rhythm that achieved a maximal frequency of 8 Hz, but the EEG is predominated by generalized irregular delta and theta range activities.  There are no rhythmic discharges, epileptiform discharges, or other features of epileptic concern. Photic stimulation: Physiologic driving is not performed EEG Abnormalities: 1) generalized irregular slow activity Clinical Interpretation: This EEG is consistent with a generalized non-specific cerebral dysfunction(encephalopathy). There was no seizure or seizure predisposition recorded on this study. Please note that lack of epileptiform activity on EEG does not preclude the possibility of epilepsy. Tonya Michaela, MD [facility] If 7pm- 7am, please page neurology on call as listed in AMION.  MR BRAIN WO CONTRAST Result Date: 12/28/2023 EXAM: MRI BRAIN WITHOUT CONTRAST 12/28/2023 06:38:00 AM TECHNIQUE: Multiplanar multisequence MRI of the head/brain was performed without the administration of intravenous contrast. COMPARISON: MRI of the head dated 07/25/2023. CLINICAL HISTORY: 58 y.o. female with hx of autoimmune disease, including lupus, presents with altered mental status and recent pneumonia. FINDINGS: BRAIN AND VENTRICLES: No acute infarct. No intracranial hemorrhage. No mass. No midline shift. No hydrocephalus. The sella is unremarkable. Normal flow voids. Moderate periventricular and deep cerebral white matter disease present. ORBITS: No acute abnormality. SINUSES AND MASTOIDS: Extensive opacification of the paranasal sinuses with polypoid mucosal thickening present within the maxillary sinuses bilaterally. BONES AND  SOFT TISSUES: Normal marrow signal. No acute soft tissue abnormality. IMPRESSION: 1. No acute intracranial abnormality. 2. Moderate periventricular and deep cerebral white matter disease. 3. Extensive opacification of the paranasal sinuses with  polypoid mucosal thickening within the maxillary sinuses bilaterally. Electronically signed by: [name] MD 12/28/2023 06:50 AM EDT RP Workstation: [id number]    Assessment and Plan  Tonya Myers is a pleasant 58 y.o. female with medical history significant for ESRD on hemodialysis, CLL in remission, thrombocytopenia, history of strep pneumonia meningitis, s/p multilevel laminectomy, HLD, HTN, DM, depression, anxiety, hypothyroidism, recent treatment of pneumonia and discharged home on 12/13/2023 and came to emergency room on 12/15/2023, who was found to have acute change in mental status today and hospitalist service was consulted for admission.  On 12/15/2023 patient came back from home concerning for generalized pain and weakness not able to function at home.  That time patient was seen by hospitalist in the ED and advised that patient does not need admission as there is no acute issues.  Patient's vital signs at the time was temperature 98.1, respiration 16 heart rate 86, blood pressure 113/90, SpO2 98% on room air. During her ER stay, patient had a low-grade temperature, hypotension and acute change in mental status.  Nephrology seen the patient and advised for starting 250 cc bolus of normal saline, small dose of midodrine  for hypotension, chest x-ray reported by radiologist for possible infiltrate.  Etiology of low-grade fevers uncertain.  Hospitalist service was consulted for evaluation for admission for acute change in mental status and possible concern for sepsis.   Acute change in mental status  --etiology unclear at this time.  has had fluctuating altered mental status for the past 3 years.  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  She has had some discordant increase in protein.  She had been followed by neurology in the  past.  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 --On 12/11/2023 ANA was positive again  and double-stranded DNA was elevated at >300 which is extremely high compared to 11 in 2022.  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 Neurology recommends transfer to tertiary care center --[facility] Rheumatology with Dr CHRISTELLA Blanch-- patient currently on high doses of IV steroid   Lupus --After discussing with Rheum Dr. Blanch and ID Dr. Fayette, it was decided that pt likely has Lupus. --start IV solumedrol 1000 mg daily for 3 days, per Rheum rec.--change to po prednisone  60 mg /every day from tomrrow --high levels of Ferriritn and pancytopenia worrisome for HLH/MAS --D/w Dr Jacobo (on the phone)-- who reported patients Bodnar biopsy in April 2025 was essentially negative for CLL. He recommends continue prednisone  above dosing at present. No urgent indication for bone marrow biopsy.    Sepsis ruled out Recurrent fevers --no source of infection currently.  Recurrent fevers now thought to be due to lupus. -- Per ID currently on empiric doxycycline  and Meropenem  --pt will need midline and removal of femoral line to prevent CLABSI --ok with Dr Marcelino to get midline   PNA, ruled out - Chest x-ray showed New left basilar patchy opacities, however, no symptoms of PNA.     Abdominal pain Chronic pancreatitis --Lipase appears chronically elevated around 80's --KUB no acute finding --cont  Creon    ESRD on hemodialysis Due to P ANCA related vasculitis/Lupus Anemia of chronic disease --iHD per nephro --s/p 2 unit BT on 12/29/2023--repeat hgb 8.8   HTN, not currently active  hypotension --cont midodrine  5 mg TID   DM --last A1c 5.4.   HLD - Continue on Lipitor  Hypothyroidism - Continue on levothyroxine    GERD - Continue Protonix    Pancytopenia  --all 3 cell lines have decreased since a month  ago.  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 Jacobo on 11/25/23.   Hx of CLL --in complete remission, last saw Onc Dr. Jacobo on 11/25/23.  [facility] pt and she is on waitlist for now. Pt's dter aware of transfer process     DVT prophylaxis: Heparin  SQ Code Status: DNR  Family Communication: daughter updated on the phone today-- given permission to initiate transferred to [facility]. Level of care: Med-Surg Status is: Inpatient Remains inpatient appropriate because: complex medical issues, waxing and waning mentation    TOTAL TIME TAKING CARE OF THIS PATIENT: 45 minutes.  >50% time spent on counselling and coordination of care  Note: This dictation was prepared with Dragon dictation along with smaller phrase technology. Any transcriptional errors that result from this process are unintentional.  Leita Blanch M.D    [facility]   CC: Primary care physician; Valora Agent, MD

## 2023-12-29 NOTE — Progress Notes (Signed)
 Occupational Therapy Treatment Patient Details Name: Tonya Myers MRN: [id number] DOB: 08/27/1965 Today's Date: 12/29/2023   History of present illness Pt is a 58 y.o. female who returns to the ED for generalized weakness and inability to care for herself. She was just recently hospitalized with multiple issues but most specifically pneumonia. 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 Easily arousable and agreeable to OT session. She reports mild pain d/t kanker sores in her mouth. Pt performed bed mobility with CGA and use of bed features. Pt required Min A for STS from elevated bed height and to take lateral steps towards HOB. Max A for LB dressing to donn brief d/t bouts of incont BM earlier this date. She performed functional mobility to bathroom with RW and CGA/Min A for safety to perform standing grooming tasks with Min A and unilateral support on sink or RW at all times to maintain stability. Pt fatigued at end of session and returned to bed. All needs in place and will cont to require skilled acute OT services to maximize her safety and IND to return to PLOF.       If plan is discharge home, recommend the following:  A lot of help with bathing/dressing/bathroom;A lot of help with walking and/or transfers;Help with stairs or ramp for entrance;Assistance with cooking/housework;A little help with walking and/or transfers   Equipment Recommendations  Other (comment) (defer)    Recommendations for Other Services       Precautions / Restrictions Precautions Precautions: Fall Recall of Precautions/Restrictions: Impaired Precaution/Restrictions Comments: Monitor BP Restrictions Weight Bearing Restrictions Per Provider Order: No       Mobility Bed Mobility Overal bed mobility: Needs Assistance Bed Mobility: Supine to Sit, Sit to Supine     Supine to sit: Contact guard, Used rails, HOB elevated Sit to supine: Contact guard assist   General bed mobility comments: CGA for safety, but able to perform on her own with use of bed features    Transfers Overall transfer level: Needs assistance Equipment used: Rolling walker (2 wheels) Transfers: Sit to/from Stand Sit to Stand: From elevated surface, Min assist           General transfer comment: bed elevated, able to stand x2 during session with Min A and take side steps towards HOB; ambulated ~6 ft total during session to bathroom and back     Balance Overall balance assessment: Needs assistance Sitting-balance support: Bilateral upper extremity supported, Feet supported Sitting balance-Leahy Scale: Good     Standing balance support: Bilateral upper extremity supported, Reliant on assistive device for balance, During functional activity Standing balance-Leahy Scale: Fair Standing balance comment: Min A and BUE support to unilateral support during dynamic tasks                           ADL either performed or assessed with clinical judgement   ADL Overall ADL's : Needs assistance/impaired     Grooming: Wash/dry hands;Minimal assistance;Oral care;Wash/dry face;Standing;Contact guard assist Grooming Details (indicate cue type and reason): to maintain balance at sink during grooming tasks  Lower Body Dressing: Sit to/from stand;Sitting/lateral leans;Maximal assistance Lower Body Dressing Details (indicate cue type and reason): donn brief                    Extremity/Trunk Assessment               Vision       Perception     Praxis     Communication Communication Communication: Impaired   Cognition Arousal: Alert Behavior During Therapy: Flat affect                                 Following commands: Intact        Cueing   Cueing Techniques: Verbal cues, Tactile cues  Exercises      Shoulder Instructions       General Comments      Pertinent Vitals/ Pain       Pain Assessment Pain Assessment: Faces Faces Pain Scale: Hurts a little bit Pain Location: kanker sores in mouth Pain Descriptors / Indicators: Discomfort Pain Intervention(s): Monitored during session  Home Living                                          Prior Functioning/Environment              Frequency  Min 2X/week        Progress Toward Goals  OT Goals(current goals can now be found in the care plan section)  Progress towards OT goals: Progressing toward goals  Acute Rehab OT Goals Patient Stated Goal: improve function and strength OT Goal Formulation: With patient Time For Goal Achievement: 01/12/24 Potential to Achieve Goals: Good  Plan      Co-evaluation                 AM-PAC OT 6 Clicks Daily Activity     Outcome Measure   Help from another person eating meals?: None Help from another person taking care of personal grooming?: A Little Help from another person toileting, which includes using toliet, bedpan, or urinal?: A Lot Help from another person bathing (including washing, rinsing, drying)?: A Lot Help from another person to put on and taking off regular upper body clothing?: A Little Help from another person to put on and taking off regular lower body clothing?: A Lot 6 Click Score: 16    End of Session Equipment Utilized During Treatment: Rolling walker (2 wheels);Gait belt  OT Visit Diagnosis: Other abnormalities of gait and mobility (R26.89);Muscle weakness (generalized) (M62.81);Repeated falls (R29.6)    Activity Tolerance Patient tolerated treatment well   Patient Left in bed;with call bell/phone within reach;with bed alarm set   Nurse Communication Mobility status        Time: 8571-8551 OT Time Calculation (min): 20 min  Charges: OT General Charges $OT Visit: 1 Visit OT Treatments $Self Care/Home Management : 8-22 mins  Tonya Myers, OTR/L  12/29/23, 5:11 PM   Tonya Myers 12/29/2023, 5:11 PM

## 2023-12-29 NOTE — Progress Notes (Signed)
 NEUROLOGY CONSULT FOLLOW UP NOTE   Date of service: December 29, 2023 Patient Name: Tonya Myers MRN:  [id number] DOB:  1965/07/17  Interval Hx/subjective   She is much improved, but still with some confusion Vitals   Vitals:   12/29/23 0412 12/29/23 0437 12/29/23 0649 12/29/23 0854  BP: 123/70 (!) 103/58 (!) 116/59 127/66  Pulse: 83 82 82 91  Resp: 19 16 16 14   Temp: 98 F (36.7 C) 98 F (36.7 C) 98.6 F (37 C) 98.2 F (36.8 C)  TempSrc: Oral Oral    SpO2: 97% 96% 100% 99%  Weight:      Height:         Body mass index is 26.4 kg/m.  Physical Exam   Constitutional: Appears well-developed and well-nourished.  Neurologic Examination    MS: Awake, alert, oriented.  She is able to spell world backwards, until the day at which point she repeats L she is unable to give the number of quarters in $2.75 CN: Pupils are equal and reactive Motor: She gives poor effort throughout, but is able to hold both arms without drift for giving way, she barely lift each leg against gravity. Sensory: Endorses symmetric sensation  Medications  Current Facility-Administered Medications:    0.9 %  sodium chloride  infusion (Manually program via Guardrails IV Fluids), , Intravenous, Once, Mansy, Jan A, MD   acetaminophen  (TYLENOL ) tablet 650 mg, 650 mg, Oral, Q6H PRN, Gordan Huxley, MD, 650 mg at 12/27/23 2121   albuterol  (PROVENTIL ) (2.5 MG/3ML) 0.083% nebulizer solution 2.5 mg, 2.5 mg, Nebulization, Q6H PRN, Belue, Rankin RAMAN, RPH   alum & mag hydroxide-simeth (MAALOX/MYLANTA) 200-200-20 MG/5ML suspension 15 mL, 15 mL, Oral, Q6H PRN, Awanda City, MD   atorvastatin  (LIPITOR) tablet 20 mg, 20 mg, Oral, Daily, Forbach, Cory, MD, 20 mg at 12/29/23 1009   calcium  acetate (PHOSLO ) capsule 1,334 mg, 1,334 mg, Oral, TID WC, Forbach, Cory, MD, 1,334 mg at 12/29/23 1009   calcium  carbonate (TUMS - dosed in mg elemental calcium ) chewable tablet 200 mg of elemental calcium , 1 tablet, Oral, TID  PRN, Awanda City, MD, 200 mg of elemental calcium  at 12/25/23 1023   Chlorhexidine  Gluconate Cloth 2 % PADS 6 each, 6 each, Topical, Daily, Awanda City, MD, 6 each at 12/28/23 0915   doxycycline  (VIBRAMYCIN ) 100 mg in sodium chloride  0.9 % 250 mL IVPB, 100 mg, Intravenous, Q12H, Ravishankar, Donald, MD, Stopped at 12/29/23 0411   feeding supplement (NEPRO CARB STEADY) liquid 237 mL, 237 mL, Oral, BID BM, Breeze, Shantelle, NP, 237 mL at 12/28/23 1041   fluticasone  (FLONASE ) 50 MCG/ACT nasal spray 2 spray, 2 spray, Each Nare, Daily, Gordan Huxley, MD, 2 spray at 12/29/23 1010   folic acid  (FOLVITE ) tablet 1 mg, 1 mg, Oral, Daily, Gordan Huxley, MD, 1 mg at 12/29/23 1010   haloperidol  lactate (HALDOL ) injection 1-2 mg, 1-2 mg, Intramuscular, Q6H PRN, Mansy, Jan A, MD   heparin  injection 5,000 Units, 5,000 Units, Subcutaneous, Q8H, Paudel, Keshab, MD, 5,000 Units at 12/29/23 0620   hydrOXYzine  (ATARAX ) tablet 25 mg, 25 mg, Oral, TID PRN, Forbach, Cory, MD, 25 mg at 12/28/23 2339   leptospermum manuka honey (MEDIHONEY) paste 1 Application, 1 Application, Topical, Daily, Tobie Calix, MD, 1 Application at 12/29/23 1010   levETIRAcetam  (KEPPRA ) tablet 500 mg, 500 mg, Oral, BID, [name], MD, 500 mg at 12/29/23 1009   levothyroxine  (SYNTHROID ) tablet 187.5 mcg, 187.5 mcg, Oral, Q Austin Nada Adriana JONETTA, RPH, 187.5 mcg at 12/25/23 0520  lipase/protease/amylase (CREON ) capsule 12,000 Units, 12,000 Units, Oral, TID WC, Gordan Huxley, MD, 12,000 Units at 12/29/23 1009   loratadine  (CLARITIN ) tablet 10 mg, 10 mg, Oral, Daily, Gordan Huxley, MD, 10 mg at 12/29/23 1010   melatonin tablet 5 mg, 5 mg, Oral, QHS, Chappell, Alex B, RPH, 5 mg at 12/27/23 2010   [COMPLETED] meropenem  (MERREM ) 1 g in sodium chloride  0.9 % 100 mL IVPB, 1 g, Intravenous, Once, Stopped at 12/28/23 0312 **FOLLOWED BY** meropenem  (MERREM ) 500 mg in sodium chloride  0.9 % 100 mL IVPB, 500 mg, Intravenous, Q24H, Dail Rankin RAMAN, RPH,  Stopped at 12/28/23 2303   midodrine  (PROAMATINE ) tablet 5 mg, 5 mg, Oral, TID WC, Awanda City, MD, 5 mg at 12/29/23 1010   multivitamin (RENA-VIT) tablet 1 tablet, 1 tablet, Oral, QHS, Gordan Huxley, MD, 1 tablet at 12/27/23 2011   mupirocin  ointment (BACTROBAN ) 2 % 1 Application, 1 Application, Nasal, BID, Awanda City, MD, 1 Application at 12/29/23 1010   ondansetron  (ZOFRAN ) tablet 4 mg, 4 mg, Oral, Q6H PRN **OR** ondansetron  (ZOFRAN ) injection 4 mg, 4 mg, Intravenous, Q6H PRN, Paudel, Keshab, MD   Oral care mouth rinse, 15 mL, Mouth Rinse, PRN, Awanda City, MD   pantoprazole  (PROTONIX ) EC tablet 40 mg, 40 mg, Oral, Daily, Forbach, Cory, MD, 40 mg at 12/29/23 1009   polyethylene glycol (MIRALAX  / GLYCOLAX ) packet 17 g, 17 g, Oral, Daily PRN, Paudel, Keshab, MD   [START ON 12/30/2023] predniSONE  (DELTASONE ) tablet 60 mg, 60 mg, Oral, Q breakfast, Tobie Calix, MD   sodium chloride  flush (NS) 0.9 % injection 10-40 mL, 10-40 mL, Intracatheter, Q12H, Awanda City, MD, 10 mL at 12/29/23 1010   sodium chloride  flush (NS) 0.9 % injection 10-40 mL, 10-40 mL, Intracatheter, PRN, Awanda City, MD   zinc  sulfate (50mg  elemental zinc ) capsule 220 mg, 220 mg, Oral, Daily, Forbach, Cory, MD, 220 mg at 12/29/23 1009  Facility-Administered Medications Ordered in Other Encounters:    capsaicin  topical system 8 % patch 4 patch, 4 patch, Topical, Once, Marcelino Nurse, MD  Labs and Diagnostic Imaging   Imaging(Personally reviewed): MRI brain stable since March  Assessment   Kaileena Blannie Shedlock is a 58 y.o. female with altered mental status in the setting of fevers, mild leukopenia, previous meningitis.  Her daughter does note that each time she becomes infected, she does become confused and therefore I do wonder about recrudescence of underlying deficits in the setting of acute physiological stress.  She does have an elevated double-stranded DNA and certainly lupus is a consideration, however I am not certain, even if she  has lupus, that she has true CNS involvement given that she has become confused repeatedly in the past as well.  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   MRI shows no significant change from March.   Lupus involving the central nervous system is sometimes treated with aggressive immune suppression such as Cytoxan, but in the setting of her hematologic abnormalities, I would be hesitant to use something that could further contribute to this.  Rituximab  may be one consideration, but she is currently pending transfer to [facility] and therefore I would favor deferring for now and continuing steroids.  Recommendations  Agree with continuing oral steroids following pulsed dose Solu-Medrol  ______________________________________________________________________   Bonney Aisha Seals,  MD [facility]

## 2023-12-29 NOTE — Progress Notes (Signed)
 [facility]  ROUNDING NOTE   Subjective:   Patient has been admitted under observation for Physical deconditioning [R53.81] Failure to thrive in adult [R62.7] Chronic kidney disease on chronic dialysis (HCC) [N18.6, Z99.2] Community acquired pneumonia of left lower lobe of lung [J18.9] AMS (altered mental status) [R41.82]  Patient seen resting quietly in bed Easily aroused Alert No complaints to offer  Objective:  Vital signs in last 24 hours:  Temp:  [98 F (36.7 C)-99.4 F (37.4 C)] 98.2 F (36.8 C) (09/04 0854) Pulse Rate:  [78-94] 91 (09/04 0854) Resp:  [12-20] 14 (09/04 0854) BP: (99-132)/(57-82) 127/66 (09/04 0854) SpO2:  [93 %-100 %] 99 % (09/04 0854)  Weight change:  Filed Weights   12/23/23 0810 12/26/23 0805 12/26/23 1325  Weight: 89.5 kg 87.3 kg 88.3 kg    Intake/Output: I/O last 3 completed shifts: In: 1827.9 [P.O.:360; Blood:630; IV Piggyback:837.9] Out: 200 [Other:200]   Intake/Output this shift:  No intake/output data recorded.  Physical Exam: General: Ill appearing  Head: Normocephalic  Eyes: Anicteric  Lungs:  Clear to auscultation, normal effort  Heart: Regular rate   Abdomen:  Soft, nontender  Extremities:  No peripheral edema.  Neurologic: Easily aroused  Skin: No lesions  Access: Rt AVF    Basic Metabolic Panel: Recent Labs  Lab 12/23/23 0818 12/24/23 1341 12/25/23 0454 12/26/23 0544 12/27/23 2225  NA 138 135 141 139 136  K 4.9 4.5 4.5 4.6 4.1  CL 94* 95* 97* 95* 94*  CO2 25 26 26 24 26   GLUCOSE 116* 99 124* 153* 197*  BUN 58* 43* 55* 71* 46*  CREATININE 8.67* 6.61* 7.98* 10.24* 7.91*  CALCIUM  8.3* 8.2* 8.0* 7.9* 7.3*  MG  --   --   --  2.3  --   PHOS 5.2*  --   --   --   --     Liver Function Tests: Recent Labs  Lab 12/23/23 0818 12/24/23 1341 12/25/23 0454 12/27/23 2225  AST  --  118* 105* 88*  ALT  --  26 23 25   ALKPHOS  --  55 50 52  BILITOT  --  1.2 0.9 0.7  PROT  --  7.3 6.3* 5.6*  ALBUMIN   2.5* 2.6* 2.5* 2.1*   No results for input(s): LIPASE, AMYLASE in the last 168 hours.  Recent Labs  Lab 12/24/23 1341 12/28/23 1715  AMMONIA 15 16    CBC: Recent Labs  Lab 12/23/23 0818 12/24/23 1341 12/25/23 0454 12/26/23 0544 12/27/23 2221 12/29/23 0859  WBC 3.8* 3.8* 3.7* 3.9* 2.9* 3.7*  NEUTROABS 3.1 3.8  --   --  2.4  --   HGB 7.9* 9.0* 7.2* 8.3* 6.7* 8.8*  HCT 22.5* 29.5* 22.7* 24.7* 19.9* 13.7*  MCV 99.1 92.5 93.0 95.4 97.5 89.0  PLT 51* 53* 51* 56* 54* 51*    Cardiac Enzymes: Recent Labs  Lab 12/24/23 1341  CKTOTAL 445*    BNP: Invalid input(s): POCBNP  CBG: Recent Labs  Lab 12/24/23 1335 12/26/23 1039  GLUCAP 103* 164*     Microbiology: Results for orders placed or performed during the hospital encounter of 12/16/23  Resp panel by RT-PCR (RSV, Flu A&B, Covid) Anterior Nasal Swab     Status: None   Collection Time: 12/19/23  4:21 PM   Specimen: Anterior Nasal Swab  Result Value Ref Range Status   SARS Coronavirus 2 by RT PCR NEGATIVE NEGATIVE Final    Comment: (NOTE) SARS-CoV-2 target nucleic acids are NOT DETECTED.  The SARS-CoV-2 RNA is generally detectable in upper respiratory specimens during the acute phase of infection. The lowest concentration of SARS-CoV-2 viral copies this assay can detect is 138 copies/mL. A negative result does not preclude SARS-Cov-2 infection and should not be used as the sole basis for treatment or other patient management decisions. A negative result may occur with  improper specimen collection/handling, submission of specimen other than nasopharyngeal swab, presence of viral mutation(s) within the areas targeted by this assay, and inadequate number of viral copies(<138 copies/mL). A negative result must be combined with clinical observations, patient history, and epidemiological information. The expected result is Negative.  Fact Sheet for Patients:  BloggerCourse.com  Fact  Sheet for Healthcare Providers:  SeriousBroker.it  This test is no t yet approved or cleared by the [place]  FDA and  has been authorized for detection and/or diagnosis of SARS-CoV-2 by FDA under an Emergency Use Authorization (EUA). This EUA will remain  in effect (meaning this test can be used) for the duration of the COVID-19 declaration under Section 564(b)(1) of the Act, 21 U.S.C.section 360bbb-3(b)(1), unless the authorization is terminated  or revoked sooner.       Influenza A by PCR NEGATIVE NEGATIVE Final   Influenza B by PCR NEGATIVE NEGATIVE Final    Comment: (NOTE) The Xpert Xpress SARS-CoV-2/FLU/RSV plus assay is intended as an aid in the diagnosis of influenza from Nasopharyngeal swab specimens and should not be used as a sole basis for treatment. Nasal washings and aspirates are unacceptable for Xpert Xpress SARS-CoV-2/FLU/RSV testing.  Fact Sheet for Patients: BloggerCourse.com  Fact Sheet for Healthcare Providers: SeriousBroker.it  This test is not yet approved or cleared by the [place]  FDA and has been authorized for detection and/or diagnosis of SARS-CoV-2 by FDA under an Emergency Use Authorization (EUA). This EUA will remain in effect (meaning this test can be used) for the duration of the COVID-19 declaration under Section 564(b)(1) of the Act, 21 U.S.C. section 360bbb-3(b)(1), unless the authorization is terminated or revoked.     Resp Syncytial Virus by PCR NEGATIVE NEGATIVE Final    Comment: (NOTE) Fact Sheet for Patients: BloggerCourse.com  Fact Sheet for Healthcare Providers: SeriousBroker.it  This test is not yet approved or cleared by the [place]  FDA and has been authorized for detection and/or diagnosis of SARS-CoV-2 by FDA under an Emergency Use Authorization (EUA). This EUA will remain in effect  (meaning this test can be used) for the duration of the COVID-19 declaration under Section 564(b)(1) of the Act, 21 U.S.C. section 360bbb-3(b)(1), unless the authorization is terminated or revoked.  Performed at [facility], [address] [place], [place] [address]   Group A Strep by PCR [facility] Only)     Status: None   Collection Time: 12/19/23  4:21 PM   Specimen: Anterior Nasal Swab; Sterile Swab  Result Value Ref Range Status   Group A Strep by PCR NOT DETECTED NOT DETECTED Final    Comment: Performed at [facility], [address] [place], [place] [address]  Resp panel by RT-PCR (RSV, Flu A&B, Covid) Anterior Nasal Swab     Status: None   Collection Time: 12/24/23  1:39 PM   Specimen: Anterior Nasal Swab  Result Value Ref Range Status   SARS Coronavirus 2 by RT PCR NEGATIVE NEGATIVE Final    Comment: (NOTE) SARS-CoV-2 target nucleic acids are NOT DETECTED.  The SARS-CoV-2 RNA is generally detectable in upper respiratory specimens during the acute phase of infection.  The lowest concentration of SARS-CoV-2 viral copies this assay can detect is 138 copies/mL. A negative result does not preclude SARS-Cov-2 infection and should not be used as the sole basis for treatment or other patient management decisions. A negative result may occur with  improper specimen collection/handling, submission of specimen other than nasopharyngeal swab, presence of viral mutation(s) within the areas targeted by this assay, and inadequate number of viral copies(<138 copies/mL). A negative result must be combined with clinical observations, patient history, and epidemiological information. The expected result is Negative.  Fact Sheet for Patients:  BloggerCourse.com  Fact Sheet for Healthcare Providers:  SeriousBroker.it  This test is no t yet approved or cleared by the [place]  FDA and  has been authorized for  detection and/or diagnosis of SARS-CoV-2 by FDA under an Emergency Use Authorization (EUA). This EUA will remain  in effect (meaning this test can be used) for the duration of the COVID-19 declaration under Section 564(b)(1) of the Act, 21 U.S.C.section 360bbb-3(b)(1), unless the authorization is terminated  or revoked sooner.       Influenza A by PCR NEGATIVE NEGATIVE Final   Influenza B by PCR NEGATIVE NEGATIVE Final    Comment: (NOTE) The Xpert Xpress SARS-CoV-2/FLU/RSV plus assay is intended as an aid in the diagnosis of influenza from Nasopharyngeal swab specimens and should not be used as a sole basis for treatment. Nasal washings and aspirates are unacceptable for Xpert Xpress SARS-CoV-2/FLU/RSV testing.  Fact Sheet for Patients: BloggerCourse.com  Fact Sheet for Healthcare Providers: SeriousBroker.it  This test is not yet approved or cleared by the [place]  FDA and has been authorized for detection and/or diagnosis of SARS-CoV-2 by FDA under an Emergency Use Authorization (EUA). This EUA will remain in effect (meaning this test can be used) for the duration of the COVID-19 declaration under Section 564(b)(1) of the Act, 21 U.S.C. section 360bbb-3(b)(1), unless the authorization is terminated or revoked.     Resp Syncytial Virus by PCR NEGATIVE NEGATIVE Final    Comment: (NOTE) Fact Sheet for Patients: BloggerCourse.com  Fact Sheet for Healthcare Providers: SeriousBroker.it  This test is not yet approved or cleared by the [place]  FDA and has been authorized for detection and/or diagnosis of SARS-CoV-2 by FDA under an Emergency Use Authorization (EUA). This EUA will remain in effect (meaning this test can be used) for the duration of the COVID-19 declaration under Section 564(b)(1) of the Act, 21 U.S.C. section 360bbb-3(b)(1), unless the authorization is  terminated or revoked.  Performed at [facility], [address] [place], [place] [address]   Blood Culture (routine x 2)     Status: None   Collection Time: 12/24/23  1:41 PM   Specimen: BLOOD  Result Value Ref Range Status   Specimen Description BLOOD BLOOD LEFT FOREARM  Final   Special Requests   Final    BOTTLES DRAWN AEROBIC AND ANAEROBIC Blood Culture adequate volume   Culture   Final    NO GROWTH 5 DAYS Performed at [facility], [address] [place], [place] [address]    Report Status 12/29/2023 FINAL  Final  Blood Culture (routine x 2)     Status: None   Collection Time: 12/24/23  1:41 PM   Specimen: BLOOD LEFT HAND  Result Value Ref Range Status   Specimen Description BLOOD LEFT HAND  Final   Special Requests   Final    BOTTLES DRAWN AEROBIC AND ANAEROBIC Blood Culture results may not be optimal due to  an inadequate volume of blood received in culture bottles   Culture   Final    NO GROWTH 5 DAYS Performed at [facility], [address] [place], [place] [address]    Report Status 12/29/2023 FINAL  Final  MRSA Next Gen by PCR, Nasal     Status: Abnormal   Collection Time: 12/24/23  5:49 PM   Specimen: Nasal Mucosa; Nasal Swab  Result Value Ref Range Status   MRSA by PCR Next Gen DETECTED (A) NOT DETECTED Final    Comment: RESULT CALLED TO, READ BACK BY AND VERIFIED WITH: AMY MCCRAY RN 12/24/23 1937 KG (NOTE) The GeneXpert MRSA Assay (FDA approved for NASAL specimens only), is one component of a comprehensive MRSA colonization surveillance program. It is not intended to diagnose MRSA infection nor to guide or monitor treatment for MRSA infections. Test performance is not FDA approved in patients less than 50 years old. Performed at [facility], [address] [place], [place] [address]   Respiratory (~20 pathogens) panel by PCR     Status: None   Collection Time: 12/25/23  7:20 PM   Specimen: Nasopharyngeal Swab;  Respiratory  Result Value Ref Range Status   Adenovirus NOT DETECTED NOT DETECTED Final   Coronavirus 229E NOT DETECTED NOT DETECTED Final    Comment: (NOTE) The Coronavirus on the Respiratory Panel, DOES NOT test for the novel  Coronavirus (2019 nCoV)    Coronavirus HKU1 NOT DETECTED NOT DETECTED Final   Coronavirus NL63 NOT DETECTED NOT DETECTED Final   Coronavirus OC43 NOT DETECTED NOT DETECTED Final   Metapneumovirus NOT DETECTED NOT DETECTED Final   Rhinovirus / Enterovirus NOT DETECTED NOT DETECTED Final   Influenza A NOT DETECTED NOT DETECTED Final   Influenza B NOT DETECTED NOT DETECTED Final   Parainfluenza Virus 1 NOT DETECTED NOT DETECTED Final   Parainfluenza Virus 2 NOT DETECTED NOT DETECTED Final   Parainfluenza Virus 3 NOT DETECTED NOT DETECTED Final   Parainfluenza Virus 4 NOT DETECTED NOT DETECTED Final   Respiratory Syncytial Virus NOT DETECTED NOT DETECTED Final   Bordetella pertussis NOT DETECTED NOT DETECTED Final   Bordetella Parapertussis NOT DETECTED NOT DETECTED Final   Chlamydophila pneumoniae NOT DETECTED NOT DETECTED Final   Mycoplasma pneumoniae NOT DETECTED NOT DETECTED Final    Comment: Performed at [facility], [address], [place], [place] [address]  Culture, blood (Routine X 2) w Reflex to ID Panel     Status: None (Preliminary result)   Collection Time: 12/27/23 10:21 PM   Specimen: BLOOD  Result Value Ref Range Status   Specimen Description BLOOD BLOOD LEFT HAND  Final   Special Requests   Final    BOTTLES DRAWN AEROBIC ONLY Blood Culture results may not be optimal due to an inadequate volume of blood received in culture bottles   Culture   Final    NO GROWTH 2 DAYS Performed at [facility], [address], [place], [place] [address]    Report Status PENDING  Incomplete  Culture, blood (Routine X 2) w Reflex to ID Panel     Status: None (Preliminary result)   Collection Time: 12/27/23 10:25 PM   Specimen: BLOOD  Result  Value Ref Range Status   Specimen Description BLOOD RIGHT  Final   Special Requests   Final    BOTTLES DRAWN AEROBIC AND ANAEROBIC Blood Culture results may not be optimal due to an inadequate volume of blood received in culture bottles  Culture   Final    NO GROWTH 2 DAYS Performed at [facility], [address] [place], [place] [address]    Report Status PENDING  Incomplete    Coagulation Studies: No results for input(s): LABPROT, INR in the last 72 hours.   Urinalysis: No results for input(s): COLORURINE, LABSPEC, PHURINE, GLUCOSEU, HGBUR, BILIRUBINUR, KETONESUR, PROTEINUR, UROBILINOGEN, NITRITE, LEUKOCYTESUR in the last 72 hours.  Invalid input(s): APPERANCEUR     Imaging: EEG adult Result Date: 12/28/2023 [name], MD     12/28/2023  1:09 PM History: 58 year old female with a history of seizures presenting with confusion, rule out seizures EEG Duration: 22 minutes Sedation: None Patient State: Awake and drowsy Technique: This EEG was acquired with electrodes placed according to the International 10-20 electrode system (including Fp1, Fp2, F3, F4, C3, C4, P3, P4, O1, O2, T3, T4, T5, T6, A1, A2, Fz, Cz, Pz). The following electrodes were missing or displaced: none. Background: There is a posterior dominant rhythm that achieved a maximal frequency of 8 Hz, but the EEG is predominated by generalized irregular delta and theta range activities.  There are no rhythmic discharges, epileptiform discharges, or other features of epileptic concern. Photic stimulation: Physiologic driving is not performed EEG Abnormalities: 1) generalized irregular slow activity Clinical Interpretation: This EEG is consistent with a generalized non-specific cerebral dysfunction(encephalopathy). There was no seizure or seizure predisposition recorded on this study. Please note that lack of epileptiform activity on EEG does not preclude the possibility of epilepsy.  [name], MD [facility] If 7pm- 7am, please page neurology on call as listed in AMION.  MR BRAIN WO CONTRAST Result Date: 12/28/2023 EXAM: MRI BRAIN WITHOUT CONTRAST 12/28/2023 06:38:00 AM TECHNIQUE: Multiplanar multisequence MRI of the head/brain was performed without the administration of intravenous contrast. COMPARISON: MRI of the head dated 07/25/2023. CLINICAL HISTORY: 58 y.o. female with hx of autoimmune disease, including lupus, presents with altered mental status and recent pneumonia. FINDINGS: BRAIN AND VENTRICLES: No acute infarct. No intracranial hemorrhage. No mass. No midline shift. No hydrocephalus. The sella is unremarkable. Normal flow voids. Moderate periventricular and deep cerebral white matter disease present. ORBITS: No acute abnormality. SINUSES AND MASTOIDS: Extensive opacification of the paranasal sinuses with polypoid mucosal thickening present within the maxillary sinuses bilaterally. BONES AND SOFT TISSUES: Normal marrow signal. No acute soft tissue abnormality. IMPRESSION: 1. No acute intracranial abnormality. 2. Moderate periventricular and deep cerebral white matter disease. 3. Extensive opacification of the paranasal sinuses with polypoid mucosal thickening within the maxillary sinuses bilaterally. Electronically signed by: Timothy Berrigan MD 12/28/2023 06:50 AM EDT RP Workstation: [id number]      Medications:    doxycycline  (VIBRAMYCIN ) IV Stopped (12/29/23 0411)   meropenem  (MERREM ) IV Stopped (12/28/23 2303)   methylPREDNISolone  (SOLU-MEDROL ) injection 1,000 mg (12/29/23 1015)     sodium chloride    Intravenous Once   atorvastatin   20 mg Oral Daily   calcium  acetate  1,334 mg Oral TID WC   Chlorhexidine  Gluconate Cloth  6 each Topical Daily   feeding supplement (NEPRO CARB STEADY)  237 mL Oral BID BM   fluticasone   2 spray Each Nare Daily   folic acid   1 mg Oral Daily   heparin   5,000 Units Subcutaneous Q8H   leptospermum manuka honey   1 Application Topical Daily   levETIRAcetam   500 mg Oral BID   levothyroxine   187.5 mcg Oral Q Sun   lipase/protease/amylase  12,000 Units Oral TID WC   loratadine   10 mg Oral  Daily   melatonin  5 mg Oral QHS   midodrine   5 mg Oral TID WC   multivitamin  1 tablet Oral QHS   mupirocin  ointment  1 Application Nasal BID   pantoprazole   40 mg Oral Daily   [START ON 12/30/2023] predniSONE   60 mg Oral Q breakfast   sodium chloride  flush  10-40 mL Intracatheter Q12H   zinc  sulfate (50mg  elemental zinc )  220 mg Oral Daily   acetaminophen , albuterol , alum & mag hydroxide-simeth, calcium  carbonate, haloperidol  lactate, hydrOXYzine , ondansetron  **OR** ondansetron  (ZOFRAN ) IV, mouth rinse, polyethylene glycol, sodium chloride  flush  Assessment/ Plan:  Ms. Patty Leitzke is a 58 y.o.  female past medical history of end-stage renal disease on hemodialysis Monday Wednesday Friday schedule, ANCA related glomerulonephritis, diet-controlled diabetes, orthostatic hypotension and thrombocytopenia and CLL now admitted with complaints on generalized weakness and inability to care for herself. She is now being treated with antibiotics for pneumonia.   [facility]/MWF/Rt AVF   Generalized weakness in the setting of ESRD on hemodialysis: Dialysis received yesterday, UF 0 due to hypotension. Next treatment scheduled for Friday.    ANEMIA with chronic kidney disease: Avoiding ESA due to CLL. Will obtain updated labs with dialysis. Hgb 8.8 today, was scheduled for blood transfusion during dialysis however was not available. Patient received 2 units overnight.    Secondary Hyperparathyroidism: with outpatient labs: PTH 568, phosphorus 5.1, calcium  9.0 on 11/28/23. Currently prescribed calcitriol and calcium  acetate outpatient. Binders  with meals.   4. Hypertension with chronic kidney disease. Diuretics held. Receiving Midodrine  only. Blood pressure 127/66   5. Diabetes mellitus type II with chronic  kidney disease/renal manifestations: noninsulin dependent. Most recent hemoglobin A1c is 5.6 on 07/29/23.   Diet controlled  6. Fever, unknown origin. Blood cultures negative. Sepsis ruled out, no source of infection.        LOS: 4 Makiyah Zentz 9/4/202510:51 AM

## 2023-12-29 NOTE — Progress Notes (Signed)
 Physical Therapy Treatment Patient Details Name: Tonya Myers MRN: [id number] DOB: 1965/05/06 Today's Date: 12/29/2023   History of Present Illness Pt is a 58 y.o. female who returns to the ED for generalized weakness and inability to care for herself. She was just recently hospitalized with multiple issues but most specifically pneumonia. 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 She is A and O x 3. Does seem to lack full insight of deficits. Agreeable to OOB activity. Endorses feeling cold. Some UE tremors observed but seem to resolve with/ during standing activity. Pt was able to exit L side of bed, stand 2 x EOB prior to taking steps to recliner. Pt has several loose Bms upon standing. Completely unaware she was having a BM. RN tech came to assist after 2nd BM. Pt demonstrates strength required to ambulate > distances but was unable this session due to frequency of Bms. Acute PT will continue to follow and progress per current POC. Per chart, pt awaiting transfer to [facility].    If plan is discharge home, recommend the following: A lot of help with walking and/or transfers;A lot of help with bathing/dressing/bathroom;Assistance with cooking/housework;Direct supervision/assist for medications management;Direct supervision/assist for financial management;Assist for transportation;Help with stairs or ramp for entrance;Supervision due to cognitive status     Equipment Recommendations  Other (comment) (Defer to next level of  care)       Precautions / Restrictions Precautions Precautions: Fall Recall of Precautions/Restrictions: Impaired Precaution/Restrictions Comments: Monitor BP; L fem CVC Restrictions Weight Bearing Restrictions Per Provider Order: No     Mobility  Bed Mobility Overal bed mobility: Needs Assistance Bed Mobility: Supine to Sit, Rolling Rolling: Min assist Supine to sit: Min assist, Mod assist, HOB elevated   Transfers Overall transfer level: Needs assistance Equipment used: Rolling walker (2 wheels) Transfers: Sit to/from Stand Sit to Stand: Min assist, Mod assist  General transfer comment: First 2 x STS was from elevated EOB surface to RW. Next 3 STS from recliner surface to RW with min-mod assist of 1.    Ambulation/Gait Ambulation/Gait assistance: Min assist Gait Distance (Feet): 3 Feet Assistive device: Rolling walker (2 wheels) Gait Pattern/deviations: Step-to pattern, Step-through pattern  General Gait Details: Pt was easily able to take steps form EOB to recliner, distance severely limited by loose/ Bms upon standing each attempt. RN tech in room doing bath at conclusion of session   Balance Overall balance assessment: Needs assistance Sitting-balance support: Bilateral upper extremity supported, Feet supported Sitting balance-Leahy Scale: Good     Standing balance support: Bilateral upper extremity supported, Reliant on assistive device for balance Standing balance-Leahy Scale: Fair    Hotel manager: Impaired  Cognition Arousal: Alert Behavior During Therapy: Flat affect   PT - Cognitive impairments: Safety/Judgement, Problem solving, Awareness (Pt is A and O x 3. Cognition much improved from last tim author attempted to treat ~ 5 days prior.)    PT - Cognition Comments: Pt's congition has not been consistent throughout admission Following commands: Intact      Cueing Cueing Techniques: Verbal cues, Tactile cues  Pertinent Vitals/Pain Pain Assessment Pain Assessment: 0-10 Pain Score: 8  Pain Location: sacral wound pain with hygiene care after multiple BMs     PT Goals (current goals can now be found in the care plan section) Acute Rehab PT Goals Patient Stated Goal: to get better Progress towards PT goals: Progressing toward goals    Frequency    Min 2X/week       Co-evaluation     PT goals addressed during session: Mobility/safety with mobility;Balance;Proper use of DME;Strengthening/ROM        AM-PAC PT 6 Clicks Mobility   Outcome Measure  Help needed turning from your back to your side while in a flat bed without using bedrails?: A Little Help needed moving from lying on your back to sitting on the side of a flat bed without using bedrails?: A Little Help needed moving to and from a bed to a chair (including a wheelchair)?: A Lot Help needed standing up from a chair using your arms (e.g., wheelchair or bedside chair)?: A Lot Help needed to walk in hospital room?: A Lot Help needed climbing 3-5 steps with a railing? : A Lot 6 Click Score: 14    End of Session   Activity Tolerance: Patient tolerated treatment well Patient left: in chair;with call bell/phone within reach;with chair alarm set;with nursing/sitter in room (RN and RN tech in room) Nurse Communication: Mobility status PT Visit Diagnosis: Unsteadiness on feet (R26.81);Other abnormalities of gait and mobility (R26.89);History of falling (Z91.81);Muscle weakness (generalized) (M62.81);Difficulty in walking, not elsewhere classified (R26.2)     Time: 8853-8785 PT Time Calculation (min) (ACUTE ONLY): 28 min  Charges:    $Therapeutic Activity: 23-37 mins PT General Charges $$ ACUTE PT VISIT: 1 Visit                     Rankin Essex PTA 12/29/23, 1:12 PM

## 2023-12-29 NOTE — TOC Progression Note (Signed)
 Transition of Care [facility]) - Progression Note    Patient Details  Name: Tonya Myers MRN: [id number] Date of Birth: 06/02/1965  Transition of [facility]) [name] Contact  Marinda Cooks, RN Phone Number: 12/29/2023, 12:21 PM  Clinical Narrative:    This CM updated that pt transferred to [facility]   as a  hospital to hospital transfer for further testing with her potential diagnosis and involved level of care. TOC will cont follow dc planning / carecoordination and update as applicable.                      Expected Discharge Plan and Services                                               Social Drivers of Health (SDOH) Interventions SDOH Screenings   Food Insecurity: No Food Insecurity (12/11/2023)  Housing: Low Risk  (12/11/2023)  Transportation Needs: Unmet Transportation Needs (12/11/2023)  Utilities: Not At Risk (12/11/2023)  Depression (PHQ2-9): Low Risk  (12/06/2023)  Financial Resource Strain: High Risk (07/06/2023)   Received from [facility]  Social Connections: Unknown (08/12/2023)  Tobacco Use: Low Risk  (12/16/2023)    Readmission Risk Interventions    12/24/2021   12:03 PM  Readmission Risk Prevention Plan  Transportation Screening Complete  Medication Review (RN Care Manager) Complete  PCP or Specialist appointment within 3-5 days of discharge Complete  HRI or Home Care Consult Complete  SW Recovery Care/Counseling Consult Complete  Palliative Care Screening Not Applicable  Skilled Nursing Facility Complete

## 2023-12-30 ENCOUNTER — Other Ambulatory Visit: Payer: Self-pay | Admitting: Student in an Organized Health Care Education/Training Program

## 2023-12-30 DIAGNOSIS — G053 Encephalitis and encephalomyelitis in diseases classified elsewhere: Secondary | ICD-10-CM

## 2023-12-30 DIAGNOSIS — R739 Hyperglycemia, unspecified: Secondary | ICD-10-CM

## 2023-12-30 DIAGNOSIS — E1140 Type 2 diabetes mellitus with diabetic neuropathy, unspecified: Secondary | ICD-10-CM

## 2023-12-30 DIAGNOSIS — R202 Paresthesia of skin: Secondary | ICD-10-CM

## 2023-12-30 DIAGNOSIS — G894 Chronic pain syndrome: Secondary | ICD-10-CM

## 2023-12-30 DIAGNOSIS — N186 End stage renal disease: Secondary | ICD-10-CM | POA: Diagnosis not present

## 2023-12-30 DIAGNOSIS — A419 Sepsis, unspecified organism: Secondary | ICD-10-CM | POA: Diagnosis not present

## 2023-12-30 DIAGNOSIS — G9341 Metabolic encephalopathy: Secondary | ICD-10-CM | POA: Diagnosis not present

## 2023-12-30 DIAGNOSIS — I7782 Antineutrophilic cytoplasmic antibody (ANCA) vasculitis: Secondary | ICD-10-CM | POA: Diagnosis not present

## 2023-12-30 LAB — RENAL FUNCTION PANEL
Albumin: 2.4 g/dL — ABNORMAL LOW (ref 3.5–5.0)
Anion gap: 17 — ABNORMAL HIGH (ref 5–15)
BUN: 63 mg/dL — ABNORMAL HIGH (ref 6–20)
CO2: 23 mmol/L (ref 22–32)
Calcium: 7.6 mg/dL — ABNORMAL LOW (ref 8.9–10.3)
Chloride: 90 mmol/L — ABNORMAL LOW (ref 98–111)
Creatinine, Ser: 6.77 mg/dL — ABNORMAL HIGH (ref 0.44–1.00)
GFR, Estimated: 6 mL/min — ABNORMAL LOW (ref >60)
Glucose, Bld: 636 mg/dL (ref 70–99)
Phosphorus: 5.8 mg/dL — ABNORMAL HIGH (ref 2.5–4.6)
Potassium: 4.5 mmol/L (ref 3.5–5.1)
Sodium: 130 mmol/L — ABNORMAL LOW (ref 135–145)

## 2023-12-30 LAB — CBC
HCT: 28.9 % — CL (ref 36.0–46.0)
HCT: 24 % — ABNORMAL LOW (ref 36.0–46.0)
Hemoglobin: 9 g/dL — ABNORMAL LOW (ref 12.0–15.0)
Hemoglobin: 8 g/dL — ABNORMAL LOW (ref 12.0–15.0)
MCH: 57.1 pg — AB (ref 26.0–34.0)
MCH: 29.1 pg (ref 26.0–34.0)
MCHC: 64.2 g/dL — AB (ref 30.0–36.0)
MCHC: 33.3 g/dL (ref 30.0–36.0)
MCV: 90.3 fL (ref 80.0–100.0)
MCV: 87.3 fL (ref 80.0–100.0)
Platelets: 51 K/uL — ABNORMAL LOW (ref 150–400)
Platelets: 50 K/uL — ABNORMAL LOW (ref 150–400)
RBC: 3.2 MIL/uL — ABNORMAL LOW (ref 3.87–5.11)
RBC: 2.75 MIL/uL — ABNORMAL LOW (ref 3.87–5.11)
RDW: 18.1 % — ABNORMAL HIGH (ref 11.5–15.5)
RDW: 18.5 % — ABNORMAL HIGH (ref 11.5–15.5)
WBC: 4.2 K/uL — AB (ref 4.0–10.5)
WBC: 4.5 K/uL (ref 4.0–10.5)
nRBC: 0 % (ref 0.0–0.2)
nRBC: 0 % (ref 0.0–0.2)

## 2023-12-30 LAB — C4 COMPLEMENT: Complement C4, Body Fluid: 9 mg/dL — ABNORMAL LOW (ref 12–38)

## 2023-12-30 LAB — HSV 1/2 PCR (SURFACE)
HSV-1 DNA: NOT DETECTED
HSV-2 DNA: DETECTED — AB

## 2023-12-30 LAB — GLUCOSE, CAPILLARY
Glucose-Capillary: 444 mg/dL — ABNORMAL HIGH (ref 70–99)
Glucose-Capillary: 293 mg/dL — ABNORMAL HIGH (ref 70–99)
Glucose-Capillary: 270 mg/dL — ABNORMAL HIGH (ref 70–99)
Glucose-Capillary: 292 mg/dL — ABNORMAL HIGH (ref 70–99)
Glucose-Capillary: 296 mg/dL — ABNORMAL HIGH (ref 70–99)

## 2023-12-30 LAB — CYTOMEGALOVIRUS DNA, QUANTITATIVE REAL-TIME PCR, PLASMA
CMV DNA Quant: NEGATIVE [IU]/mL
Log10 CMV Qn DNA Pl: UNDETERMINED {Log_IU}/mL

## 2023-12-30 LAB — HEMOGLOBIN A1C
Hgb A1c MFr Bld: 6.9 % — ABNORMAL HIGH (ref 4.8–5.6)
Mean Plasma Glucose: 151 mg/dL

## 2023-12-30 LAB — EPSTEIN BARR VRS(EBV DNA BY PCR): EBV DNA QN by PCR: NEGATIVE [IU]/mL

## 2023-12-30 LAB — FERRITIN: Ferritin: >7500 ng/mL — ABNORMAL HIGH (ref 11–307)

## 2023-12-30 LAB — C3 COMPLEMENT: C3 Complement: 32 mg/dL — ABNORMAL LOW (ref 82–167)

## 2023-12-30 MED ORDER — INSULIN ASPART 100 UNIT/ML IJ SOLN
0.0000 [IU] | Freq: Three times a day (TID) | INTRAMUSCULAR | Status: DC
  Administered 2023-12-30 (×2): 5 [IU] via SUBCUTANEOUS
  Administered 2023-12-31: 7 [IU] via SUBCUTANEOUS
  Administered 2023-12-31: 9 [IU] via SUBCUTANEOUS
  Filled 2023-12-30 (×4): qty 1

## 2023-12-30 MED ORDER — SULFAMETHOXAZOLE-TRIMETHOPRIM 400-80 MG PO TABS
1.0000 | ORAL_TABLET | ORAL | Status: DC
  Administered 2023-12-30: 1 via ORAL
  Filled 2023-12-30: qty 1

## 2023-12-30 MED ORDER — INSULIN ASPART 100 UNIT/ML IJ SOLN
0.0000 [IU] | Freq: Every day | INTRAMUSCULAR | Status: DC
  Administered 2023-12-30: 3 [IU] via SUBCUTANEOUS
  Filled 2023-12-30: qty 1

## 2023-12-30 MED ORDER — HYDRALAZINE HCL 20 MG/ML IJ SOLN: 10.0000 mg | Freq: Four times a day (QID) | INTRAMUSCULAR | Status: DC | PRN

## 2023-12-30 MED ORDER — INSULIN ASPART 100 UNIT/ML IJ SOLN
15.0000 [IU] | Freq: Once | INTRAMUSCULAR | Status: AC
  Administered 2023-12-30: 15 [IU] via SUBCUTANEOUS
  Filled 2023-12-30 (×2): qty 0.15

## 2023-12-30 MED ORDER — MIDODRINE HCL 5 MG PO TABS
2.5000 mg | ORAL_TABLET | ORAL | Status: DC
  Administered 2023-12-30: 2.5 mg via ORAL

## 2023-12-30 MED ORDER — AMLODIPINE BESYLATE 10 MG PO TABS
10.0000 mg | ORAL_TABLET | Freq: Every day | ORAL | Status: DC
  Administered 2023-12-30 – 2023-12-31 (×2): 10 mg via ORAL
  Filled 2023-12-30 (×2): qty 1

## 2023-12-30 NOTE — Plan of Care (Signed)
  Problem: Activity: Goal: Ability to tolerate increased activity will improve Outcome: Progressing   Problem: Clinical Measurements: Goal: Ability to maintain a body temperature in the normal range will improve Outcome: Progressing   Problem: Respiratory: Goal: Ability to maintain adequate ventilation will improve Outcome: Progressing Goal: Ability to maintain a clear airway will improve Outcome: Progressing   Problem: Education: Goal: Knowledge of General Education information will improve Description: Including pain rating scale, medication(s)/side effects and non-pharmacologic comfort measures Outcome: Progressing   Problem: Health Behavior/Discharge Planning: Goal: Ability to manage health-related needs will improve Outcome: Progressing   Problem: Clinical Measurements: Goal: Ability to maintain clinical measurements within normal limits will improve Outcome: Progressing Goal: Will remain free from infection Outcome: Progressing Goal: Diagnostic test results will improve Outcome: Progressing Goal: Respiratory complications will improve Outcome: Progressing Goal: Cardiovascular complication will be avoided Outcome: Progressing   Problem: Activity: Goal: Risk for activity intolerance will decrease Outcome: Progressing   Problem: Nutrition: Goal: Adequate nutrition will be maintained Outcome: Progressing   Problem: Coping: Goal: Level of anxiety will decrease Outcome: Progressing   Problem: Elimination: Goal: Will not experience complications related to bowel motility Outcome: Progressing Goal: Will not experience complications related to urinary retention Outcome: Progressing   Problem: Pain Managment: Goal: General experience of comfort will improve and/or be controlled Outcome: Progressing   Problem: Safety: Goal: Ability to remain free from injury will improve Outcome: Progressing   Problem: Skin Integrity: Goal: Risk for impaired skin integrity  will decrease Outcome: Progressing   Problem: Education: Goal: Ability to describe self-care measures that may prevent or decrease complications (Diabetes Survival Skills Education) will improve Outcome: Progressing Goal: Individualized Educational Video(s) Outcome: Progressing   Problem: Coping: Goal: Ability to adjust to condition or change in health will improve Outcome: Progressing   Problem: Fluid Volume: Goal: Ability to maintain a balanced intake and output will improve Outcome: Progressing   Problem: Health Behavior/Discharge Planning: Goal: Ability to identify and utilize available resources and services will improve Outcome: Progressing Goal: Ability to manage health-related needs will improve Outcome: Progressing   Problem: Metabolic: Goal: Ability to maintain appropriate glucose levels will improve Outcome: Progressing   Problem: Nutritional: Goal: Maintenance of adequate nutrition will improve Outcome: Progressing Goal: Progress toward achieving an optimal weight will improve Outcome: Progressing   Problem: Skin Integrity: Goal: Risk for impaired skin integrity will decrease Outcome: Progressing   Problem: Tissue Perfusion: Goal: Adequacy of tissue perfusion will improve Outcome: Progressing

## 2023-12-30 NOTE — Progress Notes (Signed)
 Date of Admission:  12/16/2023    ID: Tonya Myers is a 58 y.o. female  Principal Problem:   AMS (altered mental status) Active Problems:   Hypothyroidism   Acute metabolic encephalopathy   Thrombocytopenia (HCC)   Dyslipidemia   Type 2 diabetes mellitus with peripheral neuropathy (HCC)   End-stage renal disease on hemodialysis (HCC)   Sepsis (HCC)   Chronic kidney disease on chronic dialysis (HCC)   Systemic lupus erythematosus (HCC)   ANCA-positive vasculitis (HCC)   HLH (hemophagocytic lymphohistiocytosis) (HCC)   Herpes simplex infection of perianal skin  Patient is known to me from previous admissions in 2022 She is a limited historian Chart reviewed in detail Reviewed care everywhere and records from [facility] [place] as well. Tonya Myers is a 58 y.o. 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, extensive thoracic space infection underwent laminectomy of T3-T4, T5 T7, T9-T10 and evacuation of abscess and treated with appropriate Antibiotics for more than 6 weeks,, diagnosed with PE and correlated vasculitis and AKI in December 13 2198 received high-dose steroids and rituximab  and later had to go on dialysis, seizure disorder recurrent hospitalizations the entire year of 2023 at [facility] for encephalopathy, seizures and had lumbar puncture with increasing protein Never been on treatment for ANCA vasculitis or a positive ANA with  dsDNA which is Lupus   Had seen rheumatology in the past at [facility] but not as outpatient, has been followed by neurology as outpatient for the seizures and peripheral neuropathy Was recently in [facility] between 12/11/2023 until 12/13/2023 for encephalopathy and treatment of pneumonia and there was a plan to discharge her to skilled nursing facility but she went home.  But she came back to the ED on 12/15/2023 as she could not manage at home by herself. She  stayed in the ED from 12/15/2023 until today waiting for a SNF placement.  Today she was sent to the hospitalist site for admission because she had low BP and suspect sepsis was question  -----------------------------------------------------------------------  Patient is doing so much better After receiving 3 doses of solumedrol she is on prednisone  60mg   Blood sugar very high  Medications:   amLODipine   10 mg Oral Daily   atorvastatin   20 mg Oral Daily   calcium  acetate  1,334 mg Oral TID WC   Chlorhexidine  Gluconate Cloth  6 each Topical Daily   feeding supplement (NEPRO CARB STEADY)  237 mL Oral BID BM   fluticasone   2 spray Each Nare Daily   folic acid   1 mg Oral Daily   heparin   5,000 Units Subcutaneous Q8H   insulin  aspart  0-5 Units Subcutaneous QHS   insulin  aspart  0-9 Units Subcutaneous TID WC   leptospermum manuka honey  1 Application Topical Daily   levETIRAcetam   500 mg Oral BID   levothyroxine   187.5 mcg Oral Q Sun   lipase/protease/amylase  12,000 Units Oral TID WC   loratadine   10 mg Oral Daily   melatonin  5 mg Oral QHS   midodrine   2.5 mg Oral Q M,W,F-HD   multivitamin  1 tablet Oral QHS   mupirocin  ointment  1 Application Nasal BID   nystatin   5 mL Oral QID   pantoprazole   40 mg Oral Daily   predniSONE   60 mg Oral Q breakfast   sodium chloride  flush  10-40 mL Intracatheter Q12H   sulfamethoxazole -trimethoprim   1 tablet Oral Once  per day on Monday Wednesday Friday   valACYclovir   500 mg Oral QHS   zinc  sulfate (50mg  elemental zinc )  220 mg Oral Daily    Objective: Vital signs in last 24 hours: Patient Vitals for the past 24 hrs:  BP Temp Temp src Pulse Resp SpO2 Weight  12/30/23 1255 (!) 178/88 -- -- -- -- -- --  12/30/23 1050 (!) 182/81 98.2 F (36.8 C) Oral -- 14 100 % 90.1 kg  12/30/23 1042 -- -- -- 80 17 93 % --  12/30/23 1030 (!) 183/79 -- -- 87 19 93 % --  12/30/23 1000 (!) 162/82 -- -- 87 (!) 21 95 % --  12/30/23 0930 (!) 171/83 -- -- (!) 123 14  97 % --  12/30/23 0900 (!) 166/93 -- -- 80 (!) 25 100 % --  12/30/23 0830 (!) 175/101 -- -- -- (!) 23 92 % --  12/30/23 0800 (!) 169/76 -- -- 87 (!) 21 97 % --  12/30/23 0747 (!) 173/85 -- -- 82 (!) 27 94 % --  12/30/23 0730 (!) 181/86 97.8 F (36.6 C) Oral 81 18 95 % 90.1 kg  12/30/23 0334 (!) 175/73 98.1 F (36.7 C) -- 88 20 94 % --  12/29/23 2106 (!) 144/69 98.3 F (36.8 C) Oral 87 20 95 % --  12/29/23 1552 113/61 99.2 F (37.3 C) Oral 88 16 96 % --    she is awake and  alert- responds to questions appropriately b Oriented in place person year month and date Chronically ill-appearing Oral cavity has Toro on the roof with some ulceration of the mucosa Chest bilateral air entry Heart sound S1-S2 Abdomen soft She has ulcerating lesions on her perineal area and intergluteal area CNS is able to move all her limbs  Lab Results    Latest Ref Rng & Units 12/30/2023    7:40 AM 12/29/2023    8:59 AM 12/29/2023    3:31 AM  CBC  WBC 4.0 - 10.5 K/uL 4.5  4.2  C 4.1   Hemoglobin 12.0 - 15.0 g/dL 8.0  9.0  C 7.3   Hematocrit 36.0 - 46.0 % 24.0  28.9  C 21.9   Platelets 150 - 400 K/uL 50  49  C 62     C Corrected result       Latest Ref Rng & Units 12/30/2023    7:40 AM 12/27/2023   10:25 PM 12/26/2023    5:44 AM  CMP  Glucose 70 - 99 mg/dL 363  802  846   BUN 6 - 20 mg/dL 63  46  71   Creatinine 0.44 - 1.00 mg/dL 3.22  2.08  89.75   Sodium 135 - 145 mmol/L 130  136  139   Potassium 3.5 - 5.1 mmol/L 4.5  4.1  4.6   Chloride 98 - 111 mmol/L 90  94  95   CO2 22 - 32 mmol/L 23  26  24    Calcium  8.9 - 10.3 mg/dL 7.6  7.3  7.9   Total Protein 6.5 - 8.1 g/dL  5.6    Total Bilirubin 0.0 - 1.2 mg/dL  0.7    Alkaline Phos 38 - 126 U/L  52    AST 15 - 41 U/L  88    ALT 0 - 44 U/L  25      Ferritin > 7500  Microbiology: 12/24/23 BC- NG 12/27/23 BC NG     Assessment/Plan: Encephalopathy - resolved after starting high dose  solumedrol Concern for lupus CNS involvement patient has had  fluctuating altered mental status for the past 3 years she is alert at one time and then more sleepy at other times She has had multiple lumbar punctures  since 2022 ,In  February 2022 she had pneumococcal meningitis and after that she has not had any evidence of pleocytosis in 4 different lumbar puncture  done after that She has had some discordant increase in protein.  She had been followed by neurology in the past.  There was a concern at one time for lupus cerebritis but it was inconclusive and no treatment was given  On 12/11/2023 ANA was  positive again  and double-stranded DNA was elevated at 300 which is extremely high compared to 11 in 2022. With delirium, encephalopathy, cytopenia, fever > DSDNA, low c3 and c4   SLE is the cause of all her symptoms including CNS like encephalaopathy  Now wih serum ferritin > 7500 need to rule out HLH ( hemophagocytic lymphohistiocytosis) She was seen by rheumatology and received 1 g of Solu-Medrol  daily for 3 doses and her mental status is dramatically improved.  She is now  on 60 mg of prednisone   Started Bactrim  for  PCP prophylaxis   Severe hyperglycemia  BS > 600 from steroids- she is currently on sliding scale and managed by hospitalist   Pancytopenia HLH is questioned which is secondary to lupus  She received blood transfusion Hemoglobin has improved Platelet and WBC are slightly improved   She also has a diagnosis of a seizure disorder and was on antiseizure meds but she discontinued it herself even before her neurologist appointment in June 2025  Appreciate neuroconsult started on Keppra  EEG did not show any specific seizure pattern  I started her her meropenem /Doxy  repeat blood cultures have been negative.  And the central line has been removed from the femoral area.  She has not had any fever since Solu-Medrol  has been started and her mental status has also improved pretty dramatically.   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 Oral cavity mucosal inflammation- for Nystatin  If no improvement will  do fluconazole   P ANCA vasculitis causing end-stage renal disease She had a renal biopsy in August 2022 and that showed 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 Patient at that time took 3 doses of high-dose steroids and rituximab  in 2022   H/o CLL  chronic calcified pancreatitis   History of Treated pneumococcal meningitis and thoracic spine infection in 2022    Social issues.  Patient lives on her own and she is unable to cope on her own.     Pt  to be transferred to [facility] for management of her complicated condition - in their wait list   Discussed the management with patient, her friend at bed side  and hospitalist and the nurse  ________________________________________________ ID will not see her this weekend On call physician available for urgent issues by phone- call if needed

## 2023-12-30 NOTE — Inpatient Diabetes Management (Signed)
 Inpatient Diabetes Program Recommendations  AACE/ADA: New Consensus Statement on Inpatient Glycemic Control (2015)  Target Ranges:  Prepandial:   less than 140 mg/dL      Peak postprandial:   less than 180 mg/dL (1-2 hours)      Critically ill patients:  140 - 180 mg/dL   Lab Results  Component Value Date   GLUCAP 444 (H) 12/30/2023   HGBA1C 5.6 07/29/2023    Review of Glycemic Control  Latest Reference Range & Units 12/30/23 08:33  Glucose-Capillary 70 - 99 mg/dL 555 (H)  (H): Data is abnormally high Diabetes history: Type 2 DM Outpatient Diabetes medications: none Current orders for Inpatient glycemic control: Novolog  15 units x 1 Solumedrol 1000 mg tapered to Prednisone  60 mg QD  Inpatient Diabetes Program Recommendations:    Consider adding Novolog  0-6 units TID and A1C (if to remain inpatient)  Thanks, Tinnie Minus, MSN, RNC-OB Diabetes Coordinator [phone] (8a-5p)

## 2023-12-30 NOTE — Progress Notes (Signed)
 PT Cancellation Note  Patient Details Name: Tonya Myers MRN: [id number] DOB: 04-Aug-1965   Cancelled Treatment:    Reason Eval/Treat Not Completed: Fatigue/lethargy limiting ability to participate. Upon entry to room pt declining PT. Reports fatigue from HD earlier today and is wanting to eat a meal. Discussed continued OOB mobility to tolerance with pt and visitor in agreement to partake in t/f to recliner with NSG staff later today when pt is ready.    Dorina HERO. Fairly IV, PT, DPT Physical Therapist- [facility] 12/30/2023, 2:17 PM

## 2023-12-30 NOTE — Progress Notes (Signed)
 Bld glucose from lab 636.  Finger stick 444.  15u novolog  ordered and given

## 2023-12-30 NOTE — Progress Notes (Signed)
 [facility]  - [place] [facility]   PATIENT NAME: Tonya Myers    MR#:  [id number]  DATE OF BIRTH:  1965-06-04  SUBJECTIVE:  no family at bedside. Spoke with dter Lawernce patient more conversing today. Ate breakfast.  Did work with PT to sit in the recliner. Answered most of the questions quite appropriately.   VITALS:  Blood pressure (!) 178/88, pulse 80, temperature 98.2 F (36.8 C), temperature source Oral, resp. rate 14, height 6' (1.829 m), weight 90.1 kg, SpO2 100%.  PHYSICAL EXAMINATION:   GENERAL:  58 y.o.-year-old patient with no acute distress.  LUNGS: Normal breath sounds bilaterally, no wheezing CARDIOVASCULAR: S1, S2 normal. No murmur   ABDOMEN: Soft, nontender, nondistended.  EXTREMITIES: No  edema b/l.   HD access+ NEUROLOGIC: nonfocal  patient is alert and awake  Pt has 2 areas of pressure injuries which have progressed to Unstageable; sacrum 2X.2cm, yellow moist slough and right buttock Unstageable yellow moist slough .8X.8cm. Lighter colored scar tissue surrounding from a previous wound to the sacrum which healed, according to the patient.   LABORATORY PANEL:  CBC Recent Labs  Lab 12/30/23 0740  WBC 4.5  HGB 8.0*  HCT 24.0*  PLT 50*    Chemistries  Recent Labs  Lab 12/26/23 0544 12/27/23 2225 12/30/23 0740  NA 139 136 130*  K 4.6 4.1 4.5  CL 95* 94* 90*  CO2 24 26 23   GLUCOSE 153* 197* 636*  BUN 71* 46* 63*  CREATININE 10.24* 7.91* 6.77*  CALCIUM  7.9* 7.3* 7.6*  MG 2.3  --   --   AST  --  88*  --   ALT  --  25  --   ALKPHOS  --  52  --   BILITOT  --  0.7  --     RADIOLOGY:  No results found.   Assessment and Plan  Suhey Kaylise Blakeley is a pleasant 58 y.o. female with medical history significant for ESRD on hemodialysis, CLL in remission, thrombocytopenia, history of strep pneumonia meningitis, s/p multilevel laminectomy, HLD, HTN, DM, depression, anxiety, hypothyroidism, recent treatment of pneumonia and  discharged home on 12/13/2023 and came to emergency room on 12/15/2023, who was found to have acute change in mental status today and hospitalist service was consulted for admission.  On 12/15/2023 patient came back from home concerning for generalized pain and weakness not able to function at home.  That time patient was seen by hospitalist in the ED and advised that patient does not need admission as there is no acute issues.  Patient's vital signs at the time was temperature 98.1, respiration 16 heart rate 86, blood pressure 113/90, SpO2 98% on room air. During her ER stay, patient had a low-grade temperature, hypotension and acute change in mental status.  Nephrology seen the patient and advised for starting 250 cc bolus of normal saline, small dose of midodrine  for hypotension, chest x-ray reported by radiologist for possible infiltrate.  Etiology of low-grade fevers uncertain.  Hospitalist service was consulted for evaluation for admission for acute change in mental status and possible concern for sepsis.   Acute change in mental status  --etiology unclear at this time.  has had fluctuating altered mental status for the past 3 years.  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  She has had some discordant  increase in protein.  She had been followed by neurology in the past.  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 --On 12/11/2023 ANA was positive again  and double-stranded DNA was elevated at >300 which is extremely high compared to 11 in 2022.  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 Neurology recommends transfer to tertiary care center --Consulted Rheumatology with Dr CHRISTELLA Blanch-- patient currently on high doses of IV steroid--now on po steroid 60 mg  every day --mentation improving   Lupus --After discussing with Rheum Dr. Blanch and ID Dr. Fayette, it was decided that pt likely has Lupus. --start IV solumedrol 1000 mg daily for 3 days, per Rheum rec.--change to po prednisone  60 mg /every day from tomrrow --high levels of Ferriritn and pancytopenia worrisome for HLH/MAS --D/w Dr Jacobo (on the phone)-- who reported patients [place] biopsy in April 2025 was essentially negative for CLL. He recommends continue prednisone  above dosing at present. No urgent indication for bone marrow biopsy.    Sepsis ruled out Recurrent fevers --no source of infection currently.  Recurrent fevers now thought to be due to lupus. -- Per ID currently on empiric doxycycline  and Meropenem  --pt will need midline and removal of femoral line to prevent CLABSI --ok with Dr Marcelino to get midline --9/5-- IV meropenem  and doxycyline d/ced --HSV DNA 2 PCR positive--Valtrex  every day --long term --PCP proph with SS Bactrim  per ID   PNA, ruled out - Chest x-ray showed New left basilar patchy opacities, however, no symptoms of PNA.     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   HLD - Continue on Lipitor   Hypothyroidism - Continue on levothyroxine    GERD - Continue Protonix    Pancytopenia  --all 3 cell lines have decreased since a month ago.  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 Jacobo on 11/25/23.   Hx of CLL --in complete remission, last saw Onc Dr. Jacobo on 11/25/23.  Spoke with Dr Almarie Levels at [facility] who has accepted pt and she is on  waitlist for now. Pt's dter aware of transfer process     DVT prophylaxis: Heparin  SQ Code Status: DNR  Family Communication: daughter updated on the phone today-Level of care: Med-Surg Status is: Inpatient Remains inpatient appropriate because: complex medical issues, waxing and waning mentation. Awaiting transfer to [facility]!    TOTAL TIME TAKING CARE OF THIS PATIENT: 45 minutes.  >50% time spent on counselling and coordination of care  Note: This dictation was prepared with Dragon dictation along with smaller phrase technology. Any transcriptional errors that result from this process are unintentional.  Leita Blanch M.D    [facility]   CC: Primary care physician; Valora Agent, MD

## 2023-12-30 NOTE — Progress Notes (Signed)
  Received patient in bed to unit.   Informed consent signed and in chart.    TX duration: 2hrs Pt was put into recirculation d/t possible clotting, tx restarted after 5 min then machine alarmed flow balance error with a stop treatment screen.  NP made aware and tx discontinued     Transported back to floor  Hand-off given to patient's nurse. No c/o and no acute distress noted    Access used  R AVG Access issues: none   Total UF removed: none Medication(s) given: insulin  Post HD VS: wnl Post HD weight: 90.1kg     Olivia Hurst LPN [facility]

## 2023-12-30 NOTE — Plan of Care (Signed)
  Problem: Activity: Goal: Ability to tolerate increased activity will improve Outcome: Progressing   Problem: Clinical Measurements: Goal: Ability to maintain a body temperature in the normal range will improve Outcome: Progressing   Problem: Respiratory: Goal: Ability to maintain adequate ventilation will improve Outcome: Progressing   Problem: Health Behavior/Discharge Planning: Goal: Ability to manage health-related needs will improve Outcome: Progressing   Problem: Clinical Measurements: Goal: Will remain free from infection Outcome: Progressing

## 2023-12-30 NOTE — Progress Notes (Signed)
 [facility]  ROUNDING NOTE   Subjective:   Patient has been admitted under observation for Physical deconditioning [R53.81] Failure to thrive in adult [R62.7] Chronic kidney disease on chronic dialysis (HCC) [N18.6, Z99.2] Community acquired pneumonia of left lower lobe of lung [J18.9] AMS (altered mental status) [R41.82]  Patient seen and evaluated during dialysis   HEMODIALYSIS FLOWSHEET:  Blood Flow Rate (mL/min): 399 mL/min Arterial Pressure (mmHg): -216.15 mmHg Venous Pressure (mmHg): 200.59 mmHg TMP (mmHg): 11.11 mmHg Ultrafiltration Rate (mL/min): 666 mL/min Dialysate Flow Rate (mL/min): 300 ml/min Dialysis Fluid Bolus: Normal Saline Bolus Amount (mL): 200 mL  Appears restless  Objective:  Vital signs in last 24 hours:  Temp:  [97.8 F (36.6 C)-99.2 F (37.3 C)] 97.8 F (36.6 C) (09/05 0730) Pulse Rate:  [80-123] 87 (09/05 1000) Resp:  [14-27] 21 (09/05 1000) BP: (113-181)/(61-101) 171/83 (09/05 0930) SpO2:  [92 %-100 %] 95 % (09/05 1000) Weight:  [90.1 kg] 90.1 kg (09/05 0730)  Weight change:  Filed Weights   12/26/23 0805 12/26/23 1325 12/30/23 0730  Weight: 87.3 kg 88.3 kg 90.1 kg    Intake/Output: I/O last 3 completed shifts: In: 1233.8 [Blood:630; IV Piggyback:603.8] Out: -    Intake/Output this shift:  No intake/output data recorded.  Physical Exam: General: Ill appearing, restless  Head: Normocephalic  Eyes: Anicteric  Lungs:  Clear to auscultation, normal effort  Heart: Regular rate   Abdomen:  Soft, nontender  Extremities:  No peripheral edema.  Neurologic: Easily aroused  Skin: No lesions  Access: Rt AVF    Basic Metabolic Panel: Recent Labs  Lab 12/24/23 1341 12/25/23 0454 12/26/23 0544 12/27/23 2225 12/30/23 0740  NA 135 141 139 136 130*  K 4.5 4.5 4.6 4.1 4.5  CL 95* 97* 95* 94* 90*  CO2 26 26 24 26 23   GLUCOSE 99 124* 153* 197* 636*  BUN 43* 55* 71* 46* 63*  CREATININE 6.61* 7.98* 10.24* 7.91* 6.77*   CALCIUM  8.2* 8.0* 7.9* 7.3* 7.6*  MG  --   --  2.3  --   --   PHOS  --   --   --   --  5.8*    Liver Function Tests: Recent Labs  Lab 12/24/23 1341 12/25/23 0454 12/27/23 2225 12/30/23 0740  AST 118* 105* 88*  --   ALT 26 23 25   --   ALKPHOS 55 50 52  --   BILITOT 1.2 0.9 0.7  --   PROT 7.3 6.3* 5.6*  --   ALBUMIN  2.6* 2.5* 2.1* 2.4*   No results for input(s): LIPASE, AMYLASE in the last 168 hours.  Recent Labs  Lab 12/24/23 1341 12/28/23 1715  AMMONIA 15 16    CBC: Recent Labs  Lab 12/24/23 1341 12/25/23 0454 12/26/23 0544 12/27/23 2221 12/29/23 0331 12/29/23 0859 12/30/23 0740  WBC 3.8*   < > 3.9* 2.9* 4.1 3.7* 4.5  NEUTROABS 3.8  --   --  2.4  --   --   --   HGB 9.0*   < > 8.3* 6.7* 7.3* 8.8* 8.0*  HCT 29.5*   < > 24.7* 19.9* 21.9* 13.7* 24.0*  MCV 92.5   < > 95.4 97.5 91.6 89.0 87.3  PLT 53*   < > 56* 54* 62* 51* 50*   < > = values in this interval not displayed.    Cardiac Enzymes: Recent Labs  Lab 12/24/23 1341  CKTOTAL 445*    BNP: Invalid input(s): POCBNP  CBG: Recent Labs  Lab 12/24/23 1335 12/26/23 1039 12/30/23 0833 12/30/23 0956  GLUCAP 103* 164* 444* 293*     Microbiology: Results for orders placed or performed during the hospital encounter of 12/16/23  Resp panel by RT-PCR (RSV, Flu A&B, Covid) Anterior Nasal Swab     Status: None   Collection Time: 12/19/23  4:21 PM   Specimen: Anterior Nasal Swab  Result Value Ref Range Status   SARS Coronavirus 2 by RT PCR NEGATIVE NEGATIVE Final    Comment: (NOTE) SARS-CoV-2 target nucleic acids are NOT DETECTED.  The SARS-CoV-2 RNA is generally detectable in upper respiratory specimens during the acute phase of infection. The lowest concentration of SARS-CoV-2 viral copies this assay can detect is 138 copies/mL. A negative result does not preclude SARS-Cov-2 infection and should not be used as the sole basis for treatment or other patient management decisions. A negative  result may occur with  improper specimen collection/handling, submission of specimen other than nasopharyngeal swab, presence of viral mutation(s) within the areas targeted by this assay, and inadequate number of viral copies(<138 copies/mL). A negative result must be combined with clinical observations, patient history, and epidemiological information. The expected result is Negative.  Fact Sheet for Patients:  BloggerCourse.com  Fact Sheet for Healthcare Providers:  SeriousBroker.it  This test is no t yet approved or cleared by the [place]  FDA and  has been authorized for detection and/or diagnosis of SARS-CoV-2 by FDA under an Emergency Use Authorization (EUA). This EUA will remain  in effect (meaning this test can be used) for the duration of the COVID-19 declaration under Section 564(b)(1) of the Act, 21 U.S.C.section 360bbb-3(b)(1), unless the authorization is terminated  or revoked sooner.       Influenza A by PCR NEGATIVE NEGATIVE Final   Influenza B by PCR NEGATIVE NEGATIVE Final    Comment: (NOTE) The Xpert Xpress SARS-CoV-2/FLU/RSV plus assay is intended as an aid in the diagnosis of influenza from Nasopharyngeal swab specimens and should not be used as a sole basis for treatment. Nasal washings and aspirates are unacceptable for Xpert Xpress SARS-CoV-2/FLU/RSV testing.  Fact Sheet for Patients: BloggerCourse.com  Fact Sheet for Healthcare Providers: SeriousBroker.it  This test is not yet approved or cleared by the [place]  FDA and has been authorized for detection and/or diagnosis of SARS-CoV-2 by FDA under an Emergency Use Authorization (EUA). This EUA will remain in effect (meaning this test can be used) for the duration of the COVID-19 declaration under Section 564(b)(1) of the Act, 21 U.S.C. section 360bbb-3(b)(1), unless the authorization is  terminated or revoked.     Resp Syncytial Virus by PCR NEGATIVE NEGATIVE Final    Comment: (NOTE) Fact Sheet for Patients: BloggerCourse.com  Fact Sheet for Healthcare Providers: SeriousBroker.it  This test is not yet approved or cleared by the [place]  FDA and has been authorized for detection and/or diagnosis of SARS-CoV-2 by FDA under an Emergency Use Authorization (EUA). This EUA will remain in effect (meaning this test can be used) for the duration of the COVID-19 declaration under Section 564(b)(1) of the Act, 21 U.S.C. section 360bbb-3(b)(1), unless the authorization is terminated or revoked.  Performed at [facility], [address] [place], [place] [address]   Group A Strep by PCR [facility] Only)     Status: None   Collection Time: 12/19/23  4:21 PM   Specimen: Anterior Nasal Swab; Sterile Swab  Result Value Ref Range Status   Group A Strep by PCR NOT DETECTED NOT DETECTED Final  Comment: Performed at [facility], [address] [place], [place] [address]  Resp panel by RT-PCR (RSV, Flu A&B, Covid) Anterior Nasal Swab     Status: None   Collection Time: 12/24/23  1:39 PM   Specimen: Anterior Nasal Swab  Result Value Ref Range Status   SARS Coronavirus 2 by RT PCR NEGATIVE NEGATIVE Final    Comment: (NOTE) SARS-CoV-2 target nucleic acids are NOT DETECTED.  The SARS-CoV-2 RNA is generally detectable in upper respiratory specimens during the acute phase of infection. The lowest concentration of SARS-CoV-2 viral copies this assay can detect is 138 copies/mL. A negative result does not preclude SARS-Cov-2 infection and should not be used as the sole basis for treatment or other patient management decisions. A negative result may occur with  improper specimen collection/handling, submission of specimen other than nasopharyngeal swab, presence of viral mutation(s) within the areas targeted by  this assay, and inadequate number of viral copies(<138 copies/mL). A negative result must be combined with clinical observations, patient history, and epidemiological information. The expected result is Negative.  Fact Sheet for Patients:  BloggerCourse.com  Fact Sheet for Healthcare Providers:  SeriousBroker.it  This test is no t yet approved or cleared by the [place]  FDA and  has been authorized for detection and/or diagnosis of SARS-CoV-2 by FDA under an Emergency Use Authorization (EUA). This EUA will remain  in effect (meaning this test can be used) for the duration of the COVID-19 declaration under Section 564(b)(1) of the Act, 21 U.S.C.section 360bbb-3(b)(1), unless the authorization is terminated  or revoked sooner.       Influenza A by PCR NEGATIVE NEGATIVE Final   Influenza B by PCR NEGATIVE NEGATIVE Final    Comment: (NOTE) The Xpert Xpress SARS-CoV-2/FLU/RSV plus assay is intended as an aid in the diagnosis of influenza from Nasopharyngeal swab specimens and should not be used as a sole basis for treatment. Nasal washings and aspirates are unacceptable for Xpert Xpress SARS-CoV-2/FLU/RSV testing.  Fact Sheet for Patients: BloggerCourse.com  Fact Sheet for Healthcare Providers: SeriousBroker.it  This test is not yet approved or cleared by the [place]  FDA and has been authorized for detection and/or diagnosis of SARS-CoV-2 by FDA under an Emergency Use Authorization (EUA). This EUA will remain in effect (meaning this test can be used) for the duration of the COVID-19 declaration under Section 564(b)(1) of the Act, 21 U.S.C. section 360bbb-3(b)(1), unless the authorization is terminated or revoked.     Resp Syncytial Virus by PCR NEGATIVE NEGATIVE Final    Comment: (NOTE) Fact Sheet for Patients: BloggerCourse.com  Fact Sheet  for Healthcare Providers: SeriousBroker.it  This test is not yet approved or cleared by the [place]  FDA and has been authorized for detection and/or diagnosis of SARS-CoV-2 by FDA under an Emergency Use Authorization (EUA). This EUA will remain in effect (meaning this test can be used) for the duration of the COVID-19 declaration under Section 564(b)(1) of the Act, 21 U.S.C. section 360bbb-3(b)(1), unless the authorization is terminated or revoked.  Performed at [facility], [address] [place], [place] [address]   Blood Culture (routine x 2)     Status: None   Collection Time: 12/24/23  1:41 PM   Specimen: BLOOD  Result Value Ref Range Status   Specimen Description BLOOD BLOOD LEFT FOREARM  Final   Special Requests   Final    BOTTLES DRAWN AEROBIC AND ANAEROBIC Blood Culture adequate volume   Culture   Final  NO GROWTH 5 DAYS Performed at [facility], [address] [place], [place] [address]    Report Status 12/29/2023 FINAL  Final  Blood Culture (routine x 2)     Status: None   Collection Time: 12/24/23  1:41 PM   Specimen: BLOOD LEFT HAND  Result Value Ref Range Status   Specimen Description BLOOD LEFT HAND  Final   Special Requests   Final    BOTTLES DRAWN AEROBIC AND ANAEROBIC Blood Culture results may not be optimal due to an inadequate volume of blood received in culture bottles   Culture   Final    NO GROWTH 5 DAYS Performed at [facility], [address], [place], [place] [address]    Report Status 12/29/2023 FINAL  Final  MRSA Next Gen by PCR, Nasal     Status: Abnormal   Collection Time: 12/24/23  5:49 PM   Specimen: Nasal Mucosa; Nasal Swab  Result Value Ref Range Status   MRSA by PCR Next Gen DETECTED (A) NOT DETECTED Final    Comment: RESULT CALLED TO, READ BACK BY AND VERIFIED WITH: AMY MCCRAY RN 12/24/23 1937 KG (NOTE) The GeneXpert MRSA Assay (FDA approved for NASAL specimens  only), is one component of a comprehensive MRSA colonization surveillance program. It is not intended to diagnose MRSA infection nor to guide or monitor treatment for MRSA infections. Test performance is not FDA approved in patients less than 50 years old. Performed at [facility], [address] [place], [place] [address]   Respiratory (~20 pathogens) panel by PCR     Status: None   Collection Time: 12/25/23  7:20 PM   Specimen: Nasopharyngeal Swab; Respiratory  Result Value Ref Range Status   Adenovirus NOT DETECTED NOT DETECTED Final   Coronavirus 229E NOT DETECTED NOT DETECTED Final    Comment: (NOTE) The Coronavirus on the Respiratory Panel, DOES NOT test for the novel  Coronavirus (2019 nCoV)    Coronavirus HKU1 NOT DETECTED NOT DETECTED Final   Coronavirus NL63 NOT DETECTED NOT DETECTED Final   Coronavirus OC43 NOT DETECTED NOT DETECTED Final   Metapneumovirus NOT DETECTED NOT DETECTED Final   Rhinovirus / Enterovirus NOT DETECTED NOT DETECTED Final   Influenza A NOT DETECTED NOT DETECTED Final   Influenza B NOT DETECTED NOT DETECTED Final   Parainfluenza Virus 1 NOT DETECTED NOT DETECTED Final   Parainfluenza Virus 2 NOT DETECTED NOT DETECTED Final   Parainfluenza Virus 3 NOT DETECTED NOT DETECTED Final   Parainfluenza Virus 4 NOT DETECTED NOT DETECTED Final   Respiratory Syncytial Virus NOT DETECTED NOT DETECTED Final   Bordetella pertussis NOT DETECTED NOT DETECTED Final   Bordetella Parapertussis NOT DETECTED NOT DETECTED Final   Chlamydophila pneumoniae NOT DETECTED NOT DETECTED Final   Mycoplasma pneumoniae NOT DETECTED NOT DETECTED Final    Comment: Performed at [facility], [address] [place], [place] [address]  Culture, blood (Routine X 2) w Reflex to ID Panel     Status: None (Preliminary result)   Collection Time: 12/27/23 10:21 PM   Specimen: BLOOD  Result Value Ref Range Status   Specimen Description BLOOD BLOOD LEFT HAND  Final    Special Requests   Final    BOTTLES DRAWN AEROBIC ONLY Blood Culture results may not be optimal due to an inadequate volume of blood received in culture bottles   Culture   Final    NO GROWTH 3 DAYS Performed at [facility], [address] [place], [place] [address]    Report Status PENDING  Incomplete  Culture, blood (Routine X 2) w Reflex to ID Panel     Status: None (Preliminary result)   Collection Time: 12/27/23 10:25 PM   Specimen: BLOOD  Result Value Ref Range Status   Specimen Description BLOOD RIGHT  Final   Special Requests   Final    BOTTLES DRAWN AEROBIC AND ANAEROBIC Blood Culture results may not be optimal due to an inadequate volume of blood received in culture bottles   Culture   Final    NO GROWTH 3 DAYS Performed at [facility], [address] [place], [place] [address]    Report Status PENDING  Incomplete    Coagulation Studies: No results for input(s): LABPROT, INR in the last 72 hours.   Urinalysis: No results for input(s): COLORURINE, LABSPEC, PHURINE, GLUCOSEU, HGBUR, BILIRUBINUR, KETONESUR, PROTEINUR, UROBILINOGEN, NITRITE, LEUKOCYTESUR in the last 72 hours.  Invalid input(s): APPERANCEUR     Imaging: EEG adult Result Date: 12/28/2023 Tonya Tonya SQUIBB, MD     12/28/2023  1:09 PM History: 58 year old female with a history of seizures presenting with confusion, rule out seizures EEG Duration: 22 minutes Sedation: None Patient State: Awake and drowsy Technique: This EEG was acquired with electrodes placed according to the International 10-20 electrode system (including Fp1, Fp2, F3, F4, C3, C4, P3, P4, O1, O2, T3, T4, T5, T6, A1, A2, Fz, Cz, Pz). The following electrodes were missing or displaced: none. Background: There is a posterior dominant rhythm that achieved a maximal frequency of 8 Hz, but the EEG is predominated by generalized irregular delta and theta range activities.  There are no rhythmic  discharges, epileptiform discharges, or other features of epileptic concern. Photic stimulation: Physiologic driving is not performed EEG Abnormalities: 1) generalized irregular slow activity Clinical Interpretation: This EEG is consistent with a generalized non-specific cerebral dysfunction(encephalopathy). There was no seizure or seizure predisposition recorded on this study. Please note that lack of epileptiform activity on EEG does not preclude the possibility of epilepsy. Tonya Michaela, MD [facility] If 7pm- 7am, please page neurology on call as listed in AMION.     Medications:       amLODipine   10 mg Oral Daily   atorvastatin   20 mg Oral Daily   calcium  acetate  1,334 mg Oral TID WC   Chlorhexidine  Gluconate Cloth  6 each Topical Daily   feeding supplement (NEPRO CARB STEADY)  237 mL Oral BID BM   fluticasone   2 spray Each Nare Daily   folic acid   1 mg Oral Daily   heparin   5,000 Units Subcutaneous Q8H   insulin  aspart  0-5 Units Subcutaneous QHS   insulin  aspart  0-9 Units Subcutaneous TID WC   leptospermum manuka honey  1 Application Topical Daily   levETIRAcetam   500 mg Oral BID   levothyroxine   187.5 mcg Oral Q Sun   lipase/protease/amylase  12,000 Units Oral TID WC   loratadine   10 mg Oral Daily   melatonin  5 mg Oral QHS   midodrine   2.5 mg Oral Q M,W,F-HD   multivitamin  1 tablet Oral QHS   mupirocin  ointment  1 Application Nasal BID   nystatin   5 mL Oral QID   pantoprazole   40 mg Oral Daily   predniSONE   60 mg Oral Q breakfast   sodium chloride  flush  10-40 mL Intracatheter Q12H   valACYclovir   500 mg Oral QHS   zinc  sulfate (50mg  elemental zinc )  220 mg Oral Daily   acetaminophen , albuterol , alum & mag hydroxide-simeth, calcium  carbonate, hydrALAZINE , hydrOXYzine , ondansetron  **OR** ondansetron  (ZOFRAN ) IV, mouth rinse, polyethylene glycol, sodium chloride  flush  Assessment/ Plan:  [name] is a 58 y.o.  female past medical  history of end-stage renal disease on hemodialysis Monday Wednesday Friday schedule, ANCA related glomerulonephritis, diet-controlled diabetes, orthostatic hypotension and thrombocytopenia and CLL now admitted with complaints on generalized weakness and inability to care for herself. She is now being treated with antibiotics for pneumonia.   [facility] [place]/MWF/Rt AVF   Generalized weakness in the setting of ESRD on hemodialysis: Receiving dialysis today, UF increased to 1L. Next treatment scheduled for Monday.    ANEMIA with chronic kidney disease: Avoiding ESA due to CLL. Hgb 8.8 today, was scheduled for blood transfusion during dialysis however was not available. Patient received 2 units overnight.    Secondary Hyperparathyroidism: with outpatient labs: PTH 568, phosphorus 5.1, calcium  9.0 on 11/28/23. Currently prescribed calcitriol and calcium  acetate outpatient. Binders  with meals.   4. Hypertension with chronic kidney disease. Diuretics held. Receiving Midodrine  only. Blood pressure 127/66   5. 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 Most recent hemoglobin A1c is 5.6 on 07/29/23.   Diet controlled  6. Fever, unknown origin. Blood cultures negative. Sepsis ruled out, no source of infection. Awaiting transfer to [facility] for further work up      LOS: 5 [employer] 9/5/202510:21 AM

## 2023-12-31 DIAGNOSIS — A601 Herpesviral infection of perianal skin and rectum: Secondary | ICD-10-CM | POA: Diagnosis not present

## 2023-12-31 DIAGNOSIS — G053 Encephalitis and encephalomyelitis in diseases classified elsewhere: Secondary | ICD-10-CM

## 2023-12-31 DIAGNOSIS — D761 Hemophagocytic lymphohistiocytosis: Secondary | ICD-10-CM | POA: Diagnosis not present

## 2023-12-31 DIAGNOSIS — D696 Thrombocytopenia, unspecified: Secondary | ICD-10-CM

## 2023-12-31 DIAGNOSIS — M3219 Other organ or system involvement in systemic lupus erythematosus: Secondary | ICD-10-CM | POA: Diagnosis not present

## 2023-12-31 DIAGNOSIS — G9341 Metabolic encephalopathy: Secondary | ICD-10-CM | POA: Diagnosis not present

## 2023-12-31 LAB — GLUCOSE, CAPILLARY
Glucose-Capillary: 332 mg/dL — ABNORMAL HIGH (ref 70–99)
Glucose-Capillary: 401 mg/dL — ABNORMAL HIGH (ref 70–99)

## 2023-12-31 MED ORDER — PREDNISONE 20 MG PO TABS: 40.0000 mg | ORAL_TABLET | Freq: Every day | ORAL | 0 refills | Status: AC

## 2023-12-31 NOTE — Progress Notes (Signed)
 [facility]  ROUNDING NOTE   Subjective:   Patient has been admitted under observation for Physical deconditioning [R53.81] Failure to thrive in adult [R62.7] Chronic kidney disease on chronic dialysis (HCC) [N18.6, Z99.2] Community acquired pneumonia of left lower lobe of lung [J18.9] AMS (altered mental status) [R41.82]  Patient seen resting in bed Alert, speech remains delayed Room air No lower extremity edema  Objective:  Vital signs in last 24 hours:  Temp:  [97.8 F (36.6 C)-98.8 F (37.1 C)] 98.2 F (36.8 C) (09/06 0745) Pulse Rate:  [80-99] 95 (09/06 0745) Resp:  [14-21] 14 (09/06 0745) BP: (132-183)/(72-88) 145/72 (09/06 0745) SpO2:  [93 %-100 %] 99 % (09/06 0745) Weight:  [90.1 kg] 90.1 kg (09/05 1050)  Weight change:  Filed Weights   12/26/23 1325 12/30/23 0730 12/30/23 1050  Weight: 88.3 kg 90.1 kg 90.1 kg    Intake/Output: I/O last 3 completed shifts: In: 550 [P.O.:550] Out: 0    Intake/Output this shift:  No intake/output data recorded.  Physical Exam: General: Ill appearing, restless  Head: Normocephalic  Eyes: Anicteric  Lungs:  Clear to auscultation, normal effort  Heart: Regular rate   Abdomen:  Soft, nontender  Extremities:  No peripheral edema.  Neurologic: Easily aroused  Skin: No lesions  Access: Rt AVF    Basic Metabolic Panel: Recent Labs  Lab 12/24/23 1341 12/25/23 0454 12/26/23 0544 12/27/23 2225 12/30/23 0740  NA 135 141 139 136 130*  K 4.5 4.5 4.6 4.1 4.5  CL 95* 97* 95* 94* 90*  CO2 26 26 24 26 23   GLUCOSE 99 124* 153* 197* 636*  BUN 43* 55* 71* 46* 63*  CREATININE 6.61* 7.98* 10.24* 7.91* 6.77*  CALCIUM  8.2* 8.0* 7.9* 7.3* 7.6*  MG  --   --  2.3  --   --   PHOS  --   --   --   --  5.8*    Liver Function Tests: Recent Labs  Lab 12/24/23 1341 12/25/23 0454 12/27/23 2225 12/30/23 0740  AST 118* 105* 88*  --   ALT 26 23 25   --   ALKPHOS 55 50 52  --   BILITOT 1.2 0.9 0.7  --   PROT 7.3 6.3*  5.6*  --   ALBUMIN  2.6* 2.5* 2.1* 2.4*   No results for input(s): LIPASE, AMYLASE in the last 168 hours.  Recent Labs  Lab 12/24/23 1341 12/28/23 1715  AMMONIA 15 16    CBC: Recent Labs  Lab 12/24/23 1341 12/25/23 0454 12/26/23 0544 12/27/23 2221 12/29/23 0331 12/29/23 0859 12/30/23 0740  WBC 3.8*   < > 3.9* 2.9* 4.1 4.2 4.5  NEUTROABS 3.8  --   --  2.4  --   --   --   HGB 9.0*   < > 8.3* 6.7* 7.3* 9.0* 8.0*  HCT 29.5*   < > 24.7* 19.9* 21.9* 28.9* 24.0*  MCV 92.5   < > 95.4 97.5 91.6 90.3 87.3  PLT 53*   < > 56* 54* 62* 49* 50*   < > = values in this interval not displayed.    Cardiac Enzymes: Recent Labs  Lab 12/24/23 1341  CKTOTAL 445*    BNP: Invalid input(s): POCBNP  CBG: Recent Labs  Lab 12/30/23 0956 12/30/23 1245 12/30/23 1727 12/30/23 2119 12/31/23 0747  GLUCAP 293* 270* 292* 296* 332*     Microbiology: Results for orders placed or performed during the hospital encounter of 12/16/23  Resp panel by RT-PCR (RSV, Flu  A&B, Covid) Anterior Nasal Swab     Status: None   Collection Time: 12/19/23  4:21 PM   Specimen: Anterior Nasal Swab  Result Value Ref Range Status   SARS Coronavirus 2 by RT PCR NEGATIVE NEGATIVE Final    Comment: (NOTE) SARS-CoV-2 target nucleic acids are NOT DETECTED.  The SARS-CoV-2 RNA is generally detectable in upper respiratory specimens during the acute phase of infection. The lowest concentration of SARS-CoV-2 viral copies this assay can detect is 138 copies/mL. A negative result does not preclude SARS-Cov-2 infection and should not be used as the sole basis for treatment or other patient management decisions. A negative result may occur with  improper specimen collection/handling, submission of specimen other than nasopharyngeal swab, presence of viral mutation(s) within the areas targeted by this assay, and inadequate number of viral copies(<138 copies/mL). A negative result must be combined with clinical  observations, patient history, and epidemiological information. The expected result is Negative.  Fact Sheet for Patients:  BloggerCourse.com  Fact Sheet for Healthcare Providers:  SeriousBroker.it  This test is no t yet approved or cleared by the [place]  FDA and  has been authorized for detection and/or diagnosis of SARS-CoV-2 by FDA under an Emergency Use Authorization (EUA). This EUA will remain  in effect (meaning this test can be used) for the duration of the COVID-19 declaration under Section 564(b)(1) of the Act, 21 U.S.C.section 360bbb-3(b)(1), unless the authorization is terminated  or revoked sooner.       Influenza A by PCR NEGATIVE NEGATIVE Final   Influenza B by PCR NEGATIVE NEGATIVE Final    Comment: (NOTE) The Xpert Xpress SARS-CoV-2/FLU/RSV plus assay is intended as an aid in the diagnosis of influenza from Nasopharyngeal swab specimens and should not be used as a sole basis for treatment. Nasal washings and aspirates are unacceptable for Xpert Xpress SARS-CoV-2/FLU/RSV testing.  Fact Sheet for Patients: BloggerCourse.com  Fact Sheet for Healthcare Providers: SeriousBroker.it  This test is not yet approved or cleared by the [place]  FDA and has been authorized for detection and/or diagnosis of SARS-CoV-2 by FDA under an Emergency Use Authorization (EUA). This EUA will remain in effect (meaning this test can be used) for the duration of the COVID-19 declaration under Section 564(b)(1) of the Act, 21 U.S.C. section 360bbb-3(b)(1), unless the authorization is terminated or revoked.     Resp Syncytial Virus by PCR NEGATIVE NEGATIVE Final    Comment: (NOTE) Fact Sheet for Patients: BloggerCourse.com  Fact Sheet for Healthcare Providers: SeriousBroker.it  This test is not yet approved or cleared by  the [place]  FDA and has been authorized for detection and/or diagnosis of SARS-CoV-2 by FDA under an Emergency Use Authorization (EUA). This EUA will remain in effect (meaning this test can be used) for the duration of the COVID-19 declaration under Section 564(b)(1) of the Act, 21 U.S.C. section 360bbb-3(b)(1), unless the authorization is terminated or revoked.  Performed at [facility], [address] [place], [place] [address]   Group A Strep by PCR [facility] Only)     Status: None   Collection Time: 12/19/23  4:21 PM   Specimen: Anterior Nasal Swab; Sterile Swab  Result Value Ref Range Status   Group A Strep by PCR NOT DETECTED NOT DETECTED Final    Comment: Performed at [facility], [address] [place], [place] [address]  Resp panel by RT-PCR (RSV, Flu A&B, Covid) Anterior Nasal Swab     Status: None   Collection Time: 12/24/23  1:39 PM   Specimen: Anterior Nasal Swab  Result Value Ref Range Status   SARS Coronavirus 2 by RT PCR NEGATIVE NEGATIVE Final    Comment: (NOTE) SARS-CoV-2 target nucleic acids are NOT DETECTED.  The SARS-CoV-2 RNA is generally detectable in upper respiratory specimens during the acute phase of infection. The lowest concentration of SARS-CoV-2 viral copies this assay can detect is 138 copies/mL. A negative result does not preclude SARS-Cov-2 infection and should not be used as the sole basis for treatment or other patient management decisions. A negative result may occur with  improper specimen collection/handling, submission of specimen other than nasopharyngeal swab, presence of viral mutation(s) within the areas targeted by this assay, and inadequate number of viral copies(<138 copies/mL). A negative result must be combined with clinical observations, patient history, and epidemiological information. The expected result is Negative.  Fact Sheet for Patients:  BloggerCourse.com  Fact Sheet  for Healthcare Providers:  SeriousBroker.it  This test is no t yet approved or cleared by the [place]  FDA and  has been authorized for detection and/or diagnosis of SARS-CoV-2 by FDA under an Emergency Use Authorization (EUA). This EUA will remain  in effect (meaning this test can be used) for the duration of the COVID-19 declaration under Section 564(b)(1) of the Act, 21 U.S.C.section 360bbb-3(b)(1), unless the authorization is terminated  or revoked sooner.       Influenza A by PCR NEGATIVE NEGATIVE Final   Influenza B by PCR NEGATIVE NEGATIVE Final    Comment: (NOTE) The Xpert Xpress SARS-CoV-2/FLU/RSV plus assay is intended as an aid in the diagnosis of influenza from Nasopharyngeal swab specimens and should not be used as a sole basis for treatment. Nasal washings and aspirates are unacceptable for Xpert Xpress SARS-CoV-2/FLU/RSV testing.  Fact Sheet for Patients: BloggerCourse.com  Fact Sheet for Healthcare Providers: SeriousBroker.it  This test is not yet approved or cleared by the [place]  FDA and has been authorized for detection and/or diagnosis of SARS-CoV-2 by FDA under an Emergency Use Authorization (EUA). This EUA will remain in effect (meaning this test can be used) for the duration of the COVID-19 declaration under Section 564(b)(1) of the Act, 21 U.S.C. section 360bbb-3(b)(1), unless the authorization is terminated or revoked.     Resp Syncytial Virus by PCR NEGATIVE NEGATIVE Final    Comment: (NOTE) Fact Sheet for Patients: BloggerCourse.com  Fact Sheet for Healthcare Providers: SeriousBroker.it  This test is not yet approved or cleared by the [place]  FDA and has been authorized for detection and/or diagnosis of SARS-CoV-2 by FDA under an Emergency Use Authorization (EUA). This EUA will remain in effect (meaning  this test can be used) for the duration of the COVID-19 declaration under Section 564(b)(1) of the Act, 21 U.S.C. section 360bbb-3(b)(1), unless the authorization is terminated or revoked.  Performed at [facility], [address] [place], [place] [address]   Blood Culture (routine x 2)     Status: None   Collection Time: 12/24/23  1:41 PM   Specimen: BLOOD  Result Value Ref Range Status   Specimen Description BLOOD BLOOD LEFT FOREARM  Final   Special Requests   Final    BOTTLES DRAWN AEROBIC AND ANAEROBIC Blood Culture adequate volume   Culture   Final    NO GROWTH 5 DAYS Performed at [facility], [address], [place], [place] [address]    Report Status 12/29/2023 FINAL  Final  Blood Culture (routine x 2)     Status:  None   Collection Time: 12/24/23  1:41 PM   Specimen: BLOOD LEFT HAND  Result Value Ref Range Status   Specimen Description BLOOD LEFT HAND  Final   Special Requests   Final    BOTTLES DRAWN AEROBIC AND ANAEROBIC Blood Culture results may not be optimal due to an inadequate volume of blood received in culture bottles   Culture   Final    NO GROWTH 5 DAYS Performed at [facility], [address] [place], [place] [address]    Report Status 12/29/2023 FINAL  Final  MRSA Next Gen by PCR, Nasal     Status: Abnormal   Collection Time: 12/24/23  5:49 PM   Specimen: Nasal Mucosa; Nasal Swab  Result Value Ref Range Status   MRSA by PCR Next Gen DETECTED (A) NOT DETECTED Final    Comment: RESULT CALLED TO, READ BACK BY AND VERIFIED WITH: AMY MCCRAY RN 12/24/23 1937 KG (NOTE) The GeneXpert MRSA Assay (FDA approved for NASAL specimens only), is one component of a comprehensive MRSA colonization surveillance program. It is not intended to diagnose MRSA infection nor to guide or monitor treatment for MRSA infections. Test performance is not FDA approved in patients less than 71 years old. Performed at [facility], [address] [place], [place] [address]   Respiratory (~20 pathogens) panel by PCR     Status: None   Collection Time: 12/25/23  7:20 PM   Specimen: Nasopharyngeal Swab; Respiratory  Result Value Ref Range Status   Adenovirus NOT DETECTED NOT DETECTED Final   Coronavirus 229E NOT DETECTED NOT DETECTED Final    Comment: (NOTE) The Coronavirus on the Respiratory Panel, DOES NOT test for the novel  Coronavirus (2019 nCoV)    Coronavirus HKU1 NOT DETECTED NOT DETECTED Final   Coronavirus NL63 NOT DETECTED NOT DETECTED Final   Coronavirus OC43 NOT DETECTED NOT DETECTED Final   Metapneumovirus NOT DETECTED NOT DETECTED Final   Rhinovirus / Enterovirus NOT DETECTED NOT DETECTED Final   Influenza A NOT DETECTED NOT DETECTED Final   Influenza B NOT DETECTED NOT DETECTED Final   Parainfluenza Virus 1 NOT DETECTED NOT DETECTED Final   Parainfluenza Virus 2 NOT DETECTED NOT DETECTED Final   Parainfluenza Virus 3 NOT DETECTED NOT DETECTED Final   Parainfluenza Virus 4 NOT DETECTED NOT DETECTED Final   Respiratory Syncytial Virus NOT DETECTED NOT DETECTED Final   Bordetella pertussis NOT DETECTED NOT DETECTED Final   Bordetella Parapertussis NOT DETECTED NOT DETECTED Final   Chlamydophila pneumoniae NOT DETECTED NOT DETECTED Final   Mycoplasma pneumoniae NOT DETECTED NOT DETECTED Final    Comment: Performed at [facility], [address], [place], [place] [address]  Culture, blood (Routine X 2) w Reflex to ID Panel     Status: None (Preliminary result)   Collection Time: 12/27/23 10:21 PM   Specimen: BLOOD  Result Value Ref Range Status   Specimen Description BLOOD BLOOD LEFT HAND  Final   Special Requests   Final    BOTTLES DRAWN AEROBIC ONLY Blood Culture results may not be optimal due to an inadequate volume of blood received in culture bottles   Culture   Final    NO GROWTH 4 DAYS Performed at [facility], [address] [place], [place] [address]    Report Status  PENDING  Incomplete  Culture, blood (Routine X 2) w Reflex to ID Panel     Status: None (Preliminary result)   Collection Time:  12/27/23 10:25 PM   Specimen: BLOOD  Result Value Ref Range Status   Specimen Description BLOOD RIGHT  Final   Special Requests   Final    BOTTLES DRAWN AEROBIC AND ANAEROBIC Blood Culture results may not be optimal due to an inadequate volume of blood received in culture bottles   Culture   Final    NO GROWTH 4 DAYS Performed at [facility], [address] [place], [place] [address]    Report Status PENDING  Incomplete    Coagulation Studies: No results for input(s): LABPROT, INR in the last 72 hours.   Urinalysis: No results for input(s): COLORURINE, LABSPEC, PHURINE, GLUCOSEU, HGBUR, BILIRUBINUR, KETONESUR, PROTEINUR, UROBILINOGEN, NITRITE, LEUKOCYTESUR in the last 72 hours.  Invalid input(s): APPERANCEUR     Imaging: No results found.     Medications:       amLODipine   10 mg Oral Daily   atorvastatin   20 mg Oral Daily   calcium  acetate  1,334 mg Oral TID WC   Chlorhexidine  Gluconate Cloth  6 each Topical Daily   feeding supplement (NEPRO CARB STEADY)  237 mL Oral BID BM   fluticasone   2 spray Each Nare Daily   folic acid   1 mg Oral Daily   heparin   5,000 Units Subcutaneous Q8H   insulin  aspart  0-5 Units Subcutaneous QHS   insulin  aspart  0-9 Units Subcutaneous TID WC   leptospermum manuka honey  1 Application Topical Daily   levETIRAcetam   500 mg Oral BID   levothyroxine   187.5 mcg Oral Q Sun   lipase/protease/amylase  12,000 Units Oral TID WC   loratadine   10 mg Oral Daily   melatonin  5 mg Oral QHS   midodrine   2.5 mg Oral Q M,W,F-HD   multivitamin  1 tablet Oral QHS   mupirocin  ointment  1 Application Nasal BID   nystatin   5 mL Oral QID   pantoprazole   40 mg Oral Daily   predniSONE   60 mg Oral Q breakfast   sodium chloride  flush  10-40 mL Intracatheter Q12H    sulfamethoxazole -trimethoprim   1 tablet Oral Once per day on Monday Wednesday Friday   valACYclovir   500 mg Oral QHS   zinc  sulfate (50mg  elemental zinc )  220 mg Oral Daily   acetaminophen , albuterol , alum & mag hydroxide-simeth, calcium  carbonate, hydrALAZINE , hydrOXYzine , ondansetron  **OR** ondansetron  (ZOFRAN ) IV, mouth rinse, polyethylene glycol, sodium chloride  flush  Assessment/ Plan:  Ms. Dakiya Puopolo is a 58 y.o.  female past medical history of end-stage renal disease on hemodialysis Monday Wednesday Friday schedule, ANCA related glomerulonephritis, diet-controlled diabetes, orthostatic hypotension and thrombocytopenia and CLL now admitted with complaints on generalized weakness and inability to care for herself. She is now being treated with antibiotics for pneumonia.   [facility] [place]/MWF/Rt AVF   Generalized weakness in the setting of ESRD on hemodialysis: Next treatment scheduled for Monday.    ANEMIA with chronic kidney disease: Avoiding ESA due to CLL. Hgb 8.8. Patient has received blood transfusion during this admission.   Secondary Hyperparathyroidism: with outpatient labs: PTH 568, phosphorus 5.1, calcium  9.0 on 11/28/23. Currently prescribed calcitriol and calcium  acetate outpatient. Binders  with meals.   4. Hypertension with chronic kidney disease. Diuretics held. Receiving Midodrine  only. Blood pressure 145/72, stable   5. 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 Most recent hemoglobin A1c is 5.6 on 07/29/23.   Diet controlled  6. Fever, unknown origin. Blood cultures negative. Sepsis ruled out, no source  of infection. Will transfer to [facility] today for further workup.       LOS: 6 Tonya Myers 9/6/20259:43 AM

## 2023-12-31 NOTE — Progress Notes (Signed)
 Telephone report given to Chels                                                                                                                                                                                                                                                                                                                                                                                                                                         Phone report given to [facility] at [phone] patient assigned to  7 General Medicine room [phone]

## 2023-12-31 NOTE — Progress Notes (Signed)
 Writer contacted [facility] at [phone] option #5, contact person states due to increased number of calls and staffing patient may  not get picked up until in the morning.

## 2023-12-31 NOTE — Progress Notes (Signed)
 Bed ready [facility] 7 Gen Med Bed 7111. [facility] called to transport.

## 2023-12-31 NOTE — Plan of Care (Signed)
  Problem: Activity: Goal: Ability to tolerate increased activity will improve Outcome: Adequate for Discharge   Problem: Clinical Measurements: Goal: Ability to maintain a body temperature in the normal range will improve Outcome: Adequate for Discharge   Problem: Respiratory: Goal: Ability to maintain adequate ventilation will improve Outcome: Adequate for Discharge Goal: Ability to maintain a clear airway will improve Outcome: Adequate for Discharge   Problem: Education: Goal: Knowledge of General Education information will improve Description: Including pain rating scale, medication(s)/side effects and non-pharmacologic comfort measures Outcome: Adequate for Discharge   Problem: Health Behavior/Discharge Planning: Goal: Ability to manage health-related needs will improve Outcome: Adequate for Discharge   Problem: Clinical Measurements: Goal: Ability to maintain clinical measurements within normal limits will improve Outcome: Adequate for Discharge Goal: Will remain free from infection Outcome: Adequate for Discharge Goal: Diagnostic test results will improve Outcome: Adequate for Discharge Goal: Respiratory complications will improve Outcome: Adequate for Discharge Goal: Cardiovascular complication will be avoided Outcome: Adequate for Discharge   Problem: Activity: Goal: Risk for activity intolerance will decrease Outcome: Adequate for Discharge   Problem: Nutrition: Goal: Adequate nutrition will be maintained Outcome: Adequate for Discharge   Problem: Coping: Goal: Level of anxiety will decrease Outcome: Adequate for Discharge   Problem: Elimination: Goal: Will not experience complications related to bowel motility Outcome: Adequate for Discharge Goal: Will not experience complications related to urinary retention Outcome: Adequate for Discharge   Problem: Pain Managment: Goal: General experience of comfort will improve and/or be controlled Outcome:  Adequate for Discharge   Problem: Safety: Goal: Ability to remain free from injury will improve Outcome: Adequate for Discharge   Problem: Skin Integrity: Goal: Risk for impaired skin integrity will decrease Outcome: Adequate for Discharge   Problem: Education: Goal: Ability to describe self-care measures that may prevent or decrease complications (Diabetes Survival Skills Education) will improve Outcome: Adequate for Discharge Goal: Individualized Educational Video(s) Outcome: Adequate for Discharge   Problem: Coping: Goal: Ability to adjust to condition or change in health will improve Outcome: Adequate for Discharge   Problem: Fluid Volume: Goal: Ability to maintain a balanced intake and output will improve Outcome: Adequate for Discharge   Problem: Health Behavior/Discharge Planning: Goal: Ability to identify and utilize available resources and services will improve Outcome: Adequate for Discharge Goal: Ability to manage health-related needs will improve Outcome: Adequate for Discharge   Problem: Metabolic: Goal: Ability to maintain appropriate glucose levels will improve Outcome: Adequate for Discharge   Problem: Nutritional: Goal: Maintenance of adequate nutrition will improve Outcome: Adequate for Discharge Goal: Progress toward achieving an optimal weight will improve Outcome: Adequate for Discharge   Problem: Skin Integrity: Goal: Risk for impaired skin integrity will decrease Outcome: Adequate for Discharge   Problem: Tissue Perfusion: Goal: Adequacy of tissue perfusion will improve Outcome: Adequate for Discharge

## 2023-12-31 NOTE — Plan of Care (Signed)
  Problem: Activity: Goal: Ability to tolerate increased activity will improve Outcome: Progressing   Problem: Respiratory: Goal: Ability to maintain adequate ventilation will improve Outcome: Progressing   Problem: Health Behavior/Discharge Planning: Goal: Ability to manage health-related needs will improve Outcome: Progressing   Problem: Clinical Measurements: Goal: Ability to maintain clinical measurements within normal limits will improve Outcome: Progressing Goal: Will remain free from infection Outcome: Progressing Goal: Diagnostic test results will improve Outcome: Progressing Goal: Cardiovascular complication will be avoided Outcome: Progressing   Problem: Activity: Goal: Risk for activity intolerance will decrease Outcome: Progressing   Problem: Pain Managment: Goal: General experience of comfort will improve and/or be controlled Outcome: Progressing   Problem: Coping: Goal: Ability to adjust to condition or change in health will improve Outcome: Progressing   Problem: Metabolic: Goal: Ability to maintain appropriate glucose levels will improve Outcome: Progressing   Problem: Skin Integrity: Goal: Risk for impaired skin integrity will decrease Outcome: Progressing

## 2023-12-31 NOTE — Discharge Summary (Addendum)
 Physician Discharge Summary   Patient: Tonya Myers MRN: [id number] DOB: 08/16/1965  Admit date:     12/16/2023  Discharge date: 12/31/23 pt is being transferred to [facility] for further eval  Discharge Physician: Leita Blanch   PCP: Valora Agent, MD   Recommendations at discharge:  Follow up as recommended Discharge Diagnoses: Principal Problem:   AMS (altered mental status) Active Problems:   Acute metabolic encephalopathy   Sepsis (HCC)   Hypothyroidism   Type 2 diabetes mellitus with peripheral neuropathy (HCC)   Dyslipidemia   End-stage renal disease on hemodialysis (HCC)   Thrombocytopenia (HCC)   Chronic kidney disease on chronic dialysis (HCC)   Systemic lupus erythematosus (HCC)   ANCA-positive vasculitis (HCC)   HLH (hemophagocytic lymphohistiocytosis) (HCC)   Herpes simplex infection of perianal skin   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 female with medical history significant for ESRD on hemodialysis, CLL in remission, thrombocytopenia, history of strep pneumonia meningitis, s/p multilevel laminectomy, HLD, HTN, DM, depression, anxiety, hypothyroidism, recent treatment of pneumonia and discharged home on 12/13/2023 and came to emergency room on 12/15/2023, who was found to have acute change in mental status today and hospitalist service was consulted for admission.  On 12/15/2023 patient came back from home concerning for generalized pain and weakness not able to function at home.  That time patient was seen by hospitalist in the ED and advised that patient does not need admission as there is no acute issues.  Patient's vital signs at the time was temperature 98.1, respiration 16 heart rate 86, blood pressure 113/90, SpO2 98% on room air. During her ER stay, patient had a low-grade temperature, hypotension and acute change in mental status.  Nephrology seen the patient and advised for starting 250  cc bolus of normal saline, small dose of midodrine  for hypotension, chest x-ray reported by radiologist for possible infiltrate.  Etiology of low-grade fevers uncertain.  Hospitalist service was consulted for evaluation for admission for acute change in mental status and possible concern for sepsis.   Acute change in mental status  --etiology unclear at this time. There is a possibility of Lupus cerebritis. pt  has had fluctuating altered mental status for the past 3 years on and off  She has had multiple lumbar punctures,  In February 2022 she had pneumococcal meningitis and after that she has not had any evidence of pleocytosis in 4 different lumbar puncture  done after that.  She has had some discordant increase in protein.  She had been followed by neurology in the past.  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 --On 12/11/2023 ANA was positive again  and double-stranded DNA was elevated at >300 which is extremely high compared to 11 in 2022.  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 Neurology recommends transfer to tertiary care center --MRI brian--nothing acute --EEG--no epileptiform acitivity noted. Gen slwoing --Consulted Rheumatology with Dr CHRISTELLA Blanch-- patient currently on high doses of IV steroid 1000 mg x 3 days --now on po steroid 60 mg every day.  --mentation improving slowly --pt really needs to be follwoed by Rheumatology as out pt   Lupus --After discussing with Rheum Dr. Blanch and ID Dr. 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 He recommends continue  prednisone  above dosing at present. No urgent indication for bone marrow biopsy.    Sepsis ruled out--no source of infection noted. Recurrent fevers --no source of infection currently.  Recurrent fevers now thought to be due to lupus. -- Per ID currently on empiric doxycycline  and Meropenem  --ok with Dr Marcelino to get midline --9/5-- IV meropenem  and doxycyline d/ced per ID --HSV DNA 2 PCR positive--Valtrex  every day --long term --PCP proph with SS Bactrim  per ID   PNA, ruled out - Chest x-ray showed New left basilar patchy opacities, however, no symptoms of PNA.     Abdominal pain Chronic pancreatitis --Lipase appears chronically elevated around 80's --KUB no acute finding --cont Creon    ESRD on hemodialysis Due to P ANCA related vasculitis/Lupus Anemia of chronic disease --iHD per nephro --s/p 2 unit BT on 12/29/2023--repeat hgb 8.8   HTN, not currently active  hypotension --cont midodrine  5 mg TID on HD days if needed. BP rising--started amlodipine    DM --last A1c 5.4.   HLD - Continue on Lipitor    Hypothyroidism - Continue on levothyroxine    GERD - Continue Protonix    Pancytopenia    Hx of ITP Chronic thrombocytopenia --last saw Onc Dr. Jacobo on 11/25/23. ----all 3 cell lines have decreased since a month ago.  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 Jacobo on 11/25/23. --BM in April 2025-- appears stable  Hyperglycemia due to steroids --SSI --A1c in the past 5.6%  Patient has a long standing complex h/o Lupus and will better serve at [facility]. Case was d/w Dr Almarie Levels and pt is accepted.  She will need close f/u with Rheumatology as out pt along with other subspecialty. Pt's dter Tonya Myers is aware of the plan.    Pain control - [place]  Controlled Substance Reporting System database was reviewed. and patient was instructed, not to drive, operate heavy machinery, perform activities at heights,  swimming or participation in water activities or provide baby-sitting services while on Pain, Sleep and Anxiety Medications; until their outpatient Physician has advised to do so again. Also recommended to not to take more than prescribed Pain, Sleep and Anxiety Medications.  Consultants: infectious disease; nephrology, Neurology Procedures performed: na  Disposition: [facility] Diet recommendation:  Renal DISCHARGE MEDICAT  Discharge Exam: Filed Weights   12/26/23 1325 12/30/23 0730 12/30/23 1050  Weight: 88.3 kg 90.1 kg 90.1 kg     Condition at discharge: stable  The results of significant diagnostics from this hospitalization (including imaging, microbiology, ancillary and laboratory) are listed below for reference.   Imaging Studies: EEG adult Result Date: 12/28/2023 [name], MD     12/28/2023  1:09 PM History: 58 year old female with a history of seizures presenting with confusion, rule out seizures EEG Duration: 22 minutes Sedation: None Patient State: Awake and drowsy Technique: This EEG was acquired with electrodes placed according to the International 10-20 electrode system (including Fp1, Fp2, F3, F4, C3, C4, P3, P4, O1, O2, T3, T4, T5, T6, A1, A2, Fz, Cz, Pz). The following electrodes were missing or displaced: none. Background: There is a posterior dominant rhythm that achieved a maximal frequency of 8 Hz, but the EEG is predominated by generalized irregular delta and theta  range activities.  There are no rhythmic discharges, epileptiform discharges, or other features of epileptic concern. Photic stimulation: Physiologic driving is not performed EEG Abnormalities: 1) generalized irregular slow activity Clinical Interpretation: This EEG is consistent with a generalized non-specific cerebral dysfunction(encephalopathy). There was no seizure or seizure predisposition recorded on this study. Please note that lack of epileptiform activity on EEG does not preclude the possibility of  epilepsy. Aisha Seals, MD [facility] If 7pm- 7am, please page neurology on call as listed in AMION.  MR BRAIN WO CONTRAST Result Date: 12/28/2023 EXAM: MRI BRAIN WITHOUT CONTRAST 12/28/2023 06:38:00 AM TECHNIQUE: Multiplanar multisequence MRI of the head/brain was performed without the administration of intravenous contrast. COMPARISON: MRI of the head dated 07/25/2023. CLINICAL HISTORY: 58 y.o. female with hx of autoimmune disease, including lupus, presents with altered mental status and recent pneumonia. FINDINGS: BRAIN AND VENTRICLES: No acute infarct. No intracranial hemorrhage. No mass. No midline shift. No hydrocephalus. The sella is unremarkable. Normal flow voids. Moderate periventricular and deep cerebral white matter disease present. ORBITS: No acute abnormality. SINUSES AND MASTOIDS: Extensive opacification of the paranasal sinuses with polypoid mucosal thickening present within the maxillary sinuses bilaterally. BONES AND SOFT TISSUES: Normal marrow signal. No acute soft tissue abnormality. IMPRESSION: 1. No acute intracranial abnormality. 2. Moderate periventricular and deep cerebral white matter disease. 3. Extensive opacification of the paranasal sinuses with polypoid mucosal thickening within the maxillary sinuses bilaterally. Electronically signed by: [name] MD 12/28/2023 06:50 AM EDT RP Workstation: [id number]   DG Abd 1 View Result Date: 12/25/2023 CLINICAL DATA:  Abdominal pain. EXAM: ABDOMEN - 1 VIEW COMPARISON:  None Available. FINDINGS: Normal bowel gas pattern. There are calcifications in the left upper quadrant, corresponding of the pancreas. Lung bases are clear. IMPRESSION: 1. No acute findings. 2. Chronic calcific pancreatitis. Electronically Signed   By: Newell Eke M.D.   On: 12/25/2023 14:18   CT Head Wo Contrast Result Date: 12/24/2023 CLINICAL DATA:  Delirium EXAM: CT HEAD WITHOUT CONTRAST TECHNIQUE: Contiguous axial images were obtained  from the base of the skull through the vertex without intravenous contrast. RADIATION DOSE REDUCTION: This exam was performed according to the departmental dose-optimization program which includes automated exposure control, adjustment of the mA and/or kV according to patient size and/or use of iterative reconstruction technique. COMPARISON:  CT head 03/28/2023. FINDINGS: Mild to moderately motion limited study.  Within this limitation: Brain: No evidence of acute infarction, hemorrhage, hydrocephalus, extra-axial collection or mass lesion/mass effect. Vascular: No hyperdense vessel. Skull: No acute fracture. Sinuses/Orbits: Severe paranasal sinus disease with partial opacification of all sinuses. Other: No mastoid effusions. IMPRESSION: 1. No evidence of acute intracranial abnormality. 2. Severe paranasal sinus disease. Electronically Signed   By: Gilmore GORMAN Molt M.D.   On: 12/24/2023 15:16   DG Chest Portable 1 View Result Date: 12/24/2023 EXAM: 1 VIEW XRAY OF THE CHEST 12/24/2023 01:04:31 PM COMPARISON: 12/15/2023 CLINICAL HISTORY: Fever, weakness, ? pneumonia. FINDINGS: LUNGS AND PLEURA: Low lung volumes. New left basilar patchy opacities. No pleural effusion. No pneumothorax. HEART AND MEDIASTINUM: No acute abnormality of the cardiac and mediastinal silhouettes. BONES AND SOFT TISSUES: Right upper extremity vascular stent noted. No acute osseous abnormality. IMPRESSION: 1. New left basilar patchy opacities, concerning for pneumonia. 2. Low lung volumes. Electronically signed by: Waddell Calk MD 12/24/2023 01:23 PM EDT RP Workstation: [id number]   DG Chest 2 View Result Date: 12/15/2023 CLINICAL DATA:  Shortness of breath. EXAM: CHEST - 2 VIEW COMPARISON:  12/12/2023 FINDINGS: The lungs are  clear without focal pneumonia, edema, pneumothorax or pleural effusion. Cardiopericardial silhouette is at upper limits of normal for size. No acute bony abnormality. Vascular stent device noted medial right arm.  IMPRESSION: No active cardiopulmonary disease. Electronically Signed   By: Camellia Candle M.D.   On: 12/15/2023 10:32   NM Pulmonary Perfusion Result Date: 12/12/2023 CLINICAL DATA:  Dyspnea, elevated D-dimer EXAM: NUCLEAR MEDICINE PERFUSION LUNG SCAN TECHNIQUE: Perfusion images were obtained in multiple projections after intravenous injection of radiopharmaceutical. Ventilation scans intentionally deferred if perfusion scan and chest x-ray adequate for interpretation. RADIOPHARMACEUTICALS:  4.4 mCi Tc-82m MAA IV COMPARISON:  12/12/2023 chest x-ray FINDINGS: Planar images of the lungs are obtained in multiple projections during the perfusion exam. There are no wedge-shaped perfusion defects. Normal symmetrical radiotracer distribution within the lungs. IMPRESSION: 1. No evidence of pulmonary embolus.  Normal perfusion exam. Electronically Signed   By: Ozell Daring M.D.   On: 12/12/2023 18:44   DG Chest 2 View Result Date: 12/12/2023 CLINICAL DATA:  Pulmonary embolism. EXAM: CHEST - 2 VIEW COMPARISON:  12/10/2023 and CT chest 12/12/2021. FINDINGS: Trachea is midline. Heart is enlarged, stable. There may be minimal bibasilar interstitial prominence, left greater than right. Small bilateral effusions. IMPRESSION: Suspect mild congestive heart failure. Electronically Signed   By: Newell Eke M.D.   On: 12/12/2023 11:00   DG Wrist 2 Views Left Result Date: 12/11/2023 CLINICAL DATA:  Bilateral wrist pain EXAM: LEFT WRIST - 2 VIEW; RIGHT WRIST - 2 VIEW COMPARISON:  12/10/2023 FINDINGS: Left wrist: Frontal and lateral views are obtained. No acute fracture, subluxation, or dislocation. Mild joint space narrowing and osteophyte formation at the first carpometacarpal joint. Remaining joint spaces are well preserved. Moderate subcutaneous edema throughout the distal left forearm, wrist, and base of the hand. No subcutaneous gas. Right wrist: Frontal and lateral views are obtained. No fracture, subluxation, or  dislocation. Joint spaces are well preserved. Soft tissues are unremarkable. IMPRESSION: Left wrist: 1. No acute fracture. 2. Diffuse soft tissue swelling from the left forearm through the base of the hand, which could reflect cellulitis. 3. 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 Right wrist: 1. No acute fracture.  Unremarkable exam. Electronically Signed   By: Ozell Daring M.D.   On: 12/11/2023 11:29   DG Wrist 2 Views Right Result Date: 12/11/2023 CLINICAL DATA:  Bilateral wrist pain EXAM: LEFT WRIST - 2 VIEW; RIGHT WRIST - 2 VIEW COMPARISON:  12/10/2023 FINDINGS: Left wrist: Frontal and lateral views are obtained. No acute fracture, subluxation, or dislocation. Mild joint space narrowing and osteophyte formation at the first carpometacarpal joint. Remaining joint spaces are well preserved. Moderate subcutaneous edema throughout the distal left forearm, wrist, and base of the hand. No subcutaneous gas. Right wrist: Frontal and lateral views are obtained. No fracture, subluxation, or dislocation. Joint spaces are well preserved. Soft tissues are unremarkable. IMPRESSION: Left wrist: 1. No acute fracture. 2. Diffuse soft tissue swelling from the left forearm through the base of the hand, which could reflect cellulitis. 3. 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 Right wrist: 1. No acute fracture.  Unremarkable exam. Electronically Signed   By: Ozell Daring M.D.   On: 12/11/2023 11:29   DG Chest 1 View Result Date: 12/10/2023 CLINICAL DATA:  Cough EXAM: CHEST  1 VIEW COMPARISON:  07/29/2023 FINDINGS: 2 frontal views of the chest demonstrate an unremarkable cardiac silhouette. There is patchy airspace disease within the left lower lobe consistent with bronchopneumonia. No effusion or pneumothorax. No acute  bony abnormalities. IMPRESSION: 1. Patchy left lower lobe bronchopneumonia. Electronically Signed   By: Ozell Daring M.D.   On: 12/10/2023 15:21   DG Forearm  Left Result Date: 12/10/2023 CLINICAL DATA:  Forearm and wrist pain EXAM: LEFT FOREARM - 2 VIEW COMPARISON:  None Available. FINDINGS: There is no evidence of fracture or other focal bone lesions. Soft tissues are unremarkable. IMPRESSION: Negative. Electronically Signed   By: Michaeline Blanch M.D.   On: 12/10/2023 13:53    Microbiology: Results for orders placed or performed during the hospital encounter of 12/16/23  Resp panel by RT-PCR (RSV, Flu A&B, Covid) Anterior Nasal Swab     Status: None   Collection Time: 12/19/23  4:21 PM   Specimen: Anterior Nasal Swab  Result Value Ref Range Status   SARS Coronavirus 2 by RT PCR NEGATIVE NEGATIVE Final    Comment: (NOTE) SARS-CoV-2 target nucleic acids are NOT DETECTED.  The SARS-CoV-2 RNA is generally detectable in upper respiratory specimens during the acute phase of infection. The lowest concentration of SARS-CoV-2 viral copies this assay can detect is 138 copies/mL. A negative result does not preclude SARS-Cov-2 infection and should not be used as the sole basis for treatment or other patient management decisions. A negative result may occur with  improper specimen collection/handling, submission of specimen other than nasopharyngeal swab, presence of viral mutation(s) within the areas targeted by this assay, and inadequate number of viral copies(<138 copies/mL). A negative result must be combined with clinical observations, patient history, and epidemiological information. The expected result is Negative.  Fact Sheet for Patients:  BloggerCourse.com  Fact Sheet for Healthcare Providers:  SeriousBroker.it  This test is no t yet approved or cleared by the [place]  FDA and  has been authorized for detection and/or diagnosis of SARS-CoV-2 by FDA under an Emergency Use Authorization (EUA). This EUA will remain  in effect (meaning this test can be used) for the duration of the COVID-19  declaration under Section 564(b)(1) of the Act, 21 U.S.C.section 360bbb-3(b)(1), unless the authorization is terminated  or revoked sooner.       Influenza A by PCR NEGATIVE NEGATIVE Final   Influenza B by PCR NEGATIVE NEGATIVE Final    Comment: (NOTE) The Xpert Xpress SARS-CoV-2/FLU/RSV plus assay is intended as an aid in the diagnosis of influenza from Nasopharyngeal swab specimens and should not be used as a sole basis for treatment. Nasal washings and aspirates are unacceptable for Xpert Xpress SARS-CoV-2/FLU/RSV testing.  Fact Sheet for Patients: BloggerCourse.com  Fact Sheet for Healthcare Providers: SeriousBroker.it  This test is not yet approved or cleared by the [place]  FDA and has been authorized for detection and/or diagnosis of SARS-CoV-2 by FDA under an Emergency Use Authorization (EUA). This EUA will remain in effect (meaning this test can be used) for the duration of the COVID-19 declaration under Section 564(b)(1) of the Act, 21 U.S.C. section 360bbb-3(b)(1), unless the authorization is terminated or revoked.     Resp Syncytial Virus by PCR NEGATIVE NEGATIVE Final    Comment: (NOTE) Fact Sheet for Patients: BloggerCourse.com  Fact Sheet for Healthcare Providers: SeriousBroker.it  This test is not yet approved or cleared by the [place]  FDA and has been authorized for detection and/or diagnosis of SARS-CoV-2 by FDA under an Emergency Use Authorization (EUA). This EUA will remain in effect (meaning this test can be used) for the duration of the COVID-19 declaration under Section 564(b)(1) of the Act, 21 U.S.C. section 360bbb-3(b)(1), unless the authorization is terminated  or revoked.  Performed at [facility], [address] [place], [place] [address]   Group A Strep by PCR [facility] Only)     Status: None   Collection Time: 12/19/23   4:21 PM   Specimen: Anterior Nasal Swab; Sterile Swab  Result Value Ref Range Status   Group A Strep by PCR NOT DETECTED NOT DETECTED Final    Comment: Performed at [facility], [address], [place], [place] [address]  Resp panel by RT-PCR (RSV, Flu A&B, Covid) Anterior Nasal Swab     Status: None   Collection Time: 12/24/23  1:39 PM   Specimen: Anterior Nasal Swab  Result Value Ref Range Status   SARS Coronavirus 2 by RT PCR NEGATIVE NEGATIVE Final    Comment: (NOTE) SARS-CoV-2 target nucleic acids are NOT DETECTED.  The SARS-CoV-2 RNA is generally detectable in upper respiratory specimens during the acute phase of infection. The lowest concentration of SARS-CoV-2 viral copies this assay can detect is 138 copies/mL. A negative result does not preclude SARS-Cov-2 infection and should not be used as the sole basis for treatment or other patient management decisions. A negative result may occur with  improper specimen collection/handling, submission of specimen other than nasopharyngeal swab, presence of viral mutation(s) within the areas targeted by this assay, and inadequate number of viral copies(<138 copies/mL). A negative result must be combined with clinical observations, patient history, and epidemiological information. The expected result is Negative.  Fact Sheet for Patients:  BloggerCourse.com  Fact Sheet for Healthcare Providers:  SeriousBroker.it  This test is no t yet approved or cleared by the [place]  FDA and  has been authorized for detection and/or diagnosis of SARS-CoV-2 by FDA under an Emergency Use Authorization (EUA). This EUA will remain  in effect (meaning this test can be used) for the duration of the COVID-19 declaration under Section 564(b)(1) of the Act, 21 U.S.C.section 360bbb-3(b)(1), unless the authorization is terminated  or revoked sooner.       Influenza A by PCR NEGATIVE  NEGATIVE Final   Influenza B by PCR NEGATIVE NEGATIVE Final    Comment: (NOTE) The Xpert Xpress SARS-CoV-2/FLU/RSV plus assay is intended as an aid in the diagnosis of influenza from Nasopharyngeal swab specimens and should not be used as a sole basis for treatment. Nasal washings and aspirates are unacceptable for Xpert Xpress SARS-CoV-2/FLU/RSV testing.  Fact Sheet for Patients: BloggerCourse.com  Fact Sheet for Healthcare Providers: SeriousBroker.it  This test is not yet approved or cleared by the [place]  FDA and has been authorized for detection and/or diagnosis of SARS-CoV-2 by FDA under an Emergency Use Authorization (EUA). This EUA will remain in effect (meaning this test can be used) for the duration of the COVID-19 declaration under Section 564(b)(1) of the Act, 21 U.S.C. section 360bbb-3(b)(1), unless the authorization is terminated or revoked.     Resp Syncytial Virus by PCR NEGATIVE NEGATIVE Final    Comment: (NOTE) Fact Sheet for Patients: BloggerCourse.com  Fact Sheet for Healthcare Providers: SeriousBroker.it  This test is not yet approved or cleared by the [place]  FDA and has been authorized for detection and/or diagnosis of SARS-CoV-2 by FDA under an Emergency Use Authorization (EUA). This EUA will remain in effect (meaning this test can be used) for the duration of the COVID-19 declaration under Section 564(b)(1) of the Act, 21 U.S.C. section 360bbb-3(b)(1), unless the authorization is terminated or revoked.  Performed at [facility], [address] [place], [place] [address]  Blood Culture (routine x 2)     Status: None   Collection Time: 12/24/23  1:41 PM   Specimen: BLOOD  Result Value Ref Range Status   Specimen Description BLOOD BLOOD LEFT FOREARM  Final   Special Requests   Final    BOTTLES DRAWN AEROBIC AND ANAEROBIC Blood  Culture adequate volume   Culture   Final    NO GROWTH 5 DAYS Performed at [facility], [address] [place], [place] [address]    Report Status 12/29/2023 FINAL  Final  Blood Culture (routine x 2)     Status: None   Collection Time: 12/24/23  1:41 PM   Specimen: BLOOD LEFT HAND  Result Value Ref Range Status   Specimen Description BLOOD LEFT HAND  Final   Special Requests   Final    BOTTLES DRAWN AEROBIC AND ANAEROBIC Blood Culture results may not be optimal due to an inadequate volume of blood received in culture bottles   Culture   Final    NO GROWTH 5 DAYS Performed at [facility], [address], [place], [place] [address]    Report Status 12/29/2023 FINAL  Final  MRSA Next Gen by PCR, Nasal     Status: Abnormal   Collection Time: 12/24/23  5:49 PM   Specimen: Nasal Mucosa; Nasal Swab  Result Value Ref Range Status   MRSA by PCR Next Gen DETECTED (A) NOT DETECTED Final    Comment: RESULT CALLED TO, READ BACK BY AND VERIFIED WITH: AMY MCCRAY RN 12/24/23 1937 KG (NOTE) The GeneXpert MRSA Assay (FDA approved for NASAL specimens only), is one component of a comprehensive MRSA colonization surveillance program. It is not intended to diagnose MRSA infection nor to guide or monitor treatment for MRSA infections. Test performance is not FDA approved in patients less than 49 years old. Performed at [facility], [address] [place], [place] [address]   Respiratory (~20 pathogens) panel by PCR     Status: None   Collection Time: 12/25/23  7:20 PM   Specimen: Nasopharyngeal Swab; Respiratory  Result Value Ref Range Status   Adenovirus NOT DETECTED NOT DETECTED Final   Coronavirus 229E NOT DETECTED NOT DETECTED Final    Comment: (NOTE) The Coronavirus on the Respiratory Panel, DOES NOT test for the novel  Coronavirus (2019 nCoV)    Coronavirus HKU1 NOT DETECTED NOT DETECTED Final   Coronavirus NL63 NOT DETECTED NOT DETECTED Final    Coronavirus OC43 NOT DETECTED NOT DETECTED Final   Metapneumovirus NOT DETECTED NOT DETECTED Final   Rhinovirus / Enterovirus NOT DETECTED NOT DETECTED Final   Influenza A NOT DETECTED NOT DETECTED Final   Influenza B NOT DETECTED NOT DETECTED Final   Parainfluenza Virus 1 NOT DETECTED NOT DETECTED Final   Parainfluenza Virus 2 NOT DETECTED NOT DETECTED Final   Parainfluenza Virus 3 NOT DETECTED NOT DETECTED Final   Parainfluenza Virus 4 NOT DETECTED NOT DETECTED Final   Respiratory Syncytial Virus NOT DETECTED NOT DETECTED Final   Bordetella pertussis NOT DETECTED NOT DETECTED Final   Bordetella Parapertussis NOT DETECTED NOT DETECTED Final   Chlamydophila pneumoniae NOT DETECTED NOT DETECTED Final   Mycoplasma pneumoniae NOT DETECTED NOT DETECTED Final    Comment: Performed at [facility], [address], [place], [place] [address]  Culture, blood (Routine X 2) w Reflex to ID Panel     Status: None (Preliminary result)   Collection Time: 12/27/23 10:21 PM   Specimen: BLOOD  Result Value Ref Range Status   Specimen Description BLOOD BLOOD LEFT HAND  Final   Special Requests   Final    BOTTLES DRAWN AEROBIC ONLY Blood Culture results may not be optimal due to an inadequate volume of blood received in culture bottles   Culture   Final    NO GROWTH 4 DAYS Performed at [facility], [address] [place], [place] [address]    Report Status PENDING  Incomplete  Culture, blood (Routine X 2) w Reflex to ID Panel     Status: None (Preliminary result)   Collection Time: 12/27/23 10:25 PM   Specimen: BLOOD  Result Value Ref Range Status   Specimen Description BLOOD RIGHT  Final   Special Requests   Final    BOTTLES DRAWN AEROBIC AND ANAEROBIC Blood Culture results may not be optimal due to an inadequate volume of blood received in culture bottles   Culture   Final    NO GROWTH 4 DAYS Performed at [facility], [address] [place], [place] [address]     Report Status PENDING  Incomplete    Labs: CBC: Recent Labs  Lab 12/26/23 0544 12/27/23 2221 12/29/23 0331 12/29/23 0859 12/30/23 0740  WBC 3.9* 2.9* 4.1 4.2 4.5  NEUTROABS  --  2.4  --   --   --   HGB 8.3* 6.7* 7.3* 9.0* 8.0*  HCT 24.7* 19.9* 21.9* 28.9* 24.0*  MCV 95.4 97.5 91.6 90.3 87.3  PLT 56* 54* 62* 49* 50*   Basic Metabolic Panel: Recent Labs  Lab 12/25/23 0454 12/26/23 0544 12/27/23 2225 12/30/23 0740  NA 141 139 136 130*  K 4.5 4.6 4.1 4.5  CL 97* 95* 94* 90*  CO2 26 24 26 23   GLUCOSE 124* 153* 197* 636*  BUN 55* 71* 46* 63*  CREATININE 7.98* 10.24* 7.91* 6.77*  CALCIUM  8.0* 7.9* 7.3* 7.6*  MG  --  2.3  --   --   PHOS  --   --   --  5.8*   Liver Function Tests: Recent Labs  Lab 12/25/23 0454 12/27/23 2225 12/30/23 0740  AST 105* 88*  --   ALT 23 25  --   ALKPHOS 50 52  --   BILITOT 0.9 0.7  --   PROT 6.3* 5.6*  --   ALBUMIN  2.5* 2.1* 2.4*   CBG: Recent Labs  Lab 12/30/23 1245 12/30/23 1727 12/30/23 2119 12/31/23 0747 12/31/23 1207  GLUCAP 270* 292* 296* 332* 401*    Discharge time spent: less than 30 minutes.  Signed: Leita Blanch, MD [facility] 12/31/2023

## 2024-01-01 LAB — CULTURE, BLOOD (ROUTINE X 2)
Culture: NO GROWTH
Culture: NO GROWTH

## 2024-01-01 LAB — BPAM RBC
Blood Product Expiration Date: 202509242359
Blood Product Expiration Date: 202509262359
Blood Product Expiration Date: 202509262359
ISSUE DATE / TIME: 202509032329
ISSUE DATE / TIME: 202509040417
Unit Type and Rh: 5100
Unit Type and Rh: 5100
Unit Type and Rh: 5100

## 2024-01-01 LAB — TYPE AND SCREEN
ABO/RH(D): O POS
Antibody Screen: POSITIVE
DAT, IgG: POSITIVE
DAT, complement: NEGATIVE
Unit division: 0
Unit division: 0
Unit division: 0

## 2024-01-01 NOTE — Consults (Signed)
 ------------------------------------------------------------------------------- Attestation with edits by Juli Leeroy Palma, MD at 01/02/24 [phone] I saw and evaluated the patient, participating in the key portions of the service.  I reviewed the resident's note.  I agree with the resident's findings and plan.   Briefly, 58 y.o. 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  The patient underwent initial evaluation at the outside hospital (OSH) prior to transfer. As outlined by the fellow, the differential diagnosis for her altered mental status (AMS) remains broad and incompletely evaluated. Ongoing concern includes infection, as well as seizure-related AMS given she was off antiepileptic therapy prior to admission. Possible CNS lupus also remains on the differential; she has completed methylprednisolone  1,000 mg IV daily for 3 days and is now on prednisone  60 mg daily, with reported improvement but persistent waxing and waning symptoms. We will also evaluate for HLH/MAS and appreciate heme/onc assistance. I agree with the fellow's proposed work-up plan  I personally spent 90 minutes face-to-face and non-face-to-face in the care of this patient, which includes all pre, intra, and post visit E/M time on the date of service. All documented time was specific to the E/M visit and does not include any pre, intra, post procedure related time.            [name], MD  -------------------------------------------------------------------------------    Rheumatology Consult Initial Note:  Reason for consult: Tonya Myers is seen in consultation at the request of Dr. Chiquita Sora for evaluation of encephalopathy.   Assessment: Tonya Myers  is a 58 y.o. 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  Patient presented to OSH with altered mental status, weakness, fever, hypotension, for which there is broad DDx including infection, metabolic, endocrine toxicologic, neurologic (seizure, etc), autoimmune, etc.  Initial work-up concerning for infection given CXR demonstrating new left basilar opacities concerning for infection.  Patient also noted to have severe paranasal sinus disease with opacification of all sinuses. .  Patient completed ceftriaxone  2 g x 3d and azithromycin  x 5d with return of fever and worsening mental status  Abx broaded at that time and evaluation for alternate causes of AMS.  Ultimately evaluated by rheumatology at OSH who were concern for SLE vs. MAS/HLH given prior positive serologies, +dsDNA, pancytopenia (WBC 2.9, Hgb 6.7, PLT 54, elevated ferritin >7000, LDH 450, AST 88.  She is s/p solumedrol 1000 mg x 3d and is currently on prednisone  60 mg daily   It would be helpful to further corroborate what her mentation pattern has been since presentation to before and after high dose steroids as her current mental status seems to be highly waxing and waning based on eval by our team and primary team this admission, in addition to report from pt's sister that she tends to exhibit prolonged encephalopathy after infections based on previous patterns and pt was just recently treated for PNA.   At this time we there is not enough objective data to strongly support lupus cerebritis and  its likelihood over other differentials, notably underlying infection,  toxic metabolic or medication-induced derrangements, as well as malignancy. We would be in support of a broad differential and workup to pursue any potential gaps that may exist due to transferring care from OSH.  Given her cytopenias and elevated ferritin, reasonable to consider HLH/MAS but I am not sure seizure activity has been ruled out as outside EEG duration was only 22 minutes and patient is not currently on AEDs despite history of R Hemispheric Focal Onset Seizures with Progression to GTC (November 2022) captured on EEG.  Based on my extensive research of this patient's medical history and care over the last three years, she has had an overwhelming amount of evaluation, procedures and imaging at multiple medical centers for recurrent altered mental status, each time with concern for an underlying autoimmune cause with subsequent discovery of underlying infection or seizure activity. While it is true that she has a history of ANCA vasculitis, a diagnosis of SLE remains uncertain at best.  Recommendations:  - We do recommend obtaining CSF studies (if able since she is not currently able to consent to procedures) to further evaluate infectious vs autoimmune etiology.  - Recommend 24hr vs continuous EEG and neurology consult  in setting of known history of R Hemispheric Focal Onset Seizures with Progression to GTC. There is documentation of onset of multiple seizures requiring intubation when keppra  was stopped in the past, then agent switched to vimpat  due to concern keppra  was causing encephalopathy. Pt is currently off all seizure medications for unknown reasons. - Agree with soluble IL-2 receptor for HLH concern and ADAMTS13 to evaluate for TTP, peripheral smear. Recommend hematology consult for assistance.  - Recommend ua to evaluate for UTI if pt is not anuric, especially in setting of persistent hyperglycemia - Please trend ferritin and inflammatory markers (sed rate/CRP) - Continue PJP ppx and  PPI if continuing high dose steroids   Patient seen and discussed with attending physician, Dr. Juli  Thank you for this interesting consult.  We will continue to follow along.  Mardeen Georges, MD PGY4 Rheumatology Fellow Department of Medicine/Division of Rheumatology [facility] of Medicine  ---------------------------------------------------------------------------------------------------------------------- Issues Impacting Complexity of Management: <redacted file path>  I have independently reviewed the following tests today:  [x]  CBC w/ diff  [x]  Chemistry panel  []  Urinalysis  []  UPC  [x]  PT/INR  [x]  APTT    [x]  Serological testing  [x]  Complement levels  [x]  ESR/CRP  [x]  Blood glucose  []  Other:    []  XR of hands  []  XR of feet  [x]  XR of chest  [x]  CT of Head  [x]  MR of Brain  []  PET/CT        I have discussed the updated plan of care with the following medical teams  [x]  Primary team  []          I have independently reviewed the following outside information  [x]  Outside hospital records  []  Outside PCP records  [x]  Outside subspeciality records  [x]  Prior Rheumatology records   -The patient is at high risk for disease and/or treatment complications due to social barriers to care. This has influenced the treatment plan from the standard of care. ----------------------------------------------------------------------------------------------------------------------  Rheumatologic History:  Tonya Myers is a 58 y.o. 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   Patient presented to [facility]  with diffuse body pain but subsequently became encephalopathic while in the ED and after evaluation by rheumatology service there she was treated for this with pulse dose steroids (1g solumedrol x3 days), after which she is noted to have had 'marked  improvement' per OSH documentation. There is a question of whether patient had fevers and what the fever curve was like as there is conflicting documentation but she did undergo MRI which was notable for Moderate periventricular and deep cerebral white matter disease present possibly consistent with cognitive impairment noted over the last 3 years. 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  Diagnosis made based on serologies and renal biopsy however she has carries a concurrent diagnosis of SLE. Tonya Myers She is +ANA and +ENA, +SSA, but are relatively low titer levels. dsDNA is elevated this time but had been low titer in the past. Low C3/C4 noted but this can also be seen in ANCA vasculitis. Past skin biopsy showed findings more consistent with ecchymosis and DIF with positive IgG in BM which can be seen with SLE but overall it was nonspecific. Our team previously reviewed renal pathology slides from renal biopsy on 12/16/20 which actually favored ANCA vasculitis rather than SLE though noted to be somewhat inconsistent with typical findings of a pauci immune process. .   During a hospitalization at a OSH in August 2022, she was treated with pulse dose solumedrol for 3 days as well as Rituximab  12/12/20 and 12/26/20 but she was deemed non-responsive to immunosuppression as she progressed to ESRD and ended up on HD so no further immunosuppression was recommended during prior evaluations. It is unclear whether she followed with anyone between December of 2022 until now.   Tx hx: - 11/2020: treated with pulse dose solumedrol for 3 days as well as Rituximab  8/19 and 9/2 but she was deemed non-responsive to immunosuppression as she  progressed to ESRD and ended up on HD so no further immunosuppression was recommended  Serologies in 2022: +ANA 1:160 speckled  +ENA, +SSA,  low C3 and C4 +PR3 ANCA  +dsDNA elevated at 11 PR3 low positive  Serologies in August 2025: +ve ANA no titer available dsDNA >300 C3/C4 low SSA >8 ENA pending ANCA pending CRP elevated to 21  Past Medical History[1]  Family History[2]  Short Social History[3]  Outpatient Medications Medications Ordered Prior to Encounter[4]  Current Medications: Scheduled Medications[5] Infusion: Infusions Meds[6] PRN: PRN Medications[7]  Allergies[8]   Review of Systems: unable to obtain due to altered mentation   Objective: Vitals: Temp:  [36.7 C (98.1 F)-36.9 C (98.4 F)] 36.7 C (98.1 F) Pulse:  [95-101] 95 Resp:  [16-17] 17 BP: (156-175)/(87-96) 175/96 MAP (mmHg):  [106-119] 119 SpO2:  [90 %-100 %] 100 %  Exam: Gen: NAD, Alert, oriented to person only on initial eval, then person and year but not month or place on repeat orientation questioning. Eyes: Anicteric sclera, PERRL ENT: No oral lesions. Moist mucuous membranes. Clear oropharynx. Pulmonary: No wheezes or rales appreciated. Wet sounding cough. Crackles and coarse upper airway sounds on auscultation. Cardiac: Regular rate and rhythm.  No murmurs, rubs or gallops appreciated GI:  Soft.  Non-tender. No organomegaly appreciated. Normal active bowel sounds. MSK:  Normal exam with full ROM and no synovitis or tenderness to palpation in hands, wrists, elbows, shoulders, knees, ankles.  Neuro: Gait unable to test, waxing orientation, CN 2-12 grossly intact Derm: No rashes appreciated        [1] Past Medical History: Diagnosis Date  . AKI (acute kidney injury) 01/19/2021  . CLL (chronic lymphocytic leukemia)    (CMS-HCC)   . Diabetes mellitus    (CMS-HCC)   . High blood pressure   [2] Family History Problem Relation Age of Onset  . Melanoma Neg Hx   . Basal cell  carcinoma Neg Hx   . Squamous cell carcinoma Neg Hx   [3] Social History Tobacco Use  . Smoking status: Never  . Smokeless tobacco: Never  Vaping Use  . Vaping status: Never Used  Substance Use Topics  . Alcohol use: Never  . Drug use: Never  [4] Current Facility-Administered Medications on File Prior to Encounter  Medication Dose Route Frequency Provider Last Rate Last Admin  . propofoL  (DIPRIVAN ) injection   Intravenous PRN (once a day) Roark Kelly Jansky, MD   140 mg at 03/19/21 0255   Current Outpatient Medications on File Prior to Encounter  Medication Sig Dispense Refill  . acetaminophen  (TYLENOL ) 500 MG tablet Take 2 tablets (1,000 mg total) by mouth every eight (8) hours as needed for pain.    . albuterol  HFA 90 mcg/actuation inhaler Inhale 2 puffs every six (6) hours as needed for wheezing or shortness of breath.  0  . atorvastatin  (LIPITOR) 20 MG tablet Take 1 tablet (20 mg total) by mouth nightly.    . fluticasone  propionate (FLONASE ) 50 mcg/actuation nasal spray 1 spray into each nostril two (2) times a day as needed.    . folic acid  (FOLVITE ) 1 MG tablet Take 1 tablet (1,000 mcg total) by mouth daily.    . gabapentin  (NEURONTIN ) 100 MG capsule Take 1 capsule (100 mg total) by mouth Three (3) times a day. 90 capsule 0  . levothyroxine  (SYNTHROID ) 125 MCG tablet Take 1 tablet (125 mcg total) by mouth daily. 30 tablet 0  . melatonin 3 mg Tab Take 1 tablet (3 mg total) by mouth nightly as needed.  0  . midodrine  (PROAMATINE ) 5 MG tablet Take 1 tablet (5 mg total) by mouth every Monday, Wednesday, and Friday. Before dialysis 12 tablet 0  . multivitamins, therapeutic with minerals 9 mg iron-400 mcg tablet Take 1 tablet by mouth daily.  0  . pantoprazole  (PROTONIX ) 40 MG tablet Take 1 tablet (40 mg total) by mouth daily.    . sodium zirconium cyclosilicate  (LOKELMA ) 10 gram PwPk packet Take 1 packet (10 g total) by mouth once a week. Take 1 dose every Sunday 12 packet 3  .  thiamine  mononitrate, vit B1, 100 mg Tab tablet Take 1 tablet (100 mg total) by mouth daily.  0  . valACYclovir  (VALTREX ) 500 MG tablet Take 1 tablet (500 mg total) by mouth daily. Prior to dialysis    . white petrolatum (AQUAPHOR) 41 % Oint Apply topically daily.  0  [5] . atorvastatin   20 mg Oral Nightly  . [START ON 01/02/2024] calcitriol  0.25 mcg Oral Mon,Wed,Fri  . folic acid   1,000 mcg Oral Daily  . [Provider Hold] heparin  (porcine) for subcutaneous use  5,000 Units Subcutaneous Q8H SCH  . insulin  lispro  4 Units Subcutaneous 3xd Meals  . insulin  lispro  0-20 Units Subcutaneous ACHS  . insulin  NPH  5 Units Subcutaneous Q12H SCH  .  levothyroxine   125 mcg Oral Daily  . melatonin  3 mg Oral Nightly  . pancrelipase  (Lip-Prot-Amyl)  1 capsule Oral 3xd Meals  . pantoprazole   40 mg Oral daily  . predniSONE   60 mg Oral Daily  . [START ON 01/02/2024] sulfamethoxazole -trimethoprim   1 tablet Oral Once per day on Monday Wednesday Friday  . thiamine   500 mg Intravenous Daily  . valACYclovir   500 mg Oral Q PM  [6] [7] dextrose  in water, glucagon, glucose, heparin  (porcine), hydrOXYzine , oxyCODONE  [8] Allergies Allergen Reactions  . Amoxapine   . Latex   . Penicillins   . Ceftazidime Confusion    Encephalopathy - improved after dialysis/some concern for cephalosporin neurotoxicity Tolerated without confusion 06/2021.  . Iodinated Contrast Media Hives    Other Reaction(s): Hives

## 2024-01-01 NOTE — Care Plan (Signed)
 Pt alert and oriented to self. Pt disoriented to time, situation, and place. Max assist. Pt appetite poor throughout shift. Insulin  administered to correct BG, nutritional insulin  not given due to not eating. Provider notified of poor appetite. WOCN visited pt today, see orders. Pt off floor to PVL.  Shift Summary Insulin  lispro was administered twice during the shift for elevated blood glucose levels.  Wound care was provided for buttocks with moisture barrier cream and zinc -based products, and skin remained intact where visualized.  Patient required maximum assistance for most ADLs and total assistance for toileting, with minimal progress in ambulation.  Fall prevention strategies were consistently implemented, including environmental modifications and frequent checks.  Overall, patient remained comfortable and safe, with no new hospital-acquired injuries documented.   Absence of Hospital-Acquired Illness or Injury: No new hospital-acquired injuries documented; wound on buttocks was managed with moisture barrier cream and zinc -based products, and skin remained intact where visualized. Peripheral IV site was clean, dry, and dressing was changed.   Optimal Comfort and Wellbeing: No pain was reported or observed throughout the shift, and patient expressed comfort verbally. HydrOXYzine  was refused for itching, and no discomfort was noted.   Improved Ability to Complete Activities of Daily Living: Patient required maximum assistance for bathing, grooming, feeding, dressing, and bed mobility, and total assistance for toileting; minimal progress noted in functional mobility with min assist for short ambulation.   Optimal Coping: Patient was not interactive and demonstrated altered mental status, perseveration, and word finding difficulties; family and friends identified as support systems.   Absence of Fall and Fall-Related Injury: Fall reduction interventions were maintained throughout the shift,  including environmental modifications, low bed, and frequent toileting; hourly visual checks confirmed patient remained in bed and awake.

## 2024-01-05 NOTE — Care Plan (Signed)
 Patient resting in bed, intermittently following commands, incomprehensible sounds. Unable to assess orientation level, afebrile.  NSR on monitor, BP WDL, room air-2L [place].   NPO, speech evaluation at bedside, patient did not pass - see note.   No bowel movements during shift. One urine occurrence.   Mitts on right hand due to touching central line.  EEG discontinued. 1u RBC administered. Pending EGD in morning for concern for bloody BM.   Needs met for today.   Problem: Adult Inpatient Plan of Care Goal: Absence of Hospital-Acquired Illness or Injury Outcome: Shift Focus Intervention: Identify and Manage Fall Risk Recent Flowsheet Documentation Taken 01/05/2024 0800 by Merritt Ronnald BRAVO, RN Safety Interventions: . aspiration precautions . bleeding precautions . fall reduction program maintained . lighting adjusted for tasks/safety . low bed . nonskid shoes/slippers when out of bed . infection management . environmental modification . isolation precautions Intervention: Prevent Skin Injury Recent Flowsheet Documentation Taken 01/05/2024 1800 by Merritt Ronnald BRAVO, RN Positioning for Skin: Left Device Skin Pressure Protection: . absorbent pad utilized/changed . adhesive use limited Skin Protection: . adhesive use limited . incontinence pads utilized . tubing/devices free from skin contact Taken 01/05/2024 1600 by Merritt Ronnald BRAVO, RN Positioning for Skin: Right Device Skin Pressure Protection: . absorbent pad utilized/changed . adhesive use limited Skin Protection: . adhesive use limited . incontinence pads utilized . tubing/devices free from skin contact Taken 01/05/2024 1400 by Merritt Ronnald BRAVO, RN Positioning for Skin: Left Device Skin Pressure Protection: . absorbent pad utilized/changed . adhesive use limited Skin Protection: . adhesive use limited . incontinence pads utilized . tubing/devices free from skin contact Taken 01/05/2024 1200 by Merritt Ronnald BRAVO,  RN Positioning for Skin: Right Device Skin Pressure Protection: . absorbent pad utilized/changed . adhesive use limited Skin Protection: . adhesive use limited . incontinence pads utilized . tubing/devices free from skin contact Taken 01/05/2024 1000 by Merritt Ronnald BRAVO, RN Positioning for Skin: Left Device Skin Pressure Protection: . absorbent pad utilized/changed . adhesive use limited Skin Protection: . adhesive use limited . incontinence pads utilized . tubing/devices free from skin contact Taken 01/05/2024 0800 by Merritt Ronnald BRAVO, RN Positioning for Skin: Left Device Skin Pressure Protection: . adhesive use limited . absorbent pad utilized/changed Skin Protection: . adhesive use limited . incontinence pads utilized . tubing/devices free from skin contact Intervention: Prevent and Manage VTE (Venous Thromboembolism) Risk Recent Flowsheet Documentation Taken 01/05/2024 1800 by Merritt Ronnald BRAVO, RN Anti-Embolism Device Type: SCD, Knee Anti-Embolism Device Status: Off Anti-Embolism Device Location: BLE Taken 01/05/2024 1600 by Merritt Ronnald BRAVO, RN Anti-Embolism Device Type: SCD, Knee Anti-Embolism Device Status: Off Anti-Embolism Device Location: BLE Taken 01/05/2024 1400 by Merritt Ronnald BRAVO, RN Anti-Embolism Device Type: SCD, Knee Anti-Embolism Device Status: Off Anti-Embolism Device Location: BLE Taken 01/05/2024 1200 by Merritt Ronnald BRAVO, RN Anti-Embolism Device Type: SCD, Knee Anti-Embolism Device Status: Off Anti-Embolism Device Location: BLE Taken 01/05/2024 1000 by Merritt Ronnald BRAVO, RN Anti-Embolism Device Type: SCD, Knee Anti-Embolism Device Status: Off Anti-Embolism Device Location: BLE Taken 01/05/2024 0800 by Merritt Ronnald BRAVO, RN Anti-Embolism Device Type: SCD, Knee Anti-Embolism Device Status: Off Anti-Embolism Device Location: BLE Intervention: Prevent Infection Recent Flowsheet Documentation Taken 01/05/2024 0800 by Merritt Ronnald BRAVO, RN Infection  Prevention: . hand hygiene promoted . personal protective equipment utilized . rest/sleep promoted . single patient room provided Goal: Optimal Comfort and Wellbeing Outcome: Shift Focus   Problem: Wound Goal: Skin Health and Integrity Outcome: Shift Focus Intervention: Optimize Skin Protection Recent Flowsheet Documentation Taken 01/05/2024  1800 by Valchar, Grace E, RN Pressure Reduction Techniques: . frequent weight shift encouraged . heels elevated off bed . positioned off wounds . pressure points protected . weight shift assistance provided Pressure Reduction Devices: pressure-redistributing mattress utilized Skin Protection: . adhesive use limited . incontinence pads utilized . tubing/devices free from skin contact Taken 01/05/2024 1600 by Merritt Ronnald BRAVO, RN Pressure Reduction Techniques: . frequent weight shift encouraged . heels elevated off bed . positioned off wounds . pressure points protected . weight shift assistance provided Pressure Reduction Devices: pressure-redistributing mattress utilized Skin Protection: . adhesive use limited . incontinence pads utilized . tubing/devices free from skin contact Taken 01/05/2024 1400 by Merritt Ronnald BRAVO, RN Pressure Reduction Techniques: . frequent weight shift encouraged . heels elevated off bed . positioned off wounds . pressure points protected . weight shift assistance provided Pressure Reduction Devices: pressure-redistributing mattress utilized Skin Protection: . adhesive use limited . incontinence pads utilized . tubing/devices free from skin contact Taken 01/05/2024 1200 by Merritt Ronnald BRAVO, RN Pressure Reduction Techniques: . frequent weight shift encouraged . heels elevated off bed . positioned off wounds . pressure points protected . weight shift assistance provided Pressure Reduction Devices: pressure-redistributing mattress utilized Skin Protection: . adhesive use limited . incontinence pads  utilized . tubing/devices free from skin contact Taken 01/05/2024 1000 by Merritt Ronnald BRAVO, RN Pressure Reduction Techniques: . frequent weight shift encouraged . heels elevated off bed . positioned off wounds . pressure points protected . weight shift assistance provided Pressure Reduction Devices: pressure-redistributing mattress utilized Skin Protection: . adhesive use limited . incontinence pads utilized . tubing/devices free from skin contact Taken 01/05/2024 0800 by Merritt Ronnald BRAVO, RN Activity Management: bedrest Pressure Reduction Techniques: . frequent weight shift encouraged . heels elevated off bed . positioned off wounds . pressure points protected . weight shift assistance provided Head of Bed (HOB) Positioning: HOB at 30-45 degrees Pressure Reduction Devices: pressure-redistributing mattress utilized Skin Protection: . adhesive use limited . incontinence pads utilized . tubing/devices free from skin contact Goal: Optimal Wound Healing Outcome: Shift Focus   Problem: Skin Injury Risk Increased Goal: Skin Health and Integrity Outcome: Shift Focus Intervention: Optimize Skin Protection Recent Flowsheet Documentation Taken 01/05/2024 1800 by Merritt Ronnald BRAVO, RN Pressure Reduction Techniques: . frequent weight shift encouraged . heels elevated off bed . positioned off wounds . pressure points protected . weight shift assistance provided Pressure Reduction Devices: pressure-redistributing mattress utilized Skin Protection: . adhesive use limited . incontinence pads utilized . tubing/devices free from skin contact Taken 01/05/2024 1600 by Merritt Ronnald BRAVO, RN Pressure Reduction Techniques: . frequent weight shift encouraged . heels elevated off bed . positioned off wounds . pressure points protected . weight shift assistance provided Pressure Reduction Devices: pressure-redistributing mattress utilized Skin Protection: . adhesive use limited .  incontinence pads utilized . tubing/devices free from skin contact Taken 01/05/2024 1400 by Merritt Ronnald BRAVO, RN Pressure Reduction Techniques: . frequent weight shift encouraged . heels elevated off bed . positioned off wounds . pressure points protected . weight shift assistance provided Pressure Reduction Devices: pressure-redistributing mattress utilized Skin Protection: . adhesive use limited . incontinence pads utilized . tubing/devices free from skin contact Taken 01/05/2024 1200 by Merritt Ronnald BRAVO, RN Pressure Reduction Techniques: . frequent weight shift encouraged . heels elevated off bed . positioned off wounds . pressure points protected . weight shift assistance provided Pressure Reduction Devices: pressure-redistributing mattress utilized Skin Protection: . adhesive use limited . incontinence pads utilized . tubing/devices free from skin  contact Taken 01/05/2024 1000 by Valchar, Grace E, RN Pressure Reduction Techniques: . frequent weight shift encouraged . heels elevated off bed . positioned off wounds . pressure points protected . weight shift assistance provided Pressure Reduction Devices: pressure-redistributing mattress utilized Skin Protection: . adhesive use limited . incontinence pads utilized . tubing/devices free from skin contact Taken 01/05/2024 0800 by Merritt Ronnald BRAVO, RN Activity Management: bedrest Pressure Reduction Techniques: . frequent weight shift encouraged . heels elevated off bed . positioned off wounds . pressure points protected . weight shift assistance provided Head of Bed (HOB) Positioning: HOB at 30-45 degrees Pressure Reduction Devices: pressure-redistributing mattress utilized Skin Protection: . adhesive use limited . incontinence pads utilized . tubing/devices free from skin contact

## 2024-01-06 LAB — FUNGITELL BETA-D-GLUCAN: Fungitell Value:: 62.021 pg/mL

## 2024-01-31 ENCOUNTER — Other Ambulatory Visit: Payer: Self-pay | Admitting: Obstetrics and Gynecology

## 2024-02-08 ENCOUNTER — Emergency Department
Admission: EM | Admit: 2024-02-08 | Discharge: 2024-02-08 | Disposition: A | Discharge status: Home / Self Care | Source: Other Acute Inpatient Hospital | Attending: Emergency Medicine | Admitting: Emergency Medicine

## 2024-02-08 ENCOUNTER — Encounter: Payer: Self-pay | Admitting: Emergency Medicine

## 2024-02-08 ENCOUNTER — Emergency Department

## 2024-02-08 ENCOUNTER — Other Ambulatory Visit: Payer: Self-pay

## 2024-02-08 DIAGNOSIS — I10 Essential (primary) hypertension: Secondary | ICD-10-CM | POA: Insufficient documentation

## 2024-02-08 DIAGNOSIS — R1030 Lower abdominal pain, unspecified: Secondary | ICD-10-CM | POA: Diagnosis present

## 2024-02-08 DIAGNOSIS — K529 Noninfective gastroenteritis and colitis, unspecified: Secondary | ICD-10-CM | POA: Diagnosis not present

## 2024-02-08 DIAGNOSIS — E039 Hypothyroidism, unspecified: Secondary | ICD-10-CM | POA: Diagnosis not present

## 2024-02-08 DIAGNOSIS — E119 Type 2 diabetes mellitus without complications: Secondary | ICD-10-CM | POA: Diagnosis not present

## 2024-02-08 LAB — COMPREHENSIVE METABOLIC PANEL WITH GFR
ALT: 16 U/L (ref 0–44)
AST: 47 U/L — ABNORMAL HIGH (ref 15–41)
Albumin: 2.4 g/dL — ABNORMAL LOW (ref 3.5–5.0)
Alkaline Phosphatase: 131 U/L — ABNORMAL HIGH (ref 38–126)
Anion gap: 17 — ABNORMAL HIGH (ref 5–15)
BUN: 27 mg/dL — ABNORMAL HIGH (ref 6–20)
CO2: 28 mmol/L (ref 22–32)
Calcium: 8.1 mg/dL — ABNORMAL LOW (ref 8.9–10.3)
Chloride: 95 mmol/L — ABNORMAL LOW (ref 98–111)
Creatinine, Ser: 5.56 mg/dL — ABNORMAL HIGH (ref 0.44–1.00)
GFR, Estimated: 8 mL/min — ABNORMAL LOW (ref >60)
Glucose, Bld: 110 mg/dL — ABNORMAL HIGH (ref 70–99)
Potassium: 4 mmol/L (ref 3.5–5.1)
Sodium: 140 mmol/L (ref 135–145)
Total Bilirubin: 1.2 mg/dL (ref 0.0–1.2)
Total Protein: 5.9 g/dL — ABNORMAL LOW (ref 6.5–8.1)

## 2024-02-08 LAB — CBC WITH DIFFERENTIAL/PLATELET
Abs Immature Granulocytes: 0.19 K/uL — ABNORMAL HIGH (ref 0.00–0.07)
Basophils Absolute: 0.1 K/uL (ref 0.0–0.1)
Basophils Relative: 1 %
Eosinophils Absolute: 0.1 K/uL (ref 0.0–0.5)
Eosinophils Relative: 1 %
HCT: 28.4 % — ABNORMAL LOW (ref 36.0–46.0)
Hemoglobin: 9.3 g/dL — ABNORMAL LOW (ref 12.0–15.0)
Immature Granulocytes: 2 %
Lymphocytes Relative: 12 %
Lymphs Abs: 1.1 K/uL (ref 0.7–4.0)
MCH: 31.5 pg (ref 26.0–34.0)
MCHC: 32.7 g/dL (ref 30.0–36.0)
MCV: 96.3 fL (ref 80.0–100.0)
Monocytes Absolute: 1.1 K/uL — ABNORMAL HIGH (ref 0.1–1.0)
Monocytes Relative: 12 %
Neutro Abs: 6.7 K/uL (ref 1.7–7.7)
Neutrophils Relative %: 72 %
Platelets: 188 K/uL (ref 150–400)
RBC: 2.95 MIL/uL — ABNORMAL LOW (ref 3.87–5.11)
RDW: 27.9 % — ABNORMAL HIGH (ref 11.5–15.5)
Smear Review: NORMAL
WBC: 9.3 K/uL (ref 4.0–10.5)
nRBC: 1.2 % — ABNORMAL HIGH (ref 0.0–0.2)

## 2024-02-08 MED ORDER — AZITHROMYCIN 500 MG PO TABS
500.0000 mg | ORAL_TABLET | Freq: Once | ORAL | Status: AC
  Administered 2024-02-08: 500 mg via ORAL
  Filled 2024-02-08: qty 1

## 2024-02-08 MED ORDER — ONDANSETRON HCL 4 MG/2ML IJ SOLN
4.0000 mg | Freq: Once | INTRAMUSCULAR | Status: AC
  Administered 2024-02-08: 4 mg via INTRAVENOUS
  Filled 2024-02-08: qty 2

## 2024-02-08 MED ORDER — KETOROLAC TROMETHAMINE 15 MG/ML IJ SOLN
15.0000 mg | Freq: Once | INTRAMUSCULAR | Status: AC
  Administered 2024-02-08: 15 mg via INTRAVENOUS
  Filled 2024-02-08: qty 1

## 2024-02-08 MED ORDER — AZITHROMYCIN 250 MG PO TABS: ORAL_TABLET | ORAL | 0 refills | Status: DC

## 2024-02-08 MED ORDER — MORPHINE SULFATE (PF) 4 MG/ML IV SOLN
4.0000 mg | Freq: Once | INTRAVENOUS | Status: AC
  Administered 2024-02-08: 4 mg via INTRAVENOUS
  Filled 2024-02-08: qty 1

## 2024-02-08 MED ORDER — SODIUM CHLORIDE 0.9 % IV BOLUS
500.0000 mL | Freq: Once | INTRAVENOUS | Status: AC
  Administered 2024-02-08: 500 mL via INTRAVENOUS

## 2024-02-08 NOTE — ED Triage Notes (Signed)
 Pt arrives via EMS from dialysis center for vag itching/burning since Monday

## 2024-02-08 NOTE — ED Triage Notes (Signed)
 Pt arrives via EMS from dialysis center for vaginal itching/burning since Monday. Pt did not receive her tx.

## 2024-02-08 NOTE — Discharge Instructions (Signed)
 Follow-up with your regular doctor.  Return to the emergency department if worse Take the medication as prescribed Tylenol  for pain as needed

## 2024-02-08 NOTE — ED Provider Notes (Signed)
 [facility] Provider Note    Event Date/Time   First MD Initiated Contact with Patient 02/08/24 [phone]     (approximate)   History   Vaginal Itching   HPI  Tonya Myers is a 58 y.o. female history of kidney failure, diabetes, hypothyroidism, genital herpes, CLL, and hypertension presents emergency department complaining of lower abdominal pain.  Triage note stated vaginal itching.  Patient states she has no vaginal itching.  Has lower abdominal pain.  States there was some itching at the lower abdomen but they put powder on her which helped.  States they did that at the facility.  She was post to do dialysis this morning at 5:30 AM.  States she really likes her dialysis center but the abdominal pain was bad enough that they brought her to the emergency department.  Denies fever, chills, vomiting or diarrhea.  Patient does not make urine      Physical Exam   Triage Vital Signs: ED Triage Vitals [02/08/24 0635]  Encounter Vitals Group     BP (!) 159/74     Girls Systolic BP Percentile      Girls Diastolic BP Percentile      Boys Systolic BP Percentile      Boys Diastolic BP Percentile      Pulse Rate 78     Resp 18     Temp 98.8 F (37.1 C)     Temp Source Oral     SpO2 100 %     Weight      Height      Head Circumference      [facility] Flow      Pain Score 10     Pain Loc      Pain Education      Exclude from Growth Chart     Most recent vital signs: Vitals:   02/08/24 0635 02/08/24 0959  BP: (!) 159/74 (!) 155/87  Pulse: 78 80  Resp: 18 17  Temp: 98.8 F (37.1 C) 98.3 F (36.8 C)  SpO2: 100% 100%     General: Awake, no distress.   CV:  Good peripheral perfusion. Resp:  Normal effort.  Abd:  No distention.  Abdomen tender in the lower right quadrant and suprapubic, no abscess noted, no redness noted underneath abdominal fold Other:      ED Results / Procedures / Treatments   Labs (all labs ordered are listed, but only  abnormal results are displayed) Labs Reviewed  COMPREHENSIVE METABOLIC PANEL WITH GFR - Abnormal; Notable for the following components:      Result Value   Chloride 95 (*)    Glucose, Bld 110 (*)    BUN 27 (*)    Creatinine, Ser 5.56 (*)    Calcium  8.1 (*)    Total Protein 5.9 (*)    Albumin  2.4 (*)    AST 47 (*)    Alkaline Phosphatase 131 (*)    GFR, Estimated 8 (*)    Anion gap 17 (*)    All other components within normal limits  CBC WITH DIFFERENTIAL/PLATELET - Abnormal; Notable for the following components:   RBC 2.95 (*)    Hemoglobin 9.3 (*)    HCT 28.4 (*)    RDW 27.9 (*)    nRBC 1.2 (*)    Monocytes Absolute 1.1 (*)    Abs Immature Granulocytes 0.19 (*)    All other components within normal limits     EKG  RADIOLOGY CT abdomen pelvis without contrast due to patient's contrast allergy    PROCEDURES:   Procedures  Critical Care:  no Chief Complaint  Patient presents with   Vaginal Itching      MEDICATIONS ORDERED IN ED: Medications  morphine  (PF) 4 MG/ML injection 4 mg (4 mg Intravenous Given 02/08/24 0845)  ondansetron  (ZOFRAN ) injection 4 mg (4 mg Intravenous Given 02/08/24 0845)  sodium chloride  0.9 % bolus 500 mL (0 mLs Intravenous Stopped 02/08/24 0956)  ketorolac  (TORADOL ) 15 MG/ML injection 15 mg (15 mg Intravenous Given 02/08/24 0957)  azithromycin  (ZITHROMAX ) tablet 500 mg (500 mg Oral Given 02/08/24 0957)     IMPRESSION / MDM / ASSESSMENT AND PLAN / ED COURSE  I reviewed the triage vital signs and the nursing notes.                              Differential diagnosis includes, but is not limited to, abdominal pain, vaginal itching, yeast, abscess, acute appendicitis  Patient's presentation is most consistent with acute illness / injury with system symptoms.   Medications given: Morphine  4 mg IV for pain, Zofran , normal saline 500 mL  The patient's vitals appear to be normal, she is afebrile, does not appear to be septic at  this time.  Will not do sepsis protocols.  However we will do a CT abdomen pelvis without contrast secondary to her having a contrast allergy.  Labs ordered  Labs reassuring  CT abdomen pelvis without contrast reassuring, independently reviewed interpreted by me  Discussed with Dr. Arlander.  Recommends we treat her for colitis.  Given a prescription for Zithromax .  Given a dose here in the ED.  Will take 500 mg for 2 more days.  Follow-up with her regular doctor.  Follow-up with her dialysis center.  Return emergency department for worsening.  Patient was transported to [facility] in stable condition.     FINAL CLINICAL IMPRESSION(S) / ED DIAGNOSES   Final diagnoses:  Colitis     Rx / DC Orders   ED Discharge Orders          Ordered    azithromycin  (ZITHROMAX ) 250 MG tablet        02/08/24 0948             Note:  This document was prepared using Dragon voice recognition software and may include unintentional dictation errors.    Gasper Devere ORN, PA-C 02/08/24 1358    Viviann Pastor, MD 02/08/24 [phone]

## 2024-02-24 ENCOUNTER — Encounter: Payer: Self-pay | Admitting: Oncology

## 2024-02-27 ENCOUNTER — Other Ambulatory Visit

## 2024-02-27 ENCOUNTER — Ambulatory Visit: Admitting: Oncology

## 2024-02-28 ENCOUNTER — Inpatient Hospital Stay: Attending: Oncology

## 2024-02-28 ENCOUNTER — Encounter: Payer: Self-pay | Admitting: Oncology

## 2024-02-28 ENCOUNTER — Inpatient Hospital Stay: Admitting: Oncology

## 2024-02-28 VITALS — BP 122/59 | HR 66 | Temp 98.8°F | Resp 18 | Ht 72.0 in | Wt 194.4 lb

## 2024-02-28 DIAGNOSIS — G629 Polyneuropathy, unspecified: Secondary | ICD-10-CM | POA: Insufficient documentation

## 2024-02-28 DIAGNOSIS — I708 Atherosclerosis of other arteries: Secondary | ICD-10-CM | POA: Diagnosis not present

## 2024-02-28 DIAGNOSIS — J45909 Unspecified asthma, uncomplicated: Secondary | ICD-10-CM | POA: Insufficient documentation

## 2024-02-28 DIAGNOSIS — D693 Immune thrombocytopenic purpura: Secondary | ICD-10-CM | POA: Diagnosis not present

## 2024-02-28 DIAGNOSIS — E039 Hypothyroidism, unspecified: Secondary | ICD-10-CM | POA: Insufficient documentation

## 2024-02-28 DIAGNOSIS — N186 End stage renal disease: Secondary | ICD-10-CM | POA: Insufficient documentation

## 2024-02-28 DIAGNOSIS — J90 Pleural effusion, not elsewhere classified: Secondary | ICD-10-CM | POA: Diagnosis not present

## 2024-02-28 DIAGNOSIS — C9110 Chronic lymphocytic leukemia of B-cell type not having achieved remission: Secondary | ICD-10-CM | POA: Diagnosis not present

## 2024-02-28 DIAGNOSIS — M129 Arthropathy, unspecified: Secondary | ICD-10-CM | POA: Insufficient documentation

## 2024-02-28 DIAGNOSIS — Z79624 Long term (current) use of inhibitors of nucleotide synthesis: Secondary | ICD-10-CM | POA: Diagnosis not present

## 2024-02-28 DIAGNOSIS — Z803 Family history of malignant neoplasm of breast: Secondary | ICD-10-CM | POA: Insufficient documentation

## 2024-02-28 DIAGNOSIS — M8580 Other specified disorders of bone density and structure, unspecified site: Secondary | ICD-10-CM | POA: Diagnosis not present

## 2024-02-28 DIAGNOSIS — I700 Atherosclerosis of aorta: Secondary | ICD-10-CM | POA: Diagnosis not present

## 2024-02-28 DIAGNOSIS — I120 Hypertensive chronic kidney disease with stage 5 chronic kidney disease or end stage renal disease: Secondary | ICD-10-CM | POA: Diagnosis not present

## 2024-02-28 DIAGNOSIS — R531 Weakness: Secondary | ICD-10-CM | POA: Insufficient documentation

## 2024-02-28 DIAGNOSIS — R7989 Other specified abnormal findings of blood chemistry: Secondary | ICD-10-CM | POA: Diagnosis not present

## 2024-02-28 DIAGNOSIS — E1122 Type 2 diabetes mellitus with diabetic chronic kidney disease: Secondary | ICD-10-CM | POA: Insufficient documentation

## 2024-02-28 DIAGNOSIS — Z992 Dependence on renal dialysis: Secondary | ICD-10-CM | POA: Diagnosis not present

## 2024-02-28 DIAGNOSIS — Z79899 Other long term (current) drug therapy: Secondary | ICD-10-CM | POA: Insufficient documentation

## 2024-02-28 DIAGNOSIS — D649 Anemia, unspecified: Secondary | ICD-10-CM | POA: Diagnosis not present

## 2024-02-28 DIAGNOSIS — R5383 Other fatigue: Secondary | ICD-10-CM | POA: Diagnosis not present

## 2024-02-28 LAB — CBC WITH DIFFERENTIAL/PLATELET
Abs Immature Granulocytes: 0.02 K/uL (ref 0.00–0.07)
Basophils Absolute: 0.1 K/uL (ref 0.0–0.1)
Basophils Relative: 3 %
Eosinophils Absolute: 0.2 K/uL (ref 0.0–0.5)
Eosinophils Relative: 4 %
HCT: 35.6 % — ABNORMAL LOW (ref 36.0–46.0)
Hemoglobin: 11 g/dL — ABNORMAL LOW (ref 12.0–15.0)
Immature Granulocytes: 1 %
Lymphocytes Relative: 25 %
Lymphs Abs: 1.1 K/uL (ref 0.7–4.0)
MCH: 30.2 pg (ref 26.0–34.0)
MCHC: 30.9 g/dL (ref 30.0–36.0)
MCV: 97.8 fL (ref 80.0–100.0)
Monocytes Absolute: 0.4 K/uL (ref 0.1–1.0)
Monocytes Relative: 9 %
Neutro Abs: 2.6 K/uL (ref 1.7–7.7)
Neutrophils Relative %: 58 %
Platelets: 197 K/uL (ref 150–400)
RBC: 3.64 MIL/uL — ABNORMAL LOW (ref 3.87–5.11)
RDW: 21.3 % — ABNORMAL HIGH (ref 11.5–15.5)
WBC: 4.4 K/uL (ref 4.0–10.5)
nRBC: 0 % (ref 0.0–0.2)

## 2024-02-28 MED ORDER — SULFAMETHOXAZOLE-TRIMETHOPRIM 800-160 MG PO TABS: 1.0000 | ORAL_TABLET | Freq: Two times a day (BID) | ORAL | 0 refills | Status: DC

## 2024-02-28 NOTE — Progress Notes (Unsigned)
 Call the dialysis place to get patients weight, and call [facility] to get the correct pharmacy.  She feels like she might have an infection due to her bowels being able to touch her vaginal area.

## 2024-02-28 NOTE — Progress Notes (Unsigned)
 [facility]  [phone] [phone]  ID: Tonya Myers OB: 1966/02/17  MR#: [id number]  RDW#:[id number]  Patient Care Team: Valora Lynwood FALCON, MD as PCP - General (Family Medicine) Jacobo Evalene PARAS, MD as Consulting Physician (Oncology)  CHIEF COMPLAINT: History of CLL, ITP.  INTERVAL HISTORY: Patient returns to clinic today for repeat laboratory work and routine 71-month evaluation.  She continues to have significant chronic weakness and fatigue.  She does not make much urine, but is complaining of symptoms of UTI.  She continues to have multiple other medical complaints as well.  She denies any recent fevers or illnesses.  She has a chronic peripheral neuropathy, but no other neurologic complaints.  She has a fair appetite, but denies weight loss.  She does not complain of any night sweats or other B symptoms.  She has no chest pain, shortness of breath, cough, or hemoptysis.  She denies any nausea, vomiting, constipation, or diarrhea.  Patient offers no further specific complaints today.  REVIEW OF SYSTEMS:   Review of Systems  Constitutional:  Positive for malaise/fatigue. Negative for fever and weight loss.  Respiratory: Negative.  Negative for cough, hemoptysis and shortness of breath.   Cardiovascular: Negative.  Negative for chest pain and leg swelling.  Gastrointestinal: Negative.  Negative for abdominal pain and diarrhea.  Genitourinary:  Positive for urgency. Negative for dysuria.  Musculoskeletal: Negative.  Negative for back pain.  Skin: Negative.  Negative for itching and rash.  Neurological:  Positive for sensory change and weakness. Negative for dizziness, focal weakness and headaches.  Psychiatric/Behavioral: Negative.  The patient is not nervous/anxious.     As per HPI. Otherwise, a complete review of systems is negative.  PAST MEDICAL HISTORY: Past Medical History:  Diagnosis Date   Acute hemorrhoid 03/11/2015    Anxiety    Arthritis    Asthma    Cancer (HCC)    Chronic back pain    CLL (chronic lymphocytic leukemia) (HCC)    Depression    Diabetes mellitus without complication (HCC)    Genital herpes    type 2   Hypertension    Hypothyroidism    Renal disorder    Vertigo     PAST SURGICAL HISTORY: Past Surgical History:  Procedure Laterality Date   ABDOMINAL HYSTERECTOMY     BILATERAL SALPINGOOPHORECTOMY  2009   BREAST BIOPSY Right 05/17/2016   FIBROADENOMATOUS CHANGE AND SCLEROSING ADENOSIS WITH   COLONOSCOPY WITH PROPOFOL  N/A 06/16/2015   Procedure: COLONOSCOPY WITH PROPOFOL ;  Surgeon: Donnice Vaughn Manes, MD;  Location: [facility] ENDOSCOPY;  Service: Endoscopy;  Laterality: N/A;   DIALYSIS/PERMA CATHETER INSERTION N/A 12/17/2020   Procedure: DIALYSIS/PERMA CATHETER INSERTION;  Surgeon: Marea Selinda RAMAN, MD;  Location: [facility] INVASIVE CV LAB;  Service: Cardiovascular;  Laterality: N/A;   LAPAROSCOPIC SUPRACERVICAL HYSTERECTOMY  2009   due to leio   OOPHORECTOMY     TEMPORARY DIALYSIS CATHETER N/A 12/10/2020   Procedure: TEMPORARY DIALYSIS CATHETER;  Surgeon: Jama Cordella MATSU, MD;  Location: [facility] INVASIVE CV LAB;  Service: Cardiovascular;  Laterality: N/A;   THORACIC LAMINECTOMY FOR EPIDURAL ABSCESS Bilateral 06/17/2020   Procedure: THORACIC LAMINECTOMY FOR EPIDURAL ABSCESS;  Surgeon: Bluford Standing, MD;  Location: [facility] ORS;  Service: Neurosurgery;  Laterality: Bilateral;    FAMILY HISTORY: Family History  Problem Relation Age of Onset   Cancer Paternal Aunt    Breast cancer Maternal Aunt 35    ADVANCED DIRECTIVES (Y/N):  N  HEALTH MAINTENANCE: Social History   Tobacco  Use   Smoking status: Never   Smokeless tobacco: Never  Vaping Use   Vaping status: Never Used  Substance Use Topics   Alcohol use: Not Currently    Alcohol/week: 1.0 standard drink of alcohol    Types: 1 Cans of beer per week   Drug use: No     Colonoscopy:  PAP:  Bone density:  Lipid panel:  Allergies   Allergen Reactions   Amoxapine     Other Reaction(s): Unknown   Latex Other (See Comments)   Ceftazidime     Other reaction(s): Confusion, Hallucination, Unknown Encephalopathy - improved after dialysis/some concern for cephalosporin neurotoxicity Tolerated without confusion 06/2021. Encephalopathy - improved after dialysis/some concern for cephalosporin neurotoxicity Tolerated without confusion 06/2021.    Iodinated Contrast Media Hives and Rash    Other Reaction(s): Hives   Penicillins     Other Reaction(s): Unknown    Current Outpatient Medications  Medication Sig Dispense Refill   acetaminophen  (TYLENOL ) 325 MG tablet Take 2 tablets (650 mg total) by mouth every 6 (six) hours as needed for mild pain (or Fever >/= 101).     albuterol  (VENTOLIN  HFA) 108 (90 Base) MCG/ACT inhaler Inhale 2 puffs into the lungs every 6 (six) hours as needed for wheezing or shortness of breath.     amitriptyline  (ELAVIL ) 25 MG tablet Take 1 tablet (25 mg total) by mouth at bedtime. 30 tablet 2   amLODipine  (NORVASC ) 5 MG tablet Take 1 tablet (5 mg total) by mouth daily. 30 tablet 11   atorvastatin  (LIPITOR) 20 MG tablet Take 20 mg by mouth daily.     azithromycin  (ZITHROMAX ) 250 MG tablet 2 pills for 2 days starting on 02/09/24 6 each 0   Blood Glucose Monitoring Suppl DEVI 1 each by Does not apply route 3 (three) times daily. May dispense any manufacturer covered by patient's insurance. 1 each 0   cetirizine (ZYRTEC) 10 MG tablet Take 1 tablet by mouth daily.     fluticasone  (FLONASE ) 50 MCG/ACT nasal spray Place 2 sprays into both nostrils daily.     folic acid  (FOLVITE ) 1 MG tablet Take 1 tablet by mouth daily.     Glucose Blood (BLOOD GLUCOSE TEST STRIPS) STRP 1 each by Does not apply route 3 (three) times daily. Use as directed to check blood sugar. May dispense any manufacturer covered by patient's insurance and fits patient's device. 100 strip 0   guaiFENesin -dextromethorphan  (ROBITUSSIN DM) 100-10  MG/5ML syrup Take 5 mLs by mouth every 4 (four) hours as needed for cough. 118 mL 0   hydrOXYzine  (ATARAX ) 25 MG tablet Take 1 tablet (25 mg total) by mouth 3 (three) times daily as needed for itching. 30 tablet 0   Lancet Device MISC 1 each by Does not apply route 3 (three) times daily. May dispense any manufacturer covered by patient's insurance. 1 each 0   Lancets MISC 1 each by Does not apply route 3 (three) times daily. Use as directed to check blood sugar. May dispense any manufacturer covered by patient's insurance and fits patient's device. 100 each 0   levothyroxine  (SYNTHROID ) 125 MCG tablet Take 125 mcg by mouth daily. Take 1 tablet daily except Sunday take 1 and 1/2 tablet (187.5 mg).     lipase/protease/amylase (CREON ) 12000-38000 units CPEP capsule Take 1 capsule by mouth 3 (three) times daily with meals.     midodrine  (PROAMATINE ) 2.5 MG tablet Take 2.5 mg by mouth as directed. Only on dialysis days. (Monday Wednesday and Friday)  multivitamin (RENA-VIT) TABS tablet Take 1 tablet by mouth at bedtime.  0   ondansetron  (ZOFRAN -ODT) 8 MG disintegrating tablet Take 1 tablet (8 mg total) by mouth every 8 (eight) hours as needed for nausea or vomiting. 30 tablet 0   pantoprazole  (PROTONIX ) 40 MG tablet Take 1 tablet (40 mg total) by mouth 2 (two) times daily for 28 days, THEN 1 tablet (40 mg total) daily. 30 tablet 1   sulfamethoxazole -trimethoprim  (BACTRIM  DS) 800-160 MG tablet Take 1 tablet by mouth 2 (two) times daily. 10 tablet 0   traMADol  (ULTRAM ) 50 MG tablet Take 1 tablet (50 mg total) by mouth at bedtime. 10 tablet 0   valACYclovir  (VALTREX ) 500 MG tablet Take 500 mg by mouth as directed. Take 1 tablet daily on dialysis days.(Monday, Wednesday and Friday)     zinc  sulfate 220 (50 Zn) MG capsule Take 1 capsule (220 mg total) by mouth daily.     calcium  acetate (PHOSLO ) 667 MG capsule Take 1,334 mg by mouth 3 (three) times daily with meals. (Patient not taking: Reported on 02/28/2024)      No current facility-administered medications for this visit.   Facility-Administered Medications Ordered in Other Visits  Medication Dose Route Frequency Provider Last Rate Last Admin   capsaicin  topical system 8 % patch 4 patch  4 patch Topical Once Lateef, Bilal, MD        OBJECTIVE: Vitals:   02/28/24 1010  BP: (!) 122/59  Pulse: 66  Resp: 18  Temp: 98.8 F (37.1 C)  SpO2: 99%      Body mass index is 26.37 kg/m.    ECOG FS:2 - Symptomatic, <50% confined to bed  General: Well-developed, well-nourished, no acute distress.  Sitting in a wheelchair. Eyes: Pink conjunctiva, anicteric sclera. HEENT: Normocephalic, moist mucous membranes. Lungs: No audible wheezing or coughing. Heart: Regular rate and rhythm. Abdomen: Soft, nontender, no obvious distention. Musculoskeletal: No edema, cyanosis, or clubbing. Neuro: Alert, answering all questions appropriately. Cranial nerves grossly intact. Skin: No rashes or petechiae noted. Psych: Normal affect.  LAB RESULTS:  Lab Results  Component Value Date   NA 140 02/08/2024   K 4.0 02/08/2024   CL 95 (L) 02/08/2024   CO2 28 02/08/2024   GLUCOSE 110 (H) 02/08/2024   BUN 27 (H) 02/08/2024   CREATININE 5.56 (H) 02/08/2024   CALCIUM  8.1 (L) 02/08/2024   PROT 5.9 (L) 02/08/2024   ALBUMIN  2.4 (L) 02/08/2024   AST 47 (H) 02/08/2024   ALT 16 02/08/2024   ALKPHOS 131 (H) 02/08/2024   BILITOT 1.2 02/08/2024   GFRNONAA 8 (L) 02/08/2024   GFRAA >60 01/17/2020    Lab Results  Component Value Date   WBC 4.4 02/28/2024   NEUTROABS 2.6 02/28/2024   HGB 11.0 (L) 02/28/2024   HCT 35.6 (L) 02/28/2024   MCV 97.8 02/28/2024   PLT 197 02/28/2024     STUDIES: CT ABDOMEN PELVIS WO CONTRAST Result Date: 02/08/2024 CLINICAL DATA:  RLQ abdominal pain EXAM: CT ABDOMEN AND PELVIS WITHOUT CONTRAST TECHNIQUE: Multidetector CT imaging of the abdomen and pelvis was performed following the standard protocol without IV contrast. RADIATION DOSE  REDUCTION: This exam was performed according to the departmental dose-optimization program which includes automated exposure control, adjustment of the mA and/or kV according to patient size and/or use of iterative reconstruction technique. COMPARISON:  November 21, 2020, July 25, 2023 FINDINGS: Of note, the lack of intravenous contrast limits evaluation of the solid organ parenchyma and vascularity. Lower chest: Small right  and trace left pleural effusions with bibasilar atelectasis. No cardiomegaly. Small to moderate volume pericardial effusion. Hepatobiliary: Unchanged hypodensity in the posterolateral right hepatic lobe measuring 1.3 cm, likely reflecting a intrahepatic hemangioma.Multiple small radiopaque gallstones. No wall thickening. No intrahepatic or extrahepatic biliary ductal dilation. Pancreas: Scattered chunky calcification throughout the pancreatic parenchyma, likely reflecting changes of remote pancreatitis. No mass or main ductal dilation. No peripancreatic inflammation or fluid collection. Spleen: Normal size. No mass. Adrenals/Urinary Tract: No adrenal masses. Diffuse renal cortical atrophy. Moderate bilateral hydroureteronephrosis. No nephrolithiasis or obstructive ureteral calculi. The urinary bladder is distended with mild circumferential wall thickening Stomach/Bowel:Well-positioned percutaneous gastrostomy tube. The stomach is decompressed without focal abnormality. No small bowel wall thickening or inflammation. No small bowel obstruction.Normal appendix. Mild wall thickening throughout the rectosigmoid colon. Moderate presacral stranding present. Vascular/Lymphatic: No aortic aneurysm. Diffuse aortoiliac atherosclerosis. No intraabdominal or pelvic lymphadenopathy. Reproductive: Hysterectomy. No concerning adnexal mass. No free pelvic fluid. Other: No pneumoperitoneum or ascites. Musculoskeletal: No acute fracture or destructive lesion. Osteopenia. Multilevel degenerative disc disease of the  spine. IMPRESSION: 1. Moderate bilateral hydroureteronephrosis. In the absence of nephrolithiasis and obstructive ureteral calculi, this may be due to an ascending urinary tract infection given the bladder wall thickening. Correlation with urinalysis recommended. 2. Mild circumferential wall thickening throughout the rectosigmoid colon with presacral stranding present. While this could be due to underdistention, an infectious or inflammatory proctocolitis could also have this appearance, in the correct clinical context. 3. Small right and trace volume left pleural effusions. Aortic Atherosclerosis (ICD10-I70.0). Electronically Signed   By: Rogelia Myers M.D.   On: 02/08/2024 09:22     ASSESSMENT: History of CLL, ITP  PLAN:    CLL: In complete remission.  Bone marrow biopsy on August 03, 2023 did not reveal any monoclonal B-cell population and no other significant pathology.  Cytogenetics was reported as normal. Peripheral blood flow cytometry is negative.  CT scan of her abdomen and pelvis on February 08, 2024 did not reveal any significant lymphadenopathy.  She last received treatment with Rituxan  and Treanda  on April 09, 2015.  No intervention is needed at this time.  Return to clinic in 6 months with repeat laboratory work and evaluation by APP. Leukopenia: Resolved. Anemia: Chronic and unchanged.  Patient's hemoglobin is 11.0.  Recent iron stores are within normal limits.   Elevated ferritin: Likely secondary to acute phase reactant from dialysis.   ITP: Resolved.  Patient previously responded to prednisone  taper.  Bone marrow biopsy results negative as above.  Previously, all of her other laboratory work including SPEP are either no or within normal limits.  No intervention needed. End-stage renal disease: Patient reports she receives dialysis on Monday, Wednesdays, and Fridays.  Patient should be eligible for kidney transplant from a hematology standpoint.   Patient expressed understanding  and was in agreement with this plan. She also understands that She can call clinic at any time with any questions, concerns, or complaints.    Evalene JINNY Reusing, MD   02/29/2024 9:16 AM

## 2024-02-29 ENCOUNTER — Encounter: Payer: Self-pay | Admitting: Oncology

## 2024-03-04 ENCOUNTER — Emergency Department
Admission: EM | Admit: 2024-03-04 | Discharge: 2024-03-04 | Disposition: A | Discharge status: Skilled Nursing Facility | Attending: Emergency Medicine | Admitting: Emergency Medicine

## 2024-03-04 ENCOUNTER — Other Ambulatory Visit: Payer: Self-pay

## 2024-03-04 DIAGNOSIS — Z992 Dependence on renal dialysis: Secondary | ICD-10-CM | POA: Insufficient documentation

## 2024-03-04 DIAGNOSIS — N949 Unspecified condition associated with female genital organs and menstrual cycle: Secondary | ICD-10-CM | POA: Diagnosis not present

## 2024-03-04 DIAGNOSIS — Z856 Personal history of leukemia: Secondary | ICD-10-CM | POA: Diagnosis not present

## 2024-03-04 DIAGNOSIS — N186 End stage renal disease: Secondary | ICD-10-CM | POA: Insufficient documentation

## 2024-03-04 DIAGNOSIS — R300 Dysuria: Secondary | ICD-10-CM | POA: Diagnosis present

## 2024-03-04 LAB — CBC
HCT: 32.9 % — ABNORMAL LOW (ref 36.0–46.0)
Hemoglobin: 10.6 g/dL — ABNORMAL LOW (ref 12.0–15.0)
MCH: 31.1 pg (ref 26.0–34.0)
MCHC: 32.2 g/dL (ref 30.0–36.0)
MCV: 96.5 fL (ref 80.0–100.0)
Platelets: 118 K/uL — ABNORMAL LOW (ref 150–400)
RBC: 3.41 MIL/uL — ABNORMAL LOW (ref 3.87–5.11)
RDW: 20.2 % — ABNORMAL HIGH (ref 11.5–15.5)
WBC: 4.5 K/uL (ref 4.0–10.5)
nRBC: 0 % (ref 0.0–0.2)

## 2024-03-04 LAB — BASIC METABOLIC PANEL WITH GFR
Anion gap: 9 (ref 5–15)
BUN: 36 mg/dL — ABNORMAL HIGH (ref 6–20)
CO2: 23 mmol/L (ref 22–32)
Calcium: 8.3 mg/dL — ABNORMAL LOW (ref 8.9–10.3)
Chloride: 100 mmol/L (ref 98–111)
Creatinine, Ser: 5.72 mg/dL — ABNORMAL HIGH (ref 0.44–1.00)
GFR, Estimated: 8 mL/min — ABNORMAL LOW (ref >60)
Glucose, Bld: 85 mg/dL (ref 70–99)
Potassium: 3.7 mmol/L (ref 3.5–5.1)
Sodium: 132 mmol/L — ABNORMAL LOW (ref 135–145)

## 2024-03-04 MED ORDER — LIDOCAINE 5 % EX OINT: 1.0000 | TOPICAL_OINTMENT | Freq: Three times a day (TID) | CUTANEOUS | 0 refills | Status: AC | PRN

## 2024-03-04 NOTE — ED Triage Notes (Signed)
 First nurse note: Pt to ED via [facility] from [facility]. Pt abnormal viral herpes and possible UTI   154/87 68 HR  90% RA

## 2024-03-04 NOTE — ED Triage Notes (Signed)
 Pt comes viz EMS from [facility]  with 4 days of burning and pain and possible UIT. Pt states they sent off culture.

## 2024-03-04 NOTE — ED Notes (Signed)
 Attempted to call Primary RN to make aware pt relations was bringing pt to room

## 2024-03-04 NOTE — ED Notes (Addendum)
 This RN spoke with Reena Hedge, pts emergency contact, over the phone. This RN told Reena that pt was discharged without their MOST form. Reena stated she would call [facility] and let them know the form is here.

## 2024-03-04 NOTE — ED Notes (Signed)
 This RN spoke with Luke DEL, LPN, from [facility]. This RN told Luke that pt was discharged without their MOST form. Luke stated she would pass on the message and have someone come get the MOST form.

## 2024-03-04 NOTE — ED Provider Notes (Signed)
 [facility] Provider Note   Event Date/Time   First MD Initiated Contact with Patient 03/04/24 1356     (approximate) History  abnormal labs  HPI Tonya Myers is a 59 y.o. female with a past medical history of end-stage renal disease on dialysis, CLL in remission, degenerative disc disease, genital herpes simplex, and anxiety/depression who presents via EMS from [facility] with 4 days of of dysuria.  Patient is a poor historian stating that she feels like she has itching and burning as well as sharp pain in the vaginal region that is worse when she urinates.  In speaking with patient's nurse at [facility], patient had a urinalysis that was sent 3 days prior to arrival only showing elevation in white blood cells as well as a urine culture that has not shown any growth to date.  Patient was prescribed Bactrim  at an oncology visit 3 days prior to arrival and patient states I have been taking what ever they are giving me when asked if she has been adherent with her medications. ROS: Patient currently denies any vision changes, tinnitus, difficulty speaking, facial droop, sore throat, chest pain, shortness of breath, abdominal pain, nausea/vomiting/diarrhea, or weakness/numbness/paresthesias in any extremity   Physical Exam  Triage Vital Signs: ED Triage Vitals  Encounter Vitals Group     BP 03/04/24 1122 131/77     Girls Systolic BP Percentile --      Girls Diastolic BP Percentile --      Boys Systolic BP Percentile --      Boys Diastolic BP Percentile --      Pulse Rate 03/04/24 1122 72     Resp 03/04/24 1122 18     Temp 03/04/24 1122 98.5 F (36.9 C)     Temp src --      SpO2 03/04/24 1122 100 %     Weight 03/04/24 1122 194 lb 6.4 oz (88.2 kg)     Height 03/04/24 1122 6' (1.829 m)     Head Circumference --      [facility] Flow --      Pain Score 03/04/24 1121 9     Pain Loc --      Pain Education --      Exclude from Growth Chart --    Most  recent vital signs: Vitals:   03/04/24 1122 03/04/24 1443  BP: 131/77 119/78  Pulse: 72 60  Resp: 18 18  Temp: 98.5 F (36.9 C)   SpO2: 100% 100%   General: Awake, oriented x4. CV:  Good peripheral perfusion. Resp:  Normal effort. Abd:  No distention. GU:  Normal external female genitalia without any appreciable lesions and no tenderness to palpation Other:  Middle-aged overweight African-American female resting comfortably in no acute distress ED Results / Procedures / Treatments  Labs (all labs ordered are listed, but only abnormal results are displayed) Labs Reviewed  CBC - Abnormal; Notable for the following components:      Result Value   RBC 3.41 (*)    Hemoglobin 10.6 (*)    HCT 32.9 (*)    RDW 20.2 (*)    Platelets 118 (*)    All other components within normal limits  BASIC METABOLIC PANEL WITH GFR - Abnormal; Notable for the following components:   Sodium 132 (*)    BUN 36 (*)    Creatinine, Ser 5.72 (*)    Calcium  8.3 (*)    GFR, Estimated 8 (*)    All  other components within normal limits   PROCEDURES: Critical Care performed: No Procedures MEDICATIONS ORDERED IN ED: Medications - No data to display IMPRESSION / MDM / ASSESSMENT AND PLAN / ED COURSE  I reviewed the triage vital signs and the nursing notes.                             The patient is on the cardiac monitor to evaluate for evidence of arrhythmia and/or significant heart rate changes. Patient's presentation is most consistent with acute presentation with potential threat to life or bodily function. Patient is a 58 year old female with the above-stated past medical history the presents for dysuria over the past 4 days with a recent urinalysis showing elevation of white blood cell count as well as urine culture showing no growth at 3 days DDx: Herpes vaginalis, vaginal atrophy, yeast infection, abscess/cellulitis Plan: BMP, CBC  Laboratory evaluation at baseline.  Discussed with patient the  importance of follow-up with her primary care physician for further evaluation.  Patient agrees with plan for discharge at this time with outpatient follow-up.  Patient given strict return precautions and all questions answered prior to discharge  Dispo: Discharge home with PCP follow-up   FINAL CLINICAL IMPRESSION(S) / ED DIAGNOSES   Final diagnoses:  Dysuria  Vaginal discomfort   Rx / DC Orders   ED Discharge Orders          Ordered    lidocaine  (XYLOCAINE ) 5 % ointment  3 times daily PRN        03/04/24 1504           Note:  This document was prepared using Dragon voice recognition software and may include unintentional dictation errors.   Jossie Artist POUR, MD 03/04/24 [phone]

## 2024-04-10 ENCOUNTER — Ambulatory Visit: Admitting: Podiatry

## 2024-04-10 ENCOUNTER — Encounter: Payer: Self-pay | Admitting: Podiatry

## 2024-04-10 VITALS — Ht 72.0 in | Wt 194.4 lb

## 2024-04-10 DIAGNOSIS — E0843 Diabetes mellitus due to underlying condition with diabetic autonomic (poly)neuropathy: Secondary | ICD-10-CM | POA: Diagnosis not present

## 2024-04-10 DIAGNOSIS — I739 Peripheral vascular disease, unspecified: Secondary | ICD-10-CM

## 2024-04-10 MED ORDER — CLOTRIMAZOLE-BETAMETHASONE 1-0.05 % EX CREA: 1.0000 | TOPICAL_CREAM | Freq: Every day | CUTANEOUS | 2 refills | Status: DC

## 2024-04-10 NOTE — Progress Notes (Signed)
 Chief Complaint  Patient presents with   Foot Pain    Pt is here due to bilateral foot pain states this has been going for a while, also concern with the itching and burning to the feet, complains of discoloration of her toenails states they are turning black, pt is a diabetic and has neuropathy.    HPI: 58 y.o. female Hx T2DM presenting today for follow-up evaluation of her severe peripheral polyneuropathy to the bilateral feet.  This is been ongoing for few years now.  She takes gabapentin  with minimal relief.  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  Since the patient was last seen in the office she has had several episodes of falls.  She is very concerned because of her instability   Past Medical History:  Diagnosis Date   Acute hemorrhoid 03/11/2015   Anxiety    Arthritis    Asthma    Cancer (HCC)    Chronic back pain    CLL (chronic lymphocytic leukemia) (HCC)    Depression    Diabetes mellitus without complication (HCC)    Genital herpes    type 2   Hypertension    Hypothyroidism    Renal disorder    Vertigo     Past Surgical History:  Procedure Laterality Date   ABDOMINAL HYSTERECTOMY     BILATERAL SALPINGOOPHORECTOMY  2009   BREAST BIOPSY Right 05/17/2016   FIBROADENOMATOUS CHANGE AND SCLEROSING ADENOSIS WITH   COLONOSCOPY WITH PROPOFOL  N/A 06/16/2015   Procedure: COLONOSCOPY WITH PROPOFOL ;  Surgeon: Donnice Vaughn Manes, MD;  Location: [facility] ENDOSCOPY;  Service: Endoscopy;  Laterality: N/A;   DIALYSIS/PERMA CATHETER INSERTION N/A 12/17/2020   Procedure: DIALYSIS/PERMA CATHETER INSERTION;  Surgeon: Marea Selinda RAMAN, MD;  Location: [facility] INVASIVE CV LAB;  Service: Cardiovascular;  Laterality: N/A;   LAPAROSCOPIC SUPRACERVICAL HYSTERECTOMY  2009   due to leio   OOPHORECTOMY     TEMPORARY DIALYSIS CATHETER N/A 12/10/2020   Procedure: TEMPORARY DIALYSIS CATHETER;  Surgeon: Jama Cordella MATSU, MD;  Location: [facility] INVASIVE CV LAB;  Service: Cardiovascular;   Laterality: N/A;   THORACIC LAMINECTOMY FOR EPIDURAL ABSCESS Bilateral 06/17/2020   Procedure: THORACIC LAMINECTOMY FOR EPIDURAL ABSCESS;  Surgeon: Bluford Standing, MD;  Location: [facility] ORS;  Service: Neurosurgery;  Laterality: Bilateral;    Allergies  Allergen Reactions   Amoxapine     Other Reaction(s): Unknown   Latex Other (See Comments)   Ceftazidime     Other reaction(s): Confusion, Hallucination, Unknown Encephalopathy - improved after dialysis/some concern for cephalosporin neurotoxicity Tolerated without confusion 06/2021. Encephalopathy - improved after dialysis/some concern for cephalosporin neurotoxicity Tolerated without confusion 06/2021.    Iodinated Contrast Media Hives and Rash    Other Reaction(s): Hives   Penicillins     Other Reaction(s): Unknown     Physical Exam: General: The patient is alert and oriented x3 in no acute distress.  Dermatology: Skin is cool, dry and supple bilateral lower extremities.  Hyperkeratotic dystrophic nails noted 1-5 bilateral  Vascular: Palpable pedal pulses bilaterally. Capillary refill within normal limits.  No appreciable edema.  No erythema.  Neurological: Light touch and protective threshold diminished.  Severe paresthesia with pins-and-needles noted bilateral toes and feet  Musculoskeletal Exam: Patient is now nonambulatory presenting in a electric scooter today  Assessment/Plan of Care: 1.  Severe peripheral polyneuropathy bilateral feet  2.  History of spinal meningitis 2022 3.  Concern for peripheral arterial disease (PAD) bilateral lower extremities 4.  Diabetes mellitus  -Patient evaluated. -  Patient requesting a topical medication to help with some of her itching and burning painful sensation especially at night.  I do believe this may be due to ischemic compromise.  I did however send in a prescription for Lotrisone  cream apply nightly - The toes are cold to touch with a delayed capillary refill.  Concern for possible PAD  complicated by diabetic peripheral neuropathy -Order placed for ABIs bilateral lower extremities.  Vascular referral if abnormal -Return to clinic with me PRN   Thresa EMERSON Sar, DPM [facility]  Dr. Thresa EMERSON Sar, DPM    [address] [place], [place] [address]                Office [phone]  Fax [phone]

## 2024-04-15 ENCOUNTER — Emergency Department

## 2024-04-15 ENCOUNTER — Emergency Department
Admission: EM | Admit: 2024-04-15 | Discharge: 2024-04-15 | Disposition: A | Discharge status: Home / Self Care | Attending: Emergency Medicine | Admitting: Emergency Medicine

## 2024-04-15 ENCOUNTER — Other Ambulatory Visit: Payer: Self-pay

## 2024-04-15 DIAGNOSIS — K5641 Fecal impaction: Secondary | ICD-10-CM | POA: Insufficient documentation

## 2024-04-15 DIAGNOSIS — K5900 Constipation, unspecified: Secondary | ICD-10-CM | POA: Diagnosis present

## 2024-04-15 MED ORDER — LIDOCAINE VISCOUS HCL 2 % MT SOLN
15.0000 mL | Freq: Once | OROMUCOSAL | Status: AC
  Administered 2024-04-15: 15 mL via OROMUCOSAL
  Filled 2024-04-15: qty 15

## 2024-04-15 NOTE — ED Notes (Signed)
 Patient states she found a ride, pt female friend at bedside to provide transportation home.

## 2024-04-15 NOTE — ED Triage Notes (Signed)
 Pt presents for constipation x10 days. Tried multiple OTC laxatives and medications without relief. Endorsing nausea and vomiting. CBG 164. Hemodialysis pt (M, W, F)- Up to date.  Past Medical History:  Diagnosis Date   Acute hemorrhoid 03/11/2015   Anxiety    Arthritis    Asthma    Cancer (HCC)    Chronic back pain    CLL (chronic lymphocytic leukemia) (HCC)    Depression    Diabetes mellitus without complication (HCC)    Genital herpes    type 2   Hypertension    Hypothyroidism    Renal disorder    Vertigo

## 2024-04-15 NOTE — ED Notes (Signed)
 Waiting on [facility] to come for transport home per Alm it will be this afternoon before they can be picked up

## 2024-04-15 NOTE — ED Notes (Signed)
 This RN was present as chaperone for rectal disimpaction performed by MD. Pt tolerated well with mild discomfort.

## 2024-04-15 NOTE — ED Provider Notes (Signed)
 "  [facility] Provider Note    Event Date/Time   First MD Initiated Contact with Patient 04/15/24 [phone]     (approximate)   History   Constipation   HPI Tonya Myers is a 58 y.o. female whose medical history includes but is not limited to hemodialysis Mondays, Wednesdays, and Fridays.  She says that she has had constipation in the past but does not usually take anything on a daily basis.  She has been constipated for 7 to 10 days and has tried taking multiple stool softeners which have not helped.  She feels bloated with some abdominal pain and says she has had some nausea and vomiting as well.  She has remained compliant with her dialysis and is due not today but the day after.  She is having no shortness of breath.  No abdominal pain, just the swelling and occasional cramps.  She feels pressure in her rectum.     Physical Exam   Triage Vital Signs: ED Triage Vitals  Encounter Vitals Group     BP 04/15/24 0052 131/87     Girls Systolic BP Percentile --      Girls Diastolic BP Percentile --      Boys Systolic BP Percentile --      Boys Diastolic BP Percentile --      Pulse Rate 04/15/24 0052 78     Resp 04/15/24 0052 18     Temp 04/15/24 0052 98.1 F (36.7 C)     Temp Source 04/15/24 0052 Oral     SpO2 04/15/24 0052 98 %     Weight 04/15/24 0314 88.5 kg (195 lb)     Height 04/15/24 0314 1.829 m (6')     Head Circumference --      Peak Flow --      Pain Score 04/15/24 0056 8     Pain Loc --      Pain Education --      Exclude from Growth Chart --     Most recent vital signs: Vitals:   04/15/24 0052 04/15/24 0313  BP: 131/87 128/74  Pulse: 78 76  Resp: 18 18  Temp: 98.1 F (36.7 C) 98.4 F (36.9 C)  SpO2: 98% 99%    General: Awake, appears chronically ill but not in acute distress. CV:  Good peripheral perfusion.  Resp:  Normal effort. Speaking easily and comfortably, no accessory muscle usage nor intercostal retractions.    Abd:  No distention.  No tenderness to palpation.  Obese.  Soft abdomen. Other:  Easily palpable hard rectal ball upon digital rectal examination.  See procedure note for additional details.  ED chaperone present throughout exam.     ED Results / Procedures / Treatments   Labs (all labs ordered are listed, but only abnormal results are displayed) Labs Reviewed - No data to display   RADIOLOGY I independently viewed and interpreted the patient's abdominal x-ray and there is a large stool burden including a fecal ball in the rectum.  Confirmed by the radiologist.   PROCEDURES:  Critical Care performed: No  .Fecal disimpaction  Date/Time: 04/15/2024 5:08 AM  Performed by: Gordan Huxley, MD Authorized by: Gordan Huxley, MD  Consent: Verbal consent obtained Local anesthesia used: yes  Anesthesia: Local anesthesia used: yes Local Anesthetic: topical anesthetic (viscous lidocaine )  Sedation: Patient sedated: no  Patient tolerance: patient tolerated the procedure well with no immediate complications       IMPRESSION / MDM / ASSESSMENT AND  PLAN / ED COURSE  I reviewed the triage vital signs and the nursing notes.                              Differential diagnosis includes, but is not limited to, constipation, SBO, ileus, neoplasm.  Patient's presentation is most consistent with acute presentation with potential threat to life or bodily function.  Labs/studies ordered: Abdominal x-ray  Interventions/Medications given:  Medications  lidocaine  (XYLOCAINE ) 2 % viscous mouth solution 15 mL (has no administration in time range)    (Note:  hospital course my include additional interventions and/or labs/studies not listed above.)   Patient has a benign exam and obvious fecal ball on imaging.  Physical exam corroborates.  With her permission I proceeded with fecal disimpaction and had great success, with a large volume of stool removed from her rectum to where no  additional stool was palpable.  She still felt like she had some pressure but felt better.  I offered her to stay and use the bathroom here but she would rather go home which I think is very reasonable.  I gave her my usual constipation recommendations and close outpatient follow-up recommendations.  The patient's medical screening exam is reassuring with no indication of an emergent medical condition requiring hospitalization or additional evaluation at this point.  The patient is safe and appropriate for discharge and outpatient follow up.         FINAL CLINICAL IMPRESSION(S) / ED DIAGNOSES   Final diagnoses:  Constipation, unspecified constipation type  Fecal impaction in rectum [facility])     Rx / DC Orders   ED Discharge Orders     None        Note:  This document was prepared using Dragon voice recognition software and may include unintentional dictation errors.   Gordan Huxley, MD 04/15/24 [phone]  "

## 2024-04-15 NOTE — Discharge Instructions (Addendum)
 You were seen in the emergency department today for constipation.  We recommend that you use one or more of the following over-the-counter medications in the order described:   1)  Miralax (powder):  This medication works by drawing additional fluid into your intestines and helps to flush out your stool.  Mix the powder with water or juice according to label instructions.  Be sure to use the recommended amount of water or juice when you mix up the powder.  Plenty of fluids will help to prevent constipation. 2)  Colace (or Dulcolax) 100 mg:  This is a stool softener, and you may take it once or twice a day as needed. 3)  Senna tablets:  This is a bowel stimulant that will help "push" out your stool. It is the next step to add after you have tried a stool softener. 4)  Magnesium citrate: This is typically sold in a clear glass bottle also purchased without the need for prescription.  You can drink the bottle and it typically stimulates your bowels within a short period of time.  You may also want to consider using glycerin suppositories, which you insert into your rectum.  You hold it in place and is dissolves and softens your stool and stimulates your bowels.  You could also consider using an enema, which is also available over the counter.  Remember that narcotic pain medications are constipating, so avoid them or minimize their use.  Drink plenty of fluids.  Please return to the Emergency Department immediately if you develop new or worsening symptoms that concern you, such as (but not limited to) fever > 101 degrees, severe abdominal pain, or persistent vomiting.

## 2024-04-23 ENCOUNTER — Other Ambulatory Visit: Payer: Self-pay | Admitting: Oncology

## 2024-05-01 DIAGNOSIS — R300 Dysuria: Secondary | ICD-10-CM | POA: Insufficient documentation

## 2024-05-01 NOTE — Assessment & Plan Note (Addendum)
 Possible acute UTI versus chronic cystitis Incomplete bladder emptying with bilateral hydroureteronephrosis - via CT ESRD on HD Oliguric, volitional voids 1-2 / day  Catheterized today, 280cc retention.  Patient denied subjective fullness Cath UA - LE/nitrite+, >30 WBC  - Will begin empiric antibiotics for presumed UTI, limited options with allergies and ESRD: Will give PO 3g fosfomycin  x 1 - Urine culture - Incomplete emptying likely secondary to dysfunctional bladder, ESRD-related hypercontractility + significant constipation - Recommend aggressive bowel regimen with stool softeners/laxatives-consider GI referral if necessary - Bladder hygiene, timed voiding, double voiding and complete emptying - Educated that patient may very well incur urinary tract infections, bladder retention-even in the setting of ESRD on HD.  Her kidneys continue to produce urine, and may serve as a nidus for infection if chronically obstructed  - RTC in 6 weeks with RBUS to reevaluate hydronephrosis and bladder distention

## 2024-05-01 NOTE — Progress Notes (Signed)
 "  05/03/2024 9:39 AM   Tonya Myers 1965-06-11 [id number]   HPI: 59 y.o. female here for initial evaluation of dysuria  History of ESRD on HD MWF (started in 2022)  Oliguric, volitional voids 1-2 / day Significant constipation/impaction -required prior disimpaction procedures  -She only takes intermittent stool softeners-does not seem to have formal bowel regimen Ongoing dysuria x 2 months Symptomatic only with voiding, no suprapubic pain, denies flank pain Reported history of UTIs + sepsis, although I do not see positive cultures in chart  - Negative urine culture April 2025, - August 2023  No prior GU surgeries Denies [facility]    PMH: Past Medical History:  Diagnosis Date   Acute hemorrhoid 03/11/2015   Anxiety    Arthritis    Asthma    Cancer (HCC)    Chronic back pain    CLL (chronic lymphocytic leukemia) (HCC)    Depression    Diabetes mellitus without complication (HCC)    Genital herpes    type 2   Hypertension    Hypothyroidism    Renal disorder    Vertigo     Surgical History: Past Surgical History:  Procedure Laterality Date   ABDOMINAL HYSTERECTOMY     BILATERAL SALPINGOOPHORECTOMY  2009   BREAST BIOPSY Right 05/17/2016   FIBROADENOMATOUS CHANGE AND SCLEROSING ADENOSIS WITH   COLONOSCOPY WITH PROPOFOL  N/A 06/16/2015   Procedure: COLONOSCOPY WITH PROPOFOL ;  Surgeon: Donnice Vaughn Manes, MD;  Location: [facility] ENDOSCOPY;  Service: Endoscopy;  Laterality: N/A;   DIALYSIS/PERMA CATHETER INSERTION N/A 12/17/2020   Procedure: DIALYSIS/PERMA CATHETER INSERTION;  Surgeon: Marea Selinda RAMAN, MD;  Location: [facility] INVASIVE CV LAB;  Service: Cardiovascular;  Laterality: N/A;   LAPAROSCOPIC SUPRACERVICAL HYSTERECTOMY  2009   due to leio   OOPHORECTOMY     TEMPORARY DIALYSIS CATHETER N/A 12/10/2020   Procedure: TEMPORARY DIALYSIS CATHETER;  Surgeon: Jama Cordella MATSU, MD;  Location: [facility] INVASIVE CV LAB;  Service: Cardiovascular;  Laterality: N/A;   THORACIC  LAMINECTOMY FOR EPIDURAL ABSCESS Bilateral 06/17/2020   Procedure: THORACIC LAMINECTOMY FOR EPIDURAL ABSCESS;  Surgeon: Bluford Standing, MD;  Location: [facility] ORS;  Service: Neurosurgery;  Laterality: Bilateral;    Family History: Family History  Problem Relation Age of Onset   Cancer Paternal Aunt    Breast cancer Maternal Aunt 28    Social History:  reports that she has never smoked. She has never used smokeless tobacco. She reports that she does not currently use alcohol after a past usage of about 1.0 standard drink of alcohol per week. She reports that she does not use drugs.      Physical Exam: BP 107/72   Pulse 75   Ht 6' (1.829 m)   Wt 204 lb (92.5 kg)   SpO2 99%   BMI 27.67 kg/m    Constitutional:  Alert and oriented, No acute distress. Cardiovascular: No clubbing, cyanosis, or edema. Respiratory: Normal respiratory effort, no increased work of breathing. GI: Nondistended Skin: No rashes, bruises or suspicious lesions. Neurologic: Grossly intact, no focal deficits, moving all 4 extremities. Psychiatric: Normal mood and affect.  Laboratory Data: Creatinine, Ser 5.72 High  5.56 High  6.77 High  7.91 High  10.24 High  7.98 High  6.61 High     Pertinent Imaging: I have personally viewed and interpreted the CT A/P (Oct 2025) -moderately distended bladder, thin-walled with even contours.  Mild to moderate bilateral hydroureteronephrosis down to the level of the bladder.  Atrophic bilateral kidneys without renal masses.  IMPRESSION: 1. Moderate bilateral hydroureteronephrosis. In the absence of nephrolithiasis and obstructive ureteral calculi, this may be due to an ascending urinary tract infection given the bladder wall thickening. Correlation with urinalysis recommended. 2. Mild circumferential wall thickening throughout the rectosigmoid colon with presacral stranding present. While this could be due to underdistention, an infectious or inflammatory proctocolitis  could also have this appearance, in the correct clinical context. 3. Small right and trace volume left pleural effusions..    Assessment & Plan:    Dysuria Assessment & Plan: Possible acute UTI versus chronic cystitis Incomplete bladder emptying with bilateral hydroureteronephrosis - via CT ESRD on HD Oliguric, volitional voids 1-2 / day  Catheterized today, 280cc retention.  Patient denied subjective fullness Cath UA - LE/nitrite+, >30 WBC  - Will begin empiric antibiotics for presumed UTI, limited options with allergies and ESRD: Will give PO 3g fosfomycin  x 1 - Urine culture - Incomplete emptying likely secondary to dysfunctional bladder, ESRD-related hypercontractility + significant constipation - Recommend aggressive bowel regimen with stool softeners/laxatives-consider GI referral if necessary - Bladder hygiene, timed voiding, double voiding and complete emptying - Educated that patient may very well incur urinary tract infections, bladder retention-even in the setting of ESRD on HD.  Her kidneys continue to produce urine, and may serve as a nidus for infection if chronically obstructed  - RTC in 6 weeks with RBUS to reevaluate hydronephrosis and bladder distention  Orders: -     Urinalysis, Complete -     CULTURE, URINE COMPREHENSIVE  Urinary retention Assessment & Plan: Mild to moderate bladder retention  - ~280cc today   - bilateral hydroureteronephrosis  - ESRD on HD  - oliguric w/ 1-2 volitional voids daily   - Bladder hygiene/bowel regimen as above - RBUS in ~4-6 weeks to reassess  Orders: -     US  RENAL; Future  Other orders -     Fosfomycin  Tromethamine ; Take 3 g by mouth once for 1 dose.  Dispense: 3 g; Refill: 0      Penne Skye, MD 05/03/2024  [facility] [address], [address] [place], [place] [address] [phone] "

## 2024-05-03 ENCOUNTER — Ambulatory Visit (INDEPENDENT_AMBULATORY_CARE_PROVIDER_SITE_OTHER): Admitting: Urology

## 2024-05-03 VITALS — BP 107/72 | HR 75 | Ht 72.0 in | Wt 204.0 lb

## 2024-05-03 DIAGNOSIS — R339 Retention of urine, unspecified: Secondary | ICD-10-CM

## 2024-05-03 DIAGNOSIS — R300 Dysuria: Secondary | ICD-10-CM | POA: Diagnosis not present

## 2024-05-03 LAB — MICROSCOPIC EXAMINATION: WBC, UA: >30 /HPF — AB (ref 0–5)

## 2024-05-03 LAB — URINALYSIS, COMPLETE
Bilirubin, UA: NEGATIVE
Glucose, UA: NEGATIVE
Ketones, UA: NEGATIVE
Nitrite, UA: POSITIVE — AB
Specific Gravity, UA: 1.02 (ref 1.005–1.030)
Urobilinogen, Ur: 0.2 mg/dL (ref 0.2–1.0)
pH, UA: 7.5 (ref 5.0–7.5)

## 2024-05-03 MED ORDER — FOSFOMYCIN TROMETHAMINE 3 G PO PACK: 3.0000 g | PACK | Freq: Once | ORAL | 0 refills | Status: AC

## 2024-05-03 NOTE — Assessment & Plan Note (Signed)
 Mild to moderate bladder retention  - ~280cc today   - bilateral hydroureteronephrosis  - ESRD on HD  - oliguric w/ 1-2 volitional voids daily   - Bladder hygiene/bowel regimen as above - RBUS in ~4-6 weeks to reassess

## 2024-05-03 NOTE — Progress Notes (Deleted)
In and Out Catheterization  Patient is present today for a I & O catheterization due to ***. Patient was cleaned and prepped in a sterile fashion with betadine . A ***FR cath was inserted {dnt complications:20057} , ***ml of urine return was noted, urine was *** in color. A clean urine sample was collected for ***. Bladder was drained  And catheter was removed with out difficulty.    Performed by: ***  Follow up/ Additional notes: ***

## 2024-05-03 NOTE — Progress Notes (Signed)
 In and Out Catheterization  Patient is present today for a I & O catheterization due to Dysuria. Patient was cleaned and prepped in a sterile fashion with betadine . A 14FR cath was inserted no complications were noted , 280 ml of urine return was noted, urine was Brownish in color. A clean urine sample was collected for UA. Bladder was drained  And catheter was removed with out difficulty.    Performed by: Florrie Gobble CCMA Trainee  Follow up/ Additional notes: na

## 2024-05-03 NOTE — Patient Instructions (Signed)
 Please contact Central Scheduling to set up your ultrasound at [phone].

## 2024-05-07 LAB — CULTURE, URINE COMPREHENSIVE

## 2024-05-10 ENCOUNTER — Ambulatory Visit
Admission: RE | Admit: 2024-05-10 | Discharge: 2024-05-10 | Disposition: A | Discharge status: Home / Self Care | Source: Ambulatory Visit | Attending: Urology | Admitting: Urology

## 2024-05-10 DIAGNOSIS — R339 Retention of urine, unspecified: Secondary | ICD-10-CM | POA: Diagnosis present

## 2024-05-10 DIAGNOSIS — R9341 Abnormal radiologic findings on diagnostic imaging of renal pelvis, ureter, or bladder: Secondary | ICD-10-CM | POA: Insufficient documentation

## 2024-05-14 ENCOUNTER — Telehealth: Payer: Self-pay

## 2024-05-14 NOTE — Telephone Encounter (Signed)
 Patient called in wanting results from her Urine culture and Ultrasound.

## 2024-05-15 NOTE — Telephone Encounter (Signed)
 Called patient to discuss her ultrasound results which showed sediment, urine culture came back negative. I also informed her that per Dr. Georganne he would like to keep her upcoming appointment on 06/14/24 to discuss doing a Cytoscopy. Patient explained that she was still having burning during urination, cloudy urine, urine that has a foul odor and she didn't feel like the antibiotic helped her at all with her symptoms. She said she has been having this problem for 3 months now and she expressed how she was upset that she has not had relief. I offered her a appointment with Sam for a symptom recheck on 05/24/24 @10 :00am scheduling based off of patients availability. I informed patient to expect them to want another urine sample at her upcoming visit. Patient had no further questions or concerns.-Jaiya Mooradian,CMA.

## 2024-05-23 ENCOUNTER — Emergency Department

## 2024-05-23 ENCOUNTER — Emergency Department
Admission: EM | Admit: 2024-05-23 | Discharge: 2024-05-23 | Disposition: A | Discharge status: Home / Self Care | Attending: Emergency Medicine | Admitting: Emergency Medicine

## 2024-05-23 ENCOUNTER — Other Ambulatory Visit: Payer: Self-pay

## 2024-05-23 DIAGNOSIS — R200 Anesthesia of skin: Secondary | ICD-10-CM | POA: Diagnosis not present

## 2024-05-23 DIAGNOSIS — Z992 Dependence on renal dialysis: Secondary | ICD-10-CM | POA: Diagnosis not present

## 2024-05-23 DIAGNOSIS — Z7951 Long term (current) use of inhaled steroids: Secondary | ICD-10-CM | POA: Insufficient documentation

## 2024-05-23 DIAGNOSIS — E039 Hypothyroidism, unspecified: Secondary | ICD-10-CM | POA: Insufficient documentation

## 2024-05-23 DIAGNOSIS — J45909 Unspecified asthma, uncomplicated: Secondary | ICD-10-CM | POA: Insufficient documentation

## 2024-05-23 DIAGNOSIS — I69351 Hemiplegia and hemiparesis following cerebral infarction affecting right dominant side: Secondary | ICD-10-CM

## 2024-05-23 DIAGNOSIS — R531 Weakness: Secondary | ICD-10-CM

## 2024-05-23 DIAGNOSIS — E1122 Type 2 diabetes mellitus with diabetic chronic kidney disease: Secondary | ICD-10-CM | POA: Insufficient documentation

## 2024-05-23 DIAGNOSIS — I120 Hypertensive chronic kidney disease with stage 5 chronic kidney disease or end stage renal disease: Secondary | ICD-10-CM | POA: Diagnosis not present

## 2024-05-23 DIAGNOSIS — Z856 Personal history of leukemia: Secondary | ICD-10-CM | POA: Insufficient documentation

## 2024-05-23 DIAGNOSIS — R4781 Slurred speech: Secondary | ICD-10-CM | POA: Diagnosis not present

## 2024-05-23 DIAGNOSIS — Z79890 Hormone replacement therapy (postmenopausal): Secondary | ICD-10-CM | POA: Insufficient documentation

## 2024-05-23 DIAGNOSIS — N186 End stage renal disease: Secondary | ICD-10-CM | POA: Diagnosis not present

## 2024-05-23 DIAGNOSIS — Z79899 Other long term (current) drug therapy: Secondary | ICD-10-CM | POA: Insufficient documentation

## 2024-05-23 LAB — DIFFERENTIAL
Abs Immature Granulocytes: 0.05 10*3/uL (ref 0.00–0.07)
Basophils Absolute: 0 10*3/uL (ref 0.0–0.1)
Basophils Relative: 1 %
Eosinophils Absolute: 0.1 10*3/uL (ref 0.0–0.5)
Eosinophils Relative: 1 %
Immature Granulocytes: 1 %
Lymphocytes Relative: 18 %
Lymphs Abs: 1 10*3/uL (ref 0.7–4.0)
Monocytes Absolute: 0.3 10*3/uL (ref 0.1–1.0)
Monocytes Relative: 5 %
Neutro Abs: 4.2 10*3/uL (ref 1.7–7.7)
Neutrophils Relative %: 74 %

## 2024-05-23 LAB — CBG MONITORING, ED: Glucose-Capillary: 131 mg/dL — ABNORMAL HIGH (ref 70–99)

## 2024-05-23 LAB — COMPREHENSIVE METABOLIC PANEL WITH GFR
ALT: 12 U/L (ref 0–44)
AST: 21 U/L (ref 15–41)
Albumin: 4.1 g/dL (ref 3.5–5.0)
Alkaline Phosphatase: 95 U/L (ref 38–126)
Anion gap: 16 — ABNORMAL HIGH (ref 5–15)
BUN: 41 mg/dL — ABNORMAL HIGH (ref 6–20)
CO2: 27 mmol/L (ref 22–32)
Calcium: 9.6 mg/dL (ref 8.9–10.3)
Chloride: 97 mmol/L — ABNORMAL LOW (ref 98–111)
Creatinine, Ser: 4.86 mg/dL — ABNORMAL HIGH (ref 0.44–1.00)
GFR, Estimated: 10 mL/min — ABNORMAL LOW
Glucose, Bld: 153 mg/dL — ABNORMAL HIGH (ref 70–99)
Potassium: 4.2 mmol/L (ref 3.5–5.1)
Sodium: 140 mmol/L (ref 135–145)
Total Bilirubin: 0.7 mg/dL (ref 0.0–1.2)
Total Protein: 7.4 g/dL (ref 6.5–8.1)

## 2024-05-23 LAB — CBC
HCT: 31 % — ABNORMAL LOW (ref 36.0–46.0)
Hemoglobin: 9.8 g/dL — ABNORMAL LOW (ref 12.0–15.0)
MCH: 31.4 pg (ref 26.0–34.0)
MCHC: 31.6 g/dL (ref 30.0–36.0)
MCV: 99.4 fL (ref 80.0–100.0)
Platelets: 163 10*3/uL (ref 150–400)
RBC: 3.12 MIL/uL — ABNORMAL LOW (ref 3.87–5.11)
RDW: 16.7 % — ABNORMAL HIGH (ref 11.5–15.5)
WBC: 5.6 10*3/uL (ref 4.0–10.5)
nRBC: 0 % (ref 0.0–0.2)

## 2024-05-23 LAB — PROTIME-INR
INR: 1 (ref 0.8–1.2)
Prothrombin Time: 13.4 s (ref 11.4–15.2)

## 2024-05-23 LAB — APTT: aPTT: 33 s (ref 24–36)

## 2024-05-23 LAB — ETHANOL: Alcohol, Ethyl (B): <15 mg/dL

## 2024-05-23 MED ORDER — SODIUM CHLORIDE 0.9% FLUSH
3.0000 mL | Freq: Once | INTRAVENOUS | Status: AC
  Administered 2024-05-23: 3 mL via INTRAVENOUS

## 2024-05-23 MED ORDER — CARVEDILOL 6.25 MG PO TABS
12.5000 mg | ORAL_TABLET | Freq: Once | ORAL | Status: AC
  Administered 2024-05-23: 12.5 mg via ORAL
  Filled 2024-05-23: qty 2

## 2024-05-23 MED ORDER — AMLODIPINE BESYLATE 5 MG PO TABS
5.0000 mg | ORAL_TABLET | Freq: Once | ORAL | Status: AC
  Administered 2024-05-23: 5 mg via ORAL
  Filled 2024-05-23: qty 1

## 2024-05-23 MED ORDER — LORAZEPAM 2 MG/ML IJ SOLN
1.0000 mg | Freq: Once | INTRAMUSCULAR | Status: AC
  Administered 2024-05-23: 1 mg via INTRAVENOUS
  Filled 2024-05-23: qty 1

## 2024-05-23 MED ORDER — IRBESARTAN 150 MG PO TABS
300.0000 mg | ORAL_TABLET | Freq: Once | ORAL | Status: AC
  Administered 2024-05-23: 300 mg via ORAL
  Filled 2024-05-23: qty 2

## 2024-05-23 NOTE — Progress Notes (Signed)
 SPIRITUAL CARE AND COUNSELING CONSULT NOTE   VISIT SUMMARY Chaplain On-Call responded to Code Stroke notification at [phone]. The patient was already in the CT/MRI area for tests. Chaplain assured Staff of availability as needed.   SPIRITUAL ENCOUNTER                                                                                                                                                                      Type of Visit: Attempt (pt unavailable) Referral source: Code page (Code Stroke notification at [phone] Hours) Reason for visit: Code OnCall Visit: Yes   SPIRITUAL FRAMEWORK      GOALS       INTERVENTIONS        INTERVENTION OUTCOMES      SPIRITUAL CARE PLAN        If immediate needs arise, please contact [facility] 24 hour on call [phone]   Carlin KATHEE Lay, Chaplain  05/23/2024 9:39 AM

## 2024-05-23 NOTE — Consult Note (Signed)
 Requesting Physician: Claudene    Chief Complaint: Slurred speech, right sided numbness and weakness  I have been asked by Dr. Claudene to see this patient in consultation for code stroke.  HPI: Tonya Myers is an 59 y.o. 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 lupus, ESRD on dialysis, CLL, DM, HTN and hypothyroidism who reports that on yesterday noted slurred speech.  Then noted that she was experiencing numbness on the right side of her body which is new for her.  With continuation of her symptoms presented for evaluation today from dialysis.    Date last known well: 05/22/2024 Time last known well: 2000 tPA Given: No: Outside time window Thrombolytic Candidate: No target lesion identified  mRankin: 2  Neurologist arrival: 0907  Past Medical History:  Diagnosis Date   Acute hemorrhoid 03/11/2015   Anxiety    Arthritis    Asthma    Cancer (HCC)    Chronic back pain    CLL (chronic lymphocytic leukemia) (HCC)    Depression    Diabetes mellitus without complication (HCC)    Genital herpes    type 2   Hypertension    Hypothyroidism    Renal disorder    Vertigo     Past Surgical History:  Procedure Laterality Date   ABDOMINAL HYSTERECTOMY     BILATERAL SALPINGOOPHORECTOMY  2009   BREAST BIOPSY Right 05/17/2016   FIBROADENOMATOUS CHANGE AND SCLEROSING ADENOSIS WITH   COLONOSCOPY WITH PROPOFOL  N/A 06/16/2015   Procedure: COLONOSCOPY WITH PROPOFOL ;  Surgeon: Donnice Vaughn Manes, MD;  Location: [facility] ENDOSCOPY;  Service: Endoscopy;  Laterality: N/A;   DIALYSIS/PERMA CATHETER INSERTION N/A 12/17/2020   Procedure: DIALYSIS/PERMA CATHETER INSERTION;  Surgeon: Marea Selinda RAMAN, MD;  Location: [facility] INVASIVE CV LAB;  Service: Cardiovascular;  Laterality: N/A;   LAPAROSCOPIC SUPRACERVICAL HYSTERECTOMY  2009   due to leio   OOPHORECTOMY     TEMPORARY DIALYSIS CATHETER N/A 12/10/2020   Procedure: TEMPORARY DIALYSIS CATHETER;  Surgeon: Jama Cordella MATSU, MD;  Location: [facility] INVASIVE CV LAB;  Service: Cardiovascular;  Laterality: N/A;   THORACIC LAMINECTOMY FOR EPIDURAL ABSCESS Bilateral 06/17/2020   Procedure: THORACIC LAMINECTOMY FOR EPIDURAL ABSCESS;  Surgeon: Bluford Standing, MD;  Location: [facility] ORS;  Service: Neurosurgery;  Laterality: Bilateral;    Family History  Problem Relation Age of Onset   Cancer Paternal Aunt    Breast cancer Maternal Aunt 77   Social History:  reports that she has never smoked. She has never used smokeless tobacco. She reports that she does not currently use alcohol after a past usage of about 1.0 standard drink of alcohol per week. She reports that she does not use drugs.  Allergies: Allergies[1]  Medications:  Prior to Admission medications  Medication Sig Start Date End Date Taking? Authorizing Provider  acetaminophen  (TYLENOL ) 325 MG tablet Take 2 tablets (650 mg total) by mouth every 6 (six) hours as needed for mild pain (or Fever >/= 101). 07/30/20   Angiulli, Toribio PARAS, PA-C  albuterol  (VENTOLIN  HFA) 108 (90 Base) MCG/ACT inhaler Inhale 2 puffs into the lungs every 6 (six) hours as needed for wheezing or shortness of breath. 10/18/21   [provider]  amitriptyline  (ELAVIL ) 25 MG tablet Take 1 tablet (25 mg total) by mouth at bedtime. 12/06/23   Marcelino Nurse, MD  amLODipine  (NORVASC ) 5 MG tablet Take 1 tablet (5 mg total) by mouth daily. 12/13/23 12/12/24  Patel, Sona, MD  atorvastatin  (LIPITOR) 20  MG tablet Take 20 mg by mouth daily. 10/18/21   [provider]  azithromycin  (ZITHROMAX ) 250 MG tablet 2 pills for 2 days starting on 02/09/24 02/08/24   Gasper Devere ORN, PA-C  Blood Glucose Monitoring Suppl DEVI 1 each by Does not apply route 3 (three) times daily. May dispense any manufacturer covered by patient's insurance. 08/19/23   Von Bellis, MD  carvedilol  (COREG ) 12.5 MG tablet Take 12.5 mg by mouth. 02/03/24   [provider]  cetirizine (ZYRTEC) 10 MG tablet Take 1  tablet by mouth daily. 07/07/23   [provider]  Cholecalciferol (VITAMIN D-1000 MAX ST) 25 MCG (1000 UT) tablet Take 25 mcg by mouth. 02/03/24 02/02/25  [provider]  clotrimazole -betamethasone  (LOTRISONE ) cream Apply 1 Application topically daily. 04/10/24   Janit Thresa HERO, DPM  fluticasone  (FLONASE ) 50 MCG/ACT nasal spray Place 2 sprays into both nostrils daily.    [provider]  folic acid  (FOLVITE ) 1 MG tablet Take 1 tablet by mouth daily. 10/18/21   [provider]  Glucose Blood (BLOOD GLUCOSE TEST STRIPS) STRP 1 each by Does not apply route 3 (three) times daily. Use as directed to check blood sugar. May dispense any manufacturer covered by patient's insurance and fits patient's device. 08/19/23   Von Bellis, MD  guaiFENesin -dextromethorphan  (ROBITUSSIN DM) 100-10 MG/5ML syrup Take 5 mLs by mouth every 4 (four) hours as needed for cough. 12/13/23   Patel, Sona, MD  hydrOXYzine  (ATARAX ) 10 MG tablet Take 10 mg by mouth at bedtime.    [provider]  hydrOXYzine  (ATARAX ) 25 MG tablet Take 1 tablet (25 mg total) by mouth 3 (three) times daily as needed for itching. 08/19/23   Von Bellis, MD  Lancet Device MISC 1 each by Does not apply route 3 (three) times daily. May dispense any manufacturer covered by patient's insurance. 08/19/23   Von Bellis, MD  Lancets MISC 1 each by Does not apply route 3 (three) times daily. Use as directed to check blood sugar. May dispense any manufacturer covered by patient's insurance and fits patient's device. 08/19/23   Von Bellis, MD  levalbuterol (XOPENEX) 1.25 MG/3ML nebulizer solution Inhale 1.25 mg into the lungs. 02/03/24 02/02/25  [provider]  levothyroxine  (SYNTHROID ) 125 MCG tablet Take 125 mcg by mouth daily. Take 1 tablet daily except Sunday take 1 and 1/2 tablet (187.5 mg). 12/08/21   [provider]  lidocaine  (XYLOCAINE ) 5 % ointment Apply 1 Application topically 3 (three)  times daily as needed for moderate pain (pain score 4-6) or mild pain (pain score 1-3) (to vaginal area). 03/04/24   Bradler, Evan K, MD  lipase/protease/amylase (CREON ) 12000-38000 units CPEP capsule Take 1 capsule by mouth 3 (three) times daily with meals. 09/06/23   [provider]  melatonin 3 MG TABS tablet Take 6 mg by mouth. 02/03/24   [provider]  midodrine  (PROAMATINE ) 2.5 MG tablet Take 2.5 mg by mouth as directed. Only on dialysis days. (Monday Wednesday and Friday) 11/24/23   [provider]  multivitamin (RENA-VIT) TABS tablet Take 1 tablet by mouth at bedtime. 12/22/21   Sreenath, Sudheer B, MD  ondansetron  (ZOFRAN -ODT) 8 MG disintegrating tablet TAKE 1 TABLET BY MOUTH EVERY 8 HOURS AS NEEDED FOR NAUSEA OR VOMITING. 04/23/24   Jacobo Evalene PARAS, MD  pantoprazole  (PROTONIX ) 40 MG tablet Take 1 tablet (40 mg total) by mouth 2 (two) times daily for 28 days, THEN 1 tablet (40 mg total) daily. 08/19/23 06/11/26  Von,  Dileep, MD  sulfamethoxazole -trimethoprim  (BACTRIM  DS) 800-160 MG tablet Take 1 tablet by mouth 2 (two) times daily. 02/28/24   Finnegan, Timothy J, MD  telmisartan (MICARDIS) 80 MG tablet Take 80 mg by mouth. 04/25/24 04/25/25  [provider]  traMADol  (ULTRAM ) 50 MG tablet Take 1 tablet (50 mg total) by mouth at bedtime. 12/13/23   Patel, Sona, MD  valACYclovir  (VALTREX ) 500 MG tablet Take 500 mg by mouth as directed. Take 1 tablet daily on dialysis days.(Monday, Wednesday and Friday)    [provider]  zinc  sulfate 220 (50 Zn) MG capsule Take 1 capsule (220 mg total) by mouth daily. 12/23/21   Jhonny Calvin NOVAK, MD  budesonide-formoterol (SYMBICORT) 160-4.5 MCG/ACT inhaler Inhale into the lungs. 07/14/18 03/15/19  [provider]     ROS: History obtained from the patient  General ROS: negative for - chills, fatigue, fever, night sweats, weight gain or weight loss Psychological ROS: negative for - behavioral disorder,  hallucinations, memory difficulties, mood swings or suicidal ideation Ophthalmic ROS: negative for - blurry vision, double vision, eye pain or loss of vision ENT ROS: negative for - epistaxis, nasal discharge, oral lesions, sore throat, tinnitus or vertigo Allergy and Immunology ROS: negative for - hives or itchy/watery eyes Hematological and Lymphatic ROS: negative for - bleeding problems, bruising or swollen lymph nodes Endocrine ROS: negative for - galactorrhea, hair pattern changes, polydipsia/polyuria or temperature intolerance Respiratory ROS: negative for - cough, hemoptysis, shortness of breath or wheezing Cardiovascular ROS: negative for - chest pain, dyspnea on exertion, edema or irregular heartbeat Gastrointestinal ROS: negative for - abdominal pain, diarrhea, hematemesis, nausea/vomiting or stool incontinence Genito-Urinary ROS: negative for - dysuria, hematuria, incontinence or urinary frequency/urgency Musculoskeletal ROS: negative for - joint swelling or muscular weakness Neurological ROS: as noted in HPI Dermatological ROS: negative for rash and skin lesion changes   Physical Examination: Blood pressure (!) 175/99, pulse 89, resp. rate 14, SpO2 100%.  NIHSS components Score: Comment  1a Level of Conscious 0[x]  1[]  2[]  3[]      1b LOC Questions 0[x]  1[]  2[]       1c LOC Commands 0[x]  1[]  2[]       2 Best Gaze 0[x]  1[]  2[]       3 Visual 0[x]  1[]  2[]  3[]      4 Facial Palsy 0[]  1[x]  2[]  3[]      5a Motor Arm - left 0[x]  1[]  2[]  3[]  4[]  UN[]    5b Motor Arm - Right 0[]  1[]  2[]  3[x]  4[]  UN[]    6a Motor Leg - Left 0[]  1[]  2[]  3[x]  4[]  UN[]    6b Motor Leg - Right 0[]  1[]  2[]  3[x]  4[]  UN[]    7 Limb Ataxia 0[x]  1[]  2[]  3[]  UN[]     8 Sensory 0[]  1[x]  2[]  UN[]      9 Best Language 0[x]  1[]  2[]  3[]      10 Dysarthria 0[]  1[x]  2[]  UN[]      11 Extinct. and Inattention 0[x]  1[]  2[]       TOTAL: 12      Laboratory Studies:  Basic Metabolic Panel: No results for input(s): NA, K, CL,  CO2, GLUCOSE, BUN, CREATININE, CALCIUM , MG, PHOS in the last 168 hours.  Liver Function Tests: No results for input(s): AST, ALT, ALKPHOS, BILITOT, PROT, ALBUMIN  in the last 168 hours. No results for input(s): LIPASE, AMYLASE in the last 168 hours. No results for input(s): AMMONIA in the last 168 hours.  CBC: No results for input(s): WBC, NEUTROABS, HGB, HCT, MCV, PLT in the last 168  hours.  Cardiac Enzymes: No results for input(s): CKTOTAL, CKMB, CKMBINDEX, TROPONINI in the last 168 hours.  BNP: Invalid input(s): POCBNP  CBG: Recent Labs  Lab 05/23/24 0917  GLUCAP 131*    Microbiology: Results for orders placed or performed in visit on 05/03/24  Microscopic Examination     Status: Abnormal   Collection Time: 05/03/24  9:15 AM   Urine  Result Value Ref Range Status   WBC, UA >30 (A) 0 - 5 /hpf Final   RBC, Urine 11-30 (A) 0 - 2 /hpf Final   Epithelial Cells (non renal) 0-10 0 - 10 /hpf Final   Mucus, UA Present (A) Not Estab. Final   Bacteria, UA Many (A) None seen/Few Final  CULTURE, URINE COMPREHENSIVE     Status: None   Collection Time: 05/03/24  9:43 AM   Specimen: Urine   CU  Result Value Ref Range Status   Urine Culture, Comprehensive Final report  Final   Organism ID, Bacteria Comment  Final    Comment: Mixed urogenital flora Less than 10,000 colonies/mL     Coagulation Studies: No results for input(s): LABPROT, INR in the last 72 hours.  Urinalysis: No results for input(s): COLORURINE, LABSPEC, PHURINE, GLUCOSEU, HGBUR, BILIRUBINUR, KETONESUR, PROTEINUR, UROBILINOGEN, NITRITE, LEUKOCYTESUR in the last 168 hours.  Invalid input(s): APPERANCEUR  Lipid Panel:    Component Value Date/Time   CHOL 179 03/29/2023 0443   TRIG 315 (H) 12/29/2023 0331   HDL 48 03/29/2023 0443   CHOLHDL 3.7 03/29/2023 0443   VLDL 38 03/29/2023 0443   LDLCALC 93 03/29/2023 0443    HgbA1C:   Lab Results  Component Value Date   HGBA1C 6.9 (H) 12/30/2023    Urine Drug Screen:      Component Value Date/Time   LABOPIA NONE DETECTED 06/09/2020 2145   COCAINSCRNUR NONE DETECTED 06/09/2020 2145   LABBENZ NONE DETECTED 06/09/2020 2145   AMPHETMU NONE DETECTED 06/09/2020 2145   THCU NONE DETECTED 06/09/2020 2145   LABBARB NONE DETECTED 06/09/2020 2145    Alcohol Level: No results for input(s): ETH in the last 168 hours.  Imaging: CT HEAD CODE STROKE WO CONTRAST Result Date: 05/23/2024 EXAM: CT HEAD WITHOUT CONTRAST 05/23/2024 09:10:54 AM TECHNIQUE: CT of the head was performed without the administration of intravenous contrast. Automated exposure control, iterative reconstruction, and/or weight based adjustment of the mA/kV was utilized to reduce the radiation dose to as low as reasonably achievable. COMPARISON: CT of the head dated 12/24/2023. CLINICAL HISTORY: Neuro deficit, acute, stroke suspected. FINDINGS: BRAIN AND VENTRICLES: No acute hemorrhage. No evidence of acute infarct. No hydrocephalus. No extra-axial collection. No mass effect or midline shift. Mild cerebral white matter disease. [place] Stroke Program Early CT Score (ASPECTS) ----- Ganglionic (caudate, IC, lentiform nucleus, insula, M1-M3): 7 Supraganglionic (M4-M6): 3 Total: 10 Findings were communicated to Doctor Germaine at 09:16 AM 05/23/2024. ORBITS: No acute abnormality. SINUSES: No acute abnormality. SOFT TISSUES AND SKULL: No acute soft tissue abnormality. No skull fracture. IMPRESSION: 1. No acute intracranial abnormality related to the suspected stroke. 2. Mild cerebral white matter disease. 3. Findings communicated to Dr. Germaine at 09:16 AM on 05/23/24; Aspects: 10. Electronically signed by: [name] MD 05/23/2024 09:18 AM EST RP Workstation: [id number]    Assessment: 59 y.o. 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 lupus, ESRD on dialysis, CLL, DM, HTN and  hypothyroidism who reports that on yesterday noted slurred speech.  Then noted that she was  experiencing numbness on the right side of her body which is new for her.  With continuation of her symptoms presented for evaluation today from dialysis.  Head CT personally reviewed and shows no acute changes.  MRI/A of the brain performed as well as opposed to CTA due to contrast allergy.  MRI personally reviewed and shows no acute changes.  MRI shows no evidence of LVO. At this point do not suspect an acute ischemic event.  Possibly this represents more of metabolic etiology.  Improving exam (now able to lift RUE off the bed and able to lift both lower extremities an inch or two off of the bed).  Stroke Risk Factors - diabetes mellitus and hypertension  Plan: 1. Agree with medical work up 2. No further neurological intervention recommended at this time  Case discussed with Dr. Claudene Sonny Hock, MD Neurology  05/23/2024, 9:27 AM          [1]  Allergies Allergen Reactions   Amoxapine     Other Reaction(s): Unknown   Latex Other (See Comments)   Ceftazidime     Other reaction(s): Confusion, Hallucination, Unknown Encephalopathy - improved after dialysis/some concern for cephalosporin neurotoxicity Tolerated without confusion 06/2021. Encephalopathy - improved after dialysis/some concern for cephalosporin neurotoxicity Tolerated without confusion 06/2021.    Iodinated Contrast Media Hives and Rash    Other Reaction(s): Hives   Penicillins     Other Reaction(s): Unknown

## 2024-05-23 NOTE — ED Provider Notes (Signed)
 "  [facility] Provider Note    Event Date/Time   First MD Initiated Contact with Patient 05/23/24 [phone]     (approximate)   History   Weakness   HPI  Tonya Myers is a 59 y.o. female who presents to the ED for evaluation of Weakness   I reviewed PCP visit from yesterday.  Dialysis patient was requesting an electric wheelchair prescription.  Contraction of right hand and arm at baseline.  CLL  Patient presents to the ED via EMS from local hemodialysis center for evaluation of right sided weakness the patient reports has been present and progressive since 8 PM last night.  No falls, syncope or recent illnesses.    Finished a partial dialysis session today, 2 hours.  Arrives to the ED with one of the access needle still in place to her fistula on the right arm.  We take this down on arrival during my initial evaluation, apply pressure via fistula clamp and achieve hemostasis   Physical Exam   Triage Vital Signs: ED Triage Vitals  Encounter Vitals Group     BP      Girls Systolic BP Percentile      Girls Diastolic BP Percentile      Boys Systolic BP Percentile      Boys Diastolic BP Percentile      Pulse      Resp      Temp      Temp src      SpO2      Weight      Height      Head Circumference      Peak Flow      Pain Score      Pain Loc      Pain Education      Exclude from Growth Chart     Most recent vital signs: Vitals:   05/23/24 1100 05/23/24 1130  BP: (!) 179/87 (!) 180/86  Pulse: 81 84  Resp: 14 15  Temp:    SpO2: 100% 100%    General: Awake, no distress.  Tearful, conversant and fluent CV:  Good peripheral perfusion.  Resp:  Normal effort.  Abd:  No distention.  MSK:  No deformity noted.  Neuro:  No focal deficits appreciated.  Right-sided weakness, 1/5 strength, is noted Other:     ED Results / Procedures / Treatments   Labs (all labs ordered are listed, but only abnormal results are displayed) Labs  Reviewed  CBC - Abnormal; Notable for the following components:      Result Value   RBC 3.12 (*)    Hemoglobin 9.8 (*)    HCT 31.0 (*)    RDW 16.7 (*)    All other components within normal limits  COMPREHENSIVE METABOLIC PANEL WITH GFR - Abnormal; Notable for the following components:   Chloride 97 (*)    Glucose, Bld 153 (*)    BUN 41 (*)    Creatinine, Ser 4.86 (*)    GFR, Estimated 10 (*)    Anion gap 16 (*)    All other components within normal limits  CBG MONITORING, ED - Abnormal; Notable for the following components:   Glucose-Capillary 131 (*)    All other components within normal limits  PROTIME-INR  APTT  DIFFERENTIAL  ETHANOL  I-STAT CREATININE, ED    EKG Sinus rhythm with a rate of 90 bpm.  Normal axis and intervals.  No clear signs of acute ischemia.  Wandering  baseline clouds fine detail  RADIOLOGY CT head interpreted by me without evidence of acute intracranial pathology MRI/MRA without stroke or LVO  Official radiology report(s): MR ANGIO HEAD WO CONTRAST Addendum Date: 05/23/2024  ADDENDUM: An MRI of the brain without contrast was also performed. There is no restricted diffusion to indicate acute or recent infarction. There is mild periventricular white matter disease present. The study is degraded by patient motion. There is no evidence of hemorrhage, mass or hydrocephalus. There is mucosal disease present within the right frontal and ethmoid sinuses. ---------------------------------------------------- Electronically signed by: [name] MD 05/23/2024 11:34 AM EST RP Workstation: [id number]   Result Date: 05/23/2024 EXAM: MR Angiography Head without intravenous Contrast. 05/23/2024 09:59:18 AM TECHNIQUE: Magnetic resonance angiography images of the head without intravenous contrast. Multiplanar 2D and 3D reformatted images are provided for review. COMPARISON: CT of the head dated 05/23/2024. CLINICAL HISTORY: Neuro deficit, acute, stroke suspected.  FINDINGS: LIMITATIONS: The study is degraded by patient motion. ANTERIOR CIRCULATION: No significant stenosis of the internal carotid arteries. The right A1 segment is hypoplastic. The A2 segments appeared diminutive approximately, likely secondary to motion artifact. No significant stenosis of the middle cerebral arteries. No apparent aneurysm. No large vessel occlusion. POSTERIOR CIRCULATION: No significant stenosis of the posterior cerebral arteries. No significant stenosis of the basilar artery. No significant stenosis of the vertebral arteries. No aneurysm. IMPRESSION: 1. No apparent aneurysm or large vessel occlusion; no significant stenosis of the intracranial vasculature on this motion-degraded exam. 2. Hypoplastic right A1 segment; A2 segments appear diminutive, likely secondary to motion artifact. Electronically signed by: [name] MD 05/23/2024 10:42 AM EST RP Workstation: [id number]   MR BRAIN WO CONTRAST Addendum Date: 05/23/2024 ADDENDUM: An MRI of the brain without contrast was also performed. There is no restricted diffusion to indicate acute or recent infarction. There is mild periventricular white matter disease present. The study is degraded by patient motion. There is no evidence of hemorrhage, mass or hydrocephalus. There is mucosal disease present within the right frontal and ethmoid sinuses. ---------------------------------------------------- Electronically signed by: [name] MD 05/23/2024 11:34 AM EST RP Workstation: [id number]   Result Date: 05/23/2024 EXAM: MR Angiography Head without intravenous Contrast. 05/23/2024 09:59:18 AM TECHNIQUE: Magnetic resonance angiography images of the head without intravenous contrast. Multiplanar 2D and 3D reformatted images are provided for review. COMPARISON: CT of the head dated 05/23/2024. CLINICAL HISTORY: Neuro deficit, acute, stroke suspected. FINDINGS: LIMITATIONS: The study is degraded by patient motion. ANTERIOR CIRCULATION:  No significant stenosis of the internal carotid arteries. The right A1 segment is hypoplastic. The A2 segments appeared diminutive approximately, likely secondary to motion artifact. No significant stenosis of the middle cerebral arteries. No apparent aneurysm. No large vessel occlusion. POSTERIOR CIRCULATION: No significant stenosis of the posterior cerebral arteries. No significant stenosis of the basilar artery. No significant stenosis of the vertebral arteries. No aneurysm. IMPRESSION: 1. No apparent aneurysm or large vessel occlusion; no significant stenosis of the intracranial vasculature on this motion-degraded exam. 2. Hypoplastic right A1 segment; A2 segments appear diminutive, likely secondary to motion artifact. Electronically signed by: [name] MD 05/23/2024 10:42 AM EST RP Workstation: [id number]   CT HEAD CODE STROKE WO CONTRAST Result Date: 05/23/2024 EXAM: CT HEAD WITHOUT CONTRAST 05/23/2024 09:10:54 AM TECHNIQUE: CT of the head was performed without the administration of intravenous contrast. Automated exposure control, iterative reconstruction, and/or weight based adjustment of the mA/kV was utilized to reduce the radiation dose to as low as reasonably achievable. COMPARISON: CT of the head  dated 12/24/2023. CLINICAL HISTORY: Neuro deficit, acute, stroke suspected. FINDINGS: BRAIN AND VENTRICLES: No acute hemorrhage. No evidence of acute infarct. No hydrocephalus. No extra-axial collection. No mass effect or midline shift. Mild cerebral white matter disease. [place] Stroke Program Early CT Score (ASPECTS) ----- Ganglionic (caudate, IC, lentiform nucleus, insula, M1-M3): 7 Supraganglionic (M4-M6): 3 Total: 10 Findings were communicated to Doctor Germaine at 09:16 AM 05/23/2024. ORBITS: No acute abnormality. SINUSES: No acute abnormality. SOFT TISSUES AND SKULL: No acute soft tissue abnormality. No skull fracture. IMPRESSION: 1. No acute intracranial abnormality related to the suspected  stroke. 2. Mild cerebral white matter disease. 3. Findings communicated to Dr. Germaine at 09:16 AM on 05/23/24; Aspects: 10. Electronically signed by: [name] MD 05/23/2024 09:18 AM EST RP Workstation: [id number]    PROCEDURES and INTERVENTIONS:  .Critical Care  Performed by: Claudene Rover, MD Authorized by: Claudene Rover, MD   Critical care provider statement:    Critical care time (minutes):  30   Critical care time was exclusive of:  Separately billable procedures and treating other patients   Critical care was necessary to treat or prevent imminent or life-threatening deterioration of the following conditions:  CNS failure or compromise   Critical care was time spent personally by me on the following activities:  Development of treatment plan with patient or surrogate, discussions with consultants, evaluation of patient's response to treatment, examination of patient, ordering and review of laboratory studies, ordering and review of radiographic studies, ordering and performing treatments and interventions, pulse oximetry, re-evaluation of patient's condition and review of old charts .1-3 Lead EKG Interpretation  Performed by: Claudene Rover, MD Authorized by: Claudene Rover, MD     Interpretation: normal     ECG rate:  80   ECG rate assessment: normal     Rhythm: sinus rhythm     Ectopy: none     Conduction: normal     Medications  sodium chloride  flush (NS) 0.9 % injection 3 mL (3 mLs Intravenous Given 05/23/24 0930)  LORazepam  (ATIVAN ) injection 1 mg (1 mg Intravenous Given 05/23/24 0930)  irbesartan  (AVAPRO ) tablet 300 mg (300 mg Oral Given 05/23/24 1137)  amLODipine  (NORVASC ) tablet 5 mg (5 mg Oral Given 05/23/24 1136)  carvedilol  (COREG ) tablet 12.5 mg (12.5 mg Oral Given 05/23/24 1137)     IMPRESSION / MDM / ASSESSMENT AND PLAN / ED COURSE  I reviewed the triage vital signs and the nursing notes.  Differential diagnosis includes, but is not limited to, stroke, seizure,  ICH, acute stress reaction, metabolic encephalopathy, hyper uremia or hyperkalemia  {Patient presents with symptoms of an acute illness or injury that is potentially life-threatening.  Patient presents with about 12 hours of right sided weakness without signs of stroke or any clear acute pathology, symptoms resolving while in the ED and ultimately suitable for trial of outpatient management.  Right-sided weakness is present, due to the possibility of LVO, activate code stroke protocols.  Outside of any TNK window.  See separate neurology consultation.  No ICH on CT head, due to contrast allergy we obtain MRI/MRA without LVO or signs of stroke.  Stigmata of ESRD without emergent indication for hemodialysis, normal CBC with chronic normocytic anemia.  While I certainly considered admission for this patient after discussing with neurology on multiple occasions, patient's resolving symptoms and her desire to go home I think it is reasonable to pursue outpatient management at this point.  Clinical Course as of 05/23/24 1201  Wed May 23, 2024  0904  On arrival she has significant right-sided weakness and some difficulty with her speech seems more dysarthric than aphasic but she reports this is new since 8 PM last night.  Activate code stroke protocols [DS]  [phone] After CT and neurology evaluation, I go to CT scanner to place ultrasound IV.  Patient has contrast allergy, going straight to MRI/MRA and needs access. 20g left cephalic v [DS]  8983 I discuss with neuro, Dr. Germaine. No clear stroke on MRI, waiting on rads read [DS]  1023 Reassess, improving right sided weakness,  [DS]  1149 Reassessed again, female friend at the bedside.  Patient reports feeling much better and is requesting discharge.  She certainly has improved strength on the right side and she reports it feels normal.  She has next dialysis session on Friday.  We discussed ED return precautions [DS]    Clinical Course User Index [DS]  Claudene Rover, MD     FINAL CLINICAL IMPRESSION(S) / ED DIAGNOSES   Final diagnoses:  Right sided weakness     Rx / DC Orders   ED Discharge Orders     None        Note:  This document was prepared using Dragon voice recognition software and may include unintentional dictation errors.   Claudene Rover, MD 05/23/24 1209  "

## 2024-05-23 NOTE — Code Documentation (Signed)
 Stroke Response Nurse Documentation Code Documentation  Tonya Myers is a 58 y.o. female arriving to [facility] via [facility] on 05/23/2024 with past medical hx of HTN, DM, ESRD on dialysis. On {meds; anticoagulants:31417}. Code stroke was activated by ED.   Patient from *** where she was LKW at 2000 on 1/27 PM when she noticed her speech was slurred and right sided numbness and now complaining of right sided numbness. Pt reports    Stroke team at the bedside on patient arrival. Labs drawn and patient cleared for CT by Dr. Claudene. Patient to CT with team. NIHSS ***, see documentation for details and code stroke times. Patient with right facial droop, right arm weakness, bilateral leg weakness, right decreased sensation, and dysarthria  on exam. The following imaging was completed:  CT Head and MRI. Patient is not a candidate for IV Thrombolytic due to outside window, per MD. Patient {ACTION; IS/IS WNU:[id number]} a candidate for IR due to ***.   Care Plan: ***.   Process Delays Noted: ***  Bedside handoff with ED RN ***.    Tonya Myers  Stroke Response RN

## 2024-05-23 NOTE — ED Notes (Signed)
Code  stroke  called  to  [facility] 

## 2024-05-23 NOTE — ED Triage Notes (Signed)
 Pt from dialysis BIB [facility]. Pt has had increased weakness and slurred speech since yesterday. Pt was hoping dialysis would help, had two hours of treatment and 0.9 L fluid pulled off. Pt stating it is hard to talk. Known right side deficit EMS vitals: 90% RA (100% w/ 2L [place]) BP 180/100 HR 90 RR 22 CBG 142

## 2024-05-24 ENCOUNTER — Other Ambulatory Visit: Payer: Self-pay

## 2024-05-24 ENCOUNTER — Ambulatory Visit (INDEPENDENT_AMBULATORY_CARE_PROVIDER_SITE_OTHER): Admitting: Physician Assistant

## 2024-05-24 ENCOUNTER — Inpatient Hospital Stay
Admission: EM | Admit: 2024-05-24 | Discharge: 2024-06-01 | Disposition: A | Discharge status: Skilled Nursing Facility | Source: Home / Self Care | Attending: Hospitalist | Admitting: Hospitalist

## 2024-05-24 VITALS — BP 130/80 | HR 74 | Ht 69.0 in | Wt 204.0 lb

## 2024-05-24 DIAGNOSIS — R300 Dysuria: Secondary | ICD-10-CM

## 2024-05-24 DIAGNOSIS — E039 Hypothyroidism, unspecified: Secondary | ICD-10-CM | POA: Diagnosis present

## 2024-05-24 DIAGNOSIS — Z992 Dependence on renal dialysis: Secondary | ICD-10-CM

## 2024-05-24 DIAGNOSIS — E875 Hyperkalemia: Secondary | ICD-10-CM | POA: Diagnosis present

## 2024-05-24 DIAGNOSIS — R93429 Abnormal radiologic findings on diagnostic imaging of unspecified kidney: Secondary | ICD-10-CM

## 2024-05-24 DIAGNOSIS — R531 Weakness: Principal | ICD-10-CM

## 2024-05-24 DIAGNOSIS — K219 Gastro-esophageal reflux disease without esophagitis: Secondary | ICD-10-CM | POA: Diagnosis present

## 2024-05-24 DIAGNOSIS — E785 Hyperlipidemia, unspecified: Secondary | ICD-10-CM | POA: Diagnosis present

## 2024-05-24 DIAGNOSIS — N390 Urinary tract infection, site not specified: Secondary | ICD-10-CM

## 2024-05-24 DIAGNOSIS — I160 Hypertensive urgency: Secondary | ICD-10-CM

## 2024-05-24 LAB — URINALYSIS, COMPLETE
Bilirubin, UA: NEGATIVE
Glucose, UA: NEGATIVE
Nitrite, UA: NEGATIVE
Specific Gravity, UA: 1.02 (ref 1.005–1.030)
Urobilinogen, Ur: 0.2 mg/dL (ref 0.2–1.0)
pH, UA: 7.5 (ref 5.0–7.5)

## 2024-05-24 LAB — MICROSCOPIC EXAMINATION: WBC, UA: >30 /HPF — AB (ref 0–5)

## 2024-05-24 LAB — CBC
HCT: 33.4 % — ABNORMAL LOW (ref 36.0–46.0)
Hemoglobin: 9.9 g/dL — ABNORMAL LOW (ref 12.0–15.0)
MCH: 31 pg (ref 26.0–34.0)
MCHC: 29.6 g/dL — ABNORMAL LOW (ref 30.0–36.0)
MCV: 104.7 fL — ABNORMAL HIGH (ref 80.0–100.0)
Platelets: 178 10*3/uL (ref 150–400)
RBC: 3.19 MIL/uL — ABNORMAL LOW (ref 3.87–5.11)
RDW: 17.1 % — ABNORMAL HIGH (ref 11.5–15.5)
WBC: 4.5 10*3/uL (ref 4.0–10.5)
nRBC: 0 % (ref 0.0–0.2)
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
## 2024-05-24 NOTE — ED Triage Notes (Addendum)
 Pt comes with right sided weakness that has been going on for month. Pt just seen here for same yesterday and all scans negative. Pt has been home for month and unable to take care of self.   Did have dialysis yesterday but only 2 hrs completed.   Pt wishes for no blood draws at this time.

## 2024-05-24 NOTE — Patient Instructions (Signed)

## 2024-05-24 NOTE — Progress Notes (Signed)
 "  05/24/2024 10:25 AM   Tonya Myers 1966-03-21 [id number]  CC: Chief Complaint  Patient presents with   Dysuria   HPI: Tonya Myers is a 59 y.o. 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  She saw Dr. Georganne on 05/03/2024 as a new patient for evaluation of 2 months of dysuria.  She was treated with fosfomycin  with plans for follow-up in 4 to 6 weeks with renal ultrasound prior.  Urine culture grew mixed urogenital flora.  Today she reports no change in her symptoms with fosfomycin .  She is having ongoing urethral pain with voiding.  She denies vaginal dryness or vestibular irritation and tolerates perineal hygiene without difficulty.  She has not been sexually active in 10+ years.  Renal ultrasound dated 05/10/2024 showed no hydronephrosis, however there is an irregular bladder wall thickening suspicious for cystitis, debris, or neoplasm.  In-office cath UA today positive for 3+ protein, 3+ blood, trace ketones, and 3+ leukocytes; urine microscopy with >30 WBCs/HPF, 11-30 RBCs/HPF, and many bacteria.  PMH: Past Medical History:  Diagnosis Date   Acute hemorrhoid 03/11/2015   Anxiety    Arthritis    Asthma    Cancer (HCC)    Chronic back pain    CLL (chronic lymphocytic leukemia) (HCC)    Depression    Diabetes mellitus without complication (HCC)    Genital herpes    type 2   Hypertension    Hypothyroidism    Renal disorder    Vertigo     Surgical History: Past Surgical History:  Procedure Laterality Date   ABDOMINAL HYSTERECTOMY     BILATERAL SALPINGOOPHORECTOMY  2009   BREAST BIOPSY Right 05/17/2016   FIBROADENOMATOUS CHANGE AND SCLEROSING ADENOSIS WITH   COLONOSCOPY WITH PROPOFOL  N/A 06/16/2015   Procedure: COLONOSCOPY WITH PROPOFOL ;  Surgeon: Donnice Vaughn Manes, MD;  Location: [facility] ENDOSCOPY;   Service: Endoscopy;  Laterality: N/A;   DIALYSIS/PERMA CATHETER INSERTION N/A 12/17/2020   Procedure: DIALYSIS/PERMA CATHETER INSERTION;  Surgeon: Marea Selinda RAMAN, MD;  Location: [facility] INVASIVE CV LAB;  Service: Cardiovascular;  Laterality: N/A;   LAPAROSCOPIC SUPRACERVICAL HYSTERECTOMY  2009   due to leio   OOPHORECTOMY     TEMPORARY DIALYSIS CATHETER N/A 12/10/2020   Procedure: TEMPORARY DIALYSIS CATHETER;  Surgeon: Jama Cordella MATSU, MD;  Location: [facility] INVASIVE CV LAB;  Service: Cardiovascular;  Laterality: N/A;   THORACIC LAMINECTOMY FOR EPIDURAL ABSCESS Bilateral 06/17/2020   Procedure: THORACIC LAMINECTOMY FOR EPIDURAL ABSCESS;  Surgeon: Bluford Standing, MD;  Location: [facility] ORS;  Service: Neurosurgery;  Laterality: Bilateral;    Home Medications:  Allergies as of 05/24/2024       Reactions   Amoxapine    Other Reaction(s): Unknown   Latex Other (See Comments)   Ceftazidime    Other reaction(s): Confusion, Hallucination, Unknown Encephalopathy - improved after dialysis/some concern for cephalosporin neurotoxicity Tolerated without confusion 06/2021. Encephalopathy - improved after dialysis/some concern for cephalosporin neurotoxicity Tolerated without confusion 06/2021.   Iodinated Contrast Media Hives, Rash   Other Reaction(s): Hives   Penicillins    Other Reaction(s): Unknown        Medication List        Accurate as of May 24, 2024 10:25 AM. If you have any questions, ask your nurse or doctor.          STOP taking these medications  sulfamethoxazole -trimethoprim  800-160 MG tablet Commonly known as: BACTRIM  DS       TAKE these medications    acetaminophen  325 MG tablet Commonly known as: TYLENOL  Take 2 tablets (650 mg total) by mouth every 6 (six) hours as needed for mild pain (or Fever >/= 101).   albuterol  108 (90 Base) MCG/ACT inhaler Commonly known as: VENTOLIN  HFA Inhale 2 puffs into the lungs every 6 (six) hours as needed for wheezing or shortness of  breath.   amitriptyline  25 MG tablet Commonly known as: ELAVIL  Take 1 tablet (25 mg total) by mouth at bedtime.   amLODipine  5 MG tablet Commonly known as: NORVASC  Take 1 tablet (5 mg total) by mouth daily.   atorvastatin  20 MG tablet Commonly known as: LIPITOR Take 20 mg by mouth daily.   azithromycin  250 MG tablet Commonly known as: Zithromax  2 pills for 2 days starting on 02/09/24   Blood Glucose Monitoring Suppl Devi 1 each by Does not apply route 3 (three) times daily. May dispense any manufacturer covered by patient's insurance.   BLOOD GLUCOSE TEST STRIPS Strp 1 each by Does not apply route 3 (three) times daily. Use as directed to check blood sugar. May dispense any manufacturer covered by patient's insurance and fits patient's device.   carvedilol  12.5 MG tablet Commonly known as: COREG  Take 12.5 mg by mouth.   cetirizine 10 MG tablet Commonly known as: ZYRTEC Take 1 tablet by mouth daily.   clotrimazole -betamethasone  cream Commonly known as: LOTRISONE  Apply 1 Application topically daily.   Creon  12000-38000 units Cpep capsule Generic drug: lipase/protease/amylase Take 1 capsule by mouth 3 (three) times daily with meals.   fluticasone  50 MCG/ACT nasal spray Commonly known as: FLONASE  Place 2 sprays into both nostrils daily.   folic acid  1 MG tablet Commonly known as: FOLVITE  Take 1 tablet by mouth daily.   guaiFENesin -dextromethorphan  100-10 MG/5ML syrup Commonly known as: ROBITUSSIN DM Take 5 mLs by mouth every 4 (four) hours as needed for cough.   hydrOXYzine  10 MG tablet Commonly known as: ATARAX  Take 10 mg by mouth at bedtime.   hydrOXYzine  25 MG tablet Commonly known as: ATARAX  Take 1 tablet (25 mg total) by mouth 3 (three) times daily as needed for itching.   Lancet Device Misc 1 each by Does not apply route 3 (three) times daily. May dispense any manufacturer covered by patient's insurance.   Lancets Misc 1 each by Does not apply route 3  (three) times daily. Use as directed to check blood sugar. May dispense any manufacturer covered by patient's insurance and fits patient's device.   levalbuterol 1.25 MG/3ML nebulizer solution Commonly known as: XOPENEX Inhale 1.25 mg into the lungs.   levothyroxine  125 MCG tablet Commonly known as: SYNTHROID  Take 125 mcg by mouth daily. Take 1 tablet daily except Sunday take 1 and 1/2 tablet (187.5 mg).   lidocaine  5 % ointment Commonly known as: XYLOCAINE  Apply 1 Application topically 3 (three) times daily as needed for moderate pain (pain score 4-6) or mild pain (pain score 1-3) (to vaginal area).   melatonin 3 MG Tabs tablet Take 6 mg by mouth.   midodrine  2.5 MG tablet Commonly known as: PROAMATINE  Take 2.5 mg by mouth as directed. Only on dialysis days. (Monday Wednesday and Friday)   multivitamin Tabs tablet Take 1 tablet by mouth at bedtime.   ondansetron  8 MG disintegrating tablet Commonly known as: ZOFRAN -ODT TAKE 1 TABLET BY MOUTH EVERY 8 HOURS AS NEEDED FOR NAUSEA OR VOMITING.  pantoprazole  40 MG tablet Commonly known as: Protonix  Take 1 tablet (40 mg total) by mouth 2 (two) times daily for 28 days, THEN 1 tablet (40 mg total) daily. Start taking on: August 19, 2023   telmisartan 80 MG tablet Commonly known as: MICARDIS Take 80 mg by mouth.   traMADol  50 MG tablet Commonly known as: ULTRAM  Take 1 tablet (50 mg total) by mouth at bedtime.   valACYclovir  500 MG tablet Commonly known as: VALTREX  Take 500 mg by mouth as directed. Take 1 tablet daily on dialysis days.(Monday, Wednesday and Friday)   Vitamin D-1000 Max St 25 MCG (1000 UT) tablet Generic drug: Cholecalciferol Take 25 mcg by mouth.   zinc  sulfate (50mg  elemental zinc ) 220 (50 Zn) MG capsule Take 1 capsule (220 mg total) by mouth daily.        Allergies:  Allergies[1]  Family History: Family History  Problem Relation Age of Onset   Cancer Paternal Aunt    Breast cancer Maternal Aunt  36    Social History:   reports that she has never smoked. She has never used smokeless tobacco. She reports that she does not currently use alcohol after a past usage of about 1.0 standard drink of alcohol per week. She reports that she does not use drugs.  Physical Exam: BP 130/80   Pulse 74   Ht 5' 9 (1.753 m)   Wt 204 lb (92.5 kg)   BMI 30.13 kg/m   Constitutional:  Alert and oriented, no acute distress, nontoxic appearing HEENT: [place], AT Cardiovascular: No clubbing, cyanosis, or edema Respiratory: Normal respiratory effort, no increased work of breathing Skin: No rashes, bruises or suspicious lesions Neurologic: Grossly intact, no focal deficits, moving all 4 extremities Psychiatric: Normal mood and affect  Laboratory Data: See epic  Assessment & Plan:   1. Dysuria (Primary) Unchanged urethral pain with voiding, UA appears stable.  Will send for Vikor PCR culture and treat as indicated. - Urinalysis, Complete  2. Abnormal renal ultrasound Abnormal bladder wall thickening on renal ultrasound without hydronephrosis.  I recommended cystoscopy on follow-up with Dr. Georganne and she agreed.  Return in about 2 weeks (around 06/07/2024) for Cystoscopy with Dr. Georganne.  Lucie Hones, PA-C  [facility]  [address], [address] [place], [place] [address] [phone]      [1]  Allergies Allergen Reactions   Amoxapine     Other Reaction(s): Unknown   Latex Other (See Comments)   Ceftazidime     Other reaction(s): Confusion, Hallucination, Unknown Encephalopathy - improved after dialysis/some concern for cephalosporin neurotoxicity Tolerated without confusion 06/2021. Encephalopathy - improved after dialysis/some concern for cephalosporin neurotoxicity Tolerated without confusion 06/2021.    Iodinated Contrast Media Hives and Rash    Other Reaction(s): Hives   Penicillins     Other Reaction(s): Unknown   "

## 2024-05-24 NOTE — Progress Notes (Signed)
 In and Out Catheterization  Patient is present today for a I & O catheterization due to infection . Patient was cleaned and prepped in a sterile fashion with betadine . A 14FR cath was inserted no complications were noted , 30ml of urine return was noted, urine was creamy yellow  in color. A clean urine sample was collected for urine sample. Bladder was drained  And catheter was removed with out difficulty.    Performed by: Harlene Franks CMA

## 2024-05-25 ENCOUNTER — Observation Stay

## 2024-05-25 ENCOUNTER — Other Ambulatory Visit: Payer: Self-pay

## 2024-05-25 DIAGNOSIS — I69351 Hemiplegia and hemiparesis following cerebral infarction affecting right dominant side: Secondary | ICD-10-CM

## 2024-05-25 DIAGNOSIS — R531 Weakness: Secondary | ICD-10-CM

## 2024-05-25 DIAGNOSIS — E875 Hyperkalemia: Secondary | ICD-10-CM | POA: Diagnosis not present

## 2024-05-25 DIAGNOSIS — F410 Panic disorder [episodic paroxysmal anxiety] without agoraphobia: Secondary | ICD-10-CM | POA: Diagnosis not present

## 2024-05-25 DIAGNOSIS — Z992 Dependence on renal dialysis: Secondary | ICD-10-CM

## 2024-05-25 DIAGNOSIS — I160 Hypertensive urgency: Secondary | ICD-10-CM

## 2024-05-25 DIAGNOSIS — E785 Hyperlipidemia, unspecified: Secondary | ICD-10-CM | POA: Diagnosis not present

## 2024-05-25 DIAGNOSIS — E039 Hypothyroidism, unspecified: Secondary | ICD-10-CM | POA: Diagnosis not present

## 2024-05-25 DIAGNOSIS — N186 End stage renal disease: Secondary | ICD-10-CM

## 2024-05-25 DIAGNOSIS — N390 Urinary tract infection, site not specified: Secondary | ICD-10-CM

## 2024-05-25 LAB — CBC
HCT: 28.7 % — ABNORMAL LOW (ref 36.0–46.0)
HCT: 33 % — ABNORMAL LOW (ref 36.0–46.0)
Hemoglobin: 9.4 g/dL — ABNORMAL LOW (ref 12.0–15.0)
Hemoglobin: 11 g/dL — ABNORMAL LOW (ref 12.0–15.0)
MCH: 33 pg (ref 26.0–34.0)
MCH: 32.6 pg (ref 26.0–34.0)
MCHC: 32.8 g/dL (ref 30.0–36.0)
MCHC: 33.3 g/dL (ref 30.0–36.0)
MCV: 100.7 fL — ABNORMAL HIGH (ref 80.0–100.0)
MCV: 97.9 fL (ref 80.0–100.0)
Platelets: 154 10*3/uL (ref 150–400)
Platelets: 174 10*3/uL (ref 150–400)
RBC: 2.85 MIL/uL — ABNORMAL LOW (ref 3.87–5.11)
RBC: 3.37 MIL/uL — ABNORMAL LOW (ref 3.87–5.11)
RDW: 17 % — ABNORMAL HIGH (ref 11.5–15.5)
RDW: 17.1 % — ABNORMAL HIGH (ref 11.5–15.5)
WBC: 4.7 10*3/uL (ref 4.0–10.5)
WBC: 6.2 10*3/uL (ref 4.0–10.5)
nRBC: 0 % (ref 0.0–0.2)
nRBC: 0.6 % — ABNORMAL HIGH (ref 0.0–0.2)

## 2024-05-25 LAB — RENAL FUNCTION PANEL
Albumin: 3.7 g/dL (ref 3.5–5.0)
Anion gap: 17 — ABNORMAL HIGH (ref 5–15)
BUN: 72 mg/dL — ABNORMAL HIGH (ref 6–20)
CO2: 22 mmol/L (ref 22–32)
Calcium: 9.7 mg/dL (ref 8.9–10.3)
Chloride: 99 mmol/L (ref 98–111)
Creatinine, Ser: 8.27 mg/dL — ABNORMAL HIGH (ref 0.44–1.00)
GFR, Estimated: 5 mL/min — ABNORMAL LOW
Glucose, Bld: 106 mg/dL — ABNORMAL HIGH (ref 70–99)
Phosphorus: 6.1 mg/dL — ABNORMAL HIGH (ref 2.5–4.6)
Potassium: 5.6 mmol/L — ABNORMAL HIGH (ref 3.5–5.1)
Sodium: 138 mmol/L (ref 135–145)

## 2024-05-25 LAB — BASIC METABOLIC PANEL WITH GFR
Anion gap: 18 — ABNORMAL HIGH (ref 5–15)
BUN: 66 mg/dL — ABNORMAL HIGH (ref 6–20)
CO2: 23 mmol/L (ref 22–32)
Calcium: 9.8 mg/dL (ref 8.9–10.3)
Chloride: 100 mmol/L (ref 98–111)
Creatinine, Ser: 7.61 mg/dL — ABNORMAL HIGH (ref 0.44–1.00)
GFR, Estimated: 6 mL/min — ABNORMAL LOW
Glucose, Bld: 104 mg/dL — ABNORMAL HIGH (ref 70–99)
Potassium: 6.2 mmol/L — ABNORMAL HIGH (ref 3.5–5.1)
Sodium: 141 mmol/L (ref 135–145)

## 2024-05-25 LAB — GLUCOSE, CAPILLARY
Glucose-Capillary: 52 mg/dL — ABNORMAL LOW (ref 70–99)
Glucose-Capillary: 66 mg/dL — ABNORMAL LOW (ref 70–99)
Glucose-Capillary: 66 mg/dL — ABNORMAL LOW (ref 70–99)
Glucose-Capillary: 153 mg/dL — ABNORMAL HIGH (ref 70–99)
Glucose-Capillary: 115 mg/dL — ABNORMAL HIGH (ref 70–99)
Glucose-Capillary: 140 mg/dL — ABNORMAL HIGH (ref 70–99)

## 2024-05-25 LAB — CBG MONITORING, ED: Glucose-Capillary: 103 mg/dL — ABNORMAL HIGH (ref 70–99)

## 2024-05-25 LAB — HIV ANTIBODY (ROUTINE TESTING W REFLEX): HIV Screen 4th Generation wRfx: NONREACTIVE

## 2024-05-25 LAB — POTASSIUM: Potassium: 4.8 mmol/L (ref 3.5–5.1)

## 2024-05-25 MED ORDER — HYDROXYZINE HCL 10 MG PO TABS
10.0000 mg | ORAL_TABLET | Freq: Every day | ORAL | Status: DC
  Administered 2024-05-25 – 2024-05-31 (×8): 10 mg via ORAL
  Filled 2024-05-25 (×9): qty 1

## 2024-05-25 MED ORDER — LABETALOL HCL 5 MG/ML IV SOLN: 20.0000 mg | INTRAVENOUS | Status: DC | PRN

## 2024-05-25 MED ORDER — INSULIN ASPART 100 UNIT/ML IV SOLN
5.0000 [IU] | Freq: Once | INTRAVENOUS | Status: AC
  Administered 2024-05-25: 5 [IU] via INTRAVENOUS
  Filled 2024-05-25: qty 5

## 2024-05-25 MED ORDER — ONDANSETRON HCL 4 MG/2ML IJ SOLN: 4.0000 mg | Freq: Four times a day (QID) | INTRAMUSCULAR | Status: DC | PRN

## 2024-05-25 MED ORDER — HYDRALAZINE HCL 20 MG/ML IJ SOLN
10.0000 mg | Freq: Four times a day (QID) | INTRAMUSCULAR | Status: DC | PRN
  Administered 2024-05-26 – 2024-05-29 (×4): 10 mg via INTRAVENOUS
  Filled 2024-05-25 (×6): qty 1

## 2024-05-25 MED ORDER — SODIUM CHLORIDE 0.9 % IV SOLN
1.0000 g | Freq: Once | INTRAVENOUS | Status: AC
  Administered 2024-05-25: 1 g via INTRAVENOUS
  Filled 2024-05-25: qty 1

## 2024-05-25 MED ORDER — CALCIUM GLUCONATE-NACL 1-0.675 GM/50ML-% IV SOLN: 1.0000 g | Freq: Once | INTRAVENOUS | Status: DC

## 2024-05-25 MED ORDER — LORAZEPAM 2 MG/ML IJ SOLN
0.5000 mg | Freq: Once | INTRAMUSCULAR | Status: DC
  Filled 2024-05-25: qty 1

## 2024-05-25 MED ORDER — DEXTROSE 50 % IV SOLN
INTRAVENOUS | Status: AC
  Filled 2024-05-25: qty 50

## 2024-05-25 MED ORDER — TRAMADOL HCL 50 MG PO TABS
50.0000 mg | ORAL_TABLET | Freq: Four times a day (QID) | ORAL | Status: DC | PRN
  Filled 2024-05-25: qty 1

## 2024-05-25 MED ORDER — HEPARIN SODIUM (PORCINE) 1000 UNIT/ML DIALYSIS: 1000.0000 [IU] | INTRAMUSCULAR | Status: DC | PRN

## 2024-05-25 MED ORDER — PANCRELIPASE (LIP-PROT-AMYL) 12000-38000 UNITS PO CPEP
12000.0000 [IU] | ORAL_CAPSULE | Freq: Three times a day (TID) | ORAL | Status: DC
  Administered 2024-05-25 – 2024-06-01 (×19): 12000 [IU] via ORAL
  Filled 2024-05-25 (×21): qty 1

## 2024-05-25 MED ORDER — ATORVASTATIN CALCIUM 20 MG PO TABS
20.0000 mg | ORAL_TABLET | Freq: Every day | ORAL | Status: DC
  Administered 2024-05-25 – 2024-06-01 (×8): 20 mg via ORAL
  Filled 2024-05-25 (×9): qty 1

## 2024-05-25 MED ORDER — ENSURE PLUS HIGH PROTEIN PO LIQD: 237.0000 mL | Freq: Two times a day (BID) | ORAL | Status: DC

## 2024-05-25 MED ORDER — CARVEDILOL 6.25 MG PO TABS
12.5000 mg | ORAL_TABLET | Freq: Two times a day (BID) | ORAL | Status: DC
  Administered 2024-05-25 – 2024-05-31 (×12): 12.5 mg via ORAL
  Filled 2024-05-25 (×12): qty 2

## 2024-05-25 MED ORDER — GLUCOSE 40 % PO GEL
ORAL | Status: AC
  Administered 2024-05-25: 37.5 g
  Filled 2024-05-25: qty 1.21

## 2024-05-25 MED ORDER — ACETAMINOPHEN 325 MG PO TABS: 650.0000 mg | ORAL_TABLET | Freq: Four times a day (QID) | ORAL | Status: DC | PRN

## 2024-05-25 MED ORDER — ENOXAPARIN SODIUM 40 MG/0.4ML IJ SOSY: 40.0000 mg | PREFILLED_SYRINGE | INTRAMUSCULAR | Status: DC

## 2024-05-25 MED ORDER — SODIUM CHLORIDE 0.9 % IV SOLN
1.0000 g | INTRAVENOUS | Status: DC
  Filled 2024-05-25: qty 5

## 2024-05-25 MED ORDER — MAGNESIUM HYDROXIDE 400 MG/5ML PO SUSP: 30.0000 mL | Freq: Every day | ORAL | Status: DC | PRN

## 2024-05-25 MED ORDER — IRBESARTAN 150 MG PO TABS
75.0000 mg | ORAL_TABLET | Freq: Every day | ORAL | Status: DC
  Administered 2024-05-25 – 2024-05-27 (×3): 75 mg via ORAL
  Filled 2024-05-25 (×4): qty 1

## 2024-05-25 MED ORDER — ONDANSETRON HCL 4 MG PO TABS
4.0000 mg | ORAL_TABLET | Freq: Four times a day (QID) | ORAL | Status: DC | PRN
  Administered 2024-05-29: 4 mg via ORAL
  Filled 2024-05-25: qty 1

## 2024-05-25 MED ORDER — MELATONIN 5 MG PO TABS
5.0000 mg | ORAL_TABLET | Freq: Every evening | ORAL | Status: DC | PRN
  Administered 2024-05-27 – 2024-05-31 (×5): 5 mg via ORAL
  Filled 2024-05-25 (×5): qty 1

## 2024-05-25 MED ORDER — ALBUTEROL SULFATE (2.5 MG/3ML) 0.083% IN NEBU: 2.5000 mg | INHALATION_SOLUTION | Freq: Four times a day (QID) | RESPIRATORY_TRACT | Status: DC | PRN

## 2024-05-25 MED ORDER — PENTAFLUOROPROP-TETRAFLUOROETH EX AERO: 1.0000 | INHALATION_SPRAY | CUTANEOUS | Status: DC | PRN

## 2024-05-25 MED ORDER — DEXTROSE 50 % IV SOLN
1.0000 | Freq: Once | INTRAVENOUS | Status: AC
  Administered 2024-05-25: 50 mL via INTRAVENOUS
  Filled 2024-05-25: qty 50

## 2024-05-25 MED ORDER — FOLIC ACID 1 MG PO TABS
1.0000 mg | ORAL_TABLET | Freq: Every day | ORAL | Status: DC
  Administered 2024-05-25 – 2024-06-01 (×7): 1 mg via ORAL
  Filled 2024-05-25 (×7): qty 1

## 2024-05-25 MED ORDER — LEVOTHYROXINE SODIUM 25 MCG PO TABS
125.0000 ug | ORAL_TABLET | Freq: Every day | ORAL | Status: DC
  Administered 2024-05-25 – 2024-06-01 (×8): 125 ug via ORAL
  Filled 2024-05-25 (×8): qty 1

## 2024-05-25 MED ORDER — LABETALOL HCL 5 MG/ML IV SOLN: 10.0000 mg | INTRAVENOUS | Status: DC | PRN

## 2024-05-25 MED ORDER — ALTEPLASE 2 MG IJ SOLR: 2.0000 mg | Freq: Once | INTRAMUSCULAR | Status: DC | PRN

## 2024-05-25 MED ORDER — HEPARIN SODIUM (PORCINE) 5000 UNIT/ML IJ SOLN
5000.0000 [IU] | Freq: Three times a day (TID) | INTRAMUSCULAR | Status: DC
  Administered 2024-05-25 – 2024-06-01 (×23): 5000 [IU] via SUBCUTANEOUS
  Filled 2024-05-25 (×23): qty 1

## 2024-05-25 MED ORDER — ZINC SULFATE 220 (50 ZN) MG PO CAPS
220.0000 mg | ORAL_CAPSULE | Freq: Every day | ORAL | Status: DC
  Administered 2024-05-25 – 2024-06-01 (×7): 220 mg via ORAL
  Filled 2024-05-25 (×7): qty 1

## 2024-05-25 MED ORDER — AMLODIPINE BESYLATE 5 MG PO TABS
5.0000 mg | ORAL_TABLET | Freq: Every day | ORAL | Status: DC
  Administered 2024-05-25 – 2024-05-27 (×3): 5 mg via ORAL
  Filled 2024-05-25 (×3): qty 1

## 2024-05-25 MED ORDER — CHLORHEXIDINE GLUCONATE CLOTH 2 % EX PADS
6.0000 | MEDICATED_PAD | Freq: Every day | CUTANEOUS | Status: DC
  Administered 2024-05-25 – 2024-06-01 (×8): 6 via TOPICAL

## 2024-05-25 MED ORDER — LIDOCAINE HCL (PF) 1 % IJ SOLN: 5.0000 mL | INTRAMUSCULAR | Status: DC | PRN

## 2024-05-25 MED ORDER — SODIUM CHLORIDE 0.9 % IV SOLN: 1.0000 g | INTRAVENOUS | Status: DC

## 2024-05-25 MED ORDER — PANTOPRAZOLE SODIUM 40 MG PO TBEC
40.0000 mg | DELAYED_RELEASE_TABLET | Freq: Every day | ORAL | Status: DC
  Administered 2024-05-25 – 2024-06-01 (×7): 40 mg via ORAL
  Filled 2024-05-25 (×7): qty 1

## 2024-05-25 MED ORDER — SODIUM ZIRCONIUM CYCLOSILICATE 10 G PO PACK
10.0000 g | PACK | Freq: Once | ORAL | Status: AC
  Administered 2024-05-25: 10 g via ORAL
  Filled 2024-05-25: qty 1

## 2024-05-25 MED ORDER — LIDOCAINE-PRILOCAINE 2.5-2.5 % EX CREA: 1.0000 | TOPICAL_CREAM | CUTANEOUS | Status: DC | PRN

## 2024-05-25 MED ORDER — HYDRALAZINE HCL 20 MG/ML IJ SOLN
10.0000 mg | Freq: Four times a day (QID) | INTRAMUSCULAR | Status: DC | PRN
  Administered 2024-05-25: 10 mg via INTRAVENOUS
  Filled 2024-05-25: qty 1

## 2024-05-25 MED ORDER — RENA-VITE PO TABS
1.0000 | ORAL_TABLET | Freq: Every day | ORAL | Status: DC
  Administered 2024-05-25 – 2024-05-31 (×7): 1 via ORAL
  Filled 2024-05-25 (×8): qty 1

## 2024-05-25 MED ORDER — MIDODRINE HCL 5 MG PO TABS: 2.5000 mg | ORAL_TABLET | ORAL | Status: DC

## 2024-05-25 MED ORDER — ACETAMINOPHEN 650 MG RE SUPP: 650.0000 mg | Freq: Four times a day (QID) | RECTAL | Status: DC | PRN

## 2024-05-25 NOTE — Progress Notes (Signed)
 IV nurse consulted STAT due to left IV infiltrated and unable to place new IV. IV nurse at bedside and unable to place line with ultrasound. Dr. Marcelino called and order obtained for PICC placement.

## 2024-05-25 NOTE — Assessment & Plan Note (Addendum)
-   This is associated with end-stage renal disease on HD. - She will be admitted to an observation telemetry bed. - Hyperkalemia has been treated. - Nephrology consult will be obtained. - Dr. Marcelino was notified about the patient.

## 2024-05-25 NOTE — Progress Notes (Addendum)
 CODE STROKE  Subjective: Patient today acutely reported that she was unable to move her entire body.  Awake and alert.  Stares off at times.   Patient with code stroke on 1/28 after being sent from dialysis for right sided weakness.  Patient was outside of the window for thrombolytics at that time and no target lesion was identified for thrombectomy.  It seems that patient improved and was felt to be generally weak (4/5 throughout) on admission.      LKW: 05/25/24 @ 0800 TNK administration: No, not felt to be a stroke Thrombectomy Candidate: No, not felt to be a stroke mRankin 2  Objective: Current vital signs: BP (!) 170/92 (BP Location: Left Leg)   Pulse 79   Temp 98.5 F (36.9 C)   Resp 18   SpO2 99%  Vital signs in last 24 hours: Temp:  [98.4 F (36.9 C)-98.6 F (37 C)] 98.5 F (36.9 C) (01/30 0940) Pulse Rate:  [70-84] 79 (01/30 1124) Resp:  [14-18] 18 (01/30 0940) BP: (167-216)/(78-112) 170/92 (01/30 1124) SpO2:  [96 %-100 %] 99 % (01/30 0940)  Intake/Output from previous day: No intake/output data recorded. Intake/Output this shift: No intake/output data recorded. Nutritional status:  Diet Order             Diet 2 gram sodium Fluid consistency: Thin  Diet effective now                   Neurologic Exam: NIHSS components Score: Comment  1a Level of Conscious 0[x]  1[]  2[]  3[]    Stares off at times and is very tearful  1b LOC Questions 0[x]  1[]  2[]       1c LOC Commands 0[]  1[x]  2[]       2 Best Gaze 0[x]  1[]  2[]       3 Visual 0[x]  1[]  2[]  3[]      4 Facial Palsy 0[]  1[x]  2[]  3[]      5a Motor Arm - left 0[]  1[]  2[]  3[]  4[x]  UN[]    5b Motor Arm - Right 0[]  1[]  2[]  3[]  4[x]  UN[]    6a Motor Leg - Left 0[]  1[]  2[]  3[]  4[x]  UN[]    6b Motor Leg - Right 0[]  1[]  2[]  3[]  4[x]  UN[]    7 Limb Ataxia 0[x]  1[]  2[]  3[]  UN[]     8 Sensory 0[x]  1[]  2[]  UN[]      9 Best Language 0[x]  1[]  2[]  3[]      10 Dysarthria 0[x]  1[]  2[]  UN[]      11 Extinct. and Inattention 0[x]  1[]  2[]        TOTAL: 18      Lab Results: Basic Metabolic Panel: Recent Labs  Lab 05/23/24 0901 05/24/24 2240 05/25/24 0244  NA 140 141  --   K 4.2 6.2* 4.8  CL 97* 100  --   CO2 27 23  --   GLUCOSE 153* 104*  --   BUN 41* 66*  --   CREATININE 4.86* 7.61*  --   CALCIUM  9.6 9.8  --     Liver Function Tests: Recent Labs  Lab 05/23/24 0901  AST 21  ALT 12  ALKPHOS 95  BILITOT 0.7  PROT 7.4  ALBUMIN  4.1   No results for input(s): LIPASE, AMYLASE in the last 168 hours. No results for input(s): AMMONIA in the last 168 hours.  CBC: Recent Labs  Lab 05/23/24 0901 05/24/24 2240  WBC 5.6 4.5  NEUTROABS 4.2  --   HGB 9.8* 9.9*  HCT 31.0* 33.4*  MCV 99.4 104.7*  PLT 163 178    Cardiac Enzymes: No results for input(s): CKTOTAL, CKMB, CKMBINDEX, TROPONINI in the last 168 hours.  Lipid Panel: No results for input(s): CHOL, TRIG, HDL, CHOLHDL, VLDL, LDLCALC in the last 168 hours.  CBG: Recent Labs  Lab 05/25/24 0126 05/25/24 0823 05/25/24 0845 05/25/24 0941 05/25/24 1122  GLUCAP 103* 52* 66* 66* 153*    Microbiology: Results for orders placed or performed in visit on 05/24/24  Microscopic Examination     Status: Abnormal   Collection Time: 05/24/24 10:07 AM   Urine  Result Value Ref Range Status   WBC, UA >30 (A) 0 - 5 /hpf Final   RBC, Urine 11-30 (A) 0 - 2 /hpf Final   Epithelial Cells (non renal) 0-10 0 - 10 /hpf Final   Bacteria, UA Many (A) None seen/Few Final    Coagulation Studies: Recent Labs    05/23/24 0901  LABPROT 13.4  INR 1.0    Imaging: CT HEAD WITHOUT CONTRAST 05/25/2024 01:02:29 PM   TECHNIQUE: CT of the head was performed without the administration of intravenous contrast. Automated exposure control, iterative reconstruction, and/or weight based adjustment of the mA/kV was utilized to reduce the radiation dose to as low as reasonably achievable.   COMPARISON: Head CT and MRI 05/23/2024.   CLINICAL  HISTORY: Generalized weakness; code stroke called.   FINDINGS:   BRAIN AND VENTRICLES: There is no evidence of an acute infarct, intracranial hemorrhage, mass, midline shift, hydrocephalus, or extra-axial fluid collection. There is mild cerebral atrophy. Hypodensities in the cerebral white matter bilaterally are unchanged and nonspecific but compatible with mild chronic small vessel ischemic disease. Calcified atherosclerosis at the skull base.   ORBITS: No acute abnormality.   SINUSES: Moderate right frontal and right ethmoid sinus mucosal thickening. Partially visualized mild mucosal thickening in the right maxillary sinus. Clear mastoid air cells.   SOFT TISSUES AND SKULL: There is abnormal soft tissue in the right medial [place] region with underlying bone erosion (series 2 image 6), new from 12/24/2023. No acute skull fracture.   IMPRESSION: 1. Abnormal soft tissue in the right medial [place] region with underlying bone erosion, concerning for malignancy or less likely infection/osteomyelitis. Correlate with direct examination and consider tissue sampling. 2. No acute intracranial abnormality. 3. Mild chronic small vessel ischemic disease. 4. Cerebral Atrophy (ICD10-G31.9).   Medications: I have reviewed the patient's current medications. Scheduled:  amLODipine   5 mg Oral Daily   atorvastatin   20 mg Oral Daily   carvedilol   12.5 mg Oral BID WC   Chlorhexidine  Gluconate Cloth  6 each Topical Q0600   dextrose        feeding supplement  237 mL Oral BID BM   folic acid   1 mg Oral Daily   heparin  injection (subcutaneous)  5,000 Units Subcutaneous Q8H   hydrOXYzine   10 mg Oral QHS   irbesartan   75 mg Oral Daily   levothyroxine   125 mcg Oral Q0600   lipase/protease/amylase  12,000 Units Oral TID WC   LORazepam   0.5 mg Intravenous Once   midodrine   2.5 mg Oral Q M,W,F-HD   multivitamin  1 tablet Oral QHS   pantoprazole   40 mg Oral Daily   zinc  sulfate (50mg  elemental  zinc )  220 mg Oral Daily    Assessment/Plan: 59 y.o. 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 lupus, ESRD on dialysis, CLL, DM, HTN and hypothyroidism who today reports that she can not move her body.  CBG 52.  No focality on examination.  Low likelihood of acute infarct.  Previous code stroke on 1/28 for right sided symptoms.  Work up unremarkable including MRI/A of the brain.  Today's head CT personally reviewed and reveals no acute changes.  Concern that the patient may be having panic attacks based on the response with both occasions when she repeats over and over to not let her die.      Stroke Risk Factors - diabetes mellitus and hypertension   Plan: 1. No further neurological intervention recommended at this time   LOS: 0 days   Sonny Hock, MD Neurology  05/25/2024  12:52 PM

## 2024-05-25 NOTE — Progress Notes (Signed)
 Rapid Response Event Note   Reason for Call : Code Stroke   Initial Focused Assessment: Patient lying in bed crying.  Dr. Germaine at bedside completing Neuro assessment.       Interventions: CBG had already been obtained by primary nurse and was 52.  Unable to give IV dextrose  due to infiltrated IV.  IV attempted with no success. Stat IV consult placed. Patient being oral meds for hypoglycemia.    Plan of Care: Patient to have CT of head after access has been obtained. PRN medication to be administered for hypertension once IV access has been established.    Event Summary: Patient to remain in room  MD Notified: at bedside upon arrival Call Time:08:25 Arrival Upfz:914:70 End Time:09:04  Vernell Verla Horseman, RN

## 2024-05-25 NOTE — Progress Notes (Signed)
 SPIRITUAL CARE AND COUNSELING CONSULT NOTE   VISIT SUMMARY - Chaplain received code information from on-call Chaplain. Patient was not available as the medical team was working with the patient. Patient was alert enough to answer questions from the team. They were trying to test to see if the patient could receive water and food as they were administering several medications. Chaplain checked in with one of the nurses about the patient's status. Chaplain will follow-up  SPIRITUAL ENCOUNTER                                                                                                                                                                      Type of Visit: Initial Care provided to:: Pt not available Conversation partners present during encounter: Nurse Referral source: Code page Reason for visit: Code OnCall Visit: No   SPIRITUAL FRAMEWORK      GOALS       INTERVENTIONS   Spiritual Care Interventions Made: Compassionate presence    INTERVENTION OUTCOMES   Outcomes: Connection to spiritual care  SPIRITUAL CARE PLAN        If immediate needs arise, please contact [facility] 24 hour on call [phone]   DeJuan O Harris  05/25/2024 9:03 AM

## 2024-05-25 NOTE — Assessment & Plan Note (Signed)
-   Will continue PPI therapy.
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
## 2024-05-25 NOTE — Progress Notes (Signed)
 " [facility]  ROUNDING NOTE   Subjective:   Tonya Myers  is a 59 y.o.  female past medical history of end-stage renal disease on hemodialysis Monday Wednesday Friday schedule, ANCA related glomerulonephritis, diet-controlled diabetes, orthostatic hypotension and thrombocytopenia and CLL. She presents to ED with weakness over the past month and has been admitted for Hyperkalemia [E87.5] Weakness [R53.1] Hemodialysis patient [Z99.2]  Patient is known to our practice and receives outpatient dialysis treatments at [facility] on a MWF schedule. She completed a 2 hour treatment on Wednesday. She is seen laying in bed. Appears in distress. States she feels like she is dying, request her hand held so she doesn't die alone. Continues to state she can't move her right side.   Labs on ED arrival significant for potassium 6.2, BUN 66, and hgb 9.9.Ct head ordered today  We have been consulted to manage dialysis needs.    Objective:  Vital signs in last 24 hours:  Temp:  [98.4 F (36.9 C)-98.6 F (37 C)] 98.5 F (36.9 C) (01/30 0940) Pulse Rate:  [70-84] 79 (01/30 1124) Resp:  [14-18] 18 (01/30 0940) BP: (167-216)/(78-112) 170/92 (01/30 1124) SpO2:  [96 %-100 %] 99 % (01/30 0940)  Weight change:  There were no vitals filed for this visit.  Intake/Output: No intake/output data recorded.   Intake/Output this shift:  No intake/output data recorded.  Physical Exam: General: NAD, anxious   Head: Normocephalic, atraumatic. Moist oral mucosal membranes  Eyes: Anicteric  Lungs:  Clear to auscultation, room air  Heart: Regular rate and rhythm  Abdomen:  Soft, nontender  Extremities:  No peripheral edema.Rt side flaccid   Neurologic: Awake, alert  Skin: Warm,dry, no rash  Access: Rt AVF    Basic Metabolic Panel: Recent Labs  Lab 05/23/24 0901 05/24/24 2240 05/25/24 0244  NA 140 141  --   K 4.2 6.2* 4.8  CL 97* 100  --   CO2 27 23  --   GLUCOSE 153* 104*   --   BUN 41* 66*  --   CREATININE 4.86* 7.61*  --   CALCIUM  9.6 9.8  --     Liver Function Tests: Recent Labs  Lab 05/23/24 0901  AST 21  ALT 12  ALKPHOS 95  BILITOT 0.7  PROT 7.4  ALBUMIN  4.1   No results for input(s): LIPASE, AMYLASE in the last 168 hours. No results for input(s): AMMONIA in the last 168 hours.  CBC: Recent Labs  Lab 05/23/24 0901 05/24/24 2240  WBC 5.6 4.5  NEUTROABS 4.2  --   HGB 9.8* 9.9*  HCT 31.0* 33.4*  MCV 99.4 104.7*  PLT 163 178    Cardiac Enzymes: No results for input(s): CKTOTAL, CKMB, CKMBINDEX, TROPONINI in the last 168 hours.  BNP: Invalid input(s): POCBNP  CBG: Recent Labs  Lab 05/25/24 0126 05/25/24 0823 05/25/24 0845 05/25/24 0941 05/25/24 1122  GLUCAP 103* 52* 66* 66* 153*    Microbiology: Results for orders placed or performed in visit on 05/24/24  Microscopic Examination     Status: Abnormal   Collection Time: 05/24/24 10:07 AM   Urine  Result Value Ref Range Status   WBC, UA >30 (A) 0 - 5 /hpf Final   RBC, Urine 11-30 (A) 0 - 2 /hpf Final   Epithelial Cells (non renal) 0-10 0 - 10 /hpf Final   Bacteria, UA Many (A) None seen/Few Final    Coagulation Studies: Recent Labs    05/23/24 0901  LABPROT 13.4  INR 1.0    Urinalysis: Recent Labs    05/24/24 1007  GLUCOSEU Negative  BILIRUBINUR Negative  PROTEINUR 3+*  NITRITE Negative  LEUKOCYTESUR 3+*      Imaging: US  EKG SITE RITE Result Date: 05/25/2024 If Site Rite image not attached, placement could not be confirmed due to current cardiac rhythm.    Medications:     amLODipine   5 mg Oral Daily   atorvastatin   20 mg Oral Daily   carvedilol   12.5 mg Oral BID WC   Chlorhexidine  Gluconate Cloth  6 each Topical Q0600   dextrose        feeding supplement  237 mL Oral BID BM   folic acid   1 mg Oral Daily   heparin  injection (subcutaneous)  5,000 Units Subcutaneous Q8H   hydrOXYzine   10 mg Oral QHS   irbesartan   75 mg Oral  Daily   levothyroxine   125 mcg Oral Q0600   lipase/protease/amylase  12,000 Units Oral TID WC   midodrine   2.5 mg Oral Q M,W,F-HD   multivitamin  1 tablet Oral QHS   pantoprazole   40 mg Oral Daily   zinc  sulfate (50mg  elemental zinc )  220 mg Oral Daily   acetaminophen  **OR** acetaminophen , albuterol , dextrose , hydrALAZINE , magnesium  hydroxide, melatonin, ondansetron  **OR** ondansetron  (ZOFRAN ) IV, traMADol   Assessment/ Plan:  [name] is a 59 y.o.  female past medical history of end-stage renal disease on hemodialysis Monday Wednesday Friday schedule, ANCA related glomerulonephritis, diet-controlled diabetes, orthostatic hypotension and thrombocytopenia and CLL. She presents to ED with weakness over the past month and has been admitted for Hyperkalemia [E87.5] Weakness [R53.1] Hemodialysis patient [Z99.2]  [facility]/MWF/AVF  Hyperkalemia with end stage renal disease on hemodialysis. Potassium 6.2. Treated with shifting measures. Patient will receive dialysis later today. Next treatment scheduled for Monday.   2. Anemia of chronic kidney disease Lab Results  Component Value Date   HGB 9.9 (L) 05/24/2024    Hgb within optimal range for renal patient.   3. Weakness, right sided. Previous MRI brain/head/neck negative. CT head pending  4. Secondary Hyperparathyroidism: with outpatient labs: PTH 133, phosphorus 4.1, calcium  9.2 on 04/30/24.   Lab Results  Component Value Date   CALCIUM  9.8 05/24/2024   PHOS 5.8 (H) 12/30/2023    Will continue to monitor bone minerals.    LOS: 0 Tonya Myers 1/30/20261:12 PM   "

## 2024-05-25 NOTE — Progress Notes (Signed)
" °   05/25/24 1831  Vitals  Temp 98.3 F (36.8 C)  Temp Source Oral  BP (!) 172/96  MAP (mmHg) 119  BP Location Right Leg  BP Method Automatic  Patient Position (if appropriate) Lying  Pulse Rate 67  Pulse Rate Source Monitor  ECG Heart Rate 67  Resp 14  Oxygen Therapy  O2 Device Room Air  Patient Activity (if Appropriate) In bed  Pulse Oximetry Type Continuous  Oximetry Probe Site Changed No  During Treatment Monitoring  Blood Flow Rate (mL/min) 269 mL/min  Arterial Pressure (mmHg) -163.02 mmHg  Venous Pressure (mmHg) 205.64 mmHg  TMP (mmHg) 39.79 mmHg  Ultrafiltration Rate (mL/min) 1565 mL/min  Dialysate Flow Rate (mL/min) 299 ml/min  Dialysate Potassium Concentration 2  Dialysate Calcium  Concentration 2.5  Duration of HD Treatment -hour(s) 2.01 hour(s)  Cumulative Fluid Removed (mL) per Treatment  1136.88  HD Safety Checks Performed Yes  Intra-Hemodialysis Comments Tx completed  Post Treatment  Dialyzer Clearance Lightly streaked  Hemodialysis Intake (mL) 0 mL  Liters Processed 44  Fluid Removed (mL) 1200 mL  Tolerated HD Treatment Yes  Note  Patient Observations ran 2 hrs and 15 mins    "

## 2024-05-25 NOTE — Evaluation (Signed)
 Occupational Therapy Evaluation Patient Details Name: Tonya Myers MRN: [id number] DOB: 08/18/1965 Today's Date: 05/25/2024   History of Present Illness   59 y.o. female with medical history significant for end-stage renal disease on hemodialysis on Monday Wednesday and Fridays, asthma, CLL, depression, type diabetes mellitus, essential hypertension and hypothyroidism, who presented to the emergency room with acute onset of right-sided weakness concerning for CVA.     Clinical Impressions Pt was seen for OT evaluation and cotx with PT this date. Prior to hospital admission, pt was primarily using w/c and performing stand pivot with RW from various surfaces at home. Pt lives alone, gets meals through insurance, and endorses having increased difficulty managing medications and completing ADL/IADL. Pt presents with deficits in strength, coordination, and sensation (R worse than L) limiting their ability to perform ADL management at baseline level. Pt currently requires MIN A for bed mobility, MIN A +2 for STS and step pivot with RW. Pt heavily reliant on RW requiring MOD A for pericare. Pt would benefit from skilled OT services to address noted impairments and functional limitations (see below for any additional details) in order to maximize safety and independence while minimizing future risk of falls, injury, and readmission. Anticipate the need for follow up OT services upon acute hospital DC.    If plan is discharge home, recommend the following:   A lot of help with walking and/or transfers;A lot of help with bathing/dressing/bathroom;Assist for transportation;Assistance with cooking/housework;Help with stairs or ramp for entrance;Direct supervision/assist for medications management     Functional Status Assessment   Patient has had a recent decline in their functional status and demonstrates the ability to make significant improvements in function in a reasonable and predictable  amount of time.     Equipment Recommendations   Other (comment) (defer)     Recommendations for Other Services         Precautions/Restrictions   Precautions Precautions: Fall Recall of Precautions/Restrictions: Impaired Restrictions Weight Bearing Restrictions Per Provider Order: No     Mobility Bed Mobility Overal bed mobility: Needs Assistance Bed Mobility: Supine to Sit, Sit to Supine     Supine to sit: Min assist, HOB elevated, Used rails Sit to supine: Min assist        Transfers Overall transfer level: Needs assistance Equipment used: Rolling walker (2 wheels) Transfers: Sit to/from Stand, Bed to chair/wheelchair/BSC       Step pivot transfers: Min assist, +2 physical assistance            Balance Overall balance assessment: Needs assistance Sitting-balance support: No upper extremity supported, Feet supported Sitting balance-Leahy Scale: Fair     Standing balance support: Bilateral upper extremity supported, Single extremity supported, During functional activity, Reliant on assistive device for balance Standing balance-Leahy Scale: Poor Standing balance comment: heavy UE versus BUE support on RW/counter while completing pericare briefly                           ADL either performed or assessed with clinical judgement   ADL Overall ADL's : Needs assistance/impaired     Grooming: Sitting;Set up               Lower Body Dressing: Sit to/from stand;Moderate assistance;Minimal assistance Lower Body Dressing Details (indicate cue type and reason): very effortful to don socks and set up to don shoes with increased effort. Toilet Transfer: Minimal assistance;BSC/3in1;Rolling walker (2 wheels);Stand-pivot Statistician Details (indicate cue type and  reason): step pivot, cues for sequencing Toileting- Clothing Manipulation and Hygiene: Sit to/from stand;Moderate assistance Toileting - Clothing Manipulation Details (indicate  cue type and reason): for thoroughness, pt able to briefly attempt pericare but limited by impaired balance             Vision         Perception         Praxis         Pertinent Vitals/Pain Pain Assessment Pain Assessment: 0-10 Pain Score: 10-Worst pain ever Pain Location: R hand> R side in general Pain Descriptors / Indicators: Numbness, Tingling, Pins and needles Pain Intervention(s): Limited activity within patient's tolerance, Monitored during session, Repositioned     Extremity/Trunk Assessment Upper Extremity Assessment Upper Extremity Assessment: Generalized weakness;Right hand dominant;RUE deficits/detail (R weaker than L, impaired FMC/GMC and pins/needles on R) RUE Coordination: decreased fine motor;decreased gross motor   Lower Extremity Assessment Lower Extremity Assessment: Defer to PT evaluation;Generalized weakness;RLE deficits/detail (R weaker than L, impaired FMC/GMC and pins/needles on R) RLE Coordination: decreased fine motor;decreased gross motor       Communication Communication Communication: Impaired Factors Affecting Communication: Difficulty expressing self   Cognition Arousal: Alert Behavior During Therapy: WFL for tasks assessed/performed Cognition: No family/caregiver present to determine baseline                               Following commands: Impaired Following commands impaired: Follows one step commands with increased time, Follows multi-step commands inconsistently     Cueing  General Comments   Cueing Techniques: Verbal cues      Exercises     Shoulder Instructions      Home Living Family/patient expects to be discharged to:: Private residence Living Arrangements: Alone Available Help at Discharge: Home health;Available PRN/intermittently;Neighbor Type of Home: House Home Access: Stairs to enter [employer] of Steps: 1 Entrance Stairs-Rails: None Home Layout: Two level Alternate Level  Stairs-Number of Steps: 16 steps to 2nd floor but doesn't need to go upstairs   Bathroom Shower/Tub: Walk-in shower;Tub/shower unit;Sponge bathes at baseline   Bathroom Toilet: Handicapped height     Home Equipment: Agricultural Consultant (2 wheels);Cane - single point;BSC/3in1;Shower seat;Wheelchair - manual      Lives With:  (caregiver)    Prior Functioning/Environment Prior Level of Function : Needs assist             Mobility Comments: RW < w/c, was primarily performing stand pivot transfers from RW to/from w/c ADLs Comments: asks neighbor for assist if she feels she needs it; sponge bath, [employer] delivers groceries, doesn't drive anymore, [employer] meals, endorses not managing medications well on her own; [facility] RN was starting to assist with meds, 1x/wk    OT Problem List: Decreased strength;Decreased coordination;Pain;Impaired sensation;Impaired balance (sitting and/or standing);Decreased knowledge of use of DME or AE;Impaired UE functional use   OT Treatment/Interventions: Self-care/ADL training;Therapeutic exercise;Therapeutic activities;Neuromuscular education;Cognitive remediation/compensation;DME and/or AE instruction;Patient/family education;Balance training      OT Goals(Current goals can be found in the care plan section)   Acute Rehab OT Goals Patient Stated Goal: get stronger at rehab OT Goal Formulation: With patient Time For Goal Achievement: 06/08/24 Potential to Achieve Goals: Good ADL Goals Pt Will Perform Lower Body Dressing: sitting/lateral leans;sit to/from stand;with min assist Pt Will Transfer to Toilet: with supervision;bedside commode;stand pivot transfer (LRAD) Pt Will Perform Toileting - Clothing Manipulation and hygiene: sitting/lateral leans;sit to/from stand;with supervision Additional ADL Goal #1: Pt  will demo mod indep with med mgt task, 1/1 opportunity.   OT Frequency:  Min 2X/week    Co-evaluation PT/OT/SLP Co-Evaluation/Treatment: Yes Reason for  Co-Treatment: Complexity of the patient's impairments (multi-system involvement);To address functional/ADL transfers;For patient/therapist safety PT goals addressed during session: Mobility/safety with mobility;Balance;Proper use of DME OT goals addressed during session: ADL's and self-care      AM-PAC OT 6 Clicks Daily Activity     Outcome Measure Help from another person eating meals?: A Little Help from another person taking care of personal grooming?: A Little Help from another person toileting, which includes using toliet, bedpan, or urinal?: A Lot Help from another person bathing (including washing, rinsing, drying)?: A Lot Help from another person to put on and taking off regular upper body clothing?: A Little Help from another person to put on and taking off regular lower body clothing?: A Lot 6 Click Score: 15   End of Session Equipment Utilized During Treatment: Rolling walker (2 wheels)  Activity Tolerance: Patient tolerated treatment well Patient left: in bed;with call bell/phone within reach;with bed alarm set;Other (comment) (with PT)  OT Visit Diagnosis: Other abnormalities of gait and mobility (R26.89);Muscle weakness (generalized) (M62.81);Hemiplegia and hemiparesis Hemiplegia - Right/Left: Right Hemiplegia - dominant/non-dominant: Dominant Hemiplegia - caused by: Unspecified                Time: 8578-8557 OT Time Calculation (min): 21 min Charges:  OT General Charges $OT Visit: 1 Visit OT Evaluation $OT Eval Moderate Complexity: 1 Mod  Warren SAUNDERS., MPH, MS, OTR/L ascom [phone] 05/25/24, 3:46 PM

## 2024-05-25 NOTE — ED Notes (Signed)
 Flushed PIV that was placed by US  prior to transfer to C Pod, infiltrated, call placed to [facility] who will come and try to place another, IV insulin  and IV D50 not given due to loss of access, patient took Lokelma  po

## 2024-05-25 NOTE — ED Provider Notes (Signed)
 Signout received.  Ongoing right sided weakness, stroke workup negative just yesterday. Feels generalized weakness.  Feels mental fogginess. Last dialysis session was Wednesday when it was cut short due to her stroke symptoms which was then evaluated with negative workup. Her next session was due Friday morning. Her potassium today is 6.2.  Her EKG has no hyperkalemia EKG changes.  I am ordering for insulin /dextrose  and Lokelma .  I spoke with Dr. Marcelino of renal.  I am going to admit to hospital service.  She will get dialyzed in the morning.    PROCEDURES:  Critical Care performed: Yes, see critical care procedure note(s)  .Critical Care  Performed by: Cyrena Mylar, MD Authorized by: Cyrena Mylar, MD   Critical care provider statement:    Critical care time (minutes):  30   Critical care was time spent personally by me on the following activities:  Development of treatment plan with patient or surrogate, discussions with consultants, evaluation of patient's response to treatment, examination of patient, ordering and review of laboratory studies, ordering and review of radiographic studies, ordering and performing treatments and interventions, pulse oximetry, re-evaluation of patient's condition and review of old charts        Cyrena Mylar, MD 05/25/24 [phone]

## 2024-05-25 NOTE — Progress Notes (Signed)
 Pt receives outpt HD at [facility] on  MWF at  6:00am. Navigator following to assist with any HD needs.   Suzen Satchel Dialysis Navigator [phone]

## 2024-05-25 NOTE — Evaluation (Signed)
 Physical Therapy Evaluation Patient Details Name: Tonya Myers MRN: [id number] DOB: 09-15-1965 Today's Date: 05/25/2024  History of Present Illness  59 y.o. female with medical history significant for end-stage renal disease on hemodialysis on Monday Wednesday and Fridays, asthma, CLL, depression, type diabetes mellitus, essential hypertension and hypothyroidism, who presented to the emergency room with acute onset of right-sided weakness concerning for CVA.  Clinical Impression  Patient admitted with the above. PTA, patient lives home alone and was primarily using w/c and performing stand pivot with RW. Has a neighbor who assists as needed. Patient presents with weakness, impaired balance, impaired coordination, impaired cognition, and impaired sensation (R>L). Completed bed mobility with minA. Stood from EOB with modA+1 and RW. Performed step pivot transfer with similar assist and RW. Patient will benefit from skilled PT services during acute stay to address listed deficits. Patient will benefit from ongoing therapy at discharge to maximize functional independence and safety.       If plan is discharge home, recommend the following: A lot of help with walking and/or transfers;A lot of help with bathing/dressing/bathroom;Assistance with cooking/housework;Assist for transportation;Help with stairs or ramp for entrance   Can travel by private vehicle   Yes    Equipment Recommendations None recommended by PT  Recommendations for Other Services       Functional Status Assessment Patient has had a recent decline in their functional status and demonstrates the ability to make significant improvements in function in a reasonable and predictable amount of time.     Precautions / Restrictions Precautions Precautions: Fall Recall of Precautions/Restrictions: Impaired Restrictions Weight Bearing Restrictions Per Provider Order: No      Mobility  Bed Mobility Overal bed mobility:  Needs Assistance Bed Mobility: Supine to Sit, Sit to Supine     Supine to sit: Min assist, HOB elevated, Used rails Sit to supine: Min assist        Transfers Overall transfer level: Needs assistance Equipment used: Rolling [name] (2 wheels) Transfers: Sit to/from Stand, Bed to chair/wheelchair/BSC Sit to Stand: Mod assist   Step pivot transfers: Mod assist            Ambulation/Gait                  Stairs            Wheelchair Mobility     Tilt Bed    Modified Rankin (Stroke Patients Only)       Balance Overall balance assessment: Needs assistance Sitting-balance support: No upper extremity supported, Feet supported Sitting balance-Leahy Scale: Fair     Standing balance support: Bilateral upper extremity supported, Single extremity supported, During functional activity, Reliant on assistive device for balance Standing balance-Leahy Scale: Poor                               Pertinent Vitals/Pain Pain Assessment Pain Assessment: 0-10 Pain Score: 9  Pain Location: R hand> R side in general Pain Descriptors / Indicators: Numbness, Tingling, Pins and needles Pain Intervention(s): Monitored during session, Repositioned, Limited activity within patient's tolerance    Home Living Family/patient expects to be discharged to:: Private residence Living Arrangements: Alone Available Help at Discharge: Home health;Available PRN/intermittently;Neighbor Type of Home: House Home Access: Stairs to enter Entrance Stairs-Rails: None Entrance Stairs-Number of Steps: 1 Alternate Level Stairs-Number of Steps: 16 steps to 2nd floor but doesn't need to go upstairs Home Layout: Two level Home Equipment: Agricultural Consultant (  2 wheels);Cane - single point;BSC/3in1;Shower seat;Wheelchair - manual      Prior Function Prior Level of Function : Needs assist             Mobility Comments: RW < w/c, was primarily performing stand pivot transfers from RW  to/from w/c ADLs Comments: asks neighbor for assist if she feels she needs it; sponge bath, [employer] delivers groceries, doesn't drive anymore, [employer] meals, endorses not managing medications well on her own; [facility] RN was starting to assist with meds, 1x/wk     Extremity/Trunk Assessment   Upper Extremity Assessment Upper Extremity Assessment: Defer to OT evaluation RUE Coordination: decreased fine motor;decreased gross motor    Lower Extremity Assessment Lower Extremity Assessment: Generalized weakness RLE Coordination: decreased fine motor;decreased gross motor       Communication   Communication Communication: Impaired Factors Affecting Communication: Difficulty expressing self    Cognition Arousal: Alert Behavior During Therapy: WFL for tasks assessed/performed   PT - Cognitive impairments: No family/caregiver present to determine baseline                       PT - Cognition Comments: delayed processing, poor insight Following commands: Impaired Following commands impaired: Follows one step commands with increased time, Follows multi-step commands inconsistently     Cueing Cueing Techniques: Verbal cues     General Comments      Exercises     Assessment/Plan    PT Assessment Patient needs continued PT services  PT Problem List Decreased strength;Decreased activity tolerance;Decreased balance;Decreased mobility;Decreased coordination;Decreased cognition;Decreased knowledge of use of DME;Decreased safety awareness;Decreased knowledge of precautions       PT Treatment Interventions DME instruction;Gait training;Functional mobility training;Therapeutic activities;Balance training;Therapeutic exercise;Neuromuscular re-education;Patient/family education    PT Goals (Current goals can be found in the Care Plan section)  Acute Rehab PT Goals Patient Stated Goal: to go to rehab PT Goal Formulation: With patient Time For Goal Achievement: 06/08/24 Potential to  Achieve Goals: Good    Frequency Min 1X/week     Co-evaluation   Reason for Co-Treatment: Complexity of the patient's impairments (multi-system involvement);To address functional/ADL transfers;For patient/therapist safety PT goals addressed during session: Mobility/safety with mobility;Balance;Proper use of DME OT goals addressed during session: ADL's and self-care       AM-PAC PT 6 Clicks Mobility  Outcome Measure Help needed turning from your back to your side while in a flat bed without using bedrails?: A Little Help needed moving from lying on your back to sitting on the side of a flat bed without using bedrails?: A Little Help needed moving to and from a bed to a chair (including a wheelchair)?: A Lot Help needed standing up from a chair using your arms (e.g., wheelchair or bedside chair)?: A Lot Help needed to walk in hospital room?: A Lot Help needed climbing 3-5 steps with a railing? : A Lot 6 Click Score: 14    End of Session   Activity Tolerance: Patient tolerated treatment well Patient left: in bed;with call bell/phone within reach;with bed alarm set Nurse Communication: Mobility status PT Visit Diagnosis: Unsteadiness on feet (R26.81);Muscle weakness (generalized) (M62.81);Other abnormalities of gait and mobility (R26.89)    Time: 8578-8554 PT Time Calculation (min) (ACUTE ONLY): 24 min   Charges:   PT Evaluation $PT Eval Moderate Complexity: 1 Mod PT Treatments $Therapeutic Activity: 8-22 mins PT General Charges $$ ACUTE PT VISIT: 1 Visit         Maryanne Finder, PT, DPT Physical  Therapist - [place]  [facility]   Wataru Mccowen A Onalee Steinbach 05/25/2024, 3:53 PM

## 2024-05-25 NOTE — Progress Notes (Signed)
 Anticoagulation monitoring(Lovenox ):  59 yo female ordered Lovenox  40 mg Q24h    There were no vitals filed for this visit. BMI 30.13    Lab Results  Component Value Date   CREATININE 7.61 (H) 05/24/2024   CREATININE 4.86 (H) 05/23/2024   CREATININE 5.72 (H) 03/04/2024   Estimated Creatinine Clearance: 9.5 mL/min (A) (by C-G formula based on SCr of 7.61 mg/dL (H)). Hemoglobin & Hematocrit     Component Value Date/Time   HGB 9.9 (L) 05/24/2024 2240   HGB 9.6 (L) 11/04/2023 1015   HCT 33.4 (L) 05/24/2024 2240     Per Protocol for Patient with estCrcl < 15 ml/min and BMI < 30, will transition to Heparin  5000 units SQ Q8H.

## 2024-05-25 NOTE — Progress Notes (Signed)
 OT Cancellation Note  Patient Details Name: Tonya Myers MRN: [id number] DOB: 1965-10-27   Cancelled Treatment:    Reason Eval/Treat Not Completed: Medical issues which prohibited therapy. Consult received, chart reviewed. Code Stroke called. Will follow acutely and re-attempt as medically appropriate.   Tashia Leiterman R., MPH, MS, OTR/L ascom [phone] 05/25/24, 8:28 AM

## 2024-05-25 NOTE — Assessment & Plan Note (Signed)
-   She just had a negative brain MRI and MRA of the head and neck. - PT consult to be obtained.

## 2024-05-25 NOTE — Assessment & Plan Note (Signed)
 Will continue statin therapy

## 2024-05-25 NOTE — Progress Notes (Signed)
 Pt reported at 0800 that she couldn't mover her body, Code stroke called Pt Iv was occluded and PICC was placed. Pt was given oral glucose for Low CBG in the 60's  and morning medications for elevated BP.

## 2024-05-25 NOTE — Assessment & Plan Note (Addendum)
-   Will continue antihypertensive therapy while holding off ARB therapy.

## 2024-05-25 NOTE — Assessment & Plan Note (Signed)
-   She will be placed on IV Azactam  and will follow urine culture and sensitivity.

## 2024-05-25 NOTE — Assessment & Plan Note (Addendum)
-   Will continue Synthroid .

## 2024-05-25 NOTE — Progress Notes (Signed)
 SPIRITUAL CARE AND COUNSELING CONSULT NOTE   VISIT SUMMARY - Patient was overcome with grief and stated they were exhausted. Chaplain encouraged and provided compassionate presence.  SPIRITUAL ENCOUNTER                                                                                                                                                                      Type of Visit: Follow up Care provided to:: Patient Conversation partners present during encounter: Nurse Referral source: Chaplain assessment Reason for visit: Urgent spiritual support OnCall Visit: No   SPIRITUAL FRAMEWORK      GOALS       INTERVENTIONS   Spiritual Care Interventions Made: Compassionate presence, Encouragement    INTERVENTION OUTCOMES   Outcomes: Connection to spiritual care  SPIRITUAL CARE PLAN        If immediate needs arise, please contact [facility] 24 hour on call [phone]   Tonya Myers  05/25/2024 2:40 PM
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
## 2024-05-25 NOTE — Progress Notes (Signed)
 Peripherally Inserted Central Catheter Placement  The IV Nurse has discussed with the patient and/or persons authorized to consent for the patient, the purpose of this procedure and the potential benefits and risks involved with this procedure.  The benefits include less needle sticks, lab draws from the catheter, and the patient may be discharged home with the catheter. Risks include, but not limited to, infection, bleeding, blood clot (thrombus formation), and puncture of an artery; nerve damage and irregular heartbeat and possibility to perform a PICC exchange if needed/ordered by physician.  Alternatives to this procedure were also discussed.  Bard Power PICC patient education guide, fact sheet on infection prevention and patient information card has been provided to patient /or left at bedside.    PICC Placement Documentation  PICC Double Lumen 05/25/24 Left Brachial 41 cm 1 cm (Active)  Indication for Insertion or Continuance of Line Poor Vasculature-patient has had multiple peripheral attempts or PIVs lasting less than 24 hours 05/25/24 1045  Exposed Catheter (cm) 1 cm 05/25/24 1045  Site Assessment Clean, Dry, Intact;Edematous 05/25/24 1045  Lumen #1 Status Saline locked;Flushed;Blood return noted 05/25/24 1045  Lumen #2 Status Saline locked;Flushed;Blood return noted 05/25/24 1045  Dressing Type Transparent;Securing device 05/25/24 1045  Dressing Status Antimicrobial disc/dressing in place;Clean, Dry, Intact 05/25/24 1045  Line Care Connections checked and tightened 05/25/24 1045  Line Adjustment (NICU/IV Team Only) No 05/25/24 1045  Dressing Intervention New dressing 05/25/24 1045  Dressing Change Due 06/01/24 05/25/24 1045    Telephone consent obtained from pt's aunt, Reena Seltzer, per patient's request.   Matthias Merle Chenice 05/25/2024, 11:09 AM

## 2024-05-26 DIAGNOSIS — E875 Hyperkalemia: Secondary | ICD-10-CM | POA: Diagnosis not present

## 2024-05-26 LAB — GLUCOSE, CAPILLARY
Glucose-Capillary: 131 mg/dL — ABNORMAL HIGH (ref 70–99)
Glucose-Capillary: 137 mg/dL — ABNORMAL HIGH (ref 70–99)
Glucose-Capillary: 175 mg/dL — ABNORMAL HIGH (ref 70–99)

## 2024-05-26 LAB — BASIC METABOLIC PANEL WITH GFR
Anion gap: 14 (ref 5–15)
BUN: 44 mg/dL — ABNORMAL HIGH (ref 6–20)
CO2: 28 mmol/L (ref 22–32)
Calcium: 9.6 mg/dL (ref 8.9–10.3)
Chloride: 97 mmol/L — ABNORMAL LOW (ref 98–111)
Creatinine, Ser: 6.08 mg/dL — ABNORMAL HIGH (ref 0.44–1.00)
GFR, Estimated: 7 mL/min — ABNORMAL LOW
Glucose, Bld: 134 mg/dL — ABNORMAL HIGH (ref 70–99)
Potassium: 4.8 mmol/L (ref 3.5–5.1)
Sodium: 139 mmol/L (ref 135–145)

## 2024-05-26 LAB — MAGNESIUM: Magnesium: 2.1 mg/dL (ref 1.7–2.4)

## 2024-05-26 MED ORDER — OXYBUTYNIN CHLORIDE 5 MG PO TABS
5.0000 mg | ORAL_TABLET | Freq: Three times a day (TID) | ORAL | Status: DC
  Administered 2024-05-26 – 2024-06-01 (×17): 5 mg via ORAL
  Filled 2024-05-26 (×17): qty 1

## 2024-05-26 MED ORDER — PHENAZOPYRIDINE HCL 200 MG PO TABS
200.0000 mg | ORAL_TABLET | Freq: Three times a day (TID) | ORAL | Status: DC
  Administered 2024-05-26 – 2024-05-28 (×5): 200 mg via ORAL
  Filled 2024-05-26 (×8): qty 1

## 2024-05-26 MED ORDER — NEPRO/CARBSTEADY PO LIQD
237.0000 mL | Freq: Two times a day (BID) | ORAL | Status: DC
  Administered 2024-05-26 – 2024-06-01 (×3): 237 mL via ORAL

## 2024-05-26 MED ORDER — TRAMADOL HCL 50 MG PO TABS
50.0000 mg | ORAL_TABLET | Freq: Four times a day (QID) | ORAL | Status: DC | PRN
  Administered 2024-05-28 – 2024-05-31 (×4): 50 mg via ORAL
  Filled 2024-05-26 (×4): qty 1

## 2024-05-26 NOTE — Progress Notes (Signed)
 Mobility Specialist - Progress Note    05/26/24 1425  Mobility  Activity Stood with assistance;Ambulated with assistance;Dangled on edge of bed  Level of Assistance Contact guard assist, steadying assist  Assistive Device Front wheel walker  Distance Ambulated (ft) 180 ft  Range of Motion/Exercises Active  Activity Response Tolerated well  Mobility Referral Yes  Mobility visit 1 Mobility   Pt resting on bsc upon entry on RA. Pt STS and ambulates to hallway around NS CGA with RW. Chair follow carry (for safety). Pt returned to EOB and left with needs in reach. Bed alarm activated. Pt given verbal cuing to keep head raised to avoid tipping over walker.   Guido Rumble Mobility Specialist 05/26/24, 2:30 PM

## 2024-05-26 NOTE — TOC Progression Note (Addendum)
 Transition of Care [facility]) - Progression Note    Patient Details  Name: Tonya Myers MRN: [id number] Date of Birth: [birth year]  Transition [facility] [name] Contact  Lorraine LILLETTE [place], [place] Phone Number: 05/26/2024, 8:36 AM  Clinical Narrative:    CSW connected with pt to assess for DC planning.  Pt stated that she is not certain if she wants to pursue the recommended SNF for rehabilitation.  Pt states she will discuss with her family today and asked if CSW can follow up with her later.  ICM following.   Addendum: Pt called back and stated that she does need rehab and help with self car.  Pt first choice is [facility] in [place] because she has dialysis and next choice is [facility]. CSW will complete PASSR and FL2 to start Auth process. ICM following.  Expected Discharge Plan: Home/Self Care Barriers to Discharge: Continued Medical Work up               Expected Discharge Plan and Services In-house Referral: Clinical Social Work                                             Social Drivers of Health (SDOH) Interventions SDOH Screenings   Food Insecurity: No Food Insecurity (05/25/2024)  Housing: Low Risk (05/25/2024)  Transportation Needs: Unmet Transportation Needs (05/25/2024)  Utilities: At Risk (05/25/2024)  Depression (PHQ2-9): Low Risk (12/06/2023)  Financial Resource Strain: High Risk (07/06/2023)   Received from [facility]  Social Connections: Unknown (08/12/2023)  Tobacco Use: Low Risk (05/24/2024)    Readmission Risk Interventions    12/24/2021   12:03 PM  Readmission Risk Prevention Plan  Transportation Screening Complete  Medication Review (RN Care Manager) Complete  PCP or Specialist appointment within 3-5 days of discharge Complete  HRI or Home Care Consult Complete  SW Recovery Care/Counseling Consult Complete  Palliative Care Screening Not Applicable  Skilled Nursing Facility Complete

## 2024-05-26 NOTE — Plan of Care (Signed)

## 2024-05-26 NOTE — Progress Notes (Signed)
 " [facility]  ROUNDING NOTE   Subjective:   Tonya Myers  is a 59 y.o.  female past medical history of end-stage renal disease on hemodialysis Monday Wednesday Friday schedule, ANCA related glomerulonephritis, diet-controlled diabetes, orthostatic hypotension and thrombocytopenia and CLL. She presents to ED with weakness over the past month and has been admitted for Hyperkalemia [E87.5] Weakness [R53.1] Hemodialysis patient [Z99.2]  Patient is known to our practice and receives outpatient dialysis treatments at [facility] on a MWF schedule.   Patient seen sitting up in bed Fully alert and oriented Remembers that yesterday was a bad day    Objective:  Vital signs in last 24 hours:  Temp:  [97.8 F (36.6 C)-98.5 F (36.9 C)] 98.5 F (36.9 C) (01/31 0828) Pulse Rate:  [67-93] 79 (01/31 0828) Resp:  [12-18] 16 (01/31 0828) BP: (81-202)/(49-125) 174/81 (01/31 0828) SpO2:  [98 %-100 %] 100 % (01/31 0828) Weight:  [80 kg] 80 kg (01/30 1557)  Weight change:  Filed Weights   05/25/24 1557  Weight: 80 kg    Intake/Output: I/O last 3 completed shifts: In: 120 [P.O.:120] Out: 1200 [Other:1200]   Intake/Output this shift:  Total I/O In: 120 [P.O.:120] Out: -   Physical Exam: General: NAD  Head: Normocephalic, atraumatic. Moist oral mucosal membranes  Eyes: Anicteric  Lungs:  Clear to auscultation, room air  Heart: Regular rate and rhythm  Abdomen:  Soft, nontender  Extremities:  No peripheral edema.  Neurologic: Awake, alert  Skin: Warm,dry, no rash  Access: Rt AVF    Basic Metabolic Panel: Recent Labs  Lab 05/23/24 0901 05/24/24 2240 05/25/24 0244 05/25/24 1600 05/26/24 0436  NA 140 141  --  138 139  K 4.2 6.2* 4.8 5.6* 4.8  CL 97* 100  --  99 97*  CO2 27 23  --  22 28  GLUCOSE 153* 104*  --  106* 134*  BUN 41* 66*  --  72* 44*  CREATININE 4.86* 7.61*  --  8.27* 6.08*  CALCIUM  9.6 9.8  --  9.7 9.6  MG  --   --   --   --  2.1   PHOS  --   --   --  6.1*  --     Liver Function Tests: Recent Labs  Lab 05/23/24 0901 05/25/24 1600  AST 21  --   ALT 12  --   ALKPHOS 95  --   BILITOT 0.7  --   PROT 7.4  --   ALBUMIN  4.1 3.7   No results for input(s): LIPASE, AMYLASE in the last 168 hours. No results for input(s): AMMONIA in the last 168 hours.  CBC: Recent Labs  Lab 05/23/24 0901 05/24/24 2240 05/25/24 0500 05/25/24 1600  WBC 5.6 4.5 4.7 6.2  NEUTROABS 4.2  --   --   --   HGB 9.8* 9.9* 9.4* 11.0*  HCT 31.0* 33.4* 28.7* 33.0*  MCV 99.4 104.7* 100.7* 97.9  PLT 163 178 154 174    Cardiac Enzymes: No results for input(s): CKTOTAL, CKMB, CKMBINDEX, TROPONINI in the last 168 hours.  BNP: Invalid input(s): POCBNP  CBG: Recent Labs  Lab 05/25/24 1835 05/25/24 2117 05/26/24 0001 05/26/24 0358 05/26/24 0829  GLUCAP 115* 140* 175* 131* 137*    Microbiology: Results for orders placed or performed in visit on 05/24/24  Microscopic Examination     Status: Abnormal   Collection Time: 05/24/24 10:07 AM   Urine  Result Value Ref Range Status  WBC, UA >30 (A) 0 - 5 /hpf Final   RBC, Urine 11-30 (A) 0 - 2 /hpf Final   Epithelial Cells (non renal) 0-10 0 - 10 /hpf Final   Bacteria, UA Many (A) None seen/Few Final    Coagulation Studies: No results for input(s): LABPROT, INR in the last 72 hours.   Urinalysis: Recent Labs    05/24/24 1007  GLUCOSEU Negative  BILIRUBINUR Negative  PROTEINUR 3+*  NITRITE Negative  LEUKOCYTESUR 3+*      Imaging: CT HEAD WO CONTRAST ( ) Result Date: 05/25/2024 EXAM: CT HEAD WITHOUT CONTRAST 05/25/2024 01:02:29 PM TECHNIQUE: CT of the head was performed without the administration of intravenous contrast. Automated exposure control, iterative reconstruction, and/or weight based adjustment of the mA/kV was utilized to reduce the radiation dose to as low as reasonably achievable. COMPARISON: Head CT and MRI 05/23/2024. CLINICAL HISTORY:  Generalized weakness; code stroke called. FINDINGS: BRAIN AND VENTRICLES: There is no evidence of an acute infarct, intracranial hemorrhage, mass, midline shift, hydrocephalus, or extra-axial fluid collection. There is mild cerebral atrophy. Hypodensities in the cerebral white matter bilaterally are unchanged and nonspecific but compatible with mild chronic small vessel ischemic disease. Calcified atherosclerosis at the skull base. ORBITS: No acute abnormality. SINUSES: Moderate right frontal and right ethmoid sinus mucosal thickening. Partially visualized mild mucosal thickening in the right maxillary sinus. Clear mastoid air cells. SOFT TISSUES AND SKULL: There is abnormal soft tissue in the right medial canthal region with underlying bone erosion (series 2 image 6), new from 12/24/2023. No acute skull fracture. IMPRESSION: 1. Abnormal soft tissue in the right medial canthal region with underlying bone erosion, concerning for malignancy or less likely infection/osteomyelitis. Correlate with direct examination and consider tissue sampling. 2. No acute intracranial abnormality. 3. Mild chronic small vessel ischemic disease. 4. Cerebral Atrophy (ICD10-G31.9). Electronically signed by: Dasie Hamburg MD 05/25/2024 03:07 PM EST RP Workstation: [id number]   US  EKG SITE RITE Result Date: 05/25/2024 If Site Rite image not attached, placement could not be confirmed due to current cardiac rhythm.    Medications:     amLODipine   5 mg Oral Daily   atorvastatin   20 mg Oral Daily   carvedilol   12.5 mg Oral BID WC   Chlorhexidine  Gluconate Cloth  6 each Topical Q0600   feeding supplement (NEPRO CARB STEADY)  237 mL Oral BID BM   folic acid   1 mg Oral Daily   heparin  injection (subcutaneous)  5,000 Units Subcutaneous Q8H   hydrOXYzine   10 mg Oral QHS   irbesartan   75 mg Oral Daily   levothyroxine   125 mcg Oral Q0600   lipase/protease/amylase  12,000 Units Oral TID WC   LORazepam   0.5 mg Intravenous Once    midodrine   2.5 mg Oral Q M,W,F-HD   multivitamin  1 tablet Oral QHS   pantoprazole   40 mg Oral Daily   zinc  sulfate (50mg  elemental zinc )  220 mg Oral Daily   acetaminophen  **OR** acetaminophen , albuterol , hydrALAZINE , magnesium  hydroxide, melatonin, ondansetron  **OR** ondansetron  (ZOFRAN ) IV, traMADol   Assessment/ Plan:  [name] is a 59 y.o.  female past medical history of end-stage renal disease on hemodialysis Monday Wednesday Friday schedule, ANCA related glomerulonephritis, diet-controlled diabetes, orthostatic hypotension and thrombocytopenia and CLL. She presents to ED with weakness over the past month and has been admitted for Hyperkalemia [E87.5] Weakness [R53.1] Hemodialysis patient [Z99.2]  [facility]/MWF/AVF  Hyperkalemia with end stage renal disease on hemodialysis. Potassium 6.2, on Ed arrival. Corrected to 4.8 today.  Dialysis received yesterday with 1.1L of fluid removed. Next treatment scheduled for Monday.  2. Anemia of chronic kidney disease Lab Results  Component Value Date   HGB 11.0 (L) 05/25/2024    Hgb at goal for this patient  3. Weakness, right sided. Previous MRI brain/head/neck negative. CT head shows abnormal soft tissue  in right medial canthal region concerning for malignancy. Less likely infection.   4. Secondary Hyperparathyroidism: with outpatient labs: PTH 133, phosphorus 4.1, calcium  9.2 on 04/30/24.   Lab Results  Component Value Date   CALCIUM  9.6 05/26/2024   PHOS 6.1 (H) 05/25/2024    Phos slightly elevated, prescribed calcium  acetate outpatient.    LOS: 1 Tonya Myers 1/31/202610:53 AM   "
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
## 2024-05-26 NOTE — Plan of Care (Signed)
  Problem: Nutrition: Goal: Adequate nutrition will be maintained Outcome: Progressing   Problem: Safety: Goal: Ability to remain free from injury will improve Outcome: Progressing   

## 2024-05-27 DIAGNOSIS — E875 Hyperkalemia: Secondary | ICD-10-CM | POA: Diagnosis not present

## 2024-05-27 LAB — GLUCOSE, CAPILLARY
Glucose-Capillary: 98 mg/dL (ref 70–99)
Glucose-Capillary: 178 mg/dL — ABNORMAL HIGH (ref 70–99)
Glucose-Capillary: 196 mg/dL — ABNORMAL HIGH (ref 70–99)

## 2024-05-27 MED ORDER — LABETALOL HCL 5 MG/ML IV SOLN
10.0000 mg | Freq: Once | INTRAVENOUS | Status: AC
  Administered 2024-05-27: 10 mg via INTRAVENOUS
  Filled 2024-05-27: qty 4

## 2024-05-27 MED ORDER — AMLODIPINE BESYLATE 10 MG PO TABS
10.0000 mg | ORAL_TABLET | Freq: Every day | ORAL | Status: DC
  Administered 2024-05-27 – 2024-05-31 (×5): 10 mg via ORAL
  Filled 2024-05-27 (×5): qty 1

## 2024-05-27 MED ORDER — IRBESARTAN 150 MG PO TABS
150.0000 mg | ORAL_TABLET | Freq: Every day | ORAL | Status: DC
  Administered 2024-05-27 – 2024-05-28 (×2): 150 mg via ORAL
  Filled 2024-05-27 (×2): qty 1
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
## 2024-05-28 DIAGNOSIS — E875 Hyperkalemia: Secondary | ICD-10-CM | POA: Diagnosis not present

## 2024-05-28 LAB — RENAL FUNCTION PANEL
Albumin: 3.4 g/dL — ABNORMAL LOW (ref 3.5–5.0)
Anion gap: 17 — ABNORMAL HIGH (ref 5–15)
BUN: 74 mg/dL — ABNORMAL HIGH (ref 6–20)
CO2: 25 mmol/L (ref 22–32)
Calcium: 9.3 mg/dL (ref 8.9–10.3)
Chloride: 98 mmol/L (ref 98–111)
Creatinine, Ser: 8.76 mg/dL — ABNORMAL HIGH (ref 0.44–1.00)
GFR, Estimated: 5 mL/min — ABNORMAL LOW
Glucose, Bld: 96 mg/dL (ref 70–99)
Phosphorus: 6.7 mg/dL — ABNORMAL HIGH (ref 2.5–4.6)
Potassium: 5.2 mmol/L — ABNORMAL HIGH (ref 3.5–5.1)
Sodium: 139 mmol/L (ref 135–145)

## 2024-05-28 LAB — CBC WITH DIFFERENTIAL/PLATELET
Abs Immature Granulocytes: 0.07 10*3/uL (ref 0.00–0.07)
Basophils Absolute: 0 10*3/uL (ref 0.0–0.1)
Basophils Relative: 1 %
Eosinophils Absolute: 0.1 10*3/uL (ref 0.0–0.5)
Eosinophils Relative: 2 %
HCT: 26.6 % — ABNORMAL LOW (ref 36.0–46.0)
Hemoglobin: 8.3 g/dL — ABNORMAL LOW (ref 12.0–15.0)
Immature Granulocytes: 2 %
Lymphocytes Relative: 35 %
Lymphs Abs: 1.6 10*3/uL (ref 0.7–4.0)
MCH: 31.3 pg (ref 26.0–34.0)
MCHC: 31.2 g/dL (ref 30.0–36.0)
MCV: 100.4 fL — ABNORMAL HIGH (ref 80.0–100.0)
Monocytes Absolute: 0.4 10*3/uL (ref 0.1–1.0)
Monocytes Relative: 8 %
Neutro Abs: 2.3 10*3/uL (ref 1.7–7.7)
Neutrophils Relative %: 52 %
Platelets: 114 10*3/uL — ABNORMAL LOW (ref 150–400)
RBC: 2.65 MIL/uL — ABNORMAL LOW (ref 3.87–5.11)
RDW: 17.6 % — ABNORMAL HIGH (ref 11.5–15.5)
WBC: 4.5 10*3/uL (ref 4.0–10.5)
nRBC: 0 % (ref 0.0–0.2)

## 2024-05-28 LAB — HEPATITIS B SURFACE ANTIGEN: Hepatitis B Surface Ag: NONREACTIVE

## 2024-05-28 MED ORDER — IRBESARTAN 150 MG PO TABS
150.0000 mg | ORAL_TABLET | Freq: Once | ORAL | Status: AC
  Administered 2024-05-28: 150 mg via ORAL
  Filled 2024-05-28: qty 1

## 2024-05-28 MED ORDER — IRBESARTAN 150 MG PO TABS
300.0000 mg | ORAL_TABLET | Freq: Every day | ORAL | Status: DC
  Administered 2024-05-29 – 2024-06-01 (×4): 300 mg via ORAL
  Filled 2024-05-28 (×3): qty 2

## 2024-05-28 MED ORDER — CALCIUM ACETATE (PHOS BINDER) 667 MG PO CAPS
1334.0000 mg | ORAL_CAPSULE | Freq: Three times a day (TID) | ORAL | Status: DC
  Administered 2024-05-28 – 2024-06-01 (×8): 1334 mg via ORAL
  Filled 2024-05-28 (×8): qty 2

## 2024-05-28 NOTE — Plan of Care (Signed)

## 2024-05-28 NOTE — TOC Progression Note (Signed)
 Transition of Care [facility]) - Progression Note    Patient Details  Name: Damyiah Moxley MRN: [id number] Date of Birth: 1966-01-18  [facility] [name] Contact  Grayce JAYSON Perfect, RN Phone Number: 05/28/2024, 3:59 PM  Clinical Narrative:   RNCM reviewed chart.  FL2 completed for SNF .  Referrals sent out to multiple local SNF.      Expected Discharge Plan: Home/Self Care Barriers to Discharge: Continued Medical Work up               Expected Discharge Plan and Services In-house Referral: Clinical Social Work                                             Social Drivers of Health (SDOH) Interventions SDOH Screenings   Food Insecurity: No Food Insecurity (05/25/2024)  Housing: Low Risk (05/25/2024)  Transportation Needs: Unmet Transportation Needs (05/25/2024)  Utilities: At Risk (05/25/2024)  Depression (PHQ2-9): Low Risk (12/06/2023)  Financial Resource Strain: High Risk (07/06/2023)   Received from [facility]  Social Connections: Unknown (08/12/2023)  Tobacco Use: Low Risk (05/24/2024)    Readmission Risk Interventions    12/24/2021   12:03 PM  Readmission Risk Prevention Plan  Transportation Screening Complete  Medication Review (RN Care Manager) Complete  PCP or Specialist appointment within 3-5 days of discharge Complete  HRI or Home Care Consult Complete  SW Recovery Care/Counseling Consult Complete  Palliative Care Screening Not Applicable  Skilled Nursing Facility Complete

## 2024-05-28 NOTE — Progress Notes (Signed)
 Occupational Therapy Treatment Patient Details Name: Tonya Myers MRN: [id number] DOB: 10-Jun-1965 Today's Date: 05/28/2024   History of present illness 59 y.o. female with medical history significant for end-stage renal disease on hemodialysis on Monday Wednesday and Fridays, asthma, CLL, depression, type diabetes mellitus, essential hypertension and hypothyroidism, who presented to the emergency room with acute onset of right-sided weakness concerning for CVA.   OT comments  Pt seen for OT tx. Pt in bed, agreeable to session despite fatigue. Pt required increased effort/time throughout session but did demo ability to complete bed mobility with SBA, stood with RW from elevated bed with CGA, set up for donning slip on shoes EOB, and ambulated around nurses station taking several standing rest breaks. OT facilitated problem solving for meal prep, medication mgt (pt endorses difficulty managing herself), and activity pacing. Pt demonstrating progress towards goals, however, continues to benefit from skilled OT services and demo's less than ideal awareness of deficits to support safe activity pacing.       If plan is discharge home, recommend the following:  A lot of help with bathing/dressing/bathroom;Assist for transportation;Assistance with cooking/housework;Help with stairs or ramp for entrance;Direct supervision/assist for medications management;A little help with walking and/or transfers   Equipment Recommendations  Other (comment) (defer)    Recommendations for Other Services      Precautions / Restrictions Precautions Precautions: Fall Restrictions Weight Bearing Restrictions Per Provider Order: No       Mobility Bed Mobility Overal bed mobility: Needs Assistance Bed Mobility: Supine to Sit, Sit to Supine     Supine to sit: Supervision Sit to supine: Supervision   General bed mobility comments: incr effort    Transfers Overall transfer level: Needs  assistance Equipment used: Rolling walker (2 wheels) Transfers: Sit to/from Stand Sit to Stand: From elevated surface, Contact guard assist           General transfer comment: with bed elevated (from height pt reports her bed is at home), CGA with significant effort/time     Balance Overall balance assessment: Needs assistance Sitting-balance support: No upper extremity supported, Feet supported Sitting balance-Leahy Scale: Fair     Standing balance support: Bilateral upper extremity supported, During functional activity, Reliant on assistive device for balance Standing balance-Leahy Scale: Poor Standing balance comment: heavy BUE support on RW while walking with PRN VC for improving posture and minimizing weight through RW                           ADL either performed or assessed with clinical judgement   ADL Overall ADL's : Needs assistance/impaired                 Upper Body Dressing : Sitting;Set up Upper Body Dressing Details (indicate cue type and reason): gown Lower Body Dressing: Sitting/lateral leans;Set up Lower Body Dressing Details (indicate cue type and reason): don slip on shoes                    Extremity/Trunk Assessment              Vision       Perception     Praxis     Communication Communication Communication: No apparent difficulties   Cognition Arousal: Alert Behavior During Therapy: [facility] for tasks assessed/performed  Following commands: Intact Following commands impaired: Follows one step commands with increased time      Cueing   Cueing Techniques: Verbal cues  Exercises Other Exercises Other Exercises: Pt amb up to ~30' with RW at a time requiring several standing rest breaks (4+) and CGA with VC for posture to achieve full ~180' overall    Shoulder Instructions       General Comments      Pertinent Vitals/ Pain       Pain Assessment Pain Assessment:  0-10 Pain Score: 9  Pain Location: B hands and feet Pain Descriptors / Indicators: Numbness, Tingling, Pins and needles Pain Intervention(s): Limited activity within patient's tolerance, Monitored during session, Patient requesting pain meds-RN notified, Repositioned  Home Living                                          Prior Functioning/Environment              Frequency  Min 2X/week        Progress Toward Goals  OT Goals(current goals can now be found in the care plan section)  Progress towards OT goals: Progressing toward goals  Acute Rehab OT Goals Patient Stated Goal: get stronger at rehab OT Goal Formulation: With patient Time For Goal Achievement: 06/08/24 Potential to Achieve Goals: Good  Plan      Co-evaluation                 AM-PAC OT 6 Clicks Daily Activity     Outcome Measure   Help from another person eating meals?: None Help from another person taking care of personal grooming?: A Little Help from another person toileting, which includes using toliet, bedpan, or urinal?: A Little Help from another person bathing (including washing, rinsing, drying)?: A Lot Help from another person to put on and taking off regular upper body clothing?: A Little Help from another person to put on and taking off regular lower body clothing?: A Lot 6 Click Score: 17    End of Session Equipment Utilized During Treatment: Rolling walker (2 wheels)  OT Visit Diagnosis: Other abnormalities of gait and mobility (R26.89);Muscle weakness (generalized) (M62.81);Hemiplegia and hemiparesis Hemiplegia - Right/Left: Right Hemiplegia - dominant/non-dominant: Dominant Hemiplegia - caused by: Unspecified   Activity Tolerance Patient tolerated treatment well   Patient Left in bed;with call bell/phone within reach;with bed alarm set;with nursing/sitter in room   Nurse Communication Mobility status;Patient requests pain meds        Time:  8681-8663 OT Time Calculation (min): 18 min  Charges: OT General Charges $OT Visit: 1 Visit OT Treatments $Therapeutic Activity: 8-22 mins  Warren SAUNDERS., MPH, MS, OTR/L ascom [phone] 05/28/24, 1:57 PM

## 2024-05-29 ENCOUNTER — Telehealth: Payer: Self-pay | Admitting: Physician Assistant

## 2024-05-29 DIAGNOSIS — E875 Hyperkalemia: Secondary | ICD-10-CM | POA: Diagnosis not present

## 2024-05-29 LAB — GLUCOSE, CAPILLARY: Glucose-Capillary: 97 mg/dL (ref 70–99)

## 2024-05-29 LAB — POTASSIUM: Potassium: 5.8 mmol/L — ABNORMAL HIGH (ref 3.5–5.1)

## 2024-05-29 MED ORDER — EPOETIN ALFA-EPBX 4000 UNIT/ML IJ SOLN
INTRAMUSCULAR | Status: AC
  Filled 2024-05-29: qty 1

## 2024-05-29 MED ORDER — HYDRALAZINE HCL 50 MG PO TABS
50.0000 mg | ORAL_TABLET | Freq: Three times a day (TID) | ORAL | Status: DC
  Administered 2024-05-29 – 2024-05-31 (×7): 50 mg via ORAL
  Filled 2024-05-29 (×7): qty 1

## 2024-05-29 MED ORDER — EPOETIN ALFA-EPBX 4000 UNIT/ML IJ SOLN
4000.0000 [IU] | Freq: Once | INTRAMUSCULAR | Status: AC
  Administered 2024-05-29: 4000 [IU] via INTRAVENOUS

## 2024-05-29 NOTE — Plan of Care (Signed)

## 2024-05-29 NOTE — Progress Notes (Unsigned)
" ° °  05/31/2024 9:55 AM   Tonya Myers May 04, 1965 [id number]  Cystoscopy Procedure Note:  Indication:  Incomplete bladder emptying with bilateral hydroureteronephrosis - via CT ESRD on HD Oliguric, volitional voids 1-2 / day RBUS 05/10/24 - irregular bladder thickening, intravesical debris  After informed consent and discussion of the procedure and its risks, Tonya Myers was positioned and prepped in the standard fashion. Cystoscopy was performed with a flexible cystoscope. The external vaginal anatomy, urethra, bladder neck and bladder mucosa were visualized in a systematic fashion. The ureteral orifices were noted in orthotopic location and orientation. There were no bladder mucosal lesions, stones, debris or anatomic variants noted.   Imaging: Recent RBUS reviewed - irregular bladder thickening, intravesical debris  Findings: ***  Assessment and Plan: ***  Tonya Skye, MD 05/29/2024   "
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
## 2024-05-29 NOTE — Telephone Encounter (Signed)
 Her Vikor culture was negative for infection. Please keep plans for cysto as scheduled.

## 2024-05-30 DIAGNOSIS — E875 Hyperkalemia: Secondary | ICD-10-CM | POA: Diagnosis not present

## 2024-05-30 LAB — GLUCOSE, CAPILLARY: Glucose-Capillary: 79 mg/dL (ref 70–99)

## 2024-05-30 LAB — POTASSIUM: Potassium: 4.7 mmol/L (ref 3.5–5.1)

## 2024-05-30 MED ORDER — AMOXICILLIN-POT CLAVULANATE 875-125 MG PO TABS: 1.0000 | ORAL_TABLET | Freq: Two times a day (BID) | ORAL | Status: DC

## 2024-05-30 MED ORDER — CLONIDINE HCL 0.1 MG PO TABS
0.1000 mg | ORAL_TABLET | Freq: Two times a day (BID) | ORAL | Status: DC
  Administered 2024-05-30 – 2024-06-01 (×5): 0.1 mg via ORAL
  Filled 2024-05-30 (×5): qty 1

## 2024-05-30 MED ORDER — AMOXICILLIN-POT CLAVULANATE 250-125 MG PO TABS
1.0000 | ORAL_TABLET | Freq: Every day | ORAL | Status: DC
  Administered 2024-05-30 – 2024-05-31 (×2): 1 via ORAL
  Filled 2024-05-30 (×3): qty 1

## 2024-05-30 NOTE — Telephone Encounter (Signed)
Called patient and patient understood

## 2024-05-30 NOTE — Plan of Care (Signed)

## 2024-05-30 NOTE — TOC Progression Note (Signed)
 Transition of Care [facility]) - Progression Note    Patient Details  Name: Tonya Myers MRN: [id number] Date of Birth: 1966-01-25  Transition of Care [facility] [name] Contact  Grayce JAYSON Perfect, RN Phone Number: 05/30/2024, 4:32 PM  Clinical Narrative:   [facility] sent out 2 referrals to [facility] per patient request.  [facility] offered bed, but patient does not want to go to [place].  RNCM explained that when medically ready will need discharge plan.    Expected Discharge Plan: Home/Self Care Barriers to Discharge: Continued Medical Work up               Expected Discharge Plan and Services In-house Referral: Clinical Social Work                                             Social Drivers of Health (SDOH) Interventions SDOH Screenings   Food Insecurity: No Food Insecurity (05/25/2024)  Housing: Low Risk (05/25/2024)  Transportation Needs: Unmet Transportation Needs (05/25/2024)  Utilities: At Risk (05/25/2024)  Depression (PHQ2-9): Low Risk (12/06/2023)  Financial Resource Strain: High Risk (07/06/2023)   Received from [facility]  Social Connections: Unknown (08/12/2023)  Tobacco Use: Low Risk (05/24/2024)    Readmission Risk Interventions    12/24/2021   12:03 PM  Readmission Risk Prevention Plan  Transportation Screening Complete  Medication Review (RN Care Manager) Complete  PCP or Specialist appointment within 3-5 days of discharge Complete  HRI or Home Care Consult Complete  SW Recovery Care/Counseling Consult Complete  Palliative Care Screening Not Applicable  Skilled Nursing Facility Complete
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
## 2024-05-30 NOTE — Progress Notes (Addendum)
 " [facility]  ROUNDING NOTE   Subjective:   Tonya Myers  is a 59 y.o.  female past medical history of end-stage renal disease on hemodialysis Monday Wednesday Friday schedule, ANCA related glomerulonephritis, diet-controlled diabetes, orthostatic hypotension and thrombocytopenia and CLL. She presents to ED with weakness over the past month and has been admitted for Hyperkalemia [E87.5] Weakness [R53.1] Hemodialysis patient [Z99.2]  Patient is known to our practice and receives outpatient dialysis treatments at [facility] on a MWF schedule.   Patient sitting up in bed Voices concerns about her elevated blood pressure Denies headaches or dizziness   Objective:  Vital signs in last 24 hours:  Temp:  [97.8 F (36.6 C)-98.9 F (37.2 C)] 98.9 F (37.2 C) (02/04 0832) Pulse Rate:  [55-78] (P) 75 (02/04 0957) Resp:  [17-18] 18 (02/04 0832) BP: (176-213)/(70-96) (P) 166/77 (02/04 0957) SpO2:  [100 %] (P) 100 % (02/04 0957)  Weight change:  Filed Weights   05/25/24 1557 05/29/24 0903  Weight: 80 kg 85.6 kg    Intake/Output: I/O last 3 completed shifts: In: -  Out: 800 [Other:800]   Intake/Output this shift:  Total I/O In: -  Out: 25 [Urine:25]  Physical Exam: General: NAD  Head: Normocephalic, atraumatic. Moist oral mucosal membranes  Eyes: Anicteric  Lungs:  Clear to auscultation, room air  Heart: Regular rate and rhythm  Abdomen:  Soft, nontender  Extremities:  No peripheral edema.  Neurologic: Awake, alert  Skin: Warm,dry, no rash  Access: Rt AVF    Basic Metabolic Panel: Recent Labs  Lab 05/24/24 2240 05/25/24 0244 05/25/24 1600 05/26/24 0436 05/28/24 0730 05/29/24 0500 05/30/24 1104  NA 141  --  138 139 139  --   --   K 6.2*   < > 5.6* 4.8 5.2* 5.8* 4.7  CL 100  --  99 97* 98  --   --   CO2 23  --  22 28 25   --   --   GLUCOSE 104*  --  106* 134* 96  --   --   BUN 66*  --  72* 44* 74*  --   --   CREATININE 7.61*  --  8.27*  6.08* 8.76*  --   --   CALCIUM  9.8  --  9.7 9.6 9.3  --   --   MG  --   --   --  2.1  --   --   --   PHOS  --   --  6.1*  --  6.7*  --   --    < > = values in this interval not displayed.    Liver Function Tests: Recent Labs  Lab 05/25/24 1600 05/28/24 0730  ALBUMIN  3.7 3.4*   No results for input(s): LIPASE, AMYLASE in the last 168 hours. No results for input(s): AMMONIA in the last 168 hours.  CBC: Recent Labs  Lab 05/24/24 2240 05/25/24 0500 05/25/24 1600 05/28/24 0730  WBC 4.5 4.7 6.2 4.5  NEUTROABS  --   --   --  2.3  HGB 9.9* 9.4* 11.0* 8.3*  HCT 33.4* 28.7* 33.0* 26.6*  MCV 104.7* 100.7* 97.9 100.4*  PLT 178 154 174 114*    Cardiac Enzymes: No results for input(s): CKTOTAL, CKMB, CKMBINDEX, TROPONINI in the last 168 hours.  BNP: Invalid input(s): POCBNP  CBG: Recent Labs  Lab 05/27/24 1152 05/27/24 1537 05/27/24 2217 05/29/24 0730 05/30/24 0836  GLUCAP 98 178* 196* 97 79  Microbiology: Results for orders placed or performed in visit on 05/24/24  Microscopic Examination     Status: Abnormal   Collection Time: 05/24/24 10:07 AM   Urine  Result Value Ref Range Status   WBC, UA >30 (A) 0 - 5 /hpf Final   RBC, Urine 11-30 (A) 0 - 2 /hpf Final   Epithelial Cells (non renal) 0-10 0 - 10 /hpf Final   Bacteria, UA Many (A) None seen/Few Final    Coagulation Studies: No results for input(s): LABPROT, INR in the last 72 hours.   Urinalysis: No results for input(s): COLORURINE, LABSPEC, PHURINE, GLUCOSEU, HGBUR, BILIRUBINUR, KETONESUR, PROTEINUR, UROBILINOGEN, NITRITE, LEUKOCYTESUR in the last 72 hours.  Invalid input(s): APPERANCEUR     Imaging: No results found.    Medications:     amLODipine   10 mg Oral Daily   amoxicillin -clavulanate  1 tablet Oral Daily   atorvastatin   20 mg Oral Daily   calcium  acetate  1,334 mg Oral TID WC   carvedilol   12.5 mg Oral BID WC   Chlorhexidine  Gluconate  Cloth  6 each Topical Q0600   cloNIDine   0.1 mg Oral BID   feeding supplement (NEPRO CARB STEADY)  237 mL Oral BID BM   folic acid   1 mg Oral Daily   heparin  injection (subcutaneous)  5,000 Units Subcutaneous Q8H   hydrALAZINE   50 mg Oral Q8H   hydrOXYzine   10 mg Oral QHS   irbesartan   300 mg Oral Daily   levothyroxine   125 mcg Oral Q0600   lipase/protease/amylase  12,000 Units Oral TID WC   LORazepam   0.5 mg Intravenous Once   multivitamin  1 tablet Oral QHS   oxybutynin   5 mg Oral TID   pantoprazole   40 mg Oral Daily   zinc  sulfate (50mg  elemental zinc )  220 mg Oral Daily   acetaminophen  **OR** acetaminophen , albuterol , hydrALAZINE , magnesium  hydroxide, melatonin, ondansetron  **OR** ondansetron  (ZOFRAN ) IV, traMADol   Assessment/ Plan:  [name] is a 59 y.o.  female past medical history of end-stage renal disease on hemodialysis Monday Wednesday Friday schedule, ANCA related glomerulonephritis, diet-controlled diabetes, orthostatic hypotension and thrombocytopenia and CLL. She presents to ED with weakness over the past month and has been admitted for Hyperkalemia [E87.5] Weakness [R53.1] Hemodialysis patient [Z99.2]  [facility]/MWF/AVF  Hyperkalemia with end stage renal disease on hemodialysis. Potassium 6.2, on Ed arrival. Potassium corrected. Received dialysis yesterday with UF achieved. Will defer treatment until tomorrow.   2. Anemia of chronic kidney disease Lab Results  Component Value Date   HGB 8.3 (L) 05/28/2024    Hgb reduced, will order low dose retacrit  4000 units IV during dialysis tomorrow..   3. Weakness, right sided. Previous MRI brain/head/neck negative. CT head shows abnormal soft tissue  in right medial canthal region concerning for malignancy. Less likely infection. Brain MRI and MRA neck/head negative. Continue PT/OT, functioning right side.   4. Secondary Hyperparathyroidism: with outpatient labs: PTH 133, phosphorus 4.1, calcium   9.2 on 04/30/24.   Lab Results  Component Value Date   CALCIUM  9.3 05/28/2024   PHOS 6.7 (H) 05/28/2024    Will continue to monitor bone minerals. Continue calcium  acetate with meals.   5.  hypertension with chronic kidney disease.  Currently uncontrolled with amlodipine , carvedilol , and irbesartan .  Started hydralazine  50 mg 3 times daily yesterday evening.  Added clonidine  0.1 mg twice daily today.  Will continue to monitor and make adjustments as needed.   LOS: 5 Talya Quain 2/4/20261:50 PM   "

## 2024-05-30 NOTE — Care Management Important Message (Signed)
 Important Message  Patient Details  Name: Tonya Myers MRN: [id number] Date of Birth: 05-09-1965   Important Message Given:  Yes - Medicare IM     Minie Roadcap 05/30/2024, 11:16 AM

## 2024-05-30 NOTE — Progress Notes (Signed)
 Physical Therapy Treatment Patient Details Name: Tonya Myers MRN: [id number] DOB: 1965-11-03 Today's Date: 05/30/2024   History of Present Illness 59 y.o. female with medical history significant for end-stage renal disease on hemodialysis on Monday Wednesday and Fridays, asthma, CLL, depression, type diabetes mellitus, essential hypertension and hypothyroidism, who presented to the emergency room with acute onset of right-sided weakness concerning for CVA.    PT Comments  Pt was long sitting in bed upon arrival. She is A and set designer. Well known by dino from previous admissions. She seems at/if not close to baseline. Pt eager to get OOB and ambulate. Pt does no require physical assistance to exit bed. BP 168/64 (91) CGA for standing and ambulating with use of RW. Pt tolerated gait 160 ft with 3 standing rest. Pt endorses feeling weak and not at her baseline however seems to tolerate OOB activity well with vitals stable. Has had elevated BP throughout this admission. Planning to have HD tomorrow since was unable today. DC recs remain appropriate to maximize independence and safety with all ADLs.     If plan is discharge home, recommend the following: A lot of help with walking and/or transfers;A lot of help with bathing/dressing/bathroom;Assistance with cooking/housework;Assist for transportation;Help with stairs or ramp for entrance     Equipment Recommendations  None recommended by PT       Precautions / Restrictions Precautions Precautions: Fall Recall of Precautions/Restrictions: Intact Restrictions Weight Bearing Restrictions Per Provider Order: No     Mobility  Bed Mobility Overal bed mobility: Needs Assistance Bed Mobility: Supine to Sit  Supine to sit: Supervision  General bed mobility comments: no physical assistance to exit bed. sat EOB x ~ 5 minutes while checking vitals and getting extra gown. BP 168/64 (91)    Transfers Overall transfer level: Needs  assistance Equipment used: Rolling walker (2 wheels) Transfers: Sit to/from Stand Sit to Stand: From elevated surface, Contact guard assist  General transfer comment: CGA for safety    Ambulation/Gait Ambulation/Gait assistance: Contact guard assist Gait Distance (Feet): 160 Feet Assistive device: Rolling walker (2 wheels) Gait Pattern/deviations: Step-through pattern, Trunk flexed Gait velocity: decreased  General Gait Details: Author adjusted RW to improve pt's gait posture. she demonstrated safe abilities and tolerance to ambulation in hallway but does consistently report feeling weaker than normal    Balance Overall balance assessment: Needs assistance Sitting-balance support: No upper extremity supported, Feet supported Sitting balance-Leahy Scale: Fair     Standing balance support: Bilateral upper extremity supported, During functional activity, Reliant on assistive device for balance Standing balance-Leahy Scale: Fair Standing balance comment: heavy BUE support on RW while walking       Communication Communication Communication: No apparent difficulties  Cognition Arousal: Alert Behavior During Therapy: WFL for tasks assessed/performed   PT - Cognitive impairments: No family/caregiver present to determine baseline    PT - Cognition Comments: Pt is well known by chartered loss adjuster. seems at/close to baseline cognitive status Following commands: Intact      Cueing Cueing Techniques: Verbal cues, Tactile cues         Pertinent Vitals/Pain Pain Assessment Pain Assessment: No/denies pain     PT Goals (current goals can now be found in the care plan section) Acute Rehab PT Goals Patient Stated Goal: to go to rehab Progress towards PT goals: Progressing toward goals    Frequency    Min 1X/week       AM-PAC PT 6 Clicks Mobility   Outcome Measure  Help needed  turning from your back to your side while in a flat bed without using bedrails?: A Little Help needed  moving from lying on your back to sitting on the side of a flat bed without using bedrails?: A Little Help needed moving to and from a bed to a chair (including a wheelchair)?: A Little Help needed standing up from a chair using your arms (e.g., wheelchair or bedside chair)?: A Little Help needed to walk in hospital room?: A Little Help needed climbing 3-5 steps with a railing? : A Lot 6 Click Score: 17    End of Session Equipment Utilized During Treatment: Gait belt Activity Tolerance: Patient tolerated treatment well Patient left: in chair;with call bell/phone within reach;with chair alarm set Nurse Communication: Mobility status PT Visit Diagnosis: Unsteadiness on feet (R26.81);Muscle weakness (generalized) (M62.81);Other abnormalities of gait and mobility (R26.89)     Time: 8565-8547 PT Time Calculation (min) (ACUTE ONLY): 18 min  Charges:    $Gait Training: 8-22 mins PT General Charges $$ ACUTE PT VISIT: 1 Visit                    Rankin Essex PTA 05/30/24, 4:34 PM

## 2024-05-31 ENCOUNTER — Other Ambulatory Visit: Admitting: Urology

## 2024-05-31 DIAGNOSIS — E875 Hyperkalemia: Secondary | ICD-10-CM | POA: Diagnosis not present

## 2024-05-31 LAB — CBC WITH DIFFERENTIAL/PLATELET
Abs Immature Granulocytes: 0.07 10*3/uL (ref 0.00–0.07)
Basophils Absolute: 0 10*3/uL (ref 0.0–0.1)
Basophils Relative: 1 %
Eosinophils Absolute: 0.2 10*3/uL (ref 0.0–0.5)
Eosinophils Relative: 4 %
HCT: 25.4 % — ABNORMAL LOW (ref 36.0–46.0)
Hemoglobin: 8.1 g/dL — ABNORMAL LOW (ref 12.0–15.0)
Immature Granulocytes: 2 %
Lymphocytes Relative: 37 %
Lymphs Abs: 1.6 10*3/uL (ref 0.7–4.0)
MCH: 32.5 pg (ref 26.0–34.0)
MCHC: 31.9 g/dL (ref 30.0–36.0)
MCV: 102 fL — ABNORMAL HIGH (ref 80.0–100.0)
Monocytes Absolute: 0.5 10*3/uL (ref 0.1–1.0)
Monocytes Relative: 11 %
Neutro Abs: 1.9 10*3/uL (ref 1.7–7.7)
Neutrophils Relative %: 45 %
Platelets: 117 10*3/uL — ABNORMAL LOW (ref 150–400)
RBC: 2.49 MIL/uL — ABNORMAL LOW (ref 3.87–5.11)
RDW: 17.6 % — ABNORMAL HIGH (ref 11.5–15.5)
WBC: 4.2 10*3/uL (ref 4.0–10.5)
nRBC: 0 % (ref 0.0–0.2)

## 2024-05-31 LAB — RENAL FUNCTION PANEL
Albumin: 3.4 g/dL — ABNORMAL LOW (ref 3.5–5.0)
Anion gap: 15 (ref 5–15)
BUN: 64 mg/dL — ABNORMAL HIGH (ref 6–20)
CO2: 26 mmol/L (ref 22–32)
Calcium: 9 mg/dL (ref 8.9–10.3)
Chloride: 96 mmol/L — ABNORMAL LOW (ref 98–111)
Creatinine, Ser: 7.92 mg/dL — ABNORMAL HIGH (ref 0.44–1.00)
GFR, Estimated: 5 mL/min — ABNORMAL LOW
Glucose, Bld: 105 mg/dL — ABNORMAL HIGH (ref 70–99)
Phosphorus: 6.2 mg/dL — ABNORMAL HIGH (ref 2.5–4.6)
Potassium: 5.3 mmol/L — ABNORMAL HIGH (ref 3.5–5.1)
Sodium: 137 mmol/L (ref 135–145)

## 2024-05-31 MED ORDER — PENTAFLUOROPROP-TETRAFLUOROETH EX AERO
INHALATION_SPRAY | CUTANEOUS | Status: AC
  Filled 2024-05-31: qty 30

## 2024-05-31 NOTE — Progress Notes (Signed)
 PT attempt. Pt off floor for HD. Author will return late this afternoon.

## 2024-05-31 NOTE — Progress Notes (Signed)
 " [facility]  ROUNDING NOTE   Subjective:   Tonya Myers  is a 59 y.o.  female past medical history of end-stage renal disease on hemodialysis Monday Wednesday Friday schedule, ANCA related glomerulonephritis, diet-controlled diabetes, orthostatic hypotension and thrombocytopenia and CLL. She presents to ED with weakness over the past month and has been admitted for Hyperkalemia [E87.5] Weakness [R53.1] Hemodialysis patient [Z99.2]  Patient is known to our practice and receives outpatient dialysis treatments at [facility] on a MWF schedule.   Update Patient seen and evaluated during dialysis   HEMODIALYSIS FLOWSHEET:  Blood Flow Rate (mL/min): 199 mL/min Arterial Pressure (mmHg): -95.35 mmHg Venous Pressure (mmHg): 95.14 mmHg TMP (mmHg): 4.04 mmHg Ultrafiltration Rate (mL/min): 0 mL/min Dialysate Flow Rate (mL/min): 299 ml/min Dialysis Fluid Bolus: Normal Saline Bolus Amount (mL): 100 mL  Tolerating treatment well   Objective:  Vital signs in last 24 hours:  Temp:  [97.8 F (36.6 C)-98.3 F (36.8 C)] 97.9 F (36.6 C) (02/05 1110) Pulse Rate:  [37-138] 51 (02/05 1110) Resp:  [10-24] 15 (02/05 1110) BP: (82-160)/(54-83) 116/59 (02/05 1110) SpO2:  [94 %-100 %] 100 % (02/05 1110) Weight:  [81.7 kg-82.7 kg] 81.7 kg (02/05 1110)  Weight change:  Filed Weights   05/29/24 0903 05/31/24 0748 05/31/24 1110  Weight: 85.6 kg 82.7 kg 81.7 kg    Intake/Output: I/O last 3 completed shifts: In: 420 [P.O.:420] Out: 25 [Urine:25]   Intake/Output this shift:  Total I/O In: -  Out: 1000 [Other:1000]  Physical Exam: General: NAD  Head: Normocephalic, atraumatic. Moist oral mucosal membranes  Eyes: Anicteric  Lungs:  Clear to auscultation, room air  Heart: Regular rate and rhythm  Abdomen:  Soft, nontender  Extremities:  No peripheral edema.  Neurologic: Awake, alert  Skin: Warm,dry, no rash  Access: Rt AVF    Basic Metabolic Panel: Recent  Labs  Lab 05/24/24 2240 05/25/24 0244 05/25/24 1600 05/26/24 0436 05/28/24 0730 05/29/24 0500 05/30/24 1104 05/31/24 0800  NA 141  --  138 139 139  --   --  137  K 6.2*   < > 5.6* 4.8 5.2* 5.8* 4.7 5.3*  CL 100  --  99 97* 98  --   --  96*  CO2 23  --  22 28 25   --   --  26  GLUCOSE 104*  --  106* 134* 96  --   --  105*  BUN 66*  --  72* 44* 74*  --   --  64*  CREATININE 7.61*  --  8.27* 6.08* 8.76*  --   --  7.92*  CALCIUM  9.8  --  9.7 9.6 9.3  --   --  9.0  MG  --   --   --  2.1  --   --   --   --   PHOS  --   --  6.1*  --  6.7*  --   --  6.2*   < > = values in this interval not displayed.    Liver Function Tests: Recent Labs  Lab 05/25/24 1600 05/28/24 0730 05/31/24 0800  ALBUMIN  3.7 3.4* 3.4*   No results for input(s): LIPASE, AMYLASE in the last 168 hours. No results for input(s): AMMONIA in the last 168 hours.  CBC: Recent Labs  Lab 05/24/24 2240 05/25/24 0500 05/25/24 1600 05/28/24 0730 05/31/24 0800  WBC 4.5 4.7 6.2 4.5 4.2  NEUTROABS  --   --   --  2.3 1.9  HGB 9.9* 9.4* 11.0* 8.3* 8.1*  HCT 33.4* 28.7* 33.0* 26.6* 25.4*  MCV 104.7* 100.7* 97.9 100.4* 102.0*  PLT 178 154 174 114* 117*    Cardiac Enzymes: No results for input(s): CKTOTAL, CKMB, CKMBINDEX, TROPONINI in the last 168 hours.  BNP: Invalid input(s): POCBNP  CBG: Recent Labs  Lab 05/27/24 1152 05/27/24 1537 05/27/24 2217 05/29/24 0730 05/30/24 0836  GLUCAP 98 178* 196* 97 79    Microbiology: Results for orders placed or performed in visit on 05/24/24  Microscopic Examination     Status: Abnormal   Collection Time: 05/24/24 10:07 AM   Urine  Result Value Ref Range Status   WBC, UA >30 (A) 0 - 5 /hpf Final   RBC, Urine 11-30 (A) 0 - 2 /hpf Final   Epithelial Cells (non renal) 0-10 0 - 10 /hpf Final   Bacteria, UA Many (A) None seen/Few Final    Coagulation Studies: No results for input(s): LABPROT, INR in the last 72 hours.   Urinalysis: No  results for input(s): COLORURINE, LABSPEC, PHURINE, GLUCOSEU, HGBUR, BILIRUBINUR, KETONESUR, PROTEINUR, UROBILINOGEN, NITRITE, LEUKOCYTESUR in the last 72 hours.  Invalid input(s): APPERANCEUR     Imaging: No results found.    Medications:     amLODipine   10 mg Oral Daily   amoxicillin -clavulanate  1 tablet Oral Daily   atorvastatin   20 mg Oral Daily   calcium  acetate  1,334 mg Oral TID WC   carvedilol   12.5 mg Oral BID WC   Chlorhexidine  Gluconate Cloth  6 each Topical Q0600   cloNIDine   0.1 mg Oral BID   feeding supplement (NEPRO CARB STEADY)  237 mL Oral BID BM   folic acid   1 mg Oral Daily   heparin  injection (subcutaneous)  5,000 Units Subcutaneous Q8H   hydrALAZINE   50 mg Oral Q8H   hydrOXYzine   10 mg Oral QHS   irbesartan   300 mg Oral Daily   levothyroxine   125 mcg Oral Q0600   lipase/protease/amylase  12,000 Units Oral TID WC   LORazepam   0.5 mg Intravenous Once   multivitamin  1 tablet Oral QHS   oxybutynin   5 mg Oral TID   pantoprazole   40 mg Oral Daily   zinc  sulfate (50mg  elemental zinc )  220 mg Oral Daily   acetaminophen  **OR** acetaminophen , albuterol , hydrALAZINE , magnesium  hydroxide, melatonin, ondansetron  **OR** ondansetron  (ZOFRAN ) IV, traMADol   Assessment/ Plan:  [name] is a 59 y.o.  female past medical history of end-stage renal disease on hemodialysis Monday Wednesday Friday schedule, ANCA related glomerulonephritis, diet-controlled diabetes, orthostatic hypotension and thrombocytopenia and CLL. She presents to ED with weakness over the past month and has been admitted for Hyperkalemia [E87.5] Weakness [R53.1] Hemodialysis patient [Z99.2]  [facility]/MWF/AVF  Hyperkalemia with end stage renal disease on hemodialysis. Potassium 6.2, on Ed arrival. Potassium corrected. Receiving dialysis today due to increased census and inability to receive it yesterday. Next treatment scheduled for Friday  2. Anemia of  chronic kidney disease Lab Results  Component Value Date   HGB 8.1 (L) 05/31/2024    Hgb reduced, will order low dose retacrit  4000 units IV during dialysis tomorrow..   3. Weakness, right sided. Previous MRI brain/head/neck negative. CT head shows abnormal soft tissue  in right medial canthal region concerning for malignancy. Less likely infection. Brain MRI and MRA neck/head negative. Continue PT/OT, functioning right side.   4. Secondary Hyperparathyroidism: with outpatient labs: PTH 133, phosphorus 4.1, calcium  9.2 on 04/30/24.   Lab Results  Component Value Date   CALCIUM  9.0 05/31/2024   PHOS 6.2 (H) 05/31/2024    Phosphorus slowly improving. Continue calcium  acetate with meals.   5.  hypertension with chronic kidney disease.  Currently uncontrolled with amlodipine , carvedilol , and irbesartan .  Started hydralazine  50 mg 3 times daily yesterday evening.  Added clonidine  0.1 mg twice daily. Blood pressure well controlled at this time   LOS: 6 Andreanna Mikolajczak 2/5/202612:43 PM   "

## 2024-05-31 NOTE — Progress Notes (Signed)
" °  Received patient in bed to unit.   Informed consent signed and in chart.    TX duration: 2:55 hrs Rinsed back 5  min early  d/t low b/p   Pts b/p soft most of tx.  Pt received b/p meds in AM before tx.  Unable to remove ordered fluid.     Transported back to floor  Hand-off given to patient's nurse. No acute distress noted    Access used: LAVF Access issues: none   Total UF removed: 1.0L Medication(s) given: none Post HD VS: wnl Post HD weight: 81.7kg     Olivia Hurst LPN [facility]   "
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
## 2024-05-31 NOTE — Progress Notes (Signed)
" °   05/31/24 1324  OT Visit Information  Last OT Received On 05/31/24  Reason Eval/Treat Not Completed Patient declined, no reason specified   Patient received in bed and refused OT treatment session requesting to either be seen later or tomorrow as she reported she just returned from dialysis and she is too tired to participate at this time.  Will re-attempt at a later time/date as schedule permits. "

## 2024-05-31 NOTE — TOC Progression Note (Signed)
 Transition of Care [facility]) - Progression Note    Patient Details  Name: Tonya Myers MRN: [id number] Date of Birth: [birth year]  Transition of Care [facility]) [name] Contact  Grayce JAYSON Perfect, RN Phone Number: 05/31/2024, 3:01 PM  Clinical Narrative:   RNCM met with patient in her room, she continues to refuse bed offer at [facility] in [place].  She states she can stay until a facility is found per note in chart showed to her by a RN on unit.  RNCM explained to her that is not the case that when she is medically ready that she will need to take bed offer or return home.  Case went sent out last week and only one offer was made.  MD came into room while I was there and explained to her that she is medically ready and will plan to discharge home with home health or accept offered facility.  She chooses home with home health.  Referrals sent out to local homecare agencies, she prefers [facility].  Will await offers from agencies.  Plan for discharge tomorrow.     Expected Discharge Plan: Home/Self Care Barriers to Discharge: Continued Medical Work up               Expected Discharge Plan and Services In-house Referral: Clinical Social Work       Expected Discharge Date: 05/31/24                                     Social Drivers of Health (SDOH) Interventions SDOH Screenings   Food Insecurity: No Food Insecurity (05/25/2024)  Housing: Low Risk (05/25/2024)  Transportation Needs: Unmet Transportation Needs (05/25/2024)  Utilities: At Risk (05/25/2024)  Depression (PHQ2-9): Low Risk (12/06/2023)  Financial Resource Strain: High Risk (07/06/2023)   Received from [facility]  Social Connections: Unknown (08/12/2023)  Tobacco Use: Low Risk (05/24/2024)    Readmission Risk Interventions    12/24/2021   12:03 PM  Readmission Risk Prevention Plan  Transportation Screening Complete  Medication Review (RN Care Manager) Complete  PCP or Specialist  appointment within 3-5 days of discharge Complete  HRI or Home Care Consult Complete  SW Recovery Care/Counseling Consult Complete  Palliative Care Screening Not Applicable  Skilled Nursing Facility Complete

## 2024-06-01 ENCOUNTER — Other Ambulatory Visit: Payer: Self-pay

## 2024-06-01 ENCOUNTER — Encounter: Payer: Self-pay | Admitting: Oncology

## 2024-06-01 LAB — CBC
HCT: 26.6 % — ABNORMAL LOW (ref 36.0–46.0)
Hemoglobin: 8.2 g/dL — ABNORMAL LOW (ref 12.0–15.0)
MCH: 31.9 pg (ref 26.0–34.0)
MCHC: 30.8 g/dL (ref 30.0–36.0)
MCV: 103.5 fL — ABNORMAL HIGH (ref 80.0–100.0)
Platelets: 117 10*3/uL — ABNORMAL LOW (ref 150–400)
RBC: 2.57 MIL/uL — ABNORMAL LOW (ref 3.87–5.11)
RDW: 17.6 % — ABNORMAL HIGH (ref 11.5–15.5)
WBC: 4.5 10*3/uL (ref 4.0–10.5)
nRBC: 0 % (ref 0.0–0.2)

## 2024-06-01 LAB — RENAL FUNCTION PANEL
Albumin: 3.4 g/dL — ABNORMAL LOW (ref 3.5–5.0)
Anion gap: 13 (ref 5–15)
BUN: 48 mg/dL — ABNORMAL HIGH (ref 6–20)
CO2: 28 mmol/L (ref 22–32)
Calcium: 8.9 mg/dL (ref 8.9–10.3)
Chloride: 96 mmol/L — ABNORMAL LOW (ref 98–111)
Creatinine, Ser: 5.69 mg/dL — ABNORMAL HIGH (ref 0.44–1.00)
GFR, Estimated: 8 mL/min — ABNORMAL LOW
Glucose, Bld: 176 mg/dL — ABNORMAL HIGH (ref 70–99)
Phosphorus: 5.1 mg/dL — ABNORMAL HIGH (ref 2.5–4.6)
Potassium: 4.8 mmol/L (ref 3.5–5.1)
Sodium: 136 mmol/L (ref 135–145)

## 2024-06-01 MED ORDER — PANTOPRAZOLE SODIUM 40 MG PO TBEC: DELAYED_RELEASE_TABLET | ORAL | Status: AC

## 2024-06-01 MED ORDER — CLONIDINE HCL 0.1 MG PO TABS
0.1000 mg | ORAL_TABLET | Freq: Two times a day (BID) | ORAL | 2 refills | Status: DC
  Filled 2024-06-01: qty 60, 30d supply, fill #0

## 2024-06-01 MED ORDER — AMOXICILLIN-POT CLAVULANATE 250-125 MG PO TABS
1.0000 | ORAL_TABLET | Freq: Every day | ORAL | 0 refills | Status: AC
  Filled 2024-06-01: qty 5, 5d supply, fill #0

## 2024-06-01 MED ORDER — TELMISARTAN 80 MG PO TABS
80.0000 mg | ORAL_TABLET | Freq: Every day | ORAL | 2 refills | Status: AC
  Filled 2024-06-01: qty 30, 30d supply, fill #0

## 2024-06-01 MED ORDER — CLOTRIMAZOLE-BETAMETHASONE 1-0.05 % EX CREA: 1.0000 | TOPICAL_CREAM | Freq: Every day | CUTANEOUS | Status: AC | PRN

## 2024-06-01 MED ORDER — LEVALBUTEROL HCL 1.25 MG/3ML IN NEBU: 1.2500 mg | INHALATION_SOLUTION | Freq: Three times a day (TID) | RESPIRATORY_TRACT | Status: AC | PRN

## 2024-06-01 MED ORDER — CARVEDILOL 12.5 MG PO TABS
12.5000 mg | ORAL_TABLET | Freq: Two times a day (BID) | ORAL | 2 refills | Status: AC
  Filled 2024-06-01: qty 60, 30d supply, fill #0

## 2024-06-01 MED ORDER — FLUTICASONE PROPIONATE 50 MCG/ACT NA SUSP: 2.0000 | Freq: Every day | NASAL | Status: AC | PRN

## 2024-06-01 MED ORDER — OXYBUTYNIN CHLORIDE 5 MG PO TABS
5.0000 mg | ORAL_TABLET | Freq: Three times a day (TID) | ORAL | 0 refills | Status: AC
  Filled 2024-06-01: qty 90, 30d supply, fill #0

## 2024-06-01 MED ORDER — MELATONIN 3 MG PO TABS: 6.0000 mg | ORAL_TABLET | Freq: Every evening | ORAL | Status: AC | PRN

## 2024-06-01 MED ORDER — EPOETIN ALFA-EPBX 4000 UNIT/ML IJ SOLN
INTRAMUSCULAR | Status: AC
  Filled 2024-06-01: qty 1

## 2024-06-01 MED ORDER — EPOETIN ALFA-EPBX 4000 UNIT/ML IJ SOLN
4000.0000 [IU] | Freq: Once | INTRAMUSCULAR | Status: AC
  Administered 2024-06-01: 4000 [IU] via INTRAVENOUS

## 2024-06-01 MED ORDER — CALCIUM ACETATE (PHOS BINDER) 667 MG PO CAPS
1334.0000 mg | ORAL_CAPSULE | Freq: Three times a day (TID) | ORAL | 2 refills | Status: AC
  Filled 2024-06-01: qty 180, 30d supply, fill #0

## 2024-06-01 MED ORDER — AMLODIPINE BESYLATE 10 MG PO TABS
10.0000 mg | ORAL_TABLET | Freq: Every day | ORAL | 2 refills | Status: AC
  Filled 2024-06-01: qty 30, 30d supply, fill #0

## 2024-06-01 MED ORDER — CETIRIZINE HCL 10 MG PO TABS: 10.0000 mg | ORAL_TABLET | Freq: Every day | ORAL | Status: AC | PRN

## 2024-06-01 NOTE — Progress Notes (Signed)
 PT Cancellation Note  Patient Details Name: Tonya Myers MRN: [id number] DOB: 01-14-1966   Cancelled Treatment:     PT attempt. Pt endorse she is going to rehab this afternoon and wanted to hold session at this time so she can get a bath and pack her things. Acute PT will continue to follow per current POC.    [name] 06/01/2024, 2:41 PM

## 2024-06-01 NOTE — Progress Notes (Signed)
 " [facility]  ROUNDING NOTE   Subjective:   Tonya Myers  is a 59 y.o.  female past medical history of end-stage renal disease on hemodialysis Monday Wednesday Friday schedule, ANCA related glomerulonephritis, diet-controlled diabetes, orthostatic hypotension and thrombocytopenia and CLL. She presents to ED with weakness over the past month and has been admitted for Hyperkalemia [E87.5] Weakness [R53.1] Hemodialysis patient [Z99.2]  Patient is known to our practice and receives outpatient dialysis treatments at [facility] on a MWF schedule.   Update Patient seen and evaluated during dialysis   HEMODIALYSIS FLOWSHEET:  Blood Flow Rate (mL/min): 349 mL/min Arterial Pressure (mmHg): -150.5 mmHg Venous Pressure (mmHg): 173.93 mmHg TMP (mmHg): 14.95 mmHg Ultrafiltration Rate (mL/min): 707 mL/min Dialysate Flow Rate (mL/min): 299 ml/min Dialysis Fluid Bolus: Normal Saline Bolus Amount (mL): 100 mL  Tolerating treatment well BP stable   Objective:  Vital signs in last 24 hours:  Temp:  [98 F (36.7 C)-99.2 F (37.3 C)] 98.3 F (36.8 C) (02/06 0851) Pulse Rate:  [50-70] 56 (02/06 1130) Resp:  [11-18] 16 (02/06 1130) BP: (92-127)/(44-60) 93/44 (02/06 1130) SpO2:  [97 %-100 %] 100 % (02/06 1130) Weight:  [80.8 kg] 80.8 kg (02/06 0851)  Weight change:  Filed Weights   05/31/24 0748 05/31/24 1110 06/01/24 0851  Weight: 82.7 kg 81.7 kg 80.8 kg    Intake/Output: I/O last 3 completed shifts: In: -  Out: 1000 [Other:1000]   Intake/Output this shift:  No intake/output data recorded.  Physical Exam: General: NAD  Head: Normocephalic, atraumatic. Moist oral mucosal membranes  Eyes: Anicteric  Lungs:  Clear to auscultation, room air  Heart: Regular rate and rhythm  Abdomen:  Soft, nontender  Extremities:  No peripheral edema.  Neurologic: Awake, alert  Skin: Warm,dry, no rash  Access: Rt AVF    Basic Metabolic Panel: Recent Labs  Lab  05/25/24 1600 05/26/24 0436 05/28/24 0730 05/29/24 0500 05/30/24 1104 05/31/24 0800 06/01/24 0859  NA 138 139 139  --   --  137 136  K 5.6* 4.8 5.2* 5.8* 4.7 5.3* 4.8  CL 99 97* 98  --   --  96* 96*  CO2 22 28 25   --   --  26 28  GLUCOSE 106* 134* 96  --   --  105* 176*  BUN 72* 44* 74*  --   --  64* 48*  CREATININE 8.27* 6.08* 8.76*  --   --  7.92* 5.69*  CALCIUM  9.7 9.6 9.3  --   --  9.0 8.9  MG  --  2.1  --   --   --   --   --   PHOS 6.1*  --  6.7*  --   --  6.2* 5.1*    Liver Function Tests: Recent Labs  Lab 05/25/24 1600 05/28/24 0730 05/31/24 0800 06/01/24 0859  ALBUMIN  3.7 3.4* 3.4* 3.4*   No results for input(s): LIPASE, AMYLASE in the last 168 hours. No results for input(s): AMMONIA in the last 168 hours.  CBC: Recent Labs  Lab 05/25/24 1600 05/28/24 0730 05/31/24 0800 06/01/24 0859  WBC 6.2 4.5 4.2 4.5  NEUTROABS  --  2.3 1.9  --   HGB 11.0* 8.3* 8.1* 8.2*  HCT 33.0* 26.6* 25.4* 26.6*  MCV 97.9 100.4* 102.0* 103.5*  PLT 174 114* 117* 117*    Cardiac Enzymes: No results for input(s): CKTOTAL, CKMB, CKMBINDEX, TROPONINI in the last 168 hours.  BNP: Invalid input(s): POCBNP  CBG: Recent Labs  Lab 05/27/24 1152 05/27/24 1537 05/27/24 2217 05/29/24 0730 05/30/24 0836  GLUCAP 98 178* 196* 97 79    Microbiology: Results for orders placed or performed in visit on 05/24/24  Microscopic Examination     Status: Abnormal   Collection Time: 05/24/24 10:07 AM   Urine  Result Value Ref Range Status   WBC, UA >30 (A) 0 - 5 /hpf Final   RBC, Urine 11-30 (A) 0 - 2 /hpf Final   Epithelial Cells (non renal) 0-10 0 - 10 /hpf Final   Bacteria, UA Many (A) None seen/Few Final    Coagulation Studies: No results for input(s): LABPROT, INR in the last 72 hours.   Urinalysis: No results for input(s): COLORURINE, LABSPEC, PHURINE, GLUCOSEU, HGBUR, BILIRUBINUR, KETONESUR, PROTEINUR, UROBILINOGEN, NITRITE,  LEUKOCYTESUR in the last 72 hours.  Invalid input(s): APPERANCEUR     Imaging: No results found.    Medications:     amLODipine   10 mg Oral Daily   amoxicillin -clavulanate  1 tablet Oral Daily   atorvastatin   20 mg Oral Daily   calcium  acetate  1,334 mg Oral TID WC   carvedilol   12.5 mg Oral BID WC   Chlorhexidine  Gluconate Cloth  6 each Topical Q0600   cloNIDine   0.1 mg Oral BID   feeding supplement (NEPRO CARB STEADY)  237 mL Oral BID BM   folic acid   1 mg Oral Daily   heparin  injection (subcutaneous)  5,000 Units Subcutaneous Q8H   hydrOXYzine   10 mg Oral QHS   irbesartan   300 mg Oral Daily   levothyroxine   125 mcg Oral Q0600   lipase/protease/amylase  12,000 Units Oral TID WC   LORazepam   0.5 mg Intravenous Once   multivitamin  1 tablet Oral QHS   oxybutynin   5 mg Oral TID   pantoprazole   40 mg Oral Daily   zinc  sulfate (50mg  elemental zinc )  220 mg Oral Daily   acetaminophen  **OR** acetaminophen , albuterol , hydrALAZINE , magnesium  hydroxide, melatonin, ondansetron  **OR** ondansetron  (ZOFRAN ) IV, traMADol   Assessment/ Plan:  Tonya Myers is a 59 y.o.  female past medical history of end-stage renal disease on hemodialysis Monday Wednesday Friday schedule, ANCA related glomerulonephritis, diet-controlled diabetes, orthostatic hypotension and thrombocytopenia and CLL. She presents to ED with weakness over the past month and has been admitted for Hyperkalemia [E87.5] Weakness [R53.1] Hemodialysis patient [Z99.2]  [facility] [place]/MWF/AVF  Hyperkalemia with end stage renal disease on hemodialysis. Potassium 6.2, on Ed arrival. Potassium corrected. Receiving dialysis today to maintain outpatient schedule. Next treatment scheduled for Monday.   2. Anemia of chronic kidney disease Lab Results  Component Value Date   HGB 8.2 (L) 06/01/2024    Hgb reduced, will order low dose retacrit  4000 units IV during dialysis today..   3. Weakness, right sided.  Previous MRI brain/head/neck negative. CT head shows abnormal soft tissue  in right medial canthal region concerning for malignancy. Less likely infection. Brain MRI and MRA neck/head negative. Continue PT/OT, symptoms have resolved.   4. Secondary Hyperparathyroidism: with outpatient labs: PTH 133, phosphorus 4.1, calcium  9.2 on 04/30/24.   Lab Results  Component Value Date   CALCIUM  8.9 06/01/2024   PHOS 5.1 (H) 06/01/2024    Phosphorus has improved during this admission. Continue calcium  acetate with meals.   5.  hypertension with chronic kidney disease.  Currently uncontrolled with amlodipine , carvedilol , and irbesartan .  Started hydralazine  50 mg 3 times daily yesterday evening.  Added clonidine  0.1 mg twice daily. Blood pressure 99/51 during dialysis  LOS: 7 Yury Schaus 2/6/202612:22 PM   "

## 2024-06-01 NOTE — TOC Progression Note (Signed)
 Transition of Care [facility]) - Progression Note    Patient Details  Name: Tonya Myers MRN: [id number] Date of Birth: Jun 15, 1965  Transition of Care [facility]) [name] Contact  Grayce JAYSON Perfect, RN Phone Number: 06/01/2024, 3:08 PM  Clinical Narrative:   RNCM contacted [facility], they have auth and can take patient today.  Patient in dialysis most of day.  MD notified and will discharge after dialysis, [facility] notified of this. Patient aware that she needs ride and reported yesterday that friend would take her there. She is refusing a taxi. Does not want to pay for [facility], she can walk and safe to discharge via car.     Expected Discharge Plan: Home/Self Care Barriers to Discharge: Continued Medical Work up               Expected Discharge Plan and Services In-house Referral: Clinical Social Work       Expected Discharge Date: 05/31/24                                     Social Drivers of Health (SDOH) Interventions SDOH Screenings   Food Insecurity: No Food Insecurity (05/25/2024)  Housing: Low Risk (05/25/2024)  Transportation Needs: Unmet Transportation Needs (05/25/2024)  Utilities: At Risk (05/25/2024)  Depression (PHQ2-9): Low Risk (12/06/2023)  Financial Resource Strain: High Risk (07/06/2023)   Received from [facility]  Social Connections: Unknown (08/12/2023)  Tobacco Use: Low Risk (05/24/2024)    Readmission Risk Interventions    12/24/2021   12:03 PM  Readmission Risk Prevention Plan  Transportation Screening Complete  Medication Review (RN Care Manager) Complete  PCP or Specialist appointment within 3-5 days of discharge Complete  HRI or Home Care Consult Complete  SW Recovery Care/Counseling Consult Complete  Palliative Care Screening Not Applicable  Skilled Nursing Facility Complete

## 2024-06-01 NOTE — Progress Notes (Signed)
 OT Cancellation Note  Patient Details Name: Tonya Myers MRN: [id number] DOB: [birth year]   Cancelled Treatment:    Reason Eval/Treat Not Completed: Patient at procedure or test/ unavailable. Pt at dialysis this morning. Per chart, plan to d/c home later today. Will re-attempt as appropriate.   Rogers Ditter R., MPH, MS, OTR/L ascom [phone] 06/01/24, 10:08 AM

## 2024-06-01 NOTE — Discharge Summary (Signed)
 "  Physician Discharge Summary   Tonya Myers  female DOB: Dec 01, 1965  FMW:[id number]  PCP: Valora Lynwood FALCON, MD  Admit date: 05/24/2024 Discharge date: discharge order entered on 05/31/24 per CWD, pt left the hospital on 06/01/24.  Admitted From: home Disposition:  SNF rehab Home Health: Yes CODE STATUS: Full code  Discharge Instructions     Diet renal with fluid restriction   Complete by: As directed       Hospital Course:  For full details, please see H&P, progress notes, consult notes and ancillary notes.  Briefly,  Tonya Myers is a 59 y.o. African-American female with medical history significant for end-stage renal disease on hemodialysis on Monday Wednesday and Fridays, asthma, CLL, diabetes mellitus, essential hypertension and hypothyroidism, who presented to the emergency room with acute onset of right-sided weakness concerning for CVA.    Pt was just in the ED on 05/23/24 for similar right-sided weakness.  MRI showed no acute changes. MRA showed no evidence of LVO.  Neuro consulted with Dr. Germaine at that time and did not suspect an acute ischemic event.   She continued to have right sided upper and lower extremity weakness and has been having trouble caring for herself.     Right sided weakness, improved - She just had a negative brain MRI and MRA of the head and neck.  After presentation, pt had an episode during which she was unable to move her whole body.  Dr. Germaine evaluated pt and rec CT head only, which showed no acute intracranial abnormality.   Weakness improved during hospitalization. --PT/OT, SNF rehab   * Hyperkalemia - This is associated with end-stage renal disease on HD, and missing dialysis. --management with HD  Acute lower UTI, ruled out --d/c'ed abx   Chronic dysuria --has outpatient cystoscopy scheduled in Feb --avoid pyridium  due to ESRD status --started on oxybutynin  with improvement in dysuria.   Hypertensive  urgency --BP difficult to control, often high despite having received all her scheduled BP meds.  Clonidine  was added, however, that dropped BP to 100's. --amlodipine  increased from 5 to 10 mg daily --cont home coreg , telmisartan    Dyslipidemia --cont home lipitor   Hypothyroidism - continue Synthroid .   GERD without esophagitis - continue PPI therapy   Right medial canthus subcutaneous mass  --with bone erosion, worrisome for lacrimal duct malignancy  --no visible sign outside --ENT evaluated, nothing on nasal exam  --outpatient ophthal f/u already scheduled   Chronic sinusitis --started on abx, per ENT rec --cont Augmentin  for 7-day course   Unless noted above, medications under STOP list are ones pt was not taking PTA.  Discharge Diagnoses:  Principal Problem:   Hyperkalemia Active Problems:   Right sided weakness   Acute lower UTI   Hypertensive urgency   Hypothyroidism   Dyslipidemia   GERD without esophagitis   30 Day Unplanned Readmission Risk Score    Flowsheet Row ED to Hosp-Admission (Current) from 05/24/2024 in [facility] 1C MEDICAL TELEMETRY  30 Day Unplanned Readmission Risk Score (%) 50.62 Filed at 06/01/2024 0400    This score is the patient's risk of an unplanned readmission within 30 days of being discharged (0 -100%). The score is based on dignosis, age, lab data, medications, orders, and past utilization.   Low:  0-14.9   Medium: 15-21.9   High: 22-29.9   Extreme: 30 and above         Discharge Instructions:  Allergies as of 06/01/2024  Reactions   Amoxapine    Other Reaction(s): Unknown   Latex Other (See Comments)   Ceftazidime    Other reaction(s): Confusion, Hallucination, Unknown Encephalopathy - improved after dialysis/some concern for cephalosporin neurotoxicity Tolerated without confusion 06/2021. Encephalopathy - improved after dialysis/some concern for cephalosporin neurotoxicity Tolerated without  confusion 06/2021.   Iodinated Contrast Media Hives, Rash   Other Reaction(s): Hives   Penicillins    Other Reaction(s): Unknown        Medication List     STOP taking these medications    azithromycin  250 MG tablet Commonly known as: Zithromax    Blood Glucose Monitoring Suppl Devi   multivitamin Tabs tablet       TAKE these medications    acetaminophen  325 MG tablet Commonly known as: TYLENOL  Take 2 tablets (650 mg total) by mouth every 6 (six) hours as needed for mild pain (or Fever >/= 101).   albuterol  108 (90 Base) MCG/ACT inhaler Commonly known as: VENTOLIN  HFA Inhale 2 puffs into the lungs every 6 (six) hours as needed for wheezing or shortness of breath.   amitriptyline  25 MG tablet Commonly known as: ELAVIL  Take 1 tablet (25 mg total) by mouth at bedtime.   amLODipine  10 MG tablet Commonly known as: NORVASC  Take 1 tablet (10 mg total) by mouth daily. Increased from 5 mg. What changed:  medication strength how much to take additional instructions   amoxicillin -clavulanate 250-125 MG tablet Commonly known as: AUGMENTIN  Take 1 tablet by mouth daily for 5 days.   atorvastatin  20 MG tablet Commonly known as: LIPITOR Take 20 mg by mouth daily.   BLOOD GLUCOSE TEST STRIPS Strp 1 each by Does not apply route 3 (three) times daily. Use as directed to check blood sugar. May dispense any manufacturer covered by patient's insurance and fits patient's device.   calcium  acetate 667 MG capsule Commonly known as: PHOSLO  Take 2 capsules (1,334 mg total) by mouth 3 (three) times daily with meals.   carvedilol  12.5 MG tablet Commonly known as: COREG  Take 1 tablet (12.5 mg total) by mouth 2 (two) times daily with a meal.   cetirizine  10 MG tablet Commonly known as: ZYRTEC  Take 1 tablet (10 mg total) by mouth daily as needed for allergies. Home med. What changed:  when to take this reasons to take this additional instructions   clotrimazole -betamethasone   cream Commonly known as: LOTRISONE  Apply 1 Application topically daily as needed. Home med. What changed:  when to take this reasons to take this additional instructions   Creon  12000-38000 units Cpep capsule Generic drug: lipase/protease/amylase Take 1 capsule by mouth 3 (three) times daily with meals.   fluticasone  50 MCG/ACT nasal spray Commonly known as: FLONASE  Place 2 sprays into both nostrils daily as needed for allergies or rhinitis. Home med. What changed:  when to take this reasons to take this additional instructions   folic acid  1 MG tablet Commonly known as: FOLVITE  Take 1 tablet by mouth daily.   guaiFENesin -dextromethorphan  100-10 MG/5ML syrup Commonly known as: ROBITUSSIN DM Take 5 mLs by mouth every 4 (four) hours as needed for cough.   hydrOXYzine  10 MG tablet Commonly known as: ATARAX  Take 10 mg by mouth at bedtime.   hydrOXYzine  25 MG tablet Commonly known as: ATARAX  Take 1 tablet (25 mg total) by mouth 3 (three) times daily as needed for itching.   Lancet Device Misc 1 each by Does not apply route 3 (three) times daily. May dispense any manufacturer covered by patient's insurance.  Lancets Misc 1 each by Does not apply route 3 (three) times daily. Use as directed to check blood sugar. May dispense any manufacturer covered by patient's insurance and fits patient's device.   levalbuterol  1.25 MG/3ML nebulizer solution Commonly known as: XOPENEX  Inhale 1.25 mg into the lungs every 8 (eight) hours as needed for wheezing. Home med What changed:  when to take this reasons to take this additional instructions   levothyroxine  125 MCG tablet Commonly known as: SYNTHROID  Take 125 mcg by mouth daily. Take 1 tablet daily except Sunday take 1 and 1/2 tablet (187.5 mg).   lidocaine  5 % ointment Commonly known as: XYLOCAINE  Apply 1 Application topically 3 (three) times daily as needed for moderate pain (pain score 4-6) or mild pain (pain score 1-3) (to  vaginal area).   melatonin 3 MG Tabs tablet Take 2 tablets (6 mg total) by mouth at bedtime as needed. Home med. What changed:  when to take this reasons to take this additional instructions   ondansetron  8 MG disintegrating tablet Commonly known as: ZOFRAN -ODT TAKE 1 TABLET BY MOUTH EVERY 8 HOURS AS NEEDED FOR NAUSEA OR VOMITING.   oxybutynin  5 MG tablet Commonly known as: DITROPAN  Take 1 tablet (5 mg total) by mouth 3 (three) times daily.   pantoprazole  40 MG tablet Commonly known as: Protonix  1 tablet (40 mg total) daily.  Home med. What changed: See the new instructions.   telmisartan  80 MG tablet Commonly known as: MICARDIS  Take 1 tablet (80 mg total) by mouth daily.   traMADol  50 MG tablet Commonly known as: ULTRAM  Take 1 tablet (50 mg total) by mouth at bedtime.   valACYclovir  500 MG tablet Commonly known as: VALTREX  Take 500 mg by mouth as directed. Take 1 tablet daily on dialysis days.(Monday, Wednesday and Friday)   Vitamin D-1000 Max St 25 MCG (1000 UT) tablet Generic drug: Cholecalciferol Take 25 mcg by mouth.   zinc  sulfate (50mg  elemental zinc ) 220 (50 Zn) MG capsule Take 1 capsule (220 mg total) by mouth daily.         Follow-up Information     Valora Lynwood FALCON, MD Follow up.   Specialty: Family Medicine Why: hospital follow up Contact information: [address] [facility] [place] [address] [phone]                 Allergies[1]   The results of significant diagnostics from this hospitalization (including imaging, microbiology, ancillary and laboratory) are listed below for reference.   Consultations:   Procedures/Studies: CT HEAD WO CONTRAST ( ) Result Date: 05/25/2024 EXAM: CT HEAD WITHOUT CONTRAST 05/25/2024 01:02:29 PM TECHNIQUE: CT of the head was performed without the administration of intravenous contrast. Automated exposure control, iterative reconstruction, and/or weight based adjustment of the mA/kV  was utilized to reduce the radiation dose to as low as reasonably achievable. COMPARISON: Head CT and MRI 05/23/2024. CLINICAL HISTORY: Generalized weakness; code stroke called. FINDINGS: BRAIN AND VENTRICLES: There is no evidence of an acute infarct, intracranial hemorrhage, mass, midline shift, hydrocephalus, or extra-axial fluid collection. There is mild cerebral atrophy. Hypodensities in the cerebral white matter bilaterally are unchanged and nonspecific but compatible with mild chronic small vessel ischemic disease. Calcified atherosclerosis at the skull base. ORBITS: No acute abnormality. SINUSES: Moderate right frontal and right ethmoid sinus mucosal thickening. Partially visualized mild mucosal thickening in the right maxillary sinus. Clear mastoid air cells. SOFT TISSUES AND SKULL: There is abnormal soft tissue in the right medial canthal region with  underlying bone erosion (series 2 image 6), new from 12/24/2023. No acute skull fracture. IMPRESSION: 1. Abnormal soft tissue in the right medial canthal region with underlying bone erosion, concerning for malignancy or less likely infection/osteomyelitis. Correlate with direct examination and consider tissue sampling. 2. No acute intracranial abnormality. 3. Mild chronic small vessel ischemic disease. 4. Cerebral Atrophy (ICD10-G31.9). Electronically signed by: Dasie Hamburg MD 05/25/2024 03:07 PM EST RP Workstation: [id number]   US  EKG SITE RITE Result Date: 05/25/2024 If Site Rite image not attached, placement could not be confirmed due to current cardiac rhythm.  MR ANGIO HEAD WO CONTRAST Addendum Date: 05/23/2024 ADDENDUM #1 ADDENDUM: An MRI of the brain without contrast was also performed. There is no restricted diffusion to indicate acute or recent infarction. There is mild periventricular white matter disease present. The study is degraded by patient motion. There is no evidence of hemorrhage, mass or hydrocephalus. There is mucosal disease present  within the right frontal and ethmoid sinuses. ---------------------------------------------------- Electronically signed by: [name] MD 05/23/2024 11:34 AM EST RP Workstation: [id number]   Result Date: 05/23/2024  ORIGINAL REPORT EXAM: MR Angiography Head without intravenous Contrast. 05/23/2024 09:59:18 AM TECHNIQUE: Magnetic resonance angiography images of the head without intravenous contrast. Multiplanar 2D and 3D reformatted images are provided for review. COMPARISON: CT of the head dated 05/23/2024. CLINICAL HISTORY: Neuro deficit, acute, stroke suspected. FINDINGS: LIMITATIONS: The study is degraded by patient motion. ANTERIOR CIRCULATION: No significant stenosis of the internal carotid arteries. The right A1 segment is hypoplastic. The A2 segments appeared diminutive approximately, likely secondary to motion artifact. No significant stenosis of the middle cerebral arteries. No apparent aneurysm. No large vessel occlusion. POSTERIOR CIRCULATION: No significant stenosis of the posterior cerebral arteries. No significant stenosis of the basilar artery. No significant stenosis of the vertebral arteries. No aneurysm. IMPRESSION: 1. No apparent aneurysm or large vessel occlusion; no significant stenosis of the intracranial vasculature on this motion-degraded exam. 2. Hypoplastic right A1 segment; A2 segments appear diminutive, likely secondary to motion artifact. Electronically signed by: [name] MD 05/23/2024 10:42 AM EST RP Workstation: [id number]   MR BRAIN WO CONTRAST Addendum Date: 05/23/2024 ADDENDUM #1  ADDENDUM: An MRI of the brain without contrast was also performed. There is no restricted diffusion to indicate acute or recent infarction. There is mild periventricular white matter disease present. The study is degraded by patient motion. There is no evidence of hemorrhage, mass or hydrocephalus. There is mucosal disease present within the right frontal and ethmoid sinuses.  ---------------------------------------------------- Electronically signed by: [name] MD 05/23/2024 11:34 AM EST RP Workstation: [id number]   Result Date: 05/23/2024 ORIGINAL REPORT  EXAM: MR Angiography Head without intravenous Contrast. 05/23/2024 09:59:18 AM TECHNIQUE: Magnetic resonance angiography images of the head without intravenous contrast. Multiplanar 2D and 3D reformatted images are provided for review. COMPARISON: CT of the head dated 05/23/2024. CLINICAL HISTORY: Neuro deficit, acute, stroke suspected. FINDINGS: LIMITATIONS: The study is degraded by patient motion. ANTERIOR CIRCULATION: No significant stenosis of the internal carotid arteries. The right A1 segment is hypoplastic. The A2 segments appeared diminutive approximately, likely secondary to motion artifact. No significant stenosis of the middle cerebral arteries. No apparent aneurysm. No large vessel occlusion. POSTERIOR CIRCULATION: No significant stenosis of the posterior cerebral arteries. No significant stenosis of the basilar artery. No significant stenosis of the vertebral arteries. No aneurysm. IMPRESSION: 1. No apparent aneurysm or large vessel occlusion; no significant stenosis of the intracranial vasculature on this motion-degraded exam. 2. Hypoplastic right A1 segment; A2  segments appear diminutive, likely secondary to motion artifact. Electronically signed by: [name] MD 05/23/2024 10:42 AM EST RP Workstation: [id number]   CT HEAD CODE STROKE WO CONTRAST Result Date: 05/23/2024 EXAM: CT HEAD WITHOUT CONTRAST 05/23/2024 09:10:54 AM TECHNIQUE: CT of the head was performed without the administration of intravenous contrast. Automated exposure control, iterative reconstruction, and/or weight based adjustment of the mA/kV was utilized to reduce the radiation dose to as low as reasonably achievable. COMPARISON: CT of the head dated 12/24/2023. CLINICAL HISTORY: Neuro deficit, acute, stroke suspected. FINDINGS:  BRAIN AND VENTRICLES: No acute hemorrhage. No evidence of acute infarct. No hydrocephalus. No extra-axial collection. No mass effect or midline shift. Mild cerebral white matter disease. [place] Stroke Program Early CT Score (ASPECTS) ----- Ganglionic (caudate, IC, lentiform nucleus, insula, M1-M3): 7 Supraganglionic (M4-M6): 3 Total: 10 Findings were communicated to Doctor Germaine at 09:16 AM 05/23/2024. ORBITS: No acute abnormality. SINUSES: No acute abnormality. SOFT TISSUES AND SKULL: No acute soft tissue abnormality. No skull fracture. IMPRESSION: 1. No acute intracranial abnormality related to the suspected stroke. 2. Mild cerebral white matter disease. 3. Findings communicated to Dr. Germaine at 09:16 AM on 05/23/24; Aspects: 10. Electronically signed by: [name] MD 05/23/2024 09:18 AM EST RP Workstation: [id number]   Ultrasound renal complete Result Date: 05/10/2024 EXAM: RETROPERITONEAL ULTRASOUND OF THE KIDNEYS 05/10/2024 10:57:59 AM TECHNIQUE: Real-time ultrasonography of the retroperitoneum, specifically the kidneys and urinary bladder, was performed. COMPARISON: US  Renal 12/01/2020, 02/08/2024 CLINICAL HISTORY: Hydronephrosis and bladder distention. FINDINGS: RIGHT KIDNEY: Right kidney measures 7.0 x 3.6 x 3.2 cm. Normal cortical echogenicity. No hydronephrosis. No calculus. No mass. Right ureteral jet is not visualized. LEFT KIDNEY: Left kidney measures 7.9 x 4.0 x 3.7 cm. Normal cortical echogenicity. No hydronephrosis. No calculus. Left interpolar region cyst measuring 1.1 x 1.1 x 1.6 cm. Left ureteral jet is not visualized. BLADDER: Circumferential wall thickening of the urinary bladder, which is hyperemic. Mass like hypoechogenic material within the bladder lumen. IMPRESSION: 1. Hyperemic circumferential urinary bladder wall thickening with intraluminal mass-like hypoechoic material, worrisome for acute cystitis. The mass-like hypoechogenic material in the urinary bladder may represent  blood clot, infectious debris, or underlying bladder neoplasm. 2. No hydronephrosis or nephrolithiasis. Electronically signed by: Rogelia Myers MD 05/10/2024 11:38 AM EST RP Workstation: [id number]      Labs: BNP (last 3 results) No results for input(s): BNP in the last 8760 hours. Basic Metabolic Panel: Recent Labs  Lab 05/25/24 1600 05/26/24 0436 05/28/24 0730 05/29/24 0500 05/30/24 1104 05/31/24 0800 06/01/24 0859  NA 138 139 139  --   --  137 136  K 5.6* 4.8 5.2* 5.8* 4.7 5.3* 4.8  CL 99 97* 98  --   --  96* 96*  CO2 22 28 25   --   --  26 28  GLUCOSE 106* 134* 96  --   --  105* 176*  BUN 72* 44* 74*  --   --  64* 48*  CREATININE 8.27* 6.08* 8.76*  --   --  7.92* 5.69*  CALCIUM  9.7 9.6 9.3  --   --  9.0 8.9  MG  --  2.1  --   --   --   --   --   PHOS 6.1*  --  6.7*  --   --  6.2* 5.1*   Liver Function Tests: Recent Labs  Lab 05/25/24 1600 05/28/24 0730 05/31/24 0800 06/01/24 0859  ALBUMIN  3.7 3.4* 3.4* 3.4*   No results for input(s): LIPASE, AMYLASE  in the last 168 hours. No results for input(s): AMMONIA in the last 168 hours. CBC: Recent Labs  Lab 05/25/24 1600 05/28/24 0730 05/31/24 0800 06/01/24 0859  WBC 6.2 4.5 4.2 4.5  NEUTROABS  --  2.3 1.9  --   HGB 11.0* 8.3* 8.1* 8.2*  HCT 33.0* 26.6* 25.4* 26.6*  MCV 97.9 100.4* 102.0* 103.5*  PLT 174 114* 117* 117*   Cardiac Enzymes: No results for input(s): CKTOTAL, CKMB, CKMBINDEX, TROPONINI in the last 168 hours. BNP: Invalid input(s): POCBNP CBG: Recent Labs  Lab 05/27/24 1152 05/27/24 1537 05/27/24 2217 05/29/24 0730 05/30/24 0836  GLUCAP 98 178* 196* 97 79   D-Dimer No results for input(s): DDIMER in the last 72 hours. Hgb A1c No results for input(s): HGBA1C in the last 72 hours. Lipid Profile No results for input(s): CHOL, HDL, LDLCALC, TRIG, CHOLHDL, LDLDIRECT in the last 72 hours. Thyroid  function studies No results for input(s): TSH, T4TOTAL,  T3FREE, THYROIDAB in the last 72 hours.  Invalid input(s): FREET3 Anemia work up No results for input(s): VITAMINB12, FOLATE, FERRITIN, TIBC, IRON, RETICCTPCT in the last 72 hours. Urinalysis    Component Value Date/Time   COLORURINE AMBER (A) 12/24/2023 1557   APPEARANCEUR Cloudy (A) 05/24/2024 1007   LABSPEC 1.019 12/24/2023 1557   PHURINE 7.0 12/24/2023 1557   GLUCOSEU Negative 05/24/2024 1007   HGBUR SMALL (A) 12/24/2023 1557   BILIRUBINUR Negative 05/24/2024 1007   KETONESUR NEGATIVE 12/24/2023 1557   PROTEINUR 3+ (A) 05/24/2024 1007   PROTEINUR 100 (A) 12/24/2023 1557   NITRITE Negative 05/24/2024 1007   NITRITE NEGATIVE 12/24/2023 1557   LEUKOCYTESUR 3+ (A) 05/24/2024 1007   LEUKOCYTESUR MODERATE (A) 12/24/2023 1557   Sepsis Labs Recent Labs  Lab 05/25/24 1600 05/28/24 0730 05/31/24 0800 06/01/24 0859  WBC 6.2 4.5 4.2 4.5   Microbiology Recent Results (from the past 240 hours)  Microscopic Examination     Status: Abnormal   Collection Time: 05/24/24 10:07 AM   Urine  Result Value Ref Range Status   WBC, UA >30 (A) 0 - 5 /hpf Final   RBC, Urine 11-30 (A) 0 - 2 /hpf Final   Epithelial Cells (non renal) 0-10 0 - 10 /hpf Final   Bacteria, UA Many (A) None seen/Few Final     Total time spend on discharging this patient, including the last patient exam, discussing the hospital stay, instructions for ongoing care as it relates to all pertinent caregivers, as well as preparing the medical discharge records, prescriptions, and/or referrals as applicable, is 50 minutes.    [name], MD  [facility] 06/01/2024, 3:02 PM       [1]  Allergies Allergen Reactions   Amoxapine     Other Reaction(s): Unknown   Latex Other (See Comments)   Ceftazidime     Other reaction(s): Confusion, Hallucination, Unknown Encephalopathy - improved after dialysis/some concern for cephalosporin neurotoxicity Tolerated without confusion  06/2021. Encephalopathy - improved after dialysis/some concern for cephalosporin neurotoxicity Tolerated without confusion 06/2021.    Iodinated Contrast Media Hives and Rash    Other Reaction(s): Hives   Penicillins     Other Reaction(s): Unknown   "

## 2024-06-01 NOTE — Progress Notes (Signed)
 Hemodialysis Note:  Received patient in bed to unit. Alert and oriented. Informed consent singed and in chart.  Treatment initiated: 0915 Treatment completed: 1233  Access used: Right AVF Access issues: None  Patient tolerated well. Transported back to room, alert without acute distress. Report given to patient's RN.  Total UF removed: 1 Liter Medications given: None  Post HD weight: 79.8 Kg  [name]

## 2024-06-14 ENCOUNTER — Ambulatory Visit: Admitting: Urology

## 2024-09-04 ENCOUNTER — Inpatient Hospital Stay: Admitting: Nurse Practitioner

## 2024-09-04 ENCOUNTER — Inpatient Hospital Stay
